# Patient Record
Sex: Female | Born: 1935 | Race: White | Hispanic: No | Smoking: Never smoker
Health system: Southern US, Community
[De-identification: ages and names within clinical notes are randomized; demographics above are authoritative.]

## PROBLEM LIST (undated history)

## (undated) DIAGNOSIS — I739 Peripheral vascular disease, unspecified: Secondary | ICD-10-CM

## (undated) DIAGNOSIS — R42 Dizziness and giddiness: Secondary | ICD-10-CM

## (undated) DIAGNOSIS — K219 Gastro-esophageal reflux disease without esophagitis: Secondary | ICD-10-CM

## (undated) DIAGNOSIS — I8390 Asymptomatic varicose veins of unspecified lower extremity: Secondary | ICD-10-CM

## (undated) DIAGNOSIS — I639 Cerebral infarction, unspecified: Secondary | ICD-10-CM

## (undated) DIAGNOSIS — H9190 Unspecified hearing loss, unspecified ear: Secondary | ICD-10-CM

## (undated) DIAGNOSIS — J189 Pneumonia, unspecified organism: Secondary | ICD-10-CM

## (undated) DIAGNOSIS — I499 Cardiac arrhythmia, unspecified: Secondary | ICD-10-CM

## (undated) DIAGNOSIS — D649 Anemia, unspecified: Secondary | ICD-10-CM

## (undated) DIAGNOSIS — Z950 Presence of cardiac pacemaker: Secondary | ICD-10-CM

## (undated) DIAGNOSIS — G629 Polyneuropathy, unspecified: Secondary | ICD-10-CM

## (undated) DIAGNOSIS — I4891 Unspecified atrial fibrillation: Secondary | ICD-10-CM

## (undated) DIAGNOSIS — C4491 Basal cell carcinoma of skin, unspecified: Secondary | ICD-10-CM

## (undated) DIAGNOSIS — E039 Hypothyroidism, unspecified: Secondary | ICD-10-CM

## (undated) HISTORY — PX: CHOLECYSTECTOMY: SHX55

## (undated) HISTORY — PX: PACEMAKER PLACEMENT: SHX43

## (undated) HISTORY — PX: APPENDECTOMY: SHX54

## (undated) HISTORY — PX: EYE SURGERY: SHX253

## (undated) HISTORY — PX: BREAST CYST EXCISION: SHX579

## (undated) HISTORY — PX: BASAL CELL CARCINOMA EXCISION: SHX1214

## (undated) HISTORY — DX: Peripheral vascular disease, unspecified: I73.9

## (undated) HISTORY — DX: Asymptomatic varicose veins of unspecified lower extremity: I83.90

## (undated) HISTORY — PX: CATARACT EXTRACTION: SUR2

## (undated) HISTORY — PX: TONSILLECTOMY: SUR1361

---

## 1998-12-04 DIAGNOSIS — I639 Cerebral infarction, unspecified: Secondary | ICD-10-CM

## 1998-12-04 HISTORY — DX: Cerebral infarction, unspecified: I63.9

## 2004-12-22 ENCOUNTER — Ambulatory Visit: Payer: Self-pay | Admitting: Otolaryngology

## 2005-03-13 ENCOUNTER — Ambulatory Visit: Payer: Self-pay | Admitting: Neurology

## 2005-05-12 ENCOUNTER — Ambulatory Visit: Payer: Self-pay | Admitting: Internal Medicine

## 2005-05-19 ENCOUNTER — Ambulatory Visit: Payer: Self-pay | Admitting: Internal Medicine

## 2005-05-30 ENCOUNTER — Ambulatory Visit: Payer: Self-pay | Admitting: Internal Medicine

## 2005-05-31 ENCOUNTER — Inpatient Hospital Stay: Payer: Self-pay | Admitting: Internal Medicine

## 2006-05-22 ENCOUNTER — Ambulatory Visit: Payer: Self-pay | Admitting: Internal Medicine

## 2007-01-18 ENCOUNTER — Ambulatory Visit: Payer: Self-pay | Admitting: Neurology

## 2007-02-04 ENCOUNTER — Ambulatory Visit: Payer: Self-pay | Admitting: Otolaryngology

## 2007-02-04 ENCOUNTER — Other Ambulatory Visit: Payer: Self-pay

## 2007-02-07 ENCOUNTER — Ambulatory Visit: Payer: Self-pay | Admitting: Otolaryngology

## 2007-05-28 ENCOUNTER — Ambulatory Visit: Payer: Self-pay | Admitting: Internal Medicine

## 2008-05-28 ENCOUNTER — Ambulatory Visit: Payer: Self-pay | Admitting: Internal Medicine

## 2008-06-15 ENCOUNTER — Ambulatory Visit: Payer: Self-pay | Admitting: Internal Medicine

## 2008-08-20 ENCOUNTER — Observation Stay: Payer: Self-pay | Admitting: Internal Medicine

## 2008-08-20 ENCOUNTER — Other Ambulatory Visit: Payer: Self-pay

## 2008-08-26 ENCOUNTER — Emergency Department: Payer: Self-pay | Admitting: Emergency Medicine

## 2008-08-27 ENCOUNTER — Encounter: Admission: RE | Admit: 2008-08-27 | Discharge: 2008-08-27 | Payer: Self-pay | Admitting: Neurology

## 2008-12-04 DIAGNOSIS — J189 Pneumonia, unspecified organism: Secondary | ICD-10-CM

## 2008-12-04 DIAGNOSIS — I499 Cardiac arrhythmia, unspecified: Secondary | ICD-10-CM

## 2008-12-04 HISTORY — DX: Cardiac arrhythmia, unspecified: I49.9

## 2008-12-04 HISTORY — DX: Pneumonia, unspecified organism: J18.9

## 2009-01-19 ENCOUNTER — Encounter: Admission: RE | Admit: 2009-01-19 | Discharge: 2009-01-19 | Payer: Self-pay | Admitting: Neurology

## 2009-06-30 ENCOUNTER — Ambulatory Visit: Payer: Self-pay | Admitting: Internal Medicine

## 2009-07-02 ENCOUNTER — Ambulatory Visit: Payer: Self-pay | Admitting: Cardiology

## 2009-07-03 ENCOUNTER — Ambulatory Visit: Payer: Self-pay | Admitting: Neurology

## 2009-07-07 ENCOUNTER — Ambulatory Visit: Payer: Self-pay | Admitting: Cardiology

## 2009-07-14 ENCOUNTER — Inpatient Hospital Stay: Payer: Self-pay | Admitting: Cardiology

## 2009-08-04 ENCOUNTER — Encounter: Admission: RE | Admit: 2009-08-04 | Discharge: 2009-08-04 | Payer: Self-pay | Admitting: Neurology

## 2009-09-02 ENCOUNTER — Encounter: Admission: RE | Admit: 2009-09-02 | Discharge: 2009-09-02 | Payer: Self-pay | Admitting: Neurology

## 2009-09-20 ENCOUNTER — Encounter: Admission: RE | Admit: 2009-09-20 | Discharge: 2009-09-20 | Payer: Self-pay | Admitting: Neurology

## 2010-01-07 ENCOUNTER — Encounter: Admission: RE | Admit: 2010-01-07 | Discharge: 2010-01-07 | Payer: Self-pay | Admitting: Neurology

## 2010-03-16 ENCOUNTER — Encounter: Admission: RE | Admit: 2010-03-16 | Discharge: 2010-03-16 | Payer: Self-pay | Admitting: Neurology

## 2010-03-30 ENCOUNTER — Encounter: Admission: RE | Admit: 2010-03-30 | Discharge: 2010-03-30 | Payer: Self-pay | Admitting: Neurology

## 2010-07-05 ENCOUNTER — Ambulatory Visit: Payer: Self-pay | Admitting: Internal Medicine

## 2010-08-02 ENCOUNTER — Ambulatory Visit: Payer: Self-pay | Admitting: Ophthalmology

## 2010-08-17 ENCOUNTER — Ambulatory Visit: Payer: Self-pay | Admitting: Ophthalmology

## 2010-09-23 ENCOUNTER — Ambulatory Visit: Payer: Self-pay | Admitting: Neurology

## 2010-10-12 ENCOUNTER — Ambulatory Visit: Payer: Self-pay | Admitting: Internal Medicine

## 2010-11-16 ENCOUNTER — Ambulatory Visit: Payer: Self-pay | Admitting: Ophthalmology

## 2011-07-07 ENCOUNTER — Ambulatory Visit: Payer: Self-pay | Admitting: Internal Medicine

## 2011-10-04 ENCOUNTER — Other Ambulatory Visit: Payer: Self-pay | Admitting: Neurology

## 2011-10-04 DIAGNOSIS — D329 Benign neoplasm of meninges, unspecified: Secondary | ICD-10-CM

## 2011-10-06 ENCOUNTER — Ambulatory Visit
Admission: RE | Admit: 2011-10-06 | Discharge: 2011-10-06 | Disposition: A | Discharge status: Home / Self Care | Payer: Medicare Other | Source: Ambulatory Visit | Attending: Neurology | Admitting: Neurology

## 2011-10-06 DIAGNOSIS — D329 Benign neoplasm of meninges, unspecified: Secondary | ICD-10-CM

## 2011-10-06 MED ORDER — IOHEXOL 300 MG/ML  SOLN
75.0000 mL | Freq: Once | INTRAMUSCULAR | Status: AC | PRN
  Administered 2011-10-06: 75 mL via INTRAVENOUS

## 2012-02-01 ENCOUNTER — Other Ambulatory Visit: Payer: Self-pay | Admitting: Neurology

## 2012-02-01 DIAGNOSIS — G519 Disorder of facial nerve, unspecified: Secondary | ICD-10-CM

## 2012-02-09 ENCOUNTER — Ambulatory Visit
Admission: RE | Admit: 2012-02-09 | Discharge: 2012-02-09 | Disposition: A | Discharge status: Home / Self Care | Payer: Medicare Other | Source: Ambulatory Visit | Attending: Neurology | Admitting: Neurology

## 2012-02-09 DIAGNOSIS — G519 Disorder of facial nerve, unspecified: Secondary | ICD-10-CM

## 2012-03-22 ENCOUNTER — Emergency Department: Payer: Self-pay | Admitting: Internal Medicine

## 2012-03-22 LAB — COMPREHENSIVE METABOLIC PANEL
Albumin: 3.4 g/dL (ref 3.4–5.0)
Alkaline Phosphatase: 96 U/L (ref 50–136)
Anion Gap: 11 (ref 7–16)
BUN: 26 mg/dL — ABNORMAL HIGH (ref 7–18)
Bilirubin,Total: 0.4 mg/dL (ref 0.2–1.0)
Chloride: 101 mmol/L (ref 98–107)
Co2: 25 mmol/L (ref 21–32)
Creatinine: 1.23 mg/dL (ref 0.60–1.30)
EGFR (Non-African Amer.): 40 — ABNORMAL LOW
Glucose: 104 mg/dL — ABNORMAL HIGH (ref 65–99)
Potassium: 4.3 mmol/L (ref 3.5–5.1)
SGOT(AST): 35 U/L (ref 15–37)
Total Protein: 7.9 g/dL (ref 6.4–8.2)

## 2012-03-22 LAB — URINALYSIS, COMPLETE
Bilirubin,UR: NEGATIVE
Glucose,UR: NEGATIVE mg/dL (ref 0–75)
Nitrite: NEGATIVE
Protein: 30
Specific Gravity: 1.035 (ref 1.003–1.030)
Squamous Epithelial: 2
WBC UR: 3 /HPF (ref 0–5)

## 2012-03-22 LAB — CBC
HGB: 14.8 g/dL (ref 12.0–16.0)
MCH: 32 pg (ref 26.0–34.0)
MCHC: 32.8 g/dL (ref 32.0–36.0)
MCV: 98 fL (ref 80–100)
Platelet: 306 10*3/uL (ref 150–440)
RBC: 4.61 10*6/uL (ref 3.80–5.20)
WBC: 8.9 10*3/uL (ref 3.6–11.0)

## 2012-07-08 ENCOUNTER — Ambulatory Visit: Payer: Self-pay | Admitting: Internal Medicine

## 2012-08-12 ENCOUNTER — Observation Stay: Payer: Self-pay

## 2012-08-12 LAB — CBC
HCT: 41.5 % (ref 35.0–47.0)
MCHC: 34.4 g/dL (ref 32.0–36.0)
RDW: 13.6 % (ref 11.5–14.5)
WBC: 7 10*3/uL (ref 3.6–11.0)

## 2012-08-12 LAB — COMPREHENSIVE METABOLIC PANEL
Alkaline Phosphatase: 114 U/L (ref 50–136)
Anion Gap: 5 — ABNORMAL LOW (ref 7–16)
Calcium, Total: 10.1 mg/dL (ref 8.5–10.1)
Co2: 28 mmol/L (ref 21–32)
EGFR (Non-African Amer.): 58 — ABNORMAL LOW
Osmolality: 276 (ref 275–301)
Potassium: 4.3 mmol/L (ref 3.5–5.1)
SGPT (ALT): 24 U/L (ref 12–78)
Sodium: 137 mmol/L (ref 136–145)

## 2012-08-12 LAB — CK TOTAL AND CKMB (NOT AT ARMC)
CK, Total: 44 U/L (ref 21–215)
CK-MB: 0.8 ng/mL (ref 0.5–3.6)
CK-MB: 0.8 ng/mL (ref 0.5–3.6)

## 2012-08-12 LAB — URINALYSIS, COMPLETE
Bacteria: NONE SEEN
Bilirubin,UR: NEGATIVE
Blood: NEGATIVE
Glucose,UR: NEGATIVE mg/dL (ref 0–75)
Protein: NEGATIVE
Specific Gravity: 1.008 (ref 1.003–1.030)
Squamous Epithelial: <1
WBC UR: <1 /HPF (ref 0–5)

## 2012-08-12 LAB — PROTIME-INR
INR: 0.8
Prothrombin Time: 11.9 secs (ref 11.5–14.7)

## 2012-08-12 LAB — TROPONIN I: Troponin-I: <0.02 ng/mL

## 2012-08-13 LAB — BASIC METABOLIC PANEL
Anion Gap: 11 (ref 7–16)
BUN: 18 mg/dL (ref 7–18)
Co2: 24 mmol/L (ref 21–32)
Creatinine: 0.96 mg/dL (ref 0.60–1.30)
EGFR (African American): >60
EGFR (Non-African Amer.): 54 — ABNORMAL LOW
Sodium: 144 mmol/L (ref 136–145)

## 2012-08-13 LAB — LIPID PANEL
HDL Cholesterol: 36 mg/dL — ABNORMAL LOW (ref 40–60)
Ldl Cholesterol, Calc: 155 mg/dL — ABNORMAL HIGH (ref 0–100)
VLDL Cholesterol, Calc: 65 mg/dL — ABNORMAL HIGH (ref 5–40)

## 2012-08-13 LAB — CBC WITH DIFFERENTIAL/PLATELET
Basophil %: 0.7 %
Eosinophil #: 0.3 10*3/uL (ref 0.0–0.7)
HCT: 39.6 % (ref 35.0–47.0)
HGB: 13.6 g/dL (ref 12.0–16.0)
Lymphocyte #: 1.5 10*3/uL (ref 1.0–3.6)
Lymphocyte %: 30.3 %
MCH: 32.1 pg (ref 26.0–34.0)
MCHC: 34.4 g/dL (ref 32.0–36.0)
MCV: 93 fL (ref 80–100)
Monocyte #: 0.8 x10 3/mm (ref 0.2–0.9)
Monocyte %: 15.6 %
Neutrophil #: 2.5 10*3/uL (ref 1.4–6.5)
Neutrophil %: 48.4 %

## 2012-08-13 LAB — TROPONIN I: Troponin-I: <0.02 ng/mL

## 2012-08-13 LAB — CK TOTAL AND CKMB (NOT AT ARMC)
CK, Total: 36 U/L (ref 21–215)
CK-MB: <0.5 ng/mL — ABNORMAL LOW (ref 0.5–3.6)

## 2012-08-30 ENCOUNTER — Inpatient Hospital Stay: Payer: Self-pay

## 2012-08-30 LAB — CK TOTAL AND CKMB (NOT AT ARMC)
CK, Total: 466 U/L — ABNORMAL HIGH (ref 21–215)
CK, Total: 471 U/L — ABNORMAL HIGH (ref 21–215)
CK-MB: 6.4 ng/mL — ABNORMAL HIGH (ref 0.5–3.6)
CK-MB: 4.9 ng/mL — ABNORMAL HIGH (ref 0.5–3.6)

## 2012-08-30 LAB — CBC WITH DIFFERENTIAL/PLATELET
Basophil %: 0.3 %
Eosinophil %: 0 %
HGB: 12.8 g/dL (ref 12.0–16.0)
Lymphocyte #: 1.4 10*3/uL (ref 1.0–3.6)
Lymphocyte %: 8.5 %
MCH: 30.8 pg (ref 26.0–34.0)
Monocyte #: 2.2 x10 3/mm — ABNORMAL HIGH (ref 0.2–0.9)
Monocyte %: 12.8 %
Neutrophil %: 78.4 %
Platelet: 265 10*3/uL (ref 150–440)
RBC: 4.16 10*6/uL (ref 3.80–5.20)
WBC: 16.9 10*3/uL — ABNORMAL HIGH (ref 3.6–11.0)

## 2012-08-30 LAB — URINALYSIS, COMPLETE
Bacteria: NONE SEEN
Blood: NEGATIVE
Glucose,UR: NEGATIVE mg/dL (ref 0–75)
Ketone: NEGATIVE
Leukocyte Esterase: NEGATIVE
Nitrite: NEGATIVE
Protein: NEGATIVE
RBC,UR: 3 /HPF (ref 0–5)
Specific Gravity: 1.008 (ref 1.003–1.030)
Squamous Epithelial: <1
WBC UR: 1 /HPF (ref 0–5)

## 2012-08-30 LAB — COMPREHENSIVE METABOLIC PANEL
Anion Gap: 6 — ABNORMAL LOW (ref 7–16)
BUN: 21 mg/dL — ABNORMAL HIGH (ref 7–18)
Bilirubin,Total: 0.7 mg/dL (ref 0.2–1.0)
Chloride: 105 mmol/L (ref 98–107)
Creatinine: 1.18 mg/dL (ref 0.60–1.30)
EGFR (African American): 48 — ABNORMAL LOW
Osmolality: 284 (ref 275–301)
Potassium: 4.6 mmol/L (ref 3.5–5.1)
SGOT(AST): 31 U/L (ref 15–37)
Sodium: 140 mmol/L (ref 136–145)
Total Protein: 7.6 g/dL (ref 6.4–8.2)

## 2012-08-30 LAB — PROTIME-INR
INR: 1
Prothrombin Time: 13.6 secs (ref 11.5–14.7)

## 2012-08-30 LAB — TSH: Thyroid Stimulating Horm: 0.974 u[IU]/mL

## 2012-08-31 LAB — CBC WITH DIFFERENTIAL/PLATELET
Basophil #: 0.1 10*3/uL (ref 0.0–0.1)
Basophil %: 0.6 %
Eosinophil #: 0.1 10*3/uL (ref 0.0–0.7)
HGB: 11.1 g/dL — ABNORMAL LOW (ref 12.0–16.0)
Lymphocyte #: 1.7 10*3/uL (ref 1.0–3.6)
Lymphocyte %: 12.6 %
MCHC: 32.3 g/dL (ref 32.0–36.0)
Monocyte #: 1.5 x10 3/mm — ABNORMAL HIGH (ref 0.2–0.9)
Neutrophil %: 75 %

## 2012-08-31 LAB — BASIC METABOLIC PANEL
BUN: 19 mg/dL — ABNORMAL HIGH (ref 7–18)
Calcium, Total: 7.9 mg/dL — ABNORMAL LOW (ref 8.5–10.1)
Co2: 23 mmol/L (ref 21–32)
Osmolality: 285 (ref 275–301)
Potassium: 4.1 mmol/L (ref 3.5–5.1)
Sodium: 141 mmol/L (ref 136–145)

## 2012-08-31 LAB — APTT: Activated PTT: 96.6 secs — ABNORMAL HIGH (ref 23.6–35.9)

## 2012-08-31 LAB — CK TOTAL AND CKMB (NOT AT ARMC)
CK, Total: 318 U/L — ABNORMAL HIGH (ref 21–215)
CK-MB: 1.9 ng/mL (ref 0.5–3.6)

## 2012-09-01 LAB — BASIC METABOLIC PANEL
Chloride: 113 mmol/L — ABNORMAL HIGH (ref 98–107)
EGFR (Non-African Amer.): 60 — ABNORMAL LOW
Glucose: 111 mg/dL — ABNORMAL HIGH (ref 65–99)
Osmolality: 290 (ref 275–301)
Potassium: 4 mmol/L (ref 3.5–5.1)
Sodium: 146 mmol/L — ABNORMAL HIGH (ref 136–145)

## 2012-09-01 LAB — CBC WITH DIFFERENTIAL/PLATELET
Basophil #: 0.1 10*3/uL (ref 0.0–0.1)
Basophil %: 0.7 %
Eosinophil %: 1.6 %
HGB: 11.5 g/dL — ABNORMAL LOW (ref 12.0–16.0)
Lymphocyte #: 1.2 10*3/uL (ref 1.0–3.6)
MCH: 32.9 pg (ref 26.0–34.0)
MCV: 95 fL (ref 80–100)
Monocyte #: 1.3 x10 3/mm — ABNORMAL HIGH (ref 0.2–0.9)
Monocyte %: 13.2 %
Neutrophil %: 71.8 %
RBC: 3.48 10*6/uL — ABNORMAL LOW (ref 3.80–5.20)

## 2012-09-02 LAB — BASIC METABOLIC PANEL
Anion Gap: 10 (ref 7–16)
BUN: 10 mg/dL (ref 7–18)
Chloride: 110 mmol/L — ABNORMAL HIGH (ref 98–107)
Creatinine: 0.84 mg/dL (ref 0.60–1.30)
EGFR (African American): >60
EGFR (Non-African Amer.): >60
Glucose: 105 mg/dL — ABNORMAL HIGH (ref 65–99)
Osmolality: 286 (ref 275–301)

## 2012-09-02 LAB — CBC WITH DIFFERENTIAL/PLATELET
Basophil %: 0.6 %
Eosinophil %: 3 %
HCT: 35.3 % (ref 35.0–47.0)
Lymphocyte #: 1.1 10*3/uL (ref 1.0–3.6)
MCH: 32.2 pg (ref 26.0–34.0)
Monocyte #: 1.2 x10 3/mm — ABNORMAL HIGH (ref 0.2–0.9)
Monocyte %: 13 %
Platelet: 291 10*3/uL (ref 150–440)
WBC: 9.3 10*3/uL (ref 3.6–11.0)

## 2012-12-16 LAB — CBC
HGB: 13.5 g/dL (ref 12.0–16.0)
MCHC: 32.3 g/dL (ref 32.0–36.0)
MCV: 95 fL (ref 80–100)
Platelet: 329 10*3/uL (ref 150–440)
RBC: 4.43 10*6/uL (ref 3.80–5.20)
RDW: 14.1 % (ref 11.5–14.5)
WBC: 16.8 10*3/uL — ABNORMAL HIGH (ref 3.6–11.0)

## 2012-12-16 LAB — TROPONIN I: Troponin-I: <0.02 ng/mL

## 2012-12-16 LAB — COMPREHENSIVE METABOLIC PANEL
Alkaline Phosphatase: 127 U/L (ref 50–136)
Anion Gap: 9 (ref 7–16)
Calcium, Total: 9.4 mg/dL (ref 8.5–10.1)
Chloride: 106 mmol/L (ref 98–107)
Creatinine: 0.98 mg/dL (ref 0.60–1.30)
Glucose: 189 mg/dL — ABNORMAL HIGH (ref 65–99)
Potassium: 5.4 mmol/L — ABNORMAL HIGH (ref 3.5–5.1)
SGOT(AST): 54 U/L — ABNORMAL HIGH (ref 15–37)
SGPT (ALT): 27 U/L (ref 12–78)
Sodium: 138 mmol/L (ref 136–145)
Total Protein: 8.2 g/dL (ref 6.4–8.2)

## 2012-12-16 LAB — CK TOTAL AND CKMB (NOT AT ARMC)
CK, Total: 91 U/L (ref 21–215)
CK-MB: <0.5 ng/mL — ABNORMAL LOW (ref 0.5–3.6)

## 2012-12-17 ENCOUNTER — Inpatient Hospital Stay: Payer: Self-pay

## 2012-12-17 LAB — TROPONIN I
Troponin-I: <0.02 ng/mL
Troponin-I: <0.02 ng/mL

## 2012-12-17 LAB — PRO B NATRIURETIC PEPTIDE: B-Type Natriuretic Peptide: 5288 pg/mL — ABNORMAL HIGH (ref 0–450)

## 2012-12-17 LAB — POTASSIUM: Potassium: 5 mmol/L (ref 3.5–5.1)

## 2012-12-18 LAB — BASIC METABOLIC PANEL
BUN: 25 mg/dL — ABNORMAL HIGH (ref 7–18)
Calcium, Total: 8.4 mg/dL — ABNORMAL LOW (ref 8.5–10.1)
Chloride: 103 mmol/L (ref 98–107)
Co2: 26 mmol/L (ref 21–32)
Potassium: 4.3 mmol/L (ref 3.5–5.1)
Sodium: 136 mmol/L (ref 136–145)

## 2012-12-18 LAB — CBC WITH DIFFERENTIAL/PLATELET
Eosinophil %: 0.5 %
HGB: 11.9 g/dL — ABNORMAL LOW (ref 12.0–16.0)
Lymphocyte #: 1.5 10*3/uL (ref 1.0–3.6)
Lymphocyte %: 9.8 %
MCHC: 34.6 g/dL (ref 32.0–36.0)
MCV: 95 fL (ref 80–100)
Monocyte #: 1.8 x10 3/mm — ABNORMAL HIGH (ref 0.2–0.9)
Monocyte %: 11.9 %
Neutrophil #: 11.9 10*3/uL — ABNORMAL HIGH (ref 1.4–6.5)
Platelet: 258 10*3/uL (ref 150–440)
RBC: 3.61 10*6/uL — ABNORMAL LOW (ref 3.80–5.20)
RDW: 14 % (ref 11.5–14.5)
WBC: 15.4 10*3/uL — ABNORMAL HIGH (ref 3.6–11.0)

## 2012-12-20 LAB — BASIC METABOLIC PANEL
Anion Gap: 6 — ABNORMAL LOW (ref 7–16)
BUN: 22 mg/dL — ABNORMAL HIGH (ref 7–18)
Calcium, Total: 8.7 mg/dL (ref 8.5–10.1)
Co2: 26 mmol/L (ref 21–32)
EGFR (Non-African Amer.): 55 — ABNORMAL LOW

## 2012-12-20 LAB — CBC WITH DIFFERENTIAL/PLATELET
Eosinophil %: 3.8 %
HGB: 11.6 g/dL — ABNORMAL LOW (ref 12.0–16.0)
MCV: 95 fL (ref 80–100)
Monocyte %: 15 %
Neutrophil #: 5.8 10*3/uL (ref 1.4–6.5)
Neutrophil %: 66.8 %
Platelet: 280 10*3/uL (ref 150–440)
RDW: 13.8 % (ref 11.5–14.5)

## 2012-12-21 LAB — BASIC METABOLIC PANEL
Creatinine: 0.87 mg/dL (ref 0.60–1.30)
Glucose: 103 mg/dL — ABNORMAL HIGH (ref 65–99)
Osmolality: 281 (ref 275–301)
Sodium: 140 mmol/L (ref 136–145)

## 2012-12-21 LAB — CBC WITH DIFFERENTIAL/PLATELET
Basophil #: 0.1 10*3/uL (ref 0.0–0.1)
Basophil %: 1.4 %
Eosinophil #: 0.3 10*3/uL (ref 0.0–0.7)
Eosinophil %: 4.3 %
HGB: 10.9 g/dL — ABNORMAL LOW (ref 12.0–16.0)
Lymphocyte #: 1.4 10*3/uL (ref 1.0–3.6)
Lymphocyte %: 20.1 %
MCH: 29.8 pg (ref 26.0–34.0)
MCHC: 31.5 g/dL — ABNORMAL LOW (ref 32.0–36.0)
MCV: 95 fL (ref 80–100)
Monocyte %: 15.7 %
Neutrophil %: 58.5 %
Platelet: 303 10*3/uL (ref 150–440)
WBC: 7.1 10*3/uL (ref 3.6–11.0)

## 2012-12-22 LAB — CULTURE, BLOOD (SINGLE)

## 2012-12-23 LAB — BASIC METABOLIC PANEL
Anion Gap: 7 (ref 7–16)
Creatinine: 0.99 mg/dL (ref 0.60–1.30)
EGFR (African American): 59 — ABNORMAL LOW
Glucose: 128 mg/dL — ABNORMAL HIGH (ref 65–99)
Osmolality: 278 (ref 275–301)
Sodium: 137 mmol/L (ref 136–145)

## 2012-12-25 LAB — CBC WITH DIFFERENTIAL/PLATELET
Basophil #: 0.1 10*3/uL (ref 0.0–0.1)
Eosinophil %: 2.7 %
HCT: 34.3 % — ABNORMAL LOW (ref 35.0–47.0)
HGB: 11.4 g/dL — ABNORMAL LOW (ref 12.0–16.0)
Lymphocyte #: 1.3 10*3/uL (ref 1.0–3.6)
Lymphocyte %: 13.8 %
MCHC: 33.2 g/dL (ref 32.0–36.0)
MCV: 94 fL (ref 80–100)
Monocyte %: 11.1 %
Neutrophil #: 6.5 10*3/uL (ref 1.4–6.5)
Neutrophil %: 70.9 %
Platelet: 450 10*3/uL — ABNORMAL HIGH (ref 150–440)
RBC: 3.67 10*6/uL — ABNORMAL LOW (ref 3.80–5.20)
RDW: 14.1 % (ref 11.5–14.5)
WBC: 9.2 10*3/uL (ref 3.6–11.0)

## 2012-12-25 LAB — BASIC METABOLIC PANEL
Anion Gap: 10 (ref 7–16)
Co2: 23 mmol/L (ref 21–32)
Creatinine: 1.24 mg/dL (ref 0.60–1.30)
EGFR (African American): 45 — ABNORMAL LOW
EGFR (Non-African Amer.): 39 — ABNORMAL LOW
Osmolality: 283 (ref 275–301)
Potassium: 3.7 mmol/L (ref 3.5–5.1)
Sodium: 137 mmol/L (ref 136–145)

## 2012-12-30 ENCOUNTER — Other Ambulatory Visit: Payer: Self-pay

## 2012-12-30 LAB — PRO B NATRIURETIC PEPTIDE: B-Type Natriuretic Peptide: 7984 pg/mL — ABNORMAL HIGH (ref 0–450)

## 2013-06-20 ENCOUNTER — Ambulatory Visit: Payer: Self-pay

## 2013-07-09 ENCOUNTER — Ambulatory Visit: Payer: Self-pay

## 2013-12-04 HISTORY — PX: ANGIOPLASTY: SHX39

## 2014-01-07 ENCOUNTER — Ambulatory Visit: Payer: Self-pay

## 2014-04-28 ENCOUNTER — Ambulatory Visit (INDEPENDENT_AMBULATORY_CARE_PROVIDER_SITE_OTHER): Payer: 59 | Admitting: Podiatry

## 2014-04-28 ENCOUNTER — Ambulatory Visit (INDEPENDENT_AMBULATORY_CARE_PROVIDER_SITE_OTHER): Payer: 59

## 2014-04-28 ENCOUNTER — Encounter: Payer: Self-pay | Admitting: Podiatry

## 2014-04-28 VITALS — BP 125/77 | HR 87 | Resp 16 | Ht 64.0 in | Wt 154.0 lb

## 2014-04-28 DIAGNOSIS — G589 Mononeuropathy, unspecified: Secondary | ICD-10-CM

## 2014-04-28 DIAGNOSIS — G629 Polyneuropathy, unspecified: Secondary | ICD-10-CM

## 2014-04-28 DIAGNOSIS — L84 Corns and callosities: Secondary | ICD-10-CM

## 2014-04-28 DIAGNOSIS — M7750 Other enthesopathy of unspecified foot: Secondary | ICD-10-CM

## 2014-04-28 DIAGNOSIS — M898X9 Other specified disorders of bone, unspecified site: Secondary | ICD-10-CM

## 2014-04-28 NOTE — Progress Notes (Signed)
   Subjective:    Patient ID: [name], female    DOB: 01-02-1923, 80 y.o.   MRN: [id number]  HPI Comments: Both of my fee feel like rubber. It hurts to bend them. This has been going on for 2-3 yrs. It came up slowly and getting worse. It bothered me in the night time. i soak in epsom salt.   Foot Pain Associated symptoms include fatigue, nausea, numbness and a rash.      Review of Systems  Constitutional: Positive for fatigue.  HENT: Positive for hearing loss and trouble swallowing.   Eyes: Positive for redness.  Respiratory: Positive for shortness of breath.        Difficulty breathing  Cardiovascular: Positive for leg swelling.       Calf pain with walking  Gastrointestinal: Positive for nausea and constipation.  Endocrine:       Increase urination  Genitourinary: Positive for frequency.  Musculoskeletal:       Back pain Difficulty walking Muscle pain  Skin: Positive for rash.       Thick scars  Neurological: Positive for dizziness and numbness.  Hematological: Bruises/bleeds easily.  Psychiatric/Behavioral: Positive for confusion.  All other systems reviewed and are negative.      Objective:   Physical Exam        Assessment & Plan:

## 2014-04-28 NOTE — Progress Notes (Signed)
Subjective:     Patient ID: [name], female   DOB: [birth year], 80 y.o.   MRN: [id number]  Foot Pain   patient states I have a rubbery feeling in the bottom of both my feet and my legs to get tired and at nighttime they hurt me quite a bit if I'm on them too much. This is been getting gradually worse over the last couple years and I do see a vascular doctor for migraines   Review of Systems  All other systems reviewed and are negative.      Objective:   Physical Exam  Nursing note and vitals reviewed. Constitutional: She is oriented to person, place, and time.  Musculoskeletal: Normal range of motion.  Neurological: She is oriented to person, place, and time.  Skin: Skin is warm.   I noted diminishment of vascular status the DP and PT pulses of both feet and it neurological intact but diminished Sharp toe and vibratory. Muscle strength was found to be diminished and range of motion was adequate with depression of the arch of both feet and a keratotic lesion it's painful inside of the fifth toe left foot that she cannot take care of herself     Assessment:     Possibility for vascular disease versus low-grade neuropathic symptoms and exostosis with pain and callus formation fifth left    Plan:     H&P and x-rays reviewed. Debridement of lesion on left with padding and instructed on possibility for vascular evaluation if symptoms were to be persistent. She is to see the vascular doctor soon and promises she will have him check her circulation. I offered to make her an appointment and she states she wants to take care of it herself

## 2014-05-13 ENCOUNTER — Encounter: Payer: Self-pay | Admitting: *Deleted

## 2014-07-21 ENCOUNTER — Ambulatory Visit: Payer: Self-pay

## 2014-11-29 DIAGNOSIS — S6990XA Unspecified injury of unspecified wrist, hand and finger(s), initial encounter: Secondary | ICD-10-CM | POA: Insufficient documentation

## 2014-12-30 DIAGNOSIS — I480 Paroxysmal atrial fibrillation: Secondary | ICD-10-CM | POA: Insufficient documentation

## 2015-03-23 NOTE — Consult Note (Signed)
PATIENT NAME:  [name], [name] MR#:  [id number] DATE OF BIRTH:  27-May-1923  DATE OF CONSULTATION:  08/30/2012  REFERRING PHYSICIAN: Dr Ola Spurr.   CONSULTING PHYSICIAN: Corey Skains, MD PRIMARY CARE PHYSICIAN: Dr Ola Spurr  REASON FOR CONSULTATION: Atrial fibrillation with rapid ventricular rate, hypertension, hyperlipidemia, previous stroke, and heart block in the past, needing further medical management and treatment options.   CHIEF COMPLAINT: "I had a stroke."   HISTORY OF PRESENT ILLNESS: This is an 81 year old female with known complete heart block in the past, status post pacemaker placement who has had  showing no evidence of significant need in adjustments for which the patient has done fairly well. The patient has had new onset of pneumonia. She had shortness of breath, weakness, and loss of her voice. With that she has had atrial fibrillation with rapid ventricular rate and lower blood pressure. Now she is having some shortness of breath with this issue but no evidence of significant chest discomfort. The patient does have a stroke as of 2 weeks ago, possibly consistent with a thrombotic stroke on aspirin. The patient also has hyperlipidemia on appropriate medication management. She has had hypertension, on beta-blocker in the past.   REVIEW OF SYSTEMS: The remainder of her review of systems negative for vision change, ringing in the ears, hearing loss, cough, congestion, heartburn, nausea, vomiting, diarrhea, bloody stools, stomach pain, extremity pain, leg weakness, cramping in the buttocks, known blood clots, headaches, blackouts, dizzy spells, nosebleeds, congestion, trouble swallowing, frequent urination, urination at night, muscle weakness, numbness, anxiety, depression, skin lesions, or skin rashes.   PAST MEDICAL HISTORY:  1. Transient ischemic attack and stroke.  2. Hyperlipidemia.  3. Heart block.  4. Hypertension.  5. Atrial fibrillation.   FAMILY HISTORY: No  family members with early onset of cardiovascular disease or hypertension.   SOCIAL HISTORY: Currently denies alcohol or tobacco use.   ALLERGIES: She has no known drug allergies.   CURRENT MEDICATIONS: As listed.   PHYSICAL EXAMINATION:  VITAL SIGNS: Blood pressure is 132/68 bilaterally, heart rate 88 upright, reclining, and irregular.   GENERAL: She is a well-appearing female in no acute distress.   HEENT: No icterus, thyromegaly, ulcers, hemorrhage, or xanthelasma.   HEART: Irregularly irregular with normal S1 and S2 without murmur, gallop, or rub. PMI is diffuse. Carotid upstroke normal without bruit. Jugular venous pressure is normal.   LUNGS: Lungs have few basilar crackles with normal respirations.   ABDOMEN: Soft, nontender, without hepatosplenomegaly or masses. Abdominal aorta is normal size without bruit.   EXTREMITIES: Show 2+ radial, femoral, dorsal pedal pulses with no lower extremity edema, cyanosis, clubbing, or ulcers.   NEUROLOGIC: She is oriented to time, place, and person with normal mood and affect.   ASSESSMENT: An 81 year old female with hypertension, hyperlipidemia, heart block, previous transient ischemic attack/stroke with acute onset of atrial fibrillation with rapid ventricular rate needing further medical management and treatment options.   RECOMMENDATIONS:  1. Continue heart rate control with beta-blocker Cardizem combination for further risk reduction in tachycardia and congestive heart failure.  2. Anticoagulation with heparin and further anticoagulation with oral agent thereafter due to high CHADS score.  3. Hypertension, controlled with beta-blocker.  4. Echocardiogram for LV systolic dysfunction, valvular heart disease.  5. Hyperlipidemia with treatment options with a goal LDL below 70 mg/dL.  6. Further treatment options after above.     ____________________________ Corey Skains, MD bjk:vtd D: 08/30/2012 17:56:41 ET T: 08/31/2012  09:26:47 ET JOB#: [id number]  cc:  Corey Skains, MD, <Dictator> Corey Skains MD ELECTRONICALLY SIGNED 09/04/2012 8:46

## 2015-03-23 NOTE — Discharge Summary (Signed)
PATIENT NAME:  [name], [name] MR#:  [id number] DATE OF BIRTH:  1923-03-07  DATE OF ADMISSION:  08/30/2012 DATE OF DISCHARGE:  09/02/2012  PRIMARY CARE PHYSICIAN: Adrian Prows, MD  CARDIOLOGIST: Isaias Cowman, MD  DISCHARGE DIAGNOSES:  1. Right middle lobe pneumonia.  2. Atrial fibrillation with rapid ventricular response.  3. Hypertension.  4. Recent transient ischemic attack.  5. Sick sinus syndrome status post pacemaker placement in the past.  6. Thyroid nodule being worked up by Dr. Gabriel Carina.   HISTORY OF PRESENT ILLNESS: Please see full note from 08/30/2012.  Briefly, she is 65 and had a recent admission for transient ischemic attack, presenting with left-sided hand weakness. She had no evidence of atrial fibrillation during that admission. However, she was complaining of palpitations as an outpatient. She had her pacemaker adjusted several days prior to admission. On the day of admission, she had had one to two days of palpitations with her heart rate up to the 140s. She felt very lightheaded and had coolness and clamminess. She also had a cough and sore throat and hoarse voice for one to two days and fevers. She was found to be in atrial fibrillation with rapid ventricular response and to have a right middle lobe pneumonia on her chest x-ray. She was admitted for IV antibiotics and rate control.   HOSPITAL COURSE:  1. Right middle lobe pneumonia. She was treated with ceftriaxone and azithromycin. On admission she had a white count that is elevated, however, it has improved. She has been afebrile since admission. She will be transitioned to levofloxacin to complete a total of a 10 day course.  2. Atrial fibrillation with rapid ventricular response in a patient with underlying sick sinus syndrome and a pacemaker in place. She was continued on her oral metoprolol and added Cardizem. Dr. Nehemiah Massed saw her for cardiology. She had had a recent echocardiogram several weeks ago for  evaluation of her transient ischemic attack. She was started on heparin and then Pradaxa for stroke prevention as her CHADS score was 4. She was decreased from 325 mg of an aspirin to 81 mg a day. She will follow up with Dr. Saralyn Pilar in cardiology.  3. Thyroid nodule. This was to be biopsied this week by Dr. Gabriel Carina; however, given the acute illness we will defer this for several weeks. At the time of biopsy, she will need to have her Pradaxa held for three days.   DISCHARGE MEDICATIONS.  1. Levofloxacin 500 mg once a day for six days.  2. Metoprolol 25 mg twice a day.  3. Cardizem CD 240 once a day.  4. Pradaxa 150 mg twice a day.  5. Aspirin 81 mg once a day.  6. Tussionex 5 mL every 12 hours p.r.n. cough.  7. Tylenol p.r.n.  8. Fluticasone nasal spray once a day.  9. Synthroid 50 mcg once a day.  10. Calcium with vitamin D one tablet twice a day.  11. Multivitamin.  12. Glucosamine.  13. PreserVision oral tablets.  14. Fish oil.  15. Magnesium gluconate.  16. Tramadol 50 mg as needed.  17. Losartan 50 mg once a day.   DISCHARGE DIET: Cardiac diet, regular consistency.   DISCHARGE ACTIVITY: As tolerated.   DISCHARGE FOLLOWUP/INSTRUCTIONS: The patient is to followup within 7 to 10 days with Dr. Ola Spurr and later this week with Dr. Saralyn Pilar. She is to return if she develops any         new symptoms or especially recurrent palpitations or fevers. She  will need a follow-up chest x-ray in two to three weeks to ensure resolution of the abnormalities.  TIME SPENT: 38 minutes. ____________________________ Cheral Marker. Ola Spurr, MD dpf:slb D: 09/02/2012 15:24:22 ET T: 09/03/2012 11:03:50 ET JOB#: [id number]  cc: Cheral Marker. Ola Spurr, MD, <Dictator> Isaias Cowman, MD Davis MD ELECTRONICALLY SIGNED 09/10/2012 21:44

## 2015-03-23 NOTE — H&P (Signed)
PATIENT NAME:  [name], [name] MR#:  [id number] DATE OF BIRTH:  [birth year]  DATE OF ADMISSION:  08/12/2012  ADMITTING PHYSICIAN: Dr. Gladstone Lighter  PRIMARY CARE PHYSICIAN: Dr. Andrey Farmer and she will be seeing Dr. Ola Spurr as Dr. Mable Fill has retired.    CHIEF COMPLAINT: Left arm weakness.   HISTORY OF PRESENT ILLNESS: [name] is a very pleasant 81 year old Caucasian female with past medical history significant for hypertension, hyperlipidemia, sick sinus syndrome status post dual-chamber pacemaker placement and known history of meningioma presents to the hospital secondary to left arm weakness that started this morning. Patient was in her normal state of health this morning and was trying to sort out some letters and she couldn't use her left arm to hold onto the papers and was concerned so woke her husband up and they came to the ER. She took aspirin and brought to the hospital. Her symptoms have resolved and currently her strength is normal in all four extremities. According to family she also had mild left facial droop when the symptoms started this morning which now resolved too. Patient has not had prior episodes of transient ischemic attack or stroke other than she had right ear hearing loss which was secondary to arterial blood clot. She denies any chest pain, nausea, vomiting, slurred speech or any other symptoms associated with that.   PAST MEDICAL HISTORY:  1. Hypertension.  2. Vertigo syndrome.  3. Hyperlipidemia, not on medications. 4. 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  5. Osteoporosis.  6. Chronic allergies.  7. Spinal stenosis.  8. Sick sinus syndrome status post pacemaker.  9. Postherpetic neuralgia.   10. Meningioma.   PAST SURGICAL HISTORY:  1. Cataract surgery.  2. Appendectomy.  3. Cholecystectomy.  4. Tonsillectomy.  5. Biopsy of breast cyst.  6. Left upper and lower eyelid plastic surgery.  7. Skin cancer removed from nose.   ALLERGIES TO MEDICATIONS:  Sulfa. She is also intolerant to Flagyl, Monistat.   CURRENT MEDICATIONS AT HOME:  1. Aspirin 81 mg p.o. daily.  2. Calcium plus vitamin D 600 mg/200 international units 1 tablet p.o. b.i.d.  3. Fish oil 1000 mg p.o. t.i.d.  4. Flonase 50 mcg one spray daily.  5. Glucosamine 500 mg p.o. daily.  6. Losartan 50 mg p.o. daily.  7. Magnesium gluconate 250 mg p.o. b.i.d.  8. Metoprolol 25 mg p.o. b.i.d.  9. Multivitamin 1 tablet p.o. daily.  10. PreserVision 1 tablet p.o. b.i.d.  11. Imdur 50 mcg p.o. daily.  12. Tramadol 25 mg p.o. every six hours p.r.n.  13. Tylenol 500 mg q. 6-8 hours p.r.n. for pain.    SOCIAL HISTORY: Lives at home with husband. Used to do clerical work, currently retired. No smoking or alcohol use.   FAMILY HISTORY: Mom with atherosclerosis, died in her late 21s and father had lung cancer, died in late 13s.    REVIEW OF SYSTEMS: CONSTITUTIONAL: No fever, fatigue, or weakness. EYES: No blurred vision, double history. History of cataract status post surgery. No inflammation or glaucoma. ENT: No tinnitus, ear pain. Positive for right ear hearing loss after arterial clot. No epistaxis or discharge. RESPIRATORY: No cough, wheeze, hemoptysis, or chronic obstructive pulmonary disease. CARDIOVASCULAR: No chest pain, orthopnea, edema, arrhythmia, palpitations or syncope. GASTROINTESTINAL: No nausea, vomiting, diarrhea, abdominal pain, hematemesis or melena. GENITOURINARY: Increased frequency of urination. No dysuria, hematuria, or incontinence. ENDOCRINE: No polyuria, nocturia, thyroid problems, heat or cold intolerance. HEMATOLOGY: No anemia, easy bruising or bleeding. SKIN: No acne,  rash, or lesions. MUSCULOSKELETAL: No neck, back, shoulder pain, arthritis or gout. NEUROLOGIC: Positive for transient ischemic attack. No CVA, tremors. Also vascular history of vertigo. PSYCH: No anxiety, insomnia, or depression.  PHYSICAL EXAMINATION:  VITAL SIGNS: Temperature 96.7 degrees Fahrenheit,  pulse 86, respirations 18, blood pressure 212/102, pulse oximetry 95% on room air.   GENERAL: Well built, well nourished female lying in bed, not in any acute distress.   HEENT: Normocephalic, atraumatic. Pupils equal, round, reacting to light. There are post surgical pupils. Anicteric sclera. Extraocular movements intact. Oropharynx clear. No erythema, mass or exudates.   NECK: Supple. No thyromegaly, JVD, or carotid bruits. No lymphadenopathy.   LUNGS: Clear to auscultation bilaterally. No wheeze or crackles. No use of accessory muscles for breathing.   CARDIOVASCULAR: S1, S2 regular rate and rhythm. No murmurs, rubs, or gallops.   ABDOMEN: Soft, nontender, nondistended. No hepatosplenomegaly. Normal bowel sounds.   EXTREMITIES: No pedal edema. No clubbing or cyanosis. 2+ dorsalis pedis pulses palpable bilaterally.    SKIN: No acne, rash, or lesions.   LYMPHATICS: No cervical or inguinal lymphadenopathy.   NEUROLOGIC: Cranial nerves intact. No focal motor or sensory deficits. Strength is 5/5 in all four extremities. There is no facial droop and normal sensation in all four extremities, reflexes, knee jerk, biceps are 1+. Plantars are downgoing.   LABORATORY, DIAGNOSTIC AND RADIOLOGICAL DATA:  WBC 7.0, hemoglobin 14.3, hematocrit 41.5, platelet count 248.   Sodium 137, potassium 4.3, chloride 104, bicarbonate 28, BUN 20, creatinine 0.90, glucose 92, calcium 10.1, ALT 24, AST 35, alkaline phosphatase 114, total bilirubin 0.4, albumin 3.4, INR 0.8. Troponin less than 0.02. Urinalysis negative for any infection. CK, CK-MB within normal limits. CT of the head showing age-related diffuse cerebral and cerebellar atrophy, calcified nodule present along right correction, left frontal cerebral convexity most compatible with a meningioma which the patient has known history of.   EKG showing sinus rhythm with some PACs, heart rate of 69.    ASSESSMENT AND PLAN: 81 year old female with past medical  history of hypertension, hyperlipidemia, sick sinus syndrome status post pacemaker comes in with transient left arm weakness which resolved now. 1. Likely episode of transient ischemic attack. Will admit to telemetry under observation. Cannot get MRI due to her pacemaker. Will do neuro checks. Continue her aspirin. Check lipid profile and add statin. Also do carotid Doppler's and echocardiogram.  2. Hypertension, elevated, could be secondary to anxiety, stress or from her transient ischemic attack. Will do IV hydralazine p.r.n. to prevent rapid drop in blood pressure. Continue metoprolol and losartan.  3. Hyperlipidemia. Will check LDL level and start her on statin.  4. Sinus syndrome status post pacemaker. She follows up with Dr. Saralyn Pilar. Appears stable. Follow up echo. 5. DVT prophylaxis. Placed on Lovenox  6. CODE STATUS: FULL CODE.   TIME SPENT ON ADMISSION: 50 minutes.   ____________________________ Gladstone Lighter, MD rk:cms D: 08/12/2012 14:58:54 ET T: 08/12/2012 15:47:24 ET JOB#: [id number]  cc: Gladstone Lighter, MD, <Dictator> Cheral Marker. Ola Spurr, MD  Gladstone Lighter MD ELECTRONICALLY SIGNED 08/13/2012 13:28

## 2015-03-23 NOTE — H&P (Signed)
PATIENT NAME:  [name], STOERMER MR#:  [id number] DATE OF BIRTH:  1922-12-08  DATE OF ADMISSION:  08/30/2012  PRIMARY CARE PHYSICIAN:  Dr. Ola Spurr.   CARDIOLOGIST: Dr. Saralyn Pilar.   CHIEF COMPLAINT: Dizziness, palpitations and clamminess.   HISTORY OF PRESENT ILLNESS:  This is a pleasant 81 year old female who was in good health until she recently had an admission for a transient ischemic attack. She had workup at that time including echocardiogram, carotid Doppler's and telemetry which were unrevealing. She does have a history of sick sinus syndrome and has a pacemaker in place. She had her pacemaker recently adjusted just a few days ago given some ongoing issues of dizziness.   Today she presents with ongoing palpitations. Heart rate is up to the 140s, clamminess, lightheadedness and URI symptoms. She has had some fevers as well. She denies any chest pain or swelling in her legs. She has had no nausea, vomiting, diarrhea or dysuria. She does not drink alcohol or smoke.   PAST MEDICAL HISTORY:   1. Sick sinus syndrome status post pacemaker complicated by a pneumothorax.  2. Hyperlipidemia.  3. Hypertension. 4. History of meningioma.  5. Transient ischemic attack in September 2013 presenting with left arm weakness. 6. Hearing loss, right ear.  7. Osteoporosis.  8. Chronic allergies.  9. Post herpetic neuralgia.  10. Thyroid nodule diagnosed. Carotid Doppler's done last month. Being followed by Dr. Gabriel Carina as an outpatient and pending upcoming thyroid biopsy.   PAST SURGICAL HISTORY:    1. Cataract surgery. 2. Appendectomy. 3. Cholecystectomy. 4. Tonsillectomy. 5. Biopsy of breast.  6. Skin cancer removal.  7. Eyelid plastic surgery.  8. Postop pneumothorax treatment.   ALLERGIES: She is allergic to sulfa and intolerant to Flagyl and Monistat.   CURRENT MEDICATIONS: 1. Aspirin 325 once a day. This was recently increased.  2. Synthroid 50 micrograms once daily.  3. Fluticasone  nasal spray two sprays each nostril once daily.  4. Magnesium 250 milligrams two tablets once daily.  5. Red rice yeast.  6. Tramadol 50 milligrams as needed. 7. Fluconazole 200 milligrams once daily.  8. Detrol 1 milligram twice daily.  9. Losartan 50 milligrams once daily. 10. Metoprolol 25 milligrams once daily.   FAMILY HISTORY:  Her mother died in her 59s of arteriosclerosis and her father had lung cancer in his 1s.   SOCIAL HISTORY: She lives at home with her husband. 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 No smoking or drinking.   REVIEW OF SYSTEMS:  Eleven systems reviewed and negative except as per history of present illness.    PHYSICAL EXAMINATION:   VITAL SIGNS: Weight 165, temperature 99.2, pulse varied between 130 and 160. Blood pressure 100/60, sat 96% on room air.   GENERAL: She is an uncomfortable woman in some acute distress. Her skin is cool and clammy.   HEENT: Her pupils are equal, round and reactive to light and accommodation. Extraocular movements are intact. Sclera anicteric. Oropharynx clear.   NECK: Supple.   HEART: Tachy and irregular.   LUNGS: Clear.   ABDOMEN: Soft, nontender, nondistended. No hepatosplenomegaly.   EXTREMITIES: No clubbing, cyanosis or edema.   NEUROLOGIC: Nonfocal with normal strength 5/5 bilateral upper extremities and lower extremities. Cranial nerves 2-12 intact.   SKIN: She has bruising on her left shin.   LABORATORY, RADIOLOGICAL AND DIAGNOSTIC DATA:  Pending, however, she had an EKG done which showed atrial fibrillation with rapid ventricular response.   IMPRESSION: 81 year old with recent transient  ischemic attack and a history of sick sinus syndrome and ongoing workup for thyroid nodule. She now presents with URI symptoms and what appears to be atrial fibrillation with a rapid ventricular response. Her blood pressure is low and she is cool and clammy. She is very symptomatic from this. She did recently have her  pacemaker adjusted.   PLAN: 1. We will admit the patient to telemetry.  2. We will attempt rate control by giving 5 milligrams IV Lopressor now and increasing her oral Lopressor to 25 milligrams twice daily giving the first dose as soon as she arrives on the floor. 3. Given the recent transient ischemic attack and the fact that she may be having embolic events, I will place her on heparin pending cardiology evaluation.  4. Will consult cardiology for further management.  5. Will check cardiac panels.  6. Will check a D-Dimer although she is low risk for a PE as she has had no recent travel, no leg edema or no personal or family history of blood clots.   7. Will continue her workup for etiology of atrial fib with CBC, a TSH, urinalysis, comprehensive metabolic panel.  8. URI. Will check a chest x-ray. We will place the patient on Tussionex and Azithromycin.  9. The patient is FULL CODE.  10. Deep venous thrombosis will be with the heparin she is being treated for the atrial fib with.  11. We will also place her on gastrointestinal prophylaxis given the anticoagulation with Pantoprazole.   TIME SPENT: Over 40 minutes.   ____________________________ Cheral Marker. Ola Spurr, MD dpf:ap D: 08/30/2012 13:42:41 ET T: 08/30/2012 14:18:04 ET JOB#: [id number]  cc: Cheral Marker. Ola Spurr, MD, <Dictator> DAVID Ola Spurr MD ELECTRONICALLY SIGNED 09/10/2012 21:44

## 2015-03-26 NOTE — Consult Note (Signed)
Chief Complaint:   Subjective/Chief Complaint Modified barium swallow and speech evaluation noted. Will see her in my office next week (orders written) and perform an EGD as OP for further evaluation. No further IP recommendations. Will sign off. Please call me if needed. Thanks.   Electronic Signatures: Jill Side (MD)  (Signed 16-Jan-14 17:09)  Authored: Chief Complaint   Last Updated: 16-Jan-14 17:09 by Jill Side (MD)

## 2015-03-26 NOTE — H&P (Signed)
PATIENT NAME:  [name], WNEK MR#:  [id number] DATE OF BIRTH:  August 25, 1923  DATE OF ADMISSION:  12/17/2012  REFERRING PHYSICIAN: Conni Slipper, MD  PRIMARY CARE PHYSICIAN: Adrian Prows, MD  PRIMARY CARDIOLOGIST: Isaias Cowman, MD  CHIEF COMPLAINT: Chest pain.   HISTORY OF PRESENT ILLNESS: This is an 81 year old female with significant past medical history of arrhythmia status post dual chamber pacemaker in [id number] complicated by pneumothorax, history of hypertension, hyperlipidemia, atrial fibrillation on Pradaxa, anxiety and TIA who presents with complaints of chest pain. The patient reports she started to have chest pain in the last 24 hours, described it as right-sided and radiating to the shoulder and into the right arm. As well she was complaining of shortness of breath and cough. The patient denies any coffee-ground emesis, any hemoptysis, any recent travel or leg swelling. The patient is known to have history of atrial fibrillation, on Pradaxa. The patient denies any previous episodes of such chest pain. The patient reports her chest pain is currently improved. The patient had EKG done which did show her to be in a-flutter. The patient had troponin done with the first set being negative. As well the patient had d-dimer done which was within normal limits. The patient had chest x-ray done which did show right middle lung infiltrate. As well, she had significant leukocytosis of 16.8. Hospitalist service was requested to admit the patient to rule out cardiac origin of her chest pain, as well to treat her pneumonia. The patient was started on Rocephin and Zithromax after blood cultures were done.   PAST MEDICAL HISTORY: 1. Sick sinus syndrome status-post pacemaker complicated by pneumothorax.  2. Hyperlipidemia.  3. Hypertension.  4. History of meningioma.  5. Transient ischemic attack in 2013, presenting with left arm weakness.  6. Hearing loss in the right ear.  7. Osteoporosis.   8. Chronic allergies.  9. Post herpetic neuralgia. 10. Thyroid nodule.   PAST SURGICAL HISTORY: 1. Cataract surgery.  2. Appendectomy.  3. Cholecystectomy.  4. Tonsillectomy.  5. Biopsy of the breast.  6. Skin cancer removed.  7. Eyelid plastic surgery.  8. Postop pneumothorax treatment.   ALLERGIES: SULFA, FLAGYL AND MONISTAT.   HOME MEDICATIONS: 1. Aspirin 81 mg oral daily.  2. Fish oil. 3. Glucosamine.  4. Synthroid 50 mcg oral daily.  5. Vitamin 1 tablet daily.  6. Fluticasone spray 2 sprays to both nostrils daily.  7. Magnesium 250 mg 2 tablets daily.  8. PreserVision administered 2 drops into both eyes daily.  9. Tylenol as needed.  10. Tramadol 50 mg 1/2 tablet 4 times a day as needed.  11. Calcium 600 mg twice a day.  12. Detrol 1 mg 2 times a day.  13. Losartan 50 mg oral daily.  14. Pradaxa 150 mg twice a day.  15. Cardizem CD extended-release 24 daily.  16. Tussionex as needed.  17. Metoprolol 25 mg 2 times a day.  18. Multivitamin 1 tablet daily.   FAMILY HISTORY: Mother died of atherosclerosis, father of lung cancer.   SOCIAL HISTORY: Lives at home with her husband. 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 No history of smoking or drinking.   REVIEW OF SYSTEMS:  CONSTITUTIONAL: The patient denies any fever or chills. Complains of weakness and fatigue.   EYES: Denies blurry vision, double vision, pain or inflammation.   ENT: Denies tinnitus, ear pain or epistaxis. Has right ear hearing loss.   RESPIRATORY: Has complaints of cough and dyspnea. Denies any hemoptysis or any  wheezing.   CARDIOVASCULAR: Complains of chest pain. Denies any edema, arrhythmia, palpitations or syncope.   GASTROINTESTINAL: Denies nausea, vomiting, diarrhea, abdominal pain, hematemesis or melena.   GENITOURINARY: Denies dysuria, hematuria or renal colic.  ENDOCRINE: Denies polyuria, polydipsia, heat or cold intolerance. Has history of thyroid nodule.   HEMATOLOGY: Denies  anemia, easy bruising or bleeding diathesis.   INTEGUMENTARY: Denies acne, rash or skin lesions.   MUSCULOSKELETAL: Complains of back pain. Denies any gout, cramps or swelling.  NEUROLOGIC: Denies numbness, dysarthria, epilepsy, tremors, vertigo, ataxia, seizures or memory loss.   PSYCHIATRIC: Denies insomnia, bipolar disorder, depression, substance or alcohol abuse, or schizophrenia.   PHYSICAL EXAMINATION:  VITAL SIGNS: Temperature 98.1, pulse 78, respiratory rate 22, blood pressure 126/67 and saturating 98% on oxygen.   GENERAL: Elderly female who lies comfortable in bed, in no apparent distress.   HEENT: Head atraumatic, normocephalic. Pupils equal and reactive to light. Pink conjunctivae. Anicteric sclerae. Moist oral mucosa.   NECK: Supple. No thyromegaly. No JVD.   PULMONARY: Good air entry bilaterally with right lung rales. No wheezing or rhonchi. The patient has reproducible palpation on the chest, on the right and mid sternal area, that resembles her presenting chest pain upon presentation.   CARDIOVASCULAR: S1, S2 heard. No rubs, murmurs or gallops. Has irregular, irregular rhythm.   ABDOMEN: Soft, nontender and nondistended. Bowel sounds present.   EXTREMITIES: No edema, no clubbing and no cyanosis. Pulses felt bilaterally.   PSYCHIATRIC: Appropriate affect, awake and alert x 3. Intact judgment and insight.   NEUROLOGIC: Cranial nerves grossly intact. Motor 5 out of 5. No focal deficits.   SKIN: Warm and dry. Normal skin turgor.  PERTINENT LABS/RADIOLOGIC STUDIES: Glucose 189, BUN 19, creatinine 0.98, sodium 138, potassium 5.4, chloride 106 and CO2 23. Troponin less than 0.02. CO2 23. White blood cell 15.8, hemoglobin 13.5, hematocrit 41.9 and platelets 329. D-dimer 0.31.   EKG is showing a-flutter.  Chest x-ray, initial reading by ED, is showing right middle lobe infiltrate. Awaiting official reading.   ASSESSMENT AND PLAN:  1. Chest pain. Appears to be  musculoskeletal of quality, reproducible by palpation, unlikely cardiac. The patient had negative first set of cardiac enzymes. We will continue to cycle her troponins, already received aspirin. As well it is unlikely to be related to PE as she has no history of recent travel, she is already on full anticoagulation with Pradaxa and her d-dimer is within normal limits.  2. Pneumonia. The patient has right mid lung pneumonia. We will start her on Rocephin and Zithromax.  3. A-fibrillation/a-flutter. Appears to be well controlled at this point. We will continue the patient on metoprolol and Cardizem and we will continue her Pradaxa.  4. Hyperkalemia. We will hold the patient's losartan, we will give her one dose of Kayexalate and will recheck potassium in a.m.  5. Hypertension. Acceptable. Continue home medication. We will resume losartan when potassium level improves.  6. DVT prophylaxis. The patient is on full anticoagulation with Pradaxa for her a-fib.   7. GI prophylaxis. We will start her on Protonix.  CODE STATUS: FULL CODE.   TOTAL TIME SPENT ON ADMISSION AND PATIENT CARE: 60 minutes.  ____________________________ Albertine Patricia, MD dse:sb D: 12/17/2012 04:47:02 ET T: 12/17/2012 07:24:07 ET JOB#: [id number]  cc: Albertine Patricia, MD, <Dictator> Dickey Caamano Graciela Husbands MD ELECTRONICALLY SIGNED 12/20/2012 7:36

## 2015-03-26 NOTE — Consult Note (Signed)
DATE OF BIRTH:  Jul 01, 1923  DATE OF CONSULTATION:  12/23/2012  REFERRING PHYSICIAN:  Dr. Ola Spurr. CONSULTING PHYSICIAN:  Isaias Cowman, MD  CHIEF COMPLAINT:  Right arm pain.   REASON FOR CONSULTATION:  Consultation requested for evaluation of pericardial effusion.   HISTORY OF PRESENT ILLNESS:  The patient is an 81 year old female with history of dual-chamber pacemaker for sick sinus syndrome, atrial fibrillation, prior history of TIA. The patient presented to the [facility] Emergency Room on 12/17/2012 with a chief complaint of right arm/right chest pain. Upon admission, the patient was noted to have right middle lung infiltrate on chest x-ray with an elevated white count consistent with pneumonia. The patient has been treated for pneumonia during this hospitalization with intravenous antibiotics. CT scan was performed, which revealed mild bilateral basilar opacities and a small pericardial effusion. Echocardiogram performed 12/22/2012 revealed low normal left ventricular function with LV ejection fraction of 50% with moderate pericardial effusion.   PAST MEDICAL HISTORY: 1.  Sick sinus syndrome status post pacemaker.  2.  Hypertension.  3.  Hyperlipidemia.  4.  Atrial fibrillation.  5.  History of TIA, 08/12/2012.  6.  History of anxiety.  7.  History of meningioma.   MEDICATIONS ON ADMISSION:  Aspirin 81 mg daily, Pradaxa 150 mg b.i.d., losartan 50 mg daily, Cardizem CD 240 mg daily, metoprolol 25 mg b.i.d., fish oil one daily, glucosamine, Synthroid 50 mcg daily, multivitamin one daily, fluticasone spray 2 sprays both nostrils daily, magnesium 250 mg 2 tablets daily, PreserVision 2 drops both eyes daily, tramadol 50 mg 1/2 tablet q.i.d. p.r.n., calcium 600 mg b.i.d., Detrol 1 mg b.i.d., Tussionex p.r.n.   SOCIAL HISTORY:  The patient is married, lives with her husband. She denies tobacco abuse.   FAMILY HISTORY:  No immediate family history for coronary artery disease or myocardial  infarction.   REVIEW OF SYSTEMS:   CONSTITUTIONAL:  No fever or chills.  EYES:  No blurry vision.  EARS:  No hearing loss.  RESPIRATORY:  Shortness of breath with exertion, occasionally at rest.  CARDIOVASCULAR:  The patient denies chest pain, palpitations or heart racing.  GASTROINTESTINAL:  The patient does have reflux symptoms. Denies nausea, vomiting, constipation, diarrhea.  GENITOURINARY:  No dysuria or hematuria.  ENDOCRINE:  No polyuria or polydipsia.  MUSCULOSKELETAL:  The patient has some diffuse arthralgias and myalgias.  NEUROLOGICAL:  The patient denies focal muscle weakness or numbness. She has had a prior TIA.  PSYCHOLOGICAL:  The patient does have a history of anxiety. Denies depression.   PHYSICAL EXAMINATION:  VITAL SIGNS:  Blood pressure 107/62, pulse 87, respirations 19, temperature 98.1, pulse ox 93%.  HEENT:  Pupils equal, reactive to light and accommodation.  NECK:  Supple without thyromegaly.  LUNGS:  Decreased breath sounds in both bases.  HEART:  Normal JVP. Normal PMI. Irregularly irregular rhythm. Normal S1, S2. No appreciable gallop, murmur or rub.  ABDOMEN:  Soft, nontender. Pulses were intact bilaterally.  MUSCULOSKELETAL:  Normal muscle tone.  NEUROLOGICAL:  The patient is alert and oriented x 3. Motor and sensory are both grossly intact.   IMPRESSION:  An 81 year old female status post pacemaker for sick sinus syndrome with history of hypertension, hyperlipidemia and atrial fibrillation, currently on Pradaxa. The patient has had a history of a small pericardial effusion of uncertain clinical significance. CT scan showed small pericardial effusion. Echocardiogram was interpreted as showing moderate pericardial effusion without evidence for cardiac tamponade. There appears to be no clinical significance for pericardial effusion at this time.  The patient did present with pneumonia and symptoms of shortness of breath, likely secondary to pneumonia. The patient  appears to be hemodynamically stable.   RECOMMENDATIONS:  1.  Agree with current therapy.  2.  Will review echocardiogram.  3.  Would defer further evaluation of pericardial effusion. Pericardial effusion not large enough for thoracentesis, and I do not feel that pericardial window is clinically indicated. The patient has had a stable small pericardial effusion for several years of uncertain clinical significance.      ____________________________ Isaias Cowman, MD ap:ms D: 12/23/2012 16:42:37 ET T: 12/23/2012 21:19:49 ET JOB#: [id number]  cc: Isaias Cowman, MD, <Dictator> Isaias Cowman MD ELECTRONICALLY SIGNED 01/28/2013 14:56

## 2015-03-26 NOTE — Discharge Summary (Signed)
PATIENT NAME:  [name], [name] MR#:  [id number] DATE OF BIRTH:  07-27-1923  DATE OF ADMISSION:  12/17/2012 DATE OF DISCHARGE:  12/25/2012  DISCHARGE DIAGNOSES:  1. Aspiration pneumonia.  2. Dysphagia.  3. Atrial fibrillation with rapid ventricular response.  4. Leukocytosis.   HISTORY OF PRESENT ILLNESS: Please see admission history and physical from January 14th. Briefly, this is a pleasant 81 year old woman with a-fib, status post dual-chamber pacemaker as well as hypertension, hyperlipidemia, and a recent TIA, who was admitted with acute onset of right-sided chest pain after eating and coughing spells. She had also had a report of some ongoing dysphagia for several weeks. She was admitted to evaluate for coronary artery disease.   HOSPITAL COURSE BY ISSUE:  1. Pneumonia:  This is likely aspiration. She had an elevated white count on admission up to 16.0. D-dimer was negative. She was treated with azithromycin and ceftriaxone, which was then changed to Unasyn to cover for aspiration.  2. Dysphagia: We consulted Dr. Dionne Milo as well as [facility] and Sw[facility]. Dr. Dionne Milo recommended that she will likely need an EGD but hoped to do it as an outpatient. She had a modified barium swallow by Speech. They recommended dietary changes, and she had improvement in her dysphagia. She will need to follow up with Dr. Dionne Milo for her EGD as an outpatient.  3. Atrial fibrillation with rapid ventricular response: The patient required increasing her Cardizem from 240 to 300. She was seen by Dr. Saralyn Pilar because a repeat echocardiogram showed a pericardial effusion. This was felt to be chronic, and no other recommendations were made.   DISCHARGE MEDICATIONS: 1. Tylenol.  2. Fluticasone nasal spray.  3. Synthroid 50 mcg once a day.  4. Multivitamin.  5. PreserVision oral tablet.  6. Metoprolol 25 mg twice a day.  7. Tramadol 50 once a day.  8. Losartan 50 mg once a day.  9. Pradaxa 150 twice a day.   10. Aspirin 325 once a day.  11. Calcium with vitamin D.  12. Glucosamine 500 once a day.  13. Magnesium twice a day.  14. Biotin 5 mg once a day.  15. Red rice yeast 600, 2 capsules once a day.  16. Tolterodine 1 mg twice a day.  17. Fish oil once a day.  18. Diltiazem 300 mg extended-release 1 capsule once a day.  19. Omeprazole 40 mg 1 tablet twice a day.   DISCHARGE DIET: Mechanical soft with thin liquids as directed by [facility] Sw[facility]. Diet special instructions were given. See discharge form for details.    FOLLOWUP: With Dr. Ola Spurr in 1 to 2 weeks, as well as with Dr. Dionne Milo in 1 to 2 weeks.   TIME SPENT:   This discharge took 40 minutes.   ____________________________ Cheral Marker. Ola Spurr, MD dpf:cb D: 01/01/2013 13:31:52 ET T: 01/01/2013 13:56:55 ET JOB#: [id number]  cc: Cheral Marker. Ola Spurr, MD, <Dictator> DAVID Ola Spurr MD ELECTRONICALLY SIGNED 01/01/2013 15:40

## 2015-03-26 NOTE — Consult Note (Signed)
Brief Consult Note: Diagnosis: Dysphagia.   Patient was seen by consultant.   Consult note dictated.   Orders entered.   Comments: Dysphagia for few months. Probable pneumonia. Patient is on Pradaxa.  Recommendations: Barium swallow today. EGD was considered but would have to wait due to her pneumonia and being on Pradaxa. Further recommendations to follow after barium swallow.  Electronic Signatures: Jill Side (MD)  (Signed 15-Jan-14 12:00)  Authored: Brief Consult Note   Last Updated: 15-Jan-14 12:00 by Jill Side (MD)

## 2015-03-26 NOTE — Consult Note (Signed)
PATIENT NAME:  [name], [name] MR#:  [id number] DATE OF BIRTH:  [birth year]  DATE OF CONSULTATION:  12/18/2012  REFERRING PHYSICIAN:   CONSULTING PHYSICIAN:  Jill Side, MD  REASON FOR CONSULTATION: Dysphagia.   HISTORY OF PRESENT ILLNESS: This is an 81 year old female with multiple medical problems including history of arrhythmia status post pacemaker placement, hypertension, hyperlipidemia, atrial fibrillation, TIA and CVA, on Pradaxa. The patient was admitted with right-sided chest and right arm pain. It appears that she may have right middle lobe infiltrate. White cell count was elevated at 16.8. The patient is also giving history of difficulty swallowing and GI was consulted for possible EGD. According to the patient, for the last several months, she has been having some trouble swallowing, both solids and liquids. Food and liquids seem to stop in the mid retrosternal area with frequent regurgitation. She denies any cough or choking while eating. She has never had an upper GI endoscopy. Apparently a barium swallow or similar study was done, but according to her that was many, many years ago. No vomiting, no abdominal pain and no other significant GI symptoms.   PAST MEDICAL HISTORY:  1. History of sick sinus syndrome status post pacemaker placement.  2. Hyperlipidemia.  3. Hypertension.  4. History of transient ischemic attacks and cerebrovascular accident.  5. History of meningiomas. 6. Osteoporosis.   PAST SURGICAL HISTORY: 1. Cataract surgery.  2. Appendectomy.  3. Cholecystectomy.  4. Tonsillectomy.   ALLERGIES: SULFA, FLAGYL AND MONISTAT.   HOME MEDICATIONS: Aspirin, Synthroid, fluticasone, tramadol, Detrol, losartan, Pradaxa, Cardizem and metoprolol.  SOCIAL HISTORY: Does not smoke or drink.   REVIEW OF SYSTEMS: Negative except for some shortness of breath and cough along with right-sided chest pain, as described above.   PHYSICAL EXAMINATION:  GENERAL: Elderly  female, does not appear to be in any acute distress.  VITALS: She has been afebrile. Heart rate is anywhere between 85 to 104, respiration about 18 and blood pressure 96/67.   HEENT: Otherwise unremarkable.   NECK: Neck veins are flat.   PULMONARY: Lungs are grossly clear to auscultation with few scattered crackles bilaterally.   CARDIOVASCULAR: Irregular rate and rhythm. No gallops or murmur.   ABDOMEN: Examination is quite benign. Abdomen is slightly distended, but otherwise soft. Bowel sounds are positive. Nontender. No rebound or guarding was noted.   NEUROLOGIC: Examination appears to be grossly nonfocal.   LABS/STUDIES: White cell count which was elevated on admission to 16,000 is 15.4 today, hemoglobin 11.9, hematocrit 34.3 and platelet count 258. Electrolytes are fairly unremarkable. Creatinine is 1.31.   CT of chest without contrast showed mild bilateral basilar opacities, probably atelectasis, mild biapical opacities, probably scarring, small pericardial infusion, questionable small infiltrate in the right middle lobe of the lung and a thyroid nodule.   ASSESSMENT AND PLAN: A patient with dysphagia to both solids and liquids. She has never had an upper GI endoscopy. An upper GI endoscopy is an option, although it would have to be delayed because of her being on Pradaxa and due to recent pneumonia with some shortness of breath. I decided to perform a barium swallow for further evaluation of her esophagus. Apparently barium swallow was initiated, but was terminated due to what appears to be penetration of trachea from the barium. I would recommend speech evaluation. Most likely her dysphagia is related to prior stroke and is neuromuscular in origin. Further recommendations to follow after speech evaluation. If the patient requires an upper GI endoscopy, it will done a  few days down the road once her pneumonia is better and she can safely be taken off Pradaxa for a couple of days. We will  follow.  ____________________________ Jill Side, MD si:sb D: 12/18/2012 07:61:51 ET T: 12/18/2012 12:35:33 ET JOB#: [id number]  cc: Jill Side, MD, <Dictator> Jill Side MD ELECTRONICALLY SIGNED 12/19/2012 17:32

## 2015-04-06 ENCOUNTER — Other Ambulatory Visit: Payer: Self-pay | Admitting: Specialist

## 2015-04-06 DIAGNOSIS — R911 Solitary pulmonary nodule: Secondary | ICD-10-CM

## 2015-04-13 ENCOUNTER — Ambulatory Visit
Admission: RE | Admit: 2015-04-13 | Discharge: 2015-04-13 | Disposition: A | Discharge status: Home / Self Care | Payer: Medicare Other | Source: Ambulatory Visit | Attending: Specialist | Admitting: Specialist

## 2015-04-13 DIAGNOSIS — R911 Solitary pulmonary nodule: Secondary | ICD-10-CM | POA: Diagnosis present

## 2015-04-13 DIAGNOSIS — K753 Granulomatous hepatitis, not elsewhere classified: Secondary | ICD-10-CM | POA: Insufficient documentation

## 2015-04-13 DIAGNOSIS — K449 Diaphragmatic hernia without obstruction or gangrene: Secondary | ICD-10-CM | POA: Insufficient documentation

## 2015-04-13 DIAGNOSIS — I2510 Atherosclerotic heart disease of native coronary artery without angina pectoris: Secondary | ICD-10-CM | POA: Diagnosis not present

## 2015-05-04 DIAGNOSIS — E039 Hypothyroidism, unspecified: Secondary | ICD-10-CM | POA: Insufficient documentation

## 2015-05-04 DIAGNOSIS — Z8709 Personal history of other diseases of the respiratory system: Secondary | ICD-10-CM | POA: Insufficient documentation

## 2015-09-24 ENCOUNTER — Other Ambulatory Visit: Payer: Self-pay | Admitting: Sports Medicine

## 2015-09-24 ENCOUNTER — Ambulatory Visit (INDEPENDENT_AMBULATORY_CARE_PROVIDER_SITE_OTHER): Payer: Medicare Other | Admitting: Sports Medicine

## 2015-09-24 ENCOUNTER — Encounter: Payer: Self-pay | Admitting: Sports Medicine

## 2015-09-24 ENCOUNTER — Ambulatory Visit (INDEPENDENT_AMBULATORY_CARE_PROVIDER_SITE_OTHER): Payer: Medicare Other

## 2015-09-24 DIAGNOSIS — L98491 Non-pressure chronic ulcer of skin of other sites limited to breakdown of skin: Secondary | ICD-10-CM | POA: Diagnosis not present

## 2015-09-24 DIAGNOSIS — L02619 Cutaneous abscess of unspecified foot: Secondary | ICD-10-CM | POA: Diagnosis not present

## 2015-09-24 DIAGNOSIS — M79605 Pain in left leg: Secondary | ICD-10-CM | POA: Diagnosis not present

## 2015-09-24 DIAGNOSIS — L03119 Cellulitis of unspecified part of limb: Secondary | ICD-10-CM | POA: Diagnosis not present

## 2015-09-24 DIAGNOSIS — M79672 Pain in left foot: Secondary | ICD-10-CM

## 2015-09-24 DIAGNOSIS — G629 Polyneuropathy, unspecified: Secondary | ICD-10-CM

## 2015-09-24 MED ORDER — CEPHALEXIN 500 MG PO CAPS: 500.0000 mg | ORAL_CAPSULE | Freq: Two times a day (BID) | ORAL | Status: DC

## 2015-09-24 MED ORDER — MUPIROCIN CALCIUM 2 % EX CREA: 1.0000 "application " | TOPICAL_CREAM | Freq: Two times a day (BID) | CUTANEOUS | Status: DC

## 2015-09-24 NOTE — Progress Notes (Signed)
Patient ID: [name], female   DOB: Jan 09, 1923, 81 y.o.   MRN: [id number] Subjective: [name] is a 81 y.o. female patient seen in office for evaluation of callus/ulceration of the Left foot. Patient has a history of neuropathy.   Patient states that she thought it was just a callus and noticed last that her foot got more swollen and warm a few days ago. States that she had the callus for about 1 month and thought that it was getting better . Denies nausea/fever/vomiting/chills/night sweats/shortness of breath/pain. Patient has no other pedal complaints at this time.  There are no active problems to display for this patient.  Current Outpatient Prescriptions on File Prior to Visit  Medication Sig Dispense Refill  . Acetaminophen (TYLENOL 8 HOUR PO) Take by mouth daily.    Marland Kitchen aspirin 81 MG tablet Take 81 mg by mouth daily.    Marland Kitchen BIOTIN PO Take by mouth daily.    . Calcium Carbonate-Vitamin D (CALCIUM + D PO) Take by mouth daily.    . Cyanocobalamin (VITAMIN B 12 PO) Take by mouth daily.    . dabigatran (PRADAXA) 150 MG CAPS capsule Take 150 mg by mouth 2 (two) times daily.    Marland Kitchen diltiazem (DILACOR XR) 240 MG 24 hr capsule Take 240 mg by mouth daily.    . fluticasone (VERAMYST) 27.5 MCG/SPRAY nasal spray Place 2 sprays into the nose daily.    . furosemide (LASIX) 40 MG tablet Take 40 mg by mouth daily.    . Glucosamine HCl (GLUCOSAMINE PO) Take by mouth daily.    . Levothyroxine Sodium (SYNTHROID PO) Take by mouth daily.    Marland Kitchen losartan (COZAAR) 50 MG tablet Take 50 mg by mouth daily.    Marland Kitchen MAGNESIUM PO Take by mouth 2 (two) times daily.    . metoprolol succinate (TOPROL-XL) 25 MG 24 hr tablet Take 25 mg by mouth daily.    . Multiple Vitamins-Minerals (VISION-VITE PRESERVE PO) Take by mouth daily.    Marland Kitchen omeprazole (PRILOSEC) 40 MG capsule Take 40 mg by mouth daily.    . Potassium Chloride (KLOR-CON PO) Take by mouth daily.    . Red Yeast Rice Extract (RED YEAST RICE PO) Take by mouth  daily.    Marland Kitchen tolterodine (DETROL) 1 MG tablet Take 1 mg by mouth 2 (two) times daily.    . TRAMADOL HCL PO Take by mouth daily.    Marland Kitchen VITAMIN A PO Take by mouth daily.     No current facility-administered medications on file prior to visit.   Allergies  Allergen Reactions  . Metronidazole Nausea And Vomiting  . Monistat  [Tioconazole] Rash  . Sulfa Antibiotics Rash and Nausea And Vomiting    Objective: Vitals: Reviewed  General: Patient is awake, alert, oriented x 3 and in no acute distress.  Dermatology: Skin is warm and dry bilateral with a fluctuant blister noted sub 1 on left foot once debrided a partial  thickness ulceration was present. Ulceration measures 0.8 cm x 0.5 cm with keratotic border with a granular base. The ulceration does not probe to bone. There is no malodor.  There is active serosanginous drainage present with mild local erythema and edema. No other signs of infection.   Vascular: Dorsalis Pedis pulse = 1/4 Bilateral,  Posterior Tibial pulse = 0/4 Bilateral,  Capillary Fill Time < 5 seconds. + Varicosisties and pitting edema +1 to both legs.   Neurologic: Epicritic sensation absent to the level of ankle using  the 5.07/10g [employer].  Musculosketal: No Pain with palpation to ulcerated area. No pain with compression to calves bilateral. Asymptomatic hammertoes deformities noted bilateral.  Xrays, 3 views, Left: Osseous mineralization within normal limits, No fracture, no dislocation, no boney destruction, asymptomatic hammertoes, soft tissue swell and ulceration defect. No soft tissue gas. No foreign body.   Assessment and Plan:  Problem List Items Addressed This Visit    None    Visit Diagnoses    Cellulitis and abscess of foot, except toes    -  Primary    Left sub 1 at site of previous callus    Relevant Medications    cephALEXin (KEFLEX) 500 MG capsule    Other Relevant Orders    Wound culture    Neuropathic ulcer, limited to breakdown  of skin (HCC)        Neuropathy ([place])        Pain of left lower extremity        Left foot pain        Relevant Orders    DG Foot Complete Left      -Examined patient and discussed the progression of the wound and treatment alternatives - Excisionally dedbrided ulceration to healthy bleeding borders using a tissue nipper. Wound culture obtained. Will call patient if results are abnormal or if change in antibiotic therapy is warranted.  -Applied Iodoform packing and dry sterile dressing and instructed patient to pull packing tomorrow and continue with daily dressings at home consisting of bactroban and dry sterile dressing. -Rx Bactroban cream and Keflex 500mg  bid daily  -Recommend protection, elevation, limited activity to necessity. -Recommend use of post op shoe - Advised patient to go to the ER or return to office if the wound worsens or if constitutional symptoms are present. -Patient to return to office in 1 week for follow up care and evaluation or sooner if problems arise.   Landis Martins, DPM

## 2015-09-28 LAB — WOUND CULTURE

## 2015-10-01 ENCOUNTER — Ambulatory Visit (INDEPENDENT_AMBULATORY_CARE_PROVIDER_SITE_OTHER): Payer: Medicare Other | Admitting: Sports Medicine

## 2015-10-01 ENCOUNTER — Encounter: Payer: Self-pay | Admitting: Sports Medicine

## 2015-10-01 ENCOUNTER — Ambulatory Visit: Payer: Medicare Other | Admitting: Sports Medicine

## 2015-10-01 DIAGNOSIS — I739 Peripheral vascular disease, unspecified: Secondary | ICD-10-CM

## 2015-10-01 DIAGNOSIS — L98491 Non-pressure chronic ulcer of skin of other sites limited to breakdown of skin: Secondary | ICD-10-CM

## 2015-10-01 DIAGNOSIS — L02619 Cutaneous abscess of unspecified foot: Secondary | ICD-10-CM

## 2015-10-01 DIAGNOSIS — L89891 Pressure ulcer of other site, stage 1: Secondary | ICD-10-CM

## 2015-10-01 DIAGNOSIS — L03119 Cellulitis of unspecified part of limb: Secondary | ICD-10-CM

## 2015-10-01 DIAGNOSIS — M79672 Pain in left foot: Secondary | ICD-10-CM

## 2015-10-01 NOTE — Progress Notes (Signed)
Patient ID: [name], female   DOB: 1923/07/06, 81 y.o.   MRN: [id number] Subjective: [name] is a 81 y.o. female patient seen in office for follow up evaluation of left foot ulcer; states that her husband has been helping to change the dressing and has been taking oral Keflex with no adverse reaction. Denies nausea/fever/vomiting/chills/night sweats/shortness of breath/calf pain. Patient has no other pedal complaints at this time.  There are no active problems to display for this patient.  Current Outpatient Prescriptions on File Prior to Visit  Medication Sig Dispense Refill  . Acetaminophen (TYLENOL 8 HOUR PO) Take by mouth daily.    Marland Kitchen aspirin 81 MG tablet Take 81 mg by mouth daily.    Marland Kitchen BIOTIN PO Take by mouth daily.    . Calcium Carbonate-Vitamin D (CALCIUM + D PO) Take by mouth daily.    . cephALEXin (KEFLEX) 500 MG capsule Take 1 capsule (500 mg total) by mouth 2 (two) times daily. 28 capsule 0  . Cyanocobalamin (VITAMIN B 12 PO) Take by mouth daily.    . dabigatran (PRADAXA) 150 MG CAPS capsule Take 150 mg by mouth 2 (two) times daily.    Marland Kitchen diltiazem (DILACOR XR) 240 MG 24 hr capsule Take 240 mg by mouth daily.    . fluticasone (VERAMYST) 27.5 MCG/SPRAY nasal spray Place 2 sprays into the nose daily.    . furosemide (LASIX) 40 MG tablet Take 40 mg by mouth daily.    . Glucosamine HCl (GLUCOSAMINE PO) Take by mouth daily.    . Levothyroxine Sodium (SYNTHROID PO) Take by mouth daily.    Marland Kitchen losartan (COZAAR) 50 MG tablet Take 50 mg by mouth daily.    Marland Kitchen MAGNESIUM PO Take by mouth 2 (two) times daily.    . metoprolol succinate (TOPROL-XL) 25 MG 24 hr tablet Take 25 mg by mouth daily.    . Multiple Vitamins-Minerals (VISION-VITE PRESERVE PO) Take by mouth daily.    . mupirocin cream (BACTROBAN) 2 % Apply 1 application topically 2 (two) times daily. 15 g 0  . omeprazole (PRILOSEC) 40 MG capsule Take 40 mg by mouth daily.    . Potassium Chloride (KLOR-CON PO) Take by mouth  daily.    . Red Yeast Rice Extract (RED YEAST RICE PO) Take by mouth daily.    Marland Kitchen tolterodine (DETROL) 1 MG tablet Take 1 mg by mouth 2 (two) times daily.    . TRAMADOL HCL PO Take by mouth daily.    Marland Kitchen VITAMIN A PO Take by mouth daily.     No current facility-administered medications on file prior to visit.   Allergies  Allergen Reactions  . Metronidazole Nausea And Vomiting  . Monistat  [Tioconazole] Rash  . Sulfa Antibiotics Rash and Nausea And Vomiting    Objective: Vitals: Reviewed  General: Patient is awake, alert, oriented x 3 and in no acute distress.  Dermatology: Skin is warm and dry bilateral with a dry blister noted sub 1 on left foot with partial  thickness ulceration that measures 0.8 cm x 0.5 cm unchanged from prior with keratotic border with a granular base. The ulceration does not probe to bone. There is no malodor.  There is no drainage present. Mild local erythema and edema improved. No other signs of infection.   Vascular: Dorsalis Pedis pulse = 1/4 Bilateral,  Posterior Tibial pulse = 0/4 Bilateral,  Capillary Fill Time < 5 seconds. + Varicosisties and pitting edema +1 to both legs improving in nature.  Neurologic: Epicritic sensation absent to the level of ankle using the 5.07/10g [employer].  Musculosketal: No Pain with palpation to ulcerated area. No pain with compression to calves bilateral. Asymptomatic hammertoes deformities noted bilateral.  Assessment and Plan:  Problem List Items Addressed This Visit    None    Visit Diagnoses    Cellulitis and abscess of foot, except toes    -  Primary    Improved    Neuropathic ulcer, limited to breakdown of skin ([place])        left sub met 1     Left foot pain        PVD (peripheral vascular disease) ([place])           -Examined patient and discussed the progression of the wound and treatment alternatives - Excisionally dedbrided ulceration to healthy bleeding borders using a tissue nipper.   -Applied bactroban and dry sterile dressing and instructed patient to continue with daily dressings at home consisting of bactroban and dry sterile dressing. -Cont Keflex 553m bid daily  -Recommend protection, elevation, limited activity to necessity. -Cont use of post op shoe - Advised patient to go to the ER or return to office if the wound worsens or if constitutional symptoms are present. -Patient to return to office in 2 weeks for follow up care and evaluation or sooner if problems arise.   TLandis Martins DPM

## 2015-10-13 ENCOUNTER — Telehealth: Payer: Self-pay | Admitting: *Deleted

## 2015-10-13 NOTE — Telephone Encounter (Signed)
Pt states she usually has appt with Dr. Cannon Kettle on Friday, but will have to wait until next week and her foot looks healed, can she come out of the boot.  I told pt to decrease the chance of skin breakdown due to rubbing or the foot bending in a way that would cause injury to the skin keep the boot on until she sees Dr. Cannon Kettle.  Pt states she has to drive her husband to the doctor.  I told her to walk in the boot but to drive in the regular shoe.  Pt states understanding.

## 2015-10-19 ENCOUNTER — Encounter: Payer: Self-pay | Admitting: Sports Medicine

## 2015-10-19 ENCOUNTER — Ambulatory Visit: Payer: Medicare Other | Admitting: Sports Medicine

## 2015-10-19 ENCOUNTER — Ambulatory Visit (INDEPENDENT_AMBULATORY_CARE_PROVIDER_SITE_OTHER): Payer: Medicare Other | Admitting: Sports Medicine

## 2015-10-19 DIAGNOSIS — I739 Peripheral vascular disease, unspecified: Secondary | ICD-10-CM

## 2015-10-19 DIAGNOSIS — L02619 Cutaneous abscess of unspecified foot: Secondary | ICD-10-CM

## 2015-10-19 DIAGNOSIS — L98491 Non-pressure chronic ulcer of skin of other sites limited to breakdown of skin: Secondary | ICD-10-CM

## 2015-10-19 DIAGNOSIS — M79672 Pain in left foot: Secondary | ICD-10-CM

## 2015-10-19 DIAGNOSIS — M2040 Other hammer toe(s) (acquired), unspecified foot: Secondary | ICD-10-CM

## 2015-10-19 DIAGNOSIS — L89891 Pressure ulcer of other site, stage 1: Secondary | ICD-10-CM | POA: Diagnosis not present

## 2015-10-19 DIAGNOSIS — L03119 Cellulitis of unspecified part of limb: Secondary | ICD-10-CM

## 2015-10-19 NOTE — Progress Notes (Signed)
Patient ID: [name], female   DOB: 04-18-1923, 81 y.o.   MRN: [id number]  Subjective: [name] is a 81 y.o. female patient seen in office for follow up evaluation of left foot ulcer; states that her husband has been helping to change the dressing and has completed oral Keflex with no adverse reaction. Admits to a new spot in between the 4-5 toes on the left foot that started last week; reports that the dressing may have been too tight and caused rubbing. Denies nausea/fever/vomiting/chills/night sweats/shortness of breath/calf pain. Patient has no other pedal complaints at this time.  There are no active problems to display for this patient.  Current Outpatient Prescriptions on File Prior to Visit  Medication Sig Dispense Refill  . Acetaminophen (TYLENOL 8 HOUR PO) Take by mouth daily.    Marland Kitchen aspirin 81 MG tablet Take 81 mg by mouth daily.    Marland Kitchen BIOTIN PO Take by mouth daily.    . Calcium Carbonate-Vitamin D (CALCIUM + D PO) Take by mouth daily.    . cephALEXin (KEFLEX) 500 MG capsule Take 1 capsule (500 mg total) by mouth 2 (two) times daily. 28 capsule 0  . Cyanocobalamin (VITAMIN B 12 PO) Take by mouth daily.    . dabigatran (PRADAXA) 150 MG CAPS capsule Take 150 mg by mouth 2 (two) times daily.    Marland Kitchen diltiazem (DILACOR XR) 240 MG 24 hr capsule Take 240 mg by mouth daily.    . fluticasone (VERAMYST) 27.5 MCG/SPRAY nasal spray Place 2 sprays into the nose daily.    . furosemide (LASIX) 40 MG tablet Take 40 mg by mouth daily.    . Glucosamine HCl (GLUCOSAMINE PO) Take by mouth daily.    . Levothyroxine Sodium (SYNTHROID PO) Take by mouth daily.    Marland Kitchen losartan (COZAAR) 50 MG tablet Take 50 mg by mouth daily.    Marland Kitchen MAGNESIUM PO Take by mouth 2 (two) times daily.    . metoprolol succinate (TOPROL-XL) 25 MG 24 hr tablet Take 25 mg by mouth daily.    . Multiple Vitamins-Minerals (VISION-VITE PRESERVE PO) Take by mouth daily.    . mupirocin cream (BACTROBAN) 2 % Apply 1 application  topically 2 (two) times daily. 15 g 0  . omeprazole (PRILOSEC) 40 MG capsule Take 40 mg by mouth daily.    . Potassium Chloride (KLOR-CON PO) Take by mouth daily.    . Red Yeast Rice Extract (RED YEAST RICE PO) Take by mouth daily.    Marland Kitchen tolterodine (DETROL) 1 MG tablet Take 1 mg by mouth 2 (two) times daily.    . TRAMADOL HCL PO Take by mouth daily.    Marland Kitchen VITAMIN A PO Take by mouth daily.     No current facility-administered medications on file prior to visit.   Allergies  Allergen Reactions  . Metronidazole Nausea And Vomiting  . Monistat  [Tioconazole] Rash  . Sulfa Antibiotics Rash and Nausea And Vomiting    Objective:  General: Patient is awake, alert, oriented x 3 and in no acute distress.  Dermatology: Skin is warm and dry bilateral with a dry blister noted sub 1 on left foot with partial  thickness ulceration that measures 0.4x0.3cm (lass measurement 0.8 cm x 0.5 cm) with keratotic border with a granular base. The ulceration does not probe to bone. There is no malodor.  There is no drainage or acute signs of infection present. There is a new ulceration in the 4th webspace that is likely secondary  to the toes rubbing and hammertoe deformities that measures 0.2x0.2cm with mild keratotic rim and granular base. Ulceration does not probe to bone. There is no malodor, no drainage, no acute signs of infection. Mild local erythema and edema to left foot improved. No other signs of infection.   Vascular: Dorsalis Pedis pulse = 1/4 Bilateral,  Posterior Tibial pulse = 0/4 Bilateral,  Capillary Fill Time < 5 seconds. + Varicosisties and trace edema to both legs improving in nature.   Neurologic: Epicritic sensation absent to the level of ankle using the 5.07/10g [employer].  Musculosketal: No Pain with palpation to ulcerated area. No pain with compression to calves bilateral. Asymptomatic hammertoes deformities noted bilateral.  Assessment and Plan:  Problem List Items  Addressed This Visit    None    Visit Diagnoses    Left foot pain    -  Primary    Improving    Neuropathic ulcer, limited to breakdown of skin ([place])        Sub met 1 left, improving 4th webspace, new secondary to hammertoes that are rubbing together    PVD (peripheral vascular disease) ([place])        Hammertoe, unspecified laterality        Cellulitis and abscess of foot, except toes        Resolved       -Examined patient and discussed the progression of the wound and treatment alternatives - Excisionally dedbrided ulcerations to healthy bleeding borders using a chisel blade -Applied silvadene and dry sterile dressing and instructed patient to continue with daily dressings at home consisting of bactroban and dry sterile dressing to both ulceration sites. -Recommend protection, elevation, limited activity to necessity. Cont use of post op shoe - Advised patient to go to the ER or return to office if the wound worsens or if constitutional symptoms are present. -Patient to return to office in 2 weeks for follow up care and evaluation or sooner if problems arise.   Landis Martins, DPM

## 2015-11-05 ENCOUNTER — Encounter: Payer: Self-pay | Admitting: Sports Medicine

## 2015-11-05 ENCOUNTER — Ambulatory Visit (INDEPENDENT_AMBULATORY_CARE_PROVIDER_SITE_OTHER): Payer: Medicare Other | Admitting: Sports Medicine

## 2015-11-05 DIAGNOSIS — L98491 Non-pressure chronic ulcer of skin of other sites limited to breakdown of skin: Secondary | ICD-10-CM

## 2015-11-05 DIAGNOSIS — M79672 Pain in left foot: Secondary | ICD-10-CM

## 2015-11-05 DIAGNOSIS — I739 Peripheral vascular disease, unspecified: Secondary | ICD-10-CM

## 2015-11-05 DIAGNOSIS — M2040 Other hammer toe(s) (acquired), unspecified foot: Secondary | ICD-10-CM

## 2015-11-06 NOTE — Progress Notes (Signed)
Patient ID: [name], female   DOB: 02/22/1923, 81 y.o.   MRN: [id number]  Subjective: [name] is a 81 y.o. female patient seen in office for follow up evaluation of left foot ulcer; states that she has been covering sites with bandaids and guaze and applying topical antibiotic cream. Denies nausea/fever/vomiting/chills/night sweats/shortness of breath/calf pain. Patient has no other pedal complaints at this time.  There are no active problems to display for this patient.  Current Outpatient Prescriptions on File Prior to Visit  Medication Sig Dispense Refill  . Acetaminophen (TYLENOL 8 HOUR PO) Take by mouth daily.    Marland Kitchen aspirin 81 MG tablet Take 81 mg by mouth daily.    Marland Kitchen BIOTIN PO Take by mouth daily.    . Calcium Carbonate-Vitamin D (CALCIUM + D PO) Take by mouth daily.    . cephALEXin (KEFLEX) 500 MG capsule Take 1 capsule (500 mg total) by mouth 2 (two) times daily. 28 capsule 0  . Cyanocobalamin (VITAMIN B 12 PO) Take by mouth daily.    . dabigatran (PRADAXA) 150 MG CAPS capsule Take 150 mg by mouth 2 (two) times daily.    Marland Kitchen diltiazem (DILACOR XR) 240 MG 24 hr capsule Take 240 mg by mouth daily.    . fluticasone (VERAMYST) 27.5 MCG/SPRAY nasal spray Place 2 sprays into the nose daily.    . furosemide (LASIX) 40 MG tablet Take 40 mg by mouth daily.    . Glucosamine HCl (GLUCOSAMINE PO) Take by mouth daily.    . Levothyroxine Sodium (SYNTHROID PO) Take by mouth daily.    Marland Kitchen losartan (COZAAR) 50 MG tablet Take 50 mg by mouth daily.    Marland Kitchen MAGNESIUM PO Take by mouth 2 (two) times daily.    . metoprolol succinate (TOPROL-XL) 25 MG 24 hr tablet Take 25 mg by mouth daily.    . Multiple Vitamins-Minerals (VISION-VITE PRESERVE PO) Take by mouth daily.    . mupirocin cream (BACTROBAN) 2 % Apply 1 application topically 2 (two) times daily. 15 g 0  . omeprazole (PRILOSEC) 40 MG capsule Take 40 mg by mouth daily.    . Potassium Chloride (KLOR-CON PO) Take by mouth daily.    . Red  Yeast Rice Extract (RED YEAST RICE PO) Take by mouth daily.    Marland Kitchen tolterodine (DETROL) 1 MG tablet Take 1 mg by mouth 2 (two) times daily.    . TRAMADOL HCL PO Take by mouth daily.    Marland Kitchen VITAMIN A PO Take by mouth daily.     No current facility-administered medications on file prior to visit.   Allergies  Allergen Reactions  . Metronidazole Nausea And Vomiting  . Monistat  [Tioconazole] Rash  . Sulfa Antibiotics Rash and Nausea And Vomiting    Objective:  General: Patient is awake, alert, oriented x 3 and in no acute distress.  Dermatology: Skin is warm and dry bilateral, sub 1 on left foot ulceration healed with no acute signs of infection. There is a ulceration in the 4th webspace that is likely secondary to the toes rubbing and hammertoe deformities that measures 0.1x0.1cm (last measurement 0.2x0.2cm) with mild keratotic rim and granular base. Ulceration does not probe to bone. There is no malodor, no drainage, no acute signs of infection. No local erythema. . No other signs of infection.   Vascular: Dorsalis Pedis pulse = 1/4 Bilateral,  Posterior Tibial pulse = 0/4 Bilateral,  Capillary Fill Time < 5 seconds. + Varicosisties and +1 pitting edema to  left>right  foot and lower leg.   Neurologic: Epicritic sensation absent to the level of ankle using the 5.07/10g [employer].  Musculosketal: No Pain with palpation to ulcerated area. No pain with compression to calves bilateral. Asymptomatic hammertoes deformities noted bilateral.  Assessment and Plan:  Problem List Items Addressed This Visit    None    Visit Diagnoses    Left foot pain    -  Primary    Neuropathic ulcer, limited to breakdown of skin (HCC)        PVD (peripheral vascular disease) ([place])        Hammertoe, unspecified laterality           -Examined patient and discussed the progression of the wound and treatment alternatives -Sub met 1 ulcer is healed; dressing/wound care no longer warranted -4th  toe ulcer still present; applied topical antibiotic patient to continue with this daily until resolves -Dispensed compression sock for left to assist with edema control -Advised patient to slowly transition over the next few weeks back to normal shoes - Advised patient to still limit activity to necessity until complete healing is achieved. -Advised patient to go to the ER or return to office if the wound worsens or if constitutional symptoms are present. -Patient to return to office in 2-3 weeks for follow up care and evaluation or sooner if problems arise.   Landis Martins, DPM

## 2015-11-08 ENCOUNTER — Other Ambulatory Visit: Payer: Self-pay | Admitting: Specialist

## 2015-11-08 DIAGNOSIS — R600 Localized edema: Secondary | ICD-10-CM

## 2015-11-10 ENCOUNTER — Ambulatory Visit
Admission: RE | Admit: 2015-11-10 | Discharge: 2015-11-10 | Disposition: A | Discharge status: Home / Self Care | Payer: Medicare Other | Source: Ambulatory Visit | Attending: Specialist | Admitting: Specialist

## 2015-11-10 ENCOUNTER — Telehealth: Payer: Self-pay | Admitting: *Deleted

## 2015-11-10 DIAGNOSIS — R600 Localized edema: Secondary | ICD-10-CM | POA: Insufficient documentation

## 2015-11-10 NOTE — Telephone Encounter (Addendum)
Pt left name and home phone.  Left message on both home and mobile phone to call again with a brief message and I would call with an answer.  Pt states she wanted Dr. Cannon Kettle to know Dr. Raul Del had ordered ultrasound, no results back and Dr. Ola Spurr ordered an antibiotic.  I told pt I would inform Dr.Stover, and to please have the ultrasound results sent to our office as well.

## 2015-11-19 ENCOUNTER — Encounter: Payer: Self-pay | Admitting: Sports Medicine

## 2015-11-19 ENCOUNTER — Ambulatory Visit: Payer: Self-pay

## 2015-11-19 ENCOUNTER — Ambulatory Visit (INDEPENDENT_AMBULATORY_CARE_PROVIDER_SITE_OTHER): Payer: Medicare Other | Admitting: Sports Medicine

## 2015-11-19 DIAGNOSIS — G619 Inflammatory polyneuropathy, unspecified: Secondary | ICD-10-CM | POA: Insufficient documentation

## 2015-11-19 DIAGNOSIS — L98491 Non-pressure chronic ulcer of skin of other sites limited to breakdown of skin: Secondary | ICD-10-CM

## 2015-11-19 DIAGNOSIS — L02619 Cutaneous abscess of unspecified foot: Secondary | ICD-10-CM

## 2015-11-19 DIAGNOSIS — I739 Peripheral vascular disease, unspecified: Secondary | ICD-10-CM

## 2015-11-19 DIAGNOSIS — L89891 Pressure ulcer of other site, stage 1: Secondary | ICD-10-CM

## 2015-11-19 DIAGNOSIS — L03119 Cellulitis of unspecified part of limb: Secondary | ICD-10-CM

## 2015-11-19 MED ORDER — MUPIROCIN CALCIUM 2 % EX CREA: 1.0000 "application " | TOPICAL_CREAM | Freq: Two times a day (BID) | CUTANEOUS | Status: DC

## 2015-11-19 MED ORDER — CEPHALEXIN 500 MG PO CAPS: 500.0000 mg | ORAL_CAPSULE | Freq: Two times a day (BID) | ORAL | Status: DC

## 2015-11-19 NOTE — Progress Notes (Signed)
Patient ID: [name], female   DOB: 1923/03/27, 81 y.o.   MRN: [id number]   Subjective: NAJAH LIVERMAN is a 81 y.o. female patient seen in office for follow up evaluation of left foot ulcers; states that she saw some skin on the bottom of her left foot and pulled it and re-opened the ulcer; patient went to Dr. Ola Spurr who cultured and re-started the antibiotics. Patient also had a duplex done on 11-10-15 which was negative for DVT. Patient states that she has been keeping antibiotic cream on the ulcer site. Patient denies nausea/fever/vomiting/chills/night sweats/shortness of breath/calf pain. Patient has no other pedal complaints at this time.  There are no active problems to display for this patient.  Current Outpatient Prescriptions on File Prior to Visit  Medication Sig Dispense Refill  . Acetaminophen (TYLENOL 8 HOUR PO) Take by mouth daily.    Marland Kitchen aspirin 81 MG tablet Take 81 mg by mouth daily.    Marland Kitchen BIOTIN PO Take by mouth daily.    . Calcium Carbonate-Vitamin D (CALCIUM + D PO) Take by mouth daily.    . Cyanocobalamin (VITAMIN B 12 PO) Take by mouth daily.    . dabigatran (PRADAXA) 150 MG CAPS capsule Take 150 mg by mouth 2 (two) times daily.    Marland Kitchen diltiazem (DILACOR XR) 240 MG 24 hr capsule Take 240 mg by mouth daily.    . fluticasone (VERAMYST) 27.5 MCG/SPRAY nasal spray Place 2 sprays into the nose daily.    . furosemide (LASIX) 40 MG tablet Take 40 mg by mouth daily.    . Glucosamine HCl (GLUCOSAMINE PO) Take by mouth daily.    . Levothyroxine Sodium (SYNTHROID PO) Take by mouth daily.    Marland Kitchen losartan (COZAAR) 50 MG tablet Take 50 mg by mouth daily.    Marland Kitchen MAGNESIUM PO Take by mouth 2 (two) times daily.    . metoprolol succinate (TOPROL-XL) 25 MG 24 hr tablet Take 25 mg by mouth daily.    . Multiple Vitamins-Minerals (VISION-VITE PRESERVE PO) Take by mouth daily.    Marland Kitchen omeprazole (PRILOSEC) 40 MG capsule Take 40 mg by mouth daily.    . Potassium Chloride (KLOR-CON PO) Take by  mouth daily.    . Red Yeast Rice Extract (RED YEAST RICE PO) Take by mouth daily.    Marland Kitchen tolterodine (DETROL) 1 MG tablet Take 1 mg by mouth 2 (two) times daily.    . TRAMADOL HCL PO Take by mouth daily.    Marland Kitchen VITAMIN A PO Take by mouth daily.     No current facility-administered medications on file prior to visit.   Allergies  Allergen Reactions  . Metronidazole Nausea And Vomiting and Other (See Comments)  . Monistat  [Tioconazole] Rash and Other (See Comments)  . Sulfa Antibiotics Rash and Nausea And Vomiting    Objective:  General: Patient is awake, alert, oriented x 3 and in no acute distress.  Dermatology: Skin is warm and dry bilateral, sub 1 on left foot ulceration now reopened was previously healed measuring 1x2cm does not probe to bone, partial thickness in nature with granular base, no active drainage, no malodor, no warmth, no acute signs of infection. There is a ulceration in the 4th webspace that is likely secondary to the toes rubbing and hammertoe deformities that measures 0.1x0.1cm (last measurement 0.2x0.2cm) unchanged from prior with mild keratotic rim and granular base. Ulceration does not probe to bone. There is no malodor, no drainage, no acute signs of infection. No  local erythema. No other signs of infection.   Vascular: Dorsalis Pedis pulse = 1/4 Bilateral,  Posterior Tibial pulse = 0/4 Bilateral,  Capillary Fill Time < 5 seconds. + Varicosisties and decreased edema to legs now only small amount of focal edema to the foot.   Neurologic: Epicritic sensation absent to the level of ankle using the 5.07/10g [employer].  Musculosketal: No Pain with palpation to ulcerated areas. No pain with compression to calves bilateral. Asymptomatic hammertoes deformities noted bilateral.  Left foot xrays: At areas of concern/ulceration no boney destruction, fracture, or dislocation. No evidence of osteomyelitis. No soft tissue gas. Mild soft tissue swelling  consistent with edema.  No other areas of acute concern at this time.   Assessment and Plan:  Problem List Items Addressed This Visit    None    Visit Diagnoses    Neuropathic ulcer, limited to breakdown of skin ([place])    -  Primary    Relevant Medications    mupirocin cream (BACTROBAN) 2 %    Other Relevant Orders    DG Foot 2 Views Left    PVD (peripheral vascular disease) ([place])        Relevant Orders    DG Foot 2 Views Left    Cellulitis and abscess of foot, except toes        Left sub 1 at site of previous callus    Relevant Medications    cephALEXin (KEFLEX) 500 MG capsule       -Examined patient and discussed the progression of the wound and treatment alternatives -Xrays reviewed -Strongly advised patient to refrain from peeling skin off the wounds  -Sub met 1 ulcer on left debrided with a sterile chisel blade and then dressed with silvadene and coban dressing; Patient to change dressings daily at home using bactroban which was reordered for the patient -4th toe ulcer on left still present; applied silvadene cream and offloading toe space; patient to continue with this daily until resolves -Dispensed surgigrip sock for left to assist with edema control in the foot -Advised patient to return to using post op shoe of which she has at home - Advised patient to still limit activity to necessity until complete healing is achieved. -Continue with keflex; refill sent to pharmacy  -Advised patient to go to the ER or return to office if the wound worsens or if constitutional symptoms are present. -Patient to return to office in 1-2 weeks for follow up care and evaluation or sooner if problems arise. If fails to progress will consider other treatment options.   Landis Martins, DPM

## 2015-12-03 ENCOUNTER — Encounter: Payer: Self-pay | Admitting: Sports Medicine

## 2015-12-03 ENCOUNTER — Ambulatory Visit (INDEPENDENT_AMBULATORY_CARE_PROVIDER_SITE_OTHER): Payer: Medicare Other | Admitting: Sports Medicine

## 2015-12-03 DIAGNOSIS — L02619 Cutaneous abscess of unspecified foot: Secondary | ICD-10-CM

## 2015-12-03 DIAGNOSIS — M79671 Pain in right foot: Secondary | ICD-10-CM | POA: Diagnosis not present

## 2015-12-03 DIAGNOSIS — I739 Peripheral vascular disease, unspecified: Secondary | ICD-10-CM

## 2015-12-03 DIAGNOSIS — L98491 Non-pressure chronic ulcer of skin of other sites limited to breakdown of skin: Secondary | ICD-10-CM

## 2015-12-03 DIAGNOSIS — M79672 Pain in left foot: Secondary | ICD-10-CM | POA: Diagnosis not present

## 2015-12-03 DIAGNOSIS — B351 Tinea unguium: Secondary | ICD-10-CM

## 2015-12-03 DIAGNOSIS — L03119 Cellulitis of unspecified part of limb: Secondary | ICD-10-CM | POA: Diagnosis not present

## 2015-12-03 NOTE — Progress Notes (Signed)
Patient ID: [name], female   DOB: 10/01/1923, 81 y.o.   MRN: [id number]   Subjective: [name] is a 81 y.o. female patient seen in office for follow up evaluation of left foot ulcers; states that she has been keeping antibiotic cream on the ulcer site however is still concerned with the amount of swelling that is still present in the left foot and leg. Reports that she has 2 more days of antibiotics left. Patient denies nausea/fever/vomiting/chills/night sweats/shortness of breath/calf pain. Patient has no other pedal complaints at this time.  There are no active problems to display for this patient.  Current Outpatient Prescriptions on File Prior to Visit  Medication Sig Dispense Refill  . Acetaminophen (TYLENOL 8 HOUR PO) Take by mouth daily.    Marland Kitchen aspirin 81 MG tablet Take 81 mg by mouth daily.    Marland Kitchen BIOTIN PO Take by mouth daily.    . Calcium Carbonate-Vitamin D (CALCIUM + D PO) Take by mouth daily.    . cephALEXin (KEFLEX) 500 MG capsule Take 1 capsule (500 mg total) by mouth 2 (two) times daily. 28 capsule 0  . Cyanocobalamin (VITAMIN B 12 PO) Take by mouth daily.    . dabigatran (PRADAXA) 150 MG CAPS capsule Take 150 mg by mouth 2 (two) times daily.    Marland Kitchen diltiazem (DILACOR XR) 240 MG 24 hr capsule Take 240 mg by mouth daily.    . fluticasone (VERAMYST) 27.5 MCG/SPRAY nasal spray Place 2 sprays into the nose daily.    . furosemide (LASIX) 40 MG tablet Take 40 mg by mouth daily.    . Glucosamine HCl (GLUCOSAMINE PO) Take by mouth daily.    . Levothyroxine Sodium (SYNTHROID PO) Take by mouth daily.    Marland Kitchen losartan (COZAAR) 50 MG tablet Take 50 mg by mouth daily.    Marland Kitchen MAGNESIUM PO Take by mouth 2 (two) times daily.    . metoprolol succinate (TOPROL-XL) 25 MG 24 hr tablet Take 25 mg by mouth daily.    . Multiple Vitamins-Minerals (VISION-VITE PRESERVE PO) Take by mouth daily.    . mupirocin cream (BACTROBAN) 2 % Apply 1 application topically 2 (two) times daily. 15 g 0  .  omeprazole (PRILOSEC) 40 MG capsule Take 40 mg by mouth daily.    . Potassium Chloride (KLOR-CON PO) Take by mouth daily.    . Red Yeast Rice Extract (RED YEAST RICE PO) Take by mouth daily.    Marland Kitchen tolterodine (DETROL) 1 MG tablet Take 1 mg by mouth 2 (two) times daily.    . TRAMADOL HCL PO Take by mouth daily.    Marland Kitchen VITAMIN A PO Take by mouth daily.     No current facility-administered medications on file prior to visit.   Allergies  Allergen Reactions  . Metronidazole Nausea And Vomiting and Other (See Comments)  . Monistat  [Tioconazole] Rash and Other (See Comments)  . Sulfa Antibiotics Rash and Nausea And Vomiting    Objective:  General: Patient is awake, alert, oriented x 3 and in no acute distress.  Dermatology: Skin is warm and dry bilateral, sub 1 on left foot ulceration is now prematurely closed (last measurement 1x2cm), no acute signs of infection. There is a ulceration at lateral aspect of 4th toe in the 4th webspace that is likely secondary to the toes rubbing and hammertoe deformities that measures 0.1x0.1cm (last measurement 0.2x0.2cm) unchanged from prior with mild keratotic rim and granular base. Ulceration does not probe to bone. There  is no malodor, no drainage, no acute signs of infection. No local erythema. No other signs of infection. Nails x 10 are thickened and dystrophic with subungal debris and mild distal lifting at bilateral hallux nails with no signs of infection.    Vascular: Dorsalis Pedis pulse = 1/4 Bilateral,  Posterior Tibial pulse = 0/4 Bilateral,  Capillary Fill Time < 5 seconds. + Varicosisties and 1+ pitting edema L>R leg and foot.   Neurologic: Epicritic sensation absent to the level of ankle using the 5.07/10g [employer].  Musculosketal: No Pain with palpation to ulcerated areas. No pain with compression to calves bilateral. Asymptomatic hammertoes deformities noted bilateral.  Assessment and Plan:  Problem List Items Addressed  This Visit    None    Visit Diagnoses    Neuropathic ulcer, limited to breakdown of skin ([place])    -  Primary    PVD (peripheral vascular disease) ([place])        Cellulitis and abscess of foot, except toes        Dermatophytosis of nail        Foot pain, bilateral           -Examined patient and discussed the progression of the wound and treatment alternatives -Left Sub met 1 ulcer is prematurely closed and 4th toe ulcer remains open; applied gentian violet and compressive unna boot to left lower extremity  -Advised patient to keep dressing clean, dry, and inact -Cont with post op shoe - Advised patient to still limit activity to necessity until complete healing is achieved. -Continue with keflex until complete -Advised patient to go to the ER or return to office if the wound worsens or if constitutional symptoms are present. -Patient to return to office in 1 weeks for follow up care and evaluation or sooner if problems arise. If fails to progress will consider other treatment options/vascular consultation.   Landis Martins, DPM

## 2015-12-10 ENCOUNTER — Encounter: Payer: Self-pay | Admitting: Sports Medicine

## 2015-12-10 ENCOUNTER — Ambulatory Visit: Payer: Medicare Other | Admitting: Sports Medicine

## 2015-12-10 ENCOUNTER — Ambulatory Visit (INDEPENDENT_AMBULATORY_CARE_PROVIDER_SITE_OTHER): Payer: Medicare Other | Admitting: Sports Medicine

## 2015-12-10 DIAGNOSIS — L03119 Cellulitis of unspecified part of limb: Secondary | ICD-10-CM | POA: Diagnosis not present

## 2015-12-10 DIAGNOSIS — I739 Peripheral vascular disease, unspecified: Secondary | ICD-10-CM

## 2015-12-10 DIAGNOSIS — M79673 Pain in unspecified foot: Secondary | ICD-10-CM | POA: Diagnosis not present

## 2015-12-10 DIAGNOSIS — L02619 Cutaneous abscess of unspecified foot: Secondary | ICD-10-CM

## 2015-12-10 DIAGNOSIS — T148 Other injury of unspecified body region: Secondary | ICD-10-CM

## 2015-12-10 DIAGNOSIS — L98491 Non-pressure chronic ulcer of skin of other sites limited to breakdown of skin: Secondary | ICD-10-CM | POA: Diagnosis not present

## 2015-12-10 DIAGNOSIS — T148XXA Other injury of unspecified body region, initial encounter: Secondary | ICD-10-CM

## 2015-12-10 NOTE — Progress Notes (Signed)
Patient ID: [name], female   DOB: 20-Jul-1923, 82 y.o.   MRN: [id number]  Subjective: [name] is a 82 y.o. female patient seen in office for follow up evaluation of left foot ulcers; states that she has picked at the lose skin near the big toe and caused some bleeding. Keep Unna boot on, clean, and dry with no problems. Finished antibiotic with no issues. Patient denies nausea/fever/vomiting/chills/night sweats/shortness of breath/calf pain. Patient has no other pedal complaints at this time.  There are no active problems to display for this patient.  Current Outpatient Prescriptions on File Prior to Visit  Medication Sig Dispense Refill  . Acetaminophen (TYLENOL 8 HOUR PO) Take by mouth daily.    Marland Kitchen aspirin 81 MG tablet Take 81 mg by mouth daily.    Marland Kitchen BIOTIN PO Take by mouth daily.    . Calcium Carbonate-Vitamin D (CALCIUM + D PO) Take by mouth daily.    . cephALEXin (KEFLEX) 500 MG capsule Take 1 capsule (500 mg total) by mouth 2 (two) times daily. 28 capsule 0  . Cyanocobalamin (VITAMIN B 12 PO) Take by mouth daily.    . dabigatran (PRADAXA) 150 MG CAPS capsule Take 150 mg by mouth 2 (two) times daily.    Marland Kitchen diltiazem (DILACOR XR) 240 MG 24 hr capsule Take 240 mg by mouth daily.    . fluticasone (VERAMYST) 27.5 MCG/SPRAY nasal spray Place 2 sprays into the nose daily.    . furosemide (LASIX) 40 MG tablet Take 40 mg by mouth daily.    . Glucosamine HCl (GLUCOSAMINE PO) Take by mouth daily.    . Levothyroxine Sodium (SYNTHROID PO) Take by mouth daily.    Marland Kitchen losartan (COZAAR) 50 MG tablet Take 50 mg by mouth daily.    Marland Kitchen MAGNESIUM PO Take by mouth 2 (two) times daily.    . metoprolol succinate (TOPROL-XL) 25 MG 24 hr tablet Take 25 mg by mouth daily.    . Multiple Vitamins-Minerals (VISION-VITE PRESERVE PO) Take by mouth daily.    . mupirocin cream (BACTROBAN) 2 % Apply 1 application topically 2 (two) times daily. 15 g 0  . omeprazole (PRILOSEC) 40 MG capsule Take 40 mg by  mouth daily.    . Potassium Chloride (KLOR-CON PO) Take by mouth daily.    . Red Yeast Rice Extract (RED YEAST RICE PO) Take by mouth daily.    Marland Kitchen tolterodine (DETROL) 1 MG tablet Take 1 mg by mouth 2 (two) times daily.    . TRAMADOL HCL PO Take by mouth daily.    Marland Kitchen VITAMIN A PO Take by mouth daily.     No current facility-administered medications on file prior to visit.   Allergies  Allergen Reactions  . Metronidazole Nausea And Vomiting and Other (See Comments)  . Monistat  [Tioconazole] Rash and Other (See Comments)  . Sulfa Antibiotics Rash and Nausea And Vomiting    Objective:  General: Patient is awake, alert, oriented x 3 and in no acute distress.  Dermatology: Skin is warm and dry bilateral, sub 1 on left foot ulceration is continues to be prematurely closed with friable skin from patient pick adjacent to previous ulcer site with no acute signs of infection. There is a ulceration at lateral aspect of 4th toe in the 4th webspace that is likely secondary to the toes rubbing and hammertoe deformities that measures 0.1x0.1cm (last measurement 0.1x0.1cm) unchanged from prior with mild keratotic rim and granular base. Ulceration does not probe to bone.  There is no malodor, no drainage, no acute signs of infection. No local erythema. No other signs of infection. During removal of unna boot, medical assistant created abrasions from use of scissor with bleeding at 2 areas on calf on left.    Vascular: Dorsalis Pedis pulse = 1/4 Bilateral,  Posterior Tibial pulse = 0/4 Bilateral,  Capillary Fill Time < 5 seconds. + Varicosisties and decreased edema L>R leg and foot.   Neurologic: Epicritic sensation absent to the level of ankle using the 5.07/10g [employer].  Musculosketal: No Pain with palpation to ulcerated areas however there is mild pain to abrasion sites on left. No pain with compression to calves bilateral. Asymptomatic hammertoes deformities noted  bilateral.  Assessment and Plan:  Problem List Items Addressed This Visit    None    Visit Diagnoses    Neuropathic ulcer, limited to breakdown of skin ([place])    -  Primary    Sub met 1 on left- prematurely closed however adjacent to site friable skin  Left lateral 4th toe- remains open    PVD (peripheral vascular disease) ([place])        Cellulitis and abscess of foot, except toes        Resolved    Foot pain, unspecified laterality        Abrasion        Created by medical assistant with removal of unna boot       -Examined patient and discussed the progression of the wound and treatment alternatives -Left Sub met 1 ulcer is prematurely closed and 4th toe ulcer remains open; applied silver nitrate to friable skin and to abrasion sites on calf covered with antibiotic cream. Applied to left 4th toe gentian violet and compressive unna boot to left lower extremity  -Advised patient to keep dressing clean, dry, and intact. Advised patient to refrain from picking at her foot.  -Cont with post op shoe - Advised patient to still limit activity to necessity until complete healing is achieved. -Advised patient to go to the ER or return to office if the wound worsens or if constitutional symptoms are present. -Patient to return to office in 1 week for follow up care and evaluation or sooner if problems arise. If fails to progress will consider other treatment options/vascular consultation.   Landis Martins, DPM

## 2015-12-13 ENCOUNTER — Telehealth: Payer: Self-pay | Admitting: *Deleted

## 2015-12-13 NOTE — Telephone Encounter (Addendum)
Pt states her leg hurts where it was injured when the last wrap was removed.  Dr. Cannon Kettle states get pt in the [facility] office 12/14/2015, transferred pt to schedulers.  Pt scheduled to see Dr. Cannon Kettle in [place] center 12/14/2015 at 1000am.  12/14/2015 - pt called to speak with Dr. Cannon Kettle.  I spoke with pt she states she is unable to get out of her driveway due to the snow, so she cancelled her appt for today.  Pt states she is having swelling in the top of the foot with the wrappings, and a lot of pain in the top of the foot and the back of the leg where she was injured at last visit.  Pt states she can't see the back of her leg or heel to see if any drainage, but would like to know if she can take the wrapping off.  Pt states she was instructed to keep the wrapping on until seen 12/24/2015, but wondered if she could take it off since it felt tight and apply the antibiotic ointment given by Dr. Cannon Kettle.  I reviewed 12/10/2015 clinicals and pt was put in a unna boot.  I told pt I would contact Dr.Stover and advise her of pt's current status and call with instruction.  Dr. Cannon Kettle orders pt to elevate foot and ice the areas, and take pain medication, if the pain is not relieved in 1 hour, pt is to remove the unna boot and apply Neosporin to the abrasions and make an appt to get to the office as soon as she can.  I informed pt of the instructions and pt states she has been icing and elevating all day, and only has Tylenol.  I told pt to take the Tylenol and go ahead and take the wrap off and apply Neosporin to any of the abrasions or sore spots.

## 2015-12-14 ENCOUNTER — Ambulatory Visit: Payer: Medicare Other | Admitting: Sports Medicine

## 2015-12-21 ENCOUNTER — Ambulatory Visit (INDEPENDENT_AMBULATORY_CARE_PROVIDER_SITE_OTHER): Payer: Medicare Other | Admitting: Sports Medicine

## 2015-12-21 ENCOUNTER — Encounter: Payer: Self-pay | Admitting: Sports Medicine

## 2015-12-21 DIAGNOSIS — L98491 Non-pressure chronic ulcer of skin of other sites limited to breakdown of skin: Secondary | ICD-10-CM

## 2015-12-21 DIAGNOSIS — M79673 Pain in unspecified foot: Secondary | ICD-10-CM | POA: Diagnosis not present

## 2015-12-21 DIAGNOSIS — T148 Other injury of unspecified body region: Secondary | ICD-10-CM

## 2015-12-21 DIAGNOSIS — I739 Peripheral vascular disease, unspecified: Secondary | ICD-10-CM

## 2015-12-21 DIAGNOSIS — T148XXA Other injury of unspecified body region, initial encounter: Secondary | ICD-10-CM

## 2015-12-21 NOTE — Progress Notes (Signed)
Patient ID: [name], female   DOB: 1923/03/09, 82 y.o.   MRN: [id number] Subjective: RICA HEATHER is a 82 y.o. female patient seen in office for follow up evaluation of left foot ulcers and abrasions to calf; patient reports that pain stopped to calf after icing; states that she was able to keep Unna boot on, clean, and dry with no problems. Patient denies nausea/fever/vomiting/chills/night sweats/shortness of breath/calf pain. Patient has no other pedal complaints at this time.  There are no active problems to display for this patient.  Current Outpatient Prescriptions on File Prior to Visit  Medication Sig Dispense Refill  . Acetaminophen (TYLENOL 8 HOUR PO) Take by mouth daily.    Marland Kitchen aspirin 81 MG tablet Take 81 mg by mouth daily.    Marland Kitchen BIOTIN PO Take by mouth daily.    . Calcium Carbonate-Vitamin D (CALCIUM + D PO) Take by mouth daily.    . cephALEXin (KEFLEX) 500 MG capsule Take 1 capsule (500 mg total) by mouth 2 (two) times daily. 28 capsule 0  . Cyanocobalamin (VITAMIN B 12 PO) Take by mouth daily.    . dabigatran (PRADAXA) 150 MG CAPS capsule Take 150 mg by mouth 2 (two) times daily.    Marland Kitchen diltiazem (DILACOR XR) 240 MG 24 hr capsule Take 240 mg by mouth daily.    . fluticasone (VERAMYST) 27.5 MCG/SPRAY nasal spray Place 2 sprays into the nose daily.    . furosemide (LASIX) 40 MG tablet Take 40 mg by mouth daily.    . Glucosamine HCl (GLUCOSAMINE PO) Take by mouth daily.    . Levothyroxine Sodium (SYNTHROID PO) Take by mouth daily.    Marland Kitchen losartan (COZAAR) 50 MG tablet Take 50 mg by mouth daily.    Marland Kitchen MAGNESIUM PO Take by mouth 2 (two) times daily.    . metoprolol succinate (TOPROL-XL) 25 MG 24 hr tablet Take 25 mg by mouth daily.    . Multiple Vitamins-Minerals (VISION-VITE PRESERVE PO) Take by mouth daily.    . mupirocin cream (BACTROBAN) 2 % Apply 1 application topically 2 (two) times daily. 15 g 0  . omeprazole (PRILOSEC) 40 MG capsule Take 40 mg by mouth daily.    .  Potassium Chloride (KLOR-CON PO) Take by mouth daily.    . Red Yeast Rice Extract (RED YEAST RICE PO) Take by mouth daily.    Marland Kitchen tolterodine (DETROL) 1 MG tablet Take 1 mg by mouth 2 (two) times daily.    . TRAMADOL HCL PO Take by mouth daily.    Marland Kitchen VITAMIN A PO Take by mouth daily.     No current facility-administered medications on file prior to visit.   Allergies  Allergen Reactions  . Metronidazole Nausea And Vomiting and Other (See Comments)  . Monistat  [Tioconazole] Rash and Other (See Comments)  . Sulfa Antibiotics Rash and Nausea And Vomiting    Objective:  General: Patient is awake, alert, oriented x 3 and in no acute distress.  Dermatology: Skin is warm and dry bilateral, sub met 1 ulceration on left foot continues to be prematurely closed with friable skin from patient picking at skin adjacent to previous ulcer site with no acute signs of infection.   There is a ulceration at lateral aspect of 4th toe in the 4th webspace that is likely secondary to the toes rubbing and hammertoe deformities that measures 0.1x0.1cm (last measurement 0.1x0.1cm) unchanged from prior with mild keratotic rim and granular base. Ulceration does not probe to bone.  There is no malodor, no drainage, no acute signs of infection. No local erythema. No other signs of infection.   Abrasions to back of calf are superficial with granular tissue present and no signs of infection at calf on left.   To right hallux nail there is dry blood that appears stable with no signs of infection.    Vascular: Dorsalis Pedis pulse = 1/4 Bilateral,  Posterior Tibial pulse = 0/4 Bilateral,  Capillary Fill Time < 5 seconds. + Varicosisties and decreased edema L>R leg and foot.   Neurologic: Epicritic sensation absent to the level of ankle using the 5.07/10g [employer].  Musculosketal: No Pain with palpation to ulcerated areas however there is mild pain to abrasion sites on left calf. No pain with  compression to calves bilateral. Asymptomatic hammertoes deformities noted bilateral.  Assessment and Plan:  Problem List Items Addressed This Visit    None    Visit Diagnoses    Neuropathic ulcer, limited to breakdown of skin ([place])    -  Primary    Resolved    Abrasion        Posterior calf    PVD (peripheral vascular disease) ([place])        Foot pain, unspecified laterality           -Examined patient and discussed the progression of the wound and treatment alternatives -Left Sub met 1 ulcer is prematurely closed and 4th toe ulcer remains open;  To 4th toe and calf abrasion sites applied antibiotic cream secured with ace wrap -Advised patient to change dressings daily applying antibiotic cream to sites. Advised patient to refrain from picking at her foot.  -Since plantar foot ulcer remains healed patient to transition from post op shoe to normal shoe on left -Advised patient to still limit activity to necessity -Advised patient to go to the ER or return to office if the wound worsens or if constitutional symptoms are present. -Advised patient to closely monitor dry blood at right hallux; no acute symptomatology or treatment needed at this time.  -Patient to return to office in 3 weeks for follow up care and evaluation or sooner if problems arise. If fails to progress will consider other treatment options/vascular consultation.   Landis Martins, DPM

## 2015-12-24 ENCOUNTER — Ambulatory Visit: Payer: Medicare Other | Admitting: Sports Medicine

## 2016-01-11 ENCOUNTER — Ambulatory Visit (INDEPENDENT_AMBULATORY_CARE_PROVIDER_SITE_OTHER): Payer: Medicare Other | Admitting: Sports Medicine

## 2016-01-11 ENCOUNTER — Encounter: Payer: Self-pay | Admitting: Sports Medicine

## 2016-01-11 DIAGNOSIS — L98491 Non-pressure chronic ulcer of skin of other sites limited to breakdown of skin: Secondary | ICD-10-CM

## 2016-01-11 DIAGNOSIS — T148 Other injury of unspecified body region: Secondary | ICD-10-CM | POA: Diagnosis not present

## 2016-01-11 DIAGNOSIS — I739 Peripheral vascular disease, unspecified: Secondary | ICD-10-CM | POA: Diagnosis not present

## 2016-01-11 DIAGNOSIS — M79673 Pain in unspecified foot: Secondary | ICD-10-CM

## 2016-01-11 DIAGNOSIS — B351 Tinea unguium: Secondary | ICD-10-CM

## 2016-01-11 DIAGNOSIS — T148XXA Other injury of unspecified body region, initial encounter: Secondary | ICD-10-CM

## 2016-01-11 NOTE — Progress Notes (Addendum)
Patient ID: [name], female   DOB: October 12, 1923, 82 y.o.   MRN: [id number] Subjective: [name] is a 82 y.o. female patient seen in office for follow up evaluation of left foot ulcers and abrasions to calf and new abrasion to tip of left toe that happened after patient tried to cut nail. Patient denies nausea/fever/vomiting/chills/night sweats/shortness of breath/calf pain. Patient has no other pedal complaints at this time.  There are no active problems to display for this patient.  Current Outpatient Prescriptions on File Prior to Visit  Medication Sig Dispense Refill  . Acetaminophen (TYLENOL 8 HOUR PO) Take by mouth daily.    Marland Kitchen aspirin 81 MG tablet Take 81 mg by mouth daily.    Marland Kitchen BIOTIN PO Take by mouth daily.    . Calcium Carbonate-Vitamin D (CALCIUM + D PO) Take by mouth daily.    . cephALEXin (KEFLEX) 500 MG capsule Take 1 capsule (500 mg total) by mouth 2 (two) times daily. 28 capsule 0  . Cyanocobalamin (VITAMIN B 12 PO) Take by mouth daily.    . dabigatran (PRADAXA) 150 MG CAPS capsule Take 150 mg by mouth 2 (two) times daily.    Marland Kitchen diltiazem (DILACOR XR) 240 MG 24 hr capsule Take 240 mg by mouth daily.    . fluticasone (VERAMYST) 27.5 MCG/SPRAY nasal spray Place 2 sprays into the nose daily.    . furosemide (LASIX) 40 MG tablet Take 40 mg by mouth daily.    . Glucosamine HCl (GLUCOSAMINE PO) Take by mouth daily.    . Levothyroxine Sodium (SYNTHROID PO) Take by mouth daily.    Marland Kitchen losartan (COZAAR) 50 MG tablet Take 50 mg by mouth daily.    Marland Kitchen MAGNESIUM PO Take by mouth 2 (two) times daily.    . metoprolol succinate (TOPROL-XL) 25 MG 24 hr tablet Take 25 mg by mouth daily.    . Multiple Vitamins-Minerals (VISION-VITE PRESERVE PO) Take by mouth daily.    . mupirocin cream (BACTROBAN) 2 % Apply 1 application topically 2 (two) times daily. 15 g 0  . omeprazole (PRILOSEC) 40 MG capsule Take 40 mg by mouth daily.    . Potassium Chloride (KLOR-CON PO) Take by mouth daily.     . Red Yeast Rice Extract (RED YEAST RICE PO) Take by mouth daily.    Marland Kitchen tolterodine (DETROL) 1 MG tablet Take 1 mg by mouth 2 (two) times daily.    . TRAMADOL HCL PO Take by mouth daily.    Marland Kitchen VITAMIN A PO Take by mouth daily.     No current facility-administered medications on file prior to visit.   Allergies  Allergen Reactions  . Metronidazole Nausea And Vomiting and Other (See Comments)  . Monistat  [Tioconazole] Rash and Other (See Comments)  . Sulfa Antibiotics Rash and Nausea And Vomiting    Objective:  General: Patient is awake, alert, oriented x 3 and in no acute distress.  Dermatology: Skin is warm and dry bilateral, sub met 1 ulceration is healed with no acute signs of infection.   There is a ulceration at lateral aspect of 4th toe in the 4th webspace that is likely secondary to the toes rubbing and hammertoe deformities that measures 0.1x0.1cm (last measurement 0.1x0.1cm) unchanged from prior with mild keratotic rim and granular base. Ulceration does not probe to bone. There is no malodor, no drainage, no acute signs of infection. No local erythema. No other signs of infection.   Abrasions to back of calf are prematurely  healed with no signs of infection at calf on left.   Abrasion to left 2nd toe with no surrounding signs of infection, superficial in nature.   To right hallux nail there is dry blood that appears stable with no signs of infection. All remaining nails are mildly elongated, discolored, and thickened.   Vascular: Dorsalis Pedis pulse = 1/4 Bilateral,  Posterior Tibial pulse = 0/4 Bilateral,  Capillary Fill Time < 5 seconds. + Varicosisties and decreased edema both legs and feet.   Neurologic: Epicritic sensation absent to the level of ankle using the 5.07/10g [employer].  Musculosketal: No Pain with palpation to ulcerated area or abrasion sites on left. No pain with compression to calves bilateral. Rigid  hammertoes deformities noted  bilateral.  Assessment and Plan:  Problem List Items Addressed This Visit    None    Visit Diagnoses    Neuropathic ulcer, limited to breakdown of skin ([place])    -  Primary    Abrasion        PVD (peripheral vascular disease) (HCC)        Dermatophytosis of nail        Foot pain, unspecified laterality          -Examined patient and discussed the progression of the wound and treatment alternatives -Advised patient to refrain from attempting to pick skin and trim her own nails; She is neuropathic and this is a concerning risk factor for worsening of ulceration and infection in the leg,  If she continues to self trim and medicate. -Mechanically debrided nails 10 using sterile nail without incident -Left Sub met 1 ulcer is healed and 4th toe ulcer remains open but stable;  To 4th toe gave and recommend silicone spacer and antibiotic cream as needed -To left abrasion sites. Recommend close monitoring and applying antibiotic cream as needed. -Continue with good supportive shoes would highly recommend patient to be considered for extra-depth shoes with inserts due to patient's significant history of ulceration cellulitis and neuropathy with foot deformity, consistent of hammertoes, which predisposes her to developing ulceration. Will proceed with submitting medical necessity and request for shoes. -Advised patient cont with activity to necessity -Advised patient to go to the ER or return to office if the wound/abrasions worsens or if constitutional symptoms are present. -Advised patient to closely monitor dry blood at right hallux; no acute symptomatology or treatment needed at this time.  -Patient to return to office in 6 weeks for follow up care and evaluation or sooner if problems arise. If fails to progress will consider other treatment options/vascular consultation.   Landis Martins, DPM

## 2016-02-25 ENCOUNTER — Ambulatory Visit: Payer: Medicare Other | Admitting: Sports Medicine

## 2016-02-25 ENCOUNTER — Ambulatory Visit (INDEPENDENT_AMBULATORY_CARE_PROVIDER_SITE_OTHER): Payer: Medicare Other | Admitting: Sports Medicine

## 2016-02-25 ENCOUNTER — Encounter: Payer: Self-pay | Admitting: Sports Medicine

## 2016-02-25 DIAGNOSIS — G629 Polyneuropathy, unspecified: Secondary | ICD-10-CM

## 2016-02-25 DIAGNOSIS — M79672 Pain in left foot: Secondary | ICD-10-CM

## 2016-02-25 DIAGNOSIS — G8929 Other chronic pain: Secondary | ICD-10-CM | POA: Insufficient documentation

## 2016-02-25 DIAGNOSIS — I495 Sick sinus syndrome: Secondary | ICD-10-CM | POA: Insufficient documentation

## 2016-02-25 DIAGNOSIS — I639 Cerebral infarction, unspecified: Secondary | ICD-10-CM | POA: Insufficient documentation

## 2016-02-25 DIAGNOSIS — L84 Corns and callosities: Secondary | ICD-10-CM | POA: Diagnosis not present

## 2016-02-25 DIAGNOSIS — I739 Peripheral vascular disease, unspecified: Secondary | ICD-10-CM | POA: Diagnosis not present

## 2016-02-25 DIAGNOSIS — M79671 Pain in right foot: Secondary | ICD-10-CM

## 2016-02-25 DIAGNOSIS — M549 Dorsalgia, unspecified: Secondary | ICD-10-CM

## 2016-02-25 DIAGNOSIS — B351 Tinea unguium: Secondary | ICD-10-CM | POA: Diagnosis not present

## 2016-02-25 DIAGNOSIS — M810 Age-related osteoporosis without current pathological fracture: Secondary | ICD-10-CM | POA: Insufficient documentation

## 2016-02-25 DIAGNOSIS — T7840XA Allergy, unspecified, initial encounter: Secondary | ICD-10-CM | POA: Insufficient documentation

## 2016-02-25 DIAGNOSIS — E785 Hyperlipidemia, unspecified: Secondary | ICD-10-CM | POA: Insufficient documentation

## 2016-02-25 NOTE — Progress Notes (Signed)
Patient ID: [name], female   DOB: September 23, 1923, 82 y.o.   MRN: [id number]   Subjective: [name] is a 82 y.o. female patient seen in office for follow up evaluation of left foot ulcers and abrasions. Patient states that she is doing well. None of the ulcerations have opened up or came back. Patient denies nausea/fever/vomiting/chills/night sweats/shortness of breath/calf pain. Patient desires to have nails trimmed. Patient has no other pedal complaints at this time.  Patient Active Problem List   Diagnosis Date Noted  . Allergic state 02/25/2016  . Back pain, chronic 02/25/2016  . HLD (hyperlipidemia) 02/25/2016  . 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 Sick sinus syndrome ([place]) 02/25/2016  . Cerebrovascular accident (CVA) ([place]) 02/25/2016  . 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 Acquired hypothyroidism 05/04/2015  . H/O respiratory system disease 05/04/2015  . Paroxysmal atrial fibrillation ([place]) 12/30/2014  . Finger injury 11/29/2014   Current Outpatient Prescriptions on File Prior to Visit  Medication Sig Dispense Refill  . Acetaminophen (TYLENOL 8 HOUR PO) Take by mouth daily.    Marland Kitchen aspirin 81 MG tablet Take 81 mg by mouth daily.    Marland Kitchen BIOTIN PO Take by mouth daily.    . Calcium Carbonate-Vitamin D (CALCIUM + D PO) Take by mouth daily.    . cephALEXin (KEFLEX) 500 MG capsule Take 1 capsule (500 mg total) by mouth 2 (two) times daily. 28 capsule 0  . Cyanocobalamin (VITAMIN B 12 PO) Take by mouth daily.    . dabigatran (PRADAXA) 150 MG CAPS capsule Take 150 mg by mouth 2 (two) times daily.    Marland Kitchen diltiazem (DILACOR XR) 240 MG 24 hr capsule Take 240 mg by mouth daily.    . fluticasone (VERAMYST) 27.5 MCG/SPRAY nasal spray Place 2 sprays into the nose daily.    . furosemide (LASIX) 40 MG tablet Take 40 mg by mouth daily.    . Glucosamine HCl (GLUCOSAMINE PO) Take by mouth daily.    . Levothyroxine Sodium (SYNTHROID PO) Take by mouth daily.    Marland Kitchen losartan  (COZAAR) 50 MG tablet Take 50 mg by mouth daily.    Marland Kitchen MAGNESIUM PO Take by mouth 2 (two) times daily.    . metoprolol succinate (TOPROL-XL) 25 MG 24 hr tablet Take 25 mg by mouth daily.    . Multiple Vitamins-Minerals (VISION-VITE PRESERVE PO) Take by mouth daily.    . mupirocin cream (BACTROBAN) 2 % Apply 1 application topically 2 (two) times daily. 15 g 0  . omeprazole (PRILOSEC) 40 MG capsule Take 40 mg by mouth daily.    . Potassium Chloride (KLOR-CON PO) Take by mouth daily.    . Red Yeast Rice Extract (RED YEAST RICE PO) Take by mouth daily.    Marland Kitchen tolterodine (DETROL) 1 MG tablet Take 1 mg by mouth 2 (two) times daily.    . TRAMADOL HCL PO Take by mouth daily.    Marland Kitchen VITAMIN A PO Take by mouth daily.     No current facility-administered medications on file prior to visit.   Allergies  Allergen Reactions  . Metronidazole Nausea And Vomiting and Other (See Comments)  . Monistat  [Tioconazole] Rash and Other (See Comments)  . Sulfa Antibiotics Rash and Nausea And Vomiting    Objective:  General: Patient is awake, alert, oriented x 3 and in no acute distress.  Dermatology: Skin is warm and dry bilateral, sub met 1 ulceration is healed now with minimal callus to site with no acute signs of  infection.   There is a stable pinpoint ulceration at lateral aspect of 4th toe in the 4th webspace that is likely secondary to the toes rubbing and hammertoe deformities that measures 0.1x0.1cm (last measurement 0.1x0.1cm) unchanged from prior with mild keratotic rim and granular base. Ulceration does not probe to bone. There is no malodor, no drainage, no acute signs of infection. No local erythema. No other signs of infection.   Abrasions bilateral are resolved  Nails x 10 are mildly elongated, discolored, and thickened consistent with onychomycosis.   Vascular: Dorsalis Pedis pulse = 1/4 Bilateral,  Posterior Tibial pulse = 0/4 Bilateral,  Capillary Fill Time < 5 seconds. + Varicosisties and  decreased edema both legs and feet.   Neurologic: Epicritic sensation absent to the level of ankle using the 5.07/10g [employer].  Musculosketal: No Pain with palpation bilateral. No pain with compression to calves bilateral. Rigid  hammertoes deformities noted bilateral.  Assessment and Plan:  Problem List Items Addressed This Visit    None    Visit Diagnoses    Pre-ulcerative corn or callous    -  Primary    Dermatophytosis of nail        PVD (peripheral vascular disease) (HCC)        Foot pain, bilateral        Neuropathy (HCC)          -Examined patient and discussed plan of care -Mechanically debrided nails 10 using sterile nail without incident -Left Sub met 1 ulcer is healed with minimal callus thus pared using a sterile blade without incident. The 4th toe ulcer remains with a pinpoint opening but stable; Cont with toe spacer and close monitoring -Continue with good supportive shoes; patient awaiting extra depth shoes -Advised patient to go to the ER or return to office if her feet worsen or if constitutional symptoms are present.  -Patient to return to office in 12 weeks for follow up care and evaluation or sooner if problems arise.   Landis Martins, DPM

## 2016-04-07 ENCOUNTER — Other Ambulatory Visit: Payer: Self-pay | Admitting: Infectious Diseases

## 2016-04-07 DIAGNOSIS — R9389 Abnormal findings on diagnostic imaging of other specified body structures: Secondary | ICD-10-CM | POA: Insufficient documentation

## 2016-04-07 DIAGNOSIS — R0602 Shortness of breath: Secondary | ICD-10-CM

## 2016-04-11 ENCOUNTER — Ambulatory Visit
Admission: RE | Admit: 2016-04-11 | Discharge: 2016-04-11 | Disposition: A | Discharge status: Home / Self Care | Payer: Medicare Other | Source: Ambulatory Visit | Attending: Infectious Diseases | Admitting: Infectious Diseases

## 2016-04-11 DIAGNOSIS — I700 Atherosclerosis of aorta: Secondary | ICD-10-CM | POA: Insufficient documentation

## 2016-04-11 DIAGNOSIS — J984 Other disorders of lung: Secondary | ICD-10-CM | POA: Insufficient documentation

## 2016-04-11 DIAGNOSIS — R918 Other nonspecific abnormal finding of lung field: Secondary | ICD-10-CM | POA: Diagnosis not present

## 2016-04-11 DIAGNOSIS — I2510 Atherosclerotic heart disease of native coronary artery without angina pectoris: Secondary | ICD-10-CM | POA: Insufficient documentation

## 2016-04-11 DIAGNOSIS — R0602 Shortness of breath: Secondary | ICD-10-CM

## 2016-04-11 DIAGNOSIS — E041 Nontoxic single thyroid nodule: Secondary | ICD-10-CM | POA: Diagnosis not present

## 2016-04-11 DIAGNOSIS — R9389 Abnormal findings on diagnostic imaging of other specified body structures: Secondary | ICD-10-CM

## 2016-04-13 ENCOUNTER — Other Ambulatory Visit: Payer: Self-pay | Admitting: Specialist

## 2016-04-13 DIAGNOSIS — R918 Other nonspecific abnormal finding of lung field: Secondary | ICD-10-CM

## 2016-05-10 DIAGNOSIS — R300 Dysuria: Secondary | ICD-10-CM | POA: Insufficient documentation

## 2016-05-26 ENCOUNTER — Ambulatory Visit: Payer: Medicare Other | Admitting: Sports Medicine

## 2016-06-09 ENCOUNTER — Encounter: Payer: Self-pay | Admitting: Sports Medicine

## 2016-06-09 ENCOUNTER — Ambulatory Visit (INDEPENDENT_AMBULATORY_CARE_PROVIDER_SITE_OTHER): Payer: Medicare Other | Admitting: Sports Medicine

## 2016-06-09 DIAGNOSIS — G629 Polyneuropathy, unspecified: Secondary | ICD-10-CM

## 2016-06-09 DIAGNOSIS — B351 Tinea unguium: Secondary | ICD-10-CM

## 2016-06-09 DIAGNOSIS — L84 Corns and callosities: Secondary | ICD-10-CM

## 2016-06-09 DIAGNOSIS — M79671 Pain in right foot: Secondary | ICD-10-CM | POA: Diagnosis not present

## 2016-06-09 DIAGNOSIS — M79672 Pain in left foot: Secondary | ICD-10-CM

## 2016-06-09 DIAGNOSIS — I739 Peripheral vascular disease, unspecified: Secondary | ICD-10-CM

## 2016-06-09 NOTE — Progress Notes (Signed)
Patient ID: [name], female   DOB: March 14, 1923, 82 y.o.   MRN: [id number]  Subjective: [name] is a 82 y.o. female patient seen in office for follow up evaluation of pre-ulcerative area(s) and for routine foot care. Patient admits that she has had a few areas of dry skin that she has picked at but no recurrence of ulceration. Patient has no other pedal complaints at this time.  Patient Active Problem List   Diagnosis Date Noted  . Difficult or painful urination 05/10/2016  . Abnormal chest x-ray 04/07/2016  . Breath shortness 04/07/2016  . Allergic state 02/25/2016  . Back pain, chronic 02/25/2016  . HLD (hyperlipidemia) 02/25/2016  . 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 Sick sinus syndrome ([place]) 02/25/2016  . Cerebrovascular accident (CVA) ([place]) 02/25/2016  . 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 Acquired hypothyroidism 05/04/2015  . H/O respiratory system disease 05/04/2015  . Paroxysmal atrial fibrillation ([place]) 12/30/2014  . Finger injury 11/29/2014   Current Outpatient Prescriptions on File Prior to Visit  Medication Sig Dispense Refill  . Acetaminophen (TYLENOL 8 HOUR PO) Take by mouth daily.    Marland Kitchen aspirin 81 MG tablet Take 81 mg by mouth daily.    Marland Kitchen BIOTIN PO Take by mouth daily.    . Calcium Carbonate-Vitamin D (CALCIUM + D PO) Take by mouth daily.    . cephALEXin (KEFLEX) 500 MG capsule Take 1 capsule (500 mg total) by mouth 2 (two) times daily. 28 capsule 0  . Cyanocobalamin (VITAMIN B 12 PO) Take by mouth daily.    . dabigatran (PRADAXA) 150 MG CAPS capsule Take 150 mg by mouth 2 (two) times daily.    Marland Kitchen diltiazem (DILACOR XR) 240 MG 24 hr capsule Take 240 mg by mouth daily.    . fluticasone (VERAMYST) 27.5 MCG/SPRAY nasal spray Place 2 sprays into the nose daily.    . furosemide (LASIX) 40 MG tablet Take 40 mg by mouth daily.    . Glucosamine HCl (GLUCOSAMINE PO) Take by mouth daily.    . Levothyroxine Sodium (SYNTHROID PO) Take by mouth  daily.    Marland Kitchen losartan (COZAAR) 50 MG tablet Take 50 mg by mouth daily.    Marland Kitchen MAGNESIUM PO Take by mouth 2 (two) times daily.    . metoprolol succinate (TOPROL-XL) 25 MG 24 hr tablet Take 25 mg by mouth daily.    . Multiple Vitamins-Minerals (VISION-VITE PRESERVE PO) Take by mouth daily.    . mupirocin cream (BACTROBAN) 2 % Apply 1 application topically 2 (two) times daily. 15 g 0  . omeprazole (PRILOSEC) 40 MG capsule Take 40 mg by mouth daily.    . Potassium Chloride (KLOR-CON PO) Take by mouth daily.    . Red Yeast Rice Extract (RED YEAST RICE PO) Take by mouth daily.    Marland Kitchen tolterodine (DETROL) 1 MG tablet Take 1 mg by mouth 2 (two) times daily.    . TRAMADOL HCL PO Take by mouth daily.    Marland Kitchen VITAMIN A PO Take by mouth daily.     No current facility-administered medications on file prior to visit.   Allergies  Allergen Reactions  . Metronidazole Nausea And Vomiting, Other (See Comments) and Rash  . Monistat  [Tioconazole] Rash and Other (See Comments)  . Sulfa Antibiotics Rash and Nausea And Vomiting    Objective:  General: Patient is awake, alert, oriented x 3 and in no acute distress.  Dermatology: Skin is warm and dry bilateral, sub met 1 ulceration remains  healed minimal hemorraghic callus to site that is pre-ulcerative in nature with no acute signs of infection.   Left lateral 4th toe ulceration healed.   Abrasions bilateral are resolved  Nails x 10 are mildly elongated, discolored, and thickened consistent with onychomycosis.   Vascular: Dorsalis Pedis pulse = 1/4 Bilateral,  Posterior Tibial pulse = 0/4 Bilateral,  Capillary Fill Time < 5 seconds. + Varicosisties and decreased edema both legs and feet.   Neurologic: Epicritic sensation absent to the level of ankle using the 5.07/10g [employer].  Musculosketal: No Pain with palpation bilateral. No pain with compression to calves bilateral. Rigid  hammertoes deformities noted bilateral.  Assessment and  Plan:  Problem List Items Addressed This Visit    None    Visit Diagnoses    Pre-ulcerative corn or callous    -  Primary    Dermatophytosis of nail        PVD (peripheral vascular disease) (HCC)        Neuropathy (HCC)        Foot pain, bilateral          -Examined patient and discussed plan of care -Mechanically debrided nails 10 using sterile nail without incident -Left Sub met 1 ulcer remains healed with minimal callus thus pared using a sterile blade without incident. All other ulceration and abrasions are resolved.  -Continue with good supportive extra depth shoes -Advised patient to go to the ER or return to office if her feet worsen or if constitutional symptoms are present.  -Patient to return to office in 12 weeks for follow up care and evaluation or sooner if problems arise.   [name], DPM

## 2016-07-13 ENCOUNTER — Ambulatory Visit
Admission: RE | Admit: 2016-07-13 | Discharge: 2016-07-13 | Disposition: A | Discharge status: Home / Self Care | Payer: Medicare Other | Source: Ambulatory Visit | Attending: Specialist | Admitting: Specialist

## 2016-07-13 DIAGNOSIS — I2510 Atherosclerotic heart disease of native coronary artery without angina pectoris: Secondary | ICD-10-CM | POA: Diagnosis not present

## 2016-07-13 DIAGNOSIS — I700 Atherosclerosis of aorta: Secondary | ICD-10-CM | POA: Diagnosis not present

## 2016-07-13 DIAGNOSIS — R918 Other nonspecific abnormal finding of lung field: Secondary | ICD-10-CM | POA: Diagnosis not present

## 2016-07-24 ENCOUNTER — Ambulatory Visit (INDEPENDENT_AMBULATORY_CARE_PROVIDER_SITE_OTHER): Payer: Medicare Other | Admitting: Podiatry

## 2016-07-24 ENCOUNTER — Encounter: Payer: Self-pay | Admitting: Podiatry

## 2016-07-24 ENCOUNTER — Ambulatory Visit: Payer: Medicare Other | Admitting: Podiatry

## 2016-07-24 DIAGNOSIS — L89891 Pressure ulcer of other site, stage 1: Secondary | ICD-10-CM

## 2016-07-24 DIAGNOSIS — L84 Corns and callosities: Secondary | ICD-10-CM

## 2016-07-24 DIAGNOSIS — I739 Peripheral vascular disease, unspecified: Secondary | ICD-10-CM

## 2016-07-24 MED ORDER — MUPIROCIN 2 % EX OINT: TOPICAL_OINTMENT | CUTANEOUS | 2 refills | Status: DC

## 2016-07-24 NOTE — Progress Notes (Signed)
She presents today for chief complaint of painful elongated toenails and a painful area to the plantar aspect of the first metatarsophalangeal joint of the left foot which seems to be draining. She denies fever chills nausea vomiting muscle aches and pains.  Objective: Vital signs are stable alert and oriented 3. Pulses are strongly palpable. Neurologic sensorium is intact. Reactive hyperkeratosis plantar aspect of the first metatarsophalangeal joint demonstrates a reactive hyperkeratotic lesion was debrided demonstrates a superficial ulceration measuring proximally 5 mm in diameter and 2 mm in depth. There is no sign appear insert drainage. She also has a painful superficial lesion that has been present for the past 2 years to her lateral ankle right and has not made a follow-up appointment with Dr. dew for vascular evaluation. She is to follow up with him in the near future at my instruction. Her toenails are thick yellow dystrophic Lee mycotic and painful palpation.  Assessment: I debrided nails 1 through 5 bilaterally covered service secondary to onychomycosis. I debrided reactive hyperkeratoses to bleeding today. Encouraged her to start addressing this on a daily basis and stay off the foot as much as possible. She will follow up with me in a few weeks at which time we will reassess. I also prescribed Bactroban ointment to be applied daily. She will call with symptoms of infection or worsening conditions.

## 2016-08-16 ENCOUNTER — Encounter (INDEPENDENT_AMBULATORY_CARE_PROVIDER_SITE_OTHER): Payer: Self-pay

## 2016-09-06 ENCOUNTER — Encounter: Payer: Self-pay | Admitting: Podiatry

## 2016-09-06 ENCOUNTER — Ambulatory Visit (INDEPENDENT_AMBULATORY_CARE_PROVIDER_SITE_OTHER): Payer: Medicare Other | Admitting: Podiatry

## 2016-09-06 ENCOUNTER — Ambulatory Visit: Payer: Medicare Other | Admitting: Podiatry

## 2016-09-06 DIAGNOSIS — L84 Corns and callosities: Secondary | ICD-10-CM

## 2016-09-06 DIAGNOSIS — L97501 Non-pressure chronic ulcer of other part of unspecified foot limited to breakdown of skin: Secondary | ICD-10-CM

## 2016-09-06 DIAGNOSIS — I739 Peripheral vascular disease, unspecified: Secondary | ICD-10-CM

## 2016-09-06 NOTE — Progress Notes (Signed)
She presents today for follow-up of ulceration sub-first metatarsophalangeal joint of the left foot. She denies fever chills nausea vomiting states this seems to be doing quite well. She states that she goes to see Dr. Lucky Cowboy this month for her right foot.  Objective: Vital signs are stable she's alert and oriented 3. Pulses are palpable. Left sub-first metatarsophalangeal joint demonstrates reactive hyperkeratosis was debrided does not demonstrate any type of open wound. It appears to have resolved 100%. Right ankle does demonstrate an arterial ulceration being taken care of by vascular department.  Assessment: Arterial ulcer fibular right. Well-healing ulceration plantar aspect left foot.  Plan: Debridement of the wound today left. I encouraged to apply a small amount of Bactroban ointment which she has at home to the ulcer on the right ankle and continue to follow with vascular.

## 2016-09-12 ENCOUNTER — Encounter (INDEPENDENT_AMBULATORY_CARE_PROVIDER_SITE_OTHER): Payer: Self-pay | Admitting: Vascular Surgery

## 2016-09-12 ENCOUNTER — Ambulatory Visit (INDEPENDENT_AMBULATORY_CARE_PROVIDER_SITE_OTHER): Payer: Medicare Other

## 2016-09-12 ENCOUNTER — Other Ambulatory Visit (INDEPENDENT_AMBULATORY_CARE_PROVIDER_SITE_OTHER): Payer: Self-pay | Admitting: Vascular Surgery

## 2016-09-12 ENCOUNTER — Ambulatory Visit (INDEPENDENT_AMBULATORY_CARE_PROVIDER_SITE_OTHER): Payer: Medicare Other | Admitting: Vascular Surgery

## 2016-09-12 VITALS — BP 134/78 | HR 97 | Resp 17 | Ht 64.5 in | Wt 164.0 lb

## 2016-09-12 DIAGNOSIS — M7989 Other specified soft tissue disorders: Secondary | ICD-10-CM

## 2016-09-12 DIAGNOSIS — L97311 Non-pressure chronic ulcer of right ankle limited to breakdown of skin: Secondary | ICD-10-CM | POA: Diagnosis not present

## 2016-09-12 DIAGNOSIS — I480 Paroxysmal atrial fibrillation: Secondary | ICD-10-CM

## 2016-09-12 DIAGNOSIS — L97919 Non-pressure chronic ulcer of unspecified part of right lower leg with unspecified severity: Secondary | ICD-10-CM

## 2016-09-12 DIAGNOSIS — I639 Cerebral infarction, unspecified: Secondary | ICD-10-CM

## 2016-09-12 DIAGNOSIS — I739 Peripheral vascular disease, unspecified: Secondary | ICD-10-CM

## 2016-09-12 DIAGNOSIS — L97309 Non-pressure chronic ulcer of unspecified ankle with unspecified severity: Secondary | ICD-10-CM | POA: Diagnosis not present

## 2016-09-12 DIAGNOSIS — I83009 Varicose veins of unspecified lower extremity with ulcer of unspecified site: Secondary | ICD-10-CM | POA: Insufficient documentation

## 2016-09-12 DIAGNOSIS — E785 Hyperlipidemia, unspecified: Secondary | ICD-10-CM

## 2016-09-12 DIAGNOSIS — L97909 Non-pressure chronic ulcer of unspecified part of unspecified lower leg with unspecified severity: Secondary | ICD-10-CM

## 2016-09-12 DIAGNOSIS — L97319 Non-pressure chronic ulcer of right ankle with unspecified severity: Secondary | ICD-10-CM

## 2016-09-12 DIAGNOSIS — L97519 Non-pressure chronic ulcer of other part of right foot with unspecified severity: Secondary | ICD-10-CM

## 2016-09-12 DIAGNOSIS — L97421 Non-pressure chronic ulcer of left heel and midfoot limited to breakdown of skin: Secondary | ICD-10-CM

## 2016-09-12 DIAGNOSIS — I83019 Varicose veins of right lower extremity with ulcer of unspecified site: Secondary | ICD-10-CM

## 2016-09-12 NOTE — Progress Notes (Signed)
Patient ID: [name], female   DOB: 18-Dec-1922, 82 y.o.   MRN: [id number]  Chief Complaint  Patient presents with  . Follow-up    ultrasound    HPI [name] is a 82 y.o. female.  Patient returns in follow up of their venous disease. She has a persistent ulceration of the right lateral ankle area. This is improving his gotten slightly smaller and is superficial without signs of infection, but does persist. They have done their best to comply with the prescribed conservative therapies of compression stockings, leg elevation, exercise, and still requires anti-inflammatories for discomfort and has symptoms that are persistent and bothersome on a daily basis, affecting their activities of daily living and normal activities.  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 No great saphenous vein or deep venous reflux was identified.   HPI  Past Medical History:  Diagnosis Date  . Peripheral vascular disease ([place])   . Varicose veins     Past Surgical History:  Procedure Laterality Date  . APPENDECTOMY    . BASAL CELL CARCINOMA EXCISION Left   . BREAST CYST EXCISION Left   . CATARACT EXTRACTION Left   . CHOLECYSTECTOMY    . EYE SURGERY    . PACEMAKER PLACEMENT    . TONSILLECTOMY      Family History  Problem Relation Age of Onset  . Stroke Mother   . Cancer Father    No bleeding or clotting disorders  Social History Social History  Substance Use Topics  . Smoking status: Never Smoker  . Smokeless tobacco: Not on file  . Alcohol use No   Married, lives with husband  Allergies  Allergen Reactions  . Metronidazole Nausea And Vomiting, Other (See Comments) and Rash  . Monistat  [Tioconazole] Rash and Other (See Comments)  . Sulfa Antibiotics Rash and Nausea And Vomiting    Current Outpatient Prescriptions  Medication Sig Dispense  Refill  . Acetaminophen (TYLENOL 8 HOUR PO) Take by mouth daily.    Marland Kitchen aspirin 81 MG tablet Take 81 mg by mouth daily.    Marland Kitchen BIOTIN PO Take by mouth daily.    . Calcium Carbonate-Vitamin D (CALCIUM + D PO) Take by mouth daily.    . Cyanocobalamin (VITAMIN B 12 PO) Take by mouth daily.    . dabigatran (PRADAXA) 150 MG CAPS capsule Take 150 mg by mouth 2 (two) times daily.    Marland Kitchen diltiazem (DILACOR XR) 240 MG 24 hr capsule Take 240 mg by mouth daily.    . fluticasone (VERAMYST) 27.5 MCG/SPRAY nasal spray Place 2 sprays into the nose daily.    . furosemide (LASIX) 40 MG tablet Take 40 mg by mouth daily.    . Glucosamine HCl (GLUCOSAMINE PO) Take by mouth daily.    Marland Kitchen losartan (COZAAR) 50 MG tablet Take 50 mg by mouth daily.    Marland Kitchen MAGNESIUM PO Take by mouth 2 (two) times daily.    . metoprolol succinate (TOPROL-XL) 25 MG 24 hr tablet Take 25 mg by mouth daily.    . Multiple Vitamins-Minerals (VISION-VITE PRESERVE PO) Take by mouth daily.    . mupirocin ointment (BACTROBAN) 2 % Apply to wound twice a day. 30 g 2  . Potassium Chloride (KLOR-CON PO) Take by mouth daily.    . Red Yeast Rice Extract (RED YEAST RICE PO) Take by mouth daily.    Marland Kitchen  tolterodine (DETROL) 1 MG tablet Take 1 mg by mouth 2 (two) times daily.    . TRAMADOL HCL PO Take by mouth daily.    Marland Kitchen VITAMIN A PO Take by mouth daily.    Marland Kitchen CARTIA XT 300 MG 24 hr capsule     . levothyroxine (SYNTHROID, LEVOTHROID) 50 MCG tablet     . omeprazole (PRILOSEC) 20 MG capsule     . omeprazole (PRILOSEC) 40 MG capsule Take 40 mg by mouth daily.     No current facility-administered medications for this visit.       REVIEW OF SYSTEMS (Negative unless checked)  Constitutional: [] Weight loss  [] Fever  [] Chills Cardiac: [] Chest pain   [] Chest pressure   [x] Palpitations   [] Shortness of breath when laying flat   [] Shortness of breath at rest   [] Shortness of breath with exertion. Vascular:  [] Pain in legs with walking   [] Pain in legs at rest    [] Pain in legs when laying flat   [] Claudication   [] Pain in feet when walking  [] Pain in feet at rest  [] Pain in feet when laying flat   [] History of DVT   [] Phlebitis   [x] Swelling in legs   [x] Varicose veins   [x] Non-healing ulcers Pulmonary:   [] Uses home oxygen   [] Productive cough   [] Hemoptysis   [] Wheeze  [] COPD   [] Asthma Neurologic:  [] Dizziness  [] Blackouts   [] Seizures   [] History of stroke   [] History of TIA  [] Aphasia   [] Temporary blindness   [] Dysphagia   [] Weakness or numbness in arms   [] Weakness or numbness in legs Musculoskeletal:  [] Arthritis   [] Joint swelling   [] Joint pain   [] Low back pain Hematologic:  [] Easy bruising  [] Easy bleeding   [] Hypercoagulable state   [] Anemic  [] Hepatitis Gastrointestinal:  [] Blood in stool   [] Vomiting blood  [] Gastroesophageal reflux/heartburn   [] Abdominal pain Genitourinary:  [] Chronic kidney disease   [] Difficult urination  [] Frequent urination  [] Burning with urination   [] Hematuria Skin:  [] Rashes   [x] Ulcers   [x] Wounds Psychological:  [] History of anxiety   []  History of major depression.    Physical Exam BP 134/78   Pulse 97   Resp 17   Ht 5' 4.5" (1.638 m)   Wt 164 lb (74.4 kg)   BMI 27.72 kg/m  Gen:  WD/WN, NAD. Appears younger than stated age Head: [place]/AT, No temporalis wasting.  Ear/Nose/Throat: Hearing grossly intact, trachea midline Eyes: Sclera non-icteric. Conjunctiva clear Neck: Supple, no nuchal rigidity. Trachea midline Pulmonary:  Good air movement, no use of accessory muscles, respirations not labored.  Cardiac: Irregularly irregular Vascular: Varicosities diffuse and measuring up to 4-5 mm in the right lower extremity        Varicosities scattered and measuring up to 2-3 mm in the left lower extremity Vessel Right Left  Radial Palpable Palpable  Ulnar Palpable Palpable  Brachial Palpable Palpable  Carotid Palpable, without bruit Palpable, without bruit  Aorta Not palpable N/A  Femoral Palpable Palpable    Popliteal Palpable Palpable  PT 1+ Palpable Trace Palpable  DP Palpable Palpable   Gastrointestinal: soft, non-tender/non-distended. No guarding/reflex. No masses, surgical incisions, or scars. Musculoskeletal: M/S 5/5 throughout.   2 + RLE edema.  1 + LLE edema Neurologic: CN 2-12 intact. Pain and light touch intact in extremities.  Symmetrical.  Speech is fluent.  Psychiatric: Judgment intact, Mood & affect appropriate for pt's clinical situation. Dermatologic: Right lateral ankle ulceration clean without signs of erythema or purulent  drainage Lymph : No Cervical, Axillary, or Inguinal lymphadenopathy.  Data Reviewed  Radiology No results found.  Labs No results found for this or any previous visit (from the past 2160 hour(s)).  Assessment/Plan:  HLD (hyperlipidemia) lipid control important in reducing the progression of atherosclerotic disease.    Paroxysmal atrial fibrillation (HCC) On anticoagulation  PVD (peripheral vascular disease) ([place]) Perfusion checked earlier this year and intact.  Pulses present.  Venous disease likely creating the ulcer not arterial disease.  Lower limb ulcer, ankle, right, limited to breakdown of skin ([place]) Continue local wound care. Venous intervention as described below.   The patient has done their best to comply with conservative therapy of 20-30 mm Hg compression stockings, leg elevation, exercise, and anti-inflammatories as needed for discomfort.  Despite this, they continue to have daily and persistent symptoms from their venous disease. 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 No great saphenous vein or deep venous reflux was identified.  As such, the patient is likely to benefit from endovenous laser ablation of the right small saphenous vein.  Risks and  benefits of the procedure including bleeding, infection, recanalization, DVT, and need for further therapy for residual varicosities were discussed.  The patient voices their understanding and is agreeable to proceed with endovenous laser ablation of the right small saphenous vein.     Leotis Pain 09/12/2016, 3:17 PM

## 2016-09-12 NOTE — Assessment & Plan Note (Signed)
Continue local wound care. Venous intervention as described below.

## 2016-09-12 NOTE — Assessment & Plan Note (Signed)
On anticoagulation 

## 2016-09-12 NOTE — Assessment & Plan Note (Signed)
See plan below.  Needs EVLT of right SSV

## 2016-09-12 NOTE — Assessment & Plan Note (Signed)
Perfusion checked earlier this year and intact.  Pulses present.  Venous disease likely creating the ulcer not arterial disease.

## 2016-09-12 NOTE — Assessment & Plan Note (Addendum)
lipid control important in reducing the progression of atherosclerotic disease.   

## 2016-09-29 ENCOUNTER — Ambulatory Visit (INDEPENDENT_AMBULATORY_CARE_PROVIDER_SITE_OTHER): Payer: Medicare Other | Admitting: Vascular Surgery

## 2016-09-29 ENCOUNTER — Other Ambulatory Visit (INDEPENDENT_AMBULATORY_CARE_PROVIDER_SITE_OTHER): Payer: Self-pay | Admitting: Vascular Surgery

## 2016-09-29 ENCOUNTER — Ambulatory Visit (INDEPENDENT_AMBULATORY_CARE_PROVIDER_SITE_OTHER): Payer: Medicare Other

## 2016-09-29 ENCOUNTER — Encounter (INDEPENDENT_AMBULATORY_CARE_PROVIDER_SITE_OTHER): Payer: Medicare Other

## 2016-09-29 ENCOUNTER — Encounter (INDEPENDENT_AMBULATORY_CARE_PROVIDER_SITE_OTHER): Payer: Self-pay | Admitting: Vascular Surgery

## 2016-09-29 VITALS — BP 152/85 | HR 93 | Resp 16 | Ht 65.5 in | Wt 162.8 lb

## 2016-09-29 DIAGNOSIS — L97421 Non-pressure chronic ulcer of left heel and midfoot limited to breakdown of skin: Secondary | ICD-10-CM | POA: Diagnosis not present

## 2016-09-29 DIAGNOSIS — E785 Hyperlipidemia, unspecified: Secondary | ICD-10-CM | POA: Diagnosis not present

## 2016-09-29 DIAGNOSIS — L97309 Non-pressure chronic ulcer of unspecified ankle with unspecified severity: Secondary | ICD-10-CM

## 2016-09-29 DIAGNOSIS — M7989 Other specified soft tissue disorders: Secondary | ICD-10-CM

## 2016-09-29 DIAGNOSIS — I739 Peripheral vascular disease, unspecified: Secondary | ICD-10-CM | POA: Diagnosis not present

## 2016-09-29 DIAGNOSIS — L97311 Non-pressure chronic ulcer of right ankle limited to breakdown of skin: Secondary | ICD-10-CM | POA: Diagnosis not present

## 2016-09-29 NOTE — Progress Notes (Signed)
Subjective:    Patient ID: [name], female    DOB: [birth year], 82 y.o.   MRN: [id number] Chief Complaint  Patient presents with  . Follow-up    Patient presents for a six month lower extremity atherosclerotic disease follow up. The patient underwent an ABI which showed Right ABI: 0.81 and Left 1.12 (on 04/11/16, Right ABI: >1.3 and Left >1.3). A bilateral lower extremity arterial duplex is notable for RIGHT: Triphasic blood flow transitioning to monophasic just proximal to the knee at an area of stenosis / LEFT: Triphasic blood flow transitioning to biphasic just distally to the knee with a biphasic in the ATA and monophasic in the PTA. The patient states she continues to have the ulceration present at her lateral ankle for the past year. States minimal improvement. Patient is scheduled for a right lower extremity ablation of her SSV 10/20/16. She reports claudication when walking long distances in her RLE.    Review of Systems  Constitutional: Negative.   HENT: Negative.   Eyes: Negative.   Respiratory: Negative.   Cardiovascular:       Right Lower Extremity Claudication  Gastrointestinal: Negative.   Endocrine: Negative.   Genitourinary: Negative.   Musculoskeletal: Negative.   Skin: Positive for wound (Right Ankle).  Allergic/Immunologic: Negative.   Neurological: Negative.   Hematological: Negative.   Psychiatric/Behavioral: Negative.       Objective:   Physical Exam  Constitutional: She is oriented to person, place, and time. She appears well-developed and well-nourished.  HENT:  Head: Normocephalic and atraumatic.  Eyes: Conjunctivae and EOM are normal. Pupils are equal, round, and reactive to light.  Neck: Normal range of motion.  Cardiovascular: Normal rate, regular rhythm and normal heart sounds.   Pulses:      Radial pulses are 2+ on the right side, and 2+ on the left side.       Dorsalis pedis pulses are 0 on the right side, and 1+ on the left side.   Posterior tibial pulses are 0 on the right side, and 0 on the left side.  Pulmonary/Chest: Effort normal and breath sounds normal.  Abdominal: Soft. Bowel sounds are normal.  Musculoskeletal: Normal range of motion. She exhibits edema (Mild Right Lower Extremity).  Neurological: She is alert and oriented to person, place, and time.  Skin:  Right Lateral Ankle: Ulcer limited to breakdown of skin noted. No infection noted. No cellulitis noted.   Psychiatric: She has a normal mood and affect. Her behavior is normal. Judgment and thought content normal.   BP (!) 152/85   Pulse 93   Resp 16   Ht 5' 5.5" (1.664 m)   Wt 162 lb 12.8 oz (73.8 kg)   BMI 26.68 kg/m   Past Medical History:  Diagnosis Date  . Peripheral vascular disease ([place])   . Varicose veins    Social History   Social History  . Marital status: Married    Spouse name: N/A  . Number of children: N/A  . Years of education: N/A   Occupational History  . Not on file.   Social History Main Topics  . Smoking status: Never Smoker  . Smokeless tobacco: Not on file  . Alcohol use No  . Drug use: Unknown  . Sexual activity: Not on file   Other Topics Concern  . Not on file   Social History Narrative  . No narrative on file   Past Surgical History:  Procedure Laterality Date  . APPENDECTOMY    .  BASAL CELL CARCINOMA EXCISION Left   . BREAST CYST EXCISION Left   . CATARACT EXTRACTION Left   . CHOLECYSTECTOMY    . EYE SURGERY    . PACEMAKER PLACEMENT    . TONSILLECTOMY     Family History  Problem Relation Age of Onset  . Stroke Mother   . Cancer Father    Allergies  Allergen Reactions  . Metronidazole Nausea And Vomiting, Other (See Comments) and Rash  . Monistat  [Tioconazole] Rash and Other (See Comments)  . Sulfa Antibiotics Rash and Nausea And Vomiting      Assessment & Plan:  Patient presents for a six month lower extremity atherosclerotic disease follow up. The patient underwent an ABI which  showed Right ABI: 0.81 and Left 1.12 (on 04/11/16, Right ABI: >1.3 and Left >1.3). A bilateral lower extremity arterial duplex is notable for RIGHT: Triphasic blood flow transitioning to monophasic just proximal to the knee at an area of stenosis / LEFT: Triphasic blood flow transitioning to biphasic just distally to the knee with a biphasic in the ATA and monophasic in the PTA. The patient states she continues to have the ulceration present at her lateral ankle for the past year. States minimal improvement. Patient is scheduled for a right lower extremity ablation of her SSV 10/20/16. She reports claudication when walking long distances in her RLE.   1. PVD (peripheral vascular disease) ([place]) - Worsening RIght Lower Extremity with significant stenosis with monophasic blood flow to foot with non-healing lateral ankle ulceration with claudication symptoms - recommend right lower extremity angiogram in an effort to revascularize the extremity. Procedure, risks and benefits explained to patient. All questions answered. Patient wishes to proceed.   2. Hyperlipidemia, unspecified hyperlipidemia type - Stable On ASA for medical optimization. Encouraged good control as it slows the progression of atherosclerotic disease.   3. Lower limb ulcer, ankle, right, limited to breakdown of skin (HCC) - Stable Non-healing ulceration. Recommend RLE angiogram to revascularize the leg in an effort to assist in wound healing.   Current Outpatient Prescriptions on File Prior to Visit  Medication Sig Dispense Refill  . Acetaminophen (TYLENOL 8 HOUR PO) Take by mouth daily.    Marland Kitchen aspirin 81 MG tablet Take 81 mg by mouth daily.    Marland Kitchen BIOTIN PO Take by mouth daily.    . Calcium Carbonate-Vitamin D (CALCIUM + D PO) Take by mouth daily.    Marland Kitchen CARTIA XT 300 MG 24 hr capsule     . Cyanocobalamin (VITAMIN B 12 PO) Take by mouth daily.    . dabigatran (PRADAXA) 150 MG CAPS capsule Take 150 mg by mouth 2 (two) times daily.    Marland Kitchen  diltiazem (DILACOR XR) 240 MG 24 hr capsule Take 240 mg by mouth daily.    . fluticasone (VERAMYST) 27.5 MCG/SPRAY nasal spray Place 2 sprays into the nose daily.    . furosemide (LASIX) 40 MG tablet Take 40 mg by mouth daily.    . Glucosamine HCl (GLUCOSAMINE PO) Take by mouth daily.    Marland Kitchen levothyroxine (SYNTHROID, LEVOTHROID) 50 MCG tablet     . losartan (COZAAR) 50 MG tablet Take 50 mg by mouth daily.    Marland Kitchen MAGNESIUM PO Take by mouth 2 (two) times daily.    . metoprolol succinate (TOPROL-XL) 25 MG 24 hr tablet Take 25 mg by mouth daily.    . Multiple Vitamins-Minerals (VISION-VITE PRESERVE PO) Take by mouth daily.    . mupirocin ointment (BACTROBAN) 2 %  Apply to wound twice a day. 30 g 2  . omeprazole (PRILOSEC) 20 MG capsule     . omeprazole (PRILOSEC) 40 MG capsule Take 40 mg by mouth daily.    . Potassium Chloride (KLOR-CON PO) Take by mouth daily.    . Red Yeast Rice Extract (RED YEAST RICE PO) Take by mouth daily.    Marland Kitchen tolterodine (DETROL) 1 MG tablet Take 1 mg by mouth 2 (two) times daily.    . TRAMADOL HCL PO Take by mouth daily.    Marland Kitchen VITAMIN A PO Take by mouth daily.     No current facility-administered medications on file prior to visit.     There are no Patient Instructions on file for this visit. No Follow-up on file.   Keirra Zeimet A Audryanna Zurita, PA-C

## 2016-10-02 ENCOUNTER — Ambulatory Visit (INDEPENDENT_AMBULATORY_CARE_PROVIDER_SITE_OTHER): Payer: Medicare Other | Admitting: Podiatry

## 2016-10-02 ENCOUNTER — Telehealth (INDEPENDENT_AMBULATORY_CARE_PROVIDER_SITE_OTHER): Payer: Self-pay

## 2016-10-02 DIAGNOSIS — Q828 Other specified congenital malformations of skin: Secondary | ICD-10-CM | POA: Diagnosis not present

## 2016-10-02 DIAGNOSIS — B351 Tinea unguium: Secondary | ICD-10-CM | POA: Diagnosis not present

## 2016-10-02 DIAGNOSIS — M79676 Pain in unspecified toe(s): Secondary | ICD-10-CM

## 2016-10-02 NOTE — Progress Notes (Signed)
She presents today to complaint of painful elongated toenails and calluses bilateral.  Objective: Vital signs are stable she is alert and oriented 3 pulses remain nonpalpable she will be visiting vascular next week for surgery. Chronic wound lateral ankle right. No signs of cellulitis. Toenails are thick yellow dystrophic onychomycotic. Multiple porokeratosis plantar aspect bilateral foot.  Assessment: Pain in limb secondary to onychomycosis and hyperkeratosis and peripheral last her disease.  Plan Debridement all reactive hyperkeratosis debrided toenails 1 through 5 bilateral no iatrogenic lesions.

## 2016-10-02 NOTE — Telephone Encounter (Signed)
Contacted the patient regarding getting her scheduled for a RLE angio, per the patient she wants to do her laser then talk with Dr. Lucky Cowboy regarding having an angio because she is hoping that the laser will take care of any issues.

## 2016-10-03 ENCOUNTER — Other Ambulatory Visit (INDEPENDENT_AMBULATORY_CARE_PROVIDER_SITE_OTHER): Payer: Self-pay | Admitting: Vascular Surgery

## 2016-10-06 ENCOUNTER — Other Ambulatory Visit
Admission: RE | Admit: 2016-10-06 | Discharge: 2016-10-06 | Disposition: A | Discharge status: Home / Self Care | Payer: Medicare Other | Source: Ambulatory Visit | Attending: Vascular Surgery | Admitting: Vascular Surgery

## 2016-10-06 ENCOUNTER — Telehealth (INDEPENDENT_AMBULATORY_CARE_PROVIDER_SITE_OTHER): Payer: Self-pay

## 2016-10-06 DIAGNOSIS — I739 Peripheral vascular disease, unspecified: Secondary | ICD-10-CM | POA: Insufficient documentation

## 2016-10-06 LAB — CREATININE, SERUM
Creatinine, Ser: 1.14 mg/dL — ABNORMAL HIGH (ref 0.44–1.00)
GFR calc Af Amer: 47 mL/min — ABNORMAL LOW (ref >60)
GFR, EST NON AFRICAN AMERICAN: 41 mL/min — AB (ref >60)

## 2016-10-06 LAB — BUN: BUN: 21 mg/dL — AB (ref 6–20)

## 2016-10-06 NOTE — Telephone Encounter (Signed)
Patient called stating that she went to have her labs drawn at the hospital and they gave her some paperwork and let her know she was registered, but she was wondering if she needed to have labs drawn. I let her know she did.

## 2016-10-08 MED ORDER — DEXTROSE 5 % IV SOLN: 1.5000 g | INTRAVENOUS | Status: DC

## 2016-10-09 ENCOUNTER — Ambulatory Visit
Admission: RE | Admit: 2016-10-09 | Discharge: 2016-10-09 | Disposition: A | Discharge status: Home / Self Care | Payer: Medicare Other | Source: Ambulatory Visit | Attending: Vascular Surgery | Admitting: Vascular Surgery

## 2016-10-09 ENCOUNTER — Encounter: Payer: Self-pay | Admitting: *Deleted

## 2016-10-09 ENCOUNTER — Encounter
Admission: RE | Disposition: A | Discharge status: Home / Self Care | Payer: Self-pay | Source: Ambulatory Visit | Attending: Vascular Surgery

## 2016-10-09 DIAGNOSIS — Z9049 Acquired absence of other specified parts of digestive tract: Secondary | ICD-10-CM | POA: Insufficient documentation

## 2016-10-09 DIAGNOSIS — Z888 Allergy status to other drugs, medicaments and biological substances status: Secondary | ICD-10-CM | POA: Insufficient documentation

## 2016-10-09 DIAGNOSIS — Z8249 Family history of ischemic heart disease and other diseases of the circulatory system: Secondary | ICD-10-CM | POA: Diagnosis not present

## 2016-10-09 DIAGNOSIS — I8390 Asymptomatic varicose veins of unspecified lower extremity: Secondary | ICD-10-CM | POA: Insufficient documentation

## 2016-10-09 DIAGNOSIS — Z9842 Cataract extraction status, left eye: Secondary | ICD-10-CM | POA: Insufficient documentation

## 2016-10-09 DIAGNOSIS — Z7982 Long term (current) use of aspirin: Secondary | ICD-10-CM | POA: Diagnosis not present

## 2016-10-09 DIAGNOSIS — L97311 Non-pressure chronic ulcer of right ankle limited to breakdown of skin: Secondary | ICD-10-CM | POA: Diagnosis not present

## 2016-10-09 DIAGNOSIS — E785 Hyperlipidemia, unspecified: Secondary | ICD-10-CM | POA: Insufficient documentation

## 2016-10-09 DIAGNOSIS — I70238 Atherosclerosis of native arteries of right leg with ulceration of other part of lower right leg: Secondary | ICD-10-CM | POA: Diagnosis not present

## 2016-10-09 DIAGNOSIS — Z882 Allergy status to sulfonamides status: Secondary | ICD-10-CM | POA: Diagnosis not present

## 2016-10-09 DIAGNOSIS — Z950 Presence of cardiac pacemaker: Secondary | ICD-10-CM | POA: Diagnosis not present

## 2016-10-09 DIAGNOSIS — I7025 Atherosclerosis of native arteries of other extremities with ulceration: Secondary | ICD-10-CM | POA: Diagnosis not present

## 2016-10-09 HISTORY — PX: PERIPHERAL VASCULAR CATHETERIZATION: SHX172C

## 2016-10-09 SURGERY — LOWER EXTREMITY ANGIOGRAPHY
Anesthesia: Moderate Sedation | Site: Leg Lower | Laterality: Right

## 2016-10-09 MED ORDER — MIDAZOLAM HCL 5 MG/5ML IJ SOLN
INTRAMUSCULAR | Status: AC
  Filled 2016-10-09: qty 5

## 2016-10-09 MED ORDER — PHENOL 1.4 % MT LIQD
1.0000 | OROMUCOSAL | Status: DC | PRN
  Filled 2016-10-09: qty 177

## 2016-10-09 MED ORDER — ONDANSETRON HCL 4 MG/2ML IJ SOLN: 4.0000 mg | Freq: Four times a day (QID) | INTRAMUSCULAR | Status: DC | PRN

## 2016-10-09 MED ORDER — SODIUM CHLORIDE 0.9 % IV SOLN: INTRAVENOUS | Status: DC

## 2016-10-09 MED ORDER — OXYCODONE-ACETAMINOPHEN 5-325 MG PO TABS: 1.0000 | ORAL_TABLET | ORAL | Status: DC | PRN

## 2016-10-09 MED ORDER — HEPARIN SODIUM (PORCINE) 1000 UNIT/ML IJ SOLN
INTRAMUSCULAR | Status: DC | PRN
  Administered 2016-10-09: 4000 [IU] via INTRAVENOUS

## 2016-10-09 MED ORDER — FENTANYL CITRATE (PF) 100 MCG/2ML IJ SOLN
INTRAMUSCULAR | Status: AC
  Filled 2016-10-09: qty 2

## 2016-10-09 MED ORDER — IOPAMIDOL (ISOVUE-300) INJECTION 61%
INTRAVENOUS | Status: DC | PRN
  Administered 2016-10-09: 65 mL via INTRAVENOUS

## 2016-10-09 MED ORDER — METOPROLOL TARTRATE 5 MG/5ML IV SOLN
2.0000 mg | INTRAVENOUS | Status: DC | PRN
Start: 2016-10-09 — End: 2016-10-09

## 2016-10-09 MED ORDER — HEPARIN SODIUM (PORCINE) 1000 UNIT/ML IJ SOLN
INTRAMUSCULAR | Status: AC
  Filled 2016-10-09: qty 1

## 2016-10-09 MED ORDER — MIDAZOLAM HCL 2 MG/2ML IJ SOLN
INTRAMUSCULAR | Status: DC | PRN
  Administered 2016-10-09: 1 mg via INTRAVENOUS

## 2016-10-09 MED ORDER — FAMOTIDINE 20 MG PO TABS: 40.0000 mg | ORAL_TABLET | ORAL | Status: DC | PRN

## 2016-10-09 MED ORDER — DIPHENHYDRAMINE HCL 50 MG/ML IJ SOLN
INTRAMUSCULAR | Status: AC
  Filled 2016-10-09: qty 1

## 2016-10-09 MED ORDER — ACETAMINOPHEN 325 MG PO TABS: 325.0000 mg | ORAL_TABLET | ORAL | Status: DC | PRN

## 2016-10-09 MED ORDER — SODIUM CHLORIDE 0.9 % IV SOLN: 500.0000 mL | Freq: Once | INTRAVENOUS | Status: DC | PRN

## 2016-10-09 MED ORDER — FENTANYL CITRATE (PF) 100 MCG/2ML IJ SOLN
INTRAMUSCULAR | Status: DC | PRN
  Administered 2016-10-09: 25 ug via INTRAVENOUS

## 2016-10-09 MED ORDER — LIDOCAINE-EPINEPHRINE (PF) 1 %-1:200000 IJ SOLN
INTRAMUSCULAR | Status: AC
  Filled 2016-10-09: qty 30

## 2016-10-09 MED ORDER — ACETAMINOPHEN 325 MG RE SUPP
325.0000 mg | RECTAL | Status: DC | PRN
  Filled 2016-10-09: qty 2

## 2016-10-09 MED ORDER — LABETALOL HCL 5 MG/ML IV SOLN: 10.0000 mg | INTRAVENOUS | Status: DC | PRN

## 2016-10-09 MED ORDER — METHYLPREDNISOLONE SODIUM SUCC 125 MG IJ SOLR
125.0000 mg | INTRAMUSCULAR | Status: DC | PRN
Start: 2016-10-09 — End: 2016-10-09

## 2016-10-09 MED ORDER — HYDRALAZINE HCL 20 MG/ML IJ SOLN: 5.0000 mg | INTRAMUSCULAR | Status: DC | PRN

## 2016-10-09 MED ORDER — HEPARIN (PORCINE) IN NACL 2-0.9 UNIT/ML-% IJ SOLN
INTRAMUSCULAR | Status: AC
  Filled 2016-10-09: qty 1000

## 2016-10-09 MED ORDER — DIPHENHYDRAMINE HCL 50 MG/ML IJ SOLN
INTRAMUSCULAR | Status: DC | PRN
  Administered 2016-10-09: 25 mg via INTRAVENOUS

## 2016-10-09 MED ORDER — HYDROMORPHONE HCL 1 MG/ML IJ SOLN: 1.0000 mg | Freq: Once | INTRAMUSCULAR | Status: DC

## 2016-10-09 MED ORDER — GUAIFENESIN-DM 100-10 MG/5ML PO SYRP: 15.0000 mL | ORAL_SOLUTION | ORAL | Status: DC | PRN

## 2016-10-09 MED ORDER — HYDROMORPHONE HCL 1 MG/ML IJ SOLN: 0.5000 mg | INTRAMUSCULAR | Status: DC | PRN

## 2016-10-09 SURGICAL SUPPLY — 19 items
BALLN LUTONIX 5X150X130 (BALLOONS) ×4
BALLN ULTRVRSE 3X100X150 (BALLOONS) ×4
BALLOON LUTONIX 5X150X130 (BALLOONS) ×2 IMPLANT
BALLOON ULTRVRSE 3X100X150 (BALLOONS) ×2 IMPLANT
CATH PIG 70CM (CATHETERS) ×4 IMPLANT
CATH RIM 65CM (CATHETERS) ×4 IMPLANT
CATH VERT 100CM (CATHETERS) ×4 IMPLANT
DEVICE PRESTO INFLATION (MISCELLANEOUS) ×4 IMPLANT
DEVICE STARCLOSE SE CLOSURE (Vascular Products) ×4 IMPLANT
DEVICE TORQUE (MISCELLANEOUS) ×4 IMPLANT
GLIDEWIRE ADV .035X260CM (WIRE) ×4 IMPLANT
LIFESTENT 6X150X130 (Permanent Stent) ×4 IMPLANT
PACK ANGIOGRAPHY (CUSTOM PROCEDURE TRAY) ×4 IMPLANT
SHEATH BRITE TIP 5FRX11 (SHEATH) ×4 IMPLANT
SHEATH HIGHFLEX ANSEL 6FRX55 (SHEATH) ×4 IMPLANT
SYR MEDRAD MARK V 150ML (SYRINGE) ×4 IMPLANT
TUBING CONTRAST HIGH PRESS 72 (TUBING) ×4 IMPLANT
WIRE G V18X300CM (WIRE) ×4 IMPLANT
WIRE J 3MM .035X145CM (WIRE) ×4 IMPLANT

## 2016-10-09 NOTE — Op Note (Signed)
[facility] Percutaneous Study/Intervention Procedural Note   Date of Surgery: 10/09/2016  Surgeon(s):Joshia Kitchings   Assistants:none  Pre-operative Diagnosis: PAD with ulceration right lower extremity  Post-operative diagnosis: Same  Procedure(s) Performed: 1. Ultrasound guidance for vascular access left femoral artery 2. Catheter placement into right posterior tibial artery from left femoral approach 3. Aortogram and selective right lower extremity angiogram 4. Percutaneous transluminal angioplasty of right distal SFA and above-knee popliteal artery with 5 mm diameter x 15 cm length Lutonix drug-coated balloon 5. Percutaneous transluminal angioplasty of the right posterior tibial artery with 3 mm diameter by 10 cm length angioplasty balloon  6.  Self-retaining stent placement to the distal SFA and above-knee popliteal artery for greater than 50% stenosis after angioplasty using a 6 mm diameter by 15 cm length life stent 7. StarClose closure device left femoral artery  EBL: 10  Contrast: 65 cc  Fluoro Time: 5.9 minutes  Moderate Conscious Sedation Time: approximately 40 minutes using 1 mg of Versed and 25 mcg of Fentanyl  Indications: Patient is a 82 y.o.female with a nonhealing ulcer on the right foot. The patient has noninvasive study showing monophasic flow below the SFA on the right leg poor digital pressures. The patient is brought in for angiography for further evaluation and potential treatment. Risks and benefits are discussed and informed consent is obtained  Procedure: The patient was identified and appropriate procedural time out was performed. The patient was then placed supine on the table and prepped and draped in the usual sterile fashion.Moderate conscious sedation was administered during a face to face encounter with the patient throughout the  procedure with my supervision of the RN administering medicines and monitoring the patient's vital signs, pulse oximetry, telemetry and mental status throughout from the start of the procedure until the patient was taken to the recovery room. Ultrasound was used to evaluate the left common femoral artery. It was patent . A digital ultrasound image was acquired. A Seldinger needle was used to access the left common femoral artery under direct ultrasound guidance and a permanent image was performed. A 0.035 J wire was advanced without resistance and a 5Fr sheath was placed. Pigtail catheter was placed into the aorta and an AP aortogram was performed. This demonstrated normal left renal artery and at least moderate (>60%) right renal artery stenosis and normal aorta and iliac segments without significant stenosis. I then crossed the aortic bifurcation and advanced to the right femoral head. Selective right lower extremity angiogram was then performed. This demonstrated Normal common femoral artery and proximal superficial femoral artery. The distal SFA had a calcific lesion with a short segment occlusion with reconstitution of the above-knee popliteal artery. The patient then had a normal tibioperoneal trunk with a peroneal artery that had its normal course to the ankle and a posterior tibial artery with a near occlusive stenosis in the mid to distal segment but otherwise continuous to the foot. The patient was systemically heparinized and a 6 [place] [employer] sheath was then placed over the [employer] wire. I then used a Kumpe catheter and the advantage wire to navigate through the SFA and popliteal occlusion and confirm intraluminal flow in the below-knee popliteal artery. I then advanced down into the posterior tibial artery and exchanged for a 0.018 wire and crossed the stenosis without difficulty. I then proceeded with treatment. The posterior tibial artery was treated with a 3 mm diameter by 10 cm length  angioplasty balloon. The inflation was from 6-8 atm for  1 minute. I then turned my attention to the above-knee popliteal artery and distal SFA. A 5 mm diameter by 15 cm length Lutonix drug-coated angioplasty balloon was then inflated to 12 atm for 1 minute. Completion angiogram showed about a 70-80% residual stenosis in the popliteal and distal SFA and so I elected to cover this with a stent. A 6 mm diameter by 15 cm length life stent was deployed in the distal SFA and above-knee popliteal artery and postdilated with a 5 mm balloon with excellent angiographic completion result and less than 10% residual stenosis. The posterior tibial artery had about a 20% residual stenosis but was now continuous to the foot with brisk flow. At this point, her perfusion was markedly improved and she had two-vessel runoff distally. I elected to terminate the procedure. The sheath was removed and StarClose closure device was deployed in the left femoral artery with excellent hemostatic result. The patient was taken to the recovery room in stable condition having tolerated the procedure well.  Findings:  Aortogram: This demonstrated normal left renal artery and at least moderate (>60%) right renal artery stenosis and normal aorta and iliac segments without significant stenosis. Right Lower Extremity: Normal common femoral artery and proximal superficial femoral artery. The distal SFA had a calcific lesion with a short segment occlusion with reconstitution of the above-knee popliteal artery. The patient then had a normal tibioperoneal trunk with a peroneal artery that had its normal course to the ankle and a posterior tibial artery with a near occlusive stenosis in the mid to distal segment but otherwise continuous to the foot.    Disposition: Patient was taken to the recovery room in stable condition having tolerated the procedure well.  Complications: None  Leotis Pain 10/09/2016 10:23 AM   This  note was created with Dragon Medical transcription system. Any errors in dictation are purely unintentional.

## 2016-10-09 NOTE — Discharge Instructions (Signed)
Angiogram, Care After °Refer to this sheet in the next few weeks. These instructions provide you with information about caring for yourself after your procedure. Your health care provider may also give you more specific instructions. Your treatment has been planned according to current medical practices, but problems sometimes occur. Call your health care provider if you have any problems or questions after your procedure. °WHAT TO EXPECT AFTER THE PROCEDURE °After your procedure, it is typical to have the following: °· Bruising at the catheter insertion site that usually fades within 1-2 weeks. °· Blood collecting in the tissue (hematoma) that may be painful to the touch. It should usually decrease in size and tenderness within 1-2 weeks. °HOME CARE INSTRUCTIONS °· Take medicines only as directed by your health care provider. °· You may shower 24-48 hours after the procedure or as directed by your health care provider. Remove the bandage (dressing) and gently wash the site with plain soap and water. Pat the area dry with a clean towel. Do not rub the site, because this may cause bleeding. °· Do not take baths, swim, or use a hot tub until your health care provider approves. °· Check your insertion site every day for redness, swelling, or drainage. °· Do not apply powder or lotion to the site. °· Do not lift over 10 lb (4.5 kg) for 5 days after your procedure or as directed by your health care provider. °· Ask your health care provider when it is okay to: °¨ Return to work or school. °¨ Resume usual physical activities or sports. °¨ Resume sexual activity. °· Do not drive home if you are discharged the same day as the procedure. Have someone else drive you. °· You may drive 24 hours after the procedure unless otherwise instructed by your health care provider. °· Do not operate machinery or power tools for 24 hours after the procedure or as directed by your health care provider. °· If your procedure was done as an  outpatient procedure, which means that you went home the same day as your procedure, a responsible adult should be with you for the first 24 hours after you arrive home. °· Keep all follow-up visits as directed by your health care provider. This is important. °SEEK MEDICAL CARE IF: °· You have a fever. °· You have chills. °· You have increased bleeding from the catheter insertion site. Hold pressure on the site. °SEEK IMMEDIATE MEDICAL CARE IF: °· You have unusual pain at the catheter insertion site. °· You have redness, warmth, or swelling at the catheter insertion site. °· You have drainage (other than a small amount of blood on the dressing) from the catheter insertion site. °· The catheter insertion site is bleeding, and the bleeding does not stop after 30 minutes of holding steady pressure on the site. °· The area near or just beyond the catheter insertion site becomes pale, cool, tingly, or numb. °  °This information is not intended to replace advice given to you by your health care provider. Make sure you discuss any questions you have with your health care provider. °  °Document Released: 06/08/2005 Document Revised: 12/11/2014 Document Reviewed: 04/23/2013 °Elsevier Interactive Patient Education ©2016 [employer] ° °

## 2016-10-09 NOTE — Progress Notes (Signed)
Report given to Isa Rankin, RN.

## 2016-10-09 NOTE — H&P (Signed)
[facility] History & Physical Update  The patient was interviewed and re-examined.  The patient's previous History and Physical has been reviewed and is unchanged.  There is no change in the plan of care. We plan to proceed with the scheduled procedure.  Leotis Pain, MD  10/09/2016, 9:09 AM

## 2016-10-10 ENCOUNTER — Encounter: Payer: Self-pay | Admitting: Vascular Surgery

## 2016-10-12 ENCOUNTER — Telehealth (INDEPENDENT_AMBULATORY_CARE_PROVIDER_SITE_OTHER): Payer: Self-pay

## 2016-10-12 NOTE — Telephone Encounter (Signed)
Patient called stating she is having some swelling from mid-calf to ankle since a day after her angio procedure on Monday and wants to know is this normal and is there something else she could do.

## 2016-10-12 NOTE — Telephone Encounter (Signed)
Patient has chronic right lower extremity PAD with monophasic blood flow to feet with angioplasty and stent placement. I expect her to have reprofusion syndrome which can last 4-6 weeks. She should wear her compression stockings and elevate her legs as much as possible.

## 2016-10-12 NOTE — Telephone Encounter (Signed)
I called the patient and explained what [name] the P.A.  Said regarding her angio and what to do about it. See previous note.

## 2016-10-20 ENCOUNTER — Ambulatory Visit (INDEPENDENT_AMBULATORY_CARE_PROVIDER_SITE_OTHER): Payer: Medicare Other | Admitting: Vascular Surgery

## 2016-10-20 ENCOUNTER — Encounter (INDEPENDENT_AMBULATORY_CARE_PROVIDER_SITE_OTHER): Payer: Self-pay | Admitting: Vascular Surgery

## 2016-10-20 ENCOUNTER — Other Ambulatory Visit (INDEPENDENT_AMBULATORY_CARE_PROVIDER_SITE_OTHER): Payer: Self-pay | Admitting: Vascular Surgery

## 2016-10-20 VITALS — BP 120/79 | HR 87 | Resp 16

## 2016-10-20 DIAGNOSIS — I83019 Varicose veins of right lower extremity with ulcer of unspecified site: Secondary | ICD-10-CM

## 2016-10-20 DIAGNOSIS — I8310 Varicose veins of unspecified lower extremity with inflammation: Secondary | ICD-10-CM

## 2016-10-20 DIAGNOSIS — I83013 Varicose veins of right lower extremity with ulcer of ankle: Secondary | ICD-10-CM | POA: Diagnosis not present

## 2016-10-20 DIAGNOSIS — L97311 Non-pressure chronic ulcer of right ankle limited to breakdown of skin: Secondary | ICD-10-CM

## 2016-10-20 DIAGNOSIS — L97919 Non-pressure chronic ulcer of unspecified part of right lower leg with unspecified severity: Principal | ICD-10-CM

## 2016-10-20 NOTE — Progress Notes (Signed)
The patient's right lower extremity was sterilely prepped and draped. The ultrasound machine was used to visualize the lesser saphenous vein throughout its course. A segment in the lower calf was selected for access. The lesser saphenous vein was accessed without difficulty using ultrasound guidance with a micro puncture needle. A 0.018 wire was placed beyond the small saphenous-popliteal junction. The 65cm sheath was placed over the wire and the wire and dilator were removed. The laser fiber was placed through the sheath and its tip was placed approximately 4 cm below the small saphenous-popliteal junction. Tumescent anesthesia was then created with a dilute lidocaine solution. Laser energy was then delivered with constant withdrawal of the sheath and laser fiber. Approximately 860 Joules of energy were delivered over a length of 21 cm using the 1470 Hz Venocare machine at [employer]. Sterile dressings were placed. The patient tolerated the procedure well without complications.

## 2016-10-20 NOTE — Assessment & Plan Note (Signed)
S/p arterial revascularization Laser procedure as below.

## 2016-10-20 NOTE — Assessment & Plan Note (Signed)
See laser procedure

## 2016-10-24 ENCOUNTER — Ambulatory Visit (INDEPENDENT_AMBULATORY_CARE_PROVIDER_SITE_OTHER): Payer: Medicare Other

## 2016-10-24 DIAGNOSIS — I8310 Varicose veins of unspecified lower extremity with inflammation: Secondary | ICD-10-CM

## 2016-11-01 ENCOUNTER — Other Ambulatory Visit (INDEPENDENT_AMBULATORY_CARE_PROVIDER_SITE_OTHER): Payer: Self-pay | Admitting: Vascular Surgery

## 2016-11-01 DIAGNOSIS — I739 Peripheral vascular disease, unspecified: Secondary | ICD-10-CM

## 2016-11-08 ENCOUNTER — Ambulatory Visit (INDEPENDENT_AMBULATORY_CARE_PROVIDER_SITE_OTHER): Payer: Medicare Other

## 2016-11-08 ENCOUNTER — Encounter (INDEPENDENT_AMBULATORY_CARE_PROVIDER_SITE_OTHER): Payer: Self-pay | Admitting: Vascular Surgery

## 2016-11-08 ENCOUNTER — Ambulatory Visit (INDEPENDENT_AMBULATORY_CARE_PROVIDER_SITE_OTHER): Payer: Medicare Other | Admitting: Vascular Surgery

## 2016-11-08 VITALS — BP 127/76 | HR 83 | Resp 16 | Ht 65.0 in | Wt 160.0 lb

## 2016-11-08 DIAGNOSIS — I739 Peripheral vascular disease, unspecified: Secondary | ICD-10-CM | POA: Diagnosis not present

## 2016-11-08 DIAGNOSIS — L97919 Non-pressure chronic ulcer of unspecified part of right lower leg with unspecified severity: Secondary | ICD-10-CM

## 2016-11-08 DIAGNOSIS — L97311 Non-pressure chronic ulcer of right ankle limited to breakdown of skin: Secondary | ICD-10-CM

## 2016-11-08 DIAGNOSIS — I83019 Varicose veins of right lower extremity with ulcer of unspecified site: Secondary | ICD-10-CM | POA: Diagnosis not present

## 2016-11-08 NOTE — Progress Notes (Signed)
Subjective:    Patient ID: [name], female    DOB: [birth year], 82 y.o.   MRN: [id number] Chief Complaint  Patient presents with  . Re-evaluation    Post Laser/ABI   Patient presents for her first post-procedure and laser ablation follow up. She is s/p RLE angiogram with angioplasty and stent placement on 10/09/16. She is without complaint. Her post-procedure course has been unremarkable. States her leg "feels" better and she sees improvement in her right ankle ulceration. The patient underwent an ABI which showed Right ABI: >1.3 and Left >1.3, waveform are decreased bilaterally (on 09/29/16, Right ABI: 0.81 and Left 1.12). On 10/24/16, the patient underwent a successful right SSV ablation. The patient has noticed a decrease in her RLE swelling. Overall, she is without complaint and states improvement in her RLE.    Review of Systems  Constitutional: Negative.   HENT: Negative.   Eyes: Negative.   Respiratory: Negative.   Cardiovascular: Negative.   Gastrointestinal: Negative.   Endocrine: Negative.   Genitourinary: Negative.   Musculoskeletal: Negative.   Skin: Positive for wound (Right Lateral Ankle).  Allergic/Immunologic: Negative.   Neurological: Negative.   Hematological: Negative.   Psychiatric/Behavioral: Negative.       Objective:   Physical Exam  Constitutional: She is oriented to person, place, and time. She appears well-developed and well-nourished.  HENT:  Head: Normocephalic and atraumatic.  Right Ear: External ear normal.  Left Ear: External ear normal.  Eyes: Conjunctivae and EOM are normal. Pupils are equal, round, and reactive to light.  Neck: Normal range of motion.  Cardiovascular: Normal rate, regular rhythm and normal heart sounds.   Pulses:      Radial pulses are 2+ on the right side, and 2+ on the left side.       Dorsalis pedis pulses are 2+ on the right side, and 2+ on the left side.       Posterior tibial pulses are 2+ on the right side,  and 2+ on the left side.  Pulmonary/Chest: Effort normal and breath sounds normal.  Abdominal: Soft. Bowel sounds are normal.  Musculoskeletal: Normal range of motion. She exhibits no edema.  Neurological: She is alert and oriented to person, place, and time.  Skin:  Small ulceration noted on lateral ankle. Scab covering. No infection or cellulitis.  Psychiatric: She has a normal mood and affect. Her behavior is normal. Judgment and thought content normal.   BP 127/76 (BP Location: Right Arm)   Pulse 83   Resp 16   Ht 5\' 5"  (1.651 m)   Wt 160 lb (72.6 kg)   BMI 26.63 kg/m   Past Medical History:  Diagnosis Date  . Peripheral vascular disease ([place])   . Varicose veins    Social History   Social History  . Marital status: Married    Spouse name: N/A  . Number of children: N/A  . Years of education: N/A   Occupational History  . Not on file.   Social History Main Topics  . Smoking status: Never Smoker  . Smokeless tobacco: Not on file  . Alcohol use No  . Drug use: No  . Sexual activity: Not on file   Other Topics Concern  . Not on file   Social History Narrative  . No narrative on file   Past Surgical History:  Procedure Laterality Date  . APPENDECTOMY    . BASAL CELL CARCINOMA EXCISION Left   . BREAST CYST EXCISION Left   .  CATARACT EXTRACTION Left   . CHOLECYSTECTOMY    . EYE SURGERY    . PACEMAKER PLACEMENT    . PERIPHERAL VASCULAR CATHETERIZATION Right 10/09/2016   Procedure: Lower Extremity Angiography;  Surgeon: Algernon Huxley, MD;  Location: [facility];  Service: Cardiovascular;  Laterality: Right;  . PERIPHERAL VASCULAR CATHETERIZATION  10/09/2016   Procedure: Lower Extremity Intervention;  Surgeon: Algernon Huxley, MD;  Location: [facility];  Service: Cardiovascular;;  . TONSILLECTOMY     Family History  Problem Relation Age of Onset  . Stroke Mother   . Cancer Father    Allergies  Allergen Reactions  . Metronidazole Nausea And  Vomiting, Other (See Comments) and Rash  . Monistat  [Tioconazole] Rash and Other (See Comments)  . Sulfa Antibiotics Rash and Nausea And Vomiting      Assessment & Plan:  Patient presents for her first post-procedure and laser ablation follow up. She is s/p RLE angiogram with angioplasty and stent placement on 10/09/16. She is without complaint. Her post-procedure course has been unremarkable. States her leg "feels" better and she sees improvement in her right ankle ulceration. The patient underwent an ABI which showed Right ABI: >1.3 and Left >1.3, waveform are decreased bilaterally (on 09/29/16, Right ABI: 0.81 and Left 1.12). On 10/24/16, the patient underwent a successful right SSV ablation. The patient has noticed a decrease in her RLE swelling. Overall, she is without complaint and states improvement in her RLE.   1. PVD (peripheral vascular disease) with claudication (HCC) - Improvement Improvement in symptoms and ABI s/p RLE angiogam.  Follow up in three month with ABI and RLE arterial duplex.  2. Venous Insufficiency - Improvement s/p SSV ablation on 10/24/16. Continue daily compression and elevation. No interest in sclerotherapy at this time.   3. Lower limb ulcer, ankle, right, limited to breakdown of skin ([place]) - Improvement Continue local wound care. Patient to call if wound worsens.   Current Outpatient Prescriptions on File Prior to Visit  Medication Sig Dispense Refill  . acetaminophen (TYLENOL) 650 MG CR tablet Take 650 mg by mouth every 8 (eight) hours as needed for pain.    Marland Kitchen aspirin EC 81 MG tablet Take 81 mg by mouth daily.    . Biotin 1000 MCG tablet Take 1,000 mcg by mouth daily.    . calcium carbonate (OSCAL) 1500 (600 Ca) MG TABS tablet Take 600 mg of elemental calcium by mouth daily.    Marland Kitchen CARTIA XT 300 MG 24 hr capsule Take 300 mg by mouth daily.     . Cholecalciferol (VITAMIN D) 2000 units CAPS Take 2,000 Units by mouth daily.    . dabigatran (PRADAXA) 150 MG  CAPS capsule Take 150 mg by mouth 2 (two) times daily.    . furosemide (LASIX) 40 MG tablet Take 40 mg by mouth daily.    . Glucosamine 500 MG CAPS Take 500 mg by mouth daily.    Marland Kitchen levothyroxine (SYNTHROID, LEVOTHROID) 50 MCG tablet Take 50 mcg by mouth daily before breakfast.     . losartan (COZAAR) 50 MG tablet Take 50 mg by mouth daily.    . magnesium oxide (MAG-OX) 400 MG tablet Take 400 mg by mouth daily.    . metoprolol (LOPRESSOR) 50 MG tablet Take 50 mg by mouth 2 (two) times daily.    . Multiple Vitamin (MULTIVITAMIN WITH MINERALS) TABS tablet Take 1 tablet by mouth daily.    . Multiple Vitamins-Minerals (PRESERVISION/LUTEIN PO) Take 1  capsule by mouth daily.    . mupirocin ointment (BACTROBAN) 2 % Apply to wound twice a day. (Patient taking differently: Apply 1 application topically 2 (two) times daily as needed (for wound therapy). ) 30 g 2  . omeprazole (PRILOSEC) 20 MG capsule Take by mouth daily.     . potassium chloride SA (K-DUR,KLOR-CON) 20 MEQ tablet Take 20 mEq by mouth daily.    . rosuvastatin (CRESTOR) 5 MG tablet Take 5 mg by mouth every Monday, Wednesday, and Friday.    . tolterodine (DETROL) 2 MG tablet Take 2 mg by mouth daily.    . traMADol (ULTRAM) 50 MG tablet Take 50 mg by mouth every 6 (six) hours as needed for pain.    . vitamin B-12 (CYANOCOBALAMIN) 1000 MCG tablet Take 1,000 mcg by mouth daily.     No current facility-administered medications on file prior to visit.     There are no Patient Instructions on file for this visit. No Follow-up on file.   Zavon Hyson A Merisa Julio, PA-C

## 2016-11-20 ENCOUNTER — Telehealth (INDEPENDENT_AMBULATORY_CARE_PROVIDER_SITE_OTHER): Payer: Self-pay

## 2017-01-03 ENCOUNTER — Encounter: Payer: Self-pay | Admitting: Podiatry

## 2017-01-03 ENCOUNTER — Ambulatory Visit (INDEPENDENT_AMBULATORY_CARE_PROVIDER_SITE_OTHER): Payer: Medicare Other | Admitting: Podiatry

## 2017-01-03 DIAGNOSIS — M79676 Pain in unspecified toe(s): Secondary | ICD-10-CM | POA: Diagnosis not present

## 2017-01-03 DIAGNOSIS — Q828 Other specified congenital malformations of skin: Secondary | ICD-10-CM | POA: Diagnosis not present

## 2017-01-03 DIAGNOSIS — B351 Tinea unguium: Secondary | ICD-10-CM

## 2017-01-03 NOTE — Progress Notes (Signed)
Presents today for chief complaint of painful elongated toenails with corns and calluses.  Objective: Vital signs are stable alert and oriented 3 nails are long thick yellow dystrophic mycotic painful palpation toenails are thick as well as porokeratosis to the plantar aspect bilateral foot and the distal aspect of the toes.  Assessment: Pain in limb secondary to Onychomycosis porokeratosis bilateral.  Plan: Debridement of toenails and corns and calluses.

## 2017-02-13 ENCOUNTER — Ambulatory Visit (INDEPENDENT_AMBULATORY_CARE_PROVIDER_SITE_OTHER): Payer: Medicare Other | Admitting: Vascular Surgery

## 2017-02-13 ENCOUNTER — Ambulatory Visit (INDEPENDENT_AMBULATORY_CARE_PROVIDER_SITE_OTHER): Payer: Medicare Other

## 2017-02-13 ENCOUNTER — Encounter (INDEPENDENT_AMBULATORY_CARE_PROVIDER_SITE_OTHER): Payer: Self-pay | Admitting: Vascular Surgery

## 2017-02-13 VITALS — BP 144/87 | HR 83 | Resp 16 | Ht 64.5 in | Wt 160.0 lb

## 2017-02-13 DIAGNOSIS — I739 Peripheral vascular disease, unspecified: Secondary | ICD-10-CM | POA: Diagnosis not present

## 2017-02-13 DIAGNOSIS — L97919 Non-pressure chronic ulcer of unspecified part of right lower leg with unspecified severity: Secondary | ICD-10-CM

## 2017-02-13 DIAGNOSIS — I890 Lymphedema, not elsewhere classified: Secondary | ICD-10-CM | POA: Insufficient documentation

## 2017-02-13 DIAGNOSIS — I83019 Varicose veins of right lower extremity with ulcer of unspecified site: Secondary | ICD-10-CM

## 2017-02-13 DIAGNOSIS — E785 Hyperlipidemia, unspecified: Secondary | ICD-10-CM | POA: Diagnosis not present

## 2017-02-13 NOTE — Assessment & Plan Note (Signed)
Ulcer is slowly healing. Continue compression stockings and elevation. At the lymphedema pump.

## 2017-02-13 NOTE — Assessment & Plan Note (Signed)
lipid control important in reducing the progression of atherosclerotic disease. Continue statin therapy  

## 2017-02-13 NOTE — Progress Notes (Signed)
MRN : [id number]  [name] is a 83 y.o. (1923-11-09) female who presents with chief complaint of  Chief Complaint  Patient presents with  . Re-evaluation    ABI ultrasound follow up  .  History of Present Illness: Patient returns today in follow up of Right lateral ankle ulceration and vascular disease. She has undergone laser ablation of the right small saphenous vein as well as right lower extremity arterial revascularization several months ago. Her ABIs today are normal and she has normal digital pressure and waveform on the right leg. Her left digital pressure and waveform arm moderately reduced but she currently has no left leg symptoms. Her wound is getting better but not entirely healed. She is still having some mild to moderate swelling on the right leg.   Review of Systems  Constitutional: Negative.   HENT: Negative.   Eyes: Negative.   Respiratory: Negative.   Cardiovascular: Negative.   Gastrointestinal: Negative.   Endocrine: Negative.   Genitourinary: Negative.   Musculoskeletal: Negative.   Skin: Positive for wound (Right Lateral Ankle).  Allergic/Immunologic: Negative.   Neurological: Negative.   Hematological: Negative.   Psychiatric/Behavioral: Negative.       Objective:   Physical Exam  Constitutional: She is oriented to person, place, and time. She appears well-developed and well-nourished.  HENT:  Head: Normocephalic and atraumatic.  Right Ear: External ear normal.  Left Ear: External ear normal.  Eyes: Conjunctivae and EOM are normal. Pupils are equal, round, and reactive to light.  Neck: Normal range of motion.  Cardiovascular: Normal rate, regular rhythm and normal heart sounds.   Pulses:      Radial pulses are 2+ on the right side, and 2+ on the left side.       Dorsalis pedis pulses are 2+ on the right side, and 2+ on the left side.       Posterior tibial pulses are 2+ on the right side, and 2+ on the left side.  Pulmonary/Chest:  Effort normal and breath sounds normal.  Abdominal: Soft. Bowel sounds are normal.  Musculoskeletal: Normal range of motion. She exhibits no edema of the left leg but does have 1+ edema on the right leg.  Neurological: She is alert and oriented to person, place, and time.  Skin:  Small ulceration noted on lateral ankle. Scab covering. No infection or cellulitis.  Psychiatric: She has a normal mood and affect. Her behavior is normal. Judgment and thought content normal.   BP 127/76 (BP Location: Right Arm)   Pulse 83   Resp 16   Ht 5\' 5"  (1.651 m)   Wt 160 lb (72.6 kg)   BMI 26.63 kg/m       Past Medical History:  Diagnosis Date  . Peripheral vascular disease ([place])   . Varicose veins    Social History        Social History  . Marital status: Married    Spouse name: N/A  . Number of children: N/A  . Years of education: N/A      Occupational History  . Not on file.       Social History Main Topics  . Smoking status: Never Smoker  . Smokeless tobacco: Not on file  . Alcohol use No  . Drug use: No  . Sexual activity: Not on file       Other Topics Concern  . Not on file      Social History Narrative  . No narrative on file  Past Surgical History:  Procedure Laterality Date  . APPENDECTOMY    . BASAL CELL CARCINOMA EXCISION Left   . BREAST CYST EXCISION Left   . CATARACT EXTRACTION Left   . CHOLECYSTECTOMY    . EYE SURGERY    . PACEMAKER PLACEMENT    . PERIPHERAL VASCULAR CATHETERIZATION Right 10/09/2016   Procedure: Lower Extremity Angiography;  Surgeon: Algernon Huxley, MD;  Location: [facility];  Service: Cardiovascular;  Laterality: Right;  . PERIPHERAL VASCULAR CATHETERIZATION  10/09/2016   Procedure: Lower Extremity Intervention;  Surgeon: Algernon Huxley, MD;  Location: [facility];  Service: Cardiovascular;;  . TONSILLECTOMY          Family History  Problem Relation Age of Onset  . Stroke Mother   .  Cancer Father        Allergies  Allergen Reactions  . Metronidazole Nausea And Vomiting, Other (See Comments) and Rash  . Monistat  [Tioconazole] Rash and Other (See Comments)  . Sulfa Antibiotics Rash and Nausea And Vomiting     Labs No results found for this or any previous visit (from the past 2160 hour(s)).  Radiology No results found.    Assessment/Plan  HLD (hyperlipidemia) lipid control important in reducing the progression of atherosclerotic disease. Continue statin therapy   PVD (peripheral vascular disease) (HCC) ABIs noncompressible, but digit pressure and waveforms are normal on the right and moderately reduced on the left. Her stent is widely patent on duplex. Plan to recheck in 6 months.  Varicose veins of lower extremities with ulcer ([place]) Ulcer is slowly healing. Continue compression stockings and elevation. At the lymphedema pump.  Lymphedema I believe the patient has stage II acquired lymphedema due to chronic scarring lymphatic channels from chronic venous insufficiency and ulceration. I believe she would benefit from a lymphedema pump. We will try to obtain this in the near future at her convenience. She will use this twice daily. I will see her back in 6 months.    Leotis Pain, MD  02/13/2017 12:19 PM    This note was created with Dragon medical transcription system.  Any errors from dictation are purely unintentional

## 2017-02-13 NOTE — Assessment & Plan Note (Signed)
ABIs noncompressible, but digit pressure and waveforms are normal on the right and moderately reduced on the left. Her stent is widely patent on duplex. Plan to recheck in 6 months.

## 2017-02-13 NOTE — Assessment & Plan Note (Signed)
I believe the patient has stage II acquired lymphedema due to chronic scarring lymphatic channels from chronic venous insufficiency and ulceration. I believe she would benefit from a lymphedema pump. We will try to obtain this in the near future at her convenience. She will use this twice daily. I will see her back in 6 months.

## 2017-03-28 ENCOUNTER — Ambulatory Visit (INDEPENDENT_AMBULATORY_CARE_PROVIDER_SITE_OTHER): Payer: Medicare Other | Admitting: Podiatry

## 2017-03-28 DIAGNOSIS — B351 Tinea unguium: Secondary | ICD-10-CM | POA: Diagnosis not present

## 2017-03-28 DIAGNOSIS — Q828 Other specified congenital malformations of skin: Secondary | ICD-10-CM | POA: Diagnosis not present

## 2017-03-28 DIAGNOSIS — L97311 Non-pressure chronic ulcer of right ankle limited to breakdown of skin: Secondary | ICD-10-CM

## 2017-03-28 DIAGNOSIS — M79676 Pain in unspecified toe(s): Secondary | ICD-10-CM | POA: Diagnosis not present

## 2017-03-28 NOTE — Progress Notes (Signed)
She presents today for chief complaint of painful elongated toenails follow-up of her vascular ulcer to the fibular malleolus. She states that she has been sleeping in her compression hose for the past several nights because of her husband who is in health care.  Objective: Vital signs are stable alert and oriented 3. Pulses are barely palpable. Ulceration lateral aspect of the right ankle appears to be healing but is slow to do so does not appear to be clinically infected there is no surrounding erythema. No malodor. She has long thick yellow dystrophic mycotic nails with distal clavi and porokeratosis plantar aspect of the bilateral foot.  Assessment: Pain limp secondary to vascular ulcer right. Pain limbs secondary to onychomycosis and porokeratosis.  Plan: Debridement of toenails 1 through 5 bilateral. Redressed the ulcerative lesion today with Silvadene cream and offload padding. I also debrided porokeratotic lesions. Follow up with her in 2 months for ulcer recheck.

## 2017-04-17 ENCOUNTER — Encounter (INDEPENDENT_AMBULATORY_CARE_PROVIDER_SITE_OTHER): Payer: Medicare Other

## 2017-04-17 ENCOUNTER — Ambulatory Visit (INDEPENDENT_AMBULATORY_CARE_PROVIDER_SITE_OTHER): Payer: Medicare Other | Admitting: Vascular Surgery

## 2017-04-17 ENCOUNTER — Encounter (INDEPENDENT_AMBULATORY_CARE_PROVIDER_SITE_OTHER): Payer: Self-pay

## 2017-05-23 ENCOUNTER — Ambulatory Visit (INDEPENDENT_AMBULATORY_CARE_PROVIDER_SITE_OTHER): Payer: Medicare Other | Admitting: Podiatry

## 2017-05-23 ENCOUNTER — Encounter: Payer: Self-pay | Admitting: Podiatry

## 2017-05-23 DIAGNOSIS — L97311 Non-pressure chronic ulcer of right ankle limited to breakdown of skin: Secondary | ICD-10-CM

## 2017-05-23 DIAGNOSIS — M79676 Pain in unspecified toe(s): Secondary | ICD-10-CM | POA: Diagnosis not present

## 2017-05-23 DIAGNOSIS — Q828 Other specified congenital malformations of skin: Secondary | ICD-10-CM

## 2017-05-23 DIAGNOSIS — B351 Tinea unguium: Secondary | ICD-10-CM

## 2017-05-23 MED ORDER — MUPIROCIN 2 % EX OINT: TOPICAL_OINTMENT | CUTANEOUS | 1 refills | Status: DC

## 2017-05-23 MED ORDER — NONFORMULARY OR COMPOUNDED ITEM: 2 refills | Status: DC

## 2017-05-23 NOTE — Addendum Note (Signed)
Addended by: Graceann Congress D on: 05/23/2017 05:05 PM   Modules accepted: Orders

## 2017-05-23 NOTE — Progress Notes (Signed)
She presents today for follow-up routine nails and calluses. She is also complaining of a painful ulcerative lesion to the lateral aspect of her right ankle. She states this seems to be getting bigger. She has noticed no drainage or odor and has had no change in her health as of late. Pain in her feet bilaterally  Objective: Vital signs are stable pulses are barely palpable right capillary fill time is delayed ulceration measuring less than a centimeter in diameter less than 5 mm in depth with fibrin deposition to the right ankle. Toenails are long thick yellow dystrophic onychomycotic multiple calluses plantar aspect of the foot.  Assessment: Ischemic ulceration right. Pain in limb cemented onychomycosis porokeratosis. Diabetic neuropathy  Plan: She needs to follow-up with vascular clinic for reevaluation of her right lower extremity after stenting over 1 year ago. I'm prescribing a compounded neuropathy cream. I'm also prescribing Bactroban ointment to be applied daily to the right ankle she is to keep this clean and dry and cover with pain. She will follow up with Dr. Amalia Hailey for possible grafts.

## 2017-05-24 ENCOUNTER — Telehealth: Payer: Self-pay | Admitting: Podiatry

## 2017-05-24 NOTE — Telephone Encounter (Signed)
Patient was seen by Dr. Milinda Pointer yesterday and was prescribed two different creams to use. One says to use twice daily after soaking the wound. Patient wants to know what to soak the wound in.

## 2017-05-25 NOTE — Telephone Encounter (Signed)
I spoke with patient yesterday and gave her the soaking instructions along with directions on using her prescribed medication.  Patient verbally understood.

## 2017-06-15 ENCOUNTER — Ambulatory Visit (INDEPENDENT_AMBULATORY_CARE_PROVIDER_SITE_OTHER): Payer: Medicare Other | Admitting: Vascular Surgery

## 2017-06-15 ENCOUNTER — Encounter (INDEPENDENT_AMBULATORY_CARE_PROVIDER_SITE_OTHER): Payer: Self-pay | Admitting: Vascular Surgery

## 2017-06-15 VITALS — BP 128/71 | HR 87 | Resp 14 | Ht 65.5 in | Wt 155.0 lb

## 2017-06-15 DIAGNOSIS — I83019 Varicose veins of right lower extremity with ulcer of unspecified site: Secondary | ICD-10-CM | POA: Diagnosis not present

## 2017-06-15 DIAGNOSIS — I739 Peripheral vascular disease, unspecified: Secondary | ICD-10-CM

## 2017-06-15 DIAGNOSIS — L97919 Non-pressure chronic ulcer of unspecified part of right lower leg with unspecified severity: Secondary | ICD-10-CM | POA: Diagnosis not present

## 2017-06-15 DIAGNOSIS — E785 Hyperlipidemia, unspecified: Secondary | ICD-10-CM

## 2017-06-15 NOTE — Assessment & Plan Note (Signed)
Swelling better and wound seems to be improving. Continue compression stockings and elevation

## 2017-06-15 NOTE — Patient Instructions (Signed)

## 2017-06-15 NOTE — Assessment & Plan Note (Signed)
lipid control important in reducing the progression of atherosclerotic disease. Continue statin therapy  

## 2017-06-15 NOTE — Assessment & Plan Note (Signed)
Her perfusion was reasonably normal the spring. She is scheduled to come back in the next month or 2 for follow-up noninvasive studies and I think that will be reasonable unless her wound significantly deteriorates in the interim.

## 2017-06-15 NOTE — Progress Notes (Signed)
MRN : [id number]  [name] is a 83 y.o. (17-Jun-1923) female who presents with chief complaint of  Chief Complaint  Patient presents with  . Re-evaluation    Leg swelling  .  History of Present Illness: Patient returns today in follow up of Right lateral ankle ulceration in association with PAD and venous disease. Several weeks ago, her leg was quite swollen and her wound was not improving. Her leg swelling has now essentially resolved bilaterally and she says her wound is getting a lot better. She had her arterial perfusion checked the spring and it was normal. She has no fevers or chills. She has no other complaints today  Current Outpatient Prescriptions  Medication Sig Dispense Refill  . acetaminophen (TYLENOL) 650 MG CR tablet Take 650 mg by mouth every 8 (eight) hours as needed for pain.    Marland Kitchen aspirin EC 81 MG tablet Take 81 mg by mouth daily.    . Biotin 1000 MCG tablet Take 1,000 mcg by mouth daily.    . calcium carbonate (OSCAL) 1500 (600 Ca) MG TABS tablet Take 600 mg of elemental calcium by mouth daily.    Marland Kitchen CARTIA XT 300 MG 24 hr capsule Take 300 mg by mouth daily.     . Cholecalciferol (VITAMIN D) 2000 units CAPS Take 2,000 Units by mouth daily.    . dabigatran (PRADAXA) 150 MG CAPS capsule Take 150 mg by mouth 2 (two) times daily.    . fluticasone (FLONASE) 50 MCG/ACT nasal spray Place into the nose.    . furosemide (LASIX) 40 MG tablet Take 40 mg by mouth daily.    . Glucosamine 500 MG CAPS Take 500 mg by mouth daily.    Marland Kitchen levothyroxine (SYNTHROID, LEVOTHROID) 50 MCG tablet Take 50 mcg by mouth daily before breakfast.     . losartan (COZAAR) 50 MG tablet Take 50 mg by mouth daily.    . magnesium oxide (MAG-OX) 400 MG tablet Take 400 mg by mouth daily.    . metoprolol (LOPRESSOR) 50 MG tablet Take 50 mg by mouth 2 (two) times daily.    . Multiple Vitamin (MULTIVITAMIN WITH MINERALS) TABS tablet Take 1 tablet by mouth daily.    . Multiple Vitamins-Minerals  (PRESERVISION/LUTEIN PO) Take 1 capsule by mouth daily.    . mupirocin ointment (BACTROBAN) 2 % Apply to wound after soaking BID 30 g 1  . NONFORMULARY OR COMPOUNDED ITEM See pharmacy note 120 each 2  . omeprazole (PRILOSEC) 20 MG capsule Take by mouth daily.     Marland Kitchen oseltamivir (TAMIFLU) 75 MG capsule     . potassium chloride SA (K-DUR,KLOR-CON) 20 MEQ tablet Take 20 mEq by mouth daily.    . rosuvastatin (CRESTOR) 5 MG tablet Take 5 mg by mouth every Monday, Wednesday, and Friday.    . tolterodine (DETROL) 2 MG tablet Take 2 mg by mouth daily.    . traMADol (ULTRAM) 50 MG tablet Take 50 mg by mouth every 6 (six) hours as needed for pain.    . vitamin B-12 (CYANOCOBALAMIN) 1000 MCG tablet Take 1,000 mcg by mouth daily.     No current facility-administered medications for this visit.     Past Medical History:  Diagnosis Date  . Peripheral vascular disease ([name])   . Varicose veins     Past Surgical History:  Procedure Laterality Date  . APPENDECTOMY    . BASAL CELL CARCINOMA EXCISION Left   . BREAST CYST EXCISION Left   .  CATARACT EXTRACTION Left   . CHOLECYSTECTOMY    . EYE SURGERY    . PACEMAKER PLACEMENT    . PERIPHERAL VASCULAR CATHETERIZATION Right 10/09/2016   Procedure: Lower Extremity Angiography;  Surgeon: Algernon Huxley, MD;  Location: [facility];  Service: Cardiovascular;  Laterality: Right;  . PERIPHERAL VASCULAR CATHETERIZATION  10/09/2016   Procedure: Lower Extremity Intervention;  Surgeon: Algernon Huxley, MD;  Location: [facility];  Service: Cardiovascular;;  . TONSILLECTOMY      Family History  Problem Relation Age of Onset  . Stroke Mother   . Cancer Father    No bleeding or clotting disorders  Social History     Social History  Substance Use Topics  . Smoking status: Never Smoker  . Smokeless tobacco: Not on file  . Alcohol use No   Married, lives with husband      Allergies  Allergen Reactions  . Metronidazole Nausea And  Vomiting, Other (See Comments) and Rash  . Monistat  [Tioconazole] Rash and Other (See Comments)  . Sulfa Antibiotics Rash and Nausea And Vomiting             REVIEW OF SYSTEMS (Negative unless checked)  Constitutional: [] Weight loss  [] Fever  [] Chills Cardiac: [] Chest pain   [] Chest pressure   [x] Palpitations   [] Shortness of breath when laying flat   [] Shortness of breath at rest   [] Shortness of breath with exertion. Vascular:  [] Pain in legs with walking   [] Pain in legs at rest   [] Pain in legs when laying flat   [] Claudication   [] Pain in feet when walking  [] Pain in feet at rest  [] Pain in feet when laying flat   [] History of DVT   [] Phlebitis   [x] Swelling in legs   [x] Varicose veins   [x] Non-healing ulcers Pulmonary:   [] Uses home oxygen   [] Productive cough   [] Hemoptysis   [] Wheeze  [] COPD   [] Asthma Neurologic:  [] Dizziness  [] Blackouts   [] Seizures   [] History of stroke   [] History of TIA  [] Aphasia   [] Temporary blindness   [] Dysphagia   [] Weakness or numbness in arms   [] Weakness or numbness in legs Musculoskeletal:  [] Arthritis   [] Joint swelling   [] Joint pain   [] Low back pain Hematologic:  [] Easy bruising  [] Easy bleeding   [] Hypercoagulable state   [] Anemic  [] Hepatitis Gastrointestinal:  [] Blood in stool   [] Vomiting blood  [] Gastroesophageal reflux/heartburn   [] Abdominal pain Genitourinary:  [] Chronic kidney disease   [] Difficult urination  [] Frequent urination  [] Burning with urination   [] Hematuria Skin:  [] Rashes   [x] Ulcers   [x] Wounds Psychological:  [] History of anxiety   []  History of major depression.     Physical Examination  BP 128/71 (BP Location: Left Arm)   Pulse 87   Resp 14   Ht 5' 5.5" (1.664 m)   Wt 155 lb (70.3 kg)   BMI 25.40 kg/m  Gen:  WD/WN, NAD. Appears much younger than stated age Head: [place]/AT, No temporalis wasting. Ear/Nose/Throat: Hearing grossly intact, nares w/o erythema or drainage, trachea midline Eyes: Conjunctiva  clear. Sclera non-icteric Neck: Supple.  No JVD.  Pulmonary:  Good air movement, no use of accessory muscles.  Cardiac: Irregular Vascular:  Vessel Right Left  Radial Palpable Palpable  Ulnar Palpable Palpable  Brachial Palpable Palpable  Carotid Palpable, without bruit Palpable, without bruit  Aorta Not palpable N/A  Femoral Palpable Palpable  Popliteal 1+ Palpable 1+ Palpable  PT 1+ Palpable  1+ Palpable  DP 1+ Palpable Palpable    Musculoskeletal: M/S 5/5 throughout.  No deformity or atrophy. Trace bilateral lower extremity edema. Neurologic: Sensation grossly intact in extremities.  Symmetrical.  Speech is fluent.  Psychiatric: Judgment intact, Mood & affect appropriate for pt's clinical situation. Dermatologic: Small less than 1 cm clean ulceration on the right lateral ankle      Labs No results found for this or any previous visit (from the past 2160 hour(s)).  Radiology No results found.    Assessment/Plan   Paroxysmal atrial fibrillation (HCC) On anticoagulation  Varicose veins of lower extremities with ulcer (HCC) Swelling better and wound seems to be improving. Continue compression stockings and elevation  HLD (hyperlipidemia) lipid control important in reducing the progression of atherosclerotic disease. Continue statin therapy   PVD (peripheral vascular disease) (HCC) Her perfusion was reasonably normal the spring. She is scheduled to come back in the next month or 2 for follow-up noninvasive studies and I think that will be reasonable unless her wound significantly deteriorates in the interim.    Leotis Pain, MD  06/15/2017 2:00 PM    This note was created with Dragon medical transcription system.  Any errors from dictation are purely unintentional

## 2017-06-27 ENCOUNTER — Ambulatory Visit (INDEPENDENT_AMBULATORY_CARE_PROVIDER_SITE_OTHER): Payer: Medicare Other | Admitting: Podiatry

## 2017-06-27 DIAGNOSIS — L97311 Non-pressure chronic ulcer of right ankle limited to breakdown of skin: Secondary | ICD-10-CM

## 2017-06-27 NOTE — Progress Notes (Signed)
She presents today for follow-up of her ulceration to the lateral aspect of her right ankle. She states that I don't think it's really that much better but he doesn't look nearly as red. She states that she will be going for her vascular study in the next month or so.  Objective: Vital signs are stable alert and oriented 3 pulses are palpable. Neurologic sensorium is intact. She has what appears to be an arterial ulcer overlying the fibula. It does appear to be slightly smaller with granulation tissue this week as a postal last. I debrided the area today carefully with a cotton swab and hydrogen peroxide. She tolerated this well without a lot of discomfort. I placed a aperture pad over the wound small amount of Silvadene in the wound with a piece of gauze and paper tape. She states that she been soaking this I encouraged her to not soak this.  Assessment: Arterial ulcer right ankle.  Plan: I'll follow up with Dr. Amalia Hailey for him to evaluate this ulceration. He may know of other options to help heal this wound.

## 2017-07-20 ENCOUNTER — Ambulatory Visit (INDEPENDENT_AMBULATORY_CARE_PROVIDER_SITE_OTHER): Payer: Medicare Other | Admitting: Podiatry

## 2017-07-20 DIAGNOSIS — L97312 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d: Secondary | ICD-10-CM | POA: Diagnosis not present

## 2017-07-20 DIAGNOSIS — L97519 Non-pressure chronic ulcer of other part of right foot with unspecified severity: Secondary | ICD-10-CM

## 2017-07-20 DIAGNOSIS — I83015 Varicose veins of right lower extremity with ulcer other part of foot: Secondary | ICD-10-CM

## 2017-07-21 NOTE — Progress Notes (Signed)
   Subjective:  83 year old female presents to the office today for evaluation of an ulcer to the lateral aspect of the right ankle. Patient states that she has had the ulcer for approximately 2 years now. The wound is been the same there's been no progression. Patient presents as a referral from Dr. Milinda Pointer for further treatment and evaluation.   Objective/Physical Exam General: The patient is alert and oriented x3 in no acute distress.  Dermatology:  Wound #1 noted to the lateral aspect of the right ankle measuring approximately [phone] cm (LxWxD).   To the noted ulceration(s), there is no eschar. There is a moderate amount of slough, fibrin, and necrotic tissue noted. Granulation tissue and wound base is red. There is a minimal amount of serosanguineous drainage noted. There is no exposed bone muscle-tendon ligament or joint. There is no malodor. Periwound integrity is intact. Skin is warm, dry and supple bilateral lower extremities.  Vascular: Palpable pedal pulses bilaterally. Mild edema noted. Capillary refill within normal limits. Varicosities noted bilateral lower extremities.   Neurological: Epicritic and protective threshold absent bilaterally.   Musculoskeletal Exam: Range of motion within normal limits to all pedal and ankle joints bilateral. Muscle strength 5/5 in all groups bilateral.   Assessment: #1 ulcer right lateral ankle secondary to venous insufficiency #2 varicosities bilateral lower extremities  Plan of Care:  #1 Patient was evaluated. #2 medically necessary excisional debridement including subcutaneous tissue was performed using a tissue nipper and a chisel blade. Excisional debridement of all the necrotic nonviable tissue down to healthy bleeding viable tissue was performed with post-debridement measurements same as pre-. Dry sterile dressing applied #3 the wound was cleansed with normal saline. #4 today and refer the patient to the wound care center, Dr. Con Memos  or Dellia Nims, for possible consideration of hyperbaric oxygen treatment   #5 patient has had this wound for approximately 2 years without any alleviation of symptoms or improvement. I feel that wound care center will be the best option for the patient for new modalities to better heal this ulceration than can be provided here in the office  #6 patient is also being managed by vascular already. She has had stents placed in the past she has a follow-up appointment next month.  #7 return to clinic when necessary   Edrick Kins, DPM [facility]  Dr. Edrick Kins, [address]                                        [place], [place] [address]                Office [phone]  Fax [phone]

## 2017-07-26 NOTE — Addendum Note (Signed)
Addended by: Graceann Congress D on: 07/26/2017 04:42 PM   Modules accepted: Orders

## 2017-07-31 ENCOUNTER — Telehealth: Payer: Self-pay | Admitting: Podiatry

## 2017-07-31 NOTE — Telephone Encounter (Signed)
Patient has been notified that of her appt with Dr. Con Memos on 08/10/17 @10 :104

## 2017-07-31 NOTE — Telephone Encounter (Signed)
Yes I was there and saw Dr. Amalia Hailey on the 17 th. He told me he was going to get me referred to the wound center but I have not heard anything from anyone. I want to get my foot taken care of.

## 2017-08-10 ENCOUNTER — Encounter: Payer: Medicare Other | Attending: Surgery | Admitting: Surgery

## 2017-08-10 DIAGNOSIS — L97312 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d: Secondary | ICD-10-CM | POA: Diagnosis not present

## 2017-08-10 DIAGNOSIS — M1990 Unspecified osteoarthritis, unspecified site: Secondary | ICD-10-CM | POA: Insufficient documentation

## 2017-08-10 DIAGNOSIS — Z85828 Personal history of other malignant neoplasm of skin: Secondary | ICD-10-CM | POA: Insufficient documentation

## 2017-08-10 DIAGNOSIS — I509 Heart failure, unspecified: Secondary | ICD-10-CM | POA: Diagnosis not present

## 2017-08-10 DIAGNOSIS — Z950 Presence of cardiac pacemaker: Secondary | ICD-10-CM | POA: Diagnosis not present

## 2017-08-10 DIAGNOSIS — Z7982 Long term (current) use of aspirin: Secondary | ICD-10-CM | POA: Diagnosis not present

## 2017-08-10 DIAGNOSIS — Z79899 Other long term (current) drug therapy: Secondary | ICD-10-CM | POA: Insufficient documentation

## 2017-08-10 DIAGNOSIS — I890 Lymphedema, not elsewhere classified: Secondary | ICD-10-CM | POA: Insufficient documentation

## 2017-08-10 DIAGNOSIS — I110 Hypertensive heart disease with heart failure: Secondary | ICD-10-CM | POA: Diagnosis not present

## 2017-08-10 DIAGNOSIS — I70233 Atherosclerosis of native arteries of right leg with ulceration of ankle: Secondary | ICD-10-CM | POA: Insufficient documentation

## 2017-08-10 DIAGNOSIS — G629 Polyneuropathy, unspecified: Secondary | ICD-10-CM | POA: Diagnosis not present

## 2017-08-12 NOTE — Progress Notes (Signed)
[name], [name] ([id number]) Visit Report for 08/10/2017 Abuse/Suicide Risk Screen Details Patient Name: [name], [name]. Date of Service: 08/10/2017 10:30 AM Medical Record Number: [id number] Patient Account Number: [id number] Date of Birth/Sex: 06/05/1923 (83 y.o. Female) Treating RN: Ahmed Prima Primary Care Tameaka Eichhorn: FITZGERALD, DAVID Other Clinician: Referring Kamaron Deskins: Daylene Katayama Treating Rosetta Rupnow/Extender: Frann Rider in Treatment: 0 Abuse/Suicide Risk Screen Items Answer ABUSE/SUICIDE RISK SCREEN: Has anyone close to you tried to hurt or harm you recentlyo No Do you feel uncomfortable with anyone in your familyo No Has anyone forced you do things that you didnot want to doo No Do you have any thoughts of harming yourselfo No Patient displays signs or symptoms of abuse and/or neglect. No Electronic Signature(s) Signed: 08/10/2017 4:50:20 PM By: Alric Quan Entered By: Alric Quan on 08/10/2017 10:37:35 Ploeger, [name] ([id number]) -------------------------------------------------------------------------------- Activities of Daily Living Details Patient Name: [name], [name] Date of Service: 08/10/2017 10:30 AM Medical Record Number: [id number] Patient Account Number: [id number] Date of Birth/Sex: Jan 09, 1923 (83 y.o. Female) Treating RN: Ahmed Prima Primary Care Giann Obara: Ola Spurr, DAVID Other Clinician: Referring Chyna Kneece: Daylene Katayama Treating Fumiye Lubben/Extender: Frann Rider in Treatment: 0 Activities of Daily Living Items Answer Activities of Daily Living (Please select one for each item) Drive Automobile Completely Able Take Medications Completely Able Use Telephone Completely Able Care for Appearance Completely Able Use Toilet Completely Able Bath / Shower Completely Able Dress Self Completely Able Feed Self Completely Able Walk Completely Able Get In / Out Bed Completely Able Housework Completely Able Prepare Meals  Completely Able Handle Money Completely Able Shop for Self Completely Able Electronic Signature(s) Signed: 08/10/2017 4:50:20 PM By: Alric Quan Entered By: Alric Quan on 08/10/2017 10:37:55 Pillsbury, [name] ([id number]) -------------------------------------------------------------------------------- Education Assessment Details Patient Name: [name] Date of Service: 08/10/2017 10:30 AM Medical Record Number: [id number] Patient Account Number: [id number] Date of Birth/Sex: March 24, 1923 (83 y.o. Female) Treating RN: Ahmed Prima Primary Care Alisea Matte: [facility], DAVID Other Clinician: Referring Annora Guderian: Daylene Katayama Treating Quandarius Nill/Extender: Frann Rider in Treatment: 0 Primary Learner Assessed: Patient Learning Preferences/Education Level/Primary Language Learning Preference: Explanation, Printed Material Highest Education Level: College or Above Preferred Language: English Cognitive Barrier Assessment/Beliefs Language Barrier: No Translator Needed: No Memory Deficit: No Emotional Barrier: No Cultural/Religious Beliefs Affecting Medical No Care: Physical Barrier Assessment Impaired Vision: No Impaired Hearing: No Decreased Hand dexterity: No Knowledge/Comprehension Assessment Knowledge Level: Medium Comprehension Level: Medium Ability to understand written Medium instructions: Ability to understand verbal Medium instructions: Motivation Assessment Anxiety Level: Calm Cooperation: Cooperative Education Importance: Acknowledges Need Interest in Health Problems: Asks Questions Perception: Coherent Willingness to Engage in Self- Medium Management Activities: Readiness to Engage in Self- Medium Management Activities: Electronic Signature(s) RILEA, ARUTYUNYAN ([id number]) Signed: 08/10/2017 4:50:20 PM By: Alric Quan Entered By: Alric Quan on 08/10/2017 10:38:32 [name]  ([id number]) -------------------------------------------------------------------------------- Fall Risk Assessment Details Patient Name: [name] Date of Service: 08/10/2017 10:30 AM Medical Record Number: [id number] Patient Account Number: [id number] Date of Birth/Sex: 1923/05/10 (83 y.o. Female) Treating RN: Ahmed Prima Primary Care Baruch Lewers: FITZGERALD, DAVID Other Clinician: Referring Neysa Arts: Daylene Katayama Treating Joshwa Hemric/Extender: Frann Rider in Treatment: 0 Fall Risk Assessment Items Have you had 2 or more falls in the last 12 monthso 0 Yes Have you had any fall that resulted in injury in the last 12 monthso 0 No FALL RISK ASSESSMENT: History of falling - immediate or within 3 months 0 No Secondary diagnosis 0 No Ambulatory aid None/bed rest/wheelchair/nurse 0 No Crutches/cane/walker 0 No  Furniture 0 No IV Access/Saline Lock 0 No Gait/Training Normal/bed rest/immobile 0 No Weak 0 No Impaired 0 No Mental Status Oriented to own ability 0 Yes Electronic Signature(s) Signed: 08/10/2017 4:50:20 PM By: Alric Quan Entered By: Alric Quan on 08/10/2017 10:38:55 Taher, [name] ([id number]) -------------------------------------------------------------------------------- Foot Assessment Details Patient Name: [name] Date of Service: 08/10/2017 10:30 AM Medical Record Number: [id number] Patient Account Number: [id number] Date of Birth/Sex: Jul 06, 1923 (83 y.o. Female) Treating RN: Ahmed Prima Primary Care Braison Snoke: FITZGERALD, DAVID Other Clinician: Referring Klaudia Beirne: Daylene Katayama Treating Zakaree Mcclenahan/Extender: Frann Rider in Treatment: 0 Foot Assessment Items Site Locations + = Sensation present, - = Sensation absent, C = Callus, U = Ulcer R = Redness, W = Warmth, M = Maceration, PU = Pre-ulcerative lesion F = Fissure, S = Swelling, D = Dryness Assessment Right: Left: Other Deformity: No No Prior Foot Ulcer: No  No Prior Amputation: No No Charcot Joint: No No Ambulatory Status: Ambulatory Without Help Gait: Steady Notes pt states the places that she can feel it ...she can barely feel it Electronic Signature(s) Signed: 08/10/2017 4:50:20 PM By: Alric Quan Entered By: Alric Quan on 08/10/2017 10:45:21 Fix, [name] ([id number]) Joni Fears, [name] ([id number]) -------------------------------------------------------------------------------- Nutrition Risk Assessment Details Patient Name: [name], [name] Date of Service: 08/10/2017 10:30 AM Medical Record Number: [id number] Patient Account Number: [id number] Date of Birth/Sex: Feb 21, 1923 (83 y.o. Female) Treating RN: Ahmed Prima Primary Care Kalyssa Anker: FITZGERALD, DAVID Other Clinician: Referring Luwana Butrick: Daylene Katayama Treating Linsey Arteaga/Extender: Frann Rider in Treatment: 0 Height (in): 65 Weight (lbs): 154.3 Body Mass Index (BMI): 25.7 Nutrition Risk Assessment Items NUTRITION RISK SCREEN: I have an illness or condition that made me change the kind and/or 2 Yes amount of food I eat I eat fewer than two meals per day 3 Yes I eat few fruits and vegetables, or milk products 0 No I have three or more drinks of beer, liquor or wine almost every day 0 No I have tooth or mouth problems that make it hard for me to eat 0 No I don't always have enough money to buy the food I need 0 No I eat alone most of the time 0 No I take three or more different prescribed or over-the-counter drugs a 1 Yes day Without wanting to, I have lost or gained 10 pounds in the last six 0 No months I am not always physically able to shop, cook and/or feed myself 0 No Nutrition Protocols Good Risk Protocol Moderate Risk Protocol Electronic Signature(s) Signed: 08/10/2017 4:50:20 PM By: Alric Quan Entered By: Alric Quan on 08/10/2017 10:39:41

## 2017-08-12 NOTE — Progress Notes (Signed)
JOELEEN, WORTLEY ([id number]) Visit Report for 08/10/2017 Chief Complaint Document Details Patient Name: [name], [name]. Date of Service: 08/10/2017 10:30 AM Medical Record Number: [id number] Patient Account Number: [id number] Date of Birth/Sex: 1923/11/05 (83 y.o. Female) Treating RN: Ahmed Prima Primary Care Provider: Ola Spurr, DAVID Other Clinician: Referring Provider: Daylene Katayama Treating Provider/Extender: Frann Rider in Treatment: 0 Information Obtained from: Patient Chief Complaint Patient presents to the wound care center for a consult due non healing wound to the right lateral malleolus which she's had for 2 years Electronic Signature(s) Signed: 08/10/2017 3:48:14 PM By: Christin Fudge MD, FACS Entered By: Christin Fudge on 08/10/2017 11:58:40 Larimer, [name] ([id number]) -------------------------------------------------------------------------------- Debridement Details Patient Name: [name] Date of Service: 08/10/2017 10:30 AM Medical Record Number: [id number] Patient Account Number: [id number] Date of Birth/Sex: 11/17/1923 (83 y.o. Female) Treating RN: Ahmed Prima Primary Care Provider: FITZGERALD, DAVID Other Clinician: Referring Provider: Daylene Katayama Treating Provider/Extender: Frann Rider in Treatment: 0 Debridement Performed for Wound #1 Right,Lateral Malleolus Assessment: Performed By: Physician Christin Fudge, MD Debridement: Debridement Pre-procedure Verification/Time Out Yes - 11:13 Taken: Start Time: 11:14 Pain Control: Lidocaine 4% Topical Solution Level: Skin/Subcutaneous Tissue Total Area Debrided (L x 1.5 (cm) x 1.3 (cm) = 1.95 (cm) W): Tissue and other Viable, Non-Viable, Exudate, Fibrin/Slough, Subcutaneous material debrided: Instrument: Curette Bleeding: Minimum Hemostasis Achieved: Pressure End Time: 11:16 Procedural Pain: 0 Post Procedural Pain: 0 Response to Treatment: Procedure was tolerated  well Post Debridement Measurements of Total Wound Length: (cm) 1.5 Width: (cm) 1.3 Depth: (cm) 0.4 Volume: (cm) 0.613 Character of Wound/Ulcer Post Requires Further Debridement Debridement: Post Procedure Diagnosis Same as Pre-procedure Electronic Signature(s) Signed: 08/10/2017 3:48:14 PM By: Christin Fudge MD, FACS Signed: 08/10/2017 4:50:20 PM By: Alric Quan Entered By: Christin Fudge on 08/10/2017 11:58:21 Salak, [name] ([id number]) -------------------------------------------------------------------------------- HPI Details Patient Name: [name] Date of Service: 08/10/2017 10:30 AM Medical Record Number: [id number] Patient Account Number: [id number] Date of Birth/Sex: 06/04/1923 (83 y.o. Female) Treating RN: Ahmed Prima Primary Care Provider: FITZGERALD, DAVID Other Clinician: Referring Provider: Daylene Katayama Treating Provider/Extender: Frann Rider in Treatment: 0 History of Present Illness Location: Patient presents with an ulcer on the right lateral malleolus Quality: Patient reports experiencing a dull pain to affected area(s). Severity: Patient states wound are getting worse. Duration: Patient has had the wound for >2 years prior to seeking treatment at the wound center Timing: Pain in wound is constant (hurts all the time) Context: The wound would happen gradually Modifying Factors: Other treatment(s) tried include:seen a podiatrist on a vascular surgeons and under treatment with them Associated Signs and Symptoms: Patient reports having increase swelling. 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 Dr. Amalia Hailey the podiatrist saw her on 07/20/2017 and sent her to the wound center for possible hyperbaric oxygen therapy. past medical history of peripheral vascular disease,  varicose veins, status post appendectomy, basal cell carcinoma excision from the left leg, cholecystectomy, pacemaker placement, right lower extremity angiography done by Dr. dew in March 2017 with placement of a stent. there is also note of a successful ablation of the right small saphenous vein done which was reviewed by ultrasound on 10/24/2016. the patient had a right small saphenous vein ablation done on 10/20/2016. The patient has never been a smoker. She has been seen by Dr. Corene Cornea dew the vascular surgeon who most recently  saw her on 06/15/2017 for evaluation of ongoing problems with right leg swelling. She had a lower extremity arterial duplex examination done(02/13/17) which showed patent distal right superficial femoral artery stent and above-the- knee popliteal stent without evidence of restenosis. The ABI was more than 1.3 on the right and more than 1.3 on the left. This was consistent with noncompressible arteries due to medial calcification. The right great toe pressure and PPG waveforms are within normal limits and the left great toe pressure and PPG waveforms are decreased. he recommended she continue to wear her compression stockings and continue with elevation. She is scheduled to have a noninvasive arterial study in the near future Electronic Signature(s) Signed: 08/10/2017 3:48:14 PM By: Christin Fudge MD, FACS Entered By: Christin Fudge on 08/10/2017 12:08:44 [name] ([id number]) -------------------------------------------------------------------------------- Physical Exam Details Patient Name: [name] Date of Service: 08/10/2017 10:30 AM Medical Record Number: [id number] Patient Account Number: [id number] Date of Birth/Sex: 06/12/1923 (83 y.o. Female) Treating RN: Ahmed Prima Primary Care Provider: FITZGERALD, DAVID Other Clinician: Referring Provider: Daylene Katayama Treating Provider/Extender: Frann Rider in Treatment: 0 Constitutional .  Pulse regular. Respirations normal and unlabored. Afebrile. . Eyes Nonicteric. Reactive to light. Ears, Nose, Mouth, and Throat Lips, teeth, and gums WNL.Marland Kitchen Moist mucosa without lesions. Neck supple and nontender. No palpable supraclavicular or cervical adenopathy. Normal sized without goiter. Respiratory WNL. No retractions.. Cardiovascular Pedal Pulses WNL. March 2018 ABIs noted and she has another upcoming ABI to be done soon. she has stage to lymphedema of the right lower extremity which is more than the left lower extremity. Chest Breasts symmetical and no nipple discharge.. Breast tissue WNL, no masses, lumps, or tenderness.. Gastrointestinal (GI) Abdomen without masses or tenderness.. No liver or spleen enlargement or tenderness.. Lymphatic No adneopathy. No adenopathy. No adenopathy. Musculoskeletal Adexa without tenderness or enlargement.. Digits and nails w/o clubbing, cyanosis, infection, petechiae, ischemia, or inflammatory conditions.. Integumentary (Hair, Skin) No suspicious lesions. No crepitus or fluctuance. No peri-wound warmth or erythema. No masses.Marland Kitchen Psychiatric Judgement and insight Intact.. No evidence of depression, anxiety, or agitation.. Notes the patient has a open ulcer on the right lateral ankle which is covered with a lot of slough and after sharply debriding this with a #3 curet this is fairly clean and does not probe down to bone Electronic Signature(s) Signed: 08/10/2017 3:48:14 PM By: Christin Fudge MD, FACS Entered By: Christin Fudge on 08/10/2017 12:09:39 [name] ([id number]) [name] ([id number]) -------------------------------------------------------------------------------- Physician Orders Details Patient Name: [name] Date of Service: 08/10/2017 10:30 AM Medical Record Number: [id number] Patient Account Number: [id number] Date of Birth/Sex: 04-Feb-1923 (83 y.o. Female) Treating RN: Ahmed Prima Primary Care  Provider: FITZGERALD, DAVID Other Clinician: Referring Provider: Daylene Katayama Treating Provider/Extender: Frann Rider in Treatment: 0 Verbal / Phone Orders: No Diagnosis Coding Wound Cleansing Wound #1 Right,Lateral Malleolus o Clean wound with Normal Saline. o Cleanse wound with mild soap and water o May Shower, gently pat wound dry prior to applying new dressing. Anesthetic Wound #1 Right,Lateral Malleolus o Topical Lidocaine 4% cream applied to wound bed prior to debridement - for clinic use Skin Barriers/Peri-Wound Care Wound #1 Right,Lateral Malleolus o Skin Prep Primary Wound Dressing Wound #1 Right,Lateral Malleolus o Santyl Ointment Secondary Dressing Wound #1 Right,Lateral Malleolus o Dry Gauze o Boardered Foam Dressing Dressing Change Frequency Wound #1 Right,Lateral Malleolus o Change dressing every day. Follow-up Appointments Wound #1 Right,Lateral Malleolus o Return Appointment in 1 week. Edema Control Wound #1 Right,Lateral Malleolus   o Elevate legs to the level of the heart and pump ankles as often as possible o Other: - wear your compression stockings Dosch, [name] ([id number]) Off-Loading Wound #1 Right,Lateral Malleolus o Turn and reposition every 2 hours o Other: - do not put pressure on your right ankle Additional Orders / Instructions Wound #1 Right,Lateral Malleolus o Increase protein intake. o Other: - Add Vitamin C, Zinc, Vitamin A to your diet Patient Medications Allergies: sulfa, metronidazole, monistat Notifications Medication Indication Start End Santyl 08/10/2017 DOSE topical 250 unit/gram ointment - ointment topical as directed Electronic Signature(s) Signed: 08/10/2017 3:48:14 PM By: Christin Fudge MD, FACS Previous Signature: 08/10/2017 12:10:36 PM Version By: Christin Fudge MD, FACS Entered By: Christin Fudge on 08/10/2017 12:10:52 Snuffer, [name]  ([id number]) -------------------------------------------------------------------------------- Problem List Details Patient Name: [name] Date of Service: 08/10/2017 10:30 AM Medical Record Number: [id number] Patient Account Number: [id number] Date of Birth/Sex: Jun 04, 1923 (83 y.o. Female) Treating RN: Ahmed Prima Primary Care Provider: Ola Spurr, DAVID Other Clinician: Referring Provider: Daylene Katayama Treating Provider/Extender: Frann Rider in Treatment: 0 Active Problems ICD-10 Encounter Code Description Active Date Diagnosis L97.312 Non-pressure chronic ulcer of right ankle with fat layer 08/10/2017 Yes exposed I70.233 Atherosclerosis of native arteries of right leg with 08/10/2017 Yes ulceration of ankle I89.0 Lymphedema, not elsewhere classified 08/10/2017 Yes Inactive Problems Resolved Problems Electronic Signature(s) Signed: 08/10/2017 3:48:14 PM By: Christin Fudge MD, FACS Entered By: Christin Fudge on 08/10/2017 11:58:08 [name] ([id number]) -------------------------------------------------------------------------------- Progress Note Details Patient Name: [name] Date of Service: 08/10/2017 10:30 AM Medical Record Number: [id number] Patient Account Number: [id number] Date of Birth/Sex: December 15, 1922 (83 y.o. Female) Treating RN: Ahmed Prima Primary Care Provider: Ola Spurr, DAVID Other Clinician: Referring Provider: Daylene Katayama Treating Provider/Extender: Frann Rider in Treatment: 0 Subjective Chief Complaint Information obtained from Patient Patient presents to the wound care center for a consult due non healing wound to the right lateral malleolus which she's had for 2 years History of Present Illness (HPI) The following HPI elements were documented for the patient's wound: Location: Patient presents with an ulcer on the right lateral malleolus Quality: Patient reports experiencing a dull pain to affected  area(s). Severity: Patient states wound are getting worse. Duration: Patient has had the wound for >2 years prior to seeking treatment at the wound center Timing: Pain in wound is constant (hurts all the time) Context: The wound would happen gradually Modifying Factors: Other treatment(s) tried include:seen a podiatrist on a vascular surgeons and under treatment with them Associated Signs and Symptoms: Patient reports having increase swelling. 83 year old patient who most recently has been seeing both podiatry and vascular surgery for a long- standing ulcer of her right lateral malleolus which has been treated with various methodologies. Dr. Amalia Hailey the podiatrist saw her on 07/20/2017 and sent her to the wound center for possible hyperbaric oxygen therapy. past medical history of peripheral vascular disease, varicose veins, status post appendectomy, basal cell carcinoma excision from the left leg, cholecystectomy, pacemaker placement, right lower extremity angiography done by Dr. dew in March 2017 with placement of a stent. there is also note of a successful ablation of the right small saphenous vein done which was reviewed by ultrasound on 10/24/2016. the patient had a right small saphenous vein ablation done on 10/20/2016. The patient has never been a smoker. She has been seen by Dr. Corene Cornea dew the vascular surgeon who most recently saw her on 06/15/2017 for evaluation of ongoing problems with right leg swelling. She had  a lower extremity arterial duplex examination done(02/13/17) which showed patent distal right superficial femoral artery stent and above-the- knee popliteal stent without evidence of restenosis. The ABI was more than 1.3 on the right and more than 1.3 on the left. This was consistent with noncompressible arteries due to medial calcification. The right great toe pressure and PPG waveforms are within normal limits and the left great toe pressure and PPG waveforms are  decreased. he recommended she continue to wear her compression stockings and continue with elevation. She is scheduled to have a noninvasive arterial study in the near future FRANCA, STAKES ([id number]) Wound History Patient presents with 1 open wound that has been present for approximately 2 yrs. Patient has been treating wound in the following manner: mupirocin, soaking in epsom salt. Laboratory tests have not been performed in the last month. Patient reportedly has not tested positive for an antibiotic resistant organism. Patient reportedly has not tested positive for osteomyelitis. Patient reportedly has had testing performed to evaluate circulation in the legs. Patient History Information obtained from Patient. Allergies sulfa, metronidazole, monistat Family History Cancer - Father, Siblings, Heart Disease - Siblings, No family history of Diabetes, Hereditary Spherocytosis, Hypertension, Kidney Disease, Lung Disease, Seizures, Stroke, Thyroid Problems, Tuberculosis. Social History Never smoker, Marital Status - Married, Alcohol Use - Never, Drug Use - No History, Caffeine Use - Rarely. Medical History Eyes Patient has history of Cataracts - surgery Cardiovascular Patient has history of Congestive Heart Failure, Hypertension Musculoskeletal Patient has history of Osteoarthritis Neurologic Patient has history of Neuropathy Oncologic Denies history of Received Chemotherapy, Received Radiation Review of Systems (ROS) Constitutional Symptoms (General Health) The patient has no complaints or symptoms. Ear/Nose/Mouth/Throat The patient has no complaints or symptoms. Hematologic/Lymphatic The patient has no complaints or symptoms. Respiratory collapsed lung Gastrointestinal The patient has no complaints or symptoms. Endocrine Complains or has symptoms of Thyroid disease. Genitourinary The patient has no complaints or symptoms. Immunological The patient has no  complaints or symptoms. RENESHIA, ZUCCARO ([id number]) Integumentary (Skin) The patient has no complaints or symptoms. Oncologic skin Psychiatric The patient has no complaints or symptoms. Medications acetaminophen ER 650 mg tablet,extended release oral tablet extended release oral Ultram 50 mg tablet oral tablet oral Crestor 5 mg tablet oral tablet oral losartan 50 mg tablet oral tablet oral Glucosamine 500 mg tablet oral tablet oral PreserVision Lutein 226 mg-200 unit-5 mg-0.8 mg capsule oral capsule oral Tamiflu 75 mg capsule oral capsule oral Lopressor 50 mg tablet oral tablet oral Cartia XT 300 mg capsule,extended release oral capsule,extended release 24hr oral calcium carbonate-vitamin D3 600 mg (1,500 mg)-500 unit capsule oral capsule oral furosemide 40 mg tablet oral tablet oral magnesium oxide 400 mg tablet oral tablet oral Multiple Vitamin-Minerals tablet oral tablet oral Flonase Allergy Relief 50 mcg/actuation nasal spray,suspension nasal spray,suspension nasal aspirin 81 mg tablet,delayed release oral tablet,delayed release (DR/EC) oral potassium chloride ER 20 mEq tablet,extended release oral tablet extended release oral omeprazole 20 mg capsule,delayed release oral capsule,delayed release(DR/EC) oral Pradaxa 150 mg capsule oral capsule oral levothyroxine 50 mcg tablet oral tablet oral mupirocin 2 % topical ointment topical ointment topical Detrol 2 mg tablet oral tablet oral biotin 1,000 mcg chewable tablet oral tablet,chewable oral cholecalciferol (vitamin D3) 2,000 unit capsule oral capsule oral Santyl 250 unit/gram topical ointment topical ointment topical as directed Objective Constitutional Pulse regular. Respirations normal and unlabored. Afebrile. Vitals Time Taken: 10:27 AM, Height: 65 in, Source: Stated, Weight: 154.3 lbs, Source: Measured, BMI: 25.7, Temperature: 98.2 F, Pulse: 65  bpm, Respiratory Rate: 18 breaths/min, Blood Pressure: 125/61 Guyett,  Philana G. ([id number]) mmHg. Eyes Nonicteric. Reactive to light. Ears, Nose, Mouth, and Throat Lips, teeth, and gums WNL.Marland Kitchen Moist mucosa without lesions. Neck supple and nontender. No palpable supraclavicular or cervical adenopathy. Normal sized without goiter. Respiratory WNL. No retractions.. Cardiovascular Pedal Pulses WNL. March 2018 ABIs noted and she has another upcoming ABI to be done soon. she has stage to lymphedema of the right lower extremity which is more than the left lower extremity. Chest Breasts symmetical and no nipple discharge.. Breast tissue WNL, no masses, lumps, or tenderness.. Gastrointestinal (GI) Abdomen without masses or tenderness.. No liver or spleen enlargement or tenderness.. Lymphatic No adneopathy. No adenopathy. No adenopathy. Musculoskeletal Adexa without tenderness or enlargement.. Digits and nails w/o clubbing, cyanosis, infection, petechiae, ischemia, or inflammatory conditions.Marland Kitchen Psychiatric Judgement and insight Intact.. No evidence of depression, anxiety, or agitation.. General Notes: the patient has a open ulcer on the right lateral ankle which is covered with a lot of slough and after sharply debriding this with a #3 curet this is fairly clean and does not probe down to bone Integumentary (Hair, Skin) No suspicious lesions. No crepitus or fluctuance. No peri-wound warmth or erythema. No masses.. Wound #1 status is Open. Original cause of wound was Gradually Appeared. The wound is located on the Right,Lateral Malleolus. The wound measures 1.5cm length x 1.3cm width x 0.3cm depth; 1.532cm^2 area and 0.459cm^3 volume. There is no tunneling or undermining noted. There is a large amount of serous drainage noted. The wound margin is distinct with the outline attached to the wound base. There is no granulation within the wound bed. There is a large (67-100%) amount of necrotic tissue within the wound bed including Eschar and Adherent Slough. The  periwound skin appearance exhibited: Maceration, Erythema. The surrounding wound skin color is noted with erythema which is circumferential. Periwound temperature was noted as No Abnormality. The periwound has tenderness on palpation. CHINARA, HERTZBERG ([id number]) Assessment Active Problems ICD-10 [phone] - 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d I70.233 - Atherosclerosis of native arteries of right leg with ulceration of ankle I89.0 - Lymphedema, not elsewhere classified 83 year old patient who has a long-standing ulceration of the right lateral ankle, has mixed arterial and venous disease which has been treated appropriately by her vascular surgeon Dr. Corene Cornea dew. Podiatry has seen her for a long while and she is now here for an opinion regarding long-standing ulceration. After review and thorough study of her workup and investigations I have recommended: 1. Santyl ointment locally to be applied with a bordered foam after washing with soap and water 2. Continue to wear her compression stockings as before 3. elevation and exercise discussed with her in great detail 4. X-ray of the right ankle 5. Arterial duplex studies pending as per Dr. dew 6. Regular visits to the wound center She and her daughter who is at the bedside have added QUESTIONS answered to their satisfaction Procedures Wound #1 Pre-procedure diagnosis of Wound #1 is a To be determined located on the Right,Lateral Malleolus . There was a Skin/Subcutaneous Tissue Debridement ([id number]) debridement with total area of 1.95 sq cm performed by Christin Fudge, MD. with the following instrument(s): Curette to remove Viable and Non-Viable tissue/material including Exudate, Fibrin/Slough, and Subcutaneous after achieving pain control using Lidocaine 4% Topical Solution. A time out was conducted at 11:13, prior to the start of the procedure. A Minimum amount of bleeding was controlled with Pressure. The  procedure  was tolerated well with a pain level of 0 throughout and a pain level of 0 following the procedure. Post Debridement Measurements: 1.5cm length x 1.3cm width x 0.4cm depth; 0.613cm^3 volume. Character of Wound/Ulcer Post Debridement requires further debridement. Post procedure Diagnosis Wound #1: Same as Pre-Procedure Jelinek, [name] ([id number]) Plan Wound Cleansing: Wound #1 Right,Lateral Malleolus: Clean wound with Normal Saline. Cleanse wound with mild soap and water May Shower, gently pat wound dry prior to applying new dressing. Anesthetic: Wound #1 Right,Lateral Malleolus: Topical Lidocaine 4% cream applied to wound bed prior to debridement - for clinic use Skin Barriers/Peri-Wound Care: Wound #1 Right,Lateral Malleolus: Skin Prep Primary Wound Dressing: Wound #1 Right,Lateral Malleolus: Santyl Ointment Secondary Dressing: Wound #1 Right,Lateral Malleolus: Dry Gauze Boardered Foam Dressing Dressing Change Frequency: Wound #1 Right,Lateral Malleolus: Change dressing every day. Follow-up Appointments: Wound #1 Right,Lateral Malleolus: Return Appointment in 1 week. Edema Control: Wound #1 Right,Lateral Malleolus: Elevate legs to the level of the heart and pump ankles as often as possible Other: - wear your compression stockings Off-Loading: Wound #1 Right,Lateral Malleolus: Turn and reposition every 2 hours Other: - do not put pressure on your right ankle Additional Orders / Instructions: Wound #1 Right,Lateral Malleolus: Increase protein intake. Other: - Add Vitamin C, Zinc, Vitamin A to your diet The following medication(s) was prescribed: Santyl topical 250 unit/gram ointment ointment topical as directed starting 08/10/2017 Tiamarie, Furnari [name] ([id number]) 83 year old patient who has a long-standing ulceration of the right lateral ankle, has mixed arterial and venous disease which has been treated appropriately by her vascular surgeon Dr. Corene Cornea dew.  Podiatry has seen her for a long while and she is now here for an opinion regarding long-standing ulceration. After review and thorough study of her workup and investigations I have recommended: 1. Santyl ointment locally to be applied with a bordered foam after washing with soap and water 2. Continue to wear her compression stockings as before 3. elevation and exercise discussed with her in great detail 4. X-ray of the right ankle 5. Arterial duplex studies pending as per Dr. dew 6. Regular visits to the wound center She and her daughter who is at the bedside have added QUESTIONS answered to their satisfaction Electronic Signature(s) Signed: 08/10/2017 3:48:14 PM By: Christin Fudge MD, FACS Entered By: Christin Fudge on 08/10/2017 12:12:59 [name] ([id number]) -------------------------------------------------------------------------------- ROS/PFSH Details Patient Name: [name] Date of Service: 08/10/2017 10:30 AM Medical Record Number: [id number] Patient Account Number: [id number] Date of Birth/Sex: Jul 09, 1923 (83 y.o. Female) Treating RN: Ahmed Prima Primary Care Provider: FITZGERALD, DAVID Other Clinician: Referring Provider: Daylene Katayama Treating Provider/Extender: Frann Rider in Treatment: 0 Information Obtained From Patient Wound History Do you currently have one or more open woundso Yes How many open wounds do you currently haveo 1 Approximately how long have you had your woundso 2 yrs How have you been treating your wound(s) until nowo mupirocin, soaking in epsom salt Has your wound(s) ever healed and then re-openedo No Have you had any lab work done in the past montho No Have you tested positive for an antibiotic resistant organism (MRSA, No VRE)o Have you tested positive for osteomyelitis (bone infection)o No Have you had any tests for circulation on your legso Yes Who ordered the testo Dr. Lucky Cowboy Where was the test doneo  [facility] Endocrine Complaints and Symptoms: Positive for: Thyroid disease Constitutional Symptoms (General Health) Complaints and Symptoms: No Complaints or Symptoms Eyes Medical History: Positive for: Cataracts - surgery Ear/Nose/Mouth/Throat Complaints and Symptoms:  No Complaints or Symptoms Hematologic/Lymphatic Complaints and Symptoms: No Complaints or Symptoms Vinciguerra, [name] ([id number]) Respiratory Complaints and Symptoms: Review of System Notes: collapsed lung Cardiovascular Medical History: Positive for: Congestive Heart Failure; Hypertension Gastrointestinal Complaints and Symptoms: No Complaints or Symptoms Genitourinary Complaints and Symptoms: No Complaints or Symptoms Immunological Complaints and Symptoms: No Complaints or Symptoms Integumentary (Skin) Complaints and Symptoms: No Complaints or Symptoms Musculoskeletal Medical History: Positive for: Osteoarthritis Neurologic Medical History: Positive for: Neuropathy Oncologic Complaints and Symptoms: Review of System Notes: skin Medical History: Negative for: Received Chemotherapy; Received Radiation Psychiatric SHUREE, BROSSART ([id number]) Complaints and Symptoms: No Complaints or Symptoms HBO Extended History Items Eyes: Cataracts Immunizations Pneumococcal Vaccine: Received Pneumococcal Vaccination: Yes Family and Social History Cancer: Yes - Father, Siblings; Diabetes: No; Heart Disease: Yes - Siblings; Hereditary Spherocytosis: No; Hypertension: No; Kidney Disease: No; Lung Disease: No; Seizures: No; Stroke: No; Thyroid Problems: No; Tuberculosis: No; Never smoker; Marital Status - Married; Alcohol Use: Never; Drug Use: No History; Caffeine Use: Rarely; Financial Concerns: No; Food, Clothing or Shelter Needs: No; Support System Lacking: No; Transportation Concerns: No; Advanced Directives: No; Patient does not want information on Advanced Directives; Do not resuscitate: No; Living  Will: Yes (Not Provided); Medical Power of Attorney: No Physician Affirmation I have reviewed and agree with the above information. Electronic Signature(s) Signed: 08/10/2017 3:48:14 PM By: Christin Fudge MD, FACS Signed: 08/10/2017 4:50:20 PM By: Alric Quan Entered By: Christin Fudge on 08/10/2017 11:56:33 Nickelson, [name] ([id number]) -------------------------------------------------------------------------------- SuperBill Details Patient Name: [name] Date of Service: 08/10/2017 Medical Record Number: [id number] Patient Account Number: [id number] Date of Birth/Sex: October 06, 1923 (83 y.o. 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 Atherosclerosis of native arteries of right leg with ulceration of ankle I89.0 Lymphedema, not elsewhere classified Facility Procedures CPT4 Code Description: 56979480 99213 - WOUND CARE VISIT-LEV 3 EST PT Modifier: Quantity: 1 CPT4 Code Description: 16553748 11042 - DEB SUBQ TISSUE 20 SQ CM/< ICD-10 Description Diagnosis L97.312 Non-pressure chronic ulcer of right ankle with fat lay I70.233 Atherosclerosis of native arteries of right leg with u I89.0 Lymphedema, not elsewhere  classified Modifier: er exposed lceration of Quantity: 1 ankle Physician Procedures CPT4 Code Description: 2707867 54492 - WC PHYS LEVEL 4 - NEW PT ICD-10 Description Diagnosis L97.312 Non-pressure chronic ulcer of right ankle with fat la I70.233 Atherosclerosis of native arteries of right leg with I89.0 Lymphedema, not elsewhere  classified Modifier: yer exposed ulceration of a Quantity: 1 nkle CPT4 Code Description: 0100712 11042 - WC PHYS SUBQ TISS 20 SQ CM ICD-10 Description Diagnosis  L97.312 Non-pressure chronic ulcer of right ankle with fat la I70.233 Atherosclerosis of native arteries of right leg with I89.0 Lymphedema, not elsewhere  classified Sheriann, Newmann [name] ([id number]) Modifier: yer exposed ulceration of a Quantity: 1 nkle Electronic Signature(s) Signed: 08/10/2017 3:48:14 PM By: Christin Fudge MD, FACS Signed: 08/10/2017 4:50:20 PM By: Alric Quan Entered By: Alric Quan on 08/10/2017 13:32:50

## 2017-08-13 NOTE — Progress Notes (Signed)
[name], [name] ([id number]) Visit Report for 08/10/2017 Allergy List Details Patient Name: [name], [name]. Date of Service: 08/10/2017 10:30 AM Medical Record Number: [id number] Patient Account Number: [id number] Date of Birth/Sex: 11-09-1923 (83 y.o. Female) Treating RN: Ahmed Prima Primary Care Azrael Maddix: FITZGERALD, DAVID Other Clinician: Referring Yardley Beltran: Daylene Katayama Treating Anyla Israelson/Extender: Frann Rider in Treatment: 0 Allergies Active Allergies sulfa metronidazole monistat Allergy Notes Electronic Signature(s) Signed: 08/10/2017 4:50:20 PM By: Alric Quan Entered By: Alric Quan on 08/10/2017 10:29:16 Hayward, [name] ([id number]) -------------------------------------------------------------------------------- Arrival Information Details Patient Name: [name] Date of Service: 08/10/2017 10:30 AM Medical Record Number: [id number] Patient Account Number: [id number] Date of Birth/Sex: 03/20/1923 (83 y.o. Female) Treating RN: Ahmed Prima Primary Care Gordon Vandunk: FITZGERALD, DAVID Other Clinician: Referring Allyssia Skluzacek: Daylene Katayama Treating Chaselynn Kepple/Extender: Frann Rider in Treatment: 0 Visit Information Patient Arrived: Ambulatory Arrival Time: 10:24 Accompanied By: daughter Transfer Assistance: None Patient Identification Verified: Yes Secondary Verification Process Yes Completed: Patient Requires Transmission-Based No Precautions: Patient Has Alerts: No Electronic Signature(s) Signed: 08/10/2017 4:50:20 PM By: Alric Quan Entered By: Alric Quan on 08/10/2017 10:27:11 Cassin, [name] ([id number]) -------------------------------------------------------------------------------- Clinic Level of Care Assessment Details Patient Name: [name] Date of Service: 08/10/2017 10:30 AM Medical Record Number: [id number] Patient Account Number: [id number] Date of Birth/Sex: July 07, 1923 (83 y.o. Female) Treating  RN: Ahmed Prima Primary Care Deundre Thong: FITZGERALD, DAVID Other Clinician: Referring Idaly Verret: Daylene Katayama Treating Peyson Delao/Extender: Frann Rider in Treatment: 0 Clinic Level of Care Assessment Items TOOL 1 Quantity Score X - Use when EandM and Procedure is performed on INITIAL visit 1 0 ASSESSMENTS - Nursing Assessment / Reassessment X - General Physical Exam (combine w/ comprehensive assessment (listed just 1 20 below) when performed on new pt. evals) X - Comprehensive Assessment (HX, ROS, Risk Assessments, Wounds Hx, etc.) 1 25 ASSESSMENTS - Wound and Skin Assessment / Reassessment []  - Dermatologic / Skin Assessment (not related to wound area) 0 ASSESSMENTS - Ostomy and/or Continence Assessment and Care []  - Incontinence Assessment and Management 0 []  - Ostomy Care Assessment and Management (repouching, etc.) 0 PROCESS - Coordination of Care X - Simple Patient / Family Education for ongoing care 1 15 []  - Complex (extensive) Patient / Family Education for ongoing care 0 []  - Staff obtains Programmer, systems, Records, Test Results / Process Orders 0 []  - Staff telephones HHA, Nursing Homes / Clarify orders / etc 0 []  - Routine Transfer to another Facility (non-emergent condition) 0 []  - Routine Hospital Admission (non-emergent condition) 0 X - New Admissions / Biomedical engineer / Ordering NPWT, Apligraf, etc. 1 15 []  - Emergency Hospital Admission (emergent condition) 0 PROCESS - Special Needs []  - Pediatric / Minor Patient Management 0 []  - Isolation Patient Management 0 Jeffcoat, [name] ([id number]) []  - Hearing / Language / Visual special needs 0 []  - Assessment of Community assistance (transportation, D/C planning, etc.) 0 []  - Additional assistance / Altered mentation 0 []  - Support Surface(s) Assessment (bed, cushion, seat, etc.) 0 INTERVENTIONS - Miscellaneous []  - External ear exam 0 []  - Patient Transfer (multiple staff / Civil Service fast streamer / Similar devices)  0 []  - Simple Staple / Suture removal (25 or less) 0 []  - Complex Staple / Suture removal (26 or more) 0 []  - Hypo/Hyperglycemic Management (do not check if billed separately) 0 X - Ankle / Brachial Index (ABI) - do not check if billed separately 1 15 Has the patient been seen at the hospital within the last three years: Yes Total  Score: 90 Level Of Care: New/Established - Level 3 Electronic Signature(s) Signed: 08/10/2017 4:50:20 PM By: Alric Quan Entered By: Alric Quan on 08/10/2017 13:32:44 Kong, [name] ([id number]) -------------------------------------------------------------------------------- Encounter Discharge Information Details Patient Name: [name] Date of Service: 08/10/2017 10:30 AM Medical Record Number: [id number] Patient Account Number: [id number] Date of Birth/Sex: [birth year] (83 y.o. Female) Treating RN: Ahmed Prima Primary Care Rahmah Mccamy: FITZGERALD, DAVID Other Clinician: Referring Tonio Seider: Daylene Katayama Treating Nadalie Laughner/Extender: Frann Rider in Treatment: 0 Encounter Discharge Information Items Discharge Pain Level: 0 Discharge Condition: Stable Ambulatory Status: Ambulatory Discharge Destination: Home Transportation: Private Auto Accompanied By: daughter Schedule Follow-up Appointment: Yes Medication Reconciliation completed and provided to Patient/Care No Razan Siler: Provided on Clinical Summary of Care: 08/10/2017 Form Type Recipient Paper Patient WS Electronic Signature(s) Signed: 08/13/2017 10:12:15 AM By: Ruthine Dose Entered By: Ruthine Dose on 08/10/2017 11:36:01 Sider, [name] ([id number]) -------------------------------------------------------------------------------- Lower Extremity Assessment Details Patient Name: [name] Date of Service: 08/10/2017 10:30 AM Medical Record Number: [id number] Patient Account Number: [id number] Date of Birth/Sex: Sep 24, 1923 (83 y.o. Female) Treating RN:  Ahmed Prima Primary Care Laurence Crofford: FITZGERALD, DAVID Other Clinician: Referring Buck Mcaffee: Daylene Katayama Treating Beyla Loney/Extender: Frann Rider in Treatment: 0 Edema Assessment Assessed: [Left: No] [Right: No] Edema: [Left: No] [Right: Yes] Calf Left: Right: Point of Measurement: 36 cm From Medial Instep 30 cm 32 cm Ankle Left: Right: Point of Measurement: 11 cm From Medial Instep 17.6 cm 22.5 cm Vascular Assessment Pulses: Dorsalis Pedis Palpable: [Left:No] [Right:No] Doppler Audible: [Left:Yes] [Right:Yes] Posterior Tibial Extremity colors, hair growth, and conditions: Extremity Color: [Left:Hyperpigmented] [Right:Hyperpigmented] Hair Growth on Extremity: [Right:Yes] Temperature of Extremity: [Left:Warm] [Right:Warm] Capillary Refill: [Left:< 3 seconds] [Right:< 3 seconds] Toe Nail Assessment Left: Right: Thick: Yes Yes Discolored: Yes Yes Deformed: No Yes Improper Length and Hygiene: No Yes Electronic Signature(s) Signed: 08/10/2017 4:50:20 PM By: Alric Quan Entered By: Alric Quan on 08/10/2017 10:52:16 Hagins, [name] ([id number]) Joni Fears, [name] ([id number]) -------------------------------------------------------------------------------- Multi Wound Chart Details Patient Name: [name] Date of Service: 08/10/2017 10:30 AM Medical Record Number: [id number] Patient Account Number: [id number] Date of Birth/Sex: August 27, 1923 (83 y.o. Female) Treating RN: Ahmed Prima Primary Care Piedad Standiford: FITZGERALD, DAVID Other Clinician: Referring Anjolina Byrer: Daylene Katayama Treating Guthrie Lemme/Extender: Frann Rider in Treatment: 0 Vital Signs Height(in): 65 Pulse(bpm): 65 Weight(lbs): 154.3 Blood Pressure 125/61 (mmHg): Body Mass Index(BMI): 26 Temperature(F): 98.2 Respiratory Rate 18 (breaths/min): Photos: [1:No Photos] [N/A:N/A] Wound Location: [1:Right Malleolus - Lateral N/A] Wounding Event: [1:Gradually Appeared]  [N/A:N/A] Primary Etiology: [1:To be determined] [N/A:N/A] Comorbid History: [1:Cataracts, Congestive Heart Failure, Hypertension, Osteoarthritis, Neuropathy] [N/A:N/A] Date Acquired: [1:08/11/2015] [N/A:N/A] Weeks of Treatment: [1:0] [N/A:N/A] Wound Status: [1:Open] [N/A:N/A] Measurements L x W x D 1.5x1.3x0.3 [N/A:N/A] (cm) Area (cm) : [1:1.532] [N/A:N/A] Volume (cm) : [1:0.459] [N/A:N/A] Classification: [1:Partial Thickness] [N/A:N/A] Exudate Amount: [1:Large] [N/A:N/A] Exudate Type: [1:Serous] [N/A:N/A] Exudate Color: [1:amber] [N/A:N/A] Wound Margin: [1:Distinct, outline attached N/A] Granulation Amount: [1:None Present (0%)] [N/A:N/A] Necrotic Amount: [1:Large (67-100%)] [N/A:N/A] Necrotic Tissue: [1:Eschar, Adherent Slough N/A] Epithelialization: [1:None] [N/A:N/A] Debridement: [1:Debridement ([id number])] [N/A:N/A] Pre-procedure [1:11:13] [N/A:N/A] Verification/Time Out Taken: Pain Control: [N/A:N/A] Lidocaine 4% Topical Solution Tissue Debrided: Fibrin/Slough, Exudates, N/A N/A Subcutaneous Level: Skin/Subcutaneous N/A N/A Tissue Debridement Area (sq 1.95 N/A N/A cm): Instrument: Curette N/A N/A Bleeding: Minimum N/A N/A Hemostasis Achieved: Pressure N/A N/A Procedural Pain: 0 N/A N/A Post Procedural Pain: 0 N/A N/A Debridement Treatment Procedure was tolerated N/A N/A Response: well Post Debridement 1.5x1.3x0.4 N/A N/A Measurements L x W x D (cm)  Post Debridement 0.613 N/A N/A Volume: (cm) Periwound Skin Texture: No Abnormalities Noted N/A N/A Periwound Skin Maceration: Yes N/A N/A Moisture: Periwound Skin Color: Erythema: Yes N/A N/A Erythema Location: Circumferential N/A N/A Temperature: No Abnormality N/A N/A Tenderness on Yes N/A N/A Palpation: Wound Preparation: Ulcer Cleansing: N/A N/A Rinsed/Irrigated with Saline Topical Anesthetic Applied: Other: lidocaine 4% Procedures Performed: Debridement N/A N/A Treatment Notes Wound #1 (Right,  Lateral Malleolus) 1. Cleansed with: Clean wound with Normal Saline 2. Anesthetic Topical Lidocaine 4% cream to wound bed prior to debridement 3. Peri-wound Care: Skin Prep 4. Dressing Applied: Santyl Ointment 5. Secondary Dressing Applied [name], [name] ([id number]) Bordered Foam Dressing Dry Gauze Electronic Signature(s) Signed: 08/10/2017 3:48:14 PM By: Christin Fudge MD, FACS Entered By: Christin Fudge on 08/10/2017 11:58:13 Weinfeld, [name] ([id number]) -------------------------------------------------------------------------------- [place] Details Patient Name: [name] Date of Service: 08/10/2017 10:30 AM Medical Record Number: [id number] Patient Account Number: [id number] Date of Birth/Sex: [birth year] (83 y.o. Female) Treating RN: Ahmed Prima Primary Care Odessie Polzin: [facility], DAVID Other Clinician: Referring Draya Felker: Daylene Katayama Treating Charda Janis/Extender: Frann Rider in Treatment: 0 Active Inactive ` Abuse / Safety / Falls / Self Care Management Nursing Diagnoses: Potential for falls Goals: Patient will not experience any injury related to falls Date Initiated: 08/10/2017 Target Resolution Date: 11/10/2017 Goal Status: Active Interventions: Assess Activities of Daily Living upon admission and as needed Assess fall risk on admission and as needed Assess: immobility, friction, shearing, incontinence upon admission and as needed Notes: ` Nutrition Nursing Diagnoses: Imbalanced nutrition Potential for alteratiion in Nutrition/Potential for imbalanced nutrition Goals: Patient/caregiver agrees to and verbalizes understanding of need to use nutritional supplements and/or vitamins as prescribed Date Initiated: 08/10/2017 Target Resolution Date: 12/08/2017 Goal Status: Active Interventions: Assess patient nutrition upon admission and as needed per policy Notes: ` Orientation to the Wound Care Program [name], [name]  ([id number]) Nursing Diagnoses: Knowledge deficit related to the wound healing center program Goals: Patient/caregiver will verbalize understanding of the [facility] Date Initiated: 08/10/2017 Target Resolution Date: 09/08/2017 Goal Status: Active Interventions: Provide education on orientation to the wound center Notes: ` Pain, Acute or Chronic Nursing Diagnoses: Pain, acute or chronic: actual or potential Potential alteration in comfort, pain Goals: Patient/caregiver will verbalize adequate pain control between visits Date Initiated: 08/10/2017 Target Resolution Date: 12/08/2017 Goal Status: Active Interventions: Complete pain assessment as per visit requirements Notes: ` Wound/Skin Impairment Nursing Diagnoses: Impaired tissue integrity Knowledge deficit related to ulceration/compromised skin integrity Goals: Ulcer/skin breakdown will have a volume reduction of 80% by week 12 Date Initiated: 08/10/2017 Target Resolution Date: 12/01/2017 Goal Status: Active Interventions: Assess patient/caregiver ability to perform ulcer/skin care regimen upon admission and as needed Notes: [name], [name] ([id number]) Electronic Signature(s) Signed: 08/10/2017 4:50:20 PM By: Alric Quan Entered By: Alric Quan on 08/10/2017 11:05:57 Robey, [name] ([id number]) -------------------------------------------------------------------------------- Pain Assessment Details Patient Name: [name] Date of Service: 08/10/2017 10:30 AM Medical Record Number: [id number] Patient Account Number: [id number] Date of Birth/Sex: [birth year] (83 y.o. Female) Treating RN: Ahmed Prima Primary Care Shatana Saxton: Ola Spurr, DAVID Other Clinician: Referring Sherilee Smotherman: Daylene Katayama Treating Allysia Ingles/Extender: Frann Rider in Treatment: 0 Active Problems Location of Pain Severity and Description of Pain Patient Has Paino No Site Locations Pain Management and  Medication Current Pain Management: Notes only pain at night Electronic Signature(s) Signed: 08/10/2017 4:50:20 PM By: Alric Quan Entered By: Alric Quan on 08/10/2017 10:27:41 Schunk, [name] ([id number]) -------------------------------------------------------------------------------- Patient/Caregiver Education Details Patient Name: Marlou Starks  G. Date of Service: 08/10/2017 10:30 AM Medical Record Number: [id number] Patient Account Number: [id number] Date of Birth/Gender: [birth year] (83 y.o. Female) Treating RN: Ahmed Prima Primary Care Physician: Ola Spurr, DAVID Other Clinician: Referring Physician: Daylene Katayama Treating Physician/Extender: Frann Rider in Treatment: 0 Education Assessment Education Provided To: Patient Education Topics Provided Welcome To [facility]: Handouts: Welcome [facility] Methods: Explain/Verbal Responses: State content correctly Wound/Skin Impairment: Handouts: Other: change dressing as ordered Methods: Demonstration, Explain/Verbal Responses: State content correctly Electronic Signature(s) Signed: 08/10/2017 4:50:20 PM By: Alric Quan Entered By: Alric Quan on 08/10/2017 11:06:52 Pickney, [name] ([id number]) -------------------------------------------------------------------------------- Wound Assessment Details Patient Name: [name] Date of Service: 08/10/2017 10:30 AM Medical Record Number: [id number] Patient Account Number: [id number] Date of Birth/Sex: 1923/01/18 (83 y.o. Female) Treating RN: Ahmed Prima Primary Care Melanie Pellot: FITZGERALD, DAVID Other Clinician: Referring Rudolpho Claxton: Daylene Katayama Treating Braidyn Scorsone/Extender: Frann Rider in Treatment: 0 Wound Status Wound Number: 1 Primary To be determined Etiology: Wound Location: Right Malleolus - Lateral Wound Open Wounding Event: Gradually Appeared Status: Date Acquired: 08/11/2015 Comorbid  Cataracts, Congestive Heart Failure, Weeks Of Treatment: 0 History: Hypertension, Osteoarthritis, Clustered Wound: No Neuropathy Photos Photo Uploaded By: Alric Quan on 08/10/2017 15:25:48 Wound Measurements Length: (cm) 1.5 Width: (cm) 1.3 Depth: (cm) 0.3 Area: (cm) 1.532 Volume: (cm) 0.459 % Reduction in Area: % Reduction in Volume: Epithelialization: None Tunneling: No Undermining: No Wound Description Classification: Partial Thickness Wound Margin: Distinct, outline attached Exudate Amount: Large Exudate Type: Serous Exudate Color: amber Foul Odor After Cleansing: No Slough/Fibrino Yes Wound Bed Granulation Amount: None Present (0%) Necrotic Amount: Large (67-100%) Necrotic Quality: Eschar, Adherent Slough Billet, [name] ([id number]) Periwound Skin Texture Texture Color No Abnormalities Noted: No No Abnormalities Noted: No Erythema: Yes Moisture Erythema Location: Circumferential No Abnormalities Noted: No Maceration: Yes Temperature / Pain Temperature: No Abnormality Tenderness on Palpation: Yes Wound Preparation Ulcer Cleansing: Rinsed/Irrigated with Saline Topical Anesthetic Applied: Other: lidocaine 4%, Treatment Notes Wound #1 (Right, Lateral Malleolus) 1. Cleansed with: Clean wound with Normal Saline 2. Anesthetic Topical Lidocaine 4% cream to wound bed prior to debridement 3. Peri-wound Care: Skin Prep 4. Dressing Applied: Santyl Ointment 5. Secondary Dressing Applied Bordered Foam Dressing Dry Gauze Electronic Signature(s) Signed: 08/10/2017 4:50:20 PM By: Alric Quan Entered By: Alric Quan on 08/10/2017 10:56:57 Manera, [name] ([id number]) -------------------------------------------------------------------------------- [place] Details Patient Name: [name] Date of Service: 08/10/2017 10:30 AM Medical Record Number: [id number] Patient Account Number: [id number] Date of Birth/Sex: December 21, 1922 (83 y.o.  Female) Treating RN: Ahmed Prima Primary Care Isaah Furry: FITZGERALD, DAVID Other Clinician: Referring Yarlin Breisch: Daylene Katayama Treating Reesa Gotschall/Extender: Frann Rider in Treatment: 0 Vital Signs Time Taken: 10:27 Temperature (F): 98.2 Height (in): 65 Pulse (bpm): 65 Source: Stated Respiratory Rate (breaths/min): 18 Weight (lbs): 154.3 Blood Pressure (mmHg): 125/61 Source: Measured Reference Range: 80 - 120 mg / dl Body Mass Index (BMI): 25.7 Electronic Signature(s) Signed: 08/10/2017 4:50:20 PM By: Alric Quan Entered By: Alric Quan on 08/10/2017 10:41:12

## 2017-08-16 ENCOUNTER — Ambulatory Visit
Admission: RE | Admit: 2017-08-16 | Discharge: 2017-08-16 | Disposition: A | Discharge status: Home / Self Care | Payer: Medicare Other | Source: Ambulatory Visit | Attending: Surgery | Admitting: Surgery

## 2017-08-16 ENCOUNTER — Other Ambulatory Visit: Payer: Self-pay | Admitting: Surgery

## 2017-08-16 ENCOUNTER — Encounter: Payer: Medicare Other | Admitting: Surgery

## 2017-08-16 DIAGNOSIS — S81801A Unspecified open wound, right lower leg, initial encounter: Secondary | ICD-10-CM

## 2017-08-16 DIAGNOSIS — L97319 Non-pressure chronic ulcer of right ankle with unspecified severity: Secondary | ICD-10-CM | POA: Diagnosis present

## 2017-08-16 DIAGNOSIS — M7989 Other specified soft tissue disorders: Secondary | ICD-10-CM | POA: Insufficient documentation

## 2017-08-16 DIAGNOSIS — I70233 Atherosclerosis of native arteries of right leg with ulceration of ankle: Secondary | ICD-10-CM | POA: Diagnosis not present

## 2017-08-17 ENCOUNTER — Ambulatory Visit: Payer: Medicare Other | Admitting: Surgery

## 2017-08-17 ENCOUNTER — Encounter (INDEPENDENT_AMBULATORY_CARE_PROVIDER_SITE_OTHER): Payer: Medicare Other

## 2017-08-17 ENCOUNTER — Ambulatory Visit (INDEPENDENT_AMBULATORY_CARE_PROVIDER_SITE_OTHER): Payer: Medicare Other | Admitting: Vascular Surgery

## 2017-08-18 NOTE — Progress Notes (Signed)
[name], [name] ([id number]) Visit Report for 08/16/2017 Chief Complaint Document Details Patient Name: [name]. Date of Service: 08/16/2017 4:00 PM Medical Record Number: [id number] Patient Account Number: [id number] Date of Birth/Sex: 10-22-1923 (83 y.o. Female) Treating RN: Ahmed Prima Primary Care Provider: FITZGERALD, DAVID Other Clinician: Referring Provider: FITZGERALD, DAVID Treating Provider/Extender: Frann Rider in Treatment: 0 Information Obtained from: Patient Chief Complaint Patient presents to the wound care center for a consult due non healing wound to the right lateral malleolus which she's had for 2 years Electronic Signature(s) Signed: 08/16/2017 5:02:50 PM By: Christin Fudge MD, FACS Entered By: Christin Fudge on 08/16/2017 17:00:05 [name] ([id number]) -------------------------------------------------------------------------------- Debridement Details Patient Name: [name] Date of Service: 08/16/2017 4:00 PM Medical Record Number: [id number] Patient Account Number: [id number] Date of Birth/Sex: 04-09-1923 (83 y.o. Female) Treating RN: Ahmed Prima Primary Care Provider: FITZGERALD, DAVID Other Clinician: Referring Provider: FITZGERALD, DAVID Treating Provider/Extender: Frann Rider in Treatment: 0 Debridement Performed for Wound #1 Right,Lateral Malleolus Assessment: Performed By: Physician Christin Fudge, MD Debridement: Debridement Pre-procedure Verification/Time Out Yes - 16:18 Taken: Start Time: 16:19 Pain Control: Lidocaine 4% Topical Solution Level: Skin/Subcutaneous Tissue Total Area Debrided (L x 1.2 (cm) x 1.2 (cm) = 1.44 (cm) W): Tissue and other Viable, Non-Viable, Exudate, Fibrin/Slough, Subcutaneous material debrided: Instrument: Curette Bleeding: Minimum Hemostasis Achieved: Pressure End Time: 16:21 Procedural Pain: 0 Post Procedural Pain: 0 Response to Treatment: Procedure was  tolerated well Post Debridement Measurements of Total Wound Length: (cm) 1.2 Width: (cm) 1.2 Depth: (cm) 0.3 Volume: (cm) 0.339 Character of Wound/Ulcer Post Requires Further Debridement Debridement: Post Procedure Diagnosis Same as Pre-procedure Electronic Signature(s) Signed: 08/16/2017 5:02:50 PM By: Christin Fudge MD, FACS Signed: 08/16/2017 5:37:07 PM By: Alric Quan Entered By: Christin Fudge on 08/16/2017 17:00:00 Well, [name] ([id number]) -------------------------------------------------------------------------------- HPI Details Patient Name: [name] Date of Service: 08/16/2017 4:00 PM Medical Record Number: [id number] Patient Account Number: [id number] Date of Birth/Sex: June 02, 1923 (83 y.o. Female) Treating RN: Ahmed Prima Primary Care Provider: FITZGERALD, DAVID Other Clinician: Referring Provider: FITZGERALD, DAVID Treating Provider/Extender: Frann Rider in Treatment: 0 History of Present Illness Location: Patient presents with an ulcer on the right lateral malleolus Quality: Patient reports experiencing a dull pain to affected area(s). Severity: Patient states wound are getting worse. Duration: Patient has had the wound for >2 years prior to seeking treatment at the wound center Timing: Pain in wound is constant (hurts all the time) Context: The wound would happen gradually Modifying Factors: Other treatment(s) tried include:seen a podiatrist on a vascular surgeons and under treatment with them Associated Signs and Symptoms: Patient reports having increase swelling. 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 Dr. Amalia Hailey the podiatrist saw her on 07/20/2017 and sent her to the wound center for possible hyperbaric oxygen therapy. past medical history of peripheral  vascular disease, varicose veins, status post appendectomy, basal cell carcinoma excision from the left leg, cholecystectomy, pacemaker placement, right lower extremity angiography done by Dr. dew in March 2017 with placement of a stent. there is also note of a successful ablation of the right small saphenous vein done which was reviewed by ultrasound on 10/24/2016. the patient had a right small saphenous vein ablation done on 10/20/2016. The patient has never been a smoker. She has been seen by Dr. Corene Cornea dew the vascular surgeon who most recently  saw her on 06/15/2017 for evaluation of ongoing problems with right leg swelling. She had a lower extremity arterial duplex examination done(02/13/17) which showed patent distal right superficial femoral artery stent and above-the- knee popliteal stent without evidence of restenosis. The ABI was more than 1.3 on the right and more than 1.3 on the left. This was consistent with noncompressible arteries due to medial calcification. The right great toe pressure and PPG waveforms are within normal limits and the left great toe pressure and PPG waveforms are decreased. he recommended she continue to wear her compression stockings and continue with elevation. She is scheduled to have a noninvasive arterial study in the near future 08/16/2017 -- had a lower extremity arterial duplex examination done which showed patent distal right superficial femoral artery stent and above-the-knee popliteal stent without evidence of restenosis. The ABI was more than 1.3 on the right and more than 1.3 on the left. This was consistent with noncompressible arteries due to medial calcification. The right great toe pressure and PPG waveforms are within normal limits and the left great toe pressure and PPG waveforms are decreased. the x-ray of the right ankle has not yet been done Electronic Signature(s) [facility], [facility] ([id number]) Signed: 08/16/2017 5:02:50 PM By: Christin Fudge MD, FACS Entered By: Christin Fudge on 08/16/2017 17:00:17 Joni Fears [name] ([id number]) -------------------------------------------------------------------------------- Physical Exam Details Patient Name: [name] Date of Service: 08/16/2017 4:00 PM Medical Record Number: [id number] Patient Account Number: [id number] Date of Birth/Sex: 1923-08-13 (83 y.o. Female) Treating RN: Ahmed Prima Primary Care Provider: FITZGERALD, DAVID Other Clinician: Referring Provider: FITZGERALD, DAVID Treating Provider/Extender: Christin Fudge Weeks in Treatment: 0 Constitutional . Pulse regular. Respirations normal and unlabored. Afebrile. . Eyes Nonicteric. Reactive to light. Ears, Nose, Mouth, and Throat Lips, teeth, and gums WNL.Marland Kitchen Moist mucosa without lesions. Neck supple and nontender. No palpable supraclavicular or cervical adenopathy. Normal sized without goiter. Respiratory WNL. No retractions.. Cardiovascular Pedal Pulses WNL. No clubbing, cyanosis or edema. Lymphatic No adneopathy. No adenopathy. No adenopathy. Musculoskeletal Adexa without tenderness or enlargement.. Digits and nails w/o clubbing, cyanosis, infection, petechiae, ischemia, or inflammatory conditions.. Integumentary (Hair, Skin) No suspicious lesions. No crepitus or fluctuance. No peri-wound warmth or erythema. No masses.Marland Kitchen Psychiatric Judgement and insight Intact.. No evidence of depression, anxiety, or agitation.. Notes the wound on the right lateral ankle over the malleolus has a lot of slough and I sharply removed it with a #3 curet and minimal bleeding controlled with pressure does not probe down to bone. Electronic Signature(s) Signed: 08/16/2017 5:02:50 PM By: Christin Fudge MD, FACS Entered By: Christin Fudge on 08/16/2017 17:00:44 Clopper, [name] ([id number]) -------------------------------------------------------------------------------- Physician Orders Details Patient Name: [name] Date of Service: 08/16/2017 4:00 PM Medical Record Number: [id number] Patient Account Number: [id number] Date of Birth/Sex: Apr 15, 1923 (83 y.o. Female) Treating RN: Ahmed Prima Primary Care Provider: FITZGERALD, DAVID Other Clinician: Referring Provider: FITZGERALD, DAVID Treating Provider/Extender: Frann Rider in Treatment: 0 Verbal / Phone Orders: No Diagnosis Coding Wound Cleansing Wound #1 Right,Lateral Malleolus o Clean wound with Normal Saline. o Cleanse wound with mild soap and water o May Shower, gently pat wound dry prior to applying new dressing. Anesthetic Wound #1 Right,Lateral Malleolus o Topical Lidocaine 4% cream applied to wound bed prior to debridement - for clinic use Skin Barriers/Peri-Wound Care Wound #1 Right,Lateral Malleolus o Skin Prep Primary Wound Dressing Wound #1 Right,Lateral Malleolus o Santyl Ointment Secondary Dressing Wound #1 Right,Lateral Malleolus o Dry Gauze o Boardered Foam Dressing Dressing Change  Frequency Wound #1 Right,Lateral Malleolus o Change dressing every day. Follow-up Appointments Wound #1 Right,Lateral Malleolus o Return Appointment in 1 week. Edema Control Wound #1 Right,Lateral Malleolus o Elevate legs to the level of the heart and pump ankles as often as possible o Other: - wear your compression stockings Fennelly, [name] ([id number]) Off-Loading Wound #1 Right,Lateral Malleolus o Turn and reposition every 2 hours o Other: - do not put pressure on your right ankle Additional Orders / Instructions Wound #1 Right,Lateral Malleolus o Increase protein intake. o Other: - Add Vitamin C, Zinc, Vitamin A to your diet Electronic Signature(s) Signed: 08/16/2017 5:02:50 PM By: Christin Fudge MD, FACS Signed: 08/16/2017 5:37:07 PM By: Alric Quan Entered By: Alric Quan on 08/16/2017 16:20:48 Strauss, [name]  ([id number]) -------------------------------------------------------------------------------- Problem List Details Patient Name: [name] Date of Service: 08/16/2017 4:00 PM Medical Record Number: [id number] Patient Account Number: [id number] Date of Birth/Sex: 1923-10-16 (83 y.o. 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/10/2017 Yes exposed I70.233 Atherosclerosis of native arteries of right leg with 08/10/2017 Yes ulceration of ankle I89.0 Lymphedema, not elsewhere classified 08/10/2017 Yes Inactive Problems Resolved Problems Electronic Signature(s) Signed: 08/16/2017 5:02:50 PM By: Christin Fudge MD, FACS Entered By: Christin Fudge on 08/16/2017 16:59:49 Gallicchio, [name] ([id number]) -------------------------------------------------------------------------------- Progress Note Details Patient Name: [name] Date of Service: 08/16/2017 4:00 PM Medical Record Number: [id number] Patient Account Number: [id number] Date of Birth/Sex: [birth year] (83 y.o. Female) Treating RN: Ahmed Prima Primary Care Provider: FITZGERALD, DAVID Other Clinician: Referring Provider: FITZGERALD, DAVID Treating Provider/Extender: Frann Rider in Treatment: 0 Subjective Chief Complaint Information obtained from Patient Patient presents to the wound care center for a consult due non healing wound to the right lateral malleolus which she's had for 2 years History of Present Illness (HPI) The following HPI elements were documented for the patient's wound: Location: Patient presents with an ulcer on the right lateral malleolus Quality: Patient reports experiencing a dull pain to affected  area(s). Severity: Patient states wound are getting worse. Duration: Patient has had the wound for >2 years prior to seeking treatment at the wound center Timing: Pain in wound is constant (hurts all the time) Context: The wound would happen gradually Modifying Factors: Other treatment(s) tried include:seen a podiatrist on a vascular surgeons and under treatment with them Associated Signs and Symptoms: Patient reports having increase swelling. 83 year old patient who most recently has been seeing both podiatry and vascular surgery for a long- standing ulcer of her right lateral malleolus which has been treated with various methodologies. Dr. Amalia Hailey the podiatrist saw her on 07/20/2017 and sent her to the wound center for possible hyperbaric oxygen therapy. past medical history of peripheral vascular disease, varicose veins, status post appendectomy, basal cell carcinoma excision from the left leg, cholecystectomy, pacemaker placement, right lower extremity angiography done by Dr. dew in March 2017 with placement of a stent. there is also note of a successful ablation of the right small saphenous vein done which was reviewed by ultrasound on 10/24/2016. the patient had a right small saphenous vein ablation done on 10/20/2016. The patient has never been a smoker. She has been seen by Dr. Corene Cornea dew the vascular surgeon who most recently saw her on 06/15/2017 for evaluation of ongoing problems with right leg swelling. She  had a lower extremity arterial duplex examination done(02/13/17) which showed patent distal right superficial femoral artery stent and above-the- knee popliteal stent without evidence of restenosis. The ABI was more than 1.3 on the right and more than 1.3 on the left. This was consistent with noncompressible arteries due to medial calcification. The right great toe pressure and PPG waveforms are within normal limits and the left great toe pressure and PPG waveforms are  decreased. he recommended she continue to wear her compression stockings and continue with elevation. She is scheduled to have a noninvasive arterial study in the near future 08/16/2017 -- had a lower extremity arterial duplex examination done which showed patent distal right NYRAH, DEMOS. ([id number]) superficial femoral artery stent and above-the-knee popliteal stent without evidence of restenosis. The ABI was more than 1.3 on the right and more than 1.3 on the left. This was consistent with noncompressible arteries due to medial calcification. The right great toe pressure and PPG waveforms are within normal limits and the left great toe pressure and PPG waveforms are decreased. the x-ray of the right ankle has not yet been done Objective Constitutional Pulse regular. Respirations normal and unlabored. Afebrile. Vitals Time Taken: 4:06 PM, Height: 65 in, Weight: 154.3 lbs, BMI: 25.7, Temperature: 98.0 F, Pulse: 89 bpm, Respiratory Rate: 18 breaths/min, Blood Pressure: 140/75 mmHg. Eyes Nonicteric. Reactive to light. Ears, Nose, Mouth, and Throat Lips, teeth, and gums WNL.Marland Kitchen Moist mucosa without lesions. Neck supple and nontender. No palpable supraclavicular or cervical adenopathy. Normal sized without goiter. Respiratory WNL. No retractions.. Cardiovascular Pedal Pulses WNL. No clubbing, cyanosis or edema. Lymphatic No adneopathy. No adenopathy. No adenopathy. Musculoskeletal Adexa without tenderness or enlargement.. Digits and nails w/o clubbing, cyanosis, infection, petechiae, ischemia, or inflammatory conditions.Marland Kitchen Psychiatric Judgement and insight Intact.. No evidence of depression, anxiety, or agitation.[name] ([id number]) General Notes: the wound on the right lateral ankle over the malleolus has a lot of slough and I sharply removed it with a #3 curet and minimal bleeding controlled with pressure does not probe down to bone. Integumentary (Hair, Skin) No  suspicious lesions. No crepitus or fluctuance. No peri-wound warmth or erythema. No masses.. Wound #1 status is Open. Original cause of wound was Gradually Appeared. The wound is located on the Right,Lateral Malleolus. The wound measures 1.2cm length x 1.2cm width x 0.2cm depth; 1.131cm^2 area and 0.226cm^3 volume. There is no tunneling or undermining noted. There is a large amount of serous drainage noted. The wound margin is distinct with the outline attached to the wound base. There is no granulation within the wound bed. There is a large (67-100%) amount of necrotic tissue within the wound bed including Adherent Slough. The periwound skin appearance exhibited: Maceration, Erythema. The surrounding wound skin color is noted with erythema which is circumferential. Periwound temperature was noted as No Abnormality. The periwound has tenderness on palpation. Assessment Active Problems ICD-10 L97.312 - 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d I70.233 - Atherosclerosis of native arteries of right leg with ulceration of ankle I89.0 - Lymphedema, not elsewhere classified Procedures Wound #1 Pre-procedure diagnosis of Wound #1 is a To be determined located on the Right,Lateral Malleolus . There was a Skin/Subcutaneous Tissue Debridement ([id number]) debridement with total area of 1.44 sq cm performed by Christin Fudge, MD. with the following instrument(s): Curette to remove Viable and Non-Viable tissue/material including Exudate, Fibrin/Slough, and Subcutaneous after achieving pain control using Lidocaine 4% Topical Solution. A time out was conducted at 16:18, prior  to the start of the procedure. A Minimum amount of bleeding was controlled with Pressure. The procedure was tolerated well with a pain level of 0 throughout and a pain level of 0 following the procedure. Post Debridement Measurements: 1.2cm length x 1.2cm width x 0.3cm depth; 0.339cm^3 volume. Character of  Wound/Ulcer Post Debridement requires further debridement. Post procedure Diagnosis Wound #1: Same as Pre-Procedure Agramonte, [name] ([id number]) Plan Wound Cleansing: Wound #1 Right,Lateral Malleolus: Clean wound with Normal Saline. Cleanse wound with mild soap and water May Shower, gently pat wound dry prior to applying new dressing. Anesthetic: Wound #1 Right,Lateral Malleolus: Topical Lidocaine 4% cream applied to wound bed prior to debridement - for clinic use Skin Barriers/Peri-Wound Care: Wound #1 Right,Lateral Malleolus: Skin Prep Primary Wound Dressing: Wound #1 Right,Lateral Malleolus: Santyl Ointment Secondary Dressing: Wound #1 Right,Lateral Malleolus: Dry Gauze Boardered Foam Dressing Dressing Change Frequency: Wound #1 Right,Lateral Malleolus: Change dressing every day. Follow-up Appointments: Wound #1 Right,Lateral Malleolus: Return Appointment in 1 week. Edema Control: Wound #1 Right,Lateral Malleolus: Elevate legs to the level of the heart and pump ankles as often as possible Other: - wear your compression stockings Off-Loading: Wound #1 Right,Lateral Malleolus: Turn and reposition every 2 hours Other: - do not put pressure on your right ankle Additional Orders / Instructions: Wound #1 Right,Lateral Malleolus: Increase protein intake. Other: - Add Vitamin C, Zinc, Vitamin A to your diet After review and thorough study of her workup and investigations I have recommended: 1. Santyl ointment locally to be applied with a bordered foam after washing with soap and water 2. Continue to wear her compression stockings as before LANELLE, LINDO. ([id number]) 3. elevation and exercise discussed with her in great detail 4. X-ray of the right ankle -still not done an appointment to be taken soon 5. Arterial duplex studies -- reports reviewed and office visit pending -- to see Dr. Lucky Cowboy 6. Regular visits to the wound center She and her daughter who is at the  bedside have added QUESTIONS answered to their satisfaction Electronic Signature(s) Signed: 08/16/2017 5:02:50 PM By: Christin Fudge MD, FACS Entered By: Christin Fudge on 08/16/2017 17:02:06 Alexa, [name] ([id number]) -------------------------------------------------------------------------------- SuperBill Details Patient Name: [name] Date of Service: 08/16/2017 Medical Record Number: [id number] Patient Account Number: [id number] Date of Birth/Sex: 07/19/1923 (83 y.o. Female) Treating RN: Ahmed Prima Primary Care Provider: FITZGERALD, DAVID Other Clinician: Referring Provider: FITZGERALD, DAVID Treating Provider/Extender: Frann Rider in Treatment: 0 Diagnosis Coding ICD-10 Codes Code Description G29.528 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d I70.233 Atherosclerosis of native arteries of right leg with ulceration of ankle I89.0 Lymphedema, not elsewhere classified Facility Procedures CPT4 Code Description: [id number] 11042 - DEB SUBQ TISSUE 20 SQ CM/< ICD-10 Description Diagnosis L97.312 Non-pressure chronic ulcer of right ankle with fat la I70.233 Atherosclerosis of native arteries of right leg with I89.0 Lymphedema, not elsewhere  classified Modifier: yer exposed ulceration of Quantity: 1 ankle Physician Procedures CPT4 Code Description: [id number] 99213 - WC PHYS LEVEL 3 - EST PT ICD-10 Description Diagnosis L97.312 Non-pressure chronic ulcer of right ankle with fat la I70.233 Atherosclerosis of native arteries of right leg with I89.0 Lymphedema, not elsewhere  classified Modifier: 45 yer exposed ulceration of a Quantity: 1 nkle CPT4 Code Description: [id number] 11042 - WC PHYS SUBQ TISS 20 SQ CM ICD-10 Description Diagnosis L97.312 Non-pressure chronic ulcer of right ankle with fat la I70.233 Atherosclerosis of native arteries of right leg with I89.0 Lymphedema, not elsewhere  classified Modifier:  yer exposed ulceration of a Quantity:  1 nkle Electronic Signature(s) Signed: 08/16/2017 5:02:50 PM By: Christin Fudge MD, FACS Harlea, Goetzinger [name] ([id number]) Entered By: Christin Fudge on 08/16/2017 17:02:22

## 2017-08-20 NOTE — Progress Notes (Signed)
[name] ([id number]) Visit Report for 08/16/2017 Arrival Information Details Patient Name: [name], [name] Date of Service: 08/16/2017 4:00 PM Medical Record Number: [id number] Patient Account Number: [id number] Date of Birth/Sex: 04-Sep-1923 (83 y.o. Female) Treating RN: Ahmed Prima Primary Care Adal Sereno: FITZGERALD, DAVID Other Clinician: Referring German Manke: FITZGERALD, DAVID Treating Shontay Wallner/Extender: Frann Rider in Treatment: 0 Visit Information History Since Last Visit All ordered tests and consults were completed: No Patient Arrived: Ambulatory Added or deleted any medications: No Arrival Time: 16:03 Any new allergies or adverse reactions: No Accompanied By: daughter Had a fall or experienced change in No Transfer Assistance: None activities of daily living that may affect Patient Identification Verified: Yes risk of falls: Secondary Verification Process Yes Signs or symptoms of abuse/neglect since last No Completed: visito Patient Requires Transmission-Based No Hospitalized since last visit: No Precautions: Has Dressing in Place as Prescribed: Yes Patient Has Alerts: No Pain Present Now: No Electronic Signature(s) Signed: 08/16/2017 5:37:07 PM By: Alric Quan Entered By: Alric Quan on 08/16/2017 16:05:57 Oborn, [name] ([id number]) -------------------------------------------------------------------------------- Encounter Discharge Information Details Patient Name: [name] Date of Service: 08/16/2017 4:00 PM Medical Record Number: [id number] Patient Account Number: [id number] Date of Birth/Sex: 28-Nov-1923 (83 y.o. Female) Treating RN: Ahmed Prima Primary Care Riannah Stagner: FITZGERALD, DAVID Other Clinician: Referring Pankaj Haack: FITZGERALD, DAVID Treating Keaira Whitehurst/Extender: Frann Rider in Treatment: 0 Encounter Discharge Information Items Discharge Pain Level: 0 Discharge Condition: Stable Ambulatory  Status: Ambulatory Discharge Destination: Home Transportation: Private Auto Accompanied By: daughter Schedule Follow-up Appointment: Yes Medication Reconciliation completed and provided to Patient/Care No Harry Bark: Provided on Clinical Summary of Care: 08/16/2017 Form Type Recipient Paper Patient WS Electronic Signature(s) Signed: 08/20/2017 10:13:53 AM By: Ruthine Dose Entered By: Ruthine Dose on 08/16/2017 16:29:03 Knauff, [name] ([id number]) -------------------------------------------------------------------------------- Lower Extremity Assessment Details Patient Name: [name] Date of Service: 08/16/2017 4:00 PM Medical Record Number: [id number] Patient Account Number: [id number] Date of Birth/Sex: June 27, 1923 (83 y.o. Female) Treating RN: Ahmed Prima Primary Care Carylon Tamburro: FITZGERALD, DAVID Other Clinician: Referring Arien Benincasa: FITZGERALD, DAVID Treating Anvay Tennis/Extender: Frann Rider in Treatment: 0 Edema Assessment Assessed: [Left: No] [Right: No] Edema: [Left: Ye] [Right: s] Calf Left: Right: Point of Measurement: 32 cm From Medial Instep cm 30.5 cm Ankle Left: Right: Point of Measurement: 11 cm From Medial Instep cm 21.2 cm Vascular Assessment Pulses: Dorsalis Pedis Palpable: [Right:No] Doppler Audible: [Right:Yes] Posterior Tibial Extremity colors, hair growth, and conditions: Extremity Color: [Right:Hyperpigmented] Temperature of Extremity: [Right:Warm] Capillary Refill: [Right:< 3 seconds] Toe Nail Assessment Left: Right: Thick: Yes Discolored: Yes Deformed: Yes Improper Length and Hygiene: Yes Electronic Signature(s) Signed: 08/16/2017 5:37:07 PM By: Alric Quan Entered By: Alric Quan on 08/16/2017 16:14:24 Stoermer, [name] ([id number]) -------------------------------------------------------------------------------- Multi Wound Chart Details Patient Name: [name] Date of Service: 08/16/2017 4:00  PM Medical Record Number: [id number] Patient Account Number: [id number] Date of Birth/Sex: 01-Nov-1923 (83 y.o. Female) Treating RN: Ahmed Prima Primary Care Dezmin Kittelson: FITZGERALD, DAVID Other Clinician: Referring Gedalia Mcmillon: FITZGERALD, DAVID Treating Mirca Yale/Extender: Frann Rider in Treatment: 0 Vital Signs Height(in): 65 Pulse(bpm): 89 Weight(lbs): 154.3 Blood Pressure 140/75 (mmHg): Body Mass Index(BMI): 26 Temperature(F): 98.0 Respiratory Rate 18 (breaths/min): Photos: [1:No Photos] [N/A:N/A] Wound Location: [1:Right Malleolus - Lateral N/A] Wounding Event: [1:Gradually Appeared] [N/A:N/A] Primary Etiology: [1:To be determined] [N/A:N/A] Comorbid History: [1:Cataracts, Congestive Heart Failure, Hypertension, Osteoarthritis, Neuropathy] [N/A:N/A] Date Acquired: [1:08/11/2015] [N/A:N/A] Weeks of Treatment: [1:0] [N/A:N/A] Wound Status: [1:Open] [N/A:N/A] Measurements L x W x D 1.2x1.2x0.2 [N/A:N/A] (cm) Area (cm) : [1:1.131] [N/A:N/A]  Volume (cm) : [1:0.226] [N/A:N/A] % Reduction in Area: [1:26.20%] [N/A:N/A] % Reduction in Volume: 50.80% [N/A:N/A] Classification: [1:Partial Thickness] [N/A:N/A] Exudate Amount: [1:Large] [N/A:N/A] Exudate Type: [1:Serous] [N/A:N/A] Exudate Color: [1:amber] [N/A:N/A] Wound Margin: [1:Distinct, outline attached N/A] Granulation Amount: [1:None Present (0%)] [N/A:N/A] Necrotic Amount: [1:Large (67-100%)] [N/A:N/A] Epithelialization: [1:None] [N/A:N/A] Debridement: [1:Debridement ([id number])] [N/A:N/A] Pre-procedure [1:16:18] [N/A:N/A] Verification/Time Out Taken: [name] ([id number]) Pain Control: Lidocaine 4% Topical N/A N/A Solution Tissue Debrided: Fibrin/Slough, Exudates, N/A N/A Subcutaneous Level: Skin/Subcutaneous N/A N/A Tissue Debridement Area (sq 1.44 N/A N/A cm): Instrument: Curette N/A N/A Bleeding: Minimum N/A N/A Hemostasis Achieved: Pressure N/A N/A Procedural Pain: 0 N/A N/A Post  Procedural Pain: 0 N/A N/A Debridement Treatment Procedure was tolerated N/A N/A Response: well Post Debridement 1.2x1.2x0.3 N/A N/A Measurements L x W x D (cm) Post Debridement 0.339 N/A N/A Volume: (cm) Periwound Skin Texture: No Abnormalities Noted N/A N/A Periwound Skin Maceration: Yes N/A N/A Moisture: Periwound Skin Color: Erythema: Yes N/A N/A Erythema Location: Circumferential N/A N/A Temperature: No Abnormality N/A N/A Tenderness on Yes N/A N/A Palpation: Wound Preparation: Ulcer Cleansing: N/A N/A Rinsed/Irrigated with Saline Topical Anesthetic Applied: Other: lidocaine 4% Procedures Performed: Debridement N/A N/A Treatment Notes Wound #1 (Right, Lateral Malleolus) 1. Cleansed with: Clean wound with Normal Saline 2. Anesthetic Topical Lidocaine 4% cream to wound bed prior to debridement 4. Dressing Applied: Santyl Ointment 5. Secondary Dressing Applied Bordered Foam Dressing Dry Gauze DYASIA, FIRESTINE ([id number]) Electronic Signature(s) Signed: 08/16/2017 5:02:50 PM By: Christin Fudge MD, FACS Entered By: Christin Fudge on 08/16/2017 16:59:53 Dom, [name] ([id number]) -------------------------------------------------------------------------------- [place] Details Patient Name: [name] Date of Service: 08/16/2017 4:00 PM Medical Record Number: [id number] Patient Account Number: [id number] Date of Birth/Sex: 05-Aug-1923 (83 y.o. Female) Treating RN: Ahmed Prima Primary Care Lindamarie Maclachlan: FITZGERALD, DAVID Other Clinician: Referring Cherith Tewell: FITZGERALD, DAVID Treating Ezra Denne/Extender: Frann Rider in Treatment: 0 Active Inactive ` Abuse / Safety / Falls / Self Care Management Nursing Diagnoses: Potential for falls Goals: Patient will not experience any injury related to falls Date Initiated: 08/10/2017 Target Resolution Date: 11/10/2017 Goal Status: Active Interventions: Assess Activities of Daily Living  upon admission and as needed Assess fall risk on admission and as needed Assess: immobility, friction, shearing, incontinence upon admission and as needed Notes: ` Nutrition Nursing Diagnoses: Imbalanced nutrition Potential for alteratiion in Nutrition/Potential for imbalanced nutrition Goals: Patient/caregiver agrees to and verbalizes understanding of need to use nutritional supplements and/or vitamins as prescribed Date Initiated: 08/10/2017 Target Resolution Date: 12/08/2017 Goal Status: Active Interventions: Assess patient nutrition upon admission and as needed per policy Notes: ` Orientation to the Wound Care Program CHARISE, LEINBACH ([id number]) Nursing Diagnoses: Knowledge deficit related to the wound healing center program Goals: Patient/caregiver will verbalize understanding of the [place] Program Date Initiated: 08/10/2017 Target Resolution Date: 09/08/2017 Goal Status: Active Interventions: Provide education on orientation to the wound center Notes: ` Pain, Acute or Chronic Nursing Diagnoses: Pain, acute or chronic: actual or potential Potential alteration in comfort, pain Goals: Patient/caregiver will verbalize adequate pain control between visits Date Initiated: 08/10/2017 Target Resolution Date: 12/08/2017 Goal Status: Active Interventions: Complete pain assessment as per visit requirements Notes: ` Wound/Skin Impairment Nursing Diagnoses: Impaired tissue integrity Knowledge deficit related to ulceration/compromised skin integrity Goals: Ulcer/skin breakdown will have a volume reduction of 80% by week 12 Date Initiated: 08/10/2017 Target Resolution Date: 12/01/2017 Goal Status: Active Interventions: Assess patient/caregiver ability to perform ulcer/skin care regimen upon admission and as needed  NotesAVALIE, OCONNOR ([id number]) Electronic Signature(s) Signed: 08/16/2017 5:37:07 PM By: Alric Quan Entered By: Alric Quan on  08/16/2017 16:14:28 Mancia, [name] ([id number]) -------------------------------------------------------------------------------- Pain Assessment Details Patient Name: [name] Date of Service: 08/16/2017 4:00 PM Medical Record Number: [id number] Patient Account Number: [id number] Date of Birth/Sex: Nov 02, 1923 (83 y.o. Female) Treating RN: Ahmed Prima Primary Care Anntoinette Haefele: FITZGERALD, DAVID Other Clinician: Referring Yasenia Reedy: FITZGERALD, DAVID Treating Chesky Heyer/Extender: Frann Rider in Treatment: 0 Active Problems Location of Pain Severity and Description of Pain Patient Has Paino No Site Locations Pain Management and Medication Current Pain Management: Electronic Signature(s) Signed: 08/16/2017 5:37:07 PM By: Alric Quan Entered By: Alric Quan on 08/16/2017 16:06:03 Melroy, [name] ([id number]) -------------------------------------------------------------------------------- Patient/Caregiver Education Details Patient Name: [name] Date of Service: 08/16/2017 4:00 PM Medical Record Number: [id number] Patient Account Number: [id number] Date of Birth/Gender: 12-27-1922 (83 y.o. Female) Treating RN: Ahmed Prima Primary Care Physician: FITZGERALD, DAVID Other Clinician: Referring Physician: FITZGERALD, DAVID Treating Physician/Extender: Frann Rider in Treatment: 0 Education Assessment Education Provided To: Patient Education Topics Provided Wound/Skin Impairment: Handouts: Other: change dressing as ordered Methods: Demonstration, Explain/Verbal Responses: State content correctly Electronic Signature(s) Signed: 08/16/2017 5:37:07 PM By: Alric Quan Entered By: Alric Quan on 08/16/2017 16:18:35 Mandich, [name] ([id number]) -------------------------------------------------------------------------------- Wound Assessment Details Patient Name: [name] Date of Service: 08/16/2017 4:00  PM Medical Record Number: [id number] Patient Account Number: [id number] Date of Birth/Sex: January 15, 1923 (83 y.o. Female) Treating RN: Ahmed Prima Primary Care Javonda Suh: FITZGERALD, DAVID Other Clinician: Referring Jerene Yeager: FITZGERALD, DAVID Treating Mehdi Gironda/Extender: Frann Rider in Treatment: 0 Wound Status Wound Number: 1 Primary To be determined Etiology: Wound Location: Right Malleolus - Lateral Wound Open Wounding Event: Gradually Appeared Status: Date Acquired: 08/11/2015 Comorbid Cataracts, Congestive Heart Failure, Weeks Of Treatment: 0 History: Hypertension, Osteoarthritis, Clustered Wound: No Neuropathy Photos Photo Uploaded By: Alric Quan on 08/16/2017 17:28:18 Wound Measurements Length: (cm) 1.2 Width: (cm) 1.2 Depth: (cm) 0.2 Area: (cm) 1.131 Volume: (cm) 0.226 % Reduction in Area: 26.2% % Reduction in Volume: 50.8% Epithelialization: None Tunneling: No Undermining: No Wound Description Classification: Partial Thickness Wound Margin: Distinct, outline attached Exudate Amount: Large Exudate Type: Serous Exudate Color: amber Foul Odor After Cleansing: No Slough/Fibrino Yes Wound Bed Granulation Amount: None Present (0%) Necrotic Amount: Large (67-100%) Necrotic Quality: Adherent Slough Thelander, [name] ([id number]) Periwound Skin Texture Texture Color No Abnormalities Noted: No No Abnormalities Noted: No Erythema: Yes Moisture Erythema Location: Circumferential No Abnormalities Noted: No Maceration: Yes Temperature / Pain Temperature: No Abnormality Tenderness on Palpation: Yes Wound Preparation Ulcer Cleansing: Rinsed/Irrigated with Saline Topical Anesthetic Applied: Other: lidocaine 4%, Treatment Notes Wound #1 (Right, Lateral Malleolus) 1. Cleansed with: Clean wound with Normal Saline 2. Anesthetic Topical Lidocaine 4% cream to wound bed prior to debridement 4. Dressing Applied: Santyl Ointment 5. Secondary  Dressing Applied Bordered Foam Dressing Dry Gauze Electronic Signature(s) Signed: 08/16/2017 5:37:07 PM By: Alric Quan Entered By: Alric Quan on 08/16/2017 16:11:52 Graves, [name] ([id number]) -------------------------------------------------------------------------------- [name] Details Patient Name: [name] Date of Service: 08/16/2017 4:00 PM Medical Record Number: [id number] Patient Account Number: [id number] Date of Birth/Sex: March 28, 1923 (83 y.o. Female) Treating RN: Ahmed Prima Primary Care Jamin Panther: FITZGERALD, DAVID Other Clinician: Referring Thailan Sava: FITZGERALD, DAVID Treating Becca Bayne/Extender: Frann Rider in Treatment: 0 Vital Signs Time Taken: 16:06 Temperature (F): 98.0 Height (in): 65 Pulse (bpm): 89 Weight (lbs): 154.3 Respiratory Rate (breaths/min): 18 Body Mass Index (BMI): 25.7 Blood Pressure (mmHg): 140/75 Reference Range: 80 - 120 mg /  dl Electronic Signature(s) Signed: 08/16/2017 5:37:07 PM By: Alric Quan Entered By: Alric Quan on 08/16/2017 16:06:20

## 2017-08-24 ENCOUNTER — Encounter: Payer: Medicare Other | Admitting: Surgery

## 2017-08-24 DIAGNOSIS — I70233 Atherosclerosis of native arteries of right leg with ulceration of ankle: Secondary | ICD-10-CM | POA: Diagnosis not present

## 2017-08-26 NOTE — Progress Notes (Signed)
[name], [name] ([id number]) Visit Report for 08/24/2017 Chief Complaint Document Details Patient Name: [name], [name]. Date of Service: 08/24/2017 11:00 AM Medical Record Number: [id number] Patient Account Number: [id number] Date of Birth/Sex: Apr 22, 1923 (83 y.o. Female) Treating RN: Montey Hora Primary Care Provider: FITZGERALD, DAVID Other Clinician: Referring Provider: FITZGERALD, DAVID Treating Provider/Extender: Frann Rider in Treatment: 2 Information Obtained from: Patient Chief Complaint Patient presents to the wound care center for a consult due non healing wound to the right lateral malleolus which she's had for 2 years Electronic Signature(s) Signed: 08/24/2017 4:09:30 PM By: Christin Fudge MD, FACS Entered By: Christin Fudge on 08/24/2017 12:06:08 [name] ([id number]) -------------------------------------------------------------------------------- Debridement Details Patient Name: [name] Date of Service: 08/24/2017 11:00 AM Medical Record Number: [id number] Patient Account Number: [id number] Date of Birth/Sex: August 30, 1923 (83 y.o. Female) Treating RN: Montey Hora Primary Care Provider: FITZGERALD, DAVID Other Clinician: Referring Provider: FITZGERALD, DAVID Treating Provider/Extender: Frann Rider in Treatment: 2 Debridement Performed for Wound #1 Right,Lateral Malleolus Assessment: Performed By: Physician Christin Fudge, MD Debridement: Debridement Severity of Tissue Pre Fat layer exposed Debridement: Pre-procedure Verification/Time Out Yes - 11:48 Taken: Start Time: 11:49 Pain Control: Lidocaine 4% Topical Solution Level: Skin/Subcutaneous Tissue Total Area Debrided (L x 1.3 (cm) x 1.3 (cm) = 1.69 (cm) W): Tissue and other Viable, Non-Viable, Exudate, Fibrin/Slough, Subcutaneous material debrided: Instrument: Curette Bleeding: Minimum Hemostasis Achieved: Pressure End Time: 11:50 Procedural Pain: 0 Post  Procedural Pain: 0 Response to Treatment: Procedure was tolerated well Post Debridement Measurements of Total Wound Length: (cm) 1.3 Width: (cm) 1.3 Depth: (cm) 0.4 Volume: (cm) 0.531 Character of Wound/Ulcer Post Requires Further Debridement Debridement: Severity of Tissue Post Debridement: Fat layer exposed Post Procedure Diagnosis Same as Pre-procedure Electronic Signature(s) Signed: 08/24/2017 4:09:30 PM By: Christin Fudge MD, FACS Signed: 08/24/2017 5:20:28 PM By: Lacretia Nicks ([id number]) Entered By: Christin Fudge on 08/24/2017 12:06:03 [name] ([id number]) -------------------------------------------------------------------------------- HPI Details Patient Name: [name] Date of Service: 08/24/2017 11:00 AM Medical Record Number: [id number] Patient Account Number: [id number] Date of Birth/Sex: 1923-08-02 (83 y.o. Female) Treating RN: Montey Hora Primary Care Provider: FITZGERALD, DAVID Other Clinician: Referring Provider: FITZGERALD, DAVID Treating Provider/Extender: Frann Rider in Treatment: 2 History of Present Illness Location: Patient presents with an ulcer on the right lateral malleolus Quality: Patient reports experiencing a dull pain to affected area(s). Severity: Patient states wound are getting worse. Duration: Patient has had the wound for >2 years prior to seeking treatment at the wound center Timing: Pain in wound is constant (hurts all the time) Context: The wound would happen gradually Modifying Factors: Other treatment(s) tried include:seen a podiatrist on a vascular surgeons and under treatment with them Associated Signs and Symptoms: Patient reports having increase swelling. 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 Dr. Amalia Hailey the  podiatrist saw her on 07/20/2017 and sent her to the wound center for possible hyperbaric oxygen therapy. past medical history of peripheral vascular disease, varicose veins, status post appendectomy, basal cell carcinoma excision from the left leg, cholecystectomy, pacemaker placement, right lower extremity angiography done by Dr. dew in March 2017 with placement of a stent. there is also note of a successful ablation of the right small saphenous vein done which was reviewed by ultrasound on 10/24/2016. the patient had a right small saphenous vein ablation done on 10/20/2016. The  patient has never been a smoker. She has been seen by Dr. Corene Cornea dew the vascular surgeon who most recently saw her on 06/15/2017 for evaluation of ongoing problems with right leg swelling. She had a lower extremity arterial duplex examination done(02/13/17) which showed patent distal right superficial femoral artery stent and above-the- knee popliteal stent without evidence of restenosis. The ABI was more than 1.3 on the right and more than 1.3 on the left. This was consistent with noncompressible arteries due to medial calcification. The right great toe pressure and PPG waveforms are within normal limits and the left great toe pressure and PPG waveforms are decreased. he recommended she continue to wear her compression stockings and continue with elevation. She is scheduled to have a noninvasive arterial study in the near future 08/16/2017 -- had a lower extremity arterial duplex examination done which showed patent distal right superficial femoral artery stent and above-the-knee popliteal stent without evidence of restenosis. The ABI was more than 1.3 on the right and more than 1.3 on the left. This was consistent with noncompressible arteries due to medial calcification. The right great toe pressure and PPG waveforms are within normal limits and the left great toe pressure and PPG waveforms are decreased. the x-ray  of the right ankle has not yet been done 08/24/2017 -- had a right ankle x-ray -- IMPRESSION:1. No fracture, bone lesion or evidence of [name], [name] ([id number]) osteomyelitis. 2. Lateral soft tissue swelling with a soft tissue ulcer. she has not yet seen the vascular surgeon for review Electronic Signature(s) Signed: 08/24/2017 4:09:30 PM By: Christin Fudge MD, FACS Entered By: Christin Fudge on 08/24/2017 12:06:27 [name] ([id number]) -------------------------------------------------------------------------------- Physical Exam Details Patient Name: [name] Date of Service: 08/24/2017 11:00 AM Medical Record Number: [id number] Patient Account Number: [id number] Date of Birth/Sex: Feb 04, 1923 (83 y.o. Female) Treating RN: Montey Hora Primary Care Provider: FITZGERALD, DAVID Other Clinician: Referring Provider: FITZGERALD, DAVID Treating Provider/Extender: Christin Fudge Weeks in Treatment: 2 Constitutional . Pulse regular. Respirations normal and unlabored. Afebrile. . Eyes Nonicteric. Reactive to light. Ears, Nose, Mouth, and Throat Lips, teeth, and gums WNL.Marland Kitchen Moist mucosa without lesions. Neck supple and nontender. No palpable supraclavicular or cervical adenopathy. Normal sized without goiter. Respiratory WNL. No retractions.. Cardiovascular Pedal Pulses WNL. No clubbing, cyanosis or edema. Lymphatic No adneopathy. No adenopathy. No adenopathy. Musculoskeletal Adexa without tenderness or enlargement.. Digits and nails w/o clubbing, cyanosis, infection, petechiae, ischemia, or inflammatory conditions.. Integumentary (Hair, Skin) No suspicious lesions. No crepitus or fluctuance. No peri-wound warmth or erythema. No masses.Marland Kitchen Psychiatric Judgement and insight Intact.. No evidence of depression, anxiety, or agitation.. Notes the wound on the right lateral ankle continues to have slough which have sharply removed with a #3 curet and minimal bleeding  controlled with pressure. It does not probe down to bone. Electronic Signature(s) Signed: 08/24/2017 4:09:30 PM By: Christin Fudge MD, FACS Entered By: Christin Fudge on 08/24/2017 12:06:58 [name] ([id number]) -------------------------------------------------------------------------------- Physician Orders Details Patient Name: [name] Date of Service: 08/24/2017 11:00 AM Medical Record Number: [id number] Patient Account Number: [id number] Date of Birth/Sex: September 13, 1923 (83 y.o. Female) Treating RN: Montey Hora Primary Care Provider: FITZGERALD, DAVID Other Clinician: Referring Provider: FITZGERALD, DAVID Treating Provider/Extender: Frann Rider in Treatment: 2 Verbal / Phone Orders: Yes Clinician: Dorthy, [name] Read Back and Verified: Yes Diagnosis Coding Wound Cleansing Wound #1 Right,Lateral Malleolus o Clean wound with Normal Saline. o Cleanse wound with mild soap and water o May Shower, gently pat wound dry  prior to applying new dressing. Anesthetic Wound #1 Right,Lateral Malleolus o Topical Lidocaine 4% cream applied to wound bed prior to debridement - for clinic use Skin Barriers/Peri-Wound Care Wound #1 Right,Lateral Malleolus o Skin Prep Primary Wound Dressing Wound #1 Right,Lateral Malleolus o Santyl Ointment Secondary Dressing Wound #1 Right,Lateral Malleolus o Dry Gauze o Boardered Foam Dressing Dressing Change Frequency Wound #1 Right,Lateral Malleolus o Change dressing every day. Follow-up Appointments Wound #1 Right,Lateral Malleolus o Return Appointment in 1 week. Edema Control Wound #1 Right,Lateral Malleolus o Elevate legs to the level of the heart and pump ankles as often as possible o Other: - wear your compression stockings Jasmer, [name] ([id number]) Off-Loading Wound #1 Right,Lateral Malleolus o Turn and reposition every 2 hours o Other: - do not put pressure on your right  ankle Additional Orders / Instructions Wound #1 Right,Lateral Malleolus o Increase protein intake. o Other: - Add Vitamin C, Zinc, Vitamin A to your diet Electronic Signature(s) Signed: 08/24/2017 4:09:30 PM By: Christin Fudge MD, FACS Signed: 08/24/2017 5:20:28 PM By: Montey Hora Entered By: Montey Hora on 08/24/2017 11:50:54 Meidinger, [name] ([id number]) -------------------------------------------------------------------------------- Problem List Details Patient Name: MUSKAN, BOLLA Date of Service: 08/24/2017 11:00 AM Medical Record Number: [id number] Patient Account Number: [id number] Date of Birth/Sex: 1923/04/09 (83 y.o. Female) Treating RN: Montey Hora Primary Care Provider: FITZGERALD, DAVID Other Clinician: Referring Provider: FITZGERALD, DAVID Treating Provider/Extender: Frann Rider in Treatment: 2 Active Problems ICD-10 Encounter Code Description Active Date Diagnosis L97.312 Non-pressure chronic ulcer of right ankle with fat layer 08/10/2017 Yes exposed I70.233 Atherosclerosis of native arteries of right leg with 08/10/2017 Yes ulceration of ankle I89.0 Lymphedema, not elsewhere classified 08/10/2017 Yes Inactive Problems Resolved Problems Electronic Signature(s) Signed: 08/24/2017 4:09:30 PM By: Christin Fudge MD, FACS Entered By: Christin Fudge on 08/24/2017 12:05:52 Elmquist, [name] ([id number]) -------------------------------------------------------------------------------- Progress Note Details Patient Name: [name] Date of Service: 08/24/2017 11:00 AM Medical Record Number: [id number] Patient Account Number: [id number] Date of Birth/Sex: [birth year] (83 y.o. Female) Treating RN: Montey Hora Primary Care Provider: FITZGERALD, DAVID Other Clinician: Referring Provider: FITZGERALD, DAVID Treating Provider/Extender: Frann Rider in Treatment: 2 Subjective Chief Complaint Information obtained from Patient Patient  presents to the wound care center for a consult due non healing wound to the right lateral malleolus which she's had for 2 years History of Present Illness (HPI) The following HPI elements were documented for the patient's wound: Location: Patient presents with an ulcer on the right lateral malleolus Quality: Patient reports experiencing a dull pain to affected area(s). Severity: Patient states wound are getting worse. Duration: Patient has had the wound for >2 years prior to seeking treatment at the wound center Timing: Pain in wound is constant (hurts all the time) Context: The wound would happen gradually Modifying Factors: Other treatment(s) tried include:seen a podiatrist on a vascular surgeons and under treatment with them Associated Signs and Symptoms: Patient reports having increase swelling. 83 year old patient who most recently has been seeing both podiatry and vascular surgery for a long- standing ulcer of her right lateral malleolus which has been treated with various methodologies. Dr. Amalia Hailey the podiatrist saw her on 07/20/2017 and sent her to the wound center for possible hyperbaric oxygen therapy. past medical history of peripheral vascular disease, varicose veins, status post appendectomy, basal cell carcinoma excision from the left leg, cholecystectomy, pacemaker placement, right lower extremity angiography done by Dr. dew in March 2017 with placement of a stent. there is also note of a successful ablation  of the right small saphenous vein done which was reviewed by ultrasound on 10/24/2016. the patient had a right small saphenous vein ablation done on 10/20/2016. The patient has never been a smoker. She has been seen by Dr. Corene Cornea dew the vascular surgeon who most recently saw her on 06/15/2017 for evaluation of ongoing problems with right leg swelling. She had a lower extremity arterial duplex examination done(02/13/17) which showed patent distal right superficial femoral  artery stent and above-the- knee popliteal stent without evidence of restenosis. The ABI was more than 1.3 on the right and more than 1.3 on the left. This was consistent with noncompressible arteries due to medial calcification. The right great toe pressure and PPG waveforms are within normal limits and the left great toe pressure and PPG waveforms are decreased. he recommended she continue to wear her compression stockings and continue with elevation. She is scheduled to have a noninvasive arterial study in the near future 08/16/2017 -- had a lower extremity arterial duplex examination done which showed patent distal right [name], [name]. ([id number]) superficial femoral artery stent and above-the-knee popliteal stent without evidence of restenosis. The ABI was more than 1.3 on the right and more than 1.3 on the left. This was consistent with noncompressible arteries due to medial calcification. The right great toe pressure and PPG waveforms are within normal limits and the left great toe pressure and PPG waveforms are decreased. the x-ray of the right ankle has not yet been done 08/24/2017 -- had a right ankle x-ray -- IMPRESSION:1. No fracture, bone lesion or evidence of osteomyelitis. 2. Lateral soft tissue swelling with a soft tissue ulcer. she has not yet seen the vascular surgeon for review Objective Constitutional Pulse regular. Respirations normal and unlabored. Afebrile. Vitals Time Taken: 11:23 AM, Height: 65 in, Weight: 154.3 lbs, BMI: 25.7, Pulse: 80 bpm, Respiratory Rate: 18 breaths/min, Blood Pressure: 145/65 mmHg. Eyes Nonicteric. Reactive to light. Ears, Nose, Mouth, and Throat Lips, teeth, and gums WNL.Marland Kitchen Moist mucosa without lesions. Neck supple and nontender. No palpable supraclavicular or cervical adenopathy. Normal sized without goiter. Respiratory WNL. No retractions.. Cardiovascular Pedal Pulses WNL. No clubbing, cyanosis or edema. Lymphatic No adneopathy.  No adenopathy. No adenopathy. Musculoskeletal Adexa without tenderness or enlargement.. Digits and nails w/o clubbing, cyanosis, infection, petechiae, ischemia, or inflammatory conditions.[name] ([id number]) Psychiatric Judgement and insight Intact.. No evidence of depression, anxiety, or agitation.. General Notes: the wound on the right lateral ankle continues to have slough which have sharply removed with a #3 curet and minimal bleeding controlled with pressure. It does not probe down to bone. Integumentary (Hair, Skin) No suspicious lesions. No crepitus or fluctuance. No peri-wound warmth or erythema. No masses.. Wound #1 status is Open. Original cause of wound was Gradually Appeared. The wound is located on the Right,Lateral Malleolus. The wound measures 1.3cm length x 1.3cm width x 0.3cm depth; 1.327cm^2 area and 0.398cm^3 volume. There is no tunneling or undermining noted. There is a large amount of serous drainage noted. The wound margin is distinct with the outline attached to the wound base. There is no granulation within the wound bed. There is a large (67-100%) amount of necrotic tissue within the wound bed including Adherent Slough. The periwound skin appearance exhibited: Maceration, Erythema. The surrounding wound skin color is noted with erythema which is circumferential. Periwound temperature was noted as No Abnormality. The periwound has tenderness on palpation. 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 There was a Skin/Subcutaneous Tissue Debridement ([id number]) debridement with total area of  1.69 sq cm performed by Christin Fudge, MD. with the following instrument(s): Curette to remove Viable and Non-Viable tissue/material including Exudate, Fibrin/Slough, and Subcutaneous after achieving pain control using Lidocaine 4% Topical Solution. A time out was conducted at 11:48, prior to the start of the procedure. A Minimum amount of bleeding was controlled with Pressure. The procedure was tolerated well with a pain level of 0 throughout and a pain level of 0 following the procedure. Post Debridement Measurements: 1.3cm length x 1.3cm width x 0.4cm depth; 0.531cm^3 volume. Character of Wound/Ulcer Post Debridement requires further debridement. Severity of Tissue Post Debridement is: Fat layer exposed. [name], [name] ([id number]) Post procedure Diagnosis Wound #1: Same as Pre-Procedure Plan Wound Cleansing: Wound #1 Right,Lateral Malleolus: Clean wound with Normal Saline. Cleanse wound with mild soap and water May Shower, gently pat wound dry prior to applying new dressing. Anesthetic: Wound #1 Right,Lateral Malleolus: Topical Lidocaine 4% cream applied to wound bed prior to debridement - for clinic use Skin Barriers/Peri-Wound Care: Wound #1 Right,Lateral Malleolus: Skin Prep Primary Wound Dressing: Wound #1 Right,Lateral Malleolus: Santyl Ointment Secondary Dressing: Wound #1 Right,Lateral Malleolus: Dry Gauze Boardered Foam Dressing Dressing Change Frequency: Wound #1 Right,Lateral Malleolus: Change dressing every day. Follow-up Appointments: Wound #1 Right,Lateral Malleolus: Return Appointment in 1 week. Edema Control: Wound #1 Right,Lateral Malleolus: Elevate legs to the level of the heart and pump ankles as often as possible Other: - wear your compression stockings Off-Loading: Wound #1 Right,Lateral Malleolus: Turn and reposition every 2 hours Other: - do not put pressure on your right ankle Additional Orders / Instructions: Wound #1 Right,Lateral  Malleolus: Increase protein intake. Other: - Add Vitamin C, Zinc, Vitamin A to your diet [name], [name] ([id number]) After sharp debridement and a review, I have recommended: 1. Santyl ointment locally to be applied with a bordered foam after washing with soap and water 2. Continue to wear her compression stockings as before 3. elevation and exercise discussed with her in great detail 4. X-ray of the right ankle -results discussed with them 5. appointment to see the vascular surgeons pending 6. Regular visits to the wound center She and her daughter who is at the bedside have added QUESTIONS answered to their satisfaction Electronic Signature(s) Signed: 08/24/2017 4:09:30 PM By: Christin Fudge MD, FACS Entered By: Christin Fudge on 08/24/2017 12:08:21 [name] ([id number]) -------------------------------------------------------------------------------- SuperBill Details Patient Name: [name] Date of Service: 08/24/2017 Medical Record Number: [id number] Patient Account Number: [id number] Date of Birth/Sex: 1923/01/06 (83 y.o. Female) Treating RN: Montey Hora Primary Care Provider: FITZGERALD, DAVID Other Clinician: Referring Provider: FITZGERALD, DAVID Treating Provider/Extender: Frann Rider in Treatment: 2 Diagnosis Coding ICD-10 Codes Code Description B51.025 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d I70.233 Atherosclerosis of native arteries of right leg with ulceration of ankle I89.0 Lymphedema, not elsewhere classified Facility Procedures CPT4 Code Description: [id number] 11042 - DEB SUBQ TISSUE 20 SQ CM/< ICD-10 Description Diagnosis L97.312 Non-pressure chronic ulcer of right ankle with fat la I70.233 Atherosclerosis of native arteries of right leg with I89.0 Lymphedema, not elsewhere  classified Modifier: yer exposed ulceration of Quantity: 1 ankle Physician Procedures CPT4 Code Description: [id number]  99213 - WC PHYS  LEVEL 3 - EST PT ICD-10 Description Diagnosis L97.312 Non-pressure chronic ulcer of right ankle with fat la I70.233 Atherosclerosis of native arteries of right leg with I89.0 Lymphedema, not elsewhere  classified Modifier: 25 yer exposed ulceration of a Quantity: 1 nkle CPT4 Code Description: [id number] 11042 - WC PHYS SUBQ TISS 20 SQ CM ICD-10 Description Diagnosis L97.312 Non-pressure chronic ulcer of right ankle with fat la I70.233 Atherosclerosis of native arteries of right leg with I89.0 Lymphedema, not elsewhere  classified Modifier: yer exposed ulceration of a Quantity: 1 nkle Electronic Signature(s) Signed: 08/24/2017 4:09:30 PM By: Christin Fudge MD, FACS [name] ([id number]) Entered By: Christin Fudge on 08/24/2017 12:08:45

## 2017-08-27 NOTE — Progress Notes (Signed)
[name], [name] ([id number]) Visit Report for 08/24/2017 Arrival Information Details Patient Name: [name], [name] Date of Service: 08/24/2017 11:00 AM Medical Record Number: [id number] Patient Account Number: [id number] Date of Birth/Sex: 12-09-1922 (83 y.o. Female) Treating RN: Montey Hora Primary Care Margrit Minner: FITZGERALD, DAVID Other Clinician: Referring Imad Shostak: FITZGERALD, DAVID Treating Georgana Romain/Extender: Frann Rider in Treatment: 2 Visit Information History Since Last Visit Added or deleted any medications: No Patient Arrived: Ambulatory Any new allergies or adverse reactions: No Arrival Time: 11:21 Had a fall or experienced change in No Accompanied By: dtr activities of daily living that may affect Transfer Assistance: None risk of falls: Patient Identification Verified: Yes Signs or symptoms of abuse/neglect since last No Secondary Verification Process Yes visito Completed: Hospitalized since last visit: No Patient Requires Transmission-Based No Has Dressing in Place as Prescribed: Yes Precautions: Pain Present Now: No Patient Has Alerts: No Electronic Signature(s) Signed: 08/24/2017 5:20:28 PM By: Montey Hora Entered By: Montey Hora on 08/24/2017 11:22:19 [name], [name] ([id number]) -------------------------------------------------------------------------------- Encounter Discharge Information Details Patient Name: [name] Date of Service: 08/24/2017 11:00 AM Medical Record Number: [id number] Patient Account Number: [id number] Date of Birth/Sex: 01/03/1923 (83 y.o. Female) Treating RN: Montey Hora Primary Care Anniece Bleiler: FITZGERALD, DAVID Other Clinician: Referring Eldred Sooy: FITZGERALD, DAVID Treating Ladina Shutters/Extender: Frann Rider in Treatment: 2 Encounter Discharge Information Items Discharge Pain Level: 0 Discharge Condition: Stable Ambulatory Status: Ambulatory Discharge Destination: Home Transportation:  Private Auto Accompanied By: daughter Schedule Follow-up Appointment: Yes Medication Reconciliation completed and provided to Patient/Care No Delynda Sepulveda: Provided on Clinical Summary of Care: 08/24/2017 Form Type Recipient Paper Patient WS Electronic Signature(s) Signed: 08/27/2017 9:26:52 AM By: Ruthine Dose Entered By: Ruthine Dose on 08/24/2017 11:59:48 Rando, [name] ([id number]) -------------------------------------------------------------------------------- Lower Extremity Assessment Details Patient Name: [name] Date of Service: 08/24/2017 11:00 AM Medical Record Number: [id number] Patient Account Number: [id number] Date of Birth/Sex: 1923-03-08 (83 y.o. Female) Treating RN: Montey Hora Primary Care Kayline Sheer: FITZGERALD, DAVID Other Clinician: Referring Tyrah Broers: FITZGERALD, DAVID Treating Lynelle Weiler/Extender: Frann Rider in Treatment: 2 Edema Assessment Assessed: [Left: No] [Right: No] E[Left: dema] [Right: :] Calf Left: Right: Point of Measurement: 32 cm From Medial Instep cm cm Ankle Left: Right: Point of Measurement: 11 cm From Medial Instep cm cm Vascular Assessment Pulses: Dorsalis Pedis Palpable: [Right:Yes] Posterior Tibial Extremity colors, hair growth, and conditions: Extremity Color: [Right:Hyperpigmented] Hair Growth on Extremity: [Right:No] Temperature of Extremity: [Right:Warm] Capillary Refill: [Right:< 3 seconds] Electronic Signature(s) Signed: 08/24/2017 5:20:28 PM By: Montey Hora Entered By: Montey Hora on 08/24/2017 11:30:12 [name], [name] ([id number]) -------------------------------------------------------------------------------- Multi Wound Chart Details Patient Name: [name] Date of Service: 08/24/2017 11:00 AM Medical Record Number: [id number] Patient Account Number: [id number] Date of Birth/Sex: 16-Dec-1922 (83 y.o. Female) Treating RN: Montey Hora Primary Care Ellakate Gonsalves: FITZGERALD,  DAVID Other Clinician: Referring Danell Vazquez: FITZGERALD, DAVID Treating Argelio Granier/Extender: Frann Rider in Treatment: 2 Vital Signs Height(in): 65 Pulse(bpm): 80 Weight(lbs): 154.3 Blood Pressure 145/65 (mmHg): Body Mass Index(BMI): 26 Temperature(F): Respiratory Rate 18 (breaths/min): Photos: [1:No Photos] [N/A:N/A] Wound Location: [1:Right Malleolus - Lateral N/A] Wounding Event: [1:Gradually Appeared] [N/A:N/A] Primary Etiology: [1:Lymphedema] [N/A:N/A] Secondary Etiology: [1:Arterial Insufficiency Ulcer N/A] Comorbid History: [1:Cataracts, Congestive Heart Failure, Hypertension, Osteoarthritis, Neuropathy] [N/A:N/A] Date Acquired: [1:08/11/2015] [N/A:N/A] Weeks of Treatment: [1:2] [N/A:N/A] Wound Status: [1:Open] [N/A:N/A] Measurements L x W x D 1.3x1.3x0.3 [N/A:N/A] (cm) Area (cm) : [1:1.327] [N/A:N/A] Volume (cm) : [1:0.398] [N/A:N/A] % Reduction in Area: [1:13.40%] [N/A:N/A] % Reduction in Volume: 13.30% [N/A:N/A] Classification: [1:Partial Thickness] [N/A:N/A] Exudate  Amount: [1:Large] [N/A:N/A] Exudate Type: [1:Serous] [N/A:N/A] Exudate Color: [1:amber] [N/A:N/A] Wound Margin: [1:Distinct, outline attached N/A] Granulation Amount: [1:None Present (0%)] [N/A:N/A] Necrotic Amount: [1:Large (67-100%)] [N/A:N/A] Epithelialization: [1:None] [N/A:N/A] Debridement: [1:Debridement ([id number]) 11:48] [N/A:N/A N/A] Pre-procedure Verification/Time Out Taken: Pain Control: Lidocaine 4% Topical N/A N/A Solution Tissue Debrided: Fibrin/Slough, Exudates, N/A N/A Subcutaneous Level: Skin/Subcutaneous N/A N/A Tissue Debridement Area (sq 1.69 N/A N/A cm): Instrument: Curette N/A N/A Bleeding: Minimum N/A N/A Hemostasis Achieved: Pressure N/A N/A Procedural Pain: 0 N/A N/A Post Procedural Pain: 0 N/A N/A Debridement Treatment Procedure was tolerated N/A N/A Response: well Post Debridement 1.3x1.3x0.4 N/A N/A Measurements L x W x D (cm) Post Debridement  0.531 N/A N/A Volume: (cm) Periwound Skin Texture: No Abnormalities Noted N/A N/A Periwound Skin Maceration: Yes N/A N/A Moisture: Periwound Skin Color: Erythema: Yes N/A N/A Erythema Location: Circumferential N/A N/A Temperature: No Abnormality N/A N/A Tenderness on Yes N/A N/A Palpation: Wound Preparation: Ulcer Cleansing: N/A N/A Rinsed/Irrigated with Saline Topical Anesthetic Applied: Other: lidocaine 4% Procedures Performed: Debridement N/A N/A Treatment Notes Wound #1 (Right, Lateral Malleolus) 1. Cleansed with: Clean wound with Normal Saline 2. Anesthetic Topical Lidocaine 4% cream to wound bed prior to debridement 3. Peri-wound Care: Skin Prep 4. Dressing Applied: LEIGHANN, AMADON ([id number]) Santyl Ointment 5. Secondary Dressing Applied Bordered Foam Dressing Dry Gauze Electronic Signature(s) Signed: 08/24/2017 4:09:30 PM By: Christin Fudge MD, FACS Entered By: Christin Fudge on 08/24/2017 12:05:56 [name], [name] ([id number]) -------------------------------------------------------------------------------- [place] Details Patient Name: [name] Date of Service: 08/24/2017 11:00 AM Medical Record Number: [id number] Patient Account Number: [id number] Date of Birth/Sex: [birth year] (83 y.o. Female) Treating RN: Montey Hora Primary Care Tanisha Lutes: FITZGERALD, DAVID Other Clinician: Referring Casondra Gasca: FITZGERALD, DAVID Treating Travious Vanover/Extender: Frann Rider in Treatment: 2 Active Inactive ` Abuse / Safety / Falls / Self Care Management Nursing Diagnoses: Potential for falls Goals: Patient will not experience any injury related to falls Date Initiated: 08/10/2017 Target Resolution Date: 11/10/2017 Goal Status: Active Interventions: Assess Activities of Daily Living upon admission and as needed Assess fall risk on admission and as needed Assess: immobility, friction, shearing, incontinence upon admission and as  needed Notes: ` Nutrition Nursing Diagnoses: Imbalanced nutrition Potential for alteratiion in Nutrition/Potential for imbalanced nutrition Goals: Patient/caregiver agrees to and verbalizes understanding of need to use nutritional supplements and/or vitamins as prescribed Date Initiated: 08/10/2017 Target Resolution Date: 12/08/2017 Goal Status: Active Interventions: Assess patient nutrition upon admission and as needed per policy Notes: ` Orientation to the Wound Care Program [name], [name] ([id number]) Nursing Diagnoses: Knowledge deficit related to the wound healing center program Goals: Patient/caregiver will verbalize understanding of the [facility] Date Initiated: 08/10/2017 Target Resolution Date: 09/08/2017 Goal Status: Active Interventions: Provide education on orientation to the wound center Notes: ` Pain, Acute or Chronic Nursing Diagnoses: Pain, acute or chronic: actual or potential Potential alteration in comfort, pain Goals: Patient/caregiver will verbalize adequate pain control between visits Date Initiated: 08/10/2017 Target Resolution Date: 12/08/2017 Goal Status: Active Interventions: Complete pain assessment as per visit requirements Notes: ` Wound/Skin Impairment Nursing Diagnoses: Impaired tissue integrity Knowledge deficit related to ulceration/compromised skin integrity Goals: Ulcer/skin breakdown will have a volume reduction of 80% by week 12 Date Initiated: 08/10/2017 Target Resolution Date: 12/01/2017 Goal Status: Active Interventions: Assess patient/caregiver ability to perform ulcer/skin care regimen upon admission and as needed Notes: [name], [name] ([id number]) Electronic Signature(s) Signed: 08/24/2017 5:20:28 PM By: Montey Hora Entered By: Montey Hora on 08/24/2017 11:47:54  [name], [name] ([id number]) -------------------------------------------------------------------------------- Pain Assessment  Details Patient Name: [name], [name]. Date of Service: 08/24/2017 11:00 AM Medical Record Number: [id number] Patient Account Number: [id number] Date of Birth/Sex: 1923-01-17 (83 y.o. Female) Treating RN: Montey Hora Primary Care Liberty Seto: FITZGERALD, DAVID Other Clinician: Referring Toneka Fullen: FITZGERALD, DAVID Treating Magally Vahle/Extender: Frann Rider in Treatment: 2 Active Problems Location of Pain Severity and Description of Pain Patient Has Paino No Site Locations Pain Management and Medication Current Pain Management: Electronic Signature(s) Signed: 08/24/2017 5:20:28 PM By: Montey Hora Entered By: Montey Hora on 08/24/2017 11:22:29 [name] ([id number]) -------------------------------------------------------------------------------- Patient/Caregiver Education Details Patient Name: [name] Date of Service: 08/24/2017 11:00 AM Medical Record Number: [id number] Patient Account Number: [id number] Date of Birth/Gender: 31-May-1923 (83 y.o. Female) Treating RN: Montey Hora Primary Care Physician: FITZGERALD, DAVID Other Clinician: Referring Physician: FITZGERALD, DAVID Treating Physician/Extender: Frann Rider in Treatment: 2 Education Assessment Education Provided To: Patient Education Topics Provided Wound/Skin Impairment: Handouts: Other: change dressing as ordered Methods: Demonstration, Explain/Verbal Responses: State content correctly Electronic Signature(s) Signed: 08/24/2017 5:20:28 PM By: Montey Hora Entered By: Montey Hora on 08/24/2017 11:48:37 [name], [name] ([id number]) -------------------------------------------------------------------------------- Wound Assessment Details Patient Name: [name] Date of Service: 08/24/2017 11:00 AM Medical Record Number: [id number] Patient Account Number: [id number] Date of Birth/Sex: Apr 16, 1923 (83 y.o. Female) Treating RN: Montey Hora Primary Care  Jorel Gravlin: FITZGERALD, DAVID Other Clinician: Referring Lateia Fraser: FITZGERALD, DAVID Treating Rajah Tagliaferro/Extender: Frann Rider in Treatment: 2 Wound Status Wound Number: 1 Primary Lymphedema Etiology: Wound Location: Right Malleolus - Lateral Secondary Arterial Insufficiency Ulcer Wounding Event: Gradually Appeared Etiology: Date Acquired: 08/11/2015 Wound Open Weeks Of Treatment: 2 Status: Clustered Wound: No Comorbid Cataracts, Congestive Heart Failure, History: Hypertension, Osteoarthritis, Neuropathy Wound Measurements Length: (cm) 1.3 Width: (cm) 1.3 Depth: (cm) 0.3 Area: (cm) 1.327 Volume: (cm) 0.398 % Reduction in Area: 13.4% % Reduction in Volume: 13.3% Epithelialization: None Tunneling: No Undermining: No Wound Description Classification: Partial Thickness Wound Margin: Distinct, outline attached Exudate Amount: Large Exudate Type: Serous Exudate Color: amber Foul Odor After Cleansing: No Slough/Fibrino Yes Wound Bed Granulation Amount: None Present (0%) Necrotic Amount: Large (67-100%) Necrotic Quality: Adherent Slough Periwound Skin Texture Texture Color No Abnormalities Noted: No No Abnormalities Noted: No Erythema: Yes Moisture Erythema Location: Circumferential No Abnormalities Noted: No Maceration: Yes Temperature / Pain Temperature: No Abnormality Tenderness on Palpation: Yes Wound Preparation [name], [name] ([id number]) Ulcer Cleansing: Rinsed/Irrigated with Saline Topical Anesthetic Applied: Other: lidocaine 4%, Treatment Notes Wound #1 (Right, Lateral Malleolus) 1. Cleansed with: Clean wound with Normal Saline 2. Anesthetic Topical Lidocaine 4% cream to wound bed prior to debridement 3. Peri-wound Care: Skin Prep 4. Dressing Applied: Santyl Ointment 5. Secondary Dressing Applied Bordered Foam Dressing Dry Gauze Electronic Signature(s) Signed: 08/24/2017 5:20:28 PM By: Montey Hora Entered By: Montey Hora on  08/24/2017 11:29:53 [name], [name] ([id number]) -------------------------------------------------------------------------------- [place] Details Patient Name: [name] Date of Service: 08/24/2017 11:00 AM Medical Record Number: [id number] Patient Account Number: [id number] Date of Birth/Sex: 02-12-1923 (83 y.o. Female) Treating RN: Montey Hora Primary Care Maximilian Tallo: FITZGERALD, DAVID Other Clinician: Referring Hettie Roselli: FITZGERALD, DAVID Treating Brittanyann Wittner/Extender: Frann Rider in Treatment: 2 Vital Signs Time Taken: 11:23 Pulse (bpm): 80 Height (in): 65 Respiratory Rate (breaths/min): 18 Weight (lbs): 154.3 Blood Pressure (mmHg): 145/65 Body Mass Index (BMI): 25.7 Reference Range: 80 - 120 mg / dl Electronic Signature(s) Signed: 08/24/2017 5:20:28 PM By: Montey Hora Entered By: Montey Hora on 08/24/2017 11:24:59

## 2017-08-31 ENCOUNTER — Encounter: Payer: Medicare Other | Admitting: Physician Assistant

## 2017-08-31 DIAGNOSIS — I70233 Atherosclerosis of native arteries of right leg with ulceration of ankle: Secondary | ICD-10-CM | POA: Diagnosis not present

## 2017-09-03 NOTE — Progress Notes (Signed)
FADUMO, HENG ([id number]) Visit Report for 08/31/2017 Chief Complaint Document Details Patient Name: [name], [name]. Date of Service: 08/31/2017 2:30 PM Medical Record Number: [id number] Patient Account Number: [id number] Date of Birth/Sex: 22-Jan-1923 (83 y.o. Female) Treating RN: Ahmed Prima Primary Care Provider: FITZGERALD, DAVID Other Clinician: Referring Provider: FITZGERALD, DAVID Treating Provider/Extender: STONE III, HOYT Weeks in Treatment: 3 Information Obtained from: Patient Chief Complaint Patient presents to the wound care center for a consult due non healing wound to the right lateral malleolus Electronic Signature(s) Signed: 08/31/2017 5:13:37 PM By: Worthy Keeler PA-C Entered By: Worthy Keeler on 08/31/2017 14:45:04 Pelley, [name] ([id number]) -------------------------------------------------------------------------------- Debridement Details Patient Name: [name] Date of Service: 08/31/2017 2:30 PM Medical Record Number: [id number] Patient Account Number: [id number] Date of Birth/Sex: 01/04/1923 (83 y.o. Female) Treating RN: Ahmed Prima Primary Care Provider: FITZGERALD, DAVID Other Clinician: Referring Provider: FITZGERALD, DAVID Treating Provider/Extender: STONE III, HOYT Weeks in Treatment: 3 Debridement Performed for Wound #1 Right,Lateral Malleolus Assessment: Performed By: Physician STONE III, HOYT E., PA-C Debridement: Debridement Severity of Tissue Pre Fat layer exposed Debridement: Pre-procedure Verification/Time Out Yes - 14:49 Taken: Start Time: 14:50 Pain Control: Lidocaine 4% Topical Solution Level: Skin/Subcutaneous Tissue Total Area Debrided (L x 1.2 (cm) x 1.4 (cm) = 1.68 (cm) W): Tissue and other Viable, Non-Viable, Exudate, Fibrin/Slough, Subcutaneous material debrided: Instrument: Curette Bleeding: Minimum Hemostasis Achieved: Pressure End Time: 14:52 Procedural Pain: 0 Post Procedural Pain:  0 Response to Treatment: Procedure was tolerated well Post Debridement Measurements of Total Wound Length: (cm) 1.2 Width: (cm) 1.4 Depth: (cm) 0.3 Volume: (cm) 0.396 Character of Wound/Ulcer Post Requires Further Debridement Debridement: Severity of Tissue Post Debridement: Fat layer exposed Post Procedure Diagnosis Same as Pre-procedure Electronic Signature(s) Signed: 08/31/2017 5:13:37 PM By: Worthy Keeler PA-C Signed: 09/03/2017 9:42:14 AM By: Julious Payer ([id number]) Entered By: Alric Quan on 08/31/2017 14:52:41 Hedman, [name] ([id number]) -------------------------------------------------------------------------------- HPI Details Patient Name: [name] Date of Service: 08/31/2017 2:30 PM Medical Record Number: [id number] Patient Account Number: [id number] Date of Birth/Sex: December 12, 1922 (83 y.o. Female) Treating RN: Ahmed Prima Primary Care Provider: FITZGERALD, DAVID Other Clinician: Referring Provider: FITZGERALD, DAVID Treating Provider/Extender: STONE III, HOYT Weeks in Treatment: 3 History of Present Illness Location: Patient presents with an ulcer on the right lateral malleolus Quality: Patient reports experiencing a dull pain to affected area(s). Severity: Patient states wound are getting worse. Duration: Patient has had the wound for >2 years prior to seeking treatment at the wound center Timing: Pain in wound is constant (hurts all the time) Context: The wound would happen gradually Modifying Factors: Other treatment(s) tried include:seen a podiatrist on a vascular surgeons and under treatment with them Associated Signs and Symptoms: Patient reports having increase swelling. 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 Dr. Amalia Hailey the podiatrist saw  her on 07/20/2017 and sent her to the wound center for possible hyperbaric oxygen therapy. past medical history of peripheral vascular disease, varicose veins, status post appendectomy, basal cell carcinoma excision from the left leg, cholecystectomy, pacemaker placement, right lower extremity angiography done by Dr. dew in March 2017 with placement of a stent. there is also note of a successful ablation of the right small saphenous vein done which was reviewed by ultrasound on 10/24/2016. the patient had a right small saphenous vein ablation done on 10/20/2016. The  patient has never been a smoker. She has been seen by Dr. Corene Cornea dew the vascular surgeon who most recently saw her on 06/15/2017 for evaluation of ongoing problems with right leg swelling. She had a lower extremity arterial duplex examination done(02/13/17) which showed patent distal right superficial femoral artery stent and above-the- knee popliteal stent without evidence of restenosis. The ABI was more than 1.3 on the right and more than 1.3 on the left. This was consistent with noncompressible arteries due to medial calcification. The right great toe pressure and PPG waveforms are within normal limits and the left great toe pressure and PPG waveforms are decreased. he recommended she continue to wear her compression stockings and continue with elevation. She is scheduled to have a noninvasive arterial study in the near future 08/16/2017 -- had a lower extremity arterial duplex examination done which showed patent distal right superficial femoral artery stent and above-the-knee popliteal stent without evidence of restenosis. The ABI was more than 1.3 on the right and more than 1.3 on the left. This was consistent with noncompressible arteries due to medial calcification. The right great toe pressure and PPG waveforms are within normal limits and the left great toe pressure and PPG waveforms are decreased. the x-ray of the right  ankle has not yet been done 08/24/2017 -- had a right ankle x-ray -- IMPRESSION:1. No fracture, bone lesion or evidence of osteomyelitis. 2. Lateral soft tissue swelling with a soft tissue ulcer. RAPHAEL, ESPE ([id number]) she has not yet seen the vascular surgeon for review 08/31/17 on evaluation today patient's wound appears to be showing signs of improvement. She still with her appointment with vascular in order to review her results of her vascular study and then determine if any intervention would be recommended at that time. No fevers, chills, nausea, or vomiting noted at this time. She has been tolerating the dressing changes without complication. Electronic Signature(s) Signed: 08/31/2017 5:13:37 PM By: Worthy Keeler PA-C Entered By: Worthy Keeler on 08/31/2017 15:52:35 Tobey, [name] ([id number]) -------------------------------------------------------------------------------- Physical Exam Details Patient Name: [name] Date of Service: 08/31/2017 2:30 PM Medical Record Number: [id number] Patient Account Number: [id number] Date of Birth/Sex: July 29, 1923 (83 y.o. Female) Treating RN: Ahmed Prima Primary Care Provider: FITZGERALD, DAVID Other Clinician: Referring Provider: FITZGERALD, DAVID Treating Provider/Extender: STONE III, HOYT Weeks in Treatment: 3 Constitutional Well-nourished and well-hydrated in no acute distress. Respiratory normal breathing without difficulty. Cardiovascular trace pitting edema of the bilateral lower extremities. Psychiatric this patient is able to make decisions and demonstrates good insight into disease process. Alert and Oriented x 3. pleasant and cooperative. Notes Patient's wound did require some debridement this was performed today without complication. Patient had only minimal discomfort. There is no evidence of infection in the surrounding periwound. Electronic Signature(s) Signed: 08/31/2017 5:13:37 PM By: Worthy Keeler PA-C Entered By: Worthy Keeler on 08/31/2017 15:53:51 Soucek, [name] ([id number]) -------------------------------------------------------------------------------- Physician Orders Details Patient Name: [name] Date of Service: 08/31/2017 2:30 PM Medical Record Number: [id number] Patient Account Number: [id number] Date of Birth/Sex: 1923-09-17 (83 y.o. 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 o Cleanse wound with mild soap and water o May Shower, gently pat wound dry prior to applying new dressing. Anesthetic Wound #1 Right,Lateral Malleolus o Topical Lidocaine 4% cream applied to wound bed prior to debridement - for clinic use Skin Barriers/Peri-Wound Care Wound #1 Right,Lateral Malleolus o Skin Prep Primary Wound Dressing Wound #1 Right,Lateral Malleolus o Santyl Ointment Secondary Dressing Wound #1 Right,Lateral Malleolus o Dry Gauze o Non-adherent pad - telfa island Dressing Change Frequency Wound #1 Right,Lateral Malleolus o Change dressing every day. Follow-up Appointments ROSELA, SUPAK ([id number]) Wound #1 Right,Lateral Malleolus o Return Appointment in 1 week. Edema Control Wound #1 Right,Lateral Malleolus o Elevate legs to the level of the heart and pump ankles as often as possible o Other: - wear your compression stockings Off-Loading Wound #1  Right,Lateral Malleolus o Turn and reposition every 2 hours o Other: - do not put pressure on your right ankle Additional Orders / Instructions Wound #1 Right,Lateral Malleolus o Increase protein intake. o Other: - Add Vitamin C, Zinc, Vitamin A to your diet Electronic Signature(s) Signed: 08/31/2017 5:13:37 PM By: Worthy Keeler PA-C Signed: 09/03/2017 9:42:14 AM By: Alric Quan Entered By: Alric Quan on 08/31/2017 14:52:52 Diekmann, [name] ([id number]) -------------------------------------------------------------------------------- Problem List Details Patient Name: [name] Date of Service: 08/31/2017 2:30 PM Medical Record Number: [id number] Patient Account Number: [id number] Date of Birth/Sex: 1923/03/01 (83 y.o. Female) Treating RN: Ahmed Prima Primary Care Provider: FITZGERALD, DAVID Other Clinician: Referring Provider: FITZGERALD, DAVID Treating Provider/Extender: STONE III, HOYT Weeks in Treatment: 3 Active Problems ICD-10 Encounter Code Description Active Date Diagnosis L97.312 Non-pressure chronic ulcer of right ankle with fat layer 08/10/2017 Yes exposed I70.233 Atherosclerosis of native arteries of right leg with 08/10/2017 Yes ulceration of ankle I89.0 Lymphedema, not elsewhere classified 08/10/2017 Yes Inactive Problems Resolved Problems Electronic Signature(s) Signed: 08/31/2017 5:13:37 PM By: Worthy Keeler PA-C Entered By: Worthy Keeler on 08/31/2017 14:44:51 Schaper, [name] ([id number]) -------------------------------------------------------------------------------- Progress Note Details Patient Name: [name] Date of Service: 08/31/2017 2:30 PM Medical Record Number: [id number] Patient Account Number: [id number] Date of Birth/Sex: [birth year] (83 y.o. Female) Treating RN: Ahmed Prima Primary Care Provider: FITZGERALD, DAVID Other Clinician: Referring Provider: FITZGERALD, DAVID Treating Provider/Extender:  STONE III, HOYT Weeks in Treatment: 3 Subjective Chief Complaint Information obtained from Patient Patient presents to the wound care center for a consult due non healing wound to the right lateral malleolus History of Present Illness (HPI) The following HPI elements were documented for the patient's wound: Location: Patient presents with an ulcer on the right lateral malleolus Quality: Patient reports experiencing a dull pain to affected area(s). Severity: Patient states wound are getting worse. Duration: Patient has had the wound for >2 years prior to seeking treatment at the wound center Timing: Pain in wound is constant (hurts all the time) Context: The wound would happen gradually Modifying Factors: Other treatment(s) tried include:seen a podiatrist on a vascular surgeons and under treatment with them Associated Signs and Symptoms: Patient reports having increase swelling. 83 year old patient who most recently has been seeing both podiatry and vascular surgery for a long- standing ulcer of her right lateral malleolus which has been treated with various methodologies. Dr. Amalia Hailey the podiatrist saw her on 07/20/2017 and sent her to the wound center for possible hyperbaric oxygen therapy. past medical history of peripheral vascular disease, varicose veins, status post appendectomy, basal cell carcinoma excision from the left leg, cholecystectomy,  pacemaker placement, right lower extremity angiography done by Dr. dew in March 2017 with placement of a stent. there is also note of a successful ablation of the right small saphenous vein done which was reviewed by ultrasound on 10/24/2016. the patient had a right small saphenous vein ablation done on 10/20/2016. The patient has never been a smoker. She has been seen by Dr. Corene Cornea dew the vascular surgeon who most recently saw her on 06/15/2017 for evaluation of ongoing problems with right leg swelling. She had a lower extremity arterial  duplex examination done(02/13/17) which showed patent distal right superficial femoral artery stent and above-the- knee popliteal stent without evidence of restenosis. The ABI was more than 1.3 on the right and more than 1.3 on the left. This was consistent with noncompressible arteries due to medial calcification. The right great toe pressure and PPG waveforms are within normal limits and the left great toe pressure and PPG waveforms are decreased. he recommended she continue to wear her compression stockings and continue with elevation. She is scheduled to have a noninvasive arterial study in the near future 08/16/2017 -- had a lower extremity arterial duplex examination done which showed patent distal right superficial femoral artery stent and above-the-knee popliteal stent without evidence of restenosis. The ABI Walters, RAMIE PALLADINO ([id number]) was more than 1.3 on the right and more than 1.3 on the left. This was consistent with noncompressible arteries due to medial calcification. The right great toe pressure and PPG waveforms are within normal limits and the left great toe pressure and PPG waveforms are decreased. the x-ray of the right ankle has not yet been done 08/24/2017 -- had a right ankle x-ray -- IMPRESSION:1. No fracture, bone lesion or evidence of osteomyelitis. 2. Lateral soft tissue swelling with a soft tissue ulcer. she has not yet seen the vascular surgeon for review 08/31/17 on evaluation today patient's wound appears to be showing signs of improvement. She still with her appointment with vascular in order to review her results of her vascular study and then determine if any intervention would be recommended at that time. No fevers, chills, nausea, or vomiting noted at this time. She has been tolerating the dressing changes without complication. Objective Constitutional Well-nourished and well-hydrated in no acute distress. Vitals Time Taken: 2:36 PM, Height: 65 in,  Weight: 154.3 lbs, BMI: 25.7, Temperature: 97.9 F, Pulse: 91 bpm, Respiratory Rate: 18 breaths/min, Blood Pressure: 138/71 mmHg. Respiratory normal breathing without difficulty. Cardiovascular trace pitting edema of the bilateral lower extremities. Psychiatric this patient is able to make decisions and demonstrates good insight into disease process. Alert and Oriented x 3. pleasant and cooperative. General Notes: Patient's wound did require some debridement this was performed today without complication. Patient had only minimal discomfort. There is no evidence of infection in the surrounding periwound. Integumentary (Hair, Skin) Wound #1 status is Open. Original cause of wound was Gradually Appeared. The wound is located on the Right,Lateral Malleolus. The wound measures 1.2cm length x 1.4cm width x 0.2cm depth; 1.319cm^2 area and 0.264cm^3 volume. There is no tunneling or undermining noted. There is a large amount of serous Funderburk, STACI DACK. ([id number]) drainage noted. The wound margin is distinct with the outline attached to the wound base. There is medium (34-66%) red granulation within the wound bed. There is a medium (34-66%) amount of necrotic tissue within the wound bed including Adherent Slough. The periwound skin appearance exhibited: Maceration, Erythema. The surrounding wound skin color is noted with erythema which is circumferential. Periwound temperature  was noted as No Abnormality. The periwound has tenderness on palpation. Assessment Active Problems ICD-10 L97.312 - 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d I70.233 - Atherosclerosis of native arteries of right leg with ulceration of ankle I89.0 - Lymphedema, not elsewhere classified Procedures Wound #1 Pre-procedure diagnosis of Wound #1 is a Lymphedema located on the Right,Lateral Malleolus .Severity of Tissue Pre Debridement is: Fat layer exposed. There was a Skin/Subcutaneous Tissue  Debridement ([id number]) debridement with total area of 1.68 sq cm performed by STONE III, HOYT E., PA-C. with the following instrument(s): Curette to remove Viable and Non-Viable tissue/material including Exudate, Fibrin/Slough, and Subcutaneous after achieving pain control using Lidocaine 4% Topical Solution. A time out was conducted at 14:49, prior to the start of the procedure. A Minimum amount of bleeding was controlled with Pressure. The procedure was tolerated well with a pain level of 0 throughout and a pain level of 0 following the procedure. Post Debridement Measurements: 1.2cm length x 1.4cm width x 0.3cm depth; 0.396cm^3 volume. Character of Wound/Ulcer Post Debridement requires further debridement. Severity of Tissue Post Debridement is: Fat layer exposed. Post procedure Diagnosis Wound #1: Same as Pre-Procedure Plan Wound Cleansing: Wound #1 Right,Lateral Malleolus: Clean wound with Normal Saline. JIMI, GIZA ([id number]) Cleanse wound with mild soap and water May Shower, gently pat wound dry prior to applying new dressing. Anesthetic: Wound #1 Right,Lateral Malleolus: Topical Lidocaine 4% cream applied to wound bed prior to debridement - for clinic use Skin Barriers/Peri-Wound Care: Wound #1 Right,Lateral Malleolus: Skin Prep Primary Wound Dressing: Wound #1 Right,Lateral Malleolus: Santyl Ointment Secondary Dressing: Wound #1 Right,Lateral Malleolus: Dry Gauze Non-adherent pad - telfa island Dressing Change Frequency: Wound #1 Right,Lateral Malleolus: Change dressing every day. Follow-up Appointments: Wound #1 Right,Lateral Malleolus: Return Appointment in 1 week. Edema Control: Wound #1 Right,Lateral Malleolus: Elevate legs to the level of the heart and pump ankles as often as possible Other: - wear your compression stockings Off-Loading: Wound #1 Right,Lateral Malleolus: Turn and reposition every 2 hours Other: - do not put pressure on your right  ankle Additional Orders / Instructions: Wound #1 Right,Lateral Malleolus: Increase protein intake. Other: - Add Vitamin C, Zinc, Vitamin A to your diet Please see above for specific wound care orders. I'm going to recommend that we continue with the Santyl which I think is helping significantly with patient's wounds in general. We will see were things stand following. She will be seen for reevaluation in two weeks time. Electronic Signature(s) Signed: 08/31/2017 5:13:37 PM By: Worthy Keeler PA-C Entered By: Worthy Keeler on 08/31/2017 15:54:49 Sigmon, [name] ([id number]) -------------------------------------------------------------------------------- SuperBill Details Patient Name: [name] Date of Service: 08/31/2017 Medical Record Number: [id number] Patient Account Number: [id number] Date of Birth/Sex: 09/22/1923 (83 y.o. Female) Treating RN: Ahmed Prima Primary Care Provider: FITZGERALD, DAVID Other Clinician: Referring Provider: FITZGERALD, DAVID Treating Provider/Extender: STONE III, HOYT Weeks in Treatment: 3 Diagnosis Coding ICD-10 Codes Code Description H82.993 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d I70.233 Atherosclerosis of native arteries of right leg with ulceration of ankle I89.0 Lymphedema, not elsewhere classified Facility Procedures CPT4 Code Description: [id number] 11042 - DEB SUBQ TISSUE 20 SQ CM/< ICD-10 Description Diagnosis L97.312 Non-pressure chronic ulcer of right ankle with fat l Modifier: ayer expose Quantity: 1 d Physician Procedures CPT4 Code Description: [id number] 11042 - WC PHYS SUBQ TISS 20 SQ CM ICD-10 Description Diagnosis L97.312 Non-pressure chronic ulcer of right ankle with fat l Modifier: ayer expose Quantity: 1 d  Electronic Signature(s) Signed: 08/31/2017 5:13:37 PM By: Worthy Keeler PA-C Entered By: Worthy Keeler on 08/31/2017 15:55:04

## 2017-09-03 NOTE — Progress Notes (Signed)
[name], [name] ([id number]) Visit Report for 08/31/2017 Arrival Information Details Patient Name: [name], [name] Date of Service: 08/31/2017 2:30 PM Medical Record Number: [id number] Patient Account Number: [id number] Date of Birth/Sex: Aug 08, 1923 (83 y.o. Female) Treating RN: Ahmed Prima Primary Care Luster Hechler: FITZGERALD, DAVID Other Clinician: Referring Tryphena Perkovich: FITZGERALD, DAVID Treating Tagen Milby/Extender: STONE III, HOYT Weeks in Treatment: 3 Visit Information History Since Last Visit All ordered tests and consults were completed: No Patient Arrived: Ambulatory Added or deleted any medications: No Arrival Time: 14:36 Any new allergies or adverse reactions: No Accompanied By: daughter Had a fall or experienced change in No Transfer Assistance: None activities of daily living that may affect Patient Identification Verified: Yes risk of falls: Secondary Verification Process Yes Signs or symptoms of abuse/neglect since last No Completed: visito Patient Requires Transmission-Based No Hospitalized since last visit: No Precautions: Has Dressing in Place as Prescribed: Yes Patient Has Alerts: No Pain Present Now: No Electronic Signature(s) Signed: 09/03/2017 9:42:14 AM By: Alric Quan Entered By: Alric Quan on 08/31/2017 14:36:41 [name], [name] ([id number]) -------------------------------------------------------------------------------- Encounter Discharge Information Details Patient Name: [name] Date of Service: 08/31/2017 2:30 PM Medical Record Number: [id number] Patient Account Number: [id number] Date of Birth/Sex: 1923/11/19 (83 y.o. Female) Treating RN: Ahmed Prima Primary Care Gibson Lad: FITZGERALD, DAVID Other Clinician: Referring Ilias Stcharles: FITZGERALD, DAVID Treating Makel Mcmann/Extender: STONE III, HOYT Weeks in Treatment: 3 Encounter Discharge Information Items Discharge Pain Level: 0 Discharge Condition: Stable Ambulatory  Status: Ambulatory Discharge Destination: Home Transportation: Private Auto Accompanied By: daughter Schedule Follow-up Appointment: Yes Medication Reconciliation completed and provided to Patient/Care No [place]: Provided on Clinical Summary of Care: 08/31/2017 Form Type Recipient Paper Patient WS Electronic Signature(s) Signed: 09/03/2017 9:00:05 AM By: Ruthine Dose Entered By: Ruthine Dose on 08/31/2017 15:00:55 [name], [name] ([id number]) -------------------------------------------------------------------------------- Lower Extremity Assessment Details Patient Name: [name] Date of Service: 08/31/2017 2:30 PM Medical Record Number: [id number] Patient Account Number: [id number] Date of Birth/Sex: 01/20/1923 (83 y.o. Female) Treating RN: Ahmed Prima Primary Care Jayliani Wanner: FITZGERALD, DAVID Other Clinician: Referring Barba Solt: FITZGERALD, DAVID Treating Neeka Urista/Extender: STONE III, HOYT Weeks in Treatment: 3 Edema Assessment Assessed: [Left: No] [Right: No] E[Left: dema] [Right: :] Calf Left: Right: Point of Measurement: 32 cm From Medial Instep cm cm Ankle Left: Right: Point of Measurement: 11 cm From Medial Instep cm cm Vascular Assessment Pulses: Dorsalis Pedis Palpable: [Right:Yes] Posterior Tibial Extremity colors, hair growth, and conditions: Extremity Color: [Right:Hyperpigmented] Temperature of Extremity: [Right:Warm] Capillary Refill: [Right:< 3 seconds] Toe Nail Assessment Left: Right: Thick: Yes Discolored: Yes Deformed: No Improper Length and Hygiene: No Electronic Signature(s) Signed: 09/03/2017 9:42:14 AM By: Alric Quan Entered By: Alric Quan on 08/31/2017 14:43:00 [name], [name] ([id number]) -------------------------------------------------------------------------------- Multi Wound Chart Details Patient Name: [name] Date of Service: 08/31/2017 2:30 PM Medical Record Number: [id number] Patient  Account Number: [id number] Date of Birth/Sex: 06/20/1923 (83 y.o. Female) Treating RN: Ahmed Prima Primary Care Lokelani Lutes: FITZGERALD, DAVID Other Clinician: Referring Deaundre Allston: FITZGERALD, DAVID Treating Assata Juncaj/Extender: STONE III, HOYT Weeks in Treatment: 3 Vital Signs Height(in): 65 Pulse(bpm): 91 Weight(lbs): 154.3 Blood Pressure 138/71 (mmHg): Body Mass Index(BMI): 26 Temperature(F): 97.9 Respiratory Rate 18 (breaths/min): Photos: [1:No Photos] [N/A:N/A] Wound Location: [1:Right Malleolus - Lateral N/A] Wounding Event: [1:Gradually Appeared] [N/A:N/A] Primary Etiology: [1:Lymphedema] [N/A:N/A] Secondary Etiology: [1:Arterial Insufficiency Ulcer N/A] Comorbid History: [1:Cataracts, Congestive Heart Failure, Hypertension, Osteoarthritis, Neuropathy] [N/A:N/A] Date Acquired: [1:08/11/2015] [N/A:N/A] Weeks of Treatment: [1:3] [N/A:N/A] Wound Status: [1:Open] [N/A:N/A] Measurements L x W x D 1.2x1.4x0.2 [N/A:N/A] (cm) Area (cm) : [  1:1.319] [N/A:N/A] Volume (cm) : [1:0.264] [N/A:N/A] % Reduction in Area: [1:13.90%] [N/A:N/A] % Reduction in Volume: 42.50% [N/A:N/A] Classification: [1:Partial Thickness] [N/A:N/A] Exudate Amount: [1:Large] [N/A:N/A] Exudate Type: [1:Serous] [N/A:N/A] Exudate Color: [1:amber] [N/A:N/A] Wound Margin: [1:Distinct, outline attached N/A] Granulation Amount: [1:Medium (34-66%)] [N/A:N/A] Granulation Quality: [1:Red] [N/A:N/A] Necrotic Amount: [1:Medium (34-66%)] [N/A:N/A] Epithelialization: [1:None] [N/A:N/A] Periwound Skin Texture: No Abnormalities Noted N/A [1:Maceration: Yes] [N/A:N/A] Periwound Skin Moisture: Periwound Skin Color: Erythema: Yes N/A N/A Erythema Location: Circumferential N/A N/A Temperature: No Abnormality N/A N/A Tenderness on Yes N/A N/A Palpation: Wound Preparation: Ulcer Cleansing: N/A N/A Rinsed/Irrigated with Saline Topical Anesthetic Applied: Other: lidocaine 4% Treatment Notes Electronic  Signature(s) Signed: 09/03/2017 9:42:14 AM By: Alric Quan Entered By: Alric Quan on 08/31/2017 14:43:58 [name], [name] ([id number]) -------------------------------------------------------------------------------- [place] Details Patient Name: [name] Date of Service: 08/31/2017 2:30 PM Medical Record Number: [id number] Patient Account Number: [id number] Date of Birth/Sex: Nov 27, 1923 (83 y.o. Female) Treating RN: Ahmed Prima Primary Care Tatym Schermer: FITZGERALD, DAVID Other Clinician: Referring Teryl Mcconaghy: FITZGERALD, DAVID Treating Mackinzie Vuncannon/Extender: STONE III, HOYT Weeks in Treatment: 3 Active Inactive ` Abuse / Safety / Falls / Self Care Management Nursing Diagnoses: Potential for falls Goals: Patient will not experience any injury related to falls Date Initiated: 08/10/2017 Target Resolution Date: 11/10/2017 Goal Status: Active Interventions: Assess Activities of Daily Living upon admission and as needed Assess fall risk on admission and as needed Assess: immobility, friction, shearing, incontinence upon admission and as needed Notes: ` Nutrition Nursing Diagnoses: Imbalanced nutrition Potential for alteratiion in Nutrition/Potential for imbalanced nutrition Goals: Patient/caregiver agrees to and verbalizes understanding of need to use nutritional supplements and/or vitamins as prescribed Date Initiated: 08/10/2017 Target Resolution Date: 12/08/2017 Goal Status: Active Interventions: Assess patient nutrition upon admission and as needed per policy Notes: ` Orientation to the Wound Care Program [name], [name] ([id number]) Nursing Diagnoses: Knowledge deficit related to the wound healing center program Goals: Patient/caregiver will verbalize understanding of the [place] Date Initiated: 08/10/2017 Target Resolution Date: 09/08/2017 Goal Status: Active Interventions: Provide education on orientation to  the wound center Notes: ` Pain, Acute or Chronic Nursing Diagnoses: Pain, acute or chronic: actual or potential Potential alteration in comfort, pain Goals: Patient/caregiver will verbalize adequate pain control between visits Date Initiated: 08/10/2017 Target Resolution Date: 12/08/2017 Goal Status: Active Interventions: Complete pain assessment as per visit requirements Notes: ` Wound/Skin Impairment Nursing Diagnoses: Impaired tissue integrity Knowledge deficit related to ulceration/compromised skin integrity Goals: Ulcer/skin breakdown will have a volume reduction of 80% by week 12 Date Initiated: 08/10/2017 Target Resolution Date: 12/01/2017 Goal Status: Active Interventions: Assess patient/caregiver ability to perform ulcer/skin care regimen upon admission and as needed Notes: [name], [name] ([id number]) Electronic Signature(s) Signed: 09/03/2017 9:42:14 AM By: Alric Quan Entered By: Alric Quan on 08/31/2017 14:43:51 Maron, [name] ([id number]) -------------------------------------------------------------------------------- Pain Assessment Details Patient Name: [name] Date of Service: 08/31/2017 2:30 PM Medical Record Number: [id number] Patient Account Number: [id number] Date of Birth/Sex: 22-Jul-1923 (83 y.o. Female) Treating RN: Ahmed Prima Primary Care Anmol Paschen: FITZGERALD, DAVID Other Clinician: Referring Makyla Bye: FITZGERALD, DAVID Treating Ronni Osterberg/Extender: STONE III, HOYT Weeks in Treatment: 3 Active Problems Location of Pain Severity and Description of Pain Patient Has Paino No Site Locations Pain Management and Medication Current Pain Management: Electronic Signature(s) Signed: 09/03/2017 9:42:14 AM By: Alric Quan Entered By: Alric Quan on 08/31/2017 14:36:46 Warrior, [name] ([id number]) -------------------------------------------------------------------------------- Patient/Caregiver Education  Details Patient Name: [name] Date of Service: 08/31/2017 2:30 PM Medical Record  Number: [id number] Patient Account Number: [id number] Date of Birth/Gender: [birth year] (83 y.o. Female) Treating RN: Ahmed Prima Primary Care Physician: FITZGERALD, DAVID Other Clinician: Referring Physician: FITZGERALD, DAVID Treating Physician/Extender: Melburn Hake, HOYT Weeks in Treatment: 3 Education Assessment Education Provided To: Patient Education Topics Provided Wound/Skin Impairment: Handouts: Other: change dressing as ordered Methods: Demonstration, Explain/Verbal Responses: State content correctly Electronic Signature(s) Signed: 09/03/2017 9:42:14 AM By: Alric Quan Entered By: Alric Quan on 08/31/2017 14:47:04 Trebilcock, [name] ([id number]) -------------------------------------------------------------------------------- Wound Assessment Details Patient Name: [name] Date of Service: 08/31/2017 2:30 PM Medical Record Number: [id number] Patient Account Number: [id number] Date of Birth/Sex: 06-May-1923 (83 y.o. Female) Treating RN: Ahmed Prima Primary Care Velena Keegan: FITZGERALD, DAVID Other Clinician: Referring Aarion Kittrell: FITZGERALD, DAVID Treating Lanaiya Lantry/Extender: STONE III, HOYT Weeks in Treatment: 3 Wound Status Wound Number: 1 Primary Lymphedema Etiology: Wound Location: Right Malleolus - Lateral Secondary Arterial Insufficiency Ulcer Wounding Event: Gradually Appeared Etiology: Date Acquired: 08/11/2015 Wound Open Weeks Of Treatment: 3 Status: Clustered Wound: No Comorbid Cataracts, Congestive Heart Failure, History: Hypertension, Osteoarthritis, Neuropathy Photos Photo Uploaded By: Alric Quan on 08/31/2017 15:26:26 Wound Measurements Length: (cm) 1.2 % Reduction i Width: (cm) 1.4 % Reduction i Depth: (cm) 0.2 Epithelializa Area: (cm) 1.319 Tunneling: Volume: (cm) 0.264 Undermining: n Area: 13.9% n Volume: 42.5% tion:  None No No Wound Description Classification: Partial Thickness Foul Odor Aft Wound Margin: Distinct, outline attached Slough/Fibrin Exudate Amount: Large Exudate Type: Serous Exudate Color: amber er Cleansing: No o Yes Wound Bed Granulation Amount: Medium (34-66%) Granulation Quality: Red Hansman, [name] ([id number]) Necrotic Amount: Medium (34-66%) Necrotic Quality: Adherent Slough Periwound Skin Texture Texture Color No Abnormalities Noted: No No Abnormalities Noted: No Erythema: Yes Moisture Erythema Location: Circumferential No Abnormalities Noted: No Maceration: Yes Temperature / Pain Temperature: No Abnormality Tenderness on Palpation: Yes Wound Preparation Ulcer Cleansing: Rinsed/Irrigated with Saline Topical Anesthetic Applied: Other: lidocaine 4%, Treatment Notes Wound #1 (Right, Lateral Malleolus) 1. Cleansed with: Clean wound with Normal Saline 2. Anesthetic Topical Lidocaine 4% cream to wound bed prior to debridement 4. Dressing Applied: Santyl Ointment 5. Secondary Dressing Applied [place] Signature(s) Signed: 09/03/2017 9:42:14 AM By: Alric Quan Entered By: Alric Quan on 08/31/2017 14:41:43 [place], [name] ([id number]) -------------------------------------------------------------------------------- [place] Details Patient Name: [name] Date of Service: 08/31/2017 2:30 PM Medical Record Number: [id number] Patient Account Number: [id number] Date of Birth/Sex: 12-Aug-1923 (83 y.o. Female) Treating RN: Ahmed Prima Primary Care Aggie Douse: FITZGERALD, DAVID Other Clinician: Referring Thresa Dozier: FITZGERALD, DAVID Treating Heinrich Fertig/Extender: STONE III, HOYT Weeks in Treatment: 3 Vital Signs Time Taken: 14:36 Temperature (F): 97.9 Height (in): 65 Pulse (bpm): 91 Weight (lbs): 154.3 Respiratory Rate (breaths/min): 18 Body Mass Index (BMI): 25.7 Blood Pressure (mmHg): 138/71 Reference Range:  80 - 120 mg / dl Electronic Signature(s) Signed: 09/03/2017 9:42:14 AM By: Alric Quan Entered By: Alric Quan on 08/31/2017 14:38:09

## 2017-09-14 ENCOUNTER — Encounter: Payer: Medicare Other | Attending: Surgery | Admitting: Surgery

## 2017-09-14 DIAGNOSIS — Z950 Presence of cardiac pacemaker: Secondary | ICD-10-CM | POA: Insufficient documentation

## 2017-09-14 DIAGNOSIS — I509 Heart failure, unspecified: Secondary | ICD-10-CM | POA: Insufficient documentation

## 2017-09-14 DIAGNOSIS — M1990 Unspecified osteoarthritis, unspecified site: Secondary | ICD-10-CM | POA: Insufficient documentation

## 2017-09-14 DIAGNOSIS — I70233 Atherosclerosis of native arteries of right leg with ulceration of ankle: Secondary | ICD-10-CM | POA: Diagnosis present

## 2017-09-14 DIAGNOSIS — Z85828 Personal history of other malignant neoplasm of skin: Secondary | ICD-10-CM | POA: Diagnosis not present

## 2017-09-14 DIAGNOSIS — I110 Hypertensive heart disease with heart failure: Secondary | ICD-10-CM | POA: Insufficient documentation

## 2017-09-14 DIAGNOSIS — Z79899 Other long term (current) drug therapy: Secondary | ICD-10-CM | POA: Diagnosis not present

## 2017-09-14 DIAGNOSIS — G629 Polyneuropathy, unspecified: Secondary | ICD-10-CM | POA: Diagnosis not present

## 2017-09-14 DIAGNOSIS — Z7982 Long term (current) use of aspirin: Secondary | ICD-10-CM | POA: Diagnosis not present

## 2017-09-14 DIAGNOSIS — L97312 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d: Secondary | ICD-10-CM | POA: Diagnosis not present

## 2017-09-14 DIAGNOSIS — I890 Lymphedema, not elsewhere classified: Secondary | ICD-10-CM | POA: Diagnosis not present

## 2017-09-16 NOTE — Progress Notes (Signed)
[name], [name] ([id number]) Visit Report for 09/14/2017 Chief Complaint Document Details Patient Name: [name], [name] 09/14/2017 12:30 Date of Service: PM Medical Record [id number] Number: Patient Account Number: [id number] 22-Dec-1922 (83 y.o. Treating RN: Ahmed Prima Date of Birth/Sex: Female) Other Clinician: Primary Care Provider: FITZGERALD, DAVID Treating Mako Pelfrey Referring Provider: FITZGERALD, DAVID Provider/Extender: [name] in Treatment: 5 Information Obtained from: Patient Chief Complaint Patient presents to the wound care center for a consult due non healing wound to the right lateral malleolus Electronic Signature(s) Signed: 09/14/2017 1:06:44 PM By: Christin Fudge MD, FACS Entered By: Christin Fudge on 09/14/2017 13:06:43 [name], [name] ([id number]) -------------------------------------------------------------------------------- Debridement Details Patient Name: [name] 09/14/2017 12:30 Date of Service: PM Medical Record [id number] Number: Patient Account Number: [id number] 1923/03/14 (83 y.o. Treating RN: Ahmed Prima Date of Birth/Sex: Female) Other Clinician: Primary Care Provider: FITZGERALD, DAVID Treating Mikyah Alamo Referring Provider: FITZGERALD, DAVID Provider/Extender: Weeks in Treatment: 5 Debridement Performed for Wound #1 Right,Lateral Malleolus Assessment: Performed By: Physician Christin Fudge, MD Debridement: Debridement Severity of Tissue Pre Fat layer exposed Debridement: Pre-procedure Verification/Time Out Yes - 12:49 Taken: Start Time: 12:50 Pain Control: Lidocaine 4% Topical Solution Level: Skin/Subcutaneous Tissue Total Area Debrided (L x 1.7 (cm) x 1.1 (cm) = 1.87 (cm) W): Tissue and other Viable, Non-Viable, Exudate, Fibrin/Slough, Subcutaneous material debrided: Instrument: Curette Bleeding: Minimum Hemostasis Achieved: Pressure End Time: 12:53 Procedural Pain: 0 Post Procedural  Pain: 0 Response to Treatment: Procedure was tolerated well Post Debridement Measurements of Total Wound Length: (cm) 1.7 Width: (cm) 1.1 Depth: (cm) 0.2 Volume: (cm) 0.294 Character of Wound/Ulcer Post Requires Further Debridement Debridement: Severity of Tissue Post Debridement: Fat layer exposed Post Procedure Diagnosis Same as Pre-procedure Electronic Signature(s) Wonder, Donaway [name] ([id number]) Signed: 09/14/2017 1:06:38 PM By: Christin Fudge MD, FACS Signed: 09/14/2017 4:03:52 PM By: Alric Quan Entered By: Christin Fudge on 09/14/2017 13:06:38 [name], [name] ([id number]) -------------------------------------------------------------------------------- HPI Details Patient Name: [name] 09/14/2017 12:30 Date of Service: PM Medical Record [id number] Number: Patient Account Number: [id number] [birth year] (83 y.o. Treating RN: Ahmed Prima Date of Birth/Sex: Female) Other Clinician: Primary Care Provider: FITZGERALD, DAVID Treating Lalisa Kiehn Referring Provider: FITZGERALD, DAVID Provider/Extender: Weeks in Treatment: 5 History of Present Illness Location: Patient presents with an ulcer on the right lateral malleolus Quality: Patient reports experiencing a dull pain to affected area(s). Severity: Patient states wound are getting worse. Duration: Patient has had the wound for >2 years prior to seeking treatment at the wound center Timing: Pain in wound is constant (hurts all the time) Context: The wound would happen gradually Modifying Factors: Other treatment(s) tried include:seen a podiatrist on a vascular surgeons and under treatment with them Associated Signs and Symptoms: Patient reports having increase swelling. 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 Dr. Amalia Hailey the  podiatrist saw her on 07/20/2017 and sent her to the wound center for possible hyperbaric oxygen therapy. past medical history of peripheral vascular disease, varicose veins, status post appendectomy, basal cell carcinoma excision from the left leg, cholecystectomy, pacemaker placement, right lower extremity angiography done by Dr. dew in March 2017 with placement of a stent. there is also note of a successful ablation of the right small saphenous vein done which was reviewed by ultrasound on 10/24/2016. the patient had a right small saphenous vein ablation done on 10/20/2016. The patient has never been a smoker.  She has been seen by Dr. Corene Cornea dew the vascular surgeon who most recently saw her on 06/15/2017 for evaluation of ongoing problems with right leg swelling. She had a lower extremity arterial duplex examination done(02/13/17) which showed patent distal right superficial femoral artery stent and above-the- knee popliteal stent without evidence of restenosis. The ABI was more than 1.3 on the right and more than 1.3 on the left. This was consistent with noncompressible arteries due to medial calcification. The right great toe pressure and PPG waveforms are within normal limits and the left great toe pressure and PPG waveforms are decreased. he recommended she continue to wear her compression stockings and continue with elevation. She is scheduled to have a noninvasive arterial study in the near future 08/16/2017 -- had a lower extremity arterial duplex examination done which showed patent distal right superficial femoral artery stent and above-the-knee popliteal stent without evidence of restenosis. The ABI was more than 1.3 on the right and more than 1.3 on the left. This was consistent with noncompressible arteries due to medial calcification. The right great toe pressure and PPG waveforms are within normal limits and the left great toe pressure and PPG waveforms are decreased. the x-ray  of the right ankle has not yet been done [name], [name] ([id number]) 08/24/2017 -- had a right ankle x-ray -- IMPRESSION:1. No fracture, bone lesion or evidence of osteomyelitis. 2. Lateral soft tissue swelling with a soft tissue ulcer. she has not yet seen the vascular surgeon for review 08/31/17 on evaluation today patient's wound appears to be showing signs of improvement. She still with her appointment with vascular in order to review her results of her vascular study and then determine if any intervention would be recommended at that time. No fevers, chills, nausea, or vomiting noted at this time. She has been tolerating the dressing changes without complication. Electronic Signature(s) Signed: 09/14/2017 1:07:20 PM By: Christin Fudge MD, FACS Entered By: Christin Fudge on 09/14/2017 13:07:20 [name] ([id number]) -------------------------------------------------------------------------------- Physical Exam Details Patient Name: [name], [name] 09/14/2017 12:30 Date of Service: PM Medical Record [id number] Number: Patient Account Number: [id number] 02-18-1923 (83 y.o. Treating RN: Ahmed Prima Date of Birth/Sex: Female) Other Clinician: Primary Care Provider: FITZGERALD, DAVID Treating Gurnie Duris Referring Provider: FITZGERALD, DAVID Provider/Extender: Weeks in Treatment: 5 Constitutional . Pulse regular. Respirations normal and unlabored. Afebrile. . Eyes Nonicteric. Reactive to light. Ears, Nose, Mouth, and Throat Lips, teeth, and gums WNL.Marland Kitchen Moist mucosa without lesions. Neck supple and nontender. No palpable supraclavicular or cervical adenopathy. Normal sized without goiter. Respiratory WNL. No retractions.. Cardiovascular Pedal Pulses WNL. No clubbing, cyanosis or edema. Lymphatic No adneopathy. No adenopathy. No adenopathy. Musculoskeletal Adexa without tenderness or enlargement.. Digits and nails w/o clubbing, cyanosis, infection,  petechiae, ischemia, or inflammatory conditions.. Integumentary (Hair, Skin) No suspicious lesions. No crepitus or fluctuance. No peri-wound warmth or erythema. No masses.Marland Kitchen Psychiatric Judgement and insight Intact.. No evidence of depression, anxiety, or agitation.. Notes sharp debridement was done with a #3 curet and minimal bleeding controlled with pressure Electronic Signature(s) Signed: 09/14/2017 1:07:40 PM By: Christin Fudge MD, FACS Entered By: Christin Fudge on 09/14/2017 13:07:39 [name], [name] ([id number]) -------------------------------------------------------------------------------- Physician Orders Details Patient Name: [name] 09/14/2017 12:30 Date of Service: PM Medical Record [id number] Number: Patient Account Number: [id number] August 27, 1923 (83 y.o. Treating RN: Ahmed Prima Date of Birth/Sex: Female) Other Clinician: Primary Care Provider: FITZGERALD, DAVID Treating Garland Smouse Referring Provider: FITZGERALD, DAVID Provider/Extender: [name] in Treatment: 5 Verbal / Phone Orders: Yes  Clinician: Carolyne Fiscal, Debi Read Back and Verified: Yes Diagnosis Coding Wound Cleansing Wound #1 Right,Lateral Malleolus o Clean wound with Normal Saline. o Cleanse wound with mild soap and water o May Shower, gently pat wound dry prior to applying new dressing. Anesthetic Wound #1 Right,Lateral Malleolus o Topical Lidocaine 4% cream applied to wound bed prior to debridement - for clinic use Skin Barriers/Peri-Wound Care Wound #1 Right,Lateral Malleolus o Skin Prep Primary Wound Dressing Wound #1 Right,Lateral Malleolus o Santyl Ointment Secondary Dressing Wound #1 Right,Lateral Malleolus o Dry Gauze o Non-adherent pad - telfa island Dressing Change Frequency Wound #1 Right,Lateral Malleolus o Change dressing every day. Follow-up Appointments Wound #1 Right,Lateral Malleolus o Return Appointment in 1 week. Edema Control Wound #1  Right,Lateral Malleolus [name], [name] ([id number]) o Elevate legs to the level of the heart and pump ankles as often as possible o Other: - wear your compression stockings Off-Loading Wound #1 Right,Lateral Malleolus o Turn and reposition every 2 hours o Other: - do not put pressure on your right ankle Additional Orders / Instructions Wound #1 Right,Lateral Malleolus o Increase protein intake. o Other: - Add Vitamin C, Zinc, Vitamin A to your diet Electronic Signature(s) Signed: 09/14/2017 4:03:52 PM By: Alric Quan Signed: 09/14/2017 4:18:43 PM By: Christin Fudge MD, FACS Entered By: Alric Quan on 09/14/2017 12:53:40 [name], [name] ([id number]) -------------------------------------------------------------------------------- Problem List Details Patient Name: [name], [name] 09/14/2017 12:30 Date of Service: PM Medical Record [id number] Number: Patient Account Number: [id number] 03-21-1923 (83 y.o. 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/10/2017 Yes exposed I70.233 Atherosclerosis of native arteries of right leg with 08/10/2017 Yes ulceration of ankle I89.0 Lymphedema, not elsewhere classified 08/10/2017 Yes Inactive Problems Resolved Problems Electronic Signature(s) Signed: 09/14/2017 1:06:26 PM By: Christin Fudge MD, FACS Entered By: Christin Fudge on 09/14/2017 13:06:25 [name] ([id number]) -------------------------------------------------------------------------------- Progress Note Details Patient Name: [name] 09/14/2017 12:30 Date of Service: PM Medical Record [id number] Number: Patient Account  Number: [id number] Oct 18, 1923 (83 y.o. Treating RN: Ahmed Prima Date of Birth/Sex: Female) Other Clinician: Primary Care Provider: FITZGERALD, DAVID Treating Josejuan Hoaglin Referring Provider: FITZGERALD, DAVID Provider/Extender: Weeks in Treatment: 5 Subjective Chief Complaint Information obtained from Patient Patient presents to the wound care center for a consult due non healing wound to the right lateral malleolus History of Present Illness (HPI) The following HPI elements were documented for the patient's wound: Location: Patient presents with an ulcer on the right lateral malleolus Quality: Patient reports experiencing a dull pain to affected area(s). Severity: Patient states wound are getting worse. Duration: Patient has had the wound for >2 years prior to seeking treatment at the wound center Timing: Pain in wound is constant (hurts all the time) Context: The wound would happen gradually Modifying Factors: Other treatment(s) tried include:seen a podiatrist on a vascular surgeons and under treatment with them Associated Signs and Symptoms: Patient reports having increase swelling. 83 year old patient who most recently has been seeing both podiatry and vascular surgery for a long- standing ulcer of her right lateral malleolus which has been treated with various methodologies. Dr. Amalia Hailey the podiatrist saw her on 07/20/2017 and sent her to the wound center for possible hyperbaric oxygen therapy. past medical history of peripheral vascular disease, varicose veins, status post appendectomy, basal cell  carcinoma excision from the left leg, cholecystectomy, pacemaker placement, right lower extremity angiography done by Dr. dew in March 2017 with placement of a stent. there is also note of a successful ablation of the right small saphenous vein done which was reviewed by ultrasound on 10/24/2016. the patient had a right small saphenous vein ablation done on 10/20/2016. The patient has  never been a smoker. She has been seen by Dr. Corene Cornea dew the vascular surgeon who most recently saw her on 06/15/2017 for evaluation of ongoing problems with right leg swelling. She had a lower extremity arterial duplex examination done(02/13/17) which showed patent distal right superficial femoral artery stent and above-the- knee popliteal stent without evidence of restenosis. The ABI was more than 1.3 on the right and more than 1.3 on the left. This was consistent with noncompressible arteries due to medial calcification. The right great toe pressure and PPG waveforms are within normal limits and the left great toe pressure and PPG waveforms are decreased. he recommended she continue to wear her compression stockings and continue with elevation. She is scheduled to have a noninvasive arterial study in the near future [name], [name] ([id number]) 08/16/2017 -- had a lower extremity arterial duplex examination done which showed patent distal right superficial femoral artery stent and above-the-knee popliteal stent without evidence of restenosis. The ABI was more than 1.3 on the right and more than 1.3 on the left. This was consistent with noncompressible arteries due to medial calcification. The right great toe pressure and PPG waveforms are within normal limits and the left great toe pressure and PPG waveforms are decreased. the x-ray of the right ankle has not yet been done 08/24/2017 -- had a right ankle x-ray -- IMPRESSION:1. No fracture, bone lesion or evidence of osteomyelitis. 2. Lateral soft tissue swelling with a soft tissue ulcer. she has not yet seen the vascular surgeon for review 08/31/17 on evaluation today patient's wound appears to be showing signs of improvement. She still with her appointment with vascular in order to review her results of her vascular study and then determine if any intervention would be recommended at that time. No fevers, chills, nausea, or vomiting noted  at this time. She has been tolerating the dressing changes without complication. Objective Constitutional Pulse regular. Respirations normal and unlabored. Afebrile. Vitals Time Taken: 12:40 PM, Height: 65 in, Weight: 154.3 lbs, BMI: 25.7, Temperature: 97.9 F, Pulse: 87 bpm, Respiratory Rate: 18 breaths/min, Blood Pressure: 141/76 mmHg. Eyes Nonicteric. Reactive to light. Ears, Nose, Mouth, and Throat Lips, teeth, and gums WNL.Marland Kitchen Moist mucosa without lesions. Neck supple and nontender. No palpable supraclavicular or cervical adenopathy. Normal sized without goiter. Respiratory WNL. No retractions.. Cardiovascular Pedal Pulses WNL. No clubbing, cyanosis or edema. Lymphatic Moore, [name] ([id number]) No adneopathy. No adenopathy. No adenopathy. Musculoskeletal Adexa without tenderness or enlargement.. Digits and nails w/o clubbing, cyanosis, infection, petechiae, ischemia, or inflammatory conditions.Marland Kitchen Psychiatric Judgement and insight Intact.. No evidence of depression, anxiety, or agitation.. General Notes: sharp debridement was done with a #3 curet and minimal bleeding controlled with pressure Integumentary (Hair, Skin) No suspicious lesions. No crepitus or fluctuance. No peri-wound warmth or erythema. No masses.. Wound #1 status is Open. Original cause of wound was Gradually Appeared. The wound is located on the Right,Lateral Malleolus. The wound measures 1.7cm length x 1.1cm width x 0.2cm depth; 1.469cm^2 area and 0.294cm^3 volume. There is no tunneling or undermining noted. There is a large amount of serous drainage noted. The wound margin is distinct with  the outline attached to the wound base. There is large (67-100%) red granulation within the wound bed. There is a small (1-33%) amount of necrotic tissue within the wound bed including Adherent Slough. The periwound skin appearance exhibited: Maceration, Erythema. The surrounding wound skin color is noted with erythema  which is circumferential. Periwound temperature was noted as No Abnormality. The periwound has tenderness on palpation. Assessment Active Problems ICD-10 L97.312 - 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d I70.233 - Atherosclerosis of native arteries of right leg with ulceration of ankle I89.0 - Lymphedema, not elsewhere classified Procedures Wound #1 Pre-procedure diagnosis of Wound #1 is a Lymphedema located on the Right,Lateral Malleolus .Severity of Tissue Pre Debridement is: Fat layer exposed. There was a Skin/Subcutaneous Tissue Debridement ([id number]) debridement with total area of 1.87 sq cm performed by Christin Fudge, MD. with the following instrument(s): Curette to remove Viable and Non-Viable tissue/material including Exudate, Fibrin/Slough, and Subcutaneous after achieving pain control using Lidocaine 4% Topical Solution. A time out was conducted at 12:49, prior to the start of the procedure. A Minimum amount of bleeding was controlled with [name], [name]. ([id number]) Pressure. The procedure was tolerated well with a pain level of 0 throughout and a pain level of 0 following the procedure. Post Debridement Measurements: 1.7cm length x 1.1cm width x 0.2cm depth; 0.294cm^3 volume. Character of Wound/Ulcer Post Debridement requires further debridement. Severity of Tissue Post Debridement is: Fat layer exposed. Post procedure Diagnosis Wound #1: Same as Pre-Procedure Plan Wound Cleansing: Wound #1 Right,Lateral Malleolus: Clean wound with Normal Saline. Cleanse wound with mild soap and water May Shower, gently pat wound dry prior to applying new dressing. Anesthetic: Wound #1 Right,Lateral Malleolus: Topical Lidocaine 4% cream applied to wound bed prior to debridement - for clinic use Skin Barriers/Peri-Wound Care: Wound #1 Right,Lateral Malleolus: Skin Prep Primary Wound Dressing: Wound #1 Right,Lateral Malleolus: Santyl Ointment Secondary  Dressing: Wound #1 Right,Lateral Malleolus: Dry Gauze Non-adherent pad - telfa island Dressing Change Frequency: Wound #1 Right,Lateral Malleolus: Change dressing every day. Follow-up Appointments: Wound #1 Right,Lateral Malleolus: Return Appointment in 1 week. Edema Control: Wound #1 Right,Lateral Malleolus: Elevate legs to the level of the heart and pump ankles as often as possible Other: - wear your compression stockings Off-Loading: Wound #1 Right,Lateral Malleolus: Turn and reposition every 2 hours Other: - do not put pressure on your right ankle Additional Orders / Instructions: Wound #1 Right,Lateral Malleolus: Increase protein intake. Other: - Add Vitamin C, Zinc, Vitamin A to your diet LEXANDRA, RETTKE ([id number]) After sharp debridement and a review, I have recommended: 1. Santyl ointment locally to be applied with a bordered foam after washing with soap and water 2. Continue to wear her compression stockings as before 3. elevation and exercise discussed with her in great detail 4. appointment to see the vascular surgeons pending -- end of this month 5. Regular visits to the wound center Electronic Signature(s) Signed: 09/14/2017 1:08:31 PM By: Christin Fudge MD, FACS Entered By: Christin Fudge on 09/14/2017 13:08:31 Ramus, [name] ([id number]) -------------------------------------------------------------------------------- Niland Details Patient Name: [name] Date of Service: 09/14/2017 Medical Record Number: [id number] Patient Account Number: [id number] Date of Birth/Sex: 01-26-1923 (83 y.o. Female) Treating RN: Ahmed Prima Primary Care Provider: FITZGERALD, DAVID Other Clinician: Referring Provider: FITZGERALD, DAVID Treating Provider/Extender: Frann Rider in Treatment: 5 Diagnosis Coding ICD-10 Codes Code Description H82.993 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d I70.233 Atherosclerosis of native  arteries of right leg with ulceration  of ankle I89.0 Lymphedema, not elsewhere classified Facility Procedures CPT4 Code Description: [id number] 11042 - DEB SUBQ TISSUE 20 SQ CM/< ICD-10 Description Diagnosis L97.312 Non-pressure chronic ulcer of right ankle with fat la I70.233 Atherosclerosis of native arteries of right leg with I89.0 Lymphedema, not elsewhere  classified Modifier: yer exposed ulceration of Quantity: 1 ankle Physician Procedures CPT4 Code Description: [id number] 11042 - WC PHYS SUBQ TISS 20 SQ CM ICD-10 Description Diagnosis L97.312 Non-pressure chronic ulcer of right ankle with fat la I70.233 Atherosclerosis of native arteries of right leg with I89.0 Lymphedema, not elsewhere  classified Modifier: yer exposed ulceration of a Quantity: 1 nkle Electronic Signature(s) Signed: 09/14/2017 1:08:40 PM By: Christin Fudge MD, FACS Entered By: Christin Fudge on 09/14/2017 13:08:40

## 2017-09-16 NOTE — Progress Notes (Signed)
[name], [name] ([id number]) Visit Report for 09/14/2017 Arrival Information Details Patient Name: [name], [name] Date of Service: 09/14/2017 12:30 PM Medical Record Number: [id number] Patient Account Number: [id number] Date of Birth/Sex: 1923-10-31 (83 y.o. Female) Treating RN: Ahmed Prima Primary Care Heron Pitcock: FITZGERALD, DAVID Other Clinician: Referring Vilas Edgerly: FITZGERALD, DAVID Treating [name]: Frann Rider in Treatment: 5 Visit Information History Since Last Visit All ordered tests and consults were completed: No Patient Arrived: Ambulatory Added or deleted any medications: No Arrival Time: 12:36 Any new allergies or adverse reactions: No Accompanied By: self Had a fall or experienced change in No Transfer Assistance: None activities of daily living that may affect Patient Identification Verified: Yes risk of falls: Secondary Verification Process Yes Signs or symptoms of abuse/neglect since last No Completed: visito Patient Requires Transmission-Based No Hospitalized since last visit: No Precautions: Has Dressing in Place as Prescribed: Yes Patient Has Alerts: No Pain Present Now: No Electronic Signature(s) Signed: 09/14/2017 4:03:52 PM By: Alric Quan Entered By: Alric Quan on 09/14/2017 12:40:06 Beharry, [name] ([id number]) -------------------------------------------------------------------------------- Encounter Discharge Information Details Patient Name: [name] Date of Service: 09/14/2017 12:30 PM Medical Record Number: [id number] Patient Account Number: [id number] Date of Birth/Sex: 1923-03-27 (83 y.o. Female) Treating RN: Ahmed Prima Primary Care Lori Liew: FITZGERALD, DAVID Other Clinician: Referring Ronny Korff: FITZGERALD, DAVID Treating Letti Towell/Extender: Frann Rider in Treatment: 5 Encounter Discharge Information Items Discharge Pain Level: 0 Discharge Condition: Stable Ambulatory  Status: Ambulatory Discharge Destination: Home Transportation: Private Auto Accompanied By: self Schedule Follow-up Appointment: Yes Medication Reconciliation completed and provided to Patient/Care No Ellisa Devivo: Provided on Clinical Summary of Care: 09/14/2017 Form Type Recipient Paper Patient WS Electronic Signature(s) Signed: 09/14/2017 4:03:52 PM By: Alric Quan Entered By: Alric Quan on 09/14/2017 12:59:51 Mckissic, [name] ([id number]) -------------------------------------------------------------------------------- Lower Extremity Assessment Details Patient Name: [name] Date of Service: 09/14/2017 12:30 PM Medical Record Number: [id number] Patient Account Number: [id number] Date of Birth/Sex: March 07, 1923 (83 y.o. Female) Treating RN: Ahmed Prima Primary Care Eliel Dudding: FITZGERALD, DAVID Other Clinician: Referring Evaluna Utke: FITZGERALD, DAVID Treating Romana Deaton/Extender: Frann Rider in Treatment: 5 Vascular Assessment Pulses: Dorsalis Pedis Palpable: [Right:Yes] Posterior Tibial Extremity colors, hair growth, and conditions: Extremity Color: [Right:Hyperpigmented] Temperature of Extremity: [Right:Warm] Capillary Refill: [Right:< 3 seconds] Toe Nail Assessment Left: Right: Thick: Yes Discolored: Yes Deformed: No Improper Length and Hygiene: No Electronic Signature(s) Signed: 09/14/2017 4:03:52 PM By: Alric Quan Entered By: Alric Quan on 09/14/2017 12:46:46 [name], [name] ([id number]) -------------------------------------------------------------------------------- Multi Wound Chart Details Patient Name: [name] Date of Service: 09/14/2017 12:30 PM Medical Record Number: [id number] Patient Account Number: [id number] Date of Birth/Sex: 03-28-1923 (83 y.o. Female) Treating RN: Ahmed Prima Primary Care Lillyonna Armstead: FITZGERALD, DAVID Other Clinician: Referring Niles Ess: FITZGERALD, DAVID Treating  Elsye Mccollister/Extender: Frann Rider in Treatment: 5 Vital Signs Height(in): 65 Pulse(bpm): 87 Weight(lbs): 154.3 Blood Pressure 141/76 (mmHg): Body Mass Index(BMI): 26 Temperature(F): 97.9 Respiratory Rate 18 (breaths/min): Photos: [1:No Photos] [N/A:N/A] Wound Location: [1:Right Malleolus - Lateral N/A] Wounding Event: [1:Gradually Appeared] [N/A:N/A] Primary Etiology: [1:Lymphedema] [N/A:N/A] Secondary Etiology: [1:Arterial Insufficiency Ulcer N/A] Comorbid History: [1:Cataracts, Congestive Heart Failure, Hypertension, Osteoarthritis, Neuropathy] [N/A:N/A] Date Acquired: [1:08/11/2015] [N/A:N/A] Weeks of Treatment: [1:5] [N/A:N/A] Wound Status: [1:Open] [N/A:N/A] Measurements L x W x D 1.7x1.1x0.2 [N/A:N/A] (cm) Area (cm) : [1:1.469] [N/A:N/A] Volume (cm) : [1:0.294] [N/A:N/A] % Reduction in Area: [1:4.10%] [N/A:N/A] % Reduction in Volume: 35.90% [N/A:N/A] Classification: [1:Partial Thickness] [N/A:N/A] Exudate Amount: [1:Large] [N/A:N/A] Exudate Type: [1:Serous] [N/A:N/A] Exudate Color: [1:amber] [N/A:N/A] Wound Margin: [1:Distinct, outline attached N/A]  Granulation Amount: [1:Large (67-100%)] [N/A:N/A] Granulation Quality: [1:Red] [N/A:N/A] Necrotic Amount: [1:Small (1-33%)] [N/A:N/A] Epithelialization: [1:None] [N/A:N/A] Debridement: [1:Debridement ([id number]- [id number])] [N/A:N/A] Pre-procedure 12:49 N/A N/A Verification/Time Out Taken: Pain Control: Lidocaine 4% Topical N/A N/A Solution Tissue Debrided: Fibrin/Slough, Exudates, N/A N/A Subcutaneous Level: Skin/Subcutaneous N/A N/A Tissue Debridement Area (sq 1.87 N/A N/A cm): Instrument: Curette N/A N/A Bleeding: Minimum N/A N/A Hemostasis Achieved: Pressure N/A N/A Procedural Pain: 0 N/A N/A Post Procedural Pain: 0 N/A N/A Debridement Treatment Procedure was tolerated N/A N/A Response: well Post Debridement 1.7x1.1x0.2 N/A N/A Measurements L x W x D (cm) Post Debridement 0.294 N/A N/A Volume:  (cm) Periwound Skin Texture: No Abnormalities Noted N/A N/A Periwound Skin Maceration: Yes N/A N/A Moisture: Periwound Skin Color: Erythema: Yes N/A N/A Erythema Location: Circumferential N/A N/A Temperature: No Abnormality N/A N/A Tenderness on Yes N/A N/A Palpation: Wound Preparation: Ulcer Cleansing: N/A N/A Rinsed/Irrigated with Saline Topical Anesthetic Applied: Other: lidocaine 4% Procedures Performed: Debridement N/A N/A Treatment Notes Wound #1 (Right, Lateral Malleolus) 1. Cleansed with: Clean wound with Normal Saline 2. Anesthetic Topical Lidocaine 4% cream to wound bed prior to debridement 4. Dressing Applied: Santyl Ointment 5. Secondary Dressing Applied [name], [name] ([id number]) [place] Signature(s) Signed: 09/14/2017 1:06:31 PM By: Christin Fudge MD, FACS Entered By: Christin Fudge on 09/14/2017 13:06:31 [name] ([id number]) -------------------------------------------------------------------------------- [place] Details Patient Name: [name], [name] Date of Service: 09/14/2017 12:30 PM Medical Record Number: [id number] Patient Account Number: [id number] Date of Birth/Sex: 01-23-1923 (83 y.o. Female) Treating RN: Ahmed Prima Primary Care Klaire Court: FITZGERALD, DAVID Other Clinician: Referring Sabrine Patchen: FITZGERALD, DAVID Treating Zakya Halabi/Extender: Frann Rider in Treatment: 5 Active Inactive ` Abuse / Safety / Falls / Self Care Management Nursing Diagnoses: Potential for falls Goals: Patient will not experience any injury related to falls Date Initiated: 08/10/2017 Target Resolution Date: 11/10/2017 Goal Status: Active Interventions: Assess Activities of Daily Living upon admission and as needed Assess fall risk on admission and as needed Assess: immobility, friction, shearing, incontinence upon admission and as needed Notes: ` Nutrition Nursing Diagnoses: Imbalanced  nutrition Potential for alteratiion in Nutrition/Potential for imbalanced nutrition Goals: Patient/caregiver agrees to and verbalizes understanding of need to use nutritional supplements and/or vitamins as prescribed Date Initiated: 08/10/2017 Target Resolution Date: 12/08/2017 Goal Status: Active Interventions: Assess patient nutrition upon admission and as needed per policy Notes: ` Orientation to the Wound Care Program [name], [name] ([id number]) Nursing Diagnoses: Knowledge deficit related to the wound healing center program Goals: Patient/caregiver will verbalize understanding of the [facility] Date Initiated: 08/10/2017 Target Resolution Date: 09/08/2017 Goal Status: Active Interventions: Provide education on orientation to the wound center Notes: ` Pain, Acute or Chronic Nursing Diagnoses: Pain, acute or chronic: actual or potential Potential alteration in comfort, pain Goals: Patient/caregiver will verbalize adequate pain control between visits Date Initiated: 08/10/2017 Target Resolution Date: 12/08/2017 Goal Status: Active Interventions: Complete pain assessment as per visit requirements Notes: ` Wound/Skin Impairment Nursing Diagnoses: Impaired tissue integrity Knowledge deficit related to ulceration/compromised skin integrity Goals: Ulcer/skin breakdown will have a volume reduction of 80% by week 12 Date Initiated: 08/10/2017 Target Resolution Date: 12/01/2017 Goal Status: Active Interventions: Assess patient/caregiver ability to perform ulcer/skin care regimen upon admission and as needed Notes: [name], [name] ([id number]) Electronic Signature(s) Signed: 09/14/2017 4:03:52 PM By: Alric Quan Entered By: Alric Quan on 09/14/2017 12:49:28 [name], [name] ([id number]) -------------------------------------------------------------------------------- Pain Assessment Details Patient Name: [name] Date of Service:  09/14/2017 12:30 PM  Medical Record Number: [id number] Patient Account Number: [id number] Date of Birth/Sex: 12/17/1922 (83 y.o. Female) Treating RN: Ahmed Prima Primary Care Kordae Buonocore: FITZGERALD, DAVID Other Clinician: Referring Kathrin Folden: FITZGERALD, DAVID Treating Keita Demarco/Extender: Frann Rider in Treatment: 5 Active Problems Location of Pain Severity and Description of Pain Patient Has Paino No Site Locations Pain Management and Medication Current Pain Management: Electronic Signature(s) Signed: 09/14/2017 4:03:52 PM By: Alric Quan Entered By: Alric Quan on 09/14/2017 12:40:11 [name], [name] ([id number]) -------------------------------------------------------------------------------- Patient/Caregiver Education Details Patient Name: [name] Date of Service: 09/14/2017 12:30 PM Medical Record Number: [id number] Patient Account Number: [id number] Date of Birth/Gender: 1923-01-09 (83 y.o. Female) Treating RN: Ahmed Prima Primary Care Physician: FITZGERALD, DAVID Other Clinician: Referring Physician: FITZGERALD, DAVID Treating Physician/Extender: Frann Rider in Treatment: 5 Education Assessment Education Provided To: Patient Education Topics Provided Wound/Skin Impairment: Handouts: Other: change dressing as ordered Methods: Demonstration, Explain/Verbal Responses: State content correctly Electronic Signature(s) Signed: 09/14/2017 4:03:52 PM By: Alric Quan Entered By: Alric Quan on 09/14/2017 13:00:40 [name], [name] ([id number]) -------------------------------------------------------------------------------- Wound Assessment Details Patient Name: [name] Date of Service: 09/14/2017 12:30 PM Medical Record Number: [id number] Patient Account Number: [id number] Date of Birth/Sex: 27-Dec-1922 (83 y.o. Female) Treating RN: Ahmed Prima Primary Care Jex Strausbaugh: FITZGERALD, DAVID Other  Clinician: Referring Deem Marmol: FITZGERALD, DAVID Treating Bright Spielmann/Extender: Frann Rider in Treatment: 5 Wound Status Wound Number: 1 Primary Lymphedema Etiology: Wound Location: Right Malleolus - Lateral Secondary Arterial Insufficiency Ulcer Wounding Event: Gradually Appeared Etiology: Date Acquired: 08/11/2015 Wound Open Weeks Of Treatment: 5 Status: Clustered Wound: No Comorbid Cataracts, Congestive Heart Failure, History: Hypertension, Osteoarthritis, Neuropathy Photos Photo Uploaded By: Alric Quan on 09/14/2017 15:51:27 Wound Measurements Length: (cm) 1.7 % Reduction i Width: (cm) 1.1 % Reduction i Depth: (cm) 0.2 Epithelializa Area: (cm) 1.469 Tunneling: Volume: (cm) 0.294 Undermining: n Area: 4.1% n Volume: 35.9% tion: None No No Wound Description Classification: Partial Thickness Foul Odor Aft Wound Margin: Distinct, outline attached Slough/Fibrin Exudate Amount: Large Exudate Type: Serous Exudate Color: amber er Cleansing: No o Yes Wound Bed Granulation Amount: Large (67-100%) Granulation Quality: Red Niehoff, [name] ([id number]) Necrotic Amount: Small (1-33%) Necrotic Quality: Adherent Slough Periwound Skin Texture Texture Color No Abnormalities Noted: No No Abnormalities Noted: No Erythema: Yes Moisture Erythema Location: Circumferential No Abnormalities Noted: No Maceration: Yes Temperature / Pain Temperature: No Abnormality Tenderness on Palpation: Yes Wound Preparation Ulcer Cleansing: Rinsed/Irrigated with Saline Topical Anesthetic Applied: Other: lidocaine 4%, Treatment Notes Wound #1 (Right, Lateral Malleolus) 1. Cleansed with: Clean wound with Normal Saline 2. Anesthetic Topical Lidocaine 4% cream to wound bed prior to debridement 4. Dressing Applied: Santyl Ointment 5. Secondary [place] Signature(s) Signed: 09/14/2017 4:03:52 PM By: Alric Quan Entered By:  Alric Quan on 09/14/2017 12:46:27 Siedlecki, [name] ([id number]) -------------------------------------------------------------------------------- [place] Details Patient Name: [name] Date of Service: 09/14/2017 12:30 PM Medical Record Number: [id number] Patient Account Number: [id number] Date of Birth/Sex: 1922-12-26 (83 y.o. Female) Treating RN: Ahmed Prima Primary Care Cordae Mccarey: FITZGERALD, DAVID Other Clinician: Referring Launa Goedken: FITZGERALD, DAVID Treating Daritza Brees/Extender: Frann Rider in Treatment: 5 Vital Signs Time Taken: 12:40 Temperature (F): 97.9 Height (in): 65 Pulse (bpm): 87 Weight (lbs): 154.3 Respiratory Rate (breaths/min): 18 Body Mass Index (BMI): 25.7 Blood Pressure (mmHg): 141/76 Reference Range: 80 - 120 mg / dl Electronic Signature(s) Signed: 09/14/2017 4:03:52 PM By: Alric Quan Entered By: Alric Quan on 09/14/2017 12:40:29

## 2017-09-28 ENCOUNTER — Ambulatory Visit (INDEPENDENT_AMBULATORY_CARE_PROVIDER_SITE_OTHER): Payer: Medicare Other

## 2017-09-28 ENCOUNTER — Encounter (INDEPENDENT_AMBULATORY_CARE_PROVIDER_SITE_OTHER): Payer: Self-pay | Admitting: Vascular Surgery

## 2017-09-28 ENCOUNTER — Ambulatory Visit (INDEPENDENT_AMBULATORY_CARE_PROVIDER_SITE_OTHER): Payer: Medicare Other | Admitting: Vascular Surgery

## 2017-09-28 ENCOUNTER — Encounter: Payer: Medicare Other | Admitting: Physician Assistant

## 2017-09-28 VITALS — BP 127/76 | HR 92 | Resp 16 | Ht 66.0 in | Wt 156.0 lb

## 2017-09-28 DIAGNOSIS — I890 Lymphedema, not elsewhere classified: Secondary | ICD-10-CM

## 2017-09-28 DIAGNOSIS — I739 Peripheral vascular disease, unspecified: Secondary | ICD-10-CM

## 2017-09-28 DIAGNOSIS — I83013 Varicose veins of right lower extremity with ulcer of ankle: Secondary | ICD-10-CM

## 2017-09-28 DIAGNOSIS — I70233 Atherosclerosis of native arteries of right leg with ulceration of ankle: Secondary | ICD-10-CM | POA: Diagnosis not present

## 2017-09-28 DIAGNOSIS — L97311 Non-pressure chronic ulcer of right ankle limited to breakdown of skin: Secondary | ICD-10-CM

## 2017-09-28 DIAGNOSIS — E785 Hyperlipidemia, unspecified: Secondary | ICD-10-CM | POA: Diagnosis not present

## 2017-09-28 DIAGNOSIS — L97319 Non-pressure chronic ulcer of right ankle with unspecified severity: Secondary | ICD-10-CM | POA: Diagnosis not present

## 2017-09-28 LAB — VAS US LOWER EXTREMITY ARTERIAL DUPLEX
RIGHT ANT DIST TIBAL SYS PSV: 72 cm/s
RIGHT POST TIB DIST SYS: 101 cm/s
RPOPDPSV: -88 cm/s
RSFMPSV: 177 cm/s
RSFPPSV: -134 cm/s
Right peroneal sys PSV: 70 cm/s
Right popliteal prox sys PSV: 123 cm/s
Right super femoral dist sys PSV: -105 cm/s

## 2017-09-28 NOTE — Progress Notes (Signed)
MRN : [id number]  [name] is a 83 y.o. (14-Aug-1923) female who presents with chief complaint of  Chief Complaint  Patient presents with  . Follow-up    48mth abi,rle art  .  History of Present Illness: Patient returns today in follow up of PAD.  She has a persistent wound on the right ankle area.  This has decreased in size and has been getting excellent local wound care by the wound care center.  She has undergone both arterial and venous intervention to improve wound healing.  Noninvasive studies today demonstrate a normal right ABI 2 with brisk triphasic waveforms consistent with no arterial insufficiency including normal digital pressures.  Duplex shows a patent right distal SFA stent with minimally elevated velocities in the proximal SFA which are not hemodynamically significant.  This is not changed or worsened since her study 6 months ago.  Current Outpatient Prescriptions  Medication Sig Dispense Refill  . acetaminophen (TYLENOL) 650 MG CR tablet Take 650 mg by mouth every 8 (eight) hours as needed for pain.    Marland Kitchen aspirin EC 81 MG tablet Take 81 mg by mouth daily.    . Biotin 1000 MCG tablet Take 1,000 mcg by mouth daily.    . calcium carbonate (OSCAL) 1500 (600 Ca) MG TABS tablet Take 600 mg of elemental calcium by mouth daily.    Marland Kitchen CARTIA XT 300 MG 24 hr capsule Take 300 mg by mouth daily.     . Cholecalciferol (VITAMIN D) 2000 units CAPS Take 2,000 Units by mouth daily.    . dabigatran (PRADAXA) 150 MG CAPS capsule Take 150 mg by mouth 2 (two) times daily.    . fluticasone (FLONASE) 50 MCG/ACT nasal spray Place into the nose.    . furosemide (LASIX) 40 MG tablet Take 40 mg by mouth daily.    . Glucosamine 500 MG CAPS Take 500 mg by mouth daily.    Marland Kitchen levothyroxine (SYNTHROID, LEVOTHROID) 50 MCG tablet Take 50 mcg by mouth daily before breakfast.     . losartan (COZAAR) 50 MG tablet Take 50 mg by mouth daily.    . metoprolol (LOPRESSOR) 50 MG tablet Take 50 mg by mouth 2  (two) times daily.    . Multiple Vitamin (MULTIVITAMIN WITH MINERALS) TABS tablet Take 1 tablet by mouth daily.    . Multiple Vitamins-Minerals (PRESERVISION/LUTEIN PO) Take 1 capsule by mouth daily.    . mupirocin ointment (BACTROBAN) 2 % Apply to wound after soaking BID 30 g 1  . NONFORMULARY OR COMPOUNDED ITEM See pharmacy note 120 each 2  . omeprazole (PRILOSEC) 20 MG capsule Take by mouth daily.     . potassium chloride SA (K-DUR,KLOR-CON) 20 MEQ tablet Take 20 mEq by mouth daily.    Marland Kitchen tolterodine (DETROL) 2 MG tablet Take 2 mg by mouth daily.    . traMADol (ULTRAM) 50 MG tablet Take 50 mg by mouth every 6 (six) hours as needed for pain.    . vitamin B-12 (CYANOCOBALAMIN) 1000 MCG tablet Take 1,000 mcg by mouth daily.    . magnesium oxide (MAG-OX) 400 MG tablet Take 400 mg by mouth daily.    Marland Kitchen oseltamivir (TAMIFLU) 75 MG capsule     . rosuvastatin (CRESTOR) 5 MG tablet Take 5 mg by mouth every Monday, Wednesday, and Friday.     No current facility-administered medications for this visit.     Past Medical History:  Diagnosis Date  . Peripheral vascular disease ([place])   .  Varicose veins     Past Surgical History:  Procedure Laterality Date  . APPENDECTOMY    . BASAL CELL CARCINOMA EXCISION Left   . BREAST CYST EXCISION Left   . CATARACT EXTRACTION Left   . CHOLECYSTECTOMY    . EYE SURGERY    . PACEMAKER PLACEMENT    . PERIPHERAL VASCULAR CATHETERIZATION Right 10/09/2016   Procedure: Lower Extremity Angiography;  Surgeon: Algernon Huxley, MD;  Location: [facility];  Service: Cardiovascular;  Laterality: Right;  . PERIPHERAL VASCULAR CATHETERIZATION  10/09/2016   Procedure: Lower Extremity Intervention;  Surgeon: Algernon Huxley, MD;  Location: [facility];  Service: Cardiovascular;;  . TONSILLECTOMY            Family History  Problem Relation Age of Onset  . Stroke Mother   . Cancer Father    No bleeding or clotting disorders  Social History       Social History  Substance Use Topics  . Smoking status: Never Smoker  . Smokeless tobacco: Not on file  . Alcohol use No   Married, lives with husband      Allergies  Allergen Reactions  . Metronidazole Nausea And Vomiting, Other (See Comments) and Rash  . Monistat [Tioconazole] Rash and Other (See Comments)  . Sulfa Antibiotics Rash and Nausea And Vomiting             REVIEW OF SYSTEMS(Negative unless checked)  Constitutional: [] Weight loss[] Fever[] Chills Cardiac:[] Chest pain[] Chest pressure[x] Palpitations [] Shortness of breath when laying flat [] Shortness of breath at rest [] Shortness of breath with exertion. Vascular: [] Pain in legs with walking[] Pain in legsat rest[] Pain in legs when laying flat [] Claudication [] Pain in feet when walking [] Pain in feet at rest [] Pain in feet when laying flat [] History of DVT [] Phlebitis [x] Swelling in legs [x] Varicose veins [x] Non-healing ulcers Pulmonary: [] Uses home oxygen [] Productive cough[] Hemoptysis [] Wheeze [] COPD [] Asthma Neurologic: [] Dizziness [] Blackouts [] Seizures [] History of stroke [] History of TIA[] Aphasia [] Temporary blindness[] Dysphagia [] Weaknessor numbness in arms [] Weakness or numbnessin legs Musculoskeletal: [] Arthritis [] Joint swelling [] Joint pain [] Low back pain Hematologic:[] Easy bruising[] Easy bleeding [] Hypercoagulable state [] Anemic [] Hepatitis Gastrointestinal:[] Blood in stool[] Vomiting blood[] Gastroesophageal reflux/heartburn[] Abdominal pain Genitourinary: [] Chronic kidney disease [] Difficulturination [] Frequenturination [] Burning with urination[] Hematuria Skin: [] Rashes [x] Ulcers [x] Wounds Psychological: [] History of anxiety[] History of major depression.    Physical Examination  BP 127/76 (BP Location: Right Arm)   Pulse 92   Resp 16   Ht 5\' 6"  (1.676 m)   Wt 70.8 kg  (156 lb)   BMI 25.18 kg/m  Gen:  WD/WN, NAD.  Appears younger than stated age Head: [place]/AT, No temporalis wasting. Ear/Nose/Throat: Hearing grossly intact, nares w/o erythema or drainage, trachea midline Eyes: Conjunctiva clear. Sclera non-icteric Neck: Supple.  No JVD.  Pulmonary:  Good air movement, no use of accessory muscles.  Cardiac: RRR Vascular:  Vessel Right Left  Radial Palpable Palpable                                    Musculoskeletal: M/S 5/5 throughout.  No deformity or atrophy.  Mild lower extremity edema. Neurologic: Sensation grossly intact in extremities.  Symmetrical.  Speech is fluent.  Psychiatric: Judgment intact, Mood & affect appropriate for pt's clinical situation. Dermatologic: Right ankle wound dressed today    Labs Recent Results (from the past 2160 hour(s))  VAS [place] LOWER EXTREMITY ARTERIAL DUPLEX     Status: None (In process)   Collection Time: 09/28/17  3:10 PM  Result Value  Ref Range   Right super femoral prox sys PSV -134 cm/s   Right super femoral mid sys PSV 177 cm/s   Right super femoral dist sys PSV -105 cm/s   Right popliteal prox sys PSV 123 cm/s   Right popliteal dist sys PSV -88 cm/s   Right peroneal sys PSV 70 cm/s   RIGHT ANT DIST TIBAL SYS PSV 72 cm/s   RIGHT POST TIB DIST SYS 101 cm/s    Radiology No results found.   Assessment/Plan Paroxysmal atrial fibrillation (HCC) On anticoagulation  Varicose veins of lower extremities with ulcer (HCC) Swelling better and wound seems to be improving. Continue compression stockings and elevation  HLD (hyperlipidemia) lipid control important in reducing the progression of atherosclerotic disease. Continue statin therapy  PVD (peripheral vascular disease) ([place]) Noninvasive studies today demonstrate a normal right ABI 2 with brisk triphasic waveforms consistent with no arterial insufficiency including normal digital pressures.  Duplex shows a patent right distal SFA stent with  minimally elevated velocities in the proximal SFA which are not hemodynamically significant.  This is not changed or worsened since her study 6 months ago. Her perfusion is normal and adequate for wound healing.  Plan to recheck this in 6 months.  Continue current medical regimen.    Leotis Pain, MD  09/28/2017 4:03 PM    This note was created with Dragon medical transcription system.  Any errors from dictation are purely unintentional

## 2017-09-28 NOTE — Patient Instructions (Signed)

## 2017-09-28 NOTE — Assessment & Plan Note (Signed)
Noninvasive studies today demonstrate a normal right ABI 2 with brisk triphasic waveforms consistent with no arterial insufficiency including normal digital pressures.  Duplex shows a patent right distal SFA stent with minimally elevated velocities in the proximal SFA which are not hemodynamically significant.  This is not changed or worsened since her study 6 months ago. Her perfusion is normal and adequate for wound healing.  Plan to recheck this in 6 months.  Continue current medical regimen.

## 2017-10-01 NOTE — Progress Notes (Signed)
RIDA, LOUDIN ([id number]) Visit Report for 09/28/2017 Chief Complaint Document Details Patient Name: [name], [name]. Date of Service: 09/28/2017 9:45 AM Medical Record Number: [id number] Patient Account Number: [id number] Date of Birth/Sex: 09/03/1923 (83 y.o. Female) Treating RN: Ahmed Prima Primary Care Provider: FITZGERALD, DAVID Other Clinician: Referring Provider: FITZGERALD, DAVID Treating Provider/Extender: STONE III, Jearline Hirschhorn Weeks in Treatment: 7 Information Obtained from: Patient Chief Complaint Patient presents to the wound care center for a consult due non healing wound to the right lateral malleolus Electronic Signature(s) Signed: 09/28/2017 5:05:22 PM By: Worthy Keeler PA-C Entered By: Worthy Keeler on 09/28/2017 10:04:18 [place], [name] ([id number]) -------------------------------------------------------------------------------- Debridement Details Patient Name: [name] Date of Service: 09/28/2017 9:45 AM Medical Record Number: [id number] Patient Account Number: [id number] Date of Birth/Sex: 1923-04-15 (83 y.o. Female) Treating RN: Ahmed Prima Primary Care Provider: FITZGERALD, DAVID Other Clinician: Referring Provider: FITZGERALD, DAVID Treating Provider/Extender: STONE III, Geoffrey Hynes Weeks in Treatment: 7 Debridement Performed for Wound #1 Right,Lateral Malleolus Assessment: Performed By: Physician STONE III, Leida Luton [name]., PA-C Debridement: Debridement Severity of Tissue Pre Fat layer exposed Debridement: Pre-procedure Verification/Time Yes - 10:09 Out Taken: Start Time: 10:10 Pain Control: Lidocaine 4% Topical Solution Level: Skin/Subcutaneous Tissue Total Area Debrided (L x W): 1.8 (cm) x 1.3 (cm) = 2.34 (cm) Tissue and other material Viable, Exudate, Fibrin/Slough, Subcutaneous debrided: Instrument: Curette Bleeding: Minimum Hemostasis Achieved: Pressure End Time: 10:13 Procedural Pain: 0 Post Procedural Pain: 0 Response  to Treatment: Procedure was tolerated well Post Debridement Measurements of Total Wound Length: (cm) 1.8 Width: (cm) 1.4 Depth: (cm) 0.3 Volume: (cm) 0.594 Character of Wound/Ulcer Post Debridement: Requires Further Debridement Severity of Tissue Post Debridement: Fat layer exposed Post Procedure Diagnosis Same as Pre-procedure Electronic Signature(s) Signed: 09/28/2017 3:57:11 PM By: Alric Quan Signed: 09/28/2017 5:05:22 PM By: Worthy Keeler PA-C Entered By: Alric Quan on 09/28/2017 10:14:53 [place]opyan, [name] ([id number]) -------------------------------------------------------------------------------- HPI Details Patient Name: [name] Date of Service: 09/28/2017 9:45 AM Medical Record Number: [id number] Patient Account Number: [id number] Date of Birth/Sex: 06/23/1923 (83 y.o. Female) Treating RN: Ahmed Prima Primary Care Provider: FITZGERALD, DAVID Other Clinician: Referring Provider: FITZGERALD, DAVID Treating Provider/Extender: STONE III, Shamaine Mulkern Weeks in Treatment: 7 History of Present Illness Location: Patient presents with an ulcer on the right lateral malleolus Quality: Patient reports experiencing a dull pain to affected area(s). Severity: Patient states wound are getting worse. Duration: Patient has had the wound for >2 years prior to seeking treatment at the wound center Timing: Pain in wound is constant (hurts all the time) Context: The wound would happen gradually Modifying Factors: Other treatment(s) tried include:seen a podiatrist on a vascular surgeons and under treatment with them Associated Signs and Symptoms: Patient reports having increase swelling. 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 Dr. Amalia Hailey the podiatrist saw her on 07/20/2017 and sent her to the wound  center for possible hyperbaric oxygen therapy. past medical history of peripheral vascular disease, varicose veins, status post appendectomy, basal cell carcinoma excision from the left leg, cholecystectomy, pacemaker placement, right lower extremity angiography done by Dr. dew in March 2017 with placement of a stent. there is also note of a successful ablation of the right small saphenous vein done which was reviewed by ultrasound on 10/24/2016. the patient had a right small saphenous vein ablation done on 10/20/2016. The patient has never been  a smoker. She has been seen by Dr. Corene Cornea dew the vascular surgeon who most recently saw her on 06/15/2017 for evaluation of ongoing problems with right leg swelling. She had a lower extremity arterial duplex examination done(02/13/17) which showed patent distal right superficial femoral artery stent and above-the-knee popliteal stent without evidence of restenosis. The ABI was more than 1.3 on the right and more than 1.3 on the left. This was consistent with noncompressible arteries due to medial calcification. The right great toe pressure and PPG waveforms are within normal limits and the left great toe pressure and PPG waveforms are decreased. he recommended she continue to wear her compression stockings and continue with elevation. She is scheduled to have a noninvasive arterial study in the near future 08/16/2017 -- had a lower extremity arterial duplex examination done which showed patent distal right superficial femoral artery stent and above-the-knee popliteal stent without evidence of restenosis. The ABI was more than 1.3 on the right and more than 1.3 on the left. This was consistent with noncompressible arteries due to medial calcification. The right great toe pressure and PPG waveforms are within normal limits and the left great toe pressure and PPG waveforms are decreased. the x-ray of the right ankle has not yet been done 08/24/2017 -- had a  right ankle x-ray -- IMPRESSION:1. No fracture, bone lesion or evidence of osteomyelitis. 2. Lateral soft tissue swelling with a soft tissue ulcer. she has not yet seen the vascular surgeon for review 08/31/17 on evaluation today patient's wound appears to be showing signs of improvement. She still with her appointment with vascular in order to review her results of her vascular study and then determine if any intervention would be recommended at that time. No fevers, chills, nausea, or vomiting noted at this time. She has been tolerating the dressing changes without complication. 09/28/17 on evaluation today patient's wound appears to show signs of good improvement in regard to the granulation tissue which is surfacing. There is still a layer of slough covering the wound and the posterior portion is still significantly deeper than the anterior nonetheless there has been some good sign of things moving towards the better. She is going to go back to Dr. dew for reevaluation to ensure her blood flow is still appropriate. That will be before her next evaluation with [place] next week. No fevers, chills, nausea, or vomiting noted at this time. Patient does have some discomfort rated to be a 3-4/10 depending on activity specifically cleansing the wound makes it worse. [name], [name] ([id number]) Electronic Signature(s) Signed: 09/28/2017 5:05:22 PM By: Worthy Keeler PA-C Entered By: Worthy Keeler on 09/28/2017 10:51:29 [name], [name] ([id number]) -------------------------------------------------------------------------------- Physical Exam Details Patient Name: [name] Date of Service: 09/28/2017 9:45 AM Medical Record Number: [id number] Patient Account Number: [id number] Date of Birth/Sex: 03-May-1923 (83 y.o. Female) Treating RN: Ahmed Prima Primary Care Provider: FITZGERALD, DAVID Other Clinician: Referring Provider: FITZGERALD, DAVID Treating Provider/Extender: STONE III,  Teneshia Hedeen Weeks in Treatment: 7 Constitutional Well-nourished and well-hydrated in no acute distress. Respiratory normal breathing without difficulty. Cardiovascular regular rate and rhythm with normal S1, S2. no bruits with no significant JVD. Psychiatric this patient is able to make decisions and demonstrates good insight into disease process. Alert and Oriented x 3. pleasant and cooperative. Notes I did carefully sharply debride the wound today due to the discomfort she was experiencing in order to not cause her any undue pain. She tolerated this well without complication. I was able to  remove a significant amount of the slough covering the wound exposing good granular tissue in the anterior surface of the wound which I'm hoping will allow it to spread and improve. The patient has what appears to be a fungal rash surrounding the right lateral ankle wound. Electronic Signature(s) Signed: 09/28/2017 5:05:22 PM By: Worthy Keeler PA-C Entered By: Worthy Keeler on 09/28/2017 10:55:43 [name], [name] ([id number]) -------------------------------------------------------------------------------- Physician Orders Details Patient Name: [name] Date of Service: 09/28/2017 9:45 AM Medical Record Number: [id number] Patient Account Number: [id number] Date of Birth/Sex: Mar 03, 1923 (83 y.o. 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 o Cleanse wound with mild soap and water o May Shower, gently pat wound dry prior to applying new dressing. Anesthetic Wound #1 Right,Lateral Malleolus o Topical Lidocaine 4% cream applied to wound bed prior to debridement - for clinic use Skin Barriers/Peri-Wound Care Wound #1 Right,Lateral Malleolus o Skin Prep o Antifungal cream Primary Wound Dressing Wound #1 Right,Lateral Malleolus o Santyl Ointment - around wound Secondary Dressing Wound #1 Right,Lateral Malleolus o Dry Gauze o Non-adherent pad - telfa island Dressing Change Frequency Wound #1 Right,Lateral Malleolus o Change dressing every day. Follow-up Appointments Wound #1 Right,Lateral Malleolus o Return Appointment in 1 week. Edema Control Wound #1 Right,Lateral Malleolus o Elevate legs to the level of the heart and pump ankles as often as possible [name], [name] ([id number]) o Other: - wear your compression stockings Off-Loading Wound #1 Right,Lateral Malleolus o Turn and reposition every 2 hours o Other: - do not put pressure on your right ankle Additional Orders / Instructions Wound #1 Right,Lateral Malleolus o Increase protein intake. o Other: - Add Vitamin C, Zinc, Vitamin A to your diet Patient Medications Allergies: sulfa, metronidazole, monistat Notifications Medication Indication Start End nystatin 09/28/2017 DOSE topical 100,000 unit/gram cream - cream topical applied to the skin surrounding the wound of the right ankle applied with dressing changes Electronic Signature(s) Signed: 09/28/2017 3:57:11 PM By: Alric Quan Signed: 09/28/2017 5:05:22 PM By: Worthy Keeler PA-C Previous Signature: 09/28/2017 10:18:43 AM Version By: Worthy Keeler PA-C Entered By: Alric Quan on 09/28/2017 10:23:51 [name], [name] ([id number]) -------------------------------------------------------------------------------- Problem List Details Patient  Name: [name] Date of Service: 09/28/2017 9:45 AM Medical Record Number: [id number] Patient Account Number: [id number] Date of Birth/Sex: 1923/02/06 (83 y.o. Female) Treating RN: Ahmed Prima Primary Care Provider: FITZGERALD, DAVID Other Clinician: Referring Provider: FITZGERALD, DAVID Treating Provider/Extender: STONE III, Tyleah Loh Weeks in Treatment: 7 Active Problems ICD-10 Encounter Code Description Active Date Diagnosis L97.312 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d 08/10/2017 Yes I70.233 Atherosclerosis of native arteries of right leg with ulceration of ankle 08/10/2017 Yes I89.0 Lymphedema, not elsewhere classified 08/10/2017 Yes B35.4 Tinea corporis 09/28/2017 Yes Inactive Problems Resolved Problems Electronic Signature(s) Signed: 09/28/2017 5:05:22 PM By: Worthy Keeler PA-C Entered By: Worthy Keeler on 09/28/2017 10:56:29 [name], [name] ([id number]) -------------------------------------------------------------------------------- Progress Note Details Patient Name: [name] Date of Service: 09/28/2017 9:45 AM Medical Record Number: [id number] Patient Account Number: [id number] Date  of Birth/Sex: 1923/07/27 (83 y.o. Female) Treating RN: Ahmed Prima Primary Care Provider: FITZGERALD, DAVID Other Clinician: Referring Provider: FITZGERALD, DAVID Treating Provider/Extender: STONE III, Dell Briner Weeks in Treatment: 7 Subjective Chief Complaint Information obtained from Patient Patient presents to the wound care center for a consult due non healing wound to the right lateral malleolus History of Present Illness (HPI) The following HPI elements were documented for the patient's wound: Location: Patient presents with an ulcer on the right lateral malleolus Quality: Patient reports experiencing a dull pain to affected area(s). Severity: Patient states wound are getting worse. Duration: Patient has had the wound for >2 years prior  to seeking treatment at the wound center Timing: Pain in wound is constant (hurts all the time) Context: The wound would happen gradually Modifying Factors: Other treatment(s) tried include:seen a podiatrist on a vascular surgeons and under treatment with them Associated Signs and Symptoms: Patient reports having increase swelling. 83 year old patient who most recently has been seeing both podiatry and vascular surgery for a long-standing ulcer of her right lateral malleolus which has been treated with various methodologies. Dr. Amalia Hailey the podiatrist saw her on 07/20/2017 and sent her to the wound center for possible hyperbaric oxygen therapy. past medical history of peripheral vascular disease, varicose veins, status post appendectomy, basal cell carcinoma excision from the left leg, cholecystectomy, pacemaker placement, right lower extremity angiography done by Dr. dew in March 2017 with placement of a stent. there is also note of a successful ablation of the right small saphenous vein done which was reviewed by ultrasound on 10/24/2016. the patient had a right small saphenous vein ablation done on 10/20/2016. The patient has never been a smoker. She has been seen by Dr. Corene Cornea dew the vascular surgeon who most recently saw her on 06/15/2017 for evaluation of ongoing problems with right leg swelling. She had a lower extremity arterial duplex examination done(02/13/17) which showed patent distal right superficial femoral artery stent and above-the-knee popliteal stent without evidence of restenosis. The ABI was more than 1.3 on the right and more than 1.3 on the left. This was consistent with noncompressible arteries due to medial calcification. The right great toe pressure and PPG waveforms are within normal limits and the left great toe pressure and PPG waveforms are decreased. he recommended she continue to wear her compression stockings and continue with elevation. She is scheduled to have  a noninvasive arterial study in the near future 08/16/2017 -- had a lower extremity arterial duplex examination done which showed patent distal right superficial femoral artery stent and above-the-knee popliteal stent without evidence of restenosis. The ABI was more than 1.3 on the right and more than 1.3 on the left. This was consistent with noncompressible arteries due to medial calcification. The right great toe pressure and PPG waveforms are within normal limits and the left great toe pressure and PPG waveforms are decreased. the x-ray of the right ankle has not yet been done 08/24/2017 -- had a right ankle x-ray -- IMPRESSION:1. No fracture, bone lesion or evidence of osteomyelitis. 2. Lateral soft tissue swelling with a soft tissue ulcer. she has not yet seen the vascular surgeon for review 08/31/17 on evaluation today patient's wound appears to be showing signs of improvement. She still with her appointment with vascular in order to review her results of her vascular study and then determine if any intervention would be recommended at that time. No fevers, chills, nausea, or vomiting noted at this time. She has been tolerating the dressing changes  without [name], [name] ([id number]) complication. 09/28/17 on evaluation today patient's wound appears to show signs of good improvement in regard to the granulation tissue which is surfacing. There is still a layer of slough covering the wound and the posterior portion is still significantly deeper than the anterior nonetheless there has been some good sign of things moving towards the better. She is going to go back to Dr. dew for reevaluation to ensure her blood flow is still appropriate. That will be before her next evaluation with [place] next week. No fevers, chills, nausea, or vomiting noted at this time. Patient does have some discomfort rated to be a 3-4/10 depending on activity specifically cleansing the wound makes it worse. Patient  History Information obtained from Patient. Family History Cancer - Father,Siblings, Heart Disease - Siblings, No family history of Diabetes, Hereditary Spherocytosis, Hypertension, Kidney Disease, Lung Disease, Seizures, Stroke, Thyroid Problems, Tuberculosis. Social History Never smoker, Marital Status - Married, Alcohol Use - Never, Drug Use - No History, Caffeine Use - Rarely. Review of Systems (ROS) Constitutional Symptoms (General Health) Denies complaints or symptoms of Fever, Chills. Respiratory The patient has no complaints or symptoms. Cardiovascular The patient has no complaints or symptoms. Psychiatric Denies complaints or symptoms of Anxiety. Objective Constitutional Well-nourished and well-hydrated in no acute distress. Vitals Time Taken: 9:53 AM, Height: 65 in, Weight: 154.3 lbs, BMI: 25.7, Temperature: 97.9 F, Pulse: 82 bpm, Respiratory Rate: 18 breaths/min, Blood Pressure: 125/65 mmHg. Respiratory normal breathing without difficulty. Cardiovascular regular rate and rhythm with normal S1, S2. no bruits with no significant JVD. Psychiatric this patient is able to make decisions and demonstrates good insight into disease process. Alert and Oriented x 3. pleasant and cooperative. General Notes: I did carefully sharply debride the wound today due to the discomfort she was experiencing in order to not [name], [name] ([id number]) cause her any undue pain. She tolerated this well without complication. I was able to remove a significant amount of the slough covering the wound exposing good granular tissue in the anterior surface of the wound which I'm hoping will allow it to spread and improve. The patient has what appears to be a fungal rash surrounding the right lateral ankle wound. Integumentary (Hair, Skin) Wound #1 status is Open. Original cause of wound was Gradually Appeared. The wound is located on the Right,Lateral Malleolus. The wound measures 1.8cm length x  1.3cm width x 0.3cm depth; 1.838cm^2 area and 0.551cm^3 volume. There is no tunneling or undermining noted. There is a large amount of serous drainage noted. The wound margin is distinct with the outline attached to the wound base. There is medium (34-66%) red granulation within the wound bed. There is a medium (34- 66%) amount of necrotic tissue within the wound bed including Adherent Slough. The periwound skin appearance exhibited: Maceration, Erythema. The surrounding wound skin color is noted with erythema which is circumferential. Periwound temperature was noted as No Abnormality. The periwound has tenderness on palpation. Assessment Active Problems ICD-10 L97.312 - 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d I70.233 - Atherosclerosis of native arteries of right leg with ulceration of ankle I89.0 - Lymphedema, not elsewhere classified B35.4 - Tinea corporis Procedures Wound #1 Pre-procedure diagnosis of Wound #1 is a Lymphedema located on the Right,Lateral Malleolus .Severity of Tissue Pre Debridement is: Fat layer exposed. There was a Skin/Subcutaneous Tissue Debridement ([id number]) debridement with total area of 2.34 sq cm performed by STONE III, Yomaira Solar [name]., PA-C. with the following instrument(s): Curette to  remove Viable tissue/material including Exudate, Fibrin/Slough, and Subcutaneous after achieving pain control using Lidocaine 4% Topical Solution. A time out was conducted at 10:09, prior to the start of the procedure. A Minimum amount of bleeding was controlled with Pressure. The procedure was tolerated well with a pain level of 0 throughout and a pain level of 0 following the procedure. Post Debridement Measurements: 1.8cm length x 1.4cm width x 0.3cm depth; 0.594cm^3 volume. Character of Wound/Ulcer Post Debridement requires further debridement. Severity of Tissue Post Debridement is: Fat layer exposed. Post procedure Diagnosis Wound #1: Same as  Pre-Procedure Plan Wound Cleansing: Wound #1 Right,Lateral Malleolus: Clean wound with Normal Saline. Cleanse wound with mild soap and water May Shower, gently pat wound dry prior to applying new dressing. Anesthetic: [name], [name] ([id number]) Wound #1 Right,Lateral Malleolus: Topical Lidocaine 4% cream applied to wound bed prior to debridement - for clinic use Skin Barriers/Peri-Wound Care: Wound #1 Right,Lateral Malleolus: Skin Prep Antifungal cream Primary Wound Dressing: Wound #1 Right,Lateral Malleolus: Santyl Ointment - around wound Secondary Dressing: Wound #1 Right,Lateral Malleolus: Dry Gauze Non-adherent pad - telfa island Dressing Change Frequency: Wound #1 Right,Lateral Malleolus: Change dressing every day. Follow-up Appointments: Wound #1 Right,Lateral Malleolus: Return Appointment in 1 week. Edema Control: Wound #1 Right,Lateral Malleolus: Elevate legs to the level of the heart and pump ankles as often as possible Other: - wear your compression stockings Off-Loading: Wound #1 Right,Lateral Malleolus: Turn and reposition every 2 hours Other: - do not put pressure on your right ankle Additional Orders / Instructions: Wound #1 Right,Lateral Malleolus: Increase protein intake. Other: - Add Vitamin C, Zinc, Vitamin A to your diet The following medication(s) was prescribed: nystatin topical 100,000 unit/gram cream cream topical applied to the skin surrounding the wound of the right ankle applied with dressing changes starting 09/28/2017 I'm going to recommend that we continue with the Santyl per above. We will see patient for reevaluation in one week to see were things stand. If anything worsened significantly in the interim she will contact our office for additional recommendations. I am going to prescribe as above the nystatin cream topically to be applied in the periwound region the Santyl will still be used in the wound bed. Hopefully this will help some  of the redness surrounding which I feel to likely be fungal in nature. Electronic Signature(s) Signed: 09/28/2017 5:05:22 PM By: Worthy Keeler PA-C Entered By: Worthy Keeler on 09/28/2017 10:57:07 [name], [name] ([id number]) -------------------------------------------------------------------------------- ROS/PFSH Details Patient Name: [name] Date of Service: 09/28/2017 9:45 AM Medical Record Number: [id number] Patient Account Number: [id number] Date of Birth/Sex: 1923-10-05 (83 y.o. Female) Treating RN: Ahmed Prima Primary Care Provider: FITZGERALD, DAVID Other Clinician: Referring Provider: FITZGERALD, DAVID Treating Provider/Extender: STONE III, Markeita Alicia Weeks in Treatment: 7 Information Obtained From Patient Wound History Do you currently have one or more open woundso Yes How many open wounds do you currently haveo 1 Approximately how long have you had your woundso 2 yrs How have you been treating your wound(s) until nowo mupirocin, soaking in epsom salt Has your wound(s) ever healed and then re-openedo No Have you had any lab work done in the past montho No Have you tested positive for an antibiotic resistant organism (MRSA, VRE)o No Have you tested positive for osteomyelitis (bone infection)o No Have you had any tests for circulation on your legso Yes Who ordered the testo Dr. Lucky Cowboy [facility] Constitutional Symptoms (General Health) Complaints and Symptoms: Negative for: Fever; Chills  Psychiatric Complaints and Symptoms: Negative for: Anxiety Eyes Medical History: Positive for: Cataracts - surgery Respiratory Complaints and Symptoms: No Complaints or Symptoms Cardiovascular Complaints and Symptoms: No Complaints or Symptoms Medical History: Positive for: Congestive Heart Failure; Hypertension Musculoskeletal Medical History: Positive for: Osteoarthritis Neurologic FLORAINE, BUECHLER ([id number]) Medical History: Positive  for: Neuropathy Oncologic Medical History: Negative for: Received Chemotherapy; Received Radiation HBO Extended History Items Eyes: Cataracts Immunizations Pneumococcal Vaccine: Received Pneumococcal Vaccination: Yes Implantable Devices Family and Social History Cancer: Yes - Father,Siblings; Diabetes: No; Heart Disease: Yes - Siblings; Hereditary Spherocytosis: No; Hypertension: No; Kidney Disease: No; Lung Disease: No; Seizures: No; Stroke: No; Thyroid Problems: No; Tuberculosis: No; Never smoker; Marital Status - Married; Alcohol Use: Never; Drug Use: No History; Caffeine Use: Rarely; Financial Concerns: No; Food, Clothing or Shelter Needs: No; Support System Lacking: No; Transportation Concerns: No; Advanced Directives: No; Patient does not want information on Advanced Directives; Do not resuscitate: No; Living Will: Yes (Not Provided); Medical Power of Attorney: No Physician Affirmation I have reviewed and agree with the above information. Electronic Signature(s) Signed: 09/28/2017 3:57:11 PM By: Alric Quan Signed: 09/28/2017 5:05:22 PM By: Worthy Keeler PA-C Entered By: Worthy Keeler on 09/28/2017 10:51:57 [name], [name] ([id number]) -------------------------------------------------------------------------------- SuperBill Details Patient Name: [name] Date of Service: 09/28/2017 Medical Record Number: [id number] Patient Account Number: [id number] Date of Birth/Sex: [birth year] (83 y.o. 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 Modifier: d Quantity: 1 Physician Procedures CPT4 Code: 7591638 Description: 46659 - WC PHYS LEVEL 3 - EST PT ICD-10 Diagnosis Description D35.701 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

## 2017-10-01 NOTE — Progress Notes (Signed)
SONDA, COPPENS ([id number]) Visit Report for 09/28/2017 Arrival Information Details Patient Name: [name], [name] Date of Service: 09/28/2017 9:45 AM Medical Record Number: [id number] Patient Account Number: [id number] Date of Birth/Sex: [birth year] (83 y.o. Female) Treating RN: Ahmed Prima Primary Care Saide Lanuza: FITZGERALD, DAVID Other Clinician: Referring Welda Azzarello: FITZGERALD, DAVID Treating Analuisa Tudor/Extender: STONE III, HOYT Weeks in Treatment: 7 Visit Information History Since Last Visit All ordered tests and consults were completed: No Patient Arrived: Ambulatory Added or deleted any medications: No Arrival Time: 09:52 Any new allergies or adverse reactions: No Accompanied By: daughter Had a fall or experienced change in No Transfer Assistance: None activities of daily living that may affect Patient Identification Verified: Yes risk of falls: Secondary Verification Process Completed: Yes Signs or symptoms of abuse/neglect since last visito No Patient Requires Transmission-Based No Hospitalized since last visit: No Precautions: Has Dressing in Place as Prescribed: Yes Patient Has Alerts: No Pain Present Now: No Electronic Signature(s) Signed: 09/28/2017 3:57:11 PM By: Alric Quan Entered By: Alric Quan on 09/28/2017 09:53:09 Garrabrant, [name] ([id number]) -------------------------------------------------------------------------------- Encounter Discharge Information Details Patient Name: [name] Date of Service: 09/28/2017 9:45 AM Medical Record Number: [id number] Patient Account Number: [id number] Date of Birth/Sex: Dec 14, 1922 (83 y.o. Female) Treating RN: Ahmed Prima Primary Care Sherrye Puga: FITZGERALD, DAVID Other Clinician: Referring Marshun Duva: FITZGERALD, DAVID Treating Nadirah Socorro/Extender: STONE III, HOYT Weeks in Treatment: 7 Encounter Discharge Information Items Discharge Pain Level: 0 Discharge Condition: Stable Ambulatory  Status: Ambulatory Discharge Destination: Home Transportation: Private Auto Accompanied By: daughter Schedule Follow-up Appointment: Yes Medication Reconciliation completed and No provided to Patient/Care Avalon Coppinger: Provided on Clinical Summary of Care: 09/28/2017 Form Type Recipient Paper Patient WS Electronic Signature(s) Signed: 10/01/2017 11:56:58 AM By: Ruthine Dose Entered By: Ruthine Dose on 09/28/2017 10:19:48 Covell, [name] ([id number]) -------------------------------------------------------------------------------- Lower Extremity Assessment Details Patient Name: [name] Date of Service: 09/28/2017 9:45 AM Medical Record Number: [id number] Patient Account Number: [id number] Date of Birth/Sex: 1923/08/16 (83 y.o. Female) Treating RN: Ahmed Prima Primary Care Brittanee Ghazarian: FITZGERALD, DAVID Other Clinician: Referring Isom Kochan: FITZGERALD, DAVID Treating Belladonna Lubinski/Extender: STONE III, HOYT Weeks in Treatment: 7 Vascular Assessment Pulses: Dorsalis Pedis Palpable: [Right:Yes] Posterior Tibial Extremity colors, hair growth, and conditions: Extremity Color: [Right:Hyperpigmented] Temperature of Extremity: [Right:Warm] Capillary Refill: [Right:< 3 seconds] Toe Nail Assessment Left: Right: Thick: Yes Discolored: Yes Deformed: No Improper Length and Hygiene: No Electronic Signature(s) Signed: 09/28/2017 3:57:11 PM By: Alric Quan Entered By: Alric Quan on 09/28/2017 10:01:04 Sprinkle, [name] ([id number]) -------------------------------------------------------------------------------- Multi Wound Chart Details Patient Name: [name] Date of Service: 09/28/2017 9:45 AM Medical Record Number: [id number] Patient Account Number: [id number] Date of Birth/Sex: 28-Jan-1923 (83 y.o. Female) Treating RN: Ahmed Prima Primary Care Ekam Bonebrake: FITZGERALD, DAVID Other Clinician: Referring Leasia Swann: FITZGERALD, DAVID Treating  Bennetta Rudden/Extender: STONE III, HOYT Weeks in Treatment: 7 Vital Signs Height(in): 65 Pulse(bpm): 82 Weight(lbs): 154.3 Blood Pressure(mmHg): 125/65 Body Mass Index(BMI): 26 Temperature(F): 97.9 Respiratory Rate 18 (breaths/min): Photos: [1:No Photos] [N/A:N/A] Wound Location: [1:Right Malleolus - Lateral] [N/A:N/A] Wounding Event: [1:Gradually Appeared] [N/A:N/A] Primary Etiology: [1:Lymphedema] [N/A:N/A] Secondary Etiology: [1:Arterial Insufficiency Ulcer] [N/A:N/A] Comorbid History: [1:Cataracts, Congestive Heart Failure, Hypertension, Osteoarthritis, Neuropathy] [N/A:N/A] Date Acquired: [1:08/11/2015] [N/A:N/A] Weeks of Treatment: [1:7] [N/A:N/A] Wound Status: [1:Open] [N/A:N/A] Measurements L x W x D [1:1.8x1.3x0.3] [N/A:N/A] (cm) Area (cm) : [1:1.838] [N/A:N/A] Volume (cm) : [1:0.551] [N/A:N/A] % Reduction in Area: [1:-20.00%] [N/A:N/A] % Reduction in Volume: [1:-20.00%] [N/A:N/A] Classification: [1:Partial Thickness] [N/A:N/A] Exudate Amount: [1:Large] [N/A:N/A] Exudate Type: [1:Serous] [N/A:N/A] Exudate Color: [1:amber] [N/A:N/A] Wound Margin: [1:Distinct,  outline attached] [N/A:N/A] Granulation Amount: [1:Medium (34-66%)] [N/A:N/A] Granulation Quality: [1:Red] [N/A:N/A] Necrotic Amount: [1:Medium (34-66%)] [N/A:N/A] Epithelialization: [1:None] [N/A:N/A] Periwound Skin Texture: [1:No Abnormalities Noted] [N/A:N/A] Periwound Skin Moisture: [1:Maceration: Yes] [N/A:N/A] Periwound Skin Color: [1:Erythema: Yes] [N/A:N/A] Erythema Location: [1:Circumferential] [N/A:N/A] Temperature: [1:No Abnormality] [N/A:N/A] Tenderness on Palpation: [1:Yes] [N/A:N/A] Wound Preparation: [1:Ulcer Cleansing: Rinsed/Irrigated with Saline] [N/A:N/A] Topical Anesthetic Applied: Other: lidocaine 4% ANTHEA, UDOVICH ([id number]) Treatment Notes Electronic Signature(s) Signed: 09/28/2017 3:57:11 PM By: Alric Quan Entered By: Alric Quan on 09/28/2017 10:01:38 [name] ([id number]) -------------------------------------------------------------------------------- [place] Details Patient Name: [name] Date of Service: 09/28/2017 9:45 AM Medical Record Number: [id number] Patient Account Number: [id number] Date of Birth/Sex: 10-21-1923 (83 y.o. Female) Treating RN: Ahmed Prima Primary Care Aoi Kouns: FITZGERALD, DAVID Other Clinician: Referring Shannia Jacuinde: FITZGERALD, DAVID Treating Srinika Delone/Extender: STONE III, HOYT Weeks in Treatment: 7 Active Inactive ` Abuse / Safety / Falls / Self Care Management Nursing Diagnoses: Potential for falls Goals: Patient will not experience any injury related to falls Date Initiated: 08/10/2017 Target Resolution Date: 11/10/2017 Goal Status: Active Interventions: Assess Activities of Daily Living upon admission and as needed Assess fall risk on admission and as needed Assess: immobility, friction, shearing, incontinence upon admission and as needed Notes: ` Nutrition Nursing Diagnoses: Imbalanced nutrition Potential for alteratiion in Nutrition/Potential for imbalanced nutrition Goals: Patient/caregiver agrees to and verbalizes understanding of need to use nutritional supplements and/or vitamins as prescribed Date Initiated: 08/10/2017 Target Resolution Date: 12/08/2017 Goal Status: Active Interventions: Assess patient nutrition upon admission and as needed per policy Notes: ` Orientation to the Wound Care Program Nursing Diagnoses: Knowledge deficit related to the wound healing center program Goals: Patient/caregiver will verbalize understanding of the [facility] Date Initiated: 08/10/2017 Target Resolution Date: 09/08/2017 DARCEL, ZICK ([id number]) Goal Status: Active Interventions: Provide education on orientation to the wound center Notes: ` Pain, Acute or Chronic Nursing Diagnoses: Pain, acute or chronic: actual or potential Potential  alteration in comfort, pain Goals: Patient/caregiver will verbalize adequate pain control between visits Date Initiated: 08/10/2017 Target Resolution Date: 12/08/2017 Goal Status: Active Interventions: Complete pain assessment as per visit requirements Notes: ` Wound/Skin Impairment Nursing Diagnoses: Impaired tissue integrity Knowledge deficit related to ulceration/compromised skin integrity Goals: Ulcer/skin breakdown will have a volume reduction of 80% by week 12 Date Initiated: 08/10/2017 Target Resolution Date: 12/01/2017 Goal Status: Active Interventions: Assess patient/caregiver ability to perform ulcer/skin care regimen upon admission and as needed Notes: Electronic Signature(s) Signed: 09/28/2017 3:57:11 PM By: Alric Quan Entered By: Alric Quan on 09/28/2017 10:01:24 [name] ([id number]) -------------------------------------------------------------------------------- Pain Assessment Details Patient Name: [name] Date of Service: 09/28/2017 9:45 AM Medical Record Number: [id number] Patient Account Number: [id number] Date of Birth/Sex: 10-Mar-1923 (83 y.o. Female) Treating RN: Ahmed Prima Primary Care Kadijah Shamoon: FITZGERALD, DAVID Other Clinician: Referring Jasier Calabretta: FITZGERALD, DAVID Treating [name] Muralles/Extender: STONE III, HOYT Weeks in Treatment: 7 Active Problems Location of Pain Severity and Description of Pain Patient Has Paino No Site Locations Pain Management and Medication Current Pain Management: Electronic Signature(s) Signed: 09/28/2017 3:57:11 PM By: Alric Quan Entered By: Alric Quan on 09/28/2017 09:53:18 Devin, [name] ([id number]) -------------------------------------------------------------------------------- Patient/Caregiver Education Details Patient Name: [name] Date of Service: 09/28/2017 9:45 AM Medical Record Number: [id number] Patient Account Number: [id number] Date of  Birth/Gender: 1923-05-21 (83 y.o. Female) Treating RN: Ahmed Prima Primary Care Physician: FITZGERALD, DAVID Other Clinician: Referring Physician: FITZGERALD, DAVID Treating Physician/Extender: Melburn Hake, HOYT Weeks in Treatment: 7 Education Assessment Education Provided To: Patient  Education Topics Provided Wound/Skin Impairment: Handouts: Other: change dressing as ordered Methods: Demonstration, Explain/Verbal Responses: State content correctly Electronic Signature(s) Signed: 09/28/2017 3:57:11 PM By: Alric Quan Entered By: Alric Quan on 09/28/2017 10:07:50 Manternach, [name] ([id number]) -------------------------------------------------------------------------------- Wound Assessment Details Patient Name: [name] Date of Service: 09/28/2017 9:45 AM Medical Record Number: [id number] Patient Account Number: [id number] Date of Birth/Sex: [birth year] (83 y.o. Female) Treating RN: Ahmed Prima Primary Care Valda Christenson: FITZGERALD, DAVID Other Clinician: Referring Clotilde Loth: FITZGERALD, DAVID Treating Adriahna Shearman/Extender: STONE III, HOYT Weeks in Treatment: 7 Wound Status Wound Number: 1 Primary Lymphedema Etiology: Wound Location: Right Malleolus - Lateral Secondary Arterial Insufficiency Ulcer Wounding Event: Gradually Appeared Etiology: Date Acquired: 08/11/2015 Wound Status: Open Weeks Of Treatment: 7 Comorbid Cataracts, Congestive Heart Failure, Clustered Wound: No History: Hypertension, Osteoarthritis, Neuropathy Photos Photo Uploaded By: Alric Quan on 09/28/2017 16:09:03 Wound Measurements Length: (cm) 1.8 Width: (cm) 1.3 Depth: (cm) 0.3 Area: (cm) 1.838 Volume: (cm) 0.551 % Reduction in Area: -20% % Reduction in Volume: -20% Epithelialization: None Tunneling: No Undermining: No Wound Description Classification: Partial Thickness Wound Margin: Distinct, outline attached Exudate Amount: Large Exudate Type: Serous Exudate  Color: amber Foul Odor After Cleansing: No Slough/Fibrino Yes Wound Bed Granulation Amount: Medium (34-66%) Granulation Quality: Red Necrotic Amount: Medium (34-66%) Necrotic Quality: Adherent Slough Periwound Skin Texture Texture Color No Abnormalities Noted: No No Abnormalities Noted: No Erythema: Yes Moisture Cooner, [name] ([id number]) No Abnormalities Noted: No Erythema Location: Circumferential Maceration: Yes Temperature / Pain Temperature: No Abnormality Tenderness on Palpation: Yes Wound Preparation Ulcer Cleansing: Rinsed/Irrigated with Saline Topical Anesthetic Applied: Other: lidocaine 4%, Treatment Notes Wound #1 (Right, Lateral Malleolus) 1. Cleansed with: Clean wound with Normal Saline 2. Anesthetic Topical Lidocaine 4% cream to wound bed prior to debridement 4. Dressing Applied: Santyl Ointment 5. Secondary Dressing Applied Dry [place] Signature(s) Signed: 09/28/2017 3:57:11 PM By: Alric Quan Entered By: Alric Quan on 09/28/2017 10:00:06 [name] ([id number]) -------------------------------------------------------------------------------- [facility] Details Patient Name: [name] Date of Service: 09/28/2017 9:45 AM Medical Record Number: [id number] Patient Account Number: [id number] Date of Birth/Sex: 1923/01/06 (83 y.o. Female) Treating RN: Ahmed Prima Primary Care Jarrick Fjeld: FITZGERALD, DAVID Other Clinician: Referring Genene Kilman: FITZGERALD, DAVID Treating Darlyne Schmiesing/Extender: STONE III, HOYT Weeks in Treatment: 7 Vital Signs Time Taken: 09:53 Temperature (F): 97.9 Height (in): 65 Pulse (bpm): 82 Weight (lbs): 154.3 Respiratory Rate (breaths/min): 18 Body Mass Index (BMI): 25.7 Blood Pressure (mmHg): 125/65 Reference Range: 80 - 120 mg / dl Electronic Signature(s) Signed: 09/28/2017 3:57:11 PM By: Alric Quan Entered By: Alric Quan on 09/28/2017 09:56:31

## 2017-10-05 ENCOUNTER — Encounter: Payer: Medicare Other | Attending: Surgery | Admitting: Surgery

## 2017-10-05 DIAGNOSIS — M1990 Unspecified osteoarthritis, unspecified site: Secondary | ICD-10-CM | POA: Diagnosis not present

## 2017-10-05 DIAGNOSIS — Z85828 Personal history of other malignant neoplasm of skin: Secondary | ICD-10-CM | POA: Diagnosis not present

## 2017-10-05 DIAGNOSIS — I890 Lymphedema, not elsewhere classified: Secondary | ICD-10-CM | POA: Diagnosis not present

## 2017-10-05 DIAGNOSIS — I110 Hypertensive heart disease with heart failure: Secondary | ICD-10-CM | POA: Diagnosis not present

## 2017-10-05 DIAGNOSIS — I509 Heart failure, unspecified: Secondary | ICD-10-CM | POA: Diagnosis not present

## 2017-10-05 DIAGNOSIS — L97312 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d: Secondary | ICD-10-CM | POA: Diagnosis not present

## 2017-10-05 DIAGNOSIS — B354 Tinea corporis: Secondary | ICD-10-CM | POA: Insufficient documentation

## 2017-10-05 DIAGNOSIS — Z950 Presence of cardiac pacemaker: Secondary | ICD-10-CM | POA: Insufficient documentation

## 2017-10-05 DIAGNOSIS — Z7982 Long term (current) use of aspirin: Secondary | ICD-10-CM | POA: Diagnosis not present

## 2017-10-05 DIAGNOSIS — Z79899 Other long term (current) drug therapy: Secondary | ICD-10-CM | POA: Insufficient documentation

## 2017-10-05 DIAGNOSIS — I70233 Atherosclerosis of native arteries of right leg with ulceration of ankle: Secondary | ICD-10-CM | POA: Insufficient documentation

## 2017-10-05 DIAGNOSIS — G629 Polyneuropathy, unspecified: Secondary | ICD-10-CM | POA: Diagnosis not present

## 2017-10-07 NOTE — Progress Notes (Signed)
[name], [name] ([id number]) Visit Report for 10/05/2017 Chief Complaint Document Details Patient Name: [name], [name]. Date of Service: 10/05/2017 9:45 AM Medical Record Number: [id number] Patient Account Number: [id number] Date of Birth/Sex: 1923/04/09 (83 y.o. Female) Treating RN: Ahmed Prima Primary Care Provider: FITZGERALD, DAVID Other Clinician: Referring Provider: FITZGERALD, DAVID Treating Provider/Extender: Frann Rider in Treatment: 8 Information Obtained from: Patient Chief Complaint Patient presents to the wound care center for a consult due non healing wound to the right lateral malleolus Electronic Signature(s) Signed: 10/05/2017 10:00:51 AM By: Christin Fudge MD, FACS Entered By: Christin Fudge on 10/05/2017 10:00:50 [name] ([id number]) -------------------------------------------------------------------------------- Debridement Details Patient Name: [name] Date of Service: 10/05/2017 9:45 AM Medical Record Number: [id number] Patient Account Number: [id number] Date of Birth/Sex: June 04, 1923 (83 y.o. Female) Treating RN: Ahmed Prima Primary Care Provider: FITZGERALD, DAVID Other Clinician: Referring Provider: FITZGERALD, DAVID Treating Provider/Extender: Frann Rider in Treatment: 8 Debridement Performed for Wound #1 Right,Lateral Malleolus Assessment: Performed By: Physician Christin Fudge, MD Debridement: Debridement Severity of Tissue Pre Fat layer exposed Debridement: Pre-procedure Verification/Time Yes - 09:54 Out Taken: Start Time: 09:55 Pain Control: Lidocaine 4% Topical Solution Level: Skin/Subcutaneous Tissue Total Area Debrided (L x W): 1.5 (cm) x 1.5 (cm) = 2.25 (cm) Tissue and other material Viable, Non-Viable, Exudate, Fibrin/Slough, Subcutaneous debrided: Instrument: Curette Bleeding: Minimum Hemostasis Achieved: Pressure End Time: 09:57 Procedural Pain: 0 Post Procedural Pain: 0 Response to  Treatment: Procedure was tolerated well Post Debridement Measurements of Total Wound Length: (cm) 1.5 Width: (cm) 1.5 Depth: (cm) 0.4 Volume: (cm) 0.707 Character of Wound/Ulcer Post Debridement: Requires Further Debridement Severity of Tissue Post Debridement: Fat layer exposed Post Procedure Diagnosis Same as Pre-procedure Electronic Signature(s) Signed: 10/05/2017 10:00:42 AM By: Christin Fudge MD, FACS Signed: 10/05/2017 4:07:41 PM By: Alric Quan Previous Signature: 10/05/2017 10:00:29 AM Version By: Christin Fudge MD, FACS Entered By: Christin Fudge on 10/05/2017 10:00:42 [name] ([id number]) -------------------------------------------------------------------------------- HPI Details Patient Name: [name] Date of Service: 10/05/2017 9:45 AM Medical Record Number: [id number] Patient Account Number: [id number] Date of Birth/Sex: 08/20/1923 (83 y.o. Female) Treating RN: Ahmed Prima Primary Care Provider: FITZGERALD, DAVID Other Clinician: Referring Provider: FITZGERALD, DAVID Treating Provider/Extender: Frann Rider in Treatment: 8 History of Present Illness Location: Patient presents with an ulcer on the right lateral malleolus Quality: Patient reports experiencing a dull pain to affected area(s). Severity: Patient states wound are getting worse. Duration: Patient has had the wound for >2 years prior to seeking treatment at the wound center Timing: Pain in wound is constant (hurts all the time) Context: The wound would happen gradually Modifying Factors: Other treatment(s) tried include:seen a podiatrist on a vascular surgeons and under treatment with them Associated Signs and Symptoms: Patient reports having increase swelling. 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 Dr.  Amalia Hailey the podiatrist saw her on 07/20/2017 and sent her to the wound center for possible hyperbaric oxygen therapy. past medical history of peripheral vascular disease, varicose veins, status post appendectomy, basal cell carcinoma excision from the left leg, cholecystectomy, pacemaker placement, right lower extremity angiography done by Dr. dew in March 2017 with placement of a stent. there is also note of a successful ablation of the right small saphenous vein done which was reviewed by ultrasound on 10/24/2016. the patient had a right small saphenous vein ablation done on  10/20/2016. The patient has never been a smoker. She has been seen by Dr. Corene Cornea dew the vascular surgeon who most recently saw her on 06/15/2017 for evaluation of ongoing problems with right leg swelling. She had a lower extremity arterial duplex examination done(02/13/17) which showed patent distal right superficial femoral artery stent and above-the-knee popliteal stent without evidence of restenosis. The ABI was more than 1.3 on the right and more than 1.3 on the left. This was consistent with noncompressible arteries due to medial calcification. The right great toe pressure and PPG waveforms are within normal limits and the left great toe pressure and PPG waveforms are decreased. he recommended she continue to wear her compression stockings and continue with elevation. She is scheduled to have a noninvasive arterial study in the near future 08/16/2017 -- had a lower extremity arterial duplex examination done which showed patent distal right superficial femoral artery stent and above-the-knee popliteal stent without evidence of restenosis. The ABI was more than 1.3 on the right and more than 1.3 on the left. This was consistent with noncompressible arteries due to medial calcification. The right great toe pressure and PPG waveforms are within normal limits and the left great toe pressure and PPG waveforms are decreased. the  x-ray of the right ankle has not yet been done 08/24/2017 -- had a right ankle x-ray -- IMPRESSION:1. No fracture, bone lesion or evidence of osteomyelitis. 2. Lateral soft tissue swelling with a soft tissue ulcer. she has not yet seen the vascular surgeon for review 08/31/17 on evaluation today patient's wound appears to be showing signs of improvement. She still with her appointment with vascular in order to review her results of her vascular study and then determine if any intervention would be recommended at that time. No fevers, chills, nausea, or vomiting noted at this time. She has been tolerating the dressing changes without complication. 09/28/17 on evaluation today patient's wound appears to show signs of good improvement in regard to the granulation tissue which is surfacing. There is still a layer of slough covering the wound and the posterior portion is still significantly deeper than the anterior nonetheless there has been some good sign of things moving towards the better. She is going to go back to Dr. dew for reevaluation to ensure her blood flow is still appropriate. That will be before her next evaluation with [place] next week. No fevers, chills, nausea, or vomiting noted at this time. Patient does have some discomfort rated to be a 3-4/10 depending on activity specifically cleansing the wound makes it worse. [name], [name] ([id number]) 10/05/2017 -- the patient was seen by Dr. dew last week and noninvasive studies showed a normal right ABI with brisk triphasic waveforms consistent with no arterial insufficiency including normal digital pressures. The duplex showed a patent distal right SFA stent and the proximal SFA was also normal. He was pleased with her test and thought she should have enough of perfusion for normal wound healing. He would see her back in 6 months time. Electronic Signature(s) Signed: 10/05/2017 10:00:56 AM By: Christin Fudge MD, FACS Previous Signature:  10/05/2017 10:00:03 AM Version By: Christin Fudge MD, FACS Previous Signature: 10/05/2017 9:59:45 AM Version By: Christin Fudge MD, FACS Entered By: Christin Fudge on 10/05/2017 10:00:56 [name] ([id number]) -------------------------------------------------------------------------------- Physical Exam Details Patient Name: [name] Date of Service: 10/05/2017 9:45 AM Medical Record Number: [id number] Patient Account Number: [id number] Date of Birth/Sex: [birth year] (83 y.o. Female) Treating RN: Ahmed Prima Primary Care Provider: FITZGERALD,  DAVID Other Clinician: Referring Provider: FITZGERALD, DAVID Treating Provider/Extender: Christin Fudge Weeks in Treatment: 8 Constitutional . Pulse regular. Respirations normal and unlabored. Afebrile. . Eyes Nonicteric. Reactive to light. Ears, Nose, Mouth, and Throat Lips, teeth, and gums WNL.Marland Kitchen Moist mucosa without lesions. Neck supple and nontender. No palpable supraclavicular or cervical adenopathy. Normal sized without goiter. Respiratory WNL. No retractions.. Cardiovascular Pedal Pulses WNL. No clubbing, cyanosis or edema. Lymphatic No adneopathy. No adenopathy. No adenopathy. Musculoskeletal Adexa without tenderness or enlargement.. Digits and nails w/o clubbing, cyanosis, infection, petechiae, ischemia, or inflammatory conditions.. Integumentary (Hair, Skin) No suspicious lesions. No crepitus or fluctuance. No peri-wound warmth or erythema. No masses.Marland Kitchen Psychiatric Judgement and insight Intact.. No evidence of depression, anxiety, or agitation.. Notes the wound was debrided off all the subcutaneous debris and there is some healthy granulation tissue. I believe she will benefit from using Santyl for another week. Surrounding the wound there is some redness which may be just the Santyl causing a reaction to the normal skin or a fungal infection and hence it is prudent to continue using the antifungal on the  skin surrounding Electronic Signature(s) Signed: 10/05/2017 10:02:11 AM By: Christin Fudge MD, FACS Entered By: Christin Fudge on 10/05/2017 10:02:10 [name] ([id number]) -------------------------------------------------------------------------------- Physician Orders Details Patient Name: [name] Date of Service: 10/05/2017 9:45 AM Medical Record Number: [id number] Patient Account Number: [id number] Date of Birth/Sex: Apr 25, 1923 (83 y.o. Female) Treating RN: Ahmed Prima Primary Care Provider: FITZGERALD, DAVID Other Clinician: Referring Provider: FITZGERALD, DAVID Treating Provider/Extender: Frann Rider in Treatment: 8 Verbal / Phone Orders: Yes Clinician: Pinkerton, Debi Read Back and Verified: Yes Diagnosis Coding Wound Cleansing Wound #1 Right,Lateral Malleolus o Clean wound with Normal Saline. o Cleanse wound with mild soap and water o May Shower, gently pat wound dry prior to applying new dressing. Anesthetic Wound #1 Right,Lateral Malleolus o Topical Lidocaine 4% cream applied to wound bed prior to debridement - for clinic use Skin Barriers/Peri-Wound Care Wound #1 Right,Lateral Malleolus o Skin Prep o Antifungal cream Primary Wound Dressing Wound #1 Right,Lateral Malleolus o Santyl Ointment - around wound Secondary Dressing Wound #1 Right,Lateral Malleolus o Dry Gauze o Non-adherent pad - telfa island Dressing Change Frequency Wound #1 Right,Lateral Malleolus o Change dressing every day. Follow-up Appointments Wound #1 Right,Lateral Malleolus o Return Appointment in 1 week. Edema Control Wound #1 Right,Lateral Malleolus o Elevate legs to the level of the heart and pump ankles as often as possible o Other: - wear your compression stockings Off-Loading Wound #1 Right,Lateral Malleolus o Turn and reposition every 2 hours o Other: - do not put pressure on your right ankle Pines, [name]  ([id number]) Additional Orders / Instructions Wound #1 Right,Lateral Malleolus o Increase protein intake. o Other: - Add Vitamin C, Zinc, Vitamin A to your diet Electronic Signature(s) Signed: 10/05/2017 4:07:41 PM By: Alric Quan Signed: 10/05/2017 4:21:18 PM By: Christin Fudge MD, FACS Entered By: Alric Quan on 10/05/2017 09:57:41 Wyer, [name] ([id number]) -------------------------------------------------------------------------------- Problem List Details Patient Name: [name] Date of Service: 10/05/2017 9:45 AM Medical Record Number: [id number] Patient Account Number: [id number] Date of Birth/Sex: [birth year] (83 y.o. Female) Treating RN: Ahmed Prima Primary Care Provider: FITZGERALD, DAVID Other Clinician: Referring Provider: FITZGERALD, DAVID Treating Provider/Extender: Frann Rider in Treatment: 8 Active Problems ICD-10 Encounter Code Description Active Date Diagnosis L97.312 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d 08/10/2017 Yes I70.233 Atherosclerosis of native arteries of right leg with ulceration of ankle 08/10/2017 Yes I89.0  Lymphedema, not elsewhere classified 08/10/2017 Yes B35.4 Tinea corporis 09/28/2017 Yes Inactive Problems Resolved Problems Electronic Signature(s) Signed: 10/05/2017 10:00:14 AM By: Christin Fudge MD, FACS Entered By: Christin Fudge on 10/05/2017 10:00:14 [name] ([id number]) -------------------------------------------------------------------------------- Progress Note Details Patient Name: [name] Date of Service: 10/05/2017 9:45 AM Medical Record Number: [id number] Patient Account Number: [id number] Date of Birth/Sex: March 25, 1923 (83 y.o. Female) Treating RN: Ahmed Prima Primary Care Provider: FITZGERALD, DAVID Other Clinician: Referring Provider: FITZGERALD, DAVID Treating Provider/Extender: Frann Rider in Treatment: 8 Subjective Chief  Complaint Information obtained from Patient Patient presents to the wound care center for a consult due non healing wound to the right lateral malleolus History of Present Illness (HPI) The following HPI elements were documented for the patient's wound: Location: Patient presents with an ulcer on the right lateral malleolus Quality: Patient reports experiencing a dull pain to affected area(s). Severity: Patient states wound are getting worse. Duration: Patient has had the wound for >2 years prior to seeking treatment at the wound center Timing: Pain in wound is constant (hurts all the time) Context: The wound would happen gradually Modifying Factors: Other treatment(s) tried include:seen a podiatrist on a vascular surgeons and under treatment with them Associated Signs and Symptoms: Patient reports having increase swelling. 83 year old patient who most recently has been seeing both podiatry and vascular surgery for a long-standing ulcer of her right lateral malleolus which has been treated with various methodologies. Dr. Amalia Hailey the podiatrist saw her on 07/20/2017 and sent her to the wound center for possible hyperbaric oxygen therapy. past medical history of peripheral vascular disease, varicose veins, status post appendectomy, basal cell carcinoma excision from the left leg, cholecystectomy, pacemaker placement, right lower extremity angiography done by Dr. dew in March 2017 with placement of a stent. there is also note of a successful ablation of the right small saphenous vein done which was reviewed by ultrasound on 10/24/2016. the patient had a right small saphenous vein ablation done on 10/20/2016. The patient has never been a smoker. She has been seen by Dr. Corene Cornea dew the vascular surgeon who most recently saw her on 06/15/2017 for evaluation of ongoing problems with right leg swelling. She had a lower extremity arterial duplex examination done(02/13/17) which showed patent distal right  superficial femoral artery stent and above-the-knee popliteal stent without evidence of restenosis. The ABI was more than 1.3 on the right and more than 1.3 on the left. This was consistent with noncompressible arteries due to medial calcification. The right great toe pressure and PPG waveforms are within normal limits and the left great toe pressure and PPG waveforms are decreased. he recommended she continue to wear her compression stockings and continue with elevation. She is scheduled to have a noninvasive arterial study in the near future 08/16/2017 -- had a lower extremity arterial duplex examination done which showed patent distal right superficial femoral artery stent and above-the-knee popliteal stent without evidence of restenosis. The ABI was more than 1.3 on the right and more than 1.3 on the left. This was consistent with noncompressible arteries due to medial calcification. The right great toe pressure and PPG waveforms are within normal limits and the left great toe pressure and PPG waveforms are decreased. the x-ray of the right ankle has not yet been done 08/24/2017 -- had a right ankle x-ray -- IMPRESSION:1. No fracture, bone lesion or evidence of osteomyelitis. 2. Lateral soft tissue swelling with a soft tissue ulcer. she has not yet seen the vascular surgeon for  review 08/31/17 on evaluation today patient's wound appears to be showing signs of improvement. She still with her appointment with vascular in order to review her results of her vascular study and then determine if any intervention would be recommended at that time. No fevers, chills, nausea, or vomiting noted at this time. She has been tolerating the dressing changes without [name], [name] ([id number]) complication. 09/28/17 on evaluation today patient's wound appears to show signs of good improvement in regard to the granulation tissue which is surfacing. There is still a layer of slough covering the wound and  the posterior portion is still significantly deeper than the anterior nonetheless there has been some good sign of things moving towards the better. She is going to go back to Dr. dew for reevaluation to ensure her blood flow is still appropriate. That will be before her next evaluation with [place] next week. No fevers, chills, nausea, or vomiting noted at this time. Patient does have some discomfort rated to be a 3-4/10 depending on activity specifically cleansing the wound makes it worse. 10/05/2017 -- the patient was seen by Dr. dew last week and noninvasive studies showed a normal right ABI with brisk triphasic waveforms consistent with no arterial insufficiency including normal digital pressures. The duplex showed a patent distal right SFA stent and the proximal SFA was also normal. He was pleased with her test and thought she should have enough of perfusion for normal wound healing. He would see her back in 6 months time. Patient History Information obtained from Patient. Family History Cancer - Father,Siblings, Heart Disease - Siblings, No family history of Diabetes, Hereditary Spherocytosis, Hypertension, Kidney Disease, Lung Disease, Seizures, Stroke, Thyroid Problems, Tuberculosis. Social History Never smoker, Marital Status - Married, Alcohol Use - Never, Drug Use - No History, Caffeine Use - Rarely. Objective Constitutional Pulse regular. Respirations normal and unlabored. Afebrile. Vitals Time Taken: 9:39 AM, Height: 65 in, Weight: 154.3 lbs, BMI: 25.7, Temperature: 98.0 F, Pulse: 89 bpm, Respiratory Rate: 18 breaths/min, Blood Pressure: 128/59 mmHg. Eyes Nonicteric. Reactive to light. Ears, Nose, Mouth, and Throat Lips, teeth, and gums WNL.Marland Kitchen Moist mucosa without lesions. Neck supple and nontender. No palpable supraclavicular or cervical adenopathy. Normal sized without goiter. Respiratory WNL. No retractions.. Cardiovascular Pedal Pulses WNL. No clubbing, cyanosis or  edema. [name], [name] ([id number]) Lymphatic No adneopathy. No adenopathy. No adenopathy. Musculoskeletal Adexa without tenderness or enlargement.. Digits and nails w/o clubbing, cyanosis, infection, petechiae, ischemia, or inflammatory conditions.Marland Kitchen Psychiatric Judgement and insight Intact.. No evidence of depression, anxiety, or agitation.. General Notes: the wound was debrided off all the subcutaneous debris and there is some healthy granulation tissue. I believe she will benefit from using Santyl for another week. Surrounding the wound there is some redness which may be just the Santyl causing a reaction to the normal skin or a fungal infection and hence it is prudent to continue using the antifungal on the skin surrounding Integumentary (Hair, Skin) No suspicious lesions. No crepitus or fluctuance. No peri-wound warmth or erythema. No masses.. Wound #1 status is Open. Original cause of wound was Gradually Appeared. The wound is located on the Right,Lateral Malleolus. The wound measures 1.5cm length x 1.5cm width x 0.3cm depth; 1.767cm^2 area and 0.53cm^3 volume. There is no tunneling noted. There is a large amount of serous drainage noted. The wound margin is distinct with the outline attached to the wound base. There is medium (34-66%) red granulation within the wound bed. There is a medium (34-66%) amount of necrotic  tissue within the wound bed including Adherent Slough. The periwound skin appearance exhibited: Maceration, Erythema. The surrounding wound skin color is noted with erythema which is circumferential. Periwound temperature was noted as No Abnormality. The periwound has tenderness on palpation. Assessment Active Problems ICD-10 L97.312 - 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d I70.233 - Atherosclerosis of native arteries of right leg with ulceration of ankle I89.0 - Lymphedema, not elsewhere classified B35.4 - Tinea corporis Procedures Wound  #1 Pre-procedure diagnosis of Wound #1 is a Lymphedema located on the Right,Lateral Malleolus .Severity of Tissue Pre Debridement is: Fat layer exposed. There was a Skin/Subcutaneous Tissue Debridement ([id number]) debridement with total area of 2.25 sq cm performed by Christin Fudge, MD. with the following instrument(s): Curette to remove Viable and Non- Viable tissue/material including Exudate, Fibrin/Slough, and Subcutaneous after achieving pain control using Lidocaine 4% Topical Solution. A time out was conducted at 09:54, prior to the start of the procedure. A Minimum amount of bleeding was controlled with Pressure. The procedure was tolerated well with a pain level of 0 throughout and a pain level of 0 following the procedure. Post Debridement Measurements: 1.5cm length x 1.5cm width x 0.4cm depth; 0.707cm^3 volume. Character of Wound/Ulcer Post Debridement requires further debridement. Severity of Tissue Post Debridement is: Fat layer exposed. [name], [name] ([id number]) Post procedure Diagnosis Wound #1: Same as Pre-Procedure Plan Wound Cleansing: Wound #1 Right,Lateral Malleolus: Clean wound with Normal Saline. Cleanse wound with mild soap and water May Shower, gently pat wound dry prior to applying new dressing. Anesthetic: Wound #1 Right,Lateral Malleolus: Topical Lidocaine 4% cream applied to wound bed prior to debridement - for clinic use Skin Barriers/Peri-Wound Care: Wound #1 Right,Lateral Malleolus: Skin Prep Antifungal cream Primary Wound Dressing: Wound #1 Right,Lateral Malleolus: Santyl Ointment - around wound Secondary Dressing: Wound #1 Right,Lateral Malleolus: Dry Gauze Non-adherent pad - telfa island Dressing Change Frequency: Wound #1 Right,Lateral Malleolus: Change dressing every day. Follow-up Appointments: Wound #1 Right,Lateral Malleolus: Return Appointment in 1 week. Edema Control: Wound #1 Right,Lateral Malleolus: Elevate legs to the level of  the heart and pump ankles as often as possible Other: - wear your compression stockings Off-Loading: Wound #1 Right,Lateral Malleolus: Turn and reposition every 2 hours Other: - do not put pressure on your right ankle Additional Orders / Instructions: Wound #1 Right,Lateral Malleolus: Increase protein intake. Other: - Add Vitamin C, Zinc, Vitamin A to your diet After sharp debridement and a review, I have recommended: 1. Santyl ointment locally to be applied with a bordered foam after washing with soap and water 2. liberal use of the antifungal to the surrounding skin 3. Continue to wear her compression stockings as before 4. elevation and exercise discussed with her in great detail [name], [name] ([id number]) 5. appointment to see the vascular surgeons note reviewed 6. Regular visits to the wound center Electronic Signature(s) Signed: 10/05/2017 10:03:00 AM By: Christin Fudge MD, FACS Entered By: Christin Fudge on 10/05/2017 10:02:59 [name] ([id number]) -------------------------------------------------------------------------------- ROS/PFSH Details Patient Name: [name] Date of Service: 10/05/2017 9:45 AM Medical Record Number: [id number] Patient Account Number: [id number] Date of Birth/Sex: 1923/04/10 (83 y.o. Female) Treating RN: Ahmed Prima Primary Care Provider: FITZGERALD, DAVID Other Clinician: Referring Provider: FITZGERALD, DAVID Treating Provider/Extender: Frann Rider in Treatment: 8 Information Obtained From Patient Wound History Do you currently have one or more open woundso Yes How many open wounds do you currently haveo 1 Approximately how long have you had your woundso 2 yrs  How have you been treating your wound(s) until nowo mupirocin, soaking in epsom salt Has your wound(s) ever healed and then re-openedo No Have you had any lab work done in the past montho No Have you tested positive for an antibiotic resistant organism  (MRSA, VRE)o No Have you tested positive for osteomyelitis (bone infection)o No Have you had any tests for circulation on your legso Yes Who ordered the testo Dr. Lucky Cowboy [facility] test [facility] Eyes Medical History: Positive for: Cataracts - surgery Cardiovascular Medical History: Positive for: Congestive Heart Failure; Hypertension Musculoskeletal Medical History: Positive for: Osteoarthritis Neurologic Medical History: Positive for: Neuropathy Oncologic Medical History: Negative for: Received Chemotherapy; Received Radiation HBO Extended History Items Eyes: Cataracts Immunizations Pneumococcal Vaccine: Received Pneumococcal Vaccination: Yes [name], [name] ([id number]) Implantable Devices Family and Social History Cancer: Yes - Father,Siblings; Diabetes: No; Heart Disease: Yes - Siblings; Hereditary Spherocytosis: No; Hypertension: No; Kidney Disease: No; Lung Disease: No; Seizures: No; Stroke: No; Thyroid Problems: No; Tuberculosis: No; Never smoker; Marital Status - Married; Alcohol Use: Never; Drug Use: No History; Caffeine Use: Rarely; Financial Concerns: No; Food, Clothing or Shelter Needs: No; Support System Lacking: No; Transportation Concerns: No; Advanced Directives: No; Patient does not want information on Advanced Directives; Do not resuscitate: No; Living Will: Yes (Not Provided); Medical Power of Attorney: No Physician Affirmation I have reviewed and agree with the above information. Electronic Signature(s) Signed: 10/05/2017 4:07:41 PM By: Alric Quan Signed: 10/05/2017 4:21:18 PM By: Christin Fudge MD, FACS Entered By: Christin Fudge on 10/05/2017 10:01:03 [name] ([id number]) -------------------------------------------------------------------------------- SuperBill Details Patient Name: [name] Date of Service: 10/05/2017 Medical Record Number: [id number] Patient Account Number: [id number] Date of Birth/Sex: 01/18/1923 (83  y.o. 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 I70.233 Atherosclerosis of native arteries of right leg with ulceration I89.0 Lymphedema, not elsewhere  classified B35.4 Tinea corporis Modifier: d of ankle Quantity: 1 Physician Procedures CPT4 Code: 3716967 Description: 89381 - WC PHYS LEVEL 3 - EST PT ICD-10 Diagnosis Description O17.510 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 I70.233 Atherosclerosis of native arteries of right leg with ulceration I89.0 Lymphedema, not elsewhere  classified B35.4 Tinea corporis Modifier: 25 d of ankle Quantity: 1 CPT4 Code: 2585277 Description: 11042 - WC PHYS SUBQ TISS 20 SQ CM ICD-10 Diagnosis Description O24.235 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

## 2017-10-08 NOTE — Progress Notes (Signed)
[name], [name] ([id number]) Visit Report for 10/05/2017 Arrival Information Details Patient Name: [name], [name] Date of Service: 10/05/2017 9:45 AM Medical Record Number: [id number] Patient Account Number: [id number] Date of Birth/Sex: 27-May-1923 (83 y.o. Female) Treating RN: Ahmed Prima Primary Care [name]: FITZGERALD, DAVID Other Clinician: Referring [name]: FITZGERALD, DAVID Treating [name]/Extender: Frann Rider in Treatment: 8 Visit Information History Since Last Visit All ordered tests and consults were completed: No Patient Arrived: Ambulatory Added or deleted any medications: No Arrival Time: 09:38 Any new allergies or adverse reactions: No Accompanied By: daughter Had a fall or experienced change in No Transfer Assistance: None activities of daily living that may affect Patient Identification Verified: Yes risk of falls: Secondary Verification Process Completed: Yes Signs or symptoms of abuse/neglect since last visito No Patient Requires Transmission-Based No Hospitalized since last visit: No Precautions: Has Dressing in Place as Prescribed: Yes Patient Has Alerts: No Pain Present Now: No Electronic Signature(s) Signed: 10/05/2017 4:07:41 PM By: Alric Quan Entered By: Alric Quan on 10/05/2017 09:39:16 [name], [name] ([id number]) -------------------------------------------------------------------------------- Encounter Discharge Information Details Patient Name: [name] Date of Service: 10/05/2017 9:45 AM Medical Record Number: [id number] Patient Account Number: [id number] Date of Birth/Sex: 12/19/1922 (83 y.o. Female) Treating RN: Ahmed Prima Primary Care [name]: FITZGERALD, DAVID Other Clinician: Referring [name]: FITZGERALD, DAVID Treating Nikie Cid/Extender: Frann Rider in Treatment: 8 Encounter Discharge Information Items Discharge Pain Level: 0 Discharge Condition: Stable Ambulatory Status:  Ambulatory Discharge Destination: Home Transportation: Private Auto Accompanied By: daughter Schedule Follow-up Appointment: Yes Medication Reconciliation completed and No provided to Patient/Care Burdell Peed: Provided on Clinical Summary of Care: 10/05/2017 Form Type Recipient Paper Patient [name] Electronic Signature(s) Signed: 10/08/2017 10:06:49 AM By: Ruthine Dose Previous Signature: 10/05/2017 9:48:56 AM Version By: Alric Quan Entered By: Ruthine Dose on 10/05/2017 10:06:57 [name], [name] ([id number]) -------------------------------------------------------------------------------- Lower Extremity Assessment Details Patient Name: [name] Date of Service: 10/05/2017 9:45 AM Medical Record Number: [id number] Patient Account Number: [id number] Date of Birth/Sex: 20-Jan-1923 (83 y.o. Female) Treating RN: Ahmed Prima Primary Care Rayane Gallardo: FITZGERALD, DAVID Other Clinician: Referring Lacrisha Bielicki: FITZGERALD, DAVID Treating Alper Guilmette/Extender: Frann Rider in Treatment: 8 Vascular Assessment Pulses: Dorsalis Pedis Palpable: [Right:Yes] Posterior Tibial Extremity colors, hair growth, and conditions: Extremity Color: [Right:Hyperpigmented] Temperature of Extremity: [Right:Warm] Capillary Refill: [Right:< 3 seconds] Toe Nail Assessment Left: Right: Thick: Yes Discolored: Yes Deformed: No Improper Length and Hygiene: No Electronic Signature(s) Signed: 10/05/2017 4:07:41 PM By: Alric Quan Entered By: Alric Quan on 10/05/2017 09:46:50 [name], [name] ([id number]) -------------------------------------------------------------------------------- Multi Wound Chart Details Patient Name: [name] Date of Service: 10/05/2017 9:45 AM Medical Record Number: [id number] Patient Account Number: [id number] Date of Birth/Sex: September 30, 1923 (83 y.o. Female) Treating RN: Ahmed Prima Primary Care Beatriz Settles: FITZGERALD, DAVID Other  Clinician: Referring Kelaiah Escalona: FITZGERALD, DAVID Treating Mariluz Crespo/Extender: Frann Rider in Treatment: 8 Vital Signs Height(in): 65 Pulse(bpm): 89 Weight(lbs): 154.3 Blood Pressure(mmHg): 128/59 Body Mass Index(BMI): 26 Temperature(F): 98.0 Respiratory Rate 18 (breaths/min): Photos: [1:No Photos] [N/A:N/A] Wound Location: [1:Right Malleolus - Lateral] [N/A:N/A] Wounding Event: [1:Gradually Appeared] [N/A:N/A] Primary Etiology: [1:Lymphedema] [N/A:N/A] Secondary Etiology: [1:Arterial Insufficiency Ulcer] [N/A:N/A] Comorbid History: [1:Cataracts, Congestive Heart Failure, Hypertension, Osteoarthritis, Neuropathy] [N/A:N/A] Date Acquired: [1:08/11/2015] [N/A:N/A] Weeks of Treatment: [1:8] [N/A:N/A] Wound Status: [1:Open] [N/A:N/A] Measurements L x W x D [1:1.5x1.5x0.3] [N/A:N/A] (cm) Area (cm) : [1:1.767] [N/A:N/A] Volume (cm) : [1:0.53] [N/A:N/A] % Reduction in Area: [1:-15.30%] [N/A:N/A] % Reduction in Volume: [1:-15.50%] [N/A:N/A] Classification: [1:Partial Thickness] [N/A:N/A] Exudate Amount: [1:Large] [N/A:N/A] Exudate Type: [1:Serous] [N/A:N/A] Exudate Color: [  1:amber] [N/A:N/A] Wound Margin: [1:Distinct, outline attached] [N/A:N/A] Granulation Amount: [1:Medium (34-66%)] [N/A:N/A] Granulation Quality: [1:Red] [N/A:N/A] Necrotic Amount: [1:Medium (34-66%)] [N/A:N/A] Epithelialization: [1:Small (1-33%)] [N/A:N/A] Debridement: [1:Debridement ([id number])] [N/A:N/A] Pre-procedure [1:09:54] [N/A:N/A] Verification/Time Out Taken: Pain Control: [1:Lidocaine 4% Topical Solution] [N/A:N/A] Tissue Debrided: [1:Fibrin/Slough, Exudates, Subcutaneous] [N/A:N/A] Level: [1:Skin/Subcutaneous Tissue] [N/A:N/A] Debridement Area (sq cm): [1:2.25] [N/A:N/A] Instrument: [1:Curette] [N/A:N/A] Bleeding: [1:Minimum] [N/A:N/A] Hemostasis Achieved: [1:Pressure] [N/A:N/A] Procedural Pain: 0 N/A N/A Post Procedural Pain: 0 N/A N/A Debridement Treatment Procedure was tolerated  well N/A N/A Response: Post Debridement 1.5x1.5x0.4 N/A N/A Measurements L x W x D (cm) Post Debridement Volume: 0.707 N/A N/A (cm) Periwound Skin Texture: No Abnormalities Noted N/A N/A Periwound Skin Moisture: Maceration: Yes N/A N/A Periwound Skin Color: Erythema: Yes N/A N/A Erythema Location: Circumferential N/A N/A Temperature: No Abnormality N/A N/A Tenderness on Palpation: Yes N/A N/A Wound Preparation: Ulcer Cleansing: N/A N/A Rinsed/Irrigated with Saline Topical Anesthetic Applied: Other: lidocaine 4% Procedures Performed: Debridement N/A N/A Treatment Notes Electronic Signature(s) Signed: 10/05/2017 10:00:20 AM By: Christin Fudge MD, FACS Entered By: Christin Fudge on 10/05/2017 10:00:19 [name] ([id number]) -------------------------------------------------------------------------------- Multi-Disciplinary Care Plan Details Patient Name: [name] Date of Service: 10/05/2017 9:45 AM Medical Record Number: [id number] Patient Account Number: [id number] Date of Birth/Sex: 11-May-1923 (83 y.o. Female) Treating RN: Ahmed Prima Primary Care Fleet Higham: FITZGERALD, DAVID Other Clinician: Referring Kimi Bordeau: FITZGERALD, DAVID Treating Yocheved Depner/Extender: Frann Rider in Treatment: 8 Active Inactive ` Abuse / Safety / Falls / Self Care Management Nursing Diagnoses: Potential for falls Goals: Patient will not experience any injury related to falls Date Initiated: 08/10/2017 Target Resolution Date: 11/10/2017 Goal Status: Active Interventions: Assess Activities of Daily Living upon admission and as needed Assess fall risk on admission and as needed Assess: immobility, friction, shearing, incontinence upon admission and as needed Notes: ` Nutrition Nursing Diagnoses: Imbalanced nutrition Potential for alteratiion in Nutrition/Potential for imbalanced nutrition Goals: Patient/caregiver agrees to and verbalizes understanding of need to use  nutritional supplements and/or vitamins as prescribed Date Initiated: 08/10/2017 Target Resolution Date: 12/08/2017 Goal Status: Active Interventions: Assess patient nutrition upon admission and as needed per policy Notes: ` Orientation to the Wound Care Program Nursing Diagnoses: Knowledge deficit related to the wound healing center program Goals: Patient/caregiver will verbalize understanding of the [facility] Program Date Initiated: 08/10/2017 Target Resolution Date: 09/08/2017 SCHAE, CANDO ([id number]) Goal Status: Active Interventions: Provide education on orientation to the wound center Notes: ` Pain, Acute or Chronic Nursing Diagnoses: Pain, acute or chronic: actual or potential Potential alteration in comfort, pain Goals: Patient/caregiver will verbalize adequate pain control between visits Date Initiated: 08/10/2017 Target Resolution Date: 12/08/2017 Goal Status: Active Interventions: Complete pain assessment as per visit requirements Notes: ` Wound/Skin Impairment Nursing Diagnoses: Impaired tissue integrity Knowledge deficit related to ulceration/compromised skin integrity Goals: Ulcer/skin breakdown will have a volume reduction of 80% by week 12 Date Initiated: 08/10/2017 Target Resolution Date: 12/01/2017 Goal Status: Active Interventions: Assess patient/caregiver ability to perform ulcer/skin care regimen upon admission and as needed Notes: Electronic Signature(s) Signed: 10/05/2017 4:07:41 PM By: Alric Quan Entered By: Alric Quan on 10/05/2017 09:46:54 Rayo, [name] ([id number]) -------------------------------------------------------------------------------- Pain Assessment Details Patient Name: [name] Date of Service: 10/05/2017 9:45 AM Medical Record Number: [id number] Patient Account Number: [id number] Date of Birth/Sex: July 25, 1923 (83 y.o. Female) Treating RN: Ahmed Prima Primary Care Landyn Buckalew: FITZGERALD,  DAVID Other Clinician: Referring Khamari Yousuf: FITZGERALD, DAVID Treating Jovanna Hodges/Extender: Frann Rider in Treatment: 8 Active Problems Location of Pain Severity and  Description of Pain Patient Has Paino No Site Locations Pain Management and Medication Current Pain Management: Electronic Signature(s) Signed: 10/05/2017 4:07:41 PM By: Alric Quan Entered By: Alric Quan on 10/05/2017 09:39:52 Matthies, [name] ([id number]) -------------------------------------------------------------------------------- Patient/Caregiver Education Details Patient Name: [name] Date of Service: 10/05/2017 9:45 AM Medical Record Number: [id number] Patient Account Number: [id number] Date of Birth/Gender: 16-Aug-1923 (83 y.o. Female) Treating RN: Ahmed Prima Primary Care Physician: FITZGERALD, DAVID Other Clinician: Referring Physician: FITZGERALD, DAVID Treating Physician/Extender: Frann Rider in Treatment: 8 Education Assessment Education Provided To: Patient Education Topics Provided Wound/Skin Impairment: Handouts: Other: change dressing as ordered Methods: Demonstration, Explain/Verbal Responses: State content correctly Electronic Signature(s) Signed: 10/05/2017 4:07:41 PM By: Alric Quan Entered By: Alric Quan on 10/05/2017 09:49:11 Hew, [name] ([id number]) -------------------------------------------------------------------------------- Wound Assessment Details Patient Name: [name] Date of Service: 10/05/2017 9:45 AM Medical Record Number: [id number] Patient Account Number: [id number] Date of Birth/Sex: 07-10-1923 (83 y.o. Female) Treating RN: Ahmed Prima Primary Care Verneal Wiers: FITZGERALD, DAVID Other Clinician: Referring Arlayne Liggins: FITZGERALD, DAVID Treating Ahkeem Goede/Extender: Frann Rider in Treatment: 8 Wound Status Wound Number: 1 Primary Lymphedema Etiology: Wound Location: Right Malleolus -  Lateral Secondary Arterial Insufficiency Ulcer Wounding Event: Gradually Appeared Etiology: Date Acquired: 08/11/2015 Wound Status: Open Weeks Of Treatment: 8 Comorbid Cataracts, Congestive Heart Failure, Clustered Wound: No History: Hypertension, Osteoarthritis, Neuropathy Photos Photo Uploaded By: Alric Quan on 10/05/2017 13:37:20 Wound Measurements Length: (cm) 1.5 Width: (cm) 1.5 Depth: (cm) 0.3 Area: (cm) 1.767 Volume: (cm) 0.53 % Reduction in Area: -15.3% % Reduction in Volume: -15.5% Epithelialization: Small (1-33%) Tunneling: No Wound Description Classification: Partial Thickness Foul Wound Margin: Distinct, outline attached Slou Exudate Amount: Large Exudate Type: Serous Exudate Color: amber Odor After Cleansing: No gh/Fibrino Yes Wound Bed Granulation Amount: Medium (34-66%) Granulation Quality: Red Necrotic Amount: Medium (34-66%) Necrotic Quality: Adherent Slough Periwound Skin Texture Texture Color No Abnormalities Noted: No No Abnormalities Noted: No Erythema: Yes Moisture Brase, [name] ([id number]) No Abnormalities Noted: No Erythema Location: Circumferential Maceration: Yes Temperature / Pain Temperature: No Abnormality Tenderness on Palpation: Yes Wound Preparation Ulcer Cleansing: Rinsed/Irrigated with Saline Topical Anesthetic Applied: Other: lidocaine 4%, Treatment Notes Wound #1 (Right, Lateral Malleolus) 1. Cleansed with: Clean wound with Normal Saline 2. Anesthetic Topical Lidocaine 4% cream to wound bed prior to debridement 3. Peri-wound Care: Antifungal cream 4. Dressing Applied: Santyl Ointment 5. Secondary Dressing Applied Dry [place] Signature(s) Signed: 10/05/2017 4:07:41 PM By: Alric Quan Entered By: Alric Quan on 10/05/2017 09:45:06 Schopf, [name] ([id number]) -------------------------------------------------------------------------------- [place] Details Patient Name:  [name] Date of Service: 10/05/2017 9:45 AM Medical Record Number: [id number] Patient Account Number: [id number] Date of Birth/Sex: 1923-09-29 (83 y.o. Female) Treating RN: Ahmed Prima Primary Care Leverett Camplin: FITZGERALD, DAVID Other Clinician: Referring Traxton Kolenda: FITZGERALD, DAVID Treating Matasha Smigelski/Extender: Frann Rider in Treatment: 8 Vital Signs Time Taken: 09:39 Temperature (F): 98.0 Height (in): 65 Pulse (bpm): 89 Weight (lbs): 154.3 Respiratory Rate (breaths/min): 18 Body Mass Index (BMI): 25.7 Blood Pressure (mmHg): 128/59 Reference Range: 80 - 120 mg / dl Electronic Signature(s) Signed: 10/05/2017 4:07:41 PM By: Alric Quan Entered By: Alric Quan on 10/05/2017 09:41:32

## 2017-10-12 ENCOUNTER — Encounter: Payer: Medicare Other | Admitting: Surgery

## 2017-10-12 DIAGNOSIS — I70233 Atherosclerosis of native arteries of right leg with ulceration of ankle: Secondary | ICD-10-CM | POA: Diagnosis not present

## 2017-10-14 NOTE — Progress Notes (Signed)
MIRIA, CAPPELLI ([id number]) Visit Report for 10/12/2017 Chief Complaint Document Details Patient Name: [name], [name]. Date of Service: 10/12/2017 3:15 PM Medical Record Number: [id number] Patient Account Number: [id number] Date of Birth/Sex: [birth year] (83 y.o. Female) Treating RN: Ahmed Prima Primary Care Provider: FITZGERALD, DAVID Other Clinician: Referring Provider: FITZGERALD, DAVID Treating Provider/Extender: Frann Rider in Treatment: 9 Information Obtained from: Patient Chief Complaint Patient presents to the wound care center for a consult due non healing wound to the right lateral malleolus Electronic Signature(s) Signed: 10/12/2017 3:33:51 PM By: Christin Fudge MD, FACS Entered By: Christin Fudge on 10/12/2017 15:33:51 Freid, [name] ([id number]) -------------------------------------------------------------------------------- HPI Details Patient Name: [name] Date of Service: 10/12/2017 3:15 PM Medical Record Number: [id number] Patient Account Number: [id number] Date of Birth/Sex: 09-08-1923 (83 y.o. Female) Treating RN: Ahmed Prima Primary Care Provider: FITZGERALD, DAVID Other Clinician: Referring Provider: FITZGERALD, DAVID Treating Provider/Extender: Frann Rider in Treatment: 9 History of Present Illness Location: Patient presents with an ulcer on the right lateral malleolus Quality: Patient reports experiencing a dull pain to affected area(s). Severity: Patient states wound are getting worse. Duration: Patient has had the wound for >2 years prior to seeking treatment at the wound center Timing: Pain in wound is constant (hurts all the time) Context: The wound would happen gradually Modifying Factors: Other treatment(s) tried include:seen a podiatrist on a vascular surgeons and under treatment with them Associated Signs and Symptoms: Patient reports having increase swelling. HPI Description: 83 year old patient who most  recently has been seeing both podiatry and vascular surgery for a long- standing ulcer of her right lateral malleolus which has been treated with various methodologies. Dr. Amalia Hailey the podiatrist saw her on 07/20/2017 and sent her to the wound center for possible hyperbaric oxygen therapy. past medical history of peripheral vascular disease, varicose veins, status post appendectomy, basal cell carcinoma excision from the left leg, cholecystectomy, pacemaker placement, right lower extremity angiography done by Dr. dew in March 2017 with placement of a stent. there is also note of a successful ablation of the right small saphenous vein done which was reviewed by ultrasound on 10/24/2016. the patient had a right small saphenous vein ablation done on 10/20/2016. The patient has never been a smoker. She has been seen by Dr. Corene Cornea dew the vascular surgeon who most recently saw her on 06/15/2017 for evaluation of ongoing problems with right leg swelling. She had a lower extremity arterial duplex examination done(02/13/17) which showed patent distal right superficial femoral artery stent and above-the-knee popliteal stent without evidence of restenosis. The ABI was more than 1.3 on the right and more than 1.3 on the left. This was consistent with noncompressible arteries due to medial calcification. The right great toe pressure and PPG waveforms are within normal limits and the left great toe pressure and PPG waveforms are decreased. he recommended she continue to wear her compression stockings and continue with elevation. She is scheduled to have a noninvasive arterial study in the near future 08/16/2017 -- had a lower extremity arterial duplex examination done which showed patent distal right superficial femoral artery stent and above-the-knee popliteal stent without evidence of restenosis. The ABI was more than 1.3 on the right and more than 1.3 on the left. This was consistent with noncompressible  arteries due to medial calcification. The right great toe pressure and PPG waveforms are within normal limits and the left great toe pressure and PPG waveforms are decreased. the x-ray of the right ankle has not yet been done  08/24/2017 -- had a right ankle x-ray -- IMPRESSION:1. No fracture, bone lesion or evidence of osteomyelitis. 2. Lateral soft tissue swelling with a soft tissue ulcer. she has not yet seen the vascular surgeon for review 08/31/17 on evaluation today patient's wound appears to be showing signs of improvement. She still with her appointment with vascular in order to review her results of her vascular study and then determine if any intervention would be recommended at that time. No fevers, chills, nausea, or vomiting noted at this time. She has been tolerating the dressing changes without complication. 09/28/17 on evaluation today patient's wound appears to show signs of good improvement in regard to the granulation tissue which is surfacing. There is still a layer of slough covering the wound and the posterior portion is still significantly deeper than the anterior nonetheless there has been some good sign of things moving towards the better. She is going to go back to Dr. dew for reevaluation to ensure her blood flow is still appropriate. That will be before her next evaluation with [place] next week. No fevers, chills, nausea, or vomiting noted at this time. Patient does have some discomfort rated to be a 3-4/10 depending on activity specifically cleansing the wound makes it worse. [name], [name] ([id number]) 10/05/2017 -- the patient was seen by Dr. dew last week and noninvasive studies showed a normal right ABI with brisk triphasic waveforms consistent with no arterial insufficiency including normal digital pressures. The duplex showed a patent distal right SFA stent and the proximal SFA was also normal. He was pleased with her test and thought she should have enough of  perfusion for normal wound healing. He would see her back in 6 months time. Electronic Signature(s) Signed: 10/12/2017 3:33:57 PM By: Christin Fudge MD, FACS Entered By: Christin Fudge on 10/12/2017 15:33:57 Coletta, [name] ([id number]) -------------------------------------------------------------------------------- Physical Exam Details Patient Name: [name] Date of Service: 10/12/2017 3:15 PM Medical Record Number: [id number] Patient Account Number: [id number] Date of Birth/Sex: 10-24-1923 (83 y.o. Female) Treating RN: Ahmed Prima Primary Care Provider: FITZGERALD, DAVID Other Clinician: Referring Provider: FITZGERALD, DAVID Treating Provider/Extender: Frann Rider in Treatment: 9 Constitutional . Pulse regular. Respirations normal and unlabored. Afebrile. . Eyes Nonicteric. Reactive to light. Ears, Nose, Mouth, and Throat Lips, teeth, and gums WNL.Marland Kitchen Moist mucosa without lesions. Neck supple and nontender. No palpable supraclavicular or cervical adenopathy. Normal sized without goiter. Respiratory WNL. No retractions.. Cardiovascular Pedal Pulses WNL. No clubbing, cyanosis or edema. Lymphatic No adneopathy. No adenopathy. No adenopathy. Musculoskeletal Adexa without tenderness or enlargement.. Digits and nails w/o clubbing, cyanosis, infection, petechiae, ischemia, or inflammatory conditions.. Integumentary (Hair, Skin) No suspicious lesions. No crepitus or fluctuance. No peri-wound warmth or erythema. No masses.Marland Kitchen Psychiatric Judgement and insight Intact.. No evidence of depression, anxiety, or agitation.. Notes using a gauze and saline I was able to remove a lot of the debris at the depths of the wound and the surrounding fungal infection is still persistent. No sharp debridement was required today. Electronic Signature(s) Signed: 10/12/2017 3:34:42 PM By: Christin Fudge MD, FACS Entered By: Christin Fudge on 10/12/2017 15:34:42 Mcneese, [name]  ([id number]) -------------------------------------------------------------------------------- Physician Orders Details Patient Name: [name] Date of Service: 10/12/2017 3:15 PM Medical Record Number: [id number] Patient Account Number: [id number] Date of Birth/Sex: 06-10-1923 (83 y.o. Female) Treating RN: Ahmed Prima Primary Care Provider: FITZGERALD, DAVID Other Clinician: Referring Provider: FITZGERALD, DAVID Treating Provider/Extender: Frann Rider in Treatment: 9 Verbal / Phone Orders: Yes Clinician: Carolyne Fiscal, Debi Read  Back and Verified: Yes Diagnosis Coding Wound Cleansing Wound #1 Right,Lateral Malleolus o Clean wound with Normal Saline. o Cleanse wound with mild soap and water o May Shower, gently pat wound dry prior to applying new dressing. Anesthetic Wound #1 Right,Lateral Malleolus o Topical Lidocaine 4% cream applied to wound bed prior to debridement - for clinic use Skin Barriers/Peri-Wound Care Wound #1 Right,Lateral Malleolus o Skin Prep o Antifungal cream Primary Wound Dressing Wound #1 Right,Lateral Malleolus o Silvercel Non-Adherent Secondary Dressing Wound #1 Right,Lateral Malleolus o Dry Gauze o Non-adherent pad - telfa island Dressing Change Frequency Wound #1 Right,Lateral Malleolus o Change dressing every day. Follow-up Appointments Wound #1 Right,Lateral Malleolus o Return Appointment in 1 week. Edema Control Wound #1 Right,Lateral Malleolus o Elevate legs to the level of the heart and pump ankles as often as possible o Other: - wear your compression stockings Off-Loading Wound #1 Right,Lateral Malleolus o Turn and reposition every 2 hours o Other: - do not put pressure on your right ankle Garland, [name] ([id number]) Additional Orders / Instructions Wound #1 Right,Lateral Malleolus o Increase protein intake. o Other: - Add Vitamin C, Zinc, Vitamin A to your diet Electronic  Signature(s) Signed: 10/12/2017 3:39:10 PM By: Christin Fudge MD, FACS Signed: 10/12/2017 4:36:47 PM By: Alric Quan Entered By: Alric Quan on 10/12/2017 15:30:46 Mcclaren, [name] ([id number]) -------------------------------------------------------------------------------- Problem List Details Patient Name: [name] Date of Service: 10/12/2017 3:15 PM Medical Record Number: [id number] Patient Account Number: [id number] Date of Birth/Sex: March 19, 1923 (83 y.o. Female) Treating RN: Ahmed Prima Primary Care Provider: FITZGERALD, DAVID Other Clinician: Referring Provider: FITZGERALD, DAVID Treating Provider/Extender: Frann Rider in Treatment: 9 Active Problems ICD-10 Encounter Code Description Active Date Diagnosis L97.312 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d 08/10/2017 Yes I70.233 Atherosclerosis of native arteries of right leg with ulceration of ankle 08/10/2017 Yes I89.0 Lymphedema, not elsewhere classified 08/10/2017 Yes B35.4 Tinea corporis 09/28/2017 Yes Inactive Problems Resolved Problems Electronic Signature(s) Signed: 10/12/2017 3:32:53 PM By: Christin Fudge MD, FACS Entered By: Christin Fudge on 10/12/2017 15:32:53 Shackett, [name] ([id number]) -------------------------------------------------------------------------------- Progress Note Details Patient Name: [name] Date of Service: 10/12/2017 3:15 PM Medical Record Number: [id number] Patient Account Number: [id number] Date of Birth/Sex: 1923-11-17 (83 y.o. Female) Treating RN: Ahmed Prima Primary Care Provider: FITZGERALD, DAVID Other Clinician: Referring Provider: FITZGERALD, DAVID Treating Provider/Extender: Frann Rider in Treatment: 9 Subjective Chief Complaint Information obtained from Patient Patient presents to the wound care center for a consult due non healing wound to the right lateral malleolus History of Present Illness (HPI) The  following HPI elements were documented for the patient's wound: Location: Patient presents with an ulcer on the right lateral malleolus Quality: Patient reports experiencing a dull pain to affected area(s). Severity: Patient states wound are getting worse. Duration: Patient has had the wound for >2 years prior to seeking treatment at the wound center Timing: Pain in wound is constant (hurts all the time) Context: The wound would happen gradually Modifying Factors: Other treatment(s) tried include:seen a podiatrist on a vascular surgeons and under treatment with them Associated Signs and Symptoms: Patient reports having increase swelling. 83 year old patient who most recently has been seeing both podiatry and vascular surgery for a long-standing ulcer of her right lateral malleolus which has been treated with various methodologies. Dr. Amalia Hailey the podiatrist saw her on 07/20/2017 and sent her to the wound center for possible hyperbaric oxygen therapy. past medical history of peripheral vascular disease, varicose veins, status post  appendectomy, basal cell carcinoma excision from the left leg, cholecystectomy, pacemaker placement, right lower extremity angiography done by Dr. dew in March 2017 with placement of a stent. there is also note of a successful ablation of the right small saphenous vein done which was reviewed by ultrasound on 10/24/2016. the patient had a right small saphenous vein ablation done on 10/20/2016. The patient has never been a smoker. She has been seen by Dr. Corene Cornea dew the vascular surgeon who most recently saw her on 06/15/2017 for evaluation of ongoing problems with right leg swelling. She had a lower extremity arterial duplex examination done(02/13/17) which showed patent distal right superficial femoral artery stent and above-the-knee popliteal stent without evidence of restenosis. The ABI was more than 1.3 on the right and more than 1.3 on the left. This was consistent with  noncompressible arteries due to medial calcification. The right great toe pressure and PPG waveforms are within normal limits and the left great toe pressure and PPG waveforms are decreased. he recommended she continue to wear her compression stockings and continue with elevation. She is scheduled to have a noninvasive arterial study in the near future 08/16/2017 -- had a lower extremity arterial duplex examination done which showed patent distal right superficial femoral artery stent and above-the-knee popliteal stent without evidence of restenosis. The ABI was more than 1.3 on the right and more than 1.3 on the left. This was consistent with noncompressible arteries due to medial calcification. The right great toe pressure and PPG waveforms are within normal limits and the left great toe pressure and PPG waveforms are decreased. the x-ray of the right ankle has not yet been done 08/24/2017 -- had a right ankle x-ray -- IMPRESSION:1. No fracture, bone lesion or evidence of osteomyelitis. 2. Lateral soft tissue swelling with a soft tissue ulcer. she has not yet seen the vascular surgeon for review 08/31/17 on evaluation today patient's wound appears to be showing signs of improvement. She still with her appointment with vascular in order to review her results of her vascular study and then determine if any intervention would be recommended at that time. No fevers, chills, nausea, or vomiting noted at this time. She has been tolerating the dressing changes without [name], [name] ([id number]) complication. 09/28/17 on evaluation today patient's wound appears to show signs of good improvement in regard to the granulation tissue which is surfacing. There is still a layer of slough covering the wound and the posterior portion is still significantly deeper than the anterior nonetheless there has been some good sign of things moving towards the better. She is going to go back to Dr. dew for  reevaluation to ensure her blood flow is still appropriate. That will be before her next evaluation with [place] next week. No fevers, chills, nausea, or vomiting noted at this time. Patient does have some discomfort rated to be a 3-4/10 depending on activity specifically cleansing the wound makes it worse. 10/05/2017 -- the patient was seen by Dr. dew last week and noninvasive studies showed a normal right ABI with brisk triphasic waveforms consistent with no arterial insufficiency including normal digital pressures. The duplex showed a patent distal right SFA stent and the proximal SFA was also normal. He was pleased with her test and thought she should have enough of perfusion for normal wound healing. He would see her back in 6 months time. Patient History Information obtained from Patient. Family History Cancer - Father,Siblings, Heart Disease - Siblings, No family history of Diabetes, Hereditary  Spherocytosis, Hypertension, Kidney Disease, Lung Disease, Seizures, Stroke, Thyroid Problems, Tuberculosis. Social History Never smoker, Marital Status - Married, Alcohol Use - Never, Drug Use - No History, Caffeine Use - Rarely. Objective Constitutional Pulse regular. Respirations normal and unlabored. Afebrile. Vitals Time Taken: 3:15 PM, Height: 65 in, Weight: 154.3 lbs, BMI: 25.7, Temperature: 98.0 F, Pulse: 80 bpm, Respiratory Rate: 18 breaths/min, Blood Pressure: 144/72 mmHg. Eyes Nonicteric. Reactive to light. Ears, Nose, Mouth, and Throat Lips, teeth, and gums WNL.Marland Kitchen Moist mucosa without lesions. Neck supple and nontender. No palpable supraclavicular or cervical adenopathy. Normal sized without goiter. Respiratory WNL. No retractions.. Cardiovascular Pedal Pulses WNL. No clubbing, cyanosis or edema. [name], [name] ([id number]) Lymphatic No adneopathy. No adenopathy. No adenopathy. Musculoskeletal Adexa without tenderness or enlargement.. Digits and nails w/o clubbing,  cyanosis, infection, petechiae, ischemia, or inflammatory conditions.Marland Kitchen Psychiatric Judgement and insight Intact.. No evidence of depression, anxiety, or agitation.. General Notes: using a gauze and saline I was able to remove a lot of the debris at the depths of the wound and the surrounding fungal infection is still persistent. No sharp debridement was required today. Integumentary (Hair, Skin) No suspicious lesions. No crepitus or fluctuance. No peri-wound warmth or erythema. No masses.. Wound #1 status is Open. Original cause of wound was Gradually Appeared. The wound is located on the Right,Lateral Malleolus. The wound measures 1.5cm length x 1.1cm width x 0.2cm depth; 1.296cm^2 area and 0.259cm^3 volume. There is no tunneling or undermining noted. There is a large amount of serous drainage noted. The wound margin is distinct with the outline attached to the wound base. There is large (67-100%) red granulation within the wound bed. There is a small (1-33%) amount of necrotic tissue within the wound bed including Adherent Slough. The periwound skin appearance exhibited: Maceration, Erythema. The surrounding wound skin color is noted with erythema which is circumferential. Periwound temperature was noted as No Abnormality. The periwound has tenderness on palpation. Assessment Active Problems ICD-10 L97.312 - 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d I70.233 - Atherosclerosis of native arteries of right leg with ulceration of ankle I89.0 - Lymphedema, not elsewhere classified B35.4 - Tinea corporis Plan Wound Cleansing: Wound #1 Right,Lateral Malleolus: Clean wound with Normal Saline. Cleanse wound with mild soap and water May Shower, gently pat wound dry prior to applying new dressing. Anesthetic: Wound #1 Right,Lateral Malleolus: Topical Lidocaine 4% cream applied to wound bed prior to debridement - for clinic use Skin Barriers/Peri-Wound Care: Wound #1  Right,Lateral Malleolus: Skin Prep Antifungal cream Solivan, [name] ([id number]) Primary Wound Dressing: Wound #1 Right,Lateral Malleolus: Silvercel Non-Adherent Secondary Dressing: Wound #1 Right,Lateral Malleolus: Dry Gauze Non-adherent pad - telfa island Dressing Change Frequency: Wound #1 Right,Lateral Malleolus: Change dressing every day. Follow-up Appointments: Wound #1 Right,Lateral Malleolus: Return Appointment in 1 week. Edema Control: Wound #1 Right,Lateral Malleolus: Elevate legs to the level of the heart and pump ankles as often as possible Other: - wear your compression stockings Off-Loading: Wound #1 Right,Lateral Malleolus: Turn and reposition every 2 hours Other: - do not put pressure on your right ankle Additional Orders / Instructions: Wound #1 Right,Lateral Malleolus: Increase protein intake. Other: - Add Vitamin C, Zinc, Vitamin A to your diet After abrasive debridement and a review, I have recommended: 1. Silver alginate locally to be applied with a bordered foam after washing with soap and water 2. liberal use of the antifungal to the surrounding skin 3. Continue to wear her compression stockings as before 4. elevation and  exercise discussed with her in great detail 5. Regular visits to the wound center Electronic Signature(s) Signed: 10/12/2017 3:36:33 PM By: Christin Fudge MD, FACS Entered By: Christin Fudge on 10/12/2017 15:36:33 Mandarino, [name] ([id number]) -------------------------------------------------------------------------------- ROS/PFSH Details Patient Name: [name] Date of Service: 10/12/2017 3:15 PM Medical Record Number: [id number] Patient Account Number: [id number] Date of Birth/Sex: November 18, 1923 (83 y.o. Female) Treating RN: Ahmed Prima Primary Care Provider: FITZGERALD, DAVID Other Clinician: Referring Provider: FITZGERALD, DAVID Treating Provider/Extender: Frann Rider in Treatment: 9 Information Obtained  From Patient Wound History Do you currently have one or more open woundso Yes How many open wounds do you currently haveo 1 Approximately how long have you had your woundso 2 yrs How have you been treating your wound(s) until nowo mupirocin, soaking in epsom salt Has your wound(s) ever healed and then re-openedo No Have you had any lab work done in the past montho No Have you tested positive for an antibiotic resistant organism (MRSA, VRE)o No Have you tested positive for osteomyelitis (bone infection)o No Have you had any tests for circulation on your legso Yes Who ordered the testo Dr. Lucky Cowboy Where [facility] the test doneo [facility] Medical History: Positive for: Cataracts - surgery Cardiovascular Medical History: Positive for: Congestive Heart Failure; Hypertension Musculoskeletal Medical History: Positive for: Osteoarthritis Neurologic Medical History: Positive for: Neuropathy Oncologic Medical History: Negative for: Received Chemotherapy; Received Radiation HBO Extended History Items Eyes: Cataracts Immunizations Pneumococcal Vaccine: Received Pneumococcal Vaccination: Yes [name], [name] ([id number]) Implantable Devices Family and Social History Cancer: Yes - Father,Siblings; Diabetes: No; Heart Disease: Yes - Siblings; Hereditary Spherocytosis: No; Hypertension: No; Kidney Disease: No; Lung Disease: No; Seizures: No; Stroke: No; Thyroid Problems: No; Tuberculosis: No; Never smoker; Marital Status - Married; Alcohol Use: Never; Drug Use: No History; Caffeine Use: Rarely; Financial Concerns: No; Food, Clothing or Shelter Needs: No; Support System Lacking: No; Transportation Concerns: No; Advanced Directives: No; Patient does not want information on Advanced Directives; Do not resuscitate: No; Living Will: Yes (Not Provided); Medical Power of Attorney: No Physician Affirmation I have reviewed and agree with the above information. Electronic Signature(s) Signed: 10/12/2017  3:39:10 PM By: Christin Fudge MD, FACS Signed: 10/12/2017 4:36:47 PM By: Alric Quan Entered By: Christin Fudge on 10/12/2017 15:34:04 Baylock, [name] ([id number]) -------------------------------------------------------------------------------- SuperBill Details Patient Name: [name] Date of Service: 10/12/2017 Medical Record Number: [id number] Patient Account Number: [id number] Date of Birth/Sex: 1923/06/30 (83 y.o. Female) Treating RN: Ahmed Prima Primary Care Provider: FITZGERALD, DAVID Other Clinician: Referring Provider: FITZGERALD, DAVID Treating Provider/Extender: Frann Rider in Treatment: 9 Diagnosis Coding ICD-10 Codes Code Description K44.010 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d I70.233 Atherosclerosis of native arteries of right leg with ulceration of ankle I89.0 Lymphedema, not elsewhere classified B35.4 Tinea corporis Facility Procedures CPT4 Code: [id number] Description: [address]13 - WOUND CARE VISIT-LEV 3 EST PT Modifier: Quantity: 1 Physician Procedures CPT4 Code: 4034742 Description: 59563 - WC PHYS LEVEL 3 - EST PT ICD-10 Diagnosis Description 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

## 2017-10-14 NOTE — Progress Notes (Signed)
[name] ([id number]) Visit Report for 10/12/2017 Arrival Information Details Patient Name: [name], [name] Date of Service: 10/12/2017 3:15 PM Medical Record Number: [id number] Patient Account Number: [id number] Date of Birth/Sex: 01-21-1923 (83 y.o. Female) Treating RN: Ahmed Prima Primary Care Julieana Eshleman: FITZGERALD, DAVID Other Clinician: Referring Virginia Curl: FITZGERALD, DAVID Treating Zachary Nole/Extender: Frann Rider in Treatment: 9 Visit Information History Since Last Visit All ordered tests and consults were completed: No Patient Arrived: Ambulatory Added or deleted any medications: No Arrival Time: 15:11 Any new allergies or adverse reactions: No Accompanied By: daughter Had a fall or experienced change in No Transfer Assistance: None activities of daily living that may affect Patient Identification Verified: Yes risk of falls: Secondary Verification Process Completed: Yes Signs or symptoms of abuse/neglect since last visito No Patient Requires Transmission-Based No Hospitalized since last visit: No Precautions: Has Dressing in Place as Prescribed: Yes Patient Has Alerts: No Pain Present Now: No Electronic Signature(s) Signed: 10/12/2017 4:36:47 PM By: Alric Quan Entered By: Alric Quan on 10/12/2017 15:15:29 Decicco, [name] ([id number]) -------------------------------------------------------------------------------- Clinic Level of Care Assessment Details Patient Name: [name] Date of Service: 10/12/2017 3:15 PM Medical Record Number: [id number] Patient Account Number: [id number] Date of Birth/Sex: [birth year] (83 y.o. Female) Treating RN: Ahmed Prima Primary Care Brittny Spangle: FITZGERALD, DAVID Other Clinician: Referring Ariadna Setter: FITZGERALD, DAVID Treating Torrion Witter/Extender: Frann Rider in Treatment: 9 Clinic Level of Care Assessment Items TOOL 4 Quantity Score X - Use when only an EandM is performed on  FOLLOW-UP visit 1 0 ASSESSMENTS - Nursing Assessment / Reassessment X - Reassessment of Co-morbidities (includes updates in patient status) 1 10 X- 1 5 Reassessment of Adherence to Treatment Plan ASSESSMENTS - Wound and Skin Assessment / Reassessment X - Simple Wound Assessment / Reassessment - one wound 1 5 []  - 0 Complex Wound Assessment / Reassessment - multiple wounds []  - 0 Dermatologic / Skin Assessment (not related to wound area) ASSESSMENTS - Focused Assessment []  - Circumferential Edema Measurements - multi extremities 0 []  - 0 Nutritional Assessment / Counseling / Intervention []  - 0 Lower Extremity Assessment (monofilament, tuning fork, pulses) []  - 0 Peripheral Arterial Disease Assessment (using hand held doppler) ASSESSMENTS - Ostomy and/or Continence Assessment and Care []  - Incontinence Assessment and Management 0 []  - 0 Ostomy Care Assessment and Management (repouching, etc.) PROCESS - Coordination of Care X - Simple Patient / Family Education for ongoing care 1 15 []  - 0 Complex (extensive) Patient / Family Education for ongoing care []  - 0 Staff obtains Programmer, systems, Records, Test Results / Process Orders []  - 0 Staff telephones HHA, Nursing Homes / Clarify orders / etc []  - 0 Routine Transfer to another Facility (non-emergent condition) []  - 0 Routine Hospital Admission (non-emergent condition) []  - 0 New Admissions / Biomedical engineer / Ordering NPWT, Apligraf, etc. []  - 0 Emergency Hospital Admission (emergent condition) X- 1 10 Simple Discharge Coordination AMAZING, COWMAN ([id number]) []  - 0 Complex (extensive) Discharge Coordination PROCESS - Special Needs []  - Pediatric / Minor Patient Management 0 []  - 0 Isolation Patient Management []  - 0 Hearing / Language / Visual special needs []  - 0 Assessment of Community assistance (transportation, D/C planning, etc.) []  - 0 Additional assistance / Altered mentation []  - 0 Support  Surface(s) Assessment (bed, cushion, seat, etc.) INTERVENTIONS - Wound Cleansing / Measurement X - Simple Wound Cleansing - one wound 1 5 []  - 0 Complex Wound Cleansing - multiple wounds X- 1 5 Wound Imaging (photographs - any  number of wounds) []  - 0 Wound Tracing (instead of photographs) X- 1 5 Simple Wound Measurement - one wound []  - 0 Complex Wound Measurement - multiple wounds INTERVENTIONS - Wound Dressings X - Small Wound Dressing one or multiple wounds 1 10 []  - 0 Medium Wound Dressing one or multiple wounds []  - 0 Large Wound Dressing one or multiple wounds X- 1 5 Application of Medications - topical []  - 0 Application of Medications - injection INTERVENTIONS - Miscellaneous []  - External ear exam 0 []  - 0 Specimen Collection (cultures, biopsies, blood, body fluids, etc.) []  - 0 Specimen(s) / Culture(s) sent or taken to Lab for analysis []  - 0 Patient Transfer (multiple staff / Civil Service fast streamer / Similar devices) []  - 0 Simple Staple / Suture removal (25 or less) []  - 0 Complex Staple / Suture removal (26 or more) []  - 0 Hypo / Hyperglycemic Management (close monitor of Blood Glucose) []  - 0 Ankle / Brachial Index (ABI) - do not check if billed separately X- 1 5 Vital Signs Shealy, [name] ([id number]) Has the patient been seen at the hospital within the last three years: Yes Total Score: 80 Level Of Care: New/Established - Level 3 Electronic Signature(s) Signed: 10/12/2017 4:36:47 PM By: Alric Quan Entered By: Alric Quan on 10/12/2017 16:25:50 Weil, [name] ([id number]) -------------------------------------------------------------------------------- Encounter Discharge Information Details Patient Name: [name] Date of Service: 10/12/2017 3:15 PM Medical Record Number: [id number] Patient Account Number: [id number] Date of Birth/Sex: 30-Oct-1923 (83 y.o. Female) Treating RN: Ahmed Prima Primary Care Daouda Lonzo: FITZGERALD, DAVID  Other Clinician: Referring Willard Farquharson: FITZGERALD, DAVID Treating Fern Canova/Extender: Frann Rider in Treatment: 9 Encounter Discharge Information Items Discharge Pain Level: 0 Discharge Condition: Stable Ambulatory Status: Ambulatory Discharge Destination: Home Transportation: Private Auto Accompanied By: daughter Schedule Follow-up Appointment: Yes Medication Reconciliation completed and No provided to Patient/Care Samari Bittinger: Provided on Clinical Summary of Care: 10/12/2017 Form Type Recipient Paper Patient WS Electronic Signature(s) Signed: 10/12/2017 3:46:51 PM By: Alric Quan Entered By: Alric Quan on 10/12/2017 15:46:50 Cea, [name] ([id number]) -------------------------------------------------------------------------------- Lower Extremity Assessment Details Patient Name: [name] Date of Service: 10/12/2017 3:15 PM Medical Record Number: [id number] Patient Account Number: [id number] Date of Birth/Sex: July 04, 1923 (83 y.o. Female) Treating RN: Ahmed Prima Primary Care Leslie Langille: FITZGERALD, DAVID Other Clinician: Referring Constantin Hillery: FITZGERALD, DAVID Treating Razi Hickle/Extender: Frann Rider in Treatment: 9 Vascular Assessment Pulses: Dorsalis Pedis Palpable: [Right:Yes] Posterior Tibial Extremity colors, hair growth, and conditions: Extremity Color: [Right:Hyperpigmented] Temperature of Extremity: [Right:Warm] Capillary Refill: [Right:< 3 seconds] Electronic Signature(s) Signed: 10/12/2017 4:36:47 PM By: Alric Quan Entered By: Alric Quan on 10/12/2017 15:26:25 Lichtenberger, [name] ([id number]) -------------------------------------------------------------------------------- Multi Wound Chart Details Patient Name: [name] Date of Service: 10/12/2017 3:15 PM Medical Record Number: [id number] Patient Account Number: [id number] Date of Birth/Sex: 05-20-1923 (83 y.o. Female) Treating RN: Ahmed Prima Primary Care Tara Rud: FITZGERALD, DAVID Other Clinician: Referring Deliana Avalos: FITZGERALD, DAVID Treating Karem Farha/Extender: Frann Rider in Treatment: 9 Vital Signs Height(in): 65 Pulse(bpm): 80 Weight(lbs): 154.3 Blood Pressure(mmHg): 144/72 Body Mass Index(BMI): 26 Temperature(F): 98.0 Respiratory Rate 18 (breaths/min): Photos: [1:No Photos] [N/A:N/A] Wound Location: [1:Right Malleolus - Lateral] [N/A:N/A] Wounding Event: [1:Gradually Appeared] [N/A:N/A] Primary Etiology: [1:Lymphedema] [N/A:N/A] Secondary Etiology: [1:Arterial Insufficiency Ulcer] [N/A:N/A] Comorbid History: [1:Cataracts, Congestive Heart Failure, Hypertension, Osteoarthritis, Neuropathy] [N/A:N/A] Date Acquired: [1:08/11/2015] [N/A:N/A] Weeks of Treatment: [1:9] [N/A:N/A] Wound Status: [1:Open] [N/A:N/A] Measurements L x W x D [1:1.5x1.1x0.2] [N/A:N/A] (cm) Area (cm) : [1:1.296] [N/A:N/A] Volume (cm) : [1:0.259] [N/A:N/A] % Reduction in  Area: [1:15.40%] [N/A:N/A] % Reduction in Volume: [1:43.60%] [N/A:N/A] Classification: [1:Partial Thickness] [N/A:N/A] Exudate Amount: [1:Large] [N/A:N/A] Exudate Type: [1:Serous] [N/A:N/A] Exudate Color: [1:amber] [N/A:N/A] Wound Margin: [1:Distinct, outline attached] [N/A:N/A] Granulation Amount: [1:Large (67-100%)] [N/A:N/A] Granulation Quality: [1:Red] [N/A:N/A] Necrotic Amount: [1:Small (1-33%)] [N/A:N/A] Epithelialization: [1:Small (1-33%)] [N/A:N/A] Periwound Skin Texture: [1:No Abnormalities Noted] [N/A:N/A] Periwound Skin Moisture: [1:Maceration: Yes] [N/A:N/A] Periwound Skin Color: [1:Erythema: Yes] [N/A:N/A] Erythema Location: [1:Circumferential] [N/A:N/A] Temperature: [1:No Abnormality] [N/A:N/A] Tenderness on Palpation: [1:Yes] [N/A:N/A] Wound Preparation: [1:Ulcer Cleansing: Rinsed/Irrigated with Saline] [N/A:N/A] Topical Anesthetic Applied: Other: lidocaine 4% CODIE, HAINER ([id number]) Treatment Notes Electronic  Signature(s) Signed: 10/12/2017 3:33:34 PM By: Christin Fudge MD, FACS Entered By: Christin Fudge on 10/12/2017 15:33:34 Lizardi, [name] ([id number]) -------------------------------------------------------------------------------- [place] Details Patient Name: [name] Date of Service: 10/12/2017 3:15 PM Medical Record Number: [id number] Patient Account Number: [id number] Date of Birth/Sex: January 15, 1923 (83 y.o. Female) Treating RN: Ahmed Prima Primary Care Samrat Hayward: FITZGERALD, DAVID Other Clinician: Referring Debroh Sieloff: FITZGERALD, DAVID Treating Deloss Amico/Extender: Frann Rider in Treatment: 9 Active Inactive ` Abuse / Safety / Falls / Self Care Management Nursing Diagnoses: Potential for falls Goals: Patient will not experience any injury related to falls Date Initiated: 08/10/2017 Target Resolution Date: 11/10/2017 Goal Status: Active Interventions: Assess Activities of Daily Living upon admission and as needed Assess fall risk on admission and as needed Assess: immobility, friction, shearing, incontinence upon admission and as needed Notes: ` Nutrition Nursing Diagnoses: Imbalanced nutrition Potential for alteratiion in Nutrition/Potential for imbalanced nutrition Goals: Patient/caregiver agrees to and verbalizes understanding of need to use nutritional supplements and/or vitamins as prescribed Date Initiated: 08/10/2017 Target Resolution Date: 12/08/2017 Goal Status: Active Interventions: Assess patient nutrition upon admission and as needed per policy Notes: ` Orientation to the Wound Care Program Nursing Diagnoses: Knowledge deficit related to the wound healing center program Goals: Patient/caregiver will verbalize understanding of the [facility] [facility] Date Initiated: 08/10/2017 Target Resolution Date: 09/08/2017 KRIPA, FOSKEY ([id number]) Goal Status: Active Interventions: Provide education on orientation to  the wound center Notes: ` Pain, Acute or Chronic Nursing Diagnoses: Pain, acute or chronic: actual or potential Potential alteration in comfort, pain Goals: Patient/caregiver will verbalize adequate pain control between visits Date Initiated: 08/10/2017 Target Resolution Date: 12/08/2017 Goal Status: Active Interventions: Complete pain assessment as per visit requirements Notes: ` Wound/Skin Impairment Nursing Diagnoses: Impaired tissue integrity Knowledge deficit related to ulceration/compromised skin integrity Goals: Ulcer/skin breakdown will have a volume reduction of 80% by week 12 Date Initiated: 08/10/2017 Target Resolution Date: 12/01/2017 Goal Status: Active Interventions: Assess patient/caregiver ability to perform ulcer/skin care regimen upon admission and as needed Notes: Electronic Signature(s) Signed: 10/12/2017 4:36:47 PM By: Alric Quan Entered By: Alric Quan on 10/12/2017 15:26:30 Zettler, [name] ([id number]) -------------------------------------------------------------------------------- Pain Assessment Details Patient Name: [name] Date of Service: 10/12/2017 3:15 PM Medical Record Number: [id number] Patient Account Number: [id number] Date of Birth/Sex: [birth year] (83 y.o. Female) Treating RN: Ahmed Prima Primary Care Masahiro Iglesia: FITZGERALD, DAVID Other Clinician: Referring Purva Vessell: FITZGERALD, DAVID Treating Saharra Santo/Extender: Frann Rider in Treatment: 9 Active Problems Location of Pain Severity and Description of Pain Patient Has Paino No Site Locations Pain Management and Medication Current Pain Management: Electronic Signature(s) Signed: 10/12/2017 4:36:47 PM By: Alric Quan Entered By: Alric Quan on 10/12/2017 15:15:49 Erichsen, [name] ([id number]) -------------------------------------------------------------------------------- Patient/Caregiver Education Details Patient Name: [name] Date of Service: 10/12/2017 3:15 PM Medical Record Number: [id number] Patient Account Number: [id number] Date of Birth/Gender: 1923-03-21 (83 y.o. Female) Treating  RN: Ahmed Prima Primary Care Physician: FITZGERALD, DAVID Other Clinician: Referring Physician: FITZGERALD, DAVID Treating Physician/Extender: Frann Rider in Treatment: 9 Education Assessment Education Provided To: Patient Education Topics Provided Wound/Skin Impairment: Handouts: Other: change dressing as ordered Methods: Demonstration, Explain/Verbal Responses: State content correctly Electronic Signature(s) Signed: 10/12/2017 4:36:47 PM By: Alric Quan Entered By: Alric Quan on 10/12/2017 15:27:24 Minder, [name] ([id number]) -------------------------------------------------------------------------------- Wound Assessment Details Patient Name: [name] Date of Service: 10/12/2017 3:15 PM Medical Record Number: [id number] Patient Account Number: [id number] Date of Birth/Sex: [birth year] (83 y.o. Female) Treating RN: Ahmed Prima Primary Care Jamoni Hewes: FITZGERALD, DAVID Other Clinician: Referring Shakura Cowing: FITZGERALD, DAVID Treating Kiesha Ensey/Extender: Frann Rider in Treatment: 9 Wound Status Wound Number: 1 Primary Lymphedema Etiology: Wound Location: Right Malleolus - Lateral Secondary Arterial Insufficiency Ulcer Wounding Event: Gradually Appeared Etiology: Date Acquired: 08/11/2015 Wound Status: Open Weeks Of Treatment: 9 Comorbid Cataracts, Congestive Heart Failure, Clustered Wound: No History: Hypertension, Osteoarthritis, Neuropathy Photos Photo Uploaded By: Alric Quan on 10/12/2017 16:04:30 Wound Measurements Length: (cm) 1.5 Width: (cm) 1.1 Depth: (cm) 0.2 Area: (cm) 1.296 Volume: (cm) 0.259 % Reduction in Area: 15.4% % Reduction in Volume: 43.6% Epithelialization: Small (1-33%) Tunneling: No Undermining: No Wound  Description Classification: Partial Thickness Foul Wound Margin: Distinct, outline attached Slou Exudate Amount: Large Exudate Type: Serous Exudate Color: amber Odor After Cleansing: No gh/Fibrino Yes Wound Bed Granulation Amount: Large (67-100%) Granulation Quality: Red Necrotic Amount: Small (1-33%) Necrotic Quality: Adherent Slough Periwound Skin Texture Texture Color No Abnormalities Noted: No No Abnormalities Noted: No Erythema: Yes Moisture Soliman, [name] ([id number]) No Abnormalities Noted: No Erythema Location: Circumferential Maceration: Yes Temperature / Pain Temperature: No Abnormality Tenderness on Palpation: Yes Wound Preparation Ulcer Cleansing: Rinsed/Irrigated with Saline Topical Anesthetic Applied: Other: lidocaine 4%, Treatment Notes Wound #1 (Right, Lateral Malleolus) 1. Cleansed with: Clean wound with Normal Saline 2. Anesthetic Topical Lidocaine 4% cream to wound bed prior to debridement 3. Peri-wound Care: Antifungal cream Skin Prep 4. Dressing Applied: Other dressing (specify in notes) 5. Secondary Dressing Applied [place] Notes [employer] Electronic Signature(s) Signed: 10/12/2017 4:36:47 PM By: Alric Quan Entered By: Alric Quan on 10/12/2017 15:23:16 Nichols, [name] ([id number]) -------------------------------------------------------------------------------- [place] Details Patient Name: [name] Date of Service: 10/12/2017 3:15 PM Medical Record Number: [id number] Patient Account Number: [id number] Date of Birth/Sex: March 23, 1923 (83 y.o. Female) Treating RN: Ahmed Prima Primary Care Recie Cirrincione: FITZGERALD, DAVID Other Clinician: Referring Anjuli Gemmill: FITZGERALD, DAVID Treating Raynesha Tiedt/Extender: Frann Rider in Treatment: 9 Vital Signs Time Taken: 15:15 Temperature (F): 98.0 Height (in): 65 Pulse (bpm): 80 Weight (lbs): 154.3 Respiratory Rate (breaths/min): 18 Body Mass Index  (BMI): 25.7 Blood Pressure (mmHg): 144/72 Reference Range: 80 - 120 mg / dl Electronic Signature(s) Signed: 10/12/2017 4:36:47 PM By: Alric Quan Entered By: Alric Quan on 10/12/2017 15:18:40

## 2017-10-19 ENCOUNTER — Encounter: Payer: Medicare Other | Admitting: Surgery

## 2017-10-19 DIAGNOSIS — I70233 Atherosclerosis of native arteries of right leg with ulceration of ankle: Secondary | ICD-10-CM | POA: Diagnosis not present

## 2017-10-21 NOTE — Progress Notes (Signed)
VIETTA, BONIFIELD ([id number]) Visit Report for 10/19/2017 Chief Complaint Document Details Patient Name: [name], [name]. Date of Service: 10/19/2017 12:30 PM Medical Record Number: [id number] Patient Account Number: [id number] Date of Birth/Sex: [birth year] (83 y.o. Female) Treating RN: Ahmed Prima Primary Care Provider: FITZGERALD, DAVID Other Clinician: Referring Provider: FITZGERALD, DAVID Treating Provider/Extender: Frann Rider in Treatment: 10 Information Obtained from: Patient Chief Complaint Patient presents to the wound care center for a consult due non healing wound to the right lateral malleolus Electronic Signature(s) Signed: 10/19/2017 1:09:09 PM By: Christin Fudge MD, FACS Entered By: Christin Fudge on 10/19/2017 13:09:08 [name] ([id number]) -------------------------------------------------------------------------------- Debridement Details Patient Name: [name] Date of Service: 10/19/2017 12:30 PM Medical Record Number: [id number] Patient Account Number: [id number] Date of Birth/Sex: [birth year] (83 y.o. Female) Treating RN: Ahmed Prima Primary Care Provider: FITZGERALD, DAVID Other Clinician: Referring Provider: FITZGERALD, DAVID Treating Provider/Extender: Frann Rider in Treatment: 10 Debridement Performed for Wound #1 Right,Lateral Malleolus Assessment: Performed By: Physician Christin Fudge, MD Debridement: Debridement Severity of Tissue Pre Fat layer exposed Debridement: Pre-procedure Verification/Time Yes - 12:51 Out Taken: Start Time: 12:52 Pain Control: Lidocaine 4% Topical Solution Level: Skin/Subcutaneous Tissue Total Area Debrided (L x W): 1.4 (cm) x 1.1 (cm) = 1.54 (cm) Tissue and other material Viable, Non-Viable, Exudate, Fibrin/Slough, Subcutaneous debrided: Instrument: Curette Bleeding: Minimum Hemostasis Achieved: Pressure End Time: 12:54 Procedural Pain: 0 Post Procedural Pain:  0 Response to Treatment: Procedure was tolerated well Post Debridement Measurements of Total Wound Length: (cm) 1.4 Width: (cm) 1.1 Depth: (cm) 0.3 Volume: (cm) 0.363 Character of Wound/Ulcer Post Debridement: Requires Further Debridement Severity of Tissue Post Debridement: Fat layer exposed Post Procedure Diagnosis Same as Pre-procedure Electronic Signature(s) Signed: 10/19/2017 1:09:00 PM By: Christin Fudge MD, FACS Signed: 10/19/2017 4:11:32 PM By: Alric Quan Entered By: Christin Fudge on 10/19/2017 13:09:00 Saldivar, [name] ([id number]) -------------------------------------------------------------------------------- HPI Details Patient Name: [name] Date of Service: 10/19/2017 12:30 PM Medical Record Number: [id number] Patient Account Number: [id number] Date of Birth/Sex: 05-10-1923 (83 y.o. Female) Treating RN: Ahmed Prima Primary Care Provider: FITZGERALD, DAVID Other Clinician: Referring Provider: FITZGERALD, DAVID Treating Provider/Extender: Frann Rider in Treatment: 10 History of Present Illness Location: Patient presents with an ulcer on the right lateral malleolus Quality: Patient reports experiencing a dull pain to affected area(s). Severity: Patient states wound are getting worse. Duration: Patient has had the wound for >2 years prior to seeking treatment at the wound center Timing: Pain in wound is constant (hurts all the time) Context: The wound would happen gradually Modifying Factors: Other treatment(s) tried include:seen a podiatrist on a vascular surgeons and under treatment with them Associated Signs and Symptoms: Patient reports having increase swelling. 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 Dr. Amalia Hailey the podiatrist saw her on 07/20/2017 and sent her to  the wound center for possible hyperbaric oxygen therapy. past medical history of peripheral vascular disease, varicose veins, status post appendectomy, basal cell carcinoma excision from the left leg, cholecystectomy, pacemaker placement, right lower extremity angiography done by Dr. dew in March 2017 with placement of a stent. there is also note of a successful ablation of the right small saphenous vein done which was reviewed by ultrasound on 10/24/2016. the patient had a right small saphenous vein ablation done on 10/20/2016. The patient has never been a smoker. She has been  seen by Dr. Corene Cornea dew the vascular surgeon who most recently saw her on 06/15/2017 for evaluation of ongoing problems with right leg swelling. She had a lower extremity arterial duplex examination done(02/13/17) which showed patent distal right superficial femoral artery stent and above-the-knee popliteal stent without evidence of restenosis. The ABI was more than 1.3 on the right and more than 1.3 on the left. This was consistent with noncompressible arteries due to medial calcification. The right great toe pressure and PPG waveforms are within normal limits and the left great toe pressure and PPG waveforms are decreased. he recommended she continue to wear her compression stockings and continue with elevation. She is scheduled to have a noninvasive arterial study in the near future 08/16/2017 -- had a lower extremity arterial duplex examination done which showed patent distal right superficial femoral artery stent and above-the-knee popliteal stent without evidence of restenosis. The ABI was more than 1.3 on the right and more than 1.3 on the left. This was consistent with noncompressible arteries due to medial calcification. The right great toe pressure and PPG waveforms are within normal limits and the left great toe pressure and PPG waveforms are decreased. the x-ray of the right ankle has not yet been done 08/24/2017  -- had a right ankle x-ray -- IMPRESSION:1. No fracture, bone lesion or evidence of osteomyelitis. 2. Lateral soft tissue swelling with a soft tissue ulcer. she has not yet seen the vascular surgeon for review 08/31/17 on evaluation today patient's wound appears to be showing signs of improvement. She still with her appointment with vascular in order to review her results of her vascular study and then determine if any intervention would be recommended at that time. No fevers, chills, nausea, or vomiting noted at this time. She has been tolerating the dressing changes without complication. 09/28/17 on evaluation today patient's wound appears to show signs of good improvement in regard to the granulation tissue which is surfacing. There is still a layer of slough covering the wound and the posterior portion is still significantly deeper than the anterior nonetheless there has been some good sign of things moving towards the better. She is going to go back to Dr. dew for reevaluation to ensure her blood flow is still appropriate. That will be before her next evaluation with [place] next week. No fevers, chills, nausea, or vomiting noted at this time. Patient does have some discomfort rated to be a 3-4/10 depending on activity specifically cleansing the wound makes it worse. KEYARAH, MCROY ([id number]) 10/05/2017 -- the patient was seen by Dr. Lucky Cowboy last week and noninvasive studies showed a normal right ABI with brisk triphasic waveforms consistent with no arterial insufficiency including normal digital pressures. The duplex showed a patent distal right SFA stent and the proximal SFA was also normal. He was pleased with her test and thought she should have enough of perfusion for normal wound healing. He would see her back in 6 months time. Electronic Signature(s) Signed: 10/19/2017 1:09:19 PM By: Christin Fudge MD, FACS Entered By: Christin Fudge on 10/19/2017 13:09:19 [name]  ([id number]) -------------------------------------------------------------------------------- Physical Exam Details Patient Name: [name] Date of Service: 10/19/2017 12:30 PM Medical Record Number: [id number] Patient Account Number: [id number] Date of Birth/Sex: October 22, 1923 (83 y.o. Female) Treating RN: Ahmed Prima Primary Care Provider: FITZGERALD, DAVID Other Clinician: Referring Provider: FITZGERALD, DAVID Treating Provider/Extender: Christin Fudge Weeks in Treatment: 10 Constitutional . Pulse regular. Respirations normal and unlabored. Afebrile. . Eyes Nonicteric. Reactive to light. Ears, Nose, Mouth,  and Throat Lips, teeth, and gums WNL.Marland Kitchen Moist mucosa without lesions. Neck supple and nontender. No palpable supraclavicular or cervical adenopathy. Normal sized without goiter. Respiratory WNL. No retractions.. Breath sounds WNL, No rubs, rales, rhonchi, or wheeze.. Cardiovascular Heart rhythm and rate regular, no murmur or gallop.. Pedal Pulses WNL. No clubbing, cyanosis or edema. Lymphatic No adneopathy. No adenopathy. No adenopathy. Musculoskeletal Adexa without tenderness or enlargement.. Digits and nails w/o clubbing, cyanosis, infection, petechiae, ischemia, or inflammatory conditions.. Integumentary (Hair, Skin) No suspicious lesions. No crepitus or fluctuance. No peri-wound warmth or erythema. No masses.Marland Kitchen Psychiatric Judgement and insight Intact.. No evidence of depression, anxiety, or agitation.. Notes sharp debridement was done with a #3 curet and all the subcutaneous debris was removed and also able to note that it does not probe down to bone Electronic Signature(s) Signed: 10/19/2017 1:09:58 PM By: Christin Fudge MD, FACS Entered By: Christin Fudge on 10/19/2017 13:09:58 [name] ([id number]) -------------------------------------------------------------------------------- Physician Orders Details Patient Name: [name] Date of  Service: 10/19/2017 12:30 PM Medical Record Number: [id number] Patient Account Number: [id number] Date of Birth/Sex: January 08, 1923 (83 y.o. Female) Treating RN: Ahmed Prima Primary Care Provider: FITZGERALD, DAVID Other Clinician: Referring Provider: FITZGERALD, DAVID Treating Provider/Extender: Frann Rider in Treatment: 10 Verbal / Phone Orders: Yes Clinician: Pinkerton, Debi Read Back and Verified: Yes Diagnosis Coding Wound Cleansing Wound #1 Right,Lateral Malleolus o Clean wound with Normal Saline. o Cleanse wound with mild soap and water o May Shower, gently pat wound dry prior to applying new dressing. Anesthetic Wound #1 Right,Lateral Malleolus o Topical Lidocaine 4% cream applied to wound bed prior to debridement - for clinic use Skin Barriers/Peri-Wound Care Wound #1 Right,Lateral Malleolus o Skin Prep o Antifungal cream Primary Wound Dressing Wound #1 Right,Lateral Malleolus o Silvercel Non-Adherent Secondary Dressing Wound #1 Right,Lateral Malleolus o Dry Gauze o Non-adherent pad - telfa island Dressing Change Frequency Wound #1 Right,Lateral Malleolus o Change dressing every day. Follow-up Appointments Wound #1 Right,Lateral Malleolus o Return Appointment in 1 week. Edema Control Wound #1 Right,Lateral Malleolus o Elevate legs to the level of the heart and pump ankles as often as possible o Other: - wear your compression stockings Off-Loading Wound #1 Right,Lateral Malleolus o Turn and reposition every 2 hours o Other: - do not put pressure on your right ankle Wold, [name] ([id number]) Additional Orders / Instructions Wound #1 Right,Lateral Malleolus o Increase protein intake. o Other: - Add Vitamin C, Zinc, Vitamin A to your diet Electronic Signature(s) Signed: 10/19/2017 4:11:32 PM By: Alric Quan Signed: 10/19/2017 4:27:13 PM By: Christin Fudge MD, FACS Entered By: Alric Quan on 10/19/2017  12:54:15 Stjulien, [name] ([id number]) -------------------------------------------------------------------------------- Problem List Details Patient Name: [name] Date of Service: 10/19/2017 12:30 PM Medical Record Number: [id number] Patient Account Number: [id number] Date of Birth/Sex: 08/24/1923 (83 y.o. Female) Treating RN: Ahmed Prima Primary Care Provider: FITZGERALD, DAVID Other Clinician: Referring Provider: FITZGERALD, DAVID Treating Provider/Extender: Frann Rider in Treatment: 10 Active Problems ICD-10 Encounter Code Description Active Date Diagnosis L97.312 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d 08/10/2017 Yes I70.233 Atherosclerosis of native arteries of right leg with ulceration of ankle 08/10/2017 Yes I89.0 Lymphedema, not elsewhere classified 08/10/2017 Yes B35.4 Tinea corporis 09/28/2017 Yes Inactive Problems Resolved Problems Electronic Signature(s) Signed: 10/19/2017 1:08:37 PM By: Christin Fudge MD, FACS Entered By: Christin Fudge on 10/19/2017 13:08:37 Kuc, [name] ([id number]) -------------------------------------------------------------------------------- Progress Note Details Patient Name: [name] Date of Service: 10/19/2017 12:30 PM Medical Record Number:  [id number] Patient Account Number: [id number] Date of Birth/Sex: [birth year] (83 y.o. Female) Treating RN: Ahmed Prima Primary Care Provider: FITZGERALD, DAVID Other Clinician: Referring Provider: FITZGERALD, DAVID Treating Provider/Extender: Frann Rider in Treatment: 10 Subjective Chief Complaint Information obtained from Patient Patient presents to the wound care center for a consult due non healing wound to the right lateral malleolus History of Present Illness (HPI) The following HPI elements were documented for the patient's wound: Location: Patient presents with an ulcer on the right lateral malleolus Quality: Patient  reports experiencing a dull pain to affected area(s). Severity: Patient states wound are getting worse. Duration: Patient has had the wound for >2 years prior to seeking treatment at the wound center Timing: Pain in wound is constant (hurts all the time) Context: The wound would happen gradually Modifying Factors: Other treatment(s) tried include:seen a podiatrist on a vascular surgeons and under treatment with them Associated Signs and Symptoms: Patient reports having increase swelling. 83 year old patient who most recently has been seeing both podiatry and vascular surgery for a long-standing ulcer of her right lateral malleolus which has been treated with various methodologies. Dr. Amalia Hailey the podiatrist saw her on 07/20/2017 and sent her to the wound center for possible hyperbaric oxygen therapy. past medical history of peripheral vascular disease, varicose veins, status post appendectomy, basal cell carcinoma excision from the left leg, cholecystectomy, pacemaker placement, right lower extremity angiography done by Dr. dew in March 2017 with placement of a stent. there is also note of a successful ablation of the right small saphenous vein done which was reviewed by ultrasound on 10/24/2016. the patient had a right small saphenous vein ablation done on 10/20/2016. The patient has never been a smoker. She has been seen by Dr. Corene Cornea dew the vascular surgeon who most recently saw her on 06/15/2017 for evaluation of ongoing problems with right leg swelling. She had a lower extremity arterial duplex examination done(02/13/17) which showed patent distal right superficial femoral artery stent and above-the-knee popliteal stent without evidence of restenosis. The ABI was more than 1.3 on the right and more than 1.3 on the left. This was consistent with noncompressible arteries due to medial calcification. The right great toe pressure and PPG waveforms are within normal limits and the left great toe  pressure and PPG waveforms are decreased. he recommended she continue to wear her compression stockings and continue with elevation. She is scheduled to have a noninvasive arterial study in the near future 08/16/2017 -- had a lower extremity arterial duplex examination done which showed patent distal right superficial femoral artery stent and above-the-knee popliteal stent without evidence of restenosis. The ABI was more than 1.3 on the right and more than 1.3 on the left. This was consistent with noncompressible arteries due to medial calcification. The right great toe pressure and PPG waveforms are within normal limits and the left great toe pressure and PPG waveforms are decreased. the x-ray of the right ankle has not yet been done 08/24/2017 -- had a right ankle x-ray -- IMPRESSION:1. No fracture, bone lesion or evidence of osteomyelitis. 2. Lateral soft tissue swelling with a soft tissue ulcer. she has not yet seen the vascular surgeon for review 08/31/17 on evaluation today patient's wound appears to be showing signs of improvement. She still with her appointment with vascular in order to review her results of her vascular study and then determine if any intervention would be recommended at that time. No fevers, chills, nausea, or vomiting noted at this time. She has  been tolerating the dressing changes without KIMIKO, COMMON ([id number]) complication. 09/28/17 on evaluation today patient's wound appears to show signs of good improvement in regard to the granulation tissue which is surfacing. There is still a layer of slough covering the wound and the posterior portion is still significantly deeper than the anterior nonetheless there has been some good sign of things moving towards the better. She is going to go back to Dr. dew for reevaluation to ensure her blood flow is still appropriate. That will be before her next evaluation with [place] next week. No fevers, chills, nausea, or vomiting  noted at this time. Patient does have some discomfort rated to be a 3-4/10 depending on activity specifically cleansing the wound makes it worse. 10/05/2017 -- the patient was seen by Dr. Lucky Cowboy last week and noninvasive studies showed a normal right ABI with brisk triphasic waveforms consistent with no arterial insufficiency including normal digital pressures. The duplex showed a patent distal right SFA stent and the proximal SFA was also normal. He was pleased with her test and thought she should have enough of perfusion for normal wound healing. He would see her back in 6 months time. Patient History Information obtained from Patient. Family History Cancer - Father,Siblings, Heart Disease - Siblings, No family history of Diabetes, Hereditary Spherocytosis, Hypertension, Kidney Disease, Lung Disease, Seizures, Stroke, Thyroid Problems, Tuberculosis. Social History Never smoker, Marital Status - Married, Alcohol Use - Never, Drug Use - No History, Caffeine Use - Rarely. Objective Constitutional Pulse regular. Respirations normal and unlabored. Afebrile. Vitals Time Taken: 12:41 PM, Height: 65 in, Weight: 154.3 lbs, BMI: 25.7, Temperature: 98.0 F, Pulse: 89 bpm, Respiratory Rate: 18 breaths/min, Blood Pressure: 133/69 mmHg. Eyes Nonicteric. Reactive to light. Ears, Nose, Mouth, and Throat Lips, teeth, and gums WNL.Marland Kitchen Moist mucosa without lesions. Neck supple and nontender. No palpable supraclavicular or cervical adenopathy. Normal sized without goiter. Respiratory WNL. No retractions.. Breath sounds WNL, No rubs, rales, rhonchi, or wheeze.. Cardiovascular Heart rhythm and rate regular, no murmur or gallop.. Pedal Pulses WNL. No clubbing, cyanosis or edema. JOAQUINA, NISSEN ([id number]) Lymphatic No adneopathy. No adenopathy. No adenopathy. Musculoskeletal Adexa without tenderness or enlargement.. Digits and nails w/o clubbing, cyanosis, infection, petechiae, ischemia,  or inflammatory conditions.Marland Kitchen Psychiatric Judgement and insight Intact.. No evidence of depression, anxiety, or agitation.. General Notes: sharp debridement was done with a #3 curet and all the subcutaneous debris was removed and also able to note that it does not probe down to bone Integumentary (Hair, Skin) No suspicious lesions. No crepitus or fluctuance. No peri-wound warmth or erythema. No masses.. Wound #1 status is Open. Original cause of wound was Gradually Appeared. The wound is located on the Right,Lateral Malleolus. The wound measures 1.4cm length x 1.1cm width x 0.2cm depth; 1.21cm^2 area and 0.242cm^3 volume. There is no tunneling or undermining noted. There is a large amount of serous drainage noted. The wound margin is distinct with the outline attached to the wound base. There is medium (34-66%) red granulation within the wound bed. There is a medium (34- 66%) amount of necrotic tissue within the wound bed including Adherent Slough. The periwound skin appearance exhibited: Maceration, Erythema. The surrounding wound skin color is noted with erythema which is circumferential. Periwound temperature was noted as No Abnormality. The periwound has tenderness on palpation. Assessment Active Problems ICD-10 L97.312 - 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d I70.233 - Atherosclerosis of native arteries of right leg with ulceration of ankle I89.0 -  Lymphedema, not elsewhere classified B35.4 - Tinea corporis Procedures Wound #1 Pre-procedure diagnosis of Wound #1 is a Lymphedema located on the Right,Lateral Malleolus .Severity of Tissue Pre Debridement is: Fat layer exposed. There was a Skin/Subcutaneous Tissue Debridement ([id number]) debridement with total area of 1.54 sq cm performed by Christin Fudge, MD. with the following instrument(s): Curette to remove Viable and Non- Viable tissue/material including Exudate, Fibrin/Slough, and Subcutaneous after  achieving pain control using Lidocaine 4% Topical Solution. A time out was conducted at 12:51, prior to the start of the procedure. A Minimum amount of bleeding was controlled with Pressure. The procedure was tolerated well with a pain level of 0 throughout and a pain level of 0 following the procedure. Post Debridement Measurements: 1.4cm length x 1.1cm width x 0.3cm depth; 0.363cm^3 volume. Character of Wound/Ulcer Post Debridement requires further debridement. Severity of Tissue Post Debridement is: Fat layer exposed. Post procedure Diagnosis Wound #1: Same as Pre-Procedure Hittle, [name] ([id number]) Plan Wound Cleansing: Wound #1 Right,Lateral Malleolus: Clean wound with Normal Saline. Cleanse wound with mild soap and water May Shower, gently pat wound dry prior to applying new dressing. Anesthetic: Wound #1 Right,Lateral Malleolus: Topical Lidocaine 4% cream applied to wound bed prior to debridement - for clinic use Skin Barriers/Peri-Wound Care: Wound #1 Right,Lateral Malleolus: Skin Prep Antifungal cream Primary Wound Dressing: Wound #1 Right,Lateral Malleolus: Silvercel Non-Adherent Secondary Dressing: Wound #1 Right,Lateral Malleolus: Dry Gauze Non-adherent pad - telfa island Dressing Change Frequency: Wound #1 Right,Lateral Malleolus: Change dressing every day. Follow-up Appointments: Wound #1 Right,Lateral Malleolus: Return Appointment in 1 week. Edema Control: Wound #1 Right,Lateral Malleolus: Elevate legs to the level of the heart and pump ankles as often as possible Other: - wear your compression stockings Off-Loading: Wound #1 Right,Lateral Malleolus: Turn and reposition every 2 hours Other: - do not put pressure on your right ankle Additional Orders / Instructions: Wound #1 Right,Lateral Malleolus: Increase protein intake. Other: - Add Vitamin C, Zinc, Vitamin A to your diet After sharp debridement and a review, I have recommended: 1. Silver  alginate locally to be applied with a bordered foam after washing with soap and water 2. liberal use of the antifungal to the surrounding skin 3. Continue to wear her compression stockings as before 4. elevation and exercise discussed with her in great detail 5. Regular visits to the wound center [name], [name] ([id number]) Electronic Signature(s) Signed: 10/19/2017 1:10:40 PM By: Christin Fudge MD, FACS Entered By: Christin Fudge on 10/19/2017 13:10:40 Banka, [name] ([id number]) -------------------------------------------------------------------------------- ROS/PFSH Details Patient Name: [name] Date of Service: 10/19/2017 12:30 PM Medical Record Number: [id number] Patient Account Number: [id number] Date of Birth/Sex: 10/16/1923 (83 y.o. Female) Treating RN: Ahmed Prima Primary Care Provider: FITZGERALD, DAVID Other Clinician: Referring Provider: FITZGERALD, DAVID Treating Provider/Extender: Frann Rider in Treatment: 10 Information Obtained From Patient Wound History Do you currently have one or more open woundso Yes How many open wounds do you currently haveo 1 Approximately how long have you had your woundso 2 yrs How have you been treating your wound(s) until nowo mupirocin, soaking in epsom salt Has your wound(s) ever healed and then re-openedo No Have you had any lab work done in the past montho No Have you tested positive for an antibiotic resistant organism (MRSA, VRE)o No Have you tested positive for osteomyelitis (bone infection)o No Have you had any tests for circulation on your legso Yes Who ordered the testo Dr. Lucky Cowboy [facility] the test doneo [facility] Medical History: Positive  for: Cataracts - surgery Cardiovascular Medical History: Positive for: Congestive Heart Failure; Hypertension Musculoskeletal Medical History: Positive for: Osteoarthritis Neurologic Medical History: Positive for: Neuropathy Oncologic Medical  History: Negative for: Received Chemotherapy; Received Radiation HBO Extended History Items Eyes: Cataracts Immunizations Pneumococcal Vaccine: Received Pneumococcal Vaccination: Yes TRINITY, HAUN ([id number]) Implantable Devices Family and Social History Cancer: Yes - Father,Siblings; Diabetes: No; Heart Disease: Yes - Siblings; Hereditary Spherocytosis: No; Hypertension: No; Kidney Disease: No; Lung Disease: No; Seizures: No; Stroke: No; Thyroid Problems: No; Tuberculosis: No; Never smoker; Marital Status - Married; Alcohol Use: Never; Drug Use: No History; Caffeine Use: Rarely; Financial Concerns: No; Food, Clothing or Shelter Needs: No; Support System Lacking: No; Transportation Concerns: No; Advanced Directives: No; Patient does not want information on Advanced Directives; Do not resuscitate: No; Living Will: Yes (Not Provided); Medical Power of Attorney: No Physician Affirmation I have reviewed and agree with the above information. Electronic Signature(s) Signed: 10/19/2017 4:11:32 PM By: Alric Quan Signed: 10/19/2017 4:27:13 PM By: Christin Fudge MD, FACS Entered By: Christin Fudge on 10/19/2017 13:09:27 [name] ([id number]) -------------------------------------------------------------------------------- SuperBill Details Patient Name: [name] Date of Service: 10/19/2017 Medical Record Number: [id number] Patient Account Number: [id number] Date of Birth/Sex: 1923/04/02 (83 y.o. 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 I70.233 Atherosclerosis of native arteries of right leg with ulceration I89.0 Lymphedema, not elsewhere  classified B35.4 Tinea corporis Modifier: d of ankle Quantity: 1 Physician Procedures CPT4 Code: 2585277 Description: 82423 - WC PHYS SUBQ TISS 20 SQ CM ICD-10 Diagnosis Description N36.144 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

## 2017-10-23 NOTE — Progress Notes (Signed)
ATOYA, ANDREW ([id number]) Visit Report for 10/19/2017 Arrival Information Details Patient Name: [name], [name] Date of Service: 10/19/2017 12:30 PM Medical Record Number: [id number] Patient Account Number: [id number] Date of Birth/Sex: 12-16-1922 (83 y.o. Female) Treating RN: Ahmed Prima Primary Care Britzy Graul: FITZGERALD, DAVID Other Clinician: Referring Kalai Baca: FITZGERALD, DAVID Treating Kanon Novosel/Extender: Frann Rider in Treatment: 10 Visit Information History Since Last Visit All ordered tests and consults were completed: No Patient Arrived: Ambulatory Added or deleted any medications: No Arrival Time: 12:38 Any new allergies or adverse reactions: No Accompanied By: self Had a fall or experienced change in No Transfer Assistance: None activities of daily living that may affect Patient Identification Verified: Yes risk of falls: Secondary Verification Process Completed: Yes Signs or symptoms of abuse/neglect since last visito No Patient Requires Transmission-Based No Hospitalized since last visit: No Precautions: Has Dressing in Place as Prescribed: Yes Patient Has Alerts: No Pain Present Now: No Electronic Signature(s) Signed: 10/19/2017 4:11:32 PM By: Alric Quan Entered By: Alric Quan on 10/19/2017 12:38:59 Fitzsimons, [name] ([id number]) -------------------------------------------------------------------------------- Encounter Discharge Information Details Patient Name: [name] Date of Service: 10/19/2017 12:30 PM Medical Record Number: [id number] Patient Account Number: [id number] Date of Birth/Sex: [birth year] (83 y.o. Female) Treating RN: Ahmed Prima Primary Care Masae Lukacs: FITZGERALD, DAVID Other Clinician: Referring Kayle Correa: FITZGERALD, DAVID Treating Yolanda Dockendorf/Extender: Frann Rider in Treatment: 10 Encounter Discharge Information Items Discharge Pain Level: 0 Discharge Condition: Stable Ambulatory  Status: Ambulatory Discharge Destination: Home Transportation: Private Auto Accompanied By: daughter Schedule Follow-up Appointment: Yes Medication Reconciliation completed and No provided to Patient/Care Aithana Kushner: Provided on Clinical Summary of Care: 10/19/2017 Form Type Recipient Paper Patient [place] Electronic Signature(s) Signed: 10/23/2017 8:35:27 AM By: Ruthine Dose Previous Signature: 10/19/2017 12:48:11 PM Version By: Alric Quan Entered By: Ruthine Dose on 10/19/2017 13:01:56 Hancock, [name] ([id number]) -------------------------------------------------------------------------------- Lower Extremity Assessment Details Patient Name: [name] Date of Service: 10/19/2017 12:30 PM Medical Record Number: [id number] Patient Account Number: [id number] Date of Birth/Sex: 12-Sep-1923 (83 y.o. Female) Treating RN: Ahmed Prima Primary Care Timiyah Romito: FITZGERALD, DAVID Other Clinician: Referring Abbas Beyene: FITZGERALD, DAVID Treating Tiya Schrupp/Extender: Frann Rider in Treatment: 10 Vascular Assessment Pulses: Dorsalis Pedis Palpable: [Right:Yes] Posterior Tibial Extremity colors, hair growth, and conditions: Extremity Color: [Right:Hyperpigmented] Temperature of Extremity: [Right:Warm] Capillary Refill: [Right:< 3 seconds] Toe Nail Assessment Left: Right: Thick: Yes Discolored: Yes Deformed: Yes Improper Length and Hygiene: No Electronic Signature(s) Signed: 10/19/2017 4:11:32 PM By: Alric Quan Entered By: Alric Quan on 10/19/2017 12:46:13 Miner, [name] ([id number]) -------------------------------------------------------------------------------- Multi Wound Chart Details Patient Name: [name] Date of Service: 10/19/2017 12:30 PM Medical Record Number: [id number] Patient Account Number: [id number] Date of Birth/Sex: 04/15/1923 (83 y.o. Female) Treating RN: Ahmed Prima Primary Care Zabian Swayne: FITZGERALD, DAVID  Other Clinician: Referring Coila Wardell: FITZGERALD, DAVID Treating Ashari Llewellyn/Extender: Frann Rider in Treatment: 10 Vital Signs Height(in): 65 Pulse(bpm): 89 Weight(lbs): 154.3 Blood Pressure(mmHg): 133/69 Body Mass Index(BMI): 26 Temperature(F): 98.0 Respiratory Rate 18 (breaths/min): Photos: [1:No Photos] [N/A:N/A] Wound Location: [1:Right Malleolus - Lateral] [N/A:N/A] Wounding Event: [1:Gradually Appeared] [N/A:N/A] Primary Etiology: [1:Lymphedema] [N/A:N/A] Secondary Etiology: [1:Arterial Insufficiency Ulcer] [N/A:N/A] Comorbid History: [1:Cataracts, Congestive Heart Failure, Hypertension, Osteoarthritis, Neuropathy] [N/A:N/A] Date Acquired: [1:08/11/2015] [N/A:N/A] Weeks of Treatment: [1:10] [N/A:N/A] Wound Status: [1:Open] [N/A:N/A] Measurements L x W x D [1:1.4x1.1x0.2] [N/A:N/A] (cm) Area (cm) : [1:1.21] [N/A:N/A] Volume (cm) : [1:0.242] [N/A:N/A] % Reduction in Area: [1:21.00%] [N/A:N/A] % Reduction in Volume: [1:47.30%] [N/A:N/A] Classification: [1:Partial Thickness] [N/A:N/A] Exudate Amount: [1:Large] [N/A:N/A] Exudate Type: [1:Serous] [N/A:N/A] Exudate Color: [  1:amber] [N/A:N/A] Wound Margin: [1:Distinct, outline attached] [N/A:N/A] Granulation Amount: [1:Medium (34-66%)] [N/A:N/A] Granulation Quality: [1:Red] [N/A:N/A] Necrotic Amount: [1:Medium (34-66%)] [N/A:N/A] Epithelialization: [1:Small (1-33%)] [N/A:N/A] Debridement: [1:Debridement ([id number])] [N/A:N/A] Pre-procedure [1:12:51] [N/A:N/A] Verification/Time Out Taken: Pain Control: [1:Lidocaine 4% Topical Solution] [N/A:N/A] Tissue Debrided: [1:Fibrin/Slough, Exudates, Subcutaneous] [N/A:N/A] Level: [1:Skin/Subcutaneous Tissue] [N/A:N/A] Debridement Area (sq cm): [1:1.54] [N/A:N/A] Instrument: [1:Curette] [N/A:N/A] Bleeding: [1:Minimum] [N/A:N/A] Hemostasis Achieved: [1:Pressure] [N/A:N/A] Procedural Pain: 0 N/A N/A Post Procedural Pain: 0 N/A N/A Debridement Treatment Procedure was  tolerated well N/A N/A Response: Post Debridement 1.4x1.1x0.3 N/A N/A Measurements L x W x D (cm) Post Debridement Volume: 0.363 N/A N/A (cm) Periwound Skin Texture: No Abnormalities Noted N/A N/A Periwound Skin Moisture: Maceration: Yes N/A N/A Periwound Skin Color: Erythema: Yes N/A N/A Erythema Location: Circumferential N/A N/A Temperature: No Abnormality N/A N/A Tenderness on Palpation: Yes N/A N/A Wound Preparation: Ulcer Cleansing: N/A N/A Rinsed/Irrigated with Saline Topical Anesthetic Applied: Other: lidocaine 4% Procedures Performed: Debridement N/A N/A Treatment Notes Wound #1 (Right, Lateral Malleolus) 1. Cleansed with: Clean wound with Normal Saline 2. Anesthetic Topical Lidocaine 4% cream to wound bed prior to debridement 3. Peri-wound Care: Antifungal cream Skin Prep 4. Dressing Applied: Other dressing (specify in notes) 5. Secondary [place] Notes [facility] Electronic Signature(s) Signed: 10/19/2017 1:08:50 PM By: Christin Fudge MD, FACS Previous Signature: 10/19/2017 12:47:54 PM Version By: Alric Quan Entered By: Christin Fudge on 10/19/2017 13:08:50 [name], [name] ([id number]) -------------------------------------------------------------------------------- [place] Details Patient Name: [name] Date of Service: 10/19/2017 12:30 PM Medical Record Number: [id number] Patient Account Number: [id number] Date of Birth/Sex: 08-Nov-1923 (83 y.o. Female) Treating RN: Ahmed Prima Primary Care Sahalie Beth: FITZGERALD, DAVID Other Clinician: Referring Sailor Hevia: FITZGERALD, DAVID Treating Jovian Lembcke/Extender: Frann Rider in Treatment: 10 Active Inactive ` Abuse / Safety / Falls / Self Care Management Nursing Diagnoses: Potential for falls Goals: Patient will not experience any injury related to falls Date Initiated: 08/10/2017 Target Resolution Date: 11/10/2017 Goal Status:  Active Interventions: Assess Activities of Daily Living upon admission and as needed Assess fall risk on admission and as needed Assess: immobility, friction, shearing, incontinence upon admission and as needed Notes: ` Nutrition Nursing Diagnoses: Imbalanced nutrition Potential for alteratiion in Nutrition/Potential for imbalanced nutrition Goals: Patient/caregiver agrees to and verbalizes understanding of need to use nutritional supplements and/or vitamins as prescribed Date Initiated: 08/10/2017 Target Resolution Date: 12/08/2017 Goal Status: Active Interventions: Assess patient nutrition upon admission and as needed per policy Notes: ` Orientation to the Wound Care Program Nursing Diagnoses: Knowledge deficit related to the wound healing center program Goals: Patient/caregiver will verbalize understanding of the [facility] Date Initiated: 08/10/2017 Target Resolution Date: 09/08/2017 NAIAH, DONAHOE ([id number]) Goal Status: Active Interventions: Provide education on orientation to the wound center Notes: ` Pain, Acute or Chronic Nursing Diagnoses: Pain, acute or chronic: actual or potential Potential alteration in comfort, pain Goals: Patient/caregiver will verbalize adequate pain control between visits Date Initiated: 08/10/2017 Target Resolution Date: 12/08/2017 Goal Status: Active Interventions: Complete pain assessment as per visit requirements Notes: ` Wound/Skin Impairment Nursing Diagnoses: Impaired tissue integrity Knowledge deficit related to ulceration/compromised skin integrity Goals: Ulcer/skin breakdown will have a volume reduction of 80% by week 12 Date Initiated: 08/10/2017 Target Resolution Date: 12/01/2017 Goal Status: Active Interventions: Assess patient/caregiver ability to perform ulcer/skin care regimen upon admission and as needed Notes: Electronic Signature(s) Signed: 10/19/2017 12:47:49 PM By: Alric Quan Entered  By: Alric Quan on 10/19/2017 12:47:48 [name], [name] ([id number]) -------------------------------------------------------------------------------- Pain Assessment  Details Patient Name: [name], [name]. Date of Service: 10/19/2017 12:30 PM Medical Record Number: [id number] Patient Account Number: [id number] Date of Birth/Sex: 1923/02/02 (83 y.o. Female) Treating RN: Ahmed Prima Primary Care Gelila Well: FITZGERALD, DAVID Other Clinician: Referring Jordyan Hardiman: FITZGERALD, DAVID Treating Zacheriah Stumpe/Extender: Frann Rider in Treatment: 10 Active Problems Location of Pain Severity and Description of Pain Patient Has Paino No Site Locations Pain Management and Medication Current Pain Management: Electronic Signature(s) Signed: 10/19/2017 4:11:32 PM By: Alric Quan Entered By: Alric Quan on 10/19/2017 12:39:04 [name], [name] ([id number]) -------------------------------------------------------------------------------- Patient/Caregiver Education Details Patient Name: [name] Date of Service: 10/19/2017 12:30 PM Medical Record Number: [id number] Patient Account Number: [id number] Date of Birth/Gender: 07/16/1923 (83 y.o. Female) Treating RN: Ahmed Prima Primary Care Physician: FITZGERALD, DAVID Other Clinician: Referring Physician: FITZGERALD, DAVID Treating Physician/Extender: Frann Rider in Treatment: 10 Education Assessment Education Provided To: Patient Education Topics Provided Wound/Skin Impairment: Handouts: Other: change dressing as ordered Methods: Demonstration, Explain/Verbal Responses: State content correctly Electronic Signature(s) Signed: 10/19/2017 4:11:32 PM By: Alric Quan Entered By: Alric Quan on 10/19/2017 12:48:26 [name], [name] ([id number]) -------------------------------------------------------------------------------- Wound Assessment Details Patient Name: [name] Date of  Service: 10/19/2017 12:30 PM Medical Record Number: [id number] Patient Account Number: [id number] Date of Birth/Sex: 23-Jan-1923 (83 y.o. Female) Treating RN: Ahmed Prima Primary Care Irelynn Schermerhorn: FITZGERALD, DAVID Other Clinician: Referring Molley Houser: FITZGERALD, DAVID Treating Jendaya Gossett/Extender: Frann Rider in Treatment: 10 Wound Status Wound Number: 1 Primary Lymphedema Etiology: Wound Location: Right Malleolus - Lateral Secondary Arterial Insufficiency Ulcer Wounding Event: Gradually Appeared Etiology: Date Acquired: 08/11/2015 Wound Status: Open Weeks Of Treatment: 10 Comorbid Cataracts, Congestive Heart Failure, Clustered Wound: No History: Hypertension, Osteoarthritis, Neuropathy Photos Photo Uploaded By: Alric Quan on 10/19/2017 16:07:19 Wound Measurements Length: (cm) 1.4 Width: (cm) 1.1 Depth: (cm) 0.2 Area: (cm) 1.21 Volume: (cm) 0.242 % Reduction in Area: 21% % Reduction in Volume: 47.3% Epithelialization: Small (1-33%) Tunneling: No Undermining: No Wound Description Classification: Partial Thickness Foul Wound Margin: Distinct, outline attached Slou Exudate Amount: Large Exudate Type: Serous Exudate Color: amber Odor After Cleansing: No gh/Fibrino Yes Wound Bed Granulation Amount: Medium (34-66%) Granulation Quality: Red Necrotic Amount: Medium (34-66%) Necrotic Quality: Adherent Slough Periwound Skin Texture Texture Color No Abnormalities Noted: No No Abnormalities Noted: No Erythema: Yes Moisture [name], [name] ([id number]) No Abnormalities Noted: No Erythema Location: Circumferential Maceration: Yes Temperature / Pain Temperature: No Abnormality Tenderness on Palpation: Yes Wound Preparation Ulcer Cleansing: Rinsed/Irrigated with Saline Topical Anesthetic Applied: Other: lidocaine 4%, Treatment Notes Wound #1 (Right, Lateral Malleolus) 1. Cleansed with: Clean wound with Normal Saline 2. Anesthetic Topical Lidocaine  4% cream to wound bed prior to debridement 3. Peri-wound Care: Antifungal cream Skin Prep 4. Dressing Applied: Other dressing (specify in notes) 5. Secondary [place] Notes [facility] Electronic Signature(s) Signed: 10/19/2017 4:11:32 PM By: Alric Quan Entered By: Alric Quan on 10/19/2017 12:44:56 [name], [name] ([id number]) -------------------------------------------------------------------------------- [place] Details Patient Name: [name] Date of Service: 10/19/2017 12:30 PM Medical Record Number: [id number] Patient Account Number: [id number] Date of Birth/Sex: 18-Feb-1923 (83 y.o. Female) Treating RN: Ahmed Prima Primary Care Corley Maffeo: FITZGERALD, DAVID Other Clinician: Referring Krystina Strieter: FITZGERALD, DAVID Treating Jendayi Berling/Extender: Frann Rider in Treatment: 10 Vital Signs Time Taken: 12:41 Temperature (F): 98.0 Height (in): 65 Pulse (bpm): 89 Weight (lbs): 154.3 Respiratory Rate (breaths/min): 18 Body Mass Index (BMI): 25.7 Blood Pressure (mmHg): 133/69 Reference Range: 80 - 120 mg / dl Electronic Signature(s) Signed: 10/19/2017 4:11:32 PM By: Alric Quan Entered By:  Alric Quan on 10/19/2017 12:42:18

## 2017-10-26 ENCOUNTER — Encounter: Payer: Medicare Other | Admitting: Surgery

## 2017-11-02 ENCOUNTER — Encounter: Payer: Medicare Other | Admitting: Surgery

## 2017-11-02 DIAGNOSIS — I70233 Atherosclerosis of native arteries of right leg with ulceration of ankle: Secondary | ICD-10-CM | POA: Diagnosis not present

## 2017-11-04 NOTE — Progress Notes (Signed)
ERNESTO, LASHWAY ([id number]) Visit Report for 11/02/2017 Arrival Information Details Patient Name: [name], [name] Date of Service: 11/02/2017 12:30 PM Medical Record Number: [id number] Patient Account Number: [id number] Date of Birth/Sex: 1923/07/31 (83 y.o. Female) Treating RN: Ahmed Prima Primary Care Steffon Gladu: FITZGERALD, DAVID Other Clinician: Referring Shaneese Tait: FITZGERALD, DAVID Treating Alene Bergerson/Extender: Frann Rider in Treatment: 12 Visit Information History Since Last Visit All ordered tests and consults were completed: No Patient Arrived: Ambulatory Added or deleted any medications: No Arrival Time: 12:40 Any new allergies or adverse reactions: No Accompanied By: daughter Had a fall or experienced change in No Transfer Assistance: None activities of daily living that may affect Patient Identification Verified: Yes risk of falls: Secondary Verification Process Completed: Yes Signs or symptoms of abuse/neglect since last visito No Patient Requires Transmission-Based No Hospitalized since last visit: No Precautions: Has Dressing in Place as Prescribed: Yes Patient Has Alerts: No Pain Present Now: No Electronic Signature(s) Signed: 11/02/2017 4:39:39 PM By: Alric Quan Entered By: Alric Quan on 11/02/2017 12:40:59 Venti, [name] ([id number]) -------------------------------------------------------------------------------- Encounter Discharge Information Details Patient Name: [name] Date of Service: 11/02/2017 12:30 PM Medical Record Number: [id number] Patient Account Number: [id number] Date of Birth/Sex: 19-Feb-1923 (83 y.o. Female) Treating RN: Ahmed Prima Primary Care Makaelyn [name]: FITZGERALD, DAVID Other Clinician: Referring Rhythm Wigfall: FITZGERALD, DAVID Treating Dorin Stooksbury/Extender: Frann Rider in Treatment: 12 Encounter Discharge Information Items Discharge Pain Level: 0 Discharge Condition: Stable Ambulatory  Status: Ambulatory Discharge Destination: Home Private Transportation: Auto Accompanied By: daughter Schedule Follow-up Appointment: Yes Medication Reconciliation completed and provided No to Patient/Care Adriyana Greenbaum: Clinical Summary of Care: Electronic Signature(s) Signed: 11/02/2017 4:39:39 PM By: Alric Quan Entered By: Alric Quan on 11/02/2017 12:51:25 Fadden, [name] ([id number]) -------------------------------------------------------------------------------- Lower Extremity Assessment Details Patient Name: [name] Date of Service: 11/02/2017 12:30 PM Medical Record Number: [id number] Patient Account Number: [id number] Date of Birth/Sex: [birth year] (83 y.o. Female) Treating RN: Ahmed Prima Primary Care Elianah Karis: FITZGERALD, DAVID Other Clinician: Referring Armetta Henri: FITZGERALD, DAVID Treating Johny Pitstick/Extender: Frann Rider in Treatment: 12 Vascular Assessment Pulses: Dorsalis Pedis Palpable: [Right:Yes] Posterior Tibial Extremity colors, hair growth, and conditions: Extremity Color: [Right:Hyperpigmented] Temperature of Extremity: [Right:Warm] Capillary Refill: [Right:< 3 seconds] Toe Nail Assessment Left: Right: Thick: Yes Discolored: Yes Deformed: No Improper Length and Hygiene: No Electronic Signature(s) Signed: 11/02/2017 4:39:39 PM By: Alric Quan Entered By: Alric Quan on 11/02/2017 12:48:53 Saling, [name] ([id number]) -------------------------------------------------------------------------------- Multi Wound Chart Details Patient Name: [name] Date of Service: 11/02/2017 12:30 PM Medical Record Number: [id number] Patient Account Number: [id number] Date of Birth/Sex: 1923-07-31 (83 y.o. Female) Treating RN: Ahmed Prima Primary Care Ahri Olson: FITZGERALD, DAVID Other Clinician: Referring Claris Guymon: FITZGERALD, DAVID Treating Ipek Westra/Extender: Frann Rider in Treatment: 12 Vital  Signs Height(in): 65 Pulse(bpm): 88 Weight(lbs): 154.3 Blood Pressure(mmHg): 135/69 Body Mass Index(BMI): 26 Temperature(F): 98.1 Respiratory Rate 18 (breaths/min): Photos: [1:No Photos] [N/A:N/A] Wound Location: [1:Right Malleolus - Lateral] [N/A:N/A] Wounding Event: [1:Gradually Appeared] [N/A:N/A] Primary Etiology: [1:Lymphedema] [N/A:N/A] Secondary Etiology: [1:Arterial Insufficiency Ulcer] [N/A:N/A] Comorbid History: [1:Cataracts, Congestive Heart Failure, Hypertension, Osteoarthritis, Neuropathy] [N/A:N/A] Date Acquired: [1:08/11/2015] [N/A:N/A] Weeks of Treatment: [1:12] [N/A:N/A] Wound Status: [1:Open] [N/A:N/A] Measurements L x W x D [1:1.3x1.3x0.1] [N/A:N/A] (cm) Area (cm) : [1:1.327] [N/A:N/A] Volume (cm) : [1:0.133] [N/A:N/A] % Reduction in Area: [1:13.40%] [N/A:N/A] % Reduction in Volume: [1:71.00%] [N/A:N/A] Classification: [1:Partial Thickness] [N/A:N/A] Exudate Amount: [1:Large] [N/A:N/A] Exudate Type: [1:Serous] [N/A:N/A] Exudate Color: [1:amber] [N/A:N/A] Wound Margin: [1:Distinct, outline attached] [N/A:N/A] Granulation Amount: [1:Small (1-33%)] [N/A:N/A] Granulation Quality: [1:Red] [N/A:N/A] Necrotic  Amount: [1:Large (67-100%)] [N/A:N/A] Epithelialization: [1:Small (1-33%)] [N/A:N/A] Debridement: [1:Debridement (11042-11047)] [N/A:N/A] Pre-procedure [1:12:50] [N/A:N/A] Verification/Time Out Taken: Pain Control: [1:Lidocaine 4% Topical Solution] [N/A:N/A] Tissue Debrided: [1:Fibrin/Slough, Exudates, Subcutaneous] [N/A:N/A] Level: [1:Skin/Subcutaneous Tissue] [N/A:N/A] Debridement Area (sq cm): [1:1.69] [N/A:N/A] Instrument: [1:Curette] [N/A:N/A] Bleeding: [1:Minimum] [N/A:N/A] Hemostasis Achieved: [1:Pressure] [N/A:N/A] Procedural Pain: 0 N/A N/A Post Procedural Pain: 0 N/A N/A Debridement Treatment Procedure was tolerated well N/A N/A Response: Post Debridement 1.3x1.3x0.2 N/A N/A Measurements L x W x D (cm) Post Debridement Volume: 0.265  N/A N/A (cm) Periwound Skin Texture: No Abnormalities Noted N/A N/A Periwound Skin Moisture: Maceration: Yes N/A N/A Periwound Skin Color: Erythema: Yes N/A N/A Erythema Location: Circumferential N/A N/A Temperature: No Abnormality N/A N/A Tenderness on Palpation: Yes N/A N/A Wound Preparation: Ulcer Cleansing: N/A N/A Rinsed/Irrigated with Saline Topical Anesthetic Applied: Other: lidocaine 4% Procedures Performed: Debridement N/A N/A Treatment Notes Wound #1 (Right, Lateral Malleolus) 1. Cleansed with: Clean wound with Normal Saline 2. Anesthetic Topical Lidocaine 4% cream to wound bed prior to debridement 3. Peri-wound Care: Antifungal cream Other peri-wound care (specify in notes) 4. Dressing Applied: Other dressing (specify in notes) 5. Secondary Dressing Applied Dry [place] Notes silvercel, TCA, drawtex Electronic Signature(s) Signed: 11/02/2017 12:57:32 PM By: Christin Fudge MD, FACS Entered By: Christin Fudge on 11/02/2017 12:57:31 Eveland, [name] ([id number]) -------------------------------------------------------------------------------- [place] Details Patient Name: [name] Date of Service: 11/02/2017 12:30 PM Medical Record Number: [id number] Patient Account Number: [id number] Date of Birth/Sex: 1923/10/22 (83 y.o. Female) Treating RN: Ahmed Prima Primary Care Shareef Eddinger: FITZGERALD, DAVID Other Clinician: Referring Orvill Coulthard: FITZGERALD, DAVID Treating Rosena Bartle/Extender: Frann Rider in Treatment: 12 Active Inactive ` Abuse / Safety / Falls / Self Care Management Nursing Diagnoses: Potential for falls Goals: Patient will not experience any injury related to falls Date Initiated: 08/10/2017 Target Resolution Date: 11/10/2017 Goal Status: Active Interventions: Assess Activities of Daily Living upon admission and as needed Assess fall risk on admission and as needed Assess: immobility, friction,  shearing, incontinence upon admission and as needed Notes: ` Nutrition Nursing Diagnoses: Imbalanced nutrition Potential for alteratiion in Nutrition/Potential for imbalanced nutrition Goals: Patient/caregiver agrees to and verbalizes understanding of need to use nutritional supplements and/or vitamins as prescribed Date Initiated: 08/10/2017 Target Resolution Date: 12/08/2017 Goal Status: Active Interventions: Assess patient nutrition upon admission and as needed per policy Notes: ` Orientation to the Wound Care Program Nursing Diagnoses: Knowledge deficit related to the wound healing center program Goals: Patient/caregiver will verbalize understanding of the [facility] Date Initiated: 08/10/2017 Target Resolution Date: 09/08/2017 [name], [name] ([id number]) Goal Status: Active Interventions: Provide education on orientation to the wound center Notes: ` Pain, Acute or Chronic Nursing Diagnoses: Pain, acute or chronic: actual or potential Potential alteration in comfort, pain Goals: Patient/caregiver will verbalize adequate pain control between visits Date Initiated: 08/10/2017 Target Resolution Date: 12/08/2017 Goal Status: Active Interventions: Complete pain assessment as per visit requirements Notes: ` Wound/Skin Impairment Nursing Diagnoses: Impaired tissue integrity Knowledge deficit related to ulceration/compromised skin integrity Goals: Ulcer/skin breakdown will have a volume reduction of 80% by week 12 Date Initiated: 08/10/2017 Target Resolution Date: 12/01/2017 Goal Status: Active Interventions: Assess patient/caregiver ability to perform ulcer/skin care regimen upon admission and as needed Notes: Electronic Signature(s) Signed: 11/02/2017 4:39:39 PM By: Alric Quan Entered By: Alric Quan on 11/02/2017 12:50:22 Ausley, [name] ([id number]) -------------------------------------------------------------------------------- Pain  Assessment Details Patient Name: [name] Date of Service: 11/02/2017 12:30 PM Medical Record Number: [id number] Patient Account Number:  [id number] Date of Birth/Sex: 19-Jul-1923 (83 y.o. Female) Treating RN: Ahmed Prima Primary Care Shianna Bally: FITZGERALD, DAVID Other Clinician: Referring Leylanie Woodmansee: FITZGERALD, DAVID Treating Kaliann Coryell/Extender: Frann Rider in Treatment: 12 Active Problems Location of Pain Severity and Description of Pain Patient Has Paino No Site Locations Pain Management and Medication Current Pain Management: Electronic Signature(s) Signed: 11/02/2017 4:39:39 PM By: Alric Quan Entered By: Alric Quan on 11/02/2017 12:41:05 Cohea, [name] ([id number]) -------------------------------------------------------------------------------- Patient/Caregiver Education Details Patient Name: [name] Date of Service: 11/02/2017 12:30 PM Medical Record Number: [id number] Patient Account Number: [id number] Date of Birth/Gender: 1923-01-02 (83 y.o. Female) Treating RN: Ahmed Prima Primary Care Physician: FITZGERALD, DAVID Other Clinician: Referring Physician: FITZGERALD, DAVID Treating Physician/Extender: Frann Rider in Treatment: 12 Education Assessment Education Provided To: Patient Education Topics Provided Wound/Skin Impairment: Handouts: Caring for Your Ulcer, Other: change dressing as ordered Methods: Demonstration, Explain/Verbal Responses: State content correctly Electronic Signature(s) Signed: 11/02/2017 4:39:39 PM By: Alric Quan Entered By: Alric Quan on 11/02/2017 12:51:42 Hodder, [name] ([id number]) -------------------------------------------------------------------------------- Wound Assessment Details Patient Name: [name] Date of Service: 11/02/2017 12:30 PM Medical Record Number: [id number] Patient Account Number: [id number] Date of Birth/Sex: [birth year] (83 y.o.  Female) Treating RN: Ahmed Prima Primary Care Arshia Rondon: FITZGERALD, DAVID Other Clinician: Referring Haruko Mersch: FITZGERALD, DAVID Treating Yordin Rhoda/Extender: Frann Rider in Treatment: 12 Wound Status Wound Number: 1 Primary Lymphedema Etiology: Wound Location: Right Malleolus - Lateral Secondary Arterial Insufficiency Ulcer Wounding Event: Gradually Appeared Etiology: Date Acquired: 08/11/2015 Wound Status: Open Weeks Of Treatment: 12 Comorbid Cataracts, Congestive Heart Failure, Clustered Wound: No History: Hypertension, Osteoarthritis, Neuropathy Photos Photo Uploaded By: Alric Quan on 11/02/2017 16:39:02 Wound Measurements Length: (cm) 1.3 Width: (cm) 1.3 Depth: (cm) 0.1 Area: (cm) 1.327 Volume: (cm) 0.133 % Reduction in Area: 13.4% % Reduction in Volume: 71% Epithelialization: Small (1-33%) Tunneling: No Undermining: No Wound Description Classification: Partial Thickness Foul Wound Margin: Distinct, outline attached Slou Exudate Amount: Large Exudate Type: Serous Exudate Color: amber Odor After Cleansing: No gh/Fibrino Yes Wound Bed Granulation Amount: Small (1-33%) Granulation Quality: Red Necrotic Amount: Large (67-100%) Necrotic Quality: Adherent Slough Periwound Skin Texture Texture Color No Abnormalities Noted: No No Abnormalities Noted: No Erythema: Yes Moisture Carlberg, [name] ([id number]) No Abnormalities Noted: No Erythema Location: Circumferential Maceration: Yes Temperature / Pain Temperature: No Abnormality Tenderness on Palpation: Yes Wound Preparation Ulcer Cleansing: Rinsed/Irrigated with Saline Topical Anesthetic Applied: Other: lidocaine 4%, Treatment Notes Wound #1 (Right, Lateral Malleolus) 1. Cleansed with: Clean wound with Normal Saline 2. Anesthetic Topical Lidocaine 4% cream to wound bed prior to debridement 3. Peri-wound Care: Antifungal cream Other peri-wound care (specify in notes) 4. Dressing  Applied: Other dressing (specify in notes) 5. Secondary Dressing Applied Dry [place] Notes silvercel, TCA, drawtex Electronic Signature(s) Signed: 11/02/2017 4:39:39 PM By: Alric Quan Entered By: Alric Quan on 11/02/2017 12:47:25 [place], [name] ([id number]) -------------------------------------------------------------------------------- [place] Details Patient Name: [name] Date of Service: 11/02/2017 12:30 PM Medical Record Number: [id number] Patient Account Number: [id number] Date of Birth/Sex: 1922/12/15 (83 y.o. Female) Treating RN: Ahmed Prima Primary Care Clell Trahan: FITZGERALD, DAVID Other Clinician: Referring Florabelle Cardin: FITZGERALD, DAVID Treating Haylo [name]/Extender: Frann Rider in Treatment: 12 Vital Signs Time Taken: 12:41 Temperature (F): 98.1 Height (in): 65 Pulse (bpm): 88 Weight (lbs): 154.3 Respiratory Rate (breaths/min): 18 Body Mass Index (BMI): 25.7 Blood Pressure (mmHg): 135/69 Reference Range: 80 - 120 mg / dl Electronic Signature(s) Signed: 11/02/2017 4:39:39 PM By: Alric Quan Entered By: Alric Quan on 11/02/2017 12:42:55

## 2017-11-04 NOTE — Progress Notes (Signed)
[name], [name] ([id number]) Visit Report for 11/02/2017 Chief Complaint Document Details Patient Name: [name], [name]. Date of Service: 11/02/2017 12:30 PM Medical Record Number: [id number] Patient Account Number: [id number] Date of Birth/Sex: 1923-05-14 (83 y.o. Female) Treating RN: Ahmed Prima Primary Care Provider: FITZGERALD, DAVID Other Clinician: Referring Provider: FITZGERALD, DAVID Treating Provider/Extender: Frann Rider in Treatment: 12 Information Obtained from: Patient Chief Complaint Patient presents to the wound care center for a consult due non healing wound to the right lateral malleolus Electronic Signature(s) Signed: 11/02/2017 12:57:44 PM By: Christin Fudge MD, FACS Entered By: Christin Fudge on 11/02/2017 12:57:44 [name], [name] ([id number]) -------------------------------------------------------------------------------- Debridement Details Patient Name: [name] Date of Service: 11/02/2017 12:30 PM Medical Record Number: [id number] Patient Account Number: [id number] Date of Birth/Sex: 05-Apr-1923 (83 y.o. Female) Treating RN: Ahmed Prima Primary Care Provider: FITZGERALD, DAVID Other Clinician: Referring Provider: FITZGERALD, DAVID Treating Provider/Extender: Frann Rider in Treatment: 12 Debridement Performed for Wound #1 Right,Lateral Malleolus Assessment: Performed By: Physician Christin Fudge, MD Debridement: Debridement Severity of Tissue Pre Fat layer exposed Debridement: Pre-procedure Verification/Time Yes - 12:50 Out Taken: Start Time: 12:51 Pain Control: Lidocaine 4% Topical Solution Level: Skin/Subcutaneous Tissue Total Area Debrided (L x W): 1.3 (cm) x 1.3 (cm) = 1.69 (cm) Tissue and other material Viable, Non-Viable, Exudate, Fibrin/Slough, Subcutaneous debrided: Instrument: Curette Bleeding: Minimum Hemostasis Achieved: Pressure End Time: 12:53 Procedural Pain: 0 Post Procedural Pain:  0 Response to Treatment: Procedure was tolerated well Post Debridement Measurements of Total Wound Length: (cm) 1.3 Width: (cm) 1.3 Depth: (cm) 0.2 Volume: (cm) 0.265 Character of Wound/Ulcer Post Debridement: Requires Further Debridement Severity of Tissue Post Debridement: Fat layer exposed Post Procedure Diagnosis Same as Pre-procedure Electronic Signature(s) Signed: 11/02/2017 12:57:39 PM By: Christin Fudge MD, FACS Signed: 11/02/2017 4:39:39 PM By: Alric Quan Entered By: Christin Fudge on 11/02/2017 12:57:38 [name], [name] ([id number]) -------------------------------------------------------------------------------- HPI Details Patient Name: [name] Date of Service: 11/02/2017 12:30 PM Medical Record Number: [id number] Patient Account Number: [id number] Date of Birth/Sex: 1923/08/05 (83 y.o. Female) Treating RN: Ahmed Prima Primary Care Provider: FITZGERALD, DAVID Other Clinician: Referring Provider: FITZGERALD, DAVID Treating Provider/Extender: Frann Rider in Treatment: 12 History of Present Illness Location: Patient presents with an ulcer on the right lateral malleolus Quality: Patient reports experiencing a dull pain to affected area(s). Severity: Patient states wound are getting worse. Duration: Patient has had the wound for >2 years prior to seeking treatment at the wound center Timing: Pain in wound is constant (hurts all the time) Context: The wound would happen gradually Modifying Factors: Other treatment(s) tried include:seen a podiatrist on a vascular surgeons and under treatment with them Associated Signs and Symptoms: Patient reports having increase swelling. 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 Dr. Amalia Hailey the podiatrist saw her on 07/20/2017 and sent her to  the wound center for possible hyperbaric oxygen therapy. past medical history of peripheral vascular disease, varicose veins, status post appendectomy, basal cell carcinoma excision from the left leg, cholecystectomy, pacemaker placement, right lower extremity angiography done by Dr. dew in March 2017 with placement of a stent. there is also note of a successful ablation of the right small saphenous vein done which was reviewed by ultrasound on 10/24/2016. the patient had a right small saphenous vein ablation done on 10/20/2016. The patient has never been a smoker. She has been  seen by Dr. Corene Cornea dew the vascular surgeon who most recently saw her on 06/15/2017 for evaluation of ongoing problems with right leg swelling. She had a lower extremity arterial duplex examination done(02/13/17) which showed patent distal right superficial femoral artery stent and above-the-knee popliteal stent without evidence of restenosis. The ABI was more than 1.3 on the right and more than 1.3 on the left. This was consistent with noncompressible arteries due to medial calcification. The right great toe pressure and PPG waveforms are within normal limits and the left great toe pressure and PPG waveforms are decreased. he recommended she continue to wear her compression stockings and continue with elevation. She is scheduled to have a noninvasive arterial study in the near future 08/16/2017 -- had a lower extremity arterial duplex examination done which showed patent distal right superficial femoral artery stent and above-the-knee popliteal stent without evidence of restenosis. The ABI was more than 1.3 on the right and more than 1.3 on the left. This was consistent with noncompressible arteries due to medial calcification. The right great toe pressure and PPG waveforms are within normal limits and the left great toe pressure and PPG waveforms are decreased. the x-ray of the right ankle has not yet been done 08/24/2017  -- had a right ankle x-ray -- IMPRESSION:1. No fracture, bone lesion or evidence of osteomyelitis. 2. Lateral soft tissue swelling with a soft tissue ulcer. she has not yet seen the vascular surgeon for review 08/31/17 on evaluation today patient's wound appears to be showing signs of improvement. She still with her appointment with vascular in order to review her results of her vascular study and then determine if any intervention would be recommended at that time. No fevers, chills, nausea, or vomiting noted at this time. She has been tolerating the dressing changes without complication. 09/28/17 on evaluation today patient's wound appears to show signs of good improvement in regard to the granulation tissue which is surfacing. There is still a layer of slough covering the wound and the posterior portion is still significantly deeper than the anterior nonetheless there has been some good sign of things moving towards the better. She is going to go back to Dr. dew for reevaluation to ensure her blood flow is still appropriate. That will be before her next evaluation with [place] next week. No fevers, chills, nausea, or vomiting noted at this time. Patient does have some discomfort rated to be a 3-4/10 depending on activity specifically cleansing the wound makes it worse. JANIT, CUTTER ([id number]) 10/05/2017 -- the patient was seen by Dr. Lucky Cowboy last week and noninvasive studies showed a normal right ABI with brisk triphasic waveforms consistent with no arterial insufficiency including normal digital pressures. The duplex showed a patent distal right SFA stent and the proximal SFA was also normal. He was pleased with her test and thought she should have enough of perfusion for normal wound healing. He would see her back in 6 months time. Electronic Signature(s) Signed: 11/02/2017 12:57:51 PM By: Christin Fudge MD, FACS Entered By: Christin Fudge on 11/02/2017 12:57:50 [name], [name]  ([id number]) -------------------------------------------------------------------------------- Physical Exam Details Patient Name: [name] Date of Service: 11/02/2017 12:30 PM Medical Record Number: [id number] Patient Account Number: [id number] Date of Birth/Sex: 1923/02/28 (83 y.o. Female) Treating RN: Ahmed Prima Primary Care Provider: FITZGERALD, DAVID Other Clinician: Referring Provider: FITZGERALD, DAVID Treating Provider/Extender: Frann Rider in Treatment: 12 Constitutional . Pulse regular. Respirations normal and unlabored. Afebrile. . Eyes Nonicteric. Reactive to light. Ears, Nose, Mouth,  and Throat Lips, teeth, and gums WNL.Marland Kitchen Moist mucosa without lesions. Neck supple and nontender. No palpable supraclavicular or cervical adenopathy. Normal sized without goiter. Respiratory WNL. No retractions.. Cardiovascular Pedal Pulses WNL. No clubbing, cyanosis or edema. Lymphatic No adneopathy. No adenopathy. No adenopathy. Musculoskeletal Adexa without tenderness or enlargement.. Digits and nails w/o clubbing, cyanosis, infection, petechiae, ischemia, or inflammatory conditions.. Integumentary (Hair, Skin) No suspicious lesions. No crepitus or fluctuance. No peri-wound warmth or erythema. No masses.Marland Kitchen Psychiatric Judgement and insight Intact.. No evidence of depression, anxiety, or agitation.. Notes there is florid fungal infection around the main wound but other than that the wound itself. Sharp debridement and subcutis debris was removed and the base looks clean. It does not probe down to bone. Electronic Signature(s) Signed: 11/02/2017 12:58:30 PM By: Christin Fudge MD, FACS Entered By: Christin Fudge on 11/02/2017 12:58:29 [name] ([id number]) -------------------------------------------------------------------------------- Physician Orders Details Patient Name: [name] Date of Service: 11/02/2017 12:30 PM Medical Record Number:  [id number] Patient Account Number: [id number] Date of Birth/Sex: 02-08-1923 (83 y.o. Female) Treating RN: Ahmed Prima Primary Care Provider: FITZGERALD, DAVID Other Clinician: Referring Provider: FITZGERALD, DAVID Treating Provider/Extender: Frann Rider in Treatment: 12 Verbal / Phone Orders: Yes Clinician: Pinkerton, Debi Read Back and Verified: Yes Diagnosis Coding Wound Cleansing Wound #1 Right,Lateral Malleolus o Clean wound with Normal Saline. o Cleanse wound with mild soap and water o May Shower, gently pat wound dry prior to applying new dressing. Anesthetic Wound #1 Right,Lateral Malleolus o Topical Lidocaine 4% cream applied to wound bed prior to debridement - for clinic use Skin Barriers/Peri-Wound Care Wound #1 Right,Lateral Malleolus o Skin Prep o Antifungal cream o Triamcinolone Acetonide Ointment Primary Wound Dressing Wound #1 Right,Lateral Malleolus o Silvercel Non-Adherent Secondary Dressing Wound #1 Right,Lateral Malleolus o Dry Gauze o Non-adherent pad - telfa island o Drawtex Dressing Change Frequency Wound #1 Right,Lateral Malleolus o Change dressing every day. Follow-up Appointments Wound #1 Right,Lateral Malleolus o Return Appointment in 1 week. Edema Control Wound #1 Right,Lateral Malleolus o Elevate legs to the level of the heart and pump ankles as often as possible o Other: - wear your compression stockings Off-Loading Wound #1 Right,Lateral Malleolus [name], [name] ([id number]) o Turn and reposition every 2 hours o Other: - do not put pressure on your right ankle Additional Orders / Instructions Wound #1 Right,Lateral Malleolus o Increase protein intake. o Other: - Add Vitamin C, Zinc, Vitamin A to your diet Patient Medications Allergies: sulfa, metronidazole, monistat Notifications Medication Indication Start End econazole 11/02/2017 DOSE topical 1 % cream - cream topical as  directed Electronic Signature(s) Signed: 11/02/2017 12:57:16 PM By: Christin Fudge MD, FACS Entered By: Christin Fudge on 11/02/2017 12:57:16 [name], [name] ([id number]) -------------------------------------------------------------------------------- Problem List Details Patient Name: [name] Date of Service: 11/02/2017 12:30 PM Medical Record Number: [id number] Patient Account Number: [id number] Date of Birth/Sex: 19-Oct-1923 (83 y.o. Female) Treating RN: Ahmed Prima Primary Care Provider: FITZGERALD, DAVID Other Clinician: Referring Provider: FITZGERALD, DAVID Treating Provider/Extender: Frann Rider in Treatment: 12 Active Problems ICD-10 Encounter Code Description Active Date Diagnosis L97.312 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d 08/10/2017 Yes I70.233 Atherosclerosis of native arteries of right leg with ulceration of ankle 08/10/2017 Yes I89.0 Lymphedema, not elsewhere classified 08/10/2017 Yes B35.4 Tinea corporis 09/28/2017 Yes Inactive Problems Resolved Problems Electronic Signature(s) Signed: 11/02/2017 12:57:26 PM By: Christin Fudge MD, FACS Entered By: Christin Fudge on 11/02/2017 12:57:26 [name], [name] ([id number]) -------------------------------------------------------------------------------- Progress Note Details Patient Name: [name],  [name]. Date of Service: 11/02/2017 12:30 PM Medical Record Number: [id number] Patient Account Number: [id number] Date of Birth/Sex: 03/17/1923 (83 y.o. Female) Treating RN: Ahmed Prima Primary Care Provider: FITZGERALD, DAVID Other Clinician: Referring Provider: FITZGERALD, DAVID Treating Provider/Extender: Frann Rider in Treatment: 12 Subjective Chief Complaint Information obtained from Patient Patient presents to the wound care center for a consult due non healing wound to the right lateral malleolus History of Present Illness (HPI) The following HPI elements  were documented for the patient's wound: Location: Patient presents with an ulcer on the right lateral malleolus Quality: Patient reports experiencing a dull pain to affected area(s). Severity: Patient states wound are getting worse. Duration: Patient has had the wound for >2 years prior to seeking treatment at the wound center Timing: Pain in wound is constant (hurts all the time) Context: The wound would happen gradually Modifying Factors: Other treatment(s) tried include:seen a podiatrist on a vascular surgeons and under treatment with them Associated Signs and Symptoms: Patient reports having increase swelling. 83 year old patient who most recently has been seeing both podiatry and vascular surgery for a long-standing ulcer of her right lateral malleolus which has been treated with various methodologies. Dr. Amalia Hailey the podiatrist saw her on 07/20/2017 and sent her to the wound center for possible hyperbaric oxygen therapy. past medical history of peripheral vascular disease, varicose veins, status post appendectomy, basal cell carcinoma excision from the left leg, cholecystectomy, pacemaker placement, right lower extremity angiography done by Dr. dew in March 2017 with placement of a stent. there is also note of a successful ablation of the right small saphenous vein done which was reviewed by ultrasound on 10/24/2016. the patient had a right small saphenous vein ablation done on 10/20/2016. The patient has never been a smoker. She has been seen by Dr. Corene Cornea dew the vascular surgeon who most recently saw her on 06/15/2017 for evaluation of ongoing problems with right leg swelling. She had a lower extremity arterial duplex examination done(02/13/17) which showed patent distal right superficial femoral artery stent and above-the-knee popliteal stent without evidence of restenosis. The ABI was more than 1.3 on the right and more than 1.3 on the left. This was consistent with noncompressible  arteries due to medial calcification. The right great toe pressure and PPG waveforms are within normal limits and the left great toe pressure and PPG waveforms are decreased. he recommended she continue to wear her compression stockings and continue with elevation. She is scheduled to have a noninvasive arterial study in the near future 08/16/2017 -- had a lower extremity arterial duplex examination done which showed patent distal right superficial femoral artery stent and above-the-knee popliteal stent without evidence of restenosis. The ABI was more than 1.3 on the right and more than 1.3 on the left. This was consistent with noncompressible arteries due to medial calcification. The right great toe pressure and PPG waveforms are within normal limits and the left great toe pressure and PPG waveforms are decreased. the x-ray of the right ankle has not yet been done 08/24/2017 -- had a right ankle x-ray -- IMPRESSION:1. No fracture, bone lesion or evidence of osteomyelitis. 2. Lateral soft tissue swelling with a soft tissue ulcer. she has not yet seen the vascular surgeon for review 08/31/17 on evaluation today patient's wound appears to be showing signs of improvement. She still with her appointment with vascular in order to review her results of her vascular study and then determine if any intervention would be recommended at that time. No  fevers, chills, nausea, or vomiting noted at this time. She has been tolerating the dressing changes without [name], [name] ([id number]) complication. 09/28/17 on evaluation today patient's wound appears to show signs of good improvement in regard to the granulation tissue which is surfacing. There is still a layer of slough covering the wound and the posterior portion is still significantly deeper than the anterior nonetheless there has been some good sign of things moving towards the better. She is going to go back to Dr. dew for reevaluation to ensure  her blood flow is still appropriate. That will be before her next evaluation with [place] next week. No fevers, chills, nausea, or vomiting noted at this time. Patient does have some discomfort rated to be a 3-4/10 depending on activity specifically cleansing the wound makes it worse. 10/05/2017 -- the patient was seen by Dr. Lucky Cowboy last week and noninvasive studies showed a normal right ABI with brisk triphasic waveforms consistent with no arterial insufficiency including normal digital pressures. The duplex showed a patent distal right SFA stent and the proximal SFA was also normal. He was pleased with her test and thought she should have enough of perfusion for normal wound healing. He would see her back in 6 months time. Patient History Information obtained from Patient. Family History Cancer - Father,Siblings, Heart Disease - Siblings, No family history of Diabetes, Hereditary Spherocytosis, Hypertension, Kidney Disease, Lung Disease, Seizures, Stroke, Thyroid Problems, Tuberculosis. Social History Never smoker, Marital Status - Married, Alcohol Use - Never, Drug Use - No History, Caffeine Use - Rarely. Objective Constitutional Pulse regular. Respirations normal and unlabored. Afebrile. Vitals Time Taken: 12:41 PM, Height: 65 in, Weight: 154.3 lbs, BMI: 25.7, Temperature: 98.1 F, Pulse: 88 bpm, Respiratory Rate: 18 breaths/min, Blood Pressure: 135/69 mmHg. Eyes Nonicteric. Reactive to light. Ears, Nose, Mouth, and Throat Lips, teeth, and gums WNL.Marland Kitchen Moist mucosa without lesions. Neck supple and nontender. No palpable supraclavicular or cervical adenopathy. Normal sized without goiter. Respiratory WNL. No retractions.. Cardiovascular Pedal Pulses WNL. No clubbing, cyanosis or edema. [name], [name] ([id number]) Lymphatic No adneopathy. No adenopathy. No adenopathy. Musculoskeletal Adexa without tenderness or enlargement.. Digits and nails w/o clubbing, cyanosis, infection,  petechiae, ischemia, or inflammatory conditions.Marland Kitchen Psychiatric Judgement and insight Intact.. No evidence of depression, anxiety, or agitation.. General Notes: there is florid fungal infection around the main wound but other than that the wound itself. Sharp debridement and subcutis debris was removed and the base looks clean. It does not probe down to bone. Integumentary (Hair, Skin) No suspicious lesions. No crepitus or fluctuance. No peri-wound warmth or erythema. No masses.. Wound #1 status is Open. Original cause of wound was Gradually Appeared. The wound is located on the Right,Lateral Malleolus. The wound measures 1.3cm length x 1.3cm width x 0.1cm depth; 1.327cm^2 area and 0.133cm^3 volume. There is no tunneling or undermining noted. There is a large amount of serous drainage noted. The wound margin is distinct with the outline attached to the wound base. There is small (1-33%) red granulation within the wound bed. There is a large (67-100%) amount of necrotic tissue within the wound bed including Adherent Slough. The periwound skin appearance exhibited: Maceration, Erythema. The surrounding wound skin color is noted with erythema which is circumferential. Periwound temperature was noted as No Abnormality. The periwound has tenderness on palpation. Assessment Active Problems ICD-10 L97.312 - 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d I70.233 - Atherosclerosis of native arteries of right leg with ulceration of ankle I89.0 - Lymphedema,  not elsewhere classified B35.4 - Tinea corporis Procedures Wound #1 Pre-procedure diagnosis of Wound #1 is a Lymphedema located on the Right,Lateral Malleolus .Severity of Tissue Pre Debridement is: Fat layer exposed. There was a Skin/Subcutaneous Tissue Debridement ([id number]) debridement with total area of 1.69 sq cm performed by Christin Fudge, MD. with the following instrument(s): Curette to remove Viable and Non- Viable  tissue/material including Exudate, Fibrin/Slough, and Subcutaneous after achieving pain control using Lidocaine 4% Topical Solution. A time out was conducted at 12:50, prior to the start of the procedure. A Minimum amount of bleeding was controlled with Pressure. The procedure was tolerated well with a pain level of 0 throughout and a pain level of 0 following the procedure. Post Debridement Measurements: 1.3cm length x 1.3cm width x 0.2cm depth; 0.265cm^3 volume. Character of Wound/Ulcer Post Debridement requires further debridement. Severity of Tissue Post Debridement is: Fat layer exposed. Post procedure Diagnosis Wound #1: Same as Pre-Procedure Dishner, [name] ([id number]) Plan Wound Cleansing: Wound #1 Right,Lateral Malleolus: Clean wound with Normal Saline. Cleanse wound with mild soap and water May Shower, gently pat wound dry prior to applying new dressing. Anesthetic: Wound #1 Right,Lateral Malleolus: Topical Lidocaine 4% cream applied to wound bed prior to debridement - for clinic use Skin Barriers/Peri-Wound Care: Wound #1 Right,Lateral Malleolus: Skin Prep Antifungal cream Triamcinolone Acetonide Ointment Primary Wound Dressing: Wound #1 Right,Lateral Malleolus: Silvercel Non-Adherent Secondary Dressing: Wound #1 Right,Lateral Malleolus: Dry Gauze Non-adherent pad - telfa island Drawtex Dressing Change Frequency: Wound #1 Right,Lateral Malleolus: Change dressing every day. Follow-up Appointments: Wound #1 Right,Lateral Malleolus: Return Appointment in 1 week. Edema Control: Wound #1 Right,Lateral Malleolus: Elevate legs to the level of the heart and pump ankles as often as possible Other: - wear your compression stockings Off-Loading: Wound #1 Right,Lateral Malleolus: Turn and reposition every 2 hours Other: - do not put pressure on your right ankle Additional Orders / Instructions: Wound #1 Right,Lateral Malleolus: Increase protein intake. Other: - Add  Vitamin C, Zinc, Vitamin A to your diet The following medication(s) was prescribed: econazole topical 1 % cream cream topical as directed starting 11/02/2017 After sharp debridement and a review, I have recommended: 1. Silver alginate with drawtex locally to be applied with a bordered foam after washing with soap and water Blaisdell, [name] ([id number]) 2. liberal use of the antifungal to the surrounding skin -- econazole prescribed today 3. Continue to wear her compression stockings as before 4. elevation and exercise discussed with her in great detail 5. Regular visits to the wound center Electronic Signature(s) Signed: 11/02/2017 12:59:24 PM By: Christin Fudge MD, FACS Entered By: Christin Fudge on 11/02/2017 12:59:24 [name] ([id number]) -------------------------------------------------------------------------------- ROS/PFSH Details Patient Name: [name] Date of Service: 11/02/2017 12:30 PM Medical Record Number: [id number] Patient Account Number: [id number] Date of Birth/Sex: 1923-01-30 (83 y.o. Female) Treating RN: Ahmed Prima Primary Care Provider: FITZGERALD, DAVID Other Clinician: Referring Provider: FITZGERALD, DAVID Treating Provider/Extender: Frann Rider in Treatment: 12 Information Obtained From Patient Wound History Do you currently have one or more open woundso Yes How many open wounds do you currently haveo 1 Approximately how long have you had your woundso 2 yrs How have you been treating your wound(s) until nowo mupirocin, soaking in epsom salt Has your wound(s) ever healed and then re-openedo No Have you had any lab work done in the past montho No Have you tested positive for an antibiotic resistant organism (MRSA, VRE)o No Have you tested positive for osteomyelitis (bone infection)o No Have you  had any tests for circulation on your legso Yes Who ordered the testo Dr. Lucky Cowboy [facility]  History: Positive for: Cataracts - surgery Cardiovascular Medical History: Positive for: Congestive Heart Failure; Hypertension Musculoskeletal Medical History: Positive for: Osteoarthritis Neurologic Medical History: Positive for: Neuropathy Oncologic Medical History: Negative for: Received Chemotherapy; Received Radiation HBO Extended History Items Eyes: Cataracts Immunizations Pneumococcal Vaccine: Received Pneumococcal Vaccination: Yes CRISTIANA, YOCHIM ([id number]) Implantable Devices Family and Social History Cancer: Yes - Father,Siblings; Diabetes: No; Heart Disease: Yes - Siblings; Hereditary Spherocytosis: No; Hypertension: No; Kidney Disease: No; Lung Disease: No; Seizures: No; Stroke: No; Thyroid Problems: No; Tuberculosis: No; Never smoker; Marital Status - Married; Alcohol Use: Never; Drug Use: No History; Caffeine Use: Rarely; Financial Concerns: No; Food, Clothing or Shelter Needs: No; Support System Lacking: No; Transportation Concerns: No; Advanced Directives: No; Patient does not want information on Advanced Directives; Do not resuscitate: No; Living Will: Yes (Not Provided); Medical Power of Attorney: No Physician Affirmation I have reviewed and agree with the above information. Electronic Signature(s) Signed: 11/02/2017 4:39:39 PM By: Alric Quan Signed: 11/02/2017 4:42:19 PM By: Christin Fudge MD, FACS Entered By: Christin Fudge on 11/02/2017 12:58:00 Mincey, [name] ([id number]) -------------------------------------------------------------------------------- SuperBill Details Patient Name: [name] Date of Service: 11/02/2017 Medical Record Number: [id number] Patient Account Number: [id number] Date of Birth/Sex: 1923/10/19 (83 y.o. 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 I70.233 Atherosclerosis of native arteries of right leg with ulceration of ankle I89.0 Lymphedema, not elsewhere classified B35.4 Tinea corporis Facility Procedures CPT4 Code: [id number] Description: 34196 - DEB SUBQ TISSUE 20 SQ CM/< ICD-10 Diagnosis Description L97.312 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 I70.233 Atherosclerosis of native arteries of right leg with ulceration I89.0 Lymphedema, not elsewhere  classified B35.4 Tinea corporis Modifier: d of ankle Quantity: 1 Physician Procedures CPT4 Code: 2229798 Description: 92119 - WC PHYS SUBQ TISS 20 SQ CM ICD-10 Diagnosis Description E17.408 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

## 2017-11-09 ENCOUNTER — Encounter: Payer: Medicare Other | Attending: Surgery | Admitting: Surgery

## 2017-11-09 DIAGNOSIS — I70233 Atherosclerosis of native arteries of right leg with ulceration of ankle: Secondary | ICD-10-CM | POA: Diagnosis not present

## 2017-11-09 DIAGNOSIS — Z950 Presence of cardiac pacemaker: Secondary | ICD-10-CM | POA: Diagnosis not present

## 2017-11-09 DIAGNOSIS — B354 Tinea corporis: Secondary | ICD-10-CM | POA: Insufficient documentation

## 2017-11-09 DIAGNOSIS — I110 Hypertensive heart disease with heart failure: Secondary | ICD-10-CM | POA: Diagnosis not present

## 2017-11-09 DIAGNOSIS — I890 Lymphedema, not elsewhere classified: Secondary | ICD-10-CM | POA: Insufficient documentation

## 2017-11-09 DIAGNOSIS — Z85828 Personal history of other malignant neoplasm of skin: Secondary | ICD-10-CM | POA: Insufficient documentation

## 2017-11-09 DIAGNOSIS — L97312 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d: Secondary | ICD-10-CM | POA: Insufficient documentation

## 2017-11-09 DIAGNOSIS — Z79899 Other long term (current) drug therapy: Secondary | ICD-10-CM | POA: Diagnosis not present

## 2017-11-09 DIAGNOSIS — G629 Polyneuropathy, unspecified: Secondary | ICD-10-CM | POA: Insufficient documentation

## 2017-11-09 DIAGNOSIS — M1990 Unspecified osteoarthritis, unspecified site: Secondary | ICD-10-CM | POA: Insufficient documentation

## 2017-11-09 DIAGNOSIS — N3946 Mixed incontinence: Secondary | ICD-10-CM | POA: Insufficient documentation

## 2017-11-09 DIAGNOSIS — I509 Heart failure, unspecified: Secondary | ICD-10-CM | POA: Diagnosis not present

## 2017-11-09 DIAGNOSIS — Z7982 Long term (current) use of aspirin: Secondary | ICD-10-CM | POA: Insufficient documentation

## 2017-11-14 NOTE — Progress Notes (Signed)
DIMOND, CROTTY ([id number]) Visit Report for 11/09/2017 Chief Complaint Document Details Patient Name: [name], [name]. Date of Service: 11/09/2017 12:30 PM Medical Record Number: [id number] Patient Account Number: [id number] Date of Birth/Sex: September 29, 1923 (83 y.o. Female) Treating RN: Cornell Barman Primary Care Provider: FITZGERALD, DAVID Other Clinician: Referring Provider: FITZGERALD, DAVID Treating Provider/Extender: Frann Rider in Treatment: 13 Information Obtained from: Patient Chief Complaint Patient presents to the wound care center for a consult due non healing wound to the right lateral malleolus Electronic Signature(s) Signed: 11/09/2017 1:01:57 PM By: Christin Fudge MD, FACS Entered By: Christin Fudge on 11/09/2017 13:01:57 Schrom, [name] ([id number]) -------------------------------------------------------------------------------- Debridement Details Patient Name: [name] Date of Service: 11/09/2017 12:30 PM Medical Record Number: [id number] Patient Account Number: [id number] Date of Birth/Sex: 1923-08-20 (83 y.o. Female) Treating RN: Cornell Barman Primary Care Provider: FITZGERALD, DAVID Other Clinician: Referring Provider: FITZGERALD, DAVID Treating Provider/Extender: Frann Rider in Treatment: 13 Debridement Performed for Wound #1 Right,Lateral Malleolus Assessment: Performed By: Physician Christin Fudge, MD Debridement: Debridement Severity of Tissue Pre Fat layer exposed Debridement: Pre-procedure Verification/Time Yes - 12:50 Out Taken: Start Time: 12:50 Pain Control: Other : lidocaine 4% Level: Skin/Subcutaneous Tissue Total Area Debrided (L x W): 1 (cm) x 1.2 (cm) = 1.2 (cm) Tissue and other material Viable, Non-Viable, Subcutaneous debrided: Instrument: Curette Bleeding: Minimum Hemostasis Achieved: Pressure End Time: 12:55 Procedural Pain: 0 Post Procedural Pain: 0 Response to Treatment: Procedure was tolerated  well Post Debridement Measurements of Total Wound Length: (cm) 1 Width: (cm) 1.2 Depth: (cm) 0.1 Volume: (cm) 0.094 Character of Wound/Ulcer Post Debridement: Requires Further Debridement Severity of Tissue Post Debridement: Fat layer exposed Post Procedure Diagnosis Same as Pre-procedure Notes the fungal infection around the main wound is looking much better Electronic Signature(s) Signed: 11/09/2017 1:01:51 PM By: Christin Fudge MD, FACS Signed: 11/09/2017 5:11:28 PM By: Gretta Cool, BSN, RN, CWS, Kim RN, BSN Entered By: Christin Fudge on 11/09/2017 13:01:50 Foody, [name] ([id number]) -------------------------------------------------------------------------------- HPI Details Patient Name: [name] Date of Service: 11/09/2017 12:30 PM Medical Record Number: [id number] Patient Account Number: [id number] Date of Birth/Sex: 1923-09-03 (83 y.o. Female) Treating RN: Cornell Barman Primary Care Provider: FITZGERALD, DAVID Other Clinician: Referring Provider: FITZGERALD, DAVID Treating Provider/Extender: Frann Rider in Treatment: 13 History of Present Illness Location: Patient presents with an ulcer on the right lateral malleolus Quality: Patient reports experiencing a dull pain to affected area(s). Severity: Patient states wound are getting worse. Duration: Patient has had the wound for >2 years prior to seeking treatment at the wound center Timing: Pain in wound is constant (hurts all the time) Context: The wound would happen gradually Modifying Factors: Other treatment(s) tried include:seen a podiatrist on a vascular surgeons and under treatment with them Associated Signs and Symptoms: Patient reports having increase swelling. 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 Dr. Amalia Hailey the podiatrist saw her  on 07/20/2017 and sent her to the wound center for possible hyperbaric oxygen therapy. past medical history of peripheral vascular disease, varicose veins, status post appendectomy, basal cell carcinoma excision from the left leg, cholecystectomy, pacemaker placement, right lower extremity angiography done by Dr. dew in March 2017 with placement of a stent. there is also note of a successful ablation of the right small saphenous vein done which was reviewed by ultrasound on 10/24/2016. the patient had a right small saphenous  vein ablation done on 10/20/2016. The patient has never been a smoker. She has been seen by Dr. Corene Cornea dew the vascular surgeon who most recently saw her on 06/15/2017 for evaluation of ongoing problems with right leg swelling. She had a lower extremity arterial duplex examination done(02/13/17) which showed patent distal right superficial femoral artery stent and above-the-knee popliteal stent without evidence of restenosis. The ABI was more than 1.3 on the right and more than 1.3 on the left. This was consistent with noncompressible arteries due to medial calcification. The right great toe pressure and PPG waveforms are within normal limits and the left great toe pressure and PPG waveforms are decreased. he recommended she continue to wear her compression stockings and continue with elevation. She is scheduled to have a noninvasive arterial study in the near future 08/16/2017 -- had a lower extremity arterial duplex examination done which showed patent distal right superficial femoral artery stent and above-the-knee popliteal stent without evidence of restenosis. The ABI was more than 1.3 on the right and more than 1.3 on the left. This was consistent with noncompressible arteries due to medial calcification. The right great toe pressure and PPG waveforms are within normal limits and the left great toe pressure and PPG waveforms are decreased. the x-ray of the right ankle has  not yet been done 08/24/2017 -- had a right ankle x-ray -- IMPRESSION:1. No fracture, bone lesion or evidence of osteomyelitis. 2. Lateral soft tissue swelling with a soft tissue ulcer. she has not yet seen the vascular surgeon for review 08/31/17 on evaluation today patient's wound appears to be showing signs of improvement. She still with her appointment with vascular in order to review her results of her vascular study and then determine if any intervention would be recommended at that time. No fevers, chills, nausea, or vomiting noted at this time. She has been tolerating the dressing changes without complication. 09/28/17 on evaluation today patient's wound appears to show signs of good improvement in regard to the granulation tissue which is surfacing. There is still a layer of slough covering the wound and the posterior portion is still significantly deeper than the anterior nonetheless there has been some good sign of things moving towards the better. She is going to go back to Dr. dew for reevaluation to ensure her blood flow is still appropriate. That will be before her next evaluation with [place] next week. No fevers, chills, nausea, or vomiting noted at this time. Patient does have some discomfort rated to be a 3-4/10 depending on activity specifically cleansing the wound makes it worse. KORTNEY, POTVIN ([id number]) 10/05/2017 -- the patient was seen by Dr. Lucky Cowboy last week and noninvasive studies showed a normal right ABI with brisk triphasic waveforms consistent with no arterial insufficiency including normal digital pressures. The duplex showed a patent distal right SFA stent and the proximal SFA was also normal. He was pleased with her test and thought she should have enough of perfusion for normal wound healing. He would see her back in 6 months time. Electronic Signature(s) Signed: 11/09/2017 1:02:03 PM By: Christin Fudge MD, FACS Entered By: Christin Fudge on 11/09/2017  13:02:03 [name] ([id number]) -------------------------------------------------------------------------------- Physical Exam Details Patient Name: [name] Date of Service: 11/09/2017 12:30 PM Medical Record Number: [id number] Patient Account Number: [id number] Date of Birth/Sex: 31-Mar-1923 (83 y.o. Female) Treating RN: Cornell Barman Primary Care Provider: FITZGERALD, DAVID Other Clinician: Referring Provider: FITZGERALD, DAVID Treating Provider/Extender: Frann Rider in Treatment: 13 Constitutional . Pulse  regular. Respirations normal and unlabored. Afebrile. . Eyes Nonicteric. Reactive to light. Ears, Nose, Mouth, and Throat Lips, teeth, and gums WNL.Marland Kitchen Moist mucosa without lesions. Neck supple and nontender. No palpable supraclavicular or cervical adenopathy. Normal sized without goiter. Respiratory WNL. No retractions.. Cardiovascular Pedal Pulses WNL. No clubbing, cyanosis or edema. Lymphatic No adneopathy. No adenopathy. No adenopathy. Musculoskeletal Adexa without tenderness or enlargement.. Digits and nails w/o clubbing, cyanosis, infection, petechiae, ischemia, or inflammatory conditions.. Integumentary (Hair, Skin) No suspicious lesions. No crepitus or fluctuance. No peri-wound warmth or erythema. No masses.Marland Kitchen Psychiatric Judgement and insight Intact.. No evidence of depression, anxiety, or agitation.. Notes the wound is looking excellent today with good resolution of the fungal infection surrounding the wound. Sharp debridement of the subcutaneous debris was done and minimal bleeding controlled with pressure Electronic Signature(s) Signed: 11/09/2017 1:02:49 PM By: Christin Fudge MD, FACS Entered By: Christin Fudge on 11/09/2017 13:02:48 [name] ([id number]) -------------------------------------------------------------------------------- Physician Orders Details Patient Name: [name] Date of Service: 11/09/2017 12:30  PM Medical Record Number: [id number] Patient Account Number: [id number] Date of Birth/Sex: 06/17/1923 (83 y.o. Female) Treating RN: Cornell Barman Primary Care Provider: FITZGERALD, DAVID Other Clinician: Referring Provider: FITZGERALD, DAVID Treating Provider/Extender: Frann Rider in Treatment: 35 Verbal / Phone Orders: No Diagnosis Coding Wound Cleansing Wound #1 Right,Lateral Malleolus o Clean wound with Normal Saline. o Cleanse wound with mild soap and water o May Shower, gently pat wound dry prior to applying new dressing. Anesthetic Wound #1 Right,Lateral Malleolus o Topical Lidocaine 4% cream applied to wound bed prior to debridement - for clinic use Skin Barriers/Peri-Wound Care Wound #1 Right,Lateral Malleolus o Skin Prep o Antifungal cream o Triamcinolone Acetonide Ointment Primary Wound Dressing Wound #1 Right,Lateral Malleolus o Silvercel Non-Adherent Secondary Dressing Wound #1 Right,Lateral Malleolus o Dry Gauze o Non-adherent pad - telfa island o Drawtex Dressing Change Frequency Wound #1 Right,Lateral Malleolus o Change dressing every day. Follow-up Appointments Wound #1 Right,Lateral Malleolus o Return Appointment in 1 week. Edema Control Wound #1 Right,Lateral Malleolus o Elevate legs to the level of the heart and pump ankles as often as possible o Other: - wear your compression stockings Off-Loading Wound #1 Right,Lateral Malleolus Kohen, [name] ([id number]) o Turn and reposition every 2 hours o Other: - do not put pressure on your right ankle Additional Orders / Instructions Wound #1 Right,Lateral Malleolus o Increase protein intake. o Other: - Add Vitamin C, Zinc, Vitamin A to your diet Electronic Signature(s) Signed: 11/09/2017 4:35:05 PM By: Christin Fudge MD, FACS Signed: 11/09/2017 5:11:28 PM By: Gretta Cool, BSN, RN, CWS, Kim RN, BSN Entered By: Gretta Cool, BSN, RN, CWS, Kim on 11/09/2017 12:54:47 Rinella,  [name] ([id number]) -------------------------------------------------------------------------------- Problem List Details Patient Name: AMEN, DARGIS Date of Service: 11/09/2017 12:30 PM Medical Record Number: [id number] Patient Account Number: [id number] Date of Birth/Sex: [birth year] (83 y.o. 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 Female) Treating RN: Cornell Barman Primary Care Provider: FITZGERALD, DAVID Other Clinician: Referring Provider: FITZGERALD, DAVID Treating Provider/Extender: Frann Rider in Treatment: 13 Subjective Chief Complaint Information obtained from Patient Patient presents to the wound care center for a consult due non healing wound to the right lateral malleolus History of Present Illness (HPI) The following HPI elements were documented for the patient's wound: Location: Patient presents with an ulcer on the right lateral malleolus Quality: Patient reports experiencing a dull pain to  affected area(s). Severity: Patient states wound are getting worse. Duration: Patient has had the wound for >2 years prior to seeking treatment at the wound center Timing: Pain in wound is constant (hurts all the time) Context: The wound would happen gradually Modifying Factors: Other treatment(s) tried include:seen a podiatrist on a vascular surgeons and under treatment with them Associated Signs and Symptoms: Patient reports having increase swelling. 83 year old patient who most recently has been seeing both podiatry and vascular surgery for a long-standing ulcer of her right lateral malleolus which has been treated with various methodologies. Dr. Amalia Hailey the podiatrist saw her on 07/20/2017 and sent her to the wound center for possible hyperbaric oxygen therapy. past medical history of peripheral vascular disease, varicose veins, status post appendectomy, basal cell carcinoma excision from the left leg, cholecystectomy, pacemaker placement, right lower extremity angiography done by Dr. dew in March 2017 with placement of a stent. there is also note of a successful ablation of the right small saphenous vein done which was reviewed by ultrasound on 10/24/2016. the patient had a right small saphenous vein ablation done on 10/20/2016. The patient has never been a smoker. She has been seen by Dr. Corene Cornea dew the vascular surgeon who most recently saw her on 06/15/2017 for evaluation of ongoing problems with right leg swelling. She had a lower extremity arterial duplex examination done(02/13/17) which showed patent distal right superficial femoral artery stent and above-the-knee popliteal stent without evidence of restenosis. The ABI was more than 1.3 on the right and more than 1.3 on the left. This was consistent with noncompressible arteries due to medial calcification. The right great toe pressure and PPG waveforms are within normal limits and the left great toe pressure and PPG waveforms are  decreased. he recommended she continue to wear her compression stockings and continue with elevation. She is scheduled to have a noninvasive arterial study in the near future 08/16/2017 -- had a lower extremity arterial duplex examination done which showed patent distal right superficial femoral artery stent and above-the-knee popliteal stent without evidence of restenosis. The ABI was more than 1.3 on the right and more than 1.3 on the left. This was consistent with noncompressible arteries due to medial calcification. The right great toe pressure and PPG waveforms are within normal limits and the left great toe pressure and PPG waveforms are decreased. the x-ray of the right ankle has not yet been done 08/24/2017 -- had a right ankle x-ray -- IMPRESSION:1. No fracture, bone lesion or evidence of osteomyelitis. 2. Lateral soft tissue swelling with a soft tissue ulcer. she has not yet seen the vascular surgeon for review 08/31/17 on evaluation today patient's wound appears to be showing signs of improvement. She still with her appointment with vascular in order to review her results of her vascular study and then determine if any intervention would be recommended at that time. No fevers, chills, nausea, or vomiting noted  at this time. She has been tolerating the dressing changes without ITHZEL, FEDORCHAK ([id number]) complication. 09/28/17 on evaluation today patient's wound appears to show signs of good improvement in regard to the granulation tissue which is surfacing. There is still a layer of slough covering the wound and the posterior portion is still significantly deeper than the anterior nonetheless there has been some good sign of things moving towards the better. She is going to go back to Dr. dew for reevaluation to ensure her blood flow is still appropriate. That will be before her next evaluation with [place] next week. No fevers, chills, nausea, or vomiting noted at this time. Patient  does have some discomfort rated to be a 3-4/10 depending on activity specifically cleansing the wound makes it worse. 10/05/2017 -- the patient was seen by Dr. Lucky Cowboy last week and noninvasive studies showed a normal right ABI with brisk triphasic waveforms consistent with no arterial insufficiency including normal digital pressures. The duplex showed a patent distal right SFA stent and the proximal SFA was also normal. He was pleased with her test and thought she should have enough of perfusion for normal wound healing. He would see her back in 6 months time. Patient History Information obtained from Patient. Family History Cancer - Father,Siblings, Heart Disease - Siblings, No family history of Diabetes, Hereditary Spherocytosis, Hypertension, Kidney Disease, Lung Disease, Seizures, Stroke, Thyroid Problems, Tuberculosis. Social History Never smoker, Marital Status - Married, Alcohol Use - Never, Drug Use - No History, Caffeine Use - Rarely. Objective Constitutional Pulse regular. Respirations normal and unlabored. Afebrile. Vitals Time Taken: 12:37 PM, Height: 65 in, Weight: 154.3 lbs, BMI: 25.7, Temperature: 97.6 F, Pulse: 86 bpm, Respiratory Rate: 16 breaths/min, Blood Pressure: 140/73 mmHg. Eyes Nonicteric. Reactive to light. Ears, Nose, Mouth, and Throat Lips, teeth, and gums WNL.Marland Kitchen Moist mucosa without lesions. Neck supple and nontender. No palpable supraclavicular or cervical adenopathy. Normal sized without goiter. Respiratory WNL. No retractions.. Cardiovascular Pedal Pulses WNL. No clubbing, cyanosis or edema. SHANNYN, JANKOWIAK ([id number]) Lymphatic No adneopathy. No adenopathy. No adenopathy. Musculoskeletal Adexa without tenderness or enlargement.. Digits and nails w/o clubbing, cyanosis, infection, petechiae, ischemia, or inflammatory conditions.Marland Kitchen Psychiatric Judgement and insight Intact.. No evidence of depression, anxiety, or agitation.. General Notes: the wound  is looking excellent today with good resolution of the fungal infection surrounding the wound. Sharp debridement of the subcutaneous debris was done and minimal bleeding controlled with pressure Integumentary (Hair, Skin) No suspicious lesions. No crepitus or fluctuance. No peri-wound warmth or erythema. No masses.. Wound #1 status is Open. Original cause of wound was Gradually Appeared. The wound is located on the Right,Lateral Malleolus. The wound measures 1cm length x 1.2cm width x 0.1cm depth; 0.942cm^2 area and 0.094cm^3 volume. There is Fat Layer (Subcutaneous Tissue) Exposed exposed. There is no tunneling or undermining noted. There is a large amount of serous drainage noted. The wound margin is distinct with the outline attached to the wound base. There is medium (34-66%) red granulation within the wound bed. There is a medium (34-66%) amount of necrotic tissue within the wound bed including Adherent Slough. The periwound skin appearance exhibited: Maceration, Erythema. The surrounding wound skin color is noted with erythema which is circumferential. Periwound temperature was noted as No Abnormality. The periwound has tenderness on palpation. Assessment Active Problems ICD-10 L97.312 - 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d I70.233 - Atherosclerosis of native arteries of right leg with ulceration of ankle I89.0 - Lymphedema, not elsewhere classified  B35.4 - Tinea corporis Procedures Wound #1 Pre-procedure diagnosis of Wound #1 is a Lymphedema located on the Right,Lateral Malleolus .Severity of Tissue Pre Debridement is: Fat layer exposed. There was a Skin/Subcutaneous Tissue Debridement ([id number]) debridement with total area of 1.2 sq cm performed by Christin Fudge, MD. with the following instrument(s): Curette to remove Viable and Non- Viable tissue/material including Subcutaneous after achieving pain control using Other (lidocaine 4%). A time out  was conducted at 12:50, prior to the start of the procedure. A Minimum amount of bleeding was controlled with Pressure. The procedure was tolerated well with a pain level of 0 throughout and a pain level of 0 following the procedure. Post Debridement Measurements: 1cm length x 1.2cm width x 0.1cm depth; 0.094cm^3 volume. Character of Wound/Ulcer Post Debridement requires further debridement. Severity of Tissue Post Debridement is: Fat layer exposed. Post procedure Diagnosis Wound #1: Same as Pre-Procedure Zaccaro, [name] ([id number]) General Notes: the fungal infection around the main wound is looking much better. Plan Wound Cleansing: Wound #1 Right,Lateral Malleolus: Clean wound with Normal Saline. Cleanse wound with mild soap and water May Shower, gently pat wound dry prior to applying new dressing. Anesthetic: Wound #1 Right,Lateral Malleolus: Topical Lidocaine 4% cream applied to wound bed prior to debridement - for clinic use Skin Barriers/Peri-Wound Care: Wound #1 Right,Lateral Malleolus: Skin Prep Antifungal cream Triamcinolone Acetonide Ointment Primary Wound Dressing: Wound #1 Right,Lateral Malleolus: Silvercel Non-Adherent Secondary Dressing: Wound #1 Right,Lateral Malleolus: Dry Gauze Non-adherent pad - telfa island Drawtex Dressing Change Frequency: Wound #1 Right,Lateral Malleolus: Change dressing every day. Follow-up Appointments: Wound #1 Right,Lateral Malleolus: Return Appointment in 1 week. Edema Control: Wound #1 Right,Lateral Malleolus: Elevate legs to the level of the heart and pump ankles as often as possible Other: - wear your compression stockings Off-Loading: Wound #1 Right,Lateral Malleolus: Turn and reposition every 2 hours Other: - do not put pressure on your right ankle Additional Orders / Instructions: Wound #1 Right,Lateral Malleolus: Increase protein intake. Other: - Add Vitamin C, Zinc, Vitamin A to your diet After sharp debridement  and a review, I have recommended: 1. Silver alginate with drawtex locally to be applied with a bordered foam after washing with soap and water 2. liberal use of the antifungal to the surrounding skin -- econazole prescribed last week 3. Continue to wear her compression stockings as before SHELISHA, GAUTIER. ([id number]) 4. elevation and exercise discussed with her in great detail 5. Regular visits to the wound center Electronic Signature(s) Signed: 11/09/2017 1:06:34 PM By: Christin Fudge MD, FACS Entered By: Christin Fudge on 11/09/2017 13:06:34 [name] ([id number]) -------------------------------------------------------------------------------- ROS/PFSH Details Patient Name: [name] Date of Service: 11/09/2017 12:30 PM Medical Record Number: [id number] Patient Account Number: [id number] Date of Birth/Sex: 1923/03/17 (83 y.o. Female) Treating RN: Cornell Barman Primary Care Provider: FITZGERALD, DAVID Other Clinician: Referring Provider: FITZGERALD, DAVID Treating Provider/Extender: Frann Rider in Treatment: 13 Information Obtained From Patient Wound History Do you currently have one or more open woundso Yes How many open wounds do you currently haveo 1 Approximately how long have you had your woundso 2 yrs How have you been treating your wound(s) until nowo mupirocin, soaking in epsom salt Has your wound(s) ever healed and then re-openedo No Have you had any lab work done in the past montho No Have you tested positive for an antibiotic resistant organism (MRSA, VRE)o No Have you tested positive for osteomyelitis (bone infection)o No Have you had any tests for circulation on your legso Yes  Who ordered the testo Dr. Lucky Cowboy [facility] the test [facility]o [facility] Medical History: Positive for: Cataracts - surgery Cardiovascular Medical History: Positive for: Congestive Heart Failure; Hypertension Musculoskeletal Medical History: Positive for:  Osteoarthritis Neurologic Medical History: Positive for: Neuropathy Oncologic Medical History: Negative for: Received Chemotherapy; Received Radiation HBO Extended History Items Eyes: Cataracts Immunizations Pneumococcal Vaccine: Received Pneumococcal Vaccination: Yes FATIMATA, TALSMA ([id number]) Implantable Devices Family and Social History Cancer: Yes - Father,Siblings; Diabetes: No; Heart Disease: Yes - Siblings; Hereditary Spherocytosis: No; Hypertension: No; Kidney Disease: No; Lung Disease: No; Seizures: No; Stroke: No; Thyroid Problems: No; Tuberculosis: No; Never smoker; Marital Status - Married; Alcohol Use: Never; Drug Use: No History; Caffeine Use: Rarely; Financial Concerns: No; Food, Clothing or Shelter Needs: No; Support System Lacking: No; Transportation Concerns: No; Advanced Directives: No; Patient does not want information on Advanced Directives; Do not resuscitate: No; Living Will: Yes (Not Provided); Medical Power of Attorney: No Physician Affirmation I have reviewed and agree with the above information. Electronic Signature(s) Signed: 11/09/2017 4:35:05 PM By: Christin Fudge MD, FACS Signed: 11/09/2017 5:11:28 PM By: Gretta Cool BSN, RN, CWS, Kim RN, BSN Entered By: Christin Fudge on 11/09/2017 13:02:11 Landree, [place] [name] ([id number]) -------------------------------------------------------------------------------- SuperBill Details Patient Name: TEAL, RABEN Date of Service: 11/09/2017 Medical Record Number: [id number] Patient Account Number: [id number] Date of Birth/Sex: 1923-07-31 (83 y.o. 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 I70.233 Atherosclerosis of native arteries of right leg with ulceration B35.4 Tinea corporis I89.0  Lymphedema, not elsewhere classified Modifier: d of ankle Quantity: 1 Physician Procedures CPT4 Code: 3354562 Description: 56389 - WC PHYS SUBQ TISS 20 SQ CM ICD-10 Diagnosis Description H73.428 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

## 2017-11-16 ENCOUNTER — Encounter: Payer: Medicare Other | Admitting: Surgery

## 2017-11-16 DIAGNOSIS — I70233 Atherosclerosis of native arteries of right leg with ulceration of ankle: Secondary | ICD-10-CM | POA: Diagnosis not present

## 2017-11-16 NOTE — Progress Notes (Signed)
[name] ([id number]) Visit Report for 11/09/2017 Arrival Information Details Patient Name: [name], [name] Date of Service: 11/09/2017 12:30 PM Medical Record Number: [id number] Patient Account Number: [id number] Date of Birth/Sex: 12-20-1922 (83 y.o. Female) Treating RN: Cornell Barman Primary Care Kamillah Didonato: FITZGERALD, DAVID Other Clinician: Referring Lennox Dolberry: FITZGERALD, DAVID Treating Thi Sisemore/Extender: Frann Rider in Treatment: 13 Visit Information History Since Last Visit Added or deleted any medications: No Patient Arrived: Ambulatory Any new allergies or adverse reactions: No Arrival Time: 12:34 Had a fall or experienced change in No Accompanied By: daughter activities of daily living that may affect Transfer Assistance: None risk of falls: Patient Identification Verified: Yes Signs or symptoms of abuse/neglect since last visito No Secondary Verification Process Completed: Yes Hospitalized since last visit: No Patient Requires Transmission-Based No Has Dressing in Place as Prescribed: Yes Precautions: Pain Present Now: No Patient Has Alerts: No Electronic Signature(s) Signed: 11/09/2017 5:11:28 PM By: Gretta Cool, BSN, RN, CWS, Kim RN, BSN Entered By: Gretta Cool, BSN, RN, CWS, Kim on 11/09/2017 12:37:02 Golladay, [name] ([id number]) -------------------------------------------------------------------------------- Encounter Discharge Information Details Patient Name: [name] Date of Service: 11/09/2017 12:30 PM Medical Record Number: [id number] Patient Account Number: [id number] Date of Birth/Sex: [birth year] (83 y.o. Female) Treating RN: Cornell Barman Primary Care Shaney Deckman: FITZGERALD, DAVID Other Clinician: Referring Shadrick Senne: FITZGERALD, DAVID Treating Zahriyah Joo/Extender: Frann Rider in Treatment: 13 Encounter Discharge Information Items Discharge Pain Level: 0 Discharge Condition: Stable Ambulatory Status: Ambulatory Discharge Destination:  Home Transportation: Ambulance Accompanied By: daughter Schedule Follow-up Appointment: Yes Medication Reconciliation completed and No provided to Patient/Care Chasady Longwell: Provided on Clinical Summary of Care: 11/09/2017 Form Type Recipient Paper Patient WS Electronic Signature(s) Signed: 11/14/2017 9:14:59 AM By: Ruthine Dose Entered By: Ruthine Dose on 11/09/2017 13:09:56 Schomer, [name] ([id number]) -------------------------------------------------------------------------------- Lower Extremity Assessment Details Patient Name: [name] Date of Service: 11/09/2017 12:30 PM Medical Record Number: [id number] Patient Account Number: [id number] Date of Birth/Sex: March 22, 1923 (83 y.o. Female) Treating RN: Cornell Barman Primary Care Donae Kueker: FITZGERALD, DAVID Other Clinician: Referring Sabrina Keough: FITZGERALD, DAVID Treating Janiah Devinney/Extender: Frann Rider in Treatment: 13 Vascular Assessment Pulses: Dorsalis Pedis Palpable: [Right:Yes] Posterior Tibial Extremity colors, hair growth, and conditions: Extremity Color: [Right:Normal] Hair Growth on Extremity: [Right:Yes] Temperature of Extremity: [Right:Warm] Capillary Refill: [Right:< 3 seconds] Toe Nail Assessment Left: Right: Thick: Yes Discolored: Yes Deformed: Yes Improper Length and Hygiene: Yes Electronic Signature(s) Signed: 11/09/2017 5:11:28 PM By: Gretta Cool, BSN, RN, CWS, Kim RN, BSN Entered By: Gretta Cool, BSN, RN, CWS, Kim on 11/09/2017 12:42:42 Outman, [name] ([id number]) -------------------------------------------------------------------------------- Multi Wound Chart Details Patient Name: [name] Date of Service: 11/09/2017 12:30 PM Medical Record Number: [id number] Patient Account Number: [id number] Date of Birth/Sex: 09/21/1923 (83 y.o. Female) Treating RN: Cornell Barman Primary Care Nazarene Bunning: FITZGERALD, DAVID Other Clinician: Referring Davena Julian: FITZGERALD, DAVID Treating  Hyun Marsalis/Extender: Frann Rider in Treatment: 13 Vital Signs Height(in): 65 Pulse(bpm): 86 Weight(lbs): 154.3 Blood Pressure(mmHg): 140/73 Body Mass Index(BMI): 26 Temperature(F): 97.6 Respiratory Rate 16 (breaths/min): Photos: [N/A:N/A] Wound Location: Right Malleolus - Lateral N/A N/A Wounding Event: Gradually Appeared N/A N/A Primary Etiology: Lymphedema N/A N/A Secondary Etiology: Arterial Insufficiency Ulcer N/A N/A Comorbid History: Cataracts, Congestive Heart N/A N/A Failure, Hypertension, Osteoarthritis, Neuropathy Date Acquired: 08/11/2015 N/A N/A Weeks of Treatment: 13 N/A N/A Wound Status: Open N/A N/A Measurements L x W x D 1x1.2x0.1 N/A N/A (cm) Area (cm) : 0.942 N/A N/A Volume (cm) : 0.094 N/A N/A % Reduction in Area: 38.50% N/A N/A % Reduction in Volume: 79.50% N/A N/A  Classification: Full Thickness Without N/A N/A Exposed Support Structures Exudate Amount: Large N/A N/A Exudate Type: Serous N/A N/A Exudate Color: amber N/A N/A Wound Margin: Distinct, outline attached N/A N/A Granulation Amount: Medium (34-66%) N/A N/A Granulation Quality: Red N/A N/A Necrotic Amount: Medium (34-66%) N/A N/A Exposed Structures: Fat Layer (Subcutaneous N/A N/A Tissue) Exposed: Yes Epithelialization: Small (1-33%) N/A N/A Debridement: Debridement ([id number]) N/A N/A Pre-procedure 12:50 N/A N/A Verification/Time Out Taken: [name] ([id number]) Pain Control: Other N/A N/A Tissue Debrided: Subcutaneous N/A N/A Level: Skin/Subcutaneous Tissue N/A N/A Debridement Area (sq cm): 1.2 N/A N/A Instrument: Curette N/A N/A Bleeding: Minimum N/A N/A Hemostasis Achieved: Pressure N/A N/A Procedural Pain: 0 N/A N/A Post Procedural Pain: 0 N/A N/A Debridement Treatment Procedure was tolerated well N/A N/A Response: Post Debridement 1x1.2x0.1 N/A N/A Measurements L x W x D (cm) Post Debridement Volume: 0.094 N/A N/A (cm) Periwound Skin Texture: No  Abnormalities Noted N/A N/A Periwound Skin Moisture: Maceration: Yes N/A N/A Periwound Skin Color: Erythema: Yes N/A N/A Erythema Location: Circumferential N/A N/A Temperature: No Abnormality N/A N/A Tenderness on Palpation: Yes N/A N/A Wound Preparation: Ulcer Cleansing: N/A N/A Rinsed/Irrigated with Saline Topical Anesthetic Applied: Other: lidocaine 4% Procedures Performed: Debridement N/A N/A Treatment Notes Wound #1 (Right, Lateral Malleolus) 1. Cleansed with: Clean wound with Normal Saline 2. Anesthetic Topical Lidocaine 4% cream to wound bed prior to debridement 4. Dressing Applied: Other dressing (specify in notes) 5. Secondary [place] Notes silvercel, TCA, drawtex Electronic Signature(s) Signed: 11/09/2017 1:01:36 PM By: Christin Fudge MD, FACS Entered By: Christin Fudge on 11/09/2017 13:01:35 Rhein, [name] ([id number]) -------------------------------------------------------------------------------- [place] Details Patient Name: [name] Date of Service: 11/09/2017 12:30 PM Medical Record Number: [id number] Patient Account Number: [id number] Date of Birth/Sex: [birth year] (83 y.o. Female) Treating RN: Cornell Barman Primary Care Nephtali Docken: FITZGERALD, DAVID Other Clinician: Referring Ramzy Cappelletti: FITZGERALD, DAVID Treating Shelie Lansing/Extender: Frann Rider in Treatment: 13 Active Inactive ` Abuse / Safety / Falls / Self Care Management Nursing Diagnoses: Potential for falls Goals: Patient will not experience any injury related to falls Date Initiated: 08/10/2017 Target Resolution Date: 11/10/2017 Goal Status: Active Interventions: Assess Activities of Daily Living upon admission and as needed Assess fall risk on admission and as needed Assess: immobility, friction, shearing, incontinence upon admission and as needed Notes: ` Nutrition Nursing Diagnoses: Imbalanced nutrition Potential for alteratiion in  Nutrition/Potential for imbalanced nutrition Goals: Patient/caregiver agrees to and verbalizes understanding of need to use nutritional supplements and/or vitamins as prescribed Date Initiated: 08/10/2017 Target Resolution Date: 12/08/2017 Goal Status: Active Interventions: Assess patient nutrition upon admission and as needed per policy Notes: ` Orientation to the Wound Care Program Nursing Diagnoses: Knowledge deficit related to the wound healing center program Goals: Patient/caregiver will verbalize understanding of the [facility] Date Initiated: 08/10/2017 Target Resolution Date: 09/08/2017 CASSIA, FEIN ([id number]) Goal Status: Active Interventions: Provide education on orientation to the wound center Notes: ` Pain, Acute or Chronic Nursing Diagnoses: Pain, acute or chronic: actual or potential Potential alteration in comfort, pain Goals: Patient/caregiver will verbalize adequate pain control between visits Date Initiated: 08/10/2017 Target Resolution Date: 12/08/2017 Goal Status: Active Interventions: Complete pain assessment as per visit requirements Notes: ` Wound/Skin Impairment Nursing Diagnoses: Impaired tissue integrity Knowledge deficit related to ulceration/compromised skin integrity Goals: Ulcer/skin breakdown will have a volume reduction of 80% by week 12 Date Initiated: 08/10/2017 Target Resolution Date: 12/01/2017 Goal Status: Active Interventions: Assess patient/caregiver ability to perform ulcer/skin care  regimen upon admission and as needed Notes: Electronic Signature(s) Signed: 11/09/2017 5:11:28 PM By: Gretta Cool, BSN, RN, CWS, Kim RN, BSN Entered By: Gretta Cool, BSN, RN, CWS, Kim on 11/09/2017 12:43:41 [place]bon, [name] ([id number]) -------------------------------------------------------------------------------- Pain Assessment Details Patient Name: [name] Date of Service: 11/09/2017 12:30 PM Medical Record Number:  [id number] Patient Account Number: [id number] Date of Birth/Sex: 03/15/1923 (83 y.o. Female) Treating RN: Cornell Barman Primary Care Josuha Fontanez: FITZGERALD, DAVID Other Clinician: Referring Nara Paternoster: FITZGERALD, DAVID Treating Rory Montel/Extender: Frann Rider in Treatment: 13 Active Problems Location of Pain Severity and Description of Pain Patient Has Paino No Site Locations With Dressing Change: No Pain Management and Medication Current Pain Management: Goals for Pain Management Topical or injectable lidocaine is offered to patient for acute pain when surgical debridement is performed. If needed, Patient is instructed to use over the counter pain medication for the following 24-48 hours after debridement. Wound care MDs do not prescribed pain medications. Patient has chronic pain or uncontrolled pain. Patient has been instructed to make an appointment with their Primary Care Physician for pain management. Electronic Signature(s) Signed: 11/09/2017 5:11:28 PM By: Gretta Cool, BSN, RN, CWS, Kim RN, BSN Entered By: Gretta Cool, BSN, RN, CWS, Kim on 11/09/2017 12:37:29 Joni Fears [name] ([id number]) -------------------------------------------------------------------------------- Patient/Caregiver Education Details Patient Name: [name], [name] Date of Service: 11/09/2017 12:30 PM Medical Record Number: [id number] Patient Account Number: [id number] Date of Birth/Gender: [birth year] (83 y.o. Female) Treating RN: Cornell Barman Primary Care Physician: FITZGERALD, DAVID Other Clinician: Referring Physician: FITZGERALD, DAVID Treating Physician/Extender: Frann Rider in Treatment: 13 Education Assessment Education Provided To: Patient Education Topics Provided Wound/Skin Impairment: Handouts: Caring for Your Ulcer, Other: continue woudn care as prescribed Methods: Demonstration, Explain/Verbal Responses: State content correctly Electronic Signature(s) Signed: 11/09/2017 5:11:28 PM By:  Gretta Cool, BSN, RN, CWS, Kim RN, BSN Entered By: Gretta Cool, BSN, RN, CWS, Kim on 11/09/2017 12:57:59 [name], [name] ([id number]) -------------------------------------------------------------------------------- Wound Assessment Details Patient Name: [name] Date of Service: 11/09/2017 12:30 PM Medical Record Number: [id number] Patient Account Number: [id number] Date of Birth/Sex: 1923/01/29 (83 y.o. Female) Treating RN: Cornell Barman Primary Care Shaylie Eklund: FITZGERALD, DAVID Other Clinician: Referring Daci Stubbe: FITZGERALD, DAVID Treating Harleen Fineberg/Extender: Frann Rider in Treatment: 13 Wound Status Wound Number: 1 Primary Lymphedema Etiology: Wound Location: Right Malleolus - Lateral Secondary Arterial Insufficiency Ulcer Wounding Event: Gradually Appeared Etiology: Date Acquired: 08/11/2015 Wound Status: Open Weeks Of Treatment: 13 Comorbid Cataracts, Congestive Heart Failure, Clustered Wound: No History: Hypertension, Osteoarthritis, Neuropathy Photos Wound Measurements Length: (cm) 1 Width: (cm) 1.2 Depth: (cm) 0.1 Area: (cm) 0.942 Volume: (cm) 0.094 % Reduction in Area: 38.5% % Reduction in Volume: 79.5% Epithelialization: Small (1-33%) Tunneling: No Undermining: No Wound Description Full Thickness Without Exposed Support Foul Od Classification: Structures Slough/ Wound Margin: Distinct, outline attached Exudate Large Amount: Exudate Type: Serous Exudate Color: amber or After Cleansing: No Fibrino Yes Wound Bed Granulation Amount: Medium (34-66%) Exposed Structure Granulation Quality: Red Fat Layer (Subcutaneous Tissue) Exposed: Yes Necrotic Amount: Medium (34-66%) Necrotic Quality: Adherent Slough Periwound Skin Texture Texture Color No Abnormalities Noted: No No Abnormalities Noted: No Erythema: Yes Moisture Erythema Location: Circumferential No Abnormalities Noted: No Beery, [name] ([id number]) Maceration: Yes Temperature /  Pain Temperature: No Abnormality Tenderness on Palpation: Yes Wound Preparation Ulcer Cleansing: Rinsed/Irrigated with Saline Topical Anesthetic Applied: Other: lidocaine 4%, Treatment Notes Wound #1 (Right, Lateral Malleolus) 1. Cleansed with: Clean wound with Normal Saline 2. Anesthetic Topical Lidocaine 4% cream to wound bed prior to debridement 4. Dressing Applied: Other dressing (  specify in notes) 5. Secondary [place] Notes silvercel, TCA, drawtex Electronic Signature(s) Signed: 11/09/2017 5:11:28 PM By: Gretta Cool, BSN, RN, CWS, Kim RN, BSN Entered By: Gretta Cool, BSN, RN, CWS, Kim on 11/09/2017 12:41:14 Joni Fears, [name] ([id number]) -------------------------------------------------------------------------------- [place] Details Patient Name: [name] Date of Service: 11/09/2017 12:30 PM Medical Record Number: [id number] Patient Account Number: [id number] Date of Birth/Sex: [birth year] (83 y.o. Female) Treating RN: Cornell Barman Primary Care Eiley Mcginnity: FITZGERALD, DAVID Other Clinician: Referring Raha Tennison: FITZGERALD, DAVID Treating Dezaria Methot/Extender: Frann Rider in Treatment: 13 Vital Signs Time Taken: 12:37 Temperature (F): 97.6 Height (in): 65 Pulse (bpm): 86 Weight (lbs): 154.3 Respiratory Rate (breaths/min): 16 Body Mass Index (BMI): 25.7 Blood Pressure (mmHg): 140/73 Reference Range: 80 - 120 mg / dl Electronic Signature(s) Signed: 11/09/2017 5:11:28 PM By: Gretta Cool, BSN, RN, CWS, Kim RN, BSN Entered By: Gretta Cool, BSN, RN, CWS, Kim on 11/09/2017 12:38:08

## 2017-11-18 NOTE — Progress Notes (Signed)
[name], [name] ([id number]) Visit Report for 11/16/2017 Chief Complaint Document Details Patient Name: [name], [name]. Date of Service: 11/16/2017 12:30 PM Medical Record Number: [id number] Patient Account Number: [id number] Date of Birth/Sex: [birth year] (83 y.o. Female) Treating RN: Ahmed Prima Primary Care Provider: FITZGERALD, DAVID Other Clinician: Referring Provider: FITZGERALD, DAVID Treating Provider/Extender: Frann Rider in Treatment: 14 Information Obtained from: Patient Chief Complaint Patient presents to the wound care center for a consult due non healing wound to the right lateral malleolus Electronic Signature(s) Signed: 11/16/2017 1:07:28 PM By: Christin Fudge MD, FACS Entered By: Christin Fudge on 11/16/2017 13:07:28 [name] ([id number]) -------------------------------------------------------------------------------- HPI Details Patient Name: [name] Date of Service: 11/16/2017 12:30 PM Medical Record Number: [id number] Patient Account Number: [id number] Date of Birth/Sex: December 03, 1923 (83 y.o. Female) Treating RN: Ahmed Prima Primary Care Provider: FITZGERALD, DAVID Other Clinician: Referring Provider: FITZGERALD, DAVID Treating Provider/Extender: Frann Rider in Treatment: 14 History of Present Illness Location: Patient presents with an ulcer on the right lateral malleolus Quality: Patient reports experiencing a dull pain to affected area(s). Severity: Patient states wound are getting worse. Duration: Patient has had the wound for >2 years prior to seeking treatment at the wound center Timing: Pain in wound is constant (hurts all the time) Context: The wound would happen gradually Modifying Factors: Other treatment(s) tried include:seen a podiatrist on a vascular surgeons and under treatment with them Associated Signs and Symptoms: Patient reports having increase swelling. 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 Dr. Amalia Hailey the podiatrist saw her on 07/20/2017 and sent her to the wound center for possible hyperbaric oxygen therapy. past medical history of peripheral vascular disease, varicose veins, status post appendectomy, basal cell carcinoma excision from the left leg, cholecystectomy, pacemaker placement, right lower extremity angiography done by Dr. dew in March 2017 with placement of a stent. there is also note of a successful ablation of the right small saphenous vein done which was reviewed by ultrasound on 10/24/2016. the patient had a right small saphenous vein ablation done on 10/20/2016. The patient has never been a smoker. She has been seen by Dr. Corene Cornea dew the vascular surgeon who most recently saw her on 06/15/2017 for evaluation of ongoing problems with right leg swelling. She had a lower extremity arterial duplex examination done(02/13/17) which showed patent distal right superficial femoral artery stent and above-the-knee popliteal stent without evidence of restenosis. The ABI was more than 1.3 on the right and more than 1.3 on the left. This was consistent with noncompressible arteries due to medial calcification. The right great toe pressure and PPG waveforms are within normal limits and the left great toe pressure and PPG waveforms are decreased. he recommended she continue to wear her compression stockings and continue with elevation. She is scheduled to have a noninvasive arterial study in the near future 08/16/2017 -- had a lower extremity arterial duplex examination done which showed patent distal right superficial femoral artery stent and above-the-knee popliteal stent without evidence of restenosis. The ABI was more than 1.3 on the right and more than 1.3 on the left. This was consistent with noncompressible  arteries due to medial calcification. The right great toe pressure and PPG waveforms are within normal limits and the left great toe pressure and PPG waveforms are decreased. the x-ray of the right ankle has not yet been done  08/24/2017 -- had a right ankle x-ray -- IMPRESSION:1. No fracture, bone lesion or evidence of osteomyelitis. 2. Lateral soft tissue swelling with a soft tissue ulcer. she has not yet seen the vascular surgeon for review 08/31/17 on evaluation today patient's wound appears to be showing signs of improvement. She still with her appointment with vascular in order to review her results of her vascular study and then determine if any intervention would be recommended at that time. No fevers, chills, nausea, or vomiting noted at this time. She has been tolerating the dressing changes without complication. 09/28/17 on evaluation today patient's wound appears to show signs of good improvement in regard to the granulation tissue which is surfacing. There is still a layer of slough covering the wound and the posterior portion is still significantly deeper than the anterior nonetheless there has been some good sign of things moving towards the better. She is going to go back to Dr. dew for reevaluation to ensure her blood flow is still appropriate. That will be before her next evaluation with [place] next week. No fevers, chills, nausea, or vomiting noted at this time. Patient does have some discomfort rated to be a 3-4/10 depending on activity specifically cleansing the wound makes it worse. [name], [name] ([id number]) 10/05/2017 -- the patient was seen by Dr. Lucky Cowboy last week and noninvasive studies showed a normal right ABI with brisk triphasic waveforms consistent with no arterial insufficiency including normal digital pressures. The duplex showed a patent distal right SFA stent and the proximal SFA was also normal. He was pleased with her test and thought she should have enough of  perfusion for normal wound healing. He would see her back in 6 months time. Electronic Signature(s) Signed: 11/16/2017 1:07:35 PM By: Christin Fudge MD, FACS Entered By: Christin Fudge on 11/16/2017 13:07:35 [name], [name] ([id number]) -------------------------------------------------------------------------------- Physical Exam Details Patient Name: [name] Date of Service: 11/16/2017 12:30 PM Medical Record Number: [id number] Patient Account Number: [id number] Date of Birth/Sex: 1923/11/22 (83 y.o. Female) Treating RN: Ahmed Prima Primary Care Provider: FITZGERALD, DAVID Other Clinician: Referring Provider: FITZGERALD, DAVID Treating Provider/Extender: Frann Rider in Treatment: 14 Constitutional . Pulse regular. Respirations normal and unlabored. Afebrile. . Eyes Nonicteric. Reactive to light. Ears, Nose, Mouth, and Throat Lips, teeth, and gums WNL.Marland Kitchen Moist mucosa without lesions. Neck supple and nontender. No palpable supraclavicular or cervical adenopathy. Normal sized without goiter. Respiratory WNL. No retractions.. Cardiovascular Pedal Pulses WNL. No clubbing, cyanosis or edema. Lymphatic No adneopathy. No adenopathy. No adenopathy. Musculoskeletal Adexa without tenderness or enlargement.. Digits and nails w/o clubbing, cyanosis, infection, petechiae, ischemia, or inflammatory conditions.. Integumentary (Hair, Skin) No suspicious lesions. No crepitus or fluctuance. No peri-wound warmth or erythema. No masses.Marland Kitchen Psychiatric Judgement and insight Intact.. No evidence of depression, anxiety, or agitation.. Notes the main wound is looking good and the surrounding area has less fungal infection but I believe she has a reaction to the bordered foam and this is causing a bit of redness over the area where there is tape. There is no evidence of cellulitis Electronic Signature(s) Signed: 11/16/2017 1:09:00 PM By: Christin Fudge MD, FACS Entered By: Christin Fudge on 11/16/2017 13:08:59 [name] ([id number]) -------------------------------------------------------------------------------- Physician Orders Details Patient Name: [name] Date of Service: 11/16/2017 12:30 PM Medical Record Number: [id number] Patient Account Number: [id number] Date of Birth/Sex: 1923-11-06 (83 y.o. Female) Treating RN: Ahmed Prima Primary Care Provider: FITZGERALD, DAVID Other Clinician: Referring Provider: FITZGERALD, DAVID Treating Provider/Extender: Frann Rider in  Treatment: 14 Verbal / Phone Orders: Yes Clinician: Pinkerton, Debi Read Back and Verified: Yes Diagnosis Coding Wound Cleansing Wound #1 Right,Lateral Malleolus o Clean wound with Normal Saline. o Cleanse wound with mild soap and water o May Shower, gently pat wound dry prior to applying new dressing. Skin Barriers/Peri-Wound Care Wound #1 Right,Lateral Malleolus o Skin Prep o Antifungal cream o Triamcinolone Acetonide Ointment Primary Wound Dressing Wound #1 Right,Lateral Malleolus o Silvercel Non-Adherent Secondary Dressing Wound #1 Right,Lateral Malleolus o Dry Gauze o Conform/Kerlix Dressing Change Frequency Wound #1 Right,Lateral Malleolus o Change dressing every other day. Follow-up Appointments o Return Appointment in 1 week. Edema Control Wound #1 Right,Lateral Malleolus o Elevate legs to the level of the heart and pump ankles as often as possible o Other: - wear your compression stockings order juxtalite Off-Loading Wound #1 Right,Lateral Malleolus o Turn and reposition every 2 hours o Other: - do not put pressure on your right ankle Additional Orders / Instructions Wound #1 Right,Lateral Malleolus o Increase protein intake. [name], [name] ([id number]) o Other: - Add Vitamin C, Zinc, Vitamin A to your diet Patient Medications Allergies: sulfa, metronidazole, monistat Notifications Medication  Indication Start End lidocaine DOSE 1 - topical 4 % cream - 1 cream topical Electronic Signature(s) Signed: 11/16/2017 4:49:12 PM By: Christin Fudge MD, FACS Signed: 11/16/2017 4:53:17 PM By: Alric Quan Entered By: Alric Quan on 11/16/2017 13:02:19 [name], [name] ([id number]) -------------------------------------------------------------------------------- Prescription 11/16/2017 Patient Name: [name]. Provider: Christin Fudge MD Date of Birth: 1923-05-28 NPI#: [id number] Sex: F DEA#: [id number] Phone #: [phone] License #: Patient Address: [place], [place] [address] [address], [place], [place] [address] [phone] Allergies sulfa metronidazole monistat Medication Medication: Route: Strength: Form: lidocaine 4 % topical cream topical 4% cream Class: TOPICAL LOCAL ANESTHETICS Dose: Frequency / Time: Indication: 1 1 cream topical Number of Refills: Number of Units: 0 Generic Substitution: Start Date: End Date: One Time Use: Substitution Permitted No Note to Pharmacy: Signature(s): Date(s): Electronic Signature(s) Signed: 11/16/2017 4:49:12 PM By: Christin Fudge MD, FACS Signed: 11/16/2017 4:53:17 PM By: Julious Payer ([id number]) Entered By: Alric Quan on 11/16/2017 13:02:20 [name], [name] ([id number]) --------------------------------------------------------------------------------  Problem List Details Patient Name: [name] Date of Service: 11/16/2017 12:30 PM Medical Record Number: [id number] Patient Account Number: [id number] Date of Birth/Sex: 1923/01/12 (83 y.o. Female) Treating RN: Ahmed Prima Primary Care Provider: FITZGERALD, DAVID Other Clinician: Referring Provider: FITZGERALD, DAVID Treating Provider/Extender: Frann Rider in Treatment: 14 Active  Problems ICD-10 Encounter Code Description Active Date Diagnosis L97.312 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d 08/10/2017 Yes I70.233 Atherosclerosis of native arteries of right leg with ulceration of ankle 08/10/2017 Yes I89.0 Lymphedema, not elsewhere classified 08/10/2017 Yes B35.4 Tinea corporis 09/28/2017 Yes Inactive Problems Resolved Problems Electronic Signature(s) Signed: 11/16/2017 1:07:15 PM By: Christin Fudge MD, FACS Entered By: Christin Fudge on 11/16/2017 13:07:15 [name], [name] ([id number]) -------------------------------------------------------------------------------- Progress Note Details Patient Name: [name] Date of Service: 11/16/2017 12:30 PM Medical Record Number: [id number] Patient Account Number: [id number] Date of Birth/Sex: 03/13/1923 (83 y.o. Female) Treating RN: Ahmed Prima Primary Care Provider: FITZGERALD, DAVID Other Clinician: Referring Provider: FITZGERALD, DAVID Treating Provider/Extender: Frann Rider in Treatment: 14 Subjective Chief Complaint Information obtained from Patient Patient presents to the wound care center for a consult due non healing wound to the right lateral malleolus History of Present Illness (HPI) The following  HPI elements were documented for the patient's wound: Location: Patient presents with an ulcer on the right lateral malleolus Quality: Patient reports experiencing a dull pain to affected area(s). Severity: Patient states wound are getting worse. Duration: Patient has had the wound for >2 years prior to seeking treatment at the wound center Timing: Pain in wound is constant (hurts all the time) Context: The wound would happen gradually Modifying Factors: Other treatment(s) tried include:seen a podiatrist on a vascular surgeons and under treatment with them Associated Signs and Symptoms: Patient reports having increase swelling. 83 year old patient who most recently has  been seeing both podiatry and vascular surgery for a long-standing ulcer of her right lateral malleolus which has been treated with various methodologies. Dr. Amalia Hailey the podiatrist saw her on 07/20/2017 and sent her to the wound center for possible hyperbaric oxygen therapy. past medical history of peripheral vascular disease, varicose veins, status post appendectomy, basal cell carcinoma excision from the left leg, cholecystectomy, pacemaker placement, right lower extremity angiography done by Dr. dew in March 2017 with placement of a stent. there is also note of a successful ablation of the right small saphenous vein done which was reviewed by ultrasound on 10/24/2016. the patient had a right small saphenous vein ablation done on 10/20/2016. The patient has never been a smoker. She has been seen by Dr. Corene Cornea dew the vascular surgeon who most recently saw her on 06/15/2017 for evaluation of ongoing problems with right leg swelling. She had a lower extremity arterial duplex examination done(02/13/17) which showed patent distal right superficial femoral artery stent and above-the-knee popliteal stent without evidence of restenosis. The ABI was more than 1.3 on the right and more than 1.3 on the left. This was consistent with noncompressible arteries due to medial calcification. The right great toe pressure and PPG waveforms are within normal limits and the left great toe pressure and PPG waveforms are decreased. he recommended she continue to wear her compression stockings and continue with elevation. She is scheduled to have a noninvasive arterial study in the near future 08/16/2017 -- had a lower extremity arterial duplex examination done which showed patent distal right superficial femoral artery stent and above-the-knee popliteal stent without evidence of restenosis. The ABI was more than 1.3 on the right and more than 1.3 on the left. This was consistent with noncompressible arteries due to  medial calcification. The right great toe pressure and PPG waveforms are within normal limits and the left great toe pressure and PPG waveforms are decreased. the x-ray of the right ankle has not yet been done 08/24/2017 -- had a right ankle x-ray -- IMPRESSION:1. No fracture, bone lesion or evidence of osteomyelitis. 2. Lateral soft tissue swelling with a soft tissue ulcer. she has not yet seen the vascular surgeon for review 08/31/17 on evaluation today patient's wound appears to be showing signs of improvement. She still with her appointment with vascular in order to review her results of her vascular study and then determine if any intervention would be recommended at that time. No fevers, chills, nausea, or vomiting noted at this time. She has been tolerating the dressing changes without [name], [name] ([id number]) complication. 09/28/17 on evaluation today patient's wound appears to show signs of good improvement in regard to the granulation tissue which is surfacing. There is still a layer of slough covering the wound and the posterior portion is still significantly deeper than the anterior nonetheless there has been some good sign of things moving towards the better. She  is going to go back to Dr. dew for reevaluation to ensure her blood flow is still appropriate. That will be before her next evaluation with [place] next week. No fevers, chills, nausea, or vomiting noted at this time. Patient does have some discomfort rated to be a 3-4/10 depending on activity specifically cleansing the wound makes it worse. 10/05/2017 -- the patient was seen by Dr. Lucky Cowboy last week and noninvasive studies showed a normal right ABI with brisk triphasic waveforms consistent with no arterial insufficiency including normal digital pressures. The duplex showed a patent distal right SFA stent and the proximal SFA was also normal. He was pleased with her test and thought she should have enough of perfusion for  normal wound healing. He would see her back in 6 months time. Patient History Information obtained from Patient. Family History Cancer - Father,Siblings, Heart Disease - Siblings, No family history of Diabetes, Hereditary Spherocytosis, Hypertension, Kidney Disease, Lung Disease, Seizures, Stroke, Thyroid Problems, Tuberculosis. Social History Never smoker, Marital Status - Married, Alcohol Use - Never, Drug Use - No History, Caffeine Use - Rarely. Objective Constitutional Pulse regular. Respirations normal and unlabored. Afebrile. Vitals Time Taken: 12:47 PM, Height: 65 in, Weight: 154.3 lbs, BMI: 25.7, Temperature: 97.9 F, Pulse: 88 bpm, Respiratory Rate: 16 breaths/min, Blood Pressure: 135/75 mmHg. Eyes Nonicteric. Reactive to light. Ears, Nose, Mouth, and Throat Lips, teeth, and gums WNL.Marland Kitchen Moist mucosa without lesions. Neck supple and nontender. No palpable supraclavicular or cervical adenopathy. Normal sized without goiter. Respiratory WNL. No retractions.. Cardiovascular Pedal Pulses WNL. No clubbing, cyanosis or edema. [name], [name] ([id number]) Lymphatic No adneopathy. No adenopathy. No adenopathy. Musculoskeletal Adexa without tenderness or enlargement.. Digits and nails w/o clubbing, cyanosis, infection, petechiae, ischemia, or inflammatory conditions.Marland Kitchen Psychiatric Judgement and insight Intact.. No evidence of depression, anxiety, or agitation.. General Notes: the main wound is looking good and the surrounding area has less fungal infection but I believe she has a reaction to the bordered foam and this is causing a bit of redness over the area where there is tape. There is no evidence of cellulitis Integumentary (Hair, Skin) No suspicious lesions. No crepitus or fluctuance. No peri-wound warmth or erythema. No masses.. Wound #1 status is Open. Original cause of wound was Gradually Appeared. The wound is located on the Right,Lateral Malleolus. The wound measures  1.3cm length x 1.3cm width x 0.2cm depth; 1.327cm^2 area and 0.265cm^3 volume. There is Fat Layer (Subcutaneous Tissue) Exposed exposed. There is no tunneling or undermining noted. There is a large amount of serous drainage noted. The wound margin is distinct with the outline attached to the wound base. There is medium (34- 66%) red granulation within the wound bed. There is a medium (34-66%) amount of necrotic tissue within the wound bed including Adherent Slough. The periwound skin appearance exhibited: Maceration, Erythema. The surrounding wound skin color is noted with erythema which is circumferential. Periwound temperature was noted as No Abnormality. The periwound has tenderness on palpation. Assessment Active Problems ICD-10 L97.312 - 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d I70.233 - Atherosclerosis of native arteries of right leg with ulceration of ankle I89.0 - Lymphedema, not elsewhere classified B35.4 - Tinea corporis Plan Wound Cleansing: Wound #1 Right,Lateral Malleolus: Clean wound with Normal Saline. Cleanse wound with mild soap and water May Shower, gently pat wound dry prior to applying new dressing. Skin Barriers/Peri-Wound Care: Wound #1 Right,Lateral Malleolus: Skin Prep Antifungal cream Triamcinolone Acetonide Ointment [place], Baird [name] ([id number]) Primary Wound Dressing: Wound #  1 Right,Lateral Malleolus: Silvercel Non-Adherent Secondary Dressing: Wound #1 Right,Lateral Malleolus: Dry Gauze Conform/Kerlix Dressing Change Frequency: Wound #1 Right,Lateral Malleolus: Change dressing every other day. Follow-up Appointments: Return Appointment in 1 week. Edema Control: Wound #1 Right,Lateral Malleolus: Elevate legs to the level of the heart and pump ankles as often as possible Other: - wear your compression stockings order juxtalite Off-Loading: Wound #1 Right,Lateral Malleolus: Turn and reposition every 2 hours Other: - do not  put pressure on your right ankle Additional Orders / Instructions: Wound #1 Right,Lateral Malleolus: Increase protein intake. Other: - Add Vitamin C, Zinc, Vitamin A to your diet The following medication(s) was prescribed: lidocaine topical 4 % cream 1 1 cream topical or lymphedema has increased significantly and I do not believe she is elevating and exercising and wearing her compression stockings. I have recommended juxta light stockings to be bought if her insurance company does not cover it. After a review, I have recommended: 1. Silver alginate with drawtex locally to be applied with a Gauze and Kerlix dressing after washing with soap and water. she may be developing an allergy to the bordered foam 2. liberal use of the antifungal to the surrounding skin -- econazole prescribed last week 3. Continue to wear her compression stockings as before -- juxta lites have also been prescribed 4. elevation and exercise discussed with her in great detail 5. Regular visits to the wound center Electronic Signature(s) Signed: 11/16/2017 1:10:27 PM By: Christin Fudge MD, FACS Entered By: Christin Fudge on 11/16/2017 13:10:27 [name], [name] ([id number]) -------------------------------------------------------------------------------- ROS/PFSH Details Patient Name: [name] Date of Service: 11/16/2017 12:30 PM Medical Record Number: [id number] Patient Account Number: [id number] Date of Birth/Sex: [birth year] (83 y.o. Female) Treating RN: Ahmed Prima Primary Care Provider: FITZGERALD, DAVID Other Clinician: Referring Provider: FITZGERALD, DAVID Treating Provider/Extender: Frann Rider in Treatment: 14 Information Obtained From Patient Wound History Do you currently have one or more open woundso Yes How many open wounds do you currently haveo 1 Approximately how long have you had your woundso 2 yrs How have you been treating your wound(s) until nowo mupirocin, soaking in  epsom salt Has your wound(s) ever healed and then re-openedo No Have you had any lab work done in the past montho No Have you tested positive for an antibiotic resistant organism (MRSA, VRE)o No Have you tested positive for osteomyelitis (bone infection)o No Have you had any tests for circulation on your legso Yes Who ordered the testo Dr. Lucky Cowboy Where [facility] the test doneo [facility] Medical History: Positive for: Cataracts - surgery Cardiovascular Medical History: Positive for: Congestive Heart Failure; Hypertension Musculoskeletal Medical History: Positive for: Osteoarthritis Neurologic Medical History: Positive for: Neuropathy Oncologic Medical History: Negative for: Received Chemotherapy; Received Radiation HBO Extended History Items Eyes: Cataracts Immunizations Pneumococcal Vaccine: Received Pneumococcal Vaccination: Yes [name], LEVEN[employer]OD ([id number]) Implantable Devices Family and Social History Cancer: Yes - Father,Siblings; Diabetes: No; Heart Disease: Yes - Siblings; Hereditary Spherocytosis: No; Hypertension: No; Kidney Disease: No; Lung Disease: No; Seizures: No; Stroke: No; Thyroid Problems: No; Tuberculosis: No; Never smoker; Marital Status - Married; Alcohol Use: Never; Drug Use: No History; Caffeine Use: Rarely; Financial Concerns: No; Food, Clothing or Shelter Needs: No; Support System Lacking: No; Transportation Concerns: No; Advanced Directives: No; Patient does not want information on Advanced Directives; Do not resuscitate: No; Living Will: Yes (Not Provided); Medical Power of Attorney: No Physician Affirmation I have reviewed and agree with the above information. Electronic Signature(s) Signed: 11/16/2017 4:49:12 PM By: Christin Fudge  MD, FACS Signed: 11/16/2017 4:53:17 PM By: Alric Quan Entered By: Christin Fudge on 11/16/2017 13:07:42 [name], [name]  ([id number]) -------------------------------------------------------------------------------- [place] Details Patient Name: [name] Date of Service: 11/16/2017 Medical Record Number: [id number] Patient Account Number: [id number] Date of Birth/Sex: 1923-01-15 (83 y.o. 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 I70.233 Atherosclerosis of native arteries of right leg with ulceration of ankle I89.0 Lymphedema, not elsewhere classified B35.4 Tinea corporis Facility Procedures CPT4 Code: 25852778 Description: 99213 - WOUND CARE VISIT-[name] 3 EST PT Modifier: Quantity: 1 Physician Procedures CPT4 Code: 2423536 Description: 14431 - WC PHYS LEVEL 3 - EST PT ICD-10 Diagnosis Description V40.086 Non-pressure chronic ulcer of right ankle with fat layer expos I70.233 Atherosclerosis of native arteries of right leg with ulceratio I89.0 Lymphedema, not elsewhere  classified B35.4 Tinea corporis Modifier: ed n of ankle Quantity: 1 Electronic Signature(s) Signed: 11/16/2017 4:42:48 PM By: Alric Quan Signed: 11/16/2017 4:49:12 PM By: Christin Fudge MD, FACS Previous Signature: 11/16/2017 1:10:39 PM Version By: Christin Fudge MD, FACS Entered By: Alric Quan on 11/16/2017 16:42:48

## 2017-11-20 NOTE — Progress Notes (Signed)
[name], [name] ([id number]) Visit Report for 11/16/2017 Arrival Information Details Patient Name: [name], [name] Date of Service: 11/16/2017 12:30 PM Medical Record Number: [id number] Patient Account Number: [id number] Date of Birth/Sex: Apr 15, 1923 (83 y.o. Female) Treating RN: Ahmed Prima Primary Care Shanicka Oldenkamp: FITZGERALD, DAVID Other Clinician: Referring Kambra Beachem: FITZGERALD, DAVID Treating Nakhia Levitan/Extender: Frann Rider in Treatment: 14 Visit Information History Since Last Visit All ordered tests and consults were completed: No Patient Arrived: Ambulatory Added or deleted any medications: No Arrival Time: 12:45 Any new allergies or adverse reactions: No Accompanied By: daughter Had a fall or experienced change in No Transfer Assistance: None activities of daily living that may affect Patient Identification Verified: Yes risk of falls: Secondary Verification Process Completed: Yes Signs or symptoms of abuse/neglect since last visito No Patient Requires Transmission-Based No Hospitalized since last visit: No Precautions: Has Dressing in Place as Prescribed: Yes Patient Has Alerts: No Pain Present Now: No Electronic Signature(s) Signed: 11/16/2017 4:53:17 PM By: Alric Quan Entered By: Alric Quan on 11/16/2017 12:47:40 [name], [name] ([id number]) -------------------------------------------------------------------------------- Clinic Level of Care Assessment Details Patient Name: [name] Date of Service: 11/16/2017 12:30 PM Medical Record Number: [id number] Patient Account Number: [id number] Date of Birth/Sex: Apr 25, 1923 (83 y.o. Female) Treating RN: Ahmed Prima Primary Care Orella Cushman: FITZGERALD, DAVID Other Clinician: Referring Giang Hemme: FITZGERALD, DAVID Treating Annalina Needles/Extender: Frann Rider in Treatment: 14 Clinic Level of Care Assessment Items TOOL 4 Quantity Score X - Use when only an EandM is performed on  FOLLOW-UP visit 1 0 ASSESSMENTS - Nursing Assessment / Reassessment X - Reassessment of Co-morbidities (includes updates in patient status) 1 10 X- 1 5 Reassessment of Adherence to Treatment Plan ASSESSMENTS - Wound and Skin Assessment / Reassessment X - Simple Wound Assessment / Reassessment - one wound 1 5 []  - 0 Complex Wound Assessment / Reassessment - multiple wounds []  - 0 Dermatologic / Skin Assessment (not related to wound area) ASSESSMENTS - Focused Assessment []  - Circumferential Edema Measurements - multi extremities 0 []  - 0 Nutritional Assessment / Counseling / Intervention []  - 0 Lower Extremity Assessment (monofilament, tuning fork, pulses) []  - 0 Peripheral Arterial Disease Assessment (using hand held doppler) ASSESSMENTS - Ostomy and/or Continence Assessment and Care []  - Incontinence Assessment and Management 0 []  - 0 Ostomy Care Assessment and Management (repouching, etc.) PROCESS - Coordination of Care X - Simple Patient / Family Education for ongoing care 1 15 []  - 0 Complex (extensive) Patient / Family Education for ongoing care []  - 0 Staff obtains Programmer, systems, Records, Test Results / Process Orders []  - 0 Staff telephones HHA, Nursing Homes / Clarify orders / etc []  - 0 Routine Transfer to another Facility (non-emergent condition) []  - 0 Routine Hospital Admission (non-emergent condition) []  - 0 New Admissions / Biomedical engineer / Ordering NPWT, Apligraf, etc. []  - 0 Emergency Hospital Admission (emergent condition) X- 1 10 Simple Discharge Coordination [name], [name] ([id number]) []  - 0 Complex (extensive) Discharge Coordination PROCESS - Special Needs []  - Pediatric / Minor Patient Management 0 []  - 0 Isolation Patient Management []  - 0 Hearing / Language / Visual special needs []  - 0 Assessment of Community assistance (transportation, D/C planning, etc.) []  - 0 Additional assistance / Altered mentation []  - 0 Support  Surface(s) Assessment (bed, cushion, seat, etc.) INTERVENTIONS - Wound Cleansing / Measurement X - Simple Wound Cleansing - one wound 1 5 []  - 0 Complex Wound Cleansing - multiple wounds X- 1 5 Wound Imaging (photographs - any  number of wounds) []  - 0 Wound Tracing (instead of photographs) X- 1 5 Simple Wound Measurement - one wound []  - 0 Complex Wound Measurement - multiple wounds INTERVENTIONS - Wound Dressings X - Small Wound Dressing one or multiple wounds 1 10 []  - 0 Medium Wound Dressing one or multiple wounds []  - 0 Large Wound Dressing one or multiple wounds X- 1 5 Application of Medications - topical []  - 0 Application of Medications - injection INTERVENTIONS - Miscellaneous []  - External ear exam 0 []  - 0 Specimen Collection (cultures, biopsies, blood, body fluids, etc.) []  - 0 Specimen(s) / Culture(s) sent or taken to Lab for analysis []  - 0 Patient Transfer (multiple staff / Civil Service fast streamer / Similar devices) []  - 0 Simple Staple / Suture removal (25 or less) []  - 0 Complex Staple / Suture removal (26 or more) []  - 0 Hypo / Hyperglycemic Management (close monitor of Blood Glucose) []  - 0 Ankle / Brachial Index (ABI) - do not check if billed separately X- 1 5 Vital Signs [name], [name] ([id number]) Has the patient been seen at the hospital within the last three years: Yes Total Score: 80 Level Of Care: New/Established - Level 3 Electronic Signature(s) Signed: 11/16/2017 4:53:17 PM By: Alric Quan Entered By: Alric Quan on 11/16/2017 16:42:40 [name], [name] ([id number]) -------------------------------------------------------------------------------- Encounter Discharge Information Details Patient Name: [name] Date of Service: 11/16/2017 12:30 PM Medical Record Number: [id number] Patient Account Number: [id number] Date of Birth/Sex: [birth year] (83 y.o. Female) Treating RN: Ahmed Prima Primary Care Aniyiah Zell: FITZGERALD,  DAVID Other Clinician: Referring Shimon Trowbridge: FITZGERALD, DAVID Treating Fabrice Dyal/Extender: Frann Rider in Treatment: 14 Encounter Discharge Information Items Discharge Pain Level: 0 Discharge Condition: Stable Ambulatory Status: Ambulatory Discharge Destination: Home Transportation: Private Auto Accompanied By: daughter Schedule Follow-up Appointment: Yes Medication Reconciliation completed and No provided to Patient/Care Deardra Hinkley: Provided on Clinical Summary of Care: 11/16/2017 Form Type Recipient Paper Patient WS Electronic Signature(s) Signed: 11/20/2017 11:15:46 AM By: Ruthine Dose Entered By: Ruthine Dose on 11/16/2017 13:19:32 [name], [name] ([id number]) -------------------------------------------------------------------------------- Lower Extremity Assessment Details Patient Name: [name] Date of Service: 11/16/2017 12:30 PM Medical Record Number: [id number] Patient Account Number: [id number] Date of Birth/Sex: 27-Apr-1923 (83 y.o. Female) Treating RN: Ahmed Prima Primary Care Dashawna Delbridge: FITZGERALD, DAVID Other Clinician: Referring Marlena Barbato: FITZGERALD, DAVID Treating Elease Swarm/Extender: Frann Rider in Treatment: 14 Edema Assessment Assessed: [Left: No] [Right: No] [Left: Edema] [Right: :] Calf Left: Right: Point of Measurement: 35 cm From Medial Instep cm 30.5 cm Ankle Left: Right: Point of Measurement: 12 cm From Medial Instep cm 22.5 cm Vascular Assessment Pulses: Dorsalis Pedis Palpable: [Right:Yes] Posterior Tibial Extremity colors, hair growth, and conditions: Extremity Color: [Right:Hyperpigmented] Capillary Refill: [Right:< 3 seconds] Toe Nail Assessment Left: Right: Thick: Yes Discolored: Yes Deformed: No Improper Length and Hygiene: No Notes hhel to knee 44cm Electronic Signature(s) Signed: 11/16/2017 4:53:17 PM By: Alric Quan Entered By: Alric Quan on 11/16/2017 13:06:20 [name], [name]  ([id number]) -------------------------------------------------------------------------------- Multi Wound Chart Details Patient Name: [name] Date of Service: 11/16/2017 12:30 PM Medical Record Number: [id number] Patient Account Number: [id number] Date of Birth/Sex: [birth year] (83 y.o. Female) Treating RN: Ahmed Prima Primary Care Daryl Beehler: FITZGERALD, DAVID Other Clinician: Referring Beverlyn Mcginness: FITZGERALD, DAVID Treating Deddrick Saindon/Extender: Frann Rider in Treatment: 14 Vital Signs Height(in): 65 Pulse(bpm): 88 Weight(lbs): 154.3 Blood Pressure(mmHg): 135/75 Body Mass Index(BMI): 26 Temperature(F): 97.9 Respiratory Rate 16 (breaths/min): Photos: [1:No Photos] [N/A:N/A] Wound Location: [1:Right Malleolus - Lateral] [N/A:N/A] Wounding Event: [1:Gradually Appeared] [  N/A:N/A] Primary Etiology: [1:Lymphedema] [N/A:N/A] Secondary Etiology: [1:Arterial Insufficiency Ulcer] [N/A:N/A] Comorbid History: [1:Cataracts, Congestive Heart Failure, Hypertension, Osteoarthritis, Neuropathy] [N/A:N/A] Date Acquired: [1:08/11/2015] [N/A:N/A] Weeks of Treatment: [1:14] [N/A:N/A] Wound Status: [1:Open] [N/A:N/A] Measurements L x W x D [1:1.3x1.3x0.2] [N/A:N/A] (cm) Area (cm) : [1:1.327] [N/A:N/A] Volume (cm) : [1:0.265] [N/A:N/A] % Reduction in Area: [1:13.40%] [N/A:N/A] % Reduction in Volume: [1:42.30%] [N/A:N/A] Classification: [1:Full Thickness Without Exposed Support Structures] [N/A:N/A] Exudate Amount: [1:Large] [N/A:N/A] Exudate Type: [1:Serous] [N/A:N/A] Exudate Color: [1:amber] [N/A:N/A] Wound Margin: [1:Distinct, outline attached] [N/A:N/A] Granulation Amount: [1:Medium (34-66%)] [N/A:N/A] Granulation Quality: [1:Red] [N/A:N/A] Necrotic Amount: [1:Medium (34-66%)] [N/A:N/A] Exposed Structures: [1:Fat Layer (Subcutaneous Tissue) Exposed: Yes] [N/A:N/A] Epithelialization: [1:Small (1-33%)] [N/A:N/A] Periwound Skin Texture: [1:No Abnormalities Noted]  [N/A:N/A] Periwound Skin Moisture: [1:Maceration: Yes] [N/A:N/A] Periwound Skin Color: [1:Erythema: Yes] [N/A:N/A] Erythema Location: [1:Circumferential] [N/A:N/A] Temperature: [1:No Abnormality] [N/A:N/A] Tenderness on Palpation: [1:Yes] [N/A:N/A] Wound Preparation: [1:Ulcer Cleansing: Rinsed/Irrigated with Saline] [N/A:N/A] Topical Anesthetic Applied: Other: lidocaine 4% Treatment Notes Wound #1 (Right, Lateral Malleolus) 1. Cleansed with: Clean wound with Normal Saline 2. Anesthetic Topical Lidocaine 4% cream to wound bed prior to debridement 3. Peri-wound Care: Antifungal cream 4. Dressing Applied: Other dressing (specify in notes) 5. Secondary Dressing Applied Dry Gauze Kerlix/Conform 7. Secured with Tape Notes silvercel, TCA Electronic Signature(s) Signed: 11/16/2017 1:07:20 PM By: Christin Fudge MD, FACS Entered By: Christin Fudge on 11/16/2017 13:07:19 [name], [name] ([id number]) -------------------------------------------------------------------------------- [place] Details Patient Name: [name] Date of Service: 11/16/2017 12:30 PM Medical Record Number: [id number] Patient Account Number: [id number] Date of Birth/Sex: 03-01-1923 (83 y.o. Female) Treating RN: Ahmed Prima Primary Care Ronnell Makarewicz: FITZGERALD, DAVID Other Clinician: Referring [place]on Yearby: FITZGERALD, DAVID Treating Gelisa Tieken/Extender: Frann Rider in Treatment: 14 Active Inactive ` Abuse / Safety / Falls / Self Care Management Nursing Diagnoses: Potential for falls Goals: Patient will not experience any injury related to falls Date Initiated: 08/10/2017 Target Resolution Date: 11/10/2017 Goal Status: Active Interventions: Assess Activities of Daily Living upon admission and as needed Assess fall risk on admission and as needed Assess: immobility, friction, shearing, incontinence upon admission and as needed Notes: ` Nutrition Nursing  Diagnoses: Imbalanced nutrition Potential for alteratiion in Nutrition/Potential for imbalanced nutrition Goals: Patient/caregiver agrees to and verbalizes understanding of need to use nutritional supplements and/or vitamins as prescribed Date Initiated: 08/10/2017 Target Resolution Date: 12/08/2017 Goal Status: Active Interventions: Assess patient nutrition upon admission and as needed per policy Notes: ` Orientation to the Wound Care Program Nursing Diagnoses: Knowledge deficit related to the wound healing center program Goals: Patient/caregiver will verbalize understanding of the [facility] Date Initiated: 08/10/2017 Target Resolution Date: 09/08/2017 BONNIEJEAN, PIANO ([id number]) Goal Status: Active Interventions: Provide education on orientation to the wound center Notes: ` Pain, Acute or Chronic Nursing Diagnoses: Pain, acute or chronic: actual or potential Potential alteration in comfort, pain Goals: Patient/caregiver will verbalize adequate pain control between visits Date Initiated: 08/10/2017 Target Resolution Date: 12/08/2017 Goal Status: Active Interventions: Complete pain assessment as per visit requirements Notes: ` Wound/Skin Impairment Nursing Diagnoses: Impaired tissue integrity Knowledge deficit related to ulceration/compromised skin integrity Goals: Ulcer/skin breakdown will have a volume reduction of 80% by week 12 Date Initiated: 08/10/2017 Target Resolution Date: 12/01/2017 Goal Status: Active Interventions: Assess patient/caregiver ability to perform ulcer/skin care regimen upon admission and as needed Notes: Electronic Signature(s) Signed: 11/16/2017 4:53:17 PM By: Alric Quan Entered By: Alric Quan on 11/16/2017 12:54:17 [name], [name] ([id number]) -------------------------------------------------------------------------------- Pain Assessment Details Patient Name: [name] Date of  Service: 11/16/2017  12:30 PM Medical Record Number: [id number] Patient Account Number: [id number] Date of Birth/Sex: 1923/10/02 (83 y.o. Female) Treating RN: Ahmed Prima Primary Care Jivan Symanski: FITZGERALD, DAVID Other Clinician: Referring Jaymarion Trombly: FITZGERALD, DAVID Treating Damarea Merkel/Extender: Frann Rider in Treatment: 14 Active Problems Location of Pain Severity and Description of Pain Patient Has Paino No Site Locations Pain Management and Medication Current Pain Management: Electronic Signature(s) Signed: 11/16/2017 4:53:17 PM By: Alric Quan Entered By: Alric Quan on 11/16/2017 12:47:45 [name], [name] ([id number]) -------------------------------------------------------------------------------- Patient/Caregiver Education Details Patient Name: [name] Date of Service: 11/16/2017 12:30 PM Medical Record Number: [id number] Patient Account Number: [id number] Date of Birth/Gender: [birth year] (83 y.o. Female) Treating RN: Ahmed Prima Primary Care Physician: FITZGERALD, DAVID Other Clinician: Referring Physician: FITZGERALD, DAVID Treating Physician/Extender: Frann Rider in Treatment: 14 Education Assessment Education Provided To: Patient Education Topics Provided Wound/Skin Impairment: Handouts: Other: change dressing as ordered Methods: Demonstration, Explain/Verbal Responses: State content correctly Electronic Signature(s) Signed: 11/16/2017 4:53:17 PM By: Alric Quan Entered By: Alric Quan on 11/16/2017 12:59:12 [name], [name] ([id number]) -------------------------------------------------------------------------------- Wound Assessment Details Patient Name: [name] Date of Service: 11/16/2017 12:30 PM Medical Record Number: [id number] Patient Account Number: [id number] Date of Birth/Sex: 1923-01-13 (83 y.o. Female) Treating RN: Ahmed Prima Primary Care Gae Bihl: FITZGERALD, DAVID Other Clinician: Referring  Chanae Gemma: FITZGERALD, DAVID Treating Aletha Allebach/Extender: Frann Rider in Treatment: 14 Wound Status Wound Number: 1 Primary Lymphedema Etiology: Wound Location: Right Malleolus - Lateral Secondary Arterial Insufficiency Ulcer Wounding Event: Gradually Appeared Etiology: Date Acquired: 08/11/2015 Wound Status: Open Weeks Of Treatment: 14 Comorbid Cataracts, Congestive Heart Failure, Clustered Wound: No History: Hypertension, Osteoarthritis, Neuropathy Photos Photo Uploaded By: Alric Quan on 11/19/2017 16:00:14 Wound Measurements Length: (cm) 1.3 Width: (cm) 1.3 Depth: (cm) 0.2 Area: (cm) 1.327 Volume: (cm) 0.265 % Reduction in Area: 13.4% % Reduction in Volume: 42.3% Epithelialization: Small (1-33%) Tunneling: No Undermining: No Wound Description Full Thickness Without Exposed Support Foul Classification: Structures Slou Wound Margin: Distinct, outline attached Exudate Large Amount: Exudate Type: Serous Exudate Color: amber Odor After Cleansing: No gh/Fibrino Yes Wound Bed Granulation Amount: Medium (34-66%) Exposed Structure Granulation Quality: Red Fat Layer (Subcutaneous Tissue) Exposed: Yes Necrotic Amount: Medium (34-66%) Necrotic Quality: Adherent Slough Periwound Skin Texture Texture Color [place], [name] ([id number]) No Abnormalities Noted: No No Abnormalities Noted: No Erythema: Yes Moisture Erythema Location: Circumferential No Abnormalities Noted: No Maceration: Yes Temperature / Pain Temperature: No Abnormality Tenderness on Palpation: Yes Wound Preparation Ulcer Cleansing: Rinsed/Irrigated with Saline Topical Anesthetic Applied: Other: lidocaine 4%, Treatment Notes Wound #1 (Right, Lateral Malleolus) 1. Cleansed with: Clean wound with Normal Saline 2. Anesthetic Topical Lidocaine 4% cream to wound bed prior to debridement 3. Peri-wound Care: Antifungal cream 4. Dressing Applied: Other dressing (specify in notes) 5.  Secondary Dressing Applied Dry Gauze Kerlix/Conform 7. Secured with Tape Notes silvercel, TCA Electronic Signature(s) Signed: 11/16/2017 4:53:17 PM By: Alric Quan Entered By: Alric Quan on 11/16/2017 12:53:46 Bahner, [name] ([id number]) -------------------------------------------------------------------------------- [place] Details Patient Name: [name] Date of Service: 11/16/2017 12:30 PM Medical Record Number: [id number] Patient Account Number: [id number] Date of Birth/Sex: [birth year] (83 y.o. Female) Treating RN: Ahmed Prima Primary Care Jamaia Brum: FITZGERALD, DAVID Other Clinician: Referring Vang Kraeger: FITZGERALD, DAVID Treating Zackery Brine/Extender: Frann Rider in Treatment: 14 Vital Signs Time Taken: 12:47 Temperature (F): 97.9 Height (in): 65 Pulse (bpm): 88 Weight (lbs): 154.3 Respiratory Rate (breaths/min): 16 Body Mass Index (BMI): 25.7 Blood Pressure (mmHg): 135/75 Reference Range: 80 - 120 mg / dl Electronic Signature(s)  Signed: 11/16/2017 4:53:17 PM By: Alric Quan Entered By: Alric Quan on 11/16/2017 12:50:42

## 2017-11-23 ENCOUNTER — Encounter: Payer: Medicare Other | Admitting: Physician Assistant

## 2017-11-23 DIAGNOSIS — I70233 Atherosclerosis of native arteries of right leg with ulceration of ankle: Secondary | ICD-10-CM | POA: Diagnosis not present

## 2017-11-28 NOTE — Progress Notes (Addendum)
TANIQUA, ISSA ([id number]) Visit Report for 11/23/2017 Arrival Information Details Patient Name: [name], [name] Date of Service: 11/23/2017 12:30 PM Medical Record Number: [id number] Patient Account Number: [id number] Date of Birth/Sex: 05-29-1923 (83 y.o. Female) Treating RN: Montey Hora Primary Care Barbarajean Kinzler: FITZGERALD, DAVID Other Clinician: Referring Alvester Eads: FITZGERALD, DAVID Treating Devora Tortorella/Extender: STONE III, HOYT Weeks in Treatment: 15 Visit Information History Since Last Visit Added or deleted any medications: No Patient Arrived: Ambulatory Any new allergies or adverse reactions: No Arrival Time: 12:48 Had a fall or experienced change in No Accompanied By: dtr activities of daily living that may affect Transfer Assistance: None risk of falls: Patient Identification Verified: Yes Signs or symptoms of abuse/neglect since last visito No Secondary Verification Process Completed: Yes Hospitalized since last visit: No Patient Requires Transmission-Based No Has Dressing in Place as Prescribed: Yes Precautions: Has Compression in Place as Prescribed: Yes Patient Has Alerts: No Pain Present Now: No Electronic Signature(s) Signed: 11/28/2017 11:54:08 AM By: Montey Hora Previous Signature: 11/23/2017 4:38:31 PM Version By: Montey Hora Entered By: Montey Hora on 11/28/2017 11:54:08 Jarvie, [name] ([id number]) -------------------------------------------------------------------------------- Encounter Discharge Information Details Patient Name: [name] Date of Service: 11/23/2017 12:30 PM Medical Record Number: [id number] Patient Account Number: [id number] Date of Birth/Sex: 1923/10/09 (83 y.o. Female) Treating RN: Ahmed Prima Primary Care Lafreda Casebeer: FITZGERALD, DAVID Other Clinician: Referring Atonya Templer: FITZGERALD, DAVID Treating Sadler Teschner/Extender: STONE III, HOYT Weeks in Treatment: 15 Encounter Discharge Information  Items Discharge Pain Level: 0 Discharge Condition: Stable Ambulatory Status: Ambulatory Discharge Destination: Home Transportation: Private Auto Accompanied By: dtr Schedule Follow-up Appointment: Yes Medication Reconciliation completed and No provided to Patient/Care Rubyann Lingle: Provided on Clinical Summary of Care: 11/23/2017 Form Type Recipient Paper Patient WS Electronic Signature(s) Signed: 11/28/2017 11:59:35 AM By: Montey Hora Previous Signature: 11/28/2017 10:27:05 AM Version By: Ruthine Dose Entered By: Montey Hora on 11/28/2017 11:59:35 Switala, [name] ([id number]) -------------------------------------------------------------------------------- Lower Extremity Assessment Details Patient Name: [name] Date of Service: 11/23/2017 12:30 PM Medical Record Number: [id number] Patient Account Number: [id number] Date of Birth/Sex: 01-Mar-1923 (83 y.o. Female) Treating RN: Montey Hora Primary Care Analee Montee: FITZGERALD, DAVID Other Clinician: Referring Tagen Milby: FITZGERALD, DAVID Treating Dayquan Buys/Extender: STONE III, HOYT Weeks in Treatment: 15 Edema Assessment Assessed: [Left: No] [Right: No] [Left: Edema] [Right: :] Calf Left: Right: Point of Measurement: 35 cm From Medial Instep cm 32 cm Ankle Left: Right: Point of Measurement: 12 cm From Medial Instep cm 21 cm Vascular Assessment Pulses: Dorsalis Pedis Palpable: [Right:Yes] Posterior Tibial Extremity colors, hair growth, and conditions: Extremity Color: [Right:Hyperpigmented] Hair Growth on Extremity: [Right:No] Temperature of Extremity: [Right:Warm] Capillary Refill: [Right:< 3 seconds] Toe Nail Assessment Left: Right: Thick: Yes Discolored: Yes Deformed: No Improper Length and Hygiene: No Notes 2nd toenail on right foot is black Electronic Signature(s) Signed: 11/28/2017 11:54:47 AM By: Montey Hora Previous Signature: 11/23/2017 4:38:31 PM Version By: Montey Hora Entered  By: Montey Hora on 11/28/2017 11:54:47 Cumby, [name] ([id number]) -------------------------------------------------------------------------------- Multi Wound Chart Details Patient Name: [name] Date of Service: 11/23/2017 12:30 PM Medical Record Number: [id number] Patient Account Number: [id number] Date of Birth/Sex: 1923-04-20 (83 y.o. Female) Treating RN: Montey Hora Primary Care Berea Majkowski: FITZGERALD, DAVID Other Clinician: Referring Sanel Stemmer: FITZGERALD, DAVID Treating Zelene Barga/Extender: STONE III, HOYT Weeks in Treatment: 15 Vital Signs Height(in): 65 Pulse(bpm): 85 Weight(lbs): 154.3 Blood Pressure(mmHg): 127/70 Body Mass Index(BMI): 26 Temperature(F): 98.0 Respiratory Rate 16 (breaths/min): Photos: [N/A:N/A] Wound Location: Right Malleolus - Lateral N/A N/A Wounding Event: Gradually Appeared N/A N/A Primary Etiology: Lymphedema N/A N/A  Secondary Etiology: Arterial Insufficiency Ulcer N/A N/A Comorbid History: Cataracts, Congestive Heart N/A N/A Failure, Hypertension, Osteoarthritis, Neuropathy Date Acquired: 08/11/2015 N/A N/A Weeks of Treatment: 15 N/A N/A Wound Status: Open N/A N/A Measurements L x W x D 0.9x1.2x0.2 N/A N/A (cm) Area (cm) : 0.848 N/A N/A Volume (cm) : 0.17 N/A N/A % Reduction in Area: 44.60% N/A N/A % Reduction in Volume: 63.00% N/A N/A Classification: Full Thickness Without N/A N/A Exposed Support Structures Exudate Amount: Large N/A N/A Exudate Type: Serous N/A N/A Exudate Color: amber N/A N/A Wound Margin: Distinct, outline attached N/A N/A Granulation Amount: Medium (34-66%) N/A N/A Granulation Quality: Red N/A N/A Necrotic Amount: Medium (34-66%) N/A N/A Exposed Structures: Fat Layer (Subcutaneous N/A N/A Tissue) Exposed: Yes Epithelialization: Small (1-33%) N/A N/A Debridement: Debridement ([id number]) N/A N/A Labat, [name] ([id number]) Pre-procedure 13:03 N/A N/A Verification/Time Out Taken: Pain  Control: Lidocaine 4% Topical Solution N/A N/A Tissue Debrided: Fibrin/Slough, Subcutaneous N/A N/A Level: Skin/Subcutaneous Tissue N/A N/A Debridement Area (sq cm): 1.08 N/A N/A Instrument: Curette N/A N/A Bleeding: Minimum N/A N/A Hemostasis Achieved: Pressure N/A N/A Procedural Pain: 0 N/A N/A Post Procedural Pain: 0 N/A N/A Debridement Treatment Procedure was tolerated well N/A N/A Response: Post Debridement 0.9x1.2x0.3 N/A N/A Measurements L x W x D (cm) Post Debridement Volume: 0.254 N/A N/A (cm) Periwound Skin Texture: No Abnormalities Noted N/A N/A Periwound Skin Moisture: Maceration: Yes N/A N/A Periwound Skin Color: Erythema: Yes N/A N/A Erythema Location: Circumferential N/A N/A Temperature: No Abnormality N/A N/A Tenderness on Palpation: Yes N/A N/A Wound Preparation: Ulcer Cleansing: N/A N/A Rinsed/Irrigated with Saline Topical Anesthetic Applied: Other: lidocaine 4% Procedures Performed: Debridement N/A N/A Treatment Notes Electronic Signature(s) Signed: 11/28/2017 11:55:31 AM By: Montey Hora Previous Signature: 11/23/2017 4:38:31 PM Version By: Montey Hora Entered By: Montey Hora on 11/28/2017 11:55:31 Biever, [name] ([id number]) -------------------------------------------------------------------------------- [place] Details Patient Name: [name] Date of Service: 11/23/2017 12:30 PM Medical Record Number: [id number] Patient Account Number: [id number] Date of Birth/Sex: Dec 01, 1923 (83 y.o. Female) Treating RN: Montey Hora Primary Care Caz Weaver: FITZGERALD, DAVID Other Clinician: Referring Garnett Rekowski: FITZGERALD, DAVID Treating Bettylee Feig/Extender: STONE III, HOYT Weeks in Treatment: 15 Active Inactive ` Abuse / Safety / Falls / Self Care Management Nursing Diagnoses: Potential for falls Goals: Patient will not experience any injury related to falls Date Initiated: 08/10/2017 Target Resolution Date:  11/10/2017 Goal Status: Active Interventions: Assess Activities of Daily Living upon admission and as needed Assess fall risk on admission and as needed Assess: immobility, friction, shearing, incontinence upon admission and as needed Notes: ` Nutrition Nursing Diagnoses: Imbalanced nutrition Potential for alteratiion in Nutrition/Potential for imbalanced nutrition Goals: Patient/caregiver agrees to and verbalizes understanding of need to use nutritional supplements and/or vitamins as prescribed Date Initiated: 08/10/2017 Target Resolution Date: 12/08/2017 Goal Status: Active Interventions: Assess patient nutrition upon admission and as needed per policy Notes: ` Orientation to the Wound Care Program Nursing Diagnoses: Knowledge deficit related to the wound healing center program Goals: Patient/caregiver will verbalize understanding of the [facility] Date Initiated: 08/10/2017 Target Resolution Date: 09/08/2017 [name], [name] ([id number]) Goal Status: Active Interventions: Provide education on orientation to the wound center Notes: ` Pain, Acute or Chronic Nursing Diagnoses: Pain, acute or chronic: actual or potential Potential alteration in comfort, pain Goals: Patient/caregiver will verbalize adequate pain control between visits Date Initiated: 08/10/2017 Target Resolution Date: 12/08/2017 Goal Status: Active Interventions: Complete pain assessment as per visit requirements Notes: ` Wound/Skin Impairment Nursing Diagnoses: Impaired tissue  integrity Knowledge deficit related to ulceration/compromised skin integrity Goals: Ulcer/skin breakdown will have a volume reduction of 80% by week 12 Date Initiated: 08/10/2017 Target Resolution Date: 12/01/2017 Goal Status: Active Interventions: Assess patient/caregiver ability to perform ulcer/skin care regimen upon admission and as needed Notes: Electronic Signature(s) Signed: 11/28/2017 11:55:08 AM By:  Montey Hora Previous Signature: 11/23/2017 4:38:31 PM Version By: Montey Hora Entered By: Montey Hora on 11/28/2017 11:55:08 Wiesen, [name] ([id number]) -------------------------------------------------------------------------------- Pain Assessment Details Patient Name: [name] Date of Service: 11/23/2017 12:30 PM Medical Record Number: [id number] Patient Account Number: [id number] Date of Birth/Sex: Feb 25, 1923 (83 y.o. Female) Treating RN: Montey Hora Primary Care Bev Drennen: FITZGERALD, DAVID Other Clinician: Referring Taimane Stimmel: FITZGERALD, DAVID Treating Kylee Nardozzi/Extender: STONE III, HOYT Weeks in Treatment: 15 Active Problems Location of Pain Severity and Description of Pain Patient Has Paino No Site Locations Pain Management and Medication Current Pain Management: Electronic Signature(s) Signed: 11/28/2017 11:54:24 AM By: Montey Hora Previous Signature: 11/23/2017 4:38:31 PM Version By: Montey Hora Entered By: Montey Hora on 11/28/2017 11:54:24 Cubero, [name] ([id number]) -------------------------------------------------------------------------------- Patient/Caregiver Education Details Patient Name: [name] Date of Service: 11/23/2017 12:30 PM Medical Record Number: [id number] Patient Account Number: [id number] Date of Birth/Gender: March 21, 1923 (83 y.o. Female) Treating RN: Montey Hora Primary Care Physician: FITZGERALD, DAVID Other Clinician: Referring Physician: FITZGERALD, DAVID Treating Physician/Extender: Melburn Hake, HOYT Weeks in Treatment: 15 Education Assessment Education Provided To: Patient and Caregiver Education Topics Provided Wound/Skin Impairment: Handouts: Other: wound care as ordered Methods: Demonstration, Explain/Verbal Responses: State content correctly Electronic Signature(s) Signed: 11/28/2017 4:52:08 PM By: Montey Hora Entered By: Montey Hora on 11/28/2017 11:59:51 Starnes, [name]  ([id number]) -------------------------------------------------------------------------------- Wound Assessment Details Patient Name: [name] Date of Service: 11/23/2017 12:30 PM Medical Record Number: [id number] Patient Account Number: [id number] Date of Birth/Sex: 21-Jul-1923 (83 y.o. Female) Treating RN: Montey Hora Primary Care Tikisha Molinaro: FITZGERALD, DAVID Other Clinician: Referring Reginae Wolfrey: FITZGERALD, DAVID Treating Jarreau Callanan/Extender: Frann Rider in Treatment: 15 Wound Status Wound Number: 1 Primary Lymphedema Etiology: Wound Location: Right Malleolus - Lateral Secondary Arterial Insufficiency Ulcer Wounding Event: Gradually Appeared Etiology: Date Acquired: 08/11/2015 Wound Status: Open Weeks Of Treatment: 15 Comorbid Cataracts, Congestive Heart Failure, Clustered Wound: No History: Hypertension, Osteoarthritis, Neuropathy Photos Photo Uploaded By: Montey Hora on 11/23/2017 14:31:18 Wound Measurements Length: (cm) 0.9 Width: (cm) 1.2 Depth: (cm) 0.2 Area: (cm) 0.848 Volume: (cm) 0.17 % Reduction in Area: 44.6% % Reduction in Volume: 63% Epithelialization: Small (1-33%) Tunneling: No Undermining: No Wound Description Full Thickness Without Exposed Support Foul O Classification: Structures Slough Wound Margin: Distinct, outline attached Exudate Large Amount: Exudate Type: Serous Exudate Color: amber dor After Cleansing: No /Fibrino Yes Wound Bed Granulation Amount: Medium (34-66%) Exposed Structure Granulation Quality: Red Fat Layer (Subcutaneous Tissue) Exposed: Yes Necrotic Amount: Medium (34-66%) Necrotic Quality: Adherent Slough Periwound Skin Texture Texture Color Web, [name] ([id number]) No Abnormalities Noted: No No Abnormalities Noted: No Erythema: Yes Moisture Erythema Location: Circumferential No Abnormalities Noted: No Maceration: Yes Temperature / Pain Temperature: No Abnormality Tenderness on Palpation:  Yes Wound Preparation Ulcer Cleansing: Rinsed/Irrigated with Saline Topical Anesthetic Applied: Other: lidocaine 4%, Electronic Signature(s) Signed: 11/23/2017 4:38:31 PM By: Montey Hora Entered By: Montey Hora on 11/23/2017 12:55:41 Tinkham, [name] ([id number]) -------------------------------------------------------------------------------- Vitals Details Patient Name: [name] Date of Service: 11/23/2017 12:30 PM Medical Record Number: [id number] Patient Account Number: [id number] Date of Birth/Sex: Oct 19, 1923 (83 y.o. Female) Treating RN: Montey Hora Primary Care Raad Clayson: FITZGERALD, DAVID Other Clinician: Referring Naasir Carreira: FITZGERALD, DAVID Treating Arick Mareno/Extender:  STONE III, HOYT Weeks in Treatment: 15 Vital Signs Time Taken: 12:50 Temperature (F): 98.0 Height (in): 65 Pulse (bpm): 85 Weight (lbs): 154.3 Respiratory Rate (breaths/min): 16 Body Mass Index (BMI): 25.7 Blood Pressure (mmHg): 127/70 Reference Range: 80 - 120 mg / dl Electronic Signature(s) Signed: 11/28/2017 11:54:33 AM By: Montey Hora Previous Signature: 11/23/2017 4:38:31 PM Version By: Montey Hora Entered By: Montey Hora on 11/28/2017 11:54:33

## 2017-11-28 NOTE — Progress Notes (Addendum)
DARLEN, GLEDHILL ([id number]) Visit Report for 11/23/2017 Chief Complaint Document Details Patient Name: [name], [name]. Date of Service: 11/23/2017 12:30 PM Medical Record Number: [id number] Patient Account Number: [id number] Date of Birth/Sex: 09-12-1923 (83 y.o. Female) Treating RN: Ahmed Prima Primary Care Provider: FITZGERALD, DAVID Other Clinician: Referring Provider: FITZGERALD, DAVID Treating Provider/Extender: STONE III, HOYT Weeks in Treatment: 15 Information Obtained from: Patient Chief Complaint Patient presents to the wound care center for a consult due non healing wound to the right lateral malleolus Electronic Signature(s) Signed: 11/28/2017 11:57:12 AM By: Montey Hora Signed: 12/01/2017 6:32:25 PM By: Worthy Keeler PA-C Previous Signature: 11/25/2017 1:59:17 AM Version By: Worthy Keeler PA-C Previous Signature: 11/28/2017 8:01:40 AM Version By: Christin Fudge MD, FACS Entered By: Montey Hora on 11/28/2017 11:57:12 [place], [name] ([id number]) -------------------------------------------------------------------------------- Debridement Details Patient Name: Frazier [name] Date of Service: 11/23/2017 12:30 PM Medical Record Number: [id number] Patient Account Number: [id number] Date of Birth/Sex: 1923-04-03 (83 y.o. Female) Treating RN: Montey Hora Primary Care Provider: FITZGERALD, DAVID Other Clinician: Referring Provider: FITZGERALD, DAVID Treating Provider/Extender: STONE III, HOYT Weeks in Treatment: 15 Debridement Performed for Wound #1 Right,Lateral Malleolus Assessment: Performed By: Physician STONE III, HOYT E., PA-C Debridement: Debridement Severity of Tissue Pre Fat layer exposed Debridement: Pre-procedure Verification/Time Yes - 13:03 Out Taken: Start Time: 13:03 Pain Control: Lidocaine 4% Topical Solution Level: Skin/Subcutaneous Tissue Total Area Debrided (L x W): 0.9 (cm) x 1.2 (cm) = 1.08 (cm) Tissue and other  material Viable, Non-Viable, Fibrin/Slough, Subcutaneous debrided: Instrument: Curette Bleeding: Minimum Hemostasis Achieved: Pressure End Time: 13:05 Procedural Pain: 0 Post Procedural Pain: 0 Response to Treatment: Procedure was tolerated well Post Debridement Measurements of Total Wound Length: (cm) 0.9 Width: (cm) 1.2 Depth: (cm) 0.3 Volume: (cm) 0.254 Character of Wound/Ulcer Post Debridement: Improved Severity of Tissue Post Debridement: Fat layer exposed Post Procedure Diagnosis Same as Pre-procedure Electronic Signature(s) Signed: 11/28/2017 11:55:44 AM By: Montey Hora Signed: 12/01/2017 6:32:25 PM By: Worthy Keeler PA-C Previous Signature: 11/23/2017 4:38:31 PM Version By: Montey Hora Previous Signature: 11/28/2017 8:01:40 AM Version By: Christin Fudge MD, FACS Entered By: Montey Hora on 11/28/2017 11:55:44 [place], [name] ([id number]) -------------------------------------------------------------------------------- HPI Details Patient Name: Frazier [name] Date of Service: 11/23/2017 12:30 PM Medical Record Number: [id number] Patient Account Number: [id number] Date of Birth/Sex: 22-Jun-1923 (83 y.o. Female) Treating RN: Ahmed Prima Primary Care Provider: FITZGERALD, DAVID Other Clinician: Referring Provider: FITZGERALD, DAVID Treating Provider/Extender: STONE III, HOYT Weeks in Treatment: 15 History of Present Illness Location: Patient presents with an ulcer on the right lateral malleolus Quality: Patient reports experiencing a dull pain to affected area(s). Severity: Patient states wound are getting worse. Duration: Patient has had the wound for >2 years prior to seeking treatment at the wound center Timing: Pain in wound is constant (hurts all the time) Context: The wound would happen gradually Modifying Factors: Other treatment(s) tried include:seen a podiatrist on a vascular surgeons and under treatment with them Associated Signs and  Symptoms: Patient reports having increase swelling. 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 Dr. Amalia Hailey the podiatrist saw her on 07/20/2017 and sent her to the wound center for possible hyperbaric oxygen therapy. past medical history of peripheral vascular disease, varicose veins, status post appendectomy, basal cell carcinoma excision from the left leg, cholecystectomy, pacemaker placement, right lower extremity angiography done by Dr. dew in  March 2017 with placement of a stent. there is also note of a successful ablation of the right small saphenous vein done which was reviewed by ultrasound on 10/24/2016. the patient had a right small saphenous vein ablation done on 10/20/2016. The patient has never been a smoker. She has been seen by Dr. Corene Cornea dew the vascular surgeon who most recently saw her on 06/15/2017 for evaluation of ongoing problems with right leg swelling. She had a lower extremity arterial duplex examination done(02/13/17) which showed patent distal right superficial femoral artery stent and above-the-knee popliteal stent without evidence of restenosis. The ABI was more than 1.3 on the right and more than 1.3 on the left. This was consistent with noncompressible arteries due to medial calcification. The right great toe pressure and PPG waveforms are within normal limits and the left great toe pressure and PPG waveforms are decreased. he recommended she continue to wear her compression stockings and continue with elevation. She is scheduled to have a noninvasive arterial study in the near future 08/16/2017 -- had a lower extremity arterial duplex examination done which showed patent distal right superficial femoral artery stent and above-the-knee popliteal stent without evidence of restenosis. The ABI was more  than 1.3 on the right and more than 1.3 on the left. This was consistent with noncompressible arteries due to medial calcification. The right great toe pressure and PPG waveforms are within normal limits and the left great toe pressure and PPG waveforms are decreased. the x-ray of the right ankle has not yet been done 08/24/2017 -- had a right ankle x-ray -- IMPRESSION:1. No fracture, bone lesion or evidence of osteomyelitis. 2. Lateral soft tissue swelling with a soft tissue ulcer. she has not yet seen the vascular surgeon for review 08/31/17 on evaluation today patient's wound appears to be showing signs of improvement. She still with her appointment with vascular in order to review her results of her vascular study and then determine if any intervention would be recommended at that time. No fevers, chills, nausea, or vomiting noted at this time. She has been tolerating the dressing changes without complication. 09/28/17 on evaluation today patient's wound appears to show signs of good improvement in regard to the granulation tissue which is surfacing. There is still a layer of slough covering the wound and the posterior portion is still significantly deeper than the anterior nonetheless there has been some good sign of things moving towards the better. She is going to go back to Dr. dew for reevaluation to ensure her blood flow is still appropriate. That will be before her next evaluation with [place] next week. No fevers, chills, nausea, or vomiting noted at this time. Patient does have some discomfort rated to be a 3-4/10 depending on activity specifically cleansing the wound makes it worse. JNYA, [name] ([id number]) 10/05/2017 -- the patient was seen by Dr. Lucky Cowboy last week and noninvasive studies showed a normal right ABI with brisk triphasic waveforms consistent with no arterial insufficiency including normal digital pressures. The duplex showed a patent distal right SFA stent and the  proximal SFA was also normal. He was pleased with her test and thought she should have enough of perfusion for normal wound healing. He would see her back in 6 months time. 11/23/17 patient's wound appears to be doing excellent today on evaluation. It is filling in nicely with granulation tissue and there is only a small amount of slough over the surface of the wound. Overall she and I both are extremely pleased  with the progress that she has made. No fevers, chills, nausea, or vomiting noted at this time. Electronic Signature(s) Signed: 11/28/2017 11:57:25 AM By: Montey Hora Signed: 12/01/2017 6:32:25 PM By: Worthy Keeler PA-C Previous Signature: 11/25/2017 1:59:17 AM Version By: Worthy Keeler PA-C Previous Signature: 11/28/2017 8:01:40 AM Version By: Christin Fudge MD, FACS Entered By: Montey Hora on 11/28/2017 11:57:24 Mehl, [name] ([id number]) -------------------------------------------------------------------------------- Physical Exam Details Patient Name: Frazier [name] Date of Service: 11/23/2017 12:30 PM Medical Record Number: [id number] Patient Account Number: [id number] Date of Birth/Sex: 19-Dec-1922 (83 y.o. Female) Treating RN: Ahmed Prima Primary Care Provider: FITZGERALD, DAVID Other Clinician: Referring Provider: FITZGERALD, DAVID Treating Provider/Extender: STONE III, HOYT Weeks in Treatment: 68 Constitutional Well-nourished and well-hydrated in no acute distress. Respiratory normal breathing without difficulty. Psychiatric this patient is able to make decisions and demonstrates good insight into disease process. Alert and Oriented x 3. pleasant and cooperative. Notes There does not appear to be any evidence of infection noted on examination today. The slough that was covering the wound was sharply debride it today using a curette and patient tolerated this well without complication. Electronic Signature(s) Signed: 11/28/2017 11:57:46 AM By:  Montey Hora Signed: 12/01/2017 6:32:25 PM By: Worthy Keeler PA-C Previous Signature: 11/25/2017 1:59:17 AM Version By: Worthy Keeler PA-C Previous Signature: 11/28/2017 8:01:40 AM Version By: Christin Fudge MD, FACS Entered By: Montey Hora on 11/28/2017 11:57:46 Stat[place], [name] ([id number]) -------------------------------------------------------------------------------- Physician Orders Details Patient Name: Frazier [name] Date of Service: 11/23/2017 12:30 PM Medical Record Number: [id number] Patient Account Number: [id number] Date of Birth/Sex: 1923/10/30 (83 y.o. 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 o Cleanse wound with mild soap and water o May Shower, gently pat wound dry prior to applying new dressing. Anesthetic (add to Medication List) Wound #1 Right,Lateral Malleolus o Topical Lidocaine 4% cream applied to wound bed prior to debridement (In Clinic Only). Skin Barriers/Peri-Wound Care Wound #1 Right,Lateral Malleolus o Skin Prep o Antifungal cream o Triamcinolone Acetonide Ointment Primary Wound Dressing Wound #1 Right,Lateral Malleolus o Silvercel Non-Adherent Secondary Dressing Wound #1 Right,Lateral Malleolus o Dry Gauze o Conform/Kerlix Dressing Change Frequency Wound #1 Right,Lateral Malleolus o Change dressing every other day. Follow-up Appointments o Return Appointment in 1 week. Edema  Control Wound #1 Right,Lateral Malleolus Mifsud, [name] ([id number]) o Patient to wear own compression stockings o Patient to wear own Juxtalite/Juzo compression garment. o Elevate legs to the level of the heart and pump ankles as often as possible Off-Loading Wound #1 Right,Lateral Malleolus o Turn and reposition every 2 hours o Other: - do not put pressure on your right ankle Additional Orders / Instructions Wound #1 Right,Lateral Malleolus o Increase protein intake. o Other: - Add Vitamin C, Zinc, Vitamin A to your diet Electronic Signature(s) Signed: 11/28/2017 4:52:08 PM By: Montey Hora Signed: 12/01/2017 6:32:25 PM By: Worthy Keeler PA-C Previous Signature: 11/23/2017 4:38:31 PM Version By: Montey Hora Previous Signature: 11/28/2017 8:01:40 AM Version By: Christin Fudge MD, FACS Entered By: Montey Hora on 11/28/2017 11:56:09 Jaw[place], [name] ([id number]) -------------------------------------------------------------------------------- Problem List Details  Patient Name: [name], [name]. Date of Service: 11/23/2017 12:30 PM Medical Record Number: [id number] Patient Account Number: [id number] Date of Birth/Sex: October 07, 1923 (83 y.o. Female) Treating RN: Ahmed Prima Primary Care Provider: FITZGERALD, DAVID Other Clinician: Referring Provider: FITZGERALD, DAVID Treating Provider/Extender: STONE III, HOYT Weeks in Treatment: 15 Active Problems ICD-10 Encounter Code Description Active Date Diagnosis L97.312 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d 08/10/2017 Yes I70.233 Atherosclerosis of native arteries of right leg with ulceration of ankle 08/10/2017 Yes I89.0 Lymphedema, not elsewhere classified 08/10/2017 Yes B35.4 Tinea corporis 09/28/2017 Yes Inactive Problems Resolved Problems Electronic Signature(s) Signed: 11/28/2017 11:56:38 AM By: Montey Hora Signed: 12/01/2017 6:32:25 PM By: Worthy Keeler PA-C Previous  Signature: 11/25/2017 1:59:17 AM Version By: Worthy Keeler PA-C Previous Signature: 11/28/2017 8:01:40 AM Version By: Christin Fudge MD, FACS Entered By: Montey Hora on 11/28/2017 11:56:37 Heidrich, [name] ([id number]) -------------------------------------------------------------------------------- Progress Note Details Patient Name: Frazier [name] Date of Service: 11/23/2017 12:30 PM Medical Record Number: [id number] Patient Account Number: [id number] Date of Birth/Sex: 1923/10/26 (83 y.o. Female) Treating RN: Ahmed Prima Primary Care Provider: FITZGERALD, DAVID Other Clinician: Referring Provider: FITZGERALD, DAVID Treating Provider/Extender: STONE III, HOYT Weeks in Treatment: 15 Subjective Chief Complaint Information obtained from Patient Patient presents to the wound care center for a consult due non healing wound to the right lateral malleolus History of Present Illness (HPI) The following HPI elements were documented for the patient's wound: Location: Patient presents with an ulcer on the right lateral malleolus Quality: Patient reports experiencing a dull pain to affected area(s). Severity: Patient states wound are getting worse. Duration: Patient has had the wound for >2 years prior to seeking treatment at the wound center Timing: Pain in wound is constant (hurts all the time) Context: The wound would happen gradually Modifying Factors: Other treatment(s) tried include:seen a podiatrist on a vascular surgeons and under treatment with them Associated Signs and Symptoms: Patient reports having increase swelling. 83 year old patient who most recently has been seeing both podiatry and vascular surgery for a long-standing ulcer of her right lateral malleolus which has been treated with various methodologies. Dr. Amalia Hailey the podiatrist saw her on 07/20/2017 and sent her to the wound center for possible hyperbaric oxygen therapy. past medical history of peripheral  vascular disease, varicose veins, status post appendectomy, basal cell carcinoma excision from the left leg, cholecystectomy, pacemaker placement, right lower extremity angiography done by Dr. dew in March 2017 with placement of a stent. there is also note of a successful ablation of the right small saphenous vein done which was reviewed by ultrasound on 10/24/2016. the patient had a right small saphenous vein ablation done on 10/20/2016. The patient has never been a smoker. She has been seen by Dr. Corene Cornea dew the vascular surgeon who most recently saw her on 06/15/2017 for evaluation of ongoing problems with right leg swelling. She had a lower extremity arterial duplex examination done(02/13/17) which showed patent distal right superficial femoral artery stent and above-the-knee popliteal stent without evidence of restenosis. The ABI was more than 1.3 on the right and more than 1.3 on the left. This was consistent with noncompressible arteries due to medial calcification. The right great toe pressure and PPG waveforms are within normal limits and the left great toe pressure and PPG waveforms are decreased. he recommended she continue to wear her compression stockings and continue with elevation. She is scheduled to have a noninvasive arterial study in the near future 08/16/2017 -- had a lower extremity  arterial duplex examination done which showed patent distal right superficial femoral artery stent and above-the-knee popliteal stent without evidence of restenosis. The ABI was more than 1.3 on the right and more than 1.3 on the left. This was consistent with noncompressible arteries due to medial calcification. The right great toe pressure and PPG waveforms are within normal limits and the left great toe pressure and PPG waveforms are decreased. the x-ray of the right ankle has not yet been done 08/24/2017 -- had a right ankle x-ray -- IMPRESSION:1. No fracture, bone lesion or evidence of  osteomyelitis. 2. Lateral soft tissue swelling with a soft tissue ulcer. she has not yet seen the vascular surgeon for review 08/31/17 on evaluation today patient's wound appears to be showing signs of improvement. She still with her appointment with vascular in order to review her results of her vascular study and then determine if any intervention would be recommended at that time. No fevers, chills, nausea, or vomiting noted at this time. She has been tolerating the dressing changes without REAH, [name] ([id number]) complication. 09/28/17 on evaluation today patient's wound appears to show signs of good improvement in regard to the granulation tissue which is surfacing. There is still a layer of slough covering the wound and the posterior portion is still significantly deeper than the anterior nonetheless there has been some good sign of things moving towards the better. She is going to go back to Dr. dew for reevaluation to ensure her blood flow is still appropriate. That will be before her next evaluation with [place] next week. No fevers, chills, nausea, or vomiting noted at this time. Patient does have some discomfort rated to be a 3-4/10 depending on activity specifically cleansing the wound makes it worse. 10/05/2017 -- the patient was seen by Dr. Lucky Cowboy last week and noninvasive studies showed a normal right ABI with brisk triphasic waveforms consistent with no arterial insufficiency including normal digital pressures. The duplex showed a patent distal right SFA stent and the proximal SFA was also normal. He was pleased with her test and thought she should have enough of perfusion for normal wound healing. He would see her back in 6 months time. 11/23/17 patient's wound appears to be doing excellent today on evaluation. It is filling in nicely with granulation tissue and there is only a small amount of slough over the surface of the wound. Overall she and I both are extremely pleased with  the progress that she has made. No fevers, chills, nausea, or vomiting noted at this time. Patient History Information obtained from Patient. Family History Cancer - Father,Siblings, Heart Disease - Siblings, No family history of Diabetes, Hereditary Spherocytosis, Hypertension, Kidney Disease, Lung Disease, Seizures, Stroke, Thyroid Problems, Tuberculosis. Social History Never smoker, Marital Status - Married, Alcohol Use - Never, Drug Use - No History, Caffeine Use - Rarely. Review of Systems (ROS) Constitutional Symptoms (General Health) Denies complaints or symptoms of Fever, Chills. Respiratory The patient has no complaints or symptoms. Cardiovascular Complains or has symptoms of LE edema. Psychiatric The patient has no complaints or symptoms. Objective Constitutional Well-nourished and well-hydrated in no acute distress. Vitals Time Taken: 12:50 PM, Height: 65 in, Weight: 154.3 lbs, BMI: 25.7, Temperature: 98.0 F, Pulse: 85 bpm, Respiratory Rate: 16 breaths/min, Blood Pressure: 127/70 mmHg. Respiratory normal breathing without difficulty. ARNESIA, VINCELETTE ([id number]) Psychiatric this patient is able to make decisions and demonstrates good insight into disease process. Alert and Oriented x 3. pleasant and cooperative. General Notes: There does not  appear to be any evidence of infection noted on examination today. The slough that was covering the wound was sharply debride it today using a curette and patient tolerated this well without complication. Integumentary (Hair, Skin) Wound #1 status is Open. Original cause of wound was Gradually Appeared. The wound is located on the Right,Lateral Malleolus. The wound measures 0.9cm length x 1.2cm width x 0.2cm depth; 0.848cm^2 area and 0.17cm^3 volume. There is Fat Layer (Subcutaneous Tissue) Exposed exposed. There is no tunneling or undermining noted. There is a large amount of serous drainage noted. The wound margin is distinct  with the outline attached to the wound base. There is medium (34-66%) red granulation within the wound bed. There is a medium (34-66%) amount of necrotic tissue within the wound bed including Adherent Slough. The periwound skin appearance exhibited: Maceration, Erythema. The surrounding wound skin color is noted with erythema which is circumferential. Periwound temperature was noted as No Abnormality. The periwound has tenderness on palpation. Assessment Active Problems ICD-10 L97.312 - 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d I70.233 - Atherosclerosis of native arteries of right leg with ulceration of ankle I89.0 - Lymphedema, not elsewhere classified B35.4 - Tinea corporis Procedures Wound #1 Pre-procedure diagnosis of Wound #1 is a Lymphedema located on the Right,Lateral Malleolus .Severity of Tissue Pre Debridement is: Fat layer exposed. There was a Skin/Subcutaneous Tissue Debridement ([id number]) debridement with total area of 1.08 sq cm performed by STONE III, HOYT E., PA-C. with the following instrument(s): Curette to remove Viable and Non-Viable tissue/material including Fibrin/Slough and Subcutaneous after achieving pain control using Lidocaine 4% Topical Solution. A time out was conducted at 13:03, prior to the start of the procedure. A Minimum amount of bleeding was controlled with Pressure. The procedure was tolerated well with a pain level of 0 throughout and a pain level of 0 following the procedure. Post Debridement Measurements: 0.9cm length x 1.2cm width x 0.3cm depth; 0.254cm^3 volume. Character of Wound/Ulcer Post Debridement is improved. Severity of Tissue Post Debridement is: Fat layer exposed. Post procedure Diagnosis Wound #1: Same as Pre-Procedure Plan Wound Cleansing: Angelisse, Riso [name] ([id number]) Wound #1 Right,Lateral Malleolus: Clean wound with Normal Saline. Cleanse wound with mild soap and water May Shower, gently pat wound dry  prior to applying new dressing. Anesthetic (add to Medication List): Wound #1 Right,Lateral Malleolus: Topical Lidocaine 4% cream applied to wound bed prior to debridement (In Clinic Only). Skin Barriers/Peri-Wound Care: Wound #1 Right,Lateral Malleolus: Skin Prep Antifungal cream Triamcinolone Acetonide Ointment Primary Wound Dressing: Wound #1 Right,Lateral Malleolus: Silvercel Non-Adherent Secondary Dressing: Wound #1 Right,Lateral Malleolus: Dry Gauze Conform/Kerlix Dressing Change Frequency: Wound #1 Right,Lateral Malleolus: Change dressing every other day. Follow-up Appointments: Return Appointment in 1 week. Edema Control: Wound #1 Right,Lateral Malleolus: Patient to wear own compression stockings Patient to wear own Juxtalite/Juzo compression garment. Elevate legs to the level of the heart and pump ankles as often as possible Off-Loading: Wound #1 Right,Lateral Malleolus: Turn and reposition every 2 hours Other: - do not put pressure on your right ankle Additional Orders / Instructions: Wound #1 Right,Lateral Malleolus: Increase protein intake. Other: - Add Vitamin C, Zinc, Vitamin A to your diet At this point I'm gonna recommend that we continue with the Current wound care measures for the next week. Hopefully patient will continue to show signs of good improvement week by week. Please see above for specific wound care orders. We will see patient for re-evaluation in 1 week(s) here in the clinic. If anything  worsens or changes patient will contact our office for additional recommendations. Electronic Signature(s) Signed: 11/28/2017 11:58:08 AM By: Montey Hora Signed: 12/01/2017 6:32:25 PM By: Worthy Keeler PA-C Previous Signature: 11/25/2017 1:59:17 AM Version By: Worthy Keeler PA-C Previous Signature: 11/28/2017 8:01:40 AM Version By: Christin Fudge MD, FACS Entered By: Montey Hora on 11/28/2017 11:58:08 [name], [name]  ([id number]) -------------------------------------------------------------------------------- ROS/PFSH Details Patient Name: Frazier [name] Date of Service: 11/23/2017 12:30 PM Medical Record Number: [id number] Patient Account Number: [id number] Date of Birth/Sex: 04-11-1923 (83 y.o. Female) Treating RN: Ahmed Prima Primary Care Provider: FITZGERALD, DAVID Other Clinician: Referring Provider: FITZGERALD, DAVID Treating Provider/Extender: STONE III, HOYT Weeks in Treatment: 15 Information Obtained From Patient Wound History Do you currently have one or more open woundso Yes How many open wounds do you currently haveo 1 Approximately how long have you had your woundso 2 yrs How have you been treating your wound(s) until nowo mupirocin, soaking in epsom salt Has your wound(s) ever healed and then re-openedo No Have you had any lab work done in the past montho No Have you tested positive for an antibiotic resistant organism (MRSA, VRE)o No Have you tested positive for osteomyelitis (bone infection)o No Have you had any tests for circulation on your legso Yes Who ordered the testo Dr. Lucky Cowboy [facility] Constitutional Symptoms (General Health) Complaints and Symptoms: Negative for: Fever; Chills Cardiovascular Complaints and Symptoms: Positive for: LE edema Medical History: Positive for: Congestive Heart Failure; Hypertension Eyes Medical History: Positive for: Cataracts - surgery Respiratory Complaints and Symptoms: No Complaints or Symptoms Musculoskeletal Medical History: Positive for: Osteoarthritis Neurologic Medical History: Positive for: Neuropathy Oncologic BRYLIE, SNEATH ([id number]) Medical History: Negative for: Received Chemotherapy; Received Radiation Psychiatric Complaints and Symptoms: No Complaints or Symptoms HBO Extended History Items Eyes: Cataracts Immunizations Pneumococcal Vaccine: Received Pneumococcal Vaccination:  Yes Implantable Devices Family and Social History Cancer: Yes - Father,Siblings; Diabetes: No; Heart Disease: Yes - Siblings; Hereditary Spherocytosis: No; Hypertension: No; Kidney Disease: No; Lung Disease: No; Seizures: No; Stroke: No; Thyroid Problems: No; Tuberculosis: No; Never smoker; Marital Status - Married; Alcohol Use: Never; Drug Use: No History; Caffeine Use: Rarely; Financial Concerns: No; Food, Clothing or Shelter Needs: No; Support System Lacking: No; Transportation Concerns: No; Advanced Directives: No; Patient does not want information on Advanced Directives; Do not resuscitate: No; Living Will: Yes (Not Provided); Medical Power of Attorney: No Physician Affirmation I have reviewed and agree with the above information. Electronic Signature(s) Signed: 11/28/2017 4:38:32 PM By: Alric Quan Signed: 11/28/2017 4:52:08 PM By: Montey Hora Signed: 12/01/2017 6:32:25 PM By: Worthy Keeler PA-C Previous Signature: 11/23/2017 1:37:28 PM Version By: Alric Quan Previous Signature: 11/25/2017 1:59:17 AM Version By: Worthy Keeler PA-C Previous Signature: 11/28/2017 8:01:40 AM Version By: Christin Fudge MD, FACS Entered By: Montey Hora on 11/28/2017 11:57:35 Hinsch, [name] ([id number]) -------------------------------------------------------------------------------- SuperBill Details Patient Name: DONNAMAE, MUILENBURG Date of Service: 11/23/2017 Medical Record Number: [id number] Patient Account Number: [id number] Date of Birth/Sex: May 15, 1923 (83 y.o. 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 I89.0  Lymphedema, not elsewhere classified B35.4 Tinea corporis Facility Procedures CPT4 Code: 33295188 Description: 41660 - DEB SUBQ TISSUE 20 SQ CM/< ICD-10 Diagnosis Description L97.312 Non-pressure  chronic ulcer of right ankle with fat layer ex Modifier: posed Quantity: 1 Physician Procedures CPT4 Code: 0413643 Description: 83779 - WC PHYS SUBQ TISS 20 SQ CM ICD-10 Diagnosis Description Z96.886 Non-pressure chronic ulcer of right ankle with fat layer ex Modifier: posed Quantity: 1 Electronic Signature(s) Signed: 11/28/2017 11:56:24 AM By: Montey Hora Signed: 12/01/2017 6:32:25 PM By: Worthy Keeler PA-C Previous Signature: 11/25/2017 1:59:17 AM Version By: Worthy Keeler PA-C Previous Signature: 11/28/2017 8:01:40 AM Version By: Christin Fudge MD, FACS Entered By: Montey Hora on 11/28/2017 11:56:23

## 2017-11-30 ENCOUNTER — Encounter: Payer: Medicare Other | Admitting: Surgery

## 2017-11-30 DIAGNOSIS — I70233 Atherosclerosis of native arteries of right leg with ulceration of ankle: Secondary | ICD-10-CM | POA: Diagnosis not present

## 2017-12-01 NOTE — Progress Notes (Signed)
[name], BROADEN ([id number]) Visit Report for 11/30/2017 Arrival Information Details Patient Name: [name], WAIGHT Date of Service: 11/30/2017 12:30 PM Medical Record Number: [id number] Patient Account Number: [id number] Date of Birth/Sex: [birth year] (83 y.o. Female) Treating RN: Cornell Barman Primary Care Aren Cherne: FITZGERALD, DAVID Other Clinician: Referring Neev Mcmains: FITZGERALD, DAVID Treating Meridith Romick/Extender: Frann Rider in Treatment: 16 Visit Information History Since Last Visit Added or deleted any medications: No Patient Arrived: Ambulatory Any new allergies or adverse reactions: No Arrival Time: 12:45 Had a fall or experienced change in No Accompanied By: daughter activities of daily living that may affect Transfer Assistance: None risk of falls: Patient Identification Verified: Yes Signs or symptoms of abuse/neglect since last visito No Secondary Verification Process Completed: Yes Hospitalized since last visit: No Patient Requires Transmission-Based No Has Dressing in Place as Prescribed: Yes Precautions: Pain Present Now: No Patient Has Alerts: No Electronic Signature(s) Signed: 11/30/2017 3:11:53 PM By: Lorine Bears RCP, RRT, CHT Entered By: Lorine Bears on 11/30/2017 12:46:22 Heinbaugh, [name] ([id number]) -------------------------------------------------------------------------------- Clinic Level of Care Assessment Details Patient Name: [name] Date of Service: 11/30/2017 12:30 PM Medical Record Number: [id number] Patient Account Number: [id number] Date of Birth/Sex: Jun 15, 1923 (83 y.o. Female) Treating RN: Cornell Barman Primary Care Jori Frerichs: FITZGERALD, DAVID Other Clinician: Referring Billyjoe Go: FITZGERALD, DAVID Treating Jade Burkard/Extender: Frann Rider in Treatment: 16 Clinic Level of Care Assessment Items TOOL 4 Quantity Score []  - Use when only an EandM is performed on FOLLOW-UP visit  0 ASSESSMENTS - Nursing Assessment / Reassessment []  - Reassessment of Co-morbidities (includes updates in patient status) 0 X- 1 5 Reassessment of Adherence to Treatment Plan ASSESSMENTS - Wound and Skin Assessment / Reassessment X - Simple Wound Assessment / Reassessment - one wound 1 5 []  - 0 Complex Wound Assessment / Reassessment - multiple wounds []  - 0 Dermatologic / Skin Assessment (not related to wound area) ASSESSMENTS - Focused Assessment []  - Circumferential Edema Measurements - multi extremities 0 []  - 0 Nutritional Assessment / Counseling / Intervention []  - 0 Lower Extremity Assessment (monofilament, tuning fork, pulses) []  - 0 Peripheral Arterial Disease Assessment (using hand held doppler) ASSESSMENTS - Ostomy and/or Continence Assessment and Care []  - Incontinence Assessment and Management 0 []  - 0 Ostomy Care Assessment and Management (repouching, etc.) PROCESS - Coordination of Care X - Simple Patient / Family Education for ongoing care 1 15 []  - 0 Complex (extensive) Patient / Family Education for ongoing care []  - 0 Staff obtains Programmer, systems, Records, Test Results / Process Orders []  - 0 Staff telephones HHA, Nursing Homes / Clarify orders / etc []  - 0 Routine Transfer to another Facility (non-emergent condition) []  - 0 Routine Hospital Admission (non-emergent condition) X- 1 15 New Admissions / Biomedical engineer / Ordering NPWT, Apligraf, etc. []  - 0 Emergency Hospital Admission (emergent condition) X- 1 10 Simple Discharge Coordination HARLEA, GOETZINGER ([id number]) []  - 0 Complex (extensive) Discharge Coordination PROCESS - Special Needs []  - Pediatric / Minor Patient Management 0 []  - 0 Isolation Patient Management []  - 0 Hearing / Language / Visual special needs []  - 0 Assessment of Community assistance (transportation, D/C planning, etc.) []  - 0 Additional assistance / Altered mentation []  - 0 Support Surface(s) Assessment (bed,  cushion, seat, etc.) INTERVENTIONS - Wound Cleansing / Measurement X - Simple Wound Cleansing - one wound 1 5 []  - 0 Complex Wound Cleansing - multiple wounds X- 1 5 Wound Imaging (photographs - any number of wounds) []  -  0 Wound Tracing (instead of photographs) X- 1 5 Simple Wound Measurement - one wound []  - 0 Complex Wound Measurement - multiple wounds INTERVENTIONS - Wound Dressings []  - Small Wound Dressing one or multiple wounds 0 X- 1 15 Medium Wound Dressing one or multiple wounds []  - 0 Large Wound Dressing one or multiple wounds []  - 0 Application of Medications - topical []  - 0 Application of Medications - injection INTERVENTIONS - Miscellaneous []  - External ear exam 0 []  - 0 Specimen Collection (cultures, biopsies, blood, body fluids, etc.) []  - 0 Specimen(s) / Culture(s) sent or taken to Lab for analysis []  - 0 Patient Transfer (multiple staff / Civil Service fast streamer / Similar devices) []  - 0 Simple Staple / Suture removal (25 or less) []  - 0 Complex Staple / Suture removal (26 or more) []  - 0 Hypo / Hyperglycemic Management (close monitor of Blood Glucose) []  - 0 Ankle / Brachial Index (ABI) - do not check if billed separately X- 1 5 Vital Signs Makar, [name] ([id number]) Has the patient been seen at the hospital within the last three years: Yes Total Score: 85 Level Of Care: New/Established - Level 3 Electronic Signature(s) Signed: 11/30/2017 5:14:22 PM By: Gretta Cool, BSN, RN, CWS, Kim RN, BSN Entered By: Gretta Cool, BSN, RN, CWS, Kim on 11/30/2017 13:13:17 Jeffords, [name] ([id number]) -------------------------------------------------------------------------------- Encounter Discharge Information Details Patient Name: [name] Date of Service: 11/30/2017 12:30 PM Medical Record Number: [id number] Patient Account Number: [id number] Date of Birth/Sex: 07/09/1923 (83 y.o. Female) Treating RN: Cornell Barman Primary Care Robet Crutchfield: FITZGERALD, DAVID Other  Clinician: Referring Davieon Stockham: FITZGERALD, DAVID Treating Inez Stantz/Extender: Frann Rider in Treatment: 19 Encounter Discharge Information Items Discharge Pain Level: 0 Discharge Condition: Stable Ambulatory Status: Ambulatory Discharge Destination: Home Transportation: Private Auto Accompanied By: daughter Schedule Follow-up Appointment: Yes Medication Reconciliation completed and Yes provided to Patient/Care Lossie Kalp: Provided on Clinical Summary of Care: 11/30/2017 Form Type Recipient Paper Patient WS Electronic Signature(s) Signed: 11/30/2017 5:14:22 PM By: Gretta Cool, BSN, RN, CWS, Kim RN, BSN Entered By: Gretta Cool, BSN, RN, CWS, Kim on 11/30/2017 13:17:39 Gedney, [name] ([id number]) -------------------------------------------------------------------------------- Lower Extremity Assessment Details Patient Name: [name] Date of Service: 11/30/2017 12:30 PM Medical Record Number: [id number] Patient Account Number: [id number] Date of Birth/Sex: [birth year] (83 y.o. Female) Treating RN: Cornell Barman Primary Care Kery Haltiwanger: FITZGERALD, DAVID Other Clinician: Referring Shirlene Andaya: FITZGERALD, DAVID Treating Minna Dumire/Extender: Frann Rider in Treatment: 16 Edema Assessment Assessed: [Left: No] [Right: No] [Left: Edema] [Right: :] Calf Left: Right: Point of Measurement: 35 cm From Medial Instep cm 32.5 cm Ankle Left: Right: Point of Measurement: 12 cm From Medial Instep cm 21.5 cm Vascular Assessment Pulses: Dorsalis Pedis Palpable: [Right:Yes] Posterior Tibial Extremity colors, hair growth, and conditions: Extremity Color: [Right:Red] Hair Growth on Extremity: [Right:No] Temperature of Extremity: [Right:Warm] Capillary Refill: [Right:< 3 seconds] Toe Nail Assessment Left: Right: Thick: Yes Discolored: Yes Deformed: Yes Improper Length and Hygiene: No Electronic Signature(s) Signed: 11/30/2017 5:14:22 PM By: Gretta Cool, BSN, RN, CWS, Kim RN,  BSN Entered By: Gretta Cool, BSN, RN, CWS, Kim on 11/30/2017 13:01:29 Audino, [name] ([id number]) -------------------------------------------------------------------------------- Multi Wound Chart Details Patient Name: [name] Date of Service: 11/30/2017 12:30 PM Medical Record Number: [id number] Patient Account Number: [id number] Date of Birth/Sex: Feb 10, 1923 (83 y.o. Female) Treating RN: Cornell Barman Primary Care Nahia Nissan: FITZGERALD, DAVID Other Clinician: Referring Ronnel Zuercher: FITZGERALD, DAVID Treating Jalyne Brodzinski/Extender: Frann Rider in Treatment: 16 Vital Signs Height(in): 65 Pulse(bpm): 87 Weight(lbs): 154.3 Blood Pressure(mmHg): 138/70 Body Mass Index(BMI):  26 Temperature(F): 98.1 Respiratory Rate 16 (breaths/min): Photos: [1:No Photos] [N/A:N/A] Wound Location: [1:Right Malleolus - Lateral] [N/A:N/A] Wounding Event: [1:Gradually Appeared] [N/A:N/A] Primary Etiology: [1:Lymphedema] [N/A:N/A] Secondary Etiology: [1:Arterial Insufficiency Ulcer] [N/A:N/A] Comorbid History: [1:Cataracts, Congestive Heart Failure, Hypertension, Osteoarthritis, Neuropathy] [N/A:N/A] Date Acquired: [1:08/11/2015] [N/A:N/A] Weeks of Treatment: [1:16] [N/A:N/A] Wound Status: [1:Open] [N/A:N/A] Measurements L x W x D [1:1.1x1.2x0.1] [N/A:N/A] (cm) Area (cm) : [1:1.037] [N/A:N/A] Volume (cm) : [1:0.104] [N/A:N/A] % Reduction in Area: [1:32.30%] [N/A:N/A] % Reduction in Volume: [1:77.30%] [N/A:N/A] Classification: [1:Full Thickness Without Exposed Support Structures] [N/A:N/A] Exudate Amount: [1:Large] [N/A:N/A] Exudate Type: [1:Serous] [N/A:N/A] Exudate Color: [1:amber] [N/A:N/A] Wound Margin: [1:Distinct, outline attached] [N/A:N/A] Granulation Amount: [1:Small (1-33%)] [N/A:N/A] Granulation Quality: [1:Red] [N/A:N/A] Necrotic Amount: [1:Large (67-100%)] [N/A:N/A] Exposed Structures: [1:Fat Layer (Subcutaneous Tissue) Exposed: Yes] [N/A:N/A] Epithelialization: [1:None]  [N/A:N/A] Periwound Skin Texture: [1:Excoriation: No Induration: No Callus: No Crepitus: No Rash: No Scarring: No] [N/A:N/A] Periwound Skin Moisture: [1:Dry/Scaly: Yes Maceration: No] [N/A:N/A] Periwound Skin Color: Atrophie Blanche: No N/A N/A Cyanosis: No Ecchymosis: No Erythema: No Hemosiderin Staining: No Mottled: No Pallor: No Rubor: No Temperature: No Abnormality N/A N/A Tenderness on Palpation: Yes N/A N/A Wound Preparation: Ulcer Cleansing: N/A N/A Rinsed/Irrigated with Saline Topical Anesthetic Applied: Other: lidocaine 4% Treatment Notes Wound #1 (Right, Lateral Malleolus) 1. Cleansed with: Clean wound with Normal Saline 2. Anesthetic Topical Lidocaine 4% cream to wound bed prior to debridement 4. Dressing Applied: Other dressing (specify in notes) 5. Secondary Dressing Applied Gauze and Kerlix/Conform 7. Secured with Tape Notes silvercel, Nystatin, Juxtalite Electronic Signature(s) Signed: 11/30/2017 1:21:34 PM By: Christin Fudge MD, FACS Entered By: Christin Fudge on 11/30/2017 13:21:34 Whack, [name] ([id number]) -------------------------------------------------------------------------------- [place] Details Patient Name: [name] Date of Service: 11/30/2017 12:30 PM Medical Record Number: [id number] Patient Account Number: [id number] Date of Birth/Sex: 03/18/1923 (83 y.o. Female) Treating RN: Cornell Barman Primary Care Chastin Riesgo: FITZGERALD, DAVID Other Clinician: Referring Kamarii Carton: FITZGERALD, DAVID Treating Daysie Helf/Extender: Frann Rider in Treatment: 16 Active Inactive ` Abuse / Safety / Falls / Self Care Management Nursing Diagnoses: Potential for falls Goals: Patient will not experience any injury related to falls Date Initiated: 08/10/2017 Target Resolution Date: 11/10/2017 Goal Status: Active Interventions: Assess Activities of Daily Living upon admission and as needed Assess fall risk on admission  and as needed Assess: immobility, friction, shearing, incontinence upon admission and as needed Notes: ` Nutrition Nursing Diagnoses: Imbalanced nutrition Potential for alteratiion in Nutrition/Potential for imbalanced nutrition Goals: Patient/caregiver agrees to and verbalizes understanding of need to use nutritional supplements and/or vitamins as prescribed Date Initiated: 08/10/2017 Target Resolution Date: 12/08/2017 Goal Status: Active Interventions: Assess patient nutrition upon admission and as needed per policy Notes: ` Orientation to the Wound Care Program Nursing Diagnoses: Knowledge deficit related to the wound healing center program Goals: Patient/caregiver will verbalize understanding of the [facility] Date Initiated: 08/10/2017 Target Resolution Date: 09/08/2017 KAIRIE, VANGIESON ([id number]) Goal Status: Active Interventions: Provide education on orientation to the wound center Notes: ` Pain, Acute or Chronic Nursing Diagnoses: Pain, acute or chronic: actual or potential Potential alteration in comfort, pain Goals: Patient/caregiver will verbalize adequate pain control between visits Date Initiated: 08/10/2017 Target Resolution Date: 12/08/2017 Goal Status: Active Interventions: Complete pain assessment as per visit requirements Notes: ` Wound/Skin Impairment Nursing Diagnoses: Impaired tissue integrity Knowledge deficit related to ulceration/compromised skin integrity Goals: Ulcer/skin breakdown will have a volume reduction of 80% by week 12 Date Initiated: 08/10/2017 Target Resolution Date: 12/01/2017 Goal Status: Active Interventions: Assess  patient/caregiver ability to perform ulcer/skin care regimen upon admission and as needed Notes: Electronic Signature(s) Signed: 11/30/2017 5:14:22 PM By: Gretta Cool, BSN, RN, CWS, Kim RN, BSN Entered By: Gretta Cool, BSN, RN, CWS, Kim on 11/30/2017 13:01:51 Altier, [name]  ([id number]) -------------------------------------------------------------------------------- Pain Assessment Details Patient Name: [name] Date of Service: 11/30/2017 12:30 PM Medical Record Number: [id number] Patient Account Number: [id number] Date of Birth/Sex: 02-24-1923 (83 y.o. Female) Treating RN: Cornell Barman Primary Care Chaney Ingram: FITZGERALD, DAVID Other Clinician: Referring Christinia Lambeth: FITZGERALD, DAVID Treating Liel Rudden/Extender: Frann Rider in Treatment: 16 Active Problems Location of Pain Severity and Description of Pain Patient Has Paino No Site Locations Pain Management and Medication Current Pain Management: Electronic Signature(s) Signed: 11/30/2017 3:11:53 PM By: Lorine Bears RCP, RRT, CHT Signed: 11/30/2017 5:14:22 PM By: Gretta Cool, BSN, RN, CWS, Kim RN, BSN Entered By: Lorine Bears on 11/30/2017 12:46:30 Nicotra, [name] ([id number]) -------------------------------------------------------------------------------- Patient/Caregiver Education Details Patient Name: [name] Date of Service: 11/30/2017 12:30 PM Medical Record Number: [id number] Patient Account Number: [id number] Date of Birth/Gender: 1923-04-09 (83 y.o. Female) Treating RN: Cornell Barman Primary Care Physician: FITZGERALD, DAVID Other Clinician: Referring Physician: FITZGERALD, DAVID Treating Physician/Extender: Frann Rider in Treatment: 16 Education Assessment Education Provided To: Patient Education Topics Provided Wound/Skin Impairment: Handouts: Caring for Your Ulcer Methods: Demonstration, Explain/Verbal Responses: State content correctly Electronic Signature(s) Signed: 11/30/2017 5:14:22 PM By: Gretta Cool, BSN, RN, CWS, Kim RN, BSN Entered By: Gretta Cool, BSN, RN, CWS, Kim on 11/30/2017 13:17:50 Mischke, [name] ([id number]) -------------------------------------------------------------------------------- Wound Assessment  Details Patient Name: [name] Date of Service: 11/30/2017 12:30 PM Medical Record Number: [id number] Patient Account Number: [id number] Date of Birth/Sex: [birth year] (83 y.o. Female) Treating RN: Cornell Barman Primary Care Kashmere Daywalt: FITZGERALD, DAVID Other Clinician: Referring Dreamer Carillo: FITZGERALD, DAVID Treating Amaziah Raisanen/Extender: Frann Rider in Treatment: 16 Wound Status Wound Number: 1 Primary Lymphedema Etiology: Wound Location: Right Malleolus - Lateral Secondary Arterial Insufficiency Ulcer Wounding Event: Gradually Appeared Etiology: Date Acquired: 08/11/2015 Wound Status: Open Weeks Of Treatment: 16 Comorbid Cataracts, Congestive Heart Failure, Clustered Wound: No History: Hypertension, Osteoarthritis, Neuropathy Wound Measurements Length: (cm) 1.1 Width: (cm) 1.2 Depth: (cm) 0.1 Area: (cm) 1.037 Volume: (cm) 0.104 % Reduction in Area: 32.3% % Reduction in Volume: 77.3% Epithelialization: None Tunneling: No Undermining: No Wound Description Full Thickness Without Exposed Support Foul Od Classification: Structures Slough/ Wound Margin: Distinct, outline attached Exudate Large Amount: Exudate Type: Serous Exudate Color: amber or After Cleansing: No Fibrino Yes Wound Bed Granulation Amount: Small (1-33%) Exposed Structure Granulation Quality: Red Fat Layer (Subcutaneous Tissue) Exposed: Yes Necrotic Amount: Large (67-100%) Necrotic Quality: Adherent Slough Periwound Skin Texture Texture Color No Abnormalities Noted: No No Abnormalities Noted: No Callus: No Atrophie Blanche: No Crepitus: No Cyanosis: No Excoriation: No Ecchymosis: No Induration: No Erythema: No Rash: No Hemosiderin Staining: No Scarring: No Mottled: No Pallor: No Moisture Rubor: No No Abnormalities Noted: No Dry / Scaly: Yes Temperature / Pain Maceration: No Temperature: No Abnormality Tenderness on Palpation: Yes Golonka, [name] ([id number]) Wound  Preparation Ulcer Cleansing: Rinsed/Irrigated with Saline Topical Anesthetic Applied: Other: lidocaine 4%, Treatment Notes Wound #1 (Right, Lateral Malleolus) 1. Cleansed with: Clean wound with Normal Saline 2. Anesthetic Topical Lidocaine 4% cream to wound bed prior to debridement 4. Dressing Applied: Other dressing (specify in notes) 5. Secondary Dressing Applied Gauze and Kerlix/Conform 7. Secured with Tape Notes silvercel, Nystatin, Juxtalite Electronic Signature(s) Signed: 11/30/2017 5:14:22 PM By: Gretta Cool, BSN, RN, CWS, Kim RN, BSN Entered By: Gretta Cool, BSN, RN, CWS,  Kim on 11/30/2017 12:57:03 Soltero, [name] ([id number]) -------------------------------------------------------------------------------- [place] Details Patient Name: ALIRA, FRETWELL Date of Service: 11/30/2017 12:30 PM Medical Record Number: [id number] Patient Account Number: [id number] Date of Birth/Sex: 10/22/1923 (83 y.o. Female) Treating RN: Cornell Barman Primary Care Aquila Delaughter: FITZGERALD, DAVID Other Clinician: Referring Rayford Williamsen: FITZGERALD, DAVID Treating Audi Wettstein/Extender: Frann Rider in Treatment: 16 Vital Signs Time Taken: 12:45 Temperature (F): 98.1 Height (in): 65 Pulse (bpm): 87 Weight (lbs): 154.3 Respiratory Rate (breaths/min): 16 Body Mass Index (BMI): 25.7 Blood Pressure (mmHg): 138/70 Reference Range: 80 - 120 mg / dl Electronic Signature(s) Signed: 11/30/2017 3:11:53 PM By: Lorine Bears RCP, RRT, CHT Entered By: Lorine Bears on 11/30/2017 12:48:25

## 2017-12-01 NOTE — Progress Notes (Signed)
[name], [name] ([id number]) Visit Report for 11/30/2017 Chief Complaint Document Details Patient Name: [name], [name]. Date of Service: 11/30/2017 12:30 PM Medical Record Number: [id number] Patient Account Number: [id number] Date of Birth/Sex: 1923/03/01 (83 y.o. Female) Treating RN: Cornell Barman Primary Care Provider: FITZGERALD, DAVID Other Clinician: Referring Provider: FITZGERALD, DAVID Treating Provider/Extender: Frann Rider in Treatment: 16 Information Obtained from: Patient Chief Complaint Patient presents to the wound care center for a consult due non healing wound to the right lateral malleolus Electronic Signature(s) Signed: 11/30/2017 1:21:39 PM By: Christin Fudge MD, FACS Entered By: Christin Fudge on 11/30/2017 13:21:39 [name], [name] ([id number]) -------------------------------------------------------------------------------- HPI Details Patient Name: [name] Date of Service: 11/30/2017 12:30 PM Medical Record Number: [id number] Patient Account Number: [id number] Date of Birth/Sex: [birth year] (83 y.o. Female) Treating RN: Cornell Barman Primary Care Provider: FITZGERALD, DAVID Other Clinician: Referring Provider: FITZGERALD, DAVID Treating Provider/Extender: Frann Rider in Treatment: 16 History of Present Illness Location: Patient presents with an ulcer on the right lateral malleolus Quality: Patient reports experiencing a dull pain to affected area(s). Severity: Patient states wound are getting worse. Duration: Patient has had the wound for >2 years prior to seeking treatment at the wound center Timing: Pain in wound is constant (hurts all the time) Context: The wound would happen gradually Modifying Factors: Other treatment(s) tried include:seen a podiatrist on a vascular surgeons and under treatment with them Associated Signs and Symptoms: Patient reports having increase swelling. HPI Description: 83 year old patient who most  recently has been seeing both podiatry and vascular surgery for a long- standing ulcer of her right lateral malleolus which has been treated with various methodologies. Dr. Amalia Hailey the podiatrist saw her on 07/20/2017 and sent her to the wound center for possible hyperbaric oxygen therapy. past medical history of peripheral vascular disease, varicose veins, status post appendectomy, basal cell carcinoma excision from the left leg, cholecystectomy, pacemaker placement, right lower extremity angiography done by Dr. dew in March 2017 with placement of a stent. there is also note of a successful ablation of the right small saphenous vein done which was reviewed by ultrasound on 10/24/2016. the patient had a right small saphenous vein ablation done on 10/20/2016. The patient has never been a smoker. She has been seen by Dr. Corene Cornea dew the vascular surgeon who most recently saw her on 06/15/2017 for evaluation of ongoing problems with right leg swelling. She had a lower extremity arterial duplex examination done(02/13/17) which showed patent distal right superficial femoral artery stent and above-the-knee popliteal stent without evidence of restenosis. The ABI was more than 1.3 on the right and more than 1.3 on the left. This was consistent with noncompressible arteries due to medial calcification. The right great toe pressure and PPG waveforms are within normal limits and the left great toe pressure and PPG waveforms are decreased. he recommended she continue to wear her compression stockings and continue with elevation. She is scheduled to have a noninvasive arterial study in the near future 08/16/2017 -- had a lower extremity arterial duplex examination done which showed patent distal right superficial femoral artery stent and above-the-knee popliteal stent without evidence of restenosis. The ABI was more than 1.3 on the right and more than 1.3 on the left. This was consistent with noncompressible  arteries due to medial calcification. The right great toe pressure and PPG waveforms are within normal limits and the left great toe pressure and PPG waveforms are decreased. the x-ray of the right ankle has not yet been done  08/24/2017 -- had a right ankle x-ray -- IMPRESSION:1. No fracture, bone lesion or evidence of osteomyelitis. 2. Lateral soft tissue swelling with a soft tissue ulcer. she has not yet seen the vascular surgeon for review 08/31/17 on evaluation today patient's wound appears to be showing signs of improvement. She still with her appointment with vascular in order to review her results of her vascular study and then determine if any intervention would be recommended at that time. No fevers, chills, nausea, or vomiting noted at this time. She has been tolerating the dressing changes without complication. 09/28/17 on evaluation today patient's wound appears to show signs of good improvement in regard to the granulation tissue which is surfacing. There is still a layer of slough covering the wound and the posterior portion is still significantly deeper than the anterior nonetheless there has been some good sign of things moving towards the better. She is going to go back to Dr. dew for reevaluation to ensure her blood flow is still appropriate. That will be before her next evaluation with [place] next week. No fevers, chills, nausea, or vomiting noted at this time. Patient does have some discomfort rated to be a 3-4/10 depending on activity specifically cleansing the wound makes it worse. [name], [name] ([id number]) 10/05/2017 -- the patient was seen by Dr. Lucky Cowboy last week and noninvasive studies showed a normal right ABI with brisk triphasic waveforms consistent with no arterial insufficiency including normal digital pressures. The duplex showed a patent distal right SFA stent and the proximal SFA was also normal. He was pleased with her test and thought she should have enough of  perfusion for normal wound healing. He would see her back in 6 months time. 11/23/17 patient's wound appears to be doing excellent today on evaluation. It is filling in nicely with granulation tissue and there is only a small amount of slough over the surface of the wound. Overall she and I both are extremely pleased with the progress that she has made. No fevers, chills, nausea, or vomiting noted at this time. 11/30/2017 - the patient is being very compliant with her compression stockings and has been doing the dressings appropriately. Electronic Signature(s) Signed: 11/30/2017 1:22:04 PM By: Christin Fudge MD, FACS Entered By: Christin Fudge on 11/30/2017 13:22:04 [name] ([id number]) -------------------------------------------------------------------------------- Physical Exam Details Patient Name: [name] Date of Service: 11/30/2017 12:30 PM Medical Record Number: [id number] Patient Account Number: [id number] Date of Birth/Sex: 20-May-1923 (83 y.o. Female) Treating RN: Cornell Barman Primary Care Provider: FITZGERALD, DAVID Other Clinician: Referring Provider: FITZGERALD, DAVID Treating Provider/Extender: Frann Rider in Treatment: 16 Constitutional . Pulse regular. Respirations normal and unlabored. Afebrile. . Eyes Nonicteric. Reactive to light. Ears, Nose, Mouth, and Throat Lips, teeth, and gums WNL.Marland Kitchen Moist mucosa without lesions. Neck supple and nontender. No palpable supraclavicular or cervical adenopathy. Normal sized without goiter. Respiratory WNL. No retractions.. Cardiovascular Pedal Pulses WNL. No clubbing, cyanosis or edema. Lymphatic No adneopathy. No adenopathy. No adenopathy. Musculoskeletal Adexa without tenderness or enlargement.. Digits and nails w/o clubbing, cyanosis, infection, petechiae, ischemia, or inflammatory conditions.. Integumentary (Hair, Skin) No suspicious lesions. No crepitus or fluctuance. No peri-wound warmth or  erythema. No masses.Marland Kitchen Psychiatric Judgement and insight Intact.. No evidence of depression, anxiety, or agitation.. Notes the wound on the right lateral malleolus looks very good and it is nicely freed up with healthy granulation tissue and there is no surrounding fungal infection. No sharp debridement was required today. Electronic Signature(s) Signed: 11/30/2017 1:22:55 PM By: Con Memos,  Roderick Pee MD, FACS Entered By: Christin Fudge on 11/30/2017 13:22:55 Gallego, [name] ([id number]) -------------------------------------------------------------------------------- Physician Orders Details Patient Name: [name] Date of Service: 11/30/2017 12:30 PM Medical Record Number: [id number] Patient Account Number: [id number] Date of Birth/Sex: [birth year] (83 y.o. Female) Treating RN: Cornell Barman Primary Care Provider: FITZGERALD, DAVID Other Clinician: Referring Provider: FITZGERALD, DAVID Treating Provider/Extender: Frann Rider in Treatment: 75 Verbal / Phone Orders: No Diagnosis Coding Wound Cleansing Wound #1 Right,Lateral Malleolus o Clean wound with Normal Saline. o Cleanse wound with mild soap and water o May Shower, gently pat wound dry prior to applying new dressing. Anesthetic (add to Medication List) Wound #1 Right,Lateral Malleolus o Topical Lidocaine 4% cream applied to wound bed prior to debridement (In Clinic Only). Skin Barriers/Peri-Wound Care Wound #1 Right,Lateral Malleolus o Antifungal cream Primary Wound Dressing Wound #1 Right,Lateral Malleolus o Silvercel Non-Adherent Secondary Dressing Wound #1 Right,Lateral Malleolus o Conform/Kerlix Dressing Change Frequency Wound #1 Right,Lateral Malleolus o Change dressing every other day. Follow-up Appointments o Return Appointment in 1 week. Edema Control Wound #1 Right,Lateral Malleolus o Patient to wear own compression stockings o Patient to wear own Juxtalite/Juzo compression  garment. o Elevate legs to the level of the heart and pump ankles as often as possible Off-Loading Wound #1 Right,Lateral Malleolus o Turn and reposition every 2 hours o Other: - do not put pressure on your right ankle Additional Orders / Instructions Anastassia, Noack [name] ([id number]) Wound #1 Right,Lateral Malleolus o Increase protein intake. o Other: - Add Vitamin C, Zinc, Vitamin A to your diet Electronic Signature(s) Signed: 11/30/2017 1:42:42 PM By: Christin Fudge MD, FACS Signed: 11/30/2017 5:14:22 PM By: Gretta Cool, BSN, RN, CWS, Kim RN, BSN Entered By: Gretta Cool, BSN, RN, CWS, Kim on 11/30/2017 13:05:41 [name], [name] ([id number]) -------------------------------------------------------------------------------- Problem List Details Patient Name: RILYA, LONGO Date of Service: 11/30/2017 12:30 PM Medical Record Number: [id number] Patient Account Number: [id number] Date of Birth/Sex: 1923-03-25 (83 y.o. Female) Treating RN: Cornell Barman Primary Care Provider: FITZGERALD, DAVID Other Clinician: Referring Provider: FITZGERALD, DAVID Treating Provider/Extender: Frann Rider in Treatment: 16 Active Problems ICD-10 Encounter Code Description Active Date Diagnosis L97.312 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d 08/10/2017 Yes I70.233 Atherosclerosis of native arteries of right leg with ulceration of ankle 08/10/2017 Yes I89.0 Lymphedema, not elsewhere classified 08/10/2017 Yes B35.4 Tinea corporis 09/28/2017 Yes Inactive Problems Resolved Problems Electronic Signature(s) Signed: 11/30/2017 1:21:30 PM By: Christin Fudge MD, FACS Entered By: Christin Fudge on 11/30/2017 13:21:30 [name], [name] ([id number]) -------------------------------------------------------------------------------- Progress Note Details Patient Name: [name] Date of Service: 11/30/2017 12:30 PM Medical Record Number: [id number] Patient Account Number:  [id number] Date of Birth/Sex: Jun 10, 1923 (83 y.o. Female) Treating RN: Cornell Barman Primary Care Provider: FITZGERALD, DAVID Other Clinician: Referring Provider: FITZGERALD, DAVID Treating Provider/Extender: Frann Rider in Treatment: 16 Subjective Chief Complaint Information obtained from Patient Patient presents to the wound care center for a consult due non healing wound to the right lateral malleolus History of Present Illness (HPI) The following HPI elements were documented for the patient's wound: Location: Patient presents with an ulcer on the right lateral malleolus Quality: Patient reports experiencing a dull pain to affected area(s). Severity: Patient states wound are getting worse. Duration: Patient has had the wound for >2 years prior to seeking treatment at the wound center Timing: Pain in wound is constant (hurts all the time) Context: The wound would happen gradually Modifying Factors: Other treatment(s) tried include:seen a podiatrist on a vascular surgeons  and under treatment with them Associated Signs and Symptoms: Patient reports having increase swelling. 83 year old patient who most recently has been seeing both podiatry and vascular surgery for a long-standing ulcer of her right lateral malleolus which has been treated with various methodologies. Dr. Amalia Hailey the podiatrist saw her on 07/20/2017 and sent her to the wound center for possible hyperbaric oxygen therapy. past medical history of peripheral vascular disease, varicose veins, status post appendectomy, basal cell carcinoma excision from the left leg, cholecystectomy, pacemaker placement, right lower extremity angiography done by Dr. dew in March 2017 with placement of a stent. there is also note of a successful ablation of the right small saphenous vein done which was reviewed by ultrasound on 10/24/2016. the patient had a right small saphenous vein ablation done on 10/20/2016. The patient has never been a  smoker. She has been seen by Dr. Corene Cornea dew the vascular surgeon who most recently saw her on 06/15/2017 for evaluation of ongoing problems with right leg swelling. She had a lower extremity arterial duplex examination done(02/13/17) which showed patent distal right superficial femoral artery stent and above-the-knee popliteal stent without evidence of restenosis. The ABI was more than 1.3 on the right and more than 1.3 on the left. This was consistent with noncompressible arteries due to medial calcification. The right great toe pressure and PPG waveforms are within normal limits and the left great toe pressure and PPG waveforms are decreased. he recommended she continue to wear her compression stockings and continue with elevation. She is scheduled to have a noninvasive arterial study in the near future 08/16/2017 -- had a lower extremity arterial duplex examination done which showed patent distal right superficial femoral artery stent and above-the-knee popliteal stent without evidence of restenosis. The ABI was more than 1.3 on the right and more than 1.3 on the left. This was consistent with noncompressible arteries due to medial calcification. The right great toe pressure and PPG waveforms are within normal limits and the left great toe pressure and PPG waveforms are decreased. the x-ray of the right ankle has not yet been done 08/24/2017 -- had a right ankle x-ray -- IMPRESSION:1. No fracture, bone lesion or evidence of osteomyelitis. 2. Lateral soft tissue swelling with a soft tissue ulcer. she has not yet seen the vascular surgeon for review 08/31/17 on evaluation today patient's wound appears to be showing signs of improvement. She still with her appointment with vascular in order to review her results of her vascular study and then determine if any intervention would be recommended at that time. No fevers, chills, nausea, or vomiting noted at this time. She has been tolerating the dressing  changes without [name], [name] ([id number]) complication. 09/28/17 on evaluation today patient's wound appears to show signs of good improvement in regard to the granulation tissue which is surfacing. There is still a layer of slough covering the wound and the posterior portion is still significantly deeper than the anterior nonetheless there has been some good sign of things moving towards the better. She is going to go back to Dr. dew for reevaluation to ensure her blood flow is still appropriate. That will be before her next evaluation with [place] next week. No fevers, chills, nausea, or vomiting noted at this time. Patient does have some discomfort rated to be a 3-4/10 depending on activity specifically cleansing the wound makes it worse. 10/05/2017 -- the patient was seen by Dr. Lucky Cowboy last week and noninvasive studies showed a normal right ABI with brisk triphasic  waveforms consistent with no arterial insufficiency including normal digital pressures. The duplex showed a patent distal right SFA stent and the proximal SFA was also normal. He was pleased with her test and thought she should have enough of perfusion for normal wound healing. He would see her back in 6 months time. 11/23/17 patient's wound appears to be doing excellent today on evaluation. It is filling in nicely with granulation tissue and there is only a small amount of slough over the surface of the wound. Overall she and I both are extremely pleased with the progress that she has made. No fevers, chills, nausea, or vomiting noted at this time. 11/30/2017 - the patient is being very compliant with her compression stockings and has been doing the dressings appropriately. Patient History Information obtained from Patient. Family History Cancer - Father,Siblings, Heart Disease - Siblings, No family history of Diabetes, Hereditary Spherocytosis, Hypertension, Kidney Disease, Lung Disease, Seizures, Stroke, Thyroid Problems,  Tuberculosis. Social History Never smoker, Marital Status - Married, Alcohol Use - Never, Drug Use - No History, Caffeine Use - Rarely. Objective Constitutional Pulse regular. Respirations normal and unlabored. Afebrile. Vitals Time Taken: 12:45 PM, Height: 65 in, Weight: 154.3 lbs, BMI: 25.7, Temperature: 98.1 F, Pulse: 87 bpm, Respiratory Rate: 16 breaths/min, Blood Pressure: 138/70 mmHg. Eyes Nonicteric. Reactive to light. Ears, Nose, Mouth, and Throat Lips, teeth, and gums WNL.Marland Kitchen Moist mucosa without lesions. Neck [name], [name] ([id number]) supple and nontender. No palpable supraclavicular or cervical adenopathy. Normal sized without goiter. Respiratory WNL. No retractions.. Cardiovascular Pedal Pulses WNL. No clubbing, cyanosis or edema. Lymphatic No adneopathy. No adenopathy. No adenopathy. Musculoskeletal Adexa without tenderness or enlargement.. Digits and nails w/o clubbing, cyanosis, infection, petechiae, ischemia, or inflammatory conditions.Marland Kitchen Psychiatric Judgement and insight Intact.. No evidence of depression, anxiety, or agitation.. General Notes: the wound on the right lateral malleolus looks very good and it is nicely freed up with healthy granulation tissue and there is no surrounding fungal infection. No sharp debridement was required today. Integumentary (Hair, Skin) No suspicious lesions. No crepitus or fluctuance. No peri-wound warmth or erythema. No masses.. Wound #1 status is Open. Original cause of wound was Gradually Appeared. The wound is located on the Right,Lateral Malleolus. The wound measures 1.1cm length x 1.2cm width x 0.1cm depth; 1.037cm^2 area and 0.104cm^3 volume. There is Fat Layer (Subcutaneous Tissue) Exposed exposed. There is no tunneling or undermining noted. There is a large amount of serous drainage noted. The wound margin is distinct with the outline attached to the wound base. There is small (1-33%) red granulation within the wound  bed. There is a large (67-100%) amount of necrotic tissue within the wound bed including Adherent Slough. The periwound skin appearance exhibited: Dry/Scaly. The periwound skin appearance did not exhibit: Callus, Crepitus, Excoriation, Induration, Rash, Scarring, Maceration, Atrophie Blanche, Cyanosis, Ecchymosis, Hemosiderin Staining, Mottled, Pallor, Rubor, Erythema. Periwound temperature was noted as No Abnormality. The periwound has tenderness on palpation. Assessment Active Problems ICD-10 L97.312 - 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d I70.233 - Atherosclerosis of native arteries of right leg with ulceration of ankle I89.0 - Lymphedema, not elsewhere classified B35.4 - Tinea corporis Plan Wound Cleansing: Wound #1 Right,Lateral Malleolus: Clean wound with Normal Saline. [name], [name] ([id number]) Cleanse wound with mild soap and water May Shower, gently pat wound dry prior to applying new dressing. Anesthetic (add to Medication List): Wound #1 Right,Lateral Malleolus: Topical Lidocaine 4% cream applied to wound bed prior to debridement (In Clinic Only). Skin  Barriers/Peri-Wound Care: Wound #1 Right,Lateral Malleolus: Antifungal cream Primary Wound Dressing: Wound #1 Right,Lateral Malleolus: Silvercel Non-Adherent Secondary Dressing: Wound #1 Right,Lateral Malleolus: Conform/Kerlix Dressing Change Frequency: Wound #1 Right,Lateral Malleolus: Change dressing every other day. Follow-up Appointments: Return Appointment in 1 week. Edema Control: Wound #1 Right,Lateral Malleolus: Patient to wear own compression stockings Patient to wear own Juxtalite/Juzo compression garment. Elevate legs to the level of the heart and pump ankles as often as possible Off-Loading: Wound #1 Right,Lateral Malleolus: Turn and reposition every 2 hours Other: - do not put pressure on your right ankle Additional Orders / Instructions: Wound #1 Right,Lateral  Malleolus: Increase protein intake. Other: - Add Vitamin C, Zinc, Vitamin A to your diet I believe this week she has been very compliant with the dressing changes and her juxta light compression stockings and has made a good improvement overall. After a review, I have recommended: 1. Silver alginate with drawtex locally to be applied with a Gauze and Kerlix dressing after washing with soap and water. she may be developing an allergy to the bordered foam 2. liberal use of the antifungal to the surrounding skin -- econazole prescribed last week 3. Continue to wear her compression stockings as before -- juxta lites have also been prescribed 4. elevation and exercise discussed with her in great detail 5. Regular visits to the wound center Electronic Signature(s) Signed: 11/30/2017 1:23:43 PM By: Christin Fudge MD, FACS Entered By: Christin Fudge on 11/30/2017 13:23:42 [name], [name] ([id number]) -------------------------------------------------------------------------------- ROS/PFSH Details Patient Name: [name] Date of Service: 11/30/2017 12:30 PM Medical Record Number: [id number] Patient Account Number: [id number] Date of Birth/Sex: [birth year] (83 y.o. Female) Treating RN: Cornell Barman Primary Care Provider: FITZGERALD, DAVID Other Clinician: Referring Provider: FITZGERALD, DAVID Treating Provider/Extender: Frann Rider in Treatment: 16 Information Obtained From Patient Wound History Do you currently have one or more open woundso Yes How many open wounds do you currently haveo 1 Approximately how long have you had your woundso 2 yrs How have you been treating your wound(s) until nowo mupirocin, soaking in epsom salt Has your wound(s) ever healed and then re-openedo No Have you had any lab work done in the past montho No Have you tested positive for an antibiotic resistant organism (MRSA, VRE)o No Have you tested positive for osteomyelitis (bone infection)o  No Have you had any tests for circulation on your legso Yes Who ordered the testo Dr. Lucky Cowboy Where [facility] the test doneo [facility] Medical History: Positive for: Cataracts - surgery Cardiovascular Medical History: Positive for: Congestive Heart Failure; Hypertension Musculoskeletal Medical History: Positive for: Osteoarthritis Neurologic Medical History: Positive for: Neuropathy Oncologic Medical History: Negative for: Received Chemotherapy; Received Radiation HBO Extended History Items Eyes: Cataracts Immunizations Pneumococcal Vaccine: Received Pneumococcal Vaccination: Yes [name], [name] ([id number]) Implantable Devices Family and Social History Cancer: Yes - Father,Siblings; Diabetes: No; Heart Disease: Yes - Siblings; Hereditary Spherocytosis: No; Hypertension: No; Kidney Disease: No; Lung Disease: No; Seizures: No; Stroke: No; Thyroid Problems: No; Tuberculosis: No; Never smoker; Marital Status - Married; Alcohol Use: Never; Drug Use: No History; Caffeine Use: Rarely; Financial Concerns: No; Food, Clothing or Shelter Needs: No; Support System Lacking: No; Transportation Concerns: No; Advanced Directives: No; Patient does not want information on Advanced Directives; Do not resuscitate: No; Living Will: Yes (Not Provided); Medical Power of Attorney: No Physician Affirmation I have reviewed and agree with the above information. Electronic Signature(s) Signed: 11/30/2017 1:42:42 PM By: Christin Fudge MD, FACS Signed: 11/30/2017 5:14:22 PM By: Gretta Cool, BSN, RN,  CWS, Kim RN, BSN Entered By: Christin Fudge on 11/30/2017 13:22:12 [name] ([id number]) -------------------------------------------------------------------------------- SuperBill Details Patient Name: [name] Date of Service: 11/30/2017 Medical Record Number: [id number] Patient Account Number: [id number] Date of Birth/Sex: March 02, 1923 (83 y.o. 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 of native arteries of right leg with ulceration of ankle I89.0 Lymphedema, not elsewhere classified B35.4 Tinea corporis Facility Procedures CPT4 Code: 58527782 Description: 99213 - WOUND CARE VISIT-LEV 3 EST PT Modifier: Quantity: 1 Physician Procedures CPT4 Code: 4235361 Description: 44315 - WC PHYS LEVEL 3 - EST PT ICD-10 Diagnosis Description Q00.867 Non-pressure chronic ulcer of right ankle with fat layer expos I70.233 Atherosclerosis of native arteries of right leg with ulceratio I89.0 Lymphedema, not elsewhere  classified B35.4 Tinea corporis Modifier: ed n of ankle Quantity: 1 Electronic Signature(s) Signed: 11/30/2017 1:23:57 PM By: Christin Fudge MD, FACS Entered By: Christin Fudge on 11/30/2017 13:23:57

## 2017-12-07 ENCOUNTER — Encounter: Payer: Medicare Other | Attending: Physician Assistant | Admitting: Physician Assistant

## 2017-12-07 DIAGNOSIS — I890 Lymphedema, not elsewhere classified: Secondary | ICD-10-CM | POA: Insufficient documentation

## 2017-12-07 DIAGNOSIS — I70233 Atherosclerosis of native arteries of right leg with ulceration of ankle: Secondary | ICD-10-CM | POA: Insufficient documentation

## 2017-12-07 DIAGNOSIS — G629 Polyneuropathy, unspecified: Secondary | ICD-10-CM | POA: Diagnosis not present

## 2017-12-07 DIAGNOSIS — L97312 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d: Secondary | ICD-10-CM | POA: Insufficient documentation

## 2017-12-07 DIAGNOSIS — I110 Hypertensive heart disease with heart failure: Secondary | ICD-10-CM | POA: Insufficient documentation

## 2017-12-07 DIAGNOSIS — M1990 Unspecified osteoarthritis, unspecified site: Secondary | ICD-10-CM | POA: Diagnosis not present

## 2017-12-07 DIAGNOSIS — B354 Tinea corporis: Secondary | ICD-10-CM | POA: Diagnosis not present

## 2017-12-07 DIAGNOSIS — I509 Heart failure, unspecified: Secondary | ICD-10-CM | POA: Diagnosis not present

## 2017-12-08 NOTE — Progress Notes (Signed)
[name], [name] ([id number]) Visit Report for 12/07/2017 Arrival Information Details Patient Name: [name], [name] Date of Service: 12/07/2017 9:45 AM Medical Record Number: [id number] Patient Account Number: [id number] Date of Birth/Sex: 17-Mar-1923 (84 y.o. Female) Treating RN: Cornell Barman Primary Care Kimmi Acocella: FITZGERALD, DAVID Other Clinician: Referring Zylie Mumaw: FITZGERALD, DAVID Treating Ahsley Attwood/Extender: STONE III, HOYT Weeks in Treatment: 38 Visit Information History Since Last Visit Added or deleted any medications: No Patient Arrived: Ambulatory Any new allergies or adverse reactions: No Arrival Time: 09:35 Had a fall or experienced change in No Accompanied By: daughter activities of daily living that may affect Transfer Assistance: None risk of falls: Patient Identification Verified: Yes Signs or symptoms of abuse/neglect since last visito No Secondary Verification Process Completed: Yes Hospitalized since last visit: No Patient Requires Transmission-Based No Has Dressing in Place as Prescribed: Yes Precautions: Has Compression in Place as Prescribed: Yes Patient Has Alerts: No Pain Present Now: No Electronic Signature(s) Signed: 12/07/2017 5:00:55 PM By: Gretta Cool, BSN, RN, CWS, Kim RN, BSN Entered By: Gretta Cool, BSN, RN, CWS, Kim on 12/07/2017 09:38:07 Crowell, [name] ([id number]) -------------------------------------------------------------------------------- Encounter Discharge Information Details Patient Name: [name] Date of Service: 12/07/2017 9:45 AM Medical Record Number: [id number] Patient Account Number: [id number] Date of Birth/Sex: Nov 28, 1923 (84 y.o. Female) Treating RN: Cornell Barman Primary Care Keyvin Rison: FITZGERALD, DAVID Other Clinician: Referring Evangelyne Loja: FITZGERALD, DAVID Treating Henok Heacock/Extender: STONE III, HOYT Weeks in Treatment: 17 Encounter Discharge Information Items Discharge Pain Level: 0 Discharge Condition: Stable Ambulatory  Status: Ambulatory Discharge Destination: Home Transportation: Private Auto Accompanied By: daughter Schedule Follow-up Appointment: Yes Medication Reconciliation completed and Yes provided to Patient/Care Stehanie Ekstrom: Provided on Clinical Summary of Care: 12/07/2017 Form Type Recipient Paper Patient WS Electronic Signature(s) Signed: 12/07/2017 5:00:55 PM By: Gretta Cool, BSN, RN, CWS, Kim RN, BSN Entered By: Gretta Cool, BSN, RN, CWS, Kim on 12/07/2017 10:08:51 [name] ([id number]) -------------------------------------------------------------------------------- Lower Extremity Assessment Details Patient Name: [name] Date of Service: 12/07/2017 9:45 AM Medical Record Number: [id number] Patient Account Number: [id number] Date of Birth/Sex: Oct 21, 1923 (84 y.o. Female) Treating RN: Cornell Barman Primary Care Candi Profit: FITZGERALD, DAVID Other Clinician: Referring Macey Wurtz: FITZGERALD, DAVID Treating Axavier Pressley/Extender: STONE III, HOYT Weeks in Treatment: 17 Edema Assessment Assessed: [Left: No] [Right: No] [Left: Edema] [Right: :] Calf Left: Right: Point of Measurement: 35 cm From Medial Instep cm 30.5 cm Ankle Left: Right: Point of Measurement: 12 cm From Medial Instep cm 21.4 cm Vascular Assessment Claudication: Claudication Assessment [Right:None] Pulses: Dorsalis Pedis Palpable: [Right:Yes] Posterior Tibial Extremity colors, hair growth, and conditions: Extremity Color: [Right:Normal] Hair Growth on Extremity: [Right:No] Temperature of Extremity: [Right:Warm] Capillary Refill: [Right:< 3 seconds] Toe Nail Assessment Left: Right: Thick: Yes Discolored: Yes Deformed: Yes Improper Length and Hygiene: No Electronic Signature(s) Signed: 12/07/2017 5:00:55 PM By: Gretta Cool, BSN, RN, CWS, Kim RN, BSN Entered By: Gretta Cool, BSN, RN, CWS, Kim on 12/07/2017 09:46:38 Kloth, [name]  ([id number]) -------------------------------------------------------------------------------- Multi Wound Chart Details Patient Name: [name] Date of Service: 12/07/2017 9:45 AM Medical Record Number: [id number] Patient Account Number: [id number] Date of Birth/Sex: 05-Jun-1923 (84 y.o. Female) Treating RN: Cornell Barman Primary Care Rosaisela Jamroz: FITZGERALD, DAVID Other Clinician: Referring Taesha Goodell: FITZGERALD, DAVID Treating Harvy Riera/Extender: STONE III, HOYT Weeks in Treatment: 17 Vital Signs Height(in): 65 Pulse(bpm): 85 Weight(lbs): 154.3 Blood Pressure(mmHg): 140/61 Body Mass Index(BMI): 26 Temperature(F): 98.0 Respiratory Rate 16 (breaths/min): Photos: [N/A:N/A] Wound Location: Right Malleolus - Lateral N/A N/A Wounding Event: Gradually Appeared N/A N/A Primary Etiology: Lymphedema N/A N/A Secondary Etiology: Arterial Insufficiency Ulcer N/A N/A  Comorbid History: Cataracts, Congestive Heart N/A N/A Failure, Hypertension, Osteoarthritis, Neuropathy Date Acquired: 08/11/2015 N/A N/A Weeks of Treatment: 17 N/A N/A Wound Status: Open N/A N/A Measurements L x W x D 1x1.5x0.1 N/A N/A (cm) Area (cm) : 1.178 N/A N/A Volume (cm) : 0.118 N/A N/A % Reduction in Area: 23.10% N/A N/A % Reduction in Volume: 74.30% N/A N/A Classification: Full Thickness Without N/A N/A Exposed Support Structures Exudate Amount: Large N/A N/A Exudate Type: Serous N/A N/A Exudate Color: amber N/A N/A Wound Margin: Distinct, outline attached N/A N/A Granulation Amount: Small (1-33%) N/A N/A Granulation Quality: Red N/A N/A Necrotic Amount: Large (67-100%) N/A N/A Exposed Structures: Fat Layer (Subcutaneous N/A N/A Tissue) Exposed: Yes Epithelialization: None N/A N/A Periwound Skin Texture: Excoriation: No N/A N/A Induration: No Callus: No Penkala, [name] ([id number]) Crepitus: No Rash: No Scarring: No Periwound Skin Moisture: Maceration: No N/A N/A Dry/Scaly: No Periwound Skin  Color: Rubor: Yes N/A N/A Atrophie Blanche: No Cyanosis: No Ecchymosis: No Erythema: No Hemosiderin Staining: No Mottled: No Pallor: No Temperature: No Abnormality N/A N/A Tenderness on Palpation: Yes N/A N/A Wound Preparation: Ulcer Cleansing: N/A N/A Rinsed/Irrigated with Saline Topical Anesthetic Applied: Other: lidocaine 4% Treatment Notes Electronic Signature(s) Signed: 12/07/2017 5:00:55 PM By: Gretta Cool, BSN, RN, CWS, Kim RN, BSN Entered By: Gretta Cool, BSN, RN, CWS, Kim on 12/07/2017 09:46:51 Overholser, [name] ([id number]) -------------------------------------------------------------------------------- Marshall Details Patient Name: CHIYO, FAY Date of Service: 12/07/2017 9:45 AM Medical Record Number: [id number] Patient Account Number: [id number] Date of Birth/Sex: 1923-03-14 (84 y.o. Female) Treating RN: Cornell Barman Primary Care Leeloo Silverthorne: FITZGERALD, DAVID Other Clinician: Referring Tadd Holtmeyer: FITZGERALD, DAVID Treating Refugio Vandevoorde/Extender: STONE III, HOYT Weeks in Treatment: 17 Active Inactive ` Abuse / Safety / Falls / Self Care Management Nursing Diagnoses: Potential for falls Goals: Patient will not experience any injury related to falls Date Initiated: 08/10/2017 Target Resolution Date: 11/10/2017 Goal Status: Active Interventions: Assess Activities of Daily Living upon admission and as needed Assess fall risk on admission and as needed Assess: immobility, friction, shearing, incontinence upon admission and as needed Notes: ` Nutrition Nursing Diagnoses: Imbalanced nutrition Potential for alteratiion in Nutrition/Potential for imbalanced nutrition Goals: Patient/caregiver agrees to and verbalizes understanding of need to use nutritional supplements and/or vitamins as prescribed Date Initiated: 08/10/2017 Target Resolution Date: 12/08/2017 Goal Status: Active Interventions: Assess patient nutrition upon admission and as needed per  policy Notes: ` Orientation to the Wound Care Program Nursing Diagnoses: Knowledge deficit related to the wound healing center program Goals: Patient/caregiver will verbalize understanding of the [facility] Date Initiated: 08/10/2017 Target Resolution Date: 09/08/2017 NALEAH, KOFOED ([id number]) Goal Status: Active Interventions: Provide education on orientation to the wound center Notes: ` Pain, Acute or Chronic Nursing Diagnoses: Pain, acute or chronic: actual or potential Potential alteration in comfort, pain Goals: Patient/caregiver will verbalize adequate pain control between visits Date Initiated: 08/10/2017 Target Resolution Date: 12/08/2017 Goal Status: Active Interventions: Complete pain assessment as per visit requirements Notes: ` Wound/Skin Impairment Nursing Diagnoses: Impaired tissue integrity Knowledge deficit related to ulceration/compromised skin integrity Goals: Ulcer/skin breakdown will have a volume reduction of 80% by week 12 Date Initiated: 08/10/2017 Target Resolution Date: 12/01/2017 Goal Status: Active Interventions: Assess patient/caregiver ability to perform ulcer/skin care regimen upon admission and as needed Notes: Electronic Signature(s) Signed: 12/07/2017 5:00:55 PM By: Gretta Cool, BSN, RN, CWS, Kim RN, BSN Entered By: Gretta Cool, BSN, RN, CWS, Kim on 12/07/2017 09:46:44 Bewley, [name] ([id number]) -------------------------------------------------------------------------------- Pain Assessment Details Patient Name: Marlou Starks  G. Date of Service: 12/07/2017 9:45 AM Medical Record Number: [id number] Patient Account Number: [id number] Date of Birth/Sex: 03/24/1923 (84 y.o. Female) Treating RN: Cornell Barman Primary Care Claryssa Sandner: FITZGERALD, DAVID Other Clinician: Referring Amarria Andreasen: FITZGERALD, DAVID Treating Frans Valente/Extender: STONE III, HOYT Weeks in Treatment: 17 Active Problems Location of Pain Severity and Description of  Pain Patient Has Paino No Site Locations With Dressing Change: No Pain Management and Medication Current Pain Management: Goals for Pain Management Topical or injectable lidocaine is offered to patient for acute pain when surgical debridement is performed. If needed, Patient is instructed to use over the counter pain medication for the following 24-48 hours after debridement. Wound care MDs do not prescribed pain medications. Patient has chronic pain or uncontrolled pain. Patient has been instructed to make an appointment with their Primary Care Physician for pain management. Electronic Signature(s) Signed: 12/07/2017 5:00:55 PM By: Gretta Cool, BSN, RN, CWS, Kim RN, BSN Entered By: Gretta Cool, BSN, RN, CWS, Kim on 12/07/2017 09:38:16 Vessell, [name] ([id number]) -------------------------------------------------------------------------------- Patient/Caregiver Education Details Patient Name: [name] Date of Service: 12/07/2017 9:45 AM Medical Record Number: [id number] Patient Account Number: [id number] Date of Birth/Gender: 12-28-1922 (84 y.o. Female) Treating RN: Cornell Barman Primary Care Physician: FITZGERALD, DAVID Other Clinician: Referring Physician: FITZGERALD, DAVID Treating Physician/Extender: Melburn Hake, HOYT Weeks in Treatment: 17 Education Assessment Education Provided To: Patient Education Topics Provided Wound/Skin Impairment: Handouts: Caring for Your Ulcer, Other: dressing changes as prescribed Methods: Demonstration Responses: State content correctly Electronic Signature(s) Signed: 12/07/2017 5:00:55 PM By: Gretta Cool, BSN, RN, CWS, Kim RN, BSN Entered By: Gretta Cool, BSN, RN, CWS, Kim on 12/07/2017 10:09:16 Schloss, [name] ([id number]) -------------------------------------------------------------------------------- Wound Assessment Details Patient Name: [name] Date of Service: 12/07/2017 9:45 AM Medical Record Number: [id number] Patient Account Number:  [id number] Date of Birth/Sex: October 07, 1923 (84 y.o. Female) Treating RN: Cornell Barman Primary Care Anecia Nusbaum: FITZGERALD, DAVID Other Clinician: Referring Nonnie Pickney: FITZGERALD, DAVID Treating Nabria Nevin/Extender: STONE III, HOYT Weeks in Treatment: 17 Wound Status Wound Number: 1 Primary Lymphedema Etiology: Wound Location: Right Malleolus - Lateral Secondary Arterial Insufficiency Ulcer Wounding Event: Gradually Appeared Etiology: Date Acquired: 08/11/2015 Wound Status: Open Weeks Of Treatment: 17 Comorbid Cataracts, Congestive Heart Failure, Clustered Wound: No History: Hypertension, Osteoarthritis, Neuropathy Photos Wound Measurements Length: (cm) 1 Width: (cm) 1.5 Depth: (cm) 0.1 Area: (cm) 1.178 Volume: (cm) 0.118 % Reduction in Area: 23.1% % Reduction in Volume: 74.3% Epithelialization: None Tunneling: No Undermining: No Wound Description Full Thickness Without Exposed Support Foul Od Classification: Structures Slough/ Wound Margin: Distinct, outline attached Exudate Large Amount: Exudate Type: Serous Exudate Color: amber or After Cleansing: No Fibrino Yes Wound Bed Granulation Amount: Small (1-33%) Exposed Structure Granulation Quality: Red Fat Layer (Subcutaneous Tissue) Exposed: Yes Necrotic Amount: Large (67-100%) Necrotic Quality: Adherent Slough Periwound Skin Texture Texture Color No Abnormalities Noted: No No Abnormalities Noted: No Callus: No Atrophie Blanche: No Crepitus: No Cyanosis: No Haddix, [name] ([id number]) Excoriation: No Ecchymosis: No Induration: No Erythema: No Rash: No Hemosiderin Staining: No Scarring: No Mottled: No Pallor: No Moisture Rubor: Yes No Abnormalities Noted: No Dry / Scaly: No Temperature / Pain Maceration: No Temperature: No Abnormality Tenderness on Palpation: Yes Wound Preparation Ulcer Cleansing: Rinsed/Irrigated with Saline Topical Anesthetic Applied: Other: lidocaine 4%, Treatment Notes Wound  #1 (Right, Lateral Malleolus) 1. Cleansed with: Clean wound with Normal Saline 2. Anesthetic Topical Lidocaine 4% cream to wound bed prior to debridement 4. Dressing Applied: Hydrafera Blue 5. Secondary Dressing Applied Dry Gauze Notes stretchnet, Juxtalite Electronic Signature(s) Signed: 12/07/2017 5:00:55  PM By: Gretta Cool, BSN, RN, CWS, Kim RN, BSN Entered By: Gretta Cool, BSN, RN, CWS, Kim on 12/07/2017 09:44:20 Mehlhoff, [name] ([id number]) -------------------------------------------------------------------------------- Duncan Details Patient Name: [name] Date of Service: 12/07/2017 9:45 AM Medical Record Number: [id number] Patient Account Number: [id number] Date of Birth/Sex: February 25, 1923 (84 y.o. Female) Treating RN: Cornell Barman Primary Care Jaren Vanetten: FITZGERALD, DAVID Other Clinician: Referring Moshe Wenger: FITZGERALD, DAVID Treating Madelene Kaatz/Extender: STONE III, HOYT Weeks in Treatment: 17 Vital Signs Time Taken: 09:38 Temperature (F): 98.0 Height (in): 65 Pulse (bpm): 85 Weight (lbs): 154.3 Respiratory Rate (breaths/min): 16 Body Mass Index (BMI): 25.7 Blood Pressure (mmHg): 140/61 Reference Range: 80 - 120 mg / dl Electronic Signature(s) Signed: 12/07/2017 5:00:55 PM By: Gretta Cool, BSN, RN, CWS, Kim RN, BSN Entered By: Gretta Cool, BSN, RN, CWS, Kim on 12/07/2017 09:38:47

## 2017-12-08 NOTE — Progress Notes (Signed)
EMBRY, MANRIQUE ([id number]) Visit Report for 12/07/2017 Chief Complaint Document Details Patient Name: [name], [name]. Date of Service: 12/07/2017 9:45 AM Medical Record Number: [id number] Patient Account Number: [id number] Date of Birth/Sex: 1923/01/15 (84 y.o. Female) Treating RN: Cornell Barman Primary Care Provider: FITZGERALD, DAVID Other Clinician: Referring Provider: FITZGERALD, DAVID Treating Provider/Extender: STONE III, Laylana Gerwig Weeks in Treatment: 17 Information Obtained from: Patient Chief Complaint Patient presents to the wound care center for a consult due non healing wound to the right lateral malleolus Electronic Signature(s) Signed: 12/07/2017 7:56:36 PM By: Worthy Keeler PA-C Entered By: Worthy Keeler on 12/07/2017 09:53:07 [name], [name] ([id number]) -------------------------------------------------------------------------------- Debridement Details Patient Name: [name] Date of Service: 12/07/2017 9:45 AM Medical Record Number: [id number] Patient Account Number: [id number] Date of Birth/Sex: Nov 28, 1923 (84 y.o. Female) Treating RN: Cornell Barman Primary Care Provider: FITZGERALD, DAVID Other Clinician: Referring Provider: FITZGERALD, DAVID Treating Provider/Extender: STONE III, Partick Musselman Weeks in Treatment: 17 Debridement Performed for Wound #1 Right,Lateral Malleolus Assessment: Performed By: Physician STONE III, Tieisha Darden E., PA-C Debridement: Debridement Severity of Tissue Pre Fat layer exposed Debridement: Pre-procedure Verification/Time Yes - 09:59 Out Taken: Start Time: 09:59 Pain Control: Lidocaine 4% Topical Solution Level: Skin/Subcutaneous Tissue Total Area Debrided (L x W): 1 (cm) x 1.5 (cm) = 1.5 (cm) Tissue and other material Viable, Non-Viable, Fibrin/Slough, Subcutaneous debrided: Instrument: Curette Bleeding: Moderate Hemostasis Achieved: Pressure End Time: 10:01 Procedural Pain: 0 Response to Treatment: Procedure was tolerated  well Post Debridement Measurements of Total Wound Length: (cm) 1 Width: (cm) 1.5 Depth: (cm) 0.2 Volume: (cm) 0.236 Character of Wound/Ulcer Post Debridement: Requires Further Debridement Severity of Tissue Post Debridement: Fat layer exposed Post Procedure Diagnosis Same as Pre-procedure Electronic Signature(s) Signed: 12/07/2017 5:00:55 PM By: Gretta Cool, BSN, RN, CWS, Kim RN, BSN Signed: 12/07/2017 7:56:36 PM By: Worthy Keeler PA-C Entered By: Gretta Cool, BSN, RN, CWS, Kim on 12/07/2017 10:00:11 [name], [name] ([id number]) -------------------------------------------------------------------------------- HPI Details Patient Name: [name] Date of Service: 12/07/2017 9:45 AM Medical Record Number: [id number] Patient Account Number: [id number] Date of Birth/Sex: 10-Jun-1923 (84 y.o. Female) Treating RN: Cornell Barman Primary Care Provider: FITZGERALD, DAVID Other Clinician: Referring Provider: FITZGERALD, DAVID Treating Provider/Extender: STONE III, Isla Sabree Weeks in Treatment: 17 History of Present Illness Location: Patient presents with an ulcer on the right lateral malleolus Quality: Patient reports experiencing a dull pain to affected area(s). Severity: Patient states wound are getting worse. Duration: Patient has had the wound for >2 years prior to seeking treatment at the wound center Timing: Pain in wound is constant (hurts all the time) Context: The wound would happen gradually Modifying Factors: Other treatment(s) tried include:seen a podiatrist on a vascular surgeons and under treatment with them Associated Signs and Symptoms: Patient reports having increase swelling. 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 Dr. Amalia Hailey the podiatrist saw her on 07/20/2017 and sent her to the wound center for possible  hyperbaric oxygen therapy. past medical history of peripheral vascular disease, varicose veins, status post appendectomy, basal cell carcinoma excision from the left leg, cholecystectomy, pacemaker placement, right lower extremity angiography done by Dr. dew in March 2017 with placement of a stent. there is also note of a successful ablation of the right small saphenous vein done which was reviewed by ultrasound on 10/24/2016. the patient had a right small saphenous vein ablation done on 10/20/2016. The  patient has never been a smoker. She has been seen by Dr. Corene Cornea dew the vascular surgeon who most recently saw her on 06/15/2017 for evaluation of ongoing problems with right leg swelling. She had a lower extremity arterial duplex examination done(02/13/17) which showed patent distal right superficial femoral artery stent and above-the-knee popliteal stent without evidence of restenosis. The ABI was more than 1.3 on the right and more than 1.3 on the left. This was consistent with noncompressible arteries due to medial calcification. The right great toe pressure and PPG waveforms are within normal limits and the left great toe pressure and PPG waveforms are decreased. he recommended she continue to wear her compression stockings and continue with elevation. She is scheduled to have a noninvasive arterial study in the near future 08/16/2017 -- had a lower extremity arterial duplex examination done which showed patent distal right superficial femoral artery stent and above-the-knee popliteal stent without evidence of restenosis. The ABI was more than 1.3 on the right and more than 1.3 on the left. This was consistent with noncompressible arteries due to medial calcification. The right great toe pressure and PPG waveforms are within normal limits and the left great toe pressure and PPG waveforms are decreased. the x-ray of the right ankle has not yet been done 08/24/2017 -- had a right ankle x-ray --  IMPRESSION:1. No fracture, bone lesion or evidence of osteomyelitis. 2. Lateral soft tissue swelling with a soft tissue ulcer. she has not yet seen the vascular surgeon for review 08/31/17 on evaluation today patient's wound appears to be showing signs of improvement. She still with her appointment with vascular in order to review her results of her vascular study and then determine if any intervention would be recommended at that time. No fevers, chills, nausea, or vomiting noted at this time. She has been tolerating the dressing changes without complication. 09/28/17 on evaluation today patient's wound appears to show signs of good improvement in regard to the granulation tissue which is surfacing. There is still a layer of slough covering the wound and the posterior portion is still significantly deeper than the anterior nonetheless there has been some good sign of things moving towards the better. She is going to go back to Dr. dew for reevaluation to ensure her blood flow is still appropriate. That will be before her next evaluation with [place] next week. No fevers, chills, nausea, or vomiting noted at this time. Patient does have some discomfort rated to be a 3-4/10 depending on activity specifically cleansing the wound makes it worse. LAVIDA, PATCH ([id number]) 10/05/2017 -- the patient was seen by Dr. Lucky Cowboy last week and noninvasive studies showed a normal right ABI with brisk triphasic waveforms consistent with no arterial insufficiency including normal digital pressures. The duplex showed a patent distal right SFA stent and the proximal SFA was also normal. He was pleased with her test and thought she should have enough of perfusion for normal wound healing. He would see her back in 6 months time. 12/07/16 on evaluation today patient appears to be doing well in regard to her right lateral malleolus wound. She continues to have granulation centrally there is some Slough especially surrounding  this central location but overall she is making good progress though the measurements were a little bit larger today than previous. She has no pain at this point. Electronic Signature(s) Signed: 12/07/2017 7:56:36 PM By: Worthy Keeler PA-C Entered By: Worthy Keeler on 12/07/2017 10:29:09 [name] ([id number]) -------------------------------------------------------------------------------- Physical Exam  Details Patient Name: [name], [name]. Date of Service: 12/07/2017 9:45 AM Medical Record Number: [id number] Patient Account Number: [id number] Date of Birth/Sex: May 22, 1923 (84 y.o. Female) Treating RN: Cornell Barman Primary Care Provider: FITZGERALD, DAVID Other Clinician: Referring Provider: FITZGERALD, DAVID Treating Provider/Extender: STONE III, Brunella Wileman Weeks in Treatment: 53 Constitutional Well-nourished and well-hydrated in no acute distress. Respiratory normal breathing without difficulty. Psychiatric this patient is able to make decisions and demonstrates good insight into disease process. Alert and Oriented x 3. pleasant and cooperative. Notes Currently patient's wound is somewhat hyper granular right in the center but due to some of the rolled edges she is having a little bit more of a difficult time filling in on the sides. There is almost like a moat surrounding the island of hyper granular tissue centrally. With that being said there is no evidence of infection and the slough in this surrounding moat like region was debride it today with good relief and the wound bed did appear to be better following. Electronic Signature(s) Signed: 12/07/2017 7:56:36 PM By: Worthy Keeler PA-C Entered By: Worthy Keeler on 12/07/2017 10:32:24 [name] ([id number]) -------------------------------------------------------------------------------- Physician Orders Details Patient Name: [name] Date of Service: 12/07/2017 9:45 AM Medical Record Number:  [id number] Patient Account Number: [id number] Date of Birth/Sex: 02-11-1923 (84 y.o. 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 o Cleanse wound with mild soap and water o May Shower, gently pat wound dry prior to applying new dressing. Anesthetic (add to Medication List) Wound #1 Right,Lateral Malleolus o Topical Lidocaine 4% cream applied to wound bed prior to debridement (In Clinic Only). Primary Wound Dressing Wound #1 Right,Lateral Malleolus o Hydrafera Blue Secondary Dressing Wound #1 Right,Lateral Malleolus o Conform/Kerlix Dressing Change Frequency Wound #1 Right,Lateral Malleolus o Change dressing every other day. Follow-up Appointments o Return Appointment in 1 week. Edema Control Wound #1 Right,Lateral Malleolus o Patient to wear own Juxtalite/Juzo compression garment. o Elevate legs to the level of the heart and pump ankles as often as possible Off-Loading Wound #1 Right,Lateral Malleolus o Turn and reposition every 2 hours o Other: - do not put pressure on your right ankle Ladley, [name] ([id number]) Additional Orders / Instructions Wound #1 Right,Lateral Malleolus o Increase protein intake. o Other: - Add Vitamin C, Zinc, Vitamin A to your diet Electronic Signature(s) Signed: 12/07/2017 5:00:55 PM By: Gretta Cool, BSN, RN, CWS, Kim RN, BSN Signed: 12/07/2017 7:56:36 PM By: Worthy Keeler PA-C Entered By: Gretta Cool, BSN,  RN, CWS, Kim on 12/07/2017 10:07:41 Mcever, [name] ([id number]) -------------------------------------------------------------------------------- Problem List Details Patient Name: [name], [name] Date of Service: 12/07/2017 9:45 AM Medical Record Number: [id number] Patient Account Number: [id number] Date of Birth/Sex: 03/25/1923 (84 y.o. Female) Treating RN: Cornell Barman Primary Care Provider: FITZGERALD, DAVID Other Clinician: Referring Provider: FITZGERALD, DAVID Treating Provider/Extender: STONE III, Madisun Hargrove Weeks in Treatment: 17 Active Problems ICD-10 Encounter Code Description Active Date Diagnosis L97.312 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d 08/10/2017 Yes I70.233 Atherosclerosis of native arteries of right leg with ulceration of ankle 08/10/2017 Yes I89.0 Lymphedema, not  elsewhere classified 08/10/2017 Yes B35.4 Tinea corporis 09/28/2017 Yes Inactive Problems Resolved Problems Electronic Signature(s) Signed: 12/07/2017 7:56:36 PM By: Worthy Keeler PA-C Entered By: Worthy Keeler on 12/07/2017 09:53:01 Betke, [name] ([id number]) -------------------------------------------------------------------------------- Progress Note Details Patient Name: [name] Date of Service: 12/07/2017 9:45 AM Medical Record Number: [id number] Patient Account Number: [id number] Date of Birth/Sex: [birth year] (84 y.o. Female) Treating RN: Cornell Barman Primary Care Provider: FITZGERALD, DAVID Other Clinician: Referring Provider: FITZGERALD, DAVID Treating Provider/Extender: STONE III, Yolandra Habig Weeks in Treatment: 17 Subjective Chief Complaint Information obtained from Patient Patient presents to the wound care center for a consult due non healing wound to the right lateral malleolus History of Present Illness (HPI) The following HPI elements were documented for the patient's wound: Location: Patient presents with an ulcer on the right lateral malleolus Quality:  Patient reports experiencing a dull pain to affected area(s). Severity: Patient states wound are getting worse. Duration: Patient has had the wound for >2 years prior to seeking treatment at the wound center Timing: Pain in wound is constant (hurts all the time) Context: The wound would happen gradually Modifying Factors: Other treatment(s) tried include:seen a podiatrist on a vascular surgeons and under treatment with them Associated Signs and Symptoms: Patient reports having increase swelling. 84 year old patient who most recently has been seeing both podiatry and vascular surgery for a long-standing ulcer of her right lateral malleolus which has been treated with various methodologies. Dr. Amalia Hailey the podiatrist saw her on 07/20/2017 and sent her to the wound center for possible hyperbaric oxygen therapy. past medical history of peripheral vascular disease, varicose veins, status post appendectomy, basal cell carcinoma excision from the left leg, cholecystectomy, pacemaker placement, right lower extremity angiography done by Dr. dew in March 2017 with placement of a stent. there is also note of a successful ablation of the right small saphenous vein done which was reviewed by ultrasound on 10/24/2016. the patient had a right small saphenous vein ablation done on 10/20/2016. The patient has never been a smoker. She has been seen by Dr. Corene Cornea dew the vascular surgeon who most recently saw her on 06/15/2017 for evaluation of ongoing problems with right leg swelling. She had a lower extremity arterial duplex examination done(02/13/17) which showed patent distal right superficial femoral artery stent and above-the-knee popliteal stent without evidence of restenosis. The ABI was more than 1.3 on the right and more than 1.3 on the left. This was consistent with noncompressible arteries due to medial calcification. The right great toe pressure and PPG waveforms are within normal limits and the left great  toe pressure and PPG waveforms are decreased. he recommended she continue to wear her compression stockings and continue with elevation. She is scheduled to have a noninvasive arterial study in the near future 08/16/2017 -- had a lower extremity arterial duplex examination done which showed patent distal right superficial femoral artery stent and above-the-knee popliteal stent without evidence of restenosis. The ABI was more than 1.3 on the right and more than 1.3 on the left. This was consistent with noncompressible arteries due to medial calcification. The right great toe pressure and PPG waveforms are within normal limits and the left great toe pressure and PPG waveforms are decreased. the x-ray of the right ankle has not yet been done 08/24/2017 -- had a right ankle x-ray -- IMPRESSION:1. No fracture, bone lesion or evidence of osteomyelitis. 2. Lateral soft tissue swelling with a soft tissue ulcer. she has not yet seen the vascular surgeon for  review 08/31/17 on evaluation today patient's wound appears to be showing signs of improvement. She still with her appointment with vascular in order to review her results of her vascular study and then determine if any intervention would be recommended at that time. No fevers, chills, nausea, or vomiting noted at this time. She has been tolerating the dressing changes without [name], [name] ([id number]) complication. 09/28/17 on evaluation today patient's wound appears to show signs of good improvement in regard to the granulation tissue which is surfacing. There is still a layer of slough covering the wound and the posterior portion is still significantly deeper than the anterior nonetheless there has been some good sign of things moving towards the better. She is going to go back to Dr. dew for reevaluation to ensure her blood flow is still appropriate. That will be before her next evaluation with [place] next week. No fevers, chills, nausea, or  vomiting noted at this time. Patient does have some discomfort rated to be a 3-4/10 depending on activity specifically cleansing the wound makes it worse. 10/05/2017 -- the patient was seen by Dr. Lucky Cowboy last week and noninvasive studies showed a normal right ABI with brisk triphasic waveforms consistent with no arterial insufficiency including normal digital pressures. The duplex showed a patent distal right SFA stent and the proximal SFA was also normal. He was pleased with her test and thought she should have enough of perfusion for normal wound healing. He would see her back in 6 months time. 12/07/16 on evaluation today patient appears to be doing well in regard to her right lateral malleolus wound. She continues to have granulation centrally there is some Slough especially surrounding this central location but overall she is making good progress though the measurements were a little bit larger today than previous. She has no pain at this point. Patient History Information obtained from Patient. Family History Cancer - Father,Siblings, Heart Disease - Siblings, No family history of Diabetes, Hereditary Spherocytosis, Hypertension, Kidney Disease, Lung Disease, Seizures, Stroke, Thyroid Problems, Tuberculosis. Social History Never smoker, Marital Status - Married, Alcohol Use - Never, Drug Use - No History, Caffeine Use - Rarely. Review of Systems (ROS) Constitutional Symptoms (General Health) Denies complaints or symptoms of Fever, Chills. Respiratory The patient has no complaints or symptoms. Cardiovascular The patient has no complaints or symptoms. Psychiatric The patient has no complaints or symptoms. Objective Constitutional Well-nourished and well-hydrated in no acute distress. Vitals Time Taken: 9:38 AM, Height: 65 in, Weight: 154.3 lbs, BMI: 25.7, Temperature: 98.0 F, Pulse: 85 bpm, Respiratory Rate: 16 breaths/min, Blood Pressure: 140/61 mmHg. Respiratory normal breathing  without difficulty. [name], [name] ([id number]) Psychiatric this patient is able to make decisions and demonstrates good insight into disease process. Alert and Oriented x 3. pleasant and cooperative. General Notes: Currently patient's wound is somewhat hyper granular right in the center but due to some of the rolled edges she is having a little bit more of a difficult time filling in on the sides. There is almost like a moat surrounding the island of hyper granular tissue centrally. With that being said there is no evidence of infection and the slough in this surrounding moat like region was debride it today with good relief and the wound bed did appear to be better following. Integumentary (Hair, Skin) Wound #1 status is Open. Original cause of wound was Gradually Appeared. The wound is located on the Right,Lateral Malleolus. The wound measures 1cm length x 1.5cm width x 0.1cm depth; 1.178cm^2 area  and 0.118cm^3 volume. There is Fat Layer (Subcutaneous Tissue) Exposed exposed. There is no tunneling or undermining noted. There is a large amount of serous drainage noted. The wound margin is distinct with the outline attached to the wound base. There is small (1-33%) red granulation within the wound bed. There is a large (67-100%) amount of necrotic tissue within the wound bed including Adherent Slough. The periwound skin appearance exhibited: Rubor. The periwound skin appearance did not exhibit: Callus, Crepitus, Excoriation, Induration, Rash, Scarring, Dry/Scaly, Maceration, Atrophie Blanche, Cyanosis, Ecchymosis, Hemosiderin Staining, Mottled, Pallor, Erythema. Periwound temperature was noted as No Abnormality. The periwound has tenderness on palpation. Assessment Active Problems ICD-10 L97.312 - 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d I70.233 - Atherosclerosis of native arteries of right leg with ulceration of ankle I89.0 - Lymphedema, not elsewhere  classified B35.4 - Tinea corporis Procedures Wound #1 Pre-procedure diagnosis of Wound #1 is a Lymphedema located on the Right,Lateral Malleolus .Severity of Tissue Pre Debridement is: Fat layer exposed. There was a Skin/Subcutaneous Tissue Debridement ([id number]) debridement with total area of 1.5 sq cm performed by STONE III, Akira Perusse E., PA-C. with the following instrument(s): Curette to remove Viable and Non-Viable tissue/material including Fibrin/Slough and Subcutaneous after achieving pain control using Lidocaine 4% Topical Solution. A time out was conducted at 09:59, prior to the start of the procedure. A Moderate amount of bleeding was controlled with Pressure. The procedure was tolerated well with a pain level of 0 throughout. Post Debridement Measurements: 1cm length x 1.5cm width x 0.2cm depth; 0.236cm^3 volume. Character of Wound/Ulcer Post Debridement requires further debridement. Severity of Tissue Post Debridement is: Fat layer exposed. Post procedure Diagnosis Wound #1: Same as Pre-Procedure Loy, [name] ([id number]) Plan Wound Cleansing: Wound #1 Right,Lateral Malleolus: Clean wound with Normal Saline. Cleanse wound with mild soap and water May Shower, gently pat wound dry prior to applying new dressing. Anesthetic (add to Medication List): Wound #1 Right,Lateral Malleolus: Topical Lidocaine 4% cream applied to wound bed prior to debridement (In Clinic Only). Primary Wound Dressing: Wound #1 Right,Lateral Malleolus: Hydrafera Blue Secondary Dressing: Wound #1 Right,Lateral Malleolus: Conform/Kerlix Dressing Change Frequency: Wound #1 Right,Lateral Malleolus: Change dressing every other day. Follow-up Appointments: Return Appointment in 1 week. Edema Control: Wound #1 Right,Lateral Malleolus: Patient to wear own Juxtalite/Juzo compression garment. Elevate legs to the level of the heart and pump ankles as often as possible Off-Loading: Wound #1 Right,Lateral  Malleolus: Turn and reposition every 2 hours Other: - do not put pressure on your right ankle Additional Orders / Instructions: Wound #1 Right,Lateral Malleolus: Increase protein intake. Other: - Add Vitamin C, Zinc, Vitamin A to your diet I'm going to recommend that we try the [facility] Dressing for the next week will see how things do. Hopefully she will continue to show good signs of improvement. Please see above for specific wound care orders. We will see patient for re- evaluation in 1 week(s) here in the clinic. If anything worsens or changes patient will contact our office for additional recommendations. Electronic Signature(s) Signed: 12/07/2017 7:56:36 PM By: Worthy Keeler PA-C Entered By: Worthy Keeler on 12/07/2017 10:33:14 Kregel, [name] ([id number]) -------------------------------------------------------------------------------- ROS/PFSH Details Patient Name: [name] Date of Service: 12/07/2017 9:45 AM Medical Record Number: [id number] Patient Account Number: [id number] Date of Birth/Sex: December 08, 1922 (84 y.o. Female) Treating RN: Cornell Barman Primary Care Provider: FITZGERALD, DAVID Other Clinician: Referring Provider: FITZGERALD, DAVID Treating Provider/Extender: STONE III, Dewarren Ledbetter Weeks in Treatment: 17 Information Obtained  From Patient Wound History Do you currently have one or more open woundso Yes How many open wounds do you currently haveo 1 Approximately how long have you had your woundso 2 yrs How have you been treating your wound(s) until nowo mupirocin, soaking in epsom salt Has your wound(s) ever healed and then re-openedo No Have you had any lab work done in the past montho No Have you tested positive for an antibiotic resistant organism (MRSA, VRE)o No Have you tested positive for osteomyelitis (bone infection)o No Have you had any tests for circulation on your legso Yes Who ordered the testo Dr. Lucky Cowboy Where was the test doneo  [facility] Constitutional Symptoms (General Health) Complaints and Symptoms: Negative for: Fever; Chills Eyes Medical History: Positive for: Cataracts - surgery Respiratory Complaints and Symptoms: No Complaints or Symptoms Cardiovascular Complaints and Symptoms: No Complaints or Symptoms Medical History: Positive for: Congestive Heart Failure; Hypertension Musculoskeletal Medical History: Positive for: Osteoarthritis Neurologic Medical History: Positive for: Neuropathy Oncologic [name], [name] ([id number]) Medical History: Negative for: Received Chemotherapy; Received Radiation Psychiatric Complaints and Symptoms: No Complaints or Symptoms HBO Extended History Items Eyes: Cataracts Immunizations Pneumococcal Vaccine: Received Pneumococcal Vaccination: Yes Implantable Devices Family and Social History Cancer: Yes - Father,Siblings; Diabetes: No; Heart Disease: Yes - Siblings; Hereditary Spherocytosis: No; Hypertension: No; Kidney Disease: No; Lung Disease: No; Seizures: No; Stroke: No; Thyroid Problems: No; Tuberculosis: No; Never smoker; Marital Status - Married; Alcohol Use: Never; Drug Use: No History; Caffeine Use: Rarely; Financial Concerns: No; Food, Clothing or Shelter Needs: No; Support System Lacking: No; Transportation Concerns: No; Advanced Directives: No; Patient does not want information on Advanced Directives; Do not resuscitate: No; Living Will: Yes (Not Provided); Medical Power of Attorney: No Physician Affirmation I have reviewed and agree with the above information. Electronic Signature(s) Signed: 12/07/2017 5:00:55 PM By: Gretta Cool, BSN, RN, CWS, Kim RN, BSN Signed: 12/07/2017 7:56:36 PM By: Worthy Keeler PA-C Entered By: Worthy Keeler on 12/07/2017 10:29:37 [name], [name] ([id number]) -------------------------------------------------------------------------------- SuperBill Details Patient Name: [name] Date of Service:  12/07/2017 Medical Record Number: [id number] Patient Account Number: [id number] Date of Birth/Sex: 08-10-1923 (84 y.o. 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 I89.0 Lymphedema, not elsewhere classified B35.4 Tinea corporis Facility Procedures CPT4 Code: 78938101 Description: 75102 - DEB SUBQ TISSUE 20 SQ CM/< ICD-10 Diagnosis Description H85.277 Non-pressure chronic ulcer of right ankle with fat layer ex Modifier: posed Quantity: 1 Physician Procedures CPT4 Code: 8242353 Description: 61443 - WC PHYS SUBQ TISS 20 SQ CM ICD-10 Diagnosis Description X54.008 Non-pressure chronic ulcer of right ankle with fat layer ex Modifier: posed Quantity: 1 Electronic Signature(s) Signed: 12/07/2017 7:56:36 PM By: Worthy Keeler PA-C Entered By: Worthy Keeler on 12/07/2017 10:33:42

## 2017-12-13 ENCOUNTER — Encounter: Payer: Medicare Other | Admitting: Physician Assistant

## 2017-12-13 DIAGNOSIS — I70233 Atherosclerosis of native arteries of right leg with ulceration of ankle: Secondary | ICD-10-CM | POA: Diagnosis not present

## 2017-12-14 ENCOUNTER — Encounter: Payer: Medicare Other | Admitting: Physician Assistant

## 2017-12-15 NOTE — Progress Notes (Signed)
[name], [name] ([id number]) Visit Report for 12/13/2017 Arrival Information Details Patient Name: [name], [name] Date of Service: 12/13/2017 10:00 AM Medical Record Number: [id number] Patient Account Number: [id number] Date of Birth/Sex: 1923-04-16 (84 y.o. Female) Treating RN: Ahmed Prima Primary Care Curran Lenderman: FITZGERALD, DAVID Other Clinician: Referring Willean Schurman: FITZGERALD, DAVID Treating Shaelynn Dragos/Extender: STONE III, HOYT Weeks in Treatment: 59 Visit Information History Since Last Visit All ordered tests and consults were completed: No Patient Arrived: Ambulatory Added or deleted any medications: No Arrival Time: 10:14 Any new allergies or adverse reactions: No Accompanied By: daughter Had a fall or experienced change in No Transfer Assistance: None activities of daily living that may affect Patient Identification Verified: Yes risk of falls: Secondary Verification Process Completed: Yes Signs or symptoms of abuse/neglect since last visito No Patient Requires Transmission-Based No Hospitalized since last visit: No Precautions: Has Dressing in Place as Prescribed: Yes Patient Has Alerts: No Pain Present Now: No Electronic Signature(s) Signed: 12/13/2017 4:57:34 PM By: Alric Quan Entered By: Alric Quan on 12/13/2017 10:14:40 Vaux, [name] ([id number]) -------------------------------------------------------------------------------- Clinic Level of Care Assessment Details Patient Name: [name] Date of Service: 12/13/2017 10:00 AM Medical Record Number: [id number] Patient Account Number: [id number] Date of Birth/Sex: 10-26-1923 (84 y.o. Female) Treating RN: Cornell Barman Primary Care Shadee Rathod: FITZGERALD, DAVID Other Clinician: Referring Mathew Postiglione: FITZGERALD, DAVID Treating Cicely Ortner/Extender: STONE III, HOYT Weeks in Treatment: 17 Clinic Level of Care Assessment Items TOOL 1 Quantity Score []  - Use when EandM and Procedure is performed  on INITIAL visit 0 ASSESSMENTS - Nursing Assessment / Reassessment []  - General Physical Exam (combine w/ comprehensive assessment (listed just below) when 0 performed on new pt. evals) []  - 0 Comprehensive Assessment (HX, ROS, Risk Assessments, Wounds Hx, etc.) ASSESSMENTS - Wound and Skin Assessment / Reassessment []  - Dermatologic / Skin Assessment (not related to wound area) 0 ASSESSMENTS - Ostomy and/or Continence Assessment and Care []  - Incontinence Assessment and Management 0 []  - 0 Ostomy Care Assessment and Management (repouching, etc.) PROCESS - Coordination of Care []  - Simple Patient / Family Education for ongoing care 0 []  - 0 Complex (extensive) Patient / Family Education for ongoing care []  - 0 Staff obtains Programmer, systems, Records, Test Results / Process Orders []  - 0 Staff telephones HHA, Nursing Homes / Clarify orders / etc []  - 0 Routine Transfer to another Facility (non-emergent condition) []  - 0 Routine Hospital Admission (non-emergent condition) []  - 0 New Admissions / Biomedical engineer / Ordering NPWT, Apligraf, etc. []  - 0 Emergency Hospital Admission (emergent condition) PROCESS - Special Needs []  - Pediatric / Minor Patient Management 0 []  - 0 Isolation Patient Management []  - 0 Hearing / Language / Visual special needs []  - 0 Assessment of Community assistance (transportation, D/C planning, etc.) []  - 0 Additional assistance / Altered mentation []  - 0 Support Surface(s) Assessment (bed, cushion, seat, etc.) Shearon, [name] ([id number]) INTERVENTIONS - Miscellaneous []  - External ear exam 0 []  - 0 Patient Transfer (multiple staff / Civil Service fast streamer / Similar devices) []  - 0 Simple Staple / Suture removal (25 or less) []  - 0 Complex Staple / Suture removal (26 or more) []  - 0 Hypo/Hyperglycemic Management (do not check if billed separately) []  - 0 Ankle / Brachial Index (ABI) - do not check if billed separately Has the patient been seen at  the hospital within the last three years: Yes Total Score: 0 Level Of Care: ____ Electronic Signature(s) Signed: 12/13/2017 5:27:42 PM By: Gretta Cool, BSN, RN, CWS,  Maudie Mercury RN, BSN Entered By: Gretta Cool, BSN, RN, CWS, Kim on 12/13/2017 10:33:00 Joni Fears, [name] ([id number]) -------------------------------------------------------------------------------- Encounter Discharge Information Details Patient Name: [name], [name] Date of Service: 12/13/2017 10:00 AM Medical Record Number: [id number] Patient Account Number: [id number] Date of Birth/Sex: 1923/01/30 (84 y.o. Female) Treating RN: Cornell Barman Primary Care Yancey Pedley: FITZGERALD, DAVID Other Clinician: Referring Nafisa Olds: FITZGERALD, DAVID Treating Mal Asher/Extender: STONE III, HOYT Weeks in Treatment: 17 Encounter Discharge Information Items Discharge Pain Level: 0 Discharge Condition: Stable Ambulatory Status: Ambulatory Discharge Destination: Home Transportation: Private Auto Accompanied By: daughgter Schedule Follow-up Appointment: Yes Medication Reconciliation completed and Yes provided to Patient/Care Nai Dasch: Provided on Clinical Summary of Care: 12/13/2017 Form Type Recipient Paper Patient WS Electronic Signature(s) Signed: 12/14/2017 10:03:49 AM By: Ruthine Dose Entered By: Ruthine Dose on 12/13/2017 10:40:01 Blethen, [name] ([id number]) -------------------------------------------------------------------------------- Lower Extremity Assessment Details Patient Name: [name] Date of Service: 12/13/2017 10:00 AM Medical Record Number: [id number] Patient Account Number: [id number] Date of Birth/Sex: 10-03-1923 (84 y.o. Female) Treating RN: Ahmed Prima Primary Care Cori Henningsen: FITZGERALD, DAVID Other Clinician: Referring Tionne Carelli: FITZGERALD, DAVID Treating Niajah Sipos/Extender: STONE III, HOYT Weeks in Treatment: 17 Edema Assessment Assessed: [Left: No] [Right: No] [Left: Edema] [Right: :] Calf Left:  Right: Point of Measurement: 35 cm From Medial Instep cm 29.9 cm Ankle Left: Right: Point of Measurement: 12 cm From Medial Instep cm 20.7 cm Vascular Assessment Pulses: Dorsalis Pedis Palpable: [Right:Yes] Posterior Tibial Extremity colors, hair growth, and conditions: Extremity Color: [Right:Red] Temperature of Extremity: [Right:Warm] Capillary Refill: [Right:< 3 seconds] Toe Nail Assessment Left: Right: Thick: Yes Discolored: Yes Deformed: Yes Improper Length and Hygiene: Yes Electronic Signature(s) Signed: 12/13/2017 4:57:34 PM By: Alric Quan Entered By: Alric Quan on 12/13/2017 10:21:36 Croswell, [name] ([id number]) -------------------------------------------------------------------------------- Multi Wound Chart Details Patient Name: [name] Date of Service: 12/13/2017 10:00 AM Medical Record Number: [id number] Patient Account Number: [id number] Date of Birth/Sex: [birth year] (84 y.o. Female) Treating RN: Ahmed Prima Primary Care Rita Prom: FITZGERALD, DAVID Other Clinician: Referring Hiroyuki Ozanich: FITZGERALD, DAVID Treating Jaimey Franchini/Extender: STONE III, HOYT Weeks in Treatment: 17 Vital Signs Height(in): 65 Pulse(bpm): 82 Weight(lbs): 154.3 Blood Pressure(mmHg): 135/63 Body Mass Index(BMI): 26 Temperature(F): 97.6 Respiratory Rate 16 (breaths/min): Photos: [1:No Photos] [N/A:N/A] Wound Location: [1:Right Malleolus - Lateral] [N/A:N/A] Wounding Event: [1:Gradually Appeared] [N/A:N/A] Primary Etiology: [1:Lymphedema] [N/A:N/A] Secondary Etiology: [1:Arterial Insufficiency Ulcer] [N/A:N/A] Comorbid History: [1:Cataracts, Congestive Heart Failure, Hypertension, Osteoarthritis, Neuropathy] [N/A:N/A] Date Acquired: [1:08/11/2015] [N/A:N/A] Weeks of Treatment: [1:17] [N/A:N/A] Wound Status: [1:Open] [N/A:N/A] Measurements L x W x D [1:1x1x0.1] [N/A:N/A] (cm) Area (cm) : [1:0.785] [N/A:N/A] Volume (cm) : [1:0.079] [N/A:N/A] % Reduction  in Area: [1:48.80%] [N/A:N/A] % Reduction in Volume: [1:82.80%] [N/A:N/A] Classification: [1:Full Thickness Without Exposed Support Structures] [N/A:N/A] Exudate Amount: [1:Large] [N/A:N/A] Exudate Type: [1:Serous] [N/A:N/A] Exudate Color: [1:amber] [N/A:N/A] Wound Margin: [1:Distinct, outline attached] [N/A:N/A] Granulation Amount: [1:Medium (34-66%)] [N/A:N/A] Granulation Quality: [1:Red] [N/A:N/A] Necrotic Amount: [1:Medium (34-66%)] [N/A:N/A] Exposed Structures: [1:Fat Layer (Subcutaneous Tissue) Exposed: Yes] [N/A:N/A] Epithelialization: [1:None] [N/A:N/A] Periwound Skin Texture: [1:Excoriation: No Induration: No Callus: No Crepitus: No Rash: No Scarring: No] [N/A:N/A] Periwound Skin Moisture: [1:Maceration: No Dry/Scaly: No] [N/A:N/A] Periwound Skin Color: Erythema: Yes N/A N/A Rubor: Yes Atrophie Blanche: No Cyanosis: No Ecchymosis: No Hemosiderin Staining: No Mottled: No Pallor: No Erythema Location: Circumferential N/A N/A Temperature: No Abnormality N/A N/A Tenderness on Palpation: Yes N/A N/A Wound Preparation: Ulcer Cleansing: N/A N/A Rinsed/Irrigated with Saline Topical Anesthetic Applied: Other: lidocaine 4% Treatment Notes Electronic Signature(s) Signed: 12/13/2017 4:57:34 PM By: Alric Quan Entered By: Carolyne Fiscal,  Debra on 12/13/2017 10:21:53 Jonessa, Triplett [name] ([id number]) -------------------------------------------------------------------------------- [place] Details Patient Name: XIARA, KNISLEY. Date of Service: 12/13/2017 10:00 AM Medical Record Number: [id number] Patient Account Number: [id number] Date of Birth/Sex: November 28, 1923 (84 y.o. Female) Treating RN: Ahmed Prima Primary Care Roman Sandall: FITZGERALD, DAVID Other Clinician: Referring Emaley Applin: FITZGERALD, DAVID Treating Sherif Millspaugh/Extender: STONE III, HOYT Weeks in Treatment: 17 Active Inactive ` Abuse / Safety / Falls / Self Care Management Nursing  Diagnoses: Potential for falls Goals: Patient will not experience any injury related to falls Date Initiated: 08/10/2017 Target Resolution Date: 11/10/2017 Goal Status: Active Interventions: Assess Activities of Daily Living upon admission and as needed Assess fall risk on admission and as needed Assess: immobility, friction, shearing, incontinence upon admission and as needed Notes: ` Nutrition Nursing Diagnoses: Imbalanced nutrition Potential for alteratiion in Nutrition/Potential for imbalanced nutrition Goals: Patient/caregiver agrees to and verbalizes understanding of need to use nutritional supplements and/or vitamins as prescribed Date Initiated: 08/10/2017 Target Resolution Date: 12/08/2017 Goal Status: Active Interventions: Assess patient nutrition upon admission and as needed per policy Notes: ` Orientation to the Wound Care Program Nursing Diagnoses: Knowledge deficit related to the wound healing center program Goals: Patient/caregiver will verbalize understanding of the [place] [facility] Date Initiated: 08/10/2017 Target Resolution Date: 09/08/2017 KATHRENE, SINOPOLI ([id number]) Goal Status: Active Interventions: Provide education on orientation to the wound center Notes: ` Pain, Acute or Chronic Nursing Diagnoses: Pain, acute or chronic: actual or potential Potential alteration in comfort, pain Goals: Patient/caregiver will verbalize adequate pain control between visits Date Initiated: 08/10/2017 Target Resolution Date: 12/08/2017 Goal Status: Active Interventions: Complete pain assessment as per visit requirements Notes: ` Wound/Skin Impairment Nursing Diagnoses: Impaired tissue integrity Knowledge deficit related to ulceration/compromised skin integrity Goals: Ulcer/skin breakdown will have a volume reduction of 80% by week 12 Date Initiated: 08/10/2017 Target Resolution Date: 12/01/2017 Goal Status: Active Interventions: Assess  patient/caregiver ability to perform ulcer/skin care regimen upon admission and as needed Notes: Electronic Signature(s) Signed: 12/13/2017 4:57:34 PM By: Alric Quan Entered By: Alric Quan on 12/13/2017 10:21:46 Fulwider, [name] ([id number]) -------------------------------------------------------------------------------- Pain Assessment Details Patient Name: [name] Date of Service: 12/13/2017 10:00 AM Medical Record Number: [id number] Patient Account Number: [id number] Date of Birth/Sex: 1923-05-02 (84 y.o. Female) Treating RN: Ahmed Prima Primary Care Hannah Crill: FITZGERALD, DAVID Other Clinician: Referring Chyrel Taha: FITZGERALD, DAVID Treating Roderick Sweezy/Extender: STONE III, HOYT Weeks in Treatment: 17 Active Problems Location of Pain Severity and Description of Pain Patient Has Paino No Site Locations Pain Management and Medication Current Pain Management: Electronic Signature(s) Signed: 12/13/2017 4:57:34 PM By: Alric Quan Entered By: Alric Quan on 12/13/2017 10:14:46 Hulbert, [name] ([id number]) -------------------------------------------------------------------------------- Patient/Caregiver Education Details Patient Name: [name] Date of Service: 12/13/2017 10:00 AM Medical Record Number: [id number] Patient Account Number: [id number] Date of Birth/Gender: [birth year] (84 y.o. Female) Treating RN: Cornell Barman Primary Care Physician: FITZGERALD, DAVID Other Clinician: Referring Physician: FITZGERALD, DAVID Treating Physician/Extender: Melburn Hake, HOYT Weeks in Treatment: 17 Education Assessment Education Provided To: Caregiver Education Topics Provided Wound/Skin Impairment: Handouts: Caring for Your Ulcer, Other: continue wound care as prescribed Methods: Demonstration, Explain/Verbal Responses: State content correctly Electronic Signature(s) Signed: 12/13/2017 5:27:42 PM By: Gretta Cool, BSN, RN, CWS, Kim RN, BSN Entered By:  Gretta Cool, BSN, RN, CWS, Kim on 12/13/2017 10:34:32 Mayorquin, [name] ([id number]) -------------------------------------------------------------------------------- Wound Assessment Details Patient Name: [name] Date of Service: 12/13/2017 10:00 AM Medical Record Number: [id number] Patient Account Number: [id number] Date of Birth/Sex: 11/01/1923 (84 y.o. Female) Treating RN: Carolyne Fiscal,  Debi Primary Care Kasyn Stouffer: FITZGERALD, DAVID Other Clinician: Referring Mihailo Sage: FITZGERALD, DAVID Treating Hema Lanza/Extender: STONE III, HOYT Weeks in Treatment: 17 Wound Status Wound Number: 1 Primary Lymphedema Etiology: Wound Location: Right Malleolus - Lateral Secondary Arterial Insufficiency Ulcer Wounding Event: Gradually Appeared Etiology: Date Acquired: 08/11/2015 Wound Status: Open Weeks Of Treatment: 17 Comorbid Cataracts, Congestive Heart Failure, Clustered Wound: No History: Hypertension, Osteoarthritis, Neuropathy Photos Photo Uploaded By: Alric Quan on 12/13/2017 16:55:33 Wound Measurements Length: (cm) 1 Width: (cm) 1 Depth: (cm) 0.1 Area: (cm) 0.785 Volume: (cm) 0.079 % Reduction in Area: 48.8% % Reduction in Volume: 82.8% Epithelialization: None Tunneling: No Undermining: No Wound Description Full Thickness Without Exposed Support Classification: Structures Wound Margin: Distinct, outline attached Exudate Large Amount: Exudate Type: Serous Exudate Color: amber Foul Odor After Cleansing: No Slough/Fibrino Yes Wound Bed Granulation Amount: Medium (34-66%) Exposed Structure Granulation Quality: Red Fat Layer (Subcutaneous Tissue) Exposed: Yes Necrotic Amount: Medium (34-66%) Necrotic Quality: Adherent Slough Periwound Skin Texture Texture Color Soliz, [name] ([id number]) No Abnormalities Noted: No No Abnormalities Noted: No Callus: No Atrophie Blanche: No Crepitus: No Cyanosis: No Excoriation: No Ecchymosis: No Induration:  No Erythema: Yes Rash: No Erythema Location: Circumferential Scarring: No Hemosiderin Staining: No Mottled: No Moisture Pallor: No No Abnormalities Noted: No Rubor: Yes Dry / Scaly: No Maceration: No Temperature / Pain Temperature: No Abnormality Tenderness on Palpation: Yes Wound Preparation Ulcer Cleansing: Rinsed/Irrigated with Saline Topical Anesthetic Applied: Other: lidocaine 4%, Treatment Notes Wound #1 (Right, Lateral Malleolus) 1. Cleansed with: Clean wound with Normal Saline 2. Anesthetic Topical Lidocaine 4% cream to wound bed prior to debridement 4. Dressing Applied: Hydrafera Blue 7. Secured with Patient to wear own compression stockings Notes stretchnet, Juxtalite Electronic Signature(s) Signed: 12/13/2017 4:57:34 PM By: Alric Quan Entered By: Alric Quan on 12/13/2017 10:19:53 Ignatowski, [name] ([id number]) -------------------------------------------------------------------------------- Vitals Details Patient Name: [name] Date of Service: 12/13/2017 10:00 AM Medical Record Number: [id number] Patient Account Number: [id number] Date of Birth/Sex: 1923-06-07 (84 y.o. Female) Treating RN: Ahmed Prima Primary Care Maddeline Roorda: FITZGERALD, DAVID Other Clinician: Referring Jaydalee Bardwell: FITZGERALD, DAVID Treating Othal Kubitz/Extender: STONE III, HOYT Weeks in Treatment: 17 Vital Signs Time Taken: 10:14 Temperature (F): 97.6 Height (in): 65 Pulse (bpm): 82 Weight (lbs): 154.3 Respiratory Rate (breaths/min): 16 Body Mass Index (BMI): 25.7 Blood Pressure (mmHg): 135/63 Reference Range: 80 - 120 mg / dl Electronic Signature(s) Signed: 12/13/2017 4:57:34 PM By: Alric Quan Entered By: Alric Quan on 12/13/2017 10:15:54

## 2017-12-16 NOTE — Progress Notes (Signed)
[name], [name] ([id number]) Visit Report for 12/13/2017 Chief Complaint Document Details Patient Name: [name], [name]. Date of Service: 12/13/2017 10:00 AM Medical Record Number: [id number] Patient Account Number: [id number] Date of Birth/Sex: 1923/03/09 (84 y.o. Female) Treating RN: Cornell Barman Primary Care Provider: FITZGERALD, DAVID Other Clinician: Referring Provider: FITZGERALD, DAVID Treating Provider/Extender: STONE III, Devonne Lalani Weeks in Treatment: 17 Information Obtained from: Patient Chief Complaint Patient presents to the wound care center for a consult due non healing wound to the right lateral malleolus Electronic Signature(s) Signed: 12/14/2017 8:05:24 AM By: Worthy Keeler PA-C Entered By: Worthy Keeler on 12/13/2017 09:45:01 [name], [name] ([id number]) -------------------------------------------------------------------------------- Debridement Details Patient Name: [name] Date of Service: 12/13/2017 10:00 AM Medical Record Number: [id number] Patient Account Number: [id number] Date of Birth/Sex: 1923-03-31 (84 y.o. Female) Treating RN: Cornell Barman Primary Care Provider: FITZGERALD, DAVID Other Clinician: Referring Provider: FITZGERALD, DAVID Treating Provider/Extender: STONE III, Dajanay Northrup Weeks in Treatment: 17 Debridement Performed for Wound #1 Right,Lateral Malleolus Assessment: Performed By: Physician STONE III, Yarelis Ambrosino E., PA-C Debridement: Debridement Severity of Tissue Pre Fat layer exposed Debridement: Pre-procedure Verification/Time Yes - 10:29 Out Taken: Start Time: 10:29 Pain Control: Other : lidocaine 4% Level: Skin/Subcutaneous Tissue Total Area Debrided (L x W): 1 (cm) x 1 (cm) = 1 (cm) Tissue and other material Viable, Non-Viable, Fibrin/Slough, Subcutaneous debrided: Instrument: Curette Bleeding: Moderate Hemostasis Achieved: Pressure End Time: 10:31 Procedural Pain: 0 Post Procedural Pain: 0 Response to Treatment:  Procedure was tolerated well Post Debridement Measurements of Total Wound Length: (cm) 1 Width: (cm) 1 Depth: (cm) 0.1 Volume: (cm) 0.079 Character of Wound/Ulcer Post Debridement: Stable Severity of Tissue Post Debridement: Fat layer exposed Post Procedure Diagnosis Same as Pre-procedure Electronic Signature(s) Signed: 12/13/2017 5:27:42 PM By: Gretta Cool, BSN, RN, CWS, Kim RN, BSN Signed: 12/14/2017 8:05:24 AM By: Worthy Keeler PA-C Entered By: Gretta Cool, BSN, RN, CWS, Kim on 12/13/2017 10:31:00 [name], [name] ([id number]) -------------------------------------------------------------------------------- HPI Details Patient Name: [name] Date of Service: 12/13/2017 10:00 AM Medical Record Number: [id number] Patient Account Number: [id number] Date of Birth/Sex: January 09, 1923 (84 y.o. Female) Treating RN: Ahmed Prima Primary Care Provider: FITZGERALD, DAVID Other Clinician: Referring Provider: FITZGERALD, DAVID Treating Provider/Extender: STONE III, Arelis Neumeier Weeks in Treatment: 17 History of Present Illness Location: Patient presents with an ulcer on the right lateral malleolus Quality: Patient reports experiencing a dull pain to affected area(s). Severity: Patient states wound are getting worse. Duration: Patient has had the wound for >2 years prior to seeking treatment at the wound center Timing: Pain in wound is constant (hurts all the time) Context: The wound would happen gradually Modifying Factors: Other treatment(s) tried include:seen a podiatrist on a vascular surgeons and under treatment with them Associated Signs and Symptoms: Patient reports having increase swelling. 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 Dr. Amalia Hailey the podiatrist saw her on 07/20/2017 and sent her to the wound center for  possible hyperbaric oxygen therapy. past medical history of peripheral vascular disease, varicose veins, status post appendectomy, basal cell carcinoma excision from the left leg, cholecystectomy, pacemaker placement, right lower extremity angiography done by Dr. dew in March 2017 with placement of a stent. there is also note of a successful ablation of the right small saphenous vein done which was reviewed by ultrasound on 10/24/2016. the patient had a right small saphenous vein ablation done on  10/20/2016. The patient has never been a smoker. She has been seen by Dr. Corene Cornea dew the vascular surgeon who most recently saw her on 06/15/2017 for evaluation of ongoing problems with right leg swelling. She had a lower extremity arterial duplex examination done(02/13/17) which showed patent distal right superficial femoral artery stent and above-the-knee popliteal stent without evidence of restenosis. The ABI was more than 1.3 on the right and more than 1.3 on the left. This was consistent with noncompressible arteries due to medial calcification. The right great toe pressure and PPG waveforms are within normal limits and the left great toe pressure and PPG waveforms are decreased. he recommended she continue to wear her compression stockings and continue with elevation. She is scheduled to have a noninvasive arterial study in the near future 08/16/2017 -- had a lower extremity arterial duplex examination done which showed patent distal right superficial femoral artery stent and above-the-knee popliteal stent without evidence of restenosis. The ABI was more than 1.3 on the right and more than 1.3 on the left. This was consistent with noncompressible arteries due to medial calcification. The right great toe pressure and PPG waveforms are within normal limits and the left great toe pressure and PPG waveforms are decreased. the x-ray of the right ankle has not yet been done 08/24/2017 -- had a right ankle  x-ray -- IMPRESSION:1. No fracture, bone lesion or evidence of osteomyelitis. 2. Lateral soft tissue swelling with a soft tissue ulcer. she has not yet seen the vascular surgeon for review 08/31/17 on evaluation today patient's wound appears to be showing signs of improvement. She still with her appointment with vascular in order to review her results of her vascular study and then determine if any intervention would be recommended at that time. No fevers, chills, nausea, or vomiting noted at this time. She has been tolerating the dressing changes without complication. 09/28/17 on evaluation today patient's wound appears to show signs of good improvement in regard to the granulation tissue which is surfacing. There is still a layer of slough covering the wound and the posterior portion is still significantly deeper than the anterior nonetheless there has been some good sign of things moving towards the better. She is going to go back to Dr. dew for reevaluation to ensure her blood flow is still appropriate. That will be before her next evaluation with [place] next week. No fevers, chills, nausea, or vomiting noted at this time. Patient does have some discomfort rated to be a 3-4/10 depending on activity specifically cleansing the wound makes it worse. [name], [name] ([id number]) 10/05/2017 -- the patient was seen by Dr. Lucky Cowboy last week and noninvasive studies showed a normal right ABI with brisk triphasic waveforms consistent with no arterial insufficiency including normal digital pressures. The duplex showed a patent distal right SFA stent and the proximal SFA was also normal. He was pleased with her test and thought she should have enough of perfusion for normal wound healing. He would see her back in 6 months time. 12/13/17 on evaluation today patient's wound continues to show signs of good granulation and improvement overall. She does have some Slough surrounding the area of central granulation  although this is becoming less and less week by week I feel. Overall she has been tolerating the dressing changes without complication she really has no significant pain at this point. This is excellent news and she seems to be making excellent progress. Electronic Signature(s) Signed: 12/14/2017 8:05:24 AM By: Worthy Keeler PA-C Entered  By: Worthy Keeler on 12/13/2017 18:02:18 Battie, [name] ([id number]) -------------------------------------------------------------------------------- Physical Exam Details Patient Name: [name], [name] Date of Service: 12/13/2017 10:00 AM Medical Record Number: [id number] Patient Account Number: [id number] Date of Birth/Sex: 1922/12/15 (84 y.o. Female) Treating RN: Ahmed Prima Primary Care Provider: FITZGERALD, DAVID Other Clinician: Referring Provider: FITZGERALD, DAVID Treating Provider/Extender: STONE III, Makarios Madlock Weeks in Treatment: 72 Constitutional Well-nourished and well-hydrated in no acute distress. Respiratory normal breathing without difficulty. clear to auscultation bilaterally. Psychiatric this patient is able to make decisions and demonstrates good insight into disease process. Alert and Oriented x 3. pleasant and cooperative. Notes Patient's wound bed shows granulation centrally with some surrounding slough noted at this point. There's no evidence of infection she does have some lower extremity swelling secondary to lymphedema but otherwise no infection. This was sharply debride it today without complication and post debridement the wound appear to be doing even better. Electronic Signature(s) Signed: 12/14/2017 8:05:24 AM By: Worthy Keeler PA-C Entered By: Worthy Keeler on 12/13/2017 18:03:07 [name], [name] ([id number]) -------------------------------------------------------------------------------- Physician Orders Details Patient Name: [name] Date of Service: 12/13/2017 10:00 AM Medical Record Number:  [id number] Patient Account Number: [id number] Date of Birth/Sex: December 21, 1922 (84 y.o. 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 o Cleanse wound with mild soap and water o May Shower, gently pat wound dry prior to applying new dressing. Anesthetic (add to Medication List) Wound #1 Right,Lateral Malleolus o Topical Lidocaine 4% cream applied to wound bed prior to debridement (In Clinic Only). Primary Wound Dressing Wound #1 Right,Lateral Malleolus o Hydrafera Blue Secondary Dressing Wound #1 Right,Lateral Malleolus o Dry Gauze o Other - stretch net Dressing Change Frequency Wound #1 Right,Lateral Malleolus o Change dressing every other day. Follow-up Appointments Wound #1 Right,Lateral Malleolus o Return Appointment in 1 week. Edema Control Wound #1 Right,Lateral Malleolus o Patient to wear own Juxtalite/Juzo compression garment. o Elevate legs to the level of the heart and pump ankles as often as possible Off-Loading Shrider, [name] ([id number]) Wound #1 Right,Lateral Malleolus o Turn and reposition every 2 hours o Other: - do not put pressure on your right ankle Additional Orders / Instructions Wound #1 Right,Lateral Malleolus o Increase protein intake. o Other: - Add Vitamin C, Zinc, Vitamin A to your diet Patient Medications Allergies: sulfa, metronidazole, monistat Notifications Medication Indication Start  End lidocaine DOSE topical 4 % cream - cream topical Electronic Signature(s) Signed: 12/13/2017 5:27:42 PM By: Gretta Cool, BSN, RN, CWS, Kim RN, BSN Signed: 12/14/2017 8:05:24 AM By: Worthy Keeler PA-C Entered By: Gretta Cool, BSN, RN, CWS, Kim on 12/13/2017 10:32:18 [name], [name] [name] ([id number]) -------------------------------------------------------------------------------- Prescription 12/13/2017 Patient Name: [name]. Provider: Worthy Keeler PA-C Date of Birth: 30-Apr-1923 NPI#: [id number] Sex: F DEA#: [id number] Phone #: [phone] License #: Patient Address: [place], [place] [address] [address] [place], [place] [address] [phone] Allergies sulfa metronidazole monistat Medication Medication: Route: Strength: Form: lidocaine topical 4% cream Class: TOPICAL LOCAL ANESTHETICS Dose: Frequency / Time: Indication:  cream topical Number of Refills: Number of Units: 0 Generic Substitution: Start Date: End Date: Administered at [facility]: Yes Time Administered: Time Discontinued: Note to Pharmacy: Signature(s): Date(s): Electronic Signature(s) [name], [name] ([id number]) Signed: 12/13/2017 5:27:42 PM By: Gretta Cool, BSN, RN, CWS, Kim RN, BSN Signed: 12/14/2017 8:05:24 AM By: Worthy Keeler PA-C Entered By: Gretta Cool, BSN, RN, CWS, Kim on 12/13/2017 10:32:19 Layson, [name] ([id number]) --------------------------------------------------------------------------------  Problem List Details Patient Name: [name], [name] Date of Service: 12/13/2017 10:00 AM Medical Record Number: [id number] Patient Account Number: [id number] Date of Birth/Sex: [birth year] (84 y.o. Female) Treating RN: Cornell Barman Primary Care Provider: FITZGERALD, DAVID Other Clinician: Referring Provider: FITZGERALD, DAVID Treating Provider/Extender: STONE III,  Amelia Burgard Weeks in Treatment: 17 Active Problems ICD-10 Encounter Code Description Active Date Diagnosis L97.312 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d 08/10/2017 Yes I70.233 Atherosclerosis of native arteries of right leg with ulceration of ankle 08/10/2017 Yes I89.0 Lymphedema, not elsewhere classified 08/10/2017 Yes B35.4 Tinea corporis 09/28/2017 Yes Inactive Problems Resolved Problems Electronic Signature(s) Signed: 12/14/2017 8:05:24 AM By: Worthy Keeler PA-C Entered By: Worthy Keeler on 12/13/2017 18:01:19 [name], [name] ([id number]) -------------------------------------------------------------------------------- Progress Note Details Patient Name: [name] Date of Service: 12/13/2017 10:00 AM Medical Record Number: [id number] Patient Account Number: [id number] Date of Birth/Sex: [birth year] (84 y.o. Female) Treating RN: Ahmed Prima Primary Care Provider: FITZGERALD, DAVID Other Clinician: Referring Provider: FITZGERALD, DAVID Treating Provider/Extender: STONE III, Jhair Witherington Weeks in Treatment: 17 Subjective Chief Complaint Information obtained from Patient Patient presents to the wound care center for a consult due non healing wound to the right lateral malleolus History of Present Illness (HPI) The following HPI elements were documented for the patient's wound: Location: Patient presents with an ulcer on the right lateral malleolus Quality: Patient reports experiencing a dull pain to affected area(s). Severity: Patient states wound are getting worse. Duration: Patient has had the wound for >2 years prior to seeking treatment at the wound center Timing: Pain in wound is constant (hurts all the time) Context: The wound would happen gradually Modifying Factors: Other treatment(s) tried include:seen a podiatrist on a vascular surgeons and under treatment with them Associated Signs and Symptoms: Patient reports having increase  swelling. 84 year old patient who most recently has been seeing both podiatry and vascular surgery for a long-standing ulcer of her right lateral malleolus which has been treated with various methodologies. Dr. Amalia Hailey the podiatrist saw her on 07/20/2017 and sent her to the wound center for possible hyperbaric oxygen therapy. past medical history of peripheral vascular disease, varicose veins, status post appendectomy, basal cell carcinoma excision from the left leg, cholecystectomy, pacemaker placement, right lower extremity angiography done by Dr. dew in March 2017 with placement of a stent. there is also note of a successful ablation of the right small saphenous vein done which was reviewed by ultrasound on 10/24/2016. the patient had a right small saphenous vein ablation done on 10/20/2016. The patient has never been a smoker. She has been seen by Dr. Corene Cornea dew the vascular surgeon who most recently saw her on 06/15/2017 for evaluation of ongoing problems with right leg swelling. She had a lower extremity arterial duplex examination done(02/13/17) which showed patent distal right superficial femoral artery stent and above-the-knee popliteal stent without evidence of restenosis. The ABI was more than 1.3 on the right and more than 1.3 on the left. This was consistent with noncompressible arteries due to medial calcification. The right great toe pressure and PPG  waveforms are within normal limits and the left great toe pressure and PPG waveforms are decreased. he recommended she continue to wear her compression stockings and continue with elevation. She is scheduled to have a noninvasive arterial study in the near future 08/16/2017 -- had a lower extremity arterial duplex examination done which showed patent distal right superficial femoral artery stent and above-the-knee popliteal stent without evidence of restenosis. The ABI was more than 1.3 on the right and more than 1.3 on the left. This was  consistent with noncompressible arteries due to medial calcification. The right great toe pressure and PPG waveforms are within normal limits and the left great toe pressure and PPG waveforms are decreased. the x-ray of the right ankle has not yet been done 08/24/2017 -- had a right ankle x-ray -- IMPRESSION:1. No fracture, bone lesion or evidence of osteomyelitis. 2. Lateral soft tissue swelling with a soft tissue ulcer. she has not yet seen the vascular surgeon for review 08/31/17 on evaluation today patient's wound appears to be showing signs of improvement. She still with her appointment with vascular in order to review her results of her vascular study and then determine if any intervention would be recommended at that time. No fevers, chills, nausea, or vomiting noted at this time. She has been tolerating the dressing changes without [name], [name] ([id number]) complication. 09/28/17 on evaluation today patient's wound appears to show signs of good improvement in regard to the granulation tissue which is surfacing. There is still a layer of slough covering the wound and the posterior portion is still significantly deeper than the anterior nonetheless there has been some good sign of things moving towards the better. She is going to go back to Dr. dew for reevaluation to ensure her blood flow is still appropriate. That will be before her next evaluation with [place] next week. No fevers, chills, nausea, or vomiting noted at this time. Patient does have some discomfort rated to be a 3-4/10 depending on activity specifically cleansing the wound makes it worse. 10/05/2017 -- the patient was seen by Dr. Lucky Cowboy last week and noninvasive studies showed a normal right ABI with brisk triphasic waveforms consistent with no arterial insufficiency including normal digital pressures. The duplex showed a patent distal right SFA stent and the proximal SFA was also normal. He was pleased with her test and  thought she should have enough of perfusion for normal wound healing. He would see her back in 6 months time. 12/13/17 on evaluation today patient's wound continues to show signs of good granulation and improvement overall. She does have some Slough surrounding the area of central granulation although this is becoming less and less week by week I feel. Overall she has been tolerating the dressing changes without complication she really has no significant pain at this point. This is excellent news and she seems to be making excellent progress. Patient History Information obtained from Patient. Family History Cancer - Father,Siblings, Heart Disease - Siblings, No family history of Diabetes, Hereditary Spherocytosis, Hypertension, Kidney Disease, Lung Disease, Seizures, Stroke, Thyroid Problems, Tuberculosis. Social History Never smoker, Marital Status - Married, Alcohol Use - Never, Drug Use - No History, Caffeine Use - Rarely. Review of Systems (ROS) Constitutional Symptoms (General Health) Denies complaints or symptoms of Fever, Chills. Respiratory The patient has no complaints or symptoms. Cardiovascular Complains or has symptoms of LE edema. Psychiatric The patient has no complaints or symptoms. Objective Constitutional Well-nourished and well-hydrated in no acute distress. Vitals Time Taken: 10:14 AM,  Height: 65 in, Weight: 154.3 lbs, BMI: 25.7, Temperature: 97.6 F, Pulse: 82 bpm, Respiratory Rate: 16 breaths/min, Blood Pressure: 135/63 mmHg. Respiratory [name], [name] ([id number]) normal breathing without difficulty. clear to auscultation bilaterally. Psychiatric this patient is able to make decisions and demonstrates good insight into disease process. Alert and Oriented x 3. pleasant and cooperative. General Notes: Patient's wound bed shows granulation centrally with some surrounding slough noted at this point. There's no evidence of infection she does have some lower  extremity swelling secondary to lymphedema but otherwise no infection. This was sharply debride it today without complication and post debridement the wound appear to be doing even better. Integumentary (Hair, Skin) Wound #1 status is Open. Original cause of wound was Gradually Appeared. The wound is located on the Right,Lateral Malleolus. The wound measures 1cm length x 1cm width x 0.1cm depth; 0.785cm^2 area and 0.079cm^3 volume. There is Fat Layer (Subcutaneous Tissue) Exposed exposed. There is no tunneling or undermining noted. There is a large amount of serous drainage noted. The wound margin is distinct with the outline attached to the wound base. There is medium (34-66%) red granulation within the wound bed. There is a medium (34-66%) amount of necrotic tissue within the wound bed including Adherent Slough. The periwound skin appearance exhibited: Rubor, Erythema. The periwound skin appearance did not exhibit: Callus, Crepitus, Excoriation, Induration, Rash, Scarring, Dry/Scaly, Maceration, Atrophie Blanche, Cyanosis, Ecchymosis, Hemosiderin Staining, Mottled, Pallor. The surrounding wound skin color is noted with erythema which is circumferential. Periwound temperature was noted as No Abnormality. The periwound has tenderness on palpation. Assessment Active Problems ICD-10 L97.312 - 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d I70.233 - Atherosclerosis of native arteries of right leg with ulceration of ankle I89.0 - Lymphedema, not elsewhere classified B35.4 - Tinea corporis Procedures Wound #1 Pre-procedure diagnosis of Wound #1 is a Lymphedema located on the Right,Lateral Malleolus .Severity of Tissue Pre Debridement is: Fat layer exposed. There was a Skin/Subcutaneous Tissue Debridement ([id number]) debridement with total area of 1 sq cm performed by STONE III, Denney Shein E., PA-C. with the following instrument(s): Curette to remove Viable and Non-Viable  tissue/material including Fibrin/Slough and Subcutaneous after achieving pain control using Other (lidocaine 4%). A time out was conducted at 10:29, prior to the start of the procedure. A Moderate amount of bleeding was controlled with Pressure. The procedure was tolerated well with a pain level of 0 throughout and a pain level of 0 following the procedure. Post Debridement Measurements: 1cm length x 1cm width x 0.1cm depth; 0.079cm^3 volume. Character of Wound/Ulcer Post Debridement is stable. Severity of Tissue Post Debridement is: Fat layer exposed. Post procedure Diagnosis Wound #1: Same as Pre-Procedure Neth, [name] ([id number]) Plan Wound Cleansing: Wound #1 Right,Lateral Malleolus: Clean wound with Normal Saline. Cleanse wound with mild soap and water May Shower, gently pat wound dry prior to applying new dressing. Anesthetic (add to Medication List): Wound #1 Right,Lateral Malleolus: Topical Lidocaine 4% cream applied to wound bed prior to debridement (In Clinic Only). Primary Wound Dressing: Wound #1 Right,Lateral Malleolus: Hydrafera Blue Secondary Dressing: Wound #1 Right,Lateral Malleolus: Dry Gauze Other - stretch net Dressing Change Frequency: Wound #1 Right,Lateral Malleolus: Change dressing every other day. Follow-up Appointments: Wound #1 Right,Lateral Malleolus: Return Appointment in 1 week. Edema Control: Wound #1 Right,Lateral Malleolus: Patient to wear own Juxtalite/Juzo compression garment. Elevate legs to the level of the heart and pump ankles as often as possible Off-Loading: Wound #1 Right,Lateral Malleolus: Turn and reposition every 2  hours Other: - do not put pressure on your right ankle Additional Orders / Instructions: Wound #1 Right,Lateral Malleolus: Increase protein intake. Other: - Add Vitamin C, Zinc, Vitamin A to your diet The following medication(s) was prescribed: lidocaine topical 4 % cream cream topical was prescribed at  facility We will continue with the Current wound care measures as patient seems to be doing very well. If anything worsens in the interim she will contact our office for additional recommendations. Please see above for specific wound care orders. We will see patient for re-evaluation in 1 week(s) here in the clinic. If anything worsens or changes patient will contact our office for additional recommendations. Electronic Signature(s) Signed: 12/14/2017 8:05:24 AM By: Worthy Keeler PA-C Entered By: Worthy Keeler on 12/13/2017 18:03:37 [name], [name] ([id number]) -------------------------------------------------------------------------------- ROS/PFSH Details Patient Name: [name] Date of Service: 12/13/2017 10:00 AM Medical Record Number: [id number] Patient Account Number: [id number] Date of Birth/Sex: [birth year] (84 y.o. Female) Treating RN: Ahmed Prima Primary Care Provider: FITZGERALD, DAVID Other Clinician: Referring Provider: FITZGERALD, DAVID Treating Provider/Extender: STONE III, Randal Goens Weeks in Treatment: 17 Information Obtained From Patient Wound History Do you currently have one or more open woundso Yes How many open wounds do you currently haveo 1 Approximately how long have you had your woundso 2 yrs How have you been treating your wound(s) until nowo mupirocin, soaking in epsom salt Has your wound(s) ever healed and then re-openedo No Have you had any lab work done in the past montho No Have you tested positive for an antibiotic resistant organism (MRSA, VRE)o No Have you tested positive for osteomyelitis (bone infection)o No Have you had any tests for circulation on your legso Yes Who ordered the testo Dr. Lucky Cowboy [facility] Constitutional Symptoms (General Health) Complaints and Symptoms: Negative for: Fever; Chills Cardiovascular Complaints and Symptoms: Positive for: LE edema Medical History: Positive for: Congestive Heart  Failure; Hypertension Eyes Medical History: Positive for: Cataracts - surgery Respiratory Complaints and Symptoms: No Complaints or Symptoms Musculoskeletal Medical History: Positive for: Osteoarthritis Neurologic Medical History: Positive for: Neuropathy Oncologic KIMMBERLY, WISSER ([id number]) Medical History: Negative for: Received Chemotherapy; Received Radiation Psychiatric Complaints and Symptoms: No Complaints or Symptoms HBO Extended History Items Eyes: Cataracts Immunizations Pneumococcal Vaccine: Received Pneumococcal Vaccination: Yes Implantable Devices Family and Social History Cancer: Yes - Father,Siblings; Diabetes: No; Heart Disease: Yes - Siblings; Hereditary Spherocytosis: No; Hypertension: No; Kidney Disease: No; Lung Disease: No; Seizures: No; Stroke: No; Thyroid Problems: No; Tuberculosis: No; Never smoker; Marital Status - Married; Alcohol Use: Never; Drug Use: No History; Caffeine Use: Rarely; Financial Concerns: No; Food, Clothing or Shelter Needs: No; Support System Lacking: No; Transportation Concerns: No; Advanced Directives: No; Patient does not want information on Advanced Directives; Do not resuscitate: No; Living Will: Yes (Not Provided); Medical Power of Attorney: No Physician Affirmation I have reviewed and agree with the above information. Electronic Signature(s) Signed: 12/14/2017 8:05:24 AM By: Worthy Keeler PA-C Signed: 12/14/2017 4:08:09 PM By: Alric Quan Entered By: Worthy Keeler on 12/13/2017 18:02:50 Buboltz, [name] ([id number]) -------------------------------------------------------------------------------- SuperBill Details Patient Name: [name] Date of Service: 12/13/2017 Medical Record Number: [id number] Patient Account Number: [id number] Date of Birth/Sex: October 09, 1923 (84 y.o. 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 I89.0 Lymphedema, not elsewhere classified B35.4 Tinea corporis Facility Procedures CPT4 Code: 41364383 Description: 77939 - DEB SUBQ TISSUE 20 SQ CM/< ICD-10 Diagnosis Description S88.648 Non-pressure chronic ulcer of right ankle with fat layer ex Modifier: posed Quantity: 1 Physician Procedures CPT4 Code: 4720721 Description: 82883 - WC PHYS SUBQ TISS 20 SQ CM ICD-10 Diagnosis Description D74.451 Non-pressure chronic ulcer of right ankle with fat layer ex Modifier: posed Quantity: 1 Electronic Signature(s) Signed: 12/14/2017 8:05:24 AM By: Worthy Keeler PA-C Entered By: Worthy Keeler on 12/13/2017 18:03:46

## 2017-12-21 ENCOUNTER — Encounter: Payer: Medicare Other | Admitting: Physician Assistant

## 2017-12-21 DIAGNOSIS — I70233 Atherosclerosis of native arteries of right leg with ulceration of ankle: Secondary | ICD-10-CM | POA: Diagnosis not present

## 2017-12-24 NOTE — Progress Notes (Signed)
IVER, FEHRENBACH ([id number]) Visit Report for 12/21/2017 Arrival Information Details Patient Name: [name], [name] Date of Service: 12/21/2017 12:30 PM Medical Record Number: [id number] Patient Account Number: [id number] Date of Birth/Sex: 1923/05/19 (84 y.o. Female) Treating RN: Ahmed Prima Primary Care Sloan Takagi: FITZGERALD, DAVID Other Clinician: Referring Aikam Vinje: FITZGERALD, DAVID Treating Nosson Wender/Extender: STONE III, HOYT Weeks in Treatment: 73 Visit Information History Since Last Visit All ordered tests and consults were completed: No Patient Arrived: Ambulatory Added or deleted any medications: No Arrival Time: 12:45 Any new allergies or adverse reactions: No Accompanied By: daughter Had a fall or experienced change in No Transfer Assistance: None activities of daily living that may affect Patient Identification Verified: Yes risk of falls: Secondary Verification Process Completed: Yes Signs or symptoms of abuse/neglect since last visito No Patient Requires Transmission-Based No Hospitalized since last visit: No Precautions: Has Dressing in Place as Prescribed: Yes Patient Has Alerts: No Pain Present Now: No Electronic Signature(s) Signed: 12/21/2017 5:30:48 PM By: Alric Quan Entered By: Alric Quan on 12/21/2017 12:45:51 [place], [name] ([id number]) -------------------------------------------------------------------------------- Encounter Discharge Information Details Patient Name: [name] Date of Service: 12/21/2017 12:30 PM Medical Record Number: [id number] Patient Account Number: [id number] Date of Birth/Sex: Apr 30, 1923 (84 y.o. Female) Treating RN: Ahmed Prima Primary Care Adrina Armijo: FITZGERALD, DAVID Other Clinician: Referring Muhanad Torosyan: FITZGERALD, DAVID Treating Alano Blasco/Extender: STONE III, HOYT Weeks in Treatment: 89 Encounter Discharge Information Items Discharge Pain Level: 0 Discharge Condition: Stable Ambulatory  Status: Ambulatory Discharge Destination: Home Private Transportation: Auto Accompanied By: daughter Schedule Follow-up Appointment: Yes Medication Reconciliation completed and provided No to Patient/Care Zemira Zehring: Clinical Summary of Care: Electronic Signature(s) Signed: 12/21/2017 5:30:48 PM By: Alric Quan Entered By: Alric Quan on 12/21/2017 13:04:45 [place], [name] ([id number]) -------------------------------------------------------------------------------- Lower Extremity Assessment Details Patient Name: [name] Date of Service: 12/21/2017 12:30 PM Medical Record Number: [id number] Patient Account Number: [id number] Date of Birth/Sex: 12/12/1922 (84 y.o. Female) Treating RN: Ahmed Prima Primary Care Adetokunbo Mccadden: FITZGERALD, DAVID Other Clinician: Referring Daegon Deiss: FITZGERALD, DAVID Treating Lovada Barwick/Extender: STONE III, HOYT Weeks in Treatment: 19 Vascular Assessment Pulses: Dorsalis Pedis Palpable: [Right:Yes] Posterior Tibial Extremity colors, hair growth, and conditions: Extremity Color: [Right:Red] Temperature of Extremity: [Right:Warm] Capillary Refill: [Right:< 3 seconds] Toe Nail Assessment Left: Right: Thick: Yes Discolored: Yes Deformed: Yes Improper Length and Hygiene: Yes Electronic Signature(s) Signed: 12/21/2017 12:56:04 PM By: Alric Quan Entered By: Alric Quan on 12/21/2017 12:56:04 [place], [name] ([id number]) -------------------------------------------------------------------------------- Multi Wound Chart Details Patient Name: [name] Date of Service: 12/21/2017 12:30 PM Medical Record Number: [id number] Patient Account Number: [id number] Date of Birth/Sex: February 11, 1923 (84 y.o. Female) Treating RN: Ahmed Prima Primary Care Parth Mccormac: FITZGERALD, DAVID Other Clinician: Referring Ilai Hiller: FITZGERALD, DAVID Treating Alok Minshall/Extender: STONE III, HOYT Weeks in Treatment: 19 Vital  Signs Height(in): 65 Pulse(bpm): 86 Weight(lbs): 154.3 Blood Pressure(mmHg): 125/68 Body Mass Index(BMI): 26 Temperature(F): 98.0 Respiratory Rate 16 (breaths/min): Photos: [1:No Photos] [N/A:N/A] Wound Location: [1:Right Malleolus - Lateral] [N/A:N/A] Wounding Event: [1:Gradually Appeared] [N/A:N/A] Primary Etiology: [1:Lymphedema] [N/A:N/A] Secondary Etiology: [1:Arterial Insufficiency Ulcer] [N/A:N/A] Comorbid History: [1:Cataracts, Congestive Heart Failure, Hypertension, Osteoarthritis, Neuropathy] [N/A:N/A] Date Acquired: [1:08/11/2015] [N/A:N/A] Weeks of Treatment: [1:19] [N/A:N/A] Wound Status: [1:Open] [N/A:N/A] Measurements L x W x D [1:1x0.8x0.1] [N/A:N/A] (cm) Area (cm) : [1:0.628] [N/A:N/A] Volume (cm) : [1:0.063] [N/A:N/A] % Reduction in Area: [1:59.00%] [N/A:N/A] % Reduction in Volume: [1:86.30%] [N/A:N/A] Classification: [1:Full Thickness Without Exposed Support Structures] [N/A:N/A] Exudate Amount: [1:Large] [N/A:N/A] Exudate Type: [1:Serous] [N/A:N/A] Exudate Color: [1:amber] [N/A:N/A] Wound Margin: [1:Distinct, outline attached] [N/A:N/A] Granulation Amount: [  1:Medium (34-66%)] [N/A:N/A] Granulation Quality: [1:Red] [N/A:N/A] Necrotic Amount: [1:Medium (34-66%)] [N/A:N/A] Exposed Structures: [1:Fat Layer (Subcutaneous Tissue) Exposed: Yes] [N/A:N/A] Epithelialization: [1:None] [N/A:N/A] Periwound Skin Texture: [1:Excoriation: No Induration: No Callus: No Crepitus: No Rash: No Scarring: No] [N/A:N/A] Periwound Skin Moisture: [1:Maceration: No Dry/Scaly: No] [N/A:N/A] Periwound Skin Color: Erythema: Yes N/A N/A Rubor: Yes Atrophie Blanche: No Cyanosis: No Ecchymosis: No Hemosiderin Staining: No Mottled: No Pallor: No Erythema Location: Circumferential N/A N/A Temperature: No Abnormality N/A N/A Tenderness on Palpation: Yes N/A N/A Wound Preparation: Ulcer Cleansing: N/A N/A Rinsed/Irrigated with Saline Topical Anesthetic Applied: Other:  lidocaine 4% Treatment Notes Electronic Signature(s) Signed: 12/21/2017 12:56:19 PM By: Alric Quan Entered By: Alric Quan on 12/21/2017 12:56:18 [place], [name] ([id number]) -------------------------------------------------------------------------------- [place] Details Patient Name: [name] Date of Service: 12/21/2017 12:30 PM Medical Record Number: [id number] Patient Account Number: [id number] Date of Birth/Sex: 11-21-1923 (84 y.o. Female) Treating RN: Ahmed Prima Primary Care Takako Minckler: FITZGERALD, DAVID Other Clinician: Referring Jenah Vanasten: FITZGERALD, DAVID Treating Tracye Szuch/Extender: STONE III, HOYT Weeks in Treatment: 19 Active Inactive ` Abuse / Safety / Falls / Self Care Management Nursing Diagnoses: Potential for falls Goals: Patient will not experience any injury related to falls Date Initiated: 08/10/2017 Target Resolution Date: 11/10/2017 Goal Status: Active Interventions: Assess Activities of Daily Living upon admission and as needed Assess fall risk on admission and as needed Assess: immobility, friction, shearing, incontinence upon admission and as needed Notes: ` Nutrition Nursing Diagnoses: Imbalanced nutrition Potential for alteratiion in Nutrition/Potential for imbalanced nutrition Goals: Patient/caregiver agrees to and verbalizes understanding of need to use nutritional supplements and/or vitamins as prescribed Date Initiated: 08/10/2017 Target Resolution Date: 12/08/2017 Goal Status: Active Interventions: Assess patient nutrition upon admission and as needed per policy Notes: ` Orientation to the Wound Care Program Nursing Diagnoses: Knowledge deficit related to the wound healing center program Goals: Patient/caregiver will verbalize understanding of the [place] Program Date Initiated: 08/10/2017 Target Resolution Date: 09/08/2017 SARAHELIZABETH, CONWAY ([id number]) Goal Status:  Active Interventions: Provide education on orientation to the wound center Notes: ` Pain, Acute or Chronic Nursing Diagnoses: Pain, acute or chronic: actual or potential Potential alteration in comfort, pain Goals: Patient/caregiver will verbalize adequate pain control between visits Date Initiated: 08/10/2017 Target Resolution Date: 12/08/2017 Goal Status: Active Interventions: Complete pain assessment as per visit requirements Notes: ` Wound/Skin Impairment Nursing Diagnoses: Impaired tissue integrity Knowledge deficit related to ulceration/compromised skin integrity Goals: Ulcer/skin breakdown will have a volume reduction of 80% by week 12 Date Initiated: 08/10/2017 Target Resolution Date: 12/01/2017 Goal Status: Active Interventions: Assess patient/caregiver ability to perform ulcer/skin care regimen upon admission and as needed Notes: Electronic Signature(s) Signed: 12/21/2017 12:56:10 PM By: Alric Quan Entered By: Alric Quan on 12/21/2017 12:56:10 [place], [name] ([id number]) -------------------------------------------------------------------------------- Pain Assessment Details Patient Name: [name] Date of Service: 12/21/2017 12:30 PM Medical Record Number: [id number] Patient Account Number: [id number] Date of Birth/Sex: November 18, 1923 (84 y.o. Female) Treating RN: Ahmed Prima Primary Care Akyra Bouchie: FITZGERALD, DAVID Other Clinician: Referring Kelty Szafran: FITZGERALD, DAVID Treating Dicy Smigel/Extender: STONE III, HOYT Weeks in Treatment: 19 Active Problems Location of Pain Severity and Description of Pain Patient Has Paino No Site Locations Pain Management and Medication Current Pain Management: Electronic Signature(s) Signed: 12/21/2017 5:30:48 PM By: Alric Quan Entered By: Alric Quan on 12/21/2017 12:45:58 [place], [name]  ([id number]) -------------------------------------------------------------------------------- Patient/Caregiver Education Details Patient Name: [name] Date of Service: 12/21/2017 12:30 PM Medical Record Number: [id number] Patient Account Number: [id number] Date of Birth/Gender: 1923/10/27 (84  y.o. Female) Treating RN: Ahmed Prima Primary Care Physician: FITZGERALD, DAVID Other Clinician: Referring Physician: FITZGERALD, DAVID Treating Physician/Extender: Melburn Hake, HOYT Weeks in Treatment: 62 Education Assessment Education Provided To: Patient Education Topics Provided Wound/Skin Impairment: Handouts: Caring for Your Ulcer, Other: change dressing as ordered Methods: Demonstration, Explain/Verbal Responses: State content correctly Electronic Signature(s) Signed: 12/21/2017 5:30:48 PM By: Alric Quan Entered By: Alric Quan on 12/21/2017 13:05:14 [place], [name] ([id number]) -------------------------------------------------------------------------------- Wound Assessment Details Patient Name: [name] Date of Service: 12/21/2017 12:30 PM Medical Record Number: [id number] Patient Account Number: [id number] Date of Birth/Sex: 1923/07/13 (84 y.o. Female) Treating RN: Ahmed Prima Primary Care Syble Picco: FITZGERALD, DAVID Other Clinician: Referring Ardelle Haliburton: FITZGERALD, DAVID Treating Kyzer Blowe/Extender: STONE III, HOYT Weeks in Treatment: 19 Wound Status Wound Number: 1 Primary Lymphedema Etiology: Wound Location: Right Malleolus - Lateral Secondary Arterial Insufficiency Ulcer Wounding Event: Gradually Appeared Etiology: Date Acquired: 08/11/2015 Wound Status: Open Weeks Of Treatment: 19 Comorbid Cataracts, Congestive Heart Failure, Clustered Wound: No History: Hypertension, Osteoarthritis, Neuropathy Photos Photo Uploaded By: Alric Quan on 12/21/2017 17:44:17 Wound Measurements Length: (cm) 1 Width: (cm) 0.8 Depth: (cm)  0.1 Area: (cm) 0.628 Volume: (cm) 0.063 % Reduction in Area: 59% % Reduction in Volume: 86.3% Epithelialization: None Tunneling: No Undermining: No Wound Description Full Thickness Without Exposed Support Classification: Structures Wound Margin: Distinct, outline attached Exudate Large Amount: Exudate Type: Serous Exudate Color: amber Foul Odor After Cleansing: No Slough/Fibrino Yes Wound Bed Granulation Amount: Medium (34-66%) Exposed Structure Granulation Quality: Red Fat Layer (Subcutaneous Tissue) Exposed: Yes Necrotic Amount: Medium (34-66%) Necrotic Quality: Adherent Slough Periwound Skin Texture Texture Color Nichol, [name] ([id number]) No Abnormalities Noted: No No Abnormalities Noted: No Callus: No Atrophie Blanche: No Crepitus: No Cyanosis: No Excoriation: No Ecchymosis: No Induration: No Erythema: Yes Rash: No Erythema Location: Circumferential Scarring: No Hemosiderin Staining: No Mottled: No Moisture Pallor: No No Abnormalities Noted: No Rubor: Yes Dry / Scaly: No Maceration: No Temperature / Pain Temperature: No Abnormality Tenderness on Palpation: Yes Wound Preparation Ulcer Cleansing: Rinsed/Irrigated with Saline Topical Anesthetic Applied: Other: lidocaine 4%, Treatment Notes Wound #1 (Right, Lateral Malleolus) 1. Cleansed with: Clean wound with Normal Saline 2. Anesthetic Topical Lidocaine 4% cream to wound bed prior to debridement 4. Dressing Applied: Hydrafera Blue 5. Secondary Dressing Applied Dry Gauze Notes stretchnet, Juxtalite Electronic Signature(s) Signed: 12/21/2017 5:30:48 PM By: Alric Quan Entered By: Alric Quan on 12/21/2017 12:54:04 [place], [name] ([id number]) -------------------------------------------------------------------------------- [place] Details Patient Name: [name] Date of Service: 12/21/2017 12:30 PM Medical Record Number: [id number] Patient Account Number:  [id number] Date of Birth/Sex: [birth year] (84 y.o. Female) Treating RN: Ahmed Prima Primary Care Soraida Vickers: FITZGERALD, DAVID Other Clinician: Referring Cosette Prindle: FITZGERALD, DAVID Treating Dominque Marlin/Extender: STONE III, HOYT Weeks in Treatment: 19 Vital Signs Time Taken: 12:46 Temperature (F): 98.0 Height (in): 65 Pulse (bpm): 86 Weight (lbs): 154.3 Respiratory Rate (breaths/min): 16 Body Mass Index (BMI): 25.7 Blood Pressure (mmHg): 125/68 Reference Range: 80 - 120 mg / dl Electronic Signature(s) Signed: 12/21/2017 5:30:48 PM By: Alric Quan Entered By: Alric Quan on 12/21/2017 12:49:07

## 2017-12-24 NOTE — Progress Notes (Signed)
[name], [name] ([id number]) Visit Report for 12/21/2017 Chief Complaint Document Details Patient Name: [name], [name]. Date of Service: 12/21/2017 12:30 PM Medical Record Number: [id number] Patient Account Number: [id number] Date of Birth/Sex: 05-27-1923 (84 y.o. Female) Treating RN: Ahmed Prima Primary Care Provider: FITZGERALD, DAVID Other Clinician: Referring Provider: FITZGERALD, DAVID Treating Provider/Extender: STONE III, HOYT Weeks in Treatment: 19 Information Obtained from: Patient Chief Complaint Patient presents to the wound care center for a consult due non healing wound to the right lateral malleolus Electronic Signature(s) Signed: 12/24/2017 11:19:49 AM By: Worthy Keeler PA-C Entered By: Worthy Keeler on 12/21/2017 12:55:07 Tijerina, [name] ([id number]) -------------------------------------------------------------------------------- Debridement Details Patient Name: [name] Date of Service: 12/21/2017 12:30 PM Medical Record Number: [id number] Patient Account Number: [id number] Date of Birth/Sex: Jul 14, 1923 (84 y.o. Female) Treating RN: Ahmed Prima Primary Care Provider: FITZGERALD, DAVID Other Clinician: Referring Provider: FITZGERALD, DAVID Treating Provider/Extender: STONE III, HOYT Weeks in Treatment: 19 Debridement Performed for Wound #1 Right,Lateral Malleolus Assessment: Performed By: Physician STONE III, HOYT E., PA-C Debridement: Debridement Severity of Tissue Pre Fat layer exposed Debridement: Pre-procedure Verification/Time Yes - 13:08 Out Taken: Start Time: 13:09 Pain Control: Lidocaine 4% Topical Solution Level: Skin/Subcutaneous Tissue Total Area Debrided (L x W): 1 (cm) x 0.8 (cm) = 0.8 (cm) Tissue and other material Viable, Non-Viable, Exudate, Fibrin/Slough, Subcutaneous debrided: Instrument: Curette Bleeding: Minimum Hemostasis Achieved: Pressure End Time: 13:12 Procedural Pain: 0 Post Procedural Pain:  0 Response to Treatment: Procedure was tolerated well Post Debridement Measurements of Total Wound Length: (cm) 1 Width: (cm) 0.8 Depth: (cm) 0.2 Volume: (cm) 0.126 Character of Wound/Ulcer Post Debridement: Requires Further Debridement Severity of Tissue Post Debridement: Fat layer exposed Post Procedure Diagnosis Same as Pre-procedure Electronic Signature(s) Signed: 12/21/2017 5:30:48 PM By: Alric Quan Signed: 12/24/2017 11:19:49 AM By: Worthy Keeler PA-C Entered By: Alric Quan on 12/21/2017 13:12:06 Gougeon, [name] ([id number]) -------------------------------------------------------------------------------- HPI Details Patient Name: [name] Date of Service: 12/21/2017 12:30 PM Medical Record Number: [id number] Patient Account Number: [id number] Date of Birth/Sex: 10-23-1923 (84 y.o. 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 Dr. Amalia Hailey the podiatrist saw her on 07/20/2017 and sent her to the wound center for possible hyperbaric oxygen therapy. past medical history of peripheral vascular disease, varicose veins, status post appendectomy, basal cell carcinoma excision from the left leg, cholecystectomy, pacemaker placement, right lower extremity angiography done by Dr. dew in March 2017 with placement of a stent. there is also note of a successful ablation of the right small saphenous vein done which was reviewed by ultrasound on 10/24/2016. the patient had a right small saphenous vein ablation done on 10/20/2016. The patient has never been a smoker. She has been  seen by Dr. Corene Cornea dew the vascular surgeon who most recently saw her on 06/15/2017 for evaluation of ongoing problems with right leg swelling. She had a lower extremity arterial duplex examination done(02/13/17) which showed patent distal right superficial femoral artery stent and above-the-knee popliteal stent without evidence of restenosis. The ABI was more than 1.3 on the right and more than 1.3 on the left. This was consistent with noncompressible arteries due to medial calcification. The right great toe pressure  and PPG waveforms are within normal limits and the left great toe pressure and PPG waveforms are decreased. he recommended she continue to wear her compression stockings and continue with elevation. She is scheduled to have a noninvasive arterial study in the near future 08/16/2017 -- had a lower extremity arterial duplex examination done which showed patent distal right superficial femoral artery stent and above-the-knee popliteal stent without evidence of restenosis. The ABI was more than 1.3 on the right and more than 1.3 on the left. This was consistent with noncompressible arteries due to medial calcification. The right great toe pressure and PPG waveforms are within normal limits and the left great toe pressure and PPG waveforms are decreased. the x-ray of the right ankle has not yet been done 08/24/2017 -- had a right ankle x-ray -- IMPRESSION:1. No fracture, bone lesion or evidence of osteomyelitis. 2. Lateral soft tissue swelling with a soft tissue ulcer. she has not yet seen the vascular surgeon for review 08/31/17 on evaluation today patient's wound appears to be showing signs of improvement. She still with her appointment with vascular in order to review her results of her vascular study and then determine if any intervention would be recommended at that time. No fevers, chills, nausea, or vomiting noted at this time. She has been tolerating the dressing changes  without complication. 09/28/17 on evaluation today patient's wound appears to show signs of good improvement in regard to the granulation tissue which is surfacing. There is still a layer of slough covering the wound and the posterior portion is still significantly deeper than the anterior nonetheless there has been some good sign of things moving towards the better. She is going to go back to Dr. dew for reevaluation to ensure her blood flow is still appropriate. That will be before her next evaluation with [place] next week. No fevers, chills, nausea, or vomiting noted at this time. Patient does have some discomfort rated to be a 3-4/10 depending on activity specifically cleansing the wound makes it worse. 10/05/2017 -- the patient was seen by Dr. Lucky Cowboy last week and noninvasive studies showed a normal right ABI with brisk triphasic waveforms consistent with no arterial insufficiency including normal digital pressures. The duplex showed a patent distal right SFA stent and the proximal SFA was also normal. He was pleased with her test and thought she should have enough of perfusion for normal wound healing. He would see her back in 6 months time. 12/21/17 on evaluation today patient appears to be doing fairly well in regard to her right lateral ankle wound. Unfortunately the main issue that she is expansion at this point is that she is having some issues with what appears to be some cellulitis in the ZANDRA, LAJEUNESSE. ([id number]) right anterior shin. She has also been noting a little bit of uncomfortable feeling especially last night and her ankle area. I'm afraid that she made the developing a little bit of an infection. With that being said I think it is in the early stages. Electronic Signature(s) Signed: 12/24/2017 11:19:49 AM By: Worthy Keeler PA-C Entered By: Worthy Keeler on 12/21/2017 13:17:19 Mcmurtry, [name]  ([id number]) -------------------------------------------------------------------------------- Physical Exam Details Patient Name: [name] Date of Service: 12/21/2017 12:30 PM Medical Record Number: [id number] Patient Account Number: [id number] Date of Birth/Sex: 10/09/1923 (84 y.o. Female) Treating RN: Ahmed Prima Primary Care Provider: FITZGERALD, DAVID Other Clinician: Referring Provider: FITZGERALD, DAVID Treating Provider/Extender: STONE III, HOYT Weeks in Treatment: 53 Constitutional Well-nourished and well-hydrated in  no acute distress. Respiratory normal breathing without difficulty. Psychiatric this patient is able to make decisions and demonstrates good insight into disease process. Alert and Oriented x 3. pleasant and cooperative. Notes Patient's wound bed shows some Slough covering especially in the surrounding peripheral edge of the wound although there did not appear to be any severe evidence of infection note purulent discharge and overall patient did not have any undue pain. She tolerated debridement very well and post debridement the wound appear to be very good and has fielding quite significantly in regard to the edge of the wound. Electronic Signature(s) Signed: 12/24/2017 11:19:49 AM By: Worthy Keeler PA-C Entered By: Worthy Keeler on 12/21/2017 13:18:13 Forster, [name] ([id number]) -------------------------------------------------------------------------------- Physician Orders Details Patient Name: [name] Date of Service: 12/21/2017 12:30 PM Medical Record Number: [id number] Patient Account Number: [id number] Date of Birth/Sex: 1923/02/26 (84 y.o. 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 o Cleanse wound with mild soap and water o May Shower, gently pat wound dry prior to applying new dressing. Anesthetic (add to Medication List) Wound #1 Right,Lateral Malleolus o Topical Lidocaine 4% cream applied to wound bed prior to debridement (In Clinic Only). Primary Wound Dressing Wound #1 Right,Lateral Malleolus o Hydrafera Blue Secondary Dressing Wound #1 Right,Lateral Malleolus o Dry Gauze o Other - stretch net Dressing Change Frequency Wound #1 Right,Lateral Malleolus o Change dressing every other day. Follow-up Appointments Wound #1 Right,Lateral Malleolus o Return Appointment in 1 week. Edema Control Wound #1 Right,Lateral Malleolus o Patient to wear own Juxtalite/Juzo compression garment. o Elevate legs to the level of the heart and pump ankles as often as possible Off-Loading Mcclusky, [name] ([id number]) Wound #1 Right,Lateral Malleolus o Turn and reposition every 2 hours o Other: - do not put pressure on your right ankle Additional Orders / Instructions Wound #1 Right,Lateral Malleolus o Increase protein intake. o Other: - Add Vitamin C, Zinc, Vitamin A to your diet Patient Medications Allergies: sulfa, metronidazole, monistat Notifications Medication Indication Start End lidocaine DOSE 1 - topical 4 % cream - 1 cream topical doxycycline hyclate 12/21/2017 DOSE 1 - oral 100 mg capsule - 1 capsule oral taken 2 times a day for 14 days. Take 1 hour before eating or 2 hours after eating Electronic Signature(s) Signed: 12/21/2017 1:20:09 PM By: Worthy Keeler PA-C Entered By:  Worthy Keeler on 12/21/2017 13:20:08 Buena, Boehm [name] ([id number]) -------------------------------------------------------------------------------- Prescription 12/21/2017 Patient Name: [name] Provider: Worthy Keeler PA-C Date of Birth: 1922-12-11 NPI#: [id number] Sex: F DEA#: [id number] Phone #: [phone] License #: Patient Address: [facility] [place], [place] [address] [address] [place], [place] [address] [phone] Allergies sulfa metronidazole monistat Medication Medication: Route: Strength: Form: lidocaine 4 % topical cream topical 4% cream Class: TOPICAL LOCAL ANESTHETICS Dose: Frequency / Time: Indication: 1 1 cream topical Number of Refills: Number of Units: 0 Generic Substitution: Start Date: End Date: One Time Use: Substitution Permitted No Note to  Pharmacy: Signature(s): Date(s): Electronic Signature(s) Signed: 12/24/2017 11:19:49 AM By: Sherene Sires, [name] ([id number]) Entered By: Worthy Keeler on 12/21/2017 13:20:10 Olds, [name] ([id number]) --------------------------------------------------------------------------------  Problem List Details Patient Name: KENNEISHA, COCHRANE Date of Service: 12/21/2017 12:30 PM Medical Record Number: [id number] Patient Account Number: [id number] Date of Birth/Sex: 08-Aug-1923 (84 y.o. Female) Treating RN: Ahmed Prima Primary Care Provider: FITZGERALD, DAVID Other Clinician: Referring Provider: FITZGERALD, DAVID Treating Provider/Extender: STONE III, HOYT Weeks in Treatment: 19 Active Problems ICD-10 Encounter Code Description Active Date Diagnosis L97.312 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d 08/10/2017 Yes I70.233 Atherosclerosis of native arteries of right leg with ulceration of ankle 08/10/2017 Yes I89.0 Lymphedema, not elsewhere  classified 08/10/2017 Yes B35.4 Tinea corporis 09/28/2017 Yes Inactive Problems Resolved Problems Electronic Signature(s) Signed: 12/24/2017 11:19:49 AM By: Worthy Keeler PA-C Entered By: Worthy Keeler on 12/21/2017 12:54:58 Dehaven, [name] ([id number]) -------------------------------------------------------------------------------- Progress Note Details Patient Name: [name] Date of Service: 12/21/2017 12:30 PM Medical Record Number: [id number] Patient Account Number: [id number] Date of Birth/Sex: 1922-12-15 (84 y.o. Female) Treating RN: Ahmed Prima Primary Care Provider: FITZGERALD, DAVID Other Clinician: Referring Provider: FITZGERALD, DAVID Treating Provider/Extender: STONE III, HOYT Weeks in Treatment: 19 Subjective Chief Complaint Information obtained from Patient Patient presents to the wound care center for a consult due non healing wound to the right lateral malleolus History of Present Illness (HPI) 84 year old patient who most recently has been seeing both podiatry and vascular surgery for a long-standing ulcer of her right lateral malleolus which has been treated with various methodologies. Dr. Amalia Hailey the podiatrist saw her on 07/20/2017 and sent her to the wound center for possible hyperbaric oxygen therapy. past medical history of peripheral vascular disease, varicose veins, status post appendectomy, basal cell carcinoma excision from the left leg, cholecystectomy, pacemaker placement, right lower extremity angiography done by Dr. dew in March 2017 with placement of a stent. there is also note of a successful ablation of the right small saphenous vein done which was reviewed by ultrasound on 10/24/2016. the patient had a right small saphenous vein ablation done on 10/20/2016. The patient has never been a smoker. She has been seen by Dr. Corene Cornea dew the vascular surgeon who most recently saw her on 06/15/2017 for evaluation of ongoing problems with right  leg swelling. She had a lower extremity arterial duplex examination done(02/13/17) which showed patent distal right superficial femoral artery stent and above-the-knee popliteal stent without evidence of restenosis. The ABI was more than 1.3 on the right and more than 1.3 on the left. This was consistent with noncompressible arteries due to medial calcification. The right great toe pressure and PPG waveforms are within normal limits and the left great toe pressure and PPG waveforms are decreased. he recommended she continue to wear her compression stockings and continue with elevation. She is scheduled to have a noninvasive arterial study in the near future 08/16/2017 -- had a lower extremity arterial duplex examination done which showed patent distal right superficial femoral artery stent and above-the-knee popliteal stent without evidence of restenosis. The ABI was more than 1.3 on the right and more than 1.3 on the left. This was consistent with noncompressible arteries due to medial calcification. The right great toe pressure and PPG waveforms are within normal limits and the left great toe pressure and PPG waveforms are decreased. the x-ray of the right ankle has not yet been done 08/24/2017 -- had a right ankle x-ray -- IMPRESSION:1.  No fracture, bone lesion or evidence of osteomyelitis. 2. Lateral soft tissue swelling with a soft tissue ulcer. she has not yet seen the vascular surgeon for review 08/31/17 on evaluation today patient's wound appears to be showing signs of improvement. She still with her appointment with vascular in order to review her results of her vascular study and then determine if any intervention would be recommended at that time. No fevers, chills, nausea, or vomiting noted at this time. She has been tolerating the dressing changes without complication. 09/28/17 on evaluation today patient's wound appears to show signs of good improvement in regard to the granulation  tissue which is surfacing. There is still a layer of slough covering the wound and the posterior portion is still significantly deeper than the anterior nonetheless there has been some good sign of things moving towards the better. She is going to go back to Dr. dew for reevaluation to ensure her blood flow is still appropriate. That will be before her next evaluation with [place] next week. No fevers, chills, nausea, or vomiting noted at this time. Patient does have some discomfort rated to be a 3-4/10 depending on activity specifically cleansing the wound makes it worse. 10/05/2017 -- the patient was seen by Dr. Lucky Cowboy last week and noninvasive studies showed a normal right ABI with brisk KAELYNN, IGO. ([id number]) triphasic waveforms consistent with no arterial insufficiency including normal digital pressures. The duplex showed a patent distal right SFA stent and the proximal SFA was also normal. He was pleased with her test and thought she should have enough of perfusion for normal wound healing. He would see her back in 6 months time. 12/21/17 on evaluation today patient appears to be doing fairly well in regard to her right lateral ankle wound. Unfortunately the main issue that she is expansion at this point is that she is having some issues with what appears to be some cellulitis in the right anterior shin. She has also been noting a little bit of uncomfortable feeling especially last night and her ankle area. I'm afraid that she made the developing a little bit of an infection. With that being said I think it is in the early stages. Patient History Information obtained from Patient. Family History Cancer - Father,Siblings, Heart Disease - Siblings, No family history of Diabetes, Hereditary Spherocytosis, Hypertension, Kidney Disease, Lung Disease, Seizures, Stroke, Thyroid Problems, Tuberculosis. Social History Never smoker, Marital Status - Married, Alcohol Use - Never, Drug Use - No  History, Caffeine Use - Rarely. Review of Systems (ROS) Constitutional Symptoms (General Health) Denies complaints or symptoms of Fever, Chills. Respiratory The patient has no complaints or symptoms. Cardiovascular Complains or has symptoms of LE edema. Psychiatric The patient has no complaints or symptoms. Objective Constitutional Well-nourished and well-hydrated in no acute distress. Vitals Time Taken: 12:46 PM, Height: 65 in, Weight: 154.3 lbs, BMI: 25.7, Temperature: 98.0 F, Pulse: 86 bpm, Respiratory Rate: 16 breaths/min, Blood Pressure: 125/68 mmHg. Respiratory normal breathing without difficulty. Psychiatric this patient is able to make decisions and demonstrates good insight into disease process. Alert and Oriented x 3. pleasant and cooperative. General Notes: Patient's wound bed shows some Slough covering especially in the surrounding peripheral edge of the wound although there did not appear to be any severe evidence of infection note purulent discharge and overall patient did not have any undue pain. She tolerated debridement very well and post debridement the wound appear to be very good and has Vincelette, [name] ([id number]) fielding quite significantly  in regard to the edge of the wound. Integumentary (Hair, Skin) Wound #1 status is Open. Original cause of wound was Gradually Appeared. The wound is located on the Right,Lateral Malleolus. The wound measures 1cm length x 0.8cm width x 0.1cm depth; 0.628cm^2 area and 0.063cm^3 volume. There is Fat Layer (Subcutaneous Tissue) Exposed exposed. There is no tunneling or undermining noted. There is a large amount of serous drainage noted. The wound margin is distinct with the outline attached to the wound base. There is medium (34-66%) red granulation within the wound bed. There is a medium (34-66%) amount of necrotic tissue within the wound bed including Adherent Slough. The periwound skin appearance exhibited: Rubor,  Erythema. The periwound skin appearance did not exhibit: Callus, Crepitus, Excoriation, Induration, Rash, Scarring, Dry/Scaly, Maceration, Atrophie Blanche, Cyanosis, Ecchymosis, Hemosiderin Staining, Mottled, Pallor. The surrounding wound skin color is noted with erythema which is circumferential. Periwound temperature was noted as No Abnormality. The periwound has tenderness on palpation. Assessment Active Problems ICD-10 L97.312 - 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d I70.233 - Atherosclerosis of native arteries of right leg with ulceration of ankle I89.0 - Lymphedema, not elsewhere classified B35.4 - Tinea corporis Procedures Wound #1 Pre-procedure diagnosis of Wound #1 is a Lymphedema located on the Right,Lateral Malleolus .Severity of Tissue Pre Debridement is: Fat layer exposed. There was a Skin/Subcutaneous Tissue Debridement ([id number]) debridement with total area of 0.8 sq cm performed by STONE III, HOYT E., PA-C. with the following instrument(s): Curette to remove Viable and Non-Viable tissue/material including Exudate, Fibrin/Slough, and Subcutaneous after achieving pain control using Lidocaine 4% Topical Solution. A time out was conducted at 13:08, prior to the start of the procedure. A Minimum amount of bleeding was controlled with Pressure. The procedure was tolerated well with a pain level of 0 throughout and a pain level of 0 following the procedure. Post Debridement Measurements: 1cm length x 0.8cm width x 0.2cm depth; 0.126cm^3 volume. Character of Wound/Ulcer Post Debridement requires further debridement. Severity of Tissue Post Debridement is: Fat layer exposed. Post procedure Diagnosis Wound #1: Same as Pre-Procedure Plan Wound Cleansing: Wound #1 Right,Lateral Malleolus: Clean wound with Normal Saline. Cleanse wound with mild soap and water May Shower, gently pat wound dry prior to applying new dressing. Anesthetic (add to Medication  List): KELCY, LAIBLE ([id number]) Wound #1 Right,Lateral Malleolus: Topical Lidocaine 4% cream applied to wound bed prior to debridement (In Clinic Only). Primary Wound Dressing: Wound #1 Right,Lateral Malleolus: Hydrafera Blue Secondary Dressing: Wound #1 Right,Lateral Malleolus: Dry Gauze Other - stretch net Dressing Change Frequency: Wound #1 Right,Lateral Malleolus: Change dressing every other day. Follow-up Appointments: Wound #1 Right,Lateral Malleolus: Return Appointment in 1 week. Edema Control: Wound #1 Right,Lateral Malleolus: Patient to wear own Juxtalite/Juzo compression garment. Elevate legs to the level of the heart and pump ankles as often as possible Off-Loading: Wound #1 Right,Lateral Malleolus: Turn and reposition every 2 hours Other: - do not put pressure on your right ankle Additional Orders / Instructions: Wound #1 Right,Lateral Malleolus: Increase protein intake. Other: - Add Vitamin C, Zinc, Vitamin A to your diet The following medication(s) was prescribed: lidocaine topical 4 % cream 1 1 cream topical was prescribed at facility doxycycline hyclate oral 100 mg capsule 1 1 capsule oral taken 2 times a day for 14 days. Take 1 hour before eating or 2 hours after eating starting 12/21/2017 I am going to recommend that we continue with the Current wound care measures as she seems to be doing  very well. I am going to suggest that we go ahead and place her on an antibiotic and since she does not appear to be allergic to it I'm going to suggest doxycycline is a good option for her at this time. Patient is in agreement with this plan. We will see were things stand in one weeks time. Please see above for specific wound care orders. We will see patient for re-evaluation in 1 week(s) here in the clinic. If anything worsens or changes patient will contact our office for additional recommendations. Electronic Signature(s) Signed: 12/24/2017 11:19:49 AM By: Worthy Keeler PA-C Entered By: Worthy Keeler on 12/21/2017 13:20:28 [name] ([id number]) -------------------------------------------------------------------------------- ROS/PFSH Details Patient Name: [name] Date of Service: 12/21/2017 12:30 PM Medical Record Number: [id number] Patient Account Number: [id number] Date of Birth/Sex: [birth year] (84 y.o. Female) Treating RN: Ahmed Prima Primary Care Provider: FITZGERALD, DAVID Other Clinician: Referring Provider: FITZGERALD, DAVID Treating Provider/Extender: STONE III, HOYT Weeks in Treatment: 19 Information Obtained From Patient Wound History Do you currently have one or more open woundso Yes How many open wounds do you currently haveo 1 Approximately how long have you had your woundso 2 yrs How have you been treating your wound(s) until nowo mupirocin, soaking in epsom salt Has your wound(s) ever healed and then re-openedo No Have you had any lab work done in the past montho No Have you tested positive for an antibiotic resistant organism (MRSA, VRE)o No Have you tested positive for osteomyelitis (bone infection)o No Have you had any tests for circulation on your legso Yes Who ordered the testo Dr. Lucky Cowboy [facility] Constitutional Symptoms (General Health) Complaints and Symptoms: Negative for: Fever; Chills Cardiovascular Complaints and Symptoms: Positive for: LE edema Medical History: Positive for: Congestive Heart Failure; Hypertension Eyes Medical History: Positive for: Cataracts - surgery Respiratory Complaints and Symptoms: No Complaints or Symptoms Musculoskeletal Medical History: Positive for: Osteoarthritis Neurologic Medical History: Positive for: Neuropathy Oncologic ALLENE, FURUYA ([id number]) Medical History: Negative for: Received Chemotherapy; Received Radiation Psychiatric Complaints and Symptoms: No Complaints or Symptoms HBO Extended History  Items Eyes: Cataracts Immunizations Pneumococcal Vaccine: Received Pneumococcal Vaccination: Yes Implantable Devices Family and Social History Cancer: Yes - Father,Siblings; Diabetes: No; Heart Disease: Yes - Siblings; Hereditary Spherocytosis: No; Hypertension: No; Kidney Disease: No; Lung Disease: No; Seizures: No; Stroke: No; Thyroid Problems: No; Tuberculosis: No; Never smoker; Marital Status - Married; Alcohol Use: Never; Drug Use: No History; Caffeine Use: Rarely; Financial Concerns: No; Food, Clothing or Shelter Needs: No; Support System Lacking: No; Transportation Concerns: No; Advanced Directives: No; Patient does not want information on Advanced Directives; Do not resuscitate: No; Living Will: Yes (Not Provided); Medical Power of Attorney: No Physician Affirmation I have reviewed and agree with the above information. Electronic Signature(s) Signed: 12/21/2017 5:30:48 PM By: Alric Quan Signed: 12/24/2017 11:19:49 AM By: Worthy Keeler PA-C Entered By: Worthy Keeler on 12/21/2017 13:17:56 Reever, [name] ([id number]) -------------------------------------------------------------------------------- SuperBill Details Patient Name: [name] Date of Service: 12/21/2017 Medical Record Number: [id number] Patient Account Number: [id number] Date of Birth/Sex: [birth year] (84 y.o. 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 I89.0 Lymphedema, not elsewhere classified B35.4 Tinea corporis Facility Procedures CPT4  Code: 04799872 Description: 15872 - DEB SUBQ TISSUE 20 SQ CM/< ICD-10 Diagnosis Description B61.848  Non-pressure chronic ulcer of right ankle with fat layer ex Modifier: posed Quantity: 1 Physician Procedures CPT4 Code: [id number] Description: 43200 - WC PHYS SUBQ TISS 20 SQ CM ICD-10 Diagnosis Description V79.444 Non-pressure chronic ulcer of right ankle with fat layer ex Modifier: posed Quantity: 1 Electronic Signature(s) Signed: 12/24/2017 11:19:49 AM By: Worthy Keeler PA-C Entered By: Worthy Keeler on 12/21/2017 13:20:41

## 2017-12-28 ENCOUNTER — Encounter: Payer: Medicare Other | Admitting: Physician Assistant

## 2017-12-28 DIAGNOSIS — I70233 Atherosclerosis of native arteries of right leg with ulceration of ankle: Secondary | ICD-10-CM | POA: Diagnosis not present

## 2018-01-01 NOTE — Progress Notes (Signed)
DEMECIA, NORTHWAY ([id number]) Visit Report for 12/28/2017 Chief Complaint Document Details Patient Name: [name], [name]. Date of Service: 12/28/2017 12:30 PM Medical Record Number: [id number] Patient Account Number: [id number] Date of Birth/Sex: [birth year] (84 y.o. Female) Treating RN: Ahmed Prima Primary Care Provider: FITZGERALD, DAVID Other Clinician: Referring Provider: FITZGERALD, DAVID Treating Provider/Extender: STONE III, HOYT Weeks in Treatment: 20 Information Obtained from: Patient Chief Complaint Patient presents to the wound care center for a consult due non healing wound to the right lateral malleolus Electronic Signature(s) Signed: 12/29/2017 12:23:43 AM By: Worthy Keeler PA-C Entered By: Worthy Keeler on 12/28/2017 12:58:27 Krantz, [name] ([id number]) -------------------------------------------------------------------------------- Debridement Details Patient Name: [name] Date of Service: 12/28/2017 12:30 PM Medical Record Number: [id number] Patient Account Number: [id number] Date of Birth/Sex: 1923/08/21 (84 y.o. Female) Treating RN: Ahmed Prima Primary Care Provider: FITZGERALD, DAVID Other Clinician: Referring Provider: FITZGERALD, DAVID Treating Provider/Extender: STONE III, HOYT Weeks in Treatment: 20 Debridement Performed for Wound #1 Right,Lateral Malleolus Assessment: Performed By: Physician STONE III, HOYT E., PA-C Debridement: Debridement Severity of Tissue Pre Fat layer exposed Debridement: Pre-procedure Verification/Time Yes - 12:59 Out Taken: Start Time: 13:00 Pain Control: Lidocaine 4% Topical Solution Level: Skin/Subcutaneous Tissue Total Area Debrided (L x W): 0.8 (cm) x 0.8 (cm) = 0.64 (cm) Tissue and other material Viable, Non-Viable, Exudate, Fibrin/Slough, Subcutaneous debrided: Instrument: Curette Bleeding: Minimum Hemostasis Achieved: Pressure End Time: 13:02 Procedural Pain: 0 Post Procedural Pain:  0 Response to Treatment: Procedure was tolerated well Post Debridement Measurements of Total Wound Length: (cm) 0.8 Width: (cm) 0.8 Depth: (cm) 0.2 Volume: (cm) 0.101 Character of Wound/Ulcer Post Debridement: Requires Further Debridement Severity of Tissue Post Debridement: Fat layer exposed Post Procedure Diagnosis Same as Pre-procedure Electronic Signature(s) Signed: 12/28/2017 5:25:50 PM By: Alric Quan Signed: 12/29/2017 12:23:43 AM By: Worthy Keeler PA-C Entered By: Alric Quan on 12/28/2017 13:02:51 Munroe, [name] ([id number]) -------------------------------------------------------------------------------- HPI Details Patient Name: [name] Date of Service: 12/28/2017 12:30 PM Medical Record Number: [id number] Patient Account Number: [id number] Date of Birth/Sex: Nov 09, 1923 (84 y.o. 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 Dr. Amalia Hailey the podiatrist saw her on 07/20/2017 and sent her to the wound center for possible hyperbaric oxygen therapy. past medical history of peripheral vascular disease, varicose veins, status post appendectomy, basal cell carcinoma excision from the left leg, cholecystectomy, pacemaker placement, right lower extremity angiography done by Dr. dew in March 2017 with placement of a stent. there is also note of a successful ablation of the right small saphenous vein done which was reviewed by ultrasound on 10/24/2016. the patient had a right small saphenous vein ablation done on 10/20/2016. The patient has never been a smoker. She has  been seen by Dr. Corene Cornea dew the vascular surgeon who most recently saw her on 06/15/2017 for evaluation of ongoing problems with right leg swelling. She had a lower extremity arterial duplex examination done(02/13/17) which showed patent distal right superficial femoral artery stent and above-the-knee popliteal stent without evidence of restenosis. The ABI was more than 1.3 on the right and more than 1.3 on the left. This was consistent with noncompressible arteries due to medial calcification. The right great toe pressure  and PPG waveforms are within normal limits and the left great toe pressure and PPG waveforms are decreased. he recommended she continue to wear her compression stockings and continue with elevation. She is scheduled to have a noninvasive arterial study in the near future 08/16/2017 -- had a lower extremity arterial duplex examination done which showed patent distal right superficial femoral artery stent and above-the-knee popliteal stent without evidence of restenosis. The ABI was more than 1.3 on the right and more than 1.3 on the left. This was consistent with noncompressible arteries due to medial calcification. The right great toe pressure and PPG waveforms are within normal limits and the left great toe pressure and PPG waveforms are decreased. the x-ray of the right ankle has not yet been done 08/24/2017 -- had a right ankle x-ray -- IMPRESSION:1. No fracture, bone lesion or evidence of osteomyelitis. 2. Lateral soft tissue swelling with a soft tissue ulcer. she has not yet seen the vascular surgeon for review 08/31/17 on evaluation today patient's wound appears to be showing signs of improvement. She still with her appointment with vascular in order to review her results of her vascular study and then determine if any intervention would be recommended at that time. No fevers, chills, nausea, or vomiting noted at this time. She has been tolerating the dressing changes  without complication. 09/28/17 on evaluation today patient's wound appears to show signs of good improvement in regard to the granulation tissue which is surfacing. There is still a layer of slough covering the wound and the posterior portion is still significantly deeper than the anterior nonetheless there has been some good sign of things moving towards the better. She is going to go back to Dr. dew for reevaluation to ensure her blood flow is still appropriate. That will be before her next evaluation with [place] next week. No fevers, chills, nausea, or vomiting noted at this time. Patient does have some discomfort rated to be a 3-4/10 depending on activity specifically cleansing the wound makes it worse. 10/05/2017 -- the patient was seen by Dr. Lucky Cowboy last week and noninvasive studies showed a normal right ABI with brisk triphasic waveforms consistent with no arterial insufficiency including normal digital pressures. The duplex showed a patent distal right SFA stent and the proximal SFA was also normal. He was pleased with her test and thought she should have enough of perfusion for normal wound healing. He would see her back in 6 months time. 12/21/17 on evaluation today patient appears to be doing fairly well in regard to her right lateral ankle wound. Unfortunately the main issue that she is expansion at this point is that she is having some issues with what appears to be some cellulitis in the SAKARI, RAISANEN. ([id number]) right anterior shin. She has also been noting a little bit of uncomfortable feeling especially last night and her ankle area. I'm afraid that she made the developing a little bit of an infection. With that being said I think it is in the early stages. 12/28/17 on evaluation today patient's ankle appears to be doing excellent. She's making good progress at this point the cellulitis seems to have improved after last week's evaluation. Overall she is having no significant  discomfort which is excellent news. She does have an appointment with Dr. dew on March 29, 2018 for reevaluation in regard to the stent he placed. She seems to have excellent blood flow in the right lower extremity. Electronic Signature(s) Signed: 12/29/2017 12:23:43 AM By: Worthy Keeler  PA-C Entered By: Worthy Keeler on 12/28/2017 13:08:09 Buescher, [name] ([id number]) -------------------------------------------------------------------------------- Physical Exam Details Patient Name: CHAYLEE, EHRSAM Date of Service: 12/28/2017 12:30 PM Medical Record Number: [id number] Patient Account Number: [id number] Date of Birth/Sex: Aug 04, 1923 (84 y.o. Female) Treating RN: Ahmed Prima Primary Care Provider: FITZGERALD, DAVID Other Clinician: Referring Provider: FITZGERALD, DAVID Treating Provider/Extender: STONE III, HOYT Weeks in Treatment: 57 Constitutional Well-nourished and well-hydrated in no acute distress. Respiratory normal breathing without difficulty. Psychiatric this patient is able to make decisions and demonstrates good insight into disease process. Alert and Oriented x 3. pleasant and cooperative. Notes Patient's wound bed shows some Slough covering especially in the perimeter as well is over the central part of the wound there is some hyper granulation still centrally. Nonetheless this appears to be filling in nicely. The slough was sharply debride it from the edges of the wound as well is cleaned off from the surface obviously this was much thicker on the edges then centrally. Nonetheless the wound appeared to be excellent in regard to the granulation quality post debridement. Electronic Signature(s) Signed: 12/29/2017 12:23:43 AM By: Worthy Keeler PA-C Entered By: Worthy Keeler on 12/28/2017 17:31:31 Baune, [name] ([id number]) -------------------------------------------------------------------------------- Physician Orders Details Patient Name: [name] Date of Service: 12/28/2017 12:30 PM Medical Record Number: [id number] Patient Account Number: [id number] Date of Birth/Sex: May 13, 1923 (84 y.o. 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 o Cleanse wound with mild soap and water o May Shower, gently pat wound dry prior to applying new dressing. Anesthetic (add to Medication List) Wound #1 Right,Lateral Malleolus o Topical Lidocaine 4% cream applied to wound bed prior to debridement (In Clinic Only). Primary Wound Dressing Wound #1 Right,Lateral Malleolus o Hydrafera Blue Secondary Dressing Wound #1 Right,Lateral Malleolus o Dry Gauze o Other - stretch net Dressing Change Frequency Wound #1 Right,Lateral Malleolus o Change dressing every other day. Follow-up Appointments Wound #1 Right,Lateral Malleolus o Return Appointment in 1 week. Edema Control Wound #1 Right,Lateral Malleolus o Patient to wear own Juxtalite/Juzo compression garment. o Elevate legs to the level of the heart and pump ankles as often as possible Off-Loading Freese, [name] ([id number]) Wound #1 Right,Lateral Malleolus o Turn and reposition every 2 hours o Other: - do not put pressure on your right ankle Additional Orders / Instructions Wound #1 Right,Lateral Malleolus o Increase protein intake. o Other: - Add Vitamin C, Zinc,  Vitamin A to your diet Patient Medications Allergies: sulfa, metronidazole, monistat Notifications Medication Indication Start End lidocaine DOSE 1 - topical 4 % cream - 1 cream topical Electronic Signature(s) Signed: 12/28/2017 5:25:50 PM By: Alric Quan Signed: 12/29/2017 12:23:43 AM By: Worthy Keeler PA-C Entered By: Alric Quan on 12/28/2017 13:03:27 Staton, [name] ([id number]) -------------------------------------------------------------------------------- Prescription 12/28/2017 Patient Name: [name] Provider: Worthy Keeler PA-C Date of Birth: 1923-02-12 NPI#: [id number] Sex: F DEA#: [id number] Phone #: [phone] License #: Patient Address: [address] [facility] [place], [place] [address] [place]  [address] [place], [place] [address] [phone] Allergies sulfa metronidazole monistat Medication Medication: Route: Strength: Form: lidocaine topical 4% cream Class: TOPICAL LOCAL ANESTHETICS Dose: Frequency / Time: Indication: 1 1 cream topical Number of Refills: Number of Units: 0 Generic Substitution: Start Date: End Date: Administered at [name]: Yes Time Administered: Time Discontinued: Note to Pharmacy: [facility]): Date(s): Electronic Signature(s) KYLIYAH, STIRN ([id number]) Signed: 12/28/2017 5:25:50 PM By: Alric Quan Signed: 12/29/2017 12:23:43 AM By: Worthy Keeler PA-C Entered By: Alric Quan on 12/28/2017 13:03:28 Quiros, [name] ([id number]) --------------------------------------------------------------------------------  Problem List Details Patient Name: [name] Date of Service: 12/28/2017 12:30 PM Medical Record Number: [id number] Patient Account Number: [id number] Date of Birth/Sex: 08/04/1923 (84 y.o. Female) Treating RN: Ahmed Prima Primary Care Provider: FITZGERALD,  DAVID Other Clinician: Referring Provider: FITZGERALD, DAVID Treating Provider/Extender: STONE III, HOYT Weeks in Treatment: 20 Active Problems ICD-10 Encounter Code Description Active Date Diagnosis L97.312 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d 08/10/2017 Yes I70.233 Atherosclerosis of native arteries of right leg with ulceration of ankle 08/10/2017 Yes I89.0 Lymphedema, not elsewhere classified 08/10/2017 Yes B35.4 Tinea corporis 09/28/2017 Yes Inactive Problems Resolved Problems Electronic Signature(s) Signed: 12/29/2017 12:23:43 AM By: Worthy Keeler PA-C Entered By: Worthy Keeler on 12/28/2017 12:58:20 Mcconaughey, [name] ([id number]) -------------------------------------------------------------------------------- Progress Note Details Patient Name: [name] Date of Service: 12/28/2017 12:30 PM Medical Record Number: [id number] Patient Account Number: [id number] Date of Birth/Sex: 10-19-1923 (84 y.o. Female) Treating RN: Ahmed Prima Primary Care Provider: FITZGERALD, DAVID Other Clinician: Referring Provider: FITZGERALD, DAVID Treating Provider/Extender: STONE III, HOYT Weeks in Treatment: 20 Subjective Chief Complaint Information obtained from Patient Patient presents to the wound care center for a consult due non healing wound to the right lateral malleolus History of Present Illness (HPI) 84 year old patient who most recently has been seeing both podiatry and vascular surgery for a long-standing ulcer of her right lateral malleolus which has been treated with various methodologies. Dr. Amalia Hailey the podiatrist saw her on 07/20/2017 and sent her to the wound center for possible hyperbaric oxygen therapy. past medical history of peripheral vascular disease, varicose veins, status post appendectomy, basal cell carcinoma excision from the left leg, cholecystectomy, pacemaker placement, right lower extremity angiography done by Dr. dew in March  2017 with placement of a stent. there is also note of a successful ablation of the right small saphenous vein done which was reviewed by ultrasound on 10/24/2016. the patient had a right small saphenous vein ablation done on 10/20/2016. The patient has never been a smoker. She has been seen by Dr. Corene Cornea dew the vascular surgeon who most recently saw her on 06/15/2017 for evaluation of ongoing problems with right leg swelling. She had a lower extremity arterial duplex examination done(02/13/17) which showed patent distal right superficial femoral artery stent and above-the-knee popliteal stent without evidence of restenosis. The ABI was more than 1.3 on the right and more than 1.3 on the left. This was consistent with noncompressible arteries due to medial calcification. The right great toe pressure and PPG waveforms are within normal limits and the left great toe pressure and PPG waveforms are decreased. he recommended she continue to wear her compression stockings and continue with elevation. She is scheduled to have a noninvasive arterial study in the near future 08/16/2017 -- had a lower extremity arterial duplex examination done which showed patent distal right superficial femoral artery stent and above-the-knee popliteal stent without evidence of restenosis. The ABI was more  than 1.3 on the right and more than 1.3 on the left. This was consistent with noncompressible arteries due to medial calcification. The right great toe pressure and PPG waveforms are within normal limits and the left great toe pressure and PPG waveforms are decreased. the x-ray of the right ankle has not yet been done 08/24/2017 -- had a right ankle x-ray -- IMPRESSION:1. No fracture, bone lesion or evidence of osteomyelitis. 2. Lateral soft tissue swelling with a soft tissue ulcer. she has not yet seen the vascular surgeon for review 08/31/17 on evaluation today patient's wound appears to be showing signs of improvement.  She still with her appointment with vascular in order to review her results of her vascular study and then determine if any intervention would be recommended at that time. No fevers, chills, nausea, or vomiting noted at this time. She has been tolerating the dressing changes without complication. 09/28/17 on evaluation today patient's wound appears to show signs of good improvement in regard to the granulation tissue which is surfacing. There is still a layer of slough covering the wound and the posterior portion is still significantly deeper than the anterior nonetheless there has been some good sign of things moving towards the better. She is going to go back to Dr. dew for reevaluation to ensure her blood flow is still appropriate. That will be before her next evaluation with [place] next week. No fevers, chills, nausea, or vomiting noted at this time. Patient does have some discomfort rated to be a 3-4/10 depending on activity specifically cleansing the wound makes it worse. 10/05/2017 -- the patient was seen by Dr. Lucky Cowboy last week and noninvasive studies showed a normal right ABI with brisk GLADA, WICKSTROM. ([id number]) triphasic waveforms consistent with no arterial insufficiency including normal digital pressures. The duplex showed a patent distal right SFA stent and the proximal SFA was also normal. He was pleased with her test and thought she should have enough of perfusion for normal wound healing. He would see her back in 6 months time. 12/21/17 on evaluation today patient appears to be doing fairly well in regard to her right lateral ankle wound. Unfortunately the main issue that she is expansion at this point is that she is having some issues with what appears to be some cellulitis in the right anterior shin. She has also been noting a little bit of uncomfortable feeling especially last night and her ankle area. I'm afraid that she made the developing a little bit of an infection. With that  being said I think it is in the early stages. 12/28/17 on evaluation today patient's ankle appears to be doing excellent. She's making good progress at this point the cellulitis seems to have improved after last week's evaluation. Overall she is having no significant discomfort which is excellent news. She does have an appointment with Dr. dew on March 29, 2018 for reevaluation in regard to the stent he placed. She seems to have excellent blood flow in the right lower extremity. Patient History Information obtained from Patient. Family History Cancer - Father,Siblings, Heart Disease - Siblings, No family history of Diabetes, Hereditary Spherocytosis, Hypertension, Kidney Disease, Lung Disease, Seizures, Stroke, Thyroid Problems, Tuberculosis. Social History Never smoker, Marital Status - Married, Alcohol Use - Never, Drug Use - No History, Caffeine Use - Rarely. Review of Systems (ROS) Constitutional Symptoms (General Health) Denies complaints or symptoms of Fever, Chills. Respiratory The patient has no complaints or symptoms. Cardiovascular The patient has no complaints or symptoms. Psychiatric  The patient has no complaints or symptoms. Objective Constitutional Well-nourished and well-hydrated in no acute distress. Vitals Time Taken: 12:59 PM, Height: 65 in, Weight: 154.3 lbs, BMI: 25.7, Temperature: 98.1 F, Pulse: 68 bpm, Respiratory Rate: 16 breaths/min, Blood Pressure: 117/56 mmHg. Respiratory normal breathing without difficulty. Psychiatric this patient is able to make decisions and demonstrates good insight into disease process. Alert and Oriented x 3. pleasant and cooperative. SHANORA, CHRISTENSEN ([id number]) General Notes: Patient's wound bed shows some Slough covering especially in the perimeter as well is over the central part of the wound there is some hyper granulation still centrally. Nonetheless this appears to be filling in nicely. The slough was sharply debride it  from the edges of the wound as well is cleaned off from the surface obviously this was much thicker on the edges then centrally. Nonetheless the wound appeared to be excellent in regard to the granulation quality post debridement. Integumentary (Hair, Skin) Wound #1 status is Open. Original cause of wound was Gradually Appeared. The wound is located on the Right,Lateral Malleolus. The wound measures 0.8cm length x 0.8cm width x 0.1cm depth; 0.503cm^2 area and 0.05cm^3 volume. There is Fat Layer (Subcutaneous Tissue) Exposed exposed. There is no tunneling or undermining noted. There is a large amount of serous drainage noted. The wound margin is distinct with the outline attached to the wound base. There is large (67-100%) red granulation within the wound bed. There is a small (1-33%) amount of necrotic tissue within the wound bed including Adherent Slough. The periwound skin appearance exhibited: Rubor, Erythema. The periwound skin appearance did not exhibit: Callus, Crepitus, Excoriation, Induration, Rash, Scarring, Dry/Scaly, Maceration, Atrophie Blanche, Cyanosis, Ecchymosis, Hemosiderin Staining, Mottled, Pallor. The surrounding wound skin color is noted with erythema which is circumferential. Periwound temperature was noted as No Abnormality. The periwound has tenderness on palpation. Assessment Active Problems ICD-10 L97.312 - 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d I70.233 - Atherosclerosis of native arteries of right leg with ulceration of ankle I89.0 - Lymphedema, not elsewhere classified B35.4 - Tinea corporis Procedures Wound #1 Pre-procedure diagnosis of Wound #1 is a Lymphedema located on the Right,Lateral Malleolus .Severity of Tissue Pre Debridement is: Fat layer exposed. There was a Skin/Subcutaneous Tissue Debridement ([id number]) debridement with total area of 0.64 sq cm performed by STONE III, HOYT E., PA-C. with the following instrument(s): Curette  to remove Viable and Non-Viable tissue/material including Exudate, Fibrin/Slough, and Subcutaneous after achieving pain control using Lidocaine 4% Topical Solution. A time out was conducted at 12:59, prior to the start of the procedure. A Minimum amount of bleeding was controlled with Pressure. The procedure was tolerated well with a pain level of 0 throughout and a pain level of 0 following the procedure. Post Debridement Measurements: 0.8cm length x 0.8cm width x 0.2cm depth; 0.101cm^3 volume. Character of Wound/Ulcer Post Debridement requires further debridement. Severity of Tissue Post Debridement is: Fat layer exposed. Post procedure Diagnosis Wound #1: Same as Pre-Procedure Plan Wound Cleansing: Bellarae, Lizer [name] ([id number]) Wound #1 Right,Lateral Malleolus: Clean wound with Normal Saline. Cleanse wound with mild soap and water May Shower, gently pat wound dry prior to applying new dressing. Anesthetic (add to Medication List): Wound #1 Right,Lateral Malleolus: Topical Lidocaine 4% cream applied to wound bed prior to debridement (In Clinic Only). Primary Wound Dressing: Wound #1 Right,Lateral Malleolus: Hydrafera Blue Secondary Dressing: Wound #1 Right,Lateral Malleolus: Dry Gauze Other - stretch net Dressing Change Frequency: Wound #1 Right,Lateral Malleolus: Change dressing every other day.  Follow-up Appointments: Wound #1 Right,Lateral Malleolus: Return Appointment in 1 week. Edema Control: Wound #1 Right,Lateral Malleolus: Patient to wear own Juxtalite/Juzo compression garment. Elevate legs to the level of the heart and pump ankles as often as possible Off-Loading: Wound #1 Right,Lateral Malleolus: Turn and reposition every 2 hours Other: - do not put pressure on your right ankle Additional Orders / Instructions: Wound #1 Right,Lateral Malleolus: Increase protein intake. Other: - Add Vitamin C, Zinc, Vitamin A to your diet The following medication(s) was  prescribed: lidocaine topical 4 % cream 1 1 cream topical was prescribed at facility I'm gonna recommend that we at this point continue with the [facility] Dressing as she seems to be making good progress. She is in agreement with this plan. She does have a fault appointment with Dr. dew on April 26 to reevaluate her blood flow although it appears that she is doing very well based on examination today. Please see above for specific wound care orders. We will see patient for re-evaluation in 1 week(s) here in the clinic. If anything worsens or changes patient will contact our office for additional recommendations. Electronic Signature(s) Signed: 12/29/2017 12:23:43 AM By: Worthy Keeler PA-C Entered By: Worthy Keeler on 12/28/2017 17:32:18 Lentsch, [name] ([id number]) -------------------------------------------------------------------------------- ROS/PFSH Details Patient Name: [name] Date of Service: 12/28/2017 12:30 PM Medical Record Number: [id number] Patient Account Number: [id number] Date of Birth/Sex: [birth year] (84 y.o. Female) Treating RN: Ahmed Prima Primary Care Provider: FITZGERALD, DAVID Other Clinician: Referring Provider: FITZGERALD, DAVID Treating Provider/Extender: STONE III, HOYT Weeks in Treatment: 20 Information Obtained From Patient Wound History Do you currently have one or more open woundso Yes How many open wounds do you currently haveo 1 Approximately how long have you had your woundso 2 yrs How have you been treating your wound(s) until nowo mupirocin, soaking in epsom salt Has your wound(s) ever healed and then re-openedo No Have you had any lab work done in the past montho No Have you tested positive for an antibiotic resistant organism (MRSA, VRE)o No Have you tested positive for osteomyelitis (bone infection)o No Have you had any tests for circulation on your legso Yes Who ordered the testo Dr. Lucky Cowboy Where was the test doneo  avvs Constitutional Symptoms (General Health) Complaints and Symptoms: Negative for: Fever; Chills Eyes Medical History: Positive for: Cataracts - surgery Respiratory Complaints and Symptoms: No Complaints or Symptoms Cardiovascular Complaints and Symptoms: No Complaints or Symptoms Medical History: Positive for: Congestive Heart Failure; Hypertension Musculoskeletal Medical History: Positive for: Osteoarthritis Neurologic Medical History: Positive for: Neuropathy Oncologic CLAUDETT, BAYLY ([id number]) Medical History: Negative for: Received Chemotherapy; Received Radiation Psychiatric Complaints and Symptoms: No Complaints or Symptoms HBO Extended History Items Eyes: Cataracts Immunizations Pneumococcal Vaccine: Received Pneumococcal Vaccination: Yes Implantable Devices Family and Social History Cancer: Yes - Father,Siblings; Diabetes: No; Heart Disease: Yes - Siblings; Hereditary Spherocytosis: No; Hypertension: No; Kidney Disease: No; Lung Disease: No; Seizures: No; Stroke: No; Thyroid Problems: No; Tuberculosis: No; Never smoker; Marital Status - Married; Alcohol Use: Never; Drug Use: No History; Caffeine Use: Rarely; Financial Concerns: No; Food, Clothing or Shelter Needs: No; Support System Lacking: No; Transportation Concerns: No; Advanced Directives: No; Patient does not want information on Advanced Directives; Do not resuscitate: No; Living Will: Yes (Not Provided); Medical Power of Attorney: No Physician Affirmation I have reviewed and agree with the above information. Electronic Signature(s) Signed: 12/29/2017 12:23:43 AM By: Worthy Keeler PA-C Signed: 12/31/2017 5:10:21 PM By: Alric Quan Entered By: Joaquim Lai  III, Margarita Grizzle on 12/28/2017 17:31:03 Lachapelle, [name] ([id number]) -------------------------------------------------------------------------------- SuperBill Details Patient Name: VALOREE, AGENT Date of Service: 12/28/2017 Medical Record  Number: [id number] Patient Account Number: [id number] Date of Birth/Sex: [birth year] (84 y.o. 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 I89.0 Lymphedema, not elsewhere classified B35.4 Tinea corporis Facility Procedures CPT4 Code: 62229798 Description: 92119 - DEB SUBQ TISSUE 20 SQ CM/< ICD-10 Diagnosis Description E17.408 Non-pressure chronic ulcer of right ankle with fat layer ex Modifier: posed Quantity: 1 Physician Procedures CPT4 Code: 1448185 Description: 63149 - WC PHYS SUBQ TISS 20 SQ CM ICD-10 Diagnosis Description F02.637 Non-pressure chronic ulcer of right ankle with fat layer ex Modifier: posed Quantity: 1 Electronic Signature(s) Signed: 12/29/2017 12:23:43 AM By: Worthy Keeler PA-C Entered By: Worthy Keeler on 12/28/2017 17:32:44

## 2018-01-01 NOTE — Progress Notes (Signed)
[name], [name] ([id number]) Visit Report for 12/28/2017 Arrival Information Details Patient Name: [name], [name] Date of Service: 12/28/2017 12:30 PM Medical Record Number: [id number] Patient Account Number: [id number] Date of Birth/Sex: 1923-10-10 (84 y.o. Female) Treating RN: Ahmed Prima Primary Care Juandaniel Manfredo: FITZGERALD, DAVID Other Clinician: Referring Zyire Eidson: FITZGERALD, DAVID Treating Cyntha Brickman/Extender: STONE III, HOYT Weeks in Treatment: 20 Visit Information History Since Last Visit All ordered tests and consults were completed: No Patient Arrived: Ambulatory Added or deleted any medications: No Arrival Time: 12:46 Any new allergies or adverse reactions: No Accompanied By: self Had a fall or experienced change in No Transfer Assistance: None activities of daily living that may affect Patient Identification Verified: Yes risk of falls: Secondary Verification Process Completed: Yes Signs or symptoms of abuse/neglect since last visito No Patient Requires Transmission-Based No Hospitalized since last visit: No Precautions: Has Dressing in Place as Prescribed: Yes Patient Has Alerts: No Pain Present Now: No Electronic Signature(s) Signed: 12/28/2017 5:25:50 PM By: Alric Quan Entered By: Alric Quan on 12/28/2017 12:47:14 Capriotti, [name] ([id number]) -------------------------------------------------------------------------------- Encounter Discharge Information Details Patient Name: [name] Date of Service: 12/28/2017 12:30 PM Medical Record Number: [id number] Patient Account Number: [id number] Date of Birth/Sex: March 01, 1923 (84 y.o. Female) Treating RN: Ahmed Prima Primary Care Tahjae Clausing: FITZGERALD, DAVID Other Clinician: Referring Natalija Mavis: FITZGERALD, DAVID Treating Kollen Armenti/Extender: STONE III, HOYT Weeks in Treatment: 20 Encounter Discharge Information Items Discharge Pain Level: 0 Discharge Condition: Stable Ambulatory  Status: Ambulatory Discharge Destination: Home Transportation: Private Auto Accompanied By: daughter Schedule Follow-up Appointment: Yes Medication Reconciliation completed and No provided to Patient/Care Maxen Rowland: Provided on Clinical Summary of Care: 12/28/2017 Form Type Recipient Paper Patient WS Electronic Signature(s) Signed: 01/01/2018 10:47:22 AM By: Ruthine Dose Entered By: Ruthine Dose on 12/28/2017 13:13:49 Siller, [name] ([id number]) -------------------------------------------------------------------------------- Lower Extremity Assessment Details Patient Name: [name] Date of Service: 12/28/2017 12:30 PM Medical Record Number: [id number] Patient Account Number: [id number] Date of Birth/Sex: [birth year] (84 y.o. Female) Treating RN: Ahmed Prima Primary Care Andreia Gandolfi: FITZGERALD, DAVID Other Clinician: Referring Theordore Cisnero: FITZGERALD, DAVID Treating Deissy Guilbert/Extender: STONE III, HOYT Weeks in Treatment: 20 Vascular Assessment Pulses: Dorsalis Pedis Palpable: [Right:Yes] Posterior Tibial Extremity colors, hair growth, and conditions: Extremity Color: [Right:Hyperpigmented] Temperature of Extremity: [Right:Warm] Capillary Refill: [Right:< 3 seconds] Toe Nail Assessment Left: Right: Thick: Yes Discolored: Yes Deformed: Yes Improper Length and Hygiene: Yes Electronic Signature(s) Signed: 12/28/2017 5:25:50 PM By: Alric Quan Entered By: Alric Quan on 12/28/2017 12:54:32 Pete, [name] ([id number]) -------------------------------------------------------------------------------- Multi Wound Chart Details Patient Name: [name] Date of Service: 12/28/2017 12:30 PM Medical Record Number: [id number] Patient Account Number: [id number] Date of Birth/Sex: [birth year] (84 y.o. Female) Treating RN: Ahmed Prima Primary Care Wang Granada: FITZGERALD, DAVID Other Clinician: Referring Didi Ganaway: FITZGERALD, DAVID Treating  Emiliano Welshans/Extender: STONE III, HOYT Weeks in Treatment: 54 Photos: [1:No Photos] [N/A:N/A] Wound Location: [1:Right Malleolus - Lateral] [N/A:N/A] Wounding Event: [1:Gradually Appeared] [N/A:N/A] Primary Etiology: [1:Lymphedema] [N/A:N/A] Secondary Etiology: [1:Arterial Insufficiency Ulcer] [N/A:N/A] Comorbid History: [1:Cataracts, Congestive Heart Failure, Hypertension, Osteoarthritis, Neuropathy] [N/A:N/A] Date Acquired: [1:08/11/2015] [N/A:N/A] Weeks of Treatment: [1:20] [N/A:N/A] Wound Status: [1:Open] [N/A:N/A] Measurements L x W x D [1:0.8x0.8x0.1] [N/A:N/A] (cm) Area (cm) : [1:0.503] [N/A:N/A] Volume (cm) : [1:0.05] [N/A:N/A] % Reduction in Area: [1:67.20%] [N/A:N/A] % Reduction in Volume: [1:89.10%] [N/A:N/A] Classification: [1:Full Thickness Without Exposed Support Structures] [N/A:N/A] Exudate Amount: [1:Large] [N/A:N/A] Exudate Type: [1:Serous] [N/A:N/A] Exudate Color: [1:amber] [N/A:N/A] Wound Margin: [1:Distinct, outline attached] [N/A:N/A] Granulation Amount: [1:Large (67-100%)] [N/A:N/A] Granulation Quality: [1:Red] [N/A:N/A] Necrotic Amount: [1:Small (1-33%)] [N/A:N/A]  Exposed Structures: [1:Fat Layer (Subcutaneous Tissue) Exposed: Yes] [N/A:N/A] Epithelialization: [1:None] [N/A:N/A] Periwound Skin Texture: [1:Excoriation: No Induration: No Callus: No Crepitus: No Rash: No Scarring: No] [N/A:N/A] Periwound Skin Moisture: [1:Maceration: No Dry/Scaly: No] [N/A:N/A] Periwound Skin Color: [1:Erythema: Yes Rubor: Yes Atrophie Blanche: No Cyanosis: No Ecchymosis: No Hemosiderin Staining: No Mottled: No Pallor: No] [N/A:N/A] Erythema Location: [1:Circumferential] [N/A:N/A] Temperature: No Abnormality N/A N/A Tenderness on Palpation: Yes N/A N/A Wound Preparation: Ulcer Cleansing: N/A N/A Rinsed/Irrigated with Saline Topical Anesthetic Applied: Other: lidocaine 4% Treatment Notes Electronic Signature(s) Signed: 12/28/2017 5:25:50 PM By: Alric Quan Entered  By: Alric Quan on 12/28/2017 12:54:56 Norling, [name] ([id number]) -------------------------------------------------------------------------------- [place] Details Patient Name: [name] Date of Service: 12/28/2017 12:30 PM Medical Record Number: [id number] Patient Account Number: [id number] Date of Birth/Sex: 07/14/1923 (84 y.o. Female) Treating RN: Ahmed Prima Primary Care Li Bobo: FITZGERALD, DAVID Other Clinician: Referring Ori Trejos: FITZGERALD, DAVID Treating Hermina Barnard/Extender: STONE III, HOYT Weeks in Treatment: 20 Active Inactive ` Abuse / Safety / Falls / Self Care Management Nursing Diagnoses: Potential for falls Goals: Patient will not experience any injury related to falls Date Initiated: 08/10/2017 Target Resolution Date: 11/10/2017 Goal Status: Active Interventions: Assess Activities of Daily Living upon admission and as needed Assess fall risk on admission and as needed Assess: immobility, friction, shearing, incontinence upon admission and as needed Notes: ` Nutrition Nursing Diagnoses: Imbalanced nutrition Potential for alteratiion in Nutrition/Potential for imbalanced nutrition Goals: Patient/caregiver agrees to and verbalizes understanding of need to use nutritional supplements and/or vitamins as prescribed Date Initiated: 08/10/2017 Target Resolution Date: 12/08/2017 Goal Status: Active Interventions: Assess patient nutrition upon admission and as needed per policy Notes: ` Orientation to the Wound Care Program Nursing Diagnoses: Knowledge deficit related to the wound healing center program Goals: Patient/caregiver will verbalize understanding of the [place] Program Date Initiated: 08/10/2017 Target Resolution Date: 09/08/2017 RILEIGH, KAWASHIMA ([id number]) Goal Status: Active Interventions: Provide education on orientation to the wound center Notes: ` Pain, Acute or Chronic Nursing  Diagnoses: Pain, acute or chronic: actual or potential Potential alteration in comfort, pain Goals: Patient/caregiver will verbalize adequate pain control between visits Date Initiated: 08/10/2017 Target Resolution Date: 12/08/2017 Goal Status: Active Interventions: Complete pain assessment as per visit requirements Notes: ` Wound/Skin Impairment Nursing Diagnoses: Impaired tissue integrity Knowledge deficit related to ulceration/compromised skin integrity Goals: Ulcer/skin breakdown will have a volume reduction of 80% by week 12 Date Initiated: 08/10/2017 Target Resolution Date: 12/01/2017 Goal Status: Active Interventions: Assess patient/caregiver ability to perform ulcer/skin care regimen upon admission and as needed Notes: Electronic Signature(s) Signed: 12/28/2017 5:25:50 PM By: Alric Quan Entered By: Alric Quan on 12/28/2017 12:54:47 Andreason, [name] ([id number]) -------------------------------------------------------------------------------- Pain Assessment Details Patient Name: [name] Date of Service: 12/28/2017 12:30 PM Medical Record Number: [id number] Patient Account Number: [id number] Date of Birth/Sex: November 17, 1923 (84 y.o. Female) Treating RN: Ahmed Prima Primary Care Agamjot Kilgallon: FITZGERALD, DAVID Other Clinician: Referring Hellen Shanley: FITZGERALD, DAVID Treating Vanessa Kampf/Extender: STONE III, HOYT Weeks in Treatment: 20 Active Problems Location of Pain Severity and Description of Pain Patient Has Paino No Site Locations Pain Management and Medication Current Pain Management: Electronic Signature(s) Signed: 12/28/2017 5:25:50 PM By: Alric Quan Entered By: Alric Quan on 12/28/2017 12:47:20 Demauro, [name] ([id number]) -------------------------------------------------------------------------------- Patient/Caregiver Education Details Patient Name: [name] Date of Service: 12/28/2017 12:30 PM Medical Record Number:  [id number] Patient Account Number: [id number] Date of Birth/Gender: 1922-12-12 (84 y.o. Female) Treating RN: Ahmed Prima Primary Care Physician: FITZGERALD, DAVID Other Clinician: Referring  Physician: FITZGERALD, DAVID Treating Physician/Extender: Melburn Hake, HOYT Weeks in Treatment: 20 Education Assessment Education Provided To: Patient Education Topics Provided Wound/Skin Impairment: Handouts: Caring for Your Ulcer, Other: change dressing as ordered Methods: Demonstration, Explain/Verbal Responses: State content correctly Electronic Signature(s) Signed: 12/28/2017 5:25:50 PM By: Alric Quan Entered By: Alric Quan on 12/28/2017 13:04:28 Gerdts, [name] ([id number]) -------------------------------------------------------------------------------- Wound Assessment Details Patient Name: [name] Date of Service: 12/28/2017 12:30 PM Medical Record Number: [id number] Patient Account Number: [id number] Date of Birth/Sex: [birth year] (84 y.o. Female) Treating RN: Ahmed Prima Primary Care Saajan Willmon: FITZGERALD, DAVID Other Clinician: Referring Makhi Muzquiz: FITZGERALD, DAVID Treating Deone Leifheit/Extender: STONE III, HOYT Weeks in Treatment: 20 Wound Status Wound Number: 1 Primary Lymphedema Etiology: Wound Location: Right Malleolus - Lateral Secondary Arterial Insufficiency Ulcer Wounding Event: Gradually Appeared Etiology: Date Acquired: 08/11/2015 Wound Status: Open Weeks Of Treatment: 20 Comorbid Cataracts, Congestive Heart Failure, Clustered Wound: No History: Hypertension, Osteoarthritis, Neuropathy Photos Photo Uploaded By: Alric Quan on 12/28/2017 16:59:24 Wound Measurements Length: (cm) 0.8 Width: (cm) 0.8 Depth: (cm) 0.1 Area: (cm) 0.503 Volume: (cm) 0.05 % Reduction in Area: 67.2% % Reduction in Volume: 89.1% Epithelialization: None Tunneling: No Undermining: No Wound Description Full Thickness Without Exposed  Support Classification: Structures Wound Margin: Distinct, outline attached Exudate Large Amount: Exudate Type: Serous Exudate Color: amber Foul Odor After Cleansing: No Slough/Fibrino Yes Wound Bed Granulation Amount: Large (67-100%) Exposed Structure Granulation Quality: Red Fat Layer (Subcutaneous Tissue) Exposed: Yes Necrotic Amount: Small (1-33%) Necrotic Quality: Adherent Slough Periwound Skin Texture Texture Color Hoagland, [name] ([id number]) No Abnormalities Noted: No No Abnormalities Noted: No Callus: No Atrophie Blanche: No Crepitus: No Cyanosis: No Excoriation: No Ecchymosis: No Induration: No Erythema: Yes Rash: No Erythema Location: Circumferential Scarring: No Hemosiderin Staining: No Mottled: No Moisture Pallor: No No Abnormalities Noted: No Rubor: Yes Dry / Scaly: No Maceration: No Temperature / Pain Temperature: No Abnormality Tenderness on Palpation: Yes Wound Preparation Ulcer Cleansing: Rinsed/Irrigated with Saline Topical Anesthetic Applied: Other: lidocaine 4%, Treatment Notes Wound #1 (Right, Lateral Malleolus) 1. Cleansed with: Clean wound with Normal Saline 2. Anesthetic Topical Lidocaine 4% cream to wound bed prior to debridement 4. Dressing Applied: Hydrafera Blue 5. Secondary Dressing Applied Dry Gauze 7. Secured with Tape Notes stretchnet, Juxtalite Electronic Signature(s) Signed: 12/28/2017 5:25:50 PM By: Alric Quan Entered By: Alric Quan on 12/28/2017 12:52:17 [name], [name] ([id number]) -------------------------------------------------------------------------------- [place] Details Patient Name: [name] Date of Service: 12/28/2017 12:30 PM Medical Record Number: [id number] Patient Account Number: [id number] Date of Birth/Sex: 06-Oct-1923 (84 y.o. Female) Treating RN: Ahmed Prima Primary Care Ahlana Slaydon: FITZGERALD, DAVID Other Clinician: Referring Arlean Thies: FITZGERALD, DAVID Treating  Azalyn Sliwa/Extender: STONE III, HOYT Weeks in Treatment: 20 Vital Signs Time Taken: 12:59 Temperature (F): 98.1 Height (in): 65 Pulse (bpm): 68 Weight (lbs): 154.3 Respiratory Rate (breaths/min): 16 Body Mass Index (BMI): 25.7 Blood Pressure (mmHg): 117/56 Reference Range: 80 - 120 mg / dl Electronic Signature(s) Signed: 12/28/2017 5:25:50 PM By: Alric Quan Entered By: Alric Quan on 12/28/2017 13:00:39

## 2018-01-04 ENCOUNTER — Encounter: Payer: Medicare Other | Admitting: Physician Assistant

## 2018-01-11 ENCOUNTER — Encounter: Payer: Medicare Other | Attending: Surgery | Admitting: Physician Assistant

## 2018-01-11 DIAGNOSIS — I110 Hypertensive heart disease with heart failure: Secondary | ICD-10-CM | POA: Insufficient documentation

## 2018-01-11 DIAGNOSIS — L97312 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d: Secondary | ICD-10-CM | POA: Insufficient documentation

## 2018-01-11 DIAGNOSIS — I509 Heart failure, unspecified: Secondary | ICD-10-CM | POA: Insufficient documentation

## 2018-01-11 DIAGNOSIS — I70233 Atherosclerosis of native arteries of right leg with ulceration of ankle: Secondary | ICD-10-CM | POA: Insufficient documentation

## 2018-01-11 DIAGNOSIS — M1990 Unspecified osteoarthritis, unspecified site: Secondary | ICD-10-CM | POA: Insufficient documentation

## 2018-01-11 DIAGNOSIS — Z85828 Personal history of other malignant neoplasm of skin: Secondary | ICD-10-CM | POA: Diagnosis not present

## 2018-01-11 DIAGNOSIS — B354 Tinea corporis: Secondary | ICD-10-CM | POA: Diagnosis not present

## 2018-01-11 DIAGNOSIS — Z79899 Other long term (current) drug therapy: Secondary | ICD-10-CM | POA: Insufficient documentation

## 2018-01-11 DIAGNOSIS — Z7982 Long term (current) use of aspirin: Secondary | ICD-10-CM | POA: Insufficient documentation

## 2018-01-11 DIAGNOSIS — Z950 Presence of cardiac pacemaker: Secondary | ICD-10-CM | POA: Insufficient documentation

## 2018-01-11 DIAGNOSIS — I890 Lymphedema, not elsewhere classified: Secondary | ICD-10-CM | POA: Diagnosis not present

## 2018-01-11 DIAGNOSIS — G629 Polyneuropathy, unspecified: Secondary | ICD-10-CM | POA: Insufficient documentation

## 2018-01-14 NOTE — Progress Notes (Signed)
[name], [name] ([id number]) Visit Report for 01/11/2018 Chief Complaint Document Details Patient Name: [name], [name]. Date of Service: 01/11/2018 12:30 PM Medical Record Number: [id number] Patient Account Number: [id number] Date of Birth/Sex: [birth year] (84 y.o. Female) Treating RN: Ahmed Prima Primary Care Provider: FITZGERALD, DAVID Other Clinician: Referring Provider: FITZGERALD, DAVID Treating Provider/Extender: STONE III, HOYT Weeks in Treatment: 22 Information Obtained from: Patient Chief Complaint Patient presents to the wound care center for a consult due non healing wound to the right lateral malleolus Electronic Signature(s) Signed: 01/14/2018 9:06:28 AM By: Worthy Keeler PA-C Entered By: Worthy Keeler on 01/11/2018 12:52:44 [name], [name] ([id number]) -------------------------------------------------------------------------------- HPI Details Patient Name: [name] Date of Service: 01/11/2018 12:30 PM Medical Record Number: [id number] Patient Account Number: [id number] Date of Birth/Sex: [birth year] (84 y.o. 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 Dr. Amalia Hailey the podiatrist saw her on 07/20/2017 and sent her to the wound center for possible hyperbaric oxygen therapy. past medical history of peripheral vascular disease, varicose veins, status post appendectomy, basal cell carcinoma excision from the left leg, cholecystectomy, pacemaker placement, right lower extremity angiography done by Dr. dew in March 2017 with placement of a stent. there is also note of a  successful ablation of the right small saphenous vein done which was reviewed by ultrasound on 10/24/2016. the patient had a right small saphenous vein ablation done on 10/20/2016. The patient has never been a smoker. She has been seen by Dr. Corene Cornea dew the vascular surgeon who most recently saw her on 06/15/2017 for evaluation of ongoing problems with right leg swelling. She had a lower extremity arterial duplex examination done(02/13/17) which showed patent distal right superficial femoral artery stent and above-the-knee popliteal stent without evidence of restenosis. The ABI was more than 1.3 on the right and more than 1.3 on the left. This was consistent with noncompressible arteries due to medial calcification. The right great toe pressure and PPG waveforms are within normal limits and the left great toe pressure and PPG waveforms are decreased. he recommended she continue to wear her compression stockings and continue with elevation. She is scheduled to have a noninvasive arterial study in the near future 08/16/2017 -- had a lower extremity arterial duplex examination done which showed patent distal right superficial femoral artery stent and above-the-knee popliteal stent without evidence of restenosis. The ABI was more than 1.3 on the right and more than 1.3 on the left. This was consistent with noncompressible arteries due to medial calcification. The right great toe pressure and PPG waveforms are within normal limits and the left great toe pressure and PPG waveforms are decreased. the x-ray of the right ankle has not yet been done 08/24/2017 -- had a right ankle x-ray -- IMPRESSION:1. No fracture, bone lesion or evidence of osteomyelitis. 2. Lateral soft tissue swelling with a soft tissue ulcer. she has not yet seen the vascular surgeon for review 08/31/17 on evaluation today patient's wound appears to be showing signs of improvement. She still with her appointment with vascular in order to  review her results of her vascular study and then determine if any intervention would be recommended at that time. No fevers, chills, nausea, or  vomiting noted at this time. She has been tolerating the dressing changes without complication. 09/28/17 on evaluation today patient's wound appears to show signs of good improvement in regard to the granulation tissue which is surfacing. There is still a layer of slough covering the wound and the posterior portion is still significantly deeper than the anterior nonetheless there has been some good sign of things moving towards the better. She is going to go back to Dr. dew for reevaluation to ensure her blood flow is still appropriate. That will be before her next evaluation with [place] next week. No fevers, chills, nausea, or vomiting noted at this time. Patient does have some discomfort rated to be a 3-4/10 depending on activity specifically cleansing the wound makes it worse. 10/05/2017 -- the patient was seen by Dr. Lucky Cowboy last week and noninvasive studies showed a normal right ABI with brisk triphasic waveforms consistent with no arterial insufficiency including normal digital pressures. The duplex showed a patent distal right SFA stent and the proximal SFA was also normal. He was pleased with her test and thought she should have enough of perfusion for normal wound healing. He would see her back in 6 months time. 12/21/17 on evaluation today patient appears to be doing fairly well in regard to her right lateral ankle wound. Unfortunately the main issue that she is expansion at this point is that she is having some issues with what appears to be some cellulitis in the [name], [name]. ([id number]) right anterior shin. She has also been noting a little bit of uncomfortable feeling especially last night and her ankle area. I'm afraid that she made the developing a little bit of an infection. With that being said I think it is in the early stages. 12/28/17 on  evaluation today patient's ankle appears to be doing excellent. She's making good progress at this point the cellulitis seems to have improved after last week's evaluation. Overall she is having no significant discomfort which is excellent news. She does have an appointment with Dr. dew on March 29, 2018 for reevaluation in regard to the stent he placed. She seems to have excellent blood flow in the right lower extremity. 01/11/18 on evaluation today patient appears to be doing excellent in regard to her right lateral ankle wound. She has been tolerating the dressing changes without complication. Fortunately this has filled in and an excellent fashion at this point. This does not appear to be any evidence of infection. She has good granulation and no debridement is required at this point. Electronic Signature(s) Signed: 01/14/2018 9:06:28 AM By: Worthy Keeler PA-C Entered By: Worthy Keeler on 01/11/2018 13:50:29 Moffitt, [name] ([id number]) -------------------------------------------------------------------------------- Physical Exam Details Patient Name: [name] Date of Service: 01/11/2018 12:30 PM Medical Record Number: [id number] Patient Account Number: [id number] Date of Birth/Sex: 09-Jan-1923 (84 y.o. Female) Treating RN: Ahmed Prima Primary Care Provider: FITZGERALD, DAVID Other Clinician: Referring Provider: FITZGERALD, DAVID Treating Provider/Extender: STONE III, HOYT Weeks in Treatment: 54 Constitutional Well-nourished and well-hydrated in no acute distress. Respiratory normal breathing without difficulty. clear to auscultation bilaterally. Cardiovascular regular rate and rhythm with normal S1, S2. Psychiatric this patient is able to make decisions and demonstrates good insight into disease process. Alert and Oriented x 3. pleasant and cooperative. Notes Patient's wound bed shows granulation coverage to route and this appears to be extremely healthy overall the  wound bed looks excellent. Electronic Signature(s) Signed: 01/14/2018 9:06:28 AM By: Worthy Keeler PA-C Entered By: Worthy Keeler  on 01/11/2018 13:51:16 Zachar, [name] ([id number]) -------------------------------------------------------------------------------- Physician Orders Details Patient Name: MYLEAH, CAVENDISH. Date of Service: 01/11/2018 12:30 PM Medical Record Number: [id number] Patient Account Number: [id number] Date of Birth/Sex: 1923/07/09 (84 y.o. 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 o Cleanse wound with mild soap and water o May Shower, gently pat wound dry prior to applying new dressing. Anesthetic (add to Medication List) Wound #1 Right,Lateral Malleolus o Topical Lidocaine 4% cream applied to wound bed prior to debridement (In Clinic Only). Primary Wound Dressing Wound #1 Right,Lateral Malleolus o Hydrafera Blue Secondary Dressing Wound #1 Right,Lateral Malleolus o Dry Gauze o Conform/Kerlix o Other - stretch net Dressing Change Frequency Wound #1 Right,Lateral Malleolus o Change dressing every other day. Follow-up Appointments Wound #1 Right,Lateral Malleolus o Return Appointment in 1 week. Edema Control Wound #1 Right,Lateral Malleolus o Patient to wear own Juxtalite/Juzo compression garment. o Elevate legs to the level of the heart and  pump ankles as often as possible Bajaj, [name] ([id number]) Off-Loading Wound #1 Right,Lateral Malleolus o Turn and reposition every 2 hours o Other: - do not put pressure on your right ankle Additional Orders / Instructions Wound #1 Right,Lateral Malleolus o Increase protein intake. o Other: - Add Vitamin C, Zinc, Vitamin A to your diet Patient Medications Allergies: sulfa, metronidazole, monistat Notifications Medication Indication Start End lidocaine DOSE 1 - topical 4 % cream - 1 cream topical Electronic Signature(s) Signed: 01/11/2018 4:52:03 PM By: Alric Quan Signed: 01/14/2018 9:06:28 AM By: Worthy Keeler PA-C Entered By: Alric Quan on 01/11/2018 13:01:11 Depriest, [name] ([id number]) -------------------------------------------------------------------------------- Prescription 01/11/2018 Patient Name: [name] Provider: Worthy Keeler PA-C Date of Birth: 05-27-1923 NPI#: [id number] Sex: F DEA#: [id number] Phone #: [phone] License #: Patient Address: [place], [place] [address] [address], [place], [place] [address] [phone] Allergies sulfa metronidazole monistat Medication Medication: Route: Strength: Form: lidocaine topical 4% cream Class: TOPICAL LOCAL ANESTHETICS Dose: Frequency / Time: Indication: 1 1 cream topical Number of Refills: Number of Units: 0 Generic Substitution: Start Date: End Date: Administered at [place]: Yes Time Administered: Time Discontinued: Note to Pharmacy: [facility]): Date(s): Electronic Signature(s) [name], [name] ([id number]) Signed: 01/11/2018 4:52:03 PM By: Alric Quan Signed: 01/14/2018 9:06:28 AM By: Worthy Keeler PA-C Entered By: Alric Quan on 01/11/2018 13:01:12 Erhardt, [name]  ([id number]) --------------------------------------------------------------------------------  Problem List Details Patient Name: [name] Date of Service: 01/11/2018 12:30 PM Medical Record Number: [id number] Patient Account Number: [id number] Date of Birth/Sex: 03/22/1923 (84 y.o. Female) Treating RN: Ahmed Prima Primary Care Provider: FITZGERALD, DAVID Other Clinician: Referring Provider: FITZGERALD, DAVID Treating Provider/Extender: STONE III, HOYT Weeks in Treatment: 22 Active Problems ICD-10 Encounter Code Description Active Date Diagnosis L97.312 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d 08/10/2017 Yes I70.233 Atherosclerosis of native arteries of right leg with ulceration of ankle 08/10/2017 Yes I89.0 Lymphedema, not elsewhere classified 08/10/2017 Yes B35.4 Tinea corporis 09/28/2017 Yes Inactive  Problems Resolved Problems Electronic Signature(s) Signed: 01/14/2018 9:06:28 AM By: Worthy Keeler PA-C Entered By: Worthy Keeler on 01/11/2018 12:52:36 Seaborn, [name] ([id number]) -------------------------------------------------------------------------------- Progress Note Details Patient Name: [name] Date of Service: 01/11/2018 12:30 PM Medical Record Number: [id number] Patient Account Number: [id number] Date of Birth/Sex: 10-Feb-1923 (84 y.o. Female) Treating RN: Ahmed Prima Primary Care Provider: FITZGERALD, DAVID Other Clinician: Referring Provider: FITZGERALD, DAVID Treating Provider/Extender: STONE III, HOYT Weeks in Treatment: 22 Subjective Chief Complaint Information obtained from Patient Patient presents to the wound care center for a consult due non healing wound to the right lateral malleolus History of Present Illness (HPI) 85 year old patient who most recently has been seeing both podiatry and vascular surgery for a long-standing ulcer of her right lateral malleolus which has been treated with various  methodologies. Dr. Amalia Hailey the podiatrist saw her on 07/20/2017 and sent her to the wound center for possible hyperbaric oxygen therapy. past medical history of peripheral vascular disease, varicose veins, status post appendectomy, basal cell carcinoma excision from the left leg, cholecystectomy, pacemaker placement, right lower extremity angiography done by Dr. dew in March 2017 with placement of a stent. there is also note of a successful ablation of the right small saphenous vein done which was reviewed by ultrasound on 10/24/2016. the patient had a right small saphenous vein ablation done on 10/20/2016. The patient has never been a smoker. She has been seen by Dr. Corene Cornea dew the vascular surgeon who most recently saw her on 06/15/2017 for evaluation of ongoing problems with right leg swelling. She had a lower extremity arterial duplex examination done(02/13/17) which showed patent distal right superficial femoral artery stent and above-the-knee popliteal stent without evidence of restenosis. The ABI was more than 1.3 on the right and more than 1.3 on the left. This was consistent with noncompressible arteries due to medial calcification. The right great toe pressure and PPG waveforms are within normal limits and the left great toe pressure and PPG waveforms are decreased. he recommended she continue to wear her compression stockings and continue with elevation. She is scheduled to have a noninvasive arterial study in the near future 08/16/2017 -- had a lower extremity arterial duplex examination done which showed patent distal right superficial femoral artery stent and above-the-knee popliteal stent without evidence of restenosis. The ABI was more than 1.3 on the right and more than 1.3 on the left. This was consistent with noncompressible arteries due to medial calcification. The right great toe pressure and PPG waveforms are within normal limits and the left great toe pressure and PPG waveforms  are decreased. the x-ray of the right ankle has not yet been done 08/24/2017 -- had a right ankle x-ray -- IMPRESSION:1. No fracture, bone lesion or evidence of osteomyelitis. 2. Lateral soft tissue swelling with a soft tissue ulcer. she has not yet seen the vascular surgeon for review 08/31/17 on evaluation today patient's wound appears to be showing signs of improvement. She still with her appointment with vascular in order to review her results of her vascular study and then determine if any intervention would be recommended at that time. No fevers, chills, nausea, or vomiting noted at this time. She has been tolerating the dressing changes without complication. 09/28/17 on evaluation today patient's wound appears to show signs of good improvement in regard to the granulation tissue which is surfacing. There is still a layer of slough covering the wound and the posterior portion is still significantly deeper than the anterior nonetheless there  has been some good sign of things moving towards the better. She is going to go back to Dr. dew for reevaluation to ensure her blood flow is still appropriate. That will be before her next evaluation with [place] next week. No fevers, chills, nausea, or vomiting noted at this time. Patient does have some discomfort rated to be a 3-4/10 depending on activity specifically cleansing the wound makes it worse. 10/05/2017 -- the patient was seen by Dr. Lucky Cowboy last week and noninvasive studies showed a normal right ABI with brisk [name], [name]. ([id number]) triphasic waveforms consistent with no arterial insufficiency including normal digital pressures. The duplex showed a patent distal right SFA stent and the proximal SFA was also normal. He was pleased with her test and thought she should have enough of perfusion for normal wound healing. He would see her back in 6 months time. 12/21/17 on evaluation today patient appears to be doing fairly well in regard to her  right lateral ankle wound. Unfortunately the main issue that she is expansion at this point is that she is having some issues with what appears to be some cellulitis in the right anterior shin. She has also been noting a little bit of uncomfortable feeling especially last night and her ankle area. I'm afraid that she made the developing a little bit of an infection. With that being said I think it is in the early stages. 12/28/17 on evaluation today patient's ankle appears to be doing excellent. She's making good progress at this point the cellulitis seems to have improved after last week's evaluation. Overall she is having no significant discomfort which is excellent news. She does have an appointment with Dr. dew on March 29, 2018 for reevaluation in regard to the stent he placed. She seems to have excellent blood flow in the right lower extremity. 01/11/18 on evaluation today patient appears to be doing excellent in regard to her right lateral ankle wound. She has been tolerating the dressing changes without complication. Fortunately this has filled in and an excellent fashion at this point. This does not appear to be any evidence of infection. She has good granulation and no debridement is required at this point. Patient History Information obtained from Patient. Family History Cancer - Father,Siblings, Heart Disease - Siblings, No family history of Diabetes, Hereditary Spherocytosis, Hypertension, Kidney Disease, Lung Disease, Seizures, Stroke, Thyroid Problems, Tuberculosis. Social History Never smoker, Marital Status - Married, Alcohol Use - Never, Drug Use - No History, Caffeine Use - Rarely. Review of Systems (ROS) Constitutional Symptoms (General Health) Denies complaints or symptoms of Fever, Chills. Respiratory The patient has no complaints or symptoms. Cardiovascular The patient has no complaints or symptoms. Psychiatric The patient has no complaints or  symptoms. Objective Constitutional Well-nourished and well-hydrated in no acute distress. Vitals Time Taken: 12:42 PM, Height: 65 in, Weight: 154.3 lbs, BMI: 25.7, Temperature: 98.2 F, Pulse: 88 bpm, Respiratory Rate: 16 breaths/min, Blood Pressure: 129/69 mmHg. Respiratory normal breathing without difficulty. clear to auscultation bilaterally. [name], [name] ([id number]) Cardiovascular regular rate and rhythm with normal S1, S2. Psychiatric this patient is able to make decisions and demonstrates good insight into disease process. Alert and Oriented x 3. pleasant and cooperative. General Notes: Patient's wound bed shows granulation coverage to route and this appears to be extremely healthy overall the wound bed looks excellent. Integumentary (Hair, Skin) Wound #1 status is Open. Original cause of wound was Gradually Appeared. The wound is located on the Right,Lateral Malleolus. The wound measures 0.8cm  length x 0.7cm width x 0.1cm depth; 0.44cm^2 area and 0.044cm^3 volume. There is Fat Layer (Subcutaneous Tissue) Exposed exposed. There is no tunneling or undermining noted. There is a large amount of serous drainage noted. The wound margin is distinct with the outline attached to the wound base. There is large (67-100%) red granulation within the wound bed. There is a small (1-33%) amount of necrotic tissue within the wound bed including Adherent Slough. The periwound skin appearance exhibited: Excoriation, Rubor, Erythema. The periwound skin appearance did not exhibit: Callus, Crepitus, Induration, Rash, Scarring, Dry/Scaly, Maceration, Atrophie Blanche, Cyanosis, Ecchymosis, Hemosiderin Staining, Mottled, Pallor. The surrounding wound skin color is noted with erythema which is circumferential. Periwound temperature was noted as No Abnormality. The periwound has tenderness on palpation. Assessment Active Problems ICD-10 L97.312 - Non-pressure chronic ulcer of right ankle with fat  layer exposed I70.233 - Atherosclerosis of native arteries of right leg with ulceration of ankle I89.0 - Lymphedema, not elsewhere classified B35.4 - Tinea corporis Plan Wound Cleansing: Wound #1 Right,Lateral Malleolus: Clean wound with Normal Saline. Cleanse wound with mild soap and water May Shower, gently pat wound dry prior to applying new dressing. Anesthetic (add to Medication List): Wound #1 Right,Lateral Malleolus: Topical Lidocaine 4% cream applied to wound bed prior to debridement (In Clinic Only). Primary Wound Dressing: Wound #1 Right,Lateral Malleolus: Hydrafera Blue Secondary Dressing: Wound #1 Right,Lateral Malleolus: Dry Gauze Conform/Kerlix Balow, [name] ([id number]) Other - stretch net Dressing Change Frequency: Wound #1 Right,Lateral Malleolus: Change dressing every other day. Follow-up Appointments: Wound #1 Right,Lateral Malleolus: Return Appointment in 1 week. Edema Control: Wound #1 Right,Lateral Malleolus: Patient to wear own Juxtalite/Juzo compression garment. Elevate legs to the level of the heart and pump ankles as often as possible Off-Loading: Wound #1 Right,Lateral Malleolus: Turn and reposition every 2 hours Other: - do not put pressure on your right ankle Additional Orders / Instructions: Wound #1 Right,Lateral Malleolus: Increase protein intake. Other: - Add Vitamin C, Zinc, Vitamin A to your diet The following medication(s) was prescribed: lidocaine topical 4 % cream 1 1 cream topical was prescribed at facility I am going to recommend that we continue with the Current wound care measures and she seems to be doing so well with this. Patient is in agreement with the plan. We will see were things stand in one weeks time. Please see above for specific wound care orders. We will see patient for re-evaluation in 1 week(s) here in the clinic. If anything worsens or changes patient will contact our office for additional  recommendations. Electronic Signature(s) Signed: 01/14/2018 9:06:28 AM By: Worthy Keeler PA-C Entered By: Worthy Keeler on 01/11/2018 13:51:36 Etheridge, [name] ([id number]) -------------------------------------------------------------------------------- ROS/PFSH Details Patient Name: [name] Date of Service: 01/11/2018 12:30 PM Medical Record Number: [id number] Patient Account Number: [id number] Date of Birth/Sex: [birth year] (84 y.o. Female) Treating RN: Ahmed Prima Primary Care Provider: FITZGERALD, DAVID Other Clinician: Referring Provider: FITZGERALD, DAVID Treating Provider/Extender: STONE III, HOYT Weeks in Treatment: 22 Information Obtained From Patient Wound History Do you currently have one or more open woundso Yes How many open wounds do you currently haveo 1 Approximately how long have you had your woundso 2 yrs How have you been treating your wound(s) until nowo mupirocin, soaking in epsom salt Has your wound(s) ever healed and then re-openedo No Have you had any lab work done in the past montho No Have you tested positive for an antibiotic resistant organism (MRSA, VRE)o No Have you tested positive for  osteomyelitis (bone infection)o No Have you had any tests for circulation on your legso Yes Who ordered the testo Dr. Lucky Cowboy [facility] test [facility] Constitutional Symptoms (General Health) Complaints and Symptoms: Negative for: Fever; Chills Eyes Medical History: Positive for: Cataracts - surgery Respiratory Complaints and Symptoms: No Complaints or Symptoms Cardiovascular Complaints and Symptoms: No Complaints or Symptoms Medical History: Positive for: Congestive Heart Failure; Hypertension Musculoskeletal Medical History: Positive for: Osteoarthritis Neurologic Medical History: Positive for: Neuropathy Oncologic [name], [name] ([id number]) Medical History: Negative for: Received Chemotherapy; Received  Radiation Psychiatric Complaints and Symptoms: No Complaints or Symptoms HBO Extended History Items Eyes: Cataracts Immunizations Pneumococcal Vaccine: Received Pneumococcal Vaccination: Yes Implantable Devices Family and Social History Cancer: Yes - Father,Siblings; Diabetes: No; Heart Disease: Yes - Siblings; Hereditary Spherocytosis: No; Hypertension: No; Kidney Disease: No; Lung Disease: No; Seizures: No; Stroke: No; Thyroid Problems: No; Tuberculosis: No; Never smoker; Marital Status - Married; Alcohol Use: Never; Drug Use: No History; Caffeine Use: Rarely; Financial Concerns: No; Food, Clothing or Shelter Needs: No; Support System Lacking: No; Transportation Concerns: No; Advanced Directives: No; Patient does not want information on Advanced Directives; Do not resuscitate: No; Living Will: Yes (Not Provided); Medical Power of Attorney: No Physician Affirmation I have reviewed and agree with the above information. Electronic Signature(s) Signed: 01/11/2018 4:52:03 PM By: Alric Quan Signed: 01/14/2018 9:06:28 AM By: Worthy Keeler PA-C Entered By: Worthy Keeler on 01/11/2018 13:50:58 [place], [name] ([id number]) -------------------------------------------------------------------------------- SuperBill Details Patient Name: [name] Date of Service: 01/11/2018 Medical Record Number: [id number] Patient Account Number: [id number] Date of Birth/Sex: 1923-10-16 (84 y.o. 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 I70.233 Atherosclerosis of native arteries of right leg with ulceration of ankle I89.0 Lymphedema, not elsewhere classified B35.4 Tinea corporis Facility Procedures CPT4 Code:  03704888 Description: 91694 - WOUND CARE VISIT-LEV 3 EST PT Modifier: Quantity: 1 Physician Procedures CPT4 Code: 5038882 Description: 80034 - WC PHYS LEVEL 3 - EST PT ICD-10 Diagnosis Description J17.915 Non-pressure chronic ulcer of right ankle with fat layer expos I70.233 Atherosclerosis of native arteries of right leg with ulceratio I89.0 Lymphedema, not elsewhere  classified B35.4 Tinea corporis Modifier: ed n of ankle Quantity: 1 Electronic Signature(s) Signed: 01/11/2018 4:39:36 PM By: Alric Quan Signed: 01/14/2018 9:06:28 AM By: Worthy Keeler PA-C Entered By: Alric Quan on 01/11/2018 16:39:36

## 2018-01-14 NOTE — Progress Notes (Signed)
SHANNEL, ZAHM ([id number]) Visit Report for 01/11/2018 Arrival Information Details Patient Name: [name], [name] Date of Service: 01/11/2018 12:30 PM Medical Record Number: [id number] Patient Account Number: [id number] Date of Birth/Sex: 1923-11-14 (84 y.o. Female) Treating RN: Ahmed Prima Primary Care Dwyne Hasegawa: FITZGERALD, DAVID Other Clinician: Referring Dewain Platz: FITZGERALD, DAVID Treating Donyel Nester/Extender: STONE III, HOYT Weeks in Treatment: 67 Visit Information History Since Last Visit All ordered tests and consults were completed: No Patient Arrived: Ambulatory Added or deleted any medications: No Arrival Time: 12:42 Any new allergies or adverse reactions: No Accompanied By: self Had a fall or experienced change in No Transfer Assistance: None activities of daily living that may affect Patient Identification Verified: Yes risk of falls: Secondary Verification Process Completed: Yes Signs or symptoms of abuse/neglect since last visito No Patient Requires Transmission-Based No Hospitalized since last visit: No Precautions: Has Dressing in Place as Prescribed: Yes Patient Has Alerts: No Pain Present Now: No Electronic Signature(s) Signed: 01/11/2018 4:52:03 PM By: Alric Quan Entered By: Alric Quan on 01/11/2018 12:42:26 Junkins, [name] ([id number]) -------------------------------------------------------------------------------- Clinic Level of Care Assessment Details Patient Name: [name] Date of Service: 01/11/2018 12:30 PM Medical Record Number: [id number] Patient Account Number: [id number] Date of Birth/Sex: Sep 10, 1923 (84 y.o. Female) Treating RN: Ahmed Prima Primary Care Nakira Litzau: FITZGERALD, DAVID Other Clinician: Referring Elgar Scoggins: FITZGERALD, DAVID Treating Mathew Storck/Extender: STONE III, HOYT Weeks in Treatment: 22 Clinic Level of Care Assessment Items TOOL 4 Quantity Score X - Use when only an EandM is performed on  FOLLOW-UP visit 1 0 ASSESSMENTS - Nursing Assessment / Reassessment X - Reassessment of Co-morbidities (includes updates in patient status) 1 10 X- 1 5 Reassessment of Adherence to Treatment Plan ASSESSMENTS - Wound and Skin Assessment / Reassessment X - Simple Wound Assessment / Reassessment - one wound 1 5 []  - 0 Complex Wound Assessment / Reassessment - multiple wounds []  - 0 Dermatologic / Skin Assessment (not related to wound area) ASSESSMENTS - Focused Assessment []  - Circumferential Edema Measurements - multi extremities 0 []  - 0 Nutritional Assessment / Counseling / Intervention []  - 0 Lower Extremity Assessment (monofilament, tuning fork, pulses) []  - 0 Peripheral Arterial Disease Assessment (using hand held doppler) ASSESSMENTS - Ostomy and/or Continence Assessment and Care []  - Incontinence Assessment and Management 0 []  - 0 Ostomy Care Assessment and Management (repouching, etc.) PROCESS - Coordination of Care X - Simple Patient / Family Education for ongoing care 1 15 []  - 0 Complex (extensive) Patient / Family Education for ongoing care []  - 0 Staff obtains Programmer, systems, Records, Test Results / Process Orders []  - 0 Staff telephones HHA, Nursing Homes / Clarify orders / etc []  - 0 Routine Transfer to another Facility (non-emergent condition) []  - 0 Routine Hospital Admission (non-emergent condition) []  - 0 New Admissions / Biomedical engineer / Ordering NPWT, Apligraf, etc. []  - 0 Emergency Hospital Admission (emergent condition) X- 1 10 Simple Discharge Coordination LORALYN, RACHEL ([id number]) []  - 0 Complex (extensive) Discharge Coordination PROCESS - Special Needs []  - Pediatric / Minor Patient Management 0 []  - 0 Isolation Patient Management []  - 0 Hearing / Language / Visual special needs []  - 0 Assessment of Community assistance (transportation, D/C planning, etc.) []  - 0 Additional assistance / Altered mentation []  - 0 Support  Surface(s) Assessment (bed, cushion, seat, etc.) INTERVENTIONS - Wound Cleansing / Measurement X - Simple Wound Cleansing - one wound 1 5 []  - 0 Complex Wound Cleansing - multiple wounds X- 1 5 Wound Imaging (photographs -  any number of wounds) []  - 0 Wound Tracing (instead of photographs) X- 1 5 Simple Wound Measurement - one wound []  - 0 Complex Wound Measurement - multiple wounds INTERVENTIONS - Wound Dressings X - Small Wound Dressing one or multiple wounds 1 10 []  - 0 Medium Wound Dressing one or multiple wounds []  - 0 Large Wound Dressing one or multiple wounds X- 1 5 Application of Medications - topical []  - 0 Application of Medications - injection INTERVENTIONS - Miscellaneous []  - External ear exam 0 []  - 0 Specimen Collection (cultures, biopsies, blood, body fluids, etc.) []  - 0 Specimen(s) / Culture(s) sent or taken to Lab for analysis []  - 0 Patient Transfer (multiple staff / Civil Service fast streamer / Similar devices) []  - 0 Simple Staple / Suture removal (25 or less) []  - 0 Complex Staple / Suture removal (26 or more) []  - 0 Hypo / Hyperglycemic Management (close monitor of Blood Glucose) []  - 0 Ankle / Brachial Index (ABI) - do not check if billed separately X- 1 5 Vital Signs [name], [name] ([id number]) Has the patient been seen at the hospital within the last three years: Yes Total Score: 80 Level Of Care: New/Established - Level 3 Electronic Signature(s) Signed: 01/11/2018 4:52:03 PM By: Alric Quan Entered By: Alric Quan on 01/11/2018 16:39:19 [name], [name] ([id number]) -------------------------------------------------------------------------------- Encounter Discharge Information Details Patient Name: [name] Date of Service: 01/11/2018 12:30 PM Medical Record Number: [id number] Patient Account Number: [id number] Date of Birth/Sex: 1923-05-24 (84 y.o. Female) Treating RN: Ahmed Prima Primary Care Admir Candelas: FITZGERALD, DAVID  Other Clinician: Referring Allysen Lazo: FITZGERALD, DAVID Treating Jaciel Diem/Extender: STONE III, HOYT Weeks in Treatment: 22 Encounter Discharge Information Items Discharge Pain Level: 0 Discharge Condition: Stable Ambulatory Status: Ambulatory Discharge Destination: Home Private Transportation: Auto Accompanied By: self Schedule Follow-up Appointment: Yes Medication Reconciliation completed and provided No to Patient/Care Tramell Piechota: Clinical Summary of Care: Provided Form Type Recipient Paper Patient ws Electronic Signature(s) Signed: 01/11/2018 1:10:51 PM By: Lorine Bears RCP, RRT, CHT Entered By: Lorine Bears on 01/11/2018 13:10:51 [place], [name] ([id number]) -------------------------------------------------------------------------------- Lower Extremity Assessment Details Patient Name: [name] Date of Service: 01/11/2018 12:30 PM Medical Record Number: [id number] Patient Account Number: [id number] Date of Birth/Sex: [birth year] (84 y.o. Female) Treating RN: Ahmed Prima Primary Care Lizania Bouchard: FITZGERALD, DAVID Other Clinician: Referring Raschelle Wisenbaker: FITZGERALD, DAVID Treating Wyvonne Carda/Extender: STONE III, HOYT Weeks in Treatment: 22 Vascular Assessment Pulses: Dorsalis Pedis Palpable: [Right:Yes] Posterior Tibial Extremity colors, hair growth, and conditions: Extremity Color: [Right:Hyperpigmented] Temperature of Extremity: [Right:Warm] Capillary Refill: [Right:< 3 seconds] Toe Nail Assessment Left: Right: Thick: Yes Discolored: Yes Deformed: Yes Improper Length and Hygiene: No Electronic Signature(s) Signed: 01/11/2018 4:52:03 PM By: Alric Quan Entered By: Alric Quan on 01/11/2018 12:51:22 [name], [name] ([id number]) -------------------------------------------------------------------------------- Multi Wound Chart Details Patient Name: [name] Date of Service: 01/11/2018 12:30 PM Medical Record  Number: [id number] Patient Account Number: [id number] Date of Birth/Sex: 14-Oct-1923 (84 y.o. Female) Treating RN: Ahmed Prima Primary Care Montae Stager: FITZGERALD, DAVID Other Clinician: Referring Kera Deacon: FITZGERALD, DAVID Treating Jezelle Gullick/Extender: STONE III, HOYT Weeks in Treatment: 22 Vital Signs Height(in): 65 Pulse(bpm): 88 Weight(lbs): 154.3 Blood Pressure(mmHg): 129/69 Body Mass Index(BMI): 26 Temperature(F): 98.2 Respiratory Rate 16 (breaths/min): Photos: [1:No Photos] [N/A:N/A] Wound Location: [1:Right Malleolus - Lateral] [N/A:N/A] Wounding Event: [1:Gradually Appeared] [N/A:N/A] Primary Etiology: [1:Lymphedema] [N/A:N/A] Secondary Etiology: [1:Arterial Insufficiency Ulcer] [N/A:N/A] Comorbid History: [1:Cataracts, Congestive Heart Failure, Hypertension, Osteoarthritis, Neuropathy] [N/A:N/A] Date Acquired: [1:08/11/2015] [N/A:N/A] Weeks of Treatment: [1:22] [N/A:N/A] Wound Status: [1:Open] [  N/A:N/A] Measurements L x W x D [1:0.8x0.7x0.1] [N/A:N/A] (cm) Area (cm) : [1:0.44] [N/A:N/A] Volume (cm) : [1:0.044] [N/A:N/A] % Reduction in Area: [1:71.30%] [N/A:N/A] % Reduction in Volume: [1:90.40%] [N/A:N/A] Classification: [1:Full Thickness Without Exposed Support Structures] [N/A:N/A] Exudate Amount: [1:Large] [N/A:N/A] Exudate Type: [1:Serous] [N/A:N/A] Exudate Color: [1:amber] [N/A:N/A] Wound Margin: [1:Distinct, outline attached] [N/A:N/A] Granulation Amount: [1:Large (67-100%)] [N/A:N/A] Granulation Quality: [1:Red] [N/A:N/A] Necrotic Amount: [1:Small (1-33%)] [N/A:N/A] Exposed Structures: [1:Fat Layer (Subcutaneous Tissue) Exposed: Yes] [N/A:N/A] Epithelialization: [1:None] [N/A:N/A] Periwound Skin Texture: [1:Excoriation: Yes Induration: No Callus: No Crepitus: No Rash: No Scarring: No] [N/A:N/A] Periwound Skin Moisture: [1:Maceration: No Dry/Scaly: No] [N/A:N/A] Periwound Skin Color: Erythema: Yes N/A N/A Rubor: Yes Atrophie Blanche: No Cyanosis:  No Ecchymosis: No Hemosiderin Staining: No Mottled: No Pallor: No Erythema Location: Circumferential N/A N/A Temperature: No Abnormality N/A N/A Tenderness on Palpation: Yes N/A N/A Wound Preparation: Ulcer Cleansing: N/A N/A Rinsed/Irrigated with Saline Topical Anesthetic Applied: Other: lidocaine 4% Treatment Notes Electronic Signature(s) Signed: 01/11/2018 4:52:03 PM By: Alric Quan Entered By: Alric Quan on 01/11/2018 12:57:54 [name], [name] ([id number]) -------------------------------------------------------------------------------- [place] Details Patient Name: [name] Date of Service: 01/11/2018 12:30 PM Medical Record Number: [id number] Patient Account Number: [id number] Date of Birth/Sex: 02/07/1923 (84 y.o. Female) Treating RN: Ahmed Prima Primary Care Suleyma Wafer: FITZGERALD, DAVID Other Clinician: Referring Brentley Landfair: FITZGERALD, DAVID Treating Jolyne Laye/Extender: STONE III, HOYT Weeks in Treatment: 22 Active Inactive ` Abuse / Safety / Falls / Self Care Management Nursing Diagnoses: Potential for falls Goals: Patient will not experience any injury related to falls Date Initiated: 08/10/2017 Target Resolution Date: 11/10/2017 Goal Status: Active Interventions: Assess Activities of Daily Living upon admission and as needed Assess fall risk on admission and as needed Assess: immobility, friction, shearing, incontinence upon admission and as needed Notes: ` Nutrition Nursing Diagnoses: Imbalanced nutrition Potential for alteratiion in Nutrition/Potential for imbalanced nutrition Goals: Patient/caregiver agrees to and verbalizes understanding of need to use nutritional supplements and/or vitamins as prescribed Date Initiated: 08/10/2017 Target Resolution Date: 12/08/2017 Goal Status: Active Interventions: Assess patient nutrition upon admission and as needed per policy Notes: ` Orientation to the Wound Care  Program Nursing Diagnoses: Knowledge deficit related to the wound healing center program Goals: Patient/caregiver will verbalize understanding of the [place] Program Date Initiated: 08/10/2017 Target Resolution Date: 09/08/2017 CHAITRA, MAST ([id number]) Goal Status: Active Interventions: Provide education on orientation to the wound center Notes: ` Pain, Acute or Chronic Nursing Diagnoses: Pain, acute or chronic: actual or potential Potential alteration in comfort, pain Goals: Patient/caregiver will verbalize adequate pain control between visits Date Initiated: 08/10/2017 Target Resolution Date: 12/08/2017 Goal Status: Active Interventions: Complete pain assessment as per visit requirements Notes: ` Wound/Skin Impairment Nursing Diagnoses: Impaired tissue integrity Knowledge deficit related to ulceration/compromised skin integrity Goals: Ulcer/skin breakdown will have a volume reduction of 80% by week 12 Date Initiated: 08/10/2017 Target Resolution Date: 12/01/2017 Goal Status: Active Interventions: Assess patient/caregiver ability to perform ulcer/skin care regimen upon admission and as needed Notes: Electronic Signature(s) Signed: 01/11/2018 4:52:03 PM By: Alric Quan Entered By: Alric Quan on 01/11/2018 12:51:57 [name], [name] ([id number]) -------------------------------------------------------------------------------- Pain Assessment Details Patient Name: [name] Date of Service: 01/11/2018 12:30 PM Medical Record Number: [id number] Patient Account Number: [id number] Date of Birth/Sex: 10/28/1923 (84 y.o. Female) Treating RN: Ahmed Prima Primary Care Mariaelena Cade: FITZGERALD, DAVID Other Clinician: Referring Idolina Mantell: FITZGERALD, DAVID Treating Saamir Armstrong/Extender: STONE III, HOYT Weeks in Treatment: 22 Active Problems Location of Pain Severity and Description of Pain Patient Has  Paino No Site Locations Pain Management and  Medication Current Pain Management: Electronic Signature(s) Signed: 01/11/2018 4:52:03 PM By: Alric Quan Entered By: Alric Quan on 01/11/2018 12:42:33 [name], [name] ([id number]) -------------------------------------------------------------------------------- Patient/Caregiver Education Details Patient Name: [name] Date of Service: 01/11/2018 12:30 PM Medical Record Number: [id number] Patient Account Number: [id number] Date of Birth/Gender: 18-Mar-1923 (84 y.o. Female) Treating RN: Ahmed Prima Primary Care Physician: FITZGERALD, DAVID Other Clinician: Referring Physician: FITZGERALD, DAVID Treating Physician/Extender: Melburn Hake, HOYT Weeks in Treatment: 22 Education Assessment Education Provided To: Patient Education Topics Provided Wound/Skin Impairment: Handouts: Caring for Your Ulcer, Other: change dressing as dressing as ordered Methods: Demonstration, Explain/Verbal Responses: State content correctly Electronic Signature(s) Signed: 01/11/2018 4:52:03 PM By: Alric Quan Entered By: Alric Quan on 01/11/2018 12:58:43 [name], [name] ([id number]) -------------------------------------------------------------------------------- Wound Assessment Details Patient Name: [name] Date of Service: 01/11/2018 12:30 PM Medical Record Number: [id number] Patient Account Number: [id number] Date of Birth/Sex: 10/10/1923 (84 y.o. Female) Treating RN: Ahmed Prima Primary Care Adela Esteban: FITZGERALD, DAVID Other Clinician: Referring Jahaziel Francois: FITZGERALD, DAVID Treating Tivon Lemoine/Extender: STONE III, HOYT Weeks in Treatment: 22 Wound Status Wound Number: 1 Primary Lymphedema Etiology: Wound Location: Right Malleolus - Lateral Secondary Arterial Insufficiency Ulcer Wounding Event: Gradually Appeared Etiology: Date Acquired: 08/11/2015 Wound Status: Open Weeks Of Treatment: 22 Comorbid Cataracts, Congestive Heart Failure, Clustered Wound:  No History: Hypertension, Osteoarthritis, Neuropathy Photos Photo Uploaded By: Alric Quan on 01/11/2018 16:51:35 Wound Measurements Length: (cm) 0.8 Width: (cm) 0.7 Depth: (cm) 0.1 Area: (cm) 0.44 Volume: (cm) 0.044 % Reduction in Area: 71.3% % Reduction in Volume: 90.4% Epithelialization: None Tunneling: No Undermining: No Wound Description Full Thickness Without Exposed Support Classification: Structures Wound Margin: Distinct, outline attached Exudate Large Amount: Exudate Type: Serous Exudate Color: amber Foul Odor After Cleansing: No Slough/Fibrino Yes Wound Bed Granulation Amount: Large (67-100%) Exposed Structure Granulation Quality: Red Fat Layer (Subcutaneous Tissue) Exposed: Yes Necrotic Amount: Small (1-33%) Necrotic Quality: Adherent Slough Periwound Skin Texture Texture Color [name], [name] ([id number]) No Abnormalities Noted: No No Abnormalities Noted: No Callus: No Atrophie Blanche: No Crepitus: No Cyanosis: No Excoriation: Yes Ecchymosis: No Induration: No Erythema: Yes Rash: No Erythema Location: Circumferential Scarring: No Hemosiderin Staining: No Mottled: No Moisture Pallor: No No Abnormalities Noted: No Rubor: Yes Dry / Scaly: No Maceration: No Temperature / Pain Temperature: No Abnormality Tenderness on Palpation: Yes Wound Preparation Ulcer Cleansing: Rinsed/Irrigated with Saline Topical Anesthetic Applied: Other: lidocaine 4%, Treatment Notes Wound #1 (Right, Lateral Malleolus) 1. Cleansed with: Clean wound with Normal Saline 2. Anesthetic Topical Lidocaine 4% cream to wound bed prior to debridement 4. Dressing Applied: Hydrafera Blue 5. Secondary Dressing Applied Dry Gauze Kerlix/Conform 7. Secured with Tape Notes stretchnet, Juxtalite Electronic Signature(s) Signed: 01/11/2018 4:52:03 PM By: Alric Quan Entered By: Alric Quan on 01/11/2018 12:50:39 [name], [name]  ([id number]) -------------------------------------------------------------------------------- [place] Details Patient Name: [name] Date of Service: 01/11/2018 12:30 PM Medical Record Number: [id number] Patient Account Number: [id number] Date of Birth/Sex: 1923/06/14 (84 y.o. Female) Treating RN: Ahmed Prima Primary Care Marcee Jacobs: FITZGERALD, DAVID Other Clinician: Referring Jonnathan Birman: FITZGERALD, DAVID Treating Jakhia Buxton/Extender: STONE III, HOYT Weeks in Treatment: 22 Vital Signs Time Taken: 12:42 Temperature (F): 98.2 Height (in): 65 Pulse (bpm): 88 Weight (lbs): 154.3 Respiratory Rate (breaths/min): 16 Body Mass Index (BMI): 25.7 Blood Pressure (mmHg): 129/69 Reference Range: 80 - 120 mg / dl Electronic Signature(s) Signed: 01/11/2018 4:52:03 PM By: Alric Quan Entered By: Alric Quan on 01/11/2018 12:44:35

## 2018-01-18 ENCOUNTER — Encounter: Payer: Medicare Other | Attending: Physician Assistant | Admitting: Physician Assistant

## 2018-01-18 DIAGNOSIS — I890 Lymphedema, not elsewhere classified: Secondary | ICD-10-CM | POA: Insufficient documentation

## 2018-01-18 DIAGNOSIS — B354 Tinea corporis: Secondary | ICD-10-CM | POA: Insufficient documentation

## 2018-01-18 DIAGNOSIS — Z85828 Personal history of other malignant neoplasm of skin: Secondary | ICD-10-CM | POA: Diagnosis not present

## 2018-01-18 DIAGNOSIS — I110 Hypertensive heart disease with heart failure: Secondary | ICD-10-CM | POA: Insufficient documentation

## 2018-01-18 DIAGNOSIS — I509 Heart failure, unspecified: Secondary | ICD-10-CM | POA: Diagnosis not present

## 2018-01-18 DIAGNOSIS — I70233 Atherosclerosis of native arteries of right leg with ulceration of ankle: Secondary | ICD-10-CM | POA: Diagnosis present

## 2018-01-18 DIAGNOSIS — L97312 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d: Secondary | ICD-10-CM | POA: Diagnosis not present

## 2018-01-21 NOTE — Progress Notes (Signed)
[name], [name] ([id number]) Visit Report for 01/18/2018 Chief Complaint Document Details Patient Name: [name], [name]. Date of Service: 01/18/2018 12:30 PM Medical Record Number: [id number] Patient Account Number: [id number] Date of Birth/Sex: [birth year] (84 y.o. Female) Treating RN: Ahmed Prima Primary Care Provider: FITZGERALD, DAVID Other Clinician: Referring Provider: FITZGERALD, DAVID Treating Provider/Extender: STONE III, Elizandro Laura Weeks in Treatment: 23 Information Obtained from: Patient Chief Complaint Patient presents to the wound care center for a consult due non healing wound to the right lateral malleolus Electronic Signature(s) Signed: 01/21/2018 8:04:39 AM By: Worthy Keeler PA-C Entered By: Worthy Keeler on 01/18/2018 12:52:49 [place], [name] ([id number]) -------------------------------------------------------------------------------- HPI Details Patient Name: [name] Date of Service: 01/18/2018 12:30 PM Medical Record Number: [id number] Patient Account Number: [id number] Date of Birth/Sex: 13-Nov-1923 (84 y.o. 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 Dr. Amalia Hailey the podiatrist saw her on 07/20/2017 and sent her to the wound center for possible hyperbaric oxygen therapy. past medical history of peripheral vascular disease, varicose veins, status post appendectomy, basal cell carcinoma excision from the left leg, cholecystectomy, pacemaker placement, right lower extremity angiography done by Dr. dew in March 2017 with placement of a stent. there is also note  of a successful ablation of the right small saphenous vein done which was reviewed by ultrasound on 10/24/2016. the patient had a right small saphenous vein ablation done on 10/20/2016. The patient has never been a smoker. She has been seen by Dr. Corene Cornea dew the vascular surgeon who most recently saw her on 06/15/2017 for evaluation of ongoing problems with right leg swelling. She had a lower extremity arterial duplex examination done(02/13/17) which showed patent distal right superficial femoral artery stent and above-the-knee popliteal stent without evidence of restenosis. The ABI was more than 1.3 on the right and more than 1.3 on the left. This was consistent with noncompressible arteries due to medial calcification. The right great toe pressure and PPG waveforms are within normal limits and the left great toe pressure and PPG waveforms are decreased. he recommended she continue to wear her compression stockings and continue with elevation. She is scheduled to have a noninvasive arterial study in the near future 08/16/2017 -- had a lower extremity arterial duplex examination done which showed patent distal right superficial femoral artery stent and above-the-knee popliteal stent without evidence of restenosis. The ABI was more than 1.3 on the right and more than 1.3 on the left. This was consistent with noncompressible arteries due to medial calcification. The right great toe pressure and PPG waveforms are within normal limits and the left great toe pressure and PPG waveforms are decreased. the x-ray of the right ankle has not yet been done 08/24/2017 -- had a right ankle x-ray -- IMPRESSION:1. No fracture, bone lesion or evidence of osteomyelitis. 2. Lateral soft tissue swelling with a soft tissue ulcer. she has not yet seen the vascular surgeon for review 08/31/17 on evaluation today patient's wound appears to be showing signs of improvement. She still with her appointment with vascular in  order to review her results of her vascular study and then determine if any intervention would be recommended at that time. No fevers, chills, nausea, or  vomiting noted at this time. She has been tolerating the dressing changes without complication. 09/28/17 on evaluation today patient's wound appears to show signs of good improvement in regard to the granulation tissue which is surfacing. There is still a layer of slough covering the wound and the posterior portion is still significantly deeper than the anterior nonetheless there has been some good sign of things moving towards the better. She is going to go back to Dr. dew for reevaluation to ensure her blood flow is still appropriate. That will be before her next evaluation with [place] next week. No fevers, chills, nausea, or vomiting noted at this time. Patient does have some discomfort rated to be a 3-4/10 depending on activity specifically cleansing the wound makes it worse. 10/05/2017 -- the patient was seen by Dr. Lucky Cowboy last week and noninvasive studies showed a normal right ABI with brisk triphasic waveforms consistent with no arterial insufficiency including normal digital pressures. The duplex showed a patent distal right SFA stent and the proximal SFA was also normal. He was pleased with her test and thought she should have enough of perfusion for normal wound healing. He would see her back in 6 months time. 12/21/17 on evaluation today patient appears to be doing fairly well in regard to her right lateral ankle wound. Unfortunately the main issue that she is expansion at this point is that she is having some issues with what appears to be some cellulitis in the CHRISTY, EHRSAM. ([id number]) right anterior shin. She has also been noting a little bit of uncomfortable feeling especially last night and her ankle area. I'm afraid that she made the developing a little bit of an infection. With that being said I think it is in the early  stages. 12/28/17 on evaluation today patient's ankle appears to be doing excellent. She's making good progress at this point the cellulitis seems to have improved after last week's evaluation. Overall she is having no significant discomfort which is excellent news. She does have an appointment with Dr. dew on March 29, 2018 for reevaluation in regard to the stent he placed. She seems to have excellent blood flow in the right lower extremity. 01/19/12 on evaluation today patient's wound appears to be doing very well. In fact she does not appear to require debridement at this point, there's no evidence of infection, and overall from the standpoint of the wound she seems to be doing very well. With that being said I believe that it may be time to switch to different dressing away from the [facility] Dressing she tells me she does have a lot going on her friend actually passed away yesterday and she's also having a lot of issues with her husband this obviously is weighing heavy on her as far as your thoughts and concerns today. Electronic Signature(s) Signed: 01/21/2018 8:04:39 AM By: Worthy Keeler PA-C Entered By: Worthy Keeler on 01/18/2018 13:32:19 [place], [name] ([id number]) -------------------------------------------------------------------------------- Physical Exam Details Patient Name: [name] Date of Service: 01/18/2018 12:30 PM Medical Record Number: [id number] Patient Account Number: [id number] Date of Birth/Sex: January 21, 1923 (84 y.o. Female) Treating RN: Ahmed Prima Primary Care Provider: FITZGERALD, DAVID Other Clinician: Referring Provider: FITZGERALD, DAVID Treating Provider/Extender: STONE III, Jasn Xia Weeks in Treatment: 87 Constitutional Well-nourished and well-hydrated in no acute distress. Respiratory normal breathing without difficulty. clear to auscultation bilaterally. Cardiovascular regular rate and rhythm with normal S1, S2. Psychiatric this  patient is able to make decisions and demonstrates good insight into disease  process. Alert and Oriented x 3. pleasant and cooperative. Notes Patientos wound bed appears to show evidence of good granulation at this point there does not appear to be any evidence of significant hyper granulation which is good. I'm gonna recommend switching away from the [facility] Dressing in favor of Prisma at this point. Electronic Signature(s) Signed: 01/21/2018 8:04:39 AM By: Worthy Keeler PA-C Entered By: Worthy Keeler on 01/18/2018 13:33:10 [place], [name] ([id number]) -------------------------------------------------------------------------------- Physician Orders Details Patient Name: [name] Date of Service: 01/18/2018 12:30 PM Medical Record Number: [id number] Patient Account Number: [id number] Date of Birth/Sex: July 27, 1923 (84 y.o. 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 o Cleanse wound with mild soap and water o May Shower, gently pat wound dry prior to applying new dressing. Anesthetic (add to Medication List) Wound #1 Right,Lateral Malleolus o Topical Lidocaine 4% cream applied to wound bed prior to debridement (In Clinic Only). Primary Wound Dressing Wound #1 Right,Lateral Malleolus o Prisma Ag Secondary Dressing Wound #1 Right,Lateral Malleolus o Dry Gauze o Conform/Kerlix o Other - stretch net Dressing Change  Frequency Wound #1 Right,Lateral Malleolus o Change dressing every other day. Follow-up Appointments Wound #1 Right,Lateral Malleolus o Return Appointment in 1 week. Edema Control Wound #1 Right,Lateral Malleolus o Patient to wear own Juxtalite/Juzo compression garment. o Elevate legs to the level of the heart and pump ankles as often as possible [place], [name] ([id number]) Off-Loading Wound #1 Right,Lateral Malleolus o Turn and reposition every 2 hours o Other: - do not put pressure on your right ankle Additional Orders / Instructions Wound #1 Right,Lateral Malleolus o Increase protein intake. o Other: - Add Vitamin C, Zinc, Vitamin A to your diet Patient Medications Allergies: sulfa, metronidazole, monistat Notifications Medication Indication Start End lidocaine DOSE 1 - topical 4 % cream - 1 cream topical Electronic Signature(s) Signed: 01/18/2018 4:55:35 PM By: Alric Quan Signed: 01/21/2018 8:04:39 AM By: Worthy Keeler PA-C Entered By: Alric Quan on 01/18/2018 12:56:53 [place], [name] ([id number]) -------------------------------------------------------------------------------- Prescription 01/18/2018 Patient Name: [name]. Provider: Worthy Keeler PA-C Date of Birth: 1923-03-19 NPI#: [id number] Sex: F DEA#: [id number] Phone #: [phone] License #: Patient Address: [place], [place] [address] [address], [place], [place] [address] [phone] Allergies sulfa metronidazole monistat Medication Medication: Route: Strength: Form: lidocaine topical 4% cream Class: TOPICAL LOCAL ANESTHETICS Dose: Frequency / Time: Indication: 1 1 cream topical Number of Refills: Number of Units: 0 Generic Substitution: Start Date: End Date: Administered at [place]: Yes Time Administered: Time  Discontinued: Note to Pharmacy: [facility]): Date(s): Electronic Signature(s) ALEXINA, NICCOLI ([id number]) Signed: 01/18/2018 4:55:35 PM By: Alric Quan Signed: 01/21/2018 8:04:39 AM By: Worthy Keeler PA-C Entered By: Alric Quan on 01/18/2018 12:56:54 [place], [name] ([id number]) --------------------------------------------------------------------------------  Problem List Details Patient Name: [name] Date of Service: 01/18/2018 12:30 PM Medical Record Number: [id number] Patient Account Number: [id number] Date of Birth/Sex: 1923/11/13 (84 y.o. Female) Treating RN: Ahmed Prima Primary Care Provider: FITZGERALD, DAVID Other  Clinician: Referring Provider: FITZGERALD, DAVID Treating Provider/Extender: STONE III, Shauntee Karp Weeks in Treatment: 23 Active Problems ICD-10 Encounter Code Description Active Date Diagnosis L97.312 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d 08/10/2017 Yes I70.233 Atherosclerosis of native arteries of right leg with ulceration of ankle 08/10/2017 Yes I89.0 Lymphedema, not elsewhere classified 08/10/2017 Yes B35.4 Tinea corporis 09/28/2017 Yes Inactive Problems Resolved Problems Electronic Signature(s) Signed: 01/21/2018 8:04:39 AM By: Worthy Keeler PA-C Entered By: Worthy Keeler on 01/18/2018 13:31:47 [place], [name] ([id number]) -------------------------------------------------------------------------------- Progress Note Details Patient Name: [name] Date of Service: 01/18/2018 12:30 PM Medical Record Number: [id number] Patient Account Number: [id number] Date of Birth/Sex: 03-21-1923 (84 y.o. Female) Treating RN: Ahmed Prima Primary Care Provider: FITZGERALD, DAVID Other Clinician: Referring Provider: FITZGERALD, DAVID Treating Provider/Extender: STONE III, Kensleigh Gates Weeks in Treatment: 23 Subjective Chief Complaint Information obtained from Patient Patient presents to the wound care  center for a consult due non healing wound to the right lateral malleolus History of Present Illness (HPI) 84 year old patient who most recently has been seeing both podiatry and vascular surgery for a long-standing ulcer of her right lateral malleolus which has been treated with various methodologies. Dr. Amalia Hailey the podiatrist saw her on 07/20/2017 and sent her to the wound center for possible hyperbaric oxygen therapy. past medical history of peripheral vascular disease, varicose veins, status post appendectomy, basal cell carcinoma excision from the left leg, cholecystectomy, pacemaker placement, right lower extremity angiography done by Dr. dew in March 2017 with placement of a stent. there is also note of a successful ablation of the right small saphenous vein done which was reviewed by ultrasound on 10/24/2016. the patient had a right small saphenous vein ablation done on 10/20/2016. The patient has never been a smoker. She has been seen by Dr. Corene Cornea dew the vascular surgeon who most recently saw her on 06/15/2017 for evaluation of ongoing problems with right leg swelling. She had a lower extremity arterial duplex examination done(02/13/17) which showed patent distal right superficial femoral artery stent and above-the-knee popliteal stent without evidence of restenosis. The ABI was more than 1.3 on the right and more than 1.3 on the left. This was consistent with noncompressible arteries due to medial calcification. The right great toe pressure and PPG waveforms are within normal limits and the left great toe pressure and PPG waveforms are decreased. he recommended she continue to wear her compression stockings and continue with elevation. She is scheduled to have a noninvasive arterial study in the near future 08/16/2017 -- had a lower extremity arterial duplex examination done which showed patent distal right superficial femoral artery stent and above-the-knee popliteal stent without  evidence of restenosis. The ABI was more than 1.3 on the right and more than 1.3 on the left. This was consistent with noncompressible arteries due to medial calcification. The right great toe pressure and PPG waveforms are within normal limits and the left great toe pressure and PPG waveforms are decreased. the x-ray of the right ankle has not yet been done 08/24/2017 -- had a right ankle x-ray -- IMPRESSION:1. No fracture, bone lesion or evidence of osteomyelitis. 2. Lateral soft tissue swelling with a soft tissue ulcer. she has not yet seen the vascular surgeon for review 08/31/17 on evaluation today patient's wound appears to be showing signs of improvement. She still with her appointment with vascular in order to review her results of her vascular study and then determine if any intervention would be recommended at that time. No  fevers, chills, nausea, or vomiting noted at this time. She has been tolerating the dressing changes without complication. 09/28/17 on evaluation today patient's wound appears to show signs of good improvement in regard to the granulation tissue which is surfacing. There is still a layer of slough covering the wound and the posterior portion is still significantly deeper than the anterior nonetheless there has been some good sign of things moving towards the better. She is going to go back to Dr. dew for reevaluation to ensure her blood flow is still appropriate. That will be before her next evaluation with [place] next week. No fevers, chills, nausea, or vomiting noted at this time. Patient does have some discomfort rated to be a 3-4/10 depending on activity specifically cleansing the wound makes it worse. 10/05/2017 -- the patient was seen by Dr. Lucky Cowboy last week and noninvasive studies showed a normal right ABI with brisk KALEIA, LONGHI. ([id number]) triphasic waveforms consistent with no arterial insufficiency including normal digital pressures. The duplex showed a  patent distal right SFA stent and the proximal SFA was also normal. He was pleased with her test and thought she should have enough of perfusion for normal wound healing. He would see her back in 6 months time. 12/21/17 on evaluation today patient appears to be doing fairly well in regard to her right lateral ankle wound. Unfortunately the main issue that she is expansion at this point is that she is having some issues with what appears to be some cellulitis in the right anterior shin. She has also been noting a little bit of uncomfortable feeling especially last night and her ankle area. I'm afraid that she made the developing a little bit of an infection. With that being said I think it is in the early stages. 12/28/17 on evaluation today patient's ankle appears to be doing excellent. She's making good progress at this point the cellulitis seems to have improved after last week's evaluation. Overall she is having no significant discomfort which is excellent news. She does have an appointment with Dr. dew on March 29, 2018 for reevaluation in regard to the stent he placed. She seems to have excellent blood flow in the right lower extremity. 01/19/12 on evaluation today patient's wound appears to be doing very well. In fact she does not appear to require debridement at this point, there's no evidence of infection, and overall from the standpoint of the wound she seems to be doing very well. With that being said I believe that it may be time to switch to different dressing away from the [facility] Dressing she tells me she does have a lot going on her friend actually passed away yesterday and she's also having a lot of issues with her husband this obviously is weighing heavy on her as far as your thoughts and concerns today. Patient History Information obtained from Patient. Family History Cancer - Father,Siblings, Heart Disease - Siblings, No family history of Diabetes, Hereditary Spherocytosis,  Hypertension, Kidney Disease, Lung Disease, Seizures, Stroke, Thyroid Problems, Tuberculosis. Social History Never smoker, Marital Status - Married, Alcohol Use - Never, Drug Use - No History, Caffeine Use - Rarely. Review of Systems (ROS) Constitutional Symptoms (General Health) Denies complaints or symptoms of Fever, Chills. Respiratory The patient has no complaints or symptoms. Cardiovascular The patient has no complaints or symptoms. Psychiatric The patient has no complaints or symptoms. Objective Constitutional Well-nourished and well-hydrated in no acute distress. Vitals Time Taken: 12:42 PM, Height: 65 in, Weight: 154.3 lbs, BMI:  25.7, Temperature: 98.1 F, Pulse: 88 bpm, Respiratory Rate: 16 breaths/min, Blood Pressure: 135/73 mmHg. BERTA, DENSON ([id number]) Respiratory normal breathing without difficulty. clear to auscultation bilaterally. Cardiovascular regular rate and rhythm with normal S1, S2. Psychiatric this patient is able to make decisions and demonstrates good insight into disease process. Alert and Oriented x 3. pleasant and cooperative. General Notes: Patient s wound bed appears to show evidence of good granulation at this point there does not appear to be any evidence of significant hyper granulation which is good. I'm gonna recommend switching away from the [facility] Dressing in favor of Prisma at this point. Integumentary (Hair, Skin) Wound #1 status is Open. Original cause of wound was Gradually Appeared. The wound is located on the Right,Lateral Malleolus. The wound measures 0.7cm length x 0.7cm width x 0.1cm depth; 0.385cm^2 area and 0.038cm^3 volume. There is Fat Layer (Subcutaneous Tissue) Exposed exposed. There is no tunneling or undermining noted. There is a large amount of serous drainage noted. The wound margin is distinct with the outline attached to the wound base. There is large (67-100%) red granulation within the wound bed. There is a  small (1-33%) amount of necrotic tissue within the wound bed including Adherent Slough. The periwound skin appearance exhibited: Excoriation, Rubor, Erythema. The periwound skin appearance did not exhibit: Callus, Crepitus, Induration, Rash, Scarring, Dry/Scaly, Maceration, Atrophie Blanche, Cyanosis, Ecchymosis, Hemosiderin Staining, Mottled, Pallor. The surrounding wound skin color is noted with erythema which is circumferential. Periwound temperature was noted as No Abnormality. The periwound has tenderness on palpation. Assessment Active Problems ICD-10 L97.312 - 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d I70.233 - Atherosclerosis of native arteries of right leg with ulceration of ankle I89.0 - Lymphedema, not elsewhere classified B35.4 - Tinea corporis Plan Wound Cleansing: Wound #1 Right,Lateral Malleolus: Clean wound with Normal Saline. Cleanse wound with mild soap and water May Shower, gently pat wound dry prior to applying new dressing. Anesthetic (add to Medication List): Wound #1 Right,Lateral Malleolus: Topical Lidocaine 4% cream applied to wound bed prior to debridement (In Clinic Only). Primary Wound Dressing: Wound #1 Right,Lateral Malleolus: Prisma Ag Secondary Dressing: [name] ([id number]) Wound #1 Right,Lateral Malleolus: Dry Gauze Conform/Kerlix Other - stretch net Dressing Change Frequency: Wound #1 Right,Lateral Malleolus: Change dressing every other day. Follow-up Appointments: Wound #1 Right,Lateral Malleolus: Return Appointment in 1 week. Edema Control: Wound #1 Right,Lateral Malleolus: Patient to wear own Juxtalite/Juzo compression garment. Elevate legs to the level of the heart and pump ankles as often as possible Off-Loading: Wound #1 Right,Lateral Malleolus: Turn and reposition every 2 hours Other: - do not put pressure on your right ankle Additional Orders / Instructions: Wound #1 Right,Lateral  Malleolus: Increase protein intake. Other: - Add Vitamin C, Zinc, Vitamin A to your diet The following medication(s) was prescribed: lidocaine topical 4 % cream 1 1 cream topical was prescribed at facility I am going to recommend that we continue with the Current wound care measures for the next week patient is in agreement with plan. We will see were things stand with the new dressing at that point hopefully she will note significant improvement. I think the biggest issue at this point is ensuring that the skin around the wound did not become callous and start to grow over the wound surface. We will keep an eye on this although today everything I looked to be doing well. Please see above for specific wound care orders. We will see patient for re-evaluation in 1  week(s) here in the clinic. If anything worsens or changes patient will contact our office for additional recommendations. Electronic Signature(s) Signed: 01/21/2018 8:04:39 AM By: Worthy Keeler PA-C Entered By: Worthy Keeler on 01/18/2018 13:33:24 Idrovo, [name] ([id number]) -------------------------------------------------------------------------------- ROS/PFSH Details Patient Name: [name] Date of Service: 01/18/2018 12:30 PM Medical Record Number: [id number] Patient Account Number: [id number] Date of Birth/Sex: Jul 15, 1923 (84 y.o. Female) Treating RN: Ahmed Prima Primary Care Provider: FITZGERALD, DAVID Other Clinician: Referring Provider: FITZGERALD, DAVID Treating Provider/Extender: STONE III, Timouthy Gilardi Weeks in Treatment: 23 Information Obtained From Patient Wound History Do you currently have one or more open woundso Yes How many open wounds do you currently haveo 1 Approximately how long have you had your woundso 2 yrs How have you been treating your wound(s) until nowo mupirocin, soaking in epsom salt Has your wound(s) ever healed and then re-openedo No Have you had any lab work done in the past  montho No Have you tested positive for an antibiotic resistant organism (MRSA, VRE)o No Have you tested positive for osteomyelitis (bone infection)o No Have you had any tests for circulation on your legso Yes Who ordered the testo Dr. Lucky Cowboy [facility] Constitutional Symptoms (General Health) Complaints and Symptoms: Negative for: Fever; Chills Eyes Medical History: Positive for: Cataracts - surgery Respiratory Complaints and Symptoms: No Complaints or Symptoms Cardiovascular Complaints and Symptoms: No Complaints or Symptoms Medical History: Positive for: Congestive Heart Failure; Hypertension Musculoskeletal Medical History: Positive for: Osteoarthritis Neurologic Medical History: Positive for: Neuropathy Oncologic RAEYA, MERRITTS ([id number]) Medical History: Negative for: Received Chemotherapy; Received Radiation Psychiatric Complaints and Symptoms: No Complaints or Symptoms HBO Extended History Items Eyes: Cataracts Immunizations Pneumococcal Vaccine: Received Pneumococcal Vaccination: Yes Implantable Devices Family and Social History Cancer: Yes - Father,Siblings; Diabetes: No; Heart Disease: Yes - Siblings; Hereditary Spherocytosis: No; Hypertension: No; Kidney Disease: No; Lung Disease: No; Seizures: No; Stroke: No; Thyroid Problems: No; Tuberculosis: No; Never smoker; Marital Status - Married; Alcohol Use: Never; Drug Use: No History; Caffeine Use: Rarely; Financial Concerns: No; Food, Clothing or Shelter Needs: No; Support System Lacking: No; Transportation Concerns: No; Advanced Directives: No; Patient does not want information on Advanced Directives; Do not resuscitate: No; Living Will: Yes (Not Provided); Medical Power of Attorney: No Physician Affirmation I have reviewed and agree with the above information. Electronic Signature(s) Signed: 01/18/2018 4:55:35 PM By: Alric Quan Signed: 01/21/2018 8:04:39 AM By: Worthy Keeler  PA-C Entered By: Worthy Keeler on 01/18/2018 13:32:46 Detienne, [name] ([id number]) -------------------------------------------------------------------------------- SuperBill Details Patient Name: [name] Date of Service: 01/18/2018 Medical Record Number: [id number] Patient Account Number: [id number] Date of Birth/Sex: [birth year] (84 y.o. 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 I70.233 Atherosclerosis of native arteries of right leg with ulceration of ankle I89.0 Lymphedema, not elsewhere classified B35.4 Tinea corporis Facility Procedures CPT4 Code: 63149702 Description: 99213 - WOUND CARE VISIT-LEV 3 EST PT Modifier: Quantity: 1 Physician Procedures CPT4 Code: 6378588 Description: 50277 - WC PHYS LEVEL 3 - EST PT ICD-10 Diagnosis Description A12.878 Non-pressure chronic ulcer of right ankle with fat layer expos I70.233 Atherosclerosis of native arteries of right leg with ulceratio I89.0 Lymphedema, not elsewhere  classified B35.4 Tinea corporis Modifier: ed n of ankle Quantity: 1 Electronic Signature(s) Signed: 01/18/2018 2:09:47 PM  By: Alric Quan Signed: 01/21/2018 8:04:39 AM By: Worthy Keeler PA-C Entered By: Alric Quan on 01/18/2018 14:09:47

## 2018-01-22 NOTE — Progress Notes (Signed)
[name], [name] ([id number]) Visit Report for 01/18/2018 Arrival Information Details Patient Name: [name], [name] Date of Service: 01/18/2018 12:30 PM Medical Record Number: [id number] Patient Account Number: [id number] Date of Birth/Sex: [birth year] (84 y.o. Female) Treating RN: Ahmed Prima Primary Care Pryor Guettler: FITZGERALD, DAVID Other Clinician: Referring Cayleb Jarnigan: FITZGERALD, DAVID Treating Jolanta Cabeza/Extender: STONE III, HOYT Weeks in Treatment: 23 Visit Information History Since Last Visit All ordered tests and consults were completed: No Patient Arrived: Ambulatory Added or deleted any medications: No Arrival Time: 12:41 Any new allergies or adverse reactions: No Accompanied By: self Had a fall or experienced change in No Transfer Assistance: None activities of daily living that may affect Patient Identification Verified: Yes risk of falls: Secondary Verification Process Completed: Yes Signs or symptoms of abuse/neglect since last visito No Patient Requires Transmission-Based No Hospitalized since last visit: No Precautions: Has Dressing in Place as Prescribed: Yes Patient Has Alerts: No Pain Present Now: No Electronic Signature(s) Signed: 01/18/2018 4:55:35 PM By: Alric Quan Entered By: Alric Quan on 01/18/2018 12:41:30 [name] ([id number]) -------------------------------------------------------------------------------- Clinic Level of Care Assessment Details Patient Name: [name] Date of Service: 01/18/2018 12:30 PM Medical Record Number: [id number] Patient Account Number: [id number] Date of Birth/Sex: Jun 15, 1923 (84 y.o. Female) Treating RN: Ahmed Prima Primary Care Monasia Lair: FITZGERALD, DAVID Other Clinician: Referring Matisse Roskelley: FITZGERALD, DAVID Treating Yutaka Holberg/Extender: STONE III, HOYT Weeks in Treatment: 23 Clinic Level of Care Assessment Items TOOL 4 Quantity Score X - Use when only an EandM is performed on  FOLLOW-UP visit 1 0 ASSESSMENTS - Nursing Assessment / Reassessment X - Reassessment of Co-morbidities (includes updates in patient status) 1 10 X- 1 5 Reassessment of Adherence to Treatment Plan ASSESSMENTS - Wound and Skin Assessment / Reassessment X - Simple Wound Assessment / Reassessment - one wound 1 5 []  - 0 Complex Wound Assessment / Reassessment - multiple wounds []  - 0 Dermatologic / Skin Assessment (not related to wound area) ASSESSMENTS - Focused Assessment []  - Circumferential Edema Measurements - multi extremities 0 []  - 0 Nutritional Assessment / Counseling / Intervention []  - 0 Lower Extremity Assessment (monofilament, tuning fork, pulses) []  - 0 Peripheral Arterial Disease Assessment (using hand held doppler) ASSESSMENTS - Ostomy and/or Continence Assessment and Care []  - Incontinence Assessment and Management 0 []  - 0 Ostomy Care Assessment and Management (repouching, etc.) PROCESS - Coordination of Care X - Simple Patient / Family Education for ongoing care 1 15 []  - 0 Complex (extensive) Patient / Family Education for ongoing care []  - 0 Staff obtains Programmer, systems, Records, Test Results / Process Orders []  - 0 Staff telephones HHA, Nursing Homes / Clarify orders / etc []  - 0 Routine Transfer to another Facility (non-emergent condition) []  - 0 Routine Hospital Admission (non-emergent condition) []  - 0 New Admissions / Biomedical engineer / Ordering NPWT, Apligraf, etc. []  - 0 Emergency Hospital Admission (emergent condition) X- 1 10 Simple Discharge Coordination [name] ([id number]) []  - 0 Complex (extensive) Discharge Coordination PROCESS - Special Needs []  - Pediatric / Minor Patient Management 0 []  - 0 Isolation Patient Management []  - 0 Hearing / Language / Visual special needs []  - 0 Assessment of Community assistance (transportation, D/C planning, etc.) []  - 0 Additional assistance / Altered mentation []  - 0 Support  Surface(s) Assessment (bed, cushion, seat, etc.) INTERVENTIONS - Wound Cleansing / Measurement X - Simple Wound Cleansing - one wound 1 5 []  - 0 Complex Wound Cleansing - multiple wounds X- 1 5 Wound Imaging (photographs -  any number of wounds) []  - 0 Wound Tracing (instead of photographs) X- 1 5 Simple Wound Measurement - one wound []  - 0 Complex Wound Measurement - multiple wounds INTERVENTIONS - Wound Dressings X - Small Wound Dressing one or multiple wounds 1 10 []  - 0 Medium Wound Dressing one or multiple wounds []  - 0 Large Wound Dressing one or multiple wounds X- 1 5 Application of Medications - topical []  - 0 Application of Medications - injection INTERVENTIONS - Miscellaneous []  - External ear exam 0 []  - 0 Specimen Collection (cultures, biopsies, blood, body fluids, etc.) []  - 0 Specimen(s) / Culture(s) sent or taken to Lab for analysis []  - 0 Patient Transfer (multiple staff / Civil Service fast streamer / Similar devices) []  - 0 Simple Staple / Suture removal (25 or less) []  - 0 Complex Staple / Suture removal (26 or more) []  - 0 Hypo / Hyperglycemic Management (close monitor of Blood Glucose) []  - 0 Ankle / Brachial Index (ABI) - do not check if billed separately X- 1 5 Vital Signs [name] ([id number]) Has the patient been seen at the hospital within the last three years: Yes Total Score: 80 Level Of Care: New/Established - Level 3 Electronic Signature(s) Signed: 01/18/2018 4:55:35 PM By: Alric Quan Entered By: Alric Quan on 01/18/2018 14:09:39 [name] ([id number]) -------------------------------------------------------------------------------- Encounter Discharge Information Details Patient Name: [name] Date of Service: 01/18/2018 12:30 PM Medical Record Number: [id number] Patient Account Number: [id number] Date of Birth/Sex: [birth year] (84 y.o. Female) Treating RN: Ahmed Prima Primary Care Nyah Shepherd: FITZGERALD, DAVID  Other Clinician: Referring Evoleht Hovatter: FITZGERALD, DAVID Treating Loron Weimer/Extender: STONE III, HOYT Weeks in Treatment: 23 Encounter Discharge Information Items Discharge Pain Level: 0 Discharge Condition: Stable Ambulatory Status: Ambulatory Discharge Destination: Home Transportation: Private Auto Accompanied By: self Schedule Follow-up Appointment: Yes Medication Reconciliation completed and No provided to Patient/Care Ashanty Coltrane: Provided on Clinical Summary of Care: 01/18/2018 Form Type Recipient Paper Patient WS Electronic Signature(s) Signed: 01/21/2018 4:58:43 PM By: Ruthine Dose Entered By: Ruthine Dose on 01/18/2018 13:08:26 Stordahl, [name] ([id number]) -------------------------------------------------------------------------------- Lower Extremity Assessment Details Patient Name: [name] Date of Service: 01/18/2018 12:30 PM Medical Record Number: [id number] Patient Account Number: [id number] Date of Birth/Sex: 1923-07-21 (84 y.o. Female) Treating RN: Ahmed Prima Primary Care Jaylynn Siefert: FITZGERALD, DAVID Other Clinician: Referring Marleny Faller: FITZGERALD, DAVID Treating Caroll Weinheimer/Extender: STONE III, HOYT Weeks in Treatment: 23 Vascular Assessment Pulses: Dorsalis Pedis Palpable: [Right:Yes] Posterior Tibial Extremity colors, hair growth, and conditions: Extremity Color: [Right:Hyperpigmented] Temperature of Extremity: [Right:Warm] Capillary Refill: [Right:< 3 seconds] Toe Nail Assessment Left: Right: Thick: Yes Discolored: Yes Deformed: Yes Improper Length and Hygiene: No Electronic Signature(s) Signed: 01/18/2018 4:55:35 PM By: Alric Quan Entered By: Alric Quan on 01/18/2018 12:46:57 Rainford, [name] ([id number]) -------------------------------------------------------------------------------- Multi Wound Chart Details Patient Name: [name] Date of Service: 01/18/2018 12:30 PM Medical Record Number: [id number] Patient  Account Number: [id number] Date of Birth/Sex: 1923-06-28 (84 y.o. Female) Treating RN: Ahmed Prima Primary Care Mirjana Tarleton: FITZGERALD, DAVID Other Clinician: Referring Davian Wollenberg: FITZGERALD, DAVID Treating Kerrington Greenhalgh/Extender: STONE III, HOYT Weeks in Treatment: 23 Vital Signs Height(in): 65 Pulse(bpm): 88 Weight(lbs): 154.3 Blood Pressure(mmHg): 135/73 Body Mass Index(BMI): 26 Temperature(F): 98.1 Respiratory Rate 16 (breaths/min): Photos: [1:No Photos] [N/A:N/A] Wound Location: [1:Right Malleolus - Lateral] [N/A:N/A] Wounding Event: [1:Gradually Appeared] [N/A:N/A] Primary Etiology: [1:Lymphedema] [N/A:N/A] Secondary Etiology: [1:Arterial Insufficiency Ulcer] [N/A:N/A] Comorbid History: [1:Cataracts, Congestive Heart Failure, Hypertension, Osteoarthritis, Neuropathy] [N/A:N/A] Date Acquired: [1:08/11/2015] [N/A:N/A] Weeks of Treatment: [1:23] [N/A:N/A] Wound Status: [1:Open] [N/A:N/A] Measurements L  x W x D [1:0.7x0.7x0.1] [N/A:N/A] (cm) Area (cm) : [1:0.385] [N/A:N/A] Volume (cm) : [1:0.038] [N/A:N/A] % Reduction in Area: [1:74.90%] [N/A:N/A] % Reduction in Volume: [1:91.70%] [N/A:N/A] Classification: [1:Full Thickness Without Exposed Support Structures] [N/A:N/A] Exudate Amount: [1:Large] [N/A:N/A] Exudate Type: [1:Serous] [N/A:N/A] Exudate Color: [1:amber] [N/A:N/A] Wound Margin: [1:Distinct, outline attached] [N/A:N/A] Granulation Amount: [1:Large (67-100%)] [N/A:N/A] Granulation Quality: [1:Red] [N/A:N/A] Necrotic Amount: [1:Small (1-33%)] [N/A:N/A] Exposed Structures: [1:Fat Layer (Subcutaneous Tissue) Exposed: Yes] [N/A:N/A] Epithelialization: [1:None] [N/A:N/A] Periwound Skin Texture: [1:Excoriation: Yes Induration: No Callus: No Crepitus: No Rash: No Scarring: No] [N/A:N/A] Periwound Skin Moisture: [1:Maceration: No Dry/Scaly: No] [N/A:N/A] Periwound Skin Color: Erythema: Yes N/A N/A Rubor: Yes Atrophie Blanche: No Cyanosis: No Ecchymosis:  No Hemosiderin Staining: No Mottled: No Pallor: No Erythema Location: Circumferential N/A N/A Temperature: No Abnormality N/A N/A Tenderness on Palpation: Yes N/A N/A Wound Preparation: Ulcer Cleansing: N/A N/A Rinsed/Irrigated with Saline Topical Anesthetic Applied: Other: lidocaine 4% Treatment Notes Electronic Signature(s) Signed: 01/18/2018 4:55:35 PM By: Alric Quan Entered By: Alric Quan on 01/18/2018 12:54:06 Jenny, [name] ([id number]) -------------------------------------------------------------------------------- [place] Details Patient Name: [name] Date of Service: 01/18/2018 12:30 PM Medical Record Number: [id number] Patient Account Number: [id number] Date of Birth/Sex: 29-Apr-1923 (84 y.o. Female) Treating RN: Ahmed Prima Primary Care Tameron Lama: FITZGERALD, DAVID Other Clinician: Referring Flower Franko: FITZGERALD, DAVID Treating Sherina Stammer/Extender: STONE III, HOYT Weeks in Treatment: 23 Active Inactive ` Abuse / Safety / Falls / Self Care Management Nursing Diagnoses: Potential for falls Goals: Patient will not experience any injury related to falls Date Initiated: 08/10/2017 Target Resolution Date: 11/10/2017 Goal Status: Active Interventions: Assess Activities of Daily Living upon admission and as needed Assess fall risk on admission and as needed Assess: immobility, friction, shearing, incontinence upon admission and as needed Notes: ` Nutrition Nursing Diagnoses: Imbalanced nutrition Potential for alteratiion in Nutrition/Potential for imbalanced nutrition Goals: Patient/caregiver agrees to and verbalizes understanding of need to use nutritional supplements and/or vitamins as prescribed Date Initiated: 08/10/2017 Target Resolution Date: 12/08/2017 Goal Status: Active Interventions: Assess patient nutrition upon admission and as needed per policy Notes: ` Orientation to the Wound Care Program Nursing  Diagnoses: Knowledge deficit related to the wound healing center program Goals: Patient/caregiver will verbalize understanding of the [facility] Date Initiated: 08/10/2017 Target Resolution Date: 09/08/2017 RAILEIGH, SABATER ([id number]) Goal Status: Active Interventions: Provide education on orientation to the wound center Notes: ` Pain, Acute or Chronic Nursing Diagnoses: Pain, acute or chronic: actual or potential Potential alteration in comfort, pain Goals: Patient/caregiver will verbalize adequate pain control between visits Date Initiated: 08/10/2017 Target Resolution Date: 12/08/2017 Goal Status: Active Interventions: Complete pain assessment as per visit requirements Notes: ` Wound/Skin Impairment Nursing Diagnoses: Impaired tissue integrity Knowledge deficit related to ulceration/compromised skin integrity Goals: Ulcer/skin breakdown will have a volume reduction of 80% by week 12 Date Initiated: 08/10/2017 Target Resolution Date: 12/01/2017 Goal Status: Active Interventions: Assess patient/caregiver ability to perform ulcer/skin care regimen upon admission and as needed Notes: Electronic Signature(s) Signed: 01/18/2018 4:55:35 PM By: Alric Quan Entered By: Alric Quan on 01/18/2018 12:51:25 [place]loski, [name] ([id number]) -------------------------------------------------------------------------------- Pain Assessment Details Patient Name: [name] Date of Service: 01/18/2018 12:30 PM Medical Record Number: [id number] Patient Account Number: [id number] Date of Birth/Sex: Feb 04, 1923 (84 y.o. Female) Treating RN: Ahmed Prima Primary Care Quamir Willemsen: FITZGERALD, DAVID Other Clinician: Referring Kennedy Bohanon: FITZGERALD, DAVID Treating Mariacristina Aday/Extender: STONE III, HOYT Weeks in Treatment: 23 Active Problems Location of Pain Severity and Description of Pain Patient Has Paino No Site  Locations Pain Management and Medication Current  Pain Management: Electronic Signature(s) Signed: 01/18/2018 4:55:35 PM By: Alric Quan Entered By: Alric Quan on 01/18/2018 12:42:34 Ellender, [name] ([id number]) -------------------------------------------------------------------------------- Patient/Caregiver Education Details Patient Name: [name] Date of Service: 01/18/2018 12:30 PM Medical Record Number: [id number] Patient Account Number: [id number] Date of Birth/Gender: August 27, 1923 (84 y.o. Female) Treating RN: Ahmed Prima Primary Care Physician: FITZGERALD, DAVID Other Clinician: Referring Physician: FITZGERALD, DAVID Treating Physician/Extender: Melburn Hake, HOYT Weeks in Treatment: 30 Education Assessment Education Provided To: Patient Education Topics Provided Wound/Skin Impairment: Handouts: Caring for Your Ulcer, Other: change dressing as ordered Methods: Demonstration, Explain/Verbal Responses: State content correctly Electronic Signature(s) Signed: 01/18/2018 4:55:35 PM By: Alric Quan Entered By: Alric Quan on 01/18/2018 12:54:46 [place], [name] ([id number]) -------------------------------------------------------------------------------- Wound Assessment Details Patient Name: [name] Date of Service: 01/18/2018 12:30 PM Medical Record Number: [id number] Patient Account Number: [id number] Date of Birth/Sex: 05/03/1923 (84 y.o. Female) Treating RN: Ahmed Prima Primary Care Masey Scheiber: FITZGERALD, DAVID Other Clinician: Referring Havannah Streat: FITZGERALD, DAVID Treating Beau Ramsburg/Extender: STONE III, HOYT Weeks in Treatment: 23 Wound Status Wound Number: 1 Primary Lymphedema Etiology: Wound Location: Right Malleolus - Lateral Secondary Arterial Insufficiency Ulcer Wounding Event: Gradually Appeared Etiology: Date Acquired: 08/11/2015 Wound Status: Open Weeks Of Treatment: 23 Comorbid Cataracts, Congestive Heart Failure, Clustered Wound: No History: Hypertension,  Osteoarthritis, Neuropathy Photos Photo Uploaded By: Alric Quan on 01/18/2018 16:21:52 Wound Measurements Length: (cm) 0.7 Width: (cm) 0.7 Depth: (cm) 0.1 Area: (cm) 0.385 Volume: (cm) 0.038 % Reduction in Area: 74.9% % Reduction in Volume: 91.7% Epithelialization: None Tunneling: No Undermining: No Wound Description Full Thickness Without Exposed Support Classification: Structures Wound Margin: Distinct, outline attached Exudate Large Amount: Exudate Type: Serous Exudate Color: amber Foul Odor After Cleansing: No Slough/Fibrino Yes Wound Bed Granulation Amount: Large (67-100%) Exposed Structure Granulation Quality: Red Fat Layer (Subcutaneous Tissue) Exposed: Yes Necrotic Amount: Small (1-33%) Necrotic Quality: Adherent Slough Periwound Skin Texture Texture Color Ching, [name] ([id number]) No Abnormalities Noted: No No Abnormalities Noted: No Callus: No Atrophie Blanche: No Crepitus: No Cyanosis: No Excoriation: Yes Ecchymosis: No Induration: No Erythema: Yes Rash: No Erythema Location: Circumferential Scarring: No Hemosiderin Staining: No Mottled: No Moisture Pallor: No No Abnormalities Noted: No Rubor: Yes Dry / Scaly: No Maceration: No Temperature / Pain Temperature: No Abnormality Tenderness on Palpation: Yes Wound Preparation Ulcer Cleansing: Rinsed/Irrigated with Saline Topical Anesthetic Applied: Other: lidocaine 4%, Treatment Notes Wound #1 (Right, Lateral Malleolus) 1. Cleansed with: Clean wound with Normal Saline 2. Anesthetic Topical Lidocaine 4% cream to wound bed prior to debridement 4. Dressing Applied: Prisma Ag 5. Secondary Dressing Applied Dry Gauze Kerlix/Conform 7. Secured with Tape Notes stretchnet, Juxtalite Electronic Signature(s) Signed: 01/18/2018 4:55:35 PM By: Alric Quan Entered By: Alric Quan on 01/18/2018 12:50:52 Vavra, [name]  ([id number]) -------------------------------------------------------------------------------- [place] Details Patient Name: [name] Date of Service: 01/18/2018 12:30 PM Medical Record Number: [id number] Patient Account Number: [id number] Date of Birth/Sex: 07-20-1923 (84 y.o. Female) Treating RN: Ahmed Prima Primary Care Mattelyn Imhoff: FITZGERALD, DAVID Other Clinician: Referring Gumaro Brightbill: FITZGERALD, DAVID Treating Zavia Pullen/Extender: STONE III, HOYT Weeks in Treatment: 23 Vital Signs Time Taken: 12:42 Temperature (F): 98.1 Height (in): 65 Pulse (bpm): 88 Weight (lbs): 154.3 Respiratory Rate (breaths/min): 16 Body Mass Index (BMI): 25.7 Blood Pressure (mmHg): 135/73 Reference Range: 80 - 120 mg / dl Electronic Signature(s) Signed: 01/18/2018 4:55:35 PM By: Alric Quan Entered By: Alric Quan on 01/18/2018 12:42:54

## 2018-01-25 ENCOUNTER — Encounter: Payer: Medicare Other | Admitting: Physician Assistant

## 2018-01-25 DIAGNOSIS — I70233 Atherosclerosis of native arteries of right leg with ulceration of ankle: Secondary | ICD-10-CM | POA: Diagnosis not present

## 2018-01-27 NOTE — Progress Notes (Signed)
[name], [name] ([id number]) Visit Report for 01/25/2018 Chief Complaint Document Details Patient Name: [name], [name]. Date of Service: 01/25/2018 12:30 PM Medical Record Number: [id number] Patient Account Number: [id number] Date of Birth/Sex: [birth year] (84 y.o. Female) Treating RN: Montey Hora Primary Care Provider: FITZGERALD, DAVID Other Clinician: Referring Provider: FITZGERALD, DAVID Treating Provider/Extender: STONE III, Albert Devaul Weeks in Treatment: 24 Information Obtained from: Patient Chief Complaint Patient presents to the wound care center for a consult due non healing wound to the right lateral malleolus Electronic Signature(s) Signed: 01/26/2018 12:37:14 PM By: Worthy Keeler PA-C Entered By: Worthy Keeler on 01/25/2018 12:54:05 Belgarde, [name] ([id number]) -------------------------------------------------------------------------------- Debridement Details Patient Name: [name] Date of Service: 01/25/2018 12:30 PM Medical Record Number: [id number] Patient Account Number: [id number] Date of Birth/Sex: 27-Jan-1923 (84 y.o. Female) Treating RN: Montey Hora Primary Care Provider: FITZGERALD, DAVID Other Clinician: Referring Provider: FITZGERALD, DAVID Treating Provider/Extender: STONE III, Victoriana Aziz Weeks in Treatment: 24 Debridement Performed for Wound #1 Right,Lateral Malleolus Assessment: Performed By: Physician STONE III, Lana Flaim E., PA-C Debridement: Debridement Severity of Tissue Pre Fat layer exposed Debridement: Pre-procedure Verification/Time Yes - 13:00 Out Taken: Start Time: 13:00 Pain Control: Lidocaine 4% Topical Solution Level: Skin/Subcutaneous Tissue Total Area Debrided (L x W): 0.6 (cm) x 0.7 (cm) = 0.42 (cm) Tissue and other material Viable, Non-Viable, Callus, Fibrin/Slough, Subcutaneous debrided: Instrument: Curette Bleeding: Minimum Hemostasis Achieved: Pressure End Time: 13:04 Procedural Pain: 0 Post Procedural Pain:  0 Response to Treatment: Procedure was tolerated well Post Debridement Measurements of Total Wound Length: (cm) 0.6 Width: (cm) 0.7 Depth: (cm) 0.2 Volume: (cm) 0.066 Character of Wound/Ulcer Post Debridement: Improved Severity of Tissue Post Debridement: Fat layer exposed Post Procedure Diagnosis Same as Pre-procedure Electronic Signature(s) Signed: 01/25/2018 5:21:02 PM By: Montey Hora Signed: 01/26/2018 12:37:14 PM By: Worthy Keeler PA-C Entered By: Montey Hora on 01/25/2018 13:02:59 Avans, [name] ([id number]) -------------------------------------------------------------------------------- HPI Details Patient Name: [name] Date of Service: 01/25/2018 12:30 PM Medical Record Number: [id number] Patient Account Number: [id number] Date of Birth/Sex: 05-01-1923 (84 y.o. 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 Dr. Amalia Hailey the podiatrist saw her on 07/20/2017 and sent her to the wound center for possible hyperbaric oxygen therapy. past medical history of peripheral vascular disease, varicose veins, status post appendectomy, basal cell carcinoma excision from the left leg, cholecystectomy, pacemaker placement, right lower extremity angiography done by Dr. dew in March 2017 with placement of a stent. there is also note of a successful ablation of the right small saphenous vein done which was reviewed by ultrasound on 10/24/2016. the patient had a right small saphenous vein ablation done on 10/20/2016. The patient has never been a smoker. She has been seen by Dr. Corene Cornea dew  the vascular surgeon who most recently saw her on 06/15/2017 for evaluation of ongoing problems with right leg swelling. She had a lower extremity arterial duplex examination done(02/13/17) which showed patent distal right superficial femoral artery stent and above-the-knee popliteal stent without evidence of restenosis. The ABI was more than 1.3 on the right and more than 1.3 on the left. This was consistent with noncompressible arteries due to medial calcification. The right great toe pressure and PPG  waveforms are within normal limits and the left great toe pressure and PPG waveforms are decreased. he recommended she continue to wear her compression stockings and continue with elevation. She is scheduled to have a noninvasive arterial study in the near future 08/16/2017 -- had a lower extremity arterial duplex examination done which showed patent distal right superficial femoral artery stent and above-the-knee popliteal stent without evidence of restenosis. The ABI was more than 1.3 on the right and more than 1.3 on the left. This was consistent with noncompressible arteries due to medial calcification. The right great toe pressure and PPG waveforms are within normal limits and the left great toe pressure and PPG waveforms are decreased. the x-ray of the right ankle has not yet been done 08/24/2017 -- had a right ankle x-ray -- IMPRESSION:1. No fracture, bone lesion or evidence of osteomyelitis. 2. Lateral soft tissue swelling with a soft tissue ulcer. she has not yet seen the vascular surgeon for review 08/31/17 on evaluation today patient's wound appears to be showing signs of improvement. She still with her appointment with vascular in order to review her results of her vascular study and then determine if any intervention would be recommended at that time. No fevers, chills, nausea, or vomiting noted at this time. She has been tolerating the dressing changes without complication. 09/28/17 on  evaluation today patient's wound appears to show signs of good improvement in regard to the granulation tissue which is surfacing. There is still a layer of slough covering the wound and the posterior portion is still significantly deeper than the anterior nonetheless there has been some good sign of things moving towards the better. She is going to go back to Dr. dew for reevaluation to ensure her blood flow is still appropriate. That will be before her next evaluation with [place] next week. No fevers, chills, nausea, or vomiting noted at this time. Patient does have some discomfort rated to be a 3-4/10 depending on activity specifically cleansing the wound makes it worse. 10/05/2017 -- the patient was seen by Dr. Lucky Cowboy last week and noninvasive studies showed a normal right ABI with brisk triphasic waveforms consistent with no arterial insufficiency including normal digital pressures. The duplex showed a patent distal right SFA stent and the proximal SFA was also normal. He was pleased with her test and thought she should have enough of perfusion for normal wound healing. He would see her back in 6 months time. 12/21/17 on evaluation today patient appears to be doing fairly well in regard to her right lateral ankle wound. Unfortunately the main issue that she is expansion at this point is that she is having some issues with what appears to be some cellulitis in the KAMEKO, HUKILL. ([id number]) right anterior shin. She has also been noting a little bit of uncomfortable feeling especially last night and her ankle area. I'm afraid that she made the developing a little bit of an infection. With that being said I think it is in the early stages. 12/28/17 on evaluation today patient's ankle appears to be doing excellent. She's making good progress at this point the cellulitis seems to have improved after last week's evaluation. Overall she is having no significant discomfort which is excellent news. She does  have an appointment with Dr. dew on March 29, 2018 for reevaluation in regard to the stent he placed. She seems to have excellent blood flow in the right lower extremity. 01/19/12 on evaluation today patient's wound appears to be doing very well.  In fact she does not appear to require debridement at this point, there's no evidence of infection, and overall from the standpoint of the wound she seems to be doing very well. With that being said I believe that it may be time to switch to different dressing away from the [facility] Dressing she tells me she does have a lot going on her friend actually passed away yesterday and she's also having a lot of issues with her husband this obviously is weighing heavy on her as far as your thoughts and concerns today. 01/25/18 on evaluation today patient appears to be doing fairly well in regard to her right lateral malleolus. She has been tolerating the dressing changes without complication. Overall I feel like this is definitely showing signs of improvement as far as how the overall appearance of the wound is there's also evidence of epithelium start to migrate over the granulation tissue. In general I think that she is progressing nicely as far as the wound is concerned. The only concern she really has is whether or not we can switch to every other week visits in order to avoid having as many appointments as her daughters have a difficult time getting her to her appointments as well as the patient's husband to his he is not doing very well at this point. Electronic Signature(s) Signed: 01/26/2018 12:37:14 PM By: Worthy Keeler PA-C Entered By: Worthy Keeler on 01/25/2018 13:09:04 Lust, [name] ([id number]) -------------------------------------------------------------------------------- Physical Exam Details Patient Name: [name] Date of Service: 01/25/2018 12:30 PM Medical Record Number: [id number] Patient Account Number:  [id number] Date of Birth/Sex: 1923/01/04 (84 y.o. Female) Treating RN: Montey Hora Primary Care Provider: FITZGERALD, DAVID Other Clinician: Referring Provider: FITZGERALD, DAVID Treating Provider/Extender: STONE III, Delpha Perko Weeks in Treatment: 85 Constitutional Well-nourished and well-hydrated in no acute distress. Respiratory normal breathing without difficulty. Psychiatric this patient is able to make decisions and demonstrates good insight into disease process. Alert and Oriented x 3. pleasant and cooperative. Notes At this point I'm going to go ahead and see about initiating treatment with every other week visit due to the fact that the wound does appear to be doing well and only appears that I'm having to debride about every other week anyway. Obviously this is good news and I think does show signs of improvement in general as far as patient's wound is concerned. There's no evidence of infection. Electronic Signature(s) Signed: 01/26/2018 12:37:14 PM By: Worthy Keeler PA-C Entered By: Worthy Keeler on 01/25/2018 13:10:03 Rainwater, [name] ([id number]) -------------------------------------------------------------------------------- Physician Orders Details Patient Name: [name] Date of Service: 01/25/2018 12:30 PM Medical Record Number: [id number] Patient Account Number: [id number] Date of Birth/Sex: Mar 18, 1923 (84 y.o. 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 o Cleanse wound with mild soap and water o May Shower, gently pat wound dry prior to applying new dressing. Anesthetic (add to Medication List) Wound #1 Right,Lateral Malleolus o Topical Lidocaine 4% cream applied to wound bed prior to debridement (In Clinic Only). Primary Wound Dressing Wound #1 Right,Lateral Malleolus o Prisma Ag  Secondary Dressing Wound #1 Right,Lateral Malleolus o Dry Gauze o Conform/Kerlix o Other - stretch net Dressing Change Frequency Wound #1 Right,Lateral Malleolus o Change dressing every other day. Follow-up Appointments Wound #1 Right,Lateral Malleolus o Return Appointment in 2 weeks. Edema Control Wound #1 Right,Lateral Malleolus o Patient to wear own Juxtalite/Juzo compression garment. o Elevate legs to the level of the heart and pump ankles as often as possible Jonsson, [name] ([id number]) Off-Loading Wound #1 Right,Lateral Malleolus o Turn and reposition every 2 hours o Other: - do not put pressure on your right ankle Additional Orders / Instructions Wound #1 Right,Lateral Malleolus o Increase protein intake. o Other: - Add Vitamin C, Zinc, Vitamin A to your diet Electronic Signature(s) Signed: 01/25/2018 5:21:02 PM By: Montey Hora Signed: 01/26/2018 12:37:14 PM By: Worthy Keeler PA-C Entered By: Montey Hora on 01/25/2018 13:05:20 Harrel, [name] ([id number]) -------------------------------------------------------------------------------- Problem List Details Patient Name: [name] Date of Service: 01/25/2018 12:30 PM Medical Record Number: [id number] Patient Account Number: [id number] Date of Birth/Sex: 29-Nov-1923 (84 y.o. Female) Treating RN: Montey Hora Primary Care Provider: FITZGERALD, DAVID Other Clinician: Referring Provider: FITZGERALD, DAVID Treating Provider/Extender: STONE III, Wong Steadham Weeks in Treatment: 24 Active  Problems ICD-10 Encounter Code Description Active Date Diagnosis L97.312 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d 08/10/2017 Yes I70.233 Atherosclerosis of native arteries of right leg with ulceration of ankle 08/10/2017 Yes I89.0 Lymphedema, not elsewhere classified 08/10/2017 Yes B35.4 Tinea corporis 09/28/2017 Yes Inactive Problems Resolved Problems Electronic Signature(s) Signed: 01/26/2018 12:37:14 PM By: Worthy Keeler PA-C Entered By: Worthy Keeler on 01/25/2018 12:53:39 Minner, [name] ([id number]) -------------------------------------------------------------------------------- Progress Note Details Patient Name: [name] Date of Service: 01/25/2018 12:30 PM Medical Record Number: [id number] Patient Account Number: [id number] Date of Birth/Sex: [birth year] (84 y.o. Female) Treating RN: Montey Hora Primary Care Provider: FITZGERALD, DAVID Other Clinician: Referring Provider: FITZGERALD, DAVID Treating Provider/Extender: STONE III, Louna Rothgeb Weeks in Treatment: 24 Subjective Chief Complaint Information obtained from Patient Patient presents to the wound care center for a consult due non healing wound to the right lateral malleolus History of Present Illness (HPI) 84 year old patient who most recently has been seeing both podiatry and vascular surgery for a long-standing ulcer of her right lateral malleolus which has been treated with various methodologies. Dr. Amalia Hailey the podiatrist saw her on 07/20/2017 and sent her to the wound center for possible hyperbaric oxygen therapy. past medical history of peripheral vascular disease, varicose veins, status post appendectomy, basal cell carcinoma excision from the left leg, cholecystectomy, pacemaker placement, right lower extremity angiography done by Dr. dew in March 2017 with placement of a stent. there is also note of a successful ablation of the right small saphenous vein done which was reviewed by  ultrasound on 10/24/2016. the patient had a right small saphenous vein ablation done on 10/20/2016. The patient has never been a smoker. She has been seen by Dr. Corene Cornea dew the vascular surgeon who most recently saw her on 06/15/2017 for evaluation of ongoing problems with right leg swelling. She had a lower extremity arterial duplex examination done(02/13/17) which showed patent distal right superficial femoral artery stent and above-the-knee popliteal stent without evidence of restenosis. The ABI was more than 1.3 on the right and more than 1.3 on the left. This was consistent with noncompressible arteries due to medial calcification. The right great toe pressure and PPG waveforms are within normal limits and the left great toe pressure and PPG waveforms are decreased. he recommended  she continue to wear her compression stockings and continue with elevation. She is scheduled to have a noninvasive arterial study in the near future 08/16/2017 -- had a lower extremity arterial duplex examination done which showed patent distal right superficial femoral artery stent and above-the-knee popliteal stent without evidence of restenosis. The ABI was more than 1.3 on the right and more than 1.3 on the left. This was consistent with noncompressible arteries due to medial calcification. The right great toe pressure and PPG waveforms are within normal limits and the left great toe pressure and PPG waveforms are decreased. the x-ray of the right ankle has not yet been done 08/24/2017 -- had a right ankle x-ray -- IMPRESSION:1. No fracture, bone lesion or evidence of osteomyelitis. 2. Lateral soft tissue swelling with a soft tissue ulcer. she has not yet seen the vascular surgeon for review 08/31/17 on evaluation today patient's wound appears to be showing signs of improvement. She still with her appointment with vascular in order to review her results of her vascular study and then determine if any intervention  would be recommended at that time. No fevers, chills, nausea, or vomiting noted at this time. She has been tolerating the dressing changes without complication. 09/28/17 on evaluation today patient's wound appears to show signs of good improvement in regard to the granulation tissue which is surfacing. There is still a layer of slough covering the wound and the posterior portion is still significantly deeper than the anterior nonetheless there has been some good sign of things moving towards the better. She is going to go back to Dr. dew for reevaluation to ensure her blood flow is still appropriate. That will be before her next evaluation with [place] next week. No fevers, chills, nausea, or vomiting noted at this time. Patient does have some discomfort rated to be a 3-4/10 depending on activity specifically cleansing the wound makes it worse. 10/05/2017 -- the patient was seen by Dr. Lucky Cowboy last week and noninvasive studies showed a normal right ABI with brisk CEDRICKA, SACKRIDER. ([id number]) triphasic waveforms consistent with no arterial insufficiency including normal digital pressures. The duplex showed a patent distal right SFA stent and the proximal SFA was also normal. He was pleased with her test and thought she should have enough of perfusion for normal wound healing. He would see her back in 6 months time. 12/21/17 on evaluation today patient appears to be doing fairly well in regard to her right lateral ankle wound. Unfortunately the main issue that she is expansion at this point is that she is having some issues with what appears to be some cellulitis in the right anterior shin. She has also been noting a little bit of uncomfortable feeling especially last night and her ankle area. I'm afraid that she made the developing a little bit of an infection. With that being said I think it is in the early stages. 12/28/17 on evaluation today patient's ankle appears to be doing excellent. She's making  good progress at this point the cellulitis seems to have improved after last week's evaluation. Overall she is having no significant discomfort which is excellent news. She does have an appointment with Dr. dew on March 29, 2018 for reevaluation in regard to the stent he placed. She seems to have excellent blood flow in the right lower extremity. 01/19/12 on evaluation today patient's wound appears to be doing very well. In fact she does not appear to require debridement at this point, there's no evidence of infection, and  overall from the standpoint of the wound she seems to be doing very well. With that being said I believe that it may be time to switch to different dressing away from the [facility] Dressing she tells me she does have a lot going on her friend actually passed away yesterday and she's also having a lot of issues with her husband this obviously is weighing heavy on her as far as your thoughts and concerns today. 01/25/18 on evaluation today patient appears to be doing fairly well in regard to her right lateral malleolus. She has been tolerating the dressing changes without complication. Overall I feel like this is definitely showing signs of improvement as far as how the overall appearance of the wound is there's also evidence of epithelium start to migrate over the granulation tissue. In general I think that she is progressing nicely as far as the wound is concerned. The only concern she really has is whether or not we can switch to every other week visits in order to avoid having as many appointments as her daughters have a difficult time getting her to her appointments as well as the patient's husband to his he is not doing very well at this point. Patient History Information obtained from Patient. Family History Cancer - Father,Siblings, Heart Disease - Siblings, No family history of Diabetes, Hereditary Spherocytosis, Hypertension, Kidney Disease, Lung Disease, Seizures,  Stroke, Thyroid Problems, Tuberculosis. Social History Never smoker, Marital Status - Married, Alcohol Use - Never, Drug Use - No History, Caffeine Use - Rarely. Review of Systems (ROS) Constitutional Symptoms (General Health) Denies complaints or symptoms of Fever, Chills. Respiratory The patient has no complaints or symptoms. Cardiovascular Complains or has symptoms of LE edema. Psychiatric The patient has no complaints or symptoms. Objective Briyanna, [place] [name] ([id number]) Constitutional Well-nourished and well-hydrated in no acute distress. Vitals Time Taken: 12:39 PM, Height: 65 in, Weight: 154.3 lbs, BMI: 25.7, Temperature: 98.1 F, Pulse: 92 bpm, Respiratory Rate: 16 breaths/min, Blood Pressure: 131/69 mmHg. Respiratory normal breathing without difficulty. Psychiatric this patient is able to make decisions and demonstrates good insight into disease process. Alert and Oriented x 3. pleasant and cooperative. General Notes: At this point I'm going to go ahead and see about initiating treatment with every other week visit due to the fact that the wound does appear to be doing well and only appears that I'm having to debride about every other week anyway. Obviously this is good news and I think does show signs of improvement in general as far as patient's wound is concerned. There's no evidence of infection. Integumentary (Hair, Skin) Wound #1 status is Open. Original cause of wound was Gradually Appeared. The wound is located on the Right,Lateral Malleolus. The wound measures 0.6cm length x 0.7cm width x 0.1cm depth; 0.33cm^2 area and 0.033cm^3 volume. There is Fat Layer (Subcutaneous Tissue) Exposed exposed. There is a large amount of serous drainage noted. The wound margin is distinct with the outline attached to the wound base. There is large (67-100%) red granulation within the wound bed. There is a small (1-33%) amount of necrotic tissue within the wound bed including  Adherent Slough. The periwound skin appearance exhibited: Excoriation, Rubor, Erythema. The periwound skin appearance did not exhibit: Callus, Crepitus, Induration, Rash, Scarring, Dry/Scaly, Maceration, Atrophie Blanche, Cyanosis, Ecchymosis, Hemosiderin Staining, Mottled, Pallor. The surrounding wound skin color is noted with erythema which is circumferential. Periwound temperature was noted as No Abnormality. The periwound has tenderness on palpation. Assessment Active Problems ICD-10 L97.312 -  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d I70.233 - Atherosclerosis of native arteries of right leg with ulceration of ankle I89.0 - Lymphedema, not elsewhere classified B35.4 - Tinea corporis Procedures Wound #1 Pre-procedure diagnosis of Wound #1 is a Lymphedema located on the Right,Lateral Malleolus .Severity of Tissue Pre Debridement is: Fat layer exposed. There was a Skin/Subcutaneous Tissue Debridement ([id number]) debridement with total area of 0.42 sq cm performed by STONE III, Barney Gertsch E., PA-C. with the following instrument(s): Curette to remove Viable and Non-Viable tissue/material including Fibrin/Slough, Callus, and Subcutaneous after achieving pain control using Lidocaine 4% Topical Solution. A time out was conducted at 13:00, prior to the start of the procedure. A Minimum amount of DAHLILA, PFAHLER ([id number]) bleeding was controlled with Pressure. The procedure was tolerated well with a pain level of 0 throughout and a pain level of 0 following the procedure. Post Debridement Measurements: 0.6cm length x 0.7cm width x 0.2cm depth; 0.066cm^3 volume. Character of Wound/Ulcer Post Debridement is improved. Severity of Tissue Post Debridement is: Fat layer exposed. Post procedure Diagnosis Wound #1: Same as Pre-Procedure Plan Wound Cleansing: Wound #1 Right,Lateral Malleolus: Clean wound with Normal Saline. Cleanse wound with mild soap and water May Shower,  gently pat wound dry prior to applying new dressing. Anesthetic (add to Medication List): Wound #1 Right,Lateral Malleolus: Topical Lidocaine 4% cream applied to wound bed prior to debridement (In Clinic Only). Primary Wound Dressing: Wound #1 Right,Lateral Malleolus: Prisma Ag Secondary Dressing: Wound #1 Right,Lateral Malleolus: Dry Gauze Conform/Kerlix Other - stretch net Dressing Change Frequency: Wound #1 Right,Lateral Malleolus: Change dressing every other day. Follow-up Appointments: Wound #1 Right,Lateral Malleolus: Return Appointment in 2 weeks. Edema Control: Wound #1 Right,Lateral Malleolus: Patient to wear own Juxtalite/Juzo compression garment. Elevate legs to the level of the heart and pump ankles as often as possible Off-Loading: Wound #1 Right,Lateral Malleolus: Turn and reposition every 2 hours Other: - do not put pressure on your right ankle Additional Orders / Instructions: Wound #1 Right,Lateral Malleolus: Increase protein intake. Other: - Add Vitamin C, Zinc, Vitamin A to your diet We will continue with the Prisma currently since patient is doing very well on with this dressing and we will see were things stand in two weeks time. Please see above for specific wound care orders. We will see patient for re-evaluation in 1 week(s) here in the clinic. If anything worsens or changes patient will contact our office for additional recommendations. LANEKA, MCGRORY ([id number]) Electronic Signature(s) Signed: 01/26/2018 12:37:14 PM By: Worthy Keeler PA-C Entered By: Worthy Keeler on 01/25/2018 13:10:22 Bibian, [name] ([id number]) -------------------------------------------------------------------------------- ROS/PFSH Details Patient Name: [name] Date of Service: 01/25/2018 12:30 PM Medical Record Number: [id number] Patient Account Number: [id number] Date of Birth/Sex: [birth year] (84 y.o. Female) Treating RN: Montey Hora Primary Care  Provider: FITZGERALD, DAVID Other Clinician: Referring Provider: FITZGERALD, DAVID Treating Provider/Extender: STONE III, Heike Pounds Weeks in Treatment: 24 Information Obtained From Patient Wound History Do you currently have one or more open woundso Yes How many open wounds do you currently haveo 1 Approximately how long have you had your woundso 2 yrs How have you been treating your wound(s) until nowo mupirocin, soaking in epsom salt Has your wound(s) ever healed and then re-openedo No Have you had any lab work done in the past montho No Have you tested positive for an antibiotic resistant organism (MRSA, VRE)o No Have you tested positive for osteomyelitis (bone infection)o No Have you had any tests for  circulation on your legso Yes Who ordered the testo Dr. Lucky Cowboy Where was the test doneo [facility] Constitutional Symptoms (General Health) Complaints and Symptoms: Negative for: Fever; Chills Cardiovascular Complaints and Symptoms: Positive for: LE edema Medical History: Positive for: Congestive Heart Failure; Hypertension Eyes Medical History: Positive for: Cataracts - surgery Respiratory Complaints and Symptoms: No Complaints or Symptoms Musculoskeletal Medical History: Positive for: Osteoarthritis Neurologic Medical History: Positive for: Neuropathy Oncologic TERRYL, NIZIOLEK ([id number]) Medical History: Negative for: Received Chemotherapy; Received Radiation Psychiatric Complaints and Symptoms: No Complaints or Symptoms HBO Extended History Items Eyes: Cataracts Immunizations Pneumococcal Vaccine: Received Pneumococcal Vaccination: Yes Implantable Devices Family and Social History Cancer: Yes - Father,Siblings; Diabetes: No; Heart Disease: Yes - Siblings; Hereditary Spherocytosis: No; Hypertension: No; Kidney Disease: No; Lung Disease: No; Seizures: No; Stroke: No; Thyroid Problems: No; Tuberculosis: No; Never smoker; Marital Status - Married; Alcohol Use: Never;  Drug Use: No History; Caffeine Use: Rarely; Financial Concerns: No; Food, Clothing or Shelter Needs: No; Support System Lacking: No; Transportation Concerns: No; Advanced Directives: No; Patient does not want information on Advanced Directives; Do not resuscitate: No; Living Will: Yes (Not Provided); Medical Power of Attorney: No Physician Affirmation I have reviewed and agree with the above information. Electronic Signature(s) Signed: 01/25/2018 5:21:02 PM By: Montey Hora Signed: 01/26/2018 12:37:14 PM By: Worthy Keeler PA-C Entered By: Worthy Keeler on 01/25/2018 13:09:44 Schedler, [name] ([id number]) -------------------------------------------------------------------------------- SuperBill Details Patient Name: [name] Date of Service: 01/25/2018 Medical Record Number: [id number] Patient Account Number: [id number] Date of Birth/Sex: 05/10/1923 (84 y.o. 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 I89.0 Lymphedema, not elsewhere classified B35.4 Tinea corporis Facility Procedures CPT4 Code: 22025427 Description: 06237 - DEB SUBQ TISSUE 20 SQ CM/< ICD-10 Diagnosis Description S28.315 Non-pressure chronic ulcer of right ankle with fat layer ex Modifier: posed Quantity: 1 Physician Procedures CPT4 Code: 1761607 Description: 37106 - WC PHYS SUBQ TISS 20 SQ CM ICD-10 Diagnosis Description Y69.485 Non-pressure chronic ulcer of right ankle with fat layer ex Modifier: posed Quantity: 1 Electronic Signature(s) Signed: 01/26/2018 12:37:14 PM By: Worthy Keeler PA-C Entered By: Worthy Keeler on 01/25/2018 13:10:43

## 2018-01-29 NOTE — Progress Notes (Signed)
[name], [name] ([id number]) Visit Report for 01/25/2018 Arrival Information Details Patient Name: [name], [name]. Date of Service: 01/25/2018 12:30 PM Medical Record Number: [id number] Patient Account Number: [id number] Date of Birth/Sex: [birth year] (84 y.o. Female) Treating RN: Roger Shelter Primary Care Emine Lopata: FITZGERALD, DAVID Other Clinician: Referring Malini Flemings: FITZGERALD, DAVID Treating Kimberlyn Quiocho/Extender: STONE III, HOYT Weeks in Treatment: 24 Visit Information History Since Last Visit All ordered tests and consults were completed: No Patient Arrived: Ambulatory Added or deleted any medications: No Arrival Time: 12:39 Any new allergies or adverse reactions: No Accompanied By: self Had a fall or experienced change in No Transfer Assistance: None activities of daily living that may affect Patient Requires Transmission-Based No risk of falls: Precautions: Signs or symptoms of abuse/neglect since last visito No Patient Has Alerts: No Hospitalized since last visit: No Pain Present Now: No Electronic Signature(s) Signed: 01/25/2018 4:58:29 PM By: Roger Shelter Entered By: Roger Shelter on 01/25/2018 12:39:42 Sarti, [name] ([id number]) -------------------------------------------------------------------------------- Encounter Discharge Information Details Patient Name: [name] Date of Service: 01/25/2018 12:30 PM Medical Record Number: [id number] Patient Account Number: [id number] Date of Birth/Sex: 03-14-1923 (84 y.o. Female) Treating RN: Montey Hora Primary Care Imraan Wendell: FITZGERALD, DAVID Other Clinician: Referring Kirston Luty: FITZGERALD, DAVID Treating Clariece Roesler/Extender: STONE III, HOYT Weeks in Treatment: 24 Encounter Discharge Information Items Discharge Pain Level: 0 Discharge Condition: Stable Ambulatory Status: Ambulatory Discharge Destination: Home Transportation: Private Auto Accompanied By: dtr Schedule Follow-up Appointment:  Yes Medication Reconciliation completed and No provided to Patient/Care Dyllin Gulley: Provided on Clinical Summary of Care: 01/25/2018 Form Type Recipient Paper Patient WS Electronic Signature(s) Signed: 01/29/2018 8:07:32 AM By: Ruthine Dose Entered By: Ruthine Dose on 01/25/2018 13:13:33 Ouderkirk, [name] ([id number]) -------------------------------------------------------------------------------- Lower Extremity Assessment Details Patient Name: [name] Date of Service: 01/25/2018 12:30 PM Medical Record Number: [id number] Patient Account Number: [id number] Date of Birth/Sex: 1923-02-07 (84 y.o. Female) Treating RN: Roger Shelter Primary Care Mase Dhondt: FITZGERALD, DAVID Other Clinician: Referring Valeta Paz: FITZGERALD, DAVID Treating Darald Uzzle/Extender: STONE III, HOYT Weeks in Treatment: 24 Vascular Assessment Pulses: Dorsalis Pedis Palpable: [Right:Yes] Posterior Tibial Extremity colors, hair growth, and conditions: Extremity Color: [Right:Hyperpigmented] Temperature of Extremity: [Right:Warm] Capillary Refill: [Right:< 3 seconds] Toe Nail Assessment Left: Right: Thick: Yes Discolored: Yes Deformed: Yes Improper Length and Hygiene: Yes Electronic Signature(s) Signed: 01/25/2018 4:58:29 PM By: Roger Shelter Entered By: Roger Shelter on 01/25/2018 12:45:24 Salyers, [name] ([id number]) -------------------------------------------------------------------------------- Multi Wound Chart Details Patient Name: [name] Date of Service: 01/25/2018 12:30 PM Medical Record Number: [id number] Patient Account Number: [id number] Date of Birth/Sex: 05-09-1923 (84 y.o. Female) Treating RN: Montey Hora Primary Care Lofton Leon: FITZGERALD, DAVID Other Clinician: Referring Harmony Sandell: FITZGERALD, DAVID Treating Everardo Voris/Extender: STONE III, HOYT Weeks in Treatment: 24 Vital Signs Height(in): 65 Pulse(bpm): 92 Weight(lbs): 154.3 Blood Pressure(mmHg):  131/69 Body Mass Index(BMI): 26 Temperature(F): 98.1 Respiratory Rate 16 (breaths/min): Photos: [1:No Photos] [N/A:N/A] Wound Location: [1:Right, Lateral Malleolus] [N/A:N/A] Wounding Event: [1:Gradually Appeared] [N/A:N/A] Primary Etiology: [1:Lymphedema] [N/A:N/A] Secondary Etiology: [1:Arterial Insufficiency Ulcer] [N/A:N/A] Comorbid History: [1:Cataracts, Congestive Heart Failure, Hypertension, Osteoarthritis, Neuropathy] [N/A:N/A] Date Acquired: [1:08/11/2015] [N/A:N/A] Weeks of Treatment: [1:24] [N/A:N/A] Wound Status: [1:Open] [N/A:N/A] Measurements L x W x D [1:0.6x0.7x0.1] [N/A:N/A] (cm) Area (cm) : [1:0.33] [N/A:N/A] Volume (cm) : [1:0.033] [N/A:N/A] % Reduction in Area: [1:78.50%] [N/A:N/A] % Reduction in Volume: [1:92.80%] [N/A:N/A] Classification: [1:Full Thickness Without Exposed Support Structures] [N/A:N/A] Exudate Amount: [1:Large] [N/A:N/A] Exudate Type: [1:Serous] [N/A:N/A] Exudate Color: [1:amber] [N/A:N/A] Wound Margin: [1:Distinct, outline attached] [N/A:N/A] Granulation Amount: [1:Large (67-100%)] [N/A:N/A] Granulation Quality: [1:Red] [N/A:N/A] Necrotic  Amount: [1:Small (1-33%)] [N/A:N/A] Exposed Structures: [1:Fat Layer (Subcutaneous Tissue) Exposed: Yes] [N/A:N/A] Epithelialization: [1:None] [N/A:N/A] Periwound Skin Texture: [1:Excoriation: Yes Induration: No Callus: No Crepitus: No Rash: No Scarring: No] [N/A:N/A] Periwound Skin Moisture: [1:Maceration: No Dry/Scaly: No] [N/A:N/A] Periwound Skin Color: Erythema: Yes N/A N/A Rubor: Yes Atrophie Blanche: No Cyanosis: No Ecchymosis: No Hemosiderin Staining: No Mottled: No Pallor: No Erythema Location: Circumferential N/A N/A Temperature: No Abnormality N/A N/A Tenderness on Palpation: Yes N/A N/A Wound Preparation: Ulcer Cleansing: N/A N/A Rinsed/Irrigated with Saline Topical Anesthetic Applied: Other: lidocaine 4% Treatment Notes Electronic Signature(s) Signed: 01/25/2018 5:21:02 PM  By: Montey Hora Entered By: Montey Hora on 01/25/2018 12:58:27 Greth, [name] ([id number]) -------------------------------------------------------------------------------- [place] Details Patient Name: [name] Date of Service: 01/25/2018 12:30 PM Medical Record Number: [id number] Patient Account Number: [id number] Date of Birth/Sex: August 17, 1923 (84 y.o. Female) Treating RN: Montey Hora Primary Care Carine Nordgren: FITZGERALD, DAVID Other Clinician: Referring Annaleese Guier: FITZGERALD, DAVID Treating Adarsh Mundorf/Extender: STONE III, HOYT Weeks in Treatment: 24 Active Inactive ` Abuse / Safety / Falls / Self Care Management Nursing Diagnoses: Potential for falls Goals: Patient will not experience any injury related to falls Date Initiated: 08/10/2017 Target Resolution Date: 11/10/2017 Goal Status: Active Interventions: Assess Activities of Daily Living upon admission and as needed Assess fall risk on admission and as needed Assess: immobility, friction, shearing, incontinence upon admission and as needed Notes: ` Nutrition Nursing Diagnoses: Imbalanced nutrition Potential for alteratiion in Nutrition/Potential for imbalanced nutrition Goals: Patient/caregiver agrees to and verbalizes understanding of need to use nutritional supplements and/or vitamins as prescribed Date Initiated: 08/10/2017 Target Resolution Date: 12/08/2017 Goal Status: Active Interventions: Assess patient nutrition upon admission and as needed per policy Notes: ` Orientation to the Wound Care Program Nursing Diagnoses: Knowledge deficit related to the wound healing center program Goals: Patient/caregiver will verbalize understanding of the [facility] Program Date Initiated: 08/10/2017 Target Resolution Date: 09/08/2017 [name], [name] ([id number]) Goal Status: Active Interventions: Provide education on orientation to the wound center Notes: ` Pain, Acute or  Chronic Nursing Diagnoses: Pain, acute or chronic: actual or potential Potential alteration in comfort, pain Goals: Patient/caregiver will verbalize adequate pain control between visits Date Initiated: 08/10/2017 Target Resolution Date: 12/08/2017 Goal Status: Active Interventions: Complete pain assessment as per visit requirements Notes: ` Wound/Skin Impairment Nursing Diagnoses: Impaired tissue integrity Knowledge deficit related to ulceration/compromised skin integrity Goals: Ulcer/skin breakdown will have a volume reduction of 80% by week 12 Date Initiated: 08/10/2017 Target Resolution Date: 12/01/2017 Goal Status: Active Interventions: Assess patient/caregiver ability to perform ulcer/skin care regimen upon admission and as needed Notes: Electronic Signature(s) Signed: 01/25/2018 5:21:02 PM By: Montey Hora Entered By: Montey Hora on 01/25/2018 12:58:15 Underberg, [name] ([id number]) -------------------------------------------------------------------------------- Pain Assessment Details Patient Name: [name] Date of Service: 01/25/2018 12:30 PM Medical Record Number: [id number] Patient Account Number: [id number] Date of Birth/Sex: 1923/06/24 (84 y.o. Female) Treating RN: Roger Shelter Primary Care Aleayah Chico: FITZGERALD, DAVID Other Clinician: Referring Noralee Dutko: FITZGERALD, DAVID Treating Nakea Gouger/Extender: STONE III, HOYT Weeks in Treatment: 24 Active Problems Location of Pain Severity and Description of Pain Patient Has Paino No Site Locations Pain Management and Medication Current Pain Management: Electronic Signature(s) Signed: 01/25/2018 4:58:29 PM By: Roger Shelter Entered By: Roger Shelter on 01/25/2018 12:39:49 Stevick, [name] ([id number]) -------------------------------------------------------------------------------- Patient/Caregiver Education Details Patient Name: [name] Date of Service: 01/25/2018 12:30 PM Medical  Record Number: [id number] Patient Account Number: [id number] Date of Birth/Gender: 09/10/1923 (84 y.o. Female) Treating RN: Montey Hora Primary Care  Physician: FITZGERALD, DAVID Other Clinician: Referring Physician: FITZGERALD, DAVID Treating Physician/Extender: Melburn Hake, HOYT Weeks in Treatment: 24 Education Assessment Education Provided To: Patient and Caregiver Education Topics Provided Wound/Skin Impairment: Handouts: Other: wound care as ordered Methods: Demonstration, Explain/Verbal Responses: State content correctly Electronic Signature(s) Signed: 01/25/2018 5:21:02 PM By: Montey Hora Entered By: Montey Hora on 01/25/2018 13:12:41 Dubow, [name] ([id number]) -------------------------------------------------------------------------------- Wound Assessment Details Patient Name: [name] Date of Service: 01/25/2018 12:30 PM Medical Record Number: [id number] Patient Account Number: [id number] Date of Birth/Sex: 06/13/1923 (84 y.o. Female) Treating RN: Montey Hora Primary Care Laurinda Carreno: FITZGERALD, DAVID Other Clinician: Referring Gregary Blackard: FITZGERALD, DAVID Treating Shelden Raborn/Extender: STONE III, HOYT Weeks in Treatment: 24 Wound Status Wound Number: 1 Primary Lymphedema Etiology: Wound Location: Right, Lateral Malleolus Secondary Arterial Insufficiency Ulcer Wounding Event: Gradually Appeared Etiology: Date Acquired: 08/11/2015 Wound Status: Open Weeks Of Treatment: 24 Comorbid Cataracts, Congestive Heart Failure, Clustered Wound: No History: Hypertension, Osteoarthritis, Neuropathy Photos Photo Uploaded By: Alric Quan on 01/25/2018 17:02:07 Wound Measurements Length: (cm) 0.6 Width: (cm) 0.7 Depth: (cm) 0.1 Area: (cm) 0.33 Volume: (cm) 0.033 % Reduction in Area: 78.5% % Reduction in Volume: 92.8% Epithelialization: None Wound Description Full Thickness Without Exposed Support Classification: Structures Wound Margin:  Distinct, outline attached Exudate Large Amount: Exudate Type: Serous Exudate Color: amber Foul Odor After Cleansing: No Slough/Fibrino Yes Wound Bed Granulation Amount: Large (67-100%) Exposed Structure Granulation Quality: Red Fat Layer (Subcutaneous Tissue) Exposed: Yes Necrotic Amount: Small (1-33%) Necrotic Quality: Adherent Slough Periwound Skin Texture Texture Color Stowers, [name] ([id number]) No Abnormalities Noted: No No Abnormalities Noted: No Callus: No Atrophie Blanche: No Crepitus: No Cyanosis: No Excoriation: Yes Ecchymosis: No Induration: No Erythema: Yes Rash: No Erythema Location: Circumferential Scarring: No Hemosiderin Staining: No Mottled: No Moisture Pallor: No No Abnormalities Noted: No Rubor: Yes Dry / Scaly: No Maceration: No Temperature / Pain Temperature: No Abnormality Tenderness on Palpation: Yes Wound Preparation Ulcer Cleansing: Rinsed/Irrigated with Saline Topical Anesthetic Applied: Other: lidocaine 4%, Treatment Notes Wound #1 (Right, Lateral Malleolus) 1. Cleansed with: Clean wound with Normal Saline 2. Anesthetic Topical Lidocaine 4% cream to wound bed prior to debridement 4. Dressing Applied: Prisma Ag 5. Secondary Dressing Applied Dry Gauze Kerlix/Conform 7. Secured with Tape Notes stretchnet, Juxtalite Electronic Signature(s) Signed: 01/25/2018 5:21:02 PM By: Montey Hora Entered By: Montey Hora on 01/25/2018 12:58:04 [name] ([id number]) -------------------------------------------------------------------------------- Vitals Details Patient Name: [name] Date of Service: 01/25/2018 12:30 PM Medical Record Number: [id number] Patient Account Number: [id number] Date of Birth/Sex: [birth year] (84 y.o. Female) Treating RN: Roger Shelter Primary Care Tessy Pawelski: FITZGERALD, DAVID Other Clinician: Referring Deborah Dondero: FITZGERALD, DAVID Treating Jerime Arif/Extender: STONE III, HOYT Weeks in  Treatment: 24 Vital Signs Time Taken: 12:39 Temperature (F): 98.1 Height (in): 65 Pulse (bpm): 92 Weight (lbs): 154.3 Respiratory Rate (breaths/min): 16 Body Mass Index (BMI): 25.7 Blood Pressure (mmHg): 131/69 Reference Range: 80 - 120 mg / dl Electronic Signature(s) Signed: 01/25/2018 4:58:29 PM By: Roger Shelter Entered By: Roger Shelter on 01/25/2018 12:40:07

## 2018-02-01 ENCOUNTER — Encounter: Payer: Medicare Other | Admitting: Physician Assistant

## 2018-02-08 ENCOUNTER — Encounter: Payer: Medicare Other | Attending: Physician Assistant | Admitting: Physician Assistant

## 2018-02-08 DIAGNOSIS — I509 Heart failure, unspecified: Secondary | ICD-10-CM | POA: Diagnosis not present

## 2018-02-08 DIAGNOSIS — Z85828 Personal history of other malignant neoplasm of skin: Secondary | ICD-10-CM | POA: Diagnosis not present

## 2018-02-08 DIAGNOSIS — Z809 Family history of malignant neoplasm, unspecified: Secondary | ICD-10-CM | POA: Diagnosis not present

## 2018-02-08 DIAGNOSIS — I890 Lymphedema, not elsewhere classified: Secondary | ICD-10-CM | POA: Insufficient documentation

## 2018-02-08 DIAGNOSIS — L97312 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d: Secondary | ICD-10-CM | POA: Diagnosis not present

## 2018-02-08 DIAGNOSIS — B354 Tinea corporis: Secondary | ICD-10-CM | POA: Insufficient documentation

## 2018-02-08 DIAGNOSIS — I70233 Atherosclerosis of native arteries of right leg with ulceration of ankle: Secondary | ICD-10-CM | POA: Insufficient documentation

## 2018-02-08 DIAGNOSIS — I110 Hypertensive heart disease with heart failure: Secondary | ICD-10-CM | POA: Diagnosis not present

## 2018-02-08 DIAGNOSIS — M1990 Unspecified osteoarthritis, unspecified site: Secondary | ICD-10-CM | POA: Insufficient documentation

## 2018-02-08 DIAGNOSIS — G629 Polyneuropathy, unspecified: Secondary | ICD-10-CM | POA: Insufficient documentation

## 2018-02-08 DIAGNOSIS — Z950 Presence of cardiac pacemaker: Secondary | ICD-10-CM | POA: Insufficient documentation

## 2018-02-08 DIAGNOSIS — Z9049 Acquired absence of other specified parts of digestive tract: Secondary | ICD-10-CM | POA: Insufficient documentation

## 2018-02-08 DIAGNOSIS — Z8249 Family history of ischemic heart disease and other diseases of the circulatory system: Secondary | ICD-10-CM | POA: Insufficient documentation

## 2018-02-12 NOTE — Progress Notes (Signed)
BREKLYN, FABRIZIO ([id number]) Visit Report for 02/08/2018 Chief Complaint Document Details Patient Name: [name], [name]. Date of Service: 02/08/2018 12:30 PM Medical Record Number: [id number] Patient Account Number: [id number] Date of Birth/Sex: 16-Apr-1923 (84 y.o. Female) Treating RN: Montey Hora Primary Care Provider: FITZGERALD, DAVID Other Clinician: Referring Provider: FITZGERALD, DAVID Treating Provider/Extender: STONE III, Corey Caulfield Weeks in Treatment: 26 Information Obtained from: Patient Chief Complaint Patient presents to the wound care center for a consult due non healing wound to the right lateral malleolus Electronic Signature(s) Signed: 02/08/2018 6:07:32 PM By: Worthy Keeler PA-C Entered By: Worthy Keeler on 02/08/2018 12:50:41 Caudillo, [name] ([id number]) -------------------------------------------------------------------------------- Debridement Details Patient Name: [name] Date of Service: 02/08/2018 12:30 PM Medical Record Number: [id number] Patient Account Number: [id number] Date of Birth/Sex: Sep 25, 1923 (84 y.o. Female) Treating RN: Montey Hora Primary Care Provider: FITZGERALD, DAVID Other Clinician: Referring Provider: FITZGERALD, DAVID Treating Provider/Extender: STONE III, Jozlyn Schatz Weeks in Treatment: 26 Debridement Performed for Wound #1 Right,Lateral Malleolus Assessment: Performed By: Physician STONE III, Tommaso Cavitt E., PA-C Debridement: Debridement Severity of Tissue Pre Fat layer exposed Debridement: Pre-procedure Verification/Time Yes - 12:54 Out Taken: Start Time: 12:54 Pain Control: Lidocaine 4% Topical Solution Level: Skin/Subcutaneous Tissue Total Area Debrided (L x W): 0.6 (cm) x 0.7 (cm) = 0.42 (cm) Tissue and other material Viable, Non-Viable, Fibrin/Slough, Other, Subcutaneous debrided: Instrument: Curette Bleeding: Minimum Hemostasis Achieved: Pressure End Time: 12:57 Procedural Pain: 0 Post Procedural Pain:  0 Response to Treatment: Procedure was tolerated well Post Debridement Measurements of Total Wound Length: (cm) 0.6 Width: (cm) 0.7 Depth: (cm) 0.2 Volume: (cm) 0.066 Character of Wound/Ulcer Post Debridement: Improved Severity of Tissue Post Debridement: Fat layer exposed Post Procedure Diagnosis Same as Pre-procedure Electronic Signature(s) Signed: 02/08/2018 4:45:57 PM By: Montey Hora Signed: 02/08/2018 6:07:32 PM By: Worthy Keeler PA-C Entered By: Montey Hora on 02/08/2018 12:59:44 Trice, [name] ([id number]) -------------------------------------------------------------------------------- HPI Details Patient Name: [name] Date of Service: 02/08/2018 12:30 PM Medical Record Number: [id number] Patient Account Number: [id number] Date of Birth/Sex: August 17, 1923 (84 y.o. 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 Dr. Amalia Hailey the podiatrist saw her on 07/20/2017 and sent her to the wound center for possible hyperbaric oxygen therapy. past medical history of peripheral vascular disease, varicose veins, status post appendectomy, basal cell carcinoma excision from the left leg, cholecystectomy, pacemaker placement, right lower extremity angiography done by Dr. dew in March 2017 with placement of a stent. there is also note of a successful ablation of the right small saphenous vein done which was reviewed by ultrasound on 10/24/2016. the patient had a right small saphenous vein ablation done on 10/20/2016. The patient has never been a smoker. She has been seen by Dr. Corene Cornea dew the  vascular surgeon who most recently saw her on 06/15/2017 for evaluation of ongoing problems with right leg swelling. She had a lower extremity arterial duplex examination done(02/13/17) which showed patent distal right superficial femoral artery stent and above-the-knee popliteal stent without evidence of restenosis. The ABI was more than 1.3 on the right and more than 1.3 on the left. This was consistent with noncompressible arteries due to medial calcification. The right great toe pressure and PPG  waveforms are within normal limits and the left great toe pressure and PPG waveforms are decreased. he recommended she continue to wear her compression stockings and continue with elevation. She is scheduled to have a noninvasive arterial study in the near future 08/16/2017 -- had a lower extremity arterial duplex examination done which showed patent distal right superficial femoral artery stent and above-the-knee popliteal stent without evidence of restenosis. The ABI was more than 1.3 on the right and more than 1.3 on the left. This was consistent with noncompressible arteries due to medial calcification. The right great toe pressure and PPG waveforms are within normal limits and the left great toe pressure and PPG waveforms are decreased. the x-ray of the right ankle has not yet been done 08/24/2017 -- had a right ankle x-ray -- IMPRESSION:1. No fracture, bone lesion or evidence of osteomyelitis. 2. Lateral soft tissue swelling with a soft tissue ulcer. she has not yet seen the vascular surgeon for review 08/31/17 on evaluation today patient's wound appears to be showing signs of improvement. She still with her appointment with vascular in order to review her results of her vascular study and then determine if any intervention would be recommended at that time. No fevers, chills, nausea, or vomiting noted at this time. She has been tolerating the dressing changes without complication. 09/28/17 on  evaluation today patient's wound appears to show signs of good improvement in regard to the granulation tissue which is surfacing. There is still a layer of slough covering the wound and the posterior portion is still significantly deeper than the anterior nonetheless there has been some good sign of things moving towards the better. She is going to go back to Dr. dew for reevaluation to ensure her blood flow is still appropriate. That will be before her next evaluation with [place] next week. No fevers, chills, nausea, or vomiting noted at this time. Patient does have some discomfort rated to be a 3-4/10 depending on activity specifically cleansing the wound makes it worse. 10/05/2017 -- the patient was seen by Dr. Lucky Cowboy last week and noninvasive studies showed a normal right ABI with brisk triphasic waveforms consistent with no arterial insufficiency including normal digital pressures. The duplex showed a patent distal right SFA stent and the proximal SFA was also normal. He was pleased with her test and thought she should have enough of perfusion for normal wound healing. He would see her back in 6 months time. 12/21/17 on evaluation today patient appears to be doing fairly well in regard to her right lateral ankle wound. Unfortunately the main issue that she is expansion at this point is that she is having some issues with what appears to be some cellulitis in the ANNAMARIE, YAMAGUCHI. ([id number]) right anterior shin. She has also been noting a little bit of uncomfortable feeling especially last night and her ankle area. I'm afraid that she made the developing a little bit of an infection. With that being said I think it is in the early stages. 12/28/17 on evaluation today patient's ankle appears to be doing excellent. She's making good progress at this point the cellulitis seems to have improved after last week's evaluation. Overall she is having no significant discomfort which is excellent news. She does  have an appointment with Dr. dew on March 29, 2018 for reevaluation in regard to the stent he placed. She seems to have excellent blood flow in the right lower extremity. 01/19/12 on evaluation today patient's wound appears to be doing very well.  In fact she does not appear to require debridement at this point, there's no evidence of infection, and overall from the standpoint of the wound she seems to be doing very well. With that being said I believe that it may be time to switch to different dressing away from the [facility] Dressing she tells me she does have a lot going on her friend actually passed away yesterday and she's also having a lot of issues with her husband this obviously is weighing heavy on her as far as your thoughts and concerns today. 01/25/18 on evaluation today patient appears to be doing fairly well in regard to her right lateral malleolus. She has been tolerating the dressing changes without complication. Overall I feel like this is definitely showing signs of improvement as far as how the overall appearance of the wound is there's also evidence of epithelium start to migrate over the granulation tissue. In general I think that she is progressing nicely as far as the wound is concerned. The only concern she really has is whether or not we can switch to every other week visits in order to avoid having as many appointments as her daughters have a difficult time getting her to her appointments as well as the patient's husband to his he is not doing very well at this point. 02/08/18 on evaluation today patient presents for follow-up concerning her ongoing right lateral malleolus ulcer. She has been tolerating the dressing changes without complication. The only main issue that she had was that of last week shortly after I saw her in fact that was two weeks ago she had an issue with some bleeding when removing the dressing. This sounds as if the dressing was getting stuck and in  fact she's had a lot of issues with the dressing getting stuck in the interim since that point. Fortunately there does not appear to be any evidence of infection at this time. The wound is also filled in nicely to where it's at surface level as far as the granulation bed is concerned. Electronic Signature(s) Signed: 02/08/2018 6:07:32 PM By: Worthy Keeler PA-C Entered By: Worthy Keeler on 02/08/2018 13:52:15 Cooperman, [name] ([id number]) -------------------------------------------------------------------------------- Physical Exam Details Patient Name: [name] Date of Service: 02/08/2018 12:30 PM Medical Record Number: [id number] Patient Account Number: [id number] Date of Birth/Sex: 07-Oct-1923 (84 y.o. Female) Treating RN: Montey Hora Primary Care Provider: FITZGERALD, DAVID Other Clinician: Referring Provider: FITZGERALD, DAVID Treating Provider/Extender: STONE III, Jatavion Peaster Weeks in Treatment: 7 Constitutional Well-nourished and well-hydrated in no acute distress. Respiratory normal breathing without difficulty. Psychiatric this patient is able to make decisions and demonstrates good insight into disease process. Alert and Oriented x 3. pleasant and cooperative. Notes Patient's wound bed at this point shows an excellent granulation base she does have some Slough/biofilm noted on the surface of the wound which did require sharp debridement was also some dry skin and dressing stuck to the periwound at this point. This was all debrided away and cleaned up nicely during the office visit today and post debridement the wound bed appear to be doing excellent. Electronic Signature(s) Signed: 02/08/2018 6:07:32 PM By: Worthy Keeler PA-C Entered By: Worthy Keeler on 02/08/2018 13:53:11 Ensey, [name] ([id number]) -------------------------------------------------------------------------------- Physician Orders Details Patient Name: [name] Date of Service:  02/08/2018 12:30 PM Medical Record Number: [id number] Patient Account Number: [id number] Date of Birth/Sex: 28-Aug-1923 (84 y.o. 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 o Cleanse wound with mild soap and water o May Shower, gently pat wound dry prior to applying new dressing. Anesthetic (add to Medication List) Wound #1 Right,Lateral Malleolus o Topical Lidocaine 4% cream applied to wound bed prior to debridement (In Clinic Only). Primary Wound Dressing Wound #1 Right,Lateral Malleolus o Hydrafera Blue Secondary Dressing Wound #1 Right,Lateral Malleolus o Dry Gauze o Conform/Kerlix o Other - stretch net Dressing Change Frequency Wound #1 Right,Lateral Malleolus o Change dressing every other day. Follow-up Appointments Wound #1 Right,Lateral Malleolus o Return Appointment in 2 weeks. Edema Control Wound #1 Right,Lateral Malleolus o Patient to wear own Juxtalite/Juzo compression garment. o Elevate legs to the level of the heart and pump ankles as often as possible Jarchow, [name] ([id number]) Off-Loading Wound #1 Right,Lateral Malleolus o Turn and reposition every 2 hours o Other: - do not put pressure on your right ankle Additional Orders / Instructions Wound #1 Right,Lateral Malleolus o Increase protein intake. o Other: - Add Vitamin C, Zinc, Vitamin A to your diet Electronic Signature(s) Signed: 02/08/2018  4:45:57 PM By: Montey Hora Signed: 02/08/2018 6:07:32 PM By: Worthy Keeler PA-C Entered By: Montey Hora on 02/08/2018 12:57:13 Ono, [name] ([id number]) -------------------------------------------------------------------------------- Problem List Details Patient Name: [name] Date of Service: 02/08/2018 12:30 PM Medical Record Number: [id number] Patient Account Number: [id number] Date of Birth/Sex: Jan 05, 1923 (84 y.o. Female) Treating RN: Montey Hora Primary Care Provider: FITZGERALD, DAVID Other Clinician: Referring Provider: FITZGERALD, DAVID Treating Provider/Extender: STONE III, Lynx Goodrich Weeks in Treatment: 26 Active Problems ICD-10 Encounter Code Description Active Date Diagnosis L97.312 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d 08/10/2017 Yes I70.233 Atherosclerosis of native arteries of right leg with ulceration of ankle 08/10/2017 Yes I89.0 Lymphedema, not elsewhere classified 08/10/2017 Yes B35.4 Tinea corporis 09/28/2017 Yes Inactive Problems Resolved Problems Electronic Signature(s) Signed: 02/08/2018 6:07:32 PM By: Worthy Keeler PA-C Entered By: Worthy Keeler on 02/08/2018 12:50:36 Wing, [name] ([id number]) -------------------------------------------------------------------------------- Progress Note Details Patient Name: [name] Date of Service: 02/08/2018 12:30 PM Medical Record Number: [id number] Patient Account Number: [id number] Date of Birth/Sex: 12-26-1922 (84 y.o. Female) Treating RN: Montey Hora Primary Care Provider: FITZGERALD, DAVID Other Clinician: Referring Provider: FITZGERALD, DAVID Treating Provider/Extender: STONE III, Acacia Latorre Weeks in Treatment: 26 Subjective Chief Complaint Information obtained from Patient Patient presents to the wound care center for a consult due non healing wound to the right lateral malleolus History of Present Illness (HPI) 84 year old patient who most recently has  been seeing both podiatry and vascular surgery for a long-standing ulcer of her right lateral malleolus which has been treated with various methodologies. Dr. Amalia Hailey the podiatrist saw her on 07/20/2017 and sent her to the wound center for possible hyperbaric oxygen therapy. past medical history of peripheral vascular disease, varicose veins, status post appendectomy, basal cell carcinoma excision from the left leg, cholecystectomy, pacemaker placement, right lower extremity angiography done by Dr. dew in March 2017 with placement of a stent. there is also note of a successful ablation of the right small saphenous vein done which was reviewed by ultrasound on 10/24/2016. the patient had a  right small saphenous vein ablation done on 10/20/2016. The patient has never been a smoker. She has been seen by Dr. Corene Cornea dew the vascular surgeon who most recently saw her on 06/15/2017 for evaluation of ongoing problems with right leg swelling. She had a lower extremity arterial duplex examination done(02/13/17) which showed patent distal right superficial femoral artery stent and above-the-knee popliteal stent without evidence of restenosis. The ABI was more than 1.3 on the right and more than 1.3 on the left. This was consistent with noncompressible arteries due to medial calcification. The right great toe pressure and PPG waveforms are within normal limits and the left great toe pressure and PPG waveforms are decreased. he recommended she continue to wear her compression stockings and continue with elevation. She is scheduled to have a noninvasive arterial study in the near future 08/16/2017 -- had a lower extremity arterial duplex examination done which showed patent distal right superficial femoral artery stent and above-the-knee popliteal stent without evidence of restenosis. The ABI was more than 1.3 on the right and more than 1.3 on the left. This was consistent with noncompressible arteries due to  medial calcification. The right great toe pressure and PPG waveforms are within normal limits and the left great toe pressure and PPG waveforms are decreased. the x-ray of the right ankle has not yet been done 08/24/2017 -- had a right ankle x-ray -- IMPRESSION:1. No fracture, bone lesion or evidence of osteomyelitis. 2. Lateral soft tissue swelling with a soft tissue ulcer. she has not yet seen the vascular surgeon for review 08/31/17 on evaluation today patient's wound appears to be showing signs of improvement. She still with her appointment with vascular in order to review her results of her vascular study and then determine if any intervention would be recommended at that time. No fevers, chills, nausea, or vomiting noted at this time. She has been tolerating the dressing changes without complication. 09/28/17 on evaluation today patient's wound appears to show signs of good improvement in regard to the granulation tissue which is surfacing. There is still a layer of slough covering the wound and the posterior portion is still significantly deeper than the anterior nonetheless there has been some good sign of things moving towards the better. She is going to go back to Dr. dew for reevaluation to ensure her blood flow is still appropriate. That will be before her next evaluation with [place] next week. No fevers, chills, nausea, or vomiting noted at this time. Patient does have some discomfort rated to be a 3-4/10 depending on activity specifically cleansing the wound makes it worse. 10/05/2017 -- the patient was seen by Dr. Lucky Cowboy last week and noninvasive studies showed a normal right ABI with brisk TEARAH, SAULSBURY. ([id number]) triphasic waveforms consistent with no arterial insufficiency including normal digital pressures. The duplex showed a patent distal right SFA stent and the proximal SFA was also normal. He was pleased with her test and thought she should have enough of perfusion for  normal wound healing. He would see her back in 6 months time. 12/21/17 on evaluation today patient appears to be doing fairly well in regard to her right lateral ankle wound. Unfortunately the main issue that she is expansion at this point is that she is having some issues with what appears to be some cellulitis in the right anterior shin. She has also been noting a little bit of uncomfortable feeling especially last night and her ankle area. I'm afraid that she made the developing a  little bit of an infection. With that being said I think it is in the early stages. 12/28/17 on evaluation today patient's ankle appears to be doing excellent. She's making good progress at this point the cellulitis seems to have improved after last week's evaluation. Overall she is having no significant discomfort which is excellent news. She does have an appointment with Dr. dew on March 29, 2018 for reevaluation in regard to the stent he placed. She seems to have excellent blood flow in the right lower extremity. 01/19/12 on evaluation today patient's wound appears to be doing very well. In fact she does not appear to require debridement at this point, there's no evidence of infection, and overall from the standpoint of the wound she seems to be doing very well. With that being said I believe that it may be time to switch to different dressing away from the [facility] Dressing she tells me she does have a lot going on her friend actually passed away yesterday and she's also having a lot of issues with her husband this obviously is weighing heavy on her as far as your thoughts and concerns today. 01/25/18 on evaluation today patient appears to be doing fairly well in regard to her right lateral malleolus. She has been tolerating the dressing changes without complication. Overall I feel like this is definitely showing signs of improvement as far as how the overall appearance of the wound is there's also evidence of  epithelium start to migrate over the granulation tissue. In general I think that she is progressing nicely as far as the wound is concerned. The only concern she really has is whether or not we can switch to every other week visits in order to avoid having as many appointments as her daughters have a difficult time getting her to her appointments as well as the patient's husband to his he is not doing very well at this point. 02/08/18 on evaluation today patient presents for follow-up concerning her ongoing right lateral malleolus ulcer. She has been tolerating the dressing changes without complication. The only main issue that she had was that of last week shortly after I saw her in fact that was two weeks ago she had an issue with some bleeding when removing the dressing. This sounds as if the dressing was getting stuck and in fact she's had a lot of issues with the dressing getting stuck in the interim since that point. Fortunately there does not appear to be any evidence of infection at this time. The wound is also filled in nicely to where it's at surface level as far as the granulation bed is concerned. Patient History Information obtained from Patient. Family History Cancer - Father,Siblings, Heart Disease - Siblings, No family history of Diabetes, Hereditary Spherocytosis, Hypertension, Kidney Disease, Lung Disease, Seizures, Stroke, Thyroid Problems, Tuberculosis. Social History Never smoker, Marital Status - Married, Alcohol Use - Never, Drug Use - No History, Caffeine Use - Rarely. Review of Systems (ROS) Constitutional Symptoms (General Health) Denies complaints or symptoms of Fever, Chills. Respiratory The patient has no complaints or symptoms. Cardiovascular The patient has no complaints or symptoms. Psychiatric The patient has no complaints or symptoms. AARVI, STOTTS ([id number]) Objective Constitutional Well-nourished and well-hydrated in no acute distress. Vitals  Time Taken: 12:41 PM, Height: 65 in, Weight: 154.3 lbs, BMI: 25.7, Temperature: 98.4 F, Pulse: 91 bpm, Respiratory Rate: 16 breaths/min, Blood Pressure: 129/64 mmHg. Respiratory normal breathing without difficulty. Psychiatric this patient is able to make decisions and  demonstrates good insight into disease process. Alert and Oriented x 3. pleasant and cooperative. General Notes: Patient's wound bed at this point shows an excellent granulation base she does have some Slough/biofilm noted on the surface of the wound which did require sharp debridement was also some dry skin and dressing stuck to the periwound at this point. This was all debrided away and cleaned up nicely during the office visit today and post debridement the wound bed appear to be doing excellent. Integumentary (Hair, Skin) Wound #1 status is Open. Original cause of wound was Gradually Appeared. The wound is located on the Right,Lateral Malleolus. The wound measures 0.6cm length x 0.7cm width x 0.1cm depth; 0.33cm^2 area and 0.033cm^3 volume. There is Fat Layer (Subcutaneous Tissue) Exposed exposed. There is no tunneling or undermining noted. There is a large amount of serous drainage noted. The wound margin is distinct with the outline attached to the wound base. There is large (67-100%) red granulation within the wound bed. There is a small (1-33%) amount of necrotic tissue within the wound bed including Adherent Slough. The periwound skin appearance exhibited: Excoriation, Rubor, Erythema. The periwound skin appearance did not exhibit: Callus, Crepitus, Induration, Rash, Scarring, Dry/Scaly, Maceration, Atrophie Blanche, Cyanosis, Ecchymosis, Hemosiderin Staining, Mottled, Pallor. The surrounding wound skin color is noted with erythema which is circumferential. Periwound temperature was noted as No Abnormality. The periwound has tenderness on palpation. 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 There was a Skin/Subcutaneous Tissue Debridement ([id number]) debridement with total area of 0.42 sq cm performed by STONE III, Ekam Besson E., PA-C. with the following instrument(s): Curette to remove Viable and Non-Viable tissue/material including Fibrin/Slough, Other, and Subcutaneous after achieving pain control using Lidocaine 4% Topical Solution. A time out was conducted at 12:54, prior to the start of the procedure. A Minimum amount of bleeding was controlled with Pressure. The procedure was tolerated well with a pain level of 0 throughout and a pain level of 0 following the procedure. Post Debridement Measurements: 0.6cm length x 0.7cm width x 0.2cm depth; 0.066cm^3 volume. Character of Wound/Ulcer Post Debridement is improved. Severity of Tissue Post Debridement is: Fat layer exposed. Post procedure Diagnosis Wound #1: Same as Pre-Procedure Plan Wound Cleansing: Wound #1 Right,Lateral Malleolus: Clean wound with Normal Saline. Cleanse wound with mild soap and water May Shower, gently pat wound dry prior to applying new dressing. Anesthetic (add to Medication List): Wound #1 Right,Lateral Malleolus: Topical Lidocaine 4% cream applied to wound bed prior to debridement (In Clinic Only). Primary Wound Dressing: Wound #1 Right,Lateral Malleolus: Hydrafera Blue Secondary Dressing: Wound #1 Right,Lateral Malleolus: Dry Gauze Conform/Kerlix Other - stretch net Dressing Change Frequency: Wound #1 Right,Lateral Malleolus: Change dressing every other day. Follow-up Appointments: Wound #1  Right,Lateral Malleolus: Return Appointment in 2 weeks. Edema Control: Wound #1 Right,Lateral Malleolus: Patient to wear own Juxtalite/Juzo compression garment. Elevate legs to the level of the heart and pump ankles as often as possible Off-Loading: Wound #1 Right,Lateral Malleolus: Turn and reposition every 2 hours Other: - do not put pressure on your right ankle Additional Orders / Instructions: Wound #1 Right,Lateral Malleolus: Increase protein intake. Other: - Add Vitamin  C, Zinc, Vitamin A to your diet TENIYA, FILTER ([id number]) I'm gonna recommend currently that we switch back to the [facility] Dressing she has done well with this in the past and since she did do so well I'm hopeful that will continue to show signs of improvement as far as this wound is concerned. We will discontinue the Prisma. Please see above for specific wound care orders. We will see patient for re-evaluation in 1 week(s) here in the clinic. If anything worsens or changes patient will contact our office for additional recommendations. Electronic Signature(s) Signed: 02/08/2018 6:07:32 PM By: Worthy Keeler PA-C Entered By: Worthy Keeler on 02/08/2018 13:53:41 Cadmus, [name] ([id number]) -------------------------------------------------------------------------------- ROS/PFSH Details Patient Name: [name] Date of Service: 02/08/2018 12:30 PM Medical Record Number: [id number] Patient Account Number: [id number] Date of Birth/Sex: October 21, 1923 (84 y.o. Female) Treating RN: Montey Hora Primary Care Provider: FITZGERALD, DAVID Other Clinician: Referring Provider: FITZGERALD, DAVID Treating Provider/Extender: STONE III, Ordean Fouts Weeks in Treatment: 26 Information Obtained From Patient Wound History Do you currently have one or more open woundso Yes How many open wounds do you currently haveo 1 Approximately how long have you had your woundso 2 yrs How have you been treating your  wound(s) until nowo mupirocin, soaking in epsom salt Has your wound(s) ever healed and then re-openedo No Have you had any lab work done in the past montho No Have you tested positive for an antibiotic resistant organism (MRSA, VRE)o No Have you tested positive for osteomyelitis (bone infection)o No Have you had any tests for circulation on your legso Yes Who ordered the testo Dr. Lucky Cowboy [facility] the test [facility] Constitutional Symptoms (General Health) Complaints and Symptoms: Negative for: Fever; Chills Eyes Medical History: Positive for: Cataracts - surgery Respiratory Complaints and Symptoms: No Complaints or Symptoms Cardiovascular Complaints and Symptoms: No Complaints or Symptoms Medical History: Positive for: Congestive Heart Failure; Hypertension Musculoskeletal Medical History: Positive for: Osteoarthritis Neurologic Medical History: Positive for: Neuropathy Oncologic MRYTLE, BENTO ([id number]) Medical History: Negative for: Received Chemotherapy; Received Radiation Psychiatric Complaints and Symptoms: No Complaints or Symptoms HBO Extended History Items Eyes: Cataracts Immunizations Pneumococcal Vaccine: Received Pneumococcal Vaccination: Yes Implantable Devices Family and Social History Cancer: Yes - Father,Siblings; Diabetes: No; Heart Disease: Yes - Siblings; Hereditary Spherocytosis: No; Hypertension: No; Kidney Disease: No; Lung Disease: No; Seizures: No; Stroke: No; Thyroid Problems: No; Tuberculosis: No; Never smoker; Marital Status - Married; Alcohol Use: Never; Drug Use: No History; Caffeine Use: Rarely; Financial Concerns: No; Food, Clothing or Shelter Needs: No; Support System Lacking: No; Transportation Concerns: No; Advanced Directives: No; Patient does not want information on Advanced Directives; Do not resuscitate: No; Living Will: Yes (Not Provided); Medical Power of Attorney: No Electronic Signature(s) Signed: 02/08/2018 4:45:57 PM By:  Montey Hora Signed: 02/08/2018 6:07:32 PM By: Worthy Keeler PA-C Entered By: Worthy Keeler on 02/08/2018 13:52:35 Kruschke, [name] ([id number]) -------------------------------------------------------------------------------- SuperBill Details Patient Name: [name] Date of Service: 02/08/2018 Medical Record Number: [id number] Patient Account Number: [id number] Date of Birth/Sex: Apr 10, 1923 (84 y.o. 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 I89.0 Lymphedema, not elsewhere classified B35.4 Tinea corporis Facility Procedures CPT4 Code: [id number] Description: 29518 - DEB SUBQ TISSUE  20 SQ CM/< ICD-10 Diagnosis Description K87.681 Non-pressure chronic ulcer of right ankle with fat layer ex Modifier: posed Quantity: 1 Physician Procedures CPT4 Code: 1572620 Description: 35597 - WC PHYS SUBQ TISS 20 SQ CM ICD-10 Diagnosis Description C16.384 Non-pressure chronic ulcer of right ankle with fat layer ex Modifier: posed Quantity: 1 Electronic Signature(s) Signed: 02/08/2018 6:07:32 PM By: Worthy Keeler PA-C Entered By: Worthy Keeler on 02/08/2018 13:53:58

## 2018-02-14 NOTE — Progress Notes (Signed)
[name], [name] ([id number]) Visit Report for 02/08/2018 Arrival Information Details Patient Name: [name], [name] Date of Service: 02/08/2018 12:30 PM Medical Record Number: [id number] Patient Account Number: [id number] Date of Birth/Sex: 1923/09/17 (84 y.o. Female) Treating RN: Roger Shelter Primary Care Denym Christenberry: FITZGERALD, DAVID Other Clinician: Referring [name]: FITZGERALD, DAVID Treating Shareen Capwell/Extender: STONE III, HOYT Weeks in Treatment: 26 Visit Information History Since Last Visit All ordered tests and consults were completed: No Patient Arrived: Ambulatory Added or deleted any medications: No Arrival Time: 12:40 Any new allergies or adverse reactions: No Accompanied By: self Had a fall or experienced change in No Transfer Assistance: None activities of daily living that may affect Patient Identification Verified: Yes risk of falls: Secondary Verification Process Completed: Yes Signs or symptoms of abuse/neglect since last visito No Patient Requires Transmission-Based No Hospitalized since last visit: No Precautions: Pain Present Now: No Patient Has Alerts: No Electronic Signature(s) Signed: 02/13/2018 8:28:46 AM By: Roger Shelter Entered By: Roger Shelter on 02/08/2018 12:41:17 [name], [name] ([id number]) -------------------------------------------------------------------------------- Encounter Discharge Information Details Patient Name: [name] Date of Service: 02/08/2018 12:30 PM Medical Record Number: [id number] Patient Account Number: [id number] Date of Birth/Sex: 10/06/1923 (84 y.o. Female) Treating RN: Ahmed Prima Primary Care [name]: FITZGERALD, DAVID Other Clinician: Referring [name]: FITZGERALD, DAVID Treating Desiree Fleming/Extender: STONE III, HOYT Weeks in Treatment: 35 Encounter Discharge Information Items Discharge Pain Level: 0 Discharge Condition: Stable Ambulatory Status: Ambulatory Discharge Destination:  Home Transportation: Private Auto Accompanied By: daughter Schedule Follow-up Appointment: Yes Medication Reconciliation completed and No provided to Patient/Care [name]: Provided on Clinical Summary of Care: 02/08/2018 Form Type Recipient Paper Patient WS Electronic Signature(s) Signed: 02/11/2018 8:29:01 AM By: Ruthine Dose Entered By: Ruthine Dose on 02/08/2018 13:05:06 [name], [name] ([id number]) -------------------------------------------------------------------------------- Lower Extremity Assessment Details Patient Name: [name] Date of Service: 02/08/2018 12:30 PM Medical Record Number: [id number] Patient Account Number: [id number] Date of Birth/Sex: 02-Aug-1923 (84 y.o. Female) Treating RN: Roger Shelter Primary Care Kehinde Totzke: FITZGERALD, DAVID Other Clinician: Referring [name]: FITZGERALD, DAVID Treating Joelle Roswell/Extender: STONE III, HOYT Weeks in Treatment: 26 Vascular Assessment Pulses: Dorsalis Pedis Palpable: [Right:Yes] Posterior Tibial Extremity colors, hair growth, and conditions: Extremity Color: [Right:Hyperpigmented] Hair Growth on Extremity: [Right:No] Temperature of Extremity: [Right:Warm] Capillary Refill: [Right:< 3 seconds] Toe Nail Assessment Left: Right: Thick: Yes Discolored: Yes Deformed: No Improper Length and Hygiene: No Electronic Signature(s) Signed: 02/13/2018 8:28:46 AM By: Roger Shelter Entered By: Roger Shelter on 02/08/2018 12:43:58 [name], [name] ([id number]) -------------------------------------------------------------------------------- Multi Wound Chart Details Patient Name: [name] Date of Service: 02/08/2018 12:30 PM Medical Record Number: [id number] Patient Account Number: [id number] Date of Birth/Sex: 1923/10/08 (84 y.o. Female) Treating RN: Montey Hora Primary Care Raine Blodgett: FITZGERALD, DAVID Other Clinician: Referring [name]: FITZGERALD, DAVID Treating Nyja Westbrook/Extender:  STONE III, HOYT Weeks in Treatment: 26 Vital Signs Height(in): 65 Pulse(bpm): 91 Weight(lbs): 154.3 Blood Pressure(mmHg): 129/64 Body Mass Index(BMI): 26 Temperature(F): 98.4 Respiratory Rate 16 (breaths/min): Photos: [1:No Photos] [N/A:N/A] Wound Location: [1:Right Malleolus - Lateral] [N/A:N/A] Wounding Event: [1:Gradually Appeared] [N/A:N/A] Primary Etiology: [1:Lymphedema] [N/A:N/A] Secondary Etiology: [1:Arterial Insufficiency Ulcer] [N/A:N/A] Comorbid History: [1:Cataracts, Congestive Heart Failure, Hypertension, Osteoarthritis, Neuropathy] [N/A:N/A] Date Acquired: [1:08/11/2015] [N/A:N/A] Weeks of Treatment: [1:26] [N/A:N/A] Wound Status: [1:Open] [N/A:N/A] Measurements L x W x D [1:0.6x0.7x0.1] [N/A:N/A] (cm) Area (cm) : [1:0.33] [N/A:N/A] Volume (cm) : [1:0.033] [N/A:N/A] % Reduction in Area: [1:78.50%] [N/A:N/A] % Reduction in Volume: [1:92.80%] [N/A:N/A] Classification: [1:Full Thickness Without Exposed Support Structures] [N/A:N/A] Exudate Amount: [1:Large] [N/A:N/A] Exudate Type: [1:Serous] [N/A:N/A] Exudate Color: [1:amber] [N/A:N/A] Wound  Margin: [1:Distinct, outline attached] [N/A:N/A] Granulation Amount: [1:Large (67-100%)] [N/A:N/A] Granulation Quality: [1:Red] [N/A:N/A] Necrotic Amount: [1:Small (1-33%)] [N/A:N/A] Exposed Structures: [1:Fat Layer (Subcutaneous Tissue) Exposed: Yes] [N/A:N/A] Epithelialization: [1:None] [N/A:N/A] Periwound Skin Texture: [1:Excoriation: Yes Induration: No Callus: No Crepitus: No Rash: No Scarring: No] [N/A:N/A] Periwound Skin Moisture: [1:Maceration: No Dry/Scaly: No] [N/A:N/A] Periwound Skin Color: Erythema: Yes N/A N/A Rubor: Yes Atrophie Blanche: No Cyanosis: No Ecchymosis: No Hemosiderin Staining: No Mottled: No Pallor: No Erythema Location: Circumferential N/A N/A Temperature: No Abnormality N/A N/A Tenderness on Palpation: Yes N/A N/A Wound Preparation: Ulcer Cleansing: N/A N/A Rinsed/Irrigated with  Saline Topical Anesthetic Applied: Other: lidocaine 4% Treatment Notes Electronic Signature(s) Signed: 02/08/2018 4:45:57 PM By: Montey Hora Entered By: Montey Hora on 02/08/2018 12:53:27 [name], [name] ([id number]) -------------------------------------------------------------------------------- [place] Details Patient Name: [name] Date of Service: 02/08/2018 12:30 PM Medical Record Number: [id number] Patient Account Number: [id number] Date of Birth/Sex: October 03, 1923 (84 y.o. Female) Treating RN: Montey Hora Primary Care Halei Hanover: FITZGERALD, DAVID Other Clinician: Referring Fran Neiswonger: FITZGERALD, DAVID Treating Fergie Sherbert/Extender: STONE III, HOYT Weeks in Treatment: 26 Active Inactive ` Abuse / Safety / Falls / Self Care Management Nursing Diagnoses: Potential for falls Goals: Patient will not experience any injury related to falls Date Initiated: 08/10/2017 Target Resolution Date: 11/10/2017 Goal Status: Active Interventions: Assess Activities of Daily Living upon admission and as needed Assess fall risk on admission and as needed Assess: immobility, friction, shearing, incontinence upon admission and as needed Notes: ` Nutrition Nursing Diagnoses: Imbalanced nutrition Potential for alteratiion in Nutrition/Potential for imbalanced nutrition Goals: Patient/caregiver agrees to and verbalizes understanding of need to use nutritional supplements and/or vitamins as prescribed Date Initiated: 08/10/2017 Target Resolution Date: 12/08/2017 Goal Status: Active Interventions: Assess patient nutrition upon admission and as needed per policy Notes: ` Orientation to the Wound Care Program Nursing Diagnoses: Knowledge deficit related to the wound healing center program Goals: Patient/caregiver will verbalize understanding of the [place] Program Date Initiated: 08/10/2017 Target Resolution Date: 09/08/2017 ARIYANNAH, PAULING  ([id number]) Goal Status: Active Interventions: Provide education on orientation to the wound center Notes: ` Pain, Acute or Chronic Nursing Diagnoses: Pain, acute or chronic: actual or potential Potential alteration in comfort, pain Goals: Patient/caregiver will verbalize adequate pain control between visits Date Initiated: 08/10/2017 Target Resolution Date: 12/08/2017 Goal Status: Active Interventions: Complete pain assessment as per visit requirements Notes: ` Wound/Skin Impairment Nursing Diagnoses: Impaired tissue integrity Knowledge deficit related to ulceration/compromised skin integrity Goals: Ulcer/skin breakdown will have a volume reduction of 80% by week 12 Date Initiated: 08/10/2017 Target Resolution Date: 12/01/2017 Goal Status: Active Interventions: Assess patient/caregiver ability to perform ulcer/skin care regimen upon admission and as needed Notes: Electronic Signature(s) Signed: 02/08/2018 4:45:57 PM By: Montey Hora Entered By: Montey Hora on 02/08/2018 12:53:18 Coste, [name] ([id number]) -------------------------------------------------------------------------------- Pain Assessment Details Patient Name: [name] Date of Service: 02/08/2018 12:30 PM Medical Record Number: [id number] Patient Account Number: [id number] Date of Birth/Sex: 05-06-1923 (84 y.o. Female) Treating RN: Roger Shelter Primary Care Marcia Hartwell: FITZGERALD, DAVID Other Clinician: Referring Jezreel Justiniano: FITZGERALD, DAVID Treating Diamond Jentz/Extender: STONE III, HOYT Weeks in Treatment: 26 Active Problems Location of Pain Severity and Description of Pain Patient Has Paino No Site Locations Pain Management and Medication Current Pain Management: Notes has occasional shooting pain at night Electronic Signature(s) Signed: 02/13/2018 8:28:46 AM By: Roger Shelter Entered By: Roger Shelter on 02/08/2018 12:41:38 Hopwood, [name]  ([id number]) -------------------------------------------------------------------------------- Patient/Caregiver Education Details Patient Name: [name] Date of Service: 02/08/2018 12:30  PM Medical Record Number: [id number] Patient Account Number: [id number] Date of Birth/Gender: 1923/06/18 (84 y.o. Female) Treating RN: Ahmed Prima Primary Care Physician: FITZGERALD, DAVID Other Clinician: Referring Physician: FITZGERALD, DAVID Treating Physician/Extender: Melburn Hake, HOYT Weeks in Treatment: 26 Education Assessment Education Provided To: Patient Education Topics Provided Wound/Skin Impairment: Handouts: Caring for Your Ulcer, Other: change dressing as ordered Methods: Demonstration, Explain/Verbal Responses: State content correctly Electronic Signature(s) Signed: 02/08/2018 4:41:33 PM By: Alric Quan Entered By: Alric Quan on 02/08/2018 13:04:25 [employer], [name] ([id number]) -------------------------------------------------------------------------------- Wound Assessment Details Patient Name: [name] Date of Service: 02/08/2018 12:30 PM Medical Record Number: [id number] Patient Account Number: [id number] Date of Birth/Sex: 1923/11/16 (84 y.o. Female) Treating RN: Roger Shelter Primary Care Josede Cicero: FITZGERALD, DAVID Other Clinician: Referring Jillyn Stacey: FITZGERALD, DAVID Treating Julliana Whitmyer/Extender: STONE III, HOYT Weeks in Treatment: 26 Wound Status Wound Number: 1 Primary Lymphedema Etiology: Wound Location: Right Malleolus - Lateral Secondary Arterial Insufficiency Ulcer Wounding Event: Gradually Appeared Etiology: Date Acquired: 08/11/2015 Wound Status: Open Weeks Of Treatment: 26 Comorbid Cataracts, Congestive Heart Failure, Clustered Wound: No History: Hypertension, Osteoarthritis, Neuropathy Photos Photo Uploaded By: Roger Shelter on 02/08/2018 16:21:51 Wound Measurements Length: (cm) 0.6 Width: (cm) 0.7 Depth: (cm)  0.1 Area: (cm) 0.33 Volume: (cm) 0.033 % Reduction in Area: 78.5% % Reduction in Volume: 92.8% Epithelialization: None Tunneling: No Undermining: No Wound Description Full Thickness Without Exposed Support Classification: Structures Wound Margin: Distinct, outline attached Exudate Large Amount: Exudate Type: Serous Exudate Color: amber Foul Odor After Cleansing: No Slough/Fibrino Yes Wound Bed Granulation Amount: Large (67-100%) Exposed Structure Granulation Quality: Red Fat Layer (Subcutaneous Tissue) Exposed: Yes Necrotic Amount: Small (1-33%) Necrotic Quality: Adherent Slough Periwound Skin Texture Texture Color Flenniken, [name] ([id number]) No Abnormalities Noted: No No Abnormalities Noted: No Callus: No Atrophie Blanche: No Crepitus: No Cyanosis: No Excoriation: Yes Ecchymosis: No Induration: No Erythema: Yes Rash: No Erythema Location: Circumferential Scarring: No Hemosiderin Staining: No Mottled: No Moisture Pallor: No No Abnormalities Noted: No Rubor: Yes Dry / Scaly: No Maceration: No Temperature / Pain Temperature: No Abnormality Tenderness on Palpation: Yes Wound Preparation Ulcer Cleansing: Rinsed/Irrigated with Saline Topical Anesthetic Applied: Other: lidocaine 4%, Treatment Notes Wound #1 (Right, Lateral Malleolus) 1. Cleansed with: Clean wound with Normal Saline 2. Anesthetic Topical Lidocaine 4% cream to wound bed prior to debridement 4. Dressing Applied: Hydrafera Blue 5. Secondary Dressing Applied Dry Gauze Kerlix/Conform 7. Secured with Tape Notes stretchnet, Juxtalite Electronic Signature(s) Signed: 02/13/2018 8:28:46 AM By: Roger Shelter Entered By: Roger Shelter on 02/08/2018 12:47:38 Vieyra, [name] ([id number]) -------------------------------------------------------------------------------- Vitals Details Patient Name: [name] Date of Service: 02/08/2018 12:30 PM Medical Record Number:  [id number] Patient Account Number: [id number] Date of Birth/Sex: Apr 07, 1923 (84 y.o. Female) Treating RN: Roger Shelter Primary Care Farheen Pfahler: FITZGERALD, DAVID Other Clinician: Referring Jullianna Gabor: FITZGERALD, DAVID Treating Tyree Vandruff/Extender: STONE III, HOYT Weeks in Treatment: 26 Vital Signs Time Taken: 12:41 Temperature (F): 98.4 Height (in): 65 Pulse (bpm): 91 Weight (lbs): 154.3 Respiratory Rate (breaths/min): 16 Body Mass Index (BMI): 25.7 Blood Pressure (mmHg): 129/64 Reference Range: 80 - 120 mg / dl Electronic Signature(s) Signed: 02/13/2018 8:28:46 AM By: Roger Shelter Entered By: Roger Shelter on 02/08/2018 12:42:33

## 2018-02-22 ENCOUNTER — Encounter: Payer: Medicare Other | Admitting: Physician Assistant

## 2018-02-22 DIAGNOSIS — L97312 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d: Secondary | ICD-10-CM | POA: Diagnosis not present

## 2018-02-24 NOTE — Progress Notes (Signed)
JERICCA, RUSSETT ([id number]) Visit Report for 02/22/2018 Chief Complaint Document Details Patient Name: [name], [name]. Date of Service: 02/22/2018 12:30 PM Medical Record Number: [id number] Patient Account Number: [id number] Date of Birth/Sex: [birth year] (84 y.o. F) Treating RN: Montey Hora Primary Care Provider: FITZGERALD, DAVID Other Clinician: Referring Provider: FITZGERALD, DAVID Treating Provider/Extender: STONE III, HOYT Weeks in Treatment: 28 Information Obtained from: Patient Chief Complaint Patient presents to the wound care center for a consult due non healing wound to the right lateral malleolus Electronic Signature(s) Signed: 02/24/2018 11:36:05 PM By: Worthy Keeler PA-C Entered By: Worthy Keeler on 02/22/2018 12:53:21 Daisey, [name] ([id number]) -------------------------------------------------------------------------------- HPI Details Patient Name: [name] Date of Service: 02/22/2018 12:30 PM Medical Record Number: [id number] Patient Account Number: [id number] Date of Birth/Sex: June 02, 1923 (84 y.o. 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 Dr. Amalia Hailey the podiatrist saw her on 07/20/2017 and sent her to the wound center for possible hyperbaric oxygen therapy. past medical history of peripheral vascular disease, varicose veins, status post appendectomy, basal cell carcinoma excision from the left leg, cholecystectomy, pacemaker placement, right lower extremity angiography done by Dr. dew in March 2017 with placement of a stent. there is also note of a  successful ablation of the right small saphenous vein done which was reviewed by ultrasound on 10/24/2016. the patient had a right small saphenous vein ablation done on 10/20/2016. The patient has never been a smoker. She has been seen by Dr. Corene Cornea dew the vascular surgeon who most recently saw her on 06/15/2017 for evaluation of ongoing problems with right leg swelling. She had a lower extremity arterial duplex examination done(02/13/17) which showed patent distal right superficial femoral artery stent and above-the-knee popliteal stent without evidence of restenosis. The ABI was more than 1.3 on the right and more than 1.3 on the left. This was consistent with noncompressible arteries due to medial calcification. The right great toe pressure and PPG waveforms are within normal limits and the left great toe pressure and PPG waveforms are decreased. he recommended she continue to wear her compression stockings and continue with elevation. She is scheduled to have a noninvasive arterial study in the near future 08/16/2017 -- had a lower extremity arterial duplex examination done which showed patent distal right superficial femoral artery stent and above-the-knee popliteal stent without evidence of restenosis. The ABI was more than 1.3 on the right and more than 1.3 on the left. This was consistent with noncompressible arteries due to medial calcification. The right great toe pressure and PPG waveforms are within normal limits and the left great toe pressure and PPG waveforms are decreased. the x-ray of the right ankle has not yet been done 08/24/2017 -- had a right ankle x-ray -- IMPRESSION:1. No fracture, bone lesion or evidence of osteomyelitis. 2. Lateral soft tissue swelling with a soft tissue ulcer. she has not yet seen the vascular surgeon for review 08/31/17 on evaluation today patient's wound appears to be showing signs of improvement. She still with her appointment with vascular in order to  review her results of her vascular study and then determine if any intervention would be recommended at that time. No fevers, chills, nausea, or  vomiting noted at this time. She has been tolerating the dressing changes without complication. 09/28/17 on evaluation today patient's wound appears to show signs of good improvement in regard to the granulation tissue which is surfacing. There is still a layer of slough covering the wound and the posterior portion is still significantly deeper than the anterior nonetheless there has been some good sign of things moving towards the better. She is going to go back to Dr. dew for reevaluation to ensure her blood flow is still appropriate. That will be before her next evaluation with [place] next week. No fevers, chills, nausea, or vomiting noted at this time. Patient does have some discomfort rated to be a 3-4/10 depending on activity specifically cleansing the wound makes it worse. 10/05/2017 -- the patient was seen by Dr. Lucky Cowboy last week and noninvasive studies showed a normal right ABI with brisk triphasic waveforms consistent with no arterial insufficiency including normal digital pressures. The duplex showed a patent distal right SFA stent and the proximal SFA was also normal. He was pleased with her test and thought she should have enough of perfusion for normal wound healing. He would see her back in 6 months time. 12/21/17 on evaluation today patient appears to be doing fairly well in regard to her right lateral ankle wound. Unfortunately the main issue that she is expansion at this point is that she is having some issues with what appears to be some cellulitis in the [name], [name]. ([id number]) right anterior shin. She has also been noting a little bit of uncomfortable feeling especially last night and her ankle area. I'm afraid that she made the developing a little bit of an infection. With that being said I think it is in the early stages. 12/28/17 on  evaluation today patient's ankle appears to be doing excellent. She's making good progress at this point the cellulitis seems to have improved after last week's evaluation. Overall she is having no significant discomfort which is excellent news. She does have an appointment with Dr. dew on March 29, 2018 for reevaluation in regard to the stent he placed. She seems to have excellent blood flow in the right lower extremity. 01/19/12 on evaluation today patient's wound appears to be doing very well. In fact she does not appear to require debridement at this point, there's no evidence of infection, and overall from the standpoint of the wound she seems to be doing very well. With that being said I believe that it may be time to switch to different dressing away from the [name] Dressing she tells me she does have a lot going on her friend actually passed away yesterday and she's also having a lot of issues with her husband this obviously is weighing heavy on her as far as your thoughts and concerns today. 01/25/18 on evaluation today patient appears to be doing fairly well in regard to her right lateral malleolus. She has been tolerating the dressing changes without complication. Overall I feel like this is definitely showing signs of improvement as far as how the overall appearance of the wound is there's also evidence of epithelium start to migrate over the granulation tissue. In general I think that she is progressing nicely as far as the wound is concerned. The only concern she really has is whether or not we can switch to every other week visits in order to avoid having as many appointments as her daughters have a difficult time getting her to her appointments as well as  the patient's husband to his he is not doing very well at this point. 02/22/18 on evaluation today patient's right lateral malleolus ulcer appears to be doing great. She has been tolerating the dressing changes without  complication. Overall you making excellent progress at this time. Patient is having no significant discomfort. Electronic Signature(s) Signed: 02/24/2018 11:36:05 PM By: Worthy Keeler PA-C Entered By: Worthy Keeler on 02/22/2018 13:08:41 Brien, [name] ([id number]) -------------------------------------------------------------------------------- Physical Exam Details Patient Name: [name] Date of Service: 02/22/2018 12:30 PM Medical Record Number: [id number] Patient Account Number: [id number] Date of Birth/Sex: September 16, 1923 (84 y.o. F) Treating RN: Montey Hora Primary Care Provider: FITZGERALD, DAVID Other Clinician: Referring Provider: FITZGERALD, DAVID Treating Provider/Extender: STONE III, HOYT Weeks in Treatment: 14 Constitutional Well-nourished and well-hydrated in no acute distress. Respiratory normal breathing without difficulty. clear to auscultation bilaterally. Cardiovascular regular rate and rhythm with normal S1, S2. Psychiatric this patient is able to make decisions and demonstrates good insight into disease process. Alert and Oriented x 3. pleasant and cooperative. Notes Currently after cleansing with saline and gauze mechanically to breeding the wound the area appears to be excellent but no sharp debridement was needed. Overall I'm very pleased with how things seem to have progressed at this point. He continues to take care of the wound very well. Electronic Signature(s) Signed: 02/24/2018 11:36:05 PM By: Worthy Keeler PA-C Entered By: Worthy Keeler on 02/22/2018 13:09:26 [name], [name] ([id number]) -------------------------------------------------------------------------------- Physician Orders Details Patient Name: [name] Date of Service: 02/22/2018 12:30 PM Medical Record Number: [id number] Patient Account Number: [id number] Date of Birth/Sex: [birth year] (84 y.o. 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 o Cleanse wound with mild soap and water o May Shower, gently pat wound dry prior to applying new dressing. Anesthetic (add to Medication List) Wound #1 Right,Lateral Malleolus o Topical Lidocaine 4% cream applied to wound bed prior to debridement (In Clinic Only). Primary Wound Dressing Wound #1 Right,Lateral Malleolus o Hydrafera Blue Ready Transfer Secondary Dressing Wound #1 Right,Lateral Malleolus o Dry Gauze o Conform/Kerlix o Other - stretch net Dressing Change Frequency Wound #1 Right,Lateral Malleolus o Change dressing every other day. Follow-up Appointments Wound #1 Right,Lateral Malleolus o Return Appointment in 3 weeks. Edema Control Wound #1 Right,Lateral Malleolus o Elevate legs to the level of the heart and pump ankles as often as possible Off-Loading [place], [name] ([id number]) Wound #1 Right,Lateral Malleolus o Turn and reposition every 2 hours o Other: - do not put pressure on your right ankle Additional Orders / Instructions Wound #1 Right,Lateral Malleolus o Increase protein intake. o Other: - Add Vitamin C, Zinc, Vitamin A to your diet Electronic Signature(s) Signed: 02/22/2018 4:19:12 PM By: Montey Hora Signed: 02/24/2018 11:36:05 PM By: Worthy Keeler PA-C Entered By: Montey Hora on 02/22/2018 13:00:29 Wimberly, [name]  ([id number]) -------------------------------------------------------------------------------- Problem List Details Patient Name: [name] Date of Service: 02/22/2018 12:30 PM Medical Record Number: [id number] Patient Account Number: [id number] Date of Birth/Sex: [birth year] (84 y.o. F) Treating RN: Montey Hora Primary Care Provider: FITZGERALD, DAVID Other Clinician:  Referring Provider: FITZGERALD, DAVID Treating Provider/Extender: STONE III, HOYT Weeks in Treatment: 28 Active Problems ICD-10 Impacting Encounter Code Description Active Date Wound Healing Diagnosis L97.312 Non-pressure chronic ulcer of right ankle with fat layer 08/10/2017 Yes exposed I70.233 Atherosclerosis of native arteries of right leg with ulceration of 08/10/2017 Yes ankle I89.0 Lymphedema, not elsewhere classified 08/10/2017 Yes B35.4 Tinea corporis 09/28/2017 Yes Inactive Problems Resolved Problems Electronic Signature(s) Signed: 02/24/2018 11:36:05 PM By: Worthy Keeler PA-C Entered By: Worthy Keeler on 02/22/2018 12:53:11 [name], [name] ([id number]) -------------------------------------------------------------------------------- Progress Note Details Patient Name: [name] Date of Service: 02/22/2018 12:30 PM Medical Record Number: [id number] Patient Account Number: [id number] Date of Birth/Sex: [birth year] (84 y.o. F) Treating RN: Montey Hora Primary Care Provider: FITZGERALD, DAVID Other Clinician: Referring Provider: FITZGERALD, DAVID Treating Provider/Extender: STONE III, HOYT Weeks in Treatment: 28 Subjective Chief Complaint Information obtained from Patient Patient presents to the wound care center for a consult due non healing wound to the right lateral malleolus History of Present Illness (HPI) 84 year old patient who most recently has been seeing both podiatry and vascular surgery for a long-standing ulcer of her right lateral malleolus which has been treated with  various methodologies. Dr. Amalia Hailey the podiatrist saw her on 07/20/2017 and sent her to the wound center for possible hyperbaric oxygen therapy. past medical history of peripheral vascular disease, varicose veins, status post appendectomy, basal cell carcinoma excision from the left leg, cholecystectomy, pacemaker placement, right lower extremity angiography done by Dr. dew in March 2017 with placement of a stent. there is also note of a successful ablation of the right small saphenous vein done which was reviewed by ultrasound on 10/24/2016. the patient had a right small saphenous vein ablation done on 10/20/2016. The patient has never been a smoker. She has been seen by Dr. Corene Cornea dew the vascular surgeon who most recently saw her on 06/15/2017 for evaluation of ongoing problems with right leg swelling. She had a lower extremity arterial duplex examination done(02/13/17) which showed patent distal right superficial femoral artery stent and above-the-knee popliteal stent without evidence of restenosis. The ABI was more than 1.3 on the right and more than 1.3 on the left. This was consistent with noncompressible arteries due to medial calcification. The right great toe pressure and PPG waveforms are within normal limits and the left great toe pressure and PPG waveforms are decreased. he recommended she continue to wear her compression stockings and continue with elevation. She is scheduled to have a noninvasive arterial study in the near future 08/16/2017 -- had a lower extremity arterial duplex examination done which showed patent distal right superficial femoral artery stent and above-the-knee popliteal stent without evidence of restenosis. The ABI was more than 1.3 on the right and more than 1.3 on the left. This was consistent with noncompressible arteries due to medial calcification. The right great toe pressure and PPG waveforms are within normal limits and the left great toe pressure and PPG  waveforms are decreased. the x-ray of the right ankle has not yet been done 08/24/2017 -- had a right ankle x-ray -- IMPRESSION:1. No fracture, bone lesion or evidence of osteomyelitis. 2. Lateral soft tissue swelling with a soft tissue ulcer. she has not yet seen the vascular surgeon for review 08/31/17 on evaluation today patient's wound appears to be showing signs of improvement. She still with her appointment with vascular in order to review her results of her vascular study and then determine if any intervention would be recommended at that  time. No fevers, chills, nausea, or vomiting noted at this time. She has been tolerating the dressing changes without complication. 09/28/17 on evaluation today patient's wound appears to show signs of good improvement in regard to the granulation tissue which is surfacing. There is still a layer of slough covering the wound and the posterior portion is still significantly deeper than the anterior nonetheless there has been some good sign of things moving towards the better. She is going to go back to Dr. dew for reevaluation to ensure her blood flow is still appropriate. That will be before her next evaluation with [place] next week. No fevers, chills, nausea, or vomiting noted at this time. Patient does have some discomfort rated to be a 3-4/10 depending on activity specifically cleansing the wound makes it worse. 10/05/2017 -- the patient was seen by Dr. Lucky Cowboy last week and noninvasive studies showed a normal right ABI with brisk [name], [name]. ([id number]) triphasic waveforms consistent with no arterial insufficiency including normal digital pressures. The duplex showed a patent distal right SFA stent and the proximal SFA was also normal. He was pleased with her test and thought she should have enough of perfusion for normal wound healing. He would see her back in 6 months time. 12/21/17 on evaluation today patient appears to be doing fairly well in regard  to her right lateral ankle wound. Unfortunately the main issue that she is expansion at this point is that she is having some issues with what appears to be some cellulitis in the right anterior shin. She has also been noting a little bit of uncomfortable feeling especially last night and her ankle area. I'm afraid that she made the developing a little bit of an infection. With that being said I think it is in the early stages. 12/28/17 on evaluation today patient's ankle appears to be doing excellent. She's making good progress at this point the cellulitis seems to have improved after last week's evaluation. Overall she is having no significant discomfort which is excellent news. She does have an appointment with Dr. dew on March 29, 2018 for reevaluation in regard to the stent he placed. She seems to have excellent blood flow in the right lower extremity. 01/19/12 on evaluation today patient's wound appears to be doing very well. In fact she does not appear to require debridement at this point, there's no evidence of infection, and overall from the standpoint of the wound she seems to be doing very well. With that being said I believe that it may be time to switch to different dressing away from the [facility] Dressing she tells me she does have a lot going on her friend actually passed away yesterday and she's also having a lot of issues with her husband this obviously is weighing heavy on her as far as your thoughts and concerns today. 01/25/18 on evaluation today patient appears to be doing fairly well in regard to her right lateral malleolus. She has been tolerating the dressing changes without complication. Overall I feel like this is definitely showing signs of improvement as far as how the overall appearance of the wound is there's also evidence of epithelium start to migrate over the granulation tissue. In general I think that she is progressing nicely as far as the wound is concerned. The  only concern she really has is whether or not we can switch to every other week visits in order to avoid having as many appointments as her daughters have a difficult time getting  her to her appointments as well as the patient's husband to his he is not doing very well at this point. 02/22/18 on evaluation today patient's right lateral malleolus ulcer appears to be doing great. She has been tolerating the dressing changes without complication. Overall you making excellent progress at this time. Patient is having no significant discomfort. Patient History Information obtained from Patient. Family History Cancer - Father,Siblings, Heart Disease - Siblings, No family history of Diabetes, Hereditary Spherocytosis, Hypertension, Kidney Disease, Lung Disease, Seizures, Stroke, Thyroid Problems, Tuberculosis. Social History Never smoker, Marital Status - Married, Alcohol Use - Never, Drug Use - No History, Caffeine Use - Rarely. Review of Systems (ROS) Constitutional Symptoms (General Health) Denies complaints or symptoms of Fever, Chills. Respiratory The patient has no complaints or symptoms. Cardiovascular The patient has no complaints or symptoms. Psychiatric The patient has no complaints or symptoms. [name], [name] ([id number]) Objective Constitutional Well-nourished and well-hydrated in no acute distress. Vitals Time Taken: 12:39 PM, Height: 65 in, Weight: 154.3 lbs, BMI: 25.7, Temperature: 97.6 F, Pulse: 93 bpm, Respiratory Rate: 16 breaths/min, Blood Pressure: 135/70 mmHg. Respiratory normal breathing without difficulty. clear to auscultation bilaterally. Cardiovascular regular rate and rhythm with normal S1, S2. Psychiatric this patient is able to make decisions and demonstrates good insight into disease process. Alert and Oriented x 3. pleasant and cooperative. General Notes: Currently after cleansing with saline and gauze mechanically to breeding the wound the area appears  to be excellent but no sharp debridement was needed. Overall I'm very pleased with how things seem to have progressed at this point. He continues to take care of the wound very well. Integumentary (Hair, Skin) Wound #1 status is Open. Original cause of wound was Gradually Appeared. The wound is located on the Right,Lateral Malleolus. The wound measures 0.7cm length x 0.8cm width x 0.1cm depth; 0.44cm^2 area and 0.044cm^3 volume. There is Fat Layer (Subcutaneous Tissue) Exposed exposed. There is no tunneling or undermining noted. There is a large amount of serous drainage noted. The wound margin is distinct with the outline attached to the wound base. There is small (1-33%) red granulation within the wound bed. There is a large (67-100%) amount of necrotic tissue within the wound bed including Eschar and Adherent Slough. The periwound skin appearance exhibited: Excoriation, Rubor. The periwound skin appearance did not exhibit: Callus, Crepitus, Induration, Rash, Scarring, Dry/Scaly, Maceration, Atrophie Blanche, Cyanosis, Ecchymosis, Hemosiderin Staining, Mottled, Pallor, Erythema. Periwound temperature was noted as No Abnormality. The periwound has tenderness on palpation. Assessment Active Problems ICD-10 L97.312 - 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d I70.233 - Atherosclerosis of native arteries of right leg with ulceration of ankle I89.0 - Lymphedema, not elsewhere classified B35.4 - Tinea corporis Plan Wound Cleansing: Wound #1 Right,Lateral Malleolus: [name], [name] ([id number]) Clean wound with Normal Saline. Cleanse wound with mild soap and water May Shower, gently pat wound dry prior to applying new dressing. Anesthetic (add to Medication List): Wound #1 Right,Lateral Malleolus: Topical Lidocaine 4% cream applied to wound bed prior to debridement (In Clinic Only). Primary Wound Dressing: Wound #1 Right,Lateral Malleolus: Hydrafera Blue Ready  Transfer Secondary Dressing: Wound #1 Right,Lateral Malleolus: Dry Gauze Conform/Kerlix Other - stretch net Dressing Change Frequency: Wound #1 Right,Lateral Malleolus: Change dressing every other day. Follow-up Appointments: Wound #1 Right,Lateral Malleolus: Return Appointment in 3 weeks. Edema Control: Wound #1 Right,Lateral Malleolus: Elevate legs to the level of the heart and pump ankles as often as possible Off-Loading: Wound #1 Right,Lateral Malleolus: Turn  and reposition every 2 hours Other: - do not put pressure on your right ankle Additional Orders / Instructions: Wound #1 Right,Lateral Malleolus: Increase protein intake. Other: - Add Vitamin C, Zinc, Vitamin A to your diet I am going to recommend at this time that we continue with the Current wound care measures for the next week. She is in agreement with the plan. We will in fact see her for reevaluation in three weeks time since she will not be able to be here in two weeks due to her daughter who is her transportation not being able to drive her secondary to having a small surgery of her own. I think it will be fine based on the appearance of this wound to just see her for three week follow-up however. They are in agreement with this plan. Please see above for specific wound care orders for the interim. Electronic Signature(s) Signed: 02/24/2018 11:36:05 PM By: Worthy Keeler PA-C Entered By: Worthy Keeler on 02/22/2018 13:10:22 [name], [name] ([id number]) -------------------------------------------------------------------------------- ROS/PFSH Details Patient Name: [name] Date of Service: 02/22/2018 12:30 PM Medical Record Number: [id number] Patient Account Number: [id number] Date of Birth/Sex: 1923/09/15 (84 y.o. F) Treating RN: Montey Hora Primary Care Provider: FITZGERALD, DAVID Other Clinician: Referring Provider: FITZGERALD, DAVID Treating Provider/Extender: STONE III, HOYT Weeks in  Treatment: 28 Information Obtained From Patient Wound History Do you currently have one or more open woundso Yes How many open wounds do you currently haveo 1 Approximately how long have you had your woundso 2 yrs How have you been treating your wound(s) until nowo mupirocin, soaking in epsom salt Has your wound(s) ever healed and then re-openedo No Have you had any lab work done in the past montho No Have you tested positive for an antibiotic resistant organism (MRSA, VRE)o No Have you tested positive for osteomyelitis (bone infection)o No Have you had any tests for circulation on your legso Yes Who ordered the testo Dr. Lucky Cowboy [facility] test [facility] Constitutional Symptoms (General Health) Complaints and Symptoms: Negative for: Fever; Chills Eyes Medical History: Positive for: Cataracts - surgery Respiratory Complaints and Symptoms: No Complaints or Symptoms Cardiovascular Complaints and Symptoms: No Complaints or Symptoms Medical History: Positive for: Congestive Heart Failure; Hypertension Musculoskeletal Medical History: Positive for: Osteoarthritis Neurologic Medical History: Positive for: Neuropathy Oncologic [name], [name] ([id number]) Medical History: Negative for: Received Chemotherapy; Received Radiation Psychiatric Complaints and Symptoms: No Complaints or Symptoms HBO Extended History Items Eyes: Cataracts Immunizations Pneumococcal Vaccine: Received Pneumococcal Vaccination: Yes Implantable Devices Family and Social History Cancer: Yes - Father,Siblings; Diabetes: No; Heart Disease: Yes - Siblings; Hereditary Spherocytosis: No; Hypertension: No; Kidney Disease: No; Lung Disease: No; Seizures: No; Stroke: No; Thyroid Problems: No; Tuberculosis: No; Never smoker; Marital Status - Married; Alcohol Use: Never; Drug Use: No History; Caffeine Use: Rarely; Financial Concerns: No; Food, Clothing or Shelter Needs: No; Support System Lacking: No;  Transportation Concerns: No; Advanced Directives: No; Patient does not want information on Advanced Directives; Do not resuscitate: No; Living Will: Yes (Not Provided); Medical Power of Attorney: No Physician Affirmation I have reviewed and agree with the above information. Electronic Signature(s) Signed: 02/22/2018 4:19:12 PM By: Montey Hora Signed: 02/24/2018 11:36:05 PM By: Worthy Keeler PA-C Entered By: Worthy Keeler on 02/22/2018 13:09:07 [name], [name] ([id number]) -------------------------------------------------------------------------------- SuperBill Details Patient Name: [name] Date of Service: 02/22/2018 Medical Record Number: [id number] Patient Account Number: [id number] Date of Birth/Sex: 1923-08-05 (84 y.o. 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 I70.233 Atherosclerosis of native arteries of right leg with ulceration of ankle I89.0 Lymphedema, not elsewhere classified B35.4 Tinea corporis Facility Procedures CPT4 Code: 42103128 Description: 11886 - WOUND CARE VISIT-LEV 3 EST PT Modifier: Quantity: 1 Physician Procedures CPT4 Code: 7737366 Description: 81594 - WC PHYS LEVEL 3 - EST PT ICD-10 Diagnosis Description L07.615 Non-pressure chronic ulcer of right ankle with fat layer expos I70.233 Atherosclerosis of native arteries of right leg with ulceratio I89.0 Lymphedema, not elsewhere  classified B35.4 Tinea corporis Modifier: ed n of ankle Quantity: 1 Electronic Signature(s) Signed: 02/24/2018 11:36:05 PM By: Worthy Keeler PA-C Entered By: Worthy Keeler on 02/22/2018 13:10:38

## 2018-03-15 ENCOUNTER — Encounter: Payer: Medicare Other | Attending: Physician Assistant | Admitting: Physician Assistant

## 2018-03-15 DIAGNOSIS — I739 Peripheral vascular disease, unspecified: Secondary | ICD-10-CM | POA: Diagnosis not present

## 2018-03-15 DIAGNOSIS — I890 Lymphedema, not elsewhere classified: Secondary | ICD-10-CM | POA: Insufficient documentation

## 2018-03-15 DIAGNOSIS — Z85828 Personal history of other malignant neoplasm of skin: Secondary | ICD-10-CM | POA: Insufficient documentation

## 2018-03-15 DIAGNOSIS — B354 Tinea corporis: Secondary | ICD-10-CM | POA: Insufficient documentation

## 2018-03-15 DIAGNOSIS — I110 Hypertensive heart disease with heart failure: Secondary | ICD-10-CM | POA: Diagnosis not present

## 2018-03-15 DIAGNOSIS — I509 Heart failure, unspecified: Secondary | ICD-10-CM | POA: Diagnosis not present

## 2018-03-15 DIAGNOSIS — G629 Polyneuropathy, unspecified: Secondary | ICD-10-CM | POA: Insufficient documentation

## 2018-03-15 DIAGNOSIS — Z9049 Acquired absence of other specified parts of digestive tract: Secondary | ICD-10-CM | POA: Diagnosis not present

## 2018-03-15 DIAGNOSIS — Z8249 Family history of ischemic heart disease and other diseases of the circulatory system: Secondary | ICD-10-CM | POA: Insufficient documentation

## 2018-03-15 DIAGNOSIS — Z950 Presence of cardiac pacemaker: Secondary | ICD-10-CM | POA: Insufficient documentation

## 2018-03-15 DIAGNOSIS — L97312 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d: Secondary | ICD-10-CM | POA: Insufficient documentation

## 2018-03-15 DIAGNOSIS — M1990 Unspecified osteoarthritis, unspecified site: Secondary | ICD-10-CM | POA: Insufficient documentation

## 2018-03-19 NOTE — Progress Notes (Signed)
[name] ([id number]) Visit Report for 03/15/2018 Arrival Information Details Patient Name: [name], [name] Date of Service: 03/15/2018 12:30 PM Medical Record Number: [id number] Patient Account Number: [id number] Date of Birth/Sex: [birth year] (84 y.o. F) Treating RN: Roger Shelter Primary Care Keerthi Hazell: FITZGERALD, DAVID Other Clinician: Referring Earlisha Sharples: FITZGERALD, DAVID Treating Eswin Worrell/Extender: STONE III, HOYT Weeks in Treatment: 40 Visit Information History Since Last Visit All ordered tests and consults were completed: No Patient Arrived: Ambulatory Added or deleted any medications: No Arrival Time: 12:38 Any new allergies or adverse reactions: No Accompanied By: daughter Had a fall or experienced change in No Transfer Assistance: None activities of daily living that may affect Patient Identification Verified: Yes risk of falls: Secondary Verification Process Completed: Yes Signs or symptoms of abuse/neglect since last visito No Patient Requires Transmission-Based No Hospitalized since last visit: No Precautions: Implantable device outside of the clinic excluding No Patient Has Alerts: No cellular tissue based products placed in the center since last visit: Pain Present Now: No Electronic Signature(s) Signed: 03/15/2018 4:04:12 PM By: Roger Shelter Entered By: Roger Shelter on 03/15/2018 12:40:38 Maxson, [name] ([id number]) -------------------------------------------------------------------------------- Encounter Discharge Information Details Patient Name: [name] Date of Service: 03/15/2018 12:30 PM Medical Record Number: [id number] Patient Account Number: [id number] Date of Birth/Sex: [birth year] (84 y.o. F) Treating RN: Montey Hora Primary Care Norrin Shreffler: FITZGERALD, DAVID Other Clinician: Referring Keean Wilmeth: FITZGERALD, DAVID Treating Patrycja Mumpower/Extender: STONE III, HOYT Weeks in Treatment: 31 Encounter Discharge  Information Items Schedule Follow-up Appointment: No Medication Reconciliation completed and No provided to Patient/Care Timothy Trudell: Provided on Clinical Summary of Care: 03/15/2018 Form Type Recipient Paper Patient [place] Electronic Signature(s) Signed: 03/15/2018 4:32:37 PM By: Ruthine Dose Entered By: Ruthine Dose on 03/15/2018 13:15:37 Croy, [name] ([id number]) -------------------------------------------------------------------------------- Lower Extremity Assessment Details Patient Name: [name] Date of Service: 03/15/2018 12:30 PM Medical Record Number: [id number] Patient Account Number: [id number] Date of Birth/Sex: 1923-03-22 (84 y.o. F) Treating RN: Roger Shelter Primary Care Ciarra Braddy: FITZGERALD, DAVID Other Clinician: Referring Zayveon Raschke: FITZGERALD, DAVID Treating Siri Buege/Extender: STONE III, HOYT Weeks in Treatment: 31 Edema Assessment Assessed: [Left: No] [Right: No] Edema: [Left: Ye] [Right: s] Vascular Assessment Claudication: Claudication Assessment [Right:None] Pulses: Dorsalis Pedis Palpable: [Right:Yes] Posterior Tibial Extremity colors, hair growth, and conditions: Extremity Color: [Right:Normal] Hair Growth on Extremity: [Right:No] Temperature of Extremity: [Right:Warm] Capillary Refill: [Right:< 3 seconds] Toe Nail Assessment Left: Right: Thick: No Discolored: No Deformed: No Improper Length and Hygiene: No Electronic Signature(s) Signed: 03/15/2018 4:04:12 PM By: Roger Shelter Entered By: Roger Shelter on 03/15/2018 12:49:43 Vonruden, [name] ([id number]) -------------------------------------------------------------------------------- Multi Wound Chart Details Patient Name: [name] Date of Service: 03/15/2018 12:30 PM Medical Record Number: [id number] Patient Account Number: [id number] Date of Birth/Sex: [birth year] (84 y.o. F) Treating RN: Montey Hora Primary Care Cailyn Houdek: FITZGERALD, DAVID Other  Clinician: Referring William Schake: FITZGERALD, DAVID Treating Calen Posch/Extender: STONE III, HOYT Weeks in Treatment: 31 Vital Signs Height(in): 65 Pulse(bpm): 87 Weight(lbs): 154.3 Blood Pressure(mmHg): 129/60 Body Mass Index(BMI): 26 Temperature(F): 97.8 Respiratory Rate 16 (breaths/min): Photos: [1:No Photos] [N/A:N/A] Wound Location: [1:Right Malleolus - Lateral] [N/A:N/A] Wounding Event: [1:Gradually Appeared] [N/A:N/A] Primary Etiology: [1:Lymphedema] [N/A:N/A] Secondary Etiology: [1:Arterial Insufficiency Ulcer] [N/A:N/A] Comorbid History: [1:Cataracts, Congestive Heart Failure, Hypertension, Osteoarthritis, Neuropathy] [N/A:N/A] Date Acquired: [1:08/11/2015] [N/A:N/A] Weeks of Treatment: [1:31] [N/A:N/A] Wound Status: [1:Open] [N/A:N/A] Measurements L x W x D [1:1x1.2x0.1] [N/A:N/A] (cm) Area (cm) : [1:0.942] [N/A:N/A] Volume (cm) : [1:0.094] [N/A:N/A] % Reduction in Area: [1:38.50%] [N/A:N/A] % Reduction in Volume: [1:79.50%] [N/A:N/A] Classification: [1:Full Thickness Without Exposed  Support Structures] [N/A:N/A] Exudate Amount: [1:Large] [N/A:N/A] Exudate Type: [1:Serous] [N/A:N/A] Exudate Color: [1:amber] [N/A:N/A] Wound Margin: [1:Distinct, outline attached] [N/A:N/A] Granulation Amount: [1:Small (1-33%)] [N/A:N/A] Granulation Quality: [1:Red] [N/A:N/A] Necrotic Amount: [1:Large (67-100%)] [N/A:N/A] Necrotic Tissue: [1:Eschar, Adherent Slough] [N/A:N/A] Exposed Structures: [1:Fat Layer (Subcutaneous Tissue) Exposed: Yes] [N/A:N/A] Epithelialization: [1:Small (1-33%)] [N/A:N/A] Periwound Skin Texture: [1:Excoriation: Yes Induration: No Callus: No Crepitus: No Rash: No Scarring: No] [N/A:N/A] Periwound Skin Moisture: [N/A:N/A] Maceration: No Dry/Scaly: No Periwound Skin Color: Rubor: Yes N/A N/A Atrophie Blanche: No Cyanosis: No Ecchymosis: No Erythema: No Hemosiderin Staining: No Mottled: No Pallor: No Temperature: No Abnormality N/A N/A Tenderness  on Palpation: Yes N/A N/A Wound Preparation: Ulcer Cleansing: N/A N/A Rinsed/Irrigated with Saline Topical Anesthetic Applied: Other: lidocaine 4% Treatment Notes Electronic Signature(s) Signed: 03/15/2018 4:45:51 PM By: Montey Hora Entered By: Montey Hora on 03/15/2018 12:59:55 Griffin, [name] ([id number]) -------------------------------------------------------------------------------- [place] Details Patient Name: [name] Date of Service: 03/15/2018 12:30 PM Medical Record Number: [id number] Patient Account Number: [id number] Date of Birth/Sex: Jul 31, 1923 (84 y.o. F) Treating RN: Montey Hora Primary Care Azuree Minish: FITZGERALD, DAVID Other Clinician: Referring Alvaretta Eisenberger: FITZGERALD, DAVID Treating Sharlynn Seckinger/Extender: STONE III, HOYT Weeks in Treatment: 31 Active Inactive ` Abuse / Safety / Falls / Self Care Management Nursing Diagnoses: Potential for falls Goals: Patient will not experience any injury related to falls Date Initiated: 08/10/2017 Target Resolution Date: 11/10/2017 Goal Status: Active Interventions: Assess Activities of Daily Living upon admission and as needed Assess fall risk on admission and as needed Assess: immobility, friction, shearing, incontinence upon admission and as needed Notes: ` Nutrition Nursing Diagnoses: Imbalanced nutrition Potential for alteratiion in Nutrition/Potential for imbalanced nutrition Goals: Patient/caregiver agrees to and verbalizes understanding of need to use nutritional supplements and/or vitamins as prescribed Date Initiated: 08/10/2017 Target Resolution Date: 12/08/2017 Goal Status: Active Interventions: Assess patient nutrition upon admission and as needed per policy Notes: ` Orientation to the Wound Care Program Nursing Diagnoses: Knowledge deficit related to the wound healing center program Goals: Patient/caregiver will verbalize understanding of the [place]  Program Date Initiated: 08/10/2017 Target Resolution Date: 09/08/2017 [name], [name] ([id number]) Goal Status: Active Interventions: Provide education on orientation to the wound center Notes: ` Pain, Acute or Chronic Nursing Diagnoses: Pain, acute or chronic: actual or potential Potential alteration in comfort, pain Goals: Patient/caregiver will verbalize adequate pain control between visits Date Initiated: 08/10/2017 Target Resolution Date: 12/08/2017 Goal Status: Active Interventions: Complete pain assessment as per visit requirements Notes: ` Wound/Skin Impairment Nursing Diagnoses: Impaired tissue integrity Knowledge deficit related to ulceration/compromised skin integrity Goals: Ulcer/skin breakdown will have a volume reduction of 80% by week 12 Date Initiated: 08/10/2017 Target Resolution Date: 12/01/2017 Goal Status: Active Interventions: Assess patient/caregiver ability to perform ulcer/skin care regimen upon admission and as needed Notes: Electronic Signature(s) Signed: 03/15/2018 4:45:51 PM By: Montey Hora Entered By: Montey Hora on 03/15/2018 12:58:17 Quiles, [name] ([id number]) -------------------------------------------------------------------------------- Pain Assessment Details Patient Name: [name] Date of Service: 03/15/2018 12:30 PM Medical Record Number: [id number] Patient Account Number: [id number] Date of Birth/Sex: 1923-03-18 (84 y.o. F) Treating RN: Roger Shelter Primary Care Carver Murakami: FITZGERALD, DAVID Other Clinician: Referring Kevonte Vanecek: FITZGERALD, DAVID Treating Rashed Edler/Extender: STONE III, HOYT Weeks in Treatment: 31 Active Problems Location of Pain Severity and Description of Pain Patient Has Paino No Site Locations Pain Management and Medication Current Pain Management: Electronic Signature(s) Signed: 03/15/2018 4:04:12 PM By: Roger Shelter Entered By: Roger Shelter on 03/15/2018 12:41:19 Sheils, [name] ([id number]) -------------------------------------------------------------------------------- Wound Assessment  Details Patient Name: CARA, THAXTON. Date of Service: 03/15/2018 12:30 PM Medical Record Number: [id number] Patient Account Number: [id number] Date of Birth/Sex: 14-Jun-1923 (84 y.o. F) Treating RN: Roger Shelter Primary Care Alaira Level: FITZGERALD, DAVID Other Clinician: Referring Parminder Cupples: FITZGERALD, DAVID Treating Eagan Shifflett/Extender: STONE III, HOYT Weeks in Treatment: 31 Wound Status Wound Number: 1 Primary Lymphedema Etiology: Wound Location: Right Malleolus - Lateral Secondary Arterial Insufficiency Ulcer Wounding Event: Gradually Appeared Etiology: Date Acquired: 08/11/2015 Wound Status: Open Weeks Of Treatment: 31 Comorbid Cataracts, Congestive Heart Failure, Clustered Wound: No History: Hypertension, Osteoarthritis, Neuropathy Photos Photo Uploaded By: Roger Shelter on 03/15/2018 16:22:45 Wound Measurements Length: (cm) 1 Width: (cm) 1.2 Depth: (cm) 0.1 Area: (cm) 0.942 Volume: (cm) 0.094 % Reduction in Area: 38.5% % Reduction in Volume: 79.5% Epithelialization: Small (1-33%) Tunneling: No Undermining: No Wound Description Full Thickness Without Exposed Support Foul Classification: Structures Slou Wound Margin: Distinct, outline attached Exudate Large Amount: Exudate Type: Serous Exudate Color: amber Odor After Cleansing: No gh/Fibrino Yes Wound Bed Granulation Amount: Small (1-33%) Exposed Structure Granulation Quality: Red Fat Layer (Subcutaneous Tissue) Exposed: Yes Necrotic Amount: Large (67-100%) Necrotic Quality: Eschar, Adherent Slough Periwound Skin Texture Texture Color Muro, [name] ([id number]) No Abnormalities Noted: No No Abnormalities Noted: No Callus: No Atrophie Blanche: No Crepitus: No Cyanosis: No Excoriation: Yes Ecchymosis: No Induration: No Erythema: No Rash: No Hemosiderin Staining:  No Scarring: No Mottled: No Pallor: No Moisture Rubor: Yes No Abnormalities Noted: No Dry / Scaly: No Temperature / Pain Maceration: No Temperature: No Abnormality Tenderness on Palpation: Yes Wound Preparation Ulcer Cleansing: Rinsed/Irrigated with Saline Topical Anesthetic Applied: Other: lidocaine 4%, Electronic Signature(s) Signed: 03/15/2018 4:04:12 PM By: Roger Shelter Entered By: Roger Shelter on 03/15/2018 12:48:38 Sinning, [name] ([id number]) -------------------------------------------------------------------------------- Vitals Details Patient Name: [name] Date of Service: 03/15/2018 12:30 PM Medical Record Number: [id number] Patient Account Number: [id number] Date of Birth/Sex: [birth year] (84 y.o. F) Treating RN: Roger Shelter Primary Care Jaquavious Mercer: FITZGERALD, DAVID Other Clinician: Referring Greysyn Vanderberg: FITZGERALD, DAVID Treating Pat Elicker/Extender: STONE III, HOYT Weeks in Treatment: 31 Vital Signs Time Taken: 12:41 Temperature (F): 97.8 Height (in): 65 Pulse (bpm): 87 Weight (lbs): 154.3 Respiratory Rate (breaths/min): 16 Body Mass Index (BMI): 25.7 Blood Pressure (mmHg): 129/60 Reference Range: 80 - 120 mg / dl Electronic Signature(s) Signed: 03/15/2018 4:04:12 PM By: Roger Shelter Entered By: Roger Shelter on 03/15/2018 12:41:38

## 2018-03-22 ENCOUNTER — Encounter: Payer: Medicare Other | Admitting: Physician Assistant

## 2018-03-22 DIAGNOSIS — L97312 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d: Secondary | ICD-10-CM | POA: Diagnosis not present

## 2018-03-22 NOTE — Progress Notes (Signed)
ANEL, CREIGHTON ([id number]) Visit Report for 03/15/2018 Chief Complaint Document Details Patient Name: [name], [name]. Date of Service: 03/15/2018 12:30 PM Medical Record Number: [id number] Patient Account Number: [id number] Date of Birth/Sex: 1923/04/24 (84 y.o. F) Treating RN: Montey Hora Primary Care Provider: FITZGERALD, DAVID Other Clinician: Referring Provider: FITZGERALD, DAVID Treating Provider/Extender: STONE III, Anam Bobby Weeks in Treatment: 31 Information Obtained from: Patient Chief Complaint Patient presents to the wound care center for a consult due non healing wound to the right lateral malleolus Electronic Signature(s) Signed: 03/17/2018 12:59:06 AM By: Worthy Keeler PA-C Entered By: Worthy Keeler on 03/15/2018 12:55:40 [name], [name] ([id number]) -------------------------------------------------------------------------------- Debridement Details Patient Name: [name] Date of Service: 03/15/2018 12:30 PM Medical Record Number: [id number] Patient Account Number: [id number] Date of Birth/Sex: May 25, 1923 (84 y.o. F) Treating RN: Montey Hora Primary Care Provider: FITZGERALD, DAVID Other Clinician: Referring Provider: FITZGERALD, DAVID Treating Provider/Extender: STONE III, Smaran Gaus Weeks in Treatment: 31 Debridement Performed for Wound #1 Right,Lateral Malleolus Assessment: Performed By: Physician STONE III, Alicia Seib E., PA-C Debridement Type: Debridement Severity of Tissue Pre Fat layer exposed Debridement: Pre-procedure Verification/Time Yes - 13:00 Out Taken: Start Time: 13:00 Pain Control: Lidocaine 4% Topical Solution Total Area Debrided (L x W): 1 (cm) x 1.2 (cm) = 1.2 (cm) Tissue and other material Viable, Non-Viable, Slough, Subcutaneous, Skin: Dermis , Skin: Epidermis, Slough debrided: Level: Skin/Subcutaneous Tissue Debridement Description: Excisional Instrument: Curette Bleeding: Minimum Hemostasis Achieved: Pressure End  Time: 13:06 Procedural Pain: 0 Post Procedural Pain: 0 Response to Treatment: Procedure was tolerated well Post Debridement Measurements of Total Wound Length: (cm) 2.2 Width: (cm) 1.9 Depth: (cm) 0.1 Volume: (cm) 0.328 Character of Wound/Ulcer Post Debridement: Improved Severity of Tissue Post Debridement: Fat layer exposed Post Procedure Diagnosis Same as Pre-procedure Electronic Signature(s) Signed: 03/15/2018 4:45:51 PM By: Montey Hora Signed: 03/17/2018 12:59:06 AM By: Worthy Keeler PA-C Entered By: Montey Hora on 03/15/2018 13:06:55 [name], [name] ([id number]) -------------------------------------------------------------------------------- HPI Details Patient Name: [name] Date of Service: 03/15/2018 12:30 PM Medical Record Number: [id number] Patient Account Number: [id number] Date of Birth/Sex: [birth year] (84 y.o. 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 Dr. Amalia Hailey the podiatrist saw her on 07/20/2017 and sent her to the wound center for possible hyperbaric oxygen therapy. past medical history of peripheral vascular disease, varicose veins, status post appendectomy, basal cell carcinoma excision from the left leg, cholecystectomy, pacemaker placement, right lower extremity angiography done by Dr. dew in March 2017 with placement of a stent. there is also note of a successful ablation of the right small saphenous vein done which was reviewed by ultrasound on 10/24/2016. the patient had a right small saphenous vein ablation done on 10/20/2016. The patient has never  been a smoker. She has been seen by Dr. Corene Cornea dew the vascular surgeon who most recently saw her on 06/15/2017 for evaluation of ongoing problems with right leg swelling. She had a lower extremity arterial duplex examination done(02/13/17) which showed patent distal right superficial femoral artery stent and above-the-knee popliteal stent without evidence of restenosis. The ABI was more than 1.3 on the right and more than 1.3 on the left. This was consistent with noncompressible arteries due to  medial calcification. The right great toe pressure and PPG waveforms are within normal limits and the left great toe pressure and PPG waveforms are decreased. he recommended she continue to wear her compression stockings and continue with elevation. She is scheduled to have a noninvasive arterial study in the near future 08/16/2017 -- had a lower extremity arterial duplex examination done which showed patent distal right superficial femoral artery stent and above-the-knee popliteal stent without evidence of restenosis. The ABI was more than 1.3 on the right and more than 1.3 on the left. This was consistent with noncompressible arteries due to medial calcification. The right great toe pressure and PPG waveforms are within normal limits and the left great toe pressure and PPG waveforms are decreased. the x-ray of the right ankle has not yet been done 08/24/2017 -- had a right ankle x-ray -- IMPRESSION:1. No fracture, bone lesion or evidence of osteomyelitis. 2. Lateral soft tissue swelling with a soft tissue ulcer. she has not yet seen the vascular surgeon for review 08/31/17 on evaluation today patient's wound appears to be showing signs of improvement. She still with her appointment with vascular in order to review her results of her vascular study and then determine if any intervention would be recommended at that time. No fevers, chills, nausea, or vomiting noted at this time. She has been tolerating the  dressing changes without complication. 09/28/17 on evaluation today patient's wound appears to show signs of good improvement in regard to the granulation tissue which is surfacing. There is still a layer of slough covering the wound and the posterior portion is still significantly deeper than the anterior nonetheless there has been some good sign of things moving towards the better. She is going to go back to Dr. dew for reevaluation to ensure her blood flow is still appropriate. That will be before her next evaluation with [place] next week. No fevers, chills, nausea, or vomiting noted at this time. Patient does have some discomfort rated to be a 3-4/10 depending on activity specifically cleansing the wound makes it worse. 10/05/2017 -- the patient was seen by Dr. Lucky Cowboy last week and noninvasive studies showed a normal right ABI with brisk triphasic waveforms consistent with no arterial insufficiency including normal digital pressures. The duplex showed a patent distal right SFA stent and the proximal SFA was also normal. He was pleased with her test and thought she should have enough of perfusion for normal wound healing. He would see her back in 6 months time. 12/21/17 on evaluation today patient appears to be doing fairly well in regard to her right lateral ankle wound. Unfortunately the main issue that she is expansion at this point is that she is having some issues with what appears to be some cellulitis in the INEZE, SERRAO. ([id number]) right anterior shin. She has also been noting a little bit of uncomfortable feeling especially last night and her ankle area. I'm afraid that she made the developing a little bit of an infection. With that being said I think it is in the early stages. 12/28/17 on evaluation today patient's ankle appears to be doing excellent. She's making good progress at this point the cellulitis seems to have improved after last week's evaluation. Overall she is having no  significant discomfort which is excellent news. She does have an appointment with Dr. dew on March 29, 2018 for reevaluation in regard to the stent he placed. She seems to have excellent blood flow in the right lower extremity. 01/19/12 on evaluation  today patient's wound appears to be doing very well. In fact she does not appear to require debridement at this point, there's no evidence of infection, and overall from the standpoint of the wound she seems to be doing very well. With that being said I believe that it may be time to switch to different dressing away from the [name] Dressing she tells me she does have a lot going on her friend actually passed away yesterday and she's also having a lot of issues with her husband this obviously is weighing heavy on her as far as your thoughts and concerns today. 01/25/18 on evaluation today patient appears to be doing fairly well in regard to her right lateral malleolus. She has been tolerating the dressing changes without complication. Overall I feel like this is definitely showing signs of improvement as far as how the overall appearance of the wound is there's also evidence of epithelium start to migrate over the granulation tissue. In general I think that she is progressing nicely as far as the wound is concerned. The only concern she really has is whether or not we can switch to every other week visits in order to avoid having as many appointments as her daughters have a difficult time getting her to her appointments as well as the patient's husband to his he is not doing very well at this point. 02/22/18 on evaluation today patient's right lateral malleolus ulcer appears to be doing great. She has been tolerating the dressing changes without complication. Overall you making excellent progress at this time. Patient is having no significant discomfort. 03/15/18 on evaluation today patient appears to be doing much more poorly in regard to her right  lateral ankle ulcer at this point. Unfortunately since have last seen her her husband has passed just a few days ago is obviously weighed heavily on her her daughter also had surgery well she is with her today as usual. There does not appear to be any evidence of infection she does seem to have significant contusion/deep tissue injury to the right lateral malleolus which was not noted previous when I saw her last. It's hard to tell of exactly when this injury occurred although during the time she was spending the night in the hospital this may have been most likely. Electronic Signature(s) Signed: 03/17/2018 12:59:06 AM By: Worthy Keeler PA-C Entered By: Worthy Keeler on 03/17/2018 00:06:44 [name] ([id number]) -------------------------------------------------------------------------------- Physical Exam Details Patient Name: ALMEE, PELPHREY Date of Service: 03/15/2018 12:30 PM Medical Record Number: [id number] Patient Account Number: [id number] Date of Birth/Sex: May 24, 1923 (84 y.o. F) Treating RN: Montey Hora Primary Care Provider: FITZGERALD, DAVID Other Clinician: Referring Provider: FITZGERALD, DAVID Treating Provider/Extender: STONE III, Badr Piedra Weeks in Treatment: 33 Constitutional Well-nourished and well-hydrated in no acute distress. Respiratory normal breathing without difficulty. Psychiatric this patient is able to make decisions and demonstrates good insight into disease process. Alert and Oriented x 3. pleasant and cooperative. Notes Patient's wound did require sharp debridement today he tolerated without complication. Post debridement the wound bed did appear to be better she still has necrotic tissue which was quite dark noted in the central portion of the wound. With that being said post debridement the wound was much larger than pretty debridement though it does appear to be much better. I did explain to the patient that she needs to be very aware of  what may be causing issues with pressure to the area does this with the patient's daughter as  well. Obviously she's had a lot going on. Electronic Signature(s) Signed: 03/17/2018 12:59:06 AM By: Worthy Keeler PA-C Entered By: Worthy Keeler on 03/17/2018 00:09:52 [name] ([id number]) -------------------------------------------------------------------------------- Physician Orders Details Patient Name: [name] Date of Service: 03/15/2018 12:30 PM Medical Record Number: [id number] Patient Account Number: [id number] Date of Birth/Sex: 26-Jul-1923 (84 y.o. 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 o Cleanse wound with mild soap and water o May Shower, gently pat wound dry prior to applying new dressing. Anesthetic (add to Medication List) Wound #1 Right,Lateral Malleolus o Topical Lidocaine 4% cream applied to wound bed prior to debridement (In Clinic Only). Primary Wound Dressing Wound #1 Right,Lateral Malleolus o Santyl Ointment Secondary Dressing Wound #1 Right,Lateral Malleolus o Saline moistened gauze o Boardered Foam Dressing Dressing Change Frequency Wound #1 Right,Lateral Malleolus o Change dressing every day. Follow-up Appointments Wound #1 Right,Lateral Malleolus o Return Appointment in 1 week. Edema Control Wound #1 Right,Lateral Malleolus o Elevate legs to the level of the heart and pump ankles as often  as possible Off-Loading Wound #1 Right,Lateral Malleolus Menken, [name] ([id number]) o Turn and reposition every 2 hours o Other: - do not put pressure on your right ankle Additional Orders / Instructions Wound #1 Right,Lateral Malleolus o Increase protein intake. o Other: - Add Vitamin C, Zinc, Vitamin A to your diet Electronic Signature(s) Signed: 03/15/2018 4:45:51 PM By: Montey Hora Signed: 03/17/2018 12:59:06 AM By: Worthy Keeler PA-C Entered By: Montey Hora on 03/15/2018 13:15:00 [name], [name] ([id number]) -------------------------------------------------------------------------------- Problem List Details Patient Name: [name] Date of Service: 03/15/2018 12:30 PM Medical Record Number: [id number] Patient Account Number: [id number] Date of Birth/Sex: 27-Feb-1923 (84 y.o. F) Treating RN: Montey Hora Primary Care Provider: FITZGERALD, DAVID Other Clinician: Referring Provider: FITZGERALD, DAVID Treating Provider/Extender: STONE III, Damonta Cossey Weeks in Treatment: 31 Active Problems ICD-10 Impacting Encounter Code Description Active Date Wound Healing Diagnosis L97.312 Non-pressure chronic ulcer of right ankle with fat layer 08/10/2017 Yes exposed I70.233 Atherosclerosis of native arteries of right leg with ulceration of 08/10/2017 Yes ankle I89.0 Lymphedema, not elsewhere classified 08/10/2017 Yes B35.4 Tinea corporis 09/28/2017 Yes Inactive Problems Resolved Problems Electronic Signature(s) Signed: 03/17/2018 12:59:06 AM By: Worthy Keeler PA-C Entered By: Worthy Keeler on 03/15/2018 12:55:21 [name], [name] ([id number]) -------------------------------------------------------------------------------- Progress Note Details Patient Name: [name] Date of Service: 03/15/2018 12:30 PM Medical Record Number: [id number] Patient Account Number: [id number] Date of Birth/Sex: [birth year] (84 y.o. F) Treating RN: Montey Hora Primary  Care Provider: FITZGERALD, DAVID Other Clinician: Referring Provider: FITZGERALD, DAVID Treating Provider/Extender: STONE III, Audrea Bolte Weeks in Treatment: 31 Subjective Chief Complaint Information obtained from Patient Patient presents to the wound care center for a consult due non healing wound to the right lateral malleolus History of Present Illness (HPI) 84 year old patient who most recently has been seeing both podiatry and vascular surgery for a long-standing ulcer of her right lateral malleolus which has been treated with various methodologies. Dr. Amalia Hailey the podiatrist saw her on 07/20/2017 and sent her to the wound center for possible hyperbaric oxygen  therapy. past medical history of peripheral vascular disease, varicose veins, status post appendectomy, basal cell carcinoma excision from the left leg, cholecystectomy, pacemaker placement, right lower extremity angiography done by Dr. dew in March 2017 with placement of a stent. there is also note of a successful ablation of the right small saphenous vein done which was reviewed by ultrasound on 10/24/2016. the patient had a right small saphenous vein ablation done on 10/20/2016. The patient has never been a smoker. She has been seen by Dr. Corene Cornea dew the vascular surgeon who most recently saw her on 06/15/2017 for evaluation of ongoing problems with right leg swelling. She had a lower extremity arterial duplex examination done(02/13/17) which showed patent distal right superficial femoral artery stent and above-the-knee popliteal stent without evidence of restenosis. The ABI was more than 1.3 on the right and more than 1.3 on the left. This was consistent with noncompressible arteries due to medial calcification. The right great toe pressure and PPG waveforms are within normal limits and the left great toe pressure and PPG waveforms are decreased. he recommended she continue to wear her compression stockings and continue with elevation.  She is scheduled to have a noninvasive arterial study in the near future 08/16/2017 -- had a lower extremity arterial duplex examination done which showed patent distal right superficial femoral artery stent and above-the-knee popliteal stent without evidence of restenosis. The ABI was more than 1.3 on the right and more than 1.3 on the left. This was consistent with noncompressible arteries due to medial calcification. The right great toe pressure and PPG waveforms are within normal limits and the left great toe pressure and PPG waveforms are decreased. the x-ray of the right ankle has not yet been done 08/24/2017 -- had a right ankle x-ray -- IMPRESSION:1. No fracture, bone lesion or evidence of osteomyelitis. 2. Lateral soft tissue swelling with a soft tissue ulcer. she has not yet seen the vascular surgeon for review 08/31/17 on evaluation today patient's wound appears to be showing signs of improvement. She still with her appointment with vascular in order to review her results of her vascular study and then determine if any intervention would be recommended at that time. No fevers, chills, nausea, or vomiting noted at this time. She has been tolerating the dressing changes without complication. 09/28/17 on evaluation today patient's wound appears to show signs of good improvement in regard to the granulation tissue which is surfacing. There is still a layer of slough covering the wound and the posterior portion is still significantly deeper than the anterior nonetheless there has been some good sign of things moving towards the better. She is going to go back to Dr. dew for reevaluation to ensure her blood flow is still appropriate. That will be before her next evaluation with [place] next week. No fevers, chills, nausea, or vomiting noted at this time. Patient does have some discomfort rated to be a 3-4/10 depending on activity specifically cleansing the wound makes it worse. 10/05/2017 -- the  patient was seen by Dr. Lucky Cowboy last week and noninvasive studies showed a normal right ABI with brisk COSIMA, PRENTISS. ([id number]) triphasic waveforms consistent with no arterial insufficiency including normal digital pressures. The duplex showed a patent distal right SFA stent and the proximal SFA was also normal. He was pleased with her test and thought she should have enough of perfusion for normal wound healing. He would see her back in 6 months time. 12/21/17 on evaluation today patient appears to be doing  fairly well in regard to her right lateral ankle wound. Unfortunately the main issue that she is expansion at this point is that she is having some issues with what appears to be some cellulitis in the right anterior shin. She has also been noting a little bit of uncomfortable feeling especially last night and her ankle area. I'm afraid that she made the developing a little bit of an infection. With that being said I think it is in the early stages. 12/28/17 on evaluation today patient's ankle appears to be doing excellent. She's making good progress at this point the cellulitis seems to have improved after last week's evaluation. Overall she is having no significant discomfort which is excellent news. She does have an appointment with Dr. dew on March 29, 2018 for reevaluation in regard to the stent he placed. She seems to have excellent blood flow in the right lower extremity. 01/19/12 on evaluation today patient's wound appears to be doing very well. In fact she does not appear to require debridement at this point, there's no evidence of infection, and overall from the standpoint of the wound she seems to be doing very well. With that being said I believe that it may be time to switch to different dressing away from the [facility] Dressing she tells me she does have a lot going on her friend actually passed away yesterday and she's also having a lot of issues with her husband this  obviously is weighing heavy on her as far as your thoughts and concerns today. 01/25/18 on evaluation today patient appears to be doing fairly well in regard to her right lateral malleolus. She has been tolerating the dressing changes without complication. Overall I feel like this is definitely showing signs of improvement as far as how the overall appearance of the wound is there's also evidence of epithelium start to migrate over the granulation tissue. In general I think that she is progressing nicely as far as the wound is concerned. The only concern she really has is whether or not we can switch to every other week visits in order to avoid having as many appointments as her daughters have a difficult time getting her to her appointments as well as the patient's husband to his he is not doing very well at this point. 02/22/18 on evaluation today patient's right lateral malleolus ulcer appears to be doing great. She has been tolerating the dressing changes without complication. Overall you making excellent progress at this time. Patient is having no significant discomfort. 03/15/18 on evaluation today patient appears to be doing much more poorly in regard to her right lateral ankle ulcer at this point. Unfortunately since have last seen her her husband has passed just a few days ago is obviously weighed heavily on her her daughter also had surgery well she is with her today as usual. There does not appear to be any evidence of infection she does seem to have significant contusion/deep tissue injury to the right lateral malleolus which was not noted previous when I saw her last. It's hard to tell of exactly when this injury occurred although during the time she was spending the night in the hospital this may have been most likely. Patient History Information obtained from Patient. Family History Cancer - Father,Siblings, Heart Disease - Siblings, No family history of Diabetes, Hereditary  Spherocytosis, Hypertension, Kidney Disease, Lung Disease, Seizures, Stroke, Thyroid Problems, Tuberculosis. Social History Never smoker, Marital Status - Married, Alcohol Use - Never, Drug Use -  No History, Caffeine Use - Rarely. Review of Systems (ROS) Constitutional Symptoms (General Health) Denies complaints or symptoms of Fever, Chills. Respiratory The patient has no complaints or symptoms. Cardiovascular The patient has no complaints or symptoms. Psychiatric The patient has no complaints or symptoms. [name], [name] ([id number]) Objective Constitutional Well-nourished and well-hydrated in no acute distress. Vitals Time Taken: 12:41 PM, Height: 65 in, Weight: 154.3 lbs, BMI: 25.7, Temperature: 97.8 F, Pulse: 87 bpm, Respiratory Rate: 16 breaths/min, Blood Pressure: 129/60 mmHg. Respiratory normal breathing without difficulty. Psychiatric this patient is able to make decisions and demonstrates good insight into disease process. Alert and Oriented x 3. pleasant and cooperative. General Notes: Patient's wound did require sharp debridement today he tolerated without complication. Post debridement the wound bed did appear to be better she still has necrotic tissue which was quite dark noted in the central portion of the wound. With that being said post debridement the wound was much larger than pretty debridement though it does appear to be much better. I did explain to the patient that she needs to be very aware of what may be causing issues with pressure to the area does this with the patient's daughter as well. Obviously she's had a lot going on. Integumentary (Hair, Skin) Wound #1 status is Open. Original cause of wound was Gradually Appeared. The wound is located on the Right,Lateral Malleolus. The wound measures 1cm length x 1.2cm width x 0.1cm depth; 0.942cm^2 area and 0.094cm^3 volume. There is Fat Layer (Subcutaneous Tissue) Exposed exposed. There is no tunneling or  undermining noted. There is a large amount of serous drainage noted. The wound margin is distinct with the outline attached to the wound base. There is small (1-33%) red granulation within the wound bed. There is a large (67-100%) amount of necrotic tissue within the wound bed including Eschar and Adherent Slough. The periwound skin appearance exhibited: Excoriation, Rubor. The periwound skin appearance did not exhibit: Callus, Crepitus, Induration, Rash, Scarring, Dry/Scaly, Maceration, Atrophie Blanche, Cyanosis, Ecchymosis, Hemosiderin Staining, Mottled, Pallor, Erythema. Periwound temperature was noted as No Abnormality. The periwound has tenderness on palpation. 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 There was a Excisional Skin/Subcutaneous Tissue Debridement with a total area of 1.2 sq cm performed by STONE III, Mahmood Boehringer E., PA-C. With the following instrument(s): Curette. to remove Viable and Non-Viable tissue/material Material removed includes Subcutaneous Tissue, and Slough, Skin: Dermis, Skin: Epidermis, and Slough after achieving pain control using Lidocaine 4% Topical Solution. No specimens were taken. A time out was conducted at 13:00, prior to the start of the procedure. A Minimum amount of bleeding was controlled with Pressure. The procedure was tolerated well with a pain level of 0 throughout and a pain level of 0 following the procedure. Post Debridement Measurements: 2.2cm length x 1.9cm width x 0.1cm depth; 0.328cm^3 volume. Character of Wound/Ulcer Post  Debridement is improved. Severity of Tissue Post Debridement is: Fat layer exposed. Post procedure Diagnosis Wound #1: Same as Pre-Procedure Plan Wound Cleansing: Wound #1 Right,Lateral Malleolus: Clean wound with Normal Saline. Cleanse wound with mild soap and water May Shower, gently pat wound dry prior to applying new  dressing. Anesthetic (add to Medication List): Wound #1 Right,Lateral Malleolus: Topical Lidocaine 4% cream applied to wound bed prior to debridement (In Clinic Only). Primary Wound Dressing: Wound #1 Right,Lateral Malleolus: Santyl Ointment Secondary Dressing: Wound #1 Right,Lateral Malleolus: Saline moistened gauze Boardered Foam Dressing Dressing Change Frequency: Wound #1 Right,Lateral Malleolus: Change dressing every day. Follow-up Appointments: Wound #1 Right,Lateral Malleolus: Return Appointment in 1 week. Edema Control: Wound #1 Right,Lateral Malleolus: Elevate legs to the level of the heart and pump ankles as often as possible Off-Loading: Wound #1 Right,Lateral Malleolus: Turn and reposition every 2 hours Other: - do not put pressure on your right ankle Additional Orders / Instructions: Wound #1 Right,Lateral Malleolus: Increase protein intake. Other: - Add Vitamin C, Zinc, Vitamin A to your diet NYKIAH, [name] ([id number]) At this point I'm gonna suggest that we go ahead forward with the [facility] which the patient actually has at home to try and help clean up the wound for the next week. Hopefully she will not need it much longer than that. Patient is in agreement with this plan. Please see above for specific wound care orders. We will see patient for re-evaluation in 1 week(s) here in the clinic. If anything worsens or changes patient will contact our office for additional recommendations. Electronic Signature(s) Signed: 03/17/2018 12:59:06 AM By: Worthy Keeler PA-C Entered By: Worthy Keeler on 03/17/2018 00:11:01 [name]  ([id number]) -------------------------------------------------------------------------------- ROS/PFSH Details Patient Name: [name] Date of Service: 03/15/2018 12:30 PM Medical Record Number: [id number] Patient Account Number: [id number] Date of Birth/Sex: 10/19/1923 (84 y.o. F) Treating RN: Montey Hora Primary Care Provider: FITZGERALD, DAVID Other Clinician: Referring Provider: FITZGERALD, DAVID Treating Provider/Extender: STONE III, Kevin Mario Weeks in Treatment: 31 Information Obtained From Patient Wound History Do you currently have one or more open woundso Yes How many open wounds do you currently haveo 1 Approximately how long have you had your woundso 2 yrs How have you been treating your wound(s) until nowo mupirocin, soaking in epsom salt Has your wound(s) ever healed and then re-openedo No Have you had any lab work done in the past montho No Have you tested positive for an antibiotic resistant organism (MRSA, VRE)o No Have you tested positive for osteomyelitis (bone infection)o No Have you had any tests for circulation on your legso Yes Who ordered the testo Dr. Lucky Cowboy [facility] test [facility] Constitutional Symptoms (General Health) Complaints and Symptoms: Negative for: Fever; Chills Eyes Medical History: Positive for: Cataracts - surgery Respiratory Complaints and Symptoms: No Complaints or Symptoms Cardiovascular Complaints and Symptoms: No Complaints or Symptoms Medical History: Positive for: Congestive Heart Failure; Hypertension Musculoskeletal Medical History: Positive for: Osteoarthritis Neurologic Medical History: Positive for: Neuropathy Oncologic [name], [name] ([id number]) Medical History: Negative for: Received Chemotherapy; Received Radiation Psychiatric Complaints and Symptoms: No Complaints or Symptoms HBO Extended History Items Eyes: Cataracts Immunizations Pneumococcal Vaccine: Received Pneumococcal Vaccination:  Yes Implantable Devices Family and Social History Cancer: Yes - Father,Siblings; Diabetes: No; Heart Disease: Yes - Siblings; Hereditary Spherocytosis: No; Hypertension: No; Kidney Disease: No; Lung Disease: No; Seizures: No; Stroke: No; Thyroid Problems: No; Tuberculosis: No; Never smoker; Marital Status - Married; Alcohol Use: Never; Drug Use: No History; Caffeine Use: Rarely; Financial Concerns: No; Food, Clothing or Shelter Needs: No; Support System Lacking: No; Transportation Concerns: No; Advanced Directives: No; Patient does not want information on Advanced Directives; Do not resuscitate: No; Living Will: Yes (Not Provided); Medical Power of Attorney: No Physician Affirmation I have reviewed and  agree with the above information. Electronic Signature(s) Signed: 03/17/2018 12:59:06 AM By: Worthy Keeler PA-C Signed: 03/18/2018 4:05:33 PM By: Montey Hora Entered By: Worthy Keeler on 03/17/2018 00:07:13 [name], [name] ([id number]) -------------------------------------------------------------------------------- [place] Details Patient Name: [name] Date of Service: 03/15/2018 Medical Record Number: [id number] Patient Account Number: [id number] Date of Birth/Sex: 1923/09/25 (84 y.o. 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 I89.0 Lymphedema, not elsewhere classified B35.4 Tinea corporis Facility Procedures CPT4 Code: 17711657 Description: 90383 - DEB SUBQ TISSUE 20 SQ CM/< ICD-10 Diagnosis Description F38.329 Non-pressure chronic ulcer of right ankle with fat layer ex Modifier: posed Quantity: 1 Physician  Procedures CPT4 Code: 1916606 Description: 00459 - WC PHYS SUBQ TISS 20 SQ CM ICD-10 Diagnosis Description X77.414 Non-pressure chronic ulcer of right ankle with fat layer ex Modifier: posed Quantity: 1 Electronic Signature(s) Signed: 03/17/2018 12:59:06 AM By: Worthy Keeler PA-C Entered By: Worthy Keeler on 03/17/2018 00:11:10

## 2018-03-28 NOTE — Progress Notes (Signed)
[name], [name] ([id number]) Visit Report for 03/22/2018 Arrival Information Details Patient Name: [name], [name] Date of Service: 03/22/2018 1:45 PM Medical Record Number: [id number] Patient Account Number: [id number] Date of Birth/Sex: 1923/11/11 (84 y.o. F) Treating RN: Roger Shelter Primary Care Jadelyn Elks: FITZGERALD, DAVID Other Clinician: Referring Woodrow Dulski: FITZGERALD, DAVID Treating Kresha Abelson/Extender: STONE III, HOYT Weeks in Treatment: 17 Visit Information History Since Last Visit All ordered tests and consults were completed: No Patient Arrived: Ambulatory Added or deleted any medications: No Arrival Time: 13:44 Any new allergies or adverse reactions: No Accompanied By: daughter Had a fall or experienced change in No Transfer Assistance: None activities of daily living that may affect Patient Identification Verified: Yes risk of falls: Secondary Verification Process Completed: Yes Signs or symptoms of abuse/neglect since last visito No Patient Requires Transmission-Based No Hospitalized since last visit: No Precautions: Implantable device outside of the clinic excluding No Patient Has Alerts: No cellular tissue based products placed in the center since last visit: Pain Present Now: No Electronic Signature(s) Signed: 03/25/2018 4:40:07 PM By: Roger Shelter Entered By: Roger Shelter on 03/22/2018 13:44:41 Peer, [name] ([id number]) -------------------------------------------------------------------------------- Encounter Discharge Information Details Patient Name: [name] Date of Service: 03/22/2018 1:45 PM Medical Record Number: [id number] Patient Account Number: [id number] Date of Birth/Sex: [birth year] (84 y.o. F) Treating RN: Montey Hora Primary Care Damira Kem: FITZGERALD, DAVID Other Clinician: Referring Marice Guidone: FITZGERALD, DAVID Treating Christabell Loseke/Extender: STONE III, HOYT Weeks in Treatment: 59 Encounter Discharge Information  Items Discharge Pain Level: 0 Discharge Condition: Stable Ambulatory Status: Ambulatory Discharge Destination: Home Transportation: Private Auto Accompanied By: daughter Schedule Follow-up Appointment: Yes Medication Reconciliation completed and No provided to Patient/Care Job [place]: Provided on Clinical Summary of Care: 03/22/2018 Form Type Recipient Paper Patient WS Electronic Signature(s) Signed: 03/22/2018 2:26:54 PM By: Alric Quan Entered By: Alric Quan on 03/22/2018 14:26:54 Jeancharles, [name] ([id number]) -------------------------------------------------------------------------------- Lower Extremity Assessment Details Patient Name: [name] Date of Service: 03/22/2018 1:45 PM Medical Record Number: [id number] Patient Account Number: [id number] Date of Birth/Sex: 19-Sep-1923 (84 y.o. F) Treating RN: Roger Shelter Primary Care Casey Maxfield: FITZGERALD, DAVID Other Clinician: Referring Braxston Quinter: FITZGERALD, DAVID Treating Eriyanna Kofoed/Extender: STONE III, HOYT Weeks in Treatment: 32 Edema Assessment Assessed: [Left: No] [Right: No] Edema: [Left: Ye] [Right: s] Vascular Assessment Claudication: Claudication Assessment [Right:None] Pulses: Dorsalis Pedis Palpable: [Right:Yes] Posterior Tibial Extremity colors, hair growth, and conditions: Extremity Color: [Right:Normal] Hair Growth on Extremity: [Right:Yes] Temperature of Extremity: [Right:Warm] Capillary Refill: [Right:< 3 seconds] Toe Nail Assessment Left: Right: Thick: No Discolored: No Deformed: No Improper Length and Hygiene: No Electronic Signature(s) Signed: 03/25/2018 4:40:07 PM By: Roger Shelter Entered By: Roger Shelter on 03/22/2018 13:49:58 Runion, [name] ([id number]) -------------------------------------------------------------------------------- Multi Wound Chart Details Patient Name: [name] Date of Service: 03/22/2018 1:45 PM Medical Record Number:  [id number] Patient Account Number: [id number] Date of Birth/Sex: 12/22/1922 (84 y.o. F) Treating RN: Montey Hora Primary Care Alycea Segoviano: FITZGERALD, DAVID Other Clinician: Referring Marieelena Bartko: FITZGERALD, DAVID Treating Elexius Minar/Extender: STONE III, HOYT Weeks in Treatment: 32 Vital Signs Height(in): 65 Pulse(bpm): 91 Weight(lbs): 154.3 Blood Pressure(mmHg): 123/67 Body Mass Index(BMI): 26 Temperature(F): 98.5 Respiratory Rate 16 (breaths/min): Photos: [1:No Photos] [N/A:N/A] Wound Location: [1:Right Malleolus - Lateral] [N/A:N/A] Wounding Event: [1:Gradually Appeared] [N/A:N/A] Primary Etiology: [1:Lymphedema] [N/A:N/A] Secondary Etiology: [1:Arterial Insufficiency Ulcer] [N/A:N/A] Comorbid History: [1:Cataracts, Congestive Heart Failure, Hypertension, Osteoarthritis, Neuropathy] [N/A:N/A] Date Acquired: [1:08/11/2015] [N/A:N/A] Weeks of Treatment: [1:32] [N/A:N/A] Wound Status: [1:Open] [N/A:N/A] Measurements L x W x D [1:2x2x0.2] [N/A:N/A] (cm) Area (cm) : [1:3.142] [N/A:N/A] Volume (cm) : [  1:0.628] [N/A:N/A] % Reduction in Area: [1:-105.10%] [N/A:N/A] % Reduction in Volume: [1:-36.80%] [N/A:N/A] Classification: [1:Full Thickness Without Exposed Support Structures] [N/A:N/A] Exudate Amount: [1:Large] [N/A:N/A] Exudate Type: [1:Serous] [N/A:N/A] Exudate Color: [1:amber] [N/A:N/A] Wound Margin: [1:Distinct, outline attached] [N/A:N/A] Granulation Amount: [1:Small (1-33%)] [N/A:N/A] Granulation Quality: [1:Red] [N/A:N/A] Necrotic Amount: [1:Large (67-100%)] [N/A:N/A] Necrotic Tissue: [1:Eschar, Adherent Slough] [N/A:N/A] Exposed Structures: [1:Fat Layer (Subcutaneous Tissue) Exposed: Yes] [N/A:N/A] Epithelialization: [1:Small (1-33%)] [N/A:N/A] Periwound Skin Texture: [1:Excoriation: Yes Induration: No Callus: No Crepitus: No Rash: No Scarring: No] [N/A:N/A] Periwound Skin Moisture: [N/A:N/A] Maceration: No Dry/Scaly: No Periwound Skin Color: Rubor: Yes N/A  N/A Atrophie Blanche: No Cyanosis: No Ecchymosis: No Erythema: No Hemosiderin Staining: No Mottled: No Pallor: No Temperature: No Abnormality N/A N/A Tenderness on Palpation: Yes N/A N/A Wound Preparation: Ulcer Cleansing: N/A N/A Rinsed/Irrigated with Saline Topical Anesthetic Applied: Other: lidocaine 4% Treatment Notes Electronic Signature(s) Signed: 03/22/2018 3:07:03 PM By: Montey Hora Entered By: Montey Hora on 03/22/2018 14:10:00 Reaney, [name] ([id number]) -------------------------------------------------------------------------------- [place] Details Patient Name: [name] Date of Service: 03/22/2018 1:45 PM Medical Record Number: [id number] Patient Account Number: [id number] Date of Birth/Sex: 10-08-1923 (84 y.o. F) Treating RN: Montey Hora Primary Care Christopher Hink: FITZGERALD, DAVID Other Clinician: Referring Melbert Botelho: FITZGERALD, DAVID Treating Jessy Cybulski/Extender: STONE III, HOYT Weeks in Treatment: 32 Active Inactive ` Abuse / Safety / Falls / Self Care Management Nursing Diagnoses: Potential for falls Goals: Patient will not experience any injury related to falls Date Initiated: 08/10/2017 Target Resolution Date: 11/10/2017 Goal Status: Active Interventions: Assess Activities of Daily Living upon admission and as needed Assess fall risk on admission and as needed Assess: immobility, friction, shearing, incontinence upon admission and as needed Notes: ` Nutrition Nursing Diagnoses: Imbalanced nutrition Potential for alteratiion in Nutrition/Potential for imbalanced nutrition Goals: Patient/caregiver agrees to and verbalizes understanding of need to use nutritional supplements and/or vitamins as prescribed Date Initiated: 08/10/2017 Target Resolution Date: 12/08/2017 Goal Status: Active Interventions: Assess patient nutrition upon admission and as needed per policy Notes: ` Orientation to the Wound Care  Program Nursing Diagnoses: Knowledge deficit related to the wound healing center program Goals: Patient/caregiver will verbalize understanding of the [facility] Program Date Initiated: 08/10/2017 Target Resolution Date: 09/08/2017 TANGELIA, SANSON ([id number]) Goal Status: Active Interventions: Provide education on orientation to the wound center Notes: ` Pain, Acute or Chronic Nursing Diagnoses: Pain, acute or chronic: actual or potential Potential alteration in comfort, pain Goals: Patient/caregiver will verbalize adequate pain control between visits Date Initiated: 08/10/2017 Target Resolution Date: 12/08/2017 Goal Status: Active Interventions: Complete pain assessment as per visit requirements Notes: ` Wound/Skin Impairment Nursing Diagnoses: Impaired tissue integrity Knowledge deficit related to ulceration/compromised skin integrity Goals: Ulcer/skin breakdown will have a volume reduction of 80% by week 12 Date Initiated: 08/10/2017 Target Resolution Date: 12/01/2017 Goal Status: Active Interventions: Assess patient/caregiver ability to perform ulcer/skin care regimen upon admission and as needed Notes: Electronic Signature(s) Signed: 03/22/2018 3:07:03 PM By: Montey Hora Entered By: Montey Hora on 03/22/2018 14:09:54 Koop, [name] ([id number]) -------------------------------------------------------------------------------- Pain Assessment Details Patient Name: [name] Date of Service: 03/22/2018 1:45 PM Medical Record Number: [id number] Patient Account Number: [id number] Date of Birth/Sex: 14-Jan-1923 (84 y.o. F) Treating RN: Roger Shelter Primary Care Jase Reep: FITZGERALD, DAVID Other Clinician: Referring Rhylen Shaheen: FITZGERALD, DAVID Treating Braydee Shimkus/Extender: STONE III, HOYT Weeks in Treatment: 32 Active Problems Location of Pain Severity and Description of Pain Patient Has Paino No Site Locations Pain Management and  Medication Current Pain Management: Electronic Signature(s) Signed: 03/25/2018  4:40:07 PM By: Roger Shelter Entered By: Roger Shelter on 03/22/2018 13:44:47 Clift, [name] ([id number]) -------------------------------------------------------------------------------- Patient/Caregiver Education Details Patient Name: [name] Date of Service: 03/22/2018 1:45 PM Medical Record Number: [id number] Patient Account Number: [id number] Date of Birth/Gender: 14-Apr-1923 (84 y.o. F) Treating RN: Ahmed Prima Primary Care Physician: FITZGERALD, DAVID Other Clinician: Referring Physician: FITZGERALD, DAVID Treating Physician/Extender: Melburn Hake, HOYT Weeks in Treatment: 72 Education Assessment Education Provided To: Patient Education Topics Provided Wound/Skin Impairment: Handouts: Caring for Your Ulcer, Other: change dressing as ordered Methods: Demonstration, Explain/Verbal Responses: State content correctly Electronic Signature(s) Signed: 03/22/2018 4:41:07 PM By: Alric Quan Entered By: Alric Quan on 03/22/2018 14:27:11 [place], [name] ([id number]) -------------------------------------------------------------------------------- Wound Assessment Details Patient Name: [name] Date of Service: 03/22/2018 1:45 PM Medical Record Number: [id number] Patient Account Number: [id number] Date of Birth/Sex: [birth year] (84 y.o. F) Treating RN: Roger Shelter Primary Care Haylen Shelnutt: FITZGERALD, DAVID Other Clinician: Referring Kasper Mudrick: FITZGERALD, DAVID Treating Joud Ingwersen/Extender: STONE III, HOYT Weeks in Treatment: 32 Wound Status Wound Number: 1 Primary Lymphedema Etiology: Wound Location: Right Malleolus - Lateral Secondary Arterial Insufficiency Ulcer Wounding Event: Gradually Appeared Etiology: Date Acquired: 08/11/2015 Wound Status: Open Weeks Of Treatment: 32 Comorbid Cataracts, Congestive Heart Failure, Clustered Wound: No History:  Hypertension, Osteoarthritis, Neuropathy Photos Photo Uploaded By: Roger Shelter on 03/22/2018 16:09:33 Wound Measurements Length: (cm) 2 Width: (cm) 2 Depth: (cm) 0.2 Area: (cm) 3.142 Volume: (cm) 0.628 % Reduction in Area: -105.1% % Reduction in Volume: -36.8% Epithelialization: Small (1-33%) Tunneling: No Undermining: No Wound Description Full Thickness Without Exposed Support Foul Classification: Structures Slou Wound Margin: Distinct, outline attached Exudate Large Amount: Exudate Type: Serous Exudate Color: amber Odor After Cleansing: No gh/Fibrino Yes Wound Bed Granulation Amount: Small (1-33%) Exposed Structure Granulation Quality: Red Fat Layer (Subcutaneous Tissue) Exposed: Yes Necrotic Amount: Large (67-100%) Necrotic Quality: Eschar, Adherent Slough Periwound Skin Texture Texture Color Gianino, [name] ([id number]) No Abnormalities Noted: No No Abnormalities Noted: No Callus: No Atrophie Blanche: No Crepitus: No Cyanosis: No Excoriation: Yes Ecchymosis: No Induration: No Erythema: No Rash: No Hemosiderin Staining: No Scarring: No Mottled: No Pallor: No Moisture Rubor: Yes No Abnormalities Noted: No Dry / Scaly: No Temperature / Pain Maceration: No Temperature: No Abnormality Tenderness on Palpation: Yes Wound Preparation Ulcer Cleansing: Rinsed/Irrigated with Saline Topical Anesthetic Applied: Other: lidocaine 4%, Treatment Notes Wound #1 (Right, Lateral Malleolus) 1. Cleansed with: Clean wound with Normal Saline 2. Anesthetic Topical Lidocaine 4% cream to wound bed prior to debridement 3. Peri-wound Care: Skin Prep 4. Dressing Applied: Prisma Ag 5. Secondary Dressing Applied Bordered Foam Dressing Notes [place] Electronic Signature(s) Signed: 03/25/2018 4:40:07 PM By: Roger Shelter Entered By: Roger Shelter on 03/22/2018 13:48:24 Swickard, [name]  ([id number]) -------------------------------------------------------------------------------- [place] Details Patient Name: [name] Date of Service: 03/22/2018 1:45 PM Medical Record Number: [id number] Patient Account Number: [id number] Date of Birth/Sex: 03-27-1923 (84 y.o. F) Treating RN: Roger Shelter Primary Care Boris Engelmann: FITZGERALD, DAVID Other Clinician: Referring Saben Donigan: FITZGERALD, DAVID Treating Jisel Fleet/Extender: STONE III, HOYT Weeks in Treatment: 32 Vital Signs Time Taken: 13:44 Temperature (F): 98.5 Height (in): 65 Pulse (bpm): 91 Weight (lbs): 154.3 Respiratory Rate (breaths/min): 16 Body Mass Index (BMI): 25.7 Blood Pressure (mmHg): 123/67 Reference Range: 80 - 120 mg / dl Electronic Signature(s) Signed: 03/25/2018 4:40:07 PM By: Roger Shelter Entered By: Roger Shelter on 03/22/2018 13:45:07

## 2018-03-29 ENCOUNTER — Ambulatory Visit (INDEPENDENT_AMBULATORY_CARE_PROVIDER_SITE_OTHER): Payer: Medicare Other

## 2018-03-29 ENCOUNTER — Ambulatory Visit (INDEPENDENT_AMBULATORY_CARE_PROVIDER_SITE_OTHER): Payer: Medicare Other | Admitting: Vascular Surgery

## 2018-03-29 ENCOUNTER — Encounter (INDEPENDENT_AMBULATORY_CARE_PROVIDER_SITE_OTHER): Payer: Self-pay | Admitting: Vascular Surgery

## 2018-03-29 ENCOUNTER — Encounter: Payer: Medicare Other | Admitting: Physician Assistant

## 2018-03-29 VITALS — BP 150/91 | HR 93 | Resp 15 | Ht 64.0 in | Wt 145.0 lb

## 2018-03-29 DIAGNOSIS — I739 Peripheral vascular disease, unspecified: Secondary | ICD-10-CM

## 2018-03-29 DIAGNOSIS — L97312 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d: Secondary | ICD-10-CM | POA: Diagnosis not present

## 2018-03-29 DIAGNOSIS — L97311 Non-pressure chronic ulcer of right ankle limited to breakdown of skin: Secondary | ICD-10-CM | POA: Diagnosis not present

## 2018-03-29 DIAGNOSIS — I480 Paroxysmal atrial fibrillation: Secondary | ICD-10-CM | POA: Diagnosis not present

## 2018-03-29 DIAGNOSIS — E785 Hyperlipidemia, unspecified: Secondary | ICD-10-CM | POA: Diagnosis not present

## 2018-03-29 NOTE — Progress Notes (Signed)
OMESHA, [name] ([id number]) Visit Report for 03/22/2018 Chief Complaint Document Details Patient Name: [name], [name]. Date of Service: 03/22/2018 1:45 PM Medical Record Number: [id number] Patient Account Number: [id number] Date of Birth/Sex: 1923/11/24 (84 y.o. F) Treating RN: Montey Hora Primary Care Provider: FITZGERALD, DAVID Other Clinician: Referring Provider: FITZGERALD, DAVID Treating Provider/Extender: STONE III, Delberta Folts Weeks in Treatment: 32 Information Obtained from: Patient Chief Complaint Patient presents to the wound care center for a consult due non healing wound to the right lateral malleolus Electronic Signature(s) Signed: 03/22/2018 5:53:05 PM By: Worthy Keeler PA-C Entered By: Worthy Keeler on 03/22/2018 14:00:31 Kilgore, [name] ([id number]) -------------------------------------------------------------------------------- Debridement Details Patient Name: [name] Date of Service: 03/22/2018 1:45 PM Medical Record Number: [id number] Patient Account Number: [id number] Date of Birth/Sex: 1923-01-23 (84 y.o. F) Treating RN: Montey Hora Primary Care Provider: FITZGERALD, DAVID Other Clinician: Referring Provider: FITZGERALD, DAVID Treating Provider/Extender: STONE III, Blayn Whetsell Weeks in Treatment: 32 Debridement Performed for Wound #1 Right,Lateral Malleolus Assessment: Performed By: Physician STONE III, Jacia Sickman E., PA-C Debridement Type: Debridement Severity of Tissue Pre Fat layer exposed Debridement: Pre-procedure Verification/Time Yes - 14:10 Out Taken: Start Time: 14:10 Pain Control: Lidocaine 4% Topical Solution Total Area Debrided (L x W): 1 (cm) x 1 (cm) = 1 (cm) Tissue and other material Viable, Non-Viable, Slough, Subcutaneous, Slough debrided: Level: Skin/Subcutaneous Tissue Debridement Description: Excisional Instrument: Curette Bleeding: Minimum Hemostasis Achieved: Pressure End Time: 14:15 Procedural Pain: 0 Post  Procedural Pain: 0 Response to Treatment: Procedure was tolerated well Post Debridement Measurements of Total Wound Length: (cm) 2 Width: (cm) 2 Depth: (cm) 0.3 Volume: (cm) 0.942 Character of Wound/Ulcer Post Debridement: Improved Severity of Tissue Post Debridement: Fat layer exposed Post Procedure Diagnosis Same as Pre-procedure Electronic Signature(s) Signed: 03/22/2018 3:07:03 PM By: Montey Hora Signed: 03/22/2018 5:53:05 PM By: Worthy Keeler PA-C Entered By: Montey Hora on 03/22/2018 14:15:58 [name], [name] ([id number]) -------------------------------------------------------------------------------- HPI Details Patient Name: [name] Date of Service: 03/22/2018 1:45 PM Medical Record Number: [id number] Patient Account Number: [id number] Date of Birth/Sex: 1923/02/08 (84 y.o. 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 Dr. Amalia Hailey the podiatrist saw her on 07/20/2017 and sent her to the wound center for possible hyperbaric oxygen therapy. past medical history of peripheral vascular disease, varicose veins, status post appendectomy, basal cell carcinoma excision from the left leg, cholecystectomy, pacemaker placement, right lower extremity angiography done by Dr. dew in March 2017 with placement of a stent. there is also note of a successful ablation of the right small saphenous vein done which was reviewed by ultrasound on 10/24/2016. the patient had a right small saphenous vein ablation done on 10/20/2016. The patient has never been a smoker. She has been seen by Dr. Corene Cornea  dew the vascular surgeon who most recently saw her on 06/15/2017 for evaluation of ongoing problems with right leg swelling. She had a lower extremity arterial duplex examination done(02/13/17) which showed patent distal right superficial femoral artery stent and above-the-knee popliteal stent without evidence of restenosis. The ABI was more than 1.3 on the right and more than 1.3 on the left. This was consistent with noncompressible arteries due to medial calcification. The right great  toe pressure and PPG waveforms are within normal limits and the left great toe pressure and PPG waveforms are decreased. he recommended she continue to wear her compression stockings and continue with elevation. She is scheduled to have a noninvasive arterial study in the near future 08/16/2017 -- had a lower extremity arterial duplex examination done which showed patent distal right superficial femoral artery stent and above-the-knee popliteal stent without evidence of restenosis. The ABI was more than 1.3 on the right and more than 1.3 on the left. This was consistent with noncompressible arteries due to medial calcification. The right great toe pressure and PPG waveforms are within normal limits and the left great toe pressure and PPG waveforms are decreased. the x-ray of the right ankle has not yet been done 08/24/2017 -- had a right ankle x-ray -- IMPRESSION:1. No fracture, bone lesion or evidence of osteomyelitis. 2. Lateral soft tissue swelling with a soft tissue ulcer. she has not yet seen the vascular surgeon for review 08/31/17 on evaluation today patient's wound appears to be showing signs of improvement. She still with her appointment with vascular in order to review her results of her vascular study and then determine if any intervention would be recommended at that time. No fevers, chills, nausea, or vomiting noted at this time. She has been tolerating the dressing changes  without complication. 09/28/17 on evaluation today patient's wound appears to show signs of good improvement in regard to the granulation tissue which is surfacing. There is still a layer of slough covering the wound and the posterior portion is still significantly deeper than the anterior nonetheless there has been some good sign of things moving towards the better. She is going to go back to Dr. dew for reevaluation to ensure her blood flow is still appropriate. That will be before her next evaluation with [place] next week. No fevers, chills, nausea, or vomiting noted at this time. Patient does have some discomfort rated to be a 3-4/10 depending on activity specifically cleansing the wound makes it worse. 10/05/2017 -- the patient was seen by Dr. Lucky Cowboy last week and noninvasive studies showed a normal right ABI with brisk triphasic waveforms consistent with no arterial insufficiency including normal digital pressures. The duplex showed a patent distal right SFA stent and the proximal SFA was also normal. He was pleased with her test and thought she should have enough of perfusion for normal wound healing. He would see her back in 6 months time. 12/21/17 on evaluation today patient appears to be doing fairly well in regard to her right lateral ankle wound. Unfortunately the main issue that she is expansion at this point is that she is having some issues with what appears to be some cellulitis in the [name], [name]. ([id number]) right anterior shin. She has also been noting a little bit of uncomfortable feeling especially last night and her ankle area. I'm afraid that she made the developing a little bit of an infection. With that being said I think it is in the early stages. 12/28/17 on evaluation today patient's ankle appears to be doing excellent. She's making good progress at this point the cellulitis seems to have improved after last week's evaluation. Overall she is having no significant  discomfort which is excellent news. She does have an appointment with Dr. dew on March 29, 2018 for reevaluation in regard to the stent he placed. She seems to have excellent blood flow in the right lower extremity. 01/19/12 on evaluation today patient's wound appears to  be doing very well. In fact she does not appear to require debridement at this point, there's no evidence of infection, and overall from the standpoint of the wound she seems to be doing very well. With that being said I believe that it may be time to switch to different dressing away from the [facility] she tells me she does have a lot going on her friend actually passed away yesterday and she's also having a lot of issues with her husband this obviously is weighing heavy on her as far as your thoughts and concerns today. 01/25/18 on evaluation today patient appears to be doing fairly well in regard to her right lateral malleolus. She has been tolerating the dressing changes without complication. Overall I feel like this is definitely showing signs of improvement as far as how the overall appearance of the wound is there's also evidence of epithelium start to migrate over the granulation tissue. In general I think that she is progressing nicely as far as the wound is concerned. The only concern she really has is whether or not we can switch to every other week visits in order to avoid having as many appointments as her daughters have a difficult time getting her to her appointments as well as the patient's husband to his he is not doing very well at this point. 02/22/18 on evaluation today patient's right lateral malleolus ulcer appears to be doing great. She has been tolerating the dressing changes without complication. Overall you making excellent progress at this time. Patient is having no significant discomfort. 03/15/18 on evaluation today patient appears to be doing much more poorly in regard to her right lateral  ankle ulcer at this point. Unfortunately since have last seen her her husband has passed just a few days ago is obviously weighed heavily on her her daughter also had surgery well she is with her today as usual. There does not appear to be any evidence of infection she does seem to have significant contusion/deep tissue injury to the right lateral malleolus which was not noted previous when I saw her last. It's hard to tell of exactly when this injury occurred although during the time she was spending the night in the hospital this may have been most likely. 03/22/18 on evaluation today patient appears to actually be doing very well in regard to her ulcer. She did unfortunately have a setback which was noted last week however the good news is we seem to be getting back on track and in fact the wound in the core did still have some necrotic tissue which will be addressed at this point today but in general I'm seeing signs that things are on the up and up. She is glad to hear this obviously she's been somewhat concerned that due to the how her wound digressed more recently. Electronic Signature(s) Signed: 03/22/2018 5:53:05 PM By: Worthy Keeler PA-C Entered By: Worthy Keeler on 03/22/2018 17:22:53 [name], [name] ([id number]) -------------------------------------------------------------------------------- Physical Exam Details Patient Name: [name] Date of Service: 03/22/2018 1:45 PM Medical Record Number: [id number] Patient Account Number: [id number] Date of Birth/Sex: [birth year] (84 y.o. F) Treating RN: Montey Hora Primary Care Provider: FITZGERALD, DAVID Other Clinician: Referring Provider: FITZGERALD, DAVID Treating Provider/Extender: STONE III, Keshan Reha Weeks in Treatment: 68 Constitutional Well-nourished and well-hydrated in no acute distress. Respiratory normal breathing without difficulty. Psychiatric this patient is able to make decisions and demonstrates good  insight into disease process. Alert and Oriented x 3. pleasant and  cooperative. Notes Patient's wound did require sharp debridement in the central portion of the wound today and she tolerated this without complication. Post debridement the wound appear to be doing much better. Overall I do feel like we finally got back down to good tissue so this can hopefully start filling in I'm gonna recommend a change in her dressings. Electronic Signature(s) Signed: 03/22/2018 5:53:05 PM By: Worthy Keeler PA-C Entered By: Worthy Keeler on 03/22/2018 17:23:31 [name], [name] ([id number]) -------------------------------------------------------------------------------- Physician Orders Details Patient Name: [name] Date of Service: 03/22/2018 1:45 PM Medical Record Number: [id number] Patient Account Number: [id number] Date of Birth/Sex: 05-Sep-1923 (84 y.o. 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 o Cleanse wound with mild soap and water o May Shower, gently pat wound dry prior to applying new dressing. Anesthetic (add to Medication List) Wound #1 Right,Lateral Malleolus o Topical Lidocaine 4% cream applied to wound bed prior to debridement (In Clinic Only). Primary Wound Dressing Wound #1 Right,Lateral Malleolus o Silver Collagen Secondary Dressing Wound #1 Right,Lateral Malleolus o Boardered Foam Dressing Dressing Change Frequency Wound #1  Right,Lateral Malleolus o Change dressing every day. Follow-up Appointments Wound #1 Right,Lateral Malleolus o Return Appointment in 1 week. Edema Control Wound #1 Right,Lateral Malleolus o Elevate legs to the level of the heart and pump ankles as often as possible Off-Loading Wound #1 Right,Lateral Malleolus o Turn and reposition every 2 hours [place], [name] ([id number]) o Other: - do not put pressure on your right ankle Additional Orders / Instructions Wound #1 Right,Lateral Malleolus o Increase protein intake. o Other: - Add Vitamin C, Zinc, Vitamin A to your diet Electronic Signature(s) Signed: 03/22/2018 3:07:03 PM By: Montey Hora Signed: 03/22/2018 5:53:05 PM By: Worthy Keeler PA-C Entered By: Montey Hora on 03/22/2018 14:18:07 [name], [name] ([id number]) -------------------------------------------------------------------------------- Problem List Details Patient Name: [name] Date of Service: 03/22/2018 1:45 PM Medical Record Number: [id number] Patient Account Number: [id number] Date of Birth/Sex: June 09, 1923 (84 y.o. F) Treating RN: Montey Hora Primary Care Provider: FITZGERALD, DAVID Other Clinician: Referring Provider: FITZGERALD, DAVID Treating Provider/Extender: STONE III, Cong Hightower Weeks in Treatment: 32 Active Problems ICD-10 Impacting Encounter Code Description Active Date Wound Healing Diagnosis L97.312 Non-pressure chronic ulcer of right ankle with fat layer 08/10/2017 Yes exposed I70.233 Atherosclerosis of native arteries of right leg with ulceration of 08/10/2017 Yes ankle I89.0 Lymphedema, not elsewhere classified 08/10/2017 Yes B35.4 Tinea corporis 09/28/2017 Yes Inactive Problems Resolved Problems Electronic Signature(s) Signed: 03/22/2018 5:53:05 PM By: Worthy Keeler PA-C Entered By: Worthy Keeler on 03/22/2018 14:00:23 [name], [name]  ([id number]) -------------------------------------------------------------------------------- Progress Note Details Patient Name: [name] Date of Service: 03/22/2018 1:45 PM Medical Record Number: [id number] Patient Account Number: [id number] Date of Birth/Sex: 12-04-1923 (84 y.o. 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 Dr. Amalia Hailey the podiatrist saw her on 07/20/2017 and sent her to the wound center for possible hyperbaric oxygen therapy. past medical history of peripheral vascular disease, varicose veins, status post appendectomy, basal cell carcinoma excision from the left leg, cholecystectomy, pacemaker placement, right lower extremity angiography done by Dr. dew in March 2017 with placement of a stent. there is also note of a successful ablation of the right small saphenous vein done which was reviewed by ultrasound on 10/24/2016. the patient had a right small saphenous vein ablation done on 10/20/2016. The patient has never been a smoker. She has been seen by Dr. Corene Cornea dew the vascular surgeon who most recently saw her on 06/15/2017 for evaluation of ongoing problems with right leg swelling. She had a lower extremity arterial duplex examination done(02/13/17) which showed patent distal right superficial femoral artery stent and above-the-knee popliteal stent without evidence of restenosis. The ABI was more than 1.3 on the right and  more than 1.3 on the left. This was consistent with noncompressible arteries due to medial calcification. The right great toe pressure and PPG waveforms are within normal limits and the left great toe pressure and PPG waveforms are decreased. he recommended she continue to wear her compression stockings and continue with elevation. She is scheduled to have a noninvasive arterial study in the near future 08/16/2017 -- had a lower extremity arterial duplex examination done which showed patent distal right superficial femoral artery stent and above-the-knee popliteal stent without evidence of restenosis. The ABI was more than 1.3 on the right and more than 1.3 on the left. This was consistent with noncompressible arteries due to medial calcification. The right great toe pressure and PPG waveforms are within normal limits and the left great toe pressure and PPG waveforms are decreased. the x-ray of the right ankle has not yet been done 08/24/2017 -- had a right ankle x-ray -- IMPRESSION:1. No fracture, bone lesion or evidence of osteomyelitis. 2. Lateral soft tissue swelling with a soft tissue ulcer. she has not yet seen the vascular surgeon for review 08/31/17 on evaluation today patient's wound appears to be showing signs of improvement. She still with her appointment with vascular in order to review her results of her vascular study and then determine if any intervention would be recommended at that time. No fevers, chills, nausea, or vomiting noted at this time. She has been tolerating the dressing changes without complication. 09/28/17 on evaluation today patient's wound appears to show signs of good improvement in regard to the granulation tissue which is surfacing. There is still a layer of slough covering the wound and the posterior portion is still significantly deeper than the anterior nonetheless there has been some good sign of things moving towards the better. She is going to go back to  Dr. dew for reevaluation to ensure her blood flow is still appropriate. That will be before her next evaluation with [place] next week. No fevers, chills, nausea, or vomiting noted at this time. Patient does have some discomfort rated to be a 3-4/10 depending on activity specifically cleansing the wound makes it worse. 10/05/2017 -- the patient was seen by Dr. Lucky Cowboy last week and noninvasive studies showed a normal right ABI with brisk SHALONDA, SACHSE. ([id number]) triphasic waveforms consistent with no arterial insufficiency including normal digital pressures. The duplex showed a  patent distal right SFA stent and the proximal SFA was also normal. He was pleased with her test and thought she should have enough of perfusion for normal wound healing. He would see her back in 6 months time. 12/21/17 on evaluation today patient appears to be doing fairly well in regard to her right lateral ankle wound. Unfortunately the main issue that she is expansion at this point is that she is having some issues with what appears to be some cellulitis in the right anterior shin. She has also been noting a little bit of uncomfortable feeling especially last night and her ankle area. I'm afraid that she made the developing a little bit of an infection. With that being said I think it is in the early stages. 12/28/17 on evaluation today patient's ankle appears to be doing excellent. She's making good progress at this point the cellulitis seems to have improved after last week's evaluation. Overall she is having no significant discomfort which is excellent news. She does have an appointment with Dr. dew on March 29, 2018 for reevaluation in regard to the stent he placed. She seems to have excellent blood flow in the right lower extremity. 01/19/12 on evaluation today patient's wound appears to be doing very well. In fact she does not appear to require debridement at this point, there's no evidence of infection, and overall  from the standpoint of the wound she seems to be doing very well. With that being said I believe that it may be time to switch to different dressing away from the [facility] she tells me she does have a lot going on her friend actually passed away yesterday and she's also having a lot of issues with her husband this obviously is weighing heavy on her as far as your thoughts and concerns today. 01/25/18 on evaluation today patient appears to be doing fairly well in regard to her right lateral malleolus. She has been tolerating the dressing changes without complication. Overall I feel like this is definitely showing signs of improvement as far as how the overall appearance of the wound is there's also evidence of epithelium start to migrate over the granulation tissue. In general I think that she is progressing nicely as far as the wound is concerned. The only concern she really has is whether or not we can switch to every other week visits in order to avoid having as many appointments as her daughters have a difficult time getting her to her appointments as well as the patient's husband to his he is not doing very well at this point. 02/22/18 on evaluation today patient's right lateral malleolus ulcer appears to be doing great. She has been tolerating the dressing changes without complication. Overall you making excellent progress at this time. Patient is having no significant discomfort. 03/15/18 on evaluation today patient appears to be doing much more poorly in regard to her right lateral ankle ulcer at this point. Unfortunately since have last seen her her husband has passed just a few days ago is obviously weighed heavily on her her daughter also had surgery well she is with her today as usual. There does not appear to be any evidence of infection she does seem to have significant contusion/deep tissue injury to the right lateral malleolus which was not noted previous when I saw her  last. It's hard to tell of exactly when this injury occurred although during the time she was spending the night in the hospital this may have been most  likely. 03/22/18 on evaluation today patient appears to actually be doing very well in regard to her ulcer. She did unfortunately have a setback which was noted last week however the good news is we seem to be getting back on track and in fact the wound in the core did still have some necrotic tissue which will be addressed at this point today but in general I'm seeing signs that things are on the up and up. She is glad to hear this obviously she's been somewhat concerned that due to the how her wound digressed more recently. Patient History Information obtained from Patient. Family History Cancer - Father,Siblings, Heart Disease - Siblings, No family history of Diabetes, Hereditary Spherocytosis, Hypertension, Kidney Disease, Lung Disease, Seizures, Stroke, Thyroid Problems, Tuberculosis. Social History Never smoker, Marital Status - Married, Alcohol Use - Never, Drug Use - No History, Caffeine Use - Rarely. Review of Systems (ROS) Constitutional Symptoms (General Health) Denies complaints or symptoms of Fever, Chills. Respiratory [name], [name] ([id number]) The patient has no complaints or symptoms. Cardiovascular The patient has no complaints or symptoms. Psychiatric The patient has no complaints or symptoms. Objective Constitutional Well-nourished and well-hydrated in no acute distress. Vitals Time Taken: 1:44 PM, Height: 65 in, Weight: 154.3 lbs, BMI: 25.7, Temperature: 98.5 F, Pulse: 91 bpm, Respiratory Rate: 16 breaths/min, Blood Pressure: 123/67 mmHg. Respiratory normal breathing without difficulty. Psychiatric this patient is able to make decisions and demonstrates good insight into disease process. Alert and Oriented x 3. pleasant and cooperative. General Notes: Patient's wound did require sharp debridement in the  central portion of the wound today and she tolerated this without complication. Post debridement the wound appear to be doing much better. Overall I do feel like we finally got back down to good tissue so this can hopefully start filling in I'm gonna recommend a change in her dressings. Integumentary (Hair, Skin) Wound #1 status is Open. Original cause of wound was Gradually Appeared. The wound is located on the Right,Lateral Malleolus. The wound measures 2cm length x 2cm width x 0.2cm depth; 3.142cm^2 area and 0.628cm^3 volume. There is Fat Layer (Subcutaneous Tissue) Exposed exposed. There is no tunneling or undermining noted. There is a large amount of serous drainage noted. The wound margin is distinct with the outline attached to the wound base. There is small (1-33%) red granulation within the wound bed. There is a large (67-100%) amount of necrotic tissue within the wound bed including Eschar and Adherent Slough. The periwound skin appearance exhibited: Excoriation, Rubor. The periwound skin appearance did not exhibit: Callus, Crepitus, Induration, Rash, Scarring, Dry/Scaly, Maceration, Atrophie Blanche, Cyanosis, Ecchymosis, Hemosiderin Staining, Mottled, Pallor, Erythema. Periwound temperature was noted as No Abnormality. The periwound has tenderness on palpation. Assessment Active Problems ICD-10 L97.312 - 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d I70.233 - Atherosclerosis of native arteries of right leg with ulceration of ankle I89.0 - Lymphedema, not elsewhere classified B35.4 - Tinea corporis [name], [name] ([id number]) Procedures Wound #1 Pre-procedure diagnosis of Wound #1 is a Lymphedema located on the Right,Lateral Malleolus .Severity of Tissue Pre Debridement is: Fat layer exposed. There was a Excisional Skin/Subcutaneous Tissue Debridement with a total area of 1 sq cm performed by STONE III, Nilo Fallin E., PA-C. With the following instrument(s):  Curette. to remove Viable and Non-Viable tissue/material Material removed includes Subcutaneous Tissue, and [place] after achieving pain control using Lidocaine 4% Topical Solution. No specimens were taken. A time out was conducted at 14:10, prior to  the start of the procedure. A Minimum amount of bleeding was controlled with Pressure. The procedure was tolerated well with a pain level of 0 throughout and a pain level of 0 following the procedure. Post Debridement Measurements: 2cm length x 2cm width x 0.3cm depth; 0.942cm^3 volume. Character of Wound/Ulcer Post Debridement is improved. Severity of Tissue Post Debridement is: Fat layer exposed. Post procedure Diagnosis Wound #1: Same as Pre-Procedure Plan Wound Cleansing: Wound #1 Right,Lateral Malleolus: Clean wound with Normal Saline. Cleanse wound with mild soap and water May Shower, gently pat wound dry prior to applying new dressing. Anesthetic (add to Medication List): Wound #1 Right,Lateral Malleolus: Topical Lidocaine 4% cream applied to wound bed prior to debridement (In Clinic Only). Primary Wound Dressing: Wound #1 Right,Lateral Malleolus: Silver Collagen Secondary Dressing: Wound #1 Right,Lateral Malleolus: Boardered Foam Dressing Dressing Change Frequency: Wound #1 Right,Lateral Malleolus: Change dressing every day. Follow-up Appointments: Wound #1 Right,Lateral Malleolus: Return Appointment in 1 week. Edema Control: Wound #1 Right,Lateral Malleolus: Elevate legs to the level of the heart and pump ankles as often as possible Off-Loading: Wound #1 Right,Lateral Malleolus: Turn and reposition every 2 hours Other: - do not put pressure on your right ankle Additional Orders / Instructions: Wound #1 Right,Lateral Malleolus: Increase protein intake. Other: - Add Vitamin C, Zinc, Vitamin A to your diet [name], [name] ([id number]) We will switch to Prisma at this point to help with filling in in regard to  this wound and she is in agreement with this plan. Will subsequently see were things stand in one week when we see her for reevaluation. Please see above for specific wound care orders. We will see patient for re-evaluation in 1 week(s) here in the clinic. If anything worsens or changes patient will contact our office for additional recommendations. Electronic Signature(s) Signed: 03/22/2018 5:53:05 PM By: Worthy Keeler PA-C Entered By: Worthy Keeler on 03/22/2018 17:23:54 [name], [name] ([id number]) -------------------------------------------------------------------------------- ROS/PFSH Details Patient Name: [name] Date of Service: 03/22/2018 1:45 PM Medical Record Number: [id number] Patient Account Number: [id number] Date of Birth/Sex: 02/15/1923 (84 y.o. F) Treating RN: Montey Hora Primary Care Provider: FITZGERALD, DAVID Other Clinician: Referring Provider: FITZGERALD, DAVID Treating Provider/Extender: STONE III, Gailen Venne Weeks in Treatment: 32 Information Obtained From Patient Wound History Do you currently have one or more open woundso Yes How many open wounds do you currently haveo 1 Approximately how long have you had your woundso 2 yrs How have you been treating your wound(s) until nowo mupirocin, soaking in epsom salt Has your wound(s) ever healed and then re-openedo No Have you had any lab work done in the past montho No Have you tested positive for an antibiotic resistant organism (MRSA, VRE)o No Have you tested positive for osteomyelitis (bone infection)o No Have you had any tests for circulation on your legso Yes Who ordered the testo Dr. Lucky Cowboy [facility] Constitutional Symptoms (General Health) Complaints and Symptoms: Negative for: Fever; Chills Eyes Medical History: Positive for: Cataracts - surgery Respiratory Complaints and Symptoms: No Complaints or Symptoms Cardiovascular Complaints and Symptoms: No Complaints or  Symptoms Medical History: Positive for: Congestive Heart Failure; Hypertension Musculoskeletal Medical History: Positive for: Osteoarthritis Neurologic Medical History: Positive for: Neuropathy Oncologic [name], TOMASINI ([id number]) Medical History: Negative for: Received Chemotherapy; Received Radiation Psychiatric Complaints and Symptoms: No Complaints or Symptoms HBO Extended History Items Eyes: Cataracts Immunizations Pneumococcal Vaccine: Received Pneumococcal Vaccination: Yes Implantable Devices Family and Social History Cancer: Yes - Father,Siblings; Diabetes:  No; Heart Disease: Yes - Siblings; Hereditary Spherocytosis: No; Hypertension: No; Kidney Disease: No; Lung Disease: No; Seizures: No; Stroke: No; Thyroid Problems: No; Tuberculosis: No; Never smoker; Marital Status - Married; Alcohol Use: Never; Drug Use: No History; Caffeine Use: Rarely; Financial Concerns: No; Food, Clothing or Shelter Needs: No; Support System Lacking: No; Transportation Concerns: No; Advanced Directives: No; Patient does not want information on Advanced Directives; Do not resuscitate: No; Living Will: Yes (Not Provided); Medical Power of Attorney: No Physician Affirmation I have reviewed and agree with the above information. Electronic Signature(s) Signed: 03/22/2018 5:53:05 PM By: Worthy Keeler PA-C Signed: 03/26/2018 4:18:46 PM By: Montey Hora Entered By: Worthy Keeler on 03/22/2018 17:23:13 [place], [name] ([id number]) -------------------------------------------------------------------------------- SuperBill Details Patient Name: [name] Date of Service: 03/22/2018 Medical Record Number: [id number] Patient Account Number: [id number] Date of Birth/Sex: 06-Aug-1923 (84 y.o. F) Treating RN: Montey Hora Primary Care Provider: FITZGERALD, DAVID Other Clinician: Referring Provider: FITZGERALD, DAVID Treating Provider/Extender: STONE III, Ardyth Kelso Weeks in Treatment:  32 Diagnosis Coding ICD-10 Codes Code Description B34.193 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d I70.233 Atherosclerosis of native arteries of right leg with ulceration of ankle I89.0 Lymphedema, not elsewhere classified B35.4 Tinea corporis Facility Procedures CPT4 Code: [id number] Description: 35329 - DEB SUBQ TISSUE 20 SQ CM/< ICD-10 Diagnosis Description J24.268 Non-pressure chronic ulcer of right ankle with fat layer ex Modifier: posed Quantity: 1 Physician Procedures CPT4 Code: [id number] Description: 29798 - WC PHYS SUBQ TISS 20 SQ CM ICD-10 Diagnosis Description X21.194 Non-pressure chronic ulcer of right ankle with fat layer ex Modifier: posed Quantity: 1 Electronic Signature(s) Signed: 03/22/2018 5:53:05 PM By: Worthy Keeler PA-C Entered By: Worthy Keeler on 03/22/2018 17:28:00

## 2018-03-29 NOTE — Assessment & Plan Note (Signed)
Her noninvasive studies today showed normal ABIs of 1.05 on the right and 1.03 on the left.  Her right great toe pressure was normal at 100.  Her left great toe pressure was 65.  Duplex shows a 50 to 74% stenosis in the right external iliac artery and a 30 to 49% stenosis in the right common femoral artery with no other hemodynamically significant stenosis identified. At current, her perfusion is adequate for wound healing.  No intervention or invasive evaluation would be planned.  Recheck in 6 months with ABIs.

## 2018-03-29 NOTE — Progress Notes (Signed)
MRN : [id number]  [name] is a 84 y.o. (11-Mar-1923) female who presents with chief complaint of  Chief Complaint  Patient presents with  . Follow-up    6 month ABI and Arterial f/u  .  History of Present Illness: Patient returns today in follow up of PAD.  She has a recurrent wound on her right lateral ankle.  This nearly healed, but then recurred.  Her husband recently passed away and she has been up on her feet more lately in both of her legs are swelling.  Not very painful.  No fevers or chills.  Her noninvasive studies today showed normal ABIs of 1.05 on the right and 1.03 on the left.  Her right great toe pressure was normal at 100.  Her left great toe pressure was 65.  Duplex shows a 50 to 74% stenosis in the right external iliac artery and a 30 to 49% stenosis in the right common femoral artery with no other hemodynamically significant stenosis identified.  Current Outpatient Medications  Medication Sig Dispense Refill  . acetaminophen (TYLENOL) 650 MG CR tablet Take 650 mg by mouth every 8 (eight) hours as needed for pain.    Marland Kitchen aspirin EC 81 MG tablet Take 81 mg by mouth daily.    . benzonatate (TESSALON) 100 MG capsule Take by mouth.    . Biotin 1000 MCG tablet Take 1,000 mcg by mouth daily.    . calcium carbonate (OSCAL) 1500 (600 Ca) MG TABS tablet Take 600 mg of elemental calcium by mouth daily.    Marland Kitchen CARTIA XT 300 MG 24 hr capsule Take 300 mg by mouth daily.     . Cholecalciferol (VITAMIN D) 2000 units CAPS Take 2,000 Units by mouth daily.    . dabigatran (PRADAXA) 150 MG CAPS capsule Take 150 mg by mouth 2 (two) times daily.    . fluticasone (FLONASE) 50 MCG/ACT nasal spray Place into the nose.    . furosemide (LASIX) 40 MG tablet Take 40 mg by mouth daily.    . Glucosamine 500 MG CAPS Take 500 mg by mouth daily.    Marland Kitchen levothyroxine (SYNTHROID, LEVOTHROID) 50 MCG tablet Take 50 mcg by mouth daily before breakfast.     . losartan (COZAAR) 50 MG tablet Take 50 mg by  mouth daily.    . magnesium oxide (MAG-OX) 400 MG tablet Take 400 mg by mouth daily.    . metoprolol (LOPRESSOR) 50 MG tablet Take 50 mg by mouth 2 (two) times daily.    . Multiple Vitamin (MULTIVITAMIN WITH MINERALS) TABS tablet Take 1 tablet by mouth daily.    . Multiple Vitamins-Minerals (PRESERVISION/LUTEIN PO) Take 1 capsule by mouth daily.    . mupirocin ointment (BACTROBAN) 2 % Apply to wound after soaking BID 30 g 1  . NONFORMULARY OR COMPOUNDED ITEM See pharmacy note 120 each 2  . omeprazole (PRILOSEC) 20 MG capsule Take by mouth daily.     Marland Kitchen oseltamivir (TAMIFLU) 75 MG capsule     . potassium chloride SA (K-DUR,KLOR-CON) 20 MEQ tablet Take 20 mEq by mouth daily.    . rosuvastatin (CRESTOR) 5 MG tablet Take 5 mg by mouth every Monday, Wednesday, and Friday.    . tolterodine (DETROL) 2 MG tablet Take 2 mg by mouth daily.    . traMADol (ULTRAM) 50 MG tablet Take 50 mg by mouth every 6 (six) hours as needed for pain.    . vitamin B-12 (CYANOCOBALAMIN) 1000 MCG tablet Take 1,000  mcg by mouth daily.    Marland Kitchen TAZTIA XT 300 MG 24 hr capsule      No current facility-administered medications for this visit.     Past Medical History:  Diagnosis Date  . Peripheral vascular disease ([place])   . Varicose veins     Past Surgical History:  Procedure Laterality Date  . APPENDECTOMY    . BASAL CELL CARCINOMA EXCISION Left   . BREAST CYST EXCISION Left   . CATARACT EXTRACTION Left   . CHOLECYSTECTOMY    . EYE SURGERY    . PACEMAKER PLACEMENT    . PERIPHERAL VASCULAR CATHETERIZATION Right 10/09/2016   Procedure: Lower Extremity Angiography;  Surgeon: Algernon Huxley, MD;  Location: [facility];  Service: Cardiovascular;  Laterality: Right;  . PERIPHERAL VASCULAR CATHETERIZATION  10/09/2016   Procedure: Lower Extremity Intervention;  Surgeon: Algernon Huxley, MD;  Location: [facility];  Service: Cardiovascular;;  . TONSILLECTOMY           Family History  Problem Relation Age of  Onset  . Stroke Mother   . Cancer Father    No bleeding or clotting disorders  Social History     Social History  Substance Use Topics  . Smoking status: Never Smoker  . Smokeless tobacco: Not on file  . Alcohol use No  Recently widowed      Allergies  Allergen Reactions  . Metronidazole Nausea And Vomiting, Other (See Comments) and Rash  . Monistat [Tioconazole] Rash and Other (See Comments)  . Sulfa Antibiotics Rash and Nausea And Vomiting             REVIEW OF SYSTEMS(Negative unless checked)  Constitutional: [] Weight loss[] Fever[] Chills Cardiac:[] Chest pain[] Chest pressure[x] Palpitations [] Shortness of breath when laying flat [] Shortness of breath at rest [] Shortness of breath with exertion. Vascular: [] Pain in legs with walking[] Pain in legsat rest[] Pain in legs when laying flat [] Claudication [] Pain in feet when walking [] Pain in feet at rest [] Pain in feet when laying flat [] History of DVT [] Phlebitis [x] Swelling in legs [x] Varicose veins [x] Non-healing ulcers Pulmonary: [] Uses home oxygen [] Productive cough[] Hemoptysis [] Wheeze [] COPD [] Asthma Neurologic: [] Dizziness [] Blackouts [] Seizures [] History of stroke [] History of TIA[] Aphasia [] Temporary blindness[] Dysphagia [] Weaknessor numbness in arms [] Weakness or numbnessin legs Musculoskeletal: [] Arthritis [] Joint swelling [] Joint pain [] Low back pain Hematologic:[] Easy bruising[] Easy bleeding [] Hypercoagulable state [] Anemic [] Hepatitis Gastrointestinal:[] Blood in stool[] Vomiting blood[] Gastroesophageal reflux/heartburn[] Abdominal pain Genitourinary: [] Chronic kidney disease [] Difficulturination [] Frequenturination [] Burning with urination[] Hematuria Skin: [] Rashes [x] Ulcers [x] Wounds Psychological: [] History of anxiety[] History of major depression.    Physical  Examination  BP (!) 150/91 (BP Location: Right Arm, Patient Position: Sitting)   Pulse 93   Resp 15   Ht 5\' 4"  (1.626 m)   Wt 65.8 kg (145 lb)   BMI 24.89 kg/m  Gen:  WD/WN, NAD.  Appears younger than stated age Head: [place]/AT, No temporalis wasting. Ear/Nose/Throat: Hearing grossly intact, nares w/o erythema or drainage Eyes: Conjunctiva clear. Sclera non-icteric Neck: Supple.  Trachea midline Pulmonary:  Good air movement, no use of accessory muscles.  Cardiac: Irregularly irregular Vascular:  Vessel Right Left  Radial Palpable Palpable                          PT  trace palpable  1+ palpable  DP  1+ palpable  1+ palpable    Musculoskeletal: M/S 5/5 throughout.  No deformity or atrophy.  Moderate stasis dermatitis changes bilaterally.  1+ bilateral lower extremity edema. Neurologic: Sensation grossly intact in extremities.  Symmetrical.  Speech is fluent.  Psychiatric: Judgment intact, Mood & affect appropriate for pt's clinical situation. Dermatologic: Right lateral ankle ulceration currently dressed.       Labs No results found for this or any previous visit (from the past 2160 hour(s)).  Radiology No results found.  Assessment/Plan Paroxysmal atrial fibrillation (HCC) On anticoagulation  Varicose veins of lower extremities with ulcer (HCC) Swelling better and wound seems to be improving. Continue compression stockings and elevation  HLD (hyperlipidemia) lipid control important in reducing the progression of atherosclerotic disease. Continue statin therapy   Lower limb ulcer, ankle, right, limited to breakdown of skin (HCC) Recurrent.  Sees the wound care center.  Should have adequate perfusion for wound healing.  PVD (peripheral vascular disease) ([place]) Her noninvasive studies today showed normal ABIs of 1.05 on the right and 1.03 on the left.  Her right great toe pressure was normal at 100.  Her left great toe pressure was 65.  Duplex shows a 50 to 74%  stenosis in the right external iliac artery and a 30 to 49% stenosis in the right common femoral artery with no other hemodynamically significant stenosis identified. At current, her perfusion is adequate for wound healing.  No intervention or invasive evaluation would be planned.  Recheck in 6 months with ABIs.    Leotis Pain, MD  03/29/2018 12:35 PM    This note was created with Dragon medical transcription system.  Any errors from dictation are purely unintentional

## 2018-03-29 NOTE — Patient Instructions (Signed)
Peripheral Vascular Disease Peripheral vascular disease (PVD) is a disease of the blood vessels that are not part of your heart and brain. A simple term for PVD is poor circulation. In most cases, PVD narrows the blood vessels that carry blood from your heart to the rest of your body. This can result in a decreased supply of blood to your arms, legs, and internal organs, like your stomach or kidneys. However, it most often affects a person's lower legs and feet. There are two types of PVD.  Organic PVD. This is the more common type. It is caused by damage to the structure of blood vessels.  Functional PVD. This is caused by conditions that make blood vessels contract and tighten (spasm).  Without treatment, PVD tends to get worse over time. PVD can also lead to acute ischemic limb. This is when an arm or limb suddenly has trouble getting enough blood. This is a medical emergency. What are the causes? Each type of PVD has many different causes. The most common cause of PVD is buildup of a fatty material (plaque) inside of your arteries (atherosclerosis). Small amounts of plaque can break off from the walls of the blood vessels and become lodged in a smaller artery. This blocks blood flow and can cause acute ischemic limb. Other common causes of PVD include:  Blood clots that form inside of blood vessels.  Injuries to blood vessels.  Diseases that cause inflammation of blood vessels or cause blood vessel spasms.  Health behaviors and health history that increase your risk of developing PVD.  What increases the risk? You may have a greater risk of PVD if you:  Have a family history of PVD.  Have certain medical conditions, including: ? High cholesterol. ? Diabetes. ? High blood pressure (hypertension). ? Coronary heart disease. ? Past problems with blood clots. ? Past injury, such as burns or a broken bone. These may have damaged blood vessels in your limbs. ? Buerger disease. This is  caused by inflamed blood vessels in your hands and feet. ? Some forms of arthritis. ? Rare birth defects that affect the arteries in your legs.  Use tobacco.  Do not get enough exercise.  Are obese.  Are age 50 or older.  What are the signs or symptoms? PVD may cause many different symptoms. Your symptoms depend on what part of your body is not getting enough blood. Some common signs and symptoms include:  Cramps in your lower legs. This may be a symptom of poor leg circulation (claudication).  Pain and weakness in your legs while you are physically active that goes away when you rest (intermittent claudication).  Leg pain when at rest.  Leg numbness, tingling, or weakness.  Coldness in a leg or foot, especially when compared with the other leg.  Skin or hair changes. These can include: ? Hair loss. ? Shiny skin. ? Pale or bluish skin. ? Thick toenails.  Inability to get or maintain an erection (erectile dysfunction).  People with PVD are more prone to developing ulcers and sores on their toes, feet, or legs. These may take longer than normal to heal. How is this diagnosed? Your health care provider may diagnose PVD from your signs and symptoms. The health care provider will also do a physical exam. You may have tests to find out what is causing your PVD and determine its severity. Tests may include:  Blood pressure recordings from your arms and legs and measurements of the strength of your pulses (  pulse volume recordings).  Imaging studies using sound waves to take pictures of the blood flow through your blood vessels (Doppler ultrasound).  Injecting a dye into your blood vessels before having imaging studies using: ? X-rays (angiogram or arteriogram). ? Computer-generated X-rays (CT angiogram). ? A powerful electromagnetic field and a computer (magnetic resonance angiogram or MRA).  How is this treated? Treatment for PVD depends on the cause of your condition and the  severity of your symptoms. It also depends on your age. Underlying causes need to be treated and controlled. These include long-lasting (chronic) conditions, such as diabetes, high cholesterol, and high blood pressure. You may need to first try making lifestyle changes and taking medicines. Surgery may be needed if these do not work. Lifestyle changes may include:  Quitting smoking.  Exercising regularly.  Following a low-fat, low-cholesterol diet.  Medicines may include:  Blood thinners to prevent blood clots.  Medicines to improve blood flow.  Medicines to improve your blood cholesterol levels.  Surgical procedures may include:  A procedure that uses an inflated balloon to open a blocked artery and improve blood flow (angioplasty).  A procedure to put in a tube (stent) to keep a blocked artery open (stent implant).  Surgery to reroute blood flow around a blocked artery (peripheral bypass surgery).  Surgery to remove dead tissue from an infected wound on the affected limb.  Amputation. This is surgical removal of the affected limb. This may be necessary in cases of acute ischemic limb that are not improved through medical or surgical treatments.  Follow these instructions at home:  Take medicines only as directed by your health care provider.  Do not use any tobacco products, including cigarettes, chewing tobacco, or electronic cigarettes. If you need help quitting, ask your health care provider.  Lose weight if you are overweight, and maintain a healthy weight as directed by your health care provider.  Eat a diet that is low in fat and cholesterol. If you need help, ask your health care provider.  Exercise regularly. Ask your health care provider to suggest some good activities for you.  Use compression stockings or other mechanical devices as directed by your health care provider.  Take good care of your feet. ? Wear comfortable shoes that fit well. ? Check your feet  often for any cuts or sores. Contact a health care provider if:  You have cramps in your legs while walking.  You have leg pain when you are at rest.  You have coldness in a leg or foot.  Your skin changes.  You have erectile dysfunction.  You have cuts or sores on your feet that are not healing. Get help right away if:  Your arm or leg turns cold and blue.  Your arms or legs become red, warm, swollen, painful, or numb.  You have chest pain or trouble breathing.  You suddenly have weakness in your face, arm, or leg.  You become very confused or lose the ability to speak.  You suddenly have a very bad headache or lose your vision. This information is not intended to replace advice given to you by your health care provider. Make sure you discuss any questions you have with your health care provider. Document Released: 12/28/2004 Document Revised: 04/27/2016 Document Reviewed: 04/30/2014 Elsevier Interactive Patient Education  2017 [employer]  

## 2018-03-29 NOTE — Assessment & Plan Note (Signed)
Recurrent.  Sees the wound care center.  Should have adequate perfusion for wound healing.

## 2018-04-02 NOTE — Progress Notes (Signed)
[name], [name] ([id number]) Visit Report for 03/29/2018 Arrival Information Details Patient Name: [name], [name] Date of Service: 03/29/2018 12:30 PM Medical Record Number: [id number] Patient Account Number: [id number] Date of Birth/Sex: Dec 08, 1922 (84 y.o. F) Treating RN: Ahmed Prima Primary Care Sueko Dimichele: FITZGERALD, DAVID Other Clinician: Referring Tavyn Kurka: FITZGERALD, DAVID Treating Hebe Merriwether/Extender: STONE III, HOYT Weeks in Treatment: 39 Visit Information History Since Last Visit All ordered tests and consults were completed: No Patient Arrived: Ambulatory Added or deleted any medications: No Arrival Time: 12:46 Any new allergies or adverse reactions: No Accompanied By: self Had a fall or experienced change in No Transfer Assistance: None activities of daily living that may affect Patient Identification Verified: Yes risk of falls: Secondary Verification Process Completed: Yes Signs or symptoms of abuse/neglect since last visito No Patient Requires Transmission-Based No Hospitalized since last visit: No Precautions: Implantable device outside of the clinic excluding No Patient Has Alerts: No cellular tissue based products placed in the center since last visit: Has Dressing in Place as Prescribed: Yes Has Compression in Place as Prescribed: Yes Pain Present Now: No Electronic Signature(s) Signed: 03/29/2018 4:29:16 PM By: Alric Quan Entered By: Alric Quan on 03/29/2018 12:47:15 [name], [name] ([id number]) -------------------------------------------------------------------------------- Encounter Discharge Information Details Patient Name: [name] Date of Service: 03/29/2018 12:30 PM Medical Record Number: [id number] Patient Account Number: [id number] Date of Birth/Sex: [birth year] (84 y.o. F) Treating RN: Ahmed Prima Primary Care Jabe Jeanbaptiste: FITZGERALD, DAVID Other Clinician: Referring Brode Sculley: FITZGERALD, DAVID Treating  Sonora Catlin/Extender: STONE III, HOYT Weeks in Treatment: 59 Encounter Discharge Information Items Discharge Pain Level: 0 Discharge Condition: Stable Ambulatory Status: Ambulatory Discharge Destination: Home Private Transportation: Auto Accompanied By: daughter Schedule Follow-up Appointment: Yes Medication Reconciliation completed and provided No to Patient/Care Talullah Abate: Clinical Summary of Care: Electronic Signature(s) Signed: 03/29/2018 1:20:14 PM By: Alric Quan Entered By: Alric Quan on 03/29/2018 13:20:14 [name], [name] ([id number]) -------------------------------------------------------------------------------- Lower Extremity Assessment Details Patient Name: [name] Date of Service: 03/29/2018 12:30 PM Medical Record Number: [id number] Patient Account Number: [id number] Date of Birth/Sex: 05-Apr-1923 (84 y.o. F) Treating RN: Ahmed Prima Primary Care Tippi Mccrae: FITZGERALD, DAVID Other Clinician: Referring July Linam: FITZGERALD, DAVID Treating Christiane Sistare/Extender: STONE III, HOYT Weeks in Treatment: 33 Vascular Assessment Pulses: Dorsalis Pedis Palpable: [Right:Yes] Posterior Tibial Extremity colors, hair growth, and conditions: Extremity Color: [Right:Normal] Temperature of Extremity: [Right:Warm] Capillary Refill: [Right:< 3 seconds] Toe Nail Assessment Left: Right: Thick: Yes Discolored: Yes Deformed: Yes Improper Length and Hygiene: Yes Electronic Signature(s) Signed: 03/29/2018 4:29:16 PM By: Alric Quan Entered By: Alric Quan on 03/29/2018 12:54:16 [name], [name] ([id number]) -------------------------------------------------------------------------------- Multi Wound Chart Details Patient Name: [name] Date of Service: 03/29/2018 12:30 PM Medical Record Number: [id number] Patient Account Number: [id number] Date of Birth/Sex: [birth year] (84 y.o. F) Treating RN: Montey Hora Primary Care Harvey Matlack:  FITZGERALD, DAVID Other Clinician: Referring Shamecca Whitebread: FITZGERALD, DAVID Treating Kindal Ponti/Extender: STONE III, HOYT Weeks in Treatment: 33 Vital Signs Height(in): 65 Pulse(bpm): 84 Weight(lbs): 154.3 Blood Pressure(mmHg): 132/69 Body Mass Index(BMI): 26 Temperature(F): 97.7 Respiratory Rate 16 (breaths/min): Photos: [1:No Photos] [N/A:N/A] Wound Location: [1:Right Malleolus - Lateral] [N/A:N/A] Wounding Event: [1:Gradually Appeared] [N/A:N/A] Primary Etiology: [1:Lymphedema] [N/A:N/A] Secondary Etiology: [1:Arterial Insufficiency Ulcer] [N/A:N/A] Comorbid History: [1:Cataracts, Congestive Heart Failure, Hypertension, Osteoarthritis, Neuropathy] [N/A:N/A] Date Acquired: [1:08/11/2015] [N/A:N/A] Weeks of Treatment: [1:33] [N/A:N/A] Wound Status: [1:Open] [N/A:N/A] Measurements L x W x D [1:2.2x2.5x0.2] [N/A:N/A] (cm) Area (cm) : [1:4.32] [N/A:N/A] Volume (cm) : [1:0.864] [N/A:N/A] % Reduction in Area: [1:-182.00%] [N/A:N/A] % Reduction in Volume: [1:-88.20%] [N/A:N/A] Classification: [1:Full  Thickness Without Exposed Support Structures] [N/A:N/A] Exudate Amount: [1:Large] [N/A:N/A] Exudate Type: [1:Serosanguineous] [N/A:N/A] Exudate Color: [1:red, brown] [N/A:N/A] Wound Margin: [1:Distinct, outline attached] [N/A:N/A] Granulation Amount: [1:None Present (0%)] [N/A:N/A] Necrotic Amount: [1:Large (67-100%)] [N/A:N/A] Necrotic Tissue: [1:Eschar, Adherent Slough] [N/A:N/A] Exposed Structures: [1:Fat Layer (Subcutaneous Tissue) Exposed: Yes] [N/A:N/A] Epithelialization: [1:Small (1-33%)] [N/A:N/A] Periwound Skin Texture: [1:Excoriation: Yes Induration: No Callus: No Crepitus: No Rash: No Scarring: No] [N/A:N/A] Periwound Skin Moisture: [1:Maceration: Yes Dry/Scaly: No] [N/A:N/A] Periwound Skin Color: Rubor: Yes N/A N/A Atrophie Blanche: No Cyanosis: No Ecchymosis: No Erythema: No Hemosiderin Staining: No Mottled: No Pallor: No Temperature: No Abnormality N/A  N/A Tenderness on Palpation: Yes N/A N/A Wound Preparation: Ulcer Cleansing: N/A N/A Rinsed/Irrigated with Saline Topical Anesthetic Applied: Other: lidocaine 4% Treatment Notes Electronic Signature(s) Signed: 03/29/2018 4:37:07 PM By: Montey Hora Entered By: Montey Hora on 03/29/2018 13:04:18 [name], [name] ([id number]) -------------------------------------------------------------------------------- [place] Details Patient Name: [name] Date of Service: 03/29/2018 12:30 PM Medical Record Number: [id number] Patient Account Number: [id number] Date of Birth/Sex: [birth year] (84 y.o. F) Treating RN: Montey Hora Primary Care [place] Every: FITZGERALD, DAVID Other Clinician: Referring Zeppelin Beckstrand: FITZGERALD, DAVID Treating Waylon Koffler/Extender: STONE III, HOYT Weeks in Treatment: 90 Active Inactive ` Abuse / Safety / Falls / Self Care Management Nursing Diagnoses: Potential for falls Goals: Patient will not experience any injury related to falls Date Initiated: 08/10/2017 Target Resolution Date: 11/10/2017 Goal Status: Active Interventions: Assess Activities of Daily Living upon admission and as needed Assess fall risk on admission and as needed Assess: immobility, friction, shearing, incontinence upon admission and as needed Notes: ` Nutrition Nursing Diagnoses: Imbalanced nutrition Potential for alteratiion in Nutrition/Potential for imbalanced nutrition Goals: Patient/caregiver agrees to and verbalizes understanding of need to use nutritional supplements and/or vitamins as prescribed Date Initiated: 08/10/2017 Target Resolution Date: 12/08/2017 Goal Status: Active Interventions: Assess patient nutrition upon admission and as needed per policy Notes: ` Orientation to the Wound Care Program Nursing Diagnoses: Knowledge deficit related to the wound healing center program Goals: Patient/caregiver will verbalize understanding of the [place] Program Date Initiated: 08/10/2017 Target Resolution Date: 09/08/2017 [name], [name] ([id number]) Goal Status: Active Interventions: Provide education on orientation to the wound center Notes: ` Pain, Acute or Chronic Nursing Diagnoses: Pain, acute or chronic: actual or potential Potential alteration in comfort, pain Goals: Patient/caregiver will verbalize adequate pain control between visits Date Initiated: 08/10/2017 Target Resolution Date: 12/08/2017 Goal Status: Active Interventions: Complete pain assessment as per visit requirements Notes: ` Wound/Skin Impairment Nursing Diagnoses: Impaired tissue integrity Knowledge deficit related to ulceration/compromised skin integrity Goals: Ulcer/skin breakdown will have a volume reduction of 80% by week 12 Date Initiated: 08/10/2017 Target Resolution Date: 12/01/2017 Goal Status: Active Interventions: Assess patient/caregiver ability to perform ulcer/skin care regimen upon admission and as needed Notes: Electronic Signature(s) Signed: 03/29/2018 4:37:07 PM By: Montey Hora Entered By: Montey Hora on 03/29/2018 13:01:45 [name], [name] ([id number]) -------------------------------------------------------------------------------- Pain Assessment Details Patient Name: [name] Date of Service: 03/29/2018 12:30 PM Medical Record Number: [id number] Patient Account Number: [id number] Date of Birth/Sex: 1923-03-13 (84 y.o. F) Treating RN: Ahmed Prima Primary Care Glorianne Proctor: FITZGERALD, DAVID Other Clinician: Referring Muhammad Vacca: FITZGERALD, DAVID Treating Mashanda Ishibashi/Extender: STONE III, HOYT Weeks in Treatment: 33 Active Problems Location of Pain Severity and Description of Pain Patient Has Paino No Site Locations Pain Management and Medication Current Pain Management: Electronic Signature(s) Signed: 03/29/2018 4:29:16 PM By: Alric Quan Entered By: Alric Quan on 03/29/2018  12:47:21 [name], [name] ([id number]) -------------------------------------------------------------------------------- Patient/Caregiver  Education Details Patient Name: [name], [name]. Date of Service: 03/29/2018 12:30 PM Medical Record Number: [id number] Patient Account Number: [id number] Date of Birth/Gender: 01-Sep-1923 (84 y.o. F) Treating RN: Ahmed Prima Primary Care Physician: FITZGERALD, DAVID Other Clinician: Referring Physician: FITZGERALD, DAVID Treating Physician/Extender: Melburn Hake, HOYT Weeks in Treatment: 14 Education Assessment Education Provided To: Patient Education Topics Provided Wound/Skin Impairment: Handouts: Caring for Your Ulcer, Other: change dressing as ordered Methods: Demonstration, Explain/Verbal Responses: State content correctly Electronic Signature(s) Signed: 03/29/2018 4:29:16 PM By: Alric Quan Entered By: Alric Quan on 03/29/2018 13:20:32 [name], [name] ([id number]) -------------------------------------------------------------------------------- Wound Assessment Details Patient Name: [name] Date of Service: 03/29/2018 12:30 PM Medical Record Number: [id number] Patient Account Number: [id number] Date of Birth/Sex: 1923/04/11 (84 y.o. F) Treating RN: Ahmed Prima Primary Care Marvin Grabill: FITZGERALD, DAVID Other Clinician: Referring Pricella Gaugh: FITZGERALD, DAVID Treating Theone Bowell/Extender: STONE III, HOYT Weeks in Treatment: 33 Wound Status Wound Number: 1 Primary Lymphedema Etiology: Wound Location: Right Malleolus - Lateral Secondary Arterial Insufficiency Ulcer Wounding Event: Gradually Appeared Etiology: Date Acquired: 08/11/2015 Wound Status: Open Weeks Of Treatment: 33 Comorbid Cataracts, Congestive Heart Failure, Clustered Wound: No History: Hypertension, Osteoarthritis, Neuropathy Photos Photo Uploaded By: Alric Quan on 03/29/2018 16:12:10 Wound Measurements Length: (cm) 2.2 Width: (cm)  2.5 Depth: (cm) 0.2 Area: (cm) 4.32 Volume: (cm) 0.864 % Reduction in Area: -182% % Reduction in Volume: -88.2% Epithelialization: Small (1-33%) Tunneling: No Undermining: No Wound Description Full Thickness Without Exposed Support Foul Classification: Structures Slou Wound Margin: Distinct, outline attached Exudate Large Amount: Exudate Type: Serosanguineous Exudate Color: red, brown Odor After Cleansing: No gh/Fibrino Yes Wound Bed Granulation Amount: None Present (0%) Exposed Structure Necrotic Amount: Large (67-100%) Fat Layer (Subcutaneous Tissue) Exposed: Yes Necrotic Quality: Eschar, Adherent Slough Periwound Skin Texture Texture Color No Abnormalities Noted: No No Abnormalities Noted: No Callus: No Atrophie Blanche: No Crepitus: No Cyanosis: No [place], [name] ([id number]) Excoriation: Yes Ecchymosis: No Induration: No Erythema: No Rash: No Hemosiderin Staining: No Scarring: No Mottled: No Pallor: No Moisture Rubor: Yes No Abnormalities Noted: No Dry / Scaly: No Temperature / Pain Maceration: Yes Temperature: No Abnormality Tenderness on Palpation: Yes Wound Preparation Ulcer Cleansing: Rinsed/Irrigated with Saline Topical Anesthetic Applied: Other: lidocaine 4%, Treatment Notes Wound #1 (Right, Lateral Malleolus) 1. Cleansed with: Clean wound with Normal Saline 2. Anesthetic Topical Lidocaine 4% cream to wound bed prior to debridement 3. Peri-wound Care: Skin Prep 4. Dressing Applied: Prisma Ag 5. Secondary Dressing Applied Bordered Foam Dressing Notes drawtex Electronic Signature(s) Signed: 03/29/2018 4:29:16 PM By: Alric Quan Entered By: Alric Quan on 03/29/2018 12:53:28 [name], [name] ([id number]) -------------------------------------------------------------------------------- [place] Details Patient Name: [name] Date of Service: 03/29/2018 12:30 PM Medical Record Number: [id number] Patient  Account Number: [id number] Date of Birth/Sex: 01/07/1923 (84 y.o. F) Treating RN: Ahmed Prima Primary Care Dorothye Berni: FITZGERALD, DAVID Other Clinician: Referring Sheala Dosh: FITZGERALD, DAVID Treating [place]hl/Extender: STONE III, HOYT Weeks in Treatment: 33 Vital Signs Time Taken: 12:47 Temperature (F): 97.7 Height (in): 65 Pulse (bpm): 84 Weight (lbs): 154.3 Respiratory Rate (breaths/min): 16 Body Mass Index (BMI): 25.7 Blood Pressure (mmHg): 132/69 Reference Range: 80 - 120 mg / dl Electronic Signature(s) Signed: 03/29/2018 4:29:16 PM By: Alric Quan Entered By: Alric Quan on 03/29/2018 12:48:59

## 2018-04-02 NOTE — Progress Notes (Signed)
[name], [name] ([id number]) Visit Report for 03/29/2018 Chief Complaint Document Details Patient Name: [name], [name]. Date of Service: 03/29/2018 12:30 PM Medical Record Number: [id number] Patient Account Number: [id number] Date of Birth/Sex: [birth year] (84 y.o. F) Treating RN: Montey Hora Primary Care Provider: FITZGERALD, DAVID Other Clinician: Referring Provider: FITZGERALD, DAVID Treating Provider/Extender: STONE III, Massa Pe Weeks in Treatment: 1 Information Obtained from: Patient Chief Complaint Patient presents to the wound care center for a consult due non healing wound to the right lateral malleolus Electronic Signature(s) Signed: 03/29/2018 5:37:52 PM By: Worthy Keeler PA-C Entered By: Worthy Keeler on 03/29/2018 12:43:06 Falero, [name] ([id number]) -------------------------------------------------------------------------------- Debridement Details Patient Name: [name] Date of Service: 03/29/2018 12:30 PM Medical Record Number: [id number] Patient Account Number: [id number] Date of Birth/Sex: Feb 25, 1923 (84 y.o. F) Treating RN: Montey Hora Primary Care Provider: FITZGERALD, DAVID Other Clinician: Referring Provider: FITZGERALD, DAVID Treating Provider/Extender: STONE III, Chelcey Caputo Weeks in Treatment: 33 Debridement Performed for Wound #1 Right,Lateral Malleolus Assessment: Performed By: Physician STONE III, Breeze Angell E., PA-C Debridement Type: Debridement Severity of Tissue Pre Fat layer exposed Debridement: Pre-procedure Verification/Time Yes - 13:07 Out Taken: Start Time: 13:07 Pain Control: Lidocaine 4% Topical Solution Total Area Debrided (L x W): 2.2 (cm) x 2.5 (cm) = 5.5 (cm) Tissue and other material Viable, Non-Viable, Slough, Subcutaneous, Biofilm, Fibrin/Exudate, Slough debrided: Level: Skin/Subcutaneous Tissue Debridement Description: Excisional Instrument: Curette Bleeding: Minimum Hemostasis Achieved: Pressure End Time:  13:10 Procedural Pain: 0 Post Procedural Pain: 0 Response to Treatment: Procedure was tolerated well Post Debridement Measurements of Total Wound Length: (cm) 2.2 Width: (cm) 2.5 Depth: (cm) 0.3 Volume: (cm) 1.296 Character of Wound/Ulcer Post Debridement: Improved Severity of Tissue Post Debridement: Fat layer exposed Post Procedure Diagnosis Same as Pre-procedure Electronic Signature(s) Signed: 03/29/2018 4:37:07 PM By: Montey Hora Signed: 03/29/2018 5:37:52 PM By: Worthy Keeler PA-C Entered By: Montey Hora on 03/29/2018 13:10:12 Hirt, [name] ([id number]) -------------------------------------------------------------------------------- HPI Details Patient Name: [name] Date of Service: 03/29/2018 12:30 PM Medical Record Number: [id number] Patient Account Number: [id number] Date of Birth/Sex: [birth year] (84 y.o. 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 Dr. Amalia Hailey the podiatrist saw her on 07/20/2017 and sent her to the wound center for possible hyperbaric oxygen therapy. past medical history of peripheral vascular disease, varicose veins, status post appendectomy, basal cell carcinoma excision from the left leg, cholecystectomy, pacemaker placement, right lower extremity angiography done by Dr. dew in March 2017 with placement of a stent. there is also note of a successful ablation of the right small saphenous vein done which was reviewed by ultrasound on 10/24/2016. the patient had a right small saphenous vein ablation done on 10/20/2016. The patient has never been a  smoker. She has been seen by Dr. Corene Cornea dew the vascular surgeon who most recently saw her on 06/15/2017 for evaluation of ongoing problems with right leg swelling. She had a lower extremity arterial duplex examination done(02/13/17) which showed patent distal right superficial femoral artery stent and above-the-knee popliteal stent without evidence of restenosis. The ABI was more than 1.3 on the right and more than 1.3 on the left. This was consistent with noncompressible arteries due to medial calcification. The  right great toe pressure and PPG waveforms are within normal limits and the left great toe pressure and PPG waveforms are decreased. he recommended she continue to wear her compression stockings and continue with elevation. She is scheduled to have a noninvasive arterial study in the near future 08/16/2017 -- had a lower extremity arterial duplex examination done which showed patent distal right superficial femoral artery stent and above-the-knee popliteal stent without evidence of restenosis. The ABI was more than 1.3 on the right and more than 1.3 on the left. This was consistent with noncompressible arteries due to medial calcification. The right great toe pressure and PPG waveforms are within normal limits and the left great toe pressure and PPG waveforms are decreased. the x-ray of the right ankle has not yet been done 08/24/2017 -- had a right ankle x-ray -- IMPRESSION:1. No fracture, bone lesion or evidence of osteomyelitis. 2. Lateral soft tissue swelling with a soft tissue ulcer. she has not yet seen the vascular surgeon for review 08/31/17 on evaluation today patient's wound appears to be showing signs of improvement. She still with her appointment with vascular in order to review her results of her vascular study and then determine if any intervention would be recommended at that time. No fevers, chills, nausea, or vomiting noted at this time. She has been tolerating the dressing  changes without complication. 09/28/17 on evaluation today patient's wound appears to show signs of good improvement in regard to the granulation tissue which is surfacing. There is still a layer of slough covering the wound and the posterior portion is still significantly deeper than the anterior nonetheless there has been some good sign of things moving towards the better. She is going to go back to Dr. dew for reevaluation to ensure her blood flow is still appropriate. That will be before her next evaluation with [place] next week. No fevers, chills, nausea, or vomiting noted at this time. Patient does have some discomfort rated to be a 3-4/10 depending on activity specifically cleansing the wound makes it worse. 10/05/2017 -- the patient was seen by Dr. Lucky Cowboy last week and noninvasive studies showed a normal right ABI with brisk triphasic waveforms consistent with no arterial insufficiency including normal digital pressures. The duplex showed a patent distal right SFA stent and the proximal SFA was also normal. He was pleased with her test and thought she should have enough of perfusion for normal wound healing. He would see her back in 6 months time. 12/21/17 on evaluation today patient appears to be doing fairly well in regard to her right lateral ankle wound. Unfortunately the main issue that she is expansion at this point is that she is having some issues with what appears to be some cellulitis in the [name], [name]. ([id number]) right anterior shin. She has also been noting a little bit of uncomfortable feeling especially last night and her ankle area. I'm afraid that she made the developing a little bit of an infection. With that being said I think it is in the early stages. 12/28/17 on evaluation today patient's ankle appears to be doing excellent. She's making good progress at this point the cellulitis seems to have improved after last week's evaluation. Overall she is having no significant  discomfort which is excellent news. She does have an appointment with Dr. dew on March 29, 2018 for reevaluation in regard to the stent he placed. She seems to have excellent blood flow in the right lower extremity. 01/19/12 on evaluation today patient's wound  appears to be doing very well. In fact she does not appear to require debridement at this point, there's no evidence of infection, and overall from the standpoint of the wound she seems to be doing very well. With that being said I believe that it may be time to switch to different dressing away from the [facility] Dressing she tells me she does have a lot going on her friend actually passed away yesterday and she's also having a lot of issues with her husband this obviously is weighing heavy on her as far as your thoughts and concerns today. 01/25/18 on evaluation today patient appears to be doing fairly well in regard to her right lateral malleolus. She has been tolerating the dressing changes without complication. Overall I feel like this is definitely showing signs of improvement as far as how the overall appearance of the wound is there's also evidence of epithelium start to migrate over the granulation tissue. In general I think that she is progressing nicely as far as the wound is concerned. The only concern she really has is whether or not we can switch to every other week visits in order to avoid having as many appointments as her daughters have a difficult time getting her to her appointments as well as the patient's husband to his he is not doing very well at this point. 02/22/18 on evaluation today patient's right lateral malleolus ulcer appears to be doing great. She has been tolerating the dressing changes without complication. Overall you making excellent progress at this time. Patient is having no significant discomfort. 03/15/18 on evaluation today patient appears to be doing much more poorly in regard to her right lateral  ankle ulcer at this point. Unfortunately since have last seen her her husband has passed just a few days ago is obviously weighed heavily on her her daughter also had surgery well she is with her today as usual. There does not appear to be any evidence of infection she does seem to have significant contusion/deep tissue injury to the right lateral malleolus which was not noted previous when I saw her last. It's hard to tell of exactly when this injury occurred although during the time she was spending the night in the hospital this may have been most likely. 03/22/18 on evaluation today patient appears to actually be doing very well in regard to her ulcer. She did unfortunately have a setback which was noted last week however the good news is we seem to be getting back on track and in fact the wound in the core did still have some necrotic tissue which will be addressed at this point today but in general I'm seeing signs that things are on the up and up. She is glad to hear this obviously she's been somewhat concerned that due to the how her wound digressed more recently. 03/29/18 on evaluation today patient appears to be doing fairly well in regard to her right lower extremity lateral malleolus ulcer. She unfortunately does have a new area of pressure injury over the inferior portion where the wound has opened up a little bit larger secondary to the pressure she seems to be getting. She does tell me sometimes when she sleeps at night that it actually hurts and does seem to be pushing on the area little bit more unfortunately. There does not appear to be any evidence of infection which is good news. She has been tolerating the dressing changes without complication. She also did have some bruising in  the left second and third toes due to the fact that she may have bump this or injured it although she has neuropathy so she does not feel she did move recently that may have been where this came from.  Nonetheless there does not appear to be any evidence of infection at this time. Electronic Signature(s) Signed: 03/29/2018 5:37:52 PM By: Worthy Keeler PA-C Entered By: Worthy Keeler on 03/29/2018 13:17:30 Collyer, [name] ([id number]) -------------------------------------------------------------------------------- Physical Exam Details Patient Name: [name] Date of Service: 03/29/2018 12:30 PM Medical Record Number: [id number] Patient Account Number: [id number] Date of Birth/Sex: 1923/07/12 (84 y.o. F) Treating RN: Montey Hora Primary Care Provider: FITZGERALD, DAVID Other Clinician: Referring Provider: FITZGERALD, DAVID Treating Provider/Extender: STONE III, Taiki Buckwalter Weeks in Treatment: 40 Constitutional Well-nourished and well-hydrated in no acute distress. Respiratory normal breathing without difficulty. Psychiatric this patient is able to make decisions and demonstrates good insight into disease process. Alert and Oriented x 3. pleasant and cooperative. Notes Patient's wound on evaluation today does show some Slough noted in the core of the wound and there is some additional pressure injury noted over the inferior portion of the lateral malleolus wound. She's been tolerating the dressing changes without complication. Patient's wound did require sharp debridement today which was performed without complication she had no discomfort and post debridement the wound bed did appear to be doing better this is still slow progressing compared to what it was previous. Electronic Signature(s) Signed: 03/29/2018 5:37:52 PM By: Worthy Keeler PA-C Entered By: Worthy Keeler on 03/29/2018 13:18:24 Amborn, [name] ([id number]) -------------------------------------------------------------------------------- Physician Orders Details Patient Name: [name] Date of Service: 03/29/2018 12:30 PM Medical Record Number: [id number] Patient Account Number: [id number] Date  of Birth/Sex: [birth year] (84 y.o. 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 o Cleanse wound with mild soap and water o May Shower, gently pat wound dry prior to applying new dressing. Anesthetic (add to Medication List) Wound #1 Right,Lateral Malleolus o Topical Lidocaine 4% cream applied to wound bed prior to debridement (In Clinic Only). Primary Wound Dressing Wound #1 Right,Lateral Malleolus o Silver Collagen Secondary Dressing Wound #1 Right,Lateral Malleolus o Boardered Foam Dressing o Drawtex Dressing Change Frequency Wound #1 Right,Lateral Malleolus o Change dressing every day. Follow-up Appointments Wound #1 Right,Lateral Malleolus o Return Appointment in 1 week. Edema Control Wound #1 Right,Lateral Malleolus o Elevate legs to the level of the heart and pump ankles as often as possible Off-Loading Wound #1 Right,Lateral Malleolus Spearing, [name] ([id number]) o Turn and reposition every 2 hours o Other: - do not put pressure on your right ankle Additional Orders / Instructions Wound #1 Right,Lateral Malleolus o Increase protein intake. o Other: - Add Vitamin C, Zinc, Vitamin A to your diet Electronic Signature(s) Signed: 03/29/2018 4:37:07 PM By: Montey Hora Signed: 03/29/2018 5:37:52 PM By: Worthy Keeler PA-C Entered By: Montey Hora on 03/29/2018 13:12:57 Chuck, [name]  ([id number]) -------------------------------------------------------------------------------- Problem List Details Patient Name: [name] Date of Service: 03/29/2018 12:30 PM Medical Record Number: [id number] Patient Account Number: [id number] Date of Birth/Sex:  10/17/1923 (84 y.o. F) Treating RN: Montey Hora Primary Care Provider: FITZGERALD, DAVID Other Clinician: Referring Provider: FITZGERALD, DAVID Treating Provider/Extender: STONE III, Inocente Krach Weeks in Treatment: 29 Active Problems ICD-10 Impacting Encounter Code Description Active Date Wound Healing Diagnosis L97.312 Non-pressure chronic ulcer of right ankle with fat layer 08/10/2017 Yes exposed I70.233 Atherosclerosis of native arteries of right leg with ulceration of 08/10/2017 Yes ankle I89.0 Lymphedema, not elsewhere classified 08/10/2017 Yes B35.4 Tinea corporis 09/28/2017 Yes Inactive Problems Resolved Problems Electronic Signature(s) Signed: 03/29/2018 5:37:52 PM By: Worthy Keeler PA-C Entered By: Worthy Keeler on 03/29/2018 12:42:58 Deese, [name] ([id number]) -------------------------------------------------------------------------------- Progress Note Details Patient Name: [name] Date of Service: 03/29/2018 12:30 PM Medical Record Number: [id number] Patient Account Number: [id number] Date of Birth/Sex: 1923-10-04 (84 y.o. 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 Dr. Amalia Hailey the podiatrist saw her on 07/20/2017 and sent her to the wound center for possible hyperbaric oxygen therapy. past medical history of peripheral vascular disease, varicose veins, status post appendectomy, basal cell carcinoma excision from the left leg, cholecystectomy, pacemaker placement, right lower extremity angiography done by Dr. dew in March 2017 with placement of a stent. there is also note of a successful ablation of the right small saphenous vein done which was reviewed by ultrasound on 10/24/2016. the patient had a right small saphenous vein ablation done on 10/20/2016. The patient has never been a smoker. She has been seen by Dr. Corene Cornea dew the vascular surgeon who most recently saw her on 06/15/2017 for evaluation of ongoing problems with right leg swelling. She had a lower extremity arterial duplex examination done(02/13/17) which showed patent distal right superficial femoral artery stent and above-the-knee popliteal stent without evidence of restenosis. The ABI was more than 1.3 on the right and more than 1.3 on the left. This was consistent with noncompressible arteries due to medial calcification. The right great toe pressure and PPG waveforms are within normal limits and the left great toe pressure and PPG waveforms are decreased. he recommended she continue to wear her compression stockings and continue with elevation. She is scheduled to have a noninvasive arterial study in the near future 08/16/2017 -- had a lower extremity arterial duplex examination done which showed patent distal right superficial femoral artery stent and above-the-knee popliteal stent without evidence of restenosis. The ABI was more than 1.3 on the right and more than 1.3 on the left. This was consistent with noncompressible arteries due to medial calcification. The right great toe pressure and PPG waveforms are within normal limits and the left great toe pressure and PPG  waveforms are decreased. the x-ray of the right ankle has not yet been done 08/24/2017 -- had a right ankle x-ray -- IMPRESSION:1. No fracture, bone lesion or evidence of osteomyelitis. 2. Lateral soft tissue swelling with a soft tissue ulcer. she has not yet seen the vascular surgeon for review 08/31/17 on evaluation today patient's wound appears to be showing signs of improvement. She still with her appointment with vascular in order to review her results  of her vascular study and then determine if any intervention would be recommended at that time. No fevers, chills, nausea, or vomiting noted at this time. She has been tolerating the dressing changes without complication. 09/28/17 on evaluation today patient's wound appears to show signs of good improvement in regard to the granulation tissue which is surfacing. There is still a layer of slough covering the wound and the posterior portion is still significantly deeper than the anterior nonetheless there has been some good sign of things moving towards the better. She is going to go back to Dr. dew for reevaluation to ensure her blood flow is still appropriate. That will be before her next evaluation with [place] next week. No fevers, chills, nausea, or vomiting noted at this time. Patient does have some discomfort rated to be a 3-4/10 depending on activity specifically cleansing the wound makes it worse. 10/05/2017 -- the patient was seen by Dr. Lucky Cowboy last week and noninvasive studies showed a normal right ABI with brisk [name], [name]. ([id number]) triphasic waveforms consistent with no arterial insufficiency including normal digital pressures. The duplex showed a patent distal right SFA stent and the proximal SFA was also normal. He was pleased with her test and thought she should have enough of perfusion for normal wound healing. He would see her back in 6 months time. 12/21/17 on evaluation today patient appears to be doing fairly well in regard  to her right lateral ankle wound. Unfortunately the main issue that she is expansion at this point is that she is having some issues with what appears to be some cellulitis in the right anterior shin. She has also been noting a little bit of uncomfortable feeling especially last night and her ankle area. I'm afraid that she made the developing a little bit of an infection. With that being said I think it is in the early stages. 12/28/17 on evaluation today patient's ankle appears to be doing excellent. She's making good progress at this point the cellulitis seems to have improved after last week's evaluation. Overall she is having no significant discomfort which is excellent news. She does have an appointment with Dr. dew on March 29, 2018 for reevaluation in regard to the stent he placed. She seems to have excellent blood flow in the right lower extremity. 01/19/12 on evaluation today patient's wound appears to be doing very well. In fact she does not appear to require debridement at this point, there's no evidence of infection, and overall from the standpoint of the wound she seems to be doing very well. With that being said I believe that it may be time to switch to different dressing away from the [facility] Dressing she tells me she does have a lot going on her friend actually passed away yesterday and she's also having a lot of issues with her husband this obviously is weighing heavy on her as far as your thoughts and concerns today. 01/25/18 on evaluation today patient appears to be doing fairly well in regard to her right lateral malleolus. She has been tolerating the dressing changes without complication. Overall I feel like this is definitely showing signs of improvement as far as how the overall appearance of the wound is there's also evidence of epithelium start to migrate over the granulation tissue. In general I think that she is progressing nicely as far as the wound is concerned. The  only concern she really has is whether or not we can switch to every other week visits in  order to avoid having as many appointments as her daughters have a difficult time getting her to her appointments as well as the patient's husband to his he is not doing very well at this point. 02/22/18 on evaluation today patient's right lateral malleolus ulcer appears to be doing great. She has been tolerating the dressing changes without complication. Overall you making excellent progress at this time. Patient is having no significant discomfort. 03/15/18 on evaluation today patient appears to be doing much more poorly in regard to her right lateral ankle ulcer at this point. Unfortunately since have last seen her her husband has passed just a few days ago is obviously weighed heavily on her her daughter also had surgery well she is with her today as usual. There does not appear to be any evidence of infection she does seem to have significant contusion/deep tissue injury to the right lateral malleolus which was not noted previous when I saw her last. It's hard to tell of exactly when this injury occurred although during the time she was spending the night in the hospital this may have been most likely. 03/22/18 on evaluation today patient appears to actually be doing very well in regard to her ulcer. She did unfortunately have a setback which was noted last week however the good news is we seem to be getting back on track and in fact the wound in the core did still have some necrotic tissue which will be addressed at this point today but in general I'm seeing signs that things are on the up and up. She is glad to hear this obviously she's been somewhat concerned that due to the how her wound digressed more recently. 03/29/18 on evaluation today patient appears to be doing fairly well in regard to her right lower extremity lateral malleolus ulcer. She unfortunately does have a new area of pressure injury over  the inferior portion where the wound has opened up a little bit larger secondary to the pressure she seems to be getting. She does tell me sometimes when she sleeps at night that it actually hurts and does seem to be pushing on the area little bit more unfortunately. There does not appear to be any evidence of infection which is good news. She has been tolerating the dressing changes without complication. She also did have some bruising in the left second and third toes due to the fact that she may have bump this or injured it although she has neuropathy so she does not feel she did move recently that may have been where this came from. Nonetheless there does not appear to be any evidence of infection at this time. Patient History Information obtained from Patient. Family History Cancer - Father,Siblings, Heart Disease - Siblings, No family history of Diabetes, Hereditary Spherocytosis, Hypertension, Kidney Disease, Lung Disease, Seizures, Stroke, Radebaugh, [name] ([id number]) Thyroid Problems, Tuberculosis. Social History Never smoker, Marital Status - Married, Alcohol Use - Never, Drug Use - No History, Caffeine Use - Rarely. Review of Systems (ROS) Constitutional Symptoms (General Health) Denies complaints or symptoms of Fever, Chills. Respiratory The patient has no complaints or symptoms. Cardiovascular The patient has no complaints or symptoms. Psychiatric The patient has no complaints or symptoms. Objective Constitutional Well-nourished and well-hydrated in no acute distress. Vitals Time Taken: 12:47 PM, Height: 65 in, Weight: 154.3 lbs, BMI: 25.7, Temperature: 97.7 F, Pulse: 84 bpm, Respiratory Rate: 16 breaths/min, Blood Pressure: 132/69 mmHg. Respiratory normal breathing without difficulty. Psychiatric this patient is able to make  decisions and demonstrates good insight into disease process. Alert and Oriented x 3. pleasant and cooperative. General Notes: Patient's  wound on evaluation today does show some Slough noted in the core of the wound and there is some additional pressure injury noted over the inferior portion of the lateral malleolus wound. She's been tolerating the dressing changes without complication. Patient's wound did require sharp debridement today which was performed without complication she had no discomfort and post debridement the wound bed did appear to be doing better this is still slow progressing compared to what it was previous. Integumentary (Hair, Skin) Wound #1 status is Open. Original cause of wound was Gradually Appeared. The wound is located on the Right,Lateral Malleolus. The wound measures 2.2cm length x 2.5cm width x 0.2cm depth; 4.32cm^2 area and 0.864cm^3 volume. There is Fat Layer (Subcutaneous Tissue) Exposed exposed. There is no tunneling or undermining noted. There is a large amount of serosanguineous drainage noted. The wound margin is distinct with the outline attached to the wound base. There is no granulation within the wound bed. There is a large (67-100%) amount of necrotic tissue within the wound bed including Eschar and Adherent Slough. The periwound skin appearance exhibited: Excoriation, Maceration, Rubor. The periwound skin appearance did not exhibit: Callus, Crepitus, Induration, Rash, Scarring, Dry/Scaly, Atrophie Blanche, Cyanosis, Ecchymosis, Hemosiderin Staining, Mottled, Pallor, Erythema. Periwound temperature was noted as No Abnormality. The periwound has tenderness on palpation. 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 There was a Excisional Skin/Subcutaneous Tissue Debridement with a total area of 5.5 sq cm performed by STONE III, Kyndall Amero E., PA-C. With the following instrument(s): Curette. to remove Viable and Non-Viable tissue/material Material removed includes Subcutaneous Tissue, and Slough, Biofilm, Fibrin/Exudate, and Slough after achieving pain control using Lidocaine 4% Topical Solution. No specimens were taken. A time out was conducted at 13:07, prior to the start of the procedure. A Minimum amount of bleeding was controlled with Pressure. The procedure was tolerated well with a pain level of 0 throughout and a pain level of 0 following the procedure. Post Debridement Measurements: 2.2cm length x 2.5cm width x 0.3cm depth; 1.296cm^3 volume. Character of Wound/Ulcer Post Debridement is improved. Severity of Tissue Post Debridement is: Fat layer exposed. Post procedure Diagnosis Wound #1: Same as Pre-Procedure Plan Wound Cleansing: Wound #1 Right,Lateral Malleolus: Clean wound with Normal Saline. Cleanse wound with mild soap and water May Shower, gently pat wound dry prior to applying new dressing. Anesthetic (add to Medication List): Wound #1 Right,Lateral Malleolus: Topical Lidocaine 4% cream applied to wound bed prior to debridement (In Clinic Only). Primary Wound Dressing: Wound #1 Right,Lateral Malleolus: Silver Collagen Secondary Dressing: Wound #1 Right,Lateral Malleolus: Boardered Foam Dressing Drawtex Dressing Change Frequency: Wound #1 Right,Lateral Malleolus: Change dressing every day. Follow-up Appointments: Wound #1 Right,Lateral Malleolus: [name], [name] ([id number]) Return Appointment in 1 week. Edema Control: Wound #1 Right,Lateral Malleolus: Elevate legs to the level of the heart and pump ankles as often as possible Off-Loading: Wound #1 Right,Lateral Malleolus: Turn and reposition every 2 hours Other: - do not put pressure on  your right ankle Additional Orders / Instructions: Wound #1 Right,Lateral Malleolus:  Increase protein intake. Other: - Add Vitamin C, Zinc, Vitamin A to your diet I think a big reason that the wound is going so slowly is that she continues get pressure to the site and this is causing issues in that regard. We will see were things stand in two weeks time when we see her for reevaluation. Next week her daughter is going to be on vacation and therefore will not be here to transport her. We will see were things stand at that point. If anything worsens significantly we will need to see her back sooner. Please see above for specific wound care orders. We will see patient for re-evaluation in 1 week(s) here in the clinic. If anything worsens or changes patient will contact our office for additional recommendations. Electronic Signature(s) Signed: 03/29/2018 5:37:52 PM By: Worthy Keeler PA-C Entered By: Worthy Keeler on 03/29/2018 13:19:53 Gullickson, [name] ([id number]) -------------------------------------------------------------------------------- ROS/PFSH Details Patient Name: [name] Date of Service: 03/29/2018 12:30 PM Medical Record Number: [id number] Patient Account Number: [id number] Date of Birth/Sex: 07/11/1923 (84 y.o. F) Treating RN: Montey Hora Primary Care Provider: FITZGERALD, DAVID Other Clinician: Referring Provider: FITZGERALD, DAVID Treating Provider/Extender: STONE III, Jax Kentner Weeks in Treatment: 44 Information Obtained From Patient Wound History Do you currently have one or more open woundso Yes How many open wounds do you currently haveo 1 Approximately how long have you had your woundso 2 yrs How have you been treating your wound(s) until nowo mupirocin, soaking in epsom salt Has your wound(s) ever healed and then re-openedo No Have you had any lab work done in the past montho No Have you tested positive for an antibiotic resistant organism (MRSA, VRE)o  No Have you tested positive for osteomyelitis (bone infection)o No Have you had any tests for circulation on your legso Yes Who ordered the testo Dr. Lucky Cowboy [facility] test [facility] Constitutional Symptoms (General Health) Complaints and Symptoms: Negative for: Fever; Chills Eyes Medical History: Positive for: Cataracts - surgery Respiratory Complaints and Symptoms: No Complaints or Symptoms Cardiovascular Complaints and Symptoms: No Complaints or Symptoms Medical History: Positive for: Congestive Heart Failure; Hypertension Musculoskeletal Medical History: Positive for: Osteoarthritis Neurologic Medical History: Positive for: Neuropathy Oncologic [name], [name] ([id number]) Medical History: Negative for: Received Chemotherapy; Received Radiation Psychiatric Complaints and Symptoms: No Complaints or Symptoms HBO Extended History Items Eyes: Cataracts Immunizations Pneumococcal Vaccine: Received Pneumococcal Vaccination: Yes Implantable Devices Family and Social History Cancer: Yes - Father,Siblings; Diabetes: No; Heart Disease: Yes - Siblings; Hereditary Spherocytosis: No; Hypertension: No; Kidney Disease: No; Lung Disease: No; Seizures: No; Stroke: No; Thyroid Problems: No; Tuberculosis: No; Never smoker; Marital Status - Married; Alcohol Use: Never; Drug Use: No History; Caffeine Use: Rarely; Financial Concerns: No; Food, Clothing or Shelter Needs: No; Support System Lacking: No; Transportation Concerns: No; Advanced Directives: No; Patient does not want information on Advanced Directives; Do not resuscitate: No; Living Will: Yes (Not Provided); Medical Power of Attorney: No Physician Affirmation I have reviewed and agree with the above information. Electronic Signature(s) Signed: 03/29/2018 4:37:07 PM By: Montey Hora Signed: 03/29/2018 5:37:52 PM By: Worthy Keeler PA-C Entered By: Worthy Keeler on 03/29/2018 13:17:53 Kaneshiro, [name]  ([id number]) -------------------------------------------------------------------------------- SuperBill Details Patient Name: [name] Date of Service: 03/29/2018 Medical Record Number: [id number] Patient Account Number: [id number] Date of Birth/Sex: 25-Apr-1923 (84 y.o. 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 I89.0 Lymphedema, not elsewhere classified B35.4 Tinea corporis Facility Procedures CPT4 Code: [id number] Description: 54656 - DEB SUBQ TISSUE 20 SQ CM/< ICD-10 Diagnosis Description C12.751 Non-pressure chronic ulcer of right ankle with fat layer ex Modifier: posed Quantity: 1 Physician Procedures CPT4 Code: [id number] Description: 44967 - WC PHYS SUBQ TISS 20 SQ CM ICD-10 Diagnosis Description R91.638 Non-pressure chronic ulcer of right ankle with fat layer ex Modifier: posed Quantity: 1 Electronic Signature(s) Signed: 03/29/2018 5:37:52 PM By: Worthy Keeler PA-C Entered By: Worthy Keeler on 03/29/2018 13:20:08

## 2018-04-12 ENCOUNTER — Encounter: Payer: Medicare Other | Attending: Physician Assistant | Admitting: Physician Assistant

## 2018-04-12 ENCOUNTER — Other Ambulatory Visit
Admission: RE | Admit: 2018-04-12 | Discharge: 2018-04-12 | Disposition: A | Discharge status: Home / Self Care | Payer: Medicare Other | Source: Ambulatory Visit | Attending: Infectious Diseases | Admitting: Infectious Diseases

## 2018-04-12 DIAGNOSIS — L89520 Pressure ulcer of left ankle, unstageable: Secondary | ICD-10-CM | POA: Diagnosis not present

## 2018-04-12 DIAGNOSIS — I890 Lymphedema, not elsewhere classified: Secondary | ICD-10-CM | POA: Insufficient documentation

## 2018-04-12 DIAGNOSIS — L97312 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d: Secondary | ICD-10-CM | POA: Insufficient documentation

## 2018-04-12 DIAGNOSIS — I70233 Atherosclerosis of native arteries of right leg with ulceration of ankle: Secondary | ICD-10-CM | POA: Diagnosis present

## 2018-04-12 DIAGNOSIS — B999 Unspecified infectious disease: Secondary | ICD-10-CM | POA: Insufficient documentation

## 2018-04-12 DIAGNOSIS — B354 Tinea corporis: Secondary | ICD-10-CM | POA: Diagnosis not present

## 2018-04-15 LAB — AEROBIC CULTURE W GRAM STAIN (SUPERFICIAL SPECIMEN)

## 2018-04-15 LAB — AEROBIC CULTURE  (SUPERFICIAL SPECIMEN)

## 2018-04-15 NOTE — Progress Notes (Signed)
[name] ([id number]) Visit Report for 04/12/2018 Chief Complaint Document Details Patient Name: [name], [name]. Date of Service: 04/12/2018 11:15 AM Medical Record Number: [id number] Patient Account Number: [id number] Date of Birth/Sex: 07-May-1923 (84 y.o. Female) Treating RN: Primary Care Provider: FITZGERALD, DAVID Other Clinician: Referring Provider: FITZGERALD, DAVID Treating Provider/Extender: STONE III, HOYT Weeks in Treatment: 35 Information Obtained from: Patient Chief Complaint Patient presents to the wound care center for a consult due non healing wound to the right lateral malleolus Electronic Signature(s) Signed: 04/13/2018 10:27:37 AM By: Worthy Keeler PA-C Entered By: Worthy Keeler on 04/12/2018 11:27:43 [name], [name] ([id number]) -------------------------------------------------------------------------------- Debridement Details Patient Name: [name] Date of Service: 04/12/2018 11:15 AM Medical Record Number: [id number] Patient Account Number: [id number] Date of Birth/Sex: 06-Aug-1923 (84 y.o. Female) Treating RN: Montey Hora Primary Care Provider: FITZGERALD, DAVID Other Clinician: Referring Provider: FITZGERALD, DAVID Treating Provider/Extender: STONE III, HOYT Weeks in Treatment: 35 Debridement Performed for Wound #1 Right,Lateral Malleolus Assessment: Performed By: Physician STONE III, HOYT E., PA-C Debridement Type: Debridement Severity of Tissue Pre Fat layer exposed Debridement: Pre-procedure Verification/Time Yes - 11:48 Out Taken: Start Time: 11:48 Pain Control: Lidocaine 4% Topical Solution Total Area Debrided (L x W): 2 (cm) x 2 (cm) = 4 (cm) Tissue and other material Viable, Non-Viable, Slough, Subcutaneous, Fibrin/Exudate, Slough debrided: Level: Skin/Subcutaneous Tissue Debridement Description: Excisional Instrument: Curette Specimen: Swab, Number of Specimens Taken: 1 Bleeding: Minimum Hemostasis  Achieved: Pressure End Time: 11:51 Procedural Pain: 0 Post Procedural Pain: 0 Response to Treatment: Procedure was tolerated well Level of Consciousness: Awake and Alert Post Procedure Vitals: Temperature: 98.1 Pulse: 91 Respiratory Rate: 18 Blood Pressure: Systolic Blood Pressure: 034 Diastolic Blood Pressure: 70 Post Debridement Measurements of Total Wound Length: (cm) 2 Width: (cm) 2 Depth: (cm) 0.6 Volume: (cm) 1.885 Character of Wound/Ulcer Post Debridement: Improved Severity of Tissue Post Debridement: Fat layer exposed Post Procedure Diagnosis Same as Pre-procedure Electronic Signature(s) [name], [name] ([id number]) Signed: 04/12/2018 4:42:41 PM By: Montey Hora Signed: 04/13/2018 10:27:37 AM By: Worthy Keeler PA-C Entered By: Montey Hora on 04/12/2018 11:51:19 [name], [name] ([id number]) -------------------------------------------------------------------------------- HPI Details Patient Name: [name] Date of Service: 04/12/2018 11:15 AM Medical Record Number: [id number] Patient Account Number: [id number] Date of Birth/Sex: Dec 30, 1922 (84 y.o. 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 Dr. Amalia Hailey the podiatrist saw her on 07/20/2017 and sent her to the wound center for possible hyperbaric oxygen therapy. past medical history of peripheral vascular disease, varicose veins, status post appendectomy, basal cell carcinoma excision from the left leg, cholecystectomy, pacemaker placement, right lower extremity angiography done by Dr. dew in March 2017 with placement of a  stent. there is also note of a successful ablation of the right small saphenous vein done which was reviewed by ultrasound on 10/24/2016. the patient had a right small saphenous vein ablation done on 10/20/2016. The patient has never been a smoker. She has been seen by Dr. Corene Cornea dew the vascular surgeon who most recently saw her on 06/15/2017 for evaluation of ongoing problems with right leg swelling. She had a lower extremity arterial duplex examination done(02/13/17) which showed patent distal right superficial femoral artery stent and above-the-knee popliteal  stent without evidence of restenosis. The ABI was more than 1.3 on the right and more than 1.3 on the left. This was consistent with noncompressible arteries due to medial calcification. The right great toe pressure and PPG waveforms are within normal limits and the left great toe pressure and PPG waveforms are decreased. he recommended she continue to wear her compression stockings and continue with elevation. She is scheduled to have a noninvasive arterial study in the near future 08/16/2017 -- had a lower extremity arterial duplex examination done which showed patent distal right superficial femoral artery stent and above-the-knee popliteal stent without evidence of restenosis. The ABI was more than 1.3 on the right and more than 1.3 on the left. This was consistent with noncompressible arteries due to medial calcification. The right great toe pressure and PPG waveforms are within normal limits and the left great toe pressure and PPG waveforms are decreased. the x-ray of the right ankle has not yet been done 08/24/2017 -- had a right ankle x-ray -- IMPRESSION:1. No fracture, bone lesion or evidence of osteomyelitis. 2. Lateral soft tissue swelling with a soft tissue ulcer. she has not yet seen the vascular surgeon for review 08/31/17 on evaluation today patient's wound appears to be showing signs of improvement. She still with her  appointment with vascular in order to review her results of her vascular study and then determine if any intervention would be recommended at that time. No fevers, chills, nausea, or vomiting noted at this time. She has been tolerating the dressing changes without complication. 09/28/17 on evaluation today patient's wound appears to show signs of good improvement in regard to the granulation tissue which is surfacing. There is still a layer of slough covering the wound and the posterior portion is still significantly deeper than the anterior nonetheless there has been some good sign of things moving towards the better. She is going to go back to Dr. dew for reevaluation to ensure her blood flow is still appropriate. That will be before her next evaluation with [place] next week. No fevers, chills, nausea, or vomiting noted at this time. Patient does have some discomfort rated to be a 3-4/10 depending on activity specifically cleansing the wound makes it worse. 10/05/2017 -- the patient was seen by Dr. Lucky Cowboy last week and noninvasive studies showed a normal right ABI with brisk triphasic waveforms consistent with no arterial insufficiency including normal digital pressures. The duplex showed a patent distal right SFA stent and the proximal SFA was also normal. He was pleased with her test and thought she should have enough of perfusion for normal wound healing. He would see her back in 6 months time. 12/21/17 on evaluation today patient appears to be doing fairly well in regard to her right lateral ankle wound. Unfortunately the main issue that she is expansion at this point is that she is having some issues with what appears to be some cellulitis in the CHARLYN, VIALPANDO. ([id number]) right anterior shin. She has also been noting a little bit of uncomfortable feeling especially last night and her ankle area. I'm afraid that she made the developing a little bit of an infection. With that being said I think  it is in the early stages. 12/28/17 on evaluation today patient's ankle appears to be doing excellent. She's making good progress at this point the cellulitis seems to have improved after last week's evaluation. Overall she is having no significant discomfort which is excellent news. She does have an appointment with Dr.  dew on March 29, 2018 for reevaluation in regard to the stent he placed. She seems to have excellent blood flow in the right lower extremity. 01/19/12 on evaluation today patient's wound appears to be doing very well. In fact she does not appear to require debridement at this point, there's no evidence of infection, and overall from the standpoint of the wound she seems to be doing very well. With that being said I believe that it may be time to switch to different dressing away from the [facility] Dressing she tells me she does have a lot going on her friend actually passed away yesterday and she's also having a lot of issues with her husband this obviously is weighing heavy on her as far as your thoughts and concerns today. 01/25/18 on evaluation today patient appears to be doing fairly well in regard to her right lateral malleolus. She has been tolerating the dressing changes without complication. Overall I feel like this is definitely showing signs of improvement as far as how the overall appearance of the wound is there's also evidence of epithelium start to migrate over the granulation tissue. In general I think that she is progressing nicely as far as the wound is concerned. The only concern she really has is whether or not we can switch to every other week visits in order to avoid having as many appointments as her daughters have a difficult time getting her to her appointments as well as the patient's husband to his he is not doing very well at this point. 02/22/18 on evaluation today patient's right lateral malleolus ulcer appears to be doing great. She has been tolerating  the dressing changes without complication. Overall you making excellent progress at this time. Patient is having no significant discomfort. 03/15/18 on evaluation today patient appears to be doing much more poorly in regard to her right lateral ankle ulcer at this point. Unfortunately since have last seen her her husband has passed just a few days ago is obviously weighed heavily on her her daughter also had surgery well she is with her today as usual. There does not appear to be any evidence of infection she does seem to have significant contusion/deep tissue injury to the right lateral malleolus which was not noted previous when I saw her last. It's hard to tell of exactly when this injury occurred although during the time she was spending the night in the hospital this may have been most likely. 03/22/18 on evaluation today patient appears to actually be doing very well in regard to her ulcer. She did unfortunately have a setback which was noted last week however the good news is we seem to be getting back on track and in fact the wound in the core did still have some necrotic tissue which will be addressed at this point today but in general I'm seeing signs that things are on the up and up. She is glad to hear this obviously she's been somewhat concerned that due to the how her wound digressed more recently. 03/29/18 on evaluation today patient appears to be doing fairly well in regard to her right lower extremity lateral malleolus ulcer. She unfortunately does have a new area of pressure injury over the inferior portion where the wound has opened up a little bit larger secondary to the pressure she seems to be getting. She does tell me sometimes when she sleeps at night that it actually hurts and does seem to be pushing on the area little bit  more unfortunately. There does not appear to be any evidence of infection which is good news. She has been tolerating the dressing changes without  complication. She also did have some bruising in the left second and third toes due to the fact that she may have bump this or injured it although she has neuropathy so she does not feel she did move recently that may have been where this came from. Nonetheless there does not appear to be any evidence of infection at this time. 04/12/18 on evaluation today patient's wound on the right lateral ankle actually appears to be doing a little bit better with a lot of necrotic docking tissue centrally loosening up in clearing away. However she does have the beginnings of a deep tissue injury on the left lateral malleolus likely due to the fact we've been trying offload the right as much as we have. I think she may benefit from an assistive soft device to help with offloading and it looks like they're looking at one of the doughnut conditions that wraps around the lower leg to offload which I think will definitely do a good job. With that being said I think we definitely need to address this issue on the left before it becomes a wound. Patient is not having significant pain. Electronic Signature(s) Signed: 04/13/2018 10:27:37 AM By: Worthy Keeler PA-C Entered By: Worthy Keeler on 04/13/2018 09:36:11 [name], [name] ([id number]) [name], [name] ([id number]) -------------------------------------------------------------------------------- Physical Exam Details Patient Name: TYFFANI, FOGLESONG Date of Service: 04/12/2018 11:15 AM Medical Record Number: [id number] Patient Account Number: [id number] Date of Birth/Sex: [birth year] (84 y.o. Female) Treating RN: Primary Care Provider: FITZGERALD, DAVID Other Clinician: Referring Provider: FITZGERALD, DAVID Treating Provider/Extender: STONE III, HOYT Weeks in Treatment: 50 Constitutional Well-nourished and well-hydrated in no acute distress. Respiratory normal breathing without difficulty. Psychiatric this patient is able to make decisions and  demonstrates good insight into disease process. Alert and Oriented x 3. pleasant and cooperative. Notes Currently patient's wound on the right did require sharp debridement today and I was able to remove a lot of necrotic slough although not all during evaluation today. She tolerated this without significant pain which is good news. Electronic Signature(s) Signed: 04/13/2018 10:27:37 AM By: Worthy Keeler PA-C Entered By: Worthy Keeler on 04/13/2018 09:36:46 Monday, [name] ([id number]) -------------------------------------------------------------------------------- Physician Orders Details Patient Name: [name] Date of Service: 04/12/2018 11:15 AM Medical Record Number: [id number] Patient Account Number: [id number] Date of Birth/Sex: Jun 25, 1923 (84 y.o. 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 o Cleanse wound with mild soap and water o May Shower, gently pat wound dry prior to applying new dressing. Anesthetic (add to Medication List) Wound #1 Right,Lateral Malleolus o Topical Lidocaine 4% cream applied to wound bed prior to debridement (In Clinic Only). Primary Wound Dressing Wound #1 Right,Lateral Malleolus o Iodoflex Secondary Dressing Wound #1 Right,Lateral Malleolus o Boardered Foam Dressing o Drawtex Dressing Change Frequency Wound #1 Right,Lateral Malleolus o Change dressing every day. Follow-up  Appointments Wound #1 Right,Lateral Malleolus o Return Appointment in 1 week. Edema Control  Wound #1 Right,Lateral Malleolus o Elevate legs to the level of the heart and pump ankles as often as possible Off-Loading Wound #1 Right,Lateral Malleolus [name], [name] ([id number]) o Turn and reposition every 2 hours o Other: - do not put pressure on your right ankle Additional Orders / Instructions Wound #1 Right,Lateral Malleolus o Increase protein intake. o Other: - Add Vitamin C, Zinc, Vitamin A to your diet Laboratory o Bacteria identified in Wound by Culture (MICRO) oooo LOINC Code: 1448-1 EHUD Convenience Name: Wound culture routine Patient Medications Allergies: sulfa, metronidazole, monistat Notifications Medication Indication Start End doxycycline hyclate 04/12/2018 DOSE 1 - oral 100 mg capsule - 1 capsule oral taken 2 times a day for 10 days Notes Please protect both ankles from pressure Electronic Signature(s) Signed: 04/12/2018 5:02:08 PM By: Worthy Keeler PA-C Previous Signature: 04/12/2018 4:42:41 PM Version By: Montey Hora Entered By: Worthy Keeler on 04/12/2018 17:02:08 [name], [name] ([id number]) -------------------------------------------------------------------------------- Problem List Details Patient Name: [name] Date of Service: 04/12/2018 11:15 AM Medical Record Number: [id number] Patient Account Number: [id number] Date of Birth/Sex: [birth year] (84 y.o. Female) Treating RN: Primary Care Provider: FITZGERALD, DAVID Other Clinician: Referring Provider: FITZGERALD, DAVID Treating Provider/Extender: STONE III, HOYT Weeks in Treatment: 35 Active Problems ICD-10 Impacting Encounter Code Description Active Date Wound Healing Diagnosis L97.312 Non-pressure chronic ulcer of right ankle with fat layer 08/10/2017 Yes exposed I70.233 Atherosclerosis of native arteries of right leg with ulceration of 08/10/2017 Yes ankle I89.0  Lymphedema, not elsewhere classified 08/10/2017 Yes B35.4 Tinea corporis 09/28/2017 Yes Inactive Problems Resolved Problems Electronic Signature(s) Signed: 04/13/2018 10:27:37 AM By: Worthy Keeler PA-C Entered By: Worthy Keeler on 04/12/2018 11:27:36 [name], [name] ([id number]) -------------------------------------------------------------------------------- Progress Note Details Patient Name: [name] Date of Service: 04/12/2018 11:15 AM Medical Record Number: [id number] Patient Account Number: [id number] Date of Birth/Sex: Apr 18, 1923 (84 y.o. 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 Dr. Amalia Hailey the podiatrist saw her on 07/20/2017 and sent her to the wound center for possible hyperbaric oxygen therapy. past medical history of peripheral vascular disease, varicose veins, status post appendectomy, basal cell carcinoma excision from the left leg, cholecystectomy, pacemaker placement, right lower extremity angiography done by Dr. dew in March 2017 with placement of a stent. there is also note of a successful ablation of the right small saphenous vein done which was reviewed by ultrasound on 10/24/2016. the patient had a right small saphenous vein ablation done on 10/20/2016. The patient has never been a smoker. She has been seen by Dr. Corene Cornea dew the vascular surgeon who most recently saw her on 06/15/2017 for evaluation of ongoing problems with  right leg swelling. She had a lower extremity arterial duplex examination done(02/13/17) which showed patent distal right superficial femoral artery stent and above-the-knee popliteal stent without evidence of restenosis. The ABI was more than 1.3 on the right and more than 1.3 on the left. This was consistent with noncompressible arteries due to medial calcification. The right great toe pressure and PPG waveforms are within normal limits and the left  great toe pressure and PPG waveforms are decreased. he recommended she continue to wear her compression stockings and continue with elevation. She is scheduled to have a noninvasive arterial study in the near future 08/16/2017 -- had a lower extremity arterial duplex examination done which showed patent distal right superficial femoral artery stent and above-the-knee popliteal stent without evidence of restenosis. The ABI was more than 1.3 on the right and more than 1.3 on the left. This was consistent with noncompressible arteries due to medial calcification. The right great toe pressure and PPG waveforms are within normal limits and the left great toe pressure and PPG waveforms are decreased. the x-ray of the right ankle has not yet been done 08/24/2017 -- had a right ankle x-ray -- IMPRESSION:1. No fracture, bone lesion or evidence of osteomyelitis. 2. Lateral soft tissue swelling with a soft tissue ulcer. she has not yet seen the vascular surgeon for review 08/31/17 on evaluation today patient's wound appears to be showing signs of improvement. She still with her appointment with vascular in order to review her results of her vascular study and then determine if any intervention would be recommended at that time. No fevers, chills, nausea, or vomiting noted at this time. She has been tolerating the dressing changes without complication. 09/28/17 on evaluation today patient's wound appears to show signs of good improvement in regard to the  granulation tissue which is surfacing. There is still a layer of slough covering the wound and the posterior portion is still significantly deeper than the anterior nonetheless there has been some good sign of things moving towards the better. She is going to go back to Dr. dew for reevaluation to ensure her blood flow is still appropriate. That will be before her next evaluation with [place] next week. No fevers, chills, nausea, or vomiting noted at this time. Patient does have some discomfort rated to be a 3-4/10 depending on activity specifically cleansing the wound makes it worse. 10/05/2017 -- the patient was seen by Dr. Lucky Cowboy last week and noninvasive studies showed a normal right ABI with brisk SHATOYA, ROETS. ([id number]) triphasic waveforms consistent with no arterial insufficiency including normal digital pressures. The duplex showed a patent distal right SFA stent and the proximal SFA was also normal. He was pleased with her test and thought she should have enough of perfusion for normal wound healing. He would see her back in 6 months time. 12/21/17 on evaluation today patient appears to be doing fairly well in regard to her right lateral ankle wound. Unfortunately the main issue that she is expansion at this point is that she is having some issues with what appears to be some cellulitis in the right anterior shin. She has also been noting a little bit of uncomfortable feeling especially last night and her ankle area. I'm afraid that she made the developing a little bit of an infection. With that being said I think it is in the early stages. 12/28/17 on evaluation today patient's ankle appears to be doing excellent. She's making good progress at this point the cellulitis seems to have improved after last week's evaluation. Overall she is having no significant discomfort which is excellent news. She does have an appointment with Dr. dew on March 29, 2018 for reevaluation in regard to the stent  he placed. She seems to have excellent blood flow in the right lower extremity. 01/19/12 on evaluation today patient's wound appears to be doing very well. In fact she does not appear to require  debridement at this point, there's no evidence of infection, and overall from the standpoint of the wound she seems to be doing very well. With that being said I believe that it may be time to switch to different dressing away from the [facility] Dressing she tells me she does have a lot going on her friend actually passed away yesterday and she's also having a lot of issues with her husband this obviously is weighing heavy on her as far as your thoughts and concerns today. 01/25/18 on evaluation today patient appears to be doing fairly well in regard to her right lateral malleolus. She has been tolerating the dressing changes without complication. Overall I feel like this is definitely showing signs of improvement as far as how the overall appearance of the wound is there's also evidence of epithelium start to migrate over the granulation tissue. In general I think that she is progressing nicely as far as the wound is concerned. The only concern she really has is whether or not we can switch to every other week visits in order to avoid having as many appointments as her daughters have a difficult time getting her to her appointments as well as the patient's husband to his he is not doing very well at this point. 02/22/18 on evaluation today patient's right lateral malleolus ulcer appears to be doing great. She has been tolerating the dressing changes without complication. Overall you making excellent progress at this time. Patient is having no significant discomfort. 03/15/18 on evaluation today patient appears to be doing much more poorly in regard to her right lateral ankle ulcer at this point. Unfortunately since have last seen her her husband has passed just a few days ago is obviously weighed heavily  on her her daughter also had surgery well she is with her today as usual. There does not appear to be any evidence of infection she does seem to have significant contusion/deep tissue injury to the right lateral malleolus which was not noted previous when I saw her last. It's hard to tell of exactly when this injury occurred although during the time she was spending the night in the hospital this may have been most likely. 03/22/18 on evaluation today patient appears to actually be doing very well in regard to her ulcer. She did unfortunately have a setback which was noted last week however the good news is we seem to be getting back on track and in fact the wound in the core did still have some necrotic tissue which will be addressed at this point today but in general I'm seeing signs that things are on the up and up. She is glad to hear this obviously she's been somewhat concerned that due to the how her wound digressed more recently. 03/29/18 on evaluation today patient appears to be doing fairly well in regard to her right lower extremity lateral malleolus ulcer. She unfortunately does have a new area of pressure injury over the inferior portion where the wound has opened up a little bit larger secondary to the pressure she seems to be getting. She does tell me sometimes when she sleeps at night that it actually hurts and does seem to be pushing on the area little bit more unfortunately. There does not appear to be any evidence of infection which is good news. She has been tolerating the dressing changes without complication. She also did have some bruising in the left second and third toes due to the fact that she may have  bump this or injured it although she has neuropathy so she does not feel she did move recently that may have been where this came from. Nonetheless there does not appear to be any evidence of infection at this time. 04/12/18 on evaluation today patient's wound on the right  lateral ankle actually appears to be doing a little bit better with a lot of necrotic docking tissue centrally loosening up in clearing away. However she does have the beginnings of a deep tissue injury on the left lateral malleolus likely due to the fact we've been trying offload the right as much as we have. I think she may benefit from an assistive soft device to help with offloading and it looks like they're looking at one of the doughnut conditions that wraps around the lower leg to offload which I think will definitely do a good job. With that being said I think we definitely need to address this issue on the left before it becomes a wound. Patient is not having significant pain. 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 Family History Cancer - Father,Siblings, Heart Disease - Siblings, No family history of Diabetes, Hereditary Spherocytosis, Hypertension, Kidney Disease, Lung Disease, Seizures, Stroke, Thyroid Problems, Tuberculosis. Social History Never smoker, Marital Status - Married, Alcohol Use - Never, Drug Use - No History, Caffeine Use - Rarely. Review of Systems (ROS) Constitutional Symptoms (General Health) Denies complaints or symptoms of Fever, Chills. Respiratory The patient has no complaints or symptoms. Cardiovascular The patient has no complaints or symptoms. Psychiatric The patient has no complaints or symptoms. Objective Constitutional Well-nourished and well-hydrated in no acute distress. Vitals Time Taken: 11:19 AM, Height: 65 in, Weight: 154.3 lbs, BMI: 25.7, Temperature: 98.1 F, Pulse: 91 bpm, Respiratory Rate: 16 breaths/min, Blood Pressure: 120/70 mmHg. Respiratory normal breathing without difficulty. Psychiatric this patient is able to make decisions and demonstrates good insight into disease process. Alert and Oriented x 3. pleasant and cooperative. General Notes: Currently patient's wound on the right did  require sharp debridement today and I was able to remove a lot of necrotic slough although not all during evaluation today. She tolerated this without significant pain which is good news. Integumentary (Hair, Skin) Wound #1 status is Open. Original cause of wound was Gradually Appeared. The wound is located on the Right,Lateral Malleolus. The wound measures 2cm length x 2cm width x 0.5cm depth; 3.142cm^2 area and 1.571cm^3 volume. There is Fat Layer (Subcutaneous Tissue) Exposed exposed. There is no tunneling or undermining noted. There is a large amount of serosanguineous drainage noted. The wound margin is distinct with the outline attached to the wound base. There is no granulation within the wound bed. There is a large (67-100%) amount of necrotic tissue within the wound bed including Eschar and Adherent Slough. The periwound skin appearance exhibited: Excoriation, Maceration, Rubor, Erythema. The periwound skin appearance did not exhibit: Callus, Crepitus, Induration, Rash, Scarring, Dry/Scaly, Atrophie Blanche, Cyanosis, Ecchymosis, Hemosiderin Staining, Mottled, Pallor. The surrounding wound skin color is noted with erythema [name], [name] ([id number]) which is circumferential. Periwound temperature was noted as No Abnormality. The periwound has tenderness on palpation. 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 There was a Excisional Skin/Subcutaneous Tissue Debridement with a total area of 4 sq cm performed by STONE III, HOYT E., PA-C. With the following instrument(s): Curette to remove Viable and  Non-Viable tissue/material. Material removed includes Subcutaneous Tissue, Slough, and Fibrin/Exudate after achieving pain control using Lidocaine 4% Topical Solution. 1 specimen was taken by a Swab and sent to the lab per facility protocol. A time out was conducted at 11:48, prior to the start of the procedure. A Minimum amount of bleeding was controlled with Pressure. The procedure was tolerated well with a pain level of 0 throughout and a pain level of 0 following the procedure. Patient s Level of Consciousness post procedure was recorded as Awake and Alert and post-procedure vitals were taken including Temperature: 98.1 F, Pulse: 91 bpm, Respiratory Rate: 18 breaths/min, Blood Pressure: (120)/(70) mmHg. Post Debridement Measurements: 2cm length x 2cm width x 0.6cm depth; 1.885cm^3 volume. Character of Wound/Ulcer Post Debridement is improved. Severity of Tissue Post Debridement is: Fat layer exposed. Post procedure Diagnosis Wound #1: Same as Pre-Procedure Plan Wound Cleansing: Wound #1 Right,Lateral Malleolus: Clean wound with Normal Saline. Cleanse wound with mild soap and water May Shower, gently pat wound dry prior to applying new dressing. Anesthetic (add to Medication List): Wound #1 Right,Lateral Malleolus: Topical Lidocaine 4% cream applied to wound bed prior to debridement (In Clinic Only). Primary Wound Dressing: Wound #1 Right,Lateral Malleolus: Iodoflex Secondary Dressing: Wound #1 Right,Lateral Malleolus: Boardered Foam Dressing Drawtex [name], [name] ([id number]) Dressing Change Frequency: Wound #1 Right,Lateral Malleolus: Change dressing every day. Follow-up Appointments: Wound #1 Right,Lateral Malleolus: Return Appointment in 1 week. Edema Control: Wound #1 Right,Lateral Malleolus: Elevate legs to the level of the heart and pump ankles as often as possible Off-Loading: Wound #1 Right,Lateral Malleolus: Turn and reposition every 2 hours Other: - do not  put pressure on your right ankle Additional Orders / Instructions: Wound #1 Right,Lateral Malleolus: Increase protein intake. Other: - Add Vitamin C, Zinc, Vitamin A to your diet Laboratory ordered were: Wound culture routine The following medication(s) was prescribed: doxycycline hyclate oral 100 mg capsule 1 1 capsule oral taken 2 times a day for 10 days starting 04/12/2018 General Notes: Please protect both ankles from pressure Currently I am going to suggest that we go ahead and switch the dressing to Iodoflex which I think will be of help as far as cleaning out the wound. She's in agreement with plan. We will see her for reevaluation in one weeks time. Please see above for specific wound care orders. We will see patient for re-evaluation in 1 week(s) here in the clinic. If anything worsens or changes patient will contact our office for additional recommendations. Electronic Signature(s) Signed: 04/13/2018 10:27:37 AM By: Worthy Keeler PA-C Entered By: Worthy Keeler on 04/13/2018 09:37:24 [name], [name] ([id number]) -------------------------------------------------------------------------------- ROS/PFSH Details Patient Name: [name] Date of Service: 04/12/2018 11:15 AM Medical Record Number: [id number] Patient Account Number: [id number] Date of Birth/Sex: 05-22-1923 (84 y.o. Female) Treating RN: Primary Care Provider: FITZGERALD, DAVID Other Clinician: Referring Provider: FITZGERALD, DAVID Treating Provider/Extender: STONE III, HOYT Weeks in Treatment: 35 Information Obtained From Patient Wound History Do you currently have one or more open woundso Yes How many open wounds do you currently haveo 1 Approximately how long have you had your woundso 2 yrs How have you been treating your wound(s) until nowo mupirocin, soaking in epsom salt Has your wound(s) ever healed and then re-openedo No Have you had any lab work  done in the past montho No Have you tested  positive for an antibiotic resistant organism (MRSA, VRE)o No Have you tested positive for osteomyelitis (bone infection)o No Have you had any tests for circulation on your legso Yes Who ordered the testo Dr. Lucky Cowboy [facility] test [facility] Constitutional Symptoms (General Health) Complaints and Symptoms: Negative for: Fever; Chills Eyes Medical History: Positive for: Cataracts - surgery Respiratory Complaints and Symptoms: No Complaints or Symptoms Cardiovascular Complaints and Symptoms: No Complaints or Symptoms Medical History: Positive for: Congestive Heart Failure; Hypertension Musculoskeletal Medical History: Positive for: Osteoarthritis Neurologic Medical History: Positive for: Neuropathy Oncologic NOVALIE, LEAMY ([id number]) Medical History: Negative for: Received Chemotherapy; Received Radiation Psychiatric Complaints and Symptoms: No Complaints or Symptoms HBO Extended History Items Eyes: Cataracts Immunizations Pneumococcal Vaccine: Received Pneumococcal Vaccination: Yes Implantable Devices Family and Social History Cancer: Yes - Father,Siblings; Diabetes: No; Heart Disease: Yes - Siblings; Hereditary Spherocytosis: No; Hypertension: No; Kidney Disease: No; Lung Disease: No; Seizures: No; Stroke: No; Thyroid Problems: No; Tuberculosis: No; Never smoker; Marital Status - Married; Alcohol Use: Never; Drug Use: No History; Caffeine Use: Rarely; Financial Concerns: No; Food, Clothing or Shelter Needs: No; Support System Lacking: No; Transportation Concerns: No; Advanced Directives: No; Patient does not want information on Advanced Directives; Do not resuscitate: No; Living Will: Yes (Not Provided); Medical Power of Attorney: No Physician Affirmation I have reviewed and agree with the above information. Electronic Signature(s) Signed: 04/13/2018 10:27:37 AM By: Worthy Keeler PA-C Entered By: Worthy Keeler on 04/13/2018 09:36:33 [name], [name]  ([id number]) -------------------------------------------------------------------------------- SuperBill Details Patient Name: [name] Date of Service: 04/12/2018 Medical Record Number: [id number] Patient Account Number: [id number] Date of Birth/Sex: Oct 28, 1923 (84 y.o. 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 I89.0 Lymphedema, not elsewhere classified B35.4 Tinea corporis Facility Procedures CPT4 Code: [id number] Description: 94174 - DEB SUBQ TISSUE 20 SQ CM/< ICD-10 Diagnosis Description Y81.448 Non-pressure chronic ulcer of right ankle with fat layer ex Modifier: posed Quantity: 1 Physician Procedures CPT4 Code: [id number] Description: 97026 - WC PHYS SUBQ TISS 20 SQ CM ICD-10 Diagnosis Description V78.588 Non-pressure chronic ulcer of right ankle with fat layer ex Modifier: posed Quantity: 1 Electronic Signature(s) Signed: 04/13/2018 10:27:37 AM By: Worthy Keeler PA-C Entered By: Worthy Keeler on 04/13/2018 09:37:32

## 2018-04-19 ENCOUNTER — Encounter: Payer: Medicare Other | Admitting: Physician Assistant

## 2018-04-19 DIAGNOSIS — I70233 Atherosclerosis of native arteries of right leg with ulceration of ankle: Secondary | ICD-10-CM | POA: Diagnosis not present

## 2018-04-21 NOTE — Progress Notes (Signed)
MARIETTE, COWLEY ([id number]) Visit Report for 04/19/2018 Chief Complaint Document Details Patient Name: [name], [name]. Date of Service: 04/19/2018 11:00 AM Medical Record Number: [id number] Patient Account Number: [id number] Date of Birth/Sex: [birth year] (84 y.o. F) Treating RN: Montey Hora Primary Care Provider: FITZGERALD, DAVID Other Clinician: Referring Provider: FITZGERALD, DAVID Treating Provider/Extender: STONE III, HOYT Weeks in Treatment: 41 Information Obtained from: Patient Chief Complaint Patient presents to the wound care center for a consult due non healing wound to the right lateral malleolus Electronic Signature(s) Signed: 04/20/2018 1:18:07 PM By: Worthy Keeler PA-C Entered By: Worthy Keeler on 04/19/2018 11:33:52 [name], [name] ([id number]) -------------------------------------------------------------------------------- Debridement Details Patient Name: [name] Date of Service: 04/19/2018 11:00 AM Medical Record Number: [id number] Patient Account Number: [id number] Date of Birth/Sex: September 17, 1923 (84 y.o. F) Treating RN: Montey Hora Primary Care Provider: FITZGERALD, DAVID Other Clinician: Referring Provider: FITZGERALD, DAVID Treating Provider/Extender: STONE III, HOYT Weeks in Treatment: 36 Debridement Performed for Wound #1 Right,Lateral Malleolus Assessment: Performed By: Physician STONE III, HOYT E., PA-C Debridement Type: Debridement Severity of Tissue Pre Fat layer exposed Debridement: Pre-procedure Verification/Time Yes - 11:39 Out Taken: Start Time: 11:39 Pain Control: Lidocaine 4% Topical Solution Total Area Debrided (L x W): 2 (cm) x 2 (cm) = 4 (cm) Tissue and other material Viable, Non-Viable, Slough, Subcutaneous, Fibrin/Exudate, Slough debrided: Level: Skin/Subcutaneous Tissue Debridement Description: Excisional Instrument: Curette Bleeding: Minimum Hemostasis Achieved: Pressure End Time: 11:43 Procedural  Pain: 0 Post Procedural Pain: 0 Response to Treatment: Procedure was tolerated well Level of Consciousness: Awake and Alert Post Procedure Vitals: Temperature: 98.0 Pulse: 86 Respiratory Rate: 16 Blood Pressure: Systolic Blood Pressure: 998 Diastolic Blood Pressure: 72 Post Debridement Measurements of Total Wound Length: (cm) 2 Width: (cm) 2 Depth: (cm) 0.6 Volume: (cm) 1.885 Character of Wound/Ulcer Post Debridement: Improved Severity of Tissue Post Debridement: Fat layer exposed Post Procedure Diagnosis Same as Pre-procedure Electronic Signature(s) Signed: 04/19/2018 4:56:04 PM By: Montey Hora Signed: 04/20/2018 1:18:07 PM By: Sherene Sires, [name] ([id number]) Entered By: Montey Hora on 04/19/2018 11:42:52 [name], [name] ([id number]) -------------------------------------------------------------------------------- HPI Details Patient Name: [name] Date of Service: 04/19/2018 11:00 AM Medical Record Number: [id number] Patient Account Number: [id number] Date of Birth/Sex: Oct 06, 1923 (84 y.o. 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 Dr. Amalia Hailey the podiatrist saw her on 07/20/2017 and sent her to the wound center for possible hyperbaric oxygen therapy. past medical history of peripheral vascular disease, varicose veins, status post appendectomy, basal cell carcinoma excision from the left leg, cholecystectomy, pacemaker placement, right lower extremity angiography done by Dr. dew in March 2017 with placement of a stent. there is also note of a  successful ablation of the right small saphenous vein done which was reviewed by ultrasound on 10/24/2016. the patient had a right small saphenous vein ablation done on 10/20/2016. The patient has never been a smoker. She has been seen by Dr. Corene Cornea dew the vascular surgeon who most recently saw her on 06/15/2017 for evaluation of ongoing problems with right leg swelling. She had a lower extremity arterial duplex examination done(02/13/17) which showed patent distal right superficial femoral artery stent and above-the-knee popliteal stent without evidence  of restenosis. The ABI was more than 1.3 on the right and more than 1.3 on the left. This was consistent with noncompressible arteries due to medial calcification. The right great toe pressure and PPG waveforms are within normal limits and the left great toe pressure and PPG waveforms are decreased. he recommended she continue to wear her compression stockings and continue with elevation. She is scheduled to have a noninvasive arterial study in the near future 08/16/2017 -- had a lower extremity arterial duplex examination done which showed patent distal right superficial femoral artery stent and above-the-knee popliteal stent without evidence of restenosis. The ABI was more than 1.3 on the right and more than 1.3 on the left. This was consistent with noncompressible arteries due to medial calcification. The right great toe pressure and PPG waveforms are within normal limits and the left great toe pressure and PPG waveforms are decreased. the x-ray of the right ankle has not yet been done 08/24/2017 -- had a right ankle x-ray -- IMPRESSION:1. No fracture, bone lesion or evidence of osteomyelitis. 2. Lateral soft tissue swelling with a soft tissue ulcer. she has not yet seen the vascular surgeon for review 08/31/17 on evaluation today patient's wound appears to be showing signs of improvement. She still with her appointment with vascular in order to  review her results of her vascular study and then determine if any intervention would be recommended at that time. No fevers, chills, nausea, or vomiting noted at this time. She has been tolerating the dressing changes without complication. 09/28/17 on evaluation today patient's wound appears to show signs of good improvement in regard to the granulation tissue which is surfacing. There is still a layer of slough covering the wound and the posterior portion is still significantly deeper than the anterior nonetheless there has been some good sign of things moving towards the better. She is going to go back to Dr. dew for reevaluation to ensure her blood flow is still appropriate. That will be before her next evaluation with [place] next week. No fevers, chills, nausea, or vomiting noted at this time. Patient does have some discomfort rated to be a 3-4/10 depending on activity specifically cleansing the wound makes it worse. 10/05/2017 -- the patient was seen by Dr. Lucky Cowboy last week and noninvasive studies showed a normal right ABI with brisk triphasic waveforms consistent with no arterial insufficiency including normal digital pressures. The duplex showed a patent distal right SFA stent and the proximal SFA was also normal. He was pleased with her test and thought she should have enough of perfusion for normal wound healing. He would see her back in 6 months time. 12/21/17 on evaluation today patient appears to be doing fairly well in regard to her right lateral ankle wound. Unfortunately the main issue that she is expansion at this point is that she is having some issues with what appears to be some cellulitis in the LEIAH, GIANNOTTI. ([id number]) right anterior shin. She has also been noting a little bit of uncomfortable feeling especially last night and her ankle area. I'm afraid that she made the developing a little bit of an infection. With that being said I think it is in the early stages. 12/28/17 on  evaluation today patient's ankle appears to be doing excellent. She's making good progress at this point the cellulitis seems to have improved after last week's evaluation. Overall she is having no significant discomfort which is excellent news. She does have an appointment with Dr. dew on March 29, 2018 for reevaluation in regard to the stent he placed. She seems to have excellent blood flow in the right lower extremity. 01/19/12 on evaluation today patient's wound appears to be doing very well. In fact she does not appear to require debridement at this point, there's no evidence of infection, and overall from the standpoint of the wound she seems to be doing very well. With that being said I believe that it may be time to switch to different dressing away from the [facility] Dressing she tells me she does have a lot going on her friend actually passed away yesterday and she's also having a lot of issues with her husband this obviously is weighing heavy on her as far as your thoughts and concerns today. 01/25/18 on evaluation today patient appears to be doing fairly well in regard to her right lateral malleolus. She has been tolerating the dressing changes without complication. Overall I feel like this is definitely showing signs of improvement as far as how the overall appearance of the wound is there's also evidence of epithelium start to migrate over the granulation tissue. In general I think that she is progressing nicely as far as the wound is concerned. The only concern she really has is whether or not we can switch to every other week visits in order to avoid having as many appointments as her daughters have a difficult time getting her to her appointments as well as the patient's husband to his he is not doing very well at this point. 02/22/18 on evaluation today patient's right lateral malleolus ulcer appears to be doing great. She has been tolerating the dressing changes without  complication. Overall you making excellent progress at this time. Patient is having no significant discomfort. 03/15/18 on evaluation today patient appears to be doing much more poorly in regard to her right lateral ankle ulcer at this point. Unfortunately since have last seen her her husband has passed just a few days ago is obviously weighed heavily on her her daughter also had surgery well she is with her today as usual. There does not appear to be any evidence of infection she does seem to have significant contusion/deep tissue injury to the right lateral malleolus which was not noted previous when I saw her last. It's hard to tell of exactly when this injury occurred although during the time she was spending the night in the hospital this may have been most likely. 03/22/18 on evaluation today patient appears to actually be doing very well in regard to her ulcer. She did unfortunately have a setback which was noted last week however the good news is we seem to be getting back on track and in fact the wound in the core did still have some necrotic tissue which will be addressed at this point today but in general I'm seeing signs that things are on the up and up. She is glad to hear this obviously she's been somewhat concerned that due to the how her wound digressed more recently. 03/29/18 on evaluation today patient appears to be doing fairly well in regard to her right lower extremity lateral malleolus ulcer. She unfortunately does have a new area of pressure injury over the inferior portion where the wound has opened up a little bit larger secondary to the pressure she seems to be getting. She does tell me sometimes when she sleeps at night that it actually hurts and does seem to be pushing on the area little bit more unfortunately. There  does not appear to be any evidence of infection which is good news. She has been tolerating the dressing changes without complication. She also did have some  bruising in the left second and third toes due to the fact that she may have bump this or injured it although she has neuropathy so she does not feel she did move recently that may have been where this came from. Nonetheless there does not appear to be any evidence of infection at this time. 04/12/18 on evaluation today patient's wound on the right lateral ankle actually appears to be doing a little bit better with a lot of necrotic docking tissue centrally loosening up in clearing away. However she does have the beginnings of a deep tissue injury on the left lateral malleolus likely due to the fact we've been trying offload the right as much as we have. I think she may benefit from an assistive soft device to help with offloading and it looks like they're looking at one of the doughnut conditions that wraps around the lower leg to offload which I think will definitely do a good job. With that being said I think we definitely need to address this issue on the left before it becomes a wound. Patient is not having significant pain. 04/19/18 on evaluation today patient appears to be doing excellent in regard to the progress she's made with her right lateral ankle ulcer. The left ankle region which did show evidence of a deep tissue injury seems to be resolving there's little fluid noted underneath and a blister there's nothing open at this point in time overall I feel like this is progressing nicely which is good news. She does not seem to be having significant discomfort at this point which is also good news. BIJOU, EASLER ([id number]) Electronic Signature(s) Signed: 04/20/2018 1:18:07 PM By: Worthy Keeler PA-C Entered By: Worthy Keeler on 04/19/2018 13:00:20 [name], [name] ([id number]) -------------------------------------------------------------------------------- Physical Exam Details Patient Name: [name] Date of Service: 04/19/2018 11:00 AM Medical Record Number:  [id number] Patient Account Number: [id number] Date of Birth/Sex: 05/08/1923 (84 y.o. F) Treating RN: Montey Hora Primary Care Provider: FITZGERALD, DAVID Other Clinician: Referring Provider: FITZGERALD, DAVID Treating Provider/Extender: STONE III, HOYT Weeks in Treatment: 48 Constitutional Well-nourished and well-hydrated in no acute distress. Respiratory normal breathing without difficulty. Psychiatric this patient is able to make decisions and demonstrates good insight into disease process. Alert and Oriented x 3. pleasant and cooperative. Notes Currently she does have slough noted over the surface of the right lateral ankle wound I was able to debride away the majority of the slough during evaluation today. She tolerated this with no significant discomfort she said if anything there was just minimal pain noted. Fortunately there's a lot of good granulation although there still some adherent slough that will require such point treatment and likely serial debridement. Electronic Signature(s) Signed: 04/20/2018 1:18:07 PM By: Worthy Keeler PA-C Entered By: Worthy Keeler on 04/19/2018 13:00:58 [name], [name] ([id number]) -------------------------------------------------------------------------------- Physician Orders Details Patient Name: [name] Date of Service: 04/19/2018 11:00 AM Medical Record Number: [id number] Patient Account Number: [id number] Date of Birth/Sex: 04/30/1923 (84 y.o. 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 o Cleanse wound with mild soap and water o May Shower, gently pat wound dry prior to applying new dressing. Anesthetic (add to Medication List) Wound #1 Right,Lateral Malleolus o Topical Lidocaine 4% cream applied to wound bed prior to debridement (In Clinic Only). Primary Wound Dressing Wound #1 Right,Lateral Malleolus o Iodoflex Wound #2 Left,Lateral Malleolus o Other: - paint with betadine Secondary Dressing Wound #1 Right,Lateral Malleolus o Boardered Foam Dressing o Drawtex Wound #2 Left,Lateral Malleolus o Boardered Foam Dressing o Drawtex Dressing Change Frequency Wound #1 Right,Lateral Malleolus o Change dressing every day. Wound #2 Left,Lateral Malleolus o Change dressing every day. Follow-up Appointments ELDA, DUNKERSON ([id number]) Wound #1 Right,Lateral Malleolus o Return Appointment in 1 week. Wound #2 Left,Lateral Malleolus o Return Appointment in 1 week. Edema Control Wound #1 Right,Lateral Malleolus o Elevate legs to the level of the heart and pump ankles as often as possible Wound #2 Left,Lateral Malleolus o Elevate legs to the level of the heart and pump ankles as often as possible Off-Loading Wound #1 Right,Lateral Malleolus o Turn and reposition every 2 hours o Other: - do not put pressure on your right ankle Wound #2 Left,Lateral Malleolus o Turn and reposition every 2 hours o Other: - do not put pressure on your right ankle Additional Orders / Instructions Wound #1 Right,Lateral Malleolus o Increase protein intake. o Other: - Add Vitamin C, Zinc, Vitamin A to your diet Wound #2 Left,Lateral Malleolus o Increase protein intake. o Other: - Add Vitamin C, Zinc, Vitamin A to your diet Electronic Signature(s) Signed: 04/19/2018 4:56:04 PM By: Montey Hora Signed: 04/20/2018 1:18:07 PM By: Worthy Keeler  PA-C Entered By: Montey Hora on 04/19/2018 11:46:26 [name], [name] ([id number]) -------------------------------------------------------------------------------- Problem List Details Patient Name: [name] Date of Service: 04/19/2018 11:00 AM Medical Record Number: [id number] Patient Account Number: [id number] Date of Birth/Sex: [birth year] (84 y.o. F) Treating RN: Montey Hora Primary Care Provider: FITZGERALD, DAVID Other Clinician: Referring Provider: FITZGERALD, DAVID Treating Provider/Extender: STONE III, HOYT Weeks in Treatment: 74 Active Problems ICD-10 Impacting Encounter Code Description Active Date Wound Healing Diagnosis L97.312 Non-pressure chronic ulcer of right ankle with fat layer 08/10/2017 Yes exposed I70.233 Atherosclerosis of native arteries of right leg with ulceration of 08/10/2017 Yes ankle I89.0 Lymphedema, not elsewhere classified 08/10/2017 Yes B35.4 Tinea corporis 09/28/2017 Yes Inactive Problems Resolved Problems Electronic Signature(s) Signed: 04/20/2018 1:18:07 PM By: Worthy Keeler PA-C Entered By: Worthy Keeler on 04/19/2018 11:33:41 [name], [name] ([id number]) -------------------------------------------------------------------------------- Progress Note Details Patient Name: [name] Date of Service: 04/19/2018 11:00 AM Medical Record Number: [id number] Patient Account Number: [id number] Date of Birth/Sex: 1922/12/14 (84 y.o. 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 Dr. Amalia Hailey the podiatrist saw her on 07/20/2017 and sent her to the wound center for possible hyperbaric oxygen therapy. past medical history of peripheral vascular disease, varicose veins, status post appendectomy, basal cell carcinoma excision  from the left leg, cholecystectomy, pacemaker placement, right lower extremity angiography done by Dr. dew in March 2017 with placement of a stent. there is also note of a successful ablation of the right small saphenous vein done which was reviewed by ultrasound on 10/24/2016. the patient had a right small saphenous vein ablation done on 10/20/2016. The patient has never been a smoker. She has been seen by Dr. Corene Cornea dew the vascular surgeon who most recently saw her on 06/15/2017 for evaluation of ongoing problems with right leg swelling. She had a lower extremity arterial duplex examination done(02/13/17) which showed patent distal right superficial femoral artery stent and above-the-knee popliteal stent without evidence of restenosis. The ABI was more than 1.3 on the right and more than 1.3 on the left. This was consistent with noncompressible arteries due to medial calcification. The right great toe pressure and PPG waveforms are within normal limits and the left great toe pressure and PPG waveforms are decreased. he recommended she continue to wear her compression stockings and continue with elevation. She is scheduled to have a noninvasive arterial study in the near future 08/16/2017 -- had a lower extremity arterial duplex examination done which showed patent distal right superficial femoral artery stent and above-the-knee popliteal stent without evidence of restenosis. The ABI was more than 1.3 on the right and more than 1.3 on the left. This was consistent with noncompressible arteries due to medial calcification. The right great toe pressure and  PPG waveforms are within normal limits and the left great toe pressure and PPG waveforms are decreased. the x-ray of the right ankle has not yet been done 08/24/2017 -- had a right ankle x-ray -- IMPRESSION:1. No fracture, bone lesion or evidence of osteomyelitis. 2. Lateral soft tissue swelling with a soft tissue ulcer. she has not yet seen the vascular surgeon for review 08/31/17 on evaluation today patient's wound appears to be showing signs of improvement. She still with her appointment with vascular in order to review her results of her vascular study and then determine if any intervention would be recommended at that time. No fevers, chills, nausea, or vomiting noted at this time. She has been tolerating the dressing changes without complication. 09/28/17 on evaluation today patient's wound appears to show signs of good improvement in regard to the granulation tissue which is surfacing. There is still a layer of slough covering the wound and the posterior portion is still significantly deeper than the anterior nonetheless there has been some good sign of things moving towards the better. She is going to go back to Dr. dew for reevaluation to ensure her blood flow is still appropriate. That will be before her next evaluation with [place] next week. No fevers, chills, nausea, or vomiting noted at this time. Patient does have some discomfort rated to be a 3-4/10 depending on activity specifically cleansing the wound makes it worse. 10/05/2017 -- the patient was seen by Dr. Lucky Cowboy last week and noninvasive studies showed a normal right ABI with brisk CLAUDINE, STALLINGS. ([id number]) triphasic waveforms consistent with no arterial insufficiency including normal digital pressures. The duplex showed a patent distal right SFA stent and the proximal SFA was also normal. He was pleased with her test and thought she should have enough of perfusion for normal wound healing. He would see her back in 6 months  time. 12/21/17 on evaluation today patient appears to be doing fairly well in regard to her right lateral ankle wound. Unfortunately the main issue that she is  expansion at this point is that she is having some issues with what appears to be some cellulitis in the right anterior shin. She has also been noting a little bit of uncomfortable feeling especially last night and her ankle area. I'm afraid that she made the developing a little bit of an infection. With that being said I think it is in the early stages. 12/28/17 on evaluation today patient's ankle appears to be doing excellent. She's making good progress at this point the cellulitis seems to have improved after last week's evaluation. Overall she is having no significant discomfort which is excellent news. She does have an appointment with Dr. dew on March 29, 2018 for reevaluation in regard to the stent he placed. She seems to have excellent blood flow in the right lower extremity. 01/19/12 on evaluation today patient's wound appears to be doing very well. In fact she does not appear to require debridement at this point, there's no evidence of infection, and overall from the standpoint of the wound she seems to be doing very well. With that being said I believe that it may be time to switch to different dressing away from the [facility] Dressing she tells me she does have a lot going on her friend actually passed away yesterday and she's also having a lot of issues with her husband this obviously is weighing heavy on her as far as your thoughts and concerns today. 01/25/18 on evaluation today patient appears to be doing fairly well in regard to her right lateral malleolus. She has been tolerating the dressing changes without complication. Overall I feel like this is definitely showing signs of improvement as far as how the overall appearance of the wound is there's also evidence of epithelium start to migrate over the granulation tissue. In  general I think that she is progressing nicely as far as the wound is concerned. The only concern she really has is whether or not we can switch to every other week visits in order to avoid having as many appointments as her daughters have a difficult time getting her to her appointments as well as the patient's husband to his he is not doing very well at this point. 02/22/18 on evaluation today patient's right lateral malleolus ulcer appears to be doing great. She has been tolerating the dressing changes without complication. Overall you making excellent progress at this time. Patient is having no significant discomfort. 03/15/18 on evaluation today patient appears to be doing much more poorly in regard to her right lateral ankle ulcer at this point. Unfortunately since have last seen her her husband has passed just a few days ago is obviously weighed heavily on her her daughter also had surgery well she is with her today as usual. There does not appear to be any evidence of infection she does seem to have significant contusion/deep tissue injury to the right lateral malleolus which was not noted previous when I saw her last. It's hard to tell of exactly when this injury occurred although during the time she was spending the night in the hospital this may have been most likely. 03/22/18 on evaluation today patient appears to actually be doing very well in regard to her ulcer. She did unfortunately have a setback which was noted last week however the good news is we seem to be getting back on track and in fact the wound in the core did still have some necrotic tissue which will be addressed at this point today but in  general I'm seeing signs that things are on the up and up. She is glad to hear this obviously she's been somewhat concerned that due to the how her wound digressed more recently. 03/29/18 on evaluation today patient appears to be doing fairly well in regard to her right lower extremity  lateral malleolus ulcer. She unfortunately does have a new area of pressure injury over the inferior portion where the wound has opened up a little bit larger secondary to the pressure she seems to be getting. She does tell me sometimes when she sleeps at night that it actually hurts and does seem to be pushing on the area little bit more unfortunately. There does not appear to be any evidence of infection which is good news. She has been tolerating the dressing changes without complication. She also did have some bruising in the left second and third toes due to the fact that she may have bump this or injured it although she has neuropathy so she does not feel she did move recently that may have been where this came from. Nonetheless there does not appear to be any evidence of infection at this time. 04/12/18 on evaluation today patient's wound on the right lateral ankle actually appears to be doing a little bit better with a lot of necrotic docking tissue centrally loosening up in clearing away. However she does have the beginnings of a deep tissue injury on the left lateral malleolus likely due to the fact we've been trying offload the right as much as we have. I think she may benefit from an assistive soft device to help with offloading and it looks like they're looking at one of the doughnut conditions that wraps around the lower leg to offload which I think will definitely do a good job. With that being said I think we definitely need to address this issue on the left before it becomes a wound. Patient is not having significant pain. [name], [name] ([id number]) 04/19/18 on evaluation today patient appears to be doing excellent in regard to the progress she's made with her right lateral ankle ulcer. The left ankle region which did show evidence of a deep tissue injury seems to be resolving there's little fluid noted underneath and a blister there's nothing open at this point in time overall I  feel like this is progressing nicely which is good news. She does not seem to be having significant discomfort at this point which is also good news. Patient History Information obtained from Patient. Family History Cancer - Father,Siblings, Heart Disease - Siblings, No family history of Diabetes, Hereditary Spherocytosis, Hypertension, Kidney Disease, Lung Disease, Seizures, Stroke, Thyroid Problems, Tuberculosis. Social History Never smoker, Marital Status - Married, Alcohol Use - Never, Drug Use - No History, Caffeine Use - Rarely. Review of Systems (ROS) Constitutional Symptoms (General Health) Denies complaints or symptoms of Fever, Chills. Respiratory The patient has no complaints or symptoms. Cardiovascular The patient has no complaints or symptoms. Psychiatric The patient has no complaints or symptoms. Objective Constitutional Well-nourished and well-hydrated in no acute distress. Vitals Time Taken: 11:10 AM, Height: 65 in, Weight: 154.3 lbs, BMI: 25.7, Temperature: 98.0 F, Pulse: 86 bpm, Respiratory Rate: 16 breaths/min, Blood Pressure: 144/72 mmHg. Respiratory normal breathing without difficulty. Psychiatric this patient is able to make decisions and demonstrates good insight into disease process. Alert and Oriented x 3. pleasant and cooperative. General Notes: Currently she does have slough noted over the surface of the right lateral ankle wound I was able  to debride away the majority of the slough during evaluation today. She tolerated this with no significant discomfort she said if anything there was just minimal pain noted. Fortunately there's a lot of good granulation although there still some adherent slough that will require such point treatment and likely serial debridement. Integumentary (Hair, Skin) Wound #1 status is Open. Original cause of wound was Gradually Appeared. The wound is located on the Right,Lateral [name], [name]. ([id number]) Malleolus. The  wound measures 2cm length x 2cm width x 0.5cm depth; 3.142cm^2 area and 1.571cm^3 volume. There is Fat Layer (Subcutaneous Tissue) Exposed exposed. There is no tunneling or undermining noted. There is a medium amount of serosanguineous drainage noted. The wound margin is distinct with the outline attached to the wound base. There is no granulation within the wound bed. There is a large (67-100%) amount of necrotic tissue within the wound bed including Adherent Slough. The periwound skin appearance exhibited: Rubor, Erythema. The periwound skin appearance did not exhibit: Callus, Crepitus, Excoriation, Induration, Rash, Scarring, Dry/Scaly, Maceration, Atrophie Blanche, Cyanosis, Ecchymosis, Hemosiderin Staining, Mottled, Pallor. The surrounding wound skin color is noted with erythema which is circumferential. Periwound temperature was noted as No Abnormality. The periwound has tenderness on palpation. Wound #2 status is Open. Original cause of wound was Gradually Appeared. The wound is located on the Left,Lateral Malleolus. The wound measures 1cm length x 1cm width x 0.1cm depth; 0.785cm^2 area and 0.079cm^3 volume. The wound is limited to skin breakdown. There is no tunneling or undermining noted. There is a medium amount of serous drainage noted. The wound margin is flat and intact. There is no granulation within the wound bed. There is a large (67-100%) amount of necrotic tissue within the wound bed including Eschar. Assessment Active Problems ICD-10 L97.312 - 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d I70.233 - Atherosclerosis of native arteries of right leg with ulceration of ankle I89.0 - Lymphedema, not elsewhere classified B35.4 - Tinea corporis Procedures Wound #1 Pre-procedure diagnosis of Wound #1 is a Lymphedema located on the Right,Lateral Malleolus .Severity of Tissue Pre Debridement is: Fat layer exposed. There was a Excisional Skin/Subcutaneous Tissue  Debridement with a total area of 4 sq cm performed by STONE III, HOYT E., PA-C. With the following instrument(s): Curette to remove Viable and Non-Viable tissue/material. Material removed includes Subcutaneous Tissue, Slough, and Fibrin/Exudate after achieving pain control using Lidocaine 4% Topical Solution. No specimens were taken. A time out was conducted at 11:39, prior to the start of the procedure. A Minimum amount of bleeding was controlled with Pressure. The procedure was tolerated well with a pain level of 0 throughout and a pain level of 0 following the procedure. Patient s Level of Consciousness post procedure was recorded as Awake and Alert and post-procedure vitals were taken including Temperature: 98.0 F, Pulse: 86 bpm, Respiratory Rate: 16 breaths/min, Blood Pressure: (144)/(72) mmHg. Post Debridement Measurements: 2cm length x 2cm width x 0.6cm depth; 1.885cm^3 volume. Character of Wound/Ulcer Post Debridement is improved. Severity of Tissue Post Debridement is: Fat layer exposed. Post procedure Diagnosis Wound #1: Same as Pre-Procedure Plan Wound Cleansing: Wound #1 Right,Lateral Malleolus: Pina, [name] ([id number]) Clean wound with Normal Saline. Cleanse wound with mild soap and water May Shower, gently pat wound dry prior to applying new dressing. Anesthetic (add to Medication List): Wound #1 Right,Lateral Malleolus: Topical Lidocaine 4% cream applied to wound bed prior to debridement (In Clinic Only). Primary Wound Dressing: Wound #1 Right,Lateral Malleolus: Iodoflex Wound #2  Left,Lateral Malleolus: Other: - paint with betadine Secondary Dressing: Wound #1 Right,Lateral Malleolus: Boardered Foam Dressing Drawtex Wound #2 Left,Lateral Malleolus: Boardered Foam Dressing Drawtex Dressing Change Frequency: Wound #1 Right,Lateral Malleolus: Change dressing every day. Wound #2 Left,Lateral Malleolus: Change dressing every day. Follow-up Appointments: Wound  #1 Right,Lateral Malleolus: Return Appointment in 1 week. Wound #2 Left,Lateral Malleolus: Return Appointment in 1 week. Edema Control: Wound #1 Right,Lateral Malleolus: Elevate legs to the level of the heart and pump ankles as often as possible Wound #2 Left,Lateral Malleolus: Elevate legs to the level of the heart and pump ankles as often as possible Off-Loading: Wound #1 Right,Lateral Malleolus: Turn and reposition every 2 hours Other: - do not put pressure on your right ankle Wound #2 Left,Lateral Malleolus: Turn and reposition every 2 hours Other: - do not put pressure on your right ankle Additional Orders / Instructions: Wound #1 Right,Lateral Malleolus: Increase protein intake. Other: - Add Vitamin C, Zinc, Vitamin A to your diet Wound #2 Left,Lateral Malleolus: Increase protein intake. Other: - Add Vitamin C, Zinc, Vitamin A to your diet Currently she does have slough noted over the surface of the right lateral ankle wound I was able to debride away the majority of the slough during evaluation today. She tolerated this with no significant discomfort she said if anything there was just minimal pain noted. Fortunately there's a lot of good granulation although there still some adherent slough that will require such point treatment and likely serial debridement. Electronic Signature(s) Signed: 04/20/2018 1:18:07 PM By: Sherene Sires, [name] ([id number]) Entered By: Worthy Keeler on 04/19/2018 13:01:27 [name] ([id number]) -------------------------------------------------------------------------------- ROS/PFSH Details Patient Name: [name] Date of Service: 04/19/2018 11:00 AM Medical Record Number: [id number] Patient Account Number: [id number] Date of Birth/Sex: 10-26-1923 (84 y.o. F) Treating RN: Montey Hora Primary Care Provider: FITZGERALD, DAVID Other Clinician: Referring Provider: FITZGERALD, DAVID Treating  Provider/Extender: STONE III, HOYT Weeks in Treatment: 62 Information Obtained From Patient Wound History Do you currently have one or more open woundso Yes How many open wounds do you currently haveo 1 Approximately how long have you had your woundso 2 yrs How have you been treating your wound(s) until nowo mupirocin, soaking in epsom salt Has your wound(s) ever healed and then re-openedo No Have you had any lab work done in the past montho No Have you tested positive for an antibiotic resistant organism (MRSA, VRE)o No Have you tested positive for osteomyelitis (bone infection)o No Have you had any tests for circulation on your legso Yes Who ordered the testo Dr. Lucky Cowboy [facility] test [facility] Constitutional Symptoms (General Health) Complaints and Symptoms: Negative for: Fever; Chills Eyes Medical History: Positive for: Cataracts - surgery Respiratory Complaints and Symptoms: No Complaints or Symptoms Cardiovascular Complaints and Symptoms: No Complaints or Symptoms Medical History: Positive for: Congestive Heart Failure; Hypertension Musculoskeletal Medical History: Positive for: Osteoarthritis Neurologic Medical History: Positive for: Neuropathy Oncologic SOLEI, WUBBEN ([id number]) Medical History: Negative for: Received Chemotherapy; Received Radiation Psychiatric Complaints and Symptoms: No Complaints or Symptoms HBO Extended History Items Eyes: Cataracts Immunizations Pneumococcal Vaccine: Received Pneumococcal Vaccination: Yes Implantable Devices Family and Social History Cancer: Yes - Father,Siblings; Diabetes: No; Heart Disease: Yes - Siblings; Hereditary Spherocytosis: No; Hypertension: No; Kidney Disease: No; Lung Disease: No; Seizures: No; Stroke: No; Thyroid Problems: No; Tuberculosis: No; Never smoker; Marital Status - Married; Alcohol Use: Never; Drug Use: No History; Caffeine Use: Rarely; Financial Concerns: No; Food, Games developer or Shelter  Needs: No; Support System Lacking: No; Transportation Concerns: No; Advanced Directives: No; Patient does not want information on Advanced Directives; Do not resuscitate: No; Living Will: Yes (Not Provided); Medical Power of Attorney: No Physician Affirmation I have reviewed and agree with the above information. Electronic Signature(s) Signed: 04/19/2018 4:56:04 PM By: Montey Hora Signed: 04/20/2018 1:18:07 PM By: Worthy Keeler PA-C Entered By: Worthy Keeler on 04/19/2018 13:00:41 [name], [name] ([id number]) -------------------------------------------------------------------------------- SuperBill Details Patient Name: [name] Date of Service: 04/19/2018 Medical Record Number: [id number] Patient Account Number: [id number] Date of Birth/Sex: 05-Apr-1923 (84 y.o. 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 I89.0 Lymphedema, not elsewhere classified B35.4 Tinea corporis Facility Procedures CPT4 Code: [id number] Description: 38329 - DEB SUBQ TISSUE 20 SQ CM/< ICD-10 Diagnosis Description V91.660 Non-pressure chronic ulcer of right ankle with fat layer ex Modifier: posed Quantity: 1 Physician Procedures CPT4 Code: [id number] Description: 77414 - WC PHYS SUBQ TISS 20 SQ CM ICD-10 Diagnosis Description E39.532 Non-pressure chronic ulcer of right ankle with fat layer ex Modifier: posed Quantity: 1 Electronic Signature(s) Signed: 04/20/2018 1:18:07 PM By: Worthy Keeler PA-C Entered By: Worthy Keeler on 04/19/2018 13:01:37

## 2018-04-21 NOTE — Progress Notes (Signed)
[name], [name] ([id number]) Visit Report for 04/19/2018 Arrival Information Details Patient Name: [name], [name] Date of Service: 04/19/2018 11:00 AM Medical Record Number: [id number] Patient Account Number: [id number] Date of Birth/Sex: 05/20/1923 (84 y.o. F) Treating RN: Cornell Barman Primary Care Jamel Holzmann: FITZGERALD, DAVID Other Clinician: Referring Taeja Debellis: FITZGERALD, DAVID Treating Bettyjo Lundblad/Extender: STONE III, HOYT Weeks in Treatment: 67 Visit Information History Since Last Visit Added or deleted any medications: No Patient Arrived: Ambulatory Any new allergies or adverse reactions: No Arrival Time: 11:10 Had a fall or experienced change in No Accompanied By: daughter activities of daily living that may affect Transfer Assistance: None risk of falls: Patient Identification Verified: Yes Signs or symptoms of abuse/neglect since last visito No Secondary Verification Process Completed: Yes Hospitalized since last visit: No Patient Requires Transmission-Based No Implantable device outside of the clinic excluding No Precautions: cellular tissue based products placed in the center Patient Has Alerts: No since last visit: Has Dressing in Place as Prescribed: Yes Pain Present Now: No Electronic Signature(s) Signed: 04/19/2018 5:09:47 PM By: Gretta Cool, BSN, RN, CWS, Kim RN, BSN Entered By: Gretta Cool, BSN, RN, CWS, Kim on 04/19/2018 11:10:34 Purifoy, [name] ([id number]) -------------------------------------------------------------------------------- Encounter Discharge Information Details Patient Name: [name] Date of Service: 04/19/2018 11:00 AM Medical Record Number: [id number] Patient Account Number: [id number] Date of Birth/Sex: [birth year] (84 y.o. F) Treating RN: Ahmed Prima Primary Care Fay Swider: FITZGERALD, DAVID Other Clinician: Referring Eria Lozoya: FITZGERALD, DAVID Treating Dynisha Due/Extender: STONE III, HOYT Weeks in Treatment: 23 Encounter Discharge  Information Items Discharge Condition: Stable Ambulatory Status: Ambulatory Discharge Destination: Home Transportation: Private Auto Accompanied By: daughter Schedule Follow-up Appointment: Yes Clinical Summary of Care: Electronic Signature(s) Signed: 04/19/2018 1:46:14 PM By: Alric Quan Entered By: Alric Quan on 04/19/2018 13:46:14 Vandehei, [name] ([id number]) -------------------------------------------------------------------------------- Lower Extremity Assessment Details Patient Name: [name] Date of Service: 04/19/2018 11:00 AM Medical Record Number: [id number] Patient Account Number: [id number] Date of Birth/Sex: 02-01-1923 (84 y.o. F) Treating RN: Cornell Barman Primary Care Eliott Amparan: FITZGERALD, DAVID Other Clinician: Referring Harlym Gehling: FITZGERALD, DAVID Treating Ikea Demicco/Extender: STONE III, HOYT Weeks in Treatment: 36 Edema Assessment Assessed: [Left: No] [Right: No] Edema: [Left: Ye] [Right: s] Calf Left: Right: Point of Measurement: 32 cm From Medial Instep 32.5 cm 29.5 cm Ankle Left: Right: Point of Measurement: 10 cm From Medial Instep 20.5 cm 22.5 cm Vascular Assessment Pulses: Dorsalis Pedis Palpable: [Left:Yes] [Right:No] Doppler Audible: [Left:Yes] [Right:Yes] Posterior Tibial Palpable: [Left:Yes] [Right:No] Doppler Audible: [Left:Yes] [Right:Yes] Extremity colors, hair growth, and conditions: Extremity Color: [Left:Hyperpigmented] [Right:Red] Hair Growth on Extremity: [Left:No] [Right:No] Temperature of Extremity: [Left:Warm] [Right:Warm] Capillary Refill: [Left:< 3 seconds] [Right:< 3 seconds] Blood Pressure: Brachial: [Left:120] Dorsalis Pedis: 160 [Left:Dorsalis Pedis:] Ankle: Posterior Tibial: 180 [Left:Posterior Tibial: 1.50] Toe Nail Assessment Left: Right: Thick: Yes Yes Discolored: Yes Yes Deformed: Yes Yes Improper Length and Hygiene: No No Electronic Signature(s) Signed: 04/19/2018 5:09:47 PM By: Gretta Cool, BSN, RN,  CWS, Kim RN, BSN Entered By: Gretta Cool, BSN, RN, CWS, Kim on 04/19/2018 11:32:25 Horvath, [name] ([id number]) Joni Fears, [name] ([id number]) -------------------------------------------------------------------------------- Multi Wound Chart Details Patient Name: [name] Date of Service: 04/19/2018 11:00 AM Medical Record Number: [id number] Patient Account Number: [id number] Date of Birth/Sex: Aug 14, 1923 (84 y.o. F) Treating RN: Montey Hora Primary Care Natalye Kott: FITZGERALD, DAVID Other Clinician: Referring Corina Stacy: FITZGERALD, DAVID Treating Verlene Glantz/Extender: STONE III, HOYT Weeks in Treatment: 36 Vital Signs Height(in): 65 Pulse(bpm): 86 Weight(lbs): 154.3 Blood Pressure(mmHg): 144/72 Body Mass Index(BMI): 26 Temperature(F): 98.0 Respiratory Rate 16 (breaths/min): Photos: [1:No Photos] [2:No Photos] [  N/A:N/A] Wound Location: [1:Right Malleolus - Lateral] [2:Left Malleolus - Lateral] [N/A:N/A] Wounding Event: [1:Gradually Appeared] [2:Gradually Appeared] [N/A:N/A] Primary Etiology: [1:Lymphedema] [2:Pressure Ulcer] [N/A:N/A] Secondary Etiology: [1:Arterial Insufficiency Ulcer] [2:N/A] [N/A:N/A] Comorbid History: [1:Cataracts, Congestive Heart Failure, Hypertension, Osteoarthritis, Neuropathy] [2:Cataracts, Congestive Heart Failure, Hypertension, Osteoarthritis, Neuropathy] [N/A:N/A] Date Acquired: [1:08/11/2015] [2:04/12/2018] [N/A:N/A] Weeks of Treatment: [1:36] [2:0] [N/A:N/A] Wound Status: [1:Open] [2:Open] [N/A:N/A] Measurements L x W x D [1:2x2x0.5] [2:1x1x0.1] [N/A:N/A] (cm) Area (cm) : [1:3.142] [2:0.785] [N/A:N/A] Volume (cm) : [1:1.571] [2:0.079] [N/A:N/A] % Reduction in Area: [1:-105.10%] [2:0.00%] [N/A:N/A] % Reduction in Volume: [1:-242.30%] [2:0.00%] [N/A:N/A] Classification: [1:Full Thickness Without Exposed Support Structures] [2:Category/Stage II] [N/A:N/A] Exudate Amount: [1:Large] [2:None Present] [N/A:N/A] Exudate Type: [1:Serosanguineous]  [2:N/A] [N/A:N/A] Exudate Color: [1:red, brown] [2:N/A] [N/A:N/A] Wound Margin: [1:Distinct, outline attached] [2:Flat and Intact] [N/A:N/A] Granulation Amount: [1:None Present (0%)] [2:None Present (0%)] [N/A:N/A] Necrotic Amount: [1:Large (67-100%)] [2:Large (67-100%)] [N/A:N/A] Necrotic Tissue: [1:Adherent Slough] [2:Eschar] [N/A:N/A] Exposed Structures: [1:Fat Layer (Subcutaneous Tissue) Exposed: Yes] [2:Fascia: No Fat Layer (Subcutaneous Tissue) Exposed: No Tendon: No Muscle: No Joint: No Bone: No Limited to Skin Breakdown] [N/A:N/A] Epithelialization: [1:None] [2:None] [N/A:N/A] Periwound Skin Texture: [1:Excoriation: No Induration: No] [2:No Abnormalities Noted] [N/A:N/A] Callus: No Crepitus: No Rash: No Scarring: No Periwound Skin Moisture: Maceration: No No Abnormalities Noted N/A Dry/Scaly: No Periwound Skin Color: Erythema: Yes No Abnormalities Noted N/A Rubor: Yes Atrophie Blanche: No Cyanosis: No Ecchymosis: No Hemosiderin Staining: No Mottled: No Pallor: No Erythema Location: Circumferential N/A N/A Temperature: No Abnormality N/A N/A Tenderness on Palpation: Yes No N/A Wound Preparation: Ulcer Cleansing: Ulcer Cleansing: N/A Rinsed/Irrigated with Saline Rinsed/Irrigated with Saline Topical Anesthetic Applied: Topical Anesthetic Applied: Other: lidocaine 4% None Treatment Notes Electronic Signature(s) Signed: 04/19/2018 4:56:04 PM By: Montey Hora Entered By: Montey Hora on 04/19/2018 11:40:28 Fahl, [name] ([id number]) -------------------------------------------------------------------------------- [place] Details Patient Name: [name] Date of Service: 04/19/2018 11:00 AM Medical Record Number: [id number] Patient Account Number: [id number] Date of Birth/Sex: 1923/10/25 (84 y.o. F) Treating RN: Montey Hora Primary Care Flavio Lindroth: FITZGERALD, DAVID Other Clinician: Referring Deanthony Maull: FITZGERALD, DAVID Treating  Ziasia Lenoir/Extender: STONE III, HOYT Weeks in Treatment: 35 Active Inactive ` Abuse / Safety / Falls / Self Care Management Nursing Diagnoses: Potential for falls Goals: Patient will not experience any injury related to falls Date Initiated: 08/10/2017 Target Resolution Date: 11/10/2017 Goal Status: Active Interventions: Assess Activities of Daily Living upon admission and as needed Assess fall risk on admission and as needed Assess: immobility, friction, shearing, incontinence upon admission and as needed Notes: ` Nutrition Nursing Diagnoses: Imbalanced nutrition Potential for alteratiion in Nutrition/Potential for imbalanced nutrition Goals: Patient/caregiver agrees to and verbalizes understanding of need to use nutritional supplements and/or vitamins as prescribed Date Initiated: 08/10/2017 Target Resolution Date: 12/08/2017 Goal Status: Active Interventions: Assess patient nutrition upon admission and as needed per policy Notes: ` Orientation to the Wound Care Program Nursing Diagnoses: Knowledge deficit related to the wound healing center program Goals: Patient/caregiver will verbalize understanding of the [place] Program Date Initiated: 08/10/2017 Target Resolution Date: 09/08/2017 BUENA, BOEHM ([id number]) Goal Status: Active Interventions: Provide education on orientation to the wound center Notes: ` Pain, Acute or Chronic Nursing Diagnoses: Pain, acute or chronic: actual or potential Potential alteration in comfort, pain Goals: Patient/caregiver will verbalize adequate pain control between visits Date Initiated: 08/10/2017 Target Resolution Date: 12/08/2017 Goal Status: Active Interventions: Complete pain assessment as per visit requirements Notes: ` Wound/Skin Impairment Nursing Diagnoses: Impaired tissue integrity Knowledge deficit related to ulceration/compromised skin integrity  Goals: Ulcer/skin breakdown will have a volume reduction of  80% by week 12 Date Initiated: 08/10/2017 Target Resolution Date: 12/01/2017 Goal Status: Active Interventions: Assess patient/caregiver ability to perform ulcer/skin care regimen upon admission and as needed Notes: Electronic Signature(s) Signed: 04/19/2018 4:56:04 PM By: Montey Hora Entered By: Montey Hora on 04/19/2018 11:40:00 Henney, [name] ([id number]) -------------------------------------------------------------------------------- Pain Assessment Details Patient Name: [name] Date of Service: 04/19/2018 11:00 AM Medical Record Number: [id number] Patient Account Number: [id number] Date of Birth/Sex: 06/19/1923 (84 y.o. F) Treating RN: Cornell Barman Primary Care Amparo Donalson: FITZGERALD, DAVID Other Clinician: Referring Keyanah Kozicki: FITZGERALD, DAVID Treating Miyani Cronic/Extender: STONE III, HOYT Weeks in Treatment: 36 Active Problems Location of Pain Severity and Description of Pain Patient Has Paino No Site Locations With Dressing Change: No Pain Management and Medication Current Pain Management: Electronic Signature(s) Signed: 04/19/2018 5:09:47 PM By: Gretta Cool, BSN, RN, CWS, Kim RN, BSN Entered By: Gretta Cool, BSN, RN, CWS, Kim on 04/19/2018 11:10:45 Hockley, [name] ([id number]) -------------------------------------------------------------------------------- Patient/Caregiver Education Details Patient Name: [name] Date of Service: 04/19/2018 11:00 AM Medical Record Number: [id number] Patient Account Number: [id number] Date of Birth/Gender: 12-Feb-1923 (84 y.o. F) Treating RN: Ahmed Prima Primary Care Physician: FITZGERALD, DAVID Other Clinician: Referring Physician: FITZGERALD, DAVID Treating Physician/Extender: Melburn Hake, HOYT Weeks in Treatment: 64 Education Assessment Education Provided To: Patient Education Topics Provided Wound/Skin Impairment: Handouts: Caring for Your Ulcer, Other: change dressing as ordered Methods: Demonstration,  Explain/Verbal Responses: State content correctly Electronic Signature(s) Signed: 04/19/2018 4:38:26 PM By: Alric Quan Entered By: Alric Quan on 04/19/2018 13:46:30 Craney, [name] ([id number]) -------------------------------------------------------------------------------- Wound Assessment Details Patient Name: [name] Date of Service: 04/19/2018 11:00 AM Medical Record Number: [id number] Patient Account Number: [id number] Date of Birth/Sex: 1923-09-19 (84 y.o. F) Treating RN: Cornell Barman Primary Care Vincenzina Jagoda: FITZGERALD, DAVID Other Clinician: Referring Larraine Argo: FITZGERALD, DAVID Treating Damain Broadus/Extender: STONE III, HOYT Weeks in Treatment: 36 Wound Status Wound Number: 1 Primary Lymphedema Etiology: Wound Location: Right Malleolus - Lateral Secondary Arterial Insufficiency Ulcer Wounding Event: Gradually Appeared Etiology: Date Acquired: 08/11/2015 Wound Status: Open Weeks Of Treatment: 36 Comorbid Cataracts, Congestive Heart Failure, Clustered Wound: No History: Hypertension, Osteoarthritis, Neuropathy Wound Measurements Length: (cm) 2 Width: (cm) 2 Depth: (cm) 0.5 Area: (cm) 3.142 Volume: (cm) 1.571 % Reduction in Area: -105.1% % Reduction in Volume: -242.3% Epithelialization: None Tunneling: No Undermining: No Wound Description Full Thickness Without Exposed Support Foul Odo Classification: Structures Slough/F Wound Margin: Distinct, outline attached Exudate Medium Amount: Exudate Type: Serosanguineous Exudate Color: red, brown r After Cleansing: No ibrino Yes Wound Bed Granulation Amount: None Present (0%) Exposed Structure Necrotic Amount: Large (67-100%) Fat Layer (Subcutaneous Tissue) Exposed: Yes Necrotic Quality: Adherent Slough Periwound Skin Texture Texture Color No Abnormalities Noted: No No Abnormalities Noted: No Callus: No Atrophie Blanche: No Crepitus: No Cyanosis: No Excoriation: No Ecchymosis:  No Induration: No Erythema: Yes Rash: No Erythema Location: Circumferential Scarring: No Hemosiderin Staining: No Mottled: No Moisture Pallor: No No Abnormalities Noted: No Rubor: Yes Dry / Scaly: No Maceration: No Temperature / Pain Temperature: No Abnormality Tenderness on Palpation: Yes Gentle, [name] ([id number]) Wound Preparation Ulcer Cleansing: Rinsed/Irrigated with Saline Topical Anesthetic Applied: Other: lidocaine 4%, Treatment Notes Wound #1 (Right, Lateral Malleolus) 1. Cleansed with: Clean wound with Normal Saline 2. Anesthetic Topical Lidocaine 4% cream to wound bed prior to debridement 3. Peri-wound Care: Skin Prep 4. Dressing Applied: Iodoflex 5. Secondary Dressing Applied Bordered Foam Dressing Dry Gauze Electronic Signature(s) Signed: 04/19/2018 4:56:04 PM By: Montey Hora Signed: 04/19/2018  5:09:47 PM By: Gretta Cool, BSN, RN, CWS, Kim RN, BSN Entered By: Montey Hora on 04/19/2018 11:43:30 Sinkler, [name] ([id number]) -------------------------------------------------------------------------------- Wound Assessment Details Patient Name: [name] Date of Service: 04/19/2018 11:00 AM Medical Record Number: [id number] Patient Account Number: [id number] Date of Birth/Sex: 1923/10/26 (84 y.o. F) Treating RN: Cornell Barman Primary Care Tannah Dreyfuss: FITZGERALD, DAVID Other Clinician: Referring Jimena Wieczorek: FITZGERALD, DAVID Treating Rhia Blatchford/Extender: STONE III, HOYT Weeks in Treatment: 36 Wound Status Wound Number: 2 Primary Pressure Ulcer Etiology: Wound Location: Left Malleolus - Lateral Wound Open Wounding Event: Gradually Appeared Status: Date Acquired: 04/12/2018 Comorbid Cataracts, Congestive Heart Failure, Weeks Of Treatment: 0 History: Hypertension, Osteoarthritis, Neuropathy Clustered Wound: No Wound Measurements Length: (cm) 1 Width: (cm) 1 Depth: (cm) 0.1 Area: (cm) 0.785 Volume: (cm) 0.079 % Reduction in Area: 0% %  Reduction in Volume: 0% Epithelialization: None Tunneling: No Undermining: No Wound Description Classification: Category/Stage II Foul Wound Margin: Flat and Intact Sloug Exudate Amount: Medium Exudate Type: Serous Exudate Color: amber Odor After Cleansing: No h/Fibrino No Wound Bed Granulation Amount: None Present (0%) Exposed Structure Necrotic Amount: Large (67-100%) Fascia Exposed: No Necrotic Quality: Eschar Fat Layer (Subcutaneous Tissue) Exposed: No Tendon Exposed: No Muscle Exposed: No Joint Exposed: No Bone Exposed: No Limited to Skin Breakdown Periwound Skin Texture Texture Color No Abnormalities Noted: No No Abnormalities Noted: No Moisture No Abnormalities Noted: No Wound Preparation Ulcer Cleansing: Rinsed/Irrigated with Saline Topical Anesthetic Applied: None Treatment Notes Wound #2 (Left, Lateral Malleolus) Berkemeier, [name] ([id number]) 1. Cleansed with: Clean wound with Normal Saline 2. Anesthetic Topical Lidocaine 4% cream to wound bed prior to debridement 3. Peri-wound Care: Skin Prep 4. Dressing Applied: Other dressing (specify in notes) 5. Secondary Dressing Applied Bordered Foam Dressing Dry Gauze Notes betadine Electronic Signature(s) Signed: 04/19/2018 4:56:04 PM By: Montey Hora Signed: 04/19/2018 5:09:47 PM By: Gretta Cool, BSN, RN, CWS, Kim RN, BSN Entered By: Montey Hora on 04/19/2018 11:43:45 Khanna, [name] ([id number]) -------------------------------------------------------------------------------- [place] Details Patient Name: [name] Date of Service: 04/19/2018 11:00 AM Medical Record Number: [id number] Patient Account Number: [id number] Date of Birth/Sex: 22-Nov-1923 (84 y.o. F) Treating RN: Cornell Barman Primary Care Rubena Roseman: FITZGERALD, DAVID Other Clinician: Referring Ruthell Feigenbaum: FITZGERALD, DAVID Treating Blaike Newburn/Extender: STONE III, HOYT Weeks in Treatment: 36 Vital Signs Time Taken: 11:10 Temperature  (F): 98.0 Height (in): 65 Pulse (bpm): 86 Weight (lbs): 154.3 Respiratory Rate (breaths/min): 16 Body Mass Index (BMI): 25.7 Blood Pressure (mmHg): 144/72 Reference Range: 80 - 120 mg / dl Electronic Signature(s) Signed: 04/19/2018 5:09:47 PM By: Gretta Cool, BSN, RN, CWS, Kim RN, BSN Entered By: Gretta Cool, BSN, RN, CWS, Kim on 04/19/2018 11:12:54

## 2018-04-22 ENCOUNTER — Ambulatory Visit: Payer: Medicare Other | Admitting: Physician Assistant

## 2018-04-22 ENCOUNTER — Ambulatory Visit (INDEPENDENT_AMBULATORY_CARE_PROVIDER_SITE_OTHER): Payer: Medicare Other | Admitting: Podiatry

## 2018-04-22 ENCOUNTER — Encounter: Payer: Self-pay | Admitting: Podiatry

## 2018-04-22 DIAGNOSIS — B351 Tinea unguium: Secondary | ICD-10-CM

## 2018-04-22 DIAGNOSIS — M79676 Pain in unspecified toe(s): Secondary | ICD-10-CM | POA: Diagnosis not present

## 2018-04-22 DIAGNOSIS — I739 Peripheral vascular disease, unspecified: Secondary | ICD-10-CM | POA: Diagnosis not present

## 2018-04-22 DIAGNOSIS — G619 Inflammatory polyneuropathy, unspecified: Secondary | ICD-10-CM

## 2018-04-22 DIAGNOSIS — G629 Polyneuropathy, unspecified: Secondary | ICD-10-CM

## 2018-04-22 NOTE — Progress Notes (Addendum)
Complaint:  Visit Type: Patient returns to my office for continued preventative foot care services. Complaint: Patient states" my nails have grown long and thick and become painful to walk and wear shoes" Patient has been diagnosed with DM with no foot complications. The patient presents for preventative foot care services. No changes to ROS  Podiatric Exam: Vascular: dorsalis pedis  are palpable bilateral.  Weak posterior tibial pulses  B/L.Capillary return is immediate. Temperature gradient is WNL. Skin turgor WNL  Sensorium: Diminished  Semmes Weinstein monofilament test. Normal tactile sensation bilaterally. Nail Exam: Pt has thick disfigured discolored nails with subungual debris noted bilateral entire nail hallux through fifth toenails Ulcer Exam: There is no evidence of ulcer or pre-ulcerative changes or infection. Orthopedic Exam: Muscle tone and strength are WNL. No limitations in general ROM. No crepitus or effusions noted. Foot type and digits show no abnormalities. Bony prominences are unremarkable. Skin: No Porokeratosis. No infection .  Bandaged ulcers lateral malleolus  B/L.  Diagnosis:  Onychomycosis, , Pain in right toe, pain in left toes  Treatment & Plan Procedures and Treatment: Consent by patient was obtained for treatment procedures.   Debridement of mycotic and hypertrophic toenails, 1 through 5 bilateral and clearing of subungual debris. No ulceration, no infection noted.  Return Visit-Office Procedure: Patient instructed to return to the office for a follow up visit 3 months for continued evaluation and treatment.    Gardiner Barefoot DPM

## 2018-04-25 ENCOUNTER — Encounter: Payer: Medicare Other | Admitting: Nurse Practitioner

## 2018-04-25 DIAGNOSIS — I70233 Atherosclerosis of native arteries of right leg with ulceration of ankle: Secondary | ICD-10-CM | POA: Diagnosis not present

## 2018-04-27 NOTE — Progress Notes (Signed)
HAEVYN, URY ([id number]) Visit Report for 04/25/2018 Arrival Information Details Patient Name: [name], [name] Date of Service: 04/25/2018 1:45 PM Medical Record Number: [id number] Patient Account Number: [id number] Date of Birth/Sex: 10-Oct-1923 (84 y.o. F) Treating RN: Montey Hora Primary Care Siana Panameno: FITZGERALD, DAVID Other Clinician: Referring Rynlee Lisbon: FITZGERALD, DAVID Treating Tarez Bowns/Extender: Cathie Olden in Treatment: 45 Visit Information History Since Last Visit Added or deleted any medications: No Patient Arrived: Cane Any new allergies or adverse reactions: No Arrival Time: 13:54 Had a fall or experienced change in No Accompanied By: self activities of daily living that may affect Transfer Assistance: None risk of falls: Patient Identification Verified: Yes Signs or symptoms of abuse/neglect since last visito No Secondary Verification Process Completed: Yes Hospitalized since last visit: No Patient Requires Transmission-Based Precautions: No Implantable device outside of the clinic excluding No Patient Has Alerts: No cellular tissue based products placed in the center since last visit: Has Dressing in Place as Prescribed: Yes Has Compression in Place as Prescribed: Yes Pain Present Now: No Electronic Signature(s) Signed: 04/25/2018 5:31:29 PM By: Montey Hora Entered By: Montey Hora on 04/25/2018 13:55:07 Scholer, [name] ([id number]) -------------------------------------------------------------------------------- Encounter Discharge Information Details Patient Name: [name] Date of Service: 04/25/2018 1:45 PM Medical Record Number: [id number] Patient Account Number: [id number] Date of Birth/Sex: [birth year] (84 y.o. F) Treating RN: Cornell Barman Primary Care Dyshaun Bonzo: FITZGERALD, DAVID Other Clinician: Referring Ariella Voit: FITZGERALD, DAVID Treating Colby Reels/Extender: Cathie Olden in Treatment: 34 Encounter Discharge  Information Items Discharge Condition: Stable Ambulatory Status: Ambulatory Discharge Destination: Home Transportation: Private Auto Accompanied By: daughter Schedule Follow-up Appointment: Yes Clinical Summary of Care: Electronic Signature(s) Signed: 04/25/2018 5:02:07 PM By: Gretta Cool, BSN, RN, CWS, Kim RN, BSN Entered By: Gretta Cool, BSN, RN, CWS, Kim on 04/25/2018 14:30:00 Mofield, [name] ([id number]) -------------------------------------------------------------------------------- Lower Extremity Assessment Details Patient Name: [name] Date of Service: 04/25/2018 1:45 PM Medical Record Number: [id number] Patient Account Number: [id number] Date of Birth/Sex: 09-Feb-1923 (84 y.o. F) Treating RN: Montey Hora Primary Care Linsi Humann: FITZGERALD, DAVID Other Clinician: Referring Terril Amaro: FITZGERALD, DAVID Treating Rustin Erhart/Extender: Cathie Olden in Treatment: 36 Vascular Assessment Pulses: Dorsalis Pedis Palpable: [Left:Yes] [Right:Yes] Posterior Tibial Extremity colors, hair growth, and conditions: Extremity Color: [Left:Normal] [Right:Normal] Hair Growth on Extremity: [Left:No] [Right:No] Temperature of Extremity: [Left:Warm] [Right:Warm] Capillary Refill: [Left:< 3 seconds] [Right:< 3 seconds] Blood Pressure: Brachial: [Right:105] Dorsalis Pedis: [Left:Dorsalis Pedis: 110] Ankle: Posterior Tibial: [Left:Posterior Tibial: 110] [Right:1.05] Toe Nail Assessment Left: Right: Thick: Yes Yes Discolored: Yes Yes Deformed: Yes Yes Improper Length and Hygiene: No No Electronic Signature(s) Signed: 04/25/2018 5:02:07 PM By: Gretta Cool, BSN, RN, CWS, Kim RN, BSN Signed: 04/25/2018 5:31:29 PM By: Montey Hora Entered By: Gretta Cool, BSN, RN, CWS, Kim on 04/25/2018 14:20:15 Schwarzkopf, [name] ([id number]) -------------------------------------------------------------------------------- Multi Wound Chart Details Patient Name: [name] Date of Service: 04/25/2018  1:45 PM Medical Record Number: [id number] Patient Account Number: [id number] Date of Birth/Sex: 1923/07/01 (84 y.o. F) Treating RN: Roger Shelter Primary Care Geoff Dacanay: FITZGERALD, DAVID Other Clinician: Referring Errol Ala: FITZGERALD, DAVID Treating Tekla Malachowski/Extender: Lawanda Cousins Weeks in Treatment: 36 Vital Signs Height(in): 65 Pulse(bpm): 92 Weight(lbs): 154.3 Blood Pressure(mmHg): 122/66 Body Mass Index(BMI): 26 Temperature(F): 98.0 Respiratory Rate 16 (breaths/min): Photos: [1:No Photos] [2:No Photos] [N/A:N/A] Wound Location: [1:Right Malleolus - Lateral] [2:Left Malleolus - Lateral] [N/A:N/A] Wounding Event: [1:Gradually Appeared] [2:Gradually Appeared] [N/A:N/A] Primary Etiology: [1:Lymphedema] [2:Pressure Ulcer] [N/A:N/A] Secondary Etiology: [1:Arterial Insufficiency Ulcer] [2:N/A] [N/A:N/A] Comorbid History: [1:Cataracts, Congestive Heart Failure, Hypertension, Osteoarthritis, Neuropathy] [2:Cataracts, Congestive Heart Failure, Hypertension, Osteoarthritis,  Neuropathy] [N/A:N/A] Date Acquired: [1:08/11/2015] [2:04/12/2018] [N/A:N/A] Weeks of Treatment: [1:36] [2:0] [N/A:N/A] Wound Status: [1:Open] [2:Open] [N/A:N/A] Measurements L x W x D [1:2x1.7x0.6] [2:0.7x0.7x0.1] [N/A:N/A] (cm) Area (cm) : [1:2.67] [2:0.385] [N/A:N/A] Volume (cm) : [1:1.602] [2:0.038] [N/A:N/A] % Reduction in Area: [1:-74.30%] [2:51.00%] [N/A:N/A] % Reduction in Volume: [1:-249.00%] [2:51.90%] [N/A:N/A] Classification: [1:Full Thickness Without Exposed Support Structures] [2:Category/Stage II] [N/A:N/A] Exudate Amount: [1:Medium] [2:Medium] [N/A:N/A] Exudate Type: [1:Serosanguineous] [2:Serous] [N/A:N/A] Exudate Color: [1:red, brown] [2:amber] [N/A:N/A] Wound Margin: [1:Distinct, outline attached] [2:Flat and Intact] [N/A:N/A] Granulation Amount: [1:None Present (0%)] [2:None Present (0%)] [N/A:N/A] Necrotic Amount: [1:Large (67-100%)] [2:Large (67-100%)] [N/A:N/A] Necrotic Tissue:  [1:Adherent Slough] [2:Eschar] [N/A:N/A] Exposed Structures: [1:Fat Layer (Subcutaneous Tissue) Exposed: Yes] [2:Fascia: No Fat Layer (Subcutaneous Tissue) Exposed: No Tendon: No Muscle: No Joint: No Bone: No Limited to Skin Breakdown] [N/A:N/A] Epithelialization: [1:None] [2:None] [N/A:N/A] Debridement: [1:Debridement - Excisional 14:10] [2:N/A N/A] [N/A:N/A N/A] Pre-procedure Verification/Time Out Taken: Pain Control: Other N/A N/A Tissue Debrided: Subcutaneous, Slough N/A N/A Level: Skin/Subcutaneous Tissue N/A N/A Debridement Area (sq cm): 3.4 N/A N/A Instrument: Curette N/A N/A Bleeding: Minimum N/A N/A Hemostasis Achieved: Pressure N/A N/A Procedural Pain: 0 N/A N/A Post Procedural Pain: 0 N/A N/A Debridement Treatment Procedure was tolerated well N/A N/A Response: Post Debridement 2x1.7x0.6 N/A N/A Measurements L x W x D (cm) Post Debridement Volume: 1.602 N/A N/A (cm) Periwound Skin Texture: Excoriation: No No Abnormalities Noted N/A Induration: No Callus: No Crepitus: No Rash: No Scarring: No Periwound Skin Moisture: Maceration: No No Abnormalities Noted N/A Dry/Scaly: No Periwound Skin Color: Erythema: Yes No Abnormalities Noted N/A Rubor: Yes Atrophie Blanche: No Cyanosis: No Ecchymosis: No Hemosiderin Staining: No Mottled: No Pallor: No Erythema Location: Circumferential N/A N/A Temperature: No Abnormality N/A N/A Tenderness on Palpation: Yes No N/A Wound Preparation: Ulcer Cleansing: Ulcer Cleansing: N/A Rinsed/Irrigated with Saline Rinsed/Irrigated with Saline Topical Anesthetic Applied: Topical Anesthetic Applied: Other: lidocaine 4% Other: lidocaine 4% Procedures Performed: Debridement N/A N/A Treatment Notes Electronic Signature(s) Signed: 04/25/2018 2:16:52 PM By: Lawanda Cousins Entered By: Lawanda Cousins on 04/25/2018 14:16:51 Rajagopalan, [name]  ([id number]) -------------------------------------------------------------------------------- [place] Details Patient Name: [name] Date of Service: 04/25/2018 1:45 PM Medical Record Number: [id number] Patient Account Number: [id number] Date of Birth/Sex: January 29, 1923 (84 y.o. F) Treating RN: Roger Shelter Primary Care Yasuko Lapage: FITZGERALD, DAVID Other Clinician: Referring Caramia Boutin: FITZGERALD, DAVID Treating Farin Buhman/Extender: Cathie Olden in Treatment: 23 Active Inactive ` Abuse / Safety / Falls / Self Care Management Nursing Diagnoses: Potential for falls Goals: Patient will not experience any injury related to falls Date Initiated: 08/10/2017 Target Resolution Date: 11/10/2017 Goal Status: Active Interventions: Assess Activities of Daily Living upon admission and as needed Assess fall risk on admission and as needed Assess: immobility, friction, shearing, incontinence upon admission and as needed Notes: ` Nutrition Nursing Diagnoses: Imbalanced nutrition Potential for alteratiion in Nutrition/Potential for imbalanced nutrition Goals: Patient/caregiver agrees to and verbalizes understanding of need to use nutritional supplements and/or vitamins as prescribed Date Initiated: 08/10/2017 Target Resolution Date: 12/08/2017 Goal Status: Active Interventions: Assess patient nutrition upon admission and as needed per policy Notes: ` Orientation to the Wound Care Program Nursing Diagnoses: Knowledge deficit related to the wound healing center program Goals: Patient/caregiver will verbalize understanding of the [place] Program Date Initiated: 08/10/2017 Target Resolution Date: 09/08/2017 TY, OSHIMA ([id number]) Goal Status: Active Interventions: Provide education on orientation to the wound center Notes: ` Pain, Acute or Chronic Nursing Diagnoses: Pain, acute or chronic: actual or potential Potential alteration  in  comfort, pain Goals: Patient/caregiver will verbalize adequate pain control between visits Date Initiated: 08/10/2017 Target Resolution Date: 12/08/2017 Goal Status: Active Interventions: Complete pain assessment as per visit requirements Notes: ` Wound/Skin Impairment Nursing Diagnoses: Impaired tissue integrity Knowledge deficit related to ulceration/compromised skin integrity Goals: Ulcer/skin breakdown will have a volume reduction of 80% by week 12 Date Initiated: 08/10/2017 Target Resolution Date: 12/01/2017 Goal Status: Active Interventions: Assess patient/caregiver ability to perform ulcer/skin care regimen upon admission and as needed Notes: Electronic Signature(s) Signed: 04/25/2018 4:33:19 PM By: Roger Shelter Entered By: Roger Shelter on 04/25/2018 14:09:02 Butrum, [name] ([id number]) -------------------------------------------------------------------------------- Pain Assessment Details Patient Name: [name] Date of Service: 04/25/2018 1:45 PM Medical Record Number: [id number] Patient Account Number: [id number] Date of Birth/Sex: [birth year] (84 y.o. F) Treating RN: Montey Hora Primary Care Taber Sweetser: FITZGERALD, DAVID Other Clinician: Referring Daine Croker: FITZGERALD, DAVID Treating Maijor Hornig/Extender: Cathie Olden in Treatment: 36 Active Problems Location of Pain Severity and Description of Pain Patient Has Paino Yes Site Locations Pain Location: Pain in Ulcers With Dressing Change: Yes Duration of the Pain. Constant / Intermittento Intermittent Pain Management and Medication Current Pain Management: Electronic Signature(s) Signed: 04/25/2018 5:31:29 PM By: Montey Hora Entered By: Montey Hora on 04/25/2018 13:55:55 Steuart, [name] ([id number]) -------------------------------------------------------------------------------- Patient/Caregiver Education Details Patient Name: [name] Date of Service: 04/25/2018 1:45  PM Medical Record Number: [id number] Patient Account Number: [id number] Date of Birth/Gender: 06/05/1923 (84 y.o. F) Treating RN: Cornell Barman Primary Care Physician: FITZGERALD, DAVID Other Clinician: Referring Physician: FITZGERALD, DAVID Treating Physician/Extender: Cathie Olden in Treatment: 67 Education Assessment Education Provided To: Patient Education Topics Provided Wound/Skin Impairment: Handouts: Caring for Your Ulcer, Other: continuee wound care as prescribed Methods: Demonstration, Explain/Verbal Responses: State content correctly Electronic Signature(s) Signed: 04/25/2018 5:02:07 PM By: Gretta Cool, BSN, RN, CWS, Kim RN, BSN Entered By: Gretta Cool, BSN, RN, CWS, Kim on 04/25/2018 14:30:20 Buscemi, [name] ([id number]) -------------------------------------------------------------------------------- Wound Assessment Details Patient Name: [name] Date of Service: 04/25/2018 1:45 PM Medical Record Number: [id number] Patient Account Number: [id number] Date of Birth/Sex: 20-Sep-1923 (84 y.o. F) Treating RN: Montey Hora Primary Care Tammatha Cobb: FITZGERALD, DAVID Other Clinician: Referring Kenshin Splawn: FITZGERALD, DAVID Treating Reagann Dolce/Extender: Lawanda Cousins Weeks in Treatment: 36 Wound Status Wound Number: 1 Primary Lymphedema Etiology: Wound Location: Right Malleolus - Lateral Secondary Arterial Insufficiency Ulcer Wounding Event: Gradually Appeared Etiology: Date Acquired: 08/11/2015 Wound Status: Open Weeks Of Treatment: 36 Comorbid Cataracts, Congestive Heart Failure, Clustered Wound: No History: Hypertension, Osteoarthritis, Neuropathy Photos Photo Uploaded By: Montey Hora on 04/25/2018 16:47:46 Wound Measurements Length: (cm) 2 Width: (cm) 1.7 Depth: (cm) 0.6 Area: (cm) 2.67 Volume: (cm) 1.602 % Reduction in Area: -74.3% % Reduction in Volume: -249% Epithelialization: None Tunneling: No Undermining: No Wound Description Full Thickness  Without Exposed Support Classification: Structures Wound Margin: Distinct, outline attached Exudate Medium Amount: Exudate Type: Serosanguineous Exudate Color: red, brown Foul Odor After Cleansing: No Slough/Fibrino Yes Wound Bed Granulation Amount: None Present (0%) Exposed Structure Necrotic Amount: Large (67-100%) Fat Layer (Subcutaneous Tissue) Exposed: Yes Necrotic Quality: Adherent Slough Periwound Skin Texture Texture Color No Abnormalities Noted: No No Abnormalities Noted: No Babinski, [name] ([id number]) Callus: No Atrophie Blanche: No Crepitus: No Cyanosis: No Excoriation: No Ecchymosis: No Induration: No Erythema: Yes Rash: No Erythema Location: Circumferential Scarring: No Hemosiderin Staining: No Mottled: No Moisture Pallor: No No Abnormalities Noted: No Rubor: Yes Dry / Scaly: No Maceration: No Temperature / Pain Temperature: No Abnormality Tenderness on Palpation: Yes Wound Preparation Ulcer Cleansing: Rinsed/Irrigated  with Saline Topical Anesthetic Applied: Other: lidocaine 4%, Treatment Notes Wound #1 (Right, Lateral Malleolus) 1. Cleansed with: Clean wound with Normal Saline 2. Anesthetic Topical Lidocaine 4% cream to wound bed prior to debridement 3. Peri-wound Care: Skin Prep 4. Dressing Applied: Iodoflex 5. Secondary Dressing Applied Bordered Foam Dressing Dry Gauze Electronic Signature(s) Signed: 04/25/2018 5:31:29 PM By: Montey Hora Entered By: Montey Hora on 04/25/2018 14:03:15 Lofaso, [name] ([id number]) -------------------------------------------------------------------------------- Wound Assessment Details Patient Name: [name] Date of Service: 04/25/2018 1:45 PM Medical Record Number: [id number] Patient Account Number: [id number] Date of Birth/Sex: December 09, 1922 (84 y.o. F) Treating RN: Montey Hora Primary Care Eh Sesay: FITZGERALD, DAVID Other Clinician: Referring Derrica Sieg: FITZGERALD,  DAVID Treating Alvis Pulcini/Extender: Lawanda Cousins Weeks in Treatment: 36 Wound Status Wound Number: 2 Primary Pressure Ulcer Etiology: Wound Location: Left Malleolus - Lateral Wound Open Wounding Event: Gradually Appeared Status: Date Acquired: 04/12/2018 Comorbid Cataracts, Congestive Heart Failure, Weeks Of Treatment: 0 History: Hypertension, Osteoarthritis, Neuropathy Clustered Wound: No Photos Photo Uploaded By: Montey Hora on 04/25/2018 16:43:28 Wound Measurements Length: (cm) 0.7 Width: (cm) 0.7 Depth: (cm) 0.1 Area: (cm) 0.385 Volume: (cm) 0.038 % Reduction in Area: 51% % Reduction in Volume: 51.9% Epithelialization: None Tunneling: No Undermining: No Wound Description Classification: Category/Stage II Foul O Wound Margin: Flat and Intact Slough Exudate Amount: Medium Exudate Type: Serous Exudate Color: amber dor After Cleansing: No /Fibrino No Wound Bed Granulation Amount: None Present (0%) Exposed Structure Necrotic Amount: Large (67-100%) Fascia Exposed: No Necrotic Quality: Eschar Fat Layer (Subcutaneous Tissue) Exposed: No Tendon Exposed: No Muscle Exposed: No Joint Exposed: No Bone Exposed: No Limited to Skin Breakdown Periwound Skin Texture Oliveto, [name] ([id number]) Texture Color No Abnormalities Noted: No No Abnormalities Noted: No Moisture No Abnormalities Noted: No Wound Preparation Ulcer Cleansing: Rinsed/Irrigated with Saline Topical Anesthetic Applied: Other: lidocaine 4%, Treatment Notes Wound #2 (Left, Lateral Malleolus) 1. Cleansed with: Clean wound with Normal Saline 2. Anesthetic Topical Lidocaine 4% cream to wound bed prior to debridement 3. Peri-wound Care: Skin Prep 4. Dressing Applied: Iodoflex 5. Secondary Dressing Applied Bordered Foam Dressing Dry Gauze Electronic Signature(s) Signed: 04/25/2018 5:31:29 PM By: Montey Hora Entered By: Montey Hora on 04/25/2018 14:03:49 Burandt, [name]  ([id number]) -------------------------------------------------------------------------------- [place] Details Patient Name: [name] Date of Service: 04/25/2018 1:45 PM Medical Record Number: [id number] Patient Account Number: [id number] Date of Birth/Sex: August 18, 1923 (84 y.o. F) Treating RN: Montey Hora Primary Care Shed Nixon: FITZGERALD, DAVID Other Clinician: Referring Abdallah Hern: FITZGERALD, DAVID Treating Jaana Brodt/Extender: Lawanda Cousins Weeks in Treatment: 36 Vital Signs Time Taken: 13:56 Temperature (F): 98.0 Height (in): 65 Pulse (bpm): 92 Weight (lbs): 154.3 Respiratory Rate (breaths/min): 16 Body Mass Index (BMI): 25.7 Blood Pressure (mmHg): 122/66 Reference Range: 80 - 120 mg / dl Electronic Signature(s) Signed: 04/25/2018 5:31:29 PM By: Montey Hora Entered By: Montey Hora on 04/25/2018 13:57:13

## 2018-04-27 NOTE — Progress Notes (Signed)
[name], [name] ([id number]) Visit Report for 04/25/2018 Chief Complaint Document Details Patient Name: [name], [name]. Date of Service: 04/25/2018 1:45 PM Medical Record Number: [id number] Patient Account Number: [id number] Date of Birth/Sex: 1923/11/07 (84 y.o. F) Treating RN: Roger Shelter Primary Care Provider: FITZGERALD, DAVID Other Clinician: Referring Provider: FITZGERALD, DAVID Treating Provider/Extender: Cathie Olden in Treatment: 36 Information Obtained from: Patient Chief Complaint Patient is here for right lateral malleolus and left lateral malleolus ulcer Electronic Signature(s) Signed: 04/25/2018 2:18:16 PM By: Lawanda Cousins Entered By: Lawanda Cousins on 04/25/2018 14:18:16 [name], [name] ([id number]) -------------------------------------------------------------------------------- Debridement Details Patient Name: [name] Date of Service: 04/25/2018 1:45 PM Medical Record Number: [id number] Patient Account Number: [id number] Date of Birth/Sex: [birth year] (84 y.o. F) Treating RN: Roger Shelter Primary Care Provider: FITZGERALD, DAVID Other Clinician: Referring Provider: FITZGERALD, DAVID Treating Provider/Extender: Cathie Olden in Treatment: 36 Debridement Performed for Wound #1 Right,Lateral Malleolus Assessment: Performed By: Physician Lawanda Cousins, NP Debridement Type: Debridement Severity of Tissue Pre Fat layer exposed Debridement: Pre-procedure Verification/Time Yes - 14:10 Out Taken: Start Time: 14:10 Pain Control: Other : lidocaine 4% Total Area Debrided (L x W): 2 (cm) x 1.7 (cm) = 3.4 (cm) Tissue and other material Non-Viable, Slough, Subcutaneous, Biofilm, Slough debrided: Level: Skin/Subcutaneous Tissue Debridement Description: Excisional Instrument: Curette Bleeding: Minimum Hemostasis Achieved: Pressure End Time: 14:12 Procedural Pain: 0 Post Procedural Pain: 0 Response to Treatment: Procedure was  tolerated well Level of Consciousness: Awake and Alert Post Procedure Vitals: Temperature: 98.0 Pulse: 92 Respiratory Rate: 16 Blood Pressure: Systolic Blood Pressure: 263 Diastolic Blood Pressure: 66 Post Debridement Measurements of Total Wound Length: (cm) 2 Width: (cm) 1.7 Depth: (cm) 0.6 Volume: (cm) 1.602 Character of Wound/Ulcer Post Debridement: Stable Severity of Tissue Post Debridement: Fat layer exposed Post Procedure Diagnosis Same as Pre-procedure Electronic Signature(s) Signed: 04/25/2018 2:17:40 PM By: Lawanda Cousins Signed: 04/25/2018 4:33:19 PM By: Mignon Pine ([id number]) Entered By: Lawanda Cousins on 04/25/2018 14:17:39 [name], [name] ([id number]) -------------------------------------------------------------------------------- HPI Details Patient Name: [name] Date of Service: 04/25/2018 1:45 PM Medical Record Number: [id number] Patient Account Number: [id number] Date of Birth/Sex: [birth year] (84 y.o. 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 Dr. Amalia Hailey the podiatrist saw her on 07/20/2017 and sent her to the wound center for possible hyperbaric oxygen therapy. past medical history of peripheral vascular disease, varicose veins, status post appendectomy, basal cell carcinoma excision from the left leg, cholecystectomy, pacemaker placement, right lower extremity angiography done by Dr. dew in March 2017 with placement of a stent. there is also note of a successful ablation of the right small saphenous vein done which was reviewed by  ultrasound on 10/24/2016. the patient had a right small saphenous vein ablation done on 10/20/2016. The patient has never been a smoker. She has been seen by Dr. Corene Cornea dew the vascular surgeon who most recently saw her on 06/15/2017 for evaluation of ongoing problems with right leg swelling. She had a lower extremity arterial duplex examination done(02/13/17) which showed patent distal right superficial femoral artery stent and above-the-knee popliteal stent without evidence of restenosis. The ABI was more than 1.3 on the right and more than 1.3 on the left.  This was consistent with noncompressible arteries due to medial calcification. The right great toe pressure and PPG waveforms are within normal limits and the left great toe pressure and PPG waveforms are decreased. he recommended she continue to wear her compression stockings and continue with elevation. She is scheduled to have a noninvasive arterial study in the near future 08/16/2017 -- had a lower extremity arterial duplex examination done which showed patent distal right superficial femoral artery stent and above-the-knee popliteal stent without evidence of restenosis. The ABI was more than 1.3 on the right and more than 1.3 on the left. This was consistent with noncompressible arteries due to medial calcification. The right great toe pressure and PPG waveforms are within normal limits and the left great toe pressure and PPG waveforms are decreased. the x-ray of the right ankle has not yet been done 08/24/2017 -- had a right ankle x-ray -- IMPRESSION:1. No fracture, bone lesion or evidence of osteomyelitis. 2. Lateral soft tissue swelling with a soft tissue ulcer. she has not yet seen the vascular surgeon for review 08/31/17 on evaluation today patient's wound appears to be showing signs of improvement. She still with her appointment with vascular in order to review her results of her vascular study and then determine if any intervention  would be recommended at that time. No fevers, chills, nausea, or vomiting noted at this time. She has been tolerating the dressing changes without complication. 09/28/17 on evaluation today patient's wound appears to show signs of good improvement in regard to the granulation tissue which is surfacing. There is still a layer of slough covering the wound and the posterior portion is still significantly deeper than the anterior nonetheless there has been some good sign of things moving towards the better. She is going to go back to Dr. dew for reevaluation to ensure her blood flow is still appropriate. That will be before her next evaluation with [place] next week. No fevers, chills, nausea, or vomiting noted at this time. Patient does have some discomfort rated to be a 3-4/10 depending on activity specifically cleansing the wound makes it worse. 10/05/2017 -- the patient was seen by Dr. Lucky Cowboy last week and noninvasive studies showed a normal right ABI with brisk triphasic waveforms consistent with no arterial insufficiency including normal digital pressures. The duplex showed a patent distal right SFA stent and the proximal SFA was also normal. He was pleased with her test and thought she should have enough of perfusion for normal wound healing. He would see her back in 6 months time. 12/21/17 on evaluation today patient appears to be doing fairly well in regard to her right lateral ankle wound. Unfortunately the main issue that she is expansion at this point is that she is having some issues with what appears to be some cellulitis in the [name], [name]. ([id number]) right anterior shin. She has also been noting a little bit of uncomfortable feeling especially last night and her ankle area. I'm afraid that she made the developing a little bit of an infection. With that being said I think it is in the early stages. 12/28/17 on evaluation today patient's ankle appears to be doing excellent. She's making  good progress at this point the cellulitis seems to have improved after last week's evaluation. Overall she is having no significant discomfort which is excellent news. She does have an appointment with Dr. dew on March 29, 2018 for reevaluation in regard to the stent he placed. She seems to have excellent blood flow  in the right lower extremity. 01/19/12 on evaluation today patient's wound appears to be doing very well. In fact she does not appear to require debridement at this point, there's no evidence of infection, and overall from the standpoint of the wound she seems to be doing very well. With that being said I believe that it may be time to switch to different dressing away from the [facility] Dressing she tells me she does have a lot going on her friend actually passed away yesterday and she's also having a lot of issues with her husband this obviously is weighing heavy on her as far as your thoughts and concerns today. 01/25/18 on evaluation today patient appears to be doing fairly well in regard to her right lateral malleolus. She has been tolerating the dressing changes without complication. Overall I feel like this is definitely showing signs of improvement as far as how the overall appearance of the wound is there's also evidence of epithelium start to migrate over the granulation tissue. In general I think that she is progressing nicely as far as the wound is concerned. The only concern she really has is whether or not we can switch to every other week visits in order to avoid having as many appointments as her daughters have a difficult time getting her to her appointments as well as the patient's husband to his he is not doing very well at this point. 02/22/18 on evaluation today patient's right lateral malleolus ulcer appears to be doing great. She has been tolerating the dressing changes without complication. Overall you making excellent progress at this time. Patient is having no  significant discomfort. 03/15/18 on evaluation today patient appears to be doing much more poorly in regard to her right lateral ankle ulcer at this point. Unfortunately since have last seen her her husband has passed just a few days ago is obviously weighed heavily on her her daughter also had surgery well she is with her today as usual. There does not appear to be any evidence of infection she does seem to have significant contusion/deep tissue injury to the right lateral malleolus which was not noted previous when I saw her last. It's hard to tell of exactly when this injury occurred although during the time she was spending the night in the hospital this may have been most likely. 03/22/18 on evaluation today patient appears to actually be doing very well in regard to her ulcer. She did unfortunately have a setback which was noted last week however the good news is we seem to be getting back on track and in fact the wound in the core did still have some necrotic tissue which will be addressed at this point today but in general I'm seeing signs that things are on the up and up. She is glad to hear this obviously she's been somewhat concerned that due to the how her wound digressed more recently. 03/29/18 on evaluation today patient appears to be doing fairly well in regard to her right lower extremity lateral malleolus ulcer. She unfortunately does have a new area of pressure injury over the inferior portion where the wound has opened up a little bit larger secondary to the pressure she seems to be getting. She does tell me sometimes when she sleeps at night that it actually hurts and does seem to be pushing on the area little bit more unfortunately. There does not appear to be any evidence of infection which is good news. She has been tolerating the  dressing changes without complication. She also did have some bruising in the left second and third toes due to the fact that she may have bump this  or injured it although she has neuropathy so she does not feel she did move recently that may have been where this came from. Nonetheless there does not appear to be any evidence of infection at this time. 04/12/18 on evaluation today patient's wound on the right lateral ankle actually appears to be doing a little bit better with a lot of necrotic docking tissue centrally loosening up in clearing away. However she does have the beginnings of a deep tissue injury on the left lateral malleolus likely due to the fact we've been trying offload the right as much as we have. I think she may benefit from an assistive soft device to help with offloading and it looks like they're looking at one of the doughnut conditions that wraps around the lower leg to offload which I think will definitely do a good job. With that being said I think we definitely need to address this issue on the left before it becomes a wound. Patient is not having significant pain. 04/19/18 on evaluation today patient appears to be doing excellent in regard to the progress she's made with her right lateral ankle ulcer. The left ankle region which did show evidence of a deep tissue injury seems to be resolving there's little fluid noted underneath and a blister there's nothing open at this point in time overall I feel like this is progressing nicely which is good news. She does not seem to be having significant discomfort at this point which is also good news. 04/25/18-She is here in follow up evaluation for bilateral lateral malleolar ulcers. The right lateral malleolus ulcer with pale subcutaneous tissue exposure, central area of ulcer with tendon/periosteum exposed. The left lateral malleolus ulcer now with Coll, [name] ([id number]) central area of nonviable tissue, otherwise deep tissue injury. She is wearing compression wraps to the left lower extremity, she will place the right lower extremity compression wraps on when she gets  home. She will be out of town over the weekend and return next week and follow-up appointment. She completed her doxycycline this morning Electronic Signature(s) Signed: 04/25/2018 2:26:45 PM By: Lawanda Cousins Previous Signature: 04/25/2018 2:20:18 PM Version By: Lawanda Cousins Entered By: Lawanda Cousins on 04/25/2018 14:26:45 [name], [name] ([id number]) -------------------------------------------------------------------------------- Physician Orders Details Patient Name: [name] Date of Service: 04/25/2018 1:45 PM Medical Record Number: [id number] Patient Account Number: [id number] Date of Birth/Sex: Apr 21, 1923 (84 y.o. F) Treating RN: Roger Shelter Primary Care Provider: FITZGERALD, DAVID Other Clinician: Referring Provider: FITZGERALD, DAVID Treating Provider/Extender: Cathie Olden in Treatment: 62 Verbal / Phone Orders: No Diagnosis Coding Wound Cleansing Wound #1 Right,Lateral Malleolus o Clean wound with Normal Saline. o Cleanse wound with mild soap and water o May Shower, gently pat wound dry prior to applying new dressing. Anesthetic (add to Medication List) Wound #1 Right,Lateral Malleolus o Topical Lidocaine 4% cream applied to wound bed prior to debridement (In Clinic Only). Primary Wound Dressing Wound #1 Right,Lateral Malleolus o Iodoflex Wound #2 Left,Lateral Malleolus o Iodoflex Secondary Dressing Wound #1 Right,Lateral Malleolus o Boardered Foam Dressing Wound #2 Left,Lateral Malleolus o Boardered Foam Dressing Dressing Change Frequency Wound #1 Right,Lateral Malleolus o Change dressing every day. Wound #2 Left,Lateral Malleolus o Change dressing every day. Follow-up Appointments Wound #1 Right,Lateral Malleolus o Return Appointment in 1 week. Wound #2 Left,Lateral Malleolus o Return Appointment  in 1 week. Edema Control Wound #1 Right,Lateral Malleolus o Elevate legs to the level of the heart and pump  ankles as often as possible [place], [name] ([id number]) Wound #2 Left,Lateral Malleolus o Elevate legs to the level of the heart and pump ankles as often as possible Off-Loading Wound #1 Right,Lateral Malleolus o Turn and reposition every 2 hours o Other: - do not put pressure on your right ankle Wound #2 Left,Lateral Malleolus o Turn and reposition every 2 hours o Other: - do not put pressure on your right ankle Additional Orders / Instructions Wound #1 Right,Lateral Malleolus o Increase protein intake. o Other: - Add Vitamin C, Zinc, Vitamin A to your diet Wound #2 Left,Lateral Malleolus o Increase protein intake. o Other: - Add Vitamin C, Zinc, Vitamin A to your diet Electronic Signature(s) Signed: 04/25/2018 4:33:19 PM By: Roger Shelter Signed: 04/26/2018 8:17:02 AM By: Lawanda Cousins Entered By: Roger Shelter on 04/25/2018 14:15:28 [name], [name] ([id number]) -------------------------------------------------------------------------------- Problem List Details Patient Name: [name] Date of Service: 04/25/2018 1:45 PM Medical Record Number: [id number] Patient Account Number: [id number] Date of Birth/Sex: 06-18-1923 (84 y.o. 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 unspecified 08/10/2017 Yes severity I70.233 Atherosclerosis of native arteries of right leg with ulceration of 08/10/2017 Yes ankle I89.0 Lymphedema, not elsewhere classified 08/10/2017 Yes B35.4 Tinea corporis 09/28/2017 Yes L89.520 Pressure ulcer of left ankle, unstageable 04/25/2018 Yes Inactive Problems Resolved Problems Electronic Signature(s) Signed: 04/25/2018 2:16:45 PM  By: Lawanda Cousins Entered By: Lawanda Cousins on 04/25/2018 14:16:44 [name], [name] ([id number]) -------------------------------------------------------------------------------- Progress Note Details Patient Name: [name] Date of Service: 04/25/2018 1:45 PM Medical Record Number: [id number] Patient Account Number: [id number] Date of Birth/Sex: 03-Oct-1923 (84 y.o. 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 Dr. Amalia Hailey the podiatrist saw her on 07/20/2017 and sent her to the wound center for possible hyperbaric oxygen therapy. past medical history of peripheral vascular disease, varicose veins, status post appendectomy, basal cell carcinoma excision from the left leg, cholecystectomy, pacemaker placement, right lower extremity angiography done by Dr. dew in March 2017 with placement of a stent. there is also note of a successful ablation of the right small saphenous vein done which was reviewed by ultrasound on 10/24/2016. the patient had a right small saphenous vein ablation done on 10/20/2016. The patient has never been a smoker. She has been seen by Dr. Corene Cornea dew the vascular surgeon who most recently saw her on 06/15/2017 for evaluation of ongoing problems with right leg swelling. She had a lower extremity arterial duplex examination done(02/13/17) which showed patent distal right superficial femoral artery stent and above-the-knee popliteal stent without evidence of  restenosis. The ABI was more than 1.3 on the right and more than 1.3 on the left. This was consistent with noncompressible arteries due to medial calcification. The right great toe pressure and PPG waveforms are within normal  limits and the left great toe pressure and PPG waveforms are decreased. he recommended she continue to wear her compression stockings and continue with elevation. She is scheduled to have a noninvasive arterial study in the near future 08/16/2017 -- had a lower extremity arterial duplex examination done which showed patent distal right superficial femoral artery stent and above-the-knee popliteal stent without evidence of restenosis. The ABI was more than 1.3 on the right and more than 1.3 on the left. This was consistent with noncompressible arteries due to medial calcification. The right great toe pressure and PPG waveforms are within normal limits and the left great toe pressure and PPG waveforms are decreased. the x-ray of the right ankle has not yet been done 08/24/2017 -- had a right ankle x-ray -- IMPRESSION:1. No fracture, bone lesion or evidence of osteomyelitis. 2. Lateral soft tissue swelling with a soft tissue ulcer. she has not yet seen the vascular surgeon for review 08/31/17 on evaluation today patient's wound appears to be showing signs of improvement. She still with her appointment with vascular in order to review her results of her vascular study and then determine if any intervention would be recommended at that time. No fevers, chills, nausea, or vomiting noted at this time. She has been tolerating the dressing changes without complication. 09/28/17 on evaluation today patient's wound appears to show signs of good improvement in regard to the granulation tissue which is surfacing. There is still a layer of slough covering the wound and the posterior portion is still significantly deeper than the anterior nonetheless there has been some good sign of things  moving towards the better. She is going to go back to Dr. dew for reevaluation to ensure her blood flow is still appropriate. That will be before her next evaluation with [place] next week. No fevers, chills, nausea, or vomiting noted at this time. Patient does have some discomfort rated to be a 3-4/10 depending on activity specifically cleansing the wound makes it worse. 10/05/2017 -- the patient was seen by Dr. Lucky Cowboy last week and noninvasive studies showed a normal right ABI with brisk CATIA, TODOROV. ([id number]) triphasic waveforms consistent with no arterial insufficiency including normal digital pressures. The duplex showed a patent distal right SFA stent and the proximal SFA was also normal. He was pleased with her test and thought she should have enough of perfusion for normal wound healing. He would see her back in 6 months time. 12/21/17 on evaluation today patient appears to be doing fairly well in regard to her right lateral ankle wound. Unfortunately the main issue that she is expansion at this point is that she is having some issues with what appears to be some cellulitis in the right anterior shin. She has also been noting a little bit of uncomfortable feeling especially last night and her ankle area. I'm afraid that she made the developing a little bit of an infection. With that being said I think it is in the early stages. 12/28/17 on evaluation today patient's ankle appears to be doing excellent. She's making good progress at this point the cellulitis seems to have improved after last week's evaluation. Overall she is having no significant discomfort which is excellent news. She does have an appointment with Dr. dew on March 29, 2018 for reevaluation in regard to the stent he placed. She seems to have excellent blood flow in the right lower extremity. 01/19/12 on evaluation today patient's wound appears to be doing very well. In fact she does not  appear to require debridement at this  point, there's no evidence of infection, and overall from the standpoint of the wound she seems to be doing very well. With that being said I believe that it may be time to switch to different dressing away from the [facility] Dressing she tells me she does have a lot going on her friend actually passed away yesterday and she's also having a lot of issues with her husband this obviously is weighing heavy on her as far as your thoughts and concerns today. 01/25/18 on evaluation today patient appears to be doing fairly well in regard to her right lateral malleolus. She has been tolerating the dressing changes without complication. Overall I feel like this is definitely showing signs of improvement as far as how the overall appearance of the wound is there's also evidence of epithelium start to migrate over the granulation tissue. In general I think that she is progressing nicely as far as the wound is concerned. The only concern she really has is whether or not we can switch to every other week visits in order to avoid having as many appointments as her daughters have a difficult time getting her to her appointments as well as the patient's husband to his he is not doing very well at this point. 02/22/18 on evaluation today patient's right lateral malleolus ulcer appears to be doing great. She has been tolerating the dressing changes without complication. Overall you making excellent progress at this time. Patient is having no significant discomfort. 03/15/18 on evaluation today patient appears to be doing much more poorly in regard to her right lateral ankle ulcer at this point. Unfortunately since have last seen her her husband has passed just a few days ago is obviously weighed heavily on her her daughter also had surgery well she is with her today as usual. There does not appear to be any evidence of infection she does seem to have significant contusion/deep tissue injury to the right lateral  malleolus which was not noted previous when I saw her last. It's hard to tell of exactly when this injury occurred although during the time she was spending the night in the hospital this may have been most likely. 03/22/18 on evaluation today patient appears to actually be doing very well in regard to her ulcer. She did unfortunately have a setback which was noted last week however the good news is we seem to be getting back on track and in fact the wound in the core did still have some necrotic tissue which will be addressed at this point today but in general I'm seeing signs that things are on the up and up. She is glad to hear this obviously she's been somewhat concerned that due to the how her wound digressed more recently. 03/29/18 on evaluation today patient appears to be doing fairly well in regard to her right lower extremity lateral malleolus ulcer. She unfortunately does have a new area of pressure injury over the inferior portion where the wound has opened up a little bit larger secondary to the pressure she seems to be getting. She does tell me sometimes when she sleeps at night that it actually hurts and does seem to be pushing on the area little bit more unfortunately. There does not appear to be any evidence of infection which is good news. She has been tolerating the dressing changes without complication. She also did have some bruising in the left second and third toes due to the fact  that she may have bump this or injured it although she has neuropathy so she does not feel she did move recently that may have been where this came from. Nonetheless there does not appear to be any evidence of infection at this time. 04/12/18 on evaluation today patient's wound on the right lateral ankle actually appears to be doing a little bit better with a lot of necrotic docking tissue centrally loosening up in clearing away. However she does have the beginnings of a deep tissue injury on the left  lateral malleolus likely due to the fact we've been trying offload the right as much as we have. I think she may benefit from an assistive soft device to help with offloading and it looks like they're looking at one of the doughnut conditions that wraps around the lower leg to offload which I think will definitely do a good job. With that being said I think we definitely need to address this issue on the left before it becomes a wound. Patient is not having significant pain. [name], [name] ([id number]) 04/19/18 on evaluation today patient appears to be doing excellent in regard to the progress she's made with her right lateral ankle ulcer. The left ankle region which did show evidence of a deep tissue injury seems to be resolving there's little fluid noted underneath and a blister there's nothing open at this point in time overall I feel like this is progressing nicely which is good news. She does not seem to be having significant discomfort at this point which is also good news. 04/25/18-She is here in follow up evaluation for bilateral lateral malleolar ulcers. The right lateral malleolus ulcer with pale subcutaneous tissue exposure, central area of ulcer with tendon/periosteum exposed. The left lateral malleolus ulcer now with central area of nonviable tissue, otherwise deep tissue injury. She is wearing compression wraps to the left lower extremity, she will place the right lower extremity compression wraps on when she gets home. She will be out of town over the weekend and return next week and follow-up appointment. She completed her doxycycline this morning Patient History Information obtained from Patient. Family History Cancer - Father,Siblings, Heart Disease - Siblings, No family history of Diabetes, Hereditary Spherocytosis, Hypertension, Kidney Disease, Lung Disease, Seizures, Stroke, Thyroid Problems, Tuberculosis. Social History Never smoker, Marital Status - Married, Alcohol Use  - Never, Drug Use - No History, Caffeine Use - Rarely. Objective Constitutional Vitals Time Taken: 1:56 PM, Height: 65 in, Weight: 154.3 lbs, BMI: 25.7, Temperature: 98.0 F, Pulse: 92 bpm, Respiratory Rate: 16 breaths/min, Blood Pressure: 122/66 mmHg. Integumentary (Hair, Skin) Wound #1 status is Open. Original cause of wound was Gradually Appeared. The wound is located on the Right,Lateral Malleolus. The wound measures 2cm length x 1.7cm width x 0.6cm depth; 2.67cm^2 area and 1.602cm^3 volume. There is Fat Layer (Subcutaneous Tissue) Exposed exposed. There is no tunneling or undermining noted. There is a medium amount of serosanguineous drainage noted. The wound margin is distinct with the outline attached to the wound base. There is no granulation within the wound bed. There is a large (67-100%) amount of necrotic tissue within the wound bed including Adherent Slough. The periwound skin appearance exhibited: Rubor, Erythema. The periwound skin appearance did not exhibit: Callus, Crepitus, Excoriation, Induration, Rash, Scarring, Dry/Scaly, Maceration, Atrophie Blanche, Cyanosis, Ecchymosis, Hemosiderin Staining, Mottled, Pallor. The surrounding wound skin color is noted with erythema which is circumferential. Periwound temperature was noted as No Abnormality. The periwound has tenderness on palpation. Wound #2  status is Open. Original cause of wound was Gradually Appeared. The wound is located on the Left,Lateral Malleolus. The wound measures 0.7cm length x 0.7cm width x 0.1cm depth; 0.385cm^2 area and 0.038cm^3 volume. The wound is limited to skin breakdown. There is no tunneling or undermining noted. There is a medium amount of serous drainage noted. The wound margin is flat and intact. There is no granulation within the wound bed. There is a large (67-100%) amount of necrotic tissue within the wound bed including Eschar. 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 There was a Excisional Skin/Subcutaneous Tissue Debridement with a total area of 3.4 sq cm performed by Lawanda Cousins, NP. With the following instrument(s): Curette to remove Non-Viable tissue/material. Material removed includes Subcutaneous Tissue, Slough, and Biofilm after achieving pain control using Other (lidocaine 4%). No specimens were taken. A time out was conducted at 14:10, prior to the start of the procedure. A Minimum amount of bleeding was controlled with Pressure. The procedure was tolerated well with a pain level of 0 throughout and a pain level of 0 following the procedure. Patient s Level of Consciousness post procedure was recorded as Awake and Alert and post- procedure vitals were taken including Temperature: 98.0 F, Pulse: 92 bpm, Respiratory Rate: 16 breaths/min, Blood Pressure: (122)/(66) mmHg. Post Debridement Measurements: 2cm length x 1.7cm width x 0.6cm depth; 1.602cm^3 volume. Character of Wound/Ulcer Post Debridement is stable. Severity of Tissue Post Debridement is: Fat layer exposed. Post procedure Diagnosis Wound #1: Same as Pre-Procedure Plan Wound Cleansing: Wound #1 Right,Lateral Malleolus: Clean wound with Normal Saline. Cleanse wound with mild soap and water May Shower, gently pat wound dry prior to applying new dressing. Anesthetic (add to Medication List): Wound #1 Right,Lateral Malleolus: Topical Lidocaine 4% cream applied to wound bed prior to debridement (In Clinic Only). Primary Wound  Dressing: Wound #1 Right,Lateral Malleolus: Iodoflex Wound #2 Left,Lateral Malleolus: Iodoflex Secondary Dressing: Wound #1 Right,Lateral Malleolus: Boardered Foam Dressing Wound #2 Left,Lateral Malleolus: Boardered Foam Dressing Dressing Change Frequency: Wound #1 Right,Lateral Malleolus: Crite, [name] ([id number]) Change dressing every day. Wound #2 Left,Lateral Malleolus: Change dressing every day. Follow-up Appointments: Wound #1 Right,Lateral Malleolus: Return Appointment in 1 week. Wound #2 Left,Lateral Malleolus: Return Appointment in 1 week. Edema Control: Wound #1 Right,Lateral Malleolus: Elevate legs to the level of the heart and pump ankles as often as possible Wound #2 Left,Lateral Malleolus: Elevate legs to the level of the heart and pump ankles as often as possible Off-Loading: Wound #1 Right,Lateral Malleolus: Turn and reposition every 2 hours Other: - do not put pressure on your right ankle Wound #2 Left,Lateral Malleolus: Turn and reposition every 2 hours Other: - do not put pressure on your right ankle Additional Orders / Instructions: Wound #1 Right,Lateral Malleolus: Increase protein intake. Other: - Add Vitamin C, Zinc, Vitamin A to your diet Wound #2 Left,Lateral Malleolus: Increase protein intake. Other: - Add Vitamin C, Zinc, Vitamin A to your diet Electronic Signature(s) Signed: 04/25/2018 2:27:01 PM By: Lawanda Cousins Previous Signature: 04/25/2018 2:24:15 PM Version By: Lawanda Cousins Entered By: Lawanda Cousins on 04/25/2018 14:27:01 [name],  [name] ([id number]) -------------------------------------------------------------------------------- ROS/PFSH Details Patient Name: [name], [name] Date of Service: 04/25/2018 1:45 PM Medical Record Number: [id number] Patient Account Number: [id number] Date of Birth/Sex: [birth year] (84 y.o. F) Treating RN: Roger Shelter Primary Care Provider: FITZGERALD, DAVID Other Clinician: Referring  Provider: FITZGERALD, DAVID Treating Provider/Extender: Cathie Olden in Treatment: 46 Information Obtained From Patient Wound History Do you currently have one or more open woundso Yes How many open wounds do you currently haveo 1 Approximately how long have you had your woundso 2 yrs How have you been treating your wound(s) until nowo mupirocin, soaking in epsom salt Has your wound(s) ever healed and then re-openedo No Have you had any lab work done in the past montho No Have you tested positive for an antibiotic resistant organism (MRSA, VRE)o No Have you tested positive for osteomyelitis (bone infection)o No Have you had any tests for circulation on your legso Yes Who ordered the testo Dr. Lucky Cowboy [facility] the test [facility] Eyes Medical History: Positive for: Cataracts - surgery Cardiovascular Medical History: Positive for: Congestive Heart Failure; Hypertension Musculoskeletal Medical History: Positive for: Osteoarthritis Neurologic Medical History: Positive for: Neuropathy Oncologic Medical History: Negative for: Received Chemotherapy; Received Radiation HBO Extended History Items Eyes: Cataracts Immunizations Pneumococcal Vaccine: Received Pneumococcal Vaccination: Yes LUKE, RIG[place]EE ([id number]) Implantable Devices Family and Social History Cancer: Yes - Father,Siblings; Diabetes: No; Heart Disease: Yes - Siblings; Hereditary Spherocytosis: No; Hypertension: No; Kidney Disease: No; Lung Disease: No; Seizures: No; Stroke: No; Thyroid Problems: No; Tuberculosis: No; Never smoker; Marital Status - Married; Alcohol Use: Never; Drug Use: No History; Caffeine Use: Rarely; Financial Concerns: No; Food, Clothing or Shelter Needs: No; Support System Lacking: No; Transportation Concerns: No; Advanced Directives: No; Patient does not want information on Advanced Directives; Do not resuscitate: No; Living Will: Yes (Not Provided); Medical Power of Attorney:  No Physician Affirmation I have reviewed and agree with the above information. Electronic Signature(s) Signed: 04/25/2018 4:33:19 PM By: Roger Shelter Signed: 04/26/2018 8:17:02 AM By: Lawanda Cousins Entered By: Lawanda Cousins on 04/25/2018 14:20:34 [name], [name] ([id number]) -------------------------------------------------------------------------------- SuperBill Details Patient Name: [name] Date of Service: 04/25/2018 Medical Record Number: [id number] Patient Account Number: [id number] Date of Birth/Sex: [birth year] (84 y.o. 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 I89.0 Lymphedema, not elsewhere classified B35.4 Tinea corporis L89.520 Pressure ulcer of left ankle, unstageable Facility Procedures CPT4 Code: [id number] Description: 17616 - DEB SUBQ TISSUE 20 SQ CM/< ICD-10 Diagnosis Description W73.710 Non-pressure chronic ulcer of right ankle with unspecified L89.520 Pressure ulcer of left ankle, unstageable Modifier: severity Quantity: 1 Physician Procedures CPT4 Code: [id number] Description: 46270 - WC PHYS SUBQ TISS 20 SQ CM ICD-10 Diagnosis Description J50.093 Non-pressure chronic ulcer of right ankle with unspecified s L89.520 Pressure ulcer of left ankle, unstageable Modifier: everity Quantity: 1 Electronic Signature(s) Signed: 04/25/2018 2:24:59 PM By: Lawanda Cousins Entered By: Lawanda Cousins on 04/25/2018 14:24:59

## 2018-05-01 NOTE — Progress Notes (Signed)
TAILER, VOLKERT ([id number]) Visit Report for 04/12/2018 Arrival Information Details Patient Name: [name], [name] Date of Service: 04/12/2018 11:15 AM Medical Record Number: [id number] Patient Account Number: [id number] Date of Birth/Sex: 01-12-1923 (84 y.o. F) Treating RN: Roger Shelter Primary Care Ayanni Tun: FITZGERALD, DAVID Other Clinician: Referring Raigan Baria: FITZGERALD, DAVID Treating Cornellius Kropp/Extender: STONE III, HOYT Weeks in Treatment: 73 Visit Information History Since Last Visit All ordered tests and consults were completed: No Patient Arrived: Ambulatory Added or deleted any medications: No Arrival Time: 11:18 Any new allergies or adverse reactions: No Accompanied By: daughter Had a fall or experienced change in No Transfer Assistance: None activities of daily living that may affect Patient Identification Verified: Yes risk of falls: Secondary Verification Process Completed: Yes Signs or symptoms of abuse/neglect since last visito No Patient Requires Transmission-Based No Hospitalized since last visit: No Precautions: Implantable device outside of the clinic excluding No Patient Has Alerts: No cellular tissue based products placed in the center since last visit: Pain Present Now: No Electronic Signature(s) Signed: 04/12/2018 3:34:23 PM By: Roger Shelter Entered By: Roger Shelter on 04/12/2018 11:19:05 Jeudy, [name] ([id number]) -------------------------------------------------------------------------------- Encounter Discharge Information Details Patient Name: [name] Date of Service: 04/12/2018 11:15 AM Medical Record Number: [id number] Patient Account Number: [id number] Date of Birth/Sex: [birth year] (84 y.o. F) Treating RN: Primary Care Javonne Louissaint: FITZGERALD, DAVID Other Clinician: Referring Jaysean Manville: FITZGERALD, DAVID Treating Cesia Orf/Extender: STONE III, HOYT Weeks in Treatment: 35 Encounter Discharge Information  Items Discharge Condition: Stable Ambulatory Status: Ambulatory Discharge Destination: Home Transportation: Private Auto Accompanied By: daughter Schedule Follow-up Appointment: Yes Clinical Summary of Care: Electronic Signature(s) Signed: 05/01/2018 7:45:54 AM By: Harold Barban Entered By: Harold Barban on 04/12/2018 12:03:43 Skaggs, [name] ([id number]) -------------------------------------------------------------------------------- Lower Extremity Assessment Details Patient Name: [name] Date of Service: 04/12/2018 11:15 AM Medical Record Number: [id number] Patient Account Number: [id number] Date of Birth/Sex: [birth year] (84 y.o. F) Treating RN: Roger Shelter Primary Care Tatjana Turcott: FITZGERALD, DAVID Other Clinician: Referring Sheryl Saintil: FITZGERALD, DAVID Treating Fredna Stricker/Extender: STONE III, HOYT Weeks in Treatment: 35 Edema Assessment Assessed: [Left: No] [Right: No] Edema: [Left: Ye] [Right: s] Vascular Assessment Claudication: Claudication Assessment [Right:None] Pulses: Dorsalis Pedis Palpable: [Right:Yes] Posterior Tibial Extremity colors, hair growth, and conditions: Extremity Color: [Right:Normal] Hair Growth on Extremity: [Right:No] Temperature of Extremity: [Right:Warm] Capillary Refill: [Right:< 3 seconds] Toe Nail Assessment Left: Right: Thick: Yes Discolored: Yes Deformed: No Improper Length and Hygiene: Yes Electronic Signature(s) Signed: 04/12/2018 3:34:23 PM By: Roger Shelter Entered By: Roger Shelter on 04/12/2018 11:24:37 Fare, [name] ([id number]) -------------------------------------------------------------------------------- Multi Wound Chart Details Patient Name: [name] Date of Service: 04/12/2018 11:15 AM Medical Record Number: [id number] Patient Account Number: [id number] Date of Birth/Sex: May 14, 1923 (84 y.o. F) Treating RN: Montey Hora Primary Care Akyia Borelli: FITZGERALD, DAVID Other  Clinician: Referring Whittley Carandang: FITZGERALD, DAVID Treating [name] [name]/Extender: STONE III, HOYT Weeks in Treatment: 35 Vital Signs Height(in): 65 Pulse(bpm): 91 Weight(lbs): 154.3 Blood Pressure(mmHg): 120/70 Body Mass Index(BMI): 26 Temperature(F): 98.1 Respiratory Rate 16 (breaths/min): Photos: [1:No Photos] [N/A:N/A] Wound Location: [1:Right Malleolus - Lateral] [N/A:N/A] Wounding Event: [1:Gradually Appeared] [N/A:N/A] Primary Etiology: [1:Lymphedema] [N/A:N/A] Secondary Etiology: [1:Arterial Insufficiency Ulcer] [N/A:N/A] Comorbid History: [1:Cataracts, Congestive Heart Failure, Hypertension, Osteoarthritis, Neuropathy] [N/A:N/A] Date Acquired: [1:08/11/2015] [N/A:N/A] Weeks of Treatment: [1:35] [N/A:N/A] Wound Status: [1:Open] [N/A:N/A] Measurements L x W x D [1:2x2x0.5] [N/A:N/A] (cm) Area (cm) : [1:3.142] [N/A:N/A] Volume (cm) : [1:1.571] [N/A:N/A] % Reduction in Area: [1:-105.10%] [N/A:N/A] % Reduction in Volume: [1:-242.30%] [N/A:N/A] Classification: [1:Full Thickness Without Exposed Support Structures] [N/A:N/A] Exudate Amount: [  1:Large] [N/A:N/A] Exudate Type: [1:Serosanguineous] [N/A:N/A] Exudate Color: [1:red, brown] [N/A:N/A] Wound Margin: [1:Distinct, outline attached] [N/A:N/A] Granulation Amount: [1:None Present (0%)] [N/A:N/A] Necrotic Amount: [1:Large (67-100%)] [N/A:N/A] Necrotic Tissue: [1:Eschar, Adherent Slough] [N/A:N/A] Exposed Structures: [1:Fat Layer (Subcutaneous Tissue) Exposed: Yes] [N/A:N/A] Epithelialization: [1:None] [N/A:N/A] Periwound Skin Texture: [1:Excoriation: Yes Induration: No Callus: No Crepitus: No Rash: No Scarring: No] [N/A:N/A] Periwound Skin Moisture: [1:Maceration: Yes Dry/Scaly: No] [N/A:N/A] Periwound Skin Color: Erythema: Yes N/A N/A Rubor: Yes Atrophie Blanche: No Cyanosis: No Ecchymosis: No Hemosiderin Staining: No Mottled: No Pallor: No Erythema Location: Circumferential N/A N/A Temperature: No Abnormality  N/A N/A Tenderness on Palpation: Yes N/A N/A Wound Preparation: Ulcer Cleansing: N/A N/A Rinsed/Irrigated with Saline Topical Anesthetic Applied: Other: lidocaine 4% Treatment Notes Electronic Signature(s) Signed: 04/12/2018 4:42:41 PM By: Montey Hora Entered By: Montey Hora on 04/12/2018 11:42:21 Rondinelli, [name] ([id number]) -------------------------------------------------------------------------------- [place] Details Patient Name: [name] Date of Service: 04/12/2018 11:15 AM Medical Record Number: [id number] Patient Account Number: [id number] Date of Birth/Sex: 1923-03-25 (84 y.o. F) Treating RN: Montey Hora Primary Care Anayla Giannetti: FITZGERALD, DAVID Other Clinician: Referring Nataliee Shurtz: FITZGERALD, DAVID Treating Antonius Hartlage/Extender: STONE III, HOYT Weeks in Treatment: 35 Active Inactive ` Abuse / Safety / Falls / Self Care Management Nursing Diagnoses: Potential for falls Goals: Patient will not experience any injury related to falls Date Initiated: 08/10/2017 Target Resolution Date: 11/10/2017 Goal Status: Active Interventions: Assess Activities of Daily Living upon admission and as needed Assess fall risk on admission and as needed Assess: immobility, friction, shearing, incontinence upon admission and as needed Notes: ` Nutrition Nursing Diagnoses: Imbalanced nutrition Potential for alteratiion in Nutrition/Potential for imbalanced nutrition Goals: Patient/caregiver agrees to and verbalizes understanding of need to use nutritional supplements and/or vitamins as prescribed Date Initiated: 08/10/2017 Target Resolution Date: 12/08/2017 Goal Status: Active Interventions: Assess patient nutrition upon admission and as needed per policy Notes: ` Orientation to the Wound Care Program Nursing Diagnoses: Knowledge deficit related to the wound healing center program Goals: Patient/caregiver will verbalize understanding of the [place] Program Date Initiated: 08/10/2017 Target Resolution Date: 09/08/2017 JAVONDA, SUH ([id number]) Goal Status: Active Interventions: Provide education on orientation to the wound center Notes: ` Pain, Acute or Chronic Nursing Diagnoses: Pain, acute or chronic: actual or potential Potential alteration in comfort, pain Goals: Patient/caregiver will verbalize adequate pain control between visits Date Initiated: 08/10/2017 Target Resolution Date: 12/08/2017 Goal Status: Active Interventions: Complete pain assessment as per visit requirements Notes: ` Wound/Skin Impairment Nursing Diagnoses: Impaired tissue integrity Knowledge deficit related to ulceration/compromised skin integrity Goals: Ulcer/skin breakdown will have a volume reduction of 80% by week 12 Date Initiated: 08/10/2017 Target Resolution Date: 12/01/2017 Goal Status: Active Interventions: Assess patient/caregiver ability to perform ulcer/skin care regimen upon admission and as needed Notes: Electronic Signature(s) Signed: 04/12/2018 4:42:41 PM By: Montey Hora Entered By: Montey Hora on 04/12/2018 11:42:03 Wendt, [name] ([id number]) -------------------------------------------------------------------------------- Pain Assessment Details Patient Name: [name] Date of Service: 04/12/2018 11:15 AM Medical Record Number: [id number] Patient Account Number: [id number] Date of Birth/Sex: 12-22-1922 (84 y.o. F) Treating RN: Roger Shelter Primary Care Willean Schurman: FITZGERALD, DAVID Other Clinician: Referring Yocelyn Brocious: FITZGERALD, DAVID Treating Ludean Duhart/Extender: STONE III, HOYT Weeks in Treatment: 35 Active Problems Location of Pain Severity and Description of Pain Patient Has Paino No Site Locations Pain Management and Medication Current Pain Management: Electronic Signature(s) Signed: 04/12/2018 3:34:23 PM By: Roger Shelter Entered By: Roger Shelter on 04/12/2018  11:19:11 Rosendahl, [name] ([id number]) -------------------------------------------------------------------------------- Patient/Caregiver Education Details Patient Name:  Rupnow, [name] Date of Service: 04/12/2018 11:15 AM Medical Record Number: [id number] Patient Account Number: [id number] Date of Birth/Gender: [birth year] (84 y.o. F) Treating RN: Primary Care Physician: FITZGERALD, DAVID Other Clinician: Referring Physician: FITZGERALD, DAVID Treating Physician/Extender: Melburn Hake, HOYT Weeks in Treatment: 35 Education Assessment Education Provided To: Patient Education Topics Provided Wound/Skin Impairment: Handouts: Caring for Your Ulcer Methods: Explain/Verbal Responses: State content correctly Electronic Signature(s) Signed: 05/01/2018 7:45:54 AM By: Harold Barban Entered By: Harold Barban on 04/12/2018 12:04:01 [name] ([id number]) -------------------------------------------------------------------------------- Wound Assessment Details Patient Name: [name] Date of Service: 04/12/2018 11:15 AM Medical Record Number: [id number] Patient Account Number: [id number] Date of Birth/Sex: 12-22-1922 (84 y.o. F) Treating RN: Roger Shelter Primary Care Ruston Fedora: FITZGERALD, DAVID Other Clinician: Referring Garey Alleva: FITZGERALD, DAVID Treating Arnesha Schiraldi/Extender: STONE III, HOYT Weeks in Treatment: 35 Wound Status Wound Number: 1 Primary Lymphedema Etiology: Wound Location: Right Malleolus - Lateral Secondary Arterial Insufficiency Ulcer Wounding Event: Gradually Appeared Etiology: Date Acquired: 08/11/2015 Wound Status: Open Weeks Of Treatment: 35 Comorbid Cataracts, Congestive Heart Failure, Clustered Wound: No History: Hypertension, Osteoarthritis, Neuropathy Photos Photo Uploaded By: Roger Shelter on 04/12/2018 15:30:24 Wound Measurements Length: (cm) 2 Width: (cm) 2 Depth: (cm) 0.5 Area: (cm) 3.142 Volume: (cm) 1.571 % Reduction  in Area: -105.1% % Reduction in Volume: -242.3% Epithelialization: None Tunneling: No Undermining: No Wound Description Full Thickness Without Exposed Support Classification: Structures Wound Margin: Distinct, outline attached Exudate Large Amount: Exudate Type: Serosanguineous Exudate Color: red, brown Foul Odor After Cleansing: No Slough/Fibrino Yes Wound Bed Granulation Amount: None Present (0%) Exposed Structure Necrotic Amount: Large (67-100%) Fat Layer (Subcutaneous Tissue) Exposed: Yes Necrotic Quality: Eschar, Adherent Slough Periwound Skin Texture Texture Color No Abnormalities Noted: No No Abnormalities Noted: No Passey, [name] ([id number]) Callus: No Atrophie Blanche: No Crepitus: No Cyanosis: No Excoriation: Yes Ecchymosis: No Induration: No Erythema: Yes Rash: No Erythema Location: Circumferential Scarring: No Hemosiderin Staining: No Mottled: No Moisture Pallor: No No Abnormalities Noted: No Rubor: Yes Dry / Scaly: No Maceration: Yes Temperature / Pain Temperature: No Abnormality Tenderness on Palpation: Yes Wound Preparation Ulcer Cleansing: Rinsed/Irrigated with Saline Topical Anesthetic Applied: Other: lidocaine 4%, Electronic Signature(s) Signed: 04/12/2018 3:34:23 PM By: Roger Shelter Entered By: Roger Shelter on 04/12/2018 11:25:19 Lanphear, [name] ([id number]) -------------------------------------------------------------------------------- Vitals Details Patient Name: [name] Date of Service: 04/12/2018 11:15 AM Medical Record Number: [id number] Patient Account Number: [id number] Date of Birth/Sex: 12-Mar-1923 (84 y.o. F) Treating RN: Roger Shelter Primary Care Nevaya Nagele: FITZGERALD, DAVID Other Clinician: Referring Wayland Baik: FITZGERALD, DAVID Treating Dionisios Ricci/Extender: STONE III, HOYT Weeks in Treatment: 35 Vital Signs Time Taken: 11:19 Temperature (F): 98.1 Height (in): 65 Pulse (bpm): 91 Weight  (lbs): 154.3 Respiratory Rate (breaths/min): 16 Body Mass Index (BMI): 25.7 Blood Pressure (mmHg): 120/70 Reference Range: 80 - 120 mg / dl Electronic Signature(s) Signed: 04/12/2018 3:34:23 PM By: Roger Shelter Entered By: Roger Shelter on 04/12/2018 11:19:33

## 2018-05-03 ENCOUNTER — Encounter: Payer: Medicare Other | Admitting: Physician Assistant

## 2018-05-03 DIAGNOSIS — I70233 Atherosclerosis of native arteries of right leg with ulceration of ankle: Secondary | ICD-10-CM | POA: Diagnosis not present

## 2018-05-05 NOTE — Progress Notes (Signed)
[name], [name] ([id number]) Visit Report for 05/03/2018 Arrival Information Details Patient Name: [name], [name] Date of Service: 05/03/2018 11:00 AM Medical Record Number: [id number] Patient Account Number: [id number] Date of Birth/Sex: Aug 15, 1923 (84 y.o. F) Treating RN: Cornell Barman Primary Care Jozie Wulf: FITZGERALD, DAVID Other Clinician: Referring Lissett Favorite: FITZGERALD, DAVID Treating Merryl Buckels/Extender: STONE III, HOYT Weeks in Treatment: 11 Visit Information History Since Last Visit Added or deleted any medications: No Patient Arrived: Ambulatory Any new allergies or adverse reactions: No Arrival Time: 11:06 Had a fall or experienced change in No Accompanied By: daughter in activities of daily living that may affect lobby risk of falls: Transfer Assistance: None Signs or symptoms of abuse/neglect since last visito No Patient Identification Verified: Yes Hospitalized since last visit: No Secondary Verification Process Completed: Yes Implantable device outside of the clinic excluding No Patient Requires Transmission-Based No cellular tissue based products placed in the center Precautions: since last visit: Patient Has Alerts: No Has Dressing in Place as Prescribed: Yes Has Footwear/Offloading in Place as Prescribed: Yes Left: Regular Shoe Right: Regular Shoe Pain Present Now: No Electronic Signature(s) Signed: 05/03/2018 5:30:19 PM By: Gretta Cool, BSN, RN, CWS, Kim RN, BSN Entered By: Gretta Cool, BSN, RN, CWS, Kim on 05/03/2018 11:07:23 Axel, [name] ([id number]) -------------------------------------------------------------------------------- Encounter Discharge Information Details Patient Name: [name] Date of Service: 05/03/2018 11:00 AM Medical Record Number: [id number] Patient Account Number: [id number] Date of Birth/Sex: 06-Mar-1923 (84 y.o. F) Treating RN: Ahmed Prima Primary Care Alizae Bechtel: FITZGERALD, DAVID Other Clinician: Referring Mozella Rexrode:  FITZGERALD, DAVID Treating Yoandri Congrove/Extender: STONE III, HOYT Weeks in Treatment: 53 Encounter Discharge Information Items Discharge Condition: Stable Ambulatory Status: Ambulatory Discharge Destination: Home Transportation: Private Auto Accompanied By: daughter Schedule Follow-up Appointment: Yes Clinical Summary of Care: Electronic Signature(s) Signed: 05/03/2018 12:04:06 PM By: Alric Quan Entered By: Alric Quan on 05/03/2018 12:04:06 Rosasco, [name] ([id number]) -------------------------------------------------------------------------------- Lower Extremity Assessment Details Patient Name: [name] Date of Service: 05/03/2018 11:00 AM Medical Record Number: [id number] Patient Account Number: [id number] Date of Birth/Sex: [birth year] (84 y.o. F) Treating RN: Cornell Barman Primary Care Eleaner Dibartolo: FITZGERALD, DAVID Other Clinician: Referring Kinsie Belford: FITZGERALD, DAVID Treating Sanuel Ladnier/Extender: STONE III, HOYT Weeks in Treatment: 38 Vascular Assessment Pulses: Dorsalis Pedis Palpable: [Left:Yes] [Right:Yes] Posterior Tibial Extremity colors, hair growth, and conditions: Hair Growth on Extremity: [Left:No] [Right:No] Temperature of Extremity: [Left:Warm] [Right:Warm] Capillary Refill: [Left:< 3 seconds] [Right:< 3 seconds] Toe Nail Assessment Left: Right: Thick: Yes Yes Discolored: Yes Yes Deformed: Yes Yes Improper Length and Hygiene: No No Electronic Signature(s) Signed: 05/03/2018 5:30:19 PM By: Gretta Cool, BSN, RN, CWS, Kim RN, BSN Entered By: Gretta Cool, BSN, RN, CWS, Kim on 05/03/2018 11:20:38 Melander, [name] ([id number]) -------------------------------------------------------------------------------- Multi Wound Chart Details Patient Name: [name] Date of Service: 05/03/2018 11:00 AM Medical Record Number: [id number] Patient Account Number: [id number] Date of Birth/Sex: [birth year] (84 y.o. F) Treating RN: Montey Hora Primary Care  Seleen Walter: FITZGERALD, DAVID Other Clinician: Referring Dajsha Massaro: FITZGERALD, DAVID Treating Lottie Sigman/Extender: STONE III, HOYT Weeks in Treatment: 38 Vital Signs Height(in): 65 Pulse(bpm): 83 Weight(lbs): 154.3 Blood Pressure(mmHg): 120/67 Body Mass Index(BMI): 26 Temperature(F): 98.1 Respiratory Rate 16 (breaths/min): Photos: [1:No Photos] [2:No Photos] [N/A:N/A] Wound Location: [1:Right Malleolus - Lateral] [2:Left Malleolus - Lateral] [N/A:N/A] Wounding Event: [1:Gradually Appeared] [2:Gradually Appeared] [N/A:N/A] Primary Etiology: [1:Lymphedema] [2:Pressure Ulcer] [N/A:N/A] Secondary Etiology: [1:Arterial Insufficiency Ulcer] [2:N/A] [N/A:N/A] Comorbid History: [1:Cataracts, Congestive Heart Failure, Hypertension, Osteoarthritis, Neuropathy] [2:Cataracts, Congestive Heart Failure, Hypertension, Osteoarthritis, Neuropathy] [N/A:N/A] Date Acquired: [1:08/11/2015] [2:04/12/2018] [N/A:N/A] Weeks of Treatment: [1:38] [2:2] [N/A:N/A] Wound Status: [  1:Open] [2:Open] [N/A:N/A] Measurements L x W x D [1:2x1.8x0.5] [2:1.2x0.9x0.1] [N/A:N/A] (cm) Area (cm) : [1:2.827] [2:0.848] [N/A:N/A] Volume (cm) : [1:1.414] [2:0.085] [N/A:N/A] % Reduction in Area: [1:-84.50%] [2:-8.00%] [N/A:N/A] % Reduction in Volume: [1:-208.10%] [2:-7.60%] [N/A:N/A] Classification: [1:Full Thickness Without Exposed Support Structures] [2:Category/Stage II] [N/A:N/A] Exudate Amount: [1:Medium] [2:Medium] [N/A:N/A] Exudate Type: [1:Serosanguineous] [2:Serous] [N/A:N/A] Exudate Color: [1:red, brown] [2:amber] [N/A:N/A] Wound Margin: [1:Distinct, outline attached] [2:Flat and Intact] [N/A:N/A] Granulation Amount: [1:None Present (0%)] [2:None Present (0%)] [N/A:N/A] Necrotic Amount: [1:Large (67-100%)] [2:Large (67-100%)] [N/A:N/A] Necrotic Tissue: [1:Adherent Slough] [2:Eschar] [N/A:N/A] Exposed Structures: [1:Fat Layer (Subcutaneous Tissue) Exposed: Yes] [2:Fascia: No Fat Layer (Subcutaneous Tissue) Exposed:  No Tendon: No Muscle: No Joint: No Bone: No Limited to Skin Breakdown] [N/A:N/A] Epithelialization: [1:None] [2:None] [N/A:N/A] Periwound Skin Texture: [1:Excoriation: No Induration: No] [2:Excoriation: No Induration: No] [N/A:N/A] Callus: No Callus: No Crepitus: No Crepitus: No Rash: No Rash: No Scarring: No Scarring: No Periwound Skin Moisture: Maceration: No Maceration: No N/A Dry/Scaly: No Dry/Scaly: No Periwound Skin Color: Erythema: Yes Atrophie Blanche: No N/A Rubor: Yes Cyanosis: No Atrophie Blanche: No Ecchymosis: No Cyanosis: No Erythema: No Ecchymosis: No Hemosiderin Staining: No Hemosiderin Staining: No Mottled: No Mottled: No Pallor: No Pallor: No Rubor: No Erythema Location: Circumferential N/A N/A Temperature: No Abnormality No Abnormality N/A Tenderness on Palpation: Yes No N/A Wound Preparation: Ulcer Cleansing: Ulcer Cleansing: N/A Rinsed/Irrigated with Saline Rinsed/Irrigated with Saline Topical Anesthetic Applied: Topical Anesthetic Applied: Other: lidocaine 4% Other: lidocaine 4% Treatment Notes Electronic Signature(s) Signed: 05/03/2018 5:45:37 PM By: Montey Hora Entered By: Montey Hora on 05/03/2018 11:43:31 Boulos, [name] ([id number]) -------------------------------------------------------------------------------- [place] Details Patient Name: [name] Date of Service: 05/03/2018 11:00 AM Medical Record Number: [id number] Patient Account Number: [id number] Date of Birth/Sex: 1923/05/23 (84 y.o. F) Treating RN: Montey Hora Primary Care Nakeem Murnane: FITZGERALD, DAVID Other Clinician: Referring Trayonna Bachmeier: FITZGERALD, DAVID Treating Blaike Newburn/Extender: STONE III, HOYT Weeks in Treatment: 27 Active Inactive ` Abuse / Safety / Falls / Self Care Management Nursing Diagnoses: Potential for falls Goals: Patient will not experience any injury related to falls Date Initiated: 08/10/2017 Target  Resolution Date: 11/10/2017 Goal Status: Active Interventions: Assess Activities of Daily Living upon admission and as needed Assess fall risk on admission and as needed Assess: immobility, friction, shearing, incontinence upon admission and as needed Notes: ` Nutrition Nursing Diagnoses: Imbalanced nutrition Potential for alteratiion in Nutrition/Potential for imbalanced nutrition Goals: Patient/caregiver agrees to and verbalizes understanding of need to use nutritional supplements and/or vitamins as prescribed Date Initiated: 08/10/2017 Target Resolution Date: 12/08/2017 Goal Status: Active Interventions: Assess patient nutrition upon admission and as needed per policy Notes: ` Orientation to the Wound Care Program Nursing Diagnoses: Knowledge deficit related to the wound healing center program Goals: Patient/caregiver will verbalize understanding of the [facility] Date Initiated: 08/10/2017 Target Resolution Date: 09/08/2017 TAMIKI, KUBA ([id number]) Goal Status: Active Interventions: Provide education on orientation to the wound center Notes: ` Pain, Acute or Chronic Nursing Diagnoses: Pain, acute or chronic: actual or potential Potential alteration in comfort, pain Goals: Patient/caregiver will verbalize adequate pain control between visits Date Initiated: 08/10/2017 Target Resolution Date: 12/08/2017 Goal Status: Active Interventions: Complete pain assessment as per visit requirements Notes: ` Wound/Skin Impairment Nursing Diagnoses: Impaired tissue integrity Knowledge deficit related to ulceration/compromised skin integrity Goals: Ulcer/skin breakdown will have a volume reduction of 80% by week 12 Date Initiated: 08/10/2017 Target Resolution Date: 12/01/2017 Goal Status: Active Interventions: Assess patient/caregiver ability to perform ulcer/skin care regimen upon admission and  as needed Notes: Electronic Signature(s) Signed: 05/03/2018  5:45:37 PM By: Montey Hora Entered By: Montey Hora on 05/03/2018 11:41:34 Wall, [name] ([id number]) -------------------------------------------------------------------------------- Pain Assessment Details Patient Name: [name] Date of Service: 05/03/2018 11:00 AM Medical Record Number: [id number] Patient Account Number: [id number] Date of Birth/Sex: [birth year] (84 y.o. F) Treating RN: Cornell Barman Primary Care Calyx Hawker: FITZGERALD, DAVID Other Clinician: Referring Letizia Hook: FITZGERALD, DAVID Treating Thayne Cindric/Extender: STONE III, HOYT Weeks in Treatment: 38 Active Problems Location of Pain Severity and Description of Pain Patient Has Paino No Site Locations With Dressing Change: No Pain Management and Medication Current Pain Management: Electronic Signature(s) Signed: 05/03/2018 5:30:19 PM By: Gretta Cool, BSN, RN, CWS, Kim RN, BSN Entered By: Gretta Cool, BSN, RN, CWS, Kim on 05/03/2018 11:07:31 Zuelke, [name] ([id number]) -------------------------------------------------------------------------------- Patient/Caregiver Education Details Patient Name: [name] Date of Service: 05/03/2018 11:00 AM Medical Record Number: [id number] Patient Account Number: [id number] Date of Birth/Gender: 08-20-1923 (84 y.o. F) Treating RN: Ahmed Prima Primary Care Physician: FITZGERALD, DAVID Other Clinician: Referring Physician: FITZGERALD, DAVID Treating Physician/Extender: Melburn Hake, HOYT Weeks in Treatment: 93 Education Assessment Education Provided To: Patient Education Topics Provided Wound/Skin Impairment: Handouts: Caring for Your Ulcer, Skin Care Do's and Dont's, Other: change dressing as ordered Methods: Demonstration, Explain/Verbal Responses: State content correctly Electronic Signature(s) Signed: 05/03/2018 5:38:20 PM By: Alric Quan Entered By: Alric Quan on 05/03/2018 12:04:27 Delucchi, [name]  ([id number]) -------------------------------------------------------------------------------- Wound Assessment Details Patient Name: [name] Date of Service: 05/03/2018 11:00 AM Medical Record Number: [id number] Patient Account Number: [id number] Date of Birth/Sex: [birth year] (84 y.o. F) Treating RN: Cornell Barman Primary Care Aric Jost: FITZGERALD, DAVID Other Clinician: Referring Audryana Hockenberry: FITZGERALD, DAVID Treating Levora Werden/Extender: STONE III, HOYT Weeks in Treatment: 38 Wound Status Wound Number: 1 Primary Lymphedema Etiology: Wound Location: Right Malleolus - Lateral Secondary Arterial Insufficiency Ulcer Wounding Event: Gradually Appeared Etiology: Date Acquired: 08/11/2015 Wound Status: Open Weeks Of Treatment: 38 Comorbid Cataracts, Congestive Heart Failure, Clustered Wound: No History: Hypertension, Osteoarthritis, Neuropathy Photos Photo Uploaded By: Gretta Cool, BSN, RN, CWS, Kim on 05/03/2018 16:37:01 Wound Measurements Length: (cm) 2 Width: (cm) 1.8 Depth: (cm) 0.5 Area: (cm) 2.827 Volume: (cm) 1.414 % Reduction in Area: -84.5% % Reduction in Volume: -208.1% Epithelialization: None Tunneling: No Undermining: No Wound Description Full Thickness With Exposed Support Classification: Structures Wound Margin: Distinct, outline attached Exudate Medium Amount: Exudate Type: Serosanguineous Exudate Color: red, brown Foul Odor After Cleansing: No Slough/Fibrino Yes Wound Bed Granulation Amount: None Present (0%) Exposed Structure Necrotic Amount: Large (67-100%) Fascia Exposed: Yes Necrotic Quality: Adherent Slough Fat Layer (Subcutaneous Tissue) Exposed: Yes Periwound Skin Texture Texture Color No Abnormalities Noted: No No Abnormalities Noted: No Harr, [name] ([id number]) Callus: No Atrophie Blanche: No Crepitus: No Cyanosis: No Excoriation: No Ecchymosis: No Induration: No Erythema: Yes Rash: No Erythema Location:  Circumferential Scarring: No Hemosiderin Staining: No Mottled: No Moisture Pallor: No No Abnormalities Noted: No Rubor: Yes Dry / Scaly: No Maceration: No Temperature / Pain Temperature: No Abnormality Tenderness on Palpation: Yes Wound Preparation Ulcer Cleansing: Rinsed/Irrigated with Saline Topical Anesthetic Applied: Other: lidocaine 4%, Treatment Notes Wound #1 (Right, Lateral Malleolus) 1. Cleansed with: Clean wound with Normal Saline 2. Anesthetic Topical Lidocaine 4% cream to wound bed prior to debridement 3. Peri-wound Care: Skin Prep 4. Dressing Applied: Iodoflex 5. Secondary Dressing Applied Bordered Foam Dressing Electronic Signature(s) Signed: 05/03/2018 5:30:19 PM By: Gretta Cool, BSN, RN, CWS, Kim RN, BSN Signed: 05/03/2018 5:45:37 PM By: Montey Hora Entered By: Montey Hora on 05/03/2018 11:51:28 Kagel, Hulan Amato  Darnell Level ([id number]) -------------------------------------------------------------------------------- Wound Assessment Details Patient Name: JAMILETT, FERRANTE. Date of Service: 05/03/2018 11:00 AM Medical Record Number: [id number] Patient Account Number: [id number] Date of Birth/Sex: November 15, 1923 (84 y.o. F) Treating RN: Cornell Barman Primary Care Jalien Weakland: FITZGERALD, DAVID Other Clinician: Referring Rosalin Buster: FITZGERALD, DAVID Treating Aleksandr Pellow/Extender: STONE III, HOYT Weeks in Treatment: 38 Wound Status Wound Number: 2 Primary Pressure Ulcer Etiology: Wound Location: Left Malleolus - Lateral Wound Open Wounding Event: Gradually Appeared Status: Date Acquired: 04/12/2018 Comorbid Cataracts, Congestive Heart Failure, Weeks Of Treatment: 2 History: Hypertension, Osteoarthritis, Neuropathy Clustered Wound: No Photos Photo Uploaded By: Gretta Cool, BSN, RN, CWS, Kim on 05/03/2018 16:37:02 Wound Measurements Length: (cm) 1.2 Width: (cm) 0.9 Depth: (cm) 0.1 Area: (cm) 0.848 Volume: (cm) 0.085 % Reduction in Area: -8% % Reduction in Volume:  -7.6% Epithelialization: None Tunneling: No Undermining: No Wound Description Classification: Category/Stage II Foul Odor Wound Margin: Flat and Intact Slough/Fi Exudate Amount: Medium Exudate Type: Serous Exudate Color: amber After Cleansing: No brino No Wound Bed Granulation Amount: None Present (0%) Exposed Structure Necrotic Amount: Large (67-100%) Fascia Exposed: No Necrotic Quality: Eschar Fat Layer (Subcutaneous Tissue) Exposed: No Tendon Exposed: No Muscle Exposed: No Joint Exposed: No Bone Exposed: No Limited to Skin Breakdown Periwound Skin Texture [place]anga, [name] ([id number]) Texture Color No Abnormalities Noted: No No Abnormalities Noted: No Callus: No Atrophie Blanche: No Crepitus: No Cyanosis: No Excoriation: No Ecchymosis: No Induration: No Erythema: No Rash: No Hemosiderin Staining: No Scarring: No Mottled: No Pallor: No Moisture Rubor: No No Abnormalities Noted: No Dry / Scaly: No Temperature / Pain Maceration: No Temperature: No Abnormality Wound Preparation Ulcer Cleansing: Rinsed/Irrigated with Saline Topical Anesthetic Applied: Other: lidocaine 4%, Treatment Notes Wound #2 (Left, Lateral Malleolus) 1. Cleansed with: Clean wound with Normal Saline 2. Anesthetic Topical Lidocaine 4% cream to wound bed prior to debridement 3. Peri-wound Care: Skin Prep 4. Dressing Applied: Prisma Ag 5. Secondary Dressing Applied Bordered Foam Dressing Electronic Signature(s) Signed: 05/03/2018 5:30:19 PM By: Gretta Cool, BSN, RN, CWS, Kim RN, BSN Signed: 05/03/2018 5:45:37 PM By: Montey Hora Entered By: Montey Hora on 05/03/2018 11:42:49 Licklider, [name] ([id number]) -------------------------------------------------------------------------------- [place] Details Patient Name: [name] Date of Service: 05/03/2018 11:00 AM Medical Record Number: [id number] Patient Account Number: [id number] Date of Birth/Sex: 1923-07-14 (84 y.o.  F) Treating RN: Cornell Barman Primary Care Nachum Derossett: FITZGERALD, DAVID Other Clinician: Referring Jalina Blowers: FITZGERALD, DAVID Treating Quynn Vilchis/Extender: STONE III, HOYT Weeks in Treatment: 38 Vital Signs Time Taken: 11:07 Temperature (F): 98.1 Height (in): 65 Pulse (bpm): 83 Weight (lbs): 154.3 Respiratory Rate (breaths/min): 16 Body Mass Index (BMI): 25.7 Blood Pressure (mmHg): 120/67 Reference Range: 80 - 120 mg / dl Pulse Oximetry (%): 97 Electronic Signature(s) Signed: 05/03/2018 5:30:19 PM By: Gretta Cool, BSN, RN, CWS, Kim RN, BSN Entered By: Gretta Cool, BSN, RN, CWS, Kim on 05/03/2018 11:07:51

## 2018-05-06 NOTE — Progress Notes (Signed)
[name], [name] ([id number]) Visit Report for 05/03/2018 Chief Complaint Document Details Patient Name: [name], [name]. Date of Service: 05/03/2018 11:00 AM Medical Record Number: [id number] Patient Account Number: [id number] Date of Birth/Sex: 1923/10/25 (84 y.o. F) Treating RN: Montey Hora Primary Care Provider: FITZGERALD, DAVID Other Clinician: Referring Provider: FITZGERALD, DAVID Treating Provider/Extender: STONE III,  Weeks in Treatment: 70 Information Obtained from: Patient Chief Complaint Patient is here for right lateral malleolus and left lateral malleolus ulcer Electronic Signature(s) Signed: 05/03/2018 11:45:50 PM By: Worthy Keeler PA-C Entered By: Worthy Keeler on 05/03/2018 11:27:25 [name], [name] ([id number]) -------------------------------------------------------------------------------- Debridement Details Patient Name: [name] Date of Service: 05/03/2018 11:00 AM Medical Record Number: [id number] Patient Account Number: [id number] Date of Birth/Sex: 03-Jul-1923 (84 y.o. F) Treating RN: Montey Hora Primary Care Provider: FITZGERALD, DAVID Other Clinician: Referring Provider: FITZGERALD, DAVID Treating Provider/Extender: STONE III,  Weeks in Treatment: 38 Debridement Performed for Wound #1 Right,Lateral Malleolus Assessment: Performed By: Physician STONE III,  E., PA-C Debridement Type: Debridement Severity of Tissue Pre Other severity specified Debridement: Pre-procedure Verification/Time Yes - 11:47 Out Taken: Start Time: 11:47 Pain Control: Lidocaine 4% Topical Solution Total Area Debrided (L x W): 2 (cm) x 1.8 (cm) = 3.6 (cm) Tissue and other material Viable, Non-Viable, Slough, Subcutaneous, Skin: Dermis , Skin: Epidermis, Slough debrided: Level: Skin/Subcutaneous Tissue Debridement Description: Excisional Instrument: Curette Bleeding: Minimum Hemostasis Achieved: Pressure End Time: 11:50 Procedural Pain:  0 Post Procedural Pain: 0 Response to Treatment: Procedure was tolerated well Level of Consciousness: Awake and Alert Post Procedure Vitals: Temperature: 98.1 Pulse: 83 Respiratory Rate: 16 Blood Pressure: Systolic Blood Pressure: 563 Diastolic Blood Pressure: 67 Post Debridement Measurements of Total Wound Length: (cm) 2 Width: (cm) 1.8 Depth: (cm) 0.6 Volume: (cm) 1.696 Character of Wound/Ulcer Post Debridement: Improved Severity of Tissue Post Debridement: Other severity specified Post Procedure Diagnosis Same as Pre-procedure Notes fascia exposed Electronic Signature(s) [name], [name] ([id number]) Signed: 05/03/2018 5:45:37 PM By: Montey Hora Signed: 05/03/2018 11:45:50 PM By: Worthy Keeler PA-C Entered By: Montey Hora on 05/03/2018 11:51:00 [name], [name] ([id number]) -------------------------------------------------------------------------------- Debridement Details Patient Name: [name] Date of Service: 05/03/2018 11:00 AM Medical Record Number: [id number] Patient Account Number: [id number] Date of Birth/Sex: 17-May-1923 (84 y.o. F) Treating RN: Montey Hora Primary Care Provider: FITZGERALD, DAVID Other Clinician: Referring Provider: FITZGERALD, DAVID Treating Provider/Extender: STONE III,  Weeks in Treatment: 38 Debridement Performed for Wound #2 Left,Lateral Malleolus Assessment: Performed By: Physician STONE III,  E., PA-C Debridement Type: Debridement Pre-procedure Verification/Time Yes - 11:50 Out Taken: Start Time: 11:50 Pain Control: Lidocaine 4% Topical Solution Total Area Debrided (L x W): 1.2 (cm) x 0.9 (cm) = 1.08 (cm) Tissue and other material Viable, Non-Viable, Slough, Subcutaneous, Slough debrided: Level: Skin/Subcutaneous Tissue Debridement Description: Excisional Instrument: Curette Bleeding: Minimum Hemostasis Achieved: Pressure End Time: 11:53 Procedural Pain: 0 Post Procedural Pain: 0 Response  to Treatment: Procedure was tolerated well Level of Consciousness: Awake and Alert Post Procedure Vitals: Temperature: 98.1 Pulse: 83 Respiratory Rate: 16 Blood Pressure: Systolic Blood Pressure: 323 Diastolic Blood Pressure: 67 Post Debridement Measurements of Total Wound Length: (cm) 1.2 Stage: Category/Stage II Width: (cm) 0.9 Depth: (cm) 0.2 Volume: (cm) 0.17 Character of Wound/Ulcer Post Improved Debridement: Post Procedure Diagnosis Same as Pre-procedure Electronic Signature(s) Signed: 05/03/2018 5:45:37 PM By: Montey Hora Signed: 05/03/2018 11:45:50 PM By: Worthy Keeler PA-C Entered By: Montey Hora on 05/03/2018 11:54:01 [name], [name] ([id number]) Greif, [name] ([id number]) -------------------------------------------------------------------------------- HPI Details Patient Name: EGBTDVVO,  [name]. Date of Service: 05/03/2018 11:00 AM Medical Record Number: [id number] Patient Account Number: [id number] Date of Birth/Sex: 13-Nov-1923 (84 y.o. 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 Dr. Amalia Hailey the podiatrist saw her on 07/20/2017 and sent her to the wound center for possible hyperbaric oxygen therapy. past medical history of peripheral vascular disease, varicose veins, status post appendectomy, basal cell carcinoma excision from the left leg, cholecystectomy, pacemaker placement, right lower extremity angiography done by Dr. dew in March 2017 with placement of a stent. there is also note of a successful ablation of the right small saphenous vein done which  was reviewed by ultrasound on 10/24/2016. the patient had a right small saphenous vein ablation done on 10/20/2016. The patient has never been a smoker. She has been seen by Dr. Corene Cornea dew the vascular surgeon who most recently saw her on 06/15/2017 for evaluation of ongoing problems with right leg swelling. She had a lower extremity arterial duplex examination done(02/13/17) which showed patent distal right superficial femoral artery stent and above-the-knee popliteal stent without evidence of restenosis. The ABI was more than 1.3 on the right and more than 1.3 on the left. This was consistent with noncompressible arteries due to medial calcification. The right great toe pressure and PPG waveforms are within normal limits and the left great toe pressure and PPG waveforms are decreased. he recommended she continue to wear her compression stockings and continue with elevation. She is scheduled to have a noninvasive arterial study in the near future 08/16/2017 -- had a lower extremity arterial duplex examination done which showed patent distal right superficial femoral artery stent and above-the-knee popliteal stent without evidence of restenosis. The ABI was more than 1.3 on the right and more than 1.3 on the left. This was consistent with noncompressible arteries due to medial calcification. The right great toe pressure and PPG waveforms are within normal limits and the left great toe pressure and PPG waveforms are decreased. the x-ray of the right ankle has not yet been done 08/24/2017 -- had a right ankle x-ray -- IMPRESSION:1. No fracture, bone lesion or evidence of osteomyelitis. 2. Lateral soft tissue swelling with a soft tissue ulcer. she has not yet seen the vascular surgeon for review 08/31/17 on evaluation today patient's wound appears to be showing signs of improvement. She still with her appointment with vascular in order to review her results of her vascular study and then determine if  any intervention would be recommended at that time. No fevers, chills, nausea, or vomiting noted at this time. She has been tolerating the dressing changes without complication. 09/28/17 on evaluation today patient's wound appears to show signs of good improvement in regard to the granulation tissue which is surfacing. There is still a layer of slough covering the wound and the posterior portion is still significantly deeper than the anterior nonetheless there has been some good sign of things moving towards the better. She is going to go back to Dr. dew for reevaluation to ensure her blood flow is still appropriate. That will be before her next evaluation with [place] next week. No fevers, chills, nausea, or vomiting noted at this time. Patient does have some discomfort rated  to be a 3-4/10 depending on activity specifically cleansing the wound makes it worse. 10/05/2017 -- the patient was seen by Dr. Lucky Cowboy last week and noninvasive studies showed a normal right ABI with brisk triphasic waveforms consistent with no arterial insufficiency including normal digital pressures. The duplex showed a patent distal right SFA stent and the proximal SFA was also normal. He was pleased with her test and thought she should have enough of perfusion for normal wound healing. He would see her back in 6 months time. 12/21/17 on evaluation today patient appears to be doing fairly well in regard to her right lateral ankle wound. Unfortunately the main issue that she is expansion at this point is that she is having some issues with what appears to be some cellulitis in the LA[place]NN, [place]. ([id number]) right anterior shin. She has also been noting a little bit of uncomfortable feeling especially last night and her ankle area. I'm afraid that she made the developing a little bit of an infection. With that being said I think it is in the early stages. 12/28/17 on evaluation today patient's ankle appears to be doing excellent.  She's making good progress at this point the cellulitis seems to have improved after last week's evaluation. Overall she is having no significant discomfort which is excellent news. She does have an appointment with Dr. dew on March 29, 2018 for reevaluation in regard to the stent he placed. She seems to have excellent blood flow in the right lower extremity. 01/19/12 on evaluation today patient's wound appears to be doing very well. In fact she does not appear to require debridement at this point, there's no evidence of infection, and overall from the standpoint of the wound she seems to be doing very well. With that being said I believe that it may be time to switch to different dressing away from the [facility] Dressing she tells me she does have a lot going on her friend actually passed away yesterday and she's also having a lot of issues with her husband this obviously is weighing heavy on her as far as your thoughts and concerns today. 01/25/18 on evaluation today patient appears to be doing fairly well in regard to her right lateral malleolus. She has been tolerating the dressing changes without complication. Overall I feel like this is definitely showing signs of improvement as far as how the overall appearance of the wound is there's also evidence of epithelium start to migrate over the granulation tissue. In general I think that she is progressing nicely as far as the wound is concerned. The only concern she really has is whether or not we can switch to every other week visits in order to avoid having as many appointments as her daughters have a difficult time getting her to her appointments as well as the patient's husband to his he is not doing very well at this point. 02/22/18 on evaluation today patient's right lateral malleolus ulcer appears to be doing great. She has been tolerating the dressing changes without complication. Overall you making excellent progress at this time. Patient  is having no significant discomfort. 03/15/18 on evaluation today patient appears to be doing much more poorly in regard to her right lateral ankle ulcer at this point. Unfortunately since have last seen her her husband has passed just a few days ago is obviously weighed heavily on her her daughter also had surgery well she is with her today as usual. There does not appear to be any  evidence of infection she does seem to have significant contusion/deep tissue injury to the right lateral malleolus which was not noted previous when I saw her last. It's hard to tell of exactly when this injury occurred although during the time she was spending the night in the hospital this may have been most likely. 03/22/18 on evaluation today patient appears to actually be doing very well in regard to her ulcer. She did unfortunately have a setback which was noted last week however the good news is we seem to be getting back on track and in fact the wound in the core did still have some necrotic tissue which will be addressed at this point today but in general I'm seeing signs that things are on the up and up. She is glad to hear this obviously she's been somewhat concerned that due to the how her wound digressed more recently. 03/29/18 on evaluation today patient appears to be doing fairly well in regard to her right lower extremity lateral malleolus ulcer. She unfortunately does have a new area of pressure injury over the inferior portion where the wound has opened up a little bit larger secondary to the pressure she seems to be getting. She does tell me sometimes when she sleeps at night that it actually hurts and does seem to be pushing on the area little bit more unfortunately. There does not appear to be any evidence of infection which is good news. She has been tolerating the dressing changes without complication. She also did have some bruising in the left second and third toes due to the fact that she may  have bump this or injured it although she has neuropathy so she does not feel she did move recently that may have been where this came from. Nonetheless there does not appear to be any evidence of infection at this time. 04/12/18 on evaluation today patient's wound on the right lateral ankle actually appears to be doing a little bit better with a lot of necrotic docking tissue centrally loosening up in clearing away. However she does have the beginnings of a deep tissue injury on the left lateral malleolus likely due to the fact we've been trying offload the right as much as we have. I think she may benefit from an assistive soft device to help with offloading and it looks like they're looking at one of the doughnut conditions that wraps around the lower leg to offload which I think will definitely do a good job. With that being said I think we definitely need to address this issue on the left before it becomes a wound. Patient is not having significant pain. 04/19/18 on evaluation today patient appears to be doing excellent in regard to the progress she's made with her right lateral ankle ulcer. The left ankle region which did show evidence of a deep tissue injury seems to be resolving there's little fluid noted underneath and a blister there's nothing open at this point in time overall I feel like this is progressing nicely which is good news. She does not seem to be having significant discomfort at this point which is also good news. 04/25/18-She is here in follow up evaluation for bilateral lateral malleolar ulcers. The right lateral malleolus ulcer with pale subcutaneous tissue exposure, central area of ulcer with tendon/periosteum exposed. The left lateral malleolus ulcer now with Driscoll, [name] ([id number]) central area of nonviable tissue, otherwise deep tissue injury. She is wearing compression wraps to the left lower extremity, she will  place the right lower extremity compression wraps on  when she gets home. She will be out of town over the weekend and return next week and follow-up appointment. She completed her doxycycline this morning 05/03/18 on evaluation today patient appears to be doing very well in regard to her right lateral ankle ulcer in general. At least she's showing some signs of improvement in this regard. Unfortunately she has some additional injury to the left lateral malleolus region which appears to be new likely even over the past several days. Again this determination is based on the overall appearance. With that being said the patient is obviously frustrated about this currently. Electronic Signature(s) Signed: 05/03/2018 11:45:50 PM By: Worthy Keeler PA-C Entered By: Worthy Keeler on 05/03/2018 23:30:54 [name], [name] ([id number]) -------------------------------------------------------------------------------- Physical Exam Details Patient Name: [name] Date of Service: 05/03/2018 11:00 AM Medical Record Number: [id number] Patient Account Number: [id number] Date of Birth/Sex: Oct 28, 1923 (84 y.o. F) Treating RN: Montey Hora Primary Care Provider: FITZGERALD, DAVID Other Clinician: Referring Provider: FITZGERALD, DAVID Treating Provider/Extender: STONE III,  Weeks in Treatment: 21 Constitutional Well-nourished and well-hydrated in no acute distress. Respiratory normal breathing without difficulty. clear to auscultation bilaterally. Psychiatric this patient is able to make decisions and demonstrates good insight into disease process. Alert and Oriented x 3. pleasant and cooperative. Notes Currently though she is seeming to make some progress at this time I move while still concerned about the fact that she continues to get pressure to the areas of her malleolus region bilaterally intermittently. I'm not sure exactly what's happening other than the fact that she is sleeping on her side which is causing pressure to the sides. We  discussed the type of mattress she is on currently she just on a regular mattress she may benefit from any hospital bed air mattress. Nonetheless this is something that really would not be covered by insurance unfortunately. With that being said I further discussed that there may be a possibility of considering getting a memory foam mattress topper for her bed which could be of benefit for offloading. Electronic Signature(s) Signed: 05/03/2018 11:45:50 PM By: Worthy Keeler PA-C Entered By: Worthy Keeler on 05/03/2018 23:31:59 [name], [name] ([id number]) -------------------------------------------------------------------------------- Physician Orders Details Patient Name: [name] Date of Service: 05/03/2018 11:00 AM Medical Record Number: [id number] Patient Account Number: [id number] Date of Birth/Sex: [birth year] (84 y.o. 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 o Cleanse wound with mild soap and water o May Shower, gently pat wound dry prior to applying new dressing. Anesthetic (add to Medication List) Wound #1 Right,Lateral Malleolus o Topical Lidocaine 4% cream applied to wound bed prior to debridement (In Clinic Only). Primary Wound Dressing Wound #1 Right,Lateral Malleolus o Iodoflex Wound #2 Left,Lateral Malleolus o Silver Collagen Secondary Dressing Wound #1 Right,Lateral  Malleolus o Boardered Foam Dressing Wound #2 Left,Lateral Malleolus o Boardered Foam Dressing Dressing Change Frequency Wound #1 Right,Lateral Malleolus o Change dressing every other day. Wound #2 Left,Lateral Malleolus o Change dressing every other day. Follow-up Appointments  Wound #1 Right,Lateral Malleolus Walen, [name] ([id number]) o Return Appointment in 1 week. Wound #2 Left,Lateral Malleolus o Return Appointment in 1 week. Edema Control Wound #1 Right,Lateral Malleolus o Elevate legs to the level of the heart and pump ankles as often as possible Wound #2 Left,Lateral Malleolus o Elevate legs to the level of the heart and pump ankles as often as possible Off-Loading Wound #1 Right,Lateral Malleolus o Turn and reposition every 2 hours o Other: - do not put pressure on your right ankle Wound #2 Left,Lateral Malleolus o Turn and reposition every 2 hours o Other: - do not put pressure on your right ankle Additional Orders / Instructions Wound #1 Right,Lateral Malleolus o Increase protein intake. o Other: - Add Vitamin C, Zinc, Vitamin A to your diet Wound #2 Left,Lateral Malleolus o Increase protein intake. o Other: - Add Vitamin C, Zinc, Vitamin A to your diet Electronic Signature(s) Signed: 05/03/2018 5:45:37 PM By: Montey Hora Signed: 05/03/2018 11:45:50 PM By: Worthy Keeler PA-C Entered By: Montey Hora on 05/03/2018 11:54:50 [name], [name] ([id number]) -------------------------------------------------------------------------------- Problem List Details Patient Name: [name] Date of Service: 05/03/2018 11:00 AM Medical Record Number: [id number] Patient Account Number: [id number] Date of Birth/Sex: 1923-08-12 (84 y.o. F) Treating RN: Montey Hora Primary Care Provider: FITZGERALD, DAVID Other Clinician: Referring Provider: FITZGERALD, DAVID Treating Provider/Extender: STONE III,  Weeks in Treatment:  34 Active Problems ICD-10 Impacting Encounter Code Description Active Date Wound Healing Diagnosis Y85.027 Non-pressure chronic ulcer of right ankle with unspecified 08/10/2017 No Yes severity I70.233 Atherosclerosis of native arteries of right leg with ulceration of 08/10/2017 No Yes ankle I89.0 Lymphedema, not elsewhere classified 08/10/2017 No Yes B35.4 Tinea corporis 09/28/2017 No Yes L89.520 Pressure ulcer of left ankle, unstageable 04/25/2018 No Yes Inactive Problems Resolved Problems Electronic Signature(s) Signed: 05/03/2018 11:45:50 PM By: Worthy Keeler PA-C Entered By: Worthy Keeler on 05/03/2018 11:27:18 [name], [name] ([id number]) -------------------------------------------------------------------------------- Progress Note Details Patient Name: [name] Date of Service: 05/03/2018 11:00 AM Medical Record Number: [id number] Patient Account Number: [id number] Date of Birth/Sex: 27-Jan-1923 (84 y.o. 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 Dr. Amalia Hailey the podiatrist saw her on 07/20/2017 and sent her to the wound center for possible hyperbaric oxygen therapy. past medical history of peripheral vascular disease, varicose veins, status post appendectomy, basal cell carcinoma excision from the left leg, cholecystectomy, pacemaker placement, right lower extremity angiography done by Dr. dew in March 2017 with placement of a stent. there is also note of a  successful ablation of the right small saphenous vein done which was reviewed by ultrasound on 10/24/2016. the patient had a right small saphenous vein ablation done on 10/20/2016. The patient has never been a smoker. She has been seen by Dr. Corene Cornea dew the vascular surgeon who most recently saw her on 06/15/2017 for evaluation of ongoing problems with right leg swelling. She had a lower extremity arterial duplex examination done(02/13/17) which showed patent distal right superficial femoral artery stent and above-the-knee popliteal stent without evidence of restenosis. The ABI was more than 1.3 on the right  and more than 1.3 on the left. This was consistent with noncompressible arteries due to medial calcification. The right great toe pressure and PPG waveforms are within normal limits and the left great toe pressure and PPG waveforms are decreased. he recommended she continue to wear her compression stockings and continue with elevation. She is scheduled to have a noninvasive arterial study in the near future 08/16/2017 -- had a lower extremity arterial duplex examination done which showed patent distal right superficial femoral artery stent and above-the-knee popliteal stent without evidence of restenosis. The ABI was more than 1.3 on the right and more than 1.3 on the left. This was consistent with noncompressible arteries due to medial calcification. The right great toe pressure and PPG waveforms are within normal limits and the left great toe pressure and PPG waveforms are decreased. the x-ray of the right ankle has not yet been done 08/24/2017 -- had a right ankle x-ray -- IMPRESSION:1. No fracture, bone lesion or evidence of osteomyelitis. 2. Lateral soft tissue swelling with a soft tissue ulcer. she has not yet seen the vascular surgeon for review 08/31/17 on evaluation today patient's wound appears to be showing signs of improvement. She still with her appointment with vascular in order to  review her results of her vascular study and then determine if any intervention would be recommended at that time. No fevers, chills, nausea, or vomiting noted at this time. She has been tolerating the dressing changes without complication. 09/28/17 on evaluation today patient's wound appears to show signs of good improvement in regard to the granulation tissue which is surfacing. There is still a layer of slough covering the wound and the posterior portion is still significantly deeper than the anterior nonetheless there has been some good sign of things moving towards the better. She is going to go back to Dr. dew for reevaluation to ensure her blood flow is still appropriate. That will be before her next evaluation with [place] next week. No fevers, chills, nausea, or vomiting noted at this time. Patient does have some discomfort rated to be a 3-4/10 depending on activity specifically cleansing the wound makes it worse. 10/05/2017 -- the patient was seen by Dr. Lucky Cowboy last week and noninvasive studies showed a normal right ABI with brisk [name], [name]. ([id number]) triphasic waveforms consistent with no arterial insufficiency including normal digital pressures. The duplex showed a patent distal right SFA stent and the proximal SFA was also normal. He was pleased with her test and thought she should have enough of perfusion for normal wound healing. He would see her back in 6 months time. 12/21/17 on evaluation today patient appears to be doing fairly well in regard to her right lateral ankle wound. Unfortunately the main issue that she is expansion at this point is that she is having some issues with what appears to be some cellulitis in the right anterior shin. She has also been noting a little bit of uncomfortable feeling especially last night and her ankle area. I'm afraid that she made the developing a little bit of an infection. With that being said I think it is in the early stages. 12/28/17 on  evaluation today patient's ankle appears to be doing excellent. She's making good progress at this point the cellulitis seems to have improved after last week's evaluation. Overall she is having no significant discomfort which is excellent news. She does have an appointment with Dr. dew on March 29, 2018 for reevaluation in regard to the stent he placed.  She seems to have excellent blood flow in the right lower extremity. 01/19/12 on evaluation today patient's wound appears to be doing very well. In fact she does not appear to require debridement at this point, there's no evidence of infection, and overall from the standpoint of the wound she seems to be doing very well. With that being said I believe that it may be time to switch to different dressing away from the [facility] she tells me she does have a lot going on her friend actually passed away yesterday and she's also having a lot of issues with her husband this obviously is weighing heavy on her as far as your thoughts and concerns today. 01/25/18 on evaluation today patient appears to be doing fairly well in regard to her right lateral malleolus. She has been tolerating the dressing changes without complication. Overall I feel like this is definitely showing signs of improvement as far as how the overall appearance of the wound is there's also evidence of epithelium start to migrate over the granulation tissue. In general I think that she is progressing nicely as far as the wound is concerned. The only concern she really has is whether or not we can switch to every other week visits in order to avoid having as many appointments as her daughters have a difficult time getting her to her appointments as well as the patient's husband to his he is not doing very well at this point. 02/22/18 on evaluation today patient's right lateral malleolus ulcer appears to be doing great. She has been tolerating the dressing changes without  complication. Overall you making excellent progress at this time. Patient is having no significant discomfort. 03/15/18 on evaluation today patient appears to be doing much more poorly in regard to her right lateral ankle ulcer at this point. Unfortunately since have last seen her her husband has passed just a few days ago is obviously weighed heavily on her her daughter also had surgery well she is with her today as usual. There does not appear to be any evidence of infection she does seem to have significant contusion/deep tissue injury to the right lateral malleolus which was not noted previous when I saw her last. It's hard to tell of exactly when this injury occurred although during the time she was spending the night in the hospital this may have been most likely. 03/22/18 on evaluation today patient appears to actually be doing very well in regard to her ulcer. She did unfortunately have a setback which was noted last week however the good news is we seem to be getting back on track and in fact the wound in the core did still have some necrotic tissue which will be addressed at this point today but in general I'm seeing signs that things are on the up and up. She is glad to hear this obviously she's been somewhat concerned that due to the how her wound digressed more recently. 03/29/18 on evaluation today patient appears to be doing fairly well in regard to her right lower extremity lateral malleolus ulcer. She unfortunately does have a new area of pressure injury over the inferior portion where the wound has opened up a little bit larger secondary to the pressure she seems to be getting. She does tell me sometimes when she sleeps at night that it actually hurts and does seem to be pushing on the area little bit more unfortunately. There does not appear to be any evidence of infection which is  good news. She has been tolerating the dressing changes without complication. She also did have some  bruising in the left second and third toes due to the fact that she may have bump this or injured it although she has neuropathy so she does not feel she did move recently that may have been where this came from. Nonetheless there does not appear to be any evidence of infection at this time. 04/12/18 on evaluation today patient's wound on the right lateral ankle actually appears to be doing a little bit better with a lot of necrotic docking tissue centrally loosening up in clearing away. However she does have the beginnings of a deep tissue injury on the left lateral malleolus likely due to the fact we've been trying offload the right as much as we have. I think she may benefit from an assistive soft device to help with offloading and it looks like they're looking at one of the doughnut conditions that wraps around the lower leg to offload which I think will definitely do a good job. With that being said I think we definitely need to address this issue on the left before it becomes a wound. Patient is not having significant pain. [name], [name] ([id number]) 04/19/18 on evaluation today patient appears to be doing excellent in regard to the progress she's made with her right lateral ankle ulcer. The left ankle region which did show evidence of a deep tissue injury seems to be resolving there's little fluid noted underneath and a blister there's nothing open at this point in time overall I feel like this is progressing nicely which is good news. She does not seem to be having significant discomfort at this point which is also good news. 04/25/18-She is here in follow up evaluation for bilateral lateral malleolar ulcers. The right lateral malleolus ulcer with pale subcutaneous tissue exposure, central area of ulcer with tendon/periosteum exposed. The left lateral malleolus ulcer now with central area of nonviable tissue, otherwise deep tissue injury. She is wearing compression wraps to the left lower  extremity, she will place the right lower extremity compression wraps on when she gets home. She will be out of town over the weekend and return next week and follow-up appointment. She completed her doxycycline this morning 05/03/18 on evaluation today patient appears to be doing very well in regard to her right lateral ankle ulcer in general. At least she's showing some signs of improvement in this regard. Unfortunately she has some additional injury to the left lateral malleolus region which appears to be new likely even over the past several days. Again this determination is based on the overall appearance. With that being said the patient is obviously frustrated about this currently. Patient History Information obtained from Patient. Family History Cancer - Father,Siblings, Heart Disease - Siblings, No family history of Diabetes, Hereditary Spherocytosis, Hypertension, Kidney Disease, Lung Disease, Seizures, Stroke, Thyroid Problems, Tuberculosis. Social History Never smoker, Marital Status - Married, Alcohol Use - Never, Drug Use - No History, Caffeine Use - Rarely. Review of Systems (ROS) Constitutional Symptoms (General Health) Denies complaints or symptoms of Fever, Chills. Respiratory The patient has no complaints or symptoms. Cardiovascular The patient has no complaints or symptoms. Psychiatric The patient has no complaints or symptoms. Objective Constitutional Well-nourished and well-hydrated in no acute distress. Vitals Time Taken: 11:07 AM, Height: 65 in, Weight: 154.3 lbs, BMI: 25.7, Temperature: 98.1 F, Pulse: 83 bpm, Respiratory Rate: 16 breaths/min, Blood Pressure: 120/67 mmHg, Pulse Oximetry: 97 %. Respiratory normal  breathing without difficulty. clear to auscultation bilaterally. Psychiatric this patient is able to make decisions and demonstrates good insight into disease process. Alert and Oriented x 3. pleasant Dreisbach, [name] ([id number]) and  cooperative. General Notes: Currently though she is seeming to make some progress at this time I move while still concerned about the fact that she continues to get pressure to the areas of her malleolus region bilaterally intermittently. I'm not sure exactly what's happening other than the fact that she is sleeping on her side which is causing pressure to the sides. We discussed the type of mattress she is on currently she just on a regular mattress she may benefit from any hospital bed air mattress. Nonetheless this is something that really would not be covered by insurance unfortunately. With that being said I further discussed that there may be a possibility of considering getting a memory foam mattress topper for her bed which could be of benefit for offloading. Integumentary (Hair, Skin) Wound #1 status is Open. Original cause of wound was Gradually Appeared. The wound is located on the Right,Lateral Malleolus. The wound measures 2cm length x 1.8cm width x 0.5cm depth; 2.827cm^2 area and 1.414cm^3 volume. There is Fat Layer (Subcutaneous Tissue) Exposed and fascia exposed. There is no tunneling or undermining noted. There is a medium amount of serosanguineous drainage noted. The wound margin is distinct with the outline attached to the wound base. There is no granulation within the wound bed. There is a large (67-100%) amount of necrotic tissue within the wound bed including Adherent Slough. The periwound skin appearance exhibited: Rubor, Erythema. The periwound skin appearance did not exhibit: Callus, Crepitus, Excoriation, Induration, Rash, Scarring, Dry/Scaly, Maceration, Atrophie Blanche, Cyanosis, Ecchymosis, Hemosiderin Staining, Mottled, Pallor. The surrounding wound skin color is noted with erythema which is circumferential. Periwound temperature was noted as No Abnormality. The periwound has tenderness on palpation. Wound #2 status is Open. Original cause of wound was Gradually  Appeared. The wound is located on the Left,Lateral Malleolus. The wound measures 1.2cm length x 0.9cm width x 0.1cm depth; 0.848cm^2 area and 0.085cm^3 volume. The wound is limited to skin breakdown. There is no tunneling or undermining noted. There is a medium amount of serous drainage noted. The wound margin is flat and intact. There is no granulation within the wound bed. There is a large (67-100%) amount of necrotic tissue within the wound bed including Eschar. The periwound skin appearance did not exhibit: Callus, Crepitus, Excoriation, Induration, Rash, Scarring, Dry/Scaly, Maceration, Atrophie Blanche, Cyanosis, Ecchymosis, Hemosiderin Staining, Mottled, Pallor, Rubor, Erythema. Periwound temperature was noted as No Abnormality. 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 There was a Excisional Skin/Subcutaneous Tissue Debridement with a total area of 3.6 sq cm performed by STONE III,  E., PA-C. With the following instrument(s): Curette to remove Viable and Non- Viable tissue/material. Material removed includes Subcutaneous Tissue, Slough, Skin: Dermis, and Skin: Epidermis after achieving pain control using Lidocaine 4% Topical Solution. No specimens were taken. A time out was conducted at 11:47, prior to the start of the procedure. A Minimum amount of bleeding was controlled with Pressure. The procedure was tolerated  [name], [name] ([id number]) well with a pain level of 0 throughout and a pain level of 0 following the procedure. Patient  s Level of Consciousness post procedure was recorded as Awake and Alert. Post Debridement Measurements: 2cm length x 1.8cm width x 0.6cm depth; 1.696cm^3 volume. Character of Wound/Ulcer Post Debridement is improved. Severity of Tissue Post Debridement is: Other severity specified. Post procedure Diagnosis Wound #1: Same as Pre-Procedure General Notes: fascia exposed. Wound #2 Pre-procedure diagnosis of Wound #2 is a Pressure Ulcer located on the Left,Lateral Malleolus . There was a Excisional Skin/Subcutaneous Tissue Debridement with a total area of 1.08 sq cm performed by STONE III,  E., PA-C. With the following instrument(s): Curette to remove Viable and Non-Viable tissue/material. Material removed includes Subcutaneous Tissue and Slough and after achieving pain control using Lidocaine 4% Topical Solution. No specimens were taken. A time out was conducted at 11:50, prior to the start of the procedure. A Minimum amount of bleeding was controlled with Pressure. The procedure was tolerated well with a pain level of 0 throughout and a pain level of 0 following the procedure. Patient s Level of Consciousness post procedure was recorded as Awake and Alert. Post Debridement Measurements: 1.2cm length x 0.9cm width x 0.2cm depth; 0.17cm^3 volume. Post debridement Stage noted as Category/Stage II. Character of Wound/Ulcer Post Debridement is improved. Post procedure Diagnosis Wound #2: Same as Pre-Procedure Plan Wound Cleansing: Wound #1 Right,Lateral Malleolus: Clean wound with Normal Saline. Cleanse wound with mild soap and water May Shower, gently pat wound dry prior to applying new dressing. Anesthetic (add to Medication List): Wound #1 Right,Lateral Malleolus: Topical Lidocaine 4% cream applied to wound bed prior to debridement (In Clinic Only). Primary Wound Dressing: Wound #1 Right,Lateral Malleolus: Iodoflex Wound #2 Left,Lateral Malleolus: Silver Collagen Secondary  Dressing: Wound #1 Right,Lateral Malleolus: Boardered Foam Dressing Wound #2 Left,Lateral Malleolus: Boardered Foam Dressing Dressing Change Frequency: Wound #1 Right,Lateral Malleolus: Change dressing every other day. Wound #2 Left,Lateral Malleolus: Change dressing every other day. Follow-up Appointments: Wound #1 Right,Lateral Malleolus: Return Appointment in 1 week. Wound #2 Left,Lateral Malleolus: Return Appointment in 1 week. Edema Control: Wound #1 Right,Lateral Malleolus: Elevate legs to the level of the heart and pump ankles as often as possible Wound #2 Left,Lateral Malleolus: Elevate legs to the level of the heart and pump ankles as often as possible Peraza, [name] ([id number]) Off-Loading: Wound #1 Right,Lateral Malleolus: Turn and reposition every 2 hours Other: - do not put pressure on your right ankle Wound #2 Left,Lateral Malleolus: Turn and reposition every 2 hours Other: - do not put pressure on your right ankle Additional Orders / Instructions: Wound #1 Right,Lateral Malleolus: Increase protein intake. Other: - Add Vitamin C, Zinc, Vitamin A to your diet Wound #2 Left,Lateral Malleolus: Increase protein intake. Other: - Add Vitamin C, Zinc, Vitamin A to your diet I'm gonna suggest currently that we continue with the Current wound care measures for the next week. I will initiate Prisma for the left lateral ankle ulcer. She's in agreement this plan. We will subsequently see were things stand in one weeks time. Please see above for specific wound care orders. We will see patient for re-evaluation in 1 week(s) here in the clinic. If anything worsens or changes patient will contact our office for additional recommendations. Electronic Signature(s) Signed: 05/03/2018 11:45:50 PM By: Worthy Keeler PA-C Entered By: Worthy Keeler on 05/03/2018 23:32:27 [name]  ([id number]) -------------------------------------------------------------------------------- ROS/PFSH Details Patient Name: [name] Date of  Service: 05/03/2018 11:00 AM Medical Record Number: [id number] Patient Account Number: [id number] Date of Birth/Sex: [birth year] (84 y.o. F) Treating RN: Montey Hora Primary Care Provider: FITZGERALD, DAVID Other Clinician: Referring Provider: FITZGERALD, DAVID Treating Provider/Extender: STONE III,  Weeks in Treatment: 55 Information Obtained From Patient Wound History Do you currently have one or more open woundso Yes How many open wounds do you currently haveo 1 Approximately how long have you had your woundso 2 yrs How have you been treating your wound(s) until nowo mupirocin, soaking in epsom salt Has your wound(s) ever healed and then re-openedo No Have you had any lab work done in the past montho No Have you tested positive for an antibiotic resistant organism (MRSA, VRE)o No Have you tested positive for osteomyelitis (bone infection)o No Have you had any tests for circulation on your legso Yes Who ordered the testo Dr. Lucky Cowboy [facility] Constitutional Symptoms (General Health) Complaints and Symptoms: Negative for: Fever; Chills Eyes Medical History: Positive for: Cataracts - surgery Respiratory Complaints and Symptoms: No Complaints or Symptoms Cardiovascular Complaints and Symptoms: No Complaints or Symptoms Medical History: Positive for: Congestive Heart Failure; Hypertension Musculoskeletal Medical History: Positive for: Osteoarthritis Neurologic Medical History: Positive for: Neuropathy Oncologic FREDDI, FORSTER ([id number]) Medical History: Negative for: Received Chemotherapy; Received Radiation Psychiatric Complaints and Symptoms: No Complaints or Symptoms HBO Extended History Items Eyes: Cataracts Immunizations Pneumococcal Vaccine: Received Pneumococcal Vaccination:  Yes Implantable Devices Family and Social History Cancer: Yes - Father,Siblings; Diabetes: No; Heart Disease: Yes - Siblings; Hereditary Spherocytosis: No; Hypertension: No; Kidney Disease: No; Lung Disease: No; Seizures: No; Stroke: No; Thyroid Problems: No; Tuberculosis: No; Never smoker; Marital Status - Married; Alcohol Use: Never; Drug Use: No History; Caffeine Use: Rarely; Financial Concerns: No; Food, Clothing or Shelter Needs: No; Support System Lacking: No; Transportation Concerns: No; Advanced Directives: No; Patient does not want information on Advanced Directives; Do not resuscitate: No; Living Will: Yes (Not Provided); Medical Power of Attorney: No Physician Affirmation I have reviewed and agree with the above information. Electronic Signature(s) Signed: 05/03/2018 11:45:50 PM By: Worthy Keeler PA-C Signed: 05/06/2018 3:38:14 PM By: Montey Hora Entered By: Worthy Keeler on 05/03/2018 23:31:23 Josten, [name] ([id number]) -------------------------------------------------------------------------------- SuperBill Details Patient Name: [name] Date of Service: 05/03/2018 Medical Record Number: [id number] Patient Account Number: [id number] Date of Birth/Sex: Apr 22, 1923 (84 y.o. 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 I89.0 Lymphedema, not elsewhere classified B35.4 Tinea corporis L89.520 Pressure ulcer of left ankle, unstageable Facility Procedures CPT4 Code: [id number] Description: 16967 - DEB SUBQ TISSUE 20 SQ CM/< ICD-10 Diagnosis Description E93.810 Non-pressure chronic ulcer of right ankle with  unspecified L89.520 Pressure ulcer of left ankle, unstageable Modifier: severity Quantity: 1 Physician Procedures CPT4 Code: [id number] Description: 85277 - WC PHYS SUBQ TISS 20 SQ CM ICD-10 Diagnosis Description O24.235 Non-pressure chronic ulcer of right ankle with unspecified s L89.520 Pressure ulcer of left ankle, unstageable Modifier: everity Quantity: 1 Electronic Signature(s) Signed: 05/03/2018 11:45:50 PM By: Worthy Keeler PA-C Entered By: Worthy Keeler on 05/03/2018 23:32:45

## 2018-05-10 ENCOUNTER — Encounter: Payer: Medicare Other | Attending: Nurse Practitioner | Admitting: Nurse Practitioner

## 2018-05-10 ENCOUNTER — Ambulatory Visit
Admission: RE | Admit: 2018-05-10 | Discharge: 2018-05-10 | Disposition: A | Discharge status: Home / Self Care | Payer: Medicare Other | Source: Ambulatory Visit | Attending: Nurse Practitioner | Admitting: Nurse Practitioner

## 2018-05-10 ENCOUNTER — Other Ambulatory Visit: Payer: Self-pay | Admitting: Nurse Practitioner

## 2018-05-10 DIAGNOSIS — T148XXA Other injury of unspecified body region, initial encounter: Secondary | ICD-10-CM

## 2018-05-10 DIAGNOSIS — I739 Peripheral vascular disease, unspecified: Secondary | ICD-10-CM | POA: Diagnosis not present

## 2018-05-10 DIAGNOSIS — L89523 Pressure ulcer of left ankle, stage 3: Secondary | ICD-10-CM | POA: Insufficient documentation

## 2018-05-10 DIAGNOSIS — L89514 Pressure ulcer of right ankle, stage 4: Secondary | ICD-10-CM | POA: Diagnosis not present

## 2018-05-10 DIAGNOSIS — Z85828 Personal history of other malignant neoplasm of skin: Secondary | ICD-10-CM | POA: Diagnosis not present

## 2018-05-10 DIAGNOSIS — I110 Hypertensive heart disease with heart failure: Secondary | ICD-10-CM | POA: Insufficient documentation

## 2018-05-10 DIAGNOSIS — I509 Heart failure, unspecified: Secondary | ICD-10-CM | POA: Insufficient documentation

## 2018-05-10 DIAGNOSIS — S91001A Unspecified open wound, right ankle, initial encounter: Secondary | ICD-10-CM | POA: Diagnosis not present

## 2018-05-10 DIAGNOSIS — M7989 Other specified soft tissue disorders: Secondary | ICD-10-CM | POA: Insufficient documentation

## 2018-05-10 DIAGNOSIS — I890 Lymphedema, not elsewhere classified: Secondary | ICD-10-CM | POA: Diagnosis not present

## 2018-05-10 DIAGNOSIS — L97319 Non-pressure chronic ulcer of right ankle with unspecified severity: Secondary | ICD-10-CM | POA: Insufficient documentation

## 2018-05-13 NOTE — Progress Notes (Signed)
[name], [name] ([id number]) Visit Report for 05/10/2018 Chief Complaint Document Details Patient Name: [name], [name]. Date of Service: 05/10/2018 12:30 PM Medical Record Number: [id number] Patient Account Number: [id number] Date of Birth/Sex: [birth year] (84 y.o. F) Treating RN: Montey Hora Primary Care Provider: FITZGERALD, DAVID Other Clinician: Referring Provider: FITZGERALD, DAVID Treating Provider/Extender: Cathie Olden in Treatment: 39 Information Obtained from: Patient Chief Complaint Patient is here for right lateral malleolus and left lateral malleolus ulcer Electronic Signature(s) Signed: 05/10/2018 4:14:55 PM By: Lawanda Cousins Entered By: Lawanda Cousins on 05/10/2018 16:14:55 Caissie, [name] ([id number]) -------------------------------------------------------------------------------- Debridement Details Patient Name: [name] Date of Service: 05/10/2018 12:30 PM Medical Record Number: [id number] Patient Account Number: [id number] Date of Birth/Sex: 07/03/1923 (84 y.o. F) Treating RN: Montey Hora Primary Care Provider: FITZGERALD, DAVID Other Clinician: Referring Provider: FITZGERALD, DAVID Treating Provider/Extender: Cathie Olden in Treatment: 39 Debridement Performed for Wound #1 Right,Lateral Malleolus Assessment: Performed By: Physician Lawanda Cousins, NP Debridement Type: Debridement Severity of Tissue Pre Fat layer exposed Debridement: Pre-procedure Verification/Time Yes - 12:54 Out Taken: Start Time: 12:54 Pain Control: Lidocaine 4% Topical Solution Total Area Debrided (L x W): 2.3 (cm) x 2 (cm) = 4.6 (cm) Tissue and other material Non-Viable, Slough, Skin: Dermis , Slough debrided: Level: Skin/Dermis Debridement Description: Selective/Open Wound Instrument: Curette Bleeding: Minimum Hemostasis Achieved: Pressure End Time: 12:56 Procedural Pain: 0 Post Procedural Pain: 0 Response to Treatment: Procedure was  tolerated well Level of Consciousness: Awake and Alert Post Procedure Vitals: Temperature: 98.4 Pulse: 88 Respiratory Rate: 16 Blood Pressure: Systolic Blood Pressure: 809 Diastolic Blood Pressure: 63 Post Debridement Measurements of Total Wound Length: (cm) 2.3 Width: (cm) 2 Depth: (cm) 0.6 Volume: (cm) 2.168 Character of Wound/Ulcer Post Debridement: Improved Severity of Tissue Post Debridement: Fat layer exposed Post Procedure Diagnosis Same as Pre-procedure Electronic Signature(s) Signed: 05/10/2018 4:14:28 PM By: Lawanda Cousins Signed: 05/10/2018 4:22:23 PM By: Lacretia Nicks ([id number]) Entered By: Lawanda Cousins on 05/10/2018 16:14:28 [name] ([id number]) -------------------------------------------------------------------------------- Debridement Details Patient Name: [name] Date of Service: 05/10/2018 12:30 PM Medical Record Number: [id number] Patient Account Number: [id number] Date of Birth/Sex: [birth year] (84 y.o. F) Treating RN: Montey Hora Primary Care Provider: FITZGERALD, DAVID Other Clinician: Referring Provider: FITZGERALD, DAVID Treating Provider/Extender: Cathie Olden in Treatment: 39 Debridement Performed for Wound #2 Left,Lateral Malleolus Assessment: Performed By: Physician Lawanda Cousins, NP Debridement Type: Debridement Pre-procedure Verification/Time Yes - 12:52 Out Taken: Start Time: 12:52 Pain Control: Lidocaine 4% Topical Solution Total Area Debrided (L x W): 1.7 (cm) x 1.7 (cm) = 2.89 (cm) Tissue and other material Viable, Non-Viable, Slough, Skin: Dermis , Slough debrided: Level: Skin/Dermis Debridement Description: Selective/Open Wound Instrument: Curette Bleeding: None End Time: 12:54 Procedural Pain: 0 Post Procedural Pain: 0 Response to Treatment: Procedure was tolerated well Level of Consciousness: Awake and Alert Post Procedure Vitals: Temperature: 98.4 Pulse: 88 Respiratory  Rate: 16 Blood Pressure: Systolic Blood Pressure: 353 Diastolic Blood Pressure: 63 Post Debridement Measurements of Total Wound Length: (cm) 1.7 Stage: Category/Stage II Width: (cm) 1.7 Depth: (cm) 0.3 Volume: (cm) 0.681 Character of Wound/Ulcer Post Improved Debridement: Post Procedure Diagnosis Same as Pre-procedure Electronic Signature(s) Signed: 05/10/2018 4:14:46 PM By: Lawanda Cousins Signed: 05/10/2018 4:22:23 PM By: Montey Hora Entered By: Lawanda Cousins on 05/10/2018 16:14:46 Jaycox, [name] ([id number]) -------------------------------------------------------------------------------- HPI Details Patient Name: [name] Date of Service: 05/10/2018 12:30 PM Medical Record Number: [id number] Patient Account Number: [id number] Date of Birth/Sex: Apr 14, 1923 (84 y.o. 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 Dr. Amalia Hailey the podiatrist saw her on 07/20/2017 and sent her to the wound center for possible hyperbaric oxygen therapy. past medical history of peripheral vascular disease, varicose veins, status post appendectomy, basal cell carcinoma excision from the left leg, cholecystectomy, pacemaker placement, right lower extremity angiography done by Dr. dew in March 2017 with placement of a stent. there is also note of a successful ablation of the right small saphenous vein done which was reviewed by ultrasound on 10/24/2016. the patient had a right small saphenous vein ablation done on 10/20/2016. The patient has never been a smoker. She has been seen by Dr. Corene Cornea dew the vascular  surgeon who most recently saw her on 06/15/2017 for evaluation of ongoing problems with right leg swelling. She had a lower extremity arterial duplex examination done(02/13/17) which showed patent distal right superficial femoral artery stent and above-the-knee popliteal stent without evidence of restenosis. The ABI was more than 1.3 on the right and more than 1.3 on the left. This was consistent with noncompressible arteries due to medial calcification. The right great toe pressure and PPG waveforms are within normal limits and the left great toe pressure and PPG waveforms are decreased. he recommended she continue to wear her compression stockings and continue with elevation. She is scheduled to have a noninvasive arterial study in the near future 08/16/2017 -- had a lower extremity arterial duplex examination done which showed patent distal right superficial femoral artery stent and above-the-knee popliteal stent without evidence of restenosis. The ABI was more than 1.3 on the right and more than 1.3 on the left. This was consistent with noncompressible arteries due to medial calcification. The right great toe pressure and PPG waveforms are within normal limits and the left great toe pressure and PPG waveforms are decreased. the x-ray of the right ankle has not yet been done 08/24/2017 -- had a right ankle x-ray -- IMPRESSION:1. No fracture, bone lesion or evidence of osteomyelitis. 2. Lateral soft tissue swelling with a soft tissue ulcer. she has not yet seen the vascular surgeon for review 08/31/17 on evaluation today patient's wound appears to be showing signs of improvement. She still with her appointment with vascular in order to review her results of her vascular study and then determine if any intervention would be recommended at that time. No fevers, chills, nausea, or vomiting noted at this time. She has been tolerating the dressing changes without complication. 09/28/17 on evaluation  today patient's wound appears to show signs of good improvement in regard to the granulation tissue which is surfacing. There is still a layer of slough covering the wound and the posterior portion is still significantly deeper than the anterior nonetheless there has been some good sign of things moving towards the better. She is going to go back to Dr. dew for reevaluation to ensure her blood flow is still appropriate. That will be before her next evaluation with [place] next week. No fevers, chills, nausea, or vomiting noted at this time. Patient does have some discomfort rated to be a 3-4/10 depending on activity specifically cleansing the wound makes it worse. 10/05/2017 -- the patient was seen by Dr. Lucky Cowboy last week and noninvasive  studies showed a normal right ABI with brisk triphasic waveforms consistent with no arterial insufficiency including normal digital pressures. The duplex showed a patent distal right SFA stent and the proximal SFA was also normal. He was pleased with her test and thought she should have enough of perfusion for normal wound healing. He would see her back in 6 months time. 12/21/17 on evaluation today patient appears to be doing fairly well in regard to her right lateral ankle wound. Unfortunately the main issue that she is expansion at this point is that she is having some issues with what appears to be some cellulitis in the CHIANTE, PEDEN. ([id number]) right anterior shin. She has also been noting a little bit of uncomfortable feeling especially last night and her ankle area. I'm afraid that she made the developing a little bit of an infection. With that being said I think it is in the early stages. 12/28/17 on evaluation today patient's ankle appears to be doing excellent. She's making good progress at this point the cellulitis seems to have improved after last week's evaluation. Overall she is having no significant discomfort which is excellent news. She does have an  appointment with Dr. dew on March 29, 2018 for reevaluation in regard to the stent he placed. She seems to have excellent blood flow in the right lower extremity. 01/19/12 on evaluation today patient's wound appears to be doing very well. In fact she does not appear to require debridement at this point, there's no evidence of infection, and overall from the standpoint of the wound she seems to be doing very well. With that being said I believe that it may be time to switch to different dressing away from the [facility] Dressing she tells me she does have a lot going on her friend actually passed away yesterday and she's also having a lot of issues with her husband this obviously is weighing heavy on her as far as your thoughts and concerns today. 01/25/18 on evaluation today patient appears to be doing fairly well in regard to her right lateral malleolus. She has been tolerating the dressing changes without complication. Overall I feel like this is definitely showing signs of improvement as far as how the overall appearance of the wound is there's also evidence of epithelium start to migrate over the granulation tissue. In general I think that she is progressing nicely as far as the wound is concerned. The only concern she really has is whether or not we can switch to every other week visits in order to avoid having as many appointments as her daughters have a difficult time getting her to her appointments as well as the patient's husband to his he is not doing very well at this point. 02/22/18 on evaluation today patient's right lateral malleolus ulcer appears to be doing great. She has been tolerating the dressing changes without complication. Overall you making excellent progress at this time. Patient is having no significant discomfort. 03/15/18 on evaluation today patient appears to be doing much more poorly in regard to her right lateral ankle ulcer at this point. Unfortunately since have last  seen her her husband has passed just a few days ago is obviously weighed heavily on her her daughter also had surgery well she is with her today as usual. There does not appear to be any evidence of infection she does seem to have significant contusion/deep tissue injury to the right lateral malleolus which was not noted previous when I saw her last.  It's hard to tell of exactly when this injury occurred although during the time she was spending the night in the hospital this may have been most likely. 03/22/18 on evaluation today patient appears to actually be doing very well in regard to her ulcer. She did unfortunately have a setback which was noted last week however the good news is we seem to be getting back on track and in fact the wound in the core did still have some necrotic tissue which will be addressed at this point today but in general I'm seeing signs that things are on the up and up. She is glad to hear this obviously she's been somewhat concerned that due to the how her wound digressed more recently. 03/29/18 on evaluation today patient appears to be doing fairly well in regard to her right lower extremity lateral malleolus ulcer. She unfortunately does have a new area of pressure injury over the inferior portion where the wound has opened up a little bit larger secondary to the pressure she seems to be getting. She does tell me sometimes when she sleeps at night that it actually hurts and does seem to be pushing on the area little bit more unfortunately. There does not appear to be any evidence of infection which is good news. She has been tolerating the dressing changes without complication. She also did have some bruising in the left second and third toes due to the fact that she may have bump this or injured it although she has neuropathy so she does not feel she did move recently that may have been where this came from. Nonetheless there does not appear to be any evidence of  infection at this time. 04/12/18 on evaluation today patient's wound on the right lateral ankle actually appears to be doing a little bit better with a lot of necrotic docking tissue centrally loosening up in clearing away. However she does have the beginnings of a deep tissue injury on the left lateral malleolus likely due to the fact we've been trying offload the right as much as we have. I think she may benefit from an assistive soft device to help with offloading and it looks like they're looking at one of the doughnut conditions that wraps around the lower leg to offload which I think will definitely do a good job. With that being said I think we definitely need to address this issue on the left before it becomes a wound. Patient is not having significant pain. 04/19/18 on evaluation today patient appears to be doing excellent in regard to the progress she's made with her right lateral ankle ulcer. The left ankle region which did show evidence of a deep tissue injury seems to be resolving there's little fluid noted underneath and a blister there's nothing open at this point in time overall I feel like this is progressing nicely which is good news. She does not seem to be having significant discomfort at this point which is also good news. 04/25/18-She is here in follow up evaluation for bilateral lateral malleolar ulcers. The right lateral malleolus ulcer with pale subcutaneous tissue exposure, central area of ulcer with tendon/periosteum exposed. The left lateral malleolus ulcer now with Wadleigh, [name] ([id number]) central area of nonviable tissue, otherwise deep tissue injury. She is wearing compression wraps to the left lower extremity, she will place the right lower extremity compression wraps on when she gets home. She will be out of town over the weekend and return next week and follow-up  appointment. She completed her doxycycline this morning 05/03/18 on evaluation today patient appears  to be doing very well in regard to her right lateral ankle ulcer in general. At least she's showing some signs of improvement in this regard. Unfortunately she has some additional injury to the left lateral malleolus region which appears to be new likely even over the past several days. Again this determination is based on the overall appearance. With that being said the patient is obviously frustrated about this currently. 05/10/18-She is here in follow-up evaluation for bilateral lateral malleolar ulcers. She states she has purchased offloading shoes/boots and they will arrive tomorrow. She was asked to bring them in the office at next week's appointment so her provider is aware of product being utilized. She continues to sleep on right or left side, she has been encouraged to sleep on her back. The right lateral malleolus ulcer is precariously close to peri-osteum; will order xray. The left lateral malleolus ulcer is improved. Will switch back to santyl; she will follow up next week. Electronic Signature(s) Signed: 05/10/2018 4:17:39 PM By: Lawanda Cousins Entered By: Lawanda Cousins on 05/10/2018 16:17:39 Lessley, [name] ([id number]) -------------------------------------------------------------------------------- Physician Orders Details Patient Name: [name] Date of Service: 05/10/2018 12:30 PM Medical Record Number: [id number] Patient Account Number: [id number] Date of Birth/Sex: December 29, 1922 (84 y.o. F) Treating RN: Montey Hora Primary Care Provider: FITZGERALD, DAVID Other Clinician: Referring Provider: FITZGERALD, DAVID Treating Provider/Extender: Cathie Olden in Treatment: 62 Verbal / Phone Orders: No Diagnosis Coding Wound Cleansing Wound #1 Right,Lateral Malleolus o Clean wound with Normal Saline. o Cleanse wound with mild soap and water o May Shower, gently pat wound dry prior to applying new dressing. Anesthetic (add to Medication List) Wound #1  Right,Lateral Malleolus o Topical Lidocaine 4% cream applied to wound bed prior to debridement (In Clinic Only). Primary Wound Dressing Wound #1 Right,Lateral Malleolus o Santyl Ointment Wound #2 Left,Lateral Malleolus o Santyl Ointment Secondary Dressing Wound #1 Right,Lateral Malleolus o Saline moistened gauze o Boardered Foam Dressing Wound #2 Left,Lateral Malleolus o Saline moistened gauze o Boardered Foam Dressing Dressing Change Frequency Wound #1 Right,Lateral Malleolus o Change dressing every day. Wound #2 Left,Lateral Malleolus o Change dressing every day. Follow-up Appointments Wound #1 Right,Lateral Malleolus o Return Appointment in 1 week. Wound #2 Left,Lateral Malleolus o Return Appointment in 1 week. Edema Control Wound #1 Right,Lateral Malleolus Tiller, [name] ([id number]) o Elevate legs to the level of the heart and pump ankles as often as possible Wound #2 Left,Lateral Malleolus o Elevate legs to the level of the heart and pump ankles as often as possible Off-Loading Wound #1 Right,Lateral Malleolus o Turn and reposition every 2 hours o Other: - do not put pressure on your right ankle Wound #2 Left,Lateral Malleolus o Turn and reposition every 2 hours o Other: - do not put pressure on your right ankle Additional Orders / Instructions Wound #1 Right,Lateral Malleolus o Increase protein intake. o Other: - Add Vitamin C, Zinc, Vitamin A to your diet Wound #2 Left,Lateral Malleolus o Increase protein intake. o Other: - Add Vitamin C, Zinc, Vitamin A to your diet Radiology o X-ray, ankle - right Patient Medications Allergies: sulfa, metronidazole, monistat Notifications Medication Indication Start End Santyl 05/11/2018 DOSE topical 250 unit/gram ointment - ointment topical; apply topically to ulcers daily Electronic Signature(s) Signed: 05/10/2018 4:19:36 PM By: Lawanda Cousins Entered By: Lawanda Cousins on  05/10/2018 16:19:36 Sesma, [name] ([id number]) -------------------------------------------------------------------------------- Problem List Details Patient Name: [name] Date  of Service: 05/10/2018 12:30 PM Medical Record Number: [id number] Patient Account Number: [id number] Date of Birth/Sex: 09-08-1923 (84 y.o. 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, stage 4 05/10/2018 No Yes I70.233 Atherosclerosis of native arteries of right leg with ulceration of 08/10/2017 No Yes ankle I89.0 Lymphedema, not elsewhere classified 08/10/2017 No Yes B35.4 Tinea corporis 09/28/2017 No Yes L89.523 Pressure ulcer of left ankle, stage 3 04/25/2018 No Yes Inactive Problems Resolved Problems Electronic Signature(s) Signed: 05/10/2018 4:21:03 PM By: Lawanda Cousins Previous Signature: 05/10/2018 4:13:02 PM Version By: Lawanda Cousins Entered By: Lawanda Cousins on 05/10/2018 16:21:03 [name] ([id number]) -------------------------------------------------------------------------------- Progress Note Details Patient Name: [name] Date of Service: 05/10/2018 12:30 PM Medical Record Number: [id number] Patient Account Number: [id number] Date of Birth/Sex: 1923/10/06 (84 y.o. 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 Dr. Amalia Hailey the podiatrist saw her on 07/20/2017 and sent her to the wound center for possible hyperbaric oxygen therapy. past medical history of peripheral vascular disease, varicose veins, status post appendectomy, basal cell carcinoma excision from the left leg, cholecystectomy, pacemaker placement, right lower extremity angiography done by Dr. dew in March 2017 with placement of a stent. there is also note of a successful ablation of the right small saphenous vein done which was reviewed by ultrasound on 10/24/2016. the patient had a right small saphenous vein ablation done on 10/20/2016. The patient has never been a smoker. She has been seen by Dr. Corene Cornea dew the vascular surgeon who most recently saw her on 06/15/2017 for evaluation of ongoing problems with right leg swelling. She had a lower extremity arterial duplex examination done(02/13/17) which showed patent distal right superficial femoral artery stent and above-the-knee popliteal stent without evidence of restenosis. The ABI was more than 1.3 on the right and more than 1.3 on the left. This was consistent with noncompressible arteries due to medial calcification. The right great toe pressure and PPG waveforms are within normal limits and the left great toe pressure and PPG waveforms are decreased. he recommended she continue to wear her compression stockings and continue with elevation. She is scheduled to have a noninvasive arterial study in the near future 08/16/2017 -- had a lower extremity arterial duplex examination done which showed patent distal right superficial femoral artery stent and above-the-knee popliteal stent without evidence of restenosis. The ABI was more than 1.3 on the right and more than 1.3 on the left. This was consistent with noncompressible arteries due to medial calcification. The right  great toe pressure and PPG waveforms are within normal limits and the left great toe pressure and PPG waveforms are decreased. the x-ray of the right ankle has not yet been done 08/24/2017 -- had a right ankle x-ray -- IMPRESSION:1. No fracture, bone lesion or evidence of osteomyelitis. 2. Lateral soft tissue swelling with a soft tissue ulcer. she has not yet seen the vascular surgeon for review 08/31/17  on evaluation today patient's wound appears to be showing signs of improvement. She still with her appointment with vascular in order to review her results of her vascular study and then determine if any intervention would be recommended at that time. No fevers, chills, nausea, or vomiting noted at this time. She has been tolerating the dressing changes without complication. 09/28/17 on evaluation today patient's wound appears to show signs of good improvement in regard to the granulation tissue which is surfacing. There is still a layer of slough covering the wound and the posterior portion is still significantly deeper than the anterior nonetheless there has been some good sign of things moving towards the better. She is going to go back to Dr. dew for reevaluation to ensure her blood flow is still appropriate. That will be before her next evaluation with [place] next week. No fevers, chills, nausea, or vomiting noted at this time. Patient does have some discomfort rated to be a 3-4/10 depending on activity specifically cleansing the wound makes it worse. 10/05/2017 -- the patient was seen by Dr. Lucky Cowboy last week and noninvasive studies showed a normal right ABI with brisk MAURY, BAMBA. ([id number]) triphasic waveforms consistent with no arterial insufficiency including normal digital pressures. The duplex showed a patent distal right SFA stent and the proximal SFA was also normal. He was pleased with her test and thought she should have enough of perfusion for normal wound healing. He would see  her back in 6 months time. 12/21/17 on evaluation today patient appears to be doing fairly well in regard to her right lateral ankle wound. Unfortunately the main issue that she is expansion at this point is that she is having some issues with what appears to be some cellulitis in the right anterior shin. She has also been noting a little bit of uncomfortable feeling especially last night and her ankle area. I'm afraid that she made the developing a little bit of an infection. With that being said I think it is in the early stages. 12/28/17 on evaluation today patient's ankle appears to be doing excellent. She's making good progress at this point the cellulitis seems to have improved after last week's evaluation. Overall she is having no significant discomfort which is excellent news. She does have an appointment with Dr. dew on March 29, 2018 for reevaluation in regard to the stent he placed. She seems to have excellent blood flow in the right lower extremity. 01/19/12 on evaluation today patient's wound appears to be doing very well. In fact she does not appear to require debridement at this point, there's no evidence of infection, and overall from the standpoint of the wound she seems to be doing very well. With that being said I believe that it may be time to switch to different dressing away from the [facility] Dressing she tells me she does have a lot going on her friend actually passed away yesterday and she's also having a lot of issues with her husband this obviously is weighing heavy on her as far as your thoughts and concerns today. 01/25/18 on evaluation today patient appears to be doing fairly well in regard to her right lateral malleolus. She has been tolerating the dressing changes without complication. Overall I feel like this is definitely showing signs of improvement as far as how the overall appearance of the wound is there's also evidence of epithelium start to migrate over the  granulation tissue. In general I think that she is progressing nicely as  far as the wound is concerned. The only concern she really has is whether or not we can switch to every other week visits in order to avoid having as many appointments as her daughters have a difficult time getting her to her appointments as well as the patient's husband to his he is not doing very well at this point. 02/22/18 on evaluation today patient's right lateral malleolus ulcer appears to be doing great. She has been tolerating the dressing changes without complication. Overall you making excellent progress at this time. Patient is having no significant discomfort. 03/15/18 on evaluation today patient appears to be doing much more poorly in regard to her right lateral ankle ulcer at this point. Unfortunately since have last seen her her husband has passed just a few days ago is obviously weighed heavily on her her daughter also had surgery well she is with her today as usual. There does not appear to be any evidence of infection she does seem to have significant contusion/deep tissue injury to the right lateral malleolus which was not noted previous when I saw her last. It's hard to tell of exactly when this injury occurred although during the time she was spending the night in the hospital this may have been most likely. 03/22/18 on evaluation today patient appears to actually be doing very well in regard to her ulcer. She did unfortunately have a setback which was noted last week however the good news is we seem to be getting back on track and in fact the wound in the core did still have some necrotic tissue which will be addressed at this point today but in general I'm seeing signs that things are on the up and up. She is glad to hear this obviously she's been somewhat concerned that due to the how her wound digressed more recently. 03/29/18 on evaluation today patient appears to be doing fairly well in regard to her  right lower extremity lateral malleolus ulcer. She unfortunately does have a new area of pressure injury over the inferior portion where the wound has opened up a little bit larger secondary to the pressure she seems to be getting. She does tell me sometimes when she sleeps at night that it actually hurts and does seem to be pushing on the area little bit more unfortunately. There does not appear to be any evidence of infection which is good news. She has been tolerating the dressing changes without complication. She also did have some bruising in the left second and third toes due to the fact that she may have bump this or injured it although she has neuropathy so she does not feel she did move recently that may have been where this came from. Nonetheless there does not appear to be any evidence of infection at this time. 04/12/18 on evaluation today patient's wound on the right lateral ankle actually appears to be doing a little bit better with a lot of necrotic docking tissue centrally loosening up in clearing away. However she does have the beginnings of a deep tissue injury on the left lateral malleolus likely due to the fact we've been trying offload the right as much as we have. I think she may benefit from an assistive soft device to help with offloading and it looks like they're looking at one of the doughnut conditions that wraps around the lower leg to offload which I think will definitely do a good job. With that being said I think we definitely need to address  this issue on the left before it becomes a wound. Patient is not having significant pain. ALANYA, VUKELICH ([id number]) 04/19/18 on evaluation today patient appears to be doing excellent in regard to the progress she's made with her right lateral ankle ulcer. The left ankle region which did show evidence of a deep tissue injury seems to be resolving there's little fluid noted underneath and a blister there's nothing open at this  point in time overall I feel like this is progressing nicely which is good news. She does not seem to be having significant discomfort at this point which is also good news. 04/25/18-She is here in follow up evaluation for bilateral lateral malleolar ulcers. The right lateral malleolus ulcer with pale subcutaneous tissue exposure, central area of ulcer with tendon/periosteum exposed. The left lateral malleolus ulcer now with central area of nonviable tissue, otherwise deep tissue injury. She is wearing compression wraps to the left lower extremity, she will place the right lower extremity compression wraps on when she gets home. She will be out of town over the weekend and return next week and follow-up appointment. She completed her doxycycline this morning 05/03/18 on evaluation today patient appears to be doing very well in regard to her right lateral ankle ulcer in general. At least she's showing some signs of improvement in this regard. Unfortunately she has some additional injury to the left lateral malleolus region which appears to be new likely even over the past several days. Again this determination is based on the overall appearance. With that being said the patient is obviously frustrated about this currently. 05/10/18-She is here in follow-up evaluation for bilateral lateral malleolar ulcers. She states she has purchased offloading shoes/boots and they will arrive tomorrow. She was asked to bring them in the office at next week's appointment so her provider is aware of product being utilized. She continues to sleep on right or left side, she has been encouraged to sleep on her back. The right lateral malleolus ulcer is precariously close to peri-osteum; will order xray. The left lateral malleolus ulcer is improved. Will switch back to santyl; she will follow up next week. Patient History Information obtained from Patient. Family History Cancer - Father,Siblings, Heart Disease -  Siblings, No family history of Diabetes, Hereditary Spherocytosis, Hypertension, Kidney Disease, Lung Disease, Seizures, Stroke, Thyroid Problems, Tuberculosis. Social History Never smoker, Marital Status - Married, Alcohol Use - Never, Drug Use - No History, Caffeine Use - Rarely. Objective Constitutional Vitals Time Taken: 12:35 PM, Height: 65 in, Weight: 154.3 lbs, BMI: 25.7, Temperature: 98.4 F, Pulse: 88 bpm, Respiratory Rate: 16 breaths/min, Blood Pressure: 117/63 mmHg. Integumentary (Hair, Skin) Wound #1 status is Open. Original cause of wound was Gradually Appeared. The wound is located on the Right,Lateral Malleolus. The wound measures 2.3cm length x 2cm width x 0.5cm depth; 3.613cm^2 area and 1.806cm^3 volume. There is Fat Layer (Subcutaneous Tissue) Exposed and fascia exposed. There is no tunneling or undermining noted. There is a large amount of serous drainage noted. The wound margin is distinct with the outline attached to the wound base. There is no granulation within the wound bed. There is a large (67-100%) amount of necrotic tissue within the wound bed including Eschar and Adherent Slough. The periwound skin appearance exhibited: Maceration, Rubor, Erythema. The periwound skin appearance did not exhibit: Callus, Crepitus, Excoriation, Induration, Rash, Scarring, Dry/Scaly, Atrophie Blanche, Cyanosis, Ecchymosis, Hemosiderin Staining, Mottled, Pallor. The surrounding wound skin color is noted with erythema which is circumferential. Periwound temperature was  noted as No Abnormality. The periwound has tenderness on palpation. VALERI, SULA ([id number]) Wound #2 status is Open. Original cause of wound was Gradually Appeared. The wound is located on the Left,Lateral Malleolus. The wound measures 1.7cm length x 1.7cm width x 0.2cm depth; 2.27cm^2 area and 0.454cm^3 volume. The wound is limited to skin breakdown. There is no tunneling or undermining noted. There is a large  amount of serous drainage noted. The wound margin is flat and intact. There is no granulation within the wound bed. There is a large (67-100%) amount of necrotic tissue within the wound bed including Eschar and Adherent Slough. The periwound skin appearance exhibited: Maceration. The periwound skin appearance did not exhibit: Callus, Crepitus, Excoriation, Induration, Rash, Scarring, Dry/Scaly, Atrophie Blanche, Cyanosis, Ecchymosis, Hemosiderin Staining, Mottled, Pallor, Rubor, Erythema. Periwound temperature was noted as No Abnormality. 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 There was a Selective/Open Wound Skin/Dermis Debridement with a total area of 4.6 sq cm performed by Lawanda Cousins, NP. With the following instrument(s): Curette to remove Non-Viable tissue/material. Material removed includes Slough and Skin: Dermis and after achieving pain control using Lidocaine 4% Topical Solution. No specimens were taken. A time out was conducted at 12:54, prior to the start of the procedure. A Minimum amount of bleeding was controlled with Pressure. The procedure was tolerated well with a pain level of 0 throughout and a pain level of 0 following the procedure. Patient s Level of Consciousness post procedure was recorded as Awake and Alert. Post Debridement Measurements: 2.3cm length x 2cm width x 0.6cm depth; 2.168cm^3 volume. Character of Wound/Ulcer Post Debridement is improved. Severity of Tissue Post Debridement is: Fat layer exposed. Post procedure Diagnosis Wound #1: Same as Pre-Procedure Wound #2 Pre-procedure diagnosis of  Wound #2 is a Pressure Ulcer located on the Left,Lateral Malleolus . There was a Selective/Open Wound Skin/Dermis Debridement with a total area of 2.89 sq cm performed by Lawanda Cousins, NP. With the following instrument(s): Curette to remove Viable and Non-Viable tissue/material. Material removed includes Slough and Skin: Dermis and after achieving pain control using Lidocaine 4% Topical Solution. No specimens were taken. A time out was conducted at 12:52, prior to the start of the procedure. There was no bleeding. The procedure was tolerated well with a pain level of 0 throughout and a pain level of 0 following the procedure. Patient s Level of Consciousness post procedure was recorded as Awake and Alert. Post Debridement Measurements: 1.7cm length x 1.7cm width x 0.3cm depth; 0.681cm^3 volume. Post debridement Stage noted as Category/Stage II. Character of Wound/Ulcer Post Debridement is improved. Post procedure Diagnosis Wound #2: Same as Pre-Procedure [place], [name] ([id number]) Plan Wound Cleansing: Wound #1 Right,Lateral Malleolus: Clean wound with Normal Saline. Cleanse wound with mild soap and water May Shower, gently pat wound dry prior to applying new dressing. Anesthetic (add to Medication List): Wound #1 Right,Lateral Malleolus: Topical Lidocaine 4% cream applied to wound bed prior to debridement (In Clinic Only). Primary Wound Dressing: Wound #1 Right,Lateral Malleolus: Santyl Ointment Wound #2 Left,Lateral Malleolus: Santyl Ointment Secondary Dressing: Wound #1 Right,Lateral Malleolus: Saline moistened gauze Boardered Foam Dressing Wound #2 Left,Lateral Malleolus: Saline moistened gauze Boardered Foam Dressing Dressing  Change Frequency: Wound #1 Right,Lateral Malleolus: Change dressing every day. Wound #2 Left,Lateral Malleolus: Change dressing every day. Follow-up Appointments: Wound #1 Right,Lateral Malleolus: Return Appointment in 1 week. Wound #2  Left,Lateral Malleolus: Return Appointment in 1 week. Edema Control: Wound #1 Right,Lateral Malleolus: Elevate legs to the level of the heart and pump ankles as often as possible Wound #2 Left,Lateral Malleolus: Elevate legs to the level of the heart and pump ankles as often as possible Off-Loading: Wound #1 Right,Lateral Malleolus: Turn and reposition every 2 hours Other: - do not put pressure on your right ankle Wound #2 Left,Lateral Malleolus: Turn and reposition every 2 hours Other: - do not put pressure on your right ankle Additional Orders / Instructions: Wound #1 Right,Lateral Malleolus: Increase protein intake. Other: - Add Vitamin C, Zinc, Vitamin A to your diet Wound #2 Left,Lateral Malleolus: Increase protein intake. Other: - Add Vitamin C, Zinc, Vitamin A to your diet Radiology ordered were: X-ray, ankle - right The following medication(s) was prescribed: Santyl topical 250 unit/gram ointment ointment topical; apply topically to ulcers daily starting 05/11/2018 Tasharra, Nodine [name] ([id number]) Electronic Signature(s) Signed: 05/10/2018 4:21:21 PM By: Lawanda Cousins Previous Signature: 05/10/2018 4:19:59 PM Version By: Lawanda Cousins Entered By: Lawanda Cousins on 05/10/2018 16:21:21 [name] ([id number]) -------------------------------------------------------------------------------- ROS/PFSH Details Patient Name: [name] Date of Service: 05/10/2018 12:30 PM Medical Record Number: [id number] Patient Account Number: [id number] Date of Birth/Sex: 1923/04/20 (84 y.o. F) Treating RN: Montey Hora Primary Care Provider: FITZGERALD, DAVID Other Clinician: Referring Provider: FITZGERALD, DAVID Treating Provider/Extender: Cathie Olden in Treatment: 4 Information Obtained From Patient Wound History Do you currently have one or more open woundso Yes How many open wounds do you currently haveo 1 Approximately how long have you had your woundso 2  yrs How have you been treating your wound(s) until nowo mupirocin, soaking in epsom salt Has your wound(s) ever healed and then re-openedo No Have you had any lab work done in the past montho No Have you tested positive for an antibiotic resistant organism (MRSA, VRE)o No Have you tested positive for osteomyelitis (bone infection)o No Have you had any tests for circulation on your legso Yes Who ordered the testo Dr. Lucky Cowboy Where [facility] the test doneo [facility] Eyes Medical History: Positive for: Cataracts - surgery Cardiovascular Medical History: Positive for: Congestive Heart Failure; Hypertension Musculoskeletal Medical History: Positive for: Osteoarthritis Neurologic Medical History: Positive for: Neuropathy Oncologic Medical History: Negative for: Received Chemotherapy; Received Radiation HBO Extended History Items Eyes: Cataracts Immunizations Pneumococcal Vaccine: Received Pneumococcal Vaccination: Yes KAYTIE, RATCLIFFE ([id number]) Implantable Devices Family and Social History Cancer: Yes - Father,Siblings; Diabetes: No; Heart Disease: Yes - Siblings; Hereditary Spherocytosis: No; Hypertension: No; Kidney Disease: No; Lung Disease: No; Seizures: No; Stroke: No; Thyroid Problems: No; Tuberculosis: No; Never smoker; Marital Status - Married; Alcohol Use: Never; Drug Use: No History; Caffeine Use: Rarely; Financial Concerns: No; Food, Clothing or Shelter Needs: No; Support System Lacking: No; Transportation Concerns: No; Advanced Directives: No; Patient does not want information on Advanced Directives; Do not resuscitate: No; Living Will: Yes (Not Provided); Medical Power of Attorney: No Physician Affirmation I have reviewed and agree with the above information. Electronic Signature(s) Signed: 05/10/2018 4:22:23 PM By: Montey Hora Signed: 05/10/2018 4:45:12 PM By: Lawanda Cousins Entered By: Lawanda Cousins on 05/10/2018 16:19:46 [place], [name]  ([id number]) -------------------------------------------------------------------------------- SuperBill Details Patient Name: [name] Date of Service: 05/10/2018 Medical Record Number: [id number] Patient Account Number: [id number] Date of Birth/Sex: 10-31-1923 (84 y.o.  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 I89.0 Lymphedema, not elsewhere classified B35.4 Tinea corporis L89.523 Pressure ulcer of left ankle, stage 3 Facility Procedures CPT4 Code: [id number] Description: 29924 - DEBRIDE WOUND 1ST 20 SQ CM OR < ICD-10 Diagnosis Description 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, stage 4 L89.523 Pressure ulcer of left ankle, stage 3 Modifier: Quantity: 1 Physician Procedures CPT4 Code: [id number] Description: 62229 - WC PHYS DEBR WO ANESTH 20 SQ CM ICD-10 Diagnosis Description 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, stage 4 L89.523 Pressure ulcer of left ankle, stage 3 Modifier: Quantity: 1 Electronic Signature(s) Signed: 05/10/2018 4:21:33 PM By: Lawanda Cousins Entered By: Lawanda Cousins on 05/10/2018 16:21:33

## 2018-05-13 NOTE — Progress Notes (Signed)
FREEDOM, LOPEZPEREZ ([id number]) Visit Report for 05/10/2018 Arrival Information Details Patient Name: [name], [name] Date of Service: 05/10/2018 12:30 PM Medical Record Number: [id number] Patient Account Number: [id number] Date of Birth/Sex: [birth year] (84 y.o. F) Treating RN: Ahmed Prima Primary Care Matthew Cina: FITZGERALD, DAVID Other Clinician: Referring Garrell Flagg: FITZGERALD, DAVID Treating Polina Burmaster/Extender: Cathie Olden in Treatment: 35 Visit Information History Since Last Visit All ordered tests and consults were completed: No Patient Arrived: Ambulatory Added or deleted any medications: No Arrival Time: 12:35 Any new allergies or adverse reactions: No Accompanied By: daughter Had a fall or experienced change in No Transfer Assistance: None activities of daily living that may affect Patient Identification Verified: Yes risk of falls: Secondary Verification Process Completed: Yes Signs or symptoms of abuse/neglect since last visito No Patient Requires Transmission-Based No Hospitalized since last visit: No Precautions: Implantable device outside of the clinic excluding No Patient Has Alerts: No cellular tissue based products placed in the center since last visit: Has Dressing in Place as Prescribed: Yes Has Compression in Place as Prescribed: Yes Pain Present Now: No Electronic Signature(s) Signed: 05/10/2018 4:48:34 PM By: Alric Quan Entered By: Alric Quan on 05/10/2018 12:35:31 Doughtie, [name] ([id number]) -------------------------------------------------------------------------------- Encounter Discharge Information Details Patient Name: [name] Date of Service: 05/10/2018 12:30 PM Medical Record Number: [id number] Patient Account Number: [id number] Date of Birth/Sex: [birth year] (84 y.o. F) Treating RN: Ahmed Prima Primary Care Jennice Renegar: FITZGERALD, DAVID Other Clinician: Referring Vincente Asbridge: FITZGERALD, DAVID Treating  Coco Sharpnack/Extender: Cathie Olden in Treatment: 56 Encounter Discharge Information Items Discharge Condition: Stable Ambulatory Status: Ambulatory Discharge Destination: Home Transportation: Private Auto Accompanied By: daughter Schedule Follow-up Appointment: Yes Clinical Summary of Care: Electronic Signature(s) Signed: 05/10/2018 4:48:34 PM By: Alric Quan Entered By: Alric Quan on 05/10/2018 13:09:20 Keltz, [name] ([id number]) -------------------------------------------------------------------------------- Lower Extremity Assessment Details Patient Name: [name] Date of Service: 05/10/2018 12:30 PM Medical Record Number: [id number] Patient Account Number: [id number] Date of Birth/Sex: [birth year] (84 y.o. F) Treating RN: Ahmed Prima Primary Care Molly Savarino: FITZGERALD, DAVID Other Clinician: Referring Ellory Khurana: [place], DAVID Treating Jaden Abreu/Extender: Cathie Olden in Treatment: 39 Vascular Assessment Pulses: Dorsalis Pedis Palpable: [Left:Yes] [Right:Yes] Posterior Tibial Extremity colors, hair growth, and conditions: Extremity Color: [Left:Normal] [Right:Normal] Temperature of Extremity: [Left:Warm] [Right:Warm] Capillary Refill: [Left:< 3 seconds] [Right:< 3 seconds] Toe Nail Assessment Left: Right: Thick: Yes Yes Discolored: Yes Yes Deformed: Yes Yes Improper Length and Hygiene: No No Electronic Signature(s) Signed: 05/10/2018 4:48:34 PM By: Alric Quan Entered By: Alric Quan on 05/10/2018 12:47:41 [place], [name] ([id number]) -------------------------------------------------------------------------------- Multi Wound Chart Details Patient Name: [name] Date of Service: 05/10/2018 12:30 PM Medical Record Number: [id number] Patient Account Number: [id number] Date of Birth/Sex: 1923-04-18 (84 y.o. F) Treating RN: Montey Hora Primary Care Duvall Comes: FITZGERALD, DAVID Other Clinician: Referring  Alston Berrie: FITZGERALD, DAVID Treating Morell Mears/Extender: Lawanda Cousins Weeks in Treatment: 39 Vital Signs Height(in): 65 Pulse(bpm): 88 Weight(lbs): 154.3 Blood Pressure(mmHg): 117/63 Body Mass Index(BMI): 26 Temperature(F): 98.4 Respiratory Rate 16 (breaths/min): Photos: [1:No Photos] [2:No Photos] [N/A:N/A] Wound Location: [1:Right Malleolus - Lateral] [2:Left Malleolus - Lateral] [N/A:N/A] Wounding Event: [1:Gradually Appeared] [2:Gradually Appeared] [N/A:N/A] Primary Etiology: [1:Lymphedema] [2:Pressure Ulcer] [N/A:N/A] Secondary Etiology: [1:Arterial Insufficiency Ulcer] [2:N/A] [N/A:N/A] Comorbid History: [1:Cataracts, Congestive Heart Failure, Hypertension, Osteoarthritis, Neuropathy] [2:Cataracts, Congestive Heart Failure, Hypertension, Osteoarthritis, Neuropathy] [N/A:N/A] Date Acquired: [1:08/11/2015] [2:04/12/2018] [N/A:N/A] Weeks of Treatment: [1:39] [2:3] [N/A:N/A] Wound Status: [1:Open] [2:Open] [N/A:N/A] Measurements L x W x D [1:2.3x2x0.5] [2:1.7x1.7x0.2] [N/A:N/A] (cm) Area (cm) : [1:3.613] [2:2.27] [N/A:N/A] Volume (cm) : [  1:1.806] [2:0.454] [N/A:N/A] % Reduction in Area: [1:-135.80%] [2:-189.20%] [N/A:N/A] % Reduction in Volume: [1:-293.50%] [2:-474.70%] [N/A:N/A] Classification: [1:Full Thickness With Exposed Support Structures] [2:Category/Stage II] [N/A:N/A] Exudate Amount: [1:Large] [2:Large] [N/A:N/A] Exudate Type: [1:Serous] [2:Serous] [N/A:N/A] Exudate Color: [1:amber] [2:amber] [N/A:N/A] Wound Margin: [1:Distinct, outline attached] [2:Flat and Intact] [N/A:N/A] Granulation Amount: [1:None Present (0%)] [2:None Present (0%)] [N/A:N/A] Necrotic Amount: [1:Large (67-100%)] [2:Large (67-100%)] [N/A:N/A] Necrotic Tissue: [1:Eschar, Adherent Slough] [2:Eschar, Adherent Slough] [N/A:N/A] Exposed Structures: [1:Fascia: Yes Fat Layer (Subcutaneous Tissue) Exposed: Yes] [2:Fascia: No Fat Layer (Subcutaneous Tissue) Exposed: No Tendon: No Muscle: No Joint: No  Bone: No Limited to Skin Breakdown] [N/A:N/A] Epithelialization: [1:None] [2:None] [N/A:N/A] Debridement: [1:Debridement - Excisional 12:54] [2:Debridement - Excisional 12:52] [N/A:N/A N/A] Pre-procedure Verification/Time Out Taken: Pain Control: Lidocaine 4% Topical Solution Lidocaine 4% Topical Solution N/A Tissue Debrided: Subcutaneous, Slough Subcutaneous, Slough N/A Level: Skin/Subcutaneous Tissue Skin/Subcutaneous Tissue N/A Debridement Area (sq cm): 4.6 2.89 N/A Instrument: Curette Curette N/A Bleeding: Minimum None N/A Hemostasis Achieved: Pressure N/A N/A Procedural Pain: 0 0 N/A Post Procedural Pain: 0 0 N/A Debridement Treatment Procedure was tolerated well Procedure was tolerated well N/A Response: Post Debridement 2.3x2x0.6 1.7x1.7x0.3 N/A Measurements L x W x D (cm) Post Debridement Volume: 2.168 0.681 N/A (cm) Post Debridement Stage: N/A Category/Stage II N/A Periwound Skin Texture: Excoriation: No Excoriation: No N/A Induration: No Induration: No Callus: No Callus: No Crepitus: No Crepitus: No Rash: No Rash: No Scarring: No Scarring: No Periwound Skin Moisture: Maceration: Yes Maceration: Yes N/A Dry/Scaly: No Dry/Scaly: No Periwound Skin Color: Erythema: Yes Atrophie Blanche: No N/A Rubor: Yes Cyanosis: No Atrophie Blanche: No Ecchymosis: No Cyanosis: No Erythema: No Ecchymosis: No Hemosiderin Staining: No Hemosiderin Staining: No Mottled: No Mottled: No Pallor: No Pallor: No Rubor: No Erythema Location: Circumferential N/A N/A Temperature: No Abnormality No Abnormality N/A Tenderness on Palpation: Yes No N/A Wound Preparation: Ulcer Cleansing: Ulcer Cleansing: N/A Rinsed/Irrigated with Saline Rinsed/Irrigated with Saline Topical Anesthetic Applied: Topical Anesthetic Applied: Other: lidocaine 4% Other: lidocaine 4% Procedures Performed: Debridement Debridement N/A Treatment Notes Wound #1 (Right, Lateral Malleolus) 1. Cleansed  with: Clean wound with Normal Saline 2. Anesthetic Topical Lidocaine 4% cream to wound bed prior to debridement 4. Dressing Applied: Santyl Ointment 5. Secondary Dressing Applied Bordered Foam Dressing Dry Gauze JETTIE, MANNOR ([id number]) Wound #2 (Left, Lateral Malleolus) 1. Cleansed with: Clean wound with Normal Saline 2. Anesthetic Topical Lidocaine 4% cream to wound bed prior to debridement 4. Dressing Applied: Santyl Ointment 5. Secondary Dressing Applied Bordered Foam Dressing Dry Gauze Electronic Signature(s) Signed: 05/10/2018 4:13:43 PM By: Lawanda Cousins Entered By: Lawanda Cousins on 05/10/2018 16:13:42 Hutzler, [name] ([id number]) -------------------------------------------------------------------------------- [place] Details Patient Name: [name] Date of Service: 05/10/2018 12:30 PM Medical Record Number: [id number] Patient Account Number: [id number] Date of Birth/Sex: [birth year] (84 y.o. F) Treating RN: Montey Hora Primary Care Mehtab Dolberry: FITZGERALD, DAVID Other Clinician: Referring Alton Bouknight: FITZGERALD, DAVID Treating Travelle Mcclimans/Extender: Cathie Olden in Treatment: 71 Active Inactive ` Abuse / Safety / Falls / Self Care Management Nursing Diagnoses: Potential for falls Goals: Patient will not experience any injury related to falls Date Initiated: 08/10/2017 Target Resolution Date: 11/10/2017 Goal Status: Active Interventions: Assess Activities of Daily Living upon admission and as needed Assess fall risk on admission and as needed Assess: immobility, friction, shearing, incontinence upon admission and as needed Notes: ` Nutrition Nursing Diagnoses: Imbalanced nutrition Potential for alteratiion in Nutrition/Potential for imbalanced nutrition Goals: Patient/caregiver agrees to and verbalizes understanding of need to use nutritional supplements  and/or vitamins as prescribed Date Initiated: 08/10/2017 Target  Resolution Date: 12/08/2017 Goal Status: Active Interventions: Assess patient nutrition upon admission and as needed per policy Notes: ` Orientation to the Wound Care Program Nursing Diagnoses: Knowledge deficit related to the wound healing center program Goals: Patient/caregiver will verbalize understanding of the [place] Date Initiated: 08/10/2017 Target Resolution Date: 09/08/2017 SAMINA, WEEKES ([id number]) Goal Status: Active Interventions: Provide education on orientation to the wound center Notes: ` Pain, Acute or Chronic Nursing Diagnoses: Pain, acute or chronic: actual or potential Potential alteration in comfort, pain Goals: Patient/caregiver will verbalize adequate pain control between visits Date Initiated: 08/10/2017 Target Resolution Date: 12/08/2017 Goal Status: Active Interventions: Complete pain assessment as per visit requirements Notes: ` Wound/Skin Impairment Nursing Diagnoses: Impaired tissue integrity Knowledge deficit related to ulceration/compromised skin integrity Goals: Ulcer/skin breakdown will have a volume reduction of 80% by week 12 Date Initiated: 08/10/2017 Target Resolution Date: 12/01/2017 Goal Status: Active Interventions: Assess patient/caregiver ability to perform ulcer/skin care regimen upon admission and as needed Notes: Electronic Signature(s) Signed: 05/10/2018 4:22:23 PM By: Montey Hora Entered By: Montey Hora on 05/10/2018 12:52:56 Bosler, [name] ([id number]) -------------------------------------------------------------------------------- Pain Assessment Details Patient Name: [name] Date of Service: 05/10/2018 12:30 PM Medical Record Number: [id number] Patient Account Number: [id number] Date of Birth/Sex: [birth year] (84 y.o. F) Treating RN: Ahmed Prima Primary Care Marton Malizia: FITZGERALD, DAVID Other Clinician: Referring Trayvond Viets: FITZGERALD, DAVID Treating Ulysses Alper/Extender: Cathie Olden in Treatment: 39 Active Problems Location of Pain Severity and Description of Pain Patient Has Paino No Site Locations Pain Management and Medication Current Pain Management: Electronic Signature(s) Signed: 05/10/2018 4:48:34 PM By: Alric Quan Entered By: Alric Quan on 05/10/2018 12:35:41 Hobin, [name] ([id number]) -------------------------------------------------------------------------------- Patient/Caregiver Education Details Patient Name: [name] Date of Service: 05/10/2018 12:30 PM Medical Record Number: [id number] Patient Account Number: [id number] Date of Birth/Gender: 09/27/1923 (84 y.o. F) Treating RN: Ahmed Prima Primary Care Physician: FITZGERALD, DAVID Other Clinician: Referring Physician: FITZGERALD, DAVID Treating Physician/Extender: Cathie Olden in Treatment: 40 Education Assessment Education Provided To: Patient Education Topics Provided Wound/Skin Impairment: Handouts: Caring for Your Ulcer, Skin Care Do's and Dont's, Other: change dressing as ordered Methods: Demonstration, Explain/Verbal Responses: State content correctly Electronic Signature(s) Signed: 05/10/2018 4:48:34 PM By: Alric Quan Entered By: Alric Quan on 05/10/2018 13:09:37 Whichard, [name] ([id number]) -------------------------------------------------------------------------------- Wound Assessment Details Patient Name: [name] Date of Service: 05/10/2018 12:30 PM Medical Record Number: [id number] Patient Account Number: [id number] Date of Birth/Sex: 05-Jan-1923 (84 y.o. F) Treating RN: Ahmed Prima Primary Care Symeon Puleo: FITZGERALD, DAVID Other Clinician: Referring Reid Nawrot: FITZGERALD, DAVID Treating Pinkey Mcjunkin/Extender: Lawanda Cousins Weeks in Treatment: 39 Wound Status Wound Number: 1 Primary Lymphedema Etiology: Wound Location: Right Malleolus - Lateral Secondary Arterial Insufficiency Ulcer Wounding Event:  Gradually Appeared Etiology: Date Acquired: 08/11/2015 Wound Status: Open Weeks Of Treatment: 39 Comorbid Cataracts, Congestive Heart Failure, Clustered Wound: No History: Hypertension, Osteoarthritis, Neuropathy Wound Measurements Length: (cm) 2.3 Width: (cm) 2 Depth: (cm) 0.5 Area: (cm) 3.613 Volume: (cm) 1.806 % Reduction in Area: -135.8% % Reduction in Volume: -293.5% Epithelialization: None Tunneling: No Undermining: No Wound Description Full Thickness With Exposed Support Foul O Classification: Structures Slough Wound Margin: Distinct, outline attached Exudate Large Amount: Exudate Type: Serous Exudate Color: amber dor After Cleansing: No /Fibrino Yes Wound Bed Granulation Amount: None Present (0%) Exposed Structure Necrotic Amount: Large (67-100%) Fascia Exposed: Yes Necrotic Quality: Eschar, Adherent Slough Fat Layer (Subcutaneous Tissue) Exposed: Yes Periwound Skin Texture Texture Color No  Abnormalities Noted: No No Abnormalities Noted: No Callus: No Atrophie Blanche: No Crepitus: No Cyanosis: No Excoriation: No Ecchymosis: No Induration: No Erythema: Yes Rash: No Erythema Location: Circumferential Scarring: No Hemosiderin Staining: No Mottled: No Moisture Pallor: No No Abnormalities Noted: No Rubor: Yes Dry / Scaly: No Maceration: Yes Temperature / Pain Temperature: No Abnormality Tenderness on Palpation: Yes [place], [name] ([id number]) Wound Preparation Ulcer Cleansing: Rinsed/Irrigated with Saline Topical Anesthetic Applied: Other: lidocaine 4%, Treatment Notes Wound #1 (Right, Lateral Malleolus) 1. Cleansed with: Clean wound with Normal Saline 2. Anesthetic Topical Lidocaine 4% cream to wound bed prior to debridement 4. Dressing Applied: Santyl Ointment 5. Secondary Dressing Applied Bordered Foam Dressing Dry Gauze Electronic Signature(s) Signed: 05/10/2018 4:48:34 PM By: Alric Quan Entered By: Alric Quan on  05/10/2018 12:43:16 Leicht, [name] ([id number]) -------------------------------------------------------------------------------- Wound Assessment Details Patient Name: [name] Date of Service: 05/10/2018 12:30 PM Medical Record Number: [id number] Patient Account Number: [id number] Date of Birth/Sex: Oct 18, 1923 (84 y.o. F) Treating RN: Ahmed Prima Primary Care Lilliahna Schubring: FITZGERALD, DAVID Other Clinician: Referring Loralye Loberg: FITZGERALD, DAVID Treating Daneen Volcy/Extender: Lawanda Cousins Weeks in Treatment: 39 Wound Status Wound Number: 2 Primary Pressure Ulcer Etiology: Wound Location: Left Malleolus - Lateral Wound Open Wounding Event: Gradually Appeared Status: Date Acquired: 04/12/2018 Comorbid Cataracts, Congestive Heart Failure, Weeks Of Treatment: 3 History: Hypertension, Osteoarthritis, Neuropathy Clustered Wound: No Photos Photo Uploaded By: Alric Quan on 05/10/2018 16:46:56 Wound Measurements Length: (cm) 1.7 Width: (cm) 1.7 Depth: (cm) 0.2 Area: (cm) 2.27 Volume: (cm) 0.454 % Reduction in Area: -189.2% % Reduction in Volume: -474.7% Epithelialization: None Tunneling: No Undermining: No Wound Description Classification: Category/Stage II Foul Wound Margin: Flat and Intact Slou Exudate Amount: Large Exudate Type: Serous Exudate Color: amber Odor After Cleansing: No gh/Fibrino No Wound Bed Granulation Amount: None Present (0%) Exposed Structure Necrotic Amount: Large (67-100%) Fascia Exposed: No Necrotic Quality: Eschar, Adherent Slough Fat Layer (Subcutaneous Tissue) Exposed: No Tendon Exposed: No Muscle Exposed: No Joint Exposed: No Bone Exposed: No Limited to Skin Breakdown Periwound Skin Texture Texture Color No Abnormalities Noted: No No Abnormalities Noted: No [place], [name] ([id number]) Callus: No Atrophie Blanche: No Crepitus: No Cyanosis: No Excoriation: No Ecchymosis: No Induration: No Erythema: No Rash:  No Hemosiderin Staining: No Scarring: No Mottled: No Pallor: No Moisture Rubor: No No Abnormalities Noted: No Dry / Scaly: No Temperature / Pain Maceration: Yes Temperature: No Abnormality Wound Preparation Ulcer Cleansing: Rinsed/Irrigated with Saline Topical Anesthetic Applied: Other: lidocaine 4%, Treatment Notes Wound #2 (Left, Lateral Malleolus) 1. Cleansed with: Clean wound with Normal Saline 2. Anesthetic Topical Lidocaine 4% cream to wound bed prior to debridement 4. Dressing Applied: Santyl Ointment 5. Secondary Dressing Applied Bordered Foam Dressing Dry Gauze Electronic Signature(s) Signed: 05/10/2018 4:48:34 PM By: Alric Quan Entered By: Alric Quan on 05/10/2018 12:45:21 Velardi, [name] ([id number]) -------------------------------------------------------------------------------- [place] Details Patient Name: [name] Date of Service: 05/10/2018 12:30 PM Medical Record Number: [id number] Patient Account Number: [id number] Date of Birth/Sex: 1923/06/22 (84 y.o. F) Treating RN: Ahmed Prima Primary Care Blimi Godby: FITZGERALD, DAVID Other Clinician: Referring Rigo Letts: FITZGERALD, DAVID Treating Yaziel Brandon/Extender: Lawanda Cousins Weeks in Treatment: 39 Vital Signs Time Taken: 12:35 Temperature (F): 98.4 Height (in): 65 Pulse (bpm): 88 Weight (lbs): 154.3 Respiratory Rate (breaths/min): 16 Body Mass Index (BMI): 25.7 Blood Pressure (mmHg): 117/63 Reference Range: 80 - 120 mg / dl Electronic Signature(s) Signed: 05/10/2018 4:48:34 PM By: Alric Quan Entered By: Alric Quan on 05/10/2018 12:37:51

## 2018-05-17 ENCOUNTER — Encounter: Payer: Medicare Other | Admitting: Physician Assistant

## 2018-05-17 DIAGNOSIS — L89523 Pressure ulcer of left ankle, stage 3: Secondary | ICD-10-CM | POA: Diagnosis not present

## 2018-05-22 NOTE — Progress Notes (Addendum)
ESSYNCE, MUNSCH ([id number]) Visit Report for 05/17/2018 Arrival Information Details Patient Name: [name], [name] Date of Service: 05/17/2018 12:30 PM Medical Record Number: [id number] Patient Account Number: [id number] Date of Birth/Sex: Mar 02, 1923 (84 y.o. F) Treating RN: Secundino Ginger Primary Care Lindberg Zenon: FITZGERALD, DAVID Other Clinician: Referring Maribeth Jiles: FITZGERALD, DAVID Treating Lovena Kluck/Extender: STONE III, HOYT Weeks in Treatment: 29 Visit Information History Since Last Visit Added or deleted any medications: No Patient Arrived: Ambulatory Any new allergies or adverse reactions: No Arrival Time: 12:33 Had a fall or experienced change in No Accompanied By: self activities of daily living that may affect Transfer Assistance: None risk of falls: Patient Identification Verified: Yes Signs or symptoms of abuse/neglect since last visito No Secondary Verification Process Yes Hospitalized since last visit: No Completed: Has Dressing in Place as Prescribed: Yes Patient Requires Transmission-Based No Has Compression in Place as Prescribed: Yes Precautions: Pain Present Now: No Patient Has Alerts: Yes Patient Alerts: ABI AVVS 03/29/18 L 1.05 R 1.03, TBI L .56, R .85 Electronic Signature(s) Signed: 05/21/2018 3:42:29 PM By: Montey Hora Previous Signature: 05/17/2018 3:41:29 PM Version By: Secundino Ginger Entered By: Montey Hora on 05/21/2018 15:42:29 [place]ingsworth, [name] ([id number]) -------------------------------------------------------------------------------- Encounter Discharge Information Details Patient Name: [name] Date of Service: 05/17/2018 12:30 PM Medical Record Number: [id number] Patient Account Number: [id number] Date of Birth/Sex: Sep 08, 1923 (84 y.o. F) Treating RN: Ahmed Prima Primary Care Marlene Pfluger: FITZGERALD, DAVID Other Clinician: Referring Elnathan Fulford: FITZGERALD, DAVID Treating Shannan Slinker/Extender: STONE III, HOYT Weeks in Treatment:  40 Encounter Discharge Information Items Discharge Condition: Stable Ambulatory Status: Ambulatory Discharge Destination: Home Transportation: Private Auto Accompanied By: daughter Schedule Follow-up Appointment: Yes Clinical Summary of Care: Electronic Signature(s) Signed: 05/17/2018 1:29:49 PM By: Alric Quan Entered By: Alric Quan on 05/17/2018 13:29:48 Paver, [name] ([id number]) -------------------------------------------------------------------------------- Lower Extremity Assessment Details Patient Name: [name] Date of Service: 05/17/2018 12:30 PM Medical Record Number: [id number] Patient Account Number: [id number] Date of Birth/Sex: [birth year] (84 y.o. F) Treating RN: Secundino Ginger Primary Care Keshanna Riso: FITZGERALD, DAVID Other Clinician: Referring Sandip Power: FITZGERALD, DAVID Treating Bowie Delia/Extender: STONE III, HOYT Weeks in Treatment: 40 Edema Assessment Assessed: [Left: No] [Right: No] [Left: Edema] [Right: :] Calf Left: Right: Point of Measurement: 29 cm From Medial Instep 29 cm 28 cm Ankle Left: Right: Point of Measurement: 12 cm From Medial Instep 19 cm 22 cm Vascular Assessment Claudication: Claudication Assessment [Left:None] [Right:None] Pulses: Dorsalis Pedis Palpable: [Left:Yes] [Right:Yes] Posterior Tibial Extremity colors, hair growth, and conditions: Extremity Color: [Left:Hyperpigmented] [Right:Hyperpigmented] Hair Growth on Extremity: [Left:No] [Right:No] Temperature of Extremity: [Left:Warm] [Right:Warm] Capillary Refill: [Left:< 3 seconds] [Right:< 3 seconds] Toe Nail Assessment Left: Right: Thick: Yes Yes Discolored: Yes Yes Deformed: Yes Yes Improper Length and Hygiene: No No Electronic Signature(s) Signed: 05/17/2018 3:41:29 PM By: Secundino Ginger Entered By: Secundino Ginger on 05/17/2018 12:51:37 Hosein, [name] ([id number]) -------------------------------------------------------------------------------- Multi Wound  Chart Details Patient Name: [name] Date of Service: 05/17/2018 12:30 PM Medical Record Number: [id number] Patient Account Number: [id number] Date of Birth/Sex: 07/13/1923 (84 y.o. F) Treating RN: Montey Hora Primary Care Breylen Agyeman: FITZGERALD, DAVID Other Clinician: Referring Delton Stelle: FITZGERALD, DAVID Treating Kaleel Schmieder/Extender: STONE III, HOYT Weeks in Treatment: 40 Vital Signs Height(in): 65 Pulse(bpm): 91 Weight(lbs): 154.3 Blood Pressure(mmHg): 126/65 Body Mass Index(BMI): 26 Temperature(F): 98.4 Respiratory Rate 16 (breaths/min): Photos: [N/A:N/A] Wound Location: Right Malleolus - Lateral Left Malleolus - Lateral N/A Wounding Event: Gradually Appeared Gradually Appeared N/A Primary Etiology: Lymphedema Pressure Ulcer N/A Secondary Etiology: Arterial Insufficiency Ulcer N/A N/A Comorbid History: Cataracts, Congestive  Heart Cataracts, Congestive Heart N/A Failure, Hypertension, Failure, Hypertension, Osteoarthritis, Neuropathy Osteoarthritis, Neuropathy Date Acquired: 08/11/2015 04/12/2018 N/A Weeks of Treatment: 40 4 N/A Wound Status: Open Open N/A Measurements L x W x D 1.9x2.5x0.4 1.5x1x0.2 N/A (cm) Area (cm) : 3.731 1.178 N/A Volume (cm) : 1.492 0.236 N/A % Reduction in Area: -143.50% -50.10% N/A % Reduction in Volume: -225.10% -198.70% N/A Classification: Full Thickness With Exposed Category/Stage II N/A Support Structures Exudate Amount: Large Small N/A Exudate Type: Serous Serous N/A Exudate Color: amber amber N/A Wound Margin: Distinct, outline attached Flat and Intact N/A Granulation Amount: None Present (0%) None Present (0%) N/A Necrotic Amount: Large (67-100%) Large (67-100%) N/A Necrotic Tissue: Eschar, Adherent Slough Eschar, Adherent Slough N/A Exposed Structures: Fascia: Yes Fascia: No N/A Fat Layer (Subcutaneous Fat Layer (Subcutaneous Tissue) Exposed: Yes Tissue) Exposed: No Tendon: No Heap, [name] ([id number]) Muscle:  No Joint: No Bone: No Limited to Skin Breakdown Epithelialization: None None N/A Periwound Skin Texture: Excoriation: No Excoriation: No N/A Induration: No Induration: No Callus: No Callus: No Crepitus: No Crepitus: No Rash: No Rash: No Scarring: No Scarring: No Periwound Skin Moisture: Maceration: Yes Maceration: Yes N/A Dry/Scaly: Yes Dry/Scaly: Yes Periwound Skin Color: Erythema: Yes Atrophie Blanche: No N/A Rubor: Yes Cyanosis: No Atrophie Blanche: No Ecchymosis: No Cyanosis: No Erythema: No Ecchymosis: No Hemosiderin Staining: No Hemosiderin Staining: No Mottled: No Mottled: No Pallor: No Pallor: No Rubor: No Erythema Location: Circumferential N/A N/A Temperature: No Abnormality No Abnormality N/A Tenderness on Palpation: Yes No N/A Wound Preparation: Ulcer Cleansing: Ulcer Cleansing: N/A Rinsed/Irrigated with Saline Rinsed/Irrigated with Saline Topical Anesthetic Applied: Topical Anesthetic Applied: Other: lidocaine 4% Other: lidocaine 4% Treatment Notes Electronic Signature(s) Signed: 05/17/2018 4:47:18 PM By: Montey Hora Entered By: Montey Hora on 05/17/2018 13:07:21 Mcdougall, [name] ([id number]) -------------------------------------------------------------------------------- [place] Details Patient Name: [name] Date of Service: 05/17/2018 12:30 PM Medical Record Number: [id number] Patient Account Number: [id number] Date of Birth/Sex: 1923/02/08 (84 y.o. F) Treating RN: Montey Hora Primary Care Bettie Capistran: FITZGERALD, DAVID Other Clinician: Referring Ariabella Brien: FITZGERALD, DAVID Treating Jonan Seufert/Extender: STONE III, HOYT Weeks in Treatment: 40 Active Inactive ` Abuse / Safety / Falls / Self Care Management Nursing Diagnoses: Potential for falls Goals: Patient will not experience any injury related to falls Date Initiated: 08/10/2017 Target Resolution Date: 11/10/2017 Goal Status:  Active Interventions: Assess Activities of Daily Living upon admission and as needed Assess fall risk on admission and as needed Assess: immobility, friction, shearing, incontinence upon admission and as needed Notes: ` Nutrition Nursing Diagnoses: Imbalanced nutrition Potential for alteratiion in Nutrition/Potential for imbalanced nutrition Goals: Patient/caregiver agrees to and verbalizes understanding of need to use nutritional supplements and/or vitamins as prescribed Date Initiated: 08/10/2017 Target Resolution Date: 12/08/2017 Goal Status: Active Interventions: Assess patient nutrition upon admission and as needed per policy Notes: ` Orientation to the Wound Care Program Nursing Diagnoses: Knowledge deficit related to the wound healing center program Goals: Patient/caregiver will verbalize understanding of the [place] Program Date Initiated: 08/10/2017 Target Resolution Date: 09/08/2017 DAYSI, BOGGAN ([id number]) Goal Status: Active Interventions: Provide education on orientation to the wound center Notes: ` Pain, Acute or Chronic Nursing Diagnoses: Pain, acute or chronic: actual or potential Potential alteration in comfort, pain Goals: Patient/caregiver will verbalize adequate pain control between visits Date Initiated: 08/10/2017 Target Resolution Date: 12/08/2017 Goal Status: Active Interventions: Complete pain assessment as per visit requirements Notes: ` Wound/Skin Impairment Nursing Diagnoses: Impaired tissue integrity Knowledge deficit related to ulceration/compromised skin integrity Goals:  Ulcer/skin breakdown will have a volume reduction of 80% by week 12 Date Initiated: 08/10/2017 Target Resolution Date: 12/01/2017 Goal Status: Active Interventions: Assess patient/caregiver ability to perform ulcer/skin care regimen upon admission and as needed Notes: Electronic Signature(s) Signed: 05/17/2018 4:47:18 PM By: Montey Hora Entered By:  Montey Hora on 05/17/2018 13:07:13 Carter, [name] ([id number]) -------------------------------------------------------------------------------- Pain Assessment Details Patient Name: [name] Date of Service: 05/17/2018 12:30 PM Medical Record Number: [id number] Patient Account Number: [id number] Date of Birth/Sex: [birth year] (84 y.o. F) Treating RN: Secundino Ginger Primary Care Magalie Almon: FITZGERALD, DAVID Other Clinician: Referring Emilynn Srinivasan: FITZGERALD, DAVID Treating Ahmod Gillespie/Extender: STONE III, HOYT Weeks in Treatment: 40 Active Problems Location of Pain Severity and Description of Pain Patient Has Paino No Site Locations Pain Management and Medication Current Pain Management: Goals for Pain Management Topical or injectable lidocaine is offered to patient for acute pain when surgical debridement is performed. If needed, Patient is instructed to use over the counter pain medication for the following 24-48 hours after debridement. Wound care MDs do not prescribed pain medications. Patient has chronic pain or uncontrolled pain. Patient has been instructed to make an appointment with their Primary Care Physician for pain management. Electronic Signature(s) Signed: 05/17/2018 3:41:29 PM By: Secundino Ginger Entered By: Secundino Ginger on 05/17/2018 12:37:26 Cardamone, [name] ([id number]) -------------------------------------------------------------------------------- Patient/Caregiver Education Details Patient Name: [name] Date of Service: 05/17/2018 12:30 PM Medical Record Number: [id number] Patient Account Number: [id number] Date of Birth/Gender: May 15, 1923 (84 y.o. F) Treating RN: Ahmed Prima Primary Care Physician: FITZGERALD, DAVID Other Clinician: Referring Physician: FITZGERALD, DAVID Treating Physician/Extender: Melburn Hake, HOYT Weeks in Treatment: 40 Education Assessment Education Provided To: Patient Education Topics Provided Wound/Skin  Impairment: Handouts: Caring for Your Ulcer, Skin Care Do's and Dont's, Other: change dressing as ordered Methods: Demonstration, Explain/Verbal Responses: State content correctly Electronic Signature(s) Signed: 05/20/2018 5:21:58 PM By: Alric Quan Entered By: Alric Quan on 05/17/2018 13:30:04 Shugars, [name] ([id number]) -------------------------------------------------------------------------------- Wound Assessment Details Patient Name: [name] Date of Service: 05/17/2018 12:30 PM Medical Record Number: [id number] Patient Account Number: [id number] Date of Birth/Sex: [birth year] (84 y.o. F) Treating RN: Secundino Ginger Primary Care Sherrise Liberto: FITZGERALD, DAVID Other Clinician: Referring Madeeha Costantino: FITZGERALD, DAVID Treating Miyani Cronic/Extender: STONE III, HOYT Weeks in Treatment: 40 Wound Status Wound Number: 1 Primary Lymphedema Etiology: Wound Location: Right Malleolus - Lateral Secondary Arterial Insufficiency Ulcer Wounding Event: Gradually Appeared Etiology: Date Acquired: 08/11/2015 Wound Status: Open Weeks Of Treatment: 40 Comorbid Cataracts, Congestive Heart Failure, Clustered Wound: No History: Hypertension, Osteoarthritis, Neuropathy Photos Photo Uploaded By: Secundino Ginger on 05/17/2018 12:57:14 Wound Measurements Length: (cm) 1.9 Width: (cm) 2.5 Depth: (cm) 0.4 Area: (cm) 3.731 Volume: (cm) 1.492 % Reduction in Area: -143.5% % Reduction in Volume: -225.1% Epithelialization: None Tunneling: No Undermining: No Wound Description Full Thickness With Exposed Support Classification: Structures Wound Margin: Distinct, outline attached Exudate Large Amount: Exudate Type: Serous Exudate Color: amber Foul Odor After Cleansing: No Slough/Fibrino Yes Wound Bed Granulation Amount: None Present (0%) Exposed Structure Necrotic Amount: Large (67-100%) Fascia Exposed: Yes Necrotic Quality: Eschar, Adherent Slough Fat Layer (Subcutaneous Tissue)  Exposed: Yes Periwound Skin Texture Texture Color No Abnormalities Noted: No No Abnormalities Noted: No Mandigo, [name] ([id number]) Callus: No Atrophie Blanche: No Crepitus: No Cyanosis: No Excoriation: No Ecchymosis: No Induration: No Erythema: Yes Rash: No Erythema Location: Circumferential Scarring: No Hemosiderin Staining: No Mottled: No Moisture Pallor: No No Abnormalities Noted: No Rubor: Yes Dry / Scaly: Yes Maceration: Yes Temperature / Pain Temperature: No Abnormality Tenderness  on Palpation: Yes Wound Preparation Ulcer Cleansing: Rinsed/Irrigated with Saline Topical Anesthetic Applied: Other: lidocaine 4%, Treatment Notes Wound #1 (Right, Lateral Malleolus) 1. Cleansed with: Clean wound with Normal Saline 2. Anesthetic Topical Lidocaine 4% cream to wound bed prior to debridement 4. Dressing Applied: Santyl Ointment 5. Secondary Dressing Applied Bordered Foam Dressing Notes drawtex Electronic Signature(s) Signed: 05/17/2018 3:41:29 PM By: Secundino Ginger Entered By: Secundino Ginger on 05/17/2018 12:46:32 Kosh, [name] ([id number]) -------------------------------------------------------------------------------- Wound Assessment Details Patient Name: [name] Date of Service: 05/17/2018 12:30 PM Medical Record Number: [id number] Patient Account Number: [id number] Date of Birth/Sex: Mar 04, 1923 (84 y.o. F) Treating RN: Secundino Ginger Primary Care Tiffny Gemmer: FITZGERALD, DAVID Other Clinician: Referring Geary Rufo: FITZGERALD, DAVID Treating Ziara Thelander/Extender: STONE III, HOYT Weeks in Treatment: 40 Wound Status Wound Number: 2 Primary Pressure Ulcer Etiology: Wound Location: Left Malleolus - Lateral Wound Open Wounding Event: Gradually Appeared Status: Date Acquired: 04/12/2018 Comorbid Cataracts, Congestive Heart Failure, Weeks Of Treatment: 4 History: Hypertension, Osteoarthritis, Neuropathy Clustered Wound: No Photos Photo Uploaded By: Secundino Ginger  on 05/17/2018 12:57:15 Wound Measurements Length: (cm) 1.5 Width: (cm) 1 Depth: (cm) 0.2 Area: (cm) 1.178 Volume: (cm) 0.236 % Reduction in Area: -50.1% % Reduction in Volume: -198.7% Epithelialization: None Tunneling: No Undermining: No Wound Description Classification: Category/Stage II Foul Odor A Wound Margin: Flat and Intact Slough/Fibr Exudate Amount: Small Exudate Type: Serous Exudate Color: amber fter Cleansing: No ino Yes Wound Bed Granulation Amount: None Present (0%) Exposed Structure Necrotic Amount: Large (67-100%) Fascia Exposed: No Necrotic Quality: Eschar, Adherent Slough Fat Layer (Subcutaneous Tissue) Exposed: No Tendon Exposed: No Muscle Exposed: No Joint Exposed: No Bone Exposed: No Limited to Skin Breakdown Periwound Skin Texture Clagg, [name] ([id number]) Texture Color No Abnormalities Noted: No No Abnormalities Noted: No Callus: No Atrophie Blanche: No Crepitus: No Cyanosis: No Excoriation: No Ecchymosis: No Induration: No Erythema: No Rash: No Hemosiderin Staining: No Scarring: No Mottled: No Pallor: No Moisture Rubor: No No Abnormalities Noted: No Dry / Scaly: Yes Temperature / Pain Maceration: Yes Temperature: No Abnormality Wound Preparation Ulcer Cleansing: Rinsed/Irrigated with Saline Topical Anesthetic Applied: Other: lidocaine 4%, Treatment Notes Wound #2 (Left, Lateral Malleolus) 1. Cleansed with: Clean wound with Normal Saline 2. Anesthetic Topical Lidocaine 4% cream to wound bed prior to debridement 4. Dressing Applied: Santyl Ointment 5. Secondary Dressing Applied Bordered Foam Dressing Notes drawtex Electronic Signature(s) Signed: 05/17/2018 3:41:29 PM By: Secundino Ginger Entered By: Secundino Ginger on 05/17/2018 12:48:01 Cabreja, [name] ([id number]) -------------------------------------------------------------------------------- [place] Details Patient Name: [name] Date of Service: 05/17/2018  12:30 PM Medical Record Number: [id number] Patient Account Number: [id number] Date of Birth/Sex: [birth year] (84 y.o. F) Treating RN: Secundino Ginger Primary Care Aubreanna Percle: FITZGERALD, DAVID Other Clinician: Referring Joshwa Hemric: FITZGERALD, DAVID Treating Dontrail Blackwell/Extender: STONE III, HOYT Weeks in Treatment: 40 Vital Signs Time Taken: 12:37 Temperature (F): 98.4 Height (in): 65 Pulse (bpm): 91 Weight (lbs): 154.3 Respiratory Rate (breaths/min): 16 Body Mass Index (BMI): 25.7 Blood Pressure (mmHg): 126/65 Reference Range: 80 - 120 mg / dl Electronic Signature(s) Signed: 05/17/2018 3:41:29 PM By: Secundino Ginger Entered By: Secundino Ginger on 05/17/2018 12:37:59

## 2018-05-22 NOTE — Progress Notes (Signed)
SHIZA, THELEN ([id number]) Visit Report for 05/17/2018 Chief Complaint Document Details Patient Name: [name], [name]. Date of Service: 05/17/2018 12:30 PM Medical Record Number: [id number] Patient Account Number: [id number] Date of Birth/Sex: [birth year] (84 y.o. F) Treating RN: Montey Hora Primary Care Provider: FITZGERALD, DAVID Other Clinician: Referring Provider: FITZGERALD, DAVID Treating Provider/Extender: STONE III, Johnnae Impastato Weeks in Treatment: 40 Information Obtained from: Patient Chief Complaint Patient is here for right lateral malleolus and left lateral malleolus ulcer Electronic Signature(s) Signed: 05/19/2018 11:18:14 PM By: Worthy Keeler PA-C Entered By: Worthy Keeler on 05/17/2018 13:04:44 Peifer, [name] ([id number]) -------------------------------------------------------------------------------- Debridement Details Patient Name: [name] Date of Service: 05/17/2018 12:30 PM Medical Record Number: [id number] Patient Account Number: [id number] Date of Birth/Sex: 1923-01-18 (84 y.o. F) Treating RN: Montey Hora Primary Care Provider: FITZGERALD, DAVID Other Clinician: Referring Provider: FITZGERALD, DAVID Treating Provider/Extender: STONE III, Bari Handshoe Weeks in Treatment: 40 Debridement Performed for Wound #1 Right,Lateral Malleolus Assessment: Performed By: Physician STONE III, Vanassa Penniman E., PA-C Debridement Type: Debridement Severity of Tissue Pre Other severity specified Debridement: Pre-procedure Verification/Time Yes - 13:09 Out Taken: Start Time: 13:09 Pain Control: Lidocaine 4% Topical Solution Total Area Debrided (L x W): 1.9 (cm) x 2.5 (cm) = 4.75 (cm) Tissue and other material Viable, Non-Viable, Slough, Subcutaneous, Skin: Dermis , Skin: Epidermis, Fibrin/Exudate, debrided: Slough Level: Skin/Subcutaneous Tissue Debridement Description: Excisional Instrument: Curette Bleeding: Minimum Hemostasis Achieved: Pressure End Time:  13:11 Procedural Pain: 0 Post Procedural Pain: 0 Response to Treatment: Procedure was tolerated well Level of Consciousness: Awake and Alert Post Procedure Vitals: Temperature: 98.4 Pulse: 91 Respiratory Rate: 16 Blood Pressure: Systolic Blood Pressure: 673 Diastolic Blood Pressure: 63 Post Debridement Measurements of Total Wound Length: (cm) 1.9 Width: (cm) 2.5 Depth: (cm) 0.5 Volume: (cm) 1.865 Character of Wound/Ulcer Post Debridement: Improved Severity of Tissue Post Debridement: Other severity specified Post Procedure Diagnosis Same as Pre-procedure Notes fascia exposed Electronic Signature(s) Bryar, Rennie [name] ([id number]) Signed: 05/17/2018 4:47:18 PM By: Montey Hora Signed: 05/19/2018 11:18:14 PM By: Worthy Keeler PA-C Entered By: Montey Hora on 05/17/2018 13:11:10 Gondek, [name] ([id number]) -------------------------------------------------------------------------------- Debridement Details Patient Name: [name] Date of Service: 05/17/2018 12:30 PM Medical Record Number: [id number] Patient Account Number: [id number] Date of Birth/Sex: [birth year] (84 y.o. F) Treating RN: Montey Hora Primary Care Provider: FITZGERALD, DAVID Other Clinician: Referring Provider: FITZGERALD, DAVID Treating Provider/Extender: STONE III, Jerrian Mells Weeks in Treatment: 40 Debridement Performed for Wound #2 Left,Lateral Malleolus Assessment: Performed By: Physician STONE III, Davontae Prusinski E., PA-C Debridement Type: Debridement Pre-procedure Verification/Time Yes - 13:11 Out Taken: Start Time: 13:11 Pain Control: Lidocaine 4% Topical Solution Total Area Debrided (L x W): 1.5 (cm) x 1 (cm) = 1.5 (cm) Tissue and other material Viable, Non-Viable, Slough, Subcutaneous, Fibrin/Exudate, Slough debrided: Level: Skin/Subcutaneous Tissue Debridement Description: Excisional Instrument: Curette Bleeding: Minimum Hemostasis Achieved: Pressure End Time: 13:13 Procedural  Pain: 0 Post Procedural Pain: 0 Response to Treatment: Procedure was tolerated well Level of Consciousness: Awake and Alert Post Procedure Vitals: Temperature: 98.4 Pulse: 91 Respiratory Rate: 16 Blood Pressure: Systolic Blood Pressure: 622 Diastolic Blood Pressure: 63 Post Debridement Measurements of Total Wound Length: (cm) 1.5 Stage: Category/Stage II Width: (cm) 1 Depth: (cm) 0.3 Volume: (cm) 0.353 Character of Wound/Ulcer Post Improved Debridement: Post Procedure Diagnosis Same as Pre-procedure Electronic Signature(s) Signed: 05/17/2018 4:47:18 PM By: Montey Hora Signed: 05/19/2018 11:18:14 PM By: Worthy Keeler PA-C Entered By: Montey Hora on 05/17/2018 13:12:20 Bednarz, [name] ([id number]) Gunnels, [name] ([id number]) -------------------------------------------------------------------------------- HPI Details Patient  Name: [name]. Date of Service: 05/17/2018 12:30 PM Medical Record Number: [id number] Patient Account Number: [id number] Date of Birth/Sex: March 19, 1923 (84 y.o. 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 Dr. Amalia Hailey the podiatrist saw her on 07/20/2017 and sent her to the wound center for possible hyperbaric oxygen therapy. past medical history of peripheral vascular disease, varicose veins, status post appendectomy, basal cell carcinoma excision from the left leg, cholecystectomy, pacemaker placement, right lower extremity angiography done by Dr. dew in March 2017 with placement of a stent. there is also note of a successful ablation of the right  small saphenous vein done which was reviewed by ultrasound on 10/24/2016. the patient had a right small saphenous vein ablation done on 10/20/2016. The patient has never been a smoker. She has been seen by Dr. Corene Cornea dew the vascular surgeon who most recently saw her on 06/15/2017 for evaluation of ongoing problems with right leg swelling. She had a lower extremity arterial duplex examination done(02/13/17) which showed patent distal right superficial femoral artery stent and above-the-knee popliteal stent without evidence of restenosis. The ABI was more than 1.3 on the right and more than 1.3 on the left. This was consistent with noncompressible arteries due to medial calcification. The right great toe pressure and PPG waveforms are within normal limits and the left great toe pressure and PPG waveforms are decreased. he recommended she continue to wear her compression stockings and continue with elevation. She is scheduled to have a noninvasive arterial study in the near future 08/16/2017 -- had a lower extremity arterial duplex examination done which showed patent distal right superficial femoral artery stent and above-the-knee popliteal stent without evidence of restenosis. The ABI was more than 1.3 on the right and more than 1.3 on the left. This was consistent with noncompressible arteries due to medial calcification. The right great toe pressure and PPG waveforms are within normal limits and the left great toe pressure and PPG waveforms are decreased. the x-ray of the right ankle has not yet been done 08/24/2017 -- had a right ankle x-ray -- IMPRESSION:1. No fracture, bone lesion or evidence of osteomyelitis. 2. Lateral soft tissue swelling with a soft tissue ulcer. she has not yet seen the vascular surgeon for review 08/31/17 on evaluation today patient's wound appears to be showing signs of improvement. She still with her appointment with vascular in order to review her results of her  vascular study and then determine if any intervention would be recommended at that time. No fevers, chills, nausea, or vomiting noted at this time. She has been tolerating the dressing changes without complication. 09/28/17 on evaluation today patient's wound appears to show signs of good improvement in regard to the granulation tissue which is surfacing. There is still a layer of slough covering the wound and the posterior portion is still significantly deeper than the anterior nonetheless there has been some good sign of things moving towards the better. She is going to go back to Dr. dew for reevaluation to ensure her blood flow is still appropriate. That will be before her next evaluation with [place] next week. No fevers, chills, nausea, or vomiting noted at this time. Patient does have some  discomfort rated to be a 3-4/10 depending on activity specifically cleansing the wound makes it worse. 10/05/2017 -- the patient was seen by Dr. Lucky Cowboy last week and noninvasive studies showed a normal right ABI with brisk triphasic waveforms consistent with no arterial insufficiency including normal digital pressures. The duplex showed a patent distal right SFA stent and the proximal SFA was also normal. He was pleased with her test and thought she should have enough of perfusion for normal wound healing. He would see her back in 6 months time. 12/21/17 on evaluation today patient appears to be doing fairly well in regard to her right lateral ankle wound. Unfortunately the main issue that she is expansion at this point is that she is having some issues with what appears to be some cellulitis in the TOMEIKA, WEINMANN. ([id number]) right anterior shin. She has also been noting a little bit of uncomfortable feeling especially last night and her ankle area. I'm afraid that she made the developing a little bit of an infection. With that being said I think it is in the early stages. 12/28/17 on evaluation today patient's  ankle appears to be doing excellent. She's making good progress at this point the cellulitis seems to have improved after last week's evaluation. Overall she is having no significant discomfort which is excellent news. She does have an appointment with Dr. dew on March 29, 2018 for reevaluation in regard to the stent he placed. She seems to have excellent blood flow in the right lower extremity. 01/19/12 on evaluation today patient's wound appears to be doing very well. In fact she does not appear to require debridement at this point, there's no evidence of infection, and overall from the standpoint of the wound she seems to be doing very well. With that being said I believe that it may be time to switch to different dressing away from the [facility] Dressing she tells me she does have a lot going on her friend actually passed away yesterday and she's also having a lot of issues with her husband this obviously is weighing heavy on her as far as your thoughts and concerns today. 01/25/18 on evaluation today patient appears to be doing fairly well in regard to her right lateral malleolus. She has been tolerating the dressing changes without complication. Overall I feel like this is definitely showing signs of improvement as far as how the overall appearance of the wound is there's also evidence of epithelium start to migrate over the granulation tissue. In general I think that she is progressing nicely as far as the wound is concerned. The only concern she really has is whether or not we can switch to every other week visits in order to avoid having as many appointments as her daughters have a difficult time getting her to her appointments as well as the patient's husband to his he is not doing very well at this point. 02/22/18 on evaluation today patient's right lateral malleolus ulcer appears to be doing great. She has been tolerating the dressing changes without complication. Overall you making  excellent progress at this time. Patient is having no significant discomfort. 03/15/18 on evaluation today patient appears to be doing much more poorly in regard to her right lateral ankle ulcer at this point. Unfortunately since have last seen her her husband has passed just a few days ago is obviously weighed heavily on her her daughter also had surgery well she is with her today as usual. There does not appear to  be any evidence of infection she does seem to have significant contusion/deep tissue injury to the right lateral malleolus which was not noted previous when I saw her last. It's hard to tell of exactly when this injury occurred although during the time she was spending the night in the hospital this may have been most likely. 03/22/18 on evaluation today patient appears to actually be doing very well in regard to her ulcer. She did unfortunately have a setback which was noted last week however the good news is we seem to be getting back on track and in fact the wound in the core did still have some necrotic tissue which will be addressed at this point today but in general I'm seeing signs that things are on the up and up. She is glad to hear this obviously she's been somewhat concerned that due to the how her wound digressed more recently. 03/29/18 on evaluation today patient appears to be doing fairly well in regard to her right lower extremity lateral malleolus ulcer. She unfortunately does have a new area of pressure injury over the inferior portion where the wound has opened up a little bit larger secondary to the pressure she seems to be getting. She does tell me sometimes when she sleeps at night that it actually hurts and does seem to be pushing on the area little bit more unfortunately. There does not appear to be any evidence of infection which is good news. She has been tolerating the dressing changes without complication. She also did have some bruising in the left second and  third toes due to the fact that she may have bump this or injured it although she has neuropathy so she does not feel she did move recently that may have been where this came from. Nonetheless there does not appear to be any evidence of infection at this time. 04/12/18 on evaluation today patient's wound on the right lateral ankle actually appears to be doing a little bit better with a lot of necrotic docking tissue centrally loosening up in clearing away. However she does have the beginnings of a deep tissue injury on the left lateral malleolus likely due to the fact we've been trying offload the right as much as we have. I think she may benefit from an assistive soft device to help with offloading and it looks like they're looking at one of the doughnut conditions that wraps around the lower leg to offload which I think will definitely do a good job. With that being said I think we definitely need to address this issue on the left before it becomes a wound. Patient is not having significant pain. 04/19/18 on evaluation today patient appears to be doing excellent in regard to the progress she's made with her right lateral ankle ulcer. The left ankle region which did show evidence of a deep tissue injury seems to be resolving there's little fluid noted underneath and a blister there's nothing open at this point in time overall I feel like this is progressing nicely which is good news. She does not seem to be having significant discomfort at this point which is also good news. 04/25/18-She is here in follow up evaluation for bilateral lateral malleolar ulcers. The right lateral malleolus ulcer with pale subcutaneous tissue exposure, central area of ulcer with tendon/periosteum exposed. The left lateral malleolus ulcer now with Robar, [name] ([id number]) central area of nonviable tissue, otherwise deep tissue injury. She is wearing compression wraps to the left lower extremity,  she will place the  right lower extremity compression wraps on when she gets home. She will be out of town over the weekend and return next week and follow-up appointment. She completed her doxycycline this morning 05/03/18 on evaluation today patient appears to be doing very well in regard to her right lateral ankle ulcer in general. At least she's showing some signs of improvement in this regard. Unfortunately she has some additional injury to the left lateral malleolus region which appears to be new likely even over the past several days. Again this determination is based on the overall appearance. With that being said the patient is obviously frustrated about this currently. 05/10/18-She is here in follow-up evaluation for bilateral lateral malleolar ulcers. She states she has purchased offloading shoes/boots and they will arrive tomorrow. She was asked to bring them in the office at next week's appointment so her provider is aware of product being utilized. She continues to sleep on right or left side, she has been encouraged to sleep on her back. The right lateral malleolus ulcer is precariously close to peri-osteum; will order xray. The left lateral malleolus ulcer is improved. Will switch back to santyl; she will follow up next week. 05/17/18 on evaluation today patient actually appears to be doing very well in regard to her malleolus her ulcers compared to last time I saw them. She does not seem to have as much in the way of contusion at this point which is great news. With that being said she does continue to have discomfort and I do believe that she is still continuing to benefit from the offloading/pressure reducing boots that were recommended. I think this is the key to trying to get this to heal up completely. Electronic Signature(s) Signed: 05/19/2018 11:18:14 PM By: Worthy Keeler PA-C Entered By: Worthy Keeler on 05/19/2018 23:16:16 Bauernfeind, [name]  ([id number]) -------------------------------------------------------------------------------- Physical Exam Details Patient Name: [name] Date of Service: 05/17/2018 12:30 PM Medical Record Number: [id number] Patient Account Number: [id number] Date of Birth/Sex: Sep 09, 1923 (84 y.o. F) Treating RN: Montey Hora Primary Care Provider: FITZGERALD, DAVID Other Clinician: Referring Provider: FITZGERALD, DAVID Treating Provider/Extender: STONE III, Terence Bart Weeks in Treatment: 62 Constitutional Well-nourished and well-hydrated in no acute distress. Respiratory normal breathing without difficulty. Psychiatric this patient is able to make decisions and demonstrates good insight into disease process. Alert and Oriented x 3. pleasant and cooperative. Notes On inspection at this point patient's wounds did have some Slough noted on the surface of the wound bed which did require sharp debridement today. She tolerated this without complication and post debridement the wound bed did appear to be doing better although there still slough and still I think she is going to require some additional treatment to see this fully resolved. Electronic Signature(s) Signed: 05/19/2018 11:18:14 PM By: Worthy Keeler PA-C Entered By: Worthy Keeler on 05/19/2018 23:17:18 Doyel, [name] ([id number]) -------------------------------------------------------------------------------- Physician Orders Details Patient Name: [name] Date of Service: 05/17/2018 12:30 PM Medical Record Number: [id number] Patient Account Number: [id number] Date of Birth/Sex: [birth year] (84 y.o. 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 o Cleanse wound with mild soap and water o May Shower, gently pat wound dry prior to applying new dressing. Wound #2 Left,Lateral Malleolus o Clean wound with Normal Saline. o Cleanse wound with mild soap and water o May Shower, gently pat wound dry prior to applying new dressing. Anesthetic (add to Medication List) Wound #1 Right,Lateral Malleolus o Topical Lidocaine 4% cream applied to wound bed prior to debridement (In Clinic Only). Wound #2 Left,Lateral Malleolus o Topical Lidocaine 4% cream applied to wound bed prior to debridement (In Clinic Only). Primary Wound Dressing Wound #1 Right,Lateral Malleolus o Santyl Ointment Wound #2 Left,Lateral Malleolus o Santyl Ointment Secondary Dressing Wound #1 Right,Lateral Malleolus o Boardered Foam Dressing o Drawtex Wound #2 Left,Lateral Malleolus o Saline moistened gauze o Boardered Foam Dressing Ide, [name] ([id number]) Dressing Change Frequency Wound #1 Right,Lateral Malleolus o Change dressing every day. Wound #2 Left,Lateral Malleolus o Change dressing every day. Follow-up Appointments Wound #1 Right,Lateral Malleolus o Return Appointment in 1 week. Wound #2 Left,Lateral Malleolus o Return Appointment in 1 week. Edema Control Wound #1 Right,Lateral Malleolus o Elevate legs to the level of the heart and pump ankles as often as possible Wound #2 Left,Lateral Malleolus o Elevate legs to the level of the heart and pump ankles as often as possible Off-Loading Wound #1 Right,Lateral Malleolus o Turn and reposition every 2 hours o Other: - do not put pressure on your right ankle Wound #2 Left,Lateral  Malleolus o Turn and reposition every 2 hours o Other: - do not put pressure on your right ankle Additional Orders / Instructions Wound #1 Right,Lateral Malleolus o Increase protein intake. o Other: - Add Vitamin C, Zinc, Vitamin A to your diet Wound #2 Left,Lateral Malleolus o Increase protein intake. o Other: - Add Vitamin C, Zinc, Vitamin A to your diet Electronic Signature(s) Signed: 05/17/2018 4:47:18 PM By: Montey Hora Signed: 05/19/2018 11:18:14 PM By: Worthy Keeler PA-C Entered By: Montey Hora on 05/17/2018 13:13:41 Modesitt, [name] ([id number]) -------------------------------------------------------------------------------- Problem List Details Patient Name: [name] Date of Service: 05/17/2018 12:30 PM Medical Record Number: [id number] Patient Account Number: [id number] Date of Birth/Sex: 1923/05/18 (84 y.o. 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, stage 4 05/10/2018 No Yes I70.233 Atherosclerosis of native arteries of right leg with ulceration of 08/10/2017 No Yes ankle I89.0 Lymphedema, not elsewhere classified 08/10/2017 No Yes B35.4 Tinea corporis 09/28/2017 No Yes L89.523 Pressure ulcer of left ankle, stage 3 04/25/2018 No Yes Inactive Problems Resolved Problems Electronic Signature(s) Signed: 05/19/2018 11:18:14 PM By: Worthy Keeler PA-C Entered By: Worthy Keeler on 05/17/2018 13:04:37 Brockbank, [name] ([id number]) -------------------------------------------------------------------------------- Progress Note Details Patient Name: [name] Date of Service: 05/17/2018 12:30 PM Medical Record Number: [id number] Patient Account Number:  [id number] Date of Birth/Sex: Apr 13, 1923 (84 y.o. 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 Dr. Amalia Hailey the podiatrist saw her on 07/20/2017 and sent her to the wound center for possible hyperbaric oxygen therapy. past medical history of peripheral vascular disease, varicose veins, status post appendectomy, basal cell carcinoma excision from the left leg, cholecystectomy, pacemaker placement, right lower extremity angiography done by Dr. dew in March 2017 with placement of a stent. there is also note of a successful ablation of the right small saphenous vein done which was reviewed by ultrasound on 10/24/2016. the patient had a right small saphenous vein ablation done on 10/20/2016. The patient has never been a smoker. She has been seen by Dr. Corene Cornea dew the vascular surgeon who most recently saw her on 06/15/2017 for evaluation of ongoing problems with right leg swelling. She had a lower extremity arterial duplex examination done(02/13/17) which showed patent distal right superficial femoral artery stent and above-the-knee popliteal stent without evidence of restenosis. The ABI was more than 1.3 on the right and more than 1.3 on the left. This was consistent with noncompressible arteries due to medial calcification. The right great toe pressure and PPG waveforms are within normal limits and the left great toe pressure and PPG waveforms are decreased. he recommended she continue to wear  her compression stockings and continue with elevation. She is scheduled to have a noninvasive arterial study in the near future 08/16/2017 -- had a lower extremity arterial duplex examination done which showed patent distal right superficial femoral artery stent and above-the-knee popliteal stent without evidence of restenosis. The ABI was more than 1.3 on the right and more than 1.3 on the left. This was consistent with noncompressible arteries due to medial calcification. The right great toe pressure and PPG waveforms are within normal limits and the left great toe pressure and PPG waveforms are decreased. the x-ray of the right ankle has not yet been done 08/24/2017 -- had a right ankle x-ray -- IMPRESSION:1. No fracture, bone lesion or evidence of osteomyelitis. 2. Lateral soft tissue swelling with a soft tissue ulcer. she has not yet seen the vascular surgeon for review 08/31/17 on evaluation today patient's wound appears to be showing signs of improvement. She still with her appointment with vascular in order to review her results of her vascular study and then determine if any intervention would be recommended at that time. No fevers, chills, nausea, or vomiting noted at this time. She has been tolerating the dressing changes without complication. 09/28/17 on evaluation today patient's wound appears to show signs of good improvement in regard to the granulation tissue which is surfacing. There is still a layer of slough covering the wound and the posterior portion is still significantly deeper than the anterior nonetheless there has been some good sign of things moving towards the better. She is going to go back to Dr. dew for reevaluation to ensure her blood flow is still appropriate. That will be before her next evaluation with [place] next week. No fevers, chills, nausea, or vomiting noted at this time. Patient does have some discomfort rated to be a 3-4/10 depending on activity specifically  cleansing the wound makes it worse. 10/05/2017 -- the patient was seen by Dr. Lucky Cowboy last week and noninvasive studies showed a normal right ABI with brisk JONNAE, FONSECA. ([id number]) triphasic waveforms consistent with no arterial insufficiency including normal digital pressures. The duplex showed a patent distal right SFA stent and the proximal SFA was also normal. He was pleased with her test and thought she should  have enough of perfusion for normal wound healing. He would see her back in 6 months time. 12/21/17 on evaluation today patient appears to be doing fairly well in regard to her right lateral ankle wound. Unfortunately the main issue that she is expansion at this point is that she is having some issues with what appears to be some cellulitis in the right anterior shin. She has also been noting a little bit of uncomfortable feeling especially last night and her ankle area. I'm afraid that she made the developing a little bit of an infection. With that being said I think it is in the early stages. 12/28/17 on evaluation today patient's ankle appears to be doing excellent. She's making good progress at this point the cellulitis seems to have improved after last week's evaluation. Overall she is having no significant discomfort which is excellent news. She does have an appointment with Dr. dew on March 29, 2018 for reevaluation in regard to the stent he placed. She seems to have excellent blood flow in the right lower extremity. 01/19/12 on evaluation today patient's wound appears to be doing very well. In fact she does not appear to require debridement at this point, there's no evidence of infection, and overall from the standpoint of the wound she seems to be doing very well. With that being said I believe that it may be time to switch to different dressing away from the [facility] Dressing she tells me she does have a lot going on her friend actually passed away yesterday and she's  also having a lot of issues with her husband this obviously is weighing heavy on her as far as your thoughts and concerns today. 01/25/18 on evaluation today patient appears to be doing fairly well in regard to her right lateral malleolus. She has been tolerating the dressing changes without complication. Overall I feel like this is definitely showing signs of improvement as far as how the overall appearance of the wound is there's also evidence of epithelium start to migrate over the granulation tissue. In general I think that she is progressing nicely as far as the wound is concerned. The only concern she really has is whether or not we can switch to every other week visits in order to avoid having as many appointments as her daughters have a difficult time getting her to her appointments as well as the patient's husband to his he is not doing very well at this point. 02/22/18 on evaluation today patient's right lateral malleolus ulcer appears to be doing great. She has been tolerating the dressing changes without complication. Overall you making excellent progress at this time. Patient is having no significant discomfort. 03/15/18 on evaluation today patient appears to be doing much more poorly in regard to her right lateral ankle ulcer at this point. Unfortunately since have last seen her her husband has passed just a few days ago is obviously weighed heavily on her her daughter also had surgery well she is with her today as usual. There does not appear to be any evidence of infection she does seem to have significant contusion/deep tissue injury to the right lateral malleolus which was not noted previous when I saw her last. It's hard to tell of exactly when this injury occurred although during the time she was spending the night in the hospital this may have been most likely. 03/22/18 on evaluation today patient appears to actually be doing very well in regard to her ulcer. She did unfortunately  have  a setback which was noted last week however the good news is we seem to be getting back on track and in fact the wound in the core did still have some necrotic tissue which will be addressed at this point today but in general I'm seeing signs that things are on the up and up. She is glad to hear this obviously she's been somewhat concerned that due to the how her wound digressed more recently. 03/29/18 on evaluation today patient appears to be doing fairly well in regard to her right lower extremity lateral malleolus ulcer. She unfortunately does have a new area of pressure injury over the inferior portion where the wound has opened up a little bit larger secondary to the pressure she seems to be getting. She does tell me sometimes when she sleeps at night that it actually hurts and does seem to be pushing on the area little bit more unfortunately. There does not appear to be any evidence of infection which is good news. She has been tolerating the dressing changes without complication. She also did have some bruising in the left second and third toes due to the fact that she may have bump this or injured it although she has neuropathy so she does not feel she did move recently that may have been where this came from. Nonetheless there does not appear to be any evidence of infection at this time. 04/12/18 on evaluation today patient's wound on the right lateral ankle actually appears to be doing a little bit better with a lot of necrotic docking tissue centrally loosening up in clearing away. However she does have the beginnings of a deep tissue injury on the left lateral malleolus likely due to the fact we've been trying offload the right as much as we have. I think she may benefit from an assistive soft device to help with offloading and it looks like they're looking at one of the doughnut conditions that wraps around the lower leg to offload which I think will definitely do a good job. With  that being said I think we definitely need to address this issue on the left before it becomes a wound. Patient is not having significant pain. LAKIN, RHINE ([id number]) 04/19/18 on evaluation today patient appears to be doing excellent in regard to the progress she's made with her right lateral ankle ulcer. The left ankle region which did show evidence of a deep tissue injury seems to be resolving there's little fluid noted underneath and a blister there's nothing open at this point in time overall I feel like this is progressing nicely which is good news. She does not seem to be having significant discomfort at this point which is also good news. 04/25/18-She is here in follow up evaluation for bilateral lateral malleolar ulcers. The right lateral malleolus ulcer with pale subcutaneous tissue exposure, central area of ulcer with tendon/periosteum exposed. The left lateral malleolus ulcer now with central area of nonviable tissue, otherwise deep tissue injury. She is wearing compression wraps to the left lower extremity, she will place the right lower extremity compression wraps on when she gets home. She will be out of town over the weekend and return next week and follow-up appointment. She completed her doxycycline this morning 05/03/18 on evaluation today patient appears to be doing very well in regard to her right lateral ankle ulcer in general. At least she's showing some signs of improvement in this regard. Unfortunately she has some additional injury to the left lateral  malleolus region which appears to be new likely even over the past several days. Again this determination is based on the overall appearance. With that being said the patient is obviously frustrated about this currently. 05/10/18-She is here in follow-up evaluation for bilateral lateral malleolar ulcers. She states she has purchased offloading shoes/boots and they will arrive tomorrow. She was asked to bring them in the  office at next week's appointment so her provider is aware of product being utilized. She continues to sleep on right or left side, she has been encouraged to sleep on her back. The right lateral malleolus ulcer is precariously close to peri-osteum; will order xray. The left lateral malleolus ulcer is improved. Will switch back to santyl; she will follow up next week. 05/17/18 on evaluation today patient actually appears to be doing very well in regard to her malleolus her ulcers compared to last time I saw them. She does not seem to have as much in the way of contusion at this point which is great news. With that being said she does continue to have discomfort and I do believe that she is still continuing to benefit from the offloading/pressure reducing boots that were recommended. I think this is the key to trying to get this to heal up completely. Patient History Information obtained from Patient. Family History Cancer - Father,Siblings, Heart Disease - Siblings, No family history of Diabetes, Hereditary Spherocytosis, Hypertension, Kidney Disease, Lung Disease, Seizures, Stroke, Thyroid Problems, Tuberculosis. Social History Never smoker, Marital Status - Married, Alcohol Use - Never, Drug Use - No History, Caffeine Use - Rarely. Review of Systems (ROS) Constitutional Symptoms (General Health) Denies complaints or symptoms of Fever, Chills. Respiratory The patient has no complaints or symptoms. Cardiovascular The patient has no complaints or symptoms. Psychiatric The patient has no complaints or symptoms. Objective Constitutional Well-nourished and well-hydrated in no acute distress. SHYNIECE, SCRIPTER ([id number]) Vitals Time Taken: 12:37 PM, Height: 65 in, Weight: 154.3 lbs, BMI: 25.7, Temperature: 98.4 F, Pulse: 91 bpm, Respiratory Rate: 16 breaths/min, Blood Pressure: 126/65 mmHg. Respiratory normal breathing without difficulty. Psychiatric this patient is able to make  decisions and demonstrates good insight into disease process. Alert and Oriented x 3. pleasant and cooperative. General Notes: On inspection at this point patient's wounds did have some Slough noted on the surface of the wound bed which did require sharp debridement today. She tolerated this without complication and post debridement the wound bed did appear to be doing better although there still slough and still I think she is going to require some additional treatment to see this fully resolved. Integumentary (Hair, Skin) Wound #1 status is Open. Original cause of wound was Gradually Appeared. The wound is located on the Right,Lateral Malleolus. The wound measures 1.9cm length x 2.5cm width x 0.4cm depth; 3.731cm^2 area and 1.492cm^3 volume. There is Fat Layer (Subcutaneous Tissue) Exposed and fascia exposed. There is no tunneling or undermining noted. There is a large amount of serous drainage noted. The wound margin is distinct with the outline attached to the wound base. There is no granulation within the wound bed. There is a large (67-100%) amount of necrotic tissue within the wound bed including Eschar and Adherent Slough. The periwound skin appearance exhibited: Dry/Scaly, Maceration, Rubor, Erythema. The periwound skin appearance did not exhibit: Callus, Crepitus, Excoriation, Induration, Rash, Scarring, Atrophie Blanche, Cyanosis, Ecchymosis, Hemosiderin Staining, Mottled, Pallor. The surrounding wound skin color is noted with erythema which is circumferential. Periwound temperature was noted as No Abnormality. The  periwound has tenderness on palpation. Wound #2 status is Open. Original cause of wound was Gradually Appeared. The wound is located on the Left,Lateral Malleolus. The wound measures 1.5cm length x 1cm width x 0.2cm depth; 1.178cm^2 area and 0.236cm^3 volume. The wound is limited to skin breakdown. There is no tunneling or undermining noted. There is a small amount of serous  drainage noted. The wound margin is flat and intact. There is no granulation within the wound bed. There is a large (67-100%) amount of necrotic tissue within the wound bed including Eschar and Adherent Slough. The periwound skin appearance exhibited: Dry/Scaly, Maceration. The periwound skin appearance did not exhibit: Callus, Crepitus, Excoriation, Induration, Rash, Scarring, Atrophie Blanche, Cyanosis, Ecchymosis, Hemosiderin Staining, Mottled, Pallor, Rubor, Erythema. Periwound temperature was noted as No Abnormality. 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 There was a Excisional Skin/Subcutaneous Tissue Debridement with a total area of 4.75 sq cm performed by STONE III, Sandar Krinke E., PA-C. With the following instrument(s): Curette to remove Viable and Non- Viable tissue/material. Material removed includes Subcutaneous Tissue, Slough, Skin: Dermis, Skin: Epidermis, and Fibrin/Exudate after achieving pain control using Lidocaine 4% Topical Solution. No specimens were taken. A time out was conducted at 13:09, prior to the start of the procedure. A Minimum amount of bleeding was controlled with Pressure. The procedure was tolerated well with a pain level of 0 throughout and a pain level of 0 following the procedure. Patient s Level of Consciousness post procedure was recorded as Awake and Alert. Post Debridement Measurements: 1.9cm length x 2.5cm width x 0.5cm depth; 1.865cm^3 volume. Character of Wound/Ulcer Post Debridement is improved. Severity of Tissue Post Debridement is: Other severity  specified. Post procedure Diagnosis Wound #1: Same as Pre-Procedure General Notes: fascia exposed. Wound #2 Pre-procedure diagnosis of Wound #2 is a Pressure Ulcer located on the Left,Lateral Malleolus . There was a Excisional Skin/Subcutaneous Tissue Debridement with a total area of 1.5 sq cm performed by STONE III, Carvin Almas E., PA-C. With the following instrument(s): Curette to remove Viable and Non-Viable tissue/material. Material removed includes Subcutaneous Tissue, Slough, and Fibrin/Exudate after achieving pain control using Lidocaine 4% Topical Solution. No specimens were taken. A time out was conducted at 13:11, prior to the start of the procedure. A Minimum amount of bleeding was controlled with Pressure. The procedure was tolerated well with a pain level of 0 throughout and a pain level of 0 following the procedure. Patient s Level of Consciousness post procedure was recorded as Awake and Alert. Post Debridement Measurements: 1.5cm length x 1cm width x 0.3cm depth; 0.353cm^3 volume. Post debridement Stage noted as Category/Stage II. Character of Wound/Ulcer Post Debridement is improved. Post procedure Diagnosis Wound #2: Same as Pre-Procedure Plan Wound Cleansing: Wound #1 Right,Lateral Malleolus: Clean wound with Normal Saline. Cleanse wound with mild soap and water May Shower, gently pat wound dry prior to applying new dressing. Wound #2 Left,Lateral Malleolus: Clean wound with Normal Saline. Cleanse wound with mild soap and water May Shower, gently pat wound dry prior to applying new dressing. Anesthetic (add to Medication List): Wound #1 Right,Lateral Malleolus: Topical Lidocaine 4% cream applied to wound bed prior to debridement (In Clinic Only).  Wound #2 Left,Lateral Malleolus: Topical Lidocaine 4% cream applied to wound bed prior to debridement (In Clinic Only). Primary Wound Dressing: Wound #1 Right,Lateral Malleolus: Santyl Ointment Wound #2 Left,Lateral  Malleolus: Santyl Ointment Secondary Dressing: Wound #1 Right,Lateral Malleolus: Boardered Foam Dressing Drawtex Wound #2 Left,Lateral Malleolus: Hovatter, [name] ([id number]) Saline moistened gauze Boardered Foam Dressing Dressing Change Frequency: Wound #1 Right,Lateral Malleolus: Change dressing every day. Wound #2 Left,Lateral Malleolus: Change dressing every day. Follow-up Appointments: Wound #1 Right,Lateral Malleolus: Return Appointment in 1 week. Wound #2 Left,Lateral Malleolus: Return Appointment in 1 week. Edema Control: Wound #1 Right,Lateral Malleolus: Elevate legs to the level of the heart and pump ankles as often as possible Wound #2 Left,Lateral Malleolus: Elevate legs to the level of the heart and pump ankles as often as possible Off-Loading: Wound #1 Right,Lateral Malleolus: Turn and reposition every 2 hours Other: - do not put pressure on your right ankle Wound #2 Left,Lateral Malleolus: Turn and reposition every 2 hours Other: - do not put pressure on your right ankle Additional Orders / Instructions: Wound #1 Right,Lateral Malleolus: Increase protein intake. Other: - Add Vitamin C, Zinc, Vitamin A to your diet Wound #2 Left,Lateral Malleolus: Increase protein intake. Other: - Add Vitamin C, Zinc, Vitamin A to your diet At this point I'm gonna recommend that we continue with the Current wound care measures for the next week. Patient is in agreement with plan. We will see were things stand at follow-up and I did recommend and encourage her to keep using the offloading boots as she has up to this point. Please see above for specific wound care orders. We will see patient for re-evaluation in 1 week(s) here in the clinic. If anything worsens or changes patient will contact our office for additional recommendations. Electronic Signature(s) Signed: 05/19/2018 11:18:14 PM By: Worthy Keeler PA-C Entered By: Worthy Keeler on 05/19/2018 23:17:32 Radde,  [name] ([id number]) -------------------------------------------------------------------------------- ROS/PFSH Details Patient Name: [name] Date of Service: 05/17/2018 12:30 PM Medical Record Number: [id number] Patient Account Number: [id number] Date of Birth/Sex: [birth year] (84 y.o. F) Treating RN: Montey Hora Primary Care Provider: FITZGERALD, DAVID Other Clinician: Referring Provider: FITZGERALD, DAVID Treating Provider/Extender: STONE III, Damaris Abeln Weeks in Treatment: 40 Information Obtained From Patient Wound History Do you currently have one or more open woundso Yes How many open wounds do you currently haveo 1 Approximately how long have you had your woundso 2 yrs How have you been treating your wound(s) until nowo mupirocin, soaking in epsom salt Has your wound(s) ever healed and then re-openedo No Have you had any lab work done in the past montho No Have you tested positive for an antibiotic resistant organism (MRSA, VRE)o No Have you tested positive for osteomyelitis (bone infection)o No Have you had any tests for circulation on your legso Yes Who ordered the testo Dr. Lucky Cowboy [facility] test [facility] Constitutional Symptoms (General Health) Complaints and Symptoms: Negative for: Fever; Chills Eyes Medical History: Positive for: Cataracts - surgery Respiratory Complaints and Symptoms: No Complaints or Symptoms Cardiovascular Complaints and Symptoms: No Complaints or Symptoms Medical History: Positive for: Congestive Heart Failure; Hypertension Musculoskeletal Medical History: Positive for: Osteoarthritis Neurologic Medical History: Positive for: Neuropathy Oncologic KERSTIE, AGENT ([id number]) Medical History: Negative for: Received Chemotherapy; Received Radiation Psychiatric Complaints and Symptoms: No Complaints or Symptoms HBO Extended History Items Eyes: Cataracts Immunizations Pneumococcal Vaccine: Received Pneumococcal  Vaccination: Yes Implantable Devices Family and Social History Cancer: Yes - Father,Siblings; Diabetes: No; Heart Disease:  Yes - Siblings; Hereditary Spherocytosis: No; Hypertension: No; Kidney Disease: No; Lung Disease: No; Seizures: No; Stroke: No; Thyroid Problems: No; Tuberculosis: No; Never smoker; Marital Status - Married; Alcohol Use: Never; Drug Use: No History; Caffeine Use: Rarely; Financial Concerns: No; Food, Clothing or Shelter Needs: No; Support System Lacking: No; Transportation Concerns: No; Advanced Directives: No; Patient does not want information on Advanced Directives; Do not resuscitate: No; Living Will: Yes (Not Provided); Medical Power of Attorney: No Physician Affirmation I have reviewed and agree with the above information. Electronic Signature(s) Signed: 05/19/2018 11:18:14 PM By: Worthy Keeler PA-C Signed: 05/20/2018 5:27:17 PM By: Montey Hora Entered By: Worthy Keeler on 05/19/2018 23:16:54 Dlouhy, [name] ([id number]) -------------------------------------------------------------------------------- SuperBill Details Patient Name: [name] Date of Service: 05/17/2018 Medical Record Number: [id number] Patient Account Number: [id number] Date of Birth/Sex: 10/17/1923 (84 y.o. 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 I89.0 Lymphedema, not elsewhere classified B35.4 Tinea corporis L89.523 Pressure ulcer of left ankle, stage 3 Facility Procedures CPT4 Code: 63016010 Description: 93235 - DEB SUBQ TISSUE 20 SQ CM/< ICD-10 Diagnosis Description 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

## 2018-05-24 ENCOUNTER — Other Ambulatory Visit
Admission: RE | Admit: 2018-05-24 | Discharge: 2018-05-24 | Disposition: A | Discharge status: Home / Self Care | Payer: Medicare Other | Source: Ambulatory Visit | Attending: Physician Assistant | Admitting: Physician Assistant

## 2018-05-24 ENCOUNTER — Encounter: Payer: Medicare Other | Admitting: Physician Assistant

## 2018-05-24 DIAGNOSIS — B999 Unspecified infectious disease: Secondary | ICD-10-CM | POA: Insufficient documentation

## 2018-05-24 DIAGNOSIS — L89523 Pressure ulcer of left ankle, stage 3: Secondary | ICD-10-CM | POA: Diagnosis not present

## 2018-05-27 LAB — AEROBIC CULTURE  (SUPERFICIAL SPECIMEN): GRAM STAIN: NONE SEEN

## 2018-05-27 LAB — AEROBIC CULTURE W GRAM STAIN (SUPERFICIAL SPECIMEN)

## 2018-05-27 NOTE — Progress Notes (Signed)
DARENE, NAPPI ([id number]) Visit Report for 05/24/2018 Chief Complaint Document Details Patient Name: [name], [name]. Date of Service: 05/24/2018 12:30 PM Medical Record Number: [id number] Patient Account Number: [id number] Date of Birth/Sex: 01-17-1923 (84 y.o. F) Treating RN: Montey Hora Primary Care Provider: FITZGERALD, DAVID Other Clinician: Referring Provider: FITZGERALD, DAVID Treating Provider/Extender: STONE III, HOYT Weeks in Treatment: 82 Information Obtained from: Patient Chief Complaint Patient is here for right lateral malleolus and left lateral malleolus ulcer Electronic Signature(s) Signed: 05/24/2018 1:23:24 PM By: Worthy Keeler PA-C Entered By: Worthy Keeler on 05/24/2018 12:45:20 [name], [name] ([id number]) -------------------------------------------------------------------------------- HPI Details Patient Name: [name] Date of Service: 05/24/2018 12:30 PM Medical Record Number: [id number] Patient Account Number: [id number] Date of Birth/Sex: [birth year] (84 y.o. 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 Dr. Amalia Hailey the podiatrist saw her on 07/20/2017 and sent her to the wound center for possible hyperbaric oxygen therapy. past medical history of peripheral vascular disease, varicose veins, status post appendectomy, basal cell carcinoma excision from the left leg, cholecystectomy, pacemaker placement, right lower extremity angiography done by Dr. dew in March 2017 with placement of a stent. there is also note of a successful ablation of the right small  saphenous vein done which was reviewed by ultrasound on 10/24/2016. the patient had a right small saphenous vein ablation done on 10/20/2016. The patient has never been a smoker. She has been seen by Dr. Corene Cornea dew the vascular surgeon who most recently saw her on 06/15/2017 for evaluation of ongoing problems with right leg swelling. She had a lower extremity arterial duplex examination done(02/13/17) which showed patent distal right superficial femoral artery stent and above-the-knee popliteal stent without evidence of restenosis. The ABI was more than 1.3 on the right and more than 1.3 on the left. This was consistent with noncompressible arteries due to medial calcification. The right great toe pressure and PPG waveforms are within normal limits and the left great toe pressure and PPG waveforms are decreased. he recommended she continue to wear her compression stockings and continue with elevation. She is scheduled to have a noninvasive arterial study in the near future 08/16/2017 -- had a lower extremity arterial duplex examination done which showed patent distal right superficial femoral artery stent and above-the-knee popliteal stent without evidence of restenosis. The ABI was more than 1.3 on the right and more than 1.3 on the left. This was consistent with noncompressible arteries due to medial calcification. The right great toe pressure and PPG waveforms are within normal limits and the left great toe pressure and PPG waveforms are decreased. the x-ray of the right ankle has not yet been done 08/24/2017 -- had a right ankle x-ray -- IMPRESSION:1. No fracture, bone lesion or evidence of osteomyelitis. 2. Lateral soft tissue swelling with a soft tissue ulcer. she has not yet seen the vascular surgeon for review 08/31/17 on evaluation today patient's wound appears to be showing signs of improvement. She still with her appointment with vascular in order to review her results of her vascular  study and then determine if any intervention would be recommended at that time. No fevers, chills, nausea, or vomiting noted at this time. She has  been tolerating the dressing changes without complication. 09/28/17 on evaluation today patient's wound appears to show signs of good improvement in regard to the granulation tissue which is surfacing. There is still a layer of slough covering the wound and the posterior portion is still significantly deeper than the anterior nonetheless there has been some good sign of things moving towards the better. She is going to go back to Dr. dew for reevaluation to ensure her blood flow is still appropriate. That will be before her next evaluation with [place] next week. No fevers, chills, nausea, or vomiting noted at this time. Patient does have some discomfort rated to be a 3-4/10 depending on activity specifically cleansing the wound makes it worse. 10/05/2017 -- the patient was seen by Dr. Lucky Cowboy last week and noninvasive studies showed a normal right ABI with brisk triphasic waveforms consistent with no arterial insufficiency including normal digital pressures. The duplex showed a patent distal right SFA stent and the proximal SFA was also normal. He was pleased with her test and thought she should have enough of perfusion for normal wound healing. He would see her back in 6 months time. 12/21/17 on evaluation today patient appears to be doing fairly well in regard to her right lateral ankle wound. Unfortunately the main issue that she is expansion at this point is that she is having some issues with what appears to be some cellulitis in the [name], [name]. ([id number]) right anterior shin. She has also been noting a little bit of uncomfortable feeling especially last night and her ankle area. I'm afraid that she made the developing a little bit of an infection. With that being said I think it is in the early stages. 12/28/17 on evaluation today patient's ankle  appears to be doing excellent. She's making good progress at this point the cellulitis seems to have improved after last week's evaluation. Overall she is having no significant discomfort which is excellent news. She does have an appointment with Dr. dew on March 29, 2018 for reevaluation in regard to the stent he placed. She seems to have excellent blood flow in the right lower extremity. 01/19/12 on evaluation today patient's wound appears to be doing very well. In fact she does not appear to require debridement at this point, there's no evidence of infection, and overall from the standpoint of the wound she seems to be doing very well. With that being said I believe that it may be time to switch to different dressing away from the [facility] Dressing she tells me she does have a lot going on her friend actually passed away yesterday and she's also having a lot of issues with her husband this obviously is weighing heavy on her as far as your thoughts and concerns today. 01/25/18 on evaluation today patient appears to be doing fairly well in regard to her right lateral malleolus. She has been tolerating the dressing changes without complication. Overall I feel like this is definitely showing signs of improvement as far as how the overall appearance of the wound is there's also evidence of epithelium start to migrate over the granulation tissue. In general I think that she is progressing nicely as far as the wound is concerned. The only concern she really has is whether or not we can switch to every other week visits in order to avoid having as many appointments as her daughters have a difficult time getting her to her appointments as well as the patient's husband to his he is  not doing very well at this point. 02/22/18 on evaluation today patient's right lateral malleolus ulcer appears to be doing great. She has been tolerating the dressing changes without complication. Overall you making excellent  progress at this time. Patient is having no significant discomfort. 03/15/18 on evaluation today patient appears to be doing much more poorly in regard to her right lateral ankle ulcer at this point. Unfortunately since have last seen her her husband has passed just a few days ago is obviously weighed heavily on her her daughter also had surgery well she is with her today as usual. There does not appear to be any evidence of infection she does seem to have significant contusion/deep tissue injury to the right lateral malleolus which was not noted previous when I saw her last. It's hard to tell of exactly when this injury occurred although during the time she was spending the night in the hospital this may have been most likely. 03/22/18 on evaluation today patient appears to actually be doing very well in regard to her ulcer. She did unfortunately have a setback which was noted last week however the good news is we seem to be getting back on track and in fact the wound in the core did still have some necrotic tissue which will be addressed at this point today but in general I'm seeing signs that things are on the up and up. She is glad to hear this obviously she's been somewhat concerned that due to the how her wound digressed more recently. 03/29/18 on evaluation today patient appears to be doing fairly well in regard to her right lower extremity lateral malleolus ulcer. She unfortunately does have a new area of pressure injury over the inferior portion where the wound has opened up a little bit larger secondary to the pressure she seems to be getting. She does tell me sometimes when she sleeps at night that it actually hurts and does seem to be pushing on the area little bit more unfortunately. There does not appear to be any evidence of infection which is good news. She has been tolerating the dressing changes without complication. She also did have some bruising in the left second and third toes  due to the fact that she may have bump this or injured it although she has neuropathy so she does not feel she did move recently that may have been where this came from. Nonetheless there does not appear to be any evidence of infection at this time. 04/12/18 on evaluation today patient's wound on the right lateral ankle actually appears to be doing a little bit better with a lot of necrotic docking tissue centrally loosening up in clearing away. However she does have the beginnings of a deep tissue injury on the left lateral malleolus likely due to the fact we've been trying offload the right as much as we have. I think she may benefit from an assistive soft device to help with offloading and it looks like they're looking at one of the doughnut conditions that wraps around the lower leg to offload which I think will definitely do a good job. With that being said I think we definitely need to address this issue on the left before it becomes a wound. Patient is not having significant pain. 04/19/18 on evaluation today patient appears to be doing excellent in regard to the progress she's made with her right lateral ankle ulcer. The left ankle region which did show evidence of a deep tissue injury seems  to be resolving there's little fluid noted underneath and a blister there's nothing open at this point in time overall I feel like this is progressing nicely which is good news. She does not seem to be having significant discomfort at this point which is also good news. 04/25/18-She is here in follow up evaluation for bilateral lateral malleolar ulcers. The right lateral malleolus ulcer with pale subcutaneous tissue exposure, central area of ulcer with tendon/periosteum exposed. The left lateral malleolus ulcer now with Mclees, [name] ([id number]) central area of nonviable tissue, otherwise deep tissue injury. She is wearing compression wraps to the left lower extremity, she will place the right lower  extremity compression wraps on when she gets home. She will be out of town over the weekend and return next week and follow-up appointment. She completed her doxycycline this morning 05/03/18 on evaluation today patient appears to be doing very well in regard to her right lateral ankle ulcer in general. At least she's showing some signs of improvement in this regard. Unfortunately she has some additional injury to the left lateral malleolus region which appears to be new likely even over the past several days. Again this determination is based on the overall appearance. With that being said the patient is obviously frustrated about this currently. 05/10/18-She is here in follow-up evaluation for bilateral lateral malleolar ulcers. She states she has purchased offloading shoes/boots and they will arrive tomorrow. She was asked to bring them in the office at next week's appointment so her provider is aware of product being utilized. She continues to sleep on right or left side, she has been encouraged to sleep on her back. The right lateral malleolus ulcer is precariously close to peri-osteum; will order xray. The left lateral malleolus ulcer is improved. Will switch back to santyl; she will follow up next week. 05/17/18 on evaluation today patient actually appears to be doing very well in regard to her malleolus her ulcers compared to last time I saw them. She does not seem to have as much in the way of contusion at this point which is great news. With that being said she does continue to have discomfort and I do believe that she is still continuing to benefit from the offloading/pressure reducing boots that were recommended. I think this is the key to trying to get this to heal up completely. 05/24/18 on evaluation today patient actually appears to be doing worse at this point in time unfortunately compared to her last week's evaluation. She is having really no increased pain which is good news  unfortunately she does have more maceration in your theme and noted surrounding the right lateral ankle the left lateral ankle is not really is erythematous I do not see signs of the overt cellulitis on that side. Unfortunately the wounds do not seem to have shown any signs of improvement since the last evaluation. She also has significant swelling especially on the right compared to previous some of this may be due to infection however also think that she may be served better while she has these wounds by compression wrapping versus continuing to use the Juxta-Lite for the time being. Especially with the amount of drainage that she is experiencing at this point. No fevers, chills, nausea, or vomiting noted at this time. Electronic Signature(s) Signed: 05/24/2018 1:23:24 PM By: Worthy Keeler PA-C Entered By: Worthy Keeler on 05/24/2018 13:17:57 [name], [name] ([id number]) -------------------------------------------------------------------------------- Physical Exam Details Patient Name: [name] Date of Service: 05/24/2018  12:30 PM Medical Record Number: [id number] Patient Account Number: [id number] Date of Birth/Sex: 09/07/1923 (84 y.o. F) Treating RN: Montey Hora Primary Care Provider: FITZGERALD, DAVID Other Clinician: Referring Provider: FITZGERALD, DAVID Treating Provider/Extender: STONE III, HOYT Weeks in Treatment: 77 Constitutional Well-nourished and well-hydrated in no acute distress. Respiratory normal breathing without difficulty. clear to auscultation bilaterally. Cardiovascular regular rate and rhythm with normal S1, S2. 2+ pitting edema of the bilateral lower extremities. Psychiatric this patient is able to make decisions and demonstrates good insight into disease process. Alert and Oriented x 3. pleasant and cooperative. Notes On inspection today patient has significant maceration of the right lateral ankle there is Slough noted in the base of the  wound on both sides compared to previous but fortunately there does not appear to be any new pressure injury which is at least good news. She does have significant swelling on the right even when compared to the left. Patient is obscene very frustrated with her situation which I can completely understand at this time. Electronic Signature(s) Signed: 05/24/2018 1:23:24 PM By: Worthy Keeler PA-C Entered By: Worthy Keeler on 05/24/2018 13:18:48 [name], [name] ([id number]) -------------------------------------------------------------------------------- Physician Orders Details Patient Name: [name] Date of Service: 05/24/2018 12:30 PM Medical Record Number: [id number] Patient Account Number: [id number] Date of Birth/Sex: July 24, 1923 (84 y.o. F) Treating RN: Montey Hora Primary Care Provider: FITZGERALD, DAVID Other Clinician: Referring Provider: FITZGERALD, DAVID Treating Provider/Extender: STONE III, HOYT Weeks in Treatment: 24 Verbal / Phone Orders: No Diagnosis Coding ICD-10 Coding Code Description 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, stage 4 I70.233 Atherosclerosis of native arteries of right leg with ulceration of ankle I89.0 Lymphedema, not elsewhere classified B35.4 Tinea corporis L89.523 Pressure ulcer of left ankle, stage 3 Wound Cleansing Wound #1 Right,Lateral Malleolus o Clean wound with Normal Saline. o Cleanse wound with mild soap and water o May Shower, gently pat wound dry prior to applying new dressing. Wound #2 Left,Lateral Malleolus o Clean wound with Normal Saline. o Cleanse wound with mild soap and water o May Shower, gently pat wound dry prior to applying new dressing. Anesthetic (add to Medication List) Wound #1 Right,Lateral Malleolus o Topical Lidocaine 4% cream applied to wound bed prior to debridement (In Clinic Only). Wound #2 Left,Lateral Malleolus o Topical Lidocaine 4% cream applied to wound bed prior to  debridement (In Clinic Only). Primary Wound Dressing Wound #1 Right,Lateral Malleolus o Silver Alginate Wound #2 Left,Lateral Malleolus o Silver Alginate Secondary Dressing Wound #1 Right,Lateral Malleolus o ABD pad o XtraSorb Wound #2 Left,Lateral Malleolus o ABD pad o GRAE, LEATHERS ([id number]) Dressing Change Frequency Wound #1 Right,Lateral Malleolus o Change Dressing Monday, Wednesday, Friday Wound #2 Left,Lateral Malleolus o Change Dressing Monday, Wednesday, Friday Follow-up Appointments Wound #1 Right,Lateral Malleolus o Return Appointment in 1 week. Wound #2 Left,Lateral Malleolus o Return Appointment in 1 week. Edema Control Wound #1 Right,Lateral Malleolus o 3 Layer Compression System - Bilateral o Elevate legs to the level of the heart and pump ankles as often as possible Wound #2 Left,Lateral Malleolus o 3 Layer Compression System - Bilateral o Elevate legs to the level of the heart and pump ankles as often as possible Off-Loading Wound #1 Right,Lateral Malleolus o Turn and reposition every 2 hours o Other: - do not put pressure on your right ankle Wound #2 Left,Lateral Malleolus o Turn and reposition every 2 hours o Other: - do not put pressure on your right ankle Additional Orders / Instructions Wound #1 Right,Lateral  Malleolus o Increase protein intake. o Other: - Add Vitamin C, Zinc, Vitamin A to your diet Wound #2 Left,Lateral Malleolus o Increase protein intake. o Other: - Add Vitamin C, Zinc, Vitamin A to your diet Home Health Wound #1 [place] for [place] Nurse may visit PRN to address patientos wound care needs. o FACE TO FACE ENCOUNTER: MEDICARE and MEDICAID PATIENTS: I certify that this patient is under my care and that I had a face-to-face encounter that meets the physician face-to-face encounter requirements with  this patient on this date. The encounter with the patient was in whole or in part for the following MEDICAL CONDITION: (primary reason for [place]) MEDICAL NECESSITY: I certify, that based on my findings, NURSING services are a medically necessary home health service. HOME BOUND STATUS: I certify that my clinical findings support that this patient is homebound (i.e., Due to illness or injury, pt requires aid of supportive devices such as crutches, cane, wheelchairs, walkers, the use of special transportation or the assistance of another person to leave their place of residence. There is a normal inability to leave the home and doing so requires considerable and taxing effort. Other absences are for medical reasons / religious services and are infrequent or of short duration when for other reasons). [name], ALBIN ([id number]) o If current dressing causes regression in wound condition, may D/C ordered dressing product/s and apply Normal Saline Moist Dressing daily until next [place] / Other MD appointment. [place] of regression in wound condition at [phone]. o Please direct any NON-WOUND related issues/requests for orders to patient's Primary Care Physician Wound #2 [place] for [place] Nurse may visit PRN to address patientos wound care needs. o FACE TO FACE ENCOUNTER: MEDICARE and MEDICAID PATIENTS: I certify that this patient is under my care and that I had a face-to-face encounter that meets the physician face-to-face encounter requirements with this patient on this date. The encounter with the patient was in whole or in part for the following MEDICAL CONDITION: (primary reason for [place]) MEDICAL NECESSITY: I certify, that based on my findings, NURSING services are a medically necessary home health service. HOME BOUND STATUS: I certify that my clinical findings  support that this patient is homebound (i.e., Due to illness or injury, pt requires aid of supportive devices such as crutches, cane, wheelchairs, walkers, the use of special transportation or the assistance of another person to leave their place of residence. There is a normal inability to leave the home and doing so requires considerable and taxing effort. Other absences are for medical reasons / religious services and are infrequent or of short duration when for other reasons). o If current dressing causes regression in wound condition, may D/C ordered dressing product/s and apply Normal Saline Moist Dressing daily until next [place] / Other MD appointment. [place] of regression in wound condition at [phone]. o Please direct any NON-WOUND related issues/requests for orders to patient's Primary Care Physician Laboratory o Bacteria identified in Wound by Culture (MICRO) oooo LOINC Code: 9735-3 oooo Convenience Name: Wound culture routine Patient Medications Allergies: sulfa, metronidazole, monistat Notifications Medication Indication Start End Cipro 05/24/2018 DOSE 1 - oral 500 mg tablet - 1 tablet oral taken 2 times a day for 10 days Electronic Signature(s) Signed: 05/24/2018 4:49:47 PM By: Montey Hora Signed: 05/27/2018 12:55:36 AM By: Worthy Keeler PA-C  Previous Signature: 05/24/2018 1:21:59 PM Version By: Worthy Keeler PA-C Entered By: Montey Hora on 05/24/2018 13:26:36 [name], [name] ([id number]) -------------------------------------------------------------------------------- Problem List Details Patient Name: [name] Date of Service: 05/24/2018 12:30 PM Medical Record Number: [id number] Patient Account Number: [id number] Date of Birth/Sex: [birth year] (84 y.o. F) Treating RN: Montey Hora Primary Care Provider: FITZGERALD, DAVID Other Clinician: Referring Provider: FITZGERALD, DAVID Treating Provider/Extender:  STONE III, HOYT Weeks in Treatment: 30 Active Problems ICD-10 Evaluated Encounter Code Description Active Date Today Diagnosis 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, stage 4 05/10/2018 No Yes I70.233 Atherosclerosis of native arteries of right leg with ulceration of 08/10/2017 No Yes ankle I89.0 Lymphedema, not elsewhere classified 08/10/2017 No Yes B35.4 Tinea corporis 09/28/2017 No Yes L89.523 Pressure ulcer of left ankle, stage 3 04/25/2018 No Yes Inactive Problems Resolved Problems Electronic Signature(s) Signed: 05/24/2018 1:23:24 PM By: Worthy Keeler PA-C Entered By: Worthy Keeler on 05/24/2018 12:45:14 [name], [name] ([id number]) -------------------------------------------------------------------------------- Progress Note Details Patient Name: [name] Date of Service: 05/24/2018 12:30 PM Medical Record Number: [id number] Patient Account Number: [id number] Date of Birth/Sex: 1923/01/05 (84 y.o. F) Treating RN: Montey Hora Primary Care Provider: FITZGERALD, DAVID Other Clinician: Referring Provider: FITZGERALD, DAVID Treating Provider/Extender: STONE III, HOYT Weeks in Treatment: 41 Subjective Chief Complaint Information obtained from Patient Patient is here for right lateral malleolus and left lateral malleolus ulcer History of Present Illness (HPI) 84 year old patient who most recently has been seeing both podiatry and vascular surgery for a long-standing ulcer of her right lateral malleolus which has been treated with various methodologies. Dr. Amalia Hailey the podiatrist saw her on 07/20/2017 and sent her to the wound center for possible hyperbaric oxygen therapy. past medical history of peripheral vascular disease, varicose veins, status post appendectomy, basal cell carcinoma excision from the left leg, cholecystectomy, pacemaker placement, right lower extremity angiography done by Dr. dew in March 2017 with placement of a stent. there is also note of a  successful ablation of the right small saphenous vein done which was reviewed by ultrasound on 10/24/2016. the patient had a right small saphenous vein ablation done on 10/20/2016. The patient has never been a smoker. She has been seen by Dr. Corene Cornea dew the vascular surgeon who most recently saw her on 06/15/2017 for evaluation of ongoing problems with right leg swelling. She had a lower extremity arterial duplex examination done(02/13/17) which showed patent distal right superficial femoral artery stent and above-the-knee popliteal stent without evidence of restenosis. The ABI was more than 1.3 on the right and more than 1.3 on the left. This was consistent with noncompressible arteries due to medial calcification. The right great toe pressure and PPG waveforms are within normal limits and the left great toe pressure and PPG waveforms are decreased. he recommended she continue to wear her compression stockings and continue with elevation. She is scheduled to have a noninvasive arterial study in the near future 08/16/2017 -- had a lower extremity arterial duplex examination done which showed patent distal right superficial femoral artery stent and above-the-knee popliteal stent without evidence of restenosis. The ABI was more than 1.3 on the right and more than 1.3 on the left. This was consistent with noncompressible arteries due to medial calcification. The right great toe pressure and PPG waveforms are within normal limits and the left great toe pressure and PPG waveforms are decreased. the x-ray of the right ankle has not yet been done 08/24/2017 -- had a right ankle x-ray -- IMPRESSION:1. No fracture,  bone lesion or evidence of osteomyelitis. 2. Lateral soft tissue swelling with a soft tissue ulcer. she has not yet seen the vascular surgeon for review 08/31/17 on evaluation today patient's wound appears to be showing signs of improvement. She still with her appointment with vascular in order to  review her results of her vascular study and then determine if any intervention would be recommended at that time. No fevers, chills, nausea, or vomiting noted at this time. She has been tolerating the dressing changes without complication. 09/28/17 on evaluation today patient's wound appears to show signs of good improvement in regard to the granulation tissue which is surfacing. There is still a layer of slough covering the wound and the posterior portion is still significantly deeper than the anterior nonetheless there has been some good sign of things moving towards the better. She is going to go back to Dr. dew for reevaluation to ensure her blood flow is still appropriate. That will be before her next evaluation with [place] next week. No fevers, chills, nausea, or vomiting noted at this time. Patient does have some discomfort rated to be a 3-4/10 depending on activity specifically cleansing the wound makes it worse. 10/05/2017 -- the patient was seen by Dr. Lucky Cowboy last week and noninvasive studies showed a normal right ABI with brisk [name], [name]. ([id number]) triphasic waveforms consistent with no arterial insufficiency including normal digital pressures. The duplex showed a patent distal right SFA stent and the proximal SFA was also normal. He was pleased with her test and thought she should have enough of perfusion for normal wound healing. He would see her back in 6 months time. 12/21/17 on evaluation today patient appears to be doing fairly well in regard to her right lateral ankle wound. Unfortunately the main issue that she is expansion at this point is that she is having some issues with what appears to be some cellulitis in the right anterior shin. She has also been noting a little bit of uncomfortable feeling especially last night and her ankle area. I'm afraid that she made the developing a little bit of an infection. With that being said I think it is in the early stages. 12/28/17 on  evaluation today patient's ankle appears to be doing excellent. She's making good progress at this point the cellulitis seems to have improved after last week's evaluation. Overall she is having no significant discomfort which is excellent news. She does have an appointment with Dr. dew on March 29, 2018 for reevaluation in regard to the stent he placed. She seems to have excellent blood flow in the right lower extremity. 01/19/12 on evaluation today patient's wound appears to be doing very well. In fact she does not appear to require debridement at this point, there's no evidence of infection, and overall from the standpoint of the wound she seems to be doing very well. With that being said I believe that it may be time to switch to different dressing away from the [facility] Dressing she tells me she does have a lot going on her friend actually passed away yesterday and she's also having a lot of issues with her husband this obviously is weighing heavy on her as far as your thoughts and concerns today. 01/25/18 on evaluation today patient appears to be doing fairly well in regard to her right lateral malleolus. She has been tolerating the dressing changes without complication. Overall I feel like this is definitely showing signs of improvement as far as how the overall  appearance of the wound is there's also evidence of epithelium start to migrate over the granulation tissue. In general I think that she is progressing nicely as far as the wound is concerned. The only concern she really has is whether or not we can switch to every other week visits in order to avoid having as many appointments as her daughters have a difficult time getting her to her appointments as well as the patient's husband to his he is not doing very well at this point. 02/22/18 on evaluation today patient's right lateral malleolus ulcer appears to be doing great. She has been tolerating the dressing changes without  complication. Overall you making excellent progress at this time. Patient is having no significant discomfort. 03/15/18 on evaluation today patient appears to be doing much more poorly in regard to her right lateral ankle ulcer at this point. Unfortunately since have last seen her her husband has passed just a few days ago is obviously weighed heavily on her her daughter also had surgery well she is with her today as usual. There does not appear to be any evidence of infection she does seem to have significant contusion/deep tissue injury to the right lateral malleolus which was not noted previous when I saw her last. It's hard to tell of exactly when this injury occurred although during the time she was spending the night in the hospital this may have been most likely. 03/22/18 on evaluation today patient appears to actually be doing very well in regard to her ulcer. She did unfortunately have a setback which was noted last week however the good news is we seem to be getting back on track and in fact the wound in the core did still have some necrotic tissue which will be addressed at this point today but in general I'm seeing signs that things are on the up and up. She is glad to hear this obviously she's been somewhat concerned that due to the how her wound digressed more recently. 03/29/18 on evaluation today patient appears to be doing fairly well in regard to her right lower extremity lateral malleolus ulcer. She unfortunately does have a new area of pressure injury over the inferior portion where the wound has opened up a little bit larger secondary to the pressure she seems to be getting. She does tell me sometimes when she sleeps at night that it actually hurts and does seem to be pushing on the area little bit more unfortunately. There does not appear to be any evidence of infection which is good news. She has been tolerating the dressing changes without complication. She also did have some  bruising in the left second and third toes due to the fact that she may have bump this or injured it although she has neuropathy so she does not feel she did move recently that may have been where this came from. Nonetheless there does not appear to be any evidence of infection at this time. 04/12/18 on evaluation today patient's wound on the right lateral ankle actually appears to be doing a little bit better with a lot of necrotic docking tissue centrally loosening up in clearing away. However she does have the beginnings of a deep tissue injury on the left lateral malleolus likely due to the fact we've been trying offload the right as much as we have. I think she may benefit from an assistive soft device to help with offloading and it looks like they're looking at one of the doughnut conditions  that wraps around the lower leg to offload which I think will definitely do a good job. With that being said I think we definitely need to address this issue on the left before it becomes a wound. Patient is not having significant pain. [name], [name] ([id number]) 04/19/18 on evaluation today patient appears to be doing excellent in regard to the progress she's made with her right lateral ankle ulcer. The left ankle region which did show evidence of a deep tissue injury seems to be resolving there's little fluid noted underneath and a blister there's nothing open at this point in time overall I feel like this is progressing nicely which is good news. She does not seem to be having significant discomfort at this point which is also good news. 04/25/18-She is here in follow up evaluation for bilateral lateral malleolar ulcers. The right lateral malleolus ulcer with pale subcutaneous tissue exposure, central area of ulcer with tendon/periosteum exposed. The left lateral malleolus ulcer now with central area of nonviable tissue, otherwise deep tissue injury. She is wearing compression wraps to the left lower  extremity, she will place the right lower extremity compression wraps on when she gets home. She will be out of town over the weekend and return next week and follow-up appointment. She completed her doxycycline this morning 05/03/18 on evaluation today patient appears to be doing very well in regard to her right lateral ankle ulcer in general. At least she's showing some signs of improvement in this regard. Unfortunately she has some additional injury to the left lateral malleolus region which appears to be new likely even over the past several days. Again this determination is based on the overall appearance. With that being said the patient is obviously frustrated about this currently. 05/10/18-She is here in follow-up evaluation for bilateral lateral malleolar ulcers. She states she has purchased offloading shoes/boots and they will arrive tomorrow. She was asked to bring them in the office at next week's appointment so her provider is aware of product being utilized. She continues to sleep on right or left side, she has been encouraged to sleep on her back. The right lateral malleolus ulcer is precariously close to peri-osteum; will order xray. The left lateral malleolus ulcer is improved. Will switch back to santyl; she will follow up next week. 05/17/18 on evaluation today patient actually appears to be doing very well in regard to her malleolus her ulcers compared to last time I saw them. She does not seem to have as much in the way of contusion at this point which is great news. With that being said she does continue to have discomfort and I do believe that she is still continuing to benefit from the offloading/pressure reducing boots that were recommended. I think this is the key to trying to get this to heal up completely. 05/24/18 on evaluation today patient actually appears to be doing worse at this point in time unfortunately compared to her last week's evaluation. She is having really no  increased pain which is good news unfortunately she does have more maceration in your theme and noted surrounding the right lateral ankle the left lateral ankle is not really is erythematous I do not see signs of the overt cellulitis on that side. Unfortunately the wounds do not seem to have shown any signs of improvement since the last evaluation. She also has significant swelling especially on the right compared to previous some of this may be due to infection however also think that she  may be served better while she has these wounds by compression wrapping versus continuing to use the Juxta-Lite for the time being. Especially with the amount of drainage that she is experiencing at this point. No fevers, chills, nausea, or vomiting noted at this time. Patient History Information obtained from Patient. Family History Cancer - Father,Siblings, Heart Disease - Siblings, No family history of Diabetes, Hereditary Spherocytosis, Hypertension, Kidney Disease, Lung Disease, Seizures, Stroke, Thyroid Problems, Tuberculosis. Social History Never smoker, Marital Status - Married, Alcohol Use - Never, Drug Use - No History, Caffeine Use - Rarely. Review of Systems (ROS) Constitutional Symptoms (General Health) Denies complaints or symptoms of Fever, Chills. Respiratory The patient has no complaints or symptoms. Cardiovascular Complains or has symptoms of LE edema. Psychiatric The patient has no complaints or symptoms. [name], [name] ([id number]) Objective Constitutional Well-nourished and well-hydrated in no acute distress. Vitals Time Taken: 12:30 PM, Height: 65 in, Weight: 154.3 lbs, BMI: 25.7, Temperature: 98.3 F, Pulse: 90 bpm, Respiratory Rate: 16 breaths/min, Blood Pressure: 141/66 mmHg. Respiratory normal breathing without difficulty. clear to auscultation bilaterally. Cardiovascular regular rate and rhythm with normal S1, S2. 2+ pitting edema of the bilateral lower  extremities. Psychiatric this patient is able to make decisions and demonstrates good insight into disease process. Alert and Oriented x 3. pleasant and cooperative. General Notes: On inspection today patient has significant maceration of the right lateral ankle there is Slough noted in the base of the wound on both sides compared to previous but fortunately there does not appear to be any new pressure injury which is at least good news. She does have significant swelling on the right even when compared to the left. Patient is obscene very frustrated with her situation which I can completely understand at this time. Integumentary (Hair, Skin) Wound #1 status is Open. Original cause of wound was Gradually Appeared. The wound is located on the Right,Lateral Malleolus. The wound measures 2cm length x 1.8cm width x 0.4cm depth; 2.827cm^2 area and 1.131cm^3 volume. There is Fat Layer (Subcutaneous Tissue) Exposed and fascia exposed. There is no tunneling noted. There is a large amount of purulent drainage noted. Foul odor after cleansing was noted. The wound margin is distinct with the outline attached to the wound base. There is no granulation within the wound bed. There is a large (67-100%) amount of necrotic tissue within the wound bed including Eschar and Adherent Slough. The periwound skin appearance exhibited: Dry/Scaly, Maceration, Rubor, Erythema. The periwound skin appearance did not exhibit: Callus, Crepitus, Excoriation, Induration, Rash, Scarring, Atrophie Blanche, Cyanosis, Ecchymosis, Hemosiderin Staining, Mottled, Pallor. The surrounding wound skin color is noted with erythema which is circumferential. Periwound temperature was noted as No Abnormality. The periwound has tenderness on palpation. Wound #2 status is Open. Original cause of wound was Gradually Appeared. The wound is located on the Left,Lateral Malleolus. The wound measures 1.5cm length x 1.3cm width x 0.2cm depth; 1.532cm^2  area and 0.306cm^3 volume. The wound is limited to skin breakdown. There is no tunneling or undermining noted. There is a medium amount of purulent drainage noted. The wound margin is flat and intact. There is no granulation within the wound bed. There is a large (67-100%) amount of necrotic tissue within the wound bed including Adherent Slough. The periwound skin appearance exhibited: Dry/Scaly, Maceration. The periwound skin appearance did not exhibit: Callus, Crepitus, Excoriation, Induration, Rash, Scarring, Atrophie Blanche, Cyanosis, Ecchymosis, Hemosiderin Staining, Mottled, Pallor, Rubor, Erythema. Periwound temperature was noted as No Abnormality. 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 There was a Three Layer Compression Therapy Procedure with a pre-treatment ABI of 1 by Montey Hora, RN. Post procedure Diagnosis Wound #1: Same as Pre-Procedure Wound #2 Pre-procedure diagnosis of Wound #2 is a Pressure Ulcer located on the Left,Lateral Malleolus . There was a Three Layer Compression Therapy Procedure with a pre-treatment ABI of 1 by Montey Hora, RN. Post procedure Diagnosis Wound #2: Same as Pre-Procedure Plan Wound Cleansing: Wound #1 Right,Lateral Malleolus: Clean wound with Normal Saline. Cleanse wound with mild soap and water May Shower, gently pat wound dry prior to applying new dressing. Wound #2 Left,Lateral Malleolus: Clean wound with Normal Saline. Cleanse wound with mild soap and water May Shower, gently pat wound dry prior to applying new dressing. Anesthetic (add to Medication List): Wound #1 Right,Lateral  Malleolus: Topical Lidocaine 4% cream applied to wound bed prior to debridement (In Clinic Only). Wound #2 Left,Lateral Malleolus: Topical Lidocaine 4% cream applied to wound bed prior to debridement (In Clinic Only). Primary Wound Dressing: Wound #1 Right,Lateral Malleolus: Silver Alginate Wound #2 Left,Lateral Malleolus: Silver Alginate Secondary Dressing: Wound #1 Right,Lateral Malleolus: ABD pad XtraSorb Wound #2 Left,Lateral Malleolus: ABD pad XtraSorb Dressing Change Frequency: Wound #1 Right,Lateral Malleolus: [name], [name] ([id number]) Change Dressing Monday, Wednesday, Friday Wound #2 Left,Lateral Malleolus: Change Dressing Monday, Wednesday, Friday Follow-up Appointments: Wound #1 Right,Lateral Malleolus: Return Appointment in 1 week. Wound #2 Left,Lateral Malleolus: Return Appointment in 1 week. Edema Control: Wound #1 Right,Lateral Malleolus: 3 Layer Compression System - Bilateral Elevate legs to the level of the heart and pump ankles as often as possible Wound #2 Left,Lateral Malleolus: 3 Layer Compression System - Bilateral Elevate legs to the level of the heart and pump ankles as often as possible Off-Loading: Wound #1 Right,Lateral Malleolus: Turn and reposition every 2 hours Other: - do not put pressure on your right ankle Wound #2 Left,Lateral Malleolus: Turn and reposition every 2 hours Other: - do not put pressure on your right ankle Additional Orders / Instructions: Wound #1 Right,Lateral Malleolus: Increase protein intake. Other: - Add Vitamin C, Zinc, Vitamin A to your diet Wound #2 Left,Lateral Malleolus: Increase protein intake. Other: - Add Vitamin C, Zinc, Vitamin A to your diet Home Health: Wound #1 Right,Lateral Malleolus: [name] [facility] [place] Nurse may visit PRN to address patient s wound care needs. FACE TO FACE ENCOUNTER: MEDICARE and MEDICAID PATIENTS: I certify that this patient is under my care  and that I had a face-to-face encounter that meets the physician face-to-face encounter requirements with this patient on this date. The encounter with the patient was in whole or in part for the following MEDICAL CONDITION: (primary reason for [place]) MEDICAL NECESSITY: I certify, that based on my findings, NURSING services are a medically necessary home health service. HOME BOUND STATUS: I certify that my clinical findings support that this patient is homebound (i.e., Due to illness or injury, pt requires aid of supportive devices such as crutches, cane, wheelchairs, walkers, the use of special transportation or the assistance of another person to leave their place of residence. There is a normal inability to leave the home and doing so requires considerable and taxing effort. Other absences  are for medical reasons / religious services and are infrequent or of short duration when for other reasons). If current dressing causes regression in wound condition, may D/C ordered dressing product/s and apply Normal Saline Moist Dressing daily until next [place] / Other MD appointment. [place] of regression in wound condition at [phone]. Please direct any NON-WOUND related issues/requests for orders to patient's Primary Care Physician Wound #2 Left,Lateral Malleolus: [place] for [place] Nurse may visit PRN to address patient s wound care needs. FACE TO FACE ENCOUNTER: MEDICARE and MEDICAID PATIENTS: I certify that this patient is under my care and that I had a face-to-face encounter that meets the physician face-to-face encounter requirements with this patient on this date. The encounter with the patient was in whole or in part for the following MEDICAL CONDITION: (primary reason for [place]) MEDICAL NECESSITY: I certify, that based on my findings, NURSING services are a medically necessary home health service. HOME  BOUND STATUS: I certify that my clinical findings support that this patient is homebound (i.e., Due to illness or injury, pt requires aid of supportive devices such as crutches, cane, wheelchairs, walkers, the use of special transportation or the assistance of another person to leave their place of residence. There is a normal inability to leave the home and doing so requires considerable and taxing effort. Other absences are for medical reasons / religious services and are infrequent or of short duration when for other reasons). If current dressing causes regression in wound condition, may D/C ordered dressing product/s and apply Normal Saline Moist Dressing daily until next [place] / Other MD appointment. [facility]ify [facility] of regression in CLARESSA, HUGHLEY ([id number]) wound condition at [phone]. Please direct any NON-WOUND related issues/requests for orders to patient's Primary Care Physician The following medication(s) was prescribed: Cipro oral 500 mg tablet 1 1 tablet oral taken 2 times a day for 10 days starting 05/24/2018 I am going to suggest that we go ahead and proceed with a wound culture which was obtained today. I'm also gonna go ahead and send in a prescription for her to the drugstore for an antibiotic. I'm going to sin Cipro as she's not allergic to this and the doxycycline may be an in effective due to some of the other medication she is taking. We will see were things stand in one weeks time. We are gonna initiate home health for her as well. Please see above for specific wound care orders. We will see patient for re-evaluation in 1 week(s) here in the clinic. If anything worsens or changes patient will contact our office for additional recommendations. Electronic Signature(s) Signed: 05/24/2018 1:23:24 PM By: Worthy Keeler PA-C Entered By: Worthy Keeler on 05/24/2018 13:22:30 Sulewski, [name]  ([id number]) -------------------------------------------------------------------------------- ROS/PFSH Details Patient Name: [name] Date of Service: 05/24/2018 12:30 PM Medical Record Number: [id number] Patient Account Number: [id number] Date of Birth/Sex: 1923/01/26 (84 y.o. F) Treating RN: Montey Hora Primary Care Provider: FITZGERALD, DAVID Other Clinician: Referring Provider: FITZGERALD, DAVID Treating Provider/Extender: STONE III, HOYT Weeks in Treatment: 39 Information Obtained From Patient Wound History Do you currently have one or more open woundso Yes How many open wounds do you currently haveo 1 Approximately how long have you had your woundso 2 yrs How have you been treating your wound(s) until nowo mupirocin, soaking in epsom salt Has your wound(s) ever healed and then re-openedo No Have you had any lab work  done in the past montho No Have you tested positive for an antibiotic resistant organism (MRSA, VRE)o No Have you tested positive for osteomyelitis (bone infection)o No Have you had any tests for circulation on your legso Yes Who ordered the testo Dr. Lucky Cowboy [facility] the test doneo [facility] Constitutional Symptoms (General Health) Complaints and Symptoms: Negative for: Fever; Chills Cardiovascular Complaints and Symptoms: Positive for: LE edema Medical History: Positive for: Congestive Heart Failure; Hypertension Eyes Medical History: Positive for: Cataracts - surgery Respiratory Complaints and Symptoms: No Complaints or Symptoms Musculoskeletal Medical History: Positive for: Osteoarthritis Neurologic Medical History: Positive for: Neuropathy Oncologic MADORA, BARLETTA ([id number]) Medical History: Negative for: Received Chemotherapy; Received Radiation Psychiatric Complaints and Symptoms: No Complaints or Symptoms HBO Extended History Items Eyes: Cataracts Immunizations Pneumococcal Vaccine: Received Pneumococcal Vaccination:  Yes Implantable Devices Family and Social History Cancer: Yes - Father,Siblings; Diabetes: No; Heart Disease: Yes - Siblings; Hereditary Spherocytosis: No; Hypertension: No; Kidney Disease: No; Lung Disease: No; Seizures: No; Stroke: No; Thyroid Problems: No; Tuberculosis: No; Never smoker; Marital Status - Married; Alcohol Use: Never; Drug Use: No History; Caffeine Use: Rarely; Financial Concerns: No; Food, Clothing or Shelter Needs: No; Support System Lacking: No; Transportation Concerns: No; Advanced Directives: No; Patient does not want information on Advanced Directives; Do not resuscitate: No; Living Will: Yes (Not Provided); Medical Power of Attorney: No Physician Affirmation I have reviewed and agree with the above information. Electronic Signature(s) Signed: 05/24/2018 1:23:24 PM By: Worthy Keeler PA-C Signed: 05/24/2018 4:49:47 PM By: Montey Hora Entered By: Worthy Keeler on 05/24/2018 13:18:26 Mabile, [name] ([id number]) -------------------------------------------------------------------------------- SuperBill Details Patient Name: [name] Date of Service: 05/24/2018 Medical Record Number: [id number] Patient Account Number: [id number] Date of Birth/Sex: 1923-06-08 (84 y.o. 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

## 2018-05-30 NOTE — Progress Notes (Signed)
[name] ([id number]) Visit Report for 05/24/2018 Arrival Information Details Patient Name: [name] Date of Service: 05/24/2018 12:30 PM Medical Record Number: [id number] Patient Account Number: [id number] Date of Birth/Sex: [birth year] (84 y.o. F) Treating RN: Secundino Ginger Primary Care Love Chowning: FITZGERALD, DAVID Other Clinician: Referring Taos Tapp: FITZGERALD, DAVID Treating Karlene Southard/Extender: STONE III, HOYT Weeks in Treatment: 30 Visit Information History Since Last Visit Added or deleted any medications: No Patient Arrived: Ambulatory Any new allergies or adverse reactions: No Arrival Time: 12:43 Had a fall or experienced change in No Accompanied By: dtr activities of daily living that may affect Transfer Assistance: None risk of falls: Patient Identification Verified: Yes Signs or symptoms of abuse/neglect since last visito No Secondary Verification Process Yes Hospitalized since last visit: No Completed: Implantable device outside of the clinic excluding No Patient Requires Transmission-Based No cellular tissue based products placed in the center Precautions: since last visit: Patient Has Alerts: Yes Has Dressing in Place as Prescribed: Yes Patient Alerts: ABI [facility] 03/29/18 L Has Compression in Place as Prescribed: Yes 1.05 Pain Present Now: No R 1.03, TBI L .56, R .85 Electronic Signature(s) Signed: 05/28/2018 11:20:16 AM By: Secundino Ginger Entered By: Secundino Ginger on 05/24/2018 12:44:24 [name] ([id number]) -------------------------------------------------------------------------------- Compression Therapy Details Patient Name: [name] Date of Service: 05/24/2018 12:30 PM Medical Record Number: [id number] Patient Account Number: [id number] Date of Birth/Sex: 1923-07-17 (84 y.o. F) Treating RN: Montey Hora Primary Care Rozlynn Lippold: FITZGERALD, DAVID Other Clinician: Referring Aaryana Betke: FITZGERALD, DAVID Treating Rox Mcgriff/Extender:  STONE III, HOYT Weeks in Treatment: 41 Compression Therapy Performed for Wound Assessment: Wound #1 Right,Lateral Malleolus Performed By: Clinician Montey Hora, RN Compression Type: Three Layer Pre Treatment ABI: 1 Post Procedure Diagnosis Same as Pre-procedure Electronic Signature(s) Signed: 05/24/2018 4:49:47 PM By: Montey Hora Entered By: Montey Hora on 05/24/2018 13:16:03 [name] ([id number]) -------------------------------------------------------------------------------- Compression Therapy Details Patient Name: [name] Date of Service: 05/24/2018 12:30 PM Medical Record Number: [id number] Patient Account Number: [id number] Date of Birth/Sex: 10/27/1923 (84 y.o. F) Treating RN: Montey Hora Primary Care Jaquelin Meaney: FITZGERALD, DAVID Other Clinician: Referring Aithan Farrelly: FITZGERALD, DAVID Treating Lataja Newland/Extender: STONE III, HOYT Weeks in Treatment: 41 Compression Therapy Performed for Wound Assessment: Wound #2 Left,Lateral Malleolus Performed By: Clinician Montey Hora, RN Compression Type: Three Layer Pre Treatment ABI: 1 Post Procedure Diagnosis Same as Pre-procedure Electronic Signature(s) Signed: 05/24/2018 4:49:47 PM By: Montey Hora Entered By: Montey Hora on 05/24/2018 13:16:03 [name] ([id number]) -------------------------------------------------------------------------------- Encounter Discharge Information Details Patient Name: [name] Date of Service: 05/24/2018 12:30 PM Medical Record Number: [id number] Patient Account Number: [id number] Date of Birth/Sex: Apr 06, 1923 (84 y.o. F) Treating RN: Ahmed Prima Primary Care Chavon Lucarelli: FITZGERALD, DAVID Other Clinician: Referring Erilyn Pearman: FITZGERALD, DAVID Treating Terrence Wishon/Extender: STONE III, HOYT Weeks in Treatment: 36 Encounter Discharge Information Items Discharge Condition: Stable Ambulatory Status: Ambulatory Discharge Destination:  Home Transportation: Private Auto Accompanied By: daughter Schedule Follow-up Appointment: Yes Clinical Summary of Care: Electronic Signature(s) Signed: 05/24/2018 1:37:22 PM By: [name] Entered By: [name] on 05/24/2018 13:37:22 [name] ([id number]) -------------------------------------------------------------------------------- Lower Extremity Assessment Details Patient Name: [name] Date of Service: 05/24/2018 12:30 PM Medical Record Number: [id number] Patient Account Number: [id number] Date of Birth/Sex: June 10, 1923 (84 y.o. F) Treating RN: Secundino Ginger Primary Care Ronni Osterberg: FITZGERALD, DAVID Other Clinician: Referring Scottlyn Mchaney: FITZGERALD, DAVID Treating Alphonso Gregson/Extender: STONE III, HOYT Weeks in Treatment: 41 Edema Assessment Assessed: [Left: No] [Right: No] [Left: Edema] [Right: :] Calf Left: Right: Point of Measurement: 29 cm From  Medial Instep 30.5 cm 30.5 cm Ankle Left: Right: Point of Measurement: 12 cm From Medial Instep 20.2 cm 21.5 cm Vascular Assessment Pulses: Dorsalis Pedis Palpable: [Left:Yes] [Right:Yes] Posterior Tibial Extremity colors, hair growth, and conditions: Extremity Color: [Left:Hyperpigmented] [Right:Hyperpigmented] Temperature of Extremity: [Left:Warm] [Right:Warm] Capillary Refill: [Left:< 3 seconds] [Right:< 3 seconds] Toe Nail Assessment Left: Right: Thick: Yes Yes Discolored: Yes Yes Deformed: Yes Yes Improper Length and Hygiene: No No Electronic Signature(s) Signed: 05/28/2018 11:20:16 AM By: Secundino Ginger Entered By: Secundino Ginger on 05/24/2018 12:58:41 [name] ([id number]) -------------------------------------------------------------------------------- Multi Wound Chart Details Patient Name: [name] Date of Service: 05/24/2018 12:30 PM Medical Record Number: [id number] Patient Account Number: [id number] Date of Birth/Sex: December 26, 1922 (84 y.o. F) Treating RN: Montey Hora Primary Care Sidney Kann: FITZGERALD, DAVID Other Clinician: Referring Synethia Endicott: FITZGERALD, DAVID Treating Kaegan Stigler/Extender: STONE III, HOYT Weeks in Treatment: 41 Vital Signs Height(in): 65 Pulse(bpm): 90 Weight(lbs): 154.3 Blood Pressure(mmHg): 141/66 Body Mass Index(BMI): 26 Temperature(F): 98.3 Respiratory Rate 16 (breaths/min): Photos: [1:No Photos] [2:No Photos] [N/A:N/A] Wound Location: [1:Right, Lateral Malleolus] [2:Left Malleolus - Lateral] [N/A:N/A] Wounding Event: [1:Gradually Appeared] [2:Gradually Appeared] [N/A:N/A] Primary Etiology: [1:Lymphedema] [2:Pressure Ulcer] [N/A:N/A] Secondary Etiology: [1:Arterial Insufficiency Ulcer] [2:N/A] [N/A:N/A] Comorbid History: [1:Cataracts, Congestive Heart Failure, Hypertension, Osteoarthritis, Neuropathy] [2:Cataracts, Congestive Heart Failure, Hypertension, Osteoarthritis, Neuropathy] [N/A:N/A] Date Acquired: [1:08/11/2015] [2:04/12/2018] [N/A:N/A] Weeks of Treatment: [1:41] [2:5] [N/A:N/A] Wound Status: [1:Open] [2:Open] [N/A:N/A] Measurements L x W x D [1:2x1.8x0.4] [2:1.5x1.3x0.2] [N/A:N/A] (cm) Area (cm) : [1:2.827] [2:1.532] [N/A:N/A] Volume (cm) : [1:1.131] [2:0.306] [N/A:N/A] % Reduction in Area: [1:-84.50%] [2:-95.20%] [N/A:N/A] % Reduction in Volume: [1:-146.40%] [2:-287.30%] [N/A:N/A] Classification: [1:Full Thickness With Exposed Support Structures] [2:Category/Stage II] [N/A:N/A] Exudate Amount: [1:Large] [2:Medium] [N/A:N/A] Exudate Type: [1:Purulent] [2:Purulent] [N/A:N/A] Exudate Color: [1:yellow, brown, green] [2:yellow, brown, green] [N/A:N/A] Foul Odor After Cleansing: [1:Yes] [2:No] [N/A:N/A] Odor Anticipated Due to [1:No] [2:N/A] [N/A:N/A] Product Use: Wound Margin: [1:Distinct, outline attached] [2:Flat and Intact] [N/A:N/A] Granulation Amount: [1:None Present (0%)] [2:None Present (0%)] [N/A:N/A] Necrotic Amount: [1:Large (67-100%)] [2:Large (67-100%)] [N/A:N/A] Necrotic Tissue: [1:Eschar,  Adherent Slough] [2:Adherent Slough] [N/A:N/A] Exposed Structures: [1:Fascia: Yes Fat Layer (Subcutaneous Tissue) Exposed: Yes] [2:Fascia: No Fat Layer (Subcutaneous Tissue) Exposed: No Tendon: No Muscle: No Joint: No Bone: No Limited to Skin Breakdown] [N/A:N/A] Epithelialization: None None N/A Periwound Skin Texture: Excoriation: No Excoriation: No N/A Induration: No Induration: No Callus: No Callus: No Crepitus: No Crepitus: No Rash: No Rash: No Scarring: No Scarring: No Periwound Skin Moisture: Maceration: Yes Maceration: Yes N/A Dry/Scaly: Yes Dry/Scaly: Yes Periwound Skin Color: Erythema: Yes Atrophie Blanche: No N/A Rubor: Yes Cyanosis: No Atrophie Blanche: No Ecchymosis: No Cyanosis: No Erythema: No Ecchymosis: No Hemosiderin Staining: No Hemosiderin Staining: No Mottled: No Mottled: No Pallor: No Pallor: No Rubor: No Erythema Location: Circumferential N/A N/A Temperature: No Abnormality No Abnormality N/A Tenderness on Palpation: Yes No N/A Wound Preparation: Ulcer Cleansing: Ulcer Cleansing: N/A Rinsed/Irrigated with Saline Rinsed/Irrigated with Saline Topical Anesthetic Applied: Topical Anesthetic Applied: Other: lidocaine 4% Other: lidocaine 4% Treatment Notes Electronic Signature(s) Signed: 05/24/2018 4:49:47 PM By: Montey Hora Entered By: Montey Hora on 05/24/2018 13:05:58 Nitta, [name] ([id number]) -------------------------------------------------------------------------------- [place] Details Patient Name: [name] Date of Service: 05/24/2018 12:30 PM Medical Record Number: [id number] Patient Account Number: [id number] Date of Birth/Sex: [birth year] (84 y.o. F) Treating RN: Montey Hora Primary Care Tryton Bodi: FITZGERALD, DAVID Other Clinician: Referring Daveyon Kitchings: FITZGERALD, DAVID Treating Estefana Taylor/Extender: STONE III, HOYT Weeks in Treatment: 53 Active Inactive ` Abuse / Safety / Falls / Self  Care Management Nursing Diagnoses: Potential for falls Goals: Patient will not experience any injury related to falls Date Initiated: 08/10/2017 Target Resolution Date: 11/10/2017 Goal Status: Active Interventions: Assess Activities of Daily Living upon admission and as needed Assess fall risk on admission and as needed Assess: immobility, friction, shearing, incontinence upon admission and as needed Notes: ` Nutrition Nursing Diagnoses: Imbalanced nutrition Potential for alteratiion in Nutrition/Potential for imbalanced nutrition Goals: Patient/caregiver agrees to and verbalizes understanding of need to use nutritional supplements and/or vitamins as prescribed Date Initiated: 08/10/2017 Target Resolution Date: 12/08/2017 Goal Status: Active Interventions: Assess patient nutrition upon admission and as needed per policy Notes: ` Orientation to the Wound Care Program Nursing Diagnoses: Knowledge deficit related to the wound healing center program Goals: Patient/caregiver will verbalize understanding of the [facility] Date Initiated: 08/10/2017 Target Resolution Date: 09/08/2017 JONNIE, KUBLY ([id number]) Goal Status: Active Interventions: Provide education on orientation to the wound center Notes: ` Pain, Acute or Chronic Nursing Diagnoses: Pain, acute or chronic: actual or potential Potential alteration in comfort, pain Goals: Patient/caregiver will verbalize adequate pain control between visits Date Initiated: 08/10/2017 Target Resolution Date: 12/08/2017 Goal Status: Active Interventions: Complete pain assessment as per visit requirements Notes: ` Wound/Skin Impairment Nursing Diagnoses: Impaired tissue integrity Knowledge deficit related to ulceration/compromised skin integrity Goals: Ulcer/skin breakdown will have a volume reduction of 80% by week 12 Date Initiated: 08/10/2017 Target Resolution Date: 12/01/2017 Goal Status:  Active Interventions: Assess patient/caregiver ability to perform ulcer/skin care regimen upon admission and as needed Notes: Electronic Signature(s) Signed: 05/24/2018 4:49:47 PM By: Montey Hora Entered By: Montey Hora on 05/24/2018 13:05:24 [name] ([id number]) -------------------------------------------------------------------------------- Pain Assessment Details Patient Name: [name] Date of Service: 05/24/2018 12:30 PM Medical Record Number: [id number] Patient Account Number: [id number] Date of Birth/Sex: Apr 23, 1923 (84 y.o. F) Treating RN: Secundino Ginger Primary Care [place]ale Marney: FITZGERALD, DAVID Other Clinician: Referring Laren Orama: FITZGERALD, DAVID Treating Kalayna Noy/Extender: STONE III, HOYT Weeks in Treatment: 54 Active Problems Location of Pain Severity and Description of Pain Patient Has Paino No Site Locations Pain Management and Medication Current Pain Management: Electronic Signature(s) Signed: 05/28/2018 11:20:16 AM By: Secundino Ginger Entered By: Secundino Ginger on 05/24/2018 12:44:42 Mcmenamy, [name] ([id number]) -------------------------------------------------------------------------------- Patient/Caregiver Education Details Patient Name: [name] Date of Service: 05/24/2018 12:30 PM Medical Record Number: [id number] Patient Account Number: [id number] Date of Birth/Gender: Jun 18, 1923 (84 y.o. F) Treating RN: Ahmed Prima Primary Care Physician: FITZGERALD, DAVID Other Clinician: Referring Physician: FITZGERALD, DAVID Treating Physician/Extender: Melburn Hake, HOYT Weeks in Treatment: 18 Education Assessment Education Provided To: Patient Education Topics Provided Wound/Skin Impairment: Handouts: Caring for Your Ulcer, Skin Care Do's and Dont's, Other: change dressing as ordered Methods: Demonstration, Explain/Verbal Responses: State content correctly Electronic Signature(s) Signed: 05/29/2018 4:02:05 PM By: [name] Entered  By: [name] on 05/24/2018 13:37:40 Ramcharan, [name] ([id number]) -------------------------------------------------------------------------------- Wound Assessment Details Patient Name: [name] Date of Service: 05/24/2018 12:30 PM Medical Record Number: [id number] Patient Account Number: [id number] Date of Birth/Sex: [birth year] (84 y.o. F) Treating RN: Secundino Ginger Primary Care Mattisen Pohlmann: FITZGERALD, DAVID Other Clinician: Referring Shantay Sonn: FITZGERALD, DAVID Treating Miller Edgington/Extender: STONE III, HOYT Weeks in Treatment: 41 Wound Status Wound Number: 1 Primary Lymphedema Etiology: Wound Location: Right, Lateral Malleolus Secondary Arterial Insufficiency Ulcer Wounding Event: Gradually Appeared Etiology: Date Acquired: 08/11/2015 Wound Status: Open Weeks Of Treatment: 41 Comorbid Cataracts, Congestive Heart Failure, Clustered Wound: No History: Hypertension, Osteoarthritis, Neuropathy Photos Photo Uploaded By: Secundino Ginger on 05/24/2018 13:25:42 Wound Measurements Length: (cm)  2 Width: (cm) 1.8 Depth: (cm) 0.4 Area: (cm) 2.827 Volume: (cm) 1.131 % Reduction in Area: -84.5% % Reduction in Volume: -146.4% Epithelialization: None Tunneling: No Wound Description Full Thickness With Exposed Support Classification: Structures Wound Margin: Distinct, outline attached Exudate Large Amount: Exudate Type: Purulent Exudate Color: yellow, brown, green Foul Odor After Cleansing: Yes Due to Product Use: No Slough/Fibrino Yes Wound Bed Granulation Amount: None Present (0%) Exposed Structure Necrotic Amount: Large (67-100%) Fascia Exposed: Yes Necrotic Quality: Eschar, Adherent Slough Fat Layer (Subcutaneous Tissue) Exposed: Yes Periwound Skin Texture Texture Color No Abnormalities Noted: No No Abnormalities Noted: No Briseno, [name] ([id number]) Callus: No Atrophie Blanche: No Crepitus: No Cyanosis: No Excoriation: No Ecchymosis: No Induration:  No Erythema: Yes Rash: No Erythema Location: Circumferential Scarring: No Hemosiderin Staining: No Mottled: No Moisture Pallor: No No Abnormalities Noted: No Rubor: Yes Dry / Scaly: Yes Maceration: Yes Temperature / Pain Temperature: No Abnormality Tenderness on Palpation: Yes Wound Preparation Ulcer Cleansing: Rinsed/Irrigated with Saline Topical Anesthetic Applied: Other: lidocaine 4%, Treatment Notes Wound #1 (Right, Lateral Malleolus) 1. Cleansed with: Clean wound with Normal Saline 2. Anesthetic Topical Lidocaine 4% cream to wound bed prior to debridement 3. Peri-wound Care: Barrier cream Moisturizing lotion 4. Dressing Applied: Other dressing (specify in notes) 5. Secondary Dressing Applied ABD Pad 7. Secured with Tape 3 Layer Compression System - Bilateral Notes silvercel, xtrasorb, unna to Engineer, production) Signed: 05/28/2018 11:20:16 AM By: Secundino Ginger Entered By: Secundino Ginger on 05/24/2018 12:59:28 [place], [name] ([id number]) -------------------------------------------------------------------------------- Wound Assessment Details Patient Name: [name] Date of Service: 05/24/2018 12:30 PM Medical Record Number: [id number] Patient Account Number: [id number] Date of Birth/Sex: 05-Feb-1923 (84 y.o. F) Treating RN: Secundino Ginger Primary Care Orion Vandervort: FITZGERALD, DAVID Other Clinician: Referring Arav Bannister: FITZGERALD, DAVID Treating Lilyauna Miedema/Extender: STONE III, HOYT Weeks in Treatment: 41 Wound Status Wound Number: 2 Primary Pressure Ulcer Etiology: Wound Location: Left Malleolus - Lateral Wound Open Wounding Event: Gradually Appeared Status: Date Acquired: 04/12/2018 Comorbid Cataracts, Congestive Heart Failure, Weeks Of Treatment: 5 History: Hypertension, Osteoarthritis, Neuropathy Clustered Wound: No Photos Photo Uploaded By: Secundino Ginger on 05/24/2018 13:25:42 Wound Measurements Length: (cm) 1.5 Width: (cm) 1.3 Depth: (cm)  0.2 Area: (cm) 1.532 Volume: (cm) 0.306 % Reduction in Area: -95.2% % Reduction in Volume: -287.3% Epithelialization: None Tunneling: No Undermining: No Wound Description Classification: Category/Stage II Foul Odor A Wound Margin: Flat and Intact Slough/Fibr Exudate Amount: Medium Exudate Type: Purulent Exudate Color: yellow, brown, green fter Cleansing: No ino Yes Wound Bed Granulation Amount: None Present (0%) Exposed Structure Necrotic Amount: Large (67-100%) Fascia Exposed: No Necrotic Quality: Adherent Slough Fat Layer (Subcutaneous Tissue) Exposed: No Tendon Exposed: No Muscle Exposed: No Joint Exposed: No Bone Exposed: No Limited to Skin Breakdown Periwound Skin Texture Potts, [name] ([id number]) Texture Color No Abnormalities Noted: No No Abnormalities Noted: No Callus: No Atrophie Blanche: No Crepitus: No Cyanosis: No Excoriation: No Ecchymosis: No Induration: No Erythema: No Rash: No Hemosiderin Staining: No Scarring: No Mottled: No Pallor: No Moisture Rubor: No No Abnormalities Noted: No Dry / Scaly: Yes Temperature / Pain Maceration: Yes Temperature: No Abnormality Wound Preparation Ulcer Cleansing: Rinsed/Irrigated with Saline Topical Anesthetic Applied: Other: lidocaine 4%, Treatment Notes Wound #2 (Left, Lateral Malleolus) 1. Cleansed with: Clean wound with Normal Saline 2. Anesthetic Topical Lidocaine 4% cream to wound bed prior to debridement 3. Peri-wound Care: Barrier cream Moisturizing lotion 4. Dressing Applied: Other dressing (specify in notes) 5. Secondary Dressing Applied ABD Pad 7. Secured with Tape 3  Layer Compression System - Bilateral Notes silvercel, xtrasorb, unna to Engineer, production) Signed: 05/28/2018 11:20:16 AM By: Secundino Ginger Entered By: Secundino Ginger on 05/24/2018 12:55:28 [place], [name]  ([id number]) -------------------------------------------------------------------------------- Vitals Details Patient Name: [name] Date of Service: 05/24/2018 12:30 PM Medical Record Number: [id number] Patient Account Number: [id number] Date of Birth/Sex: 07-16-1923 (84 y.o. F) Treating RN: Secundino Ginger Primary Care Miamor Ayler: FITZGERALD, DAVID Other Clinician: Referring Sahej Hauswirth: FITZGERALD, DAVID Treating Johnni Wunschel/Extender: STONE III, HOYT Weeks in Treatment: 41 Vital Signs Time Taken: 12:30 Temperature (F): 98.3 Height (in): 65 Pulse (bpm): 90 Weight (lbs): 154.3 Respiratory Rate (breaths/min): 16 Body Mass Index (BMI): 25.7 Blood Pressure (mmHg): 141/66 Reference Range: 80 - 120 mg / dl Electronic Signature(s) Signed: 05/28/2018 11:20:16 AM By: Secundino Ginger Entered By: Secundino Ginger on 05/24/2018 12:45:07

## 2018-05-31 ENCOUNTER — Encounter: Payer: Medicare Other | Admitting: Physician Assistant

## 2018-05-31 DIAGNOSIS — L89523 Pressure ulcer of left ankle, stage 3: Secondary | ICD-10-CM | POA: Diagnosis not present

## 2018-06-02 ENCOUNTER — Emergency Department: Payer: Medicare Other

## 2018-06-02 ENCOUNTER — Emergency Department
Admission: EM | Admit: 2018-06-02 | Discharge: 2018-06-02 | Disposition: A | Discharge status: Home / Self Care | Payer: Medicare Other | Attending: Emergency Medicine | Admitting: Emergency Medicine

## 2018-06-02 DIAGNOSIS — S51011A Laceration without foreign body of right elbow, initial encounter: Secondary | ICD-10-CM | POA: Insufficient documentation

## 2018-06-02 DIAGNOSIS — Z79899 Other long term (current) drug therapy: Secondary | ICD-10-CM | POA: Insufficient documentation

## 2018-06-02 DIAGNOSIS — Y9301 Activity, walking, marching and hiking: Secondary | ICD-10-CM | POA: Diagnosis not present

## 2018-06-02 DIAGNOSIS — E039 Hypothyroidism, unspecified: Secondary | ICD-10-CM | POA: Diagnosis not present

## 2018-06-02 DIAGNOSIS — Z950 Presence of cardiac pacemaker: Secondary | ICD-10-CM | POA: Diagnosis not present

## 2018-06-02 DIAGNOSIS — W010XXA Fall on same level from slipping, tripping and stumbling without subsequent striking against object, initial encounter: Secondary | ICD-10-CM | POA: Diagnosis not present

## 2018-06-02 DIAGNOSIS — Z7982 Long term (current) use of aspirin: Secondary | ICD-10-CM | POA: Insufficient documentation

## 2018-06-02 DIAGNOSIS — Z23 Encounter for immunization: Secondary | ICD-10-CM | POA: Insufficient documentation

## 2018-06-02 DIAGNOSIS — Y999 Unspecified external cause status: Secondary | ICD-10-CM | POA: Insufficient documentation

## 2018-06-02 DIAGNOSIS — Y929 Unspecified place or not applicable: Secondary | ICD-10-CM | POA: Insufficient documentation

## 2018-06-02 MED ORDER — LIDOCAINE-EPINEPHRINE 1 %-1:100000 IJ SOLN
INTRAMUSCULAR | Status: AC
  Filled 2018-06-02: qty 1

## 2018-06-02 MED ORDER — TETANUS-DIPHTH-ACELL PERTUSSIS 5-2.5-18.5 LF-MCG/0.5 IM SUSP
0.5000 mL | Freq: Once | INTRAMUSCULAR | Status: AC
  Administered 2018-06-02: 0.5 mL via INTRAMUSCULAR
  Filled 2018-06-02: qty 0.5

## 2018-06-02 NOTE — Progress Notes (Signed)
[name], [name] ([id number]) Visit Report for 05/31/2018 Arrival Information Details Patient Name: [name], [name] Date of Service: 05/31/2018 12:30 PM Medical Record Number: [id number] Patient Account Number: [id number] Date of Birth/Sex: 1923-04-13 (84 y.o. F) Treating RN: Secundino Ginger Primary Care Milind Raether: FITZGERALD, DAVID Other Clinician: Referring Virgilia Quigg: FITZGERALD, DAVID Treating Robel Wuertz/Extender: STONE III, HOYT Weeks in Treatment: 43 Visit Information History Since Last Visit Added or deleted any medications: No Patient Arrived: Cane Any new allergies or adverse reactions: No Arrival Time: 12:29 Had a fall or experienced change in No Accompanied By: dtr activities of daily living that may affect Transfer Assistance: None risk of falls: Patient Identification Verified: Yes Signs or symptoms of abuse/neglect since last visito No Secondary Verification Process Yes Hospitalized since last visit: No Completed: Implantable device outside of the clinic excluding No Patient Requires Transmission-Based No cellular tissue based products placed in the center Precautions: since last visit: Patient Has Alerts: Yes Has Dressing in Place as Prescribed: Yes Patient Alerts: ABI AVVS 03/29/18 Pain Present Now: No L 1.05 R 1.03, TBI L .56, R .85 Electronic Signature(s) Signed: 05/31/2018 1:01:46 PM By: Secundino Ginger Entered By: Secundino Ginger on 05/31/2018 12:30:25 Chumney, [name] ([id number]) -------------------------------------------------------------------------------- Lower Extremity Assessment Details Patient Name: [name] Date of Service: 05/31/2018 12:30 PM Medical Record Number: [id number] Patient Account Number: [id number] Date of Birth/Sex: Oct 21, 1923 (84 y.o. F) Treating RN: Secundino Ginger Primary Care Laurali Goddard: FITZGERALD, DAVID Other Clinician: Referring Arlyn Bumpus: FITZGERALD, DAVID Treating Ulis Kaps/Extender: STONE III, HOYT Weeks in Treatment: 42 Edema  Assessment Assessed: [Left: No] [Right: No] [Left: Edema] [Right: :] Calf Left: Right: Point of Measurement: 29 cm From Medial Instep 28 cm 29 cm Ankle Left: Right: Point of Measurement: 12 cm From Medial Instep 18 cm 18 cm Vascular Assessment Claudication: Claudication Assessment [Left:None] [Right:None] Pulses: Dorsalis Pedis Palpable: [Left:Yes] [Right:Yes] Posterior Tibial Extremity colors, hair growth, and conditions: Extremity Color: [Left:Hyperpigmented] [Right:Hyperpigmented] Hair Growth on Extremity: [Left:No] [Right:No] Temperature of Extremity: [Left:Warm] [Right:Warm] Capillary Refill: [Left:< 3 seconds] [Right:< 3 seconds] Toe Nail Assessment Left: Right: Thick: Yes Yes Discolored: Yes Yes Deformed: No No Improper Length and Hygiene: No No Electronic Signature(s) Signed: 05/31/2018 1:01:46 PM By: Secundino Ginger Entered By: Secundino Ginger on 05/31/2018 12:51:13 Endres, [name] ([id number]) -------------------------------------------------------------------------------- Multi Wound Chart Details Patient Name: [name] Date of Service: 05/31/2018 12:30 PM Medical Record Number: [id number] Patient Account Number: [id number] Date of Birth/Sex: [birth year] (84 y.o. F) Treating RN: Montey Hora Primary Care Saragrace Selke: FITZGERALD, DAVID Other Clinician: Referring Kateryn Marasigan: FITZGERALD, DAVID Treating Makynna Manocchio/Extender: STONE III, HOYT Weeks in Treatment: 42 Vital Signs Height(in): 65 Pulse(bpm): 90 Weight(lbs): 154.3 Blood Pressure(mmHg): 110/70 Body Mass Index(BMI): 26 Temperature(F): 98.3 Respiratory Rate 16 (breaths/min): Photos: [N/A:N/A] Wound Location: Right Malleolus - Lateral Left Malleolus - Lateral N/A Wounding Event: Gradually Appeared Gradually Appeared N/A Primary Etiology: Lymphedema Pressure Ulcer N/A Secondary Etiology: Arterial Insufficiency Ulcer N/A N/A Comorbid History: Cataracts, Congestive Heart Cataracts, Congestive Heart  N/A Failure, Hypertension, Failure, Hypertension, Osteoarthritis, Neuropathy Osteoarthritis, Neuropathy Date Acquired: 08/11/2015 04/12/2018 N/A Weeks of Treatment: 42 6 N/A Wound Status: Open Open N/A Measurements L x W x D 2x2x0.4 1.6x1.3x0.2 N/A (cm) Area (cm) : 3.142 1.634 N/A Volume (cm) : 1.257 0.327 N/A % Reduction in Area: -105.10% -108.20% N/A % Reduction in Volume: -173.90% -313.90% N/A Classification: Full Thickness With Exposed Category/Stage II N/A Support Structures Exudate Amount: Large Medium N/A Exudate Type: Purulent Purulent N/A Exudate Color: yellow, brown, green yellow, brown, green N/A Foul Odor After Cleansing: Yes  No N/A Odor Anticipated Due to No N/A N/A Product Use: Wound Margin: Distinct, outline attached Flat and Intact N/A Granulation Amount: None Present (0%) None Present (0%) N/A Necrotic Amount: Large (67-100%) Large (67-100%) N/A Necrotic Tissue: Eschar, Adherent [place] N/A Exposed Structures: Fat Layer (Subcutaneous Fat Layer (Subcutaneous N/A Tissue) Exposed: Yes Tissue) Exposed: Yes Fascia: No Fascia: No Orris, [name] ([id number]) Tendon: No Tendon: No Muscle: No Muscle: No Joint: No Joint: No Bone: No Bone: No Epithelialization: None None N/A Periwound Skin Texture: Excoriation: No Excoriation: No N/A Induration: No Induration: No Callus: No Callus: No Crepitus: No Crepitus: No Rash: No Rash: No Scarring: No Scarring: No Periwound Skin Moisture: Maceration: Yes Maceration: Yes N/A Dry/Scaly: Yes Dry/Scaly: Yes Periwound Skin Color: Erythema: Yes Erythema: Yes N/A Rubor: Yes Atrophie Blanche: No Atrophie Blanche: No Cyanosis: No Cyanosis: No Ecchymosis: No Ecchymosis: No Hemosiderin Staining: No Hemosiderin Staining: No Mottled: No Mottled: No Pallor: No Pallor: No Rubor: No Erythema Location: Circumferential Circumferential N/A Temperature: No Abnormality No Abnormality  N/A Tenderness on Palpation: Yes No N/A Wound Preparation: Ulcer Cleansing: Ulcer Cleansing: N/A Rinsed/Irrigated with Saline Rinsed/Irrigated with Saline Topical Anesthetic Applied: Topical Anesthetic Applied: Other: lidocaine 4% Other: lidocaine 4% Treatment Notes Electronic Signature(s) Signed: 05/31/2018 4:59:39 PM By: Montey Hora Entered By: Montey Hora on 05/31/2018 13:11:36 Asato, [name] ([id number]) -------------------------------------------------------------------------------- [place] Details Patient Name: [name] Date of Service: 05/31/2018 12:30 PM Medical Record Number: [id number] Patient Account Number: [id number] Date of Birth/Sex: 1923-06-23 (84 y.o. F) Treating RN: Montey Hora Primary Care Henrine Hayter: FITZGERALD, DAVID Other Clinician: Referring Nobel Brar: FITZGERALD, DAVID Treating Hilarie Sinha/Extender: STONE III, HOYT Weeks in Treatment: 2 Active Inactive ` Abuse / Safety / Falls / Self Care Management Nursing Diagnoses: Potential for falls Goals: Patient will not experience any injury related to falls Date Initiated: 08/10/2017 Target Resolution Date: 11/10/2017 Goal Status: Active Interventions: Assess Activities of Daily Living upon admission and as needed Assess fall risk on admission and as needed Assess: immobility, friction, shearing, incontinence upon admission and as needed Notes: ` Nutrition Nursing Diagnoses: Imbalanced nutrition Potential for alteratiion in Nutrition/Potential for imbalanced nutrition Goals: Patient/caregiver agrees to and verbalizes understanding of need to use nutritional supplements and/or vitamins as prescribed Date Initiated: 08/10/2017 Target Resolution Date: 12/08/2017 Goal Status: Active Interventions: Assess patient nutrition upon admission and as needed per policy Notes: ` Orientation to the Wound Care Program Nursing Diagnoses: Knowledge deficit related to the wound  healing center program Goals: Patient/caregiver will verbalize understanding of the [place] Program Date Initiated: 08/10/2017 Target Resolution Date: 09/08/2017 JACHELLE, FLUTY ([id number]) Goal Status: Active Interventions: Provide education on orientation to the wound center Notes: ` Pain, Acute or Chronic Nursing Diagnoses: Pain, acute or chronic: actual or potential Potential alteration in comfort, pain Goals: Patient/caregiver will verbalize adequate pain control between visits Date Initiated: 08/10/2017 Target Resolution Date: 12/08/2017 Goal Status: Active Interventions: Complete pain assessment as per visit requirements Notes: ` Wound/Skin Impairment Nursing Diagnoses: Impaired tissue integrity Knowledge deficit related to ulceration/compromised skin integrity Goals: Ulcer/skin breakdown will have a volume reduction of 80% by week 12 Date Initiated: 08/10/2017 Target Resolution Date: 12/01/2017 Goal Status: Active Interventions: Assess patient/caregiver ability to perform ulcer/skin care regimen upon admission and as needed Notes: Electronic Signature(s) Signed: 05/31/2018 4:59:39 PM By: Montey Hora Entered By: Montey Hora on 05/31/2018 13:11:13 Seal, [name] ([id number]) -------------------------------------------------------------------------------- Pain Assessment Details Patient Name: [name] Date of Service: 05/31/2018 12:30 PM Medical Record Number: [id number]  Patient Account Number: [id number] Date of Birth/Sex: [birth year] (84 y.o. F) Treating RN: Secundino Ginger Primary Care Yaresly Menzel: FITZGERALD, DAVID Other Clinician: Referring Damonie Furney: FITZGERALD, DAVID Treating Lamont Tant/Extender: STONE III, HOYT Weeks in Treatment: 73 Active Problems Location of Pain Severity and Description of Pain Patient Has Paino No Site Locations Pain Management and Medication Current Pain Management: Goals for Pain Management Topical or  injectable lidocaine is offered to patient for acute pain when surgical debridement is performed. If needed, Patient is instructed to use over the counter pain medication for the following 24-48 hours after debridement. Wound care MDs do not prescribed pain medications. Patient has chronic pain or uncontrolled pain. Patient has been instructed to make an appointment with their Primary Care Physician for pain management. Electronic Signature(s) Signed: 05/31/2018 1:01:05 PM By: Secundino Ginger Previous Signature: 05/31/2018 1:00:46 PM Version By: Secundino Ginger Entered By: Secundino Ginger on 05/31/2018 13:01:05 [place], [name] ([id number]) -------------------------------------------------------------------------------- Wound Assessment Details Patient Name: [name] Date of Service: 05/31/2018 12:30 PM Medical Record Number: [id number] Patient Account Number: [id number] Date of Birth/Sex: [birth year] (84 y.o. F) Treating RN: Secundino Ginger Primary Care Dariela Stoker: FITZGERALD, DAVID Other Clinician: Referring Oriel Rumbold: FITZGERALD, DAVID Treating Kalei Mckillop/Extender: STONE III, HOYT Weeks in Treatment: 42 Wound Status Wound Number: 1 Primary Lymphedema Etiology: Wound Location: Right Malleolus - Lateral Secondary Arterial Insufficiency Ulcer Wounding Event: Gradually Appeared Etiology: Date Acquired: 08/11/2015 Wound Status: Open Weeks Of Treatment: 42 Comorbid Cataracts, Congestive Heart Failure, Clustered Wound: No History: Hypertension, Osteoarthritis, Neuropathy Photos Photo Uploaded By: Secundino Ginger on 05/31/2018 13:00:01 Wound Measurements Length: (cm) 2 % Reduction Width: (cm) 2 % Reduction Depth: (cm) 0.4 Epitheliali Area: (cm) 3.142 Tunneling: Volume: (cm) 1.257 Underminin in Area: -105.1% in Volume: -173.9% zation: None No g: No Wound Description Full Thickness With Exposed Support Foul Odor A Classification: Structures Due to Prod Wound Margin: Distinct, outline attached  Slough/Fibr Exudate Large Amount: Exudate Type: Purulent Exudate Color: yellow, brown, green fter Cleansing: Yes uct Use: No ino Yes Wound Bed Granulation Amount: None Present (0%) Exposed Structure Necrotic Amount: Large (67-100%) Fascia Exposed: No Necrotic Quality: Eschar, Adherent Slough Fat Layer (Subcutaneous Tissue) Exposed: Yes Tendon Exposed: No Muscle Exposed: No Joint Exposed: No Bone Exposed: No Periwound Skin Texture Hinners, [name] ([id number]) Texture Color No Abnormalities Noted: No No Abnormalities Noted: No Callus: No Atrophie Blanche: No Crepitus: No Cyanosis: No Excoriation: No Ecchymosis: No Induration: No Erythema: Yes Rash: No Erythema Location: Circumferential Scarring: No Hemosiderin Staining: No Mottled: No Moisture Pallor: No No Abnormalities Noted: No Rubor: Yes Dry / Scaly: Yes Maceration: Yes Temperature / Pain Temperature: No Abnormality Tenderness on Palpation: Yes Wound Preparation Ulcer Cleansing: Rinsed/Irrigated with Saline Topical Anesthetic Applied: Other: lidocaine 4%, Electronic Signature(s) Signed: 05/31/2018 1:01:46 PM By: Secundino Ginger Entered By: Secundino Ginger on 05/31/2018 12:47:29 Snowden, [name] ([id number]) -------------------------------------------------------------------------------- Wound Assessment Details Patient Name: [name] Date of Service: 05/31/2018 12:30 PM Medical Record Number: [id number] Patient Account Number: [id number] Date of Birth/Sex: [birth year] (84 y.o. F) Treating RN: Secundino Ginger Primary Care Pama Roskos: FITZGERALD, DAVID Other Clinician: Referring Madina Galati: FITZGERALD, DAVID Treating Jahaad Penado/Extender: STONE III, HOYT Weeks in Treatment: 42 Wound Status Wound Number: 2 Primary Pressure Ulcer Etiology: Wound Location: Left Malleolus - Lateral Wound Open Wounding Event: Gradually Appeared Status: Date Acquired: 04/12/2018 Comorbid Cataracts, Congestive Heart Failure, Weeks  Of Treatment: 6 History: Hypertension, Osteoarthritis, Neuropathy Clustered Wound: No Photos Photo Uploaded By: Secundino Ginger on 05/31/2018 13:00:01 Wound Measurements Length: (cm) 1.6 Width: (cm) 1.3 Depth: (cm)  0.2 Area: (cm) 1.634 Volume: (cm) 0.327 % Reduction in Area: -108.2% % Reduction in Volume: -313.9% Epithelialization: None Tunneling: No Undermining: No Wound Description Classification: Category/Stage II Foul Odo Wound Margin: Flat and Intact Slough/F Exudate Amount: Medium Exudate Type: Purulent Exudate Color: yellow, brown, green r After Cleansing: No ibrino Yes Wound Bed Granulation Amount: None Present (0%) Exposed Structure Necrotic Amount: Large (67-100%) Fascia Exposed: No Necrotic Quality: Adherent Slough Fat Layer (Subcutaneous Tissue) Exposed: Yes Tendon Exposed: No Muscle Exposed: No Joint Exposed: No Bone Exposed: No Periwound Skin Texture Texture Color No Abnormalities Noted: No No Abnormalities Noted: No Callus: No Atrophie Blanche: No Floor, [name] ([id number]) Crepitus: No Cyanosis: No Excoriation: No Ecchymosis: No Induration: No Erythema: Yes Rash: No Erythema Location: Circumferential Scarring: No Hemosiderin Staining: No Mottled: No Moisture Pallor: No No Abnormalities Noted: No Rubor: No Dry / Scaly: Yes Maceration: Yes Temperature / Pain Temperature: No Abnormality Wound Preparation Ulcer Cleansing: Rinsed/Irrigated with Saline Topical Anesthetic Applied: Other: lidocaine 4%, Electronic Signature(s) Signed: 05/31/2018 1:01:46 PM By: Secundino Ginger Entered By: Secundino Ginger on 05/31/2018 12:48:11 Karn, [name] ([id number]) -------------------------------------------------------------------------------- [place] Details Patient Name: [name] Date of Service: 05/31/2018 12:30 PM Medical Record Number: [id number] Patient Account Number: [id number] Date of Birth/Sex: 1923-07-16 (84 y.o. F) Treating RN: Secundino Ginger Primary Care Feliz Herard: FITZGERALD, DAVID Other Clinician: Referring Keishon Chavarin: FITZGERALD, DAVID Treating Tavita Eastham/Extender: STONE III, HOYT Weeks in Treatment: 42 Vital Signs Time Taken: 12:30 Temperature (F): 98.3 Height (in): 65 Pulse (bpm): 90 Weight (lbs): 154.3 Respiratory Rate (breaths/min): 16 Body Mass Index (BMI): 25.7 Blood Pressure (mmHg): 110/70 Reference Range: 80 - 120 mg / dl Electronic Signature(s) Signed: 05/31/2018 1:01:46 PM By: Secundino Ginger Entered BySecundino Ginger on 05/31/2018 12:37:26

## 2018-06-02 NOTE — ED Notes (Signed)
Reviewed discharge instructions, follow-up care, and wound/suture care with patient. Patient verbalized understanding of all information reviewed. Patient stable, with no distress noted at this time.

## 2018-06-02 NOTE — ED Notes (Signed)
Patient assisted to toilet 

## 2018-06-02 NOTE — ED Provider Notes (Addendum)
[facility] Emergency Department Provider Note ____________________________________________   First MD Initiated Contact with Patient 06/02/18 1756     (approximate)  I have reviewed the triage vital signs and the nursing notes.   HISTORY  Chief Complaint Fall  HPI [name] is a 84 y.o. female with a history of peripheral vascular disease on aspirin who is presenting after a fall.  Patient reports that she tripped over the toe of her foot and fell onto the carpet onto her right elbow.  She sustained a large skin tear.  EMS was called and brought the patient in with a bandage overlying the wound.  They said they were able to see bone and that bleeding was difficult to control.  Patient denies hitting her head or neck and losing any consciousness.  Past Medical History:  Diagnosis Date  . Peripheral vascular disease ([place])   . Varicose veins     Patient Active Problem List   Diagnosis Date Noted  . Lymphedema 02/13/2017  . PVD (peripheral vascular disease) ([place]) 09/12/2016  . Lower limb ulcer, ankle, right, limited to breakdown of skin ([place]) 09/12/2016  . Varicose veins of lower extremities with ulcer ([place]) 09/12/2016  . Difficult or painful urination 05/10/2016  . Abnormal chest x-ray 04/07/2016  . Breath shortness 04/07/2016  . Allergic state 02/25/2016  . Back pain, chronic 02/25/2016  . HLD (hyperlipidemia) 02/25/2016  . 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 Sick sinus syndrome ([place]) 02/25/2016  . Cerebrovascular accident (CVA) ([place]) 02/25/2016  . 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 Acquired hypothyroidism 05/04/2015  . H/O respiratory system disease 05/04/2015  . Paroxysmal atrial fibrillation ([place]) 12/30/2014  . Finger injury 11/29/2014    Past Surgical History:  Procedure Laterality Date  . APPENDECTOMY    . BASAL CELL CARCINOMA EXCISION Left   . BREAST CYST EXCISION Left   . CATARACT EXTRACTION Left   .  CHOLECYSTECTOMY    . EYE SURGERY    . PACEMAKER PLACEMENT    . PERIPHERAL VASCULAR CATHETERIZATION Right 10/09/2016   Procedure: Lower Extremity Angiography;  Surgeon: Algernon Huxley, MD;  Location: [facility];  Service: Cardiovascular;  Laterality: Right;  . PERIPHERAL VASCULAR CATHETERIZATION  10/09/2016   Procedure: Lower Extremity Intervention;  Surgeon: Algernon Huxley, MD;  Location: [facility];  Service: Cardiovascular;;  . TONSILLECTOMY      Prior to Admission medications   Medication Sig Start Date End Date Taking? Authorizing Provider  acetaminophen (TYLENOL) 650 MG CR tablet Take 650 mg by mouth every 8 (eight) hours as needed for pain.    [provider]  aspirin EC 81 MG tablet Take 81 mg by mouth daily.    [provider]  Biotin 1000 MCG tablet Take 1,000 mcg by mouth daily.    [provider]  calcium carbonate (OSCAL) 1500 (600 Ca) MG TABS tablet Take 600 mg of elemental calcium by mouth daily.    [provider]  CARTIA XT 300 MG 24 hr capsule Take 300 mg by mouth daily.  09/09/16   [provider]  Cholecalciferol (VITAMIN D) 2000 units CAPS Take 2,000 Units by mouth daily.    [provider]  dabigatran (PRADAXA) 150 MG CAPS capsule Take 150 mg by mouth 2 (two) times daily.    [provider]  fluticasone (FLONASE) 50 MCG/ACT nasal spray Place into the nose. 07/24/16   [provider]  furosemide (LASIX) 40 MG tablet Take 40  mg by mouth daily.    [provider]  Glucosamine 500 MG CAPS Take 500 mg by mouth daily.    [provider]  levothyroxine (SYNTHROID, LEVOTHROID) 50 MCG tablet Take 50 mcg by mouth daily before breakfast.  09/11/16   [provider]  losartan (COZAAR) 50 MG tablet Take 50 mg by mouth daily.    [provider]  magnesium oxide (MAG-OX) 400 MG tablet Take 400 mg by mouth daily.    [provider]  metoprolol (LOPRESSOR) 50 MG  tablet Take 50 mg by mouth 2 (two) times daily. 09/22/16   [provider]  Multiple Vitamin (MULTIVITAMIN WITH MINERALS) TABS tablet Take 1 tablet by mouth daily.    [provider]  Multiple Vitamins-Minerals (PRESERVISION/LUTEIN PO) Take 1 capsule by mouth daily.    [provider]  mupirocin ointment (BACTROBAN) 2 % Apply to wound after soaking BID 05/23/17   Hyatt, Max T, DPM  NONFORMULARY OR COMPOUNDED ITEM See pharmacy note 05/23/17   Hyatt, Max T, DPM  omeprazole (PRILOSEC) 20 MG capsule Take by mouth daily.  09/09/16   [provider]  oseltamivir (TAMIFLU) 75 MG capsule  01/22/17   [provider]  potassium chloride SA (K-DUR,KLOR-CON) 20 MEQ tablet Take 20 mEq by mouth daily. 08/18/16   [provider]  rosuvastatin (CRESTOR) 5 MG tablet Take 5 mg by mouth every Monday, Wednesday, and Friday. 08/02/16   [provider]  TAZTIA XT 300 MG 24 hr capsule  03/15/18   [provider]  tolterodine (DETROL) 2 MG tablet Take 2 mg by mouth daily. 07/28/16   [provider]  traMADol (ULTRAM) 50 MG tablet Take 50 mg by mouth every 6 (six) hours as needed for pain. 09/16/16   [provider]  vitamin B-12 (CYANOCOBALAMIN) 1000 MCG tablet Take 1,000 mcg by mouth daily.    [provider]    Allergies Chlorpheniramine; Fluconazole; Metronidazole; Monistat  [tioconazole]; and Sulfa antibiotics  Family History  Problem Relation Age of Onset  . Stroke Mother   . Cancer Father     Social History Social History   Tobacco Use  . Smoking status: Never Smoker  . Smokeless tobacco: Never Used  Substance Use Topics  . Alcohol use: No  . Drug use: No    Review of Systems  Constitutional: No fever/chills Eyes: No visual changes. ENT: No sore throat. Cardiovascular: Denies chest pain. Respiratory: Denies shortness of breath. Gastrointestinal: No abdominal pain.  No nausea, no vomiting.  No diarrhea.   No constipation. Genitourinary: Negative for dysuria. Musculoskeletal: Negative for back pain. Skin: Negative for rash. Neurological: Negative for headaches, focal weakness or numbness.  ____________________________________________   PHYSICAL EXAM:  VITAL SIGNS: ED Triage Vitals  Enc Vitals Group     BP 06/02/18 1749 (!) 142/73     Pulse Rate 06/02/18 1749 84     Resp --      Temp 06/02/18 1749 98.8 F (37.1 C)     Temp Source 06/02/18 1749 Oral     SpO2 06/02/18 1749 95 %     Weight 06/02/18 1752 150 lb (68 kg)     Height 06/02/18 1752 5\' 6"  (1.676 m)     Head Circumference --      Peak Flow --      Pain Score 06/02/18 1752 3     Pain Loc --      Pain Edu? --      Excl. in  GC? --     Constitutional: Alert and oriented. Well appearing and in no acute distress. Eyes: Conjunctivae are normal.  Head: Atraumatic. Nose: No congestion/rhinnorhea. Mouth/Throat: Mucous membranes are moist.  Neck: No stridor.   Cardiovascular: Normal rate, regular rhythm. Grossly normal heart sounds.  Respiratory: Normal respiratory effort.  No retractions. Lungs CTAB. Gastrointestinal: Soft and nontender. No distention. No CVA tenderness. Musculoskeletal:   Right elbow with dorsal skin tear/laceration that is approximately 13 cm long and horizontal orientation.  I am able to see the olecranon but it is covered with fascia.  The joint does not appear to be violated.  The patient is able to range the right upper extremity the elbow without issue.  There is no tenderness to palpation.  No active bleeding at this time.  5 out of 5 strength of the bilateral lower extremities.  Pelvis is stable without tenderness.  Bilateral calves and feet with wound dressings which are CDI.  Mild edema to the bilateral lower extremity's.  Neurologic:  Normal speech and language. No gross focal neurologic deficits are appreciated. Skin:  Skin is warm, dry and intact. No rash noted. Psychiatric: Mood and affect  are normal. Speech and behavior are normal.  ____________________________________________   LABS (all labs ordered are listed, but only abnormal results are displayed)  Labs Reviewed - No data to display ____________________________________________  EKG   ____________________________________________  RADIOLOGY  Large soft tissue defect over the olecranon.  No acute osseous abnormality. ____________________________________________   PROCEDURES  Procedure(s) performed:    Marland KitchenMarland KitchenLaceration Repair Date/Time: 06/02/2018 9:02 PM Performed by: Orbie Pyo, MD Authorized by: Orbie Pyo, MD   Consent:    Consent obtained:  Verbal   Consent given by:  Patient   Risks discussed:  Infection, pain, retained foreign body, poor cosmetic result and poor wound healing Universal protocol:    Patient identity confirmed:  Verbally with patient Anesthesia (see MAR for exact dosages):    Anesthesia method:  Local infiltration   Local anesthetic:  Lidocaine 1% WITH epi Laceration details:    Location:  Shoulder/arm   Shoulder/arm location:  R elbow   Length (cm):  13   Depth (mm):  5 Repair type:    Repair type:  Complex Pre-procedure details:    Preparation:  Patient was prepped and draped in usual sterile fashion and imaging obtained to evaluate for foreign bodies Exploration:    Limited defect created (wound extended): no     Hemostasis achieved with:  Direct pressure   Wound exploration: entire depth of wound probed and visualized     Contaminated: no   Treatment:    Area cleansed with:  Saline   Amount of cleaning:  Extensive   Irrigation solution:  Sterile saline   Visualized foreign bodies/material removed: no     Debridement:  None Skin repair:    Repair method:  Sutures   Suture size:  4-0   Suture material:  Nylon (vicryl)   Suture technique:  Simple interrupted   Number of sutures: 12 4-0 nylon, 1x 4-0 subdermal vicryl. Approximation:     Approximation:  Close Post-procedure details:    Dressing:  Sterile dressing   Patient tolerance of procedure:  Tolerated well, no immediate complications    Critical Care performed:   ____________________________________________   INITIAL IMPRESSION / ASSESSMENT AND PLAN / ED COURSE  Pertinent labs & imaging results that were available during my care of the patient were reviewed by me and considered in my  medical decision making (see chart for details).  DDX: Laceration, elbow fracture, contusion, mechanical fall As part of my medical decision making, I reviewed the following data within the electronic MEDICAL RECORD NUMBER Notes from prior outpatient visits.  ----------------------------------------- 6:45 PM on 06/02/2018 -----------------------------------------  I discussed case Dr. Mack Guise of orthopedics.  We decided that the patient's laceration to be closed at the bedside.  The olecranon is by anatomic location very close to the skin and will be somewhat exposed.  However, the joint does not appear to be violated.  ----------------------------------------- 9:07 PM on 06/02/2018 -----------------------------------------  Patient with good cosmetic closure of her right elbow laceration.  The wound will be dressed and the patient will be placed in a right arm sling.  She is already taking antibiotics for coverage of foot ulcers.  She is currently wearing bilateral lower extremity dressings.  She will be following up with the wound care center for suture removal in 12 to 14 days.  She is also currently on antibiotics, question Ceftin, for wound care.  This should cover any potential skin flora as prophylaxis for the laceration as well.  She as well as family understanding of the treatment plan willing to comply.  Furthermore, she is been able to ambulate to the toilet and back without issue. ____________________________________________   FINAL CLINICAL IMPRESSION(S) / ED  DIAGNOSES  Right elbow laceration.    NEW MEDICATIONS STARTED DURING THIS VISIT:  New Prescriptions   No medications on file     Note:  This document was prepared using Dragon voice recognition software and may include unintentional dictation errors.     Orbie Pyo, MD 06/02/18 2108    Orbie Pyo, MD 06/02/18 2110    Orbie Pyo, MD 06/02/18 2110

## 2018-06-02 NOTE — ED Notes (Signed)
Clean, sterile dressing and vaseline gauze applied to suture per MD order

## 2018-06-02 NOTE — ED Notes (Signed)
MD at bedside placing sutures

## 2018-06-02 NOTE — ED Triage Notes (Addendum)
Pt presents today post mechanical fall. Pt presents with skin tear on the left arm. Pt states she hit it on the carpet.  Pt denies LOC or hitting head. Pt has family at bedside.

## 2018-06-04 NOTE — Progress Notes (Signed)
[name], [name] ([id number]) Visit Report for 05/31/2018 Chief Complaint Document Details Patient Name: [name], [name]. Date of Service: 05/31/2018 12:30 PM Medical Record Number: [id number] Patient Account Number: [id number] Date of Birth/Sex: [birth year] (84 y.o. F) Treating RN: Montey Hora Primary Care Provider: FITZGERALD, DAVID Other Clinician: Referring Provider: FITZGERALD, DAVID Treating Provider/Extender: STONE III, HOYT Weeks in Treatment: 51 Information Obtained from: Patient Chief Complaint Patient is here for right lateral malleolus and left lateral malleolus ulcer Electronic Signature(s) Signed: 06/01/2018 12:34:34 PM By: Worthy Keeler PA-C Entered By: Worthy Keeler on 05/31/2018 13:04:09 [name], [name] -------------------------------------------------------------------------------- HPI Details Patient Name: [name] Date of Service: 05/31/2018 12:30 PM Medical Record Number: [id number] Patient Account Number: [id number] Date of Birth/Sex: 1923-02-14 (84 y.o. 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 Dr. Amalia Hailey the podiatrist saw her on 07/20/2017 and sent her to the wound center for possible hyperbaric oxygen therapy. past medical history of peripheral vascular disease, varicose veins, status post appendectomy, basal cell carcinoma excision from the left leg, cholecystectomy, pacemaker placement, right lower extremity angiography done by Dr. dew in March 2017 with placement of a stent. there is also note of a successful ablation of the right small  saphenous vein done which was reviewed by ultrasound on 10/24/2016. the patient had a right small saphenous vein ablation done on 10/20/2016. The patient has never been a smoker. She has been seen by Dr. Corene Cornea dew the vascular surgeon who most recently saw her on 06/15/2017 for evaluation of ongoing problems with right leg swelling. She had a lower extremity arterial duplex examination done(02/13/17) which showed patent distal right superficial femoral artery stent and above-the-knee popliteal stent without evidence of restenosis. The ABI was more than 1.3 on the right and more than 1.3 on the left. This was consistent with noncompressible arteries due to medial calcification. The right great toe pressure and PPG waveforms are within normal limits and the left great toe pressure and PPG waveforms are decreased. he recommended she continue to wear her compression stockings and continue with elevation. She is scheduled to have a noninvasive arterial study in the near future 08/16/2017 -- had a lower extremity arterial duplex examination done which showed patent distal right superficial femoral artery stent and above-the-knee popliteal stent without evidence of restenosis. The ABI was more than 1.3 on the right and more than 1.3 on the left. This was consistent with noncompressible arteries due to medial calcification. The right great toe pressure and PPG waveforms are within normal limits and the left great toe pressure and PPG waveforms are decreased. the x-ray of the right ankle has not yet been done 08/24/2017 -- had a right ankle x-ray -- IMPRESSION:1. No fracture, bone lesion or evidence of osteomyelitis. 2. Lateral soft tissue swelling with a soft tissue ulcer. she has not yet seen the vascular surgeon for review 08/31/17 on evaluation today patient's wound appears to be showing signs of improvement. She still with her appointment with vascular in order to review her results of her vascular  study and then determine if any intervention would be recommended at that time. No fevers, chills, nausea, or vomiting noted at this time. She has  been tolerating the dressing changes without complication. 09/28/17 on evaluation today patient's wound appears to show signs of good improvement in regard to the granulation tissue which is surfacing. There is still a layer of slough covering the wound and the posterior portion is still significantly deeper than the anterior nonetheless there has been some good sign of things moving towards the better. She is going to go back to Dr. dew for reevaluation to ensure her blood flow is still appropriate. That will be before her next evaluation with [place] next week. No fevers, chills, nausea, or vomiting noted at this time. Patient does have some discomfort rated to be a 3-4/10 depending on activity specifically cleansing the wound makes it worse. 10/05/2017 -- the patient was seen by Dr. Lucky Cowboy last week and noninvasive studies showed a normal right ABI with brisk triphasic waveforms consistent with no arterial insufficiency including normal digital pressures. The duplex showed a patent distal right SFA stent and the proximal SFA was also normal. He was pleased with her test and thought she should have enough of perfusion for normal wound healing. He would see her back in 6 months time. 12/21/17 on evaluation today patient appears to be doing fairly well in regard to her right lateral ankle wound. Unfortunately the main issue that she is expansion at this point is that she is having some issues with what appears to be some cellulitis in the [name], [name]. ([id number]) right anterior shin. She has also been noting a little bit of uncomfortable feeling especially last night and her ankle area. I'm afraid that she made the developing a little bit of an infection. With that being said I think it is in the early stages. 12/28/17 on evaluation today patient's ankle  appears to be doing excellent. She's making good progress at this point the cellulitis seems to have improved after last week's evaluation. Overall she is having no significant discomfort which is excellent news. She does have an appointment with Dr. dew on March 29, 2018 for reevaluation in regard to the stent he placed. She seems to have excellent blood flow in the right lower extremity. 01/19/12 on evaluation today patient's wound appears to be doing very well. In fact she does not appear to require debridement at this point, there's no evidence of infection, and overall from the standpoint of the wound she seems to be doing very well. With that being said I believe that it may be time to switch to different dressing away from the [facility] Dressing she tells me she does have a lot going on her friend actually passed away yesterday and she's also having a lot of issues with her husband this obviously is weighing heavy on her as far as your thoughts and concerns today. 01/25/18 on evaluation today patient appears to be doing fairly well in regard to her right lateral malleolus. She has been tolerating the dressing changes without complication. Overall I feel like this is definitely showing signs of improvement as far as how the overall appearance of the wound is there's also evidence of epithelium start to migrate over the granulation tissue. In general I think that she is progressing nicely as far as the wound is concerned. The only concern she really has is whether or not we can switch to every other week visits in order to avoid having as many appointments as her daughters have a difficult time getting her to her appointments as well as the patient's husband to his he is  not doing very well at this point. 02/22/18 on evaluation today patient's right lateral malleolus ulcer appears to be doing great. She has been tolerating the dressing changes without complication. Overall you making excellent  progress at this time. Patient is having no significant discomfort. 03/15/18 on evaluation today patient appears to be doing much more poorly in regard to her right lateral ankle ulcer at this point. Unfortunately since have last seen her her husband has passed just a few days ago is obviously weighed heavily on her her daughter also had surgery well she is with her today as usual. There does not appear to be any evidence of infection she does seem to have significant contusion/deep tissue injury to the right lateral malleolus which was not noted previous when I saw her last. It's hard to tell of exactly when this injury occurred although during the time she was spending the night in the hospital this may have been most likely. 03/22/18 on evaluation today patient appears to actually be doing very well in regard to her ulcer. She did unfortunately have a setback which was noted last week however the good news is we seem to be getting back on track and in fact the wound in the core did still have some necrotic tissue which will be addressed at this point today but in general I'm seeing signs that things are on the up and up. She is glad to hear this obviously she's been somewhat concerned that due to the how her wound digressed more recently. 03/29/18 on evaluation today patient appears to be doing fairly well in regard to her right lower extremity lateral malleolus ulcer. She unfortunately does have a new area of pressure injury over the inferior portion where the wound has opened up a little bit larger secondary to the pressure she seems to be getting. She does tell me sometimes when she sleeps at night that it actually hurts and does seem to be pushing on the area little bit more unfortunately. There does not appear to be any evidence of infection which is good news. She has been tolerating the dressing changes without complication. She also did have some bruising in the left second and third toes  due to the fact that she may have bump this or injured it although she has neuropathy so she does not feel she did move recently that may have been where this came from. Nonetheless there does not appear to be any evidence of infection at this time. 04/12/18 on evaluation today patient's wound on the right lateral ankle actually appears to be doing a little bit better with a lot of necrotic docking tissue centrally loosening up in clearing away. However she does have the beginnings of a deep tissue injury on the left lateral malleolus likely due to the fact we've been trying offload the right as much as we have. I think she may benefit from an assistive soft device to help with offloading and it looks like they're looking at one of the doughnut conditions that wraps around the lower leg to offload which I think will definitely do a good job. With that being said I think we definitely need to address this issue on the left before it becomes a wound. Patient is not having significant pain. 04/19/18 on evaluation today patient appears to be doing excellent in regard to the progress she's made with her right lateral ankle ulcer. The left ankle region which did show evidence of a deep tissue injury seems  to be resolving there's little fluid noted underneath and a blister there's nothing open at this point in time overall I feel like this is progressing nicely which is good news. She does not seem to be having significant discomfort at this point which is also good news. 04/25/18-She is here in follow up evaluation for bilateral lateral malleolar ulcers. The right lateral malleolus ulcer with pale subcutaneous tissue exposure, central area of ulcer with tendon/periosteum exposed. The left lateral malleolus ulcer now with [name], [name] ([id number]) central area of nonviable tissue, otherwise deep tissue injury. She is wearing compression wraps to the left lower extremity, she will place the right lower  extremity compression wraps on when she gets home. She will be out of town over the weekend and return next week and follow-up appointment. She completed her doxycycline this morning 05/03/18 on evaluation today patient appears to be doing very well in regard to her right lateral ankle ulcer in general. At least she's showing some signs of improvement in this regard. Unfortunately she has some additional injury to the left lateral malleolus region which appears to be new likely even over the past several days. Again this determination is based on the overall appearance. With that being said the patient is obviously frustrated about this currently. 05/10/18-She is here in follow-up evaluation for bilateral lateral malleolar ulcers. She states she has purchased offloading shoes/boots and they will arrive tomorrow. She was asked to bring them in the office at next week's appointment so her provider is aware of product being utilized. She continues to sleep on right or left side, she has been encouraged to sleep on her back. The right lateral malleolus ulcer is precariously close to peri-osteum; will order xray. The left lateral malleolus ulcer is improved. Will switch back to santyl; she will follow up next week. 05/17/18 on evaluation today patient actually appears to be doing very well in regard to her malleolus her ulcers compared to last time I saw them. She does not seem to have as much in the way of contusion at this point which is great news. With that being said she does continue to have discomfort and I do believe that she is still continuing to benefit from the offloading/pressure reducing boots that were recommended. I think this is the key to trying to get this to heal up completely. 05/24/18 on evaluation today patient actually appears to be doing worse at this point in time unfortunately compared to her last week's evaluation. She is having really no increased pain which is good news  unfortunately she does have more maceration in your theme and noted surrounding the right lateral ankle the left lateral ankle is not really is erythematous I do not see signs of the overt cellulitis on that side. Unfortunately the wounds do not seem to have shown any signs of improvement since the last evaluation. She also has significant swelling especially on the right compared to previous some of this may be due to infection however also think that she may be served better while she has these wounds by compression wrapping versus continuing to use the Juxta-Lite for the time being. Especially with the amount of drainage that she is experiencing at this point. No fevers, chills, nausea, or vomiting noted at this time. 05/31/18 on evaluation today patient appears to actually be doing better in regard to her right lateral lower extremity ulcer specifically on the malleolus region. She has been tolerating the antibiotic without complication. With that being  said she still continues to have issues but a little bit of redness although nothing like she what she was experiencing previous. She still continues to pressure to her ankle area she did get the problem on offloading boots unfortunately she will not wear them she states there too uncomfortable and she can't get in and out of the bed. Nonetheless at this point her wounds seem to be continually getting worse which is not what we want I'm getting somewhat concerned about her progress and how things are going to proceed if we do not intervene in some way shape or form. I therefore had a very lengthy conversation today about offloading yet again and even made a specific suggestion for switching her to a memory foam mattress and even gave the information for a specific one that they could look at getting if it was something that they were interested in considering. She does not want to be considered for a hospital bed air mattress although honestly  insurance would not cover it that she does not have any wounds on her trunk. Electronic Signature(s) Signed: 06/01/2018 12:34:34 PM By: Worthy Keeler PA-C Entered By: Worthy Keeler on 06/01/2018 11:56:56 [name], [name] ([id number]) -------------------------------------------------------------------------------- Physical Exam Details Patient Name: [name] Date of Service: 05/31/2018 12:30 PM Medical Record Number: [id number] Patient Account Number: [id number] Date of Birth/Sex: 1923/10/09 (84 y.o. F) Treating RN: Montey Hora Primary Care Provider: FITZGERALD, DAVID Other Clinician: Referring Provider: FITZGERALD, DAVID Treating Provider/Extender: STONE III, HOYT Weeks in Treatment: 54 Constitutional Well-nourished and well-hydrated in no acute distress. Respiratory normal breathing without difficulty. clear to auscultation bilaterally. Cardiovascular regular rate and rhythm with normal S1, S2. trace pitting edema of the bilateral lower extremities. Psychiatric this patient is able to make decisions and demonstrates good insight into disease process. Alert and Oriented x 3. pleasant and cooperative. Notes Patient's wounds today actually show signs of improvement as far as the infection is concerned unfortunately she still continues to be getting pressure to the lateral malleolus region which is causing the wounds cannot be that heal and in the case of the left ulcer it's actually getting worse. There does not appear to be any evidence of active this can infection this is definitely resolving. Electronic Signature(s) Signed: 06/01/2018 12:34:34 PM By: Worthy Keeler PA-C Entered By: Worthy Keeler on 06/01/2018 11:57:45 [name], [name] ([id number]) -------------------------------------------------------------------------------- Physician Orders Details Patient Name: [name] Date of Service: 05/31/2018 12:30 PM Medical Record Number:  [id number] Patient Account Number: [id number] Date of Birth/Sex: 08-Feb-1923 (84 y.o. F) Treating RN: Montey Hora Primary Care Provider: FITZGERALD, DAVID Other Clinician: Referring Provider: FITZGERALD, DAVID Treating Provider/Extender: STONE III, HOYT Weeks in Treatment: 47 Verbal / Phone Orders: No Diagnosis Coding ICD-10 Coding Code Description 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, stage 4 I70.233 Atherosclerosis of native arteries of right leg with ulceration of ankle I89.0 Lymphedema, not elsewhere classified B35.4 Tinea corporis L89.523 Pressure ulcer of left ankle, stage 3 Wound Cleansing Wound #1 Right,Lateral Malleolus o Clean wound with Normal Saline. o Cleanse wound with mild soap and water o May Shower, gently pat wound dry prior to applying new dressing. Wound #2 Left,Lateral Malleolus o Clean wound with Normal Saline. o Cleanse wound with mild soap and water o May Shower, gently pat wound dry prior to applying new dressing. Anesthetic (add to Medication List) Wound #1 Right,Lateral Malleolus o Topical Lidocaine 4% cream applied to wound bed prior to debridement (In Clinic Only). Wound #2 Left,Lateral  Malleolus o Topical Lidocaine 4% cream applied to wound bed prior to debridement (In Clinic Only). Primary Wound Dressing Wound #1 Right,Lateral Malleolus o Silver Alginate Wound #2 Left,Lateral Malleolus o Silver Alginate Secondary Dressing Wound #1 Right,Lateral Malleolus o ABD pad o XtraSorb Wound #2 Left,Lateral Malleolus o ABD pad o LAELYNN, BLIZZARD ([id number]) Dressing Change Frequency Wound #1 Right,Lateral Malleolus o Change Dressing Monday, Wednesday, Friday Wound #2 Left,Lateral Malleolus o Change Dressing Monday, Wednesday, Friday Follow-up Appointments Wound #1 Right,Lateral Malleolus o Return Appointment in 1 week. Wound #2 Left,Lateral Malleolus o Return Appointment in 1 week. Edema  Control Wound #1 Right,Lateral Malleolus o 3 Layer Compression System - Bilateral o Elevate legs to the level of the heart and pump ankles as often as possible Wound #2 Left,Lateral Malleolus o 3 Layer Compression System - Bilateral o Elevate legs to the level of the heart and pump ankles as often as possible Off-Loading Wound #1 Right,Lateral Malleolus o Turn and reposition every 2 hours o Other: - do not put pressure on your right ankle Wound #2 Left,Lateral Malleolus o Turn and reposition every 2 hours o Other: - do not put pressure on your right ankle Additional Orders / Instructions Wound #1 Right,Lateral Malleolus o Increase protein intake. o Other: - Add Vitamin C, Zinc, Vitamin A to your diet Wound #2 Left,Lateral Malleolus o Increase protein intake. o Other: - Add Vitamin C, Zinc, Vitamin A to your diet Home Health Wound #1 [place] Nurse may visit PRN to address patientos wound care needs. o FACE TO FACE ENCOUNTER: MEDICARE and MEDICAID PATIENTS: I certify that this patient is under my care and that I had a face-to-face encounter that meets the physician face-to-face encounter requirements with this patient on this date. The encounter with the patient was in whole or in part for the following MEDICAL CONDITION: (primary reason for [place]) MEDICAL NECESSITY: I certify, that based on my findings, NURSING services are a medically necessary home health service. HOME BOUND STATUS: I certify that my clinical findings support that this patient is homebound (i.e., Due to illness or injury, pt requires aid of supportive devices such as crutches, cane, wheelchairs, walkers, the use of special transportation or the assistance of another person to leave their place of residence. There is a normal inability to leave the home and doing so requires considerable and taxing effort. Other  absences are for medical reasons / religious services and are infrequent or of short duration when for other reasons). [name], [name] ([id number]) o If current dressing causes regression in wound condition, may D/C ordered dressing product/s and apply Normal Saline Moist Dressing daily until next [place] / Other MD appointment. [place] of regression in wound condition at [phone]. o Please direct any NON-WOUND related issues/requests for orders to patient's Primary Care Physician Wound #2 [place] Nurse may visit PRN to address patientos wound care needs. o FACE TO FACE ENCOUNTER: MEDICARE and MEDICAID PATIENTS: I certify that this patient is under my care and that I had a face-to-face encounter that meets the physician face-to-face encounter requirements with this patient on this date. The encounter with the patient was in whole or in part for the following MEDICAL CONDITION: (primary reason for [place]) MEDICAL NECESSITY: I certify, that based on my findings, NURSING services are a medically necessary home health service. HOME BOUND STATUS: I certify  that my clinical findings support that this patient is homebound (i.e., Due to illness or injury, pt requires aid of supportive devices such as crutches, cane, wheelchairs, walkers, the use of special transportation or the assistance of another person to leave their place of residence. There is a normal inability to leave the home and doing so requires considerable and taxing effort. Other absences are for medical reasons / religious services and are infrequent or of short duration when for other reasons). o If current dressing causes regression in wound condition, may D/C ordered dressing product/s and apply Normal Saline Moist Dressing daily until next [place] / Other MD appointment. [place] of  regression in wound condition at [phone]. o Please direct any NON-WOUND related issues/requests for orders to patient's Primary Care Physician Patient Medications Allergies: sulfa, metronidazole, monistat Notifications Medication Indication Start End Cipro 05/31/2018 DOSE 1 - oral 500 mg tablet - 1 tablet oral taken 2 times a day for 10 days to extend the current regimen Electronic Signature(s) Signed: 05/31/2018 2:48:06 PM By: Worthy Keeler PA-C Entered By: Worthy Keeler on 05/31/2018 14:48:05 [place]ank[place], [name] ([id number]) -------------------------------------------------------------------------------- Problem List Details Patient Name: [name] Date of Service: 05/31/2018 12:30 PM Medical Record Number: [id number] Patient Account Number: [id number] Date of Birth/Sex: [birth year] (84 y.o. F) Treating RN: Montey Hora Primary Care Provider: FITZGERALD, DAVID Other Clinician: Referring Provider: FITZGERALD, DAVID Treating Provider/Extender: STONE III, HOYT Weeks in Treatment: 19 Active Problems ICD-10 Evaluated Encounter Code Description Active Date Today Diagnosis 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, stage 4 05/10/2018 No Yes I70.233 Atherosclerosis of native arteries of right leg with ulceration of 08/10/2017 No Yes ankle I89.0 Lymphedema, not elsewhere classified 08/10/2017 No Yes B35.4 Tinea corporis 09/28/2017 No Yes L89.523 Pressure ulcer of left ankle, stage 3 04/25/2018 No Yes Inactive Problems Resolved Problems Electronic Signature(s) Signed: 06/01/2018 12:34:34 PM By: Worthy Keeler PA-C Entered By: Worthy Keeler on 05/31/2018 13:04:03 Mcgeehan, [name] ([id number]) -------------------------------------------------------------------------------- Progress Note Details Patient Name: [name] Date of Service: 05/31/2018 12:30 PM Medical Record Number: [id number] Patient Account Number: [id number] Date of Birth/Sex: Mar 25, 1923 (84 y.o.  F) Treating RN: Montey Hora Primary Care Provider: FITZGERALD, DAVID Other Clinician: Referring Provider: FITZGERALD, DAVID Treating Provider/Extender: STONE III, HOYT Weeks in Treatment: 42 Subjective Chief Complaint Information obtained from Patient Patient is here for right lateral malleolus and left lateral malleolus ulcer History of Present Illness (HPI) 84 year old patient who most recently has been seeing both podiatry and vascular surgery for a long-standing ulcer of her right lateral malleolus which has been treated with various methodologies. Dr. Amalia Hailey the podiatrist saw her on 07/20/2017 and sent her to the wound center for possible hyperbaric oxygen therapy. past medical history of peripheral vascular disease, varicose veins, status post appendectomy, basal cell carcinoma excision from the left leg, cholecystectomy, pacemaker placement, right lower extremity angiography done by Dr. dew in March 2017 with placement of a stent. there is also note of a successful ablation of the right small saphenous vein done which was reviewed by ultrasound on 10/24/2016. the patient had a right small saphenous vein ablation done on 10/20/2016. The patient has never been a smoker. She has been seen by Dr. Corene Cornea dew the vascular surgeon who most recently saw her on 06/15/2017 for evaluation of ongoing problems with right leg swelling. She had a lower extremity arterial duplex examination done(02/13/17) which showed patent distal right superficial femoral artery stent and above-the-knee popliteal stent without evidence  of restenosis. The ABI was more than 1.3 on the right and more than 1.3 on the left. This was consistent with noncompressible arteries due to medial calcification. The right great toe pressure and PPG waveforms are within normal limits and the left great toe pressure and PPG waveforms are decreased. he recommended she continue to wear her compression stockings and continue with  elevation. She is scheduled to have a noninvasive arterial study in the near future 08/16/2017 -- had a lower extremity arterial duplex examination done which showed patent distal right superficial femoral artery stent and above-the-knee popliteal stent without evidence of restenosis. The ABI was more than 1.3 on the right and more than 1.3 on the left. This was consistent with noncompressible arteries due to medial calcification. The right great toe pressure and PPG waveforms are within normal limits and the left great toe pressure and PPG waveforms are decreased. the x-ray of the right ankle has not yet been done 08/24/2017 -- had a right ankle x-ray -- IMPRESSION:1. No fracture, bone lesion or evidence of osteomyelitis. 2. Lateral soft tissue swelling with a soft tissue ulcer. she has not yet seen the vascular surgeon for review 08/31/17 on evaluation today patient's wound appears to be showing signs of improvement. She still with her appointment with vascular in order to review her results of her vascular study and then determine if any intervention would be recommended at that time. No fevers, chills, nausea, or vomiting noted at this time. She has been tolerating the dressing changes without complication. 09/28/17 on evaluation today patient's wound appears to show signs of good improvement in regard to the granulation tissue which is surfacing. There is still a layer of slough covering the wound and the posterior portion is still significantly deeper than the anterior nonetheless there has been some good sign of things moving towards the better. She is going to go back to Dr. dew for reevaluation to ensure her blood flow is still appropriate. That will be before her next evaluation with [place] next week. No fevers, chills, nausea, or vomiting noted at this time. Patient does have some discomfort rated to be a 3-4/10 depending on activity specifically cleansing the wound makes it  worse. 10/05/2017 -- the patient was seen by Dr. Lucky Cowboy last week and noninvasive studies showed a normal right ABI with brisk SOLYANA, NONAKA. ([id number]) triphasic waveforms consistent with no arterial insufficiency including normal digital pressures. The duplex showed a patent distal right SFA stent and the proximal SFA was also normal. He was pleased with her test and thought she should have enough of perfusion for normal wound healing. He would see her back in 6 months time. 12/21/17 on evaluation today patient appears to be doing fairly well in regard to her right lateral ankle wound. Unfortunately the main issue that she is expansion at this point is that she is having some issues with what appears to be some cellulitis in the right anterior shin. She has also been noting a little bit of uncomfortable feeling especially last night and her ankle area. I'm afraid that she made the developing a little bit of an infection. With that being said I think it is in the early stages. 12/28/17 on evaluation today patient's ankle appears to be doing excellent. She's making good progress at this point the cellulitis seems to have improved after last week's evaluation. Overall she is having no significant discomfort which is excellent news. She does have an appointment with Dr. dew on March 29, 2018 for reevaluation in regard to the stent he placed. She seems to have excellent blood flow in the right lower extremity. 01/19/12 on evaluation today patient's wound appears to be doing very well. In fact she does not appear to require debridement at this point, there's no evidence of infection, and overall from the standpoint of the wound she seems to be doing very well. With that being said I believe that it may be time to switch to different dressing away from the [facility] Dressing she tells me she does have a lot going on her friend actually passed away yesterday and she's also having a lot of issues  with her husband this obviously is weighing heavy on her as far as your thoughts and concerns today. 01/25/18 on evaluation today patient appears to be doing fairly well in regard to her right lateral malleolus. She has been tolerating the dressing changes without complication. Overall I feel like this is definitely showing signs of improvement as far as how the overall appearance of the wound is there's also evidence of epithelium start to migrate over the granulation tissue. In general I think that she is progressing nicely as far as the wound is concerned. The only concern she really has is whether or not we can switch to every other week visits in order to avoid having as many appointments as her daughters have a difficult time getting her to her appointments as well as the patient's husband to his he is not doing very well at this point. 02/22/18 on evaluation today patient's right lateral malleolus ulcer appears to be doing great. She has been tolerating the dressing changes without complication. Overall you making excellent progress at this time. Patient is having no significant discomfort. 03/15/18 on evaluation today patient appears to be doing much more poorly in regard to her right lateral ankle ulcer at this point. Unfortunately since have last seen her her husband has passed just a few days ago is obviously weighed heavily on her her daughter also had surgery well she is with her today as usual. There does not appear to be any evidence of infection she does seem to have significant contusion/deep tissue injury to the right lateral malleolus which was not noted previous when I saw her last. It's hard to tell of exactly when this injury occurred although during the time she was spending the night in the hospital this may have been most likely. 03/22/18 on evaluation today patient appears to actually be doing very well in regard to her ulcer. She did unfortunately have a setback which was  noted last week however the good news is we seem to be getting back on track and in fact the wound in the core did still have some necrotic tissue which will be addressed at this point today but in general I'm seeing signs that things are on the up and up. She is glad to hear this obviously she's been somewhat concerned that due to the how her wound digressed more recently. 03/29/18 on evaluation today patient appears to be doing fairly well in regard to her right lower extremity lateral malleolus ulcer. She unfortunately does have a new area of pressure injury over the inferior portion where the wound has opened up a little bit larger secondary to the pressure she seems to be getting. She does tell me sometimes when she sleeps at night that it actually hurts and does seem to be pushing on the area little bit more unfortunately.  There does not appear to be any evidence of infection which is good news. She has been tolerating the dressing changes without complication. She also did have some bruising in the left second and third toes due to the fact that she may have bump this or injured it although she has neuropathy so she does not feel she did move recently that may have been where this came from. Nonetheless there does not appear to be any evidence of infection at this time. 04/12/18 on evaluation today patient's wound on the right lateral ankle actually appears to be doing a little bit better with a lot of necrotic docking tissue centrally loosening up in clearing away. However she does have the beginnings of a deep tissue injury on the left lateral malleolus likely due to the fact we've been trying offload the right as much as we have. I think she may benefit from an assistive soft device to help with offloading and it looks like they're looking at one of the doughnut conditions that wraps around the lower leg to offload which I think will definitely do a good job. With that being said I think we  definitely need to address this issue on the left before it becomes a wound. Patient is not having significant pain. [name], [name] ([id number]) 04/19/18 on evaluation today patient appears to be doing excellent in regard to the progress she's made with her right lateral ankle ulcer. The left ankle region which did show evidence of a deep tissue injury seems to be resolving there's little fluid noted underneath and a blister there's nothing open at this point in time overall I feel like this is progressing nicely which is good news. She does not seem to be having significant discomfort at this point which is also good news. 04/25/18-She is here in follow up evaluation for bilateral lateral malleolar ulcers. The right lateral malleolus ulcer with pale subcutaneous tissue exposure, central area of ulcer with tendon/periosteum exposed. The left lateral malleolus ulcer now with central area of nonviable tissue, otherwise deep tissue injury. She is wearing compression wraps to the left lower extremity, she will place the right lower extremity compression wraps on when she gets home. She will be out of town over the weekend and return next week and follow-up appointment. She completed her doxycycline this morning 05/03/18 on evaluation today patient appears to be doing very well in regard to her right lateral ankle ulcer in general. At least she's showing some signs of improvement in this regard. Unfortunately she has some additional injury to the left lateral malleolus region which appears to be new likely even over the past several days. Again this determination is based on the overall appearance. With that being said the patient is obviously frustrated about this currently. 05/10/18-She is here in follow-up evaluation for bilateral lateral malleolar ulcers. She states she has purchased offloading shoes/boots and they will arrive tomorrow. She was asked to bring them in the office at next week's  appointment so her provider is aware of product being utilized. She continues to sleep on right or left side, she has been encouraged to sleep on her back. The right lateral malleolus ulcer is precariously close to peri-osteum; will order xray. The left lateral malleolus ulcer is improved. Will switch back to santyl; she will follow up next week. 05/17/18 on evaluation today patient actually appears to be doing very well in regard to her malleolus her ulcers compared to last time I saw them. She does  not seem to have as much in the way of contusion at this point which is great news. With that being said she does continue to have discomfort and I do believe that she is still continuing to benefit from the offloading/pressure reducing boots that were recommended. I think this is the key to trying to get this to heal up completely. 05/24/18 on evaluation today patient actually appears to be doing worse at this point in time unfortunately compared to her last week's evaluation. She is having really no increased pain which is good news unfortunately she does have more maceration in your theme and noted surrounding the right lateral ankle the left lateral ankle is not really is erythematous I do not see signs of the overt cellulitis on that side. Unfortunately the wounds do not seem to have shown any signs of improvement since the last evaluation. She also has significant swelling especially on the right compared to previous some of this may be due to infection however also think that she may be served better while she has these wounds by compression wrapping versus continuing to use the Juxta-Lite for the time being. Especially with the amount of drainage that she is experiencing at this point. No fevers, chills, nausea, or vomiting noted at this time. 05/31/18 on evaluation today patient appears to actually be doing better in regard to her right lateral lower extremity ulcer specifically on the malleolus  region. She has been tolerating the antibiotic without complication. With that being said she still continues to have issues but a little bit of redness although nothing like she what she was experiencing previous. She still continues to pressure to her ankle area she did get the problem on offloading boots unfortunately she will not wear them she states there too uncomfortable and she can't get in and out of the bed. Nonetheless at this point her wounds seem to be continually getting worse which is not what we want I'm getting somewhat concerned about her progress and how things are going to proceed if we do not intervene in some way shape or form. I therefore had a very lengthy conversation today about offloading yet again and even made a specific suggestion for switching her to a memory foam mattress and even gave the information for a specific one that they could look at getting if it was something that they were interested in considering. She does not want to be considered for a hospital bed air mattress although honestly insurance would not cover it that she does not have any wounds on her trunk. Patient History Information obtained from Patient. Family History Cancer - Father,Siblings, Heart Disease - Siblings, No family history of Diabetes, Hereditary Spherocytosis, Hypertension, Kidney Disease, Lung Disease, Seizures, Stroke, Thyroid Problems, Tuberculosis. Social History Never smoker, Marital Status - Married, Alcohol Use - Never, Drug Use - No History, Caffeine Use - Rarely. Review of Systems (ROS) Constitutional Symptoms (General Health) Keryl, [place] [name] ([id number]) Denies complaints or symptoms of Fever, Chills. Respiratory The patient has no complaints or symptoms. Cardiovascular Complains or has symptoms of LE edema. Psychiatric The patient has no complaints or symptoms. Objective Constitutional Well-nourished and well-hydrated in no acute distress. Vitals Time Taken:  12:30 PM, Height: 65 in, Weight: 154.3 lbs, BMI: 25.7, Temperature: 98.3 F, Pulse: 90 bpm, Respiratory Rate: 16 breaths/min, Blood Pressure: 110/70 mmHg. Respiratory normal breathing without difficulty. clear to auscultation bilaterally. Cardiovascular regular rate and rhythm with normal S1, S2. trace pitting edema of the bilateral lower extremities.  Psychiatric this patient is able to make decisions and demonstrates good insight into disease process. Alert and Oriented x 3. pleasant and cooperative. General Notes: Patient's wounds today actually show signs of improvement as far as the infection is concerned unfortunately she still continues to be getting pressure to the lateral malleolus region which is causing the wounds cannot be that heal and in the case of the left ulcer it's actually getting worse. There does not appear to be any evidence of active this can infection this is definitely resolving. Integumentary (Hair, Skin) Wound #1 status is Open. Original cause of wound was Gradually Appeared. The wound is located on the Right,Lateral Malleolus. The wound measures 2cm length x 2cm width x 0.4cm depth; 3.142cm^2 area and 1.257cm^3 volume. There is Fat Layer (Subcutaneous Tissue) Exposed exposed. There is no tunneling or undermining noted. There is a large amount of purulent drainage noted. Foul odor after cleansing was noted. The wound margin is distinct with the outline attached to the wound base. There is no granulation within the wound bed. There is a large (67-100%) amount of necrotic tissue within the wound bed including Eschar and Adherent Slough. The periwound skin appearance exhibited: Dry/Scaly, Maceration, Rubor, Erythema. The periwound skin appearance did not exhibit: Callus, Crepitus, Excoriation, Induration, Rash, Scarring, Atrophie Blanche, Cyanosis, Ecchymosis, Hemosiderin Staining, Mottled, Pallor. The surrounding wound skin color is noted with erythema which is  circumferential. Periwound temperature was noted as No Abnormality. The periwound has tenderness on palpation. Wound #2 status is Open. Original cause of wound was Gradually Appeared. The wound is located on the Left,Lateral Malleolus. The wound measures 1.6cm length x 1.3cm width x 0.2cm depth; 1.634cm^2 area and 0.327cm^3 volume. There is Fat Layer (Subcutaneous Tissue) Exposed exposed. There is no tunneling or undermining noted. There is a medium amount of purulent drainage noted. The wound margin is flat and intact. There is no granulation within the wound bed. There is a large (67-100%) amount of necrotic tissue within the wound bed including Adherent Slough. The periwound skin appearance exhibited: Dry/Scaly, Maceration, Erythema. The periwound skin appearance did not exhibit: Callus, Crepitus, [place], [name] ([id number]) Excoriation, Induration, Rash, Scarring, Atrophie Blanche, Cyanosis, Ecchymosis, Hemosiderin Staining, Mottled, Pallor, Rubor. The surrounding wound skin color is noted with erythema which is circumferential. Periwound temperature was noted as No Abnormality. 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 Cleanse wound with mild soap and water May Shower, gently pat wound dry prior to applying new dressing. Wound #2 Left,Lateral Malleolus: Clean wound with Normal Saline. Cleanse wound with mild soap and water May Shower, gently pat wound dry prior to applying new dressing. Anesthetic (add to Medication List): Wound #1 Right,Lateral Malleolus: Topical Lidocaine 4% cream applied to wound bed prior to debridement (In Clinic Only). Wound #2 Left,Lateral Malleolus: Topical Lidocaine 4%  cream applied to wound bed prior to debridement (In Clinic Only). Primary Wound Dressing: Wound #1 Right,Lateral Malleolus: Silver Alginate Wound #2 Left,Lateral Malleolus: Silver Alginate Secondary Dressing: Wound #1 Right,Lateral Malleolus: ABD pad XtraSorb Wound #2 Left,Lateral Malleolus: ABD pad XtraSorb Dressing Change Frequency: Wound #1 Right,Lateral Malleolus: Change Dressing Monday, Wednesday, Friday Wound #2 Left,Lateral Malleolus: Change Dressing Monday, Wednesday, Friday Follow-up Appointments: Wound #1 Right,Lateral Malleolus: [name], [name] ([id number]) Return Appointment in 1 week.  Wound #2 Left,Lateral Malleolus: Return Appointment in 1 week. Edema Control: Wound #1 Right,Lateral Malleolus: 3 Layer Compression System - Bilateral Elevate legs to the level of the heart and pump ankles as often as possible Wound #2 Left,Lateral Malleolus: 3 Layer Compression System - Bilateral Elevate legs to the level of the heart and pump ankles as often as possible Off-Loading: Wound #1 Right,Lateral Malleolus: Turn and reposition every 2 hours Other: - do not put pressure on your right ankle Wound #2 Left,Lateral Malleolus: Turn and reposition every 2 hours Other: - do not put pressure on your right ankle Additional Orders / Instructions: Wound #1 Right,Lateral Malleolus: Increase protein intake. Other: - Add Vitamin C, Zinc, Vitamin A to your diet Wound #2 Left,Lateral Malleolus: Increase protein intake. Other: - Add Vitamin C, Zinc, Vitamin A to your diet Home Health: Wound #1 Right,Lateral Malleolus: [place] Nurse may visit PRN to address patient s wound care needs. FACE TO FACE ENCOUNTER: MEDICARE and MEDICAID PATIENTS: I certify that this patient is under my care and that I had a face-to-face encounter that meets the physician face-to-face encounter requirements with this patient on this date. The encounter with the patient  was in whole or in part for the following MEDICAL CONDITION: (primary reason for [place]) MEDICAL NECESSITY: I certify, that based on my findings, NURSING services are a medically necessary home health service. HOME BOUND STATUS: I certify that my clinical findings support that this patient is homebound (i.e., Due to illness or injury, pt requires aid of supportive devices such as crutches, cane, wheelchairs, walkers, the use of special transportation or the assistance of another person to leave their place of residence. There is a normal inability to leave the home and doing so requires considerable and taxing effort. Other absences are for medical reasons / religious services and are infrequent or of short duration when for other reasons). If current dressing causes regression in wound condition, may D/C ordered dressing product/s and apply Normal Saline Moist Dressing daily until next [place] / Other MD appointment. [place] of regression in wound condition at [phone]. Please direct any NON-WOUND related issues/requests for orders to patient's Primary Care Physician Wound #2 Left,Lateral Malleolus: [place] Nurse may visit PRN to address patient s wound care needs. FACE TO FACE ENCOUNTER: MEDICARE and MEDICAID PATIENTS: I certify that this patient is under my care and that I had a face-to-face encounter that meets the physician face-to-face encounter requirements with this patient on this date. The encounter with the patient was in whole or in part for the following MEDICAL CONDITION: (primary reason for [place]) MEDICAL NECESSITY: I certify, that based on my findings, NURSING services are a medically necessary home health service. HOME BOUND STATUS: I certify that my clinical findings support that this patient is homebound (i.e., Due to illness or injury, pt requires aid of supportive devices such as crutches,  cane, wheelchairs, walkers, the use of special transportation or the assistance of another person to leave their place of residence. There is a normal inability to leave the home and doing so requires considerable and taxing effort. Other absences are for medical reasons / religious services and are infrequent or of short duration when for other reasons). If current dressing causes regression in wound condition, may D/C ordered dressing product/s and apply Normal Saline Moist Dressing daily until next [name] / Other MD appointment. [place]  of regression in wound condition at [phone]. Please direct any NON-WOUND related issues/requests for orders to patient's Primary Care Physician The following medication(s) was prescribed: Cipro oral 500 mg tablet 1 1 tablet oral taken 2 times a day for 10 days to extend the current regimen starting 05/31/2018 [name], [name] ([id number]) I am going to suggest that we actually continue with the above wound care orders for the next week and I did actually sit in another prescription for the antibiotic to be extended for another 10 days for the patient. Patient and her daughter were in agreement with plan. There also appears to be very supportive switch of her time in refill mattress which I think will also be appropriate. We will see were things stand at follow-up. Please see above for specific wound care orders. We will see patient for re-evaluation in 1 week(s) here in the clinic. If anything worsens or changes patient will contact our office for additional recommendations. Electronic Signature(s) Signed: 06/01/2018 12:34:34 PM By: Worthy Keeler PA-C Entered By: Worthy Keeler on 06/01/2018 11:58:24 [name], [name] ([id number]) -------------------------------------------------------------------------------- ROS/PFSH Details Patient Name: [name] Date of Service: 05/31/2018 12:30 PM Medical Record  Number: [id number] Patient Account Number: [id number] Date of Birth/Sex: [birth year] (84 y.o. F) Treating RN: Montey Hora Primary Care Provider: FITZGERALD, DAVID Other Clinician: Referring Provider: FITZGERALD, DAVID Treating Provider/Extender: STONE III, HOYT Weeks in Treatment: 76 Information Obtained From Patient Wound History Do you currently have one or more open woundso Yes How many open wounds do you currently haveo 1 Approximately how long have you had your woundso 2 yrs How have you been treating your wound(s) until nowo mupirocin, soaking in epsom salt Has your wound(s) ever healed and then re-openedo No Have you had any lab work done in the past montho No Have you tested positive for an antibiotic resistant organism (MRSA, VRE)o No Have you tested positive for osteomyelitis (bone infection)o No Have you had any tests for circulation on your legso Yes Who ordered the testo Dr. Lucky Cowboy [facility] test [facility] Constitutional Symptoms (General Health) Complaints and Symptoms: Negative for: Fever; Chills Cardiovascular Complaints and Symptoms: Positive for: LE edema Medical History: Positive for: Congestive Heart Failure; Hypertension Eyes Medical History: Positive for: Cataracts - surgery Respiratory Complaints and Symptoms: No Complaints or Symptoms Musculoskeletal Medical History: Positive for: Osteoarthritis Neurologic Medical History: Positive for: Neuropathy Oncologic AMIYA, ESCAMILLA ([id number]) Medical History: Negative for: Received Chemotherapy; Received Radiation Psychiatric Complaints and Symptoms: No Complaints or Symptoms HBO Extended History Items Eyes: Cataracts Immunizations Pneumococcal Vaccine: Received Pneumococcal Vaccination: Yes Implantable Devices Family and Social History Cancer: Yes - Father,Siblings; Diabetes: No; Heart Disease: Yes - Siblings; Hereditary Spherocytosis: No; Hypertension: No; Kidney Disease: No; Lung  Disease: No; Seizures: No; Stroke: No; Thyroid Problems: No; Tuberculosis: No; Never smoker; Marital Status - Married; Alcohol Use: Never; Drug Use: No History; Caffeine Use: Rarely; Financial Concerns: No; Food, Clothing or Shelter Needs: No; Support System Lacking: No; Transportation Concerns: No; Advanced Directives: No; Patient does not want information on Advanced Directives; Do not resuscitate: No; Living Will: Yes (Not Provided); Medical Power of Attorney: No Physician Affirmation I have reviewed and agree with the above information. Electronic Signature(s) Signed: 06/01/2018 12:34:34 PM By: Worthy Keeler PA-C Signed: 06/03/2018 5:27:38 PM By: Montey Hora Entered By: Worthy Keeler on 06/01/2018 11:57:22 [name], [name] ([id number]) -------------------------------------------------------------------------------- SuperBill Details Patient Name: [name] Date of Service: 05/31/2018 Medical Record Number: [id number] Patient Account Number:  [id number] Date of Birth/Sex: 23-Feb-1923 (84 y.o. 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

## 2018-06-14 ENCOUNTER — Encounter: Payer: Medicare Other | Attending: Physician Assistant | Admitting: Physician Assistant

## 2018-06-14 DIAGNOSIS — Z85828 Personal history of other malignant neoplasm of skin: Secondary | ICD-10-CM | POA: Insufficient documentation

## 2018-06-14 DIAGNOSIS — L89523 Pressure ulcer of left ankle, stage 3: Secondary | ICD-10-CM | POA: Insufficient documentation

## 2018-06-14 DIAGNOSIS — Z950 Presence of cardiac pacemaker: Secondary | ICD-10-CM | POA: Insufficient documentation

## 2018-06-14 DIAGNOSIS — L89514 Pressure ulcer of right ankle, stage 4: Secondary | ICD-10-CM | POA: Diagnosis present

## 2018-06-14 DIAGNOSIS — I890 Lymphedema, not elsewhere classified: Secondary | ICD-10-CM | POA: Diagnosis not present

## 2018-06-14 DIAGNOSIS — I70233 Atherosclerosis of native arteries of right leg with ulceration of ankle: Secondary | ICD-10-CM | POA: Insufficient documentation

## 2018-06-14 DIAGNOSIS — B354 Tinea corporis: Secondary | ICD-10-CM | POA: Insufficient documentation

## 2018-06-18 NOTE — Progress Notes (Signed)
[name], [name] ([id number]) Visit Report for 06/14/2018 Chief Complaint Document Details Patient Name: [name], [name]. Date of Service: 06/14/2018 9:30 AM Medical Record Number: [id number] Patient Account Number: [id number] Date of Birth/Sex: 1923/09/01 (84 y.o. F) Treating RN: Montey Hora Primary Care Provider: FITZGERALD, DAVID Other Clinician: Referring Provider: FITZGERALD, DAVID Treating Provider/Extender: STONE III, HOYT Weeks in Treatment: 29 Information Obtained from: Patient Chief Complaint Patient is here for right lateral malleolus and left lateral malleolus ulcer Electronic Signature(s) Signed: 06/16/2018 11:23:32 PM By: Worthy Keeler PA-C Entered By: Worthy Keeler on 06/14/2018 09:49:58 [name], [name] ([id number]) -------------------------------------------------------------------------------- Debridement Details Patient Name: [name] Date of Service: 06/14/2018 9:30 AM Medical Record Number: [id number] Patient Account Number: [id number] Date of Birth/Sex: May 21, 1923 (84 y.o. F) Treating RN: Montey Hora Primary Care Provider: FITZGERALD, DAVID Other Clinician: Referring Provider: FITZGERALD, DAVID Treating Provider/Extender: STONE III, HOYT Weeks in Treatment: 44 Debridement Performed for Wound #2 Left,Lateral Malleolus Assessment: Performed By: Physician STONE III, HOYT E., PA-C Debridement Type: Debridement Pre-procedure Verification/Time Yes - 09:56 Out Taken: Start Time: 09:56 Pain Control: Lidocaine 4% Topical Solution Total Area Debrided (L x W): 1.6 (cm) x 1.1 (cm) = 1.76 (cm) Tissue and other material Viable, Non-Viable, Slough, Subcutaneous, Fibrin/Exudate, Slough debrided: Level: Skin/Subcutaneous Tissue Debridement Description: Excisional Instrument: Curette Bleeding: Minimum Hemostasis Achieved: Pressure End Time: 09:59 Procedural Pain: 0 Post Procedural Pain: 0 Response to Treatment: Procedure was tolerated  well Level of Consciousness: Awake and Alert Post Debridement Measurements of Total Wound Length: (cm) 1.6 Stage: Category/Stage II Width: (cm) 1.1 Depth: (cm) 0.4 Volume: (cm) 0.553 Character of Wound/Ulcer Post Improved Debridement: Post Procedure Diagnosis Same as Pre-procedure Electronic Signature(s) Signed: 06/16/2018 11:23:32 PM By: Worthy Keeler PA-C Signed: 06/17/2018 5:53:18 PM By: Montey Hora Entered By: Montey Hora on 06/14/2018 09:59:36 [name], [name] ([id number]) -------------------------------------------------------------------------------- Debridement Details Patient Name: [name] Date of Service: 06/14/2018 9:30 AM Medical Record Number: [id number] Patient Account Number: [id number] Date of Birth/Sex: [birth year] (84 y.o. F) Treating RN: Montey Hora Primary Care Provider: FITZGERALD, DAVID Other Clinician: Referring Provider: FITZGERALD, DAVID Treating Provider/Extender: STONE III, HOYT Weeks in Treatment: 44 Debridement Performed for Wound #1 Right,Lateral Malleolus Assessment: Performed By: Physician STONE III, HOYT E., PA-C Debridement Type: Debridement Severity of Tissue Pre Fat layer exposed Debridement: Pre-procedure Verification/Time Yes - 09:59 Out Taken: Start Time: 09:59 Pain Control: Lidocaine 4% Topical Solution Total Area Debrided (L x W): 1.5 (cm) x 1.5 (cm) = 2.25 (cm) Tissue and other material Viable, Non-Viable, Slough, Subcutaneous, Fibrin/Exudate, Slough debrided: Level: Skin/Subcutaneous Tissue Debridement Description: Excisional Instrument: Curette Bleeding: Minimum Hemostasis Achieved: Pressure End Time: 10:02 Procedural Pain: 0 Post Procedural Pain: 0 Response to Treatment: Procedure was tolerated well Level of Consciousness: Awake and Alert Post Debridement Measurements of Total Wound Length: (cm) 1.5 Width: (cm) 1.5 Depth: (cm) 0.5 Volume: (cm) 0.884 Character of Wound/Ulcer Post  Debridement: Improved Severity of Tissue Post Debridement: Fat layer exposed Post Procedure Diagnosis Same as Pre-procedure Electronic Signature(s) Signed: 06/16/2018 11:23:32 PM By: Worthy Keeler PA-C Signed: 06/17/2018 5:53:18 PM By: Montey Hora Entered By: Montey Hora on 06/14/2018 10:00:26 [name], [name] ([id number]) -------------------------------------------------------------------------------- HPI Details Patient Name: [name] Date of Service: 06/14/2018 9:30 AM Medical Record Number: [id number] Patient Account Number: [id number] Date of Birth/Sex: 01/10/1923 (84 y.o. 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 Dr. Amalia Hailey the podiatrist saw her on 07/20/2017 and sent her to the wound center for possible hyperbaric oxygen therapy. past medical history of peripheral vascular disease, varicose veins, status post appendectomy, basal cell carcinoma excision from the left leg, cholecystectomy, pacemaker placement, right lower extremity angiography done by Dr. dew in March 2017 with placement of a stent. there is also note of a successful ablation of the right small saphenous vein done which was reviewed by ultrasound on 10/24/2016. the patient had a right small saphenous vein ablation done on 10/20/2016. The patient has never been a smoker. She has been seen by Dr. Corene Cornea dew the vascular surgeon who most recently saw her on 06/15/2017 for evaluation of ongoing problems with right leg swelling. She had a lower extremity arterial duplex examination done(02/13/17) which showed patent  distal right superficial femoral artery stent and above-the-knee popliteal stent without evidence of restenosis. The ABI was more than 1.3 on the right and more than 1.3 on the left. This was consistent with noncompressible arteries due to medial calcification. The right great toe pressure and PPG waveforms are within normal limits and the left great toe pressure and PPG waveforms are decreased. he recommended she continue to wear her compression stockings and continue with elevation. She is scheduled to have a noninvasive arterial study in the near future 08/16/2017 -- had a lower extremity arterial duplex examination done which showed patent distal right superficial femoral artery stent and above-the-knee popliteal stent without evidence of restenosis. The ABI was more than 1.3 on the right and more than 1.3 on the left. This was consistent with noncompressible arteries due to medial calcification. The right great toe pressure and PPG waveforms are within normal limits and the left great toe pressure and PPG waveforms are decreased. the x-ray of the right ankle has not yet been done 08/24/2017 -- had a right ankle x-ray -- IMPRESSION:1. No fracture, bone lesion or evidence of osteomyelitis. 2. Lateral soft tissue swelling with a soft tissue ulcer. she has not yet seen the vascular surgeon for review 08/31/17 on evaluation today patient's wound appears to be showing signs of improvement. She still with her appointment with vascular in order to review her results of her vascular study and then determine if any intervention would be recommended at that time. No fevers, chills, nausea, or vomiting noted at this time. She has been tolerating the dressing changes without complication. 09/28/17 on evaluation today patient's wound appears to show signs of good improvement in regard to the granulation tissue which is surfacing. There is still a layer of slough covering the wound and the posterior portion  is still significantly deeper than the anterior nonetheless there has been some good sign of things moving towards the better. She is going to go back to Dr. dew for reevaluation to ensure her blood flow is still appropriate. That will be before her next evaluation with [place] next week. No fevers, chills, nausea, or vomiting noted at this time. Patient does have some discomfort rated to be a 3-4/10 depending on activity specifically cleansing the wound makes it worse. 10/05/2017 -- the patient was seen by Dr. Lucky Cowboy last week and noninvasive studies showed a normal right ABI with brisk triphasic waveforms consistent with no arterial insufficiency including normal digital pressures. The duplex showed a patent distal right SFA  stent and the proximal SFA was also normal. He was pleased with her test and thought she should have enough of perfusion for normal wound healing. He would see her back in 6 months time. 12/21/17 on evaluation today patient appears to be doing fairly well in regard to her right lateral ankle wound. Unfortunately the main issue that she is expansion at this point is that she is having some issues with what appears to be some cellulitis in the ANNE, BOLTZ. ([id number]) right anterior shin. She has also been noting a little bit of uncomfortable feeling especially last night and her ankle area. I'm afraid that she made the developing a little bit of an infection. With that being said I think it is in the early stages. 12/28/17 on evaluation today patient's ankle appears to be doing excellent. She's making good progress at this point the cellulitis seems to have improved after last week's evaluation. Overall she is having no significant discomfort which is excellent news. She does have an appointment with Dr. dew on March 29, 2018 for reevaluation in regard to the stent he placed. She seems to have excellent blood flow in the right lower extremity. 01/19/12 on evaluation today  patient's wound appears to be doing very well. In fact she does not appear to require debridement at this point, there's no evidence of infection, and overall from the standpoint of the wound she seems to be doing very well. With that being said I believe that it may be time to switch to different dressing away from the [facility] Dressing she tells me she does have a lot going on her friend actually passed away yesterday and she's also having a lot of issues with her husband this obviously is weighing heavy on her as far as your thoughts and concerns today. 01/25/18 on evaluation today patient appears to be doing fairly well in regard to her right lateral malleolus. She has been tolerating the dressing changes without complication. Overall I feel like this is definitely showing signs of improvement as far as how the overall appearance of the wound is there's also evidence of epithelium start to migrate over the granulation tissue. In general I think that she is progressing nicely as far as the wound is concerned. The only concern she really has is whether or not we can switch to every other week visits in order to avoid having as many appointments as her daughters have a difficult time getting her to her appointments as well as the patient's husband to his he is not doing very well at this point. 02/22/18 on evaluation today patient's right lateral malleolus ulcer appears to be doing great. She has been tolerating the dressing changes without complication. Overall you making excellent progress at this time. Patient is having no significant discomfort. 03/15/18 on evaluation today patient appears to be doing much more poorly in regard to her right lateral ankle ulcer at this point. Unfortunately since have last seen her her husband has passed just a few days ago is obviously weighed heavily on her her daughter also had surgery well she is with her today as usual. There does not appear to be any  evidence of infection she does seem to have significant contusion/deep tissue injury to the right lateral malleolus which was not noted previous when I saw her last. It's hard to tell of exactly when this injury occurred although during the time she was spending the night in the hospital this may have been most  likely. 03/22/18 on evaluation today patient appears to actually be doing very well in regard to her ulcer. She did unfortunately have a setback which was noted last week however the good news is we seem to be getting back on track and in fact the wound in the core did still have some necrotic tissue which will be addressed at this point today but in general I'm seeing signs that things are on the up and up. She is glad to hear this obviously she's been somewhat concerned that due to the how her wound digressed more recently. 03/29/18 on evaluation today patient appears to be doing fairly well in regard to her right lower extremity lateral malleolus ulcer. She unfortunately does have a new area of pressure injury over the inferior portion where the wound has opened up a little bit larger secondary to the pressure she seems to be getting. She does tell me sometimes when she sleeps at night that it actually hurts and does seem to be pushing on the area little bit more unfortunately. There does not appear to be any evidence of infection which is good news. She has been tolerating the dressing changes without complication. She also did have some bruising in the left second and third toes due to the fact that she may have bump this or injured it although she has neuropathy so she does not feel she did move recently that may have been where this came from. Nonetheless there does not appear to be any evidence of infection at this time. 04/12/18 on evaluation today patient's wound on the right lateral ankle actually appears to be doing a little bit better with a lot of necrotic docking tissue centrally  loosening up in clearing away. However she does have the beginnings of a deep tissue injury on the left lateral malleolus likely due to the fact we've been trying offload the right as much as we have. I think she may benefit from an assistive soft device to help with offloading and it looks like they're looking at one of the doughnut conditions that wraps around the lower leg to offload which I think will definitely do a good job. With that being said I think we definitely need to address this issue on the left before it becomes a wound. Patient is not having significant pain. 04/19/18 on evaluation today patient appears to be doing excellent in regard to the progress she's made with her right lateral ankle ulcer. The left ankle region which did show evidence of a deep tissue injury seems to be resolving there's little fluid noted underneath and a blister there's nothing open at this point in time overall I feel like this is progressing nicely which is good news. She does not seem to be having significant discomfort at this point which is also good news. 04/25/18-She is here in follow up evaluation for bilateral lateral malleolar ulcers. The right lateral malleolus ulcer with pale subcutaneous tissue exposure, central area of ulcer with tendon/periosteum exposed. The left lateral malleolus ulcer now with Garno, [name] ([id number]) central area of nonviable tissue, otherwise deep tissue injury. She is wearing compression wraps to the left lower extremity, she will place the right lower extremity compression wraps on when she gets home. She will be out of town over the weekend and return next week and follow-up appointment. She completed her doxycycline this morning 05/03/18 on evaluation today patient appears to be doing very well in regard to her right lateral ankle ulcer in  general. At least she's showing some signs of improvement in this regard. Unfortunately she has some additional injury to the  left lateral malleolus region which appears to be new likely even over the past several days. Again this determination is based on the overall appearance. With that being said the patient is obviously frustrated about this currently. 05/10/18-She is here in follow-up evaluation for bilateral lateral malleolar ulcers. She states she has purchased offloading shoes/boots and they will arrive tomorrow. She was asked to bring them in the office at next week's appointment so her provider is aware of product being utilized. She continues to sleep on right or left side, she has been encouraged to sleep on her back. The right lateral malleolus ulcer is precariously close to peri-osteum; will order xray. The left lateral malleolus ulcer is improved. Will switch back to santyl; she will follow up next week. 05/17/18 on evaluation today patient actually appears to be doing very well in regard to her malleolus her ulcers compared to last time I saw them. She does not seem to have as much in the way of contusion at this point which is great news. With that being said she does continue to have discomfort and I do believe that she is still continuing to benefit from the offloading/pressure reducing boots that were recommended. I think this is the key to trying to get this to heal up completely. 05/24/18 on evaluation today patient actually appears to be doing worse at this point in time unfortunately compared to her last week's evaluation. She is having really no increased pain which is good news unfortunately she does have more maceration in your theme and noted surrounding the right lateral ankle the left lateral ankle is not really is erythematous I do not see signs of the overt cellulitis on that side. Unfortunately the wounds do not seem to have shown any signs of improvement since the last evaluation. She also has significant swelling especially on the right compared to previous some of this may be due  to infection however also think that she may be served better while she has these wounds by compression wrapping versus continuing to use the Juxta-Lite for the time being. Especially with the amount of drainage that she is experiencing at this point. No fevers, chills, nausea, or vomiting noted at this time. 05/31/18 on evaluation today patient appears to actually be doing better in regard to her right lateral lower extremity ulcer specifically on the malleolus region. She has been tolerating the antibiotic without complication. With that being said she still continues to have issues but a little bit of redness although nothing like she what she was experiencing previous. She still continues to pressure to her ankle area she did get the problem on offloading boots unfortunately she will not wear them she states there too uncomfortable and she can't get in and out of the bed. Nonetheless at this point her wounds seem to be continually getting worse which is not what we want I'm getting somewhat concerned about her progress and how things are going to proceed if we do not intervene in some way shape or form. I therefore had a very lengthy conversation today about offloading yet again and even made a specific suggestion for switching her to a memory foam mattress and even gave the information for a specific one that they could look at getting if it was something that they were interested in considering. She does not want to be considered for a  hospital bed air mattress although honestly insurance would not cover it that she does not have any wounds on her trunk. 06/14/18 on evaluation today both wounds over the bilateral lateral malleolus her ulcers appear to be doing better there's no evidence of pressure injury at this point. She did get the foam mattress for her bed and this does seem to have been extremely beneficial for her in my pinion. Her daughter states that she is having difficulty getting out  of bed because of how soft it is. The patient also relates this to be. Nonetheless I do feel like she's actually doing better. Unfortunately right after and around the time she was getting the mattress she also sustained a fall when she got up to go pick up the phone and ended up injuring her right elbow she has 18 sutures in place. We are not caring for this currently although home health is going to be taking the sutures out shortly. Nonetheless this may be something that we need to evaluate going forward. It depends on how well it has or has not healed in the end. She also recently saw an orthopedic specialist for an injection in the right shoulder just before her fall unfortunately the fall seems to have worsened her pain. Electronic Signature(s) Signed: 06/16/2018 11:23:32 PM By: Worthy Keeler PA-C Entered By: Worthy Keeler on 06/16/2018 23:04:02 [name] ([id number]) -------------------------------------------------------------------------------- Physical Exam Details Patient Name: [name] Date of Service: 06/14/2018 9:30 AM Medical Record Number: [id number] Patient Account Number: [id number] Date of Birth/Sex: January 22, 1923 (84 y.o. F) Treating RN: Montey Hora Primary Care Provider: FITZGERALD, DAVID Other Clinician: Referring Provider: FITZGERALD, DAVID Treating Provider/Extender: STONE III, HOYT Weeks in Treatment: 5 Constitutional Well-nourished and well-hydrated in no acute distress. Respiratory normal breathing without difficulty. clear to auscultation bilaterally. Cardiovascular regular rate and rhythm with normal S1, S2. trace pitting edema of the bilateral lower extremities. Psychiatric this patient is able to make decisions and demonstrates good insight into disease process. Alert and Oriented x 3. pleasant and cooperative. Notes In general patient's wounds both appear to be a little bit smaller there's no erythema surrounding as evidence of  pressure injury which is great news and in general I feel like she is doing much better. There still slough on the surface of the wound which I did work on sharply debrided today she tolerated this without complication. Electronic Signature(s) Signed: 06/16/2018 11:23:32 PM By: Worthy Keeler PA-C Entered By: Worthy Keeler on 06/16/2018 23:04:42 [name] ([id number]) -------------------------------------------------------------------------------- Physician Orders Details Patient Name: [name] Date of Service: 06/14/2018 9:30 AM Medical Record Number: [id number] Patient Account Number: [id number] Date of Birth/Sex: Aug 16, 1923 (84 y.o. F) Treating RN: Montey Hora Primary Care Provider: FITZGERALD, DAVID Other Clinician: Referring Provider: FITZGERALD, DAVID Treating Provider/Extender: STONE III, HOYT Weeks in Treatment: 48 Verbal / Phone Orders: No Diagnosis Coding ICD-10 Coding Code Description 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, stage 4 I70.233 Atherosclerosis of native arteries of right leg with ulceration of ankle I89.0 Lymphedema, not elsewhere classified B35.4 Tinea corporis L89.523 Pressure ulcer of left ankle, stage 3 Wound Cleansing Wound #1 Right,Lateral Malleolus o Clean wound with Normal Saline. o Cleanse wound with mild soap and water o May Shower, gently pat wound dry prior to applying new dressing. Wound #2 Left,Lateral Malleolus o Clean wound with Normal Saline. o Cleanse wound with mild soap and water o May Shower, gently pat wound dry prior to applying new dressing. Anesthetic (add  to Medication List) Wound #1 Right,Lateral Malleolus o Topical Lidocaine 4% cream applied to wound bed prior to debridement (In Clinic Only). Wound #2 Left,Lateral Malleolus o Topical Lidocaine 4% cream applied to wound bed prior to debridement (In Clinic Only). Primary Wound Dressing Wound #1 Right,Lateral Malleolus o Silver  Alginate Wound #2 Left,Lateral Malleolus o Silver Alginate Secondary Dressing Wound #1 Right,Lateral Malleolus o ABD pad o XtraSorb Wound #2 Left,Lateral Malleolus o ABD pad o RIOT, BARRICK ([id number]) Dressing Change Frequency Wound #1 Right,Lateral Malleolus o Change Dressing Monday, Wednesday, Friday Wound #2 Left,Lateral Malleolus o Change Dressing Monday, Wednesday, Friday Follow-up Appointments Wound #1 Right,Lateral Malleolus o Return Appointment in 1 week. Wound #2 Left,Lateral Malleolus o Return Appointment in 1 week. Edema Control Wound #1 Right,Lateral Malleolus o 3 Layer Compression System - Bilateral o Elevate legs to the level of the heart and pump ankles as often as possible Wound #2 Left,Lateral Malleolus o 3 Layer Compression System - Bilateral o Elevate legs to the level of the heart and pump ankles as often as possible Off-Loading Wound #1 Right,Lateral Malleolus o Turn and reposition every 2 hours o Other: - do not put pressure on your right ankle Wound #2 Left,Lateral Malleolus o Turn and reposition every 2 hours o Other: - do not put pressure on your right ankle Additional Orders / Instructions Wound #1 Right,Lateral Malleolus o Increase protein intake. o Other: - Add Vitamin C, Zinc, Vitamin A to your diet Wound #2 Left,Lateral Malleolus o Increase protein intake. o Other: - Add Vitamin C, Zinc, Vitamin A to your diet Home Health Wound #1 [place] Nurse may visit PRN to address patientos wound care needs. o FACE TO FACE ENCOUNTER: MEDICARE and MEDICAID PATIENTS: I certify that this patient is under my care and that I had a face-to-face encounter that meets the physician face-to-face encounter requirements with this patient on this date. The encounter with the patient was in whole or in part for the following  MEDICAL CONDITION: (primary reason for [place]) MEDICAL NECESSITY: I certify, that based on my findings, NURSING services are a medically necessary home health service. HOME BOUND STATUS: I certify that my clinical findings support that this patient is homebound (i.e., Due to illness or injury, pt requires aid of supportive devices such as crutches, cane, wheelchairs, walkers, the use of special transportation or the assistance of another person to leave their place of residence. There is a normal inability to leave the home and doing so requires considerable and taxing effort. Other absences are for medical reasons / religious services and are infrequent or of short duration when for other reasons). [name], [name] ([id number]) o If current dressing causes regression in wound condition, may D/C ordered dressing product/s and apply Normal Saline Moist Dressing daily until next [place] / Other MD appointment. [place] of regression in wound condition at [phone]. o Please direct any NON-WOUND related issues/requests for orders to patient's Primary Care Physician Wound #2 [place] Nurse may visit PRN to address patientos wound care needs. o FACE TO FACE ENCOUNTER: MEDICARE and MEDICAID PATIENTS: I certify that this patient is under my care and that I had a face-to-face encounter that meets the physician face-to-face encounter requirements with this patient on this date. The encounter with the patient was in whole or in part for the following MEDICAL CONDITION: (primary reason for  Home Healthcare) MEDICAL NECESSITY: I certify, that based on my findings, NURSING services are a medically necessary home health service. HOME BOUND STATUS: I certify that my clinical findings support that this patient is homebound (i.e., Due to illness or injury, pt requires aid of supportive devices such  as crutches, cane, wheelchairs, walkers, the use of special transportation or the assistance of another person to leave their place of residence. There is a normal inability to leave the home and doing so requires considerable and taxing effort. Other absences are for medical reasons / religious services and are infrequent or of short duration when for other reasons). o If current dressing causes regression in wound condition, may D/C ordered dressing product/s and apply Normal Saline Moist Dressing daily until next [place] / Other MD appointment. [place] of regression in wound condition at [phone]. o Please direct any NON-WOUND related issues/requests for orders to patient's Primary Care Physician Electronic Signature(s) Signed: 06/16/2018 11:23:32 PM By: Worthy Keeler PA-C Signed: 06/17/2018 5:53:18 PM By: Montey Hora Entered By: Montey Hora on 06/14/2018 10:05:37 Dumas, [name] ([id number]) -------------------------------------------------------------------------------- Problem List Details Patient Name: [name] Date of Service: 06/14/2018 9:30 AM Medical Record Number: [id number] Patient Account Number: [id number] Date of Birth/Sex: 11-23-1923 (84 y.o. 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, stage 4 05/10/2018 No Yes I70.233 Atherosclerosis of native arteries of right leg with ulceration of 08/10/2017 No Yes ankle I89.0 Lymphedema, not elsewhere classified 08/10/2017 No Yes B35.4 Tinea corporis 09/28/2017 No Yes L89.523 Pressure ulcer of left ankle, stage 3 04/25/2018 No Yes Inactive Problems Resolved Problems Electronic Signature(s) Signed:  06/16/2018 11:23:32 PM By: Worthy Keeler PA-C Entered By: Worthy Keeler on 06/14/2018 09:49:52 [name], [name] ([id number]) -------------------------------------------------------------------------------- Progress Note Details Patient Name: [name] Date of Service: 06/14/2018 9:30 AM Medical Record Number: [id number] Patient Account Number: [id number] Date of Birth/Sex: Oct 15, 1923 (84 y.o. 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 Dr. Amalia Hailey the podiatrist saw her on 07/20/2017 and sent her to the wound center for possible hyperbaric oxygen therapy. past medical history of peripheral vascular disease, varicose veins, status post appendectomy, basal cell carcinoma excision from the left leg, cholecystectomy, pacemaker placement, right lower extremity angiography done by Dr. dew in March 2017 with placement of a stent. there is also note of a successful ablation of the right small saphenous vein done which was reviewed by ultrasound on 10/24/2016. the patient had a right small saphenous vein ablation done on 10/20/2016. The patient has never been a smoker. She has been seen by Dr. Corene Cornea dew the vascular surgeon who most recently saw her on 06/15/2017 for evaluation of ongoing problems with right leg swelling. She had a lower extremity arterial duplex examination done(02/13/17) which showed patent distal right superficial femoral artery stent and above-the-knee  popliteal stent without evidence of restenosis. The ABI was  more than 1.3 on the right and more than 1.3 on the left. This was consistent with noncompressible arteries due to medial calcification. The right great toe pressure and PPG waveforms are within normal limits and the left great toe pressure and PPG waveforms are decreased. he recommended she continue to wear her compression stockings and continue with elevation. She is scheduled to have a noninvasive arterial study in the near future 08/16/2017 -- had a lower extremity arterial duplex examination done which showed patent distal right superficial femoral artery stent and above-the-knee popliteal stent without evidence of restenosis. The ABI was more than 1.3 on the right and more than 1.3 on the left. This was consistent with noncompressible arteries due to medial calcification. The right great toe pressure and PPG waveforms are within normal limits and the left great toe pressure and PPG waveforms are decreased. the x-ray of the right ankle has not yet been done 08/24/2017 -- had a right ankle x-ray -- IMPRESSION:1. No fracture, bone lesion or evidence of osteomyelitis. 2. Lateral soft tissue swelling with a soft tissue ulcer. she has not yet seen the vascular surgeon for review 08/31/17 on evaluation today patient's wound appears to be showing signs of improvement. She still with her appointment with vascular in order to review her results of her vascular study and then determine if any intervention would be recommended at that time. No fevers, chills, nausea, or vomiting noted at this time. She has been tolerating the dressing changes without complication. 09/28/17 on evaluation today patient's wound appears to show signs of good improvement in regard to the granulation tissue which is surfacing. There is still a layer of slough covering the wound and the posterior portion is still significantly deeper than the anterior nonetheless  there has been some good sign of things moving towards the better. She is going to go back to Dr. dew for reevaluation to ensure her blood flow is still appropriate. That will be before her next evaluation with [place] next week. No fevers, chills, nausea, or vomiting noted at this time. Patient does have some discomfort rated to be a 3-4/10 depending on activity specifically cleansing the wound makes it worse. 10/05/2017 -- the patient was seen by Dr. Lucky Cowboy last week and noninvasive studies showed a normal right ABI with brisk MALISSIA, RABBANI. ([id number]) triphasic waveforms consistent with no arterial insufficiency including normal digital pressures. The duplex showed a patent distal right SFA stent and the proximal SFA was also normal. He was pleased with her test and thought she should have enough of perfusion for normal wound healing. He would see her back in 6 months time. 12/21/17 on evaluation today patient appears to be doing fairly well in regard to her right lateral ankle wound. Unfortunately the main issue that she is expansion at this point is that she is having some issues with what appears to be some cellulitis in the right anterior shin. She has also been noting a little bit of uncomfortable feeling especially last night and her ankle area. I'm afraid that she made the developing a little bit of an infection. With that being said I think it is in the early stages. 12/28/17 on evaluation today patient's ankle appears to be doing excellent. She's making good progress at this point the cellulitis seems to have improved after last week's evaluation. Overall she is having no significant discomfort which is excellent news. She does have an appointment with Dr. dew on March 29, 2018 for reevaluation in regard  to the stent he placed. She seems to have excellent blood flow in the right lower extremity. 01/19/12 on evaluation today patient's wound appears to be doing very well. In fact she does not  appear to require debridement at this point, there's no evidence of infection, and overall from the standpoint of the wound she seems to be doing very well. With that being said I believe that it may be time to switch to different dressing away from the [facility] Dressing she tells me she does have a lot going on her friend actually passed away yesterday and she's also having a lot of issues with her husband this obviously is weighing heavy on her as far as your thoughts and concerns today. 01/25/18 on evaluation today patient appears to be doing fairly well in regard to her right lateral malleolus. She has been tolerating the dressing changes without complication. Overall I feel like this is definitely showing signs of improvement as far as how the overall appearance of the wound is there's also evidence of epithelium start to migrate over the granulation tissue. In general I think that she is progressing nicely as far as the wound is concerned. The only concern she really has is whether or not we can switch to every other week visits in order to avoid having as many appointments as her daughters have a difficult time getting her to her appointments as well as the patient's husband to his he is not doing very well at this point. 02/22/18 on evaluation today patient's right lateral malleolus ulcer appears to be doing great. She has been tolerating the dressing changes without complication. Overall you making excellent progress at this time. Patient is having no significant discomfort. 03/15/18 on evaluation today patient appears to be doing much more poorly in regard to her right lateral ankle ulcer at this point. Unfortunately since have last seen her her husband has passed just a few days ago is obviously weighed heavily on her her daughter also had surgery well she is with her today as usual. There does not appear to be any evidence of infection she does seem to have significant contusion/deep  tissue injury to the right lateral malleolus which was not noted previous when I saw her last. It's hard to tell of exactly when this injury occurred although during the time she was spending the night in the hospital this may have been most likely. 03/22/18 on evaluation today patient appears to actually be doing very well in regard to her ulcer. She did unfortunately have a setback which was noted last week however the good news is we seem to be getting back on track and in fact the wound in the core did still have some necrotic tissue which will be addressed at this point today but in general I'm seeing signs that things are on the up and up. She is glad to hear this obviously she's been somewhat concerned that due to the how her wound digressed more recently. 03/29/18 on evaluation today patient appears to be doing fairly well in regard to her right lower extremity lateral malleolus ulcer. She unfortunately does have a new area of pressure injury over the inferior portion where the wound has opened up a little bit larger secondary to the pressure she seems to be getting. She does tell me sometimes when she sleeps at night that it actually hurts and does seem to be pushing on the area little bit more unfortunately. There does not appear to be  any evidence of infection which is good news. She has been tolerating the dressing changes without complication. She also did have some bruising in the left second and third toes due to the fact that she may have bump this or injured it although she has neuropathy so she does not feel she did move recently that may have been where this came from. Nonetheless there does not appear to be any evidence of infection at this time. 04/12/18 on evaluation today patient's wound on the right lateral ankle actually appears to be doing a little bit better with a lot of necrotic docking tissue centrally loosening up in clearing away. However she does have the beginnings of  a deep tissue injury on the left lateral malleolus likely due to the fact we've been trying offload the right as much as we have. I think she may benefit from an assistive soft device to help with offloading and it looks like they're looking at one of the doughnut conditions that wraps around the lower leg to offload which I think will definitely do a good job. With that being said I think we definitely need to address this issue on the left before it becomes a wound. Patient is not having significant pain. [name], [name] ([id number]) 04/19/18 on evaluation today patient appears to be doing excellent in regard to the progress she's made with her right lateral ankle ulcer. The left ankle region which did show evidence of a deep tissue injury seems to be resolving there's little fluid noted underneath and a blister there's nothing open at this point in time overall I feel like this is progressing nicely which is good news. She does not seem to be having significant discomfort at this point which is also good news. 04/25/18-She is here in follow up evaluation for bilateral lateral malleolar ulcers. The right lateral malleolus ulcer with pale subcutaneous tissue exposure, central area of ulcer with tendon/periosteum exposed. The left lateral malleolus ulcer now with central area of nonviable tissue, otherwise deep tissue injury. She is wearing compression wraps to the left lower extremity, she will place the right lower extremity compression wraps on when she gets home. She will be out of town over the weekend and return next week and follow-up appointment. She completed her doxycycline this morning 05/03/18 on evaluation today patient appears to be doing very well in regard to her right lateral ankle ulcer in general. At least she's showing some signs of improvement in this regard. Unfortunately she has some additional injury to the left lateral malleolus region which appears to be new likely even  over the past several days. Again this determination is based on the overall appearance. With that being said the patient is obviously frustrated about this currently. 05/10/18-She is here in follow-up evaluation for bilateral lateral malleolar ulcers. She states she has purchased offloading shoes/boots and they will arrive tomorrow. She was asked to bring them in the office at next week's appointment so her provider is aware of product being utilized. She continues to sleep on right or left side, she has been encouraged to sleep on her back. The right lateral malleolus ulcer is precariously close to peri-osteum; will order xray. The left lateral malleolus ulcer is improved. Will switch back to santyl; she will follow up next week. 05/17/18 on evaluation today patient actually appears to be doing very well in regard to her malleolus her ulcers compared to last time I saw them. She does not seem to have as much  in the way of contusion at this point which is great news. With that being said she does continue to have discomfort and I do believe that she is still continuing to benefit from the offloading/pressure reducing boots that were recommended. I think this is the key to trying to get this to heal up completely. 05/24/18 on evaluation today patient actually appears to be doing worse at this point in time unfortunately compared to her last week's evaluation. She is having really no increased pain which is good news unfortunately she does have more maceration in your theme and noted surrounding the right lateral ankle the left lateral ankle is not really is erythematous I do not see signs of the overt cellulitis on that side. Unfortunately the wounds do not seem to have shown any signs of improvement since the last evaluation. She also has significant swelling especially on the right compared to previous some of this may be due to infection however also think that she may be served better while she has  these wounds by compression wrapping versus continuing to use the Juxta-Lite for the time being. Especially with the amount of drainage that she is experiencing at this point. No fevers, chills, nausea, or vomiting noted at this time. 05/31/18 on evaluation today patient appears to actually be doing better in regard to her right lateral lower extremity ulcer specifically on the malleolus region. She has been tolerating the antibiotic without complication. With that being said she still continues to have issues but a little bit of redness although nothing like she what she was experiencing previous. She still continues to pressure to her ankle area she did get the problem on offloading boots unfortunately she will not wear them she states there too uncomfortable and she can't get in and out of the bed. Nonetheless at this point her wounds seem to be continually getting worse which is not what we want I'm getting somewhat concerned about her progress and how things are going to proceed if we do not intervene in some way shape or form. I therefore had a very lengthy conversation today about offloading yet again and even made a specific suggestion for switching her to a memory foam mattress and even gave the information for a specific one that they could look at getting if it was something that they were interested in considering. She does not want to be considered for a hospital bed air mattress although honestly insurance would not cover it that she does not have any wounds on her trunk. 06/14/18 on evaluation today both wounds over the bilateral lateral malleolus her ulcers appear to be doing better there's no evidence of pressure injury at this point. She did get the foam mattress for her bed and this does seem to have been extremely beneficial for her in my pinion. Her daughter states that she is having difficulty getting out of bed because of how soft it is. The patient also relates this to be.  Nonetheless I do feel like she's actually doing better. Unfortunately right after and around the time she was getting the mattress she also sustained a fall when she got up to go pick up the phone and ended up injuring her right elbow she has 18 sutures in place. We are not caring for this currently although home health is going to be taking the sutures out shortly. Nonetheless this may be something that we need to evaluate going forward. It depends on how well it has or has  not healed in the end. She also recently saw an orthopedic specialist for an injection in the right shoulder just before her fall unfortunately the fall seems to have worsened her pain. Patient History Information obtained from Patient. [name], [name] ([id number]) Family History Cancer - Father,Siblings, Heart Disease - Siblings, No family history of Diabetes, Hereditary Spherocytosis, Hypertension, Kidney Disease, Lung Disease, Seizures, Stroke, Thyroid Problems, Tuberculosis. Social History Never smoker, Marital Status - Married, Alcohol Use - Never, Drug Use - No History, Caffeine Use - Rarely. Review of Systems (ROS) Constitutional Symptoms (General Health) Denies complaints or symptoms of Fever, Chills. Respiratory The patient has no complaints or symptoms. Cardiovascular The patient has no complaints or symptoms. Psychiatric The patient has no complaints or symptoms. Objective Constitutional Well-nourished and well-hydrated in no acute distress. Vitals Time Taken: 9:20 AM, Height: 65 in, Weight: 154.3 lbs, BMI: 25.7, Temperature: 98.5 F, Pulse: 87 bpm, Respiratory Rate: 18 breaths/min, Blood Pressure: 135/62 mmHg. Respiratory normal breathing without difficulty. clear to auscultation bilaterally. Cardiovascular regular rate and rhythm with normal S1, S2. trace pitting edema of the bilateral lower extremities. Psychiatric this patient is able to make decisions and demonstrates good insight into  disease process. Alert and Oriented x 3. pleasant and cooperative. General Notes: In general patient's wounds both appear to be a little bit smaller there's no erythema surrounding as evidence of pressure injury which is great news and in general I feel like she is doing much better. There still slough on the surface of the wound which I did work on sharply debrided today she tolerated this without complication. Integumentary (Hair, Skin) Wound #1 status is Open. Original cause of wound was Gradually Appeared. The wound is located on the Right,Lateral Malleolus. The wound measures 1.5cm length x 1.5cm width x 0.4cm depth; 1.767cm^2 area and 0.707cm^3 volume. There is Fat Layer (Subcutaneous Tissue) Exposed exposed. There is no tunneling or undermining noted. There is a large amount of purulent drainage noted. The wound margin is distinct with the outline attached to the wound base. There is no granulation within the wound bed. There is a large (67-100%) amount of necrotic tissue within the wound bed including Eschar and Adherent Slough. The periwound skin appearance exhibited: Dry/Scaly, Maceration, Rubor, Erythema. The periwound skin appearance did not exhibit: Callus, Crepitus, Excoriation, Induration, Rash, Scarring, Atrophie Blanche, Cyanosis, Ecchymosis, Hemosiderin Staining, Mottled, Pallor. The surrounding wound skin color is noted with erythema which is [place], [name] ([id number]) circumferential. Periwound temperature was noted as No Abnormality. The periwound has tenderness on palpation. Wound #2 status is Open. Original cause of wound was Gradually Appeared. The wound is located on the Left,Lateral Malleolus. The wound measures 1.6cm length x 1.1cm width x 0.3cm depth; 1.382cm^2 area and 0.415cm^3 volume. There is Fat Layer (Subcutaneous Tissue) Exposed exposed. There is no tunneling or undermining noted. There is a medium amount of purulent drainage noted. The wound margin is flat  and intact. There is no granulation within the wound bed. There is a large (67-100%) amount of necrotic tissue within the wound bed including Adherent Slough. The periwound skin appearance exhibited: Dry/Scaly, Maceration, Erythema. The periwound skin appearance did not exhibit: Callus, Crepitus, Excoriation, Induration, Rash, Scarring, Atrophie Blanche, Cyanosis, Ecchymosis, Hemosiderin Staining, Mottled, Pallor, Rubor. The surrounding wound skin color is noted with erythema which is circumferential. Periwound temperature was noted as No Abnormality. 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 There was a Excisional Skin/Subcutaneous Tissue Debridement with a total area of 2.25 sq cm performed by STONE III, HOYT E., PA-C. With the following instrument(s): Curette to remove Viable and Non-Viable tissue/material. Material removed includes Subcutaneous Tissue, Slough, and Fibrin/Exudate after achieving pain control using Lidocaine 4% Topical Solution. No specimens were taken. A time out was conducted at 09:59, prior to the start of the procedure. A Minimum amount of bleeding was controlled with Pressure. The procedure was tolerated well with a pain level of 0 throughout and a pain level of 0 following the procedure. Patient s Level of Consciousness post procedure was recorded as Awake and Alert. Post Debridement Measurements: 1.5cm length x 1.5cm width x 0.5cm depth; 0.884cm^3 volume. Character of Wound/Ulcer Post Debridement is improved. Severity of Tissue Post Debridement is: Fat layer exposed. Post procedure Diagnosis Wound #1: Same as  Pre-Procedure Wound #2 Pre-procedure diagnosis of Wound #2 is a Pressure Ulcer located on the Left,Lateral Malleolus . There was a Excisional Skin/Subcutaneous Tissue Debridement with a total area of 1.76 sq cm performed by STONE III, HOYT E., PA-C. With the following instrument(s): Curette to remove Viable and Non-Viable tissue/material. Material removed includes Subcutaneous Tissue, Slough, and Fibrin/Exudate after achieving pain control using Lidocaine 4% Topical Solution. No specimens were taken. A time out was conducted at 09:56, prior to the start of the procedure. A Minimum amount of bleeding was controlled with Pressure. The procedure was tolerated well with a pain level of 0 throughout and a pain level of 0 following the procedure. Patient s Level of Consciousness post procedure was recorded as Awake and Alert. Post Debridement Measurements: 1.6cm length x 1.1cm width x 0.4cm depth; 0.553cm^3 volume. Post debridement Stage noted as Category/Stage II. Character of Wound/Ulcer Post Debridement is improved. Post procedure Diagnosis Wound #2: Same as Pre-Procedure [name], [name] ([id number]) Plan Wound Cleansing: Wound #1 Right,Lateral Malleolus: Clean wound with Normal Saline. Cleanse wound with mild soap and water May Shower, gently pat wound dry prior to applying new dressing. Wound #2 Left,Lateral Malleolus: Clean wound with Normal Saline. Cleanse wound with mild soap and water May Shower, gently pat wound dry prior to applying new dressing. Anesthetic (add to Medication List): Wound #1 Right,Lateral Malleolus: Topical Lidocaine 4% cream applied to wound bed prior to debridement (In Clinic Only). Wound #2 Left,Lateral Malleolus: Topical Lidocaine 4% cream applied to wound bed prior to debridement (In Clinic Only). Primary Wound Dressing: Wound #1 Right,Lateral Malleolus: Silver Alginate Wound #2 Left,Lateral Malleolus: Silver Alginate Secondary Dressing: Wound #1  Right,Lateral Malleolus: ABD pad XtraSorb Wound #2 Left,Lateral Malleolus: ABD pad XtraSorb Dressing Change Frequency: Wound #1 Right,Lateral Malleolus: Change Dressing Monday, Wednesday, Friday Wound #2 Left,Lateral Malleolus: Change Dressing Monday, Wednesday, Friday Follow-up Appointments: Wound #1 Right,Lateral Malleolus: Return Appointment in 1 week. Wound #2 Left,Lateral Malleolus: Return Appointment in 1 week. Edema Control: Wound #1 Right,Lateral Malleolus: 3 Layer Compression System - Bilateral Elevate legs to the level of the heart and pump ankles as often as possible Wound #2 Left,Lateral Malleolus: 3 Layer Compression System - Bilateral Elevate legs to the level of the heart and pump ankles as often as possible Off-Loading: Wound #1 Right,Lateral Malleolus: Turn and reposition every 2 hours Other: - do not put pressure on your right ankle Wound #2 Left,Lateral Malleolus:  Turn and reposition every 2 hours Other: - do not put pressure on your right ankle Additional Orders / Instructions: [name], [name] ([id number]) Wound #1 Right,Lateral Malleolus: Increase protein intake. Other: - Add Vitamin C, Zinc, Vitamin A to your diet Wound #2 Left,Lateral Malleolus: Increase protein intake. Other: - Add Vitamin C, Zinc, Vitamin A to your diet Home Health: Wound #1 Right,Lateral Malleolus: [place] Nurse may visit PRN to address patient s wound care needs. FACE TO FACE ENCOUNTER: MEDICARE and MEDICAID PATIENTS: I certify that this patient is under my care and that I had a face-to-face encounter that meets the physician face-to-face encounter requirements with this patient on this date. The encounter with the patient was in whole or in part for the following MEDICAL CONDITION: (primary reason for [place]) MEDICAL NECESSITY: I certify, that based on my findings, NURSING services are a medically necessary home health service. HOME  BOUND STATUS: I certify that my clinical findings support that this patient is homebound (i.e., Due to illness or injury, pt requires aid of supportive devices such as crutches, cane, wheelchairs, walkers, the use of special transportation or the assistance of another person to leave their place of residence. There is a normal inability to leave the home and doing so requires considerable and taxing effort. Other absences are for medical reasons / religious services and are infrequent or of short duration when for other reasons). If current dressing causes regression in wound condition, may D/C ordered dressing product/s and apply Normal Saline Moist Dressing daily until next [place] / Other MD appointment. [place] of regression in wound condition at [phone]. Please direct any NON-WOUND related issues/requests for orders to patient's Primary Care Physician Wound #2 Left,Lateral Malleolus: [place] Nurse may visit PRN to address patient s wound care needs. FACE TO FACE ENCOUNTER: MEDICARE and MEDICAID PATIENTS: I certify that this patient is under my care and that I had a face-to-face encounter that meets the physician face-to-face encounter requirements with this patient on this date. The encounter with the patient was in whole or in part for the following MEDICAL CONDITION: (primary reason for [place]) MEDICAL NECESSITY: I certify, that based on my findings, NURSING services are a medically necessary home health service. HOME BOUND STATUS: I certify that my clinical findings support that this patient is homebound (i.e., Due to illness or injury, pt requires aid of supportive devices such as crutches, cane, wheelchairs, walkers, the use of special transportation or the assistance of another person to leave their place of residence. There is a normal inability to leave the home and doing so requires considerable and taxing  effort. Other absences are for medical reasons / religious services and are infrequent or of short duration when for other reasons). If current dressing causes regression in wound condition, may D/C ordered dressing product/s and apply Normal Saline Moist Dressing daily until next [place] / Other MD appointment. [place] of regression in wound condition at [phone]. Please direct any NON-WOUND related issues/requests for orders to patient's Primary Care Physician I'm in a recommend that we continue with the Current wound care measures for the next week. She's in agreement with plan. Subsequently we will see were things stand at follow-up. Please see above for specific wound care orders. We will see patient for re-evaluation in 1 week(s) here in the clinic. If anything worsens or changes patient will contact our office  for additional recommendations. Electronic Signature(s) Signed: 06/16/2018 11:23:32 PM By: Worthy Keeler PA-C Entered By: Worthy Keeler on 06/16/2018 23:05:06 [place], [name] ([id number]) -------------------------------------------------------------------------------- ROS/PFSH Details Patient Name: [name] Date of Service: 06/14/2018 9:30 AM Medical Record Number: [id number] Patient Account Number: [id number] Date of Birth/Sex: 06-25-1923 (84 y.o. F) Treating RN: Montey Hora Primary Care Provider: FITZGERALD, DAVID Other Clinician: Referring Provider: FITZGERALD, DAVID Treating Provider/Extender: STONE III, HOYT Weeks in Treatment: 71 Information Obtained From Patient Wound History Do you currently have one or more open woundso Yes How many open wounds do you currently haveo 1 Approximately how long have you had your woundso 2 yrs How have you been treating your wound(s) until nowo mupirocin, soaking in epsom salt Has your wound(s) ever healed and then re-openedo No Have you had any lab work done in the past montho  No Have you tested positive for an antibiotic resistant organism (MRSA, VRE)o No Have you tested positive for osteomyelitis (bone infection)o No Have you had any tests for circulation on your legso Yes Who ordered the testo Dr. Lucky Cowboy [facility] test [facility] Constitutional Symptoms (General Health) Complaints and Symptoms: Negative for: Fever; Chills Eyes Medical History: Positive for: Cataracts - surgery Respiratory Complaints and Symptoms: No Complaints or Symptoms Cardiovascular Complaints and Symptoms: No Complaints or Symptoms Medical History: Positive for: Congestive Heart Failure; Hypertension Musculoskeletal Medical History: Positive for: Osteoarthritis Neurologic Medical History: Positive for: Neuropathy Oncologic TATTIANA, FAKHOURI ([id number]) Medical History: Negative for: Received Chemotherapy; Received Radiation Psychiatric Complaints and Symptoms: No Complaints or Symptoms HBO Extended History Items Eyes: Cataracts Immunizations Pneumococcal Vaccine: Received Pneumococcal Vaccination: Yes Implantable Devices Family and Social History Cancer: Yes - Father,Siblings; Diabetes: No; Heart Disease: Yes - Siblings; Hereditary Spherocytosis: No; Hypertension: No; Kidney Disease: No; Lung Disease: No; Seizures: No; Stroke: No; Thyroid Problems: No; Tuberculosis: No; Never smoker; Marital Status - Married; Alcohol Use: Never; Drug Use: No History; Caffeine Use: Rarely; Financial Concerns: No; Food, Clothing or Shelter Needs: No; Support System Lacking: No; Transportation Concerns: No; Advanced Directives: No; Patient does not want information on Advanced Directives; Do not resuscitate: No; Living Will: Yes (Not Provided); Medical Power of Attorney: No Physician Affirmation I have reviewed and agree with the above information. Electronic Signature(s) Signed: 06/16/2018 11:23:32 PM By: Worthy Keeler PA-C Signed: 06/17/2018 5:53:18 PM By: Montey Hora Entered By: Worthy Keeler on 06/16/2018 23:04:20 [place], [name] ([id number]) -------------------------------------------------------------------------------- SuperBill Details Patient Name: [name] Date of Service: 06/14/2018 Medical Record Number: [id number] Patient Account Number: [id number] Date of Birth/Sex: 1923/05/15 (84 y.o. 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 I89.0 Lymphedema, not elsewhere classified B35.4 Tinea corporis L89.523 Pressure ulcer of left ankle, stage 3 Facility Procedures CPT4 Code: [id number] Description: 49201 - DEB SUBQ TISSUE 20 SQ CM/< ICD-10 Diagnosis Description 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, stage 4 L89.523 Pressure ulcer of left ankle, stage 3 Modifier: Quantity: 1 Physician Procedures CPT4 Code: [id number] Description: 11042 - WC PHYS SUBQ TISS 20 SQ CM ICD-10 Diagnosis Description 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, stage 4 L89.523 Pressure ulcer of left ankle, stage 3 Modifier: Quantity: 1 Electronic Signature(s) Signed: 06/16/2018 11:23:32 PM By: Worthy Keeler PA-C Entered By: Melburn Hake,  [place] on 06/16/2018 23:05:24

## 2018-06-18 NOTE — Progress Notes (Signed)
WALDINE, ZENZ ([id number]) Visit Report for 06/14/2018 Arrival Information Details Patient Name: [name], [name] Date of Service: 06/14/2018 9:30 AM Medical Record Number: [id number] Patient Account Number: [id number] Date of Birth/Sex: 1923/01/30 (84 y.o. F) Treating RN: Secundino Ginger Primary Care Derhonda Eastlick: FITZGERALD, DAVID Other Clinician: Referring Cordelia Bessinger: FITZGERALD, DAVID Treating Chesney Klimaszewski/Extender: STONE III, HOYT Weeks in Treatment: 57 Visit Information History Since Last Visit Added or deleted any medications: No Patient Arrived: Walker Any new allergies or adverse reactions: No Arrival Time: 09:29 Had a fall or experienced change in No Accompanied By: daughter activities of daily living that may affect Transfer Assistance: None risk of falls: Patient Identification Verified: Yes Signs or symptoms of abuse/neglect since last visito No Secondary Verification Process Yes Hospitalized since last visit: No Completed: Implantable device outside of the clinic excluding No Patient Requires Transmission-Based No cellular tissue based products placed in the center Precautions: since last visit: Patient Has Alerts: Yes Has Dressing in Place as Prescribed: Yes Patient Alerts: ABI AVVS 03/29/18 L Pain Present Now: No 1.05 R 1.03, TBI L .56, R .85 Electronic Signature(s) Signed: 06/14/2018 9:54:41 AM By: Secundino Ginger Entered By: Secundino Ginger on 06/14/2018 09:33:54 [place], [name] ([id number]) -------------------------------------------------------------------------------- Encounter Discharge Information Details Patient Name: [name] Date of Service: 06/14/2018 9:30 AM Medical Record Number: [id number] Patient Account Number: [id number] Date of Birth/Sex: 24-Apr-1923 (84 y.o. F) Treating RN: Roger Shelter Primary Care Vaidehi Braddy: FITZGERALD, DAVID Other Clinician: Referring Vianka Ertel: FITZGERALD, DAVID Treating Jefferie Holston/Extender: STONE III, HOYT Weeks in  Treatment: 76 Encounter Discharge Information Items Discharge Condition: Stable Ambulatory Status: Walker Discharge Destination: Home Transportation: Private Auto Accompanied By: daughter Schedule Follow-up Appointment: Yes Clinical Summary of Care: Electronic Signature(s) Signed: 06/14/2018 10:58:55 AM By: Roger Shelter Entered By: Roger Shelter on 06/14/2018 10:23:37 [place]haw, [name] ([id number]) -------------------------------------------------------------------------------- Lower Extremity Assessment Details Patient Name: [name] Date of Service: 06/14/2018 9:30 AM Medical Record Number: [id number] Patient Account Number: [id number] Date of Birth/Sex: 06-27-1923 (84 y.o. F) Treating RN: Secundino Ginger Primary Care Vincy Feliz: FITZGERALD, DAVID Other Clinician: Referring Karla Vines: FITZGERALD, DAVID Treating Torian Thoennes/Extender: STONE III, HOYT Weeks in Treatment: 44 Edema Assessment Assessed: [Left: No] [Right: No] [Left: Edema] [Right: :] Calf Left: Right: Point of Measurement: 29 cm From Medial Instep 31 cm 30 cm Ankle Left: Right: Point of Measurement: 12 cm From Medial Instep 19.5 cm 20.5 cm Vascular Assessment Claudication: Claudication Assessment [Left:None] [Right:None] Pulses: Dorsalis Pedis Palpable: [Left:Yes] [Right:Yes] Posterior Tibial Extremity colors, hair growth, and conditions: Extremity Color: [Left:Hyperpigmented] [Right:Hyperpigmented] Temperature of Extremity: [Left:Warm] [Right:Warm] Capillary Refill: [Left:< 3 seconds] [Right:< 3 seconds] Toe Nail Assessment Left: Right: Thick: Yes Yes Discolored: Yes Yes Deformed: Yes Yes Improper Length and Hygiene: No No Electronic Signature(s) Signed: 06/14/2018 9:54:41 AM By: Secundino Ginger Entered By: Secundino Ginger on 06/14/2018 09:41:27 Neuberger, [name] ([id number]) -------------------------------------------------------------------------------- Multi Wound Chart Details Patient Name: [name] Date of Service: 06/14/2018 9:30 AM Medical Record Number: [id number] Patient Account Number: [id number] Date of Birth/Sex: February 17, 1923 (84 y.o. F) Treating RN: Montey Hora Primary Care Travis Purk: FITZGERALD, DAVID Other Clinician: Referring Kathleen Likins: FITZGERALD, DAVID Treating Ellory Khurana/Extender: STONE III, HOYT Weeks in Treatment: 44 Vital Signs Height(in): 65 Pulse(bpm): 87 Weight(lbs): 154.3 Blood Pressure(mmHg): 135/62 Body Mass Index(BMI): 26 Temperature(F): 98.5 Respiratory Rate 18 (breaths/min): Photos: [N/A:N/A] Wound Location: Right Malleolus - Lateral Left Malleolus - Lateral N/A Wounding Event: Gradually Appeared Gradually Appeared N/A Primary Etiology: Lymphedema Pressure Ulcer N/A Secondary Etiology: Arterial Insufficiency Ulcer N/A N/A Comorbid History: Cataracts, Congestive Heart Cataracts, Congestive  Heart N/A Failure, Hypertension, Failure, Hypertension, Osteoarthritis, Neuropathy Osteoarthritis, Neuropathy Date Acquired: 08/11/2015 04/12/2018 N/A Weeks of Treatment: 44 8 N/A Wound Status: Open Open N/A Measurements L x W x D 1.5x1.5x0.4 1.6x1.1x0.3 N/A (cm) Area (cm) : 1.767 1.382 N/A Volume (cm) : 0.707 0.415 N/A % Reduction in Area: -15.30% -76.10% N/A % Reduction in Volume: -54.00% -425.30% N/A Classification: Full Thickness With Exposed Category/Stage II N/A Support Structures Exudate Amount: Large Medium N/A Exudate Type: Purulent Purulent N/A Exudate Color: yellow, brown, green yellow, brown, green N/A Wound Margin: Distinct, outline attached Flat and Intact N/A Granulation Amount: None Present (0%) None Present (0%) N/A Necrotic Amount: Large (67-100%) Large (67-100%) N/A Necrotic Tissue: Eschar, Adherent [place] N/A Exposed Structures: Fat Layer (Subcutaneous Fat Layer (Subcutaneous N/A Tissue) Exposed: Yes Tissue) Exposed: Yes Fascia: No Fascia: No Tendon: No Tendon: No Thresher, [name] ([id number]) Muscle:  No Muscle: No Joint: No Joint: No Bone: No Bone: No Epithelialization: None None N/A Periwound Skin Texture: Excoriation: No Excoriation: No N/A Induration: No Induration: No Callus: No Callus: No Crepitus: No Crepitus: No Rash: No Rash: No Scarring: No Scarring: No Periwound Skin Moisture: Maceration: Yes Maceration: Yes N/A Dry/Scaly: Yes Dry/Scaly: Yes Periwound Skin Color: Erythema: Yes Erythema: Yes N/A Rubor: Yes Atrophie Blanche: No Atrophie Blanche: No Cyanosis: No Cyanosis: No Ecchymosis: No Ecchymosis: No Hemosiderin Staining: No Hemosiderin Staining: No Mottled: No Mottled: No Pallor: No Pallor: No Rubor: No Erythema Location: Circumferential Circumferential N/A Temperature: No Abnormality No Abnormality N/A Tenderness on Palpation: Yes No N/A Wound Preparation: Ulcer Cleansing: Ulcer Cleansing: N/A Rinsed/Irrigated with Saline Rinsed/Irrigated with Saline Topical Anesthetic Applied: Topical Anesthetic Applied: Other: lidocaine 4% Other: lidocaine 4% Treatment Notes Electronic Signature(s) Signed: 06/17/2018 5:53:18 PM By: Montey Hora Entered By: Montey Hora on 06/14/2018 09:55:49 [place]co, [name] ([id number]) -------------------------------------------------------------------------------- [place] Details Patient Name: [name] Date of Service: 06/14/2018 9:30 AM Medical Record Number: [id number] Patient Account Number: [id number] Date of Birth/Sex: December 17, 1922 (84 y.o. F) Treating RN: Montey Hora Primary Care Maizy Davanzo: FITZGERALD, DAVID Other Clinician: Referring Tarika Mckethan: FITZGERALD, DAVID Treating Gradyn Shein/Extender: STONE III, HOYT Weeks in Treatment: 30 Active Inactive ` Abuse / Safety / Falls / Self Care Management Nursing Diagnoses: Potential for falls Goals: Patient will not experience any injury related to falls Date Initiated: 08/10/2017 Target Resolution Date: 11/10/2017 Goal Status:  Active Interventions: Assess Activities of Daily Living upon admission and as needed Assess fall risk on admission and as needed Assess: immobility, friction, shearing, incontinence upon admission and as needed Notes: ` Nutrition Nursing Diagnoses: Imbalanced nutrition Potential for alteratiion in Nutrition/Potential for imbalanced nutrition Goals: Patient/caregiver agrees to and verbalizes understanding of need to use nutritional supplements and/or vitamins as prescribed Date Initiated: 08/10/2017 Target Resolution Date: 12/08/2017 Goal Status: Active Interventions: Assess patient nutrition upon admission and as needed per policy Notes: ` Orientation to the Wound Care Program Nursing Diagnoses: Knowledge deficit related to the wound healing center program Goals: Patient/caregiver will verbalize understanding of the [facility] Date Initiated: 08/10/2017 Target Resolution Date: 09/08/2017 [name], [name] ([id number]) Goal Status: Active Interventions: Provide education on orientation to the wound center Notes: ` Pain, Acute or Chronic Nursing Diagnoses: Pain, acute or chronic: actual or potential Potential alteration in comfort, pain Goals: Patient/caregiver will verbalize adequate pain control between visits Date Initiated: 08/10/2017 Target Resolution Date: 12/08/2017 Goal Status: Active Interventions: Complete pain assessment as per visit requirements Notes: ` Wound/Skin Impairment Nursing Diagnoses: Impaired tissue integrity Knowledge deficit related to  ulceration/compromised skin integrity Goals: Ulcer/skin breakdown will have a volume reduction of 80% by week 12 Date Initiated: 08/10/2017 Target Resolution Date: 12/01/2017 Goal Status: Active Interventions: Assess patient/caregiver ability to perform ulcer/skin care regimen upon admission and as needed Notes: Electronic Signature(s) Signed: 06/17/2018 5:53:18 PM By: Montey Hora Entered By:  Montey Hora on 06/14/2018 09:54:41 Warrior, [name] ([id number]) -------------------------------------------------------------------------------- Pain Assessment Details Patient Name: [name] Date of Service: 06/14/2018 9:30 AM Medical Record Number: [id number] Patient Account Number: [id number] Date of Birth/Sex: 1923-06-14 (84 y.o. F) Treating RN: Secundino Ginger Primary Care Lazarius Rivkin: FITZGERALD, DAVID Other Clinician: Referring Raynette Arras: FITZGERALD, DAVID Treating Arisbel Maione/Extender: STONE III, HOYT Weeks in Treatment: 39 Active Problems Location of Pain Severity and Description of Pain Patient Has Paino No Site Locations Pain Management and Medication Current Pain Management: Goals for Pain Management Topical or injectable lidocaine is offered to patient for acute pain when surgical debridement is performed. If needed, Patient is instructed to use over the counter pain medication for the following 24-48 hours after debridement. Wound care MDs do not prescribed pain medications. Patient has chronic pain or uncontrolled pain. Patient has been instructed to make an appointment with their Primary Care Physician for pain management. Electronic Signature(s) Signed: 06/14/2018 9:54:41 AM By: Secundino Ginger Entered By: Secundino Ginger on 06/14/2018 09:34:30 Streight, [name] ([id number]) -------------------------------------------------------------------------------- Patient/Caregiver Education Details Patient Name: [name] Date of Service: 06/14/2018 9:30 AM Medical Record Number: [id number] Patient Account Number: [id number] Date of Birth/Gender: 1923/08/16 (84 y.o. F) Treating RN: Roger Shelter Primary Care Physician: FITZGERALD, DAVID Other Clinician: Referring Physician: FITZGERALD, DAVID Treating Physician/Extender: Melburn Hake, HOYT Weeks in Treatment: 39 Education Assessment Education Provided To: Patient Education Topics Provided Wound Debridement: Handouts:  Wound Debridement Methods: Explain/Verbal Responses: State content correctly Wound/Skin Impairment: Handouts: Caring for Your Ulcer Methods: Explain/Verbal Responses: State content correctly Electronic Signature(s) Signed: 06/14/2018 10:58:55 AM By: Roger Shelter Entered By: Roger Shelter on 06/14/2018 10:23:55 [place]elhinney, [name] ([id number]) -------------------------------------------------------------------------------- Wound Assessment Details Patient Name: [name] Date of Service: 06/14/2018 9:30 AM Medical Record Number: [id number] Patient Account Number: [id number] Date of Birth/Sex: October 31, 1923 (84 y.o. F) Treating RN: Secundino Ginger Primary Care Corayma Cashatt: FITZGERALD, DAVID Other Clinician: Referring Tamiyah Moulin: FITZGERALD, DAVID Treating Mikinzie Maciejewski/Extender: STONE III, HOYT Weeks in Treatment: 44 Wound Status Wound Number: 1 Primary Lymphedema Etiology: Wound Location: Right Malleolus - Lateral Secondary Arterial Insufficiency Ulcer Wounding Event: Gradually Appeared Etiology: Date Acquired: 08/11/2015 Wound Status: Open Weeks Of Treatment: 44 Comorbid Cataracts, Congestive Heart Failure, Clustered Wound: No History: Hypertension, Osteoarthritis, Neuropathy Photos Photo Uploaded By: Secundino Ginger on 06/14/2018 09:51:29 Wound Measurements Length: (cm) 1.5 % Reduction Width: (cm) 1.5 % Reduction Depth: (cm) 0.4 Epitheliali Area: (cm) 1.767 Tunneling: Volume: (cm) 0.707 Underminin in Area: -15.3% in Volume: -54% zation: None No g: No Wound Description Full Thickness With Exposed Support Foul Odor A Classification: Structures Slough/Fibr Wound Margin: Distinct, outline attached Exudate Large Amount: Exudate Type: Purulent Exudate Color: yellow, brown, green fter Cleansing: No ino Yes Wound Bed Granulation Amount: None Present (0%) Exposed Structure Necrotic Amount: Large (67-100%) Fascia Exposed: No Necrotic Quality: Eschar, Adherent Slough Fat  Layer (Subcutaneous Tissue) Exposed: Yes Tendon Exposed: No Muscle Exposed: No Joint Exposed: No Bone Exposed: No Herrman, [name] ([id number]) Periwound Skin Texture Texture Color No Abnormalities Noted: No No Abnormalities Noted: No Callus: No Atrophie Blanche: No Crepitus: No Cyanosis: No Excoriation: No Ecchymosis: No Induration: No Erythema: Yes Rash: No Erythema Location: Circumferential Scarring: No Hemosiderin Staining: No Mottled: No  Moisture Pallor: No No Abnormalities Noted: No Rubor: Yes Dry / Scaly: Yes Maceration: Yes Temperature / Pain Temperature: No Abnormality Tenderness on Palpation: Yes Wound Preparation Ulcer Cleansing: Rinsed/Irrigated with Saline Topical Anesthetic Applied: Other: lidocaine 4%, Treatment Notes Wound #1 (Right, Lateral Malleolus) 1. Cleansed with: Clean wound with Normal Saline 2. Anesthetic Topical Lidocaine 4% cream to wound bed prior to debridement 3. Peri-wound Care: Moisturizing lotion 4. Dressing Applied: Other dressing (specify in notes) 5. Secondary Dressing Applied ABD Pad 7. Secured with 3 Layer Compression System - Bilateral Notes silvercell Electronic Signature(s) Signed: 06/14/2018 9:54:41 AM By: Secundino Ginger Entered By: Secundino Ginger on 06/14/2018 09:37:11 Felan, [name] ([id number]) -------------------------------------------------------------------------------- Wound Assessment Details Patient Name: [name] Date of Service: 06/14/2018 9:30 AM Medical Record Number: [id number] Patient Account Number: [id number] Date of Birth/Sex: 02-28-1923 (84 y.o. F) Treating RN: Secundino Ginger Primary Care Krissa Utke: FITZGERALD, DAVID Other Clinician: Referring Areana Kosanke: FITZGERALD, DAVID Treating Amandalynn Pitz/Extender: STONE III, HOYT Weeks in Treatment: 44 Wound Status Wound Number: 2 Primary Pressure Ulcer Etiology: Wound Location: Left Malleolus - Lateral Wound Open Wounding Event: Gradually  Appeared Status: Date Acquired: 04/12/2018 Comorbid Cataracts, Congestive Heart Failure, Weeks Of Treatment: 8 History: Hypertension, Osteoarthritis, Neuropathy Clustered Wound: No Photos Photo Uploaded By: Secundino Ginger on 06/14/2018 09:51:30 Wound Measurements Length: (cm) 1.6 Width: (cm) 1.1 Depth: (cm) 0.3 Area: (cm) 1.382 Volume: (cm) 0.415 % Reduction in Area: -76.1% % Reduction in Volume: -425.3% Epithelialization: None Tunneling: No Undermining: No Wound Description Classification: Category/Stage II Foul Odor A Wound Margin: Flat and Intact Slough/Fibr Exudate Amount: Medium Exudate Type: Purulent Exudate Color: yellow, brown, green fter Cleansing: No ino Yes Wound Bed Granulation Amount: None Present (0%) Exposed Structure Necrotic Amount: Large (67-100%) Fascia Exposed: No Necrotic Quality: Adherent Slough Fat Layer (Subcutaneous Tissue) Exposed: Yes Tendon Exposed: No Muscle Exposed: No Joint Exposed: No Bone Exposed: No Periwound Skin Texture Szumski, [name] ([id number]) Texture Color No Abnormalities Noted: No No Abnormalities Noted: No Callus: No Atrophie Blanche: No Crepitus: No Cyanosis: No Excoriation: No Ecchymosis: No Induration: No Erythema: Yes Rash: No Erythema Location: Circumferential Scarring: No Hemosiderin Staining: No Mottled: No Moisture Pallor: No No Abnormalities Noted: No Rubor: No Dry / Scaly: Yes Maceration: Yes Temperature / Pain Temperature: No Abnormality Wound Preparation Ulcer Cleansing: Rinsed/Irrigated with Saline Topical Anesthetic Applied: Other: lidocaine 4%, Treatment Notes Wound #2 (Left, Lateral Malleolus) 1. Cleansed with: Clean wound with Normal Saline 2. Anesthetic Topical Lidocaine 4% cream to wound bed prior to debridement 3. Peri-wound Care: Moisturizing lotion 4. Dressing Applied: Other dressing (specify in notes) 5. Secondary Dressing Applied ABD Pad 7. Secured with 3 Layer  Compression System - Bilateral Notes silvercell Electronic Signature(s) Signed: 06/14/2018 9:54:41 AM By: Secundino Ginger Entered By: Secundino Ginger on 06/14/2018 09:38:23 Gucciardo, [name] ([id number]) -------------------------------------------------------------------------------- [place] Details Patient Name: [name] Date of Service: 06/14/2018 9:30 AM Medical Record Number: [id number] Patient Account Number: [id number] Date of Birth/Sex: Oct 18, 1923 (84 y.o. F) Treating RN: Secundino Ginger Primary Care Bowyn Mercier: FITZGERALD, DAVID Other Clinician: Referring Demetric Dunnaway: FITZGERALD, DAVID Treating Ellouise Mcwhirter/Extender: STONE III, HOYT Weeks in Treatment: 44 Vital Signs Time Taken: 09:20 Temperature (F): 98.5 Height (in): 65 Pulse (bpm): 87 Weight (lbs): 154.3 Respiratory Rate (breaths/min): 18 Body Mass Index (BMI): 25.7 Blood Pressure (mmHg): 135/62 Reference Range: 80 - 120 mg / dl Electronic Signature(s) Signed: 06/14/2018 9:54:41 AM By: Secundino Ginger Entered By: Secundino Ginger on 06/14/2018 09:35:00

## 2018-06-21 ENCOUNTER — Encounter: Payer: Medicare Other | Admitting: Physician Assistant

## 2018-06-21 DIAGNOSIS — L89514 Pressure ulcer of right ankle, stage 4: Secondary | ICD-10-CM | POA: Diagnosis not present

## 2018-06-24 NOTE — Progress Notes (Signed)
NEHEMIAH, MONTEE ([id number]) Visit Report for 06/21/2018 Chief Complaint Document Details Patient Name: SANYIA, DINI. Date of Service: 06/21/2018 10:30 AM Medical Record Number: [id number] Patient Account Number: [id number] Date of Birth/Sex: 09-Mar-1923 (84 y.o. F) Treating RN: Montey Hora Primary Care Provider: FITZGERALD, DAVID Other Clinician: Referring Provider: FITZGERALD, DAVID Treating Provider/Extender: STONE III, Fread Kottke Weeks in Treatment: 53 Information Obtained from: Patient Chief Complaint Patient is here for right lateral malleolus and left lateral malleolus ulcer Electronic Signature(s) Signed: 06/22/2018 1:32:06 AM By: Worthy Keeler PA-C Entered By: Worthy Keeler on 06/21/2018 10:38:53 Apo, [name] ([id number]) -------------------------------------------------------------------------------- Debridement Details Patient Name: [name] Date of Service: 06/21/2018 10:30 AM Medical Record Number: [id number] Patient Account Number: [id number] Date of Birth/Sex: 1923-04-07 (84 y.o. F) Treating RN: Montey Hora Primary Care Provider: FITZGERALD, DAVID Other Clinician: Referring Provider: FITZGERALD, DAVID Treating Provider/Extender: STONE III, Orpheus [place] Weeks in Treatment: 45 Debridement Performed for Wound #2 Left,Lateral Malleolus Assessment: Performed By: Physician STONE III, Trude Cansler E., PA-C Debridement Type: Debridement Pre-procedure Verification/Time Yes - 11:40 Out Taken: Start Time: 11:40 Pain Control: Lidocaine 4% Topical Solution Total Area Debrided (L x W): 1.6 (cm) x 1.5 (cm) = 2.4 (cm) Tissue and other material Viable, Non-Viable, Slough, Subcutaneous, Slough debrided: Level: Skin/Subcutaneous Tissue Debridement Description: Excisional Instrument: Curette Bleeding: Minimum Hemostasis Achieved: Pressure End Time: 11:42 Procedural Pain: 0 Post Procedural Pain: 0 Response to Treatment: Procedure was tolerated well Level of  Consciousness: Awake and Alert Post Debridement Measurements of Total Wound Length: (cm) 1.6 Stage: Category/Stage II Width: (cm) 1.5 Depth: (cm) 0.5 Volume: (cm) 0.942 Character of Wound/Ulcer Post Improved Debridement: Post Procedure Diagnosis Same as Pre-procedure Electronic Signature(s) Signed: 06/22/2018 1:32:06 AM By: Worthy Keeler PA-C Signed: 06/24/2018 2:22:32 PM By: Montey Hora Entered By: Montey Hora on 06/21/2018 11:43:04 Si, [name] ([id number]) -------------------------------------------------------------------------------- Debridement Details Patient Name: [name] Date of Service: 06/21/2018 10:30 AM Medical Record Number: [id number] Patient Account Number: [id number] Date of Birth/Sex: 09-Jun-1923 (84 y.o. F) Treating RN: Montey Hora Primary Care Provider: FITZGERALD, DAVID Other Clinician: Referring Provider: FITZGERALD, DAVID Treating Provider/Extender: STONE III, Jacobo Moncrief Weeks in Treatment: 45 Debridement Performed for Wound #1 Right,Lateral Malleolus Assessment: Performed By: Physician STONE III, Bellanie Matthew E., PA-C Debridement Type: Debridement Severity of Tissue Pre Fat layer exposed Debridement: Pre-procedure Verification/Time Yes - 11:42 Out Taken: Start Time: 11:42 Pain Control: Lidocaine 4% Topical Solution Total Area Debrided (L x W): 1.6 (cm) x 1.6 (cm) = 2.56 (cm) Tissue and other material Viable, Non-Viable, Slough, Subcutaneous, Slough debrided: Level: Skin/Subcutaneous Tissue Debridement Description: Excisional Instrument: Curette Bleeding: Minimum Hemostasis Achieved: Pressure End Time: 11:44 Procedural Pain: 0 Post Procedural Pain: 0 Response to Treatment: Procedure was tolerated well Level of Consciousness: Awake and Alert Post Debridement Measurements of Total Wound Length: (cm) 1.6 Width: (cm) 1.6 Depth: (cm) 0.6 Volume: (cm) 1.206 Character of Wound/Ulcer Post Debridement: Improved Severity of  Tissue Post Debridement: Fat layer exposed Post Procedure Diagnosis Same as Pre-procedure Electronic Signature(s) Signed: 06/22/2018 1:32:06 AM By: Worthy Keeler PA-C Signed: 06/24/2018 2:22:32 PM By: Montey Hora Entered By: Montey Hora on 06/21/2018 11:44:39 Coyt, [name] ([id number]) -------------------------------------------------------------------------------- HPI Details Patient Name: [name] Date of Service: 06/21/2018 10:30 AM Medical Record Number: [id number] Patient Account Number: [id number] Date of Birth/Sex: 1923/04/08 (84 y.o. 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 Dr. Amalia Hailey the podiatrist saw her on 07/20/2017 and sent her to the wound center for possible hyperbaric oxygen therapy. past medical history of peripheral vascular disease, varicose veins, status post appendectomy, basal cell carcinoma excision from the left leg, cholecystectomy, pacemaker placement, right lower extremity angiography done by Dr. dew in March 2017 with placement of a stent. there is also note of a successful ablation of the right small saphenous vein done which was reviewed by ultrasound on 10/24/2016. the patient had a right small saphenous vein ablation done on 10/20/2016. The patient has never been a smoker. She has been seen by Dr. Corene Cornea dew the vascular surgeon who most recently saw her on 06/15/2017 for evaluation of ongoing problems with right leg swelling. She had a lower extremity arterial duplex examination done(02/13/17) which showed patent distal right superficial femoral  artery stent and above-the-knee popliteal stent without evidence of restenosis. The ABI was more than 1.3 on the right and more than 1.3 on the left. This was consistent with noncompressible arteries due to medial calcification. The right great toe pressure and PPG waveforms are within normal limits and the left great toe pressure and PPG waveforms are decreased. he recommended she continue to wear her compression stockings and continue with elevation. She is scheduled to have a noninvasive arterial study in the near future 08/16/2017 -- had a lower extremity arterial duplex examination done which showed patent distal right superficial femoral artery stent and above-the-knee popliteal stent without evidence of restenosis. The ABI was more than 1.3 on the right and more than 1.3 on the left. This was consistent with noncompressible arteries due to medial calcification. The right great toe pressure and PPG waveforms are within normal limits and the left great toe pressure and PPG waveforms are decreased. the x-ray of the right ankle has not yet been done 08/24/2017 -- had a right ankle x-ray -- IMPRESSION:1. No fracture, bone lesion or evidence of osteomyelitis. 2. Lateral soft tissue swelling with a soft tissue ulcer. she has not yet seen the vascular surgeon for review 08/31/17 on evaluation today patient's wound appears to be showing signs of improvement. She still with her appointment with vascular in order to review her results of her vascular study and then determine if any intervention would be recommended at that time. No fevers, chills, nausea, or vomiting noted at this time. She has been tolerating the dressing changes without complication. 09/28/17 on evaluation today patient's wound appears to show signs of good improvement in regard to the granulation tissue which is surfacing. There is still a layer of slough covering the wound and the posterior portion is still significantly deeper  than the anterior nonetheless there has been some good sign of things moving towards the better. She is going to go back to Dr. dew for reevaluation to ensure her blood flow is still appropriate. That will be before her next evaluation with [place] next week. No fevers, chills, nausea, or vomiting noted at this time. Patient does have some discomfort rated to be a 3-4/10 depending on activity specifically cleansing the wound makes it worse. 10/05/2017 -- the patient was seen by Dr. Lucky Cowboy last week and noninvasive studies showed a normal right ABI with brisk triphasic waveforms consistent with no arterial insufficiency including normal digital pressures. The duplex showed a patent distal right SFA stent and  the proximal SFA was also normal. He was pleased with her test and thought she should have enough of perfusion for normal wound healing. He would see her back in 6 months time. 12/21/17 on evaluation today patient appears to be doing fairly well in regard to her right lateral ankle wound. Unfortunately the main issue that she is expansion at this point is that she is having some issues with what appears to be some cellulitis in the CHIDERA, DEARCOS. ([id number]) right anterior shin. She has also been noting a little bit of uncomfortable feeling especially last night and her ankle area. I'm afraid that she made the developing a little bit of an infection. With that being said I think it is in the early stages. 12/28/17 on evaluation today patient's ankle appears to be doing excellent. She's making good progress at this point the cellulitis seems to have improved after last week's evaluation. Overall she is having no significant discomfort which is excellent news. She does have an appointment with Dr. dew on March 29, 2018 for reevaluation in regard to the stent he placed. She seems to have excellent blood flow in the right lower extremity. 01/19/12 on evaluation today patient's wound appears to be doing  very well. In fact she does not appear to require debridement at this point, there's no evidence of infection, and overall from the standpoint of the wound she seems to be doing very well. With that being said I believe that it may be time to switch to different dressing away from the [name] Dressing she tells me she does have a lot going on her friend actually passed away yesterday and she's also having a lot of issues with her husband this obviously is weighing heavy on her as far as your thoughts and concerns today. 01/25/18 on evaluation today patient appears to be doing fairly well in regard to her right lateral malleolus. She has been tolerating the dressing changes without complication. Overall I feel like this is definitely showing signs of improvement as far as how the overall appearance of the wound is there's also evidence of epithelium start to migrate over the granulation tissue. In general I think that she is progressing nicely as far as the wound is concerned. The only concern she really has is whether or not we can switch to every other week visits in order to avoid having as many appointments as her daughters have a difficult time getting her to her appointments as well as the patient's husband to his he is not doing very well at this point. 02/22/18 on evaluation today patient's right lateral malleolus ulcer appears to be doing great. She has been tolerating the dressing changes without complication. Overall you making excellent progress at this time. Patient is having no significant discomfort. 03/15/18 on evaluation today patient appears to be doing much more poorly in regard to her right lateral ankle ulcer at this point. Unfortunately since have last seen her her husband has passed just a few days ago is obviously weighed heavily on her her daughter also had surgery well she is with her today as usual. There does not appear to be any evidence of infection she does seem to  have significant contusion/deep tissue injury to the right lateral malleolus which was not noted previous when I saw her last. It's hard to tell of exactly when this injury occurred although during the time she was spending the night in the hospital this may have been most likely. 03/22/18  on evaluation today patient appears to actually be doing very well in regard to her ulcer. She did unfortunately have a setback which was noted last week however the good news is we seem to be getting back on track and in fact the wound in the core did still have some necrotic tissue which will be addressed at this point today but in general I'm seeing signs that things are on the up and up. She is glad to hear this obviously she's been somewhat concerned that due to the how her wound digressed more recently. 03/29/18 on evaluation today patient appears to be doing fairly well in regard to her right lower extremity lateral malleolus ulcer. She unfortunately does have a new area of pressure injury over the inferior portion where the wound has opened up a little bit larger secondary to the pressure she seems to be getting. She does tell me sometimes when she sleeps at night that it actually hurts and does seem to be pushing on the area little bit more unfortunately. There does not appear to be any evidence of infection which is good news. She has been tolerating the dressing changes without complication. She also did have some bruising in the left second and third toes due to the fact that she may have bump this or injured it although she has neuropathy so she does not feel she did move recently that may have been where this came from. Nonetheless there does not appear to be any evidence of infection at this time. 04/12/18 on evaluation today patient's wound on the right lateral ankle actually appears to be doing a little bit better with a lot of necrotic docking tissue centrally loosening up in clearing away. However  she does have the beginnings of a deep tissue injury on the left lateral malleolus likely due to the fact we've been trying offload the right as much as we have. I think she may benefit from an assistive soft device to help with offloading and it looks like they're looking at one of the doughnut conditions that wraps around the lower leg to offload which I think will definitely do a good job. With that being said I think we definitely need to address this issue on the left before it becomes a wound. Patient is not having significant pain. 04/19/18 on evaluation today patient appears to be doing excellent in regard to the progress she's made with her right lateral ankle ulcer. The left ankle region which did show evidence of a deep tissue injury seems to be resolving there's little fluid noted underneath and a blister there's nothing open at this point in time overall I feel like this is progressing nicely which is good news. She does not seem to be having significant discomfort at this point which is also good news. 04/25/18-She is here in follow up evaluation for bilateral lateral malleolar ulcers. The right lateral malleolus ulcer with pale subcutaneous tissue exposure, central area of ulcer with tendon/periosteum exposed. The left lateral malleolus ulcer now with Lanzer, [name] ([id number]) central area of nonviable tissue, otherwise deep tissue injury. She is wearing compression wraps to the left lower extremity, she will place the right lower extremity compression wraps on when she gets home. She will be out of town over the weekend and return next week and follow-up appointment. She completed her doxycycline this morning 05/03/18 on evaluation today patient appears to be doing very well in regard to her right lateral ankle ulcer in general. At  least she's showing some signs of improvement in this regard. Unfortunately she has some additional injury to the left lateral malleolus region which  appears to be new likely even over the past several days. Again this determination is based on the overall appearance. With that being said the patient is obviously frustrated about this currently. 05/10/18-She is here in follow-up evaluation for bilateral lateral malleolar ulcers. She states she has purchased offloading shoes/boots and they will arrive tomorrow. She was asked to bring them in the office at next week's appointment so her provider is aware of product being utilized. She continues to sleep on right or left side, she has been encouraged to sleep on her back. The right lateral malleolus ulcer is precariously close to peri-osteum; will order xray. The left lateral malleolus ulcer is improved. Will switch back to santyl; she will follow up next week. 05/17/18 on evaluation today patient actually appears to be doing very well in regard to her malleolus her ulcers compared to last time I saw them. She does not seem to have as much in the way of contusion at this point which is great news. With that being said she does continue to have discomfort and I do believe that she is still continuing to benefit from the offloading/pressure reducing boots that were recommended. I think this is the key to trying to get this to heal up completely. 05/24/18 on evaluation today patient actually appears to be doing worse at this point in time unfortunately compared to her last week's evaluation. She is having really no increased pain which is good news unfortunately she does have more maceration in your theme and noted surrounding the right lateral ankle the left lateral ankle is not really is erythematous I do not see signs of the overt cellulitis on that side. Unfortunately the wounds do not seem to have shown any signs of improvement since the last evaluation. She also has significant swelling especially on the right compared to previous some of this may be due to infection however also think that she may be  served better while she has these wounds by compression wrapping versus continuing to use the Juxta-Lite for the time being. Especially with the amount of drainage that she is experiencing at this point. No fevers, chills, nausea, or vomiting noted at this time. 05/31/18 on evaluation today patient appears to actually be doing better in regard to her right lateral lower extremity ulcer specifically on the malleolus region. She has been tolerating the antibiotic without complication. With that being said she still continues to have issues but a little bit of redness although nothing like she what she was experiencing previous. She still continues to pressure to her ankle area she did get the problem on offloading boots unfortunately she will not wear them she states there too uncomfortable and she can't get in and out of the bed. Nonetheless at this point her wounds seem to be continually getting worse which is not what we want I'm getting somewhat concerned about her progress and how things are going to proceed if we do not intervene in some way shape or form. I therefore had a very lengthy conversation today about offloading yet again and even made a specific suggestion for switching her to a memory foam mattress and even gave the information for a specific one that they could look at getting if it was something that they were interested in considering. She does not want to be considered for a hospital bed  air mattress although honestly insurance would not cover it that she does not have any wounds on her trunk. 06/14/18 on evaluation today both wounds over the bilateral lateral malleolus her ulcers appear to be doing better there's no evidence of pressure injury at this point. She did get the foam mattress for her bed and this does seem to have been extremely beneficial for her in my pinion. Her daughter states that she is having difficulty getting out of bed because of how soft it is. The patient  also relates this to be. Nonetheless I do feel like she's actually doing better. Unfortunately right after and around the time she was getting the mattress she also sustained a fall when she got up to go pick up the phone and ended up injuring her right elbow she has 18 sutures in place. We are not caring for this currently although home health is going to be taking the sutures out shortly. Nonetheless this may be something that we need to evaluate going forward. It depends on how well it has or has not healed in the end. She also recently saw an orthopedic specialist for an injection in the right shoulder just before her fall unfortunately the fall seems to have worsened her pain. 06/21/18 on evaluation today patient appears to be doing about the same in regard to her lateral malleolus ulcers. Both appear to be just a little bit deeper but again we are clinging away the necrotic and dead tissue which I think is why this is progressing towards a deeper realm as opposed to improving from my measurement standpoint in that regard. Nonetheless she has been tolerating the dressing changes she absolutely hates the memory foam mattress topper that was obtained for her nonetheless I do believe this is still doing excellent as far as taking care of excess pressure in regard to the lateral malleolus regions. She in fact has no pressure injury that I see whereas in weeks past it was week by week I was constantly seeing new pressure injuries. Overall I think it has been very beneficial for her. Electronic Signature(s) KAYELEE, HERBIG ([id number]) Signed: 06/22/2018 1:32:06 AM By: Worthy Keeler PA-C Entered By: Worthy Keeler on 06/21/2018 23:52:09 Jullisa, Grigoryan [name] ([id number]) -------------------------------------------------------------------------------- Physical Exam Details Patient Name: HEELA, HEISHMAN Date of Service: 06/21/2018 10:30 AM Medical Record Number: [id number] Patient Account  Number: [id number] Date of Birth/Sex: [birth year] (84 y.o. F) Treating RN: Montey Hora Primary Care Provider: FITZGERALD, DAVID Other Clinician: Referring Provider: FITZGERALD, DAVID Treating Provider/Extender: STONE III, Shahzain Kiester Weeks in Treatment: 26 Constitutional Well-nourished and well-hydrated in no acute distress. Respiratory normal breathing without difficulty. clear to auscultation bilaterally. Cardiovascular regular rate and rhythm with normal S1, S2. Psychiatric this patient is able to make decisions and demonstrates good insight into disease process. Alert and Oriented x 3. pleasant and cooperative. Notes At this point the patient seems to be doing fairly well I did require sharp debridement today in order to clean away the necrotic tissue in the base of the wound. After discussion it was determined that the patient really would like to try utilizing her Juxta- Lite compression wraps which I think we can definitely give this a try in that regard. Electronic Signature(s) Signed: 06/22/2018 1:32:06 AM By: Worthy Keeler PA-C Entered By: Worthy Keeler on 06/21/2018 23:53:01 Gasner, [name] ([id number]) -------------------------------------------------------------------------------- Physician Orders Details Patient Name: [name] Date of Service: 06/21/2018 10:30 AM Medical Record Number: [id number] Patient Account Number: [id number]  Date of Birth/Sex: October 22, 1923 (84 y.o. F) Treating RN: Montey Hora Primary Care Provider: FITZGERALD, DAVID Other Clinician: Referring Provider: FITZGERALD, DAVID Treating Provider/Extender: STONE III, Micheal Sheen Weeks in Treatment: 27 Verbal / Phone Orders: No Diagnosis Coding ICD-10 Coding Code Description 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, stage 4 I70.233 Atherosclerosis of native arteries of right leg with ulceration of ankle I89.0 Lymphedema, not elsewhere classified B35.4 Tinea corporis L89.523 Pressure ulcer of left  ankle, stage 3 Wound Cleansing Wound #1 Right,Lateral Malleolus o Clean wound with Normal Saline. o Cleanse wound with mild soap and water o May Shower, gently pat wound dry prior to applying new dressing. Wound #2 Left,Lateral Malleolus o Clean wound with Normal Saline. o Cleanse wound with mild soap and water o May Shower, gently pat wound dry prior to applying new dressing. Anesthetic (add to Medication List) Wound #1 Right,Lateral Malleolus o Topical Lidocaine 4% cream applied to wound bed prior to debridement (In Clinic Only). Wound #2 Left,Lateral Malleolus o Topical Lidocaine 4% cream applied to wound bed prior to debridement (In Clinic Only). Primary Wound Dressing Wound #1 Right,Lateral Malleolus o Silver Alginate Wound #2 Left,Lateral Malleolus o Silver [place] Secondary Dressing Wound #1 Right,Lateral Malleolus o Boardered Foam Dressing Wound #2 Left,Lateral Malleolus o Boardered Foam Dressing Dressing Change Frequency Wound #1 Right,Lateral Malleolus CLARINE, ELROD ([id number]) o Change Dressing Monday, Wednesday, Friday Wound #2 Left,Lateral Malleolus o Change Dressing Monday, Wednesday, Friday Follow-up Appointments Wound #1 Right,Lateral Malleolus o Return Appointment in 2 weeks. Wound #2 Left,Lateral Malleolus o Return Appointment in 2 weeks. Edema Control Wound #1 Right,Lateral Malleolus o Patient to wear own Velcro compression garment. o Elevate legs to the level of the heart and pump ankles as often as possible Wound #2 Left,Lateral Malleolus o Patient to wear own Velcro compression garment. o Elevate legs to the level of the heart and pump ankles as often as possible Off-Loading Wound #1 Right,Lateral Malleolus o Turn and reposition every 2 hours o Other: - do not put pressure on your right ankle Wound #2 Left,Lateral Malleolus o Turn and reposition every 2 hours o Other: - do not put pressure on  your right ankle Additional Orders / Instructions Wound #1 Right,Lateral Malleolus o Increase protein intake. o Other: - Add Vitamin C, Zinc, Vitamin A to your diet Wound #2 Left,Lateral Malleolus o Increase protein intake. o Other: - Add Vitamin C, Zinc, Vitamin A to your diet Home Health Wound #1 [place] Visits - [place] Nurse may visit PRN to address patientos wound care needs. o FACE TO FACE ENCOUNTER: MEDICARE and MEDICAID PATIENTS: I certify that this patient is under my care and that I had a face-to-face encounter that meets the physician face-to-face encounter requirements with this patient on this date. The encounter with the patient was in whole or in part for the following MEDICAL CONDITION: (primary reason for [place]) MEDICAL NECESSITY: I certify, that based on my findings, NURSING services are a medically necessary home health service. HOME BOUND STATUS: I certify that my clinical findings support that this patient is homebound (i.e., Due to illness or injury, pt requires aid of supportive devices such as crutches, cane, wheelchairs, walkers, the use of special transportation or the assistance of another person to leave their place of residence. There is a normal inability to leave the home and doing so requires considerable and taxing effort. Other absences are for medical reasons / religious services and are infrequent or of  short duration when for other reasons). MICHAELYN, WALL ([id number]) o If current dressing causes regression in wound condition, may D/C ordered dressing product/s and apply Normal Saline Moist Dressing daily until next [place] / Other MD appointment. [place] of regression in wound condition at [phone]. o Please direct any NON-WOUND related issues/requests for orders to patient's Primary Care Physician Wound #2 [place] Visits - [place] Nurse may visit PRN to address patientos wound care needs. o FACE TO FACE ENCOUNTER: MEDICARE and MEDICAID PATIENTS: I certify that this patient is under my care and that I had a face-to-face encounter that meets the physician face-to-face encounter requirements with this patient on this date. The encounter with the patient was in whole or in part for the following MEDICAL CONDITION: (primary reason for [place]) MEDICAL NECESSITY: I certify, that based on my findings, NURSING services are a medically necessary home health service. HOME BOUND STATUS: I certify that my clinical findings support that this patient is homebound (i.e., Due to illness or injury, pt requires aid of supportive devices such as crutches, cane, wheelchairs, walkers, the use of special transportation or the assistance of another person to leave their place of residence. There is a normal inability to leave the home and doing so requires considerable and taxing effort. Other absences are for medical reasons / religious services and are infrequent or of short duration when for other reasons). o If current dressing causes regression in wound condition, may D/C ordered dressing product/s and apply Normal Saline Moist Dressing daily until next [place] / Other MD appointment. [place] of regression in wound condition at [phone]. o Please direct any NON-WOUND related issues/requests for orders to patient's Primary Care Physician Electronic Signature(s) Signed: 06/22/2018 1:32:06 AM By: Worthy Keeler PA-C Signed: 06/24/2018 2:22:32 PM By: Montey Hora Entered By: Montey Hora on 06/21/2018 11:46:07 Wolfinger, [name] ([id number]) -------------------------------------------------------------------------------- Problem List Details Patient Name: [name] Date of Service: 06/21/2018 10:30 AM Medical  Record Number: [id number] Patient Account Number: [id number] Date of Birth/Sex: 11-02-1923 (84 y.o. 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, stage 4 05/10/2018 No Yes I70.233 Atherosclerosis of native arteries of right leg with ulceration of 08/10/2017 No Yes ankle I89.0 Lymphedema, not elsewhere classified 08/10/2017 No Yes B35.4 Tinea corporis 09/28/2017 No Yes L89.523 Pressure ulcer of left ankle, stage 3 04/25/2018 No Yes Inactive Problems Resolved Problems Electronic Signature(s) Signed: 06/22/2018 1:32:06 AM By: Worthy Keeler PA-C Entered By: Worthy Keeler on 06/21/2018 10:38:46 Boze, [name] ([id number]) -------------------------------------------------------------------------------- Progress Note Details Patient Name: [name] Date of Service: 06/21/2018 10:30 AM Medical Record Number: [id number] Patient Account Number: [id number] Date of Birth/Sex: 1922/12/28 (84 y.o. 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 Dr. Amalia Hailey the podiatrist saw her on 07/20/2017 and sent her  to the wound center for possible hyperbaric oxygen therapy. past medical history of peripheral vascular  disease, varicose veins, status post appendectomy, basal cell carcinoma excision from the left leg, cholecystectomy, pacemaker placement, right lower extremity angiography done by Dr. dew in March 2017 with placement of a stent. there is also note of a successful ablation of the right small saphenous vein done which was reviewed by ultrasound on 10/24/2016. the patient had a right small saphenous vein ablation done on 10/20/2016. The patient has never been a smoker. She has been seen by Dr. Corene Cornea dew the vascular surgeon who most recently saw her on 06/15/2017 for evaluation of ongoing problems with right leg swelling. She had a lower extremity arterial duplex examination done(02/13/17) which showed patent distal right superficial femoral artery stent and above-the-knee popliteal stent without evidence of restenosis. The ABI was more than 1.3 on the right and more than 1.3 on the left. This was consistent with noncompressible arteries due to medial calcification. The right great toe pressure and PPG waveforms are within normal limits and the left great toe pressure and PPG waveforms are decreased. he recommended she continue to wear her compression stockings and continue with elevation. She is scheduled to have a noninvasive arterial study in the near future 08/16/2017 -- had a lower extremity arterial duplex examination done which showed patent distal right superficial femoral artery stent and above-the-knee popliteal stent without evidence of restenosis. The ABI was more than 1.3 on the right and more than 1.3 on the left. This was consistent with noncompressible arteries due to medial calcification. The right great toe pressure and PPG waveforms are within normal limits and the left great toe pressure and PPG waveforms are decreased. the x-ray of the right ankle has not yet been done 08/24/2017 -- had a right ankle x-ray -- IMPRESSION:1. No fracture, bone lesion or evidence of osteomyelitis. 2.  Lateral soft tissue swelling with a soft tissue ulcer. she has not yet seen the vascular surgeon for review 08/31/17 on evaluation today patient's wound appears to be showing signs of improvement. She still with her appointment with vascular in order to review her results of her vascular study and then determine if any intervention would be recommended at that time. No fevers, chills, nausea, or vomiting noted at this time. She has been tolerating the dressing changes without complication. 09/28/17 on evaluation today patient's wound appears to show signs of good improvement in regard to the granulation tissue which is surfacing. There is still a layer of slough covering the wound and the posterior portion is still significantly deeper than the anterior nonetheless there has been some good sign of things moving towards the better. She is going to go back to Dr. dew for reevaluation to ensure her blood flow is still appropriate. That will be before her next evaluation with [place] next week. No fevers, chills, nausea, or vomiting noted at this time. Patient does have some discomfort rated to be a 3-4/10 depending on activity specifically cleansing the wound makes it worse. 10/05/2017 -- the patient was seen by Dr. Lucky Cowboy last week and noninvasive studies showed a normal right ABI with brisk BRYNN, MULGREW. ([id number]) triphasic waveforms consistent with no arterial insufficiency including normal digital pressures. The duplex showed a patent distal right SFA stent and the proximal SFA was also normal. He was pleased with her test and thought she should have enough of perfusion for normal wound healing. He would see her back in 6 months time. 12/21/17  on evaluation today patient appears to be doing fairly well in regard to her right lateral ankle wound. Unfortunately the main issue that she is expansion at this point is that she is having some issues with what appears to be some cellulitis in the right  anterior shin. She has also been noting a little bit of uncomfortable feeling especially last night and her ankle area. I'm afraid that she made the developing a little bit of an infection. With that being said I think it is in the early stages. 12/28/17 on evaluation today patient's ankle appears to be doing excellent. She's making good progress at this point the cellulitis seems to have improved after last week's evaluation. Overall she is having no significant discomfort which is excellent news. She does have an appointment with Dr. dew on March 29, 2018 for reevaluation in regard to the stent he placed. She seems to have excellent blood flow in the right lower extremity. 01/19/12 on evaluation today patient's wound appears to be doing very well. In fact she does not appear to require debridement at this point, there's no evidence of infection, and overall from the standpoint of the wound she seems to be doing very well. With that being said I believe that it may be time to switch to different dressing away from the [facility] Dressing she tells me she does have a lot going on her friend actually passed away yesterday and she's also having a lot of issues with her husband this obviously is weighing heavy on her as far as your thoughts and concerns today. 01/25/18 on evaluation today patient appears to be doing fairly well in regard to her right lateral malleolus. She has been tolerating the dressing changes without complication. Overall I feel like this is definitely showing signs of improvement as far as how the overall appearance of the wound is there's also evidence of epithelium start to migrate over the granulation tissue. In general I think that she is progressing nicely as far as the wound is concerned. The only concern she really has is whether or not we can switch to every other week visits in order to avoid having as many appointments as her daughters have a difficult time getting her  to her appointments as well as the patient's husband to his he is not doing very well at this point. 02/22/18 on evaluation today patient's right lateral malleolus ulcer appears to be doing great. She has been tolerating the dressing changes without complication. Overall you making excellent progress at this time. Patient is having no significant discomfort. 03/15/18 on evaluation today patient appears to be doing much more poorly in regard to her right lateral ankle ulcer at this point. Unfortunately since have last seen her her husband has passed just a few days ago is obviously weighed heavily on her her daughter also had surgery well she is with her today as usual. There does not appear to be any evidence of infection she does seem to have significant contusion/deep tissue injury to the right lateral malleolus which was not noted previous when I saw her last. It's hard to tell of exactly when this injury occurred although during the time she was spending the night in the hospital this may have been most likely. 03/22/18 on evaluation today patient appears to actually be doing very well in regard to her ulcer. She did unfortunately have a setback which was noted last week however the good news is we seem to be getting back  on track and in fact the wound in the core did still have some necrotic tissue which will be addressed at this point today but in general I'm seeing signs that things are on the up and up. She is glad to hear this obviously she's been somewhat concerned that due to the how her wound digressed more recently. 03/29/18 on evaluation today patient appears to be doing fairly well in regard to her right lower extremity lateral malleolus ulcer. She unfortunately does have a new area of pressure injury over the inferior portion where the wound has opened up a little bit larger secondary to the pressure she seems to be getting. She does tell me sometimes when she sleeps at night that  it actually hurts and does seem to be pushing on the area little bit more unfortunately. There does not appear to be any evidence of infection which is good news. She has been tolerating the dressing changes without complication. She also did have some bruising in the left second and third toes due to the fact that she may have bump this or injured it although she has neuropathy so she does not feel she did move recently that may have been where this came from. Nonetheless there does not appear to be any evidence of infection at this time. 04/12/18 on evaluation today patient's wound on the right lateral ankle actually appears to be doing a little bit better with a lot of necrotic docking tissue centrally loosening up in clearing away. However she does have the beginnings of a deep tissue injury on the left lateral malleolus likely due to the fact we've been trying offload the right as much as we have. I think she may benefit from an assistive soft device to help with offloading and it looks like they're looking at one of the doughnut conditions that wraps around the lower leg to offload which I think will definitely do a good job. With that being said I think we definitely need to address this issue on the left before it becomes a wound. Patient is not having significant pain. HALLI, EQUIHUA ([id number]) 04/19/18 on evaluation today patient appears to be doing excellent in regard to the progress she's made with her right lateral ankle ulcer. The left ankle region which did show evidence of a deep tissue injury seems to be resolving there's little fluid noted underneath and a blister there's nothing open at this point in time overall I feel like this is progressing nicely which is good news. She does not seem to be having significant discomfort at this point which is also good news. 04/25/18-She is here in follow up evaluation for bilateral lateral malleolar ulcers. The right lateral malleolus  ulcer with pale subcutaneous tissue exposure, central area of ulcer with tendon/periosteum exposed. The left lateral malleolus ulcer now with central area of nonviable tissue, otherwise deep tissue injury. She is wearing compression wraps to the left lower extremity, she will place the right lower extremity compression wraps on when she gets home. She will be out of town over the weekend and return next week and follow-up appointment. She completed her doxycycline this morning 05/03/18 on evaluation today patient appears to be doing very well in regard to her right lateral ankle ulcer in general. At least she's showing some signs of improvement in this regard. Unfortunately she has some additional injury to the left lateral malleolus region which appears to be new likely even over the past several days. Again this determination  is based on the overall appearance. With that being said the patient is obviously frustrated about this currently. 05/10/18-She is here in follow-up evaluation for bilateral lateral malleolar ulcers. She states she has purchased offloading shoes/boots and they will arrive tomorrow. She was asked to bring them in the office at next week's appointment so her provider is aware of product being utilized. She continues to sleep on right or left side, she has been encouraged to sleep on her back. The right lateral malleolus ulcer is precariously close to peri-osteum; will order xray. The left lateral malleolus ulcer is improved. Will switch back to santyl; she will follow up next week. 05/17/18 on evaluation today patient actually appears to be doing very well in regard to her malleolus her ulcers compared to last time I saw them. She does not seem to have as much in the way of contusion at this point which is great news. With that being said she does continue to have discomfort and I do believe that she is still continuing to benefit from the offloading/pressure reducing boots that  were recommended. I think this is the key to trying to get this to heal up completely. 05/24/18 on evaluation today patient actually appears to be doing worse at this point in time unfortunately compared to her last week's evaluation. She is having really no increased pain which is good news unfortunately she does have more maceration in your theme and noted surrounding the right lateral ankle the left lateral ankle is not really is erythematous I do not see signs of the overt cellulitis on that side. Unfortunately the wounds do not seem to have shown any signs of improvement since the last evaluation. She also has significant swelling especially on the right compared to previous some of this may be due to infection however also think that she may be served better while she has these wounds by compression wrapping versus continuing to use the Juxta-Lite for the time being. Especially with the amount of drainage that she is experiencing at this point. No fevers, chills, nausea, or vomiting noted at this time. 05/31/18 on evaluation today patient appears to actually be doing better in regard to her right lateral lower extremity ulcer specifically on the malleolus region. She has been tolerating the antibiotic without complication. With that being said she still continues to have issues but a little bit of redness although nothing like she what she was experiencing previous. She still continues to pressure to her ankle area she did get the problem on offloading boots unfortunately she will not wear them she states there too uncomfortable and she can't get in and out of the bed. Nonetheless at this point her wounds seem to be continually getting worse which is not what we want I'm getting somewhat concerned about her progress and how things are going to proceed if we do not intervene in some way shape or form. I therefore had a very lengthy conversation today about offloading yet again and even made a  specific suggestion for switching her to a memory foam mattress and even gave the information for a specific one that they could look at getting if it was something that they were interested in considering. She does not want to be considered for a hospital bed air mattress although honestly insurance would not cover it that she does not have any wounds on her trunk. 06/14/18 on evaluation today both wounds over the bilateral lateral malleolus her ulcers appear to be doing better  there's no evidence of pressure injury at this point. She did get the foam mattress for her bed and this does seem to have been extremely beneficial for her in my pinion. Her daughter states that she is having difficulty getting out of bed because of how soft it is. The patient also relates this to be. Nonetheless I do feel like she's actually doing better. Unfortunately right after and around the time she was getting the mattress she also sustained a fall when she got up to go pick up the phone and ended up injuring her right elbow she has 18 sutures in place. We are not caring for this currently although home health is going to be taking the sutures out shortly. Nonetheless this may be something that we need to evaluate going forward. It depends on how well it has or has not healed in the end. She also recently saw an orthopedic specialist for an injection in the right shoulder just before her fall unfortunately the fall seems to have worsened her pain. 06/21/18 on evaluation today patient appears to be doing about the same in regard to her lateral malleolus ulcers. Both appear to be just a little bit deeper but again we are clinging away the necrotic and dead tissue which I think is why this is progressing towards a deeper realm as opposed to improving from my measurement standpoint in that regard. Nonetheless she has been tolerating the dressing changes she absolutely hates the memory foam mattress topper that was obtained  for her nonetheless Madelena, Maturin [name] ([id number]) I do believe this is still doing excellent as far as taking care of excess pressure in regard to the lateral malleolus regions. She in fact has no pressure injury that I see whereas in weeks past it was week by week I was constantly seeing new pressure injuries. Overall I think it has been very beneficial for her. Patient History Information obtained from Patient. Family History Cancer - Father,Siblings, Heart Disease - Siblings, No family history of Diabetes, Hereditary Spherocytosis, Hypertension, Kidney Disease, Lung Disease, Seizures, Stroke, Thyroid Problems, Tuberculosis. Social History Never smoker, Marital Status - Married, Alcohol Use - Never, Drug Use - No History, Caffeine Use - Rarely. Review of Systems (ROS) Constitutional Symptoms (General Health) Denies complaints or symptoms of Fever, Chills. Respiratory The patient has no complaints or symptoms. Cardiovascular The patient has no complaints or symptoms. Psychiatric The patient has no complaints or symptoms. Objective Constitutional Well-nourished and well-hydrated in no acute distress. Vitals Time Taken: 10:40 AM, Height: 65 in, Weight: 154.3 lbs, BMI: 25.7, Temperature: 98.1 F, Pulse: 89 bpm, Respiratory Rate: 16 breaths/min, Blood Pressure: 138/64 mmHg. Respiratory normal breathing without difficulty. clear to auscultation bilaterally. Cardiovascular regular rate and rhythm with normal S1, S2. Psychiatric this patient is able to make decisions and demonstrates good insight into disease process. Alert and Oriented x 3. pleasant and cooperative. General Notes: At this point the patient seems to be doing fairly well I did require sharp debridement today in order to clean away the necrotic tissue in the base of the wound. After discussion it was determined that the patient really would like to try utilizing her Juxta-Lite compression wraps which I think we can  definitely give this a try in that regard. Integumentary (Hair, Skin) Schepp, [name] ([id number]) Wound #1 status is Open. Original cause of wound was Gradually Appeared. The wound is located on the Right,Lateral Malleolus. The wound measures 1.6cm length x 1.6cm width x 0.5cm depth; 2.011cm^2 area  and 1.005cm^3 volume. There is Fat Layer (Subcutaneous Tissue) Exposed exposed. There is no tunneling or undermining noted. There is a large amount of purulent drainage noted. The wound margin is distinct with the outline attached to the wound base. There is no granulation within the wound bed. There is a large (67-100%) amount of necrotic tissue within the wound bed including Eschar and Adherent Slough. The periwound skin appearance exhibited: Dry/Scaly, Maceration, Rubor, Erythema. The periwound skin appearance did not exhibit: Callus, Crepitus, Excoriation, Induration, Rash, Scarring, Atrophie Blanche, Cyanosis, Ecchymosis, Hemosiderin Staining, Mottled, Pallor. The surrounding wound skin color is noted with erythema which is circumferential. Periwound temperature was noted as No Abnormality. The periwound has tenderness on palpation. Wound #2 status is Open. Original cause of wound was Gradually Appeared. The wound is located on the Left,Lateral Malleolus. The wound measures 1.6cm length x 1.5cm width x 0.4cm depth; 1.885cm^2 area and 0.754cm^3 volume. There is Fat Layer (Subcutaneous Tissue) Exposed exposed. There is no tunneling or undermining noted. There is a medium amount of purulent drainage noted. The wound margin is flat and intact. There is no granulation within the wound bed. There is a large (67-100%) amount of necrotic tissue within the wound bed including Adherent Slough. The periwound skin appearance exhibited: Dry/Scaly, Maceration, Erythema. The periwound skin appearance did not exhibit: Callus, Crepitus, Excoriation, Induration, Rash, Scarring, Atrophie Blanche, Cyanosis,  Ecchymosis, Hemosiderin Staining, Mottled, Pallor, Rubor. The surrounding wound skin color is noted with erythema which is circumferential. Periwound temperature was noted as No Abnormality. 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 There was a Excisional Skin/Subcutaneous Tissue Debridement with a total area of 2.56 sq cm performed by STONE III, Justise Ehmann E., PA-C. With the following instrument(s): Curette to remove Viable and Non-Viable tissue/material. Material removed includes Subcutaneous Tissue and Slough and after achieving pain control using Lidocaine 4% Topical Solution. No specimens were taken. A time out was conducted at 11:42, prior to the start of the procedure. A Minimum amount of bleeding was controlled with Pressure. The procedure was tolerated well with a pain level of 0 throughout and a pain level of 0 following the procedure. Patient s Level of Consciousness post procedure was recorded as Awake and Alert. Post Debridement Measurements: 1.6cm length x 1.6cm width x 0.6cm depth; 1.206cm^3 volume. Character of Wound/Ulcer Post Debridement is improved. Severity of Tissue Post Debridement is: Fat layer exposed. Post procedure Diagnosis Wound #1: Same as Pre-Procedure Wound #2 Pre-procedure diagnosis of Wound #2 is a Pressure Ulcer located on the Left,Lateral Malleolus . There was a Excisional Skin/Subcutaneous Tissue Debridement with a total area of 2.4 sq cm performed by STONE III, Janeen Watson E., PA-C. With the following instrument(s): Curette to remove Viable and Non-Viable tissue/material. Material removed includes  Subcutaneous [place], [name] ([id number]) Tissue and Slough and after achieving pain control using Lidocaine 4% Topical Solution. No specimens were taken. A time out was conducted at 11:40, prior to the start of the procedure. A Minimum amount of bleeding was controlled with Pressure. The procedure was tolerated well with a pain level of 0 throughout and a pain level of 0 following the procedure. Patient s Level of Consciousness post procedure was recorded as  Awake and Alert. Post Debridement Measurements: 1.6cm length x 1.5cm width x 0.5cm depth; 0.942cm^3 volume. Post debridement Stage noted as Category/Stage II. Character of Wound/Ulcer Post Debridement is improved. Post procedure Diagnosis Wound #2: Same as Pre-Procedure Plan Wound Cleansing: Wound #1 Right,Lateral Malleolus: Clean wound with Normal Saline. Cleanse wound with mild soap and water May Shower, gently pat wound dry prior to applying new dressing. Wound #2 Left,Lateral Malleolus: Clean wound with Normal Saline. Cleanse wound with mild soap and water May Shower, gently pat wound dry prior to applying new dressing. Anesthetic (add to Medication List): Wound #1 Right,Lateral Malleolus: Topical Lidocaine 4% cream applied to wound bed prior to debridement (In Clinic Only). Wound #2 Left,Lateral Malleolus: Topical Lidocaine 4% cream applied to wound bed prior to debridement (In Clinic Only). Primary Wound Dressing: Wound #1 Right,Lateral Malleolus: Silver Alginate Wound #2 Left,Lateral Malleolus: Silver Alginate Secondary Dressing: Wound #1 Right,Lateral Malleolus: Boardered Foam Dressing Wound #2 Left,Lateral Malleolus: Boardered Foam Dressing Dressing Change Frequency: Wound #1 Right,Lateral Malleolus: Change Dressing Monday, Wednesday, Friday Wound #2 Left,Lateral Malleolus: Change Dressing Monday, Wednesday, Friday Follow-up Appointments: Wound #1 Right,Lateral Malleolus: Return Appointment in 2 weeks. Wound  #2 Left,Lateral Malleolus: Return Appointment in 2 weeks. Edema Control: Wound #1 Right,Lateral Malleolus: Patient to wear own Velcro compression garment. Elevate legs to the level of the heart and pump ankles as often as possible Wound #2 Left,Lateral Malleolus: Patient to wear own Velcro compression garment. Elevate legs to the level of the heart and pump ankles as often as possible Off-Loading: Wound #1 Right,Lateral Malleolus: Turn and reposition every 2 hours [place], [name] ([id number]) Other: - do not put pressure on your right ankle Wound #2 Left,Lateral Malleolus: Turn and reposition every 2 hours Other: - do not put pressure on your right ankle Additional Orders / Instructions: Wound #1 Right,Lateral Malleolus: Increase protein intake. Other: - Add Vitamin C, Zinc, Vitamin A to your diet Wound #2 Left,Lateral Malleolus: Increase protein intake. Other: - Add Vitamin C, Zinc, Vitamin A to your diet Home Health: Wound #1 Right,Lateral Malleolus: [place] Visits - [place] Nurse may visit PRN to address patient s wound care needs. FACE TO FACE ENCOUNTER: MEDICARE and MEDICAID PATIENTS: I certify that this patient is under my care and that I had a face-to-face encounter that meets the physician face-to-face encounter requirements with this patient on this date. The encounter with the patient was in whole or in part for the following MEDICAL CONDITION: (primary reason for [place]) MEDICAL NECESSITY: I certify, that based on my findings, NURSING services are a medically necessary home health service. HOME BOUND STATUS: I certify that my clinical findings support that this patient is homebound (i.e., Due to illness or injury, pt requires aid of supportive devices such as crutches, cane, wheelchairs, walkers, the use of special transportation or the assistance of another person to leave their place of residence. There is a normal inability to  leave the home and doing so requires considerable and taxing effort. Other absences are for medical reasons / religious services and are infrequent or of short duration when for other reasons). If current dressing causes regression in wound condition, may D/C ordered dressing product/s and apply Normal Saline Moist Dressing daily until next [place] / Other MD appointment. [place] of regression in wound condition at [phone]. Please direct any NON-WOUND related issues/requests for orders to patient's Primary Care Physician Wound #2 Left,Lateral Malleolus: [place] Visits - [facility]  Home Health Nurse may visit PRN to address patient s wound care needs. FACE TO FACE ENCOUNTER: MEDICARE and MEDICAID PATIENTS: I certify that this patient is under my care and that I had a face-to-face encounter that meets the physician face-to-face encounter requirements with this patient on this date. The encounter with the patient was in whole or in part for the following MEDICAL CONDITION: (primary reason for [place]) MEDICAL NECESSITY: I certify, that based on my findings, NURSING services are a medically necessary home health service. HOME BOUND STATUS: I certify that my clinical findings support that this patient is homebound (i.e., Due to illness or injury, pt requires aid of supportive devices such as crutches, cane, wheelchairs, walkers, the use of special transportation or the assistance of another person to leave their place of residence. There is a normal inability to leave the home and doing so requires considerable and taxing effort. Other absences are for medical reasons / religious services and are infrequent or of short duration when for other reasons). If current dressing causes regression in wound condition, may D/C ordered dressing product/s and apply Normal Saline Moist Dressing daily until next [place] / Other MD appointment.  [place] of regression in wound condition at [phone]. Please direct any NON-WOUND related issues/requests for orders to patient's Primary Care Physician We will initiate the above wound care orders for the next week. In fact I will actually be seeing the patient back for a two week follow-up visit unless she has any concerns and needs to come back sooner. She's in agreement with this plan. We will plan to reevaluate and see were things stand at that point. Please see above for specific wound care orders. We will see patient for re-evaluation in 2 week(s) here in the clinic. If anything worsens or changes patient will contact our office for additional recommendations. Electronic Signature(s) Signed: 06/22/2018 1:32:06 AM By: Sherene Sires, [name] ([id number]) Entered By: Worthy Keeler on 06/21/2018 23:53:53 Joni Fears [name] ([id number]) -------------------------------------------------------------------------------- ROS/PFSH Details Patient Name: [name] Date of Service: 06/21/2018 10:30 AM Medical Record Number: [id number] Patient Account Number: [id number] Date of Birth/Sex: 1923-04-09 (84 y.o. F) Treating RN: Montey Hora Primary Care Provider: FITZGERALD, DAVID Other Clinician: Referring Provider: FITZGERALD, DAVID Treating Provider/Extender: STONE III, Sharesa Kemp Weeks in Treatment: 99 Information Obtained From Patient Wound History Do you currently have one or more open woundso Yes How many open wounds do you currently haveo 1 Approximately how long have you had your woundso 2 yrs How have you been treating your wound(s) until nowo mupirocin, soaking in epsom salt Has your wound(s) ever healed and then re-openedo No Have you had any lab work done in the past montho No Have you tested positive for an antibiotic resistant organism (MRSA, VRE)o No Have you tested positive for osteomyelitis (bone infection)o No Have you had any  tests for circulation on your legso Yes Who ordered the testo Dr. Lucky Cowboy [facility] test [facility] Constitutional Symptoms (General Health) Complaints and Symptoms: Negative for: Fever; Chills Eyes Medical History: Positive for: Cataracts - surgery Respiratory Complaints and Symptoms: No Complaints or Symptoms Cardiovascular Complaints and Symptoms: No Complaints or Symptoms Medical History: Positive for: Congestive Heart Failure; Hypertension Musculoskeletal Medical History: Positive for: Osteoarthritis Neurologic Medical History: Positive for: Neuropathy Oncologic MARKIE, HEFFERNAN ([id number]) Medical History: Negative for: Received Chemotherapy; Received Radiation Psychiatric Complaints and Symptoms: No Complaints or Symptoms HBO Extended History Items Eyes: Cataracts Immunizations  Pneumococcal Vaccine: Received Pneumococcal Vaccination: Yes Implantable Devices Family and Social History Cancer: Yes - Father,Siblings; Diabetes: No; Heart Disease: Yes - Siblings; Hereditary Spherocytosis: No; Hypertension: No; Kidney Disease: No; Lung Disease: No; Seizures: No; Stroke: No; Thyroid Problems: No; Tuberculosis: No; Never smoker; Marital Status - Married; Alcohol Use: Never; Drug Use: No History; Caffeine Use: Rarely; Financial Concerns: No; Food, Clothing or Shelter Needs: No; Support System Lacking: No; Transportation Concerns: No; Advanced Directives: No; Patient does not want information on Advanced Directives; Do not resuscitate: No; Living Will: Yes (Not Provided); Medical Power of Attorney: No Physician Affirmation I have reviewed and agree with the above information. Electronic Signature(s) Signed: 06/22/2018 1:32:06 AM By: Worthy Keeler PA-C Signed: 06/24/2018 2:22:32 PM By: Montey Hora Entered By: Worthy Keeler on 06/21/2018 23:52:31 [place], [name]  ([id number]) -------------------------------------------------------------------------------- SuperBill Details Patient Name: [name] Date of Service: 06/21/2018 Medical Record Number: [id number] Patient Account Number: [id number] Date of Birth/Sex: 1923-10-05 (84 y.o. 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 I89.0 Lymphedema, not elsewhere classified B35.4 Tinea corporis L89.523 Pressure ulcer of left ankle, stage 3 Facility Procedures CPT4 Code: 11572620 Description: 35597 - DEB SUBQ TISSUE 20 SQ CM/< ICD-10 Diagnosis Description 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, stage 4 L89.523 Pressure ulcer of left ankle, stage 3 Modifier: Quantity: 1 Physician Procedures CPT4 Code: 4163845 Description: 36468 - WC PHYS SUBQ TISS 20 SQ CM ICD-10 Diagnosis Description 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, stage 4 L89.523 Pressure ulcer of left ankle, stage 3 Modifier: Quantity: 1 Electronic Signature(s) Signed: 06/22/2018 1:32:06 AM By: Worthy Keeler PA-C Entered By: Worthy Keeler on 06/21/2018 23:54:08

## 2018-06-25 NOTE — Progress Notes (Signed)
SHAYLEA, [facility] ([id number]) Visit Report for 06/21/2018 Arrival Information Details Patient Name: [name], [name] Date of Service: 06/21/2018 10:30 AM Medical Record Number: [id number] Patient Account Number: [id number] Date of Birth/Sex: 07/05/1923 (84 y.o. F) Treating RN: Secundino Ginger Primary Care Marshelle Bilger: FITZGERALD, DAVID Other Clinician: Referring Huyen Perazzo: FITZGERALD, DAVID Treating Bergen Magner/Extender: STONE III, HOYT Weeks in Treatment: 67 Visit Information History Since Last Visit Added or deleted any medications: No Patient Arrived: Ambulatory Any new allergies or adverse reactions: No Arrival Time: 10:38 Had a fall or experienced change in No Accompanied By: dtr activities of daily living that may affect Transfer Assistance: None risk of falls: Patient Identification Verified: Yes Signs or symptoms of abuse/neglect since last visito No Secondary Verification Process Yes Hospitalized since last visit: No Completed: Implantable device outside of the clinic excluding No Patient Requires Transmission-Based No cellular tissue based products placed in the center Precautions: since last visit: Patient Has Alerts: Yes Has Dressing in Place as Prescribed: Yes Patient Alerts: ABI AVVS 03/29/18 L Pain Present Now: No 1.05 R 1.03, TBI L .56, R .85 Electronic Signature(s) Signed: 06/21/2018 3:03:32 PM By: Secundino Ginger Entered By: Secundino Ginger on 06/21/2018 10:40:54 Cecena, [name] ([id number]) -------------------------------------------------------------------------------- Encounter Discharge Information Details Patient Name: [name] Date of Service: 06/21/2018 10:30 AM Medical Record Number: [id number] Patient Account Number: [id number] Date of Birth/Sex: 01/16/1923 (84 y.o. F) Treating RN: Ahmed Prima Primary Care Kaileia Flow: FITZGERALD, DAVID Other Clinician: Referring Shandy Vi: FITZGERALD, DAVID Treating Priscila Bean/Extender: STONE III, HOYT Weeks in  Treatment: 65 Encounter Discharge Information Items Discharge Condition: Stable Ambulatory Status: Ambulatory Discharge Destination: Home Transportation: Private Auto Accompanied By: daughter Schedule Follow-up Appointment: Yes Clinical Summary of Care: Electronic Signature(s) Signed: 06/21/2018 12:06:00 PM By: Alric Quan Entered By: Alric Quan on 06/21/2018 12:05:59 Troiani, [name] ([id number]) -------------------------------------------------------------------------------- Lower Extremity Assessment Details Patient Name: [name] Date of Service: 06/21/2018 10:30 AM Medical Record Number: [id number] Patient Account Number: [id number] Date of Birth/Sex: 11-11-1923 (84 y.o. F) Treating RN: Secundino Ginger Primary Care Jonny Longino: FITZGERALD, DAVID Other Clinician: Referring Bassel Gaskill: FITZGERALD, DAVID Treating Malonie Tatum/Extender: STONE III, HOYT Weeks in Treatment: 45 Edema Assessment Assessed: [Left: No] [Right: No] [Left: Edema] [Right: :] Calf Left: Right: Point of Measurement: 29 cm From Medial Instep 30 cm 32.5 cm Ankle Left: Right: Point of Measurement: 12 cm From Medial Instep 19.5 cm 20 cm Vascular Assessment Claudication: Claudication Assessment [Left:None] [Right:None] Pulses: Dorsalis Pedis Palpable: [Left:Yes] [Right:Yes] Posterior Tibial Extremity colors, hair growth, and conditions: Extremity Color: [Left:Hyperpigmented] [Right:Hyperpigmented] Hair Growth on Extremity: [Left:No] [Right:No] Temperature of Extremity: [Left:Warm] [Right:Warm] Capillary Refill: [Left:< 3 seconds] [Right:< 3 seconds] Toe Nail Assessment Left: Right: Thick: Yes Yes Discolored: Yes Yes Deformed: Yes Yes Improper Length and Hygiene: No No Electronic Signature(s) Signed: 06/21/2018 3:03:32 PM By: Secundino Ginger Entered By: Secundino Ginger on 06/21/2018 10:58:22 Humann, [name]  ([id number]) -------------------------------------------------------------------------------- Multi Wound Chart Details Patient Name: [name] Date of Service: 06/21/2018 10:30 AM Medical Record Number: [id number] Patient Account Number: [id number] Date of Birth/Sex: September 23, 1923 (84 y.o. F) Treating RN: Montey Hora Primary Care Niya Behler: FITZGERALD, DAVID Other Clinician: Referring Akela Pocius: FITZGERALD, DAVID Treating Richmond Coldren/Extender: STONE III, HOYT Weeks in Treatment: 45 Vital Signs Height(in): 65 Pulse(bpm): 89 Weight(lbs): 154.3 Blood Pressure(mmHg): 138/64 Body Mass Index(BMI): 26 Temperature(F): 98.1 Respiratory Rate 16 (breaths/min): Photos: [N/A:N/A] Wound Location: Right Malleolus - Lateral Left Malleolus - Lateral N/A Wounding Event: Gradually Appeared Gradually Appeared N/A Primary Etiology: Lymphedema Pressure Ulcer N/A Secondary Etiology: Arterial Insufficiency Ulcer N/A N/A Comorbid  History: Cataracts, Congestive Heart Cataracts, Congestive Heart N/A Failure, Hypertension, Failure, Hypertension, Osteoarthritis, Neuropathy Osteoarthritis, Neuropathy Date Acquired: 08/11/2015 04/12/2018 N/A Weeks of Treatment: 45 9 N/A Wound Status: Open Open N/A Measurements L x W x D 1.6x1.6x0.5 1.6x1.5x0.4 N/A (cm) Area (cm) : 2.011 1.885 N/A Volume (cm) : 1.005 0.754 N/A % Reduction in Area: -31.30% -140.10% N/A % Reduction in Volume: -119.00% -854.40% N/A Classification: Full Thickness With Exposed Category/Stage II N/A Support Structures Exudate Amount: Large Medium N/A Exudate Type: Purulent Purulent N/A Exudate Color: yellow, brown, green yellow, brown, green N/A Wound Margin: Distinct, outline attached Flat and Intact N/A Granulation Amount: None Present (0%) None Present (0%) N/A Necrotic Amount: Large (67-100%) Large (67-100%) N/A Necrotic Tissue: Eschar, Adherent [place] N/A Exposed Structures: Fat Layer (Subcutaneous Fat Layer  (Subcutaneous N/A Tissue) Exposed: Yes Tissue) Exposed: Yes Fascia: No Fascia: No Tendon: No Tendon: No Bucknam, [name] ([id number]) Muscle: No Muscle: No Joint: No Joint: No Bone: No Bone: No Epithelialization: None None N/A Periwound Skin Texture: Excoriation: No Excoriation: No N/A Induration: No Induration: No Callus: No Callus: No Crepitus: No Crepitus: No Rash: No Rash: No Scarring: No Scarring: No Periwound Skin Moisture: Maceration: Yes Maceration: Yes N/A Dry/Scaly: Yes Dry/Scaly: Yes Periwound Skin Color: Erythema: Yes Erythema: Yes N/A Rubor: Yes Atrophie Blanche: No Atrophie Blanche: No Cyanosis: No Cyanosis: No Ecchymosis: No Ecchymosis: No Hemosiderin Staining: No Hemosiderin Staining: No Mottled: No Mottled: No Pallor: No Pallor: No Rubor: No Erythema Location: Circumferential Circumferential N/A Temperature: No Abnormality No Abnormality N/A Tenderness on Palpation: Yes No N/A Wound Preparation: Ulcer Cleansing: Ulcer Cleansing: N/A Rinsed/Irrigated with Saline Rinsed/Irrigated with Saline Topical Anesthetic Applied: Topical Anesthetic Applied: Other: lidocaine 4% Other: lidocaine 4% Treatment Notes Electronic Signature(s) Signed: 06/24/2018 2:22:32 PM By: Montey Hora Entered By: Montey Hora on 06/21/2018 11:38:29 Condron, [name] ([id number]) -------------------------------------------------------------------------------- [place] Details Patient Name: [name] Date of Service: 06/21/2018 10:30 AM Medical Record Number: [id number] Patient Account Number: [id number] Date of Birth/Sex: 10-Apr-1923 (84 y.o. F) Treating RN: Montey Hora Primary Care Cowen Pesqueira: FITZGERALD, DAVID Other Clinician: Referring Winton Offord: FITZGERALD, DAVID Treating Alphonza Tramell/Extender: STONE III, HOYT Weeks in Treatment: 45 Active Inactive ` Abuse / Safety / Falls / Self Care Management Nursing  Diagnoses: Potential for falls Goals: Patient will not experience any injury related to falls Date Initiated: 08/10/2017 Target Resolution Date: 11/10/2017 Goal Status: Active Interventions: Assess Activities of Daily Living upon admission and as needed Assess fall risk on admission and as needed Assess: immobility, friction, shearing, incontinence upon admission and as needed Notes: ` Nutrition Nursing Diagnoses: Imbalanced nutrition Potential for alteratiion in Nutrition/Potential for imbalanced nutrition Goals: Patient/caregiver agrees to and verbalizes understanding of need to use nutritional supplements and/or vitamins as prescribed Date Initiated: 08/10/2017 Target Resolution Date: 12/08/2017 Goal Status: Active Interventions: Assess patient nutrition upon admission and as needed per policy Notes: ` Orientation to the Wound Care Program Nursing Diagnoses: Knowledge deficit related to the wound healing center program Goals: Patient/caregiver will verbalize understanding of the [facility] Date Initiated: 08/10/2017 Target Resolution Date: 09/08/2017 CHARLITA, BRIAN ([id number]) Goal Status: Active Interventions: Provide education on orientation to the wound center Notes: ` Pain, Acute or Chronic Nursing Diagnoses: Pain, acute or chronic: actual or potential Potential alteration in comfort, pain Goals: Patient/caregiver will verbalize adequate pain control between visits Date Initiated: 08/10/2017 Target Resolution Date: 12/08/2017 Goal Status: Active Interventions: Complete pain assessment as per visit requirements Notes: ` Wound/Skin Impairment Nursing Diagnoses: Impaired  tissue integrity Knowledge deficit related to ulceration/compromised skin integrity Goals: Ulcer/skin breakdown will have a volume reduction of 80% by week 12 Date Initiated: 08/10/2017 Target Resolution Date: 12/01/2017 Goal Status: Active Interventions: Assess  patient/caregiver ability to perform ulcer/skin care regimen upon admission and as needed Notes: Electronic Signature(s) Signed: 06/24/2018 2:22:32 PM By: Montey Hora Entered By: Montey Hora on 06/21/2018 11:38:09 Vivian, [name] ([id number]) -------------------------------------------------------------------------------- Pain Assessment Details Patient Name: [name] Date of Service: 06/21/2018 10:30 AM Medical Record Number: [id number] Patient Account Number: [id number] Date of Birth/Sex: [birth year] (84 y.o. F) Treating RN: Secundino Ginger Primary Care [place] Hair: FITZGERALD, DAVID Other Clinician: Referring Lucerito Rosinski: FITZGERALD, DAVID Treating Steffie Waggoner/Extender: STONE III, HOYT Weeks in Treatment: 45 Active Problems Location of Pain Severity and Description of Pain Patient Has Paino No Site Locations Pain Management and Medication Current Pain Management: Goals for Pain Management Topical or injectable lidocaine is offered to patient for acute pain when surgical debridement is performed. If needed, Patient is instructed to use over the counter pain medication for the following 24-48 hours after debridement. Wound care MDs do not prescribed pain medications. Patient has chronic pain or uncontrolled pain. Patient has been instructed to make an appointment with their Primary Care Physician for pain management. Electronic Signature(s) Signed: 06/21/2018 3:03:32 PM By: Secundino Ginger Entered By: Secundino Ginger on 06/21/2018 10:41:51 Pultz, [name] ([id number]) -------------------------------------------------------------------------------- Patient/Caregiver Education Details Patient Name: [name] Date of Service: 06/21/2018 10:30 AM Medical Record Number: [id number] Patient Account Number: [id number] Date of Birth/Gender: 11/19/1923 (84 y.o. F) Treating RN: Ahmed Prima Primary Care Physician: FITZGERALD, DAVID Other Clinician: Referring Physician: FITZGERALD,  DAVID Treating Physician/Extender: Melburn Hake, HOYT Weeks in Treatment: 60 Education Assessment Education Provided To: Patient Education Topics Provided Wound/Skin Impairment: Handouts: Caring for Your Ulcer, Skin Care Do's and Dont's, Other: change dressing as ordered Methods: Demonstration, Explain/Verbal Responses: State content correctly Electronic Signature(s) Signed: 06/24/2018 5:20:31 PM By: Alric Quan Entered By: Alric Quan on 06/21/2018 12:06:15 Frees, [name] ([id number]) -------------------------------------------------------------------------------- Wound Assessment Details Patient Name: [name] Date of Service: 06/21/2018 10:30 AM Medical Record Number: [id number] Patient Account Number: [id number] Date of Birth/Sex: Nov 26, 1923 (84 y.o. F) Treating RN: Secundino Ginger Primary Care Kyrene Longan: FITZGERALD, DAVID Other Clinician: Referring Miriya Cloer: FITZGERALD, DAVID Treating Sharri Loya/Extender: STONE III, HOYT Weeks in Treatment: 45 Wound Status Wound Number: 1 Primary Lymphedema Etiology: Wound Location: Right Malleolus - Lateral Secondary Arterial Insufficiency Ulcer Wounding Event: Gradually Appeared Etiology: Date Acquired: 08/11/2015 Wound Status: Open Weeks Of Treatment: 45 Comorbid Cataracts, Congestive Heart Failure, Clustered Wound: No History: Hypertension, Osteoarthritis, Neuropathy Photos Photo Uploaded By: Secundino Ginger on 06/21/2018 11:03:38 Wound Measurements Length: (cm) 1.6 % Reduction Width: (cm) 1.6 % Reduction Depth: (cm) 0.5 Epitheliali Area: (cm) 2.011 Tunneling: Volume: (cm) 1.005 Underminin in Area: -31.3% in Volume: -119% zation: None No g: No Wound Description Full Thickness With Exposed Support Foul Odor A Classification: Structures Slough/Fibr Wound Margin: Distinct, outline attached Exudate Large Amount: Exudate Type: Purulent Exudate Color: yellow, brown, green fter Cleansing: No ino Yes Wound  Bed Granulation Amount: None Present (0%) Exposed Structure Necrotic Amount: Large (67-100%) Fascia Exposed: No Necrotic Quality: Eschar, Adherent Slough Fat Layer (Subcutaneous Tissue) Exposed: Yes Tendon Exposed: No Muscle Exposed: No Joint Exposed: No Bone Exposed: No Dimmitt, [name] ([id number]) Periwound Skin Texture Texture Color No Abnormalities Noted: No No Abnormalities Noted: No Callus: No Atrophie Blanche: No Crepitus: No Cyanosis: No Excoriation: No Ecchymosis: No Induration: No Erythema: Yes Rash: No Erythema Location: Circumferential Scarring:  No Hemosiderin Staining: No Mottled: No Moisture Pallor: No No Abnormalities Noted: No Rubor: Yes Dry / Scaly: Yes Maceration: Yes Temperature / Pain Temperature: No Abnormality Tenderness on Palpation: Yes Wound Preparation Ulcer Cleansing: Rinsed/Irrigated with Saline Topical Anesthetic Applied: Other: lidocaine 4%, Treatment Notes Wound #1 (Right, Lateral Malleolus) 1. Cleansed with: Clean wound with Normal Saline Cleanse wound with antibacterial soap and water 2. Anesthetic Topical Lidocaine 4% cream to wound bed prior to debridement 3. Peri-wound Care: Skin Prep 4. Dressing Applied: Other dressing (specify in notes) 5. Secondary Dressing Applied Bordered Foam Dressing 7. Secured with Tubigrip Notes silvercel Electronic Signature(s) Signed: 06/21/2018 3:03:32 PM By: Secundino Ginger Entered By: Secundino Ginger on 06/21/2018 10:53:41 Caloca, [name] ([id number]) -------------------------------------------------------------------------------- Wound Assessment Details Patient Name: [name] Date of Service: 06/21/2018 10:30 AM Medical Record Number: [id number] Patient Account Number: [id number] Date of Birth/Sex: 1923/04/29 (84 y.o. F) Treating RN: Secundino Ginger Primary Care Rogena Deupree: FITZGERALD, DAVID Other Clinician: Referring Kmari Brian: FITZGERALD, DAVID Treating Chantea Surace/Extender: STONE III,  HOYT Weeks in Treatment: 45 Wound Status Wound Number: 2 Primary Pressure Ulcer Etiology: Wound Location: Left Malleolus - Lateral Wound Open Wounding Event: Gradually Appeared Status: Date Acquired: 04/12/2018 Comorbid Cataracts, Congestive Heart Failure, Weeks Of Treatment: 9 History: Hypertension, Osteoarthritis, Neuropathy Clustered Wound: No Photos Photo Uploaded By: Secundino Ginger on 06/21/2018 11:03:39 Wound Measurements Length: (cm) 1.6 Width: (cm) 1.5 Depth: (cm) 0.4 Area: (cm) 1.885 Volume: (cm) 0.754 % Reduction in Area: -140.1% % Reduction in Volume: -854.4% Epithelialization: None Tunneling: No Undermining: No Wound Description Classification: Category/Stage II Foul Odor A Wound Margin: Flat and Intact Slough/Fibr Exudate Amount: Medium Exudate Type: Purulent Exudate Color: yellow, brown, green fter Cleansing: No ino Yes Wound Bed Granulation Amount: None Present (0%) Exposed Structure Necrotic Amount: Large (67-100%) Fascia Exposed: No Necrotic Quality: Adherent Slough Fat Layer (Subcutaneous Tissue) Exposed: Yes Tendon Exposed: No Muscle Exposed: No Joint Exposed: No Bone Exposed: No Periwound Skin Texture Maffia, [name] ([id number]) Texture Color No Abnormalities Noted: No No Abnormalities Noted: No Callus: No Atrophie Blanche: No Crepitus: No Cyanosis: No Excoriation: No Ecchymosis: No Induration: No Erythema: Yes Rash: No Erythema Location: Circumferential Scarring: No Hemosiderin Staining: No Mottled: No Moisture Pallor: No No Abnormalities Noted: No Rubor: No Dry / Scaly: Yes Maceration: Yes Temperature / Pain Temperature: No Abnormality Wound Preparation Ulcer Cleansing: Rinsed/Irrigated with Saline Topical Anesthetic Applied: Other: lidocaine 4%, Treatment Notes Wound #2 (Left, Lateral Malleolus) 1. Cleansed with: Clean wound with Normal Saline Cleanse wound with antibacterial soap and water 2. Anesthetic Topical  Lidocaine 4% cream to wound bed prior to debridement 3. Peri-wound Care: Skin Prep 4. Dressing Applied: Other dressing (specify in notes) 5. Secondary Dressing Applied Bordered Foam Dressing 7. Secured with Tubigrip Notes silvercel Electronic Signature(s) Signed: 06/21/2018 3:03:32 PM By: Secundino Ginger Entered By: Secundino Ginger on 06/21/2018 10:55:18 Couillard, [name] ([id number]) -------------------------------------------------------------------------------- [place] Details Patient Name: [name] Date of Service: 06/21/2018 10:30 AM Medical Record Number: [id number] Patient Account Number: [id number] Date of Birth/Sex: May 03, 1923 (84 y.o. F) Treating RN: Secundino Ginger Primary Care Tiny Chaudhary: FITZGERALD, DAVID Other Clinician: Referring Kobee Medlen: FITZGERALD, DAVID Treating Rayanne Padmanabhan/Extender: STONE III, HOYT Weeks in Treatment: 45 Vital Signs Time Taken: 10:40 Temperature (F): 98.1 Height (in): 65 Pulse (bpm): 89 Weight (lbs): 154.3 Respiratory Rate (breaths/min): 16 Body Mass Index (BMI): 25.7 Blood Pressure (mmHg): 138/64 Reference Range: 80 - 120 mg / dl Electronic Signature(s) Signed: 06/21/2018 3:03:32 PM By: Secundino Ginger Entered By: Secundino Ginger on 06/21/2018 10:42:19

## 2018-07-03 ENCOUNTER — Encounter: Payer: Medicare Other | Admitting: Internal Medicine

## 2018-07-03 DIAGNOSIS — L89514 Pressure ulcer of right ankle, stage 4: Secondary | ICD-10-CM | POA: Diagnosis not present

## 2018-07-10 ENCOUNTER — Telehealth (INDEPENDENT_AMBULATORY_CARE_PROVIDER_SITE_OTHER): Payer: Self-pay | Admitting: Vascular Surgery

## 2018-07-10 ENCOUNTER — Other Ambulatory Visit: Payer: Self-pay | Admitting: Nurse Practitioner

## 2018-07-10 ENCOUNTER — Encounter: Payer: Medicare Other | Attending: Nurse Practitioner | Admitting: Nurse Practitioner

## 2018-07-10 DIAGNOSIS — I509 Heart failure, unspecified: Secondary | ICD-10-CM | POA: Insufficient documentation

## 2018-07-10 DIAGNOSIS — M1990 Unspecified osteoarthritis, unspecified site: Secondary | ICD-10-CM | POA: Insufficient documentation

## 2018-07-10 DIAGNOSIS — L089 Local infection of the skin and subcutaneous tissue, unspecified: Secondary | ICD-10-CM

## 2018-07-10 DIAGNOSIS — I890 Lymphedema, not elsewhere classified: Secondary | ICD-10-CM | POA: Insufficient documentation

## 2018-07-10 DIAGNOSIS — T148XXA Other injury of unspecified body region, initial encounter: Principal | ICD-10-CM

## 2018-07-10 DIAGNOSIS — I771 Stricture of artery: Secondary | ICD-10-CM | POA: Insufficient documentation

## 2018-07-10 DIAGNOSIS — G629 Polyneuropathy, unspecified: Secondary | ICD-10-CM | POA: Insufficient documentation

## 2018-07-10 DIAGNOSIS — I110 Hypertensive heart disease with heart failure: Secondary | ICD-10-CM | POA: Diagnosis not present

## 2018-07-10 DIAGNOSIS — L89524 Pressure ulcer of left ankle, stage 4: Secondary | ICD-10-CM | POA: Insufficient documentation

## 2018-07-12 ENCOUNTER — Ambulatory Visit
Admission: RE | Admit: 2018-07-12 | Discharge: 2018-07-12 | Disposition: A | Discharge status: Home / Self Care | Payer: Medicare Other | Source: Ambulatory Visit | Attending: Nurse Practitioner | Admitting: Nurse Practitioner

## 2018-07-12 ENCOUNTER — Ambulatory Visit
Admission: RE | Admit: 2018-07-12 | Discharge: 2018-07-12 | Disposition: A | Discharge status: Home / Self Care | Payer: Medicare Other | Source: Ambulatory Visit | Attending: Podiatry | Admitting: Podiatry

## 2018-07-12 DIAGNOSIS — L089 Local infection of the skin and subcutaneous tissue, unspecified: Secondary | ICD-10-CM

## 2018-07-12 DIAGNOSIS — L97312 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d: Secondary | ICD-10-CM

## 2018-07-12 DIAGNOSIS — M7989 Other specified soft tissue disorders: Secondary | ICD-10-CM | POA: Diagnosis not present

## 2018-07-12 DIAGNOSIS — T148XXA Other injury of unspecified body region, initial encounter: Principal | ICD-10-CM

## 2018-07-12 DIAGNOSIS — L97329 Non-pressure chronic ulcer of left ankle with unspecified severity: Secondary | ICD-10-CM | POA: Diagnosis present

## 2018-07-12 NOTE — Telephone Encounter (Signed)
Patient has a upcoming appointment in the office on 07/23/18

## 2018-07-14 NOTE — Progress Notes (Signed)
[name] ([id number]) Visit Report for 07/03/2018 HPI Details Patient Name: [name], [name]. Date of Service: 07/03/2018 11:15 AM Medical Record Number: [id number] Patient Account Number: [id number] Date of Birth/Sex: 1923-04-13 (84 y.o. 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 Dr. Amalia Hailey the podiatrist saw her on 07/20/2017 and sent her to the wound center for possible hyperbaric oxygen therapy. past medical history of peripheral vascular disease, varicose veins, status post appendectomy, basal cell carcinoma excision from the left leg, cholecystectomy, pacemaker placement, right lower extremity angiography done by Dr. dew in March 2017 with placement of a stent. there is also note of a successful ablation of the right small saphenous vein done which was reviewed by ultrasound on 10/24/2016. the patient had a right small saphenous vein ablation done on 10/20/2016. The patient has never been a smoker. She has been seen by Dr. Corene Cornea dew the vascular surgeon who most recently saw her on 06/15/2017 for evaluation of ongoing problems with right leg swelling. She had a lower extremity arterial duplex examination done(02/13/17) which showed patent distal right superficial femoral artery stent and above-the-knee popliteal stent without evidence of restenosis. The ABI was more than 1.3 on the right and more than 1.3 on the left. This was consistent with noncompressible arteries due to medial calcification. The right great toe pressure and PPG waveforms are within normal limits and the left great toe  pressure and PPG waveforms are decreased. he recommended she continue to wear her compression stockings and continue with elevation. She is scheduled to have a noninvasive arterial study in the near future 08/16/2017 -- had a lower extremity arterial duplex examination done which showed patent distal right superficial femoral artery stent and above-the-knee popliteal stent without evidence of restenosis. The ABI was more than 1.3 on the right and more than 1.3 on the left. This was consistent with noncompressible arteries due to medial calcification. The right great toe pressure and PPG waveforms are within normal limits and the left great toe pressure and PPG waveforms are decreased. the x-ray of the right ankle has not yet been done 08/24/2017 -- had a right ankle x-ray -- IMPRESSION:1. No fracture, bone lesion or evidence of osteomyelitis. 2. Lateral soft tissue swelling with a soft tissue ulcer. she has not yet seen the vascular surgeon for review 08/31/17 on evaluation today patient's wound appears to be showing signs of improvement. She still with her appointment with vascular in order to review her results of her vascular study and then determine if any intervention would be recommended at that time. No fevers, chills, nausea, or vomiting noted at this time. She has been tolerating the dressing changes without complication. 09/28/17 on evaluation today patient's wound appears to show signs of good improvement in regard to the granulation tissue which is surfacing. There is still a layer of slough covering the wound and the posterior portion is still significantly deeper than the anterior nonetheless there has been some good sign of things moving towards the better. She is going to go back to Dr. dew for reevaluation to ensure her blood flow is still appropriate. That will be before her next evaluation with [place] next week. No fevers, chills, nausea,  or vomiting noted at this time. Patient does  have some discomfort rated to be a 3-4/10 depending on activity specifically cleansing the wound makes it worse. 10/05/2017 -- the patient was seen by Dr. Lucky Cowboy last week and noninvasive studies showed a normal right ABI with brisk triphasic waveforms consistent with no arterial insufficiency including normal digital pressures. The duplex showed a patent distal right SFA stent and the proximal SFA was also normal. He was pleased with her test and thought she should have enough of perfusion for normal wound healing. He would see her back in 6 months time. [name], [name] ([id number]) 12/21/17 on evaluation today patient appears to be doing fairly well in regard to her right lateral ankle wound. Unfortunately the main issue that she is expansion at this point is that she is having some issues with what appears to be some cellulitis in the right anterior shin. She has also been noting a little bit of uncomfortable feeling especially last night and her ankle area. I'm afraid that she made the developing a little bit of an infection. With that being said I think it is in the early stages. 12/28/17 on evaluation today patient's ankle appears to be doing excellent. She's making good progress at this point the cellulitis seems to have improved after last week's evaluation. Overall she is having no significant discomfort which is excellent news. She does have an appointment with Dr. dew on March 29, 2018 for reevaluation in regard to the stent he placed. She seems to have excellent blood flow in the right lower extremity. 01/19/12 on evaluation today patient's wound appears to be doing very well. In fact she does not appear to require debridement at this point, there's no evidence of infection, and overall from the standpoint of the wound she seems to be doing very well. With that being said I believe that it may be time to switch to different dressing away from the [facility] Dressing she tells me she  does have a lot going on her friend actually passed away yesterday and she's also having a lot of issues with her husband this obviously is weighing heavy on her as far as your thoughts and concerns today. 01/25/18 on evaluation today patient appears to be doing fairly well in regard to her right lateral malleolus. She has been tolerating the dressing changes without complication. Overall I feel like this is definitely showing signs of improvement as far as how the overall appearance of the wound is there's also evidence of epithelium start to migrate over the granulation tissue. In general I think that she is progressing nicely as far as the wound is concerned. The only concern she really has is whether or not we can switch to every other week visits in order to avoid having as many appointments as her daughters have a difficult time getting her to her appointments as well as the patient's husband to his he is not doing very well at this point. 02/22/18 on evaluation today patient's right lateral malleolus ulcer appears to be doing great. She has been tolerating the dressing changes without complication. Overall you making excellent progress at this time. Patient is having no significant discomfort. 03/15/18 on evaluation today patient appears to be doing much more poorly in regard to her right lateral ankle ulcer at this point. Unfortunately since have last seen her her husband has passed just a few days ago is obviously weighed heavily on her her daughter also had surgery well she is  with her today as usual. There does not appear to be any evidence of infection she does seem to have significant contusion/deep tissue injury to the right lateral malleolus which was not noted previous when I saw her last. It's hard to tell of exactly when this injury occurred although during the time she was spending the night in the hospital this may have been most likely. 03/22/18 on evaluation today patient appears  to actually be doing very well in regard to her ulcer. She did unfortunately have a setback which was noted last week however the good news is we seem to be getting back on track and in fact the wound in the core did still have some necrotic tissue which will be addressed at this point today but in general I'm seeing signs that things are on the up and up. She is glad to hear this obviously she's been somewhat concerned that due to the how her wound digressed more recently. 03/29/18 on evaluation today patient appears to be doing fairly well in regard to her right lower extremity lateral malleolus ulcer. She unfortunately does have a new area of pressure injury over the inferior portion where the wound has opened up a little bit larger secondary to the pressure she seems to be getting. She does tell me sometimes when she sleeps at night that it actually hurts and does seem to be pushing on the area little bit more unfortunately. There does not appear to be any evidence of infection which is good news. She has been tolerating the dressing changes without complication. She also did have some bruising in the left second and third toes due to the fact that she may have bump this or injured it although she has neuropathy so she does not feel she did move recently that may have been where this came from. Nonetheless there does not appear to be any evidence of infection at this time. 04/12/18 on evaluation today patient's wound on the right lateral ankle actually appears to be doing a little bit better with a lot of necrotic docking tissue centrally loosening up in clearing away. However she does have the beginnings of a deep tissue injury on the left lateral malleolus likely due to the fact we've been trying offload the right as much as we have. I think she may benefit from an assistive soft device to help with offloading and it looks like they're looking at one of the doughnut conditions that wraps around  the lower leg to offload which I think will definitely do a good job. With that being said I think we definitely need to address this issue on the left before it becomes a wound. Patient is not having significant pain. 04/19/18 on evaluation today patient appears to be doing excellent in regard to the progress she's made with her right lateral ankle ulcer. The left ankle region which did show evidence of a deep tissue injury seems to be resolving there's little fluid noted underneath and a blister there's nothing open at this point in time overall I feel like this is progressing nicely which is [name], [name]. ([id number]) good news. She does not seem to be having significant discomfort at this point which is also good news. 04/25/18-She is here in follow up evaluation for bilateral lateral malleolar ulcers. The right lateral malleolus ulcer with pale subcutaneous tissue exposure, central area of ulcer with tendon/periosteum exposed. The left lateral malleolus ulcer now with central area of nonviable tissue, otherwise deep tissue  injury. She is wearing compression wraps to the left lower extremity, she will place the right lower extremity compression wraps on when she gets home. She will be out of town over the weekend and return next week and follow-up appointment. She completed her doxycycline this morning 05/03/18 on evaluation today patient appears to be doing very well in regard to her right lateral ankle ulcer in general. At least she's showing some signs of improvement in this regard. Unfortunately she has some additional injury to the left lateral malleolus region which appears to be new likely even over the past several days. Again this determination is based on the overall appearance. With that being said the patient is obviously frustrated about this currently. 05/10/18-She is here in follow-up evaluation for bilateral lateral malleolar ulcers. She states she has purchased  offloading shoes/boots and they will arrive tomorrow. She was asked to bring them in the office at next week's appointment so her provider is aware of product being utilized. She continues to sleep on right or left side, she has been encouraged to sleep on her back. The right lateral malleolus ulcer is precariously close to peri-osteum; will order xray. The left lateral malleolus ulcer is improved. Will switch back to santyl; she will follow up next week. 05/17/18 on evaluation today patient actually appears to be doing very well in regard to her malleolus her ulcers compared to last time I saw them. She does not seem to have as much in the way of contusion at this point which is great news. With that being said she does continue to have discomfort and I do believe that she is still continuing to benefit from the offloading/pressure reducing boots that were recommended. I think this is the key to trying to get this to heal up completely. 05/24/18 on evaluation today patient actually appears to be doing worse at this point in time unfortunately compared to her last week's evaluation. She is having really no increased pain which is good news unfortunately she does have more maceration in your theme and noted surrounding the right lateral ankle the left lateral ankle is not really is erythematous I do not see signs of the overt cellulitis on that side. Unfortunately the wounds do not seem to have shown any signs of improvement since the last evaluation. She also has significant swelling especially on the right compared to previous some of this may be due to infection however also think that she may be served better while she has these wounds by compression wrapping versus continuing to use the Juxta-Lite for the time being. Especially with the amount of drainage that she is experiencing at this point. No fevers, chills, nausea, or vomiting noted at this time. 05/31/18 on evaluation today patient appears  to actually be doing better in regard to her right lateral lower extremity ulcer specifically on the malleolus region. She has been tolerating the antibiotic without complication. With that being said she still continues to have issues but a little bit of redness although nothing like she what she was experiencing previous. She still continues to pressure to her ankle area she did get the problem on offloading boots unfortunately she will not wear them she states there too uncomfortable and she can't get in and out of the bed. Nonetheless at this point her wounds seem to be continually getting worse which is not what we want I'm getting somewhat concerned about her progress and how things are going to proceed if we do not  intervene in some way shape or form. I therefore had a very lengthy conversation today about offloading yet again and even made a specific suggestion for switching her to a memory foam mattress and even gave the information for a specific one that they could look at getting if it was something that they were interested in considering. She does not want to be considered for a hospital bed air mattress although honestly insurance would not cover it that she does not have any wounds on her trunk. 06/14/18 on evaluation today both wounds over the bilateral lateral malleolus her ulcers appear to be doing better there's no evidence of pressure injury at this point. She did get the foam mattress for her bed and this does seem to have been extremely beneficial for her in my pinion. Her daughter states that she is having difficulty getting out of bed because of how soft it is. The patient also relates this to be. Nonetheless I do feel like she's actually doing better. Unfortunately right after and around the time she was getting the mattress she also sustained a fall when she got up to go pick up the phone and ended up injuring her right elbow she has 18 sutures in place. We are not caring  for this currently although home health is going to be taking the sutures out shortly. Nonetheless this may be something that we need to evaluate going forward. It depends on how well it has or has not healed in the end. She also recently saw an orthopedic specialist for an injection in the right shoulder just before her fall unfortunately the fall seems to have worsened her pain. 06/21/18 on evaluation today patient appears to be doing about the same in regard to her lateral malleolus ulcers. Both appear to be just a little bit deeper but again we are clinging away the necrotic and dead tissue which I think is why this is progressing towards a deeper realm as opposed to improving from my measurement standpoint in that regard. Nonetheless she has been tolerating the dressing changes she absolutely hates the memory foam mattress topper that was obtained for her nonetheless I do believe this is still doing excellent as far as taking care of excess pressure in regard to the lateral malleolus regions. She in fact has no pressure injury that I see whereas in weeks past it was week by week I was constantly seeing new pressure injuries. Overall I think it has been very beneficial for her. [name], [name] ([id number]) 07/03/18; patient arrives in my clinic today. She has deep punched out areas over her bilateral lateral malleoli. The area on the right has some more depth. We spent a lot of time today talking about pressure relief for these areas. This started when her daughter asked for a prescription for a memory foam mattress. I have never written a prescription for a mattress and I don't think insurances would pay for that on an ordinary bed. In any case he came up that she has foam boots that she refuses to wear. I would suggest going to these before any other offloading issues when she is in bed. They say she is meticulous about offloading this the rest of the day Electronic Signature(s) Signed:  07/03/2018 6:17:43 PM By: Linton Ham MD Entered By: Linton Ham on 07/03/2018 17:14:33 Matsuura, [name] ([id number]) -------------------------------------------------------------------------------- Physical Exam Details Patient Name: [name] Date of Service: 07/03/2018 11:15 AM Medical Record Number: [id number] Patient Account Number: [id number] Date  of Birth/Sex: 1923/10/15 (84 y.o. F) Treating RN: Cornell Barman Primary Care Provider: FITZGERALD, DAVID Other Clinician: Referring Provider: FITZGERALD, DAVID Treating Provider/Extender: Ricard Dillon Weeks in Treatment: 46 Constitutional Sitting or standing Blood Pressure is within target range for patient.. Pulse regular and within target range for patient.Marland Kitchen Respirations regular, non-labored and within target range.. Temperature is normal and within the target range for the patient.Marland Kitchen appears in no distress. Respiratory Respiratory effort is easy and symmetric bilaterally. Rate is normal at rest and on room air.. Cardiovascular Pedal pulses palpable and strong bilaterally.. chronic venous insufficiency but not much in the way of edema.Marland Kitchen Lymphatic none palpable in the popliteal area bilaterally. Integumentary (Hair, Skin) very fragile skin in the lower extremities. Notes wound exam; no debridement was necessary. There is no surrounding infection. She has chronic venous insufficiency but not much in the way of edema. She has been using her own juxta lite stockings. Electronic Signature(s) Signed: 07/03/2018 6:17:43 PM By: Linton Ham MD Entered By: Linton Ham on 07/03/2018 17:16:36 [name], [name] -------------------------------------------------------------------------------- Physician Orders Details Patient Name: [name] Date of Service: 07/03/2018 11:15 AM Medical Record Number: [id number] Patient Account Number: [id number] Date of Birth/Sex: [birth year] (84 y.o. F) Treating  RN: Cornell Barman Primary Care Provider: FITZGERALD, DAVID Other Clinician: Referring Provider: FITZGERALD, DAVID Treating Provider/Extender: Tito Dine in Treatment: 19 Verbal / Phone Orders: Yes Clinician: Cornell Barman Read Back and Verified: No Diagnosis Coding Wound Cleansing Wound #1 Right,Lateral Malleolus o Clean wound with Normal Saline. o Cleanse wound with mild soap and water o May Shower, gently pat wound dry prior to applying new dressing. Wound #2 Left,Lateral Malleolus o Clean wound with Normal Saline. o Cleanse wound with mild soap and water o May Shower, gently pat wound dry prior to applying new dressing. Anesthetic (add to Medication List) Wound #1 Right,Lateral Malleolus o Topical Lidocaine 4% cream applied to wound bed prior to debridement (In Clinic Only). Wound #2 Left,Lateral Malleolus o Topical Lidocaine 4% cream applied to wound bed prior to debridement (In Clinic Only). o Topical Lidocaine 4% cream applied to wound bed prior to debridement (In Clinic Only). Primary Wound Dressing Wound #1 Right,Lateral Malleolus o Silver Collagen Wound #2 Left,Lateral Malleolus o Silver Collagen Secondary Dressing Wound #1 Right,Lateral Malleolus o Boardered Foam Dressing Wound #2 Left,Lateral Malleolus o Boardered Foam Dressing Dressing Change Frequency Wound #1 Right,Lateral Malleolus o Change Dressing Monday, Wednesday, Friday Wound #2 Left,Lateral Malleolus o Change Dressing Monday, Wednesday, Friday Follow-up Appointments Wound #1 Right,Lateral Malleolus o Return Appointment in 2 weeks. [name], [name] ([id number]) Wound #2 Left,Lateral Malleolus o Return Appointment in 2 weeks. Edema Control Wound #1 Right,Lateral Malleolus o Patient to wear own Velcro compression garment. o Elevate legs to the level of the heart and pump ankles as often as possible Wound #2 Left,Lateral Malleolus o Patient to wear own  Velcro compression garment. o Elevate legs to the level of the heart and pump ankles as often as possible Off-Loading Wound #1 Right,Lateral Malleolus o Mattress o Turn and reposition every 2 hours o Other: - do not put pressure on your right ankle Wound #2 Left,Lateral Malleolus o Mattress o Turn and reposition every 2 hours o Other: - do not put pressure on your ankles Additional Orders / Instructions Wound #1 Right,Lateral Malleolus o Increase protein intake. o Other: - Add Vitamin C, Zinc, Vitamin A to your diet Wound #2 Left,Lateral Malleolus o Increase protein intake. o Other: - Add Vitamin C, Zinc,  Vitamin A to your diet Home Health Wound #1 [place] Visits - [facility]Evaluate for [place] Nurse may visit PRN to address patientos wound care needs. o FACE TO FACE ENCOUNTER: MEDICARE and MEDICAID PATIENTS: I certify that this patient is under my care and that I had a face-to-face encounter that meets the physician face-to-face encounter requirements with this patient on this date. The encounter with the patient was in whole or in part for the following MEDICAL CONDITION: (primary reason for [name]) MEDICAL NECESSITY: I certify, that based on my findings, NURSING services are a medically necessary home health service. HOME BOUND STATUS: I certify that my clinical findings support that this patient is homebound (i.e., Due to illness or injury, pt requires aid of supportive devices such as crutches, cane, wheelchairs, walkers, the use of special transportation or the assistance of another person to leave their place of residence. There is a normal inability to leave the home and doing so requires considerable and taxing effort. Other absences are for medical reasons / religious services and are infrequent or of short duration when for other reasons). o If current dressing causes regression in  wound condition, may D/C ordered dressing product/s and apply Normal Saline Moist Dressing daily until next [place] / Other MD appointment. [place] of regression in wound condition at [phone]. o Please direct any NON-WOUND related issues/requests for orders to patient's Primary Care Physician Wound #2 [place] Visits - [place] Nurse may visit PRN to address patientos wound care needs. KARLISSA, ARON ([id number]) o FACE TO FACE ENCOUNTER: MEDICARE and MEDICAID PATIENTS: I certify that this patient is under my care and that I had a face-to-face encounter that meets the physician face-to-face encounter requirements with this patient on this date. The encounter with the patient was in whole or in part for the following MEDICAL CONDITION: (primary reason for [place]) MEDICAL NECESSITY: I certify, that based on my findings, NURSING services are a medically necessary home health service. HOME BOUND STATUS: I certify that my clinical findings support that this patient is homebound (i.e., Due to illness or injury, pt requires aid of supportive devices such as crutches, cane, wheelchairs, walkers, the use of special transportation or the assistance of another person to leave their place of residence. There is a normal inability to leave the home and doing so requires considerable and taxing effort. Other absences are for medical reasons / religious services and are infrequent or of short duration when for other reasons). o If current dressing causes regression in wound condition, may D/C ordered dressing product/s and apply Normal Saline Moist Dressing daily until next [place] / Other MD appointment. [place] of regression in wound condition at [phone]. o Please direct any NON-WOUND related issues/requests for orders to patient's Primary Care  Physician Electronic Signature(s) Signed: 07/03/2018 6:17:43 PM By: Linton Ham MD Signed: 07/05/2018 6:17:48 PM By: Gretta Cool, BSN, RN, CWS, Kim RN, BSN Entered By: Gretta Cool, BSN, RN, CWS, Kim on 07/03/2018 12:19:21 [name] ([id number]) -------------------------------------------------------------------------------- Problem List Details Patient Name: [name], [name] Date of Service: 07/03/2018 11:15 AM Medical Record Number: [id number] Patient Account Number: [id number] Date of Birth/Sex: 14-Sep-1923 (84 y.o. F) Treating RN: Cornell Barman Primary Care Provider: FITZGERALD, DAVID Other Clinician: Referring Provider: FITZGERALD, DAVID Treating Provider/Extender: Tito Dine in Treatment: 38 Active Problems ICD-10 Evaluated Encounter Code Description Active Date  Today Diagnosis 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, stage 4 05/10/2018 No Yes I70.233 Atherosclerosis of native arteries of right leg with ulceration of 08/10/2017 No Yes ankle I89.0 Lymphedema, not elsewhere classified 08/10/2017 No Yes B35.4 Tinea corporis 09/28/2017 No Yes L89.523 Pressure ulcer of left ankle, stage 3 04/25/2018 No Yes Inactive Problems Resolved Problems Electronic Signature(s) Signed: 07/03/2018 6:17:43 PM By: Linton Ham MD Entered By: Linton Ham on 07/03/2018 17:11:50 [name], [name] ([id number]) -------------------------------------------------------------------------------- Progress Note Details Patient Name: [name] Date of Service: 07/03/2018 11:15 AM Medical Record Number: [id number] Patient Account Number: [id number] Date of Birth/Sex: 18-Jan-1923 (84 y.o. F) Treating RN: Cornell Barman Primary Care Provider: FITZGERALD, DAVID Other Clinician: Referring Provider: FITZGERALD, DAVID Treating Provider/Extender: Ricard Dillon Weeks in Treatment: 46 Subjective History of Present Illness (HPI) 84 year old patient who most recently has been seeing both podiatry  and vascular surgery for a long-standing ulcer of her right lateral malleolus which has been treated with various methodologies. Dr. Amalia Hailey the podiatrist saw her on 07/20/2017 and sent her to the wound center for possible hyperbaric oxygen therapy. past medical history of peripheral vascular disease, varicose veins, status post appendectomy, basal cell carcinoma excision from the left leg, cholecystectomy, pacemaker placement, right lower extremity angiography done by Dr. dew in March 2017 with placement of a stent. there is also note of a successful ablation of the right small saphenous vein done which was reviewed by ultrasound on 10/24/2016. the patient had a right small saphenous vein ablation done on 10/20/2016. The patient has never been a smoker. She has been seen by Dr. Corene Cornea dew the vascular surgeon who most recently saw her on 06/15/2017 for evaluation of ongoing problems with right leg swelling. She had a lower extremity arterial duplex examination done(02/13/17) which showed patent distal right superficial femoral artery stent and above-the-knee popliteal stent without evidence of restenosis. The ABI was more than 1.3 on the right and more than 1.3 on the left. This was consistent with noncompressible arteries due to medial calcification. The right great toe pressure and PPG waveforms are within normal limits and the left great toe pressure and PPG waveforms are decreased. he recommended she continue to wear her compression stockings and continue with elevation. She is scheduled to have a noninvasive arterial study in the near future 08/16/2017 -- had a lower extremity arterial duplex examination done which showed patent distal right superficial femoral artery stent and above-the-knee popliteal stent without evidence of restenosis. The ABI was more than 1.3 on the right and more than 1.3 on the left. This was consistent with noncompressible arteries due to medial calcification. The right  great toe pressure and PPG waveforms are within normal limits and the left great toe pressure and PPG waveforms are decreased. the x-ray of the right ankle has not yet been done 08/24/2017 -- had a right ankle x-ray -- IMPRESSION:1. No fracture, bone lesion or evidence of osteomyelitis. 2. Lateral soft tissue swelling with a soft tissue ulcer. she has not yet seen the vascular surgeon for review 08/31/17 on evaluation today patient's wound appears to be showing signs of improvement. She still with her appointment with vascular in order to review her results of her vascular study and then determine if any intervention would be recommended at that time. No fevers, chills, nausea, or vomiting noted at this time. She has been tolerating the dressing changes without complication. 09/28/17 on evaluation today patient's wound appears to show signs of good improvement in regard to the granulation  tissue which is surfacing. There is still a layer of slough covering the wound and the posterior portion is still significantly deeper than the anterior nonetheless there has been some good sign of things moving towards the better. She is going to go back to Dr. dew for reevaluation to ensure her blood flow is still appropriate. That will be before her next evaluation with [place] next week. No fevers, chills, nausea, or vomiting noted at this time. Patient does have some discomfort rated to be a 3-4/10 depending on activity specifically cleansing the wound makes it worse. 10/05/2017 -- the patient was seen by Dr. Lucky Cowboy last week and noninvasive studies showed a normal right ABI with brisk triphasic waveforms consistent with no arterial insufficiency including normal digital pressures. The duplex showed a patent distal right SFA stent and the proximal SFA was also normal. He was pleased with her test and thought she should have enough of perfusion for normal wound healing. He would see her back in 6 months time. 12/21/17  on evaluation today patient appears to be doing fairly well in regard to her right lateral ankle wound. Unfortunately the [name], [name] ([id number]) main issue that she is expansion at this point is that she is having some issues with what appears to be some cellulitis in the right anterior shin. She has also been noting a little bit of uncomfortable feeling especially last night and her ankle area. I'm afraid that she made the developing a little bit of an infection. With that being said I think it is in the early stages. 12/28/17 on evaluation today patient's ankle appears to be doing excellent. She's making good progress at this point the cellulitis seems to have improved after last week's evaluation. Overall she is having no significant discomfort which is excellent news. She does have an appointment with Dr. dew on March 29, 2018 for reevaluation in regard to the stent he placed. She seems to have excellent blood flow in the right lower extremity. 01/19/12 on evaluation today patient's wound appears to be doing very well. In fact she does not appear to require debridement at this point, there's no evidence of infection, and overall from the standpoint of the wound she seems to be doing very well. With that being said I believe that it may be time to switch to different dressing away from the [facility] Dressing she tells me she does have a lot going on her friend actually passed away yesterday and she's also having a lot of issues with her husband this obviously is weighing heavy on her as far as your thoughts and concerns today. 01/25/18 on evaluation today patient appears to be doing fairly well in regard to her right lateral malleolus. She has been tolerating the dressing changes without complication. Overall I feel like this is definitely showing signs of improvement as far as how the overall appearance of the wound is there's also evidence of epithelium start to migrate over the  granulation tissue. In general I think that she is progressing nicely as far as the wound is concerned. The only concern she really has is whether or not we can switch to every other week visits in order to avoid having as many appointments as her daughters have a difficult time getting her to her appointments as well as the patient's husband to his he is not doing very well at this point. 02/22/18 on evaluation today patient's right lateral malleolus ulcer appears to be doing great. She has been  tolerating the dressing changes without complication. Overall you making excellent progress at this time. Patient is having no significant discomfort. 03/15/18 on evaluation today patient appears to be doing much more poorly in regard to her right lateral ankle ulcer at this point. Unfortunately since have last seen her her husband has passed just a few days ago is obviously weighed heavily on her her daughter also had surgery well she is with her today as usual. There does not appear to be any evidence of infection she does seem to have significant contusion/deep tissue injury to the right lateral malleolus which was not noted previous when I saw her last. It's hard to tell of exactly when this injury occurred although during the time she was spending the night in the hospital this may have been most likely. 03/22/18 on evaluation today patient appears to actually be doing very well in regard to her ulcer. She did unfortunately have a setback which was noted last week however the good news is we seem to be getting back on track and in fact the wound in the core did still have some necrotic tissue which will be addressed at this point today but in general I'm seeing signs that things are on the up and up. She is glad to hear this obviously she's been somewhat concerned that due to the how her wound digressed more recently. 03/29/18 on evaluation today patient appears to be doing fairly well in regard to her  right lower extremity lateral malleolus ulcer. She unfortunately does have a new area of pressure injury over the inferior portion where the wound has opened up a little bit larger secondary to the pressure she seems to be getting. She does tell me sometimes when she sleeps at night that it actually hurts and does seem to be pushing on the area little bit more unfortunately. There does not appear to be any evidence of infection which is good news. She has been tolerating the dressing changes without complication. She also did have some bruising in the left second and third toes due to the fact that she may have bump this or injured it although she has neuropathy so she does not feel she did move recently that may have been where this came from. Nonetheless there does not appear to be any evidence of infection at this time. 04/12/18 on evaluation today patient's wound on the right lateral ankle actually appears to be doing a little bit better with a lot of necrotic docking tissue centrally loosening up in clearing away. However she does have the beginnings of a deep tissue injury on the left lateral malleolus likely due to the fact we've been trying offload the right as much as we have. I think she may benefit from an assistive soft device to help with offloading and it looks like they're looking at one of the doughnut conditions that wraps around the lower leg to offload which I think will definitely do a good job. With that being said I think we definitely need to address this issue on the left before it becomes a wound. Patient is not having significant pain. 04/19/18 on evaluation today patient appears to be doing excellent in regard to the progress she's made with her right lateral ankle ulcer. The left ankle region which did show evidence of a deep tissue injury seems to be resolving there's little fluid noted underneath and a blister there's nothing open at this point in time overall I feel like  this  is progressing nicely which is good news. She does not seem to be having significant discomfort at this point which is also good news. 04/25/18-She is here in follow up evaluation for bilateral lateral malleolar ulcers. The right lateral malleolus ulcer with pale Gerdeman, [name] ([id number]) subcutaneous tissue exposure, central area of ulcer with tendon/periosteum exposed. The left lateral malleolus ulcer now with central area of nonviable tissue, otherwise deep tissue injury. She is wearing compression wraps to the left lower extremity, she will place the right lower extremity compression wraps on when she gets home. She will be out of town over the weekend and return next week and follow-up appointment. She completed her doxycycline this morning 05/03/18 on evaluation today patient appears to be doing very well in regard to her right lateral ankle ulcer in general. At least she's showing some signs of improvement in this regard. Unfortunately she has some additional injury to the left lateral malleolus region which appears to be new likely even over the past several days. Again this determination is based on the overall appearance. With that being said the patient is obviously frustrated about this currently. 05/10/18-She is here in follow-up evaluation for bilateral lateral malleolar ulcers. She states she has purchased offloading shoes/boots and they will arrive tomorrow. She was asked to bring them in the office at next week's appointment so her provider is aware of product being utilized. She continues to sleep on right or left side, she has been encouraged to sleep on her back. The right lateral malleolus ulcer is precariously close to peri-osteum; will order xray. The left lateral malleolus ulcer is improved. Will switch back to santyl; she will follow up next week. 05/17/18 on evaluation today patient actually appears to be doing very well in regard to her malleolus her ulcers compared  to last time I saw them. She does not seem to have as much in the way of contusion at this point which is great news. With that being said she does continue to have discomfort and I do believe that she is still continuing to benefit from the offloading/pressure reducing boots that were recommended. I think this is the key to trying to get this to heal up completely. 05/24/18 on evaluation today patient actually appears to be doing worse at this point in time unfortunately compared to her last week's evaluation. She is having really no increased pain which is good news unfortunately she does have more maceration in your theme and noted surrounding the right lateral ankle the left lateral ankle is not really is erythematous I do not see signs of the overt cellulitis on that side. Unfortunately the wounds do not seem to have shown any signs of improvement since the last evaluation. She also has significant swelling especially on the right compared to previous some of this may be due to infection however also think that she may be served better while she has these wounds by compression wrapping versus continuing to use the Juxta-Lite for the time being. Especially with the amount of drainage that she is experiencing at this point. No fevers, chills, nausea, or vomiting noted at this time. 05/31/18 on evaluation today patient appears to actually be doing better in regard to her right lateral lower extremity ulcer specifically on the malleolus region. She has been tolerating the antibiotic without complication. With that being said she still continues to have issues but a little bit of redness although nothing like she what she was experiencing previous. She still continues  to pressure to her ankle area she did get the problem on offloading boots unfortunately she will not wear them she states there too uncomfortable and she can't get in and out of the bed. Nonetheless at this point her wounds seem to  be continually getting worse which is not what we want I'm getting somewhat concerned about her progress and how things are going to proceed if we do not intervene in some way shape or form. I therefore had a very lengthy conversation today about offloading yet again and even made a specific suggestion for switching her to a memory foam mattress and even gave the information for a specific one that they could look at getting if it was something that they were interested in considering. She does not want to be considered for a hospital bed air mattress although honestly insurance would not cover it that she does not have any wounds on her trunk. 06/14/18 on evaluation today both wounds over the bilateral lateral malleolus her ulcers appear to be doing better there's no evidence of pressure injury at this point. She did get the foam mattress for her bed and this does seem to have been extremely beneficial for her in my pinion. Her daughter states that she is having difficulty getting out of bed because of how soft it is. The patient also relates this to be. Nonetheless I do feel like she's actually doing better. Unfortunately right after and around the time she was getting the mattress she also sustained a fall when she got up to go pick up the phone and ended up injuring her right elbow she has 18 sutures in place. We are not caring for this currently although home health is going to be taking the sutures out shortly. Nonetheless this may be something that we need to evaluate going forward. It depends on how well it has or has not healed in the end. She also recently saw an orthopedic specialist for an injection in the right shoulder just before her fall unfortunately the fall seems to have worsened her pain. 06/21/18 on evaluation today patient appears to be doing about the same in regard to her lateral malleolus ulcers. Both appear to be just a little bit deeper but again we are clinging away the  necrotic and dead tissue which I think is why this is progressing towards a deeper realm as opposed to improving from my measurement standpoint in that regard. Nonetheless she has been tolerating the dressing changes she absolutely hates the memory foam mattress topper that was obtained for her nonetheless I do believe this is still doing excellent as far as taking care of excess pressure in regard to the lateral malleolus regions. She in fact has no pressure injury that I see whereas in weeks past it was week by week I was constantly seeing new pressure injuries. Overall I think it has been very beneficial for her. 07/03/18; patient arrives in my clinic today. She has deep punched out areas over her bilateral lateral malleoli. The area on the right has some more depth. [name], [name] ([id number]) We spent a lot of time today talking about pressure relief for these areas. This started when her daughter asked for a prescription for a memory foam mattress. I have never written a prescription for a mattress and I don't think insurances would pay for that on an ordinary bed. In any case he came up that she has foam boots that she refuses to wear. I would suggest  going to these before any other offloading issues when she is in bed. They say she is meticulous about offloading this the rest of the day Objective Constitutional Sitting or standing Blood Pressure is within target range for patient.. Pulse regular and within target range for patient.Marland Kitchen Respirations regular, non-labored and within target range.. Temperature is normal and within the target range for the patient.Marland Kitchen appears in no distress. Vitals Time Taken: 11:43 AM, Height: 65 in, Weight: 154.3 lbs, BMI: 25.7, Temperature: 98.2 F, Pulse: 89 bpm, Respiratory Rate: 18 breaths/min, Blood Pressure: 138/67 mmHg. Respiratory Respiratory effort is easy and symmetric bilaterally. Rate is normal at rest and on room air.. Cardiovascular Pedal  pulses palpable and strong bilaterally.. chronic venous insufficiency but not much in the way of edema.Marland Kitchen Lymphatic none palpable in the popliteal area bilaterally. General Notes: wound exam; no debridement was necessary. There is no surrounding infection. She has chronic venous insufficiency but not much in the way of edema. She has been using her own juxta lite stockings. Integumentary (Hair, Skin) very fragile skin in the lower extremities. Wound #1 status is Open. Original cause of wound was Gradually Appeared. The wound is located on the Right,Lateral Malleolus. The wound measures 1.4cm length x 1.2cm width x 0.4cm depth; 1.319cm^2 area and 0.528cm^3 volume. There is Fat Layer (Subcutaneous Tissue) Exposed exposed. There is no tunneling or undermining noted. There is a large amount of serosanguineous drainage noted. The wound margin is distinct with the outline attached to the wound base. There is no granulation within the wound bed. There is a large (67-100%) amount of necrotic tissue within the wound bed including Eschar and Adherent Slough. The periwound skin appearance exhibited: Maceration, Rubor, Erythema. The periwound skin appearance did not exhibit: Callus, Crepitus, Excoriation, Induration, Rash, Scarring, Dry/Scaly, Atrophie Blanche, Cyanosis, Ecchymosis, Hemosiderin Staining, Mottled, Pallor. The surrounding wound skin color is noted with erythema which is circumferential. Periwound temperature was noted as No Abnormality. The periwound has tenderness on palpation. Wound #2 status is Open. Original cause of wound was Gradually Appeared. The wound is located on the Left,Lateral Malleolus. The wound measures 1.7cm length x 1.7cm width x 0.6cm depth; 2.27cm^2 area and 1.362cm^3 volume. There is Fat Layer (Subcutaneous Tissue) Exposed exposed. There is no tunneling or undermining noted. There is a large amount of purulent drainage noted. The wound margin is flat and intact. There is no  granulation within the wound bed. There is a large (67-100%) amount of necrotic tissue within the wound bed including Eschar and Adherent Slough. The periwound skin appearance exhibited: Maceration, Erythema. The periwound skin appearance did not exhibit: Callus, Crepitus, Excoriation, Induration, Rash, Scarring, Dry/Scaly, Atrophie Blanche, Cyanosis, Ecchymosis, Hemosiderin Staining, Mottled, Pallor, Matar, [name] ([id number]) Rubor. The surrounding wound skin color is noted with erythema which is circumferential. Periwound temperature was noted as No Abnormality. 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 Cleanse wound with mild soap and water May Shower, gently pat wound dry prior to applying new dressing. Wound #2 Left,Lateral Malleolus: Clean wound with Normal Saline. Cleanse wound with mild soap and water May Shower, gently pat wound dry prior to applying new dressing. Anesthetic (add to Medication List): Wound #1 Right,Lateral Malleolus: Topical Lidocaine 4% cream applied to wound bed prior to debridement (In Clinic  Only). Wound #2 Left,Lateral Malleolus: Topical Lidocaine 4% cream applied to wound bed prior to debridement (In Clinic Only). Topical Lidocaine 4% cream applied to wound bed prior to debridement (In Clinic Only). Primary Wound Dressing: Wound #1 Right,Lateral Malleolus: Silver Collagen Wound #2 Left,Lateral Malleolus: Silver Collagen Secondary Dressing: Wound #1 Right,Lateral Malleolus: Boardered Foam Dressing Wound #2 Left,Lateral Malleolus: Boardered Foam Dressing Dressing Change Frequency: Wound #1 Right,Lateral Malleolus: Change Dressing Monday, Wednesday, Friday Wound  #2 Left,Lateral Malleolus: Change Dressing Monday, Wednesday, Friday Follow-up Appointments: Wound #1 Right,Lateral Malleolus: Return Appointment in 2 weeks. Wound #2 Left,Lateral Malleolus: [name], [name] ([id number]) Return Appointment in 2 weeks. Edema Control: Wound #1 Right,Lateral Malleolus: Patient to wear own Velcro compression garment. Elevate legs to the level of the heart and pump ankles as often as possible Wound #2 Left,Lateral Malleolus: Patient to wear own Velcro compression garment. Elevate legs to the level of the heart and pump ankles as often as possible Off-Loading: Wound #1 Right,Lateral Malleolus: Mattress Turn and reposition every 2 hours Other: - do not put pressure on your right ankle Wound #2 Left,Lateral Malleolus: Mattress Turn and reposition every 2 hours Other: - do not put pressure on your ankles Additional Orders / Instructions: Wound #1 Right,Lateral Malleolus: Increase protein intake. Other: - Add Vitamin C, Zinc, Vitamin A to your diet Wound #2 Left,Lateral Malleolus: Increase protein intake. Other: - Add Vitamin C, Zinc, Vitamin A to your diet Home Health: Wound #1 Right,Lateral Malleolus: [place] Visits - [facility] for [place] Nurse may visit PRN to address patient s wound care needs. FACE TO FACE ENCOUNTER: MEDICARE and MEDICAID PATIENTS: I certify that this patient is under my care and that I had a face-to-face encounter that meets the physician face-to-face encounter requirements with this patient on this date. The encounter with the patient was in whole or in part for the following MEDICAL CONDITION: (primary reason for [place]) MEDICAL NECESSITY: I certify, that based on my findings, NURSING services are a medically necessary home health service. HOME BOUND STATUS: I certify that my clinical findings support that this patient is homebound (i.e., Due to illness or injury, pt requires aid of  supportive devices such as crutches, cane, wheelchairs, walkers, the use of special transportation or the assistance of another person to leave their place of residence. There is a normal inability to leave the home and doing so requires considerable and taxing effort. Other absences are for medical reasons / religious services and are infrequent or of short duration when for other reasons). If current dressing causes regression in wound condition, may D/C ordered dressing product/s and apply Normal Saline Moist Dressing daily until next [place] / Other MD appointment. [place] of regression in wound condition at [phone]. Please direct any NON-WOUND related issues/requests for orders to patient's Primary Care Physician Wound #2 Left,Lateral Malleolus: [place] Visits - [place] Nurse may visit PRN to address patient s wound care needs. FACE TO FACE ENCOUNTER: MEDICARE and MEDICAID PATIENTS: I certify that this patient is under my care and that I had a face-to-face encounter that meets the physician face-to-face encounter requirements with this patient on this date. The encounter with the patient was in whole or in part for the following MEDICAL CONDITION: (primary reason for [place]) MEDICAL NECESSITY: I certify, that based on my findings, NURSING services are a medically necessary home health service. HOME BOUND STATUS: I certify that my clinical findings support that  this patient is homebound (i.e., Due to illness or injury, pt requires aid of supportive devices such as crutches, cane, wheelchairs, walkers, the use of special transportation or the assistance of another person to leave their place of residence. There is a normal inability to leave the home and doing so requires considerable and taxing effort. Other absences are for medical reasons / religious services and are infrequent or of short duration when for other  reasons). If current dressing causes regression in wound condition, may D/C ordered dressing product/s and apply Normal Saline Moist Dressing daily until next [place] / Other MD appointment. [place] of regression in wound condition at [phone]. Please direct any NON-WOUND related issues/requests for orders to patient's Primary Care Physician Marialuisa, Basara [name] ([id number]) #1 continue silver collagen is the main dressing with border foam and the patient's own stockings #2 the patient is very resistant to a lot of forms of offloading especially at night. We discussed this I would like her to use the foam boots that she has already. #3 consider skin substitute. Both wound surfaces look reasonable. #4 by consider putting her legs in compression with a primary dressing and padding over the both surfaces of these difficult wounds. #5 I don't think there is an arterial issue Electronic Signature(s) Signed: 07/03/2018 6:17:43 PM By: Linton Ham MD Entered By: Linton Ham on 07/03/2018 17:19:18 [place], [name] ([id number]) -------------------------------------------------------------------------------- SuperBill Details Patient Name: [name] Date of Service: 07/03/2018 Medical Record Number: [id number] Patient Account Number: [id number] Date of Birth/Sex: 23-Jan-1923 (84 y.o. 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 I89.0 Lymphedema, not elsewhere classified B35.4 Tinea corporis L89.523 Pressure ulcer of left ankle, stage 3 Facility Procedures CPT4 Code: 86381771 Description: [phone] - WOUND CARE VISIT-LEV 2 EST  PT Modifier: Quantity: 1 Physician Procedures CPT4 Code: [id number] Description: 32919 - WC PHYS LEVEL 3 - EST PT ICD-10 Diagnosis Description 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, stage 4 L89.523 Pressure ulcer of left ankle, stage 3 Modifier: Quantity: 1 Electronic Signature(s) Signed: 07/08/2018 10:48:32 AM By: Gretta Cool, BSN, RN, CWS, Kim RN, BSN Previous Signature: 07/03/2018 6:17:43 PM Version By: Linton Ham MD Entered By: Gretta Cool, BSN, RN, CWS, Kim on 07/08/2018 10:48:31

## 2018-07-17 ENCOUNTER — Emergency Department
Admission: EM | Admit: 2018-07-17 | Discharge: 2018-07-17 | Disposition: A | Discharge status: Home / Self Care | Payer: Medicare Other | Attending: Student in an Organized Health Care Education/Training Program | Admitting: Student in an Organized Health Care Education/Training Program

## 2018-07-17 ENCOUNTER — Encounter: Payer: Self-pay | Admitting: Emergency Medicine

## 2018-07-17 ENCOUNTER — Other Ambulatory Visit: Payer: Self-pay

## 2018-07-17 ENCOUNTER — Emergency Department: Payer: Medicare Other

## 2018-07-17 DIAGNOSIS — Z950 Presence of cardiac pacemaker: Secondary | ICD-10-CM | POA: Diagnosis not present

## 2018-07-17 DIAGNOSIS — Z79899 Other long term (current) drug therapy: Secondary | ICD-10-CM | POA: Diagnosis not present

## 2018-07-17 DIAGNOSIS — M79672 Pain in left foot: Secondary | ICD-10-CM | POA: Diagnosis present

## 2018-07-17 DIAGNOSIS — Z7982 Long term (current) use of aspirin: Secondary | ICD-10-CM | POA: Insufficient documentation

## 2018-07-17 DIAGNOSIS — M722 Plantar fascial fibromatosis: Secondary | ICD-10-CM | POA: Diagnosis not present

## 2018-07-17 DIAGNOSIS — Z8673 Personal history of transient ischemic attack (TIA), and cerebral infarction without residual deficits: Secondary | ICD-10-CM | POA: Insufficient documentation

## 2018-07-17 MED ORDER — PREDNISONE 50 MG PO TABS: 50.0000 mg | ORAL_TABLET | Freq: Every day | ORAL | 0 refills | Status: DC

## 2018-07-17 MED ORDER — PREDNISONE 20 MG PO TABS
60.0000 mg | ORAL_TABLET | Freq: Once | ORAL | Status: AC
  Administered 2018-07-17: 60 mg via ORAL
  Filled 2018-07-17: qty 3

## 2018-07-17 MED ORDER — WHEELCHAIR MISC: 1.0000 [IU] | Freq: Once | 0 refills | Status: DC

## 2018-07-17 MED ORDER — WHEELCHAIR MISC: 1.0000 [IU] | Freq: Once | 0 refills | Status: AC

## 2018-07-17 NOTE — ED Provider Notes (Signed)
-----------------------------------------   9:11 PM on 07/17/2018 -----------------------------------------   Blood pressure 128/60, pulse 77, temperature 97.9 F (36.6 C), temperature source Oral, resp. rate 18, height 5\' 6"  (1.676 m), weight 68 kg, SpO2 95 %.  Assuming care from [facility], [place].  In short, [name] is a 84 y.o. female with a chief complaint of Foot Pain .  Refer to the original H&P for additional details.  The current plan of care is to await the results of x-ray.  Patient presented with left foot pain.  Patient originally stated that she had struck her foot/jammed her great toe.  After discussion with her family, it appears that patient has also been performing exercises assigned to her by physical therapy to do multiple exercises with her foot. The patient is to push up to the balls of her foot while in a standing position.  Given patient's symptoms, reassuring x-ray, reassuring exam, symptoms and physical exam are consistent with plantar fasciitis.  Patient will be placed on short course of prednisone for same.  Patient has an appointment with podiatry for an unrelated issue in a week.  She is to follow-up with podiatry at that time.  Diagnosis: Plantar fasciitis       Darletta Moll, PA-C 07/18/18 0012    Merlyn Lot, MD 07/18/18 [phone]

## 2018-07-17 NOTE — ED Triage Notes (Signed)
Pt presents to ED via EMS from [facility] facility with c/o left foot pain. Pt states she has a ulcer to her left ankle that she has been treated by the wound center and home health for the past year for the same. Pt states she has worsening pain to her foot earlier this afternoon. Pt states she is unable to ambulate due to her pain.

## 2018-07-17 NOTE — ED Notes (Signed)
Pt states that she is unable to bear weight on left foot due to pain

## 2018-07-17 NOTE — ED Provider Notes (Signed)
[name] Emergency Department Provider Note  ____________________________________________  Time seen: Approximately 8:25 PM  I have reviewed the triage vital signs and the nursing notes.   HISTORY  Chief Complaint Foot Pain    HPI [name] is a 84 y.o. female presents to the emergency department with 8 out of 10 left foot pain that only occurs with ambulation.  Patient has a history of diabetic foot ulcers along lateral aspect of left ankle that has been managed by wound care.  Patient is not currently taking any form of antibiotics.  Patient reports that she stubbed her left great toe several days ago and today when she awoke, she had difficulty bearing weight.  Patient describes her pain as occurring along the dorsal aspect of the left foot in the distribution of the first through fifth metatarsals.  No weakness or radiculopathy.  Patient's children are concerned as patient's mobility has been affected.   Past Medical History:  Diagnosis Date  . Peripheral vascular disease ([place])   . Varicose veins     Patient Active Problem List   Diagnosis Date Noted  . Lymphedema 02/13/2017  . PVD (peripheral vascular disease) ([place]) 09/12/2016  . Lower limb ulcer, ankle, right, limited to breakdown of skin ([place]) 09/12/2016  . Varicose veins of lower extremities with ulcer ([place]) 09/12/2016  . Difficult or painful urination 05/10/2016  . Abnormal chest x-ray 04/07/2016  . Breath shortness 04/07/2016  . Allergic state 02/25/2016  . Back pain, chronic 02/25/2016  . HLD (hyperlipidemia) 02/25/2016  . 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 Sick sinus syndrome ([place]) 02/25/2016  . Cerebrovascular accident (CVA) ([place]) 02/25/2016  . 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 Acquired hypothyroidism 05/04/2015  . H/O respiratory system disease 05/04/2015  . Paroxysmal atrial fibrillation ([place]) 12/30/2014  . Finger injury 11/29/2014    Past Surgical  History:  Procedure Laterality Date  . APPENDECTOMY    . BASAL CELL CARCINOMA EXCISION Left   . BREAST CYST EXCISION Left   . CATARACT EXTRACTION Left   . CHOLECYSTECTOMY    . EYE SURGERY    . PACEMAKER PLACEMENT    . PERIPHERAL VASCULAR CATHETERIZATION Right 10/09/2016   Procedure: Lower Extremity Angiography;  Surgeon: Algernon Huxley, MD;  Location: [place] [facility];  Service: Cardiovascular;  Laterality: Right;  . PERIPHERAL VASCULAR CATHETERIZATION  10/09/2016   Procedure: Lower Extremity Intervention;  Surgeon: Algernon Huxley, MD;  Location: [place] [facility];  Service: Cardiovascular;;  . TONSILLECTOMY      Prior to Admission medications   Medication Sig Start Date End Date Taking? Authorizing Provider  acetaminophen (TYLENOL) 650 MG CR tablet Take 650 mg by mouth every 8 (eight) hours as needed for pain.    [provider]  aspirin EC 81 MG tablet Take 81 mg by mouth daily.    [provider]  Biotin 1000 MCG tablet Take 1,000 mcg by mouth daily.    [provider]  calcium carbonate (OSCAL) 1500 (600 Ca) MG TABS tablet Take 600 mg of elemental calcium by mouth daily.    [provider]  CARTIA XT 300 MG 24 hr capsule Take 300 mg by mouth daily.  09/09/16   [provider]  Cholecalciferol (VITAMIN D) 2000 units CAPS Take 2,000 Units by mouth daily.    [provider]  dabigatran (PRADAXA) 150 MG CAPS capsule Take 150 mg by mouth 2 (two) times daily.    [provider]  fluticasone Asencion Islam)  50 MCG/ACT nasal spray Place into the nose. 07/24/16   [provider]  furosemide (LASIX) 40 MG tablet Take 40 mg by mouth daily.    [provider]  Glucosamine 500 MG CAPS Take 500 mg by mouth daily.    [provider]  levothyroxine (SYNTHROID, LEVOTHROID) 50 MCG tablet Take 50 mcg by mouth daily before breakfast.  09/11/16   [provider]  losartan (COZAAR) 50 MG tablet Take 50 mg by mouth  daily.    [provider]  magnesium oxide (MAG-OX) 400 MG tablet Take 400 mg by mouth daily.    [provider]  metoprolol (LOPRESSOR) 50 MG tablet Take 50 mg by mouth 2 (two) times daily. 09/22/16   [provider]  Multiple Vitamin (MULTIVITAMIN WITH MINERALS) TABS tablet Take 1 tablet by mouth daily.    [provider]  Multiple Vitamins-Minerals (PRESERVISION/LUTEIN PO) Take 1 capsule by mouth daily.    [provider]  mupirocin ointment (BACTROBAN) 2 % Apply to wound after soaking BID 05/23/17   Hyatt, Max T, DPM  NONFORMULARY OR COMPOUNDED ITEM See pharmacy note 05/23/17   Hyatt, Max T, DPM  omeprazole (PRILOSEC) 20 MG capsule Take by mouth daily.  09/09/16   [provider]  oseltamivir (TAMIFLU) 75 MG capsule  01/22/17   [provider]  potassium chloride SA (K-DUR,KLOR-CON) 20 MEQ tablet Take 20 mEq by mouth daily. 08/18/16   [provider]  rosuvastatin (CRESTOR) 5 MG tablet Take 5 mg by mouth every Monday, Wednesday, and Friday. 08/02/16   [provider]  TAZTIA XT 300 MG 24 hr capsule  03/15/18   [provider]  tolterodine (DETROL) 2 MG tablet Take 2 mg by mouth daily. 07/28/16   [provider]  traMADol (ULTRAM) 50 MG tablet Take 50 mg by mouth every 6 (six) hours as needed for pain. 09/16/16   [provider]  vitamin B-12 (CYANOCOBALAMIN) 1000 MCG tablet Take 1,000 mcg by mouth daily.    [provider]    Allergies Chlorpheniramine; Fluconazole; Metronidazole; Monistat  [tioconazole]; and Sulfa antibiotics  Family History  Problem Relation Age of Onset  . Stroke Mother   . Cancer Father     Social History Social History   Tobacco Use  . Smoking status: Never Smoker  . Smokeless tobacco: Never Used  Substance Use Topics  . Alcohol use: No  . Drug use: No     Review of Systems  Constitutional: No fever/chills Eyes: No visual changes. No  discharge ENT: No upper respiratory complaints. Cardiovascular: no chest pain. Respiratory: no cough. No SOB. Gastrointestinal: No abdominal pain.  No nausea, no vomiting.  No diarrhea.  No constipation. Musculoskeletal: Patient has left foot pain.  Skin: Negative for rash, abrasions, lacerations, ecchymosis. Neurological: Negative for headaches, focal weakness or numbness.   ____________________________________________   PHYSICAL EXAM:  VITAL SIGNS: ED Triage Vitals  Enc Vitals Group     BP 07/17/18 1921 128/60     Pulse Rate 07/17/18 1921 77     Resp 07/17/18 1921 18     Temp 07/17/18 1921 97.9 F (36.6 C)     Temp Source 07/17/18 1921 Oral     SpO2 07/17/18 1921 95 %     Weight 07/17/18 1923 150 lb (68 kg)     Height 07/17/18 1923 5\' 6"  (1.676 m)     Head Circumference --      Peak Flow --  Pain Score 07/17/18 1922 8     Pain Loc --      Pain Edu? --      Excl. in [place]? --      Constitutional: Alert and oriented. Well appearing and in no acute distress. Eyes: Conjunctivae are normal. PERRL. EOMI. Head: Atraumatic. Cardiovascular: Normal rate, regular rhythm. Normal S1 and S2.  Good peripheral circulation. Respiratory: Normal respiratory effort without tachypnea or retractions. Lungs CTAB. Good air entry to the bases with no decreased or absent breath sounds. Musculoskeletal: Patient is able to move all 5 left toes. She performs limited active range of motion at the left ankle , likely secondary to pain.  No pain with palpation over the first through fifth metatarsals.  No pain with insertion over the Achilles tendon.  Patient has reproducible pain with dorsiflexion of the left foot.  Palpable dorsalis pedis pulse, left. Neurologic:  Normal speech and language. No gross focal neurologic deficits are appreciated.  Skin:  Skin is warm, dry and intact. No rash noted. Psychiatric: Mood and affect are normal. Speech and behavior are normal. Patient exhibits appropriate  insight and judgement.   ____________________________________________   LABS (all labs ordered are listed, but only abnormal results are displayed)  Labs Reviewed - No data to display ____________________________________________  EKG   ____________________________________________  RADIOLOGY    No results found.  ____________________________________________    PROCEDURES  Procedure(s) performed:    Procedures    Medications - No data to display   ____________________________________________   INITIAL IMPRESSION / ASSESSMENT AND PLAN / ED COURSE  Pertinent labs & imaging results that were available during my care of the patient were reviewed by me and considered in my medical decision making (see chart for details).  Review of the [place] CSRS was performed in accordance of the [place] prior to dispensing any controlled drugs.      Assessment and plan Left foot pain Patient presents to the emergency department with left foot pain after patient reports that she stubbed her left great toe several days ago.  Patient has reproducible pain with dorsiflexion of the left foot.  An x-ray was ordered to rule out fracture.  Plan of care at this time is to apply ace wrap and to make sure patient has a walker. She will be discharged to home with tramadol.  Patient has an appointment with podiatry on 07/24/2018.  My coworker, Betha Loa PA-C assumed patient care.    ____________________________________________  FINAL CLINICAL IMPRESSION(S) / ED DIAGNOSES  Final diagnoses:  Left foot pain      NEW MEDICATIONS STARTED DURING THIS VISIT:  ED Discharge Orders    None          This chart was dictated using voice recognition software/Dragon. Despite best efforts to proofread, errors can occur which can change the meaning. Any change was purely unintentional.    Karren Cobble 07/17/18 2034    Merlyn Lot, MD 07/17/18 [phone]

## 2018-07-17 NOTE — Progress Notes (Signed)
[name], [name] ([id number]) Visit Report for 07/03/2018 Arrival Information Details Patient Name: [name], [name] Date of Service: 07/03/2018 11:15 AM Medical Record Number: [id number] Patient Account Number: [id number] Date of Birth/Sex: 1922/12/10 (84 y.o. Female) Treating RN: Ahmed Prima Primary Care Chriselda Leppert: FITZGERALD, DAVID Other Clinician: Referring Natalie Mceuen: FITZGERALD, DAVID Treating Wiktoria Hemrick/Extender: Tito Dine in Treatment: 48 Visit Information History Since Last Visit All ordered tests and consults were completed: No Patient Arrived: Kasandra Knudsen Added or deleted any medications: No Arrival Time: 11:42 Any new allergies or adverse reactions: No Accompanied By: daughter Had a fall or experienced change in No Transfer Assistance: EasyPivot Patient activities of daily living that may affect Lift risk of falls: Patient Identification Verified: Yes Signs or symptoms of abuse/neglect since last visito No Secondary Verification Process Yes Hospitalized since last visit: No Completed: Implantable device outside of the clinic excluding No Patient Requires Transmission-Based No cellular tissue based products placed in the center Precautions: since last visit: Patient Has Alerts: Yes Has Dressing in Place as Prescribed: Yes Patient Alerts: ABI AVVS 03/29/18 L Pain Present Now: No 1.05 R 1.03, TBI L .56, R .85 Electronic Signature(s) Signed: 07/08/2018 4:59:33 PM By: Alric Quan Entered By: Alric Quan on 07/03/2018 11:43:20 Deringer, [name] ([id number]) -------------------------------------------------------------------------------- Clinic Level of Care Assessment Details Patient Name: [name] Date of Service: 07/03/2018 11:15 AM Medical Record Number: [id number] Patient Account Number: [id number] Date of Birth/Sex: 1923/10/23 (84 y.o. Female) Treating RN: Cornell Barman Primary Care Malakye Nolden: FITZGERALD, DAVID Other  Clinician: Referring Jager Koska: FITZGERALD, DAVID Treating Shakyra Mattera/Extender: Tito Dine in Treatment: 65 Clinic Level of Care Assessment Items TOOL 4 Quantity Score []  - Use when only an EandM is performed on FOLLOW-UP visit 0 ASSESSMENTS - Nursing Assessment / Reassessment []  - Reassessment of Co-morbidities (includes updates in patient status) 0 X- 1 5 Reassessment of Adherence to Treatment Plan ASSESSMENTS - Wound and Skin Assessment / Reassessment X - Simple Wound Assessment / Reassessment - one wound 1 5 []  - 0 Complex Wound Assessment / Reassessment - multiple wounds []  - 0 Dermatologic / Skin Assessment (not related to wound area) ASSESSMENTS - Focused Assessment []  - Circumferential Edema Measurements - multi extremities 0 []  - 0 Nutritional Assessment / Counseling / Intervention []  - 0 Lower Extremity Assessment (monofilament, tuning fork, pulses) []  - 0 Peripheral Arterial Disease Assessment (using hand held doppler) ASSESSMENTS - Ostomy and/or Continence Assessment and Care []  - Incontinence Assessment and Management 0 []  - 0 Ostomy Care Assessment and Management (repouching, etc.) PROCESS - Coordination of Care X - Simple Patient / Family Education for ongoing care 1 15 []  - 0 Complex (extensive) Patient / Family Education for ongoing care []  - 0 Staff obtains Programmer, systems, Records, Test Results / Process Orders []  - 0 Staff telephones HHA, Nursing Homes / Clarify orders / etc []  - 0 Routine Transfer to another Facility (non-emergent condition) []  - 0 Routine Hospital Admission (non-emergent condition) []  - 0 New Admissions / Biomedical engineer / Ordering NPWT, Apligraf, etc. []  - 0 Emergency Hospital Admission (emergent condition) X- 1 10 Simple Discharge Coordination [name], [name] ([id number]) []  - 0 Complex (extensive) Discharge Coordination PROCESS - Special Needs []  - Pediatric / Minor Patient Management 0 []  - 0 Isolation  Patient Management []  - 0 Hearing / Language / Visual special needs []  - 0 Assessment of Community assistance (transportation, D/C planning, etc.) []  - 0 Additional assistance / Altered mentation []  - 0 Support Surface(s) Assessment (bed, cushion,  seat, etc.) INTERVENTIONS - Wound Cleansing / Measurement X - Simple Wound Cleansing - one wound 1 5 []  - 0 Complex Wound Cleansing - multiple wounds X- 1 5 Wound Imaging (photographs - any number of wounds) []  - 0 Wound Tracing (instead of photographs) []  - 0 Simple Wound Measurement - one wound []  - 0 Complex Wound Measurement - multiple wounds INTERVENTIONS - Wound Dressings []  - Small Wound Dressing one or multiple wounds 0 X- 2 15 Medium Wound Dressing one or multiple wounds []  - 0 Large Wound Dressing one or multiple wounds []  - 0 Application of Medications - topical []  - 0 Application of Medications - injection INTERVENTIONS - Miscellaneous []  - External ear exam 0 []  - 0 Specimen Collection (cultures, biopsies, blood, body fluids, etc.) []  - 0 Specimen(s) / Culture(s) sent or taken to Lab for analysis []  - 0 Patient Transfer (multiple staff / Civil Service fast streamer / Similar devices) []  - 0 Simple Staple / Suture removal (25 or less) []  - 0 Complex Staple / Suture removal (26 or more) []  - 0 Hypo / Hyperglycemic Management (close monitor of Blood Glucose) []  - 0 Ankle / Brachial Index (ABI) - do not check if billed separately X- 1 5 Vital Signs [name], [name] ([id number]) Has the patient been seen at the hospital within the last three years: Yes Total Score: 80 Level Of Care: New/Established - Level 3 Electronic Signature(s) Signed: 07/08/2018 10:49:10 AM By: Gretta Cool, BSN, RN, CWS, Kim RN, BSN Entered By: Gretta Cool, BSN, RN, CWS, Kim on 07/08/2018 10:48:15 Hogsett, [name] ([id number]) -------------------------------------------------------------------------------- Encounter Discharge Information Details Patient Name:  [name] Date of Service: 07/03/2018 11:15 AM Medical Record Number: [id number] Patient Account Number: [id number] Date of Birth/Sex: 1923-11-23 (84 y.o. Female) Treating RN: Montey Hora Primary Care Kaneesha Constantino: FITZGERALD, DAVID Other Clinician: Referring Zidane Renner: FITZGERALD, DAVID Treating Delorus Langwell/Extender: Tito Dine in Treatment: 58 Encounter Discharge Information Items Discharge Condition: Stable Ambulatory Status: Cane Discharge Destination: Home Transportation: Private Auto Accompanied By: dtr Schedule Follow-up Appointment: Yes Clinical Summary of Care: Electronic Signature(s) Signed: 07/03/2018 12:48:35 PM By: Montey Hora Entered By: Montey Hora on 07/03/2018 12:48:35 [name], [name] ([id number]) -------------------------------------------------------------------------------- Lower Extremity Assessment Details Patient Name: [name] Date of Service: 07/03/2018 11:15 AM Medical Record Number: [id number] Patient Account Number: [id number] Date of Birth/Sex: May 06, 1923 (84 y.o. Female) Treating RN: Ahmed Prima Primary Care Sylva Overley: FITZGERALD, DAVID Other Clinician: Referring Gennie Dib: FITZGERALD, DAVID Treating Ricci Paff/Extender: Ricard Dillon Weeks in Treatment: 46 Edema Assessment Assessed: [Left: No] [Right: No] [Left: Edema] [Right: :] Calf Left: Right: Point of Measurement: 29 cm From Medial Instep 30 cm 29.3 cm Ankle Left: Right: Point of Measurement: 12 cm From Medial Instep 20.8 cm 21.9 cm Vascular Assessment Pulses: Dorsalis Pedis Palpable: [Left:Yes] [Right:Yes] Posterior Tibial Extremity colors, hair growth, and conditions: Extremity Color: [Left:Hyperpigmented] [Right:Hyperpigmented] Temperature of Extremity: [Left:Warm] [Right:Warm] Capillary Refill: [Left:< 3 seconds] [Right:< 3 seconds] Toe Nail Assessment Left: Right: Thick: Yes Yes Discolored: Yes Yes Deformed: Yes Yes Improper Length and  Hygiene: No No Electronic Signature(s) Signed: 07/08/2018 4:59:33 PM By: Alric Quan Entered By: Alric Quan on 07/03/2018 11:53:10 [name], [name] ([id number]) -------------------------------------------------------------------------------- Multi Wound Chart Details Patient Name: [name] Date of Service: 07/03/2018 11:15 AM Medical Record Number: [id number] Patient Account Number: [id number] Date of Birth/Sex: 1923-07-31 (84 y.o. Female) Treating RN: Cornell Barman Primary Care Aalia Greulich: FITZGERALD, DAVID Other Clinician: Referring Dainelle Hun: FITZGERALD, DAVID Treating Matisyn Cabeza/Extender: Ricard Dillon Weeks in Treatment: 46 Vital Signs  Height(in): 65 Pulse(bpm): 89 Weight(lbs): 154.3 Blood Pressure(mmHg): 138/67 Body Mass Index(BMI): 26 Temperature(F): 98.2 Respiratory Rate 18 (breaths/min): Photos: [1:No Photos] [2:No Photos] [N/A:N/A] Wound Location: [1:Right Malleolus - Lateral] [2:Left Malleolus - Lateral] [N/A:N/A] Wounding Event: [1:Gradually Appeared] [2:Gradually Appeared] [N/A:N/A] Primary Etiology: [1:Lymphedema] [2:Pressure Ulcer] [N/A:N/A] Secondary Etiology: [1:Arterial Insufficiency Ulcer] [2:N/A] [N/A:N/A] Comorbid History: [1:Cataracts, Congestive Heart Failure, Hypertension, Osteoarthritis, Neuropathy] [2:Cataracts, Congestive Heart Failure, Hypertension, Osteoarthritis, Neuropathy] [N/A:N/A] Date Acquired: [1:08/11/2015] [2:04/12/2018] [N/A:N/A] Weeks of Treatment: [1:46] [2:10] [N/A:N/A] Wound Status: [1:Open] [2:Open] [N/A:N/A] Measurements L x W x D [1:1.4x1.2x0.4] [2:1.7x1.7x0.6] [N/A:N/A] (cm) Area (cm) : [1:1.319] [2:2.27] [N/A:N/A] Volume (cm) : [1:0.528] [2:1.362] [N/A:N/A] % Reduction in Area: [1:13.90%] [2:-189.20%] [N/A:N/A] % Reduction in Volume: [1:-15.00%] [2:-1624.10%] [N/A:N/A] Classification: [1:Full Thickness With Exposed Support Structures] [2:Category/Stage II] [N/A:N/A] Exudate Amount: [1:Large] [2:Large]  [N/A:N/A] Exudate Type: [1:Serosanguineous] [2:Purulent] [N/A:N/A] Exudate Color: [1:red, brown] [2:yellow, brown, green] [N/A:N/A] Wound Margin: [1:Distinct, outline attached] [2:Flat and Intact] [N/A:N/A] Granulation Amount: [1:None Present (0%)] [2:None Present (0%)] [N/A:N/A] Necrotic Amount: [1:Large (67-100%)] [2:Large (67-100%)] [N/A:N/A] Necrotic Tissue: [1:Eschar, Adherent Slough] [2:Eschar, Adherent Slough] [N/A:N/A] Exposed Structures: [1:Fat Layer (Subcutaneous Tissue) Exposed: Yes Fascia: No Tendon: No Muscle: No Joint: No Bone: No] [2:Fat Layer (Subcutaneous Tissue) Exposed: Yes Fascia: No Tendon: No Muscle: No Joint: No Bone: No] [N/A:N/A] Epithelialization: [1:None] [2:None] [N/A:N/A] Periwound Skin Texture: [1:Excoriation: No Induration: No Callus: No] [2:Excoriation: No Induration: No Callus: No] [N/A:N/A] Crepitus: No Crepitus: No Rash: No Rash: No Scarring: No Scarring: No Periwound Skin Moisture: Maceration: Yes Maceration: Yes N/A Dry/Scaly: No Dry/Scaly: No Periwound Skin Color: Erythema: Yes Erythema: Yes N/A Rubor: Yes Atrophie Blanche: No Atrophie Blanche: No Cyanosis: No Cyanosis: No Ecchymosis: No Ecchymosis: No Hemosiderin Staining: No Hemosiderin Staining: No Mottled: No Mottled: No Pallor: No Pallor: No Rubor: No Erythema Location: Circumferential Circumferential N/A Temperature: No Abnormality No Abnormality N/A Tenderness on Palpation: Yes No N/A Wound Preparation: Ulcer Cleansing: Ulcer Cleansing: N/A Rinsed/Irrigated with Saline Rinsed/Irrigated with Saline Topical Anesthetic Applied: Topical Anesthetic Applied: Other: lidocaine 4% Other: lidocaine 4% Treatment Notes Wound #1 (Right, Lateral Malleolus) 1. Cleansed with: Clean wound with Normal Saline 2. Anesthetic Topical Lidocaine 4% cream to wound bed prior to debridement 3. Peri-wound Care: Skin Prep 4. Dressing Applied: Prisma Ag 5. Secondary Dressing Applied Bordered  Foam Dressing 7. Secured with Patient to wear own compression stockings Notes juxtalites Wound #2 (Left, Lateral Malleolus) 1. Cleansed with: Clean wound with Normal Saline 2. Anesthetic Topical Lidocaine 4% cream to wound bed prior to debridement 3. Peri-wound Care: Skin Prep 4. Dressing Applied: Prisma Ag 5. Secondary Dressing Applied Bordered Foam Dressing 7. Secured with Patient to wear own compression stockings Notes [name], [name] ([id number]) juxtalites Electronic Signature(s) Signed: 07/03/2018 6:17:43 PM By: Linton Ham MD Entered By: Linton Ham on 07/03/2018 17:12:03 [name], [name] ([id number]) -------------------------------------------------------------------------------- [place] Details Patient Name: [name], [name] Date of Service: 07/03/2018 11:15 AM Medical Record Number: [id number] Patient Account Number: [id number] Date of Birth/Sex: [birth year] (84 y.o. Female) Treating RN: Cornell Barman Primary Care Chloe Flis: FITZGERALD, DAVID Other Clinician: Referring Joannie Medine: FITZGERALD, DAVID Treating Briarrose Shor/Extender: Tito Dine in Treatment: 59 Active Inactive ` Abuse / Safety / Falls / Self Care Management Nursing Diagnoses: Potential for falls Goals: Patient will not experience any injury related to falls Date Initiated: 08/10/2017 Target Resolution Date: 11/10/2017 Goal Status: Active Interventions: Assess Activities of Daily Living upon admission and as needed Assess fall risk on admission and as needed Assess: immobility, friction, shearing, incontinence upon  admission and as needed Notes: ` Nutrition Nursing Diagnoses: Imbalanced nutrition Potential for alteratiion in Nutrition/Potential for imbalanced nutrition Goals: Patient/caregiver agrees to and verbalizes understanding of need to use nutritional supplements and/or vitamins as prescribed Date Initiated: 08/10/2017 Target Resolution Date:  12/08/2017 Goal Status: Active Interventions: Assess patient nutrition upon admission and as needed per policy Notes: ` Orientation to the Wound Care Program Nursing Diagnoses: Knowledge deficit related to the wound healing center program Goals: Patient/caregiver will verbalize understanding of the [facility] Date Initiated: 08/10/2017 Target Resolution Date: 09/08/2017 KARIANA, [name] ([id number]) Goal Status: Active Interventions: Provide education on orientation to the wound center Notes: ` Pain, Acute or Chronic Nursing Diagnoses: Pain, acute or chronic: actual or potential Potential alteration in comfort, pain Goals: Patient/caregiver will verbalize adequate pain control between visits Date Initiated: 08/10/2017 Target Resolution Date: 12/08/2017 Goal Status: Active Interventions: Complete pain assessment as per visit requirements Notes: ` Wound/Skin Impairment Nursing Diagnoses: Impaired tissue integrity Knowledge deficit related to ulceration/compromised skin integrity Goals: Ulcer/skin breakdown will have a volume reduction of 80% by week 12 Date Initiated: 08/10/2017 Target Resolution Date: 12/01/2017 Goal Status: Active Interventions: Assess patient/caregiver ability to perform ulcer/skin care regimen upon admission and as needed Notes: Electronic Signature(s) Signed: 07/05/2018 6:17:48 PM By: Gretta Cool, BSN, RN, CWS, Kim RN, BSN Entered By: Gretta Cool, BSN, RN, CWS, Kim on 07/03/2018 12:06:03 [name], [name] ([id number]) -------------------------------------------------------------------------------- Pain Assessment Details Patient Name: [name] Date of Service: 07/03/2018 11:15 AM Medical Record Number: [id number] Patient Account Number: [id number] Date of Birth/Sex: 27-May-1923 (84 y.o. Female) Treating RN: Ahmed Prima Primary Care Nahun Kronberg: FITZGERALD, DAVID Other Clinician: Referring Brady Schiller: FITZGERALD, DAVID Treating  Jsaon Yoo/Extender: Tito Dine in Treatment: 76 Active Problems Location of Pain Severity and Description of Pain Patient Has Paino No Site Locations Pain Management and Medication Current Pain Management: Electronic Signature(s) Signed: 07/08/2018 4:59:33 PM By: Alric Quan Entered By: Alric Quan on 07/03/2018 11:43:34 [name], [name] ([id number]) -------------------------------------------------------------------------------- Patient/Caregiver Education Details Patient Name: [name] Date of Service: 07/03/2018 11:15 AM Medical Record Number: [id number] Patient Account Number: [id number] Date of Birth/Gender: [birth year] (84 y.o. Female) Treating RN: Montey Hora Primary Care Physician: FITZGERALD, DAVID Other Clinician: Referring Physician: FITZGERALD, DAVID Treating Physician/Extender: Tito Dine in Treatment: 57 Education Assessment Education Provided To: Patient and Caregiver Education Topics Provided Wound/Skin Impairment: Handouts: Other: wound care as ordered Methods: Demonstration, Explain/Verbal Responses: State content correctly Electronic Signature(s) Signed: 07/03/2018 5:29:24 PM By: Montey Hora Entered By: Montey Hora on 07/03/2018 12:49:08 [name], [name] ([id number]) -------------------------------------------------------------------------------- Wound Assessment Details Patient Name: [name] Date of Service: 07/03/2018 11:15 AM Medical Record Number: [id number] Patient Account Number: [id number] Date of Birth/Sex: 1923-04-25 (84 y.o. Female) Treating RN: Ahmed Prima Primary Care Tymeshia Awan: FITZGERALD, DAVID Other Clinician: Referring Beverlyn Mcginness: FITZGERALD, DAVID Treating Tawonda Legaspi/Extender: Ricard Dillon Weeks in Treatment: 46 Wound Status Wound Number: 1 Primary Lymphedema Etiology: Wound Location: Right Malleolus - Lateral Secondary Arterial Insufficiency Ulcer Wounding Event:  Gradually Appeared Etiology: Date Acquired: 08/11/2015 Wound Status: Open Weeks Of Treatment: 46 Comorbid Cataracts, Congestive Heart Failure, Clustered Wound: No History: Hypertension, Osteoarthritis, Neuropathy Photos Photo Uploaded By: Alric Quan on 07/04/2018 08:10:08 Wound Measurements Length: (cm) 1.4 Width: (cm) 1.2 Depth: (cm) 0.4 Area: (cm) 1.319 Volume: (cm) 0.528 % Reduction in Area: 13.9% % Reduction in Volume: -15% Epithelialization: None Tunneling: No Undermining: No Wound Description Full Thickness With Exposed Support Classification: Structures Wound Margin: Distinct, outline attached Exudate Large Amount: Exudate Type: Serosanguineous  Exudate Color: red, brown Foul Odor After Cleansing: No Slough/Fibrino Yes Wound Bed Granulation Amount: None Present (0%) Exposed Structure Necrotic Amount: Large (67-100%) Fascia Exposed: No Necrotic Quality: Eschar, Adherent Slough Fat Layer (Subcutaneous Tissue) Exposed: Yes Tendon Exposed: No Muscle Exposed: No Joint Exposed: No Bone Exposed: No S[place], [name] ([id number]) Periwound Skin Texture Texture Color No Abnormalities Noted: No No Abnormalities Noted: No Callus: No Atrophie Blanche: No Crepitus: No Cyanosis: No Excoriation: No Ecchymosis: No Induration: No Erythema: Yes Rash: No Erythema Location: Circumferential Scarring: No Hemosiderin Staining: No Mottled: No Moisture Pallor: No No Abnormalities Noted: No Rubor: Yes Dry / Scaly: No Maceration: Yes Temperature / Pain Temperature: No Abnormality Tenderness on Palpation: Yes Wound Preparation Ulcer Cleansing: Rinsed/Irrigated with Saline Topical Anesthetic Applied: Other: lidocaine 4%, Electronic Signature(s) Signed: 07/08/2018 4:59:33 PM By: Alric Quan Entered By: Alric Quan on 07/03/2018 11:49:36 Kulzer, [name]  ([id number]) -------------------------------------------------------------------------------- Wound Assessment Details Patient Name: [name] Date of Service: 07/03/2018 11:15 AM Medical Record Number: [id number] Patient Account Number: [id number] Date of Birth/Sex: 1923-10-02 (84 y.o. Female) Treating RN: Ahmed Prima Primary Care Gabriele Loveland: FITZGERALD, DAVID Other Clinician: Referring Joanann Mies: FITZGERALD, DAVID Treating Quintin Hjort/Extender: Ricard Dillon Weeks in Treatment: 46 Wound Status Wound Number: 2 Primary Pressure Ulcer Etiology: Wound Location: Left Malleolus - Lateral Wound Open Wounding Event: Gradually Appeared Status: Date Acquired: 04/12/2018 Comorbid Cataracts, Congestive Heart Failure, Weeks Of Treatment: 10 History: Hypertension, Osteoarthritis, Neuropathy Clustered Wound: No Photos Photo Uploaded By: Alric Quan on 07/04/2018 08:10:09 Wound Measurements Length: (cm) 1.7 Width: (cm) 1.7 Depth: (cm) 0.6 Area: (cm) 2.27 Volume: (cm) 1.362 % Reduction in Area: -189.2% % Reduction in Volume: -1624.1% Epithelialization: None Tunneling: No Undermining: No Wound Description Classification: Category/Stage II Wound Margin: Flat and Intact Exudate Amount: Large Exudate Type: Purulent Exudate Color: yellow, brown, green Foul Odor After Cleansing: No Slough/Fibrino Yes Wound Bed Granulation Amount: None Present (0%) Exposed Structure Necrotic Amount: Large (67-100%) Fascia Exposed: No Necrotic Quality: Eschar, Adherent Slough Fat Layer (Subcutaneous Tissue) Exposed: Yes Tendon Exposed: No Muscle Exposed: No Joint Exposed: No Bone Exposed: No Periwound Skin Texture [place], [name] ([id number]) Texture Color No Abnormalities Noted: No No Abnormalities Noted: No Callus: No Atrophie Blanche: No Crepitus: No Cyanosis: No Excoriation: No Ecchymosis: No Induration: No Erythema: Yes Rash: No Erythema Location:  Circumferential Scarring: No Hemosiderin Staining: No Mottled: No Moisture Pallor: No No Abnormalities Noted: No Rubor: No Dry / Scaly: No Maceration: Yes Temperature / Pain Temperature: No Abnormality Wound Preparation Ulcer Cleansing: Rinsed/Irrigated with Saline Topical Anesthetic Applied: Other: lidocaine 4%, Electronic Signature(s) Signed: 07/08/2018 4:59:33 PM By: Alric Quan Entered By: Alric Quan on 07/03/2018 11:50:47 [place], [name] ([id number]) -------------------------------------------------------------------------------- [place] Details Patient Name: [name] Date of Service: 07/03/2018 11:15 AM Medical Record Number: [id number] Patient Account Number: [id number] Date of Birth/Sex: 04-May-1923 (84 y.o. Female) Treating RN: Ahmed Prima Primary Care Nikky Duba: FITZGERALD, DAVID Other Clinician: Referring Khamille Beynon: FITZGERALD, DAVID Treating Areya Lemmerman/Extender: Ricard Dillon Weeks in Treatment: 46 Vital Signs Time Taken: 11:43 Temperature (F): 98.2 Height (in): 65 Pulse (bpm): 89 Weight (lbs): 154.3 Respiratory Rate (breaths/min): 18 Body Mass Index (BMI): 25.7 Blood Pressure (mmHg): 138/67 Reference Range: 80 - 120 mg / dl Electronic Signature(s) Signed: 07/08/2018 4:59:33 PM By: Alric Quan Entered By: Alric Quan on 07/03/2018 11:44:01

## 2018-07-19 ENCOUNTER — Other Ambulatory Visit: Payer: Self-pay | Admitting: Physician Assistant

## 2018-07-19 ENCOUNTER — Encounter: Payer: Medicare Other | Admitting: Physician Assistant

## 2018-07-19 DIAGNOSIS — L89524 Pressure ulcer of left ankle, stage 4: Secondary | ICD-10-CM

## 2018-07-22 NOTE — Progress Notes (Signed)
[name], [name] ([id number]) Visit Report for 07/10/2018 Arrival Information Details Patient Name: [name], [name] Date of Service: 07/10/2018 10:30 AM Medical Record Number: [id number] Patient Account Number: [id number] Date of Birth/Sex: Sep 10, 1923 (84 y.o. Female) Treating RN: Montey Hora Primary Care Allan Bacigalupi: FITZGERALD, DAVID Other Clinician: Referring Gurshaan Matsuoka: FITZGERALD, DAVID Treating Olanda Downie/Extender: Cathie Olden in Treatment: 6 Visit Information History Since Last Visit Added or deleted any medications: No Patient Arrived: Ambulatory Any new allergies or adverse reactions: No Arrival Time: 10:42 Had a fall or experienced change in No Accompanied By: self activities of daily living that may affect Transfer Assistance: None risk of falls: Patient Identification Verified: Yes Signs or symptoms of abuse/neglect since last visito No Secondary Verification Process Yes Hospitalized since last visit: No Completed: Implantable device outside of the clinic excluding No Patient Requires Transmission-Based No cellular tissue based products placed in the center Precautions: since last visit: Patient Has Alerts: Yes Has Dressing in Place as Prescribed: Yes Patient Alerts: ABI AVVS 03/29/18 L Has Compression in Place as Prescribed: Yes 1.05 Pain Present Now: No R 1.03, TBI L .56, R .85 Electronic Signature(s) Signed: 07/10/2018 3:43:39 PM By: Montey Hora Entered By: Montey Hora on 07/10/2018 10:44:13 Schaffert, [name] ([id number]) -------------------------------------------------------------------------------- Encounter Discharge Information Details Patient Name: [name] Date of Service: 07/10/2018 10:30 AM Medical Record Number: [id number] Patient Account Number: [id number] Date of Birth/Sex: July 07, 1923 (84 y.o. Female) Treating RN: Ahmed Prima Primary Care Keola Heninger: FITZGERALD, DAVID Other Clinician: Referring Talmage Teaster: FITZGERALD,  DAVID Treating Daenerys Buttram/Extender: Cathie Olden in Treatment: 37 Encounter Discharge Information Items Discharge Condition: Stable Ambulatory Status: Cane Discharge Destination: Home Transportation: Private Auto Accompanied By: daughter Schedule Follow-up Appointment: Yes Clinical Summary of Care: Electronic Signature(s) Signed: 07/11/2018 4:46:33 PM By: Alric Quan Entered By: Alric Quan on 07/10/2018 11:24:31 Hackbart, [name] ([id number]) -------------------------------------------------------------------------------- Lower Extremity Assessment Details Patient Name: [name] Date of Service: 07/10/2018 10:30 AM Medical Record Number: [id number] Patient Account Number: [id number] Date of Birth/Sex: [birth year] (84 y.o. Female) Treating RN: Montey Hora Primary Care Griselda Tosh: FITZGERALD, DAVID Other Clinician: Referring Yaiza Palazzola: FITZGERALD, DAVID Treating Jamirra Curnow/Extender: Cathie Olden in Treatment: 47 Edema Assessment Assessed: [Left: No] [Right: No] [Left: Edema] [Right: :] Calf Left: Right: Point of Measurement: 29 cm From Medial Instep 29.9 cm 29.6 cm Ankle Left: Right: Point of Measurement: 12 cm From Medial Instep 21.1 cm 22.2 cm Vascular Assessment Pulses: Dorsalis Pedis Palpable: [Left:Yes] [Right:Yes] Posterior Tibial Extremity colors, hair growth, and conditions: Extremity Color: [Left:Hyperpigmented] [Right:Hyperpigmented] Hair Growth on Extremity: [Left:No] [Right:No] Temperature of Extremity: [Left:Warm] [Right:Warm] Capillary Refill: [Left:< 3 seconds] [Right:< 3 seconds] Toe Nail Assessment Left: Right: Thick: Yes Yes Discolored: Yes Yes Deformed: Yes Yes Improper Length and Hygiene: No No Electronic Signature(s) Signed: 07/10/2018 3:43:39 PM By: Montey Hora Entered By: Montey Hora on 07/10/2018 10:51:49 Belote, [name]  ([id number]) -------------------------------------------------------------------------------- Multi Wound Chart Details Patient Name: [name] Date of Service: 07/10/2018 10:30 AM Medical Record Number: [id number] Patient Account Number: [id number] Date of Birth/Sex: 10-20-1923 (84 y.o. Female) Treating RN: Cornell Barman Primary Care Lyris Hitchman: FITZGERALD, DAVID Other Clinician: Referring Encarnacion Bole: FITZGERALD, DAVID Treating Gill Delrossi/Extender: Lawanda Cousins Weeks in Treatment: 23 Vital Signs Height(in): 65 Pulse(bpm): 85 Weight(lbs): 154.3 Blood Pressure(mmHg): 133/56 Body Mass Index(BMI): 26 Temperature(F): 98.3 Respiratory Rate 16 (breaths/min): Photos: [1:No Photos] [2:No Photos] [N/A:N/A] Wound Location: [1:Right Malleolus - Lateral] [2:Left Malleolus - Lateral] [N/A:N/A] Wounding Event: [1:Gradually Appeared] [2:Gradually Appeared] [N/A:N/A] Primary Etiology: [1:Lymphedema] [2:Pressure Ulcer] [N/A:N/A] Secondary Etiology: [1:Arterial Insufficiency Ulcer] [2:N/A] [  N/A:N/A] Comorbid History: [1:Cataracts, Congestive Heart Failure, Hypertension, Osteoarthritis, Neuropathy] [2:Cataracts, Congestive Heart Failure, Hypertension, Osteoarthritis, Neuropathy] [N/A:N/A] Date Acquired: [1:08/11/2015] [2:04/12/2018] [N/A:N/A] Weeks of Treatment: [1:47] [2:11] [N/A:N/A] Wound Status: [1:Open] [2:Open] [N/A:N/A] Measurements L x W x D [1:1.6x1.6x0.8] [2:2.3x1.7x0.5] [N/A:N/A] (cm) Area (cm) : [1:2.011] [2:3.071] [N/A:N/A] Volume (cm) : [7:9.024] [2:1.535] [N/A:N/A] % Reduction in Area: [1:-31.30%] [2:-291.20%] [N/A:N/A] % Reduction in Volume: [1:-250.30%] [2:-1843.00%] [N/A:N/A] Classification: [1:Full Thickness With Exposed Support Structures] [2:Category/Stage II] [N/A:N/A] Exudate Amount: [1:Large] [2:Large] [N/A:N/A] Exudate Type: [1:Serosanguineous] [2:Purulent] [N/A:N/A] Exudate Color: [1:red, brown] [2:yellow, brown, green] [N/A:N/A] Wound Margin: [1:Distinct, outline  attached] [2:Flat and Intact] [N/A:N/A] Granulation Amount: [1:Small (1-33%)] [2:None Present (0%)] [N/A:N/A] Granulation Quality: [1:Pink] [2:N/A] [N/A:N/A] Necrotic Amount: [1:Large (67-100%)] [2:Large (67-100%)] [N/A:N/A] Necrotic Tissue: [1:Eschar, Adherent Slough] [2:Eschar, Adherent Slough] [N/A:N/A] Exposed Structures: [1:Fat Layer (Subcutaneous Tissue) Exposed: Yes Fascia: No Tendon: No Muscle: No Joint: No Bone: No] [2:Fat Layer (Subcutaneous Tissue) Exposed: Yes Fascia: No Tendon: No Muscle: No Joint: No Bone: No] [N/A:N/A] Epithelialization: [1:None] [2:None] [N/A:N/A] Debridement: [1:Debridement - Excisional 11:04] [2:Debridement - Excisional 11:04] [N/A:N/A N/A] Pre-procedure Verification/Time Out Taken: Pain Control: Other Other N/A Tissue Debrided: Subcutaneous Subcutaneous N/A Level: Skin/Subcutaneous Tissue Skin/Subcutaneous Tissue N/A Debridement Area (sq cm): 2.56 3.91 N/A Instrument: Blade, Forceps Blade, Forceps N/A Bleeding: None None N/A Debridement Treatment Procedure was tolerated well Procedure was tolerated well N/A Response: Post Debridement 1.6x1.6x0.8 2.3x1.7x0.5 N/A Measurements L x W x D (cm) Post Debridement Volume: 1.608 1.535 N/A (cm) Post Debridement Stage: N/A Category/Stage II N/A Periwound Skin Texture: Excoriation: No Excoriation: No N/A Induration: No Induration: No Callus: No Callus: No Crepitus: No Crepitus: No Rash: No Rash: No Scarring: No Scarring: No Periwound Skin Moisture: Maceration: Yes Maceration: Yes N/A Dry/Scaly: No Dry/Scaly: No Periwound Skin Color: Erythema: Yes Erythema: Yes N/A Rubor: Yes Atrophie Blanche: No Atrophie Blanche: No Cyanosis: No Cyanosis: No Ecchymosis: No Ecchymosis: No Hemosiderin Staining: No Hemosiderin Staining: No Mottled: No Mottled: No Pallor: No Pallor: No Rubor: No Erythema Location: Circumferential Circumferential N/A Temperature: No Abnormality No Abnormality  N/A Tenderness on Palpation: Yes No N/A Wound Preparation: Ulcer Cleansing: Ulcer Cleansing: N/A Rinsed/Irrigated with Saline Rinsed/Irrigated with Saline Topical Anesthetic Applied: Topical Anesthetic Applied: Other: lidocaine 4% Other: lidocaine 4% Procedures Performed: Debridement Debridement N/A Treatment Notes Electronic Signature(s) Signed: 07/10/2018 11:20:07 AM By: Lawanda Cousins Entered By: Lawanda Cousins on 07/10/2018 11:20:06 Wynes, [name] ([id number]) -------------------------------------------------------------------------------- [place] Details Patient Name: [name] Date of Service: 07/10/2018 10:30 AM Medical Record Number: [id number] Patient Account Number: [id number] Date of Birth/Sex: 20-May-1923 (84 y.o. Female) Treating RN: Cornell Barman Primary Care Ansel Ferrall: FITZGERALD, DAVID Other Clinician: Referring Abhiram Criado: FITZGERALD, DAVID Treating Haniel Fix/Extender: Cathie Olden in Treatment: 60 Active Inactive ` Abuse / Safety / Falls / Self Care Management Nursing Diagnoses: Potential for falls Goals: Patient will not experience any injury related to falls Date Initiated: 08/10/2017 Target Resolution Date: 11/10/2017 Goal Status: Active Interventions: Assess Activities of Daily Living upon admission and as needed Assess fall risk on admission and as needed Assess: immobility, friction, shearing, incontinence upon admission and as needed Notes: ` Nutrition Nursing Diagnoses: Imbalanced nutrition Potential for alteratiion in Nutrition/Potential for imbalanced nutrition Goals: Patient/caregiver agrees to and verbalizes understanding of need to use nutritional supplements and/or vitamins as prescribed Date Initiated: 08/10/2017 Target Resolution Date: 12/08/2017 Goal Status: Active Interventions: Assess patient nutrition upon admission and as needed per policy Notes: ` Orientation to the Wound Care Program Nursing  Diagnoses: Knowledge deficit related to  the wound healing center program Goals: Patient/caregiver will verbalize understanding of the [place] Date Initiated: 08/10/2017 Target Resolution Date: 09/08/2017 EDITA, WEYENBERG ([id number]) Goal Status: Active Interventions: Provide education on orientation to the wound center Notes: ` Pain, Acute or Chronic Nursing Diagnoses: Pain, acute or chronic: actual or potential Potential alteration in comfort, pain Goals: Patient/caregiver will verbalize adequate pain control between visits Date Initiated: 08/10/2017 Target Resolution Date: 12/08/2017 Goal Status: Active Interventions: Complete pain assessment as per visit requirements Notes: ` Wound/Skin Impairment Nursing Diagnoses: Impaired tissue integrity Knowledge deficit related to ulceration/compromised skin integrity Goals: Ulcer/skin breakdown will have a volume reduction of 80% by week 12 Date Initiated: 08/10/2017 Target Resolution Date: 12/01/2017 Goal Status: Active Interventions: Assess patient/caregiver ability to perform ulcer/skin care regimen upon admission and as needed Notes: Electronic Signature(s) Signed: 07/10/2018 5:11:54 PM By: Gretta Cool, BSN, RN, CWS, Kim RN, BSN Entered By: Gretta Cool, BSN, RN, CWS, Kim on 07/10/2018 11:04:00 Marion, [name] ([id number]) -------------------------------------------------------------------------------- Pain Assessment Details Patient Name: [name] Date of Service: 07/10/2018 10:30 AM Medical Record Number: [id number] Patient Account Number: [id number] Date of Birth/Sex: 09-Jan-1923 (84 y.o. Female) Treating RN: Montey Hora Primary Care Tiffanni Scarfo: FITZGERALD, DAVID Other Clinician: Referring Chanika Byland: FITZGERALD, DAVID Treating Jamelle Noy/Extender: Cathie Olden in Treatment: 68 Active Problems Location of Pain Severity and Description of Pain Patient Has Paino No Site Locations Pain Management and  Medication Current Pain Management: Electronic Signature(s) Signed: 07/10/2018 3:43:39 PM By: Montey Hora Entered By: Montey Hora on 07/10/2018 10:44:20 Huckeby, [name] ([id number]) -------------------------------------------------------------------------------- Patient/Caregiver Education Details Patient Name: [name] Date of Service: 07/10/2018 10:30 AM Medical Record Number: [id number] Patient Account Number: [id number] Date of Birth/Gender: Aug 17, 1923 (84 y.o. Female) Treating RN: Ahmed Prima Primary Care Physician: FITZGERALD, DAVID Other Clinician: Referring Physician: FITZGERALD, DAVID Treating Physician/Extender: Cathie Olden in Treatment: 85 Education Assessment Education Provided To: Patient Education Topics Provided Wound/Skin Impairment: Handouts: Caring for Your Ulcer, Skin Care Do's and Dont's, Other: change dressing as ordered Methods: Demonstration, Explain/Verbal Responses: State content correctly Electronic Signature(s) Signed: 07/11/2018 4:46:33 PM By: Alric Quan Entered By: Alric Quan on 07/10/2018 11:24:46 Yanke, [name] ([id number]) -------------------------------------------------------------------------------- Wound Assessment Details Patient Name: [name] Date of Service: 07/10/2018 10:30 AM Medical Record Number: [id number] Patient Account Number: [id number] Date of Birth/Sex: 1922/12/26 (84 y.o. Female) Treating RN: Montey Hora Primary Care Chabely Norby: FITZGERALD, DAVID Other Clinician: Referring Jahziel Sinn: FITZGERALD, DAVID Treating Navea Woodrow/Extender: Lawanda Cousins Weeks in Treatment: 47 Wound Status Wound Number: 1 Primary Pressure Ulcer Etiology: Wound Location: Right Malleolus - Lateral Secondary Arterial Insufficiency Ulcer Wounding Event: Gradually Appeared Etiology: Date Acquired: 08/11/2015 Wound Status: Open Weeks Of Treatment: 47 Comorbid Cataracts, Congestive Heart  Failure, Clustered Wound: No History: Hypertension, Osteoarthritis, Neuropathy Photos Wound Measurements Length: (cm) 1.6 % Reductio Width: (cm) 1.6 % Reductio Depth: (cm) 0.8 Epithelial Area: (cm) 2.011 Tunneling Volume: (cm) 1.608 Undermini n in Area: -31.3% n in Volume: -250.3% ization: None : No ng: No Wound Description Classification: Category/Stage IV Foul Odor Wound Margin: Distinct, outline attached Slough/Fib Exudate Amount: Large Exudate Type: Serosanguineous Exudate Color: red, brown After Cleansing: No rino Yes Wound Bed Granulation Amount: Small (1-33%) Exposed Structure Granulation Quality: Pink Fascia Exposed: No Necrotic Amount: Large (67-100%) Fat Layer (Subcutaneous Tissue) Exposed: Yes Necrotic Quality: Eschar, Adherent Slough Tendon Exposed: No Muscle Exposed: No Joint Exposed: No Bone Exposed: No Periwound Skin Texture Texture Color Hopke, [name] ([id number]) No Abnormalities Noted: No No Abnormalities Noted: No Callus: No Atrophie Blanche:  No Crepitus: No Cyanosis: No Excoriation: No Ecchymosis: No Induration: No Erythema: Yes Rash: No Erythema Location: Circumferential Scarring: No Hemosiderin Staining: No Mottled: No Moisture Pallor: No No Abnormalities Noted: No Rubor: Yes Dry / Scaly: No Maceration: Yes Temperature / Pain Temperature: No Abnormality Tenderness on Palpation: Yes Wound Preparation Ulcer Cleansing: Rinsed/Irrigated with Saline Topical Anesthetic Applied: Other: lidocaine 4%, Treatment Notes Wound #1 (Right, Lateral Malleolus) 1. Cleansed with: Clean wound with Normal Saline 2. Anesthetic Topical Lidocaine 4% cream to wound bed prior to debridement 3. Peri-wound Care: Skin Prep 4. Dressing Applied: Iodoflex 5. Secondary Dressing Applied Bordered Foam Dressing Electronic Signature(s) Signed: 07/10/2018 12:56:58 PM By: Gretta Cool, BSN, RN, CWS, Kim RN, BSN Signed: 07/10/2018 3:43:39 PM By: Montey Hora Entered By: Gretta Cool BSN, RN, CWS, Kim on 07/10/2018 12:56:57 Anger, [name] ([id number]) -------------------------------------------------------------------------------- Wound Assessment Details Patient Name: [name] Date of Service: 07/10/2018 10:30 AM Medical Record Number: [id number] Patient Account Number: [id number] Date of Birth/Sex: 06-07-1923 (84 y.o. Female) Treating RN: Montey Hora Primary Care Amelda Hapke: FITZGERALD, DAVID Other Clinician: Referring Linea Calles: FITZGERALD, DAVID Treating Dareen Gutzwiller/Extender: Lawanda Cousins Weeks in Treatment: 47 Wound Status Wound Number: 2 Primary Pressure Ulcer Etiology: Wound Location: Left Malleolus - Lateral Wound Open Wounding Event: Gradually Appeared Status: Date Acquired: 04/12/2018 Comorbid Cataracts, Congestive Heart Failure, Weeks Of Treatment: 11 History: Hypertension, Osteoarthritis, Neuropathy Clustered Wound: No Photos Wound Measurements Length: (cm) 2.3 Width: (cm) 1.7 Depth: (cm) 0.5 Area: (cm) 3.071 Volume: (cm) 1.535 % Reduction in Area: -291.2% % Reduction in Volume: -1843% Epithelialization: None Tunneling: No Undermining: No Wound Description Classification: Category/Stage IV Foul Odor A Wound Margin: Flat and Intact Slough/Fibr Exudate Amount: Large Exudate Type: Purulent Exudate Color: yellow, brown, green fter Cleansing: No ino Yes Wound Bed Granulation Amount: None Present (0%) Exposed Structure Necrotic Amount: Large (67-100%) Fascia Exposed: No Necrotic Quality: Eschar, Adherent Slough Fat Layer (Subcutaneous Tissue) Exposed: Yes Tendon Exposed: No Muscle Exposed: No Joint Exposed: No Bone Exposed: No Periwound Skin Texture Texture Color Lusher, [name] ([id number]) No Abnormalities Noted: No No Abnormalities Noted: No Callus: No Atrophie Blanche: No Crepitus: No Cyanosis: No Excoriation: No Ecchymosis: No Induration: No Erythema: Yes Rash: No Erythema  Location: Circumferential Scarring: No Hemosiderin Staining: No Mottled: No Moisture Pallor: No No Abnormalities Noted: No Rubor: No Dry / Scaly: No Maceration: Yes Temperature / Pain Temperature: No Abnormality Wound Preparation Ulcer Cleansing: Rinsed/Irrigated with Saline Topical Anesthetic Applied: Other: lidocaine 4%, Electronic Signature(s) Signed: 07/10/2018 12:54:49 PM By: Gretta Cool, BSN, RN, CWS, Kim RN, BSN Signed: 07/10/2018 3:43:39 PM By: Montey Hora Entered By: Gretta Cool BSN, RN, CWS, Kim on 07/10/2018 12:54:49 Neubert, [name] ([id number]) -------------------------------------------------------------------------------- Vitals Details Patient Name: [name] Date of Service: 07/10/2018 10:30 AM Medical Record Number: [id number] Patient Account Number: [id number] Date of Birth/Sex: March 02, 1923 (84 y.o. Female) Treating RN: Montey Hora Primary Care Antero Derosia: FITZGERALD, DAVID Other Clinician: Referring Shadai Mcclane: FITZGERALD, DAVID Treating Zayne Marovich/Extender: Cathie Olden in Treatment: 29 Vital Signs Time Taken: 10:47 Temperature (F): 98.3 Height (in): 65 Pulse (bpm): 85 Weight (lbs): 154.3 Respiratory Rate (breaths/min): 16 Body Mass Index (BMI): 25.7 Blood Pressure (mmHg): 133/56 Reference Range: 80 - 120 mg / dl Electronic Signature(s) Signed: 07/10/2018 3:43:39 PM By: Montey Hora Entered By: Montey Hora on 07/10/2018 10:47:37

## 2018-07-23 ENCOUNTER — Ambulatory Visit (INDEPENDENT_AMBULATORY_CARE_PROVIDER_SITE_OTHER): Payer: Medicare Other

## 2018-07-23 ENCOUNTER — Ambulatory Visit (INDEPENDENT_AMBULATORY_CARE_PROVIDER_SITE_OTHER): Payer: Medicare Other | Admitting: Vascular Surgery

## 2018-07-23 ENCOUNTER — Encounter (INDEPENDENT_AMBULATORY_CARE_PROVIDER_SITE_OTHER): Payer: Self-pay | Admitting: Vascular Surgery

## 2018-07-23 VITALS — BP 134/78 | HR 85 | Resp 16 | Ht 66.0 in | Wt 144.0 lb

## 2018-07-23 DIAGNOSIS — L97319 Non-pressure chronic ulcer of right ankle with unspecified severity: Secondary | ICD-10-CM

## 2018-07-23 DIAGNOSIS — I83013 Varicose veins of right lower extremity with ulcer of ankle: Secondary | ICD-10-CM

## 2018-07-23 DIAGNOSIS — E785 Hyperlipidemia, unspecified: Secondary | ICD-10-CM

## 2018-07-23 DIAGNOSIS — I480 Paroxysmal atrial fibrillation: Secondary | ICD-10-CM | POA: Diagnosis not present

## 2018-07-23 DIAGNOSIS — I739 Peripheral vascular disease, unspecified: Secondary | ICD-10-CM

## 2018-07-23 DIAGNOSIS — I7025 Atherosclerosis of native arteries of other extremities with ulceration: Secondary | ICD-10-CM | POA: Diagnosis not present

## 2018-07-23 NOTE — Assessment & Plan Note (Signed)
Her ABIs today are 1.26 on the right and 0.95 on the left with fairly good waveforms.  Her perfusion is stable and not reduced from previous studies and her digital waveforms are pretty good particularly on the right leg. I really do not think there is much I can do to improve her perfusion and her perfusion should be adequate for wound healing.  She is seeing the wound care center who does an excellent job.  There may be some benefit to doing a synthetic skin graft if there is no underlying infection.  She reports they have ordered an MRI to evaluate for that which sounds very reasonable to me.  I will recheck her perfusion in 6 months.

## 2018-07-23 NOTE — Progress Notes (Signed)
MRN : [id number]  [name] is a 84 y.o. ([birth year]) female who presents with chief complaint of  Chief Complaint  Patient presents with  . Follow-up    increase ulcers  .  History of Present Illness: Patient returns today in follow up of her extremity ulcerations.  One on the right leg is really been present for about 3 years despite revascularization a year and a half ago.  Her left leg ulceration is also long-standing.  These are on the lateral ankle bilaterally.  Her ABIs today are 1.26 on the right and 0.95 on the left with fairly good waveforms.  Her perfusion is stable and not reduced from previous studies and her digital waveforms are pretty good particularly on the right leg.  Current Outpatient Medications  Medication Sig Dispense Refill  . acetaminophen (TYLENOL) 650 MG CR tablet Take 650 mg by mouth every 8 (eight) hours as needed for pain.    Marland Kitchen aspirin EC 81 MG tablet Take 81 mg by mouth daily.    . Biotin 1000 MCG tablet Take 1,000 mcg by mouth daily.    . calcium carbonate (OSCAL) 1500 (600 Ca) MG TABS tablet Take 600 mg of elemental calcium by mouth daily.    Marland Kitchen CARTIA XT 300 MG 24 hr capsule Take 300 mg by mouth daily.     . Cholecalciferol (VITAMIN D) 2000 units CAPS Take 2,000 Units by mouth daily.    . dabigatran (PRADAXA) 150 MG CAPS capsule Take 150 mg by mouth 2 (two) times daily.    . fluticasone (FLONASE) 50 MCG/ACT nasal spray Place into the nose.    . furosemide (LASIX) 40 MG tablet Take 40 mg by mouth daily.    . Glucosamine 500 MG CAPS Take 500 mg by mouth daily.    Marland Kitchen levothyroxine (SYNTHROID, LEVOTHROID) 50 MCG tablet Take 50 mcg by mouth daily before breakfast.     . losartan (COZAAR) 50 MG tablet Take 50 mg by mouth daily.    . magnesium oxide (MAG-OX) 400 MG tablet Take 400 mg by mouth daily.    . metoprolol (LOPRESSOR) 50 MG tablet Take 50 mg by mouth 2 (two) times daily.    . Multiple Vitamin (MULTIVITAMIN WITH MINERALS) TABS tablet Take 1  tablet by mouth daily.    . Multiple Vitamins-Minerals (PRESERVISION/LUTEIN PO) Take 1 capsule by mouth daily.    . mupirocin ointment (BACTROBAN) 2 % Apply to wound after soaking BID 30 g 1  . NONFORMULARY OR COMPOUNDED ITEM See pharmacy note 120 each 2  . omeprazole (PRILOSEC) 20 MG capsule Take by mouth daily.     Marland Kitchen oseltamivir (TAMIFLU) 75 MG capsule     . potassium chloride SA (K-DUR,KLOR-CON) 20 MEQ tablet Take 20 mEq by mouth daily.    . predniSONE (DELTASONE) 50 MG tablet Take 1 tablet (50 mg total) by mouth daily with breakfast. 5 tablet 0  . rosuvastatin (CRESTOR) 5 MG tablet Take 5 mg by mouth every Monday, Wednesday, and Friday.    Marland Kitchen TAZTIA XT 300 MG 24 hr capsule     . tolterodine (DETROL) 2 MG tablet Take 2 mg by mouth daily.    . traMADol (ULTRAM) 50 MG tablet Take 50 mg by mouth every 6 (six) hours as needed for pain.    . vitamin B-12 (CYANOCOBALAMIN) 1000 MCG tablet Take 1,000 mcg by mouth daily.     No current facility-administered medications for this visit.     Past Medical  History:  Diagnosis Date  . Peripheral vascular disease ([place])   . Varicose veins     Past Surgical History:  Procedure Laterality Date  . APPENDECTOMY    . BASAL CELL CARCINOMA EXCISION Left   . BREAST CYST EXCISION Left   . CATARACT EXTRACTION Left   . CHOLECYSTECTOMY    . EYE SURGERY    . PACEMAKER PLACEMENT    . PERIPHERAL VASCULAR CATHETERIZATION Right 10/09/2016   Procedure: Lower Extremity Angiography;  Surgeon: Algernon Huxley, MD;  Location: [facility];  Service: Cardiovascular;  Laterality: Right;  . PERIPHERAL VASCULAR CATHETERIZATION  10/09/2016   Procedure: Lower Extremity Intervention;  Surgeon: Algernon Huxley, MD;  Location: [facility];  Service: Cardiovascular;;  . TONSILLECTOMY     Family History  Problem Relation Age of Onset  . Stroke Mother   . Cancer Father    No bleeding or clotting disorders  Social History     Social History  Substance  Use Topics  . Smoking status: Never Smoker  . Smokeless tobacco: Not on file  . Alcohol use No  Recently widowed      Allergies  Allergen Reactions  . Metronidazole Nausea And Vomiting, Other (See Comments) and Rash  . Monistat [Tioconazole] Rash and Other (See Comments)  . Sulfa Antibiotics Rash and Nausea And Vomiting             REVIEW OF SYSTEMS(Negative unless checked)  Constitutional: [] Weight loss[] Fever[] Chills Cardiac:[] Chest pain[] Chest pressure[x] Palpitations [] Shortness of breath when laying flat [] Shortness of breath at rest [] Shortness of breath with exertion. Vascular: [] Pain in legs with walking[] Pain in legsat rest[] Pain in legs when laying flat [] Claudication [] Pain in feet when walking [] Pain in feet at rest [] Pain in feet when laying flat [] History of DVT [] Phlebitis [x] Swelling in legs [x] Varicose veins [x] Non-healing ulcers Pulmonary: [] Uses home oxygen [] Productive cough[] Hemoptysis [] Wheeze [] COPD [] Asthma Neurologic: [] Dizziness [] Blackouts [] Seizures [] History of stroke [] History of TIA[] Aphasia [] Temporary blindness[] Dysphagia [] Weaknessor numbness in arms [] Weakness or numbnessin legs Musculoskeletal: [] Arthritis [] Joint swelling [] Joint pain [] Low back pain Hematologic:[] Easy bruising[] Easy bleeding [] Hypercoagulable state [] Anemic [] Hepatitis Gastrointestinal:[] Blood in stool[] Vomiting blood[] Gastroesophageal reflux/heartburn[] Abdominal pain Genitourinary: [] Chronic kidney disease [] Difficulturination [] Frequenturination [] Burning with urination[] Hematuria Skin: [] Rashes [x] Ulcers [x] Wounds Psychological: [] History of anxiety[] History of major depression.    Physical Examination  BP 134/78 (BP Location: Right Arm)   Pulse 85   Resp 16   Ht 5\' 6"  (1.676 m)   Wt 144 lb (65.3 kg)   BMI 23.24 kg/m  Gen:  WD/WN,  NAD.  Appears younger than stated age Head: [place]/AT, No temporalis wasting. Ear/Nose/Throat: Hearing grossly intact, nares w/o erythema or drainage Eyes: Conjunctiva clear. Sclera non-icteric Neck: Supple.  Trachea midline Pulmonary:  Good air movement, no use of accessory muscles.  Cardiac: Irregular Vascular:  Vessel Right Left  Radial Palpable Palpable                          PT  1+ palpable  1+ palpable  DP  2+ palpable  1+ palpable    Musculoskeletal: M/S 5/5 throughout.  No deformity or atrophy.  Trace bilateral lower extremity edema. Neurologic: Sensation grossly intact in extremities.  Symmetrical.  Speech is fluent.  Psychiatric: Judgment intact, Mood & affect appropriate for pt's clinical situation. Dermatologic: Nickel sized areas on both lateral ankles with chronic ulceration.  No drainage or surrounding erythema       Labs Recent Results (from the past 2160 hour(s))  Aerobic Culture (superficial  specimen)     Status: None   Collection Time: 05/24/18  1:12 PM  Result Value Ref Range   Specimen Description      ANKLE Performed at [facility], [address] [place], [place] [address]    Special Requests      NONE Performed at [facility], [place], [place] [address]    Gram Stain      NO WBC SEEN RARE GRAM NEGATIVE RODS RARE GRAM POSITIVE COCCI    Culture MODERATE PSEUDOMONAS AERUGINOSA    Report Status 05/27/2018 FINAL    Organism ID, Bacteria PSEUDOMONAS AERUGINOSA       Susceptibility   Pseudomonas aeruginosa - MIC*    CEFTAZIDIME 4 SENSITIVE Sensitive     CIPROFLOXACIN <=0.25 SENSITIVE Sensitive     GENTAMICIN 2 SENSITIVE Sensitive     IMIPENEM 1 SENSITIVE Sensitive     PIP/TAZO 8 SENSITIVE Sensitive     CEFEPIME 2 SENSITIVE Sensitive     * MODERATE PSEUDOMONAS AERUGINOSA    Radiology Dg Ankle Complete Left  Result Date: 07/12/2018 CLINICAL DATA:  Left ankle wound. EXAM: LEFT ANKLE COMPLETE - 3+ VIEW  COMPARISON:  Radiographs of November 19, 2015. FINDINGS: Large amount of soft tissue swelling is seen overlying lateral malleolus. This is consistent with history of open wound in this area. Slight lucency is seen involving the lateral portion of the distal left fibula which may represent artifact, but lytic destruction cannot be excluded. Joint spaces intact. No radiopaque foreign body is noted. No definite fracture or dislocation is noted. IMPRESSION: Large amount of soft tissue swelling is seen overlying lateral malleolus consistent with history of wound in this area. Slight lucency is seen involving lateral portion of distal left fibula which may represent artifact, but underlying lytic destruction or osteomyelitis cannot be excluded. Further evaluation with MRI is recommended. Electronically Signed   By: Marijo Conception, M.D.   On: 07/12/2018 16:07   Dg Foot Complete Left  Result Date: 07/17/2018 CLINICAL DATA:  Initial evaluation for acute left foot pain, soft tissue ulcer at the left ankle. EXAM: LEFT FOOT - COMPLETE 3+ VIEW COMPARISON:  Prior radiograph from 07/12/2018 FINDINGS: Soft tissue swelling with bandaging material overlies the lateral malleolus, compatible with history of ulceration within this region. No appreciable osteolysis on this examination. No acute fracture dislocation. Diffuse osteopenia. Scattered vascular calcifications within the lower leg and foot. IMPRESSION: 1. No acute osseous abnormality about the foot. 2. Soft tissue swelling with bandaging material overlying the lateral malleolus, compatible with soft tissue ulcer within this region. 3. Osteopenia. Electronically Signed   By: Jeannine Boga M.D.   On: 07/17/2018 21:05    Assessment/Plan Paroxysmal atrial fibrillation (HCC) On anticoagulation  Varicose veins of lower extremities with ulcer (HCC) Swelling better. Continue compression stockings and elevation  HLD (hyperlipidemia) lipid control important in  reducing the progression of atherosclerotic disease. Continue statin therapy  Atherosclerosis of native arteries of the extremities with ulceration (HCC) Her ABIs today are 1.26 on the right and 0.95 on the left with fairly good waveforms.  Her perfusion is stable and not reduced from previous studies and her digital waveforms are pretty good particularly on the right leg. I really do not think there is much I can do to improve her perfusion and her perfusion should be adequate for wound healing.  She is seeing the wound care center who does an excellent job.  There may be some benefit to doing a  synthetic skin graft if there is no underlying infection.  She reports they have ordered an MRI to evaluate for that which sounds very reasonable to me.  I will recheck her perfusion in 6 months.    Leotis Pain, MD  07/23/2018 5:36 PM    This note was created with Dragon medical transcription system.  Any errors from dictation are purely unintentional

## 2018-07-24 ENCOUNTER — Ambulatory Visit (INDEPENDENT_AMBULATORY_CARE_PROVIDER_SITE_OTHER): Payer: Medicare Other | Admitting: Podiatry

## 2018-07-24 ENCOUNTER — Encounter: Payer: Self-pay | Admitting: Podiatry

## 2018-07-24 DIAGNOSIS — B351 Tinea unguium: Secondary | ICD-10-CM

## 2018-07-24 DIAGNOSIS — M79676 Pain in unspecified toe(s): Secondary | ICD-10-CM | POA: Diagnosis not present

## 2018-07-24 NOTE — Progress Notes (Signed)
She presents today chief complaint of painful elongated toenails 1 through 5 bilateral.  Objective: Toenails are long thick yellow dystrophic-like mycotic painful palpation.  Neurologic sensorium is intact degenerative flexors are intact  Assessment: Pain in limb secondary onychomycosis.  Plan: Debridement of toenails 1 through 5 bilateral.

## 2018-07-26 ENCOUNTER — Other Ambulatory Visit
Admission: RE | Admit: 2018-07-26 | Discharge: 2018-07-26 | Disposition: A | Discharge status: Home / Self Care | Payer: Medicare Other | Source: Ambulatory Visit | Attending: Physician Assistant | Admitting: Physician Assistant

## 2018-07-26 ENCOUNTER — Encounter: Payer: Medicare Other | Admitting: Physician Assistant

## 2018-07-26 DIAGNOSIS — M009 Pyogenic arthritis, unspecified: Secondary | ICD-10-CM | POA: Diagnosis present

## 2018-07-26 DIAGNOSIS — L89524 Pressure ulcer of left ankle, stage 4: Secondary | ICD-10-CM | POA: Diagnosis not present

## 2018-07-29 LAB — AEROBIC CULTURE  (SUPERFICIAL SPECIMEN)

## 2018-07-29 LAB — AEROBIC CULTURE W GRAM STAIN (SUPERFICIAL SPECIMEN)

## 2018-08-01 ENCOUNTER — Encounter: Payer: Medicare Other | Admitting: Physician Assistant

## 2018-08-01 ENCOUNTER — Ambulatory Visit: Admission: RE | Admit: 2018-08-01 | Payer: Medicare Other | Source: Ambulatory Visit

## 2018-08-01 DIAGNOSIS — L89524 Pressure ulcer of left ankle, stage 4: Secondary | ICD-10-CM | POA: Diagnosis not present

## 2018-08-01 NOTE — Progress Notes (Signed)
ARY, RUDNICK ([id number]) Visit Report for 07/19/2018 Arrival Information Details Patient Name: [name], [name] Date of Service: 07/19/2018 11:00 AM Medical Record Number: [id number] Patient Account Number: [id number] Date of Birth/Sex: Dec 03, 1923 (84 y.o. F) Treating RN: Secundino Ginger Primary Care Zephyr Sausedo: Grayland Ormond Other Clinician: Referring Duvall Comes: FITZGERALD, DAVID Treating Sofiya Ezelle/Extender: STONE III, HOYT Weeks in Treatment: 54 Visit Information History Since Last Visit Added or deleted any medications: No Patient Arrived: Cane Any new allergies or adverse reactions: No Arrival Time: 11:07 Had a fall or experienced change in No Accompanied By: self activities of daily living that may affect Transfer Assistance: None risk of falls: Patient Identification Verified: Yes Signs or symptoms of abuse/neglect since last visito No Secondary Verification Process Yes Hospitalized since last visit: No Completed: Implantable device outside of the clinic excluding No Patient Requires Transmission-Based No cellular tissue based products placed in the center Precautions: since last visit: Patient Has Alerts: Yes Has Dressing in Place as Prescribed: Yes Patient Alerts: ABI AVVS 03/29/18 Pain Present Now: No L 1.05 R 1.03, TBI L .56, R .85 Electronic Signature(s) Signed: 07/19/2018 11:41:35 AM By: Secundino Ginger Entered By: Secundino Ginger on 07/19/2018 11:15:38 [place], [name] ([id number]) -------------------------------------------------------------------------------- Lower Extremity Assessment Details Patient Name: [name] Date of Service: 07/19/2018 11:00 AM Medical Record Number: [id number] Patient Account Number: [id number] Date of Birth/Sex: 1923/04/21 (84 y.o. F) Treating RN: Secundino Ginger Primary Care Tyjuan Demetro: Grayland Ormond Other Clinician: Referring Garielle Mroz: FITZGERALD, DAVID Treating Racquelle Hyser/Extender: STONE III, HOYT Weeks in Treatment: 49 Edema  Assessment Assessed: [Left: No] [Right: No] Edema: [Left: No] [Right: No] Calf Left: Right: Point of Measurement: 29 cm From Medial Instep 28 cm 28 cm Ankle Left: Right: Point of Measurement: 12 cm From Medial Instep 19 cm 19.5 cm Vascular Assessment Claudication: Claudication Assessment [Left:None] [Right:None] Pulses: Dorsalis Pedis Palpable: [Left:Yes] [Right:Yes] Posterior Tibial Extremity colors, hair growth, and conditions: Extremity Color: [Left:Hyperpigmented] [Right:Hyperpigmented] Hair Growth on Extremity: [Left:No] [Right:No] Temperature of Extremity: [Left:Cool] [Right:Cool] Capillary Refill: [Left:< 3 seconds] [Right:< 3 seconds] Toe Nail Assessment Left: Right: Thick: Yes Yes Discolored: Yes Yes Deformed: Yes Yes Improper Length and Hygiene: No No Electronic Signature(s) Signed: 07/19/2018 11:41:35 AM By: Secundino Ginger Entered By: Secundino Ginger on 07/19/2018 11:30:21 [name], [name] ([id number]) -------------------------------------------------------------------------------- Multi Wound Chart Details Patient Name: [name] Date of Service: 07/19/2018 11:00 AM Medical Record Number: [id number] Patient Account Number: [id number] Date of Birth/Sex: 05/21/1923 (84 y.o. 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 N/A Comorbid History: Cataracts, Congestive Heart  Cataracts, Congestive Heart N/A Failure, Hypertension, Failure, Hypertension, Osteoarthritis, Neuropathy Osteoarthritis, Neuropathy Date Acquired: 08/11/2015 04/12/2018 N/A Weeks of Treatment: 49 13 N/A Wound Status: Open Open N/A Measurements L x W x D 1.9x2.3x0.8 2.4x1.9x0.6 N/A (cm) Area (cm) : 3.432 3.581 N/A Volume (cm) : 2.746 2.149 N/A % Reduction in Area: -124.00% -356.20% N/A % Reduction in Volume: -498.30% -2620.30% N/A Classification: Category/Stage IV Category/Stage IV N/A Exudate Amount: Medium Large N/A Exudate Type: Purulent Purulent N/A Exudate Color: yellow, brown, green yellow, brown, green N/A Foul Odor After Cleansing: Yes  Yes N/A Odor Anticipated Due to No No N/A Product Use: Wound Margin: Distinct, outline attached Flat and Intact N/A Granulation Amount: Small (1-33%) Small (1-33%) N/A Granulation Quality: Pink Pink N/A Necrotic Amount: Large (67-100%) Large (67-100%) N/A Necrotic Tissue: Eschar, Adherent Slough Eschar, Adherent Slough N/A Exposed Structures: N/A [name] ([id number]) Fat Layer (Subcutaneous Fat Layer (Subcutaneous Tissue) Exposed: Yes Tissue) Exposed: Yes Fascia: No Fascia: No Tendon: No Tendon: No Muscle: No Muscle: No Joint: No Joint: No Bone: No Bone: No Epithelialization: None None N/A Periwound Skin Texture: Excoriation: No Excoriation: No N/A Induration: No Induration: No Callus: No Callus: No Crepitus: No Crepitus: No Rash: No Rash: No Scarring: No Scarring: No Periwound Skin Moisture: Maceration: Yes Maceration: Yes N/A Dry/Scaly: No Dry/Scaly: No Periwound Skin Color: Erythema: Yes Erythema: Yes N/A Rubor: Yes Atrophie Blanche: No Atrophie Blanche: No Cyanosis: No Cyanosis: No Ecchymosis: No Ecchymosis: No Hemosiderin Staining: No Hemosiderin Staining: No Mottled: No Mottled: No Pallor: No Pallor: No Rubor: No Erythema Location: Circumferential Circumferential N/A Temperature: No  Abnormality No Abnormality N/A Tenderness on Palpation: Yes No N/A Wound Preparation: Ulcer Cleansing: Ulcer Cleansing: N/A Rinsed/Irrigated with Saline Rinsed/Irrigated with Saline Topical Anesthetic Applied: Topical Anesthetic Applied: Other: lidocaine 4% Other: lidocaine 4% Treatment Notes Electronic Signature(s) Signed: 07/19/2018 5:05:24 PM By: Montey Hora Entered By: Montey Hora on 07/19/2018 11:56:11 [name], [name] ([id number]) -------------------------------------------------------------------------------- [place] Details Patient Name: [name] Date of Service: 07/19/2018 11:00 AM Medical Record Number: [id number] Patient Account Number: [id number] Date of Birth/Sex: 10/18/1923 (84 y.o. F) Treating RN: Montey Hora Primary Care Cabot Cromartie: Grayland Ormond Other Clinician: Referring Bambi Fehnel: FITZGERALD, DAVID Treating Careena Degraffenreid/Extender: STONE III, HOYT Weeks in Treatment: 15 Active Inactive ` Abuse / Safety / Falls / Self Care Management Nursing Diagnoses: Potential for falls Goals: Patient will not experience any injury related to falls Date Initiated: 08/10/2017 Target Resolution Date: 11/10/2017 Goal Status: Active Interventions: Assess Activities of Daily Living upon admission and as needed Assess fall risk on admission and as needed Assess: immobility, friction, shearing, incontinence upon admission and as needed Notes: ` Nutrition Nursing Diagnoses: Imbalanced nutrition Potential for alteratiion in Nutrition/Potential for imbalanced nutrition Goals: Patient/caregiver agrees to and verbalizes understanding of need to use nutritional supplements and/or vitamins as prescribed Date Initiated: 08/10/2017 Target Resolution Date: 12/08/2017 Goal Status: Active Interventions: Assess patient nutrition upon admission and as needed per policy Notes: ` Orientation to the Wound Care Program Nursing Diagnoses: Knowledge deficit  related to the wound healing center program Goals: Patient/caregiver will verbalize understanding of the [place] Program Date Initiated: 08/10/2017 Target Resolution Date: 09/08/2017 [name], [name] ([id number]) Goal Status: Active Interventions: Provide education on orientation to the wound center Notes: ` Pain, Acute or Chronic Nursing Diagnoses: Pain, acute or chronic: actual or potential Potential alteration in comfort, pain Goals: Patient/caregiver will verbalize adequate pain control between visits Date Initiated: 08/10/2017 Target Resolution Date: 12/08/2017 Goal Status: Active Interventions: Complete pain assessment as per visit requirements Notes: ` Wound/Skin Impairment Nursing Diagnoses: Impaired tissue integrity Knowledge deficit related to ulceration/compromised skin integrity Goals: Ulcer/skin breakdown will have a volume reduction of 80% by week 12 Date Initiated: 08/10/2017 Target Resolution Date: 12/01/2017 Goal Status: Active Interventions: Assess patient/caregiver ability to perform ulcer/skin care regimen upon admission and as needed Notes: Electronic Signature(s) Signed: 07/19/2018 5:05:24 PM By: Montey Hora Entered By: Montey Hora on 07/19/2018 11:56:00 [name], [name] ([id number]) -------------------------------------------------------------------------------- Pain Assessment Details Patient Name: [name] Date of Service: 07/19/2018 11:00 AM  Medical Record Number: [id number] Patient Account Number: [id number] Date of Birth/Sex: 1923/06/27 (84 y.o. F) Treating RN: Secundino Ginger Primary Care Bijon Mineer: Grayland Ormond Other Clinician: Referring Emelio Schneller: FITZGERALD, DAVID Treating Solyana Nonaka/Extender: STONE III, HOYT Weeks in Treatment: 28 Active Problems Location of Pain Severity and Description of Pain Patient Has Paino No Site Locations Pain Management and Medication Current Pain Management: Goals for Pain  Management pt C/O intermittent pain to ulcer site. Enc to see primary PRN. Electronic Signature(s) Signed: 07/19/2018 11:41:35 AM By: Secundino Ginger Entered By: Secundino Ginger on 07/19/2018 11:16:52 [name], [name] ([id number]) -------------------------------------------------------------------------------- Wound Assessment Details Patient Name: [name] Date of Service: 07/19/2018 11:00 AM Medical Record Number: [id number] Patient Account Number: [id number] Date of Birth/Sex: 1923-04-17 (84 y.o. F) Treating RN: Secundino Ginger Primary Care Jaceon Heiberger: Grayland Ormond Other Clinician: Referring Jiali Linney: FITZGERALD, DAVID Treating Kenadee Gates/Extender: STONE III, HOYT Weeks in Treatment: 49 Wound Status Wound Number: 1 Primary Pressure Ulcer Etiology: Wound Location: Right Malleolus - Lateral Secondary Arterial Insufficiency Ulcer Wounding Event: Gradually Appeared Etiology: Date Acquired: 08/11/2015 Wound Status: Open Weeks Of Treatment: 49 Comorbid Cataracts, Congestive Heart Failure, Clustered Wound: No History: Hypertension, Osteoarthritis, Neuropathy Photos Photo Uploaded By: Secundino Ginger on 07/19/2018 11:36:30 Wound Measurements Length: (cm) 1.9 % Reductio Width: (cm) 2.3 % Reductio Depth: (cm) 0.8 Epithelial Area: (cm) 3.432 Volume: (cm) 2.746 n in Area: -124% n in Volume: -498.3% ization: None Wound Description Classification: Category/Stage IV Foul Odor Wound Margin: Distinct, outline attached Due to Pro Exudate Amount: Medium Slough/Fib Exudate Type: Purulent Exudate Color: yellow, brown, green After Cleansing: Yes duct Use: No rino Yes Wound Bed Granulation Amount: Small (1-33%) Exposed Structure Granulation Quality: Pink Fascia Exposed: No Necrotic Amount: Large (67-100%) Fat Layer (Subcutaneous Tissue) Exposed: Yes Necrotic Quality: Eschar, Adherent Slough Tendon Exposed: No Muscle Exposed: No Joint Exposed: No Bone Exposed: No Periwound Skin  Texture [name], [name] ([id number]) Texture Color No Abnormalities Noted: No No Abnormalities Noted: No Callus: No Atrophie Blanche: No Crepitus: No Cyanosis: No Excoriation: No Ecchymosis: No Induration: No Erythema: Yes Rash: No Erythema Location: Circumferential Scarring: No Hemosiderin Staining: No Mottled: No Moisture Pallor: No No Abnormalities Noted: No Rubor: Yes Dry / Scaly: No Maceration: Yes Temperature / Pain Temperature: No Abnormality Tenderness on Palpation: Yes Wound Preparation Ulcer Cleansing: Rinsed/Irrigated with Saline Topical Anesthetic Applied: Other: lidocaine 4%, Electronic Signature(s) Signed: 07/19/2018 11:41:35 AM By: Secundino Ginger Entered By: Secundino Ginger on 07/19/2018 11:25:39 [name], [name] ([id number]) -------------------------------------------------------------------------------- Wound Assessment Details Patient Name: [name] Date of Service: 07/19/2018 11:00 AM Medical Record Number: [id number] Patient Account Number: [id number] Date of Birth/Sex: 1923-11-22 (84 y.o. F) Treating RN: Secundino Ginger Primary Care Aeralyn Barna: Grayland Ormond Other Clinician: Referring Dalyn Kjos: FITZGERALD, DAVID Treating Azriel Jakob/Extender: STONE III, HOYT Weeks in Treatment: 49 Wound Status Wound Number: 2 Primary Pressure Ulcer Etiology: Wound Location: Left Malleolus - Lateral Secondary Arterial Insufficiency Ulcer Wounding Event: Gradually Appeared Etiology: Date Acquired: 04/12/2018 Wound Status: Open Weeks Of Treatment: 13 Comorbid Cataracts, Congestive Heart Failure, Clustered Wound: No History: Hypertension, Osteoarthritis, Neuropathy Photos Photo Uploaded By: Secundino Ginger on 07/19/2018 11:36:31 Wound Measurements Length: (cm) 2.4 % Reduction Width: (cm) 1.9 % Reduction Depth: (cm) 0.6 Epitheliali Area: (cm) 3.581 Tunneling: Volume: (cm) 2.149 Underminin in Area: -356.2% in Volume: -2620.3% zation: None No g: No Wound  Description Classification: Category/Stage IV Foul Odor A Wound Margin: Flat and Intact Due to Prod Exudate Amount: Large Slough/Fibr Exudate Type: Purulent Exudate Color: yellow, brown, green fter Cleansing: Yes uct Use: No  ino Yes Wound Bed Granulation Amount: Small (1-33%) Exposed Structure Granulation Quality: Pink Fascia Exposed: No Necrotic Amount: Large (67-100%) Fat Layer (Subcutaneous Tissue) Exposed: Yes Necrotic Quality: Eschar, Adherent Slough Tendon Exposed: No Muscle Exposed: No Joint Exposed: No Bone Exposed: No Periwound Skin Texture [name], [name] ([id number]) Texture Color No Abnormalities Noted: No No Abnormalities Noted: No Callus: No Atrophie Blanche: No Crepitus: No Cyanosis: No Excoriation: No Ecchymosis: No Induration: No Erythema: Yes Rash: No Erythema Location: Circumferential Scarring: No Hemosiderin Staining: No Mottled: No Moisture Pallor: No No Abnormalities Noted: No Rubor: No Dry / Scaly: No Maceration: Yes Temperature / Pain Temperature: No Abnormality Wound Preparation Ulcer Cleansing: Rinsed/Irrigated with Saline Topical Anesthetic Applied: Other: lidocaine 4%, Electronic Signature(s) Signed: 07/19/2018 11:41:35 AM By: Secundino Ginger Entered By: Secundino Ginger on 07/19/2018 11:27:56 [name], [name] ([id number]) -------------------------------------------------------------------------------- Vitals Details Patient Name: [name] Date of Service: 07/19/2018 11:00 AM Medical Record Number: [id number] Patient Account Number: [id number] Date of Birth/Sex: 08/10/1923 (84 y.o. F) Treating RN: Secundino Ginger Primary Care Lovis More: Grayland Ormond Other Clinician: Referring Amyra Vantuyl: FITZGERALD, DAVID Treating Raoul Ciano/Extender: STONE III, HOYT Weeks in Treatment: 49 Vital Signs Time Taken: 11:16 Temperature (F): 98.1 Height (in): 65 Pulse (bpm): 83 Weight (lbs): 154.3 Respiratory Rate (breaths/min): 18 Body Mass Index  (BMI): 25.7 Blood Pressure (mmHg): 119/55 Reference Range: 80 - 120 mg / dl Electronic Signature(s) Signed: 07/19/2018 11:41:35 AM By: Secundino Ginger Entered By: Secundino Ginger on 07/19/2018 11:17:17

## 2018-08-01 NOTE — Progress Notes (Signed)
[name], [name] ([id number]) Visit Report for 07/19/2018 Chief Complaint Document Details Patient Name: [name], [name]. Date of Service: 07/19/2018 11:00 AM Medical Record Number: [id number] Patient Account Number: [id number] Date of Birth/Sex: November 11, 1923 (84 y.o. F) Treating RN: Montey Hora Primary Care Provider: Grayland Ormond Other Clinician: Referring Provider: FITZGERALD, DAVID Treating Provider/Extender: STONE III, Kelon Easom Weeks in Treatment: 32 Information Obtained from: Patient Chief Complaint Patient is here for right lateral malleolus and left lateral malleolus ulcer Electronic Signature(s) Signed: 07/22/2018 8:05:21 AM By: Worthy Keeler PA-C Entered By: Worthy Keeler on 07/19/2018 11:16:45 [name], [name] ([id number]) -------------------------------------------------------------------------------- Debridement Details Patient Name: [name] Date of Service: 07/19/2018 11:00 AM Medical Record Number: [id number] Patient Account Number: [id number] Date of Birth/Sex: 10/24/1923 (84 y.o. F) Treating RN: Montey Hora Primary Care Provider: Grayland Ormond Other Clinician: Referring Provider: FITZGERALD, DAVID Treating Provider/Extender: STONE III, Maurice Ramseur Weeks in Treatment: 49 Debridement Performed for Wound #2 Left,Lateral Malleolus Assessment: Performed By: Physician STONE III, Aleja Yearwood E., PA-C Debridement Type: Debridement Severity of Tissue Pre Fat layer exposed Debridement: Pre-procedure Verification/Time Yes - 11:57 Out Taken: Start Time: 11:57 Pain Control: Lidocaine 4% Topical Solution Total Area Debrided (L x W): 2.4 (cm) x 1.9 (cm) = 4.56 (cm) Tissue and other material Viable, Non-Viable, Slough, Subcutaneous, Slough debrided: Level: Skin/Subcutaneous Tissue Debridement Description: Excisional Instrument: Curette Bleeding: Minimum Hemostasis Achieved: Pressure End Time: 11:59 Procedural Pain: 0 Post Procedural Pain: 0 Response to  Treatment: Procedure was tolerated well Level of Consciousness: Awake and Alert Post Debridement Measurements of Total Wound Length: (cm) 2.4 Stage: Category/Stage IV Width: (cm) 1.9 Depth: (cm) 0.6 Volume: (cm) 2.149 Character of Wound/Ulcer Post Improved Debridement: Severity of Tissue Post Debridement: Fat layer exposed Post Procedure Diagnosis Same as Pre-procedure Electronic Signature(s) Signed: 07/19/2018 5:05:24 PM By: Montey Hora Signed: 07/22/2018 8:05:21 AM By: Worthy Keeler PA-C Entered By: Montey Hora on 07/19/2018 11:59:40 [name], [name] ([id number]) -------------------------------------------------------------------------------- Debridement Details Patient Name: [name] Date of Service: 07/19/2018 11:00 AM Medical Record Number: [id number] Patient Account Number: [id number] Date of Birth/Sex: 01-01-1923 (84 y.o. F) Treating RN: Montey Hora Primary Care Provider: Grayland Ormond Other Clinician: Referring Provider: FITZGERALD, DAVID Treating Provider/Extender: STONE III, Toryn Mcclinton Weeks in Treatment: 49 Debridement Performed for Wound #1 Right,Lateral Malleolus Assessment: Performed By: Physician STONE III, Chrystina Naff E., PA-C Debridement Type: Debridement Severity of Tissue Pre Fat layer exposed Debridement: Pre-procedure Verification/Time Yes - 11:59 Out Taken: Start Time: 11:59 Pain Control: Lidocaine 4% Topical Solution Total Area Debrided (L x W): 1.9 (cm) x 2.3 (cm) = 4.37 (cm) Tissue and other material Viable, Non-Viable, Slough, Subcutaneous, Slough debrided: Level: Skin/Subcutaneous Tissue Debridement Description: Excisional Instrument: Curette Bleeding: Minimum Hemostasis Achieved: Pressure End Time: 12:01 Procedural Pain: 0 Post Procedural Pain: 0 Response to Treatment: Procedure was tolerated well Level of Consciousness: Awake and Alert Post Debridement Measurements of Total Wound Length: (cm) 1.9 Stage: Category/Stage  IV Width: (cm) 2.3 Depth: (cm) 0.9 Volume: (cm) 3.089 Character of Wound/Ulcer Post Improved Debridement: Severity of Tissue Post Debridement: Fat layer exposed Post Procedure Diagnosis Same as Pre-procedure Electronic Signature(s) Signed: 07/19/2018 5:05:24 PM By: Montey Hora Signed: 07/22/2018 8:05:21 AM By: Worthy Keeler PA-C Entered By: Montey Hora on 07/19/2018 12:02:02 [name] ([id number]) -------------------------------------------------------------------------------- HPI Details Patient Name: [name] Date of Service: 07/19/2018 11:00 AM Medical Record Number: [id number] Patient Account Number: [id number] Date of Birth/Sex: [birth year] (84 y.o. 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 Dr. Amalia Hailey the podiatrist saw her on 07/20/2017 and sent her to the wound center for possible hyperbaric oxygen therapy. past medical history of peripheral vascular disease, varicose veins, status post appendectomy, basal cell carcinoma excision from the left leg, cholecystectomy, pacemaker placement, right lower extremity angiography done by Dr. dew in March 2017 with placement of a stent. there is also note of a successful ablation of the right small saphenous vein done which was reviewed by ultrasound on 10/24/2016. the patient had a right small saphenous vein ablation done on 10/20/2016. The patient has never been a smoker. She has been seen by Dr. Corene Cornea dew the vascular surgeon who most recently saw her on 06/15/2017 for evaluation of ongoing problems with right leg swelling.  She had a lower extremity arterial duplex examination done(02/13/17) which showed patent distal right superficial femoral artery stent and above-the-knee popliteal stent without evidence of restenosis. The ABI was more than 1.3 on the right and more than 1.3 on the left. This was consistent with noncompressible arteries due to medial calcification. The right great toe pressure and PPG waveforms are within normal limits and the left great toe pressure and PPG waveforms are decreased. he recommended she continue to wear her compression stockings and continue with elevation. She is scheduled to have a noninvasive arterial study in the near future 08/16/2017 -- had a lower extremity arterial duplex examination done which showed patent distal right superficial femoral artery stent and above-the-knee popliteal stent without evidence of restenosis. The ABI was more than 1.3 on the right and more than 1.3 on the left. This was consistent with noncompressible arteries due to medial calcification. The right great toe pressure and PPG waveforms are within normal limits and the left great toe pressure and PPG waveforms are decreased. the x-ray of the right ankle has not yet been done 08/24/2017 -- had a right ankle x-ray -- IMPRESSION:1. No fracture, bone lesion or evidence of osteomyelitis. 2. Lateral soft tissue swelling with a soft tissue ulcer. she has not yet seen the vascular surgeon for review 08/31/17 on evaluation today patient's wound appears to be showing signs of improvement. She still with her appointment with vascular in order to review her results of her vascular study and then determine if any intervention would be recommended at that time. No fevers, chills, nausea, or vomiting noted at this time. She has been tolerating the dressing changes without complication. 09/28/17 on evaluation today patient's wound appears to show signs of good improvement in regard to the granulation tissue which is  surfacing. There is still a layer of slough covering the wound and the posterior portion is still significantly deeper than the anterior nonetheless there has been some good sign of things moving towards the better. She is going to go back to Dr. dew for reevaluation to ensure her blood flow is still appropriate. That will be before her next evaluation with [place] next week. No fevers, chills, nausea, or vomiting noted at this time. Patient does have some discomfort rated to be a 3-4/10 depending on activity specifically cleansing the wound makes it worse. 10/05/2017 -- the patient was seen by Dr. Lucky Cowboy last week and noninvasive studies showed a normal right ABI with brisk triphasic waveforms  consistent with no arterial insufficiency including normal digital pressures. The duplex showed a patent distal right SFA stent and the proximal SFA was also normal. He was pleased with her test and thought she should have enough of perfusion for normal wound healing. He would see her back in 6 months time. 12/21/17 on evaluation today patient appears to be doing fairly well in regard to her right lateral ankle wound. Unfortunately the main issue that she is expansion at this point is that she is having some issues with what appears to be some cellulitis in the [name], [name]. ([id number]) right anterior shin. She has also been noting a little bit of uncomfortable feeling especially last night and her ankle area. I'm afraid that she made the developing a little bit of an infection. With that being said I think it is in the early stages. 12/28/17 on evaluation today patient's ankle appears to be doing excellent. She's making good progress at this point the cellulitis seems to have improved after last week's evaluation. Overall she is having no significant discomfort which is excellent news. She does have an appointment with Dr. dew on March 29, 2018 for reevaluation in regard to the stent he placed. She seems to have  excellent blood flow in the right lower extremity. 01/19/12 on evaluation today patient's wound appears to be doing very well. In fact she does not appear to require debridement at this point, there's no evidence of infection, and overall from the standpoint of the wound she seems to be doing very well. With that being said I believe that it may be time to switch to different dressing away from the [facility] Dressing she tells me she does have a lot going on her friend actually passed away yesterday and she's also having a lot of issues with her husband this obviously is weighing heavy on her as far as your thoughts and concerns today. 01/25/18 on evaluation today patient appears to be doing fairly well in regard to her right lateral malleolus. She has been tolerating the dressing changes without complication. Overall I feel like this is definitely showing signs of improvement as far as how the overall appearance of the wound is there's also evidence of epithelium start to migrate over the granulation tissue. In general I think that she is progressing nicely as far as the wound is concerned. The only concern she really has is whether or not we can switch to every other week visits in order to avoid having as many appointments as her daughters have a difficult time getting her to her appointments as well as the patient's husband to his he is not doing very well at this point. 02/22/18 on evaluation today patient's right lateral malleolus ulcer appears to be doing great. She has been tolerating the dressing changes without complication. Overall you making excellent progress at this time. Patient is having no significant discomfort. 03/15/18 on evaluation today patient appears to be doing much more poorly in regard to her right lateral ankle ulcer at this point. Unfortunately since have last seen her her husband has passed just a few days ago is obviously weighed heavily on her her daughter also had  surgery well she is with her today as usual. There does not appear to be any evidence of infection she does seem to have significant contusion/deep tissue injury to the right lateral malleolus which was not noted previous when I saw her last. It's hard to tell of exactly when this injury occurred  although during the time she was spending the night in the hospital this may have been most likely. 03/22/18 on evaluation today patient appears to actually be doing very well in regard to her ulcer. She did unfortunately have a setback which was noted last week however the good news is we seem to be getting back on track and in fact the wound in the core did still have some necrotic tissue which will be addressed at this point today but in general I'm seeing signs that things are on the up and up. She is glad to hear this obviously she's been somewhat concerned that due to the how her wound digressed more recently. 03/29/18 on evaluation today patient appears to be doing fairly well in regard to her right lower extremity lateral malleolus ulcer. She unfortunately does have a new area of pressure injury over the inferior portion where the wound has opened up a little bit larger secondary to the pressure she seems to be getting. She does tell me sometimes when she sleeps at night that it actually hurts and does seem to be pushing on the area little bit more unfortunately. There does not appear to be any evidence of infection which is good news. She has been tolerating the dressing changes without complication. She also did have some bruising in the left second and third toes due to the fact that she may have bump this or injured it although she has neuropathy so she does not feel she did move recently that may have been where this came from. Nonetheless there does not appear to be any evidence of infection at this time. 04/12/18 on evaluation today patient's wound on the right lateral ankle actually appears to  be doing a little bit better with a lot of necrotic docking tissue centrally loosening up in clearing away. However she does have the beginnings of a deep tissue injury on the left lateral malleolus likely due to the fact we've been trying offload the right as much as we have. I think she may benefit from an assistive soft device to help with offloading and it looks like they're looking at one of the doughnut conditions that wraps around the lower leg to offload which I think will definitely do a good job. With that being said I think we definitely need to address this issue on the left before it becomes a wound. Patient is not having significant pain. 04/19/18 on evaluation today patient appears to be doing excellent in regard to the progress she's made with her right lateral ankle ulcer. The left ankle region which did show evidence of a deep tissue injury seems to be resolving there's little fluid noted underneath and a blister there's nothing open at this point in time overall I feel like this is progressing nicely which is good news. She does not seem to be having significant discomfort at this point which is also good news. 04/25/18-She is here in follow up evaluation for bilateral lateral malleolar ulcers. The right lateral malleolus ulcer with pale subcutaneous tissue exposure, central area of ulcer with tendon/periosteum exposed. The left lateral malleolus ulcer now with Remmel, [name] ([id number]) central area of nonviable tissue, otherwise deep tissue injury. She is wearing compression wraps to the left lower extremity, she will place the right lower extremity compression wraps on when she gets home. She will be out of town over the weekend and return next week and follow-up appointment. She completed her doxycycline this morning 05/03/18 on evaluation  today patient appears to be doing very well in regard to her right lateral ankle ulcer in general. At least she's showing some signs of  improvement in this regard. Unfortunately she has some additional injury to the left lateral malleolus region which appears to be new likely even over the past several days. Again this determination is based on the overall appearance. With that being said the patient is obviously frustrated about this currently. 05/10/18-She is here in follow-up evaluation for bilateral lateral malleolar ulcers. She states she has purchased offloading shoes/boots and they will arrive tomorrow. She was asked to bring them in the office at next week's appointment so her provider is aware of product being utilized. She continues to sleep on right or left side, she has been encouraged to sleep on her back. The right lateral malleolus ulcer is precariously close to peri-osteum; will order xray. The left lateral malleolus ulcer is improved. Will switch back to santyl; she will follow up next week. 05/17/18 on evaluation today patient actually appears to be doing very well in regard to her malleolus her ulcers compared to last time I saw them. She does not seem to have as much in the way of contusion at this point which is great news. With that being said she does continue to have discomfort and I do believe that she is still continuing to benefit from the offloading/pressure reducing boots that were recommended. I think this is the key to trying to get this to heal up completely. 05/24/18 on evaluation today patient actually appears to be doing worse at this point in time unfortunately compared to her last week's evaluation. She is having really no increased pain which is good news unfortunately she does have more maceration in your theme and noted surrounding the right lateral ankle the left lateral ankle is not really is erythematous I do not see signs of the overt cellulitis on that side. Unfortunately the wounds do not seem to have shown any signs of improvement since the last evaluation. She also has significant swelling  especially on the right compared to previous some of this may be due to infection however also think that she may be served better while she has these wounds by compression wrapping versus continuing to use the Juxta-Lite for the time being. Especially with the amount of drainage that she is experiencing at this point. No fevers, chills, nausea, or vomiting noted at this time. 05/31/18 on evaluation today patient appears to actually be doing better in regard to her right lateral lower extremity ulcer specifically on the malleolus region. She has been tolerating the antibiotic without complication. With that being said she still continues to have issues but a little bit of redness although nothing like she what she was experiencing previous. She still continues to pressure to her ankle area she did get the problem on offloading boots unfortunately she will not wear them she states there too uncomfortable and she can't get in and out of the bed. Nonetheless at this point her wounds seem to be continually getting worse which is not what we want I'm getting somewhat concerned about her progress and how things are going to proceed if we do not intervene in some way shape or form. I therefore had a very lengthy conversation today about offloading yet again and even made a specific suggestion for switching her to a memory foam mattress and even gave the information for a specific one that they could look at getting if it  was something that they were interested in considering. She does not want to be considered for a hospital bed air mattress although honestly insurance would not cover it that she does not have any wounds on her trunk. 06/14/18 on evaluation today both wounds over the bilateral lateral malleolus her ulcers appear to be doing better there's no evidence of pressure injury at this point. She did get the foam mattress for her bed and this does seem to have been extremely beneficial for her in my  pinion. Her daughter states that she is having difficulty getting out of bed because of how soft it is. The patient also relates this to be. Nonetheless I do feel like she's actually doing better. Unfortunately right after and around the time she was getting the mattress she also sustained a fall when she got up to go pick up the phone and ended up injuring her right elbow she has 18 sutures in place. We are not caring for this currently although home health is going to be taking the sutures out shortly. Nonetheless this may be something that we need to evaluate going forward. It depends on how well it has or has not healed in the end. She also recently saw an orthopedic specialist for an injection in the right shoulder just before her fall unfortunately the fall seems to have worsened her pain. 06/21/18 on evaluation today patient appears to be doing about the same in regard to her lateral malleolus ulcers. Both appear to be just a little bit deeper but again we are clinging away the necrotic and dead tissue which I think is why this is progressing towards a deeper realm as opposed to improving from my measurement standpoint in that regard. Nonetheless she has been tolerating the dressing changes she absolutely hates the memory foam mattress topper that was obtained for her nonetheless I do believe this is still doing excellent as far as taking care of excess pressure in regard to the lateral malleolus regions. She in fact has no pressure injury that I see whereas in weeks past it was week by week I was constantly seeing new pressure injuries. Overall I think it has been very beneficial for her. 07/03/18; patient arrives in my clinic today. She has deep punched out areas over her bilateral lateral malleoli. The area on the right has some more depth. [name], [name] ([id number]) We spent a lot of time today talking about pressure relief for these areas. This started when her daughter asked for  a prescription for a memory foam mattress. I have never written a prescription for a mattress and I don't think insurances would pay for that on an ordinary bed. In any case he came up that she has foam boots that she refuses to wear. I would suggest going to these before any other offloading issues when she is in bed. They say she is meticulous about offloading this the rest of the day 07/10/18- She is seen in follow-up evaluation for bilateral, lateral malleolus ulcers. There is no improvement in the ulcers. She has purchased and is sleeping on a memory foam mattress/overlay, she has been using the offloading boots nightly over the past week. She has a follow up appointment with vascular medicine at the end of October, in my opinion this follow up should be expedited given her deterioration and suboptimal TBI results. We will order plain film xray of the left ankle as deeper structures are palpable; would consider having MRI, regardless of xray report(s).  The ulcers will be treated with iodoflex/iodosorb, she is unable to safely change the dressings daily with santyl. 07/19/18 on evaluation today patient appears to be doing in general visually well in regard to her bilateral lateral malleolus ulcers. She has been tolerating the dressing changes without complication which is good news. With that being said we did have an x-ray performed on 07/12/18 which revealed a slight loosen see in the lateral portion of the distal left fibula which may represent artifact but underline lytic destruction or osteomyelitis could not be excluded. MRI was recommended. With that being said we can see about getting the patient scheduled for an MRI to further evaluate this area. In fact we have that scheduled currently for August 20 19,019. Electronic Signature(s) Signed: 07/22/2018 8:05:21 AM By: Worthy Keeler PA-C Entered By: Worthy Keeler on 07/19/2018 13:11:53 [name], [name]  ([id number]) -------------------------------------------------------------------------------- Physical Exam Details Patient Name: [name] Date of Service: 07/19/2018 11:00 AM Medical Record Number: [id number] Patient Account Number: [id number] Date of Birth/Sex: 03/23/1923 (84 y.o. F) Treating RN: Montey Hora Primary Care Provider: Grayland Ormond Other Clinician: Referring Provider: FITZGERALD, DAVID Treating Provider/Extender: STONE III, Harsimran Westman Weeks in Treatment: 58 Constitutional Well-nourished and well-hydrated in no acute distress. Respiratory normal breathing without difficulty. Psychiatric this patient is able to make decisions and demonstrates good insight into disease process. Alert and Oriented x 3. pleasant and cooperative. Notes At this point patient's wound bed shows evidence of good granulation especially compared to when I last evaluated her wound. With that being said the Juxta-Lite compression wrap seem to be doing very well for her as far as controlling the fluid and in general I think she is showing signs of excellent improvement. With that being said we do need to do them ride the left ankle just to ensure there's nothing going on at this point from the standpoint of osteomyelitis in my pinion. Sharp debridement was performed to both wounds to remove slough today she tolerated this without complication. Electronic Signature(s) Signed: 07/22/2018 8:05:21 AM By: Worthy Keeler PA-C Entered By: Worthy Keeler on 07/19/2018 13:12:44 [name], [name] ([id number]) -------------------------------------------------------------------------------- Physician Orders Details Patient Name: [name] Date of Service: 07/19/2018 11:00 AM Medical Record Number: [id number] Patient Account Number: [id number] Date of Birth/Sex: 03-30-1923 (84 y.o. F) Treating RN: Montey Hora Primary Care Provider: Grayland Ormond Other Clinician: Referring Provider:  FITZGERALD, DAVID Treating Provider/Extender: STONE III, Cayce Quezada Weeks in Treatment: 74 Verbal / Phone Orders: No Diagnosis Coding ICD-10 Coding Code Description 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, stage 4 L89.524 Pressure ulcer of left ankle, stage 4 I70.233 Atherosclerosis of native arteries of right leg with ulceration of ankle I70.243 Atherosclerosis of native arteries of left leg with ulceration of ankle I89.0 Lymphedema, not elsewhere classified B35.4 Tinea corporis Wound Cleansing Wound #1 Right,Lateral Malleolus o Clean wound with Normal Saline. o Cleanse wound with mild soap and water o May Shower, gently pat wound dry prior to applying new dressing. Wound #2 Left,Lateral Malleolus o Clean wound with Normal Saline. o Cleanse wound with mild soap and water o May Shower, gently pat wound dry prior to applying new dressing. Anesthetic (add to Medication List) Wound #1 Right,Lateral Malleolus o Topical Lidocaine 4% cream applied to wound bed prior to debridement (In Clinic Only). Wound #2 Left,Lateral Malleolus o Topical Lidocaine 4% cream applied to wound bed prior to debridement (In Clinic Only). Primary Wound Dressing Wound #1 Right,Lateral Malleolus o Iodoflex Wound #2 Left,Lateral Malleolus o  Iodoflex Secondary Dressing Wound #1 Right,Lateral Malleolus o Boardered Foam Dressing Wound #2 Left,Lateral Malleolus o Boardered Foam Dressing SKYLLAR, NOTARIANNI ([id number]) Dressing Change Frequency Wound #1 Right,Lateral Malleolus o Change Dressing Monday, Wednesday, Friday Wound #2 Left,Lateral Malleolus o Change Dressing Monday, Wednesday, Friday Follow-up Appointments Wound #1 Right,Lateral Malleolus o Return Appointment in 1 week. Wound #2 Left,Lateral Malleolus o Return Appointment in 1 week. Edema Control Wound #1 Right,Lateral Malleolus o Patient to wear own Velcro compression garment. o Elevate legs to the level of the  heart and pump ankles as often as possible Wound #2 Left,Lateral Malleolus o Patient to wear own Velcro compression garment. o Elevate legs to the level of the heart and pump ankles as often as possible Off-Loading Wound #1 Right,Lateral Malleolus o Mattress o Turn and reposition every 2 hours o Other: - do not put pressure on your right ankle Wound #2 Left,Lateral Malleolus o Mattress o Turn and reposition every 2 hours o Other: - do not put pressure on your right ankle Additional Orders / Instructions Wound #1 Right,Lateral Malleolus o Increase protein intake. Wound #2 Left,Lateral Malleolus o Increase protein intake. Home Health Wound #1 [place] Visits - [place] Nurse may visit PRN to address patientos wound care needs. o FACE TO FACE ENCOUNTER: MEDICARE and MEDICAID PATIENTS: I certify that this patient is under my care and that I had a face-to-face encounter that meets the physician face-to-face encounter requirements with this patient on this date. The encounter with the patient was in whole or in part for the following MEDICAL CONDITION: (primary reason for [place]) MEDICAL NECESSITY: I certify, that based on my findings, NURSING services are a medically necessary home health service. HOME BOUND STATUS: I certify that my clinical findings support that this patient is homebound (i.e., Due to illness or injury, pt requires aid of supportive devices such as crutches, cane, wheelchairs, walkers, the use of special transportation or the assistance of another person to leave their place of residence. There is a normal inability to leave the home and doing so requires considerable and taxing effort. Other absences are for medical reasons / religious services and are infrequent or of short duration when for other reasons). PERL, FOLMAR ([id number]) o If current dressing causes regression in  wound condition, may D/C ordered dressing product/s and apply Normal Saline Moist Dressing daily until next [place] / Other MD appointment. [place] of regression in wound condition at [phone]. o Please direct any NON-WOUND related issues/requests for orders to patient's Primary Care Physician Wound #2 [place] Visits - [place] Nurse may visit PRN to address patientos wound care needs. o FACE TO FACE ENCOUNTER: MEDICARE and MEDICAID PATIENTS: I certify that this patient is under my care and that I had a face-to-face encounter that meets the physician face-to-face encounter requirements with this patient on this date. The encounter with the patient was in whole or in part for the following MEDICAL CONDITION: (primary reason for [place]) MEDICAL NECESSITY: I certify, that based on my findings, NURSING services are a medically necessary home health service. HOME BOUND STATUS: I certify that my clinical findings support that this patient is homebound (i.e., Due to illness or injury, pt requires aid of supportive devices such as crutches, cane, wheelchairs, walkers, the use of special transportation or the assistance of another person to leave their place of residence. There is a  normal inability to leave the home and doing so requires considerable and taxing effort. Other absences are for medical reasons / religious services and are infrequent or of short duration when for other reasons). o If current dressing causes regression in wound condition, may D/C ordered dressing product/s and apply Normal Saline Moist Dressing daily until next [place] / Other MD appointment. [place] of regression in wound condition at [phone]. o Please direct any NON-WOUND related issues/requests for orders to patient's Primary Care Physician Radiology o MRI, lower extremity  without contrast - left ankle Electronic Signature(s) Signed: 07/19/2018 5:05:24 PM By: Montey Hora Signed: 07/22/2018 8:05:21 AM By: Worthy Keeler PA-C Entered By: Montey Hora on 07/19/2018 12:03:00 [place], [name] ([id number]) -------------------------------------------------------------------------------- Problem List Details Patient Name: [name] Date of Service: 07/19/2018 11:00 AM Medical Record Number: [id number] Patient Account Number: [id number] Date of Birth/Sex: 1923/09/01 (84 y.o. F) Treating RN: Montey Hora Primary Care Provider: Grayland Ormond Other Clinician: Referring Provider: FITZGERALD, DAVID Treating Provider/Extender: STONE III, [place] Persons Weeks in Treatment: 17 Active Problems ICD-10 Evaluated Encounter Code Description Active Date Today Diagnosis 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, stage 4 05/10/2018 No Yes L89.524 Pressure ulcer of left ankle, stage 4 04/25/2018 No Yes I70.233 Atherosclerosis of native arteries of right leg with ulceration of 08/10/2017 No Yes ankle I70.243 Atherosclerosis of native arteries of left leg with ulceration of 07/10/2018 No Yes ankle I89.0 Lymphedema, not elsewhere classified 08/10/2017 No Yes B35.4 Tinea corporis 09/28/2017 No Yes Inactive Problems Resolved Problems Electronic Signature(s) Signed: 07/22/2018 8:05:21 AM By: Worthy Keeler PA-C Entered By: Worthy Keeler on 07/19/2018 11:15:24 [place], [name] ([id number]) -------------------------------------------------------------------------------- Progress Note Details Patient Name: [name] Date of Service: 07/19/2018 11:00 AM Medical Record Number: [id number] Patient Account Number: [id number] Date of Birth/Sex: 1923-03-30 (84 y.o. 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 Dr. Amalia Hailey the podiatrist saw her on 07/20/2017 and sent her to the wound center for possible hyperbaric oxygen therapy. past medical history of peripheral vascular disease, varicose veins, status post appendectomy, basal cell carcinoma excision from the left leg, cholecystectomy, pacemaker placement, right lower extremity angiography done by Dr. dew in March 2017 with placement of a stent. there is also note of a successful ablation of the right small saphenous vein done which was reviewed by ultrasound on 10/24/2016. the patient had a right small saphenous vein ablation done on 10/20/2016. The patient has never been a smoker. She has been seen by Dr. Corene Cornea dew the vascular surgeon who most recently saw her on 06/15/2017 for evaluation of ongoing problems with right leg swelling. She had a lower extremity arterial duplex examination done(02/13/17) which showed patent distal right superficial femoral artery stent and above-the-knee popliteal stent without evidence of restenosis. The ABI was more than 1.3 on the right and more than 1.3 on the left. This was consistent with noncompressible arteries due to medial calcification. The right great toe pressure and PPG waveforms are within normal limits and the left great toe pressure and PPG waveforms are decreased. he recommended she  continue to wear her compression stockings and continue with elevation. She is scheduled to have a noninvasive arterial study in the near future 08/16/2017 -- had a lower extremity arterial duplex examination done which showed patent distal right superficial femoral artery stent  and above-the-knee popliteal stent without evidence of restenosis. The ABI was more than 1.3 on the right and more than 1.3 on the left. This was consistent with noncompressible arteries due to medial calcification. The right great toe pressure and PPG waveforms are within normal limits and the left great toe pressure and PPG waveforms are decreased. the x-ray of the right ankle has not yet been done 08/24/2017 -- had a right ankle x-ray -- IMPRESSION:1. No fracture, bone lesion or evidence of osteomyelitis. 2. Lateral soft tissue swelling with a soft tissue ulcer. she has not yet seen the vascular surgeon for review 08/31/17 on evaluation today patient's wound appears to be showing signs of improvement. She still with her appointment with vascular in order to review her results of her vascular study and then determine if any intervention would be recommended at that time. No fevers, chills, nausea, or vomiting noted at this time. She has been tolerating the dressing changes without complication. 09/28/17 on evaluation today patient's wound appears to show signs of good improvement in regard to the granulation tissue which is surfacing. There is still a layer of slough covering the wound and the posterior portion is still significantly deeper than the anterior nonetheless there has been some good sign of things moving towards the better. She is going to go back to Dr. dew for reevaluation to ensure her blood flow is still appropriate. That will be before her next evaluation with [place] next week. No fevers, chills, nausea, or vomiting noted at this time. Patient does have some discomfort rated to be a 3-4/10 depending on activity specifically cleansing the wound makes it worse. 10/05/2017 -- the patient was seen by Dr. Lucky Cowboy last week and noninvasive studies showed a normal right ABI with brisk [name], [name]. ([id number]) triphasic waveforms consistent with no arterial insufficiency including normal  digital pressures. The duplex showed a patent distal right SFA stent and the proximal SFA was also normal. He was pleased with her test and thought she should have enough of perfusion for normal wound healing. He would see her back in 6 months time. 12/21/17 on evaluation today patient appears to be doing fairly well in regard to her right lateral ankle wound. Unfortunately the main issue that she is expansion at this point is that she is having some issues with what appears to be some cellulitis in the right anterior shin. She has also been noting a little bit of uncomfortable feeling especially last night and her ankle area. I'm afraid that she made the developing a little bit of an infection. With that being said I think it is in the early stages. 12/28/17 on evaluation today patient's ankle appears to be doing excellent. She's making good progress at this point the cellulitis seems to have improved after last week's evaluation. Overall she is having no significant discomfort which is excellent news. She does have an appointment with Dr. dew on March 29, 2018 for reevaluation in regard to the stent he placed. She seems to have excellent blood flow in the right lower extremity. 01/19/12 on evaluation today patient's wound appears to be doing very well. In fact she does not appear to require debridement at this point, there's no evidence of infection, and overall  from the standpoint of the wound she seems to be doing very well. With that being said I believe that it may be time to switch to different dressing away from the [facility] Dressing she tells me she does have a lot going on her friend actually passed away yesterday and she's also having a lot of issues with her husband this obviously is weighing heavy on her as far as your thoughts and concerns today. 01/25/18 on evaluation today patient appears to be doing fairly well in regard to her right lateral malleolus. She has been tolerating the  dressing changes without complication. Overall I feel like this is definitely showing signs of improvement as far as how the overall appearance of the wound is there's also evidence of epithelium start to migrate over the granulation tissue. In general I think that she is progressing nicely as far as the wound is concerned. The only concern she really has is whether or not we can switch to every other week visits in order to avoid having as many appointments as her daughters have a difficult time getting her to her appointments as well as the patient's husband to his he is not doing very well at this point. 02/22/18 on evaluation today patient's right lateral malleolus ulcer appears to be doing great. She has been tolerating the dressing changes without complication. Overall you making excellent progress at this time. Patient is having no significant discomfort. 03/15/18 on evaluation today patient appears to be doing much more poorly in regard to her right lateral ankle ulcer at this point. Unfortunately since have last seen her her husband has passed just a few days ago is obviously weighed heavily on her her daughter also had surgery well she is with her today as usual. There does not appear to be any evidence of infection she does seem to have significant contusion/deep tissue injury to the right lateral malleolus which was not noted previous when I saw her last. It's hard to tell of exactly when this injury occurred although during the time she was spending the night in the hospital this may have been most likely. 03/22/18 on evaluation today patient appears to actually be doing very well in regard to her ulcer. She did unfortunately have a setback which was noted last week however the good news is we seem to be getting back on track and in fact the wound in the core did still have some necrotic tissue which will be addressed at this point today but in general I'm seeing signs that things are on  the up and up. She is glad to hear this obviously she's been somewhat concerned that due to the how her wound digressed more recently. 03/29/18 on evaluation today patient appears to be doing fairly well in regard to her right lower extremity lateral malleolus ulcer. She unfortunately does have a new area of pressure injury over the inferior portion where the wound has opened up a little bit larger secondary to the pressure she seems to be getting. She does tell me sometimes when she sleeps at night that it actually hurts and does seem to be pushing on the area little bit more unfortunately. There does not appear to be any evidence of infection which is good news. She has been tolerating the dressing changes without complication. She also did have some bruising in the left second and third toes due to the fact that she may have bump this or injured it although she has neuropathy so  she does not feel she did move recently that may have been where this came from. Nonetheless there does not appear to be any evidence of infection at this time. 04/12/18 on evaluation today patient's wound on the right lateral ankle actually appears to be doing a little bit better with a lot of necrotic docking tissue centrally loosening up in clearing away. However she does have the beginnings of a deep tissue injury on the left lateral malleolus likely due to the fact we've been trying offload the right as much as we have. I think she may benefit from an assistive soft device to help with offloading and it looks like they're looking at one of the doughnut conditions that wraps around the lower leg to offload which I think will definitely do a good job. With that being said I think we definitely need to address this issue on the left before it becomes a wound. Patient is not having significant pain. [name], [name] ([id number]) 04/19/18 on evaluation today patient appears to be doing excellent in regard to the progress  she's made with her right lateral ankle ulcer. The left ankle region which did show evidence of a deep tissue injury seems to be resolving there's little fluid noted underneath and a blister there's nothing open at this point in time overall I feel like this is progressing nicely which is good news. She does not seem to be having significant discomfort at this point which is also good news. 04/25/18-She is here in follow up evaluation for bilateral lateral malleolar ulcers. The right lateral malleolus ulcer with pale subcutaneous tissue exposure, central area of ulcer with tendon/periosteum exposed. The left lateral malleolus ulcer now with central area of nonviable tissue, otherwise deep tissue injury. She is wearing compression wraps to the left lower extremity, she will place the right lower extremity compression wraps on when she gets home. She will be out of town over the weekend and return next week and follow-up appointment. She completed her doxycycline this morning 05/03/18 on evaluation today patient appears to be doing very well in regard to her right lateral ankle ulcer in general. At least she's showing some signs of improvement in this regard. Unfortunately she has some additional injury to the left lateral malleolus region which appears to be new likely even over the past several days. Again this determination is based on the overall appearance. With that being said the patient is obviously frustrated about this currently. 05/10/18-She is here in follow-up evaluation for bilateral lateral malleolar ulcers. She states she has purchased offloading shoes/boots and they will arrive tomorrow. She was asked to bring them in the office at next week's appointment so her provider is aware of product being utilized. She continues to sleep on right or left side, she has been encouraged to sleep on her back. The right lateral malleolus ulcer is precariously close to peri-osteum; will order xray. The  left lateral malleolus ulcer is improved. Will switch back to santyl; she will follow up next week. 05/17/18 on evaluation today patient actually appears to be doing very well in regard to her malleolus her ulcers compared to last time I saw them. She does not seem to have as much in the way of contusion at this point which is great news. With that being said she does continue to have discomfort and I do believe that she is still continuing to benefit from the offloading/pressure reducing boots that were recommended. I think this is the key to  trying to get this to heal up completely. 05/24/18 on evaluation today patient actually appears to be doing worse at this point in time unfortunately compared to her last week's evaluation. She is having really no increased pain which is good news unfortunately she does have more maceration in your theme and noted surrounding the right lateral ankle the left lateral ankle is not really is erythematous I do not see signs of the overt cellulitis on that side. Unfortunately the wounds do not seem to have shown any signs of improvement since the last evaluation. She also has significant swelling especially on the right compared to previous some of this may be due to infection however also think that she may be served better while she has these wounds by compression wrapping versus continuing to use the Juxta-Lite for the time being. Especially with the amount of drainage that she is experiencing at this point. No fevers, chills, nausea, or vomiting noted at this time. 05/31/18 on evaluation today patient appears to actually be doing better in regard to her right lateral lower extremity ulcer specifically on the malleolus region. She has been tolerating the antibiotic without complication. With that being said she still continues to have issues but a little bit of redness although nothing like she what she was experiencing previous. She still continues to pressure to  her ankle area she did get the problem on offloading boots unfortunately she will not wear them she states there too uncomfortable and she can't get in and out of the bed. Nonetheless at this point her wounds seem to be continually getting worse which is not what we want I'm getting somewhat concerned about her progress and how things are going to proceed if we do not intervene in some way shape or form. I therefore had a very lengthy conversation today about offloading yet again and even made a specific suggestion for switching her to a memory foam mattress and even gave the information for a specific one that they could look at getting if it was something that they were interested in considering. She does not want to be considered for a hospital bed air mattress although honestly insurance would not cover it that she does not have any wounds on her trunk. 06/14/18 on evaluation today both wounds over the bilateral lateral malleolus her ulcers appear to be doing better there's no evidence of pressure injury at this point. She did get the foam mattress for her bed and this does seem to have been extremely beneficial for her in my pinion. Her daughter states that she is having difficulty getting out of bed because of how soft it is. The patient also relates this to be. Nonetheless I do feel like she's actually doing better. Unfortunately right after and around the time she was getting the mattress she also sustained a fall when she got up to go pick up the phone and ended up injuring her right elbow she has [address] sut[address]. We are not caring for this currently although home health is going to be taking the sutures out shortly. Nonetheless this may be something that we need to evaluate going forward. It depends on how well it has or has not healed in the end. She also recently saw an orthopedic specialist for an injection in the right shoulder just before her fall unfortunately the fall seems to  have worsened her pain. 06/21/18 on evaluation today patient appears to be doing about the same in regard to her  lateral malleolus ulcers. Both appear to be just a little bit deeper but again we are clinging away the necrotic and dead tissue which I think is why this is progressing towards a deeper realm as opposed to improving from my measurement standpoint in that regard. Nonetheless she has been tolerating the dressing changes she absolutely hates the memory foam mattress topper that was obtained for her nonetheless [name], [name] [name] ([id number]) I do believe this is still doing excellent as far as taking care of excess pressure in regard to the lateral malleolus regions. She in fact has no pressure injury that I see whereas in weeks past it was week by week I was constantly seeing new pressure injuries. Overall I think it has been very beneficial for her. 07/03/18; patient arrives in my clinic today. She has deep punched out areas over her bilateral lateral malleoli. The area on the right has some more depth. We spent a lot of time today talking about pressure relief for these areas. This started when her daughter asked for a prescription for a memory foam mattress. I have never written a prescription for a mattress and I don't think insurances would pay for that on an ordinary bed. In any case he came up that she has foam boots that she refuses to wear. I would suggest going to these before any other offloading issues when she is in bed. They say she is meticulous about offloading this the rest of the day 07/10/18- She is seen in follow-up evaluation for bilateral, lateral malleolus ulcers. There is no improvement in the ulcers. She has purchased and is sleeping on a memory foam mattress/overlay, she has been using the offloading boots nightly over the past week. She has a follow up appointment with vascular medicine at the end of October, in my opinion this follow up should be expedited given  her deterioration and suboptimal TBI results. We will order plain film xray of the left ankle as deeper structures are palpable; would consider having MRI, regardless of xray report(s). The ulcers will be treated with iodoflex/iodosorb, she is unable to safely change the dressings daily with santyl. 07/19/18 on evaluation today patient appears to be doing in general visually well in regard to her bilateral lateral malleolus ulcers. She has been tolerating the dressing changes without complication which is good news. With that being said we did have an x-ray performed on 07/12/18 which revealed a slight loosen see in the lateral portion of the distal left fibula which may represent artifact but underline lytic destruction or osteomyelitis could not be excluded. MRI was recommended. With that being said we can see about getting the patient scheduled for an MRI to further evaluate this area. In fact we have that scheduled currently for August 20 19,019. Patient History Information obtained from Patient. Family History Cancer - Father,Siblings, Heart Disease - Siblings, No family history of Diabetes, Hereditary Spherocytosis, Hypertension, Kidney Disease, Lung Disease, Seizures, Stroke, Thyroid Problems, Tuberculosis. Social History Never smoker, Marital Status - Married, Alcohol Use - Never, Drug Use - No History, Caffeine Use - Rarely. Review of Systems (ROS) Constitutional Symptoms (General Health) Denies complaints or symptoms of Fever, Chills. Respiratory The patient has no complaints or symptoms. Cardiovascular Complains or has symptoms of LE edema. Psychiatric The patient has no complaints or symptoms. Objective Constitutional Romelia, Bromell [name] ([id number]) Well-nourished and well-hydrated in no acute distress. Vitals Time Taken: 11:16 AM, Height: 65 in, Weight: 154.3 lbs, BMI: 25.7, Temperature: 98.1 F, Pulse: 83 bpm,  Respiratory Rate: 18 breaths/min, Blood Pressure: 119/55  mmHg. Respiratory normal breathing without difficulty. Psychiatric this patient is able to make decisions and demonstrates good insight into disease process. Alert and Oriented x 3. pleasant and cooperative. General Notes: At this point patient's wound bed shows evidence of good granulation especially compared to when I last evaluated her wound. With that being said the Juxta-Lite compression wrap seem to be doing very well for her as far as controlling the fluid and in general I think she is showing signs of excellent improvement. With that being said we do need to do them ride the left ankle just to ensure there's nothing going on at this point from the standpoint of osteomyelitis in my pinion. Sharp debridement was performed to both wounds to remove slough today she tolerated this without complication. Integumentary (Hair, Skin) Wound #1 status is Open. Original cause of wound was Gradually Appeared. The wound is located on the Right,Lateral Malleolus. The wound measures 1.9cm length x 2.3cm width x 0.8cm depth; 3.432cm^2 area and 2.746cm^3 volume. There is Fat Layer (Subcutaneous Tissue) Exposed exposed. There is a medium amount of purulent drainage noted. Foul odor after cleansing was noted. The wound margin is distinct with the outline attached to the wound base. There is small (1-33%) pink granulation within the wound bed. There is a large (67-100%) amount of necrotic tissue within the wound bed including Eschar and Adherent Slough. The periwound skin appearance exhibited: Maceration, Rubor, Erythema. The periwound skin appearance did not exhibit: Callus, Crepitus, Excoriation, Induration, Rash, Scarring, Dry/Scaly, Atrophie Blanche, Cyanosis, Ecchymosis, Hemosiderin Staining, Mottled, Pallor. The surrounding wound skin color is noted with erythema which is circumferential. Periwound temperature was noted as No Abnormality. The periwound has tenderness on palpation. Wound #2 status is  Open. Original cause of wound was Gradually Appeared. The wound is located on the Left,Lateral Malleolus. The wound measures 2.4cm length x 1.9cm width x 0.6cm depth; 3.581cm^2 area and 2.149cm^3 volume. There is Fat Layer (Subcutaneous Tissue) Exposed exposed. There is no tunneling or undermining noted. There is a large amount of purulent drainage noted. Foul odor after cleansing was noted. The wound margin is flat and intact. There is small (1-33%) pink granulation within the wound bed. There is a large (67-100%) amount of necrotic tissue within the wound bed including Eschar and Adherent Slough. The periwound skin appearance exhibited: Maceration, Erythema. The periwound skin appearance did not exhibit: Callus, Crepitus, Excoriation, Induration, Rash, Scarring, Dry/Scaly, Atrophie Blanche, Cyanosis, Ecchymosis, Hemosiderin Staining, Mottled, Pallor, Rubor. The surrounding wound skin color is noted with erythema which is circumferential. Periwound temperature was noted as No Abnormality. 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 There was a Excisional Skin/Subcutaneous Tissue Debridement with a total area of 4.37 sq cm performed by STONE III, Bruce Mayers E., PA-C. With the following instrument(s): Curette to remove Viable and Non-Viable tissue/material. Material removed includes Subcutaneous Tissue and Slough and after achieving pain control using Lidocaine 4% Topical Solution. No specimens were taken. A time out was conducted at  11:59, prior to the start of the procedure. A Minimum amount of bleeding  was controlled with Pressure. The procedure was tolerated well with a pain level of 0 throughout and a pain level of 0 following the procedure. Patient s Level of Consciousness post procedure was recorded as Awake and Alert. Post Debridement Measurements: 1.9cm length x 2.3cm width x 0.9cm depth; 3.089cm^3 volume. Post debridement Stage noted as Category/Stage IV. Character of Wound/Ulcer Post Debridement is improved. Severity of Tissue Post Debridement is: Fat layer exposed. Post procedure Diagnosis Wound #1: Same as Pre-Procedure Wound #2 Pre-procedure diagnosis of Wound #2 is a Pressure Ulcer located on the Left,Lateral Malleolus .Severity of Tissue Pre Debridement is: Fat layer exposed. There was a Excisional Skin/Subcutaneous Tissue Debridement with a total area of 4.56 sq cm performed by STONE III, Leza Apsey E., PA-C. With the following instrument(s): Curette to remove Viable and Non-Viable tissue/material. Material removed includes Subcutaneous Tissue and Slough and after achieving pain control using Lidocaine 4% Topical Solution. No specimens were taken. A time out was conducted at 11:57, prior to the start of the procedure. A Minimum amount of bleeding was controlled with Pressure. The procedure was tolerated well with a pain level of 0 throughout and a pain level of 0 following the procedure. Patient s Level of Consciousness post procedure was recorded as Awake and Alert. Post Debridement Measurements: 2.4cm length x 1.9cm width x 0.6cm depth; 2.149cm^3 volume. Post debridement Stage noted as Category/Stage IV. Character of Wound/Ulcer Post Debridement is improved. Severity of Tissue Post Debridement is: Fat layer exposed. Post procedure Diagnosis Wound #2: Same as Pre-Procedure Plan Wound Cleansing: Wound #1 Right,Lateral Malleolus: Clean wound with Normal Saline. Cleanse wound with mild soap and water May Shower,  gently pat wound dry prior to applying new dressing. Wound #2 Left,Lateral Malleolus: Clean wound with Normal Saline. Cleanse wound with mild soap and water May Shower, gently pat wound dry prior to applying new dressing. Anesthetic (add to Medication List): Wound #1 Right,Lateral Malleolus: Topical Lidocaine 4% cream applied to wound bed prior to debridement (In Clinic Only). Wound #2 Left,Lateral Malleolus: Topical Lidocaine 4% cream applied to wound bed prior to debridement (In Clinic Only). Primary Wound Dressing: Wound #1 Right,Lateral Malleolus: Iodoflex Wound #2 Left,Lateral Malleolus: CAPRICIA, SERDA ([id number]) Secondary Dressing: Wound #1 Right,Lateral Malleolus: Boardered Foam Dressing Wound #2 Left,Lateral Malleolus: Boardered Foam Dressing Dressing Change Frequency: Wound #1 Right,Lateral Malleolus: Change Dressing Monday, Wednesday, Friday Wound #2 Left,Lateral Malleolus: Change Dressing Monday, Wednesday, Friday Follow-up Appointments: Wound #1 Right,Lateral Malleolus: Return Appointment in 1 week. Wound #2 Left,Lateral Malleolus: Return Appointment in 1 week. Edema Control: Wound #1 Right,Lateral Malleolus: Patient to wear own Velcro compression garment. Elevate legs to the level of the heart and pump ankles as often as possible Wound #2 Left,Lateral Malleolus: Patient to wear own Velcro compression garment. Elevate legs to the level of the heart and pump ankles as often as possible Off-Loading: Wound #1 Right,Lateral Malleolus: Mattress Turn and reposition every 2 hours Other: - do not put pressure on your right ankle Wound #2 Left,Lateral Malleolus: Mattress Turn and reposition every 2 hours Other: - do not put pressure on your right ankle Additional Orders / Instructions: Wound #1 Right,Lateral Malleolus: Increase protein intake. Wound #2 Left,Lateral Malleolus: Increase protein intake. Home Health: Wound #1 Right,Lateral  Malleolus: [place] Visits - [place] Nurse may visit PRN to address patient s wound care needs. FACE TO FACE ENCOUNTER: MEDICARE and MEDICAID PATIENTS: I certify that this patient is under my care and that I had a face-to-face encounter  that meets the physician face-to-face encounter requirements with this patient on this date. The encounter with the patient was in whole or in part for the following MEDICAL CONDITION: (primary reason for [place]) MEDICAL NECESSITY: I certify, that based on my findings, NURSING services are a medically necessary home health service. HOME BOUND STATUS: I certify that my clinical findings support that this patient is homebound (i.e., Due to illness or injury, pt requires aid of supportive devices such as crutches, cane, wheelchairs, walkers, the use of special transportation or the assistance of another person to leave their place of residence. There is a normal inability to leave the home and doing so requires considerable and taxing effort. Other absences are for medical reasons / religious services and are infrequent or of short duration when for other reasons). If current dressing causes regression in wound condition, may D/C ordered dressing product/s and apply Normal Saline Moist Dressing daily until next [place] / Other MD appointment. [place] of regression in wound condition at [phone]. Please direct any NON-WOUND related issues/requests for orders to patient's Primary Care Physician Wound #2 Left,Lateral Malleolus: [place] Visits - [place] Nurse may visit PRN to address patient s wound care needs. FACE TO FACE ENCOUNTER: MEDICARE and MEDICAID PATIENTS: I certify that this patient is under my care and that I had a face-to-face encounter that meets the physician face-to-face encounter requirements with this patient on this date. The encounter with the patient was  in whole or in part for the following MEDICAL CONDITION: (primary reason for [place]) MEDICAL NECESSITY: I certify, that based on my findings, NURSING services are a medically necessary home LEEA, RAMBEAU ([id number]) health service. HOME BOUND STATUS: I certify that my clinical findings support that this patient is homebound (i.e., Due to illness or injury, pt requires aid of supportive devices such as crutches, cane, wheelchairs, walkers, the use of special transportation or the assistance of another person to leave their place of residence. There is a normal inability to leave the home and doing so requires considerable and taxing effort. Other absences are for medical reasons / religious services and are infrequent or of short duration when for other reasons). If current dressing causes regression in wound condition, may D/C ordered dressing product/s and apply Normal Saline Moist Dressing daily until next [place] / Other MD appointment. [place] of regression in wound condition at [phone]. Please direct any NON-WOUND related issues/requests for orders to patient's Primary Care Physician Radiology ordered were: MRI, lower extremity without contrast - left ankle Currently we do have the MRI set up again for August 01, 2018. We conceptually see her back for reevaluation to see were things stand. Patient is in agreement with plan. If anything changes or worsens she will let us know. Please see above for specific wound care orders. We will see patient for re-evaluation in 1 week(s) here in the clinic. If anything worsens or changes patient will contact our office for additional recommendations. Electronic Signature(s) Signed: 07/22/2018 8:05:21 AM By: Worthy Keeler PA-C Entered By: Worthy Keeler on 07/19/2018 13:13:22 Stotts, [name] ([id number]) -------------------------------------------------------------------------------- ROS/PFSH  Details Patient Name: [name] Date of Service: 07/19/2018 11:00 AM Medical Record Number: [id number] Patient Account Number: [id number] Date of Birth/Sex: 1923/01/30 (84 y.o. F) Treating RN: Montey Hora Primary Care Provider: Grayland Ormond Other Clinician: Referring Provider: FITZGERALD, DAVID Treating Provider/Extender: STONE III, Natayla Cadenhead Weeks in Treatment: 108 Information  Obtained From Patient Wound History Do you currently have one or more open woundso Yes How many open wounds do you currently haveo 1 Approximately how long have you had your woundso 2 yrs How have you been treating your wound(s) until nowo mupirocin, soaking in epsom salt Has your wound(s) ever healed and then re-openedo No Have you had any lab work done in the past montho No Have you tested positive for an antibiotic resistant organism (MRSA, VRE)o No Have you tested positive for osteomyelitis (bone infection)o No Have you had any tests for circulation on your legso Yes Who ordered the testo Dr. Lucky Cowboy [facility] the test [facility] Constitutional Symptoms (General Health) Complaints and Symptoms: Negative for: Fever; Chills Cardiovascular Complaints and Symptoms: Positive for: LE edema Medical History: Positive for: Congestive Heart Failure; Hypertension Eyes Medical History: Positive for: Cataracts - surgery Respiratory Complaints and Symptoms: No Complaints or Symptoms Musculoskeletal Medical History: Positive for: Osteoarthritis Neurologic Medical History: Positive for: Neuropathy Oncologic FRONIA, DEPASS ([id number]) Medical History: Negative for: Received Chemotherapy; Received Radiation Psychiatric Complaints and Symptoms: No Complaints or Symptoms HBO Extended History Items Eyes: Cataracts Immunizations Pneumococcal Vaccine: Received Pneumococcal Vaccination: Yes Implantable Devices Family and Social History Cancer: Yes - Father,Siblings; Diabetes: No; Heart Disease:  Yes - Siblings; Hereditary Spherocytosis: No; Hypertension: No; Kidney Disease: No; Lung Disease: No; Seizures: No; Stroke: No; Thyroid Problems: No; Tuberculosis: No; Never smoker; Marital Status - Married; Alcohol Use: Never; Drug Use: No History; Caffeine Use: Rarely; Financial Concerns: No; Food, Clothing or Shelter Needs: No; Support System Lacking: No; Transportation Concerns: No; Advanced Directives: No; Patient does not want information on Advanced Directives; Do not resuscitate: No; Living Will: Yes (Not Provided); Medical Power of Attorney: No Physician Affirmation I have reviewed and agree with the above information. Electronic Signature(s) Signed: 07/19/2018 5:05:24 PM By: Montey Hora Signed: 07/22/2018 8:05:21 AM By: Worthy Keeler PA-C Entered By: Worthy Keeler on 07/19/2018 13:12:19 Kamp, [name] ([id number]) -------------------------------------------------------------------------------- SuperBill Details Patient Name: [name] Date of Service: 07/19/2018 Medical Record Number: [id number] Patient Account Number: [id number] Date of Birth/Sex: 1923/09/24 (84 y.o. 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 I70.243 Atherosclerosis of native arteries of left leg with ulceration of ankle I89.0 Lymphedema, not elsewhere classified B35.4 Tinea corporis Facility Procedures CPT4 Code: 32202542 Description: 70623 - DEB SUBQ TISSUE 20 SQ CM/< ICD-10 Diagnosis Description 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, stage 4 L89.524 Pressure ulcer of left ankle, stage 4 Modifier: Quantity: 1 Physician  Procedures CPT4 Code: 7628315 Description: 17616 - WC PHYS LEVEL 4 - EST PT ICD-10 Diagnosis Description 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, stage 4 L89.524 Pressure ulcer of left ankle, stage 4 I70.233 Atherosclerosis of native arteries of right leg with ulceration I70.243  Atherosclerosis of native arteries of left leg with ulceration Modifier: 25 of ankle of ankle Quantity: 1 CPT4 Code: 0737106 Description: 26948 - WC PHYS SUBQ TISS 20 SQ CM ICD-10 Diagnosis Description 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, stage 4 L89.524 Pressure ulcer of left ankle, stage 4 Modifier: Quantity: 1 Electronic Signature(s) Signed: 07/22/2018 8:05:21 AM By: Worthy Keeler PA-C Entered By: Worthy Keeler on 07/19/2018 13:13:51

## 2018-08-03 NOTE — Progress Notes (Signed)
RINNAH, PEPPEL ([id number]) Visit Report for 07/26/2018 Arrival Information Details Patient Name: [name], [name] Date of Service: 07/26/2018 11:00 AM Medical Record Number: [id number] Patient Account Number: [id number] Date of Birth/Sex: Oct 23, 1923 (84 y.o. F) Treating RN: Ahmed Prima Primary Care Helaina Stefano: Grayland Ormond Other Clinician: Referring Lloyd [name]: FITZGERALD, DAVID Treating Currie Dennin/Extender: STONE III, HOYT Weeks in Treatment: 81 Visit Information History Since Last Visit All ordered tests and consults were completed: No Patient Arrived: Kasandra Knudsen Added or deleted any medications: No Arrival Time: 11:05 Any new allergies or adverse reactions: No Accompanied By: self Had a fall or experienced change in No Transfer Assistance: EasyPivot Patient activities of daily living that may affect Lift risk of falls: Patient Identification Verified: Yes Signs or symptoms of abuse/neglect since last visito No Secondary Verification Process Yes Hospitalized since last visit: No Completed: Implantable device outside of the clinic excluding No Patient Requires Transmission-Based No cellular tissue based products placed in the center Precautions: since last visit: Patient Has Alerts: Yes Has Dressing in Place as Prescribed: Yes Patient Alerts: ABI AVVS 03/29/18 L Pain Present Now: Yes 1.05 R 1.03, TBI L .56, R .85 Electronic Signature(s) Signed: 07/26/2018 3:54:45 PM By: Alric Quan Entered By: Alric Quan on 07/26/2018 11:05:50 Bisceglia, [name] ([id number]) -------------------------------------------------------------------------------- Lower Extremity Assessment Details Patient Name: [name] Date of Service: 07/26/2018 11:00 AM Medical Record Number: [id number] Patient Account Number: [id number] Date of Birth/Sex: [birth year] (84 y.o. F) Treating RN: Ahmed Prima Primary Care Dafna Romo: Grayland Ormond Other Clinician: Referring Stephanieann Popescu:  FITZGERALD, DAVID Treating Adelisa Satterwhite/Extender: STONE III, HOYT Weeks in Treatment: 50 Edema Assessment Assessed: [Left: No] [Right: No] [Left: Edema] [Right: :] Calf Left: Right: Point of Measurement: 29 cm From Medial Instep 28.4 cm 26.7 cm Ankle Left: Right: Point of Measurement: 12 cm From Medial Instep 19.1 cm 18 cm Vascular Assessment Pulses: Dorsalis Pedis Palpable: [Left:Yes] [Right:Yes] Posterior Tibial Extremity colors, hair growth, and conditions: Extremity Color: [Left:Hyperpigmented] [Right:Hyperpigmented] Temperature of Extremity: [Left:Warm] [Right:Warm] Capillary Refill: [Left:< 3 seconds] [Right:< 3 seconds] Toe Nail Assessment Left: Right: Thick: Yes Yes Discolored: Yes Yes Deformed: Yes Yes Improper Length and Hygiene: No No Electronic Signature(s) Signed: 07/26/2018 3:54:45 PM By: Alric Quan Entered By: Alric Quan on 07/26/2018 11:21:31 Klier, [name] ([id number]) -------------------------------------------------------------------------------- Multi Wound Chart Details Patient Name: [name] Date of Service: 07/26/2018 11:00 AM Medical Record Number: [id number] Patient Account Number: [id number] Date of Birth/Sex: 04-21-1923 (84 y.o. F) Treating RN: Montey Hora Primary Care Ramses Klecka: Grayland Ormond Other Clinician: Referring Iran Kievit: FITZGERALD, DAVID Treating Saif Peter/Extender: STONE III, HOYT Weeks in Treatment: 50 Vital Signs Height(in): 65 Pulse(bpm): 91 Weight(lbs): 154.3 Blood Pressure(mmHg): 112/62 Body Mass Index(BMI): 26 Temperature(F): 98.2 Respiratory Rate 18 (breaths/min): Photos: [1:No Photos] [2:No Photos] [N/A:N/A] Wound Location: [1:Right Malleolus - Lateral] [2:Left Malleolus - Lateral] [N/A:N/A] Wounding Event: [1:Gradually Appeared] [2:Gradually Appeared] [N/A:N/A] Primary Etiology: [1:Pressure Ulcer] [2:Pressure Ulcer] [N/A:N/A] Secondary Etiology: [1:Arterial Insufficiency Ulcer] [2:Arterial  Insufficiency Ulcer] [N/A:N/A] Comorbid History: [1:Cataracts, Congestive Heart Failure, Hypertension, Osteoarthritis, Neuropathy] [2:Cataracts, Congestive Heart Failure, Hypertension, Osteoarthritis, Neuropathy] [N/A:N/A] Date Acquired: [1:08/11/2015] [2:04/12/2018] [N/A:N/A] Weeks of Treatment: [1:50] [2:14] [N/A:N/A] Wound Status: [1:Open] [2:Open] [N/A:N/A] Measurements L x W x D [1:1.6x1.3x0.6] [2:2x1.5x0.7] [N/A:N/A] (cm) Area (cm) : [1:1.634] [2:2.356] [N/A:N/A] Volume (cm) : [1:0.98] [2:1.649] [N/A:N/A] % Reduction in Area: [1:-6.70%] [2:-200.10%] [N/A:N/A] % Reduction in Volume: [1:-113.50%] [2:-1987.30%] [N/A:N/A] Classification: [1:Category/Stage IV] [2:Category/Stage IV] [N/A:N/A] Exudate Amount: [1:Medium] [2:Large] [N/A:N/A] Exudate Type: [1:Purulent] [2:Purulent] [N/A:N/A] Exudate Color: [1:yellow, brown, green] [2:yellow, brown, green] [N/A:N/A] Foul Odor After  Cleansing: [1:Yes] [2:Yes] [N/A:N/A] Odor Anticipated Due to [1:No] [2:No] [N/A:N/A] Product Use: Wound Margin: [1:Distinct, outline attached] [2:Flat and Intact] [N/A:N/A] Granulation Amount: [1:Small (1-33%)] [2:Small (1-33%)] [N/A:N/A] Granulation Quality: [1:Pink] [2:Pink] [N/A:N/A] Necrotic Amount: [1:Large (67-100%)] [2:Large (67-100%)] [N/A:N/A] Necrotic Tissue: [1:Eschar, Adherent Slough] [2:Eschar, Adherent Slough] [N/A:N/A] Exposed Structures: [1:Fat Layer (Subcutaneous Tissue) Exposed: Yes Fascia: No Tendon: No Muscle: No Joint: No Bone: No] [2:Fat Layer (Subcutaneous Tissue) Exposed: Yes Fascia: No Tendon: No Muscle: No Joint: No Bone: No] [N/A:N/A] Epithelialization: [1:None] [2:None] [N/A:N/A] Periwound Skin Texture: Excoriation: No Excoriation: No N/A Induration: No Induration: No Callus: No Callus: No Crepitus: No Crepitus: No Rash: No Rash: No Scarring: No Scarring: No Periwound Skin Moisture: Maceration: Yes Maceration: Yes N/A Dry/Scaly: No Dry/Scaly: No Periwound Skin  Color: Erythema: Yes Erythema: Yes N/A Rubor: Yes Atrophie Blanche: No Atrophie Blanche: No Cyanosis: No Cyanosis: No Ecchymosis: No Ecchymosis: No Hemosiderin Staining: No Hemosiderin Staining: No Mottled: No Mottled: No Pallor: No Pallor: No Rubor: No Erythema Location: Circumferential Circumferential N/A Temperature: No Abnormality No Abnormality N/A Tenderness on Palpation: Yes Yes N/A Wound Preparation: Ulcer Cleansing: Ulcer Cleansing: N/A Rinsed/Irrigated with Saline Rinsed/Irrigated with Saline Topical Anesthetic Applied: Topical Anesthetic Applied: Other: lidocaine 4% Other: lidocaine 4% Treatment Notes Electronic Signature(s) Signed: 07/26/2018 4:18:40 PM By: Montey Hora Entered By: Montey Hora on 07/26/2018 11:38:02 Shieh, [name] ([id number]) -------------------------------------------------------------------------------- [place] Details Patient Name: [name] Date of Service: 07/26/2018 11:00 AM Medical Record Number: [id number] Patient Account Number: [id number] Date of Birth/Sex: [birth year] (84 y.o. F) Treating RN: Montey Hora Primary Care Isis Costanza: Grayland Ormond Other Clinician: Referring Urbano Milhouse: FITZGERALD, DAVID Treating Liddie Chichester/Extender: STONE III, HOYT Weeks in Treatment: 3 Active Inactive ` Abuse / Safety / Falls / Self Care Management Nursing Diagnoses: Potential for falls Goals: Patient will not experience any injury related to falls Date Initiated: 08/10/2017 Target Resolution Date: 11/10/2017 Goal Status: Active Interventions: Assess Activities of Daily Living upon admission and as needed Assess fall risk on admission and as needed Assess: immobility, friction, shearing, incontinence upon admission and as needed Notes: ` Nutrition Nursing Diagnoses: Imbalanced nutrition Potential for alteratiion in Nutrition/Potential for imbalanced nutrition Goals: Patient/caregiver agrees to and  verbalizes understanding of need to use nutritional supplements and/or vitamins as prescribed Date Initiated: 08/10/2017 Target Resolution Date: 12/08/2017 Goal Status: Active Interventions: Assess patient nutrition upon admission and as needed per policy Notes: ` Orientation to the Wound Care Program Nursing Diagnoses: Knowledge deficit related to the wound healing center program Goals: Patient/caregiver will verbalize understanding of the [place] Program Date Initiated: 08/10/2017 Target Resolution Date: 09/08/2017 [name], [name] ([id number]) Goal Status: Active Interventions: Provide education on orientation to the wound center Notes: ` Pain, Acute or Chronic Nursing Diagnoses: Pain, acute or chronic: actual or potential Potential alteration in comfort, pain Goals: Patient/caregiver will verbalize adequate pain control between visits Date Initiated: 08/10/2017 Target Resolution Date: 12/08/2017 Goal Status: Active Interventions: Complete pain assessment as per visit requirements Notes: ` Wound/Skin Impairment Nursing Diagnoses: Impaired tissue integrity Knowledge deficit related to ulceration/compromised skin integrity Goals: Ulcer/skin breakdown will have a volume reduction of 80% by week 12 Date Initiated: 08/10/2017 Target Resolution Date: 12/01/2017 Goal Status: Active Interventions: Assess patient/caregiver ability to perform ulcer/skin care regimen upon admission and as needed Notes: Electronic Signature(s) Signed: 07/26/2018 4:18:40 PM By: Montey Hora Entered By: Montey Hora on 07/26/2018 11:37:46 [name], [name] ([id number]) -------------------------------------------------------------------------------- Pain Assessment Details Patient Name: [name] Date of Service: 07/26/2018 11:00 AM Medical Record  Number: [id number] Patient Account Number: [id number] Date of Birth/Sex: [birth year] (84 y.o. F) Treating RN: Ahmed Prima Primary Care Axle Parfait: Grayland Ormond Other Clinician: Referring Danyel Tobey: FITZGERALD, DAVID Treating Geraldine Tesar/Extender: STONE III, HOYT Weeks in Treatment: 50 Active Problems Location of Pain Severity and Description of Pain Patient Has Paino Yes Site Locations Rate the pain. Current Pain Level: 5 Character of Pain Describe the Pain: Aching Pain Management and Medication Current Pain Management: Electronic Signature(s) Signed: 07/26/2018 3:54:45 PM By: Alric Quan Entered By: Alric Quan on 07/26/2018 11:06:00 Yodice, [name] ([id number]) -------------------------------------------------------------------------------- Wound Assessment Details Patient Name: [name] Date of Service: 07/26/2018 11:00 AM Medical Record Number: [id number] Patient Account Number: [id number] Date of Birth/Sex: 02/11/1923 (84 y.o. F) Treating RN: Ahmed Prima Primary Care Jamorris Ndiaye: Grayland Ormond Other Clinician: Referring Forrester Blando: FITZGERALD, DAVID Treating Noretta Frier/Extender: STONE III, HOYT Weeks in Treatment: 50 Wound Status Wound Number: 1 Primary Pressure Ulcer Etiology: Wound Location: Right Malleolus - Lateral Secondary Arterial Insufficiency Ulcer Wounding Event: Gradually Appeared Etiology: Date Acquired: 08/11/2015 Wound Status: Open Weeks Of Treatment: 50 Comorbid Cataracts, Congestive Heart Failure, Clustered Wound: No History: Hypertension, Osteoarthritis, Neuropathy Photos Photo Uploaded By: Alric Quan on 07/26/2018 11:46:47 Wound Measurements Length: (cm) 1.6 Width: (cm) 1.3 Depth: (cm) 0.6 Area: (cm) 1.634 Volume: (cm) 0.98 % Reduction in Area: -6.7% % Reduction in Volume: -113.5% Epithelialization: None Tunneling: No Undermining: No Wound Description Classification: Category/Stage IV Wound Margin: Distinct, outline attached Exudate Amount: Medium Exudate Type: Purulent Exudate Color: yellow, brown, green Foul Odor After  Cleansing: Yes Due to Product Use: No Slough/Fibrino Yes Wound Bed Granulation Amount: Small (1-33%) Exposed Structure Granulation Quality: Pink Fascia Exposed: No Necrotic Amount: Large (67-100%) Fat Layer (Subcutaneous Tissue) Exposed: Yes Necrotic Quality: Eschar, Adherent Slough Tendon Exposed: No Muscle Exposed: No Joint Exposed: No Bone Exposed: No Periwound Skin Texture Gemmill, [name] ([id number]) Texture Color No Abnormalities Noted: No No Abnormalities Noted: No Callus: No Atrophie Blanche: No Crepitus: No Cyanosis: No Excoriation: No Ecchymosis: No Induration: No Erythema: Yes Rash: No Erythema Location: Circumferential Scarring: No Hemosiderin Staining: No Mottled: No Moisture Pallor: No No Abnormalities Noted: No Rubor: Yes Dry / Scaly: No Maceration: Yes Temperature / Pain Temperature: No Abnormality Tenderness on Palpation: Yes Wound Preparation Ulcer Cleansing: Rinsed/Irrigated with Saline Topical Anesthetic Applied: Other: lidocaine 4%, Electronic Signature(s) Signed: 07/26/2018 3:54:45 PM By: Alric Quan Entered By: Alric Quan on 07/26/2018 11:17:58 Topham, [name] ([id number]) -------------------------------------------------------------------------------- Wound Assessment Details Patient Name: [name] Date of Service: 07/26/2018 11:00 AM Medical Record Number: [id number] Patient Account Number: [id number] Date of Birth/Sex: June 03, 1923 (84 y.o. F) Treating RN: Ahmed Prima Primary Care Randye Treichler: Grayland Ormond Other Clinician: Referring Clements Toro: FITZGERALD, DAVID Treating Demosthenes Virnig/Extender: STONE III, HOYT Weeks in Treatment: 50 Wound Status Wound Number: 2 Primary Pressure Ulcer Etiology: Wound Location: Left Malleolus - Lateral Secondary Arterial Insufficiency Ulcer Wounding Event: Gradually Appeared Etiology: Date Acquired: 04/12/2018 Wound Status: Open Weeks Of Treatment: 14 Comorbid Cataracts,  Congestive Heart Failure, Clustered Wound: No History: Hypertension, Osteoarthritis, Neuropathy Photos Photo Uploaded By: Alric Quan on 07/26/2018 11:46:48 Wound Measurements Length: (cm) 2 Width: (cm) 1.5 Depth: (cm) 0.7 Area: (cm) 2.356 Volume: (cm) 1.649 % Reduction in Area: -200.1% % Reduction in Volume: -1987.3% Epithelialization: None Tunneling: No Undermining: No Wound Description Classification: Category/Stage IV Foul Wound Margin: Flat and Intact Due Exudate Amount: Large Slou Exudate Type: Purulent Exudate Color: yellow, brown, green Odor After Cleansing: Yes to Product Use: No gh/Fibrino Yes Wound Bed Granulation Amount: Small (1-33%) Exposed  Structure Granulation Quality: Pink Fascia Exposed: No Necrotic Amount: Large (67-100%) Fat Layer (Subcutaneous Tissue) Exposed: Yes Necrotic Quality: Eschar, Adherent Slough Tendon Exposed: No Muscle Exposed: No Joint Exposed: No Bone Exposed: No Periwound Skin Texture Jost, [name] ([id number]) Texture Color No Abnormalities Noted: No No Abnormalities Noted: No Callus: No Atrophie Blanche: No Crepitus: No Cyanosis: No Excoriation: No Ecchymosis: No Induration: No Erythema: Yes Rash: No Erythema Location: Circumferential Scarring: No Hemosiderin Staining: No Mottled: No Moisture Pallor: No No Abnormalities Noted: No Rubor: No Dry / Scaly: No Maceration: Yes Temperature / Pain Temperature: No Abnormality Tenderness on Palpation: Yes Wound Preparation Ulcer Cleansing: Rinsed/Irrigated with Saline Topical Anesthetic Applied: Other: lidocaine 4%, Electronic Signature(s) Signed: 07/26/2018 3:54:45 PM By: Alric Quan Entered By: Alric Quan on 07/26/2018 11:15:38 Mcmurtrey, [name] ([id number]) -------------------------------------------------------------------------------- [place] Details Patient Name: [name] Date of Service: 07/26/2018 11:00 AM Medical Record  Number: [id number] Patient Account Number: [id number] Date of Birth/Sex: 12-26-1922 (84 y.o. F) Treating RN: Ahmed Prima Primary Care Jazmyn Offner: Grayland Ormond Other Clinician: Referring Scheryl Sanborn: FITZGERALD, DAVID Treating Jarren Para/Extender: STONE III, HOYT Weeks in Treatment: 50 Vital Signs Time Taken: 11:06 Temperature (F): 98.2 Height (in): 65 Pulse (bpm): 91 Weight (lbs): 154.3 Respiratory Rate (breaths/min): 18 Body Mass Index (BMI): 25.7 Blood Pressure (mmHg): 112/62 Reference Range: 80 - 120 mg / dl Electronic Signature(s) Signed: 07/26/2018 3:54:45 PM By: Alric Quan Entered By: Alric Quan on 07/26/2018 11:07:19

## 2018-08-04 ENCOUNTER — Other Ambulatory Visit: Payer: Self-pay

## 2018-08-04 ENCOUNTER — Observation Stay (HOSPITAL_COMMUNITY)
Admission: EM | Admit: 2018-08-04 | Discharge: 2018-08-05 | Disposition: A | Discharge status: Home / Self Care | Payer: Medicare Other | Attending: Internal Medicine | Admitting: Internal Medicine

## 2018-08-04 ENCOUNTER — Emergency Department (HOSPITAL_COMMUNITY): Payer: Medicare Other

## 2018-08-04 ENCOUNTER — Encounter (HOSPITAL_COMMUNITY): Payer: Self-pay | Admitting: Emergency Medicine

## 2018-08-04 DIAGNOSIS — Z7982 Long term (current) use of aspirin: Secondary | ICD-10-CM | POA: Insufficient documentation

## 2018-08-04 DIAGNOSIS — Z8673 Personal history of transient ischemic attack (TIA), and cerebral infarction without residual deficits: Secondary | ICD-10-CM | POA: Diagnosis not present

## 2018-08-04 DIAGNOSIS — I70202 Unspecified atherosclerosis of native arteries of extremities, left leg: Secondary | ICD-10-CM | POA: Diagnosis not present

## 2018-08-04 DIAGNOSIS — L97909 Non-pressure chronic ulcer of unspecified part of unspecified lower leg with unspecified severity: Secondary | ICD-10-CM

## 2018-08-04 DIAGNOSIS — L97319 Non-pressure chronic ulcer of right ankle with unspecified severity: Secondary | ICD-10-CM

## 2018-08-04 DIAGNOSIS — I878 Other specified disorders of veins: Secondary | ICD-10-CM | POA: Insufficient documentation

## 2018-08-04 DIAGNOSIS — Z883 Allergy status to other anti-infective agents status: Secondary | ICD-10-CM | POA: Diagnosis not present

## 2018-08-04 DIAGNOSIS — Z8679 Personal history of other diseases of the circulatory system: Secondary | ICD-10-CM | POA: Diagnosis not present

## 2018-08-04 DIAGNOSIS — I480 Paroxysmal atrial fibrillation: Secondary | ICD-10-CM | POA: Diagnosis not present

## 2018-08-04 DIAGNOSIS — I83009 Varicose veins of unspecified lower extremity with ulcer of unspecified site: Secondary | ICD-10-CM | POA: Diagnosis not present

## 2018-08-04 DIAGNOSIS — Z882 Allergy status to sulfonamides status: Secondary | ICD-10-CM

## 2018-08-04 DIAGNOSIS — Z95810 Presence of automatic (implantable) cardiac defibrillator: Secondary | ICD-10-CM

## 2018-08-04 DIAGNOSIS — I129 Hypertensive chronic kidney disease with stage 1 through stage 4 chronic kidney disease, or unspecified chronic kidney disease: Secondary | ICD-10-CM | POA: Insufficient documentation

## 2018-08-04 DIAGNOSIS — E785 Hyperlipidemia, unspecified: Secondary | ICD-10-CM | POA: Insufficient documentation

## 2018-08-04 DIAGNOSIS — D631 Anemia in chronic kidney disease: Secondary | ICD-10-CM | POA: Diagnosis not present

## 2018-08-04 DIAGNOSIS — G619 Inflammatory polyneuropathy, unspecified: Secondary | ICD-10-CM | POA: Insufficient documentation

## 2018-08-04 DIAGNOSIS — I83023 Varicose veins of left lower extremity with ulcer of ankle: Secondary | ICD-10-CM

## 2018-08-04 DIAGNOSIS — N183 Chronic kidney disease, stage 3 (moderate): Secondary | ICD-10-CM | POA: Insufficient documentation

## 2018-08-04 DIAGNOSIS — I7025 Atherosclerosis of native arteries of other extremities with ulceration: Secondary | ICD-10-CM | POA: Diagnosis present

## 2018-08-04 DIAGNOSIS — Z85828 Personal history of other malignant neoplasm of skin: Secondary | ICD-10-CM | POA: Diagnosis not present

## 2018-08-04 DIAGNOSIS — Z881 Allergy status to other antibiotic agents status: Secondary | ICD-10-CM

## 2018-08-04 DIAGNOSIS — I83013 Varicose veins of right lower extremity with ulcer of ankle: Secondary | ICD-10-CM | POA: Diagnosis not present

## 2018-08-04 DIAGNOSIS — L0390 Cellulitis, unspecified: Secondary | ICD-10-CM | POA: Diagnosis present

## 2018-08-04 DIAGNOSIS — R262 Difficulty in walking, not elsewhere classified: Secondary | ICD-10-CM | POA: Diagnosis not present

## 2018-08-04 DIAGNOSIS — L97329 Non-pressure chronic ulcer of left ankle with unspecified severity: Secondary | ICD-10-CM | POA: Diagnosis not present

## 2018-08-04 DIAGNOSIS — Z79899 Other long term (current) drug therapy: Secondary | ICD-10-CM | POA: Diagnosis not present

## 2018-08-04 DIAGNOSIS — Z888 Allergy status to other drugs, medicaments and biological substances status: Secondary | ICD-10-CM | POA: Diagnosis not present

## 2018-08-04 DIAGNOSIS — Z7901 Long term (current) use of anticoagulants: Secondary | ICD-10-CM | POA: Diagnosis not present

## 2018-08-04 DIAGNOSIS — L03116 Cellulitis of left lower limb: Secondary | ICD-10-CM | POA: Diagnosis not present

## 2018-08-04 DIAGNOSIS — I739 Peripheral vascular disease, unspecified: Secondary | ICD-10-CM | POA: Diagnosis present

## 2018-08-04 DIAGNOSIS — E039 Hypothyroidism, unspecified: Secondary | ICD-10-CM | POA: Diagnosis not present

## 2018-08-04 HISTORY — DX: Cerebral infarction, unspecified: I63.9

## 2018-08-04 HISTORY — DX: Hypothyroidism, unspecified: E03.9

## 2018-08-04 HISTORY — DX: Pneumonia, unspecified organism: J18.9

## 2018-08-04 HISTORY — DX: Presence of cardiac pacemaker: Z95.0

## 2018-08-04 LAB — CBC WITH DIFFERENTIAL/PLATELET
Abs Immature Granulocytes: 0.1 10*3/uL (ref 0.0–0.1)
BASOS PCT: 0 %
Basophils Absolute: 0 10*3/uL (ref 0.0–0.1)
EOS ABS: 0.4 10*3/uL (ref 0.0–0.7)
Eosinophils Relative: 4 %
HEMATOCRIT: 40.5 % (ref 36.0–46.0)
Hemoglobin: 12.2 g/dL (ref 12.0–15.0)
Immature Granulocytes: 1 %
Lymphocytes Relative: 10 %
Lymphs Abs: 1.1 10*3/uL (ref 0.7–4.0)
MCH: 28.1 pg (ref 26.0–34.0)
MCHC: 30.1 g/dL (ref 30.0–36.0)
MCV: 93.3 fL (ref 78.0–100.0)
MONO ABS: 1.1 10*3/uL — AB (ref 0.1–1.0)
MONOS PCT: 10 %
Neutro Abs: 8.5 10*3/uL — ABNORMAL HIGH (ref 1.7–7.7)
Neutrophils Relative %: 75 %
PLATELETS: 310 10*3/uL (ref 150–400)
RBC: 4.34 MIL/uL (ref 3.87–5.11)
RDW: 14.6 % (ref 11.5–15.5)
WBC: 11.2 10*3/uL — ABNORMAL HIGH (ref 4.0–10.5)

## 2018-08-04 LAB — BASIC METABOLIC PANEL
Anion gap: 8 (ref 5–15)
BUN: 24 mg/dL — AB (ref 8–23)
CALCIUM: 9.1 mg/dL (ref 8.9–10.3)
CO2: 25 mmol/L (ref 22–32)
CREATININE: 1.31 mg/dL — AB (ref 0.44–1.00)
Chloride: 106 mmol/L (ref 98–111)
GFR calc Af Amer: 40 mL/min — ABNORMAL LOW (ref >60)
GFR calc non Af Amer: 34 mL/min — ABNORMAL LOW (ref >60)
GLUCOSE: 133 mg/dL — AB (ref 70–99)
Potassium: 4.6 mmol/L (ref 3.5–5.1)
Sodium: 139 mmol/L (ref 135–145)

## 2018-08-04 LAB — SEDIMENTATION RATE: Sed Rate: 80 mm/hr — ABNORMAL HIGH (ref 0–22)

## 2018-08-04 LAB — C-REACTIVE PROTEIN: CRP: 8.1 mg/dL — AB (ref <1.0)

## 2018-08-04 MED ORDER — OCUVITE-LUTEIN PO CAPS
ORAL_CAPSULE | Freq: Every day | ORAL | Status: DC
  Filled 2018-08-04: qty 1

## 2018-08-04 MED ORDER — PANTOPRAZOLE SODIUM 40 MG PO TBEC
40.0000 mg | DELAYED_RELEASE_TABLET | Freq: Every day | ORAL | Status: DC
  Administered 2018-08-05: 40 mg via ORAL
  Filled 2018-08-04: qty 1

## 2018-08-04 MED ORDER — METOPROLOL TARTRATE 25 MG PO TABS
50.0000 mg | ORAL_TABLET | Freq: Two times a day (BID) | ORAL | Status: DC
  Administered 2018-08-04 – 2018-08-05 (×2): 50 mg via ORAL
  Filled 2018-08-04 (×2): qty 2

## 2018-08-04 MED ORDER — BIOTIN 1000 MCG PO TABS: 1000.0000 ug | ORAL_TABLET | Freq: Every day | ORAL | Status: DC

## 2018-08-04 MED ORDER — CEFAZOLIN SODIUM-DEXTROSE 1-4 GM/50ML-% IV SOLN
1.0000 g | Freq: Two times a day (BID) | INTRAVENOUS | Status: DC
  Administered 2018-08-04 – 2018-08-05 (×2): 1 g via INTRAVENOUS
  Filled 2018-08-04 (×3): qty 50

## 2018-08-04 MED ORDER — OXYBUTYNIN CHLORIDE ER 5 MG PO TB24
5.0000 mg | ORAL_TABLET | Freq: Every day | ORAL | Status: DC
  Administered 2018-08-04: 5 mg via ORAL
  Filled 2018-08-04 (×2): qty 1

## 2018-08-04 MED ORDER — CALCIUM CARBONATE 1250 (500 CA) MG PO TABS
500.0000 mg | ORAL_TABLET | Freq: Every day | ORAL | Status: DC
  Administered 2018-08-05: 500 mg via ORAL
  Filled 2018-08-04: qty 1

## 2018-08-04 MED ORDER — LEVOTHYROXINE SODIUM 50 MCG PO TABS
50.0000 ug | ORAL_TABLET | Freq: Every day | ORAL | Status: DC
  Administered 2018-08-05: 50 ug via ORAL
  Filled 2018-08-04: qty 1

## 2018-08-04 MED ORDER — VITAMIN D3 25 MCG (1000 UNIT) PO TABS
2000.0000 [IU] | ORAL_TABLET | Freq: Every day | ORAL | Status: DC
  Administered 2018-08-05: 2000 [IU] via ORAL
  Filled 2018-08-04 (×3): qty 2

## 2018-08-04 MED ORDER — ASPIRIN EC 81 MG PO TBEC
81.0000 mg | DELAYED_RELEASE_TABLET | Freq: Every day | ORAL | Status: DC
  Administered 2018-08-05: 81 mg via ORAL
  Filled 2018-08-04: qty 1

## 2018-08-04 MED ORDER — CEFAZOLIN SODIUM-DEXTROSE 1-4 GM/50ML-% IV SOLN
1.0000 g | Freq: Once | INTRAVENOUS | Status: AC
  Administered 2018-08-04: 1 g via INTRAVENOUS
  Filled 2018-08-04: qty 50

## 2018-08-04 MED ORDER — FUROSEMIDE 20 MG PO TABS
20.0000 mg | ORAL_TABLET | Freq: Every day | ORAL | Status: DC
  Administered 2018-08-05: 20 mg via ORAL
  Filled 2018-08-04: qty 1

## 2018-08-04 MED ORDER — HYDRALAZINE HCL 20 MG/ML IJ SOLN: 10.0000 mg | Freq: Four times a day (QID) | INTRAMUSCULAR | Status: DC | PRN

## 2018-08-04 MED ORDER — DABIGATRAN ETEXILATE MESYLATE 150 MG PO CAPS
150.0000 mg | ORAL_CAPSULE | Freq: Two times a day (BID) | ORAL | Status: DC
  Administered 2018-08-04 – 2018-08-05 (×2): 150 mg via ORAL
  Filled 2018-08-04 (×2): qty 1

## 2018-08-04 MED ORDER — DILTIAZEM HCL ER COATED BEADS 300 MG PO CP24
300.0000 mg | ORAL_CAPSULE | Freq: Every day | ORAL | Status: DC
  Administered 2018-08-04: 300 mg via ORAL
  Filled 2018-08-04: qty 1

## 2018-08-04 NOTE — Consult Note (Signed)
[place] Nurse wound consult note Reason for Consult:Bilateral lateral malleolar full thickness wounds, right present for >4 years, left for 4 months and with recent cellulitis. Seen by outpatient [facility] at [facility]. Next appointment is for Tuesday, 08/06/18 with Dr. Joaquim Lai. Patient wears Prevalon pressure redistribution heel boots for foot alignment at hs and for first half of night: she removed them to walk to the bathroom during the night and cannot reapply. Wound type: venous insufficiency  Pressure Injury POA: NA Measurement: Right 1.6cm x 1.5cm x 0.2cm. Red, dry wound bed. Left 2.0cm x 1.6cm x 0.4cm with pale pink wound bed, dry Wound bed:As described above. Wound bed staining is secondary to topical product being used by [facility], I.e. Cadexomer iodine (iodosorb) Drainage (amount, consistency, odor) None Periwound:intact with minor erythema in the immediate periwound of the LLE. Dry and intact on the right. Dressing procedure/placement/frequency: I will implement a conservative POC using silver hydrofiber (moistened with NS since wounds are dry) to the wound bed, and using mild compression as patient is in bed with LEs elevated.Heels to be floated while in bed.Orders provided for nursing.  Patient to follow up with the outpatient [facility] upon discharge.   [place] nursing team will not follow, but will remain available to this patient, the nursing and medical teams.  Please re-consult if needed.  Thanks, Maudie Flakes, MSN, RN, [place], Arther Abbott  Pager# [phone]

## 2018-08-04 NOTE — ED Notes (Signed)
Patient transported to X-ray 

## 2018-08-04 NOTE — ED Notes (Signed)
Pt ambulated with steady gait to bathroom.

## 2018-08-04 NOTE — H&P (Signed)
History and Physical  [name] HMC:[id number] DOB: 18-Jan-1923 DOA: 08/04/2018  Referring physician: Dr Caryl Asp  PCP: [name], PA-C  Outpatient Specialists: Wound Care, ID Patient coming from: Home  Chief Complaint: Worsening left ankle cellulitis  HPI: [name] is a 84 y.o. female with medical history significant for paroxysmal A. fib on Pradaxa, sick sinus syndrome status post AICD, hypertension, hyperlipidemia, hypothyroidism, peripheral neuropathy, who presented to ED [facility] with complaints of worsening left ankle cellulitis of 2 weeks duration.  Patient had been following up with wound care clinic and has been on p.o. antibiotics amoxicillin for the past 4 days with no improvement of her left ankle cellulitis.  She had a follow-up appointment with wound care clinic 2 days ago who recommended patient being seen in the ED if no improvement.  Patient also has a right ankle wound present for more than 4 years taken care of by the wound care clinic.  Denies any fevers or chills or nausea but admits to loose stools after starting amoxicillin.  Is scheduled to follow-up with ID clinic on 08/12/2018 with [name].  Ambulates with the assistance of a walker.  ED Course: Stable vital signs, unable to obtain MRI due to AICD.  Left ankle x-ray revealed difficult to exclude underlying early osteomyelitis in the setting of infection, lateral malleoli small ossified fragments may represent small avulsion fractures.  Started on IV antibiotics and admitted as observation status.  Will consult wound care specialist and ID.  Review of Systems: Review of systems as noted in the HPI. All other systems reviewed and are negative.   Past Medical History:  Diagnosis Date  . Peripheral vascular disease ([place])   . Varicose veins    Past Surgical History:  Procedure Laterality Date  . APPENDECTOMY    . BASAL CELL CARCINOMA EXCISION Left   . BREAST CYST EXCISION Left   . CATARACT EXTRACTION  Left   . CHOLECYSTECTOMY    . EYE SURGERY    . PACEMAKER PLACEMENT    . PERIPHERAL VASCULAR CATHETERIZATION Right 10/09/2016   Procedure: Lower Extremity Angiography;  Surgeon: Algernon Huxley, MD;  Location: [facility];  Service: Cardiovascular;  Laterality: Right;  . PERIPHERAL VASCULAR CATHETERIZATION  10/09/2016   Procedure: Lower Extremity Intervention;  Surgeon: Algernon Huxley, MD;  Location: [facility];  Service: Cardiovascular;;  . TONSILLECTOMY      Social History:  reports that she has never smoked. She has never used smokeless tobacco. She reports that she does not drink alcohol or use drugs.   Allergies  Allergen Reactions  . Chlorpheniramine     Other reaction(s): Unknown  . Fluconazole Rash  . Metronidazole Nausea And Vomiting, Other (See Comments) and Rash  . Monistat  [Tioconazole] Rash and Other (See Comments)  . Sulfa Antibiotics Rash and Nausea And Vomiting    Family History  Problem Relation Age of Onset  . Stroke Mother   . Cancer Father      Prior to Admission medications   Medication Sig Start Date End Date Taking? Authorizing Provider  acetaminophen (TYLENOL) 650 MG CR tablet Take 650 mg by mouth every 8 (eight) hours as needed for pain.    [provider]  aspirin EC 81 MG tablet Take 81 mg by mouth daily.    [provider]  Biotin 1000 MCG tablet Take 1,000 mcg by mouth daily.    [provider]  calcium carbonate (OSCAL) 1500 (600 Ca)  MG TABS tablet Take 600 mg of elemental calcium by mouth daily.    [provider]  CARTIA XT 300 MG 24 hr capsule Take 300 mg by mouth daily.  09/09/16   [provider]  Cholecalciferol (VITAMIN D) 2000 units CAPS Take 2,000 Units by mouth daily.    [provider]  dabigatran (PRADAXA) 150 MG CAPS capsule Take 150 mg by mouth 2 (two) times daily.    [provider]  fluticasone (FLONASE) 50 MCG/ACT nasal spray Place into the nose. 07/24/16    [provider]  furosemide (LASIX) 40 MG tablet Take 40 mg by mouth daily.    [provider]  Glucosamine 500 MG CAPS Take 500 mg by mouth daily.    [provider]  levothyroxine (SYNTHROID, LEVOTHROID) 50 MCG tablet Take 50 mcg by mouth daily before breakfast.  09/11/16   [provider]  losartan (COZAAR) 50 MG tablet Take 50 mg by mouth daily.    [provider]  magnesium oxide (MAG-OX) 400 MG tablet Take 400 mg by mouth daily.    [provider]  metoprolol (LOPRESSOR) 50 MG tablet Take 50 mg by mouth 2 (two) times daily. 09/22/16   [provider]  Multiple Vitamin (MULTIVITAMIN WITH MINERALS) TABS tablet Take 1 tablet by mouth daily.    [provider]  Multiple Vitamins-Minerals (PRESERVISION/LUTEIN PO) Take 1 capsule by mouth daily.    [provider]  mupirocin ointment (BACTROBAN) 2 % Apply to wound after soaking BID 05/23/17   Hyatt, Max T, DPM  NONFORMULARY OR COMPOUNDED ITEM See pharmacy note 05/23/17   Hyatt, Max T, DPM  omeprazole (PRILOSEC) 20 MG capsule Take by mouth daily.  09/09/16   [provider]  oseltamivir (TAMIFLU) 75 MG capsule  01/22/17   [provider]  potassium chloride SA (K-DUR,KLOR-CON) 20 MEQ tablet Take 20 mEq by mouth daily. 08/18/16   [provider]  predniSONE (DELTASONE) 50 MG tablet Take 1 tablet (50 mg total) by mouth daily with breakfast. 07/17/18   Cuthriell, Charline Bills, PA-C  rosuvastatin (CRESTOR) 5 MG tablet Take 5 mg by mouth every Monday, Wednesday, and Friday. 08/02/16   [provider]  TAZTIA XT 300 MG 24 hr capsule  03/15/18   [provider]  tolterodine (DETROL) 2 MG tablet Take 2 mg by mouth daily. 07/28/16   [provider]  traMADol (ULTRAM) 50 MG tablet Take 50 mg by mouth every 6 (six) hours as needed for pain. 09/16/16   [provider]  vitamin B-12 (CYANOCOBALAMIN) 1000 MCG tablet Take 1,000 mcg by  mouth daily.    [provider]    Physical Exam: BP 122/63 (BP Location: Left Arm)   Pulse 62   Temp 98.8 F (37.1 C) (Oral)   Resp 16   SpO2 97%   . General: 84 y.o. year-old female well developed well nourished in no acute distress.  Alert and oriented x3. . Cardiovascular: Regular rate and rhythm with no rubs or gallops.  No thyromegaly or JVD noted.  No lower extremity edema. 2/4 pulses in all 4 extremities. Marland Kitchen Respiratory: Clear to auscultation with no wheezes or rales. Good inspiratory effort. . Abdomen: Soft nontender nondistended with normal bowel sounds x4 quadrants. . Muskuloskeletal: No cyanosis, clubbing or edema noted bilaterally . Neuro: CN II-XII intact, strength, sensation, reflexes . Skin: Left ankle lateral side cellulitis/wound with erythema and warmth; also noted right ankle lateral side wound. Marland Kitchen Psychiatry: Judgement  and insight appear normal. Mood is appropriate for condition and setting          Labs on Admission:  Basic Metabolic Panel: Recent Labs  Lab 08/04/18 1045  NA 139  K 4.6  CL 106  CO2 25  GLUCOSE 133*  BUN 24*  CREATININE 1.31*  CALCIUM 9.1   Liver Function Tests: No results for input(s): AST, ALT, ALKPHOS, BILITOT, PROT, ALBUMIN in the last 168 hours. No results for input(s): LIPASE, AMYLASE in the last 168 hours. No results for input(s): AMMONIA in the last 168 hours. CBC: Recent Labs  Lab 08/04/18 1045  WBC 11.2*  NEUTROABS 8.5*  HGB 12.2  HCT 40.5  MCV 93.3  PLT 310   Cardiac Enzymes: No results for input(s): CKTOTAL, CKMB, CKMBINDEX, TROPONINI in the last 168 hours.  BNP (last 3 results) No results for input(s): BNP in the last 8760 hours.  ProBNP (last 3 results) No results for input(s): PROBNP in the last 8760 hours.  CBG: No results for input(s): GLUCAP in the last 168 hours.  Radiological Exams on Admission: Dg Ankle Complete Left  Result Date: 08/04/2018 CLINICAL DATA:  Lateral ankle wound, soft  tissue infection EXAM: LEFT ANKLE COMPLETE - 3+ VIEW COMPARISON:  07/12/2018 FINDINGS: Interval improvement in the lateral ankle soft tissue swelling. Small ossified fragments along the lateral malleolus may represent small avulsion type fractures. Difficult to exclude underlying early osteomyelitis in the setting of infection. Left distal tibia, talus and calcaneus appear intact. Peripheral atherosclerosis noted. IMPRESSION: Lateral malleolar small ossified fragments may represent small avulsion fractures. Improvement in the soft tissue swelling. See above comment. Peripheral atherosclerosis Electronically Signed   By: Jerilynn Mages.  Shick M.D.   On: 08/04/2018 11:01    EKG: I independently viewed the EKG done and my findings are as followed: None available at the time of this dictation.  Assessment/Plan Present on Admission: . Cellulitis  Active Problems:   Cellulitis  Left ankle lateral cellulitis with suspicion for osteomyelitis Failed outpatient treatment with p.o. amoxicillin taken for 4 days Start IV Ancef Obtain blood cultures peripherally x2 Obtain sed rate and CRP Monitor fever curve Monitor WBC Obtain CBC in the morning Consult wound care specialist Consult ID Has an initial appointment with ID on 08/12/2018 with [name]  Peripheral neuropathy Continue home medications Fall precautions  Paroxysmal A. fib Rate controlled Continue Pradaxa for CVA prophylaxis Continue Lopressor twice daily for rate control  History of sick sinus syndrome post AICD Appears stable  Hyperlipidemia Continue Crestor  Hypothyroidism Continue levothyroxine  Ambulatory dysfunction Fall precautions PT to assess tomorrow 08/05/2018  Hypertension Blood pressure stable Continue home antihypertensive medications IV hydralazine 10 mg q 6 H as needed blood pressure greater than 660 or diastolic blood pressure: 630 Monitor vital signs  CKD 3 Baseline creatinine 1.1 with GFR of 41 Creatinine on  presentation 1.31 Avoid nephrotoxic agent/dehydration/hypotension Obtain BMP in the morning    DVT prophylaxis: Pradaxa  Code Status: DO NOT INTUBATE  Family Communication: Daughter at bedside  Disposition Plan: MedSurg  Consults called: Wound care specialist, ID  Admission status: Observation status    Kayleen Memos MD [facility] Pager [phone]  If 7PM-7AM, please contact night-coverage [email or web address] Password [facility]  08/04/2018, 12:44 PM

## 2018-08-04 NOTE — Consult Note (Signed)
[facility] for Infectious Disease    Date of Admission:  08/04/2018    Total days of antibiotics 5        Day 1 cefazolin              Reason for Consult: Acute infection of chronic leg ulcer    Referring Provider: Dr. Francia Greaves  Assessment: She has chronic venous stasis ulcers and has some periwound cellulitis on her left ankle.  I do not see convincing evidence of osteomyelitis.  She could not undergo an MRI because of her AICD.  I discussed treatment options with them and it does not appear that IV antibiotic therapy would be very easy for them.  Her daughter would have to visit her assisted living facility for each dose or hire a nurse to administer her antibiotics.  I do not feel her current situation warrants IV antibiotics anyway.  I will meet with them again tomorrow and recommend discharge on oral amoxicillin clavulanate with follow-up in my clinic.  Plan: 1. Continue cefazolin for now but consider discharge tomorrow on oral amoxicillin clavulanate  Active Problems:   Varicose veins of lower extremities with ulcer ([place])   Atherosclerosis of native arteries of the extremities with ulceration ([facility])   Cellulitis   Acquired hypothyroidism   HLD (hyperlipidemia)   Inflammatory neuropathy ([facility])   AF (paroxysmal atrial fibrillation) ([facility])   PVD (peripheral vascular disease) ([place])   Scheduled Meds: . [START ON 08/05/2018] aspirin EC  81 mg Oral Daily  . [START ON 08/05/2018] calcium carbonate  500 mg of elemental calcium Oral Q breakfast  . cholecalciferol  2,000 Units Oral Daily  . dabigatran  150 mg Oral BID  . diltiazem  300 mg Oral QHS  . furosemide  20 mg Oral Daily  . [START ON 08/05/2018] levothyroxine  50 mcg Oral QAC breakfast  . metoprolol tartrate  50 mg Oral BID  . [START ON 08/05/2018] multivitamin-lutein   Oral Daily  . oxybutynin  5 mg Oral QHS  . [START ON 08/05/2018] pantoprazole  40 mg Oral Daily   Continuous Infusions: .  ceFAZolin (ANCEF) IV      PRN Meds:.hydrALAZINE  HPI: [name] is a 84 y.o. female with chronic ulcers on the lateral aspects of both ankles.  She has been followed at the wound center in [place].  It appears that these are probably venous stasis ulcers.  She underwent recent arterial studies and blood flow was felt to be adequate.  She was seen at the wound center on 07/19/2018 and again on 07/26/2018.  She and her daughter tell me that when her left ankle wound was debrided small pieces of bone were removed although I cannot find that documented in the notes.  Specimens were submitted for culture on 07/26/2018 and have grown MSSA.  She was started on amoxicillin 4 days ago.  Yesterday, when her nurse changed the dressing she commented that the area appeared more swollen and red.  They have been scheduled to see my partner, Dr. Talbot Grumbling, later this month but "panicked" and decided to come here to see if they can be evaluated sooner.  She has not had any fever, chills or sweats.  She has been tolerating her amoxicillin well.   Review of Systems: Review of Systems  Constitutional: Negative for chills, diaphoresis and fever.  Gastrointestinal: Negative for abdominal pain, diarrhea, nausea and vomiting.  Musculoskeletal: Positive for joint pain.  Past Medical History:  Diagnosis Date  . Cancer ([place])    skin cancer   . Hypothyroidism   . Peripheral vascular disease ([place])   . Pneumonia   . Presence of permanent cardiac pacemaker    2010  . Stroke ([place])   . Varicose veins     Social History   Tobacco Use  . Smoking status: Never Smoker  . Smokeless tobacco: Never Used  Substance Use Topics  . Alcohol use: No  . Drug use: No    Family History  Problem Relation Age of Onset  . Stroke Mother   . Cancer Father    Allergies  Allergen Reactions  . Chlorpheniramine     Other reaction(s): Unknown  . Fluconazole Rash  . Metronidazole Nausea And Vomiting, Other (See Comments) and Rash  . Monistat   [Tioconazole] Rash and Other (See Comments)  . Sulfa Antibiotics Rash and Nausea And Vomiting    OBJECTIVE: Blood pressure 116/62, pulse 64, temperature 98 F (36.7 C), temperature source Oral, resp. rate 16, height 5\' 6"  (1.676 m), weight 65.3 kg, SpO2 96 %.  Physical Exam  Constitutional:  She is resting comfortably in bed.  Her daughter is visiting.  She often defers to her daughter to answer or her daughter has to correct her answers.  Neurological: She is alert.  Psychiatric: She has a normal mood and affect.       Lab Results Lab Results  Component Value Date   WBC 11.2 (H) 08/04/2018   HGB 12.2 08/04/2018   HCT 40.5 08/04/2018   MCV 93.3 08/04/2018   PLT 310 08/04/2018    Lab Results  Component Value Date   CREATININE 1.31 (H) 08/04/2018   BUN 24 (H) 08/04/2018   NA 139 08/04/2018   K 4.6 08/04/2018   CL 106 08/04/2018   CO2 25 08/04/2018    Lab Results  Component Value Date   ALT 27 12/16/2012   AST 54 (H) 12/16/2012   ALKPHOS 127 12/16/2012   BILITOT 0.5 12/16/2012    Sed Rate (mm/hr)  Date Value  08/04/2018 80 (H)   CRP (mg/dL)  Date Value  08/04/2018 8.1 (H)   Microbiology: Recent Results (from the past 240 hour(s))  Aerobic Culture (superficial specimen)     Status: None   Collection Time: 07/26/18  3:15 PM  Result Value Ref Range Status   Specimen Description   Final    ANKLE Performed at [facility], [address] [place], [place] [address]    Special Requests   Final    NONE Performed at [facility], [address] [place], [place] [address]    Gram Stain   Final    RARE WBC PRESENT,BOTH PMN AND MONONUCLEAR NO ORGANISMS SEEN Performed at [facility], [place] [address] [place], [place] [address]    Culture FEW STAPHYLOCOCCUS AUREUS  Final   Report Status 07/29/2018 FINAL  Final   Organism ID, Bacteria STAPHYLOCOCCUS AUREUS  Final      Susceptibility   Staphylococcus aureus - MIC*    CIPROFLOXACIN  <=0.5 SENSITIVE Sensitive     ERYTHROMYCIN <=0.25 SENSITIVE Sensitive     GENTAMICIN <=0.5 SENSITIVE Sensitive     OXACILLIN 0.5 SENSITIVE Sensitive     TETRACYCLINE <=1 SENSITIVE Sensitive     VANCOMYCIN 1 SENSITIVE Sensitive     TRIMETH/SULFA <=10 SENSITIVE Sensitive     CLINDAMYCIN <=0.25 SENSITIVE Sensitive     RIFAMPIN <=0.5 SENSITIVE Sensitive  Inducible Clindamycin NEGATIVE Sensitive     * FEW STAPHYLOCOCCUS AUREUS   Left ankle x-ray 08/04/2018  FINDINGS: Interval improvement in the lateral ankle soft tissue swelling. Small ossified fragments along the lateral malleolus may represent small avulsion type fractures. Difficult to exclude underlying early osteomyelitis in the setting of infection. Left distal tibia, talus and calcaneus appear intact. Peripheral atherosclerosis noted.  IMPRESSION: Lateral malleolar small ossified fragments may represent small avulsion fractures. Improvement in the soft tissue swelling. See above comment.  Peripheral atherosclerosis   Electronically Signed   By: Jerilynn Mages.  Shick M.D.   On: 08/04/2018 11:01    Michel Bickers, [facility] [phone] pager   [phone] cell 08/04/2018, 4:12 PM

## 2018-08-04 NOTE — ED Provider Notes (Signed)
[facility] EMERGENCY DEPARTMENT Provider Note   CSN: [id number] Arrival date & time: 08/04/18  2202     History   Chief Complaint Chief Complaint  Patient presents with  . Wound Check    HPI [name] is a 84 y.o. female.  HPI   [name] is a 84 y.o. female, with a history of PVD, chronic wounds, A. fib, and CVA, presenting to the ED with a wound to the left lateral ankle.  She has had the wound for several months, however, about 3 weeks ago, patient noted worsening pain as well as redness spreading from the wound.  Patient had an x-ray of the left ankle on August 9 that showed an area questionable for osteomyelitis. August 16 she was seen by her wound management specialist, [name] Cos, [name]-C, with [name].  An MRI was ordered, but then canceled due to the patient's pacemaker.  She was placed on amoxicillin, which she started taking August 29. She has been referred to infectious disease and has an appointment with Dr. Linus [name] on September 9. Denies fever/chills, worsening pain, changes in sensation, weakness, N/V, or any other complaints.      Past Medical History:  Diagnosis Date  . Peripheral vascular disease ([place])   . Varicose veins     Patient Active Problem List   Diagnosis Date Noted  . Cellulitis 08/04/2018  . History of sick sinus syndrome   . Ambulatory dysfunction   . Atherosclerosis of native arteries of the extremities with ulceration ([place]) 07/23/2018  . Lymphedema 02/13/2017  . PVD (peripheral vascular disease) ([place]) 09/12/2016  . Lower limb ulcer, ankle, right, limited to breakdown of skin ([place]) 09/12/2016  . Varicose veins of lower extremities with ulcer ([place]) 09/12/2016  . Difficult or painful urination 05/10/2016  . Abnormal chest x-ray 04/07/2016  . Breath shortness 04/07/2016  . Allergic state 02/25/2016  . Back pain, chronic 02/25/2016  . HLD (hyperlipidemia) 02/25/2016  . Osteoporosis,  post-menopausal 02/25/2016  . Sick sinus syndrome ([place]) 02/25/2016  . Cerebrovascular accident (CVA) ([place]) 02/25/2016  . Inflammatory neuropathy ([place]) 11/19/2015  . Acquired hypothyroidism 05/04/2015  . H/O respiratory system disease 05/04/2015  . AF (paroxysmal atrial fibrillation) ([place]) 12/30/2014  . Finger injury 11/29/2014    Past Surgical History:  Procedure Laterality Date  . APPENDECTOMY    . BASAL CELL CARCINOMA EXCISION Left   . BREAST CYST EXCISION Left   . CATARACT EXTRACTION Left   . CHOLECYSTECTOMY    . EYE SURGERY    . PACEMAKER PLACEMENT    . PERIPHERAL VASCULAR CATHETERIZATION Right 10/09/2016   Procedure: Lower Extremity Angiography;  Surgeon: Algernon Huxley, MD;  Location: [facility];  Service: Cardiovascular;  Laterality: Right;  . PERIPHERAL VASCULAR CATHETERIZATION  10/09/2016   Procedure: Lower Extremity Intervention;  Surgeon: Algernon Huxley, MD;  Location: [facility];  Service: Cardiovascular;;  . TONSILLECTOMY       OB History   None      Home Medications    Prior to Admission medications   Medication Sig Start Date End Date Taking? Authorizing Provider  acetaminophen (TYLENOL) 650 MG CR tablet Take 650 mg by mouth every 8 (eight) hours as needed for pain.   Yes [provider]  aspirin EC 81 MG tablet Take 81 mg by mouth daily.   Yes [provider]  Biotin 1000 MCG tablet Take 1,000 mcg by mouth daily.   Yes [provider]  calcium carbonate (OSCAL) 1500 (600 Ca) MG TABS tablet Take 600 mg of elemental calcium by mouth daily.   Yes [provider]  CARTIA XT 300 MG 24 hr capsule Take 300 mg by mouth at bedtime.  09/09/16  Yes [provider]  Cholecalciferol (VITAMIN D) 2000 units CAPS Take 2,000 Units by mouth daily.   Yes [provider]  dabigatran (PRADAXA) 150 MG CAPS capsule Take 150 mg by mouth 2 (two) times daily.   Yes [provider]  furosemide (LASIX) 20 MG  tablet Take 20 mg by mouth daily.    Yes [provider]  levothyroxine (SYNTHROID, LEVOTHROID) 50 MCG tablet Take 50 mcg by mouth daily before breakfast.  09/11/16  Yes [provider]  losartan (COZAAR) 50 MG tablet Take 50 mg by mouth daily.   Yes [provider]  metoprolol (LOPRESSOR) 50 MG tablet Take 50 mg by mouth 2 (two) times daily. 09/22/16  Yes [provider]  Multiple Vitamins-Minerals (PRESERVISION/LUTEIN PO) Take 1 capsule by mouth daily.   Yes [provider]  omeprazole (PRILOSEC) 40 MG capsule Take 40 mg by mouth daily.  09/09/16  Yes [provider]  potassium chloride SA (K-DUR,KLOR-CON) 20 MEQ tablet Take 20 mEq by mouth daily. 08/18/16  Yes [provider]  tolterodine (DETROL) 2 MG tablet Take 2 mg by mouth daily as needed (bladder control).  07/28/16  Yes [provider]  traMADol (ULTRAM) 50 MG tablet Take 50 mg by mouth 2 (two) times daily.  09/16/16  Yes [provider]  NONFORMULARY OR COMPOUNDED ITEM See pharmacy note 05/23/17   [name], DPM    Family History Family History  Problem Relation Age of Onset  . Stroke Mother   . Cancer Father     Social History Social History   Tobacco Use  . Smoking status: Never Smoker  . Smokeless tobacco: Never Used  Substance Use Topics  . Alcohol use: No  . Drug use: No     Allergies   Chlorpheniramine; Fluconazole; Metronidazole; Monistat  [tioconazole]; and Sulfa antibiotics   Review of Systems Review of Systems  Constitutional: Negative for chills and fever.  Musculoskeletal: Negative for arthralgias and joint swelling.  Skin: Positive for wound.  Neurological: Negative for weakness and numbness.  All other systems reviewed and are negative.      Physical Exam Updated Vital Signs BP 114/60 (BP Location: Right Arm)   Pulse 63   Temp 98.8 F (37.1 C) (Oral)   Resp 20   SpO2 98%   Physical Exam  Constitutional: She  appears well-developed and well-nourished. No distress.  HENT:  Head: Normocephalic and atraumatic.  Eyes: Conjunctivae are normal.  Neck: Neck supple.  Cardiovascular: Normal rate, regular rhythm and intact distal pulses.  Pulses:      Dorsalis pedis pulses are 2+ on the right side, and 2+ on the left side.       Posterior tibial pulses are 2+ on the right side, and 2+ on the left side.  Pulmonary/Chest: Effort normal. No respiratory distress.  Abdominal: Soft. There is no tenderness. There is no guarding.  Musculoskeletal: She exhibits edema and tenderness.  Full range of motion in the left ankle.  No tenderness into the left calf.  Lymphadenopathy:    She has no cervical adenopathy.  Neurological: She is alert.  Motor function intact in the bilateral lower extremities.  Sensation to light touch grossly intact.  Skin: Skin  is warm and dry. Capillary refill takes less than 2 seconds. She is not diaphoretic. There is erythema.  Ulcerating wound to the left lateral malleolus with surrounding erythema and edema.  Somewhat tender to the touch.  Psychiatric: She has a normal mood and affect. Her behavior is normal.  Nursing note and vitals reviewed.        ED Treatments / Results  Labs (all labs ordered are listed, but only abnormal results are displayed) Labs Reviewed  BASIC METABOLIC PANEL - Abnormal; Notable for the following components:      Result Value   Glucose, Bld 133 (*)    BUN 24 (*)    Creatinine, Ser 1.31 (*)    GFR calc non Af Amer 34 (*)    GFR calc Af Amer 40 (*)    All other components within normal limits  CBC WITH DIFFERENTIAL/PLATELET - Abnormal; Notable for the following components:   WBC 11.2 (*)    Neutro Abs 8.5 (*)    Monocytes Absolute 1.1 (*)    All other components within normal limits  CULTURE, BLOOD (ROUTINE X 2)  CULTURE, BLOOD (ROUTINE X 2)  HIV ANTIBODY (ROUTINE TESTING)  SEDIMENTATION RATE  C-REACTIVE PROTEIN     EKG None  Radiology Dg Ankle Complete Left  Result Date: 08/04/2018 CLINICAL DATA:  Lateral ankle wound, soft tissue infection EXAM: LEFT ANKLE COMPLETE - 3+ VIEW COMPARISON:  07/12/2018 FINDINGS: Interval improvement in the lateral ankle soft tissue swelling. Small ossified fragments along the lateral malleolus may represent small avulsion type fractures. Difficult to exclude underlying early osteomyelitis in the setting of infection. Left distal tibia, talus and calcaneus appear intact. Peripheral atherosclerosis noted. IMPRESSION: Lateral malleolar small ossified fragments may represent small avulsion fractures. Improvement in the soft tissue swelling. See above comment. Peripheral atherosclerosis Electronically Signed   By: Jerilynn Mages.  Shick M.D.   On: 08/04/2018 11:01    Procedures Procedures (including critical care time)  Medications Ordered in ED Medications  ceFAZolin (ANCEF) IVPB 1 g/50 mL premix (1 g Intravenous New Bag/Given 08/04/18 1241)     Initial Impression / Assessment and Plan / ED Course  I have reviewed the triage vital signs and the nursing notes.  Pertinent labs & imaging results that were available during my care of the patient were reviewed by me and considered in my medical decision making (see chart for details).  Clinical Course as of Aug 04 1316  Sun Aug 04, 2018  1213 Spoke with hospitalist.   [SJ]    Clinical Course User Index [SJ] Lorayne Bender, [place]    Patient presents with worsening redness and edema surrounding a left ankle wound despite oral antibiotic therapy. Patient is nontoxic appearing, afebrile, not tachycardic, not tachypneic, not hypotensive, and is in no apparent distress.  Low suspicion for sepsis. Patient will need to be admitted for IV antibiotics due to failure of outpatient therapy.   Findings and plan of care discussed with Fredia Sorrow, MD.     Final Clinical Impressions(s) / ED Diagnoses   Final diagnoses:  Cellulitis of  left lower extremity    ED Discharge Orders    None       Layla Maw 08/04/18 1317    Fredia Sorrow, MD 08/04/18 [phone]

## 2018-08-04 NOTE — ED Notes (Signed)
Pt returned from xray

## 2018-08-04 NOTE — Progress Notes (Signed)
Paged [facility] RN in regards to new dressing and silver hydrfrofiber. Will follow up after communication with [facility] RN, area outlined with skin marker per order.

## 2018-08-04 NOTE — ED Notes (Signed)
Pt ambulated to bathroom with 1x assist, states that as home she is independent with walker.

## 2018-08-04 NOTE — ED Triage Notes (Signed)
Pt arrives with daughter for evaluation of wounds to the right and left ankle. The right one she has had for 3 years, no redness noted. The wound to the left ankle has serosanguinous drainage and has surrounding redness up into the ankle for about 3 months. She has been on amoxicillin and has a follow up appointment scheduled.

## 2018-08-05 DIAGNOSIS — E039 Hypothyroidism, unspecified: Secondary | ICD-10-CM | POA: Diagnosis not present

## 2018-08-05 DIAGNOSIS — I739 Peripheral vascular disease, unspecified: Secondary | ICD-10-CM

## 2018-08-05 DIAGNOSIS — L03116 Cellulitis of left lower limb: Secondary | ICD-10-CM | POA: Diagnosis not present

## 2018-08-05 DIAGNOSIS — L97329 Non-pressure chronic ulcer of left ankle with unspecified severity: Secondary | ICD-10-CM | POA: Diagnosis not present

## 2018-08-05 DIAGNOSIS — Z881 Allergy status to other antibiotic agents status: Secondary | ICD-10-CM | POA: Diagnosis not present

## 2018-08-05 DIAGNOSIS — I83023 Varicose veins of left lower extremity with ulcer of ankle: Secondary | ICD-10-CM | POA: Diagnosis not present

## 2018-08-05 LAB — COMPREHENSIVE METABOLIC PANEL
ALT: 12 U/L (ref 0–44)
AST: 16 U/L (ref 15–41)
Albumin: 2.3 g/dL — ABNORMAL LOW (ref 3.5–5.0)
Alkaline Phosphatase: 86 U/L (ref 38–126)
Anion gap: 7 (ref 5–15)
BILIRUBIN TOTAL: 0.5 mg/dL (ref 0.3–1.2)
BUN: 17 mg/dL (ref 8–23)
CO2: 25 mmol/L (ref 22–32)
Calcium: 8.5 mg/dL — ABNORMAL LOW (ref 8.9–10.3)
Chloride: 107 mmol/L (ref 98–111)
Creatinine, Ser: 0.89 mg/dL (ref 0.44–1.00)
GFR calc Af Amer: >60 mL/min (ref >60)
GFR, EST NON AFRICAN AMERICAN: 55 mL/min — AB (ref >60)
Glucose, Bld: 89 mg/dL (ref 70–99)
POTASSIUM: 4.1 mmol/L (ref 3.5–5.1)
Sodium: 139 mmol/L (ref 135–145)
TOTAL PROTEIN: 6.5 g/dL (ref 6.5–8.1)

## 2018-08-05 LAB — CBC
HCT: 35.6 % — ABNORMAL LOW (ref 36.0–46.0)
Hemoglobin: 10.7 g/dL — ABNORMAL LOW (ref 12.0–15.0)
MCH: 27.6 pg (ref 26.0–34.0)
MCHC: 30.1 g/dL (ref 30.0–36.0)
MCV: 92 fL (ref 78.0–100.0)
Platelets: 306 10*3/uL (ref 150–400)
RBC: 3.87 MIL/uL (ref 3.87–5.11)
RDW: 14.6 % (ref 11.5–15.5)
WBC: 7.9 10*3/uL (ref 4.0–10.5)

## 2018-08-05 LAB — HEMOGLOBIN AND HEMATOCRIT, BLOOD
HCT: 35.4 % — ABNORMAL LOW (ref 36.0–46.0)
Hemoglobin: 10.9 g/dL — ABNORMAL LOW (ref 12.0–15.0)

## 2018-08-05 LAB — IRON AND TIBC
Iron: 50 ug/dL (ref 28–170)
Saturation Ratios: 16 % (ref 10.4–31.8)
TIBC: 309 ug/dL (ref 250–450)
UIBC: 259 ug/dL

## 2018-08-05 LAB — FERRITIN: Ferritin: 39 ng/mL (ref 11–307)

## 2018-08-05 LAB — MAGNESIUM: MAGNESIUM: 2.1 mg/dL (ref 1.7–2.4)

## 2018-08-05 MED ORDER — CEPHALEXIN 500 MG PO CAPS: 500.0000 mg | ORAL_CAPSULE | Freq: Three times a day (TID) | ORAL | 0 refills | Status: DC

## 2018-08-05 MED ORDER — PROSIGHT PO TABS
1.0000 | ORAL_TABLET | Freq: Every day | ORAL | Status: DC
  Administered 2018-08-05: 1 via ORAL
  Filled 2018-08-05: qty 1

## 2018-08-05 MED ORDER — CEPHALEXIN 500 MG PO CAPS: 500.0000 mg | ORAL_CAPSULE | Freq: Three times a day (TID) | ORAL | Status: DC

## 2018-08-05 NOTE — Progress Notes (Signed)
Physical Therapy Evaluation Patient Details Name: [name] MRN: [id number] DOB: 07-21-1923 Today's Date: 08/05/2018   History of Present Illness  Pt is a 84 y.o. female presenting with cellulitis. PMH is significant for paroxysmal A. fib, sick sinus syndrome, HTN, hyperlipidemia, hypothyroidism, and peripheral neuropathy.   Clinical Impression  Pt admitted with above diagnosis. Pt currently with deficits listed below (see PT Problem List). At time of evaluation pt ambulated with gross min guard, with a RW for safety. Pt reports mild SOB at the end of the session and mild weakness confirmed by MMT, however daughter confirms generalized weakness at baseline. BP was 129/66mmHg at the end of session. Pt educated on generalized LE exercise program, positioning, and overall safety with mobility. Recommending HHPT at d/c to improve generalized weakness and increase aerobic endurance. Pt will benefit from skilled PT to increase their independence and safety with mobility to allow discharge to the venue listed below.       Follow Up Recommendations Home health PT;Supervision for mobility/OOB    Equipment Recommendations  None recommended by PT    Recommendations for Other Services       Precautions / Restrictions Precautions Precautions: Fall Restrictions Weight Bearing Restrictions: No      Mobility  Bed Mobility Overal bed mobility: Needs Assistance Bed Mobility: Supine to Sit     Supine to sit: Min guard     General bed mobility comments: HOB elevated to simulate 2-3 pillows that pt reports she usually sleeps with; VCs required for safety and technique; pt insists on using RW to pull up from supine>sitting, as she does at home. Educated pt on safety with use of such technique due to unsteadiness with RW.  Transfers Overall transfer level: Needs assistance Equipment used: Rolling walker (2 wheeled) Transfers: Sit to/from Stand Sit to Stand: Min guard         General  transfer comment: Pt requires increased time and effort for sit<>stand; min cues for hand placement; pt min g for safety; Bed height elevated to simulate home environment. At the end of session pt's BP was taken seated in chair and was 129/61 mmHg.   Ambulation/Gait Ambulation/Gait assistance: Min guard Gait Distance (Feet): 300 Feet Assistive device: Rolling walker (2 wheeled) Gait Pattern/deviations: Step-through pattern;Decreased stance time - left;Trunk flexed Gait velocity: decreased Gait velocity interpretation: <1.31 ft/sec, indicative of household ambulator General Gait Details: Pt initially slow and guarded; Min cues for pt to remain within RW, upright posture, and eyes forward; min g for safety   Stairs            Wheelchair Mobility    Modified Rankin (Stroke Patients Only)       Balance Overall balance assessment: Needs assistance Sitting-balance support: Feet supported;No upper extremity supported Sitting balance-Leahy Scale: Good     Standing balance support: Bilateral upper extremity supported;During functional activity Standing balance-Leahy Scale: Poor Standing balance comment: Bil UE support required for static and dynamic standing activities                             Pertinent Vitals/Pain Pain Assessment: Faces Faces Pain Scale: Hurts a little bit Pain Location: L foot  Pain Descriptors / Indicators: Burning;Tingling Pain Intervention(s): Limited activity within patient's tolerance;Monitored during session;Repositioned    Home Living Family/patient expects to be discharged to:: Other (Comment)(Independent living facility )  Additional Comments: Has some nursing assistance available if pt/family willing to pay for it. Otherwise has a life alert.    Prior Function Level of Independence: Independent with assistive device(s)         Comments: Using RW all the time. 200+ feet from room to the dining hall      Hand Dominance   Dominant Hand: Right    Extremity/Trunk Assessment   Upper Extremity Assessment Upper Extremity Assessment: Generalized weakness    Lower Extremity Assessment Lower Extremity Assessment: RLE deficits/detail;LLE deficits/detail RLE Deficits / Details: Chronic ulcer on R foot. MMT demonstrates gross 4/5 on RLE.  RLE Sensation: decreased light touch;history of peripheral neuropathy LLE Deficits / Details: Cellulitis on L ankle. MMT demonstrates gross 4-/5 on LLE.  LLE Sensation: decreased light touch;history of peripheral neuropathy    Cervical / Trunk Assessment Cervical / Trunk Assessment: Kyphotic  Communication   Communication: No difficulties  Cognition Arousal/Alertness: Awake/alert Behavior During Therapy: WFL for tasks assessed/performed Overall Cognitive Status: Within Functional Limits for tasks assessed                                 General Comments: Pt's daughter answered any questions that the pt could not       General Comments General comments (skin integrity, edema, etc.): Pt's daughter present and engaged throughout session    Exercises General Exercises - Lower Extremity Long Arc Quad: AROM;Strengthening;Both;10 reps;Seated Hip Flexion/Marching: AROM;Strengthening;Both;10 reps;Seated   Assessment/Plan    PT Assessment Patient needs continued PT services  PT Problem List Decreased strength;Decreased range of motion;Decreased activity tolerance;Decreased balance;Decreased mobility;Decreased coordination;Decreased knowledge of use of DME;Decreased safety awareness;Pain       PT Treatment Interventions DME instruction;Gait training;Therapeutic activities;Functional mobility training;Therapeutic exercise;Balance training;Patient/family education;Modalities    PT Goals (Current goals can be found in the Care Plan section)  Acute Rehab PT Goals Patient Stated Goal: to go back to independent living PT Goal Formulation:  With patient Time For Goal Achievement: 08/12/18 Potential to Achieve Goals: Good    Frequency Min 3X/week   Barriers to discharge        Co-evaluation               AM-PAC PT "6 Clicks" Daily Activity  Outcome Measure Difficulty turning over in bed (including adjusting bedclothes, sheets and blankets)?: Unable Difficulty moving from lying on back to sitting on the side of the bed? : Unable Difficulty sitting down on and standing up from a chair with arms (e.g., wheelchair, bedside commode, etc,.)?: Unable Help needed moving to and from a bed to chair (including a wheelchair)?: A Little Help needed walking in hospital room?: A Little Help needed climbing 3-5 steps with a railing? : A Lot 6 Click Score: 11    End of Session Equipment Utilized During Treatment: Gait belt Activity Tolerance: Patient tolerated treatment well Patient left: in chair;with call bell/phone within reach;with family/visitor present Nurse Communication: Mobility status PT Visit Diagnosis: Other abnormalities of gait and mobility (R26.89);Muscle weakness (generalized) (M62.81);Pain Pain - Right/Left: Left Pain - part of body: Ankle and joints of foot    Time: 4270-6237 PT Time Calculation (min) (ACUTE ONLY): 34 min   Charges:   PT Evaluation $PT Eval Moderate Complexity: 1 Mod PT Treatments $Gait Training: 8-22 mins        Einar Crow, [place]  Student Physical Therapist Acute Rehab [phone]   Einar Crow 08/05/2018, 2:33 PM

## 2018-08-05 NOTE — Discharge Summary (Addendum)
Physician Discharge Summary  [name] ASN:[id number] DOB: August 05, 1923 DOA: 08/04/2018  PCP: [name] Rossetti, PA-C  Admit date: 08/04/2018 Discharge date: 08/05/2018  Admitted From: Independent living facility Discharge disposition: medically stable for d/c back to independent living facility   Recommendations for Outpatient Follow-Up:   1. CBC later this week per PCP to f/u hgb- suspect dropped from dilutional component 2. ID, [name] 08/12/18 3.  Home health 4.  Close monitoring of BP via [place] RN   Discharge Diagnosis:   Principal Problem:   Cellulitis Active Problems:   Acquired hypothyroidism   HLD (hyperlipidemia)   Inflammatory neuropathy (HCC)   AF (paroxysmal atrial fibrillation) (HCC)   PVD (peripheral vascular disease) (HCC)   Varicose veins of lower extremities with ulcer (HCC)   Atherosclerosis of native arteries of the extremities with ulceration ([place])    Discharge Condition: Improved.  Diet recommendation: Low sodium, heart healthy.  Carbohydrate-modified.  Regular.  Wound care: None.  Code status: Full.   History of Present Illness:   84yo with pmh pafib on Pradaxa, SSS s/p AICD, hypertension, hyperlipidemia, hypothyroidism, peripheral neuropathy presented to ED with worsening left ankle cellulitis. Pt is long-time pt of wound care clinic in [place], where she was put on amoxicillin 4 days ago for left ankle cellulitis. Daughter was concerned for worsening erythema prompting ED visit. Pt denied any fever or chills. She has had some loose still since starting amoxicillin, but not significant amount at this time. Started on Cefazolin in ED. Seen in consultation by ID.   Hospital Course by Problem:   1. Chronic venous stasis ulcers with periwound cellulitis on left ankle -no evidence of osteomyelitis on xray. Unable to obtain MRI with AICD -Pt afebrile, WBC 11.2>>7.9, blood cx x 2 NGTD, afebrile. Wound cx 8/23 +staph -Appreciate ID  assistance. Medically stable for d/c home on Keflex 500 TID per ID recs -f/u Dr [name] as previous arranged.   -Continue home health PT  2. Anemia -Hgb 12.2>>10.7>>10.9. Suspect dilutional component a/w chronic dz. No evidence of ABL on exam -f/u PCP this week for recheck  3. CKD, stage III -stable Cr 1.31>>0.89  4. Hypertension -good control while inpt -continue BB, ARB  5. Hx afib  -rate controlled -continue diltiazem, pradaxa   6. Hyperlipidemia -continue statin  7. Hypothyroidism -continue levothyroxine    Medical Consultants:   Infectious disease   Discharge Exam:   Vitals:   08/05/18 0900 08/05/18 1331  BP: (!) 119/58 139/90  Pulse: 71 80  Resp: 16   Temp: 98.6 F (37 C) 98 F (36.7 C)  SpO2: 95% 96%   Vitals:   08/05/18 0006 08/05/18 0435 08/05/18 0900 08/05/18 1331  BP: 122/64 120/71 (!) 119/58 139/90  Pulse: 70 60 71 80  Resp: 16 18 16    Temp: 98.5 F (36.9 C) 97.6 F (36.4 C) 98.6 F (37 C) 98 F (36.7 C)  TempSrc: Oral Oral Oral Oral  SpO2: 95% 95% 95% 96%  Weight:  68.7 kg    Height:        General exam: Appears calm and comfortable. Sitting upright in bed eating Respiratory system: Clear to auscultation. Respiratory effort normal. Cardiovascular system: S1 & S2 heard, RRR. No JVD,  rubs, gallops or clicks. No murmurs. Gastrointestinal system: Abdomen is nondistended, soft and nontender. No organomegaly or masses felt. Normal bowel sounds heard. Central nervous system: Alert and oriented. No focal neurological deficits. Skin: bilateral heel boot. Chronic ulcer left lateral  malleolus with mild surrounding erythema. No purulence.  Psychiatry: Judgement and insight appear normal. Mood & affect appropriate.    The results of significant diagnostics from this hospitalization (including imaging, microbiology, ancillary and laboratory) are listed below for reference.     Procedures and Diagnostic Studies:   Dg Ankle Complete Left  Result  Date: 08/04/2018 CLINICAL DATA:  Lateral ankle wound, soft tissue infection EXAM: LEFT ANKLE COMPLETE - 3+ VIEW COMPARISON:  07/12/2018 FINDINGS: Interval improvement in the lateral ankle soft tissue swelling. Small ossified fragments along the lateral malleolus may represent small avulsion type fractures. Difficult to exclude underlying early osteomyelitis in the setting of infection. Left distal tibia, talus and calcaneus appear intact. Peripheral atherosclerosis noted. IMPRESSION: Lateral malleolar small ossified fragments may represent small avulsion fractures. Improvement in the soft tissue swelling. See above comment. Peripheral atherosclerosis Electronically Signed   By: Jerilynn Mages.  Shick M.D.   On: 08/04/2018 11:01     Labs:   Basic Metabolic Panel: Recent Labs  Lab 08/04/18 1045 08/05/18 0520  NA 139 139  K 4.6 4.1  CL 106 107  CO2 25 25  GLUCOSE 133* 89  BUN 24* 17  CREATININE 1.31* 0.89  CALCIUM 9.1 8.5*  MG  --  2.1   GFR Estimated Creatinine Clearance: 37.8 mL/min (by C-G formula based on SCr of 0.89 mg/dL). Liver Function Tests: Recent Labs  Lab 08/05/18 0520  AST 16  ALT 12  ALKPHOS 86  BILITOT 0.5  PROT 6.5  ALBUMIN 2.3*   No results for input(s): LIPASE, AMYLASE in the last 168 hours. No results for input(s): AMMONIA in the last 168 hours. Coagulation profile No results for input(s): INR, PROTIME in the last 168 hours.  CBC: Recent Labs  Lab 08/04/18 1045 08/05/18 0520 08/05/18 1156  WBC 11.2* 7.9  --   NEUTROABS 8.5*  --   --   HGB 12.2 10.7* 10.9*  HCT 40.5 35.6* 35.4*  MCV 93.3 92.0  --   PLT 310 306  --    Cardiac Enzymes: No results for input(s): CKTOTAL, CKMB, CKMBINDEX, TROPONINI in the last 168 hours. BNP: Invalid input(s): POCBNP CBG: No results for input(s): GLUCAP in the last 168 hours. D-Dimer No results for input(s): DDIMER in the last 72 hours. Hgb A1c No results for input(s): HGBA1C in the last 72 hours. Lipid Profile No results  for input(s): CHOL, HDL, LDLCALC, TRIG, CHOLHDL, LDLDIRECT in the last 72 hours. Thyroid function studies No results for input(s): TSH, T4TOTAL, T3FREE, THYROIDAB in the last 72 hours.  Invalid input(s): FREET3 Anemia work up Recent Labs    08/05/18 1156  FERRITIN 39  TIBC 309  IRON 50   Microbiology Recent Results (from the past 240 hour(s))  Aerobic Culture (superficial specimen)     Status: None   Collection Time: 07/26/18  3:15 PM  Result Value Ref Range Status   Specimen Description   Final    ANKLE Performed at [facility], [address] [place], [place] [address]    Special Requests   Final    NONE Performed at [facility], [address] [place], [place] [address]    Gram Stain   Final    RARE WBC PRESENT,BOTH PMN AND MONONUCLEAR NO ORGANISMS SEEN Performed at [facility], [place] [address] [place], [place] [address]    Culture FEW STAPHYLOCOCCUS AUREUS  Final   Report Status 07/29/2018 FINAL  Final   Organism ID, Bacteria STAPHYLOCOCCUS AUREUS  Final  Susceptibility   Staphylococcus aureus - MIC*    CIPROFLOXACIN <=0.5 SENSITIVE Sensitive     ERYTHROMYCIN <=0.25 SENSITIVE Sensitive     GENTAMICIN <=0.5 SENSITIVE Sensitive     OXACILLIN 0.5 SENSITIVE Sensitive     TETRACYCLINE <=1 SENSITIVE Sensitive     VANCOMYCIN 1 SENSITIVE Sensitive     TRIMETH/SULFA <=10 SENSITIVE Sensitive     CLINDAMYCIN <=0.25 SENSITIVE Sensitive     RIFAMPIN <=0.5 SENSITIVE Sensitive     Inducible Clindamycin NEGATIVE Sensitive     * FEW STAPHYLOCOCCUS AUREUS  Culture, blood (routine x 2)     Status: None (Preliminary result)   Collection Time: 08/04/18  1:47 PM  Result Value Ref Range Status   Specimen Description BLOOD LEFT ANTECUBITAL  Final   Special Requests   Final    BOTTLES DRAWN AEROBIC AND ANAEROBIC Blood Culture adequate volume   Culture   Final    NO GROWTH < 24 HOURS Performed at [facility], [address]  [place], [place] [address]    Report Status PENDING  Incomplete  Culture, blood (routine x 2)     Status: None (Preliminary result)   Collection Time: 08/04/18  1:57 PM  Result Value Ref Range Status   Specimen Description BLOOD BLOOD LEFT HAND  Final   Special Requests   Final    BOTTLES DRAWN AEROBIC AND ANAEROBIC Blood Culture adequate volume   Culture   Final    NO GROWTH < 24 HOURS Performed at [facility], [place] [address], [place], [place] [address]    Report Status PENDING  Incomplete     Discharge Instructions:   Discharge Instructions    Diet - low sodium heart healthy   Complete by:  As directed    Increase activity slowly   Complete by:  As directed      Allergies as of 08/05/2018      Reactions   Chlorpheniramine    Other reaction(s): Unknown   Fluconazole Rash   Metronidazole Nausea And Vomiting, Other (See Comments), Rash   Monistat  [tioconazole] Rash, Other (See Comments)   Sulfa Antibiotics Rash, Nausea And Vomiting      Medication List    STOP taking these medications   losartan 50 MG tablet Commonly known as:  COZAAR     TAKE these medications   acetaminophen 650 MG CR tablet Commonly known as:  TYLENOL Take 650 mg by mouth every 8 (eight) hours as needed for pain.   aspirin EC 81 MG tablet Take 81 mg by mouth daily.   Biotin 1000 MCG tablet Take 1,000 mcg by mouth daily.   calcium carbonate 1500 (600 Ca) MG Tabs tablet Commonly known as:  OSCAL Take 600 mg of elemental calcium by mouth daily.   CARTIA XT 300 MG 24 hr capsule Generic drug:  diltiazem Take 300 mg by mouth at bedtime.   cephALEXin 500 MG capsule Commonly known as:  KEFLEX Take 1 capsule (500 mg total) by mouth every 8 (eight) hours.   dabigatran 150 MG Caps capsule Commonly known as:  PRADAXA Take 150 mg by mouth 2 (two) times daily.   furosemide 20 MG tablet Commonly known as:  LASIX Take 20 mg by mouth daily.   levothyroxine 50 MCG tablet Commonly known as:   SYNTHROID, LEVOTHROID Take 50 mcg by mouth daily before breakfast.   metoprolol tartrate 50 MG tablet Commonly known as:  LOPRESSOR Take 50 mg by mouth 2 (two) times  daily.   NONFORMULARY OR COMPOUNDED ITEM See pharmacy note   omeprazole 40 MG capsule Commonly known as:  PRILOSEC Take 40 mg by mouth daily.   potassium chloride SA 20 MEQ tablet Commonly known as:  K-DUR,KLOR-CON Take 20 mEq by mouth daily.   PRESERVISION/LUTEIN PO Take 1 capsule by mouth daily.   tolterodine 2 MG tablet Commonly known as:  DETROL Take 2 mg by mouth daily as needed (bladder control).   traMADol 50 MG tablet Commonly known as:  ULTRAM Take 50 mg by mouth 2 (two) times daily.   Vitamin D 2000 units Caps Take 2,000 Units by mouth daily.      Follow-up Information    Grayland Ormond Hestle, PA-C Follow up in 1 week(s).   Specialty:  Family Medicine Why:  CBC later this week to follow Hgb return to ER with worsening SOB/fatigue Contact information: [place] [address] [phone]        Thayer Headings, MD Follow up.   Specialty:  Infectious Diseases Why:  keep prior scheduled appointment Contact information: [address] [place] [address] [phone]            Time coordinating discharge: 35 min  [place]

## 2018-08-05 NOTE — Progress Notes (Signed)
Patient ID: [name], female   DOB: 10-29-1923, 84 y.o.   MRN: [id number]         [facility]  Date of Admission:  08/04/2018    Total days of antibiotics 5        Day 2 cefazolin         ASSESSMENT: She has cellulitis around her ulcer but I do not believe that she has established osteomyelitis.  PLAN: 1. Okay for discharge on cephalexin 500 mg 3 times daily 2. She already has a visit scheduled with my partner, Dr. Talbot Grumbling, 08/12/2018  Principal Problem:   Cellulitis Active Problems:   Varicose veins of lower extremities with ulcer ([place])   Atherosclerosis of native arteries of the extremities with ulceration (HCC)   Acquired hypothyroidism   HLD (hyperlipidemia)   Inflammatory neuropathy (HCC)   AF (paroxysmal atrial fibrillation) (HCC)   PVD (peripheral vascular disease) ([place])   Scheduled Meds: . aspirin EC  81 mg Oral Daily  . calcium carbonate  500 mg of elemental calcium Oral Q breakfast  . cholecalciferol  2,000 Units Oral Daily  . dabigatran  150 mg Oral BID  . diltiazem  300 mg Oral QHS  . furosemide  20 mg Oral Daily  . levothyroxine  50 mcg Oral QAC breakfast  . metoprolol tartrate  50 mg Oral BID  . multivitamin  1 tablet Oral Daily  . oxybutynin  5 mg Oral QHS  . pantoprazole  40 mg Oral Daily   Continuous Infusions: .  ceFAZolin (ANCEF) IV Stopped (08/05/18 0958)   PRN Meds:.hydrALAZINE   SUBJECTIVE: She is feeling about the same.  Review of Systems: Review of Systems  Constitutional: Negative for chills, diaphoresis and fever.  Gastrointestinal: Negative for abdominal pain, diarrhea, nausea and vomiting.  Musculoskeletal: Positive for joint pain.    Allergies  Allergen Reactions  . Chlorpheniramine     Other reaction(s): Unknown  . Fluconazole Rash  . Metronidazole Nausea And Vomiting, Other (See Comments) and Rash  . Monistat  [Tioconazole] Rash and Other (See Comments)  . Sulfa Antibiotics Rash and  Nausea And Vomiting    OBJECTIVE: Vitals:   08/04/18 1448 08/05/18 0006 08/05/18 0435 08/05/18 0900  BP:  122/64 120/71 (!) 119/58  Pulse:  70 60 71  Resp:  16 18 16   Temp:  98.5 F (36.9 C) 97.6 F (36.4 C) 98.6 F (37 C)  TempSrc:  Oral Oral Oral  SpO2:  95% 95% 95%  Weight: 65.3 kg  68.7 kg   Height: 5\' 6"  (1.676 m)      Body mass index is 24.45 kg/m.  Physical Exam  Constitutional: She is oriented to person, place, and time.  She is quite sleepy this morning.  Her daughter is at the bedside  Neurological: She is alert and oriented to person, place, and time.  Skin:  Decreased redness around her chronic ulcer over her left lateral malleolus.    Lab Results Lab Results  Component Value Date   WBC 7.9 08/05/2018   HGB 10.7 (L) 08/05/2018   HCT 35.6 (L) 08/05/2018   MCV 92.0 08/05/2018   PLT 306 08/05/2018    Lab Results  Component Value Date   CREATININE 0.89 08/05/2018   BUN 17 08/05/2018   NA 139 08/05/2018   K 4.1 08/05/2018   CL 107 08/05/2018   CO2 25 08/05/2018    Lab Results  Component Value Date   ALT 12 08/05/2018  AST 16 08/05/2018   ALKPHOS 86 08/05/2018   BILITOT 0.5 08/05/2018     Microbiology: Recent Results (from the past 240 hour(s))  Aerobic Culture (superficial specimen)     Status: None   Collection Time: 07/26/18  3:15 PM  Result Value Ref Range Status   Specimen Description   Final    ANKLE Performed at [facility] [address] [place], [place] [address]    Special Requests   Final    NONE Performed at [facility], [address] [place], [place] [address]    Gram Stain   Final    RARE WBC PRESENT,BOTH PMN AND MONONUCLEAR NO ORGANISMS SEEN Performed at [facility], [place] [address], [place], [place] [address]    Culture FEW STAPHYLOCOCCUS AUREUS  Final   Report Status 07/29/2018 FINAL  Final   Organism ID, Bacteria STAPHYLOCOCCUS AUREUS  Final      Susceptibility   Staphylococcus  aureus - MIC*    CIPROFLOXACIN <=0.5 SENSITIVE Sensitive     ERYTHROMYCIN <=0.25 SENSITIVE Sensitive     GENTAMICIN <=0.5 SENSITIVE Sensitive     OXACILLIN 0.5 SENSITIVE Sensitive     TETRACYCLINE <=1 SENSITIVE Sensitive     VANCOMYCIN 1 SENSITIVE Sensitive     TRIMETH/SULFA <=10 SENSITIVE Sensitive     CLINDAMYCIN <=0.25 SENSITIVE Sensitive     RIFAMPIN <=0.5 SENSITIVE Sensitive     Inducible Clindamycin NEGATIVE Sensitive     * FEW STAPHYLOCOCCUS AUREUS  Culture, blood (routine x 2)     Status: None (Preliminary result)   Collection Time: 08/04/18  1:47 PM  Result Value Ref Range Status   Specimen Description BLOOD LEFT ANTECUBITAL  Final   Special Requests   Final    BOTTLES DRAWN AEROBIC AND ANAEROBIC Blood Culture adequate volume   Culture   Final    NO GROWTH < 24 HOURS Performed at [facility], [place] [address] [place], [place] [address]    Report Status PENDING  Incomplete  Culture, blood (routine x 2)     Status: None (Preliminary result)   Collection Time: 08/04/18  1:57 PM  Result Value Ref Range Status   Specimen Description BLOOD BLOOD LEFT HAND  Final   Special Requests   Final    BOTTLES DRAWN AEROBIC AND ANAEROBIC Blood Culture adequate volume   Culture   Final    NO GROWTH < 24 HOURS Performed at [facility], [place] [address], [place], [place] [address]    Report Status PENDING  Incomplete    Michel Bickers, MD [facility] [phone] pager   [phone] cell 08/05/2018, 10:55 AM

## 2018-08-05 NOTE — Progress Notes (Signed)
Patient ready for discharge. Stool sample no longer needed at this time and ok to discharge patient. Patient reevaluated by MD prior to discharge.

## 2018-08-06 ENCOUNTER — Other Ambulatory Visit: Payer: Self-pay | Admitting: Physician Assistant

## 2018-08-06 ENCOUNTER — Telehealth: Payer: Self-pay | Admitting: *Deleted

## 2018-08-06 ENCOUNTER — Encounter: Payer: Medicare Other | Attending: Physician Assistant | Admitting: Physician Assistant

## 2018-08-06 DIAGNOSIS — I70243 Atherosclerosis of native arteries of left leg with ulceration of ankle: Secondary | ICD-10-CM | POA: Insufficient documentation

## 2018-08-06 DIAGNOSIS — L89524 Pressure ulcer of left ankle, stage 4: Secondary | ICD-10-CM | POA: Insufficient documentation

## 2018-08-06 DIAGNOSIS — I70233 Atherosclerosis of native arteries of right leg with ulceration of ankle: Secondary | ICD-10-CM | POA: Diagnosis not present

## 2018-08-06 DIAGNOSIS — L89514 Pressure ulcer of right ankle, stage 4: Secondary | ICD-10-CM | POA: Insufficient documentation

## 2018-08-06 DIAGNOSIS — Z950 Presence of cardiac pacemaker: Secondary | ICD-10-CM | POA: Diagnosis not present

## 2018-08-06 DIAGNOSIS — M86372 Chronic multifocal osteomyelitis, left ankle and foot: Secondary | ICD-10-CM | POA: Diagnosis not present

## 2018-08-06 LAB — HIV ANTIBODY (ROUTINE TESTING W REFLEX): HIV SCREEN 4TH GENERATION: NONREACTIVE

## 2018-08-06 NOTE — Telephone Encounter (Signed)
Patient's daughter [name] would like to know if [name]'s appointment should be moved until after the imaging is complete.  She is scheduled to see Dr [name] 9/9.  She was in the hospital over the weekend, is scheduled to have imaging done 9/17 (although is is currently a MRI ordered for 9/17, it should be a CT per daughter due to presence of pacemaker. Daughter [name] will call Dr [name] office to clarify). Please advise if you'd like to see patient 9/9 or after her imaging. [name], RN

## 2018-08-07 NOTE — Telephone Encounter (Signed)
CT scheduled for 9/10 2pm, she will come 9/11 10:00.

## 2018-08-07 NOTE — Telephone Encounter (Signed)
Yes, it would make sense to see her after the CT instead

## 2018-08-08 NOTE — Progress Notes (Signed)
MAEVA, DANT ([id number]) Visit Report for 08/06/2018 Arrival Information Details Patient Name: JAQUELINE, UBER Date of Service: 08/06/2018 9:30 AM Medical Record Number: [id number] Patient Account Number: [id number] Date of Birth/Sex: 1923-01-11 (84 y.o. F) Treating RN: Secundino Ginger Primary Care Eri Platten: Grayland Ormond Other Clinician: Referring Kimmerly Lora: Grayland Ormond Treating Aarron Wierzbicki/Extender: Melburn Hake, HOYT Weeks in Treatment: 40 Visit Information History Since Last Visit Added or deleted any medications: No Patient Arrived: Walker Any new allergies or adverse reactions: No Arrival Time: 09:46 Had a fall or experienced change in No Accompanied By: daughter activities of daily living that may affect Transfer Assistance: None risk of falls: Patient Identification Verified: Yes Signs or symptoms of abuse/neglect since last visito No Secondary Verification Process Yes Hospitalized since last visit: No Completed: Implantable device outside of the clinic excluding No Patient Requires Transmission-Based No cellular tissue based products placed in the center Precautions: since last visit: Patient Has Alerts: Yes Has Dressing in Place as Prescribed: Yes Patient Alerts: ABI AVVS 03/29/18 L Pain Present Now: No 1.05 R 1.03, TBI L .56, R .85 Electronic Signature(s) Signed: 08/07/2018 9:53:02 AM By: Secundino Ginger Entered By: Secundino Ginger on 08/06/2018 09:47:24 Chittum, [name] ([id number]) -------------------------------------------------------------------------------- Encounter Discharge Information Details Patient Name: [name] Date of Service: 08/06/2018 9:30 AM Medical Record Number: [id number] Patient Account Number: [id number] Date of Birth/Sex: December 05, 1922 (84 y.o. F) Treating RN: Cornell Barman Primary Care Merion Grimaldo: Grayland Ormond Other Clinician: Referring Kennis Wissmann: Grayland Ormond Treating Massie Cogliano/Extender: Melburn Hake, HOYT Weeks in Treatment: 66 Encounter  Discharge Information Items Discharge Condition: Stable Ambulatory Status: Wheelchair Discharge Destination: Home Health Transportation: Private Auto Accompanied By: daughter Schedule Follow-up Appointment: Yes Clinical Summary of Care: Electronic Signature(s) Signed: 08/06/2018 6:56:50 PM By: Gretta Cool, BSN, RN, CWS, Kim RN, BSN Entered By: Gretta Cool, BSN, RN, CWS, Kim on 08/06/2018 10:42:17 Joni Fears, [name] ([id number]) -------------------------------------------------------------------------------- Lower Extremity Assessment Details Patient Name: [name] Date of Service: 08/06/2018 9:30 AM Medical Record Number: [id number] Patient Account Number: [id number] Date of Birth/Sex: Feb 23, 1923 (84 y.o. F) Treating RN: Secundino Ginger Primary Care Marcial Pless: Grayland Ormond Other Clinician: Referring Loray Akard: Grayland Ormond Treating Delonda Coley/Extender: Melburn Hake, HOYT Weeks in Treatment: 51 Edema Assessment Assessed: [Left: No] [Right: No] Edema: [Left: No] [Right: No] Calf Left: Right: Point of Measurement: 29 cm From Medial Instep 29 cm 28 cm Ankle Left: Right: Point of Measurement: 12 cm From Medial Instep 18.5 cm 17 cm Vascular Assessment Claudication: Claudication Assessment [Left:None] [Right:None] Pulses: Dorsalis Pedis Palpable: [Left:Yes] [Right:Yes] Posterior Tibial Extremity colors, hair growth, and conditions: Extremity Color: [Left:Hyperpigmented] [Right:Normal] Hair Growth on Extremity: [Left:No] [Right:No] Temperature of Extremity: [Left:Warm] [Right:Warm] Capillary Refill: [Left:< 3 seconds] [Right:< 3 seconds] Toe Nail Assessment Left: Right: Thick: Yes Yes Discolored: Yes Yes Deformed: Yes Yes Improper Length and Hygiene: No No Electronic Signature(s) Signed: 08/07/2018 9:53:02 AM By: Secundino Ginger Entered By: Secundino Ginger on 08/06/2018 10:07:15 Angelino, [name] ([id number]) -------------------------------------------------------------------------------- Multi  Wound Chart Details Patient Name: [name] Date of Service: 08/06/2018 9:30 AM Medical Record Number: [id number] Patient Account Number: [id number] Date of Birth/Sex: May 03, 1923 (84 y.o. F) Treating RN: Montey Hora Primary Care Marcella Dunnaway: Grayland Ormond Other Clinician: Referring Tanveer Dobberstein: Grayland Ormond Treating Ottis Sarnowski/Extender: STONE III, HOYT Weeks in Treatment: 51 Vital Signs Height(in): 65 Pulse(bpm): 78 Weight(lbs): 154.3 Blood Pressure(mmHg): 122/56 Body Mass Index(BMI): 26 Temperature(F): 98.1 Respiratory Rate 18 (breaths/min): Photos: [1:No Photos] [2:No Photos] [N/A:N/A] Wound Location: [1:Right Malleolus - Lateral] [2:Left Malleolus - Lateral] [N/A:N/A] Wounding Event: [1:Gradually Appeared] [2:Gradually Appeared] [N/A:N/A]  Primary Etiology: [1:Pressure Ulcer] [2:Pressure Ulcer] [N/A:N/A] Secondary Etiology: [1:Arterial Insufficiency Ulcer] [2:Arterial Insufficiency Ulcer] [N/A:N/A] Comorbid History: [1:Cataracts, Congestive Heart Failure, Hypertension, Osteoarthritis, Neuropathy] [2:Cataracts, Congestive Heart Failure, Hypertension, Osteoarthritis, Neuropathy] [N/A:N/A] Date Acquired: [1:08/11/2015] [2:04/12/2018] [N/A:N/A] Weeks of Treatment: [1:51] [2:15] [N/A:N/A] Wound Status: [1:Open] [2:Open] [N/A:N/A] Measurements L x W x D [1:1.7x1.6x0.4] [2:2.4x2x1] [N/A:N/A] (cm) Area (cm) : [1:2.136] [2:3.77] [N/A:N/A] Volume (cm) : [1:0.855] [2:3.77] [N/A:N/A] % Reduction in Area: [1:-39.40%] [2:-380.30%] [N/A:N/A] % Reduction in Volume: [1:-86.30%] [2:-4672.20%] [N/A:N/A] Classification: [1:Category/Stage IV] [2:Category/Stage IV] [N/A:N/A] Exudate Amount: [1:Medium] [2:Large] [N/A:N/A] Exudate Type: [1:Purulent] [2:Purulent] [N/A:N/A] Exudate Color: [1:yellow, brown, green] [2:yellow, brown, green] [N/A:N/A] Foul Odor After Cleansing: [1:Yes] [2:Yes] [N/A:N/A] Odor Anticipated Due to [1:No] [2:No] [N/A:N/A] Product Use: Wound Margin: [1:Distinct,  outline attached] [2:Flat and Intact] [N/A:N/A] Granulation Amount: [1:Small (1-33%)] [2:Small (1-33%)] [N/A:N/A] Granulation Quality: [1:Pink] [2:Pink] [N/A:N/A] Necrotic Amount: [1:Large (67-100%)] [2:Large (67-100%)] [N/A:N/A] Necrotic Tissue: [1:Eschar, Adherent Slough] [2:Eschar, Adherent Slough] [N/A:N/A] Exposed Structures: [1:Fat Layer (Subcutaneous Tissue) Exposed: Yes Fascia: No Tendon: No Muscle: No Joint: No Bone: No] [2:Fat Layer (Subcutaneous Tissue) Exposed: Yes Fascia: No Tendon: No Muscle: No Joint: No Bone: No] [N/A:N/A] Epithelialization: [1:None] [2:None] [N/A:N/A] Periwound Skin Texture: Excoriation: No Excoriation: No N/A Induration: No Induration: No Callus: No Callus: No Crepitus: No Crepitus: No Rash: No Rash: No Scarring: No Scarring: No Periwound Skin Moisture: Maceration: Yes Maceration: Yes N/A Dry/Scaly: No Dry/Scaly: No Periwound Skin Color: Erythema: Yes Erythema: Yes N/A Rubor: Yes Atrophie Blanche: No Atrophie Blanche: No Cyanosis: No Cyanosis: No Ecchymosis: No Ecchymosis: No Hemosiderin Staining: No Hemosiderin Staining: No Mottled: No Mottled: No Pallor: No Pallor: No Rubor: No Erythema Location: Circumferential Circumferential N/A Temperature: No Abnormality No Abnormality N/A Tenderness on Palpation: Yes Yes N/A Wound Preparation: Ulcer Cleansing: Ulcer Cleansing: N/A Rinsed/Irrigated with Saline Rinsed/Irrigated with Saline Topical Anesthetic Applied: Topical Anesthetic Applied: Other: lidocaine 4% Other: lidocaine 4% Treatment Notes Electronic Signature(s) Signed: 08/06/2018 5:51:08 PM By: Montey Hora Entered By: Montey Hora on 08/06/2018 10:16:46 Ernsberger, [name] ([id number]) -------------------------------------------------------------------------------- [place] Details Patient Name: [name] Date of Service: 08/06/2018 9:30 AM Medical Record Number: [id number] Patient Account  Number: [id number] Date of Birth/Sex: 1923-11-25 (84 y.o. F) Treating RN: Montey Hora Primary Care Eagle Pitta: Grayland Ormond Other Clinician: Referring Cleburn Maiolo: Grayland Ormond Treating Chang Tiggs/Extender: Melburn Hake, HOYT Weeks in Treatment: 74 Active Inactive ` Abuse / Safety / Falls / Self Care Management Nursing Diagnoses: Potential for falls Goals: Patient will not experience any injury related to falls Date Initiated: 08/10/2017 Target Resolution Date: 11/10/2017 Goal Status: Active Interventions: Assess Activities of Daily Living upon admission and as needed Assess fall risk on admission and as needed Assess: immobility, friction, shearing, incontinence upon admission and as needed Notes: ` Nutrition Nursing Diagnoses: Imbalanced nutrition Potential for alteratiion in Nutrition/Potential for imbalanced nutrition Goals: Patient/caregiver agrees to and verbalizes understanding of need to use nutritional supplements and/or vitamins as prescribed Date Initiated: 08/10/2017 Target Resolution Date: 12/08/2017 Goal Status: Active Interventions: Assess patient nutrition upon admission and as needed per policy Notes: ` Orientation to the Wound Care Program Nursing Diagnoses: Knowledge deficit related to the wound healing center program Goals: Patient/caregiver will verbalize understanding of the [facility] Date Initiated: 08/10/2017 Target Resolution Date: 09/08/2017 TARIANA, MOLDOVAN ([id number]) Goal Status: Active Interventions: Provide education on orientation to the wound center Notes: ` Pain, Acute or Chronic Nursing Diagnoses: Pain, acute or chronic: actual or potential Potential alteration in comfort, pain Goals: Patient/caregiver will verbalize adequate  pain control between visits Date Initiated: 08/10/2017 Target Resolution Date: 12/08/2017 Goal Status: Active Interventions: Complete pain assessment as per visit  requirements Notes: ` Wound/Skin Impairment Nursing Diagnoses: Impaired tissue integrity Knowledge deficit related to ulceration/compromised skin integrity Goals: Ulcer/skin breakdown will have a volume reduction of 80% by week 12 Date Initiated: 08/10/2017 Target Resolution Date: 12/01/2017 Goal Status: Active Interventions: Assess patient/caregiver ability to perform ulcer/skin care regimen upon admission and as needed Notes: Electronic Signature(s) Signed: 08/06/2018 5:51:08 PM By: Montey Hora Entered By: Montey Hora on 08/06/2018 10:16:35 Fisher, [name] ([id number]) -------------------------------------------------------------------------------- Pain Assessment Details Patient Name: [name] Date of Service: 08/06/2018 9:30 AM Medical Record Number: [id number] Patient Account Number: [id number] Date of Birth/Sex: Aug 27, 1923 (84 y.o. F) Treating RN: Secundino Ginger Primary Care Camdin Hegner: Grayland Ormond Other Clinician: Referring Cheney Gosch: Grayland Ormond Treating Nygel Prokop/Extender: Melburn Hake, HOYT Weeks in Treatment: 70 Active Problems Location of Pain Severity and Description of Pain Patient Has Paino No Site Locations Pain Management and Medication Current Pain Management: Goals for Pain Management pt denies any pain at this time. Electronic Signature(s) Signed: 08/07/2018 9:53:02 AM By: Secundino Ginger Entered By: Secundino Ginger on 08/06/2018 09:49:00 Wimbush, [name] ([id number]) -------------------------------------------------------------------------------- Patient/Caregiver Education Details Patient Name: [name] Date of Service: 08/06/2018 9:30 AM Medical Record Number: [id number] Patient Account Number: [id number] Date of Birth/Gender: [birth year] (84 y.o. F) Treating RN: Cornell Barman Primary Care Physician: Grayland Ormond Other Clinician: Referring Physician: Grayland Ormond Treating Physician/Extender: Sharalyn Ink in Treatment:  30 Education Assessment Education Provided To: Patient and Caregiver Education Topics Provided Wound/Skin Impairment: Handouts: Caring for Your Ulcer, Other: HHRN to change dressing as needed Methods: Demonstration Responses: State content correctly Electronic Signature(s) Signed: 08/06/2018 6:56:50 PM By: Gretta Cool, BSN, RN, CWS, Kim RN, BSN Entered By: Gretta Cool, BSN, RN, CWS, Kim on 08/06/2018 10:43:13 Robleto, [name] ([id number]) -------------------------------------------------------------------------------- Wound Assessment Details Patient Name: [name] Date of Service: 08/06/2018 9:30 AM Medical Record Number: [id number] Patient Account Number: [id number] Date of Birth/Sex: [birth year] (84 y.o. 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 Etiology: Date Acquired: 08/11/2015 Wound Status: Open Weeks Of Treatment: 51 Comorbid Cataracts, Congestive Heart Failure, Clustered Wound: No History: Hypertension, Osteoarthritis, Neuropathy Photos Photo Uploaded By: Secundino Ginger on 08/06/2018 10:42:00 Wound Measurements Length: (cm) 1.7 % Reductio Width: (cm) 1.6 % Reductio Depth: (cm) 0.4 Epithelial Area: (cm) 2.136 Tunneling Volume: (cm) 0.855 Undermini n in Area: -39.4% n in Volume: -86.3% ization: None : No ng: No Wound Description Classification: Category/Stage IV Foul Odor Wound Margin: Distinct, outline attached Due to Pro Exudate Amount: Medium Slough/Fib Exudate Type: Purulent Exudate Color: yellow, brown, green After Cleansing: Yes duct Use: No rino Yes Wound Bed Granulation Amount: Small (1-33%) Exposed Structure Granulation Quality:  Pink Fascia Exposed: No Necrotic Amount: Large (67-100%) Fat Layer (Subcutaneous Tissue) Exposed: Yes Necrotic Quality: Eschar, Adherent Slough Tendon Exposed: No Muscle Exposed: No Joint Exposed: No Bone Exposed: No Periwound Skin Texture Macneal, [name] ([id number]) Texture Color No Abnormalities Noted: No No Abnormalities Noted: No Callus: No Atrophie Blanche: No Crepitus: No Cyanosis: No Excoriation: No Ecchymosis: No Induration: No Erythema: Yes Rash: No Erythema Location: Circumferential Scarring: No Hemosiderin Staining: No Mottled: No Moisture Pallor: No No Abnormalities Noted: No Rubor: Yes Dry /  Scaly: No Maceration: Yes Temperature / Pain Temperature: No Abnormality Tenderness on Palpation: Yes Wound Preparation Ulcer Cleansing: Rinsed/Irrigated with Saline Topical Anesthetic Applied: Other: lidocaine 4%, Treatment Notes Wound #1 (Right, Lateral Malleolus) 8. Negative Pressure Wound Therapy Wound Vac to wound continuously at 157mm/hg pressure Number of foam/gauze pieces used in the dressing = Change canister as needed. Notes Snap Vac one piece of foam Electronic Signature(s) Signed: 08/07/2018 9:53:02 AM By: Secundino Ginger Entered By: Secundino Ginger on 08/06/2018 10:02:23 [name] ([id number]) -------------------------------------------------------------------------------- Wound Assessment Details Patient Name: [name] Date of Service: 08/06/2018 9:30 AM Medical Record Number: [id number] Patient Account Number: [id number] Date of Birth/Sex: 1923/11/09 (84 y.o. F) Treating RN: Secundino Ginger Primary Care Ronika Kelson: Grayland Ormond Other Clinician: Referring Nichoals Heyde: Grayland Ormond Treating Gabrelle Roca/Extender: STONE III, HOYT Weeks in Treatment: 51 Wound Status Wound Number: 2 Primary Pressure Ulcer Etiology: Wound Location: Left Malleolus - Lateral Secondary Arterial Insufficiency Ulcer Wounding Event: Gradually  Appeared Etiology: Date Acquired: 04/12/2018 Wound Status: Open Weeks Of Treatment: 15 Comorbid Cataracts, Congestive Heart Failure, Clustered Wound: No History: Hypertension, Osteoarthritis, Neuropathy Photos Photo Uploaded By: Secundino Ginger on 08/06/2018 10:42:01 Wound Measurements Length: (cm) 2.4 % Reductio Width: (cm) 2 % Reductio Depth: (cm) 1 Epithelial Area: (cm) 3.77 Tunneling Volume: (cm) 3.77 Undermini n in Area: -380.3% n in Volume: -4672.2% ization: None : No ng: No Wound Description Classification: Category/Stage IV Foul Odor Wound Margin: Flat and Intact Due to Pro Exudate Amount: Large Slough/Fib Exudate Type: Purulent Exudate Color: yellow, brown, green After Cleansing: Yes duct Use: No rino Yes Wound Bed Granulation Amount: Small (1-33%) Exposed Structure Granulation Quality: Pink Fascia Exposed: No Necrotic Amount: Large (67-100%) Fat Layer (Subcutaneous Tissue) Exposed: Yes Necrotic Quality: Eschar, Adherent Slough Tendon Exposed: No Muscle Exposed: No Joint Exposed: No Bone Exposed: No Periwound Skin Texture Tesoro, [name] ([id number]) Texture Color No Abnormalities Noted: No No Abnormalities Noted: No Callus: No Atrophie Blanche: No Crepitus: No Cyanosis: No Excoriation: No Ecchymosis: No Induration: No Erythema: Yes Rash: No Erythema Location: Circumferential Scarring: No Hemosiderin Staining: No Mottled: No Moisture Pallor: No No Abnormalities Noted: No Rubor: No Dry / Scaly: No Maceration: Yes Temperature / Pain Temperature: No Abnormality Tenderness on Palpation: Yes Wound Preparation Ulcer Cleansing: Rinsed/Irrigated with Saline Topical Anesthetic Applied: Other: lidocaine 4%, Treatment Notes Wound #2 (Left, Lateral Malleolus) 3. Peri-wound Care: Skin Prep 4. Dressing Applied: Iodoflex 5. Secondary Dressing Applied Bordered Foam Dressing Electronic Signature(s) Signed: 08/07/2018 9:53:02 AM By: Secundino Ginger Entered By: Secundino Ginger on 08/06/2018 10:04:56 Piontek, [name] ([id number]) -------------------------------------------------------------------------------- [place] Details Patient Name: [name] Date of Service: 08/06/2018 9:30 AM Medical Record Number: [id number] Patient Account Number: [id number] Date of Birth/Sex: 06/08/1923 (84 y.o. F) Treating RN: Secundino Ginger Primary Care Linzie Criss: Grayland Ormond Other Clinician: Referring Kenise Barraco: Grayland Ormond Treating Oma Marzan/Extender: Melburn Hake, HOYT Weeks in Treatment: 51 Vital Signs Time Taken: 09:40 Temperature (F): 98.1 Height (in): 65 Pulse (bpm): 78 Weight (lbs): 154.3 Respiratory Rate (breaths/min): 18 Body Mass Index (BMI): 25.7 Blood Pressure (mmHg): 122/56 Reference Range: 80 - 120 mg / dl Electronic Signature(s) Signed: 08/07/2018 9:53:02 AM By: Secundino Ginger Entered BySecundino Ginger on 08/06/2018 09:49:32

## 2018-08-08 NOTE — Progress Notes (Signed)
[name], [name] ([id number]) Visit Report for 08/06/2018 Chief Complaint Document Details Patient Name: [name], [name]. Date of Service: 08/06/2018 9:30 AM Medical Record Number: [id number] Patient Account Number: [id number] Date of Birth/Sex: 1923-06-02 (84 y.o. F) Treating RN: Montey Hora Primary Care Provider: Grayland Ormond Other Clinician: Referring Provider: Grayland Ormond Treating Provider/Extender: Melburn Hake, HOYT Weeks in Treatment: 26 Information Obtained from: Patient Chief Complaint Patient is here for right lateral malleolus and left lateral malleolus ulcer Electronic Signature(s) Signed: 08/07/2018 11:11:19 AM By: Worthy Keeler PA-C Entered By: Worthy Keeler on 08/06/2018 10:05:24 [name] ([id number]) -------------------------------------------------------------------------------- Debridement Details Patient Name: [name] Date of Service: 08/06/2018 9:30 AM Medical Record Number: [id number] Patient Account Number: [id number] Date of Birth/Sex: Jul 21, 1923 (84 y.o. F) Treating RN: Montey Hora Primary Care Provider: Grayland Ormond Other Clinician: Referring Provider: Grayland Ormond Treating Provider/Extender: Melburn Hake, HOYT Weeks in Treatment: 51 Debridement Performed for Wound #1 Right,Lateral Malleolus Assessment: Performed By: Physician STONE III, HOYT E., PA-C Debridement Type: Debridement Severity of Tissue Pre Fat layer exposed Debridement: Pre-procedure Verification/Time Yes - 10:19 Out Taken: Start Time: 10:19 Pain Control: Lidocaine 4% Topical Solution Total Area Debrided (L x W): 1.7 (cm) x 1.6 (cm) = 2.72 (cm) Tissue and other material Viable, Non-Viable, Slough, Subcutaneous, Slough debrided: Level: Skin/Subcutaneous Tissue Debridement Description: Excisional Instrument: Curette Bleeding: Minimum Hemostasis Achieved: Pressure End Time: 10:21 Procedural Pain: 0 Post Procedural Pain: 0 Response to  Treatment: Procedure was tolerated well Level of Consciousness: Awake and Alert Post Debridement Measurements of Total Wound Length: (cm) 1.7 Stage: Category/Stage IV Width: (cm) 1.6 Depth: (cm) 0.5 Volume: (cm) 1.068 Character of Wound/Ulcer Post Improved Debridement: Severity of Tissue Post Debridement: Fat layer exposed Post Procedure Diagnosis Same as Pre-procedure Electronic Signature(s) Signed: 08/06/2018 5:51:08 PM By: Montey Hora Signed: 08/07/2018 11:11:19 AM By: Worthy Keeler PA-C Entered By: Montey Hora on 08/06/2018 10:21:28 [name] ([id number]) -------------------------------------------------------------------------------- HPI Details Patient Name: [name] Date of Service: 08/06/2018 9:30 AM Medical Record Number: [id number] Patient Account Number: [id number] Date of Birth/Sex: [birth year] (84 y.o. 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 Dr. Amalia Hailey the podiatrist saw her on 07/20/2017 and sent her to the wound center for possible hyperbaric oxygen therapy. past medical history of peripheral vascular disease, varicose veins, status post appendectomy, basal cell carcinoma excision from the left leg, cholecystectomy, pacemaker placement, right lower extremity angiography done by Dr. dew in March 2017 with placement of a stent. there is also note of a successful ablation of the right small saphenous vein done which was reviewed by ultrasound on 10/24/2016. the patient had a right small saphenous vein ablation done on 10/20/2016. The patient has never been a  smoker. She has been seen by Dr. Corene Cornea dew the vascular surgeon who most recently saw her on 06/15/2017 for evaluation of ongoing problems with right leg swelling. She had a lower extremity arterial duplex examination done(02/13/17) which showed patent distal right superficial femoral artery stent and above-the-knee popliteal stent without evidence of restenosis. The ABI was more than 1.3 on the right and more than 1.3 on the left. This was consistent with noncompressible arteries due to medial calcification. The  right great toe pressure and PPG waveforms are within normal limits and the left great toe pressure and PPG waveforms are decreased. he recommended she continue to wear her compression stockings and continue with elevation. She is scheduled to have a noninvasive arterial study in the near future 08/16/2017 -- had a lower extremity arterial duplex examination done which showed patent distal right superficial femoral artery stent and above-the-knee popliteal stent without evidence of restenosis. The ABI was more than 1.3 on the right and more than 1.3 on the left. This was consistent with noncompressible arteries due to medial calcification. The right great toe pressure and PPG waveforms are within normal limits and the left great toe pressure and PPG waveforms are decreased. the x-ray of the right ankle has not yet been done 08/24/2017 -- had a right ankle x-ray -- IMPRESSION:1. No fracture, bone lesion or evidence of osteomyelitis. 2. Lateral soft tissue swelling with a soft tissue ulcer. she has not yet seen the vascular surgeon for review 08/31/17 on evaluation today patient's wound appears to be showing signs of improvement. She still with her appointment with vascular in order to review her results of her vascular study and then determine if any intervention would be recommended at that time. No fevers, chills, nausea, or vomiting noted at this time. She has been tolerating the dressing  changes without complication. 09/28/17 on evaluation today patient's wound appears to show signs of good improvement in regard to the granulation tissue which is surfacing. There is still a layer of slough covering the wound and the posterior portion is still significantly deeper than the anterior nonetheless there has been some good sign of things moving towards the better. She is going to go back to Dr. dew for reevaluation to ensure her blood flow is still appropriate. That will be before her next evaluation with [place] next week. No fevers, chills, nausea, or vomiting noted at this time. Patient does have some discomfort rated to be a 3-4/10 depending on activity specifically cleansing the wound makes it worse. 10/05/2017 -- the patient was seen by Dr. Lucky Cowboy last week and noninvasive studies showed a normal right ABI with brisk triphasic waveforms consistent with no arterial insufficiency including normal digital pressures. The duplex showed a patent distal right SFA stent and the proximal SFA was also normal. He was pleased with her test and thought she should have enough of perfusion for normal wound healing. He would see her back in 6 months time. 12/21/17 on evaluation today patient appears to be doing fairly well in regard to her right lateral ankle wound. Unfortunately the main issue that she is expansion at this point is that she is having some issues with what appears to be some cellulitis in the AYSIAH, JURADO. ([id number]) right anterior shin. She has also been noting a little bit of uncomfortable feeling especially last night and her ankle area. I'm afraid that she made the developing a little bit of an infection. With that being said I think it is in the early stages. 12/28/17 on evaluation today patient's ankle appears to be doing excellent. She's making good progress at this point the cellulitis seems to have improved after last week's evaluation. Overall she is having no significant  discomfort which is excellent news. She does have an appointment with Dr. dew on March 29, 2018 for reevaluation in regard to the stent he placed. She seems to have excellent blood flow in the right lower extremity. 01/19/12 on evaluation today patient's wound  appears to be doing very well. In fact she does not appear to require debridement at this point, there's no evidence of infection, and overall from the standpoint of the wound she seems to be doing very well. With that being said I believe that it may be time to switch to different dressing away from the [facility] Dressing she tells me she does have a lot going on her friend actually passed away yesterday and she's also having a lot of issues with her husband this obviously is weighing heavy on her as far as your thoughts and concerns today. 01/25/18 on evaluation today patient appears to be doing fairly well in regard to her right lateral malleolus. She has been tolerating the dressing changes without complication. Overall I feel like this is definitely showing signs of improvement as far as how the overall appearance of the wound is there's also evidence of epithelium start to migrate over the granulation tissue. In general I think that she is progressing nicely as far as the wound is concerned. The only concern she really has is whether or not we can switch to every other week visits in order to avoid having as many appointments as her daughters have a difficult time getting her to her appointments as well as the patient's husband to his he is not doing very well at this point. 02/22/18 on evaluation today patient's right lateral malleolus ulcer appears to be doing great. She has been tolerating the dressing changes without complication. Overall you making excellent progress at this time. Patient is having no significant discomfort. 03/15/18 on evaluation today patient appears to be doing much more poorly in regard to her right lateral  ankle ulcer at this point. Unfortunately since have last seen her her husband has passed just a few days ago is obviously weighed heavily on her her daughter also had surgery well she is with her today as usual. There does not appear to be any evidence of infection she does seem to have significant contusion/deep tissue injury to the right lateral malleolus which was not noted previous when I saw her last. It's hard to tell of exactly when this injury occurred although during the time she was spending the night in the hospital this may have been most likely. 03/22/18 on evaluation today patient appears to actually be doing very well in regard to her ulcer. She did unfortunately have a setback which was noted last week however the good news is we seem to be getting back on track and in fact the wound in the core did still have some necrotic tissue which will be addressed at this point today but in general I'm seeing signs that things are on the up and up. She is glad to hear this obviously she's been somewhat concerned that due to the how her wound digressed more recently. 03/29/18 on evaluation today patient appears to be doing fairly well in regard to her right lower extremity lateral malleolus ulcer. She unfortunately does have a new area of pressure injury over the inferior portion where the wound has opened up a little bit larger secondary to the pressure she seems to be getting. She does tell me sometimes when she sleeps at night that it actually hurts and does seem to be pushing on the area little bit more unfortunately. There does not appear to be any evidence of infection which is good news. She has been tolerating the dressing changes without complication. She also did have some bruising in  the left second and third toes due to the fact that she may have bump this or injured it although she has neuropathy so she does not feel she did move recently that may have been where this came from.  Nonetheless there does not appear to be any evidence of infection at this time. 04/12/18 on evaluation today patient's wound on the right lateral ankle actually appears to be doing a little bit better with a lot of necrotic docking tissue centrally loosening up in clearing away. However she does have the beginnings of a deep tissue injury on the left lateral malleolus likely due to the fact we've been trying offload the right as much as we have. I think she may benefit from an assistive soft device to help with offloading and it looks like they're looking at one of the doughnut conditions that wraps around the lower leg to offload which I think will definitely do a good job. With that being said I think we definitely need to address this issue on the left before it becomes a wound. Patient is not having significant pain. 04/19/18 on evaluation today patient appears to be doing excellent in regard to the progress she's made with her right lateral ankle ulcer. The left ankle region which did show evidence of a deep tissue injury seems to be resolving there's little fluid noted underneath and a blister there's nothing open at this point in time overall I feel like this is progressing nicely which is good news. She does not seem to be having significant discomfort at this point which is also good news. 04/25/18-She is here in follow up evaluation for bilateral lateral malleolar ulcers. The right lateral malleolus ulcer with pale subcutaneous tissue exposure, central area of ulcer with tendon/periosteum exposed. The left lateral malleolus ulcer now with Martine, [name] ([id number]) central area of nonviable tissue, otherwise deep tissue injury. She is wearing compression wraps to the left lower extremity, she will place the right lower extremity compression wraps on when she gets home. She will be out of town over the weekend and return next week and follow-up appointment. She completed her doxycycline  this morning 05/03/18 on evaluation today patient appears to be doing very well in regard to her right lateral ankle ulcer in general. At least she's showing some signs of improvement in this regard. Unfortunately she has some additional injury to the left lateral malleolus region which appears to be new likely even over the past several days. Again this determination is based on the overall appearance. With that being said the patient is obviously frustrated about this currently. 05/10/18-She is here in follow-up evaluation for bilateral lateral malleolar ulcers. She states she has purchased offloading shoes/boots and they will arrive tomorrow. She was asked to bring them in the office at next week's appointment so her provider is aware of product being utilized. She continues to sleep on right or left side, she has been encouraged to sleep on her back. The right lateral malleolus ulcer is precariously close to peri-osteum; will order xray. The left lateral malleolus ulcer is improved. Will switch back to santyl; she will follow up next week. 05/17/18 on evaluation today patient actually appears to be doing very well in regard to her malleolus her ulcers compared to last time I saw them. She does not seem to have as much in the way of contusion at this point which is great news. With that being said she does continue to have discomfort and I  do believe that she is still continuing to benefit from the offloading/pressure reducing boots that were recommended. I think this is the key to trying to get this to heal up completely. 05/24/18 on evaluation today patient actually appears to be doing worse at this point in time unfortunately compared to her last week's evaluation. She is having really no increased pain which is good news unfortunately she does have more maceration in your theme and noted surrounding the right lateral ankle the left lateral ankle is not really is erythematous I do not see signs of  the overt cellulitis on that side. Unfortunately the wounds do not seem to have shown any signs of improvement since the last evaluation. She also has significant swelling especially on the right compared to previous some of this may be due to infection however also think that she may be served better while she has these wounds by compression wrapping versus continuing to use the Juxta-Lite for the time being. Especially with the amount of drainage that she is experiencing at this point. No fevers, chills, nausea, or vomiting noted at this time. 05/31/18 on evaluation today patient appears to actually be doing better in regard to her right lateral lower extremity ulcer specifically on the malleolus region. She has been tolerating the antibiotic without complication. With that being said she still continues to have issues but a little bit of redness although nothing like she what she was experiencing previous. She still continues to pressure to her ankle area she did get the problem on offloading boots unfortunately she will not wear them she states there too uncomfortable and she can't get in and out of the bed. Nonetheless at this point her wounds seem to be continually getting worse which is not what we want I'm getting somewhat concerned about her progress and how things are going to proceed if we do not intervene in some way shape or form. I therefore had a very lengthy conversation today about offloading yet again and even made a specific suggestion for switching her to a memory foam mattress and even gave the information for a specific one that they could look at getting if it was something that they were interested in considering. She does not want to be considered for a hospital bed air mattress although honestly insurance would not cover it that she does not have any wounds on her trunk. 06/14/18 on evaluation today both wounds over the bilateral lateral malleolus her ulcers appear to be doing  better there's no evidence of pressure injury at this point. She did get the foam mattress for her bed and this does seem to have been extremely beneficial for her in my pinion. Her daughter states that she is having difficulty getting out of bed because of how soft it is. The patient also relates this to be. Nonetheless I do feel like she's actually doing better. Unfortunately right after and around the time she was getting the mattress she also sustained a fall when she got up to go pick up the phone and ended up injuring her right elbow she has 18 sutures in place. We are not caring for this currently although home health is going to be taking the sutures out shortly. Nonetheless this may be something that we need to evaluate going forward. It depends on how well it has or has not healed in the end. She also recently saw an orthopedic specialist for an injection in the right shoulder just before her fall unfortunately  the fall seems to have worsened her pain. 06/21/18 on evaluation today patient appears to be doing about the same in regard to her lateral malleolus ulcers. Both appear to be just a little bit deeper but again we are clinging away the necrotic and dead tissue which I think is why this is progressing towards a deeper realm as opposed to improving from my measurement standpoint in that regard. Nonetheless she has been tolerating the dressing changes she absolutely hates the memory foam mattress topper that was obtained for her nonetheless I do believe this is still doing excellent as far as taking care of excess pressure in regard to the lateral malleolus regions. She in fact has no pressure injury that I see whereas in weeks past it was week by week I was constantly seeing new pressure injuries. Overall I think it has been very beneficial for her. 07/03/18; patient arrives in my clinic today. She has deep punched out areas over her bilateral lateral malleoli. The area on the right has  some more depth. KATELY, GRAFFAM ([id number]) We spent a lot of time today talking about pressure relief for these areas. This started when her daughter asked for a prescription for a memory foam mattress. I have never written a prescription for a mattress and I don't think insurances would pay for that on an ordinary bed. In any case he came up that she has foam boots that she refuses to wear. I would suggest going to these before any other offloading issues when she is in bed. They say she is meticulous about offloading this the rest of the day 07/10/18- She is seen in follow-up evaluation for bilateral, lateral malleolus ulcers. There is no improvement in the ulcers. She has purchased and is sleeping on a memory foam mattress/overlay, she has been using the offloading boots nightly over the past week. She has a follow up appointment with vascular medicine at the end of October, in my opinion this follow up should be expedited given her deterioration and suboptimal TBI results. We will order plain film xray of the left ankle as deeper structures are palpable; would consider having MRI, regardless of xray report(s). The ulcers will be treated with iodoflex/iodosorb, she is unable to safely change the dressings daily with santyl. 07/19/18 on evaluation today patient appears to be doing in general visually well in regard to her bilateral lateral malleolus ulcers. She has been tolerating the dressing changes without complication which is good news. With that being said we did have an x-ray performed on 07/12/18 which revealed a slight loosen see in the lateral portion of the distal left fibula which may represent artifact but underline lytic destruction or osteomyelitis could not be excluded. MRI was recommended. With that being said we can see about getting the patient scheduled for an MRI to further evaluate this area. In fact we have that scheduled currently for August 20 19,019. 07/26/18 on  evaluation today patient's wound on the right lateral ankle actually appears to be doing fairly well at this point in my pinion. She has made some good progress currently. With that being said unfortunately in regard to the left lateral ankle ulcer this seems to be a little bit more problematic at this time. In fact as I further evaluated the situation she actually had bone exposed which is the first time that's been the case in the bone appear to be necrotic. Currently I did review patient's note from Dr. Bunnie Domino office with [facility]. He stated that ABI was 1.26 on the right and 0.95 on the left with good waveforms. Her perfusion is stable not reduced from previous studies and her digital waveforms were pretty good particularly on the right. His conclusion upon review of the note was that there was not much she could do to improve her perfusion and he felt she was adequate for wound healing. His suggestion was that she continued to see [place] and consider a synthetic skin graft if there was no underlying infection. He plans to see her back in six months or as needed. 08/01/18 on evaluation today patient appears to be doing better in regard to her right lateral ankle ulcer. Her left lateral ankle ulcer is about the same she still has bone involvement in evidence of necrosis. There does not appear to be evidence of infection at this time On the right lateral lower extremity. I have started her on the Augmentin she picked this up and started this yesterday. This is to get her through until she sees infectious disease which is scheduled for 08/12/18. 08/06/18 on evaluation today patient appears to be doing rather well considering my discussion with patient's daughter at the end of last week. The area which was marked where she had erythema seems to be improved and this is good news. With that being said overall the patient seems to be making good improvement when it comes to the overall  appearance of the right lateral ankle ulcer although this has been slow she at least is coming around in this regard. Unfortunately in regard to the left lateral ankle ulcer this is osteomyelitis based on the pathology report as well is bone culture. Nonetheless we are still waiting CT scan. Unfortunately the MRI we originally ordered cannot be performed as the patient is a pacemaker which I had overlooked. Nonetheless we are working on the CT scan approval and scheduling as of now. She did go to the hospital over the weekend and was placed on IV Cefzo for a couple of days. Fortunately this seems to have improved the erythema quite significantly which is good news. There does not appear to be any evidence of worsening infection at this time. She did have some bleeding after the last debridement therefore I did not perform any sharp debridement in regard to left lateral ankle at this point. Patient has been approved for a snap vac for the right lateral ankle. Electronic Signature(s) Signed: 08/07/2018 11:11:19 AM By: Worthy Keeler PA-C Entered By: Worthy Keeler on 08/06/2018 10:41:46 Darcey, [name] ([id number]) -------------------------------------------------------------------------------- Physical Exam Details Patient Name: [name] Date of Service: 08/06/2018 9:30 AM Medical Record Number: [id number] Patient Account Number: [id number] Date of Birth/Sex: February 23, 1923 (84 y.o. F) Treating RN: Montey Hora Primary Care Provider: Grayland Ormond Other Clinician: Referring Provider: Grayland Ormond Treating Provider/Extender: STONE III, HOYT Weeks in Treatment: 58 Constitutional Well-nourished and well-hydrated in no acute distress. Respiratory normal breathing without difficulty. Cardiovascular trace pitting edema of the bilateral lower extremities. Psychiatric this patient is able to make decisions and demonstrates good insight into disease process. Alert and Oriented x 3.  pleasant and cooperative. Notes Patient's wound bed at this time on the right lateral ankle did require sharp debridement to remove slough and necrotic tissue from the surface of the wound of preparation for the Wound VAC application. Again we are using a snap vac. She tolerated the application and debridement without complication. In regard to the left lateral ankle no sharp debridement  was performed she had discomfort even just with light mechanical debridement although she did not have any significant bleeding which was good news. Again when I debrided her wound last week she actually had some bleeding unfortunately for the next 24 hours. Electronic Signature(s) Signed: 08/07/2018 11:11:19 AM By: Worthy Keeler PA-C Entered By: Worthy Keeler on 08/06/2018 10:42:46 Headlee, [name] ([id number]) -------------------------------------------------------------------------------- Physician Orders Details Patient Name: [name] Date of Service: 08/06/2018 9:30 AM Medical Record Number: [id number] Patient Account Number: [id number] Date of Birth/Sex: 1923-07-21 (84 y.o. 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 o Cleanse wound with mild soap and water o May Shower, gently pat wound dry prior to applying new dressing. Wound #2 Left,Lateral Malleolus o  Clean wound with Normal Saline. o Cleanse wound with mild soap and water o May Shower, gently pat wound dry prior to applying new dressing. Anesthetic (add to Medication List) Wound #1 Right,Lateral Malleolus o Topical Lidocaine 4% cream applied to wound bed prior to debridement (In Clinic Only). Wound #2 Left,Lateral Malleolus o Topical Lidocaine 4% cream applied to wound bed prior to debridement (In Clinic Only). Primary Wound Dressing Wound #2 Left,Lateral Malleolus o Iodoflex Secondary Dressing Wound #2 Left,Lateral Malleolus o Boardered Foam Dressing Dressing Change Frequency Wound #1 Right,Lateral Malleolus o Change dressing every week - HHRN may remove SNAP vac and use iodoflex and bordered foam dressing if SNAP vac fills up or becomes soiled Wound #2 Left,Lateral Malleolus Alycia, [place] [name] ([id number]) o Change Dressing Monday, Wednesday, Friday Follow-up Appointments Wound #1 Right,Lateral Malleolus o Return Appointment in 1 week. Wound #2 Left,Lateral Malleolus o Return Appointment in 1 week. Edema Control Wound #1 Right,Lateral Malleolus o Patient to wear own Velcro compression garment. o Elevate legs to the level of the heart and pump ankles as often as possible Wound #2 Left,Lateral Malleolus o Patient to wear own Velcro compression garment. o Elevate legs to the level of the heart and pump ankles as often as possible Off-Loading Wound #1 Right,Lateral Malleolus o Mattress o Turn and reposition every 2 hours o Other: - do not put pressure on your right ankle Wound #2 Left,Lateral Malleolus o Mattress o Turn and reposition every 2 hours o Other: - do not put pressure on your right ankle Additional Orders / Instructions Wound #1 Right,Lateral Malleolus o Increase protein intake. Wound #2 Left,Lateral Malleolus o Increase protein intake. Home Health Wound #1 [place] Visits  - [place] Nurse may visit PRN to address patientos wound care needs. o FACE TO FACE ENCOUNTER: MEDICARE and MEDICAID PATIENTS: I certify that this patient is under my care and that I had a face-to-face encounter that meets the physician face-to-face encounter requirements with this patient on this date. The encounter with the patient was in whole or in part for the following MEDICAL CONDITION: (primary reason for [place]) MEDICAL NECESSITY: I certify, that based on my findings, NURSING services are a medically necessary  home health service. HOME BOUND STATUS: I certify that my clinical findings support that this patient is homebound (i.e., Due to illness or injury, pt requires aid of supportive devices such as crutches, cane, wheelchairs, walkers, the use of special transportation or the assistance of another person to leave their place of residence. There is a normal inability to leave the home and doing so requires considerable and taxing effort. Other absences are for medical reasons / religious services and are infrequent or of short duration when for other reasons). o If current dressing causes regression in wound condition, may D/C ordered dressing product/s and apply Normal Saline Moist Dressing daily until next [place] / Other MD appointment. [place] of regression in wound condition at [phone]. o Please direct any NON-WOUND related issues/requests for orders to patient's Primary Care Physician CAPTOLA, TESCHNER ([id number]) Wound #2 [place] Visits - [place] Nurse may visit PRN to address patientos wound care needs. o FACE TO FACE ENCOUNTER: MEDICARE and MEDICAID PATIENTS: I certify that this patient is under my care and that I had a face-to-face encounter that meets the physician face-to-face encounter requirements with this patient on this date. The encounter  with the patient was in whole or in part for the following MEDICAL CONDITION: (primary reason for [place]) MEDICAL NECESSITY: I certify, that based on my findings, NURSING services are a medically necessary home health service. HOME BOUND STATUS: I certify that my clinical findings support that this patient is homebound (i.e., Due to illness or injury, pt requires aid of supportive devices such as crutches, cane, wheelchairs, walkers, the use of special transportation or the assistance of another person to leave their place of residence. There is a normal inability to leave the home and doing so requires considerable and taxing effort. Other absences are for medical reasons / religious services and are infrequent or of short duration when for other reasons). o If current dressing causes regression in wound condition, may D/C ordered dressing product/s and apply Normal Saline Moist Dressing daily until next [place] / Other MD appointment. [place] of regression in wound condition at [phone]. o Please direct any NON-WOUND related issues/requests for orders to patient's Primary Care Physician Electronic Signature(s) Signed: 08/06/2018 5:51:08 PM By: Montey Hora Signed: 08/07/2018 11:11:19 AM By: Worthy Keeler PA-C Entered By: Montey Hora on 08/06/2018 10:23:53 Mckeever, [name] ([id number]) -------------------------------------------------------------------------------- Problem List Details Patient Name: [name] Date of Service: 08/06/2018 9:30 AM Medical Record Number: [id number] Patient Account Number: [id number] Date of Birth/Sex: [birth year] (84 y.o. 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, stage 4 05/10/2018 No Yes L89.524 Pressure ulcer of left ankle, stage 4 04/25/2018 No Yes I70.233 Atherosclerosis of native arteries of right leg with ulceration of 08/10/2017 No Yes ankle I70.243 Atherosclerosis of native arteries of left leg with ulceration of 07/10/2018 No Yes ankle I89.0 Lymphedema, not elsewhere classified 08/10/2017 No Yes B35.4 Tinea corporis 09/28/2017 No Yes Inactive Problems Resolved Problems Electronic Signature(s) Signed: 08/07/2018 11:11:19 AM By: Worthy Keeler PA-C Entered By: Worthy Keeler on 08/06/2018 10:05:13 Causer, [name] ([id number]) -------------------------------------------------------------------------------- Progress Note Details Patient Name: [name] Date of Service: 08/06/2018 9:30 AM Medical Record Number: [id number]  Patient Account Number: [id number] Date of Birth/Sex: 11/05/1923 (84 y.o. 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 Dr. Amalia Hailey the podiatrist saw her on 07/20/2017 and sent her to the wound center for possible hyperbaric oxygen therapy. past medical history of peripheral vascular disease, varicose veins, status post appendectomy, basal cell carcinoma excision from the left leg, cholecystectomy, pacemaker placement, right lower extremity angiography done by Dr. dew in March 2017 with placement of a stent. there is also note of a successful ablation of the right  small saphenous vein done which was reviewed by ultrasound on 10/24/2016. the patient had a right small saphenous vein ablation done on 10/20/2016. The patient has never been a smoker. She has been seen by Dr. Corene Cornea dew the vascular surgeon who most recently saw her on 06/15/2017 for evaluation of ongoing problems with right leg swelling. She had a lower extremity arterial duplex examination done(02/13/17) which showed patent distal right superficial femoral artery stent and above-the-knee popliteal stent without evidence of restenosis. The ABI was more than 1.3 on the right and more than 1.3 on the left. This was consistent with noncompressible arteries due to medial calcification. The right great toe pressure and PPG waveforms are within normal limits and the left great toe pressure and PPG waveforms are decreased. he recommended she continue to wear her compression stockings and continue with elevation. She is scheduled to have a noninvasive arterial study in the near future 08/16/2017 -- had a lower extremity arterial duplex examination done which showed patent distal right superficial femoral artery stent and above-the-knee popliteal stent without evidence of restenosis. The ABI was more than 1.3 on the right and more than 1.3 on the left. This was consistent with noncompressible arteries due to medial calcification. The right great toe pressure and PPG waveforms are within normal limits and the left great toe pressure and PPG waveforms are decreased. the x-ray of the right ankle has not yet been done 08/24/2017 -- had a right ankle x-ray -- IMPRESSION:1. No fracture, bone lesion or evidence of osteomyelitis. 2. Lateral soft tissue swelling with a soft tissue ulcer. she has not yet seen the vascular surgeon for review 08/31/17 on evaluation today patient's wound appears to be showing signs of improvement. She still with her appointment with vascular in order to review her results of her  vascular study and then determine if any intervention would be recommended at that time. No fevers, chills, nausea, or vomiting noted at this time. She has been tolerating the dressing changes without complication. 09/28/17 on evaluation today patient's wound appears to show signs of good improvement in regard to the granulation tissue which is surfacing. There is still a layer of slough covering the wound and the posterior portion is still significantly deeper than the anterior nonetheless there has been some good sign of things moving towards the better. She is going to go back to Dr. dew for reevaluation to ensure her blood flow is still appropriate. That will be before her next evaluation with [place] next week. No fevers, chills, nausea, or vomiting noted at this time. Patient  does have some discomfort rated to be a 3-4/10 depending on activity specifically cleansing the wound makes it worse. 10/05/2017 -- the patient was seen by Dr. Lucky Cowboy last week and noninvasive studies showed a normal right ABI with brisk CHRISSIE, DACQUISTO. ([id number]) triphasic waveforms consistent with no arterial insufficiency including normal digital pressures. The duplex showed a patent distal right SFA stent and the proximal SFA was also normal. He was pleased with her test and thought she should have enough of perfusion for normal wound healing. He would see her back in 6 months time. 12/21/17 on evaluation today patient appears to be doing fairly well in regard to her right lateral ankle wound. Unfortunately the main issue that she is expansion at this point is that she is having some issues with what appears to be some cellulitis in the right anterior shin. She has also been noting a little bit of uncomfortable feeling especially last night and her ankle area. I'm afraid that she made the developing a little bit of an infection. With that being said I think it is in the early stages. 12/28/17 on evaluation today patient's  ankle appears to be doing excellent. She's making good progress at this point the cellulitis seems to have improved after last week's evaluation. Overall she is having no significant discomfort which is excellent news. She does have an appointment with Dr. dew on March 29, 2018 for reevaluation in regard to the stent he placed. She seems to have excellent blood flow in the right lower extremity. 01/19/12 on evaluation today patient's wound appears to be doing very well. In fact she does not appear to require debridement at this point, there's no evidence of infection, and overall from the standpoint of the wound she seems to be doing very well. With that being said I believe that it may be time to switch to different dressing away from the [facility] Dressing she tells me she does have a lot going on her friend actually passed away yesterday and she's also having a lot of issues with her husband this obviously is weighing heavy on her as far as your thoughts and concerns today. 01/25/18 on evaluation today patient appears to be doing fairly well in regard to her right lateral malleolus. She has been tolerating the dressing changes without complication. Overall I feel like this is definitely showing signs of improvement as far as how the overall appearance of the wound is there's also evidence of epithelium start to migrate over the granulation tissue. In general I think that she is progressing nicely as far as the wound is concerned. The only concern she really has is whether or not we can switch to every other week visits in order to avoid having as many appointments as her daughters have a difficult time getting her to her appointments as well as the patient's husband to his he is not doing very well at this point. 02/22/18 on evaluation today patient's right lateral malleolus ulcer appears to be doing great. She has been tolerating the dressing changes without complication. Overall you making  excellent progress at this time. Patient is having no significant discomfort. 03/15/18 on evaluation today patient appears to be doing much more poorly in regard to her right lateral ankle ulcer at this point. Unfortunately since have last seen her her husband has passed just a few days ago is obviously weighed heavily on her her daughter also had surgery well she is with her today as usual. There does  not appear to be any evidence of infection she does seem to have significant contusion/deep tissue injury to the right lateral malleolus which was not noted previous when I saw her last. It's hard to tell of exactly when this injury occurred although during the time she was spending the night in the hospital this may have been most likely. 03/22/18 on evaluation today patient appears to actually be doing very well in regard to her ulcer. She did unfortunately have a setback which was noted last week however the good news is we seem to be getting back on track and in fact the wound in the core did still have some necrotic tissue which will be addressed at this point today but in general I'm seeing signs that things are on the up and up. She is glad to hear this obviously she's been somewhat concerned that due to the how her wound digressed more recently. 03/29/18 on evaluation today patient appears to be doing fairly well in regard to her right lower extremity lateral malleolus ulcer. She unfortunately does have a new area of pressure injury over the inferior portion where the wound has opened up a little bit larger secondary to the pressure she seems to be getting. She does tell me sometimes when she sleeps at night that it actually hurts and does seem to be pushing on the area little bit more unfortunately. There does not appear to be any evidence of infection which is good news. She has been tolerating the dressing changes without complication. She also did have some bruising in the left second and  third toes due to the fact that she may have bump this or injured it although she has neuropathy so she does not feel she did move recently that may have been where this came from. Nonetheless there does not appear to be any evidence of infection at this time. 04/12/18 on evaluation today patient's wound on the right lateral ankle actually appears to be doing a little bit better with a lot of necrotic docking tissue centrally loosening up in clearing away. However she does have the beginnings of a deep tissue injury on the left lateral malleolus likely due to the fact we've been trying offload the right as much as we have. I think she may benefit from an assistive soft device to help with offloading and it looks like they're looking at one of the doughnut conditions that wraps around the lower leg to offload which I think will definitely do a good job. With that being said I think we definitely need to address this issue on the left before it becomes a wound. Patient is not having significant pain. NICA, FRISKE ([id number]) 04/19/18 on evaluation today patient appears to be doing excellent in regard to the progress she's made with her right lateral ankle ulcer. The left ankle region which did show evidence of a deep tissue injury seems to be resolving there's little fluid noted underneath and a blister there's nothing open at this point in time overall I feel like this is progressing nicely which is good news. She does not seem to be having significant discomfort at this point which is also good news. 04/25/18-She is here in follow up evaluation for bilateral lateral malleolar ulcers. The right lateral malleolus ulcer with pale subcutaneous tissue exposure, central area of ulcer with tendon/periosteum exposed. The left lateral malleolus ulcer now with central area of nonviable tissue, otherwise deep tissue injury. She is wearing compression wraps to the  left lower extremity, she will place the  right lower extremity compression wraps on when she gets home. She will be out of town over the weekend and return next week and follow-up appointment. She completed her doxycycline this morning 05/03/18 on evaluation today patient appears to be doing very well in regard to her right lateral ankle ulcer in general. At least she's showing some signs of improvement in this regard. Unfortunately she has some additional injury to the left lateral malleolus region which appears to be new likely even over the past several days. Again this determination is based on the overall appearance. With that being said the patient is obviously frustrated about this currently. 05/10/18-She is here in follow-up evaluation for bilateral lateral malleolar ulcers. She states she has purchased offloading shoes/boots and they will arrive tomorrow. She was asked to bring them in the office at next week's appointment so her provider is aware of product being utilized. She continues to sleep on right or left side, she has been encouraged to sleep on her back. The right lateral malleolus ulcer is precariously close to peri-osteum; will order xray. The left lateral malleolus ulcer is improved. Will switch back to santyl; she will follow up next week. 05/17/18 on evaluation today patient actually appears to be doing very well in regard to her malleolus her ulcers compared to last time I saw them. She does not seem to have as much in the way of contusion at this point which is great news. With that being said she does continue to have discomfort and I do believe that she is still continuing to benefit from the offloading/pressure reducing boots that were recommended. I think this is the key to trying to get this to heal up completely. 05/24/18 on evaluation today patient actually appears to be doing worse at this point in time unfortunately compared to her last week's evaluation. She is having really no increased pain which is good  news unfortunately she does have more maceration in your theme and noted surrounding the right lateral ankle the left lateral ankle is not really is erythematous I do not see signs of the overt cellulitis on that side. Unfortunately the wounds do not seem to have shown any signs of improvement since the last evaluation. She also has significant swelling especially on the right compared to previous some of this may be due to infection however also think that she may be served better while she has these wounds by compression wrapping versus continuing to use the Juxta-Lite for the time being. Especially with the amount of drainage that she is experiencing at this point. No fevers, chills, nausea, or vomiting noted at this time. 05/31/18 on evaluation today patient appears to actually be doing better in regard to her right lateral lower extremity ulcer specifically on the malleolus region. She has been tolerating the antibiotic without complication. With that being said she still continues to have issues but a little bit of redness although nothing like she what she was experiencing previous. She still continues to pressure to her ankle area she did get the problem on offloading boots unfortunately she will not wear them she states there too uncomfortable and she can't get in and out of the bed. Nonetheless at this point her wounds seem to be continually getting worse which is not what we want I'm getting somewhat concerned about her progress and how things are going to proceed if we do not intervene in some way shape or form. I  therefore had a very lengthy conversation today about offloading yet again and even made a specific suggestion for switching her to a memory foam mattress and even gave the information for a specific one that they could look at getting if it was something that they were interested in considering. She does not want to be considered for a hospital bed air mattress although honestly  insurance would not cover it that she does not have any wounds on her trunk. 06/14/18 on evaluation today both wounds over the bilateral lateral malleolus her ulcers appear to be doing better there's no evidence of pressure injury at this point. She did get the foam mattress for her bed and this does seem to have been extremely beneficial for her in my pinion. Her daughter states that she is having difficulty getting out of bed because of how soft it is. The patient also relates this to be. Nonetheless I do feel like she's actually doing better. Unfortunately right after and around the time she was getting the mattress she also sustained a fall when she got up to go pick up the phone and ended up injuring her right elbow she has 18 sutures in place. We are not caring for this currently although home health is going to be taking the sutures out shortly. Nonetheless this may be something that we need to evaluate going forward. It depends on how well it has or has not healed in the end. She also recently saw an orthopedic specialist for an injection in the right shoulder just before her fall unfortunately the fall seems to have worsened her pain. 06/21/18 on evaluation today patient appears to be doing about the same in regard to her lateral malleolus ulcers. Both appear to be just a little bit deeper but again we are clinging away the necrotic and dead tissue which I think is why this is progressing towards a deeper realm as opposed to improving from my measurement standpoint in that regard. Nonetheless she has been tolerating the dressing changes she absolutely hates the memory foam mattress topper that was obtained for her nonetheless Aubree, Doody [name] ([id number]) I do believe this is still doing excellent as far as taking care of excess pressure in regard to the lateral malleolus regions. She in fact has no pressure injury that I see whereas in weeks past it was week by week I was constantly seeing  new pressure injuries. Overall I think it has been very beneficial for her. 07/03/18; patient arrives in my clinic today. She has deep punched out areas over her bilateral lateral malleoli. The area on the right has some more depth. We spent a lot of time today talking about pressure relief for these areas. This started when her daughter asked for a prescription for a memory foam mattress. I have never written a prescription for a mattress and I don't think insurances would pay for that on an ordinary bed. In any case he came up that she has foam boots that she refuses to wear. I would suggest going to these before any other offloading issues when she is in bed. They say she is meticulous about offloading this the rest of the day 07/10/18- She is seen in follow-up evaluation for bilateral, lateral malleolus ulcers. There is no improvement in the ulcers. She has purchased and is sleeping on a memory foam mattress/overlay, she has been using the offloading boots nightly over the past week. She has a follow up appointment with vascular medicine at the  end of October, in my opinion this follow up should be expedited given her deterioration and suboptimal TBI results. We will order plain film xray of the left ankle as deeper structures are palpable; would consider having MRI, regardless of xray report(s). The ulcers will be treated with iodoflex/iodosorb, she is unable to safely change the dressings daily with santyl. 07/19/18 on evaluation today patient appears to be doing in general visually well in regard to her bilateral lateral malleolus ulcers. She has been tolerating the dressing changes without complication which is good news. With that being said we did have an x-ray performed on 07/12/18 which revealed a slight loosen see in the lateral portion of the distal left fibula which may represent artifact but underline lytic destruction or osteomyelitis could not be excluded. MRI was recommended. With  that being said we can see about getting the patient scheduled for an MRI to further evaluate this area. In fact we have that scheduled currently for August 20 19,019. 07/26/18 on evaluation today patient's wound on the right lateral ankle actually appears to be doing fairly well at this point in my pinion. She has made some good progress currently. With that being said unfortunately in regard to the left lateral ankle ulcer this seems to be a little bit more problematic at this time. In fact as I further evaluated the situation she actually had bone exposed which is the first time that's been the case in the bone appear to be necrotic. Currently I did review patient's note from Dr. Bunnie Domino office with [facility]. He stated that ABI was 1.26 on the right and 0.95 on the left with good waveforms. Her perfusion is stable not reduced from previous studies and her digital waveforms were pretty good particularly on the right. His conclusion upon review of the note was that there was not much she could do to improve her perfusion and he felt she was adequate for wound healing. His suggestion was that she continued to see [place] and consider a synthetic skin graft if there was no underlying infection. He plans to see her back in six months or as needed. 08/01/18 on evaluation today patient appears to be doing better in regard to her right lateral ankle ulcer. Her left lateral ankle ulcer is about the same she still has bone involvement in evidence of necrosis. There does not appear to be evidence of infection at this time On the right lateral lower extremity. I have started her on the Augmentin she picked this up and started this yesterday. This is to get her through until she sees infectious disease which is scheduled for 08/12/18. 08/06/18 on evaluation today patient appears to be doing rather well considering my discussion with patient's daughter at the end of last week. The area which was  marked where she had erythema seems to be improved and this is good news. With that being said overall the patient seems to be making good improvement when it comes to the overall appearance of the right lateral ankle ulcer although this has been slow she at least is coming around in this regard. Unfortunately in regard to the left lateral ankle ulcer this is osteomyelitis based on the pathology report as well is bone culture. Nonetheless we are still waiting CT scan. Unfortunately the MRI we originally ordered cannot be performed as the patient is a pacemaker which I had overlooked. Nonetheless we are working on the CT scan approval and scheduling as of now. She  did go to the hospital over the weekend and was placed on IV Cefzo for a couple of days. Fortunately this seems to have improved the erythema quite significantly which is good news. There does not appear to be any evidence of worsening infection at this time. She did have some bleeding after the last debridement therefore I did not perform any sharp debridement in regard to left lateral ankle at this point. Patient has been approved for a snap vac for the right lateral ankle. Patient History Information obtained from Patient. Family History RICKITA, FORSTNER ([id number]) Cancer - Father,Siblings, Heart Disease - Siblings, No family history of Diabetes, Hereditary Spherocytosis, Hypertension, Kidney Disease, Lung Disease, Seizures, Stroke, Thyroid Problems, Tuberculosis. Social History Never smoker, Marital Status - Married, Alcohol Use - Never, Drug Use - No History, Caffeine Use - Rarely. Review of Systems (ROS) Constitutional Symptoms (General Health) Denies complaints or symptoms of Fever, Chills. Respiratory The patient has no complaints or symptoms. Cardiovascular Complains or has symptoms of LE edema. Psychiatric The patient has no complaints or symptoms. Objective Constitutional Well-nourished and well-hydrated in no  acute distress. Vitals Time Taken: 9:40 AM, Height: 65 in, Weight: 154.3 lbs, BMI: 25.7, Temperature: 98.1 F, Pulse: 78 bpm, Respiratory Rate: 18 breaths/min, Blood Pressure: 122/56 mmHg. Respiratory normal breathing without difficulty. Cardiovascular trace pitting edema of the bilateral lower extremities. Psychiatric this patient is able to make decisions and demonstrates good insight into disease process. Alert and Oriented x 3. pleasant and cooperative. General Notes: Patient's wound bed at this time on the right lateral ankle did require sharp debridement to remove slough and necrotic tissue from the surface of the wound of preparation for the Wound VAC application. Again we are using a snap vac. She tolerated the application and debridement without complication. In regard to the left lateral ankle no sharp debridement was performed she had discomfort even just with light mechanical debridement although she did not have any significant bleeding which was good news. Again when I debrided her wound last week she actually had some bleeding unfortunately for the next 24 hours. Integumentary (Hair, Skin) Wound #1 status is Open. Original cause of wound was Gradually Appeared. The wound is located on the Right,Lateral Malleolus. The wound measures 1.7cm length x 1.6cm width x 0.4cm depth; 2.136cm^2 area and 0.855cm^3 volume. There is Fat Layer (Subcutaneous Tissue) Exposed exposed. There is no tunneling or undermining noted. There is a medium amount of purulent drainage noted. Foul odor after cleansing was noted. The wound margin is distinct with the outline attached to the wound base. There is small (1-33%) pink granulation within the wound bed. There is a large (67-100%) amount of necrotic tissue within the wound bed including Eschar and Adherent Slough. The periwound skin appearance BRYTTANY, TORTORELLI ([id number]) exhibited: Maceration, Rubor, Erythema. The periwound skin appearance did  not exhibit: Callus, Crepitus, Excoriation, Induration, Rash, Scarring, Dry/Scaly, Atrophie Blanche, Cyanosis, Ecchymosis, Hemosiderin Staining, Mottled, Pallor. The surrounding wound skin color is noted with erythema which is circumferential. Periwound temperature was noted as No Abnormality. The periwound has tenderness on palpation. Wound #2 status is Open. Original cause of wound was Gradually Appeared. The wound is located on the Left,Lateral Malleolus. The wound measures 2.4cm length x 2cm width x 1cm depth; 3.77cm^2 area and 3.77cm^3 volume. There is Fat Layer (Subcutaneous Tissue) Exposed exposed. There is no tunneling or undermining noted. There is a large amount of purulent drainage noted. Foul odor after cleansing was noted. The wound margin is flat  and intact. There is small (1-33%) pink granulation within the wound bed. There is a large (67-100%) amount of necrotic tissue within the wound bed including Eschar and Adherent Slough. The periwound skin appearance exhibited: Maceration, Erythema. The periwound skin appearance did not exhibit: Callus, Crepitus, Excoriation, Induration, Rash, Scarring, Dry/Scaly, Atrophie Blanche, Cyanosis, Ecchymosis, Hemosiderin Staining, Mottled, Pallor, Rubor. The surrounding wound skin color is noted with erythema which is circumferential. Periwound temperature was noted as No Abnormality. The periwound has tenderness on palpation. 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 There was a Excisional Skin/Subcutaneous  Tissue Debridement with a total area of 2.72 sq cm performed by STONE III, HOYT E., PA-C. With the following instrument(s): Curette to remove Viable and Non-Viable tissue/material. Material removed includes Subcutaneous Tissue and Slough and after achieving pain control using Lidocaine 4% Topical Solution. No specimens were taken. A time out was conducted at 10:19, prior to the start of the procedure. A Minimum amount of bleeding was controlled with Pressure. The procedure was tolerated well with a pain level of 0 throughout and a pain level of 0 following the procedure. Patient s Level of Consciousness post procedure was recorded as Awake and Alert. Post Debridement Measurements: 1.7cm length x 1.6cm width x 0.5cm depth; 1.068cm^3 volume. Post debridement Stage noted as Category/Stage IV. Character of Wound/Ulcer Post Debridement is improved. Severity of Tissue Post Debridement is: Fat layer exposed. Post procedure Diagnosis Wound #1: Same as Pre-Procedure Plan Wound Cleansing: Dianely, Krehbiel [name] ([id number]) Wound #1 Right,Lateral Malleolus: Clean wound with Normal Saline. Cleanse wound with mild soap and water May Shower, gently pat wound dry prior to applying new dressing. Wound #2 Left,Lateral Malleolus: Clean wound with Normal Saline. Cleanse wound with mild soap and water May Shower, gently pat wound dry prior to applying new dressing. Anesthetic (add to Medication List): Wound #1 Right,Lateral Malleolus: Topical Lidocaine 4% cream applied to wound bed prior to debridement (In Clinic Only). Wound #2 Left,Lateral Malleolus: Topical Lidocaine 4% cream applied to wound bed prior to debridement (In Clinic Only). Primary Wound Dressing: Wound #2 Left,Lateral Malleolus: Iodoflex Secondary Dressing: Wound #2 Left,Lateral Malleolus: Boardered Foam Dressing Dressing Change Frequency: Wound #1 Right,Lateral Malleolus: Change dressing every week - HHRN may remove SNAP vac and use  iodoflex and bordered foam dressing if SNAP vac fills up or becomes soiled Wound #2 Left,Lateral Malleolus: Change Dressing Monday, Wednesday, Friday Follow-up Appointments: Wound #1 Right,Lateral Malleolus: Return Appointment in 1 week. Wound #2 Left,Lateral Malleolus: Return Appointment in 1 week. Edema Control: Wound #1 Right,Lateral Malleolus: Patient to wear own Velcro compression garment. Elevate legs to the level of the heart and pump ankles as often as possible Wound #2 Left,Lateral Malleolus: Patient to wear own Velcro compression garment. Elevate legs to the level of the heart and pump ankles as often as possible Off-Loading: Wound #1 Right,Lateral Malleolus: Mattress Turn and reposition every 2 hours Other: - do not put pressure on your right ankle Wound #2 Left,Lateral Malleolus: Mattress Turn and reposition every 2 hours Other: - do not put pressure on your right ankle Additional Orders / Instructions: Wound #1 Right,Lateral Malleolus: Increase  protein intake. Wound #2 Left,Lateral Malleolus: Increase protein intake. Home Health: Wound #1 Right,Lateral Malleolus: [place] Visits - [place] Nurse may visit PRN to address patient s wound care needs. FACE TO FACE ENCOUNTER: MEDICARE and MEDICAID PATIENTS: I certify that this patient is under my care and that I had a face-to-face encounter that meets the physician face-to-face encounter requirements with this patient on this date. The encounter with the patient was in whole or in part for the following MEDICAL CONDITION: (primary reason for [place]) MEDICAL NECESSITY: I certify, that based on my findings, NURSING services are a medically necessary home health service. HOME BOUND STATUS: I certify that my clinical findings support that this patient is homebound (i.e., Due to ALIVIAH, SPAIN ([id number]) illness or injury, pt requires aid of supportive devices such as crutches, cane,  wheelchairs, walkers, the use of special transportation or the assistance of another person to leave their place of residence. There is a normal inability to leave the home and doing so requires considerable and taxing effort. Other absences are for medical reasons / religious services and are infrequent or of short duration when for other reasons). If current dressing causes regression in wound condition, may D/C ordered dressing product/s and apply Normal Saline Moist Dressing daily until next [place] / Other MD appointment. [place] of regression in wound condition at [phone]. Please direct any NON-WOUND related issues/requests for orders to patient's Primary Care Physician Wound #2 Left,Lateral Malleolus: [place] Visits - [place] Nurse may visit PRN to address patient s wound care needs. FACE TO FACE ENCOUNTER: MEDICARE and MEDICAID PATIENTS: I certify that this patient is under my care and that I had a face-to-face encounter that meets the physician face-to-face encounter requirements with this patient on this date. The encounter with the patient was in whole or in part for the following MEDICAL CONDITION: (primary reason for [place]) MEDICAL NECESSITY: I certify, that based on my findings, NURSING services are a medically necessary home health service. HOME BOUND STATUS: I certify that my clinical findings support that this patient is homebound (i.e., Due to illness or injury, pt requires aid of supportive devices such as crutches, cane, wheelchairs, walkers, the use of special transportation or the assistance of another person to leave their place of residence. There is a normal inability to leave the home and doing so requires considerable and taxing effort. Other absences are for medical reasons / religious services and are infrequent or of short duration when for other reasons). If current dressing causes regression  in wound condition, may D/C ordered dressing product/s and apply Normal Saline Moist Dressing daily until next [place] / Other MD appointment. [place] of regression in wound condition at [phone]. Please direct any NON-WOUND related issues/requests for orders to patient's Primary Care Physician Patient's wound bad at this point shows evidence of good improvement in regard to the right lateral ankle I'm gonna recommend that we continue with managing this through traditional wound care measures and we did initiate the snap vac today. With that being said we're going to see her back in one week for reevaluation to see were things stand. In regard to left lateral ankle we will continue with the Iodoflex currently. Will see were things stand at follow-up. We're gonna contact infectious disease as well to ensure they have the updated pathology report showing osteomyelitis as well as the bone culture as well.  We're also still working on the approval for the CT scan. Patient and her daughter in agreement with plan. Please see above for specific wound care orders. We will see patient for re-evaluation in 1 week(s) here in the clinic. If anything worsens or changes patient will contact our office for additional recommendations. Electronic Signature(s) Signed: 08/07/2018 11:11:19 AM By: Worthy Keeler PA-C Entered By: Worthy Keeler on 08/06/2018 10:44:10 Muldrew, [name] ([id number]) -------------------------------------------------------------------------------- ROS/PFSH Details Patient Name: [name] Date of Service: 08/06/2018 9:30 AM Medical Record Number: [id number] Patient Account Number: [id number] Date of Birth/Sex: 06-16-1923 (84 y.o. F) Treating RN: Montey Hora Primary Care Provider: Grayland Ormond Other Clinician: Referring Provider: Grayland Ormond Treating Provider/Extender: Melburn Hake, HOYT Weeks in Treatment: 25 Information Obtained  From Patient Wound History Do you currently have one or more open woundso Yes How many open wounds do you currently haveo 1 Approximately how long have you had your woundso 2 yrs How have you been treating your wound(s) until nowo mupirocin, soaking in epsom salt Has your wound(s) ever healed and then re-openedo No Have you had any lab work done in the past montho No Have you tested positive for an antibiotic resistant organism (MRSA, VRE)o No Have you tested positive for osteomyelitis (bone infection)o No Have you had any tests for circulation on your legso Yes Who ordered the testo Dr. Lucky Cowboy [facility] the test [facility] Constitutional Symptoms (General Health) Complaints and Symptoms: Negative for: Fever; Chills Cardiovascular Complaints and Symptoms: Positive for: LE edema Medical History: Positive for: Congestive Heart Failure; Hypertension Eyes Medical History: Positive for: Cataracts - surgery Respiratory Complaints and Symptoms: No Complaints or Symptoms Musculoskeletal Medical History: Positive for: Osteoarthritis Neurologic Medical History: Positive for: Neuropathy Oncologic MERSADIE, KAVANAUGH ([id number]) Medical History: Negative for: Received Chemotherapy; Received Radiation Psychiatric Complaints and Symptoms: No Complaints or Symptoms HBO Extended History Items Eyes: Cataracts Immunizations Pneumococcal Vaccine: Received Pneumococcal Vaccination: Yes Implantable Devices Family and Social History Cancer: Yes - Father,Siblings; Diabetes: No; Heart Disease: Yes - Siblings; Hereditary Spherocytosis: No; Hypertension: No; Kidney Disease: No; Lung Disease: No; Seizures: No; Stroke: No; Thyroid Problems: No; Tuberculosis: No; Never smoker; Marital Status - Married; Alcohol Use: Never; Drug Use: No History; Caffeine Use: Rarely; Financial Concerns: No; Food, Clothing or Shelter Needs: No; Support System Lacking: No; Transportation Concerns: No; Advanced  Directives: No; Patient does not want information on Advanced Directives; Do not resuscitate: No; Living Will: Yes (Not Provided); Medical Power of Attorney: No Physician Affirmation I have reviewed and agree with the above information. Electronic Signature(s) Signed: 08/06/2018 5:51:08 PM By: Montey Hora Signed: 08/07/2018 11:11:19 AM By: Worthy Keeler PA-C Entered By: Worthy Keeler on 08/06/2018 10:42:09 Christoffersen, [name] ([id number]) -------------------------------------------------------------------------------- SuperBill Details Patient Name: [name] Date of Service: 08/06/2018 Medical Record Number: [id number] Patient Account Number: [id number] Date of Birth/Sex: 15-Nov-1923 (84 y.o. 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 I70.243 Atherosclerosis of native arteries of left leg with ulceration of ankle I89.0 Lymphedema, not elsewhere classified B35.4 Tinea corporis Facility Procedures CPT4 Code: 02725366 Description: 44034 - DEB SUBQ TISSUE 20 SQ CM/< ICD-10 Diagnosis Description 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, stage 4 Modifier:  Quantity: 1 Physician Procedures CPT4 Code: 2703500 Description: 93818 - WC PHYS LEVEL 4 - EST PT ICD-10 Diagnosis Description 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, stage 4 L89.524 Pressure ulcer of left ankle, stage 4 I70.233 Atherosclerosis of native arteries of right leg with ulceration I70.243  Atherosclerosis of native arteries of left leg with ulceration Modifier: 25 of ankle of ankle Quantity: 1 CPT4 Code: 2993716 Description: 96789 - WC PHYS SUBQ TISS 20  SQ CM ICD-10 Diagnosis Description 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, stage 4 Modifier: Quantity: 1 Electronic Signature(s) Signed: 08/07/2018 11:11:19 AM By: Worthy Keeler PA-C Entered By: Worthy Keeler on 08/06/2018 10:44:42

## 2018-08-08 NOTE — Progress Notes (Signed)
[name], [name] ([id number]) Visit Report for 08/01/2018 Chief Complaint Document Details Patient Name: [name], [name]. Date of Service: 08/01/2018 3:00 PM Medical Record Number: [id number] Patient Account Number: [id number] Date of Birth/Sex: 07-13-1923 (84 y.o. F) Treating RN: Ahmed Prima Primary Care Provider: Grayland Ormond Other Clinician: Referring Provider: Grayland Ormond Treating Provider/Extender: Melburn Hake, HOYT Weeks in Treatment: 62 Information Obtained from: Patient Chief Complaint Patient is here for right lateral malleolus and left lateral malleolus ulcer Electronic Signature(s) Signed: 08/01/2018 5:16:12 PM By: Worthy Keeler PA-C Entered By: Worthy Keeler on 08/01/2018 15:22:58 Grzelak, [name] ([id number]) -------------------------------------------------------------------------------- Debridement Details Patient Name: [name] Date of Service: 08/01/2018 3:00 PM Medical Record Number: [id number] Patient Account Number: [id number] Date of Birth/Sex: Jan 29, 1923 (84 y.o. F) Treating RN: Montey Hora Primary Care Provider: Grayland Ormond Other Clinician: Referring Provider: Grayland Ormond Treating Provider/Extender: Melburn Hake, HOYT Weeks in Treatment: 50 Debridement Performed for Wound #1 Right,Lateral Malleolus Assessment: Performed By: Physician STONE III, HOYT E., PA-C Debridement Type: Debridement Severity of Tissue Pre Fat layer exposed Debridement: Pre-procedure Verification/Time Yes - 16:17 Out Taken: Start Time: 16:17 Pain Control: Lidocaine 4% Topical Solution Total Area Debrided (L x W): 1.7 (cm) x 1.6 (cm) = 2.72 (cm) Tissue and other material Viable, Non-Viable, Slough, Subcutaneous, Slough debrided: Level: Skin/Subcutaneous Tissue Debridement Description: Excisional Instrument: Curette Bleeding: Minimum Hemostasis Achieved: Pressure End Time: 16:19 Procedural Pain: 0 Post Procedural Pain: 0 Response to  Treatment: Procedure was tolerated well Level of Consciousness: Awake and Alert Post Debridement Measurements of Total Wound Length: (cm) 1.7 Stage: Category/Stage IV Width: (cm) 1.6 Depth: (cm) 0.5 Volume: (cm) 1.068 Character of Wound/Ulcer Post Improved Debridement: Severity of Tissue Post Debridement: Fat layer exposed Post Procedure Diagnosis Same as Pre-procedure Electronic Signature(s) Signed: 08/01/2018 4:56:21 PM By: Montey Hora Signed: 08/01/2018 5:16:12 PM By: Worthy Keeler PA-C Entered By: Montey Hora on 08/01/2018 16:19:02 [place], [name] ([id number]) -------------------------------------------------------------------------------- Debridement Details Patient Name: [name] Date of Service: 08/01/2018 3:00 PM Medical Record Number: [id number] Patient Account Number: [id number] Date of Birth/Sex: 04-Mar-1923 (84 y.o. F) Treating RN: Montey Hora Primary Care Provider: Grayland Ormond Other Clinician: Referring Provider: Grayland Ormond Treating Provider/Extender: Melburn Hake, HOYT Weeks in Treatment: 50 Debridement Performed for Wound #2 Left,Lateral Malleolus Assessment: Performed By: Physician STONE III, HOYT E., PA-C Debridement Type: Debridement Severity of Tissue Pre Fat layer exposed Debridement: Pre-procedure Verification/Time Yes - 16:19 Out Taken: Start Time: 16:19 Pain Control: Lidocaine 4% Topical Solution Total Area Debrided (L x W): 1.9 (cm) x 1.6 (cm) = 3.04 (cm) Tissue and other material Viable, Non-Viable, Bone, Slough, Subcutaneous, Slough debrided: Level: Skin/Subcutaneous Tissue/Muscle/Bone Debridement Description: Excisional Instrument: Curette Bleeding: Minimum Hemostasis Achieved: Pressure End Time: 16:26 Procedural Pain: 0 Post Procedural Pain: 0 Response to Treatment: Procedure was tolerated well Level of Consciousness: Awake and Alert Post Debridement Measurements of Total Wound Length: (cm) 1.9 Stage:  Category/Stage IV Width: (cm) 1.6 Depth: (cm) 0.6 Volume: (cm) 1.433 Character of Wound/Ulcer Post Improved Debridement: Severity of Tissue Post Debridement: Fat layer exposed Post Procedure Diagnosis Same as Pre-procedure Electronic Signature(s) Signed: 08/01/2018 4:56:21 PM By: Montey Hora Signed: 08/01/2018 5:16:12 PM By: Worthy Keeler PA-C Entered By: Montey Hora on 08/01/2018 16:26:23 [place]ur[place], [name] ([id number]) -------------------------------------------------------------------------------- HPI Details Patient Name: [name] Date of Service: 08/01/2018 3:00 PM Medical Record Number: [id number] Patient Account Number: [id number] Date of Birth/Sex: 1923-11-23 (84 y.o. 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 Dr. Amalia Hailey the podiatrist saw her on 07/20/2017 and sent her to the wound center for possible hyperbaric oxygen therapy. past medical history of peripheral vascular disease, varicose veins, status post appendectomy, basal cell carcinoma excision from the left leg, cholecystectomy, pacemaker placement, right lower extremity angiography done by Dr. dew in March 2017 with placement of a stent. there is also note of a successful ablation of the right small saphenous vein done which was reviewed by ultrasound on 10/24/2016. the patient had a right small saphenous vein ablation done on 10/20/2016. The patient has never been a smoker. She has been seen by Dr. Corene Cornea dew the vascular surgeon who most recently saw her on 06/15/2017 for evaluation of ongoing problems with right  leg swelling. She had a lower extremity arterial duplex examination done(02/13/17) which showed patent distal right superficial femoral artery stent and above-the-knee popliteal stent without evidence of restenosis. The ABI was more than 1.3 on the right and more than 1.3 on the left. This was consistent with noncompressible arteries due to medial calcification. The right great toe pressure and PPG waveforms are within normal limits and the left great toe pressure and PPG waveforms are decreased. he recommended she continue to wear her compression stockings and continue with elevation. She is scheduled to have a noninvasive arterial study in the near future 08/16/2017 -- had a lower extremity arterial duplex examination done which showed patent distal right superficial femoral artery stent and above-the-knee popliteal stent without evidence of restenosis. The ABI was more than 1.3 on the right and more than 1.3 on the left. This was consistent with noncompressible arteries due to medial calcification. The right great toe pressure and PPG waveforms are within normal limits and the left great toe pressure and PPG waveforms are decreased. the x-ray of the right ankle has not yet been done 08/24/2017 -- had a right ankle x-ray -- IMPRESSION:1. No fracture, bone lesion or evidence of osteomyelitis. 2. Lateral soft tissue swelling with a soft tissue ulcer. she has not yet seen the vascular surgeon for review 08/31/17 on evaluation today patient's wound appears to be showing signs of improvement. She still with her appointment with vascular in order to review her results of her vascular study and then determine if any intervention would be recommended at that time. No fevers, chills, nausea, or vomiting noted at this time. She has been tolerating the dressing changes without complication. 09/28/17 on evaluation today patient's wound appears to show signs of good improvement in regard to the granulation  tissue which is surfacing. There is still a layer of slough covering the wound and the posterior portion is still significantly deeper than the anterior nonetheless there has been some good sign of things moving towards the better. She is going to go back to Dr. dew for reevaluation to ensure her blood flow is still appropriate. That will be before her next evaluation with [place] next week. No fevers, chills, nausea, or vomiting noted at this time. Patient does have some discomfort rated to be a 3-4/10 depending on activity specifically cleansing the wound makes it worse. 10/05/2017 -- the patient was seen by Dr. Lucky Cowboy last week and noninvasive studies showed a normal right ABI with brisk triphasic  waveforms consistent with no arterial insufficiency including normal digital pressures. The duplex showed a patent distal right SFA stent and the proximal SFA was also normal. He was pleased with her test and thought she should have enough of perfusion for normal wound healing. He would see her back in 6 months time. 12/21/17 on evaluation today patient appears to be doing fairly well in regard to her right lateral ankle wound. Unfortunately the main issue that she is expansion at this point is that she is having some issues with what appears to be some cellulitis in the SORA, VROOMAN. ([id number]) right anterior shin. She has also been noting a little bit of uncomfortable feeling especially last night and her ankle area. I'm afraid that she made the developing a little bit of an infection. With that being said I think it is in the early stages. 12/28/17 on evaluation today patient's ankle appears to be doing excellent. She's making good progress at this point the cellulitis seems to have improved after last week's evaluation. Overall she is having no significant discomfort which is excellent news. She does have an appointment with Dr. dew on March 29, 2018 for reevaluation in regard to the stent he placed.  She seems to have excellent blood flow in the right lower extremity. 01/19/12 on evaluation today patient's wound appears to be doing very well. In fact she does not appear to require debridement at this point, there's no evidence of infection, and overall from the standpoint of the wound she seems to be doing very well. With that being said I believe that it may be time to switch to different dressing away from the [facility] Dressing she tells me she does have a lot going on her friend actually passed away yesterday and she's also having a lot of issues with her husband this obviously is weighing heavy on her as far as your thoughts and concerns today. 01/25/18 on evaluation today patient appears to be doing fairly well in regard to her right lateral malleolus. She has been tolerating the dressing changes without complication. Overall I feel like this is definitely showing signs of improvement as far as how the overall appearance of the wound is there's also evidence of epithelium start to migrate over the granulation tissue. In general I think that she is progressing nicely as far as the wound is concerned. The only concern she really has is whether or not we can switch to every other week visits in order to avoid having as many appointments as her daughters have a difficult time getting her to her appointments as well as the patient's husband to his he is not doing very well at this point. 02/22/18 on evaluation today patient's right lateral malleolus ulcer appears to be doing great. She has been tolerating the dressing changes without complication. Overall you making excellent progress at this time. Patient is having no significant discomfort. 03/15/18 on evaluation today patient appears to be doing much more poorly in regard to her right lateral ankle ulcer at this point. Unfortunately since have last seen her her husband has passed just a few days ago is obviously weighed heavily on her her  daughter also had surgery well she is with her today as usual. There does not appear to be any evidence of infection she does seem to have significant contusion/deep tissue injury to the right lateral malleolus which was not noted previous when I saw her last. It's hard to tell of exactly when this injury  occurred although during the time she was spending the night in the hospital this may have been most likely. 03/22/18 on evaluation today patient appears to actually be doing very well in regard to her ulcer. She did unfortunately have a setback which was noted last week however the good news is we seem to be getting back on track and in fact the wound in the core did still have some necrotic tissue which will be addressed at this point today but in general I'm seeing signs that things are on the up and up. She is glad to hear this obviously she's been somewhat concerned that due to the how her wound digressed more recently. 03/29/18 on evaluation today patient appears to be doing fairly well in regard to her right lower extremity lateral malleolus ulcer. She unfortunately does have a new area of pressure injury over the inferior portion where the wound has opened up a little bit larger secondary to the pressure she seems to be getting. She does tell me sometimes when she sleeps at night that it actually hurts and does seem to be pushing on the area little bit more unfortunately. There does not appear to be any evidence of infection which is good news. She has been tolerating the dressing changes without complication. She also did have some bruising in the left second and third toes due to the fact that she may have bump this or injured it although she has neuropathy so she does not feel she did move recently that may have been where this came from. Nonetheless there does not appear to be any evidence of infection at this time. 04/12/18 on evaluation today patient's wound on the right lateral ankle  actually appears to be doing a little bit better with a lot of necrotic docking tissue centrally loosening up in clearing away. However she does have the beginnings of a deep tissue injury on the left lateral malleolus likely due to the fact we've been trying offload the right as much as we have. I think she may benefit from an assistive soft device to help with offloading and it looks like they're looking at one of the doughnut conditions that wraps around the lower leg to offload which I think will definitely do a good job. With that being said I think we definitely need to address this issue on the left before it becomes a wound. Patient is not having significant pain. 04/19/18 on evaluation today patient appears to be doing excellent in regard to the progress she's made with her right lateral ankle ulcer. The left ankle region which did show evidence of a deep tissue injury seems to be resolving there's little fluid noted underneath and a blister there's nothing open at this point in time overall I feel like this is progressing nicely which is good news. She does not seem to be having significant discomfort at this point which is also good news. 04/25/18-She is here in follow up evaluation for bilateral lateral malleolar ulcers. The right lateral malleolus ulcer with pale subcutaneous tissue exposure, central area of ulcer with tendon/periosteum exposed. The left lateral malleolus ulcer now with Kretchmer, [name] ([id number]) central area of nonviable tissue, otherwise deep tissue injury. She is wearing compression wraps to the left lower extremity, she will place the right lower extremity compression wraps on when she gets home. She will be out of town over the weekend and return next week and follow-up appointment. She completed her doxycycline this morning 05/03/18 on  evaluation today patient appears to be doing very well in regard to her right lateral ankle ulcer in general. At least she's  showing some signs of improvement in this regard. Unfortunately she has some additional injury to the left lateral malleolus region which appears to be new likely even over the past several days. Again this determination is based on the overall appearance. With that being said the patient is obviously frustrated about this currently. 05/10/18-She is here in follow-up evaluation for bilateral lateral malleolar ulcers. She states she has purchased offloading shoes/boots and they will arrive tomorrow. She was asked to bring them in the office at next week's appointment so her provider is aware of product being utilized. She continues to sleep on right or left side, she has been encouraged to sleep on her back. The right lateral malleolus ulcer is precariously close to peri-osteum; will order xray. The left lateral malleolus ulcer is improved. Will switch back to santyl; she will follow up next week. 05/17/18 on evaluation today patient actually appears to be doing very well in regard to her malleolus her ulcers compared to last time I saw them. She does not seem to have as much in the way of contusion at this point which is great news. With that being said she does continue to have discomfort and I do believe that she is still continuing to benefit from the offloading/pressure reducing boots that were recommended. I think this is the key to trying to get this to heal up completely. 05/24/18 on evaluation today patient actually appears to be doing worse at this point in time unfortunately compared to her last week's evaluation. She is having really no increased pain which is good news unfortunately she does have more maceration in your theme and noted surrounding the right lateral ankle the left lateral ankle is not really is erythematous I do not see signs of the overt cellulitis on that side. Unfortunately the wounds do not seem to have shown any signs of improvement since the last evaluation. She also has  significant swelling especially on the right compared to previous some of this may be due to infection however also think that she may be served better while she has these wounds by compression wrapping versus continuing to use the Juxta-Lite for the time being. Especially with the amount of drainage that she is experiencing at this point. No fevers, chills, nausea, or vomiting noted at this time. 05/31/18 on evaluation today patient appears to actually be doing better in regard to her right lateral lower extremity ulcer specifically on the malleolus region. She has been tolerating the antibiotic without complication. With that being said she still continues to have issues but a little bit of redness although nothing like she what she was experiencing previous. She still continues to pressure to her ankle area she did get the problem on offloading boots unfortunately she will not wear them she states there too uncomfortable and she can't get in and out of the bed. Nonetheless at this point her wounds seem to be continually getting worse which is not what we want I'm getting somewhat concerned about her progress and how things are going to proceed if we do not intervene in some way shape or form. I therefore had a very lengthy conversation today about offloading yet again and even made a specific suggestion for switching her to a memory foam mattress and even gave the information for a specific one that they could look at getting if  it was something that they were interested in considering. She does not want to be considered for a hospital bed air mattress although honestly insurance would not cover it that she does not have any wounds on her trunk. 06/14/18 on evaluation today both wounds over the bilateral lateral malleolus her ulcers appear to be doing better there's no evidence of pressure injury at this point. She did get the foam mattress for her bed and this does seem to have been extremely  beneficial for her in my pinion. Her daughter states that she is having difficulty getting out of bed because of how soft it is. The patient also relates this to be. Nonetheless I do feel like she's actually doing better. Unfortunately right after and around the time she was getting the mattress she also sustained a fall when she got up to go pick up the phone and ended up injuring her right elbow she has [address] sut[address] place. We are not caring for this currently although home health is going to be taking the sutures out shortly. Nonetheless this may be something that we need to evaluate going forward. It depends on how well it has or has not healed in the end. She also recently saw an orthopedic specialist for an injection in the right shoulder just before her fall unfortunately the fall seems to have worsened her pain. 06/21/18 on evaluation today patient appears to be doing about the same in regard to her lateral malleolus ulcers. Both appear to be just a little bit deeper but again we are clinging away the necrotic and dead tissue which I think is why this is progressing towards a deeper realm as opposed to improving from my measurement standpoint in that regard. Nonetheless she has been tolerating the dressing changes she absolutely hates the memory foam mattress topper that was obtained for her nonetheless I do believe this is still doing excellent as far as taking care of excess pressure in regard to the lateral malleolus regions. She in fact has no pressure injury that I see whereas in weeks past it was week by week I was constantly seeing new pressure injuries. Overall I think it has been very beneficial for her. 07/03/18; patient arrives in my clinic today. She has deep punched out areas over her bilateral lateral malleoli. The area on the right has some more depth. MERIAN, WROE ([id number]) We spent a lot of time today talking about pressure relief for these areas. This started when  her daughter asked for a prescription for a memory foam mattress. I have never written a prescription for a mattress and I don't think insurances would pay for that on an ordinary bed. In any case he came up that she has foam boots that she refuses to wear. I would suggest going to these before any other offloading issues when she is in bed. They say she is meticulous about offloading this the rest of the day 07/10/18- She is seen in follow-up evaluation for bilateral, lateral malleolus ulcers. There is no improvement in the ulcers. She has purchased and is sleeping on a memory foam mattress/overlay, she has been using the offloading boots nightly over the past week. She has a follow up appointment with vascular medicine at the end of October, in my opinion this follow up should be expedited given her deterioration and suboptimal TBI results. We will order plain film xray of the left ankle as deeper structures are palpable; would consider having MRI, regardless of xray  report(s). The ulcers will be treated with iodoflex/iodosorb, she is unable to safely change the dressings daily with santyl. 07/19/18 on evaluation today patient appears to be doing in general visually well in regard to her bilateral lateral malleolus ulcers. She has been tolerating the dressing changes without complication which is good news. With that being said we did have an x-ray performed on 07/12/18 which revealed a slight loosen see in the lateral portion of the distal left fibula which may represent artifact but underline lytic destruction or osteomyelitis could not be excluded. MRI was recommended. With that being said we can see about getting the patient scheduled for an MRI to further evaluate this area. In fact we have that scheduled currently for August 20 19,019. 07/26/18 on evaluation today patient's wound on the right lateral ankle actually appears to be doing fairly well at this point in my pinion. She has made some good  progress currently. With that being said unfortunately in regard to the left lateral ankle ulcer this seems to be a little bit more problematic at this time. In fact as I further evaluated the situation she actually had bone exposed which is the first time that's been the case in the bone appear to be necrotic. Currently I did review patient's note from Dr. Bunnie Domino office with [facility]. He stated that ABI was 1.26 on the right and 0.95 on the left with good waveforms. Her perfusion is stable not reduced from previous studies and her digital waveforms were pretty good particularly on the right. His conclusion upon review of the note was that there was not much she could do to improve her perfusion and he felt she was adequate for wound healing. His suggestion was that she continued to see [place] and consider a synthetic skin graft if there was no underlying infection. He plans to see her back in six months or as needed. 08/01/18 on evaluation today patient appears to be doing better in regard to her right lateral ankle ulcer. Her left lateral ankle ulcer is about the same she still has bone involvement in evidence of necrosis. There does not appear to be evidence of infection at this time On the right lateral lower extremity. I have started her on the Augmentin she picked this up and started this yesterday. This is to get her through until she sees infectious disease which is scheduled for 08/12/18. Electronic Signature(s) Signed: 08/01/2018 5:16:12 PM By: Worthy Keeler PA-C Entered By: Worthy Keeler on 08/01/2018 17:01:20 For[place], [name] ([id number]) -------------------------------------------------------------------------------- Physical Exam Details Patient Name: [name] Date of Service: 08/01/2018 3:00 PM Medical Record Number: [id number] Patient Account Number: [id number] Date of Birth/Sex: 1923/09/23 (84 y.o. F) Treating RN: Ahmed Prima Primary Care  Provider: Grayland Ormond Other Clinician: Referring Provider: Grayland Ormond Treating Provider/Extender: STONE III, HOYT Weeks in Treatment: 81 Constitutional Well-nourished and well-hydrated in no acute distress. Respiratory normal breathing without difficulty. clear to auscultation bilaterally. Cardiovascular regular rate and rhythm with normal S1, S2. Psychiatric this patient is able to make decisions and demonstrates good insight into disease process. Alert and Oriented x 3. pleasant and cooperative. Notes Patient's wound bed currently shows evidence of good granulation with some Slough noted on the right lateral ankle ulcer. The left lateral ankle ulcer actually did have still some necrotic bone noted which was sharply debrided today. Unfortunately post debridement the patient had a little bit more bleeding then what was noted last week. Nonetheless I do feel  like I got more the necrotic bone off that I was able to achieve during last week's office visit. Hemostasis was achieved with the use of silver nitrate today. Electronic Signature(s) Signed: 08/01/2018 5:16:12 PM By: Worthy Keeler PA-C Entered By: Worthy Keeler on 08/01/2018 17:02:02 [place], [name] ([id number]) -------------------------------------------------------------------------------- Physician Orders Details Patient Name: [name] Date of Service: 08/01/2018 3:00 PM Medical Record Number: [id number] Patient Account Number: [id number] Date of Birth/Sex: [birth year] (84 y.o. 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 o Cleanse wound with mild soap and water o May Shower, gently pat wound dry prior to applying new dressing. Wound #2 Left,Lateral Malleolus o Clean wound with Normal Saline. o Cleanse wound with mild soap and water o May Shower, gently pat wound dry prior to applying new dressing. Anesthetic (add to Medication List) Wound #1 Right,Lateral Malleolus o Topical Lidocaine 4% cream applied to wound bed prior to debridement (In Clinic Only). Wound #2 Left,Lateral Malleolus o Topical Lidocaine 4% cream applied to wound bed prior to debridement (In Clinic Only). Primary Wound Dressing Wound #1 Right,Lateral Malleolus o Iodoflex Wound #2 Left,Lateral Malleolus o Iodoflex Secondary Dressing Wound #1 Right,Lateral Malleolus o Boardered Foam Dressing Wound #2 Left,Lateral Malleolus o Boardered Foam Dressing SONAM, WANDEL ([id number]) Dressing Change Frequency Wound #1 Right,Lateral Malleolus o Change Dressing Monday, Wednesday, Friday Wound #2 Left,Lateral Malleolus o Change Dressing Monday, Wednesday, Friday Follow-up Appointments Wound #1 Right,Lateral Malleolus o Return Appointment in 1 week. Wound #2 Left,Lateral Malleolus o Return Appointment in 1 week. Edema Control Wound #1 Right,Lateral Malleolus o Patient to wear own Velcro compression garment. o Elevate legs to the level of the heart and pump ankles as often as possible Wound #2 Left,Lateral Malleolus o Patient to wear own Velcro compression garment. o Elevate legs to the level of the heart and pump ankles as often as possible Off-Loading Wound #1 Right,Lateral Malleolus o Mattress o Turn and reposition every 2 hours o Other: - do not  put pressure on your right ankle Wound #2 Left,Lateral Malleolus o Mattress o Turn and reposition every 2 hours o Other: - do not put pressure on your right ankle Additional Orders / Instructions Wound #1 Right,Lateral Malleolus o Increase protein intake. Wound #2 Left,Lateral Malleolus o Increase protein intake. Home Health Wound #1 [place] Visits - Rosa Sanchez Nurse may visit PRN to address patientos wound care needs. o FACE TO FACE ENCOUNTER: MEDICARE and MEDICAID PATIENTS: I certify that this patient is under my care and that I had a face-to-face encounter that meets the physician face-to-face encounter requirements with this patient on this date. The encounter with the patient was in whole or in part for the following MEDICAL CONDITION: (primary reason for [place]) MEDICAL NECESSITY: I certify, that based on my findings, NURSING services are a medically necessary home health service. HOME BOUND STATUS: I certify that my clinical findings support that this patient is homebound (i.e.,  Due to illness or injury, pt requires aid of supportive devices such as crutches, cane, wheelchairs, walkers, the use of special transportation or the assistance of another person to leave their place of residence. There is a normal inability to leave the home and doing so requires considerable and taxing effort. Other absences are for medical reasons / religious services and are infrequent or of short duration when for other reasons). AMANDA, STEUART ([id number]) o If current dressing causes regression in wound condition, may D/C ordered dressing product/s and apply Normal Saline Moist Dressing daily until next [place] / Other MD appointment. [place] of regression in wound condition at [phone]. o Please direct any NON-WOUND related issues/requests for orders to patient's Primary Care  Physician Wound #2 [place] Visits - [place] Nurse may visit PRN to address patientos wound care needs. o FACE TO FACE ENCOUNTER: MEDICARE and MEDICAID PATIENTS: I certify that this patient is under my care and that I had a face-to-face encounter that meets the physician face-to-face encounter requirements with this patient on this date. The encounter with the patient was in whole or in part for the following MEDICAL CONDITION: (primary reason for [place]) MEDICAL NECESSITY: I certify, that based on my findings, NURSING services are a medically necessary home health service. HOME BOUND STATUS: I certify that my clinical findings support that this patient is homebound (i.e., Due to illness or injury, pt requires aid of supportive devices such as crutches, cane, wheelchairs, walkers, the use of special transportation or the assistance of another person to leave their place of residence. There is a normal inability to leave the home and doing so requires considerable and taxing effort. Other absences are for medical reasons / religious services and are infrequent or of short duration when for other reasons). o If current dressing causes regression in wound condition, may D/C ordered dressing product/s and apply Normal Saline Moist Dressing daily until next [place] / Other MD appointment. [place] of regression in wound condition at [phone]. o Please direct any NON-WOUND related issues/requests for orders to patient's Primary Care Physician Radiology o Computed Tomography (CT) Scan Electronic Signature(s) Signed: 08/06/2018 5:51:08 PM By: Montey Hora Signed: 08/07/2018 11:11:19 AM By: Worthy Keeler PA-C Previous Signature: 08/01/2018 4:56:21 PM Version By: Montey Hora Previous Signature: 08/01/2018 5:16:12 PM Version By: Worthy Keeler PA-C Entered By: Montey Hora on 08/06/2018  10:15:34 [place], [name] ([id number]) -------------------------------------------------------------------------------- Problem List Details Patient Name: [name] Date of Service: 08/01/2018 3:00 PM Medical Record Number: [id number] Patient Account Number: [id number] Date of Birth/Sex: [birth year] (84 y.o. 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, stage 4 05/10/2018 No Yes L89.524 Pressure ulcer of left ankle, stage 4 04/25/2018 No Yes I70.233 Atherosclerosis of native arteries of right leg with ulceration of 08/10/2017 No Yes ankle I70.243 Atherosclerosis of native arteries of left leg with ulceration of 07/10/2018 No Yes ankle I89.0 Lymphedema, not elsewhere classified 08/10/2017 No Yes B35.4 Tinea corporis 09/28/2017 No Yes Inactive Problems Resolved Problems Electronic Signature(s) Signed: 08/01/2018 5:16:12 PM By: Worthy Keeler PA-C Entered By: Worthy Keeler on 08/01/2018 15:22:51 [place], [name] ([id number]) -------------------------------------------------------------------------------- Progress Note Details Patient Name: [name] Date of Service:  08/01/2018 3:00 PM Medical Record Number: [id number] Patient Account Number: [id number] Date of Birth/Sex: 1923/09/19 (84 y.o. 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 Dr. Amalia Hailey the podiatrist saw her on 07/20/2017 and sent her to the wound center for possible hyperbaric oxygen therapy. past medical history of peripheral vascular disease, varicose veins, status post appendectomy, basal cell carcinoma excision from the left leg, cholecystectomy, pacemaker placement, right lower extremity angiography done by Dr. dew in March 2017 with placement of a stent. there is also note of a successful ablation of the right small saphenous vein done which was reviewed by ultrasound on 10/24/2016. the patient had a right small saphenous vein ablation done on 10/20/2016. The patient has never been a smoker. She has been seen by Dr. Corene Cornea dew the vascular surgeon who most recently saw her on 06/15/2017 for evaluation of ongoing problems with right leg swelling. She had a lower extremity arterial duplex examination done(02/13/17) which showed patent distal right superficial femoral artery stent and above-the-knee popliteal stent without evidence of restenosis. The ABI was more than 1.3 on the right and more than 1.3 on the left. This was consistent with noncompressible arteries due to medial calcification. The right great toe pressure and PPG waveforms are within normal limits and the left great toe pressure and PPG waveforms are decreased. he recommended she continue to wear her compression stockings and continue with elevation. She is scheduled to have a noninvasive arterial study in the near future 08/16/2017 -- had a lower extremity arterial duplex examination done which showed patent distal right superficial femoral artery stent and above-the-knee popliteal stent without evidence of restenosis. The ABI was more than 1.3 on the right and more than 1.3 on the left. This was consistent with noncompressible arteries due to medial  calcification. The right great toe pressure and PPG waveforms are within normal limits and the left great toe pressure and PPG waveforms are decreased. the x-ray of the right ankle has not yet been done 08/24/2017 -- had a right ankle x-ray -- IMPRESSION:1. No fracture, bone lesion or evidence of osteomyelitis. 2. Lateral soft tissue swelling with a soft tissue ulcer. she has not yet seen the vascular surgeon for review 08/31/17 on evaluation today patient's wound appears to be showing signs of improvement. She still with her appointment with vascular in order to review her results of her vascular study and then determine if any intervention would be recommended at that time. No fevers, chills, nausea, or vomiting noted at this time. She has been tolerating the dressing changes without complication. 09/28/17 on evaluation today patient's wound appears to show signs of good improvement in regard to the granulation tissue which is surfacing. There is still a layer of slough covering the wound and the posterior portion is still significantly deeper than the anterior nonetheless there has been some good sign of things moving towards the better. She is going to go back to Dr. dew for reevaluation to ensure her blood flow is still appropriate. That will be before her next evaluation with [place] next week. No fevers, chills, nausea,  or vomiting noted at this time. Patient does have some discomfort rated to be a 3-4/10 depending on activity specifically cleansing the wound makes it worse. 10/05/2017 -- the patient was seen by Dr. Lucky Cowboy last week and noninvasive studies showed a normal right ABI with brisk EKAM, BONEBRAKE. ([id number]) triphasic waveforms consistent with no arterial insufficiency including normal digital pressures. The duplex showed a patent distal right SFA stent and the proximal SFA was also normal. He was pleased with her test and thought she should have enough of perfusion for normal wound  healing. He would see her back in 6 months time. 12/21/17 on evaluation today patient appears to be doing fairly well in regard to her right lateral ankle wound. Unfortunately the main issue that she is expansion at this point is that she is having some issues with what appears to be some cellulitis in the right anterior shin. She has also been noting a little bit of uncomfortable feeling especially last night and her ankle area. I'm afraid that she made the developing a little bit of an infection. With that being said I think it is in the early stages. 12/28/17 on evaluation today patient's ankle appears to be doing excellent. She's making good progress at this point the cellulitis seems to have improved after last week's evaluation. Overall she is having no significant discomfort which is excellent news. She does have an appointment with Dr. dew on March 29, 2018 for reevaluation in regard to the stent he placed. She seems to have excellent blood flow in the right lower extremity. 01/19/12 on evaluation today patient's wound appears to be doing very well. In fact she does not appear to require debridement at this point, there's no evidence of infection, and overall from the standpoint of the wound she seems to be doing very well. With that being said I believe that it may be time to switch to different dressing away from the [facility] Dressing she tells me she does have a lot going on her friend actually passed away yesterday and she's also having a lot of issues with her husband this obviously is weighing heavy on her as far as your thoughts and concerns today. 01/25/18 on evaluation today patient appears to be doing fairly well in regard to her right lateral malleolus. She has been tolerating the dressing changes without complication. Overall I feel like this is definitely showing signs of improvement as far as how the overall appearance of the wound is there's also evidence of epithelium start  to migrate over the granulation tissue. In general I think that she is progressing nicely as far as the wound is concerned. The only concern she really has is whether or not we can switch to every other week visits in order to avoid having as many appointments as her daughters have a difficult time getting her to her appointments as well as the patient's husband to his he is not doing very well at this point. 02/22/18 on evaluation today patient's right lateral malleolus ulcer appears to be doing great. She has been tolerating the dressing changes without complication. Overall you making excellent progress at this time. Patient is having no significant discomfort. 03/15/18 on evaluation today patient appears to be doing much more poorly in regard to her right lateral ankle ulcer at this point. Unfortunately since have last seen her her husband has passed just a few days ago is obviously weighed heavily on her her daughter also had surgery well she is  with her today as usual. There does not appear to be any evidence of infection she does seem to have significant contusion/deep tissue injury to the right lateral malleolus which was not noted previous when I saw her last. It's hard to tell of exactly when this injury occurred although during the time she was spending the night in the hospital this may have been most likely. 03/22/18 on evaluation today patient appears to actually be doing very well in regard to her ulcer. She did unfortunately have a setback which was noted last week however the good news is we seem to be getting back on track and in fact the wound in the core did still have some necrotic tissue which will be addressed at this point today but in general I'm seeing signs that things are on the up and up. She is glad to hear this obviously she's been somewhat concerned that due to the how her wound digressed more recently. 03/29/18 on evaluation today patient appears to be doing fairly well  in regard to her right lower extremity lateral malleolus ulcer. She unfortunately does have a new area of pressure injury over the inferior portion where the wound has opened up a little bit larger secondary to the pressure she seems to be getting. She does tell me sometimes when she sleeps at night that it actually hurts and does seem to be pushing on the area little bit more unfortunately. There does not appear to be any evidence of infection which is good news. She has been tolerating the dressing changes without complication. She also did have some bruising in the left second and third toes due to the fact that she may have bump this or injured it although she has neuropathy so she does not feel she did move recently that may have been where this came from. Nonetheless there does not appear to be any evidence of infection at this time. 04/12/18 on evaluation today patient's wound on the right lateral ankle actually appears to be doing a little bit better with a lot of necrotic docking tissue centrally loosening up in clearing away. However she does have the beginnings of a deep tissue injury on the left lateral malleolus likely due to the fact we've been trying offload the right as much as we have. I think she may benefit from an assistive soft device to help with offloading and it looks like they're looking at one of the doughnut conditions that wraps around the lower leg to offload which I think will definitely do a good job. With that being said I think we definitely need to address this issue on the left before it becomes a wound. Patient is not having significant pain. MARRIA, MATHISON ([id number]) 04/19/18 on evaluation today patient appears to be doing excellent in regard to the progress she's made with her right lateral ankle ulcer. The left ankle region which did show evidence of a deep tissue injury seems to be resolving there's little fluid noted underneath and a blister there's  nothing open at this point in time overall I feel like this is progressing nicely which is good news. She does not seem to be having significant discomfort at this point which is also good news. 04/25/18-She is here in follow up evaluation for bilateral lateral malleolar ulcers. The right lateral malleolus ulcer with pale subcutaneous tissue exposure, central area of ulcer with tendon/periosteum exposed. The left lateral malleolus ulcer now with central area of nonviable tissue, otherwise deep tissue  injury. She is wearing compression wraps to the left lower extremity, she will place the right lower extremity compression wraps on when she gets home. She will be out of town over the weekend and return next week and follow-up appointment. She completed her doxycycline this morning 05/03/18 on evaluation today patient appears to be doing very well in regard to her right lateral ankle ulcer in general. At least she's showing some signs of improvement in this regard. Unfortunately she has some additional injury to the left lateral malleolus region which appears to be new likely even over the past several days. Again this determination is based on the overall appearance. With that being said the patient is obviously frustrated about this currently. 05/10/18-She is here in follow-up evaluation for bilateral lateral malleolar ulcers. She states she has purchased offloading shoes/boots and they will arrive tomorrow. She was asked to bring them in the office at next week's appointment so her provider is aware of product being utilized. She continues to sleep on right or left side, she has been encouraged to sleep on her back. The right lateral malleolus ulcer is precariously close to peri-osteum; will order xray. The left lateral malleolus ulcer is improved. Will switch back to santyl; she will follow up next week. 05/17/18 on evaluation today patient actually appears to be doing very well in regard to her  malleolus her ulcers compared to last time I saw them. She does not seem to have as much in the way of contusion at this point which is great news. With that being said she does continue to have discomfort and I do believe that she is still continuing to benefit from the offloading/pressure reducing boots that were recommended. I think this is the key to trying to get this to heal up completely. 05/24/18 on evaluation today patient actually appears to be doing worse at this point in time unfortunately compared to her last week's evaluation. She is having really no increased pain which is good news unfortunately she does have more maceration in your theme and noted surrounding the right lateral ankle the left lateral ankle is not really is erythematous I do not see signs of the overt cellulitis on that side. Unfortunately the wounds do not seem to have shown any signs of improvement since the last evaluation. She also has significant swelling especially on the right compared to previous some of this may be due to infection however also think that she may be served better while she has these wounds by compression wrapping versus continuing to use the Juxta-Lite for the time being. Especially with the amount of drainage that she is experiencing at this point. No fevers, chills, nausea, or vomiting noted at this time. 05/31/18 on evaluation today patient appears to actually be doing better in regard to her right lateral lower extremity ulcer specifically on the malleolus region. She has been tolerating the antibiotic without complication. With that being said she still continues to have issues but a little bit of redness although nothing like she what she was experiencing previous. She still continues to pressure to her ankle area she did get the problem on offloading boots unfortunately she will not wear them she states there too uncomfortable and she can't get in and out of the bed. Nonetheless at this  point her wounds seem to be continually getting worse which is not what we want I'm getting somewhat concerned about her progress and how things are going to proceed if we do not  intervene in some way shape or form. I therefore had a very lengthy conversation today about offloading yet again and even made a specific suggestion for switching her to a memory foam mattress and even gave the information for a specific one that they could look at getting if it was something that they were interested in considering. She does not want to be considered for a hospital bed air mattress although honestly insurance would not cover it that she does not have any wounds on her trunk. 06/14/18 on evaluation today both wounds over the bilateral lateral malleolus her ulcers appear to be doing better there's no evidence of pressure injury at this point. She did get the foam mattress for her bed and this does seem to have been extremely beneficial for her in my pinion. Her daughter states that she is having difficulty getting out of bed because of how soft it is. The patient also relates this to be. Nonetheless I do feel like she's actually doing better. Unfortunately right after and around the time she was getting the mattress she also sustained a fall when she got up to go pick up the phone and ended up injuring her right elbow she has 18 sutures in place. We are not caring for this currently although home health is going to be taking the sutures out shortly. Nonetheless this may be something that we need to evaluate going forward. It depends on how well it has or has not healed in the end. She also recently saw an orthopedic specialist for an injection in the right shoulder just before her fall unfortunately the fall seems to have worsened her pain. 06/21/18 on evaluation today patient appears to be doing about the same in regard to her lateral malleolus ulcers. Both appear to be just a little bit deeper but again we  are clinging away the necrotic and dead tissue which I think is why this is progressing towards a deeper realm as opposed to improving from my measurement standpoint in that regard. Nonetheless she has been tolerating the dressing changes she absolutely hates the memory foam mattress topper that was obtained for her nonetheless Taler, Kushner [name] ([id number]) I do believe this is still doing excellent as far as taking care of excess pressure in regard to the lateral malleolus regions. She in fact has no pressure injury that I see whereas in weeks past it was week by week I was constantly seeing new pressure injuries. Overall I think it has been very beneficial for her. 07/03/18; patient arrives in my clinic today. She has deep punched out areas over her bilateral lateral malleoli. The area on the right has some more depth. We spent a lot of time today talking about pressure relief for these areas. This started when her daughter asked for a prescription for a memory foam mattress. I have never written a prescription for a mattress and I don't think insurances would pay for that on an ordinary bed. In any case he came up that she has foam boots that she refuses to wear. I would suggest going to these before any other offloading issues when she is in bed. They say she is meticulous about offloading this the rest of the day 07/10/18- She is seen in follow-up evaluation for bilateral, lateral malleolus ulcers. There is no improvement in the ulcers. She has purchased and is sleeping on a memory foam mattress/overlay, she has been using the offloading boots nightly over the past week. She has a follow  up appointment with vascular medicine at the end of October, in my opinion this follow up should be expedited given her deterioration and suboptimal TBI results. We will order plain film xray of the left ankle as deeper structures are palpable; would consider having MRI, regardless of xray report(s). The ulcers  will be treated with iodoflex/iodosorb, she is unable to safely change the dressings daily with santyl. 07/19/18 on evaluation today patient appears to be doing in general visually well in regard to her bilateral lateral malleolus ulcers. She has been tolerating the dressing changes without complication which is good news. With that being said we did have an x-ray performed on 07/12/18 which revealed a slight loosen see in the lateral portion of the distal left fibula which may represent artifact but underline lytic destruction or osteomyelitis could not be excluded. MRI was recommended. With that being said we can see about getting the patient scheduled for an MRI to further evaluate this area. In fact we have that scheduled currently for August 20 19,019. 07/26/18 on evaluation today patient's wound on the right lateral ankle actually appears to be doing fairly well at this point in my pinion. She has made some good progress currently. With that being said unfortunately in regard to the left lateral ankle ulcer this seems to be a little bit more problematic at this time. In fact as I further evaluated the situation she actually had bone exposed which is the first time that's been the case in the bone appear to be necrotic. Currently I did review patient's note from Dr. Bunnie Domino office with [facility]. He stated that ABI was 1.26 on the right and 0.95 on the left with good waveforms. Her perfusion is stable not reduced from previous studies and her digital waveforms were pretty good particularly on the right. His conclusion upon review of the note was that there was not much she could do to improve her perfusion and he felt she was adequate for wound healing. His suggestion was that she continued to see [place] and consider a synthetic skin graft if there was no underlying infection. He plans to see her back in six months or as needed. 08/01/18 on evaluation today patient appears to be  doing better in regard to her right lateral ankle ulcer. Her left lateral ankle ulcer is about the same she still has bone involvement in evidence of necrosis. There does not appear to be evidence of infection at this time On the right lateral lower extremity. I have started her on the Augmentin she picked this up and started this yesterday. This is to get her through until she sees infectious disease which is scheduled for 08/12/18. Patient History Information obtained from Patient. Family History Cancer - Father,Siblings, Heart Disease - Siblings, No family history of Diabetes, Hereditary Spherocytosis, Hypertension, Kidney Disease, Lung Disease, Seizures, Stroke, Thyroid Problems, Tuberculosis. Social History Never smoker, Marital Status - Married, Alcohol Use - Never, Drug Use - No History, Caffeine Use - Rarely. Review of Systems (ROS) Constitutional Symptoms (General Health) Denies complaints or symptoms of Fever, Chills. Respiratory The patient has no complaints or symptoms. DARIKA, ILDEFONSO ([id number]) Cardiovascular The patient has no complaints or symptoms. Psychiatric The patient has no complaints or symptoms. Objective Constitutional Well-nourished and well-hydrated in no acute distress. Vitals Time Taken: 3:08 PM, Height: 65 in, Weight: 154.3 lbs, BMI: 25.7, Temperature: 98.3 F, Pulse: 88 bpm, Respiratory Rate: 18 breaths/min, Blood Pressure: 119/68 mmHg. Respiratory normal breathing without difficulty.  clear to auscultation bilaterally. Cardiovascular regular rate and rhythm with normal S1, S2. Psychiatric this patient is able to make decisions and demonstrates good insight into disease process. Alert and Oriented x 3. pleasant and cooperative. General Notes: Patient's wound bed currently shows evidence of good granulation with some Slough noted on the right lateral ankle ulcer. The left lateral ankle ulcer actually did have still some necrotic bone noted which was  sharply debrided today. Unfortunately post debridement the patient had a little bit more bleeding then what was noted last week. Nonetheless I do feel like I got more the necrotic bone off that I was able to achieve during last week's office visit. Hemostasis was achieved with the use of silver nitrate today. Integumentary (Hair, Skin) Wound #1 status is Open. Original cause of wound was Gradually Appeared. The wound is located on the Right,Lateral Malleolus. The wound measures 1.7cm length x 1.6cm width x 0.4cm depth; 2.136cm^2 area and 0.855cm^3 volume. There is Fat Layer (Subcutaneous Tissue) Exposed exposed. There is no tunneling or undermining noted. There is a large amount of purulent drainage noted. Foul odor after cleansing was noted. The wound margin is distinct with the outline attached to the wound base. There is small (1-33%) pink granulation within the wound bed. There is a large (67-100%) amount of necrotic tissue within the wound bed including Eschar and Adherent Slough. The periwound skin appearance exhibited: Maceration, Rubor, Erythema. The periwound skin appearance did not exhibit: Callus, Crepitus, Excoriation, Induration, Rash, Scarring, Dry/Scaly, Atrophie Blanche, Cyanosis, Ecchymosis, Hemosiderin Staining, Mottled, Pallor. The surrounding wound skin color is noted with erythema which is circumferential. Periwound temperature was noted as No Abnormality. The periwound has tenderness on palpation. Wound #2 status is Open. Original cause of wound was Gradually Appeared. The wound is located on the Left,Lateral Malleolus. The wound measures 1.9cm length x 1.6cm width x 0.5cm depth; 2.388cm^2 area and 1.194cm^3 volume. There is Fat Layer (Subcutaneous Tissue) Exposed exposed. There is no tunneling or undermining noted. There is a large amount of purulent drainage noted. Foul odor after cleansing was noted. The wound margin is flat and intact. There is small (1-33%) pink  granulation within the wound bed. There is a large (67-100%) amount of necrotic tissue within the wound bed including Eschar and Adherent Slough. The periwound skin appearance exhibited: Maceration, Erythema. The periwound skin appearance did not exhibit: Callus, Crepitus, Excoriation, Induration, Rash, Scarring, Dry/Scaly, Atrophie Blanche, Cyanosis, Ecchymosis, Hemosiderin Staining, Mottled, Pallor, Rubor. The surrounding wound skin color is noted with erythema which is Chavous, [name] ([id number]) circumferential. Periwound temperature was noted as No Abnormality. The periwound has tenderness on palpation. 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 There was a Excisional Skin/Subcutaneous Tissue Debridement with a total area of 2.72 sq cm performed by STONE III, HOYT E., PA-C. With the following instrument(s): Curette to remove Viable and Non-Viable tissue/material. Material removed includes Subcutaneous Tissue and Slough and after achieving pain control using Lidocaine 4% Topical Solution. No specimens were taken. A time out was conducted at 16:17, prior to the start of the procedure. A Minimum amount of bleeding was controlled with  Pressure. The procedure was tolerated well with a pain level of 0 throughout and a pain level of 0 following the procedure. Patient s Level of Consciousness post procedure was recorded as Awake and Alert. Post Debridement Measurements: 1.7cm length x 1.6cm width x 0.5cm depth; 1.068cm^3 volume. Post debridement Stage noted as  Category/Stage IV. Character of Wound/Ulcer Post Debridement is improved. Severity of Tissue Post Debridement is: Fat layer exposed. Post procedure Diagnosis Wound #1: Same as Pre-Procedure Wound #2 Pre-procedure diagnosis of Wound #2 is a Pressure Ulcer located on the Left,Lateral Malleolus .Severity of Tissue Pre Debridement is: Fat layer exposed. There was a Excisional Skin/Subcutaneous Tissue/Muscle/Bone Debridement with a total area of 3.04 sq cm performed by STONE III, HOYT E., PA-C. With the following instrument(s): Curette to remove Viable and Non-Viable tissue/material. Material removed includes Bone,Subcutaneous Tissue, and Slough after achieving pain control using Lidocaine 4% Topical Solution. No specimens were taken. A time out was conducted at 16:19, prior to the start of the procedure. A Minimum amount of bleeding was controlled with Pressure. The procedure was tolerated well with a pain level of 0 throughout and a pain level of 0 following the procedure. Patient s Level of Consciousness post procedure was recorded as Awake and Alert. Post Debridement Measurements: 1.9cm length x 1.6cm width x 0.6cm depth; 1.433cm^3 volume. Post debridement Stage noted as Category/Stage IV. Character of Wound/Ulcer Post Debridement is improved. Severity of Tissue Post Debridement is: Fat layer exposed. Post procedure Diagnosis Wound #2: Same as Pre-Procedure Plan CHARISSA, KNOWLES ([id number]) Wound Cleansing: Wound #1 Right,Lateral Malleolus: Clean wound with Normal Saline. Cleanse wound with mild soap and water May Shower, gently pat wound dry prior to applying new dressing. Wound #2 Left,Lateral Malleolus: Clean wound with Normal Saline. Cleanse wound with mild soap and water May Shower, gently pat wound dry prior to applying new dressing. Anesthetic (add to Medication List): Wound #1 Right,Lateral Malleolus: Topical Lidocaine 4% cream applied to wound bed prior to debridement (In  Clinic Only). Wound #2 Left,Lateral Malleolus: Topical Lidocaine 4% cream applied to wound bed prior to debridement (In Clinic Only). Primary Wound Dressing: Wound #1 Right,Lateral Malleolus: Iodoflex Wound #2 Left,Lateral Malleolus: Iodoflex Secondary Dressing: Wound #1 Right,Lateral Malleolus: Boardered Foam Dressing Wound #2 Left,Lateral Malleolus: Boardered Foam Dressing Dressing Change Frequency: Wound #1 Right,Lateral Malleolus: Change Dressing Monday, Wednesday, Friday Wound #2 Left,Lateral Malleolus: Change Dressing Monday, Wednesday, Friday Follow-up Appointments: Wound #1 Right,Lateral Malleolus: Return Appointment in 1 week. Wound #2 Left,Lateral Malleolus: Return Appointment in 1 week. Edema Control: Wound #1 Right,Lateral Malleolus: Patient to wear own Velcro compression garment. Elevate legs to the level of the heart and pump ankles as often as possible Wound #2 Left,Lateral Malleolus: Patient to wear own Velcro compression garment. Elevate legs to the level of the heart and pump ankles as often as possible Off-Loading: Wound #1 Right,Lateral Malleolus: Mattress Turn and reposition every 2 hours Other: - do not put pressure on your right ankle Wound #2 Left,Lateral Malleolus: Mattress Turn and reposition every 2 hours Other: - do not put pressure on your right ankle Additional Orders / Instructions: Wound #1 Right,Lateral Malleolus: Increase protein intake. Wound #2 Left,Lateral Malleolus: Increase protein intake. Home Health: Wound #1 Right,Lateral Malleolus: [place] Visits - [place] Nurse may visit PRN to address patient s wound care needs. ALDEEN, RIGA ([id number]) FACE TO FACE ENCOUNTER: MEDICARE and MEDICAID PATIENTS: I certify that this patient is under my care and that I had a face-to-face encounter  that meets the physician face-to-face encounter requirements with this patient on this date. The encounter with the  patient was in whole or in part for the following MEDICAL CONDITION: (primary reason for [place]) MEDICAL NECESSITY: I certify, that based on my findings, NURSING services are a medically necessary home health service. HOME BOUND STATUS: I certify that my clinical findings support that this patient is homebound (i.e., Due to illness or injury, pt requires aid of supportive devices such as crutches, cane, wheelchairs, walkers, the use of special transportation or the assistance of another person to leave their place of residence. There is a normal inability to leave the home and doing so requires considerable and taxing effort. Other absences are for medical reasons / religious services and are infrequent or of short duration when for other reasons). If current dressing causes regression in wound condition, may D/C ordered dressing product/s and apply Normal Saline Moist Dressing daily until next [place] / Other MD appointment. [place] of regression in wound condition at [phone]. Please direct any NON-WOUND related issues/requests for orders to patient's Primary Care Physician Wound #2 Left,Lateral Malleolus: [place] Visits - [place] Nurse may visit PRN to address patient s wound care needs. FACE TO FACE ENCOUNTER: MEDICARE and MEDICAID PATIENTS: I certify that this patient is under my care and that I had a face-to-face encounter that meets the physician face-to-face encounter requirements with this patient on this date. The encounter with the patient was in whole or in part for the following MEDICAL CONDITION: (primary reason for [place]) MEDICAL NECESSITY: I certify, that based on my findings, NURSING services are a medically necessary home health service. HOME BOUND STATUS: I certify that my clinical findings support that this patient is homebound (i.e., Due to illness or injury, pt requires aid of supportive devices  such as crutches, cane, wheelchairs, walkers, the use of special transportation or the assistance of another person to leave their place of residence. There is a normal inability to leave the home and doing so requires considerable and taxing effort. Other absences are for medical reasons / religious services and are infrequent or of short duration when for other reasons). If current dressing causes regression in wound condition, may D/C ordered dressing product/s and apply Normal Saline Moist Dressing daily until next [place] / Other MD appointment. [place] of regression in wound condition at [phone]. Please direct any NON-WOUND related issues/requests for orders to patient's Primary Care Physician I'm gonna suggest currently that we continue with the above wound care orders for the next week. I do think that it still appropriate for her to see infectious disease we did have an extensive conversation as well about the possibility of hyperbaric oxygen therapy. We will subsequently see how things proceed over the next several weeks obviously I explained before we can get approval for this it has to develop into what would be termed more of a chronic refractory osteomyelitis which essentially requires 30 days of more conservative and appropriate wound care therapy including IV antibiotic therapy. Obviously this is in the works. Please see above for specific wound care orders. We will see patient for re-evaluation in 1 week(s) here in the clinic. If anything worsens or changes patient will contact our office for additional recommendations. Electronic Signature(s) Signed: 08/01/2018 5:16:12 PM By: Worthy Keeler PA-C Entered By: Worthy Keeler on 08/01/2018 17:02:55 Noon, [name] ([id number]) -------------------------------------------------------------------------------- ROS/PFSH Details Patient Name:  Hitt, [name] Date of Service: 08/01/2018 3:00  PM Medical Record Number: [id number] Patient Account Number: [id number] Date of Birth/Sex: November 03, 1923 (84 y.o. F) Treating RN: Ahmed Prima Primary Care Provider: Grayland Ormond Other Clinician: Referring Provider: Grayland Ormond Treating Provider/Extender: Melburn Hake, HOYT Weeks in Treatment: 50 Information Obtained From Patient Wound History Do you currently have one or more open woundso Yes How many open wounds do you currently haveo 1 Approximately how long have you had your woundso 2 yrs How have you been treating your wound(s) until nowo mupirocin, soaking in epsom salt Has your wound(s) ever healed and then re-openedo No Have you had any lab work done in the past montho No Have you tested positive for an antibiotic resistant organism (MRSA, VRE)o No Have you tested positive for osteomyelitis (bone infection)o No Have you had any tests for circulation on your legso Yes Who ordered the testo Dr. Lucky Cowboy Where was the test doneo [facility] Constitutional Symptoms (General Health) Complaints and Symptoms: Negative for: Fever; Chills Eyes Medical History: Positive for: Cataracts - surgery Respiratory Complaints and Symptoms: No Complaints or Symptoms Cardiovascular Complaints and Symptoms: No Complaints or Symptoms Medical History: Positive for: Congestive Heart Failure; Hypertension Musculoskeletal Medical History: Positive for: Osteoarthritis Neurologic Medical History: Positive for: Neuropathy Oncologic MICAH, GALENO ([id number]) Medical History: Negative for: Received Chemotherapy; Received Radiation Psychiatric Complaints and Symptoms: No Complaints or Symptoms HBO Extended History Items Eyes: Cataracts Immunizations Pneumococcal Vaccine: Received Pneumococcal Vaccination: Yes Implantable Devices Family and Social History Cancer: Yes - Father,Siblings; Diabetes: No; Heart Disease: Yes - Siblings; Hereditary Spherocytosis: No; Hypertension: No; Kidney  Disease: No; Lung Disease: No; Seizures: No; Stroke: No; Thyroid Problems: No; Tuberculosis: No; Never smoker; Marital Status - Married; Alcohol Use: Never; Drug Use: No History; Caffeine Use: Rarely; Financial Concerns: No; Food, Clothing or Shelter Needs: No; Support System Lacking: No; Transportation Concerns: No; Advanced Directives: No; Patient does not want information on Advanced Directives; Do not resuscitate: No; Living Will: Yes (Not Provided); Medical Power of Attorney: No Physician Affirmation I have reviewed and agree with the above information. Electronic Signature(s) Signed: 08/01/2018 5:16:12 PM By: Worthy Keeler PA-C Signed: 08/02/2018 4:06:12 PM By: Alric Quan Entered By: Worthy Keeler on 08/01/2018 17:01:41 Pollman, [name] ([id number]) -------------------------------------------------------------------------------- SuperBill Details Patient Name: [name] Date of Service: 08/01/2018 Medical Record Number: [id number] Patient Account Number: [id number] Date of Birth/Sex: [birth year] (84 y.o. 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 I70.243 Atherosclerosis of native arteries of left leg with ulceration of ankle I89.0 Lymphedema, not elsewhere classified B35.4 Tinea corporis Facility Procedures CPT4 Code: 93903009 Description: 23300 - DEB SUBQ TISSUE 20 SQ CM/< ICD-10 Diagnosis Description 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, stage 4 Modifier: Quantity: 1 CPT4 Code: 76226333 Description: 11044 - DEB BONE 20 SQ CM/< ICD-10 Diagnosis Description L89.524 Pressure ulcer of left ankle,  stage 4 Modifier: Quantity: 1 Physician Procedures CPT4: Description Modifier Quantity Code [id number] 11042 - WC PHYS SUBQ TISS 20 SQ CM 1 ICD-10 Diagnosis Description 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, stage 4 CPT4: 3893734 Debridement; bone (includes epidermis, dermis, subQ tissue, muscle and/or fascia, if 1 performed) 1st 20 sqcm or less ICD-10 Diagnosis Description L89.524  Pressure ulcer of left ankle, stage 4 Electronic Signature(s) Signed: 08/01/2018 5:16:12 PM By: Worthy Keeler PA-C Entered By: Worthy Keeler on 08/01/2018 17:03:12

## 2018-08-09 LAB — CULTURE, BLOOD (ROUTINE X 2)
CULTURE: NO GROWTH
Culture: NO GROWTH
SPECIAL REQUESTS: ADEQUATE
SPECIAL REQUESTS: ADEQUATE

## 2018-08-12 ENCOUNTER — Ambulatory Visit: Payer: Medicare Other | Admitting: Internal Medicine

## 2018-08-13 ENCOUNTER — Ambulatory Visit
Admission: RE | Admit: 2018-08-13 | Discharge: 2018-08-13 | Disposition: A | Discharge status: Home / Self Care | Payer: Medicare Other | Source: Ambulatory Visit | Attending: Physician Assistant | Admitting: Physician Assistant

## 2018-08-13 DIAGNOSIS — S91009A Unspecified open wound, unspecified ankle, initial encounter: Secondary | ICD-10-CM | POA: Diagnosis present

## 2018-08-13 DIAGNOSIS — X58XXXA Exposure to other specified factors, initial encounter: Secondary | ICD-10-CM | POA: Diagnosis not present

## 2018-08-13 DIAGNOSIS — R937 Abnormal findings on diagnostic imaging of other parts of musculoskeletal system: Secondary | ICD-10-CM | POA: Insufficient documentation

## 2018-08-13 DIAGNOSIS — L89524 Pressure ulcer of left ankle, stage 4: Secondary | ICD-10-CM

## 2018-08-13 DIAGNOSIS — L03116 Cellulitis of left lower limb: Secondary | ICD-10-CM | POA: Diagnosis not present

## 2018-08-13 HISTORY — DX: Basal cell carcinoma of skin, unspecified: C44.91

## 2018-08-13 MED ORDER — IOPAMIDOL (ISOVUE-300) INJECTION 61%
75.0000 mL | Freq: Once | INTRAVENOUS | Status: AC | PRN
  Administered 2018-08-13: 75 mL via INTRAVENOUS

## 2018-08-14 ENCOUNTER — Ambulatory Visit (INDEPENDENT_AMBULATORY_CARE_PROVIDER_SITE_OTHER): Payer: Medicare Other | Admitting: Internal Medicine

## 2018-08-14 ENCOUNTER — Telehealth: Payer: Self-pay | Admitting: Pharmacist

## 2018-08-14 ENCOUNTER — Encounter: Payer: Medicare Other | Admitting: Internal Medicine

## 2018-08-14 ENCOUNTER — Encounter: Payer: Self-pay | Admitting: Internal Medicine

## 2018-08-14 ENCOUNTER — Other Ambulatory Visit: Payer: Self-pay | Admitting: Pharmacist

## 2018-08-14 DIAGNOSIS — I70243 Atherosclerosis of native arteries of left leg with ulceration of ankle: Secondary | ICD-10-CM | POA: Diagnosis not present

## 2018-08-14 DIAGNOSIS — M86172 Other acute osteomyelitis, left ankle and foot: Secondary | ICD-10-CM | POA: Diagnosis not present

## 2018-08-14 DIAGNOSIS — I739 Peripheral vascular disease, unspecified: Secondary | ICD-10-CM

## 2018-08-14 DIAGNOSIS — Z23 Encounter for immunization: Secondary | ICD-10-CM | POA: Diagnosis not present

## 2018-08-14 DIAGNOSIS — L03116 Cellulitis of left lower limb: Secondary | ICD-10-CM | POA: Diagnosis not present

## 2018-08-14 DIAGNOSIS — M86672 Other chronic osteomyelitis, left ankle and foot: Secondary | ICD-10-CM | POA: Insufficient documentation

## 2018-08-14 MED ORDER — LINEZOLID 600 MG PO TABS: 600.0000 mg | ORAL_TABLET | Freq: Two times a day (BID) | ORAL | 0 refills | Status: DC

## 2018-08-14 NOTE — Progress Notes (Signed)
Patient ID: [name], female   DOB: December 02, 1923, 84 y.o.   MRN: [id number]

## 2018-08-14 NOTE — Assessment & Plan Note (Signed)
Resolved over the ulcer area.

## 2018-08-14 NOTE — Assessment & Plan Note (Signed)
Osteomyelitis by CT.  I think oral linezolid will be a good option with good bioavailability for Staph coverage.  I will do for two weeks and convert to oral keflex after that for 1-2 months if improving.  If not, may consider a trial of IV cefazolin.  If no improvement with either, likely non-healing and not treatable medically. Follow up in 2 weeks

## 2018-08-14 NOTE — Progress Notes (Signed)
   Subjective:    Patient ID: [name], female    DOB: October 08, 1923, 84 y.o.   MRN: [id number]  HPI Here for hsfu. She was intially referred due to concern for osteomyelitis and ended up in the hospital with non-healing left lateral malleolus ulcer.  A recent bone culture done at wound care by Dr. Grayling Congress with [facility].  In the hospital she was noted to have cellulitis and swelling and put on IV cefazolin and improved some and sent out on Keflex.  She though has continued the Augmentin since she still had it.  She underwent a CT evaluation of the left malleolus yesterday due to concern of ostoemyelitis and scan is concerning for osteomyleitis with lytic areas on the bone and exposure of the bone.  She is known to have poor vasculature overall.  She and her daughter with her are hopeful to avoid a picc line, though will consider if needed.  No associated fever or chills.     Review of Systems  Gastrointestinal: Negative for diarrhea and nausea.  Neurological: Positive for weakness. Negative for dizziness.       Objective:   Physical Exam  Constitutional: She appears well-developed and well-nourished. No distress.  HENT:  Mouth/Throat: No oropharyngeal exudate.  Cardiovascular: Normal rate, regular rhythm and normal heart sounds.  No murmur heard. Pulmonary/Chest: Effort normal and breath sounds normal. No respiratory distress.  Musculoskeletal:  Some edema  Skin: No rash noted.   SH: never smoker       Assessment & Plan:

## 2018-08-14 NOTE — Telephone Encounter (Signed)
[name] ran Linezolid through our system and the cost was $80.33. [name], patient's daughter, states that cost is doable and will pick it up at [facility] tomorrow.  I told her to call me if it was more or if she had any trouble.

## 2018-08-14 NOTE — Assessment & Plan Note (Signed)
Lower blood flow so more difficult to treat overall.

## 2018-08-16 NOTE — Progress Notes (Signed)
CARRA, BRINDLEY ([id number]) Visit Report for 08/14/2018 Arrival Information Details Patient Name: [name], [name] Date of Service: 08/14/2018 4:00 PM Medical Record Number: [id number] Patient Account Number: [id number] Date of Birth/Sex: August 04, 1923 (84 y.o. F) Treating RN: Montey Hora Primary Care Tinslee Klare: Grayland Ormond Other Clinician: Referring Clara Smolen: Grayland Ormond Treating Todd Jelinski/Extender: Tito Dine in Treatment: 34 Visit Information History Since Last Visit All ordered tests and consults were completed: Yes Patient Arrived: Ambulatory Added or deleted any medications: No Arrival Time: 16:04 Any new allergies or adverse reactions: No Accompanied By: self Had a fall or experienced change in No Transfer Assistance: None activities of daily living that may affect Patient Identification Verified: Yes risk of falls: Secondary Verification Process Yes Signs or symptoms of abuse/neglect since last visito No Completed: Hospitalized since last visit: No Patient Requires Transmission-Based No Implantable device outside of the clinic excluding No Precautions: cellular [name] based products placed in the center Patient Has Alerts: Yes since last visit: Patient Alerts: ABI AVVS 03/29/18 L Has Dressing in Place as Prescribed: Yes 1.05 Pain Present Now: No R 1.03, TBI L .56, R .85 ABI AVVS 07/23/18 L .95 R 1.26 Electronic Signature(s) Signed: 08/14/2018 5:20:22 PM By: Montey Hora Entered By: Montey Hora on 08/14/2018 16:51:38 Helmuth, [name] ([id number]) -------------------------------------------------------------------------------- Encounter Discharge Information Details Patient Name: [name] Date of Service: 08/14/2018 4:00 PM Medical Record Number: [id number] Patient Account Number: [id number] Date of Birth/Sex: 06/05/1923 (84 y.o. F) Treating RN: Montey Hora Primary Care Andrez Lieurance: Grayland Ormond Other  Clinician: Referring Owens Hara: Grayland Ormond Treating Ailen Strauch/Extender: Tito Dine in Treatment: 30 Encounter Discharge Information Items Discharge Condition: Stable Ambulatory Status: Walker Discharge Destination: Home Transportation: Private Auto Accompanied By: dtr Schedule Follow-up Appointment: Yes Clinical Summary of Care: Electronic Signature(s) Signed: 08/14/2018 5:08:41 PM By: Montey Hora Entered By: Montey Hora on 08/14/2018 17:08:41 Kinkead, [name] ([id number]) -------------------------------------------------------------------------------- Lower Extremity Assessment Details Patient Name: [name] Date of Service: 08/14/2018 4:00 PM Medical Record Number: [id number] Patient Account Number: [id number] Date of Birth/Sex: 15-Mar-1923 (84 y.o. F) Treating RN: Roger Shelter Primary Care Bannie Lobban: Grayland Ormond Other Clinician: Referring Lometa Riggin: Grayland Ormond Treating Deloras Reichard/Extender: Tito Dine in Treatment: 52 Edema Assessment Assessed: [Left: No] [Right: No] Edema: [Left: Yes] [Right: Yes] Calf Left: Right: Point of Measurement: 29 cm From Medial Instep 28.5 cm 32 cm Ankle Left: Right: Point of Measurement: 12 cm From Medial Instep 21.5 cm 22.5 cm Vascular Assessment Claudication: Claudication Assessment [Left:None] [Right:None] Pulses: Dorsalis Pedis Palpable: [Left:Yes] [Right:Yes] Posterior Tibial Extremity colors, hair growth, and conditions: Extremity Color: [Left:Hyperpigmented] [Right:Hyperpigmented] Hair Growth on Extremity: [Left:No] [Right:No] Temperature of Extremity: [Left:Warm] [Right:Warm] Capillary Refill: [Left:< 3 seconds] [Right:< 3 seconds] Toe Nail Assessment Left: Right: Thick: No No Discolored: No No Deformed: No No Improper Length and Hygiene: No No Electronic Signature(s) Signed: 08/14/2018 5:03:38 PM By: Roger Shelter Entered By: Roger Shelter on 08/14/2018  16:23:12 [name], [name] ([id number]) -------------------------------------------------------------------------------- Multi Wound Chart Details Patient Name: [name] Date of Service: 08/14/2018 4:00 PM Medical Record Number: [id number] Patient Account Number: [id number] Date of Birth/Sex: 10-30-1923 (84 y.o. 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 N/A Comorbid History: Cataracts, Congestive Heart Cataracts, Congestive Heart N/A Failure, Hypertension, Failure, Hypertension, Osteoarthritis, Neuropathy Osteoarthritis, Neuropathy Date Acquired: 08/11/2015 04/12/2018 N/A Weeks of Treatment: 52 16 N/A Wound Status: Open Open N/A Measurements L x W x D 1.4x1.2x0.4 2x1.7x1.5 N/A (cm) Area (cm) : 1.319 2.67 N/A Volume (cm) : 0.528 4.006 N/A % Reduction in Area: 13.90% -240.10% N/A % Reduction in Volume: -15.00% -4970.90% N/A Classification: Category/Stage IV Category/Stage IV N/A Exudate Amount: Large Large N/A Exudate Type: Serosanguineous Purulent N/A Exudate Color: red, brown yellow, brown, green N/A Foul Odor After Cleansing: Yes Yes N/A Odor Anticipated Due to No No N/A Product Use: Wound Margin: Distinct, outline attached Flat and Intact N/A Granulation Amount: Small (1-33%) Small (1-33%) N/A Granulation Quality: Pink Pink N/A Necrotic Amount: Large (67-100%) Large  (67-100%) N/A Necrotic [name]: Eschar, Adherent Slough Eschar, Adherent Slough N/A Exposed Structures: N/A [name], [name] ([id number]) Fat Layer (Subcutaneous Fat Layer (Subcutaneous [name]) Exposed: Yes [name]) Exposed: Yes Fascia: No Fascia: No Tendon: No Tendon: No Muscle: No Muscle: No Joint: No Joint: No Bone: No Bone: No Epithelialization: None None N/A Debridement: N/A Debridement - Excisional N/A Pre-procedure N/A 16:44 N/A Verification/Time Out Taken: Pain Control: N/A Lidocaine 4% Topical Solution N/A [name] Debrided: N/A Subcutaneous N/A Level: N/A Skin/Subcutaneous [name] N/A Debridement Area (sq cm): N/A 3.4 N/A Instrument: N/A Blade, Forceps N/A Bleeding: N/A Moderate N/A Hemostasis Achieved: N/A Pressure N/A Procedural Pain: N/A 0 N/A Post Procedural Pain: N/A 0 N/A Debridement Treatment N/A Procedure was tolerated well N/A Response: Post Debridement N/A 2x1.7x2 N/A Measurements L x W x D (cm) Post Debridement Volume: N/A 5.341 N/A (cm) Post Debridement Stage: N/A Category/Stage IV N/A Periwound Skin Texture: Excoriation: No Excoriation: No N/A Induration: No Induration: No Callus: No Callus: No Crepitus: No Crepitus: No Rash: No Rash: No Scarring: No Scarring: No Periwound Skin Moisture: Maceration: Yes Maceration: Yes N/A Dry/Scaly: No Dry/Scaly: No Periwound Skin Color: Erythema: Yes Erythema: Yes N/A Rubor: Yes Atrophie Blanche: No Atrophie Blanche: No Cyanosis: No Cyanosis: No Ecchymosis: No Ecchymosis: No Hemosiderin Staining: No Hemosiderin Staining: No Mottled: No Mottled: No Pallor: No Pallor: No Rubor: No Erythema Location: Circumferential Circumferential N/A Temperature: No Abnormality No Abnormality N/A Tenderness on Palpation: Yes Yes N/A Wound Preparation: Ulcer Cleansing: Ulcer Cleansing: N/A Rinsed/Irrigated with Saline Rinsed/Irrigated with Saline Topical Anesthetic Applied: Topical Anesthetic  Applied: Other: lidocaine 4% Other: lidocaine 4% Procedures Performed: N/A Debridement N/A Treatment Notes Wound #1 (Right, Lateral Malleolus) 1. Cleansed with: [name] ([id number]) Clean wound with Normal Saline 2. Anesthetic Topical Lidocaine 4% cream to wound bed prior to debridement 3. Peri-wound Care: Skin Prep Notes Snap Vac one piece of foam Wound #2 (Left, Lateral Malleolus) 1. Cleansed with: Clean wound with Normal Saline 2. Anesthetic Topical Lidocaine 4% cream to wound bed prior to debridement 3. Peri-wound Care: Skin Prep 4. Dressing Applied: Calcium Alginate with Silver 5. Secondary Dressing Applied Bordered Foam Dressing Electronic Signature(s) Signed: 08/14/2018 5:30:50 PM By: Linton Ham MD Previous Signature: 08/14/2018 5:20:22 PM Version By: Montey Hora Entered By: Linton Ham on 08/14/2018 17:23:16 [name], [name] ([id number]) -------------------------------------------------------------------------------- [place] Details Patient Name: [name] Date of Service: 08/14/2018 4:00 PM Medical Record Number: [id number] Patient Account Number: [id number] Date of Birth/Sex: November 03, 1923 (84 y.o. F) Treating RN: Montey Hora Primary Care Mescal Flinchbaugh: Grayland Ormond Other Clinician: Referring Nailyn Dearinger: Grayland Ormond Treating Oluwanifemi Petitti/Extender: Dellia Nims  MICHAEL G Weeks in Treatment: 52 Active Inactive ` Abuse / Safety / Falls / Self Care Management Nursing Diagnoses: Potential for falls Goals: Patient will not experience any injury related to falls Date Initiated: 08/10/2017 Target Resolution Date: 11/10/2017 Goal Status: Active Interventions: Assess Activities of Daily Living upon admission and as needed Assess fall risk on admission and as needed Assess: immobility, friction, shearing, incontinence upon admission and as needed Notes: ` Nutrition Nursing Diagnoses: Imbalanced nutrition Potential for  alteratiion in Nutrition/Potential for imbalanced nutrition Goals: Patient/caregiver agrees to and verbalizes understanding of need to use nutritional supplements and/or vitamins as prescribed Date Initiated: 08/10/2017 Target Resolution Date: 12/08/2017 Goal Status: Active Interventions: Assess patient nutrition upon admission and as needed per policy Notes: ` Orientation to the Wound Care Program Nursing Diagnoses: Knowledge deficit related to the wound healing center program Goals: Patient/caregiver will verbalize understanding of the [facility] Program Date Initiated: 08/10/2017 Target Resolution Date: 09/08/2017 [name], [name] ([id number]) Goal Status: Active Interventions: Provide education on orientation to the wound center Notes: ` Pain, Acute or Chronic Nursing Diagnoses: Pain, acute or chronic: actual or potential Potential alteration in comfort, pain Goals: Patient/caregiver will verbalize adequate pain control between visits Date Initiated: 08/10/2017 Target Resolution Date: 12/08/2017 Goal Status: Active Interventions: Complete pain assessment as per visit requirements Notes: ` Wound/Skin Impairment Nursing Diagnoses: Impaired [name] integrity Knowledge deficit related to ulceration/compromised skin integrity Goals: Ulcer/skin breakdown will have a volume reduction of 80% by week 12 Date Initiated: 08/10/2017 Target Resolution Date: 12/01/2017 Goal Status: Active Interventions: Assess patient/caregiver ability to perform ulcer/skin care regimen upon admission and as needed Notes: Electronic Signature(s) Signed: 08/14/2018 5:20:22 PM By: Montey Hora Entered By: Montey Hora on 08/14/2018 16:42:26 [name], [name] ([id number]) -------------------------------------------------------------------------------- Pain Assessment Details Patient Name: [name] Date of Service: 08/14/2018 4:00 PM Medical Record Number: [id number] Patient  Account Number: [id number] Date of Birth/Sex: 23-Sep-1923 (84 y.o. F) Treating RN: Montey Hora Primary Care Melo Stauber: Grayland Ormond Other Clinician: Referring Emmanuella Mirante: Grayland Ormond Treating Ingris Pasquarella/Extender: Tito Dine in Treatment: 57 Active Problems Location of Pain Severity and Description of Pain Patient Has Paino No Site Locations Pain Management and Medication Current Pain Management: Electronic Signature(s) Signed: 08/14/2018 4:54:10 PM By: Paulla Fore, RRT, CHT Signed: 08/14/2018 5:20:22 PM By: Montey Hora Entered By: Lorine Bears on 08/14/2018 16:09:11 [name], [name] ([id number]) -------------------------------------------------------------------------------- Patient/Caregiver Education Details Patient Name: [name] Date of Service: 08/14/2018 4:00 PM Medical Record Number: [id number] Patient Account Number: [id number] Date of Birth/Gender: [birth year] (84 y.o. F) Treating RN: Montey Hora Primary Care Physician: Grayland Ormond Other Clinician: Referring Physician: Grayland Ormond Treating Physician/Extender: Tito Dine in Treatment: 76 Education Assessment Education Provided To: Patient and Caregiver Education Topics Provided Wound/Skin Impairment: Handouts: Other: wound care as ordered Methods: Demonstration, Explain/Verbal Responses: State content correctly Electronic Signature(s) Signed: 08/14/2018 5:20:22 PM By: Montey Hora Entered By: Montey Hora on 08/14/2018 17:08:59 [name], [name] ([id number]) -------------------------------------------------------------------------------- Wound Assessment Details Patient Name: [name] Date of Service: 08/14/2018 4:00 PM Medical Record Number: [id number] Patient Account Number: [id number] Date of Birth/Sex: Jan 16, 1923 (84 y.o. F) Treating RN: Roger Shelter Primary Care Kareem Cathey: Grayland Ormond Other  Clinician: Referring Jammal Sarr: Grayland Ormond Treating Kaedence Connelly/Extender: Ricard Dillon Weeks in Treatment: 52 Wound Status Wound Number: 1 Primary Pressure Ulcer Etiology: Wound Location: Right Malleolus - Lateral Secondary Arterial Insufficiency Ulcer Wounding Event: Gradually Appeared Etiology: Date Acquired: 08/11/2015 Wound Status: Open Weeks Of Treatment: 52 Comorbid  Cataracts, Congestive Heart Failure, Clustered Wound: No History: Hypertension, Osteoarthritis, Neuropathy Photos Photo Uploaded By: Roger Shelter on 08/14/2018 17:05:59 Wound Measurements Length: (cm) 1.4 Width: (cm) 1.2 Depth: (cm) 0.4 Area: (cm) 1.319 Volume: (cm) 0.528 % Reduction in Area: 13.9% % Reduction in Volume: -15% Epithelialization: None Tunneling: No Undermining: No Wound Description Classification: Category/Stage IV Wound Margin: Distinct, outline attached Exudate Amount: Large Exudate Type: Serosanguineous Exudate Color: red, brown Foul Odor After Cleansing: Yes Due to Product Use: No Slough/Fibrino Yes Wound Bed Granulation Amount: Small (1-33%) Exposed Structure Granulation Quality: Pink Fascia Exposed: No Necrotic Amount: Large (67-100%) Fat Layer (Subcutaneous [name]) Exposed: Yes Necrotic Quality: Eschar, Adherent Slough Tendon Exposed: No Muscle Exposed: No Joint Exposed: No Bone Exposed: No Periwound Skin Texture Kranz, [name] ([id number]) Texture Color No Abnormalities Noted: No No Abnormalities Noted: No Callus: No Atrophie Blanche: No Crepitus: No Cyanosis: No Excoriation: No Ecchymosis: No Induration: No Erythema: Yes Rash: No Erythema Location: Circumferential Scarring: No Hemosiderin Staining: No Mottled: No Moisture Pallor: No No Abnormalities Noted: No Rubor: Yes Dry / Scaly: No Maceration: Yes Temperature / Pain Temperature: No Abnormality Tenderness on Palpation: Yes Wound Preparation Ulcer Cleansing: Rinsed/Irrigated with  Saline Topical Anesthetic Applied: Other: lidocaine 4%, Treatment Notes Wound #1 (Right, Lateral Malleolus) 1. Cleansed with: Clean wound with Normal Saline 2. Anesthetic Topical Lidocaine 4% cream to wound bed prior to debridement 3. Peri-wound Care: Skin Prep Notes Snap Vac one piece of foam Electronic Signature(s) Signed: 08/14/2018 5:03:38 PM By: Roger Shelter Entered By: Roger Shelter on 08/14/2018 16:19:44 [name], [name] ([id number]) -------------------------------------------------------------------------------- Wound Assessment Details Patient Name: [name] Date of Service: 08/14/2018 4:00 PM Medical Record Number: [id number] Patient Account Number: [id number] Date of Birth/Sex: [birth year] (84 y.o. F) Treating RN: Roger Shelter Primary Care Areen Trautner: Grayland Ormond Other Clinician: Referring Marigene Erler: Grayland Ormond Treating Shalayne Leach/Extender: Tito Dine in Treatment: 2 Wound Status Wound Number: 2 Primary Pressure Ulcer Etiology: Wound Location: Left Malleolus - Lateral Secondary Arterial Insufficiency Ulcer Wounding Event: Gradually Appeared Etiology: Date Acquired: 04/12/2018 Wound Status: Open Weeks Of Treatment: 16 Comorbid Cataracts, Congestive Heart Failure, Clustered Wound: No History: Hypertension, Osteoarthritis, Neuropathy Photos Photo Uploaded By: Roger Shelter on 08/14/2018 17:06:15 Wound Measurements Length: (cm) 2 Width: (cm) 1.7 Depth: (cm) 1.5 Area: (cm) 2.67 Volume: (cm) 4.006 % Reduction in Area: -240.1% % Reduction in Volume: -4970.9% Epithelialization: None Tunneling: No Undermining: No Wound Description Classification: Category/Stage IV Foul Wound Margin: Flat and Intact Due Exudate Amount: Large Slou Exudate Type: Purulent Exudate Color: yellow, brown, green Odor After Cleansing: Yes to Product Use: No gh/Fibrino Yes Wound Bed Granulation Amount: Small (1-33%) Exposed  Structure Granulation Quality: Pink Fascia Exposed: No Necrotic Amount: Large (67-100%) Fat Layer (Subcutaneous [name]) Exposed: Yes Necrotic Quality: Eschar, Adherent Slough Tendon Exposed: No Muscle Exposed: No Joint Exposed: No Bone Exposed: No Periwound Skin Texture [name], [name] ([id number]) Texture Color No Abnormalities Noted: No No Abnormalities Noted: No Callus: No Atrophie Blanche: No Crepitus: No Cyanosis: No Excoriation: No Ecchymosis: No Induration: No Erythema: Yes Rash: No Erythema Location: Circumferential Scarring: No Hemosiderin Staining: No Mottled: No Moisture Pallor: No No Abnormalities Noted: No Rubor: No Dry / Scaly: No Maceration: Yes Temperature / Pain Temperature: No Abnormality Tenderness on Palpation: Yes Wound Preparation Ulcer Cleansing: Rinsed/Irrigated with Saline Topical Anesthetic Applied: Other: lidocaine 4%, Treatment Notes Wound #2 (Left, Lateral Malleolus) 1. Cleansed with: Clean wound with Normal Saline 2. Anesthetic Topical Lidocaine 4% cream to wound bed prior to debridement 3.  Peri-wound Care: Skin Prep 4. Dressing Applied: Calcium Alginate with Silver 5. Secondary Dressing Applied Bordered Foam Dressing Electronic Signature(s) Signed: 08/14/2018 5:03:38 PM By: Roger Shelter Entered By: Roger Shelter on 08/14/2018 16:20:20 [name], [name] ([id number]) -------------------------------------------------------------------------------- [place] Details Patient Name: [name] Date of Service: 08/14/2018 4:00 PM Medical Record Number: [id number] Patient Account Number: [id number] Date of Birth/Sex: 06-01-1923 (84 y.o. F) Treating RN: Montey Hora Primary Care Varina Hulon: Grayland Ormond Other Clinician: Referring Page Lancon: Grayland Ormond Treating Caige Almeda/Extender: Tito Dine in Treatment: 52 Vital Signs Time Taken: 16:07 Temperature (F): 98.5 Height (in): 65 Pulse (bpm):  79 Weight (lbs): 154.3 Respiratory Rate (breaths/min): 18 Body Mass Index (BMI): 25.7 Blood Pressure (mmHg): 139/65 Reference Range: 80 - 120 mg / dl Electronic Signature(s) Signed: 08/14/2018 4:54:10 PM By: Lorine Bears RCP, RRT, CHT Entered By: Becky Sax, Amado Nash on 08/14/2018 16:11:23

## 2018-08-19 NOTE — Progress Notes (Signed)
RMONI, KEPLINGER ([id number]) Visit Report for 08/14/2018 Debridement Details Patient Name: [name]. Date of Service: 08/14/2018 4:00 PM Medical Record Number: [id number] Patient Account Number: [id number] Date of Birth/Sex: 05/12/1923 (84 y.o. F) Treating RN: Montey Hora Primary Care Provider: Grayland Ormond Other Clinician: Referring Provider: Grayland Ormond Treating Provider/Extender: Tito Dine in Treatment: 26 Debridement Performed for Wound #2 Left,Lateral Malleolus Assessment: Performed By: Physician Ricard Dillon, MD Debridement Type: Debridement Severity of Tissue Pre Bone involvement without necrosis Debridement: Pre-procedure Verification/Time Yes - 16:44 Out Taken: Start Time: 16:44 Pain Control: Lidocaine 4% Topical Solution Total Area Debrided (L x W): 2 (cm) x 1.7 (cm) = 3.4 (cm) Tissue and other material Viable, Non-Viable, Subcutaneous debrided: Level: Skin/Subcutaneous Tissue Debridement Description: Excisional Instrument: Blade, Forceps Bleeding: Moderate Hemostasis Achieved: Pressure End Time: 16:47 Procedural Pain: 0 Post Procedural Pain: 0 Response to Treatment: Procedure was tolerated well Level of Consciousness: Awake and Alert Post Debridement Measurements of Total Wound Length: (cm) 2 Stage: Category/Stage IV Width: (cm) 1.7 Depth: (cm) 2 Volume: (cm) 5.341 Character of Wound/Ulcer Post Improved Debridement: Bone involvement without Severity of Tissue Post Debridement: necrosis Post Procedure Diagnosis Same as Pre-procedure Electronic Signature(s) Signed: 08/14/2018 5:30:50 PM By: Linton Ham MD Signed: 08/15/2018 5:06:40 PM By: Montey Hora Previous Signature: 08/14/2018 5:20:22 PM Version By: Montey Hora Entered By: Linton Ham on 08/14/2018 17:23:37 Oros, [name] ([id number]) Joni Fears, [name]  ([id number]) -------------------------------------------------------------------------------- HPI Details Patient Name: [name] Date of Service: 08/14/2018 4:00 PM Medical Record Number: [id number] Patient Account Number: [id number] Date of Birth/Sex: Jan 03, 1923 (84 y.o. 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 Dr. Amalia Hailey the podiatrist saw her on 07/20/2017 and sent her to the wound center for possible hyperbaric oxygen therapy. past medical history of peripheral vascular disease, varicose veins, status post appendectomy, basal cell carcinoma excision from the left leg, cholecystectomy, pacemaker placement, right lower extremity angiography done by Dr. dew in March 2017 with placement of a stent. there is also note of a successful ablation of the right small saphenous vein done which was reviewed by ultrasound on 10/24/2016. the patient had a right small saphenous vein ablation done on 10/20/2016. The patient has never been a smoker. She has been seen by Dr. Corene Cornea dew the vascular surgeon who most recently saw her on 06/15/2017 for evaluation of ongoing problems with right leg swelling. She had a lower extremity arterial duplex examination done(02/13/17) which showed patent distal right superficial femoral artery stent and above-the-knee popliteal stent without evidence of restenosis. The ABI was more than 1.3 on the right and more than 1.3 on the left. This was consistent with noncompressible arteries due to medial calcification. The right great toe pressure and PPG waveforms are within normal  limits and the left great toe pressure and PPG waveforms are decreased. he recommended she continue to wear her compression stockings and continue with elevation. She is scheduled to have a noninvasive arterial study in the near future 08/16/2017 -- had a lower extremity arterial duplex examination done which showed patent distal right superficial femoral artery stent and above-the-knee popliteal stent without evidence of restenosis. The ABI was more than 1.3 on the right  and more than 1.3 on the left. This was consistent with noncompressible arteries due to medial calcification. The right great toe pressure and PPG waveforms are within normal limits and the left great toe pressure and PPG waveforms are decreased. the x-ray of the right ankle has not yet been done 08/24/2017 -- had a right ankle x-ray -- IMPRESSION:1. No fracture, bone lesion or evidence of osteomyelitis. 2. Lateral soft tissue swelling with a soft tissue ulcer. she has not yet seen the vascular surgeon for review 08/31/17 on evaluation today patient's wound appears to be showing signs of improvement. She still with her appointment with vascular in order to review her results of her vascular study and then determine if any intervention would be recommended at that time. No fevers, chills, nausea, or vomiting noted at this time. She has been tolerating the dressing changes without complication. 09/28/17 on evaluation today patient's wound appears to show signs of good improvement in regard to the granulation tissue which is surfacing. There is still a layer of slough covering the wound and the posterior portion is still significantly deeper than the anterior nonetheless there has been some good sign of things moving towards the better. She is going to go back to Dr. dew for reevaluation to ensure her blood flow is still appropriate. That will be before her next evaluation with [place] next week. No fevers, chills, nausea, or vomiting  noted at this time. Patient does have some discomfort rated to be a 3-4/10 depending on activity specifically cleansing the wound makes it worse. 10/05/2017 -- the patient was seen by Dr. Lucky Cowboy last week and noninvasive studies showed a normal right ABI with brisk triphasic waveforms consistent with no arterial insufficiency including normal digital pressures. The duplex showed a patent distal right SFA stent and the proximal SFA was also normal. He was pleased with her test and thought she should have enough of perfusion for normal wound healing. He would see her back in 6 months time. 12/21/17 on evaluation today patient appears to be doing fairly well in regard to her right lateral ankle wound. Unfortunately the main issue that she is expansion at this point is that she is having some issues with what appears to be some cellulitis in the ERLENE, DEVITA. ([id number]) right anterior shin. She has also been noting a little bit of uncomfortable feeling especially last night and her ankle area. I'm afraid that she made the developing a little bit of an infection. With that being said I think it is in the early stages. 12/28/17 on evaluation today patient's ankle appears to be doing excellent. She's making good progress at this point the cellulitis seems to have improved after last week's evaluation. Overall she is having no significant discomfort which is excellent news. She does have an appointment with Dr. dew on March 29, 2018 for reevaluation in regard to the stent he placed. She seems to have excellent blood flow in the right lower extremity. 01/19/12 on evaluation today patient's wound appears to be doing very well. In fact she does not appear to require debridement at this point, there's no evidence of infection, and overall from the standpoint of the wound she seems to be doing very well. With that being said I believe that it may be time to switch to different dressing away from the [employer] Dressing she tells me she does have a lot going on her friend actually passed away yesterday and she's also having a lot of issues  with her husband this obviously is weighing heavy on her as far as your thoughts and concerns today. 01/25/18 on evaluation today patient appears to be doing fairly well in regard to her right lateral malleolus. She has been tolerating the dressing changes without complication. Overall I feel like this is definitely showing signs of improvement as far as how the overall appearance of the wound is there's also evidence of epithelium start to migrate over the granulation tissue. In general I think that she is progressing nicely as far as the wound is concerned. The only concern she really has is whether or not we can switch to every other week visits in order to avoid having as many appointments as her daughters have a difficult time getting her to her appointments as well as the patient's husband to his he is not doing very well at this point. 02/22/18 on evaluation today patient's right lateral malleolus ulcer appears to be doing great. She has been tolerating the dressing changes without complication. Overall you making excellent progress at this time. Patient is having no significant discomfort. 03/15/18 on evaluation today patient appears to be doing much more poorly in regard to her right lateral ankle ulcer at this point. Unfortunately since have last seen her her husband has passed just a few days ago is obviously weighed heavily on her her daughter also had surgery well she is with her today as usual. There does not appear to be any evidence of infection she does seem to have significant contusion/deep tissue injury to the right lateral malleolus which was not noted previous when I saw her last. It's hard to tell of exactly when this injury occurred although during the time she was spending the night in the hospital this may have been most likely. 03/22/18 on  evaluation today patient appears to actually be doing very well in regard to her ulcer. She did unfortunately have a setback which was noted last week however the good news is we seem to be getting back on track and in fact the wound in the core did still have some necrotic tissue which will be addressed at this point today but in general I'm seeing signs that things are on the up and up. She is glad to hear this obviously she's been somewhat concerned that due to the how her wound digressed more recently. 03/29/18 on evaluation today patient appears to be doing fairly well in regard to her right lower extremity lateral malleolus ulcer. She unfortunately does have a new area of pressure injury over the inferior portion where the wound has opened up a little bit larger secondary to the pressure she seems to be getting. She does tell me sometimes when she sleeps at night that it actually hurts and does seem to be pushing on the area little bit more unfortunately. There does not appear to be any evidence of infection which is good news. She has been tolerating the dressing changes without complication. She also did have some bruising in the left second and third toes due to the fact that she may have bump this or injured it although she has neuropathy so she does not feel she did move recently that may have been where this came from. Nonetheless there does not appear to be any evidence of infection at this time. 04/12/18 on evaluation today patient's wound on the right lateral ankle actually appears to be doing a little bit better with a lot of necrotic docking tissue centrally loosening up  in clearing away. However she does have the beginnings of a deep tissue injury on the left lateral malleolus likely due to the fact we've been trying offload the right as much as we have. I think she may benefit from an assistive soft device to help with offloading and it looks like they're looking at one of the  doughnut conditions that wraps around the lower leg to offload which I think will definitely do a good job. With that being said I think we definitely need to address this issue on the left before it becomes a wound. Patient is not having significant pain. 04/19/18 on evaluation today patient appears to be doing excellent in regard to the progress she's made with her right lateral ankle ulcer. The left ankle region which did show evidence of a deep tissue injury seems to be resolving there's little fluid noted underneath and a blister there's nothing open at this point in time overall I feel like this is progressing nicely which is good news. She does not seem to be having significant discomfort at this point which is also good news. 04/25/18-She is here in follow up evaluation for bilateral lateral malleolar ulcers. The right lateral malleolus ulcer with pale subcutaneous tissue exposure, central area of ulcer with tendon/periosteum exposed. The left lateral malleolus ulcer now with Rossitto, [name] ([id number]) central area of nonviable tissue, otherwise deep tissue injury. She is wearing compression wraps to the left lower extremity, she will place the right lower extremity compression wraps on when she gets home. She will be out of town over the weekend and return next week and follow-up appointment. She completed her doxycycline this morning 05/03/18 on evaluation today patient appears to be doing very well in regard to her right lateral ankle ulcer in general. At least she's showing some signs of improvement in this regard. Unfortunately she has some additional injury to the left lateral malleolus region which appears to be new likely even over the past several days. Again this determination is based on the overall appearance. With that being said the patient is obviously frustrated about this currently. 05/10/18-She is here in follow-up evaluation for bilateral lateral malleolar ulcers. She  states she has purchased offloading shoes/boots and they will arrive tomorrow. She was asked to bring them in the office at next week's appointment so her provider is aware of product being utilized. She continues to sleep on right or left side, she has been encouraged to sleep on her back. The right lateral malleolus ulcer is precariously close to peri-osteum; will order xray. The left lateral malleolus ulcer is improved. Will switch back to santyl; she will follow up next week. 05/17/18 on evaluation today patient actually appears to be doing very well in regard to her malleolus her ulcers compared to last time I saw them. She does not seem to have as much in the way of contusion at this point which is great news. With that being said she does continue to have discomfort and I do believe that she is still continuing to benefit from the offloading/pressure reducing boots that were recommended. I think this is the key to trying to get this to heal up completely. 05/24/18 on evaluation today patient actually appears to be doing worse at this point in time unfortunately compared to her last week's evaluation. She is having really no increased pain which is good news unfortunately she does have more maceration in your theme and noted surrounding the right lateral ankle the left  lateral ankle is not really is erythematous I do not see signs of the overt cellulitis on that side. Unfortunately the wounds do not seem to have shown any signs of improvement since the last evaluation. She also has significant swelling especially on the right compared to previous some of this may be due to infection however also think that she may be served better while she has these wounds by compression wrapping versus continuing to use the Juxta-Lite for the time being. Especially with the amount of drainage that she is experiencing at this point. No fevers, chills, nausea, or vomiting noted at this time. 05/31/18 on  evaluation today patient appears to actually be doing better in regard to her right lateral lower extremity ulcer specifically on the malleolus region. She has been tolerating the antibiotic without complication. With that being said she still continues to have issues but a little bit of redness although nothing like she what she was experiencing previous. She still continues to pressure to her ankle area she did get the problem on offloading boots unfortunately she will not wear them she states there too uncomfortable and she can't get in and out of the bed. Nonetheless at this point her wounds seem to be continually getting worse which is not what we want I'm getting somewhat concerned about her progress and how things are going to proceed if we do not intervene in some way shape or form. I therefore had a very lengthy conversation today about offloading yet again and even made a specific suggestion for switching her to a memory foam mattress and even gave the information for a specific one that they could look at getting if it was something that they were interested in considering. She does not want to be considered for a hospital bed air mattress although honestly insurance would not cover it that she does not have any wounds on her trunk. 06/14/18 on evaluation today both wounds over the bilateral lateral malleolus her ulcers appear to be doing better there's no evidence of pressure injury at this point. She did get the foam mattress for her bed and this does seem to have been extremely beneficial for her in my pinion. Her daughter states that she is having difficulty getting out of bed because of how soft it is. The patient also relates this to be. Nonetheless I do feel like she's actually doing better. Unfortunately right after and around the time she was getting the mattress she also sustained a fall when she got up to go pick up the phone and ended up injuring her right elbow she has 18  sutures in place. We are not caring for this currently although home health is going to be taking the sutures out shortly. Nonetheless this may be something that we need to evaluate going forward. It depends on how well it has or has not healed in the end. She also recently saw an orthopedic specialist for an injection in the right shoulder just before her fall unfortunately the fall seems to have worsened her pain. 06/21/18 on evaluation today patient appears to be doing about the same in regard to her lateral malleolus ulcers. Both appear to be just a little bit deeper but again we are clinging away the necrotic and dead tissue which I think is why this is progressing towards a deeper realm as opposed to improving from my measurement standpoint in that regard. Nonetheless she has been tolerating the dressing changes she absolutely hates the memory foam  mattress topper that was obtained for her nonetheless I do believe this is still doing excellent as far as taking care of excess pressure in regard to the lateral malleolus regions. She in fact has no pressure injury that I see whereas in weeks past it was week by week I was constantly seeing new pressure injuries. Overall I think it has been very beneficial for her. 07/03/18; patient arrives in my clinic today. She has deep punched out areas over her bilateral lateral malleoli. The area on the right has some more depth. TAYDEN, DURAN ([id number]) We spent a lot of time today talking about pressure relief for these areas. This started when her daughter asked for a prescription for a memory foam mattress. I have never written a prescription for a mattress and I don't think insurances would pay for that on an ordinary bed. In any case he came up that she has foam boots that she refuses to wear. I would suggest going to these before any other offloading issues when she is in bed. They say she is meticulous about offloading this the rest of the  day 07/10/18- She is seen in follow-up evaluation for bilateral, lateral malleolus ulcers. There is no improvement in the ulcers. She has purchased and is sleeping on a memory foam mattress/overlay, she has been using the offloading boots nightly over the past week. She has a follow up appointment with vascular medicine at the end of October, in my opinion this follow up should be expedited given her deterioration and suboptimal TBI results. We will order plain film xray of the left ankle as deeper structures are palpable; would consider having MRI, regardless of xray report(s). The ulcers will be treated with iodoflex/iodosorb, she is unable to safely change the dressings daily with santyl. 07/19/18 on evaluation today patient appears to be doing in general visually well in regard to her bilateral lateral malleolus ulcers. She has been tolerating the dressing changes without complication which is good news. With that being said we did have an x-ray performed on 07/12/18 which revealed a slight loosen see in the lateral portion of the distal left fibula which may represent artifact but underline lytic destruction or osteomyelitis could not be excluded. MRI was recommended. With that being said we can see about getting the patient scheduled for an MRI to further evaluate this area. In fact we have that scheduled currently for August 20 19,019. 07/26/18 on evaluation today patient's wound on the right lateral ankle actually appears to be doing fairly well at this point in my pinion. She has made some good progress currently. With that being said unfortunately in regard to the left lateral ankle ulcer this seems to be a little bit more problematic at this time. In fact as I further evaluated the situation she actually had bone exposed which is the first time that's been the case in the bone appear to be necrotic. Currently I did review patient's note from Dr. Bunnie Domino office with [facility]. He stated that ABI was 1.26 on the right and 0.95 on the left with good waveforms. Her perfusion is stable not reduced from previous studies and her digital waveforms were pretty good particularly on the right. His conclusion upon review of the note was that there was not much she could do to improve her perfusion and he felt she was adequate for wound healing. His suggestion was that she continued to see [place] and consider a synthetic skin graft if  there was no underlying infection. He plans to see her back in six months or as needed. 08/01/18 on evaluation today patient appears to be doing better in regard to her right lateral ankle ulcer. Her left lateral ankle ulcer is about the same she still has bone involvement in evidence of necrosis. There does not appear to be evidence of infection at this time On the right lateral lower extremity. I have started her on the Augmentin she picked this up and started this yesterday. This is to get her through until she sees infectious disease which is scheduled for 08/12/18. 08/06/18 on evaluation today patient appears to be doing rather well considering my discussion with patient's daughter at the end of last week. The area which was marked where she had erythema seems to be improved and this is good news. With that being said overall the patient seems to be making good improvement when it comes to the overall appearance of the right lateral ankle ulcer although this has been slow she at least is coming around in this regard. Unfortunately in regard to the left lateral ankle ulcer this is osteomyelitis based on the pathology report as well is bone culture. Nonetheless we are still waiting CT scan. Unfortunately the MRI we originally ordered cannot be performed as the patient is a pacemaker which I had overlooked. Nonetheless we are working on the CT scan approval and scheduling as of now. She did go to the hospital over the weekend and was placed on IV  Cefzo for a couple of days. Fortunately this seems to have improved the erythema quite significantly which is good news. There does not appear to be any evidence of worsening infection at this time. She did have some bleeding after the last debridement therefore I did not perform any sharp debridement in regard to left lateral ankle at this point. Patient has been approved for a snap vac for the right lateral ankle. 08/14/18; the patient with wounds over her bilateral lateral malleoli. The area on the right actually looks quite good. Been using a snap back on this area. Healthy granulation and appears to be filling in. Unfortunately the area on the left is really problematic. 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 Also noted to have cellulitis. She saw Dr. Novella Olive of infectious disease today and was put on linezolid. We are able to verify this with her pharmacy. She is completed the Augmentin that she was already on. We've been using Iodoflex to this area Electronic Signature(s) Signed: 08/14/2018 5:30:50 PM By: Linton Ham MD Kesinger, [name] ([id number]) Entered By: Linton Ham on 08/14/2018 17:25:41 Pistole, [name] ([id number]) -------------------------------------------------------------------------------- Physical Exam Details Patient Name: [name] Date of Service: 08/14/2018 4:00 PM Medical Record Number: [id number] Patient Account Number: [id number] Date of Birth/Sex: 1923-10-20 (84 y.o. F) Treating RN: Montey Hora Primary Care Provider: Grayland Ormond Other Clinician: Referring Provider: Grayland Ormond Treating Provider/Extender: Tito Dine in Treatment: 55 Constitutional Sitting or standing Blood Pressure is within target range for patient.. Pulse regular and within target range for patient.Marland Kitchen Respirations regular, non-labored and within target range..  Temperature is normal and within the target range for the patient.Marland Kitchen appears in no distress. Respiratory Respiratory effort is easy and symmetric bilaterally. Rate is normal at rest and on room air.. Cardiovascular palpable dorsalis pedis pulses. Notes wound exam oThe area on the right lateral malleolus has a nice well  granulated wound bed minimal depth. Using a snap back to this area which should continue oUnfortunately the area over the left lateral malleolus is not nearly as good. She has some not very viable looking material over the bone itself. However inferiorly at roughly 4 to 8:00 there is a deep probing area that goes deep into the soft tissue over bone. Necrotic material from the surface of this was removed and a part of this was devitalized tendon. I saw no purulent drainage but this wound has considerable depth Electronic Signature(s) Signed: 08/14/2018 5:30:50 PM By: Linton Ham MD Entered By: Linton Ham on 08/14/2018 17:27:19 Mickiewicz, [name] ([id number]) -------------------------------------------------------------------------------- Physician Orders Details Patient Name: [name] Date of Service: 08/14/2018 4:00 PM Medical Record Number: [id number] Patient Account Number: [id number] Date of Birth/Sex: 1923/01/22 (84 y.o. F) Treating RN: Montey Hora Primary Care Provider: Grayland Ormond Other Clinician: Referring Provider: Grayland Ormond Treating Provider/Extender: Tito Dine in Treatment: 6 Verbal / Phone Orders: No Diagnosis Coding Wound Cleansing Wound #1 Right,Lateral Malleolus o Clean wound with Normal Saline. o Cleanse wound with mild soap and water o May Shower, gently pat wound dry prior to applying new dressing. Wound #2 Left,Lateral Malleolus o Clean wound with Normal Saline. o Cleanse wound with mild soap and water o May Shower, gently pat wound dry prior to applying new dressing. Anesthetic (add to  Medication List) Wound #1 Right,Lateral Malleolus o Topical Lidocaine 4% cream applied to wound bed prior to debridement (In Clinic Only). Wound #2 Left,Lateral Malleolus o Topical Lidocaine 4% cream applied to wound bed prior to debridement (In Clinic Only). Primary Wound Dressing Wound #2 Left,Lateral Malleolus o Silver Alginate - packed into tunnel Secondary Dressing Wound #2 Left,Lateral Malleolus o Boardered Foam Dressing Dressing Change Frequency Wound #1 Right,Lateral Malleolus o Change dressing every week - HHRN may remove SNAP vac and use iodoflex and bordered foam dressing if SNAP vac fills up or becomes soiled Wound #2 Left,Lateral Malleolus o Change Dressing Monday, Wednesday, Friday Follow-up Appointments Wound #1 Right,Lateral Malleolus o Return Appointment in 1 week. Wound #2 Left,Lateral Malleolus o Return Appointment in 1 week. Edema Control ELMYRA, [name] ([id number]) Wound #1 Right,Lateral Malleolus o Patient to wear own Velcro compression garment. o Elevate legs to the level of the heart and pump ankles as often as possible Wound #2 Left,Lateral Malleolus o Patient to wear own Velcro compression garment. o Elevate legs to the level of the heart and pump ankles as often as possible Off-Loading Wound #1 Right,Lateral Malleolus o Mattress o Turn and reposition every 2 hours o Other: - do not put pressure on your right ankle Wound #2 Left,Lateral Malleolus o Mattress o Turn and reposition every 2 hours o Other: - do not put pressure on your right ankle Additional Orders / Instructions Wound #1 Right,Lateral Malleolus o Increase protein intake. Wound #2 Left,Lateral Malleolus o Increase protein intake. Home Health Wound #1 [place] Visits - [place] Nurse may visit PRN to address patientos wound care needs. o FACE TO FACE ENCOUNTER: MEDICARE and MEDICAID  PATIENTS: I certify that this patient is under my care and that I had a face-to-face encounter that meets the physician face-to-face encounter requirements with this patient on this date. The encounter with the patient was in whole or in part for the following MEDICAL CONDITION: (primary reason for [place]) MEDICAL NECESSITY: I certify, that based on my findings, NURSING services are a medically necessary home  health service. HOME BOUND STATUS: I certify that my clinical findings support that this patient is homebound (i.e., Due to illness or injury, pt requires aid of supportive devices such as crutches, cane, wheelchairs, walkers, the use of special transportation or the assistance of another person to leave their place of residence. There is a normal inability to leave the home and doing so requires considerable and taxing effort. Other absences are for medical reasons / religious services and are infrequent or of short duration when for other reasons). o If current dressing causes regression in wound condition, may D/C ordered dressing product/s and apply Normal Saline Moist Dressing daily until next [place] / Other MD appointment. [place] of regression in wound condition at [phone]. o Please direct any NON-WOUND related issues/requests for orders to patient's Primary Care Physician Wound #2 [place] Visits - [place] Nurse may visit PRN to address patientos wound care needs. o FACE TO FACE ENCOUNTER: MEDICARE and MEDICAID PATIENTS: I certify that this patient is under my care and that I had a face-to-face encounter that meets the physician face-to-face encounter requirements with this patient on this date. The encounter with the patient was in whole or in part for the following MEDICAL CONDITION: (primary reason for [place]) MEDICAL NECESSITY: I certify, that based on my  findings, NURSING services are a medically necessary home health service. HOME BOUND STATUS: I certify that my clinical findings support that this patient is homebound (i.e., Due to illness or injury, pt requires aid of supportive devices such as crutches, cane, wheelchairs, walkers, the use of special transportation or the assistance of another person to leave their place of residence. There is a normal inability to leave the home SANVIKA, CUTTINO. ([id number]) and doing so requires considerable and taxing effort. Other absences are for medical reasons / religious services and are infrequent or of short duration when for other reasons). o If current dressing causes regression in wound condition, may D/C ordered dressing product/s and apply Normal Saline Moist Dressing daily until next [place] / Other MD appointment. [place] of regression in wound condition at [phone]. o Please direct any NON-WOUND related issues/requests for orders to patient's Primary Care Physician Electronic Signature(s) Signed: 08/14/2018 5:20:22 PM By: Montey Hora Signed: 08/14/2018 5:30:50 PM By: Linton Ham MD Entered By: Montey Hora on 08/14/2018 16:49:45 Levenhagen, [name] ([id number]) -------------------------------------------------------------------------------- Problem List Details Patient Name: [name] Date of Service: 08/14/2018 4:00 PM Medical Record Number: [id number] Patient Account Number: [id number] Date of Birth/Sex: 1923-06-11 (84 y.o. 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, stage 4 05/10/2018 No Yes L89.524 Pressure ulcer of left ankle, stage 4 04/25/2018 No  Yes I70.233 Atherosclerosis of native arteries of right leg with ulceration of 08/10/2017 No Yes ankle I70.243 Atherosclerosis of native arteries of left leg with ulceration of 07/10/2018 No Yes ankle I89.0 Lymphedema, not elsewhere classified 08/10/2017 No Yes B35.4 Tinea corporis 09/28/2017 No Yes 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 Dr. Amalia Hailey the podiatrist saw her on 07/20/2017 and sent her to the wound center for possible hyperbaric oxygen therapy. past medical history of peripheral vascular disease, varicose veins, status post appendectomy, basal cell carcinoma excision from the left leg, cholecystectomy, pacemaker placement, right lower extremity angiography done by Dr. dew in March 2017 with placement of a stent. there is also note of a successful ablation of the right small saphenous vein done which was reviewed by ultrasound on 10/24/2016. the patient had a right small saphenous vein ablation done on 10/20/2016. The patient has never been a  smoker. She has been seen by Dr. Corene Cornea dew the vascular surgeon who most recently saw her on 06/15/2017 for evaluation of ongoing problems with right leg swelling. She had a lower extremity arterial duplex examination done(02/13/17) which showed patent distal right superficial femoral artery stent and above-the-knee popliteal stent without evidence of restenosis. The ABI was more than 1.3 on the right and more than 1.3 on the left. This was consistent with noncompressible arteries due to medial calcification. The right great toe pressure and PPG waveforms are within normal limits and the left great toe pressure and PPG waveforms are decreased. he recommended she continue to wear her compression stockings and continue with elevation. She is scheduled to have a noninvasive arterial study in the near future 08/16/2017 -- had a lower extremity arterial duplex examination done which showed patent distal right superficial femoral artery stent and above-the-knee popliteal stent without evidence of restenosis. The ABI was more than 1.3 on the right and more than 1.3 on the left. This was consistent with noncompressible arteries due to medial calcification. The right great toe pressure and PPG waveforms are within normal limits and the left great toe pressure and PPG waveforms are decreased. the x-ray of the right ankle has not yet been done 08/24/2017 -- had a right ankle x-ray -- IMPRESSION:1. No fracture, bone lesion or evidence of osteomyelitis. 2. Lateral soft tissue swelling with a soft tissue ulcer. she has not yet seen the vascular surgeon for review 08/31/17 on evaluation today patient's wound appears to be showing signs of improvement. She still with her appointment with vascular in order to review her results of her vascular study and then determine if any intervention would be recommended at that time. No fevers, chills, nausea, or vomiting noted at this time. She has been tolerating the dressing  changes without complication. 09/28/17 on evaluation today patient's wound appears to show signs of good improvement in regard to the granulation tissue which is surfacing. There is still a layer of slough covering the wound and the posterior portion is still significantly deeper than the anterior nonetheless there has been some good sign of things moving towards the better. She is going to go back to Dr. dew for reevaluation to ensure her blood flow is still appropriate. That will be before her next evaluation with [place] next week. No fevers, chills, nausea, or vomiting noted at this time. Patient does have some discomfort rated to be a 3-4/10 depending on  activity specifically cleansing the wound makes it worse. 10/05/2017 -- the patient was seen by Dr. Lucky Cowboy last week and noninvasive studies showed a normal right ABI with brisk triphasic waveforms consistent with no arterial insufficiency including normal digital pressures. The duplex showed a patent distal right SFA stent and the proximal SFA was also normal. He was pleased with her test and thought she should have enough of perfusion for normal wound healing. He would see her back in 6 months time. 12/21/17 on evaluation today patient appears to be doing fairly well in regard to her right lateral ankle wound. Unfortunately the JADASIA, HAWS ([id number]) main issue that she is expansion at this point is that she is having some issues with what appears to be some cellulitis in the right anterior shin. She has also been noting a little bit of uncomfortable feeling especially last night and her ankle area. I'm afraid that she made the developing a little bit of an infection. With that being said I think it is in the early stages. 12/28/17 on evaluation today patient's ankle appears to be doing excellent. She's making good progress at this point the cellulitis seems to have improved after last week's evaluation. Overall she is having no significant  discomfort which is excellent news. She does have an appointment with Dr. dew on March 29, 2018 for reevaluation in regard to the stent he placed. She seems to have excellent blood flow in the right lower extremity. 01/19/12 on evaluation today patient's wound appears to be doing very well. In fact she does not appear to require debridement at this point, there's no evidence of infection, and overall from the standpoint of the wound she seems to be doing very well. With that being said I believe that it may be time to switch to different dressing away from the [facility] Dressing she tells me she does have a lot going on her friend actually passed away yesterday and she's also having a lot of issues with her husband this obviously is weighing heavy on her as far as your thoughts and concerns today. 01/25/18 on evaluation today patient appears to be doing fairly well in regard to her right lateral malleolus. She has been tolerating the dressing changes without complication. Overall I feel like this is definitely showing signs of improvement as far as how the overall appearance of the wound is there's also evidence of epithelium start to migrate over the granulation tissue. In general I think that she is progressing nicely as far as the wound is concerned. The only concern she really has is whether or not we can switch to every other week visits in order to avoid having as many appointments as her daughters have a difficult time getting her to her appointments as well as the patient's husband to his he is not doing very well at this point. 02/22/18 on evaluation today patient's right lateral malleolus ulcer appears to be doing great. She has been tolerating the dressing changes without complication. Overall you making excellent progress at this time. Patient is having no significant discomfort. 03/15/18 on evaluation today patient appears to be doing much more poorly in regard to her right lateral  ankle ulcer at this point. Unfortunately since have last seen her her husband has passed just a few days ago is obviously weighed heavily on her her daughter also had surgery well she is with her today as usual. There does not appear to be any evidence of infection she does seem  to have significant contusion/deep tissue injury to the right lateral malleolus which was not noted previous when I saw her last. It's hard to tell of exactly when this injury occurred although during the time she was spending the night in the hospital this may have been most likely. 03/22/18 on evaluation today patient appears to actually be doing very well in regard to her ulcer. She did unfortunately have a setback which was noted last week however the good news is we seem to be getting back on track and in fact the wound in the core did still have some necrotic tissue which will be addressed at this point today but in general I'm seeing signs that things are on the up and up. She is glad to hear this obviously she's been somewhat concerned that due to the how her wound digressed more recently. 03/29/18 on evaluation today patient appears to be doing fairly well in regard to her right lower extremity lateral malleolus ulcer. She unfortunately does have a new area of pressure injury over the inferior portion where the wound has opened up a little bit larger secondary to the pressure she seems to be getting. She does tell me sometimes when she sleeps at night that it actually hurts and does seem to be pushing on the area little bit more unfortunately. There does not appear to be any evidence of infection which is good news. She has been tolerating the dressing changes without complication. She also did have some bruising in the left second and third toes due to the fact that she may have bump this or injured it although she has neuropathy so she does not feel she did move recently that may have been where this came from.  Nonetheless there does not appear to be any evidence of infection at this time. 04/12/18 on evaluation today patient's wound on the right lateral ankle actually appears to be doing a little bit better with a lot of necrotic docking tissue centrally loosening up in clearing away. However she does have the beginnings of a deep tissue injury on the left lateral malleolus likely due to the fact we've been trying offload the right as much as we have. I think she may benefit from an assistive soft device to help with offloading and it looks like they're looking at one of the doughnut conditions that wraps around the lower leg to offload which I think will definitely do a good job. With that being said I think we definitely need to address this issue on the left before it becomes a wound. Patient is not having significant pain. 04/19/18 on evaluation today patient appears to be doing excellent in regard to the progress she's made with her right lateral ankle ulcer. The left ankle region which did show evidence of a deep tissue injury seems to be resolving there's little fluid noted underneath and a blister there's nothing open at this point in time overall I feel like this is progressing nicely which is good news. She does not seem to be having significant discomfort at this point which is also good news. 04/25/18-She is here in follow up evaluation for bilateral lateral malleolar ulcers. The right lateral malleolus ulcer with pale Kling, [name] ([id number]) subcutaneous tissue exposure, central area of ulcer with tendon/periosteum exposed. The left lateral malleolus ulcer now with central area of nonviable tissue, otherwise deep tissue injury. She is wearing compression wraps to the left lower extremity, she will place the right lower extremity compression  wraps on when she gets home. She will be out of town over the weekend and return next week and follow-up appointment. She completed her doxycycline  this morning 05/03/18 on evaluation today patient appears to be doing very well in regard to her right lateral ankle ulcer in general. At least she's showing some signs of improvement in this regard. Unfortunately she has some additional injury to the left lateral malleolus region which appears to be new likely even over the past several days. Again this determination is based on the overall appearance. With that being said the patient is obviously frustrated about this currently. 05/10/18-She is here in follow-up evaluation for bilateral lateral malleolar ulcers. She states she has purchased offloading shoes/boots and they will arrive tomorrow. She was asked to bring them in the office at next week's appointment so her provider is aware of product being utilized. She continues to sleep on right or left side, she has been encouraged to sleep on her back. The right lateral malleolus ulcer is precariously close to peri-osteum; will order xray. The left lateral malleolus ulcer is improved. Will switch back to santyl; she will follow up next week. 05/17/18 on evaluation today patient actually appears to be doing very well in regard to her malleolus her ulcers compared to last time I saw them. She does not seem to have as much in the way of contusion at this point which is great news. With that being said she does continue to have discomfort and I do believe that she is still continuing to benefit from the offloading/pressure reducing boots that were recommended. I think this is the key to trying to get this to heal up completely. 05/24/18 on evaluation today patient actually appears to be doing worse at this point in time unfortunately compared to her last week's evaluation. She is having really no increased pain which is good news unfortunately she does have more maceration in your theme and noted surrounding the right lateral ankle the left lateral ankle is not really is erythematous I do not see signs of  the overt cellulitis on that side. Unfortunately the wounds do not seem to have shown any signs of improvement since the last evaluation. She also has significant swelling especially on the right compared to previous some of this may be due to infection however also think that she may be served better while she has these wounds by compression wrapping versus continuing to use the Juxta-Lite for the time being. Especially with the amount of drainage that she is experiencing at this point. No fevers, chills, nausea, or vomiting noted at this time. 05/31/18 on evaluation today patient appears to actually be doing better in regard to her right lateral lower extremity ulcer specifically on the malleolus region. She has been tolerating the antibiotic without complication. With that being said she still continues to have issues but a little bit of redness although nothing like she what she was experiencing previous. She still continues to pressure to her ankle area she did get the problem on offloading boots unfortunately she will not wear them she states there too uncomfortable and she can't get in and out of the bed. Nonetheless at this point her wounds seem to be continually getting worse which is not what we want I'm getting somewhat concerned about her progress and how things are going to proceed if we do not intervene in some way shape or form. I therefore had a very lengthy conversation today about offloading yet again  and even made a specific suggestion for switching her to a memory foam mattress and even gave the information for a specific one that they could look at getting if it was something that they were interested in considering. She does not want to be considered for a hospital bed air mattress although honestly insurance would not cover it that she does not have any wounds on her trunk. 06/14/18 on evaluation today both wounds over the bilateral lateral malleolus her ulcers appear to be doing  better there's no evidence of pressure injury at this point. She did get the foam mattress for her bed and this does seem to have been extremely beneficial for her in my pinion. Her daughter states that she is having difficulty getting out of bed because of how soft it is. The patient also relates this to be. Nonetheless I do feel like she's actually doing better. Unfortunately right after and around the time she was getting the mattress she also sustained a fall when she got up to go pick up the phone and ended up injuring her right elbow she has [address] sut[address] place. We are not caring for this currently although home health is going to be taking the sutures out shortly. Nonetheless this may be something that we need to evaluate going forward. It depends on how well it has or has not healed in the end. She also recently saw an orthopedic specialist for an injection in the right shoulder just before her fall unfortunately the fall seems to have worsened her pain. 06/21/18 on evaluation today patient appears to be doing about the same in regard to her lateral malleolus ulcers. Both appear to be just a little bit deeper but again we are clinging away the necrotic and dead tissue which I think is why this is progressing towards a deeper realm as opposed to improving from my measurement standpoint in that regard. Nonetheless she has been tolerating the dressing changes she absolutely hates the memory foam mattress topper that was obtained for her nonetheless I do believe this is still doing excellent as far as taking care of excess pressure in regard to the lateral malleolus regions. She in fact has no pressure injury that I see whereas in weeks past it was week by week I was constantly seeing new pressure injuries. Overall I think it has been very beneficial for her. 07/03/18; patient arrives in my clinic today. She has deep punched out areas over her bilateral lateral malleoli. The area on the right has  some more depth. DREAMER, CARILLO ([id number]) We spent a lot of time today talking about pressure relief for these areas. This started when her daughter asked for a prescription for a memory foam mattress. I have never written a prescription for a mattress and I don't think insurances would pay for that on an ordinary bed. In any case he came up that she has foam boots that she refuses to wear. I would suggest going to these before any other offloading issues when she is in bed. They say she is meticulous about offloading this the rest of the day 07/10/18- She is seen in follow-up evaluation for bilateral, lateral malleolus ulcers. There is no improvement in the ulcers. She has purchased and is sleeping on a memory foam mattress/overlay, she has been using the offloading boots nightly over the past week. She has a follow up appointment with vascular medicine at the end of October, in my opinion this follow up should be  expedited given her deterioration and suboptimal TBI results. We will order plain film xray of the left ankle as deeper structures are palpable; would consider having MRI, regardless of xray report(s). The ulcers will be treated with iodoflex/iodosorb, she is unable to safely change the dressings daily with santyl. 07/19/18 on evaluation today patient appears to be doing in general visually well in regard to her bilateral lateral malleolus ulcers. She has been tolerating the dressing changes without complication which is good news. With that being said we did have an x-ray performed on 07/12/18 which revealed a slight loosen see in the lateral portion of the distal left fibula which may represent artifact but underline lytic destruction or osteomyelitis could not be excluded. MRI was recommended. With that being said we can see about getting the patient scheduled for an MRI to further evaluate this area. In fact we have that scheduled currently for August 20 19,019. 07/26/18 on  evaluation today patient's wound on the right lateral ankle actually appears to be doing fairly well at this point in my pinion. She has made some good progress currently. With that being said unfortunately in regard to the left lateral ankle ulcer this seems to be a little bit more problematic at this time. In fact as I further evaluated the situation she actually had bone exposed which is the first time that's been the case in the bone appear to be necrotic. Currently I did review patient's note from Dr. Bunnie Domino office with [facility]. He stated that ABI was 1.26 on the right and 0.95 on the left with good waveforms. Her perfusion is stable not reduced from previous studies and her digital waveforms were pretty good particularly on the right. His conclusion upon review of the note was that there was not much she could do to improve her perfusion and he felt she was adequate for wound healing. His suggestion was that she continued to see [place] and consider a synthetic skin graft if there was no underlying infection. He plans to see her back in six months or as needed. 08/01/18 on evaluation today patient appears to be doing better in regard to her right lateral ankle ulcer. Her left lateral ankle ulcer is about the same she still has bone involvement in evidence of necrosis. There does not appear to be evidence of infection at this time On the right lateral lower extremity. I have started her on the Augmentin she picked this up and started this yesterday. This is to get her through until she sees infectious disease which is scheduled for 08/12/18. 08/06/18 on evaluation today patient appears to be doing rather well considering my discussion with patient's daughter at the end of last week. The area which was marked where she had erythema seems to be improved and this is good news. With that being said overall the patient seems to be making good improvement when it comes to the overall  appearance of the right lateral ankle ulcer although this has been slow she at least is coming around in this regard. Unfortunately in regard to the left lateral ankle ulcer this is osteomyelitis based on the pathology report as well is bone culture. Nonetheless we are still waiting CT scan. Unfortunately the MRI we originally ordered cannot be performed as the patient is a pacemaker which I had overlooked. Nonetheless we are working on the CT scan approval and scheduling as of now. She did go to the hospital over the weekend and was placed  on IV Cefzo for a couple of days. Fortunately this seems to have improved the erythema quite significantly which is good news. There does not appear to be any evidence of worsening infection at this time. She did have some bleeding after the last debridement therefore I did not perform any sharp debridement in regard to left lateral ankle at this point. Patient has been approved for a snap vac for the right lateral ankle. 08/14/18; the patient with wounds over her bilateral lateral malleoli. The area on the right actually looks quite good. Been using a snap back on this area. Healthy granulation and appears to be filling in. Unfortunately the area on the left is really problematic. 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 Also noted to have cellulitis. She saw Dr. Novella Olive of infectious disease today and was put on linezolid. We are able to verify this with her pharmacy. She is completed the Augmentin that she was already on. We've been using Iodoflex to this area Netzer, [name] ([id number]) Objective Constitutional Sitting or standing Blood Pressure is within target range for patient.. Pulse regular and within target range for patient.Marland Kitchen Respirations regular, non-labored and within target range.. Temperature is normal and within the target range for the patient.Marland Kitchen appears in no  distress. Vitals Time Taken: 4:07 PM, Height: 65 in, Weight: 154.3 lbs, BMI: 25.7, Temperature: 98.5 F, Pulse: 79 bpm, Respiratory Rate: 18 breaths/min, Blood Pressure: 139/65 mmHg. Respiratory Respiratory effort is easy and symmetric bilaterally. Rate is normal at rest and on room air.. Cardiovascular palpable dorsalis pedis pulses. General Notes: wound exam The area on the right lateral malleolus has a nice well granulated wound bed minimal depth. Using a snap back to this area which should continue Unfortunately the area over the left lateral malleolus is not nearly as good. She has some not very viable looking material over the bone itself. However inferiorly at roughly 4 to 8:00 there is a deep probing area that goes deep into the soft tissue over bone. Necrotic material from the surface of this was removed and a part of this was devitalized tendon. I saw no purulent drainage but this wound has considerable depth Integumentary (Hair, Skin) Wound #1 status is Open. Original cause of wound was Gradually Appeared. The wound is located on the Right,Lateral Malleolus. The wound measures 1.4cm length x 1.2cm width x 0.4cm depth; 1.319cm^2 area and 0.528cm^3 volume. There is Fat Layer (Subcutaneous Tissue) Exposed exposed. There is no tunneling or undermining noted. There is a large amount of serosanguineous drainage noted. Foul odor after cleansing was noted. The wound margin is distinct with the outline attached to the wound base. There is small (1-33%) pink granulation within the wound bed. There is a large (67-100%) amount of necrotic tissue within the wound bed including Eschar and Adherent Slough. The periwound skin appearance exhibited: Maceration, Rubor, Erythema. The periwound skin appearance did not exhibit: Callus, Crepitus, Excoriation, Induration, Rash, Scarring, Dry/Scaly, Atrophie Blanche, Cyanosis, Ecchymosis, Hemosiderin Staining, Mottled, Pallor. The surrounding wound skin  color is noted with erythema which is circumferential. Periwound temperature was noted as No Abnormality. The periwound has tenderness on palpation. Wound #2 status is Open. Original cause of wound was Gradually Appeared. The wound is located on the Left,Lateral Malleolus. The wound measures 2cm length x 1.7cm width x 1.5cm depth; 2.67cm^2 area and 4.006cm^3 volume. There is Fat Layer (Subcutaneous Tissue) Exposed exposed. There is no tunneling  or undermining noted. There is a large amount of purulent drainage noted. Foul odor after cleansing was noted. The wound margin is flat and intact. There is small (1-33%) pink granulation within the wound bed. There is a large (67-100%) amount of necrotic tissue within the wound bed including Eschar and Adherent Slough. The periwound skin appearance exhibited: Maceration, Erythema. The periwound skin appearance did not exhibit: Callus, Crepitus, Excoriation, Induration, Rash, Scarring, Dry/Scaly, Atrophie Blanche, Cyanosis, Ecchymosis, Hemosiderin Staining, Mottled, Pallor, Rubor. The surrounding wound skin color is noted with erythema which is circumferential. Periwound temperature was noted as No Abnormality. The periwound has tenderness on palpation. 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 There was a Excisional Skin/Subcutaneous Tissue Debridement with a total area of 3.4 sq cm performed by Ricard Dillon, MD.  With the following instrument(s): Blade, and Forceps to remove Viable and Non-Viable tissue/material. Material removed includes Subcutaneous Tissue after achieving pain control using Lidocaine 4% Topical Solution. No specimens were taken. A time out was conducted at 16:44, prior to the start of the procedure. A Moderate amount of bleeding was controlled with Pressure. The procedure was tolerated well with a pain level of 0 throughout and a pain level of 0 following the procedure. Patient s Level of Consciousness post procedure was recorded as Awake and Alert. Post Debridement Measurements: 2cm length x 1.7cm width x 2cm depth; 5.341cm^3 volume. Post debridement Stage noted as Category/Stage IV. Character of Wound/Ulcer Post Debridement is improved. Severity of Tissue Post Debridement is: Bone involvement without necrosis. Post procedure Diagnosis Wound #2: Same as Pre-Procedure Plan Wound Cleansing: Wound #1 Right,Lateral Malleolus: Clean wound with Normal Saline. Cleanse wound with mild soap and water May Shower, gently pat wound dry prior to applying new dressing. Wound #2 Left,Lateral Malleolus: Clean wound with Normal Saline. Cleanse wound with mild soap and water May Shower, gently pat wound dry prior to applying new dressing. Anesthetic (add to Medication List): Wound #1 Right,Lateral Malleolus: Topical Lidocaine 4% cream applied to wound bed prior to debridement (In Clinic Only). Wound #2 Left,Lateral Malleolus: Topical Lidocaine 4% cream applied to wound bed prior to debridement (In Clinic Only). Primary Wound Dressing: Wound #2 Left,Lateral Malleolus: Silver Alginate - packed into tunnel Secondary Dressing: Wound #2 Left,Lateral Malleolus: Boardered Foam Dressing Dressing Change Frequency: Wound #1 Right,Lateral Malleolus: Change dressing every week - HHRN may remove SNAP vac and use iodoflex and bordered foam dressing if SNAP vac fills [place], [name] ([id number]) up  or becomes soiled Wound #2 Left,Lateral Malleolus: Change Dressing Monday, Wednesday, Friday Follow-up Appointments: Wound #1 Right,Lateral Malleolus: Return Appointment in 1 week. Wound #2 Left,Lateral Malleolus: Return Appointment in 1 week. Edema Control: Wound #1 Right,Lateral Malleolus: Patient to wear own Velcro compression garment. Elevate legs to the level of the heart and pump ankles as often as possible Wound #2 Left,Lateral Malleolus: Patient to wear own Velcro compression garment. Elevate legs to the level of the heart and pump ankles as often as possible Off-Loading: Wound #1 Right,Lateral Malleolus: Mattress Turn and reposition every 2 hours Other: -  do not put pressure on your right ankle Wound #2 Left,Lateral Malleolus: Mattress Turn and reposition every 2 hours Other: - do not put pressure on your right ankle Additional Orders / Instructions: Wound #1 Right,Lateral Malleolus: Increase protein intake. Wound #2 Left,Lateral Malleolus: Increase protein intake. Home Health: Wound #1 Right,Lateral Malleolus: [place] Visits - [place] Nurse may visit PRN to address patient s wound care needs. FACE TO FACE ENCOUNTER: MEDICARE and MEDICAID PATIENTS: I certify that this patient is under my care and that I had a face-to-face encounter that meets the physician face-to-face encounter requirements with this patient on this date. The encounter with the patient was in whole or in part for the following MEDICAL CONDITION: (primary reason for [place]) MEDICAL NECESSITY: I certify, that based on my findings, NURSING services are a medically necessary home health service. HOME BOUND STATUS: I certify that my clinical findings support that this patient is homebound (i.e., Due to illness or injury, pt requires aid of supportive devices such as crutches, cane, wheelchairs, walkers, the use of special transportation or the assistance of another person  to leave their place of residence. There is a normal inability to leave the home and doing so requires considerable and taxing effort. Other absences are for medical reasons / religious services and are infrequent or of short duration when for other reasons). If current dressing causes regression in wound condition, may D/C ordered dressing product/s and apply Normal Saline Moist Dressing daily until next [place] / Other MD appointment. [place] of regression in wound condition at [phone]. Please direct any NON-WOUND related issues/requests for orders to patient's Primary Care Physician Wound #2 Left,Lateral Malleolus: [place] Visits - [place] Nurse may visit PRN to address patient s wound care needs. FACE TO FACE ENCOUNTER: MEDICARE and MEDICAID PATIENTS: I certify that this patient is under my care and that I had a face-to-face encounter that meets the physician face-to-face encounter requirements with this patient on this date. The encounter with the patient was in whole or in part for the following MEDICAL CONDITION: (primary reason for [place]) MEDICAL NECESSITY: I certify, that based on my findings, NURSING services are a medically necessary home health service. HOME BOUND STATUS: I certify that my clinical findings support that this patient is homebound (i.e., Due to illness or injury, pt requires aid of supportive devices such as crutches, cane, wheelchairs, walkers, the use of special transportation or the assistance of another person to leave their place of residence. There is a normal inability to leave the home and doing so requires considerable and taxing effort. Other absences are for medical reasons / religious services and are infrequent or of short duration when for other reasons). If current dressing causes regression in wound condition, may D/C ordered dressing product/s and apply Normal Saline Moist  Dressing daily until next [place] / Other MD appointment. [facility] of regression in SHONI, QUIJAS ([id number]) wound condition at [phone]. Please direct any NON-WOUND related issues/requests for orders to patient's Primary Care Physician #1 continue to snap back to the right ankle wound #2 change the primary dressing on the left this over alginate the packed into the deep recess of this #3 she starts on linezolid tomorrow. As I understand things this as fairly good bone penetration. Dr. Arelia Longest is going to follow-up with her in 2 weeks. Would wonder about using a snap back on the left as  well if we can be certain that the infection part of this is controlled #4 she is seen vascular surgery she was felt to have adequate blood flow per my brief review of their consult #5 hopefully we can contain the infection in the left lateral malleolus. Otherwise this may become very difficult clinical perhaps even limb threatening problem Electronic Signature(s) Signed: 08/14/2018 5:30:50 PM By: Linton Ham MD Entered By: Linton Ham on 08/14/2018 17:29:29 [place], [name] ([id number]) -------------------------------------------------------------------------------- SuperBill Details Patient Name: [name] Date of Service: 08/14/2018 Medical Record Number: [id number] Patient Account Number: [id number] Date of Birth/Sex: Jan 22, 1923 (84 y.o. 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 I70.243 Atherosclerosis of native arteries of left leg with ulceration of ankle I89.0 Lymphedema,  not elsewhere classified B35.4 Tinea corporis 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, left ankle and foot Facility Procedures CPT4 Code: 33825053 Description: 97673 - DEB SUBQ TISSUE 20 SQ CM/< ICD-10 Diagnosis Description L89.524 Pressure ulcer of left ankle, stage 4 Modifier: Quantity: 1 Physician Procedures CPT4 Code: 4193790 Description: 24097 - WC PHYS SUBQ TISS 20 SQ CM ICD-10 Diagnosis Description L89.524 Pressure ulcer of left ankle, stage 4 Modifier: Quantity: 1 Electronic Signature(s) Signed: 08/14/2018 5:30:50 PM By: Linton Ham MD Entered By: Linton Ham on 08/14/2018 17:29:47

## 2018-08-20 ENCOUNTER — Ambulatory Visit: Payer: Medicare Other

## 2018-08-23 ENCOUNTER — Encounter: Payer: Medicare Other | Admitting: Physician Assistant

## 2018-08-23 DIAGNOSIS — I70243 Atherosclerosis of native arteries of left leg with ulceration of ankle: Secondary | ICD-10-CM | POA: Diagnosis not present

## 2018-08-26 NOTE — Progress Notes (Signed)
[name], [name] ([id number]) Visit Report for 08/23/2018 Chief Complaint Document Details Patient Name: [name], [name]. Date of Service: 08/23/2018 11:00 AM Medical Record Number: [id number] Patient Account Number: [id number] Date of Birth/Sex: 12-07-1922 (84 y.o. F) Treating RN: Montey Hora Primary Care Provider: Grayland Ormond Other Clinician: Referring Provider: Grayland Ormond Treating Provider/Extender: Melburn Hake, Ronnald Shedden Weeks in Treatment: 31 Information Obtained from: Patient Chief Complaint Patient is here for right lateral malleolus and left lateral malleolus ulcer Electronic Signature(s) Signed: 08/26/2018 8:08:51 AM By: Worthy Keeler PA-C Entered By: Worthy Keeler on 08/23/2018 11:36:24 Skillin, [name] ([id number]) -------------------------------------------------------------------------------- Debridement Details Patient Name: [name] Date of Service: 08/23/2018 11:00 AM Medical Record Number: [id number] Patient Account Number: [id number] Date of Birth/Sex: 1923/04/25 (84 y.o. F) Treating RN: Montey Hora Primary Care Provider: Grayland Ormond Other Clinician: Referring Provider: Grayland Ormond Treating Provider/Extender: Melburn Hake, Benn Tarver Weeks in Treatment: 72 Debridement Performed for Wound #1 Right,Lateral Malleolus Assessment: Performed By: Physician STONE III, Aixa Corsello E., PA-C Debridement Type: Debridement Severity of Tissue Pre Fat layer exposed Debridement: Level of Consciousness (Pre- Awake and Alert procedure): Pre-procedure Verification/Time Yes - 12:06 Out Taken: Start Time: 12:06 Pain Control: Lidocaine 4% Topical Solution Total Area Debrided (L x W): 1.2 (cm) x 1.4 (cm) = 1.68 (cm) Tissue and other material Viable, Non-Viable, Slough, Subcutaneous, Slough debrided: Level: Skin/Subcutaneous Tissue Debridement Description: Excisional Instrument: Curette Bleeding: Minimum Hemostasis Achieved: Pressure End Time:  12:08 Procedural Pain: 0 Post Procedural Pain: 0 Response to Treatment: Procedure was tolerated well Level of Consciousness Awake and Alert (Post-procedure): Post Debridement Measurements of Total Wound Length: (cm) 1.2 Stage: Category/Stage IV Width: (cm) 1.4 Depth: (cm) 0.4 Volume: (cm) 0.528 Character of Wound/Ulcer Post Improved Debridement: Severity of Tissue Post Debridement: Fat layer exposed Post Procedure Diagnosis Same as Pre-procedure Electronic Signature(s) Signed: 08/23/2018 5:18:43 PM By: Montey Hora Signed: 08/26/2018 8:08:51 AM By: Worthy Keeler PA-C Entered By: Montey Hora on 08/23/2018 12:08:23 [name] ([id number]) -------------------------------------------------------------------------------- HPI Details Patient Name: [name] Date of Service: 08/23/2018 11:00 AM Medical Record Number: [id number] Patient Account Number: [id number] Date of Birth/Sex: 1923-09-11 (84 y.o. 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 Dr. Amalia Hailey the podiatrist saw her on 07/20/2017 and sent her to the wound center for possible hyperbaric oxygen therapy. past medical history of peripheral vascular disease, varicose veins, status post appendectomy, basal cell carcinoma excision from the left leg, cholecystectomy, pacemaker placement, right lower extremity angiography done by Dr. dew in March 2017 with placement of a stent. there is also note of a successful ablation of the right small saphenous vein done which was reviewed by ultrasound on 10/24/2016. the patient had a  right small saphenous vein ablation done on 10/20/2016. The patient has never been a smoker. She has been seen by Dr. Corene Cornea dew the vascular surgeon who most recently saw her on 06/15/2017 for evaluation of ongoing problems with right leg swelling. She had a lower extremity arterial duplex examination done(02/13/17) which showed patent distal right superficial femoral artery stent and above-the-knee popliteal stent without evidence of restenosis. The ABI was more than 1.3 on the right and more than 1.3 on the left. This was  consistent with noncompressible arteries due to medial calcification. The right great toe pressure and PPG waveforms are within normal limits and the left great toe pressure and PPG waveforms are decreased. he recommended she continue to wear her compression stockings and continue with elevation. She is scheduled to have a noninvasive arterial study in the near future 08/16/2017 -- had a lower extremity arterial duplex examination done which showed patent distal right superficial femoral artery stent and above-the-knee popliteal stent without evidence of restenosis. The ABI was more than 1.3 on the right and more than 1.3 on the left. This was consistent with noncompressible arteries due to medial calcification. The right great toe pressure and PPG waveforms are within normal limits and the left great toe pressure and PPG waveforms are decreased. the x-ray of the right ankle has not yet been done 08/24/2017 -- had a right ankle x-ray -- IMPRESSION:1. No fracture, bone lesion or evidence of osteomyelitis. 2. Lateral soft tissue swelling with a soft tissue ulcer. she has not yet seen the vascular surgeon for review 08/31/17 on evaluation today patient's wound appears to be showing signs of improvement. She still with her appointment with vascular in order to review her results of her vascular study and then determine if any intervention would be recommended at that time. No  fevers, chills, nausea, or vomiting noted at this time. She has been tolerating the dressing changes without complication. 09/28/17 on evaluation today patient's wound appears to show signs of good improvement in regard to the granulation tissue which is surfacing. There is still a layer of slough covering the wound and the posterior portion is still significantly deeper than the anterior nonetheless there has been some good sign of things moving towards the better. She is going to go back to Dr. dew for reevaluation to ensure her blood flow is still appropriate. That will be before her next evaluation with [place] next week. No fevers, chills, nausea, or vomiting noted at this time. Patient does have some discomfort rated to be a 3-4/10 depending on activity specifically cleansing the wound makes it worse. 10/05/2017 -- the patient was seen by Dr. Lucky Cowboy last week and noninvasive studies showed a normal right ABI with brisk triphasic waveforms consistent with no arterial insufficiency including normal digital pressures. The duplex showed a patent distal right SFA stent and the proximal SFA was also normal. He was pleased with her test and thought she should have enough of perfusion for normal wound healing. He would see her back in 6 months time. 12/21/17 on evaluation today patient appears to be doing fairly well in regard to her right lateral ankle wound. Unfortunately the main issue that she is expansion at this point is that she is having some issues with what appears to be some cellulitis in the [name], [name]. ([id number]) right anterior shin. She has also been noting a little bit of uncomfortable feeling especially last night and her ankle area. I'm afraid that she made the developing a little bit of an infection. With that being said I think it is in the early stages. 12/28/17 on evaluation today patient's ankle appears to be doing excellent. She's making good progress at this point  the cellulitis seems to have improved after last week's evaluation. Overall she is having no significant discomfort which is excellent news. She does have an appointment with Dr. dew on March 29, 2018 for reevaluation in regard to the stent he placed. She seems to have excellent blood flow in the  right lower extremity. 01/19/12 on evaluation today patient's wound appears to be doing very well. In fact she does not appear to require debridement at this point, there's no evidence of infection, and overall from the standpoint of the wound she seems to be doing very well. With that being said I believe that it may be time to switch to different dressing away from the [facility] Dressing she tells me she does have a lot going on her friend actually passed away yesterday and she's also having a lot of issues with her husband this obviously is weighing heavy on her as far as your thoughts and concerns today. 01/25/18 on evaluation today patient appears to be doing fairly well in regard to her right lateral malleolus. She has been tolerating the dressing changes without complication. Overall I feel like this is definitely showing signs of improvement as far as how the overall appearance of the wound is there's also evidence of epithelium start to migrate over the granulation tissue. In general I think that she is progressing nicely as far as the wound is concerned. The only concern she really has is whether or not we can switch to every other week visits in order to avoid having as many appointments as her daughters have a difficult time getting her to her appointments as well as the patient's husband to his he is not doing very well at this point. 02/22/18 on evaluation today patient's right lateral malleolus ulcer appears to be doing great. She has been tolerating the dressing changes without complication. Overall you making excellent progress at this time. Patient is having no  significant discomfort. 03/15/18 on evaluation today patient appears to be doing much more poorly in regard to her right lateral ankle ulcer at this point. Unfortunately since have last seen her her husband has passed just a few days ago is obviously weighed heavily on her her daughter also had surgery well she is with her today as usual. There does not appear to be any evidence of infection she does seem to have significant contusion/deep tissue injury to the right lateral malleolus which was not noted previous when I saw her last. It's hard to tell of exactly when this injury occurred although during the time she was spending the night in the hospital this may have been most likely. 03/22/18 on evaluation today patient appears to actually be doing very well in regard to her ulcer. She did unfortunately have a setback which was noted last week however the good news is we seem to be getting back on track and in fact the wound in the core did still have some necrotic tissue which will be addressed at this point today but in general I'm seeing signs that things are on the up and up. She is glad to hear this obviously she's been somewhat concerned that due to the how her wound digressed more recently. 03/29/18 on evaluation today patient appears to be doing fairly well in regard to her right lower extremity lateral malleolus ulcer. She unfortunately does have a new area of pressure injury over the inferior portion where the wound has opened up a little bit larger secondary to the pressure she seems to be getting. She does tell me sometimes when she sleeps at night that it actually hurts and does seem to be pushing on the area little bit more unfortunately. There does not appear to be any evidence of infection which is good news. She has been tolerating the dressing changes  without complication. She also did have some bruising in the left second and third toes due to the fact that she may have bump this  or injured it although she has neuropathy so she does not feel she did move recently that may have been where this came from. Nonetheless there does not appear to be any evidence of infection at this time. 04/12/18 on evaluation today patient's wound on the right lateral ankle actually appears to be doing a little bit better with a lot of necrotic docking tissue centrally loosening up in clearing away. However she does have the beginnings of a deep tissue injury on the left lateral malleolus likely due to the fact we've been trying offload the right as much as we have. I think she may benefit from an assistive soft device to help with offloading and it looks like they're looking at one of the doughnut conditions that wraps around the lower leg to offload which I think will definitely do a good job. With that being said I think we definitely need to address this issue on the left before it becomes a wound. Patient is not having significant pain. 04/19/18 on evaluation today patient appears to be doing excellent in regard to the progress she's made with her right lateral ankle ulcer. The left ankle region which did show evidence of a deep tissue injury seems to be resolving there's little fluid noted underneath and a blister there's nothing open at this point in time overall I feel like this is progressing nicely which is good news. She does not seem to be having significant discomfort at this point which is also good news. 04/25/18-She is here in follow up evaluation for bilateral lateral malleolar ulcers. The right lateral malleolus ulcer with pale subcutaneous tissue exposure, central area of ulcer with tendon/periosteum exposed. The left lateral malleolus ulcer now with Whidden, [name] ([id number]) central area of nonviable tissue, otherwise deep tissue injury. She is wearing compression wraps to the left lower extremity, she will place the right lower extremity compression wraps on when she gets  home. She will be out of town over the weekend and return next week and follow-up appointment. She completed her doxycycline this morning 05/03/18 on evaluation today patient appears to be doing very well in regard to her right lateral ankle ulcer in general. At least she's showing some signs of improvement in this regard. Unfortunately she has some additional injury to the left lateral malleolus region which appears to be new likely even over the past several days. Again this determination is based on the overall appearance. With that being said the patient is obviously frustrated about this currently. 05/10/18-She is here in follow-up evaluation for bilateral lateral malleolar ulcers. She states she has purchased offloading shoes/boots and they will arrive tomorrow. She was asked to bring them in the office at next week's appointment so her provider is aware of product being utilized. She continues to sleep on right or left side, she has been encouraged to sleep on her back. The right lateral malleolus ulcer is precariously close to peri-osteum; will order xray. The left lateral malleolus ulcer is improved. Will switch back to santyl; she will follow up next week. 05/17/18 on evaluation today patient actually appears to be doing very well in regard to her malleolus her ulcers compared to last time I saw them. She does not seem to have as much in the way of contusion at this point which is great news. With that being  said she does continue to have discomfort and I do believe that she is still continuing to benefit from the offloading/pressure reducing boots that were recommended. I think this is the key to trying to get this to heal up completely. 05/24/18 on evaluation today patient actually appears to be doing worse at this point in time unfortunately compared to her last week's evaluation. She is having really no increased pain which is good news unfortunately she does have more maceration in your  theme and noted surrounding the right lateral ankle the left lateral ankle is not really is erythematous I do not see signs of the overt cellulitis on that side. Unfortunately the wounds do not seem to have shown any signs of improvement since the last evaluation. She also has significant swelling especially on the right compared to previous some of this may be due to infection however also think that she may be served better while she has these wounds by compression wrapping versus continuing to use the Juxta-Lite for the time being. Especially with the amount of drainage that she is experiencing at this point. No fevers, chills, nausea, or vomiting noted at this time. 05/31/18 on evaluation today patient appears to actually be doing better in regard to her right lateral lower extremity ulcer specifically on the malleolus region. She has been tolerating the antibiotic without complication. With that being said she still continues to have issues but a little bit of redness although nothing like she what she was experiencing previous. She still continues to pressure to her ankle area she did get the problem on offloading boots unfortunately she will not wear them she states there too uncomfortable and she can't get in and out of the bed. Nonetheless at this point her wounds seem to be continually getting worse which is not what we want I'm getting somewhat concerned about her progress and how things are going to proceed if we do not intervene in some way shape or form. I therefore had a very lengthy conversation today about offloading yet again and even made a specific suggestion for switching her to a memory foam mattress and even gave the information for a specific one that they could look at getting if it was something that they were interested in considering. She does not want to be considered for a hospital bed air mattress although honestly insurance would not cover it that she does not have any  wounds on her trunk. 06/14/18 on evaluation today both wounds over the bilateral lateral malleolus her ulcers appear to be doing better there's no evidence of pressure injury at this point. She did get the foam mattress for her bed and this does seem to have been extremely beneficial for her in my pinion. Her daughter states that she is having difficulty getting out of bed because of how soft it is. The patient also relates this to be. Nonetheless I do feel like she's actually doing better. Unfortunately right after and around the time she was getting the mattress she also sustained a fall when she got up to go pick up the phone and ended up injuring her right elbow she has 18 sutures in place. We are not caring for this currently although home health is going to be taking the sutures out shortly. Nonetheless this may be something that we need to evaluate going forward. It depends on how well it has or has not healed in the end. She also recently saw an orthopedic specialist for an injection  in the right shoulder just before her fall unfortunately the fall seems to have worsened her pain. 06/21/18 on evaluation today patient appears to be doing about the same in regard to her lateral malleolus ulcers. Both appear to be just a little bit deeper but again we are clinging away the necrotic and dead tissue which I think is why this is progressing towards a deeper realm as opposed to improving from my measurement standpoint in that regard. Nonetheless she has been tolerating the dressing changes she absolutely hates the memory foam mattress topper that was obtained for her nonetheless I do believe this is still doing excellent as far as taking care of excess pressure in regard to the lateral malleolus regions. She in fact has no pressure injury that I see whereas in weeks past it was week by week I was constantly seeing new pressure injuries. Overall I think it has been very beneficial for her. 07/03/18;  patient arrives in my clinic today. She has deep punched out areas over her bilateral lateral malleoli. The area on the right has some more depth. [name], [name] ([id number]) We spent a lot of time today talking about pressure relief for these areas. This started when her daughter asked for a prescription for a memory foam mattress. I have never written a prescription for a mattress and I don't think insurances would pay for that on an ordinary bed. In any case he came up that she has foam boots that she refuses to wear. I would suggest going to these before any other offloading issues when she is in bed. They say she is meticulous about offloading this the rest of the day 07/10/18- She is seen in follow-up evaluation for bilateral, lateral malleolus ulcers. There is no improvement in the ulcers. She has purchased and is sleeping on a memory foam mattress/overlay, she has been using the offloading boots nightly over the past week. She has a follow up appointment with vascular medicine at the end of October, in my opinion this follow up should be expedited given her deterioration and suboptimal TBI results. We will order plain film xray of the left ankle as deeper structures are palpable; would consider having MRI, regardless of xray report(s). The ulcers will be treated with iodoflex/iodosorb, she is unable to safely change the dressings daily with santyl. 07/19/18 on evaluation today patient appears to be doing in general visually well in regard to her bilateral lateral malleolus ulcers. She has been tolerating the dressing changes without complication which is good news. With that being said we did have an x-ray performed on 07/12/18 which revealed a slight loosen see in the lateral portion of the distal left fibula which may represent artifact but underline lytic destruction or osteomyelitis could not be excluded. MRI was recommended. With that being said we can see about getting the patient  scheduled for an MRI to further evaluate this area. In fact we have that scheduled currently for August 20 19,019. 07/26/18 on evaluation today patient's wound on the right lateral ankle actually appears to be doing fairly well at this point in my pinion. She has made some good progress currently. With that being said unfortunately in regard to the left lateral ankle ulcer this seems to be a little bit more problematic at this time. In fact as I further evaluated the situation she actually had bone exposed which is the first time that's been the case in the bone appear to be necrotic. Currently I did review patient's  note from Dr. Bunnie Domino office with [facility]. He stated that ABI was 1.26 on the right and 0.95 on the left with good waveforms. Her perfusion is stable not reduced from previous studies and her digital waveforms were pretty good particularly on the right. His conclusion upon review of the note was that there was not much she could do to improve her perfusion and he felt she was adequate for wound healing. His suggestion was that she continued to see [place] and consider a synthetic skin graft if there was no underlying infection. He plans to see her back in six months or as needed. 08/01/18 on evaluation today patient appears to be doing better in regard to her right lateral ankle ulcer. Her left lateral ankle ulcer is about the same she still has bone involvement in evidence of necrosis. There does not appear to be evidence of infection at this time On the right lateral lower extremity. I have started her on the Augmentin she picked this up and started this yesterday. This is to get her through until she sees infectious disease which is scheduled for 08/12/18. 08/06/18 on evaluation today patient appears to be doing rather well considering my discussion with patient's daughter at the end of last week. The area which was marked where she had erythema seems to be improved and  this is good news. With that being said overall the patient seems to be making good improvement when it comes to the overall appearance of the right lateral ankle ulcer although this has been slow she at least is coming around in this regard. Unfortunately in regard to the left lateral ankle ulcer this is osteomyelitis based on the pathology report as well is bone culture. Nonetheless we are still waiting CT scan. Unfortunately the MRI we originally ordered cannot be performed as the patient is a pacemaker which I had overlooked. Nonetheless we are working on the CT scan approval and scheduling as of now. She did go to the hospital over the weekend and was placed on IV Cefzo for a couple of days. Fortunately this seems to have improved the erythema quite significantly which is good news. There does not appear to be any evidence of worsening infection at this time. She did have some bleeding after the last debridement therefore I did not perform any sharp debridement in regard to left lateral ankle at this point. Patient has been approved for a snap vac for the right lateral ankle. 08/14/18; the patient with wounds over her bilateral lateral malleoli. The area on the right actually looks quite good. Been using a snap back on this area. Healthy granulation and appears to be filling in. Unfortunately the area on the left is really problematic. 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 Also noted to have cellulitis. She saw Dr. Novella Olive of infectious disease today and was put on linezolid. We are able to verify this with her pharmacy. She is completed the Augmentin that she was already on. We've been using Iodoflex to this area 08/23/18 on evaluation today patient's wounds both actually appear to be doing better compared to my prior evaluations. Fortunately she showing signs of good improvement in regard to the overall wound  status especially where were using the snap vac on the right. In regard to left lateral malleolus the wound bed actually appears to be much cleaner than previously noted. I do not feel  any phone directly probed during evaluation today and though there is tendon noted this does not appear Pasley, [name] ([id number]) to be necrotic it's actually fairly good as far as the overall appearance of the tendon is concerned. In general the wound bed actually appears to be doing significantly better than it was previous. Patient is currently in the care of [name] and I did review that note today. He actually has her on two weeks of linezolid and then following the patient will be on 1-2 months of Keflex. That is the plan currently. She has been on antibiotics therapy as prescribed by myself initially starting on July 30, 2018 and has been on that continuously up to this point. Electronic Signature(s) Signed: 08/26/2018 8:08:51 AM By: Worthy Keeler PA-C Entered By: Worthy Keeler on 08/23/2018 14:26:31 [name], [name] ([id number]) -------------------------------------------------------------------------------- Physical Exam Details Patient Name: [name] Date of Service: 08/23/2018 11:00 AM Medical Record Number: [id number] Patient Account Number: [id number] Date of Birth/Sex: May 14, 1923 (84 y.o. F) Treating RN: Montey Hora Primary Care Provider: Grayland Ormond Other Clinician: Referring Provider: Grayland Ormond Treating Provider/Extender: STONE III, [place] Kirchner Weeks in Treatment: 25 Constitutional Well-nourished and well-hydrated in no acute distress. Respiratory normal breathing without difficulty. clear to auscultation bilaterally. Cardiovascular regular rate and rhythm with normal S1, S2. Psychiatric this patient is able to make decisions and demonstrates good insight into disease process. Alert and Oriented x 3. pleasant and cooperative. Notes At this time patient's  wound bed actually again at both locations show signs of improvement. In fact I think we may be very close to being able to utilize a snap vac on the left lateral malleolus as well in fact I probably would've considered doing that today if it had not been for the fact that the patient has not yet started on the linezolid that her infectious disease doctor, [name], is recommending for her at this point. She was just able to get that field yesterday. Nonetheless when she's been on this for a significantly appropriate amount of time I think we can switch over to a snap vac in regard to left lateral ankle as well. Electronic Signature(s) Signed: 08/26/2018 8:08:51 AM By: Worthy Keeler PA-C Entered By: Worthy Keeler on 08/23/2018 14:25:16 [name], [name] ([id number]) -------------------------------------------------------------------------------- Physician Orders Details Patient Name: [name] Date of Service: 08/23/2018 11:00 AM Medical Record Number: [id number] Patient Account Number: [id number] Date of Birth/Sex: November 07, 1923 (84 y.o. 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 o Cleanse wound with mild soap and water o May Shower, gently pat wound dry prior to applying new dressing. Wound #2  Left,Lateral Malleolus o Clean wound with Normal Saline. o Cleanse wound with mild soap and water o May Shower, gently pat wound dry prior to applying new dressing. Anesthetic (add to Medication List) Wound #1 Right,Lateral Malleolus o Topical Lidocaine 4% cream applied to wound bed prior to debridement (In  Clinic Only). Wound #2 Left,Lateral Malleolus o Topical Lidocaine 4% cream applied to wound bed prior to debridement (In Clinic Only). Primary Wound Dressing Wound #2 Left,Lateral Malleolus o Silver Alginate - over silver collagen o Silver Collagen - packed into tunnel with silver alginate over Secondary Dressing Wound #2 Left,Lateral Malleolus o Boardered Foam Dressing Dressing Change Frequency Wound #1 Right,Lateral Malleolus o Change dressing every week - HHRN may remove SNAP vac and use iodoflex and bordered foam dressing if SNAP vac fills up or becomes soiled [name], [name] ([id number]) Wound #2 Left,Lateral Malleolus o Change Dressing Monday, Wednesday, Friday Follow-up Appointments Wound #1 Right,Lateral Malleolus o Return Appointment in 1 week. Wound #2 Left,Lateral Malleolus o Return Appointment in 1 week. Edema Control Wound #1 Right,Lateral Malleolus o Patient to wear own Velcro compression garment. o Elevate legs to the level of the heart and pump ankles as often as possible Wound #2 Left,Lateral Malleolus o Patient to wear own Velcro compression garment. o Elevate legs to the level of the heart and pump ankles as often as possible Off-Loading Wound #1 Right,Lateral Malleolus o Mattress o Turn and reposition every 2 hours o Other: - do not put pressure on your right ankle Wound #2 Left,Lateral Malleolus o Mattress o Turn and reposition every 2 hours o Other: - do not put pressure on your right ankle Additional Orders / Instructions Wound #1 Right,Lateral Malleolus o Increase protein intake. Wound #2  Left,Lateral Malleolus o Increase protein intake. Home Health Wound #1 [place] Visits - [place] Nurse may visit PRN to address patientos wound care needs. o FACE TO FACE ENCOUNTER: MEDICARE and MEDICAID PATIENTS: I certify that this patient is under my care and that I had a face-to-face encounter that meets the physician face-to-face encounter requirements with this patient on this date. The encounter with the patient was in whole or in part for the following MEDICAL CONDITION: (primary reason for [place]) MEDICAL NECESSITY: I certify, that based on my findings, NURSING services are a medically necessary home health service. HOME BOUND STATUS: I certify that my clinical findings support that this patient is homebound (i.e., Due to illness or injury, pt requires aid of supportive devices such as crutches, cane, wheelchairs, walkers, the use of special transportation or the assistance of another person to leave their place of residence. There is a normal inability to leave the home and doing so requires considerable and taxing effort. Other absences are for medical reasons / religious services and are infrequent or of short duration when for other reasons). o If current dressing causes regression in wound condition, may D/C ordered dressing product/s and apply Normal Saline Moist Dressing daily until next [place] / Other MD appointment. [place] of regression in wound condition at [phone]. AYANA, IM[facility] ([id number]) o Please direct any NON-WOUND related issues/requests for orders to patient's Primary Care Physician Wound #2 [place] Visits - [place] Nurse may visit PRN to address patientos wound care needs. o FACE TO FACE ENCOUNTER: MEDICARE and MEDICAID PATIENTS: I certify that this patient is under my care and that I had  a face-to-face encounter that meets the physician face-to-face encounter requirements with this patient on this date. The encounter with the patient was in whole or in part for the following MEDICAL CONDITION: (primary reason for [place]) MEDICAL NECESSITY: I certify, that based on my findings, NURSING services are a medically  necessary home health service. HOME BOUND STATUS: I certify that my clinical findings support that this patient is homebound (i.e., Due to illness or injury, pt requires aid of supportive devices such as crutches, cane, wheelchairs, walkers, the use of special transportation or the assistance of another person to leave their place of residence. There is a normal inability to leave the home and doing so requires considerable and taxing effort. Other absences are for medical reasons / religious services and are infrequent or of short duration when for other reasons). o If current dressing causes regression in wound condition, may D/C ordered dressing product/s and apply Normal Saline Moist Dressing daily until next [place] / Other MD appointment. [place] of regression in wound condition at [phone]. o Please direct any NON-WOUND related issues/requests for orders to patient's Primary Care Physician Electronic Signature(s) Signed: 08/23/2018 5:18:43 PM By: Montey Hora Signed: 08/26/2018 8:08:51 AM By: Worthy Keeler PA-C Entered By: Montey Hora on 08/23/2018 12:09:50 Nies, [name] ([id number]) -------------------------------------------------------------------------------- Problem List Details Patient Name: [name] Date of Service: 08/23/2018 11:00 AM Medical Record Number: [id number] Patient Account Number: [id number] Date of Birth/Sex: 1923/03/15 (84 y.o. F) Treating RN: Montey Hora Primary Care Provider: Grayland Ormond Other Clinician: Referring Provider: Grayland Ormond Treating  Provider/Extender: Melburn Hake, Evette Diclemente Weeks in Treatment: 26 Active Problems ICD-10 Evaluated Encounter Code Description Active Date Today Diagnosis 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, stage 4 05/10/2018 No Yes L89.524 Pressure ulcer of left ankle, stage 4 04/25/2018 No Yes I70.233 Atherosclerosis of native arteries of right leg with ulceration of 08/10/2017 No Yes ankle I70.243 Atherosclerosis of native arteries of left leg with ulceration of 07/10/2018 No Yes ankle I89.0 Lymphedema, not elsewhere classified 08/10/2017 No Yes B35.4 Tinea corporis 09/28/2017 No Yes 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 F) Treating RN: Montey Hora Primary Care Provider: Grayland Ormond Other Clinician: Referring Provider: Grayland Ormond Treating Provider/Extender: Melburn Hake, [place] Hudgins Weeks in Treatment: 49 Subjective Chief Complaint Information obtained from Patient Patient is here for right lateral malleolus and left lateral malleolus ulcer History of Present Illness (HPI) 84 year old patient who most recently has been seeing both podiatry and vascular surgery for a long-standing ulcer of her right lateral malleolus which has been treated with various methodologies. Dr. Amalia Hailey the podiatrist saw her on 07/20/2017 and sent her to the wound center for possible hyperbaric oxygen therapy. past medical history of peripheral vascular disease, varicose veins, status post appendectomy, basal cell  carcinoma excision from the left leg, cholecystectomy, pacemaker placement, right lower extremity angiography done by Dr. dew in March 2017 with placement of a stent. there is also note of a successful ablation of the right small saphenous vein done which was reviewed by ultrasound on 10/24/2016. the patient had a right small saphenous vein ablation done on 10/20/2016. The patient has never been a smoker. She has been seen by Dr. Corene Cornea dew the vascular surgeon who most recently saw her on 06/15/2017 for evaluation of ongoing problems with right leg swelling. She had a lower extremity arterial duplex examination done(02/13/17) which showed patent distal right superficial femoral artery stent and above-the-knee  popliteal stent without evidence of restenosis. The ABI was more than 1.3 on the right and more than 1.3 on the left. This was consistent with noncompressible arteries due to medial calcification. The right great toe pressure and PPG waveforms are within normal limits and the left great toe pressure and PPG waveforms are decreased. he recommended she continue to wear her compression stockings and continue with elevation. She is scheduled to have a noninvasive arterial study in the near future 08/16/2017 -- had a lower extremity arterial duplex examination done which showed patent distal right superficial femoral artery stent and above-the-knee popliteal stent without evidence of restenosis. The ABI was more than 1.3 on the right and more than 1.3 on the left. This was consistent with noncompressible arteries due to medial calcification. The right great toe pressure and PPG waveforms are within normal limits and the left great toe pressure and PPG waveforms are decreased. the x-ray of the right ankle has not yet been done 08/24/2017 -- had a right ankle x-ray -- IMPRESSION:1. No fracture, bone lesion or evidence of osteomyelitis. 2. Lateral soft tissue swelling with a soft tissue ulcer. she  has not yet seen the vascular surgeon for review 08/31/17 on evaluation today patient's wound appears to be showing signs of improvement. She still with her appointment with vascular in order to review her results of her vascular study and then determine if any intervention would be recommended at that time. No fevers, chills, nausea, or vomiting noted at this time. She has been tolerating the dressing changes without complication. 09/28/17 on evaluation today patient's wound appears to show signs of good improvement in regard to the granulation tissue which is surfacing. There is still a layer of slough covering the wound and the posterior portion is still significantly deeper than the anterior nonetheless there has been some good sign of things moving towards the better. She is going to go back to Dr. dew for reevaluation to ensure her blood flow is still appropriate. That will be before her next evaluation with [place] next week. No fevers, chills, nausea, or vomiting noted at this time. Patient does have some discomfort rated to be a 3-4/10 depending on activity specifically cleansing the wound makes it worse. 10/05/2017 -- the patient was seen by Dr. Lucky Cowboy last week and noninvasive studies showed a normal right ABI with brisk [name], [name]. ([id number]) triphasic waveforms consistent with no arterial insufficiency including normal digital pressures. The duplex showed a patent distal right SFA stent and the proximal SFA was also normal. He was pleased with her test and thought she should have enough of perfusion for normal wound healing. He would see her back in 6 months time. 12/21/17 on evaluation today patient appears to be doing fairly well in regard to her right lateral ankle wound. Unfortunately the main issue that she is expansion at this point is that she is having some issues with what appears to be some cellulitis in the right anterior shin. She has also been noting a little bit of  uncomfortable feeling especially last night and her ankle area. I'm afraid that she made the developing a little bit of an infection. With that being said I think it is in the early stages. 12/28/17 on evaluation today patient's ankle appears to be doing excellent. She's making good progress at this point the cellulitis seems to have improved after last week's evaluation. Overall she is having no significant discomfort which is excellent news. She does have an appointment with  Dr. dew on March 29, 2018 for reevaluation in regard to the stent he placed. She seems to have excellent blood flow in the right lower extremity. 01/19/12 on evaluation today patient's wound appears to be doing very well. In fact she does not appear to require debridement at this point, there's no evidence of infection, and overall from the standpoint of the wound she seems to be doing very well. With that being said I believe that it may be time to switch to different dressing away from the [facility] Dressing she tells me she does have a lot going on her friend actually passed away yesterday and she's also having a lot of issues with her husband this obviously is weighing heavy on her as far as your thoughts and concerns today. 01/25/18 on evaluation today patient appears to be doing fairly well in regard to her right lateral malleolus. She has been tolerating the dressing changes without complication. Overall I feel like this is definitely showing signs of improvement as far as how the overall appearance of the wound is there's also evidence of epithelium start to migrate over the granulation tissue. In general I think that she is progressing nicely as far as the wound is concerned. The only concern she really has is whether or not we can switch to every other week visits in order to avoid having as many appointments as her daughters have a difficult time getting her to her appointments as well as the patient's husband to  his he is not doing very well at this point. 02/22/18 on evaluation today patient's right lateral malleolus ulcer appears to be doing great. She has been tolerating the dressing changes without complication. Overall you making excellent progress at this time. Patient is having no significant discomfort. 03/15/18 on evaluation today patient appears to be doing much more poorly in regard to her right lateral ankle ulcer at this point. Unfortunately since have last seen her her husband has passed just a few days ago is obviously weighed heavily on her her daughter also had surgery well she is with her today as usual. There does not appear to be any evidence of infection she does seem to have significant contusion/deep tissue injury to the right lateral malleolus which was not noted previous when I saw her last. It's hard to tell of exactly when this injury occurred although during the time she was spending the night in the hospital this may have been most likely. 03/22/18 on evaluation today patient appears to actually be doing very well in regard to her ulcer. She did unfortunately have a setback which was noted last week however the good news is we seem to be getting back on track and in fact the wound in the core did still have some necrotic tissue which will be addressed at this point today but in general I'm seeing signs that things are on the up and up. She is glad to hear this obviously she's been somewhat concerned that due to the how her wound digressed more recently. 03/29/18 on evaluation today patient appears to be doing fairly well in regard to her right lower extremity lateral malleolus ulcer. She unfortunately does have a new area of pressure injury over the inferior portion where the wound has opened up a little bit larger secondary to the pressure she seems to be getting. She does tell me sometimes when she sleeps at night that it actually hurts and does seem to be pushing on the area  little bit more unfortunately. There does not appear to be any evidence of infection which is good news. She has been tolerating the dressing changes without complication. She also did have some bruising in the left second and third toes due to the fact that she may have bump this or injured it although she has neuropathy so she does not feel she did move recently that may have been where this came from. Nonetheless there does not appear to be any evidence of infection at this time. 04/12/18 on evaluation today patient's wound on the right lateral ankle actually appears to be doing a little bit better with a lot of necrotic docking tissue centrally loosening up in clearing away. However she does have the beginnings of a deep tissue injury on the left lateral malleolus likely due to the fact we've been trying offload the right as much as we have. I think she may benefit from an assistive soft device to help with offloading and it looks like they're looking at one of the doughnut conditions that wraps around the lower leg to offload which I think will definitely do a good job. With that being said I think we definitely need to address this issue on the left before it becomes a wound. Patient is not having significant pain. [name], [name] ([id number]) 04/19/18 on evaluation today patient appears to be doing excellent in regard to the progress she's made with her right lateral ankle ulcer. The left ankle region which did show evidence of a deep tissue injury seems to be resolving there's little fluid noted underneath and a blister there's nothing open at this point in time overall I feel like this is progressing nicely which is good news. She does not seem to be having significant discomfort at this point which is also good news. 04/25/18-She is here in follow up evaluation for bilateral lateral malleolar ulcers. The right lateral malleolus ulcer with pale subcutaneous tissue exposure, central area of  ulcer with tendon/periosteum exposed. The left lateral malleolus ulcer now with central area of nonviable tissue, otherwise deep tissue injury. She is wearing compression wraps to the left lower extremity, she will place the right lower extremity compression wraps on when she gets home. She will be out of town over the weekend and return next week and follow-up appointment. She completed her doxycycline this morning 05/03/18 on evaluation today patient appears to be doing very well in regard to her right lateral ankle ulcer in general. At least she's showing some signs of improvement in this regard. Unfortunately she has some additional injury to the left lateral malleolus region which appears to be new likely even over the past several days. Again this determination is based on the overall appearance. With that being said the patient is obviously frustrated about this currently. 05/10/18-She is here in follow-up evaluation for bilateral lateral malleolar ulcers. She states she has purchased offloading shoes/boots and they will arrive tomorrow. She was asked to bring them in the office at next week's appointment so her provider is aware of product being utilized. She continues to sleep on right or left side, she has been encouraged to sleep on her back. The right lateral malleolus ulcer is precariously close to peri-osteum; will order xray. The left lateral malleolus ulcer is improved. Will switch back to santyl; she will follow up next week. 05/17/18 on evaluation today patient actually appears to be doing very well in regard to her malleolus her ulcers compared to last time I saw  them. She does not seem to have as much in the way of contusion at this point which is great news. With that being said she does continue to have discomfort and I do believe that she is still continuing to benefit from the offloading/pressure reducing boots that were recommended. I think this is the key to trying to get this  to heal up completely. 05/24/18 on evaluation today patient actually appears to be doing worse at this point in time unfortunately compared to her last week's evaluation. She is having really no increased pain which is good news unfortunately she does have more maceration in your theme and noted surrounding the right lateral ankle the left lateral ankle is not really is erythematous I do not see signs of the overt cellulitis on that side. Unfortunately the wounds do not seem to have shown any signs of improvement since the last evaluation. She also has significant swelling especially on the right compared to previous some of this may be due to infection however also think that she may be served better while she has these wounds by compression wrapping versus continuing to use the Juxta-Lite for the time being. Especially with the amount of drainage that she is experiencing at this point. No fevers, chills, nausea, or vomiting noted at this time. 05/31/18 on evaluation today patient appears to actually be doing better in regard to her right lateral lower extremity ulcer specifically on the malleolus region. She has been tolerating the antibiotic without complication. With that being said she still continues to have issues but a little bit of redness although nothing like she what she was experiencing previous. She still continues to pressure to her ankle area she did get the problem on offloading boots unfortunately she will not wear them she states there too uncomfortable and she can't get in and out of the bed. Nonetheless at this point her wounds seem to be continually getting worse which is not what we want I'm getting somewhat concerned about her progress and how things are going to proceed if we do not intervene in some way shape or form. I therefore had a very lengthy conversation today about offloading yet again and even made a specific suggestion for switching her to a memory foam mattress and  even gave the information for a specific one that they could look at getting if it was something that they were interested in considering. She does not want to be considered for a hospital bed air mattress although honestly insurance would not cover it that she does not have any wounds on her trunk. 06/14/18 on evaluation today both wounds over the bilateral lateral malleolus her ulcers appear to be doing better there's no evidence of pressure injury at this point. She did get the foam mattress for her bed and this does seem to have been extremely beneficial for her in my pinion. Her daughter states that she is having difficulty getting out of bed because of how soft it is. The patient also relates this to be. Nonetheless I do feel like she's actually doing better. Unfortunately right after and around the time she was getting the mattress she also sustained a fall when she got up to go pick up the phone and ended up injuring her right elbow she has 18 sutures in place. We are not caring for this currently although home health is going to be taking the sutures out shortly. Nonetheless this may be something that we need to evaluate going forward.  It depends on how well it has or has not healed in the end. She also recently saw an orthopedic specialist for an injection in the right shoulder just before her fall unfortunately the fall seems to have worsened her pain. 06/21/18 on evaluation today patient appears to be doing about the same in regard to her lateral malleolus ulcers. Both appear to be just a little bit deeper but again we are clinging away the necrotic and dead tissue which I think is why this is progressing towards a deeper realm as opposed to improving from my measurement standpoint in that regard. Nonetheless she has been tolerating the dressing changes she absolutely hates the memory foam mattress topper that was obtained for her nonetheless [name], [name] ([id number]) I do believe  this is still doing excellent as far as taking care of excess pressure in regard to the lateral malleolus regions. She in fact has no pressure injury that I see whereas in weeks past it was week by week I was constantly seeing new pressure injuries. Overall I think it has been very beneficial for her. 07/03/18; patient arrives in my clinic today. She has deep punched out areas over her bilateral lateral malleoli. The area on the right has some more depth. We spent a lot of time today talking about pressure relief for these areas. This started when her daughter asked for a prescription for a memory foam mattress. I have never written a prescription for a mattress and I don't think insurances would pay for that on an ordinary bed. In any case he came up that she has foam boots that she refuses to wear. I would suggest going to these before any other offloading issues when she is in bed. They say she is meticulous about offloading this the rest of the day 07/10/18- She is seen in follow-up evaluation for bilateral, lateral malleolus ulcers. There is no improvement in the ulcers. She has purchased and is sleeping on a memory foam mattress/overlay, she has been using the offloading boots nightly over the past week. She has a follow up appointment with vascular medicine at the end of October, in my opinion this follow up should be expedited given her deterioration and suboptimal TBI results. We will order plain film xray of the left ankle as deeper structures are palpable; would consider having MRI, regardless of xray report(s). The ulcers will be treated with iodoflex/iodosorb, she is unable to safely change the dressings daily with santyl. 07/19/18 on evaluation today patient appears to be doing in general visually well in regard to her bilateral lateral malleolus ulcers. She has been tolerating the dressing changes without complication which is good news. With that being said we did have an x-ray  performed on 07/12/18 which revealed a slight loosen see in the lateral portion of the distal left fibula which may represent artifact but underline lytic destruction or osteomyelitis could not be excluded. MRI was recommended. With that being said we can see about getting the patient scheduled for an MRI to further evaluate this area. In fact we have that scheduled currently for August 20 19,019. 07/26/18 on evaluation today patient's wound on the right lateral ankle actually appears to be doing fairly well at this point in my pinion. She has made some good progress currently. With that being said unfortunately in regard to the left lateral ankle ulcer this seems to be a little bit more problematic at this time. In fact as I further evaluated the situation she actually  had bone exposed which is the first time that's been the case in the bone appear to be necrotic. Currently I did review patient's note from Dr. Bunnie Domino office with [facility]. He stated that ABI was 1.26 on the right and 0.95 on the left with good waveforms. Her perfusion is stable not reduced from previous studies and her digital waveforms were pretty good particularly on the right. His conclusion upon review of the note was that there was not much she could do to improve her perfusion and he felt she was adequate for wound healing. His suggestion was that she continued to see [place] and consider a synthetic skin graft if there was no underlying infection. He plans to see her back in six months or as needed. 08/01/18 on evaluation today patient appears to be doing better in regard to her right lateral ankle ulcer. Her left lateral ankle ulcer is about the same she still has bone involvement in evidence of necrosis. There does not appear to be evidence of infection at this time On the right lateral lower extremity. I have started her on the Augmentin she picked this up and started this yesterday. This is to get her  through until she sees infectious disease which is scheduled for 08/12/18. 08/06/18 on evaluation today patient appears to be doing rather well considering my discussion with patient's daughter at the end of last week. The area which was marked where she had erythema seems to be improved and this is good news. With that being said overall the patient seems to be making good improvement when it comes to the overall appearance of the right lateral ankle ulcer although this has been slow she at least is coming around in this regard. Unfortunately in regard to the left lateral ankle ulcer this is osteomyelitis based on the pathology report as well is bone culture. Nonetheless we are still waiting CT scan. Unfortunately the MRI we originally ordered cannot be performed as the patient is a pacemaker which I had overlooked. Nonetheless we are working on the CT scan approval and scheduling as of now. She did go to the hospital over the weekend and was placed on IV Cefzo for a couple of days. Fortunately this seems to have improved the erythema quite significantly which is good news. There does not appear to be any evidence of worsening infection at this time. She did have some bleeding after the last debridement therefore I did not perform any sharp debridement in regard to left lateral ankle at this point. Patient has been approved for a snap vac for the right lateral ankle. 08/14/18; the patient with wounds over her bilateral lateral malleoli. The area on the right actually looks quite good. Been using a snap back on this area. Healthy granulation and appears to be filling in. Unfortunately the area on the left is really problematic. 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 Also noted to have cellulitis. She saw Dr. Novella Olive of infectious disease today and was put on linezolid. We are able to verify this with her pharmacy. She is  completed the Augmentin that she was already on. We've been using Iodoflex to this [name], [name] ([id number]) area 08/23/18 on evaluation today patient's wounds both actually appear to be doing better compared to my prior evaluations. Fortunately she showing signs of good improvement in regard to the overall wound status especially where were using  the snap vac on the right. In regard to left lateral malleolus the wound bed actually appears to be much cleaner than previously noted. I do not feel any phone directly probed during evaluation today and though there is tendon noted this does not appear to be necrotic it's actually fairly good as far as the overall appearance of the tendon is concerned. In general the wound bed actually appears to be doing significantly better than it was previous. Patient is currently in the care of [name] and I did review that note today. He actually has her on two weeks of linezolid and then following the patient will be on 1-2 months of Keflex. That is the plan currently. She has been on antibiotics therapy as prescribed by myself initially starting on July 30, 2018 and has been on that continuously up to this point. Patient History Information obtained from Patient. Family History Cancer - Father,Siblings, Heart Disease - Siblings, No family history of Diabetes, Hereditary Spherocytosis, Hypertension, Kidney Disease, Lung Disease, Seizures, Stroke, Thyroid Problems, Tuberculosis. Social History Never smoker, Marital Status - Married, Alcohol Use - Never, Drug Use - No History, Caffeine Use - Rarely. Review of Systems (ROS) Constitutional Symptoms (General Health) Denies complaints or symptoms of Fever, Chills. Respiratory The patient has no complaints or symptoms. Cardiovascular The patient has no complaints or symptoms. Psychiatric The patient has no complaints or symptoms. Objective Constitutional Well-nourished and well-hydrated in no acute  distress. Vitals Time Taken: 11:00 AM, Height: 65 in, Weight: 154.3 lbs, BMI: 25.7, Temperature: 98.4 F, Pulse: 89 bpm, Respiratory Rate: 18 breaths/min, Blood Pressure: 131/63 mmHg. Respiratory normal breathing without difficulty. clear to auscultation bilaterally. Cardiovascular regular rate and rhythm with normal S1, S2. Psychiatric Mickelson, KENSLIE ABBRUZZESE ([id number]) this patient is able to make decisions and demonstrates good insight into disease process. Alert and Oriented x 3. pleasant and cooperative. General Notes: At this time patient's wound bed actually again at both locations show signs of improvement. In fact I think we may be very close to being able to utilize a snap vac on the left lateral malleolus as well in fact I probably would've considered doing that today if it had not been for the fact that the patient has not yet started on the linezolid that her infectious disease doctor, [name], is recommending for her at this point. She was just able to get that field yesterday. Nonetheless when she's been on this for a significantly appropriate amount of time I think we can switch over to a snap vac in regard to left lateral ankle as well. Integumentary (Hair, Skin) Wound #1 status is Open. Original cause of wound was Gradually Appeared. The wound is located on the Right,Lateral Malleolus. The wound measures 1.2cm length x 1.4cm width x 0.3cm depth; 1.319cm^2 area and 0.396cm^3 volume. There is Fat Layer (Subcutaneous Tissue) Exposed exposed. There is no tunneling or undermining noted. There is a large amount of sanguinous drainage noted. Foul odor after cleansing was noted. The wound margin is distinct with the outline attached to the wound base. There is large (67-100%) red granulation within the wound bed. There is no necrotic tissue within the wound bed. The periwound skin appearance exhibited: Maceration, Rubor. The periwound skin appearance did not exhibit:  Callus, Crepitus, Excoriation, Induration, Rash, Scarring, Dry/Scaly, Atrophie Blanche, Cyanosis, Ecchymosis, Hemosiderin Staining, Mottled, Pallor, Erythema. Periwound temperature was noted as No Abnormality. The periwound has tenderness on palpation. Wound #2 status is Open. Original cause of wound was Gradually Appeared.  The wound is located on the Left,Lateral Malleolus. The wound measures 1.8cm length x 1.5cm width x 1.1cm depth; 2.121cm^2 area and 2.333cm^3 volume. There is Fat Layer (Subcutaneous Tissue) Exposed exposed. There is no tunneling or undermining noted. There is a large amount of purulent drainage noted. Foul odor after cleansing was noted. The wound margin is flat and intact. There is small (1-33%) pink granulation within the wound bed. There is a large (67-100%) amount of necrotic tissue within the wound bed including Eschar and Adherent Slough. The periwound skin appearance exhibited: Maceration. The periwound skin appearance did not exhibit: Callus, Crepitus, Excoriation, Induration, Rash, Scarring, Dry/Scaly, Atrophie Blanche, Cyanosis, Ecchymosis, Hemosiderin Staining, Mottled, Pallor, Rubor, Erythema. Periwound temperature was noted as No Abnormality. The periwound has tenderness on palpation. 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 There was a Excisional Skin/Subcutaneous Tissue Debridement with a total area of 1.68 Mcmurtrey, [name]. ([id number]) sq cm performed by STONE III,  Dot Splinter E., PA-C. With the following instrument(s): Curette to remove Viable and Non-Viable tissue/material. Material removed includes Subcutaneous Tissue and Slough and after achieving pain control using Lidocaine 4% Topical Solution. No specimens were taken. A time out was conducted at 12:06, prior to the start of the procedure. A Minimum amount of bleeding was controlled with Pressure. The procedure was tolerated well with a pain level of 0 throughout and a pain level of 0 following the procedure. Post Debridement Measurements: 1.2cm length x 1.4cm width x 0.4cm depth; 0.528cm^3 volume. Post debridement Stage noted as Category/Stage IV. Character of Wound/Ulcer Post Debridement is improved. Severity of Tissue Post Debridement is: Fat layer exposed. Post procedure Diagnosis Wound #1: Same as Pre-Procedure Plan Wound Cleansing: Wound #1 Right,Lateral Malleolus: Clean wound with Normal Saline. Cleanse wound with mild soap and water May Shower, gently pat wound dry prior to applying new dressing. Wound #2 Left,Lateral Malleolus: Clean wound with Normal Saline. Cleanse wound with mild soap and water May Shower, gently pat wound dry prior to applying new dressing. Anesthetic (add to Medication List): Wound #1 Right,Lateral Malleolus: Topical Lidocaine 4% cream applied to wound bed prior to debridement (In Clinic Only). Wound #2 Left,Lateral Malleolus: Topical Lidocaine 4% cream applied to wound bed prior to debridement (In Clinic Only). Primary Wound Dressing: Wound #2 Left,Lateral Malleolus: Silver Alginate - over silver collagen Silver Collagen - packed into tunnel with silver alginate over Secondary Dressing: Wound #2 Left,Lateral Malleolus: Boardered Foam Dressing Dressing Change Frequency: Wound #1 Right,Lateral Malleolus: Change dressing every week - HHRN may remove SNAP vac and use iodoflex and bordered foam dressing if SNAP vac fills up or becomes soiled Wound #2 Left,Lateral  Malleolus: Change Dressing Monday, Wednesday, Friday Follow-up Appointments: Wound #1 Right,Lateral Malleolus: Return Appointment in 1 week. Wound #2 Left,Lateral Malleolus: Return Appointment in 1 week. Edema Control: Wound #1 Right,Lateral Malleolus: Patient to wear own Velcro compression garment. Elevate legs to the level of the heart and pump ankles as often as possible Wound #2 Left,Lateral Malleolus: Patient to wear own Velcro compression garment. Elevate legs to the level of the heart and pump ankles  as often as possible Off-Loading: Wound #1 Right,Lateral Malleolus: Mattress Turn and reposition every 2 hours [name], [name] ([id number]) Other: - do not put pressure on your right ankle Wound #2 Left,Lateral Malleolus: Mattress Turn and reposition every 2 hours Other: - do not put pressure on your right ankle Additional Orders / Instructions: Wound #1 Right,Lateral Malleolus: Increase protein intake. Wound #2 Left,Lateral Malleolus: Increase protein intake. Home Health: Wound #1 Right,Lateral Malleolus: [place] Visits - [place] Nurse may visit PRN to address patient s wound care needs. FACE TO FACE ENCOUNTER: MEDICARE and MEDICAID PATIENTS: I certify that this patient is under my care and that I had a face-to-face encounter that meets the physician face-to-face encounter requirements with this patient on this date. The encounter with the patient was in whole or in part for the following MEDICAL CONDITION: (primary reason for [place]) MEDICAL NECESSITY: I certify, that based on my findings, NURSING services are a medically necessary home health service. HOME BOUND STATUS: I certify that my clinical findings support that this patient is homebound (i.e., Due to illness or injury, pt requires aid of supportive devices such as crutches, cane, wheelchairs, walkers, the use of special transportation or the assistance of another person to  leave their place of residence. There is a normal inability to leave the home and doing so requires considerable and taxing effort. Other absences are for medical reasons / religious services and are infrequent or of short duration when for other reasons). If current dressing causes regression in wound condition, may D/C ordered dressing product/s and apply Normal Saline Moist Dressing daily until next [place] / Other MD appointment. [place] of regression in wound condition at [phone]. Please direct any NON-WOUND related issues/requests for orders to patient's Primary Care Physician Wound #2 Left,Lateral Malleolus: [place] Visits - [place] Nurse may visit PRN to address patient s wound care needs. FACE TO FACE ENCOUNTER: MEDICARE and MEDICAID PATIENTS: I certify that this patient is under my care and that I had a face-to-face encounter that meets the physician face-to-face encounter requirements with this patient on this date. The encounter with the patient was in whole or in part for the following MEDICAL CONDITION: (primary reason for [place]) MEDICAL NECESSITY: I certify, that based on my findings, NURSING services are a medically necessary home health service. HOME BOUND STATUS: I certify that my clinical findings support that this patient is homebound (i.e., Due to illness or injury, pt requires aid of supportive devices such as crutches, cane, wheelchairs, walkers, the use of special transportation or the assistance of another person to leave their place of residence. There is a normal inability to leave the home and doing so requires considerable and taxing effort. Other absences are for medical reasons / religious services and are infrequent or of short duration when for other reasons). If current dressing causes regression in wound condition, may D/C ordered dressing product/s and apply Normal Saline Moist Dressing  daily until next [place] / Other MD appointment. [place] of regression in wound condition at [phone]. Please direct any NON-WOUND related issues/requests for orders to patient's Primary Care Physician I'm gonna suggest currently that we continue with the above wound care measures for the next week. When she's on the linezolid for at least a week I think we can see about performing a snap vac for the left lateral ankle as well is see if we can  get this to improve. The patient and her daughter are in agreement with the plan. We will subtly see were things stand at follow-up. Please see above for specific wound care orders. We will see patient for re-evaluation in 1 week(s) here in the clinic. If anything worsens or changes patient will contact our office for additional recommendations. Electronic Signature(s) Signed: 08/26/2018 8:08:51 AM By: Sherene Sires, [name] ([id number]) Entered By: Worthy Keeler on 08/23/2018 14:27:22 [name] ([id number]) -------------------------------------------------------------------------------- ROS/PFSH Details Patient Name: [name] Date of Service: 08/23/2018 11:00 AM Medical Record Number: [id number] Patient Account Number: [id number] Date of Birth/Sex: Mar 24, 1923 (84 y.o. F) Treating RN: Montey Hora Primary Care Provider: Grayland Ormond Other Clinician: Referring Provider: Grayland Ormond Treating Provider/Extender: Melburn Hake, Emali Heyward Weeks in Treatment: 64 Information Obtained From Patient Wound History Do you currently have one or more open woundso Yes How many open wounds do you currently haveo 1 Approximately how long have you had your woundso 2 yrs How have you been treating your wound(s) until nowo mupirocin, soaking in epsom salt Has your wound(s) ever healed and then re-openedo No Have you had any lab work done in the past montho No Have you tested positive for an  antibiotic resistant organism (MRSA, VRE)o No Have you tested positive for osteomyelitis (bone infection)o No Have you had any tests for circulation on your legso Yes Who ordered the testo Dr. Lucky Cowboy Where was the test doneo [facility] Constitutional Symptoms (General Health) Complaints and Symptoms: Negative for: Fever; Chills Eyes Medical History: Positive for: Cataracts - surgery Respiratory Complaints and Symptoms: No Complaints or Symptoms Cardiovascular Complaints and Symptoms: No Complaints or Symptoms Medical History: Positive for: Congestive Heart Failure; Hypertension Musculoskeletal Medical History: Positive for: Osteoarthritis Neurologic Medical History: Positive for: Neuropathy Oncologic TAWNEE, CLEGG ([id number]) Medical History: Negative for: Received Chemotherapy; Received Radiation Psychiatric Complaints and Symptoms: No Complaints or Symptoms HBO Extended History Items Eyes: Cataracts Immunizations Pneumococcal Vaccine: Received Pneumococcal Vaccination: Yes Implantable Devices Family and Social History Cancer: Yes - Father,Siblings; Diabetes: No; Heart Disease: Yes - Siblings; Hereditary Spherocytosis: No; Hypertension: No; Kidney Disease: No; Lung Disease: No; Seizures: No; Stroke: No; Thyroid Problems: No; Tuberculosis: No; Never smoker; Marital Status - Married; Alcohol Use: Never; Drug Use: No History; Caffeine Use: Rarely; Financial Concerns: No; Food, Clothing or Shelter Needs: No; Support System Lacking: No; Transportation Concerns: No; Advanced Directives: No; Patient does not want information on Advanced Directives; Do not resuscitate: No; Living Will: Yes (Not Provided); Medical Power of Attorney: No Physician Affirmation I have reviewed and agree with the above information. Electronic Signature(s) Signed: 08/23/2018 5:18:43 PM By: Montey Hora Signed: 08/26/2018 8:08:51 AM By: Worthy Keeler PA-C Entered By: Worthy Keeler on 08/23/2018  14:24:57 Dede, [name] ([id number]) -------------------------------------------------------------------------------- SuperBill Details Patient Name: [name] Date of Service: 08/23/2018 Medical Record Number: [id number] Patient Account Number: [id number] Date of Birth/Sex: March 18, 1923 (84 y.o. 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, left ankle and foot Facility Procedures CPT4 Code: 69678938 Description: 10175 - DEB  SUBQ TISSUE 20 SQ CM/< ICD-10 Diagnosis Description 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, stage 4 Modifier: Quantity: 1 Physician Procedures CPT4 Code: 1505697 Description: 94801 - WC PHYS LEVEL 3 - EST PT ICD-10 Diagnosis Description 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, stage 4 L89.524 Pressure ulcer of left ankle, stage 4 I70.233 Atherosclerosis of native arteries of right leg with ulceration I70.243  Atherosclerosis of native arteries of left leg with ulceration Modifier: 25 of ankle of ankle Quantity: 1 CPT4 Code: 6553748 Description: 27078 - WC PHYS SUBQ TISS 20 SQ CM ICD-10 Diagnosis Description 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, stage 4 Modifier: Quantity: 1 Electronic Signature(s) Signed: 08/26/2018 8:08:51 AM By: Worthy Keeler PA-C Entered By: Worthy Keeler on 08/23/2018 14:27:55

## 2018-08-27 ENCOUNTER — Ambulatory Visit: Payer: Medicare Other | Admitting: Internal Medicine

## 2018-08-27 NOTE — Progress Notes (Signed)
DAMISHA, WOLFF ([id number]) Visit Report for 08/23/2018 Arrival Information Details Patient Name: [name], [name] Date of Service: 08/23/2018 11:00 AM Medical Record Number: [id number] Patient Account Number: [id number] Date of Birth/Sex: February 22, 1923 (84 y.o. F) Treating RN: Secundino Ginger Primary Care Kalib Bhagat: Grayland Ormond Other Clinician: Referring Keoni Risinger: Grayland Ormond Treating Luverta Korte/Extender: Melburn Hake, HOYT Weeks in Treatment: 72 Visit Information History Since Last Visit Added or deleted any medications: No Patient Arrived: Cane Any new allergies or adverse reactions: No Arrival Time: 11:05 Had a fall or experienced change in No Accompanied By: daughter activities of daily living that may affect Transfer Assistance: None risk of falls: Patient Requires Transmission-Based No Signs or symptoms of abuse/neglect since last visito No Precautions: Hospitalized since last visit: No Patient Has Alerts: Yes Implantable device outside of the clinic excluding No Patient Alerts: ABI AVVS 03/29/18 L cellular tissue based products placed in the center 1.05 since last visit: R 1.03, TBI L .56, Has Dressing in Place as Prescribed: Yes R .85 ABI AVVS 07/23/18 Pain Present Now: No L .95 R 1.26 Electronic Signature(s) Signed: 08/23/2018 4:01:10 PM By: Secundino Ginger Entered By: Secundino Ginger on 08/23/2018 11:06:23 [name] ([id number]) -------------------------------------------------------------------------------- Encounter Discharge Information Details Patient Name: [name] Date of Service: 08/23/2018 11:00 AM Medical Record Number: [id number] Patient Account Number: [id number] Date of Birth/Sex: May 25, 1923 (84 y.o. F) Treating RN: Cornell Barman Primary Care Maretta Overdorf: Grayland Ormond Other Clinician: Referring Carlynn Leduc: Grayland Ormond Treating Romina Divirgilio/Extender: Melburn Hake, HOYT Weeks in Treatment: 16 Encounter Discharge Information Items Discharge Condition:  Stable Ambulatory Status: Cane Discharge Destination: Home Transportation: Private Auto Accompanied By: daughter Schedule Follow-up Appointment: Yes Clinical Summary of Care: Post Procedure Vitals: Temperature (F): 98.4 Pulse (bpm): 89 Respiratory Rate (breaths/min): 16 Blood Pressure (mmHg): 131/63 Electronic Signature(s) Signed: 08/23/2018 5:43:30 PM By: Gretta Cool, BSN, RN, CWS, Kim RN, BSN Entered By: Gretta Cool, BSN, RN, CWS, Kim on 08/23/2018 12:24:30 Seaman, [name] ([id number]) -------------------------------------------------------------------------------- Lower Extremity Assessment Details Patient Name: [name] Date of Service: 08/23/2018 11:00 AM Medical Record Number: [id number] Patient Account Number: [id number] Date of Birth/Sex: 09/30/1923 (84 y.o. F) Treating RN: Secundino Ginger Primary Care Letty Salvi: Grayland Ormond Other Clinician: Referring Armistead Sult: Grayland Ormond Treating Alzina Golda/Extender: Melburn Hake, HOYT Weeks in Treatment: 30 Edema Assessment Assessed: [Left: No] [Right: No] [Left: Edema] [Right: :] Calf Left: Right: Point of Measurement: 29 cm From Medial Instep 33 cm 29 cm Ankle Left: Right: Point of Measurement: 12 cm From Medial Instep 19.5 cm 23.5 cm Vascular Assessment Claudication: Claudication Assessment [Left:None] [Right:None] Pulses: Dorsalis Pedis Palpable: [Left:Yes] [Right:Yes] Posterior Tibial Extremity colors, hair growth, and conditions: Extremity Color: [Left:Hyperpigmented] [Right:Hyperpigmented] Hair Growth on Extremity: [Left:No] [Right:No] Temperature of Extremity: [Left:Warm] [Right:Warm] Capillary Refill: [Left:< 3 seconds] [Right:< 3 seconds] Toe Nail Assessment Left: Right: Thick: Yes Yes Discolored: No No Deformed: No No Improper Length and Hygiene: No No Electronic Signature(s) Signed: 08/23/2018 4:01:10 PM By: Secundino Ginger Entered By: Secundino Ginger on 08/23/2018 11:18:14 Botero, [name]  ([id number]) -------------------------------------------------------------------------------- Multi Wound Chart Details Patient Name: [name] Date of Service: 08/23/2018 11:00 AM Medical Record Number: [id number] Patient Account Number: [id number] Date of Birth/Sex: [birth year] (84 y.o. 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 N/A Comorbid History: Cataracts, Congestive Heart Cataracts, Congestive Heart N/A Failure, Hypertension, Failure, Hypertension, Osteoarthritis, Neuropathy Osteoarthritis, Neuropathy Date Acquired: 08/11/2015 04/12/2018 N/A Weeks of Treatment: 45 18 N/A Wound Status: Open Open N/A Measurements L x W x D 1.2x1.4x0.3 1.8x1.5x1.1 N/A (cm) Area (cm) : 1.319 2.121 N/A Volume (cm) : 0.396 2.333 N/A % Reduction in Area: 13.90% -170.20% N/A % Reduction in Volume: 13.70% -2853.20% N/A Classification: Category/Stage IV Category/Stage IV N/A Exudate Amount: Large Large N/A Exudate Type: Sanguinous Purulent N/A Exudate Color: red yellow, brown, green N/A Foul Odor After Cleansing: Yes Yes N/A Odor Anticipated Due to No No N/A Product Use: Wound Margin: Distinct, outline attached Flat and Intact N/A Granulation Amount: Large (67-100%) Small (1-33%) N/A Granulation Quality: Red Pink N/A Necrotic Amount: None Present (0%) Large (67-100%) N/A Necrotic Tissue: N/A  Eschar, Adherent Slough N/A Exposed Structures: N/A [name] ([id number]) Fat Layer (Subcutaneous Fat Layer (Subcutaneous Tissue) Exposed: Yes Tissue) Exposed: Yes Fascia: No Fascia: No Tendon: No Tendon: No Muscle: No Muscle: No Joint: No Joint: No Bone: No Bone: No Epithelialization: None None N/A Periwound Skin Texture: Excoriation: No Excoriation: No N/A Induration: No Induration: No Callus: No Callus: No Crepitus: No Crepitus: No Rash: No Rash: No Scarring: No Scarring: No Periwound Skin Moisture: Maceration: Yes Maceration: Yes N/A Dry/Scaly: No Dry/Scaly: No Periwound Skin Color: Rubor: Yes Atrophie Blanche: No N/A Atrophie Blanche: No Cyanosis: No Cyanosis: No Ecchymosis: No Ecchymosis: No Erythema: No Erythema: No Hemosiderin Staining: No Hemosiderin Staining: No Mottled: No Mottled: No Pallor: No Pallor: No Rubor: No Temperature: No Abnormality No Abnormality N/A Tenderness on Palpation: Yes Yes N/A Wound Preparation: Ulcer Cleansing: Ulcer Cleansing: N/A Rinsed/Irrigated with Saline Rinsed/Irrigated with Saline Topical Anesthetic Applied: Topical Anesthetic Applied: Other: lidocaine 4% Other: lidocaine 4% Treatment Notes Electronic Signature(s) Signed: 08/23/2018 5:18:43 PM By: Montey Hora Entered By: Montey Hora on 08/23/2018 11:58:05 Straley, [name] ([id number]) -------------------------------------------------------------------------------- [facility] Details Patient Name: [name] Date of Service: 08/23/2018 11:00 AM Medical Record Number: [id number] Patient Account Number: [id number] Date of Birth/Sex: 31-Aug-1923 (84 y.o. F) Treating RN: Montey Hora Primary Care Gearld Kerstein: Grayland Ormond Other Clinician: Referring Adalin Vanderploeg: Grayland Ormond Treating Irvin Lizama/Extender: Melburn Hake, HOYT Weeks in Treatment: 33 Active Inactive ` Abuse / Safety / Falls / Self Care Management Nursing  Diagnoses: Potential for falls Goals: Patient will not experience any injury related to falls Date Initiated: 08/10/2017 Target Resolution Date: 11/10/2017 Goal Status: Active Interventions: Assess Activities of Daily Living upon admission and as needed Assess fall risk on admission and as needed Assess: immobility, friction, shearing, incontinence upon admission and as needed Notes: ` Nutrition Nursing Diagnoses: Imbalanced nutrition Potential for alteratiion in Nutrition/Potential for imbalanced nutrition Goals: Patient/caregiver agrees to and verbalizes understanding of need to use nutritional supplements and/or vitamins as prescribed Date Initiated: 08/10/2017 Target Resolution Date: 12/08/2017 Goal Status: Active Interventions: Assess patient nutrition upon admission and as needed per policy Notes: ` Orientation to the Wound Care Program Nursing Diagnoses: Knowledge deficit related to the wound healing center program Goals: Patient/caregiver will verbalize understanding of the [place] Program Date Initiated: 08/10/2017 Target Resolution Date: 09/08/2017 RAYETTA, VEITH ([id number]) Goal Status: Active Interventions: Provide education on orientation to the wound center Notes: ` Pain, Acute or Chronic Nursing Diagnoses: Pain, acute or chronic: actual or potential Potential alteration in comfort, pain  Goals: Patient/caregiver will verbalize adequate pain control between visits Date Initiated: 08/10/2017 Target Resolution Date: 12/08/2017 Goal Status: Active Interventions: Complete pain assessment as per visit requirements Notes: ` Wound/Skin Impairment Nursing Diagnoses: Impaired tissue integrity Knowledge deficit related to ulceration/compromised skin integrity Goals: Ulcer/skin breakdown will have a volume reduction of 80% by week 12 Date Initiated: 08/10/2017 Target Resolution Date: 12/01/2017 Goal Status: Active Interventions: Assess  patient/caregiver ability to perform ulcer/skin care regimen upon admission and as needed Notes: Electronic Signature(s) Signed: 08/23/2018 5:18:43 PM By: Montey Hora Entered By: Montey Hora on 08/23/2018 11:57:54 Starn, [name] ([id number]) -------------------------------------------------------------------------------- Pain Assessment Details Patient Name: [name] Date of Service: 08/23/2018 11:00 AM Medical Record Number: [id number] Patient Account Number: [id number] Date of Birth/Sex: 1923/11/15 (84 y.o. F) Treating RN: Secundino Ginger Primary Care Demaryius Imran: Grayland Ormond Other Clinician: Referring Zarai Orsborn: Grayland Ormond Treating Teofilo Lupinacci/Extender: Melburn Hake, HOYT Weeks in Treatment: 88 Active Problems Location of Pain Severity and Description of Pain Patient Has Paino No Site Locations Pain Management and Medication Current Pain Management: Electronic Signature(s) Signed: 08/23/2018 4:01:10 PM By: Secundino Ginger Entered By: Secundino Ginger on 08/23/2018 11:06:34 Fraleigh, [name] ([id number]) -------------------------------------------------------------------------------- Patient/Caregiver Education Details Patient Name: [name] Date of Service: 08/23/2018 11:00 AM Medical Record Number: [id number] Patient Account Number: [id number] Date of Birth/Gender: 10-16-1923 (84 y.o. F) Treating RN: Cornell Barman Primary Care Physician: Grayland Ormond Other Clinician: Referring Physician: Grayland Ormond Treating Physician/Extender: Sharalyn Ink in Treatment: 47 Education Assessment Education Provided To: Caregiver Education Topics Provided Wound/Skin Impairment: Handouts: Caring for Your Ulcer, Other: continue wound care as prescribed Methods: Demonstration Responses: State content correctly Electronic Signature(s) Signed: 08/23/2018 5:43:30 PM By: Gretta Cool, BSN, RN, CWS, Kim RN, BSN Entered By: Gretta Cool, BSN, RN, CWS, Kim on 08/23/2018 12:25:00 Flahive,  [name] ([id number]) -------------------------------------------------------------------------------- Wound Assessment Details Patient Name: [name] Date of Service: 08/23/2018 11:00 AM Medical Record Number: [id number] Patient Account Number: [id number] Date of Birth/Sex: Jul 13, 1923 (84 y.o. F) Treating RN: Secundino Ginger Primary Care Meridee Branum: Grayland Ormond Other Clinician: Referring Leira Regino: Grayland Ormond Treating Guliana Weyandt/Extender: STONE III, HOYT Weeks in Treatment: 44 Wound Status Wound Number: 1 Primary Pressure Ulcer Etiology: Wound Location: Right Malleolus - Lateral Secondary Arterial Insufficiency Ulcer Wounding Event: Gradually Appeared Etiology: Date Acquired: 08/11/2015 Wound Status: Open Weeks Of Treatment: 54 Comorbid Cataracts, Congestive Heart Failure, Clustered Wound: No History: Hypertension, Osteoarthritis, Neuropathy Photos Photo Uploaded By: Secundino Ginger on 08/23/2018 11:34:11 Wound Measurements Length: (cm) 1.2 % Reductio Width: (cm) 1.4 % Reductio Depth: (cm) 0.3 Epithelial Area: (cm) 1.319 Tunneling Volume: (cm) 0.396 Undermini n in Area: 13.9% n in Volume: 13.7% ization: None : No ng: No Wound Description Classification: Category/Stage IV Foul Odor Wound Margin: Distinct, outline attached Due to Pro Exudate Amount: Large Slough/Fib Exudate Type: Sanguinous Exudate Color: red After Cleansing: Yes duct Use: No rino Yes Wound Bed Granulation Amount: Large (67-100%) Exposed Structure Granulation Quality: Red Fascia Exposed: No Necrotic Amount: None Present (0%) Fat Layer (Subcutaneous Tissue) Exposed: Yes Tendon Exposed: No Muscle Exposed: No Joint Exposed: No Bone Exposed: No Periwound Skin Texture Hudler, [name] ([id number]) Texture Color No Abnormalities Noted: No No Abnormalities Noted: No Callus: No Atrophie Blanche: No Crepitus: No Cyanosis: No Excoriation: No Ecchymosis: No Induration: No Erythema:  No Rash: No Hemosiderin Staining: No Scarring: No Mottled: No Pallor: No Moisture Rubor: Yes No Abnormalities Noted: No Dry / Scaly: No Temperature / Pain Maceration: Yes Temperature: No Abnormality Tenderness on Palpation: Yes Wound Preparation Ulcer Cleansing:  Rinsed/Irrigated with Saline Topical Anesthetic Applied: Other: lidocaine 4%, Treatment Notes Wound #1 (Right, Lateral Malleolus) Notes Snap Vac one piece of foam Electronic Signature(s) Signed: 08/23/2018 4:01:10 PM By: Secundino Ginger Entered By: Secundino Ginger on 08/23/2018 11:25:50 Lysne, [name] ([id number]) -------------------------------------------------------------------------------- Wound Assessment Details Patient Name: [name] Date of Service: 08/23/2018 11:00 AM Medical Record Number: [id number] Patient Account Number: [id number] Date of Birth/Sex: May 15, 1923 (84 y.o. F) Treating RN: Secundino Ginger Primary Care Shelley Pooley: Grayland Ormond Other Clinician: Referring Meliah Appleman: Grayland Ormond Treating Jelisha Weed/Extender: STONE III, HOYT Weeks in Treatment: 24 Wound Status Wound Number: 2 Primary Pressure Ulcer Etiology: Wound Location: Left Malleolus - Lateral Secondary Arterial Insufficiency Ulcer Wounding Event: Gradually Appeared Etiology: Date Acquired: 04/12/2018 Wound Status: Open Weeks Of Treatment: 18 Comorbid Cataracts, Congestive Heart Failure, Clustered Wound: No History: Hypertension, Osteoarthritis, Neuropathy Photos Photo Uploaded By: Secundino Ginger on 08/23/2018 11:34:12 Wound Measurements Length: (cm) 1.8 % Reduction Width: (cm) 1.5 % Reduction Depth: (cm) 1.1 Epitheliali Area: (cm) 2.121 Tunneling: Volume: (cm) 2.333 Underminin in Area: -170.2% in Volume: -2853.2% zation: None No g: No Wound Description Classification: Category/Stage IV Foul Odor A Wound Margin: Flat and Intact Due to Prod Exudate Amount: Large Slough/Fibr Exudate Type: Purulent Exudate Color: yellow,  brown, green fter Cleansing: Yes uct Use: No ino Yes Wound Bed Granulation Amount: Small (1-33%) Exposed Structure Granulation Quality: Pink Fascia Exposed: No Necrotic Amount: Large (67-100%) Fat Layer (Subcutaneous Tissue) Exposed: Yes Necrotic Quality: Eschar, Adherent Slough Tendon Exposed: No Muscle Exposed: No Joint Exposed: No Bone Exposed: No Periwound Skin Texture Weller, [name] ([id number]) Texture Color No Abnormalities Noted: No No Abnormalities Noted: No Callus: No Atrophie Blanche: No Crepitus: No Cyanosis: No Excoriation: No Ecchymosis: No Induration: No Erythema: No Rash: No Hemosiderin Staining: No Scarring: No Mottled: No Pallor: No Moisture Rubor: No No Abnormalities Noted: No Dry / Scaly: No Temperature / Pain Maceration: Yes Temperature: No Abnormality Tenderness on Palpation: Yes Wound Preparation Ulcer Cleansing: Rinsed/Irrigated with Saline Topical Anesthetic Applied: Other: lidocaine 4%, Treatment Notes Wound #2 (Left, Lateral Malleolus) 1. Cleansed with: Clean wound with Normal Saline 2. Anesthetic Topical Lidocaine 4% cream to wound bed prior to debridement 4. Dressing Applied: Prisma Ag Other dressing (specify in notes) Notes prisma ag, silvercell, BFD Electronic Signature(s) Signed: 08/23/2018 4:01:10 PM By: Secundino Ginger Entered By: Secundino Ginger on 08/23/2018 11:14:43 Mathisen, [name] ([id number]) -------------------------------------------------------------------------------- [facility] Details Patient Name: [name] Date of Service: 08/23/2018 11:00 AM Medical Record Number: [id number] Patient Account Number: [id number] Date of Birth/Sex: [birth year] (84 y.o. F) Treating RN: Secundino Ginger Primary Care Raistlin Gum: Grayland Ormond Other Clinician: Referring Johnavon Mcclafferty: Grayland Ormond Treating Hayden Mabin/Extender: Melburn Hake, HOYT Weeks in Treatment: 54 Vital Signs Time Taken: 11:00 Temperature (F): 98.4 Height (in):  65 Pulse (bpm): 89 Weight (lbs): 154.3 Respiratory Rate (breaths/min): 18 Body Mass Index (BMI): 25.7 Blood Pressure (mmHg): 131/63 Reference Range: 80 - 120 mg / dl Electronic Signature(s) Signed: 08/23/2018 4:01:10 PM By: Secundino Ginger Entered BySecundino Ginger on 08/23/2018 11:07:09

## 2018-08-30 ENCOUNTER — Encounter: Payer: Medicare Other | Admitting: Physician Assistant

## 2018-08-30 DIAGNOSIS — I70243 Atherosclerosis of native arteries of left leg with ulceration of ankle: Secondary | ICD-10-CM | POA: Diagnosis not present

## 2018-09-01 NOTE — Progress Notes (Signed)
REEDA, SOOHOO ([id number]) Visit Report for 08/30/2018 Chief Complaint Document Details Patient Name: [name], [name]. Date of Service: 08/30/2018 11:00 AM Medical Record Number: [id number] Patient Account Number: [id number] Date of Birth/Sex: [birth year] (84 y.o. F) Treating RN: Montey Hora Primary Care Provider: Grayland Ormond Other Clinician: Referring Provider: Grayland Ormond Treating Provider/Extender: Melburn Hake, HOYT Weeks in Treatment: 74 Information Obtained from: Patient Chief Complaint Patient is here for right lateral malleolus and left lateral malleolus ulcer Electronic Signature(s) Signed: 08/30/2018 5:13:23 PM By: Worthy Keeler PA-C Entered By: Worthy Keeler on 08/30/2018 11:15:32 Eastep, [name] ([id number]) -------------------------------------------------------------------------------- HPI Details Patient Name: [name] Date of Service: 08/30/2018 11:00 AM Medical Record Number: [id number] Patient Account Number: [id number] Date of Birth/Sex: 03/27/1923 (84 y.o. 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 Dr. Amalia Hailey the podiatrist saw her on 07/20/2017 and sent her to the wound center for possible hyperbaric oxygen therapy. past medical history of peripheral vascular disease, varicose veins, status post appendectomy, basal cell carcinoma excision from the left leg, cholecystectomy, pacemaker placement, right lower extremity angiography done by Dr. dew in March 2017 with placement of a stent. there is also note of a successful ablation of the right small saphenous  vein done which was reviewed by ultrasound on 10/24/2016. the patient had a right small saphenous vein ablation done on 10/20/2016. The patient has never been a smoker. She has been seen by Dr. Corene Cornea dew the vascular surgeon who most recently saw her on 06/15/2017 for evaluation of ongoing problems with right leg swelling. She had a lower extremity arterial duplex examination done(02/13/17) which showed patent distal right superficial femoral artery stent and above-the-knee popliteal stent without evidence of restenosis. The ABI was more than 1.3 on the right and more than 1.3 on the left. This was consistent with noncompressible arteries due to medial calcification. The right great toe pressure and PPG waveforms are within normal limits and the left great toe pressure and PPG waveforms are decreased. he recommended she continue to wear her compression stockings and continue with elevation. She is scheduled to have a noninvasive arterial study in the near future 08/16/2017 -- had a lower extremity arterial duplex examination done which showed patent distal right superficial femoral artery stent and above-the-knee popliteal stent without evidence of restenosis. The ABI was more than 1.3 on the right and more than 1.3 on the left. This was consistent with noncompressible arteries due to medial calcification. The right great toe pressure and PPG waveforms are within normal limits and the left great toe pressure and PPG waveforms are decreased. the x-ray of the right ankle has not yet been done 08/24/2017 -- had a right ankle x-ray -- IMPRESSION:1. No fracture, bone lesion or evidence of osteomyelitis. 2. Lateral soft tissue swelling with a soft tissue ulcer. she has not yet seen the vascular surgeon for review 08/31/17 on evaluation today patient's wound appears to be showing signs of improvement. She still with her appointment with vascular in order to review her results of her vascular study and  then determine if any intervention would be recommended at that time. No fevers, chills, nausea, or vomiting noted at this time. She has  been tolerating the dressing changes without complication. 09/28/17 on evaluation today patient's wound appears to show signs of good improvement in regard to the granulation tissue which is surfacing. There is still a layer of slough covering the wound and the posterior portion is still significantly deeper than the anterior nonetheless there has been some good sign of things moving towards the better. She is going to go back to Dr. dew for reevaluation to ensure her blood flow is still appropriate. That will be before her next evaluation with [place] next week. No fevers, chills, nausea, or vomiting noted at this time. Patient does have some discomfort rated to be a 3-4/10 depending on activity specifically cleansing the wound makes it worse. 10/05/2017 -- the patient was seen by Dr. Lucky Cowboy last week and noninvasive studies showed a normal right ABI with brisk triphasic waveforms consistent with no arterial insufficiency including normal digital pressures. The duplex showed a patent distal right SFA stent and the proximal SFA was also normal. He was pleased with her test and thought she should have enough of perfusion for normal wound healing. He would see her back in 6 months time. 12/21/17 on evaluation today patient appears to be doing fairly well in regard to her right lateral ankle wound. Unfortunately the main issue that she is expansion at this point is that she is having some issues with what appears to be some cellulitis in the NALEIGHA, [name]. ([id number]) right anterior shin. She has also been noting a little bit of uncomfortable feeling especially last night and her ankle area. I'm afraid that she made the developing a little bit of an infection. With that being said I think it is in the early stages. 12/28/17 on evaluation today patient's ankle appears to  be doing excellent. She's making good progress at this point the cellulitis seems to have improved after last week's evaluation. Overall she is having no significant discomfort which is excellent news. She does have an appointment with Dr. dew on March 29, 2018 for reevaluation in regard to the stent he placed. She seems to have excellent blood flow in the right lower extremity. 01/19/12 on evaluation today patient's wound appears to be doing very well. In fact she does not appear to require debridement at this point, there's no evidence of infection, and overall from the standpoint of the wound she seems to be doing very well. With that being said I believe that it may be time to switch to different dressing away from the [facility] Dressing she tells me she does have a lot going on her friend actually passed away yesterday and she's also having a lot of issues with her husband this obviously is weighing heavy on her as far as your thoughts and concerns today. 01/25/18 on evaluation today patient appears to be doing fairly well in regard to her right lateral malleolus. She has been tolerating the dressing changes without complication. Overall I feel like this is definitely showing signs of improvement as far as how the overall appearance of the wound is there's also evidence of epithelium start to migrate over the granulation tissue. In general I think that she is progressing nicely as far as the wound is concerned. The only concern she really has is whether or not we can switch to every other week visits in order to avoid having as many appointments as her daughters have a difficult time getting her to her appointments as well as the patient's husband to his he is  not doing very well at this point. 02/22/18 on evaluation today patient's right lateral malleolus ulcer appears to be doing great. She has been tolerating the dressing changes without complication. Overall you making excellent progress  at this time. Patient is having no significant discomfort. 03/15/18 on evaluation today patient appears to be doing much more poorly in regard to her right lateral ankle ulcer at this point. Unfortunately since have last seen her her husband has passed just a few days ago is obviously weighed heavily on her her daughter also had surgery well she is with her today as usual. There does not appear to be any evidence of infection she does seem to have significant contusion/deep tissue injury to the right lateral malleolus which was not noted previous when I saw her last. It's hard to tell of exactly when this injury occurred although during the time she was spending the night in the hospital this may have been most likely. 03/22/18 on evaluation today patient appears to actually be doing very well in regard to her ulcer. She did unfortunately have a setback which was noted last week however the good news is we seem to be getting back on track and in fact the wound in the core did still have some necrotic tissue which will be addressed at this point today but in general I'm seeing signs that things are on the up and up. She is glad to hear this obviously she's been somewhat concerned that due to the how her wound digressed more recently. 03/29/18 on evaluation today patient appears to be doing fairly well in regard to her right lower extremity lateral malleolus ulcer. She unfortunately does have a new area of pressure injury over the inferior portion where the wound has opened up a little bit larger secondary to the pressure she seems to be getting. She does tell me sometimes when she sleeps at night that it actually hurts and does seem to be pushing on the area little bit more unfortunately. There does not appear to be any evidence of infection which is good news. She has been tolerating the dressing changes without complication. She also did have some bruising in the left second and third toes due to the  fact that she may have bump this or injured it although she has neuropathy so she does not feel she did move recently that may have been where this came from. Nonetheless there does not appear to be any evidence of infection at this time. 04/12/18 on evaluation today patient's wound on the right lateral ankle actually appears to be doing a little bit better with a lot of necrotic docking tissue centrally loosening up in clearing away. However she does have the beginnings of a deep tissue injury on the left lateral malleolus likely due to the fact we've been trying offload the right as much as we have. I think she may benefit from an assistive soft device to help with offloading and it looks like they're looking at one of the doughnut conditions that wraps around the lower leg to offload which I think will definitely do a good job. With that being said I think we definitely need to address this issue on the left before it becomes a wound. Patient is not having significant pain. 04/19/18 on evaluation today patient appears to be doing excellent in regard to the progress she's made with her right lateral ankle ulcer. The left ankle region which did show evidence of a deep tissue injury seems  to be resolving there's little fluid noted underneath and a blister there's nothing open at this point in time overall I feel like this is progressing nicely which is good news. She does not seem to be having significant discomfort at this point which is also good news. 04/25/18-She is here in follow up evaluation for bilateral lateral malleolar ulcers. The right lateral malleolus ulcer with pale subcutaneous tissue exposure, central area of ulcer with tendon/periosteum exposed. The left lateral malleolus ulcer now with Wisdom, [name] ([id number]) central area of nonviable tissue, otherwise deep tissue injury. She is wearing compression wraps to the left lower extremity, she will place the right lower extremity  compression wraps on when she gets home. She will be out of town over the weekend and return next week and follow-up appointment. She completed her doxycycline this morning 05/03/18 on evaluation today patient appears to be doing very well in regard to her right lateral ankle ulcer in general. At least she's showing some signs of improvement in this regard. Unfortunately she has some additional injury to the left lateral malleolus region which appears to be new likely even over the past several days. Again this determination is based on the overall appearance. With that being said the patient is obviously frustrated about this currently. 05/10/18-She is here in follow-up evaluation for bilateral lateral malleolar ulcers. She states she has purchased offloading shoes/boots and they will arrive tomorrow. She was asked to bring them in the office at next week's appointment so her provider is aware of product being utilized. She continues to sleep on right or left side, she has been encouraged to sleep on her back. The right lateral malleolus ulcer is precariously close to peri-osteum; will order xray. The left lateral malleolus ulcer is improved. Will switch back to santyl; she will follow up next week. 05/17/18 on evaluation today patient actually appears to be doing very well in regard to her malleolus her ulcers compared to last time I saw them. She does not seem to have as much in the way of contusion at this point which is great news. With that being said she does continue to have discomfort and I do believe that she is still continuing to benefit from the offloading/pressure reducing boots that were recommended. I think this is the key to trying to get this to heal up completely. 05/24/18 on evaluation today patient actually appears to be doing worse at this point in time unfortunately compared to her last week's evaluation. She is having really no increased pain which is good news unfortunately she  does have more maceration in your theme and noted surrounding the right lateral ankle the left lateral ankle is not really is erythematous I do not see signs of the overt cellulitis on that side. Unfortunately the wounds do not seem to have shown any signs of improvement since the last evaluation. She also has significant swelling especially on the right compared to previous some of this may be due to infection however also think that she may be served better while she has these wounds by compression wrapping versus continuing to use the Juxta-Lite for the time being. Especially with the amount of drainage that she is experiencing at this point. No fevers, chills, nausea, or vomiting noted at this time. 05/31/18 on evaluation today patient appears to actually be doing better in regard to her right lateral lower extremity ulcer specifically on the malleolus region. She has been tolerating the antibiotic without complication. With that being  said she still continues to have issues but a little bit of redness although nothing like she what she was experiencing previous. She still continues to pressure to her ankle area she did get the problem on offloading boots unfortunately she will not wear them she states there too uncomfortable and she can't get in and out of the bed. Nonetheless at this point her wounds seem to be continually getting worse which is not what we want I'm getting somewhat concerned about her progress and how things are going to proceed if we do not intervene in some way shape or form. I therefore had a very lengthy conversation today about offloading yet again and even made a specific suggestion for switching her to a memory foam mattress and even gave the information for a specific one that they could look at getting if it was something that they were interested in considering. She does not want to be considered for a hospital bed air mattress although honestly insurance would not  cover it that she does not have any wounds on her trunk. 06/14/18 on evaluation today both wounds over the bilateral lateral malleolus her ulcers appear to be doing better there's no evidence of pressure injury at this point. She did get the foam mattress for her bed and this does seem to have been extremely beneficial for her in my pinion. Her daughter states that she is having difficulty getting out of bed because of how soft it is. The patient also relates this to be. Nonetheless I do feel like she's actually doing better. Unfortunately right after and around the time she was getting the mattress she also sustained a fall when she got up to go pick up the phone and ended up injuring her right elbow she has 18 sutures in place. We are not caring for this currently although home health is going to be taking the sutures out shortly. Nonetheless this may be something that we need to evaluate going forward. It depends on how well it has or has not healed in the end. She also recently saw an orthopedic specialist for an injection in the right shoulder just before her fall unfortunately the fall seems to have worsened her pain. 06/21/18 on evaluation today patient appears to be doing about the same in regard to her lateral malleolus ulcers. Both appear to be just a little bit deeper but again we are clinging away the necrotic and dead tissue which I think is why this is progressing towards a deeper realm as opposed to improving from my measurement standpoint in that regard. Nonetheless she has been tolerating the dressing changes she absolutely hates the memory foam mattress topper that was obtained for her nonetheless I do believe this is still doing excellent as far as taking care of excess pressure in regard to the lateral malleolus regions. She in fact has no pressure injury that I see whereas in weeks past it was week by week I was constantly seeing new pressure injuries. Overall I think it has been  very beneficial for her. 07/03/18; patient arrives in my clinic today. She has deep punched out areas over her bilateral lateral malleoli. The area on the right has some more depth. ALEXANDRE, LIGHTSEY ([id number]) We spent a lot of time today talking about pressure relief for these areas. This started when her daughter asked for a prescription for a memory foam mattress. I have never written a prescription for a mattress and I don't think insurances would pay  for that on an ordinary bed. In any case he came up that she has foam boots that she refuses to wear. I would suggest going to these before any other offloading issues when she is in bed. They say she is meticulous about offloading this the rest of the day 07/10/18- She is seen in follow-up evaluation for bilateral, lateral malleolus ulcers. There is no improvement in the ulcers. She has purchased and is sleeping on a memory foam mattress/overlay, she has been using the offloading boots nightly over the past week. She has a follow up appointment with vascular medicine at the end of October, in my opinion this follow up should be expedited given her deterioration and suboptimal TBI results. We will order plain film xray of the left ankle as deeper structures are palpable; would consider having MRI, regardless of xray report(s). The ulcers will be treated with iodoflex/iodosorb, she is unable to safely change the dressings daily with santyl. 07/19/18 on evaluation today patient appears to be doing in general visually well in regard to her bilateral lateral malleolus ulcers. She has been tolerating the dressing changes without complication which is good news. With that being said we did have an x-ray performed on 07/12/18 which revealed a slight loosen see in the lateral portion of the distal left fibula which may represent artifact but underline lytic destruction or osteomyelitis could not be excluded. MRI was recommended. With that being said we can  see about getting the patient scheduled for an MRI to further evaluate this area. In fact we have that scheduled currently for August 20 19,019. 07/26/18 on evaluation today patient's wound on the right lateral ankle actually appears to be doing fairly well at this point in my pinion. She has made some good progress currently. With that being said unfortunately in regard to the left lateral ankle ulcer this seems to be a little bit more problematic at this time. In fact as I further evaluated the situation she actually had bone exposed which is the first time that's been the case in the bone appear to be necrotic. Currently I did review patient's note from Dr. Bunnie Domino office with [facility]. He stated that ABI was 1.26 on the right and 0.95 on the left with good waveforms. Her perfusion is stable not reduced from previous studies and her digital waveforms were pretty good particularly on the right. His conclusion upon review of the note was that there was not much she could do to improve her perfusion and he felt she was adequate for wound healing. His suggestion was that she continued to see [place] and consider a synthetic skin graft if there was no underlying infection. He plans to see her back in six months or as needed. 08/01/18 on evaluation today patient appears to be doing better in regard to her right lateral ankle ulcer. Her left lateral ankle ulcer is about the same she still has bone involvement in evidence of necrosis. There does not appear to be evidence of infection at this time On the right lateral lower extremity. I have started her on the Augmentin she picked this up and started this yesterday. This is to get her through until she sees infectious disease which is scheduled for 08/12/18. 08/06/18 on evaluation today patient appears to be doing rather well considering my discussion with patient's daughter at the end of last week. The area which was marked where she had  erythema seems to be improved and this is good  news. With that being said overall the patient seems to be making good improvement when it comes to the overall appearance of the right lateral ankle ulcer although this has been slow she at least is coming around in this regard. Unfortunately in regard to the left lateral ankle ulcer this is osteomyelitis based on the pathology report as well is bone culture. Nonetheless we are still waiting CT scan. Unfortunately the MRI we originally ordered cannot be performed as the patient is a pacemaker which I had overlooked. Nonetheless we are working on the CT scan approval and scheduling as of now. She did go to the hospital over the weekend and was placed on IV Cefzo for a couple of days. Fortunately this seems to have improved the erythema quite significantly which is good news. There does not appear to be any evidence of worsening infection at this time. She did have some bleeding after the last debridement therefore I did not perform any sharp debridement in regard to left lateral ankle at this point. Patient has been approved for a snap vac for the right lateral ankle. 08/14/18; the patient with wounds over her bilateral lateral malleoli. The area on the right actually looks quite good. Been using a snap back on this area. Healthy granulation and appears to be filling in. Unfortunately the area on the left is really problematic. 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 Also noted to have cellulitis. She saw Dr. Novella Olive of infectious disease today and was put on linezolid. We are able to verify this with her pharmacy. She is completed the Augmentin that she was already on. We've been using Iodoflex to this area 08/23/18 on evaluation today patient's wounds both actually appear to be doing better compared to my prior evaluations. Fortunately she showing signs of good  improvement in regard to the overall wound status especially where were using the snap vac on the right. In regard to left lateral malleolus the wound bed actually appears to be much cleaner than previously noted. I do not feel any phone directly probed during evaluation today and though there is tendon noted this does not appear Shrout, [name] ([id number]) to be necrotic it's actually fairly good as far as the overall appearance of the tendon is concerned. In general the wound bed actually appears to be doing significantly better than it was previous. Patient is currently in the care of [name] and I did review that note today. He actually has her on two weeks of linezolid and then following the patient will be on 1-2 months of Keflex. That is the plan currently. She has been on antibiotics therapy as prescribed by myself initially starting on July 30, 2018 and has been on that continuously up to this point. 08/30/18 on evaluation today patient actually appears to be doing much better in regard to her right lateral malleolus ulcer. She has been tolerating the dressing changes specifically the snap vac without complication although she did have some issues with the seal currently. Apparently there was some trouble with getting it to maintain over the past week past Sunday. Nonetheless overall the wound appears better in regard to the right lateral malleolus region. In regard to left lateral malleolus this actually show some signs of additional granulation although there still tendon noted in the base of the wound this appears to be healthy not necrotic in any way whatsoever. We are considering potentially  using a snap vac for the left lateral malleolus as well the product wrap from [employer], [place], was present in the clinic today we're going to see this patient I did have her come in with me after obtaining consent from the patient and her daughter in order to look at the wound and see if there's  any recommendation one way or another as to whether or not they felt the snapback could be beneficial for the left lateral malleolus region. But the conclusion was that it might be but that this is definitely a little bit deeper wound than what traditionally would be utilized for a snap vac. Electronic Signature(s) Signed: 08/30/2018 5:13:23 PM By: Worthy Keeler PA-C Entered By: Worthy Keeler on 08/30/2018 12:52:44 Oliff, [name] ([id number]) -------------------------------------------------------------------------------- Physical Exam Details Patient Name: [name] Date of Service: 08/30/2018 11:00 AM Medical Record Number: [id number] Patient Account Number: [id number] Date of Birth/Sex: 1923/08/06 (84 y.o. F) Treating RN: Montey Hora Primary Care Provider: Grayland Ormond Other Clinician: Referring Provider: Grayland Ormond Treating Provider/Extender: STONE III, HOYT Weeks in Treatment: 12 Constitutional Well-nourished and well-hydrated in no acute distress. Respiratory normal breathing without difficulty. clear to auscultation bilaterally. Cardiovascular regular rate and rhythm with normal S1, S2. Psychiatric this patient is able to make decisions and demonstrates good insight into disease process. Alert and Oriented x 3. pleasant and cooperative. Notes Neither patient's wounds actually required sharp debridement today as things appear to be doing fairly well. For sure there's no evidence of infection which is good news as well. In general I'm very pleased with the overall progress she seems to be making. Electronic Signature(s) Signed: 08/30/2018 5:13:23 PM By: Worthy Keeler PA-C Entered By: Worthy Keeler on 08/30/2018 12:53:21 Schnurr, [name] ([id number]) -------------------------------------------------------------------------------- Physician Orders Details Patient Name: [name] Date of Service: 08/30/2018 11:00 AM Medical Record  Number: [id number] Patient Account Number: [id number] Date of Birth/Sex: 1923/09/09 (84 y.o. 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 o Cleanse wound with mild soap and water o May Shower, gently pat wound dry prior to applying new dressing. Wound #2 Left,Lateral Malleolus o Clean wound with Normal Saline. o Cleanse wound with mild soap and water o May Shower, gently pat wound dry prior to applying new dressing. Anesthetic (add to Medication List) Wound #1 Right,Lateral Malleolus o Topical Lidocaine 4% cream applied to wound bed prior to debridement (In Clinic Only). Wound #2 Left,Lateral Malleolus o Topical Lidocaine 4% cream applied to wound bed prior to debridement (In Clinic Only). Primary Wound Dressing Wound #2 Left,Lateral Malleolus o Mepitel One Contact layer - over tendon o Silver Collagen - packed into tunnel Wound #1 Right,Lateral Malleolus o Silver Collagen Dressing Change Frequency Wound #1 Right,Lateral Malleolus o Change dressing every week - HHRN may remove SNAP VAC and use silver alginate and bordered foam dressing if SNAP VAC fills up or becomes soiled o Change dressing every week Hippler, Hulan Amato  G. ([id number]) Wound #2 Left,Lateral Malleolus o Change  dressing every week Follow-up Appointments Wound #1 Right,Lateral Malleolus o Return Appointment in 1 week. Wound #2 Left,Lateral Malleolus o Return Appointment in 1 week. Edema Control Wound #1 Right,Lateral Malleolus o Patient to wear own Velcro compression garment. o Elevate legs to the level of the heart and pump ankles as often as possible Wound #2 Left,Lateral Malleolus o Patient to wear own Velcro compression garment. o Elevate legs to the level of the heart and pump ankles as often as possible Off-Loading Wound #1 Right,Lateral Malleolus o Mattress o Turn and reposition every 2 hours o Other: - do not put pressure on your right ankle Wound #2 Left,Lateral Malleolus o Mattress o Turn and reposition every 2 hours o Other: - do not put pressure on your right ankle Additional Orders / Instructions Wound #1 Right,Lateral Malleolus o Increase protein intake. Wound #2 Left,Lateral Malleolus o Increase protein intake. Home Health Wound #1 [place] Visits - [place] Please visit patient once this week and check both SNAP VACs for integrity and notify [place] if either needs to be removed and another dressing placed o Home Health Nurse may visit PRN to address patientos wound care needs. o FACE TO FACE ENCOUNTER: MEDICARE and MEDICAID PATIENTS: I certify that this patient is under my care and that I had a face-to-face encounter that meets the physician face-to-face encounter requirements with this patient on this date. The encounter with the patient was in whole or in part for the following MEDICAL CONDITION: (primary reason for [place]) MEDICAL NECESSITY: I certify, that based on my findings, NURSING services are a medically necessary home health service. HOME BOUND STATUS: I certify that my clinical findings support that this patient is homebound (i.e., Due to illness or  injury, pt requires aid of supportive devices such as crutches, cane, wheelchairs, walkers, the use of special transportation or the assistance of another person to leave their place of residence. There is a normal inability to leave the home and doing so requires considerable and taxing effort. Other absences are for medical reasons / religious services and are infrequent or of short duration when for other reasons). BLENDA, WISECUP ([id number]) o If current dressing causes regression in wound condition, may D/C ordered dressing product/s and apply Normal Saline Moist Dressing daily until next [place] / Other MD appointment. [name] of regression in wound condition at [phone]. o Please direct any NON-WOUND related issues/requests for orders to patient's Primary Care Physician Wound #2 [place] Visits - [place] Please visit patient once this week and check both SNAP VACs for integrity and notify [place] if either needs to be removed and another dressing placed o Home Health Nurse may visit PRN to address patientos wound care needs. o FACE TO FACE ENCOUNTER: MEDICARE and MEDICAID PATIENTS: I certify that this patient is under my care and that I had a face-to-face encounter that meets the physician face-to-face encounter requirements with this patient on this date. The encounter with the patient was in whole or in part for the following MEDICAL CONDITION: (primary reason for [place]) MEDICAL NECESSITY: I certify, that based on my findings, NURSING services are a medically necessary home health service. HOME BOUND STATUS: I certify that my clinical findings support that this patient is homebound (i.e., Due to illness or injury, pt requires aid of supportive  devices such as crutches, cane, wheelchairs, walkers, the use of special transportation or the assistance of another person  to leave their place of residence. There is a normal inability to leave the home and doing so requires considerable and taxing effort. Other absences are for medical reasons / religious services and are infrequent or of short duration when for other reasons). o If current dressing causes regression in wound condition, may D/C ordered dressing product/s and apply Normal Saline Moist Dressing daily until next [place] / Other MD appointment. [place] of regression in wound condition at [phone]. o Please direct any NON-WOUND related issues/requests for orders to patient's Primary Care Physician Negative Pressure Wound Therapy Wound #1 Right,Lateral Malleolus o Other: - SNAP VAC Wound #2 Left,Lateral Malleolus o Other: - SNAP VAC Electronic Signature(s) Signed: 08/30/2018 5:07:50 PM By: Montey Hora Signed: 08/30/2018 5:13:23 PM By: Worthy Keeler PA-C Entered By: Montey Hora on 08/30/2018 12:01:12 [place], [name] ([id number]) -------------------------------------------------------------------------------- Problem List Details Patient Name: [name] Date of Service: 08/30/2018 11:00 AM Medical Record Number: [id number] Patient Account Number: [id number] Date of Birth/Sex: 1923-02-21 (84 y.o. 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, stage 4 05/10/2018 No Yes L89.524 Pressure ulcer of left ankle, stage 4 04/25/2018 No Yes I70.233 Atherosclerosis of native arteries of right leg with ulceration of 08/10/2017 No Yes ankle I70.243 Atherosclerosis of native arteries of left leg with ulceration of 07/10/2018 No Yes ankle I89.0 Lymphedema, not elsewhere classified  08/10/2017 No Yes B35.4 Tinea corporis 09/28/2017 No Yes 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 Dr. Amalia Hailey the podiatrist saw her on 07/20/2017 and sent her to the wound center for possible hyperbaric oxygen therapy. past medical history of peripheral vascular disease, varicose veins, status post appendectomy, basal cell carcinoma excision from the left leg, cholecystectomy, pacemaker placement, right lower extremity angiography done by Dr. dew in March 2017 with placement of a stent. there is also note of a successful ablation of the right small saphenous vein done which was reviewed by ultrasound on 10/24/2016. the patient had a right small saphenous vein ablation done on 10/20/2016. The patient has never been a smoker. She has been seen by Dr. Corene Cornea dew the vascular surgeon who most recently saw her on 06/15/2017 for evaluation  of ongoing problems with right leg swelling. She had a lower extremity arterial duplex examination done(02/13/17) which showed patent distal right superficial femoral artery stent and above-the-knee popliteal stent without evidence of restenosis. The  ABI was more than 1.3 on the right and more than 1.3 on the left. This was consistent with noncompressible arteries due to medial calcification. The right great toe pressure and PPG waveforms are within normal limits and the left great toe pressure and PPG waveforms are decreased. he recommended she continue to wear her compression stockings and continue with elevation. She is scheduled to have a noninvasive arterial study in the near future 08/16/2017 -- had a lower extremity arterial duplex examination done which showed patent distal right superficial femoral artery stent and above-the-knee popliteal stent without evidence of restenosis. The ABI was more than 1.3 on the right and more than 1.3 on the left. This was consistent with noncompressible arteries due to medial calcification. The right great toe pressure and PPG waveforms are within normal limits and the left great toe pressure and PPG waveforms are decreased. the x-ray of the right ankle has not yet been done 08/24/2017 -- had a right ankle x-ray -- IMPRESSION:1. No fracture, bone lesion or evidence of osteomyelitis. 2. Lateral soft tissue swelling with a soft tissue ulcer. she has not yet seen the vascular surgeon for review 08/31/17 on evaluation today patient's wound appears to be showing signs of improvement. She still with her appointment with vascular in order to review her results of her vascular study and then determine if any intervention would be recommended at that time. No fevers, chills, nausea, or vomiting noted at this time. She has been tolerating the dressing changes without complication. 09/28/17 on evaluation today patient's wound appears to show signs of good improvement  in regard to the granulation tissue which is surfacing. There is still a layer of slough covering the wound and the posterior portion is still significantly deeper than the anterior nonetheless there has been some good sign of things moving towards the better. She is going to go back to Dr. dew for reevaluation to ensure her blood flow is still appropriate. That will be before her next evaluation with [place] next week. No fevers, chills, nausea, or vomiting noted at this time. Patient does have some discomfort rated to be a 3-4/10 depending on activity specifically cleansing the wound makes it worse. 10/05/2017 -- the patient was seen by Dr. Lucky Cowboy last week and noninvasive studies showed a normal right ABI with brisk BAILLIE, MOHAMMAD. ([id number]) triphasic waveforms consistent with no arterial insufficiency including normal digital pressures. The duplex showed a patent distal right SFA stent and the proximal SFA was also normal. He was pleased with her test and thought she should have enough of perfusion for normal wound healing. He would see her back in 6 months time. 12/21/17 on evaluation today patient appears to be doing fairly well in regard to her right lateral ankle wound. Unfortunately the main issue that she is expansion at this point is that she is having some issues with what appears to be some cellulitis in the right anterior shin. She has also been noting a little bit of uncomfortable feeling especially last night and her ankle area. I'm afraid that she made the developing a little bit of an infection. With that being said I think it is in the early stages. 12/28/17 on evaluation today patient's ankle appears to be doing excellent. She's making good progress at this point the cellulitis seems to have improved after last week's evaluation. Overall she is having no significant discomfort which is excellent news. She does have an appointment with Dr. dew on March 29, 2018 for reevaluation  in  regard to the stent he placed. She seems to have excellent blood flow in the right lower extremity. 01/19/12 on evaluation today patient's wound appears to be doing very well. In fact she does not appear to require debridement at this point, there's no evidence of infection, and overall from the standpoint of the wound she seems to be doing very well. With that being said I believe that it may be time to switch to different dressing away from the [facility] Dressing she tells me she does have a lot going on her friend actually passed away yesterday and she's also having a lot of issues with her husband this obviously is weighing heavy on her as far as your thoughts and concerns today. 01/25/18 on evaluation today patient appears to be doing fairly well in regard to her right lateral malleolus. She has been tolerating the dressing changes without complication. Overall I feel like this is definitely showing signs of improvement as far as how the overall appearance of the wound is there's also evidence of epithelium start to migrate over the granulation tissue. In general I think that she is progressing nicely as far as the wound is concerned. The only concern she really has is whether or not we can switch to every other week visits in order to avoid having as many appointments as her daughters have a difficult time getting her to her appointments as well as the patient's husband to his he is not doing very well at this point. 02/22/18 on evaluation today patient's right lateral malleolus ulcer appears to be doing great. She has been tolerating the dressing changes without complication. Overall you making excellent progress at this time. Patient is having no significant discomfort. 03/15/18 on evaluation today patient appears to be doing much more poorly in regard to her right lateral ankle ulcer at this point. Unfortunately since have last seen her her husband has passed just a few days ago is  obviously weighed heavily on her her daughter also had surgery well she is with her today as usual. There does not appear to be any evidence of infection she does seem to have significant contusion/deep tissue injury to the right lateral malleolus which was not noted previous when I saw her last. It's hard to tell of exactly when this injury occurred although during the time she was spending the night in the hospital this may have been most likely. 03/22/18 on evaluation today patient appears to actually be doing very well in regard to her ulcer. She did unfortunately have a setback which was noted last week however the good news is we seem to be getting back on track and in fact the wound in the core did still have some necrotic tissue which will be addressed at this point today but in general I'm seeing signs that things are on the up and up. She is glad to hear this obviously she's been somewhat concerned that due to the how her wound digressed more recently. 03/29/18 on evaluation today patient appears to be doing fairly well in regard to her right lower extremity lateral malleolus ulcer. She unfortunately does have a new area of pressure injury over the inferior portion where the wound has opened up a little bit larger secondary to the pressure she seems to be getting. She does tell me sometimes when she sleeps at night that it actually hurts and does seem to be pushing on the area little bit more unfortunately. There does not appear  to be any evidence of infection which is good news. She has been tolerating the dressing changes without complication. She also did have some bruising in the left second and third toes due to the fact that she may have bump this or injured it although she has neuropathy so she does not feel she did move recently that may have been where this came from. Nonetheless there does not appear to be any evidence of infection at this time. 04/12/18 on evaluation today  patient's wound on the right lateral ankle actually appears to be doing a little bit better with a lot of necrotic docking tissue centrally loosening up in clearing away. However she does have the beginnings of a deep tissue injury on the left lateral malleolus likely due to the fact we've been trying offload the right as much as we have. I think she may benefit from an assistive soft device to help with offloading and it looks like they're looking at one of the doughnut conditions that wraps around the lower leg to offload which I think will definitely do a good job. With that being said I think we definitely need to address this issue on the left before it becomes a wound. Patient is not having significant pain. MARYNA, YEAGLE ([id number]) 04/19/18 on evaluation today patient appears to be doing excellent in regard to the progress she's made with her right lateral ankle ulcer. The left ankle region which did show evidence of a deep tissue injury seems to be resolving there's little fluid noted underneath and a blister there's nothing open at this point in time overall I feel like this is progressing nicely which is good news. She does not seem to be having significant discomfort at this point which is also good news. 04/25/18-She is here in follow up evaluation for bilateral lateral malleolar ulcers. The right lateral malleolus ulcer with pale subcutaneous tissue exposure, central area of ulcer with tendon/periosteum exposed. The left lateral malleolus ulcer now with central area of nonviable tissue, otherwise deep tissue injury. She is wearing compression wraps to the left lower extremity, she will place the right lower extremity compression wraps on when she gets home. She will be out of town over the weekend and return next week and follow-up appointment. She completed her doxycycline this morning 05/03/18 on evaluation today patient appears to be doing very well in regard to her right lateral  ankle ulcer in general. At least she's showing some signs of improvement in this regard. Unfortunately she has some additional injury to the left lateral malleolus region which appears to be new likely even over the past several days. Again this determination is based on the overall appearance. With that being said the patient is obviously frustrated about this currently. 05/10/18-She is here in follow-up evaluation for bilateral lateral malleolar ulcers. She states she has purchased offloading shoes/boots and they will arrive tomorrow. She was asked to bring them in the office at next week's appointment so her provider is aware of product being utilized. She continues to sleep on right or left side, she has been encouraged to sleep on her back. The right lateral malleolus ulcer is precariously close to peri-osteum; will order xray. The left lateral malleolus ulcer is improved. Will switch back to santyl; she will follow up next week. 05/17/18 on evaluation today patient actually appears to be doing very well in regard to her malleolus her ulcers compared to last time I saw them. She does not seem to have  as much in the way of contusion at this point which is great news. With that being said she does continue to have discomfort and I do believe that she is still continuing to benefit from the offloading/pressure reducing boots that were recommended. I think this is the key to trying to get this to heal up completely. 05/24/18 on evaluation today patient actually appears to be doing worse at this point in time unfortunately compared to her last week's evaluation. She is having really no increased pain which is good news unfortunately she does have more maceration in your theme and noted surrounding the right lateral ankle the left lateral ankle is not really is erythematous I do not see signs of the overt cellulitis on that side. Unfortunately the wounds do not seem to have shown any signs of improvement  since the last evaluation. She also has significant swelling especially on the right compared to previous some of this may be due to infection however also think that she may be served better while she has these wounds by compression wrapping versus continuing to use the Juxta-Lite for the time being. Especially with the amount of drainage that she is experiencing at this point. No fevers, chills, nausea, or vomiting noted at this time. 05/31/18 on evaluation today patient appears to actually be doing better in regard to her right lateral lower extremity ulcer specifically on the malleolus region. She has been tolerating the antibiotic without complication. With that being said she still continues to have issues but a little bit of redness although nothing like she what she was experiencing previous. She still continues to pressure to her ankle area she did get the problem on offloading boots unfortunately she will not wear them she states there too uncomfortable and she can't get in and out of the bed. Nonetheless at this point her wounds seem to be continually getting worse which is not what we want I'm getting somewhat concerned about her progress and how things are going to proceed if we do not intervene in some way shape or form. I therefore had a very lengthy conversation today about offloading yet again and even made a specific suggestion for switching her to a memory foam mattress and even gave the information for a specific one that they could look at getting if it was something that they were interested in considering. She does not want to be considered for a hospital bed air mattress although honestly insurance would not cover it that she does not have any wounds on her trunk. 06/14/18 on evaluation today both wounds over the bilateral lateral malleolus her ulcers appear to be doing better there's no evidence of pressure injury at this point. She did get the foam mattress for her bed and  this does seem to have been extremely beneficial for her in my pinion. Her daughter states that she is having difficulty getting out of bed because of how soft it is. The patient also relates this to be. Nonetheless I do feel like she's actually doing better. Unfortunately right after and around the time she was getting the mattress she also sustained a fall when she got up to go pick up the phone and ended up injuring her right elbow she has 18 sut[address] place. We are not caring for this currently although home health is going to be taking the sutures out shortly. Nonetheless this may be something that we need to evaluate going forward. It depends on how well it has  or has not healed in the end. She also recently saw an orthopedic specialist for an injection in the right shoulder just before her fall unfortunately the fall seems to have worsened her pain. 06/21/18 on evaluation today patient appears to be doing about the same in regard to her lateral malleolus ulcers. Both appear to be just a little bit deeper but again we are clinging away the necrotic and dead tissue which I think is why this is progressing towards a deeper realm as opposed to improving from my measurement standpoint in that regard. Nonetheless she has been tolerating the dressing changes she absolutely hates the memory foam mattress topper that was obtained for her nonetheless Jillaine, Waren [name] ([id number]) I do believe this is still doing excellent as far as taking care of excess pressure in regard to the lateral malleolus regions. She in fact has no pressure injury that I see whereas in weeks past it was week by week I was constantly seeing new pressure injuries. Overall I think it has been very beneficial for her. 07/03/18; patient arrives in my clinic today. She has deep punched out areas over her bilateral lateral malleoli. The area on the right has some more depth. We spent a lot of time today talking about pressure  relief for these areas. This started when her daughter asked for a prescription for a memory foam mattress. I have never written a prescription for a mattress and I don't think insurances would pay for that on an ordinary bed. In any case he came up that she has foam boots that she refuses to wear. I would suggest going to these before any other offloading issues when she is in bed. They say she is meticulous about offloading this the rest of the day 07/10/18- She is seen in follow-up evaluation for bilateral, lateral malleolus ulcers. There is no improvement in the ulcers. She has purchased and is sleeping on a memory foam mattress/overlay, she has been using the offloading boots nightly over the past week. She has a follow up appointment with vascular medicine at the end of October, in my opinion this follow up should be expedited given her deterioration and suboptimal TBI results. We will order plain film xray of the left ankle as deeper structures are palpable; would consider having MRI, regardless of xray report(s). The ulcers will be treated with iodoflex/iodosorb, she is unable to safely change the dressings daily with santyl. 07/19/18 on evaluation today patient appears to be doing in general visually well in regard to her bilateral lateral malleolus ulcers. She has been tolerating the dressing changes without complication which is good news. With that being said we did have an x-ray performed on 07/12/18 which revealed a slight loosen see in the lateral portion of the distal left fibula which may represent artifact but underline lytic destruction or osteomyelitis could not be excluded. MRI was recommended. With that being said we can see about getting the patient scheduled for an MRI to further evaluate this area. In fact we have that scheduled currently for August 20 19,019. 07/26/18 on evaluation today patient's wound on the right lateral ankle actually appears to be doing fairly well at this  point in my pinion. She has made some good progress currently. With that being said unfortunately in regard to the left lateral ankle ulcer this seems to be a little bit more problematic at this time. In fact as I further evaluated the situation she actually had bone exposed which is the first  time that's been the case in the bone appear to be necrotic. Currently I did review patient's note from Dr. Bunnie Domino office with [facility]. He stated that ABI was 1.26 on the right and 0.95 on the left with good waveforms. Her perfusion is stable not reduced from previous studies and her digital waveforms were pretty good particularly on the right. His conclusion upon review of the note was that there was not much she could do to improve her perfusion and he felt she was adequate for wound healing. His suggestion was that she continued to see [place] and consider a synthetic skin graft if there was no underlying infection. He plans to see her back in six months or as needed. 08/01/18 on evaluation today patient appears to be doing better in regard to her right lateral ankle ulcer. Her left lateral ankle ulcer is about the same she still has bone involvement in evidence of necrosis. There does not appear to be evidence of infection at this time On the right lateral lower extremity. I have started her on the Augmentin she picked this up and started this yesterday. This is to get her through until she sees infectious disease which is scheduled for 08/12/18. 08/06/18 on evaluation today patient appears to be doing rather well considering my discussion with patient's daughter at the end of last week. The area which was marked where she had erythema seems to be improved and this is good news. With that being said overall the patient seems to be making good improvement when it comes to the overall appearance of the right lateral ankle ulcer although this has been slow she at least is coming around in this  regard. Unfortunately in regard to the left lateral ankle ulcer this is osteomyelitis based on the pathology report as well is bone culture. Nonetheless we are still waiting CT scan. Unfortunately the MRI we originally ordered cannot be performed as the patient is a pacemaker which I had overlooked. Nonetheless we are working on the CT scan approval and scheduling as of now. She did go to the hospital over the weekend and was placed on IV Cefzo for a couple of days. Fortunately this seems to have improved the erythema quite significantly which is good news. There does not appear to be any evidence of worsening infection at this time. She did have some bleeding after the last debridement therefore I did not perform any sharp debridement in regard to left lateral ankle at this point. Patient has been approved for a snap vac for the right lateral ankle. 08/14/18; the patient with wounds over her bilateral lateral malleoli. The area on the right actually looks quite good. Been using a snap back on this area. Healthy granulation and appears to be filling in. Unfortunately the area on the left is really problematic. 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 Also noted to have cellulitis. She saw Dr. Novella Olive of infectious disease today and was put on linezolid. We are able to verify this with her pharmacy. She is completed the Augmentin that she was already on. We've been using Iodoflex to this PHYLLISS, STREGE ([id number]) area 08/23/18 on evaluation today patient's wounds both actually appear to be doing better compared to my prior evaluations. Fortunately she showing signs of good improvement in regard to the overall wound status especially where were using the snap vac on the right. In regard  to left lateral malleolus the wound bed actually appears to be much cleaner than previously noted. I do not feel any phone  directly probed during evaluation today and though there is tendon noted this does not appear to be necrotic it's actually fairly good as far as the overall appearance of the tendon is concerned. In general the wound bed actually appears to be doing significantly better than it was previous. Patient is currently in the care of [name] and I did review that note today. He actually has her on two weeks of linezolid and then following the patient will be on 1-2 months of Keflex. That is the plan currently. She has been on antibiotics therapy as prescribed by myself initially starting on July 30, 2018 and has been on that continuously up to this point. 08/30/18 on evaluation today patient actually appears to be doing much better in regard to her right lateral malleolus ulcer. She has been tolerating the dressing changes specifically the snap vac without complication although she did have some issues with the seal currently. Apparently there was some trouble with getting it to maintain over the past week past Sunday. Nonetheless overall the wound appears better in regard to the right lateral malleolus region. In regard to left lateral malleolus this actually show some signs of additional granulation although there still tendon noted in the base of the wound this appears to be healthy not necrotic in any way whatsoever. We are considering potentially using a snap vac for the left lateral malleolus as well the product wrap from [employer], [place], was present in the clinic today we're going to see this patient I did have her come in with me after obtaining consent from the patient and her daughter in order to look at the wound and see if there's any recommendation one way or another as to whether or not they felt the snapback could be beneficial for the left lateral malleolus region. But the conclusion was that it might be but that this is definitely a little bit deeper wound than what traditionally would be  utilized for a snap vac. Patient History Information obtained from Patient. Family History Cancer - Father,Siblings, Heart Disease - Siblings, No family history of Diabetes, Hereditary Spherocytosis, Hypertension, Kidney Disease, Lung Disease, Seizures, Stroke, Thyroid Problems, Tuberculosis. Social History Never smoker, Marital Status - Married, Alcohol Use - Never, Drug Use - No History, Caffeine Use - Rarely. Review of Systems (ROS) Constitutional Symptoms (General Health) Denies complaints or symptoms of Fever, Chills. Respiratory The patient has no complaints or symptoms. Cardiovascular The patient has no complaints or symptoms. Psychiatric The patient has no complaints or symptoms. Objective Constitutional Well-nourished and well-hydrated in no acute distress. ROZANN, HOLTS ([id number]) Vitals Time Taken: 11:05 AM, Height: 65 in, Weight: 154.3 lbs, BMI: 25.7, Temperature: 98.6 F, Pulse: 74 bpm, Respiratory Rate: 18 breaths/min, Blood Pressure: 117/56 mmHg. Respiratory normal breathing without difficulty. clear to auscultation bilaterally. Cardiovascular regular rate and rhythm with normal S1, S2. Psychiatric this patient is able to make decisions and demonstrates good insight into disease process. Alert and Oriented x 3. pleasant and cooperative. General Notes: Neither patient's wounds actually required sharp debridement today as things appear to be doing fairly well. For sure there's no evidence of infection which is good news as well. In general I'm very pleased with the overall progress she seems to be making. Integumentary (Hair, Skin) Wound #1 status is Open. Original cause of wound was Gradually Appeared. The wound is located  on the Right,Lateral Malleolus. The wound measures 1.5cm length x 1.5cm width x 0.3cm depth; 1.767cm^2 area and 0.53cm^3 volume. There is Fat Layer (Subcutaneous Tissue) Exposed exposed. There is no tunneling or undermining noted. There is  a large amount of sanguinous drainage noted. The wound margin is distinct with the outline attached to the wound base. There is large (67- 100%) red granulation within the wound bed. There is no necrotic tissue within the wound bed. The periwound skin appearance exhibited: Maceration, Rubor. The periwound skin appearance did not exhibit: Callus, Crepitus, Excoriation, Induration, Rash, Scarring, Dry/Scaly, Atrophie Blanche, Cyanosis, Ecchymosis, Hemosiderin Staining, Mottled, Pallor, Erythema. Periwound temperature was noted as No Abnormality. The periwound has tenderness on palpation. Wound #2 status is Open. Original cause of wound was Gradually Appeared. The wound is located on the Left,Lateral Malleolus. The wound measures 1.6cm length x 1.5cm width x 1.8cm depth; 1.885cm^2 area and 3.393cm^3 volume. There is Fat Layer (Subcutaneous Tissue) Exposed exposed. There is no undermining noted, however, there is tunneling at 5:00 with a maximum distance of 1.2cm. There is a large amount of purulent drainage noted. The wound margin is flat and intact. There is small (1-33%) pink granulation within the wound bed. There is a large (67-100%) amount of necrotic tissue within the wound bed including Eschar and Adherent Slough. The periwound skin appearance exhibited: Maceration. The periwound skin appearance did not exhibit: Callus, Crepitus, Excoriation, Induration, Rash, Scarring, Dry/Scaly, Atrophie Blanche, Cyanosis, Ecchymosis, Hemosiderin Staining, Mottled, Pallor, Rubor, Erythema. Periwound temperature was noted as No Abnormality. The periwound has tenderness on palpation. 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 Cleanse wound with mild soap and water May Shower, gently pat wound dry prior to applying new dressing. Wound #2 Left,Lateral Malleolus: Clean wound with Normal Saline. Cleanse wound with mild soap and water May Shower, gently pat wound dry prior to applying new dressing. Anesthetic (add to Medication List): Wound #1 Right,Lateral Malleolus: Topical Lidocaine 4% cream applied to wound bed prior to debridement (In Clinic Only). Wound #2 Left,Lateral Malleolus: Topical Lidocaine 4% cream applied to wound bed prior to debridement (In Clinic Only). Primary Wound Dressing: Wound #2 Left,Lateral Malleolus: Mepitel One Contact layer - over tendon Silver Collagen - packed into tunnel Wound #1 Right,Lateral Malleolus: Silver Collagen Dressing Change Frequency: Wound #1 Right,Lateral Malleolus: Change dressing every week - HHRN may remove SNAP VAC and use silver alginate and bordered foam dressing if SNAP VAC fills up or becomes soiled Change dressing every week Wound #2 Left,Lateral Malleolus: Change dressing every week Follow-up Appointments: Wound #1 Right,Lateral Malleolus: Return Appointment in 1 week. Wound #2 Left,Lateral Malleolus: Return Appointment in 1 week. Edema Control: Wound #1 Right,Lateral Malleolus: Patient to wear own Velcro compression garment. Elevate legs to the level of the heart and pump ankles as often as possible Wound #2 Left,Lateral Malleolus: Patient to wear own Velcro compression garment. Elevate legs to the level of the heart and pump ankles as often as possible Off-Loading: Wound #1 Right,Lateral Malleolus: Mattress Turn and reposition every 2 hours Other: - do not put pressure on your right ankle Wound #2 Left,Lateral Malleolus: Mattress Turn and reposition  every 2 hours Other: - do not put  pressure on your right ankle Additional Orders / Instructions: Wound #1 Right,Lateral Malleolus: Increase protein intake. Wound #2 Left,Lateral Malleolus: Increase protein intake. Home Health: CHERIL, SLATTERY ([id number]) Wound #1 Right,Lateral Malleolus: [name] Vis[facility] - [place] Please visit patient once this week and check both SNAP VACs for integrity and notify [place] if either needs to be removed and another dressing placed Home Health Nurse may visit PRN to address patient s wound care needs. FACE TO FACE ENCOUNTER: MEDICARE and MEDICAID PATIENTS: I certify that this patient is under my care and that I had a face-to-face encounter that meets the physician face-to-face encounter requirements with this patient on this date. The encounter with the patient was in whole or in part for the following MEDICAL CONDITION: (primary reason for [place]) MEDICAL NECESSITY: I certify, that based on my findings, NURSING services are a medically necessary home health service. HOME BOUND STATUS: I certify that my clinical findings support that this patient is homebound (i.e., Due to illness or injury, pt requires aid of supportive devices such as crutches, cane, wheelchairs, walkers, the use of special transportation or the assistance of another person to leave their place of residence. There is a normal inability to leave the home and doing so requires considerable and taxing effort. Other absences are for medical reasons / religious services and are infrequent or of short duration when for other reasons). If current dressing causes regression in wound condition, may D/C ordered dressing product/s and apply Normal Saline Moist Dressing daily until next [place] / Other MD appointment. [place] of regression in wound condition at [phone]. Please direct any NON-WOUND related issues/requests for orders to patient's  Primary Care Physician Wound #2 Left,Lateral Malleolus: [place] [facility] - [place] Please visit patient once this week and check both SNAP VACs for integrity and notify [place] if either needs to be removed and another dressing placed Home Health Nurse may visit PRN to address patient s wound care needs. FACE TO FACE ENCOUNTER: MEDICARE and MEDICAID PATIENTS: I certify that this patient is under my care and that I had a face-to-face encounter that meets the physician face-to-face encounter requirements with this patient on this date. The encounter with the patient was in whole or in part for the following MEDICAL CONDITION: (primary reason for [place]) MEDICAL NECESSITY: I certify, that based on my findings, NURSING services are a medically necessary home health service. HOME BOUND STATUS: I certify that my clinical findings support that this patient is homebound (i.e., Due to illness or injury, pt requires aid of supportive devices such as crutches, cane, wheelchairs, walkers, the use of special transportation or the assistance of another person to leave their place of residence. There is a normal inability to leave the home and doing so requires considerable and taxing effort. Other absences are for medical reasons / religious services and are infrequent or of short duration when for other reasons). If current dressing causes regression in wound condition, may D/C ordered dressing product/s and apply Normal Saline Moist Dressing daily until next [place] / Other MD appointment. [place] of regression in wound condition at [phone]. Please direct any NON-WOUND related issues/requests for orders to patient's Primary Care Physician Negative Pressure Wound Therapy: Wound #1 Right,Lateral Malleolus: Other: - SNAP VAC Wound #2 Left,Lateral Malleolus: Other: - SNAP VAC My  suggestion currently is gonna be that we go  ahead and initiate snap vac therapy for the left lateral malleolus as well is the right which is already occurring in place. We will subsequently see were things stand at follow-up. If things are not shown signs of improvement with the above measures over the next week and then we will make adjustments as we need to possibly attempting something different. We will go ahead and check into approval for Grafix PL to see if we can get approval since the [place] was denied. Please see above for specific wound care orders. We will see patient for re-evaluation in 1 week(s) here in the clinic. If anything worsens or changes patient will contact our office for additional recommendations. Electronic Signature(s) Signed: 08/30/2018 5:13:23 PM By: Worthy Keeler PA-C Entered By: Worthy Keeler on 08/30/2018 12:54:56 Wessling, [name] ([id number]) -------------------------------------------------------------------------------- ROS/PFSH Details Patient Name: [name] Date of Service: 08/30/2018 11:00 AM Medical Record Number: [id number] Patient Account Number: [id number] Date of Birth/Sex: February 15, 1923 (84 y.o. F) Treating RN: Montey Hora Primary Care Provider: Grayland Ormond Other Clinician: Referring Provider: Grayland Ormond Treating Provider/Extender: Melburn Hake, HOYT Weeks in Treatment: 44 Information Obtained From Patient Wound History Do you currently have one or more open woundso Yes How many open wounds do you currently haveo 1 Approximately how long have you had your woundso 2 yrs How have you been treating your wound(s) until nowo mupirocin, soaking in epsom salt Has your wound(s) ever healed and then re-openedo No Have you had any lab work done in the past montho No Have you tested positive for an antibiotic resistant organism (MRSA, VRE)o No Have you tested positive for osteomyelitis (bone infection)o No Have you had any tests for circulation on your legso Yes Who  ordered the testo Dr. Lucky Cowboy Where was the test doneo [facility] Constitutional Symptoms (General Health) Complaints and Symptoms: Negative for: Fever; Chills Eyes Medical History: Positive for: Cataracts - surgery Respiratory Complaints and Symptoms: No Complaints or Symptoms Cardiovascular Complaints and Symptoms: No Complaints or Symptoms Medical History: Positive for: Congestive Heart Failure; Hypertension Musculoskeletal Medical History: Positive for: Osteoarthritis Neurologic Medical History: Positive for: Neuropathy Oncologic CHRISHA, VOGEL ([id number]) Medical History: Negative for: Received Chemotherapy; Received Radiation Psychiatric Complaints and Symptoms: No Complaints or Symptoms HBO Extended History Items Eyes: Cataracts Immunizations Pneumococcal Vaccine: Received Pneumococcal Vaccination: Yes Implantable Devices Family and Social History Cancer: Yes - Father,Siblings; Diabetes: No; Heart Disease: Yes - Siblings; Hereditary Spherocytosis: No; Hypertension: No; Kidney Disease: No; Lung Disease: No; Seizures: No; Stroke: No; Thyroid Problems: No; Tuberculosis: No; Never smoker; Marital Status - Married; Alcohol Use: Never; Drug Use: No History; Caffeine Use: Rarely; Financial Concerns: No; Food, Clothing or Shelter Needs: No; Support System Lacking: No; Transportation Concerns: No; Advanced Directives: No; Patient does not want information on Advanced Directives; Do not resuscitate: No; Living Will: Yes (Not Provided); Medical Power of Attorney: No Physician Affirmation I have reviewed and agree with the above information. Electronic Signature(s) Signed: 08/30/2018 5:07:50 PM By: Montey Hora Signed: 08/30/2018 5:13:23 PM By: Worthy Keeler PA-C Entered By: Worthy Keeler on 08/30/2018 12:53:02 Erny, [name] ([id number]) -------------------------------------------------------------------------------- SuperBill Details Patient Name: [name] Date of Service: 08/30/2018 Medical Record Number: [id number] Patient Account Number: [id number] Date of Birth/Sex: March 29, 1923 (84 y.o. 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, left ankle and foot Facility Procedures CPT4 Code: 60600459 Description: 97741 NEG PRESS WND TX <=50 SQ CM Modifier: Quantity: 1 CPT4 Code: 42395320 Description: 23343 NEG PRESS WND TX <=50 SQ CM Modifier: Quantity: 1 Physician Procedures CPT4 Code: 5686168 Description: 37290 - WC PHYS LEVEL 3 - EST PT ICD-10 Diagnosis Description 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

## 2018-09-01 NOTE — Progress Notes (Signed)
KYLINA, VULTAGGIO ([id number]) Visit Report for 08/30/2018 Arrival Information Details Patient Name: SHIRA, BOBST Date of Service: 08/30/2018 11:00 AM Medical Record Number: [id number] Patient Account Number: [id number] Date of Birth/Sex: October 31, 1923 (84 y.o. F) Treating RN: Montey Hora Primary Care Lalla Laham: Grayland Ormond Other Clinician: Referring Brindley Madarang: Grayland Ormond Treating Radames Mejorado/Extender: Melburn Hake, HOYT Weeks in Treatment: 74 Visit Information History Since Last Visit Added or deleted any medications: No Patient Arrived: Cane Any new allergies or adverse reactions: No Arrival Time: 11:07 Had a fall or experienced change in No Accompanied By: daughter activities of daily living that may affect Transfer Assistance: None risk of falls: Patient Identification Verified: Yes Signs or symptoms of abuse/neglect since last visito No Secondary Verification Process Yes Hospitalized since last visit: No Completed: Implantable device outside of the clinic excluding No Patient Requires Transmission-Based No cellular tissue based products placed in the center Precautions: since last visit: Patient Has Alerts: Yes Has Dressing in Place as Prescribed: Yes Patient Alerts: ABI AVVS 03/29/18 L Pain Present Now: No 1.05 R 1.03, TBI L .56, R .85 ABI AVVS 07/23/18 L .95 R 1.26 Electronic Signature(s) Signed: 08/30/2018 11:52:00 AM By: Lorine Bears RCP, RRT, CHT Entered By: Lorine Bears on 08/30/2018 11:08:24 Zimmermann, [name] ([id number]) -------------------------------------------------------------------------------- Complex / Palliative Patient Assessment Details Patient Name: [name] Date of Service: 08/30/2018 11:00 AM Medical Record Number: [id number] Patient Account Number: [id number] Date of Birth/Sex: [birth year] (84 y.o. F) Treating RN: Montey Hora Primary Care Faydra Korman: Grayland Ormond Other Clinician: Referring  Neveyah Garzon: Grayland Ormond Treating Amantha Sklar/Extender: Melburn Hake, HOYT Weeks in Treatment: 69 Palliative Management Criteria Complex Wound Management Criteria Patient has remarkable or complex co-morbidities requiring medications or treatments that extend wound healing times. Examples: o Diabetes mellitus with chronic renal failure or end stage renal disease requiring dialysis o Advanced or poorly controlled rheumatoid arthritis o Diabetes mellitus and end stage chronic obstructive pulmonary disease o Active cancer with current chemo- or radiation therapy PAD, lymphedema, osteomyelitis Care Approach Wound Care Plan: Complex Wound Management Electronic Signature(s) Signed: 08/30/2018 12:56:46 PM By: Montey Hora Signed: 08/30/2018 5:13:23 PM By: Worthy Keeler PA-C Entered By: Montey Hora on 08/30/2018 12:56:46 Mckissack, [name] ([id number]) -------------------------------------------------------------------------------- Encounter Discharge Information Details Patient Name: [name] Date of Service: 08/30/2018 11:00 AM Medical Record Number: [id number] Patient Account Number: [id number] Date of Birth/Sex: 11-01-1923 (84 y.o. F) Treating RN: Montey Hora Primary Care Laroy Mustard: Grayland Ormond Other Clinician: Referring Gerldine Suleiman: Grayland Ormond Treating Boneta Standre/Extender: Melburn Hake, HOYT Weeks in Treatment: 34 Encounter Discharge Information Items Discharge Condition: Stable Ambulatory Status: Cane Discharge Destination: Home Transportation: Private Auto Accompanied By: daughter Schedule Follow-up Appointment: Yes Clinical Summary of Care: Electronic Signature(s) Signed: 08/30/2018 5:07:50 PM By: Montey Hora Entered By: Montey Hora on 08/30/2018 12:12:36 Maske, [name] ([id number]) -------------------------------------------------------------------------------- Lower Extremity Assessment Details Patient Name: [name] Date of  Service: 08/30/2018 11:00 AM Medical Record Number: [id number] Patient Account Number: [id number] Date of Birth/Sex: 01-30-1923 (84 y.o. F) Treating RN: Roger Shelter Primary Care Fatime Biswell: Grayland Ormond Other Clinician: Referring Vernessa Likes: Grayland Ormond Treating Delayna Sparlin/Extender: STONE III, HOYT Weeks in Treatment: 55 Edema Assessment Assessed: [Left: No] [Right: No] Edema: [Left: Yes] [Right: Yes] Calf Left: Right: Point of Measurement: 29 cm From Medial Instep 30.5 cm 29 cm Ankle Left: Right: Point of Measurement: 12 cm From Medial Instep 20.5 cm 22.5 cm Vascular Assessment Claudication: Claudication Assessment [Left:None] [Right:None] Pulses: Dorsalis Pedis Palpable: [Left:Yes] [Right:Yes] Posterior Tibial Extremity colors, hair growth, and conditions:  Extremity Color: [Left:Hyperpigmented] [Right:Hyperpigmented] Hair Growth on Extremity: [Left:No] [Right:No] Temperature of Extremity: [Left:Warm] [Right:Warm] Capillary Refill: [Left:< 3 seconds] [Right:< 3 seconds] Toe Nail Assessment Left: Right: Thick: Yes Yes Discolored: Yes Yes Deformed: No No Improper Length and Hygiene: No No Electronic Signature(s) Signed: 08/30/2018 4:22:48 PM By: Roger Shelter Entered By: Roger Shelter on 08/30/2018 11:30:28 Pitcock, [name] ([id number]) -------------------------------------------------------------------------------- Multi Wound Chart Details Patient Name: [name] Date of Service: 08/30/2018 11:00 AM Medical Record Number: [id number] Patient Account Number: [id number] Date of Birth/Sex: 1923/06/23 (84 y.o. F) Treating RN: Montey Hora Primary Care Jacqualine Weichel: Grayland Ormond Other Clinician: Referring Cainen Burnham: Grayland Ormond Treating Sarim Rothman/Extender: STONE III, HOYT Weeks in Treatment: 55 Vital Signs Height(in): 65 Pulse(bpm): 74 Weight(lbs): 154.3 Blood Pressure(mmHg): 117/56 Body Mass Index(BMI): 26 Temperature(F): 98.6 Respiratory  Rate 18 (breaths/min): Photos: [1:No Photos] [2:No Photos] [N/A:N/A] Wound Location: [1:Right Malleolus - Lateral] [2:Left Malleolus - Lateral] [N/A:N/A] Wounding Event: [1:Gradually Appeared] [2:Gradually Appeared] [N/A:N/A] Primary Etiology: [1:Pressure Ulcer] [2:Pressure Ulcer] [N/A:N/A] Secondary Etiology: [1:Arterial Insufficiency Ulcer] [2:Arterial Insufficiency Ulcer] [N/A:N/A] Comorbid History: [1:Cataracts, Congestive Heart Failure, Hypertension, Osteoarthritis, Neuropathy] [2:Cataracts, Congestive Heart Failure, Hypertension, Osteoarthritis, Neuropathy] [N/A:N/A] Date Acquired: [1:08/11/2015] [2:04/12/2018] [N/A:N/A] Weeks of Treatment: [1:55] [2:19] [N/A:N/A] Wound Status: [1:Open] [2:Open] [N/A:N/A] Measurements L x W x D [1:1.5x1.5x0.3] [2:1.6x1.5x1] [N/A:N/A] (cm) Area (cm) : [1:1.767] [2:1.885] [N/A:N/A] Volume (cm) : [1:0.53] [2:1.885] [N/A:N/A] % Reduction in Area: [1:-15.30%] [2:-140.10%] [N/A:N/A] % Reduction in Volume: [1:-15.50%] [2:-2286.10%] [N/A:N/A] Position 1 (o'clock): [2:5] Maximum Distance 1 (cm): [2:1.2] Tunneling: [1:No] [2:Yes] [N/A:N/A] Classification: [1:Category/Stage IV] [2:Category/Stage IV] [N/A:N/A] Exudate Amount: [1:Large] [2:Large] [N/A:N/A] Exudate Type: [1:Sanguinous] [2:Purulent] [N/A:N/A] Exudate Color: [1:red] [2:yellow, brown, green] [N/A:N/A] Wound Margin: [1:Distinct, outline attached] [2:Flat and Intact] [N/A:N/A] Granulation Amount: [1:Large (67-100%)] [2:Small (1-33%)] [N/A:N/A] Granulation Quality: [1:Red] [2:Pink] [N/A:N/A] Necrotic Amount: [1:None Present (0%)] [2:Large (67-100%)] [N/A:N/A] Necrotic Tissue: [1:N/A] [2:Eschar, Adherent Slough] [N/A:N/A] Exposed Structures: [1:Fat Layer (Subcutaneous Tissue) Exposed: Yes Fascia: No Tendon: No Muscle: No Joint: No Bone: No] [2:Fat Layer (Subcutaneous Tissue) Exposed: Yes Fascia: No Tendon: No Muscle: No Joint: No Bone: No] [N/A:N/A] Epithelialization: [1:None] [2:None]  [N/A:N/A] Periwound Skin Texture: Excoriation: No Excoriation: No N/A Induration: No Induration: No Callus: No Callus: No Crepitus: No Crepitus: No Rash: No Rash: No Scarring: No Scarring: No Periwound Skin Moisture: Maceration: Yes Maceration: Yes N/A Dry/Scaly: No Dry/Scaly: No Periwound Skin Color: Rubor: Yes Atrophie Blanche: No N/A Atrophie Blanche: No Cyanosis: No Cyanosis: No Ecchymosis: No Ecchymosis: No Erythema: No Erythema: No Hemosiderin Staining: No Hemosiderin Staining: No Mottled: No Mottled: No Pallor: No Pallor: No Rubor: No Temperature: No Abnormality No Abnormality N/A Tenderness on Palpation: Yes Yes N/A Wound Preparation: Ulcer Cleansing: Ulcer Cleansing: N/A Rinsed/Irrigated with Saline Rinsed/Irrigated with Saline Topical Anesthetic Applied: Topical Anesthetic Applied: Other: lidocaine 4% Other: lidocaine 4% Treatment Notes Electronic Signature(s) Signed: 08/30/2018 5:07:50 PM By: Montey Hora Entered By: Montey Hora on 08/30/2018 11:45:32 Ervine, [name] ([id number]) -------------------------------------------------------------------------------- Scissors Details Patient Name: [name] Date of Service: 08/30/2018 11:00 AM Medical Record Number: [id number] Patient Account Number: [id number] Date of Birth/Sex: 1923-03-30 (84 y.o. F) Treating RN: Montey Hora Primary Care Breauna Mazzeo: Grayland Ormond Other Clinician: Referring Kenley Rettinger: Grayland Ormond Treating Jep Dyas/Extender: Melburn Hake, HOYT Weeks in Treatment: 90 Active Inactive ` Abuse / Safety / Falls / Self Care Management Nursing Diagnoses: Potential for falls Goals: Patient will not experience any injury related to falls Date Initiated: 08/10/2017 Target Resolution Date: 11/10/2017 Goal Status: Active Interventions: Assess Activities of Daily Living upon admission  and as needed Assess fall risk on admission and as needed Assess:  immobility, friction, shearing, incontinence upon admission and as needed Notes: ` Nutrition Nursing Diagnoses: Imbalanced nutrition Potential for alteratiion in Nutrition/Potential for imbalanced nutrition Goals: Patient/caregiver agrees to and verbalizes understanding of need to use nutritional supplements and/or vitamins as prescribed Date Initiated: 08/10/2017 Target Resolution Date: 12/08/2017 Goal Status: Active Interventions: Assess patient nutrition upon admission and as needed per policy Notes: ` Orientation to the Wound Care Program Nursing Diagnoses: Knowledge deficit related to the wound healing center program Goals: Patient/caregiver will verbalize understanding of the [place] Date Initiated: 08/10/2017 Target Resolution Date: 09/08/2017 ANGELYS, YETMAN ([id number]) Goal Status: Active Interventions: Provide education on orientation to the wound center Notes: ` Pain, Acute or Chronic Nursing Diagnoses: Pain, acute or chronic: actual or potential Potential alteration in comfort, pain Goals: Patient/caregiver will verbalize adequate pain control between visits Date Initiated: 08/10/2017 Target Resolution Date: 12/08/2017 Goal Status: Active Interventions: Complete pain assessment as per visit requirements Notes: ` Wound/Skin Impairment Nursing Diagnoses: Impaired tissue integrity Knowledge deficit related to ulceration/compromised skin integrity Goals: Ulcer/skin breakdown will have a volume reduction of 80% by week 12 Date Initiated: 08/10/2017 Target Resolution Date: 12/01/2017 Goal Status: Active Interventions: Assess patient/caregiver ability to perform ulcer/skin care regimen upon admission and as needed Notes: Electronic Signature(s) Signed: 08/30/2018 5:07:50 PM By: Montey Hora Entered By: Montey Hora on 08/30/2018 11:45:23 Pellicano, [name]  ([id number]) -------------------------------------------------------------------------------- Negative Pressure Wound Therapy Application (NPWT) Details Patient Name: LETANYA, FROH. Date of Service: 08/30/2018 11:00 AM Medical Record Number: [id number] Patient Account Number: [id number] Date of Birth/Sex: [birth year] (84 y.o. F) Treating RN: Montey Hora Primary Care Jeidi Gilles: Grayland Ormond Other Clinician: Referring Zyair Russi: Grayland Ormond Treating Lyric Hoar/Extender: STONE III, HOYT Weeks in Treatment: 55 NPWT Application Performed for: Wound #1 Right,Lateral Malleolus Additional Injuries Covered: No Performed By: Montey Hora, RN Type: Other Coverage Size (sq cm): 2.25 Pressure Type: Constant Pressure Setting: 125 mmHG Drain Type: None Primary Contact: Other Other: prisma Quantity of Sponges/Gauze Inserted: 1 Sponge/Dressing Type: Combination Combination: blue Date Initiated: 08/30/2018 Response to Treatment: tolerated Post Procedure Diagnosis Same as Pre-procedure Electronic Signature(s) Signed: 08/30/2018 5:07:50 PM By: Montey Hora Entered By: Montey Hora on 08/30/2018 12:49:05 Klunk, [name] ([id number]) -------------------------------------------------------------------------------- Negative Pressure Wound Therapy Application (NPWT) Details Patient Name: LISABETH, MIAN. Date of Service: 08/30/2018 11:00 AM Medical Record Number: [id number] Patient Account Number: [id number] Date of Birth/Sex: 12-22-1922 (84 y.o. F) Treating RN: Montey Hora Primary Care Kisa Fujii: Grayland Ormond Other Clinician: Referring Elena Davia: Grayland Ormond Treating Alandria Butkiewicz/Extender: STONE III, HOYT Weeks in Treatment: 55 NPWT Application Performed for: Wound #2 Left,Lateral Malleolus Performed By: Montey Hora, RN Type: Other Coverage Size (sq cm): 2.4 Pressure Type: Constant Pressure Setting: 125 mmHG Drain Type: None Primary Contact: Other Other:  prisma Quantity of Sponges/Gauze Inserted: 1 Sponge/Dressing Type: Combination Combination: blue Date Initiated: 08/30/2018 Response to Treatment: tolerated Post Procedure Diagnosis Same as Pre-procedure Electronic Signature(s) Signed: 08/30/2018 5:07:50 PM By: Montey Hora Entered By: Montey Hora on 08/30/2018 12:49:41 Sypher, [name] ([id number]) -------------------------------------------------------------------------------- Pain Assessment Details Patient Name: [name] Date of Service: 08/30/2018 11:00 AM Medical Record Number: [id number] Patient Account Number: [id number] Date of Birth/Sex: 06-Nov-1923 (84 y.o. F) Treating RN: Montey Hora Primary Care Makell Cyr: Grayland Ormond Other Clinician: Referring Tyera Hansley: Grayland Ormond Treating Tamarah Bhullar/Extender: Melburn Hake, HOYT Weeks in Treatment: 66 Active Problems Location of Pain Severity and Description of Pain Patient Has Paino No Site Locations Pain Management and  Medication Current Pain Management: Electronic Signature(s) Signed: 08/30/2018 11:52:00 AM By: Lorine Bears RCP, RRT, CHT Signed: 08/30/2018 5:07:50 PM By: Montey Hora Entered By: Lorine Bears on 08/30/2018 11:08:31 Joni Fears [name] ([id number]) -------------------------------------------------------------------------------- Patient/Caregiver Education Details Patient Name: [name] Date of Service: 08/30/2018 11:00 AM Medical Record Number: [id number] Patient Account Number: [id number] Date of Birth/Gender: 12-27-1922 (84 y.o. F) Treating RN: Montey Hora Primary Care Physician: Grayland Ormond Other Clinician: Referring Physician: Grayland Ormond Treating Physician/Extender: Sharalyn Ink in Treatment: 83 Education Assessment Education Provided To: Patient and Caregiver Education Topics Provided Wound/Skin Impairment: Handouts: Other: wound care as ordered Methods: Demonstration,  Explain/Verbal Responses: State content correctly Electronic Signature(s) Signed: 08/30/2018 5:07:50 PM By: Montey Hora Entered By: Montey Hora on 08/30/2018 11:53:53 Hedgecock, [name] ([id number]) -------------------------------------------------------------------------------- Wound Assessment Details Patient Name: [name] Date of Service: 08/30/2018 11:00 AM Medical Record Number: [id number] Patient Account Number: [id number] Date of Birth/Sex: [birth year] (84 y.o. F) Treating RN: Roger Shelter Primary Care Evie Crumpler: Grayland Ormond Other Clinician: Referring Ricardo Schubach: Grayland Ormond Treating Seira Cody/Extender: STONE III, HOYT Weeks in Treatment: 55 Wound Status Wound Number: 1 Primary Pressure Ulcer Etiology: Wound Location: Right Malleolus - Lateral Secondary Arterial Insufficiency Ulcer Wounding Event: Gradually Appeared Etiology: Date Acquired: 08/11/2015 Wound Status: Open Weeks Of Treatment: 55 Comorbid Cataracts, Congestive Heart Failure, Clustered Wound: No History: Hypertension, Osteoarthritis, Neuropathy Photos Photo Uploaded By: Roger Shelter on 08/30/2018 16:11:48 Wound Measurements Length: (cm) 1.5 Width: (cm) 1.5 Depth: (cm) 0.3 Area: (cm) 1.767 Volume: (cm) 0.53 % Reduction in Area: -15.3% % Reduction in Volume: -15.5% Epithelialization: None Tunneling: No Undermining: No Wound Description Classification: Category/Stage IV Wound Margin: Distinct, outline attached Exudate Amount: Large Exudate Type: Sanguinous Exudate Color: red Foul Odor After Cleansing: No Slough/Fibrino Yes Wound Bed Granulation Amount: Large (67-100%) Exposed Structure Granulation Quality: Red Fascia Exposed: No Necrotic Amount: None Present (0%) Fat Layer (Subcutaneous Tissue) Exposed: Yes Tendon Exposed: No Muscle Exposed: No Joint Exposed: No Bone Exposed: No Periwound Skin Texture Crofford, [name] ([id number]) Texture Color No  Abnormalities Noted: No No Abnormalities Noted: No Callus: No Atrophie Blanche: No Crepitus: No Cyanosis: No Excoriation: No Ecchymosis: No Induration: No Erythema: No Rash: No Hemosiderin Staining: No Scarring: No Mottled: No Pallor: No Moisture Rubor: Yes No Abnormalities Noted: No Dry / Scaly: No Temperature / Pain Maceration: Yes Temperature: No Abnormality Tenderness on Palpation: Yes Wound Preparation Ulcer Cleansing: Rinsed/Irrigated with Saline Topical Anesthetic Applied: Other: lidocaine 4%, Treatment Notes Wound #1 (Right, Lateral Malleolus) 1. Cleansed with: Clean wound with Normal Saline 2. Anesthetic Topical Lidocaine 4% cream to wound bed prior to debridement 3. Peri-wound Care: Skin Prep 4. Dressing Applied: Prisma Ag 8. Negative Pressure Wound Therapy Other (specify in notes) Notes SNAP VAC with mepitel over tendon Electronic Signature(s) Signed: 08/30/2018 4:22:48 PM By: Roger Shelter Entered By: Roger Shelter on 08/30/2018 11:28:06 Sweigert, [name] ([id number]) -------------------------------------------------------------------------------- Wound Assessment Details Patient Name: [name] Date of Service: 08/30/2018 11:00 AM Medical Record Number: [id number] Patient Account Number: [id number] Date of Birth/Sex: Sep 21, 1923 (84 y.o. F) Treating RN: Montey Hora Primary Care Annalena Piatt: Grayland Ormond Other Clinician: Referring Mckenzie Toruno: Grayland Ormond Treating Cynda Soule/Extender: STONE III, HOYT Weeks in Treatment: 55 Wound Status Wound Number: 2 Primary Pressure Ulcer Etiology: Wound Location: Left, Lateral Malleolus Secondary Arterial Insufficiency Ulcer Wounding Event: Gradually Appeared Etiology: Date Acquired: 04/12/2018 Wound Status: Open Weeks Of Treatment: 19 Comorbid Cataracts, Congestive Heart Failure, Clustered Wound: No History: Hypertension, Osteoarthritis, Neuropathy Photos Photo Uploaded By:  Flinchum,  Malachy Mood on 08/30/2018 16:12:13 Wound Measurements Length: (cm) 1.6 Width: (cm) 1.5 Depth: (cm) 1.8 Area: (cm) 1.885 Volume: (cm) 3.393 % Reduction in Area: -140.1% % Reduction in Volume: -4194.9% Epithelialization: None Tunneling: Yes Position (o'clock): 5 Maximum Distance: (cm) 1.2 Undermining: No Wound Description Classification: Category/Stage IV Wound Margin: Flat and Intact Exudate Amount: Large Exudate Type: Purulent Exudate Color: yellow, brown, green Foul Odor After Cleansing: No Slough/Fibrino Yes Wound Bed Granulation Amount: Small (1-33%) Exposed Structure Granulation Quality: Pink Fascia Exposed: No Necrotic Amount: Large (67-100%) Fat Layer (Subcutaneous Tissue) Exposed: Yes Necrotic Quality: Eschar, Adherent Slough Tendon Exposed: No Muscle Exposed: No Joint Exposed: No Gilbo, [name] ([id number]) Bone Exposed: No Periwound Skin Texture Texture Color No Abnormalities Noted: No No Abnormalities Noted: No Callus: No Atrophie Blanche: No Crepitus: No Cyanosis: No Excoriation: No Ecchymosis: No Induration: No Erythema: No Rash: No Hemosiderin Staining: No Scarring: No Mottled: No Pallor: No Moisture Rubor: No No Abnormalities Noted: No Dry / Scaly: No Temperature / Pain Maceration: Yes Temperature: No Abnormality Tenderness on Palpation: Yes Wound Preparation Ulcer Cleansing: Rinsed/Irrigated with Saline Topical Anesthetic Applied: Other: lidocaine 4%, Treatment Notes Wound #2 (Left, Lateral Malleolus) 1. Cleansed with: Clean wound with Normal Saline 2. Anesthetic Topical Lidocaine 4% cream to wound bed prior to debridement 3. Peri-wound Care: Skin Prep 4. Dressing Applied: Prisma Ag 8. Negative Pressure Wound Therapy Other (specify in notes) Notes SNAP VAC with mepitel over tendon Electronic Signature(s) Signed: 08/30/2018 5:07:50 PM By: Montey Hora Entered By: Montey Hora on 08/30/2018 11:51:52 Aull, [name]  ([id number]) -------------------------------------------------------------------------------- [place] Details Patient Name: [name] Date of Service: 08/30/2018 11:00 AM Medical Record Number: [id number] Patient Account Number: [id number] Date of Birth/Sex: 11/30/1923 (84 y.o. F) Treating RN: Montey Hora Primary Care Dorance Spink: Grayland Ormond Other Clinician: Referring Margalit Leece: Grayland Ormond Treating Taimane Stimmel/Extender: Melburn Hake, HOYT Weeks in Treatment: 55 Vital Signs Time Taken: 11:05 Temperature (F): 98.6 Height (in): 65 Pulse (bpm): 74 Weight (lbs): 154.3 Respiratory Rate (breaths/min): 18 Body Mass Index (BMI): 25.7 Blood Pressure (mmHg): 117/56 Reference Range: 80 - 120 mg / dl Electronic Signature(s) Signed: 08/30/2018 11:52:00 AM By: Lorine Bears RCP, RRT, CHT Entered By: Lorine Bears on 08/30/2018 11:11:00

## 2018-09-02 ENCOUNTER — Encounter: Payer: Self-pay | Admitting: Internal Medicine

## 2018-09-02 ENCOUNTER — Ambulatory Visit (INDEPENDENT_AMBULATORY_CARE_PROVIDER_SITE_OTHER): Payer: Medicare Other | Admitting: Internal Medicine

## 2018-09-02 VITALS — BP 141/78 | HR 81 | Temp 97.6°F | Wt 143.4 lb

## 2018-09-02 DIAGNOSIS — M86172 Other acute osteomyelitis, left ankle and foot: Secondary | ICD-10-CM

## 2018-09-02 DIAGNOSIS — K521 Toxic gastroenteritis and colitis: Secondary | ICD-10-CM | POA: Insufficient documentation

## 2018-09-02 DIAGNOSIS — Z5181 Encounter for therapeutic drug level monitoring: Secondary | ICD-10-CM | POA: Insufficient documentation

## 2018-09-02 MED ORDER — CEPHALEXIN 500 MG PO CAPS: 500.0000 mg | ORAL_CAPSULE | Freq: Four times a day (QID) | ORAL | 1 refills | Status: DC

## 2018-09-02 NOTE — Assessment & Plan Note (Signed)
She has had some loose stools and I suspect is antibiotic associated diarrhea.  She will call if it becomes significant such as more than 10 times per day.  Switch the antibiotic also may help.

## 2018-09-02 NOTE — Assessment & Plan Note (Signed)
There has been some slow improvement though per the note from 9/27, some tendon is still showing.  She has completed 2 weeks of the nasal lid and therefore I think it is reasonable to switch back to Keflex and I will give her a one-month supply and see her in about 2 weeks.

## 2018-09-02 NOTE — Progress Notes (Signed)
   Subjective:    Patient ID: [name], female    DOB: Jan 05, 1923, 84 y.o.   MRN: [id number]  HPI Here for follow up of ostemyelitis.  She was intially referred due to concern for osteomyelitis and ended up in the hospital with non-healing left lateral malleolus ulcer.  A recent bone culture done at wound care by Dr. Grayling Congress with [facility].  In the hospital she was noted to have cellulitis and swelling and put on IV cefazolin and improved some and sent out on Keflex.  She though has continued the Augmentin since she still had it.  She underwent a CT evaluation of the left malleolus which was concerning for osteomyleitis with lytic areas on the bone and exposure of the bone.  She is known to have poor vasculature overall.   I started her on Zyvox which she has taken for 2 weeks.  She has noted some loose stools, less than 5/day, significant fatigue and malaise she feels is associated with her antibiotic.  She also recently had a yeast infection and was treated by her primary doctor.  In review of the note from wound care from September 27, the left malleolus does appear to be slowly improving.  She does have a VAC on at this site now as well as the right which is been present.  She is hopeful not to take the same antibiotic.   Review of Systems  Constitutional: Negative for fatigue.  Gastrointestinal: Negative for diarrhea and nausea.  Skin: Negative for rash.  Neurological: Negative for dizziness.       Objective:   Physical Exam  Constitutional: She appears well-developed and well-nourished. No distress.  Eyes: No scleral icterus.  Cardiovascular: Normal rate, regular rhythm and normal heart sounds.  No murmur heard. Pulmonary/Chest: Effort normal and breath sounds normal. No respiratory distress.  Skin: No rash noted.   Past Medical History:  Diagnosis Date  . Hypothyroidism   . Peripheral vascular disease ([place])   . Pneumonia   . Presence of permanent cardiac  pacemaker    2010  . Skin cancer, basal cell    skin cancer   . Stroke ([place])   . Varicose veins          Assessment & Plan:

## 2018-09-02 NOTE — Assessment & Plan Note (Signed)
I will check her CBC today on the low nasal make sure there is no thrombocytopenia or leukopenia.

## 2018-09-03 LAB — CBC WITH DIFFERENTIAL/PLATELET
Basophils Absolute: 50 cells/uL (ref 0–200)
Basophils Relative: 0.7 %
EOS PCT: 2.8 %
Eosinophils Absolute: 199 cells/uL (ref 15–500)
HCT: 35.2 % (ref 35.0–45.0)
Hemoglobin: 11.4 g/dL — ABNORMAL LOW (ref 11.7–15.5)
Lymphs Abs: 2073 cells/uL (ref 850–3900)
MCH: 27.3 pg (ref 27.0–33.0)
MCHC: 32.4 g/dL (ref 32.0–36.0)
MCV: 84.2 fL (ref 80.0–100.0)
MPV: 11.1 fL (ref 7.5–12.5)
Monocytes Relative: 7.2 %
NEUTROS PCT: 60.1 %
Neutro Abs: 4267 cells/uL (ref 1500–7800)
PLATELETS: 122 10*3/uL — AB (ref 140–400)
RBC: 4.18 10*6/uL (ref 3.80–5.10)
RDW: 13.6 % (ref 11.0–15.0)
Total Lymphocyte: 29.2 %
WBC mixed population: 511 cells/uL (ref 200–950)
WBC: 7.1 10*3/uL (ref 3.8–10.8)

## 2018-09-03 LAB — COMPLETE METABOLIC PANEL WITH GFR
AG Ratio: 1 (calc) (ref 1.0–2.5)
ALKALINE PHOSPHATASE (APISO): 89 U/L (ref 33–130)
ALT: 25 U/L (ref 6–29)
AST: 29 U/L (ref 10–35)
Albumin: 3.7 g/dL (ref 3.6–5.1)
BILIRUBIN TOTAL: 0.4 mg/dL (ref 0.2–1.2)
BUN/Creatinine Ratio: 25 (calc) — ABNORMAL HIGH (ref 6–22)
BUN: 34 mg/dL — ABNORMAL HIGH (ref 7–25)
CHLORIDE: 102 mmol/L (ref 98–110)
CO2: 22 mmol/L (ref 20–32)
Calcium: 9.4 mg/dL (ref 8.6–10.4)
Creat: 1.34 mg/dL — ABNORMAL HIGH (ref 0.60–0.88)
GFR, EST AFRICAN AMERICAN: 39 mL/min/{1.73_m2} — AB (ref >=60)
GFR, Est Non African American: 34 mL/min/{1.73_m2} — ABNORMAL LOW (ref >=60)
GLUCOSE: 135 mg/dL — AB (ref 65–99)
Globulin: 3.8 g/dL (calc) — ABNORMAL HIGH (ref 1.9–3.7)
POTASSIUM: 5.2 mmol/L (ref 3.5–5.3)
Sodium: 137 mmol/L (ref 135–146)
TOTAL PROTEIN: 7.5 g/dL (ref 6.1–8.1)

## 2018-09-03 LAB — SEDIMENTATION RATE: Sed Rate: 25 mm/h (ref 0–30)

## 2018-09-03 LAB — C-REACTIVE PROTEIN: CRP: 3.1 mg/L (ref <8.0)

## 2018-09-06 ENCOUNTER — Encounter: Payer: Medicare Other | Attending: Physician Assistant | Admitting: Physician Assistant

## 2018-09-06 DIAGNOSIS — L89514 Pressure ulcer of right ankle, stage 4: Secondary | ICD-10-CM | POA: Diagnosis present

## 2018-09-06 DIAGNOSIS — Z950 Presence of cardiac pacemaker: Secondary | ICD-10-CM | POA: Insufficient documentation

## 2018-09-06 DIAGNOSIS — I70243 Atherosclerosis of native arteries of left leg with ulceration of ankle: Secondary | ICD-10-CM | POA: Insufficient documentation

## 2018-09-06 DIAGNOSIS — L89524 Pressure ulcer of left ankle, stage 4: Secondary | ICD-10-CM | POA: Insufficient documentation

## 2018-09-06 DIAGNOSIS — M86372 Chronic multifocal osteomyelitis, left ankle and foot: Secondary | ICD-10-CM | POA: Diagnosis not present

## 2018-09-06 DIAGNOSIS — I70233 Atherosclerosis of native arteries of right leg with ulceration of ankle: Secondary | ICD-10-CM | POA: Insufficient documentation

## 2018-09-11 ENCOUNTER — Encounter: Payer: Medicare Other | Admitting: Internal Medicine

## 2018-09-11 DIAGNOSIS — I70243 Atherosclerosis of native arteries of left leg with ulceration of ankle: Secondary | ICD-10-CM | POA: Diagnosis not present

## 2018-09-11 NOTE — Progress Notes (Signed)
MEENA, BARRANTES ([id number]) Visit Report for 09/06/2018 Arrival Information Details Patient Name: [name], [name] Date of Service: 09/06/2018 11:00 AM Medical Record Number: [id number] Patient Account Number: [id number] Date of Birth/Sex: 1922-12-14 (84 y.o. F) Treating RN: Montey Hora Primary Care Christiann Hagerty: Grayland Ormond Other Clinician: Referring Analeia Ismael: Grayland Ormond Treating Javen Ridings/Extender: Melburn Hake, HOYT Weeks in Treatment: 5 Visit Information History Since Last Visit Added or deleted any medications: Yes Patient Arrived: Ambulatory Any new allergies or adverse reactions: No Arrival Time: 11:13 Had a fall or experienced change in No Accompanied By: daughter activities of daily living that may affect Transfer Assistance: None risk of falls: Patient Identification Verified: Yes Signs or symptoms of abuse/neglect since last visito No Secondary Verification Process Yes Hospitalized since last visit: No Completed: Implantable device outside of the clinic excluding No Patient Requires Transmission-Based No cellular tissue based products placed in the center Precautions: since last visit: Patient Has Alerts: Yes Has Dressing in Place as Prescribed: Yes Patient Alerts: ABI AVVS 03/29/18 L Pain Present Now: No 1.05 R 1.03, TBI L .56, R .85 ABI AVVS 07/23/18 L .95 R 1.26 Electronic Signature(s) Signed: 09/06/2018 4:03:51 PM By: Lorine Bears RCP, RRT, CHT Entered By: Lorine Bears on 09/06/2018 11:15:45 Bobo, [name] ([id number]) -------------------------------------------------------------------------------- Encounter Discharge Information Details Patient Name: [name] Date of Service: 09/06/2018 11:00 AM Medical Record Number: [id number] Patient Account Number: [id number] Date of Birth/Sex: 07-12-1923 (84 y.o. F) Treating RN: Cornell Barman Primary Care Sou Nohr: Grayland Ormond Other Clinician: Referring  Rachard Isidro: Grayland Ormond Treating Lazaro Isenhower/Extender: Melburn Hake, HOYT Weeks in Treatment: 56 Encounter Discharge Information Items Discharge Condition: Stable Ambulatory Status: Ambulatory Discharge Destination: Home Transportation: Private Auto Accompanied By: daughter Schedule Follow-up Appointment: Yes Clinical Summary of Care: Electronic Signature(s) Signed: 09/06/2018 5:11:12 PM By: Gretta Cool, BSN, RN, CWS, Kim RN, BSN Entered By: Gretta Cool, BSN, RN, CWS, Kim on 09/06/2018 12:26:54 [name] ([id number]) -------------------------------------------------------------------------------- Lower Extremity Assessment Details Patient Name: [name], [name] Date of Service: 09/06/2018 11:00 AM Medical Record Number: [id number] Patient Account Number: [id number] Date of Birth/Sex: Nov 02, 1923 (84 y.o. F) Treating RN: Secundino Ginger Primary Care Lorilyn Laitinen: Grayland Ormond Other Clinician: Referring Lorrin Bodner: Grayland Ormond Treating Aynsley Fleet/Extender: Melburn Hake, HOYT Weeks in Treatment: 56 Edema Assessment Assessed: [Left: No] [Right: No] Edema: [Left: No] [Right: No] Calf Left: Right: Point of Measurement: 29 cm From Medial Instep 29 cm 26 cm Ankle Left: Right: Point of Measurement: 12 cm From Medial Instep 20.5 cm 19 cm Vascular Assessment Claudication: Claudication Assessment [Left:None] [Right:None] Pulses: Dorsalis Pedis Palpable: [Left:Yes] [Right:Yes] Posterior Tibial Extremity colors, hair growth, and conditions: Extremity Color: [Left:Hyperpigmented] [Right:Hyperpigmented] Temperature of Extremity: [Left:Warm] [Right:Warm] Capillary Refill: [Left:< 3 seconds] [Right:< 3 seconds] Toe Nail Assessment Left: Right: Thick: Yes Yes Discolored: Yes Yes Deformed: No No Improper Length and Hygiene: No No Electronic Signature(s) Signed: 09/06/2018 4:32:55 PM By: Secundino Ginger Entered By: Secundino Ginger on 09/06/2018 11:38:18 Laster, [name]  ([id number]) -------------------------------------------------------------------------------- Multi Wound Chart Details Patient Name: [name] Date of Service: 09/06/2018 11:00 AM Medical Record Number: [id number] Patient Account Number: [id number] Date of Birth/Sex: 09-Mar-1923 (84 y.o. F) Treating RN: Montey Hora Primary Care Jachob Mcclean: Grayland Ormond Other Clinician: Referring Drusilla Wampole: Grayland Ormond Treating Richerd Grime/Extender: STONE III, HOYT Weeks in Treatment: 56 Vital Signs Height(in): 65 Pulse(bpm): 70 Weight(lbs): 154.3 Blood Pressure(mmHg): 108/50 Body Mass Index(BMI): 26 Temperature(F): 98.1 Respiratory Rate 18 (breaths/min): Photos: [1:No Photos] [2:No Photos] [N/A:N/A] Wound Location: [1:Right Malleolus - Lateral] [2:Left Malleolus - Lateral] [N/A:N/A] Wounding Event: [  1:Gradually Appeared] [2:Gradually Appeared] [N/A:N/A] Primary Etiology: [1:Pressure Ulcer] [2:Pressure Ulcer] [N/A:N/A] Secondary Etiology: [1:Arterial Insufficiency Ulcer] [2:Arterial Insufficiency Ulcer] [N/A:N/A] Comorbid History: [1:Cataracts, Congestive Heart Failure, Hypertension, Osteoarthritis, Neuropathy] [2:Cataracts, Congestive Heart Failure, Hypertension, Osteoarthritis, Neuropathy] [N/A:N/A] Date Acquired: [1:08/11/2015] [2:04/12/2018] [N/A:N/A] Weeks of Treatment: [1:56] [2:20] [N/A:N/A] Wound Status: [1:Open] [2:Open] [N/A:N/A] Measurements L x W x D [1:1.2x1.4x0.3] [2:1.6x1.4x1] [N/A:N/A] (cm) Area (cm) : [1:1.319] [2:1.759] [N/A:N/A] Volume (cm) : [1:0.396] [2:1.759] [N/A:N/A] % Reduction in Area: [1:13.90%] [2:-124.10%] [N/A:N/A] % Reduction in Volume: [1:13.70%] [2:-2126.60%] [N/A:N/A] Classification: [1:Category/Stage IV] [2:Category/Stage IV] [N/A:N/A] Exudate Amount: [1:Medium] [2:Large] [N/A:N/A] Exudate Type: [1:Serosanguineous] [2:Serosanguineous] [N/A:N/A] Exudate Color: [1:red, brown] [2:red, brown] [N/A:N/A] Wound Margin: [1:Distinct, outline attached]  [2:Flat and Intact] [N/A:N/A] Granulation Amount: [1:Small (1-33%)] [2:Small (1-33%)] [N/A:N/A] Granulation Quality: [1:Pink] [2:Pink] [N/A:N/A] Necrotic Amount: [1:Small (1-33%)] [2:Large (67-100%)] [N/A:N/A] Necrotic Tissue: [1:Adherent Slough] [2:Eschar, Adherent Slough] [N/A:N/A] Exposed Structures: [1:Fat Layer (Subcutaneous Tissue) Exposed: Yes Fascia: No Tendon: No Muscle: No Joint: No Bone: No] [2:Fat Layer (Subcutaneous Tissue) Exposed: Yes Fascia: No Tendon: No Muscle: No Joint: No Bone: No] [N/A:N/A] Epithelialization: [1:None] [2:None] [N/A:N/A] Periwound Skin Texture: [1:Excoriation: No Induration: No Callus: No] [2:Excoriation: No Induration: No Callus: No] [N/A:N/A] Crepitus: No Crepitus: No Rash: No Rash: No Scarring: No Scarring: No Periwound Skin Moisture: Maceration: Yes Maceration: Yes N/A Dry/Scaly: No Dry/Scaly: No Periwound Skin Color: Rubor: Yes Atrophie Blanche: No N/A Atrophie Blanche: No Cyanosis: No Cyanosis: No Ecchymosis: No Ecchymosis: No Erythema: No Erythema: No Hemosiderin Staining: No Hemosiderin Staining: No Mottled: No Mottled: No Pallor: No Pallor: No Rubor: No Temperature: No Abnormality No Abnormality N/A Tenderness on Palpation: Yes Yes N/A Wound Preparation: Ulcer Cleansing: Ulcer Cleansing: N/A Rinsed/Irrigated with Saline Rinsed/Irrigated with Saline Topical Anesthetic Applied: Topical Anesthetic Applied: Other: lidocaine 4% Other: lidocaine 4% Treatment Notes Electronic Signature(s) Signed: 09/06/2018 5:05:51 PM By: Montey Hora Entered By: Montey Hora on 09/06/2018 12:09:29 [name] ([id number]) -------------------------------------------------------------------------------- [place] Details Patient Name: [name] Date of Service: 09/06/2018 11:00 AM Medical Record Number: [id number] Patient Account Number: [id number] Date of Birth/Sex: 1923/01/28 (84 y.o. F) Treating RN:  Montey Hora Primary Care Gerilynn Mccullars: Grayland Ormond Other Clinician: Referring Terrelle Ruffolo: Grayland Ormond Treating Malikhi Ogan/Extender: Melburn Hake, HOYT Weeks in Treatment: 57 Active Inactive ` Abuse / Safety / Falls / Self Care Management Nursing Diagnoses: Potential for falls Goals: Patient will not experience any injury related to falls Date Initiated: 08/10/2017 Target Resolution Date: 11/10/2017 Goal Status: Active Interventions: Assess Activities of Daily Living upon admission and as needed Assess fall risk on admission and as needed Assess: immobility, friction, shearing, incontinence upon admission and as needed Notes: ` Nutrition Nursing Diagnoses: Imbalanced nutrition Potential for alteratiion in Nutrition/Potential for imbalanced nutrition Goals: Patient/caregiver agrees to and verbalizes understanding of need to use nutritional supplements and/or vitamins as prescribed Date Initiated: 08/10/2017 Target Resolution Date: 12/08/2017 Goal Status: Active Interventions: Assess patient nutrition upon admission and as needed per policy Notes: ` Orientation to the Wound Care Program Nursing Diagnoses: Knowledge deficit related to the wound healing center program Goals: Patient/caregiver will verbalize understanding of the [facility] Date Initiated: 08/10/2017 Target Resolution Date: 09/08/2017 [name], [name] ([id number]) Goal Status: Active Interventions: Provide education on orientation to the wound center Notes: ` Pain, Acute or Chronic Nursing Diagnoses: Pain, acute or chronic: actual or potential Potential alteration in comfort, pain Goals: Patient/caregiver will verbalize adequate pain control between visits Date Initiated: 08/10/2017 Target Resolution Date: 12/08/2017 Goal Status: Active Interventions: Complete pain assessment as  per visit requirements Notes: ` Wound/Skin Impairment Nursing Diagnoses: Impaired tissue integrity Knowledge  deficit related to ulceration/compromised skin integrity Goals: Ulcer/skin breakdown will have a volume reduction of 80% by week 12 Date Initiated: 08/10/2017 Target Resolution Date: 12/01/2017 Goal Status: Active Interventions: Assess patient/caregiver ability to perform ulcer/skin care regimen upon admission and as needed Notes: Electronic Signature(s) Signed: 09/06/2018 5:05:51 PM By: Montey Hora Entered By: Montey Hora on 09/06/2018 12:09:22 [name] ([id number]) -------------------------------------------------------------------------------- Pain Assessment Details Patient Name: [name] Date of Service: 09/06/2018 11:00 AM Medical Record Number: [id number] Patient Account Number: [id number] Date of Birth/Sex: 09-08-1923 (84 y.o. F) Treating RN: Montey Hora Primary Care Gillis Boardley: Grayland Ormond Other Clinician: Referring Lenon Kuennen: Grayland Ormond Treating Otha Rickles/Extender: Melburn Hake, HOYT Weeks in Treatment: 57 Active Problems Location of Pain Severity and Description of Pain Patient Has Paino No Site Locations Pain Management and Medication Current Pain Management: Electronic Signature(s) Signed: 09/06/2018 4:03:51 PM By: Lorine Bears RCP, RRT, CHT Signed: 09/06/2018 5:05:51 PM By: Montey Hora Entered By: Lorine Bears on 09/06/2018 11:15:52 Gail, [name] ([id number]) -------------------------------------------------------------------------------- Patient/Caregiver Education Details Patient Name: [name] Date of Service: 09/06/2018 11:00 AM Medical Record Number: [id number] Patient Account Number: [id number] Date of Birth/Gender: [birth year] (84 y.o. F) Treating RN: Cornell Barman Primary Care Physician: Grayland Ormond Other Clinician: Referring Physician: Grayland Ormond Treating Physician/Extender: Sharalyn Ink in Treatment: 49 Education Assessment Education Provided To: Patient and  Caregiver Education Topics Provided Wound/Skin Impairment: Handouts: Caring for Your Ulcer, Other: snap vac instructions reviewed with patient Methods: Demonstration, Explain/Verbal Responses: State content correctly Electronic Signature(s) Signed: 09/06/2018 5:11:12 PM By: Gretta Cool, BSN, RN, CWS, Kim RN, BSN Entered By: Gretta Cool, BSN, RN, CWS, Kim on 09/06/2018 12:27:36 Gerstner, [name] ([id number]) -------------------------------------------------------------------------------- Wound Assessment Details Patient Name: [name] Date of Service: 09/06/2018 11:00 AM Medical Record Number: [id number] Patient Account Number: [id number] Date of Birth/Sex: 29-Mar-1923 (84 y.o. F) Treating RN: Secundino Ginger Primary Care Gabrielly Mccrystal: Grayland Ormond Other Clinician: Referring Sakai Heinle: Grayland Ormond Treating Kady Toothaker/Extender: STONE III, HOYT Weeks in Treatment: 56 Wound Status Wound Number: 1 Primary Pressure Ulcer Etiology: Wound Location: Right Malleolus - Lateral Secondary Arterial Insufficiency Ulcer Wounding Event: Gradually Appeared Etiology: Date Acquired: 08/11/2015 Wound Status: Open Weeks Of Treatment: 56 Comorbid Cataracts, Congestive Heart Failure, Clustered Wound: No History: Hypertension, Osteoarthritis, Neuropathy Photos Photo Uploaded By: Secundino Ginger on 09/06/2018 16:02:09 Wound Measurements Length: (cm) 1.2 % Reductio Width: (cm) 1.4 % Reductio Depth: (cm) 0.3 Epithelial Area: (cm) 1.319 Tunneling Volume: (cm) 0.396 Undermini n in Area: 13.9% n in Volume: 13.7% ization: None : No ng: No Wound Description Classification: Category/Stage IV Foul Odor Wound Margin: Distinct, outline attached Slough/Fib Exudate Amount: Medium Exudate Type: Serosanguineous Exudate Color: red, brown After Cleansing: No rino Yes Wound Bed Granulation Amount: Small (1-33%) Exposed Structure Granulation Quality: Pink Fascia Exposed: No Necrotic Amount: Small (1-33%) Fat Layer  (Subcutaneous Tissue) Exposed: Yes Necrotic Quality: Adherent Slough Tendon Exposed: No Muscle Exposed: No Joint Exposed: No Bone Exposed: No Periwound Skin Texture [place], [name] ([id number]) Texture Color No Abnormalities Noted: No No Abnormalities Noted: No Callus: No Atrophie Blanche: No Crepitus: No Cyanosis: No Excoriation: No Ecchymosis: No Induration: No Erythema: No Rash: No Hemosiderin Staining: No Scarring: No Mottled: No Pallor: No Moisture Rubor: Yes No Abnormalities Noted: No Dry / Scaly: No Temperature / Pain Maceration: Yes Temperature: No Abnormality Tenderness on Palpation: Yes Wound Preparation Ulcer Cleansing: Rinsed/Irrigated with Saline Topical Anesthetic Applied: Other: lidocaine 4%, Treatment  Notes Wound #1 (Right, Lateral Malleolus) Notes prisma, drawtex on right; mepitel one over tendon, prisma ag, snap vac on left Electronic Signature(s) Signed: 09/06/2018 4:32:55 PM By: Secundino Ginger Entered By: Secundino Ginger on 09/06/2018 11:34:10 Opfer, [name] ([id number]) -------------------------------------------------------------------------------- Wound Assessment Details Patient Name: [name] Date of Service: 09/06/2018 11:00 AM Medical Record Number: [id number] Patient Account Number: [id number] Date of Birth/Sex: 07-04-1923 (84 y.o. F) Treating RN: Secundino Ginger Primary Care Patric Vanpelt: Grayland Ormond Other Clinician: Referring Rhyder Bratz: Grayland Ormond Treating Brenleigh Collet/Extender: STONE III, HOYT Weeks in Treatment: 56 Wound Status Wound Number: 2 Primary Pressure Ulcer Etiology: Wound Location: Left Malleolus - Lateral Secondary Arterial Insufficiency Ulcer Wounding Event: Gradually Appeared Etiology: Date Acquired: 04/12/2018 Wound Status: Open Weeks Of Treatment: 20 Comorbid Cataracts, Congestive Heart Failure, Clustered Wound: No History: Hypertension, Osteoarthritis, Neuropathy Photos Photo Uploaded By: Secundino Ginger on  09/06/2018 16:02:10 Wound Measurements Length: (cm) 1.6 % Reduction Width: (cm) 1.4 % Reduction Depth: (cm) 1 Epitheliali Area: (cm) 1.759 Tunneling: Volume: (cm) 1.759 Underminin in Area: -124.1% in Volume: -2126.6% zation: None No g: No Wound Description Classification: Category/Stage IV Foul Odor A Wound Margin: Flat and Intact Slough/Fibr Exudate Amount: Large Exudate Type: Serosanguineous Exudate Color: red, brown fter Cleansing: No ino Yes Wound Bed Granulation Amount: Small (1-33%) Exposed Structure Granulation Quality: Pink Fascia Exposed: No Necrotic Amount: Large (67-100%) Fat Layer (Subcutaneous Tissue) Exposed: Yes Necrotic Quality: Eschar, Adherent Slough Tendon Exposed: No Muscle Exposed: No Joint Exposed: No Bone Exposed: No Periwound Skin Texture [place], [name] ([id number]) Texture Color No Abnormalities Noted: No No Abnormalities Noted: No Callus: No Atrophie Blanche: No Crepitus: No Cyanosis: No Excoriation: No Ecchymosis: No Induration: No Erythema: No Rash: No Hemosiderin Staining: No Scarring: No Mottled: No Pallor: No Moisture Rubor: No No Abnormalities Noted: No Dry / Scaly: No Temperature / Pain Maceration: Yes Temperature: No Abnormality Tenderness on Palpation: Yes Wound Preparation Ulcer Cleansing: Rinsed/Irrigated with Saline Topical Anesthetic Applied: Other: lidocaine 4%, Treatment Notes Wound #2 (Left, Lateral Malleolus) Notes prisma, drawtex on right; mepite one over tendon, prisma, snap vac Electronic Signature(s) Signed: 09/06/2018 4:32:55 PM By: Secundino Ginger Entered By: Secundino Ginger on 09/06/2018 11:35:58 Mccutcheon, [name] ([id number]) -------------------------------------------------------------------------------- Vitals Details Patient Name: [name] Date of Service: 09/06/2018 11:00 AM Medical Record Number: [id number] Patient Account Number: [id number] Date of Birth/Sex: 01/07/1923 (84 y.o.  F) Treating RN: Montey Hora Primary Care Devondre Guzzetta: Grayland Ormond Other Clinician: Referring Gali Spinney: Grayland Ormond Treating Aditya Nastasi/Extender: Melburn Hake, HOYT Weeks in Treatment: 56 Vital Signs Time Taken: 11:15 Temperature (F): 98.1 Height (in): 65 Pulse (bpm): 70 Weight (lbs): 154.3 Respiratory Rate (breaths/min): 18 Body Mass Index (BMI): 25.7 Blood Pressure (mmHg): 108/50 Reference Range: 80 - 120 mg / dl Electronic Signature(s) Signed: 09/06/2018 4:03:51 PM By: Lorine Bears RCP, RRT, CHT Entered By: Lorine Bears on 09/06/2018 11:21:37

## 2018-09-12 NOTE — Progress Notes (Signed)
[name], [name] ([id number]) Visit Report for 09/06/2018 Chief Complaint Document Details Patient Name: [name], [name]. Date of Service: 09/06/2018 11:00 AM Medical Record Number: [id number] Patient Account Number: [id number] Date of Birth/Sex: 02-20-1923 (84 y.o. F) Treating RN: Montey Hora Primary Care Provider: Grayland Ormond Other Clinician: Referring Provider: Grayland Ormond Treating Provider/Extender: Melburn Hake, HOYT Weeks in Treatment: 52 Information Obtained from: Patient Chief Complaint Patient is here for right lateral malleolus and left lateral malleolus ulcer Electronic Signature(s) Signed: 09/09/2018 1:32:29 PM By: Worthy Keeler PA-C Entered By: Worthy Keeler on 09/06/2018 11:50:22 [name] ([id number]) -------------------------------------------------------------------------------- HPI Details Patient Name: [name] Date of Service: 09/06/2018 11:00 AM Medical Record Number: [id number] Patient Account Number: [id number] Date of Birth/Sex: May 23, 1923 (84 y.o. 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 Dr. Amalia Hailey the podiatrist saw her on 07/20/2017 and sent her to the wound center for possible hyperbaric oxygen therapy. past medical history of peripheral vascular disease, varicose veins, status post appendectomy, basal cell carcinoma excision from the left leg, cholecystectomy, pacemaker placement, right lower extremity angiography done by Dr. dew in March 2017 with placement of a stent. there is also note of a successful ablation of the right small saphenous  vein done which was reviewed by ultrasound on 10/24/2016. the patient had a right small saphenous vein ablation done on 10/20/2016. The patient has never been a smoker. She has been seen by Dr. Corene Cornea dew the vascular surgeon who most recently saw her on 06/15/2017 for evaluation of ongoing problems with right leg swelling. She had a lower extremity arterial duplex examination done(02/13/17) which showed patent distal right superficial femoral artery stent and above-the-knee popliteal stent without evidence of restenosis. The ABI was more than 1.3 on the right and more than 1.3 on the left. This was consistent with noncompressible arteries due to medial calcification. The right great toe pressure and PPG waveforms are within normal limits and the left great toe pressure and PPG waveforms are decreased. he recommended she continue to wear her compression stockings and continue with elevation. She is scheduled to have a noninvasive arterial study in the near future 08/16/2017 -- had a lower extremity arterial duplex examination done which showed patent distal right superficial femoral artery stent and above-the-knee popliteal stent without evidence of restenosis. The ABI was more than 1.3 on the right and more than 1.3 on the left. This was consistent with noncompressible arteries due to medial calcification. The right great toe pressure and PPG waveforms are within normal limits and the left great toe pressure and PPG waveforms are decreased. the x-ray of the right ankle has not yet been done 08/24/2017 -- had a right ankle x-ray -- IMPRESSION:1. No fracture, bone lesion or evidence of osteomyelitis. 2. Lateral soft tissue swelling with a soft tissue ulcer. she has not yet seen the vascular surgeon for review 08/31/17 on evaluation today patient's wound appears to be showing signs of improvement. She still with her appointment with vascular in order to review her results of her vascular study and  then determine if any intervention would be recommended at that time. No fevers, chills, nausea, or vomiting noted at this time. She has  been tolerating the dressing changes without complication. 09/28/17 on evaluation today patient's wound appears to show signs of good improvement in regard to the granulation tissue which is surfacing. There is still a layer of slough covering the wound and the posterior portion is still significantly deeper than the anterior nonetheless there has been some good sign of things moving towards the better. She is going to go back to Dr. dew for reevaluation to ensure her blood flow is still appropriate. That will be before her next evaluation with [place] next week. No fevers, chills, nausea, or vomiting noted at this time. Patient does have some discomfort rated to be a 3-4/10 depending on activity specifically cleansing the wound makes it worse. 10/05/2017 -- the patient was seen by Dr. Lucky Cowboy last week and noninvasive studies showed a normal right ABI with brisk triphasic waveforms consistent with no arterial insufficiency including normal digital pressures. The duplex showed a patent distal right SFA stent and the proximal SFA was also normal. He was pleased with her test and thought she should have enough of perfusion for normal wound healing. He would see her back in 6 months time. 12/21/17 on evaluation today patient appears to be doing fairly well in regard to her right lateral ankle wound. Unfortunately the main issue that she is expansion at this point is that she is having some issues with what appears to be some cellulitis in the [name], [name]. ([id number]) right anterior shin. She has also been noting a little bit of uncomfortable feeling especially last night and her ankle area. I'm afraid that she made the developing a little bit of an infection. With that being said I think it is in the early stages. 12/28/17 on evaluation today patient's ankle appears to  be doing excellent. She's making good progress at this point the cellulitis seems to have improved after last week's evaluation. Overall she is having no significant discomfort which is excellent news. She does have an appointment with Dr. dew on March 29, 2018 for reevaluation in regard to the stent he placed. She seems to have excellent blood flow in the right lower extremity. 01/19/12 on evaluation today patient's wound appears to be doing very well. In fact she does not appear to require debridement at this point, there's no evidence of infection, and overall from the standpoint of the wound she seems to be doing very well. With that being said I believe that it may be time to switch to different dressing away from the [facility] Dressing she tells me she does have a lot going on her friend actually passed away yesterday and she's also having a lot of issues with her husband this obviously is weighing heavy on her as far as your thoughts and concerns today. 01/25/18 on evaluation today patient appears to be doing fairly well in regard to her right lateral malleolus. She has been tolerating the dressing changes without complication. Overall I feel like this is definitely showing signs of improvement as far as how the overall appearance of the wound is there's also evidence of epithelium start to migrate over the granulation tissue. In general I think that she is progressing nicely as far as the wound is concerned. The only concern she really has is whether or not we can switch to every other week visits in order to avoid having as many appointments as her daughters have a difficult time getting her to her appointments as well as the patient's husband to his he is  not doing very well at this point. 02/22/18 on evaluation today patient's right lateral malleolus ulcer appears to be doing great. She has been tolerating the dressing changes without complication. Overall you making excellent progress  at this time. Patient is having no significant discomfort. 03/15/18 on evaluation today patient appears to be doing much more poorly in regard to her right lateral ankle ulcer at this point. Unfortunately since have last seen her her husband has passed just a few days ago is obviously weighed heavily on her her daughter also had surgery well she is with her today as usual. There does not appear to be any evidence of infection she does seem to have significant contusion/deep tissue injury to the right lateral malleolus which was not noted previous when I saw her last. It's hard to tell of exactly when this injury occurred although during the time she was spending the night in the hospital this may have been most likely. 03/22/18 on evaluation today patient appears to actually be doing very well in regard to her ulcer. She did unfortunately have a setback which was noted last week however the good news is we seem to be getting back on track and in fact the wound in the core did still have some necrotic tissue which will be addressed at this point today but in general I'm seeing signs that things are on the up and up. She is glad to hear this obviously she's been somewhat concerned that due to the how her wound digressed more recently. 03/29/18 on evaluation today patient appears to be doing fairly well in regard to her right lower extremity lateral malleolus ulcer. She unfortunately does have a new area of pressure injury over the inferior portion where the wound has opened up a little bit larger secondary to the pressure she seems to be getting. She does tell me sometimes when she sleeps at night that it actually hurts and does seem to be pushing on the area little bit more unfortunately. There does not appear to be any evidence of infection which is good news. She has been tolerating the dressing changes without complication. She also did have some bruising in the left second and third toes due to the  fact that she may have bump this or injured it although she has neuropathy so she does not feel she did move recently that may have been where this came from. Nonetheless there does not appear to be any evidence of infection at this time. 04/12/18 on evaluation today patient's wound on the right lateral ankle actually appears to be doing a little bit better with a lot of necrotic docking tissue centrally loosening up in clearing away. However she does have the beginnings of a deep tissue injury on the left lateral malleolus likely due to the fact we've been trying offload the right as much as we have. I think she may benefit from an assistive soft device to help with offloading and it looks like they're looking at one of the doughnut conditions that wraps around the lower leg to offload which I think will definitely do a good job. With that being said I think we definitely need to address this issue on the left before it becomes a wound. Patient is not having significant pain. 04/19/18 on evaluation today patient appears to be doing excellent in regard to the progress she's made with her right lateral ankle ulcer. The left ankle region which did show evidence of a deep tissue injury seems  to be resolving there's little fluid noted underneath and a blister there's nothing open at this point in time overall I feel like this is progressing nicely which is good news. She does not seem to be having significant discomfort at this point which is also good news. 04/25/18-She is here in follow up evaluation for bilateral lateral malleolar ulcers. The right lateral malleolus ulcer with pale subcutaneous tissue exposure, central area of ulcer with tendon/periosteum exposed. The left lateral malleolus ulcer now with [name], [name] ([id number]) central area of nonviable tissue, otherwise deep tissue injury. She is wearing compression wraps to the left lower extremity, she will place the right lower extremity  compression wraps on when she gets home. She will be out of town over the weekend and return next week and follow-up appointment. She completed her doxycycline this morning 05/03/18 on evaluation today patient appears to be doing very well in regard to her right lateral ankle ulcer in general. At least she's showing some signs of improvement in this regard. Unfortunately she has some additional injury to the left lateral malleolus region which appears to be new likely even over the past several days. Again this determination is based on the overall appearance. With that being said the patient is obviously frustrated about this currently. 05/10/18-She is here in follow-up evaluation for bilateral lateral malleolar ulcers. She states she has purchased offloading shoes/boots and they will arrive tomorrow. She was asked to bring them in the office at next week's appointment so her provider is aware of product being utilized. She continues to sleep on right or left side, she has been encouraged to sleep on her back. The right lateral malleolus ulcer is precariously close to peri-osteum; will order xray. The left lateral malleolus ulcer is improved. Will switch back to santyl; she will follow up next week. 05/17/18 on evaluation today patient actually appears to be doing very well in regard to her malleolus her ulcers compared to last time I saw them. She does not seem to have as much in the way of contusion at this point which is great news. With that being said she does continue to have discomfort and I do believe that she is still continuing to benefit from the offloading/pressure reducing boots that were recommended. I think this is the key to trying to get this to heal up completely. 05/24/18 on evaluation today patient actually appears to be doing worse at this point in time unfortunately compared to her last week's evaluation. She is having really no increased pain which is good news unfortunately she  does have more maceration in your theme and noted surrounding the right lateral ankle the left lateral ankle is not really is erythematous I do not see signs of the overt cellulitis on that side. Unfortunately the wounds do not seem to have shown any signs of improvement since the last evaluation. She also has significant swelling especially on the right compared to previous some of this may be due to infection however also think that she may be served better while she has these wounds by compression wrapping versus continuing to use the Juxta-Lite for the time being. Especially with the amount of drainage that she is experiencing at this point. No fevers, chills, nausea, or vomiting noted at this time. 05/31/18 on evaluation today patient appears to actually be doing better in regard to her right lateral lower extremity ulcer specifically on the malleolus region. She has been tolerating the antibiotic without complication. With that being  said she still continues to have issues but a little bit of redness although nothing like she what she was experiencing previous. She still continues to pressure to her ankle area she did get the problem on offloading boots unfortunately she will not wear them she states there too uncomfortable and she can't get in and out of the bed. Nonetheless at this point her wounds seem to be continually getting worse which is not what we want I'm getting somewhat concerned about her progress and how things are going to proceed if we do not intervene in some way shape or form. I therefore had a very lengthy conversation today about offloading yet again and even made a specific suggestion for switching her to a memory foam mattress and even gave the information for a specific one that they could look at getting if it was something that they were interested in considering. She does not want to be considered for a hospital bed air mattress although honestly insurance would not  cover it that she does not have any wounds on her trunk. 06/14/18 on evaluation today both wounds over the bilateral lateral malleolus her ulcers appear to be doing better there's no evidence of pressure injury at this point. She did get the foam mattress for her bed and this does seem to have been extremely beneficial for her in my pinion. Her daughter states that she is having difficulty getting out of bed because of how soft it is. The patient also relates this to be. Nonetheless I do feel like she's actually doing better. Unfortunately right after and around the time she was getting the mattress she also sustained a fall when she got up to go pick up the phone and ended up injuring her right elbow she has 18 sutures in place. We are not caring for this currently although home health is going to be taking the sutures out shortly. Nonetheless this may be something that we need to evaluate going forward. It depends on how well it has or has not healed in the end. She also recently saw an orthopedic specialist for an injection in the right shoulder just before her fall unfortunately the fall seems to have worsened her pain. 06/21/18 on evaluation today patient appears to be doing about the same in regard to her lateral malleolus ulcers. Both appear to be just a little bit deeper but again we are clinging away the necrotic and dead tissue which I think is why this is progressing towards a deeper realm as opposed to improving from my measurement standpoint in that regard. Nonetheless she has been tolerating the dressing changes she absolutely hates the memory foam mattress topper that was obtained for her nonetheless I do believe this is still doing excellent as far as taking care of excess pressure in regard to the lateral malleolus regions. She in fact has no pressure injury that I see whereas in weeks past it was week by week I was constantly seeing new pressure injuries. Overall I think it has been  very beneficial for her. 07/03/18; patient arrives in my clinic today. She has deep punched out areas over her bilateral lateral malleoli. The area on the right has some more depth. [name], [name] ([id number]) We spent a lot of time today talking about pressure relief for these areas. This started when her daughter asked for a prescription for a memory foam mattress. I have never written a prescription for a mattress and I don't think insurances would pay  for that on an ordinary bed. In any case he came up that she has foam boots that she refuses to wear. I would suggest going to these before any other offloading issues when she is in bed. They say she is meticulous about offloading this the rest of the day 07/10/18- She is seen in follow-up evaluation for bilateral, lateral malleolus ulcers. There is no improvement in the ulcers. She has purchased and is sleeping on a memory foam mattress/overlay, she has been using the offloading boots nightly over the past week. She has a follow up appointment with vascular medicine at the end of October, in my opinion this follow up should be expedited given her deterioration and suboptimal TBI results. We will order plain film xray of the left ankle as deeper structures are palpable; would consider having MRI, regardless of xray report(s). The ulcers will be treated with iodoflex/iodosorb, she is unable to safely change the dressings daily with santyl. 07/19/18 on evaluation today patient appears to be doing in general visually well in regard to her bilateral lateral malleolus ulcers. She has been tolerating the dressing changes without complication which is good news. With that being said we did have an x-ray performed on 07/12/18 which revealed a slight loosen see in the lateral portion of the distal left fibula which may represent artifact but underline lytic destruction or osteomyelitis could not be excluded. MRI was recommended. With that being said we can  see about getting the patient scheduled for an MRI to further evaluate this area. In fact we have that scheduled currently for August 20 19,019. 07/26/18 on evaluation today patient's wound on the right lateral ankle actually appears to be doing fairly well at this point in my pinion. She has made some good progress currently. With that being said unfortunately in regard to the left lateral ankle ulcer this seems to be a little bit more problematic at this time. In fact as I further evaluated the situation she actually had bone exposed which is the first time that's been the case in the bone appear to be necrotic. Currently I did review patient's note from Dr. Bunnie Domino office with [facility]. He stated that ABI was 1.26 on the right and 0.95 on the left with good waveforms. Her perfusion is stable not reduced from previous studies and her digital waveforms were pretty good particularly on the right. His conclusion upon review of the note was that there was not much she could do to improve her perfusion and he felt she was adequate for wound healing. His suggestion was that she continued to see [place] and consider a synthetic skin graft if there was no underlying infection. He plans to see her back in six months or as needed. 08/01/18 on evaluation today patient appears to be doing better in regard to her right lateral ankle ulcer. Her left lateral ankle ulcer is about the same she still has bone involvement in evidence of necrosis. There does not appear to be evidence of infection at this time On the right lateral lower extremity. I have started her on the Augmentin she picked this up and started this yesterday. This is to get her through until she sees infectious disease which is scheduled for 08/12/18. 08/06/18 on evaluation today patient appears to be doing rather well considering my discussion with patient's daughter at the end of last week. The area which was marked where she had  erythema seems to be improved and this is good  news. With that being said overall the patient seems to be making good improvement when it comes to the overall appearance of the right lateral ankle ulcer although this has been slow she at least is coming around in this regard. Unfortunately in regard to the left lateral ankle ulcer this is osteomyelitis based on the pathology report as well is bone culture. Nonetheless we are still waiting CT scan. Unfortunately the MRI we originally ordered cannot be performed as the patient is a pacemaker which I had overlooked. Nonetheless we are working on the CT scan approval and scheduling as of now. She did go to the hospital over the weekend and was placed on IV Cefzo for a couple of days. Fortunately this seems to have improved the erythema quite significantly which is good news. There does not appear to be any evidence of worsening infection at this time. She did have some bleeding after the last debridement therefore I did not perform any sharp debridement in regard to left lateral ankle at this point. Patient has been approved for a snap vac for the right lateral ankle. 08/14/18; the patient with wounds over her bilateral lateral malleoli. The area on the right actually looks quite good. Been using a snap back on this area. Healthy granulation and appears to be filling in. Unfortunately the area on the left is really problematic. 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 Also noted to have cellulitis. She saw Dr. Novella Olive of infectious disease today and was put on linezolid. We are able to verify this with her pharmacy. She is completed the Augmentin that she was already on. We've been using Iodoflex to this area 08/23/18 on evaluation today patient's wounds both actually appear to be doing better compared to my prior evaluations. Fortunately she showing signs of good  improvement in regard to the overall wound status especially where were using the snap vac on the right. In regard to left lateral malleolus the wound bed actually appears to be much cleaner than previously noted. I do not feel any phone directly probed during evaluation today and though there is tendon noted this does not appear Guterrez, [name] ([id number]) to be necrotic it's actually fairly good as far as the overall appearance of the tendon is concerned. In general the wound bed actually appears to be doing significantly better than it was previous. Patient is currently in the care of [name] and I did review that note today. He actually has her on two weeks of linezolid and then following the patient will be on 1-2 months of Keflex. That is the plan currently. She has been on antibiotics therapy as prescribed by myself initially starting on July 30, 2018 and has been on that continuously up to this point. 08/30/18 on evaluation today patient actually appears to be doing much better in regard to her right lateral malleolus ulcer. She has been tolerating the dressing changes specifically the snap vac without complication although she did have some issues with the seal currently. Apparently there was some trouble with getting it to maintain over the past week past Sunday. Nonetheless overall the wound appears better in regard to the right lateral malleolus region. In regard to left lateral malleolus this actually show some signs of additional granulation although there still tendon noted in the base of the wound this appears to be healthy not necrotic in any way whatsoever. We are considering potentially  using a snap vac for the left lateral malleolus as well the product wrap from [employer], [place], was present in the clinic today we're going to see this patient I did have her come in with me after obtaining consent from the patient and her daughter in order to look at the wound and see if there's  any recommendation one way or another as to whether or not they felt the snapback could be beneficial for the left lateral malleolus region. But the conclusion was that it might be but that this is definitely a little bit deeper wound than what traditionally would be utilized for a snap vac. 09/06/18 on evaluation today patient actually appears to be doing excellent in my pinion in regard to both ankle ulcers. She has been tolerating the dressing changes without complication which is great news. Specifically we have been using the snap vac. In regard to the right ankle I'm not even sure that this is going to be necessary for today and following as the wound has filled in quite nicely. In regard to the left ankle I do believe that we're seeing excellent epithelialization from the edge as well as granulation in the central portion the tendon is still exposed but there's no evidence of necrotic bone and in general I feel like the patient has made excellent progress even compared to last week with just one week of the snap vac. Electronic Signature(s) Signed: 09/09/2018 1:32:29 PM By: Worthy Keeler PA-C Entered By: Worthy Keeler on 09/09/2018 13:25:39 [name], [name] ([id number]) -------------------------------------------------------------------------------- Physical Exam Details Patient Name: [name] Date of Service: 09/06/2018 11:00 AM Medical Record Number: [id number] Patient Account Number: [id number] Date of Birth/Sex: 1923/11/21 (84 y.o. F) Treating RN: Montey Hora Primary Care Provider: Grayland Ormond Other Clinician: Referring Provider: Grayland Ormond Treating Provider/Extender: STONE III, HOYT Weeks in Treatment: 42 Constitutional Well-nourished and well-hydrated in no acute distress. Respiratory normal breathing without difficulty. clear to auscultation bilaterally. Cardiovascular regular rate and rhythm with normal S1, S2. Psychiatric this patient is able to  make decisions and demonstrates good insight into disease process. Alert and Oriented x 3. pleasant and cooperative. Notes Patient's wounds are both showing signs of improvement with good granulation noted. No sharp debridement was necessary at either location which was good news today. In general I'm very pleased with how things seem to be progressing. Electronic Signature(s) Signed: 09/09/2018 1:32:29 PM By: Worthy Keeler PA-C Entered By: Worthy Keeler on 09/09/2018 13:26:09 [name] ([id number]) -------------------------------------------------------------------------------- Physician Orders Details Patient Name: [name] Date of Service: 09/06/2018 11:00 AM Medical Record Number: [id number] Patient Account Number: [id number] Date of Birth/Sex: 12-28-1922 (84 y.o. 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 o Cleanse wound with mild soap and water o May Shower, gently pat wound dry prior to applying new dressing. Wound #2 Left,Lateral Malleolus o Clean wound with Normal Saline. o Cleanse wound with mild soap and  water o May Shower, gently pat wound dry prior to applying new dressing. Anesthetic (add to Medication List) Wound #1 Right,Lateral Malleolus o Topical  Lidocaine 4% cream applied to wound bed prior to debridement (In Clinic Only). Wound #2 Left,Lateral Malleolus o Topical Lidocaine 4% cream applied to wound bed prior to debridement (In Clinic Only). Primary Wound Dressing Wound #1 Right,Lateral Malleolus o Silver Collagen Wound #2 Left,Lateral Malleolus o Mepitel One Contact layer - over tendon o Silver Collagen - packed into tunnel Secondary Dressing Wound #1 Right,Lateral Malleolus o Boardered Foam Dressing o Drawtex JOJO, GEVING ([id number]) Dressing Change Frequency Wound #1 Right,Lateral Malleolus o Change Dressing Monday, Wednesday, Friday Wound #2 Left,Lateral Malleolus o Change dressing every week Follow-up Appointments Wound #1 Right,Lateral Malleolus o Return Appointment in 1 week. - Wednesday 09/11/18 Wound #2 Left,Lateral Malleolus o Return Appointment in 1 week. - Wednesday 09/11/18 Edema Control Wound #1 Right,Lateral Malleolus o Patient to wear own Velcro compression garment. o Elevate legs to the level of the heart and pump ankles as often as possible Wound #2 Left,Lateral Malleolus o Patient to wear own Velcro compression garment. o Elevate legs to the level of the heart and pump ankles as often as possible Off-Loading Wound #1 Right,Lateral Malleolus o Mattress o Turn and reposition every 2 hours o Other: - do not put pressure on your right ankle Wound #2 Left,Lateral Malleolus o Mattress o Turn and reposition every 2 hours o Other: - do not put pressure on your right ankle Additional Orders / Instructions Wound #1 Right,Lateral Malleolus o Increase protein intake. Wound #2 Left,Lateral Malleolus o Increase protein intake. Home Health Wound #1 [place] Visits - [place] Nurse may visit PRN to address patientos wound care needs. o FACE TO FACE ENCOUNTER: MEDICARE and MEDICAID PATIENTS: I certify  that this patient is under my care and that I had a face-to-face encounter that meets the physician face-to-face encounter requirements with this patient on this date. The encounter with the patient was in whole or in part for the following MEDICAL CONDITION: (primary reason for [place]) MEDICAL NECESSITY: I certify, that based on my findings, NURSING services are a medically necessary home health service. HOME BOUND STATUS: I certify that my clinical findings support that this patient is homebound (i.e., Due to illness or injury, pt requires aid of supportive devices such as crutches, cane, wheelchairs, walkers, the use of special transportation or the assistance of another person to leave their place of residence. There is a normal inability to leave the home and doing so requires considerable and taxing effort. Other absences are for medical reasons / religious services and are infrequent or of short duration when for other reasons). AAMINA, SKIFF ([id number]) o If current dressing causes regression in wound condition, may D/C ordered dressing product/s and apply Normal Saline Moist Dressing daily until next [place] / Other MD appointment. [place] of regression in wound condition at [phone]. o Please direct any NON-WOUND related issues/requests for orders to patient's Primary Care Physician Wound #2 [place] Visits - [place] Nurse may visit PRN to address patientos wound care needs. o FACE TO FACE ENCOUNTER: MEDICARE and MEDICAID PATIENTS: I certify that this patient is under my care and that I had a face-to-face encounter that meets the physician face-to-face encounter requirements with this patient on this date. The encounter with the patient was in whole or  in part for the following MEDICAL CONDITION: (primary reason for Home Healthcare) MEDICAL NECESSITY: I certify, that based  on my findings, NURSING services are a medically necessary home health service. HOME BOUND STATUS: I certify that my clinical findings support that this patient is homebound (i.e., Due to illness or injury, pt requires aid of supportive devices such as crutches, cane, wheelchairs, walkers, the use of special transportation or the assistance of another person to leave their place of residence. There is a normal inability to leave the home and doing so requires considerable and taxing effort. Other absences are for medical reasons / religious services and are infrequent or of short duration when for other reasons). o If current dressing causes regression in wound condition, may D/C ordered dressing product/s and apply Normal Saline Moist Dressing daily until next [place] / Other MD appointment. Portal of regression in wound condition at [phone]. o Please direct any NON-WOUND related issues/requests for orders to patient's Primary Care Physician Negative Pressure Wound Therapy Wound #2 Left,Lateral Malleolus o Other: - SNAP VAC Electronic Signature(s) Signed: 09/06/2018 5:05:51 PM By: Montey Hora Signed: 09/09/2018 1:32:29 PM By: Worthy Keeler PA-C Entered By: Montey Hora on 09/06/2018 12:14:15 [name], [name] ([id number]) -------------------------------------------------------------------------------- Problem List Details Patient Name: [name] Date of Service: 09/06/2018 11:00 AM Medical Record Number: [id number] Patient Account Number: [id number] Date of Birth/Sex: 07/06/1923 (84 y.o. F) Treating RN: Montey Hora Primary Care Provider: Grayland Ormond Other Clinician: Referring Provider: Grayland Ormond Treating Provider/Extender: Melburn Hake, HOYT Weeks in Treatment: 36 Active Problems ICD-10 Evaluated Encounter Code Description Active Date Today Diagnosis 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, stage 4 05/10/2018 No  Yes L89.524 Pressure ulcer of left ankle, stage 4 04/25/2018 No Yes I70.233 Atherosclerosis of native arteries of right leg with ulceration of 08/10/2017 No Yes ankle I70.243 Atherosclerosis of native arteries of left leg with ulceration of 07/10/2018 No Yes ankle I89.0 Lymphedema, not elsewhere classified 08/10/2017 No Yes B35.4 Tinea corporis 09/28/2017 No Yes 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 F) Treating RN: Montey Hora Primary Care Provider: Grayland Ormond Other Clinician: Referring Provider: Grayland Ormond Treating Provider/Extender: Melburn Hake, HOYT Weeks in Treatment: 74 Subjective Chief Complaint Information obtained from Patient Patient is here for right lateral malleolus and left lateral malleolus ulcer History of Present Illness (HPI) 84 year old patient who most recently has been seeing both podiatry and vascular surgery for a long-standing ulcer of her right lateral malleolus which has been treated with various methodologies. Dr. Amalia Hailey the podiatrist saw her on 07/20/2017 and sent her to the wound center for possible hyperbaric oxygen therapy. past medical history of peripheral vascular disease, varicose veins, status post appendectomy, basal cell carcinoma excision from the left leg, cholecystectomy, pacemaker placement, right lower extremity angiography done by Dr. dew in March 2017 with placement of a stent. there is also note of a successful ablation of  the right small saphenous vein done which was reviewed by ultrasound on 10/24/2016. the patient had a right small saphenous vein ablation done on 10/20/2016. The patient has never been a smoker. She has been seen by Dr. Corene Cornea  dew the vascular surgeon who most recently saw her on 06/15/2017 for evaluation of ongoing problems with right leg swelling. She had a lower extremity arterial duplex examination done(02/13/17) which showed patent distal right superficial femoral artery stent and above-the-knee popliteal stent without evidence of restenosis. The ABI was more than 1.3 on the right and more than 1.3 on the left. This was consistent with noncompressible arteries due to medial calcification. The right great toe pressure and PPG waveforms are within normal limits and the left great toe pressure and PPG waveforms are decreased. he recommended she continue to wear her compression stockings and continue with elevation. She is scheduled to have a noninvasive arterial study in the near future 08/16/2017 -- had a lower extremity arterial duplex examination done which showed patent distal right superficial femoral artery stent and above-the-knee popliteal stent without evidence of restenosis. The ABI was more than 1.3 on the right and more than 1.3 on the left. This was consistent with noncompressible arteries due to medial calcification. The right great toe pressure and PPG waveforms are within normal limits and the left great toe pressure and PPG waveforms are decreased. the x-ray of the right ankle has not yet been done 08/24/2017 -- had a right ankle x-ray -- IMPRESSION:1. No fracture, bone lesion or evidence of osteomyelitis. 2. Lateral soft tissue swelling with a soft tissue ulcer. she has not yet seen the vascular surgeon for review 08/31/17 on evaluation today patient's wound appears to be showing signs of improvement. She still with her appointment with vascular in order to review her results of  her vascular study and then determine if any intervention would be recommended at that time. No fevers, chills, nausea, or vomiting noted at this time. She has been tolerating the dressing changes without complication. 09/28/17 on evaluation today patient's wound appears to show signs of good improvement in regard to the granulation tissue which is surfacing. There is still a layer of slough covering the wound and the posterior portion is still significantly deeper than the anterior nonetheless there has been some good sign of things moving towards the better. She is going to go back to Dr. dew for reevaluation to ensure her blood flow is still appropriate. That will be before her next evaluation with [place] next week. No fevers, chills, nausea, or vomiting noted at this time. Patient does have some discomfort rated to be a 3-4/10 depending on activity specifically cleansing the wound makes it worse. 10/05/2017 -- the patient was seen by Dr. Lucky Cowboy last week and noninvasive studies showed a normal right ABI with brisk [name], [name]. ([id number]) triphasic waveforms consistent with no arterial insufficiency including normal digital pressures. The duplex showed a patent distal right SFA stent and the proximal SFA was also normal. He was pleased with her test and thought she should have enough of perfusion for normal wound healing. He would see her back in 6 months time. 12/21/17 on evaluation today patient appears to be doing fairly well in regard to her right lateral ankle wound. Unfortunately the main issue that she is expansion at this point is that she is having some issues with what appears to be some cellulitis in the right anterior shin. She has also been noting a little bit of uncomfortable feeling especially last night and her ankle area. I'm afraid that she made the developing a little bit of an infection. With that being said I think it is in the early stages. 12/28/17 on evaluation today  patient's  ankle appears to be doing excellent. She's making good progress at this point the cellulitis seems to have improved after last week's evaluation. Overall she is having no significant discomfort which is excellent news. She does have an appointment with Dr. dew on March 29, 2018 for reevaluation in regard to the stent he placed. She seems to have excellent blood flow in the right lower extremity. 01/19/12 on evaluation today patient's wound appears to be doing very well. In fact she does not appear to require debridement at this point, there's no evidence of infection, and overall from the standpoint of the wound she seems to be doing very well. With that being said I believe that it may be time to switch to different dressing away from the [facility] Dressing she tells me she does have a lot going on her friend actually passed away yesterday and she's also having a lot of issues with her husband this obviously is weighing heavy on her as far as your thoughts and concerns today. 01/25/18 on evaluation today patient appears to be doing fairly well in regard to her right lateral malleolus. She has been tolerating the dressing changes without complication. Overall I feel like this is definitely showing signs of improvement as far as how the overall appearance of the wound is there's also evidence of epithelium start to migrate over the granulation tissue. In general I think that she is progressing nicely as far as the wound is concerned. The only concern she really has is whether or not we can switch to every other week visits in order to avoid having as many appointments as her daughters have a difficult time getting her to her appointments as well as the patient's husband to his he is not doing very well at this point. 02/22/18 on evaluation today patient's right lateral malleolus ulcer appears to be doing great. She has been tolerating the dressing changes without complication. Overall you  making excellent progress at this time. Patient is having no significant discomfort. 03/15/18 on evaluation today patient appears to be doing much more poorly in regard to her right lateral ankle ulcer at this point. Unfortunately since have last seen her her husband has passed just a few days ago is obviously weighed heavily on her her daughter also had surgery well she is with her today as usual. There does not appear to be any evidence of infection she does seem to have significant contusion/deep tissue injury to the right lateral malleolus which was not noted previous when I saw her last. It's hard to tell of exactly when this injury occurred although during the time she was spending the night in the hospital this may have been most likely. 03/22/18 on evaluation today patient appears to actually be doing very well in regard to her ulcer. She did unfortunately have a setback which was noted last week however the good news is we seem to be getting back on track and in fact the wound in the core did still have some necrotic tissue which will be addressed at this point today but in general I'm seeing signs that things are on the up and up. She is glad to hear this obviously she's been somewhat concerned that due to the how her wound digressed more recently. 03/29/18 on evaluation today patient appears to be doing fairly well in regard to her right lower extremity lateral malleolus ulcer. She unfortunately does have a new area of pressure injury over the inferior portion where the wound  has opened up a little bit larger secondary to the pressure she seems to be getting. She does tell me sometimes when she sleeps at night that it actually hurts and does seem to be pushing on the area little bit more unfortunately. There does not appear to be any evidence of infection which is good news. She has been tolerating the dressing changes without complication. She also did have some bruising in the left second  and third toes due to the fact that she may have bump this or injured it although she has neuropathy so she does not feel she did move recently that may have been where this came from. Nonetheless there does not appear to be any evidence of infection at this time. 04/12/18 on evaluation today patient's wound on the right lateral ankle actually appears to be doing a little bit better with a lot of necrotic docking tissue centrally loosening up in clearing away. However she does have the beginnings of a deep tissue injury on the left lateral malleolus likely due to the fact we've been trying offload the right as much as we have. I think she may benefit from an assistive soft device to help with offloading and it looks like they're looking at one of the doughnut conditions that wraps around the lower leg to offload which I think will definitely do a good job. With that being said I think we definitely need to address this issue on the left before it becomes a wound. Patient is not having significant pain. [name], [name] ([id number]) 04/19/18 on evaluation today patient appears to be doing excellent in regard to the progress she's made with her right lateral ankle ulcer. The left ankle region which did show evidence of a deep tissue injury seems to be resolving there's little fluid noted underneath and a blister there's nothing open at this point in time overall I feel like this is progressing nicely which is good news. She does not seem to be having significant discomfort at this point which is also good news. 04/25/18-She is here in follow up evaluation for bilateral lateral malleolar ulcers. The right lateral malleolus ulcer with pale subcutaneous tissue exposure, central area of ulcer with tendon/periosteum exposed. The left lateral malleolus ulcer now with central area of nonviable tissue, otherwise deep tissue injury. She is wearing compression wraps to the left lower extremity, she will place  the right lower extremity compression wraps on when she gets home. She will be out of town over the weekend and return next week and follow-up appointment. She completed her doxycycline this morning 05/03/18 on evaluation today patient appears to be doing very well in regard to her right lateral ankle ulcer in general. At least she's showing some signs of improvement in this regard. Unfortunately she has some additional injury to the left lateral malleolus region which appears to be new likely even over the past several days. Again this determination is based on the overall appearance. With that being said the patient is obviously frustrated about this currently. 05/10/18-She is here in follow-up evaluation for bilateral lateral malleolar ulcers. She states she has purchased offloading shoes/boots and they will arrive tomorrow. She was asked to bring them in the office at next week's appointment so her provider is aware of product being utilized. She continues to sleep on right or left side, she has been encouraged to sleep on her back. The right lateral malleolus ulcer is precariously close to peri-osteum; will order xray. The left lateral  malleolus ulcer is improved. Will switch back to santyl; she will follow up next week. 05/17/18 on evaluation today patient actually appears to be doing very well in regard to her malleolus her ulcers compared to last time I saw them. She does not seem to have as much in the way of contusion at this point which is great news. With that being said she does continue to have discomfort and I do believe that she is still continuing to benefit from the offloading/pressure reducing boots that were recommended. I think this is the key to trying to get this to heal up completely. 05/24/18 on evaluation today patient actually appears to be doing worse at this point in time unfortunately compared to her last week's evaluation. She is having really no increased pain which is good  news unfortunately she does have more maceration in your theme and noted surrounding the right lateral ankle the left lateral ankle is not really is erythematous I do not see signs of the overt cellulitis on that side. Unfortunately the wounds do not seem to have shown any signs of improvement since the last evaluation. She also has significant swelling especially on the right compared to previous some of this may be due to infection however also think that she may be served better while she has these wounds by compression wrapping versus continuing to use the Juxta-Lite for the time being. Especially with the amount of drainage that she is experiencing at this point. No fevers, chills, nausea, or vomiting noted at this time. 05/31/18 on evaluation today patient appears to actually be doing better in regard to her right lateral lower extremity ulcer specifically on the malleolus region. She has been tolerating the antibiotic without complication. With that being said she still continues to have issues but a little bit of redness although nothing like she what she was experiencing previous. She still continues to pressure to her ankle area she did get the problem on offloading boots unfortunately she will not wear them she states there too uncomfortable and she can't get in and out of the bed. Nonetheless at this point her wounds seem to be continually getting worse which is not what we want I'm getting somewhat concerned about her progress and how things are going to proceed if we do not intervene in some way shape or form. I therefore had a very lengthy conversation today about offloading yet again and even made a specific suggestion for switching her to a memory foam mattress and even gave the information for a specific one that they could look at getting if it was something that they were interested in considering. She does not want to be considered for a hospital bed air mattress although honestly  insurance would not cover it that she does not have any wounds on her trunk. 06/14/18 on evaluation today both wounds over the bilateral lateral malleolus her ulcers appear to be doing better there's no evidence of pressure injury at this point. She did get the foam mattress for her bed and this does seem to have been extremely beneficial for her in my pinion. Her daughter states that she is having difficulty getting out of bed because of how soft it is. The patient also relates this to be. Nonetheless I do feel like she's actually doing better. Unfortunately right after and around the time she was getting the mattress she also sustained a fall when she got up to go pick up the phone and ended up injuring  her right elbow she has 18 sutures in place. We are not caring for this currently although home health is going to be taking the sutures out shortly. Nonetheless this may be something that we need to evaluate going forward. It depends on how well it has or has not healed in the end. She also recently saw an orthopedic specialist for an injection in the right shoulder just before her fall unfortunately the fall seems to have worsened her pain. 06/21/18 on evaluation today patient appears to be doing about the same in regard to her lateral malleolus ulcers. Both appear to be just a little bit deeper but again we are clinging away the necrotic and dead tissue which I think is why this is progressing towards a deeper realm as opposed to improving from my measurement standpoint in that regard. Nonetheless she has been tolerating the dressing changes she absolutely hates the memory foam mattress topper that was obtained for her nonetheless Brie, Eppard [name] ([id number]) I do believe this is still doing excellent as far as taking care of excess pressure in regard to the lateral malleolus regions. She in fact has no pressure injury that I see whereas in weeks past it was week by week I was constantly seeing  new pressure injuries. Overall I think it has been very beneficial for her. 07/03/18; patient arrives in my clinic today. She has deep punched out areas over her bilateral lateral malleoli. The area on the right has some more depth. We spent a lot of time today talking about pressure relief for these areas. This started when her daughter asked for a prescription for a memory foam mattress. I have never written a prescription for a mattress and I don't think insurances would pay for that on an ordinary bed. In any case he came up that she has foam boots that she refuses to wear. I would suggest going to these before any other offloading issues when she is in bed. They say she is meticulous about offloading this the rest of the day 07/10/18- She is seen in follow-up evaluation for bilateral, lateral malleolus ulcers. There is no improvement in the ulcers. She has purchased and is sleeping on a memory foam mattress/overlay, she has been using the offloading boots nightly over the past week. She has a follow up appointment with vascular medicine at the end of October, in my opinion this follow up should be expedited given her deterioration and suboptimal TBI results. We will order plain film xray of the left ankle as deeper structures are palpable; would consider having MRI, regardless of xray report(s). The ulcers will be treated with iodoflex/iodosorb, she is unable to safely change the dressings daily with santyl. 07/19/18 on evaluation today patient appears to be doing in general visually well in regard to her bilateral lateral malleolus ulcers. She has been tolerating the dressing changes without complication which is good news. With that being said we did have an x-ray performed on 07/12/18 which revealed a slight loosen see in the lateral portion of the distal left fibula which may represent artifact but underline lytic destruction or osteomyelitis could not be excluded. MRI was recommended. With  that being said we can see about getting the patient scheduled for an MRI to further evaluate this area. In fact we have that scheduled currently for August 20 19,019. 07/26/18 on evaluation today patient's wound on the right lateral ankle actually appears to be doing fairly well at this point in my pinion. She has  made some good progress currently. With that being said unfortunately in regard to the left lateral ankle ulcer this seems to be a little bit more problematic at this time. In fact as I further evaluated the situation she actually had bone exposed which is the first time that's been the case in the bone appear to be necrotic. Currently I did review patient's note from Dr. Bunnie Domino office with [facility]. He stated that ABI was 1.26 on the right and 0.95 on the left with good waveforms. Her perfusion is stable not reduced from previous studies and her digital waveforms were pretty good particularly on the right. His conclusion upon review of the note was that there was not much she could do to improve her perfusion and he felt she was adequate for wound healing. His suggestion was that she continued to see [place] and consider a synthetic skin graft if there was no underlying infection. He plans to see her back in six months or as needed. 08/01/18 on evaluation today patient appears to be doing better in regard to her right lateral ankle ulcer. Her left lateral ankle ulcer is about the same she still has bone involvement in evidence of necrosis. There does not appear to be evidence of infection at this time On the right lateral lower extremity. I have started her on the Augmentin she picked this up and started this yesterday. This is to get her through until she sees infectious disease which is scheduled for 08/12/18. 08/06/18 on evaluation today patient appears to be doing rather well considering my discussion with patient's daughter at the end of last week. The area which was  marked where she had erythema seems to be improved and this is good news. With that being said overall the patient seems to be making good improvement when it comes to the overall appearance of the right lateral ankle ulcer although this has been slow she at least is coming around in this regard. Unfortunately in regard to the left lateral ankle ulcer this is osteomyelitis based on the pathology report as well is bone culture. Nonetheless we are still waiting CT scan. Unfortunately the MRI we originally ordered cannot be performed as the patient is a pacemaker which I had overlooked. Nonetheless we are working on the CT scan approval and scheduling as of now. She did go to the hospital over the weekend and was placed on IV Cefzo for a couple of days. Fortunately this seems to have improved the erythema quite significantly which is good news. There does not appear to be any evidence of worsening infection at this time. She did have some bleeding after the last debridement therefore I did not perform any sharp debridement in regard to left lateral ankle at this point. Patient has been approved for a snap vac for the right lateral ankle. 08/14/18; the patient with wounds over her bilateral lateral malleoli. The area on the right actually looks quite good. Been using a snap back on this area. Healthy granulation and appears to be filling in. Unfortunately the area on the left is really problematic. 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 Also noted to have cellulitis. She saw Dr. Novella Olive of infectious disease today and was put on linezolid. We are able to verify this with her pharmacy. She is completed the Augmentin that she was already on. We've been using Iodoflex to this [name], [name] Amato  G. ([id number]) area 08/23/18 on evaluation today patient's wounds both actually appear to be doing better compared to my prior  evaluations. Fortunately she showing signs of good improvement in regard to the overall wound status especially where were using the snap vac on the right. In regard to left lateral malleolus the wound bed actually appears to be much cleaner than previously noted. I do not feel any phone directly probed during evaluation today and though there is tendon noted this does not appear to be necrotic it's actually fairly good as far as the overall appearance of the tendon is concerned. In general the wound bed actually appears to be doing significantly better than it was previous. Patient is currently in the care of [name] and I did review that note today. He actually has her on two weeks of linezolid and then following the patient will be on 1-2 months of Keflex. That is the plan currently. She has been on antibiotics therapy as prescribed by myself initially starting on July 30, 2018 and has been on that continuously up to this point. 08/30/18 on evaluation today patient actually appears to be doing much better in regard to her right lateral malleolus ulcer. She has been tolerating the dressing changes specifically the snap vac without complication although she did have some issues with the seal currently. Apparently there was some trouble with getting it to maintain over the past week past Sunday. Nonetheless overall the wound appears better in regard to the right lateral malleolus region. In regard to left lateral malleolus this actually show some signs of additional granulation although there still tendon noted in the base of the wound this appears to be healthy not necrotic in any way whatsoever. We are considering potentially using a snap vac for the left lateral malleolus as well the product wrap from [employer], Cassandra, was present in the clinic today we're going to see this patient I did have her come in with me after obtaining consent from the patient and her daughter in order to look at the wound  and see if there's any recommendation one way or another as to whether or not they felt the snapback could be beneficial for the left lateral malleolus region. But the conclusion was that it might be but that this is definitely a little bit deeper wound than what traditionally would be utilized for a snap vac. 09/06/18 on evaluation today patient actually appears to be doing excellent in my pinion in regard to both ankle ulcers. She has been tolerating the dressing changes without complication which is great news. Specifically we have been using the snap vac. In regard to the right ankle I'm not even sure that this is going to be necessary for today and following as the wound has filled in quite nicely. In regard to the left ankle I do believe that we're seeing excellent epithelialization from the edge as well as granulation in the central portion the tendon is still exposed but there's no evidence of necrotic bone and in general I feel like the patient has made excellent progress even compared to last week with just one week of the snap vac. Patient History Information obtained from Patient. Family History Cancer - Father,Siblings, Heart Disease - Siblings, No family history of Diabetes, Hereditary Spherocytosis, Hypertension, Kidney Disease, Lung Disease, Seizures, Stroke, Thyroid Problems, Tuberculosis. Social History Never smoker, Marital Status - Married, Alcohol Use - Never, Drug Use - No History, Caffeine Use - Rarely. Review of Systems (ROS) Constitutional  Symptoms (General Health) Denies complaints or symptoms of Fever, Chills. Respiratory The patient has no complaints or symptoms. Cardiovascular The patient has no complaints or symptoms. Psychiatric The patient has no complaints or symptoms. ROMONA, MURDY ([id number]) Objective Constitutional Well-nourished and well-hydrated in no acute distress. Vitals Time Taken: 11:15 AM, Height: 65 in, Weight: 154.3 lbs, BMI: 25.7,  Temperature: 98.1 F, Pulse: 70 bpm, Respiratory Rate: 18 breaths/min, Blood Pressure: 108/50 mmHg. Respiratory normal breathing without difficulty. clear to auscultation bilaterally. Cardiovascular regular rate and rhythm with normal S1, S2. Psychiatric this patient is able to make decisions and demonstrates good insight into disease process. Alert and Oriented x 3. pleasant and cooperative. General Notes: Patient's wounds are both showing signs of improvement with good granulation noted. No sharp debridement was necessary at either location which was good news today. In general I'm very pleased with how things seem to be progressing. Integumentary (Hair, Skin) Wound #1 status is Open. Original cause of wound was Gradually Appeared. The wound is located on the Right,Lateral Malleolus. The wound measures 1.2cm length x 1.4cm width x 0.3cm depth; 1.319cm^2 area and 0.396cm^3 volume. There is Fat Layer (Subcutaneous Tissue) Exposed exposed. There is no tunneling or undermining noted. There is a medium amount of serosanguineous drainage noted. The wound margin is distinct with the outline attached to the wound base. There is small (1-33%) pink granulation within the wound bed. There is a small (1-33%) amount of necrotic tissue within the wound bed including Adherent Slough. The periwound skin appearance exhibited: Maceration, Rubor. The periwound skin appearance did not exhibit: Callus, Crepitus, Excoriation, Induration, Rash, Scarring, Dry/Scaly, Atrophie Blanche, Cyanosis, Ecchymosis, Hemosiderin Staining, Mottled, Pallor, Erythema. Periwound temperature was noted as No Abnormality. The periwound has tenderness on palpation. Wound #2 status is Open. Original cause of wound was Gradually Appeared. The wound is located on the Left,Lateral Malleolus. The wound measures 1.6cm length x 1.4cm width x 1cm depth; 1.759cm^2 area and 1.759cm^3 volume. There is Fat Layer (Subcutaneous Tissue) Exposed  exposed. There is no tunneling or undermining noted. There is a large amount of serosanguineous drainage noted. The wound margin is flat and intact. There is small (1-33%) pink granulation within the wound bed. There is a large (67-100%) amount of necrotic tissue within the wound bed including Eschar and Adherent Slough. The periwound skin appearance exhibited: Maceration. The periwound skin appearance did not exhibit: Callus, Crepitus, Excoriation, Induration, Rash, Scarring, Dry/Scaly, Atrophie Blanche, Cyanosis, Ecchymosis, Hemosiderin Staining, Mottled, Pallor, Rubor, Erythema. Periwound temperature was noted as No Abnormality. The periwound has tenderness on palpation. 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 Cleanse wound with mild soap and water May Shower, gently pat wound dry prior to applying new dressing. Wound #2 Left,Lateral Malleolus: Clean wound with Normal Saline. Cleanse wound with mild soap and water May Shower, gently pat wound dry prior to applying new dressing. Anesthetic (add to Medication List): Wound #1 Right,Lateral Malleolus: Topical Lidocaine 4% cream applied to wound bed prior to debridement (In Clinic Only). Wound #2 Left,Lateral Malleolus: Topical Lidocaine 4% cream applied to wound bed prior to debridement (In Clinic Only). Primary Wound Dressing: Wound #1 Right,Lateral  Malleolus: Silver Collagen Wound #2 Left,Lateral Malleolus: Mepitel One Contact layer - over tendon Silver Collagen - packed into tunnel Secondary  Dressing: Wound #1 Right,Lateral Malleolus: Boardered Foam Dressing Drawtex Dressing Change Frequency: Wound #1 Right,Lateral Malleolus: Change Dressing Monday, Wednesday, Friday Wound #2 Left,Lateral Malleolus: Change dressing every week Follow-up Appointments: Wound #1 Right,Lateral Malleolus: Return Appointment in 1 week. - Wednesday 09/11/18 Wound #2 Left,Lateral Malleolus: Return Appointment in 1 week. - Wednesday 09/11/18 Edema Control: Wound #1 Right,Lateral Malleolus: Patient to wear own Velcro compression garment. Elevate legs to the level of the heart and pump ankles as often as possible Wound #2 Left,Lateral Malleolus: Patient to wear own Velcro compression garment. Elevate legs to the level of the heart and pump ankles as often as possible Off-Loading: Wound #1 Right,Lateral Malleolus: Mattress Turn and reposition every 2 hours Other: - do not put pressure on your right ankle Watson, [name] ([id number]) Wound #2 Left,Lateral Malleolus: Mattress Turn and reposition every 2 hours Other: - do not put pressure on your right ankle Additional Orders / Instructions: Wound #1 Right,Lateral Malleolus: Increase protein intake. Wound #2 Left,Lateral Malleolus: Increase protein intake. Home Health: Wound #1 Right,Lateral Malleolus: [place] Visits - [place] Nurse may visit PRN to address patient s wound care needs. FACE TO FACE ENCOUNTER: MEDICARE and MEDICAID PATIENTS: I certify that this patient is under my care and that I had a face-to-face encounter that meets the physician face-to-face encounter requirements with this patient on this date. The encounter with the patient was in whole or in part for the following MEDICAL CONDITION: (primary reason for [place]) MEDICAL NECESSITY: I certify, that based on my findings, NURSING services are a medically necessary home health service. HOME BOUND STATUS: I certify that my clinical findings  support that this patient is homebound (i.e., Due to illness or injury, pt requires aid of supportive devices such as crutches, cane, wheelchairs, walkers, the use of special transportation or the assistance of another person to leave their place of residence. There is a normal inability to leave the home and doing so requires considerable and taxing effort. Other absences are for medical reasons / religious services and are infrequent or of short duration when for other reasons). If current dressing causes regression in wound condition, may D/C ordered dressing product/s and apply Normal Saline Moist Dressing daily until next [place] / Other MD appointment. [place] of regression in wound condition at [phone]. Please direct any NON-WOUND related issues/requests for orders to patient's Primary Care Physician Wound #2 Left,Lateral Malleolus: [place] Visits - [place] Nurse may visit PRN to address patient s wound care needs. FACE TO FACE ENCOUNTER: MEDICARE and MEDICAID PATIENTS: I certify that this patient is under my care and that I had a face-to-face encounter that meets the physician face-to-face encounter requirements with this patient on this date. The encounter with the patient was in whole or in part for the following MEDICAL CONDITION: (primary reason for [employer]) MEDICAL NECESSITY: I certify, that based on my findings, NURSING services are a medically necessary home health service. HOME BOUND STATUS: I certify that my clinical findings support that this patient is homebound (i.e., Due to illness or injury, pt requires aid of supportive devices such as crutches, cane, wheelchairs, walkers, the use of special transportation or the assistance of another person to leave their place of residence. There is a normal inability to leave the home and doing so requires  considerable and taxing effort. Other absences are for medical  reasons / religious services and are infrequent or of short duration when for other reasons). If current dressing causes regression in wound condition, may D/C ordered dressing product/s and apply Normal Saline Moist Dressing daily until next [place] / Other MD appointment. [place] of regression in wound condition at [phone]. Please direct any NON-WOUND related issues/requests for orders to patient's Primary Care Physician Negative Pressure Wound Therapy: Wound #2 Left,Lateral Malleolus: Other: - SNAP VAC Currently patient has been experiencing excellent success with the snap vac. With that being said I do not believe that's gonna be necessary at this point in regard to the right lateral malleolus. We will switch to using the Prisma along with a Boarder Foam Dressing at this time. In regard to left lateral malleolus I'm gonna continue with the snap vac we just need to be sure to use the Prisma in the base of the wound and then subsequently cover the tendon with mepitel to prevent this from becoming necrotic. The patient is in agreement with the plan. We will see her back for reevaluation in one week she will see Dr. Dellia Nims since I'm not here at that time that I will see her back in two weeks time. Please see above for specific wound care orders. We will see patient for re-evaluation in 1 week(s) here in the clinic. If anything worsens or changes patient will contact our office for additional recommendations. ADRIENNE, TROMBETTA ([id number]) Electronic Signature(s) Signed: 09/09/2018 1:32:29 PM By: Worthy Keeler PA-C Entered By: Worthy Keeler on 09/09/2018 13:26:23 [name] ([id number]) -------------------------------------------------------------------------------- ROS/PFSH Details Patient Name: [name] Date of Service: 09/06/2018 11:00 AM Medical Record Number: [id number] Patient Account Number: [id number] Date of Birth/Sex:  July 02, 1923 (84 y.o. F) Treating RN: Montey Hora Primary Care Provider: Grayland Ormond Other Clinician: Referring Provider: Grayland Ormond Treating Provider/Extender: Melburn Hake, HOYT Weeks in Treatment: 33 Information Obtained From Patient Wound History Do you currently have one or more open woundso Yes How many open wounds do you currently haveo 1 Approximately how long have you had your woundso 2 yrs How have you been treating your wound(s) until nowo mupirocin, soaking in epsom salt Has your wound(s) ever healed and then re-openedo No Have you had any lab work done in the past montho No Have you tested positive for an antibiotic resistant organism (MRSA, VRE)o No Have you tested positive for osteomyelitis (bone infection)o No Have you had any tests for circulation on your legso Yes Who ordered the testo Dr. Lucky Cowboy [facility] Constitutional Symptoms (General Health) Complaints and Symptoms: Negative for: Fever; Chills Eyes Medical History: Positive for: Cataracts - surgery Respiratory Complaints and Symptoms: No Complaints or Symptoms Cardiovascular Complaints and Symptoms: No Complaints or Symptoms Medical History: Positive for: Congestive Heart Failure; Hypertension Musculoskeletal Medical History: Positive for: Osteoarthritis Neurologic Medical History: Positive for: Neuropathy Oncologic AMBERLYN, MARTINEZGARCIA ([id number]) Medical History: Negative for: Received Chemotherapy; Received Radiation Psychiatric Complaints and Symptoms: No Complaints or Symptoms HBO Extended History Items Eyes: Cataracts Immunizations Pneumococcal Vaccine: Received Pneumococcal Vaccination: Yes Implantable Devices Family and Social History Cancer: Yes - Father,Siblings; Diabetes: No; Heart Disease: Yes - Siblings; Hereditary Spherocytosis: No; Hypertension: No; Kidney Disease: No; Lung Disease: No; Seizures: No; Stroke: No; Thyroid Problems: No; Tuberculosis: No;  Never smoker; Marital Status - Married; Alcohol Use: Never; Drug Use: No History; Caffeine Use: Rarely; Financial Concerns: No; Food, Clothing or  Shelter Needs: No; Support System Lacking: No; Transportation Concerns: No; Advanced Directives: No; Patient does not want information on Advanced Directives; Do not resuscitate: No; Living Will: Yes (Not Provided); Medical Power of Attorney: No Physician Affirmation I have reviewed and agree with the above information. Electronic Signature(s) Signed: 09/09/2018 1:32:29 PM By: Worthy Keeler PA-C Signed: 09/09/2018 4:37:12 PM By: Montey Hora Entered By: Worthy Keeler on 09/09/2018 13:25:56 Durnin, [name] ([id number]) -------------------------------------------------------------------------------- SuperBill Details Patient Name: [name] Date of Service: 09/06/2018 Medical Record Number: [id number] Patient Account Number: [id number] Date of Birth/Sex: [birth year] (84 y.o. 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

## 2018-09-13 ENCOUNTER — Encounter: Payer: Medicare Other | Admitting: Physician Assistant

## 2018-09-14 NOTE — Progress Notes (Signed)
[name], [name] ([id number]) Visit Report for 09/11/2018 HPI Details Patient Name: [name], [name]. Date [name] Service: 09/11/2018 10:15 AM Medical Record Number: [id number] Patient Account Number: [id number] Date [name] Birth/Sex: May 21, 1923 (84 y.o. F) Treating RN: Cornell Barman Primary Care Provider: Grayland Ormond Other Clinician: Referring Provider: Grayland Ormond Treating Provider/Extender: Tito Dine in Treatment: 105 History [name] Present Illness HPI Description: 84 year old patient who most recently has been seeing both podiatry and vascular surgery for a long- standing ulcer [name] her right lateral malleolus which has been treated with various methodologies. Dr. Amalia Hailey the podiatrist saw her on 07/20/2017 and sent her to the wound center for possible hyperbaric oxygen therapy. past medical history [name] peripheral vascular disease, varicose veins, status post appendectomy, basal cell carcinoma excision from the left leg, cholecystectomy, pacemaker placement, right lower extremity angiography done by Dr. dew in March 2017 with placement [name] a stent. there is also note [name] a successful ablation [name] the right small saphenous vein done which was reviewed by ultrasound on 10/24/2016. the patient had a right small saphenous vein ablation done on 10/20/2016. The patient has never been a smoker. She has been seen by Dr. Corene Cornea dew the vascular surgeon who most recently saw her on 06/15/2017 for evaluation [name] ongoing problems with right leg swelling. She had a lower extremity arterial duplex examination done(02/13/17) which showed patent distal right superficial femoral artery stent and above-the-knee popliteal stent without evidence [name] restenosis. The ABI was more than 1.3 on the right and more than 1.3 on the left. This was consistent with noncompressible arteries due to medial calcification. The right great toe pressure and PPG waveforms are within normal limits and the left great toe pressure  and PPG waveforms are decreased. he recommended she continue to wear her compression stockings and continue with elevation. She is scheduled to have a noninvasive arterial study in the near future 08/16/2017 -- had a lower extremity arterial duplex examination done which showed patent distal right superficial femoral artery stent and above-the-knee popliteal stent without evidence [name] restenosis. The ABI was more than 1.3 on the right and more than 1.3 on the left. This was consistent with noncompressible arteries due to medial calcification. The right great toe pressure and PPG waveforms are within normal limits and the left great toe pressure and PPG waveforms are decreased. the x-ray [name] the right ankle has not yet been done 08/24/2017 -- had a right ankle x-ray -- IMPRESSION:1. No fracture, bone lesion or evidence [name] osteomyelitis. 2. Lateral soft tissue swelling with a soft tissue ulcer. she has not yet seen the vascular surgeon for review 08/31/17 on evaluation today patient's wound appears to be showing signs [name] improvement. She still with her appointment with vascular in order to review her results [name] her vascular study and then determine if any intervention would be recommended at that time. No fevers, chills, nausea, or vomiting noted at this time. She has been tolerating the dressing changes without complication. 09/28/17 on evaluation today patient's wound appears to show signs [name] good improvement in regard to the granulation tissue which is surfacing. There is still a layer [name] slough covering the wound and the posterior portion is still significantly deeper than the anterior nonetheless there has been some good sign [name] things moving towards the better. She is going to go back to Dr. dew for reevaluation to ensure her blood flow is still appropriate. That will be before her next evaluation with [place] next week. No fevers, chills, nausea,  or vomiting noted at this time. Patient does have  some discomfort rated to be a 3-4/10 depending on activity specifically cleansing the wound makes it worse. 10/05/2017 -- the patient was seen by Dr. Lucky Cowboy last week and noninvasive studies showed a normal right ABI with brisk triphasic waveforms consistent with no arterial insufficiency including normal digital pressures. The duplex showed a patent distal right SFA stent and the proximal SFA was also normal. He was pleased with her test and thought she should have enough [name] perfusion for normal wound healing. He would see her back in 6 months time. [name], [name] ([id number]) 12/21/17 on evaluation today patient appears to be doing fairly well in regard to her right lateral ankle wound. Unfortunately the main issue that she is expansion at this point is that she is having some issues with what appears to be some cellulitis in the right anterior shin. She has also been noting a little bit [name] uncomfortable feeling especially last night and her ankle area. I'm afraid that she made the developing a little bit [name] an infection. With that being said I think it is in the early stages. 12/28/17 on evaluation today patient's ankle appears to be doing excellent. She's making good progress at this point the cellulitis seems to have improved after last week's evaluation. Overall she is having no significant discomfort which is excellent news. She does have an appointment with Dr. dew on March 29, 2018 for reevaluation in regard to the stent he placed. She seems to have excellent blood flow in the right lower extremity. 01/19/12 on evaluation today patient's wound appears to be doing very well. In fact she does not appear to require debridement at this point, there's no evidence [name] infection, and overall from the standpoint [name] the wound she seems to be doing very well. With that being said I believe that it may be time to switch to different dressing away from the [facility] Dressing she tells me she does  have a lot going on her friend actually passed away yesterday and she's also having a lot [name] issues with her husband this obviously is weighing heavy on her as far as your thoughts and concerns today. 01/25/18 on evaluation today patient appears to be doing fairly well in regard to her right lateral malleolus. She has been tolerating the dressing changes without complication. Overall I feel like this is definitely showing signs [name] improvement as far as how the overall appearance [name] the wound is there's also evidence [name] epithelium start to migrate over the granulation tissue. In general I think that she is progressing nicely as far as the wound is concerned. The only concern she really has is whether or not we can switch to every other week visits in order to avoid having as many appointments as her daughters have a difficult time getting her to her appointments as well as the patient's husband to his he is not doing very well at this point. 02/22/18 on evaluation today patient's right lateral malleolus ulcer appears to be doing great. She has been tolerating the dressing changes without complication. Overall you making excellent progress at this time. Patient is having no significant discomfort. 03/15/18 on evaluation today patient appears to be doing much more poorly in regard to her right lateral ankle ulcer at this point. Unfortunately since have last seen her her husband has passed just a few days ago is obviously weighed heavily on her her daughter also had surgery well she is  with her today as usual. There does not appear to be any evidence [name] infection she does seem to have significant contusion/deep tissue injury to the right lateral malleolus which was not noted previous when I saw her last. It's hard to tell [name] exactly when this injury occurred although during the time she was spending the night in the hospital this may have been most likely. 03/22/18 on evaluation today patient appears to  actually be doing very well in regard to her ulcer. She did unfortunately have a setback which was noted last week however the good news is we seem to be getting back on track and in fact the wound in the core did still have some necrotic tissue which will be addressed at this point today but in general I'm seeing signs that things are on the up and up. She is glad to hear this obviously she's been somewhat concerned that due to the how her wound digressed more recently. 03/29/18 on evaluation today patient appears to be doing fairly well in regard to her right lower extremity lateral malleolus ulcer. She unfortunately does have a new area [name] pressure injury over the inferior portion where the wound has opened up a little bit larger secondary to the pressure she seems to be getting. She does tell me sometimes when she sleeps at night that it actually hurts and does seem to be pushing on the area little bit more unfortunately. There does not appear to be any evidence [name] infection which is good news. She has been tolerating the dressing changes without complication. She also did have some bruising in the left second and third toes due to the fact that she may have bump this or injured it although she has neuropathy so she does not feel she did move recently that may have been where this came from. Nonetheless there does not appear to be any evidence [name] infection at this time. 04/12/18 on evaluation today patient's wound on the right lateral ankle actually appears to be doing a little bit better with a lot [name] necrotic docking tissue centrally loosening up in clearing away. However she does have the beginnings [name] a deep tissue injury on the left lateral malleolus likely due to the fact we've been trying offload the right as much as we have. I think she may benefit from an assistive soft device to help with offloading and it looks like they're looking at one [name] the doughnut conditions that wraps around the  lower leg to offload which I think will definitely do a good job. With that being said I think we definitely need to address this issue on the left before it becomes a wound. Patient is not having significant pain. 04/19/18 on evaluation today patient appears to be doing excellent in regard to the progress she's made with her right lateral ankle ulcer. The left ankle region which did show evidence [name] a deep tissue injury seems to be resolving there's little fluid noted underneath and a blister there's nothing open at this point in time overall I feel like this is progressing nicely which is [name], [name]. ([id number]) good news. She does not seem to be having significant discomfort at this point which is also good news. 04/25/18-She is here in follow up evaluation for bilateral lateral malleolar ulcers. The right lateral malleolus ulcer with pale subcutaneous tissue exposure, central area [name] ulcer with tendon/periosteum exposed. The left lateral malleolus ulcer now with central area [name] nonviable tissue, otherwise deep tissue  injury. She is wearing compression wraps to the left lower extremity, she will place the right lower extremity compression wraps on when she gets home. She will be out [name] town over the weekend and return next week and follow-up appointment. She completed her doxycycline this morning 05/03/18 on evaluation today patient appears to be doing very well in regard to her right lateral ankle ulcer in general. At least she's showing some signs [name] improvement in this regard. Unfortunately she has some additional injury to the left lateral malleolus region which appears to be new likely even over the past several days. Again this determination is based on the overall appearance. With that being said the patient is obviously frustrated about this currently. 05/10/18-She is here in follow-up evaluation for bilateral lateral malleolar ulcers. She states she has purchased  offloading shoes/boots and they will arrive tomorrow. She was asked to bring them in the office at next week's appointment so her provider is aware [name] product being utilized. She continues to sleep on right or left side, she has been encouraged to sleep on her back. The right lateral malleolus ulcer is precariously close to peri-osteum; will order xray. The left lateral malleolus ulcer is improved. Will switch back to santyl; she will follow up next week. 05/17/18 on evaluation today patient actually appears to be doing very well in regard to her malleolus her ulcers compared to last time I saw them. She does not seem to have as much in the way [name] contusion at this point which is great news. With that being said she does continue to have discomfort and I do believe that she is still continuing to benefit from the offloading/pressure reducing boots that were recommended. I think this is the key to trying to get this to heal up completely. 05/24/18 on evaluation today patient actually appears to be doing worse at this point in time unfortunately compared to her last week's evaluation. She is having really no increased pain which is good news unfortunately she does have more maceration in your theme and noted surrounding the right lateral ankle the left lateral ankle is not really is erythematous I do not see signs [name] the overt cellulitis on that side. Unfortunately the wounds do not seem to have shown any signs [name] improvement since the last evaluation. She also has significant swelling especially on the right compared to previous some [name] this may be due to infection however also think that she may be served better while she has these wounds by compression wrapping versus continuing to use the Juxta-Lite for the time being. Especially with the amount [name] drainage that she is experiencing at this point. No fevers, chills, nausea, or vomiting noted at this time. 05/31/18 on evaluation today patient appears  to actually be doing better in regard to her right lateral lower extremity ulcer specifically on the malleolus region. She has been tolerating the antibiotic without complication. With that being said she still continues to have issues but a little bit [name] redness although nothing like she what she was experiencing previous. She still continues to pressure to her ankle area she did get the problem on offloading boots unfortunately she will not wear them she states there too uncomfortable and she can't get in and out [name] the bed. Nonetheless at this point her wounds seem to be continually getting worse which is not what we want I'm getting somewhat concerned about her progress and how things are going to proceed if we do not  intervene in some way shape or form. I therefore had a very lengthy conversation today about offloading yet again and even made a specific suggestion for switching her to a memory foam mattress and even gave the information for a specific one that they could look at getting if it was something that they were interested in considering. She does not want to be considered for a hospital bed air mattress although honestly insurance would not cover it that she does not have any wounds on her trunk. 06/14/18 on evaluation today both wounds over the bilateral lateral malleolus her ulcers appear to be doing better there's no evidence [name] pressure injury at this point. She did get the foam mattress for her bed and this does seem to have been extremely beneficial for her in my pinion. Her daughter states that she is having difficulty getting out [name] bed because [name] how soft it is. The patient also relates this to be. Nonetheless I do feel like she's actually doing better. Unfortunately right after and around the time she was getting the mattress she also sustained a fall when she got up to go pick up the phone and ended up injuring her right elbow she has 18 sutures in place. We are not caring  for this currently although home health is going to be taking the sutures out shortly. Nonetheless this may be something that we need to evaluate going forward. It depends on how well it has or has not healed in the end. She also recently saw an orthopedic specialist for an injection in the right shoulder just before her fall unfortunately the fall seems to have worsened her pain. 06/21/18 on evaluation today patient appears to be doing about the same in regard to her lateral malleolus ulcers. Both appear to be just a little bit deeper but again we are clinging away the necrotic and dead tissue which I think is why this is progressing towards a deeper realm as opposed to improving from my measurement standpoint in that regard. Nonetheless she has been tolerating the dressing changes she absolutely hates the memory foam mattress topper that was obtained for her nonetheless I do believe this is still doing excellent as far as taking care [name] excess pressure in regard to the lateral malleolus regions. She in fact has no pressure injury that I see whereas in weeks past it was week by week I was constantly seeing new pressure injuries. Overall I think it has been very beneficial for her. [name], [name] ([id number]) 07/03/18; patient arrives in my clinic today. She has deep punched out areas over her bilateral lateral malleoli. The area on the right has some more depth. We spent a lot [name] time today talking about pressure relief for these areas. This started when her daughter asked for a prescription for a memory foam mattress. I have never written a prescription for a mattress and I don't think insurances would pay for that on an ordinary bed. In any case he came up that she has foam boots that she refuses to wear. I would suggest going to these before any other offloading issues when she is in bed. They say she is meticulous about offloading this the rest [name] the day 07/10/18- She is seen in follow-up  evaluation for bilateral, lateral malleolus ulcers. There is no improvement in the ulcers. She has purchased and is sleeping on a memory foam mattress/overlay, she has been using the offloading boots nightly over the past week. She has a  follow up appointment with vascular medicine at the end [name] October, in my opinion this follow up should be expedited given her deterioration and suboptimal TBI results. We will order plain film xray [name] the left ankle as deeper structures are palpable; would consider having MRI, regardless [name] xray report(s). The ulcers will be treated with iodoflex/iodosorb, she is unable to safely change the dressings daily with santyl. 07/19/18 on evaluation today patient appears to be doing in general visually well in regard to her bilateral lateral malleolus ulcers. She has been tolerating the dressing changes without complication which is good news. With that being said we did have an x-ray performed on 07/12/18 which revealed a slight loosen see in the lateral portion [name] the distal left fibula which may represent artifact but underline lytic destruction or osteomyelitis could not be excluded. MRI was recommended. With that being said we can see about getting the patient scheduled for an MRI to further evaluate this area. In fact we have that scheduled currently for August 20 19,019. 07/26/18 on evaluation today patient's wound on the right lateral ankle actually appears to be doing fairly well at this point in my pinion. She has made some good progress currently. With that being said unfortunately in regard to the left lateral ankle ulcer this seems to be a little bit more problematic at this time. In fact as I further evaluated the situation she actually had bone exposed which is the first time that's been the case in the bone appear to be necrotic. Currently I did review patient's note from Dr. Bunnie Domino office with [facility]. He stated that ABI was 1.26 on  the right and 0.95 on the left with good waveforms. Her perfusion is stable not reduced from previous studies and her digital waveforms were pretty good particularly on the right. His conclusion upon review [name] the note was that there was not much she could do to improve her perfusion and he felt she was adequate for wound healing. His suggestion was that she continued to see [place] and consider a synthetic skin graft if there was no underlying infection. He plans to see her back in six months or as needed. 08/01/18 on evaluation today patient appears to be doing better in regard to her right lateral ankle ulcer. Her left lateral ankle ulcer is about the same she still has bone involvement in evidence [name] necrosis. There does not appear to be evidence [name] infection at this time On the right lateral lower extremity. I have started her on the Augmentin she picked this up and started this yesterday. This is to get her through until she sees infectious disease which is scheduled for 08/12/18. 08/06/18 on evaluation today patient appears to be doing rather well considering my discussion with patient's daughter at the end [name] last week. The area which was marked where she had erythema seems to be improved and this is good news. With that being said overall the patient seems to be making good improvement when it comes to the overall appearance [name] the right lateral ankle ulcer although this has been slow she at least is coming around in this regard. Unfortunately in regard to the left lateral ankle ulcer this is osteomyelitis based on the pathology report as well is bone culture. Nonetheless we are still waiting CT scan. Unfortunately the MRI we originally ordered cannot be performed as the patient is a pacemaker which I had overlooked. Nonetheless we are working on the CT scan  approval and scheduling as [name] now. She did go to the hospital over the weekend and was placed on IV Cefzo for a couple [name] days. Fortunately this  seems to have improved the erythema quite significantly which is good news. There does not appear to be any evidence [name] worsening infection at this time. She did have some bleeding after the last debridement therefore I did not perform any sharp debridement in regard to left lateral ankle at this point. Patient has been approved for a snap vac for the right lateral ankle. 08/14/18; the patient with wounds over her bilateral lateral malleoli. The area on the right actually looks quite good. Been using a snap back on this area. Healthy granulation and appears to be filling in. Unfortunately the area on the left is really problematic. She had a recent CT scan on 08/13/18 that showed findings consistent with osteomyelitis [name] the lateral malleolus on the left. Also noted to have cellulitis. She saw Dr. Novella Olive [name] infectious disease today and was put on linezolid. We are able to verify this with her pharmacy. She is completed the Augmentin that she was already on. We've been using Iodoflex to this area 08/23/18 on evaluation today patient's wounds both actually appear to be doing better compared to my prior evaluations. [name], [name] ([id number]) Fortunately she showing signs [name] good improvement in regard to the overall wound status especially where were using the snap vac on the right. In regard to left lateral malleolus the wound bed actually appears to be much cleaner than previously noted. I do not feel any phone directly probed during evaluation today and though there is tendon noted this does not appear to be necrotic it's actually fairly good as far as the overall appearance [name] the tendon is concerned. In general the wound bed actually appears to be doing significantly better than it was previous. Patient is currently in the care [name] [name] and I did review that note today. He actually has her on two weeks [name] linezolid and then following the patient will be on 1-2 months [name] Keflex. That is the  plan currently. She has been on antibiotics therapy as prescribed by myself initially starting on July 30, 2018 and has been on that continuously up to this point. 08/30/18 on evaluation today patient actually appears to be doing much better in regard to her right lateral malleolus ulcer. She has been tolerating the dressing changes specifically the snap vac without complication although she did have some issues with the seal currently. Apparently there was some trouble with getting it to maintain over the past week past Sunday. Nonetheless overall the wound appears better in regard to the right lateral malleolus region. In regard to left lateral malleolus this actually show some signs [name] additional granulation although there still tendon noted in the base [name] the wound this appears to be healthy not necrotic in any way whatsoever. We are considering potentially using a snap vac for the left lateral malleolus as well the product wrap from [employer], [place], was present in the clinic today we're going to see this patient I did have her come in with me after obtaining consent from the patient and her daughter in order to look at the wound and see if there's any recommendation one way or another as to whether or not they felt the snapback could be beneficial for the left lateral malleolus region. But the conclusion was that it might be but that this is definitely a little  bit deeper wound than what traditionally would be utilized for a snap vac. 09/06/18 on evaluation today patient actually appears to be doing excellent in my pinion in regard to both ankle ulcers. She has been tolerating the dressing changes without complication which is great news. Specifically we have been using the snap vac. In regard to the right ankle I'm not even sure that this is going to be necessary for today and following as the wound has filled in quite nicely. In regard to the left ankle I do believe that we're seeing excellent  epithelialization from the edge as well as granulation in the central portion the tendon is still exposed but there's no evidence [name] necrotic bone and in general I feel like the patient has made excellent progress even compared to last week with just one week [name] the snap vac. 09/11/18; this is a patient who has wounds on her bilateral lateral malleoli. Initially both [name] these were deep stage IV wounds in the setting [name] chronic arterial insufficiency. She has been revascularized. As I understand think she been using snap vacs to both [name] these wounds however the area on the right became more superficial and currently she is only using it on the left. Using silver collagen on the right and silver collagen under the back on the left I believe Electronic Signature(s) Signed: 09/11/2018 6:14:59 PM By: Linton Ham MD Entered By: Linton Ham on 09/11/2018 11:35:44 Pelster, [name] ([id number]) -------------------------------------------------------------------------------- Physical Exam Details Patient Name: [name] Date [name] Service: 09/11/2018 10:15 AM Medical Record Number: [id number] Patient Account Number: [id number] Date [name] Birth/Sex: 1923-06-17 (84 y.o. F) Treating RN: Cornell Barman Primary Care Provider: Grayland Ormond Other Clinician: Referring Provider: Grayland Ormond Treating Provider/Extender: Tito Dine in Treatment: 42 Constitutional Sitting or standing Blood Pressure is within target range for patient.. Pulse regular and within target range for patient.Marland Kitchen Respirations regular, non-labored and within target range.. Temperature is normal and within the target range for the patient.Marland Kitchen appears in no distress. Eyes Conjunctivae clear. No discharge. Respiratory Respiratory effort is easy and symmetric bilaterally. Rate is normal at rest and on room air.. Cardiovascular Pedal pulses absent bilaterally.. She has a fair amount [name] edema in the left leg greater  than the right however I think the band holding the back in place on the upper left calf was probably too tight. She was advised to loosen this somewhat. Lymphatic None palpable in the popliteal area bilaterally. Psychiatric No evidence [name] depression, anxiety, or agitation. Calm, cooperative, and communicative. Appropriate interactions and affect.. Notes Wound exam; both [name] these actually look excellent. The right is superficial with healthy granulation. On the left there is still some depth although the base [name] this looks very healthy. No debridement is required in either area. Electronic Signature(s) Signed: 09/11/2018 6:14:59 PM By: Linton Ham MD Entered By: Linton Ham on 09/11/2018 11:37:30 [name], [name] ([id number]) -------------------------------------------------------------------------------- Physician Orders Details Patient Name: [name] Date [name] Service: 09/11/2018 10:15 AM Medical Record Number: [id number] Patient Account Number: [id number] Date [name] Birth/Sex: 1923/02/02 (84 y.o. F) Treating RN: Cornell Barman Primary Care Provider: Grayland Ormond Other Clinician: Referring Provider: Grayland Ormond Treating Provider/Extender: Tito Dine in Treatment: 72 Verbal / Phone Orders: No Diagnosis Coding Wound Cleansing Wound #1 Right,Lateral Malleolus o Clean wound with Normal Saline. o Cleanse wound with mild soap and water o May Shower, gently pat wound dry prior to applying new dressing. Wound #2 Left,Lateral Malleolus o Clean wound with  Normal Saline. o Cleanse wound with mild soap and water o May Shower, gently pat wound dry prior to applying new dressing. Anesthetic (add to Medication List) Wound #1 Right,Lateral Malleolus o Topical Lidocaine 4% cream applied to wound bed prior to debridement (In Clinic Only). Wound #2 Left,Lateral Malleolus o Topical Lidocaine 4% cream applied to wound bed prior to debridement (In Clinic  Only). Primary Wound Dressing Wound #1 Right,Lateral Malleolus o Silver Collagen Wound #2 Left,Lateral Malleolus o Silver Collagen Secondary Dressing Wound #1 Right,Lateral Malleolus o Boardered Foam Dressing o Drawtex Dressing Change Frequency Wound #1 Right,Lateral Malleolus o Change Dressing Monday, Wednesday, Friday Wound #2 Left,Lateral Malleolus o Change Dressing Monday, Wednesday, Friday Follow-up Appointments Wound #1 Right,Lateral Malleolus o Return Appointment in 1 week. Wound #2 Left,Lateral Malleolus o Return Appointment in 1 week. [name], [name] ([id number]) Edema Control Wound #1 Right,Lateral Malleolus o Patient to wear own compression stockings o Patient to wear own Velcro compression garment. o Elevate legs to the level [name] the heart and pump ankles as often as possible Wound #2 Left,Lateral Malleolus o Patient to wear own compression stockings o Patient to wear own Velcro compression garment. o Elevate legs to the level [name] the heart and pump ankles as often as possible Off-Loading Wound #1 Right,Lateral Malleolus o Turn and reposition every 2 hours o Other: - do not put pressure on your ankles Wound #2 Left,Lateral Malleolus o Turn and reposition every 2 hours o Other: - do not put pressure on your ankles Additional Orders / Instructions Wound #1 Right,Lateral Malleolus o Increase protein intake. Wound #2 Left,Lateral Malleolus o Increase protein intake. Home Health Wound #1 [place] Visits - [place] Nurse may visit PRN to address patientos wound care needs. o FACE TO FACE ENCOUNTER: MEDICARE and MEDICAID PATIENTS: I certify that this patient is under my care and that I had a face-to-face encounter that meets the physician face-to-face encounter requirements with this patient on this date. The encounter with the patient was in whole or in part for the  following MEDICAL CONDITION: (primary reason for [employer] [name] [employer]) MEDICAL NECESSITY: I certify, that based on my findings, NURSING services are a medically necessary home health service. HOME BOUND STATUS: I certify that my clinical findings support that this patient is homebound (i.e., Due to illness or injury, pt requires aid [name] supportive devices such as crutches, cane, wheelchairs, walkers, the use [name] special transportation or the assistance [name] another person to leave their place [name] residence. There is a normal inability to leave the home and doing so requires considerable and taxing effort. Other absences are for medical reasons / religious services and are infrequent or [name] short duration when for other reasons). o If current dressing causes regression in wound condition, may D/C ordered dressing product/s and apply Normal Saline Moist Dressing daily until next [place] / Other MD appointment. [name] regression in wound condition at [phone]. o Please direct any NON-WOUND related issues/requests for orders to patient's Primary Care Physician Wound #2 [place] Visits - [place] Nurse may visit PRN to address patientos wound care needs. o FACE TO FACE ENCOUNTER: MEDICARE and MEDICAID PATIENTS: I certify that this patient is under my care and that I had a face-to-face encounter that meets the physician face-to-face encounter requirements with this patient on this date. The encounter with the patient was in whole or in part for the  following MEDICAL CONDITION: (primary reason for Home Healthcare) MEDICAL NECESSITY: I certify, that based on my findings, NURSING services are a medically necessary home health service. HOME BOUND STATUS: I certify that my clinical findings support that this patient is homebound (i.e., Due to illness or injury, pt requires aid [name] RANGANATHAN, LAIYAH EXLINE  ([id number]) supportive devices such as crutches, cane, wheelchairs, walkers, the use [name] special transportation or the assistance [name] another person to leave their place [name] residence. There is a normal inability to leave the home and doing so requires considerable and taxing effort. Other absences are for medical reasons / religious services and are infrequent or [name] short duration when for other reasons). o If current dressing causes regression in wound condition, may D/C ordered dressing product/s and apply Normal Saline Moist Dressing daily until next [place] / Other MD appointment. [place] [name] regression in wound condition at [phone]. o Please direct any NON-WOUND related issues/requests for orders to patient's Primary Care Physician Negative Pressure Wound Therapy Wound #2 Left,Lateral Malleolus o Other: - SNAP VAC Electronic Signature(s) Signed: 09/11/2018 5:34:51 PM By: Gretta Cool, BSN, RN, CWS, Kim RN, BSN Signed: 09/11/2018 6:14:59 PM By: Linton Ham MD Entered By: Gretta Cool, BSN, RN, CWS, Kim on 09/11/2018 10:55:54 [name], [name] ([id number]) -------------------------------------------------------------------------------- Problem List Details Patient Name: [name], [name] Date [name] Service: 09/11/2018 10:15 AM Medical Record Number: [id number] Patient Account Number: [id number] Date [name] Birth/Sex: 1923-06-09 (84 y.o. F) Treating RN: Cornell Barman Primary Care Provider: Grayland Ormond Other Clinician: Referring Provider: Grayland Ormond Treating Provider/Extender: Tito Dine in Treatment: 62 Active Problems ICD-10 Evaluated Encounter Code Description Active Date Today Diagnosis L89.514 Pressure ulcer [name] right ankle, stage 4 05/10/2018 No Yes L89.524 Pressure ulcer [name] left ankle, stage 4 04/25/2018 No Yes I70.233 Atherosclerosis [name] native arteries [name] right leg with ulceration [name] 08/10/2017 No Yes ankle I70.243 Atherosclerosis [name]  native arteries [name] left leg with ulceration [name] 07/10/2018 No Yes ankle I89.0 Lymphedema, not elsewhere classified 08/10/2017 No Yes B35.4 Tinea corporis 09/28/2017 No Yes 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 F) Treating RN: Cornell Barman Primary Care Provider: Grayland Ormond Other Clinician: Referring Provider: Grayland Ormond Treating Provider/Extender: Tito Dine in Treatment: 69 Subjective History [name] Present Illness (HPI) 84 year old patient who most recently has been seeing both podiatry and vascular surgery for a long-standing ulcer [name] her right lateral malleolus which has been treated with various methodologies. Dr. Amalia Hailey the podiatrist saw her on 07/20/2017 and sent her to the wound center for possible hyperbaric oxygen therapy. past medical history [name] peripheral vascular disease, varicose veins, status post appendectomy, basal cell carcinoma excision from the left leg, cholecystectomy, pacemaker placement, right lower extremity angiography done by Dr. dew in March 2017 with placement [name] a stent. there is also note [name] a successful ablation [name] the right small saphenous vein done which was reviewed by ultrasound on 10/24/2016. the patient had a right small saphenous vein ablation done on 10/20/2016. The patient has never been a smoker. She has been seen by Dr. Corene Cornea dew the vascular surgeon who most recently saw her on 06/15/2017 for evaluation [name]  ongoing problems  with right leg swelling. She had a lower extremity arterial duplex examination done(02/13/17) which showed patent distal right superficial femoral artery stent and above-the-knee popliteal stent without evidence [name] restenosis. The ABI was more than 1.3 on the right and more than 1.3 on the left. This was consistent with noncompressible arteries due to medial calcification. The right great toe pressure and PPG waveforms are within normal limits and the left great toe pressure and PPG waveforms are decreased. he recommended she continue to wear her compression stockings and continue with elevation. She is scheduled to have a noninvasive arterial study in the near future 08/16/2017 -- had a lower extremity arterial duplex examination done which showed patent distal right superficial femoral artery stent and above-the-knee popliteal stent without evidence [name] restenosis. The ABI was more than 1.3 on the right and more than 1.3 on the left. This was consistent with noncompressible arteries due to medial calcification. The right great toe pressure and PPG waveforms are within normal limits and the left great toe pressure and PPG waveforms are decreased. the x-ray [name] the right ankle has not yet been done 08/24/2017 -- had a right ankle x-ray -- IMPRESSION:1. No fracture, bone lesion or evidence [name] osteomyelitis. 2. Lateral soft tissue swelling with a soft tissue ulcer. she has not yet seen the vascular surgeon for review 08/31/17 on evaluation today patient's wound appears to be showing signs [name] improvement. She still with her appointment with vascular in order to review her results [name] her vascular study and then determine if any intervention would be recommended at that time. No fevers, chills, nausea, or vomiting noted at this time. She has been tolerating the dressing changes without complication. 09/28/17 on evaluation today patient's wound appears to show signs [name] good improvement in regard to the  granulation tissue which is surfacing. There is still a layer [name] slough covering the wound and the posterior portion is still significantly deeper than the anterior nonetheless there has been some good sign [name] things moving towards the better. She is going to go back to Dr. dew for reevaluation to ensure her blood flow is still appropriate. That will be before her next evaluation with [place] next week. No fevers, chills, nausea, or vomiting noted at this time. Patient does have some discomfort rated to be a 3-4/10 depending on activity specifically cleansing the wound makes it worse. 10/05/2017 -- the patient was seen by Dr. Lucky Cowboy last week and noninvasive studies showed a normal right ABI with brisk triphasic waveforms consistent with no arterial insufficiency including normal digital pressures. The duplex showed a patent distal right SFA stent and the proximal SFA was also normal. He was pleased with her test and thought she should have enough [name] perfusion for normal wound healing. He would see her back in 6 months time. 12/21/17 on evaluation today patient appears to be doing fairly well in regard to her right lateral ankle wound. Unfortunately the BERNIECE, ABID ([id number]) main issue that she is expansion at this point is that she is having some issues with what appears to be some cellulitis in the right anterior shin. She has also been noting a little bit [name] uncomfortable feeling especially last night and her ankle area. I'm afraid that she made the developing a little bit [name] an infection. With that being said I think it is in the early stages. 12/28/17 on evaluation today patient's ankle appears to be doing excellent. She's making good progress at this point the  cellulitis seems to have improved after last week's evaluation. Overall she is having no significant discomfort which is excellent news. She does have an appointment with Dr. dew on March 29, 2018 for reevaluation in regard to the stent  he placed. She seems to have excellent blood flow in the right lower extremity. 01/19/12 on evaluation today patient's wound appears to be doing very well. In fact she does not appear to require debridement at this point, there's no evidence [name] infection, and overall from the standpoint [name] the wound she seems to be doing very well. With that being said I believe that it may be time to switch to different dressing away from the [facility] Dressing she tells me she does have a lot going on her friend actually passed away yesterday and she's also having a lot [name] issues with her husband this obviously is weighing heavy on her as far as your thoughts and concerns today. 01/25/18 on evaluation today patient appears to be doing fairly well in regard to her right lateral malleolus. She has been tolerating the dressing changes without complication. Overall I feel like this is definitely showing signs [name] improvement as far as how the overall appearance [name] the wound is there's also evidence [name] epithelium start to migrate over the granulation tissue. In general I think that she is progressing nicely as far as the wound is concerned. The only concern she really has is whether or not we can switch to every other week visits in order to avoid having as many appointments as her daughters have a difficult time getting her to her appointments as well as the patient's husband to his he is not doing very well at this point. 02/22/18 on evaluation today patient's right lateral malleolus ulcer appears to be doing great. She has been tolerating the dressing changes without complication. Overall you making excellent progress at this time. Patient is having no significant discomfort. 03/15/18 on evaluation today patient appears to be doing much more poorly in regard to her right lateral ankle ulcer at this point. Unfortunately since have last seen her her husband has passed just a few days ago is obviously weighed heavily  on her her daughter also had surgery well she is with her today as usual. There does not appear to be any evidence [name] infection she does seem to have significant contusion/deep tissue injury to the right lateral malleolus which was not noted previous when I saw her last. It's hard to tell [name] exactly when this injury occurred although during the time she was spending the night in the hospital this may have been most likely. 03/22/18 on evaluation today patient appears to actually be doing very well in regard to her ulcer. She did unfortunately have a setback which was noted last week however the good news is we seem to be getting back on track and in fact the wound in the core did still have some necrotic tissue which will be addressed at this point today but in general I'm seeing signs that things are on the up and up. She is glad to hear this obviously she's been somewhat concerned that due to the how her wound digressed more recently. 03/29/18 on evaluation today patient appears to be doing fairly well in regard to her right lower extremity lateral malleolus ulcer. She unfortunately does have a new area [name] pressure injury over the inferior portion where the wound has opened up a little bit larger secondary to the pressure she seems  to be getting. She does tell me sometimes when she sleeps at night that it actually hurts and does seem to be pushing on the area little bit more unfortunately. There does not appear to be any evidence [name] infection which is good news. She has been tolerating the dressing changes without complication. She also did have some bruising in the left second and third toes due to the fact that she may have bump this or injured it although she has neuropathy so she does not feel she did move recently that may have been where this came from. Nonetheless there does not appear to be any evidence [name] infection at this time. 04/12/18 on evaluation today patient's wound on the right  lateral ankle actually appears to be doing a little bit better with a lot [name] necrotic docking tissue centrally loosening up in clearing away. However she does have the beginnings [name] a deep tissue injury on the left lateral malleolus likely due to the fact we've been trying offload the right as much as we have. I think she may benefit from an assistive soft device to help with offloading and it looks like they're looking at one [name] the doughnut conditions that wraps around the lower leg to offload which I think will definitely do a good job. With that being said I think we definitely need to address this issue on the left before it becomes a wound. Patient is not having significant pain. 04/19/18 on evaluation today patient appears to be doing excellent in regard to the progress she's made with her right lateral ankle ulcer. The left ankle region which did show evidence [name] a deep tissue injury seems to be resolving there's little fluid noted underneath and a blister there's nothing open at this point in time overall I feel like this is progressing nicely which is good news. She does not seem to be having significant discomfort at this point which is also good news. 04/25/18-She is here in follow up evaluation for bilateral lateral malleolar ulcers. The right lateral malleolus ulcer with pale Ghosh, [name] ([id number]) subcutaneous tissue exposure, central area [name] ulcer with tendon/periosteum exposed. The left lateral malleolus ulcer now with central area [name] nonviable tissue, otherwise deep tissue injury. She is wearing compression wraps to the left lower extremity, she will place the right lower extremity compression wraps on when she gets home. She will be out [name] town over the weekend and return next week and follow-up appointment. She completed her doxycycline this morning 05/03/18 on evaluation today patient appears to be doing very well in regard to her right lateral ankle ulcer in general. At  least she's showing some signs [name] improvement in this regard. Unfortunately she has some additional injury to the left lateral malleolus region which appears to be new likely even over the past several days. Again this determination is based on the overall appearance. With that being said the patient is obviously frustrated about this currently. 05/10/18-She is here in follow-up evaluation for bilateral lateral malleolar ulcers. She states she has purchased offloading shoes/boots and they will arrive tomorrow. She was asked to bring them in the office at next week's appointment so her provider is aware [name] product being utilized. She continues to sleep on right or left side, she has been encouraged to sleep on her back. The right lateral malleolus ulcer is precariously close to peri-osteum; will order xray. The left lateral malleolus ulcer is improved. Will switch back to santyl; she will follow up  next week. 05/17/18 on evaluation today patient actually appears to be doing very well in regard to her malleolus her ulcers compared to last time I saw them. She does not seem to have as much in the way [name] contusion at this point which is great news. With that being said she does continue to have discomfort and I do believe that she is still continuing to benefit from the offloading/pressure reducing boots that were recommended. I think this is the key to trying to get this to heal up completely. 05/24/18 on evaluation today patient actually appears to be doing worse at this point in time unfortunately compared to her last week's evaluation. She is having really no increased pain which is good news unfortunately she does have more maceration in your theme and noted surrounding the right lateral ankle the left lateral ankle is not really is erythematous I do not see signs [name] the overt cellulitis on that side. Unfortunately the wounds do not seem to have shown any signs [name] improvement since the last evaluation.  She also has significant swelling especially on the right compared to previous some [name] this may be due to infection however also think that she may be served better while she has these wounds by compression wrapping versus continuing to use the Juxta-Lite for the time being. Especially with the amount [name] drainage that she is experiencing at this point. No fevers, chills, nausea, or vomiting noted at this time. 05/31/18 on evaluation today patient appears to actually be doing better in regard to her right lateral lower extremity ulcer specifically on the malleolus region. She has been tolerating the antibiotic without complication. With that being said she still continues to have issues but a little bit [name] redness although nothing like she what she was experiencing previous. She still continues to pressure to her ankle area she did get the problem on offloading boots unfortunately she will not wear them she states there too uncomfortable and she can't get in and out [name] the bed. Nonetheless at this point her wounds seem to be continually getting worse which is not what we want I'm getting somewhat concerned about her progress and how things are going to proceed if we do not intervene in some way shape or form. I therefore had a very lengthy conversation today about offloading yet again and even made a specific suggestion for switching her to a memory foam mattress and even gave the information for a specific one that they could look at getting if it was something that they were interested in considering. She does not want to be considered for a hospital bed air mattress although honestly insurance would not cover it that she does not have any wounds on her trunk. 06/14/18 on evaluation today both wounds over the bilateral lateral malleolus her ulcers appear to be doing better there's no evidence [name] pressure injury at this point. She did get the foam mattress for her bed and this does seem to have  been extremely beneficial for her in my pinion. Her daughter states that she is having difficulty getting out [name] bed because [name] how soft it is. The patient also relates this to be. Nonetheless I do feel like she's actually doing better. Unfortunately right after and around the time she was getting the mattress she also sustained a fall when she got up to go pick up the phone and ended up injuring her right elbow she has 18 sutures in place. We are not caring  for this currently although home health is going to be taking the sutures out shortly. Nonetheless this may be something that we need to evaluate going forward. It depends on how well it has or has not healed in the end. She also recently saw an orthopedic specialist for an injection in the right shoulder just before her fall unfortunately the fall seems to have worsened her pain. 06/21/18 on evaluation today patient appears to be doing about the same in regard to her lateral malleolus ulcers. Both appear to be just a little bit deeper but again we are clinging away the necrotic and dead tissue which I think is why this is progressing towards a deeper realm as opposed to improving from my measurement standpoint in that regard. Nonetheless she has been tolerating the dressing changes she absolutely hates the memory foam mattress topper that was obtained for her nonetheless I do believe this is still doing excellent as far as taking care [name] excess pressure in regard to the lateral malleolus regions. She in fact has no pressure injury that I see whereas in weeks past it was week by week I was constantly seeing new pressure injuries. Overall I think it has been very beneficial for her. 07/03/18; patient arrives in my clinic today. She has deep punched out areas over her bilateral lateral malleoli. The area on the right has some more depth. [name], [name] ([id number]) We spent a lot [name] time today talking about pressure relief for these areas. This  started when her daughter asked for a prescription for a memory foam mattress. I have never written a prescription for a mattress and I don't think insurances would pay for that on an ordinary bed. In any case he came up that she has foam boots that she refuses to wear. I would suggest going to these before any other offloading issues when she is in bed. They say she is meticulous about offloading this the rest [name] the day 07/10/18- She is seen in follow-up evaluation for bilateral, lateral malleolus ulcers. There is no improvement in the ulcers. She has purchased and is sleeping on a memory foam mattress/overlay, she has been using the offloading boots nightly over the past week. She has a follow up appointment with vascular medicine at the end [name] October, in my opinion this follow up should be expedited given her deterioration and suboptimal TBI results. We will order plain film xray [name] the left ankle as deeper structures are palpable; would consider having MRI, regardless [name] xray report(s). The ulcers will be treated with iodoflex/iodosorb, she is unable to safely change the dressings daily with santyl. 07/19/18 on evaluation today patient appears to be doing in general visually well in regard to her bilateral lateral malleolus ulcers. She has been tolerating the dressing changes without complication which is good news. With that being said we did have an x-ray performed on 07/12/18 which revealed a slight loosen see in the lateral portion [name] the distal left fibula which may represent artifact but underline lytic destruction or osteomyelitis could not be excluded. MRI was recommended. With that being said we can see about getting the patient scheduled for an MRI to further evaluate this area. In fact we have that scheduled currently for August 20 19,019. 07/26/18 on evaluation today patient's wound on the right lateral ankle actually appears to be doing fairly well at this point in my pinion. She has  made some good progress currently. With that being said unfortunately in regard to  the left lateral ankle ulcer this seems to be a little bit more problematic at this time. In fact as I further evaluated the situation she actually had bone exposed which is the first time that's been the case in the bone appear to be necrotic. Currently I did review patient's note from Dr. Bunnie Domino office with [facility]. He stated that ABI was 1.26 on the right and 0.95 on the left with good waveforms. Her perfusion is stable not reduced from previous studies and her digital waveforms were pretty good particularly on the right. His conclusion upon review [name] the note was that there was not much she could do to improve her perfusion and he felt she was adequate for wound healing. His suggestion was that she continued to see [place] and consider a synthetic skin graft if there was no underlying infection. He plans to see her back in six months or as needed. 08/01/18 on evaluation today patient appears to be doing better in regard to her right lateral ankle ulcer. Her left lateral ankle ulcer is about the same she still has bone involvement in evidence [name] necrosis. There does not appear to be evidence [name] infection at this time On the right lateral lower extremity. I have started her on the Augmentin she picked this up and started this yesterday. This is to get her through until she sees infectious disease which is scheduled for 08/12/18. 08/06/18 on evaluation today patient appears to be doing rather well considering my discussion with patient's daughter at the end [name] last week. The area which was marked where she had erythema seems to be improved and this is good news. With that being said overall the patient seems to be making good improvement when it comes to the overall appearance [name] the right lateral ankle ulcer although this has been slow she at least is coming around in this regard. Unfortunately in  regard to the left lateral ankle ulcer this is osteomyelitis based on the pathology report as well is bone culture. Nonetheless we are still waiting CT scan. Unfortunately the MRI we originally ordered cannot be performed as the patient is a pacemaker which I had overlooked. Nonetheless we are working on the CT scan approval and scheduling as [name] now. She did go to the hospital over the weekend and was placed on IV Cefzo for a couple [name] days. Fortunately this seems to have improved the erythema quite significantly which is good news. There does not appear to be any evidence [name] worsening infection at this time. She did have some bleeding after the last debridement therefore I did not perform any sharp debridement in regard to left lateral ankle at this point. Patient has been approved for a snap vac for the right lateral ankle. 08/14/18; the patient with wounds over her bilateral lateral malleoli. The area on the right actually looks quite good. Been using a snap back on this area. Healthy granulation and appears to be filling in. Unfortunately the area on the left is really problematic. She had a recent CT scan on 08/13/18 that showed findings consistent with osteomyelitis [name] the lateral malleolus on the left. Also noted to have cellulitis. She saw Dr. Novella Olive [name] infectious disease today and was put on linezolid. We are able to verify this with her pharmacy. She is completed the Augmentin that she was already on. We've been using Iodoflex to this area 08/23/18 on evaluation today patient's wounds both actually appear to be doing better compared  to my prior evaluations. Fortunately she showing signs [name] good improvement in regard to the overall wound status especially where were using the snap vac on the right. In regard to left lateral malleolus the wound bed actually appears to be much cleaner than previously Carriero, [name] ([id number]) noted. I do not feel any phone directly probed during  evaluation today and though there is tendon noted this does not appear to be necrotic it's actually fairly good as far as the overall appearance [name] the tendon is concerned. In general the wound bed actually appears to be doing significantly better than it was previous. Patient is currently in the care [name] [name] and I did review that note today. He actually has her on two weeks [name] linezolid and then following the patient will be on 1-2 months [name] Keflex. That is the plan currently. She has been on antibiotics therapy as prescribed by myself initially starting on July 30, 2018 and has been on that continuously up to this point. 08/30/18 on evaluation today patient actually appears to be doing much better in regard to her right lateral malleolus ulcer. She has been tolerating the dressing changes specifically the snap vac without complication although she did have some issues with the seal currently. Apparently there was some trouble with getting it to maintain over the past week past Sunday. Nonetheless overall the wound appears better in regard to the right lateral malleolus region. In regard to left lateral malleolus this actually show some signs [name] additional granulation although there still tendon noted in the base [name] the wound this appears to be healthy not necrotic in any way whatsoever. We are considering potentially using a snap vac for the left lateral malleolus as well the product wrap from [employer], [place], was present in the clinic today we're going to see this patient I did have her come in with me after obtaining consent from the patient and her daughter in order to look at the wound and see if there's any recommendation one way or another as to whether or not they felt the snapback could be beneficial for the left lateral malleolus region. But the conclusion was that it might be but that this is definitely a little bit deeper wound than what traditionally would be utilized for a snap  vac. 09/06/18 on evaluation today patient actually appears to be doing excellent in my pinion in regard to both ankle ulcers. She has been tolerating the dressing changes without complication which is great news. Specifically we have been using the snap vac. In regard to the right ankle I'm not even sure that this is going to be necessary for today and following as the wound has filled in quite nicely. In regard to the left ankle I do believe that we're seeing excellent epithelialization from the edge as well as granulation in the central portion the tendon is still exposed but there's no evidence [name] necrotic bone and in general I feel like the patient has made excellent progress even compared to last week with just one week [name] the snap vac. 09/11/18; this is a patient who has wounds on her bilateral lateral malleoli. Initially both [name] these were deep stage IV wounds in the setting [name] chronic arterial insufficiency. She has been revascularized. As I understand think she been using snap vacs to both [name] these wounds however the area on the right became more superficial and currently she is only using it on the left. Using silver collagen on the  right and silver collagen under the back on the left I believe Objective Constitutional Sitting or standing Blood Pressure is within target range for patient.. Pulse regular and within target range for patient.Marland Kitchen Respirations regular, non-labored and within target range.. Temperature is normal and within the target range for the patient.Marland Kitchen appears in no distress. Vitals Time Taken: 10:21 AM, Height: 65 in, Weight: 154.3 lbs, BMI: 25.7, Temperature: 98.5 F, Pulse: 80 bpm, Respiratory Rate: 18 breaths/min, Blood Pressure: 118/42 mmHg. Eyes Conjunctivae clear. No discharge. Respiratory Respiratory effort is easy and symmetric bilaterally. Rate is normal at rest and on room air.. Cardiovascular Pedal pulses absent bilaterally.. She has a fair amount [name] edema in  the left leg greater than the right however I think the band [name], [name]. ([id number]) holding the back in place on the upper left calf was probably too tight. She was advised to loosen this somewhat. Lymphatic None palpable in the popliteal area bilaterally. Psychiatric No evidence [name] depression, anxiety, or agitation. Calm, cooperative, and communicative. Appropriate interactions and affect.. General Notes: Wound exam; both [name] these actually look excellent. The right is superficial with healthy granulation. On the left there is still some depth although the base [name] this looks very healthy. No debridement is required in either area. Integumentary (Hair, Skin) Wound #1 status is Open. Original cause [name] wound was Gradually Appeared. The wound is located on the Right,Lateral Malleolus. The wound measures 1.2cm length x 0.8cm width x 0.3cm depth; 0.754cm^2 area and 0.226cm^3 volume. There is Fat Layer (Subcutaneous Tissue) Exposed exposed. There is no tunneling or undermining noted. There is a medium amount [name] serosanguineous drainage noted. The wound margin is distinct with the outline attached to the wound base. There is small (1-33%) pink granulation within the wound bed. There is a small (1-33%) amount [name] necrotic tissue within the wound bed including Adherent Slough. The periwound skin appearance exhibited: Maceration, Rubor. The periwound skin appearance did not exhibit: Callus, Crepitus, Excoriation, Induration, Rash, Scarring, Dry/Scaly, Atrophie Blanche, Cyanosis, Ecchymosis, Hemosiderin Staining, Mottled, Pallor, Erythema. Periwound temperature was noted as No Abnormality. The periwound has tenderness on palpation. Wound #2 status is Open. Original cause [name] wound was Gradually Appeared. The wound is located on the Left,Lateral Malleolus. The wound measures 1.5cm length x 1.2cm width x 0.6cm depth; 1.414cm^2 area and 0.848cm^3 volume. There is Fat Layer (Subcutaneous Tissue) Exposed  exposed. There is no tunneling or undermining noted. There is a large amount [name] serous drainage noted. The wound margin is flat and intact. There is small (1-33%) pink granulation within the wound bed. There is a large (67-100%) amount [name] necrotic tissue within the wound bed including Eschar and Adherent Slough. The periwound skin appearance exhibited: Maceration. The periwound skin appearance did not exhibit: Callus, Crepitus, Excoriation, Induration, Rash, Scarring, Dry/Scaly, Atrophie Blanche, Cyanosis, Ecchymosis, Hemosiderin Staining, Mottled, Pallor, Rubor, Erythema. Periwound temperature was noted as No Abnormality. The periwound has tenderness on palpation. Assessment Active Problems ICD-10 Pressure ulcer [name] right ankle, stage 4 Pressure ulcer [name] left ankle, stage 4 Atherosclerosis [name] native arteries [name] right leg with ulceration [name] ankle Atherosclerosis [name] native arteries [name] left leg with ulceration [name] ankle Lymphedema, not elsewhere classified Tinea corporis Chronic multifocal osteomyelitis, left ankle and foot Plan Wound Cleansing: Wound #1 Right,Lateral Malleolus: Clean wound with Normal Saline. JOLISA, INTRIAGO ([id number]) Cleanse wound with mild soap and water May Shower, gently pat wound dry prior to applying new dressing. Wound #2 Left,Lateral Malleolus: Clean wound with Normal Saline. Cleanse  wound with mild soap and water May Shower, gently pat wound dry prior to applying new dressing. Anesthetic (add to Medication List): Wound #1 Right,Lateral Malleolus: Topical Lidocaine 4% cream applied to wound bed prior to debridement (In Clinic Only). Wound #2 Left,Lateral Malleolus: Topical Lidocaine 4% cream applied to wound bed prior to debridement (In Clinic Only). Primary Wound Dressing: Wound #1 Right,Lateral Malleolus: Silver Collagen Wound #2 Left,Lateral Malleolus: Silver Collagen Secondary Dressing: Wound #1 Right,Lateral Malleolus: Boardered Foam  Dressing Drawtex Dressing Change Frequency: Wound #1 Right,Lateral Malleolus: Change Dressing Monday, Wednesday, Friday Wound #2 Left,Lateral Malleolus: Change Dressing Monday, Wednesday, Friday Follow-up Appointments: Wound #1 Right,Lateral Malleolus: Return Appointment in 1 week. Wound #2 Left,Lateral Malleolus: Return Appointment in 1 week. Edema Control: Wound #1 Right,Lateral Malleolus: Patient to wear own compression stockings Patient to wear own Velcro compression garment. Elevate legs to the level [name] the heart and pump ankles as often as possible Wound #2 Left,Lateral Malleolus: Patient to wear own compression stockings Patient to wear own Velcro compression garment. Elevate legs to the level [name] the heart and pump ankles as often as possible Off-Loading: Wound #1 Right,Lateral Malleolus: Turn and reposition every 2 hours Other: - do not put pressure on your ankles Wound #2 Left,Lateral Malleolus: Turn and reposition every 2 hours Other: - do not put pressure on your ankles Additional Orders / Instructions: Wound #1 Right,Lateral Malleolus: Increase protein intake. Wound #2 Left,Lateral Malleolus: Increase protein intake. Home Health: Wound #1 Right,Lateral Malleolus: [place] Visits - [place] Nurse may visit PRN to address patient s wound care needs. FACE TO FACE ENCOUNTER: MEDICARE and MEDICAID PATIENTS: I certify that this patient is under my care and that I had a face-to-face encounter that meets the physician face-to-face encounter requirements with this patient on this date. The encounter with the patient was in whole or in part for the following MEDICAL CONDITION: (primary reason for [place]) MEDICAL NECESSITY: I certify, that based on my findings, NURSING services are a medically necessary home health service. HOME BOUND STATUS: I certify that my clinical findings support that this patient is homebound (i.e., Due to ELFREIDA, HEGGS ([id number]) illness or injury, pt requires aid [name] supportive devices such as crutches, cane, wheelchairs, walkers, the use [name] special transportation or the assistance [name] another person to leave their place [name] residence. There is a normal inability to leave the home and doing so requires considerable and taxing effort. Other absences are for medical reasons / religious services and are infrequent or [name] short duration when for other reasons). If current dressing causes regression in wound condition, may D/C ordered dressing product/s and apply Normal Saline Moist Dressing daily until next [place] / Other MD appointment. [place] [name] regression in wound condition at [phone]. Please direct any NON-WOUND related issues/requests for orders to patient's Primary Care Physician Wound #2 Left,Lateral Malleolus: [place] Visits - [place] Nurse may visit PRN to address patient s wound care needs. FACE TO FACE ENCOUNTER: MEDICARE and MEDICAID PATIENTS: I certify that this patient is under my care and that I had a face-to-face encounter that meets the physician face-to-face encounter requirements with this patient on this date. The encounter with the patient was in whole or in part for the following MEDICAL CONDITION: (primary reason for [place]) MEDICAL NECESSITY: I certify, that based on my findings, NURSING services are a medically necessary home health service. HOME BOUND STATUS: I certify that my  clinical findings support that this patient is homebound (i.e., Due to illness or injury, pt requires aid [name] supportive devices such as crutches, cane, wheelchairs, walkers, the use [name] special transportation or the assistance [name] another person to leave their place [name] residence. There is a normal inability to leave the home and doing so requires considerable and taxing effort. Other absences are for medical reasons / religious services  and are infrequent or [name] short duration when for other reasons). If current dressing causes regression in wound condition, may D/C ordered dressing product/s and apply Normal Saline Moist Dressing daily until next [place] / Other MD appointment. [place] [name] regression in wound condition at [phone]. Please direct any NON-WOUND related issues/requests for orders to patient's Primary Care Physician Negative Pressure Wound Therapy: Wound #2 Left,Lateral Malleolus: Other: - SNAP VAC #1 at this point I see no need to change the primary dressingsIE Silver collagen on the right and left under the wound VAC #2 continue the wound VAC on the left #3 advised to loosen the strap that is affixing her snap back on the left. She had too much edema in the left leg. Scant amount [name] edema on the right leg. #4 she has juxta leg stockings at home I don't think she is using these she certainly could use this on the right Electronic Signature(s) Signed: 09/11/2018 6:14:59 PM By: Linton Ham MD Entered By: Linton Ham on 09/11/2018 11:39:10 Haacke, [name] ([id number]) -------------------------------------------------------------------------------- SuperBill Details Patient Name: [name] Date [name] Service: 09/11/2018 Medical Record Number: [id number] Patient Account Number: [id number] Date [name] Birth/Sex: November 20, 1923 (84 y.o. F) Treating RN: Cornell Barman Primary Care Provider: Grayland Ormond Other Clinician: Referring Provider: Grayland Ormond Treating Provider/Extender: Tito Dine in Treatment: 56 Diagnosis Coding ICD-10 Codes Code Description L89.514 Pressure ulcer [name] right ankle, stage 4 L89.524 Pressure ulcer [name] left ankle, stage 4 I70.233 Atherosclerosis [name] native arteries [name] right leg with ulceration [name] ankle I70.243 Atherosclerosis [name] native arteries [name] left leg with ulceration [name] ankle I89.0 Lymphedema, not elsewhere classified B35.4  Tinea corporis 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, left ankle and foot Facility Procedures CPT4 Code: [id number] Description: 16553 NEG PRESS WND TX <=50 SQ CM ICD-10 Diagnosis Description L89.524 Pressure ulcer [name] left ankle, stage 4 Modifier: Quantity: 1 Physician Procedures CPT4 Code: 7482707 Description: 99213 - WC PHYS LEVEL 3 - EST PT ICD-10 Diagnosis Description 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

## 2018-09-14 NOTE — Progress Notes (Signed)
[name] ([id number]) Visit Report for 09/11/2018 Arrival Information Details Patient Name: [name] Date of Service: 09/11/2018 10:15 AM Medical Record Number: [id number] Patient Account Number: [id number] Date of Birth/Sex: [birth year] (84 y.o. F) Treating RN: Cornell Barman Primary Care Katie Faraone: Grayland Ormond Other Clinician: Referring Lucyann Romano: Grayland Ormond Treating Orlandis Sanden/Extender: Tito Dine in Treatment: 58 Visit Information History Since Last Visit Added or deleted any medications: No Patient Arrived: Ambulatory Any new allergies or adverse reactions: No Arrival Time: 10:20 Had a fall or experienced change in No Accompanied By: daughter activities of daily living that may affect Transfer Assistance: None risk of falls: Patient Identification Verified: Yes Signs or symptoms of abuse/neglect since last visito No Secondary Verification Process Yes Hospitalized since last visit: No Completed: Implantable device outside of the clinic excluding No Patient Requires Transmission-Based No cellular tissue based products placed in the center Precautions: since last visit: Patient Has Alerts: Yes Has Dressing in Place as Prescribed: Yes Patient Alerts: ABI AVVS 03/29/18 L Pain Present Now: Yes 1.05 R 1.03, TBI L .56, R .85 ABI AVVS 07/23/18 L .95 R 1.26 Electronic Signature(s) Signed: 09/11/2018 4:51:34 PM By: Lorine Bears RCP, RRT, CHT Entered By: Lorine Bears on 09/11/2018 10:21:29 [name] ([id number]) -------------------------------------------------------------------------------- Encounter Discharge Information Details Patient Name: [name] Date of Service: 09/11/2018 10:15 AM Medical Record Number: [id number] Patient Account Number: [id number] Date of Birth/Sex: [birth year] (84 y.o. F) Treating RN: Cornell Barman Primary Care Avianah Pellman: Grayland Ormond Other Clinician: Referring  Emalia Witkop: Grayland Ormond Treating Dajion Bickford/Extender: Tito Dine in Treatment: 65 Encounter Discharge Information Items Discharge Condition: Stable Ambulatory Status: Ambulatory Discharge Destination: Home Transportation: Private Auto Accompanied By: daughter Schedule Follow-up Appointment: Yes Clinical Summary of Care: Electronic Signature(s) Signed: 09/11/2018 5:34:51 PM By: Gretta Cool, BSN, RN, CWS, Kim RN, BSN Entered By: Gretta Cool, BSN, RN, CWS, Kim on 09/11/2018 11:10:32 [name] ([id number]) -------------------------------------------------------------------------------- Lower Extremity Assessment Details Patient Name: [name] Date of Service: 09/11/2018 10:15 AM Medical Record Number: [id number] Patient Account Number: [id number] Date of Birth/Sex: 09-30-1923 (84 y.o. F) Treating RN: Secundino Ginger Primary Care Nathalya Wolanski: Grayland Ormond Other Clinician: Referring Jariya Reichow: Grayland Ormond Treating Orena Cavazos/Extender: Tito Dine in Treatment: 47 Vascular Assessment Pulses: Dorsalis Pedis Palpable: [Left:Yes] [Right:Yes] Posterior Tibial Palpable: [Left:Yes] Extremity colors, hair growth, and conditions: Extremity Color: [Left:Hyperpigmented] [Right:Hyperpigmented] Hair Growth on Extremity: [Left:No] [Right:No] Temperature of Extremity: [Left:Warm] [Right:Warm] Capillary Refill: [Left:< 3 seconds] [Right:< 3 seconds] Toe Nail Assessment Left: Right: Thick: Yes Yes Discolored: Yes Yes Deformed: Yes Yes Improper Length and Hygiene: No Electronic Signature(s) Signed: 09/11/2018 11:44:54 AM By: Secundino Ginger Entered By: Secundino Ginger on 09/11/2018 10:37:04 [name] ([id number]) -------------------------------------------------------------------------------- Multi Wound Chart Details Patient Name: [name] Date of Service: 09/11/2018 10:15 AM Medical Record Number: [id number] Patient Account Number: [id number] Date of  Birth/Sex: 1923-06-09 (84 y.o. 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 N/A Comorbid History: Cataracts, Congestive Heart Cataracts, Congestive Heart N/A Failure, Hypertension, Failure, Hypertension, Osteoarthritis, Neuropathy Osteoarthritis, Neuropathy Date Acquired: 08/11/2015 04/12/2018 N/A [name]  of Treatment: 56 20 N/A Wound Status: Open Open N/A Measurements L x W x D 1.2x0.8x0.3 1.5x1.2x0.6 N/A (cm) Area (cm) : 0.754 1.414 N/A Volume (cm) : 0.226 0.848 N/A % Reduction in Area: 50.80% -80.10% N/A % Reduction in Volume: 50.80% -973.40% N/A Classification: Category/Stage IV Category/Stage IV N/A Exudate Amount: Medium Large N/A Exudate Type: Serosanguineous Serous N/A Exudate Color: red, brown amber N/A Wound Margin: Distinct, outline attached Flat and Intact N/A Granulation Amount: Small (1-33%) Small (1-33%) N/A Granulation Quality: Pink Pink N/A Necrotic Amount: Small (1-33%) Large (67-100%) N/A Necrotic Tissue: Adherent Slough Eschar, Adherent Slough N/A Exposed Structures: Fat Layer (Subcutaneous Fat Layer (Subcutaneous N/A Tissue) Exposed: Yes Tissue) Exposed: Yes Fascia: No Fascia: No Tendon: No Tendon: No [name] ([id number]) Muscle: No Muscle: No Joint: No Joint: No Bone: No Bone: No Epithelialization: None None N/A Periwound Skin  Texture: Excoriation: No Excoriation: No N/A Induration: No Induration: No Callus: No Callus: No Crepitus: No Crepitus: No Rash: No Rash: No Scarring: No Scarring: No Periwound Skin Moisture: Maceration: Yes Maceration: Yes N/A Dry/Scaly: No Dry/Scaly: No Periwound Skin Color: Rubor: Yes Atrophie Blanche: No N/A Atrophie Blanche: No Cyanosis: No Cyanosis: No Ecchymosis: No Ecchymosis: No Erythema: No Erythema: No Hemosiderin Staining: No Hemosiderin Staining: No Mottled: No Mottled: No Pallor: No Pallor: No Rubor: No Temperature: No Abnormality No Abnormality N/A Tenderness on Palpation: Yes Yes N/A Wound Preparation: Ulcer Cleansing: Ulcer Cleansing: N/A Rinsed/Irrigated with Saline Rinsed/Irrigated with Saline Topical Anesthetic Applied: Topical Anesthetic Applied: Other: lidocaine 4% Other: lidocaine 4% Treatment Notes Wound #1 (Right, Lateral Malleolus) 1. Cleansed with: Clean wound with Normal Saline 4. Dressing Applied: Prisma Ag Notes prisma, drawtex on right; mepitel one over tendon, prisma ag, snap vac on left Wound #2 (Left, Lateral Malleolus) 1. Cleansed with: Clean wound with Normal Saline 4. Dressing Applied: Prisma Ag Notes prisma, drawtex on right; mepite one over tendon, prisma, snap vac Electronic Signature(s) Signed: 09/11/2018 6:14:59 PM By: Linton Ham MD Entered By: Linton Ham on 09/11/2018 11:30:56 Bloxom, [name] ([id number]) -------------------------------------------------------------------------------- [place] Details Patient Name: [name] Date of Service: 09/11/2018 10:15 AM Medical Record Number: [id number] Patient Account Number: [id number] Date of Birth/Sex: 03-29-1923 (84 y.o. F) Treating RN: Cornell Barman Primary Care Prerna Harold: Grayland Ormond Other Clinician: Referring Cordel Drewes: Grayland Ormond Treating Kelcie Currie/Extender: Tito Dine in Treatment: 94 Active  Inactive ` Abuse / Safety / Falls / Self Care Management Nursing Diagnoses: Potential for falls Goals: Patient will not experience any injury related to falls Date Initiated: 08/10/2017 Target Resolution Date: 11/10/2017 Goal Status: Active Interventions: Assess Activities of Daily Living upon admission and as needed Assess fall risk on admission and as needed Assess: immobility, friction, shearing, incontinence upon admission and as needed Notes: ` Nutrition Nursing Diagnoses: Imbalanced nutrition Potential for alteratiion in Nutrition/Potential for imbalanced nutrition Goals: Patient/caregiver agrees to and verbalizes understanding of need to use nutritional supplements and/or vitamins as prescribed Date Initiated: 08/10/2017 Target Resolution Date: 12/08/2017 Goal Status: Active Interventions: Assess patient nutrition upon admission and as needed per policy Notes: ` Orientation to the Wound Care Program Nursing Diagnoses: Knowledge deficit related to the wound healing center program Goals: Patient/caregiver will verbalize understanding of the [place] Program Date Initiated: 08/10/2017 Target Resolution Date: 09/08/2017 JANUS, VLCEK ([id number]) Goal Status: Active Interventions: Provide education on orientation to the wound center Notes: ` Pain, Acute or Chronic Nursing Diagnoses: Pain, acute or chronic: actual or potential Potential alteration in comfort, pain Goals:  Patient/caregiver will verbalize adequate pain control between visits Date Initiated: 08/10/2017 Target Resolution Date: 12/08/2017 Goal Status: Active Interventions: Complete pain assessment as per visit requirements Notes: ` Wound/Skin Impairment Nursing Diagnoses: Impaired tissue integrity Knowledge deficit related to ulceration/compromised skin integrity Goals: Ulcer/skin breakdown will have a volume reduction of 80% by week 12 Date Initiated: 08/10/2017 Target Resolution Date:  12/01/2017 Goal Status: Active Interventions: Assess patient/caregiver ability to perform ulcer/skin care regimen upon admission and as needed Notes: Electronic Signature(s) Signed: 09/11/2018 5:34:51 PM By: Gretta Cool, BSN, RN, CWS, Kim RN, BSN Entered By: Gretta Cool, BSN, RN, CWS, Kim on 09/11/2018 10:49:14 Sanna, [name] ([id number]) -------------------------------------------------------------------------------- Pain Assessment Details Patient Name: [name] Date of Service: 09/11/2018 10:15 AM Medical Record Number: [id number] Patient Account Number: [id number] Date of Birth/Sex: 1923-09-06 (84 y.o. F) Treating RN: Cornell Barman Primary Care Jordy Verba: Grayland Ormond Other Clinician: Referring Breyanna Valera: Grayland Ormond Treating Taja Pentland/Extender: Tito Dine in Treatment: 38 Active Problems Location of Pain Severity and Description of Pain Patient Has Paino Yes Site Locations Rate the pain. Current Pain Level: 3 Pain Management and Medication Current Pain Management: Electronic Signature(s) Signed: 09/11/2018 4:51:34 PM By: Lorine Bears RCP, RRT, CHT Signed: 09/11/2018 5:34:51 PM By: Gretta Cool, BSN, RN, CWS, Kim RN, BSN Entered By: Lorine Bears on 09/11/2018 10:21:50 [name] ([id number]) -------------------------------------------------------------------------------- Patient/Caregiver Education Details Patient Name: [name] Date of Service: 09/11/2018 10:15 AM Medical Record Number: [id number] Patient Account Number: [id number] Date of Birth/Gender: 05/26/1923 (84 y.o. F) Treating RN: Cornell Barman Primary Care Physician: Grayland Ormond Other Clinician: Referring Physician: Grayland Ormond Treating Physician/Extender: Tito Dine in Treatment: 77 Education Assessment Education Provided To: Patient Education Topics Provided Offloading: Handouts: What is Offloadingo, Other: keep pressure off of  ankles Methods: Demonstration, Explain/Verbal Responses: State content correctly Wound/Skin Impairment: Handouts: Caring for Your Ulcer Methods: Demonstration, Explain/Verbal Responses: State content correctly Electronic Signature(s) Signed: 09/11/2018 5:34:51 PM By: Gretta Cool, BSN, RN, CWS, Kim RN, BSN Entered By: Gretta Cool, BSN, RN, CWS, Kim on 09/11/2018 11:10:55 Esquer, [name] ([id number]) -------------------------------------------------------------------------------- Wound Assessment Details Patient Name: [name] Date of Service: 09/11/2018 10:15 AM Medical Record Number: [id number] Patient Account Number: [id number] Date of Birth/Sex: August 16, 1923 (84 y.o. F) Treating RN: Secundino Ginger Primary Care Khyren Hing: Grayland Ormond Other Clinician: Referring Alys Dulak: Grayland Ormond Treating Marijane Trower/Extender: Tito Dine in Treatment: 51 Wound Status Wound Number: 1 Primary Pressure Ulcer Etiology: Wound Location: Right Malleolus - Lateral Secondary Arterial Insufficiency Ulcer Wounding Event: Gradually Appeared Etiology: Date Acquired: 08/11/2015 Wound Status: Open Weeks Of Treatment: 56 Comorbid Cataracts, Congestive Heart Failure, Clustered Wound: No History: Hypertension, Osteoarthritis, Neuropathy Photos Photo Uploaded By: Secundino Ginger on 09/11/2018 10:56:33 Wound Measurements Length: (cm) 1.2 Width: (cm) 0.8 Depth: (cm) 0.3 Area: (cm) 0.754 Volume: (cm) 0.226 % Reduction in Area: 50.8% % Reduction in Volume: 50.8% Epithelialization: None Tunneling: No Undermining: No Wound Description Classification: Category/Stage IV Foul Odor Wound Margin: Distinct, outline attached Slough/Fi Exudate Amount: Medium Exudate Type: Serosanguineous Exudate Color: red, brown After Cleansing: No brino Yes Wound Bed Granulation Amount: Small (1-33%) Exposed Structure Granulation Quality: Pink Fascia Exposed: No Necrotic Amount: Small (1-33%) Fat Layer  (Subcutaneous Tissue) Exposed: Yes Necrotic Quality: Adherent Slough Tendon Exposed: No Muscle Exposed: No Joint Exposed: No Bone Exposed: No Periwound Skin Texture Waldrop, [name] ([id number]) Texture Color No Abnormalities Noted: No No Abnormalities Noted: No Callus: No Atrophie Blanche: No Crepitus: No Cyanosis: No Excoriation: No Ecchymosis: No Induration: No Erythema: No Rash:  No Hemosiderin Staining: No Scarring: No Mottled: No Pallor: No Moisture Rubor: Yes No Abnormalities Noted: No Dry / Scaly: No Temperature / Pain Maceration: Yes Temperature: No Abnormality Tenderness on Palpation: Yes Wound Preparation Ulcer Cleansing: Rinsed/Irrigated with Saline Topical Anesthetic Applied: Other: lidocaine 4%, Treatment Notes Wound #1 (Right, Lateral Malleolus) 1. Cleansed with: Clean wound with Normal Saline 4. Dressing Applied: Prisma Ag Notes prisma, drawtex on right; mepitel one over tendon, prisma ag, snap vac on left Electronic Signature(s) Signed: 09/11/2018 11:44:54 AM By: Secundino Ginger Entered By: Secundino Ginger on 09/11/2018 10:34:27 Dennin, [name] ([id number]) -------------------------------------------------------------------------------- Wound Assessment Details Patient Name: [name] Date of Service: 09/11/2018 10:15 AM Medical Record Number: [id number] Patient Account Number: [id number] Date of Birth/Sex: 07/19/1923 (84 y.o. F) Treating RN: Secundino Ginger Primary Care Davarion Cuffee: Grayland Ormond Other Clinician: Referring Aino Heckert: Grayland Ormond Treating Adelaide Pfefferkorn/Extender: Tito Dine in Treatment: 3 Wound Status Wound Number: 2 Primary Pressure Ulcer Etiology: Wound Location: Left Malleolus - Lateral Secondary Arterial Insufficiency Ulcer Wounding Event: Gradually Appeared Etiology: Date Acquired: 04/12/2018 Wound Status: Open Weeks Of Treatment: 20 Comorbid Cataracts, Congestive Heart Failure, Clustered Wound: No History:  Hypertension, Osteoarthritis, Neuropathy Photos Photo Uploaded By: Secundino Ginger on 09/11/2018 10:56:34 Wound Measurements Length: (cm) 1.5 % Reductio Width: (cm) 1.2 % Reductio Depth: (cm) 0.6 Epithelial Area: (cm) 1.414 Tunneling Volume: (cm) 0.848 Undermini n in Area: -80.1% n in Volume: -973.4% ization: None : No ng: No Wound Description Classification: Category/Stage IV Foul Odor Wound Margin: Flat and Intact Slough/Fib Exudate Amount: Large Exudate Type: Serous Exudate Color: amber After Cleansing: No rino Yes Wound Bed Granulation Amount: Small (1-33%) Exposed Structure Granulation Quality: Pink Fascia Exposed: No Necrotic Amount: Large (67-100%) Fat Layer (Subcutaneous Tissue) Exposed: Yes Necrotic Quality: Eschar, Adherent Slough Tendon Exposed: No Muscle Exposed: No Joint Exposed: No Bone Exposed: No Periwound Skin Texture Bostelman, [name] ([id number]) Texture Color No Abnormalities Noted: No No Abnormalities Noted: No Callus: No Atrophie Blanche: No Crepitus: No Cyanosis: No Excoriation: No Ecchymosis: No Induration: No Erythema: No Rash: No Hemosiderin Staining: No Scarring: No Mottled: No Pallor: No Moisture Rubor: No No Abnormalities Noted: No Dry / Scaly: No Temperature / Pain Maceration: Yes Temperature: No Abnormality Tenderness on Palpation: Yes Wound Preparation Ulcer Cleansing: Rinsed/Irrigated with Saline Topical Anesthetic Applied: Other: lidocaine 4%, Treatment Notes Wound #2 (Left, Lateral Malleolus) 1. Cleansed with: Clean wound with Normal Saline 4. Dressing Applied: Prisma Ag Notes prisma, drawtex on right; mepite one over tendon, prisma, snap vac Electronic Signature(s) Signed: 09/11/2018 11:44:54 AM By: Secundino Ginger Entered By: Secundino Ginger on 09/11/2018 10:36:24 [name] ([id number]) -------------------------------------------------------------------------------- Vitals Details Patient Name: [name] Date of Service: 09/11/2018 10:15 AM Medical Record Number: [id number] Patient Account Number: [id number] Date of Birth/Sex: 10/04/1923 (84 y.o. F) Treating RN: Cornell Barman Primary Care Piper Albro: Grayland Ormond Other Clinician: Referring Johnnae Impastato: Grayland Ormond Treating Yanai Hobson/Extender: Tito Dine in Treatment: 56 Vital Signs Time Taken: 10:21 Temperature (F): 98.5 Height (in): 65 Pulse (bpm): 80 Weight (lbs): 154.3 Respiratory Rate (breaths/min): 18 Body Mass Index (BMI): 25.7 Blood Pressure (mmHg): 118/42 Reference Range: 80 - 120 mg / dl Electronic Signature(s) Signed: 09/11/2018 4:51:34 PM By: Lorine Bears RCP, RRT, CHT Entered By: Lorine Bears on 09/11/2018 10:26:36

## 2018-09-19 ENCOUNTER — Encounter: Payer: Medicare Other | Admitting: Physician Assistant

## 2018-09-19 DIAGNOSIS — I70243 Atherosclerosis of native arteries of left leg with ulceration of ankle: Secondary | ICD-10-CM | POA: Diagnosis not present

## 2018-09-21 NOTE — Progress Notes (Signed)
[name] ([id number]) Visit Report for 09/19/2018 Arrival Information Details Patient Name: [name], [name] Date of Service: 09/19/2018 12:45 PM Medical Record Number: [id number] Patient Account Number: [id number] Date of Birth/Sex: 1923/09/19 (84 y.o. F) Treating RN: Secundino Ginger Primary Care [name]: [name] Other Clinician: Referring [name]: [name] Treating Adeliz Tonkinson/Extender: Melburn Hake, HOYT Weeks in Treatment: 53 Visit Information History Since Last Visit Added or deleted any medications: No Patient Arrived: Ambulatory Any new allergies or adverse reactions: No Arrival Time: 12:38 Had a fall or experienced change in No Accompanied By: self activities of daily living that may affect Transfer Assistance: None risk of falls: Patient Identification Verified: Yes Signs or symptoms of abuse/neglect since last visito No Secondary Verification Process Yes Hospitalized since last visit: No Completed: Implantable device outside of the clinic excluding No Patient Requires Transmission-Based No cellular tissue based products placed in the center Precautions: since last visit: Patient Has Alerts: Yes Has Dressing in Place as Prescribed: Yes Patient Alerts: ABI AVVS 03/29/18 L Pain Present Now: No 1.05 R 1.03, TBI L .56, R .85 ABI AVVS 07/23/18 L .95 R 1.26 Electronic Signature(s) Signed: 09/19/2018 4:02:04 PM By: Secundino Ginger Entered By: Secundino Ginger on 09/19/2018 12:42:04 [name] ([id number]) -------------------------------------------------------------------------------- Encounter Discharge Information Details Patient Name: [name] Date of Service: 09/19/2018 12:45 PM Medical Record Number: [id number] Patient Account Number: [id number] Date of Birth/Sex: July 01, 1923 (84 y.o. F) Treating RN: Cornell Barman Primary Care [name]: [name] Other Clinician: Referring Jerilynn Feldmeier: [name] Treating Kourtlynn Trevor/Extender: Melburn Hake, HOYT Weeks in Treatment: 10 Encounter Discharge Information Items Discharge Condition: Stable Ambulatory Status: Ambulatory Discharge Destination: Home Transportation: Private Auto Accompanied By: self Schedule Follow-up Appointment: Yes Clinical Summary of Care: Post Procedure Vitals: Temperature (F): 97.9 Pulse (bpm): 86 Respiratory Rate (breaths/min): 16 Blood Pressure (mmHg): 150/74 Electronic Signature(s) Signed: 09/19/2018 5:03:58 PM By: Gretta Cool, BSN, RN, CWS, Kim RN, BSN Entered By: Gretta Cool, BSN, RN, CWS, Kim on 09/19/2018 13:22:44 [name] ([id number]) -------------------------------------------------------------------------------- Lower Extremity Assessment Details Patient Name: [name] Date of Service: 09/19/2018 12:45 PM Medical Record Number: [id number] Patient Account Number: [id number] Date of Birth/Sex: [birth year] (84 y.o. F) Treating RN: Secundino Ginger Primary Care Rolanda Campa: Grayland [name] Other Clinician: Referring Shy Guallpa: Grayland [name] Treating Rubyann Lingle/Extender: Melburn Hake, HOYT Weeks in Treatment: 74 Electronic Signature(s) Signed: 09/19/2018 4:02:04 PM By: Secundino Ginger Entered By: Secundino Ginger on 09/19/2018 12:43:22 [name] ([id number]) -------------------------------------------------------------------------------- Multi Wound Chart Details Patient Name: [name] Date of Service: 09/19/2018 12:45 PM Medical Record Number: [id number] Patient Account Number: [id number] Date of Birth/Sex: 08-Jun-1923 (84 y.o. 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 N/A Comorbid History: Cataracts, Congestive Heart Cataracts, Congestive Heart N/A Failure, Hypertension, Failure, Hypertension, Osteoarthritis, Neuropathy Osteoarthritis, Neuropathy Date Acquired: 08/11/2015 04/12/2018 N/A Weeks of Treatment: 57 21 N/A Wound Status: Open Open N/A Measurements L x W x D 0.8x0.8x0.3 1.5x1.2x0.4 N/A (cm) Area (cm) : 0.503 1.414 N/A Volume (cm) : 0.151 0.565 N/A % Reduction in Area: 67.20% -80.10% N/A % Reduction in Volume: 67.10% -615.20% N/A Classification: Category/Stage  IV Category/Stage IV N/A Exudate Amount: Small Medium N/A Exudate Type: Serous Serosanguineous N/A Exudate Color: amber red, brown N/A Wound Margin: Distinct, outline attached Flat and Intact N/A Granulation Amount: Small (1-33%) Medium (34-66%) N/A Granulation Quality: Pink Pink N/A Necrotic Amount: Small (1-33%) Medium (34-66%) N/A Necrotic Tissue: Adherent Slough Eschar, Adherent Slough N/A Exposed Structures: Fat Layer (Subcutaneous Fat Layer (Subcutaneous N/A Tissue) Exposed: Yes Tissue) Exposed: Yes Fascia: No Fascia: No Tendon: No Tendon: No [name] ([id number]) Muscle: No Muscle: No Joint: No Joint: No Bone: No Bone: No Epithelialization: None None N/A Periwound Skin Texture: Excoriation: No Excoriation: No N/A Induration: No Induration: No Callus: No Callus: No Crepitus: No Crepitus: No Rash: No Rash: No Scarring: No Scarring: No Periwound Skin Moisture: Maceration: Yes Maceration: Yes N/A Dry/Scaly: No Dry/Scaly: No Periwound Skin Color: Rubor: Yes Atrophie Blanche: No N/A Atrophie Blanche: No Cyanosis: No Cyanosis: No Ecchymosis: No Ecchymosis: No Erythema: No Erythema: No Hemosiderin Staining:  No Hemosiderin Staining: No Mottled: No Mottled: No Pallor: No Pallor: No Rubor: No Temperature: No Abnormality No Abnormality N/A Tenderness on Palpation: Yes Yes N/A Wound Preparation: Ulcer Cleansing: Ulcer Cleansing: N/A Rinsed/Irrigated with Saline Rinsed/Irrigated with Saline Topical Anesthetic Applied: Topical Anesthetic Applied: Other: lidocaine 4% Other: lidocaine 4% Treatment Notes Electronic Signature(s) Signed: 09/19/2018 5:03:58 PM By: Gretta Cool, BSN, RN, CWS, Kim RN, BSN Entered By: Gretta Cool, BSN, RN, CWS, Kim on 09/19/2018 12:59:22 Joni Fears [name] ([id number]) -------------------------------------------------------------------------------- [place] Details Patient Name: [name], [name] Date of Service: 09/19/2018 12:45 PM Medical Record Number: [id number] Patient Account Number: [id number] Date of Birth/Sex: 1923-11-21 (84 y.o. F) Treating RN: Cornell Barman Primary Care Tonica Brasington: [name] Other Clinician: Referring Amiyah Shryock: [name] Treating Vernice Mannina/Extender: Melburn Hake, HOYT Weeks in Treatment: 25 Active Inactive ` Abuse / Safety / Falls / Self Care Management Nursing Diagnoses: Potential for falls Goals: Patient will not experience any injury related to falls Date Initiated: 08/10/2017 Target Resolution Date: 11/10/2017 Goal Status: Active Interventions: Assess Activities of Daily Living upon admission and as needed Assess fall risk on admission and as needed Assess: immobility, friction, shearing, incontinence upon admission and as needed Notes: ` Nutrition Nursing Diagnoses: Imbalanced nutrition Potential for alteratiion in Nutrition/Potential for imbalanced nutrition Goals: Patient/caregiver agrees to and verbalizes understanding of need to use nutritional supplements and/or vitamins as prescribed Date Initiated: 08/10/2017 Target Resolution Date: 12/08/2017 Goal Status: Active Interventions: Assess patient nutrition  upon admission and as needed per policy Notes: ` Orientation to the Wound Care Program Nursing Diagnoses: Knowledge deficit related to the wound healing center program Goals: Patient/caregiver will verbalize understanding of the [facility] Date Initiated: 08/10/2017 Target Resolution Date: 09/08/2017 NATANE, HEWARD ([id number]) Goal Status: Active Interventions: Provide education on orientation to the wound center Notes: ` Pain, Acute or Chronic Nursing Diagnoses: Pain, acute or chronic: actual or potential Potential alteration in comfort, pain Goals: Patient/caregiver will verbalize adequate pain control between visits Date Initiated: 08/10/2017 Target Resolution Date: 12/08/2017 Goal Status: Active Interventions: Complete pain assessment as per visit requirements Notes: ` Wound/Skin Impairment Nursing Diagnoses: Impaired tissue integrity Knowledge deficit related to ulceration/compromised skin integrity Goals: Ulcer/skin breakdown will have a volume reduction of 80% by week 12 Date Initiated: 08/10/2017 Target Resolution Date: 12/01/2017 Goal Status: Active Interventions: Assess patient/caregiver ability to perform ulcer/skin care regimen upon admission and as needed Notes: Electronic Signature(s) Signed: 09/19/2018 5:03:58 PM By: Gretta Cool, BSN, RN, CWS, Kim RN, BSN Entered By: Gretta Cool, BSN, RN, CWS, Kim on 09/19/2018 12:59:12  TAKILA, KRONBERG ([id number]) -------------------------------------------------------------------------------- Pain Assessment Details Patient Name: [name], EMERT. Date of Service: 09/19/2018 12:45 PM Medical Record Number: [id number] Patient Account Number: [id number] Date of Birth/Sex: [birth year] (84 y.o. F) Treating RN: Secundino Ginger Primary Care Ethanael Veith: [name] Other Clinician: Referring Shequilla Goodgame: [name] Treating Terrilynn Postell/Extender: Melburn Hake, HOYT Weeks in Treatment: 82 Active Problems Location of Pain  Severity and Description of Pain Patient Has Paino No Site Locations Pain Management and Medication Current Pain Management: Goals for Pain Management pt denies any pain at this time. Electronic Signature(s) Signed: 09/19/2018 4:02:04 PM By: Secundino Ginger Entered By: Secundino Ginger on 09/19/2018 12:42:45 [name] ([id number]) -------------------------------------------------------------------------------- Patient/Caregiver Education Details Patient Name: [name] Date of Service: 09/19/2018 12:45 PM Medical Record Number: [id number] Patient Account Number: [id number] Date of Birth/Gender: 1923/01/03 (84 y.o. F) Treating RN: Cornell Barman Primary Care Physician: [name] Other Clinician: Referring Physician: Grayland [name] Treating Physician/Extender: Sharalyn Ink in Treatment: 60 Education Assessment Education Provided To: Patient Education Topics Provided Wound/Skin Impairment: Handouts: Caring for Your Ulcer Methods: Demonstration, Explain/Verbal Responses: State content correctly Electronic Signature(s) Signed: 09/19/2018 5:03:58 PM By: Gretta Cool, BSN, RN, CWS, Kim RN, BSN Entered By: Gretta Cool, BSN, RN, CWS, Kim on 09/19/2018 13:06:27 [name] ([id number]) -------------------------------------------------------------------------------- Wound Assessment Details Patient Name: [name] Date of Service: 09/19/2018 12:45 PM Medical Record Number: [id number] Patient Account Number: [id number] Date of Birth/Sex: [birth year] (84 y.o. F) Treating RN: Secundino Ginger Primary Care Shery Wauneka: [name] Other Clinician: Referring Xzaviar Maloof: [name] Treating Floretta Petro/Extender: Melburn Hake, HOYT Weeks in Treatment: 11 Wound Status Wound Number: 1 Primary Pressure Ulcer Etiology: Wound Location: Right Malleolus - Lateral Secondary Arterial Insufficiency Ulcer Wounding Event: Gradually Appeared Etiology: Date Acquired: 08/11/2015 Wound  Status: Open Weeks Of Treatment: 57 Comorbid Cataracts, Congestive Heart Failure, Clustered Wound: No History: Hypertension, Osteoarthritis, Neuropathy Photos Photo Uploaded By: Secundino Ginger on 09/19/2018 12:54:17 Wound Measurements Length: (cm) 0.8 % Reductio Width: (cm) 0.8 % Reductio Depth: (cm) 0.3 Epithelial Area: (cm) 0.503 Tunneling Volume: (cm) 0.151 Undermini n in Area: 67.2% n in Volume: 67.1% ization: None : No ng: No Wound Description Classification: Category/Stage IV Foul Odor Wound Margin: Distinct, outline attached Slough/Fib Exudate Amount: Small Exudate Type: Serous Exudate Color: amber After Cleansing: No rino Yes Wound Bed Granulation Amount: Small (1-33%) Exposed Structure Granulation Quality: Pink Fascia Exposed: No Necrotic Amount: Small (1-33%) Fat Layer (Subcutaneous Tissue) Exposed: Yes Necrotic Quality: Adherent Slough Tendon Exposed: No Muscle Exposed: No Joint Exposed: No Bone Exposed: No Periwound Skin Texture Lesnick, [name] ([id number]) Texture Color No Abnormalities Noted: No No Abnormalities Noted: No Callus: No Atrophie Blanche: No Crepitus: No Cyanosis: No Excoriation: No Ecchymosis: No Induration: No Erythema: No Rash: No Hemosiderin Staining: No Scarring: No Mottled: No Pallor: No Moisture Rubor: Yes No Abnormalities Noted: No Dry / Scaly: No Temperature / Pain Maceration: Yes Temperature: No Abnormality Tenderness on Palpation: Yes Wound Preparation Ulcer Cleansing: Rinsed/Irrigated with Saline Topical Anesthetic Applied: Other: lidocaine 4%, Treatment Notes Wound #1 (Right, Lateral Malleolus) 4. Dressing Applied: Prisma Ag Notes Bordered Foam on Right; Snap Vac on Left Electronic Signature(s) Signed: 09/19/2018 4:02:04 PM By: Secundino Ginger Entered By: Secundino Ginger on 09/19/2018 12:50:37 Kutzer, [name] ([id number]) -------------------------------------------------------------------------------- Wound  Assessment Details Patient Name: [name] Date of Service: 09/19/2018 12:45 PM Medical Record Number: [id number] Patient Account Number: [id number] Date of Birth/Sex: [birth year] (84 y.o. F) Treating RN: Secundino Ginger Primary Care Alleya Demeter: [name] Other Clinician: Referring Oron Westrup: Venetia Maxon,  JASON Treating Cove Haydon/Extender: STONE III, HOYT Weeks in Treatment: 57 Wound Status Wound Number: 2 Primary Pressure Ulcer Etiology: Wound Location: Left Malleolus - Lateral Secondary Arterial Insufficiency Ulcer Wounding Event: Gradually Appeared Etiology: Date Acquired: 04/12/2018 Wound Status: Open Weeks Of Treatment: 21 Comorbid Cataracts, Congestive Heart Failure, Clustered Wound: No History: Hypertension, Osteoarthritis, Neuropathy Photos Photo Uploaded By: Secundino Ginger on 09/19/2018 12:54:19 Wound Measurements Length: (cm) 1.5 % Reduction Width: (cm) 1.2 % Reduction Depth: (cm) 0.4 Epitheliali Area: (cm) 1.414 Tunneling: Volume: (cm) 0.565 Underminin in Area: -80.1% in Volume: -615.2% zation: None No g: No Wound Description Classification: Category/Stage IV Foul Odor A Wound Margin: Flat and Intact Slough/Fibr Exudate Amount: Medium Exudate Type: Serosanguineous Exudate Color: red, brown fter Cleansing: No ino Yes Wound Bed Granulation Amount: Medium (34-66%) Exposed Structure Granulation Quality: Pink Fascia Exposed: No Necrotic Amount: Medium (34-66%) Fat Layer (Subcutaneous Tissue) Exposed: Yes Necrotic Quality: Eschar, Adherent Slough Tendon Exposed: No Muscle Exposed: No Joint Exposed: No Bone Exposed: No Periwound Skin Texture Turvey, [name] ([id number]) Texture Color No Abnormalities Noted: No No Abnormalities Noted: No Callus: No Atrophie Blanche: No Crepitus: No Cyanosis: No Excoriation: No Ecchymosis: No Induration: No Erythema: No Rash: No Hemosiderin Staining: No Scarring: No Mottled: No Pallor: No Moisture Rubor:  No No Abnormalities Noted: No Dry / Scaly: No Temperature / Pain Maceration: Yes Temperature: No Abnormality Tenderness on Palpation: Yes Wound Preparation Ulcer Cleansing: Rinsed/Irrigated with Saline Topical Anesthetic Applied: Other: lidocaine 4%, Treatment Notes Wound #2 (Left, Lateral Malleolus) 4. Dressing Applied: Prisma Ag Notes Bordered Foam on Right; Snap Vac on Left Electronic Signature(s) Signed: 09/19/2018 4:02:04 PM By: Secundino Ginger Entered By: Secundino Ginger on 09/19/2018 12:50:23 Chiem, [name] ([id number]) -------------------------------------------------------------------------------- [place] Details Patient Name: [name] Date of Service: 09/19/2018 12:45 PM Medical Record Number: [id number] Patient Account Number: [id number] Date of Birth/Sex: [birth year] (84 y.o. F) Treating RN: Secundino Ginger Primary Care Grayden Burley: [name] Other Clinician: Referring Neddie Steedman: [name] Treating Ananya Mccleese/Extender: Melburn Hake, HOYT Weeks in Treatment: 57 Vital Signs Time Taken: 12:42 Temperature (F): 97.9 Height (in): 65 Pulse (bpm): 86 Weight (lbs): 154.3 Respiratory Rate (breaths/min): 16 Body Mass Index (BMI): 25.7 Blood Pressure (mmHg): 150/74 Reference Range: 80 - 120 mg / dl Electronic Signature(s) Signed: 09/19/2018 4:02:04 PM By: Secundino Ginger Entered By: Secundino Ginger on 09/19/2018 12:43:14

## 2018-09-21 NOTE — Progress Notes (Signed)
MAXIMA, SKELTON ([id number]) Visit Report for 09/19/2018 Chief Complaint Document Details Patient Name: [name], [name]. Date of Service: 09/19/2018 12:45 PM Medical Record Number: [id number] Patient Account Number: [id number] Date of Birth/Sex: 1922-12-15 (84 y.o. F) Treating RN: Cornell Barman Primary Care Provider: Grayland Ormond Other Clinician: Referring Provider: Grayland Ormond Treating Provider/Extender: Melburn Hake, HOYT Weeks in Treatment: 77 Information Obtained from: Patient Chief Complaint Patient is here for right lateral malleolus and left lateral malleolus ulcer Electronic Signature(s) Signed: 09/19/2018 5:08:41 PM By: Worthy Keeler PA-C Entered By: Worthy Keeler on 09/19/2018 14:02:00 Fagin, [name] ([id number]) -------------------------------------------------------------------------------- Debridement Details Patient Name: [name] Date of Service: 09/19/2018 12:45 PM Medical Record Number: [id number] Patient Account Number: [id number] Date of Birth/Sex: 01/02/1923 (84 y.o. F) Treating RN: Cornell Barman Primary Care Provider: Grayland Ormond Other Clinician: Referring Provider: Grayland Ormond Treating Provider/Extender: Melburn Hake, HOYT Weeks in Treatment: 69 Debridement Performed for Wound #1 Right,Lateral Malleolus Assessment: Performed By: Physician STONE III, HOYT E., PA-C Debridement Type: Debridement Severity of Tissue Pre Fat layer exposed Debridement: Level of Consciousness (Pre- Awake and Alert procedure): Pre-procedure Verification/Time Yes - 12:55 Out Taken: Start Time: 12:56 Pain Control: Other : lidocaine 4% Total Area Debrided (L x W): 0.8 (cm) x 0.8 (cm) = 0.64 (cm) Tissue and other material Viable, Skin: Epidermis debrided: Level: Skin/Epidermis Debridement Description: Selective/Open Wound Instrument: Curette Bleeding: Minimum Hemostasis Achieved: Pressure End Time: 13:00 Procedural Pain: 0 Post Procedural  Pain: 2 Response to Treatment: Procedure was tolerated well Level of Consciousness Awake and Alert (Post-procedure): Post Debridement Measurements of Total Wound Length: (cm) 0.8 Stage: Category/Stage IV Width: (cm) 0.8 Depth: (cm) 0.3 Volume: (cm) 0.151 Character of Wound/Ulcer Post Stable Debridement: Severity of Tissue Post Debridement: Limited to breakdown of skin Post Procedure Diagnosis Same as Pre-procedure Electronic Signature(s) Signed: 09/19/2018 5:03:58 PM By: Gretta Cool, BSN, RN, CWS, Kim RN, BSN Signed: 09/19/2018 5:08:41 PM By: Worthy Keeler PA-C Entered By: Gretta Cool, BSN, RN, CWS, Kim on 09/19/2018 13:01:16 Samek, [name] ([id number]) -------------------------------------------------------------------------------- Debridement Details Patient Name: [name] Date of Service: 09/19/2018 12:45 PM Medical Record Number: [id number] Patient Account Number: [id number] Date of Birth/Sex: [birth year] (84 y.o. F) Treating RN: Cornell Barman Primary Care Provider: Grayland Ormond Other Clinician: Referring Provider: Grayland Ormond Treating Provider/Extender: Melburn Hake, HOYT Weeks in Treatment: 68 Debridement Performed for Wound #2 Left,Lateral Malleolus Assessment: Performed By: Physician STONE III, HOYT E., PA-C Debridement Type: Debridement Severity of Tissue Pre Limited to breakdown of skin Debridement: Level of Consciousness (Pre- Awake and Alert procedure): Pre-procedure Verification/Time Yes - 12:55 Out Taken: Start Time: 12:56 Pain Control: Other : lidocaine 4% Total Area Debrided (L x W): 1.5 (cm) x 1.2 (cm) = 1.8 (cm) Tissue and other material Viable, Slough, Subcutaneous, Skin: Epidermis, Slough debrided: Level: Skin/Subcutaneous Tissue Debridement Description: Excisional Instrument: Curette Bleeding: Minimum Hemostasis Achieved: Pressure End Time: 13:00 Procedural Pain: 0 Post Procedural Pain: 2 Response to Treatment: Procedure was  tolerated well Level of Consciousness Awake and Alert (Post-procedure): Post Debridement Measurements of Total Wound Length: (cm) 1.2 Stage: Category/Stage IV Width: (cm) 1.5 Depth: (cm) 0.5 Volume: (cm) 0.707 Character of Wound/Ulcer Post Stable Debridement: Severity of Tissue Post Debridement: Fat layer exposed Post Procedure Diagnosis Same as Pre-procedure Electronic Signature(s) Signed: 09/19/2018 5:03:58 PM By: Gretta Cool, BSN, RN, CWS, Kim RN, BSN Signed: 09/19/2018 5:08:41 PM By: Worthy Keeler PA-C Entered By: Gretta Cool, BSN, RN, CWS, Kim on 09/19/2018 13:03:11 [place], [name] ([id number]) -------------------------------------------------------------------------------- HPI Details Patient Name: [name],  [name]. Date of Service: 09/19/2018 12:45 PM Medical Record Number: [id number] Patient Account Number: [id number] Date of Birth/Sex: [birth year] (84 y.o. 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 Dr. Amalia Hailey the podiatrist saw her on 07/20/2017 and sent her to the wound center for possible hyperbaric oxygen therapy. past medical history of peripheral vascular disease, varicose veins, status post appendectomy, basal cell carcinoma excision from the left leg, cholecystectomy, pacemaker placement, right lower extremity angiography done by Dr. dew in March 2017 with placement of a stent. there is also note of a successful ablation of the right small saphenous vein done which was reviewed by ultrasound on 10/24/2016. the patient had a right small saphenous vein ablation done on 10/20/2016. The  patient has never been a smoker. She has been seen by Dr. Corene Cornea dew the vascular surgeon who most recently saw her on 06/15/2017 for evaluation of ongoing problems with right leg swelling. She had a lower extremity arterial duplex examination done(02/13/17) which showed patent distal right superficial femoral artery stent and above-the-knee popliteal stent without evidence of restenosis. The ABI was more than 1.3 on the right and more than 1.3 on the left. This was consistent with noncompressible arteries due to medial calcification. The right great toe pressure and PPG waveforms are within normal limits and the left great toe pressure and PPG waveforms are decreased. he recommended she continue to wear her compression stockings and continue with elevation. She is scheduled to have a noninvasive arterial study in the near future 08/16/2017 -- had a lower extremity arterial duplex examination done which showed patent distal right superficial femoral artery stent and above-the-knee popliteal stent without evidence of restenosis. The ABI was more than 1.3 on the right and more than 1.3 on the left. This was consistent with noncompressible arteries due to medial calcification. The right great toe pressure and PPG waveforms are within normal limits and the left great toe pressure and PPG waveforms are decreased. the x-ray of the right ankle has not yet been done 08/24/2017 -- had a right ankle x-ray -- IMPRESSION:1. No fracture, bone lesion or evidence of osteomyelitis. 2. Lateral soft tissue swelling with a soft tissue ulcer. she has not yet seen the vascular surgeon for review 08/31/17 on evaluation today patient's wound appears to be showing signs of improvement. She still with her appointment with vascular in order to review her results of her vascular study and then determine if any intervention would be recommended at that time. No fevers, chills, nausea, or vomiting noted at this time. She has  been tolerating the dressing changes without complication. 09/28/17 on evaluation today patient's wound appears to show signs of good improvement in regard to the granulation tissue which is surfacing. There is still a layer of slough covering the wound and the posterior portion is still significantly deeper than the anterior nonetheless there has been some good sign of things moving towards the better. She is going to go back to Dr. dew for reevaluation to ensure her blood flow is still appropriate. That will be before her next evaluation with [place] next week. No fevers, chills, nausea, or vomiting noted at this time. Patient does have some discomfort rated  to be a 3-4/10 depending on activity specifically cleansing the wound makes it worse. 10/05/2017 -- the patient was seen by Dr. Lucky Cowboy last week and noninvasive studies showed a normal right ABI with brisk triphasic waveforms consistent with no arterial insufficiency including normal digital pressures. The duplex showed a patent distal right SFA stent and the proximal SFA was also normal. He was pleased with her test and thought she should have enough of perfusion for normal wound healing. He would see her back in 6 months time. 12/21/17 on evaluation today patient appears to be doing fairly well in regard to her right lateral ankle wound. Unfortunately the main issue that she is expansion at this point is that she is having some issues with what appears to be some cellulitis in the JUDI, JAFFE. ([id number]) right anterior shin. She has also been noting a little bit of uncomfortable feeling especially last night and her ankle area. I'm afraid that she made the developing a little bit of an infection. With that being said I think it is in the early stages. 12/28/17 on evaluation today patient's ankle appears to be doing excellent. She's making good progress at this point the cellulitis seems to have improved after last week's evaluation. Overall  she is having no significant discomfort which is excellent news. She does have an appointment with Dr. dew on March 29, 2018 for reevaluation in regard to the stent he placed. She seems to have excellent blood flow in the right lower extremity. 01/19/12 on evaluation today patient's wound appears to be doing very well. In fact she does not appear to require debridement at this point, there's no evidence of infection, and overall from the standpoint of the wound she seems to be doing very well. With that being said I believe that it may be time to switch to different dressing away from the [facility] Dressing she tells me she does have a lot going on her friend actually passed away yesterday and she's also having a lot of issues with her husband this obviously is weighing heavy on her as far as your thoughts and concerns today. 01/25/18 on evaluation today patient appears to be doing fairly well in regard to her right lateral malleolus. She has been tolerating the dressing changes without complication. Overall I feel like this is definitely showing signs of improvement as far as how the overall appearance of the wound is there's also evidence of epithelium start to migrate over the granulation tissue. In general I think that she is progressing nicely as far as the wound is concerned. The only concern she really has is whether or not we can switch to every other week visits in order to avoid having as many appointments as her daughters have a difficult time getting her to her appointments as well as the patient's husband to his he is not doing very well at this point. 02/22/18 on evaluation today patient's right lateral malleolus ulcer appears to be doing great. She has been tolerating the dressing changes without complication. Overall you making excellent progress at this time. Patient is having no significant discomfort. 03/15/18 on evaluation today patient appears to be doing much more poorly in  regard to her right lateral ankle ulcer at this point. Unfortunately since have last seen her her husband has passed just a few days ago is obviously weighed heavily on her her daughter also had surgery well she is with her today as usual. There does not appear to be any  evidence of infection she does seem to have significant contusion/deep tissue injury to the right lateral malleolus which was not noted previous when I saw her last. It's hard to tell of exactly when this injury occurred although during the time she was spending the night in the hospital this may have been most likely. 03/22/18 on evaluation today patient appears to actually be doing very well in regard to her ulcer. She did unfortunately have a setback which was noted last week however the good news is we seem to be getting back on track and in fact the wound in the core did still have some necrotic tissue which will be addressed at this point today but in general I'm seeing signs that things are on the up and up. She is glad to hear this obviously she's been somewhat concerned that due to the how her wound digressed more recently. 03/29/18 on evaluation today patient appears to be doing fairly well in regard to her right lower extremity lateral malleolus ulcer. She unfortunately does have a new area of pressure injury over the inferior portion where the wound has opened up a little bit larger secondary to the pressure she seems to be getting. She does tell me sometimes when she sleeps at night that it actually hurts and does seem to be pushing on the area little bit more unfortunately. There does not appear to be any evidence of infection which is good news. She has been tolerating the dressing changes without complication. She also did have some bruising in the left second and third toes due to the fact that she may have bump this or injured it although she has neuropathy so she does not feel she did move recently that may have  been where this came from. Nonetheless there does not appear to be any evidence of infection at this time. 04/12/18 on evaluation today patient's wound on the right lateral ankle actually appears to be doing a little bit better with a lot of necrotic docking tissue centrally loosening up in clearing away. However she does have the beginnings of a deep tissue injury on the left lateral malleolus likely due to the fact we've been trying offload the right as much as we have. I think she may benefit from an assistive soft device to help with offloading and it looks like they're looking at one of the doughnut conditions that wraps around the lower leg to offload which I think will definitely do a good job. With that being said I think we definitely need to address this issue on the left before it becomes a wound. Patient is not having significant pain. 04/19/18 on evaluation today patient appears to be doing excellent in regard to the progress she's made with her right lateral ankle ulcer. The left ankle region which did show evidence of a deep tissue injury seems to be resolving there's little fluid noted underneath and a blister there's nothing open at this point in time overall I feel like this is progressing nicely which is good news. She does not seem to be having significant discomfort at this point which is also good news. 04/25/18-She is here in follow up evaluation for bilateral lateral malleolar ulcers. The right lateral malleolus ulcer with pale subcutaneous tissue exposure, central area of ulcer with tendon/periosteum exposed. The left lateral malleolus ulcer now with Rosenzweig, [name] ([id number]) central area of nonviable tissue, otherwise deep tissue injury. She is wearing compression wraps to the left lower extremity, she will  place the right lower extremity compression wraps on when she gets home. She will be out of town over the weekend and return next week and follow-up appointment. She  completed her doxycycline this morning 05/03/18 on evaluation today patient appears to be doing very well in regard to her right lateral ankle ulcer in general. At least she's showing some signs of improvement in this regard. Unfortunately she has some additional injury to the left lateral malleolus region which appears to be new likely even over the past several days. Again this determination is based on the overall appearance. With that being said the patient is obviously frustrated about this currently. 05/10/18-She is here in follow-up evaluation for bilateral lateral malleolar ulcers. She states she has purchased offloading shoes/boots and they will arrive tomorrow. She was asked to bring them in the office at next week's appointment so her provider is aware of product being utilized. She continues to sleep on right or left side, she has been encouraged to sleep on her back. The right lateral malleolus ulcer is precariously close to peri-osteum; will order xray. The left lateral malleolus ulcer is improved. Will switch back to santyl; she will follow up next week. 05/17/18 on evaluation today patient actually appears to be doing very well in regard to her malleolus her ulcers compared to last time I saw them. She does not seem to have as much in the way of contusion at this point which is great news. With that being said she does continue to have discomfort and I do believe that she is still continuing to benefit from the offloading/pressure reducing boots that were recommended. I think this is the key to trying to get this to heal up completely. 05/24/18 on evaluation today patient actually appears to be doing worse at this point in time unfortunately compared to her last week's evaluation. She is having really no increased pain which is good news unfortunately she does have more maceration in your theme and noted surrounding the right lateral ankle the left lateral ankle is not really is  erythematous I do not see signs of the overt cellulitis on that side. Unfortunately the wounds do not seem to have shown any signs of improvement since the last evaluation. She also has significant swelling especially on the right compared to previous some of this may be due to infection however also think that she may be served better while she has these wounds by compression wrapping versus continuing to use the Juxta-Lite for the time being. Especially with the amount of drainage that she is experiencing at this point. No fevers, chills, nausea, or vomiting noted at this time. 05/31/18 on evaluation today patient appears to actually be doing better in regard to her right lateral lower extremity ulcer specifically on the malleolus region. She has been tolerating the antibiotic without complication. With that being said she still continues to have issues but a little bit of redness although nothing like she what she was experiencing previous. She still continues to pressure to her ankle area she did get the problem on offloading boots unfortunately she will not wear them she states there too uncomfortable and she can't get in and out of the bed. Nonetheless at this point her wounds seem to be continually getting worse which is not what we want I'm getting somewhat concerned about her progress and how things are going to proceed if we do not intervene in some way shape or form. I therefore had a very lengthy  conversation today about offloading yet again and even made a specific suggestion for switching her to a memory foam mattress and even gave the information for a specific one that they could look at getting if it was something that they were interested in considering. She does not want to be considered for a hospital bed air mattress although honestly insurance would not cover it that she does not have any wounds on her trunk. 06/14/18 on evaluation today both wounds over the bilateral lateral  malleolus her ulcers appear to be doing better there's no evidence of pressure injury at this point. She did get the foam mattress for her bed and this does seem to have been extremely beneficial for her in my pinion. Her daughter states that she is having difficulty getting out of bed because of how soft it is. The patient also relates this to be. Nonetheless I do feel like she's actually doing better. Unfortunately right after and around the time she was getting the mattress she also sustained a fall when she got up to go pick up the phone and ended up injuring her right elbow she has [address] sut[address] place. We are not caring for this currently although home health is going to be taking the sutures out shortly. Nonetheless this may be something that we need to evaluate going forward. It depends on how well it has or has not healed in the end. She also recently saw an orthopedic specialist for an injection in the right shoulder just before her fall unfortunately the fall seems to have worsened her pain. 06/21/18 on evaluation today patient appears to be doing about the same in regard to her lateral malleolus ulcers. Both appear to be just a little bit deeper but again we are clinging away the necrotic and dead tissue which I think is why this is progressing towards a deeper realm as opposed to improving from my measurement standpoint in that regard. Nonetheless she has been tolerating the dressing changes she absolutely hates the memory foam mattress topper that was obtained for her nonetheless I do believe this is still doing excellent as far as taking care of excess pressure in regard to the lateral malleolus regions. She in fact has no pressure injury that I see whereas in weeks past it was week by week I was constantly seeing new pressure injuries. Overall I think it has been very beneficial for her. 07/03/18; patient arrives in my clinic today. She has deep punched out areas over her bilateral  lateral malleoli. The area on the right has some more depth. JOAQUINA, NISSEN ([id number]) We spent a lot of time today talking about pressure relief for these areas. This started when her daughter asked for a prescription for a memory foam mattress. I have never written a prescription for a mattress and I don't think insurances would pay for that on an ordinary bed. In any case he came up that she has foam boots that she refuses to wear. I would suggest going to these before any other offloading issues when she is in bed. They say she is meticulous about offloading this the rest of the day 07/10/18- She is seen in follow-up evaluation for bilateral, lateral malleolus ulcers. There is no improvement in the ulcers. She has purchased and is sleeping on a memory foam mattress/overlay, she has been using the offloading boots nightly over the past week. She has a follow up appointment with vascular medicine at the end of October, in my  opinion this follow up should be expedited given her deterioration and suboptimal TBI results. We will order plain film xray of the left ankle as deeper structures are palpable; would consider having MRI, regardless of xray report(s). The ulcers will be treated with iodoflex/iodosorb, she is unable to safely change the dressings daily with santyl. 07/19/18 on evaluation today patient appears to be doing in general visually well in regard to her bilateral lateral malleolus ulcers. She has been tolerating the dressing changes without complication which is good news. With that being said we did have an x-ray performed on 07/12/18 which revealed a slight loosen see in the lateral portion of the distal left fibula which may represent artifact but underline lytic destruction or osteomyelitis could not be excluded. MRI was recommended. With that being said we can see about getting the patient scheduled for an MRI to further evaluate this area. In fact we have that scheduled  currently for August 20 19,019. 07/26/18 on evaluation today patient's wound on the right lateral ankle actually appears to be doing fairly well at this point in my pinion. She has made some good progress currently. With that being said unfortunately in regard to the left lateral ankle ulcer this seems to be a little bit more problematic at this time. In fact as I further evaluated the situation she actually had bone exposed which is the first time that's been the case in the bone appear to be necrotic. Currently I did review patient's note from Dr. Bunnie Domino office with [facility]. He stated that ABI was 1.26 on the right and 0.95 on the left with good waveforms. Her perfusion is stable not reduced from previous studies and her digital waveforms were pretty good particularly on the right. His conclusion upon review of the note was that there was not much she could do to improve her perfusion and he felt she was adequate for wound healing. His suggestion was that she continued to see [place] and consider a synthetic skin graft if there was no underlying infection. He plans to see her back in six months or as needed. 08/01/18 on evaluation today patient appears to be doing better in regard to her right lateral ankle ulcer. Her left lateral ankle ulcer is about the same she still has bone involvement in evidence of necrosis. There does not appear to be evidence of infection at this time On the right lateral lower extremity. I have started her on the Augmentin she picked this up and started this yesterday. This is to get her through until she sees infectious disease which is scheduled for 08/12/18. 08/06/18 on evaluation today patient appears to be doing rather well considering my discussion with patient's daughter at the end of last week. The area which was marked where she had erythema seems to be improved and this is good news. With that being said overall the patient seems to be making  good improvement when it comes to the overall appearance of the right lateral ankle ulcer although this has been slow she at least is coming around in this regard. Unfortunately in regard to the left lateral ankle ulcer this is osteomyelitis based on the pathology report as well is bone culture. Nonetheless we are still waiting CT scan. Unfortunately the MRI we originally ordered cannot be performed as the patient is a pacemaker which I had overlooked. Nonetheless we are working on the CT scan approval and scheduling as of now. She did go to the hospital  over the weekend and was placed on IV Cefzo for a couple of days. Fortunately this seems to have improved the erythema quite significantly which is good news. There does not appear to be any evidence of worsening infection at this time. She did have some bleeding after the last debridement therefore I did not perform any sharp debridement in regard to left lateral ankle at this point. Patient has been approved for a snap vac for the right lateral ankle. 08/14/18; the patient with wounds over her bilateral lateral malleoli. The area on the right actually looks quite good. Been using a snap back on this area. Healthy granulation and appears to be filling in. Unfortunately the area on the left is really problematic. 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 Also noted to have cellulitis. She saw Dr. Novella Olive of infectious disease today and was put on linezolid. We are able to verify this with her pharmacy. She is completed the Augmentin that she was already on. We've been using Iodoflex to this area 08/23/18 on evaluation today patient's wounds both actually appear to be doing better compared to my prior evaluations. Fortunately she showing signs of good improvement in regard to the overall wound status especially where were using the snap vac on the right. In regard to left  lateral malleolus the wound bed actually appears to be much cleaner than previously noted. I do not feel any phone directly probed during evaluation today and though there is tendon noted this does not appear [place], [name] ([id number]) to be necrotic it's actually fairly good as far as the overall appearance of the tendon is concerned. In general the wound bed actually appears to be doing significantly better than it was previous. Patient is currently in the care of [name] and I did review that note today. He actually has her on two weeks of linezolid and then following the patient will be on 1-2 months of Keflex. That is the plan currently. She has been on antibiotics therapy as prescribed by myself initially starting on July 30, 2018 and has been on that continuously up to this point. 08/30/18 on evaluation today patient actually appears to be doing much better in regard to her right lateral malleolus ulcer. She has been tolerating the dressing changes specifically the snap vac without complication although she did have some issues with the seal currently. Apparently there was some trouble with getting it to maintain over the past week past Sunday. Nonetheless overall the wound appears better in regard to the right lateral malleolus region. In regard to left lateral malleolus this actually show some signs of additional granulation although there still tendon noted in the base of the wound this appears to be healthy not necrotic in any way whatsoever. We are considering potentially using a snap vac for the left lateral malleolus as well the product wrap from [employer], [place], was present in the clinic today we're going to see this patient I did have her come in with me after obtaining consent from the patient and her daughter in order to look at the wound and see if there's any recommendation one way or another as to whether or not they felt the snapback could be beneficial for the left  lateral malleolus region. But the conclusion was that it might be but that this is definitely a little bit deeper wound than what traditionally would be utilized for a snap  vac. 09/06/18 on evaluation today patient actually appears to be doing excellent in my pinion in regard to both ankle ulcers. She has been tolerating the dressing changes without complication which is great news. Specifically we have been using the snap vac. In regard to the right ankle I'm not even sure that this is going to be necessary for today and following as the wound has filled in quite nicely. In regard to the left ankle I do believe that we're seeing excellent epithelialization from the edge as well as granulation in the central portion the tendon is still exposed but there's no evidence of necrotic bone and in general I feel like the patient has made excellent progress even compared to last week with just one week of the snap vac. 09/11/18; this is a patient who has wounds on her bilateral lateral malleoli. Initially both of these were deep stage IV wounds in the setting of chronic arterial insufficiency. She has been revascularized. As I understand think she been using snap vacs to both of these wounds however the area on the right became more superficial and currently she is only using it on the left. Using silver collagen on the right and silver collagen under the back on the left I believe 09/19/18 on evaluation today patient actually appears to be doing very well in regard to her lateral malleolus or ulcers bilaterally. She has been tolerating the dressing changes without complication. Fortunately there does not appear to be any evidence of infection at this time. Overall I feel like she is improving in an excellent manner and I'm very pleased with the fact that everything seems to be turning towards the better for her. This has obviously been a long road. Electronic Signature(s) Signed: 09/19/2018 5:08:41 PM By:  Worthy Keeler PA-C Entered By: Worthy Keeler on 09/19/2018 14:03:28 [name] ([id number]) -------------------------------------------------------------------------------- Physical Exam Details Patient Name: [name] Date of Service: 09/19/2018 12:45 PM Medical Record Number: [id number] Patient Account Number: [id number] Date of Birth/Sex: 01-01-1923 (84 y.o. F) Treating RN: Cornell Barman Primary Care Provider: Grayland Ormond Other Clinician: Referring Provider: Grayland Ormond Treating Provider/Extender: Melburn Hake, HOYT Weeks in Treatment: 69 Constitutional Well-nourished and well-hydrated in no acute distress. Respiratory normal breathing without difficulty. Psychiatric this patient is able to make decisions and demonstrates good insight into disease process. Alert and Oriented x 3. pleasant and cooperative. Notes Patient's wound beds currently show good granulation. The right lateral malleolus is doing very well with great epithelialization around the edge. In regard to the left lateral malleolus this is starting to fill in and does seem to be much better compared to previous. Overall I'm very happy with how things have gone over the past several weeks. Electronic Signature(s) Signed: 09/19/2018 5:08:41 PM By: Worthy Keeler PA-C Entered By: Worthy Keeler on 09/19/2018 14:04:23 Muckle, [name] ([id number]) -------------------------------------------------------------------------------- Physician Orders Details Patient Name: [name] Date of Service: 09/19/2018 12:45 PM Medical Record Number: [id number] Patient Account Number: [id number] Date of Birth/Sex: May 22, 1923 (84 y.o. F) Treating RN: Cornell Barman Primary Care Provider: Grayland Ormond Other Clinician: Referring Provider: Grayland Ormond Treating Provider/Extender: Melburn Hake, HOYT Weeks in Treatment: 81 Verbal / Phone Orders: No Diagnosis Coding Wound Cleansing Wound #1  Right,Lateral Malleolus o Clean wound with Normal Saline. o Cleanse wound with mild soap and water o May Shower, gently pat wound dry prior to applying new dressing. Wound #2 Left,Lateral Malleolus o Clean wound with Normal Saline. o Cleanse wound with  mild soap and water o May Shower, gently pat wound dry prior to applying new dressing. Anesthetic (add to Medication List) Wound #1 Right,Lateral Malleolus o Topical Lidocaine 4% cream applied to wound bed prior to debridement (In Clinic Only). Wound #2 Left,Lateral Malleolus o Topical Lidocaine 4% cream applied to wound bed prior to debridement (In Clinic Only). Primary Wound Dressing Wound #1 Right,Lateral Malleolus o Silver Collagen Wound #2 Left,Lateral Malleolus o Silver Collagen Secondary Dressing Wound #1 Right,Lateral Malleolus o Boardered Foam Dressing o Drawtex Dressing Change Frequency Wound #1 Right,Lateral Malleolus o Change Dressing Monday, Wednesday, Friday Follow-up Appointments Wound #1 Right,Lateral Malleolus o Return Appointment in 1 week. Wound #2 Left,Lateral Malleolus o Return Appointment in 1 week. Edema Control LIZABETH, FELLNER ([id number]) Wound #1 Right,Lateral Malleolus o Patient to wear own compression stockings o Patient to wear own Velcro compression garment. o Elevate legs to the level of the heart and pump ankles as often as possible Wound #2 Left,Lateral Malleolus o Patient to wear own compression stockings o Patient to wear own Velcro compression garment. o Elevate legs to the level of the heart and pump ankles as often as possible Off-Loading Wound #1 Right,Lateral Malleolus o Turn and reposition every 2 hours o Other: - do not put pressure on your ankles Wound #2 Left,Lateral Malleolus o Turn and reposition every 2 hours o Other: - do not put pressure on your ankles Additional Orders / Instructions Wound #1 Right,Lateral  Malleolus o Increase protein intake. Wound #2 Left,Lateral Malleolus o Increase protein intake. Home Health Wound #1 [place] Visits - [place] Nurse may visit PRN to address patientos wound care needs. o FACE TO FACE ENCOUNTER: MEDICARE and MEDICAID PATIENTS: I certify that this patient is under my care and that I had a face-to-face encounter that meets the physician face-to-face encounter requirements with this patient on this date. The encounter with the patient was in whole or in part for the following MEDICAL CONDITION: (primary reason for [place]) MEDICAL NECESSITY: I certify, that based on my findings, NURSING services are a medically necessary home health service. HOME BOUND STATUS: I certify that my clinical findings support that this patient is homebound (i.e., Due to illness or injury, pt requires aid of supportive devices such as crutches, cane, wheelchairs, walkers, the use of special transportation or the assistance of another person to leave their place of residence. There is a normal inability to leave the home and doing so requires considerable and taxing effort. Other absences are for medical reasons / religious services and are infrequent or of short duration when for other reasons). o If current dressing causes regression in wound condition, may D/C ordered dressing product/s and apply Normal Saline Moist Dressing daily until next [place] / Other MD appointment. [place] of regression in wound condition at [phone]. o Please direct any NON-WOUND related issues/requests for orders to patient's Primary Care Physician Wound #2 [place] Visits - [place] Nurse may visit PRN to address patientos wound care needs. o FACE TO FACE ENCOUNTER: MEDICARE and MEDICAID PATIENTS: I certify that this patient is under my  care and that I had a face-to-face encounter that meets the physician face-to-face encounter requirements with this patient on this date. The encounter with the patient was in whole or in part for the following MEDICAL CONDITION: (primary reason for [place]) MEDICAL NECESSITY: I certify, that based on my  findings, NURSING services are a medically necessary home health service. HOME BOUND STATUS: I certify that my clinical findings support that this patient is homebound (i.e., Due to illness or injury, pt requires aid of supportive devices such as crutches, cane, wheelchairs, walkers, the use of special transportation or the assistance of another person to leave their place of residence. There is a normal inability to leave the home RONIKA, KELSON. ([id number]) and doing so requires considerable and taxing effort. Other absences are for medical reasons / religious services and are infrequent or of short duration when for other reasons). o If current dressing causes regression in wound condition, may D/C ordered dressing product/s and apply Normal Saline Moist Dressing daily until next [place] / Other MD appointment. [place] of regression in wound condition at [phone]. o Please direct any NON-WOUND related issues/requests for orders to patient's Primary Care Physician Negative Pressure Wound Therapy Wound #2 Left,Lateral Malleolus o Other: - SNAP VAC Electronic Signature(s) Signed: 09/19/2018 5:03:58 PM By: Gretta Cool, BSN, RN, CWS, Kim RN, BSN Signed: 09/19/2018 5:08:41 PM By: Worthy Keeler PA-C Entered By: Gretta Cool, BSN, RN, CWS, Kim on 09/19/2018 13:04:51 [place], [name] ([id number]) -------------------------------------------------------------------------------- Problem List Details Patient Name: LYNEA, ROLLISON Date of Service: 09/19/2018 12:45 PM Medical Record Number: [id number] Patient Account Number: [id number] Date of  Birth/Sex: 1923-11-04 (84 y.o. F) Treating RN: Cornell Barman Primary Care Provider: Grayland Ormond Other Clinician: Referring Provider: Grayland Ormond Treating Provider/Extender: Melburn Hake, HOYT Weeks in Treatment: 64 Active Problems ICD-10 Evaluated Encounter Code Description Active Date Today Diagnosis 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, stage 4 05/10/2018 No Yes L89.524 Pressure ulcer of left ankle, stage 4 04/25/2018 No Yes I70.233 Atherosclerosis of native arteries of right leg with ulceration of 08/10/2017 No Yes ankle I70.243 Atherosclerosis of native arteries of left leg with ulceration of 07/10/2018 No Yes ankle I89.0 Lymphedema, not elsewhere classified 08/10/2017 No Yes B35.4 Tinea corporis 09/28/2017 No Yes 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 F) Treating RN: Cornell Barman Primary Care Provider: Grayland Ormond Other Clinician: Referring Provider: Grayland Ormond Treating Provider/Extender: Melburn Hake, HOYT Weeks in Treatment: 36 Subjective Chief Complaint Information obtained from Patient Patient is here for right lateral malleolus and left lateral malleolus ulcer History of Present Illness (HPI) 84 year old patient who most recently has been seeing both podiatry and vascular surgery for a long-standing ulcer of her right lateral malleolus which has been treated with various methodologies. Dr. Amalia Hailey the podiatrist saw her on 07/20/2017 and sent her to the wound  center for possible hyperbaric oxygen therapy. past medical history of peripheral vascular disease, varicose veins, status post appendectomy, basal cell carcinoma excision from the left leg, cholecystectomy, pacemaker placement, right lower extremity angiography done by Dr. dew in March 2017 with placement of a stent. there is also note of a successful ablation of the right small saphenous vein done which was reviewed by ultrasound on 10/24/2016. the patient had a right small saphenous vein ablation done on 10/20/2016. The patient has never been a smoker. She has been seen by Dr. Corene Cornea dew the vascular surgeon who most recently saw her  on 06/15/2017 for evaluation of ongoing problems with right leg swelling. She had a lower extremity arterial duplex examination done(02/13/17) which showed patent distal right superficial femoral artery stent and above-the-knee popliteal stent without evidence of restenosis. The ABI was more than 1.3 on the right and more than 1.3 on the left. This was consistent with noncompressible arteries due to medial calcification. The right great toe pressure and PPG waveforms are within normal limits and the left great toe pressure and PPG waveforms are decreased. he recommended she continue to wear her compression stockings and continue with elevation. She is scheduled to have a noninvasive arterial study in the near future 08/16/2017 -- had a lower extremity arterial duplex examination done which showed patent distal right superficial femoral artery stent and above-the-knee popliteal stent without evidence of restenosis. The ABI was more than 1.3 on the right and more than 1.3 on the left. This was consistent with noncompressible arteries due to medial calcification. The right great toe pressure and PPG waveforms are within normal limits and the left great toe pressure and PPG waveforms are decreased. the x-ray of the right ankle has not yet been done 08/24/2017 -- had a  right ankle x-ray -- IMPRESSION:1. No fracture, bone lesion or evidence of osteomyelitis. 2. Lateral soft tissue swelling with a soft tissue ulcer. she has not yet seen the vascular surgeon for review 08/31/17 on evaluation today patient's wound appears to be showing signs of improvement. She still with her appointment with vascular in order to review her results of her vascular study and then determine if any intervention would be recommended at that time. No fevers, chills, nausea, or vomiting noted at this time. She has been tolerating the dressing changes without complication. 09/28/17 on evaluation today patient's wound appears to show signs of good improvement in regard to the granulation tissue which is surfacing. There is still a layer of slough covering the wound and the posterior portion is still significantly deeper than the anterior nonetheless there has been some good sign of things moving towards the better. She is going to go back to Dr. dew for reevaluation to ensure her blood flow is still appropriate. That will be before her next evaluation with [place] next week. No fevers, chills, nausea, or vomiting noted at this time. Patient does have some discomfort rated to be a 3-4/10 depending on activity specifically cleansing the wound makes it worse. 10/05/2017 -- the patient was seen by Dr. Lucky Cowboy last week and noninvasive studies showed a normal right ABI with brisk CHRISSA, MEETZE. ([id number]) triphasic waveforms consistent with no arterial insufficiency including normal digital pressures. The duplex showed a patent distal right SFA stent and the proximal SFA was also normal. He was pleased with her test and thought she should have enough of perfusion for normal wound healing. He would see her back in 6 months time. 12/21/17 on evaluation today patient appears to be doing fairly well in regard to her right lateral ankle wound. Unfortunately the main issue that she is expansion at this  point is that she is having some issues with what appears to be some cellulitis in the right anterior shin. She has also been noting a little bit of uncomfortable feeling especially last night and her ankle area. I'm afraid that she made the developing a little bit of an infection. With that being said I think it is in the early stages. 12/28/17 on evaluation today patient's ankle appears to be doing excellent. She's making good  progress at this point the cellulitis seems to have improved after last week's evaluation. Overall she is having no significant discomfort which is excellent news. She does have an appointment with Dr. dew on March 29, 2018 for reevaluation in regard to the stent he placed. She seems to have excellent blood flow in the right lower extremity. 01/19/12 on evaluation today patient's wound appears to be doing very well. In fact she does not appear to require debridement at this point, there's no evidence of infection, and overall from the standpoint of the wound she seems to be doing very well. With that being said I believe that it may be time to switch to different dressing away from the [facility] Dressing she tells me she does have a lot going on her friend actually passed away yesterday and she's also having a lot of issues with her husband this obviously is weighing heavy on her as far as your thoughts and concerns today. 01/25/18 on evaluation today patient appears to be doing fairly well in regard to her right lateral malleolus. She has been tolerating the dressing changes without complication. Overall I feel like this is definitely showing signs of improvement as far as how the overall appearance of the wound is there's also evidence of epithelium start to migrate over the granulation tissue. In general I think that she is progressing nicely as far as the wound is concerned. The only concern she really has is whether or not we can switch to every other week visits in  order to avoid having as many appointments as her daughters have a difficult time getting her to her appointments as well as the patient's husband to his he is not doing very well at this point. 02/22/18 on evaluation today patient's right lateral malleolus ulcer appears to be doing great. She has been tolerating the dressing changes without complication. Overall you making excellent progress at this time. Patient is having no significant discomfort. 03/15/18 on evaluation today patient appears to be doing much more poorly in regard to her right lateral ankle ulcer at this point. Unfortunately since have last seen her her husband has passed just a few days ago is obviously weighed heavily on her her daughter also had surgery well she is with her today as usual. There does not appear to be any evidence of infection she does seem to have significant contusion/deep tissue injury to the right lateral malleolus which was not noted previous when I saw her last. It's hard to tell of exactly when this injury occurred although during the time she was spending the night in the hospital this may have been most likely. 03/22/18 on evaluation today patient appears to actually be doing very well in regard to her ulcer. She did unfortunately have a setback which was noted last week however the good news is we seem to be getting back on track and in fact the wound in the core did still have some necrotic tissue which will be addressed at this point today but in general I'm seeing signs that things are on the up and up. She is glad to hear this obviously she's been somewhat concerned that due to the how her wound digressed more recently. 03/29/18 on evaluation today patient appears to be doing fairly well in regard to her right lower extremity lateral malleolus ulcer. She unfortunately does have a new area of pressure injury over the inferior portion where the wound has opened up a little bit larger secondary to  the  pressure she seems to be getting. She does tell me sometimes when she sleeps at night that it actually hurts and does seem to be pushing on the area little bit more unfortunately. There does not appear to be any evidence of infection which is good news. She has been tolerating the dressing changes without complication. She also did have some bruising in the left second and third toes due to the fact that she may have bump this or injured it although she has neuropathy so she does not feel she did move recently that may have been where this came from. Nonetheless there does not appear to be any evidence of infection at this time. 04/12/18 on evaluation today patient's wound on the right lateral ankle actually appears to be doing a little bit better with a lot of necrotic docking tissue centrally loosening up in clearing away. However she does have the beginnings of a deep tissue injury on the left lateral malleolus likely due to the fact we've been trying offload the right as much as we have. I think she may benefit from an assistive soft device to help with offloading and it looks like they're looking at one of the doughnut conditions that wraps around the lower leg to offload which I think will definitely do a good job. With that being said I think we definitely need to address this issue on the left before it becomes a wound. Patient is not having significant pain. TANAISHA, PITTMAN ([id number]) 04/19/18 on evaluation today patient appears to be doing excellent in regard to the progress she's made with her right lateral ankle ulcer. The left ankle region which did show evidence of a deep tissue injury seems to be resolving there's little fluid noted underneath and a blister there's nothing open at this point in time overall I feel like this is progressing nicely which is good news. She does not seem to be having significant discomfort at this point which is also good news. 04/25/18-She is here in  follow up evaluation for bilateral lateral malleolar ulcers. The right lateral malleolus ulcer with pale subcutaneous tissue exposure, central area of ulcer with tendon/periosteum exposed. The left lateral malleolus ulcer now with central area of nonviable tissue, otherwise deep tissue injury. She is wearing compression wraps to the left lower extremity, she will place the right lower extremity compression wraps on when she gets home. She will be out of town over the weekend and return next week and follow-up appointment. She completed her doxycycline this morning 05/03/18 on evaluation today patient appears to be doing very well in regard to her right lateral ankle ulcer in general. At least she's showing some signs of improvement in this regard. Unfortunately she has some additional injury to the left lateral malleolus region which appears to be new likely even over the past several days. Again this determination is based on the overall appearance. With that being said the patient is obviously frustrated about this currently. 05/10/18-She is here in follow-up evaluation for bilateral lateral malleolar ulcers. She states she has purchased offloading shoes/boots and they will arrive tomorrow. She was asked to bring them in the office at next week's appointment so her provider is aware of product being utilized. She continues to sleep on right or left side, she has been encouraged to sleep on her back. The right lateral malleolus ulcer is precariously close to peri-osteum; will order xray. The left lateral malleolus ulcer is improved. Will switch back to santyl;  she will follow up next week. 05/17/18 on evaluation today patient actually appears to be doing very well in regard to her malleolus her ulcers compared to last time I saw them. She does not seem to have as much in the way of contusion at this point which is great news. With that being said she does continue to have discomfort and I do believe  that she is still continuing to benefit from the offloading/pressure reducing boots that were recommended. I think this is the key to trying to get this to heal up completely. 05/24/18 on evaluation today patient actually appears to be doing worse at this point in time unfortunately compared to her last week's evaluation. She is having really no increased pain which is good news unfortunately she does have more maceration in your theme and noted surrounding the right lateral ankle the left lateral ankle is not really is erythematous I do not see signs of the overt cellulitis on that side. Unfortunately the wounds do not seem to have shown any signs of improvement since the last evaluation. She also has significant swelling especially on the right compared to previous some of this may be due to infection however also think that she may be served better while she has these wounds by compression wrapping versus continuing to use the Juxta-Lite for the time being. Especially with the amount of drainage that she is experiencing at this point. No fevers, chills, nausea, or vomiting noted at this time. 05/31/18 on evaluation today patient appears to actually be doing better in regard to her right lateral lower extremity ulcer specifically on the malleolus region. She has been tolerating the antibiotic without complication. With that being said she still continues to have issues but a little bit of redness although nothing like she what she was experiencing previous. She still continues to pressure to her ankle area she did get the problem on offloading boots unfortunately she will not wear them she states there too uncomfortable and she can't get in and out of the bed. Nonetheless at this point her wounds seem to be continually getting worse which is not what we want I'm getting somewhat concerned about her progress and how things are going to proceed if we do not intervene in some way shape or form. I  therefore had a very lengthy conversation today about offloading yet again and even made a specific suggestion for switching her to a memory foam mattress and even gave the information for a specific one that they could look at getting if it was something that they were interested in considering. She does not want to be considered for a hospital bed air mattress although honestly insurance would not cover it that she does not have any wounds on her trunk. 06/14/18 on evaluation today both wounds over the bilateral lateral malleolus her ulcers appear to be doing better there's no evidence of pressure injury at this point. She did get the foam mattress for her bed and this does seem to have been extremely beneficial for her in my pinion. Her daughter states that she is having difficulty getting out of bed because of how soft it is. The patient also relates this to be. Nonetheless I do feel like she's actually doing better. Unfortunately right after and around the time she was getting the mattress she also sustained a fall when she got up to go pick up the phone and ended up injuring her right elbow she has 18 sutures in place.  We are not caring for this currently although home health is going to be taking the sutures out shortly. Nonetheless this may be something that we need to evaluate going forward. It depends on how well it has or has not healed in the end. She also recently saw an orthopedic specialist for an injection in the right shoulder just before her fall unfortunately the fall seems to have worsened her pain. 06/21/18 on evaluation today patient appears to be doing about the same in regard to her lateral malleolus ulcers. Both appear to be just a little bit deeper but again we are clinging away the necrotic and dead tissue which I think is why this is progressing towards a deeper realm as opposed to improving from my measurement standpoint in that regard. Nonetheless she has been tolerating  the dressing changes she absolutely hates the memory foam mattress topper that was obtained for her nonetheless Masako, Overall [name] ([id number]) I do believe this is still doing excellent as far as taking care of excess pressure in regard to the lateral malleolus regions. She in fact has no pressure injury that I see whereas in weeks past it was week by week I was constantly seeing new pressure injuries. Overall I think it has been very beneficial for her. 07/03/18; patient arrives in my clinic today. She has deep punched out areas over her bilateral lateral malleoli. The area on the right has some more depth. We spent a lot of time today talking about pressure relief for these areas. This started when her daughter asked for a prescription for a memory foam mattress. I have never written a prescription for a mattress and I don't think insurances would pay for that on an ordinary bed. In any case he came up that she has foam boots that she refuses to wear. I would suggest going to these before any other offloading issues when she is in bed. They say she is meticulous about offloading this the rest of the day 07/10/18- She is seen in follow-up evaluation for bilateral, lateral malleolus ulcers. There is no improvement in the ulcers. She has purchased and is sleeping on a memory foam mattress/overlay, she has been using the offloading boots nightly over the past week. She has a follow up appointment with vascular medicine at the end of October, in my opinion this follow up should be expedited given her deterioration and suboptimal TBI results. We will order plain film xray of the left ankle as deeper structures are palpable; would consider having MRI, regardless of xray report(s). The ulcers will be treated with iodoflex/iodosorb, she is unable to safely change the dressings daily with santyl. 07/19/18 on evaluation today patient appears to be doing in general visually well in regard to her bilateral  lateral malleolus ulcers. She has been tolerating the dressing changes without complication which is good news. With that being said we did have an x-ray performed on 07/12/18 which revealed a slight loosen see in the lateral portion of the distal left fibula which may represent artifact but underline lytic destruction or osteomyelitis could not be excluded. MRI was recommended. With that being said we can see about getting the patient scheduled for an MRI to further evaluate this area. In fact we have that scheduled currently for August 20 19,019. 07/26/18 on evaluation today patient's wound on the right lateral ankle actually appears to be doing fairly well at this point in my pinion. She has made some good progress currently. With that being said  unfortunately in regard to the left lateral ankle ulcer this seems to be a little bit more problematic at this time. In fact as I further evaluated the situation she actually had bone exposed which is the first time that's been the case in the bone appear to be necrotic. Currently I did review patient's note from Dr. Bunnie Domino office with [facility]. He stated that ABI was 1.26 on the right and 0.95 on the left with good waveforms. Her perfusion is stable not reduced from previous studies and her digital waveforms were pretty good particularly on the right. His conclusion upon review of the note was that there was not much she could do to improve her perfusion and he felt she was adequate for wound healing. His suggestion was that she continued to see [place] and consider a synthetic skin graft if there was no underlying infection. He plans to see her back in six months or as needed. 08/01/18 on evaluation today patient appears to be doing better in regard to her right lateral ankle ulcer. Her left lateral ankle ulcer is about the same she still has bone involvement in evidence of necrosis. There does not appear to be evidence of infection at  this time On the right lateral lower extremity. I have started her on the Augmentin she picked this up and started this yesterday. This is to get her through until she sees infectious disease which is scheduled for 08/12/18. 08/06/18 on evaluation today patient appears to be doing rather well considering my discussion with patient's daughter at the end of last week. The area which was marked where she had erythema seems to be improved and this is good news. With that being said overall the patient seems to be making good improvement when it comes to the overall appearance of the right lateral ankle ulcer although this has been slow she at least is coming around in this regard. Unfortunately in regard to the left lateral ankle ulcer this is osteomyelitis based on the pathology report as well is bone culture. Nonetheless we are still waiting CT scan. Unfortunately the MRI we originally ordered cannot be performed as the patient is a pacemaker which I had overlooked. Nonetheless we are working on the CT scan approval and scheduling as of now. She did go to the hospital over the weekend and was placed on IV Cefzo for a couple of days. Fortunately this seems to have improved the erythema quite significantly which is good news. There does not appear to be any evidence of worsening infection at this time. She did have some bleeding after the last debridement therefore I did not perform any sharp debridement in regard to left lateral ankle at this point. Patient has been approved for a snap vac for the right lateral ankle. 08/14/18; the patient with wounds over her bilateral lateral malleoli. The area on the right actually looks quite good. Been using a snap back on this area. Healthy granulation and appears to be filling in. Unfortunately the area on the left is really problematic. 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 Also  noted to have cellulitis. She saw Dr. Novella Olive of infectious disease today and was put on linezolid. We are able to verify this with her pharmacy. She is completed the Augmentin that she was already on. We've been using Iodoflex to this SHELI, DORIN ([id number]) area 08/23/18 on evaluation today patient's  wounds both actually appear to be doing better compared to my prior evaluations. Fortunately she showing signs of good improvement in regard to the overall wound status especially where were using the snap vac on the right. In regard to left lateral malleolus the wound bed actually appears to be much cleaner than previously noted. I do not feel any phone directly probed during evaluation today and though there is tendon noted this does not appear to be necrotic it's actually fairly good as far as the overall appearance of the tendon is concerned. In general the wound bed actually appears to be doing significantly better than it was previous. Patient is currently in the care of [name] and I did review that note today. He actually has her on two weeks of linezolid and then following the patient will be on 1-2 months of Keflex. That is the plan currently. She has been on antibiotics therapy as prescribed by myself initially starting on July 30, 2018 and has been on that continuously up to this point. 08/30/18 on evaluation today patient actually appears to be doing much better in regard to her right lateral malleolus ulcer. She has been tolerating the dressing changes specifically the snap vac without complication although she did have some issues with the seal currently. Apparently there was some trouble with getting it to maintain over the past week past Sunday. Nonetheless overall the wound appears better in regard to the right lateral malleolus region. In regard to left lateral malleolus this actually show some signs of additional granulation although there still tendon noted in the base of  the wound this appears to be healthy not necrotic in any way whatsoever. We are considering potentially using a snap vac for the left lateral malleolus as well the product wrap from [employer], [place], was present in the clinic today we're going to see this patient I did have her come in with me after obtaining consent from the patient and her daughter in order to look at the wound and see if there's any recommendation one way or another as to whether or not they felt the snapback could be beneficial for the left lateral malleolus region. But the conclusion was that it might be but that this is definitely a little bit deeper wound than what traditionally would be utilized for a snap vac. 09/06/18 on evaluation today patient actually appears to be doing excellent in my pinion in regard to both ankle ulcers. She has been tolerating the dressing changes without complication which is great news. Specifically we have been using the snap vac. In regard to the right ankle I'm not even sure that this is going to be necessary for today and following as the wound has filled in quite nicely. In regard to the left ankle I do believe that we're seeing excellent epithelialization from the edge as well as granulation in the central portion the tendon is still exposed but there's no evidence of necrotic bone and in general I feel like the patient has made excellent progress even compared to last week with just one week of the snap vac. 09/11/18; this is a patient who has wounds on her bilateral lateral malleoli. Initially both of these were deep stage IV wounds in the setting of chronic arterial insufficiency. She has been revascularized. As I understand think she been using snap vacs to both of these wounds however the area on the right became more superficial and currently she is only using it on the left. Using  silver collagen on the right and silver collagen under the back on the left I believe 09/19/18 on evaluation  today patient actually appears to be doing very well in regard to her lateral malleolus or ulcers bilaterally. She has been tolerating the dressing changes without complication. Fortunately there does not appear to be any evidence of infection at this time. Overall I feel like she is improving in an excellent manner and I'm very pleased with the fact that everything seems to be turning towards the better for her. This has obviously been a long road. Patient History Information obtained from Patient. Family History Cancer - Father,Siblings, Heart Disease - Siblings, No family history of Diabetes, Hereditary Spherocytosis, Hypertension, Kidney Disease, Lung Disease, Seizures, Stroke, Thyroid Problems, Tuberculosis. Social History Never smoker, Marital Status - Married, Alcohol Use - Never, Drug Use - No History, Caffeine Use - Rarely. Review of Systems (ROS) Constitutional Symptoms (General Health) Denies complaints or symptoms of Fever, Chills. Respiratory The patient has no complaints or symptoms. Cardiovascular CHIDINMA, CLITES ([id number]) The patient has no complaints or symptoms. Psychiatric The patient has no complaints or symptoms. Objective Constitutional Well-nourished and well-hydrated in no acute distress. Vitals Time Taken: 12:42 PM, Height: 65 in, Weight: 154.3 lbs, BMI: 25.7, Temperature: 97.9 F, Pulse: 86 bpm, Respiratory Rate: 16 breaths/min, Blood Pressure: 150/74 mmHg. Respiratory normal breathing without difficulty. Psychiatric this patient is able to make decisions and demonstrates good insight into disease process. Alert and Oriented x 3. pleasant and cooperative. General Notes: Patient's wound beds currently show good granulation. The right lateral malleolus is doing very well with great epithelialization around the edge. In regard to the left lateral malleolus this is starting to fill in and does seem to be much better compared to previous. Overall I'm very  happy with how things have gone over the past several weeks. Integumentary (Hair, Skin) Wound #1 status is Open. Original cause of wound was Gradually Appeared. The wound is located on the Right,Lateral Malleolus. The wound measures 0.8cm length x 0.8cm width x 0.3cm depth; 0.503cm^2 area and 0.151cm^3 volume. There is Fat Layer (Subcutaneous Tissue) Exposed exposed. There is no tunneling or undermining noted. There is a small amount of serous drainage noted. The wound margin is distinct with the outline attached to the wound base. There is small (1-33%) pink granulation within the wound bed. There is a small (1-33%) amount of necrotic tissue within the wound bed including Adherent Slough. The periwound skin appearance exhibited: Maceration, Rubor. The periwound skin appearance did not exhibit: Callus, Crepitus, Excoriation, Induration, Rash, Scarring, Dry/Scaly, Atrophie Blanche, Cyanosis, Ecchymosis, Hemosiderin Staining, Mottled, Pallor, Erythema. Periwound temperature was noted as No Abnormality. The periwound has tenderness on palpation. Wound #2 status is Open. Original cause of wound was Gradually Appeared. The wound is located on the Left,Lateral Malleolus. The wound measures 1.5cm length x 1.2cm width x 0.4cm depth; 1.414cm^2 area and 0.565cm^3 volume. There is Fat Layer (Subcutaneous Tissue) Exposed exposed. There is no tunneling or undermining noted. There is a medium amount of serosanguineous drainage noted. The wound margin is flat and intact. There is medium (34-66%) pink granulation within the wound bed. There is a medium (34-66%) amount of necrotic tissue within the wound bed including Eschar and Adherent Slough. The periwound skin appearance exhibited: Maceration. The periwound skin appearance did not exhibit: Callus, Crepitus, Excoriation, Induration, Rash, Scarring, Dry/Scaly, Atrophie Blanche, Cyanosis, Ecchymosis, Hemosiderin Staining, Mottled, Pallor, Rubor, Erythema.  Periwound temperature was noted as No Abnormality. The periwound has tenderness  on palpation. 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 There was a Selective/Open Wound Skin/Epidermis Debridement with a total area of 0.64 sq cm performed by STONE III, HOYT E., PA-C. With the following instrument(s): Curette to remove Viable tissue/material. Material removed includes Skin: Epidermis after achieving pain control using Other (lidocaine 4%). A time out was conducted at 12:55, prior to the start of the procedure. A Minimum amount of bleeding was controlled with Pressure. The procedure was tolerated well with a pain level of 0 throughout and a pain level of 2 following the procedure. Post Debridement Measurements: 0.8cm length x 0.8cm width x 0.3cm depth; 0.151cm^3 volume. Post debridement Stage noted as Category/Stage IV. Character of Wound/Ulcer Post Debridement is stable. Severity of Tissue Post Debridement is: Limited to breakdown of skin. Post procedure Diagnosis Wound #1: Same as Pre-Procedure Wound #2 Pre-procedure diagnosis of Wound #2 is a Pressure Ulcer located on the Left,Lateral Malleolus .Severity of Tissue Pre Debridement is: Limited to breakdown of skin. There was a Excisional Skin/Subcutaneous Tissue Debridement with a total area of 1.8 sq cm performed by STONE III, HOYT E., PA-C. With the following instrument(s): Curette to remove Viable tissue/material.  Material removed includes Subcutaneous Tissue, Slough, and Skin: Epidermis after achieving pain control using Other (lidocaine 4%). A time out was conducted at 12:55, prior to the start of the procedure. A Minimum amount of bleeding was controlled with Pressure. The procedure was tolerated well with a pain level of 0 throughout and a pain level of 2 following the procedure. Post Debridement Measurements: 1.2cm length x 1.5cm width x 0.5cm depth; 0.707cm^3 volume. Post debridement Stage noted as Category/Stage IV. Character of Wound/Ulcer Post Debridement is stable. Severity of Tissue Post Debridement is: Fat layer exposed. Post procedure Diagnosis Wound #2: Same as Pre-Procedure Plan Wound Cleansing: Wound #1 Right,Lateral Malleolus: Clean wound with Normal Saline. Cleanse wound with mild soap and water May Shower, gently pat wound dry prior to applying new dressing. Wound #2 Left,Lateral Malleolus: ALEYA, DURNELL ([id number]) Clean wound with Normal Saline. Cleanse wound with mild soap and water May Shower, gently pat wound dry prior to applying new dressing. Anesthetic (add to Medication List): Wound #1 Right,Lateral Malleolus: Topical Lidocaine 4% cream applied to wound bed prior to debridement (In Clinic Only). Wound #2 Left,Lateral Malleolus: Topical Lidocaine 4% cream applied to wound bed prior to debridement (In Clinic Only). Primary Wound Dressing: Wound #1 Right,Lateral Malleolus: Silver Collagen Wound #2 Left,Lateral Malleolus: Silver Collagen Secondary Dressing: Wound #1 Right,Lateral Malleolus: Boardered Foam Dressing Drawtex Dressing Change Frequency: Wound #1 Right,Lateral Malleolus: Change Dressing Monday, Wednesday, Friday Follow-up Appointments: Wound #1 Right,Lateral Malleolus: Return Appointment in 1 week. Wound #2 Left,Lateral Malleolus: Return Appointment in 1 week. Edema Control: Wound #1 Right,Lateral Malleolus: Patient to wear own compression  stockings Patient to wear own Velcro compression garment. Elevate legs to the level of the heart and pump ankles as often as possible Wound #2 Left,Lateral Malleolus: Patient to wear own compression stockings Patient to wear own Velcro compression garment. Elevate legs to the level of  the heart and pump ankles as often as possible Off-Loading: Wound #1 Right,Lateral Malleolus: Turn and reposition every 2 hours Other: - do not put pressure on your ankles Wound #2 Left,Lateral Malleolus: Turn and reposition every 2 hours Other: - do not put pressure on your ankles Additional Orders / Instructions: Wound #1 Right,Lateral Malleolus: Increase protein intake. Wound #2 Left,Lateral Malleolus: Increase protein intake. Home Health: Wound #1 Right,Lateral Malleolus: [place] Visits - [place] Nurse may visit PRN to address patient s wound care needs. FACE TO FACE ENCOUNTER: MEDICARE and MEDICAID PATIENTS: I certify that this patient is under my care and that I had a face-to-face encounter that meets the physician face-to-face encounter requirements with this patient on this date. The encounter with the patient was in whole or in part for the following MEDICAL CONDITION: (primary reason for [place]) MEDICAL NECESSITY: I certify, that based on my findings, NURSING services are a medically necessary home health service. HOME BOUND STATUS: I certify that my clinical findings support that this patient is homebound (i.e., Due to illness or injury, pt requires aid of supportive devices such as crutches, cane, wheelchairs, walkers, the use of special transportation or the assistance of another person to leave their place of residence. There is a normal inability to leave the home and doing so requires considerable and taxing effort. Other absences are for medical reasons / religious services and are infrequent or of short duration when for other reasons). If current  dressing causes regression in wound condition, may D/C ordered dressing product/s and apply Normal Saline [place], [name] ([id number]) Moist Dressing daily until next [place] / Other MD appointment. [place] of regression in wound condition at [phone]. Please direct any NON-WOUND related issues/requests for orders to patient's Primary Care Physician Wound #2 Left,Lateral Malleolus: [place] Visits - [place] Nurse may visit PRN to address patient s wound care needs. FACE TO FACE ENCOUNTER: MEDICARE and MEDICAID PATIENTS: I certify that this patient is under my care and that I had a face-to-face encounter that meets the physician face-to-face encounter requirements with this patient on this date. The encounter with the patient was in whole or in part for the following MEDICAL CONDITION: (primary reason for [place]) MEDICAL NECESSITY: I certify, that based on my findings, NURSING services are a medically necessary home health service. HOME BOUND STATUS: I certify that my clinical findings support that this patient is homebound (i.e., Due to illness or injury, pt requires aid of supportive devices such as crutches, cane, wheelchairs, walkers, the use of special transportation or the assistance of another person to leave their place of residence. There is a normal inability to leave the home and doing so requires considerable and taxing effort. Other absences are for medical reasons / religious services and are infrequent or of short duration when for other reasons). If current dressing causes regression in wound condition, may D/C ordered dressing product/s and apply Normal Saline Moist Dressing daily until next [place] / Other MD appointment. [place] of regression in wound condition at [phone]. Please direct any NON-WOUND related issues/requests for orders to patient's Primary Care  Physician Negative Pressure Wound Therapy: Wound #2 Left,Lateral Malleolus: Other: - SNAP VAC Currently patient tolerated debridement today without complication of both sites and post debridement things appear to be doing much better. I'm in a recommend that we continue with the above mentioned measures for the next week.  The patient is in agreement with plan. Please see above for specific wound care orders. We will see patient for re-evaluation in 1 week(s) here in the clinic. If anything worsens or changes patient will contact our office for additional recommendations. Electronic Signature(s) Signed: 09/19/2018 5:08:41 PM By: Worthy Keeler PA-C Entered By: Worthy Keeler on 09/19/2018 14:06:02 [place], [name] ([id number]) -------------------------------------------------------------------------------- ROS/PFSH Details Patient Name: [name] Date of Service: 09/19/2018 12:45 PM Medical Record Number: [id number] Patient Account Number: [id number] Date of Birth/Sex: 09-10-1923 (84 y.o. F) Treating RN: Cornell Barman Primary Care Provider: Grayland Ormond Other Clinician: Referring Provider: Grayland Ormond Treating Provider/Extender: Melburn Hake, HOYT Weeks in Treatment: 70 Information Obtained From Patient Wound History Do you currently have one or more open woundso Yes How many open wounds do you currently haveo 1 Approximately how long have you had your woundso 2 yrs How have you been treating your wound(s) until nowo mupirocin, soaking in epsom salt Has your wound(s) ever healed and then re-openedo No Have you had any lab work done in the past montho No Have you tested positive for an antibiotic resistant organism (MRSA, VRE)o No Have you tested positive for osteomyelitis (bone infection)o No Have you had any tests for circulation on your legso Yes Who ordered the testo Dr. Lucky Cowboy [facility] test [facility] Constitutional Symptoms (General Health) Complaints and  Symptoms: Negative for: Fever; Chills Eyes Medical History: Positive for: Cataracts - surgery Respiratory Complaints and Symptoms: No Complaints or Symptoms Cardiovascular Complaints and Symptoms: No Complaints or Symptoms Medical History: Positive for: Congestive Heart Failure; Hypertension Musculoskeletal Medical History: Positive for: Osteoarthritis Neurologic Medical History: Positive for: Neuropathy Oncologic AUSTIN, PONGRATZ ([id number]) Medical History: Negative for: Received Chemotherapy; Received Radiation Psychiatric Complaints and Symptoms: No Complaints or Symptoms HBO Extended History Items Eyes: Cataracts Immunizations Pneumococcal Vaccine: Received Pneumococcal Vaccination: Yes Implantable Devices Family and Social History Cancer: Yes - Father,Siblings; Diabetes: No; Heart Disease: Yes - Siblings; Hereditary Spherocytosis: No; Hypertension: No; Kidney Disease: No; Lung Disease: No; Seizures: No; Stroke: No; Thyroid Problems: No; Tuberculosis: No; Never smoker; Marital Status - Married; Alcohol Use: Never; Drug Use: No History; Caffeine Use: Rarely; Financial Concerns: No; Food, Clothing or Shelter Needs: No; Support System Lacking: No; Transportation Concerns: No; Advanced Directives: No; Patient does not want information on Advanced Directives; Do not resuscitate: No; Living Will: Yes (Not Provided); Medical Power of Attorney: No Physician Affirmation I have reviewed and agree with the above information. Electronic Signature(s) Signed: 09/19/2018 5:03:58 PM By: Gretta Cool, BSN, RN, CWS, Kim RN, BSN Signed: 09/19/2018 5:08:41 PM By: Worthy Keeler PA-C Entered By: Worthy Keeler on 09/19/2018 14:04:05 [name], [name] ([id number]) -------------------------------------------------------------------------------- SuperBill Details Patient Name: [name] Date of Service: 09/19/2018 Medical Record Number: [id number] Patient Account Number:  [id number] Date of Birth/Sex: 1923/11/09 (84 y.o. 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, left ankle and foot Facility Procedures CPT4 Code: 10258527 Description: 78242 - DEB SUBQ TISSUE 20 SQ CM/< ICD-10 Diagnosis Description L89.524 Pressure ulcer of left ankle, stage 4  Modifier: Quantity: 1 CPT4 Code: 82883374 Description: 45146 - DEBRIDE WOUND 1ST 20 SQ CM OR < ICD-10 Diagnosis Description 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, stage 4 Modifier: Quantity: 1 CPT4 Code: 04799872 Description: 15872 NEG PRESS WND TX <=50 SQ CM ICD-10 Diagnosis Description L89.524 Pressure ulcer of left ankle, stage 4 Modifier: Quantity: 1 Physician Procedures CPT4 Code: 7618485 Description: 92763 - WC PHYS SUBQ TISS 20 SQ CM ICD-10 Diagnosis Description L89.524 Pressure ulcer of left ankle, stage 4 Modifier: Quantity: 1 CPT4 Code: 9432003 Description: 79444 - WC PHYS DEBR WO ANESTH 20 SQ CM ICD-10 Diagnosis Description 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, stage 4 Modifier: Quantity: 1 Electronic Signature(s) Signed: 09/19/2018 5:08:41 PM By: Sherene Sires, [name] ([id number]) Entered By: Worthy Keeler on 09/19/2018 14:06:55

## 2018-09-25 ENCOUNTER — Ambulatory Visit (INDEPENDENT_AMBULATORY_CARE_PROVIDER_SITE_OTHER): Payer: Medicare Other | Admitting: Internal Medicine

## 2018-09-25 ENCOUNTER — Encounter: Payer: Self-pay | Admitting: Internal Medicine

## 2018-09-25 VITALS — BP 121/75 | HR 92 | Temp 98.4°F | Ht 65.0 in | Wt 150.0 lb

## 2018-09-25 DIAGNOSIS — Z5181 Encounter for therapeutic drug level monitoring: Secondary | ICD-10-CM

## 2018-09-25 DIAGNOSIS — K521 Toxic gastroenteritis and colitis: Secondary | ICD-10-CM

## 2018-09-25 DIAGNOSIS — M86172 Other acute osteomyelitis, left ankle and foot: Secondary | ICD-10-CM | POA: Diagnosis not present

## 2018-09-25 NOTE — Progress Notes (Signed)
   Subjective:    Patient ID: [name], female    DOB: December 23, 1922, 84 y.o.   MRN: [id number]  HPI Here for follow up of ostemyelitis.  She was intially referred due to concern for osteomyelitis and ended up in the hospital with non-healing left lateral malleolus ulcer.  A recent bone culture done at wound care by Dr. Grayling Congress with [facility].  In the hospital she was noted to have cellulitis and swelling and put on IV cefazolin and improved some and sent out on Keflex.  She though has continued the Augmentin since she still had it.  She underwent a CT evaluation of the left malleolus which was concerning for osteomyleitis with lytic areas on the bone and exposure of the bone.  She is known to have poor vasculature overall.   I started her on Zyvox which she took for 2 weeks.  She has significant side effects including some loose stools, less than 5/day, significant fatigue and malaise she feelt was associated with her antibiotic.  She also recently had a yeast infection and was treated by her primary doctor.  In review of the note from wound care from earlier this month, the left malleolus does appear to be slowly improving, VAC still in place.  I then started her on Keflex which she has been taking for about 3 weeks now.  I have plan to keep her on that for 4 weeks.  Her labs at her last visit were reassuring with a sed rate that his normal at 25 which is down from a high of 80 and his CRP also within normal limits at 3.1.  She feels well and does not have the significant side effects she did on the Zyvox.  The wound continues to heal as noted in the wound care note.  She was noted to have some mild thrombocytopenia, likely from the Zyvox.  Review of Systems  Constitutional: Negative for fatigue.  Gastrointestinal: Negative for diarrhea and nausea.  Skin: Negative for rash.  Neurological: Negative for dizziness.       Objective:   Physical Exam  Constitutional: She appears  well-developed and well-nourished. No distress.  Eyes: No scleral icterus.  Cardiovascular: Normal rate, regular rhythm and normal heart sounds.  No murmur heard. Pulmonary/Chest: Effort normal and breath sounds normal. No respiratory distress.  Skin: No rash noted.   Past Medical History:  Diagnosis Date  . Hypothyroidism   . Peripheral vascular disease ([place])   . Pneumonia   . Presence of permanent cardiac pacemaker    2010  . Skin cancer, basal cell    skin cancer   . Stroke ([place])   . Varicose veins          Assessment & Plan:

## 2018-09-25 NOTE — Assessment & Plan Note (Addendum)
This seems to really be improving and labs as well as the clinical history are very reassuring.  I am going to recheck her inflammatory markers today and have her finish the full month of the Keflex which is about 1 more week and then stop and she will be observed off of antibiotics.  Since she sees wound care every week I will defer to them if there is any concern of worsening wound or concerns for new infection with bone exposure and if that is the case we can consider long-term IV antibiotics.  I though do not expect that to be the case based on her current findings.  She can return here as needed or if there is new concerns.  ADDENDUM: inflammatory markers noted with CRP elevated at 22 and ESR a bit up at 46.  Not overly concerning so will continue with the plan of observing off of antibiotics and follow up as needed.  She will continue to see wound care and as long as it continues to heal, no changes.

## 2018-09-25 NOTE — Assessment & Plan Note (Signed)
Fortunately, this has resolved.

## 2018-09-25 NOTE — Assessment & Plan Note (Signed)
Her platelets were down and I suspect from the Zyvox and I will recheck this today.  Hopefully it has rebounded well.

## 2018-09-26 LAB — CBC WITH DIFFERENTIAL/PLATELET
BASOS ABS: 30 {cells}/uL (ref 0–200)
BASOS PCT: 0.3 %
EOS ABS: 424 {cells}/uL (ref 15–500)
Eosinophils Relative: 4.2 %
HCT: 33.6 % — ABNORMAL LOW (ref 35.0–45.0)
Hemoglobin: 10.9 g/dL — ABNORMAL LOW (ref 11.7–15.5)
Lymphs Abs: 1141 cells/uL (ref 850–3900)
MCH: 27.4 pg (ref 27.0–33.0)
MCHC: 32.4 g/dL (ref 32.0–36.0)
MCV: 84.4 fL (ref 80.0–100.0)
MONOS PCT: 13.5 %
MPV: 10.4 fL (ref 7.5–12.5)
NEUTROS PCT: 70.7 %
Neutro Abs: 7141 cells/uL (ref 1500–7800)
PLATELETS: 410 10*3/uL — AB (ref 140–400)
RBC: 3.98 10*6/uL (ref 3.80–5.10)
RDW: 15.1 % — AB (ref 11.0–15.0)
TOTAL LYMPHOCYTE: 11.3 %
WBC mixed population: 1364 cells/uL — ABNORMAL HIGH (ref 200–950)
WBC: 10.1 10*3/uL (ref 3.8–10.8)

## 2018-09-26 LAB — COMPREHENSIVE METABOLIC PANEL
AG RATIO: 1 (calc) (ref 1.0–2.5)
ALT: 13 U/L (ref 6–29)
AST: 24 U/L (ref 10–35)
Albumin: 3.6 g/dL (ref 3.6–5.1)
Alkaline phosphatase (APISO): 86 U/L (ref 33–130)
BILIRUBIN TOTAL: 0.4 mg/dL (ref 0.2–1.2)
BUN/Creatinine Ratio: 20 (calc) (ref 6–22)
BUN: 21 mg/dL (ref 7–25)
CALCIUM: 9.3 mg/dL (ref 8.6–10.4)
CHLORIDE: 105 mmol/L (ref 98–110)
CO2: 24 mmol/L (ref 20–32)
Creat: 1.03 mg/dL — ABNORMAL HIGH (ref 0.60–0.88)
GLOBULIN: 3.7 g/dL (ref 1.9–3.7)
Glucose, Bld: 149 mg/dL — ABNORMAL HIGH (ref 65–99)
POTASSIUM: 4.7 mmol/L (ref 3.5–5.3)
SODIUM: 139 mmol/L (ref 135–146)
TOTAL PROTEIN: 7.3 g/dL (ref 6.1–8.1)

## 2018-09-26 LAB — SEDIMENTATION RATE: Sed Rate: 46 mm/h — ABNORMAL HIGH (ref 0–30)

## 2018-09-26 LAB — C-REACTIVE PROTEIN: CRP: 22.1 mg/L — ABNORMAL HIGH (ref <8.0)

## 2018-09-27 ENCOUNTER — Encounter: Payer: Medicare Other | Admitting: Physician Assistant

## 2018-09-27 DIAGNOSIS — I70243 Atherosclerosis of native arteries of left leg with ulceration of ankle: Secondary | ICD-10-CM | POA: Diagnosis not present

## 2018-09-29 NOTE — Progress Notes (Signed)
[name], [name] ([id number]) Visit Report for 09/27/2018 Chief Complaint Document Details Patient Name: [name], [name]. Date of Service: 09/27/2018 11:00 AM Medical Record Number: [id number] Patient Account Number: [id number] Date of Birth/Sex: 31-Dec-1922 (84 y.o. F) Treating RN: Montey Hora Primary Care Provider: Grayland Ormond Other Clinician: Referring Provider: Grayland Ormond Treating Provider/Extender: Melburn Hake, HOYT Weeks in Treatment: 24 Information Obtained from: Patient Chief Complaint Patient is here for right lateral malleolus and left lateral malleolus ulcer Electronic Signature(s) Signed: 09/27/2018 6:09:48 PM By: Worthy Keeler PA-C Entered By: Worthy Keeler on 09/27/2018 11:22:16 Giel, [name] ([id number]) -------------------------------------------------------------------------------- Debridement Details Patient Name: [name] Date of Service: 09/27/2018 11:00 AM Medical Record Number: [id number] Patient Account Number: [id number] Date of Birth/Sex: 1923-05-21 (84 y.o. F) Treating RN: Montey Hora Primary Care Provider: Grayland Ormond Other Clinician: Referring Provider: Grayland Ormond Treating Provider/Extender: Melburn Hake, HOYT Weeks in Treatment: 79 Debridement Performed for Wound #2 Left,Lateral Malleolus Assessment: Performed By: Physician STONE III, HOYT E., PA-C Debridement Type: Debridement Severity of Tissue Pre Fat layer exposed Debridement: Level of Consciousness (Pre- Awake and Alert procedure): Pre-procedure Verification/Time Yes - 11:31 Out Taken: Start Time: 11:31 Pain Control: Lidocaine 4% Topical Solution Total Area Debrided (L x W): 1.3 (cm) x 1 (cm) = 1.3 (cm) Tissue and other material Viable, Non-Viable, Slough, Subcutaneous, Slough debrided: Level: Skin/Subcutaneous Tissue Debridement Description: Excisional Instrument: Curette Bleeding: Minimum Hemostasis Achieved: Pressure End Time:  11:33 Procedural Pain: 0 Post Procedural Pain: 0 Response to Treatment: Procedure was tolerated well Level of Consciousness Awake and Alert (Post-procedure): Post Debridement Measurements of Total Wound Length: (cm) 1.3 Stage: Category/Stage IV Width: (cm) 1 Depth: (cm) 0.6 Volume: (cm) 0.613 Character of Wound/Ulcer Post Improved Debridement: Severity of Tissue Post Debridement: Fat layer exposed Post Procedure Diagnosis Same as Pre-procedure Electronic Signature(s) Signed: 09/27/2018 5:35:44 PM By: Montey Hora Signed: 09/27/2018 6:09:48 PM By: Worthy Keeler PA-C Entered By: Montey Hora on 09/27/2018 11:34:11 Dwan, [name] ([id number]) -------------------------------------------------------------------------------- HPI Details Patient Name: [name] Date of Service: 09/27/2018 11:00 AM Medical Record Number: [id number] Patient Account Number: [id number] Date of Birth/Sex: Oct 02, 1923 (84 y.o. 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 Dr. Amalia Hailey the podiatrist saw her on 07/20/2017 and sent her to the wound center for possible hyperbaric oxygen therapy. past medical history of peripheral vascular disease, varicose veins, status post appendectomy, basal cell carcinoma excision from the left leg, cholecystectomy, pacemaker placement, right lower extremity angiography done by Dr. dew in March 2017 with placement of a stent. there is also note of a successful ablation of the right small saphenous vein done which was reviewed by ultrasound on 10/24/2016. the patient had a  right small saphenous vein ablation done on 10/20/2016. The patient has never been a smoker. She has been seen by Dr. Corene Cornea dew the vascular surgeon who most recently saw her on 06/15/2017 for evaluation of ongoing problems with right leg swelling. She had a lower extremity arterial duplex examination done(02/13/17) which showed patent distal right superficial femoral artery stent and above-the-knee popliteal stent without evidence of restenosis. The ABI was more than 1.3 on the right and more than 1.3 on the left. This was  consistent with noncompressible arteries due to medial calcification. The right great toe pressure and PPG waveforms are within normal limits and the left great toe pressure and PPG waveforms are decreased. he recommended she continue to wear her compression stockings and continue with elevation. She is scheduled to have a noninvasive arterial study in the near future 08/16/2017 -- had a lower extremity arterial duplex examination done which showed patent distal right superficial femoral artery stent and above-the-knee popliteal stent without evidence of restenosis. The ABI was more than 1.3 on the right and more than 1.3 on the left. This was consistent with noncompressible arteries due to medial calcification. The right great toe pressure and PPG waveforms are within normal limits and the left great toe pressure and PPG waveforms are decreased. the x-ray of the right ankle has not yet been done 08/24/2017 -- had a right ankle x-ray -- IMPRESSION:1. No fracture, bone lesion or evidence of osteomyelitis. 2. Lateral soft tissue swelling with a soft tissue ulcer. she has not yet seen the vascular surgeon for review 08/31/17 on evaluation today patient's wound appears to be showing signs of improvement. She still with her appointment with vascular in order to review her results of her vascular study and then determine if any intervention would be recommended at that time. No  fevers, chills, nausea, or vomiting noted at this time. She has been tolerating the dressing changes without complication. 09/28/17 on evaluation today patient's wound appears to show signs of good improvement in regard to the granulation tissue which is surfacing. There is still a layer of slough covering the wound and the posterior portion is still significantly deeper than the anterior nonetheless there has been some good sign of things moving towards the better. She is going to go back to Dr. dew for reevaluation to ensure her blood flow is still appropriate. That will be before her next evaluation with [place] next week. No fevers, chills, nausea, or vomiting noted at this time. Patient does have some discomfort rated to be a 3-4/10 depending on activity specifically cleansing the wound makes it worse. 10/05/2017 -- the patient was seen by Dr. Lucky Cowboy last week and noninvasive studies showed a normal right ABI with brisk triphasic waveforms consistent with no arterial insufficiency including normal digital pressures. The duplex showed a patent distal right SFA stent and the proximal SFA was also normal. He was pleased with her test and thought she should have enough of perfusion for normal wound healing. He would see her back in 6 months time. 12/21/17 on evaluation today patient appears to be doing fairly well in regard to her right lateral ankle wound. Unfortunately the main issue that she is expansion at this point is that she is having some issues with what appears to be some cellulitis in the YOCHEVED, DEPNER. ([id number]) right anterior shin. She has also been noting a little bit of uncomfortable feeling especially last night and her ankle area. I'm afraid that she made the developing a little bit of an infection. With that being said I think it is in the early stages. 12/28/17 on evaluation today patient's ankle appears to be doing excellent. She's making good progress at this point  the cellulitis seems to have improved after last week's evaluation. Overall she is having no significant discomfort which is excellent news. She does have an appointment with Dr. dew on March 29, 2018 for reevaluation in regard to the stent he placed. She seems to have excellent blood flow in the  right lower extremity. 01/19/12 on evaluation today patient's wound appears to be doing very well. In fact she does not appear to require debridement at this point, there's no evidence of infection, and overall from the standpoint of the wound she seems to be doing very well. With that being said I believe that it may be time to switch to different dressing away from the [facility] Dressing she tells me she does have a lot going on her friend actually passed away yesterday and she's also having a lot of issues with her husband this obviously is weighing heavy on her as far as your thoughts and concerns today. 01/25/18 on evaluation today patient appears to be doing fairly well in regard to her right lateral malleolus. She has been tolerating the dressing changes without complication. Overall I feel like this is definitely showing signs of improvement as far as how the overall appearance of the wound is there's also evidence of epithelium start to migrate over the granulation tissue. In general I think that she is progressing nicely as far as the wound is concerned. The only concern she really has is whether or not we can switch to every other week visits in order to avoid having as many appointments as her daughters have a difficult time getting her to her appointments as well as the patient's husband to his he is not doing very well at this point. 02/22/18 on evaluation today patient's right lateral malleolus ulcer appears to be doing great. She has been tolerating the dressing changes without complication. Overall you making excellent progress at this time. Patient is having no  significant discomfort. 03/15/18 on evaluation today patient appears to be doing much more poorly in regard to her right lateral ankle ulcer at this point. Unfortunately since have last seen her her husband has passed just a few days ago is obviously weighed heavily on her her daughter also had surgery well she is with her today as usual. There does not appear to be any evidence of infection she does seem to have significant contusion/deep tissue injury to the right lateral malleolus which was not noted previous when I saw her last. It's hard to tell of exactly when this injury occurred although during the time she was spending the night in the hospital this may have been most likely. 03/22/18 on evaluation today patient appears to actually be doing very well in regard to her ulcer. She did unfortunately have a setback which was noted last week however the good news is we seem to be getting back on track and in fact the wound in the core did still have some necrotic tissue which will be addressed at this point today but in general I'm seeing signs that things are on the up and up. She is glad to hear this obviously she's been somewhat concerned that due to the how her wound digressed more recently. 03/29/18 on evaluation today patient appears to be doing fairly well in regard to her right lower extremity lateral malleolus ulcer. She unfortunately does have a new area of pressure injury over the inferior portion where the wound has opened up a little bit larger secondary to the pressure she seems to be getting. She does tell me sometimes when she sleeps at night that it actually hurts and does seem to be pushing on the area little bit more unfortunately. There does not appear to be any evidence of infection which is good news. She has been tolerating the dressing changes  without complication. She also did have some bruising in the left second and third toes due to the fact that she may have bump this  or injured it although she has neuropathy so she does not feel she did move recently that may have been where this came from. Nonetheless there does not appear to be any evidence of infection at this time. 04/12/18 on evaluation today patient's wound on the right lateral ankle actually appears to be doing a little bit better with a lot of necrotic docking tissue centrally loosening up in clearing away. However she does have the beginnings of a deep tissue injury on the left lateral malleolus likely due to the fact we've been trying offload the right as much as we have. I think she may benefit from an assistive soft device to help with offloading and it looks like they're looking at one of the doughnut conditions that wraps around the lower leg to offload which I think will definitely do a good job. With that being said I think we definitely need to address this issue on the left before it becomes a wound. Patient is not having significant pain. 04/19/18 on evaluation today patient appears to be doing excellent in regard to the progress she's made with her right lateral ankle ulcer. The left ankle region which did show evidence of a deep tissue injury seems to be resolving there's little fluid noted underneath and a blister there's nothing open at this point in time overall I feel like this is progressing nicely which is good news. She does not seem to be having significant discomfort at this point which is also good news. 04/25/18-She is here in follow up evaluation for bilateral lateral malleolar ulcers. The right lateral malleolus ulcer with pale subcutaneous tissue exposure, central area of ulcer with tendon/periosteum exposed. The left lateral malleolus ulcer now with Godden, [name] ([id number]) central area of nonviable tissue, otherwise deep tissue injury. She is wearing compression wraps to the left lower extremity, she will place the right lower extremity compression wraps on when she gets  home. She will be out of town over the weekend and return next week and follow-up appointment. She completed her doxycycline this morning 05/03/18 on evaluation today patient appears to be doing very well in regard to her right lateral ankle ulcer in general. At least she's showing some signs of improvement in this regard. Unfortunately she has some additional injury to the left lateral malleolus region which appears to be new likely even over the past several days. Again this determination is based on the overall appearance. With that being said the patient is obviously frustrated about this currently. 05/10/18-She is here in follow-up evaluation for bilateral lateral malleolar ulcers. She states she has purchased offloading shoes/boots and they will arrive tomorrow. She was asked to bring them in the office at next week's appointment so her provider is aware of product being utilized. She continues to sleep on right or left side, she has been encouraged to sleep on her back. The right lateral malleolus ulcer is precariously close to peri-osteum; will order xray. The left lateral malleolus ulcer is improved. Will switch back to santyl; she will follow up next week. 05/17/18 on evaluation today patient actually appears to be doing very well in regard to her malleolus her ulcers compared to last time I saw them. She does not seem to have as much in the way of contusion at this point which is great news. With that being  said she does continue to have discomfort and I do believe that she is still continuing to benefit from the offloading/pressure reducing boots that were recommended. I think this is the key to trying to get this to heal up completely. 05/24/18 on evaluation today patient actually appears to be doing worse at this point in time unfortunately compared to her last week's evaluation. She is having really no increased pain which is good news unfortunately she does have more maceration in your  theme and noted surrounding the right lateral ankle the left lateral ankle is not really is erythematous I do not see signs of the overt cellulitis on that side. Unfortunately the wounds do not seem to have shown any signs of improvement since the last evaluation. She also has significant swelling especially on the right compared to previous some of this may be due to infection however also think that she may be served better while she has these wounds by compression wrapping versus continuing to use the Juxta-Lite for the time being. Especially with the amount of drainage that she is experiencing at this point. No fevers, chills, nausea, or vomiting noted at this time. 05/31/18 on evaluation today patient appears to actually be doing better in regard to her right lateral lower extremity ulcer specifically on the malleolus region. She has been tolerating the antibiotic without complication. With that being said she still continues to have issues but a little bit of redness although nothing like she what she was experiencing previous. She still continues to pressure to her ankle area she did get the problem on offloading boots unfortunately she will not wear them she states there too uncomfortable and she can't get in and out of the bed. Nonetheless at this point her wounds seem to be continually getting worse which is not what we want I'm getting somewhat concerned about her progress and how things are going to proceed if we do not intervene in some way shape or form. I therefore had a very lengthy conversation today about offloading yet again and even made a specific suggestion for switching her to a memory foam mattress and even gave the information for a specific one that they could look at getting if it was something that they were interested in considering. She does not want to be considered for a hospital bed air mattress although honestly insurance would not cover it that she does not have any  wounds on her trunk. 06/14/18 on evaluation today both wounds over the bilateral lateral malleolus her ulcers appear to be doing better there's no evidence of pressure injury at this point. She did get the foam mattress for her bed and this does seem to have been extremely beneficial for her in my pinion. Her daughter states that she is having difficulty getting out of bed because of how soft it is. The patient also relates this to be. Nonetheless I do feel like she's actually doing better. Unfortunately right after and around the time she was getting the mattress she also sustained a fall when she got up to go pick up the phone and ended up injuring her right elbow she has 18 sutures in place. We are not caring for this currently although home health is going to be taking the sutures out shortly. Nonetheless this may be something that we need to evaluate going forward. It depends on how well it has or has not healed in the end. She also recently saw an orthopedic specialist for an injection  in the right shoulder just before her fall unfortunately the fall seems to have worsened her pain. 06/21/18 on evaluation today patient appears to be doing about the same in regard to her lateral malleolus ulcers. Both appear to be just a little bit deeper but again we are clinging away the necrotic and dead tissue which I think is why this is progressing towards a deeper realm as opposed to improving from my measurement standpoint in that regard. Nonetheless she has been tolerating the dressing changes she absolutely hates the memory foam mattress topper that was obtained for her nonetheless I do believe this is still doing excellent as far as taking care of excess pressure in regard to the lateral malleolus regions. She in fact has no pressure injury that I see whereas in weeks past it was week by week I was constantly seeing new pressure injuries. Overall I think it has been very beneficial for her. 07/03/18;  patient arrives in my clinic today. She has deep punched out areas over her bilateral lateral malleoli. The area on the right has some more depth. NAKESHIA, WALDECK ([id number]) We spent a lot of time today talking about pressure relief for these areas. This started when her daughter asked for a prescription for a memory foam mattress. I have never written a prescription for a mattress and I don't think insurances would pay for that on an ordinary bed. In any case he came up that she has foam boots that she refuses to wear. I would suggest going to these before any other offloading issues when she is in bed. They say she is meticulous about offloading this the rest of the day 07/10/18- She is seen in follow-up evaluation for bilateral, lateral malleolus ulcers. There is no improvement in the ulcers. She has purchased and is sleeping on a memory foam mattress/overlay, she has been using the offloading boots nightly over the past week. She has a follow up appointment with vascular medicine at the end of October, in my opinion this follow up should be expedited given her deterioration and suboptimal TBI results. We will order plain film xray of the left ankle as deeper structures are palpable; would consider having MRI, regardless of xray report(s). The ulcers will be treated with iodoflex/iodosorb, she is unable to safely change the dressings daily with santyl. 07/19/18 on evaluation today patient appears to be doing in general visually well in regard to her bilateral lateral malleolus ulcers. She has been tolerating the dressing changes without complication which is good news. With that being said we did have an x-ray performed on 07/12/18 which revealed a slight loosen see in the lateral portion of the distal left fibula which may represent artifact but underline lytic destruction or osteomyelitis could not be excluded. MRI was recommended. With that being said we can see about getting the patient  scheduled for an MRI to further evaluate this area. In fact we have that scheduled currently for August 20 19,019. 07/26/18 on evaluation today patient's wound on the right lateral ankle actually appears to be doing fairly well at this point in my pinion. She has made some good progress currently. With that being said unfortunately in regard to the left lateral ankle ulcer this seems to be a little bit more problematic at this time. In fact as I further evaluated the situation she actually had bone exposed which is the first time that's been the case in the bone appear to be necrotic. Currently I did review patient's  note from Dr. Bunnie Domino office with [facility]. He stated that ABI was 1.26 on the right and 0.95 on the left with good waveforms. Her perfusion is stable not reduced from previous studies and her digital waveforms were pretty good particularly on the right. His conclusion upon review of the note was that there was not much she could do to improve her perfusion and he felt she was adequate for wound healing. His suggestion was that she continued to see [place] and consider a synthetic skin graft if there was no underlying infection. He plans to see her back in six months or as needed. 08/01/18 on evaluation today patient appears to be doing better in regard to her right lateral ankle ulcer. Her left lateral ankle ulcer is about the same she still has bone involvement in evidence of necrosis. There does not appear to be evidence of infection at this time On the right lateral lower extremity. I have started her on the Augmentin she picked this up and started this yesterday. This is to get her through until she sees infectious disease which is scheduled for 08/12/18. 08/06/18 on evaluation today patient appears to be doing rather well considering my discussion with patient's daughter at the end of last week. The area which was marked where she had erythema seems to be improved and  this is good news. With that being said overall the patient seems to be making good improvement when it comes to the overall appearance of the right lateral ankle ulcer although this has been slow she at least is coming around in this regard. Unfortunately in regard to the left lateral ankle ulcer this is osteomyelitis based on the pathology report as well is bone culture. Nonetheless we are still waiting CT scan. Unfortunately the MRI we originally ordered cannot be performed as the patient is a pacemaker which I had overlooked. Nonetheless we are working on the CT scan approval and scheduling as of now. She did go to the hospital over the weekend and was placed on IV Cefzo for a couple of days. Fortunately this seems to have improved the erythema quite significantly which is good news. There does not appear to be any evidence of worsening infection at this time. She did have some bleeding after the last debridement therefore I did not perform any sharp debridement in regard to left lateral ankle at this point. Patient has been approved for a snap vac for the right lateral ankle. 08/14/18; the patient with wounds over her bilateral lateral malleoli. The area on the right actually looks quite good. Been using a snap back on this area. Healthy granulation and appears to be filling in. Unfortunately the area on the left is really problematic. 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 Also noted to have cellulitis. She saw Dr. Novella Olive of infectious disease today and was put on linezolid. We are able to verify this with her pharmacy. She is completed the Augmentin that she was already on. We've been using Iodoflex to this area 08/23/18 on evaluation today patient's wounds both actually appear to be doing better compared to my prior evaluations. Fortunately she showing signs of good improvement in regard to the overall wound  status especially where were using the snap vac on the right. In regard to left lateral malleolus the wound bed actually appears to be much cleaner than previously noted. I do not feel  any phone directly probed during evaluation today and though there is tendon noted this does not appear Polito, [name] ([id number]) to be necrotic it's actually fairly good as far as the overall appearance of the tendon is concerned. In general the wound bed actually appears to be doing significantly better than it was previous. Patient is currently in the care of [name] and I did review that note today. He actually has her on two weeks of linezolid and then following the patient will be on 1-2 months of Keflex. That is the plan currently. She has been on antibiotics therapy as prescribed by myself initially starting on July 30, 2018 and has been on that continuously up to this point. 08/30/18 on evaluation today patient actually appears to be doing much better in regard to her right lateral malleolus ulcer. She has been tolerating the dressing changes specifically the snap vac without complication although she did have some issues with the seal currently. Apparently there was some trouble with getting it to maintain over the past week past Sunday. Nonetheless overall the wound appears better in regard to the right lateral malleolus region. In regard to left lateral malleolus this actually show some signs of additional granulation although there still tendon noted in the base of the wound this appears to be healthy not necrotic in any way whatsoever. We are considering potentially using a snap vac for the left lateral malleolus as well the product wrap from [employer], [place], was present in the clinic today we're going to see this patient I did have her come in with me after obtaining consent from the patient and her daughter in order to look at the wound and see if there's any recommendation one way or another as  to whether or not they felt the snapback could be beneficial for the left lateral malleolus region. But the conclusion was that it might be but that this is definitely a little bit deeper wound than what traditionally would be utilized for a snap vac. 09/06/18 on evaluation today patient actually appears to be doing excellent in my pinion in regard to both ankle ulcers. She has been tolerating the dressing changes without complication which is great news. Specifically we have been using the snap vac. In regard to the right ankle I'm not even sure that this is going to be necessary for today and following as the wound has filled in quite nicely. In regard to the left ankle I do believe that we're seeing excellent epithelialization from the edge as well as granulation in the central portion the tendon is still exposed but there's no evidence of necrotic bone and in general I feel like the patient has made excellent progress even compared to last week with just one week of the snap vac. 09/11/18; this is a patient who has wounds on her bilateral lateral malleoli. Initially both of these were deep stage IV wounds in the setting of chronic arterial insufficiency. She has been revascularized. As I understand think she been using snap vacs to both of these wounds however the area on the right became more superficial and currently she is only using it on the left. Using silver collagen on the right and silver collagen under the back on the left I believe 09/19/18 on evaluation today patient actually appears to be doing very well in regard to her lateral malleolus or ulcers bilaterally. She has been tolerating the dressing changes without complication. Fortunately there does not appear to be any evidence  of infection at this time. Overall I feel like she is improving in an excellent manner and I'm very pleased with the fact that everything seems to be turning towards the better for her. This has obviously been  a long road. 09/27/18 on evaluation today patient actually appears to be doing very well in regard to her bilateral lateral malleolus ulcers. She has been tolerating the dressing changes without complication. Fortunately there does not appear to be any evidence of infection at this time which is also great news. No fevers, chills, nausea, or vomiting noted at this time. Overall I feel like she is doing excellent with the snap vac on the left malleolus. She had 40 mL of fluid collection over the past week. Electronic Signature(s) Signed: 09/27/2018 6:09:48 PM By: Worthy Keeler PA-C Entered By: Worthy Keeler on 09/27/2018 13:30:11 Morro, [name] ([id number]) -------------------------------------------------------------------------------- Physical Exam Details Patient Name: [name] Date of Service: 09/27/2018 11:00 AM Medical Record Number: [id number] Patient Account Number: [id number] Date of Birth/Sex: 11-02-1923 (84 y.o. F) Treating RN: Montey Hora Primary Care Provider: Grayland Ormond Other Clinician: Referring Provider: Grayland Ormond Treating Provider/Extender: STONE III, HOYT Weeks in Treatment: 62 Constitutional Well-nourished and well-hydrated in no acute distress. Respiratory normal breathing without difficulty. clear to auscultation bilaterally. Cardiovascular regular rate and rhythm with normal S1, S2. Psychiatric this patient is able to make decisions and demonstrates good insight into disease process. Alert and Oriented x 3. pleasant and cooperative. Notes Patient's wound beds currently are doing very well. There was some [place] noted on the left lateral malleolus. And go to the right lateral malleolus this actually appear to be doing excellent. No sharp debridement was required on the right the left did require sharp debridement to clean away the necrotic material. Post debridement the wound bed appears to be doing significantly better. The tendon  is no longer directly visible at this point which is also excellent news. Electronic Signature(s) Signed: 09/27/2018 6:09:48 PM By: Worthy Keeler PA-C Entered By: Worthy Keeler on 09/27/2018 13:30:55 Salvi, [name] ([id number]) -------------------------------------------------------------------------------- Physician Orders Details Patient Name: [name] Date of Service: 09/27/2018 11:00 AM Medical Record Number: [id number] Patient Account Number: [id number] Date of Birth/Sex: July 21, 1923 (84 y.o. 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 o Cleanse wound with mild soap and water o May Shower, gently pat wound dry prior to applying new dressing. Wound #2 Left,Lateral Malleolus o Clean wound with Normal Saline. o Cleanse wound with mild soap and water o May Shower, gently pat wound dry prior to applying new dressing. Anesthetic (add to Medication List) Wound #1 Right,Lateral Malleolus o Topical Lidocaine 4% cream applied to wound bed prior to debridement (In Clinic Only). Wound #2 Left,Lateral Malleolus o Topical Lidocaine 4% cream applied to wound bed prior to debridement (In Clinic Only). Primary Wound Dressing Wound #1 Right,Lateral Malleolus o Silver Collagen Wound  #2 Left,Lateral Malleolus o Silver Collagen  Secondary Dressing Wound #1 Right,Lateral Malleolus o Boardered Foam Dressing o Drawtex Dressing Change Frequency RELDA, AGOSTO ([id number]) Wound #1 Right,Lateral Malleolus o Change Dressing Monday, Wednesday, Friday Follow-up Appointments Wound #1 Right,Lateral Malleolus o Return Appointment in 1 week. Wound #2 Left,Lateral Malleolus o Return Appointment in 1 week. Edema Control Wound #1 Right,Lateral Malleolus o Patient to wear own compression stockings o Patient to wear own Velcro compression garment. o Elevate legs to the level of the heart and pump ankles as often as possible Wound #2 Left,Lateral Malleolus o Patient to wear own compression stockings o Patient to wear own Velcro compression garment. o Elevate legs to the level of the heart and pump ankles as often as possible Off-Loading Wound #1 Right,Lateral Malleolus o Turn and reposition every 2 hours o Other: - do not put pressure on your ankles Wound #2 Left,Lateral Malleolus o Turn and reposition every 2 hours o Other: - do not put pressure on your ankles Additional Orders / Instructions Wound #1 Right,Lateral Malleolus o Increase protein intake. Wound #2 Left,Lateral Malleolus o Increase protein intake. Home Health Wound #1 [place] Visits - [place] Nurse may visit PRN to address patientos wound care needs. o FACE TO FACE ENCOUNTER: MEDICARE and MEDICAID PATIENTS: I certify that this patient is under my care and that I had a face-to-face encounter that meets the physician face-to-face encounter requirements with this patient on this date. The encounter with the patient was in whole or in part for the following MEDICAL CONDITION: (primary reason for [place]) MEDICAL NECESSITY: I certify, that based on my findings, NURSING services are a medically necessary home  health service. HOME BOUND STATUS: I certify that my clinical findings support that this patient is homebound (i.e., Due to illness or injury, pt requires aid of supportive devices such as crutches, cane, wheelchairs, walkers, the use of special transportation or the assistance of another person to leave their place of residence. There is a normal inability to leave the home and doing so requires considerable and taxing effort. Other absences are for medical reasons / religious services and are infrequent or of short duration when for other reasons). o If current dressing causes regression in wound condition, may D/C ordered dressing product/s and apply Normal Saline Moist Dressing daily until next [place] / Other MD appointment. [facility] of regression in wound condition at [phone]. o Please direct any NON-WOUND related issues/requests for orders to patient's Primary Care Physician DEMA, TIMMONS ([id number]) Wound #2 [place] Visits - [name] Nurse may visit PRN to address patientos wound care needs. o FACE TO FACE ENCOUNTER: MEDICARE and MEDICAID PATIENTS: I certify that this patient is under my care and that I had a face-to-face encounter that meets the physician face-to-face encounter requirements with this patient on this date. The encounter with the patient was in whole or in part for the following MEDICAL CONDITION: (primary reason for [place]) MEDICAL NECESSITY: I certify, that based on my findings, NURSING services are a medically necessary home health service. HOME BOUND STATUS: I certify that my clinical findings support that this patient is homebound (i.e., Due to illness or injury, pt requires aid of supportive devices such as crutches, cane, wheelchairs, walkers, the use of special transportation or the assistance of another person to leave their place of residence. There  is a normal inability to leave the home and doing so  requires considerable and taxing effort. Other absences are for medical reasons / religious services and are infrequent or of short duration when for other reasons). o If current dressing causes regression in wound condition, may D/C ordered dressing product/s and apply Normal Saline Moist Dressing daily until next [place] / Other MD appointment. [place] of regression in wound condition at [phone]. o Please direct any NON-WOUND related issues/requests for orders to patient's Primary Care Physician Negative Pressure Wound Therapy Wound #2 Left,Lateral Malleolus o Other: - SNAP VAC Electronic Signature(s) Signed: 09/27/2018 5:35:44 PM By: Montey Hora Signed: 09/27/2018 6:09:48 PM By: Worthy Keeler PA-C Entered By: Montey Hora on 09/27/2018 11:35:02 Yaffe, [name] ([id number]) -------------------------------------------------------------------------------- Problem List Details Patient Name: [name] Date of Service: 09/27/2018 11:00 AM Medical Record Number: [id number] Patient Account Number: [id number] Date of Birth/Sex: 06/29/1923 (84 y.o. F) Treating RN: Montey Hora Primary Care Provider: Grayland Ormond Other Clinician: Referring Provider: Grayland Ormond Treating Provider/Extender: Melburn Hake, HOYT Weeks in Treatment: 21 Active Problems ICD-10 Evaluated Encounter Code Description Active Date Today Diagnosis 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, stage 4 05/10/2018 No Yes L89.524 Pressure ulcer of left ankle, stage 4 04/25/2018 No Yes I70.233 Atherosclerosis of native arteries of right leg with ulceration of 08/10/2017 No Yes ankle I70.243 Atherosclerosis of native arteries of left leg with ulceration of 07/10/2018 No Yes ankle I89.0 Lymphedema, not elsewhere classified 08/10/2017 No Yes B35.4 Tinea corporis 09/28/2017 No Yes 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,  left ankle and foot 08/14/2018 No Yes Inactive Problems Resolved Problems Electronic Signature(s) Signed: 09/27/2018 6:09:48 PM By: Worthy Keeler PA-C Entered By: Worthy Keeler on 09/27/2018 11:22:11 Mar[place], [name] ([id number]) -------------------------------------------------------------------------------- Progress Note Details Patient Name: [name] Date of Service: 09/27/2018 11:00 AM Medical Record Number: [id number] Patient Account Number: [id number] Date of Birth/Sex: Feb 01, 1923 (84 y.o. F) Treating RN: Montey Hora Primary Care Provider: Grayland Ormond Other Clinician: Referring Provider: Grayland Ormond Treating Provider/Extender: Melburn Hake, HOYT Weeks in Treatment: 82 Subjective Chief Complaint Information obtained from Patient Patient is here for right lateral malleolus and left lateral malleolus ulcer History of Present Illness (HPI) 83 year old patient who most recently has been seeing both podiatry and vascular surgery for a long-standing ulcer of her right lateral malleolus which has been treated with various methodologies. Dr. Amalia Hailey the podiatrist saw her on 07/20/2017 and sent her to the wound center for possible hyperbaric oxygen therapy. past medical history of peripheral vascular disease, varicose veins, status post appendectomy, basal cell carcinoma excision from the left leg, cholecystectomy, pacemaker placement, right lower extremity angiography done by Dr. dew in March 2017 with placement of a stent. there is also note of a successful ablation of the right small saphenous vein done which was reviewed by ultrasound on 10/24/2016. the patient had a right small saphenous vein ablation done on 10/20/2016. The patient has never been a smoker. She has been seen by Dr. Corene Cornea dew the vascular surgeon who most recently saw her on 06/15/2017 for evaluation of ongoing problems with right leg swelling. She had a lower extremity arterial duplex examination  done(02/13/17) which showed patent distal right superficial femoral artery stent and above-the-knee popliteal stent without evidence of restenosis. The ABI was more than 1.3 on the right and more than 1.3 on the left. This was consistent with noncompressible arteries due to medial calcification. The right great toe pressure and PPG waveforms are within normal limits and the left great toe pressure and PPG waveforms  are decreased. he recommended she continue to wear her compression stockings and continue with elevation. She is scheduled to have a noninvasive arterial study in the near future 08/16/2017 -- had a lower extremity arterial duplex examination done which showed patent distal right superficial femoral artery stent and above-the-knee popliteal stent without evidence of restenosis. The ABI was more than 1.3 on the right and more than 1.3 on the left. This was consistent with noncompressible arteries due to medial calcification. The right great toe pressure and PPG waveforms are within normal limits and the left great toe pressure and PPG waveforms are decreased. the x-ray of the right ankle has not yet been done 08/24/2017 -- had a right ankle x-ray -- IMPRESSION:1. No fracture, bone lesion or evidence of osteomyelitis. 2. Lateral soft tissue swelling with a soft tissue ulcer. she has not yet seen the vascular surgeon for review 08/31/17 on evaluation today patient's wound appears to be showing signs of improvement. She still with her appointment with vascular in order to review her results of her vascular study and then determine if any intervention would be recommended at that time. No fevers, chills, nausea, or vomiting noted at this time. She has been tolerating the dressing changes without complication. 09/28/17 on evaluation today patient's wound appears to show signs of good improvement in regard to the granulation tissue which is surfacing. There is still a layer of slough covering  the wound and the posterior portion is still significantly deeper than the anterior nonetheless there has been some good sign of things moving towards the better. She is going to go back to Dr. dew for reevaluation to ensure her blood flow is still appropriate. That will be before her next evaluation with [place] next week. No fevers, chills, nausea, or vomiting noted at this time. Patient does have some discomfort rated to be a 3-4/10 depending on activity specifically cleansing the wound makes it worse. 10/05/2017 -- the patient was seen by Dr. Lucky Cowboy last week and noninvasive studies showed a normal right ABI with brisk RILLA, BUCKMAN. ([id number]) triphasic waveforms consistent with no arterial insufficiency including normal digital pressures. The duplex showed a patent distal right SFA stent and the proximal SFA was also normal. He was pleased with her test and thought she should have enough of perfusion for normal wound healing. He would see her back in 6 months time. 12/21/17 on evaluation today patient appears to be doing fairly well in regard to her right lateral ankle wound. Unfortunately the main issue that she is expansion at this point is that she is having some issues with what appears to be some cellulitis in the right anterior shin. She has also been noting a little bit of uncomfortable feeling especially last night and her ankle area. I'm afraid that she made the developing a little bit of an infection. With that being said I think it is in the early stages. 12/28/17 on evaluation today patient's ankle appears to be doing excellent. She's making good progress at this point the cellulitis seems to have improved after last week's evaluation. Overall she is having no significant discomfort which is excellent news. She does have an appointment with Dr. dew on March 29, 2018 for reevaluation in regard to the stent he placed. She seems to have excellent blood flow in the right lower  extremity. 01/19/12 on evaluation today patient's wound appears to be doing very well. In fact she does not appear to require debridement at this point, there's no  evidence of infection, and overall from the standpoint of the wound she seems to be doing very well. With that being said I believe that it may be time to switch to different dressing away from the [facility] Dressing she tells me she does have a lot going on her friend actually passed away yesterday and she's also having a lot of issues with her husband this obviously is weighing heavy on her as far as your thoughts and concerns today. 01/25/18 on evaluation today patient appears to be doing fairly well in regard to her right lateral malleolus. She has been tolerating the dressing changes without complication. Overall I feel like this is definitely showing signs of improvement as far as how the overall appearance of the wound is there's also evidence of epithelium start to migrate over the granulation tissue. In general I think that she is progressing nicely as far as the wound is concerned. The only concern she really has is whether or not we can switch to every other week visits in order to avoid having as many appointments as her daughters have a difficult time getting her to her appointments as well as the patient's husband to his he is not doing very well at this point. 02/22/18 on evaluation today patient's right lateral malleolus ulcer appears to be doing great. She has been tolerating the dressing changes without complication. Overall you making excellent progress at this time. Patient is having no significant discomfort. 03/15/18 on evaluation today patient appears to be doing much more poorly in regard to her right lateral ankle ulcer at this point. Unfortunately since have last seen her her husband has passed just a few days ago is obviously weighed heavily on her her daughter also had surgery well she is with her today as  usual. There does not appear to be any evidence of infection she does seem to have significant contusion/deep tissue injury to the right lateral malleolus which was not noted previous when I saw her last. It's hard to tell of exactly when this injury occurred although during the time she was spending the night in the hospital this may have been most likely. 03/22/18 on evaluation today patient appears to actually be doing very well in regard to her ulcer. She did unfortunately have a setback which was noted last week however the good news is we seem to be getting back on track and in fact the wound in the core did still have some necrotic tissue which will be addressed at this point today but in general I'm seeing signs that things are on the up and up. She is glad to hear this obviously she's been somewhat concerned that due to the how her wound digressed more recently. 03/29/18 on evaluation today patient appears to be doing fairly well in regard to her right lower extremity lateral malleolus ulcer. She unfortunately does have a new area of pressure injury over the inferior portion where the wound has opened up a little bit larger secondary to the pressure she seems to be getting. She does tell me sometimes when she sleeps at night that it actually hurts and does seem to be pushing on the area little bit more unfortunately. There does not appear to be any evidence of infection which is good news. She has been tolerating the dressing changes without complication. She also did have some bruising in the left second and third toes due to the fact that she may have bump this or injured it although  she has neuropathy so she does not feel she did move recently that may have been where this came from. Nonetheless there does not appear to be any evidence of infection at this time. 04/12/18 on evaluation today patient's wound on the right lateral ankle actually appears to be doing a little bit better with a  lot of necrotic docking tissue centrally loosening up in clearing away. However she does have the beginnings of a deep tissue injury on the left lateral malleolus likely due to the fact we've been trying offload the right as much as we have. I think she may benefit from an assistive soft device to help with offloading and it looks like they're looking at one of the doughnut conditions that wraps around the lower leg to offload which I think will definitely do a good job. With that being said I think we definitely need to address this issue on the left before it becomes a wound. Patient is not having significant pain. SHIRI, HODAPP ([id number]) 04/19/18 on evaluation today patient appears to be doing excellent in regard to the progress she's made with her right lateral ankle ulcer. The left ankle region which did show evidence of a deep tissue injury seems to be resolving there's little fluid noted underneath and a blister there's nothing open at this point in time overall I feel like this is progressing nicely which is good news. She does not seem to be having significant discomfort at this point which is also good news. 04/25/18-She is here in follow up evaluation for bilateral lateral malleolar ulcers. The right lateral malleolus ulcer with pale subcutaneous tissue exposure, central area of ulcer with tendon/periosteum exposed. The left lateral malleolus ulcer now with central area of nonviable tissue, otherwise deep tissue injury. She is wearing compression wraps to the left lower extremity, she will place the right lower extremity compression wraps on when she gets home. She will be out of town over the weekend and return next week and follow-up appointment. She completed her doxycycline this morning 05/03/18 on evaluation today patient appears to be doing very well in regard to her right lateral ankle ulcer in general. At least she's showing some signs of improvement in this regard.  Unfortunately she has some additional injury to the left lateral malleolus region which appears to be new likely even over the past several days. Again this determination is based on the overall appearance. With that being said the patient is obviously frustrated about this currently. 05/10/18-She is here in follow-up evaluation for bilateral lateral malleolar ulcers. She states she has purchased offloading shoes/boots and they will arrive tomorrow. She was asked to bring them in the office at next week's appointment so her provider is aware of product being utilized. She continues to sleep on right or left side, she has been encouraged to sleep on her back. The right lateral malleolus ulcer is precariously close to peri-osteum; will order xray. The left lateral malleolus ulcer is improved. Will switch back to santyl; she will follow up next week. 05/17/18 on evaluation today patient actually appears to be doing very well in regard to her malleolus her ulcers compared to last time I saw them. She does not seem to have as much in the way of contusion at this point which is great news. With that being said she does continue to have discomfort and I do believe that she is still continuing to benefit from the offloading/pressure reducing boots that were recommended. I think this  is the key to trying to get this to heal up completely. 05/24/18 on evaluation today patient actually appears to be doing worse at this point in time unfortunately compared to her last week's evaluation. She is having really no increased pain which is good news unfortunately she does have more maceration in your theme and noted surrounding the right lateral ankle the left lateral ankle is not really is erythematous I do not see signs of the overt cellulitis on that side. Unfortunately the wounds do not seem to have shown any signs of improvement since the last evaluation. She also has significant swelling especially on the right  compared to previous some of this may be due to infection however also think that she may be served better while she has these wounds by compression wrapping versus continuing to use the Juxta-Lite for the time being. Especially with the amount of drainage that she is experiencing at this point. No fevers, chills, nausea, or vomiting noted at this time. 05/31/18 on evaluation today patient appears to actually be doing better in regard to her right lateral lower extremity ulcer specifically on the malleolus region. She has been tolerating the antibiotic without complication. With that being said she still continues to have issues but a little bit of redness although nothing like she what she was experiencing previous. She still continues to pressure to her ankle area she did get the problem on offloading boots unfortunately she will not wear them she states there too uncomfortable and she can't get in and out of the bed. Nonetheless at this point her wounds seem to be continually getting worse which is not what we want I'm getting somewhat concerned about her progress and how things are going to proceed if we do not intervene in some way shape or form. I therefore had a very lengthy conversation today about offloading yet again and even made a specific suggestion for switching her to a memory foam mattress and even gave the information for a specific one that they could look at getting if it was something that they were interested in considering. She does not want to be considered for a hospital bed air mattress although honestly insurance would not cover it that she does not have any wounds on her trunk. 06/14/18 on evaluation today both wounds over the bilateral lateral malleolus her ulcers appear to be doing better there's no evidence of pressure injury at this point. She did get the foam mattress for her bed and this does seem to have been extremely beneficial for her in my pinion. Her daughter  states that she is having difficulty getting out of bed because of how soft it is. The patient also relates this to be. Nonetheless I do feel like she's actually doing better. Unfortunately right after and around the time she was getting the mattress she also sustained a fall when she got up to go pick up the phone and ended up injuring her right elbow she has 18 sutures in place. We are not caring for this currently although home health is going to be taking the sutures out shortly. Nonetheless this may be something that we need to evaluate going forward. It depends on how well it has or has not healed in the end. She also recently saw an orthopedic specialist for an injection in the right shoulder just before her fall unfortunately the fall seems to have worsened her pain. 06/21/18 on evaluation today patient appears to be doing about the same  in regard to her lateral malleolus ulcers. Both appear to be just a little bit deeper but again we are clinging away the necrotic and dead tissue which I think is why this is progressing towards a deeper realm as opposed to improving from my measurement standpoint in that regard. Nonetheless she has been tolerating the dressing changes she absolutely hates the memory foam mattress topper that was obtained for her nonetheless Jaia, Alonge [name] ([id number]) I do believe this is still doing excellent as far as taking care of excess pressure in regard to the lateral malleolus regions. She in fact has no pressure injury that I see whereas in weeks past it was week by week I was constantly seeing new pressure injuries. Overall I think it has been very beneficial for her. 07/03/18; patient arrives in my clinic today. She has deep punched out areas over her bilateral lateral malleoli. The area on the right has some more depth. We spent a lot of time today talking about pressure relief for these areas. This started when her daughter asked for a prescription for a  memory foam mattress. I have never written a prescription for a mattress and I don't think insurances would pay for that on an ordinary bed. In any case he came up that she has foam boots that she refuses to wear. I would suggest going to these before any other offloading issues when she is in bed. They say she is meticulous about offloading this the rest of the day 07/10/18- She is seen in follow-up evaluation for bilateral, lateral malleolus ulcers. There is no improvement in the ulcers. She has purchased and is sleeping on a memory foam mattress/overlay, she has been using the offloading boots nightly over the past week. She has a follow up appointment with vascular medicine at the end of October, in my opinion this follow up should be expedited given her deterioration and suboptimal TBI results. We will order plain film xray of the left ankle as deeper structures are palpable; would consider having MRI, regardless of xray report(s). The ulcers will be treated with iodoflex/iodosorb, she is unable to safely change the dressings daily with santyl. 07/19/18 on evaluation today patient appears to be doing in general visually well in regard to her bilateral lateral malleolus ulcers. She has been tolerating the dressing changes without complication which is good news. With that being said we did have an x-ray performed on 07/12/18 which revealed a slight loosen see in the lateral portion of the distal left fibula which may represent artifact but underline lytic destruction or osteomyelitis could not be excluded. MRI was recommended. With that being said we can see about getting the patient scheduled for an MRI to further evaluate this area. In fact we have that scheduled currently for August 20 19,019. 07/26/18 on evaluation today patient's wound on the right lateral ankle actually appears to be doing fairly well at this point in my pinion. She has made some good progress currently. With that being said  unfortunately in regard to the left lateral ankle ulcer this seems to be a little bit more problematic at this time. In fact as I further evaluated the situation she actually had bone exposed which is the first time that's been the case in the bone appear to be necrotic. Currently I did review patient's note from Dr. Bunnie Domino office with [facility]. He stated that ABI was 1.26 on the right and 0.95 on the left with good waveforms. Her  perfusion is stable not reduced from previous studies and her digital waveforms were pretty good particularly on the right. His conclusion upon review of the note was that there was not much she could do to improve her perfusion and he felt she was adequate for wound healing. His suggestion was that she continued to see [place] and consider a synthetic skin graft if there was no underlying infection. He plans to see her back in six months or as needed. 08/01/18 on evaluation today patient appears to be doing better in regard to her right lateral ankle ulcer. Her left lateral ankle ulcer is about the same she still has bone involvement in evidence of necrosis. There does not appear to be evidence of infection at this time On the right lateral lower extremity. I have started her on the Augmentin she picked this up and started this yesterday. This is to get her through until she sees infectious disease which is scheduled for 08/12/18. 08/06/18 on evaluation today patient appears to be doing rather well considering my discussion with patient's daughter at the end of last week. The area which was marked where she had erythema seems to be improved and this is good news. With that being said overall the patient seems to be making good improvement when it comes to the overall appearance of the right lateral ankle ulcer although this has been slow she at least is coming around in this regard. Unfortunately in regard to the left lateral ankle ulcer this is  osteomyelitis based on the pathology report as well is bone culture. Nonetheless we are still waiting CT scan. Unfortunately the MRI we originally ordered cannot be performed as the patient is a pacemaker which I had overlooked. Nonetheless we are working on the CT scan approval and scheduling as of now. She did go to the hospital over the weekend and was placed on IV Cefzo for a couple of days. Fortunately this seems to have improved the erythema quite significantly which is good news. There does not appear to be any evidence of worsening infection at this time. She did have some bleeding after the last debridement therefore I did not perform any sharp debridement in regard to left lateral ankle at this point. Patient has been approved for a snap vac for the right lateral ankle. 08/14/18; the patient with wounds over her bilateral lateral malleoli. The area on the right actually looks quite good. Been using a snap back on this area. Healthy granulation and appears to be filling in. Unfortunately the area on the left is really problematic. 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 Also noted to have cellulitis. She saw Dr. Novella Olive of infectious disease today and was put on linezolid. We are able to verify this with her pharmacy. She is completed the Augmentin that she was already on. We've been using Iodoflex to this MARGENE, CHERIAN ([id number]) area 08/23/18 on evaluation today patient's wounds both actually appear to be doing better compared to my prior evaluations. Fortunately she showing signs of good improvement in regard to the overall wound status especially where were using the snap vac on the right. In regard to left lateral malleolus the wound bed actually appears to be much cleaner than previously noted. I do not feel any phone directly probed during evaluation today and though there is tendon noted this  does not appear to be necrotic it's actually fairly good as  far as the overall appearance of the tendon is concerned. In general the wound bed actually appears to be doing significantly better than it was previous. Patient is currently in the care of [name] and I did review that note today. He actually has her on two weeks of linezolid and then following the patient will be on 1-2 months of Keflex. That is the plan currently. She has been on antibiotics therapy as prescribed by myself initially starting on July 30, 2018 and has been on that continuously up to this point. 08/30/18 on evaluation today patient actually appears to be doing much better in regard to her right lateral malleolus ulcer. She has been tolerating the dressing changes specifically the snap vac without complication although she did have some issues with the seal currently. Apparently there was some trouble with getting it to maintain over the past week past Sunday. Nonetheless overall the wound appears better in regard to the right lateral malleolus region. In regard to left lateral malleolus this actually show some signs of additional granulation although there still tendon noted in the base of the wound this appears to be healthy not necrotic in any way whatsoever. We are considering potentially using a snap vac for the left lateral malleolus as well the product wrap from [employer], [employer], was present in the clinic today we're going to see this patient I did have her come in with me after obtaining consent from the patient and her daughter in order to look at the wound and see if there's any recommendation one way or another as to whether or not they felt the snapback could be beneficial for the left lateral malleolus region. But the conclusion was that it might be but that this is definitely a little bit deeper wound than what traditionally would be utilized for a snap vac. 09/06/18 on evaluation today patient actually appears  to be doing excellent in my pinion in regard to both ankle ulcers. She has been tolerating the dressing changes without complication which is great news. Specifically we have been using the snap vac. In regard to the right ankle I'm not even sure that this is going to be necessary for today and following as the wound has filled in quite nicely. In regard to the left ankle I do believe that we're seeing excellent epithelialization from the edge as well as granulation in the central portion the tendon is still exposed but there's no evidence of necrotic bone and in general I feel like the patient has made excellent progress even compared to last week with just one week of the snap vac. 09/11/18; this is a patient who has wounds on her bilateral lateral malleoli. Initially both of these were deep stage IV wounds in the setting of chronic arterial insufficiency. She has been revascularized. As I understand think she been using snap vacs to both of these wounds however the area on the right became more superficial and currently she is only using it on the left. Using silver collagen on the right and silver collagen under the back on the left I believe 09/19/18 on evaluation today patient actually appears to be doing very well in regard to her lateral malleolus or ulcers bilaterally. She has been tolerating the dressing changes without complication. Fortunately there does not appear to be any evidence of infection at this time. Overall I feel like she is improving in an excellent manner and I'm very pleased with the fact that everything seems to be turning  towards the better for her. This has obviously been a long road. 09/27/18 on evaluation today patient actually appears to be doing very well in regard to her bilateral lateral malleolus ulcers. She has been tolerating the dressing changes without complication. Fortunately there does not appear to be any evidence of infection at this time which is also  great news. No fevers, chills, nausea, or vomiting noted at this time. Overall I feel like she is doing excellent with the snap vac on the left malleolus. She had 40 mL of fluid collection over the past week. Patient History Information obtained from Patient. Family History Cancer - Father,Siblings, Heart Disease - Siblings, No family history of Diabetes, Hereditary Spherocytosis, Hypertension, Kidney Disease, Lung Disease, Seizures, Stroke, Thyroid Problems, Tuberculosis. Social History Never smoker, Marital Status - Married, Alcohol Use - Never, Drug Use - No History, Caffeine Use - Rarely. Review of Systems (ROS) Neider, [name] ([id number]) Constitutional Symptoms (General Health) Denies complaints or symptoms of Fever, Chills. Respiratory The patient has no complaints or symptoms. Cardiovascular Complains or has symptoms of LE edema. Objective Constitutional Well-nourished and well-hydrated in no acute distress. Vitals Time Taken: 11:03 AM, Height: 65 in, Weight: 154.3 lbs, BMI: 25.7, Temperature: 98.1 F, Pulse: 78 bpm, Respiratory Rate: 16 breaths/min, Blood Pressure: 132/59 mmHg. Respiratory normal breathing without difficulty. clear to auscultation bilaterally. Cardiovascular regular rate and rhythm with normal S1, S2. Psychiatric this patient is able to make decisions and demonstrates good insight into disease process. Alert and Oriented x 3. pleasant and cooperative. General Notes: Patient's wound beds currently are doing very well. There was some Slough noted on the left lateral malleolus. And go to the right lateral malleolus this actually appear to be doing excellent. No sharp debridement was required on the right the left did require sharp debridement to clean away the necrotic material. Post debridement the wound bed appears to be doing significantly better. The tendon is no longer directly visible at this point which is also excellent news. Integumentary (Hair,  Skin) Wound #1 status is Open. Original cause of wound was Gradually Appeared. The wound is located on the Right,Lateral Malleolus. The wound measures 0.6cm length x 0.5cm width x 0.3cm depth; 0.236cm^2 area and 0.071cm^3 volume. There is Fat Layer (Subcutaneous Tissue) Exposed exposed. There is no tunneling or undermining noted. There is a small amount of serous drainage noted. The wound margin is distinct with the outline attached to the wound base. There is medium (34- 66%) pink granulation within the wound bed. There is a small (1-33%) amount of necrotic tissue within the wound bed including Adherent Slough. The periwound skin appearance exhibited: Rubor. The periwound skin appearance did not exhibit: Callus, Crepitus, Excoriation, Induration, Rash, Scarring, Dry/Scaly, Maceration, Atrophie Blanche, Cyanosis, Ecchymosis, Hemosiderin Staining, Mottled, Pallor, Erythema. Periwound temperature was noted as No Abnormality. The periwound has tenderness on palpation. Wound #2 status is Open. Original cause of wound was Gradually Appeared. The wound is located on the Left,Lateral Malleolus. The wound measures 1.3cm length x 1cm width x 0.4cm depth; 1.021cm^2 area and 0.408cm^3 volume. There is Fat Layer (Subcutaneous Tissue) Exposed exposed. There is no tunneling or undermining noted. There is a medium amount of serosanguineous drainage noted. The wound margin is epibole. There is medium (34-66%) pink, pale granulation within the wound bed. There is a medium (34-66%) amount of necrotic tissue within the wound bed including Adherent Slough. The periwound skin appearance exhibited: Maceration. The periwound skin appearance did not exhibit: Callus, Crepitus, Excoriation, Induration, Rash,  Scarring, Dry/Scaly, Atrophie Blanche, Cyanosis, Ecchymosis, Hemosiderin Staining, Mottled, Casagrande, [name] ([id number]) Pallor, Rubor, Erythema. Periwound temperature was noted as No Abnormality. The periwound has  tenderness on palpation. 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 There was a Excisional Skin/Subcutaneous Tissue Debridement with a total area of 1.3 sq cm performed by STONE III, HOYT E., PA-C. With the following instrument(s): Curette to remove Viable and Non-Viable tissue/material. Material removed includes Subcutaneous Tissue and Slough and after achieving pain control using Lidocaine 4% Topical Solution. No specimens were taken. A time out was conducted at 11:31, prior to the start of the procedure. A Minimum amount of bleeding was controlled with Pressure. The procedure was tolerated well with a pain level of 0 throughout and a pain level of 0 following the procedure. Post Debridement Measurements: 1.3cm length x 1cm width x 0.6cm depth; 0.613cm^3 volume. Post debridement Stage noted as Category/Stage IV. Character of Wound/Ulcer Post Debridement is improved. Severity of Tissue Post Debridement is: Fat layer exposed. Post procedure Diagnosis Wound #2: Same as Pre-Procedure Plan Wound Cleansing: Wound #1 Right,Lateral Malleolus: Clean wound with Normal Saline. Cleanse wound with mild soap and water May Shower, gently pat wound dry prior to applying new dressing. Wound #2 Left,Lateral Malleolus: Clean wound with Normal Saline. Cleanse wound with mild soap and water May Shower, gently pat wound dry prior to applying new dressing. Anesthetic (add to Medication List): Wound #1 Right,Lateral  Malleolus: Topical Lidocaine 4% cream applied to wound bed prior to debridement (In Clinic Only). Wound #2 Left,Lateral Malleolus: Topical Lidocaine 4% cream applied to wound bed prior to debridement (In Clinic Only). MOYA, DUAN ([id number]) Primary Wound Dressing: Wound #1 Right,Lateral Malleolus: Silver Collagen Wound #2 Left,Lateral Malleolus: Silver Collagen Secondary Dressing: Wound #1 Right,Lateral Malleolus: Boardered Foam Dressing Drawtex Dressing Change Frequency: Wound #1 Right,Lateral Malleolus: Change Dressing Monday, Wednesday, Friday Follow-up Appointments: Wound #1 Right,Lateral Malleolus: Return Appointment in 1 week. Wound #2 Left,Lateral Malleolus: Return Appointment in 1 week. Edema Control: Wound #1 Right,Lateral Malleolus: Patient to wear own compression stockings Patient to wear own Velcro compression garment. Elevate legs to the level of the heart and pump ankles as often as possible Wound #2 Left,Lateral Malleolus: Patient to wear own compression stockings Patient to wear own Velcro compression garment. Elevate legs to the level of the heart and pump ankles as often as possible Off-Loading: Wound #1 Right,Lateral Malleolus: Turn and reposition every 2 hours Other: - do not put pressure on your ankles Wound #2 Left,Lateral Malleolus: Turn and reposition every 2 hours Other: - do not put pressure on your ankles Additional Orders / Instructions: Wound #1 Right,Lateral Malleolus: Increase protein intake. Wound #2 Left,Lateral Malleolus: Increase protein intake. Home Health: Wound #1 Right,Lateral Malleolus: [place] Visits - [place] Nurse may visit PRN to address patient s wound care needs. FACE TO FACE ENCOUNTER: MEDICARE and MEDICAID PATIENTS: I certify that this patient is under my care and that I had a face-to-face encounter that meets the physician face-to-face encounter requirements with this patient on this  date. The encounter with  the patient was in whole or in part for the following MEDICAL CONDITION: (primary reason for [place]) MEDICAL NECESSITY: I certify, that based on my findings, NURSING services are a medically necessary home health service. HOME BOUND STATUS: I certify that my clinical findings support that this patient is homebound (i.e., Due to illness or injury, pt requires aid of supportive devices such as crutches, cane, wheelchairs, walkers, the use of special transportation or the assistance of another person to leave their place of residence. There is a normal inability to leave the home and doing so requires considerable and taxing effort. Other absences are for medical reasons / religious services and are infrequent or of short duration when for other reasons). If current dressing causes regression in wound condition, may D/C ordered dressing product/s and apply Normal Saline Moist Dressing daily until next [place] / Other MD appointment. [place] of regression in wound condition at [phone]. Please direct any NON-WOUND related issues/requests for orders to patient's Primary Care Physician Wound #2 Left,Lateral Malleolus: [place] Visits - [place] Nurse may visit PRN to address patient s wound care needs. FACE TO FACE ENCOUNTER: MEDICARE and MEDICAID PATIENTS: I certify that this patient is under my care and that I had a face-to-face encounter that meets the physician face-to-face encounter requirements with this patient on this date. The GILBERTO, STRECK ([id number]) encounter with the patient was in whole or in part for the following MEDICAL CONDITION: (primary reason for [place]) MEDICAL NECESSITY: I certify, that based on my findings, NURSING services are a medically necessary home health service. HOME BOUND STATUS: I certify that my clinical findings support that this patient is homebound (i.e.,  Due to illness or injury, pt requires aid of supportive devices such as crutches, cane, wheelchairs, walkers, the use of special transportation or the assistance of another person to leave their place of residence. There is a normal inability to leave the home and doing so requires considerable and taxing effort. Other absences are for medical reasons / religious services and are infrequent or of short duration when for other reasons). If current dressing causes regression in wound condition, may D/C ordered dressing product/s and apply Normal Saline Moist Dressing daily until next [place] / Other MD appointment. [place] of regression in wound condition at [phone]. Please direct any NON-WOUND related issues/requests for orders to patient's Primary Care Physician Negative Pressure Wound Therapy: Wound #2 Left,Lateral Malleolus: Other: - SNAP VAC I'm gonna suggest currently that we continue with the above wound care measures for the next week. The patient is in agreement with plan. If anything changes or worsens in the meantime she will let me know. Otherwise we will see were things stand at follow-up. Please see above for specific wound care orders. We will see patient for re-evaluation in 1 week(s) here in the clinic. If anything worsens or changes patient will contact our office for additional recommendations. Electronic Signature(s) Signed: 09/27/2018 6:09:48 PM By: Worthy Keeler PA-C Entered By: Worthy Keeler on 09/27/2018 13:31:21 Desmith, [name] ([id number]) -------------------------------------------------------------------------------- ROS/PFSH Details Patient Name: [name] Date of Service: 09/27/2018 11:00 AM Medical Record Number: [id number] Patient Account Number: [id number] Date of Birth/Sex: 07/31/1923 (84 y.o. F) Treating RN: Montey Hora Primary Care Provider: Grayland Ormond Other Clinician: Referring Provider:  Grayland Ormond Treating Provider/Extender: Melburn Hake, HOYT Weeks in Treatment: 34 Information Obtained From Patient Wound History Do you currently have one  or more open woundso Yes How many open wounds do you currently haveo 1 Approximately how long have you had your woundso 2 yrs How have you been treating your wound(s) until nowo mupirocin, soaking in epsom salt Has your wound(s) ever healed and then re-openedo No Have you had any lab work done in the past montho No Have you tested positive for an antibiotic resistant organism (MRSA, VRE)o No Have you tested positive for osteomyelitis (bone infection)o No Have you had any tests for circulation on your legso Yes Who ordered the testo Dr. Lucky Cowboy [facility] Constitutional Symptoms (General Health) Complaints and Symptoms: Negative for: Fever; Chills Cardiovascular Complaints and Symptoms: Positive for: LE edema Medical History: Positive for: Congestive Heart Failure; Hypertension Eyes Medical History: Positive for: Cataracts - surgery Respiratory Complaints and Symptoms: No Complaints or Symptoms Musculoskeletal Medical History: Positive for: Osteoarthritis Neurologic Medical History: Positive for: Neuropathy Oncologic NIANG, MITCHELTREE ([id number]) Medical History: Negative for: Received Chemotherapy; Received Radiation HBO Extended History Items Eyes: Cataracts Immunizations Pneumococcal Vaccine: Received Pneumococcal Vaccination: Yes Implantable Devices Family and Social History Cancer: Yes - Father,Siblings; Diabetes: No; Heart Disease: Yes - Siblings; Hereditary Spherocytosis: No; Hypertension: No; Kidney Disease: No; Lung Disease: No; Seizures: No; Stroke: No; Thyroid Problems: No; Tuberculosis: No; Never smoker; Marital Status - Married; Alcohol Use: Never; Drug Use: No History; Caffeine Use: Rarely; Financial Concerns: No; Food, Clothing or Shelter Needs: No; Support System Lacking: No;  Transportation Concerns: No; Advanced Directives: No; Patient does not want information on Advanced Directives; Do not resuscitate: No; Living Will: Yes (Not Provided); Medical Power of Attorney: No Physician Affirmation I have reviewed and agree with the above information. Electronic Signature(s) Signed: 09/27/2018 5:35:44 PM By: Montey Hora Signed: 09/27/2018 6:09:48 PM By: Worthy Keeler PA-C Entered By: Worthy Keeler on 09/27/2018 13:30:30 Hoh, [name] ([id number]) -------------------------------------------------------------------------------- SuperBill Details Patient Name: [name] Date of Service: 09/27/2018 Medical Record Number: [id number] Patient Account Number: [id number] Date of Birth/Sex: 05/13/1923 (84 y.o. 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, left ankle and foot Facility Procedures CPT4 Code: [id number] Description: 85885 - DEB SUBQ TISSUE 20 SQ CM/< ICD-10 Diagnosis Description L89.524 Pressure ulcer of left ankle, stage 4 Modifier: Quantity: 1 Physician Procedures CPT4 Code: [id number] Description: 87867 - WC PHYS LEVEL 4 - NEW PT ICD-10 Diagnosis Description 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

## 2018-10-01 ENCOUNTER — Encounter (INDEPENDENT_AMBULATORY_CARE_PROVIDER_SITE_OTHER): Payer: Medicare Other

## 2018-10-01 ENCOUNTER — Ambulatory Visit (INDEPENDENT_AMBULATORY_CARE_PROVIDER_SITE_OTHER): Payer: Medicare Other | Admitting: Vascular Surgery

## 2018-10-01 NOTE — Progress Notes (Signed)
[name], [name] ([id number]) Visit Report for 09/27/2018 Arrival Information Details Patient Name: [name], [name] Date of Service: 09/27/2018 11:00 AM Medical Record Number: [id number] Patient Account Number: [id number] Date of Birth/Sex: 1923/09/21 (84 y.o. F) Treating RN: Montey Hora Primary Care Tiea Manninen: Grayland Ormond Other Clinician: Referring Markeisha Mancias: Grayland Ormond Treating Anwen Cannedy/Extender: Melburn Hake, HOYT Weeks in Treatment: 65 Visit Information History Since Last Visit Added or deleted any medications: No Patient Arrived: Cane Any new allergies or adverse reactions: No Arrival Time: 11:04 Had a fall or experienced change in No Accompanied By: self activities of daily living that may affect Transfer Assistance: None risk of falls: Patient Identification Verified: Yes Signs or symptoms of abuse/neglect since last visito No Secondary Verification Process Yes Hospitalized since last visit: No Completed: Implantable device outside of the clinic excluding No Patient Requires Transmission-Based No cellular tissue based products placed in the center Precautions: since last visit: Patient Has Alerts: Yes Has Dressing in Place as Prescribed: Yes Patient Alerts: ABI AVVS 03/29/18 Pain Present Now: No L 1.05 R 1.03, TBI L .56, R .85 ABI AVVS 07/23/18 L .95 R 1.26 Electronic Signature(s) Signed: 09/27/2018 2:09:27 PM By: Lorine Bears RCP, RRT, CHT Entered By: Lorine Bears on 09/27/2018 11:04:58 Spillane, [name] ([id number]) -------------------------------------------------------------------------------- Lower Extremity Assessment Details Patient Name: [name] Date of Service: 09/27/2018 11:00 AM Medical Record Number: [id number] Patient Account Number: [id number] Date of Birth/Sex: Jul 13, 1923 (84 y.o. F) Treating RN: Cornell Barman Primary Care Hermina Barnard: Grayland Ormond Other Clinician: Referring Whitfield Dulay:  Grayland Ormond Treating Pluma Diniz/Extender: Melburn Hake, HOYT Weeks in Treatment: 59 Edema Assessment Assessed: [Left: No] [Right: No] [Left: Edema] [Right: :] Calf Left: Right: Point of Measurement: 29 cm From Medial Instep 32 cm 28.5 cm Ankle Left: Right: Point of Measurement: 10 cm From Medial Instep 22 cm 23 cm Vascular Assessment Pulses: Dorsalis Pedis Palpable: [Left:Yes] [Right:Yes] Posterior Tibial Extremity colors, hair growth, and conditions: Extremity Color: [Left:Hyperpigmented] [Right:Hyperpigmented] Hair Growth on Extremity: [Left:No] [Right:No] Temperature of Extremity: [Left:Warm] [Right:Warm] Capillary Refill: [Left:< 3 seconds] [Right:< 3 seconds] Toe Nail Assessment Left: Right: Thick: No No Discolored: No No Deformed: No No Improper Length and Hygiene: No No Electronic Signature(s) Signed: 09/30/2018 3:24:13 PM By: Gretta Cool, BSN, RN, CWS, Kim RN, BSN Entered By: Gretta Cool, BSN, RN, CWS, Kim on 09/27/2018 11:18:34 Curless, [name] ([id number]) -------------------------------------------------------------------------------- Multi Wound Chart Details Patient Name: [name] Date of Service: 09/27/2018 11:00 AM Medical Record Number: [id number] Patient Account Number: [id number] Date of Birth/Sex: 1923-06-02 (84 y.o. F) Treating RN: Montey Hora Primary Care Terena Bohan: Grayland Ormond Other Clinician: Referring Chalet Kerwin: Grayland Ormond Treating Sneha Willig/Extender: STONE III, HOYT Weeks in Treatment: 59 Vital Signs Height(in): 65 Pulse(bpm): 78 Weight(lbs): 154.3 Blood Pressure(mmHg): 132/59 Body Mass Index(BMI): 26 Temperature(F): 98.1 Respiratory Rate 16 (breaths/min): Photos: [1:No Photos] [2:No Photos] [N/A:N/A] Wound Location: [1:Right Malleolus - Lateral] [2:Left Malleolus - Lateral] [N/A:N/A] Wounding Event: [1:Gradually Appeared] [2:Gradually Appeared] [N/A:N/A] Primary Etiology: [1:Pressure Ulcer] [2:Pressure Ulcer]  [N/A:N/A] Secondary Etiology: [1:Arterial Insufficiency Ulcer] [2:Arterial Insufficiency Ulcer] [N/A:N/A] Comorbid History: [1:Cataracts, Congestive Heart Failure, Hypertension, Osteoarthritis, Neuropathy] [2:Cataracts, Congestive Heart Failure, Hypertension, Osteoarthritis, Neuropathy] [N/A:N/A] Date Acquired: [1:08/11/2015] [2:04/12/2018] [N/A:N/A] Weeks of Treatment: [1:59] [2:23] [N/A:N/A] Wound Status: [1:Open] [2:Open] [N/A:N/A] Measurements L x W x D [1:0.6x0.5x0.3] [2:1.3x1x0.4] [N/A:N/A] (cm) Area (cm) : [1:0.236] [2:1.021] [N/A:N/A] Volume (cm) : [1:0.071] [2:0.408] [N/A:N/A] % Reduction in Area: [1:84.60%] [2:-30.10%] [N/A:N/A] % Reduction in Volume: [1:84.50%] [2:-416.50%] [N/A:N/A] Classification: [1:Category/Stage IV] [2:Category/Stage IV] [N/A:N/A] Exudate Amount: [1:Small] [2:Medium] [N/A:N/A] Exudate  Type: [1:Serous] [2:Serosanguineous] [N/A:N/A] Exudate Color: [1:amber] [2:red, brown] [N/A:N/A] Wound Margin: [1:Distinct, outline attached] [2:Epibole] [N/A:N/A] Granulation Amount: [1:Medium (34-66%)] [2:Medium (34-66%)] [N/A:N/A] Granulation Quality: [1:Pink] [2:Pink, Pale] [N/A:N/A] Necrotic Amount: [1:Small (1-33%)] [2:Medium (34-66%)] [N/A:N/A] Exposed Structures: [1:Fat Layer (Subcutaneous Tissue) Exposed: Yes Fascia: No Tendon: No Muscle: No Joint: No Bone: No] [2:Fat Layer (Subcutaneous Tissue) Exposed: Yes Fascia: No Tendon: No Muscle: No Joint: No Bone: No] [N/A:N/A] Epithelialization: [1:Medium (34-66%)] [2:None] [N/A:N/A] Periwound Skin Texture: [1:Excoriation: No Induration: No Callus: No Crepitus: No] [2:Excoriation: No Induration: No Callus: No Crepitus: No] [N/A:N/A] Rash: No Rash: No Scarring: No Scarring: No Periwound Skin Moisture: Maceration: No Maceration: Yes N/A Dry/Scaly: No Dry/Scaly: No Periwound Skin Color: Rubor: Yes Atrophie Blanche: No N/A Atrophie Blanche: No Cyanosis: No Cyanosis: No Ecchymosis: No Ecchymosis: No Erythema:  No Erythema: No Hemosiderin Staining: No Hemosiderin Staining: No Mottled: No Mottled: No Pallor: No Pallor: No Rubor: No Temperature: No Abnormality No Abnormality N/A Tenderness on Palpation: Yes Yes N/A Wound Preparation: Ulcer Cleansing: Ulcer Cleansing: N/A Rinsed/Irrigated with Saline Rinsed/Irrigated with Saline Topical Anesthetic Applied: Topical Anesthetic Applied: Other: lidocaine 4% Other: lidocaine 4% Treatment Notes Electronic Signature(s) Signed: 09/27/2018 5:35:44 PM By: Montey Hora Entered By: Montey Hora on 09/27/2018 11:29:59 Scheier, [name] ([id number]) -------------------------------------------------------------------------------- [place] Details Patient Name: [name] Date of Service: 09/27/2018 11:00 AM Medical Record Number: [id number] Patient Account Number: [id number] Date of Birth/Sex: March 05, 1923 (84 y.o. F) Treating RN: Montey Hora Primary Care Hart Haas: Grayland Ormond Other Clinician: Referring Cesily Cuoco: Grayland Ormond Treating Tyshell Ramberg/Extender: Melburn Hake, HOYT Weeks in Treatment: 61 Active Inactive ` Abuse / Safety / Falls / Self Care Management Nursing Diagnoses: Potential for falls Goals: Patient will not experience any injury related to falls Date Initiated: 08/10/2017 Target Resolution Date: 11/10/2017 Goal Status: Active Interventions: Assess Activities of Daily Living upon admission and as needed Assess fall risk on admission and as needed Assess: immobility, friction, shearing, incontinence upon admission and as needed Notes: ` Nutrition Nursing Diagnoses: Imbalanced nutrition Potential for alteratiion in Nutrition/Potential for imbalanced nutrition Goals: Patient/caregiver agrees to and verbalizes understanding of need to use nutritional supplements and/or vitamins as prescribed Date Initiated: 08/10/2017 Target Resolution Date: 12/08/2017 Goal Status: Active Interventions: Assess  patient nutrition upon admission and as needed per policy Notes: ` Orientation to the Wound Care Program Nursing Diagnoses: Knowledge deficit related to the wound healing center program Goals: Patient/caregiver will verbalize understanding of the [facility] Date Initiated: 08/10/2017 Target Resolution Date: 09/08/2017 [name], [name] ([id number]) Goal Status: Active Interventions: Provide education on orientation to the wound center Notes: ` Pain, Acute or Chronic Nursing Diagnoses: Pain, acute or chronic: actual or potential Potential alteration in comfort, pain Goals: Patient/caregiver will verbalize adequate pain control between visits Date Initiated: 08/10/2017 Target Resolution Date: 12/08/2017 Goal Status: Active Interventions: Complete pain assessment as per visit requirements Notes: ` Wound/Skin Impairment Nursing Diagnoses: Impaired tissue integrity Knowledge deficit related to ulceration/compromised skin integrity Goals: Ulcer/skin breakdown will have a volume reduction of 80% by week 12 Date Initiated: 08/10/2017 Target Resolution Date: 12/01/2017 Goal Status: Active Interventions: Assess patient/caregiver ability to perform ulcer/skin care regimen upon admission and as needed Notes: Electronic Signature(s) Signed: 09/27/2018 5:35:44 PM By: Montey Hora Entered By: Montey Hora on 09/27/2018 11:29:51 Therrien, [name] ([id number]) -------------------------------------------------------------------------------- Pain Assessment Details Patient Name: [name] Date of Service: 09/27/2018 11:00 AM Medical Record Number: [id number] Patient Account Number: [id number] Date of Birth/Sex: May 15, 1923 (84 y.o. F) Treating RN: Montey Hora  Primary Care Lavere Shinsky: Grayland Ormond Other Clinician: Referring Arloa Prak: Grayland Ormond Treating Taler Kushner/Extender: Melburn Hake, HOYT Weeks in Treatment: 4 Active Problems Location of Pain  Severity and Description of Pain Patient Has Paino No Site Locations Pain Management and Medication Current Pain Management: Electronic Signature(s) Signed: 09/27/2018 2:09:27 PM By: Lorine Bears RCP, RRT, CHT Signed: 09/27/2018 5:35:44 PM By: Montey Hora Entered By: Lorine Bears on 09/27/2018 11:05:05 Mccready, [name] ([id number]) -------------------------------------------------------------------------------- Patient/Caregiver Education Details Patient Name: [name] Date of Service: 09/27/2018 11:00 AM Medical Record Number: [id number] Patient Account Number: [id number] Date of Birth/Gender: Nov 07, 1923 (84 y.o. F) Treating RN: Montey Hora Primary Care Physician: Grayland Ormond Other Clinician: Referring Physician: Grayland Ormond Treating Physician/Extender: Sharalyn Ink in Treatment: 50 Education Assessment Education Provided To: Patient and Caregiver Education Topics Provided Wound/Skin Impairment: Handouts: Other: SNAP and wound care as ordered Methods: Demonstration, Explain/Verbal Responses: State content correctly Electronic Signature(s) Signed: 09/27/2018 5:35:44 PM By: Montey Hora Entered By: Montey Hora on 09/27/2018 11:34:33 Benevides, [name] ([id number]) -------------------------------------------------------------------------------- Wound Assessment Details Patient Name: [name] Date of Service: 09/27/2018 11:00 AM Medical Record Number: [id number] Patient Account Number: [id number] Date of Birth/Sex: [birth year] (84 y.o. F) Treating RN: Cornell Barman Primary Care Jhovani Griswold: Grayland Ormond Other Clinician: Referring Tona Qualley: Grayland Ormond Treating Quentavious Rittenhouse/Extender: Melburn Hake, HOYT Weeks in Treatment: 59 Wound Status Wound Number: 1 Primary Pressure Ulcer Etiology: Wound Location: Right Malleolus - Lateral Secondary Arterial Insufficiency Ulcer Wounding Event: Gradually  Appeared Etiology: Date Acquired: 08/11/2015 Wound Status: Open Weeks Of Treatment: 59 Comorbid Cataracts, Congestive Heart Failure, Clustered Wound: No History: Hypertension, Osteoarthritis, Neuropathy Wound Measurements Length: (cm) 0.6 Width: (cm) 0.5 Depth: (cm) 0.3 Area: (cm) 0.236 Volume: (cm) 0.071 % Reduction in Area: 84.6% % Reduction in Volume: 84.5% Epithelialization: Medium (34-66%) Tunneling: No Undermining: No Wound Description Classification: Category/Stage IV Wound Margin: Distinct, outline attached Exudate Amount: Small Exudate Type: Serous Exudate Color: amber Foul Odor After Cleansing: No Slough/Fibrino Yes Wound Bed Granulation Amount: Medium (34-66%) Exposed Structure Granulation Quality: Pink Fascia Exposed: No Necrotic Amount: Small (1-33%) Fat Layer (Subcutaneous Tissue) Exposed: Yes Necrotic Quality: Adherent Slough Tendon Exposed: No Muscle Exposed: No Joint Exposed: No Bone Exposed: No Periwound Skin Texture Texture Color No Abnormalities Noted: No No Abnormalities Noted: No Callus: No Atrophie Blanche: No Crepitus: No Cyanosis: No Excoriation: No Ecchymosis: No Induration: No Erythema: No Rash: No Hemosiderin Staining: No Scarring: No Mottled: No Pallor: No Moisture Rubor: Yes No Abnormalities Noted: No Dry / Scaly: No Temperature / Pain Maceration: No Temperature: No Abnormality Markson, [name] ([id number]) Tenderness on Palpation: Yes Wound Preparation Ulcer Cleansing: Rinsed/Irrigated with Saline Topical Anesthetic Applied: Other: lidocaine 4%, Electronic Signature(s) Signed: 09/30/2018 3:24:13 PM By: Gretta Cool, BSN, RN, CWS, Kim RN, BSN Entered By: Gretta Cool, BSN, RN, CWS, Kim on 09/27/2018 11:15:09 Longbottom, [name] ([id number]) -------------------------------------------------------------------------------- Wound Assessment Details Patient Name: [name] Date of Service: 09/27/2018 11:00 AM Medical  Record Number: [id number] Patient Account Number: [id number] Date of Birth/Sex: 08-May-1923 (84 y.o. F) Treating RN: Cornell Barman Primary Care Ronrico Dupin: Grayland Ormond Other Clinician: Referring Makenzi Bannister: Grayland Ormond Treating Guadalupe Nickless/Extender: Melburn Hake, HOYT Weeks in Treatment: 59 Wound Status Wound Number: 2 Primary Pressure Ulcer Etiology: Wound Location: Left Malleolus - Lateral Secondary Arterial Insufficiency Ulcer Wounding Event: Gradually Appeared Etiology: Date Acquired: 04/12/2018 Wound Status: Open Weeks Of Treatment: 23 Comorbid Cataracts, Congestive Heart Failure, Clustered Wound: No History: Hypertension, Osteoarthritis, Neuropathy Wound Measurements Length: (cm) 1.3 Width: (cm) 1 Depth: (cm) 0.4  Area: (cm) 1.021 Volume: (cm) 0.408 % Reduction in Area: -30.1% % Reduction in Volume: -416.5% Epithelialization: None Tunneling: No Undermining: No Wound Description Classification: Category/Stage IV Foul Od Wound Margin: Epibole Slough/ Exudate Amount: Medium Exudate Type: Serosanguineous Exudate Color: red, brown or After Cleansing: No Fibrino Yes Wound Bed Granulation Amount: Medium (34-66%) Exposed Structure Granulation Quality: Pink, Pale Fascia Exposed: No Necrotic Amount: Medium (34-66%) Fat Layer (Subcutaneous Tissue) Exposed: Yes Necrotic Quality: Adherent Slough Tendon Exposed: No Muscle Exposed: No Joint Exposed: No Bone Exposed: No Periwound Skin Texture Texture Color No Abnormalities Noted: No No Abnormalities Noted: No Callus: No Atrophie Blanche: No Crepitus: No Cyanosis: No Excoriation: No Ecchymosis: No Induration: No Erythema: No Rash: No Hemosiderin Staining: No Scarring: No Mottled: No Pallor: No Moisture Rubor: No No Abnormalities Noted: No Dry / Scaly: No Temperature / Pain Maceration: Yes Temperature: No Abnormality Mesina, [name] ([id number]) Tenderness on Palpation: Yes Wound Preparation Ulcer  Cleansing: Rinsed/Irrigated with Saline Topical Anesthetic Applied: Other: lidocaine 4%, Electronic Signature(s) Signed: 09/30/2018 3:24:13 PM By: Gretta Cool, BSN, RN, CWS, Kim RN, BSN Entered By: Gretta Cool, BSN, RN, CWS, Kim on 09/27/2018 11:15:49 Joni Fears, [name] ([id number]) -------------------------------------------------------------------------------- [place] Details Patient Name: [name] Date of Service: 09/27/2018 11:00 AM Medical Record Number: [id number] Patient Account Number: [id number] Date of Birth/Sex: 1923-08-20 (84 y.o. F) Treating RN: Montey Hora Primary Care Jasai Sorg: Grayland Ormond Other Clinician: Referring Chiquetta Langner: Grayland Ormond Treating Ashani Pumphrey/Extender: Melburn Hake, HOYT Weeks in Treatment: 59 Vital Signs Time Taken: 11:03 Temperature (F): 98.1 Height (in): 65 Pulse (bpm): 78 Weight (lbs): 154.3 Respiratory Rate (breaths/min): 16 Body Mass Index (BMI): 25.7 Blood Pressure (mmHg): 132/59 Reference Range: 80 - 120 mg / dl Electronic Signature(s) Signed: 09/27/2018 2:09:27 PM By: Lorine Bears RCP, RRT, CHT Entered By: Lorine Bears on 09/27/2018 11:07:20

## 2018-10-04 ENCOUNTER — Encounter: Payer: Medicare Other | Attending: Physician Assistant | Admitting: Physician Assistant

## 2018-10-04 DIAGNOSIS — L89524 Pressure ulcer of left ankle, stage 4: Secondary | ICD-10-CM | POA: Diagnosis not present

## 2018-10-04 DIAGNOSIS — I70233 Atherosclerosis of native arteries of right leg with ulceration of ankle: Secondary | ICD-10-CM | POA: Diagnosis not present

## 2018-10-04 DIAGNOSIS — I70243 Atherosclerosis of native arteries of left leg with ulceration of ankle: Secondary | ICD-10-CM | POA: Diagnosis not present

## 2018-10-04 DIAGNOSIS — Z950 Presence of cardiac pacemaker: Secondary | ICD-10-CM | POA: Insufficient documentation

## 2018-10-04 DIAGNOSIS — L89514 Pressure ulcer of right ankle, stage 4: Secondary | ICD-10-CM | POA: Insufficient documentation

## 2018-10-04 DIAGNOSIS — M86372 Chronic multifocal osteomyelitis, left ankle and foot: Secondary | ICD-10-CM | POA: Insufficient documentation

## 2018-10-07 ENCOUNTER — Encounter: Payer: Self-pay | Admitting: Podiatry

## 2018-10-07 ENCOUNTER — Ambulatory Visit (INDEPENDENT_AMBULATORY_CARE_PROVIDER_SITE_OTHER): Payer: Medicare Other | Admitting: Podiatry

## 2018-10-07 DIAGNOSIS — M79676 Pain in unspecified toe(s): Secondary | ICD-10-CM

## 2018-10-07 DIAGNOSIS — I739 Peripheral vascular disease, unspecified: Secondary | ICD-10-CM

## 2018-10-07 DIAGNOSIS — B351 Tinea unguium: Secondary | ICD-10-CM | POA: Diagnosis not present

## 2018-10-07 DIAGNOSIS — L909 Atrophic disorder of skin, unspecified: Secondary | ICD-10-CM

## 2018-10-07 DIAGNOSIS — R238 Other skin changes: Secondary | ICD-10-CM

## 2018-10-07 DIAGNOSIS — G629 Polyneuropathy, unspecified: Secondary | ICD-10-CM | POA: Diagnosis not present

## 2018-10-07 NOTE — Progress Notes (Signed)
Complaint:  Visit Type: Patient returns to my office for continued preventative foot care services. Complaint: Patient states" my nails have grown long and thick and become painful to walk and wear shoes" Patient has been diagnosed with PVD and is being treated for legs ulcers  B/L. The patient presents for preventative foot care services. No changes to ROS  Podiatric Exam: Vascular: dorsalis pedis and posterior tibial pulses are weakly  palpable bilateral. Capillary return is immediate. Temperature gradient is WNL. Skin turgor WNL  Sensorium: Normal Semmes Weinstein monofilament test. Normal tactile sensation bilaterally. Nail Exam: Pt has thick disfigured discolored nails with subungual debris noted bilateral entire nail hallux through fifth toenails Ulcer Exam: Ulcers noted ankle/leg  B/L.Marland Kitchen Orthopedic Exam: Muscle tone and strength are WNL. No limitations in general ROM. No crepitus or effusions noted. Foot type and digits show no abnormalities. Bony prominences are unremarkable. Skin: No Porokeratosis. No infection or ulcers.  There is skin breakdown posterior aspect left heel.  No infection.  Diagnosis:  Onychomycosis, , Pain in right toe, pain in left toes  Treatment & Plan Procedures and Treatment: Consent by patient was obtained for treatment procedures.   Debridement of mycotic and hypertrophic toenails, 1 through 5 bilateral and clearing of subungual debris. No ulceration, no infection noted. She is under treatment for ulcers  Neosporin/DSD applied to left heel.  Told her to use pillows  B/L. Return Visit-Office Procedure: Patient instructed to return to the office for a follow up visit 3 months for continued evaluation and treatment.    Gardiner Barefoot DPM

## 2018-10-08 NOTE — Progress Notes (Signed)
[name], [name] Visit Report for 10/04/2018 Chief Complaint Document Details Patient Name: [name], [name]. Date of Service: 10/04/2018 11:00 AM Medical Record Number: [id number] Patient Account Number: [id number] Date of Birth/Sex: 10-13-1923 (84 y.o. F) Treating RN: Montey Hora Primary Care Provider: Grayland Ormond Other Clinician: Referring Provider: Grayland Ormond Treating Provider/Extender: Melburn Hake, Alivea Gladson Weeks in Treatment: 60 Information Obtained from: Patient Chief Complaint Patient is here for right lateral malleolus and left lateral malleolus ulcer Electronic Signature(s) Signed: 10/07/2018 9:27:58 AM By: Worthy Keeler PA-C Entered By: Worthy Keeler on 10/04/2018 12:52:15 Rittenberry, [name] ([id number]) -------------------------------------------------------------------------------- Debridement Details Patient Name: [name] Date of Service: 10/04/2018 11:00 AM Medical Record Number: [id number] Patient Account Number: [id number] Date of Birth/Sex: [birth year] (84 y.o. F) Treating RN: Montey Hora Primary Care Provider: Grayland Ormond Other Clinician: Referring Provider: Grayland Ormond Treating Provider/Extender: Melburn Hake, Darnesha Diloreto Weeks in Treatment: 60 Debridement Performed for Wound #2 Left,Lateral Malleolus Assessment: Performed By: Physician STONE III, Stefania Goulart E., PA-C Debridement Type: Debridement Severity of Tissue Pre Fat layer exposed Debridement: Level of Consciousness (Pre- Awake and Alert procedure): Pre-procedure Verification/Time Yes - 11:50 Out Taken: Start Time: 11:50 Pain Control: Lidocaine 4% Topical Solution Total Area Debrided (L x W): 1.3 (cm) x 1 (cm) = 1.3 (cm) Tissue and other material Viable, Non-Viable, Slough, Subcutaneous, Slough debrided: Level: Skin/Subcutaneous Tissue Debridement Description: Excisional Instrument: Curette Bleeding: Minimum Hemostasis Achieved: Pressure End Time:  11:53 Procedural Pain: 0 Post Procedural Pain: 0 Response to Treatment: Procedure was tolerated well Level of Consciousness Awake and Alert (Post-procedure): Post Debridement Measurements of Total Wound Length: (cm) 1.3 Stage: Category/Stage IV Width: (cm) 1 Depth: (cm) 0.6 Volume: (cm) 0.613 Character of Wound/Ulcer Post Improved Debridement: Severity of Tissue Post Debridement: Fat layer exposed Post Procedure Diagnosis Same as Pre-procedure Electronic Signature(s) Signed: 10/04/2018 5:38:25 PM By: Montey Hora Signed: 10/07/2018 9:27:58 AM By: Worthy Keeler PA-C Entered By: Montey Hora on 10/04/2018 11:53:26 Divirgilio, [name] ([id number]) -------------------------------------------------------------------------------- HPI Details Patient Name: [name] Date of Service: 10/04/2018 11:00 AM Medical Record Number: [id number] Patient Account Number: [id number] Date of Birth/Sex: [birth year] (84 y.o. 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 Dr. Amalia Hailey the podiatrist saw her on 07/20/2017 and sent her to the wound center for possible hyperbaric oxygen therapy. past medical history of peripheral vascular disease, varicose veins, status post appendectomy, basal cell carcinoma excision from the left leg, cholecystectomy, pacemaker placement, right lower extremity angiography done by Dr. dew in March 2017 with placement of a stent. there is also note of a successful ablation of the right small saphenous vein done which was reviewed by ultrasound on 10/24/2016. the patient had a right  small saphenous vein ablation done on 10/20/2016. The patient has never been a smoker. She has been seen by Dr. Corene Cornea dew the vascular surgeon who most recently saw her on 06/15/2017 for evaluation of ongoing problems with right leg swelling. She had a lower extremity arterial duplex examination done(02/13/17) which showed patent distal right superficial femoral artery stent and above-the-knee popliteal stent without evidence of restenosis. The ABI was more than 1.3 on the right and more than 1.3 on the left. This was  consistent with noncompressible arteries due to medial calcification. The right great toe pressure and PPG waveforms are within normal limits and the left great toe pressure and PPG waveforms are decreased. he recommended she continue to wear her compression stockings and continue with elevation. She is scheduled to have a noninvasive arterial study in the near future 08/16/2017 -- had a lower extremity arterial duplex examination done which showed patent distal right superficial femoral artery stent and above-the-knee popliteal stent without evidence of restenosis. The ABI was more than 1.3 on the right and more than 1.3 on the left. This was consistent with noncompressible arteries due to medial calcification. The right great toe pressure and PPG waveforms are within normal limits and the left great toe pressure and PPG waveforms are decreased. the x-ray of the right ankle has not yet been done 08/24/2017 -- had a right ankle x-ray -- IMPRESSION:1. No fracture, bone lesion or evidence of osteomyelitis. 2. Lateral soft tissue swelling with a soft tissue ulcer. she has not yet seen the vascular surgeon for review 08/31/17 on evaluation today patient's wound appears to be showing signs of improvement. She still with her appointment with vascular in order to review her results of her vascular study and then determine if any intervention would be recommended at that time. No fevers,  chills, nausea, or vomiting noted at this time. She has been tolerating the dressing changes without complication. 09/28/17 on evaluation today patient's wound appears to show signs of good improvement in regard to the granulation tissue which is surfacing. There is still a layer of slough covering the wound and the posterior portion is still significantly deeper than the anterior nonetheless there has been some good sign of things moving towards the better. She is going to go back to Dr. dew for reevaluation to ensure her blood flow is still appropriate. That will be before her next evaluation with [place] next week. No fevers, chills, nausea, or vomiting noted at this time. Patient does have some discomfort rated to be a 3-4/10 depending on activity specifically cleansing the wound makes it worse. 10/05/2017 -- the patient was seen by Dr. Lucky Cowboy last week and noninvasive studies showed a normal right ABI with brisk triphasic waveforms consistent with no arterial insufficiency including normal digital pressures. The duplex showed a patent distal right SFA stent and the proximal SFA was also normal. He was pleased with her test and thought she should have enough of perfusion for normal wound healing. He would see her back in 6 months time. 12/21/17 on evaluation today patient appears to be doing fairly well in regard to her right lateral ankle wound. Unfortunately the main issue that she is expansion at this point is that she is having some issues with what appears to be some cellulitis in the [name], [name]. ([id number]) right anterior shin. She has also been noting a little bit of uncomfortable feeling especially last night and her ankle area. I'm afraid that she made the developing a little bit of an infection. With that being said I think it is in the early stages. 12/28/17 on evaluation today patient's ankle appears to be doing excellent. She's making good progress at this point the cellulitis seems  to have improved after last week's evaluation. Overall she is having no significant discomfort which is excellent news. She does have an appointment with Dr. dew on March 29, 2018 for reevaluation in regard to the stent he placed. She seems to have excellent blood flow in the  right lower extremity. 01/19/12 on evaluation today patient's wound appears to be doing very well. In fact she does not appear to require debridement at this point, there's no evidence of infection, and overall from the standpoint of the wound she seems to be doing very well. With that being said I believe that it may be time to switch to different dressing away from the [facility] Dressing she tells me she does have a lot going on her friend actually passed away yesterday and she's also having a lot of issues with her husband this obviously is weighing heavy on her as far as your thoughts and concerns today. 01/25/18 on evaluation today patient appears to be doing fairly well in regard to her right lateral malleolus. She has been tolerating the dressing changes without complication. Overall I feel like this is definitely showing signs of improvement as far as how the overall appearance of the wound is there's also evidence of epithelium start to migrate over the granulation tissue. In general I think that she is progressing nicely as far as the wound is concerned. The only concern she really has is whether or not we can switch to every other week visits in order to avoid having as many appointments as her daughters have a difficult time getting her to her appointments as well as the patient's husband to his he is not doing very well at this point. 02/22/18 on evaluation today patient's right lateral malleolus ulcer appears to be doing great. She has been tolerating the dressing changes without complication. Overall you making excellent progress at this time. Patient is having no significant discomfort. 03/15/18 on evaluation  today patient appears to be doing much more poorly in regard to her right lateral ankle ulcer at this point. Unfortunately since have last seen her her husband has passed just a few days ago is obviously weighed heavily on her her daughter also had surgery well she is with her today as usual. There does not appear to be any evidence of infection she does seem to have significant contusion/deep tissue injury to the right lateral malleolus which was not noted previous when I saw her last. It's hard to tell of exactly when this injury occurred although during the time she was spending the night in the hospital this may have been most likely. 03/22/18 on evaluation today patient appears to actually be doing very well in regard to her ulcer. She did unfortunately have a setback which was noted last week however the good news is we seem to be getting back on track and in fact the wound in the core did still have some necrotic tissue which will be addressed at this point today but in general I'm seeing signs that things are on the up and up. She is glad to hear this obviously she's been somewhat concerned that due to the how her wound digressed more recently. 03/29/18 on evaluation today patient appears to be doing fairly well in regard to her right lower extremity lateral malleolus ulcer. She unfortunately does have a new area of pressure injury over the inferior portion where the wound has opened up a little bit larger secondary to the pressure she seems to be getting. She does tell me sometimes when she sleeps at night that it actually hurts and does seem to be pushing on the area little bit more unfortunately. There does not appear to be any evidence of infection which is good news. She has been tolerating the dressing changes  without complication. She also did have some bruising in the left second and third toes due to the fact that she may have bump this or injured it although she has neuropathy so she  does not feel she did move recently that may have been where this came from. Nonetheless there does not appear to be any evidence of infection at this time. 04/12/18 on evaluation today patient's wound on the right lateral ankle actually appears to be doing a little bit better with a lot of necrotic docking tissue centrally loosening up in clearing away. However she does have the beginnings of a deep tissue injury on the left lateral malleolus likely due to the fact we've been trying offload the right as much as we have. I think she may benefit from an assistive soft device to help with offloading and it looks like they're looking at one of the doughnut conditions that wraps around the lower leg to offload which I think will definitely do a good job. With that being said I think we definitely need to address this issue on the left before it becomes a wound. Patient is not having significant pain. 04/19/18 on evaluation today patient appears to be doing excellent in regard to the progress she's made with her right lateral ankle ulcer. The left ankle region which did show evidence of a deep tissue injury seems to be resolving there's little fluid noted underneath and a blister there's nothing open at this point in time overall I feel like this is progressing nicely which is good news. She does not seem to be having significant discomfort at this point which is also good news. 04/25/18-She is here in follow up evaluation for bilateral lateral malleolar ulcers. The right lateral malleolus ulcer with pale subcutaneous tissue exposure, central area of ulcer with tendon/periosteum exposed. The left lateral malleolus ulcer now with Parady, [name] ([id number]) central area of nonviable tissue, otherwise deep tissue injury. She is wearing compression wraps to the left lower extremity, she will place the right lower extremity compression wraps on when she gets home. She will be out of town over the  weekend and return next week and follow-up appointment. She completed her doxycycline this morning 05/03/18 on evaluation today patient appears to be doing very well in regard to her right lateral ankle ulcer in general. At least she's showing some signs of improvement in this regard. Unfortunately she has some additional injury to the left lateral malleolus region which appears to be new likely even over the past several days. Again this determination is based on the overall appearance. With that being said the patient is obviously frustrated about this currently. 05/10/18-She is here in follow-up evaluation for bilateral lateral malleolar ulcers. She states she has purchased offloading shoes/boots and they will arrive tomorrow. She was asked to bring them in the office at next week's appointment so her provider is aware of product being utilized. She continues to sleep on right or left side, she has been encouraged to sleep on her back. The right lateral malleolus ulcer is precariously close to peri-osteum; will order xray. The left lateral malleolus ulcer is improved. Will switch back to santyl; she will follow up next week. 05/17/18 on evaluation today patient actually appears to be doing very well in regard to her malleolus her ulcers compared to last time I saw them. She does not seem to have as much in the way of contusion at this point which is great news. With that being  said she does continue to have discomfort and I do believe that she is still continuing to benefit from the offloading/pressure reducing boots that were recommended. I think this is the key to trying to get this to heal up completely. 05/24/18 on evaluation today patient actually appears to be doing worse at this point in time unfortunately compared to her last week's evaluation. She is having really no increased pain which is good news unfortunately she does have more maceration in your theme and noted surrounding the right  lateral ankle the left lateral ankle is not really is erythematous I do not see signs of the overt cellulitis on that side. Unfortunately the wounds do not seem to have shown any signs of improvement since the last evaluation. She also has significant swelling especially on the right compared to previous some of this may be due to infection however also think that she may be served better while she has these wounds by compression wrapping versus continuing to use the Juxta-Lite for the time being. Especially with the amount of drainage that she is experiencing at this point. No fevers, chills, nausea, or vomiting noted at this time. 05/31/18 on evaluation today patient appears to actually be doing better in regard to her right lateral lower extremity ulcer specifically on the malleolus region. She has been tolerating the antibiotic without complication. With that being said she still continues to have issues but a little bit of redness although nothing like she what she was experiencing previous. She still continues to pressure to her ankle area she did get the problem on offloading boots unfortunately she will not wear them she states there too uncomfortable and she can't get in and out of the bed. Nonetheless at this point her wounds seem to be continually getting worse which is not what we want I'm getting somewhat concerned about her progress and how things are going to proceed if we do not intervene in some way shape or form. I therefore had a very lengthy conversation today about offloading yet again and even made a specific suggestion for switching her to a memory foam mattress and even gave the information for a specific one that they could look at getting if it was something that they were interested in considering. She does not want to be considered for a hospital bed air mattress although honestly insurance would not cover it that she does not have any wounds on her trunk. 06/14/18 on  evaluation today both wounds over the bilateral lateral malleolus her ulcers appear to be doing better there's no evidence of pressure injury at this point. She did get the foam mattress for her bed and this does seem to have been extremely beneficial for her in my pinion. Her daughter states that she is having difficulty getting out of bed because of how soft it is. The patient also relates this to be. Nonetheless I do feel like she's actually doing better. Unfortunately right after and around the time she was getting the mattress she also sustained a fall when she got up to go pick up the phone and ended up injuring her right elbow she has 18 sutures in place. We are not caring for this currently although home health is going to be taking the sutures out shortly. Nonetheless this may be something that we need to evaluate going forward. It depends on how well it has or has not healed in the end. She also recently saw an orthopedic specialist for an injection  in the right shoulder just before her fall unfortunately the fall seems to have worsened her pain. 06/21/18 on evaluation today patient appears to be doing about the same in regard to her lateral malleolus ulcers. Both appear to be just a little bit deeper but again we are clinging away the necrotic and dead tissue which I think is why this is progressing towards a deeper realm as opposed to improving from my measurement standpoint in that regard. Nonetheless she has been tolerating the dressing changes she absolutely hates the memory foam mattress topper that was obtained for her nonetheless I do believe this is still doing excellent as far as taking care of excess pressure in regard to the lateral malleolus regions. She in fact has no pressure injury that I see whereas in weeks past it was week by week I was constantly seeing new pressure injuries. Overall I think it has been very beneficial for her. 07/03/18; patient arrives in my clinic today.  She has deep punched out areas over her bilateral lateral malleoli. The area on the right has some more depth. [name], [name] ([id number]) We spent a lot of time today talking about pressure relief for these areas. This started when her daughter asked for a prescription for a memory foam mattress. I have never written a prescription for a mattress and I don't think insurances would pay for that on an ordinary bed. In any case he came up that she has foam boots that she refuses to wear. I would suggest going to these before any other offloading issues when she is in bed. They say she is meticulous about offloading this the rest of the day 07/10/18- She is seen in follow-up evaluation for bilateral, lateral malleolus ulcers. There is no improvement in the ulcers. She has purchased and is sleeping on a memory foam mattress/overlay, she has been using the offloading boots nightly over the past week. She has a follow up appointment with vascular medicine at the end of October, in my opinion this follow up should be expedited given her deterioration and suboptimal TBI results. We will order plain film xray of the left ankle as deeper structures are palpable; would consider having MRI, regardless of xray report(s). The ulcers will be treated with iodoflex/iodosorb, she is unable to safely change the dressings daily with santyl. 07/19/18 on evaluation today patient appears to be doing in general visually well in regard to her bilateral lateral malleolus ulcers. She has been tolerating the dressing changes without complication which is good news. With that being said we did have an x-ray performed on 07/12/18 which revealed a slight loosen see in the lateral portion of the distal left fibula which may represent artifact but underline lytic destruction or osteomyelitis could not be excluded. MRI was recommended. With that being said we can see about getting the patient scheduled for an MRI to further evaluate  this area. In fact we have that scheduled currently for August 20 19,019. 07/26/18 on evaluation today patient's wound on the right lateral ankle actually appears to be doing fairly well at this point in my pinion. She has made some good progress currently. With that being said unfortunately in regard to the left lateral ankle ulcer this seems to be a little bit more problematic at this time. In fact as I further evaluated the situation she actually had bone exposed which is the first time that's been the case in the bone appear to be necrotic. Currently I did review patient's  note from Dr. Bunnie Domino office with [facility]. He stated that ABI was 1.26 on the right and 0.95 on the left with good waveforms. Her perfusion is stable not reduced from previous studies and her digital waveforms were pretty good particularly on the right. His conclusion upon review of the note was that there was not much she could do to improve her perfusion and he felt she was adequate for wound healing. His suggestion was that she continued to see [place] and consider a synthetic skin graft if there was no underlying infection. He plans to see her back in six months or as needed. 08/01/18 on evaluation today patient appears to be doing better in regard to her right lateral ankle ulcer. Her left lateral ankle ulcer is about the same she still has bone involvement in evidence of necrosis. There does not appear to be evidence of infection at this time On the right lateral lower extremity. I have started her on the Augmentin she picked this up and started this yesterday. This is to get her through until she sees infectious disease which is scheduled for 08/12/18. 08/06/18 on evaluation today patient appears to be doing rather well considering my discussion with patient's daughter at the end of last week. The area which was marked where she had erythema seems to be improved and this is good news. With that being said  overall the patient seems to be making good improvement when it comes to the overall appearance of the right lateral ankle ulcer although this has been slow she at least is coming around in this regard. Unfortunately in regard to the left lateral ankle ulcer this is osteomyelitis based on the pathology report as well is bone culture. Nonetheless we are still waiting CT scan. Unfortunately the MRI we originally ordered cannot be performed as the patient is a pacemaker which I had overlooked. Nonetheless we are working on the CT scan approval and scheduling as of now. She did go to the hospital over the weekend and was placed on IV Cefzo for a couple of days. Fortunately this seems to have improved the erythema quite significantly which is good news. There does not appear to be any evidence of worsening infection at this time. She did have some bleeding after the last debridement therefore I did not perform any sharp debridement in regard to left lateral ankle at this point. Patient has been approved for a snap vac for the right lateral ankle. 08/14/18; the patient with wounds over her bilateral lateral malleoli. The area on the right actually looks quite good. Been using a snap back on this area. Healthy granulation and appears to be filling in. Unfortunately the area on the left is really problematic. 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 Also noted to have cellulitis. She saw Dr. Novella Olive of infectious disease today and was put on linezolid. We are able to verify this with her pharmacy. She is completed the Augmentin that she was already on. We've been using Iodoflex to this area 08/23/18 on evaluation today patient's wounds both actually appear to be doing better compared to my prior evaluations. Fortunately she showing signs of good improvement in regard to the overall wound status especially where were using  the snap vac on the right. In regard to left lateral malleolus the wound bed actually appears to be much cleaner than previously noted. I do not feel  any phone directly probed during evaluation today and though there is tendon noted this does not appear [name], [name] ([id number]) to be necrotic it's actually fairly good as far as the overall appearance of the tendon is concerned. In general the wound bed actually appears to be doing significantly better than it was previous. Patient is currently in the care of [name] and I did review that note today. He actually has her on two weeks of linezolid and then following the patient will be on 1-2 months of Keflex. That is the plan currently. She has been on antibiotics therapy as prescribed by myself initially starting on July 30, 2018 and has been on that continuously up to this point. 08/30/18 on evaluation today patient actually appears to be doing much better in regard to her right lateral malleolus ulcer. She has been tolerating the dressing changes specifically the snap vac without complication although she did have some issues with the seal currently. Apparently there was some trouble with getting it to maintain over the past week past Sunday. Nonetheless overall the wound appears better in regard to the right lateral malleolus region. In regard to left lateral malleolus this actually show some signs of additional granulation although there still tendon noted in the base of the wound this appears to be healthy not necrotic in any way whatsoever. We are considering potentially using a snap vac for the left lateral malleolus as well the product wrap from [employer], [place], was present in the clinic today we're going to see this patient I did have her come in with me after obtaining consent from the patient and her daughter in order to look at the wound and see if there's any recommendation one way or another as to whether or not they felt the  snapback could be beneficial for the left lateral malleolus region. But the conclusion was that it might be but that this is definitely a little bit deeper wound than what traditionally would be utilized for a snap vac. 09/06/18 on evaluation today patient actually appears to be doing excellent in my pinion in regard to both ankle ulcers. She has been tolerating the dressing changes without complication which is great news. Specifically we have been using the snap vac. In regard to the right ankle I'm not even sure that this is going to be necessary for today and following as the wound has filled in quite nicely. In regard to the left ankle I do believe that we're seeing excellent epithelialization from the edge as well as granulation in the central portion the tendon is still exposed but there's no evidence of necrotic bone and in general I feel like the patient has made excellent progress even compared to last week with just one week of the snap vac. 09/11/18; this is a patient who has wounds on her bilateral lateral malleoli. Initially both of these were deep stage IV wounds in the setting of chronic arterial insufficiency. She has been revascularized. As I understand think she been using snap vacs to both of these wounds however the area on the right became more superficial and currently she is only using it on the left. Using silver collagen on the right and silver collagen under the back on the left I believe 09/19/18 on evaluation today patient actually appears to be doing very well in regard to her lateral malleolus or ulcers bilaterally. She has been tolerating the dressing changes without complication. Fortunately there does not appear to be any evidence  of infection at this time. Overall I feel like she is improving in an excellent manner and I'm very pleased with the fact that everything seems to be turning towards the better for her. This has obviously been a long road. 09/27/18 on  evaluation today patient actually appears to be doing very well in regard to her bilateral lateral malleolus ulcers. She has been tolerating the dressing changes without complication. Fortunately there does not appear to be any evidence of infection at this time which is also great news. No fevers, chills, nausea, or vomiting noted at this time. Overall I feel like she is doing excellent with the snap vac on the left malleolus. She had 40 mL of fluid collection over the past week. 10/04/18 on evaluation today patient actually appears to be doing well in regard to her bilateral lateral malleolus ulcers. She continues to tolerate the dressing changes without complication. One issue that I see is the snap vac on the left lateral malleolus which appears to have sealed off some fluid underlying this area and has not really allowed it to heal to the degree that I would like to see. For that reason I did suggest at this point we may want to pack a small piece of packing strip into this region to allow it to more effectively wick out fluid. Electronic Signature(s) Signed: 10/07/2018 9:27:58 AM By: Worthy Keeler PA-C Entered By: Worthy Keeler on 10/04/2018 13:05:19 Tinnel, [name] ([id number]) -------------------------------------------------------------------------------- Physical Exam Details Patient Name: [name] Date of Service: 10/04/2018 11:00 AM Medical Record Number: [id number] Patient Account Number: [id number] Date of Birth/Sex: [birth year] (84 y.o. F) Treating RN: Montey Hora Primary Care Provider: Grayland Ormond Other Clinician: Referring Provider: Grayland Ormond Treating Provider/Extender: STONE III, Lorielle Boehning Weeks in Treatment: 25 Constitutional Well-nourished and well-hydrated in no acute distress. Respiratory normal breathing without difficulty. Psychiatric this patient is able to make decisions and demonstrates good insight into disease process. Alert and Oriented x  3. pleasant and cooperative. Notes Patient's wounds both show signs of good granulation sharp debridement was required over the left lateral malleolus today which he tolerated without complication post debridement the wound bed appears to be doing significantly better. Fortunately the patient continues to tolerate the dressing changes without complication. Electronic Signature(s) Signed: 10/07/2018 9:27:58 AM By: Worthy Keeler PA-C Entered By: Worthy Keeler on 10/04/2018 13:06:55 Po[place], [name] ([id number]) -------------------------------------------------------------------------------- Physician Orders Details Patient Name: [name] Date of Service: 10/04/2018 11:00 AM Medical Record Number: [id number] Patient Account Number: [id number] Date of Birth/Sex: [birth year] (84 y.o. F) Treating RN: Montey Hora Primary Care Provider: Grayland Ormond Other Clinician: Referring Provider: Grayland Ormond Treating Provider/Extender: Melburn Hake, Gianluca Chhim Weeks in Treatment: 40 Verbal / Phone Orders: No Diagnosis Coding Wound Cleansing Wound #1 Right,Lateral Malleolus o Clean wound with Normal Saline. o Cleanse wound with mild soap and water o May Shower, gently pat wound dry prior to applying new dressing. Wound #2 Left,Lateral Malleolus o Clean wound with Normal Saline. o Cleanse wound with mild soap and water o May Shower, gently pat wound dry prior to applying new dressing. Anesthetic (add to Medication List) Wound #1 Right,Lateral Malleolus o Topical Lidocaine 4% cream applied to wound bed prior to debridement (In Clinic Only). Wound #2 Left,Lateral Malleolus o Topical Lidocaine 4% cream applied to wound bed prior to debridement (In Clinic Only). Primary Wound Dressing Wound #1 Right,Lateral Malleolus o Silver Collagen Wound #2 Left,Lateral Malleolus o Silver Collagen - under SNAP VAC o  Plain packing gauze - under SNAP VAC Secondary Dressing Wound  #1 Right,Lateral Malleolus o Boardered Foam Dressing o Drawtex Dressing Change Frequency Wound #1 Right,Lateral Malleolus o Change Dressing Monday, Wednesday, Friday Follow-up Appointments Wound #1 Right,Lateral Malleolus o Return Appointment in 1 week. Wound #2 Left,Lateral Malleolus o Return Appointment in 1 week. BAYLYN, SICKLES ([id number]) Edema Control Wound #1 Right,Lateral Malleolus o Patient to wear own compression stockings o Patient to wear own Velcro compression garment. o Elevate legs to the level of the heart and pump ankles as often as possible Wound #2 Left,Lateral Malleolus o Patient to wear own compression stockings o Patient to wear own Velcro compression garment. o Elevate legs to the level of the heart and pump ankles as often as possible Off-Loading Wound #1 Right,Lateral Malleolus o Turn and reposition every 2 hours o Other: - do not put pressure on your ankles Wound #2 Left,Lateral Malleolus o Turn and reposition every 2 hours o Other: - do not put pressure on your ankles Additional Orders / Instructions Wound #1 Right,Lateral Malleolus o Increase protein intake. Wound #2 Left,Lateral Malleolus o Increase protein intake. Home Health Wound #1 [place] [facility] - [place] Nurse may visit PRN to address patientos wound care needs. o FACE TO FACE ENCOUNTER: MEDICARE and MEDICAID PATIENTS: I certify that this patient is under my care and that I had a face-to-face encounter that meets the physician face-to-face encounter requirements with this patient on this date. The encounter with the patient was in whole or in part for the following MEDICAL CONDITION: (primary reason for [place]) MEDICAL NECESSITY: I certify, that based on my findings, NURSING services are a medically necessary home health service. HOME BOUND STATUS: I certify that my clinical findings  support that this patient is homebound (i.e., Due to illness or injury, pt requires aid of supportive devices such as crutches, cane, wheelchairs, walkers, the use of special transportation or the assistance of another person to leave their place of residence. There is a normal inability to leave the home and doing so requires considerable and taxing effort. Other absences are for medical reasons / religious services and are infrequent or of short duration when for other reasons). o If current dressing causes regression in wound condition, may D/C ordered dressing product/s and apply Normal Saline Moist Dressing daily until next [place] / Other MD appointment. [place] of regression in wound condition at [phone]. o Please direct any NON-WOUND related issues/requests for orders to patient's Primary Care Physician Wound #2 [place] [facility] - [place] Nurse may visit PRN to address patientos wound care needs. o FACE TO FACE ENCOUNTER: MEDICARE and MEDICAID PATIENTS: I certify that this patient is under my care and that I had a face-to-face encounter that meets the physician face-to-face encounter requirements with this patient on this date. The encounter with the patient was in whole or in part for the following MEDICAL CONDITION: (primary reason for [place]) MEDICAL NECESSITY: I certify, that based on my findings, NURSING services are a medically necessary home health service. HOME BOUND STATUS: I certify that my clinical findings support that this patient is homebound (i.e., Due to illness or injury, pt requires aid of supportive devices such as crutches, cane, wheelchairs, walkers, the use of special transportation or the ZAHRIA, DING ([id number]) assistance of another person to leave their place of residence. There is a normal inability  to leave the home and doing so requires  considerable and taxing effort. Other absences are for medical reasons / religious services and are infrequent or of short duration when for other reasons). o If current dressing causes regression in wound condition, may D/C ordered dressing product/s and apply Normal Saline Moist Dressing daily until next [place] / Other MD appointment. [place] of regression in wound condition at [phone]. o Please direct any NON-WOUND related issues/requests for orders to patient's Primary Care Physician Negative Pressure Wound Therapy Wound #2 Left,Lateral Malleolus o Other: - SNAP VAC Electronic Signature(s) Signed: 10/04/2018 5:38:25 PM By: Montey Hora Signed: 10/07/2018 9:27:58 AM By: Worthy Keeler PA-C Entered By: Montey Hora on 10/04/2018 11:54:23 Ragan, [name] ([id number]) -------------------------------------------------------------------------------- Problem List Details Patient Name: [name] Date of Service: 10/04/2018 11:00 AM Medical Record Number: [id number] Patient Account Number: [id number] Date of Birth/Sex: 05/28/1923 (84 y.o. F) Treating RN: Montey Hora Primary Care Provider: Grayland Ormond Other Clinician: Referring Provider: Grayland Ormond Treating Provider/Extender: Melburn Hake, Yarden Hillis Weeks in Treatment: 60 Active Problems ICD-10 Evaluated Encounter Code Description Active Date Today Diagnosis 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, stage 4 05/10/2018 No Yes L89.524 Pressure ulcer of left ankle, stage 4 04/25/2018 No Yes I70.233 Atherosclerosis of native arteries of right leg with ulceration of 08/10/2017 No Yes ankle I70.243 Atherosclerosis of native arteries of left leg with ulceration of 07/10/2018 No Yes ankle I89.0 Lymphedema, not elsewhere classified 08/10/2017 No Yes B35.4 Tinea corporis 09/28/2017 No Yes 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 F) Treating RN: Montey Hora Primary Care Provider: Grayland Ormond Other Clinician: Referring Provider: Grayland Ormond Treating Provider/Extender: Melburn Hake, Charli Liberatore Weeks in Treatment: 60 Subjective Chief Complaint Information obtained from Patient Patient is here for right lateral malleolus and left lateral malleolus ulcer History of Present Illness (HPI) 84 year old patient who most recently has been seeing both podiatry and vascular surgery for a long-standing ulcer of her right lateral malleolus which has been treated with various methodologies. Dr. Amalia Hailey the podiatrist saw her on 07/20/2017 and sent her to the wound center for possible hyperbaric oxygen therapy. past medical history of peripheral vascular disease, varicose veins, status post appendectomy, basal cell carcinoma excision from the left leg, cholecystectomy, pacemaker placement, right lower extremity angiography done by Dr. dew in March 2017 with placement of a stent. there is also note of a successful ablation of the right small saphenous vein done which was reviewed by ultrasound on 10/24/2016. the patient had a right small saphenous vein ablation done on 10/20/2016. The patient has never been a smoker. She has been seen by Dr. Corene Cornea dew the vascular surgeon who most recently saw her on 06/15/2017 for evaluation of ongoing problems with right leg swelling. She had a lower extremity arterial duplex examination done(02/13/17) which showed patent distal right superficial femoral  artery stent and above-the-knee popliteal stent without evidence of restenosis. The ABI was more than 1.3 on the right and more than 1.3 on the left. This was consistent with noncompressible arteries due to medial calcification. The right great toe pressure and PPG waveforms are within normal limits and  the left great toe pressure and PPG waveforms are decreased. he recommended she continue to wear her compression stockings and continue with elevation. She is scheduled to have a noninvasive arterial study in the near future 08/16/2017 -- had a lower extremity arterial duplex examination done which showed patent distal right superficial femoral artery stent and above-the-knee popliteal stent without evidence of restenosis. The ABI was more than 1.3 on the right and more than 1.3 on the left. This was consistent with noncompressible arteries due to medial calcification. The right great toe pressure and PPG waveforms are within normal limits and the left great toe pressure and PPG waveforms are decreased. the x-ray of the right ankle has not yet been done 08/24/2017 -- had a right ankle x-ray -- IMPRESSION:1. No fracture, bone lesion or evidence of osteomyelitis. 2. Lateral soft tissue swelling with a soft tissue ulcer. she has not yet seen the vascular surgeon for review 08/31/17 on evaluation today patient's wound appears to be showing signs of improvement. She still with her appointment with vascular in order to review her results of her vascular study and then determine if any intervention would be recommended at that time. No fevers, chills, nausea, or vomiting noted at this time. She has been tolerating the dressing changes without complication. 09/28/17 on evaluation today patient's wound appears to show signs of good improvement in regard to the granulation tissue which is surfacing. There is still a layer of slough covering the wound and the posterior portion is still significantly deeper  than the anterior nonetheless there has been some good sign of things moving towards the better. She is going to go back to Dr. dew for reevaluation to ensure her blood flow is still appropriate. That will be before her next evaluation with [place] next week. No fevers, chills, nausea, or vomiting noted at this time. Patient does have some discomfort rated to be a 3-4/10 depending on activity specifically cleansing the wound makes it worse. 10/05/2017 -- the patient was seen by Dr. Lucky Cowboy last week and noninvasive studies showed a normal right ABI with brisk YARIELIS, FUNARO. ([id number]) triphasic waveforms consistent with no arterial insufficiency including normal digital pressures. The duplex showed a patent distal right SFA stent and the proximal SFA was also normal. He was pleased with her test and thought she should have enough of perfusion for normal wound healing. He would see her back in 6 months time. 12/21/17 on evaluation today patient appears to be doing fairly well in regard to her right lateral ankle wound. Unfortunately the main issue that she is expansion at this point is that she is having some issues with what appears to be some cellulitis in the right anterior shin. She has also been noting a little bit of uncomfortable feeling especially last night and her ankle area. I'm afraid that she made the developing a little bit of an infection. With that being said I think it is in the early stages. 12/28/17 on evaluation today patient's ankle appears to be doing excellent. She's making good progress at this point the cellulitis seems to have improved after last week's evaluation. Overall she is having no significant discomfort which is excellent news. She does have an appointment with Dr. dew on March 29, 2018 for reevaluation in regard to the stent he placed. She seems to have excellent blood flow in the right lower extremity. 01/19/12 on evaluation today patient's wound appears to be doing  very well. In fact she does not appear  to require debridement at this point, there's no evidence of infection, and overall from the standpoint of the wound she seems to be doing very well. With that being said I believe that it may be time to switch to different dressing away from the [facility] Dressing she tells me she does have a lot going on her friend actually passed away yesterday and she's also having a lot of issues with her husband this obviously is weighing heavy on her as far as your thoughts and concerns today. 01/25/18 on evaluation today patient appears to be doing fairly well in regard to her right lateral malleolus. She has been tolerating the dressing changes without complication. Overall I feel like this is definitely showing signs of improvement as far as how the overall appearance of the wound is there's also evidence of epithelium start to migrate over the granulation tissue. In general I think that she is progressing nicely as far as the wound is concerned. The only concern she really has is whether or not we can switch to every other week visits in order to avoid having as many appointments as her daughters have a difficult time getting her to her appointments as well as the patient's husband to his he is not doing very well at this point. 02/22/18 on evaluation today patient's right lateral malleolus ulcer appears to be doing great. She has been tolerating the dressing changes without complication. Overall you making excellent progress at this time. Patient is having no significant discomfort. 03/15/18 on evaluation today patient appears to be doing much more poorly in regard to her right lateral ankle ulcer at this point. Unfortunately since have last seen her her husband has passed just a few days ago is obviously weighed heavily on her her daughter also had surgery well she is with her today as usual. There does not appear to be any evidence of infection she does seem to  have significant contusion/deep tissue injury to the right lateral malleolus which was not noted previous when I saw her last. It's hard to tell of exactly when this injury occurred although during the time she was spending the night in the hospital this may have been most likely. 03/22/18 on evaluation today patient appears to actually be doing very well in regard to her ulcer. She did unfortunately have a setback which was noted last week however the good news is we seem to be getting back on track and in fact the wound in the core did still have some necrotic tissue which will be addressed at this point today but in general I'm seeing signs that things are on the up and up. She is glad to hear this obviously she's been somewhat concerned that due to the how her wound digressed more recently. 03/29/18 on evaluation today patient appears to be doing fairly well in regard to her right lower extremity lateral malleolus ulcer. She unfortunately does have a new area of pressure injury over the inferior portion where the wound has opened up a little bit larger secondary to the pressure she seems to be getting. She does tell me sometimes when she sleeps at night that it actually hurts and does seem to be pushing on the area little bit more unfortunately. There does not appear to be any evidence of infection which is good news. She has been tolerating the dressing changes without complication. She also did have some bruising in the left second and third toes due to the fact that she  may have bump this or injured it although she has neuropathy so she does not feel she did move recently that may have been where this came from. Nonetheless there does not appear to be any evidence of infection at this time. 04/12/18 on evaluation today patient's wound on the right lateral ankle actually appears to be doing a little bit better with a lot of necrotic docking tissue centrally loosening up in clearing away. However  she does have the beginnings of a deep tissue injury on the left lateral malleolus likely due to the fact we've been trying offload the right as much as we have. I think she may benefit from an assistive soft device to help with offloading and it looks like they're looking at one of the doughnut conditions that wraps around the lower leg to offload which I think will definitely do a good job. With that being said I think we definitely need to address this issue on the left before it becomes a wound. Patient is not having significant pain. [name], [name] ([id number]) 04/19/18 on evaluation today patient appears to be doing excellent in regard to the progress she's made with her right lateral ankle ulcer. The left ankle region which did show evidence of a deep tissue injury seems to be resolving there's little fluid noted underneath and a blister there's nothing open at this point in time overall I feel like this is progressing nicely which is good news. She does not seem to be having significant discomfort at this point which is also good news. 04/25/18-She is here in follow up evaluation for bilateral lateral malleolar ulcers. The right lateral malleolus ulcer with pale subcutaneous tissue exposure, central area of ulcer with tendon/periosteum exposed. The left lateral malleolus ulcer now with central area of nonviable tissue, otherwise deep tissue injury. She is wearing compression wraps to the left lower extremity, she will place the right lower extremity compression wraps on when she gets home. She will be out of town over the weekend and return next week and follow-up appointment. She completed her doxycycline this morning 05/03/18 on evaluation today patient appears to be doing very well in regard to her right lateral ankle ulcer in general. At least she's showing some signs of improvement in this regard. Unfortunately she has some additional injury to the left lateral malleolus region which  appears to be new likely even over the past several days. Again this determination is based on the overall appearance. With that being said the patient is obviously frustrated about this currently. 05/10/18-She is here in follow-up evaluation for bilateral lateral malleolar ulcers. She states she has purchased offloading shoes/boots and they will arrive tomorrow. She was asked to bring them in the office at next week's appointment so her provider is aware of product being utilized. She continues to sleep on right or left side, she has been encouraged to sleep on her back. The right lateral malleolus ulcer is precariously close to peri-osteum; will order xray. The left lateral malleolus ulcer is improved. Will switch back to santyl; she will follow up next week. 05/17/18 on evaluation today patient actually appears to be doing very well in regard to her malleolus her ulcers compared to last time I saw them. She does not seem to have as much in the way of contusion at this point which is great news. With that being said she does continue to have discomfort and I do believe that she is still continuing to benefit from the offloading/pressure  reducing boots that were recommended. I think this is the key to trying to get this to heal up completely. 05/24/18 on evaluation today patient actually appears to be doing worse at this point in time unfortunately compared to her last week's evaluation. She is having really no increased pain which is good news unfortunately she does have more maceration in your theme and noted surrounding the right lateral ankle the left lateral ankle is not really is erythematous I do not see signs of the overt cellulitis on that side. Unfortunately the wounds do not seem to have shown any signs of improvement since the last evaluation. She also has significant swelling especially on the right compared to previous some of this may be due to infection however also think that she may be  served better while she has these wounds by compression wrapping versus continuing to use the Juxta-Lite for the time being. Especially with the amount of drainage that she is experiencing at this point. No fevers, chills, nausea, or vomiting noted at this time. 05/31/18 on evaluation today patient appears to actually be doing better in regard to her right lateral lower extremity ulcer specifically on the malleolus region. She has been tolerating the antibiotic without complication. With that being said she still continues to have issues but a little bit of redness although nothing like she what she was experiencing previous. She still continues to pressure to her ankle area she did get the problem on offloading boots unfortunately she will not wear them she states there too uncomfortable and she can't get in and out of the bed. Nonetheless at this point her wounds seem to be continually getting worse which is not what we want I'm getting somewhat concerned about her progress and how things are going to proceed if we do not intervene in some way shape or form. I therefore had a very lengthy conversation today about offloading yet again and even made a specific suggestion for switching her to a memory foam mattress and even gave the information for a specific one that they could look at getting if it was something that they were interested in considering. She does not want to be considered for a hospital bed air mattress although honestly insurance would not cover it that she does not have any wounds on her trunk. 06/14/18 on evaluation today both wounds over the bilateral lateral malleolus her ulcers appear to be doing better there's no evidence of pressure injury at this point. She did get the foam mattress for her bed and this does seem to have been extremely beneficial for her in my pinion. Her daughter states that she is having difficulty getting out of bed because of how soft it is. The patient  also relates this to be. Nonetheless I do feel like she's actually doing better. Unfortunately right after and around the time she was getting the mattress she also sustained a fall when she got up to go pick up the phone and ended up injuring her right elbow she has [address] sut[address] in place. We are not caring for this currently although home health is going to be taking the sutures out shortly. Nonetheless this may be something that we need to evaluate going forward. It depends on how well it has or has not healed in the end. She also recently saw an orthopedic specialist for an injection in the right shoulder just before her fall unfortunately the fall seems to have worsened her pain. 06/21/18 on evaluation today  patient appears to be doing about the same in regard to her lateral malleolus ulcers. Both appear to be just a little bit deeper but again we are clinging away the necrotic and dead tissue which I think is why this is progressing towards a deeper realm as opposed to improving from my measurement standpoint in that regard. Nonetheless she has been tolerating the dressing changes she absolutely hates the memory foam mattress topper that was obtained for her nonetheless [name], [name] [name] I do believe this is still doing excellent as far as taking care of excess pressure in regard to the lateral malleolus regions. She in fact has no pressure injury that I see whereas in weeks past it was week by week I was constantly seeing new pressure injuries. Overall I think it has been very beneficial for her. 07/03/18; patient arrives in my clinic today. She has deep punched out areas over her bilateral lateral malleoli. The area on the right has some more depth. We spent a lot of time today talking about pressure relief for these areas. This started when her daughter asked for a prescription for a memory foam mattress. I have never written a prescription for a mattress and I don't think  insurances would pay for that on an ordinary bed. In any case he came up that she has foam boots that she refuses to wear. I would suggest going to these before any other offloading issues when she is in bed. They say she is meticulous about offloading this the rest of the day 07/10/18- She is seen in follow-up evaluation for bilateral, lateral malleolus ulcers. There is no improvement in the ulcers. She has purchased and is sleeping on a memory foam mattress/overlay, she has been using the offloading boots nightly over the past week. She has a follow up appointment with vascular medicine at the end of October, in my opinion this follow up should be expedited given her deterioration and suboptimal TBI results. We will order plain film xray of the left ankle as deeper structures are palpable; would consider having MRI, regardless of xray report(s). The ulcers will be treated with iodoflex/iodosorb, she is unable to safely change the dressings daily with santyl. 07/19/18 on evaluation today patient appears to be doing in general visually well in regard to her bilateral lateral malleolus ulcers. She has been tolerating the dressing changes without complication which is good news. With that being said we did have an x-ray performed on 07/12/18 which revealed a slight loosen see in the lateral portion of the distal left fibula which may represent artifact but underline lytic destruction or osteomyelitis could not be excluded. MRI was recommended. With that being said we can see about getting the patient scheduled for an MRI to further evaluate this area. In fact we have that scheduled currently for August 20 19,019. 07/26/18 on evaluation today patient's wound on the right lateral ankle actually appears to be doing fairly well at this point in my pinion. She has made some good progress currently. With that being said unfortunately in regard to the left lateral ankle ulcer this seems to be a little bit more  problematic at this time. In fact as I further evaluated the situation she actually had bone exposed which is the first time that's been the case in the bone appear to be necrotic. Currently I did review patient's note from Dr. Bunnie Domino office with [facility]. He stated that ABI was 1.26 on the right and  0.95 on the left with good waveforms. Her perfusion is stable not reduced from previous studies and her digital waveforms were pretty good particularly on the right. His conclusion upon review of the note was that there was not much she could do to improve her perfusion and he felt she was adequate for wound healing. His suggestion was that she continued to see [place] and consider a synthetic skin graft if there was no underlying infection. He plans to see her back in six months or as needed. 08/01/18 on evaluation today patient appears to be doing better in regard to her right lateral ankle ulcer. Her left lateral ankle ulcer is about the same she still has bone involvement in evidence of necrosis. There does not appear to be evidence of infection at this time On the right lateral lower extremity. I have started her on the Augmentin she picked this up and started this yesterday. This is to get her through until she sees infectious disease which is scheduled for 08/12/18. 08/06/18 on evaluation today patient appears to be doing rather well considering my discussion with patient's daughter at the end of last week. The area which was marked where she had erythema seems to be improved and this is good news. With that being said overall the patient seems to be making good improvement when it comes to the overall appearance of the right lateral ankle ulcer although this has been slow she at least is coming around in this regard. Unfortunately in regard to the left lateral ankle ulcer this is osteomyelitis based on the pathology report as well is bone culture. Nonetheless we are still waiting  CT scan. Unfortunately the MRI we originally ordered cannot be performed as the patient is a pacemaker which I had overlooked. Nonetheless we are working on the CT scan approval and scheduling as of now. She did go to the hospital over the weekend and was placed on IV Cefzo for a couple of days. Fortunately this seems to have improved the erythema quite significantly which is good news. There does not appear to be any evidence of worsening infection at this time. She did have some bleeding after the last debridement therefore I did not perform any sharp debridement in regard to left lateral ankle at this point. Patient has been approved for a snap vac for the right lateral ankle. 08/14/18; the patient with wounds over her bilateral lateral malleoli. The area on the right actually looks quite good. Been using a snap back on this area. Healthy granulation and appears to be filling in. Unfortunately the area on the left is really problematic. 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 Also noted to have cellulitis. She saw Dr. Novella Olive of infectious disease today and was put on linezolid. We are able to verify this with her pharmacy. She is completed the Augmentin that she was already on. We've been using Iodoflex to this [name], [name] ([id number]) area 08/23/18 on evaluation today patient's wounds both actually appear to be doing better compared to my prior evaluations. Fortunately she showing signs of good improvement in regard to the overall wound status especially where were using the snap vac on the right. In regard to left lateral malleolus the wound bed actually appears to be much cleaner than previously noted. I do not feel any phone directly probed during evaluation today and though there is tendon noted this does not appear to  be necrotic it's actually fairly good as far as the overall appearance of the tendon  is concerned. In general the wound bed actually appears to be doing significantly better than it was previous. Patient is currently in the care of [name] and I did review that note today. He actually has her on two weeks of linezolid and then following the patient will be on 1-2 months of Keflex. That is the plan currently. She has been on antibiotics therapy as prescribed by myself initially starting on July 30, 2018 and has been on that continuously up to this point. 08/30/18 on evaluation today patient actually appears to be doing much better in regard to her right lateral malleolus ulcer. She has been tolerating the dressing changes specifically the snap vac without complication although she did have some issues with the seal currently. Apparently there was some trouble with getting it to maintain over the past week past Sunday. Nonetheless overall the wound appears better in regard to the right lateral malleolus region. In regard to left lateral malleolus this actually show some signs of additional granulation although there still tendon noted in the base of the wound this appears to be healthy not necrotic in any way whatsoever. We are considering potentially using a snap vac for the left lateral malleolus as well the product wrap from [employer], [place], was present in the clinic today we're going to see this patient I did have her come in with me after obtaining consent from the patient and her daughter in order to look at the wound and see if there's any recommendation one way or another as to whether or not they felt the snapback could be beneficial for the left lateral malleolus region. But the conclusion was that it might be but that this is definitely a little bit deeper wound than what traditionally would be utilized for a snap vac. 09/06/18 on evaluation today patient actually appears to be doing excellent in my pinion in regard to both ankle ulcers. She has been tolerating the dressing  changes without complication which is great news. Specifically we have been using the snap vac. In regard to the right ankle I'm not even sure that this is going to be necessary for today and following as the wound has filled in quite nicely. In regard to the left ankle I do believe that we're seeing excellent epithelialization from the edge as well as granulation in the central portion the tendon is still exposed but there's no evidence of necrotic bone and in general I feel like the patient has made excellent progress even compared to last week with just one week of the snap vac. 09/11/18; this is a patient who has wounds on her bilateral lateral malleoli. Initially both of these were deep stage IV wounds in the setting of chronic arterial insufficiency. She has been revascularized. As I understand think she been using snap vacs to both of these wounds however the area on the right became more superficial and currently she is only using it on the left. Using silver collagen on the right and silver collagen under the back on the left I believe 09/19/18 on evaluation today patient actually appears to be doing very well in regard to her lateral malleolus or ulcers bilaterally. She has been tolerating the dressing changes without complication. Fortunately there does not appear to be any evidence of infection at this time. Overall I feel like she is improving in an excellent manner and I'm very pleased with  the fact that everything seems to be turning towards the better for her. This has obviously been a long road. 09/27/18 on evaluation today patient actually appears to be doing very well in regard to her bilateral lateral malleolus ulcers. She has been tolerating the dressing changes without complication. Fortunately there does not appear to be any evidence of infection at this time which is also great news. No fevers, chills, nausea, or vomiting noted at this time. Overall I feel like she is doing  excellent with the snap vac on the left malleolus. She had 40 mL of fluid collection over the past week. 10/04/18 on evaluation today patient actually appears to be doing well in regard to her bilateral lateral malleolus ulcers. She continues to tolerate the dressing changes without complication. One issue that I see is the snap vac on the left lateral malleolus which appears to have sealed off some fluid underlying this area and has not really allowed it to heal to the degree that I would like to see. For that reason I did suggest at this point we may want to pack a small piece of packing strip into this region to allow it to more effectively wick out fluid. Patient History Information obtained from Patient. Family History Cancer - Father,Siblings, Heart Disease - Siblings, No family history of Diabetes, Hereditary Spherocytosis, Hypertension, Kidney Disease, Lung Disease, Seizures, Stroke, Connell, [name] ([id number]) Thyroid Problems, Tuberculosis. Social History Never smoker, Marital Status - Married, Alcohol Use - Never, Drug Use - No History, Caffeine Use - Rarely. Review of Systems (ROS) Constitutional Symptoms (General Health) Denies complaints or symptoms of Fever, Chills. Respiratory The patient has no complaints or symptoms. Cardiovascular Complains or has symptoms of LE edema. Psychiatric The patient has no complaints or symptoms. Objective Constitutional Well-nourished and well-hydrated in no acute distress. Vitals Time Taken: 11:02 AM, Height: 65 in, Weight: 154.3 lbs, BMI: 25.7, Temperature: 98.4 F, Pulse: 85 bpm, Respiratory Rate: 16 breaths/min, Blood Pressure: 135/51 mmHg. Respiratory normal breathing without difficulty. Psychiatric this patient is able to make decisions and demonstrates good insight into disease process. Alert and Oriented x 3. pleasant and cooperative. General Notes: Patient's wounds both show signs of good granulation sharp debridement was  required over the left lateral malleolus today which he tolerated without complication post debridement the wound bed appears to be doing significantly better. Fortunately the patient continues to tolerate the dressing changes without complication. Integumentary (Hair, Skin) Wound #1 status is Open. Original cause of wound was Gradually Appeared. The wound is located on the Right,Lateral Malleolus. The wound measures 0.6cm length x 0.5cm width x 0.2cm depth; 0.236cm^2 area and 0.047cm^3 volume. There is Fat Layer (Subcutaneous Tissue) Exposed exposed. There is no tunneling or undermining noted. There is a small amount of serous drainage noted. The wound margin is distinct with the outline attached to the wound base. There is small (1-33%) pink granulation within the wound bed. There is a medium (34-66%) amount of necrotic tissue within the wound bed including Adherent Slough. The periwound skin appearance exhibited: Rubor. The periwound skin appearance did not exhibit: Callus, Crepitus, Excoriation, Induration, Rash, Scarring, Dry/Scaly, Maceration, Atrophie Blanche, Cyanosis, Ecchymosis, Hemosiderin Staining, Mottled, Pallor, Erythema. Periwound temperature was noted as No Abnormality. The periwound has tenderness on palpation. Wound #2 status is Open. Original cause of wound was Gradually Appeared. The wound is located on the Left,Lateral Malleolus. The wound measures 1.3cm length x 1cm width x 0.4cm depth; 1.021cm^2 area and 0.408cm^3 volume. There is Fat  Layer (Subcutaneous Tissue) Exposed exposed. There is no tunneling or undermining noted. There is a medium amount of serosanguineous drainage noted. The wound margin is epibole. There is medium (34-66%) pink, pale granulation within [name], [name]. ([id number]) the wound bed. There is a medium (34-66%) amount of necrotic tissue within the wound bed including Adherent Slough. The periwound skin appearance exhibited: Maceration. The periwound  skin appearance did not exhibit: Callus, Crepitus, Excoriation, Induration, Rash, Scarring, Dry/Scaly, Atrophie Blanche, Cyanosis, Ecchymosis, Hemosiderin Staining, Mottled, Pallor, Rubor, Erythema. Periwound temperature was noted as No Abnormality. The periwound has tenderness on palpation. 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 There was a Excisional Skin/Subcutaneous Tissue Debridement with a total area of 1.3 sq cm performed by STONE III, Floella Ensz E., PA-C. With the following instrument(s): Curette to remove Viable and Non-Viable tissue/material. Material removed includes Subcutaneous Tissue and Slough and after achieving pain control using Lidocaine 4% Topical Solution. No specimens were taken. A time out was conducted at 11:50, prior to the start of the procedure. A Minimum amount of bleeding was controlled with Pressure. The procedure was tolerated well with a pain level of 0 throughout and a pain level of 0 following the procedure. Post Debridement Measurements: 1.3cm length x 1cm width x 0.6cm depth; 0.613cm^3 volume. Post debridement Stage noted as Category/Stage IV. Character of Wound/Ulcer Post Debridement is improved. Severity of Tissue Post Debridement is: Fat layer exposed. Post procedure Diagnosis Wound #2: Same as Pre-Procedure Plan Wound Cleansing: Wound #1 Right,Lateral Malleolus: Clean wound with Normal Saline. Cleanse wound with mild soap and water May Shower, gently pat wound dry  prior to applying new dressing. Wound #2 Left,Lateral Malleolus: Clean wound with Normal Saline. Cleanse wound with mild soap and water May Shower, gently pat wound dry prior to applying new dressing. Anesthetic (add to Medication List): Wound #1 Right,Lateral Malleolus: DANA, DEBO ([id number]) Topical Lidocaine 4% cream applied to wound bed prior to debridement (In Clinic Only). Wound #2 Left,Lateral Malleolus: Topical Lidocaine 4% cream applied to wound bed prior to debridement (In Clinic Only). Primary Wound Dressing: Wound #1 Right,Lateral Malleolus: Silver Collagen Wound #2 Left,Lateral Malleolus: Silver Collagen - under SNAP VAC Plain packing gauze - under SNAP VAC Secondary Dressing: Wound #1 Right,Lateral Malleolus: Boardered Foam Dressing Drawtex Dressing Change Frequency: Wound #1 Right,Lateral Malleolus: Change Dressing Monday, Wednesday, Friday Follow-up Appointments: Wound #1 Right,Lateral Malleolus: Return Appointment in 1 week. Wound #2 Left,Lateral Malleolus: Return Appointment in 1 week. Edema Control: Wound #1 Right,Lateral Malleolus: Patient to wear own compression stockings Patient to wear own Velcro compression garment. Elevate legs to the level of the heart and pump ankles as often as possible Wound #2 Left,Lateral Malleolus: Patient to wear own compression stockings Patient to wear own Velcro compression garment. Elevate legs to the level of the heart and pump ankles as often as possible Off-Loading: Wound #1 Right,Lateral Malleolus: Turn and reposition every 2 hours Other: - do not put pressure on your ankles Wound #2 Left,Lateral Malleolus: Turn and reposition every 2 hours Other: - do not put pressure on your ankles Additional Orders /  Instructions: Wound #1 Right,Lateral Malleolus: Increase protein intake. Wound #2 Left,Lateral Malleolus: Increase protein intake. Home Health: Wound #1 Right,Lateral Malleolus: [place]  Visits - [place] Nurse may visit PRN to address patient s wound care needs. FACE TO FACE ENCOUNTER: MEDICARE and MEDICAID PATIENTS: I certify that this patient is under my care and that I had a face-to-face encounter that meets the physician face-to-face encounter requirements with this patient on this date. The encounter with the patient was in whole or in part for the following MEDICAL CONDITION: (primary reason for [place]) MEDICAL NECESSITY: I certify, that based on my findings, NURSING services are a medically necessary home health service. HOME BOUND STATUS: I certify that my clinical findings support that this patient is homebound (i.e., Due to illness or injury, pt requires aid of supportive devices such as crutches, cane, wheelchairs, walkers, the use of special transportation or the assistance of another person to leave their place of residence. There is a normal inability to leave the home and doing so requires considerable and taxing effort. Other absences are for medical reasons / religious services and are infrequent or of short duration when for other reasons). If current dressing causes regression in wound condition, may D/C ordered dressing product/s and apply Normal Saline Moist Dressing daily until next [place] / Other MD appointment. [place] of regression in wound condition at [phone]. Please direct any NON-WOUND related issues/requests for orders to patient's Primary Care Physician Wound #2 Left,Lateral Malleolus: [name], [name] ([id number]) [place] Visits - [place] Nurse may visit PRN to address patient s wound care needs. FACE TO FACE ENCOUNTER: MEDICARE and MEDICAID PATIENTS: I certify that this patient is under my care and that I had a face-to-face encounter that meets the physician face-to-face encounter requirements with this patient on this date. The encounter with the patient was  in whole or in part for the following MEDICAL CONDITION: (primary reason for [place]) MEDICAL NECESSITY: I certify, that based on my findings, NURSING services are a medically necessary home health service. HOME BOUND STATUS: I certify that my clinical findings support that this patient is homebound (i.e., Due to illness or injury, pt requires aid of supportive devices such as crutches, cane, wheelchairs, walkers, the use of special transportation or the assistance of another person to leave their place of residence. There is a normal inability to leave the home and doing so requires considerable and taxing effort. Other absences are for medical reasons / religious services and are infrequent or of short duration when for other reasons). If current dressing causes regression in wound condition, may D/C ordered dressing product/s and apply Normal Saline Moist Dressing daily until next [place] / Other MD appointment. [place] of regression in wound condition at [phone]. Please direct any NON-WOUND related issues/requests for orders to patient's Primary Care Physician Negative Pressure Wound Therapy: Wound #2 Left,Lateral Malleolus: Other: - SNAP VAC I am going to suggest at this point that we continue with the above wound care measures with the added construction to use a small piece of packing strip in the central portion of the wound that goes deeper in regard to left lateral malleolus. The patient is in agreement with this plan. We will subsequently see were things stand at follow-up. Please see above for specific wound care orders. We will see patient for re-evaluation in 1 week(s) here in the clinic. If anything worsens or  changes patient will contact our office for additional recommendations. Electronic Signature(s) Signed: 10/07/2018 9:27:58 AM By: Worthy Keeler PA-C Entered By: Worthy Keeler on 10/04/2018 13:07:29 [name]  ([id number]) -------------------------------------------------------------------------------- ROS/PFSH Details Patient Name: [name] Date of Service: 10/04/2018 11:00 AM Medical Record Number: [id number] Patient Account Number: [id number] Date of Birth/Sex: 1923/03/20 (84 y.o. F) Treating RN: Montey Hora Primary Care Provider: Grayland Ormond Other Clinician: Referring Provider: Grayland Ormond Treating Provider/Extender: Melburn Hake, Hermine Feria Weeks in Treatment: 60 Information Obtained From Patient Wound History Do you currently have one or more open woundso Yes How many open wounds do you currently haveo 1 Approximately how long have you had your woundso 2 yrs How have you been treating your wound(s) until nowo mupirocin, soaking in epsom salt Has your wound(s) ever healed and then re-openedo No Have you had any lab work done in the past montho No Have you tested positive for an antibiotic resistant organism (MRSA, VRE)o No Have you tested positive for osteomyelitis (bone infection)o No Have you had any tests for circulation on your legso Yes Who ordered the testo Dr. Lucky Cowboy [facility] test [facility] Constitutional Symptoms (General Health) Complaints and Symptoms: Negative for: Fever; Chills Cardiovascular Complaints and Symptoms: Positive for: LE edema Medical History: Positive for: Congestive Heart Failure; Hypertension Eyes Medical History: Positive for: Cataracts - surgery Respiratory Complaints and Symptoms: No Complaints or Symptoms Musculoskeletal Medical History: Positive for: Osteoarthritis Neurologic Medical History: Positive for: Neuropathy Oncologic GAVINA, DILDINE ([id number]) Medical History: Negative for: Received Chemotherapy; Received Radiation Psychiatric Complaints and Symptoms: No Complaints or Symptoms HBO Extended History Items Eyes: Cataracts Immunizations Pneumococcal Vaccine: Received Pneumococcal Vaccination:  Yes Implantable Devices Family and Social History Cancer: Yes - Father,Siblings; Diabetes: No; Heart Disease: Yes - Siblings; Hereditary Spherocytosis: No; Hypertension: No; Kidney Disease: No; Lung Disease: No; Seizures: No; Stroke: No; Thyroid Problems: No; Tuberculosis: No; Never smoker; Marital Status - Married; Alcohol Use: Never; Drug Use: No History; Caffeine Use: Rarely; Financial Concerns: No; Food, Clothing or Shelter Needs: No; Support System Lacking: No; Transportation Concerns: No; Advanced Directives: No; Patient does not want information on Advanced Directives; Do not resuscitate: No; Living Will: Yes (Not Provided); Medical Power of Attorney: No Physician Affirmation I have reviewed and agree with the above information. Electronic Signature(s) Signed: 10/04/2018 5:38:25 PM By: Montey Hora Signed: 10/07/2018 9:27:58 AM By: Worthy Keeler PA-C Entered By: Worthy Keeler on 10/04/2018 13:05:41 [place], [name] ([id number]) -------------------------------------------------------------------------------- SuperBill Details Patient Name: [name] Date of Service: 10/04/2018 Medical Record Number: [id number] Patient Account Number: [id number] Date of Birth/Sex: 11-13-1923 (84 y.o. 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

## 2018-10-11 ENCOUNTER — Encounter: Payer: Medicare Other | Admitting: Physician Assistant

## 2018-10-11 DIAGNOSIS — I70243 Atherosclerosis of native arteries of left leg with ulceration of ankle: Secondary | ICD-10-CM | POA: Diagnosis not present

## 2018-10-14 NOTE — Progress Notes (Signed)
[name], [name] ([id number]) Visit Report for 10/11/2018 Arrival Information Details Patient Name: [name] Date of Service: 10/11/2018 11:00 AM Medical Record Number: [id number] Patient Account Number: [id number] Date of Birth/Sex: 1923/11/28 (84 y.o. F) Treating RN: Montey Hora Primary Care Liliah Dorian: Grayland Ormond Other Clinician: Referring Countess Biebel: Grayland Ormond Treating Belvia Gotschall/Extender: Melburn Hake, HOYT Weeks in Treatment: 20 Visit Information History Since Last Visit Added or deleted any medications: No Patient Arrived: Walker Any new allergies or adverse reactions: No Arrival Time: 10:57 Had a fall or experienced change in No Accompanied By: daughter activities of daily living that may affect Transfer Assistance: None risk of falls: Patient Identification Verified: Yes Signs or symptoms of abuse/neglect since last visito No Secondary Verification Process Yes Hospitalized since last visit: No Completed: Implantable device outside of the clinic excluding No Patient Requires Transmission-Based No cellular tissue based products placed in the center Precautions: since last visit: Patient Has Alerts: Yes Has Dressing in Place as Prescribed: Yes Patient Alerts: ABI AVVS 03/29/18 L Pain Present Now: No 1.05 R 1.03, TBI L .56, R .85 ABI AVVS 07/23/18 L .95 R 1.26 Electronic Signature(s) Signed: 10/11/2018 4:11:24 PM By: Lorine Bears RCP, RRT, CHT Entered By: Lorine Bears on 10/11/2018 11:00:59 Tramell, [name] ([id number]) -------------------------------------------------------------------------------- Encounter Discharge Information Details Patient Name: [name] Date of Service: 10/11/2018 11:00 AM Medical Record Number: [id number] Patient Account Number: [id number] Date of Birth/Sex: 1923-02-18 (84 y.o. F) Treating RN: Montey Hora Primary Care Sully Manzi: Grayland Ormond Other Clinician: Referring  Logan Jon: Grayland Ormond Treating Mattthew Ziomek/Extender: Melburn Hake, HOYT Weeks in Treatment: 9 Encounter Discharge Information Items Post Procedure Vitals Discharge Condition: Stable Temperature (F): 98.3 Ambulatory Status: Walker Pulse (bpm): 75 Discharge Destination: Home Respiratory Rate (breaths/min): 16 Transportation: Private Auto Blood Pressure (mmHg): 111/50 Accompanied By: daughter Schedule Follow-up Appointment: Yes Clinical Summary of Care: Electronic Signature(s) Signed: 10/11/2018 5:34:16 PM By: Montey Hora Entered By: Montey Hora on 10/11/2018 11:46:15 Nomura, [name] ([id number]) -------------------------------------------------------------------------------- Lower Extremity Assessment Details Patient Name: [name] Date of Service: 10/11/2018 11:00 AM Medical Record Number: [id number] Patient Account Number: [id number] Date of Birth/Sex: 11-Nov-1923 (84 y.o. F) Treating RN: Cornell Barman Primary Care Tyrell Brereton: Grayland Ormond Other Clinician: Referring Zymir Napoli: Grayland Ormond Treating Jonessa Triplett/Extender: Melburn Hake, HOYT Weeks in Treatment: 61 Edema Assessment Assessed: [Left: No] [Right: No] [Left: Edema] [Right: :] Calf Left: Right: Point of Measurement: 29 cm From Medial Instep 31 cm 29.5 cm Ankle Left: Right: Point of Measurement: 10 cm From Medial Instep 22 cm 22.5 cm Vascular Assessment Pulses: Dorsalis Pedis Palpable: [Left:Yes] [Right:Yes] Posterior Tibial Extremity colors, hair growth, and conditions: Extremity Color: [Left:Hyperpigmented] [Right:Hyperpigmented] Hair Growth on Extremity: [Left:Yes] [Right:Yes] Temperature of Extremity: [Left:Warm] [Right:Warm] Capillary Refill: [Left:< 3 seconds] [Right:< 3 seconds] Toe Nail Assessment Left: Right: Thick: Yes Yes Discolored: No No Deformed: No No Improper Length and Hygiene: No No Electronic Signature(s) Signed: 10/11/2018 5:51:21 PM By: Gretta Cool, BSN, RN, CWS, Kim RN,  BSN Entered By: Gretta Cool, BSN, RN, CWS, Kim on 10/11/2018 11:18:39 Safranek, [name] ([id number]) -------------------------------------------------------------------------------- Multi Wound Chart Details Patient Name: [name] Date of Service: 10/11/2018 11:00 AM Medical Record Number: [id number] Patient Account Number: [id number] Date of Birth/Sex: [birth year] (84 y.o. F) Treating RN: Montey Hora Primary Care Sarika Baldini: Grayland Ormond Other Clinician: Referring Zamaria Brazzle: Grayland Ormond Treating Carollyn Etcheverry/Extender: STONE III, HOYT Weeks in Treatment: 61 Vital Signs Height(in): 65 Pulse(bpm): 75 Weight(lbs): 154.3 Blood Pressure(mmHg): 111/50 Body Mass Index(BMI): 26 Temperature(F): 98.3 Respiratory Rate 16 (breaths/min): Photos: [1:No  Photos] [2:No Photos] [3:No Photos] Wound Location: [1:Right Malleolus - Lateral] [2:Left Malleolus - Lateral] [3:Left Calcaneus] Wounding Event: [1:Gradually Appeared] [2:Gradually Appeared] [3:Gradually Appeared] Primary Etiology: [1:Pressure Ulcer] [2:Pressure Ulcer] [3:Pressure Ulcer] Secondary Etiology: [1:Arterial Insufficiency Ulcer] [2:Arterial Insufficiency Ulcer] [3:N/A] Comorbid History: [1:Cataracts, Congestive Heart Failure, Hypertension, Osteoarthritis, Neuropathy] [2:Cataracts, Congestive Heart Failure, Hypertension, Osteoarthritis, Neuropathy] [3:Cataracts, Congestive Heart Failure, Hypertension, Osteoarthritis,  Neuropathy] Date Acquired: [1:08/11/2015] [2:04/12/2018] [3:10/04/2018] Weeks of Treatment: [1:61] [2:25] [3:0] Wound Status: [1:Open] [2:Open] [3:Open] Measurements L x W x D [1:0.6x0.4x0.2] [2:1.4x1x0.6] [3:1.4x2.5x0.2] (cm) Area (cm) : [1:0.188] [2:1.1] [3:2.749] Volume (cm) : [1:0.038] [2:0.66] [3:0.55] % Reduction in Area: [1:87.70%] [2:-40.10%] [3:N/A] % Reduction in Volume: [1:91.70%] [2:-735.40%] [3:N/A] Starting Position 1 [2:4] (o'clock): Ending Position 1 [2:8] (o'clock): Maximum Distance 1 (cm):  [2:0.2] Undermining: [1:No] [2:Yes] [3:No] Classification: [1:Category/Stage IV] [2:Category/Stage IV] [3:Category/Stage II] Exudate Amount: [1:Medium] [2:Medium] [3:Large] Exudate Type: [1:Serous] [2:Serosanguineous] [3:Serous] Exudate Color: [1:amber] [2:red, brown] [3:amber] Wound Margin: [1:Distinct, outline attached] [2:Epibole] [3:Indistinct, nonvisible] Granulation Amount: [1:Small (1-33%)] [2:Large (67-100%)] [3:Large (67-100%)] Granulation Quality: [1:Pink] [2:Pink, Pale] [3:Red] Necrotic Amount: [1:Medium (34-66%)] [2:Small (1-33%)] [3:Small (1-33%)] Necrotic Tissue: [1:Adherent Slough] [2:Adherent Slough] [3:Eschar] Exposed Structures: [1:Fat Layer (Subcutaneous Tissue) Exposed: Yes Fascia: No Tendon: No Muscle: No] [2:Fat Layer (Subcutaneous Tissue) Exposed: Yes Fascia: No Tendon: No Muscle: No] [3:Fat Layer (Subcutaneous Tissue) Exposed: Yes Fascia: No Tendon: No Muscle: No] Joint: No Joint: No Joint: No Bone: No Bone: No Bone: No Epithelialization: Medium (34-66%) None None Periwound Skin Texture: Excoriation: No Excoriation: No Excoriation: No Induration: No Induration: No Induration: No Callus: No Callus: No Callus: No Crepitus: No Crepitus: No Crepitus: No Rash: No Rash: No Rash: No Scarring: No Scarring: No Scarring: No Periwound Skin Moisture: Maceration: No Maceration: Yes Maceration: Yes Dry/Scaly: No Dry/Scaly: No Dry/Scaly: No Periwound Skin Color: Rubor: Yes Atrophie Blanche: No Atrophie Blanche: No Atrophie Blanche: No Cyanosis: No Cyanosis: No Cyanosis: No Ecchymosis: No Ecchymosis: No Ecchymosis: No Erythema: No Erythema: No Erythema: No Hemosiderin Staining: No Hemosiderin Staining: No Hemosiderin Staining: No Mottled: No Mottled: No Mottled: No Pallor: No Pallor: No Pallor: No Rubor: No Rubor: No Temperature: No Abnormality No Abnormality N/A Tenderness on Palpation: Yes Yes No Wound Preparation: Ulcer  Cleansing: Ulcer Cleansing: Ulcer Cleansing: Rinsed/Irrigated with Saline Rinsed/Irrigated with Saline Rinsed/Irrigated with Saline Topical Anesthetic Applied: Topical Anesthetic Applied: Topical Anesthetic Applied: Other: lidocaine 4% Other: lidocaine 4% Other: lidocaine 4% Treatment Notes Electronic Signature(s) Signed: 10/11/2018 5:34:16 PM By: Montey Hora Entered By: Montey Hora on 10/11/2018 11:26:41 W[place]ole, [name] ([id number]) -------------------------------------------------------------------------------- [place] Details Patient Name: [name] Date of Service: 10/11/2018 11:00 AM Medical Record Number: [id number] Patient Account Number: [id number] Date of Birth/Sex: Dec 21, 1922 (84 y.o. F) Treating RN: Montey Hora Primary Care Katherinne Mofield: Grayland Ormond Other Clinician: Referring Governor Matos: Grayland Ormond Treating Marlise Fahr/Extender: Melburn Hake, HOYT Weeks in Treatment: 43 Active Inactive ` Abuse / Safety / Falls / Self Care Management Nursing Diagnoses: Potential for falls Goals: Patient will not experience any injury related to falls Date Initiated: 08/10/2017 Target Resolution Date: 11/10/2017 Goal Status: Active Interventions: Assess Activities of Daily Living upon admission and as needed Assess fall risk on admission and as needed Assess: immobility, friction, shearing, incontinence upon admission and as needed Notes: ` Nutrition Nursing Diagnoses: Imbalanced nutrition Potential for alteratiion in Nutrition/Potential for imbalanced nutrition Goals: Patient/caregiver agrees to and verbalizes understanding of need to use nutritional supplements and/or vitamins as prescribed Date Initiated: 08/10/2017 Target Resolution Date: 12/08/2017 Goal Status: Active Interventions: Assess patient  nutrition upon admission and as needed per policy Notes: ` Orientation to the Wound Care Program Nursing Diagnoses: Knowledge deficit related  to the wound healing center program Goals: Patient/caregiver will verbalize understanding of the [place] Program Date Initiated: 08/10/2017 Target Resolution Date: 09/08/2017 [name], [name] ([id number]) Goal Status: Active Interventions: Provide education on orientation to the wound center Notes: ` Pain, Acute or Chronic Nursing Diagnoses: Pain, acute or chronic: actual or potential Potential alteration in comfort, pain Goals: Patient/caregiver will verbalize adequate pain control between visits Date Initiated: 08/10/2017 Target Resolution Date: 12/08/2017 Goal Status: Active Interventions: Complete pain assessment as per visit requirements Notes: ` Wound/Skin Impairment Nursing Diagnoses: Impaired tissue integrity Knowledge deficit related to ulceration/compromised skin integrity Goals: Ulcer/skin breakdown will have a volume reduction of 80% by week 12 Date Initiated: 08/10/2017 Target Resolution Date: 12/01/2017 Goal Status: Active Interventions: Assess patient/caregiver ability to perform ulcer/skin care regimen upon admission and as needed Notes: Electronic Signature(s) Signed: 10/11/2018 5:34:16 PM By: Montey Hora Entered By: Montey Hora on 10/11/2018 11:25:53 Gaffin, [name] ([id number]) -------------------------------------------------------------------------------- Pain Assessment Details Patient Name: [name] Date of Service: 10/11/2018 11:00 AM Medical Record Number: [id number] Patient Account Number: [id number] Date of Birth/Sex: 1923-11-07 (84 y.o. F) Treating RN: Montey Hora Primary Care Keali Mccraw: Grayland Ormond Other Clinician: Referring Lainey Nelson: Grayland Ormond Treating Milah Recht/Extender: Melburn Hake, HOYT Weeks in Treatment: 44 Active Problems Location of Pain Severity and Description of Pain Patient Has Paino No Site Locations Pain Management and Medication Current Pain Management: Electronic Signature(s) Signed:  10/11/2018 4:11:24 PM By: Lorine Bears RCP, RRT, CHT Signed: 10/11/2018 5:34:16 PM By: Montey Hora Entered By: Lorine Bears on 10/11/2018 11:01:08 Fleissner, [name] ([id number]) -------------------------------------------------------------------------------- Patient/Caregiver Education Details Patient Name: [name] Date of Service: 10/11/2018 11:00 AM Medical Record Number: [id number] Patient Account Number: [id number] Date of Birth/Gender: [birth year] (84 y.o. F) Treating RN: Montey Hora Primary Care Physician: Grayland Ormond Other Clinician: Referring Physician: Grayland Ormond Treating Physician/Extender: Sharalyn Ink in Treatment: 61 Education Assessment Education Provided To: Patient and Caregiver Education Topics Provided Wound/Skin Impairment: Handouts: Other: wound care asordered Methods: Demonstration, Explain/Verbal Responses: State content correctly Electronic Signature(s) Signed: 10/11/2018 5:34:16 PM By: Montey Hora Entered By: Montey Hora on 10/11/2018 11:44:54 Karp, [name] ([id number]) -------------------------------------------------------------------------------- Wound Assessment Details Patient Name: [name] Date of Service: 10/11/2018 11:00 AM Medical Record Number: [id number] Patient Account Number: [id number] Date of Birth/Sex: 13-Apr-1923 (84 y.o. F) Treating RN: Cornell Barman Primary Care Christol Thetford: Grayland Ormond Other Clinician: Referring Toi Stelly: Grayland Ormond Treating Lyndal Alamillo/Extender: Melburn Hake, HOYT Weeks in Treatment: 61 Wound Status Wound Number: 1 Primary Pressure Ulcer Etiology: Wound Location: Right Malleolus - Lateral Secondary Arterial Insufficiency Ulcer Wounding Event: Gradually Appeared Etiology: Date Acquired: 08/11/2015 Wound Status: Open Weeks Of Treatment: 61 Comorbid Cataracts, Congestive Heart Failure, Clustered Wound: No History: Hypertension,  Osteoarthritis, Neuropathy Photos Photo Uploaded By: Gretta Cool, BSN, RN, CWS, Kim on 10/11/2018 17:27:31 Wound Measurements Length: (cm) 0.6 Width: (cm) 0.4 Depth: (cm) 0.2 Area: (cm) 0.188 Volume: (cm) 0.038 % Reduction in Area: 87.7% % Reduction in Volume: 91.7% Epithelialization: Medium (34-66%) Tunneling: No Undermining: No Wound Description Classification: Category/Stage IV Foul Odor Wound Margin: Distinct, outline attached Slough/Fi Exudate Amount: Medium Exudate Type: Serous Exudate Color: amber After Cleansing: No brino Yes Wound Bed Granulation Amount: Small (1-33%) Exposed Structure Granulation Quality: Pink Fascia Exposed: No Necrotic Amount: Medium (34-66%) Fat Layer (Subcutaneous Tissue) Exposed: Yes Necrotic Quality: Adherent Slough Tendon Exposed: No Muscle Exposed: No  Joint Exposed: No Bone Exposed: No Periwound Skin Texture Fontenot, [name] ([id number]) Texture Color No Abnormalities Noted: No No Abnormalities Noted: No Callus: No Atrophie Blanche: No Crepitus: No Cyanosis: No Excoriation: No Ecchymosis: No Induration: No Erythema: No Rash: No Hemosiderin Staining: No Scarring: No Mottled: No Pallor: No Moisture Rubor: Yes No Abnormalities Noted: No Dry / Scaly: No Temperature / Pain Maceration: No Temperature: No Abnormality Tenderness on Palpation: Yes Wound Preparation Ulcer Cleansing: Rinsed/Irrigated with Saline Topical Anesthetic Applied: Other: lidocaine 4%, Treatment Notes Wound #1 (Right, Lateral Malleolus) Notes right - prisma, left ankle - silvercel, eft heel - antibiotic ointment and BFD on all Electronic Signature(s) Signed: 10/11/2018 5:51:21 PM By: Gretta Cool, BSN, RN, CWS, Kim RN, BSN Entered By: Gretta Cool, BSN, RN, CWS, Kim on 10/11/2018 11:13:47 Sigala, [name] ([id number]) -------------------------------------------------------------------------------- Wound Assessment Details Patient Name: [name] Date of Service: 10/11/2018 11:00 AM Medical Record Number: [id number] Patient Account Number: [id number] Date of Birth/Sex: 1922/12/22 (84 y.o. F) Treating RN: Cornell Barman Primary Care Leontine Radman: Grayland Ormond Other Clinician: Referring Xaria Judon: Grayland Ormond Treating Anees Vanecek/Extender: Melburn Hake, HOYT Weeks in Treatment: 61 Wound Status Wound Number: 2 Primary Pressure Ulcer Etiology: Wound Location: Left Malleolus - Lateral Secondary Arterial Insufficiency Ulcer Wounding Event: Gradually Appeared Etiology: Date Acquired: 04/12/2018 Wound Status: Open Weeks Of Treatment: 25 Comorbid Cataracts, Congestive Heart Failure, Clustered Wound: No History: Hypertension, Osteoarthritis, Neuropathy Photos Photo Uploaded By: Gretta Cool, BSN, RN, CWS, Kim on 10/11/2018 17:28:07 Wound Measurements Length: (cm) 1.4 % Reducti Width: (cm) 1 % Reducti Depth: (cm) 0.6 Epithelia Area: (cm) 1.1 Undermin Volume: (cm) 0.66 Start Ending Maximu on in Area: -40.1% on in Volume: -735.4% lization: None ing: Yes ing Position (o'clock): 4 Position (o'clock): 8 m Distance: (cm) 0.2 Wound Description Classification: Category/Stage IV Foul Odor Wound Margin: Epibole Slough/Fi Exudate Amount: Medium Exudate Type: Serosanguineous Exudate Color: red, brown After Cleansing: No brino Yes Wound Bed Granulation Amount: Large (67-100%) Exposed Structure Granulation Quality: Pink, Pale Fascia Exposed: No Necrotic Amount: Small (1-33%) Fat Layer (Subcutaneous Tissue) Exposed: Yes Necrotic Quality: Adherent Slough Tendon Exposed: No Muscle Exposed: No Joint Exposed: No Baltazar, [name] ([id number]) Bone Exposed: No Periwound Skin Texture Texture Color No Abnormalities Noted: No No Abnormalities Noted: No Callus: No Atrophie Blanche: No Crepitus: No Cyanosis: No Excoriation: No Ecchymosis: No Induration: No Erythema: No Rash: No Hemosiderin Staining: No Scarring: No Mottled:  No Pallor: No Moisture Rubor: No No Abnormalities Noted: No Dry / Scaly: No Temperature / Pain Maceration: Yes Temperature: No Abnormality Tenderness on Palpation: Yes Wound Preparation Ulcer Cleansing: Rinsed/Irrigated with Saline Topical Anesthetic Applied: Other: lidocaine 4%, Treatment Notes Wound #2 (Left, Lateral Malleolus) Notes right - prisma, left ankle - silvercel, eft heel - antibiotic ointment and BFD on all Electronic Signature(s) Signed: 10/11/2018 5:51:21 PM By: Gretta Cool, BSN, RN, CWS, Kim RN, BSN Entered By: Gretta Cool, BSN, RN, CWS, Kim on 10/11/2018 11:12:54 Nocera, [name] ([id number]) -------------------------------------------------------------------------------- Wound Assessment Details Patient Name: [name] Date of Service: 10/11/2018 11:00 AM Medical Record Number: [id number] Patient Account Number: [id number] Date of Birth/Sex: 1923/05/30 (84 y.o. F) Treating RN: Cornell Barman Primary Care Florella Mcneese: Grayland Ormond Other Clinician: Referring Dannielle Baskins: Grayland Ormond Treating Zoila Ditullio/Extender: Melburn Hake, HOYT Weeks in Treatment: 61 Wound Status Wound Number: 3 Primary Pressure Ulcer Etiology: Wound Location: Left Calcaneus Wound Open Wounding Event: Gradually Appeared Status: Date Acquired: 10/04/2018 Comorbid Cataracts, Congestive Heart Failure, Weeks Of Treatment: 0 History: Hypertension, Osteoarthritis, Neuropathy Clustered Wound: No Photos Photo Uploaded By: Gretta Cool,  BSN, RN, CWS, Kim on 10/11/2018 17:28:08 Wound Measurements Length: (cm) 1.4 Width: (cm) 2.5 Depth: (cm) 0.2 Area: (cm) 2.749 Volume: (cm) 0.55 % Reduction in Area: 0% % Reduction in Volume: 0% Epithelialization: None Tunneling: No Undermining: No Wound Description Classification: Category/Stage II Foul Odor Wound Margin: Indistinct, nonvisible Slough/Fi Exudate Amount: Large Exudate Type: Serous Exudate Color: amber After Cleansing: No brino No Wound  Bed Granulation Amount: None Present (0%) Exposed Structure Necrotic Amount: None Present (0%) Fascia Exposed: No Fat Layer (Subcutaneous Tissue) Exposed: No Tendon Exposed: No Muscle Exposed: No Joint Exposed: No Bone Exposed: No Limited to Skin Breakdown Periwound Skin Texture Tobler, [name] ([id number]) Texture Color No Abnormalities Noted: No No Abnormalities Noted: No Callus: No Atrophie Blanche: No Crepitus: No Cyanosis: No Excoriation: No Ecchymosis: No Induration: No Erythema: No Rash: No Hemosiderin Staining: No Scarring: No Mottled: No Pallor: No Moisture Rubor: No No Abnormalities Noted: No Dry / Scaly: No Maceration: Yes Wound Preparation Ulcer Cleansing: Rinsed/Irrigated with Saline Topical Anesthetic Applied: Other: lidocaine 4%, Treatment Notes Wound #3 (Left Calcaneus) Notes right - prisma, left ankle - silvercel, eft heel - antibiotic ointment and BFD on all Electronic Signature(s) Signed: 10/11/2018 1:14:28 PM By: Worthy Keeler PA-C Signed: 10/11/2018 5:51:21 PM By: Gretta Cool, BSN, RN, CWS, Kim RN, BSN Entered By: Worthy Keeler on 10/11/2018 13:03:27 Matzek, [name] ([id number]) -------------------------------------------------------------------------------- [name] Details Patient Name: [name] Date of Service: 10/11/2018 11:00 AM Medical Record Number: [id number] Patient Account Number: [id number] Date of Birth/Sex: 1923-06-20 (84 y.o. F) Treating RN: Montey Hora Primary Care Anecia Nusbaum: Grayland Ormond Other Clinician: Referring Edina Winningham: Grayland Ormond Treating Elliyah Liszewski/Extender: Melburn Hake, HOYT Weeks in Treatment: 61 Vital Signs Time Taken: 10:57 Temperature (F): 98.3 Height (in): 65 Pulse (bpm): 75 Weight (lbs): 154.3 Respiratory Rate (breaths/min): 16 Body Mass Index (BMI): 25.7 Blood Pressure (mmHg): 111/50 Reference Range: 80 - 120 mg / dl Electronic Signature(s) Signed: 10/11/2018 4:11:24 PM By: Lorine Bears RCP, RRT, CHT Entered By: Lorine Bears on 10/11/2018 11:04:14

## 2018-10-14 NOTE — Progress Notes (Signed)
ZIVAH, MAYR ([id number]) Visit Report for 10/11/2018 Chief Complaint Document Details Patient Name: [name], [name]. Date of Service: 10/11/2018 11:00 AM Medical Record Number: [id number] Patient Account Number: [id number] Date of Birth/Sex: 1923/11/02 (84 y.o. F) Treating RN: Montey Hora Primary Care Provider: Grayland Ormond Other Clinician: Referring Provider: Grayland Ormond Treating Provider/Extender: Melburn Hake, HOYT Weeks in Treatment: 64 Information Obtained from: Patient Chief Complaint Patient is here for right lateral malleolus and left lateral malleolus ulcer Electronic Signature(s) Signed: 10/11/2018 1:14:28 PM By: Worthy Keeler PA-C Entered By: Worthy Keeler on 10/11/2018 11:24:29 [name], [name] ([id number]) -------------------------------------------------------------------------------- Debridement Details Patient Name: [name] Date of Service: 10/11/2018 11:00 AM Medical Record Number: [id number] Patient Account Number: [id number] Date of Birth/Sex: Feb 14, 1923 (84 y.o. F) Treating RN: Montey Hora Primary Care Provider: Grayland Ormond Other Clinician: Referring Provider: Grayland Ormond Treating Provider/Extender: Melburn Hake, HOYT Weeks in Treatment: 61 Debridement Performed for Wound #1 Right,Lateral Malleolus Assessment: Performed By: Physician STONE III, HOYT E., PA-C Debridement Type: Debridement Severity of Tissue Pre Fat layer exposed Debridement: Level of Consciousness (Pre- Awake and Alert procedure): Pre-procedure Verification/Time Yes - 11:30 Out Taken: Start Time: 11:30 Pain Control: Lidocaine 4% Topical Solution Total Area Debrided (L x W): 0.6 (cm) x 0.4 (cm) = 0.24 (cm) Tissue and other material Viable, Non-Viable, Slough, Subcutaneous, Fibrin/Exudate, Slough debrided: Level: Skin/Subcutaneous Tissue Debridement Description: Excisional Instrument: Curette Bleeding: Minimum Hemostasis Achieved:  Pressure End Time: 11:32 Procedural Pain: 0 Post Procedural Pain: 0 Response to Treatment: Procedure was tolerated well Level of Consciousness Awake and Alert (Post-procedure): Post Debridement Measurements of Total Wound Length: (cm) 0.6 Stage: Category/Stage IV Width: (cm) 0.4 Depth: (cm) 0.3 Volume: (cm) 0.057 Character of Wound/Ulcer Post Improved Debridement: Severity of Tissue Post Debridement: Fat layer exposed Post Procedure Diagnosis Same as Pre-procedure Electronic Signature(s) Signed: 10/11/2018 1:14:28 PM By: Worthy Keeler PA-C Signed: 10/11/2018 5:34:16 PM By: Montey Hora Entered By: Montey Hora on 10/11/2018 11:32:15 [name], [name] ([id number]) -------------------------------------------------------------------------------- Debridement Details Patient Name: [name] Date of Service: 10/11/2018 11:00 AM Medical Record Number: [id number] Patient Account Number: [id number] Date of Birth/Sex: [birth year] (84 y.o. F) Treating RN: Montey Hora Primary Care Provider: Grayland Ormond Other Clinician: Referring Provider: Grayland Ormond Treating Provider/Extender: Melburn Hake, HOYT Weeks in Treatment: 61 Debridement Performed for Wound #3 Left Calcaneus Assessment: Performed By: Physician STONE III, HOYT E., PA-C Debridement Type: Debridement Level of Consciousness (Pre- Awake and Alert procedure): Pre-procedure Verification/Time Yes - 11:32 Out Taken: Start Time: 11:32 Pain Control: Lidocaine 4% Topical Solution Total Area Debrided (L x W): 1.4 (cm) x 2.5 (cm) = 3.5 (cm) Tissue and other material Non-Viable, Skin: Dermis , Skin: Epidermis debrided: Level: Skin/Epidermis Debridement Description: Selective/Open Wound Instrument: Curette, Forceps, Scissors Bleeding: Minimum Hemostasis Achieved: Pressure End Time: 11:36 Procedural Pain: 0 Post Procedural Pain: 0 Response to Treatment: Procedure was tolerated well Level of  Consciousness Awake and Alert (Post-procedure): Post Debridement Measurements of Total Wound Length: (cm) 1.4 Stage: Category/Stage II Width: (cm) 2.5 Depth: (cm) 0.1 Volume: (cm) 0.275 Character of Wound/Ulcer Post Improved Debridement: Post Procedure Diagnosis Same as Pre-procedure Electronic Signature(s) Signed: 10/11/2018 1:14:28 PM By: Worthy Keeler PA-C Signed: 10/11/2018 5:34:16 PM By: Montey Hora Entered By: Montey Hora on 10/11/2018 11:38:47 [name], [name] ([id number]) -------------------------------------------------------------------------------- Debridement Details Patient Name: [name] Date of Service: 10/11/2018 11:00 AM Medical Record Number: [id number] Patient Account Number: [id number] Date of Birth/Sex: [birth year] (84 y.o. F) Treating RN: Montey Hora Primary Care Provider: Venetia Maxon,  JASON Other Clinician: Referring Provider: Grayland Ormond Treating Provider/Extender: STONE III, HOYT Weeks in Treatment: 61 Debridement Performed for Wound #2 Left,Lateral Malleolus Assessment: Performed By: Physician STONE III, HOYT E., PA-C Debridement Type: Debridement Severity of Tissue Pre Fat layer exposed Debridement: Level of Consciousness (Pre- Awake and Alert procedure): Pre-procedure Verification/Time Yes - 11:36 Out Taken: Start Time: 11:36 Pain Control: Lidocaine 4% Topical Solution Total Area Debrided (L x W): 1.4 (cm) x 1 (cm) = 1.4 (cm) Tissue and other material Viable, Non-Viable, Slough, Subcutaneous, Fibrin/Exudate, Slough debrided: Level: Skin/Subcutaneous Tissue Debridement Description: Excisional Instrument: Curette Bleeding: Minimum Hemostasis Achieved: Pressure End Time: 11:42 Procedural Pain: 0 Post Procedural Pain: 0 Response to Treatment: Procedure was tolerated well Level of Consciousness Awake and Alert (Post-procedure): Post Debridement Measurements of Total Wound Length: (cm) 1.4 Stage:  Category/Stage IV Width: (cm) 1 Depth: (cm) 0.8 Volume: (cm) 0.88 Character of Wound/Ulcer Post Improved Debridement: Severity of Tissue Post Debridement: Fat layer exposed Post Procedure Diagnosis Same as Pre-procedure Electronic Signature(s) Signed: 10/11/2018 1:14:28 PM By: Worthy Keeler PA-C Signed: 10/11/2018 5:34:16 PM By: Montey Hora Entered By: Montey Hora on 10/11/2018 11:42:15 [name], [name] ([id number]) -------------------------------------------------------------------------------- HPI Details Patient Name: [name] Date of Service: 10/11/2018 11:00 AM Medical Record Number: [id number] Patient Account Number: [id number] Date of Birth/Sex: Apr 25, 1923 (84 y.o. 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 Dr. Amalia Hailey the podiatrist saw her on 07/20/2017 and sent her to the wound center for possible hyperbaric oxygen therapy. past medical history of peripheral vascular disease, varicose veins, status post appendectomy, basal cell carcinoma excision from the left leg, cholecystectomy, pacemaker placement, right lower extremity angiography done by Dr. dew in March 2017 with placement of a stent. there is also note of a successful ablation of the right small saphenous vein done which was reviewed by ultrasound on 10/24/2016. the patient had a right small saphenous vein ablation done on 10/20/2016. The patient has never been a smoker. She has been seen by Dr. Corene Cornea dew the vascular surgeon who most recently saw her on 06/15/2017 for evaluation of ongoing problems with right leg  swelling. She had a lower extremity arterial duplex examination done(02/13/17) which showed patent distal right superficial femoral artery stent and above-the-knee popliteal stent without evidence of restenosis. The ABI was more than 1.3 on the right and more than 1.3 on the left. This was consistent with noncompressible arteries due to medial calcification. The right great toe pressure and PPG waveforms are within normal limits and the left great toe pressure and PPG waveforms are decreased. he recommended she continue to wear her compression stockings and continue with elevation. She is scheduled to have a noninvasive arterial study in the near future 08/16/2017 -- had a lower extremity arterial duplex examination done which showed patent distal right superficial femoral artery stent and above-the-knee popliteal stent without evidence of restenosis. The ABI was more than 1.3 on the right and more than 1.3 on the left. This was consistent with noncompressible arteries due to medial calcification. The right great toe pressure and PPG waveforms are within normal limits and the left great toe pressure and PPG waveforms are decreased. the x-ray of the right ankle  has not yet been done 08/24/2017 -- had a right ankle x-ray -- IMPRESSION:1. No fracture, bone lesion or evidence of osteomyelitis. 2. Lateral soft tissue swelling with a soft tissue ulcer. she has not yet seen the vascular surgeon for review 08/31/17 on evaluation today patient's wound appears to be showing signs of improvement. She still with her appointment with vascular in order to review her results of her vascular study and then determine if any intervention would be recommended at that time. No fevers, chills, nausea, or vomiting noted at this time. She has been tolerating the dressing changes without complication. 09/28/17 on evaluation today patient's wound appears to show signs of good improvement in regard to the granulation  tissue which is surfacing. There is still a layer of slough covering the wound and the posterior portion is still significantly deeper than the anterior nonetheless there has been some good sign of things moving towards the better. She is going to go back to Dr. dew for reevaluation to ensure her blood flow is still appropriate. That will be before her next evaluation with [place] next week. No fevers, chills, nausea, or vomiting noted at this time. Patient does have some discomfort rated to be a 3-4/10 depending on activity specifically cleansing the wound makes it worse. 10/05/2017 -- the patient was seen by Dr. Lucky Cowboy last week and noninvasive studies showed a normal right ABI with brisk triphasic waveforms consistent with no arterial insufficiency including normal digital pressures. The duplex showed a patent distal right SFA stent and the proximal SFA was also normal. He was pleased with her test and thought she should have enough of perfusion for normal wound healing. He would see her back in 6 months time. 12/21/17 on evaluation today patient appears to be doing fairly well in regard to her right lateral ankle wound. Unfortunately the main issue that she is expansion at this point is that she is having some issues with what appears to be some cellulitis in the IDARA, WOODSIDE. ([id number]) right anterior shin. She has also been noting a little bit of uncomfortable feeling especially last night and her ankle area. I'm afraid that she made the developing a little bit of an infection. With that being said I think it is in the early stages. 12/28/17 on evaluation today patient's ankle appears to be doing excellent. She's making good progress at this point the cellulitis seems to have improved after last week's evaluation. Overall she is having no significant discomfort which is excellent news. She does have an appointment with Dr. dew on March 29, 2018 for reevaluation in regard to the stent he placed.  She seems to have excellent blood flow in the right lower extremity. 01/19/12 on evaluation today patient's wound appears to be doing very well. In fact she does not appear to require debridement at this point, there's no evidence of infection, and overall from the standpoint of the wound she seems to be doing very well. With that being said I believe that it may be time to switch to different dressing away from the [facility] she tells me she does have a lot going on her friend actually passed away yesterday and she's also having a lot of issues with her husband this obviously is weighing heavy on her as far as your thoughts and concerns today. 01/25/18 on evaluation today patient appears to be doing fairly well in regard to her right lateral malleolus. She has been tolerating the dressing changes without complication. Overall  I feel like this is definitely showing signs of improvement as far as how the overall appearance of the wound is there's also evidence of epithelium start to migrate over the granulation tissue. In general I think that she is progressing nicely as far as the wound is concerned. The only concern she really has is whether or not we can switch to every other week visits in order to avoid having as many appointments as her daughters have a difficult time getting her to her appointments as well as the patient's husband to his he is not doing very well at this point. 02/22/18 on evaluation today patient's right lateral malleolus ulcer appears to be doing great. She has been tolerating the dressing changes without complication. Overall you making excellent progress at this time. Patient is having no significant discomfort. 03/15/18 on evaluation today patient appears to be doing much more poorly in regard to her right lateral ankle ulcer at this point. Unfortunately since have last seen her her husband has passed just a few days ago is obviously weighed heavily on her her  daughter also had surgery well she is with her today as usual. There does not appear to be any evidence of infection she does seem to have significant contusion/deep tissue injury to the right lateral malleolus which was not noted previous when I saw her last. It's hard to tell of exactly when this injury occurred although during the time she was spending the night in the hospital this may have been most likely. 03/22/18 on evaluation today patient appears to actually be doing very well in regard to her ulcer. She did unfortunately have a setback which was noted last week however the good news is we seem to be getting back on track and in fact the wound in the core did still have some necrotic tissue which will be addressed at this point today but in general I'm seeing signs that things are on the up and up. She is glad to hear this obviously she's been somewhat concerned that due to the how her wound digressed more recently. 03/29/18 on evaluation today patient appears to be doing fairly well in regard to her right lower extremity lateral malleolus ulcer. She unfortunately does have a new area of pressure injury over the inferior portion where the wound has opened up a little bit larger secondary to the pressure she seems to be getting. She does tell me sometimes when she sleeps at night that it actually hurts and does seem to be pushing on the area little bit more unfortunately. There does not appear to be any evidence of infection which is good news. She has been tolerating the dressing changes without complication. She also did have some bruising in the left second and third toes due to the fact that she may have bump this or injured it although she has neuropathy so she does not feel she did move recently that may have been where this came from. Nonetheless there does not appear to be any evidence of infection at this time. 04/12/18 on evaluation today patient's wound on the right lateral ankle  actually appears to be doing a little bit better with a lot of necrotic docking tissue centrally loosening up in clearing away. However she does have the beginnings of a deep tissue injury on the left lateral malleolus likely due to the fact we've been trying offload the right as much as we have. I think she may benefit from an assistive soft device  to help with offloading and it looks like they're looking at one of the doughnut conditions that wraps around the lower leg to offload which I think will definitely do a good job. With that being said I think we definitely need to address this issue on the left before it becomes a wound. Patient is not having significant pain. 04/19/18 on evaluation today patient appears to be doing excellent in regard to the progress she's made with her right lateral ankle ulcer. The left ankle region which did show evidence of a deep tissue injury seems to be resolving there's little fluid noted underneath and a blister there's nothing open at this point in time overall I feel like this is progressing nicely which is good news. She does not seem to be having significant discomfort at this point which is also good news. 04/25/18-She is here in follow up evaluation for bilateral lateral malleolar ulcers. The right lateral malleolus ulcer with pale subcutaneous tissue exposure, central area of ulcer with tendon/periosteum exposed. The left lateral malleolus ulcer now with Watkinson, [name] ([id number]) central area of nonviable tissue, otherwise deep tissue injury. She is wearing compression wraps to the left lower extremity, she will place the right lower extremity compression wraps on when she gets home. She will be out of town over the weekend and return next week and follow-up appointment. She completed her doxycycline this morning 05/03/18 on evaluation today patient appears to be doing very well in regard to her right lateral ankle ulcer in general. At least she's  showing some signs of improvement in this regard. Unfortunately she has some additional injury to the left lateral malleolus region which appears to be new likely even over the past several days. Again this determination is based on the overall appearance. With that being said the patient is obviously frustrated about this currently. 05/10/18-She is here in follow-up evaluation for bilateral lateral malleolar ulcers. She states she has purchased offloading shoes/boots and they will arrive tomorrow. She was asked to bring them in the office at next week's appointment so her provider is aware of product being utilized. She continues to sleep on right or left side, she has been encouraged to sleep on her back. The right lateral malleolus ulcer is precariously close to peri-osteum; will order xray. The left lateral malleolus ulcer is improved. Will switch back to santyl; she will follow up next week. 05/17/18 on evaluation today patient actually appears to be doing very well in regard to her malleolus her ulcers compared to last time I saw them. She does not seem to have as much in the way of contusion at this point which is great news. With that being said she does continue to have discomfort and I do believe that she is still continuing to benefit from the offloading/pressure reducing boots that were recommended. I think this is the key to trying to get this to heal up completely. 05/24/18 on evaluation today patient actually appears to be doing worse at this point in time unfortunately compared to her last week's evaluation. She is having really no increased pain which is good news unfortunately she does have more maceration in your theme and noted surrounding the right lateral ankle the left lateral ankle is not really is erythematous I do not see signs of the overt cellulitis on that side. Unfortunately the wounds do not seem to have shown any signs of improvement since the last evaluation. She also has  significant swelling especially on the right  compared to previous some of this may be due to infection however also think that she may be served better while she has these wounds by compression wrapping versus continuing to use the Juxta-Lite for the time being. Especially with the amount of drainage that she is experiencing at this point. No fevers, chills, nausea, or vomiting noted at this time. 05/31/18 on evaluation today patient appears to actually be doing better in regard to her right lateral lower extremity ulcer specifically on the malleolus region. She has been tolerating the antibiotic without complication. With that being said she still continues to have issues but a little bit of redness although nothing like she what she was experiencing previous. She still continues to pressure to her ankle area she did get the problem on offloading boots unfortunately she will not wear them she states there too uncomfortable and she can't get in and out of the bed. Nonetheless at this point her wounds seem to be continually getting worse which is not what we want I'm getting somewhat concerned about her progress and how things are going to proceed if we do not intervene in some way shape or form. I therefore had a very lengthy conversation today about offloading yet again and even made a specific suggestion for switching her to a memory foam mattress and even gave the information for a specific one that they could look at getting if it was something that they were interested in considering. She does not want to be considered for a hospital bed air mattress although honestly insurance would not cover it that she does not have any wounds on her trunk. 06/14/18 on evaluation today both wounds over the bilateral lateral malleolus her ulcers appear to be doing better there's no evidence of pressure injury at this point. She did get the foam mattress for her bed and this does seem to have been extremely  beneficial for her in my pinion. Her daughter states that she is having difficulty getting out of bed because of how soft it is. The patient also relates this to be. Nonetheless I do feel like she's actually doing better. Unfortunately right after and around the time she was getting the mattress she also sustained a fall when she got up to go pick up the phone and ended up injuring her right elbow she has 18 sut[address] place. We are not caring for this currently although home health is going to be taking the sutures out shortly. Nonetheless this may be something that we need to evaluate going forward. It depends on how well it has or has not healed in the end. She also recently saw an orthopedic specialist for an injection in the right shoulder just before her fall unfortunately the fall seems to have worsened her pain. 06/21/18 on evaluation today patient appears to be doing about the same in regard to her lateral malleolus ulcers. Both appear to be just a little bit deeper but again we are clinging away the necrotic and dead tissue which I think is why this is progressing towards a deeper realm as opposed to improving from my measurement standpoint in that regard. Nonetheless she has been tolerating the dressing changes she absolutely hates the memory foam mattress topper that was obtained for her nonetheless I do believe this is still doing excellent as far as taking care of excess pressure in regard to the lateral malleolus regions. She in fact has no pressure injury that I see whereas in weeks past it  was week by week I was constantly seeing new pressure injuries. Overall I think it has been very beneficial for her. 07/03/18; patient arrives in my clinic today. She has deep punched out areas over her bilateral lateral malleoli. The area on the right has some more depth. [name], [name] ([id number]) We spent a lot of time today talking about pressure relief for these areas. This started when  her daughter asked for a prescription for a memory foam mattress. I have never written a prescription for a mattress and I don't think insurances would pay for that on an ordinary bed. In any case he came up that she has foam boots that she refuses to wear. I would suggest going to these before any other offloading issues when she is in bed. They say she is meticulous about offloading this the rest of the day 07/10/18- She is seen in follow-up evaluation for bilateral, lateral malleolus ulcers. There is no improvement in the ulcers. She has purchased and is sleeping on a memory foam mattress/overlay, she has been using the offloading boots nightly over the past week. She has a follow up appointment with vascular medicine at the end of October, in my opinion this follow up should be expedited given her deterioration and suboptimal TBI results. We will order plain film xray of the left ankle as deeper structures are palpable; would consider having MRI, regardless of xray report(s). The ulcers will be treated with iodoflex/iodosorb, she is unable to safely change the dressings daily with santyl. 07/19/18 on evaluation today patient appears to be doing in general visually well in regard to her bilateral lateral malleolus ulcers. She has been tolerating the dressing changes without complication which is good news. With that being said we did have an x-ray performed on 07/12/18 which revealed a slight loosen see in the lateral portion of the distal left fibula which may represent artifact but underline lytic destruction or osteomyelitis could not be excluded. MRI was recommended. With that being said we can see about getting the patient scheduled for an MRI to further evaluate this area. In fact we have that scheduled currently for August 20 19,019. 07/26/18 on evaluation today patient's wound on the right lateral ankle actually appears to be doing fairly well at this point in my pinion. She has made some good  progress currently. With that being said unfortunately in regard to the left lateral ankle ulcer this seems to be a little bit more problematic at this time. In fact as I further evaluated the situation she actually had bone exposed which is the first time that's been the case in the bone appear to be necrotic. Currently I did review patient's note from Dr. Bunnie Domino office with [facility]. He stated that ABI was 1.26 on the right and 0.95 on the left with good waveforms. Her perfusion is stable not reduced from previous studies and her digital waveforms were pretty good particularly on the right. His conclusion upon review of the note was that there was not much she could do to improve her perfusion and he felt she was adequate for wound healing. His suggestion was that she continued to see [place] and consider a synthetic skin graft if there was no underlying infection. He plans to see her back in six months or as needed. 08/01/18 on evaluation today patient appears to be doing better in regard to her right lateral ankle ulcer. Her left lateral ankle ulcer is about the same she still  has bone involvement in evidence of necrosis. There does not appear to be evidence of infection at this time On the right lateral lower extremity. I have started her on the Augmentin she picked this up and started this yesterday. This is to get her through until she sees infectious disease which is scheduled for 08/12/18. 08/06/18 on evaluation today patient appears to be doing rather well considering my discussion with patient's daughter at the end of last week. The area which was marked where she had erythema seems to be improved and this is good news. With that being said overall the patient seems to be making good improvement when it comes to the overall appearance of the right lateral ankle ulcer although this has been slow she at least is coming around in this regard. Unfortunately in regard to  the left lateral ankle ulcer this is osteomyelitis based on the pathology report as well is bone culture. Nonetheless we are still waiting CT scan. Unfortunately the MRI we originally ordered cannot be performed as the patient is a pacemaker which I had overlooked. Nonetheless we are working on the CT scan approval and scheduling as of now. She did go to the hospital over the weekend and was placed on IV Cefzo for a couple of days. Fortunately this seems to have improved the erythema quite significantly which is good news. There does not appear to be any evidence of worsening infection at this time. She did have some bleeding after the last debridement therefore I did not perform any sharp debridement in regard to left lateral ankle at this point. Patient has been approved for a snap vac for the right lateral ankle. 08/14/18; the patient with wounds over her bilateral lateral malleoli. The area on the right actually looks quite good. Been using a snap back on this area. Healthy granulation and appears to be filling in. Unfortunately the area on the left is really problematic. 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 Also noted to have cellulitis. She saw Dr. Novella Olive of infectious disease today and was put on linezolid. We are able to verify this with her pharmacy. She is completed the Augmentin that she was already on. We've been using Iodoflex to this area 08/23/18 on evaluation today patient's wounds both actually appear to be doing better compared to my prior evaluations. Fortunately she showing signs of good improvement in regard to the overall wound status especially where were using the snap vac on the right. In regard to left lateral malleolus the wound bed actually appears to be much cleaner than previously noted. I do not feel any phone directly probed during evaluation today and though there is tendon noted  this does not appear Garland, [name] ([id number]) to be necrotic it's actually fairly good as far as the overall appearance of the tendon is concerned. In general the wound bed actually appears to be doing significantly better than it was previous. Patient is currently in the care of [name] and I did review that note today. He actually has her on two weeks of linezolid and then following the patient will be on 1-2 months of Keflex. That is the plan currently. She has been on antibiotics therapy as prescribed by myself initially starting on July 30, 2018 and has been on that continuously up to this point. 08/30/18 on evaluation today patient actually appears to be doing much better in regard to her  right lateral malleolus ulcer. She has been tolerating the dressing changes specifically the snap vac without complication although she did have some issues with the seal currently. Apparently there was some trouble with getting it to maintain over the past week past Sunday. Nonetheless overall the wound appears better in regard to the right lateral malleolus region. In regard to left lateral malleolus this actually show some signs of additional granulation although there still tendon noted in the base of the wound this appears to be healthy not necrotic in any way whatsoever. We are considering potentially using a snap vac for the left lateral malleolus as well the product wrap from [employer], [employer], was present in the clinic today we're going to see this patient I did have her come in with me after obtaining consent from the patient and her daughter in order to look at the wound and see if there's any recommendation one way or another as to whether or not they felt the snapback could be beneficial for the left lateral malleolus region. But the conclusion was that it might be but that this is definitely a little bit deeper wound than what traditionally would be utilized for a snap vac. 09/06/18 on  evaluation today patient actually appears to be doing excellent in my pinion in regard to both ankle ulcers. She has been tolerating the dressing changes without complication which is great news. Specifically we have been using the snap vac. In regard to the right ankle I'm not even sure that this is going to be necessary for today and following as the wound has filled in quite nicely. In regard to the left ankle I do believe that we're seeing excellent epithelialization from the edge as well as granulation in the central portion the tendon is still exposed but there's no evidence of necrotic bone and in general I feel like the patient has made excellent progress even compared to last week with just one week of the snap vac. 09/11/18; this is a patient who has wounds on her bilateral lateral malleoli. Initially both of these were deep stage IV wounds in the setting of chronic arterial insufficiency. She has been revascularized. As I understand think she been using snap vacs to both of these wounds however the area on the right became more superficial and currently she is only using it on the left. Using silver collagen on the right and silver collagen under the back on the left I believe 09/19/18 on evaluation today patient actually appears to be doing very well in regard to her lateral malleolus or ulcers bilaterally. She has been tolerating the dressing changes without complication. Fortunately there does not appear to be any evidence of infection at this time. Overall I feel like she is improving in an excellent manner and I'm very pleased with the fact that everything seems to be turning towards the better for her. This has obviously been a long road. 09/27/18 on evaluation today patient actually appears to be doing very well in regard to her bilateral lateral malleolus ulcers. She has been tolerating the dressing changes without complication. Fortunately there does not appear to be any evidence  of infection at this time which is also great news. No fevers, chills, nausea, or vomiting noted at this time. Overall I feel like she is doing excellent with the snap vac on the left malleolus. She had 40 mL of fluid collection over the past week. 10/04/18 on evaluation today patient actually appears to be doing well in  regard to her bilateral lateral malleolus ulcers. She continues to tolerate the dressing changes without complication. One issue that I see is the snap vac on the left lateral malleolus which appears to have sealed off some fluid underlying this area and has not really allowed it to heal to the degree that I would like to see. For that reason I did suggest at this point we may want to pack a small piece of packing strip into this region to allow it to more effectively wick out fluid. 10/11/18 in general the patient today does not feel that she has been doing very well. She's been a little bit lethargic and subsequently is having bodyaches as well according to what she tells me today. With that being said overall she has been concerned with the fact that something may be worsening although to be honest her wounds really have not been appearing poorly. She does have a new ulcer on her left heel unfortunately. This may be pressure related. Nonetheless it seems to me to have potentially started at least as a blister I do not see any evidence of deep tissue injury. In regard to the left ankle the snap vac still seems to be causing the ceiling off of the deeper part of the wound which is in turn trapping fluid. I'm not extremely pleased with the overall appearance as far as progress from last week to this week therefore I'm gonna discontinue the snap vac at this point. Electronic Signature(s) Signed: 10/11/2018 1:14:28 PM By: Sherene Sires, [name] ([id number]) Entered By: Worthy Keeler on 10/11/2018 13:04:17 [name] [name]  ([id number]) -------------------------------------------------------------------------------- Physical Exam Details Patient Name: CAMILAH, SPILLMAN Date of Service: 10/11/2018 11:00 AM Medical Record Number: [id number] Patient Account Number: [id number] Date of Birth/Sex: [birth year] (84 y.o. F) Treating RN: Montey Hora Primary Care Provider: Grayland Ormond Other Clinician: Referring Provider: Grayland Ormond Treating Provider/Extender: STONE III, HOYT Weeks in Treatment: 47 Constitutional Well-nourished and well-hydrated in no acute distress. Respiratory normal breathing without difficulty. Psychiatric this patient is able to make decisions and demonstrates good insight into disease process. Alert and Oriented x 3. pleasant and cooperative. Notes Patient's wounds at all three locations did require sharp debridement today. She actually tolerated this without complication post debridement the wound bed appears to be much better at all locations. I'm still concerned about the fact that the left lateral ankle ulcer is much deeper than what I would really like to see. Nonetheless we're gonna have to keep an eye on this if anything seems to be changing of worsening we may have to get her back to see infectious disease as to whether or not she requires additional antibiotic therapy. Electronic Signature(s) Signed: 10/11/2018 1:14:28 PM By: Worthy Keeler PA-C Entered By: Worthy Keeler on 10/11/2018 13:04:57 [name], [name] ([id number]) -------------------------------------------------------------------------------- Physician Orders Details Patient Name: [name] Date of Service: 10/11/2018 11:00 AM Medical Record Number: [id number] Patient Account Number: [id number] Date of Birth/Sex: 07-29-1923 (84 y.o. 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 o Cleanse wound with mild soap and water o May Shower, gently pat wound dry prior to applying new dressing. Wound #2 Left,Lateral Malleolus o Clean wound with Normal Saline. o Cleanse wound with mild soap and water o May Shower, gently pat wound dry prior to applying new dressing. Wound #3 Left Calcaneus o Clean wound with Normal Saline. o Cleanse wound with mild soap and water o May Shower, gently pat wound dry prior to applying new dressing. Anesthetic (add to Medication List) Wound #1 Right,Lateral Malleolus o Topical Lidocaine 4% cream applied to wound bed prior to debridement (In Clinic Only). Wound #2 Left,Lateral Malleolus o Topical Lidocaine 4% cream applied to wound bed prior to debridement (In Clinic Only). Wound #3 Left Calcaneus o Topical Lidocaine 4% cream applied to wound bed prior to debridement (In Clinic Only). Primary Wound Dressing Wound #1 Right,Lateral Malleolus o Silver Collagen Wound #2 Left,Lateral Malleolus o Silver Alginate Safiyya, [place] [name] ([id number]) Wound #3 Left Calcaneus o Other: - antibiotic ointment Secondary Dressing Wound #1 Right,Lateral Malleolus o Boardered Foam Dressing Wound #2 Left,Lateral Malleolus o Boardered Foam Dressing Wound #3 Left Calcaneus o Boardered Foam Dressing Dressing Change Frequency Wound #1 Right,Lateral  Malleolus o Change Dressing Monday, Wednesday, Friday Wound #2 Left,Lateral Malleolus o Change Dressing Monday, Wednesday, Friday Wound #3 Left Calcaneus o Change Dressing Monday, Wednesday, Friday Follow-up Appointments Wound #1 Right,Lateral Malleolus o Return Appointment in 1 week. Wound #2 Left,Lateral Malleolus o Return Appointment in 1 week. Wound #3 Left Calcaneus o Return Appointment in 1 week. Edema Control Wound #1 Right,Lateral Malleolus o Patient to wear own compression stockings o Patient to wear own Velcro compression garment. o Elevate legs to the level of the heart and pump ankles as often as possible Wound #2 Left,Lateral Malleolus o Patient to wear own compression stockings o Patient to wear own Velcro compression garment. o Elevate legs to the level of the heart and pump ankles as often as possible Off-Loading Wound #1 Right,Lateral Malleolus o Turn and reposition every 2 hours o Other: - do not put pressure on your ankles Wound #2 Left,Lateral Malleolus o Turn and reposition every 2 hours o Other: - do not put pressure on your ankles Maynes, [name] ([id number]) Additional Orders / Instructions Wound #1 Right,Lateral Malleolus o Increase protein intake. Wound #2 Left,Lateral Malleolus o Increase protein intake. Home Health Wound #1 [place] Visits - [place] Nurse may visit PRN to address patientos wound care needs. o FACE TO FACE ENCOUNTER: MEDICARE and MEDICAID PATIENTS: I certify that this patient is under my care and that I had a face-to-face encounter that meets the physician face-to-face encounter requirements with this patient on this date. The encounter with the patient was in whole or in part for the following MEDICAL CONDITION: (primary reason for [place]) MEDICAL NECESSITY: I certify, that based on my findings, NURSING services are a  medically necessary home health service. HOME BOUND STATUS: I certify that my clinical findings support that this patient is homebound (i.e., Due to illness or injury, pt requires aid of supportive devices such as crutches, cane, wheelchairs, walkers, the use of special transportation or the assistance of another person to leave their place of residence. There is a normal inability to leave the home and doing so requires considerable and taxing effort. Other absences are for medical  reasons / religious services and are infrequent or of short duration when for other reasons). o If current dressing causes regression in wound condition, may D/C ordered dressing product/s and apply Normal Saline Moist Dressing daily until next [place] / Other MD appointment. [place] of regression in wound condition at [phone]. o Please direct any NON-WOUND related issues/requests for orders to patient's Primary Care Physician Wound #2 [place] Visits - [place] Nurse may visit PRN to address patientos wound care needs. o FACE TO FACE ENCOUNTER: MEDICARE and MEDICAID PATIENTS: I certify that this patient is under my care and that I had a face-to-face encounter that meets the physician face-to-face encounter requirements with this patient on this date. The encounter with the patient was in whole or in part for the following MEDICAL CONDITION: (primary reason for [place]) MEDICAL NECESSITY: I certify, that based on my findings, NURSING services are a medically necessary home health service. HOME BOUND STATUS: I certify that my clinical findings support that this patient is homebound (i.e., Due to illness or injury, pt requires aid of supportive devices such as crutches, cane, wheelchairs, walkers, the use of special transportation or the assistance of another person to leave their place of residence. There is a  normal inability to leave the home and doing so requires considerable and taxing effort. Other absences are for medical reasons / religious services and are infrequent or of short duration when for other reasons). o If current dressing causes regression in wound condition, may D/C ordered dressing product/s and apply Normal Saline Moist Dressing daily until next [place] / Other MD appointment. [place] of regression in wound condition at [phone]. o Please direct any NON-WOUND related issues/requests for orders to patient's Primary Care Physician Wound #3 Left Calcaneus o [place] Visits - [place] Nurse may visit PRN to address patientos wound care needs. o FACE TO FACE ENCOUNTER: MEDICARE and MEDICAID PATIENTS: I certify that this patient is under my care and that I had a face-to-face encounter that meets the physician face-to-face encounter requirements with this patient on this date. The encounter with the patient was in whole or in part for the following MEDICAL CONDITION: (primary reason for [place]) MEDICAL NECESSITY: I certify, that based on my findings, NURSING services are a medically necessary home health service. HOME BOUND STATUS: I certify that my clinical findings support that this patient is homebound (i.e., Due to illness or injury, pt requires aid of supportive devices such as crutches, cane, wheelchairs, walkers, the use of special transportation or the assistance of another person to leave their place of residence. There is a normal inability to leave the home and doing so requires considerable and taxing effort. Other absences are for medical reasons / religious services and are infrequent or of short duration when for other reasons). [name], [name] ([id number]) o If current dressing causes regression in wound condition, may D/C ordered dressing product/s and apply Normal Saline Moist  Dressing daily until next [place] / Other MD appointment. [place] of regression in wound condition at [phone]. o Please direct any NON-WOUND related issues/requests for orders to patient's Primary Care Physician Negative Pressure Wound Therapy Wound #2 Left,Lateral Malleolus o Place NPWT on HOLD. - Hold SNAP VAC for this week Electronic Signature(s) Signed: 10/11/2018 1:14:28 PM By: Worthy Keeler PA-C Signed: 10/11/2018 5:34:16 PM By: Montey Hora  Entered By: Montey Hora on 10/11/2018 11:43:49 Laffey, [name] ([id number]) -------------------------------------------------------------------------------- Problem List Details Patient Name: [name] Date of Service: 10/11/2018 11:00 AM Medical Record Number: [id number] Patient Account Number: [id number] Date of Birth/Sex: 1923-09-26 (84 y.o. F) Treating RN: Montey Hora Primary Care Provider: Grayland Ormond Other Clinician: Referring Provider: Grayland Ormond Treating Provider/Extender: Melburn Hake, HOYT Weeks in Treatment: 59 Active Problems ICD-10 Evaluated Encounter Code Description Active Date Today Diagnosis 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, stage 4 05/10/2018 No Yes L89.524 Pressure ulcer of left ankle, stage 4 04/25/2018 No Yes S90.822A Blister (nonthermal), left foot, initial encounter 10/11/2018 No Yes L89.622 Pressure ulcer of left heel, stage 2 10/11/2018 No Yes I70.233 Atherosclerosis of native arteries of right leg with ulceration of 08/10/2017 No Yes ankle I70.243 Atherosclerosis of native arteries of left leg with ulceration of 07/10/2018 No Yes ankle I89.0 Lymphedema, not elsewhere classified 08/10/2017 No Yes B35.4 Tinea corporis 09/28/2017 No Yes 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, left ankle and foot 08/14/2018 No Yes Inactive Problems Resolved Problems [name], [name] ([id number]) Electronic Signature(s) Signed: 10/11/2018 1:14:28 PM By: Worthy Keeler PA-C Entered By: Worthy Keeler on 10/11/2018 13:02:58 [name], [name] ([id number]) -------------------------------------------------------------------------------- Progress Note Details Patient Name: [name] Date of Service: 10/11/2018 11:00 AM Medical Record Number: [id number] Patient Account Number: [id number] Date of Birth/Sex: 1923-09-01 (84 y.o. F) Treating RN: Montey Hora Primary Care Provider: Grayland Ormond Other Clinician: Referring Provider: Grayland Ormond Treating Provider/Extender: Melburn Hake, HOYT Weeks in Treatment: 65 Subjective Chief Complaint Information obtained from Patient Patient is here for right lateral malleolus and left lateral malleolus ulcer History of Present Illness (HPI) 84 year old patient who most recently has been seeing both podiatry and vascular surgery for a long-standing ulcer of her right lateral malleolus which has been treated with various methodologies. Dr. Amalia Hailey the podiatrist saw her on 07/20/2017 and sent her to the wound center for possible hyperbaric oxygen therapy. past medical history of peripheral vascular disease, varicose veins, status post appendectomy, basal cell carcinoma excision from the left leg, cholecystectomy, pacemaker placement, right lower extremity angiography done by Dr. dew in March 2017 with placement of a stent. there is also note of a successful ablation of the right small saphenous vein done which was reviewed by ultrasound on 10/24/2016. the patient had a right small saphenous vein ablation done on 10/20/2016. The patient has never been a smoker. She has been seen by Dr. Corene Cornea dew the vascular surgeon who most recently saw her on 06/15/2017 for evaluation of ongoing problems with right leg swelling. She had a lower extremity arterial duplex examination done(02/13/17) which showed patent distal right superficial femoral artery stent and above-the-knee popliteal stent without evidence of  restenosis. The ABI was more than 1.3 on the right and more than 1.3 on the left. This was consistent with noncompressible arteries due to medial calcification. The right great toe pressure and PPG waveforms are within normal limits and the left great toe pressure and PPG waveforms are decreased. he recommended she continue to wear her compression stockings and continue with elevation. She is scheduled to have a noninvasive arterial study in the near future 08/16/2017 -- had a lower extremity arterial duplex examination done which showed patent distal right superficial femoral artery stent and above-the-knee popliteal stent without evidence of restenosis. The ABI was more than 1.3 on the right and more than 1.3 on the left. This was consistent with noncompressible arteries due to medial calcification. The right great toe pressure  and PPG waveforms are within normal limits and the left great toe pressure and PPG waveforms are decreased. the x-ray of the right ankle has not yet been done 08/24/2017 -- had a right ankle x-ray -- IMPRESSION:1. No fracture, bone lesion or evidence of osteomyelitis. 2. Lateral soft tissue swelling with a soft tissue ulcer. she has not yet seen the vascular surgeon for review 08/31/17 on evaluation today patient's wound appears to be showing signs of improvement. She still with her appointment with vascular in order to review her results of her vascular study and then determine if any intervention would be recommended at that time. No fevers, chills, nausea, or vomiting noted at this time. She has been tolerating the dressing changes without complication. 09/28/17 on evaluation today patient's wound appears to show signs of good improvement in regard to the granulation tissue which is surfacing. There is still a layer of slough covering the wound and the posterior portion is still significantly deeper than the anterior nonetheless there has been some good sign of things  moving towards the better. She is going to go back to Dr. dew for reevaluation to ensure her blood flow is still appropriate. That will be before her next evaluation with [place] next week. No fevers, chills, nausea, or vomiting noted at this time. Patient does have some discomfort rated to be a 3-4/10 depending on activity specifically cleansing the wound makes it worse. 10/05/2017 -- the patient was seen by Dr. Lucky Cowboy last week and noninvasive studies showed a normal right ABI with brisk ZAKAIYA, LARES. ([id number]) triphasic waveforms consistent with no arterial insufficiency including normal digital pressures. The duplex showed a patent distal right SFA stent and the proximal SFA was also normal. He was pleased with her test and thought she should have enough of perfusion for normal wound healing. He would see her back in 6 months time. 12/21/17 on evaluation today patient appears to be doing fairly well in regard to her right lateral ankle wound. Unfortunately the main issue that she is expansion at this point is that she is having some issues with what appears to be some cellulitis in the right anterior shin. She has also been noting a little bit of uncomfortable feeling especially last night and her ankle area. I'm afraid that she made the developing a little bit of an infection. With that being said I think it is in the early stages. 12/28/17 on evaluation today patient's ankle appears to be doing excellent. She's making good progress at this point the cellulitis seems to have improved after last week's evaluation. Overall she is having no significant discomfort which is excellent news. She does have an appointment with Dr. dew on March 29, 2018 for reevaluation in regard to the stent he placed. She seems to have excellent blood flow in the right lower extremity. 01/19/12 on evaluation today patient's wound appears to be doing very well. In fact she does not appear to require debridement at this  point, there's no evidence of infection, and overall from the standpoint of the wound she seems to be doing very well. With that being said I believe that it may be time to switch to different dressing away from the [facility] Dressing she tells me she does have a lot going on her friend actually passed away yesterday and she's also having a lot of issues with her husband this obviously is weighing heavy on her as far as your thoughts and concerns today. 01/25/18 on evaluation today  patient appears to be doing fairly well in regard to her right lateral malleolus. She has been tolerating the dressing changes without complication. Overall I feel like this is definitely showing signs of improvement as far as how the overall appearance of the wound is there's also evidence of epithelium start to migrate over the granulation tissue. In general I think that she is progressing nicely as far as the wound is concerned. The only concern she really has is whether or not we can switch to every other week visits in order to avoid having as many appointments as her daughters have a difficult time getting her to her appointments as well as the patient's husband to his he is not doing very well at this point. 02/22/18 on evaluation today patient's right lateral malleolus ulcer appears to be doing great. She has been tolerating the dressing changes without complication. Overall you making excellent progress at this time. Patient is having no significant discomfort. 03/15/18 on evaluation today patient appears to be doing much more poorly in regard to her right lateral ankle ulcer at this point. Unfortunately since have last seen her her husband has passed just a few days ago is obviously weighed heavily on her her daughter also had surgery well she is with her today as usual. There does not appear to be any evidence of infection she does seem to have significant contusion/deep tissue injury to the right lateral  malleolus which was not noted previous when I saw her last. It's hard to tell of exactly when this injury occurred although during the time she was spending the night in the hospital this may have been most likely. 03/22/18 on evaluation today patient appears to actually be doing very well in regard to her ulcer. She did unfortunately have a setback which was noted last week however the good news is we seem to be getting back on track and in fact the wound in the core did still have some necrotic tissue which will be addressed at this point today but in general I'm seeing signs that things are on the up and up. She is glad to hear this obviously she's been somewhat concerned that due to the how her wound digressed more recently. 03/29/18 on evaluation today patient appears to be doing fairly well in regard to her right lower extremity lateral malleolus ulcer. She unfortunately does have a new area of pressure injury over the inferior portion where the wound has opened up a little bit larger secondary to the pressure she seems to be getting. She does tell me sometimes when she sleeps at night that it actually hurts and does seem to be pushing on the area little bit more unfortunately. There does not appear to be any evidence of infection which is good news. She has been tolerating the dressing changes without complication. She also did have some bruising in the left second and third toes due to the fact that she may have bump this or injured it although she has neuropathy so she does not feel she did move recently that may have been where this came from. Nonetheless there does not appear to be any evidence of infection at this time. 04/12/18 on evaluation today patient's wound on the right lateral ankle actually appears to be doing a little bit better with a lot of necrotic docking tissue centrally loosening up in clearing away. However she does have the beginnings of a deep tissue injury on the left  lateral malleolus likely due  to the fact we've been trying offload the right as much as we have. I think she may benefit from an assistive soft device to help with offloading and it looks like they're looking at one of the doughnut conditions that wraps around the lower leg to offload which I think will definitely do a good job. With that being said I think we definitely need to address this issue on the left before it becomes a wound. Patient is not having significant pain. DONNIA, POPLASKI ([id number]) 04/19/18 on evaluation today patient appears to be doing excellent in regard to the progress she's made with her right lateral ankle ulcer. The left ankle region which did show evidence of a deep tissue injury seems to be resolving there's little fluid noted underneath and a blister there's nothing open at this point in time overall I feel like this is progressing nicely which is good news. She does not seem to be having significant discomfort at this point which is also good news. 04/25/18-She is here in follow up evaluation for bilateral lateral malleolar ulcers. The right lateral malleolus ulcer with pale subcutaneous tissue exposure, central area of ulcer with tendon/periosteum exposed. The left lateral malleolus ulcer now with central area of nonviable tissue, otherwise deep tissue injury. She is wearing compression wraps to the left lower extremity, she will place the right lower extremity compression wraps on when she gets home. She will be out of town over the weekend and return next week and follow-up appointment. She completed her doxycycline this morning 05/03/18 on evaluation today patient appears to be doing very well in regard to her right lateral ankle ulcer in general. At least she's showing some signs of improvement in this regard. Unfortunately she has some additional injury to the left lateral malleolus region which appears to be new likely even over the past several days. Again  this determination is based on the overall appearance. With that being said the patient is obviously frustrated about this currently. 05/10/18-She is here in follow-up evaluation for bilateral lateral malleolar ulcers. She states she has purchased offloading shoes/boots and they will arrive tomorrow. She was asked to bring them in the office at next week's appointment so her provider is aware of product being utilized. She continues to sleep on right or left side, she has been encouraged to sleep on her back. The right lateral malleolus ulcer is precariously close to peri-osteum; will order xray. The left lateral malleolus ulcer is improved. Will switch back to santyl; she will follow up next week. 05/17/18 on evaluation today patient actually appears to be doing very well in regard to her malleolus her ulcers compared to last time I saw them. She does not seem to have as much in the way of contusion at this point which is great news. With that being said she does continue to have discomfort and I do believe that she is still continuing to benefit from the offloading/pressure reducing boots that were recommended. I think this is the key to trying to get this to heal up completely. 05/24/18 on evaluation today patient actually appears to be doing worse at this point in time unfortunately compared to her last week's evaluation. She is having really no increased pain which is good news unfortunately she does have more maceration in your theme and noted surrounding the right lateral ankle the left lateral ankle is not really is erythematous I do not see signs of the overt cellulitis on that side. Unfortunately the wounds  do not seem to have shown any signs of improvement since the last evaluation. She also has significant swelling especially on the right compared to previous some of this may be due to infection however also think that she may be served better while she has these wounds by compression  wrapping versus continuing to use the Juxta-Lite for the time being. Especially with the amount of drainage that she is experiencing at this point. No fevers, chills, nausea, or vomiting noted at this time. 05/31/18 on evaluation today patient appears to actually be doing better in regard to her right lateral lower extremity ulcer specifically on the malleolus region. She has been tolerating the antibiotic without complication. With that being said she still continues to have issues but a little bit of redness although nothing like she what she was experiencing previous. She still continues to pressure to her ankle area she did get the problem on offloading boots unfortunately she will not wear them she states there too uncomfortable and she can't get in and out of the bed. Nonetheless at this point her wounds seem to be continually getting worse which is not what we want I'm getting somewhat concerned about her progress and how things are going to proceed if we do not intervene in some way shape or form. I therefore had a very lengthy conversation today about offloading yet again and even made a specific suggestion for switching her to a memory foam mattress and even gave the information for a specific one that they could look at getting if it was something that they were interested in considering. She does not want to be considered for a hospital bed air mattress although honestly insurance would not cover it that she does not have any wounds on her trunk. 06/14/18 on evaluation today both wounds over the bilateral lateral malleolus her ulcers appear to be doing better there's no evidence of pressure injury at this point. She did get the foam mattress for her bed and this does seem to have been extremely beneficial for her in my pinion. Her daughter states that she is having difficulty getting out of bed because of how soft it is. The patient also relates this to be. Nonetheless I do feel like she's  actually doing better. Unfortunately right after and around the time she was getting the mattress she also sustained a fall when she got up to go pick up the phone and ended up injuring her right elbow she has 18 sutures in place. We are not caring for this currently although home health is going to be taking the sutures out shortly. Nonetheless this may be something that we need to evaluate going forward. It depends on how well it has or has not healed in the end. She also recently saw an orthopedic specialist for an injection in the right shoulder just before her fall unfortunately the fall seems to have worsened her pain. 06/21/18 on evaluation today patient appears to be doing about the same in regard to her lateral malleolus ulcers. Both appear to be just a little bit deeper but again we are clinging away the necrotic and dead tissue which I think is why this is progressing towards a deeper realm as opposed to improving from my measurement standpoint in that regard. Nonetheless she has been tolerating the dressing changes she absolutely hates the memory foam mattress topper that was obtained for her nonetheless JOSHUA, ZERINGUE ([id number]) I do believe this is still doing excellent as far  as taking care of excess pressure in regard to the lateral malleolus regions. She in fact has no pressure injury that I see whereas in weeks past it was week by week I was constantly seeing new pressure injuries. Overall I think it has been very beneficial for her. 07/03/18; patient arrives in my clinic today. She has deep punched out areas over her bilateral lateral malleoli. The area on the right has some more depth. We spent a lot of time today talking about pressure relief for these areas. This started when her daughter asked for a prescription for a memory foam mattress. I have never written a prescription for a mattress and I don't think insurances would pay for that on an ordinary bed. In any case he  came up that she has foam boots that she refuses to wear. I would suggest going to these before any other offloading issues when she is in bed. They say she is meticulous about offloading this the rest of the day 07/10/18- She is seen in follow-up evaluation for bilateral, lateral malleolus ulcers. There is no improvement in the ulcers. She has purchased and is sleeping on a memory foam mattress/overlay, she has been using the offloading boots nightly over the past week. She has a follow up appointment with vascular medicine at the end of October, in my opinion this follow up should be expedited given her deterioration and suboptimal TBI results. We will order plain film xray of the left ankle as deeper structures are palpable; would consider having MRI, regardless of xray report(s). The ulcers will be treated with iodoflex/iodosorb, she is unable to safely change the dressings daily with santyl. 07/19/18 on evaluation today patient appears to be doing in general visually well in regard to her bilateral lateral malleolus ulcers. She has been tolerating the dressing changes without complication which is good news. With that being said we did have an x-ray performed on 07/12/18 which revealed a slight loosen see in the lateral portion of the distal left fibula which may represent artifact but underline lytic destruction or osteomyelitis could not be excluded. MRI was recommended. With that being said we can see about getting the patient scheduled for an MRI to further evaluate this area. In fact we have that scheduled currently for August 20 19,019. 07/26/18 on evaluation today patient's wound on the right lateral ankle actually appears to be doing fairly well at this point in my pinion. She has made some good progress currently. With that being said unfortunately in regard to the left lateral ankle ulcer this seems to be a little bit more problematic at this time. In fact as I further evaluated the  situation she actually had bone exposed which is the first time that's been the case in the bone appear to be necrotic. Currently I did review patient's note from Dr. Bunnie Domino office with [facility]. He stated that ABI was 1.26 on the right and 0.95 on the left with good waveforms. Her perfusion is stable not reduced from previous studies and her digital waveforms were pretty good particularly on the right. His conclusion upon review of the note was that there was not much she could do to improve her perfusion and he felt she was adequate for wound healing. His suggestion was that she continued to see [place] and consider a synthetic skin graft if there was no underlying infection. He plans to see her back in six months or as needed. 08/01/18 on evaluation today patient  appears to be doing better in regard to her right lateral ankle ulcer. Her left lateral ankle ulcer is about the same she still has bone involvement in evidence of necrosis. There does not appear to be evidence of infection at this time On the right lateral lower extremity. I have started her on the Augmentin she picked this up and started this yesterday. This is to get her through until she sees infectious disease which is scheduled for 08/12/18. 08/06/18 on evaluation today patient appears to be doing rather well considering my discussion with patient's daughter at the end of last week. The area which was marked where she had erythema seems to be improved and this is good news. With that being said overall the patient seems to be making good improvement when it comes to the overall appearance of the right lateral ankle ulcer although this has been slow she at least is coming around in this regard. Unfortunately in regard to the left lateral ankle ulcer this is osteomyelitis based on the pathology report as well is bone culture. Nonetheless we are still waiting CT scan. Unfortunately the MRI we originally ordered cannot  be performed as the patient is a pacemaker which I had overlooked. Nonetheless we are working on the CT scan approval and scheduling as of now. She did go to the hospital over the weekend and was placed on IV Cefzo for a couple of days. Fortunately this seems to have improved the erythema quite significantly which is good news. There does not appear to be any evidence of worsening infection at this time. She did have some bleeding after the last debridement therefore I did not perform any sharp debridement in regard to left lateral ankle at this point. Patient has been approved for a snap vac for the right lateral ankle. 08/14/18; the patient with wounds over her bilateral lateral malleoli. The area on the right actually looks quite good. Been using a snap back on this area. Healthy granulation and appears to be filling in. Unfortunately the area on the left is really problematic. 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 Also noted to have cellulitis. She saw Dr. Novella Olive of infectious disease today and was put on linezolid. We are able to verify this with her pharmacy. She is completed the Augmentin that she was already on. We've been using Iodoflex to this KELSA, JAWOROWSKI ([id number]) area 08/23/18 on evaluation today patient's wounds both actually appear to be doing better compared to my prior evaluations. Fortunately she showing signs of good improvement in regard to the overall wound status especially where were using the snap vac on the right. In regard to left lateral malleolus the wound bed actually appears to be much cleaner than previously noted. I do not feel any phone directly probed during evaluation today and though there is tendon noted this does not appear to be necrotic it's actually fairly good as far as the overall appearance of the tendon is concerned. In general the wound bed actually appears to  be doing significantly better than it was previous. Patient is currently in the care of [name] and I did review that note today. He actually has her on two weeks of linezolid and then following the patient will be on 1-2 months of Keflex. That is the plan currently. She has been on antibiotics therapy as prescribed by myself initially starting on July 30, 2018 and has  been on that continuously up to this point. 08/30/18 on evaluation today patient actually appears to be doing much better in regard to her right lateral malleolus ulcer. She has been tolerating the dressing changes specifically the snap vac without complication although she did have some issues with the seal currently. Apparently there was some trouble with getting it to maintain over the past week past Sunday. Nonetheless overall the wound appears better in regard to the right lateral malleolus region. In regard to left lateral malleolus this actually show some signs of additional granulation although there still tendon noted in the base of the wound this appears to be healthy not necrotic in any way whatsoever. We are considering potentially using a snap vac for the left lateral malleolus as well the product wrap from [employer], [place], was present in the clinic today we're going to see this patient I did have her come in with me after obtaining consent from the patient and her daughter in order to look at the wound and see if there's any recommendation one way or another as to whether or not they felt the snapback could be beneficial for the left lateral malleolus region. But the conclusion was that it might be but that this is definitely a little bit deeper wound than what traditionally would be utilized for a snap vac. 09/06/18 on evaluation today patient actually appears to be doing excellent in my pinion in regard to both ankle ulcers. She has been tolerating the dressing changes without complication which is great news.  Specifically we have been using the snap vac. In regard to the right ankle I'm not even sure that this is going to be necessary for today and following as the wound has filled in quite nicely. In regard to the left ankle I do believe that we're seeing excellent epithelialization from the edge as well as granulation in the central portion the tendon is still exposed but there's no evidence of necrotic bone and in general I feel like the patient has made excellent progress even compared to last week with just one week of the snap vac. 09/11/18; this is a patient who has wounds on her bilateral lateral malleoli. Initially both of these were deep stage IV wounds in the setting of chronic arterial insufficiency. She has been revascularized. As I understand think she been using snap vacs to both of these wounds however the area on the right became more superficial and currently she is only using it on the left. Using silver collagen on the right and silver collagen under the back on the left I believe 09/19/18 on evaluation today patient actually appears to be doing very well in regard to her lateral malleolus or ulcers bilaterally. She has been tolerating the dressing changes without complication. Fortunately there does not appear to be any evidence of infection at this time. Overall I feel like she is improving in an excellent manner and I'm very pleased with the fact that everything seems to be turning towards the better for her. This has obviously been a long road. 09/27/18 on evaluation today patient actually appears to be doing very well in regard to her bilateral lateral malleolus ulcers. She has been tolerating the dressing changes without complication. Fortunately there does not appear to be any evidence of infection at this time which is also great news. No fevers, chills, nausea, or vomiting noted at this time. Overall I feel like she is doing excellent with the snap vac on the left malleolus.  She had 40 mL of fluid collection over the past week. 10/04/18 on evaluation today patient actually appears to be doing well in regard to her bilateral lateral malleolus ulcers. She continues to tolerate the dressing changes without complication. One issue that I see is the snap vac on the left lateral malleolus which appears to have sealed off some fluid underlying this area and has not really allowed it to heal to the degree that I would like to see. For that reason I did suggest at this point we may want to pack a small piece of packing strip into this region to allow it to more effectively wick out fluid. 10/11/18 in general the patient today does not feel that she has been doing very well. She's been a little bit lethargic and subsequently is having bodyaches as well according to what she tells me today. With that being said overall she has been concerned with the fact that something may be worsening although to be honest her wounds really have not been appearing poorly. She does have a new ulcer on her left heel unfortunately. This may be pressure related. Nonetheless it seems to me to have potentially started at least as a blister I do not see any evidence of deep tissue injury. In regard to the left ankle the snap vac still seems to be causing the ceiling off of the deeper part of the wound which is in turn trapping fluid. I'm not extremely pleased with the overall appearance as far as progress from last week to this week therefore I'm gonna discontinue Birdsell, [name] ([id number]) the snap vac at this point. Patient History Information obtained from Patient. Family History Cancer - Father,Siblings, Heart Disease - Siblings, No family history of Diabetes, Hereditary Spherocytosis, Hypertension, Kidney Disease, Lung Disease, Seizures, Stroke, Thyroid Problems, Tuberculosis. Social History Never smoker, Marital Status - Married, Alcohol Use - Never, Drug Use - No History, Caffeine Use -  Rarely. Review of Systems (ROS) Constitutional Symptoms (General Health) Denies complaints or symptoms of Fever, Chills. Respiratory The patient has no complaints or symptoms. Cardiovascular The patient has no complaints or symptoms. Psychiatric The patient has no complaints or symptoms. Objective Constitutional Well-nourished and well-hydrated in no acute distress. Vitals Time Taken: 10:57 AM, Height: 65 in, Weight: 154.3 lbs, BMI: 25.7, Temperature: 98.3 F, Pulse: 75 bpm, Respiratory Rate: 16 breaths/min, Blood Pressure: 111/50 mmHg. Respiratory normal breathing without difficulty. Psychiatric this patient is able to make decisions and demonstrates good insight into disease process. Alert and Oriented x 3. pleasant and cooperative. General Notes: Patient's wounds at all three locations did require sharp debridement today. She actually tolerated this without complication post debridement the wound bed appears to be much better at all locations. I'm still concerned about the fact that the left lateral ankle ulcer is much deeper than what I would really like to see. Nonetheless we're gonna have to keep an eye on this if anything seems to be changing of worsening we may have to get her back to see infectious disease as to whether or not she requires additional antibiotic therapy. Integumentary (Hair, Skin) Wound #1 status is Open. Original cause of wound was Gradually Appeared. The wound is located on the Right,Lateral Malleolus. The wound measures 0.6cm length x 0.4cm width x 0.2cm depth; 0.188cm^2 area and 0.038cm^3 volume. There is Fat Layer (Subcutaneous Tissue) Exposed exposed. There is no tunneling or undermining noted. There is a medium Devers, [name] ([id number]) amount of serous drainage noted. The wound  margin is distinct with the outline attached to the wound base. There is small (1-33%) pink granulation within the wound bed. There is a medium (34-66%) amount of necrotic  tissue within the wound bed including Adherent Slough. The periwound skin appearance exhibited: Rubor. The periwound skin appearance did not exhibit: Callus, Crepitus, Excoriation, Induration, Rash, Scarring, Dry/Scaly, Maceration, Atrophie Blanche, Cyanosis, Ecchymosis, Hemosiderin Staining, Mottled, Pallor, Erythema. Periwound temperature was noted as No Abnormality. The periwound has tenderness on palpation. Wound #2 status is Open. Original cause of wound was Gradually Appeared. The wound is located on the Left,Lateral Malleolus. The wound measures 1.4cm length x 1cm width x 0.6cm depth; 1.1cm^2 area and 0.66cm^3 volume. There is Fat Layer (Subcutaneous Tissue) Exposed exposed. There is undermining starting at 4:00 and ending at 8:00 with a maximum distance of 0.2cm. There is a medium amount of serosanguineous drainage noted. The wound margin is epibole. There is large (67-100%) pink, pale granulation within the wound bed. There is a small (1-33%) amount of necrotic tissue within the wound bed including Adherent Slough. The periwound skin appearance exhibited: Maceration. The periwound skin appearance did not exhibit: Callus, Crepitus, Excoriation, Induration, Rash, Scarring, Dry/Scaly, Atrophie Blanche, Cyanosis, Ecchymosis, Hemosiderin Staining, Mottled, Pallor, Rubor, Erythema. Periwound temperature was noted as No Abnormality. The periwound has tenderness on palpation. Wound #3 status is Open. Original cause of wound was Gradually Appeared. The wound is located on the Left Calcaneus. The wound measures 1.4cm length x 2.5cm width x 0.2cm depth; 2.749cm^2 area and 0.55cm^3 volume. The wound is limited to skin breakdown. There is no tunneling or undermining noted. There is a large amount of serous drainage noted. The wound margin is indistinct and nonvisible. There is no granulation within the wound bed. There is no necrotic tissue within the wound bed. The periwound skin appearance exhibited:  Maceration. The periwound skin appearance did not exhibit: Callus, Crepitus, Excoriation, Induration, Rash, Scarring, Dry/Scaly, Atrophie Blanche, Cyanosis, Ecchymosis, Hemosiderin Staining, Mottled, Pallor, Rubor, Erythema. 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 There was a Excisional Skin/Subcutaneous Tissue Debridement with a total area of 0.24 sq cm performed by STONE III, HOYT E., PA-C. With the following instrument(s): Curette to remove Viable and Non-Viable tissue/material. Material removed includes Subcutaneous Tissue, Slough, and Fibrin/Exudate after achieving pain control using Lidocaine 4% Topical Solution. No specimens were taken. A time out was conducted at 11:30, prior to the start of the procedure. A Minimum amount of bleeding was controlled with Pressure. The procedure was tolerated well with a pain level of 0 throughout and a pain level of 0 following the procedure. Post Debridement Measurements: 0.6cm length x 0.4cm width x JOEI, FRANGOS. ([id number]) 0.3cm depth; 0.057cm^3 volume. Post debridement Stage noted as Category/Stage IV. Character of Wound/Ulcer Post Debridement is improved. Severity of Tissue Post Debridement is: Fat layer exposed. Post procedure Diagnosis Wound #1: Same as Pre-Procedure Wound #2 Pre-procedure diagnosis of Wound #2 is a Pressure Ulcer located on the Left,Lateral  Malleolus .Severity of Tissue Pre Debridement is:  Fat layer exposed. There was a Excisional Skin/Subcutaneous Tissue Debridement with a total area of 1.4 sq cm performed by STONE III, HOYT E., PA-C. With the following instrument(s): Curette to remove Viable and Non-Viable tissue/material. Material removed includes Subcutaneous Tissue, Slough, and Fibrin/Exudate after achieving pain control using Lidocaine 4% Topical Solution. No specimens were taken. A time out was conducted at 11:36, prior to the start of the procedure. A Minimum amount of bleeding was controlled with Pressure. The procedure was tolerated well with a pain level of 0 throughout and a pain level of 0 following the procedure. Post Debridement Measurements: 1.4cm length x 1cm width x 0.8cm depth; 0.88cm^3 volume. Post debridement Stage noted as Category/Stage IV. Character of Wound/Ulcer Post Debridement is improved. Severity of Tissue Post Debridement is: Fat layer exposed. Post procedure Diagnosis Wound #2: Same as Pre-Procedure Wound #3 Pre-procedure diagnosis of Wound #3 is a Pressure Ulcer located on the Left Calcaneus . There was a Selective/Open Wound Skin/Epidermis Debridement with a total area of 3.5 sq cm performed by STONE III, HOYT E., PA-C. With the following instrument(s): Curette, Forceps, and Scissors to remove Non-Viable tissue/material. Material removed includes Skin: Dermis and Skin: Epidermis and after achieving pain control using Lidocaine 4% Topical Solution. No specimens were taken. A time out was conducted at 11:32, prior to the start of the procedure. A Minimum amount of bleeding was controlled with Pressure. The procedure was tolerated well with a pain level of 0 throughout and a pain level of 0 following the procedure. Post Debridement Measurements: 1.4cm length x 2.5cm width x 0.1cm depth; 0.275cm^3 volume. Post debridement Stage noted as Category/Stage II. Character of Wound/Ulcer Post Debridement is  improved. Post procedure Diagnosis Wound #3: Same as Pre-Procedure Plan Wound Cleansing: Wound #1 Right,Lateral Malleolus: Clean wound with Normal Saline. Cleanse wound with mild soap and water May Shower, gently pat wound dry prior to applying new dressing. Wound #2 Left,Lateral Malleolus: Clean wound with Normal Saline. Cleanse wound with mild soap and water May Shower, gently pat wound dry prior to applying new dressing. Wound #3 Left Calcaneus: Clean wound with Normal Saline. Cleanse wound with mild soap and water May Shower, gently pat wound dry prior to applying new dressing. Anesthetic (add to Medication List): Wound #1 Right,Lateral Malleolus: Topical Lidocaine 4% cream applied to wound bed prior to debridement (In Clinic Only). Wound #2 Left,Lateral Malleolus: Topical Lidocaine 4% cream applied to wound bed prior to debridement (In Clinic Only). Wound #3 Left Calcaneus: Topical Lidocaine 4% cream applied to wound bed prior to debridement (In Clinic Only). Primary Wound Dressing: Wound #1 Right,Lateral Malleolus: SAVANA, SPINA ([id number]) Silver Collagen Wound #2 Left,Lateral Malleolus: Silver Alginate Wound #3 Left Calcaneus: Other: - antibiotic ointment Secondary Dressing: Wound #1 Right,Lateral Malleolus: Boardered Foam Dressing Wound #2 Left,Lateral Malleolus: Boardered Foam Dressing Wound #3 Left Calcaneus: Boardered Foam Dressing Dressing Change Frequency: Wound #1 Right,Lateral Malleolus: Change Dressing Monday, Wednesday, Friday Wound #2 Left,Lateral Malleolus: Change Dressing Monday, Wednesday, Friday Wound #3 Left Calcaneus: Change Dressing Monday, Wednesday, Friday Follow-up Appointments: Wound #1 Right,Lateral Malleolus: Return Appointment in 1 week. Wound #2 Left,Lateral Malleolus: Return Appointment in 1 week. Wound #3 Left Calcaneus: Return Appointment in 1 week. Edema Control: Wound #1 Right,Lateral Malleolus: Patient to wear own  compression stockings Patient to wear own Velcro compression garment. Elevate legs to the level of the heart and pump ankles as often as possible Wound #2 Left,Lateral Malleolus: Patient to wear own compression stockings Patient to wear own Velcro  compression garment. Elevate legs to the level of the heart and pump ankles as often as possible Off-Loading: Wound #1 Right,Lateral Malleolus: Turn and reposition every 2 hours Other: - do not put pressure on your ankles Wound #2 Left,Lateral Malleolus: Turn and reposition every 2 hours Other: - do not put pressure on your ankles Additional Orders / Instructions: Wound #1 Right,Lateral Malleolus: Increase protein intake. Wound #2 Left,Lateral Malleolus: Increase protein intake. Home Health: Wound #1 Right,Lateral Malleolus: [place] Visits - [place] Nurse may visit PRN to address patient s wound care needs. FACE TO FACE ENCOUNTER: MEDICARE and MEDICAID PATIENTS: I certify that this patient is under my care and that I had a face-to-face encounter that meets the physician face-to-face encounter requirements with this patient on this date. The encounter with the patient was in whole or in part for the following MEDICAL CONDITION: (primary reason for [place]) MEDICAL NECESSITY: I certify, that based on my findings, NURSING services are a medically necessary home health service. HOME BOUND STATUS: I certify that my clinical findings support that this patient is homebound (i.e., Due to illness or injury, pt requires aid of supportive devices such as crutches, cane, wheelchairs, walkers, the use of special transportation or the assistance of another person to leave their place of residence. There is a normal inability to leave the home and doing so requires considerable and taxing effort. Other absences are for medical reasons / religious services and are infrequent or of short duration when for other  reasons). PENNELOPE, BASQUE ([id number]) If current dressing causes regression in wound condition, may D/C ordered dressing product/s and apply Normal Saline Moist Dressing daily until next [place] / Other MD appointment. [place] of regression in wound condition at [phone]. Please direct any NON-WOUND related issues/requests for orders to patient's Primary Care Physician Wound #2 Left,Lateral Malleolus: [place] Visits - [place] Nurse may visit PRN to address patient s wound care needs. FACE TO FACE ENCOUNTER: MEDICARE and MEDICAID PATIENTS: I certify that this patient is under my care and that I had a face-to-face encounter that meets the physician face-to-face encounter requirements with this patient on this date. The encounter with the patient was in whole or in part for the following MEDICAL CONDITION: (primary reason for [place]) MEDICAL NECESSITY: I certify, that based on my findings, NURSING services are a medically necessary home health service. HOME BOUND STATUS: I certify that my clinical findings support that this patient is homebound (i.e., Due to illness or injury, pt requires aid of supportive devices such as crutches, cane, wheelchairs, walkers, the use of special transportation or the assistance of another person to leave their place of residence. There is a normal inability to leave the home and doing so requires considerable and taxing effort. Other absences are for medical reasons / religious services and are infrequent or of short duration when for other reasons). If current dressing causes regression in wound condition, may D/C ordered dressing product/s and apply Normal Saline Moist Dressing daily until next [place] / Other MD appointment. [place] of regression in wound condition at [phone]. Please direct any NON-WOUND related issues/requests for orders to patient's  Primary Care Physician Wound #3 Left Calcaneus: [place] Visits - [place] Nurse may visit PRN to address patient s wound care needs. FACE TO FACE ENCOUNTER: MEDICARE and MEDICAID PATIENTS: I certify that this patient is under my care  and that I had a face-to-face encounter that meets the physician face-to-face encounter requirements with this patient on this date. The encounter with the patient was in whole or in part for the following MEDICAL CONDITION: (primary reason for [place]) MEDICAL NECESSITY: I certify, that based on my findings, NURSING services are a medically necessary home health service. HOME BOUND STATUS: I certify that my clinical findings support that this patient is homebound (i.e., Due to illness or injury, pt requires aid of supportive devices such as crutches, cane, wheelchairs, walkers, the use of special transportation or the assistance of another person to leave their place of residence. There is a normal inability to leave the home and doing so requires considerable and taxing effort. Other absences are for medical reasons / religious services and are infrequent or of short duration when for other reasons). If current dressing causes regression in wound condition, may D/C ordered dressing product/s and apply Normal Saline Moist Dressing daily until next [place] / Other MD appointment. [place] of regression in wound condition at [phone]. Please direct any NON-WOUND related issues/requests for orders to patient's Primary Care Physician Negative Pressure Wound Therapy: Wound #2 Left,Lateral Malleolus: Place NPWT on HOLD. - Hold SNAP VAC for this week I'm gonna recommend at this point that we continue with the above wound care measures for the next week. The patient is in agreement with the plan. If anything changes or worsens in the meantime shall contact the office and let me know otherwise we will see  were things stand at follow-up. Please see above for specific wound care orders. We will see patient for re-evaluation in 1 week(s) here in the clinic. If anything worsens or changes patient will contact our office for additional recommendations. Electronic Signature(s) Signed: 10/11/2018 1:14:28 PM By: Worthy Keeler PA-C Entered By: Worthy Keeler on 10/11/2018 13:05:24 Bonura, [name] ([id number]) -------------------------------------------------------------------------------- ROS/PFSH Details Patient Name: [name] Date of Service: 10/11/2018 11:00 AM Medical Record Number: [id number] Patient Account Number: [id number] Date of Birth/Sex: 1923-02-05 (84 y.o. F) Treating RN: Montey Hora Primary Care Provider: Grayland Ormond Other Clinician: Referring Provider: Grayland Ormond Treating Provider/Extender: Melburn Hake, HOYT Weeks in Treatment: 84 Information Obtained From Patient Wound History Do you currently have one or more open woundso Yes How many open wounds do you currently haveo 1 Approximately how long have you had your woundso 2 yrs How have you been treating your wound(s) until nowo mupirocin, soaking in epsom salt Has your wound(s) ever healed and then re-openedo No Have you had any lab work done in the past montho No Have you tested positive for an antibiotic resistant organism (MRSA, VRE)o No Have you tested positive for osteomyelitis (bone infection)o No Have you had any tests for circulation on your legso Yes Who ordered the testo Dr. Lucky Cowboy Where was the test doneo [facility] Constitutional Symptoms (General Health) Complaints and Symptoms: Negative for: Fever; Chills Eyes Medical History: Positive for: Cataracts - surgery Respiratory Complaints and Symptoms: No Complaints or Symptoms Cardiovascular Complaints and Symptoms: No Complaints or Symptoms Medical History: Positive for: Congestive Heart Failure; Hypertension Musculoskeletal Medical  History: Positive for: Osteoarthritis Neurologic Medical History: Positive for: Neuropathy Oncologic SAMYRIA, RUDIE ([id number]) Medical History: Negative for: Received Chemotherapy; Received Radiation Psychiatric Complaints and Symptoms: No Complaints or Symptoms HBO Extended History Items Eyes: Cataracts Immunizations Pneumococcal Vaccine: Received Pneumococcal Vaccination: Yes Implantable Devices Family and Social History Cancer: Yes - Father,Siblings; Diabetes: No; Heart Disease: Yes -  Siblings; Hereditary Spherocytosis: No; Hypertension: No; Kidney Disease: No; Lung Disease: No; Seizures: No; Stroke: No; Thyroid Problems: No; Tuberculosis: No; Never smoker; Marital Status - Married; Alcohol Use: Never; Drug Use: No History; Caffeine Use: Rarely; Financial Concerns: No; Food, Clothing or Shelter Needs: No; Support System Lacking: No; Transportation Concerns: No; Advanced Directives: No; Patient does not want information on Advanced Directives; Do not resuscitate: No; Living Will: Yes (Not Provided); Medical Power of Attorney: No Physician Affirmation I have reviewed and agree with the above information. Electronic Signature(s) Signed: 10/11/2018 1:14:28 PM By: Worthy Keeler PA-C Signed: 10/11/2018 5:34:16 PM By: Montey Hora Entered By: Worthy Keeler on 10/11/2018 13:04:33 Julius, [name] ([id number]) -------------------------------------------------------------------------------- SuperBill Details Patient Name: [name] Date of Service: 10/11/2018 Medical Record Number: [id number] Patient Account Number: [id number] Date of Birth/Sex: June 28, 1923 (84 y.o. 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 I70.243 Atherosclerosis of native arteries of left leg with ulceration of ankle I89.0 Lymphedema, not elsewhere classified B35.4 Tinea corporis 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, left ankle and foot Facility Procedures CPT4 Code: 02542706 Description: 23762 - DEB SUBQ TISSUE 20 SQ CM/< ICD-10 Diagnosis Description 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, stage 4 L89.524 Pressure ulcer of left ankle, stage 4 Modifier: Quantity: 1 CPT4 Code: 83151761 Description: 60737 - DEBRIDE WOUND 1ST 20 SQ CM OR < ICD-10 Diagnosis Description L89.622 Pressure ulcer of left heel, stage 2 Modifier: Quantity: 1 Physician Procedures CPT4 Code: 1062694 Description: 85462 - WC PHYS SUBQ TISS 20 SQ CM ICD-10 Diagnosis Description 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, stage 4 L89.524 Pressure ulcer of left ankle, stage 4 Modifier: Quantity: 1 CPT4 Code: [id number] Description: 38182 - WC PHYS DEBR WO ANESTH 20 SQ CM ICD-10 Diagnosis Description L89.622 Pressure ulcer of left heel, stage 2 Modifier: Quantity: 1 Electronic Signature(s) Signed: 10/11/2018 1:14:28 PM By: Sherene Sires, [name] ([id number]) Entered By: Worthy Keeler on 10/11/2018 13:05:47

## 2018-10-18 ENCOUNTER — Encounter: Payer: Medicare Other | Admitting: Physician Assistant

## 2018-10-18 DIAGNOSIS — I70243 Atherosclerosis of native arteries of left leg with ulceration of ankle: Secondary | ICD-10-CM | POA: Diagnosis not present

## 2018-10-19 NOTE — Progress Notes (Signed)
KEARA, PAGLIARULO ([id number]) Visit Report for 10/18/2018 Chief Complaint Document Details Patient Name: [name], [name]. Date of Service: 10/18/2018 12:30 PM Medical Record Number: [id number] Patient Account Number: [id number] Date of Birth/Sex: 1923-11-09 (84 y.o. F) Treating RN: Montey Hora Primary Care Provider: Grayland Ormond Other Clinician: Referring Provider: Grayland Ormond Treating Provider/Extender: Melburn Hake, HOYT Weeks in Treatment: 21 Information Obtained from: Patient Chief Complaint Patient is here for right lateral malleolus and left lateral malleolus ulcer Electronic Signature(s) Signed: 10/18/2018 2:24:17 PM By: Worthy Keeler PA-C Entered By: Worthy Keeler on 10/18/2018 13:01:53 Siebenaler, [name] ([id number]) -------------------------------------------------------------------------------- HPI Details Patient Name: [name] Date of Service: 10/18/2018 12:30 PM Medical Record Number: [id number] Patient Account Number: [id number] Date of Birth/Sex: 1923/12/01 (84 y.o. 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 Dr. Amalia Hailey the podiatrist saw her on 07/20/2017 and sent her to the wound center for possible hyperbaric oxygen therapy. past medical history of peripheral vascular disease, varicose veins, status post appendectomy, basal cell carcinoma excision from the left leg, cholecystectomy, pacemaker placement, right lower extremity angiography done by Dr. dew in March 2017 with placement of a stent. there is also note of a successful ablation of the right small  saphenous vein done which was reviewed by ultrasound on 10/24/2016. the patient had a right small saphenous vein ablation done on 10/20/2016. The patient has never been a smoker. She has been seen by Dr. Corene Cornea dew the vascular surgeon who most recently saw her on 06/15/2017 for evaluation of ongoing problems with right leg swelling. She had a lower extremity arterial duplex examination done(02/13/17) which showed patent distal right superficial femoral artery stent and above-the-knee popliteal stent without evidence of restenosis. The ABI was more than 1.3 on the right and more than 1.3 on the left. This was consistent with noncompressible arteries due to medial calcification. The right great toe pressure and PPG waveforms are within normal limits and the left great toe pressure and PPG waveforms are decreased. he recommended she continue to wear her compression stockings and continue with elevation. She is scheduled to have a noninvasive arterial study in the near future 08/16/2017 -- had a lower extremity arterial duplex examination done which showed patent distal right superficial femoral artery stent and above-the-knee popliteal stent without evidence of restenosis. The ABI was more than 1.3 on the right and more than 1.3 on the left. This was consistent with noncompressible arteries due to medial calcification. The right great toe pressure and PPG waveforms are within normal limits and the left great toe pressure and PPG waveforms are decreased. the x-ray of the right ankle has not yet been done 08/24/2017 -- had a right ankle x-ray -- IMPRESSION:1. No fracture, bone lesion or evidence of osteomyelitis. 2. Lateral soft tissue swelling with a soft tissue ulcer. she has not yet seen the vascular surgeon for review 08/31/17 on evaluation today patient's wound appears to be showing signs of improvement. She still with her appointment with vascular in order to review her results of her vascular  study and then determine if any intervention would be recommended at that time. No fevers, chills, nausea, or vomiting noted at this time. She has  been tolerating the dressing changes without complication. 09/28/17 on evaluation today patient's wound appears to show signs of good improvement in regard to the granulation tissue which is surfacing. There is still a layer of slough covering the wound and the posterior portion is still significantly deeper than the anterior nonetheless there has been some good sign of things moving towards the better. She is going to go back to Dr. dew for reevaluation to ensure her blood flow is still appropriate. That will be before her next evaluation with [place] next week. No fevers, chills, nausea, or vomiting noted at this time. Patient does have some discomfort rated to be a 3-4/10 depending on activity specifically cleansing the wound makes it worse. 10/05/2017 -- the patient was seen by Dr. Lucky Cowboy last week and noninvasive studies showed a normal right ABI with brisk triphasic waveforms consistent with no arterial insufficiency including normal digital pressures. The duplex showed a patent distal right SFA stent and the proximal SFA was also normal. He was pleased with her test and thought she should have enough of perfusion for normal wound healing. He would see her back in 6 months time. 12/21/17 on evaluation today patient appears to be doing fairly well in regard to her right lateral ankle wound. Unfortunately the main issue that she is expansion at this point is that she is having some issues with what appears to be some cellulitis in the MADELYNN, [name]. ([id number]) right anterior shin. She has also been noting a little bit of uncomfortable feeling especially last night and her ankle area. I'm afraid that she made the developing a little bit of an infection. With that being said I think it is in the early stages. 12/28/17 on evaluation today patient's ankle  appears to be doing excellent. She's making good progress at this point the cellulitis seems to have improved after last week's evaluation. Overall she is having no significant discomfort which is excellent news. She does have an appointment with Dr. dew on March 29, 2018 for reevaluation in regard to the stent he placed. She seems to have excellent blood flow in the right lower extremity. 01/19/12 on evaluation today patient's wound appears to be doing very well. In fact she does not appear to require debridement at this point, there's no evidence of infection, and overall from the standpoint of the wound she seems to be doing very well. With that being said I believe that it may be time to switch to different dressing away from the [facility] Dressing she tells me she does have a lot going on her friend actually passed away yesterday and she's also having a lot of issues with her husband this obviously is weighing heavy on her as far as your thoughts and concerns today. 01/25/18 on evaluation today patient appears to be doing fairly well in regard to her right lateral malleolus. She has been tolerating the dressing changes without complication. Overall I feel like this is definitely showing signs of improvement as far as how the overall appearance of the wound is there's also evidence of epithelium start to migrate over the granulation tissue. In general I think that she is progressing nicely as far as the wound is concerned. The only concern she really has is whether or not we can switch to every other week visits in order to avoid having as many appointments as her daughters have a difficult time getting her to her appointments as well as the patient's husband to his he is  not doing very well at this point. 02/22/18 on evaluation today patient's right lateral malleolus ulcer appears to be doing great. She has been tolerating the dressing changes without complication. Overall you making excellent  progress at this time. Patient is having no significant discomfort. 03/15/18 on evaluation today patient appears to be doing much more poorly in regard to her right lateral ankle ulcer at this point. Unfortunately since have last seen her her husband has passed just a few days ago is obviously weighed heavily on her her daughter also had surgery well she is with her today as usual. There does not appear to be any evidence of infection she does seem to have significant contusion/deep tissue injury to the right lateral malleolus which was not noted previous when I saw her last. It's hard to tell of exactly when this injury occurred although during the time she was spending the night in the hospital this may have been most likely. 03/22/18 on evaluation today patient appears to actually be doing very well in regard to her ulcer. She did unfortunately have a setback which was noted last week however the good news is we seem to be getting back on track and in fact the wound in the core did still have some necrotic tissue which will be addressed at this point today but in general I'm seeing signs that things are on the up and up. She is glad to hear this obviously she's been somewhat concerned that due to the how her wound digressed more recently. 03/29/18 on evaluation today patient appears to be doing fairly well in regard to her right lower extremity lateral malleolus ulcer. She unfortunately does have a new area of pressure injury over the inferior portion where the wound has opened up a little bit larger secondary to the pressure she seems to be getting. She does tell me sometimes when she sleeps at night that it actually hurts and does seem to be pushing on the area little bit more unfortunately. There does not appear to be any evidence of infection which is good news. She has been tolerating the dressing changes without complication. She also did have some bruising in the left second and third toes  due to the fact that she may have bump this or injured it although she has neuropathy so she does not feel she did move recently that may have been where this came from. Nonetheless there does not appear to be any evidence of infection at this time. 04/12/18 on evaluation today patient's wound on the right lateral ankle actually appears to be doing a little bit better with a lot of necrotic docking tissue centrally loosening up in clearing away. However she does have the beginnings of a deep tissue injury on the left lateral malleolus likely due to the fact we've been trying offload the right as much as we have. I think she may benefit from an assistive soft device to help with offloading and it looks like they're looking at one of the doughnut conditions that wraps around the lower leg to offload which I think will definitely do a good job. With that being said I think we definitely need to address this issue on the left before it becomes a wound. Patient is not having significant pain. 04/19/18 on evaluation today patient appears to be doing excellent in regard to the progress she's made with her right lateral ankle ulcer. The left ankle region which did show evidence of a deep tissue injury seems  to be resolving there's little fluid noted underneath and a blister there's nothing open at this point in time overall I feel like this is progressing nicely which is good news. She does not seem to be having significant discomfort at this point which is also good news. 04/25/18-She is here in follow up evaluation for bilateral lateral malleolar ulcers. The right lateral malleolus ulcer with pale subcutaneous tissue exposure, central area of ulcer with tendon/periosteum exposed. The left lateral malleolus ulcer now with Hussein, [name] ([id number]) central area of nonviable tissue, otherwise deep tissue injury. She is wearing compression wraps to the left lower extremity, she will place the right lower  extremity compression wraps on when she gets home. She will be out of town over the weekend and return next week and follow-up appointment. She completed her doxycycline this morning 05/03/18 on evaluation today patient appears to be doing very well in regard to her right lateral ankle ulcer in general. At least she's showing some signs of improvement in this regard. Unfortunately she has some additional injury to the left lateral malleolus region which appears to be new likely even over the past several days. Again this determination is based on the overall appearance. With that being said the patient is obviously frustrated about this currently. 05/10/18-She is here in follow-up evaluation for bilateral lateral malleolar ulcers. She states she has purchased offloading shoes/boots and they will arrive tomorrow. She was asked to bring them in the office at next week's appointment so her provider is aware of product being utilized. She continues to sleep on right or left side, she has been encouraged to sleep on her back. The right lateral malleolus ulcer is precariously close to peri-osteum; will order xray. The left lateral malleolus ulcer is improved. Will switch back to santyl; she will follow up next week. 05/17/18 on evaluation today patient actually appears to be doing very well in regard to her malleolus her ulcers compared to last time I saw them. She does not seem to have as much in the way of contusion at this point which is great news. With that being said she does continue to have discomfort and I do believe that she is still continuing to benefit from the offloading/pressure reducing boots that were recommended. I think this is the key to trying to get this to heal up completely. 05/24/18 on evaluation today patient actually appears to be doing worse at this point in time unfortunately compared to her last week's evaluation. She is having really no increased pain which is good news  unfortunately she does have more maceration in your theme and noted surrounding the right lateral ankle the left lateral ankle is not really is erythematous I do not see signs of the overt cellulitis on that side. Unfortunately the wounds do not seem to have shown any signs of improvement since the last evaluation. She also has significant swelling especially on the right compared to previous some of this may be due to infection however also think that she may be served better while she has these wounds by compression wrapping versus continuing to use the Juxta-Lite for the time being. Especially with the amount of drainage that she is experiencing at this point. No fevers, chills, nausea, or vomiting noted at this time. 05/31/18 on evaluation today patient appears to actually be doing better in regard to her right lateral lower extremity ulcer specifically on the malleolus region. She has been tolerating the antibiotic without complication. With that being  said she still continues to have issues but a little bit of redness although nothing like she what she was experiencing previous. She still continues to pressure to her ankle area she did get the problem on offloading boots unfortunately she will not wear them she states there too uncomfortable and she can't get in and out of the bed. Nonetheless at this point her wounds seem to be continually getting worse which is not what we want I'm getting somewhat concerned about her progress and how things are going to proceed if we do not intervene in some way shape or form. I therefore had a very lengthy conversation today about offloading yet again and even made a specific suggestion for switching her to a memory foam mattress and even gave the information for a specific one that they could look at getting if it was something that they were interested in considering. She does not want to be considered for a hospital bed air mattress although honestly  insurance would not cover it that she does not have any wounds on her trunk. 06/14/18 on evaluation today both wounds over the bilateral lateral malleolus her ulcers appear to be doing better there's no evidence of pressure injury at this point. She did get the foam mattress for her bed and this does seem to have been extremely beneficial for her in my pinion. Her daughter states that she is having difficulty getting out of bed because of how soft it is. The patient also relates this to be. Nonetheless I do feel like she's actually doing better. Unfortunately right after and around the time she was getting the mattress she also sustained a fall when she got up to go pick up the phone and ended up injuring her right elbow she has 18 sutures in place. We are not caring for this currently although home health is going to be taking the sutures out shortly. Nonetheless this may be something that we need to evaluate going forward. It depends on how well it has or has not healed in the end. She also recently saw an orthopedic specialist for an injection in the right shoulder just before her fall unfortunately the fall seems to have worsened her pain. 06/21/18 on evaluation today patient appears to be doing about the same in regard to her lateral malleolus ulcers. Both appear to be just a little bit deeper but again we are clinging away the necrotic and dead tissue which I think is why this is progressing towards a deeper realm as opposed to improving from my measurement standpoint in that regard. Nonetheless she has been tolerating the dressing changes she absolutely hates the memory foam mattress topper that was obtained for her nonetheless I do believe this is still doing excellent as far as taking care of excess pressure in regard to the lateral malleolus regions. She in fact has no pressure injury that I see whereas in weeks past it was week by week I was constantly seeing new pressure injuries. Overall  I think it has been very beneficial for her. 07/03/18; patient arrives in my clinic today. She has deep punched out areas over her bilateral lateral malleoli. The area on the right has some more depth. LATRECIA, CAPITO ([id number]) We spent a lot of time today talking about pressure relief for these areas. This started when her daughter asked for a prescription for a memory foam mattress. I have never written a prescription for a mattress and I don't think insurances would pay  for that on an ordinary bed. In any case he came up that she has foam boots that she refuses to wear. I would suggest going to these before any other offloading issues when she is in bed. They say she is meticulous about offloading this the rest of the day 07/10/18- She is seen in follow-up evaluation for bilateral, lateral malleolus ulcers. There is no improvement in the ulcers. She has purchased and is sleeping on a memory foam mattress/overlay, she has been using the offloading boots nightly over the past week. She has a follow up appointment with vascular medicine at the end of October, in my opinion this follow up should be expedited given her deterioration and suboptimal TBI results. We will order plain film xray of the left ankle as deeper structures are palpable; would consider having MRI, regardless of xray report(s). The ulcers will be treated with iodoflex/iodosorb, she is unable to safely change the dressings daily with santyl. 07/19/18 on evaluation today patient appears to be doing in general visually well in regard to her bilateral lateral malleolus ulcers. She has been tolerating the dressing changes without complication which is good news. With that being said we did have an x-ray performed on 07/12/18 which revealed a slight loosen see in the lateral portion of the distal left fibula which may represent artifact but underline lytic destruction or osteomyelitis could not be excluded. MRI was recommended. With  that being said we can see about getting the patient scheduled for an MRI to further evaluate this area. In fact we have that scheduled currently for August 20 19,019. 07/26/18 on evaluation today patient's wound on the right lateral ankle actually appears to be doing fairly well at this point in my pinion. She has made some good progress currently. With that being said unfortunately in regard to the left lateral ankle ulcer this seems to be a little bit more problematic at this time. In fact as I further evaluated the situation she actually had bone exposed which is the first time that's been the case in the bone appear to be necrotic. Currently I did review patient's note from Dr. Bunnie Domino office with [facility]. He stated that ABI was 1.26 on the right and 0.95 on the left with good waveforms. Her perfusion is stable not reduced from previous studies and her digital waveforms were pretty good particularly on the right. His conclusion upon review of the note was that there was not much she could do to improve her perfusion and he felt she was adequate for wound healing. His suggestion was that she continued to see [place] and consider a synthetic skin graft if there was no underlying infection. He plans to see her back in six months or as needed. 08/01/18 on evaluation today patient appears to be doing better in regard to her right lateral ankle ulcer. Her left lateral ankle ulcer is about the same she still has bone involvement in evidence of necrosis. There does not appear to be evidence of infection at this time On the right lateral lower extremity. I have started her on the Augmentin she picked this up and started this yesterday. This is to get her through until she sees infectious disease which is scheduled for 08/12/18. 08/06/18 on evaluation today patient appears to be doing rather well considering my discussion with patient's daughter at the end of last week. The area which was  marked where she had erythema seems to be improved and this is good  news. With that being said overall the patient seems to be making good improvement when it comes to the overall appearance of the right lateral ankle ulcer although this has been slow she at least is coming around in this regard. Unfortunately in regard to the left lateral ankle ulcer this is osteomyelitis based on the pathology report as well is bone culture. Nonetheless we are still waiting CT scan. Unfortunately the MRI we originally ordered cannot be performed as the patient is a pacemaker which I had overlooked. Nonetheless we are working on the CT scan approval and scheduling as of now. She did go to the hospital over the weekend and was placed on IV Cefzo for a couple of days. Fortunately this seems to have improved the erythema quite significantly which is good news. There does not appear to be any evidence of worsening infection at this time. She did have some bleeding after the last debridement therefore I did not perform any sharp debridement in regard to left lateral ankle at this point. Patient has been approved for a snap vac for the right lateral ankle. 08/14/18; the patient with wounds over her bilateral lateral malleoli. The area on the right actually looks quite good. Been using a snap back on this area. Healthy granulation and appears to be filling in. Unfortunately the area on the left is really problematic. 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 Also noted to have cellulitis. She saw Dr. Novella Olive of infectious disease today and was put on linezolid. We are able to verify this with her pharmacy. She is completed the Augmentin that she was already on. We've been using Iodoflex to this area 08/23/18 on evaluation today patient's wounds both actually appear to be doing better compared to my prior evaluations. Fortunately she showing  signs of good improvement in regard to the overall wound status especially where were using the snap vac on the right. In regard to left lateral malleolus the wound bed actually appears to be much cleaner than previously noted. I do not feel any phone directly probed during evaluation today and though there is tendon noted this does not appear Caffey, [name] ([id number]) to be necrotic it's actually fairly good as far as the overall appearance of the tendon is concerned. In general the wound bed actually appears to be doing significantly better than it was previous. Patient is currently in the care of [name] and I did review that note today. He actually has her on two weeks of linezolid and then following the patient will be on 1-2 months of Keflex. That is the plan currently. She has been on antibiotics therapy as prescribed by myself initially starting on July 30, 2018 and has been on that continuously up to this point. 08/30/18 on evaluation today patient actually appears to be doing much better in regard to her right lateral malleolus ulcer. She has been tolerating the dressing changes specifically the snap vac without complication although she did have some issues with the seal currently. Apparently there was some trouble with getting it to maintain over the past week past Sunday. Nonetheless overall the wound appears better in regard to the right lateral malleolus region. In regard to left lateral malleolus this actually show some signs of additional granulation although there still tendon noted in the base of the wound this appears to be healthy not necrotic in any way whatsoever. We are considering potentially  using a snap vac for the left lateral malleolus as well the product wrap from [employer], [place], was present in the clinic today we're going to see this patient I did have her come in with me after obtaining consent from the patient and her daughter in order to look at the wound and  see if there's any recommendation one way or another as to whether or not they felt the snapback could be beneficial for the left lateral malleolus region. But the conclusion was that it might be but that this is definitely a little bit deeper wound than what traditionally would be utilized for a snap vac. 09/06/18 on evaluation today patient actually appears to be doing excellent in my pinion in regard to both ankle ulcers. She has been tolerating the dressing changes without complication which is great news. Specifically we have been using the snap vac. In regard to the right ankle I'm not even sure that this is going to be necessary for today and following as the wound has filled in quite nicely. In regard to the left ankle I do believe that we're seeing excellent epithelialization from the edge as well as granulation in the central portion the tendon is still exposed but there's no evidence of necrotic bone and in general I feel like the patient has made excellent progress even compared to last week with just one week of the snap vac. 09/11/18; this is a patient who has wounds on her bilateral lateral malleoli. Initially both of these were deep stage IV wounds in the setting of chronic arterial insufficiency. She has been revascularized. As I understand think she been using snap vacs to both of these wounds however the area on the right became more superficial and currently she is only using it on the left. Using silver collagen on the right and silver collagen under the back on the left I believe 09/19/18 on evaluation today patient actually appears to be doing very well in regard to her lateral malleolus or ulcers bilaterally. She has been tolerating the dressing changes without complication. Fortunately there does not appear to be any evidence of infection at this time. Overall I feel like she is improving in an excellent manner and I'm very pleased with the fact that everything seems to be  turning towards the better for her. This has obviously been a long road. 09/27/18 on evaluation today patient actually appears to be doing very well in regard to her bilateral lateral malleolus ulcers. She has been tolerating the dressing changes without complication. Fortunately there does not appear to be any evidence of infection at this time which is also great news. No fevers, chills, nausea, or vomiting noted at this time. Overall I feel like she is doing excellent with the snap vac on the left malleolus. She had 40 mL of fluid collection over the past week. 10/04/18 on evaluation today patient actually appears to be doing well in regard to her bilateral lateral malleolus ulcers. She continues to tolerate the dressing changes without complication. One issue that I see is the snap vac on the left lateral malleolus which appears to have sealed off some fluid underlying this area and has not really allowed it to heal to the degree that I would like to see. For that reason I did suggest at this point we may want to pack a small piece of packing strip into this region to allow it to more effectively wick out fluid. 10/11/18 in general the patient today does  not feel that she has been doing very well. She's been a little bit lethargic and subsequently is having bodyaches as well according to what she tells me today. With that being said overall she has been concerned with the fact that something may be worsening although to be honest her wounds really have not been appearing poorly. She does have a new ulcer on her left heel unfortunately. This may be pressure related. Nonetheless it seems to me to have potentially started at least as a blister I do not see any evidence of deep tissue injury. In regard to the left ankle the snap vac still seems to be causing the ceiling off of the deeper part of the wound which is in turn trapping fluid. I'm not extremely pleased with the overall appearance as far as  progress from last week to this week therefore I'm gonna discontinue the snap vac at this point. 10/18/18 patient unfortunately this point has not been feeling well for the past several days. She was seen by Grayland Ormond her primary care provider who is a Librarian, academic at [facility]. Subsequently she states that she's been very weak and generally feeling malaise. No fevers, chills, nausea, or vomiting noted at this time. With that being said bloodwork was performed at the PCP office on the 11th of this month which showed a white blood cell count of 10.7. This was repeated today Adell, [name] ([id number]) and shows a white blood cell count of 12.4. This does show signs of worsening. Coupled with the fact that she is feeling worse and that her left ankle wound is not really showing signs of improvement I feel like this is an indication that the osteomyelitis is likely exacerbating not improving. Overall I think we may also want to check her C-reactive protein and sedimentation rate. Actually did call [facility] office this afternoon while the patient was in the office here with me. Subsequently based on the findings we discussed treatment possibilities and I think that it is appropriate for [place] to go ahead and initiate treatment with doxycycline which I'm going to do. Subsequently he did agree to see about adding a CRP and sedimentation rate to her orders. If that has not already been drawn to where they can run it they will contact the patient she can come back to have that check. They are in agreement with plan as far as the patient and her daughter are concerned. Nonetheless also think we need to get in touch with Dr. Henreitta Leber office to see about getting the patient scheduled with him as soon as possible. Electronic Signature(s) Signed: 10/18/2018 2:24:17 PM By: Worthy Keeler PA-C Entered By: Worthy Keeler on 10/18/2018 14:18:55 Nasca, [name]  ([id number]) -------------------------------------------------------------------------------- Physical Exam Details Patient Name: [name] Date of Service: 10/18/2018 12:30 PM Medical Record Number: [id number] Patient Account Number: [id number] Date of Birth/Sex: Dec 15, 1922 (84 y.o. F) Treating RN: Montey Hora Primary Care Provider: Grayland Ormond Other Clinician: Referring Provider: Grayland Ormond Treating Provider/Extender: STONE III, HOYT Weeks in Treatment: 59 Constitutional Well-nourished and well-hydrated in no acute distress. Respiratory normal breathing without difficulty. clear to auscultation bilaterally. Cardiovascular regular rate and rhythm with normal S1, S2. no clubbing, cyanosis, significant edema, <3 sec cap refill. Psychiatric this patient is able to make decisions and demonstrates good insight into disease process. Alert and Oriented x 3. pleasant and cooperative. Notes On evaluation today patient's right lateral ankle actually appears to be doing better. Unfortunately in regard to  left lateral ankle the still probes down to bone. There is not any significant drainage coming from the wound but I am concerned about infection especially in light of the elevated white blood cell count and the general malaise that she is experiencing. Overall I think this is it definitely a turn for the worst unfortunately. In regard to her left heel she still continues to have discomfort there is some bruising as well to the lateral portion of the wound which has me concerned this has also seem to cause the wound to enlarge slightly. Electronic Signature(s) Signed: 10/18/2018 2:24:17 PM By: Worthy Keeler PA-C Entered By: Worthy Keeler on 10/18/2018 14:20:55 Smeal, [name] ([id number]) -------------------------------------------------------------------------------- Physician Orders Details Patient Name: [name] Date of Service: 10/18/2018 12:30  PM Medical Record Number: [id number] Patient Account Number: [id number] Date of Birth/Sex: [birth year] (84 y.o. F) Treating RN: Montey Hora Primary Care Provider: Grayland Ormond Other Clinician: Referring Provider: Grayland Ormond Treating Provider/Extender: Melburn Hake, HOYT Weeks in Treatment: 90 Verbal / Phone Orders: No Diagnosis Coding ICD-10 Coding Code Description 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, stage 4 L89.524 Pressure ulcer of left ankle, stage 4 S90.822A Blister (nonthermal), left foot, initial encounter A26.333 Pressure ulcer of left heel, stage 2 I70.233 Atherosclerosis of native arteries of right leg with ulceration of ankle I70.243 Atherosclerosis of native arteries of left leg with ulceration of ankle I89.0 Lymphedema, not elsewhere classified B35.4 Tinea corporis 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 o Cleanse wound with mild soap and water o May Shower, gently pat wound dry prior to applying new dressing. Wound #2 Left,Lateral Malleolus o Clean wound with Normal Saline. o Cleanse wound with mild soap and water o May Shower, gently pat wound dry prior to applying new dressing. Wound #3 Left Calcaneus o Clean wound with Normal Saline. o Cleanse wound with mild soap and water o May Shower, gently pat wound dry prior to applying new dressing. Anesthetic (add to Medication List) Wound #1 Right,Lateral Malleolus o Topical Lidocaine 4% cream applied to wound bed prior to debridement (In Clinic Only). Wound #2 Left,Lateral Malleolus o Topical Lidocaine 4% cream applied to wound bed prior to debridement (In Clinic Only). Wound #3 Left Calcaneus o Topical Lidocaine 4% cream applied to wound bed prior to debridement (In Clinic Only). Primary Wound Dressing Wound #1 Right,Lateral Malleolus o Silver Collagen Shonique, [place] [name]  ([id number]) Wound #2 Left,Lateral Malleolus o Silver Alginate Wound #3 Left Calcaneus o Silver Alginate Secondary Dressing Wound #1 Right,Lateral Malleolus o Boardered Foam Dressing Wound #2 Left,Lateral Malleolus o Boardered Foam Dressing Wound #3 Left Calcaneus o Boardered Foam Dressing Dressing Change Frequency Wound #1 Right,Lateral Malleolus o Change Dressing Monday, Wednesday, Friday Wound #2 Left,Lateral Malleolus o Change Dressing Monday, Wednesday, Friday Wound #3 Left Calcaneus o Change Dressing Monday, Wednesday, Friday Follow-up Appointments Wound #1 Right,Lateral Malleolus o Return Appointment in 1 week. Wound #2 Left,Lateral Malleolus o Return Appointment in 1 week. Wound #3 Left Calcaneus o Return Appointment in 1 week. Edema Control Wound #1 Right,Lateral Malleolus o Patient to wear own compression stockings o Patient to wear own Velcro compression garment. o Elevate legs to the level of the heart and pump ankles as often as possible Wound #2 Left,Lateral Malleolus o Patient to wear own compression stockings o Patient to wear own Velcro compression garment. o Elevate legs to the level of the heart and pump ankles as often  as possible Off-Loading Wound #1 Right,Lateral Malleolus o Turn and reposition every 2 hours o Other: - do not put pressure on your ankles Wound #2 Left,Lateral Malleolus o Turn and reposition every 2 hours Dun, [name] ([id number]) o Other: - do not put pressure on your ankles Additional Orders / Instructions Wound #1 Right,Lateral Malleolus o Increase protein intake. Wound #2 Left,Lateral Malleolus o Increase protein intake. Home Health Wound #1 [place] [facility]its - [place] Nurse may visit PRN to address patientos wound care needs. o FACE TO FACE ENCOUNTER: MEDICARE and MEDICAID PATIENTS: I certify that this patient is  under my care and that I had a face-to-face encounter that meets the physician face-to-face encounter requirements with this patient on this date. The encounter with the patient was in whole or in part for the following MEDICAL CONDITION: (primary reason for [place]) MEDICAL NECESSITY: I certify, that based on my findings, NURSING services are a medically necessary home health service. HOME BOUND STATUS: I certify that my clinical findings support that this patient is homebound (i.e., Due to illness or injury, pt requires aid of supportive devices such as crutches, cane, wheelchairs, walkers, the use of special transportation or the assistance of another person to leave their place of residence. There is a normal inability to leave the home and doing so requires considerable and taxing effort. Other absences are for medical reasons / religious services and are infrequent or of short duration when for other reasons). o If current dressing causes regression in wound condition, may D/C ordered dressing product/s and apply Normal Saline Moist Dressing daily until next [place] / Other MD appointment. [place] of regression in wound condition at [phone]. o Please direct any NON-WOUND related issues/requests for orders to patient's Primary Care Physician Wound #2 [place] Visits - [place] Nurse may visit PRN to address patientos wound care needs. o FACE TO FACE ENCOUNTER: MEDICARE and MEDICAID PATIENTS: I certify that this patient is under my care and that I had a face-to-face encounter that meets the physician face-to-face encounter requirements with this patient on this date. The encounter with the patient was in whole or in part for the following MEDICAL CONDITION: (primary reason for [place]) MEDICAL NECESSITY: I certify, that based on my findings, NURSING services are a medically  necessary home health service. HOME BOUND STATUS: I certify that my clinical findings support that this patient is homebound (i.e., Due to illness or injury, pt requires aid of supportive devices such as crutches, cane, wheelchairs, walkers, the use of special transportation or the assistance of another person to leave their place of residence. There is a normal inability to leave the home and doing so requires considerable and taxing effort. Other absences are for medical reasons / religious services and are infrequent or of short duration when for other reasons). o If current dressing causes regression in wound condition, may D/C ordered dressing product/s and apply Normal Saline Moist Dressing daily until next [place] / Other MD appointment. [place] of regression in wound condition at [phone]. o Please direct any NON-WOUND related issues/requests for orders to patient's Primary Care Physician Wound #3 Left Calcaneus o [place] [facility]its - [place] Nurse may visit PRN to address patientos wound care needs. o FACE TO FACE ENCOUNTER: MEDICARE and MEDICAID PATIENTS: I certify that this patient is under my  care and that I had a face-to-face encounter that meets the physician face-to-face encounter requirements with this patient on this date. The encounter with the patient was in whole or in part for the following MEDICAL CONDITION: (primary reason for [place]) MEDICAL NECESSITY: I certify, that based on my findings, NURSING services are a medically necessary home health service. HOME BOUND STATUS: I certify that my clinical findings support that this patient is homebound (i.e., Due to illness or injury, pt requires aid of supportive devices such as crutches, cane, wheelchairs, walkers, the use of special transportation or the assistance of another person to leave their place of residence. There is a normal inability to  leave the home SHAVONNA, CORELLA. ([id number]) and doing so requires considerable and taxing effort. Other absences are for medical reasons / religious services and are infrequent or of short duration when for other reasons). o If current dressing causes regression in wound condition, may D/C ordered dressing product/s and apply Normal Saline Moist Dressing daily until next [place] / Other MD appointment. [place] of regression in wound condition at [phone]. o Please direct any NON-WOUND related issues/requests for orders to patient's Primary Care Physician Consults o Infectious Disease Patient Medications Allergies: Sulfa (Sulfonamide Antibiotics), metronidazole, monistat Notifications Medication Indication Start End doxycycline hyclate 10/18/2018 DOSE 1 - oral 100 mg capsule - 1 capsule oral taken 2 times a day for 14 days. Patient to stop the vitamins while taking this medication Electronic Signature(s) Signed: 10/18/2018 1:40:01 PM By: Worthy Keeler PA-C Entered By: Worthy Keeler on 10/18/2018 13:39:59 Sarkisyan, [name] ([id number]) -------------------------------------------------------------------------------- Problem List Details Patient Name: [name] Date of Service: 10/18/2018 12:30 PM Medical Record Number: [id number] Patient Account Number: [id number] Date of Birth/Sex: 12-03-1923 (84 y.o. F) Treating RN: Montey Hora Primary Care Provider: Grayland Ormond Other Clinician: Referring Provider: Grayland Ormond Treating Provider/Extender: Melburn Hake, HOYT Weeks in Treatment: 29 Active Problems ICD-10 Evaluated Encounter Code Description Active Date Today Diagnosis 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, stage 4 05/10/2018 No Yes L89.524 Pressure ulcer of left ankle, stage 4 04/25/2018 No Yes S90.822A Blister (nonthermal), left foot, initial encounter 10/11/2018 No Yes L89.622 Pressure ulcer of left heel, stage 2  10/11/2018 No Yes I70.233 Atherosclerosis of native arteries of right leg with ulceration of 08/10/2017 No Yes ankle I70.243 Atherosclerosis of native arteries of left leg with ulceration of 07/10/2018 No Yes ankle I89.0 Lymphedema, not elsewhere classified 08/10/2017 No Yes B35.4 Tinea corporis 09/28/2017 No Yes 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 F) Treating RN: Montey Hora Primary Care Provider: Grayland Ormond Other Clinician: Referring Provider: Grayland Ormond Treating Provider/Extender: Melburn Hake, HOYT Weeks in Treatment: 4 Subjective Chief Complaint Information obtained from Patient Patient is here for right lateral malleolus and left lateral malleolus ulcer History of Present Illness (HPI) 84 year old patient who most recently has been seeing both podiatry and vascular surgery for a long-standing ulcer of her right lateral malleolus which has been treated with various methodologies. Dr. Amalia Hailey the podiatrist saw her on 07/20/2017 and sent her to the wound center for possible hyperbaric oxygen therapy.  past medical history of peripheral vascular disease, varicose veins, status post appendectomy, basal cell carcinoma excision from the left leg, cholecystectomy, pacemaker placement, right lower extremity angiography done by Dr. dew in March 2017 with placement of a stent. there is also note of a successful ablation of the right small  saphenous vein done which was reviewed by ultrasound on 10/24/2016. the patient had a right small saphenous vein ablation done on 10/20/2016. The patient has never been a smoker. She has been seen by Dr. Corene Cornea dew the vascular surgeon who most recently saw her on 06/15/2017 for evaluation of ongoing problems with right leg swelling. She had a lower extremity arterial duplex examination done(02/13/17) which showed patent distal right superficial femoral artery stent and above-the-knee popliteal stent without evidence of restenosis. The ABI was more than 1.3 on the right and more than 1.3 on the left. This was consistent with noncompressible arteries due to medial calcification. The right great toe pressure and PPG waveforms are within normal limits and the left great toe pressure and PPG waveforms are decreased. he recommended she continue to wear her compression stockings and continue with elevation. She is scheduled to have a noninvasive arterial study in the near future 08/16/2017 -- had a lower extremity arterial duplex examination done which showed patent distal right superficial femoral artery stent and above-the-knee popliteal stent without evidence of restenosis. The ABI was more than 1.3 on the right and more than 1.3 on the left. This was consistent with noncompressible arteries due to medial calcification. The right great toe pressure and PPG waveforms are within normal limits and the left great toe pressure and PPG waveforms are decreased. the x-ray of the right ankle has not yet been done 08/24/2017 -- had a right ankle x-ray -- IMPRESSION:1. No fracture, bone lesion or evidence of osteomyelitis. 2. Lateral soft tissue swelling with a soft tissue ulcer. she has not yet seen the vascular surgeon for review 08/31/17 on evaluation today patient's wound appears to be showing signs of improvement. She still with her appointment with vascular in order to review her results of her vascular  study and then determine if any intervention would be recommended at that time. No fevers, chills, nausea, or vomiting noted at this time. She has been tolerating the dressing changes without complication. 09/28/17 on evaluation today patient's wound appears to show signs of good improvement in regard to the granulation tissue which is surfacing. There is still a layer of slough covering the wound and the posterior portion is still significantly deeper than the anterior nonetheless there has been some good sign of things moving towards the better. She is going to go back to Dr. dew for reevaluation to ensure her blood flow is still appropriate. That will be before her next evaluation with [place] next week. No fevers, chills, nausea, or vomiting noted at this time. Patient does have some discomfort rated to be a 3-4/10 depending on activity specifically cleansing the wound makes it worse. 10/05/2017 -- the patient was seen by Dr. Lucky Cowboy last week and noninvasive studies showed a normal right ABI with brisk CAITLINN, KLINKER. ([id number]) triphasic waveforms consistent with no arterial insufficiency including normal digital pressures. The duplex showed a patent distal right SFA stent and the proximal SFA was also normal. He was pleased with her test and thought she should have enough of perfusion for normal wound healing. He would see her back in 6 months time. 12/21/17 on evaluation today patient appears to be doing fairly  well in regard to her right lateral ankle wound. Unfortunately the main issue that she is expansion at this point is that she is having some issues with what appears to be some cellulitis in the right anterior shin. She has also been noting a little bit of uncomfortable feeling especially last night and her ankle area. I'm afraid that she made the developing a little bit of an infection. With that being said I think it is in the early stages. 12/28/17 on evaluation today patient's ankle  appears to be doing excellent. She's making good progress at this point the cellulitis seems to have improved after last week's evaluation. Overall she is having no significant discomfort which is excellent news. She does have an appointment with Dr. dew on March 29, 2018 for reevaluation in regard to the stent he placed. She seems to have excellent blood flow in the right lower extremity. 01/19/12 on evaluation today patient's wound appears to be doing very well. In fact she does not appear to require debridement at this point, there's no evidence of infection, and overall from the standpoint of the wound she seems to be doing very well. With that being said I believe that it may be time to switch to different dressing away from the [facility] Dressing she tells me she does have a lot going on her friend actually passed away yesterday and she's also having a lot of issues with her husband this obviously is weighing heavy on her as far as your thoughts and concerns today. 01/25/18 on evaluation today patient appears to be doing fairly well in regard to her right lateral malleolus. She has been tolerating the dressing changes without complication. Overall I feel like this is definitely showing signs of improvement as far as how the overall appearance of the wound is there's also evidence of epithelium start to migrate over the granulation tissue. In general I think that she is progressing nicely as far as the wound is concerned. The only concern she really has is whether or not we can switch to every other week visits in order to avoid having as many appointments as her daughters have a difficult time getting her to her appointments as well as the patient's husband to his he is not doing very well at this point. 02/22/18 on evaluation today patient's right lateral malleolus ulcer appears to be doing great. She has been tolerating the dressing changes without complication. Overall you making excellent  progress at this time. Patient is having no significant discomfort. 03/15/18 on evaluation today patient appears to be doing much more poorly in regard to her right lateral ankle ulcer at this point. Unfortunately since have last seen her her husband has passed just a few days ago is obviously weighed heavily on her her daughter also had surgery well she is with her today as usual. There does not appear to be any evidence of infection she does seem to have significant contusion/deep tissue injury to the right lateral malleolus which was not noted previous when I saw her last. It's hard to tell of exactly when this injury occurred although during the time she was spending the night in the hospital this may have been most likely. 03/22/18 on evaluation today patient appears to actually be doing very well in regard to her ulcer. She did unfortunately have a setback which was noted last week however the good news is we seem to be getting back on track and in fact the wound in the  core did still have some necrotic tissue which will be addressed at this point today but in general I'm seeing signs that things are on the up and up. She is glad to hear this obviously she's been somewhat concerned that due to the how her wound digressed more recently. 03/29/18 on evaluation today patient appears to be doing fairly well in regard to her right lower extremity lateral malleolus ulcer. She unfortunately does have a new area of pressure injury over the inferior portion where the wound has opened up a little bit larger secondary to the pressure she seems to be getting. She does tell me sometimes when she sleeps at night that it actually hurts and does seem to be pushing on the area little bit more unfortunately. There does not appear to be any evidence of infection which is good news. She has been tolerating the dressing changes without complication. She also did have some bruising in the left second and third toes  due to the fact that she may have bump this or injured it although she has neuropathy so she does not feel she did move recently that may have been where this came from. Nonetheless there does not appear to be any evidence of infection at this time. 04/12/18 on evaluation today patient's wound on the right lateral ankle actually appears to be doing a little bit better with a lot of necrotic docking tissue centrally loosening up in clearing away. However she does have the beginnings of a deep tissue injury on the left lateral malleolus likely due to the fact we've been trying offload the right as much as we have. I think she may benefit from an assistive soft device to help with offloading and it looks like they're looking at one of the doughnut conditions that wraps around the lower leg to offload which I think will definitely do a good job. With that being said I think we definitely need to address this issue on the left before it becomes a wound. Patient is not having significant pain. TATTIANNA, SCHNARR ([id number]) 04/19/18 on evaluation today patient appears to be doing excellent in regard to the progress she's made with her right lateral ankle ulcer. The left ankle region which did show evidence of a deep tissue injury seems to be resolving there's little fluid noted underneath and a blister there's nothing open at this point in time overall I feel like this is progressing nicely which is good news. She does not seem to be having significant discomfort at this point which is also good news. 04/25/18-She is here in follow up evaluation for bilateral lateral malleolar ulcers. The right lateral malleolus ulcer with pale subcutaneous tissue exposure, central area of ulcer with tendon/periosteum exposed. The left lateral malleolus ulcer now with central area of nonviable tissue, otherwise deep tissue injury. She is wearing compression wraps to the left lower extremity, she will place the right lower  extremity compression wraps on when she gets home. She will be out of town over the weekend and return next week and follow-up appointment. She completed her doxycycline this morning 05/03/18 on evaluation today patient appears to be doing very well in regard to her right lateral ankle ulcer in general. At least she's showing some signs of improvement in this regard. Unfortunately she has some additional injury to the left lateral malleolus region which appears to be new likely even over the past several days. Again this determination is based on the overall appearance. With that being  said the patient is obviously frustrated about this currently. 05/10/18-She is here in follow-up evaluation for bilateral lateral malleolar ulcers. She states she has purchased offloading shoes/boots and they will arrive tomorrow. She was asked to bring them in the office at next week's appointment so her provider is aware of product being utilized. She continues to sleep on right or left side, she has been encouraged to sleep on her back. The right lateral malleolus ulcer is precariously close to peri-osteum; will order xray. The left lateral malleolus ulcer is improved. Will switch back to santyl; she will follow up next week. 05/17/18 on evaluation today patient actually appears to be doing very well in regard to her malleolus her ulcers compared to last time I saw them. She does not seem to have as much in the way of contusion at this point which is great news. With that being said she does continue to have discomfort and I do believe that she is still continuing to benefit from the offloading/pressure reducing boots that were recommended. I think this is the key to trying to get this to heal up completely. 05/24/18 on evaluation today patient actually appears to be doing worse at this point in time unfortunately compared to her last week's evaluation. She is having really no increased pain which is good news  unfortunately she does have more maceration in your theme and noted surrounding the right lateral ankle the left lateral ankle is not really is erythematous I do not see signs of the overt cellulitis on that side. Unfortunately the wounds do not seem to have shown any signs of improvement since the last evaluation. She also has significant swelling especially on the right compared to previous some of this may be due to infection however also think that she may be served better while she has these wounds by compression wrapping versus continuing to use the Juxta-Lite for the time being. Especially with the amount of drainage that she is experiencing at this point. No fevers, chills, nausea, or vomiting noted at this time. 05/31/18 on evaluation today patient appears to actually be doing better in regard to her right lateral lower extremity ulcer specifically on the malleolus region. She has been tolerating the antibiotic without complication. With that being said she still continues to have issues but a little bit of redness although nothing like she what she was experiencing previous. She still continues to pressure to her ankle area she did get the problem on offloading boots unfortunately she will not wear them she states there too uncomfortable and she can't get in and out of the bed. Nonetheless at this point her wounds seem to be continually getting worse which is not what we want I'm getting somewhat concerned about her progress and how things are going to proceed if we do not intervene in some way shape or form. I therefore had a very lengthy conversation today about offloading yet again and even made a specific suggestion for switching her to a memory foam mattress and even gave the information for a specific one that they could look at getting if it was something that they were interested in considering. She does not want to be considered for a hospital bed air mattress although honestly  insurance would not cover it that she does not have any wounds on her trunk. 06/14/18 on evaluation today both wounds over the bilateral lateral malleolus her ulcers appear to be doing better there's no evidence of pressure injury at this point.  She did get the foam mattress for her bed and this does seem to have been extremely beneficial for her in my pinion. Her daughter states that she is having difficulty getting out of bed because of how soft it is. The patient also relates this to be. Nonetheless I do feel like she's actually doing better. Unfortunately right after and around the time she was getting the mattress she also sustained a fall when she got up to go pick up the phone and ended up injuring her right elbow she has 18 sutures in place. We are not caring for this currently although home health is going to be taking the sutures out shortly. Nonetheless this may be something that we need to evaluate going forward. It depends on how well it has or has not healed in the end. She also recently saw an orthopedic specialist for an injection in the right shoulder just before her fall unfortunately the fall seems to have worsened her pain. 06/21/18 on evaluation today patient appears to be doing about the same in regard to her lateral malleolus ulcers. Both appear to be just a little bit deeper but again we are clinging away the necrotic and dead tissue which I think is why this is progressing towards a deeper realm as opposed to improving from my measurement standpoint in that regard. Nonetheless she has been tolerating the dressing changes she absolutely hates the memory foam mattress topper that was obtained for her nonetheless Pasha, Gadison [name] ([id number]) I do believe this is still doing excellent as far as taking care of excess pressure in regard to the lateral malleolus regions. She in fact has no pressure injury that I see whereas in weeks past it was week by week I was constantly seeing  new pressure injuries. Overall I think it has been very beneficial for her. 07/03/18; patient arrives in my clinic today. She has deep punched out areas over her bilateral lateral malleoli. The area on the right has some more depth. We spent a lot of time today talking about pressure relief for these areas. This started when her daughter asked for a prescription for a memory foam mattress. I have never written a prescription for a mattress and I don't think insurances would pay for that on an ordinary bed. In any case he came up that she has foam boots that she refuses to wear. I would suggest going to these before any other offloading issues when she is in bed. They say she is meticulous about offloading this the rest of the day 07/10/18- She is seen in follow-up evaluation for bilateral, lateral malleolus ulcers. There is no improvement in the ulcers. She has purchased and is sleeping on a memory foam mattress/overlay, she has been using the offloading boots nightly over the past week. She has a follow up appointment with vascular medicine at the end of October, in my opinion this follow up should be expedited given her deterioration and suboptimal TBI results. We will order plain film xray of the left ankle as deeper structures are palpable; would consider having MRI, regardless of xray report(s). The ulcers will be treated with iodoflex/iodosorb, she is unable to safely change the dressings daily with santyl. 07/19/18 on evaluation today patient appears to be doing in general visually well in regard to her bilateral lateral malleolus ulcers. She has been tolerating the dressing changes without complication which is good news. With that being said we did have an x-ray performed on 07/12/18 which revealed  a slight loosen see in the lateral portion of the distal left fibula which may represent artifact but underline lytic destruction or osteomyelitis could not be excluded. MRI was recommended. With  that being said we can see about getting the patient scheduled for an MRI to further evaluate this area. In fact we have that scheduled currently for August 20 19,019. 07/26/18 on evaluation today patient's wound on the right lateral ankle actually appears to be doing fairly well at this point in my pinion. She has made some good progress currently. With that being said unfortunately in regard to the left lateral ankle ulcer this seems to be a little bit more problematic at this time. In fact as I further evaluated the situation she actually had bone exposed which is the first time that's been the case in the bone appear to be necrotic. Currently I did review patient's note from Dr. Bunnie Domino office with [facility]. He stated that ABI was 1.26 on the right and 0.95 on the left with good waveforms. Her perfusion is stable not reduced from previous studies and her digital waveforms were pretty good particularly on the right. His conclusion upon review of the note was that there was not much she could do to improve her perfusion and he felt she was adequate for wound healing. His suggestion was that she continued to see [place] and consider a synthetic skin graft if there was no underlying infection. He plans to see her back in six months or as needed. 08/01/18 on evaluation today patient appears to be doing better in regard to her right lateral ankle ulcer. Her left lateral ankle ulcer is about the same she still has bone involvement in evidence of necrosis. There does not appear to be evidence of infection at this time On the right lateral lower extremity. I have started her on the Augmentin she picked this up and started this yesterday. This is to get her through until she sees infectious disease which is scheduled for 08/12/18. 08/06/18 on evaluation today patient appears to be doing rather well considering my discussion with patient's daughter at the end of last week. The area which was  marked where she had erythema seems to be improved and this is good news. With that being said overall the patient seems to be making good improvement when it comes to the overall appearance of the right lateral ankle ulcer although this has been slow she at least is coming around in this regard. Unfortunately in regard to the left lateral ankle ulcer this is osteomyelitis based on the pathology report as well is bone culture. Nonetheless we are still waiting CT scan. Unfortunately the MRI we originally ordered cannot be performed as the patient is a pacemaker which I had overlooked. Nonetheless we are working on the CT scan approval and scheduling as of now. She did go to the hospital over the weekend and was placed on IV Cefzo for a couple of days. Fortunately this seems to have improved the erythema quite significantly which is good news. There does not appear to be any evidence of worsening infection at this time. She did have some bleeding after the last debridement therefore I did not perform any sharp debridement in regard to left lateral ankle at this point. Patient has been approved for a snap vac for the right lateral ankle. 08/14/18; the patient with wounds over her bilateral lateral malleoli. The area on the right actually looks quite good. Been using a  snap back on this area. Healthy granulation and appears to be filling in. Unfortunately the area on the left is really problematic. 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 Also noted to have cellulitis. She saw Dr. Novella Olive of infectious disease today and was put on linezolid. We are able to verify this with her pharmacy. She is completed the Augmentin that she was already on. We've been using Iodoflex to this BREUNA, LOVEALL ([id number]) area 08/23/18 on evaluation today patient's wounds both actually appear to be doing better compared to my prior  evaluations. Fortunately she showing signs of good improvement in regard to the overall wound status especially where were using the snap vac on the right. In regard to left lateral malleolus the wound bed actually appears to be much cleaner than previously noted. I do not feel any phone directly probed during evaluation today and though there is tendon noted this does not appear to be necrotic it's actually fairly good as far as the overall appearance of the tendon is concerned. In general the wound bed actually appears to be doing significantly better than it was previous. Patient is currently in the care of [name] and I did review that note today. He actually has her on two weeks of linezolid and then following the patient will be on 1-2 months of Keflex. That is the plan currently. She has been on antibiotics therapy as prescribed by myself initially starting on July 30, 2018 and has been on that continuously up to this point. 08/30/18 on evaluation today patient actually appears to be doing much better in regard to her right lateral malleolus ulcer. She has been tolerating the dressing changes specifically the snap vac without complication although she did have some issues with the seal currently. Apparently there was some trouble with getting it to maintain over the past week past Sunday. Nonetheless overall the wound appears better in regard to the right lateral malleolus region. In regard to left lateral malleolus this actually show some signs of additional granulation although there still tendon noted in the base of the wound this appears to be healthy not necrotic in any way whatsoever. We are considering potentially using a snap vac for the left lateral malleolus as well the product wrap from [employer], [place], was present in the clinic today we're going to see this patient I did have her come in with me after obtaining consent from the patient and her daughter in order to look at the wound  and see if there's any recommendation one way or another as to whether or not they felt the snapback could be beneficial for the left lateral malleolus region. But the conclusion was that it might be but that this is definitely a little bit deeper wound than what traditionally would be utilized for a snap vac. 09/06/18 on evaluation today patient actually appears to be doing excellent in my pinion in regard to both ankle ulcers. She has been tolerating the dressing changes without complication which is great news. Specifically we have been using the snap vac. In regard to the right ankle I'm not even sure that this is going to be necessary for today and following as the wound has filled in quite nicely. In regard to the left ankle I do believe that we're seeing excellent epithelialization from the edge as well as granulation in the central portion the tendon is still exposed but there's  no evidence of necrotic bone and in general I feel like the patient has made excellent progress even compared to last week with just one week of the snap vac. 09/11/18; this is a patient who has wounds on her bilateral lateral malleoli. Initially both of these were deep stage IV wounds in the setting of chronic arterial insufficiency. She has been revascularized. As I understand think she been using snap vacs to both of these wounds however the area on the right became more superficial and currently she is only using it on the left. Using silver collagen on the right and silver collagen under the back on the left I believe 09/19/18 on evaluation today patient actually appears to be doing very well in regard to her lateral malleolus or ulcers bilaterally. She has been tolerating the dressing changes without complication. Fortunately there does not appear to be any evidence of infection at this time. Overall I feel like she is improving in an excellent manner and I'm very pleased with the fact that everything seems to be  turning towards the better for her. This has obviously been a long road. 09/27/18 on evaluation today patient actually appears to be doing very well in regard to her bilateral lateral malleolus ulcers. She has been tolerating the dressing changes without complication. Fortunately there does not appear to be any evidence of infection at this time which is also great news. No fevers, chills, nausea, or vomiting noted at this time. Overall I feel like she is doing excellent with the snap vac on the left malleolus. She had 40 mL of fluid collection over the past week. 10/04/18 on evaluation today patient actually appears to be doing well in regard to her bilateral lateral malleolus ulcers. She continues to tolerate the dressing changes without complication. One issue that I see is the snap vac on the left lateral malleolus which appears to have sealed off some fluid underlying this area and has not really allowed it to heal to the degree that I would like to see. For that reason I did suggest at this point we may want to pack a small piece of packing strip into this region to allow it to more effectively wick out fluid. 10/11/18 in general the patient today does not feel that she has been doing very well. She's been a little bit lethargic and subsequently is having bodyaches as well according to what she tells me today. With that being said overall she has been concerned with the fact that something may be worsening although to be honest her wounds really have not been appearing poorly. She does have a new ulcer on her left heel unfortunately. This may be pressure related. Nonetheless it seems to me to have potentially started at least as a blister I do not see any evidence of deep tissue injury. In regard to the left ankle the snap vac still seems to be causing the ceiling off of the deeper part of the wound which is in turn trapping fluid. I'm not extremely pleased with the overall appearance as far as  progress from last week to this week therefore I'm gonna discontinue Vonruden, [name] ([id number]) the snap vac at this point. 10/18/18 patient unfortunately this point has not been feeling well for the past several days. She was seen by Grayland Ormond her primary care provider who is a Librarian, academic at [facility]. Subsequently she states that she's been very weak and generally feeling malaise. No fevers, chills, nausea, or vomiting  noted at this time. With that being said bloodwork was performed at the PCP office on the 11th of this month which showed a white blood cell count of 10.7. This was repeated today and shows a white blood cell count of 12.4. This does show signs of worsening. Coupled with the fact that she is feeling worse and that her left ankle wound is not really showing signs of improvement I feel like this is an indication that the osteomyelitis is likely exacerbating not improving. Overall I think we may also want to check her C-reactive protein and sedimentation rate. Actually did call [facility] office this afternoon while the patient was in the office here with me. Subsequently based on the findings we discussed treatment possibilities and I think that it is appropriate for [place] to go ahead and initiate treatment with doxycycline which I'm going to do. Subsequently he did agree to see about adding a CRP and sedimentation rate to her orders. If that has not already been drawn to where they can run it they will contact the patient she can come back to have that check. They are in agreement with plan as far as the patient and her daughter are concerned. Nonetheless also think we need to get in touch with Dr. Henreitta Leber office to see about getting the patient scheduled with him as soon as possible. Patient History Information obtained from Patient. Family History Cancer - Father,Siblings, Heart Disease - Siblings, No family history of Diabetes, Hereditary  Spherocytosis, Hypertension, Kidney Disease, Lung Disease, Seizures, Stroke, Thyroid Problems, Tuberculosis. Social History Never smoker, Marital Status - Married, Alcohol Use - Never, Drug Use - No History, Caffeine Use - Rarely. Review of Systems (ROS) Constitutional Symptoms (General Health) Denies complaints or symptoms of Fever, Chills. Respiratory The patient has no complaints or symptoms. Cardiovascular The patient has no complaints or symptoms. Psychiatric The patient has no complaints or symptoms. Objective Constitutional Well-nourished and well-hydrated in no acute distress. Vitals Time Taken: 12:43 PM, Height: 65 in, Weight: 154.3 lbs, BMI: 25.7, Temperature: 98.1 F, Pulse: 87 bpm, Respiratory Rate: 16 breaths/min, Blood Pressure: 125/61 mmHg. Respiratory normal breathing without difficulty. clear to auscultation bilaterally. WYOMA, GENSON ([id number]) Cardiovascular regular rate and rhythm with normal S1, S2. no clubbing, cyanosis, significant edema, Psychiatric this patient is able to make decisions and demonstrates good insight into disease process. Alert and Oriented x 3. pleasant and cooperative. General Notes: On evaluation today patient's right lateral ankle actually appears to be doing better. Unfortunately in regard to left lateral ankle the still probes down to bone. There is not any significant drainage coming from the wound but I am concerned about infection especially in light of the elevated white blood cell count and the general malaise that she is experiencing. Overall I think this is it definitely a turn for the worst unfortunately. In regard to her left heel she still continues to have discomfort there is some bruising as well to the lateral portion of the wound which has me concerned this has also seem to cause the wound to enlarge slightly. Integumentary (Hair, Skin) Wound #1 status is Open. Original cause of wound was Gradually Appeared. The  wound is located on the Right,Lateral Malleolus. The wound measures 0.4cm length x 0.4cm width x 0.1cm depth; 0.126cm^2 area and 0.013cm^3 volume. There is Fat Layer (Subcutaneous Tissue) Exposed exposed. There is no tunneling or undermining noted. There is a small amount of serous drainage noted. The wound margin is distinct with the outline  attached to the wound base. There is no granulation within the wound bed. There is a medium (34-66%) amount of necrotic tissue within the wound bed including Adherent Slough. The periwound skin appearance exhibited: Rubor. The periwound skin appearance did not exhibit: Callus, Crepitus, Excoriation, Induration, Rash, Scarring, Dry/Scaly, Maceration, Atrophie Blanche, Cyanosis, Ecchymosis, Hemosiderin Staining, Mottled, Pallor, Erythema. Periwound temperature was noted as No Abnormality. Wound #2 status is Open. Original cause of wound was Gradually Appeared. The wound is located on the Left,Lateral Malleolus. The wound measures 1.4cm length x 1cm width x 0.6cm depth; 1.1cm^2 area and 0.66cm^3 volume. There is Fat Layer (Subcutaneous Tissue) Exposed exposed. There is no tunneling or undermining noted. There is a medium amount of serous drainage noted. The wound margin is epibole. There is small (1-33%) pink, pale granulation within the wound bed. There is a large (67-100%) amount of necrotic tissue within the wound bed including Adherent Slough. The periwound skin appearance exhibited: Maceration. The periwound skin appearance did not exhibit: Callus, Crepitus, Excoriation, Induration, Rash, Scarring, Dry/Scaly, Atrophie Blanche, Cyanosis, Ecchymosis, Hemosiderin Staining, Mottled, Pallor, Rubor, Erythema. Periwound temperature was noted as No Abnormality. The periwound has tenderness on palpation. Wound #3 status is Open. Original cause of wound was Gradually Appeared. The wound is located on the Left Calcaneus. The wound measures 2.2cm length x 2.4cm width x  0.1cm depth; 4.147cm^2 area and 0.415cm^3 volume. The wound is limited to skin breakdown. There is a large amount of serous drainage noted. The wound margin is indistinct and nonvisible. There is no granulation within the wound bed. There is a small (1-33%) amount of necrotic tissue within the wound bed including Eschar and Adherent Slough. The periwound skin appearance exhibited: Maceration. The periwound skin appearance did not exhibit: Callus, Crepitus, Excoriation, Induration, Rash, Scarring, Dry/Scaly, Atrophie Blanche, Cyanosis, Ecchymosis, Hemosiderin Staining, Mottled, Pallor, Rubor, Erythema. 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 Cleanse wound with mild soap and water May Shower, gently pat wound dry prior to applying new dressing. Wound #2 Left,Lateral Malleolus: Clean wound with Normal Saline. Cleanse wound with mild soap and water May Shower, gently pat wound dry prior to applying new dressing. Wound #3 Left Calcaneus: Clean wound with Normal Saline. Cleanse wound with mild soap and water May Shower, gently pat wound dry prior to applying new dressing. Anesthetic (add to Medication List): Wound #1 Right,Lateral Malleolus: Topical Lidocaine 4% cream applied to wound bed prior to debridement (In Clinic Only). Wound #2 Left,Lateral Malleolus: Topical Lidocaine 4% cream applied to wound bed prior to debridement (In Clinic Only). Wound #3 Left  Calcaneus: Topical Lidocaine 4% cream applied to wound bed prior to debridement (In Clinic Only). Primary Wound Dressing: Wound #1 Right,Lateral Malleolus: Silver Collagen Wound #2 Left,Lateral Malleolus: Silver Alginate Wound #3 Left Calcaneus: Silver Alginate Secondary Dressing: Wound #1 Right,Lateral Malleolus: Boardered Foam Dressing Wound #2 Left,Lateral Malleolus: Boardered Foam Dressing Wound #3 Left Calcaneus: Boardered Foam Dressing Dressing Change Frequency: Wound #1 Right,Lateral Malleolus: Change Dressing Monday, Wednesday, Friday Wound #2 Left,Lateral Malleolus: Change Dressing Monday, Wednesday, Friday Wound #3 Left Calcaneus:  Change Dressing Monday, Wednesday, Friday Follow-up Appointments: Wound #1 Right,Lateral Malleolus: Return Appointment in 1 week. Wound #2 Left,Lateral Malleolus: Return Appointment in 1 week. Wound #3 Left Calcaneus: Return Appointment in 1 week. Edema Control: Wound #1 Right,Lateral Malleolus: Patient to wear own compression stockings Markowicz, [name] ([id number]) Patient to wear own Velcro compression garment. Elevate legs to the level of the heart and pump ankles as often as possible Wound #2 Left,Lateral Malleolus: Patient to wear own compression stockings Patient to wear own Velcro compression garment. Elevate legs to the level of the heart and pump ankles as often as possible Off-Loading: Wound #1 Right,Lateral Malleolus: Turn and reposition every 2 hours Other: - do not put pressure on your ankles Wound #2 Left,Lateral Malleolus: Turn and reposition every 2 hours Other: - do not put pressure on your ankles Additional Orders / Instructions: Wound #1 Right,Lateral Malleolus: Increase protein intake. Wound #2 Left,Lateral Malleolus: Increase protein intake. Home Health: Wound #1 Right,Lateral Malleolus: [place] Visits - [place] Nurse may visit PRN to address patient s wound care needs. FACE  TO FACE ENCOUNTER: MEDICARE and MEDICAID PATIENTS: I certify that this patient is under my care and that I had a face-to-face encounter that meets the physician face-to-face encounter requirements with this patient on this date. The encounter with the patient was in whole or in part for the following MEDICAL CONDITION: (primary reason for [place]) MEDICAL NECESSITY: I certify, that based on my findings, NURSING services are a medically necessary home health service. HOME BOUND STATUS: I certify that my clinical findings support that this patient is homebound (i.e., Due to illness or injury, pt requires aid of supportive devices such as crutches, cane, wheelchairs, walkers, the use of special transportation or the assistance of another person to leave their place of residence. There is a normal inability to leave the home and doing so requires considerable and taxing effort. Other absences are for medical reasons / religious services and are infrequent or of short duration when for other reasons). If current dressing causes regression in wound condition, may D/C ordered dressing product/s and apply Normal Saline Moist Dressing daily until next [place] / Other MD appointment. [place] of regression in wound condition at [phone]. Please direct any NON-WOUND related issues/requests for orders to patient's Primary Care Physician Wound #2 Left,Lateral Malleolus: [place] Visits - [place] Nurse may visit PRN to address patient s wound care needs. FACE TO FACE ENCOUNTER: MEDICARE and MEDICAID PATIENTS: I certify that this patient is under my care and that I had a face-to-face encounter that meets the physician face-to-face encounter requirements with this patient on this date. The encounter with the patient was in whole or in part for the following MEDICAL CONDITION: (primary reason for [place]) MEDICAL NECESSITY: I certify,  that based on my findings, NURSING services are a medically necessary home health service. HOME BOUND STATUS: I certify that my clinical findings support that this patient is homebound (i.e., Due to illness or injury, pt requires aid of supportive devices such as crutches, cane, wheelchairs, walkers, the use of special transportation or the assistance of another person to leave their place of residence. There is a normal inability to leave the home and doing so requires considerable and taxing effort. Other absences are for medical reasons / religious services and are infrequent or of short duration when for other reasons). If current dressing causes regression in wound condition, may D/C  ordered dressing product/s and apply Normal Saline Moist Dressing daily until next [place] / Other MD appointment. [place] of regression in wound condition at [phone]. Please direct any NON-WOUND related issues/requests for orders to patient's Primary Care Physician Wound #3 Left Calcaneus: [place] Visits - [place] Nurse may visit PRN to address patient s wound care needs. FACE TO FACE ENCOUNTER: MEDICARE and MEDICAID PATIENTS: I certify that this patient is under my care and that I had a face-to-face encounter that meets the physician face-to-face encounter requirements with this patient on this date. The encounter with the patient was in whole or in part for the following MEDICAL CONDITION: (primary reason for [place]) MEDICAL NECESSITY: I certify, that based on my findings, NURSING services are a medically necessary home health service. HOME BOUND STATUS: I certify that my clinical findings support that this patient is homebound (i.e., Due to illness or injury, pt requires aid of supportive devices such as crutches, cane, wheelchairs, walkers, the use of special Cazarez, JAZLEN OGARRO ([id number]) transportation or the assistance of another  person to leave their place of residence. There is a normal inability to leave the home and doing so requires considerable and taxing effort. Other absences are for medical reasons / religious services and are infrequent or of short duration when for other reasons). If current dressing causes regression in wound condition, may D/C ordered dressing product/s and apply Normal Saline Moist Dressing daily until next [place] / Other MD appointment. [place] of regression in wound condition at [phone]. Please direct any NON-WOUND related issues/requests for orders to patient's Primary Care Physician Consults ordered were: Infectious Disease The following medication(s) was prescribed: doxycycline hyclate oral 100 mg capsule 1 1 capsule oral taken 2 times a day for 14 days. Patient to stop the vitamins while taking this medication starting 10/18/2018 My suggestion at this point is going to be that we go ahead and see about checking the C-reactive protein as well as the sedimentation rate and again Grayland Ormond [name] is going to see about adding this to the other lab work which was strong today if not the patient will go back for additional blood draws to get this checked. Subsequently we're gonna see about getting touch with Dr. Henreitta Leber office I'll see about sending this note as well so that he will have an update on what we're seeing and what is going on. Nonetheless I do not think that the snap vac would be appropriate to reapply at this time that may be something for the future but right now I don't think that's going to help the patient. This was discussed with her and her daughter. Subsequently I think that switching to doxycycline which I did send in for her today as well to the pharmacy is appropriate she will discontinue the Flex this will be until she sees [name] and will see what he has to say the patient really does not want to go back on the linezolid  unless she absolutely has to. We will see were things stand in one weeks time. I specifically spoke with the patient and her daughter about the fact that if she developed any fevers, chills, nausea, vomiting, or diarrhea or even a worsening of the general malaise that she should go to the ER as soon as possible for possible IV antibiotics to be started. They are aware of what to look for. Especially common up on the  weekend. Please see above for specific wound care orders. We will see patient for re-evaluation in 1 week(s) here in the clinic. If anything worsens or changes patient will contact our office for additional recommendations. Electronic Signature(s) Signed: 10/18/2018 2:24:17 PM By: Worthy Keeler PA-C Entered By: Worthy Keeler on 10/18/2018 14:23:05 Stanco, [name] ([id number]) -------------------------------------------------------------------------------- ROS/PFSH Details Patient Name: [name] Date of Service: 10/18/2018 12:30 PM Medical Record Number: [id number] Patient Account Number: [id number] Date of Birth/Sex: 1923/11/16 (84 y.o. F) Treating RN: Montey Hora Primary Care Provider: Grayland Ormond Other Clinician: Referring Provider: Grayland Ormond Treating Provider/Extender: Melburn Hake, HOYT Weeks in Treatment: 71 Information Obtained From Patient Wound History Do you currently have one or more open woundso Yes How many open wounds do you currently haveo 1 Approximately how long have you had your woundso 2 yrs How have you been treating your wound(s) until nowo mupirocin, soaking in epsom salt Has your wound(s) ever healed and then re-openedo No Have you had any lab work done in the past montho No Have you tested positive for an antibiotic resistant organism (MRSA, VRE)o No Have you tested positive for osteomyelitis (bone infection)o No Have you had any tests for circulation on your legso Yes Who ordered the testo Dr. Lucky Cowboy [facility] was the test  [facility] Constitutional Symptoms (General Health) Complaints and Symptoms: Negative for: Fever; Chills Eyes Medical History: Positive for: Cataracts - surgery Respiratory Complaints and Symptoms: No Complaints or Symptoms Cardiovascular Complaints and Symptoms: No Complaints or Symptoms Medical History: Positive for: Congestive Heart Failure; Hypertension Musculoskeletal Medical History: Positive for: Osteoarthritis Neurologic Medical History: Positive for: Neuropathy Oncologic TAVIE, HASEMAN ([id number]) Medical History: Negative for: Received Chemotherapy; Received Radiation Psychiatric Complaints and Symptoms: No Complaints or Symptoms HBO Extended History Items Eyes: Cataracts Immunizations Pneumococcal Vaccine: Received Pneumococcal Vaccination: Yes Implantable Devices Family and Social History Cancer: Yes - Father,Siblings; Diabetes: No; Heart Disease: Yes - Siblings; Hereditary Spherocytosis: No; Hypertension: No; Kidney Disease: No; Lung Disease: No; Seizures: No; Stroke: No; Thyroid Problems: No; Tuberculosis: No; Never smoker; Marital Status - Married; Alcohol Use: Never; Drug Use: No History; Caffeine Use: Rarely; Financial Concerns: No; Food, Clothing or Shelter Needs: No; Support System Lacking: No; Transportation Concerns: No; Advanced Directives: No; Patient does not want information on Advanced Directives; Do not resuscitate: No; Living Will: Yes (Not Provided); Medical Power of Attorney: No Physician Affirmation I have reviewed and agree with the above information. Electronic Signature(s) Signed: 10/18/2018 2:24:17 PM By: Worthy Keeler PA-C Signed: 10/18/2018 5:06:38 PM By: Montey Hora Entered By: Worthy Keeler on 10/18/2018 14:19:16 [place]addell, [name] ([id number]) -------------------------------------------------------------------------------- SuperBill Details Patient Name: [name] Date of Service: 10/18/2018 Medical  Record Number: [id number] Patient Account Number: [id number] Date of Birth/Sex: 08-11-1923 (84 y.o. 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 I70.243 Atherosclerosis of native arteries of left leg with ulceration of ankle I89.0 Lymphedema, not elsewhere classified B35.4 Tinea corporis 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, left ankle and foot Facility Procedures CPT4 Code: [id number] Description: 99214 - WOUND CARE VISIT-LEV 4 EST PT Modifier: Quantity: 1 Physician Procedures CPT4 Code:  [id number] Description: 99214 - WC PHYS LEVEL 4 - EST PT ICD-10 Diagnosis Description 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, stage 4 L89.524 Pressure ulcer of left ankle, stage 4 S90.822A Blister (nonthermal), left foot, initial encounter J12.162 Pressure ulcer of  left heel, stage 2 Modifier: Quantity: 1 Electronic Signature(s) Signed: 10/18/2018 2:24:17 PM By: Worthy Keeler PA-C Entered By: Worthy Keeler on 10/18/2018 14:23:20

## 2018-10-19 NOTE — Progress Notes (Signed)
[name], [name] ([id number]) Visit Report for 10/18/2018 Arrival Information Details Patient Name: [name] Date of Service: 10/18/2018 12:30 PM Medical Record Number: [id number] Patient Account Number: [id number] Date of Birth/Sex: 1923-09-17 (84 y.o. F) Treating RN: Secundino Ginger Primary Care Wang [place]: Grayland Ormond Other Clinician: Referring Shade Kaley: Grayland Ormond Treating Rayshard Schirtzinger/Extender: Melburn Hake, HOYT Weeks in Treatment: 33 Visit Information History Since Last Visit Added or deleted any medications: No Patient Arrived: Ambulatory Any new allergies or adverse reactions: No Arrival Time: 12:38 Had a fall or experienced change in No Accompanied By: daughter activities of daily living that may affect Transfer Assistance: None risk of falls: Patient Identification Verified: Yes Signs or symptoms of abuse/neglect since last visito No Secondary Verification Process Yes Hospitalized since last visit: No Completed: Implantable device outside of the clinic excluding No Patient Requires Transmission-Based No cellular tissue based products placed in the center Precautions: since last visit: Patient Has Alerts: Yes Has Dressing in Place as Prescribed: Yes Patient Alerts: ABI AVVS 03/29/18 L Pain Present Now: No 1.05 R 1.03, TBI L .56, R .85 ABI AVVS 07/23/18 L .95 R 1.26 Electronic Signature(s) Signed: 10/18/2018 4:46:57 PM By: Secundino Ginger Entered By: Secundino Ginger on 10/18/2018 12:41:41 [name], [name] ([id number]) -------------------------------------------------------------------------------- Clinic Level of Care Assessment Details Patient Name: [name] Date of Service: 10/18/2018 12:30 PM Medical Record Number: [id number] Patient Account Number: [id number] Date of Birth/Sex: 09-16-1923 (84 y.o. F) Treating RN: Montey Hora Primary Care Nomar Broad: Grayland Ormond Other Clinician: Referring Angeline Trick: Grayland Ormond Treating Dequincy Born/Extender:  Melburn Hake, HOYT Weeks in Treatment: 22 Clinic Level of Care Assessment Items TOOL 4 Quantity Score []  - Use when only an EandM is performed on FOLLOW-UP visit 0 ASSESSMENTS - Nursing Assessment / Reassessment X - Reassessment of Co-morbidities (includes updates in patient status) 1 10 X- 1 5 Reassessment of Adherence to Treatment Plan ASSESSMENTS - Wound and Skin Assessment / Reassessment []  - Simple Wound Assessment / Reassessment - one wound 0 X- 3 5 Complex Wound Assessment / Reassessment - multiple wounds []  - 0 Dermatologic / Skin Assessment (not related to wound area) ASSESSMENTS - Focused Assessment []  - Circumferential Edema Measurements - multi extremities 0 []  - 0 Nutritional Assessment / Counseling / Intervention X- 1 5 Lower Extremity Assessment (monofilament, tuning fork, pulses) []  - 0 Peripheral Arterial Disease Assessment (using hand held doppler) ASSESSMENTS - Ostomy and/or Continence Assessment and Care []  - Incontinence Assessment and Management 0 []  - 0 Ostomy Care Assessment and Management (repouching, etc.) PROCESS - Coordination of Care X - Simple Patient / Family Education for ongoing care 1 15 []  - 0 Complex (extensive) Patient / Family Education for ongoing care []  - 0 Staff obtains Programmer, systems, Records, Test Results / Process Orders []  - 0 Staff telephones HHA, Nursing Homes / Clarify orders / etc []  - 0 Routine Transfer to another Facility (non-emergent condition) []  - 0 Routine Hospital Admission (non-emergent condition) []  - 0 New Admissions / Biomedical engineer / Ordering NPWT, Apligraf, etc. []  - 0 Emergency Hospital Admission (emergent condition) X- 1 10 Simple Discharge Coordination [name] ([id number]) []  - 0 Complex (extensive) Discharge Coordination PROCESS - Special Needs []  - Pediatric / Minor Patient Management 0 []  - 0 Isolation Patient Management []  - 0 Hearing / Language / Visual special needs []  -  0 Assessment of Community assistance (transportation, D/C planning, etc.) []  - 0 Additional assistance / Altered mentation []  - 0 Support Surface(s) Assessment (bed, cushion, seat, etc.) INTERVENTIONS -  Wound Cleansing / Measurement []  - Simple Wound Cleansing - one wound 0 X- 3 5 Complex Wound Cleansing - multiple wounds X- 1 5 Wound Imaging (photographs - any number of wounds) []  - 0 Wound Tracing (instead of photographs) []  - 0 Simple Wound Measurement - one wound X- 3 5 Complex Wound Measurement - multiple wounds INTERVENTIONS - Wound Dressings X - Small Wound Dressing one or multiple wounds 3 10 []  - 0 Medium Wound Dressing one or multiple wounds []  - 0 Large Wound Dressing one or multiple wounds []  - 0 Application of Medications - topical []  - 0 Application of Medications - injection INTERVENTIONS - Miscellaneous []  - External ear exam 0 []  - 0 Specimen Collection (cultures, biopsies, blood, body fluids, etc.) []  - 0 Specimen(s) / Culture(s) sent or taken to Lab for analysis []  - 0 Patient Transfer (multiple staff / Civil Service fast streamer / Similar devices) []  - 0 Simple Staple / Suture removal (25 or less) []  - 0 Complex Staple / Suture removal (26 or more) []  - 0 Hypo / Hyperglycemic Management (close monitor of Blood Glucose) []  - 0 Ankle / Brachial Index (ABI) - do not check if billed separately X- 1 5 Vital Signs [name] ([id number]) Has the patient been seen at the hospital within the last three years: Yes Total Score: 130 Level Of Care: New/Established - Level 4 Electronic Signature(s) Signed: 10/18/2018 5:06:38 PM By: Montey Hora Entered By: Montey Hora on 10/18/2018 13:44:01 [name] ([id number]) -------------------------------------------------------------------------------- Encounter Discharge Information Details Patient Name: [name] Date of Service: 10/18/2018 12:30 PM Medical Record Number: [id number] Patient  Account Number: [id number] Date of Birth/Sex: 04/25/1923 (84 y.o. F) Treating RN: Montey Hora Primary Care Murle Hellstrom: Grayland Ormond Other Clinician: Referring Inez Rosato: Grayland Ormond Treating Justin Buechner/Extender: Melburn Hake, HOYT Weeks in Treatment: 53 Encounter Discharge Information Items Discharge Condition: Stable Ambulatory Status: Walker Discharge Destination: Home Transportation: Private Auto Accompanied By: daughter Schedule Follow-up Appointment: Yes Clinical Summary of Care: Electronic Signature(s) Signed: 10/18/2018 5:06:38 PM By: Montey Hora Entered By: Montey Hora on 10/18/2018 13:46:00 [place], [name] ([id number]) -------------------------------------------------------------------------------- Lower Extremity Assessment Details Patient Name: [name] Date of Service: 10/18/2018 12:30 PM Medical Record Number: [id number] Patient Account Number: [id number] Date of Birth/Sex: 12/11/1922 (84 y.o. F) Treating RN: Secundino Ginger Primary Care Marilla Boddy: Grayland Ormond Other Clinician: Referring Deira Shimer: Grayland Ormond Treating Ayliana Casciano/Extender: Melburn Hake, HOYT Weeks in Treatment: 62 Edema Assessment Assessed: [Left: No] [Right: No] Edema: [Left: Yes] [Right: Yes] Calf Left: Right: Point of Measurement: 29 cm From Medial Instep 33 cm 32 cm Ankle Left: Right: Point of Measurement: 10 cm From Medial Instep 22 cm 23 cm Vascular Assessment Claudication: Claudication Assessment [Left:None] [Right:None] Pulses: Dorsalis Pedis Palpable: [Left:Yes] [Right:Yes] Posterior Tibial Extremity colors, hair growth, and conditions: Extremity Color: [Left:Hyperpigmented] [Right:Hyperpigmented] Temperature of Extremity: [Left:Warm] [Right:Warm] Capillary Refill: [Left:< 3 seconds] [Right:< 3 seconds] Toe Nail Assessment Left: Right: Thick: Yes Yes Discolored: Yes Yes Deformed: Yes Yes Improper Length and Hygiene: No No Electronic Signature(s) Signed:  10/18/2018 4:46:57 PM By: Secundino Ginger Entered By: Secundino Ginger on 10/18/2018 12:57:39 Dawn, [name] ([id number]) -------------------------------------------------------------------------------- Multi Wound Chart Details Patient Name: [name] Date of Service: 10/18/2018 12:30 PM Medical Record Number: [id number] Patient Account Number: [id number] Date of Birth/Sex: [birth year] (84 y.o. F) Treating RN: Montey Hora Primary Care Shianne Zeiser: Grayland Ormond Other Clinician: Referring Taffie Eckmann: Grayland Ormond Treating Beva Remund/Extender: STONE III, HOYT Weeks in Treatment: 62 Vital Signs Height(in): 65 Pulse(bpm): 87 Weight(lbs): 154.3  Blood Pressure(mmHg): 125/61 Body Mass Index(BMI): 26 Temperature(F): 98.1 Respiratory Rate 16 (breaths/min): Photos: [1:No Photos] [2:No Photos] [3:No Photos] Wound Location: [1:Right Malleolus - Lateral] [2:Left Malleolus - Lateral] [3:Left Calcaneus] Wounding Event: [1:Gradually Appeared] [2:Gradually Appeared] [3:Gradually Appeared] Primary Etiology: [1:Pressure Ulcer] [2:Pressure Ulcer] [3:Pressure Ulcer] Secondary Etiology: [1:Arterial Insufficiency Ulcer] [2:Arterial Insufficiency Ulcer] [3:N/A] Comorbid History: [1:Cataracts, Congestive Heart Failure, Hypertension, Osteoarthritis, Neuropathy] [2:Cataracts, Congestive Heart Failure, Hypertension, Osteoarthritis, Neuropathy] [3:Cataracts, Congestive Heart Failure, Hypertension, Osteoarthritis,  Neuropathy] Date Acquired: [1:08/11/2015] [2:04/12/2018] [3:10/04/2018] Weeks of Treatment: [1:62] [2:26] [3:1] Wound Status: [1:Open] [2:Open] [3:Open] Measurements L x W x D [1:0.4x0.4x0.1] [2:1.4x1x0.6] [3:2.2x2.4x0.1] (cm) Area (cm) : [1:0.126] [2:1.1] [3:4.147] Volume (cm) : [1:0.013] [2:0.66] [3:0.415] % Reduction in Area: [1:91.80%] [2:-40.10%] [3:-50.90%] % Reduction in Volume: [1:97.20%] [2:-735.40%] [3:24.50%] Classification: [1:Category/Stage IV] [2:Category/Stage IV]  [3:Category/Stage II] Exudate Amount: [1:Small] [2:Medium] [3:Large] Exudate Type: [1:Serous] [2:Serous] [3:Serous] Exudate Color: [1:amber] [2:amber] [3:amber] Wound Margin: [1:Distinct, outline attached] [2:Epibole] [3:Indistinct, nonvisible] Granulation Amount: [1:None Present (0%)] [2:Small (1-33%)] [3:None Present (0%)] Granulation Quality: [1:N/A] [2:Pink, Pale] [3:N/A] Necrotic Amount: [1:Medium (34-66%)] [2:Large (67-100%)] [3:Small (1-33%)] Necrotic Tissue: [1:Adherent Slough] [2:Adherent Slough] [3:Eschar, Adherent Slough] Exposed Structures: [1:Fat Layer (Subcutaneous Tissue) Exposed: Yes Fascia: No Tendon: No Muscle: No Joint: No Bone: No] [2:Fat Layer (Subcutaneous Tissue) Exposed: Yes Fascia: No Tendon: No Muscle: No Joint: No Bone: No] [3:Fascia: No Fat Layer (Subcutaneous Tissue)  Exposed: No Tendon: No Muscle: No Joint: No Bone: No Limited to Skin Breakdown] Epithelialization: [1:Medium (34-66%)] [2:None] [3:None] Periwound Skin Texture: [1:Excoriation: No Induration: No] [2:Excoriation: No Induration: No] [3:Excoriation: No Induration: No] Callus: No Callus: No Callus: No Crepitus: No Crepitus: No Crepitus: No Rash: No Rash: No Rash: No Scarring: No Scarring: No Scarring: No Periwound Skin Moisture: Maceration: No Maceration: Yes Maceration: Yes Dry/Scaly: No Dry/Scaly: No Dry/Scaly: No Periwound Skin Color: Rubor: Yes Atrophie Blanche: No Atrophie Blanche: No Atrophie Blanche: No Cyanosis: No Cyanosis: No Cyanosis: No Ecchymosis: No Ecchymosis: No Ecchymosis: No Erythema: No Erythema: No Erythema: No Hemosiderin Staining: No Hemosiderin Staining: No Hemosiderin Staining: No Mottled: No Mottled: No Mottled: No Pallor: No Pallor: No Pallor: No Rubor: No Rubor: No Temperature: No Abnormality No Abnormality N/A Tenderness on Palpation: No Yes No Wound Preparation: Ulcer Cleansing: Ulcer Cleansing: Ulcer Cleansing: Rinsed/Irrigated with  Saline Rinsed/Irrigated with Saline Rinsed/Irrigated with Saline Topical Anesthetic Applied: Topical Anesthetic Applied: Topical Anesthetic Applied: Other: lidocaine 4% Other: lidocaine 4% Other: lidocaine 4% Treatment Notes Electronic Signature(s) Signed: 10/18/2018 5:06:38 PM By: Montey Hora Entered By: Montey Hora on 10/18/2018 13:11:26 Verma, [name] ([id number]) -------------------------------------------------------------------------------- [place] Details Patient Name: [name] Date of Service: 10/18/2018 12:30 PM Medical Record Number: [id number] Patient Account Number: [id number] Date of Birth/Sex: 1923-06-11 (84 y.o. F) Treating RN: Montey Hora Primary Care Krysta Bloomfield: Grayland Ormond Other Clinician: Referring Filimon Miranda: Grayland Ormond Treating Tzion Wedel/Extender: Melburn Hake, HOYT Weeks in Treatment: 30 Active Inactive ` Abuse / Safety / Falls / Self Care Management Nursing Diagnoses: Potential for falls Goals: Patient will not experience any injury related to falls Date Initiated: 08/10/2017 Target Resolution Date: 11/10/2017 Goal Status: Active Interventions: Assess Activities of Daily Living upon admission and as needed Assess fall risk on admission and as needed Assess: immobility, friction, shearing, incontinence upon admission and as needed Notes: ` Nutrition Nursing Diagnoses: Imbalanced nutrition Potential for alteratiion in Nutrition/Potential for imbalanced nutrition Goals: Patient/caregiver agrees to and verbalizes understanding of need to use nutritional supplements and/or vitamins as prescribed Date Initiated: 08/10/2017 Target Resolution Date: 12/08/2017 Goal Status:  Active Interventions: Assess patient nutrition upon admission and as needed per policy Notes: ` Orientation to the Wound Care Program Nursing Diagnoses: Knowledge deficit related to the wound healing center program Goals: Patient/caregiver  will verbalize understanding of the [place] Program Date Initiated: 08/10/2017 Target Resolution Date: 09/08/2017 TECKLA, CHRISTIANSEN ([id number]) Goal Status: Active Interventions: Provide education on orientation to the wound center Notes: ` Pain, Acute or Chronic Nursing Diagnoses: Pain, acute or chronic: actual or potential Potential alteration in comfort, pain Goals: Patient/caregiver will verbalize adequate pain control between visits Date Initiated: 08/10/2017 Target Resolution Date: 12/08/2017 Goal Status: Active Interventions: Complete pain assessment as per visit requirements Notes: ` Wound/Skin Impairment Nursing Diagnoses: Impaired tissue integrity Knowledge deficit related to ulceration/compromised skin integrity Goals: Ulcer/skin breakdown will have a volume reduction of 80% by week 12 Date Initiated: 08/10/2017 Target Resolution Date: 12/01/2017 Goal Status: Active Interventions: Assess patient/caregiver ability to perform ulcer/skin care regimen upon admission and as needed Notes: Electronic Signature(s) Signed: 10/18/2018 5:06:38 PM By: Montey Hora Entered By: Montey Hora on 10/18/2018 13:11:04 Fullard, [name] ([id number]) -------------------------------------------------------------------------------- Pain Assessment Details Patient Name: [name] Date of Service: 10/18/2018 12:30 PM Medical Record Number: [id number] Patient Account Number: [id number] Date of Birth/Sex: 1922/12/07 (84 y.o. F) Treating RN: Secundino Ginger Primary Care Tatiana Courter: Grayland Ormond Other Clinician: Referring Carolyna Yerian: Grayland Ormond Treating Valicia Rief/Extender: Melburn Hake, HOYT Weeks in Treatment: 39 Active Problems Location of Pain Severity and Description of Pain Patient Has Paino Yes Site Locations Pain Location: Pain in Ulcers Rate the pain. Current Pain Level: 5 Pain Management and Medication Current Pain Management: Goals for Pain  Management pt c/o intermittent pain burning and "hurts". Electronic Signature(s) Signed: 10/18/2018 4:46:57 PM By: Secundino Ginger Entered By: Secundino Ginger on 10/18/2018 12:43:23 [place]sch[place], [name] ([id number]) -------------------------------------------------------------------------------- Patient/Caregiver Education Details Patient Name: [name] Date of Service: 10/18/2018 12:30 PM Medical Record Number: [id number] Patient Account Number: [id number] Date of Birth/Gender: Aug 06, 1923 (84 y.o. F) Treating RN: Montey Hora Primary Care Physician: Grayland Ormond Other Clinician: Referring Physician: Grayland Ormond Treating Physician/Extender: Sharalyn Ink in Treatment: 17 Education Assessment Education Provided To: Patient and Caregiver Education Topics Provided Offloading: Handouts: Other: offloading heels and ankles Methods: Explain/Verbal Responses: State content correctly Electronic Signature(s) Signed: 10/18/2018 5:06:38 PM By: Montey Hora Entered By: Montey Hora on 10/18/2018 13:44:33 Chivers, [name] ([id number]) -------------------------------------------------------------------------------- Wound Assessment Details Patient Name: [name] Date of Service: 10/18/2018 12:30 PM Medical Record Number: [id number] Patient Account Number: [id number] Date of Birth/Sex: 1923/03/01 (84 y.o. F) Treating RN: Secundino Ginger Primary Care Lemya Greenwell: Grayland Ormond Other Clinician: Referring Jacarri Gesner: Grayland Ormond Treating Ashlay Altieri/Extender: STONE III, HOYT Weeks in Treatment: 62 Wound Status Wound Number: 1 Primary Pressure Ulcer Etiology: Wound Location: Right Malleolus - Lateral Secondary Arterial Insufficiency Ulcer Wounding Event: Gradually Appeared Etiology: Date Acquired: 08/11/2015 Wound Status: Open Weeks Of Treatment: 62 Comorbid Cataracts, Congestive Heart Failure, Clustered Wound: No History: Hypertension, Osteoarthritis,  Neuropathy Photos Photo Uploaded By: Secundino Ginger on 10/18/2018 16:38:45 Wound Measurements Length: (cm) 0.4 Width: (cm) 0.4 Depth: (cm) 0.1 Area: (cm) 0.126 Volume: (cm) 0.013 % Reduction in Area: 91.8% % Reduction in Volume: 97.2% Epithelialization: Medium (34-66%) Tunneling: No Undermining: No Wound Description Classification: Category/Stage IV Foul Odor Wound Margin: Distinct, outline attached Slough/Fib Exudate Amount: Small Exudate Type: Serous Exudate Color: amber After Cleansing: No rino Yes Wound Bed Granulation Amount: None Present (0%) Exposed Structure Necrotic Amount: Medium (34-66%) Fascia Exposed: No Necrotic Quality: Adherent Slough Fat Layer (Subcutaneous  Tissue) Exposed: Yes Tendon Exposed: No Muscle Exposed: No Joint Exposed: No Bone Exposed: No Periwound Skin Texture Oliveira, [name] ([id number]) Texture Color No Abnormalities Noted: No No Abnormalities Noted: No Callus: No Atrophie Blanche: No Crepitus: No Cyanosis: No Excoriation: No Ecchymosis: No Induration: No Erythema: No Rash: No Hemosiderin Staining: No Scarring: No Mottled: No Pallor: No Moisture Rubor: Yes No Abnormalities Noted: No Dry / Scaly: No Temperature / Pain Maceration: No Temperature: No Abnormality Wound Preparation Ulcer Cleansing: Rinsed/Irrigated with Saline Topical Anesthetic Applied: Other: lidocaine 4%, Treatment Notes Wound #1 (Right, Lateral Malleolus) Notes right - prisma and bordered foam dressing, left - silvercel and bordered foam Electronic Signature(s) Signed: 10/18/2018 4:46:57 PM By: Secundino Ginger Entered By: Secundino Ginger on 10/18/2018 12:50:39 Simms, [name] ([id number]) -------------------------------------------------------------------------------- Wound Assessment Details Patient Name: [name] Date of Service: 10/18/2018 12:30 PM Medical Record Number: [id number] Patient Account Number: [id number] Date of Birth/Sex:  Sep 17, 1923 (84 y.o. F) Treating RN: Secundino Ginger Primary Care Yidel Teuscher: Grayland Ormond Other Clinician: Referring Chilton Sallade: Grayland Ormond Treating Kaitrin Seybold/Extender: STONE III, HOYT Weeks in Treatment: 62 Wound Status Wound Number: 2 Primary Pressure Ulcer Etiology: Wound Location: Left Malleolus - Lateral Secondary Arterial Insufficiency Ulcer Wounding Event: Gradually Appeared Etiology: Date Acquired: 04/12/2018 Wound Status: Open Weeks Of Treatment: 26 Comorbid Cataracts, Congestive Heart Failure, Clustered Wound: No History: Hypertension, Osteoarthritis, Neuropathy Photos Photo Uploaded By: Secundino Ginger on 10/18/2018 16:38:46 Wound Measurements Length: (cm) 1.4 % Reductio Width: (cm) 1 % Reductio Depth: (cm) 0.6 Epithelial Area: (cm) 1.1 Tunneling Volume: (cm) 0.66 Undermini n in Area: -40.1% n in Volume: -735.4% ization: None : No ng: No Wound Description Classification: Category/Stage IV Foul Odor Wound Margin: Epibole Slough/Fib Exudate Amount: Medium Exudate Type: Serous Exudate Color: amber After Cleansing: No rino Yes Wound Bed Granulation Amount: Small (1-33%) Exposed Structure Granulation Quality: Pink, Pale Fascia Exposed: No Necrotic Amount: Large (67-100%) Fat Layer (Subcutaneous Tissue) Exposed: Yes Necrotic Quality: Adherent Slough Tendon Exposed: No Muscle Exposed: No Joint Exposed: No Bone Exposed: No Periwound Skin Texture Feagan, [name] ([id number]) Texture Color No Abnormalities Noted: No No Abnormalities Noted: No Callus: No Atrophie Blanche: No Crepitus: No Cyanosis: No Excoriation: No Ecchymosis: No Induration: No Erythema: No Rash: No Hemosiderin Staining: No Scarring: No Mottled: No Pallor: No Moisture Rubor: No No Abnormalities Noted: No Dry / Scaly: No Temperature / Pain Maceration: Yes Temperature: No Abnormality Tenderness on Palpation: Yes Wound Preparation Ulcer Cleansing: Rinsed/Irrigated with  Saline Topical Anesthetic Applied: Other: lidocaine 4%, Treatment Notes Wound #2 (Left, Lateral Malleolus) Notes right - prisma and bordered foam dressing, left - silvercel and bordered foam Electronic Signature(s) Signed: 10/18/2018 4:46:57 PM By: Secundino Ginger Entered By: Secundino Ginger on 10/18/2018 12:53:37 Follett, [name] ([id number]) -------------------------------------------------------------------------------- Wound Assessment Details Patient Name: [name] Date of Service: 10/18/2018 12:30 PM Medical Record Number: [id number] Patient Account Number: [id number] Date of Birth/Sex: 07/21/1923 (84 y.o. F) Treating RN: Secundino Ginger Primary Care Trayshawn Durkin: Grayland Ormond Other Clinician: Referring Wandalene Abrams: Grayland Ormond Treating Loyd Salvador/Extender: STONE III, HOYT Weeks in Treatment: 62 Wound Status Wound Number: 3 Primary Pressure Ulcer Etiology: Wound Location: Left Calcaneus Wound Open Wounding Event: Gradually Appeared Status: Date Acquired: 10/04/2018 Comorbid Cataracts, Congestive Heart Failure, Weeks Of Treatment: 1 History: Hypertension, Osteoarthritis, Neuropathy Clustered Wound: No Photos Photo Uploaded By: Gretta Cool, BSN, RN, CWS, Kim on 10/18/2018 17:16:48 Wound Measurements Length: (cm) 2.2 Width: (cm) 2.4 Depth: (cm) 0.1 Area: (cm) 4.147 Volume: (cm) 0.415 % Reduction in Area: -50.9% % Reduction in  Volume: 24.5% Epithelialization: None Wound Description Classification: Category/Stage II Foul Odor Wound Margin: Indistinct, nonvisible Slough/Fi Exudate Amount: Large Exudate Type: Serous Exudate Color: amber After Cleansing: No brino No Wound Bed Granulation Amount: None Present (0%) Exposed Structure Necrotic Amount: Small (1-33%) Fascia Exposed: No Necrotic Quality: Eschar, Adherent Slough Fat Layer (Subcutaneous Tissue) Exposed: No Tendon Exposed: No Muscle Exposed: No Joint Exposed: No Bone Exposed: No Limited to Skin  Breakdown Periwound Skin Texture Buist, [name] ([id number]) Texture Color No Abnormalities Noted: No No Abnormalities Noted: No Callus: No Atrophie Blanche: No Crepitus: No Cyanosis: No Excoriation: No Ecchymosis: No Induration: No Erythema: No Rash: No Hemosiderin Staining: No Scarring: No Mottled: No Pallor: No Moisture Rubor: No No Abnormalities Noted: No Dry / Scaly: No Maceration: Yes Wound Preparation Ulcer Cleansing: Rinsed/Irrigated with Saline Topical Anesthetic Applied: Other: lidocaine 4%, Treatment Notes Wound #3 (Left Calcaneus) Notes right - prisma and bordered foam dressing, left - silvercel and bordered foam Electronic Signature(s) Signed: 10/18/2018 4:46:57 PM By: Secundino Ginger Entered By: Secundino Ginger on 10/18/2018 12:56:04 [place], [name] ([id number]) -------------------------------------------------------------------------------- [place] Details Patient Name: [name] Date of Service: 10/18/2018 12:30 PM Medical Record Number: [id number] Patient Account Number: [id number] Date of Birth/Sex: 11/11/1923 (84 y.o. F) Treating RN: Secundino [facility] Miniya Miguez: Grayland Ormond Other Clinician: Referring Jaeley Wiker: Grayland Ormond Treating Juliana Boling/Extender: Melburn Hake, HOYT Weeks in Treatment: 62 Vital Signs Time Taken: 12:43 Temperature (F): 98.1 Height (in): 65 Pulse (bpm): 87 Weight (lbs): 154.3 Respiratory Rate (breaths/min): 16 Body Mass Index (BMI): 25.7 Blood Pressure (mmHg): 125/61 Reference Range: 80 - 120 mg / dl Electronic Signature(s) Signed: 10/18/2018 4:46:57 PM By: Secundino Ginger Entered By: Secundino Ginger on 10/18/2018 12:43:54

## 2018-10-23 ENCOUNTER — Telehealth: Payer: Self-pay

## 2018-10-23 ENCOUNTER — Encounter: Payer: Self-pay | Admitting: Internal Medicine

## 2018-10-23 ENCOUNTER — Other Ambulatory Visit: Payer: Self-pay | Admitting: Behavioral Health

## 2018-10-23 ENCOUNTER — Ambulatory Visit (INDEPENDENT_AMBULATORY_CARE_PROVIDER_SITE_OTHER): Payer: Medicare Other | Admitting: Internal Medicine

## 2018-10-23 ENCOUNTER — Telehealth (INDEPENDENT_AMBULATORY_CARE_PROVIDER_SITE_OTHER): Payer: Self-pay | Admitting: Vascular Surgery

## 2018-10-23 VITALS — BP 135/80 | HR 76 | Temp 98.2°F | Ht 66.0 in | Wt 148.0 lb

## 2018-10-23 DIAGNOSIS — M86672 Other chronic osteomyelitis, left ankle and foot: Secondary | ICD-10-CM | POA: Diagnosis not present

## 2018-10-23 DIAGNOSIS — Z452 Encounter for adjustment and management of vascular access device: Secondary | ICD-10-CM

## 2018-10-23 DIAGNOSIS — Z5181 Encounter for therapeutic drug level monitoring: Secondary | ICD-10-CM

## 2018-10-23 NOTE — Telephone Encounter (Addendum)
Appointment for Picc line placement scheduled for 10/25/18 on 8am. Patient and daughter made aware. Orders placed in epic by [name]  [facility] received orders via fax and new referral sent via email.

## 2018-10-23 NOTE — Progress Notes (Signed)
Patient ID: [name], female   DOB: 20-Feb-1923, 84 y.o.   MRN: [id number]   Subjective:    Patient ID: [name], female    DOB: Jan 18, 1923, 84 y.o.   MRN: [id number]  HPI Here for follow up of ostemyelitis.  She was intially referred due to concern for osteomyelitis and ended up in the hospital with non-healing left lateral malleolus ulcer.  A recent bone culture done at wound care by Dr. Grayling Congress with MSSA.  In the hospital she was noted to have cellulitis and swelling and put on IV cefazolin and improved some and sent out on Keflex.  She though has continued the Augmentin since she still had it.  She underwent a CT evaluation of the left malleolus which was concerning for osteomyleitis with lytic areas on the bone and exposure of the bone.  She is known to have poor vasculature overall.   I started her on Zyvox which she took for 2 weeks.  She has significant side effects including some loose stools, less than 5/day, significant fatigue and malaise she feelt was associated with her antibiotic.   I then started her on Keflex and took for about 1 month.  Her labs had been reasurring and ulcer healing and stopped antibiotics.  She though is back today with continued non-healing ulcer that probes to bone of the left lateral malleolus.  She also has been on doxycycline most recently.  She has a hsitory of MSSA culture as well as Pseudomonas, pansensitive.  She also sees vascular surgery and has an appt in February.  Recently had general malaise with a mildly elevated WBC but is improved.    ROS: no fever    Objective:    PE:  Constitutional: alert, nad Vitals:   10/23/18 1113  Weight: 148 lb (67.1 kg)  Height: 5\' 6"  (1.676 m)  CV: RRR Lungs: CTA B; normal respiraatory effort MS: left leg lateral malleolus with open ulcer, some drainage; left foot ulcer over heal Skin : no rashes  SH: lives in Assisted living facility    Assessment & Plan:

## 2018-10-23 NOTE — Assessment & Plan Note (Signed)
With the ulcer probing to bone, history of cultures noted, I will start her on cefepime IV, 1 gram twice a day, renally dosed.   Will treat for 4 weeks and monitor She will return in 2-3 weeks and see if there is improvement, if not healing, I do not feel further antibiotics would be indicated

## 2018-10-23 NOTE — Assessment & Plan Note (Signed)
She will get labs monitored per home health

## 2018-10-23 NOTE — Addendum Note (Signed)
Addended by: Thayer Headings on: 10/23/2018 04:01 PM   Modules accepted: Orders

## 2018-10-23 NOTE — Assessment & Plan Note (Signed)
Will order picc line via IR at [facility]. Will give first dose after placement in short stay there.

## 2018-10-23 NOTE — Telephone Encounter (Signed)
Call placed to  Speciality scheduling at [facility]. Marcie Bal will call back after confirming if with IR team to schedule this week. Patient to receive first dose at short stay [facility] Cefepime 1 gram x1 dose per [name]. Patient available on Friday 10/25/18 afternoon.

## 2018-10-25 ENCOUNTER — Ambulatory Visit
Admission: RE | Admit: 2018-10-25 | Discharge: 2018-10-25 | Disposition: A | Discharge status: Home / Self Care | Payer: Medicare Other | Source: Ambulatory Visit | Attending: Internal Medicine | Admitting: Internal Medicine

## 2018-10-25 ENCOUNTER — Ambulatory Visit: Payer: Medicare Other | Admitting: Physician Assistant

## 2018-10-25 ENCOUNTER — Telehealth: Payer: Self-pay | Admitting: Behavioral Health

## 2018-10-25 ENCOUNTER — Telehealth: Payer: Self-pay | Admitting: *Deleted

## 2018-10-25 ENCOUNTER — Telehealth (INDEPENDENT_AMBULATORY_CARE_PROVIDER_SITE_OTHER): Payer: Self-pay

## 2018-10-25 DIAGNOSIS — M86672 Other chronic osteomyelitis, left ankle and foot: Secondary | ICD-10-CM | POA: Insufficient documentation

## 2018-10-25 DIAGNOSIS — Z452 Encounter for adjustment and management of vascular access device: Secondary | ICD-10-CM

## 2018-10-25 DIAGNOSIS — Z792 Long term (current) use of antibiotics: Secondary | ICD-10-CM

## 2018-10-25 DIAGNOSIS — Z95828 Presence of other vascular implants and grafts: Secondary | ICD-10-CM

## 2018-10-25 MED ORDER — IOPAMIDOL (ISOVUE-300) INJECTION 61%
50.0000 mL | Freq: Once | INTRAVENOUS | Status: AC | PRN
  Administered 2018-10-25: 1 mL via INTRAVENOUS

## 2018-10-25 MED ORDER — HEPARIN SOD (PORK) LOCK FLUSH 100 UNIT/ML IV SOLN
INTRAVENOUS | Status: AC
  Filled 2018-10-25: qty 5

## 2018-10-25 MED ORDER — SODIUM CHLORIDE FLUSH 0.9 % IV SOLN
INTRAVENOUS | Status: AC
  Filled 2018-10-25: qty 10

## 2018-10-25 MED ORDER — LIDOCAINE HCL (PF) 1 % IJ SOLN
INTRAMUSCULAR | Status: AC
  Filled 2018-10-25: qty 30

## 2018-10-25 MED ORDER — HEPARIN SOD (PORK) LOCK FLUSH 100 UNIT/ML IV SOLN: 250.0000 [IU] | INTRAVENOUS | Status: DC | PRN

## 2018-10-25 MED ORDER — SODIUM CHLORIDE 0.9 % IV SOLN
1.0000 g | Freq: Once | INTRAVENOUS | Status: AC
  Administered 2018-10-25: 1 g via INTRAVENOUS
  Filled 2018-10-25: qty 1

## 2018-10-25 NOTE — Telephone Encounter (Signed)
Per call from patient daughter called [facility] to verify if they are providing the medication for the patient new PICC. Spoke with Corretta and she verified that they are in fact doing the IV medication and [facility] is to continue the nursing for the patient. She is due for a delivery of medication today and request if comes by 6 and if at all possible they can call the cell number provided to make sure they are home.  Placed call to let the daughter know as she was not sure and wanted to know.

## 2018-10-25 NOTE — Progress Notes (Signed)
Antibiotic adm. Via PICC line right arm , pt. Tolerated well , SASH protocol.

## 2018-10-25 NOTE — Telephone Encounter (Signed)
[facility]iology called Urgent results   IMPRESSION: 1. Right-sided PICC line with the PICC line coiled in right axillary vein. Recommend vascular surgery or interventional radiology consultation if there is difficulty in removing the catheter.  Spoke with Jenny Reichmann at [facility] where PICC was placed.  She states radiologist Dr. Posey Pronto was aware of the results (coiled PICC) and consulted IR to evaluate.  Plan is for Dr. Saunders Revel to attempt to reposition PICC however if this is unsuccessful they will replace the PICC.  Patient is currently still at [facility]

## 2018-10-25 NOTE — Progress Notes (Signed)
PICC placement chest xray showed catheter coiled, RN Jenny Reichmann will refer to IR. Will follow up.

## 2018-10-25 NOTE — Procedures (Signed)
Successful replacement of right arm PICC. Tip at SVC/RA junction and ready to use.  No blood loss and no immediate complication.

## 2018-10-25 NOTE — Progress Notes (Signed)
Peripherally Inserted Central Catheter/Midline Placement  The IV Nurse has discussed with the patient and/or persons authorized to consent for the patient, the purpose of this procedure and the potential benefits and risks involved with this procedure.  The benefits include less needle sticks, lab draws from the catheter, and the patient may be discharged home with the catheter. Risks include, but not limited to, infection, bleeding, blood clot (thrombus formation), and puncture of an artery; nerve damage and irregular heartbeat and possibility to perform a PICC exchange if needed/ordered by physician.  Alternatives to this procedure were also discussed.  Bard Power PICC patient education guide, fact sheet on infection prevention and patient information card has been provided to patient /or left at bedside.    PICC/Midline Placement Documentation  PICC Single Lumen 72/90/21 PICC Right Basilic 36 cm 0 cm (Active)  Indication for Insertion or Continuance of Line Home intravenous therapies (PICC only) 10/25/2018  9:00 AM  Exposed Catheter (cm) 0 cm 10/25/2018  9:00 AM  Site Assessment Clean;Dry;Intact 10/25/2018  9:00 AM  Line Status Other (Comment) 10/25/2018  9:00 AM  Dressing Type Transparent 10/25/2018  9:00 AM  Dressing Status Clean;Dry;Intact;Antimicrobial disc in place 10/25/2018  9:00 AM  Dressing Intervention New dressing 10/25/2018  9:00 AM  Dressing Change Due 11/01/18 10/25/2018  9:00 AM   Daughter signed consent at bedside.    [name] 10/25/2018, 9:55 AM

## 2018-10-26 ENCOUNTER — Telehealth: Payer: Self-pay | Admitting: Infectious Diseases

## 2018-10-26 NOTE — Telephone Encounter (Signed)
daugther called- pt's daughter called as she has not been seen by home health and they are concerned about missing anbx and getting pic flushed She will stay on oral doxy til seen by home health If they have not seen her by tomorrow, to go to ED to have cath flushed.

## 2018-10-28 ENCOUNTER — Ambulatory Visit (INDEPENDENT_AMBULATORY_CARE_PROVIDER_SITE_OTHER): Payer: Medicare Other | Admitting: Nurse Practitioner

## 2018-10-28 ENCOUNTER — Ambulatory Visit (INDEPENDENT_AMBULATORY_CARE_PROVIDER_SITE_OTHER): Payer: Medicare Other

## 2018-10-28 ENCOUNTER — Other Ambulatory Visit (INDEPENDENT_AMBULATORY_CARE_PROVIDER_SITE_OTHER): Payer: Self-pay | Admitting: Vascular Surgery

## 2018-10-28 ENCOUNTER — Encounter (INDEPENDENT_AMBULATORY_CARE_PROVIDER_SITE_OTHER): Payer: Self-pay | Admitting: Nurse Practitioner

## 2018-10-28 ENCOUNTER — Telehealth: Payer: Self-pay | Admitting: *Deleted

## 2018-10-28 VITALS — BP 125/69 | HR 81 | Temp 96.1°F | Resp 18 | Ht 64.0 in | Wt 155.0 lb

## 2018-10-28 DIAGNOSIS — L819 Disorder of pigmentation, unspecified: Secondary | ICD-10-CM | POA: Diagnosis not present

## 2018-10-28 DIAGNOSIS — L97329 Non-pressure chronic ulcer of left ankle with unspecified severity: Secondary | ICD-10-CM

## 2018-10-28 DIAGNOSIS — I7025 Atherosclerosis of native arteries of other extremities with ulceration: Secondary | ICD-10-CM | POA: Diagnosis not present

## 2018-10-28 DIAGNOSIS — L97319 Non-pressure chronic ulcer of right ankle with unspecified severity: Secondary | ICD-10-CM

## 2018-10-28 DIAGNOSIS — I70233 Atherosclerosis of native arteries of right leg with ulceration of ankle: Secondary | ICD-10-CM | POA: Diagnosis not present

## 2018-10-28 DIAGNOSIS — I70243 Atherosclerosis of native arteries of left leg with ulceration of ankle: Secondary | ICD-10-CM | POA: Diagnosis not present

## 2018-10-28 DIAGNOSIS — E785 Hyperlipidemia, unspecified: Secondary | ICD-10-CM | POA: Diagnosis not present

## 2018-10-28 DIAGNOSIS — M86672 Other chronic osteomyelitis, left ankle and foot: Secondary | ICD-10-CM | POA: Diagnosis not present

## 2018-10-28 NOTE — Telephone Encounter (Signed)
Patient daughter [name] called to advise Dr [name] that since her mother started taking the medication through the PICC she has decreased mobility and increased pain all over. She is concerned and wants to know if that is a side effect of the medication. Asked if there are any other symptoms she has noticed and she says none she has noticed. Advised her not sure but will ask the provider and let him know what is going on and give her a call back.

## 2018-10-28 NOTE — Progress Notes (Signed)
Subjective:    Patient ID: [name], female    DOB: [birth year], 84 y.o.   MRN: [id number] Chief Complaint  Patient presents with  . Follow-up    ABI f/u    HPI  [name] is a 84 y.o. female that presents today with her daughter with concern for darkening of her feet as well as a new heel ulceration.  The daughter stated that her heel ulceration suddenly occurred as a small red area 1 week.  The next week it appeared to have a blister, with a small opening the next week, and a full ulcer by week 4.  She states that they have found that she has osteomyelitis in the bone of the left heel.  She also has bilateral ulcerations on the lateral area of the ankle.  Her right lower extremity is nearly healed however the left has been lingering.  No specific history of trauma noted by the patient.  The patient denies fever or chills.  the patient does have diabetes.  The patient denies rest pain or dangling of an extremity off the side of the bed during the night for relief. Patient had prior angiogram on her right lower extremity on 10/09/2016.  No history of back problems or DJD of the lumbar sacral spine.   The patient denies amaurosis fugax or recent TIA symptoms. There are no recent neurological changes noted. The patient denies history of DVT, PE or superficial thrombophlebitis. The patient denies recent episodes of angina or shortness of breath.    The patient underwent bilateral ABIs which revealed an ABI of 1.34 on her right lower extremity and 0.80 on her left.  The TBI on the right is 0.64 with a TBI of 0.49 on the left.  Previous studies done on 07/23/2018 show a left ABI of 0.95 and a left TBI of 0.36.  The waveforms are triphasic on the right lower extremity with strong toe waveforms.  The waveforms on the left are reported as biphasic with weekend toe waveforms.  They have a more monophasic appearance however. Past Medical History:  Diagnosis Date  . Hypothyroidism     . Peripheral vascular disease ([place])   . Pneumonia   . Presence of permanent cardiac pacemaker    2010  . Skin cancer, basal cell    skin cancer   . Stroke ([place])   . Varicose veins     Past Surgical History:  Procedure Laterality Date  . APPENDECTOMY    . BASAL CELL CARCINOMA EXCISION Left   . BREAST CYST EXCISION Left   . CATARACT EXTRACTION Left   . CHOLECYSTECTOMY    . EYE SURGERY    . PACEMAKER PLACEMENT    . PERIPHERAL VASCULAR CATHETERIZATION Right 10/09/2016   Procedure: Lower Extremity Angiography;  Surgeon: Algernon Huxley, MD;  Location: [facility];  Service: Cardiovascular;  Laterality: Right;  . PERIPHERAL VASCULAR CATHETERIZATION  10/09/2016   Procedure: Lower Extremity Intervention;  Surgeon: Algernon Huxley, MD;  Location: [facility];  Service: Cardiovascular;;  . TONSILLECTOMY      Social History   Socioeconomic History  . Marital status: Widowed    Spouse name: Not on file  . Number of children: Not on file  . Years of education: Not on file  . Highest education level: Not on file  Occupational History  . Not on file  Social Needs  . Financial resource strain: Not on file  . Food insecurity:  Worry: Not on file    Inability: Not on file  . Transportation needs:    Medical: Not on file    Non-medical: Not on file  Tobacco Use  . Smoking status: Never Smoker  . Smokeless tobacco: Never Used  Substance and Sexual Activity  . Alcohol use: No  . Drug use: No  . Sexual activity: Not on file  Lifestyle  . Physical activity:    Days per week: Not on file    Minutes per session: Not on file  . Stress: Not on file  Relationships  . Social connections:    Talks on phone: Not on file    Gets together: Not on file    Attends religious service: Not on file    Active member of club or organization: Not on file    Attends meetings of clubs or organizations: Not on file    Relationship status: Not on file  . Intimate partner violence:     Fear of current or ex partner: Not on file    Emotionally abused: Not on file    Physically abused: Not on file    Forced sexual activity: Not on file  Other Topics Concern  . Not on file  Social History Narrative  . Not on file    Family History  Problem Relation Age of Onset  . Stroke Mother   . Cancer Father     Allergies  Allergen Reactions  . Chlorpheniramine     Other reaction(s): Unknown  . Fluconazole Rash  . Metronidazole Nausea And Vomiting, Other (See Comments) and Rash  . Monistat  [Tioconazole] Rash and Other (See Comments)  . Sulfa Antibiotics Rash and Nausea And Vomiting     Review of Systems   Review of Systems: Negative Unless Checked Constitutional: [] Weight loss  [] Fever  [] Chills Cardiac: [] Chest pain   [x]  Atrial Fibrillation  [] Palpitations   [] Shortness of breath when laying flat   [] Shortness of breath with exertion. Vascular:  [x] Pain in legs with walking   [x] Pain in legs with standing  [] History of DVT   [] Phlebitis   [] Swelling in legs   [x] Varicose veins   [] Non-healing ulcers Pulmonary:   [] Uses home oxygen   [] Productive cough   [] Hemoptysis   [] Wheeze  [] COPD   [] Asthma Neurologic:  [] Dizziness   [] Seizures   [x] History of stroke   [] History of TIA  [] Aphasia   [] Vissual changes   [] Weakness or numbness in arm   [x] Weakness or numbness in leg Musculoskeletal:   [] Joint swelling   [] Joint pain   [] Low back pain  []  History of Knee Replacement Hematologic:  [x] Easy bruising  [] Easy bleeding   [] Hypercoagulable state   [] Anemic Gastrointestinal:  [x] Diarrhea   [x] Vomiting  [] Gastroesophageal reflux/heartburn   [] Difficulty swallowing. Genitourinary:  [] Chronic kidney disease   [] Difficult urination  [] Anuric   [] Blood in urine Skin:  [] Rashes   [x] Ulcers  Psychological:  [] History of anxiety   []  History of major depression  []  Memory Difficulties     Objective:   Physical Exam  BP 125/69 (BP Location: Left Arm, Patient Position: Sitting)    Pulse 81   Temp (!) 96.1 F (35.6 C)   Resp 18   Ht 5\' 4"  (1.626 m)   Wt 155 lb (70.3 kg)   BMI 26.61 kg/m   Gen: WD/WN, NAD Head: [place]/AT, No temporalis wasting.  Ear/Nose/Throat: Hearing grossly intact, nares w/o erythema or drainage Eyes: PER, EOMI, sclera nonicteric.  Neck: Supple,  no masses.  No JVD.  Pulmonary:  Good air movement, no use of accessory muscles.  Cardiac: RRR Vascular:  Bilateral ulcerations near lateral heels.  Left foot has ulcer on heel Vessel Right Left  Radial Palpable Palpable  Dorsalis Pedis  not palpable  not palpable  Posterior Tibial  not palpable  not palpable   Gastrointestinal: soft, non-distended. No guarding/no peritoneal signs.  Musculoskeletal: Bilateral lower extremity weakness. No deformity or atrophy.  Neurologic: Pain and light touch intact in extremities.  Symmetrical.  Speech is fluent. Motor exam as listed above. Psychiatric: Judgment intact, Mood & affect appropriate for pt's clinical situation. Dermatologic: No Venous rashes.  Bilateral ulcerations near ankles.  Heel ulceration. No changes consistent with cellulitis. Lymph : No Cervical lymphadenopathy, no lichenification or skin changes of chronic lymphedema.      Assessment & Plan:   1. Chronic osteomyelitis of ankle and foot, left (HCC) Patient has recently been started on IV antibiotics due to sudden worsening of osteomyelitis of her left foot heel ulceration.  While her ABIs are not extremely low, she has an palpable pulses.  There are also concerns that her ABIs are falsely elevated due to atherosclerotic changes of the artery.  Previous ABIs show a steady decline.  We will plan for angiogram, as outlined below, to ensure that she has adequate blood flow to heal her wound.  2. Hyperlipidemia, unspecified hyperlipidemia type Continue statin as ordered and reviewed, no changes at this time   3. Atherosclerosis of native arteries of the extremities with ulceration ([place]) The  patient underwent bilateral ABIs which revealed an ABI of 1.34 on her right lower extremity and 0.80 on her left.  The TBI on the right is 0.64 with a TBI of 0.49 on the left.  Previous studies done on 07/23/2018 show a left ABI of 0.95 and a left TBI of 0.36.  The waveforms are triphasic on the right lower extremity with strong toe waveforms.  The waveforms on the left are reported as biphasic with weekend toe waveforms.  They have a more monophasic appearance however.     Recommend:  The patient has evidence of severe atherosclerotic changes of both lower extremities associated with ulceration and tissue loss of the foot.  This represents a limb threatening ischemia and places the patient at the risk for limb loss.  Patient should undergo angiography of the lower extremities with the hope for intervention for limb salvage.  The risks and benefits as well as the alternative therapies was discussed in detail with the patient.  All questions were answered.  Patient agrees to proceed with angiography.  The patient will follow up with me in the office after the procedure.     Current Outpatient Medications on File Prior to Visit  Medication Sig Dispense Refill  . aspirin EC 81 MG tablet Take 81 mg by mouth daily.    . Biotin 1000 MCG tablet Take 1,000 mcg by mouth daily.    . calcium carbonate (OSCAL) 1500 (600 Ca) MG TABS tablet Take 600 mg of elemental calcium by mouth daily.    Marland Kitchen CARTIA XT 300 MG 24 hr capsule Take 300 mg by mouth at bedtime.     Marland Kitchen ceFEPIme (MAXIPIME) 2 g injection     . Cholecalciferol (VITAMIN D) 2000 units CAPS Take 2,000 Units by mouth daily.    . dabigatran (PRADAXA) 150 MG CAPS capsule Take 150 mg by mouth 2 (two) times daily.    Marland Kitchen doxycycline (VIBRAMYCIN) 100  MG capsule Take 1 capsule by mouth 2 (two) times daily.    . furosemide (LASIX) 20 MG tablet Take 20 mg by mouth daily.     Marland Kitchen levothyroxine (SYNTHROID, LEVOTHROID) 50 MCG tablet Take 50 mcg by mouth daily before  breakfast.     . metoprolol (LOPRESSOR) 50 MG tablet Take 50 mg by mouth 2 (two) times daily.    . Multiple Vitamins-Minerals (PRESERVISION/LUTEIN PO) Take 1 capsule by mouth daily.    . NONFORMULARY OR COMPOUNDED ITEM See pharmacy note 120 each 2  . nystatin (MYCOSTATIN/NYSTOP) powder Apply topically.    Marland Kitchen omeprazole (PRILOSEC) 40 MG capsule Take 40 mg by mouth daily.     . potassium chloride SA (K-DUR,KLOR-CON) 20 MEQ tablet Take 20 mEq by mouth daily.    Marland Kitchen tolterodine (DETROL) 2 MG tablet Take 2 mg by mouth daily as needed (bladder control).     . traMADol (ULTRAM) 50 MG tablet Take 50 mg by mouth 2 (two) times daily.     Marland Kitchen acetaminophen (TYLENOL) 650 MG CR tablet Take 650 mg by mouth every 8 (eight) hours as needed for pain.     No current facility-administered medications on file prior to visit.     There are no Patient Instructions on file for this visit. No follow-ups on file.   Kris Hartmann, NP  This note was completed with Sales executive.  Any errors are purely unintentional.

## 2018-10-28 NOTE — Telephone Encounter (Signed)
I spoke with [name] and he advise for the patient to come in for abi's and see provider.

## 2018-10-28 NOTE — Telephone Encounter (Signed)
I agree, that would not be a side effect of the antibiotic. thanks

## 2018-10-29 NOTE — Telephone Encounter (Signed)
Per [place] called the patient [name] and advised symptoms may not be due to the new IV medication she is on. Had to leave a message for her to call the office in her voice mail.

## 2018-10-30 ENCOUNTER — Encounter (INDEPENDENT_AMBULATORY_CARE_PROVIDER_SITE_OTHER): Payer: Self-pay

## 2018-11-07 ENCOUNTER — Other Ambulatory Visit (INDEPENDENT_AMBULATORY_CARE_PROVIDER_SITE_OTHER): Payer: Self-pay | Admitting: Nurse Practitioner

## 2018-11-08 ENCOUNTER — Other Ambulatory Visit: Payer: Self-pay

## 2018-11-08 ENCOUNTER — Encounter: Payer: Medicare Other | Attending: Physician Assistant | Admitting: Physician Assistant

## 2018-11-08 ENCOUNTER — Encounter
Admission: RE | Admit: 2018-11-08 | Discharge: 2018-11-08 | Disposition: A | Discharge status: Home / Self Care | Payer: Medicare Other | Source: Ambulatory Visit | Attending: Vascular Surgery | Admitting: Vascular Surgery

## 2018-11-08 DIAGNOSIS — L89514 Pressure ulcer of right ankle, stage 4: Secondary | ICD-10-CM | POA: Insufficient documentation

## 2018-11-08 DIAGNOSIS — L97426 Non-pressure chronic ulcer of left heel and midfoot with bone involvement without evidence of necrosis: Secondary | ICD-10-CM | POA: Diagnosis present

## 2018-11-08 DIAGNOSIS — L97519 Non-pressure chronic ulcer of other part of right foot with unspecified severity: Secondary | ICD-10-CM | POA: Insufficient documentation

## 2018-11-08 DIAGNOSIS — I110 Hypertensive heart disease with heart failure: Secondary | ICD-10-CM | POA: Insufficient documentation

## 2018-11-08 DIAGNOSIS — Z79899 Other long term (current) drug therapy: Secondary | ICD-10-CM | POA: Diagnosis not present

## 2018-11-08 DIAGNOSIS — Z882 Allergy status to sulfonamides status: Secondary | ICD-10-CM | POA: Diagnosis not present

## 2018-11-08 DIAGNOSIS — L89623 Pressure ulcer of left heel, stage 3: Secondary | ICD-10-CM | POA: Diagnosis not present

## 2018-11-08 DIAGNOSIS — B354 Tinea corporis: Secondary | ICD-10-CM | POA: Diagnosis not present

## 2018-11-08 DIAGNOSIS — Z955 Presence of coronary angioplasty implant and graft: Secondary | ICD-10-CM | POA: Diagnosis not present

## 2018-11-08 DIAGNOSIS — I70243 Atherosclerosis of native arteries of left leg with ulceration of ankle: Secondary | ICD-10-CM | POA: Diagnosis not present

## 2018-11-08 DIAGNOSIS — I509 Heart failure, unspecified: Secondary | ICD-10-CM | POA: Diagnosis not present

## 2018-11-08 DIAGNOSIS — I70233 Atherosclerosis of native arteries of right leg with ulceration of ankle: Secondary | ICD-10-CM | POA: Insufficient documentation

## 2018-11-08 DIAGNOSIS — I70244 Atherosclerosis of native arteries of left leg with ulceration of heel and midfoot: Secondary | ICD-10-CM | POA: Diagnosis present

## 2018-11-08 DIAGNOSIS — L89892 Pressure ulcer of other site, stage 2: Secondary | ICD-10-CM | POA: Diagnosis not present

## 2018-11-08 DIAGNOSIS — Z79890 Hormone replacement therapy (postmenopausal): Secondary | ICD-10-CM | POA: Diagnosis not present

## 2018-11-08 DIAGNOSIS — G629 Polyneuropathy, unspecified: Secondary | ICD-10-CM | POA: Diagnosis not present

## 2018-11-08 DIAGNOSIS — L97421 Non-pressure chronic ulcer of left heel and midfoot limited to breakdown of skin: Secondary | ICD-10-CM | POA: Diagnosis not present

## 2018-11-08 DIAGNOSIS — Z85828 Personal history of other malignant neoplasm of skin: Secondary | ICD-10-CM | POA: Insufficient documentation

## 2018-11-08 DIAGNOSIS — M1990 Unspecified osteoarthritis, unspecified site: Secondary | ICD-10-CM | POA: Insufficient documentation

## 2018-11-08 DIAGNOSIS — Z888 Allergy status to other drugs, medicaments and biological substances status: Secondary | ICD-10-CM | POA: Diagnosis not present

## 2018-11-08 DIAGNOSIS — I890 Lymphedema, not elsewhere classified: Secondary | ICD-10-CM | POA: Insufficient documentation

## 2018-11-08 DIAGNOSIS — L89524 Pressure ulcer of left ankle, stage 4: Secondary | ICD-10-CM | POA: Insufficient documentation

## 2018-11-08 DIAGNOSIS — E785 Hyperlipidemia, unspecified: Secondary | ICD-10-CM | POA: Diagnosis not present

## 2018-11-08 DIAGNOSIS — Z950 Presence of cardiac pacemaker: Secondary | ICD-10-CM | POA: Insufficient documentation

## 2018-11-08 DIAGNOSIS — Z8249 Family history of ischemic heart disease and other diseases of the circulatory system: Secondary | ICD-10-CM | POA: Diagnosis not present

## 2018-11-08 DIAGNOSIS — Z9049 Acquired absence of other specified parts of digestive tract: Secondary | ICD-10-CM | POA: Diagnosis not present

## 2018-11-08 DIAGNOSIS — M86372 Chronic multifocal osteomyelitis, left ankle and foot: Secondary | ICD-10-CM | POA: Diagnosis not present

## 2018-11-08 DIAGNOSIS — E039 Hypothyroidism, unspecified: Secondary | ICD-10-CM | POA: Diagnosis not present

## 2018-11-08 DIAGNOSIS — Z9889 Other specified postprocedural states: Secondary | ICD-10-CM | POA: Diagnosis not present

## 2018-11-08 DIAGNOSIS — M86672 Other chronic osteomyelitis, left ankle and foot: Secondary | ICD-10-CM | POA: Diagnosis not present

## 2018-11-08 DIAGNOSIS — Z7982 Long term (current) use of aspirin: Secondary | ICD-10-CM | POA: Diagnosis not present

## 2018-11-08 DIAGNOSIS — Z8673 Personal history of transient ischemic attack (TIA), and cerebral infarction without residual deficits: Secondary | ICD-10-CM | POA: Diagnosis not present

## 2018-11-08 DIAGNOSIS — Z01812 Encounter for preprocedural laboratory examination: Secondary | ICD-10-CM

## 2018-11-08 DIAGNOSIS — Z823 Family history of stroke: Secondary | ICD-10-CM | POA: Diagnosis not present

## 2018-11-08 HISTORY — DX: Cardiac arrhythmia, unspecified: I49.9

## 2018-11-08 HISTORY — DX: Anemia, unspecified: D64.9

## 2018-11-08 HISTORY — DX: Unspecified hearing loss, unspecified ear: H91.90

## 2018-11-08 LAB — CREATININE, SERUM
CREATININE: 0.97 mg/dL (ref 0.44–1.00)
GFR calc Af Amer: 58 mL/min — ABNORMAL LOW (ref >60)
GFR, EST NON AFRICAN AMERICAN: 50 mL/min — AB (ref >60)

## 2018-11-08 LAB — BUN: BUN: 34 mg/dL — AB (ref 8–23)

## 2018-11-08 NOTE — Pre-Procedure Instructions (Signed)
Patient receiving antibiotics via PICC line twice a day. Unsure if she should take the morning dose at home. Since she is an early case and will be getting antibiotics for her procedure, the daughter stated that she will wait and will give her the afternoon dose.  Sage wipes provided to patient for home use.

## 2018-11-10 MED ORDER — DEXTROSE 5 % IV SOLN
2.0000 g | Freq: Once | INTRAVENOUS | Status: AC
  Administered 2018-11-11: 2 g via INTRAVENOUS
  Filled 2018-11-10: qty 20

## 2018-11-11 ENCOUNTER — Other Ambulatory Visit: Payer: Self-pay

## 2018-11-11 ENCOUNTER — Encounter
Admission: RE | Disposition: A | Discharge status: Home / Self Care | Payer: Self-pay | Source: Home / Self Care | Attending: Vascular Surgery

## 2018-11-11 ENCOUNTER — Ambulatory Visit
Admission: RE | Admit: 2018-11-11 | Discharge: 2018-11-11 | Disposition: A | Discharge status: Home / Self Care | Payer: Medicare Other | Attending: Vascular Surgery | Admitting: Vascular Surgery

## 2018-11-11 ENCOUNTER — Encounter: Payer: Self-pay | Admitting: *Deleted

## 2018-11-11 DIAGNOSIS — Z9889 Other specified postprocedural states: Secondary | ICD-10-CM | POA: Insufficient documentation

## 2018-11-11 DIAGNOSIS — Z823 Family history of stroke: Secondary | ICD-10-CM | POA: Insufficient documentation

## 2018-11-11 DIAGNOSIS — Z8673 Personal history of transient ischemic attack (TIA), and cerebral infarction without residual deficits: Secondary | ICD-10-CM | POA: Insufficient documentation

## 2018-11-11 DIAGNOSIS — L97909 Non-pressure chronic ulcer of unspecified part of unspecified lower leg with unspecified severity: Secondary | ICD-10-CM

## 2018-11-11 DIAGNOSIS — Z79899 Other long term (current) drug therapy: Secondary | ICD-10-CM | POA: Insufficient documentation

## 2018-11-11 DIAGNOSIS — I70299 Other atherosclerosis of native arteries of extremities, unspecified extremity: Secondary | ICD-10-CM

## 2018-11-11 DIAGNOSIS — Z882 Allergy status to sulfonamides status: Secondary | ICD-10-CM | POA: Insufficient documentation

## 2018-11-11 DIAGNOSIS — Z888 Allergy status to other drugs, medicaments and biological substances status: Secondary | ICD-10-CM | POA: Insufficient documentation

## 2018-11-11 DIAGNOSIS — L97329 Non-pressure chronic ulcer of left ankle with unspecified severity: Secondary | ICD-10-CM

## 2018-11-11 DIAGNOSIS — I70243 Atherosclerosis of native arteries of left leg with ulceration of ankle: Secondary | ICD-10-CM

## 2018-11-11 DIAGNOSIS — Z955 Presence of coronary angioplasty implant and graft: Secondary | ICD-10-CM | POA: Insufficient documentation

## 2018-11-11 DIAGNOSIS — I70244 Atherosclerosis of native arteries of left leg with ulceration of heel and midfoot: Secondary | ICD-10-CM | POA: Diagnosis not present

## 2018-11-11 DIAGNOSIS — E785 Hyperlipidemia, unspecified: Secondary | ICD-10-CM | POA: Insufficient documentation

## 2018-11-11 DIAGNOSIS — E039 Hypothyroidism, unspecified: Secondary | ICD-10-CM | POA: Insufficient documentation

## 2018-11-11 DIAGNOSIS — Z79890 Hormone replacement therapy (postmenopausal): Secondary | ICD-10-CM | POA: Insufficient documentation

## 2018-11-11 DIAGNOSIS — Z950 Presence of cardiac pacemaker: Secondary | ICD-10-CM | POA: Insufficient documentation

## 2018-11-11 DIAGNOSIS — I70245 Atherosclerosis of native arteries of left leg with ulceration of other part of foot: Secondary | ICD-10-CM

## 2018-11-11 DIAGNOSIS — M86672 Other chronic osteomyelitis, left ankle and foot: Secondary | ICD-10-CM | POA: Insufficient documentation

## 2018-11-11 DIAGNOSIS — L97529 Non-pressure chronic ulcer of other part of left foot with unspecified severity: Secondary | ICD-10-CM | POA: Diagnosis not present

## 2018-11-11 DIAGNOSIS — Z9049 Acquired absence of other specified parts of digestive tract: Secondary | ICD-10-CM | POA: Insufficient documentation

## 2018-11-11 DIAGNOSIS — Z7982 Long term (current) use of aspirin: Secondary | ICD-10-CM | POA: Insufficient documentation

## 2018-11-11 DIAGNOSIS — L97421 Non-pressure chronic ulcer of left heel and midfoot limited to breakdown of skin: Secondary | ICD-10-CM | POA: Insufficient documentation

## 2018-11-11 HISTORY — PX: LOWER EXTREMITY ANGIOGRAPHY: CATH118251

## 2018-11-11 HISTORY — DX: Gastro-esophageal reflux disease without esophagitis: K21.9

## 2018-11-11 SURGERY — LOWER EXTREMITY ANGIOGRAPHY
Anesthesia: Moderate Sedation | Site: Leg Lower | Laterality: Left

## 2018-11-11 MED ORDER — IOPAMIDOL (ISOVUE-300) INJECTION 61%
INTRAVENOUS | Status: DC | PRN
  Administered 2018-11-11: 65 mL via INTRA_ARTERIAL

## 2018-11-11 MED ORDER — FENTANYL CITRATE (PF) 100 MCG/2ML IJ SOLN
INTRAMUSCULAR | Status: AC
  Filled 2018-11-11: qty 2

## 2018-11-11 MED ORDER — ONDANSETRON HCL 4 MG/2ML IJ SOLN: 4.0000 mg | Freq: Four times a day (QID) | INTRAMUSCULAR | Status: DC | PRN

## 2018-11-11 MED ORDER — SODIUM CHLORIDE 0.9 % IV SOLN
INTRAVENOUS | Status: DC
  Administered 2018-11-11: 08:00:00 via INTRAVENOUS

## 2018-11-11 MED ORDER — HEPARIN SODIUM (PORCINE) 1000 UNIT/ML IJ SOLN
INTRAMUSCULAR | Status: AC
  Filled 2018-11-11: qty 1

## 2018-11-11 MED ORDER — HEPARIN SODIUM (PORCINE) 1000 UNIT/ML IJ SOLN
INTRAMUSCULAR | Status: DC | PRN
  Administered 2018-11-11: 4000 [IU] via INTRAVENOUS

## 2018-11-11 MED ORDER — FENTANYL CITRATE (PF) 100 MCG/2ML IJ SOLN
INTRAMUSCULAR | Status: DC | PRN
  Administered 2018-11-11 (×2): 25 ug via INTRAVENOUS

## 2018-11-11 MED ORDER — MIDAZOLAM HCL 5 MG/5ML IJ SOLN
INTRAMUSCULAR | Status: AC
  Filled 2018-11-11: qty 5

## 2018-11-11 MED ORDER — MIDAZOLAM HCL 2 MG/2ML IJ SOLN
INTRAMUSCULAR | Status: DC | PRN
  Administered 2018-11-11 (×2): 1 mg via INTRAVENOUS

## 2018-11-11 MED ORDER — HEPARIN (PORCINE) IN NACL 1000-0.9 UT/500ML-% IV SOLN
INTRAVENOUS | Status: AC
  Filled 2018-11-11: qty 1000

## 2018-11-11 MED ORDER — LIDOCAINE-EPINEPHRINE (PF) 1 %-1:200000 IJ SOLN
INTRAMUSCULAR | Status: AC
  Filled 2018-11-11: qty 10

## 2018-11-11 MED ORDER — SODIUM CHLORIDE FLUSH 0.9 % IV SOLN
INTRAVENOUS | Status: AC
  Filled 2018-11-11: qty 50

## 2018-11-11 MED ORDER — HYDROMORPHONE HCL 1 MG/ML IJ SOLN: 1.0000 mg | Freq: Once | INTRAMUSCULAR | Status: DC | PRN

## 2018-11-11 SURGICAL SUPPLY — 17 items
BALLN LUTONIX 018 4X150X130 (BALLOONS) ×3
BALLN LUTONIX 5X220X130 (BALLOONS) ×3
BALLN ULTRVRSE 2.5X220X150 (BALLOONS) ×3
BALLOON LUTONIX 018 4X150X130 (BALLOONS) ×1 IMPLANT
BALLOON LUTONIX 5X220X130 (BALLOONS) ×1 IMPLANT
BALLOON ULTRVRSE 2.5X220X150 (BALLOONS) ×1 IMPLANT
CATH BEACON 5 .038 100 VERT TP (CATHETERS) ×3 IMPLANT
CATH PIG 70CM (CATHETERS) ×3 IMPLANT
DEVICE PRESTO INFLATION (MISCELLANEOUS) ×3 IMPLANT
DEVICE STARCLOSE SE CLOSURE (Vascular Products) ×3 IMPLANT
GLIDEWIRE ADV .035X260CM (WIRE) ×3 IMPLANT
PACK ANGIOGRAPHY (CUSTOM PROCEDURE TRAY) ×3 IMPLANT
SHEATH ANL2 6FRX45 HC (SHEATH) ×3 IMPLANT
SHEATH BRITE TIP 5FRX11 (SHEATH) ×3 IMPLANT
TUBING CONTRAST HIGH PRESS 72 (TUBING) ×3 IMPLANT
WIRE G V18X300CM (WIRE) ×3 IMPLANT
WIRE J 3MM .035X145CM (WIRE) ×3 IMPLANT

## 2018-11-11 NOTE — Progress Notes (Signed)
HEVIN, JEFFCOAT ([id number]) Visit Report for 11/08/2018 Chief Complaint Document Details Patient Name: [name], [name]. Date of Service: 11/08/2018 9:00 AM Medical Record Number: [id number] Patient Account Number: [id number] Date of Birth/Sex: [birth year] (84 y.o. F) Treating RN: Montey Hora Primary Care Provider: Grayland Ormond Other Clinician: Referring Provider: Grayland Ormond Treating Provider/Extender: Melburn Hake, HOYT Weeks in Treatment: 50 Information Obtained from: Patient Chief Complaint Patient is here for right lateral malleolus and left lateral malleolus ulcer Electronic Signature(s) Signed: 11/11/2018 12:50:09 AM By: Worthy Keeler PA-C Entered By: Worthy Keeler on 11/08/2018 09:29:14 Artz, [name] ([id number]) -------------------------------------------------------------------------------- Debridement Details Patient Name: [name] Date of Service: 11/08/2018 9:00 AM Medical Record Number: [id number] Patient Account Number: [id number] Date of Birth/Sex: [birth year] (84 y.o. F) Treating RN: Montey Hora Primary Care Provider: Grayland Ormond Other Clinician: Referring Provider: Grayland Ormond Treating Provider/Extender: Melburn Hake, HOYT Weeks in Treatment: 65 Debridement Performed for Wound #3 Left Calcaneus Assessment: Performed By: Physician STONE III, HOYT E., PA-C Debridement Type: Debridement Level of Consciousness (Pre- Awake and Alert procedure): Pre-procedure Verification/Time Yes - 09:57 Out Taken: Start Time: 09:57 Pain Control: Lidocaine 4% Topical Solution Total Area Debrided (L x W): 2 (cm) x 2.5 (cm) = 5 (cm) Tissue and other material Non-Viable, Skin: Dermis , Skin: Epidermis debrided: Level: Skin/Epidermis Debridement Description: Selective/Open Wound Instrument: Forceps, Scissors Bleeding: None End Time: 09:59 Procedural Pain: 0 Post Procedural Pain: 0 Response to Treatment: Procedure was tolerated well Level  of Consciousness Awake and Alert (Post-procedure): Post Debridement Measurements of Total Wound Length: (cm) 2 Stage: Category/Stage II Width: (cm) 2.5 Depth: (cm) 0.1 Volume: (cm) 0.393 Character of Wound/Ulcer Post Improved Debridement: Post Procedure Diagnosis Same as Pre-procedure Electronic Signature(s) Signed: 11/08/2018 3:45:44 PM By: Montey Hora Signed: 11/11/2018 12:50:09 AM By: Worthy Keeler PA-C Entered By: Montey Hora on 11/08/2018 09:59:06 Bjorn, [name] ([id number]) -------------------------------------------------------------------------------- Debridement Details Patient Name: [name] Date of Service: 11/08/2018 9:00 AM Medical Record Number: [id number] Patient Account Number: [id number] Date of Birth/Sex: 1923-09-08 (84 y.o. F) Treating RN: Montey Hora Primary Care Provider: Grayland Ormond Other Clinician: Referring Provider: Grayland Ormond Treating Provider/Extender: Melburn Hake, HOYT Weeks in Treatment: 65 Debridement Performed for Wound #1 Right,Lateral Malleolus Assessment: Performed By: Physician STONE III, HOYT E., PA-C Debridement Type: Debridement Severity of Tissue Pre Fat layer exposed Debridement: Level of Consciousness (Pre- Awake and Alert procedure): Pre-procedure Verification/Time Yes - 10:05 Out Taken: Start Time: 10:05 Pain Control: Lidocaine 4% Topical Solution Total Area Debrided (L x W): 0.8 (cm) x 0.5 (cm) = 0.4 (cm) Tissue and other material Viable, Non-Viable, Eschar, Slough, Subcutaneous, Fibrin/Exudate, Slough debrided: Level: Skin/Subcutaneous Tissue Debridement Description: Excisional Instrument: Curette, Rongeur Bleeding: None End Time: 10:09 Procedural Pain: 0 Post Procedural Pain: 0 Response to Treatment: Procedure was tolerated well Level of Consciousness Awake and Alert (Post-procedure): Post Debridement Measurements of Total Wound Length: (cm) 0.5 Stage: Category/Stage IV Width:  (cm) 0.5 Depth: (cm) 0.2 Volume: (cm) 0.039 Character of Wound/Ulcer Post Improved Debridement: Severity of Tissue Post Debridement: Fat layer exposed Post Procedure Diagnosis Same as Pre-procedure Electronic Signature(s) Signed: 11/08/2018 3:45:44 PM By: Montey Hora Signed: 11/11/2018 12:50:09 AM By: Worthy Keeler PA-C Entered By: Montey Hora on 11/08/2018 10:09:24 [name] ([id number]) -------------------------------------------------------------------------------- HPI Details Patient Name: [name] Date of Service: 11/08/2018 9:00 AM Medical Record Number: [id number] Patient Account Number: [id number] Date of Birth/Sex: [birth year] (84 y.o. 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 Dr. Amalia Hailey the podiatrist saw her on 07/20/2017 and sent her to the wound center for possible hyperbaric oxygen therapy. past medical history of peripheral vascular disease, varicose veins, status post appendectomy, basal cell carcinoma excision from the left leg, cholecystectomy, pacemaker placement, right lower extremity angiography done by Dr. dew in March 2017 with placement of a stent. there is also note of a successful ablation of the right small saphenous vein done which was reviewed by ultrasound on 10/24/2016. the patient had a right small saphenous vein ablation done on 10/20/2016. The patient has never been a smoker. She has been seen by Dr. Corene Cornea dew the vascular surgeon who most recently saw her on 06/15/2017 for evaluation of ongoing problems with right leg swelling. She had a  lower extremity arterial duplex examination done(02/13/17) which showed patent distal right superficial femoral artery stent and above-the-knee popliteal stent without evidence of restenosis. The ABI was more than 1.3 on the right and more than 1.3 on the left. This was consistent with noncompressible arteries due to medial calcification. The right great toe pressure and PPG waveforms are within normal limits and the left great toe pressure and PPG waveforms are decreased. he recommended she continue to wear her compression stockings and continue with elevation. She is scheduled to have a noninvasive arterial study in the near future 08/16/2017 -- had a lower extremity arterial duplex examination done which showed patent distal right superficial femoral artery stent and above-the-knee popliteal stent without evidence of restenosis. The ABI was more than 1.3 on the right and more than 1.3 on the left. This was consistent with noncompressible arteries due to medial calcification. The right great toe pressure and PPG waveforms are within normal limits and the left great toe pressure and PPG waveforms are decreased. the x-ray of the right ankle has not yet been done 08/24/2017 -- had a right ankle x-ray -- IMPRESSION:1. No fracture, bone lesion or evidence of osteomyelitis. 2. Lateral soft tissue swelling with a soft tissue ulcer. she has not yet seen the vascular surgeon for review 08/31/17 on evaluation today patient's wound appears to be showing signs of improvement. She still with her appointment with vascular in order to review her results of her vascular study and then determine if any intervention would be recommended at that time. No fevers, chills, nausea, or vomiting noted at this time. She has been tolerating the dressing changes without complication. 09/28/17 on evaluation today patient's wound appears to show signs of good improvement in regard to the granulation tissue which is surfacing.  There is still a layer of slough covering the wound and the posterior portion is still significantly deeper than the anterior nonetheless there has been some good sign of things moving towards the better. She is going to go back to Dr. dew for reevaluation to ensure her blood flow is still appropriate. That will be before her next evaluation with [place] next week. No fevers, chills, nausea, or vomiting noted at this time. Patient does have some discomfort rated to be a 3-4/10 depending on activity specifically cleansing the wound makes it worse. 10/05/2017 -- the patient was seen by Dr. Lucky Cowboy last week and noninvasive studies showed a normal right ABI with brisk triphasic  waveforms consistent with no arterial insufficiency including normal digital pressures. The duplex showed a patent distal right SFA stent and the proximal SFA was also normal. He was pleased with her test and thought she should have enough of perfusion for normal wound healing. He would see her back in 6 months time. 12/21/17 on evaluation today patient appears to be doing fairly well in regard to her right lateral ankle wound. Unfortunately the main issue that she is expansion at this point is that she is having some issues with what appears to be some cellulitis in the ANGLA, DELAHUNT. ([id number]) right anterior shin. She has also been noting a little bit of uncomfortable feeling especially last night and her ankle area. I'm afraid that she made the developing a little bit of an infection. With that being said I think it is in the early stages. 12/28/17 on evaluation today patient's ankle appears to be doing excellent. She's making good progress at this point the cellulitis seems to have improved after last week's evaluation. Overall she is having no significant discomfort which is excellent news. She does have an appointment with Dr. dew on March 29, 2018 for reevaluation in regard to the stent he placed. She seems to have excellent  blood flow in the right lower extremity. 01/19/12 on evaluation today patient's wound appears to be doing very well. In fact she does not appear to require debridement at this point, there's no evidence of infection, and overall from the standpoint of the wound she seems to be doing very well. With that being said I believe that it may be time to switch to different dressing away from the [facility] Dressing she tells me she does have a lot going on her friend actually passed away yesterday and she's also having a lot of issues with her husband this obviously is weighing heavy on her as far as your thoughts and concerns today. 01/25/18 on evaluation today patient appears to be doing fairly well in regard to her right lateral malleolus. She has been tolerating the dressing changes without complication. Overall I feel like this is definitely showing signs of improvement as far as how the overall appearance of the wound is there's also evidence of epithelium start to migrate over the granulation tissue. In general I think that she is progressing nicely as far as the wound is concerned. The only concern she really has is whether or not we can switch to every other week visits in order to avoid having as many appointments as her daughters have a difficult time getting her to her appointments as well as the patient's husband to his he is not doing very well at this point. 02/22/18 on evaluation today patient's right lateral malleolus ulcer appears to be doing great. She has been tolerating the dressing changes without complication. Overall you making excellent progress at this time. Patient is having no significant discomfort. 03/15/18 on evaluation today patient appears to be doing much more poorly in regard to her right lateral ankle ulcer at this point. Unfortunately since have last seen her her husband has passed just a few days ago is obviously weighed heavily on her her daughter also had surgery  well she is with her today as usual. There does not appear to be any evidence of infection she does seem to have significant contusion/deep tissue injury to the right lateral malleolus which was not noted previous when I saw her last. It's hard to tell of exactly when this injury  occurred although during the time she was spending the night in the hospital this may have been most likely. 03/22/18 on evaluation today patient appears to actually be doing very well in regard to her ulcer. She did unfortunately have a setback which was noted last week however the good news is we seem to be getting back on track and in fact the wound in the core did still have some necrotic tissue which will be addressed at this point today but in general I'm seeing signs that things are on the up and up. She is glad to hear this obviously she's been somewhat concerned that due to the how her wound digressed more recently. 03/29/18 on evaluation today patient appears to be doing fairly well in regard to her right lower extremity lateral malleolus ulcer. She unfortunately does have a new area of pressure injury over the inferior portion where the wound has opened up a little bit larger secondary to the pressure she seems to be getting. She does tell me sometimes when she sleeps at night that it actually hurts and does seem to be pushing on the area little bit more unfortunately. There does not appear to be any evidence of infection which is good news. She has been tolerating the dressing changes without complication. She also did have some bruising in the left second and third toes due to the fact that she may have bump this or injured it although she has neuropathy so she does not feel she did move recently that may have been where this came from. Nonetheless there does not appear to be any evidence of infection at this time. 04/12/18 on evaluation today patient's wound on the right lateral ankle actually appears to be doing  a little bit better with a lot of necrotic docking tissue centrally loosening up in clearing away. However she does have the beginnings of a deep tissue injury on the left lateral malleolus likely due to the fact we've been trying offload the right as much as we have. I think she may benefit from an assistive soft device to help with offloading and it looks like they're looking at one of the doughnut conditions that wraps around the lower leg to offload which I think will definitely do a good job. With that being said I think we definitely need to address this issue on the left before it becomes a wound. Patient is not having significant pain. 04/19/18 on evaluation today patient appears to be doing excellent in regard to the progress she's made with her right lateral ankle ulcer. The left ankle region which did show evidence of a deep tissue injury seems to be resolving there's little fluid noted underneath and a blister there's nothing open at this point in time overall I feel like this is progressing nicely which is good news. She does not seem to be having significant discomfort at this point which is also good news. 04/25/18-She is here in follow up evaluation for bilateral lateral malleolar ulcers. The right lateral malleolus ulcer with pale subcutaneous tissue exposure, central area of ulcer with tendon/periosteum exposed. The left lateral malleolus ulcer now with Hulick, [name] ([id number]) central area of nonviable tissue, otherwise deep tissue injury. She is wearing compression wraps to the left lower extremity, she will place the right lower extremity compression wraps on when she gets home. She will be out of town over the weekend and return next week and follow-up appointment. She completed her doxycycline this morning 05/03/18 on  evaluation today patient appears to be doing very well in regard to her right lateral ankle ulcer in general. At least she's showing some signs of  improvement in this regard. Unfortunately she has some additional injury to the left lateral malleolus region which appears to be new likely even over the past several days. Again this determination is based on the overall appearance. With that being said the patient is obviously frustrated about this currently. 05/10/18-She is here in follow-up evaluation for bilateral lateral malleolar ulcers. She states she has purchased offloading shoes/boots and they will arrive tomorrow. She was asked to bring them in the office at next week's appointment so her provider is aware of product being utilized. She continues to sleep on right or left side, she has been encouraged to sleep on her back. The right lateral malleolus ulcer is precariously close to peri-osteum; will order xray. The left lateral malleolus ulcer is improved. Will switch back to santyl; she will follow up next week. 05/17/18 on evaluation today patient actually appears to be doing very well in regard to her malleolus her ulcers compared to last time I saw them. She does not seem to have as much in the way of contusion at this point which is great news. With that being said she does continue to have discomfort and I do believe that she is still continuing to benefit from the offloading/pressure reducing boots that were recommended. I think this is the key to trying to get this to heal up completely. 05/24/18 on evaluation today patient actually appears to be doing worse at this point in time unfortunately compared to her last week's evaluation. She is having really no increased pain which is good news unfortunately she does have more maceration in your theme and noted surrounding the right lateral ankle the left lateral ankle is not really is erythematous I do not see signs of the overt cellulitis on that side. Unfortunately the wounds do not seem to have shown any signs of improvement since the last evaluation. She also has significant swelling  especially on the right compared to previous some of this may be due to infection however also think that she may be served better while she has these wounds by compression wrapping versus continuing to use the Juxta-Lite for the time being. Especially with the amount of drainage that she is experiencing at this point. No fevers, chills, nausea, or vomiting noted at this time. 05/31/18 on evaluation today patient appears to actually be doing better in regard to her right lateral lower extremity ulcer specifically on the malleolus region. She has been tolerating the antibiotic without complication. With that being said she still continues to have issues but a little bit of redness although nothing like she what she was experiencing previous. She still continues to pressure to her ankle area she did get the problem on offloading boots unfortunately she will not wear them she states there too uncomfortable and she can't get in and out of the bed. Nonetheless at this point her wounds seem to be continually getting worse which is not what we want I'm getting somewhat concerned about her progress and how things are going to proceed if we do not intervene in some way shape or form. I therefore had a very lengthy conversation today about offloading yet again and even made a specific suggestion for switching her to a memory foam mattress and even gave the information for a specific one that they could look at getting if  it was something that they were interested in considering. She does not want to be considered for a hospital bed air mattress although honestly insurance would not cover it that she does not have any wounds on her trunk. 06/14/18 on evaluation today both wounds over the bilateral lateral malleolus her ulcers appear to be doing better there's no evidence of pressure injury at this point. She did get the foam mattress for her bed and this does seem to have been extremely beneficial for her in my  pinion. Her daughter states that she is having difficulty getting out of bed because of how soft it is. The patient also relates this to be. Nonetheless I do feel like she's actually doing better. Unfortunately right after and around the time she was getting the mattress she also sustained a fall when she got up to go pick up the phone and ended up injuring her right elbow she has 18 sutures in place. We are not caring for this currently although home health is going to be taking the sutures out shortly. Nonetheless this may be something that we need to evaluate going forward. It depends on how well it has or has not healed in the end. She also recently saw an orthopedic specialist for an injection in the right shoulder just before her fall unfortunately the fall seems to have worsened her pain. 06/21/18 on evaluation today patient appears to be doing about the same in regard to her lateral malleolus ulcers. Both appear to be just a little bit deeper but again we are clinging away the necrotic and dead tissue which I think is why this is progressing towards a deeper realm as opposed to improving from my measurement standpoint in that regard. Nonetheless she has been tolerating the dressing changes she absolutely hates the memory foam mattress topper that was obtained for her nonetheless I do believe this is still doing excellent as far as taking care of excess pressure in regard to the lateral malleolus regions. She in fact has no pressure injury that I see whereas in weeks past it was week by week I was constantly seeing new pressure injuries. Overall I think it has been very beneficial for her. 07/03/18; patient arrives in my clinic today. She has deep punched out areas over her bilateral lateral malleoli. The area on the right has some more depth. AIZAH, GEHLHAUSEN ([id number]) We spent a lot of time today talking about pressure relief for these areas. This started when her daughter asked for  a prescription for a memory foam mattress. I have never written a prescription for a mattress and I don't think insurances would pay for that on an ordinary bed. In any case he came up that she has foam boots that she refuses to wear. I would suggest going to these before any other offloading issues when she is in bed. They say she is meticulous about offloading this the rest of the day 07/10/18- She is seen in follow-up evaluation for bilateral, lateral malleolus ulcers. There is no improvement in the ulcers. She has purchased and is sleeping on a memory foam mattress/overlay, she has been using the offloading boots nightly over the past week. She has a follow up appointment with vascular medicine at the end of October, in my opinion this follow up should be expedited given her deterioration and suboptimal TBI results. We will order plain film xray of the left ankle as deeper structures are palpable; would consider having MRI, regardless of xray  report(s). The ulcers will be treated with iodoflex/iodosorb, she is unable to safely change the dressings daily with santyl. 07/19/18 on evaluation today patient appears to be doing in general visually well in regard to her bilateral lateral malleolus ulcers. She has been tolerating the dressing changes without complication which is good news. With that being said we did have an x-ray performed on 07/12/18 which revealed a slight loosen see in the lateral portion of the distal left fibula which may represent artifact but underline lytic destruction or osteomyelitis could not be excluded. MRI was recommended. With that being said we can see about getting the patient scheduled for an MRI to further evaluate this area. In fact we have that scheduled currently for August 20 19,019. 07/26/18 on evaluation today patient's wound on the right lateral ankle actually appears to be doing fairly well at this point in my pinion. She has made some good progress currently.  With that being said unfortunately in regard to the left lateral ankle ulcer this seems to be a little bit more problematic at this time. In fact as I further evaluated the situation she actually had bone exposed which is the first time that's been the case in the bone appear to be necrotic. Currently I did review patient's note from Dr. Bunnie Domino office with [facility]. He stated that ABI was 1.26 on the right and 0.95 on the left with good waveforms. Her perfusion is stable not reduced from previous studies and her digital waveforms were pretty good particularly on the right. His conclusion upon review of the note was that there was not much she could do to improve her perfusion and he felt she was adequate for wound healing. His suggestion was that she continued to see [place] and consider a synthetic skin graft if there was no underlying infection. He plans to see her back in six months or as needed. 08/01/18 on evaluation today patient appears to be doing better in regard to her right lateral ankle ulcer. Her left lateral ankle ulcer is about the same she still has bone involvement in evidence of necrosis. There does not appear to be evidence of infection at this time On the right lateral lower extremity. I have started her on the Augmentin she picked this up and started this yesterday. This is to get her through until she sees infectious disease which is scheduled for 08/12/18. 08/06/18 on evaluation today patient appears to be doing rather well considering my discussion with patient's daughter at the end of last week. The area which was marked where she had erythema seems to be improved and this is good news. With that being said overall the patient seems to be making good improvement when it comes to the overall appearance of the right lateral ankle ulcer although this has been slow she at least is coming around in this regard. Unfortunately in regard to the left lateral ankle  ulcer this is osteomyelitis based on the pathology report as well is bone culture. Nonetheless we are still waiting CT scan. Unfortunately the MRI we originally ordered cannot be performed as the patient is a pacemaker which I had overlooked. Nonetheless we are working on the CT scan approval and scheduling as of now. She did go to the hospital over the weekend and was placed on IV Cefzo for a couple of days. Fortunately this seems to have improved the erythema quite significantly which is good news. There does not appear to be any evidence  of worsening infection at this time. She did have some bleeding after the last debridement therefore I did not perform any sharp debridement in regard to left lateral ankle at this point. Patient has been approved for a snap vac for the right lateral ankle. 08/14/18; the patient with wounds over her bilateral lateral malleoli. The area on the right actually looks quite good. Been using a snap back on this area. Healthy granulation and appears to be filling in. Unfortunately the area on the left is really problematic. 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 Also noted to have cellulitis. She saw Dr. Novella Olive of infectious disease today and was put on linezolid. We are able to verify this with her pharmacy. She is completed the Augmentin that she was already on. We've been using Iodoflex to this area 08/23/18 on evaluation today patient's wounds both actually appear to be doing better compared to my prior evaluations. Fortunately she showing signs of good improvement in regard to the overall wound status especially where were using the snap vac on the right. In regard to left lateral malleolus the wound bed actually appears to be much cleaner than previously noted. I do not feel any phone directly probed during evaluation today and though there is tendon noted this does not  appear Dave, [name] ([id number]) to be necrotic it's actually fairly good as far as the overall appearance of the tendon is concerned. In general the wound bed actually appears to be doing significantly better than it was previous. Patient is currently in the care of [name] and I did review that note today. He actually has her on two weeks of linezolid and then following the patient will be on 1-2 months of Keflex. That is the plan currently. She has been on antibiotics therapy as prescribed by myself initially starting on July 30, 2018 and has been on that continuously up to this point. 08/30/18 on evaluation today patient actually appears to be doing much better in regard to her right lateral malleolus ulcer. She has been tolerating the dressing changes specifically the snap vac without complication although she did have some issues with the seal currently. Apparently there was some trouble with getting it to maintain over the past week past Sunday. Nonetheless overall the wound appears better in regard to the right lateral malleolus region. In regard to left lateral malleolus this actually show some signs of additional granulation although there still tendon noted in the base of the wound this appears to be healthy not necrotic in any way whatsoever. We are considering potentially using a snap vac for the left lateral malleolus as well the product wrap from [employer], [place], was present in the clinic today we're going to see this patient I did have her come in with me after obtaining consent from the patient and her daughter in order to look at the wound and see if there's any recommendation one way or another as to whether or not they felt the snapback could be beneficial for the left lateral malleolus region. But the conclusion was that it might be but that this is definitely a little bit deeper wound than what traditionally would be utilized for a snap vac. 09/06/18 on evaluation today  patient actually appears to be doing excellent in my pinion in regard to both ankle ulcers. She has been tolerating the dressing changes without complication which is great news. Specifically we  have been using the snap vac. In regard to the right ankle I'm not even sure that this is going to be necessary for today and following as the wound has filled in quite nicely. In regard to the left ankle I do believe that we're seeing excellent epithelialization from the edge as well as granulation in the central portion the tendon is still exposed but there's no evidence of necrotic bone and in general I feel like the patient has made excellent progress even compared to last week with just one week of the snap vac. 09/11/18; this is a patient who has wounds on her bilateral lateral malleoli. Initially both of these were deep stage IV wounds in the setting of chronic arterial insufficiency. She has been revascularized. As I understand think she been using snap vacs to both of these wounds however the area on the right became more superficial and currently she is only using it on the left. Using silver collagen on the right and silver collagen under the back on the left I believe 09/19/18 on evaluation today patient actually appears to be doing very well in regard to her lateral malleolus or ulcers bilaterally. She has been tolerating the dressing changes without complication. Fortunately there does not appear to be any evidence of infection at this time. Overall I feel like she is improving in an excellent manner and I'm very pleased with the fact that everything seems to be turning towards the better for her. This has obviously been a long road. 09/27/18 on evaluation today patient actually appears to be doing very well in regard to her bilateral lateral malleolus ulcers. She has been tolerating the dressing changes without complication. Fortunately there does not appear to be any evidence of infection at  this time which is also great news. No fevers, chills, nausea, or vomiting noted at this time. Overall I feel like she is doing excellent with the snap vac on the left malleolus. She had 40 mL of fluid collection over the past week. 10/04/18 on evaluation today patient actually appears to be doing well in regard to her bilateral lateral malleolus ulcers. She continues to tolerate the dressing changes without complication. One issue that I see is the snap vac on the left lateral malleolus which appears to have sealed off some fluid underlying this area and has not really allowed it to heal to the degree that I would like to see. For that reason I did suggest at this point we may want to pack a small piece of packing strip into this region to allow it to more effectively wick out fluid. 10/11/18 in general the patient today does not feel that she has been doing very well. She's been a little bit lethargic and subsequently is having bodyaches as well according to what she tells me today. With that being said overall she has been concerned with the fact that something may be worsening although to be honest her wounds really have not been appearing poorly. She does have a new ulcer on her left heel unfortunately. This may be pressure related. Nonetheless it seems to me to have potentially started at least as a blister I do not see any evidence of deep tissue injury. In regard to the left ankle the snap vac still seems to be causing the ceiling off of the deeper part of the wound which is in turn trapping fluid. I'm not extremely pleased with the overall appearance as far as progress from last week to this  week therefore I'm gonna discontinue the snap vac at this point. 10/18/18 patient unfortunately this point has not been feeling well for the past several days. She was seen by Grayland Ormond her primary care provider who is a Librarian, academic at [facility]. Subsequently she states that she's been  very weak and generally feeling malaise. No fevers, chills, nausea, or vomiting noted at this time. With that being said bloodwork was performed at the PCP office on the 11th of this month which showed a white blood cell count of 10.7. This was repeated today Mintzer, [name] ([id number]) and shows a white blood cell count of 12.4. This does show signs of worsening. Coupled with the fact that she is feeling worse and that her left ankle wound is not really showing signs of improvement I feel like this is an indication that the osteomyelitis is likely exacerbating not improving. Overall I think we may also want to check her C-reactive protein and sedimentation rate. Actually did call [facility] office this afternoon while the patient was in the office here with me. Subsequently based on the findings we discussed treatment possibilities and I think that it is appropriate for [place] to go ahead and initiate treatment with doxycycline which I'm going to do. Subsequently he did agree to see about adding a CRP and sedimentation rate to her orders. If that has not already been drawn to where they can run it they will contact the patient she can come back to have that check. They are in agreement with plan as far as the patient and her daughter are concerned. Nonetheless also think we need to get in touch with Dr. Henreitta Leber office to see about getting the patient scheduled with him as soon as possible. 11/08/18 on evaluation today patient presents for follow-up concerning her bilateral foot and ankle ulcers. I did do an extensive review of her chart in epic today. Subsequently she was seen by [name] he did initiate Cefepime IV antibiotic therapy. Subsequently she had some issues with her PICC line this had to be removed because it was coiled and then replaced. Fortunately that was now settled. Unfortunately she has continued have issues with her left heel as well as the issues that she is experiencing  with her bilateral lateral malleolus regions. I do believe however both areas seem to be doing a little bit better on evaluation today which is good news. No fevers, chills, nausea, or vomiting noted at this time. She actually has an angiogram schedule with Dr. dew on this coming Monday, November 11, 2018. Subsequently the patient states that she is feeling much better especially than what she was roughly 2 weeks ago. She actually had to cancel an appointment because she was feeling so poorly. No fevers, chills, nausea, or vomiting noted at this time. Electronic Signature(s) Signed: 11/11/2018 12:50:09 AM By: Worthy Keeler PA-C Entered By: Worthy Keeler on 11/08/2018 10:17:16 Heckmann, [name] ([id number]) -------------------------------------------------------------------------------- Physical Exam Details Patient Name: [name] Date of Service: 11/08/2018 9:00 AM Medical Record Number: [id number] Patient Account Number: [id number] Date of Birth/Sex: [birth year] (84 y.o. F) Treating RN: Montey Hora Primary Care Provider: Grayland Ormond Other Clinician: Referring Provider: Grayland Ormond Treating Provider/Extender: STONE III, HOYT Weeks in Treatment: 88 Constitutional Well-nourished and well-hydrated in no acute distress. Respiratory normal breathing without difficulty. clear to auscultation bilaterally. Cardiovascular regular rate and rhythm with normal S1, S2. Psychiatric this patient is able to make decisions and demonstrates good insight into disease  process. Alert and Oriented x 3. pleasant and cooperative. Notes At this point the patient's wound beds actually appear to be doing fairly well. The right lateral malleolus is still open although there was some eschar covering I did have to remove this along with [facility] and subcutaneous tissue to get down to a good level. She tolerated this without complication this again with the right lower extremity. With that being  said I did mechanically debride the left lateral malleolus region. And I did perform a selective debridement of the left heel to remove some of the dead tissue surrounding no bleeding occurred during this removal. Obviously I am somewhat concerned about the possibility of issues with arterial flow which is why no significant and more aggressive debridement was undertaken today. She does have bilateral lower [place] edema although with the questionable arterial findings by [facility] I'm still somewhat reluctant to have her even with her compression stocking. Electronic Signature(s) Signed: 11/11/2018 12:50:09 AM By: Worthy Keeler PA-C Entered By: Worthy Keeler on 11/08/2018 10:18:33 Friddle, [name] ([id number]) -------------------------------------------------------------------------------- Physician Orders Details Patient Name: [name] Date of Service: 11/08/2018 9:00 AM Medical Record Number: [id number] Patient Account Number: [id number] Date of Birth/Sex: 12-Jun-1923 (84 y.o. F) Treating RN: Montey Hora Primary Care Provider: Grayland Ormond Other Clinician: Referring Provider: Grayland Ormond Treating Provider/Extender: Melburn Hake, HOYT Weeks in Treatment: 44 Verbal / Phone Orders: No Diagnosis Coding ICD-10 Coding Code Description 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, stage 4 L89.524 Pressure ulcer of left ankle, stage 4 S90.822A Blister (nonthermal), left foot, initial encounter W40.973 Pressure ulcer of left heel, stage 2 I70.233 Atherosclerosis of native arteries of right leg with ulceration of ankle I70.243 Atherosclerosis of native arteries of left leg with ulceration of ankle I89.0 Lymphedema, not elsewhere classified B35.4 Tinea corporis 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 o Cleanse wound with mild soap and water o May  Shower, gently pat wound dry prior to applying new dressing. Wound #2 Left,Lateral Malleolus o Clean wound with Normal Saline. o Cleanse wound with mild soap and water o May Shower, gently pat wound dry prior to applying new dressing. Wound #3 Left Calcaneus o Clean wound with Normal Saline. o Cleanse wound with mild soap and water o May Shower, gently pat wound dry prior to applying new dressing. Anesthetic (add to Medication List) Wound #1 Right,Lateral Malleolus o Topical Lidocaine 4% cream applied to wound bed prior to debridement (In Clinic Only). Wound #2 Left,Lateral Malleolus o Topical Lidocaine 4% cream applied to wound bed prior to debridement (In Clinic Only). Wound #3 Left Calcaneus o Topical Lidocaine 4% cream applied to wound bed prior to debridement (In Clinic Only). Primary Wound Dressing Wound #1 Right,Lateral Malleolus o Silver Collagen Valerie, Cones [name] ([id number]) Wound #2 Left,Lateral Malleolus o Silver Alginate Wound #3 Left Calcaneus o Silver Alginate Secondary Dressing Wound #1 Right,Lateral Malleolus o Boardered Foam Dressing Wound #2 Left,Lateral Malleolus o ABD and Kerlix/Conform - secure with netting Wound #3 Left Calcaneus o ABD and Kerlix/Conform - secure with netting Dressing Change Frequency Wound #1 Right,Lateral Malleolus o Change Dressing Monday, Wednesday, Friday Wound #2 Left,Lateral Malleolus o Change Dressing Monday, Wednesday, Friday Wound #3 Left Calcaneus o Change Dressing Monday, Wednesday, Friday Follow-up Appointments Wound #1 Right,Lateral Malleolus o Return Appointment in 1 week. Wound #2 Left,Lateral Malleolus o Return Appointment in 1 week. Wound #3 Left Calcaneus o Return Appointment  in 1 week. Edema Control Wound #1 Right,Lateral Malleolus o Patient to wear own compression stockings o Patient to wear own Velcro compression garment. o Elevate legs to the level of the  heart and pump ankles as often as possible Wound #2 Left,Lateral Malleolus o Patient to wear own compression stockings o Patient to wear own Velcro compression garment. o Elevate legs to the level of the heart and pump ankles as often as possible Off-Loading Wound #1 Right,Lateral Malleolus o Turn and reposition every 2 hours o Other: - do not put pressure on your ankles Wound #2 Left,Lateral Malleolus o Turn and reposition every 2 hours Behanna, [name] ([id number]) o Other: - do not put pressure on your ankles Additional Orders / Instructions Wound #1 Right,Lateral Malleolus o Increase protein intake. Wound #2 Left,Lateral Malleolus o Increase protein intake. Home Health Wound #1 [place] Visits - [place] Nurse may visit PRN to address patientos wound care needs. o FACE TO FACE ENCOUNTER: MEDICARE and MEDICAID PATIENTS: I certify that this patient is under my care and that I had a face-to-face encounter that meets the physician face-to-face encounter requirements with this patient on this date. The encounter with the patient was in whole or in part for the following MEDICAL CONDITION: (primary reason for [place]) MEDICAL NECESSITY: I certify, that based on my findings, NURSING services are a medically necessary home health service. HOME BOUND STATUS: I certify that my clinical findings support that this patient is homebound (i.e., Due to illness or injury, pt requires aid of supportive devices such as crutches, cane, wheelchairs, walkers, the use of special transportation or the assistance of another person to leave their place of residence. There is a normal inability to leave the home and doing so requires considerable and taxing effort. Other absences are for medical reasons / religious services and are infrequent or of short duration when for other reasons). o If current dressing causes  regression in wound condition, may D/C ordered dressing product/s and apply Normal Saline Moist Dressing daily until next [place] / Other MD appointment. [place] of regression in wound condition at [phone]. o Please direct any NON-WOUND related issues/requests for orders to patient's Primary Care Physician Wound #2 [place] Visits - [place] Nurse may visit PRN to address patientos wound care needs. o FACE TO FACE ENCOUNTER: MEDICARE and MEDICAID PATIENTS: I certify that this patient is under my care and that I had a face-to-face encounter that meets the physician face-to-face encounter requirements with this patient on this date. The encounter with the patient was in whole or in part for the following MEDICAL CONDITION: (primary reason for [place]) MEDICAL NECESSITY: I certify, that based on my findings, NURSING services are a medically necessary home health service. HOME BOUND STATUS: I certify that my clinical findings support that this patient is homebound (i.e., Due to illness or injury, pt requires aid of supportive devices such as crutches, cane, wheelchairs, walkers, the use of special transportation or the assistance of another person to leave their place of residence. There is a normal inability to leave the home and doing so requires considerable and taxing effort. Other absences are for medical reasons / religious services and are infrequent or of short duration when for other reasons). o If current dressing causes regression in wound condition, may D/C ordered dressing product/s and apply Normal Saline Moist Dressing daily until next  [facility] / Other MD appointment. [place] of regression in wound condition at [phone]. o Please direct any NON-WOUND related issues/requests for orders to patient's Primary Care Physician Wound #3 Left  Calcaneus o [place] Visits - [place] Nurse may visit PRN to address patientos wound care needs. o FACE TO FACE ENCOUNTER: MEDICARE and MEDICAID PATIENTS: I certify that this patient is under my care and that I had a face-to-face encounter that meets the physician face-to-face encounter requirements with this patient on this date. The encounter with the patient was in whole or in part for the following MEDICAL CONDITION: (primary reason for [place]) MEDICAL NECESSITY: I certify, that based on my findings, NURSING services are a medically necessary home health service. HOME BOUND STATUS: I certify that my clinical findings support that this patient is homebound (i.e., Due to illness or injury, pt requires aid of supportive devices such as crutches, cane, wheelchairs, walkers, the use of special transportation or the assistance of another person to leave their place of residence. There is a normal inability to leave the home SHAYMA, PFEFFERLE. ([id number]) and doing so requires considerable and taxing effort. Other absences are for medical reasons / religious services and are infrequent or of short duration when for other reasons). o If current dressing causes regression in wound condition, may D/C ordered dressing product/s and apply Normal Saline Moist Dressing daily until next [place] / Other MD appointment. [place] of regression in wound condition at [phone]. o Please direct any NON-WOUND related issues/requests for orders to patient's Primary Care Physician Electronic Signature(s) Signed: 11/08/2018 3:45:44 PM By: Montey Hora Signed: 11/11/2018 12:50:09 AM By: Worthy Keeler PA-C Entered By: Montey Hora on 11/08/2018 10:01:26 Courtright, [name] ([id number]) -------------------------------------------------------------------------------- Problem List Details Patient Name: [name] Date of  Service: 11/08/2018 9:00 AM Medical Record Number: [id number] Patient Account Number: [id number] Date of Birth/Sex: 1923-01-06 (84 y.o. F) Treating RN: Montey Hora Primary Care Provider: Grayland Ormond Other Clinician: Referring Provider: Grayland Ormond Treating Provider/Extender: Melburn Hake, HOYT Weeks in Treatment: 16 Active Problems ICD-10 Evaluated Encounter Code Description Active Date Today Diagnosis 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, stage 4 05/10/2018 No Yes L89.524 Pressure ulcer of left ankle, stage 4 04/25/2018 No Yes S90.822A Blister (nonthermal), left foot, initial encounter 10/11/2018 No Yes L89.622 Pressure ulcer of left heel, stage 2 10/11/2018 No Yes I70.233 Atherosclerosis of native arteries of right leg with ulceration of 08/10/2017 No Yes ankle I70.243 Atherosclerosis of native arteries of left leg with ulceration of 07/10/2018 No Yes ankle I89.0 Lymphedema, not elsewhere classified 08/10/2017 No Yes B35.4 Tinea corporis 09/28/2017 No Yes 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 F) Treating RN: Montey Hora Primary Care Provider: Grayland Ormond Other Clinician: Referring Provider: Grayland Ormond Treating Provider/Extender: Melburn Hake, HOYT Weeks in Treatment: 6 Subjective Chief Complaint Information obtained from Patient Patient is here for right lateral malleolus and left lateral malleolus ulcer History of Present  Illness (HPI) 84 year old patient who most recently has been seeing both podiatry and vascular surgery for a long-standing ulcer  of her right lateral malleolus which has been treated with various methodologies. Dr. Amalia Hailey the podiatrist saw her on 07/20/2017 and sent her to the wound center for possible hyperbaric oxygen therapy. past medical history of peripheral vascular disease, varicose veins, status post appendectomy, basal cell carcinoma excision from the left leg, cholecystectomy, pacemaker placement, right lower extremity angiography done by Dr. dew in March 2017 with placement of a stent. there is also note of a successful ablation of the right small saphenous vein done which was reviewed by ultrasound on 10/24/2016. the patient had a right small saphenous vein ablation done on 10/20/2016. The patient has never been a smoker. She has been seen by Dr. Corene Cornea dew the vascular surgeon who most recently saw her on 06/15/2017 for evaluation of ongoing problems with right leg swelling. She had a lower extremity arterial duplex examination done(02/13/17) which showed patent distal right superficial femoral artery stent and above-the-knee popliteal stent without evidence of restenosis. The ABI was more than 1.3 on the right and more than 1.3 on the left. This was consistent with noncompressible arteries due to medial calcification. The right great toe pressure and PPG waveforms are within normal limits and the left great toe pressure and PPG waveforms are decreased. he recommended she continue to wear her compression stockings and continue with elevation. She is scheduled to have a noninvasive arterial study in the near future 08/16/2017 -- had a lower extremity arterial duplex examination done which showed patent distal right superficial femoral artery stent and above-the-knee popliteal stent without evidence of restenosis. The ABI was more than 1.3 on the right and more than 1.3 on the left. This  was consistent with noncompressible arteries due to medial calcification. The right great toe pressure and PPG waveforms are within normal limits and the left great toe pressure and PPG waveforms are decreased. the x-ray of the right ankle has not yet been done 08/24/2017 -- had a right ankle x-ray -- IMPRESSION:1. No fracture, bone lesion or evidence of osteomyelitis. 2. Lateral soft tissue swelling with a soft tissue ulcer. she has not yet seen the vascular surgeon for review 08/31/17 on evaluation today patient's wound appears to be showing signs of improvement. She still with her appointment with vascular in order to review her results of her vascular study and then determine if any intervention would be recommended at that time. No fevers, chills, nausea, or vomiting noted at this time. She has been tolerating the dressing changes without complication. 09/28/17 on evaluation today patient's wound appears to show signs of good improvement in regard to the granulation tissue which is surfacing. There is still a layer of slough covering the wound and the posterior portion is still significantly deeper than the anterior nonetheless there has been some good sign of things moving towards the better. She is going to go back to Dr. dew for reevaluation to ensure her blood flow is still appropriate. That will be before her next evaluation with [place] next week. No fevers, chills, nausea, or vomiting noted at this time. Patient does have some discomfort rated to be a 3-4/10 depending on activity specifically cleansing the wound makes it worse. 10/05/2017 -- the patient was seen by Dr. Lucky Cowboy last week and noninvasive studies showed a normal right ABI with brisk RITHIKA, SEEL. ([id number]) triphasic waveforms consistent with no arterial insufficiency including normal digital pressures. The duplex showed a patent distal right SFA stent and the proximal SFA was also normal. He was pleased with her test  and  thought she should have enough of perfusion for normal wound healing. He would see her back in 6 months time. 12/21/17 on evaluation today patient appears to be doing fairly well in regard to her right lateral ankle wound. Unfortunately the main issue that she is expansion at this point is that she is having some issues with what appears to be some cellulitis in the right anterior shin. She has also been noting a little bit of uncomfortable feeling especially last night and her ankle area. I'm afraid that she made the developing a little bit of an infection. With that being said I think it is in the early stages. 12/28/17 on evaluation today patient's ankle appears to be doing excellent. She's making good progress at this point the cellulitis seems to have improved after last week's evaluation. Overall she is having no significant discomfort which is excellent news. She does have an appointment with Dr. dew on March 29, 2018 for reevaluation in regard to the stent he placed. She seems to have excellent blood flow in the right lower extremity. 01/19/12 on evaluation today patient's wound appears to be doing very well. In fact she does not appear to require debridement at this point, there's no evidence of infection, and overall from the standpoint of the wound she seems to be doing very well. With that being said I believe that it may be time to switch to different dressing away from the [facility] Dressing she tells me she does have a lot going on her friend actually passed away yesterday and she's also having a lot of issues with her husband this obviously is weighing heavy on her as far as your thoughts and concerns today. 01/25/18 on evaluation today patient appears to be doing fairly well in regard to her right lateral malleolus. She has been tolerating the dressing changes without complication. Overall I feel like this is definitely showing signs of improvement as far as how the overall  appearance of the wound is there's also evidence of epithelium start to migrate over the granulation tissue. In general I think that she is progressing nicely as far as the wound is concerned. The only concern she really has is whether or not we can switch to every other week visits in order to avoid having as many appointments as her daughters have a difficult time getting her to her appointments as well as the patient's husband to his he is not doing very well at this point. 02/22/18 on evaluation today patient's right lateral malleolus ulcer appears to be doing great. She has been tolerating the dressing changes without complication. Overall you making excellent progress at this time. Patient is having no significant discomfort. 03/15/18 on evaluation today patient appears to be doing much more poorly in regard to her right lateral ankle ulcer at this point. Unfortunately since have last seen her her husband has passed just a few days ago is obviously weighed heavily on her her daughter also had surgery well she is with her today as usual. There does not appear to be any evidence of infection she does seem to have significant contusion/deep tissue injury to the right lateral malleolus which was not noted previous when I saw her last. It's hard to tell of exactly when this injury occurred although during the time she was spending the night in the hospital this may have been most likely. 03/22/18 on evaluation today patient appears to actually be doing very well in regard to her ulcer.  She did unfortunately have a setback which was noted last week however the good news is we seem to be getting back on track and in fact the wound in the core did still have some necrotic tissue which will be addressed at this point today but in general I'm seeing signs that things are on the up and up. She is glad to hear this obviously she's been somewhat concerned that due to the how her wound digressed more  recently. 03/29/18 on evaluation today patient appears to be doing fairly well in regard to her right lower extremity lateral malleolus ulcer. She unfortunately does have a new area of pressure injury over the inferior portion where the wound has opened up a little bit larger secondary to the pressure she seems to be getting. She does tell me sometimes when she sleeps at night that it actually hurts and does seem to be pushing on the area little bit more unfortunately. There does not appear to be any evidence of infection which is good news. She has been tolerating the dressing changes without complication. She also did have some bruising in the left second and third toes due to the fact that she may have bump this or injured it although she has neuropathy so she does not feel she did move recently that may have been where this came from. Nonetheless there does not appear to be any evidence of infection at this time. 04/12/18 on evaluation today patient's wound on the right lateral ankle actually appears to be doing a little bit better with a lot of necrotic docking tissue centrally loosening up in clearing away. However she does have the beginnings of a deep tissue injury on the left lateral malleolus likely due to the fact we've been trying offload the right as much as we have. I think she may benefit from an assistive soft device to help with offloading and it looks like they're looking at one of the doughnut conditions that wraps around the lower leg to offload which I think will definitely do a good job. With that being said I think we definitely need to address this issue on the left before it becomes a wound. Patient is not having significant pain. BRANDEE, MARKIN ([id number]) 04/19/18 on evaluation today patient appears to be doing excellent in regard to the progress she's made with her right lateral ankle ulcer. The left ankle region which did show evidence of a deep tissue injury seems to  be resolving there's little fluid noted underneath and a blister there's nothing open at this point in time overall I feel like this is progressing nicely which is good news. She does not seem to be having significant discomfort at this point which is also good news. 04/25/18-She is here in follow up evaluation for bilateral lateral malleolar ulcers. The right lateral malleolus ulcer with pale subcutaneous tissue exposure, central area of ulcer with tendon/periosteum exposed. The left lateral malleolus ulcer now with central area of nonviable tissue, otherwise deep tissue injury. She is wearing compression wraps to the left lower extremity, she will place the right lower extremity compression wraps on when she gets home. She will be out of town over the weekend and return next week and follow-up appointment. She completed her doxycycline this morning 05/03/18 on evaluation today patient appears to be doing very well in regard to her right lateral ankle ulcer in general. At least she's showing some signs of improvement in this regard. Unfortunately she has some additional  injury to the left lateral malleolus region which appears to be new likely even over the past several days. Again this determination is based on the overall appearance. With that being said the patient is obviously frustrated about this currently. 05/10/18-She is here in follow-up evaluation for bilateral lateral malleolar ulcers. She states she has purchased offloading shoes/boots and they will arrive tomorrow. She was asked to bring them in the office at next week's appointment so her provider is aware of product being utilized. She continues to sleep on right or left side, she has been encouraged to sleep on her back. The right lateral malleolus ulcer is precariously close to peri-osteum; will order xray. The left lateral malleolus ulcer is improved. Will switch back to santyl; she will follow up next week. 05/17/18 on evaluation  today patient actually appears to be doing very well in regard to her malleolus her ulcers compared to last time I saw them. She does not seem to have as much in the way of contusion at this point which is great news. With that being said she does continue to have discomfort and I do believe that she is still continuing to benefit from the offloading/pressure reducing boots that were recommended. I think this is the key to trying to get this to heal up completely. 05/24/18 on evaluation today patient actually appears to be doing worse at this point in time unfortunately compared to her last week's evaluation. She is having really no increased pain which is good news unfortunately she does have more maceration in your theme and noted surrounding the right lateral ankle the left lateral ankle is not really is erythematous I do not see signs of the overt cellulitis on that side. Unfortunately the wounds do not seem to have shown any signs of improvement since the last evaluation. She also has significant swelling especially on the right compared to previous some of this may be due to infection however also think that she may be served better while she has these wounds by compression wrapping versus continuing to use the Juxta-Lite for the time being. Especially with the amount of drainage that she is experiencing at this point. No fevers, chills, nausea, or vomiting noted at this time. 05/31/18 on evaluation today patient appears to actually be doing better in regard to her right lateral lower extremity ulcer specifically on the malleolus region. She has been tolerating the antibiotic without complication. With that being said she still continues to have issues but a little bit of redness although nothing like she what she was experiencing previous. She still continues to pressure to her ankle area she did get the problem on offloading boots unfortunately she will not wear them she states there too  uncomfortable and she can't get in and out of the bed. Nonetheless at this point her wounds seem to be continually getting worse which is not what we want I'm getting somewhat concerned about her progress and how things are going to proceed if we do not intervene in some way shape or form. I therefore had a very lengthy conversation today about offloading yet again and even made a specific suggestion for switching her to a memory foam mattress and even gave the information for a specific one that they could look at getting if it was something that they were interested in considering. She does not want to be considered for a hospital bed air mattress although honestly insurance would not cover it that she does not have any  wounds on her trunk. 06/14/18 on evaluation today both wounds over the bilateral lateral malleolus her ulcers appear to be doing better there's no evidence of pressure injury at this point. She did get the foam mattress for her bed and this does seem to have been extremely beneficial for her in my pinion. Her daughter states that she is having difficulty getting out of bed because of how soft it is. The patient also relates this to be. Nonetheless I do feel like she's actually doing better. Unfortunately right after and around the time she was getting the mattress she also sustained a fall when she got up to go pick up the phone and ended up injuring her right elbow she has [address] sut[address] place. We are not caring for this currently although home health is going to be taking the sutures out shortly. Nonetheless this may be something that we need to evaluate going forward. It depends on how well it has or has not healed in the end. She also recently saw an orthopedic specialist for an injection in the right shoulder just before her fall unfortunately the fall seems to have worsened her pain. 06/21/18 on evaluation today patient appears to be doing about the same in regard to her lateral  malleolus ulcers. Both appear to be just a little bit deeper but again we are clinging away the necrotic and dead tissue which I think is why this is progressing towards a deeper realm as opposed to improving from my measurement standpoint in that regard. Nonetheless she has been tolerating the dressing changes she absolutely hates the memory foam mattress topper that was obtained for her nonetheless Latasha, Buczkowski [name] ([id number]) I do believe this is still doing excellent as far as taking care of excess pressure in regard to the lateral malleolus regions. She in fact has no pressure injury that I see whereas in weeks past it was week by week I was constantly seeing new pressure injuries. Overall I think it has been very beneficial for her. 07/03/18; patient arrives in my clinic today. She has deep punched out areas over her bilateral lateral malleoli. The area on the right has some more depth. We spent a lot of time today talking about pressure relief for these areas. This started when her daughter asked for a prescription for a memory foam mattress. I have never written a prescription for a mattress and I don't think insurances would pay for that on an ordinary bed. In any case he came up that she has foam boots that she refuses to wear. I would suggest going to these before any other offloading issues when she is in bed. They say she is meticulous about offloading this the rest of the day 07/10/18- She is seen in follow-up evaluation for bilateral, lateral malleolus ulcers. There is no improvement in the ulcers. She has purchased and is sleeping on a memory foam mattress/overlay, she has been using the offloading boots nightly over the past week. She has a follow up appointment with vascular medicine at the end of October, in my opinion this follow up should be expedited given her deterioration and suboptimal TBI results. We will order plain film xray of the left ankle as deeper structures are  palpable; would consider having MRI, regardless of xray report(s). The ulcers will be treated with iodoflex/iodosorb, she is unable to safely change the dressings daily with santyl. 07/19/18 on evaluation today patient appears to be doing in general visually well in regard to  her bilateral lateral malleolus ulcers. She has been tolerating the dressing changes without complication which is good news. With that being said we did have an x-ray performed on 07/12/18 which revealed a slight loosen see in the lateral portion of the distal left fibula which may represent artifact but underline lytic destruction or osteomyelitis could not be excluded. MRI was recommended. With that being said we can see about getting the patient scheduled for an MRI to further evaluate this area. In fact we have that scheduled currently for August 20 19,019. 07/26/18 on evaluation today patient's wound on the right lateral ankle actually appears to be doing fairly well at this point in my pinion. She has made some good progress currently. With that being said unfortunately in regard to the left lateral ankle ulcer this seems to be a little bit more problematic at this time. In fact as I further evaluated the situation she actually had bone exposed which is the first time that's been the case in the bone appear to be necrotic. Currently I did review patient's note from Dr. Bunnie Domino office with [facility]. He stated that ABI was 1.26 on the right and 0.95 on the left with good waveforms. Her perfusion is stable not reduced from previous studies and her digital waveforms were pretty good particularly on the right. His conclusion upon review of the note was that there was not much she could do to improve her perfusion and he felt she was adequate for wound healing. His suggestion was that she continued to see [place] and consider a synthetic skin graft if there was no underlying infection. He plans to see her back in  six months or as needed. 08/01/18 on evaluation today patient appears to be doing better in regard to her right lateral ankle ulcer. Her left lateral ankle ulcer is about the same she still has bone involvement in evidence of necrosis. There does not appear to be evidence of infection at this time On the right lateral lower extremity. I have started her on the Augmentin she picked this up and started this yesterday. This is to get her through until she sees infectious disease which is scheduled for 08/12/18. 08/06/18 on evaluation today patient appears to be doing rather well considering my discussion with patient's daughter at the end of last week. The area which was marked where she had erythema seems to be improved and this is good news. With that being said overall the patient seems to be making good improvement when it comes to the overall appearance of the right lateral ankle ulcer although this has been slow she at least is coming around in this regard. Unfortunately in regard to the left lateral ankle ulcer this is osteomyelitis based on the pathology report as well is bone culture. Nonetheless we are still waiting CT scan. Unfortunately the MRI we originally ordered cannot be performed as the patient is a pacemaker which I had overlooked. Nonetheless we are working on the CT scan approval and scheduling as of now. She did go to the hospital over the weekend and was placed on IV Cefzo for a couple of days. Fortunately this seems to have improved the erythema quite significantly which is good news. There does not appear to be any evidence of worsening infection at this time. She did have some bleeding after the last debridement therefore I did not perform any sharp debridement in regard to left lateral ankle at this point. Patient has been approved  for a snap vac for the right lateral ankle. 08/14/18; the patient with wounds over her bilateral lateral malleoli. The area on the right actually looks  quite good. Been using a snap back on this area. Healthy granulation and appears to be filling in. Unfortunately the area on the left is really problematic. 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 Also noted to have cellulitis. She saw Dr. Novella Olive of infectious disease today and was put on linezolid. We are able to verify this with her pharmacy. She is completed the Augmentin that she was already on. We've been using Iodoflex to this MILESSA, HOGAN ([id number]) area 08/23/18 on evaluation today patient's wounds both actually appear to be doing better compared to my prior evaluations. Fortunately she showing signs of good improvement in regard to the overall wound status especially where were using the snap vac on the right. In regard to left lateral malleolus the wound bed actually appears to be much cleaner than previously noted. I do not feel any phone directly probed during evaluation today and though there is tendon noted this does not appear to be necrotic it's actually fairly good as far as the overall appearance of the tendon is concerned. In general the wound bed actually appears to be doing significantly better than it was previous. Patient is currently in the care of [name] and I did review that note today. He actually has her on two weeks of linezolid and then following the patient will be on 1-2 months of Keflex. That is the plan currently. She has been on antibiotics therapy as prescribed by myself initially starting on July 30, 2018 and has been on that continuously up to this point. 08/30/18 on evaluation today patient actually appears to be doing much better in regard to her right lateral malleolus ulcer. She has been tolerating the dressing changes specifically the snap vac without complication although she did have some issues with the seal currently. Apparently there was some trouble with  getting it to maintain over the past week past Sunday. Nonetheless overall the wound appears better in regard to the right lateral malleolus region. In regard to left lateral malleolus this actually show some signs of additional granulation although there still tendon noted in the base of the wound this appears to be healthy not necrotic in any way whatsoever. We are considering potentially using a snap vac for the left lateral malleolus as well the product wrap from [employer], [employer], was present in the clinic today we're going to see this patient I did have her come in with me after obtaining consent from the patient and her daughter in order to look at the wound and see if there's any recommendation one way or another as to whether or not they felt the snapback could be beneficial for the left lateral malleolus region. But the conclusion was that it might be but that this is definitely a little bit deeper wound than what traditionally would be utilized for a snap vac. 09/06/18 on evaluation today patient actually appears to be doing excellent in my pinion in regard to both ankle ulcers. She has been tolerating the dressing changes without complication which is great news. Specifically we have been using the snap vac. In regard to the right ankle I'm not even sure that this is going to be necessary for today and following as the wound has filled in quite nicely. In  regard to the left ankle I do believe that we're seeing excellent epithelialization from the edge as well as granulation in the central portion the tendon is still exposed but there's no evidence of necrotic bone and in general I feel like the patient has made excellent progress even compared to last week with just one week of the snap vac. 09/11/18; this is a patient who has wounds on her bilateral lateral malleoli. Initially both of these were deep stage IV wounds in the setting of chronic arterial insufficiency. She has been revascularized.  As I understand think she been using snap vacs to both of these wounds however the area on the right became more superficial and currently she is only using it on the left. Using silver collagen on the right and silver collagen under the back on the left I believe 09/19/18 on evaluation today patient actually appears to be doing very well in regard to her lateral malleolus or ulcers bilaterally. She has been tolerating the dressing changes without complication. Fortunately there does not appear to be any evidence of infection at this time. Overall I feel like she is improving in an excellent manner and I'm very pleased with the fact that everything seems to be turning towards the better for her. This has obviously been a long road. 09/27/18 on evaluation today patient actually appears to be doing very well in regard to her bilateral lateral malleolus ulcers. She has been tolerating the dressing changes without complication. Fortunately there does not appear to be any evidence of infection at this time which is also great news. No fevers, chills, nausea, or vomiting noted at this time. Overall I feel like she is doing excellent with the snap vac on the left malleolus. She had 40 mL of fluid collection over the past week. 10/04/18 on evaluation today patient actually appears to be doing well in regard to her bilateral lateral malleolus ulcers. She continues to tolerate the dressing changes without complication. One issue that I see is the snap vac on the left lateral malleolus which appears to have sealed off some fluid underlying this area and has not really allowed it to heal to the degree that I would like to see. For that reason I did suggest at this point we may want to pack a small piece of packing strip into this region to allow it to more effectively wick out fluid. 10/11/18 in general the patient today does not feel that she has been doing very well. She's been a little bit lethargic  and subsequently is having bodyaches as well according to what she tells me today. With that being said overall she has been concerned with the fact that something may be worsening although to be honest her wounds really have not been appearing poorly. She does have a new ulcer on her left heel unfortunately. This may be pressure related. Nonetheless it seems to me to have potentially started at least as a blister I do not see any evidence of deep tissue injury. In regard to the left ankle the snap vac still seems to be causing the ceiling off of the deeper part of the wound which is in turn trapping fluid. I'm not extremely pleased with the overall appearance as far as progress from last week to this week therefore I'm gonna discontinue Mignano, [name] ([id number]) the snap vac at this point. 10/18/18 patient unfortunately this point has not been feeling well for the past several days. She was seen by Corene Cornea  Whitaker her primary care provider who is a Librarian, academic at [facility]. Subsequently she states that she's been very weak and generally feeling malaise. No fevers, chills, nausea, or vomiting noted at this time. With that being said bloodwork was performed at the PCP office on the 11th of this month which showed a white blood cell count of 10.7. This was repeated today and shows a white blood cell count of 12.4. This does show signs of worsening. Coupled with the fact that she is feeling worse and that her left ankle wound is not really showing signs of improvement I feel like this is an indication that the osteomyelitis is likely exacerbating not improving. Overall I think we may also want to check her C-reactive protein and sedimentation rate. Actually did call [facility] office this afternoon while the patient was in the office here with me. Subsequently based on the findings we discussed treatment possibilities and I think that it is appropriate for [place] to go ahead and  initiate treatment with doxycycline which I'm going to do. Subsequently he did agree to see about adding a CRP and sedimentation rate to her orders. If that has not already been drawn to where they can run it they will contact the patient she can come back to have that check. They are in agreement with plan as far as the patient and her daughter are concerned. Nonetheless also think we need to get in touch with Dr. Henreitta Leber office to see about getting the patient scheduled with him as soon as possible. 11/08/18 on evaluation today patient presents for follow-up concerning her bilateral foot and ankle ulcers. I did do an extensive review of her chart in epic today. Subsequently she was seen by [name] he did initiate Cefepime IV antibiotic therapy. Subsequently she had some issues with her PICC line this had to be removed because it was coiled and then replaced. Fortunately that was now settled. Unfortunately she has continued have issues with her left heel as well as the issues that she is experiencing with her bilateral lateral malleolus regions. I do believe however both areas seem to be doing a little bit better on evaluation today which is good news. No fevers, chills, nausea, or vomiting noted at this time. She actually has an angiogram schedule with Dr. dew on this coming Monday, November 11, 2018. Subsequently the patient states that she is feeling much better especially than what she was roughly 2 weeks ago. She actually had to cancel an appointment because she was feeling so poorly. No fevers, chills, nausea, or vomiting noted at this time. Patient History Information obtained from Patient. Family History Cancer - Father,Siblings, Heart Disease - Siblings, No family history of Diabetes, Hereditary Spherocytosis, Hypertension, Kidney Disease, Lung Disease, Seizures, Stroke, Thyroid Problems, Tuberculosis. Social History Never smoker, Marital Status - Married, Alcohol Use - Never, Drug Use  - No History, Caffeine Use - Rarely. Review of Systems (ROS) Constitutional Symptoms (General Health) Denies complaints or symptoms of Fever, Chills. Respiratory The patient has no complaints or symptoms. Cardiovascular Complains or has symptoms of LE edema. Psychiatric The patient has no complaints or symptoms. Objective Lanelle, Lindo [name] ([id number]) Constitutional Well-nourished and well-hydrated in no acute distress. Vitals Time Taken: 9:16 AM, Height: 65 in, Weight: 154.3 lbs, BMI: 25.7, Temperature: 98.1 F, Pulse: 75 bpm, Respiratory Rate: 16 breaths/min, Blood Pressure: 141/57 mmHg. Respiratory normal breathing without difficulty. clear to auscultation bilaterally. Cardiovascular regular rate and rhythm with normal S1, S2. Psychiatric this  patient is able to make decisions and demonstrates good insight into disease process. Alert and Oriented x 3. pleasant and cooperative. General Notes: At this point the patient's wound beds actually appear to be doing fairly well. The right lateral malleolus is still open although there was some eschar covering I did have to remove this along with [facility] and subcutaneous tissue to get down to a good level. She tolerated this without complication this again with the right lower extremity. With that being said I did mechanically debride the left lateral malleolus region. And I did perform a selective debridement of the left heel to remove some of the dead tissue surrounding no bleeding occurred during this removal. Obviously I am somewhat concerned about the possibility of issues with arterial flow which is why no significant and more aggressive debridement was undertaken today. She does have bilateral lower [place] edema although with the questionable arterial findings by [facility] I'm still somewhat reluctant to have her even with her compression stocking. Integumentary (Hair, Skin) Wound #1 status is Open. Original cause  of wound was Gradually Appeared. The wound is located on the Right,Lateral Malleolus. The wound measures 0.1cm length x 0.1cm width x 0.1cm depth; 0.008cm^2 area and 0.001cm^3 volume. There is Fat Layer (Subcutaneous Tissue) Exposed exposed. There is no tunneling or undermining noted. There is a small amount of serous drainage noted. The wound margin is distinct with the outline attached to the wound base. There is no granulation within the wound bed. There is a medium (34-66%) amount of necrotic tissue within the wound bed including Adherent Slough. The periwound skin appearance exhibited: Rubor. The periwound skin appearance did not exhibit: Callus, Crepitus, Excoriation, Induration, Rash, Scarring, Dry/Scaly, Maceration, Atrophie Blanche, Cyanosis, Ecchymosis, Hemosiderin Staining, Mottled, Pallor, Erythema. Periwound temperature was noted as No Abnormality. The periwound has tenderness on palpation. Wound #2 status is Open. Original cause of wound was Gradually Appeared. The wound is located on the Left,Lateral Malleolus. The wound measures 1.2cm length x 1cm width x 0.5cm depth; 0.942cm^2 area and 0.471cm^3 volume. There is Fat Layer (Subcutaneous Tissue) Exposed exposed. There is no tunneling or undermining noted. There is a medium amount of serous drainage noted. The wound margin is epibole. There is small (1-33%) pink, pale granulation within the wound bed. There is a large (67-100%) amount of necrotic tissue within the wound bed including Adherent Slough. The periwound skin appearance exhibited: Maceration. The periwound skin appearance did not exhibit: Callus, Crepitus, Excoriation, Induration, Rash, Scarring, Dry/Scaly, Atrophie Blanche, Cyanosis, Ecchymosis, Hemosiderin Staining, Mottled, Pallor, Rubor, Erythema. Periwound temperature was noted as No Abnormality. The periwound has tenderness on palpation. Wound #3 status is Open. Original cause of wound was Gradually Appeared. The wound  is located on the Left Calcaneus. The wound measures 2cm length x 2.5cm width x 0.1cm depth; 3.927cm^2 area and 0.393cm^3 volume. The wound is limited to skin breakdown. There is no tunneling or undermining noted. There is a large amount of serous drainage noted. The wound margin is indistinct and nonvisible. There is no granulation within the wound bed. There is a small (1-33%) amount of necrotic tissue within the wound bed including Eschar and Adherent Slough. The periwound skin appearance exhibited: Maceration. The periwound skin appearance did not exhibit: Callus, Crepitus, Excoriation, Induration, Rash, Scarring, Dry/Scaly, Atrophie Blanche, Cyanosis, Ecchymosis, Hemosiderin Staining, Mottled, Pallor, Rubor, Erythema. Periwound temperature was noted as No Abnormality. The periwound has tenderness on palpation. 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 There was a Excisional Skin/Subcutaneous Tissue Debridement with a total area of 0.4 sq cm performed by STONE III, HOYT E., PA-C. With the following instrument(s): Curette, and Rongeur to remove Viable and Non-Viable tissue/material. Material removed includes Eschar, Subcutaneous Tissue, Slough, and Fibrin/Exudate after achieving pain control using Lidocaine 4% Topical Solution. No specimens were taken. A time out was conducted at  10:05, prior to the start of the procedure. There was no bleeding. The procedure was tolerated well with a pain level of 0 throughout and a pain level of 0 following the procedure. Post Debridement Measurements: 0.5cm length x 0.5cm width x 0.2cm depth; 0.039cm^3 volume. Post debridement Stage noted as Category/Stage IV. Character of Wound/Ulcer Post Debridement is improved. Severity of Tissue Post Debridement is: Fat layer exposed. Post procedure Diagnosis Wound #1: Same as Pre-Procedure Wound #3 Pre-procedure diagnosis of Wound #3 is a Pressure Ulcer located on the Left Calcaneus . There was a Selective/Open Wound Skin/Epidermis Debridement with a total area of 5 sq cm performed by STONE III, HOYT E., PA-C. With the following instrument(s): Forceps, and Scissors to remove Non-Viable tissue/material. Material removed includes Skin: Dermis and Skin: Epidermis and after achieving pain control using Lidocaine 4% Topical Solution. No specimens were taken. A time out was conducted at 09:57, prior to the start of the procedure. There was no bleeding. The procedure was tolerated well with a pain level of 0 throughout and a pain level of 0 following the procedure. Post Debridement Measurements: 2cm length x 2.5cm width x 0.1cm depth; 0.393cm^3 volume. Post debridement Stage noted as Category/Stage II. Character of Wound/Ulcer Post Debridement is improved. Post procedure Diagnosis Wound #3: Same as Pre-Procedure Plan Wound Cleansing: Wound #1 Right,Lateral Malleolus: Clean wound with Normal Saline. Cleanse wound with mild soap and water May Shower, gently pat wound dry prior to applying new dressing. RYDER, CHESMORE ([id number]) Wound #2 Left,Lateral Malleolus: Clean wound with Normal Saline. Cleanse wound with mild soap and water May Shower, gently pat wound dry prior to applying new dressing. Wound #3 Left Calcaneus: Clean wound with Normal Saline. Cleanse wound with mild soap and  water May Shower, gently pat wound dry prior to applying new dressing. Anesthetic (add to Medication List): Wound #1 Right,Lateral Malleolus: Topical Lidocaine 4% cream applied to wound bed prior to debridement (In Clinic Only). Wound #2 Left,Lateral Malleolus: Topical Lidocaine 4% cream applied to wound bed prior to debridement (In Clinic Only). Wound #3 Left Calcaneus: Topical Lidocaine 4% cream applied to wound bed prior to debridement (In Clinic Only). Primary Wound Dressing: Wound #1 Right,Lateral Malleolus: Silver Collagen Wound #2 Left,Lateral Malleolus: Silver Alginate Wound #3 Left Calcaneus: Silver Alginate Secondary Dressing: Wound #1 Right,Lateral Malleolus: Boardered Foam Dressing Wound #2 Left,Lateral Malleolus: ABD and Kerlix/Conform - secure with netting Wound #3 Left Calcaneus: ABD and Kerlix/Conform - secure with netting Dressing Change Frequency: Wound #1 Right,Lateral Malleolus: Change Dressing Monday, Wednesday, Friday Wound #2 Left,Lateral Malleolus: Change Dressing Monday, Wednesday, Friday Wound #3 Left Calcaneus: Change Dressing Monday,  Wednesday, Friday Follow-up Appointments: Wound #1 Right,Lateral Malleolus: Return Appointment in 1 week. Wound #2 Left,Lateral Malleolus: Return Appointment in 1 week. Wound #3 Left Calcaneus: Return Appointment in 1 week. Edema Control: Wound #1 Right,Lateral Malleolus: Patient to wear own compression stockings Patient to wear own Velcro compression garment. Elevate legs to the level of the heart and pump ankles as often as possible Wound #2 Left,Lateral Malleolus: Patient to wear own compression stockings Patient to wear own Velcro compression garment. Elevate legs to the level of the heart and pump ankles as often as possible Off-Loading: Wound #1 Right,Lateral Malleolus: Turn and reposition every 2 hours Other: - do not put pressure on your ankles Wound #2 Left,Lateral Malleolus: Turn and reposition  every 2 hours Other: - do not put pressure on your ankles Additional Orders / Instructions: GAYLIN, OSORIA ([id number]) Wound #1 Right,Lateral Malleolus: Increase protein intake. Wound #2 Left,Lateral Malleolus: Increase protein intake. Home Health: Wound #1 Right,Lateral Malleolus: [place] Visits - [place] Nurse may visit PRN to address patient s wound care needs. FACE TO FACE ENCOUNTER: MEDICARE and MEDICAID PATIENTS: I certify that this patient is under my care and that I had a face-to-face encounter that meets the physician face-to-face encounter requirements with this patient on this date. The encounter with the patient was in whole or in part for the following MEDICAL CONDITION: (primary reason for [place]) MEDICAL NECESSITY: I certify, that based on my findings, NURSING services are a medically necessary home health service. HOME BOUND STATUS: I certify that my clinical findings support that this patient is homebound (i.e., Due to illness or injury, pt requires aid of supportive devices such as crutches, cane, wheelchairs, walkers, the use of special transportation or the assistance of another person to leave their place of residence. There is a normal inability to leave the home and doing so requires considerable and taxing effort. Other absences are for medical reasons / religious services and are infrequent or of short duration when for other reasons). If current dressing causes regression in wound condition, may D/C ordered dressing product/s and apply Normal Saline Moist Dressing daily until next [place] / Other MD appointment. [place] of regression in wound condition at [phone]. Please direct any NON-WOUND related issues/requests for orders to patient's Primary Care Physician Wound #2 Left,Lateral Malleolus: [place] Visits - [place] Nurse may visit PRN to address patient s  wound care needs. FACE TO FACE ENCOUNTER: MEDICARE and MEDICAID PATIENTS: I certify that this patient is under my care and that I had a face-to-face encounter that meets the physician face-to-face encounter requirements with this patient on this date. The encounter with the patient was in whole or in part for the following MEDICAL CONDITION: (primary reason for [place]) MEDICAL NECESSITY: I certify, that based on my findings, NURSING services are a medically necessary home health service. HOME BOUND STATUS: I certify that my clinical findings support that this patient is homebound (i.e., Due to illness or injury, pt requires aid of supportive devices such as crutches, cane, wheelchairs, walkers, the use of special transportation or the assistance of another person to leave their place of residence. There is a normal inability to leave the home and doing so requires considerable and taxing effort. Other absences are for medical reasons / religious services and are infrequent or of short duration when for other reasons). If current dressing causes regression in wound condition, may D/C ordered dressing product/s  and apply Normal Saline Moist Dressing daily until next [place] / Other MD appointment. [place] of regression in wound condition at [phone]. Please direct any NON-WOUND related issues/requests for orders to patient's Primary Care Physician Wound #3 Left Calcaneus: [place] Visits - [place] Nurse may visit PRN to address patient s wound care needs. FACE TO FACE ENCOUNTER: MEDICARE and MEDICAID PATIENTS: I certify that this patient is under my care and that I had a face-to-face encounter that meets the physician face-to-face encounter requirements with this patient on this date. The encounter with the patient was in whole or in part for the following MEDICAL CONDITION: (primary reason for [place]) MEDICAL  NECESSITY: I certify, that based on my findings, NURSING services are a medically necessary home health service. HOME BOUND STATUS: I certify that my clinical findings support that this patient is homebound (i.e., Due to illness or injury, pt requires aid of supportive devices such as crutches, cane, wheelchairs, walkers, the use of special transportation or the assistance of another person to leave their place of residence. There is a normal inability to leave the home and doing so requires considerable and taxing effort. Other absences are for medical reasons / religious services and are infrequent or of short duration when for other reasons). If current dressing causes regression in wound condition, may D/C ordered dressing product/s and apply Normal Saline Moist Dressing daily until next [place] / Other MD appointment. [place] of regression in wound condition at [phone]. Please direct any NON-WOUND related issues/requests for orders to patient's Primary Care Physician Currently my suggestion is gonna be that we come do a couple of things at this point. First and foremost we will continue with the Prisma for the right lateral malleolus. We will also continue the silver alginate for the left lateral malleolus. In regard to the heel I'm gonna recommend we continue with the silver alginate dressing. Finally we will keep an eye on the toes which to Mabie, [name] ([id number]) varying degrees shows some signs of what appears to be pressure injury although this is likely due to the fact that she has some limitations in her arterial flow at least in the distal part of her microvascular flow. Subsequently we're gonna see were things stand at follow-up in one weeks time. She's in agreement this plan in the interim she will have the angiogram with Dr. dew as well. Please see above for specific wound care orders. We will see patient for re-evaluation in 1 week(s)  here in the clinic. If anything worsens or changes patient will contact our office for additional recommendations. Electronic Signature(s) Signed: 11/11/2018 12:50:09 AM By: Worthy Keeler PA-C Entered By: Worthy Keeler on 11/08/2018 10:20:15 Joni Fears [name] ([id number]) -------------------------------------------------------------------------------- ROS/PFSH Details Patient Name: [name] Date of Service: 11/08/2018 9:00 AM Medical Record Number: [id number] Patient Account Number: [id number] Date of Birth/Sex: [birth year] (84 y.o. F) Treating RN: Montey Hora Primary Care Provider: Grayland Ormond Other Clinician: Referring Provider: Grayland Ormond Treating Provider/Extender: Melburn Hake, HOYT Weeks in Treatment: 73 Information Obtained From Patient Wound History Do you currently have one or more open woundso Yes How many open wounds do you currently haveo 1 Approximately how long have you had your woundso 2 yrs How have you been treating your wound(s) until nowo mupirocin, soaking in epsom salt Has your wound(s) ever healed and then re-openedo No Have you had any lab work done in  the past montho No Have you tested positive for an antibiotic resistant organism (MRSA, VRE)o No Have you tested positive for osteomyelitis (bone infection)o No Have you had any tests for circulation on your legso Yes Who ordered the testo Dr. Lucky Cowboy Where was the test doneo [facility] Constitutional Symptoms (General Health) Complaints and Symptoms: Negative for: Fever; Chills Cardiovascular Complaints and Symptoms: Positive for: LE edema Medical History: Positive for: Congestive Heart Failure; Hypertension Eyes Medical History: Positive for: Cataracts - surgery Respiratory Complaints and Symptoms: No Complaints or Symptoms Musculoskeletal Medical History: Positive for: Osteoarthritis Neurologic Medical History: Positive for: Neuropathy [place], [place] [name]  ([id number]) Oncologic Medical History: Negative for: Received Chemotherapy; Received Radiation Psychiatric Complaints and Symptoms: No Complaints or Symptoms HBO Extended History Items Eyes: Cataracts Immunizations Pneumococcal Vaccine: Received Pneumococcal Vaccination: Yes Implantable Devices Family and Social History Cancer: Yes - Father,Siblings; Diabetes: No; Heart Disease: Yes - Siblings; Hereditary Spherocytosis: No; Hypertension: No; Kidney Disease: No; Lung Disease: No; Seizures: No; Stroke: No; Thyroid Problems: No; Tuberculosis: No; Never smoker; Marital Status - Married; Alcohol Use: Never; Drug Use: No History; Caffeine Use: Rarely; Financial Concerns: No; Food, Clothing or Shelter Needs: No; Support System Lacking: No; Transportation Concerns: No; Advanced Directives: No; Patient does not want information on Advanced Directives; Do not resuscitate: No; Living Will: Yes (Not Provided); Medical Power of Attorney: No Physician Affirmation I have reviewed and agree with the above information. Electronic Signature(s) Signed: 11/08/2018 3:45:44 PM By: Montey Hora Signed: 11/11/2018 12:50:09 AM By: Worthy Keeler PA-C Entered By: Worthy Keeler on 11/08/2018 10:17:44 Mcmath, [name] ([id number]) -------------------------------------------------------------------------------- SuperBill Details Patient Name: [name] Date of Service: 11/08/2018 Medical Record Number: [id number] Patient Account Number: [id number] Date of Birth/Sex: 03-31-1923 (84 y.o. 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, stage 4 L89.524 Pressure ulcer of left ankle, stage 4 S90.822A Blister (nonthermal), left foot, initial encounter Q33.354 Pressure  ulcer of left heel, stage 2 I70.233 Atherosclerosis of native arteries of right leg with ulceration of ankle I70.243 Atherosclerosis of native arteries of left leg with ulceration of ankle I89.0 Lymphedema, not elsewhere classified B35.4 Tinea corporis 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, left ankle and foot Facility Procedures CPT4 Code: 56256389 Description: 11042 - DEB SUBQ TISSUE 20 SQ CM/< ICD-10 Diagnosis Description 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, stage 4 Modifier: Quantity: 1 CPT4 Code: 37342876 Description: 81157 - DEBRIDE WOUND 1ST 20 SQ CM OR < ICD-10 Diagnosis Description L89.622 Pressure ulcer of left heel, stage 2 Modifier: Quantity: 1 Physician Procedures CPT4 Code: 2620355 Description: 99214 - WC PHYS LEVEL 4 - EST PT ICD-10 Diagnosis Description 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 ([id number]) Modifier: Quantity: 1 Electronic Signature(s) Signed: 11/11/2018 12:50:09 AM By: Worthy Keeler PA-C Entered By: Worthy Keeler on 11/08/2018 10:20:57

## 2018-11-11 NOTE — Op Note (Signed)
[facility]  Percutaneous Study/Intervention Procedural Note   Date of Surgery: 11/11/2018  Surgeon(s):,    Assistants:none  Pre-operative Diagnosis: PAD with ulceration left lower extremity  Post-operative diagnosis:  Same  Procedure(s) Performed:             1.  Ultrasound guidance for vascular access right femoral artery             2.  Catheter placement into left SFA from right femoral approach             3.  Aortogram and selective left lower extremity angiogram             4.  Percutaneous transluminal angioplasty of left anterior tibial artery with 2.5 mm diameter by 22 cm length angioplasty balloon             5.   Percutaneous transluminal angioplasty of the left SFA and popliteal artery with 5 mm diameter by 22 cm length Lutonix drug-coated angioplasty balloon proximally and 4 mm diameter by 15 cm length Lutonix drug-coated angioplasty balloon distally  6.  Percutaneous transluminal angioplasty of the left posterior tibial artery with 2 inflations with a 2.5 mm diameter by 22 centimeters length angioplasty balloon             7.  StarClose closure device right femoral artery  EBL: 5 cc  Contrast: 65 cc  Fluoro Time: 8.4 minutes  Moderate Conscious Sedation Time: approximately 45 minutes using 2 mg of Versed and 50 Mcg of Fentanyl              Indications:  Patient is a 84 y.o.female with worsening nonhealing ulceration of the left foot and ankle with a known history of peripheral arterial disease status post previous treatments. The patient is brought in for angiography for further evaluation and potential treatment.  Due to the limb threatening nature of the situation, angiogram was performed for attempted limb salvage. The patient is aware that if the procedure fails, amputation would be expected.  The patient also understands that even with successful revascularization, amputation may still be required due to the severity of the situation.   Risks and benefits are discussed and informed consent is obtained.   Procedure:  The patient was identified and appropriate procedural time out was performed.  The patient was then placed supine on the table and prepped and draped in the usual sterile fashion. Moderate conscious sedation was administered during a face to face encounter with the patient throughout the procedure with my supervision of the RN administering medicines and monitoring the patient's vital signs, pulse oximetry, telemetry and mental status throughout from the start of the procedure until the patient was taken to the recovery room. Ultrasound was used to evaluate the right common femoral artery.  It was patent .  A digital ultrasound image was acquired.  A Seldinger needle was used to access the right common femoral artery under direct ultrasound guidance and a permanent image was performed.  A 0.035 J wire was advanced without resistance and a 5Fr sheath was placed.  Pigtail catheter was placed into the aorta and an AP aortogram was performed. This demonstrated bilateral stenosis of >60% of the renal arteries and normal aorta and iliac segments without significant stenosis. I then crossed the aortic bifurcation and advanced to the left femoral head and then into the proximal left SFA. Selective left lower extremity angiogram was then performed. This demonstrated that the common femoral artery was  normal as was the profunda femorus artery.  The SFA was diffusely diseased with multiple areas of 60-80% stenosis.  The popliteal artery was then occluded with reconstitution of the TP trunk and the anterior tibial artery.  The peroneal artery was continuous without focal stenosis.  The AT had an 80% stenosis in the mid segment and several areas of 60-80% stenosis in the proximal and mid to distal segment.  The posterior tibial artery had multiple areas of moderate to high grade stenosis in the proximal and mid segment and near occlusive stenosis  in the distal segment above the ankle but then continued into the foot. It was felt that it was in the patient's best interest to proceed with intervention after these images to avoid a second procedure and a larger amount of contrast and fluoroscopy based off of the findings from the initial angiogram. The patient was systemically heparinized and a 6 [place] Ansell sheath was then placed over the [employer] wire. I then used a Kumpe catheter and the advantage wire to navigate through the SFA stenoses in the popliteal occlusion and confirm intraluminal flow in the tibioperoneal trunk.  At that point, I exchanged for a 0.018 wire and pulled the catheter back slightly and was able to navigate down into the anterior tibial artery.  Using the V 18 wire and the Kumpe catheter across the stenoses without difficulty and parked the wire in the foot.  We then proceeded with treatment.  A 2.5 mm diameter by 22 cm length angioplasty balloon was inflated from the proximal to mid anterior tibial artery down to the mid to distal anterior tibial artery.  This was inflated to 12 atm for 1 minute.  Completion imaging showed no greater than 30% residual stenosis with brisk flow to the foot.  I then treated the popliteal artery with a 4 mm diameter by 15 cm length Lutonix drug-coated angioplasty balloon inflated to 10 atm for 1 minute.  The mid to distal SFA was then treated with a 5 mm diameter by 22 cm length Lutonix drug-coated angioplasty balloon inflated to 8 atm for 1 minute.  Completion imaging the SFA and popliteal artery showed mild residual stenosis in the 25 to 30% range in both the mid SFA and the above-knee popliteal artery.  Due to the wound, I used a Kumpe catheter and an advantage wire to get into the posterior tibial artery.  After we confirmed intraluminal flow, I exchanged for the 0.018 wire and cross the stenoses and parked the wire in the foot.  2 inflations with a 2.5 mm diameter by 22 cm length angioplasty  balloon were used to treat the posterior tibial artery from the ankle up to the tibioperoneal trunk to encompass the multiple areas of stenosis.  Each inflation was 6 to 8 atm for 1 minute.  Completion imaging showed about a 30 to 40% residual stenosis in the mid segment and about a 30% residual stenosis in the distal segment but the flow was continuous to the foot. I elected to terminate the procedure. The sheath was removed and StarClose closure device was deployed in the right femoral artery with excellent hemostatic result. The patient was taken to the recovery room in stable condition having tolerated the procedure well.  Findings:               Aortogram:  This demonstrated bilateral stenosis of >60% of the renal arteries and normal aorta and iliac segments without significant stenosis  Left Lower Extremity:  The common femoral artery was normal as was the profunda femorus artery.  The SFA was diffusely diseased with multiple areas of 60-80% stenosis.  The popliteal artery was then occluded with reconstitution of the TP trunk and the anterior tibial artery.  The peroneal artery was continuous without focal stenosis.  The AT had an 80% stenosis in the mid segment and several areas of 60-80% stenosis in the proximal and mid to distal segment.  The posterior tibial artery had multiple areas of moderate to high grade stenosis in the proximal and mid segment and near occlusive stenosis in the distal segment above the ankle but then continued into the foot.     Disposition: Patient was taken to the recovery room in stable condition having tolerated the procedure well.  Complications: None  Leotis Pain 11/11/2018 10:49 AM   This note was created with Dragon Medical transcription system. Any errors in dictation are purely unintentional.

## 2018-11-11 NOTE — Discharge Instructions (Signed)
° °  [facility] ° °Discharge Instructions ° °Lower Extremity Angiogram; Angioplasty/Stenting ° °Please refer to the following instructions for your post-procedure care. Your surgeon or physician assistant will discuss any changes with you. ° °Activity ° °Avoid lifting more than 8 pounds (1 gallons of milk) for 72 hours (3 days) after your procedure. You may walk as much as you can tolerate. It's OK to drive after 72 hours. ° °Bathing/Showering ° °You may shower the day after your procedure. If you have a bandage, you may remove it at 24- 48 hours. Clean your incision site with mild soap and water. Pat the area dry with a clean towel. ° °Diet ° °Resume your pre-procedure diet. There are no special food restrictions following this procedure. All patients with peripheral vascular disease should follow a low fat/low cholesterol diet. In order to heal from your surgery, it is CRITICAL to get adequate nutrition. Your body requires vitamins, minerals, and protein. Vegetables are the best source of vitamins and minerals. Vegetables also provide the perfect balance of protein. Processed food has little nutritional value, so try to avoid this. ° °Medications ° °Resume taking all of your medications unless your doctor tells you not to. If your incision is causing pain, you may take over-the-counter pain relievers such as acetaminophen (Tylenol) ° °Follow Up ° °Follow up will be arranged at the time of your procedure. You may have an office visit scheduled or may be scheduled for surgery. Ask your surgeon if you have any questions. ° °Please call us immediately for any of the following conditions: °•Severe or worsening pain your legs or feet at rest or with walking. °•Increased pain, redness, drainage at your groin puncture site. °•Fever of 101 degrees or higher. °•If you have any mild or slow bleeding from your puncture site: lie down, apply firm constant pressure over the area with a piece of  gauze or a clean wash cloth for 30 minutes- no peeking!, call 911 right away if you are still bleeding after 30 minutes, or if the bleeding is heavy and unmanageable. ° °Reduce your risk factors of vascular disease: ° °Stop smoking. If you would like help call [facility] at 1-800-QUIT-NOW (1-800-784-8669) or [place] at 336-586-4000. °Manage your cholesterol °Maintain a desired weight °Control your diabetes °Keep your blood pressure down ° °If you have any questions, please call the office at [phone] ° °

## 2018-11-11 NOTE — H&P (Signed)
[facility] History & Physical Update  The patient was interviewed and re-examined.  The patient's previous History and Physical has been reviewed and is unchanged.  There is no change in the plan of care. We plan to proceed with the scheduled procedure.  Leotis Pain, MD  11/11/2018, 8:04 AM

## 2018-11-12 ENCOUNTER — Ambulatory Visit: Payer: Medicare Other | Admitting: Internal Medicine

## 2018-11-12 ENCOUNTER — Encounter: Payer: Self-pay | Admitting: Vascular Surgery

## 2018-11-14 NOTE — Progress Notes (Signed)
DAWNN, NAM ([id number]) Visit Report for 11/08/2018 Arrival Information Details Patient Name: [name], [name] Date of Service: 11/08/2018 9:00 AM Medical Record Number: [id number] Patient Account Number: [id number] Date of Birth/Sex: 10-Feb-1923 (84 y.o. F) Treating RN: Harold Barban Primary Care Klay Sobotka: Grayland Ormond Other Clinician: Referring Teofilo Lupinacci: Grayland Ormond Treating [name] Wandel/Extender: Melburn Hake, HOYT Weeks in Treatment: 16 Visit Information History Since Last Visit Added or deleted any medications: No Patient Arrived: Ambulatory Any new allergies or adverse reactions: No Arrival Time: 09:15 Had a fall or experienced change in No Accompanied By: daughter activities of daily living that may affect Transfer Assistance: None risk of falls: Patient Identification Verified: Yes Signs or symptoms of abuse/neglect since last visito No Secondary Verification Process Yes Hospitalized since last visit: No Completed: Has Dressing in Place as Prescribed: Yes Patient Requires Transmission-Based No Pain Present Now: No Precautions: Patient Has Alerts: Yes Patient Alerts: ABI AVVS 03/29/18 L 1.05 R 1.03, TBI L .56, R .85 ABI AVVS 07/23/18 L .95 R 1.26 Electronic Signature(s) Signed: 11/12/2018 5:05:35 PM By: Harold Barban Entered By: Harold Barban on 11/08/2018 09:16:02 Candler, [name] ([id number]) -------------------------------------------------------------------------------- Encounter Discharge Information Details Patient Name: [name] Date of Service: 11/08/2018 9:00 AM Medical Record Number: [id number] Patient Account Number: [id number] Date of Birth/Sex: [birth year] (84 y.o. F) Treating RN: Montey Hora Primary Care Jessicia Napolitano: Grayland Ormond Other Clinician: Referring Shawnell Dykes: Grayland Ormond Treating Alliene Klugh/Extender: Melburn Hake, HOYT Weeks in Treatment: 69 Encounter Discharge Information Items Post Procedure Vitals Discharge  Condition: Stable Temperature (F): 98.1 Ambulatory Status: Ambulatory Pulse (bpm): 75 Discharge Destination: Home Respiratory Rate (breaths/min): 16 Transportation: Private Auto Blood Pressure (mmHg): 141/57 Accompanied By: daughter Schedule Follow-up Appointment: Yes Clinical Summary of Care: Electronic Signature(s) Signed: 11/08/2018 3:45:44 PM By: Montey Hora Entered By: Montey Hora on 11/08/2018 10:11:23 [name] ([id number]) -------------------------------------------------------------------------------- Lower Extremity Assessment Details Patient Name: [name] Date of Service: 11/08/2018 9:00 AM Medical Record Number: [id number] Patient Account Number: [id number] Date of Birth/Sex: [birth year] (84 y.o. F) Treating RN: Harold Barban Primary Care Grayton Lobo: Grayland Ormond Other Clinician: Referring Lamaj Metoyer: Grayland Ormond Treating Octavia Mottola/Extender: Melburn Hake, HOYT Weeks in Treatment: 65 Electronic Signature(s) Signed: 11/12/2018 5:05:35 PM By: Harold Barban Entered By: Harold Barban on 11/08/2018 09:29:43 Waller, [name] ([id number]) -------------------------------------------------------------------------------- Multi Wound Chart Details Patient Name: [name] Date of Service: 11/08/2018 9:00 AM Medical Record Number: [id number] Patient Account Number: [id number] Date of Birth/Sex: July 05, 1923 (84 y.o. F) Treating RN: Montey Hora Primary Care Aysen Shieh: Grayland Ormond Other Clinician: Referring Raffaella Edison: Grayland Ormond Treating Maite Burlison/Extender: STONE III, HOYT Weeks in Treatment: 65 Vital Signs Height(in): 65 Pulse(bpm): 75 Weight(lbs): 154.3 Blood Pressure(mmHg): 141/57 Body Mass Index(BMI): 26 Temperature(F): 98.1 Respiratory Rate 16 (breaths/min): Photos: [1:No Photos] [2:No Photos] [3:No Photos] Wound Location: [1:Right Malleolus - Lateral] [2:Left Malleolus - Lateral] [3:Left Calcaneus] Wounding Event:  [1:Gradually Appeared] [2:Gradually Appeared] [3:Gradually Appeared] Primary Etiology: [1:Pressure Ulcer] [2:Pressure Ulcer] [3:Pressure Ulcer] Secondary Etiology: [1:Arterial Insufficiency Ulcer] [2:Arterial Insufficiency Ulcer] [3:N/A] Comorbid History: [1:Cataracts, Congestive Heart Failure, Hypertension, Osteoarthritis, Neuropathy] [2:Cataracts, Congestive Heart Failure, Hypertension, Osteoarthritis, Neuropathy] [3:Cataracts, Congestive Heart Failure, Hypertension, Osteoarthritis,  Neuropathy] Date Acquired: [1:08/11/2015] [2:04/12/2018] [3:10/04/2018] Weeks of Treatment: [1:65] [2:29] [3:4] Wound Status: [1:Open] [2:Open] [3:Open] Measurements L x W x D [1:0.1x0.1x0.1] [2:1.2x1x0.5] [3:2x2.5x0.1] (cm) Area (cm) : [1:0.008] [2:0.942] [3:3.927] Volume (cm) : [1:0.001] [2:0.471] [3:0.393] % Reduction in Area: [1:99.50%] [2:-20.00%] [3:-42.90%] % Reduction in Volume: [1:99.80%] [2:-496.20%] [3:28.50%] Classification: [1:Category/Stage IV] [2:Category/Stage IV] [3:Category/Stage II] Exudate Amount: [1:Small] [2:Medium] [3:Large] Exudate Type: [  1:Serous] [2:Serous] [3:Serous] Exudate Color: [1:amber] [2:amber] [3:amber] Wound Margin: [1:Distinct, outline attached] [2:Epibole] [3:Indistinct, nonvisible] Granulation Amount: [1:None Present (0%)] [2:Small (1-33%)] [3:None Present (0%)] Granulation Quality: [1:N/A] [2:Pink, Pale] [3:N/A] Necrotic Amount: [1:Medium (34-66%)] [2:Large (67-100%)] [3:Small (1-33%)] Necrotic Tissue: [1:Adherent Slough] [2:Adherent Slough] [3:Eschar, Adherent Slough] Exposed Structures: [1:Fat Layer (Subcutaneous Tissue) Exposed: Yes Fascia: No Tendon: No Muscle: No Joint: No Bone: No] [2:Fat Layer (Subcutaneous Tissue) Exposed: Yes Fascia: No Tendon: No Muscle: No Joint: No Bone: No] [3:Fascia: No Fat Layer (Subcutaneous Tissue)  Exposed: No Tendon: No Muscle: No Joint: No Bone: No Limited to Skin Breakdown] Epithelialization: [1:Medium (34-66%)] [2:None]  [3:None] Periwound Skin Texture: [1:Excoriation: No Induration: No] [2:Excoriation: No Induration: No] [3:Excoriation: No Induration: No] Callus: No Callus: No Callus: No Crepitus: No Crepitus: No Crepitus: No Rash: No Rash: No Rash: No Scarring: No Scarring: No Scarring: No Periwound Skin Moisture: Maceration: No Maceration: Yes Maceration: Yes Dry/Scaly: No Dry/Scaly: No Dry/Scaly: No Periwound Skin Color: Rubor: Yes Atrophie Blanche: No Atrophie Blanche: No Atrophie Blanche: No Cyanosis: No Cyanosis: No Cyanosis: No Ecchymosis: No Ecchymosis: No Ecchymosis: No Erythema: No Erythema: No Erythema: No Hemosiderin Staining: No Hemosiderin Staining: No Hemosiderin Staining: No Mottled: No Mottled: No Mottled: No Pallor: No Pallor: No Pallor: No Rubor: No Rubor: No Temperature: No Abnormality No Abnormality No Abnormality Tenderness on Palpation: Yes Yes Yes Wound Preparation: Ulcer Cleansing: Ulcer Cleansing: Ulcer Cleansing: Rinsed/Irrigated with Saline Rinsed/Irrigated with Saline Rinsed/Irrigated with Saline Topical Anesthetic Applied: Topical Anesthetic Applied: Topical Anesthetic Applied: Other: lidocaine 4% Other: lidocaine 4% Other: lidocaine 4% Treatment Notes Electronic Signature(s) Signed: 11/08/2018 3:45:44 PM By: Montey Hora Entered By: Montey Hora on 11/08/2018 09:50:12 Privett, [name] ([id number]) -------------------------------------------------------------------------------- [place] Details Patient Name: [name] Date of Service: 11/08/2018 9:00 AM Medical Record Number: [id number] Patient Account Number: [id number] Date of Birth/Sex: 01-10-1923 (84 y.o. F) Treating RN: Montey Hora Primary Care Mallory Schaad: Grayland Ormond Other Clinician: Referring Drayden Lukas: Grayland Ormond Treating Jackqulyn Mendel/Extender: Melburn Hake, HOYT Weeks in Treatment: 48 Active Inactive Abuse / Safety / Falls / Self Care  Management Nursing Diagnoses: Potential for falls Goals: Patient will not experience any injury related to falls Date Initiated: 08/10/2017 Target Resolution Date: 11/10/2017 Goal Status: Active Interventions: Assess Activities of Daily Living upon admission and as needed Assess fall risk on admission and as needed Assess: immobility, friction, shearing, incontinence upon admission and as needed Notes: Nutrition Nursing Diagnoses: Imbalanced nutrition Potential for alteratiion in Nutrition/Potential for imbalanced nutrition Goals: Patient/caregiver agrees to and verbalizes understanding of need to use nutritional supplements and/or vitamins as prescribed Date Initiated: 08/10/2017 Target Resolution Date: 12/08/2017 Goal Status: Active Interventions: Assess patient nutrition upon admission and as needed per policy Notes: Orientation to the Wound Care Program Nursing Diagnoses: Knowledge deficit related to the wound healing center program Goals: Patient/caregiver will verbalize understanding of the [facility] Program Date Initiated: 08/10/2017 Target Resolution Date: 09/08/2017 Goal Status: Active [name], [name] ([id number]) Interventions: Provide education on orientation to the wound center Notes: Pain, Acute or Chronic Nursing Diagnoses: Pain, acute or chronic: actual or potential Potential alteration in comfort, pain Goals: Patient/caregiver will verbalize adequate pain control between visits Date Initiated: 08/10/2017 Target Resolution Date: 12/08/2017 Goal Status: Active Interventions: Complete pain assessment as per visit requirements Notes: Wound/Skin Impairment Nursing Diagnoses: Impaired tissue integrity Knowledge deficit related to ulceration/compromised skin integrity Goals: Ulcer/skin breakdown will have a volume reduction of 80% by week 12 Date Initiated: 08/10/2017 Target Resolution Date: 12/01/2017 Goal Status: Active  Interventions: Assess  patient/caregiver ability to perform ulcer/skin care regimen upon admission and as needed Notes: Electronic Signature(s) Signed: 11/08/2018 3:45:44 PM By: Montey Hora Entered By: Montey Hora on 11/08/2018 09:50:04 Trachtenberg, [name] ([id number]) -------------------------------------------------------------------------------- Pain Assessment Details Patient Name: [name] Date of Service: 11/08/2018 9:00 AM Medical Record Number: [id number] Patient Account Number: [id number] Date of Birth/Sex: 04/18/1923 (84 y.o. F) Treating RN: Harold Barban Primary Care Mattilyn Crites: Grayland Ormond Other Clinician: Referring Everlene Cunning: Grayland Ormond Treating Karynn Deblasi/Extender: Melburn Hake, HOYT Weeks in Treatment: 83 Active Problems Location of Pain Severity and Description of Pain Patient Has Paino No Site Locations Pain Management and Medication Current Pain Management: Electronic Signature(s) Signed: 11/12/2018 5:05:35 PM By: Harold Barban Entered By: Harold Barban on 11/08/2018 09:16:10 Maxton, [name] ([id number]) -------------------------------------------------------------------------------- Patient/Caregiver Education Details Patient Name: [name] Date of Service: 11/08/2018 9:00 AM Medical Record Number: [id number] Patient Account Number: [id number] Date of Birth/Gender: [birth year] (84 y.o. F) Treating RN: Montey Hora Primary Care Physician: Grayland Ormond Other Clinician: Referring Physician: Grayland Ormond Treating Physician/Extender: Sharalyn Ink in Treatment: 60 Education Assessment Education Provided To: Patient Education Topics Provided Wound/Skin Impairment: Handouts: Other: wound care as ordered Methods: Demonstration, Explain/Verbal Responses: State content correctly Electronic Signature(s) Signed: 11/08/2018 3:45:44 PM By: Montey Hora Entered By: Montey Hora on 11/08/2018 10:03:16 Beaudoin, [name]  ([id number]) -------------------------------------------------------------------------------- Wound Assessment Details Patient Name: [name] Date of Service: 11/08/2018 9:00 AM Medical Record Number: [id number] Patient Account Number: [id number] Date of Birth/Sex: 1923-03-03 (84 y.o. 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 Etiology: Date Acquired: 08/11/2015 Wound Status: Open Weeks Of Treatment: 65 Comorbid Cataracts, Congestive Heart Failure, Clustered Wound: No History: Hypertension, Osteoarthritis, Neuropathy Photos Photo Uploaded By: Gretta Cool, BSN, RN, CWS, Kim on 11/08/2018 17:10:15 Wound Measurements Length: (cm) 0.1 Width: (cm) 0.1 Depth: (cm) 0.1 Area: (cm) 0.008 Volume: (cm) 0.001 % Reduction in Area: 99.5% % Reduction in Volume: 99.8% Epithelialization: Medium (34-66%) Tunneling: No Undermining: No Wound Description Classification: Category/Stage IV Foul Odor Wound Margin: Distinct, outline attached Slough/Fi Exudate Amount: Small Exudate Type: Serous Exudate Color: amber After Cleansing: No brino Yes Wound Bed Granulation Amount: None Present (0%) Exposed Structure Necrotic Amount: Medium (34-66%) Fascia Exposed: No Necrotic Quality: Adherent Slough Fat Layer (Subcutaneous Tissue) Exposed: Yes Tendon Exposed: No Muscle Exposed: No Joint Exposed: No Bone Exposed: No Periwound Skin Texture Lemelle, [name] ([id number]) Texture Color No Abnormalities Noted: No No Abnormalities Noted: No Callus: No Atrophie Blanche: No Crepitus: No Cyanosis: No Excoriation: No Ecchymosis: No Induration: No Erythema: No Rash:  No Hemosiderin Staining: No Scarring: No Mottled: No Pallor: No Moisture Rubor: Yes No Abnormalities Noted: No Dry / Scaly: No Temperature / Pain Maceration: No Temperature: No Abnormality Tenderness on Palpation: Yes Wound Preparation Ulcer Cleansing: Rinsed/Irrigated with Saline Topical Anesthetic Applied: Other: lidocaine 4%, Treatment Notes Wound #1 (Right, Lateral Malleolus) Notes right - prisma and bordered foam dressing, left - silvercel and abd and conform secured with netting Electronic Signature(s) Signed: 11/12/2018 5:05:35 PM By: Harold Barban Entered By: Harold Barban on 11/08/2018 09:29:05 Deguia, [name] ([id number]) -------------------------------------------------------------------------------- Wound Assessment Details Patient Name: [name] Date of Service: 11/08/2018 9:00 AM Medical Record Number: [id number] Patient Account Number: [id number] Date of Birth/Sex: [birth year] (84 y.o. F)  Treating RN: Harold Barban Primary Care Marshal Eskew: Grayland Ormond Other Clinician: Referring Jimmylee Ratterree: Grayland Ormond Treating Zakkiyya Barno/Extender: STONE III, HOYT Weeks in Treatment: 65 Wound Status Wound Number: 2 Primary Pressure Ulcer Etiology: Wound Location: Left Malleolus - Lateral Secondary Arterial Insufficiency Ulcer Wounding Event: Gradually Appeared Etiology: Date Acquired: 04/12/2018 Wound Status: Open Weeks Of Treatment: 29 Comorbid Cataracts, Congestive Heart Failure, Clustered Wound: No History: Hypertension, Osteoarthritis, Neuropathy Photos Photo Uploaded By: Gretta Cool, BSN, RN, CWS, Kim on 11/08/2018 17:10:16 Wound Measurements Length: (cm) 1.2 % Reducti Width: (cm) 1 % Reducti Depth: (cm) 0.5 Epithelia Area: (cm) 0.942 Tunnelin Volume: (cm) 0.471 Undermin on in Area: -20% on in Volume: -496.2% lization: None g: No ing: No Wound Description Classification: Category/Stage IV Foul Odor Wound Margin: Epibole  Slough/Fi Exudate Amount: Medium Exudate Type: Serous Exudate Color: amber After Cleansing: No brino Yes Wound Bed Granulation Amount: Small (1-33%) Exposed Structure Granulation Quality: Pink, Pale Fascia Exposed: No Necrotic Amount: Large (67-100%) Fat Layer (Subcutaneous Tissue) Exposed: Yes Necrotic Quality: Adherent Slough Tendon Exposed: No Muscle Exposed: No Joint Exposed: No Bone Exposed: No Periwound Skin Texture Dibari, [name] ([id number]) Texture Color No Abnormalities Noted: No No Abnormalities Noted: No Callus: No Atrophie Blanche: No Crepitus: No Cyanosis: No Excoriation: No Ecchymosis: No Induration: No Erythema: No Rash: No Hemosiderin Staining: No Scarring: No Mottled: No Pallor: No Moisture Rubor: No No Abnormalities Noted: No Dry / Scaly: No Temperature / Pain Maceration: Yes Temperature: No Abnormality Tenderness on Palpation: Yes Wound Preparation Ulcer Cleansing: Rinsed/Irrigated with Saline Topical Anesthetic Applied: Other: lidocaine 4%, Treatment Notes Wound #2 (Left, Lateral Malleolus) Notes right - prisma and bordered foam dressing, left - silvercel and abd and conform secured with netting Electronic Signature(s) Signed: 11/12/2018 5:05:35 PM By: Harold Barban Entered By: Harold Barban on 11/08/2018 09:29:19 Hedman, [name] ([id number]) -------------------------------------------------------------------------------- Wound Assessment Details Patient Name: [name] Date of Service: 11/08/2018 9:00 AM Medical Record Number: [id number] Patient Account Number: [id number] Date of Birth/Sex: 19-Oct-1923 (84 y.o. F) Treating RN: Harold Barban Primary Care Artisha Capri: Grayland Ormond Other Clinician: Referring Towanda Hornstein: Grayland Ormond Treating Danta Baumgardner/Extender: STONE III, HOYT Weeks in Treatment: 65 Wound Status Wound Number: 3 Primary Pressure Ulcer Etiology: Wound Location: Left Calcaneus Wound Open Wounding  Event: Gradually Appeared Status: Date Acquired: 10/04/2018 Comorbid Cataracts, Congestive Heart Failure, Weeks Of Treatment: 4 History: Hypertension, Osteoarthritis, Neuropathy Clustered Wound: No Photos Photo Uploaded By: Gretta Cool, BSN, RN, CWS, Kim on 11/08/2018 17:12:07 Wound Measurements Length: (cm) 2 Width: (cm) 2.5 Depth: (cm) 0.1 Area: (cm) 3.927 Volume: (cm) 0.393 % Reduction in Area: -42.9% % Reduction in Volume: 28.5% Epithelialization: None Tunneling: No Undermining: No Wound Description Classification: Category/Stage II Foul Odor Wound Margin: Indistinct, nonvisible Slough/Fi Exudate Amount: Large Exudate Type: Serous Exudate Color: amber After Cleansing: No brino No Wound Bed Granulation Amount: None Present (0%) Exposed Structure Necrotic Amount: Small (1-33%) Fascia Exposed: No Necrotic Quality: Eschar, Adherent Slough Fat Layer (Subcutaneous Tissue) Exposed: No Tendon Exposed: No Muscle Exposed: No Joint Exposed: No Bone Exposed: No Limited to Skin Breakdown Periwound Skin Texture Gipe, [name] ([id number]) Texture Color No Abnormalities Noted: No No Abnormalities Noted: No Callus: No Atrophie Blanche: No Crepitus: No Cyanosis: No Excoriation: No Ecchymosis: No Induration: No Erythema: No Rash: No Hemosiderin Staining: No Scarring: No Mottled: No Pallor: No Moisture Rubor: No No Abnormalities Noted: No Dry / Scaly: No Temperature / Pain Maceration: Yes Temperature: No Abnormality Tenderness on Palpation: Yes Wound Preparation Ulcer Cleansing: Rinsed/Irrigated with Saline Topical Anesthetic Applied: Other:  lidocaine 4%, Treatment Notes Wound #3 (Left Calcaneus) Notes right - prisma and bordered foam dressing, left - silvercel and abd and conform secured with netting Electronic Signature(s) Signed: 11/12/2018 5:05:35 PM By: Harold Barban Entered By: Harold Barban on 11/08/2018 09:29:36 Minch, [name]  ([id number]) -------------------------------------------------------------------------------- [place] Details Patient Name: [name] Date of Service: 11/08/2018 9:00 AM Medical Record Number: [id number] Patient Account Number: [id number] Date of Birth/Sex: July 18, 1923 (84 y.o. F) Treating RN: Harold Barban Primary Care Markavious Micco: Grayland Ormond Other Clinician: Referring Jalen Oberry: Grayland Ormond Treating Genevra Orne/Extender: STONE III, HOYT Weeks in Treatment: 65 Vital Signs Time Taken: 09:16 Temperature (F): 98.1 Height (in): 65 Pulse (bpm): 75 Weight (lbs): 154.3 Respiratory Rate (breaths/min): 16 Body Mass Index (BMI): 25.7 Blood Pressure (mmHg): 141/57 Reference Range: 80 - 120 mg / dl Electronic Signature(s) Signed: 11/12/2018 5:05:35 PM By: Harold Barban Entered By: Harold Barban on 11/08/2018 09:17:28

## 2018-11-15 ENCOUNTER — Encounter: Payer: Medicare Other | Admitting: Physician Assistant

## 2018-11-15 DIAGNOSIS — L89514 Pressure ulcer of right ankle, stage 4: Secondary | ICD-10-CM | POA: Diagnosis not present

## 2018-11-17 NOTE — Progress Notes (Signed)
TYANA, BUTZER ([id number]) Visit Report for 11/15/2018 Chief Complaint Document Details Patient Name: [name], [name]. Date of Service: 11/15/2018 10:00 AM Medical Record Number: [id number] Patient Account Number: [id number] Date of Birth/Sex: [birth year] (84 y.o. F) Treating RN: Montey Hora Primary Care Provider: Grayland Ormond Other Clinician: Referring Provider: Grayland Ormond Treating Provider/Extender: Melburn Hake, Elexa Kivi Weeks in Treatment: 86 Information Obtained from: Patient Chief Complaint Patient is here for right lateral malleolus and left lateral malleolus ulcer Electronic Signature(s) Signed: 11/15/2018 8:01:46 PM By: Worthy Keeler PA-C Entered By: Worthy Keeler on 11/15/2018 10:05:48 [name], [name] ([id number]) -------------------------------------------------------------------------------- Debridement Details Patient Name: [name] Date of Service: 11/15/2018 10:00 AM Medical Record Number: [id number] Patient Account Number: [id number] Date of Birth/Sex: 05/11/1923 (84 y.o. F) Treating RN: Montey Hora Primary Care Provider: Grayland Ormond Other Clinician: Referring Provider: Grayland Ormond Treating Provider/Extender: Melburn Hake, Jerin Franzel Weeks in Treatment: 66 Debridement Performed for Wound #3 Left Calcaneus Assessment: Performed By: Physician STONE III, Rubylee Zamarripa E., PA-C Debridement Type: Debridement Level of Consciousness (Pre- Awake and Alert procedure): Pre-procedure Verification/Time Yes - 10:28 Out Taken: Start Time: 10:28 Pain Control: Lidocaine 4% Topical Solution Total Area Debrided (L x W): 2.9 (cm) x 2.7 (cm) = 7.83 (cm) Tissue and other material Viable, Non-Viable, Slough, Subcutaneous, Slough debrided: Level: Skin/Subcutaneous Tissue Debridement Description: Excisional Instrument: Curette Bleeding: Minimum Hemostasis Achieved: Pressure End Time: 10:30 Procedural Pain: 0 Post Procedural Pain: 0 Response to  Treatment: Procedure was tolerated well Level of Consciousness Awake and Alert (Post-procedure): Post Debridement Measurements of Total Wound Length: (cm) 2.9 Stage: Category/Stage II Width: (cm) 2.7 Depth: (cm) 0.2 Volume: (cm) 1.23 Character of Wound/Ulcer Post Improved Debridement: Post Procedure Diagnosis Same as Pre-procedure Electronic Signature(s) Signed: 11/15/2018 5:23:14 PM By: Montey Hora Signed: 11/15/2018 8:01:46 PM By: Worthy Keeler PA-C Entered By: Montey Hora on 11/15/2018 10:31:22 [name], [name] ([id number]) -------------------------------------------------------------------------------- HPI Details Patient Name: [name] Date of Service: 11/15/2018 10:00 AM Medical Record Number: [id number] Patient Account Number: [id number] Date of Birth/Sex: [birth year] (84 y.o. 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 Dr. Amalia Hailey the podiatrist saw her on 07/20/2017 and sent her to the wound center for possible hyperbaric oxygen therapy. past medical history of peripheral vascular disease, varicose veins, status post appendectomy, basal cell carcinoma excision from the left leg, cholecystectomy, pacemaker placement, right lower extremity angiography done by Dr. dew in March 2017 with placement of a stent. there is also note of a successful ablation of the right small saphenous vein done which was reviewed by ultrasound on 10/24/2016. the patient had a right small saphenous vein ablation done on 10/20/2016. The patient has never been a smoker. She has been seen by Dr.  Corene Cornea dew the vascular surgeon who most recently saw her on 06/15/2017 for evaluation of ongoing problems with right leg swelling. She had a lower extremity arterial duplex examination done(02/13/17) which showed patent distal right superficial femoral artery stent and above-the-knee popliteal stent without evidence of restenosis. The ABI was more than 1.3 on the right and more than 1.3 on the left. This was consistent with noncompressible arteries due to medial calcification. The right great toe pressure and PPG waveforms  are within normal limits and the left great toe pressure and PPG waveforms are decreased. he recommended she continue to wear her compression stockings and continue with elevation. She is scheduled to have a noninvasive arterial study in the near future 08/16/2017 -- had a lower extremity arterial duplex examination done which showed patent distal right superficial femoral artery stent and above-the-knee popliteal stent without evidence of restenosis. The ABI was more than 1.3 on the right and more than 1.3 on the left. This was consistent with noncompressible arteries due to medial calcification. The right great toe pressure and PPG waveforms are within normal limits and the left great toe pressure and PPG waveforms are decreased. the x-ray of the right ankle has not yet been done 08/24/2017 -- had a right ankle x-ray -- IMPRESSION:1. No fracture, bone lesion or evidence of osteomyelitis. 2. Lateral soft tissue swelling with a soft tissue ulcer. she has not yet seen the vascular surgeon for review 08/31/17 on evaluation today patient's wound appears to be showing signs of improvement. She still with her appointment with vascular in order to review her results of her vascular study and then determine if any intervention would be recommended at that time. No fevers, chills, nausea, or vomiting noted at this time. She has been tolerating the dressing changes  without complication. 09/28/17 on evaluation today patient's wound appears to show signs of good improvement in regard to the granulation tissue which is surfacing. There is still a layer of slough covering the wound and the posterior portion is still significantly deeper than the anterior nonetheless there has been some good sign of things moving towards the better. She is going to go back to Dr. dew for reevaluation to ensure her blood flow is still appropriate. That will be before her next evaluation with [place] next week. No fevers, chills, nausea, or vomiting noted at this time. Patient does have some discomfort rated to be a 3-4/10 depending on activity specifically cleansing the wound makes it worse. 10/05/2017 -- the patient was seen by Dr. Lucky Cowboy last week and noninvasive studies showed a normal right ABI with brisk triphasic waveforms consistent with no arterial insufficiency including normal digital pressures. The duplex showed a patent distal right SFA stent and the proximal SFA was also normal. He was pleased with her test and thought she should have enough of perfusion for normal wound healing. He would see her back in 6 months time. 12/21/17 on evaluation today patient appears to be doing fairly well in regard to her right lateral ankle wound. Unfortunately the main issue that she is expansion at this point is that she is having some issues with what appears to be some cellulitis in the [name], [name]. ([id number]) right anterior shin. She has also been noting a little bit of uncomfortable feeling especially last night and her ankle area. I'm afraid that she made the developing a little bit of an infection. With that being said I think it is in the early stages. 12/28/17 on evaluation today patient's ankle appears to be doing excellent. She's making good progress at this point the cellulitis seems to have improved after last week's evaluation. Overall she is having no significant  discomfort which is excellent news. She does have an appointment with Dr. dew on March 29, 2018 for reevaluation in regard to the stent he placed. She seems to have excellent blood flow in the right lower extremity. 01/19/12 on evaluation today patient's wound appears to be doing very well. In  fact she does not appear to require debridement at this point, there's no evidence of infection, and overall from the standpoint of the wound she seems to be doing very well. With that being said I believe that it may be time to switch to different dressing away from the [facility] Dressing she tells me she does have a lot going on her friend actually passed away yesterday and she's also having a lot of issues with her husband this obviously is weighing heavy on her as far as your thoughts and concerns today. 01/25/18 on evaluation today patient appears to be doing fairly well in regard to her right lateral malleolus. She has been tolerating the dressing changes without complication. Overall I feel like this is definitely showing signs of improvement as far as how the overall appearance of the wound is there's also evidence of epithelium start to migrate over the granulation tissue. In general I think that she is progressing nicely as far as the wound is concerned. The only concern she really has is whether or not we can switch to every other week visits in order to avoid having as many appointments as her daughters have a difficult time getting her to her appointments as well as the patient's husband to his he is not doing very well at this point. 02/22/18 on evaluation today patient's right lateral malleolus ulcer appears to be doing great. She has been tolerating the dressing changes without complication. Overall you making excellent progress at this time. Patient is having no significant discomfort. 03/15/18 on evaluation today patient appears to be doing much more poorly in regard to her right lateral  ankle ulcer at this point. Unfortunately since have last seen her her husband has passed just a few days ago is obviously weighed heavily on her her daughter also had surgery well she is with her today as usual. There does not appear to be any evidence of infection she does seem to have significant contusion/deep tissue injury to the right lateral malleolus which was not noted previous when I saw her last. It's hard to tell of exactly when this injury occurred although during the time she was spending the night in the hospital this may have been most likely. 03/22/18 on evaluation today patient appears to actually be doing very well in regard to her ulcer. She did unfortunately have a setback which was noted last week however the good news is we seem to be getting back on track and in fact the wound in the core did still have some necrotic tissue which will be addressed at this point today but in general I'm seeing signs that things are on the up and up. She is glad to hear this obviously she's been somewhat concerned that due to the how her wound digressed more recently. 03/29/18 on evaluation today patient appears to be doing fairly well in regard to her right lower extremity lateral malleolus ulcer. She unfortunately does have a new area of pressure injury over the inferior portion where the wound has opened up a little bit larger secondary to the pressure she seems to be getting. She does tell me sometimes when she sleeps at night that it actually hurts and does seem to be pushing on the area little bit more unfortunately. There does not appear to be any evidence of infection which is good news. She has been tolerating the dressing changes without complication. She also did have some bruising in the left second and third toes due  to the fact that she may have bump this or injured it although she has neuropathy so she does not feel she did move recently that may have been where this came from.  Nonetheless there does not appear to be any evidence of infection at this time. 04/12/18 on evaluation today patient's wound on the right lateral ankle actually appears to be doing a little bit better with a lot of necrotic docking tissue centrally loosening up in clearing away. However she does have the beginnings of a deep tissue injury on the left lateral malleolus likely due to the fact we've been trying offload the right as much as we have. I think she may benefit from an assistive soft device to help with offloading and it looks like they're looking at one of the doughnut conditions that wraps around the lower leg to offload which I think will definitely do a good job. With that being said I think we definitely need to address this issue on the left before it becomes a wound. Patient is not having significant pain. 04/19/18 on evaluation today patient appears to be doing excellent in regard to the progress she's made with her right lateral ankle ulcer. The left ankle region which did show evidence of a deep tissue injury seems to be resolving there's little fluid noted underneath and a blister there's nothing open at this point in time overall I feel like this is progressing nicely which is good news. She does not seem to be having significant discomfort at this point which is also good news. 04/25/18-She is here in follow up evaluation for bilateral lateral malleolar ulcers. The right lateral malleolus ulcer with pale subcutaneous tissue exposure, central area of ulcer with tendon/periosteum exposed. The left lateral malleolus ulcer now with Pestka, [name] ([id number]) central area of nonviable tissue, otherwise deep tissue injury. She is wearing compression wraps to the left lower extremity, she will place the right lower extremity compression wraps on when she gets home. She will be out of town over the weekend and return next week and follow-up appointment. She completed her doxycycline  this morning 05/03/18 on evaluation today patient appears to be doing very well in regard to her right lateral ankle ulcer in general. At least she's showing some signs of improvement in this regard. Unfortunately she has some additional injury to the left lateral malleolus region which appears to be new likely even over the past several days. Again this determination is based on the overall appearance. With that being said the patient is obviously frustrated about this currently. 05/10/18-She is here in follow-up evaluation for bilateral lateral malleolar ulcers. She states she has purchased offloading shoes/boots and they will arrive tomorrow. She was asked to bring them in the office at next week's appointment so her provider is aware of product being utilized. She continues to sleep on right or left side, she has been encouraged to sleep on her back. The right lateral malleolus ulcer is precariously close to peri-osteum; will order xray. The left lateral malleolus ulcer is improved. Will switch back to santyl; she will follow up next week. 05/17/18 on evaluation today patient actually appears to be doing very well in regard to her malleolus her ulcers compared to last time I saw them. She does not seem to have as much in the way of contusion at this point which is great news. With that being said she does continue to have discomfort and I do believe that she is still continuing  to benefit from the offloading/pressure reducing boots that were recommended. I think this is the key to trying to get this to heal up completely. 05/24/18 on evaluation today patient actually appears to be doing worse at this point in time unfortunately compared to her last week's evaluation. She is having really no increased pain which is good news unfortunately she does have more maceration in your theme and noted surrounding the right lateral ankle the left lateral ankle is not really is erythematous I do not see signs of  the overt cellulitis on that side. Unfortunately the wounds do not seem to have shown any signs of improvement since the last evaluation. She also has significant swelling especially on the right compared to previous some of this may be due to infection however also think that she may be served better while she has these wounds by compression wrapping versus continuing to use the Juxta-Lite for the time being. Especially with the amount of drainage that she is experiencing at this point. No fevers, chills, nausea, or vomiting noted at this time. 05/31/18 on evaluation today patient appears to actually be doing better in regard to her right lateral lower extremity ulcer specifically on the malleolus region. She has been tolerating the antibiotic without complication. With that being said she still continues to have issues but a little bit of redness although nothing like she what she was experiencing previous. She still continues to pressure to her ankle area she did get the problem on offloading boots unfortunately she will not wear them she states there too uncomfortable and she can't get in and out of the bed. Nonetheless at this point her wounds seem to be continually getting worse which is not what we want I'm getting somewhat concerned about her progress and how things are going to proceed if we do not intervene in some way shape or form. I therefore had a very lengthy conversation today about offloading yet again and even made a specific suggestion for switching her to a memory foam mattress and even gave the information for a specific one that they could look at getting if it was something that they were interested in considering. She does not want to be considered for a hospital bed air mattress although honestly insurance would not cover it that she does not have any wounds on her trunk. 06/14/18 on evaluation today both wounds over the bilateral lateral malleolus her ulcers appear to be doing  better there's no evidence of pressure injury at this point. She did get the foam mattress for her bed and this does seem to have been extremely beneficial for her in my pinion. Her daughter states that she is having difficulty getting out of bed because of how soft it is. The patient also relates this to be. Nonetheless I do feel like she's actually doing better. Unfortunately right after and around the time she was getting the mattress she also sustained a fall when she got up to go pick up the phone and ended up injuring her right elbow she has 18 sutures in place. We are not caring for this currently although home health is going to be taking the sutures out shortly. Nonetheless this may be something that we need to evaluate going forward. It depends on how well it has or has not healed in the end. She also recently saw an orthopedic specialist for an injection in the right shoulder just before her fall unfortunately the fall seems to have worsened her  pain. 06/21/18 on evaluation today patient appears to be doing about the same in regard to her lateral malleolus ulcers. Both appear to be just a little bit deeper but again we are clinging away the necrotic and dead tissue which I think is why this is progressing towards a deeper realm as opposed to improving from my measurement standpoint in that regard. Nonetheless she has been tolerating the dressing changes she absolutely hates the memory foam mattress topper that was obtained for her nonetheless I do believe this is still doing excellent as far as taking care of excess pressure in regard to the lateral malleolus regions. She in fact has no pressure injury that I see whereas in weeks past it was week by week I was constantly seeing new pressure injuries. Overall I think it has been very beneficial for her. 07/03/18; patient arrives in my clinic today. She has deep punched out areas over her bilateral lateral malleoli. The area on the right has  some more depth. [name], [name] ([id number]) We spent a lot of time today talking about pressure relief for these areas. This started when her daughter asked for a prescription for a memory foam mattress. I have never written a prescription for a mattress and I don't think insurances would pay for that on an ordinary bed. In any case he came up that she has foam boots that she refuses to wear. I would suggest going to these before any other offloading issues when she is in bed. They say she is meticulous about offloading this the rest of the day 07/10/18- She is seen in follow-up evaluation for bilateral, lateral malleolus ulcers. There is no improvement in the ulcers. She has purchased and is sleeping on a memory foam mattress/overlay, she has been using the offloading boots nightly over the past week. She has a follow up appointment with vascular medicine at the end of October, in my opinion this follow up should be expedited given her deterioration and suboptimal TBI results. We will order plain film xray of the left ankle as deeper structures are palpable; would consider having MRI, regardless of xray report(s). The ulcers will be treated with iodoflex/iodosorb, she is unable to safely change the dressings daily with santyl. 07/19/18 on evaluation today patient appears to be doing in general visually well in regard to her bilateral lateral malleolus ulcers. She has been tolerating the dressing changes without complication which is good news. With that being said we did have an x-ray performed on 07/12/18 which revealed a slight loosen see in the lateral portion of the distal left fibula which may represent artifact but underline lytic destruction or osteomyelitis could not be excluded. MRI was recommended. With that being said we can see about getting the patient scheduled for an MRI to further evaluate this area. In fact we have that scheduled currently for August 20 19,019. 07/26/18 on  evaluation today patient's wound on the right lateral ankle actually appears to be doing fairly well at this point in my pinion. She has made some good progress currently. With that being said unfortunately in regard to the left lateral ankle ulcer this seems to be a little bit more problematic at this time. In fact as I further evaluated the situation she actually had bone exposed which is the first time that's been the case in the bone appear to be necrotic. Currently I did review patient's note from Dr. Bunnie Domino office with [facility]. He stated that ABI was  1.26 on the right and 0.95 on the left with good waveforms. Her perfusion is stable not reduced from previous studies and her digital waveforms were pretty good particularly on the right. His conclusion upon review of the note was that there was not much she could do to improve her perfusion and he felt she was adequate for wound healing. His suggestion was that she continued to see [place] and consider a synthetic skin graft if there was no underlying infection. He plans to see her back in six months or as needed. 08/01/18 on evaluation today patient appears to be doing better in regard to her right lateral ankle ulcer. Her left lateral ankle ulcer is about the same she still has bone involvement in evidence of necrosis. There does not appear to be evidence of infection at this time On the right lateral lower extremity. I have started her on the Augmentin she picked this up and started this yesterday. This is to get her through until she sees infectious disease which is scheduled for 08/12/18. 08/06/18 on evaluation today patient appears to be doing rather well considering my discussion with patient's daughter at the end of last week. The area which was marked where she had erythema seems to be improved and this is good news. With that being said overall the patient seems to be making good improvement when it comes to the overall  appearance of the right lateral ankle ulcer although this has been slow she at least is coming around in this regard. Unfortunately in regard to the left lateral ankle ulcer this is osteomyelitis based on the pathology report as well is bone culture. Nonetheless we are still waiting CT scan. Unfortunately the MRI we originally ordered cannot be performed as the patient is a pacemaker which I had overlooked. Nonetheless we are working on the CT scan approval and scheduling as of now. She did go to the hospital over the weekend and was placed on IV Cefzo for a couple of days. Fortunately this seems to have improved the erythema quite significantly which is good news. There does not appear to be any evidence of worsening infection at this time. She did have some bleeding after the last debridement therefore I did not perform any sharp debridement in regard to left lateral ankle at this point. Patient has been approved for a snap vac for the right lateral ankle. 08/14/18; the patient with wounds over her bilateral lateral malleoli. The area on the right actually looks quite good. Been using a snap back on this area. Healthy granulation and appears to be filling in. Unfortunately the area on the left is really problematic. 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 Also noted to have cellulitis. She saw Dr. Novella Olive of infectious disease today and was put on linezolid. We are able to verify this with her pharmacy. She is completed the Augmentin that she was already on. We've been using Iodoflex to this area 08/23/18 on evaluation today patient's wounds both actually appear to be doing better compared to my prior evaluations. Fortunately she showing signs of good improvement in regard to the overall wound status especially where were using the snap vac on the right. In regard to left lateral malleolus the wound bed actually  appears to be much cleaner than previously noted. I do not feel any phone directly probed during evaluation today and though there is tendon noted this does not  appear [name], [name] ([id number]) to be necrotic it's actually fairly good as far as the overall appearance of the tendon is concerned. In general the wound bed actually appears to be doing significantly better than it was previous. Patient is currently in the care of [name] and I did review that note today. He actually has her on two weeks of linezolid and then following the patient will be on 1-2 months of Keflex. That is the plan currently. She has been on antibiotics therapy as prescribed by myself initially starting on July 30, 2018 and has been on that continuously up to this point. 08/30/18 on evaluation today patient actually appears to be doing much better in regard to her right lateral malleolus ulcer. She has been tolerating the dressing changes specifically the snap vac without complication although she did have some issues with the seal currently. Apparently there was some trouble with getting it to maintain over the past week past Sunday. Nonetheless overall the wound appears better in regard to the right lateral malleolus region. In regard to left lateral malleolus this actually show some signs of additional granulation although there still tendon noted in the base of the wound this appears to be healthy not necrotic in any way whatsoever. We are considering potentially using a snap vac for the left lateral malleolus as well the product wrap from [employer], [place], was present in the clinic today we're going to see this patient I did have her come in with me after obtaining consent from the patient and her daughter in order to look at the wound and see if there's any recommendation one way or another as to whether or not they felt the snapback could be beneficial for the left lateral malleolus region. But the conclusion  was that it might be but that this is definitely a little bit deeper wound than what traditionally would be utilized for a snap vac. 09/06/18 on evaluation today patient actually appears to be doing excellent in my pinion in regard to both ankle ulcers. She has been tolerating the dressing changes without complication which is great news. Specifically we have been using the snap vac. In regard to the right ankle I'm not even sure that this is going to be necessary for today and following as the wound has filled in quite nicely. In regard to the left ankle I do believe that we're seeing excellent epithelialization from the edge as well as granulation in the central portion the tendon is still exposed but there's no evidence of necrotic bone and in general I feel like the patient has made excellent progress even compared to last week with just one week of the snap vac. 09/11/18; this is a patient who has wounds on her bilateral lateral malleoli. Initially both of these were deep stage IV wounds in the setting of chronic arterial insufficiency. She has been revascularized. As I understand think she been using snap vacs to both of these wounds however the area on the right became more superficial and currently she is only using it on the left. Using silver collagen on the right and silver collagen under the back on the left I believe 09/19/18 on evaluation today patient actually appears to be doing very well in regard to her lateral malleolus or ulcers bilaterally. She has been tolerating the dressing changes without complication. Fortunately there does not appear to be any evidence of infection at this time. Overall I feel like she is improving in an excellent manner  and I'm very pleased with the fact that everything seems to be turning towards the better for her. This has obviously been a long road. 09/27/18 on evaluation today patient actually appears to be doing very well in regard to her bilateral  lateral malleolus ulcers. She has been tolerating the dressing changes without complication. Fortunately there does not appear to be any evidence of infection at this time which is also great news. No fevers, chills, nausea, or vomiting noted at this time. Overall I feel like she is doing excellent with the snap vac on the left malleolus. She had 40 mL of fluid collection over the past week. 10/04/18 on evaluation today patient actually appears to be doing well in regard to her bilateral lateral malleolus ulcers. She continues to tolerate the dressing changes without complication. One issue that I see is the snap vac on the left lateral malleolus which appears to have sealed off some fluid underlying this area and has not really allowed it to heal to the degree that I would like to see. For that reason I did suggest at this point we may want to pack a small piece of packing strip into this region to allow it to more effectively wick out fluid. 10/11/18 in general the patient today does not feel that she has been doing very well. She's been a little bit lethargic and subsequently is having bodyaches as well according to what she tells me today. With that being said overall she has been concerned with the fact that something may be worsening although to be honest her wounds really have not been appearing poorly. She does have a new ulcer on her left heel unfortunately. This may be pressure related. Nonetheless it seems to me to have potentially started at least as a blister I do not see any evidence of deep tissue injury. In regard to the left ankle the snap vac still seems to be causing the ceiling off of the deeper part of the wound which is in turn trapping fluid. I'm not extremely pleased with the overall appearance as far as progress from last week to this week therefore I'm gonna discontinue the snap vac at this point. 10/18/18 patient unfortunately this point has not been feeling well for the  past several days. She was seen by Grayland Ormond her primary care provider who is a Librarian, academic at [facility]. Subsequently she states that she's been very weak and generally feeling malaise. No fevers, chills, nausea, or vomiting noted at this time. With that being said bloodwork was performed at the PCP office on the 11th of this month which showed a white blood cell count of 10.7. This was repeated today Hench, [name] ([id number]) and shows a white blood cell count of 12.4. This does show signs of worsening. Coupled with the fact that she is feeling worse and that her left ankle wound is not really showing signs of improvement I feel like this is an indication that the osteomyelitis is likely exacerbating not improving. Overall I think we may also want to check her C-reactive protein and sedimentation rate. Actually did call [facility] office this afternoon while the patient was in the office here with me. Subsequently based on the findings we discussed treatment possibilities and I think that it is appropriate for [place] to go ahead and initiate treatment with doxycycline which I'm going to do. Subsequently he did agree to see about adding a CRP and sedimentation rate to her orders. If that  has not already been drawn to where they can run it they will contact the patient she can come back to have that check. They are in agreement with plan as far as the patient and her daughter are concerned. Nonetheless also think we need to get in touch with Dr. Henreitta Leber office to see about getting the patient scheduled with him as soon as possible. 11/08/18 on evaluation today patient presents for follow-up concerning her bilateral foot and ankle ulcers. I did do an extensive review of her chart in epic today. Subsequently she was seen by [name] he did initiate Cefepime IV antibiotic therapy. Subsequently she had some issues with her PICC line this had to be removed because it was coiled  and then replaced. Fortunately that was now settled. Unfortunately she has continued have issues with her left heel as well as the issues that she is experiencing with her bilateral lateral malleolus regions. I do believe however both areas seem to be doing a little bit better on evaluation today which is good news. No fevers, chills, nausea, or vomiting noted at this time. She actually has an angiogram schedule with Dr. dew on this coming Monday, November 11, 2018. Subsequently the patient states that she is feeling much better especially than what she was roughly 2 weeks ago. She actually had to cancel an appointment because she was feeling so poorly. No fevers, chills, nausea, or vomiting noted at this time. 11/15/18 on evaluation today patient actually is status post having had her angiogram with Dr. dew Monday, four days ago. 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 He had to go and work on several areas of the vasculature fortunately he was able to obtain no more than a 30% residual stenosis throughout post procedure. I reviewed this note today. I think this will definitely help with healing at this time. Fortunately there does not appear to be any signs of infection and I do feel like ratio already has a better appearance to it. Electronic Signature(s) Signed: 11/15/2018 8:01:46 PM By: Worthy Keeler PA-C Entered By: Worthy Keeler on 11/15/2018 11:04:40 Soileau, [name] ([id number]) -------------------------------------------------------------------------------- Physical Exam Details Patient Name: [name] Date of Service: 11/15/2018 10:00 AM Medical Record Number: [id number] Patient Account Number: [id number] Date of Birth/Sex: December 10, 1922 (84 y.o. F) Treating RN: Montey Hora Primary Care Provider: Grayland Ormond Other Clinician: Referring Provider: Grayland Ormond Treating Provider/Extender: STONE III, Johnwilliam Shepperson Weeks in Treatment:  68 Constitutional Well-nourished and well-hydrated in no acute distress. Respiratory normal breathing without difficulty. clear to auscultation bilaterally. Cardiovascular regular rate and rhythm with normal S1, S2. 1+ dorsalis pedis/posterior tibialis pulses. Psychiatric this patient is able to make decisions and demonstrates good insight into disease process. Alert and Oriented x 3. pleasant and cooperative. Notes Patient's wound bed currently shows evidence of good granulation which is excellent news. She has been tolerating the dressing changes without complication. Fortunately there does not appear to be any evidence of infection at any site at this time. I did perform debridement of the left heel although the remaining two ulcers did not require sharp debridement today. She is still receiving IV antibiotic therapy is managed by infectious disease. Electronic Signature(s) Signed: 11/15/2018 8:01:46 PM By: Worthy Keeler PA-C Entered By: Worthy Keeler on 11/15/2018 10:57:52 Chaudhari, [name] ([id number]) -------------------------------------------------------------------------------- Physician Orders Details Patient Name: [name] Date of Service: 11/15/2018 10:00 AM Medical Record Number: [id number] Patient Account Number: [id number] Date  of Birth/Sex: 01-Sep-1923 (84 y.o. F) Treating RN: Montey Hora Primary Care Provider: Grayland Ormond Other Clinician: Referring Provider: Grayland Ormond Treating Provider/Extender: Melburn Hake, Kendahl Bumgardner Weeks in Treatment: 15 Verbal / Phone Orders: No Diagnosis Coding ICD-10 Coding Code Description 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, stage 4 L89.524 Pressure ulcer of left ankle, stage 4 S90.822A Blister (nonthermal), left foot, initial encounter S50.539 Pressure ulcer of left heel, stage 2 I70.233 Atherosclerosis of native arteries of right leg with ulceration of ankle I70.243 Atherosclerosis of native arteries of left leg  with ulceration of ankle I89.0 Lymphedema, not elsewhere classified B35.4 Tinea corporis 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 o Cleanse wound with mild soap and water o May Shower, gently pat wound dry prior to applying new dressing. Wound #2 Left,Lateral Malleolus o Clean wound with Normal Saline. o Cleanse wound with mild soap and water o May Shower, gently pat wound dry prior to applying new dressing. Wound #3 Left Calcaneus o Clean wound with Normal Saline. o Cleanse wound with mild soap and water o May Shower, gently pat wound dry prior to applying new dressing. Anesthetic (add to Medication List) Wound #1 Right,Lateral Malleolus o Topical Lidocaine 4% cream applied to wound bed prior to debridement (In Clinic Only). Wound #2 Left,Lateral Malleolus o Topical Lidocaine 4% cream applied to wound bed prior to debridement (In Clinic Only). Wound #3 Left Calcaneus o Topical Lidocaine 4% cream applied to wound bed prior to debridement (In Clinic Only). Primary Wound Dressing Wound #1 Right,Lateral Malleolus o Silver Collagen Kensi, Karr [name] ([id number]) Wound #2 Left,Lateral Malleolus o Silver Alginate Wound #3 Left Calcaneus o Iodoflex Secondary Dressing Wound #1 Right,Lateral Malleolus o Boardered Foam Dressing Wound #2 Left,Lateral Malleolus o ABD and Kerlix/Conform - secure with netting Wound #3 Left Calcaneus o ABD and Kerlix/Conform - secure with netting Dressing Change Frequency Wound #1 Right,Lateral Malleolus o Change Dressing Monday, Wednesday, Friday Wound #2 Left,Lateral Malleolus o Change Dressing Monday, Wednesday, Friday Wound #3 Left Calcaneus o Change Dressing Monday, Wednesday, Friday Follow-up Appointments Wound #1 Right,Lateral Malleolus o Return Appointment in 1 week. Wound #2 Left,Lateral  Malleolus o Return Appointment in 1 week. Wound #3 Left Calcaneus o Return Appointment in 1 week. Edema Control Wound #1 Right,Lateral Malleolus o Patient to wear own compression stockings o Patient to wear own Velcro compression garment. o Elevate legs to the level of the heart and pump ankles as often as possible Wound #2 Left,Lateral Malleolus o Patient to wear own compression stockings o Patient to wear own Velcro compression garment. o Elevate legs to the level of the heart and pump ankles as often as possible Off-Loading Wound #1 Right,Lateral Malleolus o Turn and reposition every 2 hours o Other: - do not put pressure on your ankles Wound #2 Left,Lateral Malleolus o Turn and reposition every 2 hours Strojny, [name] ([id number]) o Other: - do not put pressure on your ankles Additional Orders / Instructions Wound #1 Right,Lateral Malleolus o Increase protein intake. Wound #2 Left,Lateral Malleolus o Increase protein intake. Home Health Wound #1 [place] [facility] - [facility] Nurse may visit PRN to address patientos wound care needs. o FACE TO FACE ENCOUNTER: MEDICARE and MEDICAID PATIENTS: I certify that this patient is under my care and that I had a face-to-face encounter that meets the physician face-to-face encounter requirements with this patient on this date. The encounter with  the patient was in whole or in part for the following MEDICAL CONDITION: (primary reason for [place]) MEDICAL NECESSITY: I certify, that based on my findings, NURSING services are a medically necessary home health service. HOME BOUND STATUS: I certify that my clinical findings support that this patient is homebound (i.e., Due to illness or injury, pt requires aid of supportive devices such as crutches, cane, wheelchairs, walkers, the use of special transportation or the assistance of another person to  leave their place of residence. There is a normal inability to leave the home and doing so requires considerable and taxing effort. Other absences are for medical reasons / religious services and are infrequent or of short duration when for other reasons). o If current dressing causes regression in wound condition, may D/C ordered dressing product/s and apply Normal Saline Moist Dressing daily until next [place] / Other MD appointment. [place] of regression in wound condition at [phone]. o Please direct any NON-WOUND related issues/requests for orders to patient's Primary Care Physician Wound #2 [place] Visits - [place] Nurse may visit PRN to address patientos wound care needs. o FACE TO FACE ENCOUNTER: MEDICARE and MEDICAID PATIENTS: I certify that this patient is under my care and that I had a face-to-face encounter that meets the physician face-to-face encounter requirements with this patient on this date. The encounter with the patient was in whole or in part for the following MEDICAL CONDITION: (primary reason for [name]) MEDICAL NECESSITY: I certify, that based on my findings, NURSING services are a medically necessary home health service. HOME BOUND STATUS: I certify that my clinical findings support that this patient is homebound (i.e., Due to illness or injury, pt requires aid of supportive devices such as crutches, cane, wheelchairs, walkers, the use of special transportation or the assistance of another person to leave their place of residence. There is a normal inability to leave the home and doing so requires considerable and taxing effort. Other absences are for medical reasons / religious services and are infrequent or of short duration when for other reasons). o If current dressing causes regression in wound condition, may D/C ordered dressing product/s and apply Normal  Saline Moist Dressing daily until next [place] / Other MD appointment. [place] of regression in wound condition at [phone]. o Please direct any NON-WOUND related issues/requests for orders to patient's Primary Care Physician Wound #3 Left Calcaneus o [place] Visits - [place] Nurse may visit PRN to address patientos wound care needs. o FACE TO FACE ENCOUNTER: MEDICARE and MEDICAID PATIENTS: I certify that this patient is under my care and that I had a face-to-face encounter that meets the physician face-to-face encounter requirements with this patient on this date. The encounter with the patient was in whole or in part for the following MEDICAL CONDITION: (primary reason for [place]) MEDICAL NECESSITY: I certify, that based on my findings, NURSING services are a medically necessary home health service. HOME BOUND STATUS: I certify that my clinical findings support that this patient is homebound (i.e., Due to illness or injury, pt requires aid of supportive devices such as crutches, cane, wheelchairs, walkers, the use of special transportation or the assistance of another person to leave their place of residence. There is a normal inability to leave the home [name], [name]. ([id number]) and doing so requires considerable and taxing effort. Other absences are for medical  reasons / religious services and are infrequent or of short duration when for other reasons). o If current dressing causes regression in wound condition, may D/C ordered dressing product/s and apply Normal Saline Moist Dressing daily until next [place] / Other MD appointment. [place] of regression in wound condition at [phone]. o Please direct any NON-WOUND related issues/requests for orders to patient's Primary Care Physician Electronic Signature(s) Signed: 11/15/2018 5:23:14 PM By: Montey Hora Signed: 11/15/2018 8:01:46 PM By: Worthy Keeler PA-C Entered By: Montey Hora on 11/15/2018 10:44:16 Voeltz, [name] ([id number]) -------------------------------------------------------------------------------- Problem List Details Patient Name: [name] Date of Service: 11/15/2018 10:00 AM Medical Record Number: [id number] Patient Account Number: [id number] Date of Birth/Sex: 11/25/1923 (84 y.o. F) Treating RN: Montey Hora Primary Care Provider: Grayland Ormond Other Clinician: Referring Provider: Grayland Ormond Treating Provider/Extender: Melburn Hake, Minda Faas Weeks in Treatment: 17 Active Problems ICD-10 Evaluated Encounter Code Description Active Date Today Diagnosis 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, stage 4 05/10/2018 No Yes L89.524 Pressure ulcer of left ankle, stage 4 04/25/2018 No Yes S90.822A Blister (nonthermal), left foot, initial encounter 10/11/2018 No Yes L89.622 Pressure ulcer of left heel, stage 2 10/11/2018 No Yes I70.233 Atherosclerosis of native arteries of right leg with ulceration of 08/10/2017 No Yes ankle I70.243 Atherosclerosis of native arteries of left leg with ulceration of 07/10/2018 No Yes ankle I89.0 Lymphedema, not elsewhere classified 08/10/2017 No Yes B35.4 Tinea corporis 09/28/2017 No Yes 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 F) Treating RN: Montey Hora Primary Care Provider:  Grayland Ormond Other Clinician: Referring Provider: Grayland Ormond Treating Provider/Extender: Melburn Hake, Kariah Loredo Weeks in Treatment: 49 Subjective Chief Complaint Information obtained from Patient Patient is here for right lateral malleolus and left lateral malleolus ulcer History of Present Illness (HPI) 84 year old patient who most recently has been seeing both podiatry and vascular surgery for a long-standing ulcer of her right lateral malleolus which has been treated with various methodologies. Dr. Amalia Hailey the podiatrist saw her on 07/20/2017 and sent her to the wound center for possible hyperbaric oxygen therapy. past medical history of peripheral vascular disease, varicose veins, status post appendectomy, basal cell carcinoma excision from the left leg, cholecystectomy, pacemaker placement, right lower extremity angiography done by Dr. dew in March 2017 with placement of a stent. there is also note of a successful ablation of the right small saphenous vein done which was reviewed by ultrasound on 10/24/2016. the patient had a right small saphenous vein ablation done on 10/20/2016. The patient has never been a smoker. She has been seen by Dr. Corene Cornea dew the vascular surgeon who most recently saw her on 06/15/2017 for evaluation of ongoing problems with right leg swelling. She had a lower extremity arterial duplex examination done(02/13/17) which showed patent distal right superficial femoral artery stent and above-the-knee popliteal stent without evidence of restenosis. The ABI was more than 1.3 on the right and more than 1.3 on the left. This was consistent with noncompressible arteries due to medial calcification. The right great toe pressure and PPG waveforms are within normal limits and the left great toe pressure and  PPG waveforms are decreased. he recommended she continue to wear her compression stockings and continue with elevation. She is scheduled to have a noninvasive arterial  study in the near future 08/16/2017 -- had a lower extremity arterial duplex examination done which showed patent distal right superficial femoral artery stent and above-the-knee popliteal stent without evidence of restenosis. The ABI was more than 1.3 on the right and more than 1.3 on the left. This was consistent with noncompressible arteries due to medial calcification. The right great toe pressure and PPG waveforms are within normal limits and the left great toe pressure and PPG waveforms are decreased. the x-ray of the right ankle has not yet been done 08/24/2017 -- had a right ankle x-ray -- IMPRESSION:1. No fracture, bone lesion or evidence of osteomyelitis. 2. Lateral soft tissue swelling with a soft tissue ulcer. she has not yet seen the vascular surgeon for review 08/31/17 on evaluation today patient's wound appears to be showing signs of improvement. She still with her appointment with vascular in order to review her results of her vascular study and then determine if any intervention would be recommended at that time. No fevers, chills, nausea, or vomiting noted at this time. She has been tolerating the dressing changes without complication. 09/28/17 on evaluation today patient's wound appears to show signs of good improvement in regard to the granulation tissue which is surfacing. There is still a layer of slough covering the wound and the posterior portion is still significantly deeper than the anterior nonetheless there has been some good sign of things moving towards the better. She is going to go back to Dr. dew for reevaluation to ensure her blood flow is still appropriate. That will be before her next evaluation with [place] next week. No fevers, chills, nausea, or vomiting noted at this time. Patient does have some discomfort rated to be a 3-4/10 depending on activity specifically cleansing the wound makes it worse. 10/05/2017 -- the patient was seen by Dr. Lucky Cowboy last week and  noninvasive studies showed a normal right ABI with brisk [name], [name]. ([id number]) triphasic waveforms consistent with no arterial insufficiency including normal digital pressures. The duplex showed a patent distal right SFA stent and the proximal SFA was also normal. He was pleased with her test and thought she should have enough of perfusion for normal wound healing. He would see her back in 6 months time. 12/21/17 on evaluation today patient appears to be doing fairly well in regard to her right lateral ankle wound. Unfortunately the main issue that she is expansion at this point is that she is having some issues with what appears to be some cellulitis in the right anterior shin. She has also been noting a little bit of uncomfortable feeling especially last night and her ankle area. I'm afraid that she made the developing a little bit of an infection. With that being said I think it is in the early stages. 12/28/17 on evaluation today patient's ankle appears to be doing excellent. She's making good progress at this point the cellulitis seems to have improved after last week's evaluation. Overall she is having no significant discomfort which is excellent news. She does have an appointment with Dr. dew on March 29, 2018 for reevaluation in regard to the stent he placed. She seems to have excellent blood flow in the right lower extremity. 01/19/12 on evaluation today patient's wound appears to be doing very well. In fact she does not appear to require debridement at this point,  there's no evidence of infection, and overall from the standpoint of the wound she seems to be doing very well. With that being said I believe that it may be time to switch to different dressing away from the [facility] Dressing she tells me she does have a lot going on her friend actually passed away yesterday and she's also having a lot of issues with her husband this obviously is weighing heavy on her as far as your  thoughts and concerns today. 01/25/18 on evaluation today patient appears to be doing fairly well in regard to her right lateral malleolus. She has been tolerating the dressing changes without complication. Overall I feel like this is definitely showing signs of improvement as far as how the overall appearance of the wound is there's also evidence of epithelium start to migrate over the granulation tissue. In general I think that she is progressing nicely as far as the wound is concerned. The only concern she really has is whether or not we can switch to every other week visits in order to avoid having as many appointments as her daughters have a difficult time getting her to her appointments as well as the patient's husband to his he is not doing very well at this point. 02/22/18 on evaluation today patient's right lateral malleolus ulcer appears to be doing great. She has been tolerating the dressing changes without complication. Overall you making excellent progress at this time. Patient is having no significant discomfort. 03/15/18 on evaluation today patient appears to be doing much more poorly in regard to her right lateral ankle ulcer at this point. Unfortunately since have last seen her her husband has passed just a few days ago is obviously weighed heavily on her her daughter also had surgery well she is with her today as usual. There does not appear to be any evidence of infection she does seem to have significant contusion/deep tissue injury to the right lateral malleolus which was not noted previous when I saw her last. It's hard to tell of exactly when this injury occurred although during the time she was spending the night in the hospital this may have been most likely. 03/22/18 on evaluation today patient appears to actually be doing very well in regard to her ulcer. She did unfortunately have a setback which was noted last week however the good news is we seem to be getting back on track  and in fact the wound in the core did still have some necrotic tissue which will be addressed at this point today but in general I'm seeing signs that things are on the up and up. She is glad to hear this obviously she's been somewhat concerned that due to the how her wound digressed more recently. 03/29/18 on evaluation today patient appears to be doing fairly well in regard to her right lower extremity lateral malleolus ulcer. She unfortunately does have a new area of pressure injury over the inferior portion where the wound has opened up a little bit larger secondary to the pressure she seems to be getting. She does tell me sometimes when she sleeps at night that it actually hurts and does seem to be pushing on the area little bit more unfortunately. There does not appear to be any evidence of infection which is good news. She has been tolerating the dressing changes without complication. She also did have some bruising in the left second and third toes due to the fact that she may have bump this or  injured it although she has neuropathy so she does not feel she did move recently that may have been where this came from. Nonetheless there does not appear to be any evidence of infection at this time. 04/12/18 on evaluation today patient's wound on the right lateral ankle actually appears to be doing a little bit better with a lot of necrotic docking tissue centrally loosening up in clearing away. However she does have the beginnings of a deep tissue injury on the left lateral malleolus likely due to the fact we've been trying offload the right as much as we have. I think she may benefit from an assistive soft device to help with offloading and it looks like they're looking at one of the doughnut conditions that wraps around the lower leg to offload which I think will definitely do a good job. With that being said I think we definitely need to address this issue on the left before it becomes a wound.  Patient is not having significant pain. [name], [name] ([id number]) 04/19/18 on evaluation today patient appears to be doing excellent in regard to the progress she's made with her right lateral ankle ulcer. The left ankle region which did show evidence of a deep tissue injury seems to be resolving there's little fluid noted underneath and a blister there's nothing open at this point in time overall I feel like this is progressing nicely which is good news. She does not seem to be having significant discomfort at this point which is also good news. 04/25/18-She is here in follow up evaluation for bilateral lateral malleolar ulcers. The right lateral malleolus ulcer with pale subcutaneous tissue exposure, central area of ulcer with tendon/periosteum exposed. The left lateral malleolus ulcer now with central area of nonviable tissue, otherwise deep tissue injury. She is wearing compression wraps to the left lower extremity, she will place the right lower extremity compression wraps on when she gets home. She will be out of town over the weekend and return next week and follow-up appointment. She completed her doxycycline this morning 05/03/18 on evaluation today patient appears to be doing very well in regard to her right lateral ankle ulcer in general. At least she's showing some signs of improvement in this regard. Unfortunately she has some additional injury to the left lateral malleolus region which appears to be new likely even over the past several days. Again this determination is based on the overall appearance. With that being said the patient is obviously frustrated about this currently. 05/10/18-She is here in follow-up evaluation for bilateral lateral malleolar ulcers. She states she has purchased offloading shoes/boots and they will arrive tomorrow. She was asked to bring them in the office at next week's appointment so her provider is aware of product being utilized. She continues to  sleep on right or left side, she has been encouraged to sleep on her back. The right lateral malleolus ulcer is precariously close to peri-osteum; will order xray. The left lateral malleolus ulcer is improved. Will switch back to santyl; she will follow up next week. 05/17/18 on evaluation today patient actually appears to be doing very well in regard to her malleolus her ulcers compared to last time I saw them. She does not seem to have as much in the way of contusion at this point which is great news. With that being said she does continue to have discomfort and I do believe that she is still continuing to benefit from the offloading/pressure reducing boots that were recommended.  I think this is the key to trying to get this to heal up completely. 05/24/18 on evaluation today patient actually appears to be doing worse at this point in time unfortunately compared to her last week's evaluation. She is having really no increased pain which is good news unfortunately she does have more maceration in your theme and noted surrounding the right lateral ankle the left lateral ankle is not really is erythematous I do not see signs of the overt cellulitis on that side. Unfortunately the wounds do not seem to have shown any signs of improvement since the last evaluation. She also has significant swelling especially on the right compared to previous some of this may be due to infection however also think that she may be served better while she has these wounds by compression wrapping versus continuing to use the Juxta-Lite for the time being. Especially with the amount of drainage that she is experiencing at this point. No fevers, chills, nausea, or vomiting noted at this time. 05/31/18 on evaluation today patient appears to actually be doing better in regard to her right lateral lower extremity ulcer specifically on the malleolus region. She has been tolerating the antibiotic without complication. With that  being said she still continues to have issues but a little bit of redness although nothing like she what she was experiencing previous. She still continues to pressure to her ankle area she did get the problem on offloading boots unfortunately she will not wear them she states there too uncomfortable and she can't get in and out of the bed. Nonetheless at this point her wounds seem to be continually getting worse which is not what we want I'm getting somewhat concerned about her progress and how things are going to proceed if we do not intervene in some way shape or form. I therefore had a very lengthy conversation today about offloading yet again and even made a specific suggestion for switching her to a memory foam mattress and even gave the information for a specific one that they could look at getting if it was something that they were interested in considering. She does not want to be considered for a hospital bed air mattress although honestly insurance would not cover it that she does not have any wounds on her trunk. 06/14/18 on evaluation today both wounds over the bilateral lateral malleolus her ulcers appear to be doing better there's no evidence of pressure injury at this point. She did get the foam mattress for her bed and this does seem to have been extremely beneficial for her in my pinion. Her daughter states that she is having difficulty getting out of bed because of how soft it is. The patient also relates this to be. Nonetheless I do feel like she's actually doing better. Unfortunately right after and around the time she was getting the mattress she also sustained a fall when she got up to go pick up the phone and ended up injuring her right elbow she has 18 sutures in place. We are not caring for this currently although home health is going to be taking the sutures out shortly. Nonetheless this may be something that we need to evaluate going forward. It depends on how well it has  or has not healed in the end. She also recently saw an orthopedic specialist for an injection in the right shoulder just before her fall unfortunately the fall seems to have worsened her pain. 06/21/18 on evaluation today patient appears to be doing  about the same in regard to her lateral malleolus ulcers. Both appear to be just a little bit deeper but again we are clinging away the necrotic and dead tissue which I think is why this is progressing towards a deeper realm as opposed to improving from my measurement standpoint in that regard. Nonetheless she has been tolerating the dressing changes she absolutely hates the memory foam mattress topper that was obtained for her nonetheless [name], [name] ([id number]) I do believe this is still doing excellent as far as taking care of excess pressure in regard to the lateral malleolus regions. She in fact has no pressure injury that I see whereas in weeks past it was week by week I was constantly seeing new pressure injuries. Overall I think it has been very beneficial for her. 07/03/18; patient arrives in my clinic today. She has deep punched out areas over her bilateral lateral malleoli. The area on the right has some more depth. We spent a lot of time today talking about pressure relief for these areas. This started when her daughter asked for a prescription for a memory foam mattress. I have never written a prescription for a mattress and I don't think insurances would pay for that on an ordinary bed. In any case he came up that she has foam boots that she refuses to wear. I would suggest going to these before any other offloading issues when she is in bed. They say she is meticulous about offloading this the rest of the day 07/10/18- She is seen in follow-up evaluation for bilateral, lateral malleolus ulcers. There is no improvement in the ulcers. She has purchased and is sleeping on a memory foam mattress/overlay, she has been using the offloading  boots nightly over the past week. She has a follow up appointment with vascular medicine at the end of October, in my opinion this follow up should be expedited given her deterioration and suboptimal TBI results. We will order plain film xray of the left ankle as deeper structures are palpable; would consider having MRI, regardless of xray report(s). The ulcers will be treated with iodoflex/iodosorb, she is unable to safely change the dressings daily with santyl. 07/19/18 on evaluation today patient appears to be doing in general visually well in regard to her bilateral lateral malleolus ulcers. She has been tolerating the dressing changes without complication which is good news. With that being said we did have an x-ray performed on 07/12/18 which revealed a slight loosen see in the lateral portion of the distal left fibula which may represent artifact but underline lytic destruction or osteomyelitis could not be excluded. MRI was recommended. With that being said we can see about getting the patient scheduled for an MRI to further evaluate this area. In fact we have that scheduled currently for August 20 19,019. 07/26/18 on evaluation today patient's wound on the right lateral ankle actually appears to be doing fairly well at this point in my pinion. She has made some good progress currently. With that being said unfortunately in regard to the left lateral ankle ulcer this seems to be a little bit more problematic at this time. In fact as I further evaluated the situation she actually had bone exposed which is the first time that's been the case in the bone appear to be necrotic. Currently I did review patient's note from Dr. Bunnie Domino office with [facility]. He stated that ABI was 1.26 on the right and 0.95 on the left with good  waveforms. Her perfusion is stable not reduced from previous studies and her digital waveforms were pretty good particularly on the right. His conclusion  upon review of the note was that there was not much she could do to improve her perfusion and he felt she was adequate for wound healing. His suggestion was that she continued to see [place] and consider a synthetic skin graft if there was no underlying infection. He plans to see her back in six months or as needed. 08/01/18 on evaluation today patient appears to be doing better in regard to her right lateral ankle ulcer. Her left lateral ankle ulcer is about the same she still has bone involvement in evidence of necrosis. There does not appear to be evidence of infection at this time On the right lateral lower extremity. I have started her on the Augmentin she picked this up and started this yesterday. This is to get her through until she sees infectious disease which is scheduled for 08/12/18. 08/06/18 on evaluation today patient appears to be doing rather well considering my discussion with patient's daughter at the end of last week. The area which was marked where she had erythema seems to be improved and this is good news. With that being said overall the patient seems to be making good improvement when it comes to the overall appearance of the right lateral ankle ulcer although this has been slow she at least is coming around in this regard. Unfortunately in regard to the left lateral ankle ulcer this is osteomyelitis based on the pathology report as well is bone culture. Nonetheless we are still waiting CT scan. Unfortunately the MRI we originally ordered cannot be performed as the patient is a pacemaker which I had overlooked. Nonetheless we are working on the CT scan approval and scheduling as of now. She did go to the hospital over the weekend and was placed on IV Cefzo for a couple of days. Fortunately this seems to have improved the erythema quite significantly which is good news. There does not appear to be any evidence of worsening infection at this time. She did have some bleeding after the  last debridement therefore I did not perform any sharp debridement in regard to left lateral ankle at this point. Patient has been approved for a snap vac for the right lateral ankle. 08/14/18; the patient with wounds over her bilateral lateral malleoli. The area on the right actually looks quite good. Been using a snap back on this area. Healthy granulation and appears to be filling in. Unfortunately the area on the left is really problematic. 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 Also noted to have cellulitis. She saw Dr. Novella Olive of infectious disease today and was put on linezolid. We are able to verify this with her pharmacy. She is completed the Augmentin that she was already on. We've been using Iodoflex to this [name], [name] ([id number]) area 08/23/18 on evaluation today patient's wounds both actually appear to be doing better compared to my prior evaluations. Fortunately she showing signs of good improvement in regard to the overall wound status especially where were using the snap vac on the right. In regard to left lateral malleolus the wound bed actually appears to be much cleaner than previously noted. I do not feel any phone directly probed during evaluation today and though there is tendon noted this does not appear to be necrotic it's actually fairly  good as far as the overall appearance of the tendon is concerned. In general the wound bed actually appears to be doing significantly better than it was previous. Patient is currently in the care of [name] and I did review that note today. He actually has her on two weeks of linezolid and then following the patient will be on 1-2 months of Keflex. That is the plan currently. She has been on antibiotics therapy as prescribed by myself initially starting on July 30, 2018 and has been on that continuously up to this point. 08/30/18 on evaluation  today patient actually appears to be doing much better in regard to her right lateral malleolus ulcer. She has been tolerating the dressing changes specifically the snap vac without complication although she did have some issues with the seal currently. Apparently there was some trouble with getting it to maintain over the past week past Sunday. Nonetheless overall the wound appears better in regard to the right lateral malleolus region. In regard to left lateral malleolus this actually show some signs of additional granulation although there still tendon noted in the base of the wound this appears to be healthy not necrotic in any way whatsoever. We are considering potentially using a snap vac for the left lateral malleolus as well the product wrap from [employer], [place], was present in the clinic today we're going to see this patient I did have her come in with me after obtaining consent from the patient and her daughter in order to look at the wound and see if there's any recommendation one way or another as to whether or not they felt the snapback could be beneficial for the left lateral malleolus region. But the conclusion was that it might be but that this is definitely a little bit deeper wound than what traditionally would be utilized for a snap vac. 09/06/18 on evaluation today patient actually appears to be doing excellent in my pinion in regard to both ankle ulcers. She has been tolerating the dressing changes without complication which is great news. Specifically we have been using the snap vac. In regard to the right ankle I'm not even sure that this is going to be necessary for today and following as the wound has filled in quite nicely. In regard to the left ankle I do believe that we're seeing excellent epithelialization from the edge as well as granulation in the central portion the tendon is still exposed but there's no evidence of necrotic bone and in general I feel like the patient has  made excellent progress even compared to last week with just one week of the snap vac. 09/11/18; this is a patient who has wounds on her bilateral lateral malleoli. Initially both of these were deep stage IV wounds in the setting of chronic arterial insufficiency. She has been revascularized. As I understand think she been using snap vacs to both of these wounds however the area on the right became more superficial and currently she is only using it on the left. Using silver collagen on the right and silver collagen under the back on the left I believe 09/19/18 on evaluation today patient actually appears to be doing very well in regard to her lateral malleolus or ulcers bilaterally. She has been tolerating the dressing changes without complication. Fortunately there does not appear to be any evidence of infection at this time. Overall I feel like she is improving in an excellent manner and I'm very pleased with the fact that everything seems  to be turning towards the better for her. This has obviously been a long road. 09/27/18 on evaluation today patient actually appears to be doing very well in regard to her bilateral lateral malleolus ulcers. She has been tolerating the dressing changes without complication. Fortunately there does not appear to be any evidence of infection at this time which is also great news. No fevers, chills, nausea, or vomiting noted at this time. Overall I feel like she is doing excellent with the snap vac on the left malleolus. She had 40 mL of fluid collection over the past week. 10/04/18 on evaluation today patient actually appears to be doing well in regard to her bilateral lateral malleolus ulcers. She continues to tolerate the dressing changes without complication. One issue that I see is the snap vac on the left lateral malleolus which appears to have sealed off some fluid underlying this area and has not really allowed it to heal to the degree that I would like to  see. For that reason I did suggest at this point we may want to pack a small piece of packing strip into this region to allow it to more effectively wick out fluid. 10/11/18 in general the patient today does not feel that she has been doing very well. She's been a little bit lethargic and subsequently is having bodyaches as well according to what she tells me today. With that being said overall she has been concerned with the fact that something may be worsening although to be honest her wounds really have not been appearing poorly. She does have a new ulcer on her left heel unfortunately. This may be pressure related. Nonetheless it seems to me to have potentially started at least as a blister I do not see any evidence of deep tissue injury. In regard to the left ankle the snap vac still seems to be causing the ceiling off of the deeper part of the wound which is in turn trapping fluid. I'm not extremely pleased with the overall appearance as far as progress from last week to this week therefore I'm gonna discontinue [place], [name] ([id number]) the snap vac at this point. 10/18/18 patient unfortunately this point has not been feeling well for the past several days. She was seen by Grayland Ormond her primary care provider who is a Librarian, academic at [facility]. Subsequently she states that she's been very weak and generally feeling malaise. No fevers, chills, nausea, or vomiting noted at this time. With that being said bloodwork was performed at the PCP office on the 11th of this month which showed a white blood cell count of 10.7. This was repeated today and shows a white blood cell count of 12.4. This does show signs of worsening. Coupled with the fact that she is feeling worse and that her left ankle wound is not really showing signs of improvement I feel like this is an indication that the osteomyelitis is likely exacerbating not improving. Overall I think we may also want to check  her C-reactive protein and sedimentation rate. Actually did call [facility] office this afternoon while the patient was in the office here with me. Subsequently based on the findings we discussed treatment possibilities and I think that it is appropriate for [place] to go ahead and initiate treatment with doxycycline which I'm going to do. Subsequently he did agree to see about adding a CRP and sedimentation rate to her orders. If that has not already been drawn to where they can run  it they will contact the patient she can come back to have that check. They are in agreement with plan as far as the patient and her daughter are concerned. Nonetheless also think we need to get in touch with Dr. Henreitta Leber office to see about getting the patient scheduled with him as soon as possible. 11/08/18 on evaluation today patient presents for follow-up concerning her bilateral foot and ankle ulcers. I did do an extensive review of her chart in epic today. Subsequently she was seen by [name] he did initiate Cefepime IV antibiotic therapy. Subsequently she had some issues with her PICC line this had to be removed because it was coiled and then replaced. Fortunately that was now settled. Unfortunately she has continued have issues with her left heel as well as the issues that she is experiencing with her bilateral lateral malleolus regions. I do believe however both areas seem to be doing a little bit better on evaluation today which is good news. No fevers, chills, nausea, or vomiting noted at this time. She actually has an angiogram schedule with Dr. dew on this coming Monday, November 11, 2018. Subsequently the patient states that she is feeling much better especially than what she was roughly 2 weeks ago. She actually had to cancel an appointment because she was feeling so poorly. No fevers, chills, nausea, or vomiting noted at this time. 11/15/18 on evaluation today patient actually is status post having had  her angiogram with Dr. dew Monday, four days ago. 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 He had to go and work on several areas of the vasculature fortunately he was able to obtain no more than a 30% residual stenosis throughout post procedure. I reviewed this note today. I think this will definitely help with healing at this time. Fortunately there does not appear to be any signs of infection and I do feel like ratio already has a better appearance to it. Patient History Information obtained from Patient. Family History Cancer - Father,Siblings, Heart Disease - Siblings, No family history of Diabetes, Hereditary Spherocytosis, Hypertension, Kidney Disease, Lung Disease, Seizures, Stroke, Thyroid Problems, Tuberculosis. Social History Never smoker, Marital Status - Married, Alcohol Use - Never, Drug Use - No History, Caffeine Use - Rarely. Review of Systems (ROS) Constitutional Symptoms (General Health) Denies complaints or symptoms of Fever, Chills. Respiratory The patient has no complaints or symptoms. Cardiovascular The patient has no complaints or symptoms. Psychiatric The patient has no complaints or symptoms. [name], [name] ([id number]) Objective Constitutional Well-nourished and well-hydrated in no acute distress. Vitals Time Taken: 9:56 AM, Height: 65 in, Weight: 154.3 lbs, BMI: 25.7, Temperature: 98.3 F, Pulse: 75 bpm, Respiratory Rate: 16 breaths/min, Blood Pressure: 123/55 mmHg. Respiratory normal breathing without difficulty. clear to auscultation bilaterally. Cardiovascular regular rate and rhythm with normal S1, S2. 1+ dorsalis pedis/posterior tibialis pulses. Psychiatric this patient is able to make decisions and demonstrates good insight into disease process. Alert and Oriented x 3. pleasant and cooperative. General Notes: Patient's wound bed currently shows evidence of good granulation which is excellent news. She has  been tolerating the dressing changes without complication. Fortunately there does not appear to be any evidence of infection at any site at this time. I did perform debridement of the left heel although the remaining two ulcers did not require sharp debridement today. She is still receiving IV antibiotic therapy is managed by infectious disease. Integumentary (Hair, Skin) Wound #1 status is Open. Original  cause of wound was Gradually Appeared. The wound is located on the Right,Lateral Malleolus. The wound measures 0.2cm length x 0.2cm width x 0.1cm depth; 0.031cm^2 area and 0.003cm^3 volume. There is Fat Layer (Subcutaneous Tissue) Exposed exposed. There is no tunneling or undermining noted. There is a small amount of serous drainage noted. The wound margin is distinct with the outline attached to the wound base. There is no granulation within the wound bed. There is a medium (34-66%) amount of necrotic tissue within the wound bed including Adherent Slough. The periwound skin appearance exhibited: Rubor. The periwound skin appearance did not exhibit: Callus, Crepitus, Excoriation, Induration, Rash, Scarring, Dry/Scaly, Maceration, Atrophie Blanche, Cyanosis, Ecchymosis, Hemosiderin Staining, Mottled, Pallor, Erythema. Periwound temperature was noted as No Abnormality. The periwound has tenderness on palpation. Wound #2 status is Open. Original cause of wound was Gradually Appeared. The wound is located on the Left,Lateral Malleolus. The wound measures 0.9cm length x 0.9cm width x 0.7cm depth; 0.636cm^2 area and 0.445cm^3 volume. There is Fat Layer (Subcutaneous Tissue) Exposed exposed. There is no tunneling or undermining noted. There is a medium amount of serous drainage noted. The wound margin is epibole. There is small (1-33%) pink, pale granulation within the wound bed. There is a large (67-100%) amount of necrotic tissue within the wound bed including Adherent Slough. The periwound skin  appearance exhibited: Maceration. The periwound skin appearance did not exhibit: Callus, Crepitus, Excoriation, Induration, Rash, Scarring, Dry/Scaly, Atrophie Blanche, Cyanosis, Ecchymosis, Hemosiderin Staining, Mottled, Pallor, Rubor, Erythema. Periwound temperature was noted as No Abnormality. The periwound has tenderness on palpation. Wound #3 status is Open. Original cause of wound was Gradually Appeared. The wound is located on the Left Calcaneus. The wound measures 2.9cm length x 2.7cm width x 0.1cm depth; 6.15cm^2 area and 0.615cm^3 volume. There is Fat Layer (Subcutaneous Tissue) Exposed exposed. There is no tunneling or undermining noted. There is a large amount of serous drainage noted. The wound margin is indistinct and nonvisible. There is no granulation within the wound bed. There is a small (1-33%) amount of necrotic tissue within the wound bed including Eschar and Adherent Slough. The periwound skin appearance exhibited: Maceration. The periwound skin appearance did not exhibit: Callus, Crepitus, Excoriation, Induration, Rash, Scarring, Dry/Scaly, Atrophie Blanche, Cyanosis, Ecchymosis, Hemosiderin Staining, Mottled, Pallor, Rubor, Erythema. Periwound temperature was noted as No Abnormality. The periwound has tenderness on palpation. 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 There was a Excisional Skin/Subcutaneous Tissue Debridement  with a total area of 7.83 sq cm performed by STONE III, Alva Broxson E., PA-C. With the following instrument(s): Curette to remove Viable and Non-Viable tissue/material. Material removed includes Subcutaneous Tissue and Slough and after achieving pain control using Lidocaine 4% Topical Solution. No specimens were taken. A time out was conducted at 10:28, prior to the start of the procedure. A Minimum amount of bleeding was controlled with Pressure. The procedure was tolerated well with a pain level of 0 throughout and a pain level of 0 following the procedure. Post Debridement Measurements: 2.9cm length x 2.7cm  width x 0.2cm depth; 1.23cm^3 volume. Post debridement Stage noted as Category/Stage II. Character of Wound/Ulcer Post Debridement is improved. Post procedure Diagnosis Wound #3: Same as Pre-Procedure Plan Wound Cleansing: Wound #1 Right,Lateral Malleolus: Clean wound with Normal Saline. Cleanse wound with mild soap and water May Shower, gently pat wound dry prior to applying new dressing. Wound #2 Left,Lateral Malleolus: Clean wound with Normal Saline. Cleanse wound with mild soap and water May Shower, gently pat wound dry prior to applying new dressing. Wound #3 Left Calcaneus: Clean wound with Normal Saline. Cleanse wound with mild soap and water May Shower, gently pat wound dry prior to applying new dressing. Anesthetic (add to Medication List): Wound #1 Right,Lateral Malleolus: ODALIS, JORDAN ([id number]) Topical Lidocaine 4% cream applied to wound bed prior to debridement (In Clinic Only). Wound #2 Left,Lateral Malleolus: Topical Lidocaine 4% cream applied to wound bed prior to debridement (In Clinic Only). Wound #3 Left Calcaneus: Topical Lidocaine 4% cream applied to wound bed prior to debridement (In Clinic Only). Primary Wound Dressing: Wound #1 Right,Lateral Malleolus: Silver Collagen Wound #2 Left,Lateral Malleolus: Silver Alginate Wound #3 Left  Calcaneus: Iodoflex Secondary Dressing: Wound #1 Right,Lateral Malleolus: Boardered Foam Dressing Wound #2 Left,Lateral Malleolus: ABD and Kerlix/Conform - secure with netting Wound #3 Left Calcaneus: ABD and Kerlix/Conform - secure with netting Dressing Change Frequency: Wound #1 Right,Lateral Malleolus: Change Dressing Monday, Wednesday, Friday Wound #2 Left,Lateral Malleolus: Change Dressing Monday, Wednesday, Friday Wound #3 Left Calcaneus: Change Dressing Monday, Wednesday, Friday Follow-up Appointments: Wound #1 Right,Lateral Malleolus: Return Appointment in 1 week. Wound #2 Left,Lateral Malleolus: Return Appointment in 1 week. Wound #3 Left Calcaneus: Return Appointment in 1 week. Edema Control: Wound #1 Right,Lateral Malleolus: Patient to wear own compression stockings Patient to wear own Velcro compression garment. Elevate legs to the level of the heart and pump ankles as often as possible Wound #2 Left,Lateral Malleolus: Patient to wear own compression stockings Patient to wear own Velcro compression garment. Elevate legs to the level of the heart and pump ankles as often as possible Off-Loading: Wound #1 Right,Lateral Malleolus: Turn and reposition every 2 hours Other: - do not put pressure on your ankles Wound #2 Left,Lateral Malleolus: Turn and reposition every 2 hours Other: - do not put pressure on your ankles Additional Orders / Instructions: Wound #1 Right,Lateral Malleolus: Increase protein intake. Wound #2 Left,Lateral Malleolus: Increase protein intake. Home Health: Wound #1 Right,Lateral Malleolus: [place] Visits - [place] Nurse may visit PRN to address patient s wound care needs. FACE TO FACE ENCOUNTER: MEDICARE and MEDICAID PATIENTS: I certify that this patient is under my care and that I had a face-to-face encounter that meets the physician face-to-face encounter requirements with this patient on this date.  The JENNIAH, BHAVSAR ([id number]) encounter with the patient was in whole or in part for the following MEDICAL CONDITION: (primary reason for [place]) MEDICAL NECESSITY: I certify, that based on my findings, NURSING services are a medically necessary home health service. HOME BOUND STATUS: I certify that my clinical findings support that this patient is homebound (i.e., Due to illness or injury, pt requires aid of supportive devices such as crutches, cane, wheelchairs, walkers, the use of special transportation or the assistance of another person to leave their place of residence. There is a normal inability to leave the home and doing so requires considerable and taxing effort. Other absences are for medical reasons / religious services and are infrequent or of short duration when for  other reasons). If current dressing causes regression in wound condition, may D/C ordered dressing product/s and apply Normal Saline Moist Dressing daily until next [place] / Other MD appointment. [place] of regression in wound condition at [phone]. Please direct any NON-WOUND related issues/requests for orders to patient's Primary Care Physician Wound #2 Left,Lateral Malleolus: [place] Visits - [place] Nurse may visit PRN to address patient s wound care needs. FACE TO FACE ENCOUNTER: MEDICARE and MEDICAID PATIENTS: I certify that this patient is under my care and that I had a face-to-face encounter that meets the physician face-to-face encounter requirements with this patient on this date. The encounter with the patient was in whole or in part for the following MEDICAL CONDITION: (primary reason for [place]) MEDICAL NECESSITY: I certify, that based on my findings, NURSING services are a medically necessary home health service. HOME BOUND STATUS: I certify that my clinical findings support that this patient is homebound (i.e., Due  to illness or injury, pt requires aid of supportive devices such as crutches, cane, wheelchairs, walkers, the use of special transportation or the assistance of another person to leave their place of residence. There is a normal inability to leave the home and doing so requires considerable and taxing effort. Other absences are for medical reasons / religious services and are infrequent or of short duration when for other reasons). If current dressing causes regression in wound condition, may D/C ordered dressing product/s and apply Normal Saline Moist Dressing daily until next [place] / Other MD appointment. [place] of regression in wound condition at [phone]. Please direct any NON-WOUND related issues/requests for orders to patient's Primary Care Physician Wound #3 Left Calcaneus: [place] Visits - [place] Nurse may visit PRN to address patient s wound care needs. FACE TO FACE ENCOUNTER: MEDICARE and MEDICAID PATIENTS: I certify that this patient is under my care and that I had a face-to-face encounter that meets the physician face-to-face encounter requirements with this patient on this date. The encounter with the patient was in whole or in part for the following MEDICAL CONDITION: (primary reason for [place]) MEDICAL NECESSITY: I certify, that based on my findings, NURSING services are a medically necessary home health service. HOME BOUND STATUS: I certify that my clinical findings support that this patient is homebound (i.e., Due to illness or injury, pt requires aid of supportive devices such as crutches, cane, wheelchairs, walkers, the use of special transportation or the assistance of another person to leave their place of residence. There is a normal inability to leave the home and doing so requires considerable and taxing effort. Other absences are for medical reasons / religious services and are infrequent or of  short duration when for other reasons). If current dressing causes regression in wound condition, may D/C ordered dressing product/s and apply Normal Saline Moist Dressing daily until next [place] / Other MD appointment. [place] of regression in wound condition at [phone]. Please direct any NON-WOUND related issues/requests for orders to patient's Primary Care Physician At this point my suggestion is gonna be that we go ahead and initiate the above wound care measures over the next week. She's in agreement with this plan. Subsequently we will see were things stand at follow-up. If anything changes or worsens in the meantime they will let me know. Please see above for specific wound care orders. We will see patient for re-evaluation  in 1 week(s) here in the clinic. If anything worsens or changes patient will contact our office for additional recommendations. Electronic Signature(s) Signed: 11/15/2018 8:01:46 PM By: Worthy Keeler PA-C Entered By: Worthy Keeler on 11/15/2018 11:04:55 P[place]inger, [name] ([id number]) [name] ([id number]) -------------------------------------------------------------------------------- ROS/PFSH Details Patient Name: [name] Date of Service: 11/15/2018 10:00 AM Medical Record Number: [id number] Patient Account Number: [id number] Date of Birth/Sex: Aug 19, 1923 (84 y.o. F) Treating RN: Montey Hora Primary Care Provider: Grayland Ormond Other Clinician: Referring Provider: Grayland Ormond Treating Provider/Extender: Melburn Hake, Loy Little Weeks in Treatment: 26 Information Obtained From Patient Wound History Do you currently have one or more open woundso Yes How many open wounds do you currently haveo 1 Approximately how long have you had your woundso 2 yrs How have you been treating your wound(s) until nowo mupirocin, soaking in epsom salt Has your wound(s) ever healed and then re-openedo No Have you  had any lab work done in the past montho No Have you tested positive for an antibiotic resistant organism (MRSA, VRE)o No Have you tested positive for osteomyelitis (bone infection)o No Have you had any tests for circulation on your legso Yes Who ordered the testo Dr. Lucky Cowboy Where was the test doneo [facility] Constitutional Symptoms (General Health) Complaints and Symptoms: Negative for: Fever; Chills Eyes Medical History: Positive for: Cataracts - surgery Respiratory Complaints and Symptoms: No Complaints or Symptoms Cardiovascular Complaints and Symptoms: No Complaints or Symptoms Medical History: Positive for: Congestive Heart Failure; Hypertension Musculoskeletal Medical History: Positive for: Osteoarthritis Neurologic Medical History: Positive for: Neuropathy MYSTY, KIELTY ([id number]) Oncologic Medical History: Negative for: Received Chemotherapy; Received Radiation Psychiatric Complaints and Symptoms: No Complaints or Symptoms HBO Extended History Items Eyes: Cataracts Immunizations Pneumococcal Vaccine: Received Pneumococcal Vaccination: Yes Implantable Devices Family and Social History Cancer: Yes - Father,Siblings; Diabetes: No; Heart Disease: Yes - Siblings; Hereditary Spherocytosis: No; Hypertension: No; Kidney Disease: No; Lung Disease: No; Seizures: No; Stroke: No; Thyroid Problems: No; Tuberculosis: No; Never smoker; Marital Status - Married; Alcohol Use: Never; Drug Use: No History; Caffeine Use: Rarely; Financial Concerns: No; Food, Clothing or Shelter Needs: No; Support System Lacking: No; Transportation Concerns: No; Advanced Directives: No; Patient does not want information on Advanced Directives; Do not resuscitate: No; Living Will: Yes (Not Provided); Medical Power of Attorney: No Physician Affirmation I have reviewed and agree with the above information. Electronic Signature(s) Signed: 11/15/2018 5:23:14 PM By: Montey Hora Signed: 11/15/2018  8:01:46 PM By: Worthy Keeler PA-C Entered By: Worthy Keeler on 11/15/2018 10:57:09 Finks, [name] ([id number]) -------------------------------------------------------------------------------- SuperBill Details Patient Name: [name] Date of Service: 11/15/2018 Medical Record Number: [id number] Patient Account Number: [id number] Date of Birth/Sex: Dec 11, 1922 (84 y.o. 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 I70.243 Atherosclerosis of native arteries of left leg with ulceration of ankle I89.0 Lymphedema, not elsewhere classified B35.4 Tinea corporis 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, left ankle and foot Facility Procedures CPT4 Code: [id number] Description: 83382 - DEB SUBQ TISSUE 20 SQ CM/< ICD-10 Diagnosis Description L89.622 Pressure ulcer of left heel, stage 2 Modifier: Quantity: 1 Physician Procedures CPT4 Code:  9980699 Description: 99214 - WC PHYS LEVEL 4 - EST PT ICD-10 Diagnosis Description 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, stage 4 L89.524 Pressure ulcer of left ankle, stage 4 S90.822A Blister (nonthermal), left foot, initial encounter P67.227 Pressure ulcer of  left heel, stage 2 Modifier: 25 Quantity: 1 CPT4 Code: 7375051 Description: 07125 - WC PHYS SUBQ TISS 20 SQ CM ICD-10 Diagnosis Description L89.622 Pressure ulcer of left heel, stage 2 Modifier: Quantity: 1 Electronic Signature(s) Signed: 11/15/2018 8:01:46 PM By: Worthy Keeler PA-C Entered By: Worthy Keeler on  11/15/2018 11:04:11

## 2018-11-18 ENCOUNTER — Telehealth: Payer: Self-pay

## 2018-11-18 ENCOUNTER — Ambulatory Visit (INDEPENDENT_AMBULATORY_CARE_PROVIDER_SITE_OTHER): Payer: Medicare Other | Admitting: Internal Medicine

## 2018-11-18 ENCOUNTER — Encounter: Payer: Self-pay | Admitting: Internal Medicine

## 2018-11-18 VITALS — BP 121/76 | HR 69 | Temp 98.7°F | Wt 151.0 lb

## 2018-11-18 DIAGNOSIS — M86672 Other chronic osteomyelitis, left ankle and foot: Secondary | ICD-10-CM | POA: Diagnosis not present

## 2018-11-18 DIAGNOSIS — Z452 Encounter for adjustment and management of vascular access device: Secondary | ICD-10-CM | POA: Diagnosis not present

## 2018-11-18 DIAGNOSIS — R110 Nausea: Secondary | ICD-10-CM | POA: Diagnosis not present

## 2018-11-18 DIAGNOSIS — Z5181 Encounter for therapeutic drug level monitoring: Secondary | ICD-10-CM | POA: Diagnosis not present

## 2018-11-18 MED ORDER — ONDANSETRON HCL 4 MG PO TABS: 4.0000 mg | ORAL_TABLET | Freq: Three times a day (TID) | ORAL | 1 refills | Status: DC | PRN

## 2018-11-18 NOTE — Assessment & Plan Note (Signed)
Weekly labs per home health

## 2018-11-18 NOTE — Progress Notes (Signed)
Patient ID: [name], female   DOB: [birth year], 84 y.o.   MRN: [id number]   Subjective:    Patient ID: [name], female    DOB: [birth year], 84 y.o.   MRN: [id number]  HPI Here for follow up of ostemyelitis.  She was intially referred due to concern for osteomyelitis and ended up in the hospital with non-healing left lateral malleolus ulcer.  A recent bone culture done at wound care by Dr. Grayling Congress with [facility].  In the hospital she was noted to have cellulitis and swelling and put on IV cefazolin and improved some and sent out on Keflex.  She though has continued the Augmentin since she still had it.  She underwent a CT evaluation of the left malleolus which was concerning for osteomyleitis with lytic areas on the bone and exposure of the bone.  She is known to have poor vasculature overall.   I started her on Zyvox which she took for 2 weeks.  She has significant side effects including some loose stools, less than 5/day, significant fatigue and malaise she feelt was associated with her antibiotic.   I then started her on Keflex and took for about 1 month.  Her labs had been reasurring and ulcer healing and stopped antibiotics.  She though came back with continued non-healing ulcer that probes to bone of the left lateral malleolus.  She has a hsitory of MSSA culture as well as Pseudomonas, pansensitive.  She also sees vascular surgery and underwent treatment for her stenosis to help with healing.  Started on Cefepime about 11/25 and on it since.  Some nausea.     ROS: no fever    Objective:    PE:  Constitutional: alert, nad Vitals:   11/18/18 1046  Weight: 151 lb (68.5 kg)  CV: RRR Lungs: CTA B; normal respiratory effort MS: left leg lateral malleolus with open ulcer, some drainage; left foot ulcer over heal and does not probe to bone Skin : no rashes  SH: lives in Assisted living facility    Assessment & Plan:

## 2018-11-18 NOTE — Telephone Encounter (Signed)
Per [name] called [facility] home [facility] to confirm order for antibiotic end date on 12/09/18, and have picc line pulled after last dose. Spoke with Thurmond Butts, Rn who states the patient is not under the care of [facility] in [place], but is under care under [facility] office. Thurmond Butts, Rn will call home health nurse to make sure they have antibiotic end date for 12/09/18 and pull picc orders. Will call office incase orders need to be sent. [facility]: [phone] [place]und[place] Rud, [place]

## 2018-11-18 NOTE — Assessment & Plan Note (Signed)
She notices a bit of discomfort with the line but working well.  No other issues.

## 2018-11-18 NOTE — Assessment & Plan Note (Signed)
Will continue on cefepime for 6 weeks through 12/09/18 and then stop. Can have picc line pulled after treatment completion.   Will follow up about that time.

## 2018-11-19 NOTE — Telephone Encounter (Signed)
[place] to confirm orders for antibiotic end date and pull picc orders. Spoke with Princess Bruins, Rn who was able to confirm antibiotic end date for 12/09/18 and confirm picc line will be pulled after last dose. [place]

## 2018-11-22 ENCOUNTER — Ambulatory Visit: Payer: Medicare Other | Admitting: Physician Assistant

## 2018-11-22 ENCOUNTER — Encounter: Payer: Medicare Other | Admitting: Physician Assistant

## 2018-11-22 DIAGNOSIS — L89514 Pressure ulcer of right ankle, stage 4: Secondary | ICD-10-CM | POA: Diagnosis not present

## 2018-11-27 NOTE — Progress Notes (Signed)
SAPRINA, CHUONG ([id number]) Visit Report for 11/22/2018 Arrival Information Details Patient Name: RASHAWNDA, GABA Date of Service: 11/22/2018 3:00 PM Medical Record Number: [id number] Patient Account Number: [id number] Date of Birth/Sex: June 23, 1923 (84 y.o. F) Treating RN: Secundino Ginger Primary Care Levita Monical: Grayland Ormond Other Clinician: Referring Larico Dimock: Grayland Ormond Treating Amandalynn Pitz/Extender: Melburn Hake, HOYT Weeks in Treatment: 28 Visit Information History Since Last Visit Added or deleted any medications: No Patient Arrived: Ambulatory Any new allergies or adverse reactions: No Arrival Time: 15:21 Had a fall or experienced change in No Accompanied By: daughter activities of daily living that may affect Transfer Assistance: None risk of falls: Patient Requires Transmission-Based No Signs or symptoms of abuse/neglect since last visito No Precautions: Hospitalized since last visit: No Patient Has Alerts: Yes Implantable device outside of the clinic excluding No Patient Alerts: ABI AVVS 03/29/18 L cellular tissue based products placed in the center 1.05 since last visit: R 1.03, TBI L .56, Has Dressing in Place as Prescribed: Yes R .85 ABI AVVS 07/23/18 Pain Present Now: Yes L .95 R 1.26 Electronic Signature(s) Signed: 11/22/2018 4:48:15 PM By: Secundino Ginger Entered By: Secundino Ginger on 11/22/2018 15:23:14 Macek, [name] ([id number]) -------------------------------------------------------------------------------- Encounter Discharge Information Details Patient Name: [name] Date of Service: 11/22/2018 3:00 PM Medical Record Number: [id number] Patient Account Number: [id number] Date of Birth/Sex: 09/02/1923 (84 y.o. F) Treating RN: Montey Hora Primary Care Tijuana Scheidegger: Grayland Ormond Other Clinician: Referring Darien Mignogna: Grayland Ormond Treating Alyssia Heese/Extender: Melburn Hake, HOYT Weeks in Treatment: 85 Encounter Discharge Information Items Post  Procedure Vitals Discharge Condition: Stable Temperature (F): 98.1 Ambulatory Status: Ambulatory Pulse (bpm): 90 Discharge Destination: Home Respiratory Rate (breaths/min): 18 Transportation: Private Auto Blood Pressure (mmHg): 144/68 Accompanied By: daughter Schedule Follow-up Appointment: Yes Clinical Summary of Care: Electronic Signature(s) Signed: 11/22/2018 5:12:41 PM By: Montey Hora Entered By: Montey Hora on 11/22/2018 16:34:02 Postlewaite, [name] ([id number]) -------------------------------------------------------------------------------- Lower Extremity Assessment Details Patient Name: [name] Date of Service: 11/22/2018 3:00 PM Medical Record Number: [id number] Patient Account Number: [id number] Date of Birth/Sex: [birth year] (84 y.o. F) Treating RN: Secundino Ginger Primary Care Lilianne Delair: Grayland Ormond Other Clinician: Referring Iyla Balzarini: Grayland Ormond Treating Bascom Biel/Extender: Melburn Hake, HOYT Weeks in Treatment: 67 Edema Assessment Assessed: [Left: No] [Right: No] [Left: Edema] [Right: :] Calf Left: Right: Point of Measurement: 30 cm From Medial Instep 31 cm 28 cm Ankle Left: Right: Point of Measurement: 12 cm From Medial Instep 21 cm 20 cm Vascular Assessment Claudication: Claudication Assessment [Left:None] [Right:None] Pulses: Dorsalis Pedis Palpable: [Left:Yes] [Right:Yes] Posterior Tibial Extremity colors, hair growth, and conditions: Extremity Color: [Left:Hyperpigmented] [Right:Hyperpigmented] Hair Growth on Extremity: [Left:No] [Right:No] Temperature of Extremity: [Left:Cool] [Right:Cool] Capillary Refill: [Left:< 3 seconds] [Right:< 3 seconds] Toe Nail Assessment Left: Right: Thick: Yes Yes Discolored: Yes Yes Deformed: Yes Yes Improper Length and Hygiene: No No Electronic Signature(s) Signed: 11/22/2018 4:48:15 PM By: Secundino Ginger Entered By: Secundino Ginger on 11/22/2018 15:37:09 Brozek, [name]  ([id number]) -------------------------------------------------------------------------------- Multi Wound Chart Details Patient Name: [name] Date of Service: 11/22/2018 3:00 PM Medical Record Number: [id number] Patient Account Number: [id number] Date of Birth/Sex: 1923-11-23 (84 y.o. F) Treating RN: Harold Barban Primary Care Ghazi Rumpf: Grayland Ormond Other Clinician: Referring Aideliz Garmany: Grayland Ormond Treating Dava Rensch/Extender: STONE III, HOYT Weeks in Treatment: 67 Vital Signs Height(in): 65 Pulse(bpm): 90 Weight(lbs): 154.3 Blood Pressure(mmHg): 144/68 Body Mass Index(BMI): 26 Temperature(F): 98.1 Respiratory Rate 16 (breaths/min): Photos: [1:No Photos] [2:No Photos] [3:No Photos] Wound Location: [1:Right Malleolus - Lateral] [2:Left Malleolus - Lateral] [3:Left Calcaneus]  Wounding Event: [1:Gradually Appeared] [2:Gradually Appeared] [3:Gradually Appeared] Primary Etiology: [1:Pressure Ulcer] [2:Pressure Ulcer] [3:Pressure Ulcer] Secondary Etiology: [1:Arterial Insufficiency Ulcer] [2:Arterial Insufficiency Ulcer] [3:N/A] Comorbid History: [1:Cataracts, Congestive Heart Failure, Hypertension, Osteoarthritis, Neuropathy] [2:Cataracts, Congestive Heart Failure, Hypertension, Osteoarthritis, Neuropathy] [3:Cataracts, Congestive Heart Failure, Hypertension, Osteoarthritis,  Neuropathy] Date Acquired: [1:08/11/2015] [2:04/12/2018] [3:10/04/2018] Weeks of Treatment: [1:67] [2:31] [3:6] Wound Status: [1:Open] [2:Open] [3:Open] Measurements L x W x D [1:0.2x0.2x0.1] [2:0.9x0.9x0.7] [3:2x2.7x0.1] (cm) Area (cm) : [1:0.031] [2:0.636] [3:4.241] Volume (cm) : [1:0.003] [2:0.445] [3:0.424] % Reduction in Area: [1:98.00%] [2:19.00%] [3:-54.30%] % Reduction in Volume: [1:99.30%] [2:-463.30%] [3:22.90%] Position 1 (o'clock): [2:4] Maximum Distance 1 (cm): [2:1.4] Tunneling: [1:No] [2:Yes] [3:No] Classification: [1:Category/Stage IV] [2:Category/Stage IV] [3:Category/Stage  II] Exudate Amount: [1:Small] [2:Medium] [3:Large] Exudate Type: [1:Serous] [2:Serous] [3:Serous] Exudate Color: [1:amber] [2:amber] [3:amber] Wound Margin: [1:Distinct, outline attached] [2:Epibole] [3:Indistinct, nonvisible] Granulation Amount: [1:None Present (0%)] [2:Small (1-33%)] [3:Small (1-33%)] Granulation Quality: [1:N/A] [2:Pink, Pale] [3:Hyper-granulation] Necrotic Amount: [1:Small (1-33%)] [2:Large (67-100%)] [3:Large (67-100%)] Necrotic Tissue: [1:Adherent Slough] [2:Adherent Slough] [3:Eschar, Adherent Slough] Exposed Structures: [1:Fat Layer (Subcutaneous Tissue) Exposed: Yes Fascia: No Tendon: No Muscle: No Joint: No Bone: No] [2:Fat Layer (Subcutaneous Tissue) Exposed: Yes Fascia: No Tendon: No Muscle: No Joint: No Bone: No] [3:Fat Layer (Subcutaneous Tissue) Exposed: Yes  Fascia: No Tendon: No Muscle: No Joint: No Bone: No] Epithelialization: [1:Medium (34-66%)] [2:None] [3:None] Periwound Skin Texture: Excoriation: No Excoriation: No Excoriation: No Induration: No Induration: No Induration: No Callus: No Callus: No Callus: No Crepitus: No Crepitus: No Crepitus: No Rash: No Rash: No Rash: No Scarring: No Scarring: No Scarring: No Periwound Skin Moisture: Maceration: No Maceration: Yes Maceration: Yes Dry/Scaly: No Dry/Scaly: No Dry/Scaly: No Periwound Skin Color: Rubor: Yes Atrophie Blanche: No Atrophie Blanche: No Atrophie Blanche: No Cyanosis: No Cyanosis: No Cyanosis: No Ecchymosis: No Ecchymosis: No Ecchymosis: No Erythema: No Erythema: No Erythema: No Hemosiderin Staining: No Hemosiderin Staining: No Hemosiderin Staining: No Mottled: No Mottled: No Mottled: No Pallor: No Pallor: No Pallor: No Rubor: No Rubor: No Temperature: No Abnormality No Abnormality No Abnormality Tenderness on Palpation: Yes Yes Yes Wound Preparation: Ulcer Cleansing: Ulcer Cleansing: Ulcer Cleansing: Rinsed/Irrigated with Saline Rinsed/Irrigated with  Saline Rinsed/Irrigated with Saline Topical Anesthetic Applied: Topical Anesthetic Applied: Topical Anesthetic Applied: Other: lidocaine 4% Other: lidocaine 4% Other: lidocaine 4% Treatment Notes Electronic Signature(s) Signed: 11/26/2018 1:37:43 PM By: Harold Barban Entered By: Harold Barban on 11/22/2018 15:42:27 Aufiero, [name] ([id number]) -------------------------------------------------------------------------------- [place] Details Patient Name: [name] Date of Service: 11/22/2018 3:00 PM Medical Record Number: [id number] Patient Account Number: [id number] Date of Birth/Sex: [birth year] (84 y.o. F) Treating RN: Harold Barban Primary Care Natalin Bible: Grayland Ormond Other Clinician: Referring Durrell Barajas: Grayland Ormond Treating Nahia Nissan/Extender: Melburn Hake, HOYT Weeks in Treatment: 55 Active Inactive Abuse / Safety / Falls / Self Care Management Nursing Diagnoses: Potential for falls Goals: Patient will not experience any injury related to falls Date Initiated: 08/10/2017 Target Resolution Date: 11/10/2017 Goal Status: Active Interventions: Assess Activities of Daily Living upon admission and as needed Assess fall risk on admission and as needed Assess: immobility, friction, shearing, incontinence upon admission and as needed Notes: Nutrition Nursing Diagnoses: Imbalanced nutrition Potential for alteratiion in Nutrition/Potential for imbalanced nutrition Goals: Patient/caregiver agrees to and verbalizes understanding of need to use nutritional supplements and/or vitamins as prescribed Date Initiated: 08/10/2017 Target Resolution Date: 12/08/2017 Goal Status: Active Interventions: Assess patient nutrition upon admission and as needed per policy Notes: Orientation to the Wound Care Program Nursing Diagnoses: Knowledge deficit related to  the wound healing center program Goals: Patient/caregiver will verbalize understanding of the  [facility] Date Initiated: 08/10/2017 Target Resolution Date: 09/08/2017 Goal Status: Active DEKISHA, MESMER ([id number]) Interventions: Provide education on orientation to the wound center Notes: Pain, Acute or Chronic Nursing Diagnoses: Pain, acute or chronic: actual or potential Potential alteration in comfort, pain Goals: Patient/caregiver will verbalize adequate pain control between visits Date Initiated: 08/10/2017 Target Resolution Date: 12/08/2017 Goal Status: Active Interventions: Complete pain assessment as per visit requirements Notes: Wound/Skin Impairment Nursing Diagnoses: Impaired tissue integrity Knowledge deficit related to ulceration/compromised skin integrity Goals: Ulcer/skin breakdown will have a volume reduction of 80% by week 12 Date Initiated: 08/10/2017 Target Resolution Date: 12/01/2017 Goal Status: Active Interventions: Assess patient/caregiver ability to perform ulcer/skin care regimen upon admission and as needed Notes: Electronic Signature(s) Signed: 11/26/2018 1:37:43 PM By: Harold Barban Entered By: Harold Barban on 11/22/2018 15:42:11 Ansley, [name] ([id number]) -------------------------------------------------------------------------------- Pain Assessment Details Patient Name: [name] Date of Service: 11/22/2018 3:00 PM Medical Record Number: [id number] Patient Account Number: [id number] Date of Birth/Sex: 1923-02-13 (84 y.o. F) Treating RN: Secundino Ginger Primary Care Yojan Paskett: Grayland Ormond Other Clinician: Referring Mirah Nevins: Grayland Ormond Treating Winfred Redel/Extender: Melburn Hake, HOYT Weeks in Treatment: 46 Active Problems Location of Pain Severity and Description of Pain Patient Has Paino No Site Locations Pain Management and Medication Current Pain Management: Notes pt stated has intermittent pain . enc. to see primary PRN. Electronic Signature(s) Signed: 11/22/2018 4:48:15 PM By: Secundino Ginger Entered By: Secundino Ginger on 11/22/2018 15:23:49 Dougan, [name] ([id number]) -------------------------------------------------------------------------------- Patient/Caregiver Education Details Patient Name: [name] Date of Service: 11/22/2018 3:00 PM Medical Record Number: [id number] Patient Account Number: [id number] Date of Birth/Gender: 04/17/1923 (84 y.o. F) Treating RN: Harold Barban Primary Care Physician: Grayland Ormond Other Clinician: Referring Physician: Grayland Ormond Treating Physician/Extender: Sharalyn Ink in Treatment: 35 Education Assessment Education Provided To: Patient Education Topics Provided Electronic Signature(s) Signed: 11/26/2018 1:37:43 PM By: Harold Barban Entered By: Harold Barban on 11/22/2018 15:42:36 Deguia, [name] ([id number]) -------------------------------------------------------------------------------- Wound Assessment Details Patient Name: [name] Date of Service: 11/22/2018 3:00 PM Medical Record Number: [id number] Patient Account Number: [id number] Date of Birth/Sex: [birth year] (84 y.o. F) Treating RN: Secundino Ginger Primary Care Lemya Greenwell: Grayland Ormond Other Clinician: Referring Isela Stantz: Grayland Ormond Treating Niambi Smoak/Extender: STONE III, HOYT Weeks in Treatment: 32 Wound Status Wound Number: 1 Primary Pressure Ulcer Etiology: Wound Location: Right Malleolus - Lateral Secondary Arterial Insufficiency Ulcer Wounding Event: Gradually Appeared Etiology: Date Acquired: 08/11/2015 Wound Status: Open Weeks Of Treatment: 67 Comorbid Cataracts, Congestive Heart Failure, Clustered Wound: No History: Hypertension, Osteoarthritis, Neuropathy Photos Wound Measurements Length: (cm) 0.2 Width: (cm) 0.2 Depth: (cm) 0.1 Area: (cm) 0.031 Volume: (cm) 0.003 % Reduction in Area: 98% % Reduction in Volume: 99.3% Epithelialization: Medium (34-66%) Tunneling: No Undermining: No Wound  Description Classification: Category/Stage IV Foul Odor Wound Margin: Distinct, outline attached Slough/Fib Exudate Amount: Small Exudate Type: Serous Exudate Color: amber After Cleansing: No rino Yes Wound Bed Granulation Amount: None Present (0%) Exposed Structure Necrotic Amount: Small (1-33%) Fascia Exposed: No Necrotic Quality: Adherent Slough Fat Layer (Subcutaneous Tissue) Exposed: Yes Tendon Exposed: No Muscle Exposed: No Joint Exposed: No Bone Exposed: No Periwound Skin Texture Texture Color Critzer, [name] ([id number]) No Abnormalities Noted: No No Abnormalities Noted: No Callus: No Atrophie Blanche: No Crepitus: No Cyanosis: No Excoriation: No Ecchymosis: No Induration: No Erythema: No Rash: No Hemosiderin Staining: No Scarring: No Mottled: No Pallor: No Moisture Rubor:  Yes No Abnormalities Noted: No Dry / Scaly: No Temperature / Pain Maceration: No Temperature: No Abnormality Tenderness on Palpation: Yes Wound Preparation Ulcer Cleansing: Rinsed/Irrigated with Saline Topical Anesthetic Applied: Other: lidocaine 4%, Treatment Notes Wound #1 (Right, Lateral Malleolus) Notes prisma, gauze/abd/conform to toe and calcaneous, bordered foam dressing to malleolus wounds Electronic Signature(s) Signed: 11/22/2018 4:48:15 PM By: Secundino Ginger Entered By: Secundino Ginger on 11/22/2018 16:09:02 [place], [name] ([id number]) -------------------------------------------------------------------------------- Wound Assessment Details Patient Name: [name] Date of Service: 11/22/2018 3:00 PM Medical Record Number: [id number] Patient Account Number: [id number] Date of Birth/Sex: 1923-04-07 (84 y.o. F) Treating RN: Secundino Ginger Primary Care Nathian Stencil: Grayland Ormond Other Clinician: Referring Isahia Hollerbach: Grayland Ormond Treating Addelyn Alleman/Extender: STONE III, HOYT Weeks in Treatment: 31 Wound Status Wound Number: 2 Primary Pressure Ulcer Etiology: Wound  Location: Left Malleolus - Lateral Secondary Arterial Insufficiency Ulcer Wounding Event: Gradually Appeared Etiology: Date Acquired: 04/12/2018 Wound Status: Open Weeks Of Treatment: 31 Comorbid Cataracts, Congestive Heart Failure, Clustered Wound: No History: Hypertension, Osteoarthritis, Neuropathy Photos Wound Measurements Length: (cm) 0.9 % Reduction i Width: (cm) 0.9 % Reduction i Depth: (cm) 0.7 Epithelializa Area: (cm) 0.636 Tunneling: Volume: (cm) 0.445 Position Maximum Di n Area: 19% n Volume: -463.3% tion: None Yes (o'clock): 4 stance: (cm) 1.4 Undermining: No Wound Description Classification: Category/Stage IV Foul Odor Aft Wound Margin: Epibole Slough/Fibrin Exudate Amount: Medium Exudate Type: Serous Exudate Color: amber er Cleansing: No o Yes Wound Bed Granulation Amount: Small (1-33%) Exposed Structure Granulation Quality: Pink, Pale Fascia Exposed: No Necrotic Amount: Large (67-100%) Fat Layer (Subcutaneous Tissue) Exposed: Yes Necrotic Quality: Adherent Slough Tendon Exposed: No Muscle Exposed: No Joint Exposed: No Schwake, [name] ([id number]) Bone Exposed: No Periwound Skin Texture Texture Color No Abnormalities Noted: No No Abnormalities Noted: No Callus: No Atrophie Blanche: No Crepitus: No Cyanosis: No Excoriation: No Ecchymosis: No Induration: No Erythema: No Rash: No Hemosiderin Staining: No Scarring: No Mottled: No Pallor: No Moisture Rubor: No No Abnormalities Noted: No Dry / Scaly: No Temperature / Pain Maceration: Yes Temperature: No Abnormality Tenderness on Palpation: Yes Wound Preparation Ulcer Cleansing: Rinsed/Irrigated with Saline Topical Anesthetic Applied: Other: lidocaine 4%, Treatment Notes Wound #2 (Left, Lateral Malleolus) Notes prisma, gauze/abd/conform to toe and calcaneous, bordered foam dressing to malleolus wounds Electronic Signature(s) Signed: 11/22/2018 4:48:15 PM By: Secundino Ginger Entered By: Secundino Ginger on 11/22/2018 16:07:45 Marmo, [name] ([id number]) -------------------------------------------------------------------------------- Wound Assessment Details Patient Name: [name] Date of Service: 11/22/2018 3:00 PM Medical Record Number: [id number] Patient Account Number: [id number] Date of Birth/Sex: 12/11/1922 (84 y.o. F) Treating RN: Secundino Ginger Primary Care Dante Roudebush: Grayland Ormond Other Clinician: Referring Gayanne Prescott: Grayland Ormond Treating Alba Perillo/Extender: STONE III, HOYT Weeks in Treatment: 56 Wound Status Wound Number: 3 Primary Pressure Ulcer Etiology: Wound Location: Left Calcaneus Wound Open Wounding Event: Gradually Appeared Status: Date Acquired: 10/04/2018 Comorbid Cataracts, Congestive Heart Failure, Weeks Of Treatment: 6 History: Hypertension, Osteoarthritis, Neuropathy Clustered Wound: No Photos Photo Uploaded By: Secundino Ginger on 11/22/2018 16:03:15 Wound Measurements Length: (cm) 2 Width: (cm) 2.7 Depth: (cm) 0.1 Area: (cm) 4.241 Volume: (cm) 0.424 % Reduction in Area: -54.3% % Reduction in Volume: 22.9% Epithelialization: None Tunneling: No Undermining: No Wound Description Classification: Category/Stage II Foul Odor A Wound Margin: Indistinct, nonvisible Slough/Fibr Exudate Amount: Large Exudate Type: Serous Exudate Color: amber fter Cleansing: No ino Yes Wound Bed Granulation Amount: Small (1-33%) Exposed Structure Granulation Quality: Hyper-granulation Fascia Exposed: No Necrotic Amount: Large (67-100%) Fat Layer (Subcutaneous Tissue) Exposed: Yes Necrotic Quality: [place], Adherent Slough  Tendon Exposed: No Muscle Exposed: No Joint Exposed: No Bone Exposed: No Periwound Skin Texture Merrihew, [name] ([id number]) Texture Color No Abnormalities Noted: No No Abnormalities Noted: No Callus: No Atrophie Blanche: No Crepitus: No Cyanosis: No Excoriation: No Ecchymosis: No Induration:  No Erythema: No Rash: No Hemosiderin Staining: No Scarring: No Mottled: No Pallor: No Moisture Rubor: No No Abnormalities Noted: No Dry / Scaly: No Temperature / Pain Maceration: Yes Temperature: No Abnormality Tenderness on Palpation: Yes Wound Preparation Ulcer Cleansing: Rinsed/Irrigated with Saline Topical Anesthetic Applied: Other: lidocaine 4%, Treatment Notes Wound #3 (Left Calcaneus) Notes prisma, gauze/abd/conform to toe and calcaneous, bordered foam dressing to malleolus wounds Electronic Signature(s) Signed: 11/22/2018 4:48:15 PM By: Secundino Ginger Entered By: Secundino Ginger on 11/22/2018 15:34:07 Panchal, [name] ([id number]) -------------------------------------------------------------------------------- Wound Assessment Details Patient Name: [name] Date of Service: 11/22/2018 3:00 PM Medical Record Number: [id number] Patient Account Number: [id number] Date of Birth/Sex: 1923-07-06 (84 y.o. F) Treating RN: Harold Barban Primary Care Azarel Banner: Grayland Ormond Other Clinician: Referring Terryann Verbeek: Grayland Ormond Treating Nakima Fluegge/Extender: STONE III, HOYT Weeks in Treatment: 35 Wound Status Wound Number: 4 Primary Pressure Ulcer Etiology: Wound Location: Toe Second - Distal Wound Open Wounding Event: Pressure Injury Status: Date Acquired: 10/23/2018 Comorbid Cataracts, Congestive Heart Failure, Weeks Of Treatment: 0 History: Hypertension, Osteoarthritis, Neuropathy Clustered Wound: No Photos Photo Uploaded By: Harold Barban on 11/22/2018 17:10:47 Wound Measurements Length: (cm) 0.3 Width: (cm) 0.3 Depth: (cm) 0.1 Area: (cm) 0.071 Volume: (cm) 0.007 % Reduction in Area: 0% % Reduction in Volume: 0% Epithelialization: None Tunneling: No Undermining: No Wound Description Classification: Category/Stage II Wound Margin: Distinct, outline attached Exudate Amount: Small Exudate Type: Serous Exudate Color: amber Foul Odor After  Cleansing: No Slough/Fibrino Yes Wound Bed Granulation Amount: None Present (0%) Exposed Structure Necrotic Amount: Large (67-100%) Fascia Exposed: No Necrotic Quality: Eschar, Adherent Slough Fat Layer (Subcutaneous Tissue) Exposed: Yes Tendon Exposed: No Muscle Exposed: No Joint Exposed: No Bone Exposed: No Periwound Skin Texture Bugge, [name] ([id number]) Texture Color No Abnormalities Noted: No No Abnormalities Noted: No Callus: Yes Atrophie Blanche: No Crepitus: No Cyanosis: No Excoriation: No Ecchymosis: No Induration: No Erythema: No Rash: No Hemosiderin Staining: No Scarring: No Mottled: No Pallor: No Moisture Rubor: No No Abnormalities Noted: No Dry / Scaly: Yes Maceration: No Wound Preparation Ulcer Cleansing: Rinsed/Irrigated with Saline Treatment Notes Wound #4 (Distal Toe Second) Notes prisma, gauze/abd/conform to toe and calcaneous, bordered foam dressing to malleolus wounds Electronic Signature(s) Signed: 11/26/2018 1:37:43 PM By: Harold Barban Entered By: Harold Barban on 11/22/2018 15:47:00 Thurmon, [name] ([id number]) -------------------------------------------------------------------------------- Vitals Details Patient Name: [name] Date of Service: 11/22/2018 3:00 PM Medical Record Number: [id number] Patient Account Number: [id number] Date of Birth/Sex: 01/10/1923 (84 y.o. F) Treating RN: Secundino Ginger Primary Care Greyson Riccardi: Grayland Ormond Other Clinician: Referring Martasia Talamante: Grayland Ormond Treating Keylee Shrestha/Extender: Melburn Hake, HOYT Weeks in Treatment: 6 Vital Signs Time Taken: 15:23 Temperature (F): 98.1 Height (in): 65 Pulse (bpm): 90 Weight (lbs): 154.3 Respiratory Rate (breaths/min): 16 Body Mass Index (BMI): 25.7 Blood Pressure (mmHg): 144/68 Reference Range: 80 - 120 mg / dl Electronic Signature(s) Signed: 11/22/2018 4:48:15 PM By: Secundino Ginger Entered BySecundino Ginger on 11/22/2018 15:24:12

## 2018-11-27 NOTE — Progress Notes (Signed)
VIR[place], CURL ([id number]) Visit Report for 11/22/2018 Chief Complaint Document Details Patient Name: [name], [name]. Date of Service: 11/22/2018 3:00 PM Medical Record Number: [id number] Patient Account Number: [id number] Date of Birth/Sex: May 03, 1923 (84 y.o. F) Treating RN: Montey Hora Primary Care Provider: Grayland Ormond Other Clinician: Referring Provider: Grayland Ormond Treating Provider/Extender: Melburn Hake, HOYT Weeks in Treatment: 62 Information Obtained from: Patient Chief Complaint Patient is here for right lateral malleolus and left lateral malleolus ulcer Electronic Signature(s) Signed: 11/23/2018 12:35:32 AM By: Worthy Keeler PA-C Entered By: Worthy Keeler on 11/22/2018 15:04:42 [name], [name] ([id number]) -------------------------------------------------------------------------------- Debridement Details Patient Name: [name] Date of Service: 11/22/2018 3:00 PM Medical Record Number: [id number] Patient Account Number: [id number] Date of Birth/Sex: [birth year] (84 y.o. F) Treating RN: Harold Barban Primary Care Provider: Grayland Ormond Other Clinician: Referring Provider: Grayland Ormond Treating Provider/Extender: Melburn Hake, HOYT Weeks in Treatment: 67 Debridement Performed for Wound #3 Left Calcaneus Assessment: Performed By: Physician STONE III, HOYT E., PA-C Debridement Type: Debridement Level of Consciousness (Pre- Awake and Alert procedure): Pre-procedure Verification/Time Yes - 15:49 Out Taken: Start Time: 15:49 Pain Control: Lidocaine Total Area Debrided (L x W): 2 (cm) x 2.7 (cm) = 5.4 (cm) Tissue and other material Viable, Subcutaneous debrided: Level: Skin/Subcutaneous Tissue Debridement Description: Excisional Instrument: Curette Bleeding: Minimum Hemostasis Achieved: Pressure End Time: 15:52 Procedural Pain: 0 Post Procedural Pain: 0 Response to Treatment: Procedure was tolerated well Level of  Consciousness Awake and Alert (Post-procedure): Post Debridement Measurements of Total Wound Length: (cm) 2 Stage: Category/Stage II Width: (cm) 2.7 Depth: (cm) 0.2 Volume: (cm) 0.848 Character of Wound/Ulcer Post Improved Debridement: Post Procedure Diagnosis Same as Pre-procedure Electronic Signature(s) Signed: 11/23/2018 12:35:32 AM By: Worthy Keeler PA-C Signed: 11/26/2018 1:37:43 PM By: Harold Barban Entered By: Harold Barban on 11/22/2018 15:53:21 [name], [name] ([id number]) -------------------------------------------------------------------------------- Debridement Details Patient Name: [name] Date of Service: 11/22/2018 3:00 PM Medical Record Number: [id number] Patient Account Number: [id number] Date of Birth/Sex: [birth year] (84 y.o. F) Treating RN: Harold Barban Primary Care Provider: Grayland Ormond Other Clinician: Referring Provider: Grayland Ormond Treating Provider/Extender: Melburn Hake, HOYT Weeks in Treatment: 67 Debridement Performed for Wound #4 Distal Toe Second Assessment: Performed By: Physician STONE III, HOYT E., PA-C Debridement Type: Debridement Level of Consciousness (Pre- Awake and Alert procedure): Pre-procedure Verification/Time Yes - 15:49 Out Taken: Start Time: 15:49 Pain Control: Lidocaine Total Area Debrided (L x W): 2 (cm) x 2 (cm) = 4 (cm) Tissue and other material Non-Viable, Skin: Dermis debrided: Level: Skin/Dermis Debridement Description: Selective/Open Wound Instrument: Curette Bleeding: Minimum Hemostasis Achieved: Pressure End Time: 15:52 Procedural Pain: 0 Post Procedural Pain: 0 Response to Treatment: Procedure was tolerated well Level of Consciousness Awake and Alert (Post-procedure): Post Debridement Measurements of Total Wound Length: (cm) 0.6 Stage: Category/Stage II Width: (cm) 0.8 Depth: (cm) 0.1 Volume: (cm) 0.038 Character of Wound/Ulcer Post Improved Debridement: Post  Procedure Diagnosis Same as Pre-procedure Electronic Signature(s) Signed: 11/23/2018 12:35:32 AM By: Worthy Keeler PA-C Signed: 11/26/2018 1:37:43 PM By: Harold Barban Entered By: Harold Barban on 11/22/2018 16:01:20 [name], [name] ([id number]) -------------------------------------------------------------------------------- HPI Details Patient Name: [name] Date of Service: 11/22/2018 3:00 PM Medical Record Number: [id number] Patient Account Number: [id number] Date of Birth/Sex: 1923/07/28 (84 y.o. 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 Dr. Amalia Hailey the podiatrist saw her on 07/20/2017 and sent her to the wound center for possible hyperbaric oxygen therapy. past medical history of peripheral vascular disease, varicose veins, status post appendectomy, basal cell carcinoma excision from the left leg, cholecystectomy, pacemaker placement, right lower extremity angiography done by Dr. dew in March 2017 with placement of a stent. there is also note of a successful ablation of the right small saphenous vein done which was reviewed by ultrasound on 10/24/2016. the patient had a right small saphenous vein ablation done on 10/20/2016. The patient has never been a smoker. She has been seen by Dr. Corene Cornea dew the vascular surgeon who most recently saw her on 06/15/2017 for evaluation of ongoing problems with right leg swelling. She had a lower extremity arterial duplex examination done(02/13/17) which showed patent distal right superficial femoral artery stent and above-the-knee popliteal stent  without evidence of restenosis. The ABI was more than 1.3 on the right and more than 1.3 on the left. This was consistent with noncompressible arteries due to medial calcification. The right great toe pressure and PPG waveforms are within normal limits and the left great toe pressure and PPG waveforms are decreased. he recommended she continue to wear her compression stockings and continue with elevation. She is scheduled to have a noninvasive arterial study in the near future 08/16/2017 -- had a lower extremity arterial duplex examination done which showed patent distal right superficial femoral artery stent and above-the-knee popliteal stent without evidence of restenosis. The ABI was more than 1.3 on the right and more than 1.3 on the left. This was consistent with noncompressible arteries due to medial calcification. The right great toe pressure and PPG waveforms are within normal limits and the left great toe pressure and PPG waveforms are decreased. the x-ray of the right ankle has not yet been done 08/24/2017 -- had a right ankle x-ray -- IMPRESSION:1. No fracture, bone lesion or evidence of osteomyelitis. 2. Lateral soft tissue swelling with a soft tissue ulcer. she has not yet seen the vascular surgeon for review 08/31/17 on evaluation today patient's wound appears to be showing signs of improvement. She still with her appointment with vascular in order to review her results of her vascular study and then determine if any intervention would be recommended at that time. No fevers, chills, nausea, or vomiting noted at this time. She has been tolerating the dressing changes without complication. 09/28/17 on evaluation today patient's wound appears to show signs of good improvement in regard to the granulation tissue which is surfacing. There is still a layer of slough covering the wound and the posterior portion is still significantly deeper than the anterior nonetheless there has been some  good sign of things moving towards the better. She is going to go back to Dr. dew for reevaluation to ensure her blood flow is still appropriate. That will be before her next evaluation with [place] next week. No fevers, chills, nausea, or vomiting noted at this time. Patient does have some discomfort rated to be a 3-4/10 depending on activity specifically cleansing the wound makes it worse. 10/05/2017 -- the patient was seen by Dr. Lucky Cowboy last week and noninvasive studies showed a normal right ABI with brisk triphasic waveforms consistent with no arterial insufficiency including normal digital pressures. The duplex showed a patent distal right SFA stent and the proximal SFA was also  normal. He was pleased with her test and thought she should have enough of perfusion for normal wound healing. He would see her back in 6 months time. 12/21/17 on evaluation today patient appears to be doing fairly well in regard to her right lateral ankle wound. Unfortunately the main issue that she is expansion at this point is that she is having some issues with what appears to be some cellulitis in the QUINCIE, HAROON. ([id number]) right anterior shin. She has also been noting a little bit of uncomfortable feeling especially last night and her ankle area. I'm afraid that she made the developing a little bit of an infection. With that being said I think it is in the early stages. 12/28/17 on evaluation today patient's ankle appears to be doing excellent. She's making good progress at this point the cellulitis seems to have improved after last week's evaluation. Overall she is having no significant discomfort which is excellent news. She does have an appointment with Dr. dew on March 29, 2018 for reevaluation in regard to the stent he placed. She seems to have excellent blood flow in the right lower extremity. 01/19/12 on evaluation today patient's wound appears to be doing very well. In fact she does not appear to  require debridement at this point, there's no evidence of infection, and overall from the standpoint of the wound she seems to be doing very well. With that being said I believe that it may be time to switch to different dressing away from the [facility] Dressing she tells me she does have a lot going on her friend actually passed away yesterday and she's also having a lot of issues with her husband this obviously is weighing heavy on her as far as your thoughts and concerns today. 01/25/18 on evaluation today patient appears to be doing fairly well in regard to her right lateral malleolus. She has been tolerating the dressing changes without complication. Overall I feel like this is definitely showing signs of improvement as far as how the overall appearance of the wound is there's also evidence of epithelium start to migrate over the granulation tissue. In general I think that she is progressing nicely as far as the wound is concerned. The only concern she really has is whether or not we can switch to every other week visits in order to avoid having as many appointments as her daughters have a difficult time getting her to her appointments as well as the patient's husband to his he is not doing very well at this point. 02/22/18 on evaluation today patient's right lateral malleolus ulcer appears to be doing great. She has been tolerating the dressing changes without complication. Overall you making excellent progress at this time. Patient is having no significant discomfort. 03/15/18 on evaluation today patient appears to be doing much more poorly in regard to her right lateral ankle ulcer at this point. Unfortunately since have last seen her her husband has passed just a few days ago is obviously weighed heavily on her her daughter also had surgery well she is with her today as usual. There does not appear to be any evidence of infection she does seem to have significant contusion/deep tissue  injury to the right lateral malleolus which was not noted previous when I saw her last. It's hard to tell of exactly when this injury occurred although during the time she was spending the night in the hospital this may have been most likely. 03/22/18 on evaluation today patient appears  to actually be doing very well in regard to her ulcer. She did unfortunately have a setback which was noted last week however the good news is we seem to be getting back on track and in fact the wound in the core did still have some necrotic tissue which will be addressed at this point today but in general I'm seeing signs that things are on the up and up. She is glad to hear this obviously she's been somewhat concerned that due to the how her wound digressed more recently. 03/29/18 on evaluation today patient appears to be doing fairly well in regard to her right lower extremity lateral malleolus ulcer. She unfortunately does have a new area of pressure injury over the inferior portion where the wound has opened up a little bit larger secondary to the pressure she seems to be getting. She does tell me sometimes when she sleeps at night that it actually hurts and does seem to be pushing on the area little bit more unfortunately. There does not appear to be any evidence of infection which is good news. She has been tolerating the dressing changes without complication. She also did have some bruising in the left second and third toes due to the fact that she may have bump this or injured it although she has neuropathy so she does not feel she did move recently that may have been where this came from. Nonetheless there does not appear to be any evidence of infection at this time. 04/12/18 on evaluation today patient's wound on the right lateral ankle actually appears to be doing a little bit better with a lot of necrotic docking tissue centrally loosening up in clearing away. However she does have the beginnings of a deep  tissue injury on the left lateral malleolus likely due to the fact we've been trying offload the right as much as we have. I think she may benefit from an assistive soft device to help with offloading and it looks like they're looking at one of the doughnut conditions that wraps around the lower leg to offload which I think will definitely do a good job. With that being said I think we definitely need to address this issue on the left before it becomes a wound. Patient is not having significant pain. 04/19/18 on evaluation today patient appears to be doing excellent in regard to the progress she's made with her right lateral ankle ulcer. The left ankle region which did show evidence of a deep tissue injury seems to be resolving there's little fluid noted underneath and a blister there's nothing open at this point in time overall I feel like this is progressing nicely which is good news. She does not seem to be having significant discomfort at this point which is also good news. 04/25/18-She is here in follow up evaluation for bilateral lateral malleolar ulcers. The right lateral malleolus ulcer with pale subcutaneous tissue exposure, central area of ulcer with tendon/periosteum exposed. The left lateral malleolus ulcer now with Narang, [name] ([id number]) central area of nonviable tissue, otherwise deep tissue injury. She is wearing compression wraps to the left lower extremity, she will place the right lower extremity compression wraps on when she gets home. She will be out of town over the weekend and return next week and follow-up appointment. She completed her doxycycline this morning 05/03/18 on evaluation today patient appears to be doing very well in regard to her right lateral ankle ulcer in general. At least she's showing some signs  of improvement in this regard. Unfortunately she has some additional injury to the left lateral malleolus region which appears to be new likely even over the  past several days. Again this determination is based on the overall appearance. With that being said the patient is obviously frustrated about this currently. 05/10/18-She is here in follow-up evaluation for bilateral lateral malleolar ulcers. She states she has purchased offloading shoes/boots and they will arrive tomorrow. She was asked to bring them in the office at next week's appointment so her provider is aware of product being utilized. She continues to sleep on right or left side, she has been encouraged to sleep on her back. The right lateral malleolus ulcer is precariously close to peri-osteum; will order xray. The left lateral malleolus ulcer is improved. Will switch back to santyl; she will follow up next week. 05/17/18 on evaluation today patient actually appears to be doing very well in regard to her malleolus her ulcers compared to last time I saw them. She does not seem to have as much in the way of contusion at this point which is great news. With that being said she does continue to have discomfort and I do believe that she is still continuing to benefit from the offloading/pressure reducing boots that were recommended. I think this is the key to trying to get this to heal up completely. 05/24/18 on evaluation today patient actually appears to be doing worse at this point in time unfortunately compared to her last week's evaluation. She is having really no increased pain which is good news unfortunately she does have more maceration in your theme and noted surrounding the right lateral ankle the left lateral ankle is not really is erythematous I do not see signs of the overt cellulitis on that side. Unfortunately the wounds do not seem to have shown any signs of improvement since the last evaluation. She also has significant swelling especially on the right compared to previous some of this may be due to infection however also think that she may be served better while she has these  wounds by compression wrapping versus continuing to use the Juxta-Lite for the time being. Especially with the amount of drainage that she is experiencing at this point. No fevers, chills, nausea, or vomiting noted at this time. 05/31/18 on evaluation today patient appears to actually be doing better in regard to her right lateral lower extremity ulcer specifically on the malleolus region. She has been tolerating the antibiotic without complication. With that being said she still continues to have issues but a little bit of redness although nothing like she what she was experiencing previous. She still continues to pressure to her ankle area she did get the problem on offloading boots unfortunately she will not wear them she states there too uncomfortable and she can't get in and out of the bed. Nonetheless at this point her wounds seem to be continually getting worse which is not what we want I'm getting somewhat concerned about her progress and how things are going to proceed if we do not intervene in some way shape or form. I therefore had a very lengthy conversation today about offloading yet again and even made a specific suggestion for switching her to a memory foam mattress and even gave the information for a specific one that they could look at getting if it was something that they were interested in considering. She does not want to be considered for a hospital bed air mattress although honestly insurance  would not cover it that she does not have any wounds on her trunk. 06/14/18 on evaluation today both wounds over the bilateral lateral malleolus her ulcers appear to be doing better there's no evidence of pressure injury at this point. She did get the foam mattress for her bed and this does seem to have been extremely beneficial for her in my pinion. Her daughter states that she is having difficulty getting out of bed because of how soft it is. The patient also relates this to be.  Nonetheless I do feel like she's actually doing better. Unfortunately right after and around the time she was getting the mattress she also sustained a fall when she got up to go pick up the phone and ended up injuring her right elbow she has 18 sutures in place. We are not caring for this currently although home health is going to be taking the sutures out shortly. Nonetheless this may be something that we need to evaluate going forward. It depends on how well it has or has not healed in the end. She also recently saw an orthopedic specialist for an injection in the right shoulder just before her fall unfortunately the fall seems to have worsened her pain. 06/21/18 on evaluation today patient appears to be doing about the same in regard to her lateral malleolus ulcers. Both appear to be just a little bit deeper but again we are clinging away the necrotic and dead tissue which I think is why this is progressing towards a deeper realm as opposed to improving from my measurement standpoint in that regard. Nonetheless she has been tolerating the dressing changes she absolutely hates the memory foam mattress topper that was obtained for her nonetheless I do believe this is still doing excellent as far as taking care of excess pressure in regard to the lateral malleolus regions. She in fact has no pressure injury that I see whereas in weeks past it was week by week I was constantly seeing new pressure injuries. Overall I think it has been very beneficial for her. 07/03/18; patient arrives in my clinic today. She has deep punched out areas over her bilateral lateral malleoli. The area on the right has some more depth. KENIESHA, ADDERLY ([id number]) We spent a lot of time today talking about pressure relief for these areas. This started when her daughter asked for a prescription for a memory foam mattress. I have never written a prescription for a mattress and I don't think insurances would pay for that on  an ordinary bed. In any case he came up that she has foam boots that she refuses to wear. I would suggest going to these before any other offloading issues when she is in bed. They say she is meticulous about offloading this the rest of the day 07/10/18- She is seen in follow-up evaluation for bilateral, lateral malleolus ulcers. There is no improvement in the ulcers. She has purchased and is sleeping on a memory foam mattress/overlay, she has been using the offloading boots nightly over the past week. She has a follow up appointment with vascular medicine at the end of October, in my opinion this follow up should be expedited given her deterioration and suboptimal TBI results. We will order plain film xray of the left ankle as deeper structures are palpable; would consider having MRI, regardless of xray report(s). The ulcers will be treated with iodoflex/iodosorb, she is unable to safely change the dressings daily with santyl. 07/19/18 on evaluation today patient appears  to be doing in general visually well in regard to her bilateral lateral malleolus ulcers. She has been tolerating the dressing changes without complication which is good news. With that being said we did have an x-ray performed on 07/12/18 which revealed a slight loosen see in the lateral portion of the distal left fibula which may represent artifact but underline lytic destruction or osteomyelitis could not be excluded. MRI was recommended. With that being said we can see about getting the patient scheduled for an MRI to further evaluate this area. In fact we have that scheduled currently for August 20 19,019. 07/26/18 on evaluation today patient's wound on the right lateral ankle actually appears to be doing fairly well at this point in my pinion. She has made some good progress currently. With that being said unfortunately in regard to the left lateral ankle ulcer this seems to be a little bit more problematic at this time. In fact as  I further evaluated the situation she actually had bone exposed which is the first time that's been the case in the bone appear to be necrotic. Currently I did review patient's note from Dr. Bunnie Domino office with [facility]. He stated that ABI was 1.26 on the right and 0.95 on the left with good waveforms. Her perfusion is stable not reduced from previous studies and her digital waveforms were pretty good particularly on the right. His conclusion upon review of the note was that there was not much she could do to improve her perfusion and he felt she was adequate for wound healing. His suggestion was that she continued to see [place] and consider a synthetic skin graft if there was no underlying infection. He plans to see her back in six months or as needed. 08/01/18 on evaluation today patient appears to be doing better in regard to her right lateral ankle ulcer. Her left lateral ankle ulcer is about the same she still has bone involvement in evidence of necrosis. There does not appear to be evidence of infection at this time On the right lateral lower extremity. I have started her on the Augmentin she picked this up and started this yesterday. This is to get her through until she sees infectious disease which is scheduled for 08/12/18. 08/06/18 on evaluation today patient appears to be doing rather well considering my discussion with patient's daughter at the end of last week. The area which was marked where she had erythema seems to be improved and this is good news. With that being said overall the patient seems to be making good improvement when it comes to the overall appearance of the right lateral ankle ulcer although this has been slow she at least is coming around in this regard. Unfortunately in regard to the left lateral ankle ulcer this is osteomyelitis based on the pathology report as well is bone culture. Nonetheless we are still waiting CT scan. Unfortunately the MRI we  originally ordered cannot be performed as the patient is a pacemaker which I had overlooked. Nonetheless we are working on the CT scan approval and scheduling as of now. She did go to the hospital over the weekend and was placed on IV Cefzo for a couple of days. Fortunately this seems to have improved the erythema quite significantly which is good news. There does not appear to be any evidence of worsening infection at this time. She did have some bleeding after the last debridement therefore I did not perform any sharp debridement in regard  to left lateral ankle at this point. Patient has been approved for a snap vac for the right lateral ankle. 08/14/18; the patient with wounds over her bilateral lateral malleoli. The area on the right actually looks quite good. Been using a snap back on this area. Healthy granulation and appears to be filling in. Unfortunately the area on the left is really problematic. 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 Also noted to have cellulitis. She saw Dr. Novella Olive of infectious disease today and was put on linezolid. We are able to verify this with her pharmacy. She is completed the Augmentin that she was already on. We've been using Iodoflex to this area 08/23/18 on evaluation today patient's wounds both actually appear to be doing better compared to my prior evaluations. Fortunately she showing signs of good improvement in regard to the overall wound status especially where were using the snap vac on the right. In regard to left lateral malleolus the wound bed actually appears to be much cleaner than previously noted. I do not feel any phone directly probed during evaluation today and though there is tendon noted this does not appear Franzel, [name] ([id number]) to be necrotic it's actually fairly good as far as the overall appearance of the tendon is concerned. In general the wound  bed actually appears to be doing significantly better than it was previous. Patient is currently in the care of [name] and I did review that note today. He actually has her on two weeks of linezolid and then following the patient will be on 1-2 months of Keflex. That is the plan currently. She has been on antibiotics therapy as prescribed by myself initially starting on July 30, 2018 and has been on that continuously up to this point. 08/30/18 on evaluation today patient actually appears to be doing much better in regard to her right lateral malleolus ulcer. She has been tolerating the dressing changes specifically the snap vac without complication although she did have some issues with the seal currently. Apparently there was some trouble with getting it to maintain over the past week past Sunday. Nonetheless overall the wound appears better in regard to the right lateral malleolus region. In regard to left lateral malleolus this actually show some signs of additional granulation although there still tendon noted in the base of the wound this appears to be healthy not necrotic in any way whatsoever. We are considering potentially using a snap vac for the left lateral malleolus as well the product wrap from [employer], [place], was present in the clinic today we're going to see this patient I did have her come in with me after obtaining consent from the patient and her daughter in order to look at the wound and see if there's any recommendation one way or another as to whether or not they felt the snapback could be beneficial for the left lateral malleolus region. But the conclusion was that it might be but that this is definitely a little bit deeper wound than what traditionally would be utilized for a snap vac. 09/06/18 on evaluation today patient actually appears to be doing excellent in my pinion in regard to both ankle ulcers. She has been tolerating the dressing changes without complication which  is great news. Specifically we have been using the snap vac. In regard to the right ankle I'm not even sure that this is going to be necessary for today  and following as the wound has filled in quite nicely. In regard to the left ankle I do believe that we're seeing excellent epithelialization from the edge as well as granulation in the central portion the tendon is still exposed but there's no evidence of necrotic bone and in general I feel like the patient has made excellent progress even compared to last week with just one week of the snap vac. 09/11/18; this is a patient who has wounds on her bilateral lateral malleoli. Initially both of these were deep stage IV wounds in the setting of chronic arterial insufficiency. She has been revascularized. As I understand think she been using snap vacs to both of these wounds however the area on the right became more superficial and currently she is only using it on the left. Using silver collagen on the right and silver collagen under the back on the left I believe 09/19/18 on evaluation today patient actually appears to be doing very well in regard to her lateral malleolus or ulcers bilaterally. She has been tolerating the dressing changes without complication. Fortunately there does not appear to be any evidence of infection at this time. Overall I feel like she is improving in an excellent manner and I'm very pleased with the fact that everything seems to be turning towards the better for her. This has obviously been a long road. 09/27/18 on evaluation today patient actually appears to be doing very well in regard to her bilateral lateral malleolus ulcers. She has been tolerating the dressing changes without complication. Fortunately there does not appear to be any evidence of infection at this time which is also great news. No fevers, chills, nausea, or vomiting noted at this time. Overall I feel like she is doing excellent with the snap vac on the  left malleolus. She had 40 mL of fluid collection over the past week. 10/04/18 on evaluation today patient actually appears to be doing well in regard to her bilateral lateral malleolus ulcers. She continues to tolerate the dressing changes without complication. One issue that I see is the snap vac on the left lateral malleolus which appears to have sealed off some fluid underlying this area and has not really allowed it to heal to the degree that I would like to see. For that reason I did suggest at this point we may want to pack a small piece of packing strip into this region to allow it to more effectively wick out fluid. 10/11/18 in general the patient today does not feel that she has been doing very well. She's been a little bit lethargic and subsequently is having bodyaches as well according to what she tells me today. With that being said overall she has been concerned with the fact that something may be worsening although to be honest her wounds really have not been appearing poorly. She does have a new ulcer on her left heel unfortunately. This may be pressure related. Nonetheless it seems to me to have potentially started at least as a blister I do not see any evidence of deep tissue injury. In regard to the left ankle the snap vac still seems to be causing the ceiling off of the deeper part of the wound which is in turn trapping fluid. I'm not extremely pleased with the overall appearance as far as progress from last week to this week therefore I'm gonna discontinue the snap vac at this point. 10/18/18 patient unfortunately this point has not been feeling well for the past several  days. She was seen by Grayland Ormond her primary care provider who is a Librarian, academic at [facility]. Subsequently she states that she's been very weak and generally feeling malaise. No fevers, chills, nausea, or vomiting noted at this time. With that being said bloodwork was performed at the PCP office  on the 11th of this month which showed a white blood cell count of 10.7. This was repeated today [name], [name] ([id number]) and shows a white blood cell count of 12.4. This does show signs of worsening. Coupled with the fact that she is feeling worse and that her left ankle wound is not really showing signs of improvement I feel like this is an indication that the osteomyelitis is likely exacerbating not improving. Overall I think we may also want to check her C-reactive protein and sedimentation rate. Actually did call [facility] office this afternoon while the patient was in the office here with me. Subsequently based on the findings we discussed treatment possibilities and I think that it is appropriate for [place] to go ahead and initiate treatment with doxycycline which I'm going to do. Subsequently he did agree to see about adding a CRP and sedimentation rate to her orders. If that has not already been drawn to where they can run it they will contact the patient she can come back to have that check. They are in agreement with plan as far as the patient and her daughter are concerned. Nonetheless also think we need to get in touch with Dr. Henreitta Leber office to see about getting the patient scheduled with him as soon as possible. 11/08/18 on evaluation today patient presents for follow-up concerning her bilateral foot and ankle ulcers. I did do an extensive review of her chart in epic today. Subsequently she was seen by [name] he did initiate Cefepime IV antibiotic therapy. Subsequently she had some issues with her PICC line this had to be removed because it was coiled and then replaced. Fortunately that was now settled. Unfortunately she has continued have issues with her left heel as well as the issues that she is experiencing with her bilateral lateral malleolus regions. I do believe however both areas seem to be doing a little bit better on evaluation today which is good news. No  fevers, chills, nausea, or vomiting noted at this time. She actually has an angiogram schedule with Dr. dew on this coming Monday, November 11, 2018. Subsequently the patient states that she is feeling much better especially than what she was roughly 2 weeks ago. She actually had to cancel an appointment because she was feeling so poorly. No fevers, chills, nausea, or vomiting noted at this time. 11/15/18 on evaluation today patient actually is status post having had her angiogram with Dr. dew Monday, four days ago. 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 He had to go and work on several areas of the vasculature fortunately he was able to obtain no more than a 30% residual stenosis throughout post procedure. I reviewed this note today. I think this will definitely help with healing at this time. Fortunately there does not appear to be any signs of infection and I do feel like ratio already has a better appearance to it. 11/22/18 upon evaluation today patient actually appears to be doing very well in regard to her wounds in general. The right lateral malleolus looks excellent the heel looks better in the left lateral malleolus also appears to be doing a  little better. With that being said the right second toe actually appears to be open and training we been watching this is been dry and stable but now is open. Electronic Signature(s) Signed: 11/23/2018 12:35:32 AM By: Worthy Keeler PA-C Entered By: Worthy Keeler on 11/23/2018 00:18:05 [name] ([id number]) -------------------------------------------------------------------------------- Physical Exam Details Patient Name: [name], [name] Date of Service: 11/22/2018 3:00 PM Medical Record Number: [id number] Patient Account Number: [id number] Date of Birth/Sex: Aug 01, 1923 (84 y.o. F) Treating RN: Montey Hora Primary Care Provider: Grayland Ormond Other Clinician: Referring Provider: Grayland Ormond Treating Provider/Extender: STONE III, HOYT Weeks in Treatment: 68 Constitutional Well-nourished and well-hydrated in no acute distress. Respiratory normal breathing without difficulty. clear to auscultation bilaterally. Cardiovascular regular rate and rhythm with normal S1, S2. Psychiatric this patient is able to make decisions and demonstrates good insight into disease process. Alert and Oriented x 3. pleasant and cooperative. Notes I did perform sharp debridement of the hill ulcer as well is the right second toe ulcer. Post debridement both locations appear to be doing significantly better which is great news. Electronic Signature(s) Signed: 11/23/2018 12:35:32 AM By: Worthy Keeler PA-C Entered By: Worthy Keeler on 11/23/2018 00:18:42 [name] ([id number]) -------------------------------------------------------------------------------- Physician Orders Details Patient Name: [name] Date of Service: 11/22/2018 3:00 PM Medical Record Number: [id number] Patient Account Number: [id number] Date of Birth/Sex: [birth year] (84 y.o. F) Treating RN: Harold Barban Primary Care Provider: Grayland Ormond Other Clinician: Referring Provider: Grayland Ormond Treating Provider/Extender: Melburn Hake, HOYT Weeks in Treatment: 22 Verbal / Phone Orders: No Diagnosis Coding ICD-10 Coding Code Description 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, stage 4 L89.524 Pressure ulcer of left ankle, stage 4 S90.822A Blister (nonthermal), left foot, initial encounter B76.283 Pressure ulcer of left heel, stage 2 I70.233 Atherosclerosis of native arteries of right leg with ulceration of ankle I70.243 Atherosclerosis of native arteries of left leg with ulceration of ankle I89.0 Lymphedema, not elsewhere classified B35.4 Tinea corporis 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 o Cleanse wound with mild soap and water o May Shower, gently pat wound dry prior to applying new dressing. Wound #2 Left,Lateral Malleolus o Clean wound with Normal Saline. o Cleanse wound with mild soap and water o May Shower, gently pat wound dry prior to applying new dressing. Wound #3 Left Calcaneus o Clean wound with Normal Saline. o Cleanse wound with mild soap and water o May Shower, gently pat wound dry prior to applying new dressing. Wound #4 Distal Toe Second o Clean wound with Normal Saline. o Cleanse wound with mild soap and water o May Shower, gently pat wound dry prior to applying new dressing. Anesthetic (add to Medication List) Wound #1 Right,Lateral Malleolus o Topical Lidocaine 4% cream applied to wound bed prior to debridement (In Clinic Only). Wound #2 Left,Lateral Malleolus o Topical Lidocaine 4% cream applied to wound bed prior to debridement (In Clinic Only). Wound #3 Left Calcaneus o Topical Lidocaine 4% cream applied to wound bed prior to debridement (In Clinic Only). KARILYNN, CARRANZA ([id number]) Wound #4 Distal Toe Second o Topical Lidocaine 4% cream applied to wound bed prior to debridement (In Clinic Only). Primary Wound Dressing Wound #1 Right,Lateral Malleolus o Silver Collagen Wound #3 Left Calcaneus o Silver Collagen Wound #4 Distal Toe Second o Silver Collagen Wound #2 Left,Lateral Malleolus o Silver Alginate Secondary Dressing Wound #1 Right,Lateral Malleolus o Boardered Foam Dressing  Wound #2 Left,Lateral Malleolus o Boardered Foam Dressing Wound #3 Left Calcaneus o Boardered Foam Dressing Wound #4 Distal Toe Second o Conform/Kerlix Dressing Change Frequency Wound #1 Right,Lateral Malleolus o Change Dressing Monday, Wednesday, Friday Wound #2 Left,Lateral Malleolus o Change Dressing Monday, Wednesday, Friday Wound #3 Left Calcaneus o Change Dressing Monday, Wednesday,  Friday Wound #4 Distal Toe Second o Change Dressing Monday, Wednesday, Friday Follow-up Appointments Wound #1 Right,Lateral Malleolus o Return Appointment in 2 weeks. - Return 12-03-2018 Wound #2 Left,Lateral Malleolus o Return Appointment in 2 weeks. - Return 12-03-2018 Wound #3 Left Calcaneus o Return Appointment in 2 weeks. - Return 12-03-2018 Wound #4 Distal Toe Second o Return Appointment in 2 weeks. - Return 12-03-2018 DUSTI, TETRO ([id number]) Edema Control Wound #1 Right,Lateral Malleolus o Patient to wear own compression stockings o Patient to wear own Velcro compression garment. o Elevate legs to the level of the heart and pump ankles as often as possible Wound #2 Left,Lateral Malleolus o Patient to wear own compression stockings o Patient to wear own Velcro compression garment. o Elevate legs to the level of the heart and pump ankles as often as possible Wound #3 Left Calcaneus o Patient to wear own compression stockings o Patient to wear own Velcro compression garment. o Elevate legs to the level of the heart and pump ankles as often as possible Wound #4 Distal Toe Second o Patient to wear own compression stockings o Patient to wear own Velcro compression garment. o Elevate legs to the level of the heart and pump ankles as often as possible Off-Loading Wound #1 Right,Lateral Malleolus o Turn and reposition every 2 hours o Other: - do not put pressure on your ankles Wound #2 Left,Lateral Malleolus o Turn and reposition every 2 hours o Other: - do not put pressure on your ankles Wound #3 Left Calcaneus o Turn and reposition every 2 hours o Other: - do not put pressure on your ankles Wound #4 Distal Toe Second o Turn and reposition every 2 hours o Other: - do not put pressure on your ankles Additional Orders / Instructions Wound #1 Right,Lateral Malleolus o Increase protein intake. o Increase protein  intake. Wound #2 Left,Lateral Malleolus o Increase protein intake. o Increase protein intake. Wound #3 Left Calcaneus o Increase protein intake. o Increase protein intake. Wound #4 Distal Toe Second o Increase protein intake. o Increase protein intake. OAKLEE, ESTHER ([id number]) [place] Wound #1 [place] Visits - [facility]. Patient will return 12-31. Please visit patient Monday, Wednesday, and Friday o Home Health Nurse may visit PRN to address patientos wound care needs. o FACE TO FACE ENCOUNTER: MEDICARE and MEDICAID PATIENTS: I certify that this patient is under my care and that I had a face-to-face encounter that meets the physician face-to-face encounter requirements with this patient on this date. The encounter with the patient was in whole or in part for the following MEDICAL CONDITION: (primary reason for [place]) MEDICAL NECESSITY: I certify, that based on my findings, NURSING services are a medically necessary home health service. HOME BOUND STATUS: I certify that my clinical findings support that this patient is homebound (i.e., Due to illness or injury, pt requires aid of supportive devices such as crutches, cane, wheelchairs, walkers, the use of special transportation or the assistance of another person to leave their place of residence. There is a normal inability to leave the home and doing so requires considerable and taxing effort. Other absences are for medical  reasons / religious services and are infrequent or of short duration when for other reasons). o If current dressing causes regression in wound condition, may D/C ordered dressing product/s and apply Normal Saline Moist Dressing daily until next [place] / Other MD appointment. [place] of regression in wound condition at [phone]. o Please direct any NON-WOUND related issues/requests for orders to  patient's Primary Care Physician Wound #2 [place] Visits - [facility]. Patient will return 12-31. Please visit patient Monday, Wednesday, and Friday o Home Health Nurse may visit PRN to address patientos wound care needs. o FACE TO FACE ENCOUNTER: MEDICARE and MEDICAID PATIENTS: I certify that this patient is under my care and that I had a face-to-face encounter that meets the physician face-to-face encounter requirements with this patient on this date. The encounter with the patient was in whole or in part for the following MEDICAL CONDITION: (primary reason for [place]) MEDICAL NECESSITY: I certify, that based on my findings, NURSING services are a medically necessary home health service. HOME BOUND STATUS: I certify that my clinical findings support that this patient is homebound (i.e., Due to illness or injury, pt requires aid of supportive devices such as crutches, cane, wheelchairs, walkers, the use of special transportation or the assistance of another person to leave their place of residence. There is a normal inability to leave the home and doing so requires considerable and taxing effort. Other absences are for medical reasons / religious services and are infrequent or of short duration when for other reasons). o If current dressing causes regression in wound condition, may D/C ordered dressing product/s and apply Normal Saline Moist Dressing daily until next [place] / Other MD appointment. [place] of regression in wound condition at [phone]. o Please direct any NON-WOUND related issues/requests for orders to patient's Primary Care Physician Wound #3 Left [place] Visits - [facility]. Patient will return 12-31. Please visit patient Monday, Wednesday, and Friday o Home Health Nurse may visit PRN to address patientos wound care needs. o FACE TO FACE ENCOUNTER:  MEDICARE and MEDICAID PATIENTS: I certify that this patient is under my care and that I had a face-to-face encounter that meets the physician face-to-face encounter requirements with this patient on this date. The encounter with the patient was in whole or in part for the following MEDICAL CONDITION: (primary reason for [place]) MEDICAL NECESSITY: I certify, that based on my findings, NURSING services are a medically necessary home health service. HOME BOUND STATUS: I certify that my clinical findings support that this patient is homebound (i.e., Due to illness or injury, pt requires aid of supportive devices such as crutches, cane, wheelchairs, walkers, the use of special transportation or the assistance of another person to leave their place of residence. There is a normal inability to leave the home and doing so requires considerable and taxing effort. Other absences are for medical reasons / religious services and are infrequent or of short duration when for other reasons). o If current dressing causes regression in wound condition, may D/C ordered dressing product/s and apply Normal Saline Moist Dressing daily until next [place] / Other MD appointment. [place] of regression in wound condition at [phone]. o Please direct any NON-WOUND related issues/requests for orders to patient's Primary Care Physician Wound #4 Distal Toe Second SOUNDRA, LAMPLEY ([id number]) o [place] [facility] - [facility]. Patient will return 12-31.  Please visit patient Monday, Wednesday, and Friday o Home Health Nurse may visit PRN to address patientos wound care needs. o FACE TO FACE ENCOUNTER: MEDICARE and MEDICAID PATIENTS: I certify that this patient is under my care and that I had a face-to-face encounter that meets the physician face-to-face encounter requirements with this patient on this date. The encounter with the patient was in whole or in part  for the following MEDICAL CONDITION: (primary reason for [place]) MEDICAL NECESSITY: I certify, that based on my findings, NURSING services are a medically necessary home health service. HOME BOUND STATUS: I certify that my clinical findings support that this patient is homebound (i.e., Due to illness or injury, pt requires aid of supportive devices such as crutches, cane, wheelchairs, walkers, the use of special transportation or the assistance of another person to leave their place of residence. There is a normal inability to leave the home and doing so requires considerable and taxing effort. Other absences are for medical reasons / religious services and are infrequent or of short duration when for other reasons). o If current dressing causes regression in wound condition, may D/C ordered dressing product/s and apply Normal Saline Moist Dressing daily until next [place] / Other MD appointment. [place] of regression in wound condition at [phone]. o Please direct any NON-WOUND related issues/requests for orders to patient's Primary Care Physician Electronic Signature(s) Signed: 11/22/2018 4:41:19 PM By: Gretta Cool, BSN, RN, CWS, Kim RN, BSN Signed: 11/23/2018 12:35:32 AM By: Worthy Keeler PA-C Entered By: Gretta Cool, BSN, RN, CWS, Kim on 11/22/2018 16:38:35 [name], [name] ([id number]) -------------------------------------------------------------------------------- Problem List Details Patient Name: MIANA, POLITTE Date of Service: 11/22/2018 3:00 PM Medical Record Number: [id number] Patient Account Number: [id number] Date of Birth/Sex: Apr 10, 1923 (84 y.o. 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 08/10/2017 No Yes ankle I70.243 Atherosclerosis of native arteries of left leg with ulceration of 07/10/2018 No Yes ankle I89.0 Lymphedema, not elsewhere classified 08/10/2017 No Yes B35.4 Tinea corporis 09/28/2017 No Yes 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, left ankle and foot 08/14/2018 No Yes Inactive Problems Resolved Problems HETTIE, ROSELLI ([id number]) ICD-10 Code Description Active Date Resolved Date S90.822A Blister (nonthermal), left foot, initial encounter 10/11/2018 10/11/2018 Electronic Signature(s) Signed: 11/23/2018 12:35:32 AM By: Worthy Keeler PA-C Entered By: Worthy Keeler on 11/23/2018 00:20:54 [name], [name] ([id number]) -------------------------------------------------------------------------------- Progress Note Details Patient Name: [name] Date of Service: 11/22/2018 3:00 PM Medical Record Number: [id number] Patient Account Number: [id number] Date of Birth/Sex: [birth year] (84 y.o. 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 Dr. Amalia Hailey the podiatrist  saw her on 07/20/2017 and sent her to the wound center  for possible hyperbaric oxygen therapy. past medical history of peripheral vascular disease, varicose veins, status post appendectomy, basal cell carcinoma excision from the left leg, cholecystectomy, pacemaker placement, right lower extremity angiography done by Dr. dew in March 2017 with placement of a stent. there is also note of a successful ablation of the right small saphenous vein done which was reviewed by ultrasound on 10/24/2016. the patient had a right small saphenous vein ablation done on 10/20/2016. The patient has never been a smoker. She has been seen by Dr. Corene Cornea dew the vascular surgeon who most recently saw her on 06/15/2017 for evaluation of ongoing problems with right leg swelling. She had a lower extremity arterial duplex examination done(02/13/17) which showed patent distal right superficial femoral artery stent and above-the-knee popliteal stent without evidence of restenosis. The ABI was more than 1.3 on the right and more than 1.3 on the left. This was consistent with noncompressible arteries due to medial calcification. The right great toe pressure and PPG waveforms are within normal limits and the left great toe pressure and PPG waveforms are decreased. he recommended she continue to wear her compression stockings and continue with elevation. She is scheduled to have a noninvasive arterial study in the near future 08/16/2017 -- had a lower extremity arterial duplex examination done which showed patent distal right superficial femoral artery stent and above-the-knee popliteal stent without evidence of restenosis. The ABI was more than 1.3 on the right and more than 1.3 on the left. This was consistent with noncompressible arteries due to medial calcification. The right great toe pressure and PPG waveforms are within normal limits and the left great toe pressure and PPG waveforms are decreased. the x-ray of the right ankle has not yet been done 08/24/2017 -- had a right  ankle x-ray -- IMPRESSION:1. No fracture, bone lesion or evidence of osteomyelitis. 2. Lateral soft tissue swelling with a soft tissue ulcer. she has not yet seen the vascular surgeon for review 08/31/17 on evaluation today patient's wound appears to be showing signs of improvement. She still with her appointment with vascular in order to review her results of her vascular study and then determine if any intervention would be recommended at that time. No fevers, chills, nausea, or vomiting noted at this time. She has been tolerating the dressing changes without complication. 09/28/17 on evaluation today patient's wound appears to show signs of good improvement in regard to the granulation tissue which is surfacing. There is still a layer of slough covering the wound and the posterior portion is still significantly deeper than the anterior nonetheless there has been some good sign of things moving towards the better. She is going to go back to Dr. dew for reevaluation to ensure her blood flow is still appropriate. That will be before her next evaluation with [place] next week. No fevers, chills, nausea, or vomiting noted at this time. Patient does have some discomfort rated to be a 3-4/10 depending on activity specifically cleansing the wound makes it worse. 10/05/2017 -- the patient was seen by Dr. Lucky Cowboy last week and noninvasive studies showed a normal right ABI with brisk ARTHELLA, HEADINGS. ([id number]) triphasic waveforms consistent with no arterial insufficiency including normal digital pressures. The duplex showed a patent distal right SFA stent and the proximal SFA was also normal. He was pleased with her test and thought she should have enough of perfusion for normal wound healing. He would see  her back in 6 months time. 12/21/17 on evaluation today patient appears to be doing fairly well in regard to her right lateral ankle wound. Unfortunately the main issue that she is expansion at this point is  that she is having some issues with what appears to be some cellulitis in the right anterior shin. She has also been noting a little bit of uncomfortable feeling especially last night and her ankle area. I'm afraid that she made the developing a little bit of an infection. With that being said I think it is in the early stages. 12/28/17 on evaluation today patient's ankle appears to be doing excellent. She's making good progress at this point the cellulitis seems to have improved after last week's evaluation. Overall she is having no significant discomfort which is excellent news. She does have an appointment with Dr. dew on March 29, 2018 for reevaluation in regard to the stent he placed. She seems to have excellent blood flow in the right lower extremity. 01/19/12 on evaluation today patient's wound appears to be doing very well. In fact she does not appear to require debridement at this point, there's no evidence of infection, and overall from the standpoint of the wound she seems to be doing very well. With that being said I believe that it may be time to switch to different dressing away from the [facility] Dressing she tells me she does have a lot going on her friend actually passed away yesterday and she's also having a lot of issues with her husband this obviously is weighing heavy on her as far as your thoughts and concerns today. 01/25/18 on evaluation today patient appears to be doing fairly well in regard to her right lateral malleolus. She has been tolerating the dressing changes without complication. Overall I feel like this is definitely showing signs of improvement as far as how the overall appearance of the wound is there's also evidence of epithelium start to migrate over the granulation tissue. In general I think that she is progressing nicely as far as the wound is concerned. The only concern she really has is whether or not we can switch to every other week visits in order to  avoid having as many appointments as her daughters have a difficult time getting her to her appointments as well as the patient's husband to his he is not doing very well at this point. 02/22/18 on evaluation today patient's right lateral malleolus ulcer appears to be doing great. She has been tolerating the dressing changes without complication. Overall you making excellent progress at this time. Patient is having no significant discomfort. 03/15/18 on evaluation today patient appears to be doing much more poorly in regard to her right lateral ankle ulcer at this point. Unfortunately since have last seen her her husband has passed just a few days ago is obviously weighed heavily on her her daughter also had surgery well she is with her today as usual. There does not appear to be any evidence of infection she does seem to have significant contusion/deep tissue injury to the right lateral malleolus which was not noted previous when I saw her last. It's hard to tell of exactly when this injury occurred although during the time she was spending the night in the hospital this may have been most likely. 03/22/18 on evaluation today patient appears to actually be doing very well in regard to her ulcer. She did unfortunately have a setback which was noted last week however the good news  is we seem to be getting back on track and in fact the wound in the core did still have some necrotic tissue which will be addressed at this point today but in general I'm seeing signs that things are on the up and up. She is glad to hear this obviously she's been somewhat concerned that due to the how her wound digressed more recently. 03/29/18 on evaluation today patient appears to be doing fairly well in regard to her right lower extremity lateral malleolus ulcer. She unfortunately does have a new area of pressure injury over the inferior portion where the wound has opened up a little bit larger secondary to the pressure  she seems to be getting. She does tell me sometimes when she sleeps at night that it actually hurts and does seem to be pushing on the area little bit more unfortunately. There does not appear to be any evidence of infection which is good news. She has been tolerating the dressing changes without complication. She also did have some bruising in the left second and third toes due to the fact that she may have bump this or injured it although she has neuropathy so she does not feel she did move recently that may have been where this came from. Nonetheless there does not appear to be any evidence of infection at this time. 04/12/18 on evaluation today patient's wound on the right lateral ankle actually appears to be doing a little bit better with a lot of necrotic docking tissue centrally loosening up in clearing away. However she does have the beginnings of a deep tissue injury on the left lateral malleolus likely due to the fact we've been trying offload the right as much as we have. I think she may benefit from an assistive soft device to help with offloading and it looks like they're looking at one of the doughnut conditions that wraps around the lower leg to offload which I think will definitely do a good job. With that being said I think we definitely need to address this issue on the left before it becomes a wound. Patient is not having significant pain. RHEA, KAELIN ([id number]) 04/19/18 on evaluation today patient appears to be doing excellent in regard to the progress she's made with her right lateral ankle ulcer. The left ankle region which did show evidence of a deep tissue injury seems to be resolving there's little fluid noted underneath and a blister there's nothing open at this point in time overall I feel like this is progressing nicely which is good news. She does not seem to be having significant discomfort at this point which is also good news. 04/25/18-She is here in follow up  evaluation for bilateral lateral malleolar ulcers. The right lateral malleolus ulcer with pale subcutaneous tissue exposure, central area of ulcer with tendon/periosteum exposed. The left lateral malleolus ulcer now with central area of nonviable tissue, otherwise deep tissue injury. She is wearing compression wraps to the left lower extremity, she will place the right lower extremity compression wraps on when she gets home. She will be out of town over the weekend and return next week and follow-up appointment. She completed her doxycycline this morning 05/03/18 on evaluation today patient appears to be doing very well in regard to her right lateral ankle ulcer in general. At least she's showing some signs of improvement in this regard. Unfortunately she has some additional injury to the left lateral malleolus region which appears to be new likely even over  the past several days. Again this determination is based on the overall appearance. With that being said the patient is obviously frustrated about this currently. 05/10/18-She is here in follow-up evaluation for bilateral lateral malleolar ulcers. She states she has purchased offloading shoes/boots and they will arrive tomorrow. She was asked to bring them in the office at next week's appointment so her provider is aware of product being utilized. She continues to sleep on right or left side, she has been encouraged to sleep on her back. The right lateral malleolus ulcer is precariously close to peri-osteum; will order xray. The left lateral malleolus ulcer is improved. Will switch back to santyl; she will follow up next week. 05/17/18 on evaluation today patient actually appears to be doing very well in regard to her malleolus her ulcers compared to last time I saw them. She does not seem to have as much in the way of contusion at this point which is great news. With that being said she does continue to have discomfort and I do believe that she is  still continuing to benefit from the offloading/pressure reducing boots that were recommended. I think this is the key to trying to get this to heal up completely. 05/24/18 on evaluation today patient actually appears to be doing worse at this point in time unfortunately compared to her last week's evaluation. She is having really no increased pain which is good news unfortunately she does have more maceration in your theme and noted surrounding the right lateral ankle the left lateral ankle is not really is erythematous I do not see signs of the overt cellulitis on that side. Unfortunately the wounds do not seem to have shown any signs of improvement since the last evaluation. She also has significant swelling especially on the right compared to previous some of this may be due to infection however also think that she may be served better while she has these wounds by compression wrapping versus continuing to use the Juxta-Lite for the time being. Especially with the amount of drainage that she is experiencing at this point. No fevers, chills, nausea, or vomiting noted at this time. 05/31/18 on evaluation today patient appears to actually be doing better in regard to her right lateral lower extremity ulcer specifically on the malleolus region. She has been tolerating the antibiotic without complication. With that being said she still continues to have issues but a little bit of redness although nothing like she what she was experiencing previous. She still continues to pressure to her ankle area she did get the problem on offloading boots unfortunately she will not wear them she states there too uncomfortable and she can't get in and out of the bed. Nonetheless at this point her wounds seem to be continually getting worse which is not what we want I'm getting somewhat concerned about her progress and how things are going to proceed if we do not intervene in some way shape or form. I therefore had a  very lengthy conversation today about offloading yet again and even made a specific suggestion for switching her to a memory foam mattress and even gave the information for a specific one that they could look at getting if it was something that they were interested in considering. She does not want to be considered for a hospital bed air mattress although honestly insurance would not cover it that she does not have any wounds on her trunk. 06/14/18 on evaluation today both wounds over the bilateral lateral malleolus  her ulcers appear to be doing better there's no evidence of pressure injury at this point. She did get the foam mattress for her bed and this does seem to have been extremely beneficial for her in my pinion. Her daughter states that she is having difficulty getting out of bed because of how soft it is. The patient also relates this to be. Nonetheless I do feel like she's actually doing better. Unfortunately right after and around the time she was getting the mattress she also sustained a fall when she got up to go pick up the phone and ended up injuring her right elbow she has 18 sut[address] place. We are not caring for this currently although home health is going to be taking the sutures out shortly. Nonetheless this may be something that we need to evaluate going forward. It depends on how well it has or has not healed in the end. She also recently saw an orthopedic specialist for an injection in the right shoulder just before her fall unfortunately the fall seems to have worsened her pain. 06/21/18 on evaluation today patient appears to be doing about the same in regard to her lateral malleolus ulcers. Both appear to be just a little bit deeper but again we are clinging away the necrotic and dead tissue which I think is why this is progressing towards a deeper realm as opposed to improving from my measurement standpoint in that regard. Nonetheless she has been tolerating the dressing  changes she absolutely hates the memory foam mattress topper that was obtained for her nonetheless Marylon, Verno [name] ([id number]) I do believe this is still doing excellent as far as taking care of excess pressure in regard to the lateral malleolus regions. She in fact has no pressure injury that I see whereas in weeks past it was week by week I was constantly seeing new pressure injuries. Overall I think it has been very beneficial for her. 07/03/18; patient arrives in my clinic today. She has deep punched out areas over her bilateral lateral malleoli. The area on the right has some more depth. We spent a lot of time today talking about pressure relief for these areas. This started when her daughter asked for a prescription for a memory foam mattress. I have never written a prescription for a mattress and I don't think insurances would pay for that on an ordinary bed. In any case he came up that she has foam boots that she refuses to wear. I would suggest going to these before any other offloading issues when she is in bed. They say she is meticulous about offloading this the rest of the day 07/10/18- She is seen in follow-up evaluation for bilateral, lateral malleolus ulcers. There is no improvement in the ulcers. She has purchased and is sleeping on a memory foam mattress/overlay, she has been using the offloading boots nightly over the past week. She has a follow up appointment with vascular medicine at the end of October, in my opinion this follow up should be expedited given her deterioration and suboptimal TBI results. We will order plain film xray of the left ankle as deeper structures are palpable; would consider having MRI, regardless of xray report(s). The ulcers will be treated with iodoflex/iodosorb, she is unable to safely change the dressings daily with santyl. 07/19/18 on evaluation today patient appears to be doing in general visually well in regard to her bilateral lateral  malleolus ulcers. She has been tolerating the dressing changes without complication which  is good news. With that being said we did have an x-ray performed on 07/12/18 which revealed a slight loosen see in the lateral portion of the distal left fibula which may represent artifact but underline lytic destruction or osteomyelitis could not be excluded. MRI was recommended. With that being said we can see about getting the patient scheduled for an MRI to further evaluate this area. In fact we have that scheduled currently for August 20 19,019. 07/26/18 on evaluation today patient's wound on the right lateral ankle actually appears to be doing fairly well at this point in my pinion. She has made some good progress currently. With that being said unfortunately in regard to the left lateral ankle ulcer this seems to be a little bit more problematic at this time. In fact as I further evaluated the situation she actually had bone exposed which is the first time that's been the case in the bone appear to be necrotic. Currently I did review patient's note from Dr. Bunnie Domino office with [facility]. He stated that ABI was 1.26 on the right and 0.95 on the left with good waveforms. Her perfusion is stable not reduced from previous studies and her digital waveforms were pretty good particularly on the right. His conclusion upon review of the note was that there was not much she could do to improve her perfusion and he felt she was adequate for wound healing. His suggestion was that she continued to see [place] and consider a synthetic skin graft if there was no underlying infection. He plans to see her back in six months or as needed. 08/01/18 on evaluation today patient appears to be doing better in regard to her right lateral ankle ulcer. Her left lateral ankle ulcer is about the same she still has bone involvement in evidence of necrosis. There does not appear to be evidence of infection at this  time On the right lateral lower extremity. I have started her on the Augmentin she picked this up and started this yesterday. This is to get her through until she sees infectious disease which is scheduled for 08/12/18. 08/06/18 on evaluation today patient appears to be doing rather well considering my discussion with patient's daughter at the end of last week. The area which was marked where she had erythema seems to be improved and this is good news. With that being said overall the patient seems to be making good improvement when it comes to the overall appearance of the right lateral ankle ulcer although this has been slow she at least is coming around in this regard. Unfortunately in regard to the left lateral ankle ulcer this is osteomyelitis based on the pathology report as well is bone culture. Nonetheless we are still waiting CT scan. Unfortunately the MRI we originally ordered cannot be performed as the patient is a pacemaker which I had overlooked. Nonetheless we are working on the CT scan approval and scheduling as of now. She did go to the hospital over the weekend and was placed on IV Cefzo for a couple of days. Fortunately this seems to have improved the erythema quite significantly which is good news. There does not appear to be any evidence of worsening infection at this time. She did have some bleeding after the last debridement therefore I did not perform any sharp debridement in regard to left lateral ankle at this point. Patient has been approved for a snap vac for the right lateral ankle. 08/14/18; the patient with wounds over  her bilateral lateral malleoli. The area on the right actually looks quite good. Been using a snap back on this area. Healthy granulation and appears to be filling in. Unfortunately the area on the left is really problematic. 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 Also noted  to have cellulitis. She saw Dr. Novella Olive of infectious disease today and was put on linezolid. We are able to verify this with her pharmacy. She is completed the Augmentin that she was already on. We've been using Iodoflex to this [name], FRIAR ([id number]) area 08/23/18 on evaluation today patient's wounds both actually appear to be doing better compared to my prior evaluations. Fortunately she showing signs of good improvement in regard to the overall wound status especially where were using the snap vac on the right. In regard to left lateral malleolus the wound bed actually appears to be much cleaner than previously noted. I do not feel any phone directly probed during evaluation today and though there is tendon noted this does not appear to be necrotic it's actually fairly good as far as the overall appearance of the tendon is concerned. In general the wound bed actually appears to be doing significantly better than it was previous. Patient is currently in the care of [name] and I did review that note today. He actually has her on two weeks of linezolid and then following the patient will be on 1-2 months of Keflex. That is the plan currently. She has been on antibiotics therapy as prescribed by myself initially starting on July 30, 2018 and has been on that continuously up to this point. 08/30/18 on evaluation today patient actually appears to be doing much better in regard to her right lateral malleolus ulcer. She has been tolerating the dressing changes specifically the snap vac without complication although she did have some issues with the seal currently. Apparently there was some trouble with getting it to maintain over the past week past Sunday. Nonetheless overall the wound appears better in regard to the right lateral malleolus region. In regard to left lateral malleolus this actually show some signs of additional granulation although there still tendon noted in the base of the  wound this appears to be healthy not necrotic in any way whatsoever. We are considering potentially using a snap vac for the left lateral malleolus as well the product wrap from [employer], [place], was present in the clinic today we're going to see this patient I did have her come in with me after obtaining consent from the patient and her daughter in order to look at the wound and see if there's any recommendation one way or another as to whether or not they felt the snapback could be beneficial for the left lateral malleolus region. But the conclusion was that it might be but that this is definitely a little bit deeper wound than what traditionally would be utilized for a snap vac. 09/06/18 on evaluation today patient actually appears to be doing excellent in my pinion in regard to both ankle ulcers. She has been tolerating the dressing changes without complication which is great news. Specifically we have been using the snap vac. In regard to the right ankle I'm not even sure that this is going to be necessary for today and following as the wound has filled in quite nicely. In regard to the left ankle I do believe that we're seeing excellent epithelialization from the  edge as well as granulation in the central portion the tendon is still exposed but there's no evidence of necrotic bone and in general I feel like the patient has made excellent progress even compared to last week with just one week of the snap vac. 09/11/18; this is a patient who has wounds on her bilateral lateral malleoli. Initially both of these were deep stage IV wounds in the setting of chronic arterial insufficiency. She has been revascularized. As I understand think she been using snap vacs to both of these wounds however the area on the right became more superficial and currently she is only using it on the left. Using silver collagen on the right and silver collagen under the back on the left I believe 09/19/18 on evaluation today  patient actually appears to be doing very well in regard to her lateral malleolus or ulcers bilaterally. She has been tolerating the dressing changes without complication. Fortunately there does not appear to be any evidence of infection at this time. Overall I feel like she is improving in an excellent manner and I'm very pleased with the fact that everything seems to be turning towards the better for her. This has obviously been a long road. 09/27/18 on evaluation today patient actually appears to be doing very well in regard to her bilateral lateral malleolus ulcers. She has been tolerating the dressing changes without complication. Fortunately there does not appear to be any evidence of infection at this time which is also great news. No fevers, chills, nausea, or vomiting noted at this time. Overall I feel like she is doing excellent with the snap vac on the left malleolus. She had 40 mL of fluid collection over the past week. 10/04/18 on evaluation today patient actually appears to be doing well in regard to her bilateral lateral malleolus ulcers. She continues to tolerate the dressing changes without complication. One issue that I see is the snap vac on the left lateral malleolus which appears to have sealed off some fluid underlying this area and has not really allowed it to heal to the degree that I would like to see. For that reason I did suggest at this point we may want to pack a small piece of packing strip into this region to allow it to more effectively wick out fluid. 10/11/18 in general the patient today does not feel that she has been doing very well. She's been a little bit lethargic and subsequently is having bodyaches as well according to what she tells me today. With that being said overall she has been concerned with the fact that something may be worsening although to be honest her wounds really have not been appearing poorly. She does have a new ulcer on her left heel  unfortunately. This may be pressure related. Nonetheless it seems to me to have potentially started at least as a blister I do not see any evidence of deep tissue injury. In regard to the left ankle the snap vac still seems to be causing the ceiling off of the deeper part of the wound which is in turn trapping fluid. I'm not extremely pleased with the overall appearance as far as progress from last week to this week therefore I'm gonna discontinue [name], [name] ([id number]) the snap vac at this point. 10/18/18 patient unfortunately this point has not been feeling well for the past several days. She was seen by Grayland Ormond her primary care provider who is a Librarian, academic at [facility]. Subsequently she  states that she's been very weak and generally feeling malaise. No fevers, chills, nausea, or vomiting noted at this time. With that being said bloodwork was performed at the PCP office on the 11th of this month which showed a white blood cell count of 10.7. This was repeated today and shows a white blood cell count of 12.4. This does show signs of worsening. Coupled with the fact that she is feeling worse and that her left ankle wound is not really showing signs of improvement I feel like this is an indication that the osteomyelitis is likely exacerbating not improving. Overall I think we may also want to check her C-reactive protein and sedimentation rate. Actually did call [facility] office this afternoon while the patient was in the office here with me. Subsequently based on the findings we discussed treatment possibilities and I think that it is appropriate for [place] to go ahead and initiate treatment with doxycycline which I'm going to do. Subsequently he did agree to see about adding a CRP and sedimentation rate to her orders. If that has not already been drawn to where they can run it they will contact the patient she can come back to have that check. They are in agreement  with plan as far as the patient and her daughter are concerned. Nonetheless also think we need to get in touch with Dr. Henreitta Leber office to see about getting the patient scheduled with him as soon as possible. 11/08/18 on evaluation today patient presents for follow-up concerning her bilateral foot and ankle ulcers. I did do an extensive review of her chart in epic today. Subsequently she was seen by [name] he did initiate Cefepime IV antibiotic therapy. Subsequently she had some issues with her PICC line this had to be removed because it was coiled and then replaced. Fortunately that was now settled. Unfortunately she has continued have issues with her left heel as well as the issues that she is experiencing with her bilateral lateral malleolus regions. I do believe however both areas seem to be doing a little bit better on evaluation today which is good news. No fevers, chills, nausea, or vomiting noted at this time. She actually has an angiogram schedule with Dr. dew on this coming Monday, November 11, 2018. Subsequently the patient states that she is feeling much better especially than what she was roughly 2 weeks ago. She actually had to cancel an appointment because she was feeling so poorly. No fevers, chills, nausea, or vomiting noted at this time. 11/15/18 on evaluation today patient actually is status post having had her angiogram with Dr. dew Monday, four days ago. 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 He had to go and work on several areas of the vasculature fortunately he was able to obtain no more than a 30% residual stenosis throughout post procedure. I reviewed this note today. I think this will definitely help with healing at this time. Fortunately there does not appear to be any signs of infection and I do feel like ratio already has a better appearance to it. 11/22/18 upon evaluation today patient actually appears to be doing very well in regard to  her wounds in general. The right lateral malleolus looks excellent the heel looks better in the left lateral malleolus also appears to be doing a little better. With that being said the right second toe actually appears to be open and training we been watching this is been dry and  stable but now is open. Patient History Information obtained from Patient. Family History Cancer - Father,Siblings, Heart Disease - Siblings, No family history of Diabetes, Hereditary Spherocytosis, Hypertension, Kidney Disease, Lung Disease, Seizures, Stroke, Thyroid Problems, Tuberculosis. Social History Never smoker, Marital Status - Married, Alcohol Use - Never, Drug Use - No History, Caffeine Use - Rarely. Review of Systems (ROS) Constitutional Symptoms (General Health) Denies complaints or symptoms of Fever, Chills. Respiratory The patient has no complaints or symptoms. Cardiovascular The patient has no complaints or symptoms. Psychiatric The patient has no complaints or symptoms. SHADAE, REINO ([id number]) Objective Constitutional Well-nourished and well-hydrated in no acute distress. Vitals Time Taken: 3:23 PM, Height: 65 in, Weight: 154.3 lbs, BMI: 25.7, Temperature: 98.1 F, Pulse: 90 bpm, Respiratory Rate: 16 breaths/min, Blood Pressure: 144/68 mmHg. Respiratory normal breathing without difficulty. clear to auscultation bilaterally. Cardiovascular regular rate and rhythm with normal S1, S2. Psychiatric this patient is able to make decisions and demonstrates good insight into disease process. Alert and Oriented x 3. pleasant and cooperative. General Notes: I did perform sharp debridement of the hill ulcer as well is the right second toe ulcer. Post debridement both locations appear to be doing significantly better which is great news. Integumentary (Hair, Skin) Wound #1 status is Open. Original cause of wound was Gradually Appeared. The wound is located on the Right,Lateral Malleolus.  The wound measures 0.2cm length x 0.2cm width x 0.1cm depth; 0.031cm^2 area and 0.003cm^3 volume. There is Fat Layer (Subcutaneous Tissue) Exposed exposed. There is no tunneling or undermining noted. There is a small amount of serous drainage noted. The wound margin is distinct with the outline attached to the wound base. There is no granulation within the wound bed. There is a small (1-33%) amount of necrotic tissue within the wound bed including Adherent Slough. The periwound skin appearance exhibited: Rubor. The periwound skin appearance did not exhibit: Callus, Crepitus, Excoriation, Induration, Rash, Scarring, Dry/Scaly, Maceration, Atrophie Blanche, Cyanosis, Ecchymosis, Hemosiderin Staining, Mottled, Pallor, Erythema. Periwound temperature was noted as No Abnormality. The periwound has tenderness on palpation. Wound #2 status is Open. Original cause of wound was Gradually Appeared. The wound is located on the Left,Lateral Malleolus. The wound measures 0.9cm length x 0.9cm width x 0.7cm depth; 0.636cm^2 area and 0.445cm^3 volume. There is Fat Layer (Subcutaneous Tissue) Exposed exposed. There is no undermining noted, however, there is tunneling at 4:00 with a maximum distance of 1.4cm. There is a medium amount of serous drainage noted. The wound margin is epibole. There is small (1-33%) pink, pale granulation within the wound bed. There is a large (67-100%) amount of necrotic tissue within the wound bed including Adherent Slough. The periwound skin appearance exhibited: Maceration. The periwound skin appearance did not exhibit: Callus, Crepitus, Excoriation, Induration, Rash, Scarring, Dry/Scaly, Atrophie Blanche, Cyanosis, Ecchymosis, Hemosiderin Staining, Mottled, Pallor, Rubor, Erythema. Periwound temperature was noted as No Abnormality. The periwound has tenderness on palpation. Wound #3 status is Open. Original cause of wound was Gradually Appeared. The wound is located on the Left  Calcaneus. The wound measures 2cm length x 2.7cm width x 0.1cm depth; 4.241cm^2 area and 0.424cm^3 volume. There is Fat Layer (Subcutaneous Tissue) Exposed exposed. There is no tunneling or undermining noted. There is a large amount of serous drainage noted. The wound margin is indistinct and nonvisible. There is small (1-33%) hyper - granulation within the wound bed. There is a large (67-100%) amount of necrotic tissue within the wound bed including Eschar and Adherent Slough. The periwound skin  appearance exhibited: Maceration. The periwound skin appearance did not exhibit: Callus, Crepitus, Excoriation, Induration, Rash, Scarring, Dry/Scaly, Atrophie Blanche, Cyanosis, Ecchymosis, Hemosiderin Staining, Mottled, Pallor, Rubor, Erythema. Periwound temperature was noted as No Abnormality. The periwound has tenderness on palpation. [name], [name] ([id number]) Wound #4 status is Open. Original cause of wound was Pressure Injury. The wound is located on the Distal Toe Second. The wound measures 0.3cm length x 0.3cm width x 0.1cm depth; 0.071cm^2 area and 0.007cm^3 volume. There is Fat Layer (Subcutaneous Tissue) Exposed exposed. There is no tunneling or undermining noted. There is a small amount of serous drainage noted. The wound margin is distinct with the outline attached to the wound base. There is no granulation within the wound bed. There is a large (67-100%) amount of necrotic tissue within the wound bed including Eschar and Adherent Slough. The periwound skin appearance exhibited: Callus, Dry/Scaly. The periwound skin appearance did not exhibit: Crepitus, Excoriation, Induration, Rash, Scarring, Maceration, Atrophie Blanche, Cyanosis, Ecchymosis, Hemosiderin Staining, Mottled, Pallor, Rubor, Erythema. 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 There was a Excisional Skin/Subcutaneous Tissue Debridement with a total area of 5.4 sq cm performed by STONE III, HOYT E., PA-C. With the following instrument(s): Curette to remove Viable tissue/material. Material removed includes Subcutaneous Tissue after achieving pain control using Lidocaine. No specimens were taken. A time out was conducted at 15:49, prior to the start of the procedure. A Minimum amount of bleeding was controlled with Pressure. The procedure was tolerated well with a pain level of 0 throughout and a pain level of 0 following the procedure. Post Debridement Measurements: 2cm length x 2.7cm width x 0.2cm depth; 0.848cm^3 volume. Post debridement Stage noted as Category/Stage II. Character of Wound/Ulcer Post Debridement is improved. Post procedure Diagnosis Wound #3: Same as Pre-Procedure Wound #4 Pre-procedure diagnosis of Wound #4 is a Pressure Ulcer located on the Distal Toe Second . There was a Selective/Open Wound Skin/Dermis Debridement with a total area of 4 sq cm performed by STONE III, HOYT E., PA-C. With the following instrument(s): Curette to remove Non-Viable tissue/material. Material removed includes Skin: Dermis after achieving pain control using Lidocaine. No specimens were taken. A time out was conducted at 15:49, prior to the start of the procedure. A Minimum amount of bleeding was controlled with Pressure. The procedure was tolerated well with a pain level of 0 throughout and a pain level of 0 following the procedure. Post Debridement Measurements: 0.6cm length x 0.8cm width x 0.1cm depth; 0.038cm^3  volume. Post debridement Stage noted as Category/Stage II. Character of Wound/Ulcer Post Debridement is improved. Post procedure Diagnosis Wound #4: Same as Pre-Procedure [name], [name] ([id number]) Plan Wound Cleansing: Wound #1 Right,Lateral Malleolus: Clean wound with Normal Saline. Cleanse wound with mild soap and water May Shower, gently pat wound dry prior to applying new dressing. Wound #2 Left,Lateral Malleolus: Clean wound with Normal Saline. Cleanse wound with mild soap and water May Shower, gently pat wound dry prior to applying new dressing. Wound #3 Left Calcaneus: Clean wound with Normal Saline. Cleanse  wound with mild soap and water May Shower, gently pat wound dry prior to applying new dressing. Wound #4 Distal Toe Second: Clean wound with Normal Saline. Cleanse wound with mild soap and water May Shower, gently pat wound dry prior to applying new dressing. Anesthetic (add to Medication List): Wound #1 Right,Lateral Malleolus: Topical Lidocaine 4% cream applied to wound bed prior to debridement (In Clinic Only). Wound #2 Left,Lateral Malleolus: Topical Lidocaine 4% cream applied to wound bed prior to debridement (In Clinic Only). Wound #3 Left Calcaneus: Topical Lidocaine 4% cream applied to wound bed prior to debridement (In Clinic Only). Wound #4 Distal Toe Second: Topical Lidocaine 4% cream applied to wound bed prior to debridement (In Clinic Only). Primary Wound Dressing: Wound #1 Right,Lateral Malleolus: Silver Collagen Wound #3 Left Calcaneus: Silver Collagen Wound #4 Distal Toe Second: Silver Collagen Wound #2 Left,Lateral Malleolus: Silver Alginate Secondary Dressing: Wound #1 Right,Lateral Malleolus: Boardered Foam Dressing Wound #2 Left,Lateral Malleolus: Boardered Foam Dressing Wound #3 Left Calcaneus: Boardered Foam Dressing Wound #4 Distal Toe Second: Conform/Kerlix Dressing Change Frequency: Wound #1 Right,Lateral Malleolus: Change  Dressing Monday, Wednesday, Friday Wound #2 Left,Lateral Malleolus: Change Dressing Monday, Wednesday, Friday Wound #3 Left Calcaneus: Change Dressing Monday, Wednesday, Friday PELAGIA, IACOBUCCI ([id number]) Wound #4 Distal Toe Second: Change Dressing Monday, Wednesday, Friday Follow-up Appointments: Wound #1 Right,Lateral Malleolus: Return Appointment in 2 weeks. - Return 12-03-2018 Wound #2 Left,Lateral Malleolus: Return Appointment in 2 weeks. - Return 12-03-2018 Wound #3 Left Calcaneus: Return Appointment in 2 weeks. - Return 12-03-2018 Wound #4 Distal Toe Second: Return Appointment in 2 weeks. - Return 12-03-2018 Edema Control: Wound #1 Right,Lateral Malleolus: Patient to wear own compression stockings Patient to wear own Velcro compression garment. Elevate legs to the level of the heart and pump ankles as often as possible Wound #2 Left,Lateral Malleolus: Patient to wear own compression stockings Patient to wear own Velcro compression garment. Elevate legs to the level of the heart and pump ankles as often as possible Wound #3 Left Calcaneus: Patient to wear own compression stockings Patient to wear own Velcro compression garment. Elevate legs to the level of the heart and pump ankles as often as possible Wound #4 Distal Toe Second: Patient to wear own compression stockings Patient to wear own Velcro compression garment. Elevate legs to the level of the heart and pump ankles as often as possible Off-Loading: Wound #1 Right,Lateral Malleolus: Turn and reposition every 2 hours Other: - do not put pressure on your ankles Wound #2 Left,Lateral Malleolus: Turn and reposition every 2 hours Other: - do not put pressure on your ankles Wound #3 Left Calcaneus: Turn and reposition every 2 hours Other: - do not put pressure on your ankles Wound #4 Distal Toe Second: Turn and reposition every 2 hours Other: - do not put pressure on your ankles Additional Orders /  Instructions: Wound #1 Right,Lateral Malleolus: Increase protein intake. Increase protein intake. Wound #2 Left,Lateral Malleolus: Increase protein intake. Increase protein intake. Wound #3 Left Calcaneus: Increase protein intake. Increase protein intake. Wound #4 Distal Toe Second: Increase protein intake. Increase protein intake. Home Health: Wound #1 Right,Lateral Malleolus: [place] [facility]. Patient will return 12-31. Please visit patient Monday, Wednesday, and Friday Home Health Nurse may visit PRN to address patient s wound care needs. FACE TO FACE ENCOUNTER: MEDICARE and MEDICAID PATIENTS: I certify that this patient is under my care and that I had a face-to-face encounter that meets the physician face-to-face encounter requirements with this  patient on this date. The ARYIA, DELIRA ([id number]) encounter with the patient was in whole or in part for the following MEDICAL CONDITION: (primary reason for [place]) MEDICAL NECESSITY: I certify, that based on my findings, NURSING services are a medically necessary home health service. HOME BOUND STATUS: I certify that my clinical findings support that this patient is homebound (i.e., Due to illness or injury, pt requires aid of supportive devices such as crutches, cane, wheelchairs, walkers, the use of special transportation or the assistance of another person to leave their place of residence. There is a normal inability to leave the home and doing so requires considerable and taxing effort. Other absences are for medical reasons / religious services and are infrequent or of short duration when for other reasons). If current dressing causes regression in wound condition, may D/C ordered dressing product/s and apply Normal Saline Moist Dressing daily until next [place] / Other MD appointment. [place] of regression in wound condition at [phone]. Please direct any  NON-WOUND related issues/requests for orders to patient's Primary Care Physician Wound #2 Left,Lateral Malleolus: [place] [facility] - [facility]. Patient will return 12-31. Please visit patient Monday, Wednesday, and Friday Home Health Nurse may visit PRN to address patient s wound care needs. FACE TO FACE ENCOUNTER: MEDICARE and MEDICAID PATIENTS: I certify that this patient is under my care and that I had a face-to-face encounter that meets the physician face-to-face encounter requirements with this patient on this date. The encounter with the patient was in whole or in part for the following MEDICAL CONDITION: (primary reason for [place]) MEDICAL NECESSITY: I certify, that based on my findings, NURSING services are a medically necessary home health service. HOME BOUND STATUS: I certify that my clinical findings support that this patient is homebound (i.e., Due to illness or injury, pt requires aid of supportive devices such as crutches, cane, wheelchairs, walkers, the use of special transportation or the assistance of another person to leave their place of residence. There is a normal inability to leave the home and doing so requires considerable and taxing effort. Other absences are for medical reasons / religious services and are infrequent or of short duration when for other reasons). If current dressing causes regression in wound condition, may D/C ordered dressing product/s and apply Normal Saline Moist Dressing daily until next [place] / Other MD appointment. [place] of regression in wound condition at [phone]. Please direct any NON-WOUND related issues/requests for orders to patient's Primary Care Physician Wound #3 Left Calcaneus: [place] [facility] - [facility]. Patient will return 12-31. Please visit patient Monday, Wednesday, and Friday Home Health Nurse may visit PRN to address patient s wound care needs. FACE TO FACE  ENCOUNTER: MEDICARE and MEDICAID PATIENTS: I certify that this patient is under my care and that I had a face-to-face encounter that meets the physician face-to-face encounter requirements with this patient on this date. The encounter with the patient was in whole or in part for the following MEDICAL CONDITION: (primary reason for [place]) MEDICAL NECESSITY: I certify, that based on my findings, NURSING services are a medically necessary home health service. HOME BOUND STATUS: I certify that my clinical findings support that this patient is homebound (i.e., Due to illness or injury, pt requires aid of supportive devices such as crutches, cane, wheelchairs, walkers, the use of special transportation or the assistance of another person to leave their place of residence. There  is a normal inability to leave the home and doing so requires considerable and taxing effort. Other absences are for medical reasons / religious services and are infrequent or of short duration when for other reasons). If current dressing causes regression in wound condition, may D/C ordered dressing product/s and apply Normal Saline Moist Dressing daily until next [place] / Other MD appointment. [place] of regression in wound condition at [phone]. Please direct any NON-WOUND related issues/requests for orders to patient's Primary Care Physician Wound #4 Distal Toe Second: [place] [facility]its - [facility]. Patient will return 12-31. Please visit patient Monday, Wednesday, and Friday Home Health Nurse may visit PRN to address patient s wound care needs. FACE TO FACE ENCOUNTER: MEDICARE and MEDICAID PATIENTS: I certify that this patient is under my care and that I had a face-to-face encounter that meets the physician face-to-face encounter requirements with this patient on this date. The encounter with the patient was in whole or in part for the following MEDICAL CONDITION:  (primary reason for [place]) MEDICAL NECESSITY: I certify, that based on my findings, NURSING services are a medically necessary home health service. HOME BOUND STATUS: I certify that my clinical findings support that this patient is homebound (i.e., Due to illness or injury, pt requires aid of supportive devices such as crutches, cane, wheelchairs, walkers, the use of special transportation or the assistance of another person to leave their place of residence. There is a normal inability to leave the home and doing so requires considerable and taxing effort. Other absences are for medical reasons / religious services and are infrequent or of short duration when for other reasons). If current dressing causes regression in wound condition, may D/C ordered dressing product/s and apply Normal Saline Moist Dressing daily until next [place] / Other MD appointment. [place] of regression in wound condition at [phone]. Please direct any NON-WOUND related issues/requests for orders to patient's Primary Care Physician Jakiah, Bienaime [name] ([id number]) Yetta Flock suggest currently that we continue with the above wound care measures. She's in agreement with the plan. We will check into a snapback for her as they would like to see about tenting this again to see if it will be beneficial for the left lateral malleolus ulcer. I think that is appropriate for [place] to check into. Please see above for specific wound care orders. We will see patient for re-evaluation in 2 week(s) here in the clinic. If anything worsens or changes patient will contact our office for additional recommendations. Electronic Signature(s) Signed: 11/23/2018 12:35:32 AM By: Worthy Keeler PA-C Entered By: Worthy Keeler on 11/23/2018 00:21:16 [name] ([id number]) -------------------------------------------------------------------------------- ROS/PFSH Details Patient Name: [name] Date of Service: 11/22/2018 3:00 PM Medical Record Number: [id number] Patient Account Number: [id number] Date of Birth/Sex: Dec 02, 1923 (84 y.o. F) Treating RN: Montey Hora Primary Care Provider: Grayland Ormond Other Clinician: Referring Provider: Grayland Ormond Treating Provider/Extender: Melburn Hake, HOYT Weeks in Treatment: 22 Information Obtained From Patient Wound History Do you currently have one or more open woundso Yes How many open wounds do you currently haveo 1 Approximately how long have you had your woundso 2 yrs How have you been treating your wound(s) until nowo mupirocin, soaking in epsom salt Has your wound(s) ever healed and then re-openedo No Have you had any lab work done in the past montho No Have you tested positive for an antibiotic resistant organism (MRSA, VRE)o No Have you  tested positive for osteomyelitis (bone infection)o No Have you had any tests for circulation on your legso Yes Who ordered the testo Dr. Lucky Cowboy [facility] test [facility] Constitutional Symptoms (General Health) Complaints and Symptoms: Negative for: Fever; Chills Eyes Medical History: Positive for: Cataracts - surgery Respiratory Complaints and Symptoms: No Complaints or Symptoms Cardiovascular Complaints and Symptoms: No Complaints or Symptoms Medical History: Positive for: Congestive Heart Failure; Hypertension Musculoskeletal Medical History: Positive for: Osteoarthritis Neurologic Medical History: Positive for: Neuropathy RAIZY, AUZENNE ([id number]) Oncologic Medical History: Negative for: Received Chemotherapy; Received Radiation Psychiatric Complaints and Symptoms: No Complaints or Symptoms HBO Extended History Items Eyes: Cataracts Immunizations Pneumococcal Vaccine: Received Pneumococcal Vaccination: Yes Implantable Devices Family and Social History Cancer: Yes - Father,Siblings; Diabetes: No; Heart Disease: Yes - Siblings; Hereditary  Spherocytosis: No; Hypertension: No; Kidney Disease: No; Lung Disease: No; Seizures: No; Stroke: No; Thyroid Problems: No; Tuberculosis: No; Never smoker; Marital Status - Married; Alcohol Use: Never; Drug Use: No History; Caffeine Use: Rarely; Financial Concerns: No; Food, Clothing or Shelter Needs: No; Support System Lacking: No; Transportation Concerns: No; Advanced Directives: No; Patient does not want information on Advanced Directives; Do not resuscitate: No; Living Will: Yes (Not Provided); Medical Power of Attorney: No Physician Affirmation I have reviewed and agree with the above information. Electronic Signature(s) Signed: 11/23/2018 12:35:32 AM By: Worthy Keeler PA-C Signed: 11/25/2018 4:55:08 PM By: Montey Hora Entered By: Worthy Keeler on 11/23/2018 00:18:24 [name] ([id number]) -------------------------------------------------------------------------------- SuperBill Details Patient Name: [name] Date of Service: 11/22/2018 Medical Record Number: [id number] Patient Account Number: [id number] Date of Birth/Sex: [birth year] (84 y.o. 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 I70.243 Atherosclerosis of native arteries of left leg with ulceration of ankle I89.0 Lymphedema, not elsewhere classified B35.4 Tinea corporis 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, left ankle and foot Facility Procedures CPT4 Code: 70177939 Description: 03009 - DEB SUBQ TISSUE 20 SQ CM/< ICD-10 Diagnosis  Description L89.623 Pressure ulcer of left heel, stage 3 Modifier: Quantity: 1 CPT4 Code: 23300762 Description: 26333 - DEBRIDE WOUND 1ST 20 SQ CM OR < ICD-10 Diagnosis Description L89.892 Pressure ulcer of other site, stage 2 Modifier: Quantity: 1 Physician Procedures CPT4 Code: 5456256 Description: 38937 - WC PHYS LEVEL 4 - EST PT ICD-10 Diagnosis Description 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, stage 4 L89.524 Pressure ulcer of left ankle, stage 4 L89.623 Pressure ulcer of left heel, stage 3 L89.892 Pressure ulcer of other site,  stage 2 Modifier: 25 Quantity: 1 CPT4 Code: 3428768 Description: 11572 - WC PHYS SUBQ TISS 20 SQ CM ICD-10 Diagnosis Description L89.623 Pressure ulcer of left heel, stage 3 Modifier: Quantity: 1 CPT4 Code: 6203559 Marlou Starks Description: 97597 - WC PHYS DEBR WO ANESTH 20 SQ CM ICD-10 Diagnosis Description G. ([id number]) Modifier: Quantity: 1 Electronic Signature(s) Signed: 11/23/2018 12:35:32 AM By: Worthy Keeler PA-C Entered By: Worthy Keeler on 11/23/2018 00:21:48

## 2018-11-29 ENCOUNTER — Ambulatory Visit: Payer: Medicare Other | Admitting: Family Medicine

## 2018-12-03 ENCOUNTER — Encounter: Payer: Medicare Other | Admitting: Family Medicine

## 2018-12-03 DIAGNOSIS — L89514 Pressure ulcer of right ankle, stage 4: Secondary | ICD-10-CM | POA: Diagnosis not present

## 2018-12-04 NOTE — Progress Notes (Addendum)
KELSE, PLOCH ([id number]) Visit Report for 12/03/2018 Arrival Information Details Patient Name: [name], [name] Date of Service: 12/03/2018 11:00 AM Medical Record Number: [id number] Patient Account Number: [id number] Date of Birth/Sex: January 25, 1923 (85 y.o. F) Treating RN: Harold Barban Primary Care Joesiah Lonon: Grayland Ormond Other Clinician: Referring Shayaan Parke: Grayland Ormond Treating Ariabella Brien/Extender: Oneida Arenas in Treatment: 68 Visit Information History Since Last Visit Added or deleted any medications: No Patient Arrived: Walker Any new allergies or adverse reactions: No Arrival Time: 10:49 Had a fall or experienced change in No Accompanied By: daughter activities of daily living that may affect Transfer Assistance: None risk of falls: Patient Identification Verified: Yes Signs or symptoms of abuse/neglect since last visito No Secondary Verification Process Yes Has Dressing in Place as Prescribed: Yes Completed: Pain Present Now: Yes Patient Requires Transmission-Based No Precautions: Patient Has Alerts: Yes Patient Alerts: ABI AVVS 03/29/18 L 1.05 R 1.03, TBI L .56, R .85 ABI AVVS 07/23/18 L .95 R 1.26 Electronic Signature(s) Signed: 12/03/2018 4:38:01 PM By: Harold Barban Entered By: Harold Barban on 12/03/2018 10:52:27 Wessner, [name] ([id number]) -------------------------------------------------------------------------------- Lower Extremity Assessment Details Patient Name: [name] Date of Service: 12/03/2018 11:00 AM Medical Record Number: [id number] Patient Account Number: [id number] Date of Birth/Sex: 11-10-1923 (85 y.o. F) Treating RN: Harold Barban Primary Care Jolissa Kapral: Grayland Ormond Other Clinician: Referring Hasan Douse: Grayland Ormond Treating Ethel Veronica/Extender: Beather Arbour Weeks in Treatment: 43 Electronic Signature(s) Signed: 12/03/2018 4:38:01 PM By: Harold Barban Entered By: Harold Barban on  12/03/2018 11:09:53 Rozelle, [name] ([id number]) -------------------------------------------------------------------------------- Multi Wound Chart Details Patient Name: [name] Date of Service: 12/03/2018 11:00 AM Medical Record Number: [id number] Patient Account Number: [id number] Date of Birth/Sex: 27-Feb-1923 (85 y.o. 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 N/A Comorbid History: Cataracts, Congestive Heart Cataracts, Congestive Heart Cataracts, Congestive Heart Failure, Hypertension, Failure, Hypertension, Failure, Hypertension, Osteoarthritis, Neuropathy Osteoarthritis, Neuropathy Osteoarthritis, Neuropathy Date Acquired: 08/11/2015 04/12/2018 10/04/2018 Weeks of Treatment: 68 32 7 Wound Status: Open Open Open Measurements L x W x D 0.2x0.1x0.1 0.9x0.7x0.6 1.8x2.4x0.1 (cm) Area (cm) : 0.016 0.495 3.393 Volume (cm) : 0.002 0.297 0.339 % Reduction in Area: 99.00% 36.90% -23.40% % Reduction in Volume: 99.60% -275.90% 38.40% Position 1 (o'clock): 5 Maximum Distance 1 (cm): 2 Tunneling: No Yes No Undermining: No No No Classification: Category/Stage IV Category/Stage IV Category/Stage II Exudate Amount: Small Medium Large Exudate Type: Serous Serous Serous Exudate Color: amber amber amber Wound Margin: Distinct, outline attached Epibole  Indistinct, nonvisible Granulation Amount: None Present (0%) Small (1-33%) Small (1-33%) Granulation Quality: N/A Pink, Pale Hyper-granulation Necrotic Amount: Small (1-33%) Large (67-100%) Large (67-100%) Necrotic Tissue: Adherent [address] Adherent [place], [place]. ([id number]) Exposed Structures: Fat Layer (Subcutaneous Fat Layer (Subcutaneous Fat Layer (Subcutaneous Tissue) Exposed: Yes Tissue) Exposed: Yes Tissue) Exposed: Yes Fascia: No Fascia: No Fascia: No Tendon: No Tendon: No Tendon: No Muscle: No Muscle: No Muscle: No Joint: No Joint: No Joint: No Bone: No Bone: No Bone: No Epithelialization: Medium (34-66%) None None Debridement: Debridement - Selective/Open Debridement - Selective/Open Debridement - Selective/Open  Wound Wound Wound Pre-procedure 11:20 11:20 11:20 Verification/Time Out Taken: Pain Control: Lidocaine Lidocaine Lidocaine Tissue Debrided: Callus Slough Callus, Slough Level: Non-Viable Tissue Non-Viable Tissue Non-Viable Tissue Debridement Area (sq cm): 0.02 0.12 4.32 Instrument: Curette Curette Curette Bleeding: None None None Procedural Pain: 0 0 0 Post Procedural Pain: 0 0 0 Debridement Treatment Procedure was tolerated well Procedure was tolerated well Procedure was tolerated well Response: Post Debridement 0.2x0.2x0.1 0.9x0.7x0.6 1.8x2.4x0.1 Measurements L x W x D (cm) Post Debridement Volume: 0.003 0.297 0.339 (cm) Post Debridement Stage: Category/Stage IV Category/Stage IV Category/Stage II Periwound Skin Texture: Excoriation: No Excoriation: No Excoriation: No Induration: No Induration: No Induration: No Callus: No Callus: No Callus: No Crepitus: No Crepitus: No Crepitus: No Rash: No Rash: No Rash: No Scarring: No Scarring: No Scarring: No Periwound Skin Moisture: Maceration: No Maceration: Yes Maceration: Yes Dry/Scaly: No Dry/Scaly: No Dry/Scaly: No Periwound Skin Color: Rubor:  Yes Atrophie Blanche: No Atrophie Blanche: No Atrophie Blanche: No Cyanosis: No Cyanosis: No Cyanosis: No Ecchymosis: No Ecchymosis: No Ecchymosis: No Erythema: No Erythema: No Erythema: No Hemosiderin Staining: No Hemosiderin Staining: No Hemosiderin Staining: No Mottled: No Mottled: No Mottled: No Pallor: No Pallor: No Pallor: No Rubor: No Rubor: No Temperature: No Abnormality No Abnormality No Abnormality Tenderness on Palpation: Yes Yes Yes Wound Preparation: Ulcer Cleansing: Ulcer Cleansing: Ulcer Cleansing: Rinsed/Irrigated with Saline Rinsed/Irrigated with Saline Rinsed/Irrigated with Saline Topical Anesthetic Applied: Topical Anesthetic Applied: Topical Anesthetic Applied: Other: lidocaine 4% Other: lidocaine 4% Other: lidocaine 4% Procedures Performed: Debridement Debridement Debridement Wound Number: [id number] N/A Photos: No Photos N/A [name], [name] ([id number]) Wound Location: Toe Second - Distal Toe Great - Distal N/A Wounding Event: Pressure Injury Footwear Injury N/A Primary Etiology: Pressure Ulcer Pressure Ulcer N/A Secondary Etiology: N/A N/A N/A Comorbid History: Cataracts, Congestive Heart Cataracts, Congestive Heart N/A Failure, Hypertension, Failure, Hypertension, Osteoarthritis, Neuropathy Osteoarthritis, Neuropathy Date Acquired: 10/23/2018 12/03/2018 N/A Weeks of Treatment: 1 0 N/A Wound Status: Open Open N/A Measurements L x W x D 0.7x0.8x0.4 0.9x0.5x0.1 N/A (cm) Area (cm) : 0.44 0.353 N/A Volume (cm) : 0.176 0.035 N/A % Reduction in Area: -519.70% N/A N/A % Reduction in Volume: -2414.30% N/A N/A Starting Position 1 9 (o'clock): Ending Position 1 10 (o'clock): Maximum Distance 1 (cm): 1.2 Tunneling: No No N/A Undermining: Yes No N/A Classification: Category/Stage II Category/Stage II N/A Exudate Amount: Small Small N/A Exudate Type: Serous Serosanguineous N/A Exudate Color: amber red, brown N/A Wound Margin: Distinct,  outline attached Flat and Intact N/A Granulation Amount: None Present (0%) None Present (0%) N/A Granulation Quality: N/A N/A N/A Necrotic Amount: Large (67-100%) None Present (0%) N/A Necrotic Tissue: Eschar, Adherent Slough N/A N/A Exposed Structures: Fat Layer (Subcutaneous Fat Layer (Subcutaneous N/A Tissue) Exposed: Yes Tissue) Exposed: Yes Fascia: No Fascia: No Tendon: No Tendon: No Muscle: No Muscle: No Joint: No Joint: No Bone: No Bone: No Epithelialization: None None N/A Debridement: Debridement - Excisional N/A N/A Pre-procedure 11:20 N/A N/A Verification/Time Out Taken: Pain Control: Lidocaine N/A N/A Tissue Debrided: Callus, Subcutaneous, Slough N/A N/A Level: Skin/Subcutaneous Tissue N/A N/A Debridement Area (sq cm): 0.56 N/A N/A Instrument: Curette N/A N/A Bleeding: None N/A N/A Procedural Pain: 0 N/A N/A Post Procedural Pain: 0 N/A N/A Debridement Treatment Procedure was tolerated well N/A N/A Response: [name] ([id number]) Post Debridement 0.9x1.1x0.4 N/A N/A Measurements L x W x D (cm) Post Debridement Volume: 0.311 N/A N/A (cm) Post Debridement Stage: Category/Stage II N/A N/A Periwound Skin Texture: Callus: Yes Callus: Yes N/A  Excoriation: No Excoriation: No Induration: No Induration: No Crepitus: No Crepitus: No Rash: No Rash: No Scarring: No Scarring: No Periwound Skin Moisture: Dry/Scaly: Yes Dry/Scaly: Yes N/A Maceration: No Maceration: No Periwound Skin Color: Atrophie Blanche: No Atrophie Blanche: No N/A Cyanosis: No Cyanosis: No Ecchymosis: No Ecchymosis: No Erythema: No Erythema: No Hemosiderin Staining: No Hemosiderin Staining: No Mottled: No Mottled: No Pallor: No Pallor: No Rubor: No Rubor: No Temperature: No Abnormality No Abnormality N/A Tenderness on Palpation: Yes No N/A Wound Preparation: Ulcer Cleansing: Ulcer Cleansing: N/A Rinsed/Irrigated with Saline Rinsed/Irrigated with Saline Topical  Anesthetic Applied: Other: lidocaince 4% Procedures Performed: Debridement N/A N/A Treatment Notes Electronic Signature(s) Signed: 12/09/2018 11:48:06 PM By: Beather Arbour FNP-C Previous Signature: 12/03/2018 4:38:01 PM Version By: Harold Barban Entered By: Beather Arbour on 12/09/2018 23:28:00 [name] ([id number]) -------------------------------------------------------------------------------- [place] Details Patient Name: [name] Date of Service: 12/03/2018 11:00 AM Medical Record Number: [id number] Patient Account Number: [id number] Date of Birth/Sex: December 31, 1922 (85 y.o. F) Treating RN: Harold Barban Primary Care Merideth Bosque: Grayland Ormond Other Clinician: Referring Saabir Blyth: Grayland Ormond Treating Lan Entsminger/Extender: Oneida Arenas in Treatment: 56 Active Inactive Abuse / Safety / Falls / Self Care Management Nursing Diagnoses: Potential for falls Goals: Patient will not experience any injury related to falls Date Initiated: 08/10/2017 Target Resolution Date: 11/10/2017 Goal Status: Active Interventions: Assess Activities of Daily Living upon admission and as needed Assess fall risk on admission and as needed Assess: immobility, friction, shearing, incontinence upon admission and as needed Notes: Nutrition Nursing Diagnoses: Imbalanced nutrition Potential for alteratiion in Nutrition/Potential for imbalanced nutrition Goals: Patient/caregiver agrees to and verbalizes understanding of need to use nutritional supplements and/or vitamins as prescribed Date Initiated: 08/10/2017 Target Resolution Date: 12/08/2017 Goal Status: Active Interventions: Assess patient nutrition upon admission and as needed per policy Notes: Orientation to the Wound Care Program Nursing Diagnoses: Knowledge deficit related to the wound healing center program Goals: Patient/caregiver will verbalize understanding of the [facility]  Program Date Initiated: 08/10/2017 Target Resolution Date: 09/08/2017 Goal Status: Active [name], [name] ([id number]) Interventions: Provide education on orientation to the wound center Notes: Pain, Acute or Chronic Nursing Diagnoses: Pain, acute or chronic: actual or potential Potential alteration in comfort, pain Goals: Patient/caregiver will verbalize adequate pain control between visits Date Initiated: 08/10/2017 Target Resolution Date: 12/08/2017 Goal Status: Active Interventions: Complete pain assessment as per visit requirements Notes: Wound/Skin Impairment Nursing Diagnoses: Impaired tissue integrity Knowledge deficit related to ulceration/compromised skin integrity Goals: Ulcer/skin breakdown will have a volume reduction of 80% by week 12 Date Initiated: 08/10/2017 Target Resolution Date: 12/01/2017 Goal Status: Active Interventions: Assess patient/caregiver ability to perform ulcer/skin care regimen upon admission and as needed Notes: Electronic Signature(s) Signed: 12/03/2018 4:38:01 PM By: Harold Barban Entered By: Harold Barban on 12/03/2018 11:12:12 Kuchenbecker, [name] ([id number]) -------------------------------------------------------------------------------- Pain Assessment Details Patient Name: [name] Date of Service: 12/03/2018 11:00 AM Medical Record Number: [id number] Patient Account Number: [id number] Date of Birth/Sex: [birth year] (85 y.o. F) Treating RN: Harold Barban Primary Care Aliany Fiorenza: Grayland Ormond Other Clinician: Referring Aidian Salomon: Grayland Ormond Treating Lenoria Narine/Extender: Oneida Arenas in Treatment: 68 Active Problems Location of Pain Severity and Description of Pain Patient Has Paino Yes Site Locations Rate the pain. Current Pain Level: 2 Pain Management and Medication Current Pain Management: Electronic Signature(s) Signed: 12/03/2018 4:38:01 PM By: Harold Barban Entered By: Harold Barban on  12/03/2018 10:53:04 Sciarra, [name] ([id number]) -------------------------------------------------------------------------------- Patient/Caregiver Education Details Patient Name: [name] Date of Service:  12/03/2018 11:00 AM Medical Record Number: [id number] Patient Account Number: [id number] Date of Birth/Gender: 1923-07-20 (85 y.o. F) Treating RN: Harold Barban Primary Care Physician: Grayland Ormond Other Clinician: Referring Physician: Grayland Ormond Treating Physician/Extender: Oneida Arenas in Treatment: 39 Education Assessment Education Provided To: Patient Education Topics Provided Electronic Signature(s) Signed: 12/03/2018 4:38:01 PM By: Harold Barban Entered By: Harold Barban on 12/03/2018 11:16:33 Cullers, [name] ([id number]) -------------------------------------------------------------------------------- Wound Assessment Details Patient Name: [name] Date of Service: 12/03/2018 11:00 AM Medical Record Number: [id number] Patient Account Number: [id number] Date of Birth/Sex: Jan 28, 1923 (85 y.o. F) Treating RN: Harold Barban Primary Care Kileen Lange: Grayland Ormond Other Clinician: Referring Emmagene Ortner: Grayland Ormond Treating Destin Vinsant/Extender: Beather Arbour Weeks in Treatment: 68 Wound Status Wound Number: 1 Primary Pressure Ulcer Etiology: Wound Location: Right Malleolus - Lateral Secondary Arterial Insufficiency Ulcer Wounding Event: Gradually Appeared Etiology: Date Acquired: 08/11/2015 Wound Status: Open Weeks Of Treatment: 68 Comorbid Cataracts, Congestive Heart Failure, Clustered Wound: No History: Hypertension, Osteoarthritis, Neuropathy Photos Photo Uploaded By: Harold Barban on 12/05/2018 07:37:17 Wound Measurements Length: (cm) 0.2 Width: (cm) 0.1 Depth: (cm) 0.1 Area: (cm) 0.016 Volume: (cm) 0.002 % Reduction in Area: 99% % Reduction in Volume: 99.6% Epithelialization: Medium  (34-66%) Tunneling: No Undermining: No Wound Description Classification: Category/Stage IV Wound Margin: Distinct, outline attached Exudate Amount: Small Exudate Type: Serous Exudate Color: amber Foul Odor After Cleansing: No Slough/Fibrino Yes Wound Bed Granulation Amount: None Present (0%) Exposed Structure Necrotic Amount: Small (1-33%) Fascia Exposed: No Necrotic Quality: Adherent Slough Fat Layer (Subcutaneous Tissue) Exposed: Yes Tendon Exposed: No Muscle Exposed: No Joint Exposed: No Bone Exposed: No Periwound Skin Texture Rauh, [name] ([id number]) Texture Color No Abnormalities Noted: No No Abnormalities Noted: No Callus: No Atrophie Blanche: No Crepitus: No Cyanosis: No Excoriation: No Ecchymosis: No Induration: No Erythema: No Rash: No Hemosiderin Staining: No Scarring: No Mottled: No Pallor: No Moisture Rubor: Yes No Abnormalities Noted: No Dry / Scaly: No Temperature / Pain Maceration: No Temperature: No Abnormality Tenderness on Palpation: Yes Wound Preparation Ulcer Cleansing: Rinsed/Irrigated with Saline Topical Anesthetic Applied: Other: lidocaine 4%, Electronic Signature(s) Signed: 12/03/2018 4:38:01 PM By: Harold Barban Entered By: Harold Barban on 12/03/2018 11:08:45 Chisholm, [name] ([id number]) -------------------------------------------------------------------------------- Wound Assessment Details Patient Name: [name] Date of Service: 12/03/2018 11:00 AM Medical Record Number: [id number] Patient Account Number: [id number] Date of Birth/Sex: 1923-02-09 (85 y.o. F) Treating RN: Harold Barban Primary Care [name]: Grayland Ormond Other Clinician: Referring Jawaan Adachi: Grayland Ormond Treating Grayland Daisey/Extender: Beather Arbour Weeks in Treatment: 68 Wound Status Wound Number: 2 Primary Pressure Ulcer Etiology: Wound Location: Left Malleolus - Lateral Secondary Arterial Insufficiency Ulcer Wounding Event:  Gradually Appeared Etiology: Date Acquired: 04/12/2018 Wound Status: Open Weeks Of Treatment: 32 Comorbid Cataracts, Congestive Heart Failure, Clustered Wound: No History: Hypertension, Osteoarthritis, Neuropathy Photos Photo Uploaded By: Harold Barban on 12/05/2018 07:37:40 Wound Measurements Length: (cm) 0.9 Width: (cm) 0.7 Depth: (cm) 0.6 Area: (cm) 0.495 Volume: (cm) 0.297 % Reduction in Area: 36.9% % Reduction in Volume: -275.9% Epithelialization: None Tunneling: Yes Position (o'clock): 5 Maximum Distance: (cm) 2 Undermining: No Wound Description Classification: Category/Stage IV Wound Margin: Epibole Exudate Amount: Medium Exudate Type: Serous Exudate Color: amber Foul Odor After Cleansing: No Slough/Fibrino Yes Wound Bed Granulation Amount: Small (1-33%) Exposed Structure Granulation Quality: Pink, Pale Fascia Exposed: No Necrotic Amount: Large (67-100%) Fat Layer (Subcutaneous Tissue) Exposed: Yes Necrotic Quality: Adherent Slough Tendon Exposed: No Muscle Exposed: No Joint Exposed: No Stefanick, [name] ([id number]) Bone Exposed: No Periwound Skin Texture Texture  Color No Abnormalities Noted: No No Abnormalities Noted: No Callus: No Atrophie Blanche: No Crepitus: No Cyanosis: No Excoriation: No Ecchymosis: No Induration: No Erythema: No Rash: No Hemosiderin Staining: No Scarring: No Mottled: No Pallor: No Moisture Rubor: No No Abnormalities Noted: No Dry / Scaly: No Temperature / Pain Maceration: Yes Temperature: No Abnormality Tenderness on Palpation: Yes Wound Preparation Ulcer Cleansing: Rinsed/Irrigated with Saline Topical Anesthetic Applied: Other: lidocaine 4%, Electronic Signature(s) Signed: 12/03/2018 4:38:01 PM By: Harold Barban Entered By: Harold Barban on 12/03/2018 11:42:25 Heyde, [name] ([id number]) -------------------------------------------------------------------------------- Wound Assessment  Details Patient Name: [name] Date of Service: 12/03/2018 11:00 AM Medical Record Number: [id number] Patient Account Number: [id number] Date of Birth/Sex: 08-11-1923 (85 y.o. F) Treating RN: Harold Barban Primary Care Davina Howlett: Grayland Ormond Other Clinician: Referring Sullivan Blasing: Grayland Ormond Treating Kennetta Pavlovic/Extender: Beather Arbour Weeks in Treatment: 68 Wound Status Wound Number: 3 Primary Pressure Ulcer Etiology: Wound Location: Left Calcaneus Wound Open Wounding Event: Gradually Appeared Status: Date Acquired: 10/04/2018 Comorbid Cataracts, Congestive Heart Failure, Weeks Of Treatment: 7 History: Hypertension, Osteoarthritis, Neuropathy Clustered Wound: No Photos Photo Uploaded By: Harold Barban on 12/05/2018 07:37:55 Wound Measurements Length: (cm) 1.8 Width: (cm) 2.4 Depth: (cm) 0.1 Area: (cm) 3.393 Volume: (cm) 0.339 % Reduction in Area: -23.4% % Reduction in Volume: 38.4% Epithelialization: None Tunneling: No Undermining: No Wound Description Classification: Category/Stage II Wound Margin: Indistinct, nonvisible Exudate Amount: Large Exudate Type: Serous Exudate Color: amber Foul Odor After Cleansing: No Slough/Fibrino Yes Wound Bed Granulation Amount: Small (1-33%) Exposed Structure Granulation Quality: Hyper-granulation Fascia Exposed: No Necrotic Amount: Large (67-100%) Fat Layer (Subcutaneous Tissue) Exposed: Yes Necrotic Quality: Eschar, Adherent Slough Tendon Exposed: No Muscle Exposed: No Joint Exposed: No Bone Exposed: No Periwound Skin Texture Tye, [name] ([id number]) Texture Color No Abnormalities Noted: No No Abnormalities Noted: No Callus: No Atrophie Blanche: No Crepitus: No Cyanosis: No Excoriation: No Ecchymosis: No Induration: No Erythema: No Rash: No Hemosiderin Staining: No Scarring: No Mottled: No Pallor: No Moisture Rubor: No No Abnormalities Noted: No Dry / Scaly: No Temperature /  Pain Maceration: Yes Temperature: No Abnormality Tenderness on Palpation: Yes Wound Preparation Ulcer Cleansing: Rinsed/Irrigated with Saline Topical Anesthetic Applied: Other: lidocaine 4%, Electronic Signature(s) Signed: 12/03/2018 4:38:01 PM By: Harold Barban Entered By: Harold Barban on 12/03/2018 11:09:16 Sickles, [name] ([id number]) -------------------------------------------------------------------------------- Wound Assessment Details Patient Name: [name] Date of Service: 12/03/2018 11:00 AM Medical Record Number: [id number] Patient Account Number: [id number] Date of Birth/Sex: 1923-03-14 (85 y.o. F) Treating RN: Harold Barban Primary Care Idonia Zollinger: Grayland Ormond Other Clinician: Referring Raza Bayless: Grayland Ormond Treating Kasin Tonkinson/Extender: Beather Arbour Weeks in Treatment: 68 Wound Status Wound Number: 4 Primary Pressure Ulcer Etiology: Wound Location: Toe Second - Distal Wound Open Wounding Event: Pressure Injury Status: Date Acquired: 10/23/2018 Comorbid Cataracts, Congestive Heart Failure, Weeks Of Treatment: 1 History: Hypertension, Osteoarthritis, Neuropathy Clustered Wound: No Photos Photo Uploaded By: Harold Barban on 12/05/2018 07:38:18 Wound Measurements Length: (cm) 0.7 Width: (cm) 0.8 Depth: (cm) 0.4 Area: (cm) 0.44 Volume: (cm) 0.176 % Reduction in Area: -519.7% % Reduction in Volume: -2414.3% Epithelialization: None Tunneling: No Undermining: Yes Starting Position (o'clock): 9 Ending Position (o'clock): 10 Maximum Distance: (cm) 1.2 Wound Description Classification: Category/Stage II Wound Margin: Distinct, outline attached Exudate Amount: Small Exudate Type: Serous Exudate Color: amber Foul Odor After Cleansing: No Slough/Fibrino Yes Wound Bed Granulation Amount: None Present (0%) Exposed Structure Necrotic Amount: Large (67-100%) Fascia Exposed: No Necrotic Quality: Eschar, Adherent Slough Fat  Layer (Subcutaneous Tissue) Exposed: Yes Tendon Exposed:  No Muscle Exposed: No Haros, [name] ([id number]) Joint Exposed: No Bone Exposed: No Periwound Skin Texture Texture Color No Abnormalities Noted: No No Abnormalities Noted: No Callus: Yes Atrophie Blanche: No Crepitus: No Cyanosis: No Excoriation: No Ecchymosis: No Induration: No Erythema: No Rash: No Hemosiderin Staining: No Scarring: No Mottled: No Pallor: No Moisture Rubor: No No Abnormalities Noted: No Dry / Scaly: Yes Temperature / Pain Maceration: No Temperature: No Abnormality Tenderness on Palpation: Yes Wound Preparation Ulcer Cleansing: Rinsed/Irrigated with Saline Topical Anesthetic Applied: Other: lidocaince 4%, Electronic Signature(s) Signed: 12/03/2018 4:38:01 PM By: Harold Barban Entered By: Harold Barban on 12/03/2018 11:41:50 Puig, [name] ([id number]) -------------------------------------------------------------------------------- Wound Assessment Details Patient Name: [name] Date of Service: 12/03/2018 11:00 AM Medical Record Number: [id number] Patient Account Number: [id number] Date of Birth/Sex: 1923-08-07 (85 y.o. F) Treating RN: Harold Barban Primary Care Arin Peral: Grayland Ormond Other Clinician: Referring Khai Arrona: Grayland Ormond Treating Alano Blasco/Extender: Beather Arbour Weeks in Treatment: 68 Wound Status Wound Number: 5 Primary Pressure Ulcer Etiology: Wound Location: Toe Great - Distal Wound Open Wounding Event: Footwear Injury Status: Date Acquired: 12/03/2018 Comorbid Cataracts, Congestive Heart Failure, Weeks Of Treatment: 0 History: Hypertension, Osteoarthritis, Neuropathy Clustered Wound: No Wound Measurements Length: (cm) 0.9 Width: (cm) 0.5 Depth: (cm) 0.1 Area: (cm) 0.353 Volume: (cm) 0.035 % Reduction in Area: % Reduction in Volume: Epithelialization: None Tunneling: No Undermining: No Wound Description Classification:  Category/Stage II Foul O Wound Margin: Flat and Intact Slough Exudate Amount: Small Exudate Type: Serosanguineous Exudate Color: red, brown dor After Cleansing: No /Fibrino No Wound Bed Granulation Amount: None Present (0%) Exposed Structure Necrotic Amount: None Present (0%) Fascia Exposed: No Fat Layer (Subcutaneous Tissue) Exposed: Yes Tendon Exposed: No Muscle Exposed: No Joint Exposed: No Bone Exposed: No Periwound Skin Texture Texture Color No Abnormalities Noted: No No Abnormalities Noted: No Callus: Yes Atrophie Blanche: No Crepitus: No Cyanosis: No Excoriation: No Ecchymosis: No Induration: No Erythema: No Rash: No Hemosiderin Staining: No Scarring: No Mottled: No Pallor: No Moisture Rubor: No No Abnormalities Noted: No Dry / Scaly: Yes Temperature / Pain Maceration: No Temperature: No Abnormality [place], [name] ([id number]) Wound Preparation Ulcer Cleansing: Rinsed/Irrigated with Saline Electronic Signature(s) Signed: 12/03/2018 4:38:01 PM By: Harold Barban Entered By: Harold Barban on 12/03/2018 11:48:22 [place]man, [name] ([id number]) -------------------------------------------------------------------------------- Vitals Details Patient Name: [name] Date of Service: 12/03/2018 11:00 AM Medical Record Number: [id number] Patient Account Number: [id number] Date of Birth/Sex: 04/12/1923 (85 y.o. F) Treating RN: Harold Barban Primary Care Jaedyn Lard: Grayland Ormond Other Clinician: Referring Asyria Kolander: Grayland Ormond Treating Saskia Simerson/Extender: Oneida Arenas in Treatment: 68 Vital Signs Time Taken: 10:54 Temperature (F): 98.5 Height (in): 65 Pulse (bpm): 87 Weight (lbs): 154.3 Respiratory Rate (breaths/min): 16 Body Mass Index (BMI): 25.7 Blood Pressure (mmHg): 127/56 Reference Range: 80 - 120 mg / dl Electronic Signature(s) Signed: 12/03/2018 4:38:01 PM By: Harold Barban Entered By: Harold Barban on  12/03/2018 10:55:33

## 2018-12-05 NOTE — Progress Notes (Signed)
[name], [name] ([id number]) Visit Report for 12/03/2018 Debridement Details Patient Name: [name], [name]. Date of Service: 12/03/2018 11:00 AM Medical Record Number: [id number] Patient Account Number: [id number] Date of Birth/Sex: 1923/03/05 (85 y.o. F) Treating RN: Harold Barban Primary Care Provider: Grayland Ormond Other Clinician: Referring Provider: Grayland Ormond Treating Provider/Extender: Oneida Arenas in Treatment: 68 Debridement Performed for Wound #1 Right,Lateral Malleolus Assessment: Performed By: Physician Beather Arbour, FNP Debridement Type: Debridement Severity of Tissue Pre Fat layer exposed Debridement: Level of Consciousness (Pre- Awake and Alert procedure): Pre-procedure Verification/Time Yes - 11:20 Out Taken: Start Time: 11:20 Pain Control: Lidocaine Total Area Debrided (L x W): 0.2 (cm) x 0.1 (cm) = 0.02 (cm) Tissue and other material Non-Viable, Callus debrided: Level: Non-Viable Tissue Debridement Description: Selective/Open Wound Instrument: Curette Bleeding: None End Time: 11:23 Procedural Pain: 0 Post Procedural Pain: 0 Response to Treatment: Procedure was tolerated well Level of Consciousness Awake and Alert (Post-procedure): Post Debridement Measurements of Total Wound Length: (cm) 0.2 Stage: Category/Stage IV Width: (cm) 0.2 Depth: (cm) 0.1 Volume: (cm) 0.003 Character of Wound/Ulcer Post Improved Debridement: Severity of Tissue Post Debridement: Fat layer exposed Post Procedure Diagnosis Same as Pre-procedure Electronic Signature(s) Signed: 12/03/2018 4:38:01 PM By: Harold Barban Signed: 12/05/2018 12:47:52 AM By: Beather Arbour FNP-C Entered By: Harold Barban on 12/03/2018 11:23:14 [place]uss, [name] ([id number]) Joni Fears, [name] -------------------------------------------------------------------------------- Debridement Details Patient Name: [name] Date of Service:  12/03/2018 11:00 AM Medical Record Number: [id number] Patient Account Number: [id number] Date of Birth/Sex: 06/04/1923 (85 y.o. F) Treating RN: Harold Barban Primary Care Provider: Grayland Ormond Other Clinician: Referring Provider: Grayland Ormond Treating Provider/Extender: Oneida Arenas in Treatment: 68 Debridement Performed for Wound #4 Distal Toe Second Assessment: Performed By: Physician Beather Arbour, FNP Debridement Type: Debridement Level of Consciousness (Pre- Awake and Alert procedure): Pre-procedure Verification/Time Yes - 11:20 Out Taken: Start Time: 11:20 Pain Control: Lidocaine Total Area Debrided (L x W): 0.7 (cm) x 0.8 (cm) = 0.56 (cm) Tissue and other material Viable, Non-Viable, Callus, Slough, Subcutaneous, Slough debrided: Level: Skin/Subcutaneous Tissue Debridement Description: Excisional Instrument: Curette Bleeding: None End Time: 11:23 Procedural Pain: 0 Post Procedural Pain: 0 Response to Treatment: Procedure was tolerated well Level of Consciousness Awake and Alert (Post-procedure): Post Debridement Measurements of Total Wound Length: (cm) 0.9 Stage: Category/Stage II Width: (cm) 1.1 Depth: (cm) 0.4 Volume: (cm) 0.311 Character of Wound/Ulcer Post Improved Debridement: Post Procedure Diagnosis Same as Pre-procedure Electronic Signature(s) Signed: 12/03/2018 4:38:01 PM By: Harold Barban Signed: 12/05/2018 12:47:52 AM By: Beather Arbour FNP-C Entered By: Harold Barban on 12/03/2018 11:38:19 [name], [name] ([id number]) -------------------------------------------------------------------------------- Debridement Details Patient Name: [name] Date of Service: 12/03/2018 11:00 AM Medical Record Number: [id number] Patient Account Number: [id number] Date of Birth/Sex: 29-Jan-1923 (85 y.o. F) Treating RN: Harold Barban Primary Care Provider: Grayland Ormond Other Clinician: Referring Provider: Grayland Ormond Treating Provider/Extender: Oneida Arenas in Treatment: 68 Debridement Performed for Wound #2 Left,Lateral Malleolus Assessment: Performed By: Physician Beather Arbour, FNP Debridement Type: Debridement Severity of Tissue Pre Fat layer exposed Debridement: Level of Consciousness (Pre- Awake and Alert procedure): Pre-procedure Verification/Time Yes - 11:20 Out Taken: Start Time: 11:20 Pain Control: Lidocaine Total Area Debrided (L x W): 0.4 (cm) x 0.3 (cm) = 0.12 (cm) Tissue and other material Non-Viable, Slough, Slough debrided: Level: Non-Viable Tissue Debridement Description: Selective/Open Wound Instrument: Curette Bleeding: None End Time: 11:23 Procedural Pain: 0 Post Procedural Pain: 0 Response to Treatment: Procedure was tolerated well Level of Consciousness Awake and  Alert (Post-procedure): Post Debridement Measurements of Total Wound Length: (cm) 0.9 Stage: Category/Stage IV Width: (cm) 0.7 Depth: (cm) 0.6 Volume: (cm) 0.297 Character of Wound/Ulcer Post Improved Debridement: Severity of Tissue Post Debridement: Fat layer exposed Post Procedure Diagnosis Same as Pre-procedure Electronic Signature(s) Signed: 12/03/2018 4:38:01 PM By: Harold Barban Signed: 12/05/2018 12:47:52 AM By: Beather Arbour FNP-C Entered By: Harold Barban on 12/03/2018 11:40:22 [name], [name] ([id number]) -------------------------------------------------------------------------------- Debridement Details Patient Name: [name] Date of Service: 12/03/2018 11:00 AM Medical Record Number: [id number] Patient Account Number: [id number] Date of Birth/Sex: [birth year] (85 y.o. F) Treating RN: Harold Barban Primary Care Provider: Grayland Ormond Other Clinician: Referring Provider: Grayland Ormond Treating Provider/Extender: Oneida Arenas in Treatment: 68 Debridement Performed for Wound #3 Left Calcaneus Assessment: Performed By:  Physician Beather Arbour, FNP Debridement Type: Debridement Level of Consciousness (Pre- Awake and Alert procedure): Pre-procedure Verification/Time Yes - 11:20 Out Taken: Start Time: 11:20 Pain Control: Lidocaine Total Area Debrided (L x W): 1.8 (cm) x 2.4 (cm) = 4.32 (cm) Tissue and other material Viable, Non-Viable, Callus, Slough, Slough debrided: Level: Non-Viable Tissue Debridement Description: Selective/Open Wound Instrument: Curette Bleeding: None End Time: 11:23 Procedural Pain: 0 Post Procedural Pain: 0 Response to Treatment: Procedure was tolerated well Level of Consciousness Awake and Alert (Post-procedure): Post Debridement Measurements of Total Wound Length: (cm) 1.8 Stage: Category/Stage II Width: (cm) 2.4 Depth: (cm) 0.1 Volume: (cm) 0.339 Character of Wound/Ulcer Post Improved Debridement: Post Procedure Diagnosis Same as Pre-procedure Electronic Signature(s) Signed: 12/03/2018 4:38:01 PM By: Harold Barban Signed: 12/05/2018 12:47:52 AM By: Beather Arbour FNP-C Entered By: Harold Barban on 12/03/2018 11:53:38 [name], [name] ([id number]) -------------------------------------------------------------------------------- Physician Orders Details Patient Name: [name] Date of Service: 12/03/2018 11:00 AM Medical Record Number: [id number] Patient Account Number: [id number] Date of Birth/Sex: 09-09-1923 (85 y.o. F) Treating RN: Harold Barban Primary Care Provider: Grayland Ormond Other Clinician: Referring Provider: Grayland Ormond Treating Provider/Extender: Oneida Arenas in Treatment: 29 Verbal / Phone Orders: No Diagnosis Coding Wound Cleansing Wound #1 Right,Lateral Malleolus o Clean wound with Normal Saline. o Cleanse wound with mild soap and water o May Shower, gently pat wound dry prior to applying new dressing. Wound #2 Left,Lateral Malleolus o Clean wound with Normal Saline. o Cleanse wound with  mild soap and water o May Shower, gently pat wound dry prior to applying new dressing. Wound #3 Left Calcaneus o Clean wound with Normal Saline. o Cleanse wound with mild soap and water o May Shower, gently pat wound dry prior to applying new dressing. Wound #4 Distal Toe Second o Clean wound with Normal Saline. o Cleanse wound with mild soap and water o May Shower, gently pat wound dry prior to applying new dressing. Anesthetic (add to Medication List) Wound #1 Right,Lateral Malleolus o Topical Lidocaine 4% cream applied to wound bed prior to debridement (In Clinic Only). Wound #2 Left,Lateral Malleolus o Topical Lidocaine 4% cream applied to wound bed prior to debridement (In Clinic Only). Wound #3 Left Calcaneus o Topical Lidocaine 4% cream applied to wound bed prior to debridement (In Clinic Only). Wound #4 Distal Toe Second o Topical Lidocaine 4% cream applied to wound bed prior to debridement (In Clinic Only). Primary Wound Dressing Wound #1 Right,Lateral Malleolus o Silver Collagen Wound #2 Left,Lateral Malleolus o Silver Alginate Wound #3 Left Calcaneus o Silver Collagen Wound #4 Distal Toe Second [name], [name] ([id number]) o Silver Collagen - Undermining from9-10 at 1.2 cm, please places dressing into tunnel, do not overfill. Secondary Dressing  Wound #1 Right,Lateral Malleolus o Boardered Foam Dressing Wound #2 Left,Lateral Malleolus o Boardered Foam Dressing - Tunnel at 5 o'clock 2 cm, fill tunnel with dressing, do not overfill. Thank you Wound #3 Left Calcaneus o Boardered Foam Dressing Wound #4 Distal Toe Second o Conform/Kerlix Dressing Change Frequency Wound #1 Right,Lateral Malleolus o Change Dressing Monday, Wednesday, Friday Wound #2 Left,Lateral Malleolus o Change Dressing Monday, Wednesday, Friday Wound #3 Left Calcaneus o Change Dressing Monday, Wednesday, Friday Wound #4 Distal Toe Second o Change  Dressing Monday, Wednesday, Friday Follow-up Appointments Wound #1 Right,Lateral Malleolus o Return Appointment in 2 weeks. - Return 12-03-2018 Wound #2 Left,Lateral Malleolus o Return Appointment in 2 weeks. - Return 12-03-2018 Wound #3 Left Calcaneus o Return Appointment in 2 weeks. - Return 12-03-2018 Wound #4 Distal Toe Second o Return Appointment in 2 weeks. - Return 12-03-2018 Edema Control Wound #1 Right,Lateral Malleolus o Patient to wear own compression stockings o Patient to wear own Velcro compression garment. o Elevate legs to the level of the heart and pump ankles as often as possible Wound #2 Left,Lateral Malleolus o Patient to wear own compression stockings o Patient to wear own Velcro compression garment. o Elevate legs to the level of the heart and pump ankles as often as possible Wound #3 Left Calcaneus o Patient to wear own compression stockings o Patient to wear own Velcro compression garment. o Elevate legs to the level of the heart and pump ankles as often as possible [name], [name] ([id number]) Wound #4 Distal Toe Second o Patient to wear own compression stockings o Patient to wear own Velcro compression garment. o Elevate legs to the level of the heart and pump ankles as often as possible Off-Loading Wound #1 Right,Lateral Malleolus o Turn and reposition every 2 hours o Other: - do not put pressure on your ankles Wound #2 Left,Lateral Malleolus o Turn and reposition every 2 hours o Other: - do not put pressure on your ankles Wound #3 Left Calcaneus o Turn and reposition every 2 hours o Other: - do not put pressure on your ankles Wound #4 Distal Toe Second o Turn and reposition every 2 hours o Other: - do not put pressure on your ankles Additional Orders / Instructions Wound #1 Right,Lateral Malleolus o Increase protein intake. o Increase protein intake. Wound #2 Left,Lateral Malleolus o  Increase protein intake. o Increase protein intake. Wound #3 Left Calcaneus o Increase protein intake. o Increase protein intake. Wound #4 Distal Toe Second o Increase protein intake. o Increase protein intake. Home Health Wound #1 [place] Visits - [facility]. Patient will return 12-31. Please visit patient Monday, Wednesday, and Friday o Home Health Nurse may visit PRN to address patientos wound care needs. o FACE TO FACE ENCOUNTER: MEDICARE and MEDICAID PATIENTS: I certify that this patient is under my care and that I had a face-to-face encounter that meets the physician face-to-face encounter requirements with this patient on this date. The encounter with the patient was in whole or in part for the following MEDICAL CONDITION: (primary reason for [place]) MEDICAL NECESSITY: I certify, that based on my findings, NURSING services are a medically necessary home health service. HOME BOUND STATUS: I certify that my clinical findings support that this patient is homebound (i.e., Due to illness or injury, pt requires aid of supportive devices such as crutches, cane, wheelchairs, walkers, the use of special transportation or the assistance of another person to leave their place of residence. There is  a normal inability to leave the home and doing so requires considerable and taxing effort. Other absences are for medical reasons / religious services and are infrequent or of short duration when for other reasons). [name], [name] ([id number]) o If current dressing causes regression in wound condition, may D/C ordered dressing product/s and apply Normal Saline Moist Dressing daily until next [place] / Other MD appointment. [place] of regression in wound condition at [phone]. o Please direct any NON-WOUND related issues/requests for orders to patient's Primary Care Physician Wound #2  [place] Visits - [facility]. Patient will return 12-31. Please visit patient Monday, Wednesday, and Friday o Home Health Nurse may visit PRN to address patientos wound care needs. o FACE TO FACE ENCOUNTER: MEDICARE and MEDICAID PATIENTS: I certify that this patient is under my care and that I had a face-to-face encounter that meets the physician face-to-face encounter requirements with this patient on this date. The encounter with the patient was in whole or in part for the following MEDICAL CONDITION: (primary reason for [place]) MEDICAL NECESSITY: I certify, that based on my findings, NURSING services are a medically necessary home health service. HOME BOUND STATUS: I certify that my clinical findings support that this patient is homebound (i.e., Due to illness or injury, pt requires aid of supportive devices such as crutches, cane, wheelchairs, walkers, the use of special transportation or the assistance of another person to leave their place of residence. There is a normal inability to leave the home and doing so requires considerable and taxing effort. Other absences are for medical reasons / religious services and are infrequent or of short duration when for other reasons). o If current dressing causes regression in wound condition, may D/C ordered dressing product/s and apply Normal Saline Moist Dressing daily until next [place] / Other MD appointment. [place] of regression in wound condition at [phone]. o Please direct any NON-WOUND related issues/requests for orders to patient's Primary Care Physician Wound #3 Left [place] Visits - [facility]. Patient will return 12-31. Please visit patient Monday, Wednesday, and Friday o Home Health Nurse may visit PRN to address patientos wound care needs. o FACE TO FACE ENCOUNTER: MEDICARE and MEDICAID PATIENTS: I certify that  this patient is under my care and that I had a face-to-face encounter that meets the physician face-to-face encounter requirements with this patient on this date. The encounter with the patient was in whole or in part for the following MEDICAL CONDITION: (primary reason for [place]) MEDICAL NECESSITY: I certify, that based on my findings, NURSING services are a medically necessary home health service. HOME BOUND STATUS: I certify that my clinical findings support that this patient is homebound (i.e., Due to illness or injury, pt requires aid of supportive devices such as crutches, cane, wheelchairs, walkers, the use of special transportation or the assistance of another person to leave their place of residence. There is a normal inability to leave the home and doing so requires considerable and taxing effort. Other absences are for medical reasons / religious services and are infrequent or of short duration when for other reasons). o If current dressing causes regression in wound condition, may D/C ordered dressing product/s and apply Normal Saline Moist Dressing daily until next [place] / Other MD appointment. [place] of regression in wound condition at [phone]. o Please direct any NON-WOUND related issues/requests for orders to  patient's Primary Care Physician Wound #4 Distal Toe [place] Visits - [facility]. Patient will return 12-31. Please visit patient Monday, Wednesday, and Friday o Home Health Nurse may visit PRN to address patientos wound care needs. o FACE TO FACE ENCOUNTER: MEDICARE and MEDICAID PATIENTS: I certify that this patient is under my care and that I had a face-to-face encounter that meets the physician face-to-face encounter requirements with this patient on this date. The encounter with the patient was in whole or in part for the following MEDICAL CONDITION: (primary reason for [place])  MEDICAL NECESSITY: I certify, that based on my findings, NURSING services are a medically necessary home health service. HOME BOUND STATUS: I certify that my clinical findings support that this patient is homebound (i.e., Due to illness or injury, pt requires aid of supportive devices such as crutches, cane, wheelchairs, walkers, the use of special transportation or the assistance of another person to leave their place of residence. There is a normal inability to leave the home and doing so requires considerable and taxing effort. Other absences are for medical reasons / religious services and are infrequent or of short duration when for other reasons). [name], [name] ([id number]) o If current dressing causes regression in wound condition, may D/C ordered dressing product/s and apply Normal Saline Moist Dressing daily until next [place] / Other MD appointment. [place] of regression in wound condition at [phone]. o Please direct any NON-WOUND related issues/requests for orders to patient's Primary Care Physician Electronic Signature(s) Signed: 12/03/2018 4:38:01 PM By: Harold Barban Signed: 12/05/2018 12:47:52 AM By: Beather Arbour FNP-C Entered By: Harold Barban on 12/03/2018 12:08:38

## 2018-12-10 ENCOUNTER — Ambulatory Visit (INDEPENDENT_AMBULATORY_CARE_PROVIDER_SITE_OTHER): Payer: Medicare Other

## 2018-12-10 ENCOUNTER — Ambulatory Visit (INDEPENDENT_AMBULATORY_CARE_PROVIDER_SITE_OTHER): Payer: Medicare Other | Admitting: Internal Medicine

## 2018-12-10 ENCOUNTER — Encounter (INDEPENDENT_AMBULATORY_CARE_PROVIDER_SITE_OTHER): Payer: Self-pay | Admitting: Vascular Surgery

## 2018-12-10 ENCOUNTER — Ambulatory Visit (INDEPENDENT_AMBULATORY_CARE_PROVIDER_SITE_OTHER): Payer: Medicare Other | Admitting: Vascular Surgery

## 2018-12-10 ENCOUNTER — Telehealth: Payer: Self-pay

## 2018-12-10 ENCOUNTER — Encounter: Payer: Self-pay | Admitting: Internal Medicine

## 2018-12-10 VITALS — BP 141/80 | HR 83 | Resp 16 | Ht 64.0 in | Wt 147.4 lb

## 2018-12-10 DIAGNOSIS — I7025 Atherosclerosis of native arteries of other extremities with ulceration: Secondary | ICD-10-CM

## 2018-12-10 DIAGNOSIS — Z452 Encounter for adjustment and management of vascular access device: Secondary | ICD-10-CM

## 2018-12-10 DIAGNOSIS — I83023 Varicose veins of left lower extremity with ulcer of ankle: Secondary | ICD-10-CM

## 2018-12-10 DIAGNOSIS — I480 Paroxysmal atrial fibrillation: Secondary | ICD-10-CM

## 2018-12-10 DIAGNOSIS — M86672 Other chronic osteomyelitis, left ankle and foot: Secondary | ICD-10-CM

## 2018-12-10 DIAGNOSIS — E785 Hyperlipidemia, unspecified: Secondary | ICD-10-CM | POA: Diagnosis not present

## 2018-12-10 DIAGNOSIS — L97329 Non-pressure chronic ulcer of left ankle with unspecified severity: Secondary | ICD-10-CM

## 2018-12-10 DIAGNOSIS — R110 Nausea: Secondary | ICD-10-CM | POA: Diagnosis not present

## 2018-12-10 DIAGNOSIS — L97309 Non-pressure chronic ulcer of unspecified ankle with unspecified severity: Secondary | ICD-10-CM

## 2018-12-10 NOTE — Progress Notes (Signed)
MRN : [id number]  [name] is a 85 y.o. (July 14, 1923) female who presents with chief complaint of  Chief Complaint  Patient presents with  . Follow-up  .  History of Present Illness: Patient returns today in follow up of her PAD and ulceration bilaterally.  Her right leg ulcers are small and she reports basically healed.  At this time, she is about 6 weeks status post percutaneous intervention of the left lower extremity for nonhealing wounds.  She is going to the wound center.  Her wounds are significantly improved but not healed. Her noninvasive studies today demonstrate a right ABI of 1.08 and a left ABI that is now increased to 1.27.  Before her intervention her ABI was only 0.8.  Also, her left digital pressure is now up over 100 at 109.  Current Outpatient Medications  Medication Sig Dispense Refill  . acetaminophen (TYLENOL) 650 MG CR tablet Take 650 mg by mouth every 8 (eight) hours as needed for pain.    Marland Kitchen aspirin EC 81 MG tablet Take 81 mg by mouth daily.    . Biotin 1000 MCG tablet Take 1,000 mcg by mouth daily.    . calcium carbonate (OSCAL) 1500 (600 Ca) MG TABS tablet Take 600 mg of elemental calcium by mouth daily.    Marland Kitchen CARTIA XT 300 MG 24 hr capsule Take 300 mg by mouth at bedtime.     Marland Kitchen ceFEPIme (MAXIPIME) 2 g injection 2 (two) times daily. Given via PICC line on right arm    . Cholecalciferol (VITAMIN D) 2000 units CAPS Take 2,000 Units by mouth daily.    . cyanocobalamin 1000 MCG tablet Take 1,000 mcg by mouth daily.    . dabigatran (PRADAXA) 150 MG CAPS capsule Take 150 mg by mouth 2 (two) times daily.    . furosemide (LASIX) 20 MG tablet Take 20 mg by mouth daily.     Marland Kitchen levothyroxine (SYNTHROID, LEVOTHROID) 50 MCG tablet Take 50 mcg by mouth daily before breakfast.     . metoprolol (LOPRESSOR) 50 MG tablet Take 50 mg by mouth 2 (two) times daily.    . Multiple Vitamins-Minerals (PRESERVISION/LUTEIN PO) Take 1 capsule by mouth daily.    . NONFORMULARY OR  COMPOUNDED ITEM See pharmacy note 120 each 2  . nystatin (MYCOSTATIN/NYSTOP) powder Apply topically.    Marland Kitchen omeprazole (PRILOSEC) 40 MG capsule Take 40 mg by mouth daily.     . potassium chloride SA (K-DUR,KLOR-CON) 20 MEQ tablet Take 20 mEq by mouth daily.    Marland Kitchen tolterodine (DETROL) 2 MG tablet Take 2 mg by mouth daily as needed (bladder control).     . traMADol (ULTRAM) 50 MG tablet Take 50 mg by mouth 2 (two) times daily.     . vitamin A 10000 UNIT capsule Take 10,000 Units by mouth daily.    . vitamin C (ASCORBIC ACID) 500 MG tablet Take 1,000 mg by mouth daily.    . Zinc 50 MG CAPS Take by mouth daily.     No current facility-administered medications for this visit.     Past Medical History:  Diagnosis Date  . Anemia   . Dysrhythmia 2010   has pacemaker  . GERD (gastroesophageal reflux disease)   . HOH (hard of hearing)    can not hear at all in right ear. does not wear hearing aides  . Hypothyroidism   . Peripheral vascular disease ([place])   . Pneumonia 2010   had collapsed lung  .  Presence of permanent cardiac pacemaker    2010  . Skin cancer, basal cell    skin cancer   . Stroke ([place]) 2000   mini, per daughter. had blood clot in right ear  . Varicose veins     Past Surgical History:  Procedure Laterality Date  . ANGIOPLASTY Right 2015   unsure if stent was placed. dr  performed procedure  . APPENDECTOMY    . BASAL CELL CARCINOMA EXCISION Left   . BREAST CYST EXCISION Left   . CATARACT EXTRACTION Left   . CHOLECYSTECTOMY    . EYE SURGERY Bilateral 2011, 2014   cataract surgery  . LOWER EXTREMITY ANGIOGRAPHY Left 11/11/2018   Procedure: LOWER EXTREMITY ANGIOGRAPHY;  Surgeon: Algernon Huxley, MD;  Location: [name];  Service: Cardiovascular;  Laterality: Left;  . PACEMAKER PLACEMENT    . PERIPHERAL VASCULAR CATHETERIZATION Right 10/09/2016   Procedure: Lower Extremity Angiography;  Surgeon: Algernon Huxley, MD;  Location: [facility];  Service:  Cardiovascular;  Laterality: Right;  . PERIPHERAL VASCULAR CATHETERIZATION  10/09/2016   Procedure: Lower Extremity Intervention;  Surgeon: Algernon Huxley, MD;  Location: [facility];  Service: Cardiovascular;;  . TONSILLECTOMY     Family History  Problem Relation Age of Onset  . Stroke Mother   . Cancer Father    No bleeding or clotting disorders  Social History     Social History  Substance Use Topics  . Smoking status: Never Smoker  . Smokeless tobacco: Not on file  . Alcohol use No  Recently widowed      Allergies  Allergen Reactions  . Metronidazole Nausea And Vomiting, Other (See Comments) and Rash  . Monistat [Tioconazole] Rash and Other (See Comments)  . Sulfa Antibiotics Rash and Nausea And Vomiting             REVIEW OF SYSTEMS(Negative unless checked)  Constitutional: [] ?Weight loss[] ?Fever[] ?Chills Cardiac:[] ?Chest pain[] ?Chest pressure[x] ?Palpitations [] ?Shortness of breath when laying flat [] ?Shortness of breath at rest [] ?Shortness of breath with exertion. Vascular: [] ?Pain in legs with walking[] ?Pain in legsat rest[] ?Pain in legs when laying flat [] ?Claudication [] ?Pain in feet when walking [] ?Pain in feet at rest [] ?Pain in feet when laying flat [] ?History of DVT [] ?Phlebitis [x] ?Swelling in legs [x] ?Varicose veins [x] ?Non-healing ulcers Pulmonary: [] ?Uses home oxygen [] ?Productive cough[] ?Hemoptysis [] ?Wheeze [] ?COPD [] ?Asthma Neurologic: [] ?Dizziness [] ?Blackouts [] ?Seizures [] ?History of stroke [] ?History of TIA[] ?Aphasia [] ?Temporary blindness[] ?Dysphagia [] ?Weaknessor numbness in arms [] ?Weakness or numbnessin legs Musculoskeletal: [] ?Arthritis [] ?Joint swelling [] ?Joint pain [] ?Low back pain Hematologic:[] ?Easy bruising[] ?Easy bleeding [] ?Hypercoagulable state [] ?Anemic [] ?Hepatitis Gastrointestinal:[] ?Blood in stool[] ?Vomiting  blood[] ?Gastroesophageal reflux/heartburn[] ?Abdominal pain Genitourinary: [] ?Chronic kidney disease [] ?Difficulturination [] ?Frequenturination [] ?Burning with urination[] ?Hematuria Skin: [] ?Rashes [x] ?Ulcers [x] ?Wounds Psychological: [] ?History of anxiety[] ?History of major depression.    Physical Examination  BP (!) 141/80 (BP Location: Left Arm, Patient Position: Sitting)   Pulse 83   Resp 16   Ht 5\' 4"  (1.626 m)   Wt 147 lb 6.4 oz (66.9 kg)   BMI 25.30 kg/m  Gen:  WD/WN, NAD.  Appears younger than stated age Head: [place]/AT, No temporalis wasting. Ear/Nose/Throat: Hearing diminished, nares w/o erythema or drainage Eyes: Conjunctiva clear. Sclera non-icteric Neck: Supple.  Trachea midline Pulmonary:  Good air movement, no use of accessory muscles.  Cardiac: RRR, no JVD Vascular:  Vessel Right Left  Radial Palpable Palpable                          PT 1+ Palpable 1+ Palpable  DP  Palpable Palpable    Musculoskeletal: M/S 5/5 throughout.  No deformity or atrophy.  Trace right lower extremity edema, 1+ left lower extremity edema.  Wound on the right lateral ankle dressed which she says is basically just a small scab at this point.  Wound on the left heel and the left lateral ankle are also dressed today. Neurologic: Sensation grossly intact in extremities.  Symmetrical.  Speech is fluent.  Psychiatric: Judgment intact, Mood & affect appropriate for pt's clinical situation. Dermatologic: Bilateral feet wounds as described below.       Labs Recent Results (from the past 2160 hour(s))  CBC with Differential/Platelet     Status: Abnormal   Collection Time: 09/25/18 10:55 AM  Result Value Ref Range   WBC 10.1 3.8 - 10.8 Thousand/uL   RBC 3.98 3.80 - 5.10 Million/uL   Hemoglobin 10.9 (L) 11.7 - 15.5 g/dL   HCT 33.6 (L) 35.0 - 45.0 %   MCV 84.4 80.0 - 100.0 fL   MCH 27.4 27.0 - 33.0 pg   MCHC 32.4 32.0 - 36.0 g/dL   RDW 15.1 (H) 11.0 - 15.0 %    Platelets 410 (H) 140 - 400 Thousand/uL   MPV 10.4 7.5 - 12.5 fL   Neutro Abs 7,141 1,500 - 7,800 cells/uL   Lymphs Abs 1,141 850 - 3,900 cells/uL   WBC mixed population 1,364 (H) 200 - 950 cells/uL   Eosinophils Absolute 424 15 - 500 cells/uL   Basophils Absolute 30 0 - 200 cells/uL   Neutrophils Relative % 70.7 %   Total Lymphocyte 11.3 %   Monocytes Relative 13.5 %   Eosinophils Relative 4.2 %   Basophils Relative 0.3 %  Comprehensive metabolic panel     Status: Abnormal   Collection Time: 09/25/18 10:55 AM  Result Value Ref Range   Glucose, Bld 149 (H) 65 - 99 mg/dL    Comment: .            Fasting reference interval . For someone without known diabetes, a glucose value >125 mg/dL indicates that they may have diabetes and this should be confirmed with a follow-up test. .    BUN 21 7 - 25 mg/dL   Creat 1.03 (H) 0.60 - 0.88 mg/dL    Comment: For patients >7 years of age, the reference limit for Creatinine is approximately 13% higher for people identified as African-American. .    BUN/Creatinine Ratio 20 6 - 22 (calc)   Sodium 139 135 - 146 mmol/L   Potassium 4.7 3.5 - 5.3 mmol/L   Chloride 105 98 - 110 mmol/L   CO2 24 20 - 32 mmol/L   Calcium 9.3 8.6 - 10.4 mg/dL   Total Protein 7.3 6.1 - 8.1 g/dL   Albumin 3.6 3.6 - 5.1 g/dL   Globulin 3.7 1.9 - 3.7 g/dL (calc)   AG Ratio 1.0 1.0 - 2.5 (calc)   Total Bilirubin 0.4 0.2 - 1.2 mg/dL   Alkaline phosphatase (APISO) 86 33 - 130 U/L   AST 24 10 - 35 U/L   ALT 13 6 - 29 U/L  Sedimentation rate     Status: Abnormal   Collection Time: 09/25/18 10:55 AM  Result Value Ref Range   Sed Rate 46 (H) 0 - 30 mm/h  C-reactive protein     Status: Abnormal   Collection Time: 09/25/18 10:55 AM  Result Value Ref Range   CRP 22.1 (H) <8.0 mg/L  BUN     Status: Abnormal  Collection Time: 11/08/18  1:30 PM  Result Value Ref Range   BUN 34 (H) 8 - 23 mg/dL    Comment: Performed at [facility], [address]  [place], [place] [address]  Creatinine, serum     Status: Abnormal   Collection Time: 11/08/18  1:30 PM  Result Value Ref Range   Creatinine, Ser 0.97 0.44 - 1.00 mg/dL   GFR calc non Af Amer 50 (L) >60 mL/min   GFR calc Af Amer 58 (L) >60 mL/min    Comment: Performed at [facility], [address], [place], [place] [address]    Radiology No results found.  Assessment/Plan Paroxysmal atrial fibrillation (HCC) On anticoagulation  Varicose veins of lower extremities with ulcer (HCC) Swelling better. Continue compression stockings and elevation  HLD (hyperlipidemia) lipid control important in reducing the progression of atherosclerotic disease. Continue statin therapy  Atherosclerosis of native arteries of the extremities with ulceration ([place]) Her noninvasive studies today demonstrate a right ABI of 1.08 and a left ABI that is now increased to 1.27.  Before her intervention her ABI was only 0.8.  Also, her left digital pressure is now up over 100 at 109.  This is a marked improvement and should give [place] a pretty good chance of wound healing.  At this point, I would not recommend any intervention or invasive therapy.  I will see keep a relatively short interval follow-up of 3 months at this time given her high risk status with longstanding wounds.    Leotis Pain, MD  12/10/2018 4:48 PM    This note was created with Dragon medical transcription system.  Any errors from dictation are purely unintentional

## 2018-12-10 NOTE — Progress Notes (Signed)
Patient ID: [name], female   DOB: [birth year], 85 y.o.   MRN: [id number]   Subjective:    Patient ID: [name], female    DOB: 12-01-1923, 85 y.o.   MRN: [id number]  HPI Here for follow up of ostemyelitis.  She was intially referred due to concern for osteomyelitis and ended up in the hospital with non-healing left lateral malleolus ulcer.  A recent bone culture done at wound care by Dr. Grayling Congress with MSSA.  In the hospital she was noted to have cellulitis and swelling and put on IV cefazolin and improved some and sent out on Keflex.  She though has continued the Augmentin since she still had it.  She underwent a CT evaluation of the left malleolus which was concerning for osteomyleitis with lytic areas on the bone and exposure of the bone.  She is known to have poor vasculature overall.   I started her on Zyvox which she took for 2 weeks.  She has significant side effects including some loose stools, less than 5/day, significant fatigue and malaise she feelt was associated with her antibiotic.   I then started her on Keflex and took for about 1 month.  Her labs had been reasurring and ulcer healing and stopped antibiotics.  She though came back with continued non-healing ulcer that probes to bone of the left lateral malleolus.  She has a hsitory of MSSA culture as well as Pseudomonas, pansensitive.  She also sees vascular surgery and underwent treatment for her stenosis to help with healing.  Started on Cefepime about 11/25 and on it since.  Some nausea still.    Now has completed 6 weeks and wound healing over, smaller by their report.     Objective:    PE:  Constitutional: alert, nad Vitals:   12/10/18 1057  Weight: 147 lb (66.7 kg)  CV: RRR Lungs: CTA B; normal respiratory effort Skin : no rashes  SH: lives in Assisted living facility    Assessment & Plan:

## 2018-12-10 NOTE — Telephone Encounter (Signed)
Per [name] contacted [name] to have picc line pulled. Scheduled to be pulled tomorrow.

## 2018-12-10 NOTE — Assessment & Plan Note (Addendum)
Will have picc line removed after last dose tomorrow by home health

## 2018-12-10 NOTE — Assessment & Plan Note (Signed)
Her noninvasive studies today demonstrate a right ABI of 1.08 and a left ABI that is now increased to 1.27.  Before her intervention her ABI was only 0.8.  Also, her left digital pressure is now up over 100 at 109.  This is a marked improvement and should give [place] a pretty good chance of wound healing.  At this point, I would not recommend any intervention or invasive therapy.  I will see keep a relatively short interval follow-up of 3 months at this time given her high risk status with longstanding wounds.

## 2018-12-10 NOTE — Assessment & Plan Note (Signed)
Seems related to the medication.  Hopefully will resolve after stopping

## 2018-12-10 NOTE — Patient Instructions (Signed)
Peripheral Vascular Disease  Peripheral vascular disease (PVD) is a disease of the blood vessels that are not part of your heart and brain. A simple term for PVD is poor circulation. In most cases, PVD narrows the blood vessels that carry blood from your heart to the rest of your body. This can reduce the supply of blood to your arms, legs, and internal organs, like your stomach or kidneys. However, PVD most often affects a person's lower legs and feet. Without treatment, PVD tends to get worse. PVD can also lead to acute ischemic limb. This is when an arm or leg suddenly cannot get enough blood. This is a medical emergency. Follow these instructions at home: Lifestyle  Do not use any products that contain nicotine or tobacco, such as cigarettes and e-cigarettes. If you need help quitting, ask your doctor.  Lose weight if you are overweight. Or, stay at a healthy weight as told by your doctor.  Eat a diet that is low in fat and cholesterol. If you need help, ask your doctor.  Exercise regularly. Ask your doctor for activities that are right for you. General instructions  Take over-the-counter and prescription medicines only as told by your doctor.  Take good care of your feet: ? Wear comfortable shoes that fit well. ? Check your feet often for any cuts or sores.  Keep all follow-up visits as told by your doctor This is important. Contact a doctor if:  You have cramps in your legs when you walk.  You have leg pain when you are at rest.  You have coldness in a leg or foot.  Your skin changes.  You are unable to get or have an erection (erectile dysfunction).  You have cuts or sores on your feet that do not heal. Get help right away if:  Your arm or leg turns cold, numb, and blue.  Your arms or legs become red, warm, swollen, painful, or numb.  You have chest pain.  You have trouble breathing.  You suddenly have weakness in your face, arm, or leg.  You become very  confused or you cannot speak.  You suddenly have a very bad headache.  You suddenly cannot see. Summary  Peripheral vascular disease (PVD) is a disease of the blood vessels.  A simple term for PVD is poor circulation. Without treatment, PVD tends to get worse.  Treatment may include exercise, low fat and low cholesterol diet, and quitting smoking. This information is not intended to replace advice given to you by your health care provider. Make sure you discuss any questions you have with your health care provider. Document Released: 02/14/2010 Document Revised: 12/28/2016 Document Reviewed: 12/28/2016 Elsevier Interactive Patient Education  2019 [employer]  

## 2018-12-10 NOTE — Assessment & Plan Note (Signed)
Now s/p 6 weeks of IV treatment and will stop tomorrow after last dose.   She will continue to follow up with wound care Follow up here PRN

## 2018-12-13 ENCOUNTER — Other Ambulatory Visit
Admission: RE | Admit: 2018-12-13 | Discharge: 2018-12-13 | Disposition: A | Discharge status: Home / Self Care | Payer: Medicare Other | Source: Ambulatory Visit | Attending: Physician Assistant | Admitting: Physician Assistant

## 2018-12-13 ENCOUNTER — Ambulatory Visit
Admission: RE | Admit: 2018-12-13 | Discharge: 2018-12-13 | Disposition: A | Discharge status: Home / Self Care | Payer: Medicare Other | Source: Ambulatory Visit | Attending: Physician Assistant | Admitting: Physician Assistant

## 2018-12-13 ENCOUNTER — Encounter: Payer: Medicare Other | Attending: Physician Assistant | Admitting: Physician Assistant

## 2018-12-13 ENCOUNTER — Other Ambulatory Visit: Payer: Self-pay | Admitting: Physician Assistant

## 2018-12-13 ENCOUNTER — Ambulatory Visit
Admission: RE | Admit: 2018-12-13 | Discharge: 2018-12-13 | Disposition: A | Discharge status: Home / Self Care | Payer: Medicare Other | Attending: Physician Assistant | Admitting: Physician Assistant

## 2018-12-13 DIAGNOSIS — L89514 Pressure ulcer of right ankle, stage 4: Secondary | ICD-10-CM | POA: Insufficient documentation

## 2018-12-13 DIAGNOSIS — Z85828 Personal history of other malignant neoplasm of skin: Secondary | ICD-10-CM | POA: Insufficient documentation

## 2018-12-13 DIAGNOSIS — M86372 Chronic multifocal osteomyelitis, left ankle and foot: Secondary | ICD-10-CM | POA: Diagnosis not present

## 2018-12-13 DIAGNOSIS — Z950 Presence of cardiac pacemaker: Secondary | ICD-10-CM | POA: Insufficient documentation

## 2018-12-13 DIAGNOSIS — S91109A Unspecified open wound of unspecified toe(s) without damage to nail, initial encounter: Secondary | ICD-10-CM | POA: Diagnosis not present

## 2018-12-13 DIAGNOSIS — M1990 Unspecified osteoarthritis, unspecified site: Secondary | ICD-10-CM | POA: Diagnosis not present

## 2018-12-13 DIAGNOSIS — I509 Heart failure, unspecified: Secondary | ICD-10-CM | POA: Insufficient documentation

## 2018-12-13 DIAGNOSIS — B354 Tinea corporis: Secondary | ICD-10-CM | POA: Diagnosis not present

## 2018-12-13 DIAGNOSIS — I70233 Atherosclerosis of native arteries of right leg with ulceration of ankle: Secondary | ICD-10-CM | POA: Diagnosis not present

## 2018-12-13 DIAGNOSIS — X58XXXA Exposure to other specified factors, initial encounter: Secondary | ICD-10-CM | POA: Diagnosis not present

## 2018-12-13 DIAGNOSIS — I70243 Atherosclerosis of native arteries of left leg with ulceration of ankle: Secondary | ICD-10-CM | POA: Insufficient documentation

## 2018-12-13 DIAGNOSIS — I110 Hypertensive heart disease with heart failure: Secondary | ICD-10-CM | POA: Insufficient documentation

## 2018-12-13 DIAGNOSIS — Z809 Family history of malignant neoplasm, unspecified: Secondary | ICD-10-CM | POA: Diagnosis not present

## 2018-12-13 DIAGNOSIS — Z8249 Family history of ischemic heart disease and other diseases of the circulatory system: Secondary | ICD-10-CM | POA: Insufficient documentation

## 2018-12-13 DIAGNOSIS — L89524 Pressure ulcer of left ankle, stage 4: Secondary | ICD-10-CM | POA: Insufficient documentation

## 2018-12-13 DIAGNOSIS — S59901A Unspecified injury of right elbow, initial encounter: Secondary | ICD-10-CM | POA: Diagnosis not present

## 2018-12-13 DIAGNOSIS — I890 Lymphedema, not elsewhere classified: Secondary | ICD-10-CM | POA: Diagnosis not present

## 2018-12-13 DIAGNOSIS — G629 Polyneuropathy, unspecified: Secondary | ICD-10-CM | POA: Diagnosis not present

## 2018-12-13 DIAGNOSIS — Z9049 Acquired absence of other specified parts of digestive tract: Secondary | ICD-10-CM | POA: Insufficient documentation

## 2018-12-13 DIAGNOSIS — L89623 Pressure ulcer of left heel, stage 3: Secondary | ICD-10-CM | POA: Insufficient documentation

## 2018-12-13 DIAGNOSIS — L89892 Pressure ulcer of other site, stage 2: Secondary | ICD-10-CM | POA: Diagnosis not present

## 2018-12-16 LAB — AEROBIC CULTURE  (SUPERFICIAL SPECIMEN): CULTURE: NORMAL

## 2018-12-16 LAB — AEROBIC CULTURE W GRAM STAIN (SUPERFICIAL SPECIMEN): Gram Stain: NONE SEEN

## 2018-12-16 NOTE — Progress Notes (Addendum)
VIRGINIE, JOSTEN ([id number]) Visit Report for 12/13/2018 Chief Complaint Document Details Patient Name: [name], [name]. Date of Service: 12/13/2018 11:00 AM Medical Record Number: [id number] Patient Account Number: [id number] Date of Birth/Sex: 1923-05-21 (85 y.o. F) Treating RN: Montey Hora Primary Care Provider: Grayland Ormond Other Clinician: Referring Provider: Grayland Ormond Treating Provider/Extender: Melburn Hake, HOYT Weeks in Treatment: 39 Information Obtained from: Patient Chief Complaint Patient is here for right lateral malleolus, right 2nd distal toe and left lateral malleolus ulcer Electronic Signature(s) Signed: 12/16/2018 7:30:10 AM By: Worthy Keeler PA-C Entered By: Worthy Keeler on 12/13/2018 11:07:02 Omalley, [name] ([id number]) -------------------------------------------------------------------------------- Debridement Details Patient Name: [name] Date of Service: 12/13/2018 11:00 AM Medical Record Number: [id number] Patient Account Number: [id number] Date of Birth/Sex: 11-20-1923 (85 y.o. F) Treating RN: Montey Hora Primary Care Provider: Grayland Ormond Other Clinician: Referring Provider: Grayland Ormond Treating Provider/Extender: Melburn Hake, HOYT Weeks in Treatment: 70 Debridement Performed for Wound #3 Left Calcaneus Assessment: Performed By: Physician STONE III, HOYT E., PA-C Debridement Type: Debridement Level of Consciousness (Pre- Awake and Alert procedure): Pre-procedure Verification/Time Yes - 11:54 Out Taken: Start Time: 11:54 Pain Control: Lidocaine 4% Topical Solution Total Area Debrided (L x W): 1.6 (cm) x 1.7 (cm) = 2.72 (cm) Tissue and other material Viable, Non-Viable, Slough, Subcutaneous, Slough debrided: Level: Skin/Subcutaneous Tissue Debridement Description: Excisional Instrument: Curette Bleeding: Minimum Hemostasis Achieved: Pressure End Time: 11:56 Procedural Pain: 0 Post Procedural Pain:  0 Response to Treatment: Procedure was tolerated well Level of Consciousness Awake and Alert (Post-procedure): Post Debridement Measurements of Total Wound Length: (cm) 1.6 Stage: Category/Stage II Width: (cm) 1.7 Depth: (cm) 0.2 Volume: (cm) 0.427 Character of Wound/Ulcer Post Improved Debridement: Post Procedure Diagnosis Same as Pre-procedure Electronic Signature(s) Signed: 12/13/2018 3:29:00 PM By: Montey Hora Signed: 12/16/2018 7:30:10 AM By: Worthy Keeler PA-C Entered By: Montey Hora on 12/13/2018 11:56:44 Spranger, [name] ([id number]) -------------------------------------------------------------------------------- Debridement Details Patient Name: [name] Date of Service: 12/13/2018 11:00 AM Medical Record Number: [id number] Patient Account Number: [id number] Date of Birth/Sex: 26-May-1923 (85 y.o. F) Treating RN: Montey Hora Primary Care Provider: Grayland Ormond Other Clinician: Referring Provider: Grayland Ormond Treating Provider/Extender: Melburn Hake, HOYT Weeks in Treatment: 70 Debridement Performed for Wound #5 Distal Toe Great Assessment: Performed By: Physician STONE III, HOYT E., PA-C Debridement Type: Debridement Level of Consciousness (Pre- Awake and Alert procedure): Pre-procedure Verification/Time Yes - 12:00 Out Taken: Start Time: 12:00 Pain Control: Lidocaine 4% Topical Solution Total Area Debrided (L x W): 0.9 (cm) x 0.5 (cm) = 0.45 (cm) Tissue and other material Viable, Non-Viable, Slough, Subcutaneous, Fibrin/Exudate, Slough debrided: Level: Skin/Subcutaneous Tissue Debridement Description: Excisional Instrument: Curette Bleeding: Minimum Hemostasis Achieved: Pressure End Time: 12:02 Procedural Pain: 0 Post Procedural Pain: 0 Response to Treatment: Procedure was tolerated well Level of Consciousness Awake and Alert (Post-procedure): Post Debridement Measurements of Total Wound Length: (cm) 0.9 Stage:  Category/Stage II Width: (cm) 0.5 Depth: (cm) 0.2 Volume: (cm) 0.071 Character of Wound/Ulcer Post Improved Debridement: Post Procedure Diagnosis Same as Pre-procedure Electronic Signature(s) Signed: 12/13/2018 3:29:00 PM By: Montey Hora Signed: 12/16/2018 7:30:10 AM By: Worthy Keeler PA-C Entered By: Montey Hora on 12/13/2018 12:04:45 Chism, [name] ([id number]) -------------------------------------------------------------------------------- Debridement Details Patient Name: [name] Date of Service: 12/13/2018 11:00 AM Medical Record Number: [id number] Patient Account Number: [id number] Date of Birth/Sex: 02-13-1923 (85 y.o. F) Treating RN: Montey Hora Primary Care Provider: Grayland Ormond Other Clinician: Referring Provider: Grayland Ormond Treating Provider/Extender: STONE III, HOYT Weeks in  Treatment: 70 Debridement Performed for Wound #4 Distal Toe Second Assessment: Performed By: Physician STONE III, HOYT E., PA-C Debridement Type: Debridement Level of Consciousness (Pre- Awake and Alert procedure): Pre-procedure Verification/Time Yes - 11:56 Out Taken: Start Time: 11:56 Pain Control: Lidocaine 4% Topical Solution Total Area Debrided (L x W): 0.5 (cm) x 0.7 (cm) = 0.35 (cm) Tissue and other material Viable, Non-Viable, Bone, Slough, Subcutaneous, Fibrin/Exudate, Slough debrided: Level: Skin/Subcutaneous Tissue/Muscle/Bone Debridement Description: Excisional Instrument: Curette Specimen: Swab, Number of Specimens Taken: 1 Bleeding: Minimum Hemostasis Achieved: Pressure End Time: 12:00 Procedural Pain: 0 Post Procedural Pain: 0 Response to Treatment: Procedure was tolerated well Level of Consciousness Awake and Alert (Post-procedure): Post Debridement Measurements of Total Wound Length: (cm) 0.5 Stage: Category/Stage II Width: (cm) 0.7 Depth: (cm) 0.5 Volume: (cm) 0.137 Character of Wound/Ulcer  Post Improved Debridement: Post Procedure Diagnosis Same as Pre-procedure Electronic Signature(s) Signed: 12/17/2018 5:17:02 PM By: Worthy Keeler PA-C Signed: 12/17/2018 5:22:36 PM By: Montey Hora Previous Signature: 12/13/2018 3:29:00 PM Version By: Montey Hora Previous Signature: 12/16/2018 7:30:10 AM Version By: Worthy Keeler PA-C Entered By: Worthy Keeler on 12/16/2018 16:31:35 Hark, [name] ([id number]) -------------------------------------------------------------------------------- HPI Details Patient Name: [name] Date of Service: 12/13/2018 11:00 AM Medical Record Number: [id number] Patient Account Number: [id number] Date of Birth/Sex: 27-Nov-1923 (85 y.o. 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 Dr. Amalia Hailey the podiatrist saw her on 07/20/2017 and sent her to the wound center for possible hyperbaric oxygen therapy. past medical history of peripheral vascular disease, varicose veins, status post appendectomy, basal cell carcinoma excision from the left leg, cholecystectomy, pacemaker placement, right lower extremity angiography done by Dr. dew in March 2017 with placement of a stent. there is also note of a successful ablation of the right small saphenous vein done which was reviewed by ultrasound on 10/24/2016. the patient had a right small saphenous vein ablation done on 10/20/2016. The patient has never been a smoker. She has been seen by Dr. Corene Cornea dew the vascular surgeon who most recently saw her on 06/15/2017 for evaluation of ongoing problems with right leg swelling.  She had a lower extremity arterial duplex examination done(02/13/17) which showed patent distal right superficial femoral artery stent and above-the-knee popliteal stent without evidence of restenosis. The ABI was more than 1.3 on the right and more than 1.3 on the left. This was consistent with noncompressible arteries due to medial calcification. The right great toe pressure and PPG waveforms are within normal limits and the left great toe pressure and PPG waveforms are decreased. he recommended she continue to wear her compression stockings and continue with elevation. She is scheduled to have a noninvasive arterial study in the near future 08/16/2017 -- had a lower extremity arterial duplex examination done which showed patent distal right superficial femoral artery stent and above-the-knee popliteal stent without evidence of restenosis. The ABI was more than 1.3 on the right and more than 1.3 on the left. This was consistent with noncompressible arteries due to medial calcification. The right great toe pressure and PPG waveforms are within normal limits and the left great toe pressure and PPG waveforms are decreased. the x-ray of the right ankle  has not yet been done 08/24/2017 -- had a right ankle x-ray -- IMPRESSION:1. No fracture, bone lesion or evidence of osteomyelitis. 2. Lateral soft tissue swelling with a soft tissue ulcer. she has not yet seen the vascular surgeon for review 08/31/17 on evaluation today patient's wound appears to be showing signs of improvement. She still with her appointment with vascular in order to review her results of her vascular study and then determine if any intervention would be recommended at that time. No fevers, chills, nausea, or vomiting noted at this time. She has been tolerating the dressing changes without complication. 09/28/17 on evaluation today patient's wound appears to show signs of good improvement in regard to the granulation tissue which is  surfacing. There is still a layer of slough covering the wound and the posterior portion is still significantly deeper than the anterior nonetheless there has been some good sign of things moving towards the better. She is going to go back to Dr. dew for reevaluation to ensure her blood flow is still appropriate. That will be before her next evaluation with [place] next week. No fevers, chills, nausea, or vomiting noted at this time. Patient does have some discomfort rated to be a 3-4/10 depending on activity specifically cleansing the wound makes it worse. 10/05/2017 -- the patient was seen by Dr. Lucky Cowboy last week and noninvasive studies showed a normal right ABI with brisk triphasic waveforms consistent with no arterial insufficiency including normal digital pressures. The duplex showed a patent distal right SFA stent and the proximal SFA was also normal. He was pleased with her test and thought she should have enough of perfusion for normal wound healing. He would see her back in 6 months time. 12/21/17 on evaluation today patient appears to be doing fairly well in regard to her right lateral ankle wound. Unfortunately the main issue that she is expansion at this point is that she is having some issues with what appears to be some cellulitis in the ESMIRNA, RAVAN. ([id number]) right anterior shin. She has also been noting a little bit of uncomfortable feeling especially last night and her ankle area. I'm afraid that she made the developing a little bit of an infection. With that being said I think it is in the early stages. 12/28/17 on evaluation today patient's ankle appears to be doing excellent. She's making good progress at this point the cellulitis seems to have improved after last week's evaluation. Overall she is having no significant discomfort which is excellent news. She does have an appointment with Dr. dew on March 29, 2018 for reevaluation in regard to the stent he placed. She seems to have  excellent blood flow in the right lower extremity. 01/19/12 on evaluation today patient's wound appears to be doing very well. In fact she does not appear to require debridement at this point, there's no evidence of infection, and overall from the standpoint of the wound she seems to be doing very well. With that being said I believe that it may be time to switch to different dressing away from the [facility] Dressing she tells me she does have a lot going on her friend actually passed away yesterday and she's also having a lot of issues with her husband this obviously is weighing heavy on her as far as your thoughts and concerns today. 01/25/18 on evaluation today patient appears to be doing fairly well in regard to her right lateral malleolus. She has been tolerating the dressing changes without complication. Overall  I feel like this is definitely showing signs of improvement as far as how the overall appearance of the wound is there's also evidence of epithelium start to migrate over the granulation tissue. In general I think that she is progressing nicely as far as the wound is concerned. The only concern she really has is whether or not we can switch to every other week visits in order to avoid having as many appointments as her daughters have a difficult time getting her to her appointments as well as the patient's husband to his he is not doing very well at this point. 02/22/18 on evaluation today patient's right lateral malleolus ulcer appears to be doing great. She has been tolerating the dressing changes without complication. Overall you making excellent progress at this time. Patient is having no significant discomfort. 03/15/18 on evaluation today patient appears to be doing much more poorly in regard to her right lateral ankle ulcer at this point. Unfortunately since have last seen her her husband has passed just a few days ago is obviously weighed heavily on her her daughter also had  surgery well she is with her today as usual. There does not appear to be any evidence of infection she does seem to have significant contusion/deep tissue injury to the right lateral malleolus which was not noted previous when I saw her last. It's hard to tell of exactly when this injury occurred although during the time she was spending the night in the hospital this may have been most likely. 03/22/18 on evaluation today patient appears to actually be doing very well in regard to her ulcer. She did unfortunately have a setback which was noted last week however the good news is we seem to be getting back on track and in fact the wound in the core did still have some necrotic tissue which will be addressed at this point today but in general I'm seeing signs that things are on the up and up. She is glad to hear this obviously she's been somewhat concerned that due to the how her wound digressed more recently. 03/29/18 on evaluation today patient appears to be doing fairly well in regard to her right lower extremity lateral malleolus ulcer. She unfortunately does have a new area of pressure injury over the inferior portion where the wound has opened up a little bit larger secondary to the pressure she seems to be getting. She does tell me sometimes when she sleeps at night that it actually hurts and does seem to be pushing on the area little bit more unfortunately. There does not appear to be any evidence of infection which is good news. She has been tolerating the dressing changes without complication. She also did have some bruising in the left second and third toes due to the fact that she may have bump this or injured it although she has neuropathy so she does not feel she did move recently that may have been where this came from. Nonetheless there does not appear to be any evidence of infection at this time. 04/12/18 on evaluation today patient's wound on the right lateral ankle actually appears to  be doing a little bit better with a lot of necrotic docking tissue centrally loosening up in clearing away. However she does have the beginnings of a deep tissue injury on the left lateral malleolus likely due to the fact we've been trying offload the right as much as we have. I think she may benefit from an assistive soft device  to help with offloading and it looks like they're looking at one of the doughnut conditions that wraps around the lower leg to offload which I think will definitely do a good job. With that being said I think we definitely need to address this issue on the left before it becomes a wound. Patient is not having significant pain. 04/19/18 on evaluation today patient appears to be doing excellent in regard to the progress she's made with her right lateral ankle ulcer. The left ankle region which did show evidence of a deep tissue injury seems to be resolving there's little fluid noted underneath and a blister there's nothing open at this point in time overall I feel like this is progressing nicely which is good news. She does not seem to be having significant discomfort at this point which is also good news. 04/25/18-She is here in follow up evaluation for bilateral lateral malleolar ulcers. The right lateral malleolus ulcer with pale subcutaneous tissue exposure, central area of ulcer with tendon/periosteum exposed. The left lateral malleolus ulcer now with Icenhour, [name] ([id number]) central area of nonviable tissue, otherwise deep tissue injury. She is wearing compression wraps to the left lower extremity, she will place the right lower extremity compression wraps on when she gets home. She will be out of town over the weekend and return next week and follow-up appointment. She completed her doxycycline this morning 05/03/18 on evaluation today patient appears to be doing very well in regard to her right lateral ankle ulcer in general. At least she's showing some signs of  improvement in this regard. Unfortunately she has some additional injury to the left lateral malleolus region which appears to be new likely even over the past several days. Again this determination is based on the overall appearance. With that being said the patient is obviously frustrated about this currently. 05/10/18-She is here in follow-up evaluation for bilateral lateral malleolar ulcers. She states she has purchased offloading shoes/boots and they will arrive tomorrow. She was asked to bring them in the office at next week's appointment so her provider is aware of product being utilized. She continues to sleep on right or left side, she has been encouraged to sleep on her back. The right lateral malleolus ulcer is precariously close to peri-osteum; will order xray. The left lateral malleolus ulcer is improved. Will switch back to santyl; she will follow up next week. 05/17/18 on evaluation today patient actually appears to be doing very well in regard to her malleolus her ulcers compared to last time I saw them. She does not seem to have as much in the way of contusion at this point which is great news. With that being said she does continue to have discomfort and I do believe that she is still continuing to benefit from the offloading/pressure reducing boots that were recommended. I think this is the key to trying to get this to heal up completely. 05/24/18 on evaluation today patient actually appears to be doing worse at this point in time unfortunately compared to her last week's evaluation. She is having really no increased pain which is good news unfortunately she does have more maceration in your theme and noted surrounding the right lateral ankle the left lateral ankle is not really is erythematous I do not see signs of the overt cellulitis on that side. Unfortunately the wounds do not seem to have shown any signs of improvement since the last evaluation. She also has significant swelling  especially on the right  compared to previous some of this may be due to infection however also think that she may be served better while she has these wounds by compression wrapping versus continuing to use the Juxta-Lite for the time being. Especially with the amount of drainage that she is experiencing at this point. No fevers, chills, nausea, or vomiting noted at this time. 05/31/18 on evaluation today patient appears to actually be doing better in regard to her right lateral lower extremity ulcer specifically on the malleolus region. She has been tolerating the antibiotic without complication. With that being said she still continues to have issues but a little bit of redness although nothing like she what she was experiencing previous. She still continues to pressure to her ankle area she did get the problem on offloading boots unfortunately she will not wear them she states there too uncomfortable and she can't get in and out of the bed. Nonetheless at this point her wounds seem to be continually getting worse which is not what we want I'm getting somewhat concerned about her progress and how things are going to proceed if we do not intervene in some way shape or form. I therefore had a very lengthy conversation today about offloading yet again and even made a specific suggestion for switching her to a memory foam mattress and even gave the information for a specific one that they could look at getting if it was something that they were interested in considering. She does not want to be considered for a hospital bed air mattress although honestly insurance would not cover it that she does not have any wounds on her trunk. 06/14/18 on evaluation today both wounds over the bilateral lateral malleolus her ulcers appear to be doing better there's no evidence of pressure injury at this point. She did get the foam mattress for her bed and this does seem to have been extremely beneficial for her in my  pinion. Her daughter states that she is having difficulty getting out of bed because of how soft it is. The patient also relates this to be. Nonetheless I do feel like she's actually doing better. Unfortunately right after and around the time she was getting the mattress she also sustained a fall when she got up to go pick up the phone and ended up injuring her right elbow she has [address] sut[address] place. We are not caring for this currently although home health is going to be taking the sutures out shortly. Nonetheless this may be something that we need to evaluate going forward. It depends on how well it has or has not healed in the end. She also recently saw an orthopedic specialist for an injection in the right shoulder just before her fall unfortunately the fall seems to have worsened her pain. 06/21/18 on evaluation today patient appears to be doing about the same in regard to her lateral malleolus ulcers. Both appear to be just a little bit deeper but again we are clinging away the necrotic and dead tissue which I think is why this is progressing towards a deeper realm as opposed to improving from my measurement standpoint in that regard. Nonetheless she has been tolerating the dressing changes she absolutely hates the memory foam mattress topper that was obtained for her nonetheless I do believe this is still doing excellent as far as taking care of excess pressure in regard to the lateral malleolus regions. She in fact has no pressure injury that I see whereas in weeks past it  was week by week I was constantly seeing new pressure injuries. Overall I think it has been very beneficial for her. 07/03/18; patient arrives in my clinic today. She has deep punched out areas over her bilateral lateral malleoli. The area on the right has some more depth. AARILYN, DYE ([id number]) We spent a lot of time today talking about pressure relief for these areas. This started when her daughter asked for  a prescription for a memory foam mattress. I have never written a prescription for a mattress and I don't think insurances would pay for that on an ordinary bed. In any case he came up that she has foam boots that she refuses to wear. I would suggest going to these before any other offloading issues when she is in bed. They say she is meticulous about offloading this the rest of the day 07/10/18- She is seen in follow-up evaluation for bilateral, lateral malleolus ulcers. There is no improvement in the ulcers. She has purchased and is sleeping on a memory foam mattress/overlay, she has been using the offloading boots nightly over the past week. She has a follow up appointment with vascular medicine at the end of October, in my opinion this follow up should be expedited given her deterioration and suboptimal TBI results. We will order plain film xray of the left ankle as deeper structures are palpable; would consider having MRI, regardless of xray report(s). The ulcers will be treated with iodoflex/iodosorb, she is unable to safely change the dressings daily with santyl. 07/19/18 on evaluation today patient appears to be doing in general visually well in regard to her bilateral lateral malleolus ulcers. She has been tolerating the dressing changes without complication which is good news. With that being said we did have an x-ray performed on 07/12/18 which revealed a slight loosen see in the lateral portion of the distal left fibula which may represent artifact but underline lytic destruction or osteomyelitis could not be excluded. MRI was recommended. With that being said we can see about getting the patient scheduled for an MRI to further evaluate this area. In fact we have that scheduled currently for August 20 19,019. 07/26/18 on evaluation today patient's wound on the right lateral ankle actually appears to be doing fairly well at this point in my pinion. She has made some good progress currently.  With that being said unfortunately in regard to the left lateral ankle ulcer this seems to be a little bit more problematic at this time. In fact as I further evaluated the situation she actually had bone exposed which is the first time that's been the case in the bone appear to be necrotic. Currently I did review patient's note from Dr. Bunnie Domino office with  [facility]. He stated that ABI was 1.26 on the right and 0.95 on the left with good waveforms. Her perfusion is stable not reduced from previous studies and her digital waveforms were pretty good particularly on the right. His conclusion upon review of the note was that there was not much she could do to improve her perfusion and he felt she was adequate for wound healing. His suggestion was that she continued to see [place] and consider a synthetic skin graft if there was no underlying infection. He plans to see her back in six months or as needed. 08/01/18 on evaluation today patient appears to be doing better in regard to her right lateral ankle ulcer. Her left lateral ankle ulcer is about the same she still  has bone involvement in evidence of necrosis. There does not appear to be evidence of infection at this time On the right lateral lower extremity. I have started her on the Augmentin she picked this up and started this yesterday. This is to get her through until she sees infectious disease which is scheduled for 08/12/18. 08/06/18 on evaluation today patient appears to be doing rather well considering my discussion with patient's daughter at the end of last week. The area which was marked where she had erythema seems to be improved and this is good news. With that being said overall the patient seems to be making good improvement when it comes to the overall appearance of the right lateral ankle ulcer although this has been slow she at least is coming around in this regard. Unfortunately in regard to the left lateral ankle  ulcer this is osteomyelitis based on the pathology report as well is bone culture. Nonetheless we are still waiting CT scan. Unfortunately the MRI we originally ordered cannot be performed as the patient is a pacemaker which I had overlooked. Nonetheless we are working on the CT scan approval and scheduling as of now. She did go to the hospital over the weekend and was placed on IV Cefzo for a couple of days. Fortunately this seems to have improved the erythema quite significantly which is good news. There does not appear to be any evidence of worsening infection at this time. She did have some bleeding after the last debridement therefore I did not perform any sharp debridement in regard to left lateral ankle at this point. Patient has been approved for a snap vac for the right lateral ankle. 08/14/18; the patient with wounds over her bilateral lateral malleoli. The area on the right actually looks quite good. Been using a snap back on this area. Healthy granulation and appears to be filling in. Unfortunately the area on the left is really problematic. 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 Also noted to have cellulitis. She saw Dr. Novella Olive of infectious disease today and was put on linezolid. We are able to verify this with her pharmacy. She is completed the Augmentin that she was already on. We've been using Iodoflex to this area 08/23/18 on evaluation today patient's wounds both actually appear to be doing better compared to my prior evaluations. Fortunately she showing signs of good improvement in regard to the overall wound status especially where were using the snap vac on the right. In regard to left lateral malleolus the wound bed actually appears to be much cleaner than previously noted. I do not feel any phone directly probed during evaluation today and though there is tendon noted this does not  appear Veasey, [name] ([id number]) to be necrotic it's actually fairly good as far as the overall appearance of the tendon is concerned. In general the wound bed actually appears to be doing significantly better than it was previous. Patient is currently in the care of [name] and I did review that note today. He actually has her on two weeks of linezolid and then following the patient will be on 1-2 months of Keflex. That is the plan currently. She has been on antibiotics therapy as prescribed by myself initially starting on July 30, 2018 and has been on that continuously up to this point. 08/30/18 on evaluation today patient actually appears to be doing much better in regard to her  right lateral malleolus ulcer. She has been tolerating the dressing changes specifically the snap vac without complication although she did have some issues with the seal currently. Apparently there was some trouble with getting it to maintain over the past week past Sunday. Nonetheless overall the wound appears better in regard to the right lateral malleolus region. In regard to left lateral malleolus this actually show some signs of additional granulation although there still tendon noted in the base of the wound this appears to be healthy not necrotic in any way whatsoever. We are considering potentially using a snap vac for the left lateral malleolus as well the product wrap from [employer], [place], was present in the clinic today we're going to see this patient I did have her come in with me after obtaining consent from the patient and her daughter in order to look at the wound and see if there's any recommendation one way or another as to whether or not they felt the snapback could be beneficial for the left lateral malleolus region. But the conclusion was that it might be but that this is definitely a little bit deeper wound than what traditionally would be utilized for a snap vac. 09/06/18 on evaluation today  patient actually appears to be doing excellent in my pinion in regard to both ankle ulcers. She has been tolerating the dressing changes without complication which is great news. Specifically we have been using the snap vac. In regard to the right ankle I'm not even sure that this is going to be necessary for today and following as the wound has filled in quite nicely. In regard to the left ankle I do believe that we're seeing excellent epithelialization from the edge as well as granulation in the central portion the tendon is still exposed but there's no evidence of necrotic bone and in general I feel like the patient has made excellent progress even compared to last week with just one week of the snap vac. 09/11/18; this is a patient who has wounds on her bilateral lateral malleoli. Initially both of these were deep stage IV wounds in the setting of chronic arterial insufficiency. She has been revascularized. As I understand think she been using snap vacs to both of these wounds however the area on the right became more superficial and currently she is only using it on the left. Using silver collagen on the right and silver collagen under the back on the left I believe 09/19/18 on evaluation today patient actually appears to be doing very well in regard to her lateral malleolus or ulcers bilaterally. She has been tolerating the dressing changes without complication. Fortunately there does not appear to be any evidence of infection at this time. Overall I feel like she is improving in an excellent manner and I'm very pleased with the fact that everything seems to be turning towards the better for her. This has obviously been a long road. 09/27/18 on evaluation today patient actually appears to be doing very well in regard to her bilateral lateral malleolus ulcers. She has been tolerating the dressing changes without complication. Fortunately there does not appear to be any evidence of infection at  this time which is also great news. No fevers, chills, nausea, or vomiting noted at this time. Overall I feel like she is doing excellent with the snap vac on the left malleolus. She had 40 mL of fluid collection over the past week. 10/04/18 on evaluation today patient actually appears to be doing well in  regard to her bilateral lateral malleolus ulcers. She continues to tolerate the dressing changes without complication. One issue that I see is the snap vac on the left lateral malleolus which appears to have sealed off some fluid underlying this area and has not really allowed it to heal to the degree that I would like to see. For that reason I did suggest at this point we may want to pack a small piece of packing strip into this region to allow it to more effectively wick out fluid. 10/11/18 in general the patient today does not feel that she has been doing very well. She's been a little bit lethargic and subsequently is having bodyaches as well according to what she tells me today. With that being said overall she has been concerned with the fact that something may be worsening although to be honest her wounds really have not been appearing poorly. She does have a new ulcer on her left heel unfortunately. This may be pressure related. Nonetheless it seems to me to have potentially started at least as a blister I do not see any evidence of deep tissue injury. In regard to the left ankle the snap vac still seems to be causing the ceiling off of the deeper part of the wound which is in turn trapping fluid. I'm not extremely pleased with the overall appearance as far as progress from last week to this week therefore I'm gonna discontinue the snap vac at this point. 10/18/18 patient unfortunately this point has not been feeling well for the past several days. She was seen by Grayland Ormond her primary care provider who is a Librarian, academic at [facility]. Subsequently she states that she's been  very weak and generally feeling malaise. No fevers, chills, nausea, or vomiting noted at this time. With that being said bloodwork was performed at the PCP office on the 11th of this month which showed a white blood cell count of 10.7. This was repeated today Bovard, [name] ([id number]) and shows a white blood cell count of 12.4. This does show signs of worsening. Coupled with the fact that she is feeling worse and that her left ankle wound is not really showing signs of improvement I feel like this is an indication that the osteomyelitis is likely exacerbating not improving. Overall I think we may also want to check her C-reactive protein and sedimentation rate. Actually did call [facility] office this afternoon while the patient was in the office here with me. Subsequently based on the findings we discussed treatment possibilities and I think that it is appropriate for [place] to go ahead and initiate treatment with doxycycline which I'm going to do. Subsequently he did agree to see about adding a CRP and sedimentation rate to her orders. If that has not already been drawn to where they can run it they will contact the patient she can come back to have that check. They are in agreement with plan as far as the patient and her daughter are concerned. Nonetheless also think we need to get in touch with Dr. Henreitta Leber office to see about getting the patient scheduled with him as soon as possible. 11/08/18 on evaluation today patient presents for follow-up concerning her bilateral foot and ankle ulcers. I did do an extensive review of her chart in epic today. Subsequently she was seen by [name] he did initiate Cefepime IV antibiotic therapy. Subsequently she had some issues with her PICC line this had to be removed because it  was coiled and then replaced. Fortunately that was now settled. Unfortunately she has continued have issues with her left heel as well as the issues that she is experiencing  with her bilateral lateral malleolus regions. I do believe however both areas seem to be doing a little bit better on evaluation today which is good news. No fevers, chills, nausea, or vomiting noted at this time. She actually has an angiogram schedule with Dr. dew on this coming Monday, November 11, 2018. Subsequently the patient states that she is feeling much better especially than what she was roughly 2 weeks ago. She actually had to cancel an appointment because she was feeling so poorly. No fevers, chills, nausea, or vomiting noted at this time. 11/15/18 on evaluation today patient actually is status post having had her angiogram with Dr. dew Monday, four days ago. 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 He had to go and work on several areas of the vasculature fortunately he was able to obtain no more than a 30% residual stenosis throughout post procedure. I reviewed this note today. I think this will definitely help with healing at this time. Fortunately there does not appear to be any signs of infection and I do feel like ratio already has a better appearance to it. 11/22/18 upon evaluation today patient actually appears to be doing very well in regard to her wounds in general. The right lateral malleolus looks excellent the heel looks better in the left lateral malleolus also appears to be doing a little better. With that being said the right second toe actually appears to be open and training we been watching this is been dry and stable but now is open. 12/03/2018 Seen today for follow-up and management of multiple bilateral lower extremity wounds. New pressure injury of the great toe which is closed at this time. Wound of the right distal second toe appears larger today with deep undermining and a pocket of fluid present within the undermining region. Left and right malleolus is wounds are stable today with no signs and symptoms of infection.Denies any needs  or concerns during exam today. 12/13/18 on evaluation today patient appears to be doing somewhat better in regard to her left heel ulcer. She also seems to be completely healed in regard to the right lateral malleolus ulcer. The left malleolus ulcer is smaller what unfortunately the wounds which are new over the first and second toes of the right foot are what are most concerning at this point especially the second. Both areas did require sharp debridement today. Electronic Signature(s) Signed: 12/22/2018 1:38:17 AM By: Worthy Keeler PA-C Entered By: Worthy Keeler on 12/22/2018 01:29:32 [name] ([id number]) -------------------------------------------------------------------------------- Physical Exam Details Patient Name: [name], [name] Date of Service: 12/13/2018 11:00 AM Medical Record Number: [id number] Patient Account Number: [id number] Date of Birth/Sex: November 29, 1923 (85 y.o. F) Treating RN: Montey Hora Primary Care Provider: Grayland Ormond Other Clinician: Referring Provider: Grayland Ormond Treating Provider/Extender: STONE III, HOYT Weeks in Treatment: 16 Constitutional Well-nourished and well-hydrated in no acute distress. Respiratory normal breathing without difficulty. clear to auscultation bilaterally. Cardiovascular regular rate and rhythm with normal S1, S2. Psychiatric this patient is able to make decisions and demonstrates good insight into disease process. Alert and Oriented x 3. pleasant and cooperative. Notes Upon evaluation at this point sharp debridement was performed of the left heel which was tolerated without complication. Also did sharply debride the right second toe which revealed that  there was evidence of bone necrosis and a sample of this was sent for culture and pathology. I am also going to send the patient for an x-ray of this region. Unfortunately I'm afraid she may have osteomyelitis the PICC line was just removed three days ago on  the seventh. Obviously this is very frustrating that she has been on the IV antibiotics and thus seems to have potentially developed osteomyelitis of this toe even so. Electronic Signature(s) Signed: 12/22/2018 1:38:17 AM By: Worthy Keeler PA-C Entered By: Worthy Keeler on 12/22/2018 01:30:10 Aydin, [name] ([id number]) -------------------------------------------------------------------------------- Physician Orders Details Patient Name: [name] Date of Service: 12/13/2018 11:00 AM Medical Record Number: [id number] Patient Account Number: [id number] Date of Birth/Sex: 1923/07/14 (85 y.o. F) Treating RN: Montey Hora Primary Care Provider: Grayland Ormond Other Clinician: Referring Provider: Grayland Ormond Treating Provider/Extender: Melburn Hake, HOYT Weeks in Treatment: 45 Verbal / Phone Orders: No Diagnosis Coding ICD-10 Coding Code Description 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, stage 4 L89.524 Pressure ulcer of left ankle, stage 4 L89.623 Pressure ulcer of left heel, stage 3 L89.892 Pressure ulcer of other site, stage 2 I70.233 Atherosclerosis of native arteries of right leg with ulceration of ankle I70.243 Atherosclerosis of native arteries of left leg with ulceration of ankle I89.0 Lymphedema, not elsewhere classified B35.4 Tinea corporis 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 o Cleanse wound with mild soap and water o May Shower, gently pat wound dry prior to applying new dressing. Wound #3 Left Calcaneus o Clean wound with Normal Saline. o Cleanse wound with mild soap and water o May Shower, gently pat wound dry prior to applying new dressing. Wound #4 Distal Toe Second o Clean wound with Normal Saline. o Cleanse wound with mild soap and water o May Shower, gently pat wound dry prior to applying new dressing. Wound #5 Distal  Toe Great o Clean wound with Normal Saline. o Cleanse wound with mild soap and water o May Shower, gently pat wound dry prior to applying new dressing. Anesthetic (add to Medication List) Wound #2 Left,Lateral Malleolus o Topical Lidocaine 4% cream applied to wound bed prior to debridement (In Clinic Only). Wound #3 Left Calcaneus o Topical Lidocaine 4% cream applied to wound bed prior to debridement (In Clinic Only). Wound #4 Distal Toe Second o Topical Lidocaine 4% cream applied to wound bed prior to debridement (In Clinic Only). CAI, FLOTT ([id number]) Wound #5 Distal Toe Great o Topical Lidocaine 4% cream applied to wound bed prior to debridement (In Clinic Only). Primary Wound Dressing Wound #2 Left,Lateral Malleolus o Other: - SNAP VAC with prisma to tunnel Wound #3 Left Calcaneus o Silver Collagen Wound #4 Distal Toe Second o Silver Collagen Wound #5 Distal Toe Great o Silver Collagen Secondary Dressing Wound #3 Left Calcaneus o Boardered Foam Dressing Wound #4 Distal Toe Second o Conform/Kerlix Wound #5 Distal Toe Great o Conform/Kerlix Dressing Change Frequency Wound #2 Left,Lateral Malleolus o Change Dressing Monday, Wednesday, Friday Wound #3 Left Calcaneus o Change Dressing Monday, Wednesday, Friday Wound #4 Distal Toe Second o Change Dressing Monday, Wednesday, Friday Wound #5 Distal Toe Great o Change Dressing Monday, Wednesday, Friday Follow-up Appointments Wound #2 Left,Lateral Malleolus o Return Appointment in 2 weeks. Wound #3 Left Calcaneus o Return Appointment in 2 weeks. Wound #4 Distal Toe Second o Return Appointment in 2 weeks. Wound #5 Distal Toe Great o Return Appointment in 2  weeks. o Return Appointment in 2 weeks. Edema Control EMMANUELLA, MIRANTE ([id number]) Wound #2 Left,Lateral Malleolus o Patient to wear own compression stockings o Patient to wear own Velcro compression  garment. o Elevate legs to the level of the heart and pump ankles as often as possible Wound #3 Left Calcaneus o Patient to wear own compression stockings o Patient to wear own Velcro compression garment. o Elevate legs to the level of the heart and pump ankles as often as possible Wound #4 Distal Toe Second o Patient to wear own compression stockings o Patient to wear own Velcro compression garment. o Elevate legs to the level of the heart and pump ankles as often as possible Wound #5 Distal Toe Great o Patient to wear own compression stockings o Patient to wear own Velcro compression garment. o Elevate legs to the level of the heart and pump ankles as often as possible Off-Loading Wound #2 Left,Lateral Malleolus o Turn and reposition every 2 hours o Other: - do not put pressure on your ankles Wound #3 Left Calcaneus o Turn and reposition every 2 hours o Other: - do not put pressure on your ankles Wound #4 Distal Toe Second o Turn and reposition every 2 hours o Other: - do not put pressure on your ankles Wound #5 Distal Toe Great o Turn and reposition every 2 hours o Other: - do not put pressure on your ankles Additional Orders / Instructions Wound #2 Left,Lateral Malleolus o Increase protein intake. o Increase protein intake. Wound #3 Left Calcaneus o Increase protein intake. o Increase protein intake. Wound #4 Distal Toe Second o Increase protein intake. o Increase protein intake. Wound #5 Distal Toe Great o Increase protein intake. o Increase protein intake. Home Health Wound #3 Left Calcaneus Ivorie, [place] [name] ([id number]) o [place] [facility]. Please visit patient Monday, Wednesday, and Friday o Home Health Nurse may visit PRN to address patientos wound care needs. o FACE TO FACE ENCOUNTER: MEDICARE and MEDICAID PATIENTS: I certify that this patient is under my care and that I had a  face-to-face encounter that meets the physician face-to-face encounter requirements with this patient on this date. The encounter with the patient was in whole or in part for the following MEDICAL CONDITION: (primary reason for [place]) MEDICAL NECESSITY: I certify, that based on my findings, NURSING services are a medically necessary home health service. HOME BOUND STATUS: I certify that my clinical findings support that this patient is homebound (i.e., Due to illness or injury, pt requires aid of supportive devices such as crutches, cane, wheelchairs, walkers, the use of special transportation or the assistance of another person to leave their place of residence. There is a normal inability to leave the home and doing so requires considerable and taxing effort. Other absences are for medical reasons / religious services and are infrequent or of short duration when for other reasons). o If current dressing causes regression in wound condition, may D/C ordered dressing product/s and apply Normal Saline Moist Dressing daily until next [place] / Other MD appointment. [place] of regression in wound condition at [phone]. o Please direct any NON-WOUND related issues/requests for orders to patient's Primary Care Physician Wound #4 Distal Toe [place] [facility]. Please visit patient Monday, Wednesday, and Friday o Home Health Nurse may visit PRN to address patientos wound care needs. o FACE TO FACE ENCOUNTER: MEDICARE and MEDICAID PATIENTS: I certify that this patient  is under my care and that I had a face-to-face encounter that meets the physician face-to-face encounter requirements with this patient on this date. The encounter with the patient was in whole or in part for the following MEDICAL CONDITION: (primary reason for [place] [place]) MEDICAL NECESSITY: I certify, that based on my findings, NURSING services are a  medically necessary home health service. HOME BOUND STATUS: I certify that my clinical findings support that this patient is homebound (i.e., Due to illness or injury, pt requires aid of supportive devices such as crutches, cane, wheelchairs, walkers, the use of special transportation or the assistance of another person to leave their place of residence. There is a normal inability to leave the home and doing so requires considerable and taxing effort. Other absences are for medical reasons / religious services and are infrequent or of short duration when for other reasons). o If current dressing causes regression in wound condition, may D/C ordered dressing product/s and apply Normal Saline Moist Dressing daily until next [place] / Other MD appointment. [place] of regression in wound condition at [phone]. o Please direct any NON-WOUND related issues/requests for orders to patient's Primary Care Physician Wound #5 Distal Toe [place] Visits - [facility]. Please visit patient Monday, Wednesday, and Friday o Home Health Nurse may visit PRN to address patientos wound care needs. o FACE TO FACE ENCOUNTER: MEDICARE and MEDICAID PATIENTS: I certify that this patient is under my care and that I had a face-to-face encounter that meets the physician face-to-face encounter requirements with this patient on this date. The encounter with the patient was in whole or in part for the following MEDICAL CONDITION: (primary reason for [place]) MEDICAL NECESSITY: I certify, that based on my findings, NURSING services are a medically necessary home health service. HOME BOUND STATUS: I certify that my clinical findings support that this patient is homebound (i.e., Due to illness or injury, pt requires aid of supportive devices such as crutches, cane, wheelchairs, walkers, the use of special transportation or the assistance of another person to  leave their place of residence. There is a normal inability to leave the home and doing so requires considerable and taxing effort. Other absences are for medical reasons / religious services and are infrequent or of short duration when for other reasons). o If current dressing causes regression in wound condition, may D/C ordered dressing product/s and apply Normal Saline Moist Dressing daily until next [place] / Other MD appointment. [place] of regression in wound condition at [phone]. o Please direct any NON-WOUND related issues/requests for orders to patient's Primary Care Physician Laboratory o Bacteria identified in Wound by Culture (MICRO) - right second toe oooo LOINC Code: 0086-7 LATIESHA, HARADA ([id number]) oooo Convenience Name: Wound culture routine o Tissue Pathology biopsy report (PATH) - right second toe oooo LOINC Code: 71245-8 oooo Convenience Name: Tiss Path Bx report Radiology o X-ray, toes Electronic Signature(s) Signed: 12/13/2018 3:29:00 PM By: Montey Hora Signed: 12/16/2018 7:30:10 AM By: Worthy Keeler PA-C Entered By: Montey Hora on 12/13/2018 12:07:16 Qazi, [name] ([id number]) -------------------------------------------------------------------------------- Problem List Details Patient Name: [name] Date of Service: 12/13/2018 11:00 AM Medical Record Number: [id number] Patient Account Number: [id number] Date of Birth/Sex: 1923-04-27 (85 y.o. F) Treating RN: Montey Hora Primary Care Provider: Grayland Ormond Other Clinician: Referring Provider: Grayland Ormond Treating Provider/Extender: Melburn Hake, HOYT Weeks in Treatment: 82 Active Problems ICD-10 Evaluated Encounter Code Description  Active Date Today Diagnosis 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, stage 4 05/10/2018 No Yes L89.524 Pressure ulcer of left ankle, stage 4 04/25/2018 No Yes L89.623 Pressure ulcer of left heel, stage 3  10/11/2018 No Yes L89.892 Pressure ulcer of other site, stage 2 11/23/2018 No Yes I70.233 Atherosclerosis of native arteries of right leg with ulceration of 08/10/2017 No Yes ankle I70.243 Atherosclerosis of native arteries of left leg with ulceration of 07/10/2018 No Yes ankle I89.0 Lymphedema, not elsewhere classified 08/10/2017 No Yes B35.4 Tinea corporis 09/28/2017 No Yes 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, left ankle and foot 08/14/2018 No Yes Inactive Problems Resolved Problems DAEJAH, KLEBBA ([id number]) ICD-10 Code Description Active Date Resolved Date S90.822A Blister (nonthermal), left foot, initial encounter 10/11/2018 10/11/2018 Electronic Signature(s) Signed: 12/16/2018 7:30:10 AM By: Worthy Keeler PA-C Entered By: Worthy Keeler on 12/13/2018 11:06:56 Inlow, [name] ([id number]) -------------------------------------------------------------------------------- Progress Note Details Patient Name: [name] Date of Service: 12/13/2018 11:00 AM Medical Record Number: [id number] Patient Account Number: [id number] Date of Birth/Sex: October 12, 1923 (85 y.o. 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 Dr. Amalia Hailey the podiatrist saw her on 07/20/2017 and sent her to the wound center for possible hyperbaric oxygen therapy. past medical history of peripheral vascular disease, varicose veins, status post appendectomy, basal cell carcinoma  excision from the left leg, cholecystectomy, pacemaker placement, right lower extremity angiography done by Dr. dew in March 2017 with placement of a stent. there is also note of a successful ablation of the right small saphenous vein done which was reviewed by ultrasound on 10/24/2016. the patient had a right small saphenous vein ablation done on 10/20/2016. The patient has never been a smoker. She has been seen by Dr. Corene Cornea dew the vascular surgeon who most recently saw her on 06/15/2017 for evaluation of ongoing problems with right leg swelling. She had a lower extremity arterial duplex examination done(02/13/17) which showed patent distal right superficial femoral artery stent and above-the-knee popliteal stent without evidence of restenosis. The ABI was more than 1.3 on the right and more than 1.3 on the left. This was consistent with noncompressible arteries due to medial calcification. The right great toe pressure and PPG waveforms are within normal limits and the left great toe pressure and PPG waveforms are decreased. he recommended she continue to wear her compression stockings and continue with elevation. She is scheduled to have a noninvasive arterial study in the near future 08/16/2017 -- had a lower extremity arterial duplex examination done which showed patent distal right superficial femoral artery stent and above-the-knee popliteal stent without evidence of restenosis. The ABI was more than 1.3 on the right and more than 1.3 on the left. This was consistent with noncompressible arteries due to medial calcification. The right great toe pressure and PPG waveforms are within normal limits and the left great toe pressure and PPG waveforms are decreased. the x-ray of the right ankle has not yet been done 08/24/2017 -- had a right ankle x-ray -- IMPRESSION:1. No fracture, bone lesion or evidence of osteomyelitis. 2. Lateral soft tissue swelling  with a soft tissue ulcer. she has not yet  seen the vascular surgeon for review 08/31/17 on evaluation today patient's wound appears to be showing signs of improvement. She still with her appointment with vascular in order to review her results of her vascular study and then determine if any intervention would be recommended at that time. No fevers, chills, nausea, or vomiting noted at this time. She has been tolerating the dressing changes without complication. 09/28/17 on evaluation today patient's wound appears to show signs of good improvement in regard to the granulation tissue which is surfacing. There is still a layer of slough covering the wound and the posterior portion is still significantly deeper than the anterior nonetheless there has been some good sign of things moving towards the better. She is going to go back to Dr. dew for reevaluation to ensure her blood flow is still appropriate. That will be before her next evaluation with [place] next week. No fevers, chills, nausea, or vomiting noted at this time. Patient does have some discomfort rated to be a 3-4/10 depending on activity specifically cleansing the wound makes it worse. 10/05/2017 -- the patient was seen by Dr. Lucky Cowboy last week and noninvasive studies showed a normal right ABI with brisk CHANDLER, STOFER. ([id number]) triphasic waveforms consistent with no arterial insufficiency including normal digital pressures. The duplex showed a patent distal right SFA stent and the proximal SFA was also normal. He was pleased with her test and thought she should have enough of perfusion for normal wound healing. He would see her back in 6 months time. 12/21/17 on evaluation today patient appears to be doing fairly well in regard to her right lateral ankle wound. Unfortunately the main issue that she is expansion at this point is that she is having some issues with what appears to be some cellulitis in the right anterior shin. She has also been noting a little bit of uncomfortable  feeling especially last night and her ankle area. I'm afraid that she made the developing a little bit of an infection. With that being said I think it is in the early stages. 12/28/17 on evaluation today patient's ankle appears to be doing excellent. She's making good progress at this point the cellulitis seems to have improved after last week's evaluation. Overall she is having no significant discomfort which is excellent news. She does have an appointment with Dr. dew on March 29, 2018 for reevaluation in regard to the stent he placed. She seems to have excellent blood flow in the right lower extremity. 01/19/12 on evaluation today patient's wound appears to be doing very well. In fact she does not appear to require debridement at this point, there's no evidence of infection, and overall from the standpoint of the wound she seems to be doing very well. With that being said I believe that it may be time to switch to different dressing away from the [facility] Dressing she tells me she does have a lot going on her friend actually passed away yesterday and she's also having a lot of issues with her husband this obviously is weighing heavy on her as far as your thoughts and concerns today. 01/25/18 on evaluation today patient appears to be doing fairly well in regard to her right lateral malleolus. She has been tolerating the dressing changes without complication. Overall I feel like this is definitely showing signs of improvement as far as how the overall appearance of the wound is there's also evidence of epithelium start  to migrate over the granulation tissue. In general I think that she is progressing nicely as far as the wound is concerned. The only concern she really has is whether or not we can switch to every other week visits in order to avoid having as many appointments as her daughters have a difficult time getting her to her appointments as well as the patient's husband to his he is not  doing very well at this point. 02/22/18 on evaluation today patient's right lateral malleolus ulcer appears to be doing great. She has been tolerating the dressing changes without complication. Overall you making excellent progress at this time. Patient is having no significant discomfort. 03/15/18 on evaluation today patient appears to be doing much more poorly in regard to her right lateral ankle ulcer at this point. Unfortunately since have last seen her her husband has passed just a few days ago is obviously weighed heavily on her her daughter also had surgery well she is with her today as usual. There does not appear to be any evidence of infection she does seem to have significant contusion/deep tissue injury to the right lateral malleolus which was not noted previous when I saw her last. It's hard to tell of exactly when this injury occurred although during the time she was spending the night in the hospital this may have been most likely. 03/22/18 on evaluation today patient appears to actually be doing very well in regard to her ulcer. She did unfortunately have a setback which was noted last week however the good news is we seem to be getting back on track and in fact the wound in the core did still have some necrotic tissue which will be addressed at this point today but in general I'm seeing signs that things are on the up and up. She is glad to hear this obviously she's been somewhat concerned that due to the how her wound digressed more recently. 03/29/18 on evaluation today patient appears to be doing fairly well in regard to her right lower extremity lateral malleolus ulcer. She unfortunately does have a new area of pressure injury over the inferior portion where the wound has opened up a little bit larger secondary to the pressure she seems to be getting. She does tell me sometimes when she sleeps at night that it actually hurts and does seem to be pushing on the area little bit more  unfortunately. There does not appear to be any evidence of infection which is good news. She has been tolerating the dressing changes without complication. She also did have some bruising in the left second and third toes due to the fact that she may have bump this or injured it although she has neuropathy so she does not feel she did move recently that may have been where this came from. Nonetheless there does not appear to be any evidence of infection at this time. 04/12/18 on evaluation today patient's wound on the right lateral ankle actually appears to be doing a little bit better with a lot of necrotic docking tissue centrally loosening up in clearing away. However she does have the beginnings of a deep tissue injury on the left lateral malleolus likely due to the fact we've been trying offload the right as much as we have. I think she may benefit from an assistive soft device to help with offloading and it looks like they're looking at one of the doughnut conditions that wraps around the lower leg to offload which I think  will definitely do a good job. With that being said I think we definitely need to address this issue on the left before it becomes a wound. Patient is not having significant pain. BIRDIA, JAYCOX ([id number]) 04/19/18 on evaluation today patient appears to be doing excellent in regard to the progress she's made with her right lateral ankle ulcer. The left ankle region which did show evidence of a deep tissue injury seems to be resolving there's little fluid noted underneath and a blister there's nothing open at this point in time overall I feel like this is progressing nicely which is good news. She does not seem to be having significant discomfort at this point which is also good news. 04/25/18-She is here in follow up evaluation for bilateral lateral malleolar ulcers. The right lateral malleolus ulcer with pale subcutaneous tissue exposure, central area of ulcer with  tendon/periosteum exposed. The left lateral malleolus ulcer now with central area of nonviable tissue, otherwise deep tissue injury. She is wearing compression wraps to the left lower extremity, she will place the right lower extremity compression wraps on when she gets home. She will be out of town over the weekend and return next week and follow-up appointment. She completed her doxycycline this morning 05/03/18 on evaluation today patient appears to be doing very well in regard to her right lateral ankle ulcer in general. At least she's showing some signs of improvement in this regard. Unfortunately she has some additional injury to the left lateral malleolus region which appears to be new likely even over the past several days. Again this determination is based on the overall appearance. With that being said the patient is obviously frustrated about this currently. 05/10/18-She is here in follow-up evaluation for bilateral lateral malleolar ulcers. She states she has purchased offloading shoes/boots and they will arrive tomorrow. She was asked to bring them in the office at next week's appointment so her provider is aware of product being utilized. She continues to sleep on right or left side, she has been encouraged to sleep on her back. The right lateral malleolus ulcer is precariously close to peri-osteum; will order xray. The left lateral malleolus ulcer is improved. Will switch back to santyl; she will follow up next week. 05/17/18 on evaluation today patient actually appears to be doing very well in regard to her malleolus her ulcers compared to last time I saw them. She does not seem to have as much in the way of contusion at this point which is great news. With that being said she does continue to have discomfort and I do believe that she is still continuing to benefit from the offloading/pressure reducing boots that were recommended. I think this is the key to trying to get this to heal up  completely. 05/24/18 on evaluation today patient actually appears to be doing worse at this point in time unfortunately compared to her last week's evaluation. She is having really no increased pain which is good news unfortunately she does have more maceration in your theme and noted surrounding the right lateral ankle the left lateral ankle is not really is erythematous I do not see signs of the overt cellulitis on that side. Unfortunately the wounds do not seem to have shown any signs of improvement since the last evaluation. She also has significant swelling especially on the right compared to previous some of this may be due to infection however also think that she may be served better while she has these wounds by compression  wrapping versus continuing to use the Juxta-Lite for the time being. Especially with the amount of drainage that she is experiencing at this point. No fevers, chills, nausea, or vomiting noted at this time. 05/31/18 on evaluation today patient appears to actually be doing better in regard to her right lateral lower extremity ulcer specifically on the malleolus region. She has been tolerating the antibiotic without complication. With that being said she still continues to have issues but a little bit of redness although nothing like she what she was experiencing previous. She still continues to pressure to her ankle area she did get the problem on offloading boots unfortunately she will not wear them she states there too uncomfortable and she can't get in and out of the bed. Nonetheless at this point her wounds seem to be continually getting worse which is not what we want I'm getting somewhat concerned about her progress and how things are going to proceed if we do not intervene in some way shape or form. I therefore had a very lengthy conversation today about offloading yet again and even made a specific suggestion for switching her to a memory foam mattress and even gave  the information for a specific one that they could look at getting if it was something that they were interested in considering. She does not want to be considered for a hospital bed air mattress although honestly insurance would not cover it that she does not have any wounds on her trunk. 06/14/18 on evaluation today both wounds over the bilateral lateral malleolus her ulcers appear to be doing better there's no evidence of pressure injury at this point. She did get the foam mattress for her bed and this does seem to have been extremely beneficial for her in my pinion. Her daughter states that she is having difficulty getting out of bed because of how soft it is. The patient also relates this to be. Nonetheless I do feel like she's actually doing better. Unfortunately right after and around the time she was getting the mattress she also sustained a fall when she got up to go pick up the phone and ended up injuring her right elbow she has 18 sutures in place. We are not caring for this currently although home health is going to be taking the sutures out shortly. Nonetheless this may be something that we need to evaluate going forward. It depends on how well it has or has not healed in the end. She also recently saw an orthopedic specialist for an injection in the right shoulder just before her fall unfortunately the fall seems to have worsened her pain. 06/21/18 on evaluation today patient appears to be doing about the same in regard to her lateral malleolus ulcers. Both appear to be just a little bit deeper but again we are clinging away the necrotic and dead tissue which I think is why this is progressing towards a deeper realm as opposed to improving from my measurement standpoint in that regard. Nonetheless she has been tolerating the dressing changes she absolutely hates the memory foam mattress topper that was obtained for her nonetheless [name], [name] [name] ([id number]) I do believe this is  still doing excellent as far as taking care of excess pressure in regard to the lateral malleolus regions. She in fact has no pressure injury that I see whereas in weeks past it was week by week I was constantly seeing new pressure injuries. Overall I think it has been very beneficial for her. 07/03/18;  patient arrives in my clinic today. She has deep punched out areas over her bilateral lateral malleoli. The area on the right has some more depth. We spent a lot of time today talking about pressure relief for these areas. This started when her daughter asked for a prescription for a memory foam mattress. I have never written a prescription for a mattress and I don't think insurances would pay for that on an ordinary bed. In any case he came up that she has foam boots that she refuses to wear. I would suggest going to these before any other offloading issues when she is in bed. They say she is meticulous about offloading this the rest of the day 07/10/18- She is seen in follow-up evaluation for bilateral, lateral malleolus ulcers. There is no improvement in the ulcers. She has purchased and is sleeping on a memory foam mattress/overlay, she has been using the offloading boots nightly over the past week. She has a follow up appointment with vascular medicine at the end of October, in my opinion this follow up should be expedited given her deterioration and suboptimal TBI results. We will order plain film xray of the left ankle as deeper structures are palpable; would consider having MRI, regardless of xray report(s). The ulcers will be treated with iodoflex/iodosorb, she is unable to safely change the dressings daily with santyl. 07/19/18 on evaluation today patient appears to be doing in general visually well in regard to her bilateral lateral malleolus ulcers. She has been tolerating the dressing changes without complication which is good news. With that being said we did have an x-ray performed on  07/12/18 which revealed a slight loosen see in the lateral portion of the distal left fibula which may represent artifact but underline lytic destruction or osteomyelitis could not be excluded. MRI was recommended. With that being said we can see about getting the patient scheduled for an MRI to further evaluate this area. In fact we have that scheduled currently for August 20 19,019. 07/26/18 on evaluation today patient's wound on the right lateral ankle actually appears to be doing fairly well at this point in my pinion. She has made some good progress currently. With that being said unfortunately in regard to the left lateral ankle ulcer this seems to be a little bit more problematic at this time. In fact as I further evaluated the situation she actually had bone exposed which is the first time that's been the case in the bone appear to be necrotic. Currently I did review patient's note from Dr. Bunnie Domino office with [facility]. He stated that ABI was 1.26 on the right and 0.95 on the left with good waveforms. Her perfusion is stable not reduced from previous studies and her digital waveforms were pretty good particularly on the right. His conclusion upon review of the note was that there was not much she could do to improve her perfusion and he felt she was adequate for wound healing. His suggestion was that she continued to see [place] and consider a synthetic skin graft if there was no underlying infection. He plans to see her back in six months or as needed. 08/01/18 on evaluation today patient appears to be doing better in regard to her right lateral ankle ulcer. Her left lateral ankle ulcer is about the same she still has bone involvement in evidence of necrosis. There does not appear to be evidence of infection at this time On the right lateral lower extremity. I have  started her on the Augmentin she picked this up and started this yesterday. This is to get her through until she  sees infectious disease which is scheduled for 08/12/18. 08/06/18 on evaluation today patient appears to be doing rather well considering my discussion with patient's daughter at the end of last week. The area which was marked where she had erythema seems to be improved and this is good news. With that being said overall the patient seems to be making good improvement when it comes to the overall appearance of the right lateral ankle ulcer although this has been slow she at least is coming around in this regard. Unfortunately in regard to the left lateral ankle ulcer this is osteomyelitis based on the pathology report as well is bone culture. Nonetheless we are still waiting CT scan. Unfortunately the MRI we originally ordered cannot be performed as the patient is a pacemaker which I had overlooked. Nonetheless we are working on the CT scan approval and scheduling as of now. She did go to the hospital over the weekend and was placed on IV Cefzo for a couple of days. Fortunately this seems to have improved the erythema quite significantly which is good news. There does not appear to be any evidence of worsening infection at this time. She did have some bleeding after the last debridement therefore I did not perform any sharp debridement in regard to left lateral ankle at this point. Patient has been approved for a snap vac for the right lateral ankle. 08/14/18; the patient with wounds over her bilateral lateral malleoli. The area on the right actually looks quite good. Been using a snap back on this area. Healthy granulation and appears to be filling in. Unfortunately the area on the left is really problematic. 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 Also noted to have cellulitis. She saw Dr. Novella Olive of infectious disease today and was put on linezolid. We are able to verify this with her pharmacy. She is completed the  Augmentin that she was already on. We've been using Iodoflex to this [name], [name] ([id number]) area 08/23/18 on evaluation today patient's wounds both actually appear to be doing better compared to my prior evaluations. Fortunately she showing signs of good improvement in regard to the overall wound status especially where were using the snap vac on the right. In regard to left lateral malleolus the wound bed actually appears to be much cleaner than previously noted. I do not feel any phone directly probed during evaluation today and though there is tendon noted this does not appear to be necrotic it's actually fairly good as far as the overall appearance of the tendon is concerned. In general the wound bed actually appears to be doing significantly better than it was previous. Patient is currently in the care of [name] and I did review that note today. He actually has her on two weeks of linezolid and then following the patient will be on 1-2 months of Keflex. That is the plan currently. She has been on antibiotics therapy as prescribed by myself initially starting on July 30, 2018 and has been on that continuously up to this point. 08/30/18 on evaluation today patient actually appears to be doing much better in regard to her right lateral malleolus ulcer. She has been tolerating the dressing changes specifically the snap vac without complication although she did have some issues with the seal currently.  Apparently there was some trouble with getting it to maintain over the past week past Sunday. Nonetheless overall the wound appears better in regard to the right lateral malleolus region. In regard to left lateral malleolus this actually show some signs of additional granulation although there still tendon noted in the base of the wound this appears to be healthy not necrotic in any way whatsoever. We are considering potentially using a snap vac for the left lateral malleolus as well the  product wrap from [employer], [place], was present in the clinic today we're going to see this patient I did have her come in with me after obtaining consent from the patient and her daughter in order to look at the wound and see if there's any recommendation one way or another as to whether or not they felt the snapback could be beneficial for the left lateral malleolus region. But the conclusion was that it might be but that this is definitely a little bit deeper wound than what traditionally would be utilized for a snap vac. 09/06/18 on evaluation today patient actually appears to be doing excellent in my pinion in regard to both ankle ulcers. She has been tolerating the dressing changes without complication which is great news. Specifically we have been using the snap vac. In regard to the right ankle I'm not even sure that this is going to be necessary for today and following as the wound has filled in quite nicely. In regard to the left ankle I do believe that we're seeing excellent epithelialization from the edge as well as granulation in the central portion the tendon is still exposed but there's no evidence of necrotic bone and in general I feel like the patient has made excellent progress even compared to last week with just one week of the snap vac. 09/11/18; this is a patient who has wounds on her bilateral lateral malleoli. Initially both of these were deep stage IV wounds in the setting of chronic arterial insufficiency. She has been revascularized. As I understand think she been using snap vacs to both of these wounds however the area on the right became more superficial and currently she is only using it on the left. Using silver collagen on the right and silver collagen under the back on the left I believe 09/19/18 on evaluation today patient actually appears to be doing very well in regard to her lateral malleolus or ulcers bilaterally. She has been tolerating the dressing changes without  complication. Fortunately there does not appear to be any evidence of infection at this time. Overall I feel like she is improving in an excellent manner and I'm very pleased with the fact that everything seems to be turning towards the better for her. This has obviously been a long road. 09/27/18 on evaluation today patient actually appears to be doing very well in regard to her bilateral lateral malleolus ulcers. She has been tolerating the dressing changes without complication. Fortunately there does not appear to be any evidence of infection at this time which is also great news. No fevers, chills, nausea, or vomiting noted at this time. Overall I feel like she is doing excellent with the snap vac on the left malleolus. She had 40 mL of fluid collection over the past week. 10/04/18 on evaluation today patient actually appears to be doing well in regard to her bilateral lateral malleolus ulcers. She continues to tolerate the dressing changes without complication. One issue that I see is the snap vac on the  left lateral malleolus which appears to have sealed off some fluid underlying this area and has not really allowed it to heal to the degree that I would like to see. For that reason I did suggest at this point we may want to pack a small piece of packing strip into this region to allow it to more effectively wick out fluid. 10/11/18 in general the patient today does not feel that she has been doing very well. She's been a little bit lethargic and subsequently is having bodyaches as well according to what she tells me today. With that being said overall she has been concerned with the fact that something may be worsening although to be honest her wounds really have not been appearing poorly. She does have a new ulcer on her left heel unfortunately. This may be pressure related. Nonetheless it seems to me to have potentially started at least as a blister I do not see any evidence of deep tissue  injury. In regard to the left ankle the snap vac still seems to be causing the ceiling off of the deeper part of the wound which is in turn trapping fluid. I'm not extremely pleased with the overall appearance as far as progress from last week to this week therefore I'm gonna discontinue Bamba, [name] ([id number]) the snap vac at this point. 10/18/18 patient unfortunately this point has not been feeling well for the past several days. She was seen by Grayland Ormond her primary care provider who is a Librarian, academic at [facility]. Subsequently she states that she's been very weak and generally feeling malaise. No fevers, chills, nausea, or vomiting noted at this time. With that being said bloodwork was performed at the PCP office on the 11th of this month which showed a white blood cell count of 10.7. This was repeated today and shows a white blood cell count of 12.4. This does show signs of worsening. Coupled with the fact that she is feeling worse and that her left ankle wound is not really showing signs of improvement I feel like this is an indication that the osteomyelitis is likely exacerbating not improving. Overall I think we may also want to check her C-reactive protein and sedimentation rate. Actually did call [facility] office this afternoon while the patient was in the office here with me. Subsequently based on the findings we discussed treatment possibilities and I think that it is appropriate for [place] to go ahead and initiate treatment with doxycycline which I'm going to do. Subsequently he did agree to see about adding a CRP and sedimentation rate to her orders. If that has not already been drawn to where they can run it they will contact the patient she can come back to have that check. They are in agreement with plan as far as the patient and her daughter are concerned. Nonetheless also think we need to get in touch with Dr. Henreitta Leber office to see about getting the  patient scheduled with him as soon as possible. 11/08/18 on evaluation today patient presents for follow-up concerning her bilateral foot and ankle ulcers. I did do an extensive review of her chart in epic today. Subsequently she was seen by [name] he did initiate Cefepime IV antibiotic therapy. Subsequently she had some issues with her PICC line this had to be removed because it was coiled and then replaced. Fortunately that was now settled. Unfortunately she has continued have issues with her left heel as well as the issues that  she is experiencing with her bilateral lateral malleolus regions. I do believe however both areas seem to be doing a little bit better on evaluation today which is good news. No fevers, chills, nausea, or vomiting noted at this time. She actually has an angiogram schedule with Dr. dew on this coming Monday, November 11, 2018. Subsequently the patient states that she is feeling much better especially than what she was roughly 2 weeks ago. She actually had to cancel an appointment because she was feeling so poorly. No fevers, chills, nausea, or vomiting noted at this time. 11/15/18 on evaluation today patient actually is status post having had her angiogram with Dr. dew Monday, four days ago. 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 He had to go and work on several areas of the vasculature fortunately he was able to obtain no more than a 30% residual stenosis throughout post procedure. I reviewed this note today. I think this will definitely help with healing at this time. Fortunately there does not appear to be any signs of infection and I do feel like ratio already has a better appearance to it. 11/22/18 upon evaluation today patient actually appears to be doing very well in regard to her wounds in general. The right lateral malleolus looks excellent the heel looks better in the left lateral malleolus also appears to be doing a little better.  With that being said the right second toe actually appears to be open and training we been watching this is been dry and stable but now is open. 12/03/2018 Seen today for follow-up and management of multiple bilateral lower extremity wounds. New pressure injury of the great toe which is closed at this time. Wound of the right distal second toe appears larger today with deep undermining and a pocket of fluid present within the undermining region. Left and right malleolus is wounds are stable today with no signs and symptoms of infection.Denies any needs or concerns during exam today. 12/13/18 on evaluation today patient appears to be doing somewhat better in regard to her left heel ulcer. She also seems to be completely healed in regard to the right lateral malleolus ulcer. The left malleolus ulcer is smaller what unfortunately the wounds which are new over the first and second toes of the right foot are what are most concerning at this point especially the second. Both areas did require sharp debridement today. Patient History Information obtained from Patient. Family History Cancer - Father,Siblings, Heart Disease - Siblings, No family history of Diabetes, Hereditary Spherocytosis, Hypertension, Kidney Disease, Lung Disease, Seizures, Stroke, Thyroid Problems, Tuberculosis. Social History Never smoker, Marital Status - Married, Alcohol Use - Never, Drug Use - No History, Caffeine Use - Rarely. NANCYLEE, [name] ([id number]) Review of Systems (ROS) Constitutional Symptoms (General Health) Denies complaints or symptoms of Fever, Chills. Respiratory The patient has no complaints or symptoms. Cardiovascular The patient has no complaints or symptoms. Psychiatric The patient has no complaints or symptoms. Objective Constitutional Well-nourished and well-hydrated in no acute distress. Vitals Time Taken: 11:10 AM, Height: 65 in, Weight: 154.3 lbs, BMI: 25.7, Temperature: 98.3 F, Pulse: 85  bpm, Respiratory Rate: 18 breaths/min, Blood Pressure: 116/52 mmHg. Respiratory normal breathing without difficulty. clear to auscultation bilaterally. Cardiovascular regular rate and rhythm with normal S1, S2. Psychiatric this patient is able to make decisions and demonstrates good insight into disease process. Alert and Oriented x 3. pleasant and cooperative. General Notes: Upon evaluation at this point sharp debridement  was performed of the left heel which was tolerated without complication. Also did sharply debride the right second toe which revealed that there was evidence of bone necrosis and a sample of this was sent for culture and pathology. I am also going to send the patient for an x-ray of this region. Unfortunately I'm afraid she may have osteomyelitis the PICC line was just removed three days ago on the seventh. Obviously this is very frustrating that she has been on the IV antibiotics and thus seems to have potentially developed osteomyelitis of this toe even so. Integumentary (Hair, Skin) Wound #1 status is Healed - Epithelialized. Original cause of wound was Gradually Appeared. The wound is located on the Right,Lateral Malleolus. The wound measures 0cm length x 0cm width x 0cm depth; 0cm^2 area and 0cm^3 volume. There is Fat Layer (Subcutaneous Tissue) Exposed exposed. There is no tunneling or undermining noted. There is a none present amount of drainage noted. The wound margin is distinct with the outline attached to the wound base. There is no granulation within the wound bed. There is no necrotic tissue within the wound bed. The periwound skin appearance exhibited: Rubor. The periwound skin appearance did not exhibit: Callus, Crepitus, Excoriation, Induration, Rash, Scarring, Dry/Scaly, Maceration, Atrophie Blanche, Cyanosis, Ecchymosis, Hemosiderin Staining, Mottled, Pallor, Erythema. Periwound temperature was noted as No Abnormality. The periwound has tenderness on  palpation. Wound #2 status is Open. Original cause of wound was Gradually Appeared. The wound is located on the Left,Lateral Malleolus. The wound measures 0.9cm length x 0.7cm width x 0.8cm depth; 0.495cm^2 area and 0.396cm^3 volume. There is Fat Layer (Subcutaneous Tissue) Exposed exposed. Tunneling has been noted at 5:00 with a maximum distance of 1.9cm. Undermining begins at 7:00 and ends at 1:00 with a maximum distance of 0.3cm. There is a medium amount of serous Barrales, MANISHA CANCEL. ([id number]) drainage noted. The wound margin is epibole. There is large (67-100%) pink, pale granulation within the wound bed. There is no necrotic tissue within the wound bed. The periwound skin appearance exhibited: Maceration, Hemosiderin Staining. The periwound skin appearance did not exhibit: Callus, Crepitus, Excoriation, Induration, Rash, Scarring, Dry/Scaly, Atrophie Blanche, Cyanosis, Ecchymosis, Mottled, Pallor, Rubor, Erythema. Periwound temperature was noted as No Abnormality. The periwound has tenderness on palpation. Wound #3 status is Open. Original cause of wound was Gradually Appeared. The wound is located on the Left Calcaneus. The wound measures 1.6cm length x 1.7cm width x 0.1cm depth; 2.136cm^2 area and 0.214cm^3 volume. There is Fat Layer (Subcutaneous Tissue) Exposed exposed. There is no tunneling or undermining noted. There is a small amount of serous drainage noted. The wound margin is indistinct and nonvisible. There is small (1-33%) hyper - granulation within the wound bed. There is a large (67-100%) amount of necrotic tissue within the wound bed including Eschar and Adherent Slough. The periwound skin appearance exhibited: Maceration. The periwound skin appearance did not exhibit: Callus, Crepitus, Excoriation, Induration, Rash, Scarring, Dry/Scaly, Atrophie Blanche, Cyanosis, Ecchymosis, Hemosiderin Staining, Mottled, Pallor, Rubor, Erythema. Periwound temperature was noted as No  Abnormality. The periwound has tenderness on palpation. Wound #4 status is Open. Original cause of wound was Pressure Injury. The wound is located on the Distal Toe Second. The wound measures 0.5cm length x 0.7cm width x 0.4cm depth; 0.275cm^2 area and 0.11cm^3 volume. There is Fat Layer (Subcutaneous Tissue) Exposed exposed. There is no tunneling or undermining noted. There is a small amount of serous drainage noted. The wound margin is distinct with the outline attached to the  wound base. There is no granulation within the wound bed. There is no necrotic tissue within the wound bed. The periwound skin appearance exhibited: Callus, Dry/Scaly. The periwound skin appearance did not exhibit: Crepitus, Excoriation, Induration, Rash, Scarring, Maceration, Atrophie Blanche, Cyanosis, Ecchymosis, Hemosiderin Staining, Mottled, Pallor, Rubor, Erythema. Periwound temperature was noted as No Abnormality. The periwound has tenderness on palpation. Wound #5 status is Open. Original cause of wound was Footwear Injury. The wound is located on the Distal Toe Great. The wound measures 0.9cm length x 0.5cm width x 0.1cm depth; 0.353cm^2 area and 0.035cm^3 volume. There is Fat Layer (Subcutaneous Tissue) Exposed exposed. There is no tunneling or undermining noted. There is a small amount of serous drainage noted. The wound margin is flat and intact. There is no granulation within the wound bed. There is a medium (34- 66%) amount of necrotic tissue within the wound bed including Eschar and Adherent Slough. The periwound skin appearance exhibited: Callus, Dry/Scaly. The periwound skin appearance did not exhibit: Crepitus, Excoriation, Induration, Rash, Scarring, Maceration, Atrophie Blanche, Cyanosis, Ecchymosis, Hemosiderin Staining, Mottled, Pallor, Rubor, Erythema. Periwound temperature was noted as No Abnormality. 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 There was a Excisional Skin/Subcutaneous Tissue Debridement with a total area of 2.72 sq cm performed by STONE III, HOYT E., PA-C. With the following instrument(s): Curette to remove Viable and Non-Viable tissue/material. Material removed includes Subcutaneous Tissue and Slough and after achieving pain control using Lidocaine 4% Topical Solution. No specimens were taken. A time out was conducted at 11:54, prior to the start of the procedure. A Minimum amount of bleeding was controlled with Pressure. The procedure was tolerated well with a pain level of 0 throughout and a pain level of 0 following the procedure. Post Debridement Measurements: 1.6cm length x 1.7cm width x 0.2cm depth; 0.427cm^3 volume. Post debridement Stage noted as Category/Stage II. Character of Wound/Ulcer Post Debridement is improved. Post procedure Diagnosis Wound #3: Same as Pre-Procedure Wound #4 Pre-procedure diagnosis of Wound #4 is a Pressure Ulcer located on the Distal Toe Second . There was a Excisional Skin/Subcutaneous Tissue/Muscle/Bone Debridement with a total area of 0.35 sq cm performed by STONE III, HOYT E., PA-C. With the following instrument(s): Curette to remove Viable and Non-Viable tissue/material. Material removed includes Bone,Subcutaneous Tissue, Slough, and Fibrin/Exudate after achieving pain control using Lidocaine 4% Topical Solution. 1 specimen was taken by a Swab and sent to the lab per facility protocol. A time out was  conducted at 11:56, prior to the start of the procedure. A Minimum amount of bleeding was controlled with Pressure. The procedure was tolerated well with a pain level of 0 throughout and a pain level of 0 following the procedure. Post Debridement Measurements: 0.5cm length x 0.7cm width x 0.5cm depth; 0.137cm^3 volume. Post debridement Stage noted as Category/Stage II. Character of Wound/Ulcer Post Debridement is improved. Post procedure Diagnosis Wound #4: Same as Pre-Procedure Wound #5 Pre-procedure diagnosis of Wound #5 is a Pressure Ulcer located on the Distal Toe Great . There was  a Excisional Skin/Subcutaneous Tissue Debridement with a total area of 0.45 sq cm performed by STONE III, HOYT E., PA-C. With the following instrument(s): Curette to remove Viable and Non-Viable tissue/material. Material removed includes Subcutaneous Tissue, Slough, and Fibrin/Exudate after achieving pain control using Lidocaine 4% Topical Solution. No specimens were taken. A time out was conducted at 12:00, prior to the start of the procedure. A Minimum amount of bleeding was controlled with Pressure. The procedure was tolerated well with a pain level of 0 throughout and a pain level of 0 following the procedure. Post Debridement Measurements: 0.9cm length x 0.5cm width x 0.2cm depth; 0.071cm^3 volume. Post debridement Stage noted as Category/Stage II. Character of Wound/Ulcer Post Debridement is improved. Post procedure Diagnosis Wound #5: Same as Pre-Procedure Plan Wound Cleansing: Wound #2 Left,Lateral Malleolus: Clean wound with Normal Saline. Cleanse wound with mild soap and water May Shower, gently pat wound dry prior to applying new dressing. Wound #3 Left Calcaneus: Clean wound with Normal Saline. Cleanse wound with mild soap and water May Shower, gently pat wound dry prior to applying new dressing. Wound #4 Distal Toe Second: Clean wound with Normal Saline. Cleanse wound with mild soap and  water May Shower, gently pat wound dry prior to applying new dressing. [name], [name] ([id number]) Wound #5 Distal Toe Great: Clean wound with Normal Saline. Cleanse wound with mild soap and water May Shower, gently pat wound dry prior to applying new dressing. Anesthetic (add to Medication List): Wound #2 Left,Lateral Malleolus: Topical Lidocaine 4% cream applied to wound bed prior to debridement (In Clinic Only). Wound #3 Left Calcaneus: Topical Lidocaine 4% cream applied to wound bed prior to debridement (In Clinic Only). Wound #4 Distal Toe Second: Topical Lidocaine 4% cream applied to wound bed prior to debridement (In Clinic Only). Wound #5 Distal Toe Great: Topical Lidocaine 4% cream applied to wound bed prior to debridement (In Clinic Only). Primary Wound Dressing: Wound #2 Left,Lateral Malleolus: Other: - SNAP VAC with prisma to tunnel Wound #3 Left Calcaneus: Silver Collagen Wound #4 Distal Toe Second: Silver Collagen Wound #5 Distal Toe Great: Silver Collagen Secondary Dressing: Wound #3 Left Calcaneus: Boardered Foam Dressing Wound #4 Distal Toe Second: Conform/Kerlix Wound #5 Distal Toe Great: Conform/Kerlix Dressing Change Frequency: Wound #2 Left,Lateral Malleolus: Change Dressing Monday, Wednesday, Friday Wound #3 Left Calcaneus: Change Dressing Monday, Wednesday, Friday Wound #4 Distal Toe Second: Change Dressing Monday, Wednesday, Friday Wound #5 Distal Toe Great: Change Dressing Monday, Wednesday, Friday Follow-up Appointments: Wound #2 Left,Lateral Malleolus: Return Appointment in 2 weeks. Wound #3 Left Calcaneus: Return Appointment in 2 weeks. Wound #4 Distal Toe Second: Return Appointment in 2 weeks. Wound #5 Distal Toe Great: Return Appointment in 2 weeks. Return Appointment in 2 weeks. Edema Control: Wound #2 Left,Lateral Malleolus: Patient to wear own compression stockings Patient to wear own Velcro compression garment. Elevate legs  to the level of the heart and pump ankles as often as possible Wound #3 Left Calcaneus: Patient to wear own compression stockings Patient to wear own Velcro compression garment. Elevate legs to the level of the heart and pump ankles as often as possible Wound #4 Distal Toe Second: Patient to wear own compression stockings Patient to wear own Velcro compression garment. SHANETA, [name] ([id number]) Elevate legs to the level of the heart and pump ankles as often as possible Wound #5 Distal Toe Great: Patient to wear own compression stockings Patient to wear own Velcro compression garment. Elevate legs to the level of the heart  and pump ankles as often as possible Off-Loading: Wound #2 Left,Lateral Malleolus: Turn and reposition every 2 hours Other: - do not put pressure on your ankles Wound #3 Left Calcaneus: Turn and reposition every 2 hours Other: - do not put pressure on your ankles Wound #4 Distal Toe Second: Turn and reposition every 2 hours Other: - do not put pressure on your ankles Wound #5 Distal Toe Great: Turn and reposition every 2 hours Other: - do not put pressure on your ankles Additional Orders / Instructions: Wound #2 Left,Lateral Malleolus: Increase protein intake. Increase protein intake. Wound #3 Left Calcaneus: Increase protein intake. Increase protein intake. Wound #4 Distal Toe Second: Increase protein intake. Increase protein intake. Wound #5 Distal Toe Great: Increase protein intake. Increase protein intake. Home Health: Wound #3 Left Calcaneus: Continue Home Health Visits - [facility]. Please visit patient Monday, Wednesday, and Friday Home Health Nurse may visit PRN to address patient s wound care needs. FACE TO FACE ENCOUNTER: MEDICARE and MEDICAID PATIENTS: I certify that this patient is under my care and that I had a face-to-face encounter that meets the physician face-to-face encounter requirements with this patient on this date.  The encounter with the patient was in whole or in part for the following MEDICAL CONDITION: (primary reason for [place]) MEDICAL NECESSITY: I certify, that based on my findings, NURSING services are a medically necessary home health service. HOME BOUND STATUS: I certify that my clinical findings support that this patient is homebound (i.e., Due to illness or injury, pt requires aid of supportive devices such as crutches, cane, wheelchairs, walkers, the use of special transportation or the assistance of another person to leave their place of residence. There is a normal inability to leave the home and doing so requires considerable and taxing effort. Other absences are for medical reasons / religious services and are infrequent or of short duration when for other reasons). If current dressing causes regression in wound condition, may D/C ordered dressing product/s and apply Normal Saline Moist Dressing daily until next [place] / Other MD appointment. [place] of regression in wound condition at [phone]. Please direct any NON-WOUND related issues/requests for orders to patient's Primary Care Physician Wound #4 Distal Toe Second: [place] Visits - [facility]. Please visit patient Monday, Wednesday, and Friday Home Health Nurse may visit PRN to address patient s wound care needs. FACE TO FACE ENCOUNTER: MEDICARE and MEDICAID PATIENTS: I certify that this patient is under my care and that I had a face-to-face encounter that meets the physician face-to-face encounter requirements with this patient on this date. The encounter with the patient was in whole or in part for the following MEDICAL CONDITION: (primary reason for [place]) MEDICAL NECESSITY: I certify, that based on my findings, NURSING services are a medically necessary home health service. HOME BOUND STATUS: I certify that my clinical findings support that this patient is homebound  (i.e., Due to illness or injury, pt requires aid of supportive devices such as crutches, cane, wheelchairs, walkers, the use of special transportation or the assistance of another person to leave their place of residence. There is a normal inability to leave the home and doing so requires considerable and taxing effort. Other absences are for medical reasons / religious services and are infrequent or of short duration when for other reasons). ELBA, SCHABER ([id number]) If current dressing causes regression in wound condition, may D/C ordered dressing product/s and apply Normal Saline Moist Dressing daily  until next [place] / Other MD appointment. [place] of regression in wound condition at [phone]. Please direct any NON-WOUND related issues/requests for orders to patient's Primary Care Physician Wound #5 Distal Toe Great: [place] Visits - [facility]. Please visit patient Monday, Wednesday, and Friday Home Health Nurse may visit PRN to address patient s wound care needs. FACE TO FACE ENCOUNTER: MEDICARE and MEDICAID PATIENTS: I certify that this patient is under my care and that I had a face-to-face encounter that meets the physician face-to-face encounter requirements with this patient on this date. The encounter with the patient was in whole or in part for the following MEDICAL CONDITION: (primary reason for [place]) MEDICAL NECESSITY: I certify, that based on my findings, NURSING services are a medically necessary home health service. HOME BOUND STATUS: I certify that my clinical findings support that this patient is homebound (i.e., Due to illness or injury, pt requires aid of supportive devices such as crutches, cane, wheelchairs, walkers, the use of special transportation or the assistance of another person to leave their place of residence. There is a normal inability to leave the home and doing so requires considerable and  taxing effort. Other absences are for medical reasons / religious services and are infrequent or of short duration when for other reasons). If current dressing causes regression in wound condition, may D/C ordered dressing product/s and apply Normal Saline Moist Dressing daily until next [place] / Other MD appointment. [place] of regression in wound condition at [phone]. Please direct any NON-WOUND related issues/requests for orders to patient's Primary Care Physician Laboratory ordered were: Wound culture routine - right second toe, Tiss Path Bx report - right second toe Radiology ordered were: X-ray, toes My suggestion currently is going to be that we go ahead and proceed with the above wound care measures for the next week. The patient is in agreement with plan. Subsequently we will see were things stand at follow-up. Please see above for specific wound care orders. We will see patient for re-evaluation in 1 week(s) here in the clinic. If anything worsens or changes patient will contact our office for additional recommendations. Electronic Signature(s) Signed: 12/22/2018 1:38:17 AM By: Worthy Keeler PA-C Entered By: Worthy Keeler on 12/22/2018 01:30:24 [name] ([id number]) -------------------------------------------------------------------------------- ROS/PFSH Details Patient Name: [name] Date of Service: 12/13/2018 11:00 AM Medical Record Number: [id number] Patient Account Number: [id number] Date of Birth/Sex: 10-Dec-1922 (85 y.o. F) Treating RN: Montey Hora Primary Care Provider: Grayland Ormond Other Clinician: Referring Provider: Grayland Ormond Treating Provider/Extender: Melburn Hake, HOYT Weeks in Treatment: 62 Information Obtained From Patient Wound History Do you currently have one or more open woundso Yes How many open wounds do you currently haveo 1 Approximately how long have you had your woundso 2 yrs How  have you been treating your wound(s) until nowo mupirocin, soaking in epsom salt Has your wound(s) ever healed and then re-openedo No Have you had any lab work done in the past montho No Have you tested positive for an antibiotic resistant organism (MRSA, VRE)o No Have you tested positive for osteomyelitis (bone infection)o No Have you had any tests for circulation on your legso Yes Who ordered the testo Dr. Lucky Cowboy [facility] test [facility] Constitutional Symptoms (General Health) Complaints and Symptoms: Negative for: Fever; Chills Eyes Medical History: Positive for: Cataracts - surgery Respiratory Complaints and Symptoms: No Complaints or Symptoms Cardiovascular Complaints and Symptoms: No  Complaints or Symptoms Medical History: Positive for: Congestive Heart Failure; Hypertension Musculoskeletal Medical History: Positive for: Osteoarthritis Neurologic Medical History: Positive for: Neuropathy [name], [name] ([id number]) Oncologic Medical History: Negative for: Received Chemotherapy; Received Radiation Psychiatric Complaints and Symptoms: No Complaints or Symptoms HBO Extended History Items Eyes: Cataracts Immunizations Pneumococcal Vaccine: Received Pneumococcal Vaccination: Yes Implantable Devices Family and Social History Cancer: Yes - Father,Siblings; Diabetes: No; Heart Disease: Yes - Siblings; Hereditary Spherocytosis: No; Hypertension: No; Kidney Disease: No; Lung Disease: No; Seizures: No; Stroke: No; Thyroid Problems: No; Tuberculosis: No; Never smoker; Marital Status - Married; Alcohol Use: Never; Drug Use: No History; Caffeine Use: Rarely; Financial Concerns: No; Food, Clothing or Shelter Needs: No; Support System Lacking: No; Transportation Concerns: No; Advanced Directives: No; Patient does not want information on Advanced Directives; Do not resuscitate: No; Living Will: Yes (Not Provided); Medical Power of Attorney: No Physician Affirmation I  have reviewed and agree with the above information. Electronic Signature(s) Signed: 12/22/2018 1:38:17 AM By: Worthy Keeler PA-C Signed: 02/10/2019 10:45:26 AM By: Montey Hora Entered By: Worthy Keeler on 12/22/2018 01:29:49 [name] ([id number]) -------------------------------------------------------------------------------- SuperBill Details Patient Name: [name] Date of Service: 12/13/2018 Medical Record Number: [id number] Patient Account Number: [id number] Date of Birth/Sex: [birth year] (85 y.o. 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 I70.243 Atherosclerosis of native arteries of left leg with ulceration of ankle I89.0 Lymphedema, not elsewhere classified B35.4 Tinea corporis 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, left ankle and foot Facility Procedures CPT4 Code: 27062376 Description: 28315 - DEB SUBQ TISSUE 20 SQ CM/< ICD-10 Diagnosis Description L89.623 Pressure ulcer of left heel, stage 3 L89.892 Pressure ulcer of other site, stage 2 Modifier: Quantity: 1 CPT4 Code: 17616073 Description: 71062 - DEB BONE 20 SQ CM/< ICD-10 Diagnosis Description L89.892 Pressure ulcer of other site, stage 2 Modifier: Quantity: 1 Physician Procedures CPT4: Description Modifier Quantity Code [id number] 99214 - WC PHYS LEVEL 4 - EST PT 25 1 ICD-10 Diagnosis Description 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, stage 4 L89.524 Pressure ulcer of left ankle, stage 4 L89.623 Pressure ulcer of left heel, stage 3  L89.892  Pressure ulcer of other site, stage 2 CPT4: 2703500 11042 - WC PHYS SUBQ TISS 20 SQ CM 1 ICD-10 Diagnosis Description L89.623 Pressure ulcer of left heel, stage 3 L89.892 Pressure ulcer of other site, stage 2 MISTIE, ADNEY ([id number]) Electronic Signature(s) Signed: 12/17/2018 5:17:02 PM By: Worthy Keeler PA-C Previous Signature: 12/16/2018 7:30:10 AM Version By: Worthy Keeler PA-C Entered By: Worthy Keeler on 12/16/2018 16:32:59

## 2018-12-16 NOTE — Progress Notes (Signed)
SAYRE, MAZOR ([id number]) Visit Report for 12/13/2018 Arrival Information Details Patient Name: [name], [name] Date of Service: 12/13/2018 11:00 AM Medical Record Number: [id number] Patient Account Number: [id number] Date of Birth/Sex: [birth year] (85 y.o. F) Treating RN: Secundino Ginger Primary Care Kemari Mares: Grayland Ormond Other Clinician: Referring Artemio Dobie: Grayland Ormond Treating Jaella Weinert/Extender: Melburn Hake, HOYT Weeks in Treatment: 10 Visit Information History Since Last Visit Added or deleted any medications: No Patient Arrived: Ambulatory Any new allergies or adverse reactions: No Arrival Time: 11:03 Had a fall or experienced change in No Accompanied By: daughter activities of daily living that may affect Transfer Assistance: None risk of falls: Patient Identification Verified: Yes Signs or symptoms of abuse/neglect since last visito No Secondary Verification Process Yes Hospitalized since last visit: No Completed: Implantable device outside of the clinic excluding No Patient Requires Transmission-Based No cellular tissue based products placed in the center Precautions: since last visit: Patient Has Alerts: Yes Has Dressing in Place as Prescribed: Yes Patient Alerts: ABI AVVS 03/29/18 L Pain Present Now: No 1.05 R 1.03, TBI L .56, R .85 ABI AVVS 07/23/18 L .95 R 1.26 Electronic Signature(s) Signed: 12/13/2018 3:39:37 PM By: Secundino Ginger Entered By: Secundino Ginger on 12/13/2018 11:09:40 Knowlton, [name] ([id number]) -------------------------------------------------------------------------------- Encounter Discharge Information Details Patient Name: [name] Date of Service: 12/13/2018 11:00 AM Medical Record Number: [id number] Patient Account Number: [id number] Date of Birth/Sex: August 11, 1923 (85 y.o. F) Treating RN: Montey Hora Primary Care Jianna Drabik: Grayland Ormond Other Clinician: Referring Jjesus Dingley: Grayland Ormond Treating Ozzie Remmers/Extender: Melburn Hake, HOYT Weeks in Treatment: 78 Encounter Discharge Information Items Post Procedure Vitals Discharge Condition: Stable Temperature (F): 98.5 Ambulatory Status: Walker Pulse (bpm): 85 Discharge Destination: Home Respiratory Rate (breaths/min): 18 Transportation: Private Auto Blood Pressure (mmHg): 116/52 Accompanied By: dtr Schedule Follow-up Appointment: Yes Clinical Summary of Care: Electronic Signature(s) Signed: 12/13/2018 12:58:14 PM By: Montey Hora Entered By: Montey Hora on 12/13/2018 12:58:14 Skowron, [name] ([id number]) -------------------------------------------------------------------------------- Lower Extremity Assessment Details Patient Name: [name] Date of Service: 12/13/2018 11:00 AM Medical Record Number: [id number] Patient Account Number: [id number] Date of Birth/Sex: 1922-12-21 (85 y.o. F) Treating RN: Secundino Ginger Primary Care Seleena Reimers: Grayland Ormond Other Clinician: Referring Josephyne Tarter: Grayland Ormond Treating Latreece Mochizuki/Extender: Melburn Hake, HOYT Weeks in Treatment: 70 Edema Assessment Assessed: [Left: No] [Right: No] Edema: [Left: N] [Right: o] Calf Left: Right: Point of Measurement: 30 cm From Medial Instep cm 27.5 cm Ankle Left: Right: Point of Measurement: 12 cm From Medial Instep cm 19 cm Vascular Assessment Claudication: Claudication Assessment [Right:None] Pulses: Dorsalis Pedis Palpable: [Right:Yes] Posterior Tibial Extremity colors, hair growth, and conditions: Extremity Color: [Right:Hyperpigmented] Hair Growth on Extremity: [Right:No] Temperature of Extremity: [Right:Cool] Capillary Refill: [Right:< 3 seconds] Toe Nail Assessment Left: Right: Thick: Yes Discolored: No Deformed: No Improper Length and Hygiene: No Electronic Signature(s) Signed: 12/13/2018 3:39:37 PM By: Secundino Ginger Entered By: Secundino Ginger on 12/13/2018 11:37:45 Cheong, [name]  ([id number]) -------------------------------------------------------------------------------- Multi Wound Chart Details Patient Name: [name] Date of Service: 12/13/2018 11:00 AM Medical Record Number: [id number] Patient Account Number: [id number] Date of Birth/Sex: 1922-12-25 (85 y.o. F) Treating RN: Montey Hora Primary Care Xochitl Egle: Grayland Ormond Other Clinician: Referring Maxie Slovacek: Grayland Ormond Treating Latrell Potempa/Extender: STONE III, HOYT Weeks in Treatment: 70 Vital Signs Height(in): 65 Pulse(bpm): 85 Weight(lbs): 154.3 Blood Pressure(mmHg): 116/52 Body Mass Index(BMI): 26 Temperature(F): 98.3 Respiratory Rate 18 (breaths/min): Photos: [1:No Photos] [2:No Photos] [3:No Photos] Wound Location: [1:Right, Lateral Malleolus] [2:Left Malleolus - Lateral] [3:Left Calcaneus] Wounding Event: [1:Gradually Appeared] [2:Gradually  Appeared] [3:Gradually Appeared] Primary Etiology: [1:Pressure Ulcer] [2:Pressure Ulcer] [3:Pressure Ulcer] Secondary Etiology: [1:Arterial Insufficiency Ulcer] [2:Arterial Insufficiency Ulcer] [3:N/A] Comorbid History: [1:Cataracts, Congestive Heart Failure, Hypertension, Osteoarthritis, Neuropathy] [2:Cataracts, Congestive Heart Failure, Hypertension, Osteoarthritis, Neuropathy] [3:Cataracts, Congestive Heart Failure, Hypertension, Osteoarthritis,  Neuropathy] Date Acquired: [1:08/11/2015] [2:04/12/2018] [3:10/04/2018] Weeks of Treatment: [1:70] [2:34] [3:9] Wound Status: [1:Healed - Epithelialized] [2:Open] [3:Open] Measurements L x W x D [1:0x0x0] [2:0.9x0.7x1] [3:1.8x2.4x0.1] (cm) Area (cm) : [1:0] [2:0.495] [3:3.393] Volume (cm) : [1:0] [2:0.495] [3:0.339] % Reduction in Area: [1:100.00%] [2:36.90%] [3:-23.40%] % Reduction in Volume: [1:100.00%] [2:-526.60%] [3:38.40%] Position 1 (o'clock): [2:4] Maximum Distance 1 (cm): [2:1] Starting Position 1 [2:7] (o'clock): Ending Position 1 [2:1] (o'clock): Maximum Distance 1 (cm):  [2:0.3] Tunneling: [1:No] [2:Yes] [3:No] Undermining: [1:No] [2:Yes] [3:No] Classification: [1:Category/Stage IV] [2:Category/Stage IV] [3:Category/Stage II] Exudate Amount: [1:None Present] [2:Medium] [3:Small] Exudate Type: [1:N/A] [2:Serous] [3:Serous] Exudate Color: [1:N/A] [2:amber] [3:amber] Wound Margin: [1:Distinct, outline attached] [2:Epibole] [3:Indistinct, nonvisible] Granulation Amount: [1:None Present (0%)] [2:Small (1-33%)] [3:Small (1-33%)] Granulation Quality: [1:N/A] [2:Pink, Pale] [3:Hyper-granulation] Necrotic Amount: [1:None Present (0%)] [2:None Present (0%)] [3:Large (67-100%)] Necrotic Tissue: [1:N/A] [2:N/A] [3:Eschar, Adherent Slough] Exposed Structures: [1:Fat Layer (Subcutaneous Tissue) Exposed: Yes] [2:Fat Layer (Subcutaneous Tissue) Exposed: Yes] [3:Fat Layer (Subcutaneous Tissue) Exposed: Yes] Fascia: No Fascia: No Fascia: No Tendon: No Tendon: No Tendon: No Muscle: No Muscle: No Muscle: No Joint: No Joint: No Joint: No Bone: No Bone: No Bone: No Epithelialization: Medium (34-66%) None None Periwound Skin Texture: Excoriation: No Excoriation: No Excoriation: No Induration: No Induration: No Induration: No Callus: No Callus: No Callus: No Crepitus: No Crepitus: No Crepitus: No Rash: No Rash: No Rash: No Scarring: No Scarring: No Scarring: No Periwound Skin Moisture: Maceration: No Maceration: Yes Maceration: Yes Dry/Scaly: No Dry/Scaly: No Dry/Scaly: No Periwound Skin Color: Rubor: Yes Hemosiderin Staining: Yes Atrophie Blanche: No Atrophie Blanche: No Atrophie Blanche: No Cyanosis: No Cyanosis: No Cyanosis: No Ecchymosis: No Ecchymosis: No Ecchymosis: No Erythema: No Erythema: No Erythema: No Hemosiderin Staining: No Hemosiderin Staining: No Mottled: No Mottled: No Mottled: No Pallor: No Pallor: No Pallor: No Rubor: No Rubor: No Temperature: No Abnormality No Abnormality No Abnormality Tenderness on  Palpation: Yes Yes Yes Wound Preparation: Ulcer Cleansing: Ulcer Cleansing: Ulcer Cleansing: Rinsed/Irrigated with Saline Rinsed/Irrigated with Saline Rinsed/Irrigated with Saline Topical Anesthetic Applied: Topical Anesthetic Applied: Topical Anesthetic Applied: Other: lidocaine 4% Other: lidocaine 4% Other: lidocaine 4% Wound Number: 4 5 N/A Photos: No Photos No Photos N/A Wound Location: Toe Second - Distal Toe Great - Distal N/A Wounding Event: Pressure Injury Footwear Injury N/A Primary Etiology: Pressure Ulcer Pressure Ulcer N/A Secondary Etiology: N/A N/A N/A Comorbid History: Cataracts, Congestive Heart Cataracts, Congestive Heart N/A Failure, Hypertension, Failure, Hypertension, Osteoarthritis, Neuropathy Osteoarthritis, Neuropathy Date Acquired: 10/23/2018 12/03/2018 N/A Weeks of Treatment: 3 1 N/A Wound Status: Open Open N/A Measurements L x W x D 0.5x0.7x0.4 0.9x0.5x0.1 N/A (cm) Area (cm) : 0.275 0.353 N/A Volume (cm) : 0.11 0.035 N/A % Reduction in Area: -287.30% 0.00% N/A % Reduction in Volume: -1471.40% 0.00% N/A Tunneling: No No N/A Undermining: No No N/A Classification: Category/Stage II Category/Stage II N/A Exudate Amount: Small Small N/A Exudate Type: Serous Serous N/A Exudate Color: amber amber N/A Wound Margin: Distinct, outline attached Flat and Intact N/A Granulation Amount: None Present (0%) None Present (0%) N/A Granulation Quality: N/A N/A N/A Furia, [name] ([id number]) Necrotic Amount: None Present (0%) Medium (34-66%) N/A Necrotic Tissue: N/A Eschar, Adherent Slough N/A Exposed Structures: Fat Layer (Subcutaneous Fat Layer (Subcutaneous N/A Tissue)  Exposed: Yes Tissue) Exposed: Yes Fascia: No Fascia: No Tendon: No Tendon: No Muscle: No Muscle: No Joint: No Joint: No Bone: No Bone: No Epithelialization: None None N/A Periwound Skin Texture: Callus: Yes Callus: Yes N/A Excoriation: No Excoriation: No Induration:  No Induration: No Crepitus: No Crepitus: No Rash: No Rash: No Scarring: No Scarring: No Periwound Skin Moisture: Dry/Scaly: Yes Dry/Scaly: Yes N/A Maceration: No Maceration: No Periwound Skin Color: Atrophie Blanche: No Atrophie Blanche: No N/A Cyanosis: No Cyanosis: No Ecchymosis: No Ecchymosis: No Erythema: No Erythema: No Hemosiderin Staining: No Hemosiderin Staining: No Mottled: No Mottled: No Pallor: No Pallor: No Rubor: No Rubor: No Temperature: No Abnormality No Abnormality N/A Tenderness on Palpation: Yes No N/A Wound Preparation: Ulcer Cleansing: Ulcer Cleansing: N/A Rinsed/Irrigated with Saline Rinsed/Irrigated with Saline Topical Anesthetic Applied: Topical Anesthetic Applied: Other: lidocaince 4% Other: lidocaine 4% Treatment Notes Electronic Signature(s) Signed: 12/13/2018 3:29:00 PM By: Montey Hora Entered By: Montey Hora on 12/13/2018 11:46:17 Enright, [name] ([id number]) -------------------------------------------------------------------------------- [place] Details Patient Name: [name] Date of Service: 12/13/2018 11:00 AM Medical Record Number: [id number] Patient Account Number: [id number] Date of Birth/Sex: 1923-07-21 (85 y.o. F) Treating RN: Montey Hora Primary Care Haydyn Girvan: Grayland Ormond Other Clinician: Referring Denyla Cortese: Grayland Ormond Treating Vernesha Talbot/Extender: Melburn Hake, HOYT Weeks in Treatment: 10 Active Inactive Abuse / Safety / Falls / Self Care Management Nursing Diagnoses: Potential for falls Goals: Patient will not experience any injury related to falls Date Initiated: 08/10/2017 Target Resolution Date: 11/10/2017 Goal Status: Active Interventions: Assess Activities of Daily Living upon admission and as needed Assess fall risk on admission and as needed Assess: immobility, friction, shearing, incontinence upon admission and as needed Notes: Nutrition Nursing  Diagnoses: Imbalanced nutrition Potential for alteratiion in Nutrition/Potential for imbalanced nutrition Goals: Patient/caregiver agrees to and verbalizes understanding of need to use nutritional supplements and/or vitamins as prescribed Date Initiated: 08/10/2017 Target Resolution Date: 12/08/2017 Goal Status: Active Interventions: Assess patient nutrition upon admission and as needed per policy Notes: Orientation to the Wound Care Program Nursing Diagnoses: Knowledge deficit related to the wound healing center program Goals: Patient/caregiver will verbalize understanding of the [place] Program Date Initiated: 08/10/2017 Target Resolution Date: 09/08/2017 Goal Status: Active ELIDA, HARBIN ([id number]) Interventions: Provide education on orientation to the wound center Notes: Pain, Acute or Chronic Nursing Diagnoses: Pain, acute or chronic: actual or potential Potential alteration in comfort, pain Goals: Patient/caregiver will verbalize adequate pain control between visits Date Initiated: 08/10/2017 Target Resolution Date: 12/08/2017 Goal Status: Active Interventions: Complete pain assessment as per visit requirements Notes: Wound/Skin Impairment Nursing Diagnoses: Impaired tissue integrity Knowledge deficit related to ulceration/compromised skin integrity Goals: Ulcer/skin breakdown will have a volume reduction of 80% by week 12 Date Initiated: 08/10/2017 Target Resolution Date: 12/01/2017 Goal Status: Active Interventions: Assess patient/caregiver ability to perform ulcer/skin care regimen upon admission and as needed Notes: Electronic Signature(s) Signed: 12/13/2018 3:29:00 PM By: Montey Hora Entered By: Montey Hora on 12/13/2018 11:44:56 Nathanson, [name] ([id number]) -------------------------------------------------------------------------------- Pain Assessment Details Patient Name: [name] Date of Service: 12/13/2018 11:00  AM Medical Record Number: [id number] Patient Account Number: [id number] Date of Birth/Sex: [birth year] (85 y.o. F) Treating RN: Secundino Ginger Primary Care Jalal Rauch: Grayland Ormond Other Clinician: Referring Avonelle Viveros: Grayland Ormond Treating Carri Spillers/Extender: Melburn Hake, HOYT Weeks in Treatment: 70 Active Problems Location of Pain Severity and Description of Pain Patient Has Paino No Site Locations Pain Management and Medication Current Pain Management: Notes pt denies any pain at this time. Electronic  Signature(s) Signed: 12/13/2018 3:39:37 PM By: Secundino Ginger Entered By: Secundino Ginger on 12/13/2018 11:10:02 Bucher, [name] ([id number]) -------------------------------------------------------------------------------- Patient/Caregiver Education Details Patient Name: [name] Date of Service: 12/13/2018 11:00 AM Medical Record Number: [id number] Patient Account Number: [id number] Date of Birth/Gender: [birth year] (85 y.o. F) Treating RN: Montey Hora Primary Care Physician: Grayland Ormond Other Clinician: Referring Physician: Grayland Ormond Treating Physician/Extender: Sharalyn Ink in Treatment: 31 Education Assessment Education Provided To: Patient and Caregiver Education Topics Provided Wound/Skin Impairment: Handouts: Other: wound care as ordered Methods: Demonstration, Explain/Verbal Responses: State content correctly Electronic Signature(s) Signed: 12/13/2018 3:29:00 PM By: Montey Hora Entered By: Montey Hora on 12/13/2018 12:58:39 Sadlon, [name] ([id number]) -------------------------------------------------------------------------------- Wound Assessment Details Patient Name: [name] Date of Service: 12/13/2018 11:00 AM Medical Record Number: [id number] Patient Account Number: [id number] Date of Birth/Sex: Dec 06, 1922 (85 y.o. F) Treating RN: Montey Hora Primary Care Toriana Sponsel: Grayland Ormond Other Clinician: Referring Kairo Laubacher:  Grayland Ormond Treating Stephany Poorman/Extender: STONE III, HOYT Weeks in Treatment: 70 Wound Status Wound Number: 1 Primary Pressure Ulcer Etiology: Wound Location: Right, Lateral Malleolus Secondary Arterial Insufficiency Ulcer Wounding Event: Gradually Appeared Etiology: Date Acquired: 08/11/2015 Wound Status: Healed - Epithelialized Weeks Of Treatment: 70 Comorbid Cataracts, Congestive Heart Failure, Clustered Wound: No History: Hypertension, Osteoarthritis, Neuropathy Photos Photo Uploaded By: Secundino Ginger on 12/13/2018 12:45:53 Wound Measurements Length: (cm) 0 % Re Width: (cm) 0 % Re Depth: (cm) 0 Epit Area: (cm) 0 Tun Volume: (cm) 0 Und duction in Area: 100% duction in Volume: 100% helialization: Medium (34-66%) neling: No ermining: No Wound Description Classification: Category/Stage IV Wound Margin: Distinct, outline attached Exudate Amount: None Present Foul Odor After Cleansing: No Slough/Fibrino No Wound Bed Granulation Amount: None Present (0%) Exposed Structure Necrotic Amount: None Present (0%) Fascia Exposed: No Fat Layer (Subcutaneous Tissue) Exposed: Yes Tendon Exposed: No Muscle Exposed: No Joint Exposed: No Bone Exposed: No Periwound Skin Texture Texture Color No Abnormalities Noted: No No Abnormalities Noted: No Betsch, [name] ([id number]) Callus: No Atrophie Blanche: No Crepitus: No Cyanosis: No Excoriation: No Ecchymosis: No Induration: No Erythema: No Rash: No Hemosiderin Staining: No Scarring: No Mottled: No Pallor: No Moisture Rubor: Yes No Abnormalities Noted: No Dry / Scaly: No Temperature / Pain Maceration: No Temperature: No Abnormality Tenderness on Palpation: Yes Wound Preparation Ulcer Cleansing: Rinsed/Irrigated with Saline Topical Anesthetic Applied: Other: lidocaine 4%, Electronic Signature(s) Signed: 12/13/2018 3:29:00 PM By: Montey Hora Entered By: Montey Hora on 12/13/2018 11:44:36 Haviland, [name] ([id number]) -------------------------------------------------------------------------------- Wound Assessment Details Patient Name: [name] Date of Service: 12/13/2018 11:00 AM Medical Record Number: [id number] Patient Account Number: [id number] Date of Birth/Sex: 04-03-1923 (85 y.o. F) Treating RN: Montey Hora Primary Care Dewon Mendizabal: Grayland Ormond Other Clinician: Referring Enez Monahan: Grayland Ormond Treating Alisandra Son/Extender: STONE III, HOYT Weeks in Treatment: 70 Wound Status Wound Number: 2 Primary Pressure Ulcer Etiology: Wound Location: Left Malleolus - Lateral Secondary Arterial Insufficiency Ulcer Wounding Event: Gradually Appeared Etiology: Date Acquired: 04/12/2018 Wound Status: Open Weeks Of Treatment: 34 Comorbid Cataracts, Congestive Heart Failure, Clustered Wound: No History: Hypertension, Osteoarthritis, Neuropathy Photos Wound Measurements Length: (cm) 0.9 % Reduction i Width: (cm) 0.7 % Reduction i Depth: (cm) 0.8 Epithelializa Area: (cm) 0.495 Tunneling: Volume: (cm) 0.396 Position Maximum Di n Area: 36.9% n Volume: -401.3% tion: None Yes (o'clock): 5 stance: (cm) 1.9 Undermining: Yes Starting Position (o'clock): 7 Ending Position (o'clock): 1 Maximum Distance: (cm) 0.3 Wound Description Classification: Category/Stage IV Foul Odor Aft Wound Margin: Epibole Slough/Fibrin Exudate Amount: Medium Exudate  Type: Serous Exudate Color: amber er Cleansing: No o Yes Wound Bed Granulation Amount: Large (67-100%) Exposed Structure Granulation Quality: Pink, Pale Fascia Exposed: No Carillo, [name] ([id number]) Necrotic Amount: None Present (0%) Fat Layer (Subcutaneous Tissue) Exposed: Yes Tendon Exposed: No Muscle Exposed: No Joint Exposed: No Bone Exposed: No Periwound Skin Texture Texture Color No Abnormalities Noted: No No Abnormalities Noted: No Callus: No Atrophie Blanche: No Crepitus: No Cyanosis: No Excoriation:  No Ecchymosis: No Induration: No Erythema: No Rash: No Hemosiderin Staining: Yes Scarring: No Mottled: No Pallor: No Moisture Rubor: No No Abnormalities Noted: No Dry / Scaly: No Temperature / Pain Maceration: Yes Temperature: No Abnormality Tenderness on Palpation: Yes Wound Preparation Ulcer Cleansing: Rinsed/Irrigated with Saline Topical Anesthetic Applied: Other: lidocaine 4%, Treatment Notes Wound #2 (Left, Lateral Malleolus) Notes prisma to all wounds; left malleolus - SNAP VAC; left calcaneous - bordered foam dressing; toes - gauze and conform Electronic Signature(s) Signed: 12/13/2018 3:29:00 PM By: Montey Hora Signed: 12/13/2018 3:39:37 PM By: Secundino Ginger Entered By: Secundino Ginger on 12/13/2018 12:36:54 Khurana, [name] ([id number]) -------------------------------------------------------------------------------- Wound Assessment Details Patient Name: [name] Date of Service: 12/13/2018 11:00 AM Medical Record Number: [id number] Patient Account Number: [id number] Date of Birth/Sex: [birth year] (85 y.o. F) Treating RN: Montey Hora Primary Care Daschel Roughton: Grayland Ormond Other Clinician: Referring Kyara Boxer: Grayland Ormond Treating Jhoana Upham/Extender: STONE III, HOYT Weeks in Treatment: 52 Wound Status Wound Number: 3 Primary Pressure Ulcer Etiology: Wound Location: Left Calcaneus Wound Open Wounding Event: Gradually Appeared Status: Date Acquired: 10/04/2018 Comorbid Cataracts, Congestive Heart Failure, Weeks Of Treatment: 9 History: Hypertension, Osteoarthritis, Neuropathy Clustered Wound: No Photos Photo Uploaded By: Secundino Ginger on 12/13/2018 12:48:50 Wound Measurements Length: (cm) 1.6 Width: (cm) 1.7 Depth: (cm) 0.1 Area: (cm) 2.136 Volume: (cm) 0.214 % Reduction in Area: 22.3% % Reduction in Volume: 61.1% Epithelialization: None Tunneling: No Undermining: No Wound Description Classification: Category/Stage II Foul Odor A Wound  Margin: Indistinct, nonvisible Slough/Fibr Exudate Amount: Small Exudate Type: Serous Exudate Color: amber fter Cleansing: No ino Yes Wound Bed Granulation Amount: Small (1-33%) Exposed Structure Granulation Quality: Hyper-granulation Fascia Exposed: No Necrotic Amount: Large (67-100%) Fat Layer (Subcutaneous Tissue) Exposed: Yes Necrotic Quality: Eschar, Adherent Slough Tendon Exposed: No Muscle Exposed: No Joint Exposed: No Bone Exposed: No Periwound Skin Texture Quickel, [name] ([id number]) Texture Color No Abnormalities Noted: No No Abnormalities Noted: No Callus: No Atrophie Blanche: No Crepitus: No Cyanosis: No Excoriation: No Ecchymosis: No Induration: No Erythema: No Rash: No Hemosiderin Staining: No Scarring: No Mottled: No Pallor: No Moisture Rubor: No No Abnormalities Noted: No Dry / Scaly: No Temperature / Pain Maceration: Yes Temperature: No Abnormality Tenderness on Palpation: Yes Wound Preparation Ulcer Cleansing: Rinsed/Irrigated with Saline Topical Anesthetic Applied: Other: lidocaine 4%, Treatment Notes Wound #3 (Left Calcaneus) Notes prisma to all wounds; left malleolus - SNAP VAC; left calcaneous - bordered foam dressing; toes - gauze and conform Electronic Signature(s) Signed: 12/13/2018 3:29:00 PM By: Montey Hora Signed: 12/13/2018 3:39:37 PM By: Secundino Ginger Entered By: Secundino Ginger on 12/13/2018 12:38:24 Vicuna, [name] ([id number]) -------------------------------------------------------------------------------- Wound Assessment Details Patient Name: [name] Date of Service: 12/13/2018 11:00 AM Medical Record Number: [id number] Patient Account Number: [id number] Date of Birth/Sex: [birth year] (85 y.o. F) Treating RN: Secundino Ginger Primary Care Shann Lewellyn: Grayland Ormond Other Clinician: Referring Analia Zuk: Grayland Ormond Treating Jeremyah Jelley/Extender: STONE III, HOYT Weeks in Treatment: 70 Wound Status Wound Number: 4  Primary Pressure Ulcer Etiology: Wound Location: Toe Second - Distal Wound Open Wounding Event: Pressure Injury  Status: Date Acquired: 10/23/2018 Comorbid Cataracts, Congestive Heart Failure, Weeks Of Treatment: 3 History: Hypertension, Osteoarthritis, Neuropathy Clustered Wound: No Photos Photo Uploaded By: Secundino Ginger on 12/13/2018 12:47:50 Wound Measurements Length: (cm) 0.5 Width: (cm) 0.7 Depth: (cm) 0.4 Area: (cm) 0.275 Volume: (cm) 0.11 % Reduction in Area: -287.3% % Reduction in Volume: -1471.4% Epithelialization: None Tunneling: No Undermining: No Wound Description Classification: Category/Stage II Foul Odor A Wound Margin: Distinct, outline attached Slough/Fibr Exudate Amount: Small Exudate Type: Serous Exudate Color: amber fter Cleansing: No ino Yes Wound Bed Granulation Amount: None Present (0%) Exposed Structure Necrotic Amount: None Present (0%) Fascia Exposed: No Fat Layer (Subcutaneous Tissue) Exposed: Yes Tendon Exposed: No Muscle Exposed: No Joint Exposed: No Bone Exposed: No Periwound Skin Texture Younan, [name] ([id number]) Texture Color No Abnormalities Noted: No No Abnormalities Noted: No Callus: Yes Atrophie Blanche: No Crepitus: No Cyanosis: No Excoriation: No Ecchymosis: No Induration: No Erythema: No Rash: No Hemosiderin Staining: No Scarring: No Mottled: No Pallor: No Moisture Rubor: No No Abnormalities Noted: No Dry / Scaly: Yes Temperature / Pain Maceration: No Temperature: No Abnormality Tenderness on Palpation: Yes Wound Preparation Ulcer Cleansing: Rinsed/Irrigated with Saline Topical Anesthetic Applied: Other: lidocaince 4%, Treatment Notes Wound #4 (Distal Toe Second) Notes prisma to all wounds; left malleolus - SNAP VAC; left calcaneous - bordered foam dressing; toes - gauze and conform Electronic Signature(s) Signed: 12/13/2018 3:39:37 PM By: Secundino Ginger Entered By: Secundino Ginger on 12/13/2018  11:29:51 Tetreault, [name] ([id number]) -------------------------------------------------------------------------------- Wound Assessment Details Patient Name: [name] Date of Service: 12/13/2018 11:00 AM Medical Record Number: [id number] Patient Account Number: [id number] Date of Birth/Sex: [birth year] (85 y.o. F) Treating RN: Secundino Ginger Primary Care Hermina Barnard: Grayland Ormond Other Clinician: Referring Lister Brizzi: Grayland Ormond Treating Neeley Sedivy/Extender: STONE III, HOYT Weeks in Treatment: 70 Wound Status Wound Number: 5 Primary Pressure Ulcer Etiology: Wound Location: Toe Great - Distal Wound Open Wounding Event: Footwear Injury Status: Date Acquired: 12/03/2018 Comorbid Cataracts, Congestive Heart Failure, Weeks Of Treatment: 1 History: Hypertension, Osteoarthritis, Neuropathy Clustered Wound: No Photos Photo Uploaded By: Secundino Ginger on 12/13/2018 12:47:51 Wound Measurements Length: (cm) 0.9 % Reduction Width: (cm) 0.5 % Reduction Depth: (cm) 0.1 Epitheliali Area: (cm) 0.353 Tunneling: Volume: (cm) 0.035 Underminin in Area: 0% in Volume: 0% zation: None No g: No Wound Description Classification: Category/Stage II Foul Odor A Wound Margin: Flat and Intact Slough/Fibr Exudate Amount: Small Exudate Type: Serous Exudate Color: amber fter Cleansing: No ino No Wound Bed Granulation Amount: None Present (0%) Exposed Structure Necrotic Amount: Medium (34-66%) Fascia Exposed: No Necrotic Quality: Eschar, Adherent Slough Fat Layer (Subcutaneous Tissue) Exposed: Yes Tendon Exposed: No Muscle Exposed: No Joint Exposed: No Bone Exposed: No Periwound Skin Texture Ferryman, [name] ([id number]) Texture Color No Abnormalities Noted: No No Abnormalities Noted: No Callus: Yes Atrophie Blanche: No Crepitus: No Cyanosis: No Excoriation: No Ecchymosis: No Induration: No Erythema: No Rash: No Hemosiderin Staining: No Scarring: No Mottled: No Pallor:  No Moisture Rubor: No No Abnormalities Noted: No Dry / Scaly: Yes Temperature / Pain Maceration: No Temperature: No Abnormality Wound Preparation Ulcer Cleansing: Rinsed/Irrigated with Saline Topical Anesthetic Applied: Other: lidocaine 4%, Treatment Notes Wound #5 (Distal Toe Great) Notes prisma to all wounds; left malleolus - SNAP VAC; left calcaneous - bordered foam dressing; toes - gauze and conform Electronic Signature(s) Signed: 12/13/2018 3:39:37 PM By: Secundino Ginger Entered By: Secundino Ginger on 12/13/2018 11:32:44 Tirone, [name] ([id number]) -------------------------------------------------------------------------------- Vitals Details Patient Name: [name] Date of Service: 12/13/2018 11:00  AM Medical Record Number: [id number] Patient Account Number: [id number] Date of Birth/Sex: 1923/05/17 (85 y.o. F) Treating RN: Secundino Ginger Primary Care Leola Fiore: Grayland Ormond Other Clinician: Referring Shondell Fabel: Grayland Ormond Treating Afton Mikelson/Extender: STONE III, HOYT Weeks in Treatment: 70 Vital Signs Time Taken: 11:10 Temperature (F): 98.3 Height (in): 65 Pulse (bpm): 85 Weight (lbs): 154.3 Respiratory Rate (breaths/min): 18 Body Mass Index (BMI): 25.7 Blood Pressure (mmHg): 116/52 Reference Range: 80 - 120 mg / dl Electronic Signature(s) Signed: 12/13/2018 3:39:37 PM By: Secundino Ginger Entered BySecundino Ginger on 12/13/2018 11:12:19

## 2018-12-20 ENCOUNTER — Encounter: Payer: Medicare Other | Admitting: Physician Assistant

## 2018-12-20 DIAGNOSIS — L89514 Pressure ulcer of right ankle, stage 4: Secondary | ICD-10-CM | POA: Diagnosis not present

## 2018-12-23 ENCOUNTER — Encounter: Payer: Self-pay | Admitting: Podiatry

## 2018-12-23 ENCOUNTER — Encounter: Payer: Medicare Other | Admitting: Physician Assistant

## 2018-12-23 ENCOUNTER — Ambulatory Visit (INDEPENDENT_AMBULATORY_CARE_PROVIDER_SITE_OTHER): Payer: Medicare Other | Admitting: Podiatry

## 2018-12-23 DIAGNOSIS — M79676 Pain in unspecified toe(s): Secondary | ICD-10-CM

## 2018-12-23 DIAGNOSIS — B351 Tinea unguium: Secondary | ICD-10-CM | POA: Diagnosis not present

## 2018-12-23 DIAGNOSIS — I739 Peripheral vascular disease, unspecified: Secondary | ICD-10-CM

## 2018-12-23 DIAGNOSIS — L89514 Pressure ulcer of right ankle, stage 4: Secondary | ICD-10-CM | POA: Diagnosis not present

## 2018-12-23 DIAGNOSIS — R238 Other skin changes: Secondary | ICD-10-CM

## 2018-12-23 DIAGNOSIS — L909 Atrophic disorder of skin, unspecified: Secondary | ICD-10-CM

## 2018-12-23 NOTE — Progress Notes (Signed)
Complaint:  Visit Type: Patient returns to my office for continued preventative foot care services. Complaint: Patient states" my nails have grown long and thick and become painful to walk and wear shoes".  She is under care for multiple ulcers both feet and the ulcer provider is concerned about second toenail right foot. Patient has been diagnosed with PVD and is being treated for legs ulcers  B/L. The patient presents for preventative foot care services. No changes to ROS  Podiatric Exam: Vascular: dorsalis pedis and posterior tibial pulses are weakly  palpable bilateral. Capillary return is immediate. Temperature gradient is WNL. Skin turgor WNL  Sensorium: Normal Semmes Weinstein monofilament test. Normal tactile sensation bilaterally. Nail Exam: Pt has thick disfigured discolored nails with subungual debris noted bilateral entire nail hallux through fifth toenails Ulcer Exam: Ulcer on posterior aspect left feel  Ulcer lateral malleolus right ankle.  Bandage present right hallux since this toe ulcer is under treatment.   Orthopedic Exam: Muscle tone and strength are WNL. No limitations in general ROM. No crepitus or effusions noted. Foot type and digits show no abnormalities. Bony prominences are unremarkable. Skin: No Porokeratosis. No infection or ulcers.  Bony prominence over 5th toe right foot.  No infection.  Diagnosis:  Onychomycosis, , Pain in right toe, pain in left toes  Treatment & Plan Procedures and Treatment: Consent by patient was obtained for treatment procedures.   Debridement of mycotic and hypertrophic toenails, 1 through 5 bilateral and clearing of subungual debris. No ulceration, no infection noted. She is under treatment for ulcers the bandage that was covering the right hallux was removed per recommendation of her female companion  after talking to the wound care center.  Neosporin dry sterile dressing was reapplied to the right hallux. Return Visit-Office Procedure: Patient  instructed to return to the office for a follow up visit 3 months for continued evaluation and treatment.    Gardiner Barefoot DPM

## 2018-12-26 NOTE — Progress Notes (Signed)
MACON, LESESNE ([id number]) Visit Report for 12/23/2018 Arrival Information Details Patient Name: [name], [name]. Date of Service: 12/23/2018 3:15 PM Medical Record Number: [id number] Patient Account Number: [id number] Date of Birth/Sex: 1923-03-30 (85 y.o. F) Treating RN: Cornell Barman Primary Care Kamree Wiens: Grayland Ormond Other Clinician: Referring Tashawn Greff: Grayland Ormond Treating Patrice Moates/Extender: Melburn Hake, HOYT Weeks in Treatment: 50 Visit Information History Since Last Visit Added or deleted any medications: No Patient Arrived: Walker Any new allergies or adverse reactions: No Arrival Time: 15:05 Had a fall or experienced change in No Accompanied By: daughter activities of daily living that may affect Transfer Assistance: None risk of falls: Patient Identification Verified: Yes Signs or symptoms of abuse/neglect since last visito No Secondary Verification Process Yes Hospitalized since last visit: No Completed: Implantable device outside of the clinic excluding No Patient Requires Transmission-Based No cellular tissue based products placed in the center Precautions: since last visit: Patient Has Alerts: Yes Has Dressing in Place as Prescribed: No Patient Alerts: ABI AVVS 03/29/18 L Pain Present Now: No 1.05 R 1.03, TBI L .56, R .85 ABI AVVS 07/23/18 L .95 R 1.26 Electronic Signature(s) Signed: 12/23/2018 5:04:52 PM By: Gretta Cool, BSN, RN, CWS, Kim RN, BSN Entered By: Gretta Cool, BSN, RN, CWS, Kim on 12/23/2018 15:26:38 Zell, [name] ([id number]) -------------------------------------------------------------------------------- Encounter Discharge Information Details Patient Name: [name] Date of Service: 12/23/2018 3:15 PM Medical Record Number: [id number] Patient Account Number: [id number] Date of Birth/Sex: [birth year] (85 y.o. F) Treating RN: Harold Barban Primary Care Bailie Christenbury: Grayland Ormond Other Clinician: Referring Dariona Postma: Grayland Ormond Treating Anacleto Batterman/Extender: Melburn Hake, HOYT Weeks in Treatment: 35 Encounter Discharge Information Items Post Procedure Vitals Discharge Condition: Stable Temperature (F): 98.0 Ambulatory Status: Walker Pulse (bpm): 67 Discharge Destination: Home Respiratory Rate (breaths/min): 16 Transportation: Private Auto Blood Pressure (mmHg): 126/58 Accompanied By: daughter Schedule Follow-up Appointment: Yes Clinical Summary of Care: Electronic Signature(s) Signed: 12/23/2018 5:13:49 PM By: Harold Barban Entered By: Harold Barban on 12/23/2018 16:12:30 Shiley, [name] ([id number]) -------------------------------------------------------------------------------- Lower Extremity Assessment Details Patient Name: [name] Date of Service: 12/23/2018 3:15 PM Medical Record Number: [id number] Patient Account Number: [id number] Date of Birth/Sex: January 07, 1923 (85 y.o. F) Treating RN: Cornell Barman Primary Care Venisa Frampton: Grayland Ormond Other Clinician: Referring Devonda Pequignot: Grayland Ormond Treating Rosiland Sen/Extender: Melburn Hake, HOYT Weeks in Treatment: 71 Edema Assessment Assessed: [Left: No] [Right: No] Edema: [Left: N] [Right: o] Vascular Assessment Pulses: Dorsalis Pedis Palpable: [Right:Yes] Posterior Tibial Extremity colors, hair growth, and conditions: Extremity Color: [Right:Normal] Hair Growth on Extremity: [Right:No] Temperature of Extremity: [Right:Warm] Capillary Refill: [Right:< 3 seconds] Toe Nail Assessment Left: Right: Thick: Yes Discolored: No Deformed: No Improper Length and Hygiene: No Electronic Signature(s) Signed: 12/23/2018 5:04:52 PM By: Gretta Cool, BSN, RN, CWS, Kim RN, BSN Entered By: Gretta Cool, BSN, RN, CWS, Kim on 12/23/2018 15:27:55 Shelton, [name] ([id number]) -------------------------------------------------------------------------------- Multi Wound Chart Details Patient Name: [name] Date of Service: 12/23/2018 3:15 PM Medical  Record Number: [id number] Patient Account Number: [id number] Date of Birth/Sex: 11/20/1923 (85 y.o. F) Treating RN: Harold Barban Primary Care Kimberlie Csaszar: Grayland Ormond Other Clinician: Referring Novalyn Lajara: Grayland Ormond Treating Marley Pakula/Extender: STONE III, HOYT Weeks in Treatment: 71 Vital Signs Height(in): 65 Pulse(bpm): 67 Weight(lbs): 154.3 Blood Pressure(mmHg): 126/58 Body Mass Index(BMI): 26 Temperature(F): 98.0 Respiratory Rate 16 (breaths/min): Photos: [N/A:N/A] Wound Location: Distal Toe Second Distal Toe Great N/A Wounding Event: Pressure Injury Footwear Injury N/A Primary Etiology: Pressure Ulcer Pressure Ulcer N/A Date Acquired: 10/23/2018 12/03/2018 N/A Weeks of Treatment: 4 2 N/A Wound Status: Open  Open N/A Measurements L x W x D 0.2x0.3x0.4 0.4x0.4x0.1 N/A (cm) Area (cm) : 0.047 0.126 N/A Volume (cm) : 0.019 0.013 N/A % Reduction in Area: 33.80% 64.30% N/A % Reduction in Volume: -171.40% 62.90% N/A Classification: Category/Stage II Category/Stage II N/A Periwound Skin Texture: No Abnormalities Noted No Abnormalities Noted N/A Periwound Skin Moisture: No Abnormalities Noted No Abnormalities Noted N/A Periwound Skin Color: No Abnormalities Noted No Abnormalities Noted N/A Tenderness on Palpation: No No N/A Treatment Notes LEONE, MOBLEY ([id number]) Electronic Signature(s) Signed: 12/23/2018 5:13:49 PM By: Harold Barban Entered By: Harold Barban on 12/23/2018 15:59:18 Warriner, [name] ([id number]) -------------------------------------------------------------------------------- Multi-Disciplinary Care Plan Details Patient Name: [name] Date of Service: 12/23/2018 3:15 PM Medical Record Number: [id number] Patient Account Number: [id number] Date of Birth/Sex: [birth year] (85 y.o. F) Treating RN: Harold Barban Primary Care Larsen Dungan: Grayland Ormond Other Clinician: Referring Jahki Witham: Grayland Ormond Treating Ammar Moffatt/Extender:  Melburn Hake, HOYT Weeks in Treatment: 39 Active Inactive Abuse / Safety / Falls / Self Care Management Nursing Diagnoses: Potential for falls Goals: Patient will not experience any injury related to falls Date Initiated: 08/10/2017 Target Resolution Date: 11/10/2017 Goal Status: Active Interventions: Assess Activities of Daily Living upon admission and as needed Assess fall risk on admission and as needed Assess: immobility, friction, shearing, incontinence upon admission and as needed Notes: Nutrition Nursing Diagnoses: Imbalanced nutrition Potential for alteratiion in Nutrition/Potential for imbalanced nutrition Goals: Patient/caregiver agrees to and verbalizes understanding of need to use nutritional supplements and/or vitamins as prescribed Date Initiated: 08/10/2017 Target Resolution Date: 12/08/2017 Goal Status: Active Interventions: Assess patient nutrition upon admission and as needed per policy Notes: Orientation to the Wound Care Program Nursing Diagnoses: Knowledge deficit related to the wound healing center program Goals: Patient/caregiver will verbalize understanding of the [facility] Date Initiated: 08/10/2017 Target Resolution Date: 09/08/2017 Goal Status: Active SERIAH, BROTZMAN ([id number]) Interventions: Provide education on orientation to the wound center Notes: Pain, Acute or Chronic Nursing Diagnoses: Pain, acute or chronic: actual or potential Potential alteration in comfort, pain Goals: Patient/caregiver will verbalize adequate pain control between visits Date Initiated: 08/10/2017 Target Resolution Date: 12/08/2017 Goal Status: Active Interventions: Complete pain assessment as per visit requirements Notes: Wound/Skin Impairment Nursing Diagnoses: Impaired tissue integrity Knowledge deficit related to ulceration/compromised skin integrity Goals: Ulcer/skin breakdown will have a volume reduction of 80% by week 12 Date Initiated:  08/10/2017 Target Resolution Date: 12/01/2017 Goal Status: Active Interventions: Assess patient/caregiver ability to perform ulcer/skin care regimen upon admission and as needed Notes: Electronic Signature(s) Signed: 12/23/2018 5:13:49 PM By: Harold Barban Entered By: Harold Barban on 12/23/2018 15:58:47 Heinz, [name] ([id number]) -------------------------------------------------------------------------------- Pain Assessment Details Patient Name: [name] Date of Service: 12/23/2018 3:15 PM Medical Record Number: [id number] Patient Account Number: [id number] Date of Birth/Sex: May 16, 1923 (85 y.o. F) Treating RN: Cornell Barman Primary Care Warwick Nick: Grayland Ormond Other Clinician: Referring Kenyana Husak: Grayland Ormond Treating Sofya Moustafa/Extender: Melburn Hake, HOYT Weeks in Treatment: 86 Active Problems Location of Pain Severity and Description of Pain Patient Has Paino No Site Locations Pain Management and Medication Current Pain Management: Electronic Signature(s) Signed: 12/23/2018 5:04:52 PM By: Gretta Cool, BSN, RN, CWS, Kim RN, BSN Entered By: Gretta Cool, BSN, RN, CWS, Kim on 12/23/2018 15:26:44 [name] ([id number]) -------------------------------------------------------------------------------- Patient/Caregiver Education Details Patient Name: [name] Date of Service: 12/23/2018 3:15 PM Medical Record Number: [id number] Patient Account Number: [id number] Date of Birth/Gender: 25-Jun-1923 (85 y.o. F) Treating RN: Harold Barban Primary Care Physician: Grayland Ormond Other Clinician: Referring Physician: Venetia Maxon,  JASON Treating Physician/Extender: Worthy Keeler Weeks in Treatment: 13 Education Assessment Education Provided To: Patient Education Topics Provided Wound/Skin Impairment: Handouts: Caring for Your Ulcer Methods: Demonstration, Explain/Verbal Responses: State content correctly Electronic Signature(s) Signed: 12/23/2018 5:13:49 PM By:  Harold Barban Entered By: Harold Barban on 12/23/2018 16:12:35 Wiacek, [name] ([id number]) -------------------------------------------------------------------------------- Wound Assessment Details Patient Name: [name] Date of Service: 12/23/2018 3:15 PM Medical Record Number: [id number] Patient Account Number: [id number] Date of Birth/Sex: May 25, 1923 (85 y.o. F) Treating RN: Cornell Barman Primary Care Lashawndra Lampkins: Grayland Ormond Other Clinician: Referring Joandy Burget: Grayland Ormond Treating Naliah Eddington/Extender: Melburn Hake, HOYT Weeks in Treatment: 71 Wound Status Wound Number: 4 Primary Etiology: Pressure Ulcer Wound Location: Distal Toe Second Wound Status: Open Wounding Event: Pressure Injury Date Acquired: 10/23/2018 Weeks Of Treatment: 4 Clustered Wound: No Photos Photo Uploaded By: Gretta Cool, BSN, RN, CWS, Kim on 12/23/2018 15:36:52 Wound Measurements Length: (cm) 0.2 Width: (cm) 0.3 Depth: (cm) 0.4 Area: (cm) 0.047 Volume: (cm) 0.019 % Reduction in Area: 33.8% % Reduction in Volume: -171.4% Wound Description Classification: Category/Stage II Periwound Skin Texture Texture Color No Abnormalities Noted: No No Abnormalities Noted: No Moisture No Abnormalities Noted: No Electronic Signature(s) Signed: 12/23/2018 5:04:52 PM By: Gretta Cool, BSN, RN, CWS, Kim RN, BSN Entered By: Gretta Cool, BSN, RN, CWS, Kim on 12/23/2018 15:08:07 Aulds, [name] ([id number]) -------------------------------------------------------------------------------- Wound Assessment Details Patient Name: [name] Date of Service: 12/23/2018 3:15 PM Medical Record Number: [id number] Patient Account Number: [id number] Date of Birth/Sex: Jan 31, 1923 (85 y.o. F) Treating RN: Cornell Barman Primary Care Marykathleen Russi: Grayland Ormond Other Clinician: Referring Braidon Chermak: Grayland Ormond Treating Charee Tumblin/Extender: STONE III, HOYT Weeks in Treatment: 71 Wound Status Wound Number: 5 Primary  Etiology: Pressure Ulcer Wound Location: Distal Toe Great Wound Status: Open Wounding Event: Footwear Injury Date Acquired: 12/03/2018 Weeks Of Treatment: 2 Clustered Wound: No Photos Photo Uploaded By: Gretta Cool, BSN, RN, CWS, Kim on 12/23/2018 15:37:21 Wound Measurements Length: (cm) 0.4 Width: (cm) 0.4 Depth: (cm) 0.1 Area: (cm) 0.126 Volume: (cm) 0.013 % Reduction in Area: 64.3% % Reduction in Volume: 62.9% Wound Description Classification: Category/Stage II Periwound Skin Texture Texture Color No Abnormalities Noted: No No Abnormalities Noted: No Moisture No Abnormalities Noted: No Electronic Signature(s) Signed: 12/23/2018 5:04:52 PM By: Gretta Cool, BSN, RN, CWS, Kim RN, BSN Entered By: Gretta Cool, BSN, RN, CWS, Kim on 12/23/2018 15:08:08 Stoffers, [name] ([id number]) -------------------------------------------------------------------------------- Vitals Details Patient Name: [name] Date of Service: 12/23/2018 3:15 PM Medical Record Number: [id number] Patient Account Number: [id number] Date of Birth/Sex: 12/30/1922 (85 y.o. F) Treating RN: Cornell Barman Primary Care Nolawi Kanady: Grayland Ormond Other Clinician: Referring Lylianna Fraiser: Grayland Ormond Treating Elvan Ebron/Extender: Melburn Hake, HOYT Weeks in Treatment: 106 Vital Signs Time Taken: 15:20 Temperature (F): 98.0 Height (in): 65 Pulse (bpm): 67 Weight (lbs): 154.3 Respiratory Rate (breaths/min): 16 Body Mass Index (BMI): 25.7 Blood Pressure (mmHg): 126/58 Reference Range: 80 - 120 mg / dl Electronic Signature(s) Signed: 12/23/2018 5:04:52 PM By: Gretta Cool, BSN, RN, CWS, Kim RN, BSN Entered By: Gretta Cool, BSN, RN, CWS, Kim on 12/23/2018 15:27:11

## 2018-12-26 NOTE — Progress Notes (Signed)
GALILEA, QUITO ([id number]) Visit Report for 12/20/2018 Arrival Information Details Patient Name: [name], [name] Date of Service: 12/20/2018 9:30 AM Medical Record Number: [id number] Patient Account Number: [id number] Date of Birth/Sex: [birth year] (85 y.o. F) Treating RN: Montey Hora Primary Care Essie Lagunes: Grayland Ormond Other Clinician: Referring Christianne Zacher: Grayland Ormond Treating Kwane Rohl/Extender: Melburn Hake, HOYT Weeks in Treatment: 96 Visit Information History Since Last Visit Added or deleted any medications: No Patient Arrived: Walker Any new allergies or adverse reactions: No Arrival Time: 09:31 Had a fall or experienced change in No Accompanied By: daughter activities of daily living that may affect Transfer Assistance: None risk of falls: Patient Identification Verified: Yes Signs or symptoms of abuse/neglect since last visito No Secondary Verification Process Yes Hospitalized since last visit: No Completed: Implantable device outside of the clinic excluding No Patient Requires Transmission-Based No cellular tissue based products placed in the center Precautions: since last visit: Patient Has Alerts: Yes Has Dressing in Place as Prescribed: Yes Patient Alerts: ABI AVVS 03/29/18 L Pain Present Now: No 1.05 R 1.03, TBI L .56, R .85 ABI AVVS 07/23/18 L .95 R 1.26 Electronic Signature(s) Signed: 12/20/2018 3:49:07 PM By: Lorine Bears RCP, RRT, CHT Entered By: Lorine Bears on 12/20/2018 09:33:02 Ohm, [name] ([id number]) -------------------------------------------------------------------------------- Encounter Discharge Information Details Patient Name: [name] Date of Service: 12/20/2018 9:30 AM Medical Record Number: [id number] Patient Account Number: [id number] Date of Birth/Sex: February 07, 1923 (85 y.o. F) Treating RN: Montey Hora Primary Care Jaymee Tilson: Grayland Ormond Other Clinician: Referring Lashawne Dura:  Grayland Ormond Treating Crystin Lechtenberg/Extender: Melburn Hake, HOYT Weeks in Treatment: 53 Encounter Discharge Information Items Post Procedure Vitals Discharge Condition: Stable Temperature (F): 98.4 Ambulatory Status: Walker Pulse (bpm): 85 Discharge Destination: Home Respiratory Rate (breaths/min): 16 Transportation: Private Auto Blood Pressure (mmHg): 115/80 Accompanied By: dtr Schedule Follow-up Appointment: Yes Clinical Summary of Care: Electronic Signature(s) Signed: 12/20/2018 11:04:04 AM By: Montey Hora Entered By: Montey Hora on 12/20/2018 11:04:03 Bocanegra, [name] ([id number]) -------------------------------------------------------------------------------- Lower Extremity Assessment Details Patient Name: [name] Date of Service: 12/20/2018 9:30 AM Medical Record Number: [id number] Patient Account Number: [id number] Date of Birth/Sex: Apr 22, 1923 (85 y.o. F) Treating RN: Secundino Ginger Primary Care Merary Garguilo: Grayland Ormond Other Clinician: Referring Jaydalynn Olivero: Grayland Ormond Treating Alvia Jablonski/Extender: Melburn Hake, HOYT Weeks in Treatment: 71 Edema Assessment Assessed: [Left: No] [Right: No] [Left: Edema] [Right: :] Calf Left: Right: Point of Measurement: 30 cm From Medial Instep 29 cm 28.5 cm Ankle Left: Right: Point of Measurement: 12 cm From Medial Instep 19 cm 17 cm Vascular Assessment Pulses: Dorsalis Pedis Palpable: [Left:Yes] [Right:Yes] Posterior Tibial Extremity colors, hair growth, and conditions: Extremity Color: [Left:Hyperpigmented] [Right:Hyperpigmented] Capillary Refill: [Left:< 3 seconds] [Right:< 3 seconds] Toe Nail Assessment Left: Right: Thick: Yes Yes Discolored: Yes Yes Deformed: No No Improper Length and Hygiene: No No Electronic Signature(s) Signed: 12/20/2018 11:50:24 AM By: Secundino Ginger Entered By: Secundino Ginger on 12/20/2018 09:57:15 Niemczyk, [name]  ([id number]) -------------------------------------------------------------------------------- Multi Wound Chart Details Patient Name: [name] Date of Service: 12/20/2018 9:30 AM Medical Record Number: [id number] Patient Account Number: [id number] Date of Birth/Sex: June 27, 1923 (85 y.o. F) Treating RN: Montey Hora Primary Care Nithya Meriweather: Grayland Ormond Other Clinician: Referring Jeiden Daughtridge: Grayland Ormond Treating Anela Bensman/Extender: STONE III, HOYT Weeks in Treatment: 71 Vital Signs Height(in): 65 Pulse(bpm): 85 Weight(lbs): 154.3 Blood Pressure(mmHg): 115/80 Body Mass Index(BMI): 26 Temperature(F): 98.4 Respiratory Rate 18 (breaths/min): Photos: [2:No Photos] [3:No Photos] [4:No Photos] Wound Location: [2:Left Malleolus - Lateral] [3:Left Calcaneus] [4:Toe Second - Distal] Wounding Event: [  2:Gradually Appeared] [3:Gradually Appeared] [4:Pressure Injury] 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] [3:Pressure Ulcer] [4:Pressure Ulcer] 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] [3:N/A] [4:N/A] Comorbid History: [2:Cataracts, Congestive Heart Failure, Hypertension, Osteoarthritis, Neuropathy] [3:Cataracts, Congestive Heart Failure, Hypertension, Osteoarthritis, Neuropathy] [4:Cataracts, Congestive Heart Failure, Hypertension, Osteoarthritis,  Neuropathy] Date Acquired: [2:04/12/2018] [3:10/04/2018] [4:10/23/2018] Weeks of Treatment: [2:35] [3:10] [4:4] Wound Status: [2:Open] [3:Open] [4:Open] Measurements L x W x D [2:0.8x0.7x0.5] [3:1.5x1.7x0.1] [4:0.2x0.3x0.4] (cm) Area (cm) : [2:0.44] [3:2.003] [4:0.047] Volume (cm) : [2:0.22] [3:0.2] [4:0.019] % Reduction in Area: [2:43.90%] [3:27.10%] [4:33.80%] % Reduction in Volume: [2:-178.50%] [3:63.60%] [4:-171.40%] Classification: [2:Category/Stage IV] [3:Category/Stage II] [4:Category/Stage II] Exudate Amount: [2:Medium] [3:Small] [4:Small] Exudate Type: [2:Serous] [3:Serous] [4:Serous] Exudate Color: [2:amber]  [3:amber] [4:amber] Wound Margin: [2:Epibole] [3:Indistinct, nonvisible] [4:Distinct, outline attached] Granulation Amount: [2:None Present (0%)] [3:Small (1-33%)] [4:None Present (0%)] Necrotic Amount: [2:Small (1-33%)] [3:Large (67-100%)] [4:None Present (0%)] Necrotic Tissue: [2:Adherent Slough] [3:Eschar, Adherent Slough] [4:N/A] Exposed Structures: [2:Fat Layer (Subcutaneous Tissue) Exposed: Yes Fascia: No Tendon: No Muscle: No Joint: No Bone: No] [3:Fat Layer (Subcutaneous Tissue) Exposed: Yes Fascia: No Tendon: No Muscle: No Joint: No Bone: No] [4:Fat Layer (Subcutaneous Tissue) Exposed: Yes  Fascia: No Tendon: No Muscle: No Joint: No Bone: No] Epithelialization: [2:None] [3:None] [4:None] Periwound Skin Texture: [2:Excoriation: No Induration: No Callus: No Crepitus: No] [3:Excoriation: No Induration: No Callus: No Crepitus: No] [4:Callus: Yes Excoriation: No Induration: No Crepitus: No] Rash: No Rash: No Rash: No Scarring: No Scarring: No Scarring: No Periwound Skin Moisture: Maceration: Yes Maceration: Yes Dry/Scaly: Yes Dry/Scaly: No Dry/Scaly: No Maceration: No Periwound Skin Color: Hemosiderin Staining: Yes Atrophie Blanche: No Atrophie Blanche: No Atrophie Blanche: No Cyanosis: No Cyanosis: No Cyanosis: No Ecchymosis: No Ecchymosis: No Ecchymosis: No Erythema: No Erythema: No Erythema: No Hemosiderin Staining: No Hemosiderin Staining: No Mottled: No Mottled: No Mottled: No Pallor: No Pallor: No Pallor: No Rubor: No Rubor: No Rubor: No Temperature: No Abnormality No Abnormality No Abnormality Tenderness on Palpation: Yes Yes Yes Wound Preparation: Ulcer Cleansing: Ulcer Cleansing: Ulcer Cleansing: Rinsed/Irrigated with Saline Rinsed/Irrigated with Saline Rinsed/Irrigated with Saline Topical Anesthetic Applied: Topical Anesthetic Applied: Topical Anesthetic Applied: Other: lidocaine 4% Other: lidocaine 4% Other: lidocaince 4% Wound Number: 5 N/A  N/A Photos: No Photos N/A N/A Wound Location: Toe Great - Distal N/A N/A Wounding Event: Footwear Injury N/A N/A Primary Etiology: Pressure Ulcer N/A N/A Secondary Etiology: N/A N/A N/A Comorbid History: Cataracts, Congestive Heart N/A N/A Failure, Hypertension, Osteoarthritis, Neuropathy Date Acquired: 12/03/2018 N/A N/A Weeks of Treatment: 2 N/A N/A Wound Status: Open N/A N/A Measurements L x W x D 0.4x0.4x0.1 N/A N/A (cm) Area (cm) : 0.126 N/A N/A Volume (cm) : 0.013 N/A N/A % Reduction in Area: 64.30% N/A N/A % Reduction in Volume: 62.90% N/A N/A Classification: Category/Stage II N/A N/A Exudate Amount: Small N/A N/A Exudate Type: Serous N/A N/A Exudate Color: amber N/A N/A Wound Margin: Flat and Intact N/A N/A Granulation Amount: None Present (0%) N/A N/A Necrotic Amount: Small (1-33%) N/A N/A Necrotic Tissue: Eschar, Adherent Slough N/A N/A Exposed Structures: Fat Layer (Subcutaneous N/A N/A Tissue) Exposed: Yes Fascia: No Tendon: No Muscle: No Joint: No Bone: No Epithelialization: None N/A N/A Periwound Skin Texture: Callus: Yes N/A N/A Excoriation: No Induration: No Leachman, [name] ([id number]) Crepitus: No Rash: No Scarring: No Periwound Skin Moisture: Dry/Scaly: Yes N/A N/A Maceration: No Periwound Skin Color: Atrophie Blanche: No N/A N/A Cyanosis: No Ecchymosis: No Erythema: No Hemosiderin Staining: No Mottled: No Pallor: No Rubor: No Temperature: No Abnormality N/A N/A Tenderness on Palpation:  No N/A N/A Wound Preparation: Ulcer Cleansing: N/A N/A Rinsed/Irrigated with Saline Topical Anesthetic Applied: Other: lidocaine 4% Treatment Notes Electronic Signature(s) Signed: 12/20/2018 5:32:05 PM By: Montey Hora Entered By: Montey Hora on 12/20/2018 10:15:28 [name] ([id number]) -------------------------------------------------------------------------------- Multi-Disciplinary Care Plan Details Patient Name: [name] Date of Service: 12/20/2018 9:30 AM Medical Record Number: [id number] Patient Account Number: [id number] Date of Birth/Sex: 02/06/1923 (85 y.o. F) Treating RN: Montey Hora Primary Care Breelle Hollywood: Grayland Ormond Other Clinician: Referring Tashica Provencio: Grayland Ormond Treating Borghild Thaker/Extender: Melburn Hake, HOYT Weeks in Treatment: 60 Active Inactive Abuse / Safety / Falls / Self Care Management Nursing Diagnoses: Potential for falls Goals: Patient will not experience any injury related to falls Date Initiated: 08/10/2017 Target Resolution Date: 11/10/2017 Goal Status: Active Interventions: Assess Activities of Daily Living upon admission and as needed Assess fall risk on admission and as needed Assess: immobility, friction, shearing, incontinence upon admission and as needed Notes: Nutrition Nursing Diagnoses: Imbalanced nutrition Potential for alteratiion in Nutrition/Potential for imbalanced nutrition Goals: Patient/caregiver agrees to and verbalizes understanding of need to use nutritional supplements and/or vitamins as prescribed Date Initiated: 08/10/2017 Target Resolution Date: 12/08/2017 Goal Status: Active Interventions: Assess patient nutrition upon admission and as needed per policy Notes: Orientation to the Wound Care Program Nursing Diagnoses: Knowledge deficit related to the wound healing center program Goals: Patient/caregiver will verbalize understanding of the [facility] Date Initiated: 08/10/2017 Target Resolution Date: 09/08/2017 Goal Status: Active JENAY, MORICI ([id number]) Interventions: Provide education on orientation to the wound center Notes: Pain, Acute or Chronic Nursing Diagnoses: Pain, acute or chronic: actual or potential Potential alteration in comfort, pain Goals: Patient/caregiver will verbalize adequate pain control between visits Date Initiated: 08/10/2017 Target Resolution Date: 12/08/2017 Goal Status:  Active Interventions: Complete pain assessment as per visit requirements Notes: Wound/Skin Impairment Nursing Diagnoses: Impaired tissue integrity Knowledge deficit related to ulceration/compromised skin integrity Goals: Ulcer/skin breakdown will have a volume reduction of 80% by week 12 Date Initiated: 08/10/2017 Target Resolution Date: 12/01/2017 Goal Status: Active Interventions: Assess patient/caregiver ability to perform ulcer/skin care regimen upon admission and as needed Notes: Electronic Signature(s) Signed: 12/20/2018 5:32:05 PM By: Montey Hora Entered By: Montey Hora on 12/20/2018 10:15:18 Chaffin, [name] ([id number]) -------------------------------------------------------------------------------- Pain Assessment Details Patient Name: [name] Date of Service: 12/20/2018 9:30 AM Medical Record Number: [id number] Patient Account Number: [id number] Date of Birth/Sex: 17-Jun-1923 (85 y.o. F) Treating RN: Montey Hora Primary Care Kaylinn Dedic: Grayland Ormond Other Clinician: Referring Juana Montini: Grayland Ormond Treating Joellen Tullos/Extender: Melburn Hake, HOYT Weeks in Treatment: 54 Active Problems Location of Pain Severity and Description of Pain Patient Has Paino No Site Locations Pain Management and Medication Current Pain Management: Electronic Signature(s) Signed: 12/20/2018 3:49:07 PM By: Paulla Fore, RRT, CHT Signed: 12/20/2018 5:32:05 PM By: Montey Hora Entered By: Lorine Bears on 12/20/2018 09:33:09 Ambrosio, [name] ([id number]) -------------------------------------------------------------------------------- Patient/Caregiver Education Details Patient Name: [name] Date of Service: 12/20/2018 9:30 AM Medical Record Number: [id number] Patient Account Number: [id number] Date of Birth/Gender: 21-Jan-1923 (85 y.o. F) Treating RN: Montey Hora Primary Care Physician: Grayland Ormond Other  Clinician: Referring Physician: Grayland Ormond Treating Physician/Extender: Sharalyn Ink in Treatment: 48 Education Assessment Education Provided To: Patient and Caregiver Education Topics Provided Pressure: Handouts: Other: pressure relief of ankle areas Methods: Explain/Verbal Responses: State content correctly Electronic Signature(s) Signed: 12/20/2018 5:32:05 PM By: Montey Hora Entered By: Montey Hora on 12/20/2018 10:40:10 Delsanto, [name] ([id number]) -------------------------------------------------------------------------------- Wound Assessment Details Patient Name:  Nicasio, [name] Date of Service: 12/20/2018 9:30 AM Medical Record Number: [id number] Patient Account Number: [id number] Date of Birth/Sex: August 07, 1923 (85 y.o. F) Treating RN: Secundino Ginger Primary Care Aland Chestnutt: Grayland Ormond Other Clinician: Referring Eric Morganti: Grayland Ormond Treating Karlita Lichtman/Extender: STONE III, HOYT Weeks in Treatment: 71 Wound Status Wound Number: 2 Primary Pressure Ulcer Etiology: Wound Location: Left Malleolus - Lateral Secondary Arterial Insufficiency Ulcer Wounding Event: Gradually Appeared Etiology: Date Acquired: 04/12/2018 Wound Status: Open Weeks Of Treatment: 35 Comorbid Cataracts, Congestive Heart Failure, Clustered Wound: No History: Hypertension, Osteoarthritis, Neuropathy Photos Photo Uploaded By: Secundino Ginger on 12/20/2018 10:31:42 Wound Measurements Length: (cm) 0.8 % Reductio Width: (cm) 0.7 % Reductio Depth: (cm) 0.5 Epithelial Area: (cm) 0.44 Tunneling Volume: (cm) 0.22 Undermini n in Area: 43.9% n in Volume: -178.5% ization: None : No ng: No Wound Description Classification: Category/Stage IV Foul Odor Wound Margin: Epibole Slough/Fib Exudate Amount: Medium Exudate Type: Serous Exudate Color: amber After Cleansing: No rino Yes Wound Bed Granulation Amount: None Present (0%) Exposed Structure Necrotic Amount: Small  (1-33%) Fascia Exposed: No Necrotic Quality: Adherent Slough Fat Layer (Subcutaneous Tissue) Exposed: Yes Tendon Exposed: No Muscle Exposed: No Joint Exposed: No Bone Exposed: No Periwound Skin Texture Froman, [name] ([id number]) Texture Color No Abnormalities Noted: No No Abnormalities Noted: No Callus: No Atrophie Blanche: No Crepitus: No Cyanosis: No Excoriation: No Ecchymosis: No Induration: No Erythema: No Rash: No Hemosiderin Staining: Yes Scarring: No Mottled: No Pallor: No Moisture Rubor: No No Abnormalities Noted: No Dry / Scaly: No Temperature / Pain Maceration: Yes Temperature: No Abnormality Tenderness on Palpation: Yes Wound Preparation Ulcer Cleansing: Rinsed/Irrigated with Saline Topical Anesthetic Applied: Other: lidocaine 4%, Treatment Notes Wound #2 (Left, Lateral Malleolus) Notes toes and heel - prisma,; toes - gauze and conform; heel - bordered foam dressing; left malleolus - SNAP VAC, right malleolus - bactroban and bordered foam dressing for protection Electronic Signature(s) Signed: 12/20/2018 11:50:24 AM By: Secundino Ginger Entered By: Secundino Ginger on 12/20/2018 09:45:45 Wahlstrom, [name] ([id number]) -------------------------------------------------------------------------------- Wound Assessment Details Patient Name: [name] Date of Service: 12/20/2018 9:30 AM Medical Record Number: [id number] Patient Account Number: [id number] Date of Birth/Sex: 01-11-1923 (85 y.o. F) Treating RN: Secundino Ginger Primary Care Elouise Divelbiss: Grayland Ormond Other Clinician: Referring Marcelyn Ruppe: Grayland Ormond Treating Refujio Haymer/Extender: STONE III, HOYT Weeks in Treatment: 71 Wound Status Wound Number: 3 Primary Pressure Ulcer Etiology: Wound Location: Left Calcaneus Wound Open Wounding Event: Gradually Appeared Status: Date Acquired: 10/04/2018 Comorbid Cataracts, Congestive Heart Failure, Weeks Of Treatment: 10 History: Hypertension,  Osteoarthritis, Neuropathy Clustered Wound: No Photos Photo Uploaded By: Secundino Ginger on 12/20/2018 10:31:43 Wound Measurements Length: (cm) 1.5 Width: (cm) 1.7 Depth: (cm) 0.1 Area: (cm) 2.003 Volume: (cm) 0.2 % Reduction in Area: 27.1% % Reduction in Volume: 63.6% Epithelialization: None Tunneling: No Undermining: No Wound Description Classification: Category/Stage II Foul Odor A Wound Margin: Indistinct, nonvisible Slough/Fibr Exudate Amount: Small Exudate Type: Serous Exudate Color: amber fter Cleansing: No ino Yes Wound Bed Granulation Amount: Small (1-33%) Exposed Structure Granulation Quality: Hyper-granulation Fascia Exposed: No Necrotic Amount: Large (67-100%) Fat Layer (Subcutaneous Tissue) Exposed: Yes Necrotic Quality: Eschar, Adherent Slough Tendon Exposed: No Muscle Exposed: No Joint Exposed: No Bone Exposed: No Periwound Skin Texture Biffle, [name] ([id number]) Texture Color No Abnormalities Noted: No No Abnormalities Noted: No Callus: No Atrophie Blanche: No Crepitus: No Cyanosis: No Excoriation: No Ecchymosis: No Induration: No Erythema: No Rash: No Hemosiderin Staining: No Scarring: No Mottled: No Pallor: No Moisture Rubor: No No Abnormalities Noted: No  Dry / Scaly: No Temperature / Pain Maceration: Yes Temperature: No Abnormality Tenderness on Palpation: Yes Wound Preparation Ulcer Cleansing: Rinsed/Irrigated with Saline Topical Anesthetic Applied: Other: lidocaine 4%, Treatment Notes Wound #3 (Left Calcaneus) Notes toes and heel - prisma,; toes - gauze and conform; heel - bordered foam dressing; left malleolus - SNAP VAC, right malleolus - bactroban and bordered foam dressing for protection Electronic Signature(s) Signed: 12/20/2018 11:50:24 AM By: Secundino Ginger Entered By: Secundino Ginger on 12/20/2018 09:49:17 Mccluskey, [name] ([id number]) -------------------------------------------------------------------------------- Wound  Assessment Details Patient Name: [name] Date of Service: 12/20/2018 9:30 AM Medical Record Number: [id number] Patient Account Number: [id number] Date of Birth/Sex: [birth year] (85 y.o. F) Treating RN: Secundino Ginger Primary Care Dusti Tetro: Grayland Ormond Other Clinician: Referring Laksh Hinners: Grayland Ormond Treating Glenford Garis/Extender: STONE III, HOYT Weeks in Treatment: 71 Wound Status Wound Number: 4 Primary Pressure Ulcer Etiology: Wound Location: Toe Second - Distal Wound Open Wounding Event: Pressure Injury Status: Date Acquired: 10/23/2018 Comorbid Cataracts, Congestive Heart Failure, Weeks Of Treatment: 4 History: Hypertension, Osteoarthritis, Neuropathy Clustered Wound: No Photos Photo Uploaded By: Secundino Ginger on 12/20/2018 10:33:11 Wound Measurements Length: (cm) 0.2 Width: (cm) 0.3 Depth: (cm) 0.4 Area: (cm) 0.047 Volume: (cm) 0.019 % Reduction in Area: 33.8% % Reduction in Volume: -171.4% Epithelialization: None Tunneling: No Undermining: No Wound Description Classification: Category/Stage II Foul Odor A Wound Margin: Distinct, outline attached Slough/Fibr Exudate Amount: Small Exudate Type: Serous Exudate Color: amber fter Cleansing: No ino Yes Wound Bed Granulation Amount: None Present (0%) Exposed Structure Necrotic Amount: None Present (0%) Fascia Exposed: No Fat Layer (Subcutaneous Tissue) Exposed: Yes Tendon Exposed: No Muscle Exposed: No Joint Exposed: No Bone Exposed: No Periwound Skin Texture Cory, [name] ([id number]) Texture Color No Abnormalities Noted: No No Abnormalities Noted: No Callus: Yes Atrophie Blanche: No Crepitus: No Cyanosis: No Excoriation: No Ecchymosis: No Induration: No Erythema: No Rash: No Hemosiderin Staining: No Scarring: No Mottled: No Pallor: No Moisture Rubor: No No Abnormalities Noted: No Dry / Scaly: Yes Temperature / Pain Maceration: No Temperature: No Abnormality Tenderness on  Palpation: Yes Wound Preparation Ulcer Cleansing: Rinsed/Irrigated with Saline Topical Anesthetic Applied: Other: lidocaince 4%, Electronic Signature(s) Signed: 12/20/2018 11:50:24 AM By: Secundino Ginger Entered By: Secundino Ginger on 12/20/2018 09:53:16 Garrelts, [name] ([id number]) -------------------------------------------------------------------------------- Wound Assessment Details Patient Name: [name] Date of Service: 12/20/2018 9:30 AM Medical Record Number: [id number] Patient Account Number: [id number] Date of Birth/Sex: [birth year] (85 y.o. F) Treating RN: Secundino Ginger Primary Care Lon Klippel: Grayland Ormond Other Clinician: Referring Kloie Whiting: Grayland Ormond Treating Yassine Brunsman/Extender: STONE III, HOYT Weeks in Treatment: 71 Wound Status Wound Number: 5 Primary Pressure Ulcer Etiology: Wound Location: Toe Great - Distal Wound Open Wounding Event: Footwear Injury Status: Date Acquired: 12/03/2018 Comorbid Cataracts, Congestive Heart Failure, Weeks Of Treatment: 2 History: Hypertension, Osteoarthritis, Neuropathy Clustered Wound: No Photos Photo Uploaded By: Secundino Ginger on 12/20/2018 10:32:43 Wound Measurements Length: (cm) 0.4 Width: (cm) 0.4 Depth: (cm) 0.1 Area: (cm) 0.126 Volume: (cm) 0.013 % Reduction in Area: 64.3% % Reduction in Volume: 62.9% Epithelialization: None Tunneling: No Undermining: No Wound Description Classification: Category/Stage II Foul Odor A Wound Margin: Flat and Intact Slough/Fibr Exudate Amount: Small Exudate Type: Serous Exudate Color: amber fter Cleansing: No ino No Wound Bed Granulation Amount: None Present (0%) Exposed Structure Necrotic Amount: Small (1-33%) Fascia Exposed: No Necrotic Quality: Eschar, Adherent Slough Fat Layer (Subcutaneous Tissue) Exposed: Yes Tendon Exposed: No Muscle Exposed: No Joint Exposed: No Bone Exposed: No Periwound Skin Texture Fawcett, [name] ([id number])  Texture Color No  Abnormalities Noted: No No Abnormalities Noted: No Callus: Yes Atrophie Blanche: No Crepitus: No Cyanosis: No Excoriation: No Ecchymosis: No Induration: No Erythema: No Rash: No Hemosiderin Staining: No Scarring: No Mottled: No Pallor: No Moisture Rubor: No No Abnormalities Noted: No Dry / Scaly: Yes Temperature / Pain Maceration: No Temperature: No Abnormality Wound Preparation Ulcer Cleansing: Rinsed/Irrigated with Saline Topical Anesthetic Applied: Other: lidocaine 4%, Electronic Signature(s) Signed: 12/20/2018 11:50:24 AM By: Secundino Ginger Entered By: Secundino Ginger on 12/20/2018 09:54:59 Kostelnik, [name] ([id number]) -------------------------------------------------------------------------------- Vitals Details Patient Name: [name] Date of Service: 12/20/2018 9:30 AM Medical Record Number: [id number] Patient Account Number: [id number] Date of Birth/Sex: 28-Mar-1923 (85 y.o. F) Treating RN: Montey Hora Primary Care Laketha Leopard: Grayland Ormond Other Clinician: Referring Leannah Guse: Grayland Ormond Treating Kinneth Fujiwara/Extender: Melburn Hake, HOYT Weeks in Treatment: 28 Vital Signs Time Taken: 09:33 Temperature (F): 98.4 Height (in): 65 Pulse (bpm): 85 Weight (lbs): 154.3 Respiratory Rate (breaths/min): 18 Body Mass Index (BMI): 25.7 Blood Pressure (mmHg): 115/80 Reference Range: 80 - 120 mg / dl Electronic Signature(s) Signed: 12/20/2018 3:49:07 PM By: Lorine Bears RCP, RRT, CHT Entered By: Lorine Bears on 12/20/2018 09:35:45

## 2018-12-27 ENCOUNTER — Encounter: Payer: Medicare Other | Admitting: Physician Assistant

## 2018-12-27 DIAGNOSIS — L89514 Pressure ulcer of right ankle, stage 4: Secondary | ICD-10-CM | POA: Diagnosis not present

## 2018-12-27 NOTE — Progress Notes (Signed)
EVALIN, SHAWHAN ([id number]) Visit Report for 12/20/2018 Chief Complaint Document Details Patient Name: [name], [name]. Date of Service: 12/20/2018 9:30 AM Medical Record Number: [id number] Patient Account Number: [id number] Date of Birth/Sex: 1923-04-30 (85 y.o. F) Treating RN: Montey Hora Primary Care Provider: Grayland Ormond Other Clinician: Referring Provider: Grayland Ormond Treating Provider/Extender: Melburn Hake, Murriel Eidem Weeks in Treatment: 102 Information Obtained from: Patient Chief Complaint Patient is here for right lateral malleolus, right 2nd distal toe and left lateral malleolus ulcer Electronic Signature(s) Signed: 12/26/2018 9:23:56 AM By: Worthy Keeler PA-C Entered By: Worthy Keeler on 12/20/2018 09:38:02 Bertling, [name] ([id number]) -------------------------------------------------------------------------------- Debridement Details Patient Name: [name] Date of Service: 12/20/2018 9:30 AM Medical Record Number: [id number] Patient Account Number: [id number] Date of Birth/Sex: Feb 03, 1923 (85 y.o. F) Treating RN: Montey Hora Primary Care Provider: Grayland Ormond Other Clinician: Referring Provider: Grayland Ormond Treating Provider/Extender: Melburn Hake, Nichlas Pitera Weeks in Treatment: 13 Debridement Performed for Wound #5 Distal Toe Great Assessment: Performed By: Physician STONE III, Willye Javier E., PA-C Debridement Type: Debridement Level of Consciousness (Pre- Awake and Alert procedure): Pre-procedure Verification/Time Yes - 10:18 Out Taken: Start Time: 10:18 Pain Control: Lidocaine 4% Topical Solution Total Area Debrided (L x W): 0.4 (cm) x 0.4 (cm) = 0.16 (cm) Tissue and other material Viable, Non-Viable, Slough, Subcutaneous, Slough debrided: Level: Skin/Subcutaneous Tissue Debridement Description: Excisional Instrument: Curette Bleeding: Moderate Hemostasis Achieved: Silver Nitrate End Time: 10:23 Procedural Pain: 0 Post Procedural  Pain: 0 Response to Treatment: Procedure was tolerated well Level of Consciousness Awake and Alert (Post-procedure): Post Debridement Measurements of Total Wound Length: (cm) 0.4 Stage: Category/Stage II Width: (cm) 0.4 Depth: (cm) 0.2 Volume: (cm) 0.025 Character of Wound/Ulcer Post Improved Debridement: Post Procedure Diagnosis Same as Pre-procedure Electronic Signature(s) Signed: 12/20/2018 5:32:05 PM By: Montey Hora Signed: 12/26/2018 9:23:56 AM By: Worthy Keeler PA-C Entered By: Montey Hora on 12/20/2018 10:23:25 [name] ([id number]) -------------------------------------------------------------------------------- Debridement Details Patient Name: [name] Date of Service: 12/20/2018 9:30 AM Medical Record Number: [id number] Patient Account Number: [id number] Date of Birth/Sex: [birth year] (85 y.o. F) Treating RN: Montey Hora Primary Care Provider: Grayland Ormond Other Clinician: Referring Provider: Grayland Ormond Treating Provider/Extender: Melburn Hake, Bronislaus Verdell Weeks in Treatment: 44 Debridement Performed for Wound #3 Left Calcaneus Assessment: Performed By: Physician STONE III, Hayleigh Bawa E., PA-C Debridement Type: Debridement Level of Consciousness (Pre- Awake and Alert procedure): Pre-procedure Verification/Time Yes - 10:25 Out Taken: Start Time: 10:25 Pain Control: Lidocaine 4% Topical Solution Total Area Debrided (L x W): 1.5 (cm) x 1.7 (cm) = 2.55 (cm) Tissue and other material Viable, Non-Viable, Slough, Subcutaneous, Biofilm, Slough debrided: Level: Skin/Subcutaneous Tissue Debridement Description: Excisional Instrument: Curette Bleeding: Minimum Hemostasis Achieved: Pressure End Time: 10:27 Procedural Pain: 0 Post Procedural Pain: 0 Response to Treatment: Procedure was tolerated well Level of Consciousness Awake and Alert (Post-procedure): Post Debridement Measurements of Total Wound Length: (cm) 1.5 Stage:  Category/Stage II Width: (cm) 1.7 Depth: (cm) 0.2 Volume: (cm) 0.401 Character of Wound/Ulcer Post Improved Debridement: Post Procedure Diagnosis Same as Pre-procedure Electronic Signature(s) Signed: 12/20/2018 5:32:05 PM By: Montey Hora Signed: 12/26/2018 9:23:56 AM By: Worthy Keeler PA-C Entered By: Montey Hora on 12/20/2018 10:27:34 Uzelac, [name] ([id number]) -------------------------------------------------------------------------------- HPI Details Patient Name: [name] Date of Service: 12/20/2018 9:30 AM Medical Record Number: [id number] Patient Account Number: [id number] Date of Birth/Sex: [birth year] (85 y.o. 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 Dr. Amalia Hailey the podiatrist saw her on 07/20/2017 and sent her to the wound center for possible hyperbaric oxygen therapy. past medical history of peripheral vascular disease, varicose veins, status post appendectomy, basal cell carcinoma excision from the left leg, cholecystectomy, pacemaker placement, right lower extremity angiography done by Dr. dew in March 2017 with placement of a stent. there is also note of a successful ablation of the right small saphenous vein done which was reviewed by ultrasound on 10/24/2016. the patient had a right small saphenous vein ablation done on 10/20/2016. The patient has never been a smoker. She has been seen by Dr. Corene Cornea dew the vascular surgeon who most recently saw her on 06/15/2017 for evaluation of ongoing problems with right leg swelling. She had a lower extremity arterial duplex  examination done(02/13/17) which showed patent distal right superficial femoral artery stent and above-the-knee popliteal stent without evidence of restenosis. The ABI was more than 1.3 on the right and more than 1.3 on the left. This was consistent with noncompressible arteries due to medial calcification. The right great toe pressure and PPG waveforms are within normal limits and the left great toe pressure and PPG waveforms are decreased. he recommended she continue to wear her compression stockings and continue with elevation. She is scheduled to have a noninvasive arterial study in the near future 08/16/2017 -- had a lower extremity arterial duplex examination done which showed patent distal right superficial femoral artery stent and above-the-knee popliteal stent without evidence of restenosis. The ABI was more than 1.3 on the right and more than 1.3 on the left. This was consistent with noncompressible arteries due to medial calcification. The right great toe pressure and PPG waveforms are within normal limits and the left great toe pressure and PPG waveforms are decreased. the x-ray of the right ankle has not yet been done 08/24/2017 -- had a right ankle x-ray -- IMPRESSION:1. No fracture, bone lesion or evidence of osteomyelitis. 2. Lateral soft tissue swelling with a soft tissue ulcer. she has not yet seen the vascular surgeon for review 08/31/17 on evaluation today patient's wound appears to be showing signs of improvement. She still with her appointment with vascular in order to review her results of her vascular study and then determine if any intervention would be recommended at that time. No fevers, chills, nausea, or vomiting noted at this time. She has been tolerating the dressing changes without complication. 09/28/17 on evaluation today patient's wound appears to show signs of good improvement in regard to the granulation tissue which is surfacing. There is still a layer of  slough covering the wound and the posterior portion is still significantly deeper than the anterior nonetheless there has been some good sign of things moving towards the better. She is going to go back to Dr. dew for reevaluation to ensure her blood flow is still appropriate. That will be before her next evaluation with [place] next week. No fevers, chills, nausea, or vomiting noted at this time. Patient does have some discomfort rated to be a 3-4/10 depending on activity specifically cleansing the wound makes it worse. 10/05/2017 -- the patient was seen by Dr. Lucky Cowboy last week and noninvasive studies showed a normal right ABI with brisk triphasic waveforms consistent with no arterial insufficiency including normal  digital pressures. The duplex showed a patent distal right SFA stent and the proximal SFA was also normal. He was pleased with her test and thought she should have enough of perfusion for normal wound healing. He would see her back in 6 months time. 12/21/17 on evaluation today patient appears to be doing fairly well in regard to her right lateral ankle wound. Unfortunately the main issue that she is expansion at this point is that she is having some issues with what appears to be some cellulitis in the SATIN, BOAL. ([id number]) right anterior shin. She has also been noting a little bit of uncomfortable feeling especially last night and her ankle area. I'm afraid that she made the developing a little bit of an infection. With that being said I think it is in the early stages. 12/28/17 on evaluation today patient's ankle appears to be doing excellent. She's making good progress at this point the cellulitis seems to have improved after last week's evaluation. Overall she is having no significant discomfort which is excellent news. She does have an appointment with Dr. dew on March 29, 2018 for reevaluation in regard to the stent he placed. She seems to have excellent blood flow in the right  lower extremity. 01/19/12 on evaluation today patient's wound appears to be doing very well. In fact she does not appear to require debridement at this point, there's no evidence of infection, and overall from the standpoint of the wound she seems to be doing very well. With that being said I believe that it may be time to switch to different dressing away from the [name] Dressing she tells me she does have a lot going on her friend actually passed away yesterday and she's also having a lot of issues with her husband this obviously is weighing heavy on her as far as your thoughts and concerns today. 01/25/18 on evaluation today patient appears to be doing fairly well in regard to her right lateral malleolus. She has been tolerating the dressing changes without complication. Overall I feel like this is definitely showing signs of improvement as far as how the overall appearance of the wound is there's also evidence of epithelium start to migrate over the granulation tissue. In general I think that she is progressing nicely as far as the wound is concerned. The only concern she really has is whether or not we can switch to every other week visits in order to avoid having as many appointments as her daughters have a difficult time getting her to her appointments as well as the patient's husband to his he is not doing very well at this point. 02/22/18 on evaluation today patient's right lateral malleolus ulcer appears to be doing great. She has been tolerating the dressing changes without complication. Overall you making excellent progress at this time. Patient is having no significant discomfort. 03/15/18 on evaluation today patient appears to be doing much more poorly in regard to her right lateral ankle ulcer at this point. Unfortunately since have last seen her her husband has passed just a few days ago is obviously weighed heavily on her her daughter also had surgery well she is with her today  as usual. There does not appear to be any evidence of infection she does seem to have significant contusion/deep tissue injury to the right lateral malleolus which was not noted previous when I saw her last. It's hard to tell of exactly when this injury occurred although during the time she was spending  the night in the hospital this may have been most likely. 03/22/18 on evaluation today patient appears to actually be doing very well in regard to her ulcer. She did unfortunately have a setback which was noted last week however the good news is we seem to be getting back on track and in fact the wound in the core did still have some necrotic tissue which will be addressed at this point today but in general I'm seeing signs that things are on the up and up. She is glad to hear this obviously she's been somewhat concerned that due to the how her wound digressed more recently. 03/29/18 on evaluation today patient appears to be doing fairly well in regard to her right lower extremity lateral malleolus ulcer. She unfortunately does have a new area of pressure injury over the inferior portion where the wound has opened up a little bit larger secondary to the pressure she seems to be getting. She does tell me sometimes when she sleeps at night that it actually hurts and does seem to be pushing on the area little bit more unfortunately. There does not appear to be any evidence of infection which is good news. She has been tolerating the dressing changes without complication. She also did have some bruising in the left second and third toes due to the fact that she may have bump this or injured it although she has neuropathy so she does not feel she did move recently that may have been where this came from. Nonetheless there does not appear to be any evidence of infection at this time. 04/12/18 on evaluation today patient's wound on the right lateral ankle actually appears to be doing a little bit better with a  lot of necrotic docking tissue centrally loosening up in clearing away. However she does have the beginnings of a deep tissue injury on the left lateral malleolus likely due to the fact we've been trying offload the right as much as we have. I think she may benefit from an assistive soft device to help with offloading and it looks like they're looking at one of the doughnut conditions that wraps around the lower leg to offload which I think will definitely do a good job. With that being said I think we definitely need to address this issue on the left before it becomes a wound. Patient is not having significant pain. 04/19/18 on evaluation today patient appears to be doing excellent in regard to the progress she's made with her right lateral ankle ulcer. The left ankle region which did show evidence of a deep tissue injury seems to be resolving there's little fluid noted underneath and a blister there's nothing open at this point in time overall I feel like this is progressing nicely which is good news. She does not seem to be having significant discomfort at this point which is also good news. 04/25/18-She is here in follow up evaluation for bilateral lateral malleolar ulcers. The right lateral malleolus ulcer with pale subcutaneous tissue exposure, central area of ulcer with tendon/periosteum exposed. The left lateral malleolus ulcer now with Natt, [name] ([id number]) central area of nonviable tissue, otherwise deep tissue injury. She is wearing compression wraps to the left lower extremity, she will place the right lower extremity compression wraps on when she gets home. She will be out of town over the weekend and return next week and follow-up appointment. She completed her doxycycline this morning 05/03/18 on evaluation today patient appears to be doing very  well in regard to her right lateral ankle ulcer in general. At least she's showing some signs of improvement in this regard.  Unfortunately she has some additional injury to the left lateral malleolus region which appears to be new likely even over the past several days. Again this determination is based on the overall appearance. With that being said the patient is obviously frustrated about this currently. 05/10/18-She is here in follow-up evaluation for bilateral lateral malleolar ulcers. She states she has purchased offloading shoes/boots and they will arrive tomorrow. She was asked to bring them in the office at next week's appointment so her provider is aware of product being utilized. She continues to sleep on right or left side, she has been encouraged to sleep on her back. The right lateral malleolus ulcer is precariously close to peri-osteum; will order xray. The left lateral malleolus ulcer is improved. Will switch back to santyl; she will follow up next week. 05/17/18 on evaluation today patient actually appears to be doing very well in regard to her malleolus her ulcers compared to last time I saw them. She does not seem to have as much in the way of contusion at this point which is great news. With that being said she does continue to have discomfort and I do believe that she is still continuing to benefit from the offloading/pressure reducing boots that were recommended. I think this is the key to trying to get this to heal up completely. 05/24/18 on evaluation today patient actually appears to be doing worse at this point in time unfortunately compared to her last week's evaluation. She is having really no increased pain which is good news unfortunately she does have more maceration in your theme and noted surrounding the right lateral ankle the left lateral ankle is not really is erythematous I do not see signs of the overt cellulitis on that side. Unfortunately the wounds do not seem to have shown any signs of improvement since the last evaluation. She also has significant swelling especially on the right  compared to previous some of this may be due to infection however also think that she may be served better while she has these wounds by compression wrapping versus continuing to use the Juxta-Lite for the time being. Especially with the amount of drainage that she is experiencing at this point. No fevers, chills, nausea, or vomiting noted at this time. 05/31/18 on evaluation today patient appears to actually be doing better in regard to her right lateral lower extremity ulcer specifically on the malleolus region. She has been tolerating the antibiotic without complication. With that being said she still continues to have issues but a little bit of redness although nothing like she what she was experiencing previous. She still continues to pressure to her ankle area she did get the problem on offloading boots unfortunately she will not wear them she states there too uncomfortable and she can't get in and out of the bed. Nonetheless at this point her wounds seem to be continually getting worse which is not what we want I'm getting somewhat concerned about her progress and how things are going to proceed if we do not intervene in some way shape or form. I therefore had a very lengthy conversation today about offloading yet again and even made a specific suggestion for switching her to a memory foam mattress and even gave the information for a specific one that they could look at getting if it was something that they were interested in  considering. She does not want to be considered for a hospital bed air mattress although honestly insurance would not cover it that she does not have any wounds on her trunk. 06/14/18 on evaluation today both wounds over the bilateral lateral malleolus her ulcers appear to be doing better there's no evidence of pressure injury at this point. She did get the foam mattress for her bed and this does seem to have been extremely beneficial for her in my pinion. Her daughter  states that she is having difficulty getting out of bed because of how soft it is. The patient also relates this to be. Nonetheless I do feel like she's actually doing better. Unfortunately right after and around the time she was getting the mattress she also sustained a fall when she got up to go pick up the phone and ended up injuring her right elbow she has 18 sut[address] in place. We are not caring for this currently although home health is going to be taking the sutures out shortly. Nonetheless this may be something that we need to evaluate going forward. It depends on how well it has or has not healed in the end. She also recently saw an orthopedic specialist for an injection in the right shoulder just before her fall unfortunately the fall seems to have worsened her pain. 06/21/18 on evaluation today patient appears to be doing about the same in regard to her lateral malleolus ulcers. Both appear to be just a little bit deeper but again we are clinging away the necrotic and dead tissue which I think is why this is progressing towards a deeper realm as opposed to improving from my measurement standpoint in that regard. Nonetheless she has been tolerating the dressing changes she absolutely hates the memory foam mattress topper that was obtained for her nonetheless I do believe this is still doing excellent as far as taking care of excess pressure in regard to the lateral malleolus regions. She in fact has no pressure injury that I see whereas in weeks past it was week by week I was constantly seeing new pressure injuries. Overall I think it has been very beneficial for her. 07/03/18; patient arrives in my clinic today. She has deep punched out areas over her bilateral lateral malleoli. The area on the right has some more depth. VIOLETTA, LAVALLE ([id number]) We spent a lot of time today talking about pressure relief for these areas. This started when her daughter asked for a prescription for a  memory foam mattress. I have never written a prescription for a mattress and I don't think insurances would pay for that on an ordinary bed. In any case he came up that she has foam boots that she refuses to wear. I would suggest going to these before any other offloading issues when she is in bed. They say she is meticulous about offloading this the rest of the day 07/10/18- She is seen in follow-up evaluation for bilateral, lateral malleolus ulcers. There is no improvement in the ulcers. She has purchased and is sleeping on a memory foam mattress/overlay, she has been using the offloading boots nightly over the past week. She has a follow up appointment with vascular medicine at the end of October, in my opinion this follow up should be expedited given her deterioration and suboptimal TBI results. We will order plain film xray of the left ankle as deeper structures are palpable; would consider having MRI, regardless of xray report(s). The ulcers will be treated with iodoflex/iodosorb,  she is unable to safely change the dressings daily with santyl. 07/19/18 on evaluation today patient appears to be doing in general visually well in regard to her bilateral lateral malleolus ulcers. She has been tolerating the dressing changes without complication which is good news. With that being said we did have an x-ray performed on 07/12/18 which revealed a slight loosen see in the lateral portion of the distal left fibula which may represent artifact but underline lytic destruction or osteomyelitis could not be excluded. MRI was recommended. With that being said we can see about getting the patient scheduled for an MRI to further evaluate this area. In fact we have that scheduled currently for August 20 19,019. 07/26/18 on evaluation today patient's wound on the right lateral ankle actually appears to be doing fairly well at this point in my pinion. She has made some good progress currently. With that being said  unfortunately in regard to the left lateral ankle ulcer this seems to be a little bit more problematic at this time. In fact as I further evaluated the situation she actually had bone exposed which is the first time that's been the case in the bone appear to be necrotic. Currently I did review patient's note from Dr. Bunnie Domino office with [facility]. He stated that ABI was 1.26 on the right and 0.95 on the left with good waveforms. Her perfusion is stable not reduced from previous studies and her digital waveforms were pretty good particularly on the right. His conclusion upon review of the note was that there was not much she could do to improve her perfusion and he felt she was adequate for wound healing. His suggestion was that she continued to see [place] and consider a synthetic skin graft if there was no underlying infection. He plans to see her back in six months or as needed. 08/01/18 on evaluation today patient appears to be doing better in regard to her right lateral ankle ulcer. Her left lateral ankle ulcer is about the same she still has bone involvement in evidence of necrosis. There does not appear to be evidence of infection at this time On the right lateral lower extremity. I have started her on the Augmentin she picked this up and started this yesterday. This is to get her through until she sees infectious disease which is scheduled for 08/12/18. 08/06/18 on evaluation today patient appears to be doing rather well considering my discussion with patient's daughter at the end of last week. The area which was marked where she had erythema seems to be improved and this is good news. With that being said overall the patient seems to be making good improvement when it comes to the overall appearance of the right lateral ankle ulcer although this has been slow she at least is coming around in this regard. Unfortunately in regard to the left lateral ankle ulcer this is  osteomyelitis based on the pathology report as well is bone culture. Nonetheless we are still waiting CT scan. Unfortunately the MRI we originally ordered cannot be performed as the patient is a pacemaker which I had overlooked. Nonetheless we are working on the CT scan approval and scheduling as of now. She did go to the hospital over the weekend and was placed on IV Cefzo for a couple of days. Fortunately this seems to have improved the erythema quite significantly which is good news. There does not appear to be any evidence of worsening infection at this time. She did  have some bleeding after the last debridement therefore I did not perform any sharp debridement in regard to left lateral ankle at this point. Patient has been approved for a snap vac for the right lateral ankle. 08/14/18; the patient with wounds over her bilateral lateral malleoli. The area on the right actually looks quite good. Been using a snap back on this area. Healthy granulation and appears to be filling in. Unfortunately the area on the left is really problematic. 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 Also noted to have cellulitis. She saw Dr. Novella Olive of infectious disease today and was put on linezolid. We are able to verify this with her pharmacy. She is completed the Augmentin that she was already on. We've been using Iodoflex to this area 08/23/18 on evaluation today patient's wounds both actually appear to be doing better compared to my prior evaluations. Fortunately she showing signs of good improvement in regard to the overall wound status especially where were using the snap vac on the right. In regard to left lateral malleolus the wound bed actually appears to be much cleaner than previously noted. I do not feel any phone directly probed during evaluation today and though there is tendon noted this does not appear Attridge, [name] ([id number]) to be necrotic it's actually fairly good as far as the overall appearance of the tendon is concerned. In general the wound bed actually appears to be doing significantly better than it was previous. Patient is currently in the care of [name] and I did review that note today. He actually has her on two weeks of linezolid and then following the patient will be on 1-2 months of Keflex. That is the plan currently. She has been on antibiotics therapy as prescribed by myself initially starting on July 30, 2018 and has been on that continuously up to this point. 08/30/18 on evaluation today patient actually appears to be doing much better in regard to her right lateral malleolus ulcer. She has been tolerating the dressing changes specifically the snap vac without complication although she did have some issues with the seal currently. Apparently there was some trouble with getting it to maintain over the past week past Sunday. Nonetheless overall the wound appears better in regard to the right lateral malleolus region. In regard to left lateral malleolus this actually show some signs of additional granulation although there still tendon noted in the base of the wound this appears to be healthy not necrotic in any way whatsoever. We are considering potentially using a snap vac for the left lateral malleolus as well the product wrap from [employer], [place], was present in the clinic today we're going to see this patient I did have her come in with me after obtaining consent from the patient and her daughter in order to look at the wound and see if there's any recommendation one way or another as to whether or not they felt the snapback could be beneficial for the left lateral malleolus region. But the conclusion was that it might be but that this is definitely a little bit deeper wound than what traditionally would be utilized for a snap vac. 09/06/18 on evaluation today patient actually appears  to be doing excellent in my pinion in regard to both ankle ulcers. She has been tolerating the dressing changes without complication which is great news. Specifically we have been using the snap vac. In regard  to the right ankle I'm not even sure that this is going to be necessary for today and following as the wound has filled in quite nicely. In regard to the left ankle I do believe that we're seeing excellent epithelialization from the edge as well as granulation in the central portion the tendon is still exposed but there's no evidence of necrotic bone and in general I feel like the patient has made excellent progress even compared to last week with just one week of the snap vac. 09/11/18; this is a patient who has wounds on her bilateral lateral malleoli. Initially both of these were deep stage IV wounds in the setting of chronic arterial insufficiency. She has been revascularized. As I understand think she been using snap vacs to both of these wounds however the area on the right became more superficial and currently she is only using it on the left. Using silver collagen on the right and silver collagen under the back on the left I believe 09/19/18 on evaluation today patient actually appears to be doing very well in regard to her lateral malleolus or ulcers bilaterally. She has been tolerating the dressing changes without complication. Fortunately there does not appear to be any evidence of infection at this time. Overall I feel like she is improving in an excellent manner and I'm very pleased with the fact that everything seems to be turning towards the better for her. This has obviously been a long road. 09/27/18 on evaluation today patient actually appears to be doing very well in regard to her bilateral lateral malleolus ulcers. She has been tolerating the dressing changes without complication. Fortunately there does not appear to be any evidence of infection at this time which is also  great news. No fevers, chills, nausea, or vomiting noted at this time. Overall I feel like she is doing excellent with the snap vac on the left malleolus. She had 40 mL of fluid collection over the past week. 10/04/18 on evaluation today patient actually appears to be doing well in regard to her bilateral lateral malleolus ulcers. She continues to tolerate the dressing changes without complication. One issue that I see is the snap vac on the left lateral malleolus which appears to have sealed off some fluid underlying this area and has not really allowed it to heal to the degree that I would like to see. For that reason I did suggest at this point we may want to pack a small piece of packing strip into this region to allow it to more effectively wick out fluid. 10/11/18 in general the patient today does not feel that she has been doing very well. She's been a little bit lethargic and subsequently is having bodyaches as well according to what she tells me today. With that being said overall she has been concerned with the fact that something may be worsening although to be honest her wounds really have not been appearing poorly. She does have a new ulcer on her left heel unfortunately. This may be pressure related. Nonetheless it seems to me to have potentially started at least as a blister I do not see any evidence of deep tissue injury. In regard to the left ankle the snap vac still seems to be causing the ceiling off of the deeper part of the wound which is in turn trapping fluid. I'm not extremely pleased with the overall appearance as far as progress from last week to this week therefore I'm gonna discontinue the snap vac  at this point. 10/18/18 patient unfortunately this point has not been feeling well for the past several days. She was seen by Grayland Ormond her primary care provider who is a Librarian, academic at [facility]. Subsequently she states that she's been very weak and generally  feeling malaise. No fevers, chills, nausea, or vomiting noted at this time. With that being said bloodwork was performed at the PCP office on the 11th of this month which showed a white blood cell count of 10.7. This was repeated today Mccowen, [name] ([id number]) and shows a white blood cell count of 12.4. This does show signs of worsening. Coupled with the fact that she is feeling worse and that her left ankle wound is not really showing signs of improvement I feel like this is an indication that the osteomyelitis is likely exacerbating not improving. Overall I think we may also want to check her C-reactive protein and sedimentation rate. Actually did call [facility] office this afternoon while the patient was in the office here with me. Subsequently based on the findings we discussed treatment possibilities and I think that it is appropriate for [place] to go ahead and initiate treatment with doxycycline which I'm going to do. Subsequently he did agree to see about adding a CRP and sedimentation rate to her orders. If that has not already been drawn to where they can run it they will contact the patient she can come back to have that check. They are in agreement with plan as far as the patient and her daughter are concerned. Nonetheless also think we need to get in touch with Dr. Henreitta Leber office to see about getting the patient scheduled with him as soon as possible. 11/08/18 on evaluation today patient presents for follow-up concerning her bilateral foot and ankle ulcers. I did do an extensive review of her chart in epic today. Subsequently she was seen by [name] he did initiate Cefepime IV antibiotic therapy. Subsequently she had some issues with her PICC line this had to be removed because it was coiled and then replaced. Fortunately that was now settled. Unfortunately she has continued have issues with her left heel as well as the issues that she is experiencing with her bilateral  lateral malleolus regions. I do believe however both areas seem to be doing a little bit better on evaluation today which is good news. No fevers, chills, nausea, or vomiting noted at this time. She actually has an angiogram schedule with Dr. dew on this coming Monday, November 11, 2018. Subsequently the patient states that she is feeling much better especially than what she was roughly 2 weeks ago. She actually had to cancel an appointment because she was feeling so poorly. No fevers, chills, nausea, or vomiting noted at this time. 11/15/18 on evaluation today patient actually is status post having had her angiogram with Dr. dew Monday, four days ago. 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 He had to go and work on several areas of the vasculature fortunately he was able to obtain no more than a 30% residual stenosis throughout post procedure. I reviewed this note today. I think this will definitely help with healing at this time. Fortunately there does not appear to be any signs of infection and I do feel like ratio already has a better appearance to it. 11/22/18 upon evaluation today patient actually appears to be doing very well in regard to her wounds in general. The right lateral malleolus  looks excellent the heel looks better in the left lateral malleolus also appears to be doing a little better. With that being said the right second toe actually appears to be open and training we been watching this is been dry and stable but now is open. 12/03/2018 Seen today for follow-up and management of multiple bilateral lower extremity wounds. New pressure injury of the great toe which is closed at this time. Wound of the right distal second toe appears larger today with deep undermining and a pocket of fluid present within the undermining region. Left and right malleolus is wounds are stable today with no signs and symptoms of infection.Denies any needs or concerns during  exam today. 12/13/18 on evaluation today patient appears to be doing somewhat better in regard to her left heel ulcer. She also seems to be completely healed in regard to the right lateral malleolus ulcer. The left malleolus ulcer is smaller what unfortunately the wounds which are new over the first and second toes of the right foot are what are most concerning at this point especially the second. Both areas did require sharp debridement today. 12/20/18 on evaluation today patient's wound actually appears to be doing better in regard to left lateral ankle and her right lateral ankle continues to remain healed. The hill ulcer on the left is improved. She does have improvement noted as well in regard to both toe ulcers. Overall I'm very pleased in this regard. No fevers, chills, nausea, or vomiting noted at this time. Electronic Signature(s) Signed: 12/26/2018 9:23:56 AM By: Worthy Keeler PA-C Entered By: Worthy Keeler on 12/26/2018 08:57:19 Vlcek, [name] ([id number]) -------------------------------------------------------------------------------- Physical Exam Details Patient Name: [name], [name] Date of Service: 12/20/2018 9:30 AM Medical Record Number: [id number] Patient Account Number: [id number] Date of Birth/Sex: 09-10-1923 (85 y.o. F) Treating RN: Montey Hora Primary Care Provider: Grayland Ormond Other Clinician: Referring Provider: Grayland Ormond Treating Provider/Extender: STONE III, Jailen Coward Weeks in Treatment: 34 Constitutional Well-nourished and well-hydrated in no acute distress. Respiratory normal breathing without difficulty. clear to auscultation bilaterally. Cardiovascular regular rate and rhythm with normal S1, S2. Psychiatric this patient is able to make decisions and demonstrates good insight into disease process. Alert and Oriented x 3. pleasant and cooperative. Notes Patient's right first toe did require sharp debridement today which was performed without  complication post debridement the wound. Appears to be doing much better. Overall I'm very pleased though I did have to use some silver nitrate to help with the hyper granular tissue at this point. Electronic Signature(s) Signed: 12/26/2018 9:23:56 AM By: Worthy Keeler PA-C Entered By: Worthy Keeler on 12/26/2018 08:57:43 Friedli, [name] ([id number]) -------------------------------------------------------------------------------- Physician Orders Details Patient Name: [name] Date of Service: 12/20/2018 9:30 AM Medical Record Number: [id number] Patient Account Number: [id number] Date of Birth/Sex: September 28, 1923 (85 y.o. F) Treating RN: Montey Hora Primary Care Provider: Grayland Ormond Other Clinician: Referring Provider: Grayland Ormond Treating Provider/Extender: Melburn Hake, Shahid Flori Weeks in Treatment: 70 Verbal / Phone Orders: No Diagnosis Coding ICD-10 Coding Code Description 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, stage 4 L89.524 Pressure ulcer of left ankle, stage 4 L89.623 Pressure ulcer of left heel, stage 3 L89.892 Pressure ulcer of other site, stage 2 I70.233 Atherosclerosis of native arteries of right leg with ulceration of ankle I70.243 Atherosclerosis of native arteries of left leg with ulceration of ankle I89.0 Lymphedema, not elsewhere classified B35.4 Tinea corporis 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 o Cleanse wound with mild soap and water o May Shower, gently pat wound dry prior to applying new dressing. Wound #3 Left Calcaneus o Clean wound with Normal Saline. o Cleanse wound with mild soap and water o May Shower, gently pat wound dry prior to applying new dressing. Wound #4 Distal Toe Second o Clean wound with Normal Saline. o Cleanse wound with mild soap and water o May Shower, gently pat wound dry prior to applying  new dressing. Wound #5 Distal Toe Great o Clean wound with Normal Saline. o Cleanse wound with mild soap and water o May Shower, gently pat wound dry prior to applying new dressing. Anesthetic (add to Medication List) Wound #2 Left,Lateral Malleolus o Topical Lidocaine 4% cream applied to wound bed prior to debridement (In Clinic Only). Wound #3 Left Calcaneus o Topical Lidocaine 4% cream applied to wound bed prior to debridement (In Clinic Only). Wound #4 Distal Toe Second o Topical Lidocaine 4% cream applied to wound bed prior to debridement (In Clinic Only). KRISHAUNA, SCHATZMAN ([id number]) Wound #5 Distal Toe Great o Topical Lidocaine 4% cream applied to wound bed prior to debridement (In Clinic Only). Primary Wound Dressing Wound #2 Left,Lateral Malleolus o Other: - SNAP VAC with prisma to tunnel Wound #3 Left Calcaneus o Silver Collagen Wound #4 Distal Toe Second o Silver Collagen Wound #5 Distal Toe Great o Silver Collagen Secondary Dressing Wound #3 Left Calcaneus o Boardered Foam Dressing - Please keep a bordered foam dressing on the right malleolus with bactroban Wound #4 Distal Toe Second o Conform/Kerlix Wound #5 Distal Toe Great o Conform/Kerlix Dressing Change Frequency Wound #2 Left,Lateral Malleolus o Change Dressing Monday, Wednesday, Friday Wound #3 Left Calcaneus o Change Dressing Monday, Wednesday, Friday Wound #4 Distal Toe Second o Change Dressing Monday, Wednesday, Friday Wound #5 Distal Toe Great o Change Dressing Monday, Wednesday, Friday Follow-up Appointments Wound #2 Left,Lateral Malleolus o Return Appointment in 1 week. Wound #3 Left Calcaneus o Return Appointment in 1 week. Wound #4 Distal Toe Second o Return Appointment in 1 week. Wound #5 Distal Toe Great o Return Appointment in 1 week. Edema Control Wound #2 Left,Lateral Malleolus Brigham, [name] ([id number]) o Patient to wear own  compression stockings o Patient to wear own Velcro compression garment. o Elevate legs to the level of the heart and pump ankles as often as possible Wound #3 Left Calcaneus o Patient to wear own compression stockings o Patient to wear own Velcro compression garment. o Elevate legs to the level of the heart and pump ankles as often as possible Wound #4 Distal Toe Second o Patient to wear own compression stockings o Patient to wear own Velcro compression garment. o Elevate legs to the level of the heart and pump ankles as often as possible Wound #5 Distal Toe Great o Patient to wear own compression stockings o Patient to wear own Velcro compression garment. o Elevate legs to the level of the heart and pump ankles as often as possible Off-Loading Wound #2 Left,Lateral Malleolus o Turn and reposition every 2 hours o Other: - do not put pressure on your ankles Wound #3 Left Calcaneus o Turn and reposition every 2 hours o Other: - do not put pressure on your ankles Wound #4 Distal Toe Second o Turn and reposition every 2 hours o Other: - do not put pressure on your ankles Wound #5 Distal Toe Great o Turn and reposition every 2 hours o Other: -  do not put pressure on your ankles Additional Orders / Instructions Wound #2 Left,Lateral Malleolus o Increase protein intake. o Increase protein intake. Wound #3 Left Calcaneus o Increase protein intake. o Increase protein intake. Wound #4 Distal Toe Second o Increase protein intake. o Increase protein intake. Wound #5 Distal Toe Great o Increase protein intake. o Increase protein intake. Home Health Wound #3 Left [place] Visits - [facility]. Please visit patient Monday, Wednesday, and Friday YETZALI, WELD ([id number]) o Home Health Nurse may visit PRN to address patientos wound care needs. o FACE TO FACE ENCOUNTER: MEDICARE and MEDICAID PATIENTS: I  certify that this patient is under my care and that I had a face-to-face encounter that meets the physician face-to-face encounter requirements with this patient on this date. The encounter with the patient was in whole or in part for the following MEDICAL CONDITION: (primary reason for [place]) MEDICAL NECESSITY: I certify, that based on my findings, NURSING services are a medically necessary home health service. HOME BOUND STATUS: I certify that my clinical findings support that this patient is homebound (i.e., Due to illness or injury, pt requires aid of supportive devices such as crutches, cane, wheelchairs, walkers, the use of special transportation or the assistance of another person to leave their place of residence. There is a normal inability to leave the home and doing so requires considerable and taxing effort. Other absences are for medical reasons / religious services and are infrequent or of short duration when for other reasons). o If current dressing causes regression in wound condition, may D/C ordered dressing product/s and apply Normal Saline Moist Dressing daily until next [place] / Other MD appointment. [place] of regression in wound condition at [phone]. o Please direct any NON-WOUND related issues/requests for orders to patient's Primary Care Physician Wound #4 Distal Toe [place] [facility]its - [facility]. Please visit patient Monday, Wednesday, and Friday o Home Health Nurse may visit PRN to address patientos wound care needs. o FACE TO FACE ENCOUNTER: MEDICARE and MEDICAID PATIENTS: I certify that this patient is under my care and that I had a face-to-face encounter that meets the physician face-to-face encounter requirements with this patient on this date. The encounter with the patient was in whole or in part for the following MEDICAL CONDITION: (primary reason for [place]) MEDICAL NECESSITY:  I certify, that based on my findings, NURSING services are a medically necessary home health service. HOME BOUND STATUS: I certify that my clinical findings support that this patient is homebound (i.e., Due to illness or injury, pt requires aid of supportive devices such as crutches, cane, wheelchairs, walkers, the use of special transportation or the assistance of another person to leave their place of residence. There is a normal inability to leave the home and doing so requires considerable and taxing effort. Other absences are for medical reasons / religious services and are infrequent or of short duration when for other reasons). o If current dressing causes regression in wound condition, may D/C ordered dressing product/s and apply Normal Saline Moist Dressing daily until next [place] / Other MD appointment. [place] of regression in wound condition at [phone]. o Please direct any NON-WOUND related issues/requests for orders to patient's Primary Care Physician Wound #5 Distal Toe [place] Visits - [facility]. Please visit patient Monday, Wednesday, and Friday o Home Health Nurse may visit PRN to address patientos wound  care needs. o FACE TO FACE ENCOUNTER: MEDICARE and MEDICAID PATIENTS: I certify that this patient is under my care and that I had a face-to-face encounter that meets the physician face-to-face encounter requirements with this patient on this date. The encounter with the patient was in whole or in part for the following MEDICAL CONDITION: (primary reason for [place]) MEDICAL NECESSITY: I certify, that based on my findings, NURSING services are a medically necessary home health service. HOME BOUND STATUS: I certify that my clinical findings support that this patient is homebound (i.e., Due to illness or injury, pt requires aid of supportive devices such as crutches, cane, wheelchairs, walkers, the use of  special transportation or the assistance of another person to leave their place of residence. There is a normal inability to leave the home and doing so requires considerable and taxing effort. Other absences are for medical reasons / religious services and are infrequent or of short duration when for other reasons). o If current dressing causes regression in wound condition, may D/C ordered dressing product/s and apply Normal Saline Moist Dressing daily until next [name] / Other MD appointment. [place] of regression in wound condition at [phone]. o Please direct any NON-WOUND related issues/requests for orders to patient's Primary Care Physician Electronic Signature(s) Signed: 12/20/2018 5:32:05 PM By: Montey Hora Signed: 12/26/2018 9:23:56 AM By: Worthy Keeler PA-C Entered By: Montey Hora on 12/20/2018 10:30:41 Turgeon, [name] ([id number]) Gruszka, [name] ([id number]) -------------------------------------------------------------------------------- Problem List Details Patient Name: PHOENICIA, PIRIE Date of Service: 12/20/2018 9:30 AM Medical Record Number: [id number] Patient Account Number: [id number] Date of Birth/Sex: Aug 11, 1923 (85 y.o. 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 08/10/2017 No Yes ankle I70.243 Atherosclerosis of native arteries of left leg with ulceration  of 07/10/2018 No Yes ankle I89.0 Lymphedema, not elsewhere classified 08/10/2017 No Yes B35.4 Tinea corporis 09/28/2017 No Yes 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, left ankle and foot 08/14/2018 No Yes Inactive Problems Resolved Problems KENNADEE, WALTHOUR ([id number]) ICD-10 Code Description Active Date Resolved Date S90.822A Blister (nonthermal), left foot, initial encounter 10/11/2018 10/11/2018 Electronic Signature(s) Signed: 12/26/2018 9:23:56 AM By: Worthy Keeler PA-C Entered By: Worthy Keeler on 12/20/2018 09:37:51 Schonberger, [name] ([id number]) -------------------------------------------------------------------------------- Progress Note Details Patient Name: [name] Date of Service: 12/20/2018 9:30 AM Medical Record Number: [id number] Patient Account Number: [id number] Date of Birth/Sex: 12/05/1922 (85 y.o. 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 Dr. Amalia Hailey the podiatrist saw her on 07/20/2017 and sent her to the wound center for possible hyperbaric oxygen therapy. past medical  history of peripheral vascular disease, varicose veins, status post appendectomy, basal cell carcinoma excision from the left leg, cholecystectomy, pacemaker placement, right lower extremity angiography done by Dr. dew in March 2017 with placement of a stent. there is also note of a successful ablation of the right small saphenous vein done which  was reviewed by ultrasound on 10/24/2016. the patient had a right small saphenous vein ablation done on 10/20/2016. The patient has never been a smoker. She has been seen by Dr. Corene Cornea dew the vascular surgeon who most recently saw her on 06/15/2017 for evaluation of ongoing problems with right leg swelling. She had a lower extremity arterial duplex examination done(02/13/17) which showed patent distal right superficial femoral artery stent and above-the-knee popliteal stent without evidence of restenosis. The ABI was more than 1.3 on the right and more than 1.3 on the left. This was consistent with noncompressible arteries due to medial calcification. The right great toe pressure and PPG waveforms are within normal limits and the left great toe pressure and PPG waveforms are decreased. he recommended she continue to wear her compression stockings and continue with elevation. She is scheduled to have a noninvasive arterial study in the near future 08/16/2017 -- had a lower extremity arterial duplex examination done which showed patent distal right superficial femoral artery stent and above-the-knee popliteal stent without evidence of restenosis. The ABI was more than 1.3 on the right and more than 1.3 on the left. This was consistent with noncompressible arteries due to medial calcification. The right great toe pressure and PPG waveforms are within normal limits and the left great toe pressure and PPG waveforms are decreased. the x-ray of the right ankle has not yet been done 08/24/2017 -- had a right ankle x-ray -- IMPRESSION:1. No fracture, bone lesion or evidence of osteomyelitis. 2. Lateral soft tissue swelling with a soft tissue ulcer. she has not yet seen the vascular surgeon for review 08/31/17 on evaluation today patient's wound appears to be showing signs of improvement. She still with her appointment with vascular in order to review her results of her vascular study and then determine if  any intervention would be recommended at that time. No fevers, chills, nausea, or vomiting noted at this time. She has been tolerating the dressing changes without complication. 09/28/17 on evaluation today patient's wound appears to show signs of good improvement in regard to the granulation tissue which is surfacing. There is still a layer of slough covering the wound and the posterior portion is still significantly deeper than the anterior nonetheless there has been some good sign of things moving towards the better. She is going to go back to Dr. dew for reevaluation to ensure her blood flow is still appropriate. That will be before her next evaluation with [place] next week. No fevers, chills, nausea, or vomiting noted at this time. Patient does have some discomfort rated to be a 3-4/10 depending on activity specifically cleansing the wound makes it worse. 10/05/2017 -- the patient was seen by Dr. Lucky Cowboy last week and noninvasive studies showed a normal right ABI with brisk AKEIA, PEROT. ([id number]) triphasic waveforms consistent with no arterial insufficiency including normal digital pressures. The duplex showed a patent distal right SFA stent and the proximal SFA was also normal. He was pleased with her test and thought she should have enough of perfusion for normal wound healing. He would see her back in 6 months time. 12/21/17 on evaluation today patient appears to be doing fairly well in  regard to her right lateral ankle wound. Unfortunately the main issue that she is expansion at this point is that she is having some issues with what appears to be some cellulitis in the right anterior shin. She has also been noting a little bit of uncomfortable feeling especially last night and her ankle area. I'm afraid that she made the developing a little bit of an infection. With that being said I think it is in the early stages. 12/28/17 on evaluation today patient's ankle appears to be doing excellent.  She's making good progress at this point the cellulitis seems to have improved after last week's evaluation. Overall she is having no significant discomfort which is excellent news. She does have an appointment with Dr. dew on March 29, 2018 for reevaluation in regard to the stent he placed. She seems to have excellent blood flow in the right lower extremity. 01/19/12 on evaluation today patient's wound appears to be doing very well. In fact she does not appear to require debridement at this point, there's no evidence of infection, and overall from the standpoint of the wound she seems to be doing very well. With that being said I believe that it may be time to switch to different dressing away from the [facility] Dressing she tells me she does have a lot going on her friend actually passed away yesterday and she's also having a lot of issues with her husband this obviously is weighing heavy on her as far as your thoughts and concerns today. 01/25/18 on evaluation today patient appears to be doing fairly well in regard to her right lateral malleolus. She has been tolerating the dressing changes without complication. Overall I feel like this is definitely showing signs of improvement as far as how the overall appearance of the wound is there's also evidence of epithelium start to migrate over the granulation tissue. In general I think that she is progressing nicely as far as the wound is concerned. The only concern she really has is whether or not we can switch to every other week visits in order to avoid having as many appointments as her daughters have a difficult time getting her to her appointments as well as the patient's husband to his he is not doing very well at this point. 02/22/18 on evaluation today patient's right lateral malleolus ulcer appears to be doing great. She has been tolerating the dressing changes without complication. Overall you making excellent progress at this time. Patient  is having no significant discomfort. 03/15/18 on evaluation today patient appears to be doing much more poorly in regard to her right lateral ankle ulcer at this point. Unfortunately since have last seen her her husband has passed just a few days ago is obviously weighed heavily on her her daughter also had surgery well she is with her today as usual. There does not appear to be any evidence of infection she does seem to have significant contusion/deep tissue injury to the right lateral malleolus which was not noted previous when I saw her last. It's hard to tell of exactly when this injury occurred although during the time she was spending the night in the hospital this may have been most likely. 03/22/18 on evaluation today patient appears to actually be doing very well in regard to her ulcer. She did unfortunately have a setback which was noted last week however the good news is we seem to be getting back on track and in fact the wound in the core did  still have some necrotic tissue which will be addressed at this point today but in general I'm seeing signs that things are on the up and up. She is glad to hear this obviously she's been somewhat concerned that due to the how her wound digressed more recently. 03/29/18 on evaluation today patient appears to be doing fairly well in regard to her right lower extremity lateral malleolus ulcer. She unfortunately does have a new area of pressure injury over the inferior portion where the wound has opened up a little bit larger secondary to the pressure she seems to be getting. She does tell me sometimes when she sleeps at night that it actually hurts and does seem to be pushing on the area little bit more unfortunately. There does not appear to be any evidence of infection which is good news. She has been tolerating the dressing changes without complication. She also did have some bruising in the left second and third toes due to the fact that she may  have bump this or injured it although she has neuropathy so she does not feel she did move recently that may have been where this came from. Nonetheless there does not appear to be any evidence of infection at this time. 04/12/18 on evaluation today patient's wound on the right lateral ankle actually appears to be doing a little bit better with a lot of necrotic docking tissue centrally loosening up in clearing away. However she does have the beginnings of a deep tissue injury on the left lateral malleolus likely due to the fact we've been trying offload the right as much as we have. I think she may benefit from an assistive soft device to help with offloading and it looks like they're looking at one of the doughnut conditions that wraps around the lower leg to offload which I think will definitely do a good job. With that being said I think we definitely need to address this issue on the left before it becomes a wound. Patient is not having significant pain. NNEKA, BLANDA ([id number]) 04/19/18 on evaluation today patient appears to be doing excellent in regard to the progress she's made with her right lateral ankle ulcer. The left ankle region which did show evidence of a deep tissue injury seems to be resolving there's little fluid noted underneath and a blister there's nothing open at this point in time overall I feel like this is progressing nicely which is good news. She does not seem to be having significant discomfort at this point which is also good news. 04/25/18-She is here in follow up evaluation for bilateral lateral malleolar ulcers. The right lateral malleolus ulcer with pale subcutaneous tissue exposure, central area of ulcer with tendon/periosteum exposed. The left lateral malleolus ulcer now with central area of nonviable tissue, otherwise deep tissue injury. She is wearing compression wraps to the left lower extremity, she will place the right lower extremity compression wraps on  when she gets home. She will be out of town over the weekend and return next week and follow-up appointment. She completed her doxycycline this morning 05/03/18 on evaluation today patient appears to be doing very well in regard to her right lateral ankle ulcer in general. At least she's showing some signs of improvement in this regard. Unfortunately she has some additional injury to the left lateral malleolus region which appears to be new likely even over the past several days. Again this determination is based on the overall appearance. With that being said the  patient is obviously frustrated about this currently. 05/10/18-She is here in follow-up evaluation for bilateral lateral malleolar ulcers. She states she has purchased offloading shoes/boots and they will arrive tomorrow. She was asked to bring them in the office at next week's appointment so her provider is aware of product being utilized. She continues to sleep on right or left side, she has been encouraged to sleep on her back. The right lateral malleolus ulcer is precariously close to peri-osteum; will order xray. The left lateral malleolus ulcer is improved. Will switch back to santyl; she will follow up next week. 05/17/18 on evaluation today patient actually appears to be doing very well in regard to her malleolus her ulcers compared to last time I saw them. She does not seem to have as much in the way of contusion at this point which is great news. With that being said she does continue to have discomfort and I do believe that she is still continuing to benefit from the offloading/pressure reducing boots that were recommended. I think this is the key to trying to get this to heal up completely. 05/24/18 on evaluation today patient actually appears to be doing worse at this point in time unfortunately compared to her last week's evaluation. She is having really no increased pain which is good news unfortunately she does have more  maceration in your theme and noted surrounding the right lateral ankle the left lateral ankle is not really is erythematous I do not see signs of the overt cellulitis on that side. Unfortunately the wounds do not seem to have shown any signs of improvement since the last evaluation. She also has significant swelling especially on the right compared to previous some of this may be due to infection however also think that she may be served better while she has these wounds by compression wrapping versus continuing to use the Juxta-Lite for the time being. Especially with the amount of drainage that she is experiencing at this point. No fevers, chills, nausea, or vomiting noted at this time. 05/31/18 on evaluation today patient appears to actually be doing better in regard to her right lateral lower extremity ulcer specifically on the malleolus region. She has been tolerating the antibiotic without complication. With that being said she still continues to have issues but a little bit of redness although nothing like she what she was experiencing previous. She still continues to pressure to her ankle area she did get the problem on offloading boots unfortunately she will not wear them she states there too uncomfortable and she can't get in and out of the bed. Nonetheless at this point her wounds seem to be continually getting worse which is not what we want I'm getting somewhat concerned about her progress and how things are going to proceed if we do not intervene in some way shape or form. I therefore had a very lengthy conversation today about offloading yet again and even made a specific suggestion for switching her to a memory foam mattress and even gave the information for a specific one that they could look at getting if it was something that they were interested in considering. She does not want to be considered for a hospital bed air mattress although honestly insurance would not cover it that she  does not have any wounds on her trunk. 06/14/18 on evaluation today both wounds over the bilateral lateral malleolus her ulcers appear to be doing better there's no evidence of pressure injury at this point. She did  get the foam mattress for her bed and this does seem to have been extremely beneficial for her in my pinion. Her daughter states that she is having difficulty getting out of bed because of how soft it is. The patient also relates this to be. Nonetheless I do feel like she's actually doing better. Unfortunately right after and around the time she was getting the mattress she also sustained a fall when she got up to go pick up the phone and ended up injuring her right elbow she has [address] sut[address] place. We are not caring for this currently although home health is going to be taking the sutures out shortly. Nonetheless this may be something that we need to evaluate going forward. It depends on how well it has or has not healed in the end. She also recently saw an orthopedic specialist for an injection in the right shoulder just before her fall unfortunately the fall seems to have worsened her pain. 06/21/18 on evaluation today patient appears to be doing about the same in regard to her lateral malleolus ulcers. Both appear to be just a little bit deeper but again we are clinging away the necrotic and dead tissue which I think is why this is progressing towards a deeper realm as opposed to improving from my measurement standpoint in that regard. Nonetheless she has been tolerating the dressing changes she absolutely hates the memory foam mattress topper that was obtained for her nonetheless Darenda, Fike [name] ([id number]) I do believe this is still doing excellent as far as taking care of excess pressure in regard to the lateral malleolus regions. She in fact has no pressure injury that I see whereas in weeks past it was week by week I was constantly seeing new pressure injuries. Overall I  think it has been very beneficial for her. 07/03/18; patient arrives in my clinic today. She has deep punched out areas over her bilateral lateral malleoli. The area on the right has some more depth. We spent a lot of time today talking about pressure relief for these areas. This started when her daughter asked for a prescription for a memory foam mattress. I have never written a prescription for a mattress and I don't think insurances would pay for that on an ordinary bed. In any case he came up that she has foam boots that she refuses to wear. I would suggest going to these before any other offloading issues when she is in bed. They say she is meticulous about offloading this the rest of the day 07/10/18- She is seen in follow-up evaluation for bilateral, lateral malleolus ulcers. There is no improvement in the ulcers. She has purchased and is sleeping on a memory foam mattress/overlay, she has been using the offloading boots nightly over the past week. She has a follow up appointment with vascular medicine at the end of October, in my opinion this follow up should be expedited given her deterioration and suboptimal TBI results. We will order plain film xray of the left ankle as deeper structures are palpable; would consider having MRI, regardless of xray report(s). The ulcers will be treated with iodoflex/iodosorb, she is unable to safely change the dressings daily with santyl. 07/19/18 on evaluation today patient appears to be doing in general visually well in regard to her bilateral lateral malleolus ulcers. She has been tolerating the dressing changes without complication which is good news. With that being said we did have an x-ray performed on 07/12/18 which revealed a slight  loosen see in the lateral portion of the distal left fibula which may represent artifact but underline lytic destruction or osteomyelitis could not be excluded. MRI was recommended. With that being said we can see about  getting the patient scheduled for an MRI to further evaluate this area. In fact we have that scheduled currently for August 20 19,019. 07/26/18 on evaluation today patient's wound on the right lateral ankle actually appears to be doing fairly well at this point in my pinion. She has made some good progress currently. With that being said unfortunately in regard to the left lateral ankle ulcer this seems to be a little bit more problematic at this time. In fact as I further evaluated the situation she actually had bone exposed which is the first time that's been the case in the bone appear to be necrotic. Currently I did review patient's note from Dr. Bunnie Domino office with [facility]. He stated that ABI was 1.26 on the right and 0.95 on the left with good waveforms. Her perfusion is stable not reduced from previous studies and her digital waveforms were pretty good particularly on the right. His conclusion upon review of the note was that there was not much she could do to improve her perfusion and he felt she was adequate for wound healing. His suggestion was that she continued to see [place] and consider a synthetic skin graft if there was no underlying infection. He plans to see her back in six months or as needed. 08/01/18 on evaluation today patient appears to be doing better in regard to her right lateral ankle ulcer. Her left lateral ankle ulcer is about the same she still has bone involvement in evidence of necrosis. There does not appear to be evidence of infection at this time On the right lateral lower extremity. I have started her on the Augmentin she picked this up and started this yesterday. This is to get her through until she sees infectious disease which is scheduled for 08/12/18. 08/06/18 on evaluation today patient appears to be doing rather well considering my discussion with patient's daughter at the end of last week. The area which was marked where she had erythema  seems to be improved and this is good news. With that being said overall the patient seems to be making good improvement when it comes to the overall appearance of the right lateral ankle ulcer although this has been slow she at least is coming around in this regard. Unfortunately in regard to the left lateral ankle ulcer this is osteomyelitis based on the pathology report as well is bone culture. Nonetheless we are still waiting CT scan. Unfortunately the MRI we originally ordered cannot be performed as the patient is a pacemaker which I had overlooked. Nonetheless we are working on the CT scan approval and scheduling as of now. She did go to the hospital over the weekend and was placed on IV Cefzo for a couple of days. Fortunately this seems to have improved the erythema quite significantly which is good news. There does not appear to be any evidence of worsening infection at this time. She did have some bleeding after the last debridement therefore I did not perform any sharp debridement in regard to left lateral ankle at this point. Patient has been approved for a snap vac for the right lateral ankle. 08/14/18; the patient with wounds over her bilateral lateral malleoli. The area on the right actually looks quite good. Been using a snap back  on this area. Healthy granulation and appears to be filling in. Unfortunately the area on the left is really problematic. 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 Also noted to have cellulitis. She saw Dr. Novella Olive of infectious disease today and was put on linezolid. We are able to verify this with her pharmacy. She is completed the Augmentin that she was already on. We've been using Iodoflex to this GLORIANNA, GOTT ([id number]) area 08/23/18 on evaluation today patient's wounds both actually appear to be doing better compared to my prior evaluations. Fortunately she showing  signs of good improvement in regard to the overall wound status especially where were using the snap vac on the right. In regard to left lateral malleolus the wound bed actually appears to be much cleaner than previously noted. I do not feel any phone directly probed during evaluation today and though there is tendon noted this does not appear to be necrotic it's actually fairly good as far as the overall appearance of the tendon is concerned. In general the wound bed actually appears to be doing significantly better than it was previous. Patient is currently in the care of [name] and I did review that note today. He actually has her on two weeks of linezolid and then following the patient will be on 1-2 months of Keflex. That is the plan currently. She has been on antibiotics therapy as prescribed by myself initially starting on July 30, 2018 and has been on that continuously up to this point. 08/30/18 on evaluation today patient actually appears to be doing much better in regard to her right lateral malleolus ulcer. She has been tolerating the dressing changes specifically the snap vac without complication although she did have some issues with the seal currently. Apparently there was some trouble with getting it to maintain over the past week past Sunday. Nonetheless overall the wound appears better in regard to the right lateral malleolus region. In regard to left lateral malleolus this actually show some signs of additional granulation although there still tendon noted in the base of the wound this appears to be healthy not necrotic in any way whatsoever. We are considering potentially using a snap vac for the left lateral malleolus as well the product wrap from [employer], [employer], was present in the clinic today we're going to see this patient I did have her come in with me after obtaining consent from the patient and her daughter in order to look at the wound and see if there's  any recommendation one way or another as to whether or not they felt the snapback could be beneficial for the left lateral malleolus region. But the conclusion was that it might be but that this is definitely a little bit deeper wound than what traditionally would be utilized for a snap vac. 09/06/18 on evaluation today patient actually appears to be doing excellent in my pinion in regard to both ankle ulcers. She has been tolerating the dressing changes without complication which is great news. Specifically we have been using the snap vac. In regard to the right ankle I'm not even sure that this is going to be necessary for today and following as the wound has filled in quite nicely. In regard to the left ankle I do believe that we're seeing excellent epithelialization from the edge as well as granulation in the central portion the tendon is still exposed but there's no evidence  of necrotic bone and in general I feel like the patient has made excellent progress even compared to last week with just one week of the snap vac. 09/11/18; this is a patient who has wounds on her bilateral lateral malleoli. Initially both of these were deep stage IV wounds in the setting of chronic arterial insufficiency. She has been revascularized. As I understand think she been using snap vacs to both of these wounds however the area on the right became more superficial and currently she is only using it on the left. Using silver collagen on the right and silver collagen under the back on the left I believe 09/19/18 on evaluation today patient actually appears to be doing very well in regard to her lateral malleolus or ulcers bilaterally. She has been tolerating the dressing changes without complication. Fortunately there does not appear to be any evidence of infection at this time. Overall I feel like she is improving in an excellent manner and I'm very pleased with the fact that everything seems to be turning towards  the better for her. This has obviously been a long road. 09/27/18 on evaluation today patient actually appears to be doing very well in regard to her bilateral lateral malleolus ulcers. She has been tolerating the dressing changes without complication. Fortunately there does not appear to be any evidence of infection at this time which is also great news. No fevers, chills, nausea, or vomiting noted at this time. Overall I feel like she is doing excellent with the snap vac on the left malleolus. She had 40 mL of fluid collection over the past week. 10/04/18 on evaluation today patient actually appears to be doing well in regard to her bilateral lateral malleolus ulcers. She continues to tolerate the dressing changes without complication. One issue that I see is the snap vac on the left lateral malleolus which appears to have sealed off some fluid underlying this area and has not really allowed it to heal to the degree that I would like to see. For that reason I did suggest at this point we may want to pack a small piece of packing strip into this region to allow it to more effectively wick out fluid. 10/11/18 in general the patient today does not feel that she has been doing very well. She's been a little bit lethargic and subsequently is having bodyaches as well according to what she tells me today. With that being said overall she has been concerned with the fact that something may be worsening although to be honest her wounds really have not been appearing poorly. She does have a new ulcer on her left heel unfortunately. This may be pressure related. Nonetheless it seems to me to have potentially started at least as a blister I do not see any evidence of deep tissue injury. In regard to the left ankle the snap vac still seems to be causing the ceiling off of the deeper part of the wound which is in turn trapping fluid. I'm not extremely pleased with the overall appearance as far as progress from  last week to this week therefore I'm gonna discontinue Cerda, [name] ([id number]) the snap vac at this point. 10/18/18 patient unfortunately this point has not been feeling well for the past several days. She was seen by Grayland Ormond her primary care provider who is a Librarian, academic at [facility]. Subsequently she states that she's been very weak and generally feeling malaise. No fevers, chills, nausea, or vomiting noted at  this time. With that being said bloodwork was performed at the PCP office on the 11th of this month which showed a white blood cell count of 10.7. This was repeated today and shows a white blood cell count of 12.4. This does show signs of worsening. Coupled with the fact that she is feeling worse and that her left ankle wound is not really showing signs of improvement I feel like this is an indication that the osteomyelitis is likely exacerbating not improving. Overall I think we may also want to check her C-reactive protein and sedimentation rate. Actually did call [facility] office this afternoon while the patient was in the office here with me. Subsequently based on the findings we discussed treatment possibilities and I think that it is appropriate for [place] to go ahead and initiate treatment with doxycycline which I'm going to do. Subsequently he did agree to see about adding a CRP and sedimentation rate to her orders. If that has not already been drawn to where they can run it they will contact the patient she can come back to have that check. They are in agreement with plan as far as the patient and her daughter are concerned. Nonetheless also think we need to get in touch with Dr. Henreitta Leber office to see about getting the patient scheduled with him as soon as possible. 11/08/18 on evaluation today patient presents for follow-up concerning her bilateral foot and ankle ulcers. I did do an extensive review of her chart in epic today. Subsequently she was  seen by [name] he did initiate Cefepime IV antibiotic therapy. Subsequently she had some issues with her PICC line this had to be removed because it was coiled and then replaced. Fortunately that was now settled. Unfortunately she has continued have issues with her left heel as well as the issues that she is experiencing with her bilateral lateral malleolus regions. I do believe however both areas seem to be doing a little bit better on evaluation today which is good news. No fevers, chills, nausea, or vomiting noted at this time. She actually has an angiogram schedule with Dr. dew on this coming Monday, November 11, 2018. Subsequently the patient states that she is feeling much better especially than what she was roughly 2 weeks ago. She actually had to cancel an appointment because she was feeling so poorly. No fevers, chills, nausea, or vomiting noted at this time. 11/15/18 on evaluation today patient actually is status post having had her angiogram with Dr. dew Monday, four days ago. 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 He had to go and work on several areas of the vasculature fortunately he was able to obtain no more than a 30% residual stenosis throughout post procedure. I reviewed this note today. I think this will definitely help with healing at this time. Fortunately there does not appear to be any signs of infection and I do feel like ratio already has a better appearance to it. 11/22/18 upon evaluation today patient actually appears to be doing very well in regard to her wounds in general. The right lateral malleolus looks excellent the heel looks better in the left lateral malleolus also appears to be doing a little better. With that being said the right second toe actually appears to be open and training we been watching this is been dry and stable but now is open. 12/03/2018 Seen today for follow-up and management of multiple bilateral lower extremity  wounds.  New pressure injury of the great toe which is closed at this time. Wound of the right distal second toe appears larger today with deep undermining and a pocket of fluid present within the undermining region. Left and right malleolus is wounds are stable today with no signs and symptoms of infection.Denies any needs or concerns during exam today. 12/13/18 on evaluation today patient appears to be doing somewhat better in regard to her left heel ulcer. She also seems to be completely healed in regard to the right lateral malleolus ulcer. The left malleolus ulcer is smaller what unfortunately the wounds which are new over the first and second toes of the right foot are what are most concerning at this point especially the second. Both areas did require sharp debridement today. 12/20/18 on evaluation today patient's wound actually appears to be doing better in regard to left lateral ankle and her right lateral ankle continues to remain healed. The hill ulcer on the left is improved. She does have improvement noted as well in regard to both toe ulcers. Overall I'm very pleased in this regard. No fevers, chills, nausea, or vomiting noted at this time. Patient History Information obtained from Patient. Family History Cancer - Father,Siblings, Heart Disease - Siblings, No family history of Diabetes, Hereditary Spherocytosis, Hypertension, Kidney Disease, Lung Disease, Seizures, Stroke, Thyroid Problems, Tuberculosis. DAZHANE, VILLAGOMEZ ([id number]) Social History Never smoker, Marital Status - Married, Alcohol Use - Never, Drug Use - No History, Caffeine Use - Rarely. Review of Systems (ROS) Constitutional Symptoms (General Health) Denies complaints or symptoms of Fever, Chills. Respiratory The patient has no complaints or symptoms. Cardiovascular The patient has no complaints or symptoms. Psychiatric The patient has no complaints or symptoms. Objective Constitutional Well-nourished and  well-hydrated in no acute distress. Vitals Time Taken: 9:33 AM, Height: 65 in, Weight: 154.3 lbs, BMI: 25.7, Temperature: 98.4 F, Pulse: 85 bpm, Respiratory Rate: 18 breaths/min, Blood Pressure: 115/80 mmHg. Respiratory normal breathing without difficulty. clear to auscultation bilaterally. Cardiovascular regular rate and rhythm with normal S1, S2. Psychiatric this patient is able to make decisions and demonstrates good insight into disease process. Alert and Oriented x 3. pleasant and cooperative. General Notes: Patient's right first toe did require sharp debridement today which was performed without complication post debridement the wound. Appears to be doing much better. Overall I'm very pleased though I did have to use some silver nitrate to help with the hyper granular tissue at this point. Integumentary (Hair, Skin) Wound #2 status is Open. Original cause of wound was Gradually Appeared. The wound is located on the Left,Lateral Malleolus. The wound measures 0.8cm length x 0.7cm width x 0.5cm depth; 0.44cm^2 area and 0.22cm^3 volume. There is Fat Layer (Subcutaneous Tissue) Exposed exposed. There is no tunneling or undermining noted. There is a medium amount of serous drainage noted. The wound margin is epibole. There is no granulation within the wound bed. There is a small (1-33%) amount of necrotic tissue within the wound bed including Adherent Slough. The periwound skin appearance exhibited: Maceration, Hemosiderin Staining. The periwound skin appearance did not exhibit: Callus, Crepitus, Excoriation, Induration, Rash, Scarring, Dry/Scaly, Atrophie Blanche, Cyanosis, Ecchymosis, Mottled, Pallor, Rubor, Erythema. Periwound temperature was noted as No Abnormality. The periwound has tenderness on palpation. Wound #3 status is Open. Original cause of wound was Gradually Appeared. The wound is located on the Left Calcaneus. The wound measures 1.5cm length x 1.7cm width x 0.1cm depth;  2.003cm^2 area and 0.2cm^3 volume. There is Fat Layer (Subcutaneous Tissue) Exposed  exposed. There is no tunneling or undermining noted. There is a small amount of serous Steffenhagen, MADELEYN SCHWIMMER. ([id number]) drainage noted. The wound margin is indistinct and nonvisible. There is small (1-33%) hyper - granulation within the wound bed. There is a large (67-100%) amount of necrotic tissue within the wound bed including Eschar and Adherent Slough. The periwound skin appearance exhibited: Maceration. The periwound skin appearance did not exhibit: Callus, Crepitus, Excoriation, Induration, Rash, Scarring, Dry/Scaly, Atrophie Blanche, Cyanosis, Ecchymosis, Hemosiderin Staining, Mottled, Pallor, Rubor, Erythema. Periwound temperature was noted as No Abnormality. The periwound has tenderness on palpation. Wound #4 status is Open. Original cause of wound was Pressure Injury. The wound is located on the Right,Distal Toe Second. The wound measures 0.2cm length x 0.3cm width x 0.4cm depth; 0.047cm^2 area and 0.019cm^3 volume. There is Fat Layer (Subcutaneous Tissue) Exposed exposed. There is no tunneling or undermining noted. There is a small amount of serous drainage noted. The wound margin is distinct with the outline attached to the wound base. There is no granulation within the wound bed. There is no necrotic tissue within the wound bed. The periwound skin appearance exhibited: Callus, Dry/Scaly. The periwound skin appearance did not exhibit: Crepitus, Excoriation, Induration, Rash, Scarring, Maceration, Atrophie Blanche, Cyanosis, Ecchymosis, Hemosiderin Staining, Mottled, Pallor, Rubor, Erythema. Periwound temperature was noted as No Abnormality. The periwound has tenderness on palpation. Wound #5 status is Open. Original cause of wound was Footwear Injury. The wound is located on the Right,Distal [employer] The wound measures 0.4cm length x 0.4cm width x 0.1cm depth; 0.126cm^2 area and 0.013cm^3 volume. There  is Fat Layer (Subcutaneous Tissue) Exposed exposed. There is no tunneling or undermining noted. There is a small amount of serous drainage noted. The wound margin is flat and intact. There is no granulation within the wound bed. There is a small (1-33%) amount of necrotic tissue within the wound bed including Eschar and Adherent Slough. The periwound skin appearance exhibited: Callus, Dry/Scaly. The periwound skin appearance did not exhibit: Crepitus, Excoriation, Induration, Rash, Scarring, Maceration, Atrophie Blanche, Cyanosis, Ecchymosis, Hemosiderin Staining, Mottled, Pallor, Rubor, Erythema. Periwound temperature was noted as No Abnormality. 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 There was a Excisional Skin/Subcutaneous Tissue Debridement with a total area of 2.55 sq cm performed by STONE III, Karolyne Timmons E., PA-C. With the following instrument(s): Curette to remove Viable and Non-Viable tissue/material. Material removed includes Subcutaneous Tissue, Slough, and Biofilm after achieving pain control using Lidocaine 4% Topical Solution. No specimens were taken. A time out was conducted at 10:25, prior to the start of the procedure. A Minimum amount of bleeding was controlled with Pressure. The procedure was tolerated well with a pain level of 0 throughout and a pain level of 0 following the procedure. Post Debridement Measurements: 1.5cm length x 1.7cm width x 0.2cm depth; 0.401cm^3 volume. Post  debridement Stage noted as Category/Stage II. Luczak, [name] ([id number]) Character of Wound/Ulcer Post Debridement is improved. Post procedure Diagnosis Wound #3: Same as Pre-Procedure Wound #5 Pre-procedure diagnosis of Wound #5 is a Pressure Ulcer  located on the Distal Toe Great . There was a Excisional Skin/Subcutaneous Tissue Debridement with a total area of 0.16 sq cm performed by STONE III, Talon Witting E., PA-C. With the following instrument(s): Curette to remove Viable and Non-Viable tissue/material. Material removed includes Subcutaneous Tissue and Slough and after achieving pain control using Lidocaine 4% Topical Solution. No specimens were taken. A time out was conducted at 10:18, prior to the start of the procedure. A Moderate amount of bleeding was controlled with Silver Nitrate. The procedure was tolerated well with a pain level of 0 throughout and a pain level of 0 following the procedure. Post Debridement Measurements: 0.4cm length x 0.4cm width x 0.2cm depth; 0.025cm^3 volume. Post debridement Stage noted as Category/Stage II. Character of Wound/Ulcer Post Debridement is improved. Post procedure Diagnosis Wound #5: Same as Pre-Procedure Plan Wound Cleansing: Wound #2 Left,Lateral Malleolus: Clean wound with Normal Saline. Cleanse wound with mild soap and water May Shower, gently pat wound dry prior to applying new dressing. Wound #3 Left Calcaneus: Clean wound with Normal Saline. Cleanse wound with mild soap and water May Shower, gently pat wound dry prior to applying new dressing. Wound #4 Distal Toe Second: Clean wound with Normal Saline. Cleanse wound with mild soap and water May Shower, gently pat wound dry prior to applying new dressing. Wound #5 Distal Toe Great: Clean wound with Normal Saline. Cleanse wound with mild soap and water May Shower, gently pat wound dry prior to applying new dressing. Anesthetic (add to Medication List): Wound #2 Left,Lateral  Malleolus: Topical Lidocaine 4% cream applied to wound bed prior to debridement (In Clinic Only). Wound #3 Left Calcaneus: Topical Lidocaine 4% cream applied to wound bed prior to debridement (In Clinic Only). Wound #4 Distal Toe Second: Topical Lidocaine 4% cream applied to wound bed prior to debridement (In Clinic Only). Wound #5 Distal Toe Great: Topical Lidocaine 4% cream applied to wound bed prior to debridement (In Clinic Only). Primary Wound Dressing: Wound #2 Left,Lateral Malleolus: Other: - SNAP VAC with prisma to tunnel Wound #3 Left Calcaneus: Silver Collagen Wound #4 Distal Toe Second: Silver Collagen Wound #5 Distal Toe Great: Silver Collagen Secondary Dressing: SHATIMA, ZALAR ([id number]) Wound #3 Left Calcaneus: Boardered Foam Dressing - Please keep a bordered foam dressing on the right malleolus with bactroban Wound #4 Distal Toe Second: Conform/Kerlix Wound #5 Distal Toe Great: Conform/Kerlix Dressing Change Frequency: Wound #2 Left,Lateral Malleolus: Change Dressing Monday, Wednesday, Friday Wound #3 Left Calcaneus: Change Dressing Monday, Wednesday, Friday Wound #4 Distal Toe Second: Change Dressing Monday, Wednesday, Friday Wound #5 Distal Toe Great: Change Dressing Monday, Wednesday, Friday Follow-up Appointments: Wound #2 Left,Lateral Malleolus: Return Appointment in 1 week. Wound #3 Left Calcaneus: Return Appointment in 1 week. Wound #4 Distal Toe Second: Return Appointment in 1 week. Wound #5 Distal Toe Great: Return Appointment in 1 week. Edema Control: Wound #2 Left,Lateral Malleolus: Patient to wear own compression stockings Patient to wear own Velcro compression garment. Elevate legs to the level of the heart and pump ankles as often as possible Wound #3 Left Calcaneus: Patient to wear own compression stockings Patient to wear own Velcro compression garment. Elevate legs to the level of the heart and pump ankles as often as  possible Wound #4 Distal Toe Second: Patient to wear own compression stockings Patient to wear own Velcro compression garment. Elevate legs to the level of the heart and pump ankles as often as possible Wound #5 Distal Toe Great: Patient to wear own compression stockings Patient  to wear own Velcro compression garment. Elevate legs to the level of the heart and pump ankles as often as possible Off-Loading: Wound #2 Left,Lateral Malleolus: Turn and reposition every 2 hours Other: - do not put pressure on your ankles Wound #3 Left Calcaneus: Turn and reposition every 2 hours Other: - do not put pressure on your ankles Wound #4 Distal Toe Second: Turn and reposition every 2 hours Other: - do not put pressure on your ankles Wound #5 Distal Toe Great: Turn and reposition every 2 hours Other: - do not put pressure on your ankles Additional Orders / Instructions: Wound #2 Left,Lateral Malleolus: Increase protein intake. Increase protein intake. Wound #3 Left Calcaneus: Increase protein intake. EMERA, BUSSIE ([id number]) Increase protein intake. Wound #4 Distal Toe Second: Increase protein intake. Increase protein intake. Wound #5 Distal Toe Great: Increase protein intake. Increase protein intake. Home Health: Wound #3 Left Calcaneus: Continue Home Health Visits - [facility]. Please visit patient Monday, Wednesday, and Friday Home Health Nurse may visit PRN to address patient s wound care needs. FACE TO FACE ENCOUNTER: MEDICARE and MEDICAID PATIENTS: I certify that this patient is under my care and that I had a face-to-face encounter that meets the physician face-to-face encounter requirements with this patient on this date. The encounter with the patient was in whole or in part for the following MEDICAL CONDITION: (primary reason for [place]) MEDICAL NECESSITY: I certify, that based on my findings, NURSING services are a medically necessary home health service. HOME  BOUND STATUS: I certify that my clinical findings support that this patient is homebound (i.e., Due to illness or injury, pt requires aid of supportive devices such as crutches, cane, wheelchairs, walkers, the use of special transportation or the assistance of another person to leave their place of residence. There is a normal inability to leave the home and doing so requires considerable and taxing effort. Other absences are for medical reasons / religious services and are infrequent or of short duration when for other reasons). If current dressing causes regression in wound condition, may D/C ordered dressing product/s and apply Normal Saline Moist Dressing daily until next [place] / Other MD appointment. [place] of regression in wound condition at [phone]. Please direct any NON-WOUND related issues/requests for orders to patient's Primary Care Physician Wound #4 Distal Toe Second: [place] [facility] - [facility]. Please visit patient Monday, Wednesday, and Friday Home Health Nurse may visit PRN to address patient s wound care needs. FACE TO FACE ENCOUNTER: MEDICARE and MEDICAID PATIENTS: I certify that this patient is under my care and that I had a face-to-face encounter that meets the physician face-to-face encounter requirements with this patient on this date. The encounter with the patient was in whole or in part for the following MEDICAL CONDITION: (primary reason for [place]) MEDICAL NECESSITY: I certify, that based on my findings, NURSING services are a medically necessary home health service. HOME BOUND STATUS: I certify that my clinical findings support that this patient is homebound (i.e., Due to illness or injury, pt requires aid of supportive devices such as crutches, cane, wheelchairs, walkers, the use of special transportation or the assistance of another person to leave their place of residence. There is a normal inability to  leave the home and doing so requires considerable and taxing effort. Other absences are for medical reasons / religious services and are infrequent or of short duration when for other reasons). If current dressing causes regression in  wound condition, may D/C ordered dressing product/s and apply Normal Saline Moist Dressing daily until next [place] / Other MD appointment. [place] of regression in wound condition at [phone]. Please direct any NON-WOUND related issues/requests for orders to patient's Primary Care Physician Wound #5 Distal Toe Great: [place] [facility]. Please visit patient Monday, Wednesday, and Friday Home Health Nurse may visit PRN to address patient s wound care needs. FACE TO FACE ENCOUNTER: MEDICARE and MEDICAID PATIENTS: I certify that this patient is under my care and that I had a face-to-face encounter that meets the physician face-to-face encounter requirements with this patient on this date. The encounter with the patient was in whole or in part for the following MEDICAL CONDITION: (primary reason for [place]) MEDICAL NECESSITY: I certify, that based on my findings, NURSING services are a medically necessary home health service. HOME BOUND STATUS: I certify that my clinical findings support that this patient is homebound (i.e., Due to illness or injury, pt requires aid of supportive devices such as crutches, cane, wheelchairs, walkers, the use of special transportation or the assistance of another person to leave their place of residence. There is a normal inability to leave the home and doing so requires considerable and taxing effort. Other absences are for medical reasons / religious services and are infrequent or of short duration when for other reasons). If current dressing causes regression in wound condition, may D/C ordered dressing product/s and apply Normal Saline Moist Dressing daily until next  [place] / Other MD appointment. [place] of regression in wound condition at [phone]. Please direct any NON-WOUND related issues/requests for orders to patient's Primary Care Physician Kerilyn, Cortner [name] ([id number]) My suggestion is gonna be that we continue with the above wound care measures for the next week. The patient is in agreement with plan. We will subsequently see were things stand at follow-up. Please see above for specific wound care orders. We will see patient for re-evaluation in 1 week(s) here in the clinic. If anything worsens or changes patient will contact our office for additional recommendations. Electronic Signature(s) Signed: 12/26/2018 9:23:56 AM By: Worthy Keeler PA-C Entered By: Worthy Keeler on 12/26/2018 08:58:00 Mathisen, [name] ([id number]) -------------------------------------------------------------------------------- ROS/PFSH Details Patient Name: [name] Date of Service: 12/20/2018 9:30 AM Medical Record Number: [id number] Patient Account Number: [id number] Date of Birth/Sex: 03-Feb-1923 (85 y.o. F) Treating RN: Montey Hora Primary Care Provider: Grayland Ormond Other Clinician: Referring Provider: Grayland Ormond Treating Provider/Extender: Melburn Hake, Lyfe Reihl Weeks in Treatment: 44 Information Obtained From Patient Wound History Do you currently have one or more open woundso Yes How many open wounds do you currently haveo 1 Approximately how long have you had your woundso 2 yrs How have you been treating your wound(s) until nowo mupirocin, soaking in epsom salt Has your wound(s) ever healed and then re-openedo No Have you had any lab work done in the past montho No Have you tested positive for an antibiotic resistant organism (MRSA, VRE)o No Have you tested positive for osteomyelitis (bone infection)o No Have you had any tests for circulation on your legso Yes Who ordered the testo Dr. Lucky Cowboy Where  [facility] the test [facility] Constitutional Symptoms (General Health) Complaints and Symptoms: Negative for: Fever; Chills Eyes Medical History: Positive for: Cataracts - surgery Respiratory Complaints and Symptoms: No Complaints or Symptoms Cardiovascular Complaints and Symptoms: No Complaints or Symptoms Medical History: Positive for: Congestive Heart Failure;  Hypertension Musculoskeletal Medical History: Positive for: Osteoarthritis Neurologic Medical History: Positive for: Neuropathy NARELY, NOBLES ([id number]) Oncologic Medical History: Negative for: Received Chemotherapy; Received Radiation Psychiatric Complaints and Symptoms: No Complaints or Symptoms HBO Extended History Items Eyes: Cataracts Immunizations Pneumococcal Vaccine: Received Pneumococcal Vaccination: Yes Implantable Devices Family and Social History Cancer: Yes - Father,Siblings; Diabetes: No; Heart Disease: Yes - Siblings; Hereditary Spherocytosis: No; Hypertension: No; Kidney Disease: No; Lung Disease: No; Seizures: No; Stroke: No; Thyroid Problems: No; Tuberculosis: No; Never smoker; Marital Status - Married; Alcohol Use: Never; Drug Use: No History; Caffeine Use: Rarely; Financial Concerns: No; Food, Clothing or Shelter Needs: No; Support System Lacking: No; Transportation Concerns: No; Advanced Directives: No; Patient does not want information on Advanced Directives; Do not resuscitate: No; Living Will: Yes (Not Provided); Medical Power of Attorney: No Physician Affirmation I have reviewed and agree with the above information. Electronic Signature(s) Signed: 12/26/2018 9:23:56 AM By: Worthy Keeler PA-C Signed: 12/26/2018 5:09:04 PM By: Montey Hora Entered By: Worthy Keeler on 12/26/2018 08:57:33 Delossantos, [name] ([id number]) -------------------------------------------------------------------------------- SuperBill Details Patient Name: [name] Date of Service:  12/20/2018 Medical Record Number: [id number] Patient Account Number: [id number] Date of Birth/Sex: June 25, 1923 (85 y.o. 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 I70.243 Atherosclerosis of native arteries of left leg with ulceration of ankle I89.0 Lymphedema, not elsewhere classified B35.4 Tinea corporis 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

## 2018-12-28 NOTE — Progress Notes (Signed)
[name], [name] ([id number]) Visit Report for 12/27/2018 Arrival Information Details Patient Name: [name], [name]. Date of Service: 12/27/2018 11:00 AM Medical Record Number: [id number] Patient Account Number: [id number] Date of Birth/Sex: Oct 03, 1923 (85 y.o. F) Treating RN: Secundino Ginger Primary Care Denyce Harr: Grayland Ormond Other Clinician: Referring Florella Mcneese: Grayland Ormond Treating Hoang Pettingill/Extender: Melburn Hake, HOYT Weeks in Treatment: 5 Visit Information History Since Last Visit Added or deleted any medications: No Patient Arrived: Ambulatory Any new allergies or adverse reactions: No Arrival Time: 11:13 Had a fall or experienced change in No Accompanied By: daughter activities of daily living that may affect Transfer Assistance: None risk of falls: Patient Identification Verified: Yes Signs or symptoms of abuse/neglect since last visito No Secondary Verification Process Yes Hospitalized since last visit: No Completed: Implantable device outside of the clinic excluding No Patient Requires Transmission-Based No cellular tissue based products placed in the center Precautions: since last visit: Patient Has Alerts: Yes Has Dressing in Place as Prescribed: Yes Patient Alerts: ABI AVVS 03/29/18 L Pain Present Now: No 1.05 R 1.03, TBI L .56, R .85 ABI AVVS 07/23/18 L .95 R 1.26 Electronic Signature(s) Signed: 12/27/2018 3:23:26 PM By: Secundino Ginger Entered By: Secundino Ginger on 12/27/2018 11:14:27 [name], [name] ([id number]) -------------------------------------------------------------------------------- Encounter Discharge Information Details Patient Name: [name] Date of Service: 12/27/2018 11:00 AM Medical Record Number: [id number] Patient Account Number: [id number] Date of Birth/Sex: [birth year] (85 y.o. F) Treating RN: Montey Hora Primary Care Eion Timbrook: Grayland Ormond Other Clinician: Referring Judas Mohammad: Grayland Ormond Treating Keslie Gritz/Extender: Melburn Hake, HOYT Weeks in Treatment: 61 Encounter Discharge Information Items Post Procedure Vitals Discharge Condition: Stable Temperature (F): 98.1 Ambulatory Status: Walker Pulse (bpm): 89 Discharge Destination: Home Respiratory Rate (breaths/min): 18 Transportation: Private Auto Blood Pressure (mmHg): 116/52 Accompanied By: dtr Schedule Follow-up Appointment: Yes Clinical Summary of Care: Electronic Signature(s) Signed: 12/27/2018 2:38:17 PM By: Montey Hora Entered By: Montey Hora on 12/27/2018 14:38:17 Severns, [name] ([id number]) -------------------------------------------------------------------------------- Lower Extremity Assessment Details Patient Name: [name] Date of Service: 12/27/2018 11:00 AM Medical Record Number: [id number] Patient Account Number: [id number] Date of Birth/Sex: 1923/01/13 (85 y.o. F) Treating RN: Secundino Ginger Primary Care Tarin Johndrow: Grayland Ormond Other Clinician: Referring [name]: Grayland Ormond Treating Nikolos Billig/Extender: Melburn Hake, HOYT Weeks in Treatment: 72 Edema Assessment Assessed: [Left: No] [Right: No] [Left: Edema] [Right: :] Calf Left: Right: Point of Measurement: 22 cm From Medial Instep 27 cm 21 cm Ankle Left: Right: Point of Measurement: 12 cm From Medial Instep 21 cm 18.5 cm Vascular Assessment Claudication: Claudication Assessment [Left:None] [Right:None] Pulses: Dorsalis Pedis Palpable: [Right:Yes] Posterior Tibial Extremity colors, hair growth, and conditions: Extremity Color: [Left:Hyperpigmented] [Right:Hyperpigmented] Hair Growth on Extremity: [Left:No] [Right:No] Temperature of Extremity: [Left:Warm] [Right:Warm] Capillary Refill: [Left:< 3 seconds] [Right:< 3 seconds] Toe Nail Assessment Left: Right: Thick: Yes Yes Discolored: No No Deformed: No No Improper Length and Hygiene: No No Electronic Signature(s) Signed: 12/27/2018 3:23:26 PM By: Secundino Ginger Entered By: Secundino Ginger on 12/27/2018  11:37:38 [name], [name] ([id number]) -------------------------------------------------------------------------------- Multi Wound Chart Details Patient Name: [name] Date of Service: 12/27/2018 11:00 AM Medical Record Number: [id number] Patient Account Number: [id number] Date of Birth/Sex: [birth year] (85 y.o. F) Treating RN: Montey Hora Primary Care Kevis Qu: Grayland Ormond Other Clinician: Referring Aadarsh Cozort: Grayland Ormond Treating Abdias Hickam/Extender: STONE III, HOYT Weeks in Treatment: 72 Vital Signs Height(in): 65 Pulse(bpm): 89 Weight(lbs): 154.3 Blood Pressure(mmHg): 116/52 Body Mass Index(BMI): 26 Temperature(F): 98.1 Respiratory Rate 18 (breaths/min): Photos: [2:No Photos] [3:No Photos] [4:No Photos] Wound Location: [2:Left Malleolus -  Lateral] [3:Left Calcaneus] [4:Right Toe Second - Distal] Wounding Event: [2:Gradually Appeared] [3:Gradually Appeared] [4:Pressure Injury] 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] [3:Pressure Ulcer] [4:Pressure Ulcer] 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] [3:N/A] [4:N/A] Comorbid History: [2:Cataracts, Congestive Heart Failure, Hypertension, Osteoarthritis, Neuropathy] [3:Cataracts, Congestive Heart Failure, Hypertension, Osteoarthritis, Neuropathy] [4:Cataracts, Congestive Heart Failure, Hypertension, Osteoarthritis,  Neuropathy] Date Acquired: [2:04/12/2018] [3:10/04/2018] [4:10/23/2018] Weeks of Treatment: [2:36] [3:11] [4:5] Wound Status: [2:Open] [3:Open] [4:Open] Measurements L x W x D [2:0.8x0.7x0.4] [3:1.4x1.6x0.1] [4:0.1x0.1x0.1] (cm) Area (cm) : [2:0.44] [3:1.759] [4:0.008] Volume (cm) : [2:0.176] [3:0.176] [4:0.001] % Reduction in Area: [2:43.90%] [3:36.00%] [4:88.70%] % Reduction in Volume: [2:-122.80%] [3:68.00%] [4:85.70%] Classification: [2:Category/Stage IV] [3:Category/Stage II] [4:Category/Stage II] Exudate Amount: [2:Medium] [3:Small] [4:None Present] Exudate Type: [2:Serous]  [3:Serous] [4:N/A] Exudate Color: [2:amber] [3:amber] [4:N/A] Wound Margin: [2:Epibole] [3:Indistinct, nonvisible] [4:N/A] Granulation Amount: [2:Large (67-100%)] [3:Small (1-33%)] [4:None Present (0%)] Granulation Quality: [2:Pink] [3:Hyper-granulation] [4:N/A] Necrotic Amount: [2:Small (1-33%)] [3:None Present (0%)] [4:Large (67-100%)] Necrotic Tissue: [2:Eschar] [3:N/A] [4:Eschar] Exposed Structures: [2:Fat Layer (Subcutaneous Tissue) Exposed: Yes Fascia: No Tendon: No Muscle: No Joint: No Bone: No] [3:Fat Layer (Subcutaneous Tissue) Exposed: Yes Fascia: No Tendon: No Muscle: No Joint: No Bone: No] [4:Fat Layer (Subcutaneous Tissue) Exposed: Yes  Fascia: No Tendon: No Muscle: No Joint: No Bone: No] Epithelialization: [2:None] [3:None] [4:N/A] Periwound Skin Texture: [2:Excoriation: No Induration: No Callus: No] [3:Excoriation: No Induration: No Callus: No] [4:Excoriation: No Induration: No Callus: No] Crepitus: No Crepitus: No Crepitus: No Rash: No Rash: No Rash: No Scarring: No Scarring: No Scarring: No Periwound Skin Moisture: Maceration: Yes Maceration: Yes Maceration: No Dry/Scaly: No Dry/Scaly: No Dry/Scaly: No Periwound Skin Color: Hemosiderin Staining: Yes Atrophie Blanche: No Atrophie Blanche: No Atrophie Blanche: No Cyanosis: No Cyanosis: No Cyanosis: No Ecchymosis: No Ecchymosis: No Ecchymosis: No Erythema: No Erythema: No Erythema: No Hemosiderin Staining: No Hemosiderin Staining: No Mottled: No Mottled: No Mottled: No Pallor: No Pallor: No Pallor: No Rubor: No Rubor: No Rubor: No Temperature: No Abnormality No Abnormality N/A Tenderness on Palpation: Yes Yes No Wound Preparation: Ulcer Cleansing: Ulcer Cleansing: Ulcer Cleansing: Rinsed/Irrigated with Saline Rinsed/Irrigated with Saline Rinsed/Irrigated with Saline Topical Anesthetic Applied: Topical Anesthetic Applied: Topical Anesthetic Applied: Other: lidocaine 4% Other: lidocaine  4% Other: lidocaine 4% Wound Number: 5 N/A N/A Photos: No Photos N/A N/A Wound Location: Right Toe Great - Distal N/A N/A Wounding Event: Footwear Injury N/A N/A Primary Etiology: Pressure Ulcer N/A N/A Secondary Etiology: N/A N/A N/A Comorbid History: Cataracts, Congestive Heart N/A N/A Failure, Hypertension, Osteoarthritis, Neuropathy Date Acquired: 12/03/2018 N/A N/A Weeks of Treatment: 3 N/A N/A Wound Status: Open N/A N/A Measurements L x W x D 0.4x0.4x0.1 N/A N/A (cm) Area (cm) : 0.126 N/A N/A Volume (cm) : 0.013 N/A N/A % Reduction in Area: 64.30% N/A N/A % Reduction in Volume: 62.90% N/A N/A Classification: Category/Stage II N/A N/A Exudate Amount: None Present N/A N/A Exudate Type: N/A N/A N/A Exudate Color: N/A N/A N/A Wound Margin: N/A N/A N/A Granulation Amount: None Present (0%) N/A N/A Granulation Quality: N/A N/A N/A Necrotic Amount: Small (1-33%) N/A N/A Necrotic Tissue: Adherent Slough N/A N/A Exposed Structures: Fat Layer (Subcutaneous N/A N/A Tissue) Exposed: Yes Fascia: No Tendon: No Muscle: No Joint: No Bone: No Epithelialization: N/A N/A N/A Periwound Skin Texture: No Abnormalities Noted N/A N/A [name], [name] ([id number]) Periwound Skin Moisture: No Abnormalities Noted N/A N/A Periwound Skin Color: No Abnormalities Noted N/A N/A Temperature: N/A N/A N/A Tenderness on Palpation: No N/A N/A Wound Preparation: Ulcer Cleansing: N/A N/A Rinsed/Irrigated with Saline Topical  Anesthetic Applied: Other: lidocaine 4% Treatment Notes Electronic Signature(s) Signed: 12/27/2018 3:43:47 PM By: Montey Hora Entered By: Montey Hora on 12/27/2018 11:47:02 [name], [name] ([id number]) -------------------------------------------------------------------------------- Protivin Details Patient Name: [name] Date of Service: 12/27/2018 11:00 AM Medical Record Number: [id number] Patient Account Number: [id number] Date  of Birth/Sex: 1923-03-21 (85 y.o. F) Treating RN: Montey Hora Primary Care Luvinia Lucy: Grayland Ormond Other Clinician: Referring Kaelen Caughlin: Grayland Ormond Treating Astria Jordahl/Extender: Melburn Hake, HOYT Weeks in Treatment: 68 Active Inactive Abuse / Safety / Falls / Self Care Management Nursing Diagnoses: Potential for falls Goals: Patient will not experience any injury related to falls Date Initiated: 08/10/2017 Target Resolution Date: 11/10/2017 Goal Status: Active Interventions: Assess Activities of Daily Living upon admission and as needed Assess fall risk on admission and as needed Assess: immobility, friction, shearing, incontinence upon admission and as needed Notes: Nutrition Nursing Diagnoses: Imbalanced nutrition Potential for alteratiion in Nutrition/Potential for imbalanced nutrition Goals: Patient/caregiver agrees to and verbalizes understanding of need to use nutritional supplements and/or vitamins as prescribed Date Initiated: 08/10/2017 Target Resolution Date: 12/08/2017 Goal Status: Active Interventions: Assess patient nutrition upon admission and as needed per policy Notes: Orientation to the Wound Care Program Nursing Diagnoses: Knowledge deficit related to the wound healing center program Goals: Patient/caregiver will verbalize understanding of the [facility] Date Initiated: 08/10/2017 Target Resolution Date: 09/08/2017 Goal Status: Active KEIASHA, DIEP ([id number]) Interventions: Provide education on orientation to the wound center Notes: Pain, Acute or Chronic Nursing Diagnoses: Pain, acute or chronic: actual or potential Potential alteration in comfort, pain Goals: Patient/caregiver will verbalize adequate pain control between visits Date Initiated: 08/10/2017 Target Resolution Date: 12/08/2017 Goal Status: Active Interventions: Complete pain assessment as per visit requirements Notes: Wound/Skin Impairment Nursing  Diagnoses: Impaired tissue integrity Knowledge deficit related to ulceration/compromised skin integrity Goals: Ulcer/skin breakdown will have a volume reduction of 80% by week 12 Date Initiated: 08/10/2017 Target Resolution Date: 12/01/2017 Goal Status: Active Interventions: Assess patient/caregiver ability to perform ulcer/skin care regimen upon admission and as needed Notes: Electronic Signature(s) Signed: 12/27/2018 3:43:47 PM By: Montey Hora Entered By: Montey Hora on 12/27/2018 11:46:51 [name], [name] ([id number]) -------------------------------------------------------------------------------- Pain Assessment Details Patient Name: [name] Date of Service: 12/27/2018 11:00 AM Medical Record Number: [id number] Patient Account Number: [id number] Date of Birth/Sex: Aug 28, 1923 (85 y.o. F) Treating RN: Secundino Ginger Primary Care Dawsen Krieger: Grayland Ormond Other Clinician: Referring Margarita Croke: Grayland Ormond Treating Chirstine Defrain/Extender: Melburn Hake, HOYT Weeks in Treatment: 72 Active Problems Location of Pain Severity and Description of Pain Patient Has Paino No Site Locations Pain Management and Medication Current Pain Management: Notes pt denies any pain at this time. Electronic Signature(s) Signed: 12/27/2018 3:23:26 PM By: Secundino Ginger Entered By: Secundino Ginger on 12/27/2018 11:14:48 [name], [name] ([id number]) -------------------------------------------------------------------------------- Patient/Caregiver Education Details Patient Name: [name] Date of Service: 12/27/2018 11:00 AM Medical Record Number: [id number] Patient Account Number: [id number] Date of Birth/Gender: [birth year] (85 y.o. F) Treating RN: Montey Hora Primary Care Physician: Grayland Ormond Other Clinician: Referring Physician: Grayland Ormond Treating Physician/Extender: Sharalyn Ink in Treatment: 63 Education Assessment Education Provided To: Patient and  Caregiver Education Topics Provided Offloading: Handouts: Other: keeping pressure off ankles Methods: Explain/Verbal Responses: State content correctly Electronic Signature(s) Signed: 12/27/2018 3:43:47 PM By: Montey Hora Entered By: Montey Hora on 12/27/2018 12:40:46 [name], [name] ([id number]) -------------------------------------------------------------------------------- Wound Assessment Details Patient Name: [name] Date of Service: 12/27/2018 11:00 AM Medical Record Number: [id number] Patient Account Number: [id number] Date  of Birth/Sex: 15-Aug-1923 (85 y.o. F) Treating RN: Secundino Ginger Primary Care [name] Haggart: Grayland Ormond Other Clinician: Referring [name] [name]: Grayland Ormond Treating Sheylin Scharnhorst/Extender: STONE III, HOYT Weeks in Treatment: 72 Wound Status Wound Number: 2 Primary Pressure Ulcer Etiology: Wound Location: Left Malleolus - Lateral Secondary Arterial Insufficiency Ulcer Wounding Event: Gradually Appeared Etiology: Date Acquired: 04/12/2018 Wound Status: Open Weeks Of Treatment: 36 Comorbid Cataracts, Congestive Heart Failure, Clustered Wound: No History: Hypertension, Osteoarthritis, Neuropathy Photos Photo Uploaded By: Secundino Ginger on 12/27/2018 11:51:58 Wound Measurements Length: (cm) 0.8 % Reduction Width: (cm) 0.7 % Reduction Depth: (cm) 0.4 Epitheliali Area: (cm) 0.44 Tunneling: Volume: (cm) 0.176 Underminin in Area: 43.9% in Volume: -122.8% zation: None No g: No Wound Description Classification: Category/Stage IV Foul Odor A Wound Margin: Epibole Slough/Fibr Exudate Amount: Medium Exudate Type: Serous Exudate Color: amber fter Cleansing: No ino Yes Wound Bed Granulation Amount: Large (67-100%) Exposed Structure Granulation Quality: Pink Fascia Exposed: No Necrotic Amount: Small (1-33%) Fat Layer (Subcutaneous Tissue) Exposed: Yes Necrotic Quality: Eschar Tendon Exposed: No Muscle Exposed: No Joint Exposed: No Bone  Exposed: No Periwound Skin Texture Ryback, [name] ([id number]) Texture Color No Abnormalities Noted: No No Abnormalities Noted: No Callus: No Atrophie Blanche: No Crepitus: No Cyanosis: No Excoriation: No Ecchymosis: No Induration: No Erythema: No Rash: No Hemosiderin Staining: Yes Scarring: No Mottled: No Pallor: No Moisture Rubor: No No Abnormalities Noted: No Dry / Scaly: No Temperature / Pain Maceration: Yes Temperature: No Abnormality Tenderness on Palpation: Yes Wound Preparation Ulcer Cleansing: Rinsed/Irrigated with Saline Topical Anesthetic Applied: Other: lidocaine 4%, Treatment Notes Wound #2 (Left, Lateral Malleolus) Notes heel - prisma,; toes - mupirocin and silvercel; heel - bordered foam dressing; left malleolus - SNAP VAC, right malleolus - bactroban and bordered foam dressing for protection Electronic Signature(s) Signed: 12/27/2018 3:23:26 PM By: Secundino Ginger Entered By: Secundino Ginger on 12/27/2018 11:24:34 [name], [name] ([id number]) -------------------------------------------------------------------------------- Wound Assessment Details Patient Name: [name] Date of Service: 12/27/2018 11:00 AM Medical Record Number: [id number] Patient Account Number: [id number] Date of Birth/Sex: [birth year] (85 y.o. F) Treating RN: Secundino Ginger Primary Care Shandell Jallow: Grayland Ormond Other Clinician: Referring Wendell Nicoson: Grayland Ormond Treating Aiesha Leland/Extender: STONE III, HOYT Weeks in Treatment: 72 Wound Status Wound Number: 3 Primary Pressure Ulcer Etiology: Wound Location: Left Calcaneus Wound Open Wounding Event: Gradually Appeared Status: Date Acquired: 10/04/2018 Comorbid Cataracts, Congestive Heart Failure, Weeks Of Treatment: 11 History: Hypertension, Osteoarthritis, Neuropathy Clustered Wound: No Photos Photo Uploaded By: Secundino Ginger on 12/27/2018 11:52:22 Wound Measurements Length: (cm) 1.4 % Reduction Width: (cm) 1.6 %  Reduction Depth: (cm) 0.1 Epitheliali Area: (cm) 1.759 Tunneling: Volume: (cm) 0.176 Underminin in Area: 36% in Volume: 68% zation: None No g: No Wound Description Classification: Category/Stage II Foul Odor A Wound Margin: Indistinct, nonvisible Slough/Fibr Exudate Amount: Small Exudate Type: Serous Exudate Color: amber fter Cleansing: No ino Yes Wound Bed Granulation Amount: Small (1-33%) Exposed Structure Granulation Quality: Hyper-granulation Fascia Exposed: No Necrotic Amount: None Present (0%) Fat Layer (Subcutaneous Tissue) Exposed: Yes Tendon Exposed: No Muscle Exposed: No Joint Exposed: No Bone Exposed: No Periwound Skin Texture [name], [name] ([id number]) Texture Color No Abnormalities Noted: No No Abnormalities Noted: No Callus: No Atrophie Blanche: No Crepitus: No Cyanosis: No Excoriation: No Ecchymosis: No Induration: No Erythema: No Rash: No Hemosiderin Staining: No Scarring: No Mottled: No Pallor: No Moisture Rubor: No No Abnormalities Noted: No Dry / Scaly: No Temperature / Pain Maceration: Yes Temperature: No Abnormality Tenderness on Palpation: Yes Wound Preparation Ulcer Cleansing: Rinsed/Irrigated with Saline  Topical Anesthetic Applied: Other: lidocaine 4%, Treatment Notes Wound #3 (Left Calcaneus) Notes heel - prisma,; toes - mupirocin and silvercel; heel - bordered foam dressing; left malleolus - SNAP VAC, right malleolus - bactroban and bordered foam dressing for protection Electronic Signature(s) Signed: 12/27/2018 3:23:26 PM By: Secundino Ginger Entered By: Secundino Ginger on 12/27/2018 11:27:17 [name], [name] ([id number]) -------------------------------------------------------------------------------- Wound Assessment Details Patient Name: [name] Date of Service: 12/27/2018 11:00 AM Medical Record Number: [id number] Patient Account Number: [id number] Date of Birth/Sex: [birth year] (85 y.o. F) Treating RN: Secundino Ginger Primary Care Aylee Littrell: Grayland Ormond Other Clinician: Referring Milea Klink: Grayland Ormond Treating Donte Kary/Extender: STONE III, HOYT Weeks in Treatment: 72 Wound Status Wound Number: 4 Primary Pressure Ulcer Etiology: Wound Location: Right Toe Second - Distal Wound Open Wounding Event: Pressure Injury Status: Date Acquired: 10/23/2018 Comorbid Cataracts, Congestive Heart Failure, Weeks Of Treatment: 5 History: Hypertension, Osteoarthritis, Neuropathy Clustered Wound: No Photos Photo Uploaded By: Secundino Ginger on 12/27/2018 11:52:54 Wound Measurements Length: (cm) 0.1 Width: (cm) 0.1 Depth: (cm) 0.1 Area: (cm) 0.008 Volume: (cm) 0.001 % Reduction in Area: 88.7% % Reduction in Volume: 85.7% Tunneling: No Undermining: No Wound Description Classification: Category/Stage II Exudate Amount: None Present Wound Bed Granulation Amount: None Present (0%) Exposed Structure Necrotic Amount: Large (67-100%) Fascia Exposed: No Necrotic Quality: Eschar Fat Layer (Subcutaneous Tissue) Exposed: Yes Tendon Exposed: No Muscle Exposed: No Joint Exposed: No Bone Exposed: No Periwound Skin Texture Texture Color No Abnormalities Noted: No No Abnormalities Noted: No Callus: No Atrophie Blanche: No [place], [name] ([id number]) Crepitus: No Cyanosis: No Excoriation: No Ecchymosis: No Induration: No Erythema: No Rash: No Hemosiderin Staining: No Scarring: No Mottled: No Pallor: No Moisture Rubor: No No Abnormalities Noted: No Dry / Scaly: No Maceration: No Wound Preparation Ulcer Cleansing: Rinsed/Irrigated with Saline Topical Anesthetic Applied: Other: lidocaine 4%, Treatment Notes Wound #4 (Right, Distal Toe Second) Notes heel - prisma,; toes - mupirocin and silvercel; heel - bordered foam dressing; left malleolus - SNAP VAC, right malleolus - bactroban and bordered foam dressing for protection Electronic Signature(s) Signed: 12/27/2018 3:23:26 PM By: Secundino Ginger Entered By: Secundino Ginger on 12/27/2018 11:29:22 [name], [name] ([id number]) -------------------------------------------------------------------------------- Wound Assessment Details Patient Name: [name] Date of Service: 12/27/2018 11:00 AM Medical Record Number: [id number] Patient Account Number: [id number] Date of Birth/Sex: [birth year] (85 y.o. F) Treating RN: Secundino Ginger Primary Care Cathie Bonnell: Grayland Ormond Other Clinician: Referring Mackena Plummer: Grayland Ormond Treating Aletha Allebach/Extender: STONE III, HOYT Weeks in Treatment: 72 Wound Status Wound Number: 5 Primary Pressure Ulcer Etiology: Wound Location: Right Toe Great - Distal Wound Open Wounding Event: Footwear Injury Status: Date Acquired: 12/03/2018 Comorbid Cataracts, Congestive Heart Failure, Weeks Of Treatment: 3 History: Hypertension, Osteoarthritis, Neuropathy Clustered Wound: No Photos Photo Uploaded By: Secundino Ginger on 12/27/2018 11:53:28 Wound Measurements Length: (cm) 0.4 Width: (cm) 0.4 Depth: (cm) 0.1 Area: (cm) 0.126 Volume: (cm) 0.013 % Reduction in Area: 64.3% % Reduction in Volume: 62.9% Tunneling: No Undermining: No Wound Description Classification: Category/Stage II Foul Odo Exudate Amount: None Present Slough/F r After Cleansing: No ibrino Yes Wound Bed Granulation Amount: None Present (0%) Exposed Structure Necrotic Amount: Small (1-33%) Fascia Exposed: No Necrotic Quality: Adherent Slough Fat Layer (Subcutaneous Tissue) Exposed: Yes Tendon Exposed: No Muscle Exposed: No Joint Exposed: No Bone Exposed: No Periwound Skin Texture Texture Color No Abnormalities Noted: No No Abnormalities Noted: No [place], [name] ([id number]) Moisture No Abnormalities Noted: No Wound Preparation Ulcer Cleansing: Rinsed/Irrigated with Saline Topical Anesthetic Applied: Other: lidocaine 4%,  Treatment Notes Wound #5 (Right, Distal Toe Great) Notes heel - prisma,; toes - mupirocin  and silvercel; heel - bordered foam dressing; left malleolus - SNAP VAC, right malleolus - bactroban and bordered foam dressing for protection Electronic Signature(s) Signed: 12/27/2018 3:23:26 PM By: Secundino Ginger Entered By: Secundino Ginger on 12/27/2018 11:32:50 Mullings, [name] ([id number]) -------------------------------------------------------------------------------- Vitals Details Patient Name: [name] Date of Service: 12/27/2018 11:00 AM Medical Record Number: [id number] Patient Account Number: [id number] Date of Birth/Sex: 1923-05-26 (85 y.o. F) Treating RN: Secundino Ginger Primary Care Jeslyn Amsler: Grayland Ormond Other Clinician: Referring Amiree No: Grayland Ormond Treating Insiya Oshea/Extender: STONE III, HOYT Weeks in Treatment: 72 Vital Signs Time Taken: 11:14 Temperature (F): 98.1 Height (in): 65 Pulse (bpm): 89 Weight (lbs): 154.3 Respiratory Rate (breaths/min): 18 Body Mass Index (BMI): 25.7 Blood Pressure (mmHg): 116/52 Reference Range: 80 - 120 mg / dl Airway Pulse Oximetry (%): 98 Electronic Signature(s) Signed: 12/27/2018 3:23:26 PM By: Secundino Ginger Entered BySecundino Ginger on 12/27/2018 11:16:51

## 2018-12-31 NOTE — Progress Notes (Signed)
MALEKA, CONTINO ([id number]) Visit Report for 12/27/2018 Chief Complaint Document Details Patient Name: STARLENA, BEIL. Date of Service: 12/27/2018 11:00 AM Medical Record Number: [id number] Patient Account Number: [id number] Date of Birth/Sex: 02/02/1923 (85 y.o. F) Treating RN: Montey Hora Primary Care Provider: Grayland Ormond Other Clinician: Referring Provider: Grayland Ormond Treating Provider/Extender: Melburn Hake, HOYT Weeks in Treatment: 36 Information Obtained from: Patient Chief Complaint Patient is here for right lateral malleolus, right 2nd distal toe and left lateral malleolus ulcer Electronic Signature(s) Signed: 12/27/2018 5:23:14 PM By: Worthy Keeler PA-C Entered By: Worthy Keeler on 12/27/2018 11:33:51 Sockwell, [name] ([id number]) -------------------------------------------------------------------------------- Debridement Details Patient Name: [name] Date of Service: 12/27/2018 11:00 AM Medical Record Number: [id number] Patient Account Number: [id number] Date of Birth/Sex: [birth year] (85 y.o. F) Treating RN: Montey Hora Primary Care Provider: Grayland Ormond Other Clinician: Referring Provider: Grayland Ormond Treating Provider/Extender: Melburn Hake, HOYT Weeks in Treatment: 72 Debridement Performed for Wound #5 Right,Distal Toe Great Assessment: Performed By: Physician STONE III, HOYT E., PA-C Debridement Type: Debridement Level of Consciousness (Pre- Awake and Alert procedure): Pre-procedure Verification/Time Yes - 11:48 Out Taken: Start Time: 11:48 Pain Control: Lidocaine 4% Topical Solution Total Area Debrided (L x W): 0.4 (cm) x 0.4 (cm) = 0.16 (cm) Tissue and other material Viable, Non-Viable, Callus, Slough, Subcutaneous, Slough debrided: Level: Skin/Subcutaneous Tissue Debridement Description: Excisional Instrument: Curette Bleeding: Minimum Hemostasis Achieved: Pressure End Time: 11:50 Procedural Pain: 0 Post  Procedural Pain: 0 Response to Treatment: Procedure was tolerated well Level of Consciousness Awake and Alert (Post-procedure): Post Debridement Measurements of Total Wound Length: (cm) 0.4 Stage: Category/Stage II Width: (cm) 0.5 Depth: (cm) 0.5 Volume: (cm) 0.079 Character of Wound/Ulcer Post Improved Debridement: Post Procedure Diagnosis Same as Pre-procedure Electronic Signature(s) Signed: 12/27/2018 3:43:47 PM By: Montey Hora Signed: 12/27/2018 5:23:14 PM By: Worthy Keeler PA-C Entered By: Montey Hora on 12/27/2018 11:50:15 Phimmasone, [name] ([id number]) -------------------------------------------------------------------------------- Debridement Details Patient Name: [name] Date of Service: 12/27/2018 11:00 AM Medical Record Number: [id number] Patient Account Number: [id number] Date of Birth/Sex: Jan 12, 1923 (85 y.o. F) Treating RN: Montey Hora Primary Care Provider: Grayland Ormond Other Clinician: Referring Provider: Grayland Ormond Treating Provider/Extender: Melburn Hake, HOYT Weeks in Treatment: 72 Debridement Performed for Wound #4 Right,Distal Toe Second Assessment: Performed By: Physician STONE III, HOYT E., PA-C Debridement Type: Debridement Level of Consciousness (Pre- Awake and Alert procedure): Pre-procedure Verification/Time Yes - 11:50 Out Taken: Start Time: 11:50 Pain Control: Lidocaine 4% Topical Solution Total Area Debrided (L x W): 0.1 (cm) x 0.1 (cm) = 0.01 (cm) Tissue and other material Viable, Non-Viable, Callus, Slough, Subcutaneous, Slough debrided: Level: Skin/Subcutaneous Tissue Debridement Description: Excisional Instrument: Curette Bleeding: Minimum Hemostasis Achieved: Pressure End Time: 11:53 Procedural Pain: 0 Post Procedural Pain: 0 Response to Treatment: Procedure was tolerated well Level of Consciousness Awake and Alert (Post-procedure): Post Debridement Measurements of Total Wound Length: (cm)  0.5 Stage: Category/Stage II Width: (cm) 0.5 Depth: (cm) 0.1 Volume: (cm) 0.02 Character of Wound/Ulcer Post Improved Debridement: Post Procedure Diagnosis Same as Pre-procedure Electronic Signature(s) Signed: 12/27/2018 3:43:47 PM By: Montey Hora Signed: 12/27/2018 5:23:14 PM By: Worthy Keeler PA-C Entered By: Montey Hora on 12/27/2018 11:53:10 Wyman, [name] ([id number]) -------------------------------------------------------------------------------- HPI Details Patient Name: [name] Date of Service: 12/27/2018 11:00 AM Medical Record Number: [id number] Patient Account Number: [id number] Date of Birth/Sex: 10-04-1923 (85 y.o. 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 Dr. Amalia Hailey the podiatrist saw her on 07/20/2017 and sent her to the wound center for possible hyperbaric oxygen therapy. past medical history of peripheral vascular disease, varicose veins, status post appendectomy, basal cell carcinoma excision from the left leg, cholecystectomy, pacemaker placement, right lower extremity angiography done by Dr. dew in March 2017 with placement of a stent. there is also note of a successful ablation of the right small saphenous vein done which was reviewed by ultrasound on 10/24/2016. the patient had a right small saphenous vein ablation done on 10/20/2016. The patient has never been a smoker. She has been seen by Dr. Corene Cornea dew the vascular surgeon who most recently saw her on 06/15/2017 for evaluation of ongoing problems with right leg swelling. She had a lower extremity  arterial duplex examination done(02/13/17) which showed patent distal right superficial femoral artery stent and above-the-knee popliteal stent without evidence of restenosis. The ABI was more than 1.3 on the right and more than 1.3 on the left. This was consistent with noncompressible arteries due to medial calcification. The right great toe pressure and PPG waveforms are within normal limits and the left great toe pressure and PPG waveforms are decreased. he recommended she continue to wear her compression stockings and continue with elevation. She is scheduled to have a noninvasive arterial study in the near future 08/16/2017 -- had a lower extremity arterial duplex examination done which showed patent distal right superficial femoral artery stent and above-the-knee popliteal stent without evidence of restenosis. The ABI was more than 1.3 on the right and more than 1.3 on the left. This was consistent with noncompressible arteries due to medial calcification. The right great toe pressure and PPG waveforms are within normal limits and the left great toe pressure and PPG waveforms are decreased. the x-ray of the right ankle has not yet been done 08/24/2017 -- had a right ankle x-ray -- IMPRESSION:1. No fracture, bone lesion or evidence of osteomyelitis. 2. Lateral soft tissue swelling with a soft tissue ulcer. she has not yet seen the vascular surgeon for review 08/31/17 on evaluation today patient's wound appears to be showing signs of improvement. She still with her appointment with vascular in order to review her results of her vascular study and then determine if any intervention would be recommended at that time. No fevers, chills, nausea, or vomiting noted at this time. She has been tolerating the dressing changes without complication. 09/28/17 on evaluation today patient's wound appears to show signs of good improvement in regard to the granulation tissue which is surfacing. There is still  a layer of slough covering the wound and the posterior portion is still significantly deeper than the anterior nonetheless there has been some good sign of things moving towards the better. She is going to go back to Dr. dew for reevaluation to ensure her blood flow is still appropriate. That will be before her next evaluation with [place] next week. No fevers, chills, nausea, or vomiting noted at this time. Patient does have some discomfort rated to be a 3-4/10 depending on activity specifically cleansing the wound makes it worse. 10/05/2017 -- the patient was seen by Dr. Lucky Cowboy last week and noninvasive studies showed a normal right ABI with brisk triphasic waveforms consistent with no arterial insufficiency including  normal digital pressures. The duplex showed a patent distal right SFA stent and the proximal SFA was also normal. He was pleased with her test and thought she should have enough of perfusion for normal wound healing. He would see her back in 6 months time. 12/21/17 on evaluation today patient appears to be doing fairly well in regard to her right lateral ankle wound. Unfortunately the main issue that she is expansion at this point is that she is having some issues with what appears to be some cellulitis in the ALDINA, PORTA. ([id number]) right anterior shin. She has also been noting a little bit of uncomfortable feeling especially last night and her ankle area. I'm afraid that she made the developing a little bit of an infection. With that being said I think it is in the early stages. 12/28/17 on evaluation today patient's ankle appears to be doing excellent. She's making good progress at this point the cellulitis seems to have improved after last week's evaluation. Overall she is having no significant discomfort which is excellent news. She does have an appointment with Dr. dew on March 29, 2018 for reevaluation in regard to the stent he placed. She seems to have excellent blood flow in  the right lower extremity. 01/19/12 on evaluation today patient's wound appears to be doing very well. In fact she does not appear to require debridement at this point, there's no evidence of infection, and overall from the standpoint of the wound she seems to be doing very well. With that being said I believe that it may be time to switch to different dressing away from the [facility] Dressing she tells me she does have a lot going on her friend actually passed away yesterday and she's also having a lot of issues with her husband this obviously is weighing heavy on her as far as your thoughts and concerns today. 01/25/18 on evaluation today patient appears to be doing fairly well in regard to her right lateral malleolus. She has been tolerating the dressing changes without complication. Overall I feel like this is definitely showing signs of improvement as far as how the overall appearance of the wound is there's also evidence of epithelium start to migrate over the granulation tissue. In general I think that she is progressing nicely as far as the wound is concerned. The only concern she really has is whether or not we can switch to every other week visits in order to avoid having as many appointments as her daughters have a difficult time getting her to her appointments as well as the patient's husband to his he is not doing very well at this point. 02/22/18 on evaluation today patient's right lateral malleolus ulcer appears to be doing great. She has been tolerating the dressing changes without complication. Overall you making excellent progress at this time. Patient is having no significant discomfort. 03/15/18 on evaluation today patient appears to be doing much more poorly in regard to her right lateral ankle ulcer at this point. Unfortunately since have last seen her her husband has passed just a few days ago is obviously weighed heavily on her her daughter also had surgery well she is with  her today as usual. There does not appear to be any evidence of infection she does seem to have significant contusion/deep tissue injury to the right lateral malleolus which was not noted previous when I saw her last. It's hard to tell of exactly when this injury occurred although during the time she was  spending the night in the hospital this may have been most likely. 03/22/18 on evaluation today patient appears to actually be doing very well in regard to her ulcer. She did unfortunately have a setback which was noted last week however the good news is we seem to be getting back on track and in fact the wound in the core did still have some necrotic tissue which will be addressed at this point today but in general I'm seeing signs that things are on the up and up. She is glad to hear this obviously she's been somewhat concerned that due to the how her wound digressed more recently. 03/29/18 on evaluation today patient appears to be doing fairly well in regard to her right lower extremity lateral malleolus ulcer. She unfortunately does have a new area of pressure injury over the inferior portion where the wound has opened up a little bit larger secondary to the pressure she seems to be getting. She does tell me sometimes when she sleeps at night that it actually hurts and does seem to be pushing on the area little bit more unfortunately. There does not appear to be any evidence of infection which is good news. She has been tolerating the dressing changes without complication. She also did have some bruising in the left second and third toes due to the fact that she may have bump this or injured it although she has neuropathy so she does not feel she did move recently that may have been where this came from. Nonetheless there does not appear to be any evidence of infection at this time. 04/12/18 on evaluation today patient's wound on the right lateral ankle actually appears to be doing a little bit  better with a lot of necrotic docking tissue centrally loosening up in clearing away. However she does have the beginnings of a deep tissue injury on the left lateral malleolus likely due to the fact we've been trying offload the right as much as we have. I think she may benefit from an assistive soft device to help with offloading and it looks like they're looking at one of the doughnut conditions that wraps around the lower leg to offload which I think will definitely do a good job. With that being said I think we definitely need to address this issue on the left before it becomes a wound. Patient is not having significant pain. 04/19/18 on evaluation today patient appears to be doing excellent in regard to the progress she's made with her right lateral ankle ulcer. The left ankle region which did show evidence of a deep tissue injury seems to be resolving there's little fluid noted underneath and a blister there's nothing open at this point in time overall I feel like this is progressing nicely which is good news. She does not seem to be having significant discomfort at this point which is also good news. 04/25/18-She is here in follow up evaluation for bilateral lateral malleolar ulcers. The right lateral malleolus ulcer with pale subcutaneous tissue exposure, central area of ulcer with tendon/periosteum exposed. The left lateral malleolus ulcer now with Ribble, [name] ([id number]) central area of nonviable tissue, otherwise deep tissue injury. She is wearing compression wraps to the left lower extremity, she will place the right lower extremity compression wraps on when she gets home. She will be out of town over the weekend and return next week and follow-up appointment. She completed her doxycycline this morning 05/03/18 on evaluation today patient appears to be doing  very well in regard to her right lateral ankle ulcer in general. At least she's showing some signs of improvement in this  regard. Unfortunately she has some additional injury to the left lateral malleolus region which appears to be new likely even over the past several days. Again this determination is based on the overall appearance. With that being said the patient is obviously frustrated about this currently. 05/10/18-She is here in follow-up evaluation for bilateral lateral malleolar ulcers. She states she has purchased offloading shoes/boots and they will arrive tomorrow. She was asked to bring them in the office at next week's appointment so her provider is aware of product being utilized. She continues to sleep on right or left side, she has been encouraged to sleep on her back. The right lateral malleolus ulcer is precariously close to peri-osteum; will order xray. The left lateral malleolus ulcer is improved. Will switch back to santyl; she will follow up next week. 05/17/18 on evaluation today patient actually appears to be doing very well in regard to her malleolus her ulcers compared to last time I saw them. She does not seem to have as much in the way of contusion at this point which is great news. With that being said she does continue to have discomfort and I do believe that she is still continuing to benefit from the offloading/pressure reducing boots that were recommended. I think this is the key to trying to get this to heal up completely. 05/24/18 on evaluation today patient actually appears to be doing worse at this point in time unfortunately compared to her last week's evaluation. She is having really no increased pain which is good news unfortunately she does have more maceration in your theme and noted surrounding the right lateral ankle the left lateral ankle is not really is erythematous I do not see signs of the overt cellulitis on that side. Unfortunately the wounds do not seem to have shown any signs of improvement since the last evaluation. She also has significant swelling especially on the  right compared to previous some of this may be due to infection however also think that she may be served better while she has these wounds by compression wrapping versus continuing to use the Juxta-Lite for the time being. Especially with the amount of drainage that she is experiencing at this point. No fevers, chills, nausea, or vomiting noted at this time. 05/31/18 on evaluation today patient appears to actually be doing better in regard to her right lateral lower extremity ulcer specifically on the malleolus region. She has been tolerating the antibiotic without complication. With that being said she still continues to have issues but a little bit of redness although nothing like she what she was experiencing previous. She still continues to pressure to her ankle area she did get the problem on offloading boots unfortunately she will not wear them she states there too uncomfortable and she can't get in and out of the bed. Nonetheless at this point her wounds seem to be continually getting worse which is not what we want I'm getting somewhat concerned about her progress and how things are going to proceed if we do not intervene in some way shape or form. I therefore had a very lengthy conversation today about offloading yet again and even made a specific suggestion for switching her to a memory foam mattress and even gave the information for a specific one that they could look at getting if it was something that they were interested  in considering. She does not want to be considered for a hospital bed air mattress although honestly insurance would not cover it that she does not have any wounds on her trunk. 06/14/18 on evaluation today both wounds over the bilateral lateral malleolus her ulcers appear to be doing better there's no evidence of pressure injury at this point. She did get the foam mattress for her bed and this does seem to have been extremely beneficial for her in my pinion. Her  daughter states that she is having difficulty getting out of bed because of how soft it is. The patient also relates this to be. Nonetheless I do feel like she's actually doing better. Unfortunately right after and around the time she was getting the mattress she also sustained a fall when she got up to go pick up the phone and ended up injuring her right elbow she has [address] sut[address]. We are not caring for this currently although home health is going to be taking the sutures out shortly. Nonetheless this may be something that we need to evaluate going forward. It depends on how well it has or has not healed in the end. She also recently saw an orthopedic specialist for an injection in the right shoulder just before her fall unfortunately the fall seems to have worsened her pain. 06/21/18 on evaluation today patient appears to be doing about the same in regard to her lateral malleolus ulcers. Both appear to be just a little bit deeper but again we are clinging away the necrotic and dead tissue which I think is why this is progressing towards a deeper realm as opposed to improving from my measurement standpoint in that regard. Nonetheless she has been tolerating the dressing changes she absolutely hates the memory foam mattress topper that was obtained for her nonetheless I do believe this is still doing excellent as far as taking care of excess pressure in regard to the lateral malleolus regions. She in fact has no pressure injury that I see whereas in weeks past it was week by week I was constantly seeing new pressure injuries. Overall I think it has been very beneficial for her. 07/03/18; patient arrives in my clinic today. She has deep punched out areas over her bilateral lateral malleoli. The area on the right has some more depth. NYNA, CHILTON ([id number]) We spent a lot of time today talking about pressure relief for these areas. This started when her daughter asked for a prescription  for a memory foam mattress. I have never written a prescription for a mattress and I don't think insurances would pay for that on an ordinary bed. In any case he came up that she has foam boots that she refuses to wear. I would suggest going to these before any other offloading issues when she is in bed. They say she is meticulous about offloading this the rest of the day 07/10/18- She is seen in follow-up evaluation for bilateral, lateral malleolus ulcers. There is no improvement in the ulcers. She has purchased and is sleeping on a memory foam mattress/overlay, she has been using the offloading boots nightly over the past week. She has a follow up appointment with vascular medicine at the end of October, in my opinion this follow up should be expedited given her deterioration and suboptimal TBI results. We will order plain film xray of the left ankle as deeper structures are palpable; would consider having MRI, regardless of xray report(s). The ulcers will be treated with  iodoflex/iodosorb, she is unable to safely change the dressings daily with santyl. 07/19/18 on evaluation today patient appears to be doing in general visually well in regard to her bilateral lateral malleolus ulcers. She has been tolerating the dressing changes without complication which is good news. With that being said we did have an x-ray performed on 07/12/18 which revealed a slight loosen see in the lateral portion of the distal left fibula which may represent artifact but underline lytic destruction or osteomyelitis could not be excluded. MRI was recommended. With that being said we can see about getting the patient scheduled for an MRI to further evaluate this area. In fact we have that scheduled currently for August 20 19,019. 07/26/18 on evaluation today patient's wound on the right lateral ankle actually appears to be doing fairly well at this point in my pinion. She has made some good progress currently. With that being  said unfortunately in regard to the left lateral ankle ulcer this seems to be a little bit more problematic at this time. In fact as I further evaluated the situation she actually had bone exposed which is the first time that's been the case in the bone appear to be necrotic. Currently I did review patient's note from Dr. Bunnie Domino office with  Vein and Vascular surgery. He stated that ABI was 1.26 on the right and 0.95 on the left with good waveforms. Her perfusion is stable not reduced from previous studies and her digital waveforms were pretty good particularly on the right. His conclusion upon review of the note was that there was not much she could do to improve her perfusion and he felt she was adequate for wound healing. His suggestion was that she continued to see [place] and consider a synthetic skin graft if there was no underlying infection. He plans to see her back in six months or as needed. 08/01/18 on evaluation today patient appears to be doing better in regard to her right lateral ankle ulcer. Her left lateral ankle ulcer is about the same she still has bone involvement in evidence of necrosis. There does not appear to be evidence of infection at this time On the right lateral lower extremity. I have started her on the Augmentin she picked this up and started this yesterday. This is to get her through until she sees infectious disease which is scheduled for 08/12/18. 08/06/18 on evaluation today patient appears to be doing rather well considering my discussion with patient's daughter at the end of last week. The area which was marked where she had erythema seems to be improved and this is good news. With that being said overall the patient seems to be making good improvement when it comes to the overall appearance of the right lateral ankle ulcer although this has been slow she at least is coming around in this regard. Unfortunately in regard to the left lateral ankle ulcer this is  osteomyelitis based on the pathology report as well is bone culture. Nonetheless we are still waiting CT scan. Unfortunately the MRI we originally ordered cannot be performed as the patient is a pacemaker which I had overlooked. Nonetheless we are working on the CT scan approval and scheduling as of now. She did go to the hospital over the weekend and was placed on IV Cefzo for a couple of days. Fortunately this seems to have improved the erythema quite significantly which is good news. There does not appear to be any evidence of worsening infection at this time. She  did have some bleeding after the last debridement therefore I did not perform any sharp debridement in regard to left lateral ankle at this point. Patient has been approved for a snap vac for the right lateral ankle. 08/14/18; the patient with wounds over her bilateral lateral malleoli. The area on the right actually looks quite good. Been using a snap back on this area. Healthy granulation and appears to be filling in. Unfortunately the area on the left is really problematic. 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 Also noted to have cellulitis. She saw Dr. Novella Olive of infectious disease today and was put on linezolid. We are able to verify this with her pharmacy. She is completed the Augmentin that she was already on. We've been using Iodoflex to this area 08/23/18 on evaluation today patient's wounds both actually appear to be doing better compared to my prior evaluations. Fortunately she showing signs of good improvement in regard to the overall wound status especially where were using the snap vac on the right. In regard to left lateral malleolus the wound bed actually appears to be much cleaner than previously noted. I do not feel any phone directly probed during evaluation today and though there is tendon noted this does not appear Gorrell, [name] ([id number]) to be necrotic it's actually fairly good as far as the overall appearance of the tendon is concerned. In general the wound bed actually appears to be doing significantly better than it was previous. Patient is currently in the care of [name] and I did review that note today. He actually has her on two weeks of linezolid and then following the patient will be on 1-2 months of Keflex. That is the plan currently. She has been on antibiotics therapy as prescribed by myself initially starting on July 30, 2018 and has been on that continuously up to this point. 08/30/18 on evaluation today patient actually appears to be doing much better in regard to her right lateral malleolus ulcer. She has been tolerating the dressing changes specifically the snap vac without complication although she did have some issues with the seal currently. Apparently there was some trouble with getting it to maintain over the past week past Sunday. Nonetheless overall the wound appears better in regard to the right lateral malleolus region. In regard to left lateral malleolus this actually show some signs of additional granulation although there still tendon noted in the base of the wound this appears to be healthy not necrotic in any way whatsoever. We are considering potentially using a snap vac for the left lateral malleolus as well the product wrap from [employer], [place], was present in the clinic today we're going to see this patient I did have her come in with me after obtaining consent from the patient and her daughter in order to look at the wound and see if there's any recommendation one way or another as to whether or not they felt the snapback could be beneficial for the left lateral malleolus region. But the conclusion was that it might be but that this is definitely a little bit deeper wound than what traditionally would be utilized for a snap vac. 09/06/18 on evaluation today patient actually appears  to be doing excellent in my pinion in regard to both ankle ulcers. She has been tolerating the dressing changes without complication which is great news. Specifically we have been using the snap vac. In  regard to the right ankle I'm not even sure that this is going to be necessary for today and following as the wound has filled in quite nicely. In regard to the left ankle I do believe that we're seeing excellent epithelialization from the edge as well as granulation in the central portion the tendon is still exposed but there's no evidence of necrotic bone and in general I feel like the patient has made excellent progress even compared to last week with just one week of the snap vac. 09/11/18; this is a patient who has wounds on her bilateral lateral malleoli. Initially both of these were deep stage IV wounds in the setting of chronic arterial insufficiency. She has been revascularized. As I understand think she been using snap vacs to both of these wounds however the area on the right became more superficial and currently she is only using it on the left. Using silver collagen on the right and silver collagen under the back on the left I believe 09/19/18 on evaluation today patient actually appears to be doing very well in regard to her lateral malleolus or ulcers bilaterally. She has been tolerating the dressing changes without complication. Fortunately there does not appear to be any evidence of infection at this time. Overall I feel like she is improving in an excellent manner and I'm very pleased with the fact that everything seems to be turning towards the better for her. This has obviously been a long road. 09/27/18 on evaluation today patient actually appears to be doing very well in regard to her bilateral lateral malleolus ulcers. She has been tolerating the dressing changes without complication. Fortunately there does not appear to be any evidence of infection at this time which is also  great news. No fevers, chills, nausea, or vomiting noted at this time. Overall I feel like she is doing excellent with the snap vac on the left malleolus. She had 40 mL of fluid collection over the past week. 10/04/18 on evaluation today patient actually appears to be doing well in regard to her bilateral lateral malleolus ulcers. She continues to tolerate the dressing changes without complication. One issue that I see is the snap vac on the left lateral malleolus which appears to have sealed off some fluid underlying this area and has not really allowed it to heal to the degree that I would like to see. For that reason I did suggest at this point we may want to pack a small piece of packing strip into this region to allow it to more effectively wick out fluid. 10/11/18 in general the patient today does not feel that she has been doing very well. She's been a little bit lethargic and subsequently is having bodyaches as well according to what she tells me today. With that being said overall she has been concerned with the fact that something may be worsening although to be honest her wounds really have not been appearing poorly. She does have a new ulcer on her left heel unfortunately. This may be pressure related. Nonetheless it seems to me to have potentially started at least as a blister I do not see any evidence of deep tissue injury. In regard to the left ankle the snap vac still seems to be causing the ceiling off of the deeper part of the wound which is in turn trapping fluid. I'm not extremely pleased with the overall appearance as far as progress from last week to this week therefore I'm gonna discontinue the snap  vac at this point. 10/18/18 patient unfortunately this point has not been feeling well for the past several days. She was seen by Grayland Ormond her primary care provider who is a Librarian, academic at [facility]. Subsequently she states that she's been very weak and generally  feeling malaise. No fevers, chills, nausea, or vomiting noted at this time. With that being said bloodwork was performed at the PCP office on the 11th of this month which showed a white blood cell count of 10.7. This was repeated today Babson, [name] ([id number]) and shows a white blood cell count of 12.4. This does show signs of worsening. Coupled with the fact that she is feeling worse and that her left ankle wound is not really showing signs of improvement I feel like this is an indication that the osteomyelitis is likely exacerbating not improving. Overall I think we may also want to check her C-reactive protein and sedimentation rate. Actually did call [facility] office this afternoon while the patient was in the office here with me. Subsequently based on the findings we discussed treatment possibilities and I think that it is appropriate for [place] to go ahead and initiate treatment with doxycycline which I'm going to do. Subsequently he did agree to see about adding a CRP and sedimentation rate to her orders. If that has not already been drawn to where they can run it they will contact the patient she can come back to have that check. They are in agreement with plan as far as the patient and her daughter are concerned. Nonetheless also think we need to get in touch with Dr. Henreitta Leber office to see about getting the patient scheduled with him as soon as possible. 11/08/18 on evaluation today patient presents for follow-up concerning her bilateral foot and ankle ulcers. I did do an extensive review of her chart in epic today. Subsequently she was seen by [name] he did initiate Cefepime IV antibiotic therapy. Subsequently she had some issues with her PICC line this had to be removed because it was coiled and then replaced. Fortunately that was now settled. Unfortunately she has continued have issues with her left heel as well as the issues that she is experiencing with her bilateral  lateral malleolus regions. I do believe however both areas seem to be doing a little bit better on evaluation today which is good news. No fevers, chills, nausea, or vomiting noted at this time. She actually has an angiogram schedule with Dr. dew on this coming Monday, November 11, 2018. Subsequently the patient states that she is feeling much better especially than what she was roughly 2 weeks ago. She actually had to cancel an appointment because she was feeling so poorly. No fevers, chills, nausea, or vomiting noted at this time. 11/15/18 on evaluation today patient actually is status post having had her angiogram with Dr. dew Monday, four days ago. 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 He had to go and work on several areas of the vasculature fortunately he was able to obtain no more than a 30% residual stenosis throughout post procedure. I reviewed this note today. I think this will definitely help with healing at this time. Fortunately there does not appear to be any signs of infection and I do feel like ratio already has a better appearance to it. 11/22/18 upon evaluation today patient actually appears to be doing very well in regard to her wounds in general. The right lateral  malleolus looks excellent the heel looks better in the left lateral malleolus also appears to be doing a little better. With that being said the right second toe actually appears to be open and training we been watching this is been dry and stable but now is open. 12/03/2018 Seen today for follow-up and management of multiple bilateral lower extremity wounds. New pressure injury of the great toe which is closed at this time. Wound of the right distal second toe appears larger today with deep undermining and a pocket of fluid present within the undermining region. Left and right malleolus is wounds are stable today with no signs and symptoms of infection.Denies any needs or concerns during  exam today. 12/13/18 on evaluation today patient appears to be doing somewhat better in regard to her left heel ulcer. She also seems to be completely healed in regard to the right lateral malleolus ulcer. The left malleolus ulcer is smaller what unfortunately the wounds which are new over the first and second toes of the right foot are what are most concerning at this point especially the second. Both areas did require sharp debridement today. 12/20/18 on evaluation today patient's wound actually appears to be doing better in regard to left lateral ankle and her right lateral ankle continues to remain healed. The hill ulcer on the left is improved. She does have improvement noted as well in regard to both toe ulcers. Overall I'm very pleased in this regard. No fevers, chills, nausea, or vomiting noted at this time. 12/23/18 on evaluation today patient is seen after she had her toenails trimmed at the podiatrist office due to issues with her right great toe. There was what appeared to be dark eschar on the surface of the wound which had her in the podiatrist concerned. Nonetheless as I remember that during the last office visit I had utilize silver nitrate of this area I was much less concerned about the situation. Subsequently I was able to clean off much of this tissue without any complication today. This does not appear to show any signs of infection and actually look somewhat better compared to last time post debridement. Her second toe on the right foot actually had callous over and there did appear still be some fluid underneath this that would require debridement today. 12/27/18 on evaluation today patient actually appears to be showing signs of improvement at all locations. Even the left lateral ankle although this is not quite as great as the other sites. Fortunately there does not appear to be any signs of infection at this time and both of her toes on the right foot seem to be showing signs  of improvement which is good news and very pleased in this regard. RUFUS, CYPERT ([id number]) Electronic Signature(s) Signed: 12/27/2018 5:23:14 PM By: Worthy Keeler PA-C Entered By: Worthy Keeler on 12/27/2018 17:02:25 Pratte, [name] ([id number]) -------------------------------------------------------------------------------- Physical Exam Details Patient Name: TANEKA, ESPIRITU Date of Service: 12/27/2018 11:00 AM Medical Record Number: [id number] Patient Account Number: [id number] Date of Birth/Sex: 08-05-1923 (85 y.o. F) Treating RN: Montey Hora Primary Care Provider: Grayland Ormond Other Clinician: Referring Provider: Grayland Ormond Treating Provider/Extender: STONE III, HOYT Weeks in Treatment: 72 Constitutional Well-nourished and well-hydrated in no acute distress. Respiratory normal breathing without difficulty. clear to auscultation bilaterally. Cardiovascular regular rate and rhythm with normal S1, S2. Psychiatric this patient is able to make decisions and demonstrates good insight into disease process. Alert and Oriented x 3. pleasant and cooperative. Notes Patient's wounds  all seem to show signs of good improvement the one difference is that she does seem to be having a harder time getting the left lateral ankle to heal compared to everything else. She also has a little bit more swelling today in the left leg compared to normal although she has been taken off some of her Lasix. With all that being said I do believe that she overall is shown signs of improvement. Electronic Signature(s) Signed: 12/27/2018 5:23:14 PM By: Worthy Keeler PA-C Entered By: Worthy Keeler on 12/27/2018 17:06:41 Murrillo, [name] ([id number]) -------------------------------------------------------------------------------- Physician Orders Details Patient Name: [name] Date of Service: 12/27/2018 11:00 AM Medical Record Number: [id number] Patient Account Number:  [id number] Date of Birth/Sex: [birth year] (85 y.o. F) Treating RN: Montey Hora Primary Care Provider: Grayland Ormond Other Clinician: Referring Provider: Grayland Ormond Treating Provider/Extender: Melburn Hake, HOYT Weeks in Treatment: 78 Verbal / Phone Orders: No Diagnosis Coding ICD-10 Coding Code Description 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, stage 4 L89.524 Pressure ulcer of left ankle, stage 4 L89.623 Pressure ulcer of left heel, stage 3 L89.892 Pressure ulcer of other site, stage 2 I70.233 Atherosclerosis of native arteries of right leg with ulceration of ankle I70.243 Atherosclerosis of native arteries of left leg with ulceration of ankle I89.0 Lymphedema, not elsewhere classified B35.4 Tinea corporis 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 o Cleanse wound with mild soap and water o May Shower, gently pat wound dry prior to applying new dressing. Wound #3 Left Calcaneus o Clean wound with Normal Saline. o Cleanse wound with mild soap and water o May Shower, gently pat wound dry prior to applying new dressing. Wound #4 Right,Distal Toe Second o Clean wound with Normal Saline. o Cleanse wound with mild soap and water o May Shower, gently pat wound dry prior to applying new dressing. Wound #5 Right,Distal Toe Great o Clean wound with Normal Saline. o Cleanse wound with mild soap and water o May Shower, gently pat wound dry prior to applying new dressing. Anesthetic (add to Medication List) Wound #2 Left,Lateral Malleolus o Topical Lidocaine 4% cream applied to wound bed prior to debridement (In Clinic Only). Wound #3 Left Calcaneus o Topical Lidocaine 4% cream applied to wound bed prior to debridement (In Clinic Only). Wound #4 Right,Distal Toe Second o Topical Lidocaine 4% cream applied to wound bed prior to debridement (In Clinic  Only). KAYSEA, RAYA ([id number]) Wound #5 Right,Distal Toe Great o Topical Lidocaine 4% cream applied to wound bed prior to debridement (In Clinic Only). Primary Wound Dressing Wound #2 Left,Lateral Malleolus o Other: - SNAP VAC with prisma to tunnel Wound #3 Left Calcaneus o Silver Collagen Wound #4 Right,Distal Toe Second o Silver Alginate - with mupirocin to wound bed Wound #5 Right,Distal Toe Great o Silver Alginate - with mupirocin to wound bed Secondary Dressing Wound #3 Left Calcaneus o Boardered Foam Dressing - Please keep a bordered foam dressing on the right malleolus with bactroban Wound #4 Right,Distal Toe Second o Conform/Kerlix Wound #5 Right,Distal Toe Great o Conform/Kerlix Dressing Change Frequency Wound #2 Left,Lateral Malleolus o Change Dressing Monday, Wednesday, Friday Wound #3 Left Calcaneus o Change Dressing Monday, Wednesday, Friday Wound #4 Right,Distal Toe Second o Change Dressing Monday, Wednesday, Friday Wound #5 Right,Distal Toe Great o Change Dressing Monday, Wednesday, Friday Follow-up Appointments Wound #2 Left,Lateral Malleolus o Return Appointment in 1 week. Wound #3 Left Calcaneus o Return Appointment  in 1 week. Wound #4 Right,Distal Toe Second o Return Appointment in 1 week. Wound #5 Right,Distal Toe Great o Return Appointment in 1 week. Edema Control Wound #2 Left,Lateral Malleolus Eisenbeis, [name] ([id number]) o Patient to wear own compression stockings o Patient to wear own Velcro compression garment. o Elevate legs to the level of the heart and pump ankles as often as possible Wound #3 Left Calcaneus o Patient to wear own compression stockings o Patient to wear own Velcro compression garment. o Elevate legs to the level of the heart and pump ankles as often as possible Wound #4 Right,Distal Toe Second o Patient to wear own compression stockings o Patient to wear own Velcro  compression garment. o Elevate legs to the level of the heart and pump ankles as often as possible Wound #5 Right,Distal Toe Great o Patient to wear own compression stockings o Patient to wear own Velcro compression garment. o Elevate legs to the level of the heart and pump ankles as often as possible Off-Loading Wound #2 Left,Lateral Malleolus o Turn and reposition every 2 hours o Other: - do not put pressure on your ankles Wound #3 Left Calcaneus o Turn and reposition every 2 hours o Other: - do not put pressure on your ankles Wound #4 Right,Distal Toe Second o Turn and reposition every 2 hours o Other: - do not put pressure on your ankles Wound #5 Right,Distal Toe Great o Turn and reposition every 2 hours o Other: - do not put pressure on your ankles Additional Orders / Instructions Wound #2 Left,Lateral Malleolus o Increase protein intake. o Increase protein intake. Wound #3 Left Calcaneus o Increase protein intake. o Increase protein intake. Wound #4 Right,Distal Toe Second o Increase protein intake. o Increase protein intake. Wound #5 Right,Distal Toe Great o Increase protein intake. o Increase protein intake. Home Health Wound #3 Left [place] Visits - [facility]. Please visit patient Monday, Wednesday, and Friday LAWREN, SEXSON ([id number]) o Home Health Nurse may visit PRN to address patientos wound care needs. o FACE TO FACE ENCOUNTER: MEDICARE and MEDICAID PATIENTS: I certify that this patient is under my care and that I had a face-to-face encounter that meets the physician face-to-face encounter requirements with this patient on this date. The encounter with the patient was in whole or in part for the following MEDICAL CONDITION: (primary reason for [place]) MEDICAL NECESSITY: I certify, that based on my findings, NURSING services are a medically necessary home health service. HOME  BOUND STATUS: I certify that my clinical findings support that this patient is homebound (i.e., Due to illness or injury, pt requires aid of supportive devices such as crutches, cane, wheelchairs, walkers, the use of special transportation or the assistance of another person to leave their place of residence. There is a normal inability to leave the home and doing so requires considerable and taxing effort. Other absences are for medical reasons / religious services and are infrequent or of short duration when for other reasons). o If current dressing causes regression in wound condition, may D/C ordered dressing product/s and apply Normal Saline Moist Dressing daily until next [place] / Other MD appointment. [place] of regression in wound condition at [phone]. o Please direct any NON-WOUND related issues/requests for orders to patient's Primary Care Physician Wound #4 Right,Distal Toe [place] Visits - [facility]. Please visit patient Monday, Wednesday, and Friday o Home Health Nurse may visit PRN to address  patientos wound care needs. o FACE TO FACE ENCOUNTER: MEDICARE and MEDICAID PATIENTS: I certify that this patient is under my care and that I had a face-to-face encounter that meets the physician face-to-face encounter requirements with this patient on this date. The encounter with the patient was in whole or in part for the following MEDICAL CONDITION: (primary reason for [place]) MEDICAL NECESSITY: I certify, that based on my findings, NURSING services are a medically necessary home health service. HOME BOUND STATUS: I certify that my clinical findings support that this patient is homebound (i.e., Due to illness or injury, pt requires aid of supportive devices such as crutches, cane, wheelchairs, walkers, the use of special transportation or the assistance of another person to leave their place of residence. There  is a normal inability to leave the home and doing so requires considerable and taxing effort. Other absences are for medical reasons / religious services and are infrequent or of short duration when for other reasons). o If current dressing causes regression in wound condition, may D/C ordered dressing product/s and apply Normal Saline Moist Dressing daily until next [place] / Other MD appointment. Jessie of regression in wound condition at [phone]. o Please direct any NON-WOUND related issues/requests for orders to patient's Primary Care Physician Wound #5 Right,Distal Toe [place] Visits - [facility]. Please visit patient Monday, Wednesday, and Friday o Home Health Nurse may visit PRN to address patientos wound care needs. o FACE TO FACE ENCOUNTER: MEDICARE and MEDICAID PATIENTS: I certify that this patient is under my care and that I had a face-to-face encounter that meets the physician face-to-face encounter requirements with this patient on this date. The encounter with the patient was in whole or in part for the following MEDICAL CONDITION: (primary reason for [place]) MEDICAL NECESSITY: I certify, that based on my findings, NURSING services are a medically necessary home health service. HOME BOUND STATUS: I certify that my clinical findings support that this patient is homebound (i.e., Due to illness or injury, pt requires aid of supportive devices such as crutches, cane, wheelchairs, walkers, the use of special transportation or the assistance of another person to leave their place of residence. There is a normal inability to leave the home and doing so requires considerable and taxing effort. Other absences are for medical reasons / religious services and are infrequent or of short duration when for other reasons). o If current dressing causes regression in wound condition, may D/C ordered dressing product/s and  apply Normal Saline Moist Dressing daily until next [place] / Other MD appointment. [place] of regression in wound condition at [phone]. o Please direct any NON-WOUND related issues/requests for orders to patient's Primary Care Physician Electronic Signature(s) Signed: 12/27/2018 3:43:47 PM By: Montey Hora Signed: 12/27/2018 5:23:14 PM By: Worthy Keeler PA-C Entered By: Montey Hora on 12/27/2018 11:57:59 Denes, [name] ([id number]) Schmieg, [name] ([id number]) -------------------------------------------------------------------------------- Problem List Details Patient Name: KIALEE, KHAM Date of Service: 12/27/2018 11:00 AM Medical Record Number: [id number] Patient Account Number: [id number] Date of Birth/Sex: [birth year] (85 y.o. 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 08/10/2017 No Yes ankle I70.243 Atherosclerosis of native arteries of left leg with ulceration of 07/10/2018 No Yes ankle I89.0 Lymphedema, not elsewhere classified 08/10/2017 No Yes B35.4 Tinea corporis 09/28/2017 No Yes 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, left ankle and foot 08/14/2018 No Yes Inactive Problems Resolved Problems AMELIE, CARACCI ([id number]) ICD-10 Code Description Active Date Resolved Date S90.822A Blister (nonthermal), left foot, initial encounter 10/11/2018 10/11/2018 Electronic  Signature(s) Signed: 12/27/2018 5:23:14 PM By: Worthy Keeler PA-C Entered By: Worthy Keeler on 12/27/2018 11:33:36 Portlock, [name] ([id number]) -------------------------------------------------------------------------------- Progress Note Details Patient Name: [name] Date of Service: 12/27/2018 11:00 AM Medical Record Number: [id number] Patient Account Number: [id number] Date of Birth/Sex: 09/18/1923 (85 y.o. 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 Dr. Amalia Hailey the podiatrist saw her on 07/20/2017 and sent her to the wound center for possible hyperbaric oxygen therapy. past medical history of peripheral vascular disease, varicose veins, status post appendectomy, basal cell carcinoma excision from the left leg, cholecystectomy, pacemaker placement, right lower extremity angiography done by Dr. dew in March 2017 with placement of a stent. there is also note of a successful ablation of the right small saphenous vein done which was reviewed by ultrasound on 10/24/2016. the patient had a right small saphenous vein ablation done on 10/20/2016. The patient has never been a smoker. She has been seen by Dr. Corene Cornea dew the vascular surgeon who most recently saw her on 06/15/2017 for evaluation of ongoing problems with right leg swelling. She had a lower extremity arterial duplex examination done(02/13/17) which showed patent distal right superficial  femoral artery stent and above-the-knee popliteal stent without evidence of restenosis. The ABI was more than 1.3 on the right and more than 1.3 on the left. This was consistent with noncompressible arteries due to medial calcification. The right great toe pressure and PPG waveforms are within normal limits and the left great toe pressure and PPG waveforms are decreased. he recommended she continue to wear her compression stockings and continue with elevation. She is scheduled to have a noninvasive arterial study in the near future 08/16/2017 -- had a lower extremity arterial duplex examination done which showed patent distal right superficial femoral artery stent and above-the-knee popliteal stent without evidence of restenosis. The ABI was more than 1.3 on the right and more than 1.3 on the left. This was consistent with noncompressible arteries due to medial calcification. The right great toe pressure and PPG waveforms are within normal limits and the left great toe pressure and PPG waveforms are decreased. the x-ray of the right ankle has not yet been done 08/24/2017 -- had a right ankle x-ray -- IMPRESSION:1. No fracture, bone lesion or evidence of osteomyelitis. 2. Lateral soft tissue swelling with a soft tissue ulcer. she has not yet seen the vascular surgeon for review 08/31/17 on evaluation today patient's wound appears to be showing signs of improvement. She still  with her appointment with vascular in order to review her results of her vascular study and then determine if any intervention would be recommended at that time. No fevers, chills, nausea, or vomiting noted at this time. She has been tolerating the dressing changes without complication. 09/28/17 on evaluation today patient's wound appears to show signs of good improvement in regard to the granulation tissue which is surfacing. There is still a layer of slough covering the wound and the posterior portion is still significantly  deeper than the anterior nonetheless there has been some good sign of things moving towards the better. She is going to go back to Dr. dew for reevaluation to ensure her blood flow is still appropriate. That will be before her next evaluation with [place] next week. No fevers, chills, nausea, or vomiting noted at this time. Patient does have some discomfort rated to be a 3-4/10 depending on activity specifically cleansing the wound makes it worse. 10/05/2017 -- the patient was seen by Dr. Lucky Cowboy last week and noninvasive studies showed a normal right ABI with brisk AARIN, SPARKMAN. ([id number]) triphasic waveforms consistent with no arterial insufficiency including normal digital pressures. The duplex showed a patent distal right SFA stent and the proximal SFA was also normal. He was pleased with her test and thought she should have enough of perfusion for normal wound healing. He would see her back in 6 months time. 12/21/17 on evaluation today patient appears to be doing fairly well in regard to her right lateral ankle wound. Unfortunately the main issue that she is expansion at this point is that she is having some issues with what appears to be some cellulitis in the right anterior shin. She has also been noting a little bit of uncomfortable feeling especially last night and her ankle area. I'm afraid that she made the developing a little bit of an infection. With that being said I think it is in the early stages. 12/28/17 on evaluation today patient's ankle appears to be doing excellent. She's making good progress at this point the cellulitis seems to have improved after last week's evaluation. Overall she is having no significant discomfort which is excellent news. She does have an appointment with Dr. dew on March 29, 2018 for reevaluation in regard to the stent he placed. She seems to have excellent blood flow in the right lower extremity. 01/19/12 on evaluation today patient's wound appears to be  doing very well. In fact she does not appear to require debridement at this point, there's no evidence of infection, and overall from the standpoint of the wound she seems to be doing very well. With that being said I believe that it may be time to switch to different dressing away from the [facility] Dressing she tells me she does have a lot going on her friend actually passed away yesterday and she's also having a lot of issues with her husband this obviously is weighing heavy on her as far as your thoughts and concerns today. 01/25/18 on evaluation today patient appears to be doing fairly well in regard to her right lateral malleolus. She has been tolerating the dressing changes without complication. Overall I feel like this is definitely showing signs of improvement as far as how the overall appearance of the wound is there's also evidence of epithelium start to migrate over the granulation tissue. In general I think that she is progressing nicely as far as the wound is concerned. The only concern she really has is whether  or not we can switch to every other week visits in order to avoid having as many appointments as her daughters have a difficult time getting her to her appointments as well as the patient's husband to his he is not doing very well at this point. 02/22/18 on evaluation today patient's right lateral malleolus ulcer appears to be doing great. She has been tolerating the dressing changes without complication. Overall you making excellent progress at this time. Patient is having no significant discomfort. 03/15/18 on evaluation today patient appears to be doing much more poorly in regard to her right lateral ankle ulcer at this point. Unfortunately since have last seen her her husband has passed just a few days ago is obviously weighed heavily on her her daughter also had surgery well she is with her today as usual. There does not appear to be any evidence of infection she does  seem to have significant contusion/deep tissue injury to the right lateral malleolus which was not noted previous when I saw her last. It's hard to tell of exactly when this injury occurred although during the time she was spending the night in the hospital this may have been most likely. 03/22/18 on evaluation today patient appears to actually be doing very well in regard to her ulcer. She did unfortunately have a setback which was noted last week however the good news is we seem to be getting back on track and in fact the wound in the core did still have some necrotic tissue which will be addressed at this point today but in general I'm seeing signs that things are on the up and up. She is glad to hear this obviously she's been somewhat concerned that due to the how her wound digressed more recently. 03/29/18 on evaluation today patient appears to be doing fairly well in regard to her right lower extremity lateral malleolus ulcer. She unfortunately does have a new area of pressure injury over the inferior portion where the wound has opened up a little bit larger secondary to the pressure she seems to be getting. She does tell me sometimes when she sleeps at night that it actually hurts and does seem to be pushing on the area little bit more unfortunately. There does not appear to be any evidence of infection which is good news. She has been tolerating the dressing changes without complication. She also did have some bruising in the left second and third toes due to the fact that she may have bump this or injured it although she has neuropathy so she does not feel she did move recently that may have been where this came from. Nonetheless there does not appear to be any evidence of infection at this time. 04/12/18 on evaluation today patient's wound on the right lateral ankle actually appears to be doing a little bit better with a lot of necrotic docking tissue centrally loosening up in clearing away.  However she does have the beginnings of a deep tissue injury on the left lateral malleolus likely due to the fact we've been trying offload the right as much as we have. I think she may benefit from an assistive soft device to help with offloading and it looks like they're looking at one of the doughnut conditions that wraps around the lower leg to offload which I think will definitely do a good job. With that being said I think we definitely need to address this issue on the left before it becomes a wound. Patient is not  having significant pain. LANDRY, LOOKINGBILL ([id number]) 04/19/18 on evaluation today patient appears to be doing excellent in regard to the progress she's made with her right lateral ankle ulcer. The left ankle region which did show evidence of a deep tissue injury seems to be resolving there's little fluid noted underneath and a blister there's nothing open at this point in time overall I feel like this is progressing nicely which is good news. She does not seem to be having significant discomfort at this point which is also good news. 04/25/18-She is here in follow up evaluation for bilateral lateral malleolar ulcers. The right lateral malleolus ulcer with pale subcutaneous tissue exposure, central area of ulcer with tendon/periosteum exposed. The left lateral malleolus ulcer now with central area of nonviable tissue, otherwise deep tissue injury. She is wearing compression wraps to the left lower extremity, she will place the right lower extremity compression wraps on when she gets home. She will be out of town over the weekend and return next week and follow-up appointment. She completed her doxycycline this morning 05/03/18 on evaluation today patient appears to be doing very well in regard to her right lateral ankle ulcer in general. At least she's showing some signs of improvement in this regard. Unfortunately she has some additional injury to the left lateral malleolus region  which appears to be new likely even over the past several days. Again this determination is based on the overall appearance. With that being said the patient is obviously frustrated about this currently. 05/10/18-She is here in follow-up evaluation for bilateral lateral malleolar ulcers. She states she has purchased offloading shoes/boots and they will arrive tomorrow. She was asked to bring them in the office at next week's appointment so her provider is aware of product being utilized. She continues to sleep on right or left side, she has been encouraged to sleep on her back. The right lateral malleolus ulcer is precariously close to peri-osteum; will order xray. The left lateral malleolus ulcer is improved. Will switch back to santyl; she will follow up next week. 05/17/18 on evaluation today patient actually appears to be doing very well in regard to her malleolus her ulcers compared to last time I saw them. She does not seem to have as much in the way of contusion at this point which is great news. With that being said she does continue to have discomfort and I do believe that she is still continuing to benefit from the offloading/pressure reducing boots that were recommended. I think this is the key to trying to get this to heal up completely. 05/24/18 on evaluation today patient actually appears to be doing worse at this point in time unfortunately compared to her last week's evaluation. She is having really no increased pain which is good news unfortunately she does have more maceration in your theme and noted surrounding the right lateral ankle the left lateral ankle is not really is erythematous I do not see signs of the overt cellulitis on that side. Unfortunately the wounds do not seem to have shown any signs of improvement since the last evaluation. She also has significant swelling especially on the right compared to previous some of this may be due to infection however also think that she  may be served better while she has these wounds by compression wrapping versus continuing to use the Juxta-Lite for the time being. Especially with the amount of drainage that she is experiencing at this point. No fevers, chills, nausea, or vomiting  noted at this time. 05/31/18 on evaluation today patient appears to actually be doing better in regard to her right lateral lower extremity ulcer specifically on the malleolus region. She has been tolerating the antibiotic without complication. With that being said she still continues to have issues but a little bit of redness although nothing like she what she was experiencing previous. She still continues to pressure to her ankle area she did get the problem on offloading boots unfortunately she will not wear them she states there too uncomfortable and she can't get in and out of the bed. Nonetheless at this point her wounds seem to be continually getting worse which is not what we want I'm getting somewhat concerned about her progress and how things are going to proceed if we do not intervene in some way shape or form. I therefore had a very lengthy conversation today about offloading yet again and even made a specific suggestion for switching her to a memory foam mattress and even gave the information for a specific one that they could look at getting if it was something that they were interested in considering. She does not want to be considered for a hospital bed air mattress although honestly insurance would not cover it that she does not have any wounds on her trunk. 06/14/18 on evaluation today both wounds over the bilateral lateral malleolus her ulcers appear to be doing better there's no evidence of pressure injury at this point. She did get the foam mattress for her bed and this does seem to have been extremely beneficial for her in my pinion. Her daughter states that she is having difficulty getting out of bed because of how soft it is. The  patient also relates this to be. Nonetheless I do feel like she's actually doing better. Unfortunately right after and around the time she was getting the mattress she also sustained a fall when she got up to go pick up the phone and ended up injuring her right elbow she has [address] sut[address]. We are not caring for this currently although home health is going to be taking the sutures out shortly. Nonetheless this may be something that we need to evaluate going forward. It depends on how well it has or has not healed in the end. She also recently saw an orthopedic specialist for an injection in the right shoulder just before her fall unfortunately the fall seems to have worsened her pain. 06/21/18 on evaluation today patient appears to be doing about the same in regard to her lateral malleolus ulcers. Both appear to be just a little bit deeper but again we are clinging away the necrotic and dead tissue which I think is why this is progressing towards a deeper realm as opposed to improving from my measurement standpoint in that regard. Nonetheless she has been tolerating the dressing changes she absolutely hates the memory foam mattress topper that was obtained for her nonetheless Kathya, Wilz [name] ([id number]) I do believe this is still doing excellent as far as taking care of excess pressure in regard to the lateral malleolus regions. She in fact has no pressure injury that I see whereas in weeks past it was week by week I was constantly seeing new pressure injuries. Overall I think it has been very beneficial for her. 07/03/18; patient arrives in my clinic today. She has deep punched out areas over her bilateral lateral malleoli. The area on the right has some more depth. We spent a lot of  time today talking about pressure relief for these areas. This started when her daughter asked for a prescription for a memory foam mattress. I have never written a prescription for a mattress and I don't think  insurances would pay for that on an ordinary bed. In any case he came up that she has foam boots that she refuses to wear. I would suggest going to these before any other offloading issues when she is in bed. They say she is meticulous about offloading this the rest of the day 07/10/18- She is seen in follow-up evaluation for bilateral, lateral malleolus ulcers. There is no improvement in the ulcers. She has purchased and is sleeping on a memory foam mattress/overlay, she has been using the offloading boots nightly over the past week. She has a follow up appointment with vascular medicine at the end of October, in my opinion this follow up should be expedited given her deterioration and suboptimal TBI results. We will order plain film xray of the left ankle as deeper structures are palpable; would consider having MRI, regardless of xray report(s). The ulcers will be treated with iodoflex/iodosorb, she is unable to safely change the dressings daily with santyl. 07/19/18 on evaluation today patient appears to be doing in general visually well in regard to her bilateral lateral malleolus ulcers. She has been tolerating the dressing changes without complication which is good news. With that being said we did have an x-ray performed on 07/12/18 which revealed a slight loosen see in the lateral portion of the distal left fibula which may represent artifact but underline lytic destruction or osteomyelitis could not be excluded. MRI was recommended. With that being said we can see about getting the patient scheduled for an MRI to further evaluate this area. In fact we have that scheduled currently for August 20 19,019. 07/26/18 on evaluation today patient's wound on the right lateral ankle actually appears to be doing fairly well at this point in my pinion. She has made some good progress currently. With that being said unfortunately in regard to the left lateral ankle ulcer this seems to be a little bit more  problematic at this time. In fact as I further evaluated the situation she actually had bone exposed which is the first time that's been the case in the bone appear to be necrotic. Currently I did review patient's note from Dr. Bunnie Domino office with [facility]. He stated that ABI was 1.26 on the right and 0.95 on the left with good waveforms. Her perfusion is stable not reduced from previous studies and her digital waveforms were pretty good particularly on the right. His conclusion upon review of the note was that there was not much she could do to improve her perfusion and he felt she was adequate for wound healing. His suggestion was that she continued to see [place] and consider a synthetic skin graft if there was no underlying infection. He plans to see her back in six months or as needed. 08/01/18 on evaluation today patient appears to be doing better in regard to her right lateral ankle ulcer. Her left lateral ankle ulcer is about the same she still has bone involvement in evidence of necrosis. There does not appear to be evidence of infection at this time On the right lateral lower extremity. I have started her on the Augmentin she picked this up and started this yesterday. This is to get her through until she sees infectious disease which is scheduled for 08/12/18. 08/06/18  on evaluation today patient appears to be doing rather well considering my discussion with patient's daughter at the end of last week. The area which was marked where she had erythema seems to be improved and this is good news. With that being said overall the patient seems to be making good improvement when it comes to the overall appearance of the right lateral ankle ulcer although this has been slow she at least is coming around in this regard. Unfortunately in regard to the left lateral ankle ulcer this is osteomyelitis based on the pathology report as well is bone culture. Nonetheless we are still waiting  CT scan. Unfortunately the MRI we originally ordered cannot be performed as the patient is a pacemaker which I had overlooked. Nonetheless we are working on the CT scan approval and scheduling as of now. She did go to the hospital over the weekend and was placed on IV Cefzo for a couple of days. Fortunately this seems to have improved the erythema quite significantly which is good news. There does not appear to be any evidence of worsening infection at this time. She did have some bleeding after the last debridement therefore I did not perform any sharp debridement in regard to left lateral ankle at this point. Patient has been approved for a snap vac for the right lateral ankle. 08/14/18; the patient with wounds over her bilateral lateral malleoli. The area on the right actually looks quite good. Been using a snap back on this area. Healthy granulation and appears to be filling in. Unfortunately the area on the left is really problematic. 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 Also noted to have cellulitis. She saw Dr. Novella Olive of infectious disease today and was put on linezolid. We are able to verify this with her pharmacy. She is completed the Augmentin that she was already on. We've been using Iodoflex to this MADASYN, HEATH ([id number]) area 08/23/18 on evaluation today patient's wounds both actually appear to be doing better compared to my prior evaluations. Fortunately she showing signs of good improvement in regard to the overall wound status especially where were using the snap vac on the right. In regard to left lateral malleolus the wound bed actually appears to be much cleaner than previously noted. I do not feel any phone directly probed during evaluation today and though there is tendon noted this does not appear to be necrotic it's actually fairly good as far as the overall appearance of the tendon  is concerned. In general the wound bed actually appears to be doing significantly better than it was previous. Patient is currently in the care of [name] and I did review that note today. He actually has her on two weeks of linezolid and then following the patient will be on 1-2 months of Keflex. That is the plan currently. She has been on antibiotics therapy as prescribed by myself initially starting on July 30, 2018 and has been on that continuously up to this point. 08/30/18 on evaluation today patient actually appears to be doing much better in regard to her right lateral malleolus ulcer. She has been tolerating the dressing changes specifically the snap vac without complication although she did have some issues with the seal currently. Apparently there was some trouble with getting it to maintain over the past week past Sunday. Nonetheless overall the wound appears better in regard to the right lateral malleolus region.  In regard to left lateral malleolus this actually show some signs of additional granulation although there still tendon noted in the base of the wound this appears to be healthy not necrotic in any way whatsoever. We are considering potentially using a snap vac for the left lateral malleolus as well the product wrap from [employer], [place], was present in the clinic today we're going to see this patient I did have her come in with me after obtaining consent from the patient and her daughter in order to look at the wound and see if there's any recommendation one way or another as to whether or not they felt the snapback could be beneficial for the left lateral malleolus region. But the conclusion was that it might be but that this is definitely a little bit deeper wound than what traditionally would be utilized for a snap vac. 09/06/18 on evaluation today patient actually appears to be doing excellent in my pinion in regard to both ankle ulcers. She has been tolerating the dressing  changes without complication which is great news. Specifically we have been using the snap vac. In regard to the right ankle I'm not even sure that this is going to be necessary for today and following as the wound has filled in quite nicely. In regard to the left ankle I do believe that we're seeing excellent epithelialization from the edge as well as granulation in the central portion the tendon is still exposed but there's no evidence of necrotic bone and in general I feel like the patient has made excellent progress even compared to last week with just one week of the snap vac. 09/11/18; this is a patient who has wounds on her bilateral lateral malleoli. Initially both of these were deep stage IV wounds in the setting of chronic arterial insufficiency. She has been revascularized. As I understand think she been using snap vacs to both of these wounds however the area on the right became more superficial and currently she is only using it on the left. Using silver collagen on the right and silver collagen under the back on the left I believe 09/19/18 on evaluation today patient actually appears to be doing very well in regard to her lateral malleolus or ulcers bilaterally. She has been tolerating the dressing changes without complication. Fortunately there does not appear to be any evidence of infection at this time. Overall I feel like she is improving in an excellent manner and I'm very pleased with the fact that everything seems to be turning towards the better for her. This has obviously been a long road. 09/27/18 on evaluation today patient actually appears to be doing very well in regard to her bilateral lateral malleolus ulcers. She has been tolerating the dressing changes without complication. Fortunately there does not appear to be any evidence of infection at this time which is also great news. No fevers, chills, nausea, or vomiting noted at this time. Overall I feel like she is doing  excellent with the snap vac on the left malleolus. She had 40 mL of fluid collection over the past week. 10/04/18 on evaluation today patient actually appears to be doing well in regard to her bilateral lateral malleolus ulcers. She continues to tolerate the dressing changes without complication. One issue that I see is the snap vac on the left lateral malleolus which appears to have sealed off some fluid underlying this area and has not really allowed it to heal to the degree that I would like to  see. For that reason I did suggest at this point we may want to pack a small piece of packing strip into this region to allow it to more effectively wick out fluid. 10/11/18 in general the patient today does not feel that she has been doing very well. She's been a little bit lethargic and subsequently is having bodyaches as well according to what she tells me today. With that being said overall she has been concerned with the fact that something may be worsening although to be honest her wounds really have not been appearing poorly. She does have a new ulcer on her left heel unfortunately. This may be pressure related. Nonetheless it seems to me to have potentially started at least as a blister I do not see any evidence of deep tissue injury. In regard to the left ankle the snap vac still seems to be causing the ceiling off of the deeper part of the wound which is in turn trapping fluid. I'm not extremely pleased with the overall appearance as far as progress from last week to this week therefore I'm gonna discontinue Flaugher, [name] ([id number]) the snap vac at this point. 10/18/18 patient unfortunately this point has not been feeling well for the past several days. She was seen by Grayland Ormond her primary care provider who is a Librarian, academic at [facility]. Subsequently she states that she's been very weak and generally feeling malaise. No fevers, chills, nausea, or vomiting noted at this  time. With that being said bloodwork was performed at the PCP office on the 11th of this month which showed a white blood cell count of 10.7. This was repeated today and shows a white blood cell count of 12.4. This does show signs of worsening. Coupled with the fact that she is feeling worse and that her left ankle wound is not really showing signs of improvement I feel like this is an indication that the osteomyelitis is likely exacerbating not improving. Overall I think we may also want to check her C-reactive protein and sedimentation rate. Actually did call [facility] office this afternoon while the patient was in the office here with me. Subsequently based on the findings we discussed treatment possibilities and I think that it is appropriate for [place] to go ahead and initiate treatment with doxycycline which I'm going to do. Subsequently he did agree to see about adding a CRP and sedimentation rate to her orders. If that has not already been drawn to where they can run it they will contact the patient she can come back to have that check. They are in agreement with plan as far as the patient and her daughter are concerned. Nonetheless also think we need to get in touch with Dr. Henreitta Leber office to see about getting the patient scheduled with him as soon as possible. 11/08/18 on evaluation today patient presents for follow-up concerning her bilateral foot and ankle ulcers. I did do an extensive review of her chart in epic today. Subsequently she was seen by [name] he did initiate Cefepime IV antibiotic therapy. Subsequently she had some issues with her PICC line this had to be removed because it was coiled and then replaced. Fortunately that was now settled. Unfortunately she has continued have issues with her left heel as well as the issues that she is experiencing with her bilateral lateral malleolus regions. I do believe however both areas seem to be doing a little bit better on  evaluation today which is good news.  No fevers, chills, nausea, or vomiting noted at this time. She actually has an angiogram schedule with Dr. dew on this coming Monday, November 11, 2018. Subsequently the patient states that she is feeling much better especially than what she was roughly 2 weeks ago. She actually had to cancel an appointment because she was feeling so poorly. No fevers, chills, nausea, or vomiting noted at this time. 11/15/18 on evaluation today patient actually is status post having had her angiogram with Dr. dew Monday, four days ago. 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 He had to go and work on several areas of the vasculature fortunately he was able to obtain no more than a 30% residual stenosis throughout post procedure. I reviewed this note today. I think this will definitely help with healing at this time. Fortunately there does not appear to be any signs of infection and I do feel like ratio already has a better appearance to it. 11/22/18 upon evaluation today patient actually appears to be doing very well in regard to her wounds in general. The right lateral malleolus looks excellent the heel looks better in the left lateral malleolus also appears to be doing a little better. With that being said the right second toe actually appears to be open and training we been watching this is been dry and stable but now is open. 12/03/2018 Seen today for follow-up and management of multiple bilateral lower extremity wounds. New pressure injury of the great toe which is closed at this time. Wound of the right distal second toe appears larger today with deep undermining and a pocket of fluid present within the undermining region. Left and right malleolus is wounds are stable today with no signs and symptoms of infection.Denies any needs or concerns during exam today. 12/13/18 on evaluation today patient appears to be doing somewhat better in regard to her  left heel ulcer. She also seems to be completely healed in regard to the right lateral malleolus ulcer. The left malleolus ulcer is smaller what unfortunately the wounds which are new over the first and second toes of the right foot are what are most concerning at this point especially the second. Both areas did require sharp debridement today. 12/20/18 on evaluation today patient's wound actually appears to be doing better in regard to left lateral ankle and her right lateral ankle continues to remain healed. The hill ulcer on the left is improved. She does have improvement noted as well in regard to both toe ulcers. Overall I'm very pleased in this regard. No fevers, chills, nausea, or vomiting noted at this time. 12/23/18 on evaluation today patient is seen after she had her toenails trimmed at the podiatrist office due to issues with her right great toe. There was what appeared to be dark eschar on the surface of the wound which had her in the podiatrist concerned. Nonetheless as I remember that during the last office visit I had utilize silver nitrate of this area I was much less concerned about the situation. Subsequently I was able to clean off much of this tissue without any complication today. This does not appear to show any signs of infection and actually look somewhat better compared to last time post debridement. Her second toe on the right foot actually had callous over and there did appear still be some fluid underneath this that would require debridement today. JAQUELYNN, WANAMAKER ([id number]) 12/27/18 on evaluation today patient actually appears to be showing  signs of improvement at all locations. Even the left lateral ankle although this is not quite as great as the other sites. Fortunately there does not appear to be any signs of infection at this time and both of her toes on the right foot seem to be showing signs of improvement which is good news and very pleased in this  regard. Patient History Information obtained from Patient. Family History Cancer - Father,Siblings, Heart Disease - Siblings, No family history of Diabetes, Hereditary Spherocytosis, Hypertension, Kidney Disease, Lung Disease, Seizures, Stroke, Thyroid Problems, Tuberculosis. Social History Never smoker, Marital Status - Married, Alcohol Use - Never, Drug Use - No History, Caffeine Use - Rarely. Review of Systems (ROS) Constitutional Symptoms (General Health) Denies complaints or symptoms of Fever, Chills. Respiratory The patient has no complaints or symptoms. Cardiovascular Complains or has symptoms of LE edema. Psychiatric The patient has no complaints or symptoms. Objective Constitutional Well-nourished and well-hydrated in no acute distress. Vitals Time Taken: 11:14 AM, Height: 65 in, Weight: 154.3 lbs, BMI: 25.7, Temperature: 98.1 F, Pulse: 89 bpm, Respiratory Rate: 18 breaths/min, Blood Pressure: 116/52 mmHg, Pulse Oximetry: 98 %. Respiratory normal breathing without difficulty. clear to auscultation bilaterally. Cardiovascular regular rate and rhythm with normal S1, S2. Psychiatric this patient is able to make decisions and demonstrates good insight into disease process. Alert and Oriented x 3. pleasant and cooperative. General Notes: Patient's wounds all seem to show signs of good improvement the one difference is that she does seem to be having a harder time getting the left lateral ankle to heal compared to everything else. She also has a little bit more swelling today in the left leg compared to normal although she has been taken off some of her Lasix. With all that being said I do believe that she overall is shown signs of improvement. LASHUNDA, GREIS ([id number]) Integumentary (Hair, Skin) Wound #2 status is Open. Original cause of wound was Gradually Appeared. The wound is located on the Left,Lateral Malleolus. The wound measures 0.8cm length x 0.7cm width x  0.4cm depth; 0.44cm^2 area and 0.176cm^3 volume. There is Fat Layer (Subcutaneous Tissue) Exposed exposed. There is no tunneling or undermining noted. There is a medium amount of serous drainage noted. The wound margin is epibole. There is large (67-100%) pink granulation within the wound bed. There is a small (1-33%) amount of necrotic tissue within the wound bed including Eschar. The periwound skin appearance exhibited: Maceration, Hemosiderin Staining. The periwound skin appearance did not exhibit: Callus, Crepitus, Excoriation, Induration, Rash, Scarring, Dry/Scaly, Atrophie Blanche, Cyanosis, Ecchymosis, Mottled, Pallor, Rubor, Erythema. Periwound temperature was noted as No Abnormality. The periwound has tenderness on palpation. Wound #3 status is Open. Original cause of wound was Gradually Appeared. The wound is located on the Left Calcaneus. The wound measures 1.4cm length x 1.6cm width x 0.1cm depth; 1.759cm^2 area and 0.176cm^3 volume. There is Fat Layer (Subcutaneous Tissue) Exposed exposed. There is no tunneling or undermining noted. There is a small amount of serous drainage noted. The wound margin is indistinct and nonvisible. There is small (1-33%) hyper - granulation within the wound bed. There is no necrotic tissue within the wound bed. The periwound skin appearance exhibited: Maceration. The periwound skin appearance did not exhibit: Callus, Crepitus, Excoriation, Induration, Rash, Scarring, Dry/Scaly, Atrophie Blanche, Cyanosis, Ecchymosis, Hemosiderin Staining, Mottled, Pallor, Rubor, Erythema. Periwound temperature was noted as No Abnormality. The periwound has tenderness on palpation. Wound #4 status is Open. Original cause of wound was Pressure Injury. The wound  is located on the Right,Distal Toe Second. The wound measures 0.1cm length x 0.1cm width x 0.1cm depth; 0.008cm^2 area and 0.001cm^3 volume. There is Fat Layer (Subcutaneous Tissue) Exposed exposed. There is no  tunneling or undermining noted. There is a none present amount of drainage noted. There is no granulation within the wound bed. There is a large (67-100%) amount of necrotic tissue within the wound bed including Eschar. The periwound skin appearance did not exhibit: Callus, Crepitus, Excoriation, Induration, Rash, Scarring, Dry/Scaly, Maceration, Atrophie Blanche, Cyanosis, Ecchymosis, Hemosiderin Staining, Mottled, Pallor, Rubor, Erythema. Wound #5 status is Open. Original cause of wound was Footwear Injury. The wound is located on the Right,Distal [employer]. The wound measures 0.4cm length x 0.4cm width x 0.1cm depth; 0.126cm^2 area and 0.013cm^3 volume. There is Fat Layer (Subcutaneous Tissue) Exposed exposed. There is no tunneling or undermining noted. There is a none present amount of drainage noted. There is no granulation within the wound bed. There is a small (1-33%) amount of necrotic tissue within the wound bed including Adherent Slough. 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 There was a Excisional Skin/Subcutaneous Tissue Debridement with a total area of 0.01 sq cm performed by STONE III, HOYT E., PA-C. With the following instrument(s): Curette to remove Viable and Non-Viable tissue/material. Material removed includes Callus, Subcutaneous Tissue, and Slough after achieving pain control using Lidocaine 4% Topical Solution. No specimens  were taken. A time out was conducted at 11:50, prior to the start of the procedure. A Minimum amount of bleeding was controlled with Pressure. The procedure was tolerated well with a pain level of 0 throughout and a pain level of 0 following the procedure. Post Debridement Measurements: 0.5cm length x 0.5cm width x 0.1cm depth; 0.02cm^3 volume. Post debridement Stage noted as Category/Stage II. Character of Wound/Ulcer Post Debridement is improved. Post procedure Diagnosis Wound #4: Same as Pre-Procedure Wound #5 Pre-procedure diagnosis of Wound #5 is a Pressure Ulcer located on the Right,Distal Toe Great . There was a Excisional Skin/Subcutaneous Tissue Debridement with a total area of 0.16 sq cm performed by STONE III, HOYT E., PA-C. With the following instrument(s): Curette to remove Viable and Non-Viable tissue/material. Material removed includes Callus, Subcutaneous Tissue, and Slough after achieving pain control using Lidocaine 4% Topical Solution. No specimens were taken. A time out was conducted at 11:48, prior to the start of the procedure. A Minimum amount of bleeding was controlled with Pressure. The procedure was tolerated well with a pain level of 0 throughout and a pain level of 0 following the procedure. Post Debridement Measurements: 0.4cm length x 0.5cm width x 0.5cm depth; 0.079cm^3 volume. Post debridement Stage noted as Category/Stage II. Character of Wound/Ulcer Post Debridement is improved. Post procedure Diagnosis Wound #5: Same as Pre-Procedure Plan Wound Cleansing: Wound #2 Left,Lateral Malleolus: Clean wound with Normal Saline. Cleanse wound with mild soap and water May Shower, gently pat wound dry prior to applying new dressing. Wound #3 Left Calcaneus: Clean wound with Normal Saline. Cleanse wound with mild soap  and water May Shower, gently pat wound dry prior to applying new dressing. Wound #4 Right,Distal Toe Second: Clean wound with Normal Saline. Cleanse  wound with mild soap and water May Shower, gently pat wound dry prior to applying new dressing. Wound #5 Right,Distal Toe Great: Clean wound with Normal Saline. Cleanse wound with mild soap and water May Shower, gently pat wound dry prior to applying new dressing. Anesthetic (add to Medication List): Wound #2 Left,Lateral Malleolus: Topical Lidocaine 4% cream applied to wound bed prior to debridement (In Clinic Only). Wound #3 Left Calcaneus: Topical Lidocaine 4% cream applied to wound bed prior to debridement (In Clinic Only). Wound #4 Right,Distal Toe Second: Topical Lidocaine 4% cream applied to wound bed prior to debridement (In Clinic Only). Wound #5 Right,Distal Toe Great: Topical Lidocaine 4% cream applied to wound bed prior to debridement (In Clinic Only). YULEIMY, KRETZ ([id number]) Primary Wound Dressing: Wound #2 Left,Lateral Malleolus: Other: - SNAP VAC with prisma to tunnel Wound #3 Left Calcaneus: Silver Collagen Wound #4 Right,Distal Toe Second: Silver Alginate - with mupirocin to wound bed Wound #5 Right,Distal Toe Great: Silver Alginate - with mupirocin to wound bed Secondary Dressing: Wound #3 Left Calcaneus: Boardered Foam Dressing - Please keep a bordered foam dressing on the right malleolus with bactroban Wound #4 Right,Distal Toe Second: Conform/Kerlix Wound #5 Right,Distal Toe Great: Conform/Kerlix Dressing Change Frequency: Wound #2 Left,Lateral Malleolus: Change Dressing Monday, Wednesday, Friday Wound #3 Left Calcaneus: Change Dressing Monday, Wednesday, Friday Wound #4 Right,Distal Toe Second: Change Dressing Monday, Wednesday, Friday Wound #5 Right,Distal Toe Great: Change Dressing Monday, Wednesday, Friday Follow-up Appointments: Wound #2 Left,Lateral Malleolus: Return Appointment in 1 week. Wound #3 Left Calcaneus: Return Appointment in 1 week. Wound #4 Right,Distal Toe Second: Return Appointment in 1 week. Wound #5 Right,Distal Toe  Great: Return Appointment in 1 week. Edema Control: Wound #2 Left,Lateral Malleolus: Patient to wear own compression stockings Patient to wear own Velcro compression garment. Elevate legs to the level of the heart and pump ankles as often as possible Wound #3 Left Calcaneus: Patient to wear own compression stockings Patient to wear own Velcro compression garment. Elevate legs to the level of the heart and pump ankles as often as possible Wound #4 Right,Distal Toe Second: Patient to wear own compression stockings Patient to wear own Velcro compression garment. Elevate legs to the level of the heart and pump ankles as often as possible Wound #5 Right,Distal Toe Great: Patient to wear own compression stockings Patient to wear own Velcro compression garment. Elevate legs to the level of the heart and pump ankles as often as possible Off-Loading: Wound #2 Left,Lateral Malleolus: Turn and reposition every 2 hours Other: - do not put pressure on your ankles Wound #3 Left Calcaneus: Turn and reposition every 2 hours Other: - do not put pressure on your ankles Wound #4 Right,Distal Toe Second: Turn and reposition every 2 hours Dancel, [name] ([id number]) Other: - do not put pressure on your ankles Wound #5 Right,Distal Toe Great: Turn and reposition every 2 hours Other: - do not put pressure on your ankles Additional Orders / Instructions: Wound #2 Left,Lateral Malleolus: Increase protein intake. Increase protein intake. Wound #3 Left Calcaneus: Increase protein intake. Increase protein intake. Wound #4 Right,Distal Toe Second: Increase protein intake. Increase protein intake. Wound #5 Right,Distal Toe Great: Increase protein intake. Increase protein intake. Home Health: Wound #3 Left Calcaneus: Continue Home Health Visits - [facility]. Please visit patient Monday, Wednesday, and Friday [place] Nurse may  visit PRN to address patient s wound care needs. FACE TO FACE  ENCOUNTER: MEDICARE and MEDICAID PATIENTS: I certify that this patient is under my care and that I had a face-to-face encounter that meets the physician face-to-face encounter requirements with this patient on this date. The encounter with the patient was in whole or in part for the following MEDICAL CONDITION: (primary reason for [place]) MEDICAL NECESSITY: I certify, that based on my findings, NURSING services are a medically necessary home health service. HOME BOUND STATUS: I certify that my clinical findings support that this patient is homebound (i.e., Due to illness or injury, pt requires aid of supportive devices such as crutches, cane, wheelchairs, walkers, the use of special transportation or the assistance of another person to leave their place of residence. There is a normal inability to leave the home and doing so requires considerable and taxing effort. Other absences are for medical reasons / religious services and are infrequent or of short duration when for other reasons). If current dressing causes regression in wound condition, may D/C ordered dressing product/s and apply Normal Saline Moist Dressing daily until next [place] / Other MD appointment. [place] of regression in wound condition at [phone]. Please direct any NON-WOUND related issues/requests for orders to patient's Primary Care Physician Wound #4 Right,Distal Toe Second: [place] Visits - [facility]. Please visit patient Monday, Wednesday, and Friday Home Health Nurse may visit PRN to address patient s wound care needs. FACE TO FACE ENCOUNTER: MEDICARE and MEDICAID PATIENTS: I certify that this patient is under my care and that I had a face-to-face encounter that meets the physician face-to-face encounter requirements with this patient on this date. The encounter with the patient was in whole or in part for the following MEDICAL CONDITION: (primary reason for  [place]) MEDICAL NECESSITY: I certify, that based on my findings, NURSING services are a medically necessary home health service. HOME BOUND STATUS: I certify that my clinical findings support that this patient is homebound (i.e., Due to illness or injury, pt requires aid of supportive devices such as crutches, cane, wheelchairs, walkers, the use of special transportation or the assistance of another person to leave their place of residence. There is a normal inability to leave the home and doing so requires considerable and taxing effort. Other absences are for medical reasons / religious services and are infrequent or of short duration when for other reasons). If current dressing causes regression in wound condition, may D/C ordered dressing product/s and apply Normal Saline Moist Dressing daily until next [place] / Other MD appointment. [place] of regression in wound condition at [phone]. Please direct any NON-WOUND related issues/requests for orders to patient's Primary Care Physician Wound #5 Right,Distal Toe Great: [place] Visits - [facility]. Please visit patient Monday, Wednesday, and Friday Home Health Nurse may visit PRN to address patient s wound care needs. FACE TO FACE ENCOUNTER: MEDICARE and MEDICAID PATIENTS: I certify that this patient is under my care and that I had a face-to-face encounter that meets the physician face-to-face encounter requirements with this patient on this date. The encounter with the patient was in whole or in part for the following MEDICAL CONDITION: (primary reason for [place]) MEDICAL NECESSITY: I certify, that based on my findings, NURSING services are a medically necessary home health service. HOME BOUND STATUS: I certify that my clinical findings support that this patient is homebound (i.e., Due to illness  or injury, pt requires aid of supportive devices such as crutches, cane, wheelchairs,  walkers, the use of special transportation or the assistance of another person to leave their place of residence. There is a normal inability to leave the [place], [place]. ([id number]) home and doing so requires considerable and taxing effort. Other absences are for medical reasons / religious services and are infrequent or of short duration when for other reasons). If current dressing causes regression in wound condition, may D/C ordered dressing product/s and apply Normal Saline Moist Dressing daily until next [place] / Other MD appointment. [place] of regression in wound condition at [phone]. Please direct any NON-WOUND related issues/requests for orders to patient's Primary Care Physician My suggestion at this point is going to be that we go ahead and continue with the above wound care measures. The mupirocin does seem to be helping we will use this on both for toes and continue the Prisma on the heel as well as his the Snap Vac on the left ankle. Unless there is dramatic improvement next week in regard to the ankle I think we may just go back to packing the region we will see. If anything changes she will let me know. Please see above for specific wound care orders. We will see patient for re-evaluation in 1 week(s) here in the clinic. If anything worsens or changes patient will contact our office for additional recommendations. Electronic Signature(s) Signed: 12/27/2018 5:23:14 PM By: Worthy Keeler PA-C Entered By: Worthy Keeler on 12/27/2018 17:07:12 Ricci, [name] ([id number]) -------------------------------------------------------------------------------- ROS/PFSH Details Patient Name: [name] Date of Service: 12/27/2018 11:00 AM Medical Record Number: [id number] Patient Account Number: [id number] Date of Birth/Sex: 09-27-1923 (85 y.o. F) Treating RN: Montey Hora Primary Care Provider: Grayland Ormond Other  Clinician: Referring Provider: Grayland Ormond Treating Provider/Extender: Melburn Hake, HOYT Weeks in Treatment: 57 Information Obtained From Patient Wound History Do you currently have one or more open woundso Yes How many open wounds do you currently haveo 1 Approximately how long have you had your woundso 2 yrs How have you been treating your wound(s) until nowo mupirocin, soaking in epsom salt Has your wound(s) ever healed and then re-openedo No Have you had any lab work done in the past montho No Have you tested positive for an antibiotic resistant organism (MRSA, VRE)o No Have you tested positive for osteomyelitis (bone infection)o No Have you had any tests for circulation on your legso Yes Who ordered the testo Dr. Lucky Cowboy [facility] test [facility] Constitutional Symptoms (General Health) Complaints and Symptoms: Negative for: Fever; Chills Cardiovascular Complaints and Symptoms: Positive for: LE edema Medical History: Positive for: Congestive Heart Failure; Hypertension Eyes Medical History: Positive for: Cataracts - surgery Respiratory Complaints and Symptoms: No Complaints or Symptoms Musculoskeletal Medical History: Positive for: Osteoarthritis Neurologic Medical History: Positive for: Neuropathy [place], [place]oz[place] [name] ([id number]) Oncologic Medical History: Negative for: Received Chemotherapy; Received Radiation Psychiatric Complaints and Symptoms: No Complaints or Symptoms HBO Extended History Items Eyes: Cataracts Immunizations Pneumococcal Vaccine: Received Pneumococcal Vaccination: Yes Implantable Devices Family and Social History Cancer: Yes - Father,Siblings; Diabetes: No; Heart Disease: Yes - Siblings; Hereditary Spherocytosis: No; Hypertension: No; Kidney Disease: No; Lung Disease: No; Seizures: No; Stroke: No; Thyroid Problems: No; Tuberculosis: No; Never smoker; Marital Status - Married; Alcohol Use: Never; Drug Use: No History; Caffeine Use:  Rarely; Financial Concerns: No; Food, Clothing or Shelter Needs: No; Support System Lacking: No; Transportation Concerns: No; Advanced Directives:  No; Patient does not want information on Advanced Directives; Do not resuscitate: No; Living Will: Yes (Not Provided); Medical Power of Attorney: No Physician Affirmation I have reviewed and agree with the above information. Electronic Signature(s) Signed: 12/27/2018 5:23:14 PM By: Worthy Keeler PA-C Signed: 12/30/2018 4:38:40 PM By: Montey Hora Entered By: Worthy Keeler on 12/27/2018 17:02:48 [place], [name] ([id number]) -------------------------------------------------------------------------------- SuperBill Details Patient Name: [name] Date of Service: 12/27/2018 Medical Record Number: [id number] Patient Account Number: [id number] Date of Birth/Sex: October 30, 1923 (85 y.o. 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 I70.243 Atherosclerosis of native arteries of left leg with ulceration of ankle I89.0 Lymphedema, not elsewhere classified B35.4 Tinea corporis 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

## 2019-01-03 ENCOUNTER — Encounter: Payer: Medicare Other | Admitting: Physician Assistant

## 2019-01-03 DIAGNOSIS — L89514 Pressure ulcer of right ankle, stage 4: Secondary | ICD-10-CM | POA: Diagnosis not present

## 2019-01-05 NOTE — Progress Notes (Signed)
ADITI, ROVIRA ([id number]) Visit Report for 01/03/2019 Chief Complaint Document Details Patient Name: [name], [name]. Date of Service: 01/03/2019 10:30 AM Medical Record Number: [id number] Patient Account Number: [id number] Date of Birth/Sex: 10-05-1923 (85 y.o. F) Treating RN: Montey Hora Primary Care Provider: Grayland Ormond Other Clinician: Referring Provider: Grayland Ormond Treating Provider/Extender: Melburn Hake, Jennell Janosik Weeks in Treatment: 65 Information Obtained from: Patient Chief Complaint Patient is here for right lateral malleolus, right 2nd distal toe and left lateral malleolus ulcer Electronic Signature(s) Signed: 01/03/2019 5:12:31 PM By: Worthy Keeler PA-C Entered By: Worthy Keeler on 01/03/2019 10:40:40 [name], [name] ([id number]) -------------------------------------------------------------------------------- Debridement Details Patient Name: [name] Date of Service: 01/03/2019 10:30 AM Medical Record Number: [id number] Patient Account Number: [id number] Date of Birth/Sex: [birth year] (85 y.o. F) Treating RN: Montey Hora Primary Care Provider: Grayland Ormond Other Clinician: Referring Provider: Grayland Ormond Treating Provider/Extender: Melburn Hake, Levin Dagostino Weeks in Treatment: 52 Debridement Performed for Wound #4 Right,Distal Toe Second Assessment: Performed By: Physician STONE III, Kymorah Korf E., PA-C Debridement Type: Debridement Level of Consciousness (Pre- Awake and Alert procedure): Pre-procedure Verification/Time Yes - 11:25 Out Taken: Start Time: 11:25 Pain Control: Lidocaine 4% Topical Solution Total Area Debrided (L x W): 0.1 (cm) x 0.1 (cm) = 0.01 (cm) Tissue and other material Viable, Non-Viable, Callus, Slough, Subcutaneous, Slough debrided: Level: Skin/Subcutaneous Tissue Debridement Description: Excisional Instrument: Curette Bleeding: Minimum Hemostasis Achieved: Pressure End Time: 11:29 Procedural Pain: 0 Post  Procedural Pain: 0 Response to Treatment: Procedure was tolerated well Level of Consciousness Awake and Alert (Post-procedure): Post Debridement Measurements of Total Wound Length: (cm) 0.3 Stage: Category/Stage II Width: (cm) 0.4 Depth: (cm) 0.3 Volume: (cm) 0.028 Character of Wound/Ulcer Post Improved Debridement: Post Procedure Diagnosis Same as Pre-procedure Electronic Signature(s) Signed: 01/03/2019 5:12:31 PM By: Worthy Keeler PA-C Signed: 01/03/2019 5:16:27 PM By: Montey Hora Entered By: Montey Hora on 01/03/2019 11:30:06 [name], [name] ([id number]) -------------------------------------------------------------------------------- Debridement Details Patient Name: [name] Date of Service: 01/03/2019 10:30 AM Medical Record Number: [id number] Patient Account Number: [id number] Date of Birth/Sex: Mar 09, 1923 (85 y.o. F) Treating RN: Montey Hora Primary Care Provider: Grayland Ormond Other Clinician: Referring Provider: Grayland Ormond Treating Provider/Extender: Melburn Hake, Aulden Calise Weeks in Treatment: 73 Debridement Performed for Wound #5 Right,Distal Toe Great Assessment: Performed By: Physician STONE III, Broghan Pannone E., PA-C Debridement Type: Debridement Level of Consciousness (Pre- Awake and Alert procedure): Pre-procedure Verification/Time Yes - 11:29 Out Taken: Start Time: 11:29 Pain Control: Lidocaine 4% Topical Solution Total Area Debrided (L x W): 0.3 (cm) x 0.4 (cm) = 0.12 (cm) Tissue and other material Viable, Non-Viable, Callus, Slough, Subcutaneous, Slough debrided: Level: Skin/Subcutaneous Tissue Debridement Description: Excisional Instrument: Curette Bleeding: Minimum Hemostasis Achieved: Pressure End Time: 11:31 Procedural Pain: 0 Post Procedural Pain: 0 Response to Treatment: Procedure was tolerated well Level of Consciousness Awake and Alert (Post-procedure): Post Debridement Measurements of Total Wound Length: (cm)  0.3 Stage: Category/Stage II Width: (cm) 0.4 Depth: (cm) 0.2 Volume: (cm) 0.019 Character of Wound/Ulcer Post Improved Debridement: Post Procedure Diagnosis Same as Pre-procedure Electronic Signature(s) Signed: 01/03/2019 5:12:31 PM By: Worthy Keeler PA-C Signed: 01/03/2019 5:16:27 PM By: Montey Hora Entered By: Montey Hora on 01/03/2019 11:30:52 [name], [name] ([id number]) -------------------------------------------------------------------------------- Debridement Details Patient Name: [name] Date of Service: 01/03/2019 10:30 AM Medical Record Number: [id number] Patient Account Number: [id number] Date of Birth/Sex: October 07, 1923 (85 y.o. F) Treating RN: Montey Hora Primary Care Provider: Grayland Ormond Other Clinician: Referring Provider: Grayland Ormond Treating Provider/Extender: Melburn Hake, Chrisanne Loose  Weeks in Treatment: 73 Debridement Performed for Wound #2 Left,Lateral Malleolus Assessment: Performed By: Physician STONE III, Kam Rahimi E., PA-C Debridement Type: Debridement Severity of Tissue Pre Fat layer exposed Debridement: Level of Consciousness (Pre- Awake and Alert procedure): Pre-procedure Verification/Time Yes - 11:31 Out Taken: Start Time: 11:31 Pain Control: Lidocaine 4% Topical Solution Total Area Debrided (L x W): 0.8 (cm) x 0.7 (cm) = 0.56 (cm) Tissue and other material Viable, Non-Viable, Slough, Subcutaneous, Slough debrided: Level: Skin/Subcutaneous Tissue Debridement Description: Excisional Instrument: Curette Bleeding: Minimum Hemostasis Achieved: Pressure End Time: 11:33 Procedural Pain: 0 Post Procedural Pain: 0 Response to Treatment: Procedure was tolerated well Level of Consciousness Awake and Alert (Post-procedure): Post Debridement Measurements of Total Wound Length: (cm) 0.8 Stage: Category/Stage IV Width: (cm) 0.7 Depth: (cm) 0.4 Volume: (cm) 0.176 Character of Wound/Ulcer  Post Improved Debridement: Severity of Tissue Post Debridement: Fat layer exposed Post Procedure Diagnosis Same as Pre-procedure Electronic Signature(s) Signed: 01/03/2019 5:12:31 PM By: Worthy Keeler PA-C Signed: 01/03/2019 5:16:27 PM By: Montey Hora Entered By: Montey Hora on 01/03/2019 11:33:41 [name], [name] ([id number]) -------------------------------------------------------------------------------- Debridement Details Patient Name: [name] Date of Service: 01/03/2019 10:30 AM Medical Record Number: [id number] Patient Account Number: [id number] Date of Birth/Sex: 1923/07/11 (85 y.o. F) Treating RN: Montey Hora Primary Care Provider: Grayland Ormond Other Clinician: Referring Provider: Grayland Ormond Treating Provider/Extender: Melburn Hake, Taleen Prosser Weeks in Treatment: 103 Debridement Performed for Wound #3 Left Calcaneus Assessment: Performed By: Physician STONE III, Zanya Lindo E., PA-C Debridement Type: Debridement Level of Consciousness (Pre- Awake and Alert procedure): Pre-procedure Verification/Time Yes - 11:33 Out Taken: Start Time: 11:33 Pain Control: Lidocaine 4% Topical Solution Total Area Debrided (L x W): 0.9 (cm) x 1.4 (cm) = 1.26 (cm) Tissue and other material Viable, Non-Viable, Slough, Subcutaneous, Slough debrided: Level: Skin/Subcutaneous Tissue Debridement Description: Excisional Instrument: Curette Bleeding: Minimum Hemostasis Achieved: Pressure End Time: 11:36 Procedural Pain: 0 Post Procedural Pain: 0 Response to Treatment: Procedure was tolerated well Level of Consciousness Awake and Alert (Post-procedure): Post Debridement Measurements of Total Wound Length: (cm) 0.9 Stage: Category/Stage II Width: (cm) 1.4 Depth: (cm) 0.2 Volume: (cm) 0.198 Character of Wound/Ulcer Post Improved Debridement: Post Procedure Diagnosis Same as Pre-procedure Electronic Signature(s) Signed: 01/03/2019 5:12:31 PM By: Worthy Keeler  PA-C Signed: 01/03/2019 5:16:27 PM By: Montey Hora Entered By: Montey Hora on 01/03/2019 11:35:31 [name], [name] ([id number]) -------------------------------------------------------------------------------- HPI Details Patient Name: [name] Date of Service: 01/03/2019 10:30 AM Medical Record Number: [id number] Patient Account Number: [id number] Date of Birth/Sex: Jul 10, 1923 (85 y.o. 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 Dr. Amalia Hailey the podiatrist saw her on 07/20/2017 and sent her to the wound center for possible hyperbaric oxygen therapy. past medical history of peripheral vascular disease, varicose veins, status post appendectomy, basal cell carcinoma excision from the left leg, cholecystectomy, pacemaker placement, right lower extremity angiography done by Dr. dew in March 2017 with placement of a stent. there is also note of a successful ablation of the right small saphenous vein done which was reviewed by ultrasound on 10/24/2016. the patient had a right small saphenous vein ablation done on 10/20/2016. The patient has never been a smoker. She has been seen  by Dr. Corene Cornea dew the vascular surgeon who most recently saw her on 06/15/2017 for evaluation of ongoing problems with right leg swelling. She had a lower extremity arterial duplex examination done(02/13/17) which showed patent distal right superficial femoral artery stent and above-the-knee popliteal stent without evidence of restenosis. The ABI was more than 1.3 on the right and more than 1.3 on the left. This was consistent with  noncompressible arteries due to medial calcification. The right great toe pressure and PPG waveforms are within normal limits and the left great toe pressure and PPG waveforms are decreased. he recommended she continue to wear her compression stockings and continue with elevation. She is scheduled to have a noninvasive arterial study in the near future 08/16/2017 -- had a lower extremity arterial duplex examination done which showed patent distal right superficial femoral artery stent and above-the-knee popliteal stent without evidence of restenosis. The ABI was more than 1.3 on the right and more than 1.3 on the left. This was consistent with noncompressible arteries due to medial calcification. The right great toe pressure and PPG waveforms are within normal limits and the left great toe pressure and PPG waveforms are decreased. the x-ray of the right ankle has not yet been done 08/24/2017 -- had a right ankle x-ray -- IMPRESSION:1. No fracture, bone lesion or evidence of osteomyelitis. 2. Lateral soft tissue swelling with a soft tissue ulcer. she has not yet seen the vascular surgeon for review 08/31/17 on evaluation today patient's wound appears to be showing signs of improvement. She still with her appointment with vascular in order to review her results of her vascular study and then determine if any intervention would be recommended at that time. No fevers, chills, nausea, or vomiting noted at this time. She has been tolerating the dressing changes without complication. 09/28/17 on evaluation today patient's wound appears to show signs of good improvement in regard to the granulation tissue which is surfacing. There is still a layer of slough covering the wound and the posterior portion is still significantly deeper than the anterior nonetheless there has been some good sign of things moving towards the better. She is going to go back to Dr. dew for reevaluation to ensure her blood flow is  still appropriate. That will be before her next evaluation with [place] next week. No fevers, chills, nausea, or vomiting noted at this time. Patient does have some discomfort rated to be a 3-4/10 depending on activity specifically cleansing the wound makes it worse. 10/05/2017 -- the patient was seen by Dr. Lucky Cowboy last week and noninvasive studies showed a normal right ABI with brisk triphasic waveforms consistent with no arterial insufficiency including normal digital pressures. The duplex showed a patent distal right SFA stent and the proximal SFA was also normal. He was pleased with her test and thought she should have enough of perfusion for normal wound healing. He would see her back in 6 months time. 12/21/17 on evaluation today patient appears to be doing fairly well in regard to her right lateral ankle wound. Unfortunately the main issue that she is expansion at this point is that she is having some issues with what appears to be some cellulitis in the CYNDY, BRAVER. ([id number]) right anterior shin. She has also been noting a little bit of uncomfortable feeling especially last night and her ankle area. I'm afraid that she made the developing a little bit of an infection. With that being said I think it is in the early stages. 12/28/17 on  evaluation today patient's ankle appears to be doing excellent. She's making good progress at this point the cellulitis seems to have improved after last week's evaluation. Overall she is having no significant discomfort which is excellent news. She does have an appointment with Dr. dew on March 29, 2018 for reevaluation in regard to the stent he placed. She seems to have excellent blood flow in the right lower extremity. 01/19/12 on evaluation today patient's wound appears to be doing very well. In fact she does not appear to require debridement at this point, there's no evidence of infection, and overall from the standpoint of the wound she seems to be doing  very well. With that being said I believe that it may be time to switch to different dressing away from the [facility] Dressing she tells me she does have a lot going on her friend actually passed away yesterday and she's also having a lot of issues with her husband this obviously is weighing heavy on her as far as your thoughts and concerns today. 01/25/18 on evaluation today patient appears to be doing fairly well in regard to her right lateral malleolus. She has been tolerating the dressing changes without complication. Overall I feel like this is definitely showing signs of improvement as far as how the overall appearance of the wound is there's also evidence of epithelium start to migrate over the granulation tissue. In general I think that she is progressing nicely as far as the wound is concerned. The only concern she really has is whether or not we can switch to every other week visits in order to avoid having as many appointments as her daughters have a difficult time getting her to her appointments as well as the patient's husband to his he is not doing very well at this point. 02/22/18 on evaluation today patient's right lateral malleolus ulcer appears to be doing great. She has been tolerating the dressing changes without complication. Overall you making excellent progress at this time. Patient is having no significant discomfort. 03/15/18 on evaluation today patient appears to be doing much more poorly in regard to her right lateral ankle ulcer at this point. Unfortunately since have last seen her her husband has passed just a few days ago is obviously weighed heavily on her her daughter also had surgery well she is with her today as usual. There does not appear to be any evidence of infection she does seem to have significant contusion/deep tissue injury to the right lateral malleolus which was not noted previous when I saw her last. It's hard to tell of exactly when this injury  occurred although during the time she was spending the night in the hospital this may have been most likely. 03/22/18 on evaluation today patient appears to actually be doing very well in regard to her ulcer. She did unfortunately have a setback which was noted last week however the good news is we seem to be getting back on track and in fact the wound in the core did still have some necrotic tissue which will be addressed at this point today but in general I'm seeing signs that things are on the up and up. She is glad to hear this obviously she's been somewhat concerned that due to the how her wound digressed more recently. 03/29/18 on evaluation today patient appears to be doing fairly well in regard to her right lower extremity lateral malleolus ulcer. She unfortunately does have a new area of pressure injury over the inferior  portion where the wound has opened up a little bit larger secondary to the pressure she seems to be getting. She does tell me sometimes when she sleeps at night that it actually hurts and does seem to be pushing on the area little bit more unfortunately. There does not appear to be any evidence of infection which is good news. She has been tolerating the dressing changes without complication. She also did have some bruising in the left second and third toes due to the fact that she may have bump this or injured it although she has neuropathy so she does not feel she did move recently that may have been where this came from. Nonetheless there does not appear to be any evidence of infection at this time. 04/12/18 on evaluation today patient's wound on the right lateral ankle actually appears to be doing a little bit better with a lot of necrotic docking tissue centrally loosening up in clearing away. However she does have the beginnings of a deep tissue injury on the left lateral malleolus likely due to the fact we've been trying offload the right as much as we have. I think she  may benefit from an assistive soft device to help with offloading and it looks like they're looking at one of the doughnut conditions that wraps around the lower leg to offload which I think will definitely do a good job. With that being said I think we definitely need to address this issue on the left before it becomes a wound. Patient is not having significant pain. 04/19/18 on evaluation today patient appears to be doing excellent in regard to the progress she's made with her right lateral ankle ulcer. The left ankle region which did show evidence of a deep tissue injury seems to be resolving there's little fluid noted underneath and a blister there's nothing open at this point in time overall I feel like this is progressing nicely which is good news. She does not seem to be having significant discomfort at this point which is also good news. 04/25/18-She is here in follow up evaluation for bilateral lateral malleolar ulcers. The right lateral malleolus ulcer with pale subcutaneous tissue exposure, central area of ulcer with tendon/periosteum exposed. The left lateral malleolus ulcer now with Smoot, [name] ([id number]) central area of nonviable tissue, otherwise deep tissue injury. She is wearing compression wraps to the left lower extremity, she will place the right lower extremity compression wraps on when she gets home. She will be out of town over the weekend and return next week and follow-up appointment. She completed her doxycycline this morning 05/03/18 on evaluation today patient appears to be doing very well in regard to her right lateral ankle ulcer in general. At least she's showing some signs of improvement in this regard. Unfortunately she has some additional injury to the left lateral malleolus region which appears to be new likely even over the past several days. Again this determination is based on the overall appearance. With that being said the patient is obviously frustrated  about this currently. 05/10/18-She is here in follow-up evaluation for bilateral lateral malleolar ulcers. She states she has purchased offloading shoes/boots and they will arrive tomorrow. She was asked to bring them in the office at next week's appointment so her provider is aware of product being utilized. She continues to sleep on right or left side, she has been encouraged to sleep on her back. The right lateral malleolus ulcer is precariously close to peri-osteum; will order  xray. The left lateral malleolus ulcer is improved. Will switch back to santyl; she will follow up next week. 05/17/18 on evaluation today patient actually appears to be doing very well in regard to her malleolus her ulcers compared to last time I saw them. She does not seem to have as much in the way of contusion at this point which is great news. With that being said she does continue to have discomfort and I do believe that she is still continuing to benefit from the offloading/pressure reducing boots that were recommended. I think this is the key to trying to get this to heal up completely. 05/24/18 on evaluation today patient actually appears to be doing worse at this point in time unfortunately compared to her last week's evaluation. She is having really no increased pain which is good news unfortunately she does have more maceration in your theme and noted surrounding the right lateral ankle the left lateral ankle is not really is erythematous I do not see signs of the overt cellulitis on that side. Unfortunately the wounds do not seem to have shown any signs of improvement since the last evaluation. She also has significant swelling especially on the right compared to previous some of this may be due to infection however also think that she may be served better while she has these wounds by compression wrapping versus continuing to use the Juxta-Lite for the time being. Especially with the amount of drainage that she is  experiencing at this point. No fevers, chills, nausea, or vomiting noted at this time. 05/31/18 on evaluation today patient appears to actually be doing better in regard to her right lateral lower extremity ulcer specifically on the malleolus region. She has been tolerating the antibiotic without complication. With that being said she still continues to have issues but a little bit of redness although nothing like she what she was experiencing previous. She still continues to pressure to her ankle area she did get the problem on offloading boots unfortunately she will not wear them she states there too uncomfortable and she can't get in and out of the bed. Nonetheless at this point her wounds seem to be continually getting worse which is not what we want I'm getting somewhat concerned about her progress and how things are going to proceed if we do not intervene in some way shape or form. I therefore had a very lengthy conversation today about offloading yet again and even made a specific suggestion for switching her to a memory foam mattress and even gave the information for a specific one that they could look at getting if it was something that they were interested in considering. She does not want to be considered for a hospital bed air mattress although honestly insurance would not cover it that she does not have any wounds on her trunk. 06/14/18 on evaluation today both wounds over the bilateral lateral malleolus her ulcers appear to be doing better there's no evidence of pressure injury at this point. She did get the foam mattress for her bed and this does seem to have been extremely beneficial for her in my pinion. Her daughter states that she is having difficulty getting out of bed because of how soft it is. The patient also relates this to be. Nonetheless I do feel like she's actually doing better. Unfortunately right after and around the time she was getting the mattress she also sustained a  fall when she got up to go pick up the phone  and ended up injuring her right elbow she has 18 sutures in place. We are not caring for this currently although home health is going to be taking the sutures out shortly. Nonetheless this may be something that we need to evaluate going forward. It depends on how well it has or has not healed in the end. She also recently saw an orthopedic specialist for an injection in the right shoulder just before her fall unfortunately the fall seems to have worsened her pain. 06/21/18 on evaluation today patient appears to be doing about the same in regard to her lateral malleolus ulcers. Both appear to be just a little bit deeper but again we are clinging away the necrotic and dead tissue which I think is why this is progressing towards a deeper realm as opposed to improving from my measurement standpoint in that regard. Nonetheless she has been tolerating the dressing changes she absolutely hates the memory foam mattress topper that was obtained for her nonetheless I do believe this is still doing excellent as far as taking care of excess pressure in regard to the lateral malleolus regions. She in fact has no pressure injury that I see whereas in weeks past it was week by week I was constantly seeing new pressure injuries. Overall I think it has been very beneficial for her. 07/03/18; patient arrives in my clinic today. She has deep punched out areas over her bilateral lateral malleoli. The area on the right has some more depth. SAHARA, FUJIMOTO ([id number]) We spent a lot of time today talking about pressure relief for these areas. This started when her daughter asked for a prescription for a memory foam mattress. I have never written a prescription for a mattress and I don't think insurances would pay for that on an ordinary bed. In any case he came up that she has foam boots that she refuses to wear. I would suggest going to these before any other offloading  issues when she is in bed. They say she is meticulous about offloading this the rest of the day 07/10/18- She is seen in follow-up evaluation for bilateral, lateral malleolus ulcers. There is no improvement in the ulcers. She has purchased and is sleeping on a memory foam mattress/overlay, she has been using the offloading boots nightly over the past week. She has a follow up appointment with vascular medicine at the end of October, in my opinion this follow up should be expedited given her deterioration and suboptimal TBI results. We will order plain film xray of the left ankle as deeper structures are palpable; would consider having MRI, regardless of xray report(s). The ulcers will be treated with iodoflex/iodosorb, she is unable to safely change the dressings daily with santyl. 07/19/18 on evaluation today patient appears to be doing in general visually well in regard to her bilateral lateral malleolus ulcers. She has been tolerating the dressing changes without complication which is good news. With that being said we did have an x-ray performed on 07/12/18 which revealed a slight loosen see in the lateral portion of the distal left fibula which may represent artifact but underline lytic destruction or osteomyelitis could not be excluded. MRI was recommended. With that being said we can see about getting the patient scheduled for an MRI to further evaluate this area. In fact we have that scheduled currently for August 20 19,019. 07/26/18 on evaluation today patient's wound on the right lateral ankle actually appears to be doing fairly well at this point in my  pinion. She has made some good progress currently. With that being said unfortunately in regard to the left lateral ankle ulcer this seems to be a little bit more problematic at this time. In fact as I further evaluated the situation she actually had bone exposed which is the first time that's been the case in the bone appear to be  necrotic. Currently I did review patient's note from Dr. Bunnie Domino office with [facility]. He stated that ABI was 1.26 on the right and 0.95 on the left with good waveforms. Her perfusion is stable not reduced from previous studies and her digital waveforms were pretty good particularly on the right. His conclusion upon review of the note was that there was not much she could do to improve her perfusion and he felt she was adequate for wound healing. His suggestion was that she continued to see [place] and consider a synthetic skin graft if there was no underlying infection. He plans to see her back in six months or as needed. 08/01/18 on evaluation today patient appears to be doing better in regard to her right lateral ankle ulcer. Her left lateral ankle ulcer is about the same she still has bone involvement in evidence of necrosis. There does not appear to be evidence of infection at this time On the right lateral lower extremity. I have started her on the Augmentin she picked this up and started this yesterday. This is to get her through until she sees infectious disease which is scheduled for 08/12/18. 08/06/18 on evaluation today patient appears to be doing rather well considering my discussion with patient's daughter at the end of last week. The area which was marked where she had erythema seems to be improved and this is good news. With that being said overall the patient seems to be making good improvement when it comes to the overall appearance of the right lateral ankle ulcer although this has been slow she at least is coming around in this regard. Unfortunately in regard to the left lateral ankle ulcer this is osteomyelitis based on the pathology report as well is bone culture. Nonetheless we are still waiting CT scan. Unfortunately the MRI we originally ordered cannot be performed as the patient is a pacemaker which I had overlooked. Nonetheless we are working on the CT scan  approval and scheduling as of now. She did go to the hospital over the weekend and was placed on IV Cefzo for a couple of days. Fortunately this seems to have improved the erythema quite significantly which is good news. There does not appear to be any evidence of worsening infection at this time. She did have some bleeding after the last debridement therefore I did not perform any sharp debridement in regard to left lateral ankle at this point. Patient has been approved for a snap vac for the right lateral ankle. 08/14/18; the patient with wounds over her bilateral lateral malleoli. The area on the right actually looks quite good. Been using a snap back on this area. Healthy granulation and appears to be filling in. Unfortunately the area on the left is really problematic. 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 Also noted to have cellulitis. She saw Dr. Novella Olive of infectious disease today and was put on linezolid. We are able to verify this with her pharmacy. She is completed the Augmentin that she was already on. We've been using Iodoflex  to this area 08/23/18 on evaluation today patient's wounds both actually appear to be doing better compared to my prior evaluations. Fortunately she showing signs of good improvement in regard to the overall wound status especially where were using the snap vac on the right. In regard to left lateral malleolus the wound bed actually appears to be much cleaner than previously noted. I do not feel any phone directly probed during evaluation today and though there is tendon noted this does not appear Bon, [name] ([id number]) to be necrotic it's actually fairly good as far as the overall appearance of the tendon is concerned. In general the wound bed actually appears to be doing significantly better than it was previous. Patient is currently in the care of [name] and I did  review that note today. He actually has her on two weeks of linezolid and then following the patient will be on 1-2 months of Keflex. That is the plan currently. She has been on antibiotics therapy as prescribed by myself initially starting on July 30, 2018 and has been on that continuously up to this point. 08/30/18 on evaluation today patient actually appears to be doing much better in regard to her right lateral malleolus ulcer. She has been tolerating the dressing changes specifically the snap vac without complication although she did have some issues with the seal currently. Apparently there was some trouble with getting it to maintain over the past week past Sunday. Nonetheless overall the wound appears better in regard to the right lateral malleolus region. In regard to left lateral malleolus this actually show some signs of additional granulation although there still tendon noted in the base of the wound this appears to be healthy not necrotic in any way whatsoever. We are considering potentially using a snap vac for the left lateral malleolus as well the product wrap from [employer], [place], was present in the clinic today we're going to see this patient I did have her come in with me after obtaining consent from the patient and her daughter in order to look at the wound and see if there's any recommendation one way or another as to whether or not they felt the snapback could be beneficial for the left lateral malleolus region. But the conclusion was that it might be but that this is definitely a little bit deeper wound than what traditionally would be utilized for a snap vac. 09/06/18 on evaluation today patient actually appears to be doing excellent in my pinion in regard to both ankle ulcers. She has been tolerating the dressing changes without complication which is great news. Specifically we have been using the snap vac. In regard to the right ankle I'm not even sure that this is going to be  necessary for today and following as the wound has filled in quite nicely. In regard to the left ankle I do believe that we're seeing excellent epithelialization from the edge as well as granulation in the central portion the tendon is still exposed but there's no evidence of necrotic bone and in general I feel like the patient has made excellent progress even compared to last week with just one week of the snap vac. 09/11/18; this is a patient who has wounds on her bilateral lateral malleoli. Initially both of these were deep stage IV wounds in the setting of chronic arterial insufficiency. She has been revascularized. As I understand think she been using snap vacs to both of these wounds however the area on the right became  more superficial and currently she is only using it on the left. Using silver collagen on the right and silver collagen under the back on the left I believe 09/19/18 on evaluation today patient actually appears to be doing very well in regard to her lateral malleolus or ulcers bilaterally. She has been tolerating the dressing changes without complication. Fortunately there does not appear to be any evidence of infection at this time. Overall I feel like she is improving in an excellent manner and I'm very pleased with the fact that everything seems to be turning towards the better for her. This has obviously been a long road. 09/27/18 on evaluation today patient actually appears to be doing very well in regard to her bilateral lateral malleolus ulcers. She has been tolerating the dressing changes without complication. Fortunately there does not appear to be any evidence of infection at this time which is also great news. No fevers, chills, nausea, or vomiting noted at this time. Overall I feel like she is doing excellent with the snap vac on the left malleolus. She had 40 mL of fluid collection over the past week. 10/04/18 on evaluation today patient actually appears to be doing  well in regard to her bilateral lateral malleolus ulcers. She continues to tolerate the dressing changes without complication. One issue that I see is the snap vac on the left lateral malleolus which appears to have sealed off some fluid underlying this area and has not really allowed it to heal to the degree that I would like to see. For that reason I did suggest at this point we may want to pack a small piece of packing strip into this region to allow it to more effectively wick out fluid. 10/11/18 in general the patient today does not feel that she has been doing very well. She's been a little bit lethargic and subsequently is having bodyaches as well according to what she tells me today. With that being said overall she has been concerned with the fact that something may be worsening although to be honest her wounds really have not been appearing poorly. She does have a new ulcer on her left heel unfortunately. This may be pressure related. Nonetheless it seems to me to have potentially started at least as a blister I do not see any evidence of deep tissue injury. In regard to the left ankle the snap vac still seems to be causing the ceiling off of the deeper part of the wound which is in turn trapping fluid. I'm not extremely pleased with the overall appearance as far as progress from last week to this week therefore I'm gonna discontinue the snap vac at this point. 10/18/18 patient unfortunately this point has not been feeling well for the past several days. She was seen by Grayland Ormond her primary care provider who is a Librarian, academic at [facility]. Subsequently she states that she's been very weak and generally feeling malaise. No fevers, chills, nausea, or vomiting noted at this time. With that being said bloodwork was performed at the PCP office on the 11th of this month which showed a white blood cell count of 10.7. This was repeated today [name], [name] ([id number]) and  shows a white blood cell count of 12.4. This does show signs of worsening. Coupled with the fact that she is feeling worse and that her left ankle wound is not really showing signs of improvement I feel like this is an indication that the osteomyelitis is likely exacerbating  not improving. Overall I think we may also want to check her C-reactive protein and sedimentation rate. Actually did call [facility] office this afternoon while the patient was in the office here with me. Subsequently based on the findings we discussed treatment possibilities and I think that it is appropriate for [place] to go ahead and initiate treatment with doxycycline which I'm going to do. Subsequently he did agree to see about adding a CRP and sedimentation rate to her orders. If that has not already been drawn to where they can run it they will contact the patient she can come back to have that check. They are in agreement with plan as far as the patient and her daughter are concerned. Nonetheless also think we need to get in touch with Dr. Henreitta Leber office to see about getting the patient scheduled with him as soon as possible. 11/08/18 on evaluation today patient presents for follow-up concerning her bilateral foot and ankle ulcers. I did do an extensive review of her chart in epic today. Subsequently she was seen by [name] he did initiate Cefepime IV antibiotic therapy. Subsequently she had some issues with her PICC line this had to be removed because it was coiled and then replaced. Fortunately that was now settled. Unfortunately she has continued have issues with her left heel as well as the issues that she is experiencing with her bilateral lateral malleolus regions. I do believe however both areas seem to be doing a little bit better on evaluation today which is good news. No fevers, chills, nausea, or vomiting noted at this time. She actually has an angiogram schedule with Dr. dew on this coming Monday, November 11, 2018. Subsequently the patient states that she is feeling much better especially than what she was roughly 2 weeks ago. She actually had to cancel an appointment because she was feeling so poorly. No fevers, chills, nausea, or vomiting noted at this time. 11/15/18 on evaluation today patient actually is status post having had her angiogram with Dr. dew Monday, four days ago. 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 He had to go and work on several areas of the vasculature fortunately he was able to obtain no more than a 30% residual stenosis throughout post procedure. I reviewed this note today. I think this will definitely help with healing at this time. Fortunately there does not appear to be any signs of infection and I do feel like ratio already has a better appearance to it. 11/22/18 upon evaluation today patient actually appears to be doing very well in regard to her wounds in general. The right lateral malleolus looks excellent the heel looks better in the left lateral malleolus also appears to be doing a little better. With that being said the right second toe actually appears to be open and training we been watching this is been dry and stable but now is open. 12/03/2018 Seen today for follow-up and management of multiple bilateral lower extremity wounds. New pressure injury of the great toe which is closed at this time. Wound of the right distal second toe appears larger today with deep undermining and a pocket of fluid present within the undermining region. Left and right malleolus is wounds are stable today with no signs and symptoms of infection.Denies any needs or concerns during exam today. 12/13/18 on evaluation today patient appears to be doing somewhat better in regard to her left heel ulcer. She also seems to be completely  healed in regard to the right lateral malleolus ulcer. The left malleolus ulcer is smaller what unfortunately the wounds which  are new over the first and second toes of the right foot are what are most concerning at this point especially the second. Both areas did require sharp debridement today. 12/20/18 on evaluation today patient's wound actually appears to be doing better in regard to left lateral ankle and her right lateral ankle continues to remain healed. The hill ulcer on the left is improved. She does have improvement noted as well in regard to both toe ulcers. Overall I'm very pleased in this regard. No fevers, chills, nausea, or vomiting noted at this time. 12/23/18 on evaluation today patient is seen after she had her toenails trimmed at the podiatrist office due to issues with her right great toe. There was what appeared to be dark eschar on the surface of the wound which had her in the podiatrist concerned. Nonetheless as I remember that during the last office visit I had utilize silver nitrate of this area I was much less concerned about the situation. Subsequently I was able to clean off much of this tissue without any complication today. This does not appear to show any signs of infection and actually look somewhat better compared to last time post debridement. Her second toe on the right foot actually had callous over and there did appear still be some fluid underneath this that would require debridement today. 12/27/18 on evaluation today patient actually appears to be showing signs of improvement at all locations. Even the left lateral ankle although this is not quite as great as the other sites. Fortunately there does not appear to be any signs of infection at this time and both of her toes on the right foot seem to be showing signs of improvement which is good news and very pleased in this regard. 01/03/19 on evaluation today patient appears to be doing better for the most part in regard to her wounds in particular. There Ingram, [name] ([id number]) does not appear to be any evidence of infection at  this time which is good news. Fortunately there is no sign of really worsening anywhere except for the right great toe which she does have what appears to be a bruise/deep tissue injury which is very superficial and already resolving. I'm not sure where this came from I questioned her extensively and she does not recall what may have happened with this. Other than that the patient seems to be doing well even the left lateral ankle ulcer looks good and is getting smaller. Electronic Signature(s) Signed: 01/03/2019 5:12:31 PM By: Worthy Keeler PA-C Entered By: Worthy Keeler on 01/03/2019 12:30:52 Jolly, [name] ([id number]) -------------------------------------------------------------------------------- Physical Exam Details Patient Name: [name] Date of Service: 01/03/2019 10:30 AM Medical Record Number: [id number] Patient Account Number: [id number] Date of Birth/Sex: 1923/11/21 (85 y.o. F) Treating RN: Montey Hora Primary Care Provider: Grayland Ormond Other Clinician: Referring Provider: Grayland Ormond Treating Provider/Extender: STONE III, Runette Scifres Weeks in Treatment: 36 Constitutional Well-nourished and well-hydrated in no acute distress. Respiratory normal breathing without difficulty. Psychiatric this patient is able to make decisions and demonstrates good insight into disease process. Alert and Oriented x 3. pleasant and cooperative. Notes Patient's wounds did require sharp debridement in all locations today. She tolerated this without any pain fortunately bleeding was stopped with pressure in regard to the right great toe the use of a silver alginate dressing. Overall she tolerated the procedures today without  complication everything appears to be doing much better post debridement. Electronic Signature(s) Signed: 01/03/2019 5:12:31 PM By: Worthy Keeler PA-C Entered By: Worthy Keeler on 01/03/2019 12:31:20 [name], [name]  ([id number]) -------------------------------------------------------------------------------- Physician Orders Details Patient Name: [name] Date of Service: 01/03/2019 10:30 AM Medical Record Number: [id number] Patient Account Number: [id number] Date of Birth/Sex: 05/17/1923 (85 y.o. F) Treating RN: Montey Hora Primary Care Provider: Grayland Ormond Other Clinician: Referring Provider: Grayland Ormond Treating Provider/Extender: Melburn Hake, Isadore Bokhari Weeks in Treatment: 66 Verbal / Phone Orders: No Diagnosis Coding ICD-10 Coding Code Description 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, stage 4 L89.524 Pressure ulcer of left ankle, stage 4 L89.623 Pressure ulcer of left heel, stage 3 L89.892 Pressure ulcer of other site, stage 2 I70.233 Atherosclerosis of native arteries of right leg with ulceration of ankle I70.243 Atherosclerosis of native arteries of left leg with ulceration of ankle I89.0 Lymphedema, not elsewhere classified B35.4 Tinea corporis 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 o Cleanse wound with mild soap and water o May Shower, gently pat wound dry prior to applying new dressing. Wound #3 Left Calcaneus o Clean wound with Normal Saline. o Cleanse wound with mild soap and water o May Shower, gently pat wound dry prior to applying new dressing. Wound #4 Right,Distal Toe Second o Clean wound with Normal Saline. o Cleanse wound with mild soap and water o May Shower, gently pat wound dry prior to applying new dressing. Wound #5 Right,Distal Toe Great o Clean wound with Normal Saline. o Cleanse wound with mild soap and water o May Shower, gently pat wound dry prior to applying new dressing. Anesthetic (add to Medication List) Wound #2 Left,Lateral Malleolus o Topical Lidocaine 4% cream applied to wound bed prior to debridement (In  Clinic Only). Wound #3 Left Calcaneus o Topical Lidocaine 4% cream applied to wound bed prior to debridement (In Clinic Only). Wound #4 Right,Distal Toe Second o Topical Lidocaine 4% cream applied to wound bed prior to debridement (In Clinic Only). KASLYN, RICHBURG ([id number]) Wound #5 Right,Distal Toe Great o Topical Lidocaine 4% cream applied to wound bed prior to debridement (In Clinic Only). Primary Wound Dressing Wound #2 Left,Lateral Malleolus o Other: - SNAP VAC with prisma to tunnel Wound #3 Left Calcaneus o Silver Collagen Wound #4 Right,Distal Toe Second o Silver Alginate - with mupirocin to wound bed Wound #5 Right,Distal Toe Great o Silver Alginate - with mupirocin to wound bed Secondary Dressing Wound #3 Left Calcaneus o Boardered Foam Dressing - Please keep a bordered foam dressing on the right malleolus with bactroban Wound #4 Right,Distal Toe Second o Conform/Kerlix Wound #5 Right,Distal Toe Great o Conform/Kerlix Dressing Change Frequency Wound #2 Left,Lateral Malleolus o Change Dressing Monday, Wednesday, Friday Wound #3 Left Calcaneus o Change Dressing Monday, Wednesday, Friday Wound #4 Right,Distal Toe Second o Change Dressing Monday, Wednesday, Friday Wound #5 Right,Distal Toe Great o Change Dressing Monday, Wednesday, Friday Follow-up Appointments Wound #2 Left,Lateral Malleolus o Return Appointment in 1 week. Wound #3 Left Calcaneus o Return Appointment in 1 week. Wound #4 Right,Distal Toe Second o Return Appointment in 1 week. Wound #5 Right,Distal Toe Great o Return Appointment in 1 week. Edema Control Wound #2 Left,Lateral Malleolus Arrazola, [name] ([id number]) o Patient to wear own compression stockings o Patient to wear own Velcro compression garment. o Elevate legs to the level of the heart and pump ankles as often as  possible Wound #3 Left Calcaneus o Patient to wear own compression  stockings o Patient to wear own Velcro compression garment. o Elevate legs to the level of the heart and pump ankles as often as possible Wound #4 Right,Distal Toe Second o Patient to wear own compression stockings o Patient to wear own Velcro compression garment. o Elevate legs to the level of the heart and pump ankles as often as possible Wound #5 Right,Distal Toe Great o Patient to wear own compression stockings o Patient to wear own Velcro compression garment. o Elevate legs to the level of the heart and pump ankles as often as possible Off-Loading Wound #2 Left,Lateral Malleolus o Turn and reposition every 2 hours o Other: - do not put pressure on your ankles Wound #3 Left Calcaneus o Turn and reposition every 2 hours o Other: - do not put pressure on your ankles Wound #4 Right,Distal Toe Second o Turn and reposition every 2 hours o Other: - do not put pressure on your ankles Wound #5 Right,Distal Toe Great o Turn and reposition every 2 hours o Other: - do not put pressure on your ankles Additional Orders / Instructions Wound #2 Left,Lateral Malleolus o Increase protein intake. o Increase protein intake. Wound #3 Left Calcaneus o Increase protein intake. o Increase protein intake. Wound #4 Right,Distal Toe Second o Increase protein intake. o Increase protein intake. Wound #5 Right,Distal Toe Great o Increase protein intake. o Increase protein intake. Home Health Wound #3 Left [place] Visits - [facility]. Please visit patient Monday, Wednesday, and Friday CATALEIA, GADE ([id number]) o Home Health Nurse may visit PRN to address patientos wound care needs. o FACE TO FACE ENCOUNTER: MEDICARE and MEDICAID PATIENTS: I certify that this patient is under my care and that I had a face-to-face encounter that meets the physician face-to-face encounter requirements with this patient on this date. The  encounter with the patient was in whole or in part for the following MEDICAL CONDITION: (primary reason for [place]) MEDICAL NECESSITY: I certify, that based on my findings, NURSING services are a medically necessary home health service. HOME BOUND STATUS: I certify that my clinical findings support that this patient is homebound (i.e., Due to illness or injury, pt requires aid of supportive devices such as crutches, cane, wheelchairs, walkers, the use of special transportation or the assistance of another person to leave their place of residence. There is a normal inability to leave the home and doing so requires considerable and taxing effort. Other absences are for medical reasons / religious services and are infrequent or of short duration when for other reasons). o If current dressing causes regression in wound condition, may D/C ordered dressing product/s and apply Normal Saline Moist Dressing daily until next [place] / Other MD appointment. [place] of regression in wound condition at [phone]. o Please direct any NON-WOUND related issues/requests for orders to patient's Primary Care Physician Wound #4 Right,Distal Toe [place] Visits - [facility]. Please visit patient Monday, Wednesday, and Friday o Home Health Nurse may visit PRN to address patientos wound care needs. o FACE TO FACE ENCOUNTER: MEDICARE and MEDICAID PATIENTS: I certify that this patient is under my care and that I had a face-to-face encounter that meets the physician face-to-face encounter requirements with this patient on this date. The encounter with the patient was in whole or in part for the following MEDICAL CONDITION: (primary reason for [place]) MEDICAL NECESSITY:  I certify, that based on my findings, NURSING services are a medically necessary home health service. HOME BOUND STATUS: I certify that my clinical findings support that this  patient is homebound (i.e., Due to illness or injury, pt requires aid of supportive devices such as crutches, cane, wheelchairs, walkers, the use of special transportation or the assistance of another person to leave their place of residence. There is a normal inability to leave the home and doing so requires considerable and taxing effort. Other absences are for medical reasons / religious services and are infrequent or of short duration when for other reasons). o If current dressing causes regression in wound condition, may D/C ordered dressing product/s and apply Normal Saline Moist Dressing daily until next [place] / Other MD appointment. [place] of regression in wound condition at [phone]. o Please direct any NON-WOUND related issues/requests for orders to patient's Primary Care Physician Wound #5 Right,Distal Toe [place] Visits - [facility]. Please visit patient Monday, Wednesday, and Friday o Home Health Nurse may visit PRN to address patientos wound care needs. o FACE TO FACE ENCOUNTER: MEDICARE and MEDICAID PATIENTS: I certify that this patient is under my care and that I had a face-to-face encounter that meets the physician face-to-face encounter requirements with this patient on this date. The encounter with the patient was in whole or in part for the following MEDICAL CONDITION: (primary reason for [place]) MEDICAL NECESSITY: I certify, that based on my findings, NURSING services are a medically necessary home health service. HOME BOUND STATUS: I certify that my clinical findings support that this patient is homebound (i.e., Due to illness or injury, pt requires aid of supportive devices such as crutches, cane, wheelchairs, walkers, the use of special transportation or the assistance of another person to leave their place of residence. There is a normal inability to leave the home and doing so requires  considerable and taxing effort. Other absences are for medical reasons / religious services and are infrequent or of short duration when for other reasons). o If current dressing causes regression in wound condition, may D/C ordered dressing product/s and apply Normal Saline Moist Dressing daily until next [place] / Other MD appointment. [place] of regression in wound condition at [phone]. o Please direct any NON-WOUND related issues/requests for orders to patient's Primary Care Physician Electronic Signature(s) Signed: 01/03/2019 5:12:31 PM By: Worthy Keeler PA-C Signed: 01/03/2019 5:16:27 PM By: Montey Hora Entered By: Montey Hora on 01/03/2019 11:39:29 [name], [name] ([id number]) [place], [name] ([id number]) -------------------------------------------------------------------------------- Problem List Details Patient Name: LASHICA, HANNAY Date of Service: 01/03/2019 10:30 AM Medical Record Number: [id number] Patient Account Number: [id number] Date of Birth/Sex: [birth year] (85 y.o. 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 08/10/2017 No Yes ankle I70.243 Atherosclerosis of native arteries of left leg with ulceration of 07/10/2018 No Yes ankle I89.0 Lymphedema, not elsewhere classified 08/10/2017 No Yes B35.4 Tinea corporis 09/28/2017 No Yes 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, left ankle and foot 08/14/2018 No Yes Inactive Problems Resolved Problems AMAZIAH, GHOSH ([id number]) ICD-10 Code Description Active Date Resolved Date S90.822A Blister (nonthermal), left foot, initial encounter 10/11/2018 10/11/2018 Electronic Signature(s) Signed: 01/03/2019 5:12:31 PM By: Worthy Keeler PA-C Entered By: Worthy Keeler on 01/03/2019 10:40:35 [name], [name] ([id number]) -------------------------------------------------------------------------------- Progress Note Details Patient Name: [name] Date of Service: 01/03/2019 10:30 AM Medical Record Number: [id number] Patient Account Number: [id number] Date of Birth/Sex: July 16, 1923 (85 y.o. 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 Dr. Amalia Hailey the podiatrist saw her on 07/20/2017 and sent her to the wound center for possible hyperbaric oxygen therapy. past medical history of peripheral vascular disease, varicose veins, status post appendectomy, basal cell carcinoma excision from the left leg, cholecystectomy, pacemaker placement, right lower extremity angiography done by Dr. dew in March 2017 with placement of a stent. there is also note of a successful ablation of the right small saphenous vein done which was reviewed by ultrasound on 10/24/2016. the patient had a right small saphenous vein ablation done on 10/20/2016. The patient has never been a smoker. She has  been seen by Dr. Corene Cornea dew the vascular surgeon who most recently saw her on 06/15/2017 for evaluation of ongoing problems with right leg swelling. She had a lower extremity arterial duplex examination done(02/13/17) which showed patent distal right superficial femoral artery stent and above-the-knee popliteal stent without evidence of restenosis. The ABI was more than 1.3 on the right and more than 1.3 on the left. This was consistent with noncompressible arteries due to medial calcification. The right great toe pressure and PPG waveforms are within normal limits and the left great toe pressure and PPG waveforms are decreased. he recommended she continue to wear her compression stockings and continue with elevation. She is scheduled to have a noninvasive arterial study in the near future 08/16/2017 -- had a lower extremity arterial duplex examination done which showed patent distal right superficial femoral artery stent and above-the-knee popliteal stent without evidence of restenosis. The ABI was more than 1.3 on the right and more than 1.3 on the left. This was consistent with noncompressible arteries due to medial calcification. The right great toe pressure and PPG waveforms are within normal limits and the left great toe pressure and PPG waveforms are decreased. the x-ray of the right ankle has not yet been done 08/24/2017 -- had a right ankle x-ray -- IMPRESSION:1. No fracture, bone lesion or evidence of osteomyelitis. 2. Lateral soft tissue swelling with a soft tissue ulcer. she has not yet seen the vascular surgeon for review 08/31/17 on evaluation today patient's wound appears to be showing signs of improvement. She still with her appointment with vascular in order to review her results of her vascular study and then determine if any intervention would be recommended at that time. No fevers, chills, nausea, or vomiting noted at this time. She has been tolerating the dressing changes  without complication. 09/28/17 on evaluation today patient's wound appears to show signs of good improvement in regard to the granulation tissue  which is surfacing. There is still a layer of slough covering the wound and the posterior portion is still significantly deeper than the anterior nonetheless there has been some good sign of things moving towards the better. She is going to go back to Dr. dew for reevaluation to ensure her blood flow is still appropriate. That will be before her next evaluation with [place] next week. No fevers, chills, nausea, or vomiting noted at this time. Patient does have some discomfort rated to be a 3-4/10 depending on activity specifically cleansing the wound makes it worse. 10/05/2017 -- the patient was seen by Dr. Lucky Cowboy last week and noninvasive studies showed a normal right ABI with brisk AUTYM, SIESS. ([id number]) triphasic waveforms consistent with no arterial insufficiency including normal digital pressures. The duplex showed a patent distal right SFA stent and the proximal SFA was also normal. He was pleased with her test and thought she should have enough of perfusion for normal wound healing. He would see her back in 6 months time. 12/21/17 on evaluation today patient appears to be doing fairly well in regard to her right lateral ankle wound. Unfortunately the main issue that she is expansion at this point is that she is having some issues with what appears to be some cellulitis in the right anterior shin. She has also been noting a little bit of uncomfortable feeling especially last night and her ankle area. I'm afraid that she made the developing a little bit of an infection. With that being said I think it is in the early stages. 12/28/17 on evaluation today patient's ankle appears to be doing excellent. She's making good progress at this point the cellulitis seems to have improved after last week's evaluation. Overall she is having no significant  discomfort which is excellent news. She does have an appointment with Dr. dew on March 29, 2018 for reevaluation in regard to the stent he placed. She seems to have excellent blood flow in the right lower extremity. 01/19/12 on evaluation today patient's wound appears to be doing very well. In fact she does not appear to require debridement at this point, there's no evidence of infection, and overall from the standpoint of the wound she seems to be doing very well. With that being said I believe that it may be time to switch to different dressing away from the [facility] Dressing she tells me she does have a lot going on her friend actually passed away yesterday and she's also having a lot of issues with her husband this obviously is weighing heavy on her as far as your thoughts and concerns today. 01/25/18 on evaluation today patient appears to be doing fairly well in regard to her right lateral malleolus. She has been tolerating the dressing changes without complication. Overall I feel like this is definitely showing signs of improvement as far as how the overall appearance of the wound is there's also evidence of epithelium start to migrate over the granulation tissue. In general I think that she is progressing nicely as far as the wound is concerned. The only concern she really has is whether or not we can switch to every other week visits in order to avoid having as many appointments as her daughters have a difficult time getting her to her appointments as well as the patient's husband to his he is not doing very well at this point. 02/22/18 on evaluation today patient's right lateral malleolus ulcer appears to be doing great. She has been tolerating the  dressing changes without complication. Overall you making excellent progress at this time. Patient is having no significant discomfort. 03/15/18 on evaluation today patient appears to be doing much more poorly in regard to her right lateral  ankle ulcer at this point. Unfortunately since have last seen her her husband has passed just a few days ago is obviously weighed heavily on her her daughter also had surgery well she is with her today as usual. There does not appear to be any evidence of infection she does seem to have significant contusion/deep tissue injury to the right lateral malleolus which was not noted previous when I saw her last. It's hard to tell of exactly when this injury occurred although during the time she was spending the night in the hospital this may have been most likely. 03/22/18 on evaluation today patient appears to actually be doing very well in regard to her ulcer. She did unfortunately have a setback which was noted last week however the good news is we seem to be getting back on track and in fact the wound in the core did still have some necrotic tissue which will be addressed at this point today but in general I'm seeing signs that things are on the up and up. She is glad to hear this obviously she's been somewhat concerned that due to the how her wound digressed more recently. 03/29/18 on evaluation today patient appears to be doing fairly well in regard to her right lower extremity lateral malleolus ulcer. She unfortunately does have a new area of pressure injury over the inferior portion where the wound has opened up a little bit larger secondary to the pressure she seems to be getting. She does tell me sometimes when she sleeps at night that it actually hurts and does seem to be pushing on the area little bit more unfortunately. There does not appear to be any evidence of infection which is good news. She has been tolerating the dressing changes without complication. She also did have some bruising in the left second and third toes due to the fact that she may have bump this or injured it although she has neuropathy so she does not feel she did move recently that may have been where this came from.  Nonetheless there does not appear to be any evidence of infection at this time. 04/12/18 on evaluation today patient's wound on the right lateral ankle actually appears to be doing a little bit better with a lot of necrotic docking tissue centrally loosening up in clearing away. However she does have the beginnings of a deep tissue injury on the left lateral malleolus likely due to the fact we've been trying offload the right as much as we have. I think she may benefit from an assistive soft device to help with offloading and it looks like they're looking at one of the doughnut conditions that wraps around the lower leg to offload which I think will definitely do a good job. With that being said I think we definitely need to address this issue on the left before it becomes a wound. Patient is not having significant pain. CANDICE, TOBEY ([id number]) 04/19/18 on evaluation today patient appears to be doing excellent in regard to the progress she's made with her right lateral ankle ulcer. The left ankle region which did show evidence of a deep tissue injury seems to be resolving there's little fluid noted underneath and a blister there's nothing open at this point in time overall I feel  like this is progressing nicely which is good news. She does not seem to be having significant discomfort at this point which is also good news. 04/25/18-She is here in follow up evaluation for bilateral lateral malleolar ulcers. The right lateral malleolus ulcer with pale subcutaneous tissue exposure, central area of ulcer with tendon/periosteum exposed. The left lateral malleolus ulcer now with central area of nonviable tissue, otherwise deep tissue injury. She is wearing compression wraps to the left lower extremity, she will place the right lower extremity compression wraps on when she gets home. She will be out of town over the weekend and return next week and follow-up appointment. She completed her doxycycline  this morning 05/03/18 on evaluation today patient appears to be doing very well in regard to her right lateral ankle ulcer in general. At least she's showing some signs of improvement in this regard. Unfortunately she has some additional injury to the left lateral malleolus region which appears to be new likely even over the past several days. Again this determination is based on the overall appearance. With that being said the patient is obviously frustrated about this currently. 05/10/18-She is here in follow-up evaluation for bilateral lateral malleolar ulcers. She states she has purchased offloading shoes/boots and they will arrive tomorrow. She was asked to bring them in the office at next week's appointment so her provider is aware of product being utilized. She continues to sleep on right or left side, she has been encouraged to sleep on her back. The right lateral malleolus ulcer is precariously close to peri-osteum; will order xray. The left lateral malleolus ulcer is improved. Will switch back to santyl; she will follow up next week. 05/17/18 on evaluation today patient actually appears to be doing very well in regard to her malleolus her ulcers compared to last time I saw them. She does not seem to have as much in the way of contusion at this point which is great news. With that being said she does continue to have discomfort and I do believe that she is still continuing to benefit from the offloading/pressure reducing boots that were recommended. I think this is the key to trying to get this to heal up completely. 05/24/18 on evaluation today patient actually appears to be doing worse at this point in time unfortunately compared to her last week's evaluation. She is having really no increased pain which is good news unfortunately she does have more maceration in your theme and noted surrounding the right lateral ankle the left lateral ankle is not really is erythematous I do not see signs of  the overt cellulitis on that side. Unfortunately the wounds do not seem to have shown any signs of improvement since the last evaluation. She also has significant swelling especially on the right compared to previous some of this may be due to infection however also think that she may be served better while she has these wounds by compression wrapping versus continuing to use the Juxta-Lite for the time being. Especially with the amount of drainage that she is experiencing at this point. No fevers, chills, nausea, or vomiting noted at this time. 05/31/18 on evaluation today patient appears to actually be doing better in regard to her right lateral lower extremity ulcer specifically on the malleolus region. She has been tolerating the antibiotic without complication. With that being said she still continues to have issues but a little bit of redness although nothing like she what she was experiencing previous. She still continues to  pressure to her ankle area she did get the problem on offloading boots unfortunately she will not wear them she states there too uncomfortable and she can't get in and out of the bed. Nonetheless at this point her wounds seem to be continually getting worse which is not what we want I'm getting somewhat concerned about her progress and how things are going to proceed if we do not intervene in some way shape or form. I therefore had a very lengthy conversation today about offloading yet again and even made a specific suggestion for switching her to a memory foam mattress and even gave the information for a specific one that they could look at getting if it was something that they were interested in considering. She does not want to be considered for a hospital bed air mattress although honestly insurance would not cover it that she does not have any wounds on her trunk. 06/14/18 on evaluation today both wounds over the bilateral lateral malleolus her ulcers appear to be doing  better there's no evidence of pressure injury at this point. She did get the foam mattress for her bed and this does seem to have been extremely beneficial for her in my pinion. Her daughter states that she is having difficulty getting out of bed because of how soft it is. The patient also relates this to be. Nonetheless I do feel like she's actually doing better. Unfortunately right after and around the time she was getting the mattress she also sustained a fall when she got up to go pick up the phone and ended up injuring her right elbow she has [address] sut[address] place. We are not caring for this currently although home health is going to be taking the sutures out shortly. Nonetheless this may be something that we need to evaluate going forward. It depends on how well it has or has not healed in the end. She also recently saw an orthopedic specialist for an injection in the right shoulder just before her fall unfortunately the fall seems to have worsened her pain. 06/21/18 on evaluation today patient appears to be doing about the same in regard to her lateral malleolus ulcers. Both appear to be just a little bit deeper but again we are clinging away the necrotic and dead tissue which I think is why this is progressing towards a deeper realm as opposed to improving from my measurement standpoint in that regard. Nonetheless she has been tolerating the dressing changes she absolutely hates the memory foam mattress topper that was obtained for her nonetheless Kortni, Hasten [name] ([id number]) I do believe this is still doing excellent as far as taking care of excess pressure in regard to the lateral malleolus regions. She in fact has no pressure injury that I see whereas in weeks past it was week by week I was constantly seeing new pressure injuries. Overall I think it has been very beneficial for her. 07/03/18; patient arrives in my clinic today. She has deep punched out areas over her bilateral lateral  malleoli. The area on the right has some more depth. We spent a lot of time today talking about pressure relief for these areas. This started when her daughter asked for a prescription for a memory foam mattress. I have never written a prescription for a mattress and I don't think insurances would pay for that on an ordinary bed. In any case he came up that she has foam boots that she refuses to wear. I would suggest going  to these before any other offloading issues when she is in bed. They say she is meticulous about offloading this the rest of the day 07/10/18- She is seen in follow-up evaluation for bilateral, lateral malleolus ulcers. There is no improvement in the ulcers. She has purchased and is sleeping on a memory foam mattress/overlay, she has been using the offloading boots nightly over the past week. She has a follow up appointment with vascular medicine at the end of October, in my opinion this follow up should be expedited given her deterioration and suboptimal TBI results. We will order plain film xray of the left ankle as deeper structures are palpable; would consider having MRI, regardless of xray report(s). The ulcers will be treated with iodoflex/iodosorb, she is unable to safely change the dressings daily with santyl. 07/19/18 on evaluation today patient appears to be doing in general visually well in regard to her bilateral lateral malleolus ulcers. She has been tolerating the dressing changes without complication which is good news. With that being said we did have an x-ray performed on 07/12/18 which revealed a slight loosen see in the lateral portion of the distal left fibula which may represent artifact but underline lytic destruction or osteomyelitis could not be excluded. MRI was recommended. With that being said we can see about getting the patient scheduled for an MRI to further evaluate this area. In fact we have that scheduled currently for August 20 19,019. 07/26/18 on  evaluation today patient's wound on the right lateral ankle actually appears to be doing fairly well at this point in my pinion. She has made some good progress currently. With that being said unfortunately in regard to the left lateral ankle ulcer this seems to be a little bit more problematic at this time. In fact as I further evaluated the situation she actually had bone exposed which is the first time that's been the case in the bone appear to be necrotic. Currently I did review patient's note from Dr. Bunnie Domino office with [facility]. He stated that ABI was 1.26 on the right and 0.95 on the left with good waveforms. Her perfusion is stable not reduced from previous studies and her digital waveforms were pretty good particularly on the right. His conclusion upon review of the note was that there was not much she could do to improve her perfusion and he felt she was adequate for wound healing. His suggestion was that she continued to see [place] and consider a synthetic skin graft if there was no underlying infection. He plans to see her back in six months or as needed. 08/01/18 on evaluation today patient appears to be doing better in regard to her right lateral ankle ulcer. Her left lateral ankle ulcer is about the same she still has bone involvement in evidence of necrosis. There does not appear to be evidence of infection at this time On the right lateral lower extremity. I have started her on the Augmentin she picked this up and started this yesterday. This is to get her through until she sees infectious disease which is scheduled for 08/12/18. 08/06/18 on evaluation today patient appears to be doing rather well considering my discussion with patient's daughter at the end of last week. The area which was marked where she had erythema seems to be improved and this is good news. With that being said overall the patient seems to be making good improvement when it comes to the overall  appearance of the right lateral ankle  ulcer although this has been slow she at least is coming around in this regard. Unfortunately in regard to the left lateral ankle ulcer this is osteomyelitis based on the pathology report as well is bone culture. Nonetheless we are still waiting CT scan. Unfortunately the MRI we originally ordered cannot be performed as the patient is a pacemaker which I had overlooked. Nonetheless we are working on the CT scan approval and scheduling as of now. She did go to the hospital over the weekend and was placed on IV Cefzo for a couple of days. Fortunately this seems to have improved the erythema quite significantly which is good news. There does not appear to be any evidence of worsening infection at this time. She did have some bleeding after the last debridement therefore I did not perform any sharp debridement in regard to left lateral ankle at this point. Patient has been approved for a snap vac for the right lateral ankle. 08/14/18; the patient with wounds over her bilateral lateral malleoli. The area on the right actually looks quite good. Been using a snap back on this area. Healthy granulation and appears to be filling in. Unfortunately the area on the left is really problematic. 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 Also noted to have cellulitis. She saw Dr. Novella Olive of infectious disease today and was put on linezolid. We are able to verify this with her pharmacy. She is completed the Augmentin that she was already on. We've been using Iodoflex to this LAQUINDA, MOLLER ([id number]) area 08/23/18 on evaluation today patient's wounds both actually appear to be doing better compared to my prior evaluations. Fortunately she showing signs of good improvement in regard to the overall wound status especially where were using the snap vac on the right. In regard to left lateral  malleolus the wound bed actually appears to be much cleaner than previously noted. I do not feel any phone directly probed during evaluation today and though there is tendon noted this does not appear to be necrotic it's actually fairly good as far as the overall appearance of the tendon is concerned. In general the wound bed actually appears to be doing significantly better than it was previous. Patient is currently in the care of [name] and I did review that note today. He actually has her on two weeks of linezolid and then following the patient will be on 1-2 months of Keflex. That is the plan currently. She has been on antibiotics therapy as prescribed by myself initially starting on July 30, 2018 and has been on that continuously up to this point. 08/30/18 on evaluation today patient actually appears to be doing much better in regard to her right lateral malleolus ulcer. She has been tolerating the dressing changes specifically the snap vac without complication although she did have some issues with the seal currently. Apparently there was some trouble with getting it to maintain over the past week past Sunday. Nonetheless overall the wound appears better in regard to the right lateral malleolus region. In regard to left lateral malleolus this actually show some signs of additional granulation although there still tendon noted in the base of the wound this appears to be healthy not necrotic in any way whatsoever. We are considering potentially using a snap vac for the left lateral malleolus as well the product wrap from [employer], [place], was present in the clinic today we're going to  see this patient I did have her come in with me after obtaining consent from the patient and her daughter in order to look at the wound and see if there's any recommendation one way or another as to whether or not they felt the snapback could be beneficial for the left lateral malleolus region. But the conclusion  was that it might be but that this is definitely a little bit deeper wound than what traditionally would be utilized for a snap vac. 09/06/18 on evaluation today patient actually appears to be doing excellent in my pinion in regard to both ankle ulcers. She has been tolerating the dressing changes without complication which is great news. Specifically we have been using the snap vac. In regard to the right ankle I'm not even sure that this is going to be necessary for today and following as the wound has filled in quite nicely. In regard to the left ankle I do believe that we're seeing excellent epithelialization from the edge as well as granulation in the central portion the tendon is still exposed but there's no evidence of necrotic bone and in general I feel like the patient has made excellent progress even compared to last week with just one week of the snap vac. 09/11/18; this is a patient who has wounds on her bilateral lateral malleoli. Initially both of these were deep stage IV wounds in the setting of chronic arterial insufficiency. She has been revascularized. As I understand think she been using snap vacs to both of these wounds however the area on the right became more superficial and currently she is only using it on the left. Using silver collagen on the right and silver collagen under the back on the left I believe 09/19/18 on evaluation today patient actually appears to be doing very well in regard to her lateral malleolus or ulcers bilaterally. She has been tolerating the dressing changes without complication. Fortunately there does not appear to be any evidence of infection at this time. Overall I feel like she is improving in an excellent manner and I'm very pleased with the fact that everything seems to be turning towards the better for her. This has obviously been a long road. 09/27/18 on evaluation today patient actually appears to be doing very well in regard to her bilateral  lateral malleolus ulcers. She has been tolerating the dressing changes without complication. Fortunately there does not appear to be any evidence of infection at this time which is also great news. No fevers, chills, nausea, or vomiting noted at this time. Overall I feel like she is doing excellent with the snap vac on the left malleolus. She had 40 mL of fluid collection over the past week. 10/04/18 on evaluation today patient actually appears to be doing well in regard to her bilateral lateral malleolus ulcers. She continues to tolerate the dressing changes without complication. One issue that I see is the snap vac on the left lateral malleolus which appears to have sealed off some fluid underlying this area and has not really allowed it to heal to the degree that I would like to see. For that reason I did suggest at this point we may want to pack a small piece of packing strip into this region to allow it to more effectively wick out fluid. 10/11/18 in general the patient today does not feel that she has been doing very well. She's been a little bit lethargic and subsequently is having bodyaches as well according to what she  tells me today. With that being said overall she has been concerned with the fact that something may be worsening although to be honest her wounds really have not been appearing poorly. She does have a new ulcer on her left heel unfortunately. This may be pressure related. Nonetheless it seems to me to have potentially started at least as a blister I do not see any evidence of deep tissue injury. In regard to the left ankle the snap vac still seems to be causing the ceiling off of the deeper part of the wound which is in turn trapping fluid. I'm not extremely pleased with the overall appearance as far as progress from last week to this week therefore I'm gonna discontinue Branden, [name] ([id number]) the snap vac at this point. 10/18/18 patient unfortunately this point has  not been feeling well for the past several days. She was seen by Grayland Ormond her primary care provider who is a Librarian, academic at [facility]. Subsequently she states that she's been very weak and generally feeling malaise. No fevers, chills, nausea, or vomiting noted at this time. With that being said bloodwork was performed at the PCP office on the 11th of this month which showed a white blood cell count of 10.7. This was repeated today and shows a white blood cell count of 12.4. This does show signs of worsening. Coupled with the fact that she is feeling worse and that her left ankle wound is not really showing signs of improvement I feel like this is an indication that the osteomyelitis is likely exacerbating not improving. Overall I think we may also want to check her C-reactive protein and sedimentation rate. Actually did call [facility] office this afternoon while the patient was in the office here with me. Subsequently based on the findings we discussed treatment possibilities and I think that it is appropriate for [place] to go ahead and initiate treatment with doxycycline which I'm going to do. Subsequently he did agree to see about adding a CRP and sedimentation rate to her orders. If that has not already been drawn to where they can run it they will contact the patient she can come back to have that check. They are in agreement with plan as far as the patient and her daughter are concerned. Nonetheless also think we need to get in touch with Dr. Henreitta Leber office to see about getting the patient scheduled with him as soon as possible. 11/08/18 on evaluation today patient presents for follow-up concerning her bilateral foot and ankle ulcers. I did do an extensive review of her chart in epic today. Subsequently she was seen by [name] he did initiate Cefepime IV antibiotic therapy. Subsequently she had some issues with her PICC line this had to be removed because it was coiled  and then replaced. Fortunately that was now settled. Unfortunately she has continued have issues with her left heel as well as the issues that she is experiencing with her bilateral lateral malleolus regions. I do believe however both areas seem to be doing a little bit better on evaluation today which is good news. No fevers, chills, nausea, or vomiting noted at this time. She actually has an angiogram schedule with Dr. dew on this coming Monday, November 11, 2018. Subsequently the patient states that she is feeling much better especially than what she was roughly 2 weeks ago. She actually had to cancel an appointment because she was feeling so poorly. No fevers, chills, nausea, or vomiting noted at  this time. 11/15/18 on evaluation today patient actually is status post having had her angiogram with Dr. dew Monday, four days ago. 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 He had to go and work on several areas of the vasculature fortunately he was able to obtain no more than a 30% residual stenosis throughout post procedure. I reviewed this note today. I think this will definitely help with healing at this time. Fortunately there does not appear to be any signs of infection and I do feel like ratio already has a better appearance to it. 11/22/18 upon evaluation today patient actually appears to be doing very well in regard to her wounds in general. The right lateral malleolus looks excellent the heel looks better in the left lateral malleolus also appears to be doing a little better. With that being said the right second toe actually appears to be open and training we been watching this is been dry and stable but now is open. 12/03/2018 Seen today for follow-up and management of multiple bilateral lower extremity wounds. New pressure injury of the great toe which is closed at this time. Wound of the right distal second toe appears larger today with deep undermining and  a pocket of fluid present within the undermining region. Left and right malleolus is wounds are stable today with no signs and symptoms of infection.Denies any needs or concerns during exam today. 12/13/18 on evaluation today patient appears to be doing somewhat better in regard to her left heel ulcer. She also seems to be completely healed in regard to the right lateral malleolus ulcer. The left malleolus ulcer is smaller what unfortunately the wounds which are new over the first and second toes of the right foot are what are most concerning at this point especially the second. Both areas did require sharp debridement today. 12/20/18 on evaluation today patient's wound actually appears to be doing better in regard to left lateral ankle and her right lateral ankle continues to remain healed. The hill ulcer on the left is improved. She does have improvement noted as well in regard to both toe ulcers. Overall I'm very pleased in this regard. No fevers, chills, nausea, or vomiting noted at this time. 12/23/18 on evaluation today patient is seen after she had her toenails trimmed at the podiatrist office due to issues with her right great toe. There was what appeared to be dark eschar on the surface of the wound which had her in the podiatrist concerned. Nonetheless as I remember that during the last office visit I had utilize silver nitrate of this area I was much less concerned about the situation. Subsequently I was able to clean off much of this tissue without any complication today. This does not appear to show any signs of infection and actually look somewhat better compared to last time post debridement. Her second toe on the right foot actually had callous over and there did appear still be some fluid underneath this that would require debridement today. YERALDI, FIDLER ([id number]) 12/27/18 on evaluation today patient actually appears to be showing signs of improvement at all locations. Even  the left lateral ankle although this is not quite as great as the other sites. Fortunately there does not appear to be any signs of infection at this time and both of her toes on the right foot seem to be showing signs of improvement which is good news and very pleased in this regard. 01/03/19 on evaluation  today patient appears to be doing better for the most part in regard to her wounds in particular. There does not appear to be any evidence of infection at this time which is good news. Fortunately there is no sign of really worsening anywhere except for the right great toe which she does have what appears to be a bruise/deep tissue injury which is very superficial and already resolving. I'm not sure where this came from I questioned her extensively and she does not recall what may have happened with this. Other than that the patient seems to be doing well even the left lateral ankle ulcer looks good and is getting smaller. Patient History Information obtained from Patient. Family History Cancer - Father,Siblings, Heart Disease - Siblings, No family history of Diabetes, Hereditary Spherocytosis, Hypertension, Kidney Disease, Lung Disease, Seizures, Stroke, Thyroid Problems, Tuberculosis. Social History Never smoker, Marital Status - Married, Alcohol Use - Never, Drug Use - No History, Caffeine Use - Rarely. Medical History Eyes Patient has history of Cataracts - surgery Cardiovascular Patient has history of Congestive Heart Failure, Hypertension Musculoskeletal Patient has history of Osteoarthritis Neurologic Patient has history of Neuropathy Oncologic Denies history of Received Chemotherapy, Received Radiation Review of Systems (ROS) Constitutional Symptoms (General Health) Denies complaints or symptoms of Fever, Chills. Respiratory The patient has no complaints or symptoms. Cardiovascular The patient has no complaints or symptoms. Psychiatric The patient has no complaints or  symptoms. Objective Constitutional Well-nourished and well-hydrated in no acute distress. Vitals Time Taken: 10:55 AM, Height: 65 in, Weight: 154.3 lbs, BMI: 25.7, Temperature: 98.1 F, Pulse: 99 bpm, Respiratory [name], [name]. ([id number]) Rate: 18 breaths/min, Blood Pressure: 118/59 mmHg. Respiratory normal breathing without difficulty. Psychiatric this patient is able to make decisions and demonstrates good insight into disease process. Alert and Oriented x 3. pleasant and cooperative. General Notes: Patient's wounds did require sharp debridement in all locations today. She tolerated this without any pain fortunately bleeding was stopped with pressure in regard to the right great toe the use of a silver alginate dressing. Overall she tolerated the procedures today without complication everything appears to be doing much better post debridement. Integumentary (Hair, Skin) Wound #2 status is Open. Original cause of wound was Gradually Appeared. The wound is located on the Left,Lateral Malleolus. The wound measures 0.8cm length x 0.7cm width x 0.3cm depth; 0.44cm^2 area and 0.132cm^3 volume. There is Fat Layer (Subcutaneous Tissue) Exposed exposed. There is no undermining noted, however, there is tunneling at 5:00 with a maximum distance of 2cm. There is a medium amount of serous drainage noted. The wound margin is epibole. There is large (67-100%) pink granulation within the wound bed. There is a small (1-33%) amount of necrotic tissue within the wound bed including Adherent Slough. The periwound skin appearance exhibited: Maceration, Hemosiderin Staining. The periwound skin appearance did not exhibit: Callus, Crepitus, Excoriation, Induration, Rash, Scarring, Dry/Scaly, Atrophie Blanche, Cyanosis, Ecchymosis, Mottled, Pallor, Rubor, Erythema. Periwound temperature was noted as No Abnormality. The periwound has tenderness on palpation. Wound #3 status is Open. Original cause of wound  was Gradually Appeared. The wound is located on the Left Calcaneus. The wound measures 0.9cm length x 1.4cm width x 0.1cm depth; 0.99cm^2 area and 0.099cm^3 volume. There is Fat Layer (Subcutaneous Tissue) Exposed exposed. There is no tunneling or undermining noted. There is a small amount of serous drainage noted. The wound margin is indistinct and nonvisible. There is no granulation within the wound bed. There is a medium (34-66%) amount of necrotic tissue  within the wound bed including Adherent Slough. The periwound skin appearance exhibited: Maceration. The periwound skin appearance did not exhibit: Callus, Crepitus, Excoriation, Induration, Rash, Scarring, Dry/Scaly, Atrophie Blanche, Cyanosis, Ecchymosis, Hemosiderin Staining, Mottled, Pallor, Rubor, Erythema. Periwound temperature was noted as No Abnormality. The periwound has tenderness on palpation. Wound #4 status is Open. Original cause of wound was Pressure Injury. The wound is located on the Right,Distal Toe Second. The wound measures 0.1cm length x 0.1cm width x 0.1cm depth; 0.008cm^2 area and 0.001cm^3 volume. There is Fat Layer (Subcutaneous Tissue) Exposed exposed. There is no tunneling or undermining noted. There is a none present amount of drainage noted. There is no granulation within the wound bed. There is a large (67-100%) amount of necrotic tissue within the wound bed including Eschar. The periwound skin appearance did not exhibit: Callus, Crepitus, Excoriation, Induration, Rash, Scarring, Dry/Scaly, Maceration, Atrophie Blanche, Cyanosis, Ecchymosis, Hemosiderin Staining, Mottled, Pallor, Rubor, Erythema. Wound #5 status is Open. Original cause of wound was Footwear Injury. The wound is located on the Right,Distal [employer] The wound measures 0.3cm length x 0.4cm width x 0.1cm depth; 0.094cm^2 area and 0.009cm^3 volume. There is Fat Layer (Subcutaneous Tissue) Exposed exposed. There is no tunneling or undermining noted.  There is a none present amount of drainage noted. There is no granulation within the wound bed. There is a small (1-33%) amount of necrotic tissue within the wound bed including Eschar. 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 There was a Excisional Skin/Subcutaneous Tissue Debridement with a total area of 0.56 sq cm performed by STONE III, Jalicia Roszak E., PA-C. With the following instrument(s): Curette to remove Viable and Non-Viable tissue/material. Material removed includes Subcutaneous Tissue and Slough and after achieving pain control using Lidocaine 4% Topical Solution. No specimens were taken. A time out was conducted at 11:31, prior to the start of the procedure. A Minimum amount of bleeding was controlled with Pressure. The procedure was tolerated well with a pain level of 0 throughout and a pain level of 0 following the procedure. Post Debridement Measurements: 0.8cm length x 0.7cm width x 0.4cm depth; 0.176cm^3 volume. Post debridement Stage noted as Category/Stage IV. Character of Wound/Ulcer Post Debridement is improved. Severity of Tissue Post Debridement is: Fat layer exposed. Post procedure Diagnosis Wound #2: Same as Pre-Procedure Wound #3 Pre-procedure diagnosis of Wound #3 is a Pressure Ulcer located on the Left Calcaneus . There was a Excisional Skin/Subcutaneous Tissue Debridement  with a total area of 1.26 sq cm performed by STONE III, Nyeisha Goodall E., PA-C. With the following instrument(s): Curette to remove Viable and Non-Viable tissue/material. Material removed includes Subcutaneous Tissue and Slough and after achieving pain control using Lidocaine 4% Topical Solution. No specimens were taken. A time out was conducted at 11:33, prior to the start of the procedure. A Minimum amount of bleeding was controlled with Pressure. The procedure was tolerated well with a pain level of 0 throughout and a pain level of 0 following the procedure. Post  Debridement Measurements: 0.9cm length x 1.4cm width x 0.2cm depth; 0.198cm^3 volume. Post debridement Stage noted as Category/Stage II. Character of Wound/Ulcer Post Debridement is improved. Post procedure Diagnosis Wound #3: Same as Pre-Procedure Wound #4 Pre-procedure diagnosis of Wound #4 is a Pressure Ulcer located on the Right,Distal Toe Second . There was a Excisional Skin/Subcutaneous Tissue Debridement with a total area of 0.01 sq cm performed by STONE III, Lizett Chowning E., PA-C. With the following instrument(s): Curette to remove Viable and Non-Viable tissue/material. Material removed includes Callus, Subcutaneous Tissue, and Slough after achieving pain control using Lidocaine 4% Topical Solution. No specimens were taken. A time out was conducted at 11:25, prior to the start of the procedure. A Minimum amount of bleeding was controlled with Pressure. The procedure was tolerated well with a pain level of 0 throughout and a pain level of 0 following the procedure. Post Debridement Measurements: 0.3cm length x 0.4cm width x 0.3cm depth; 0.028cm^3 volume. Post debridement Stage noted as Category/Stage II. Character of Wound/Ulcer Post Debridement is improved. Post procedure Diagnosis Wound #4: Same as Pre-Procedure Wound #5 Pre-procedure diagnosis of Wound #5 is a Pressure Ulcer located on the Right,Distal Toe Great . There was a  Excisional Skin/Subcutaneous Tissue Debridement with a total area of 0.12 sq cm performed by STONE III, Yusuf Yu E., PA-C. With the following instrument(s): Curette to remove Viable and Non-Viable tissue/material. Material removed includes Callus, Subcutaneous Tissue, and Slough after achieving pain control using Lidocaine 4% Topical Solution. No specimens were CRYSTALINA, STODGHILL ([id number]) taken. A time out was conducted at 11:29, prior to the start of the procedure. A Minimum amount of bleeding was controlled with Pressure. The procedure was tolerated well with a pain level of 0 throughout and a pain level of 0 following the procedure. Post Debridement Measurements: 0.3cm length x 0.4cm width x 0.2cm depth; 0.019cm^3 volume. Post debridement Stage noted as Category/Stage II. Character of Wound/Ulcer Post Debridement is improved. Post procedure Diagnosis Wound #5: Same as Pre-Procedure Plan Wound Cleansing: Wound #2 Left,Lateral Malleolus: Clean wound with Normal Saline. Cleanse wound with mild soap and water May Shower, gently pat wound dry prior to applying new dressing. Wound #3 Left Calcaneus: Clean wound with Normal Saline. Cleanse wound with mild soap and water May Shower, gently pat wound dry prior to applying new dressing. Wound #4 Right,Distal Toe Second: Clean wound with Normal Saline. Cleanse wound with mild soap and water May Shower, gently pat wound dry prior to applying new dressing. Wound #5 Right,Distal Toe Great: Clean wound with Normal Saline. Cleanse wound with mild soap and water May Shower, gently pat wound dry prior to applying new dressing. Anesthetic (add to Medication List): Wound #2 Left,Lateral Malleolus: Topical Lidocaine 4% cream applied to wound bed prior to debridement (In Clinic Only). Wound #3 Left Calcaneus: Topical Lidocaine 4% cream applied to wound bed prior to debridement (In Clinic Only). Wound #4 Right,Distal Toe Second: Topical Lidocaine 4%  cream applied to wound bed prior to debridement (In Clinic Only). Wound #5 Right,Distal Toe Great: Topical Lidocaine 4% cream applied to wound bed prior to debridement (In Clinic Only). Primary Wound Dressing: Wound #2 Left,Lateral Malleolus: Other: - SNAP VAC with prisma to tunnel Wound #3 Left Calcaneus: Silver Collagen Wound #4 Right,Distal Toe Second: Silver Alginate - with mupirocin to wound bed Wound #5 Right,Distal Toe Great: Silver Alginate - with mupirocin to wound bed Secondary Dressing: Wound #3 Left Calcaneus: Boardered Foam Dressing - Please keep a bordered foam dressing on the right  malleolus with bactroban Wound #4 Right,Distal Toe Second: Conform/Kerlix Wound #5 Right,Distal Toe Great: Conform/Kerlix Dressing Change Frequency: Wound #2 Left,Lateral Malleolus: Change Dressing Monday, Wednesday, Friday SEYNABOU, FULTS ([id number]) Wound #3 Left Calcaneus: Change Dressing Monday, Wednesday, Friday Wound #4 Right,Distal Toe Second: Change Dressing Monday, Wednesday, Friday Wound #5 Right,Distal Toe Great: Change Dressing Monday, Wednesday, Friday Follow-up Appointments: Wound #2 Left,Lateral Malleolus: Return Appointment in 1 week. Wound #3 Left Calcaneus: Return Appointment in 1 week. Wound #4 Right,Distal Toe Second: Return Appointment in 1 week. Wound #5 Right,Distal Toe Great: Return Appointment in 1 week. Edema Control: Wound #2 Left,Lateral Malleolus: Patient to wear own compression stockings Patient to wear own Velcro compression garment. Elevate legs to the level of the heart and pump ankles as often as possible Wound #3 Left Calcaneus: Patient to wear own compression stockings Patient to wear own Velcro compression garment. Elevate legs to the level of the heart and pump ankles as often as possible Wound #4 Right,Distal Toe Second: Patient to wear own compression stockings Patient to wear own Velcro compression garment. Elevate legs to the  level of the heart and pump ankles as often as possible Wound #5 Right,Distal Toe Great: Patient to wear own compression stockings Patient to wear own Velcro compression garment. Elevate legs to the level of the heart and pump ankles as often as possible Off-Loading: Wound #2 Left,Lateral Malleolus: Turn and reposition every 2 hours Other: - do not put pressure on your ankles Wound #3 Left Calcaneus: Turn and reposition every 2 hours Other: - do not put pressure on your ankles Wound #4 Right,Distal Toe Second: Turn and reposition every 2 hours Other: - do not put pressure on your ankles Wound #5 Right,Distal Toe Great: Turn and reposition every 2 hours Other: - do not put pressure on your ankles Additional Orders / Instructions: Wound #2 Left,Lateral Malleolus: Increase protein intake. Increase protein intake. Wound #3 Left Calcaneus: Increase protein intake. Increase protein intake. Wound #4 Right,Distal Toe Second: Increase protein intake. Increase protein intake. Wound #5 Right,Distal Toe Great: Increase protein intake. Increase protein intake. Home Health: Wound #3 Left Calcaneus: Consuela, Widener [name] ([id number]) [place] [facility] - [facility]. Please visit patient Monday, Wednesday, and Friday Home Health Nurse may visit PRN to address patient s wound care needs. FACE TO FACE ENCOUNTER: MEDICARE and MEDICAID PATIENTS: I certify that this patient is under my care and that I had a face-to-face encounter that meets the physician face-to-face encounter requirements with this patient on this date. The encounter with the patient was in whole or in part for the following MEDICAL CONDITION: (primary reason for [place]) MEDICAL NECESSITY: I certify, that based on my findings, NURSING services are a medically necessary home health service. HOME BOUND STATUS: I certify that my clinical findings support that this patient is homebound (i.e., Due to illness or injury,  pt requires aid of supportive devices such as crutches, cane, wheelchairs, walkers, the use of special transportation or the assistance of another person to leave their place of residence. There is a normal inability to leave the home and doing so requires considerable and taxing effort. Other absences are for medical reasons / religious services and are infrequent or of short duration when for other reasons). If current dressing causes regression in wound condition, may D/C ordered dressing product/s and apply Normal Saline Moist Dressing daily until next [place] / Other MD appointment. [place] of regression in wound condition at [phone]. Please direct  any NON-WOUND related issues/requests for orders to patient's Primary Care Physician Wound #4 Right,Distal Toe Second: [place] [facility] - [facility]. Please visit patient Monday, Wednesday, and Friday Home Health Nurse may visit PRN to address patient s wound care needs. FACE TO FACE ENCOUNTER: MEDICARE and MEDICAID PATIENTS: I certify that this patient is under my care and that I had a face-to-face encounter that meets the physician face-to-face encounter requirements with this patient on this date. The encounter with the patient was in whole or in part for the following MEDICAL CONDITION: (primary reason for [place]) MEDICAL NECESSITY: I certify, that based on my findings, NURSING services are a medically necessary home health service. HOME BOUND STATUS: I certify that my clinical findings support that this patient is homebound (i.e., Due to illness or injury, pt requires aid of supportive devices such as crutches, cane, wheelchairs, walkers, the use of special transportation or the assistance of another person to leave their place of residence. There is a normal inability to leave the home and doing so requires considerable and taxing effort. Other absences are for medical reasons / religious  services and are infrequent or of short duration when for other reasons). If current dressing causes regression in wound condition, may D/C ordered dressing product/s and apply Normal Saline Moist Dressing daily until next [place] / Other MD appointment. [place] of regression in wound condition at [phone]. Please direct any NON-WOUND related issues/requests for orders to patient's Primary Care Physician Wound #5 Right,Distal Toe Great: [place] [facility]its - [facility]. Please visit patient Monday, Wednesday, and Friday Home Health Nurse may visit PRN to address patient s wound care needs. FACE TO FACE ENCOUNTER: MEDICARE and MEDICAID PATIENTS: I certify that this patient is under my care and that I had a face-to-face encounter that meets the physician face-to-face encounter requirements with this patient on this date. The encounter with the patient was in whole or in part for the following MEDICAL CONDITION: (primary reason for [place]) MEDICAL NECESSITY: I certify, that based on my findings, NURSING services are a medically necessary home health service. HOME BOUND STATUS: I certify that my clinical findings support that this patient is homebound (i.e., Due to illness or injury, pt requires aid of supportive devices such as crutches, cane, wheelchairs, walkers, the use of special transportation or the assistance of another person to leave their place of residence. There is a normal inability to leave the home and doing so requires considerable and taxing effort. Other absences are for medical reasons / religious services and are infrequent or of short duration when for other reasons). If current dressing causes regression in wound condition, may D/C ordered dressing product/s and apply Normal Saline Moist Dressing daily until next [place] / Other MD appointment. [place] of regression in wound condition at  [phone]. Please direct any NON-WOUND related issues/requests for orders to patient's Primary Care Physician My suggestion currently is going to be that we go ahead and initiate the above wound care measures for the next week. Patient is in agreement the plan. We will subsequently see were things stand at follow-up. Hopefully she will continue to show signs of improvement. Please see above for specific wound care orders. We will see patient for re-evaluation in 1 week(s) here in the clinic. If anything worsens or changes patient will contact our office for additional recommendations. KAMRYNN, MELOTT ([id number]) Electronic Signature(s) Signed: 01/03/2019 5:12:31 PM By: Melburn Hake,  Dakayla Disanti PA-C Entered By: Worthy Keeler on 01/03/2019 12:32:01 Mansel, [name] ([id number]) -------------------------------------------------------------------------------- ROS/PFSH Details Patient Name: [name] Date of Service: 01/03/2019 10:30 AM Medical Record Number: [id number] Patient Account Number: [id number] Date of Birth/Sex: 17-May-1923 (85 y.o. F) Treating RN: Montey Hora Primary Care Provider: Grayland Ormond Other Clinician: Referring Provider: Grayland Ormond Treating Provider/Extender: Melburn Hake, Jailyn Langhorst Weeks in Treatment: 96 Information Obtained From Patient Wound History Do you currently have one or more open woundso Yes How many open wounds do you currently haveo 1 Approximately how long have you had your woundso 2 yrs How have you been treating your wound(s) until nowo mupirocin, soaking in epsom salt Has your wound(s) ever healed and then re-openedo No Have you had any lab work done in the past montho No Have you tested positive for an antibiotic resistant organism (MRSA, VRE)o No Have you tested positive for osteomyelitis (bone infection)o No Have you had any tests for circulation on your legso Yes Who ordered the testo Dr. Lucky Cowboy Where was the test doneo  [facility] Constitutional Symptoms (General Health) Complaints and Symptoms: Negative for: Fever; Chills Eyes Medical History: Positive for: Cataracts - surgery Respiratory Complaints and Symptoms: No Complaints or Symptoms Cardiovascular Complaints and Symptoms: No Complaints or Symptoms Medical History: Positive for: Congestive Heart Failure; Hypertension Musculoskeletal Medical History: Positive for: Osteoarthritis Neurologic Medical History: Positive for: Neuropathy KIALEE, KHAM ([id number]) Oncologic Medical History: Negative for: Received Chemotherapy; Received Radiation Psychiatric Complaints and Symptoms: No Complaints or Symptoms HBO Extended History Items Eyes: Cataracts Immunizations Pneumococcal Vaccine: Received Pneumococcal Vaccination: Yes Implantable Devices Family and Social History Cancer: Yes - Father,Siblings; Diabetes: No; Heart Disease: Yes - Siblings; Hereditary Spherocytosis: No; Hypertension: No; Kidney Disease: No; Lung Disease: No; Seizures: No; Stroke: No; Thyroid Problems: No; Tuberculosis: No; Never smoker; Marital Status - Married; Alcohol Use: Never; Drug Use: No History; Caffeine Use: Rarely; Financial Concerns: No; Food, Clothing or Shelter Needs: No; Support System Lacking: No; Transportation Concerns: No; Advanced Directives: No; Patient does not want information on Advanced Directives; Do not resuscitate: No; Living Will: Yes (Not Provided); Medical Power of Attorney: No Physician Affirmation I have reviewed and agree with the above information. Electronic Signature(s) Signed: 01/03/2019 5:12:31 PM By: Worthy Keeler PA-C Signed: 01/03/2019 5:16:27 PM By: Montey Hora Entered By: Worthy Keeler on 01/03/2019 12:31:10 Is[place]e, [name] ([id number]) -------------------------------------------------------------------------------- SuperBill Details Patient Name: [name] Date of Service: 01/03/2019 Medical Record  Number: [id number] Patient Account Number: [id number] Date of Birth/Sex: 1923-06-01 (85 y.o. 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 I70.243 Atherosclerosis of native arteries of left leg with ulceration of ankle I89.0 Lymphedema, not elsewhere classified B35.4 Tinea corporis 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, left ankle and foot Facility Procedures CPT4 Code: [id number] Description: 10626 - DEB SUBQ TISSUE 20 SQ CM/< ICD-10 Diagnosis Description L89.524 Pressure ulcer of left ankle, stage 4 L89.623 Pressure ulcer of left heel, stage 3 L89.892 Pressure ulcer of other site, stage 2 Modifier: Quantity: 1 Physician Procedures CPT4 Code: [id number] Description: 11042 - WC PHYS SUBQ TISS 20 SQ CM ICD-10 Diagnosis Description L89.524 Pressure  ulcer of left ankle, stage 4 L89.623 Pressure ulcer of left heel, stage 3 L89.892 Pressure ulcer of other site, stage 2 Modifier: Quantity: 1 Electronic Signature(s) Signed: 01/03/2019 5:12:31 PM By: Worthy Keeler PA-C Entered By: Worthy Keeler on 01/03/2019 12:32:27

## 2019-01-05 NOTE — Progress Notes (Signed)
[name], [name] ([id number]) Visit Report for 01/03/2019 Arrival Information Details Patient Name: [name], [name] Date of Service: 01/03/2019 10:30 AM Medical Record Number: [id number] Patient Account Number: [id number] Date of Birth/Sex: [birth year] (85 y.o. F) Treating RN: Secundino Ginger Primary Care Micaylah Bertucci: Grayland Ormond Other Clinician: Referring Rinoa Garramone: Grayland Ormond Treating Shadee Rathod/Extender: Melburn Hake, HOYT Weeks in Treatment: 47 Visit Information History Since Last Visit Added or deleted any medications: No Patient Arrived: Walker Any new allergies or adverse reactions: No Arrival Time: 10:54 Had a fall or experienced change in No Accompanied By: daughter activities of daily living that may affect Transfer Assistance: None risk of falls: Patient Identification Verified: Yes Signs or symptoms of abuse/neglect since last visito No Secondary Verification Process Yes Hospitalized since last visit: No Completed: Implantable device outside of the clinic excluding No Patient Requires Transmission-Based No cellular tissue based products placed in the center Precautions: since last visit: Patient Has Alerts: Yes Has Dressing in Place as Prescribed: Yes Patient Alerts: ABI AVVS 03/29/18 L Pain Present Now: No 1.05 R 1.03, TBI L .56, R .85 ABI AVVS 07/23/18 L .95 R 1.26 Electronic Signature(s) Signed: 01/03/2019 11:55:35 AM By: Secundino Ginger Entered By: Secundino Ginger on 01/03/2019 10:54:49 Eilert, [name] ([id number]) -------------------------------------------------------------------------------- Encounter Discharge Information Details Patient Name: [name] Date of Service: 01/03/2019 10:30 AM Medical Record Number: [id number] Patient Account Number: [id number] Date of Birth/Sex: May 12, 1923 (85 y.o. F) Treating RN: Montey Hora Primary Care Alven Alverio: Grayland Ormond Other Clinician: Referring Garek Schuneman: Grayland Ormond Treating Jennfer Gassen/Extender: Melburn Hake, HOYT Weeks in Treatment: 23 Encounter Discharge Information Items Post Procedure Vitals Discharge Condition: Stable Temperature (F): 98.1 Ambulatory Status: Walker Pulse (bpm): 99 Discharge Destination: Home Respiratory Rate (breaths/min): 18 Transportation: Private Auto Blood Pressure (mmHg): 118/59 Accompanied By: daughter Schedule Follow-up Appointment: Yes Clinical Summary of Care: Electronic Signature(s) Signed: 01/03/2019 5:16:27 PM By: Montey Hora Entered By: Montey Hora on 01/03/2019 11:41:48 Gr[place]ow, [name] ([id number]) -------------------------------------------------------------------------------- Lower Extremity Assessment Details Patient Name: [name] Date of Service: 01/03/2019 10:30 AM Medical Record Number: [id number] Patient Account Number: [id number] Date of Birth/Sex: 1923-04-09 (85 y.o. F) Treating RN: Secundino Ginger Primary Care Hiral Lukasiewicz: Grayland Ormond Other Clinician: Referring Hailei Besser: Grayland Ormond Treating Nijah Orlich/Extender: Melburn Hake, HOYT Weeks in Treatment: 73 Edema Assessment Assessed: [Left: No] [Right: No] [Left: Edema] [Right: :] Calf Left: Right: Point of Measurement: 22 cm From Medial Instep 27 cm 23 cm Ankle Left: Right: Point of Measurement: 12 cm From Medial Instep 21 cm 20 cm Vascular Assessment Claudication: Claudication Assessment [Left:None] [Right:None] Pulses: Dorsalis Pedis Palpable: [Left:Yes] [Right:Yes] Posterior Tibial Extremity colors, hair growth, and conditions: Extremity Color: [Left:Hyperpigmented] [Right:Hyperpigmented] Hair Growth on Extremity: [Left:Yes] [Right:Yes] Temperature of Extremity: [Left:Warm] [Right:Warm] Capillary Refill: [Left:< 3 seconds] [Right:< 3 seconds] Toe Nail Assessment Left: Right: Thick: Yes Yes Discolored: Yes Yes Deformed: No No Improper Length and Hygiene: No No Electronic Signature(s) Signed: 01/03/2019 11:55:35 AM By: Secundino Ginger Entered By: Secundino Ginger on  01/03/2019 11:12:25 Berringer, [name] ([id number]) -------------------------------------------------------------------------------- Multi Wound Chart Details Patient Name: [name] Date of Service: 01/03/2019 10:30 AM Medical Record Number: [id number] Patient Account Number: [id number] Date of Birth/Sex: 01/26/1923 (85 y.o. F) Treating RN: Montey Hora Primary Care Jeraldean Wechter: Grayland Ormond Other Clinician: Referring Eilidh Marcano: Grayland Ormond Treating Ainslee Sou/Extender: STONE III, HOYT Weeks in Treatment: 73 Vital Signs Height(in): 65 Pulse(bpm): 99 Weight(lbs): 154.3 Blood Pressure(mmHg): 118/59 Body Mass Index(BMI): 26 Temperature(F): 98.1 Respiratory Rate 18 (breaths/min): Photos: [2:No Photos] [3:No Photos] [4:No Photos] Wound Location: [2:Left  Malleolus - Lateral] [3:Left Calcaneus] [4:Right Toe Second - Distal] Wounding Event: [2:Gradually Appeared] [3:Gradually Appeared] [4:Pressure Injury] 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] [3:Pressure Ulcer] [4:Pressure Ulcer] 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] [3:N/A] [4:N/A] Comorbid History: [2:Cataracts, Congestive Heart Failure, Hypertension, Osteoarthritis, Neuropathy] [3:Cataracts, Congestive Heart Failure, Hypertension, Osteoarthritis, Neuropathy] [4:Cataracts, Congestive Heart Failure, Hypertension, Osteoarthritis,  Neuropathy] Date Acquired: [2:04/12/2018] [3:10/04/2018] [4:10/23/2018] Weeks of Treatment: [2:37] [3:12] [4:6] Wound Status: [2:Open] [3:Open] [4:Open] Measurements L x W x D [2:0.8x0.7x0.3] [3:0.9x1.4x0.1] [4:0.1x0.1x0.1] (cm) Area (cm) : [2:0.44] [3:0.99] [4:0.008] Volume (cm) : [2:0.132] [3:0.099] [4:0.001] % Reduction in Area: [2:43.90%] [3:64.00%] [4:88.70%] % Reduction in Volume: [2:-67.10%] [3:82.00%] [4:85.70%] Classification: [2:Category/Stage IV] [3:Category/Stage II] [4:Category/Stage II] Exudate Amount: [2:Medium] [3:Small] [4:None Present] Exudate Type: [2:Serous]  [3:Serous] [4:N/A] Exudate Color: [2:amber] [3:amber] [4:N/A] Wound Margin: [2:Epibole] [3:Indistinct, nonvisible] [4:N/A] Granulation Amount: [2:Large (67-100%)] [3:None Present (0%)] [4:None Present (0%)] Granulation Quality: [2:Pink] [3:N/A] [4:N/A] Necrotic Amount: [2:Small (1-33%)] [3:Medium (34-66%)] [4:Large (67-100%)] Necrotic Tissue: [2:Adherent Slough] [3:Adherent Slough] [4:Eschar] Exposed Structures: [2:Fat Layer (Subcutaneous Tissue) Exposed: Yes Fascia: No Tendon: No Muscle: No Joint: No Bone: No] [3:Fat Layer (Subcutaneous Tissue) Exposed: Yes Fascia: No Tendon: No Muscle: No Joint: No Bone: No] [4:Fat Layer (Subcutaneous Tissue) Exposed: Yes  Fascia: No Tendon: No Muscle: No Joint: No Bone: No] Epithelialization: [2:None] [3:None] [4:N/A] Periwound Skin Texture: [2:Excoriation: No Induration: No Callus: No] [3:Excoriation: No Induration: No Callus: No] [4:Excoriation: No Induration: No Callus: No] Crepitus: No Crepitus: No Crepitus: No Rash: No Rash: No Rash: No Scarring: No Scarring: No Scarring: No Periwound Skin Moisture: Maceration: Yes Maceration: Yes Maceration: No Dry/Scaly: No Dry/Scaly: No Dry/Scaly: No Periwound Skin Color: Hemosiderin Staining: Yes Atrophie Blanche: No Atrophie Blanche: No Atrophie Blanche: No Cyanosis: No Cyanosis: No Cyanosis: No Ecchymosis: No Ecchymosis: No Ecchymosis: No Erythema: No Erythema: No Erythema: No Hemosiderin Staining: No Hemosiderin Staining: No Mottled: No Mottled: No Mottled: No Pallor: No Pallor: No Pallor: No Rubor: No Rubor: No Rubor: No Temperature: No Abnormality No Abnormality N/A Tenderness on Palpation: Yes Yes No Wound Preparation: Ulcer Cleansing: Ulcer Cleansing: Ulcer Cleansing: Rinsed/Irrigated with Saline Rinsed/Irrigated with Saline Rinsed/Irrigated with Saline Topical Anesthetic Applied: Topical Anesthetic Applied: Topical Anesthetic Applied: Other: lidocaine 4% Other:  lidocaine 4% Other: lidocaine 4% Wound Number: 5 N/A N/A Photos: No Photos N/A N/A Wound Location: Right Toe Great - Distal N/A N/A Wounding Event: Footwear Injury N/A N/A Primary Etiology: Pressure Ulcer N/A N/A Secondary Etiology: N/A N/A N/A Comorbid History: Cataracts, Congestive Heart N/A N/A Failure, Hypertension, Osteoarthritis, Neuropathy Date Acquired: 12/03/2018 N/A N/A Weeks of Treatment: 4 N/A N/A Wound Status: Open N/A N/A Measurements L x W x D 0.3x0.4x0.1 N/A N/A (cm) Area (cm) : 0.094 N/A N/A Volume (cm) : 0.009 N/A N/A % Reduction in Area: 73.40% N/A N/A % Reduction in Volume: 74.30% N/A N/A Classification: Category/Stage II N/A N/A Exudate Amount: None Present N/A N/A Exudate Type: N/A N/A N/A Exudate Color: N/A N/A N/A Wound Margin: N/A N/A N/A Granulation Amount: None Present (0%) N/A N/A Granulation Quality: N/A N/A N/A Necrotic Amount: Small (1-33%) N/A N/A Necrotic Tissue: Eschar N/A N/A Exposed Structures: Fat Layer (Subcutaneous N/A N/A Tissue) Exposed: Yes Fascia: No Tendon: No Muscle: No Joint: No Bone: No Epithelialization: N/A N/A N/A Periwound Skin Texture: No Abnormalities Noted N/A N/A Jowanna, Loeffler [name] ([id number]) Periwound Skin Moisture: No Abnormalities Noted N/A N/A Periwound Skin Color: No Abnormalities Noted N/A N/A Temperature: N/A N/A N/A Tenderness on Palpation: No N/A N/A Wound Preparation: Ulcer Cleansing: N/A N/A Rinsed/Irrigated  with Saline Topical Anesthetic Applied: Other: lidocaine 4% Treatment Notes Electronic Signature(s) Signed: 01/03/2019 5:16:27 PM By: Montey Hora Entered By: Montey Hora on 01/03/2019 11:19:25 [place], [name] ([id number]) -------------------------------------------------------------------------------- [place] Details Patient Name: [name] Date of Service: 01/03/2019 10:30 AM Medical Record Number: [id number] Patient Account Number:  [id number] Date of Birth/Sex: Mar 03, 1923 (85 y.o. F) Treating RN: Montey Hora Primary Care Imya Mance: Grayland Ormond Other Clinician: Referring Bayan Kushnir: Grayland Ormond Treating Skarlette Lattner/Extender: Melburn Hake, HOYT Weeks in Treatment: 67 Active Inactive Abuse / Safety / Falls / Self Care Management Nursing Diagnoses: Potential for falls Goals: Patient will not experience any injury related to falls Date Initiated: 08/10/2017 Target Resolution Date: 11/10/2017 Goal Status: Active Interventions: Assess Activities of Daily Living upon admission and as needed Assess fall risk on admission and as needed Assess: immobility, friction, shearing, incontinence upon admission and as needed Notes: Nutrition Nursing Diagnoses: Imbalanced nutrition Potential for alteratiion in Nutrition/Potential for imbalanced nutrition Goals: Patient/caregiver agrees to and verbalizes understanding of need to use nutritional supplements and/or vitamins as prescribed Date Initiated: 08/10/2017 Target Resolution Date: 12/08/2017 Goal Status: Active Interventions: Assess patient nutrition upon admission and as needed per policy Notes: Orientation to the Wound Care Program Nursing Diagnoses: Knowledge deficit related to the wound healing center program Goals: Patient/caregiver will verbalize understanding of the [facility] Date Initiated: 08/10/2017 Target Resolution Date: 09/08/2017 Goal Status: Active HAYLO, [name] ([id number]) Interventions: Provide education on orientation to the wound center Notes: Pain, Acute or Chronic Nursing Diagnoses: Pain, acute or chronic: actual or potential Potential alteration in comfort, pain Goals: Patient/caregiver will verbalize adequate pain control between visits Date Initiated: 08/10/2017 Target Resolution Date: 12/08/2017 Goal Status: Active Interventions: Complete pain assessment as per visit requirements Notes: Wound/Skin Impairment Nursing  Diagnoses: Impaired tissue integrity Knowledge deficit related to ulceration/compromised skin integrity Goals: Ulcer/skin breakdown will have a volume reduction of 80% by week 12 Date Initiated: 08/10/2017 Target Resolution Date: 12/01/2017 Goal Status: Active Interventions: Assess patient/caregiver ability to perform ulcer/skin care regimen upon admission and as needed Notes: Electronic Signature(s) Signed: 01/03/2019 5:16:27 PM By: Montey Hora Entered By: Montey Hora on 01/03/2019 11:19:18 [place], [name] ([id number]) -------------------------------------------------------------------------------- Pain Assessment Details Patient Name: [name] Date of Service: 01/03/2019 10:30 AM Medical Record Number: [id number] Patient Account Number: [id number] Date of Birth/Sex: [birth year] (85 y.o. F) Treating RN: Secundino Ginger Primary Care Karson Reede: Grayland Ormond Other Clinician: Referring Allyn Bartelson: Grayland Ormond Treating Ronelle Smallman/Extender: Melburn Hake, HOYT Weeks in Treatment: 71 Active Problems Location of Pain Severity and Description of Pain Patient Has Paino No Site Locations Pain Management and Medication Current Pain Management: Notes pt denies any pain at this time. Electronic Signature(s) Signed: 01/03/2019 11:55:35 AM By: Secundino Ginger Entered By: Secundino Ginger on 01/03/2019 10:55:10 Swayne, [name] ([id number]) -------------------------------------------------------------------------------- Patient/Caregiver Education Details Patient Name: [name] Date of Service: 01/03/2019 10:30 AM Medical Record Number: [id number] Patient Account Number: [id number] Date of Birth/Gender: [birth year] (85 y.o. F) Treating RN: Montey Hora Primary Care Physician: Grayland Ormond Other Clinician: Referring Physician: Grayland Ormond Treating Physician/Extender: Sharalyn Ink in Treatment: 51 Education Assessment Education Provided To: Patient and  Caregiver Education Topics Provided Wound/Skin Impairment: Handouts: Other: wound care as ordered Methods: Demonstration, Explain/Verbal Responses: State content correctly Electronic Signature(s) Signed: 01/03/2019 5:16:27 PM By: Montey Hora Entered By: Montey Hora on 01/03/2019 11:43:26 [place], [name] ([id number]) -------------------------------------------------------------------------------- Wound Assessment Details Patient Name: [name] Date of Service: 01/03/2019 10:30 AM Medical Record Number: [id number]  Patient Account Number: [id number] Date of Birth/Sex: 11/09/1923 (85 y.o. F) Treating RN: Secundino Ginger Primary Care Madlynn Lundeen: Grayland Ormond Other Clinician: Referring Marimar Suber: Grayland Ormond Treating Jamisha Hoeschen/Extender: Melburn Hake, HOYT Weeks in Treatment: 77 Wound Status Wound Number: 2 Primary Pressure Ulcer Etiology: Wound Location: Left Malleolus - Lateral Secondary Arterial Insufficiency Ulcer Wounding Event: Gradually Appeared Etiology: Date Acquired: 04/12/2018 Wound Status: Open Weeks Of Treatment: 37 Comorbid Cataracts, Congestive Heart Failure, Clustered Wound: No History: Hypertension, Osteoarthritis, Neuropathy Photos Photo Uploaded By: Secundino Ginger on 01/03/2019 11:35:29 Wound Measurements Length: (cm) 0.8 % Reduction Width: (cm) 0.7 % Reduction Depth: (cm) 0.3 Epitheliali Area: (cm) 0.44 Tunneling: Volume: (cm) 0.132 Positio Maximum in Area: 43.9% in Volume: -67.1% zation: None Yes n (o'clock): 5 Distance: (cm) 2 Undermining: No Wound Description Classification: Category/Stage IV Foul Odor A Wound Margin: Epibole Slough/Fibr Exudate Amount: Medium Exudate Type: Serous Exudate Color: amber fter Cleansing: No ino Yes Wound Bed Granulation Amount: Large (67-100%) Exposed Structure Granulation Quality: Pink Fascia Exposed: No Necrotic Amount: Small (1-33%) Fat Layer (Subcutaneous Tissue) Exposed: Yes Necrotic Quality:  Adherent Slough Tendon Exposed: No Muscle Exposed: No Joint Exposed: No Hast, [name] ([id number]) Bone Exposed: No Periwound Skin Texture Texture Color No Abnormalities Noted: No No Abnormalities Noted: No Callus: No Atrophie Blanche: No Crepitus: No Cyanosis: No Excoriation: No Ecchymosis: No Induration: No Erythema: No Rash: No Hemosiderin Staining: Yes Scarring: No Mottled: No Pallor: No Moisture Rubor: No No Abnormalities Noted: No Dry / Scaly: No Temperature / Pain Maceration: Yes Temperature: No Abnormality Tenderness on Palpation: Yes Wound Preparation Ulcer Cleansing: Rinsed/Irrigated with Saline Topical Anesthetic Applied: Other: lidocaine 4%, Treatment Notes Wound #2 (Left, Lateral Malleolus) Notes heel - prisma,; toes - mupirocin and silvercel; heel - bordered foam dressing; left malleolus - SNAP VAC, right malleolus - bordered foam dressing for protection Electronic Signature(s) Signed: 01/03/2019 11:55:35 AM By: Secundino Ginger Signed: 01/03/2019 5:16:27 PM By: Montey Hora Entered By: Montey Hora on 01/03/2019 11:26:04 [place], [name] ([id number]) -------------------------------------------------------------------------------- Wound Assessment Details Patient Name: [name] Date of Service: 01/03/2019 10:30 AM Medical Record Number: [id number] Patient Account Number: [id number] Date of Birth/Sex: [birth year] (85 y.o. F) Treating RN: Secundino Ginger Primary Care Catelyn Friel: Grayland Ormond Other Clinician: Referring Roxy Mastandrea: Grayland Ormond Treating Modupe Shampine/Extender: Melburn Hake, HOYT Weeks in Treatment: 73 Wound Status Wound Number: 3 Primary Pressure Ulcer Etiology: Wound Location: Left Calcaneus Wound Open Wounding Event: Gradually Appeared Status: Date Acquired: 10/04/2018 Comorbid Cataracts, Congestive Heart Failure, Weeks Of Treatment: 12 History: Hypertension, Osteoarthritis, Neuropathy Clustered Wound: No Photos Photo  Uploaded By: Secundino Ginger on 01/03/2019 11:35:30 Wound Measurements Length: (cm) 0.9 % Reduction Width: (cm) 1.4 % Reduction Depth: (cm) 0.1 Epitheliali Area: (cm) 0.99 Tunneling: Volume: (cm) 0.099 Underminin in Area: 64% in Volume: 82% zation: None No g: No Wound Description Classification: Category/Stage II Foul Odor A Wound Margin: Indistinct, nonvisible Slough/Fibr Exudate Amount: Small Exudate Type: Serous Exudate Color: amber fter Cleansing: No ino Yes Wound Bed Granulation Amount: None Present (0%) Exposed Structure Necrotic Amount: Medium (34-66%) Fascia Exposed: No Necrotic Quality: Adherent Slough Fat Layer (Subcutaneous Tissue) Exposed: Yes Tendon Exposed: No Muscle Exposed: No Joint Exposed: No Bone Exposed: No Periwound Skin Texture [place], [name] ([id number]) Texture Color No Abnormalities Noted: No No Abnormalities Noted: No Callus: No Atrophie Blanche: No Crepitus: No Cyanosis: No Excoriation: No Ecchymosis: No Induration: No Erythema: No Rash: No Hemosiderin Staining: No Scarring: No Mottled: No Pallor: No Moisture Rubor: No No Abnormalities Noted: No Dry / Scaly: No  Temperature / Pain Maceration: Yes Temperature: No Abnormality Tenderness on Palpation: Yes Wound Preparation Ulcer Cleansing: Rinsed/Irrigated with Saline Topical Anesthetic Applied: Other: lidocaine 4%, Treatment Notes Wound #3 (Left Calcaneus) Notes heel - prisma,; toes - mupirocin and silvercel; heel - bordered foam dressing; left malleolus - SNAP VAC, right malleolus - bordered foam dressing for protection Electronic Signature(s) Signed: 01/03/2019 11:55:35 AM By: Secundino Ginger Entered By: Secundino Ginger on 01/03/2019 11:07:22 [place], [name] ([id number]) -------------------------------------------------------------------------------- Wound Assessment Details Patient Name: [name] Date of Service: 01/03/2019 10:30 AM Medical Record Number:  [id number] Patient Account Number: [id number] Date of Birth/Sex: 31-May-1923 (85 y.o. F) Treating RN: Secundino Ginger Primary Care Jb Dulworth: Grayland Ormond Other Clinician: Referring Jessi Jessop: Grayland Ormond Treating Reeva Davern/Extender: Melburn Hake, HOYT Weeks in Treatment: 30 Wound Status Wound Number: 4 Primary Pressure Ulcer Etiology: Wound Location: Right Toe Second - Distal Wound Open Wounding Event: Pressure Injury Status: Date Acquired: 10/23/2018 Comorbid Cataracts, Congestive Heart Failure, Weeks Of Treatment: 6 History: Hypertension, Osteoarthritis, Neuropathy Clustered Wound: No Photos Photo Uploaded By: Secundino Ginger on 01/03/2019 11:36:28 Wound Measurements Length: (cm) 0.1 Width: (cm) 0.1 Depth: (cm) 0.1 Area: (cm) 0.008 Volume: (cm) 0.001 % Reduction in Area: 88.7% % Reduction in Volume: 85.7% Tunneling: No Undermining: No Wound Description Classification: Category/Stage II Exudate Amount: None Present Wound Bed Granulation Amount: None Present (0%) Exposed Structure Necrotic Amount: Large (67-100%) Fascia Exposed: No Necrotic Quality: Eschar Fat Layer (Subcutaneous Tissue) Exposed: Yes Tendon Exposed: No Muscle Exposed: No Joint Exposed: No Bone Exposed: No Periwound Skin Texture Texture Color No Abnormalities Noted: No No Abnormalities Noted: No Callus: No Atrophie Blanche: No [place], [name] ([id number]) Crepitus: No Cyanosis: No Excoriation: No Ecchymosis: No Induration: No Erythema: No Rash: No Hemosiderin Staining: No Scarring: No Mottled: No Pallor: No Moisture Rubor: No No Abnormalities Noted: No Dry / Scaly: No Maceration: No Wound Preparation Ulcer Cleansing: Rinsed/Irrigated with Saline Topical Anesthetic Applied: Other: lidocaine 4%, Treatment Notes Wound #4 (Right, Distal Toe Second) Notes heel - prisma,; toes - mupirocin and silvercel; heel - bordered foam dressing; left malleolus - SNAP VAC, right malleolus - bordered  foam dressing for protection Electronic Signature(s) Signed: 01/03/2019 11:55:35 AM By: Secundino Ginger Entered By: Secundino Ginger on 01/03/2019 11:08:22 [place], [name] ([id number]) -------------------------------------------------------------------------------- Wound Assessment Details Patient Name: [name] Date of Service: 01/03/2019 10:30 AM Medical Record Number: [id number] Patient Account Number: [id number] Date of Birth/Sex: 14-Sep-1923 (85 y.o. F) Treating RN: Secundino Ginger Primary Care Mubarak Bevens: Grayland Ormond Other Clinician: Referring [name] V[place]: Grayland Ormond Treating Nykole Matos/Extender: STONE III, HOYT Weeks in Treatment: 73 Wound Status Wound Number: 5 Primary Pressure Ulcer Etiology: Wound Location: Right Toe Great - Distal Wound Open Wounding Event: Footwear Injury Status: Date Acquired: 12/03/2018 Comorbid Cataracts, Congestive Heart Failure, Weeks Of Treatment: 4 History: Hypertension, Osteoarthritis, Neuropathy Clustered Wound: No Photos Photo Uploaded By: Secundino Ginger on 01/03/2019 11:36:29 Wound Measurements Length: (cm) 0.3 Width: (cm) 0.4 Depth: (cm) 0.1 Area: (cm) 0.094 Volume: (cm) 0.009 % Reduction in Area: 73.4% % Reduction in Volume: 74.3% Tunneling: No Undermining: No Wound Description Classification: Category/Stage II Foul Odo Exudate Amount: None Present Slough/F r After Cleansing: No ibrino Yes Wound Bed Granulation Amount: None Present (0%) Exposed Structure Necrotic Amount: Small (1-33%) Fascia Exposed: No Necrotic Quality: Eschar Fat Layer (Subcutaneous Tissue) Exposed: Yes Tendon Exposed: No Muscle Exposed: No Joint Exposed: No Bone Exposed: No Periwound Skin Texture Texture Color No Abnormalities Noted: No No Abnormalities Noted: No [place]oun[place], [name] ([id number]) Moisture No Abnormalities Noted:  No Wound Preparation Ulcer Cleansing: Rinsed/Irrigated with Saline Topical Anesthetic Applied: Other: lidocaine  4%, Treatment Notes Wound #5 (Right, Distal Toe Great) Notes heel - prisma,; toes - mupirocin and silvercel; heel - bordered foam dressing; left malleolus - SNAP VAC, right malleolus - bordered foam dressing for protection Electronic Signature(s) Signed: 01/03/2019 11:55:35 AM By: Secundino Ginger Entered By: Secundino Ginger on 01/03/2019 11:09:52 [place], [name] ([id number]) -------------------------------------------------------------------------------- [place] Details Patient Name: [name] Date of Service: 01/03/2019 10:30 AM Medical Record Number: [id number] Patient Account Number: [id number] Date of Birth/Sex: November 10, 1923 (85 y.o. F) Treating RN: Secundino Ginger Primary Care Elzabeth Mcquerry: Grayland Ormond Other Clinician: Referring [place] Nordquist: Grayland Ormond Treating Evelen Vazguez/Extender: Melburn Hake, HOYT Weeks in Treatment: 21 Vital Signs Time Taken: 10:55 Temperature (F): 98.1 Height (in): 65 Pulse (bpm): 99 Weight (lbs): 154.3 Respiratory Rate (breaths/min): 18 Body Mass Index (BMI): 25.7 Blood Pressure (mmHg): 118/59 Reference Range: 80 - 120 mg / dl Airway Electronic Signature(s) Signed: 01/03/2019 11:55:35 AM By: Secundino Ginger Entered BySecundino Ginger on 01/03/2019 10:55:37

## 2019-01-06 ENCOUNTER — Ambulatory Visit: Payer: Medicare Other | Admitting: Podiatry

## 2019-01-10 ENCOUNTER — Encounter: Payer: Medicare Other | Attending: Physician Assistant | Admitting: Physician Assistant

## 2019-01-10 DIAGNOSIS — L97422 Non-pressure chronic ulcer of left heel and midfoot with fat layer exposed: Secondary | ICD-10-CM | POA: Diagnosis not present

## 2019-01-10 DIAGNOSIS — L97512 Non-pressure chronic ulcer of other part of right foot with fat layer exposed: Secondary | ICD-10-CM | POA: Diagnosis not present

## 2019-01-10 DIAGNOSIS — I509 Heart failure, unspecified: Secondary | ICD-10-CM | POA: Insufficient documentation

## 2019-01-10 DIAGNOSIS — L97322 Non-pressure chronic ulcer of left ankle with fat layer exposed: Secondary | ICD-10-CM | POA: Diagnosis present

## 2019-01-10 DIAGNOSIS — I70243 Atherosclerosis of native arteries of left leg with ulceration of ankle: Secondary | ICD-10-CM | POA: Diagnosis not present

## 2019-01-10 DIAGNOSIS — I110 Hypertensive heart disease with heart failure: Secondary | ICD-10-CM | POA: Diagnosis not present

## 2019-01-15 NOTE — Progress Notes (Signed)
DAVONA, KINOSHITA ([id number]) Visit Report for 01/10/2019 Arrival Information Details Patient Name: [name], [name]. Date of Service: 01/10/2019 10:30 AM Medical Record Number: [id number] Patient Account Number: [id number] Date of Birth/Sex: 1923-03-17 (85 y.o. F) Treating RN: Montey Hora Primary Care Vergene Marland: Grayland Ormond Other Clinician: Referring Kimari Lienhard: Grayland Ormond Treating Nalleli Largent/Extender: Melburn Hake, HOYT Weeks in Treatment: 16 Visit Information History Since Last Visit Added or deleted any medications: No Patient Arrived: Walker Any new allergies or adverse reactions: No Arrival Time: 10:42 Had a fall or experienced change in No Accompanied By: self activities of daily living that may affect Transfer Assistance: None risk of falls: Patient Identification Verified: Yes Signs or symptoms of abuse/neglect since last visito No Secondary Verification Process Yes Hospitalized since last visit: No Completed: Implantable device outside of the clinic excluding No Patient Requires Transmission-Based No cellular tissue based products placed in the center Precautions: since last visit: Patient Has Alerts: Yes Has Dressing in Place as Prescribed: Yes Patient Alerts: ABI AVVS 03/29/18 Pain Present Now: No L 1.05 R 1.03, TBI L .56, R .85 ABI AVVS 07/23/18 L .95 R 1.26 Electronic Signature(s) Signed: 01/10/2019 12:00:27 PM By: Lorine Bears RCP, RRT, CHT Entered By: Lorine Bears on 01/10/2019 10:43:05 Riecke, [name] ([id number]) -------------------------------------------------------------------------------- Encounter Discharge Information Details Patient Name: [name] Date of Service: 01/10/2019 10:30 AM Medical Record Number: [id number] Patient Account Number: [id number] Date of Birth/Sex: May 27, 1923 (85 y.o. F) Treating RN: Cornell Barman Primary Care Telicia Hodgkiss: Grayland Ormond Other Clinician: Referring Macaiah Mangal:  Grayland Ormond Treating Resa Rinks/Extender: Melburn Hake, HOYT Weeks in Treatment: 71 Encounter Discharge Information Items Post Procedure Vitals Discharge Condition: Stable Temperature (F): 97.7 Ambulatory Status: Walker Pulse (bpm): 86 Discharge Destination: Home Respiratory Rate (breaths/min): 16 Transportation: Private Auto Blood Pressure (mmHg): 127/57 Accompanied By: self Schedule Follow-up Appointment: Yes Clinical Summary of Care: Electronic Signature(s) Signed: 01/14/2019 11:53:49 AM By: Gretta Cool, BSN, RN, CWS, Kim RN, BSN Entered By: Gretta Cool, BSN, RN, CWS, Kim on 01/10/2019 11:35:36 Holloran, [name] ([id number]) -------------------------------------------------------------------------------- Lower Extremity Assessment Details Patient Name: [name] Date of Service: 01/10/2019 10:30 AM Medical Record Number: [id number] Patient Account Number: [id number] Date of Birth/Sex: 04-Dec-1923 (85 y.o. F) Treating RN: Cornell Barman Primary Care Coleton Woon: Grayland Ormond Other Clinician: Referring Sheketa Ende: Grayland Ormond Treating Mieka Leaton/Extender: Melburn Hake, HOYT Weeks in Treatment: 74 Edema Assessment Assessed: [Left: No] [Right: No] Edema: [Left: Yes] [Right: Yes] Calf Left: Right: Point of Measurement: 31 cm From Medial Instep 31.4 cm 29.4 cm Ankle Left: Right: Point of Measurement: 12 cm From Medial Instep 22 cm 20.3 cm Vascular Assessment Pulses: Dorsalis Pedis Palpable: [Left:Yes] [Right:Yes] Posterior Tibial Extremity colors, hair growth, and conditions: Extremity Color: [Left:Hyperpigmented] [Right:Hyperpigmented] Hair Growth on Extremity: [Left:No] [Right:No] Temperature of Extremity: [Right:Warm] Capillary Refill: [Left:< 3 seconds] [Right:< 3 seconds] Toe Nail Assessment Left: Right: Thick: Yes Yes Discolored: Yes Yes Deformed: Yes Yes Improper Length and Hygiene: No No Electronic Signature(s) Signed: 01/14/2019 11:53:49 AM By: Gretta Cool, BSN, RN, CWS, Kim  RN, BSN Entered By: Gretta Cool, BSN, RN, CWS, Kim on 01/10/2019 11:04:16 Mattila, [name] ([id number]) -------------------------------------------------------------------------------- Multi Wound Chart Details Patient Name: [name] Date of Service: 01/10/2019 10:30 AM Medical Record Number: [id number] Patient Account Number: [id number] Date of Birth/Sex: 10/21/1923 (85 y.o. F) Treating RN: Cornell Barman Primary Care Tajah Schreiner: Grayland Ormond Other Clinician: Referring Alpha Chouinard: Grayland Ormond Treating Nollan Muldrow/Extender: STONE III, HOYT Weeks in Treatment: 74 Vital Signs Height(in): 65 Pulse(bpm): 86 Weight(lbs): 154.3 Blood Pressure(mmHg): 127/57 Body Mass Index(BMI): 26  Temperature(F): 97.7 Respiratory Rate 18 (breaths/min): Photos: [2:No Photos] [3:No Photos] [4:No Photos] Wound Location: [2:Left Malleolus - Lateral] [3:Left Calcaneus] [4:Right Toe Second - Distal] Wounding Event: [2:Gradually Appeared] [3:Gradually Appeared] [4:Pressure Injury] 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] [3:Pressure Ulcer] [4:Pressure Ulcer] 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] [3:N/A] [4:N/A] Comorbid History: [2:Cataracts, Congestive Heart Failure, Hypertension, Osteoarthritis, Neuropathy] [3:Cataracts, Congestive Heart Failure, Hypertension, Osteoarthritis, Neuropathy] [4:Cataracts, Congestive Heart Failure, Hypertension, Osteoarthritis,  Neuropathy] Date Acquired: [2:04/12/2018] [3:10/04/2018] [4:10/23/2018] Weeks of Treatment: [2:38] [3:13] [4:7] Wound Status: [2:Open] [3:Open] [4:Open] Measurements L x W x D [2:0.7x0.6x0.2] [3:0.9x1.3x0.1] [4:0.1x0.1x0.1] (cm) Area (cm) : [2:0.33] [3:0.919] [4:0.008] Volume (cm) : [2:0.066] [3:0.092] [4:0.001] % Reduction in Area: [2:58.00%] [3:66.60%] [4:88.70%] % Reduction in Volume: [2:16.50%] [3:83.30%] [4:85.70%] Starting Position 1 [2:7] (o'clock): Ending Position 1 [2:10] (o'clock): Maximum Distance 1 (cm):  [2:0.2] Undermining: [2:Yes] [3:No] [4:No] Classification: [2:Category/Stage IV] [3:Category/Stage II] [4:Category/Stage II] Exudate Amount: [2:Medium] [3:Medium] [4:None Present] Exudate Type: [2:Serous] [3:Serous] [4:N/A] Exudate Color: [2:amber] [3:amber] [4:N/A] Wound Margin: [2:Epibole] [3:Indistinct, nonvisible] [4:Indistinct, nonvisible] Granulation Amount: [2:Large (67-100%)] [3:Medium (34-66%)] [4:None Present (0%)] Granulation Quality: [2:Pink] [3:Pink] [4:N/A] Necrotic Amount: [2:Small (1-33%)] [3:Medium (34-66%)] [4:Large (67-100%)] Necrotic Tissue: [2:Adherent Slough] [3:Adherent Slough] [4:Eschar] Exposed Structures: [2:Fat Layer (Subcutaneous Tissue) Exposed: Yes Fascia: No Tendon: No Muscle: No] [3:Fat Layer (Subcutaneous Tissue) Exposed: Yes Fascia: No Tendon: No Muscle: No] [4:Fascia: No Fat Layer (Subcutaneous Tissue) Exposed: No Tendon: No Muscle: No] Joint: No Joint: No Joint: No Bone: No Bone: No Bone: No Limited to Skin Breakdown Epithelialization: Small (1-33%) Small (1-33%) Large (67-100%) Periwound Skin Texture: Excoriation: No Excoriation: No Excoriation: No Induration: No Induration: No Induration: No Callus: No Callus: No Callus: No Crepitus: No Crepitus: No Crepitus: No Rash: No Rash: No Rash: No Scarring: No Scarring: No Scarring: No Periwound Skin Moisture: Maceration: Yes Dry/Scaly: Yes Maceration: No Dry/Scaly: No Maceration: No Dry/Scaly: No Periwound Skin Color: Erythema: Yes Atrophie Blanche: No Ecchymosis: Yes Atrophie Blanche: No Cyanosis: No Atrophie Blanche: No Cyanosis: No Ecchymosis: No Cyanosis: No Ecchymosis: No Erythema: No Erythema: No Hemosiderin Staining: No Hemosiderin Staining: No Hemosiderin Staining: No Mottled: No Mottled: No Mottled: No Pallor: No Pallor: No Pallor: No Rubor: No Rubor: No Rubor: No Erythema Location: Circumferential N/A N/A Temperature: No Abnormality No Abnormality  N/A Tenderness on Palpation: Yes Yes No Wound Preparation: Ulcer Cleansing: Ulcer Cleansing: Ulcer Cleansing: Rinsed/Irrigated with Saline Rinsed/Irrigated with Saline Rinsed/Irrigated with Saline Topical Anesthetic Applied: Topical Anesthetic Applied: Topical Anesthetic Applied: Other: lidocaine 4% Other: lidocaine 4% Other: lidocaine 4% Wound Number: 5 N/A N/A Photos: No Photos N/A N/A Wound Location: Right Toe Great - Distal N/A N/A Wounding Event: Footwear Injury N/A N/A Primary Etiology: Pressure Ulcer N/A N/A Secondary Etiology: N/A N/A N/A Comorbid History: Cataracts, Congestive Heart N/A N/A Failure, Hypertension, Osteoarthritis, Neuropathy Date Acquired: 12/03/2018 N/A N/A Weeks of Treatment: 5 N/A N/A Wound Status: Open N/A N/A Measurements L x W x D 0.3x0.4x0.1 N/A N/A (cm) Area (cm) : 0.094 N/A N/A Volume (cm) : 0.009 N/A N/A % Reduction in Area: 73.40% N/A N/A % Reduction in Volume: 74.30% N/A N/A Undermining: No N/A N/A Classification: Category/Stage II N/A N/A Exudate Amount: Small N/A N/A Exudate Type: Serous N/A N/A Exudate Color: amber N/A N/A Wound Margin: Flat and Intact N/A N/A Granulation Amount: Small (1-33%) N/A N/A Granulation Quality: Pink N/A N/A Necrotic Amount: Large (67-100%) N/A N/A Necrotic Tissue: Eschar, Adherent Slough N/A N/A Graley, [name] ([id number]) Exposed Structures: Fat Layer (Subcutaneous N/A N/A Tissue) Exposed: Yes Fascia: No Tendon:  No Muscle: No Joint: No Bone: No Epithelialization: None N/A N/A Periwound Skin Texture: Excoriation: No N/A N/A Induration: No Callus: No Crepitus: No Rash: No Scarring: No Periwound Skin Moisture: Maceration: No N/A N/A Dry/Scaly: No Periwound Skin Color: Ecchymosis: Yes N/A N/A Atrophie Blanche: No Cyanosis: No Erythema: No Hemosiderin Staining: No Mottled: No Pallor: No Rubor: No Erythema Location: N/A N/A N/A Temperature: N/A N/A N/A Tenderness on Palpation: No  N/A N/A Wound Preparation: Ulcer Cleansing: N/A N/A Rinsed/Irrigated with Saline Topical Anesthetic Applied: Other: lidocaine 4% Treatment Notes Electronic Signature(s) Signed: 01/14/2019 11:53:49 AM By: Gretta Cool, BSN, RN, CWS, Kim RN, BSN Entered By: Gretta Cool, BSN, RN, CWS, Kim on 01/10/2019 11:04:38 Joni Fears [name] ([id number]) -------------------------------------------------------------------------------- Cruger Details Patient Name: [name], [name] Date of Service: 01/10/2019 10:30 AM Medical Record Number: [id number] Patient Account Number: [id number] Date of Birth/Sex: [birth year] (85 y.o. F) Treating RN: Cornell Barman Primary Care Marylee Belzer: Grayland Ormond Other Clinician: Referring Kelbie Moro: Grayland Ormond Treating Abdikadir Fohl/Extender: Melburn Hake, HOYT Weeks in Treatment: 61 Active Inactive Abuse / Safety / Falls / Self Care Management Nursing Diagnoses: Potential for falls Goals: Patient will not experience any injury related to falls Date Initiated: 08/10/2017 Target Resolution Date: 11/10/2017 Goal Status: Active Interventions: Assess Activities of Daily Living upon admission and as needed Assess fall risk on admission and as needed Assess: immobility, friction, shearing, incontinence upon admission and as needed Notes: Nutrition Nursing Diagnoses: Imbalanced nutrition Potential for alteratiion in Nutrition/Potential for imbalanced nutrition Goals: Patient/caregiver agrees to and verbalizes understanding of need to use nutritional supplements and/or vitamins as prescribed Date Initiated: 08/10/2017 Target Resolution Date: 12/08/2017 Goal Status: Active Interventions: Assess patient nutrition upon admission and as needed per policy Notes: Orientation to the Wound Care Program Nursing Diagnoses: Knowledge deficit related to the wound healing center program Goals: Patient/caregiver will verbalize understanding of the [facility] Date  Initiated: 08/10/2017 Target Resolution Date: 09/08/2017 Goal Status: Active KEMYA, SHED ([id number]) Interventions: Provide education on orientation to the wound center Notes: Pain, Acute or Chronic Nursing Diagnoses: Pain, acute or chronic: actual or potential Potential alteration in comfort, pain Goals: Patient/caregiver will verbalize adequate pain control between visits Date Initiated: 08/10/2017 Target Resolution Date: 12/08/2017 Goal Status: Active Interventions: Complete pain assessment as per visit requirements Notes: Wound/Skin Impairment Nursing Diagnoses: Impaired tissue integrity Knowledge deficit related to ulceration/compromised skin integrity Goals: Ulcer/skin breakdown will have a volume reduction of 80% by week 12 Date Initiated: 08/10/2017 Target Resolution Date: 12/01/2017 Goal Status: Active Interventions: Assess patient/caregiver ability to perform ulcer/skin care regimen upon admission and as needed Notes: Electronic Signature(s) Signed: 01/14/2019 11:53:49 AM By: Gretta Cool, BSN, RN, CWS, Kim RN, BSN Entered By: Gretta Cool, BSN, RN, CWS, Kim on 01/10/2019 11:04:29 Mayer, [name] ([id number]) -------------------------------------------------------------------------------- Pain Assessment Details Patient Name: [name] Date of Service: 01/10/2019 10:30 AM Medical Record Number: [id number] Patient Account Number: [id number] Date of Birth/Sex: 07-Aug-1923 (85 y.o. F) Treating RN: Montey Hora Primary Care Angelica Frandsen: Grayland Ormond Other Clinician: Referring Iyani Dresner: Grayland Ormond Treating Zaiden Ludlum/Extender: Melburn Hake, HOYT Weeks in Treatment: 88 Active Problems Location of Pain Severity and Description of Pain Patient Has Paino No Site Locations Pain Management and Medication Current Pain Management: Electronic Signature(s) Signed: 01/10/2019 12:00:27 PM By: Paulla Fore, RRT, CHT Signed: 01/10/2019 5:34:10 PM By: Montey Hora Entered By: Lorine Bears on 01/10/2019 10:43:15 Torrez, [name] ([id number]) -------------------------------------------------------------------------------- Patient/Caregiver Education Details Patient Name: [name] Date of Service: 01/10/2019 10:30 AM Medical Record  Number: [id number] Patient Account Number: [id number] Date of Birth/Gender: January 13, 1923 (85 y.o. F) Treating RN: Cornell Barman Primary Care Physician: Grayland Ormond Other Clinician: Referring Physician: Grayland Ormond Treating Physician/Extender: Sharalyn Ink in Treatment: 3 Education Assessment Education Provided To: Patient Education Topics Provided Wound/Skin Impairment: Handouts: Caring for Your Ulcer Methods: Demonstration, Explain/Verbal Responses: State content correctly Electronic Signature(s) Signed: 01/14/2019 11:53:49 AM By: Gretta Cool, BSN, RN, CWS, Kim RN, BSN Entered By: Gretta Cool, BSN, RN, CWS, Kim on 01/10/2019 11:35:46 Acres, [name] ([id number]) -------------------------------------------------------------------------------- Wound Assessment Details Patient Name: [name] Date of Service: 01/10/2019 10:30 AM Medical Record Number: [id number] Patient Account Number: [id number] Date of Birth/Sex: 07-01-1923 (85 y.o. F) Treating RN: Cornell Barman Primary Care Averlee Swartz: Grayland Ormond Other Clinician: Referring Casin Federici: Grayland Ormond Treating Tykeisha Peer/Extender: Melburn Hake, HOYT Weeks in Treatment: 74 Wound Status Wound Number: 2 Primary Pressure Ulcer Etiology: Wound Location: Left Malleolus - Lateral Secondary Arterial Insufficiency Ulcer Wounding Event: Gradually Appeared Etiology: Date Acquired: 04/12/2018 Wound Status: Open Weeks Of Treatment: 38 Comorbid Cataracts, Congestive Heart Failure, Clustered Wound: No History: Hypertension, Osteoarthritis, Neuropathy Photos Photo Uploaded By: Gretta Cool, BSN, RN, CWS, Kim on 01/14/2019 11:57:24 Wound  Measurements Length: (cm) 0.7 % Reducti Width: (cm) 0.6 % Reducti Depth: (cm) 0.2 Epithelia Area: (cm) 0.33 Tunnelin Volume: (cm) 0.066 Posit Maximu on in Area: 58% on in Volume: 16.5% lization: Small (1-33%) g: Yes ion (o'clock): 5 m Distance: (cm) 2.2 Undermining: No Wound Description Classification: Category/Stage IV Foul Odor Wound Margin: Epibole Slough/Fi Exudate Amount: Medium Exudate Type: Serous Exudate Color: amber After Cleansing: No brino Yes Wound Bed Granulation Amount: Large (67-100%) Exposed Structure Granulation Quality: Pink Fascia Exposed: No Necrotic Amount: Small (1-33%) Fat Layer (Subcutaneous Tissue) Exposed: Yes Necrotic Quality: Adherent Slough Tendon Exposed: No Muscle Exposed: No Joint Exposed: No Menge, [name] ([id number]) Bone Exposed: No Periwound Skin Texture Texture Color No Abnormalities Noted: No No Abnormalities Noted: No Callus: No Atrophie Blanche: No Crepitus: No Cyanosis: No Excoriation: No Ecchymosis: No Induration: No Erythema: Yes Rash: No Erythema Location: Circumferential Scarring: No Hemosiderin Staining: No Mottled: No Moisture Pallor: No No Abnormalities Noted: No Rubor: No Dry / Scaly: No Maceration: Yes Temperature / Pain Temperature: No Abnormality Tenderness on Palpation: Yes Wound Preparation Ulcer Cleansing: Rinsed/Irrigated with Saline Topical Anesthetic Applied: Other: lidocaine 4%, Treatment Notes Wound #2 (Left, Lateral Malleolus) Notes L Heel Prisma, left malleolus packing strip, bandaid on toes and mupiricin Electronic Signature(s) Signed: 01/14/2019 11:53:49 AM By: Gretta Cool, BSN, RN, CWS, Kim RN, BSN Entered By: Gretta Cool, BSN, RN, CWS, Kim on 01/10/2019 11:18:12 Canner, [name] ([id number]) -------------------------------------------------------------------------------- Wound Assessment Details Patient Name: [name] Date of Service: 01/10/2019 10:30 AM Medical Record  Number: [id number] Patient Account Number: [id number] Date of Birth/Sex: [birth year] (85 y.o. F) Treating RN: Cornell Barman Primary Care Lenox Ladouceur: Grayland Ormond Other Clinician: Referring Charvi Gammage: Grayland Ormond Treating Mouhamadou Gittleman/Extender: Melburn Hake, HOYT Weeks in Treatment: 74 Wound Status Wound Number: 3 Primary Pressure Ulcer Etiology: Wound Location: Left Calcaneus Wound Open Wounding Event: Gradually Appeared Status: Date Acquired: 10/04/2018 Comorbid Cataracts, Congestive Heart Failure, Weeks Of Treatment: 13 History: Hypertension, Osteoarthritis, Neuropathy Clustered Wound: No Photos Photo Uploaded By: Gretta Cool, BSN, RN, CWS, Kim on 01/14/2019 11:57:24 Wound Measurements Length: (cm) 0.9 Width: (cm) 1.3 Depth: (cm) 0.1 Area: (cm) 0.919 Volume: (cm) 0.092 % Reduction in Area: 66.6% % Reduction in Volume: 83.3% Epithelialization: Small (1-33%) Tunneling: No Undermining: No Wound Description Classification: Category/Stage II Foul Odor Wound Margin: Indistinct, nonvisible Slough/Fi Exudate Amount: Medium  Exudate Type: Serous Exudate Color: amber After Cleansing: No brino Yes Wound Bed Granulation Amount: Medium (34-66%) Exposed Structure Granulation Quality: Pink Fascia Exposed: No Necrotic Amount: Medium (34-66%) Fat Layer (Subcutaneous Tissue) Exposed: Yes Necrotic Quality: Adherent Slough Tendon Exposed: No Muscle Exposed: No Joint Exposed: No Bone Exposed: No Periwound Skin Texture Donaway, [name] ([id number]) Texture Color No Abnormalities Noted: No No Abnormalities Noted: No Callus: No Atrophie Blanche: No Crepitus: No Cyanosis: No Excoriation: No Ecchymosis: No Induration: No Erythema: No Rash: No Hemosiderin Staining: No Scarring: No Mottled: No Pallor: No Moisture Rubor: No No Abnormalities Noted: No Dry / Scaly: Yes Temperature / Pain Maceration: No Temperature: No Abnormality Tenderness on Palpation: Yes Wound  Preparation Ulcer Cleansing: Rinsed/Irrigated with Saline Topical Anesthetic Applied: Other: lidocaine 4%, Treatment Notes Wound #3 (Left Calcaneus) Notes L Heel Prisma, left malleolus packing strip, bandaid on toes and mupiricin Electronic Signature(s) Signed: 01/14/2019 11:53:49 AM By: Gretta Cool, BSN, RN, CWS, Kim RN, BSN Entered By: Gretta Cool, BSN, RN, CWS, Kim on 01/10/2019 10:58:35 Stefanko, [name] ([id number]) -------------------------------------------------------------------------------- Wound Assessment Details Patient Name: [name] Date of Service: 01/10/2019 10:30 AM Medical Record Number: [id number] Patient Account Number: [id number] Date of Birth/Sex: 1923-04-15 (85 y.o. F) Treating RN: Cornell Barman Primary Care Saloni Lablanc: Grayland Ormond Other Clinician: Referring Dierra Riesgo: Grayland Ormond Treating Damarys Speir/Extender: Melburn Hake, HOYT Weeks in Treatment: 74 Wound Status Wound Number: 4 Primary Pressure Ulcer Etiology: Wound Location: Right Toe Second - Distal Wound Open Wounding Event: Pressure Injury Status: Date Acquired: 10/23/2018 Comorbid Cataracts, Congestive Heart Failure, Weeks Of Treatment: 7 History: Hypertension, Osteoarthritis, Neuropathy Clustered Wound: No Photos Photo Uploaded By: Gretta Cool, BSN, RN, CWS, Kim on 01/14/2019 11:58:33 Wound Measurements Length: (cm) 0.1 Width: (cm) 0.1 Depth: (cm) 0.1 Area: (cm) 0.008 Volume: (cm) 0.001 % Reduction in Area: 88.7% % Reduction in Volume: 85.7% Epithelialization: Large (67-100%) Tunneling: No Undermining: No Wound Description Classification: Category/Stage II Foul Odor Wound Margin: Indistinct, nonvisible Slough/Fi Exudate Amount: None Present After Cleansing: No brino No Wound Bed Granulation Amount: None Present (0%) Exposed Structure Necrotic Amount: Large (67-100%) Fascia Exposed: No Necrotic Quality: Eschar Fat Layer (Subcutaneous Tissue) Exposed: No Tendon Exposed: No Muscle  Exposed: No Joint Exposed: No Bone Exposed: No Limited to Skin Breakdown Periwound Skin Texture Texture Color Erlandson, [name] ([id number]) No Abnormalities Noted: No No Abnormalities Noted: No Callus: No Atrophie Blanche: No Crepitus: No Cyanosis: No Excoriation: No Ecchymosis: Yes Induration: No Erythema: No Rash: No Hemosiderin Staining: No Scarring: No Mottled: No Pallor: No Moisture Rubor: No No Abnormalities Noted: No Dry / Scaly: No Maceration: No Wound Preparation Ulcer Cleansing: Rinsed/Irrigated with Saline Topical Anesthetic Applied: Other: lidocaine 4%, Treatment Notes Wound #4 (Right, Distal Toe Second) Notes L Heel Prisma, left malleolus packing strip, bandaid on toes and mupiricin Electronic Signature(s) Signed: 01/14/2019 11:53:49 AM By: Gretta Cool, BSN, RN, CWS, Kim RN, BSN Entered By: Gretta Cool, BSN, RN, CWS, Kim on 01/10/2019 10:59:24 [name] ([id number]) -------------------------------------------------------------------------------- Wound Assessment Details Patient Name: [name] Date of Service: 01/10/2019 10:30 AM Medical Record Number: [id number] Patient Account Number: [id number] Date of Birth/Sex: 01/10/1923 (85 y.o. F) Treating RN: Cornell Barman Primary Care Deo Mehringer: Grayland Ormond Other Clinician: Referring Loella Hickle: Grayland Ormond Treating Kelten Enochs/Extender: STONE III, HOYT Weeks in Treatment: 74 Wound Status Wound Number: 5 Primary Pressure Ulcer Etiology: Wound Location: Right Toe Great - Distal Wound Open Wounding Event: Footwear Injury Status: Date Acquired: 12/03/2018 Comorbid Cataracts, Congestive Heart Failure, Weeks Of Treatment: 5 History: Hypertension, Osteoarthritis, Neuropathy Clustered Wound:  No Photos Photo Uploaded By: Gretta Cool, BSN, RN, CWS, Kim on 01/14/2019 11:58:34 Wound Measurements Length: (cm) 0.3 Width: (cm) 0.4 Depth: (cm) 0.1 Area: (cm) 0.094 Volume: (cm) 0.009 % Reduction in Area:  73.4% % Reduction in Volume: 74.3% Epithelialization: None Tunneling: No Undermining: No Wound Description Classification: Category/Stage II Foul Odor Wound Margin: Flat and Intact Slough/Fi Exudate Amount: Small Exudate Type: Serous Exudate Color: amber After Cleansing: No brino Yes Wound Bed Granulation Amount: Small (1-33%) Exposed Structure Granulation Quality: Pink Fascia Exposed: No Necrotic Amount: Large (67-100%) Fat Layer (Subcutaneous Tissue) Exposed: Yes Necrotic Quality: Eschar, Adherent Slough Tendon Exposed: No Muscle Exposed: No Joint Exposed: No Bone Exposed: No Periwound Skin Texture Hickling, [name] ([id number]) Texture Color No Abnormalities Noted: No No Abnormalities Noted: No Callus: No Atrophie Blanche: No Crepitus: No Cyanosis: No Excoriation: No Ecchymosis: Yes Induration: No Erythema: No Rash: No Hemosiderin Staining: No Scarring: No Mottled: No Pallor: No Moisture Rubor: No No Abnormalities Noted: No Dry / Scaly: No Maceration: No Wound Preparation Ulcer Cleansing: Rinsed/Irrigated with Saline Topical Anesthetic Applied: Other: lidocaine 4%, Treatment Notes Wound #5 (Right, Distal Toe Great) Notes L Heel Prisma, left malleolus packing strip, bandaid on toes and mupiricin Electronic Signature(s) Signed: 01/14/2019 11:53:49 AM By: Gretta Cool, BSN, RN, CWS, Kim RN, BSN Entered By: Gretta Cool, BSN, RN, CWS, Kim on 01/10/2019 11:00:25 Parodi, [name] ([id number]) -------------------------------------------------------------------------------- Vitals Details Patient Name: [name] Date of Service: 01/10/2019 10:30 AM Medical Record Number: [id number] Patient Account Number: [id number] Date of Birth/Sex: 1923-04-09 (85 y.o. F) Treating RN: Montey Hora Primary Care Ana Woodroof: Grayland Ormond Other Clinician: Referring Adylene Dlugosz: Grayland Ormond Treating Anagabriela Jokerst/Extender: Melburn Hake, HOYT Weeks in Treatment: 32 Vital Signs Time  Taken: 10:43 Temperature (F): 97.7 Height (in): 65 Pulse (bpm): 86 Weight (lbs): 154.3 Respiratory Rate (breaths/min): 18 Body Mass Index (BMI): 25.7 Blood Pressure (mmHg): 127/57 Reference Range: 80 - 120 mg / dl Airway Electronic Signature(s) Signed: 01/10/2019 12:00:27 PM By: Lorine Bears RCP, RRT, CHT Entered By: Lorine Bears on 01/10/2019 10:45:30

## 2019-01-15 NOTE — Progress Notes (Signed)
[name], [name] ([id number]) Visit Report for 01/10/2019 Chief Complaint Document Details Patient Name: [name], [name]. Date of Service: 01/10/2019 10:30 AM Medical Record Number: [id number] Patient Account Number: [id number] Date of Birth/Sex: 12-Nov-1923 (85 y.o. F) Treating RN: Montey Hora Primary Care Provider: Grayland Ormond Other Clinician: Referring Provider: Grayland Ormond Treating Provider/Extender: Melburn Hake, Jahleah Mariscal Weeks in Treatment: 42 Information Obtained from: Patient Chief Complaint Patient is here for right lateral malleolus, right 2nd distal toe and left lateral malleolus ulcer Electronic Signature(s) Signed: 01/12/2019 3:37:27 PM By: Worthy Keeler PA-C Entered By: Worthy Keeler on 01/10/2019 10:48:00 [name], [name] ([id number]) -------------------------------------------------------------------------------- Debridement Details Patient Name: [name] Date of Service: 01/10/2019 10:30 AM Medical Record Number: [id number] Patient Account Number: [id number] Date of Birth/Sex: Feb 24, 1923 (85 y.o. F) Treating RN: Cornell Barman Primary Care Provider: Grayland Ormond Other Clinician: Referring Provider: Grayland Ormond Treating Provider/Extender: Melburn Hake, Sonny Poth Weeks in Treatment: 74 Debridement Performed for Wound #5 Right,Distal Toe Great Assessment: Performed By: Physician STONE III, [place] E., PA-C Debridement Type: Debridement Level of Consciousness (Pre- Awake and Alert procedure): Pre-procedure Verification/Time Yes - 11:22 Out Taken: Start Time: 11:22 Pain Control: Lidocaine Total Area Debrided (L x W): 0.3 (cm) x 0.4 (cm) = 0.12 (cm) Tissue and other material Viable, Non-Viable, Callus, Slough, Subcutaneous, Slough debrided: Level: Skin/Subcutaneous Tissue Debridement Description: Excisional Instrument: Curette Bleeding: Minimum Hemostasis Achieved: Pressure End Time: 11:25 Response to Treatment: Procedure was tolerated  well Level of Consciousness Awake and Alert (Post-procedure): Post Debridement Measurements of Total Wound Length: (cm) 0.3 Stage: Category/Stage II Width: (cm) 0.4 Depth: (cm) 0.3 Volume: (cm) 0.028 Character of Wound/Ulcer Post Stable Debridement: Post Procedure Diagnosis Same as Pre-procedure Electronic Signature(s) Signed: 01/12/2019 3:37:27 PM By: Worthy Keeler PA-C Signed: 01/14/2019 11:53:49 AM By: Gretta Cool, BSN, RN, CWS, Kim RN, BSN Entered By: Gretta Cool, BSN, RN, CWS, Kim on 01/10/2019 11:25:20 [name], [name] ([id number]) -------------------------------------------------------------------------------- HPI Details Patient Name: [name] Date of Service: 01/10/2019 10:30 AM Medical Record Number: [id number] Patient Account Number: [id number] Date of Birth/Sex: [birth year] (85 y.o. 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 Dr. Amalia Hailey the podiatrist saw her on 07/20/2017 and sent her to the wound center for possible hyperbaric oxygen therapy. past medical history of peripheral vascular disease, varicose veins, status post appendectomy, basal cell carcinoma excision from the left leg, cholecystectomy, pacemaker placement, right lower extremity angiography done by Dr. dew in March 2017 with placement of a stent. there is also note of a successful ablation of the right small saphenous vein done which was reviewed by ultrasound on 10/24/2016. the patient had a right small saphenous vein ablation done on 10/20/2016. The patient has never been a smoker. She has been seen by Dr. Corene Cornea dew the  vascular surgeon who most recently saw her on 06/15/2017 for evaluation of ongoing problems with right leg swelling. She had a lower extremity arterial duplex examination done(02/13/17) which showed patent distal right superficial femoral artery stent and above-the-knee popliteal stent without evidence of restenosis. The ABI was more than 1.3 on the right and more than 1.3 on the left. This was consistent with noncompressible arteries due to medial calcification. The right great toe  pressure and PPG waveforms are within normal limits and the left great toe pressure and PPG waveforms are decreased. he recommended she continue to wear her compression stockings and continue with elevation. She is scheduled to have a noninvasive arterial study in the near future 08/16/2017 -- had a lower extremity arterial duplex examination done which showed patent distal right superficial femoral artery stent and above-the-knee popliteal stent without evidence of restenosis. The ABI was more than 1.3 on the right and more than 1.3 on the left. This was consistent with noncompressible arteries due to medial calcification. The right great toe pressure and PPG waveforms are within normal limits and the left great toe pressure and PPG waveforms are decreased. the x-ray of the right ankle has not yet been done 08/24/2017 -- had a right ankle x-ray -- IMPRESSION:1. No fracture, bone lesion or evidence of osteomyelitis. 2. Lateral soft tissue swelling with a soft tissue ulcer. she has not yet seen the vascular surgeon for review 08/31/17 on evaluation today patient's wound appears to be showing signs of improvement. She still with her appointment with vascular in order to review her results of her vascular study and then determine if any intervention would be recommended at that time. No fevers, chills, nausea, or vomiting noted at this time. She has been tolerating the dressing changes without complication. 09/28/17 on  evaluation today patient's wound appears to show signs of good improvement in regard to the granulation tissue which is surfacing. There is still a layer of slough covering the wound and the posterior portion is still significantly deeper than the anterior nonetheless there has been some good sign of things moving towards the better. She is going to go back to Dr. dew for reevaluation to ensure her blood flow is still appropriate. That will be before her next evaluation with [place] next week. No fevers, chills, nausea, or vomiting noted at this time. Patient does have some discomfort rated to be a 3-4/10 depending on activity specifically cleansing the wound makes it worse. 10/05/2017 -- the patient was seen by Dr. Lucky Cowboy last week and noninvasive studies showed a normal right ABI with brisk triphasic waveforms consistent with no arterial insufficiency including normal digital pressures. The duplex showed a patent distal right SFA stent and the proximal SFA was also normal. He was pleased with her test and thought she should have enough of perfusion for normal wound healing. He would see her back in 6 months time. 12/21/17 on evaluation today patient appears to be doing fairly well in regard to her right lateral ankle wound. Unfortunately the main issue that she is expansion at this point is that she is having some issues with what appears to be some cellulitis in the MARRA, FRAGA. ([id number]) right anterior shin. She has also been noting a little bit of uncomfortable feeling especially last night and her ankle area. I'm afraid that she made the developing a little bit of an infection. With that being said I think it is in the early stages. 12/28/17 on evaluation today patient's ankle appears to be doing excellent. She's making good progress at this point the cellulitis seems to have improved after last week's evaluation. Overall she is having no significant discomfort which is excellent news. She does  have an appointment with Dr. dew on March 29, 2018 for reevaluation in regard to the stent he placed. She seems to have excellent blood flow in the right lower extremity. 01/19/12 on evaluation today patient's wound appears to be  doing very well. In fact she does not appear to require debridement at this point, there's no evidence of infection, and overall from the standpoint of the wound she seems to be doing very well. With that being said I believe that it may be time to switch to different dressing away from the [facility] Dressing she tells me she does have a lot going on her friend actually passed away yesterday and she's also having a lot of issues with her husband this obviously is weighing heavy on her as far as your thoughts and concerns today. 01/25/18 on evaluation today patient appears to be doing fairly well in regard to her right lateral malleolus. She has been tolerating the dressing changes without complication. Overall I feel like this is definitely showing signs of improvement as far as how the overall appearance of the wound is there's also evidence of epithelium start to migrate over the granulation tissue. In general I think that she is progressing nicely as far as the wound is concerned. The only concern she really has is whether or not we can switch to every other week visits in order to avoid having as many appointments as her daughters have a difficult time getting her to her appointments as well as the patient's husband to his he is not doing very well at this point. 02/22/18 on evaluation today patient's right lateral malleolus ulcer appears to be doing great. She has been tolerating the dressing changes without complication. Overall you making excellent progress at this time. Patient is having no significant discomfort. 03/15/18 on evaluation today patient appears to be doing much more poorly in regard to her right lateral ankle ulcer at this point. Unfortunately since  have last seen her her husband has passed just a few days ago is obviously weighed heavily on her her daughter also had surgery well she is with her today as usual. There does not appear to be any evidence of infection she does seem to have significant contusion/deep tissue injury to the right lateral malleolus which was not noted previous when I saw her last. It's hard to tell of exactly when this injury occurred although during the time she was spending the night in the hospital this may have been most likely. 03/22/18 on evaluation today patient appears to actually be doing very well in regard to her ulcer. She did unfortunately have a setback which was noted last week however the good news is we seem to be getting back on track and in fact the wound in the core did still have some necrotic tissue which will be addressed at this point today but in general I'm seeing signs that things are on the up and up. She is glad to hear this obviously she's been somewhat concerned that due to the how her wound digressed more recently. 03/29/18 on evaluation today patient appears to be doing fairly well in regard to her right lower extremity lateral malleolus ulcer. She unfortunately does have a new area of pressure injury over the inferior portion where the wound has opened up a little bit larger secondary to the pressure she seems to be getting. She does tell me sometimes when she sleeps at night that it actually hurts and does seem to be pushing on the area little bit more unfortunately. There does not appear to be any evidence of infection which is good news. She has been tolerating the dressing changes without complication. She also did have some bruising in the left second  and third toes due to the fact that she may have bump this or injured it although she has neuropathy so she does not feel she did move recently that may have been where this came from. Nonetheless there does not appear to be any  evidence of infection at this time. 04/12/18 on evaluation today patient's wound on the right lateral ankle actually appears to be doing a little bit better with a lot of necrotic docking tissue centrally loosening up in clearing away. However she does have the beginnings of a deep tissue injury on the left lateral malleolus likely due to the fact we've been trying offload the right as much as we have. I think she may benefit from an assistive soft device to help with offloading and it looks like they're looking at one of the doughnut conditions that wraps around the lower leg to offload which I think will definitely do a good job. With that being said I think we definitely need to address this issue on the left before it becomes a wound. Patient is not having significant pain. 04/19/18 on evaluation today patient appears to be doing excellent in regard to the progress she's made with her right lateral ankle ulcer. The left ankle region which did show evidence of a deep tissue injury seems to be resolving there's little fluid noted underneath and a blister there's nothing open at this point in time overall I feel like this is progressing nicely which is good news. She does not seem to be having significant discomfort at this point which is also good news. 04/25/18-She is here in follow up evaluation for bilateral lateral malleolar ulcers. The right lateral malleolus ulcer with pale subcutaneous tissue exposure, central area of ulcer with tendon/periosteum exposed. The left lateral malleolus ulcer now with [place], [name] ([id number]) central area of nonviable tissue, otherwise deep tissue injury. She is wearing compression wraps to the left lower extremity, she will place the right lower extremity compression wraps on when she gets home. She will be out of town over the weekend and return next week and follow-up appointment. She completed her doxycycline this morning 05/03/18 on evaluation today  patient appears to be doing very well in regard to her right lateral ankle ulcer in general. At least she's showing some signs of improvement in this regard. Unfortunately she has some additional injury to the left lateral malleolus region which appears to be new likely even over the past several days. Again this determination is based on the overall appearance. With that being said the patient is obviously frustrated about this currently. 05/10/18-She is here in follow-up evaluation for bilateral lateral malleolar ulcers. She states she has purchased offloading shoes/boots and they will arrive tomorrow. She was asked to bring them in the office at next week's appointment so her provider is aware of product being utilized. She continues to sleep on right or left side, she has been encouraged to sleep on her back. The right lateral malleolus ulcer is precariously close to peri-osteum; will order xray. The left lateral malleolus ulcer is improved. Will switch back to santyl; she will follow up next week. 05/17/18 on evaluation today patient actually appears to be doing very well in regard to her malleolus her ulcers compared to last time I saw them. She does not seem to have as much in the way of contusion at this point which is great news. With that being said she does continue to have discomfort and I do believe that  she is still continuing to benefit from the offloading/pressure reducing boots that were recommended. I think this is the key to trying to get this to heal up completely. 05/24/18 on evaluation today patient actually appears to be doing worse at this point in time unfortunately compared to her last week's evaluation. She is having really no increased pain which is good news unfortunately she does have more maceration in your theme and noted surrounding the right lateral ankle the left lateral ankle is not really is erythematous I do not see signs of the overt cellulitis on that side.  Unfortunately the wounds do not seem to have shown any signs of improvement since the last evaluation. She also has significant swelling especially on the right compared to previous some of this may be due to infection however also think that she may be served better while she has these wounds by compression wrapping versus continuing to use the Juxta-Lite for the time being. Especially with the amount of drainage that she is experiencing at this point. No fevers, chills, nausea, or vomiting noted at this time. 05/31/18 on evaluation today patient appears to actually be doing better in regard to her right lateral lower extremity ulcer specifically on the malleolus region. She has been tolerating the antibiotic without complication. With that being said she still continues to have issues but a little bit of redness although nothing like she what she was experiencing previous. She still continues to pressure to her ankle area she did get the problem on offloading boots unfortunately she will not wear them she states there too uncomfortable and she can't get in and out of the bed. Nonetheless at this point her wounds seem to be continually getting worse which is not what we want I'm getting somewhat concerned about her progress and how things are going to proceed if we do not intervene in some way shape or form. I therefore had a very lengthy conversation today about offloading yet again and even made a specific suggestion for switching her to a memory foam mattress and even gave the information for a specific one that they could look at getting if it was something that they were interested in considering. She does not want to be considered for a hospital bed air mattress although honestly insurance would not cover it that she does not have any wounds on her trunk. 06/14/18 on evaluation today both wounds over the bilateral lateral malleolus her ulcers appear to be doing better there's no evidence of  pressure injury at this point. She did get the foam mattress for her bed and this does seem to have been extremely beneficial for her in my pinion. Her daughter states that she is having difficulty getting out of bed because of how soft it is. The patient also relates this to be. Nonetheless I do feel like she's actually doing better. Unfortunately right after and around the time she was getting the mattress she also sustained a fall when she got up to go pick up the phone and ended up injuring her right elbow she has 18 sutures in place. We are not caring for this currently although home health is going to be taking the sutures out shortly. Nonetheless this may be something that we need to evaluate going forward. It depends on how well it has or has not healed in the end. She also recently saw an orthopedic specialist for an injection in the right shoulder just before her fall unfortunately the fall seems  to have worsened her pain. 06/21/18 on evaluation today patient appears to be doing about the same in regard to her lateral malleolus ulcers. Both appear to be just a little bit deeper but again we are clinging away the necrotic and dead tissue which I think is why this is progressing towards a deeper realm as opposed to improving from my measurement standpoint in that regard. Nonetheless she has been tolerating the dressing changes she absolutely hates the memory foam mattress topper that was obtained for her nonetheless I do believe this is still doing excellent as far as taking care of excess pressure in regard to the lateral malleolus regions. She in fact has no pressure injury that I see whereas in weeks past it was week by week I was constantly seeing new pressure injuries. Overall I think it has been very beneficial for her. 07/03/18; patient arrives in my clinic today. She has deep punched out areas over her bilateral lateral malleoli. The area on the right has some more depth. [name], [name] ([id number]) We spent a lot of time today talking about pressure relief for these areas. This started when her daughter asked for a prescription for a memory foam mattress. I have never written a prescription for a mattress and I don't think insurances would pay for that on an ordinary bed. In any case he came up that she has foam boots that she refuses to wear. I would suggest going to these before any other offloading issues when she is in bed. They say she is meticulous about offloading this the rest of the day 07/10/18- She is seen in follow-up evaluation for bilateral, lateral malleolus ulcers. There is no improvement in the ulcers. She has purchased and is sleeping on a memory foam mattress/overlay, she has been using the offloading boots nightly over the past week. She has a follow up appointment with vascular medicine at the end of October, in my opinion this follow up should be expedited given her deterioration and suboptimal TBI results. We will order plain film xray of the left ankle as deeper structures are palpable; would consider having MRI, regardless of xray report(s). The ulcers will be treated with iodoflex/iodosorb, she is unable to safely change the dressings daily with santyl. 07/19/18 on evaluation today patient appears to be doing in general visually well in regard to her bilateral lateral malleolus ulcers. She has been tolerating the dressing changes without complication which is good news. With that being said we did have an x-ray performed on 07/12/18 which revealed a slight loosen see in the lateral portion of the distal left fibula which may represent artifact but underline lytic destruction or osteomyelitis could not be excluded. MRI was recommended. With that being said we can see about getting the patient scheduled for an MRI to further evaluate this area. In fact we have that scheduled currently for August 20 19,019. 07/26/18 on evaluation today patient's wound on  the right lateral ankle actually appears to be doing fairly well at this point in my pinion. She has made some good progress currently. With that being said unfortunately in regard to the left lateral ankle ulcer this seems to be a little bit more problematic at this time. In fact as I further evaluated the situation she actually had bone exposed which is the first time that's been the case in the bone appear to be necrotic. Currently I did review patient's note from Dr. Bunnie Domino office with [facility]. He  stated that ABI was 1.26 on the right and 0.95 on the left with good waveforms. Her perfusion is stable not reduced from previous studies and her digital waveforms were pretty good particularly on the right. His conclusion upon review of the note was that there was not much she could do to improve her perfusion and he felt she was adequate for wound healing. His suggestion was that she continued to see [place] and consider a synthetic skin graft if there was no underlying infection. He plans to see her back in six months or as needed. 08/01/18 on evaluation today patient appears to be doing better in regard to her right lateral ankle ulcer. Her left lateral ankle ulcer is about the same she still has bone involvement in evidence of necrosis. There does not appear to be evidence of infection at this time On the right lateral lower extremity. I have started her on the Augmentin she picked this up and started this yesterday. This is to get her through until she sees infectious disease which is scheduled for 08/12/18. 08/06/18 on evaluation today patient appears to be doing rather well considering my discussion with patient's daughter at the end of last week. The area which was marked where she had erythema seems to be improved and this is good news. With that being said overall the patient seems to be making good improvement when it comes to the overall appearance of the right lateral  ankle ulcer although this has been slow she at least is coming around in this regard. Unfortunately in regard to the left lateral ankle ulcer this is osteomyelitis based on the pathology report as well is bone culture. Nonetheless we are still waiting CT scan. Unfortunately the MRI we originally ordered cannot be performed as the patient is a pacemaker which I had overlooked. Nonetheless we are working on the CT scan approval and scheduling as of now. She did go to the hospital over the weekend and was placed on IV Cefzo for a couple of days. Fortunately this seems to have improved the erythema quite significantly which is good news. There does not appear to be any evidence of worsening infection at this time. She did have some bleeding after the last debridement therefore I did not perform any sharp debridement in regard to left lateral ankle at this point. Patient has been approved for a snap vac for the right lateral ankle. 08/14/18; the patient with wounds over her bilateral lateral malleoli. The area on the right actually looks quite good. Been using a snap back on this area. Healthy granulation and appears to be filling in. Unfortunately the area on the left is really problematic. 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 Also noted to have cellulitis. She saw Dr. Novella Olive of infectious disease today and was put on linezolid. We are able to verify this with her pharmacy. She is completed the Augmentin that she was already on. We've been using Iodoflex to this area 08/23/18 on evaluation today patient's wounds both actually appear to be doing better compared to my prior evaluations. Fortunately she showing signs of good improvement in regard to the overall wound status especially where were using the snap vac on the right. In regard to left lateral malleolus the wound bed actually appears to be much cleaner than  previously noted. I do not feel any phone directly probed during evaluation today and though there is tendon  noted this does not appear [name], [name] ([id number]) to be necrotic it's actually fairly good as far as the overall appearance of the tendon is concerned. In general the wound bed actually appears to be doing significantly better than it was previous. Patient is currently in the care of [name] and I did review that note today. He actually has her on two weeks of linezolid and then following the patient will be on 1-2 months of Keflex. That is the plan currently. She has been on antibiotics therapy as prescribed by myself initially starting on July 30, 2018 and has been on that continuously up to this point. 08/30/18 on evaluation today patient actually appears to be doing much better in regard to her right lateral malleolus ulcer. She has been tolerating the dressing changes specifically the snap vac without complication although she did have some issues with the seal currently. Apparently there was some trouble with getting it to maintain over the past week past Sunday. Nonetheless overall the wound appears better in regard to the right lateral malleolus region. In regard to left lateral malleolus this actually show some signs of additional granulation although there still tendon noted in the base of the wound this appears to be healthy not necrotic in any way whatsoever. We are considering potentially using a snap vac for the left lateral malleolus as well the product wrap from [employer], [employer], was present in the clinic today we're going to see this patient I did have her come in with me after obtaining consent from the patient and her daughter in order to look at the wound and see if there's any recommendation one way or another as to whether or not they felt the snapback could be beneficial for the left lateral malleolus region. But the conclusion was that it might be but that this  is definitely a little bit deeper wound than what traditionally would be utilized for a snap vac. 09/06/18 on evaluation today patient actually appears to be doing excellent in my pinion in regard to both ankle ulcers. She has been tolerating the dressing changes without complication which is great news. Specifically we have been using the snap vac. In regard to the right ankle I'm not even sure that this is going to be necessary for today and following as the wound has filled in quite nicely. In regard to the left ankle I do believe that we're seeing excellent epithelialization from the edge as well as granulation in the central portion the tendon is still exposed but there's no evidence of necrotic bone and in general I feel like the patient has made excellent progress even compared to last week with just one week of the snap vac. 09/11/18; this is a patient who has wounds on her bilateral lateral malleoli. Initially both of these were deep stage IV wounds in the setting of chronic arterial insufficiency. She has been revascularized. As I understand think she been using snap vacs to both of these wounds however the area on the right became more superficial and currently she is only using it on the left. Using silver collagen on the right and silver collagen under the back on the left I believe 09/19/18 on evaluation today patient actually appears to be doing very well in regard to her lateral malleolus or ulcers bilaterally. She has been tolerating the dressing changes without complication. Fortunately there does not appear to be any evidence of infection at this time. Overall I feel like she is improving  in an excellent manner and I'm very pleased with the fact that everything seems to be turning towards the better for her. This has obviously been a long road. 09/27/18 on evaluation today patient actually appears to be doing very well in regard to her bilateral lateral malleolus ulcers. She has  been tolerating the dressing changes without complication. Fortunately there does not appear to be any evidence of infection at this time which is also great news. No fevers, chills, nausea, or vomiting noted at this time. Overall I feel like she is doing excellent with the snap vac on the left malleolus. She had 40 mL of fluid collection over the past week. 10/04/18 on evaluation today patient actually appears to be doing well in regard to her bilateral lateral malleolus ulcers. She continues to tolerate the dressing changes without complication. One issue that I see is the snap vac on the left lateral malleolus which appears to have sealed off some fluid underlying this area and has not really allowed it to heal to the degree that I would like to see. For that reason I did suggest at this point we may want to pack a small piece of packing strip into this region to allow it to more effectively wick out fluid. 10/11/18 in general the patient today does not feel that she has been doing very well. She's been a little bit lethargic and subsequently is having bodyaches as well according to what she tells me today. With that being said overall she has been concerned with the fact that something may be worsening although to be honest her wounds really have not been appearing poorly. She does have a new ulcer on her left heel unfortunately. This may be pressure related. Nonetheless it seems to me to have potentially started at least as a blister I do not see any evidence of deep tissue injury. In regard to the left ankle the snap vac still seems to be causing the ceiling off of the deeper part of the wound which is in turn trapping fluid. I'm not extremely pleased with the overall appearance as far as progress from last week to this week therefore I'm gonna discontinue the snap vac at this point. 10/18/18 patient unfortunately this point has not been feeling well for the past several days. She was seen by  Grayland Ormond her primary care provider who is a Librarian, academic at [facility]. Subsequently she states that she's been very weak and generally feeling malaise. No fevers, chills, nausea, or vomiting noted at this time. With that being said bloodwork was performed at the PCP office on the 11th of this month which showed a white blood cell count of 10.7. This was repeated today Looman, [name] ([id number]) and shows a white blood cell count of 12.4. This does show signs of worsening. Coupled with the fact that she is feeling worse and that her left ankle wound is not really showing signs of improvement I feel like this is an indication that the osteomyelitis is likely exacerbating not improving. Overall I think we may also want to check her C-reactive protein and sedimentation rate. Actually did call [facility] office this afternoon while the patient was in the office here with me. Subsequently based on the findings we discussed treatment possibilities and I think that it is appropriate for [place] to go ahead and initiate treatment with doxycycline which I'm going to do. Subsequently he did agree to see about adding a CRP and sedimentation rate to  her orders. If that has not already been drawn to where they can run it they will contact the patient she can come back to have that check. They are in agreement with plan as far as the patient and her daughter are concerned. Nonetheless also think we need to get in touch with Dr. Henreitta Leber office to see about getting the patient scheduled with him as soon as possible. 11/08/18 on evaluation today patient presents for follow-up concerning her bilateral foot and ankle ulcers. I did do an extensive review of her chart in epic today. Subsequently she was seen by [name] he did initiate Cefepime IV antibiotic therapy. Subsequently she had some issues with her PICC line this had to be removed because it was coiled and then replaced. Fortunately  that was now settled. Unfortunately she has continued have issues with her left heel as well as the issues that she is experiencing with her bilateral lateral malleolus regions. I do believe however both areas seem to be doing a little bit better on evaluation today which is good news. No fevers, chills, nausea, or vomiting noted at this time. She actually has an angiogram schedule with Dr. dew on this coming Monday, November 11, 2018. Subsequently the patient states that she is feeling much better especially than what she was roughly 2 weeks ago. She actually had to cancel an appointment because she was feeling so poorly. No fevers, chills, nausea, or vomiting noted at this time. 11/15/18 on evaluation today patient actually is status post having had her angiogram with Dr. dew Monday, four days ago. 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 He had to go and work on several areas of the vasculature fortunately he was able to obtain no more than a 30% residual stenosis throughout post procedure. I reviewed this note today. I think this will definitely help with healing at this time. Fortunately there does not appear to be any signs of infection and I do feel like ratio already has a better appearance to it. 11/22/18 upon evaluation today patient actually appears to be doing very well in regard to her wounds in general. The right lateral malleolus looks excellent the heel looks better in the left lateral malleolus also appears to be doing a little better. With that being said the right second toe actually appears to be open and training we been watching this is been dry and stable but now is open. 12/03/2018 Seen today for follow-up and management of multiple bilateral lower extremity wounds. New pressure injury of the great toe which is closed at this time. Wound of the right distal second toe appears larger today with deep undermining and a pocket of fluid present within  the undermining region. Left and right malleolus is wounds are stable today with no signs and symptoms of infection.Denies any needs or concerns during exam today. 12/13/18 on evaluation today patient appears to be doing somewhat better in regard to her left heel ulcer. She also seems to be completely healed in regard to the right lateral malleolus ulcer. The left malleolus ulcer is smaller what unfortunately the wounds which are new over the first and second toes of the right foot are what are most concerning at this point especially the second. Both areas did require sharp debridement today. 12/20/18 on evaluation today patient's wound actually appears to be doing better in regard to left lateral ankle and her right lateral ankle continues to remain healed. The hill  ulcer on the left is improved. She does have improvement noted as well in regard to both toe ulcers. Overall I'm very pleased in this regard. No fevers, chills, nausea, or vomiting noted at this time. 12/23/18 on evaluation today patient is seen after she had her toenails trimmed at the podiatrist office due to issues with her right great toe. There was what appeared to be dark eschar on the surface of the wound which had her in the podiatrist concerned. Nonetheless as I remember that during the last office visit I had utilize silver nitrate of this area I was much less concerned about the situation. Subsequently I was able to clean off much of this tissue without any complication today. This does not appear to show any signs of infection and actually look somewhat better compared to last time post debridement. Her second toe on the right foot actually had callous over and there did appear still be some fluid underneath this that would require debridement today. 12/27/18 on evaluation today patient actually appears to be showing signs of improvement at all locations. Even the left lateral ankle although this is not quite as great as the  other sites. Fortunately there does not appear to be any signs of infection at this time and both of her toes on the right foot seem to be showing signs of improvement which is good news and very pleased in this regard. 01/03/19 on evaluation today patient appears to be doing better for the most part in regard to her wounds in particular. There Harpenau, [name] ([id number]) does not appear to be any evidence of infection at this time which is good news. Fortunately there is no sign of really worsening anywhere except for the right great toe which she does have what appears to be a bruise/deep tissue injury which is very superficial and already resolving. I'm not sure where this came from I questioned her extensively and she does not recall what may have happened with this. Other than that the patient seems to be doing well even the left lateral ankle ulcer looks good and is getting smaller. 01/10/19 on evaluation today patient appears to be doing well in regard to her left heel wound and both of her toe wounds. Overall I feel like there is definitely improvement here and I'm happy in that regard. With that being said unfortunately she is having issues with the left lateral malleolus ulcer which unfortunately still has a lot of depth to it. This is gonna be a very difficult wound for [place] to be able to truly get to heal. I may want to consider some type of skin substitute to see if this would be of benefit for her. I'll discuss this with her more the next visit most likely. This was something I thought about more at the end of the visit when I was Artie out of the room and the patient had been discharged. Electronic Signature(s) Signed: 01/12/2019 3:37:27 PM By: Worthy Keeler PA-C Entered By: Worthy Keeler on 01/10/2019 12:34:25 [name], [name] ([id number]) -------------------------------------------------------------------------------- Callus Pairing Details Patient Name: [name] Date of Service: 01/10/2019 10:30 AM Medical Record Number: [id number] Patient Account Number: [id number] Date of Birth/Sex: 11-Sep-1923 (85 y.o. F) Treating RN: Cornell Barman Primary Care Provider: Grayland Ormond Other Clinician: Referring Provider: Grayland Ormond Treating Provider/Extender: Melburn Hake, Maximillian Habibi Weeks in Treatment: 74 Procedure Performed for: Wound #4 Right,Distal Toe Second Performed By: Physician STONE III, Avant Printy E., PA-C Post Procedure Diagnosis Same  as Therapist, sports) Signed: 01/14/2019 11:53:49 AM By: Gretta Cool, BSN, RN, CWS, Kim RN, BSN Entered By: Gretta Cool, BSN, RN, CWS, Kim on 01/10/2019 11:26:18 [name] ([id number]) -------------------------------------------------------------------------------- Physical Exam Details Patient Name: RUBBIE, GOOSTREE Date of Service: 01/10/2019 10:30 AM Medical Record Number: [id number] Patient Account Number: [id number] Date of Birth/Sex: Sep 23, 1923 (85 y.o. F) Treating RN: Montey Hora Primary Care Provider: Grayland Ormond Other Clinician: Referring Provider: Grayland Ormond Treating Provider/Extender: STONE III, Johari Pinney Weeks in Treatment: 22 Constitutional Well-nourished and well-hydrated in no acute distress. Respiratory normal breathing without difficulty. Psychiatric this patient is able to make decisions and demonstrates good insight into disease process. Alert and Oriented x 3. pleasant and cooperative. Notes Patient's wound bed currently did require sharp debridement in regard to both toes although no other sites require debridement today. Post debridement the wound bed's appear to be doing much better all of the dark callous over the first toe of the right foot completely cleared often this looks to be doing much better. That's excellent news. Electronic Signature(s) Signed: 01/12/2019 3:37:27 PM By: Worthy Keeler PA-C Entered By: Worthy Keeler on 01/10/2019 12:35:00 [name], [name]  ([id number]) -------------------------------------------------------------------------------- Physician Orders Details Patient Name: [name] Date of Service: 01/10/2019 10:30 AM Medical Record Number: [id number] Patient Account Number: [id number] Date of Birth/Sex: 04-22-1923 (85 y.o. F) Treating RN: Cornell Barman Primary Care Provider: Grayland Ormond Other Clinician: Referring Provider: Grayland Ormond Treating Provider/Extender: Melburn Hake, Regan Mcbryar Weeks in Treatment: 30 Verbal / Phone Orders: No Diagnosis Coding ICD-10 Coding Code Description 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, stage 4 L89.524 Pressure ulcer of left ankle, stage 4 L89.623 Pressure ulcer of left heel, stage 3 L89.892 Pressure ulcer of other site, stage 2 I70.233 Atherosclerosis of native arteries of right leg with ulceration of ankle I70.243 Atherosclerosis of native arteries of left leg with ulceration of ankle I89.0 Lymphedema, not elsewhere classified B35.4 Tinea corporis 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 o Cleanse wound with mild soap and water o May Shower, gently pat wound dry prior to applying new dressing. Wound #3 Left Calcaneus o Clean wound with Normal Saline. o Cleanse wound with mild soap and water o May Shower, gently pat wound dry prior to applying new dressing. Wound #4 Right,Distal Toe Second o Clean wound with Normal Saline. o Cleanse wound with mild soap and water o May Shower, gently pat wound dry prior to applying new dressing. Wound #5 Right,Distal Toe Great o Clean wound with Normal Saline. o Cleanse wound with mild soap and water o May Shower, gently pat wound dry prior to applying new dressing. Anesthetic (add to Medication List) Wound #2 Left,Lateral Malleolus o Topical Lidocaine 4% cream applied to wound bed prior to debridement (In Clinic  Only). Wound #3 Left Calcaneus o Topical Lidocaine 4% cream applied to wound bed prior to debridement (In Clinic Only). Wound #4 Right,Distal Toe Second o Topical Lidocaine 4% cream applied to wound bed prior to debridement (In Clinic Only). [name], [name] ([id number]) Wound #5 Right,Distal Toe Great o Topical Lidocaine 4% cream applied to wound bed prior to debridement (In Clinic Only). Skin Barriers/Peri-Wound Care Wound #4 Right,Distal Toe Second o Other: - Mupiricin Wound #5 Right,Distal Toe Great o Other: - Mupiricin Primary Wound Dressing Wound #2 Left,Lateral Malleolus o Iodoform packing Gauze Wound #3 Left Calcaneus o Silver Collagen Secondary Dressing Wound #2 Left,Lateral Malleolus o Boardered Foam Dressing -  Please keep a bordered foam dressing on the right malleolus with bactroban o Other - Coverlet Wound #3 Left Calcaneus o Boardered Foam Dressing - Please keep a bordered foam dressing on the right malleolus with bactroban o Other - Coverlet Wound #4 Right,Distal Toe Second o Other - Coverlet Wound #5 Right,Distal Toe Great o Other - Coverlet Dressing Change Frequency Wound #2 Left,Lateral Malleolus o Change Dressing Monday, Wednesday, Friday Wound #3 Left Calcaneus o Change Dressing Monday, Wednesday, Friday Wound #4 Right,Distal Toe Second o Change Dressing Monday, Wednesday, Friday Wound #5 Right,Distal Toe Great o Change Dressing Monday, Wednesday, Friday Follow-up Appointments Wound #2 Left,Lateral Malleolus o Return Appointment in 1 week. Wound #3 Left Calcaneus o Return Appointment in 1 week. Wound #4 Right,Distal Toe Second o Return Appointment in 1 week. [name], [name] ([id number]) Wound #5 Right,Distal Toe Great o Return Appointment in 1 week. Edema Control Wound #2 Left,Lateral Malleolus o Patient to wear own compression stockings o Patient to wear own Velcro compression garment. o  Elevate legs to the level of the heart and pump ankles as often as possible Wound #3 Left Calcaneus o Patient to wear own compression stockings o Patient to wear own Velcro compression garment. o Elevate legs to the level of the heart and pump ankles as often as possible Off-Loading Wound #2 Left,Lateral Malleolus o Turn and reposition every 2 hours o Other: - do not put pressure on your ankles Wound #3 Left Calcaneus o Turn and reposition every 2 hours o Other: - do not put pressure on your ankles Wound #4 Right,Distal Toe Second o Turn and reposition every 2 hours o Other: - do not put pressure on your ankles Wound #5 Right,Distal Toe Great o Turn and reposition every 2 hours o Other: - do not put pressure on your ankles Additional Orders / Instructions Wound #2 Left,Lateral Malleolus o Increase protein intake. o Increase protein intake. Wound #3 Left Calcaneus o Increase protein intake. o Increase protein intake. Wound #4 Right,Distal Toe Second o Increase protein intake. o Increase protein intake. Wound #5 Right,Distal Toe Great o Increase protein intake. o Increase protein intake. Home Health Wound #2 [place] Visits - [facility]. Please visit patient Monday, Wednesday, and Friday o Home Health Nurse may visit [facility] to address patientos wound care needs. o FACE TO FACE ENCOUNTER: MEDICARE and MEDICAID PATIENTS: I certify that this patient is under my care and that I had a face-to-face encounter that meets the physician face-to-face encounter requirements with this [name], [name] ([id number]) patient on this date. The encounter with the patient was in whole or in part for the following MEDICAL CONDITION: (primary reason for [place]) MEDICAL NECESSITY: I certify, that based on my findings, NURSING services are a medically necessary home health service. HOME BOUND STATUS: I certify that  my clinical findings support that this patient is homebound (i.e., Due to illness or injury, pt requires aid of supportive devices such as crutches, cane, wheelchairs, walkers, the use of special transportation or the assistance of another person to leave their place of residence. There is a normal inability to leave the home and doing so requires considerable and taxing effort. Other absences are for medical reasons / religious services and are infrequent or of short duration when for other reasons). o If current dressing causes regression in wound condition, may D/C ordered dressing product/s and apply Normal Saline Moist Dressing daily until next [place] / Other MD appointment. Notify [facility] of regression in wound condition at [phone]. o Please direct any NON-WOUND related issues/requests for orders to patient's Primary Care Physician Wound #3 Left [place] Visits - [facility]. Please visit patient Monday, Wednesday, and Friday o Home Health Nurse may visit [facility] to address patientos wound care needs. o FACE TO FACE ENCOUNTER: MEDICARE and MEDICAID PATIENTS: I certify that this patient is under my care and that I had a face-to-face encounter that meets the physician face-to-face encounter requirements with this patient on this date. The encounter with the patient was in whole or in part for the following MEDICAL CONDITION: (primary reason for [place]) MEDICAL NECESSITY: I certify, that based on my findings, NURSING services are a medically necessary home health service. HOME BOUND STATUS: I certify that my clinical findings support that this patient is homebound (i.e., Due to illness or injury, pt requires aid of supportive devices such as crutches, cane, wheelchairs, walkers, the use of special transportation or the assistance of another person to leave their place of residence. There is a normal inability to leave the  home and doing so requires considerable and taxing effort. Other absences are for medical reasons / religious services and are infrequent or of short duration when for other reasons). o If current dressing causes regression in wound condition, may D/C ordered dressing product/s and apply Normal Saline Moist Dressing daily until next [place] / Other MD appointment. [place] of regression in wound condition at [phone]. o Please direct any NON-WOUND related issues/requests for orders to patient's Primary Care Physician Wound #4 Right,Distal Toe [place] Visits - [facility]. Please visit patient Monday, Wednesday, and Friday o Home Health Nurse may visit PRN to address patientos wound care needs. o FACE TO FACE ENCOUNTER: MEDICARE and MEDICAID PATIENTS: I certify that this patient is under my care and that I had a face-to-face encounter that meets the physician face-to-face encounter requirements with this patient on this date. The encounter with the patient was in whole or in part for the following MEDICAL CONDITION: (primary reason for [place]) MEDICAL NECESSITY: I certify, that based on my findings, NURSING services are a medically necessary home health service. HOME BOUND STATUS: I certify that my clinical findings support that this patient is homebound (i.e., Due to illness or injury, pt requires aid of supportive devices such as crutches, cane, wheelchairs, walkers, the use of special transportation or the assistance of another person to leave their place of residence. There is a normal inability to leave the home and doing so requires considerable and taxing effort. Other absences are for medical reasons / religious services and are infrequent or of short duration when for other reasons). o If current dressing causes regression in wound condition, may D/C ordered dressing product/s and apply Normal Saline Moist  Dressing daily until next [place] / Other MD appointment. [place] of regression in wound condition at [phone]. o Please direct any NON-WOUND related issues/requests for orders to patient's Primary Care Physician Wound #5 Right,Distal Toe [place] [facility]its - [facility]. Please visit patient Monday, Wednesday, and Friday o Home Health Nurse may visit PRN to address patientos wound care needs. o FACE TO FACE ENCOUNTER: MEDICARE and MEDICAID PATIENTS: I certify that this patient is under my care and that I had a face-to-face encounter that meets the physician face-to-face encounter requirements with this patient on this date. The encounter with the  patient was in whole or in part for the following MEDICAL CONDITION: (primary reason for [place]) MEDICAL NECESSITY: I certify, that based on my findings, NURSING services are a medically necessary home health service. HOME BOUND STATUS: I certify that my clinical findings support that this patient is homebound (i.e., Due to illness or injury, pt requires aid of supportive devices such as crutches, cane, wheelchairs, walkers, the use of special transportation or the TEYONA, NICHELSON ([id number]) assistance of another person to leave their place of residence. There is a normal inability to leave the home and doing so requires considerable and taxing effort. Other absences are for medical reasons / religious services and are infrequent or of short duration when for other reasons). o If current dressing causes regression in wound condition, may D/C ordered dressing product/s and apply Normal Saline Moist Dressing daily until next [place] / Other MD appointment. [place] of regression in wound condition at [phone]. o Please direct any NON-WOUND related issues/requests for orders to patient's Primary Care Physician Electronic Signature(s) Signed:  01/12/2019 3:37:27 PM By: Worthy Keeler PA-C Signed: 01/14/2019 11:53:49 AM By: Gretta Cool, BSN, RN, CWS, Kim RN, BSN Entered By: Gretta Cool, BSN, RN, CWS, Kim on 01/10/2019 11:42:33 Burtt, [name] ([id number]) -------------------------------------------------------------------------------- Problem List Details Patient Name: [name] Date of Service: 01/10/2019 10:30 AM Medical Record Number: [id number] Patient Account Number: [id number] Date of Birth/Sex: February 13, 1923 (85 y.o. 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 08/10/2017 No Yes ankle I70.243 Atherosclerosis of native arteries of left leg with ulceration of 07/10/2018 No Yes ankle I89.0 Lymphedema, not elsewhere classified 08/10/2017 No Yes B35.4 Tinea corporis 09/28/2017 No Yes 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, left ankle and foot 08/14/2018 No Yes Inactive Problems Resolved Problems VARNELL, DONATE ([id number]) ICD-10 Code Description Active Date Resolved Date S90.822A Blister (nonthermal), left foot, initial encounter 10/11/2018 10/11/2018 Electronic Signature(s) Signed: 01/12/2019 3:37:27 PM By: Worthy Keeler PA-C Entered By: Worthy Keeler on 01/10/2019 10:47:55 Sylvain, [name] ([id number]) -------------------------------------------------------------------------------- Progress Note Details Patient Name: [name] Date of Service: 01/10/2019  10:30 AM Medical Record Number: [id number] Patient Account Number: [id number] Date of Birth/Sex: 05-Jun-1923 (85 y.o. 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 Dr. Amalia Hailey the podiatrist saw her on 07/20/2017 and sent her to the wound center for possible hyperbaric oxygen therapy. past medical history of peripheral vascular disease, varicose veins, status post appendectomy, basal cell carcinoma excision from the left leg, cholecystectomy, pacemaker placement, right lower extremity angiography done by Dr. dew in March 2017 with placement of a  stent. there is also note of a successful ablation of the right small saphenous vein done which was reviewed by ultrasound on 10/24/2016. the patient had a right small saphenous vein ablation done on 10/20/2016. The patient has never been a smoker. She has been seen by Dr. Corene Cornea dew the vascular surgeon who most recently saw her on 06/15/2017 for evaluation of ongoing problems with right leg swelling. She had a lower extremity arterial duplex examination done(02/13/17) which showed patent distal right superficial femoral artery stent and above-the-knee popliteal stent without evidence of restenosis. The ABI was more than 1.3 on the right and more than 1.3 on the left. This was consistent with noncompressible arteries due to medial calcification. The right great toe pressure and PPG waveforms are within normal limits and the left  great toe pressure and PPG waveforms are decreased. he recommended she continue to wear her compression stockings and continue with elevation. She is scheduled to have a noninvasive arterial study in the near future 08/16/2017 -- had a lower extremity arterial duplex examination done which showed patent distal right superficial femoral artery stent and above-the-knee popliteal stent without evidence of restenosis. The ABI was more than 1.3 on the right and more than 1.3 on the left. This was consistent with noncompressible arteries due to medial calcification. The right great toe pressure and PPG waveforms are within normal limits and the left great toe pressure and PPG waveforms are decreased. the x-ray of the right ankle has not yet been done 08/24/2017 -- had a right ankle x-ray -- IMPRESSION:1. No fracture, bone lesion or evidence of osteomyelitis. 2. Lateral soft tissue swelling with a soft tissue ulcer. she has not yet seen the vascular surgeon for review 08/31/17 on evaluation today patient's wound appears to be showing signs of improvement. She still with her appointment with vascular in order to review her results of her vascular study and then determine if any intervention would be recommended at that time. No fevers, chills, nausea, or vomiting noted at this time. She has been tolerating the dressing changes without complication. 09/28/17 on evaluation today patient's wound appears to show signs of good improvement in regard to the granulation tissue which is surfacing. There is still a layer of slough covering the wound and the posterior portion is still significantly deeper than the anterior nonetheless there has been some good sign of things moving towards the better. She is going to go back to Dr. dew for reevaluation to ensure her blood flow is still appropriate. That will be before her next evaluation with [place] next week. No fevers, chills, nausea, or vomiting noted at this time.  Patient does have some discomfort rated to be a 3-4/10 depending on activity specifically cleansing the wound makes it worse. 10/05/2017 -- the patient was seen by Dr. Lucky Cowboy last week and noninvasive studies showed a normal right ABI with brisk [name], ZERTUCHE. ([id number]) triphasic waveforms consistent with no arterial insufficiency including normal digital pressures. The duplex showed a patent distal right SFA stent and the proximal SFA was also normal. He was pleased with her test and thought she should have enough of perfusion for normal wound healing. He would see her back in 6 months time. 12/21/17 on evaluation today patient appears to be doing fairly well in regard to her right lateral ankle wound. Unfortunately the main issue that she is expansion at this point is that she is having some issues with what appears to be some cellulitis in the right anterior  shin. She has also been noting a little bit of uncomfortable feeling especially last night and her ankle area. I'm afraid that she made the developing a little bit of an infection. With that being said I think it is in the early stages. 12/28/17 on evaluation today patient's ankle appears to be doing excellent. She's making good progress at this point the cellulitis seems to have improved after last week's evaluation. Overall she is having no significant discomfort which is excellent news. She does have an appointment with Dr. dew on March 29, 2018 for reevaluation in regard to the stent he placed. She seems to have excellent blood flow in the right lower extremity. 01/19/12 on evaluation today patient's wound appears to be doing very well. In fact she does not appear to require debridement at this point, there's no evidence of infection, and overall from the standpoint of the wound she seems to be doing very well. With that being said I believe that it may be time to switch to different dressing away from the [facility] Dressing she  tells me she does have a lot going on her friend actually passed away yesterday and she's also having a lot of issues with her husband this obviously is weighing heavy on her as far as your thoughts and concerns today. 01/25/18 on evaluation today patient appears to be doing fairly well in regard to her right lateral malleolus. She has been tolerating the dressing changes without complication. Overall I feel like this is definitely showing signs of improvement as far as how the overall appearance of the wound is there's also evidence of epithelium start to migrate over the granulation tissue. In general I think that she is progressing nicely as far as the wound is concerned. The only concern she really has is whether or not we can switch to every other week visits in order to avoid having as many appointments as her daughters have a difficult time getting her to her appointments as well as the patient's husband to his he is not doing very well at this point. 02/22/18 on evaluation today patient's right lateral malleolus ulcer appears to be doing great. She has been tolerating the dressing changes without complication. Overall you making excellent progress at this time. Patient is having no significant discomfort. 03/15/18 on evaluation today patient appears to be doing much more poorly in regard to her right lateral ankle ulcer at this point. Unfortunately since have last seen her her husband has passed just a few days ago is obviously weighed heavily on her her daughter also had surgery well she is with her today as usual. There does not appear to be any evidence of infection she does seem to have significant contusion/deep tissue injury to the right lateral malleolus which was not noted previous when I saw her last. It's hard to tell of exactly when this injury occurred although during the time she was spending the night in the hospital this may have been most likely. 03/22/18 on evaluation today  patient appears to actually be doing very well in regard to her ulcer. She did unfortunately have a setback which was noted last week however the good news is we seem to be getting back on track and in fact the wound in the core did still have some necrotic tissue which will be addressed at this point today but in general I'm seeing signs that things are on the up and up. She is glad to hear this obviously she's been  somewhat concerned that due to the how her wound digressed more recently. 03/29/18 on evaluation today patient appears to be doing fairly well in regard to her right lower extremity lateral malleolus ulcer. She unfortunately does have a new area of pressure injury over the inferior portion where the wound has opened up a little bit larger secondary to the pressure she seems to be getting. She does tell me sometimes when she sleeps at night that it actually hurts and does seem to be pushing on the area little bit more unfortunately. There does not appear to be any evidence of infection which is good news. She has been tolerating the dressing changes without complication. She also did have some bruising in the left second and third toes due to the fact that she may have bump this or injured it although she has neuropathy so she does not feel she did move recently that may have been where this came from. Nonetheless there does not appear to be any evidence of infection at this time. 04/12/18 on evaluation today patient's wound on the right lateral ankle actually appears to be doing a little bit better with a lot of necrotic docking tissue centrally loosening up in clearing away. However she does have the beginnings of a deep tissue injury on the left lateral malleolus likely due to the fact we've been trying offload the right as much as we have. I think she may benefit from an assistive soft device to help with offloading and it looks like they're looking at one of the doughnut  conditions that wraps around the lower leg to offload which I think will definitely do a good job. With that being said I think we definitely need to address this issue on the left before it becomes a wound. Patient is not having significant pain. JAMESETTA, GREENHALGH ([id number]) 04/19/18 on evaluation today patient appears to be doing excellent in regard to the progress she's made with her right lateral ankle ulcer. The left ankle region which did show evidence of a deep tissue injury seems to be resolving there's little fluid noted underneath and a blister there's nothing open at this point in time overall I feel like this is progressing nicely which is good news. She does not seem to be having significant discomfort at this point which is also good news. 04/25/18-She is here in follow up evaluation for bilateral lateral malleolar ulcers. The right lateral malleolus ulcer with pale subcutaneous tissue exposure, central area of ulcer with tendon/periosteum exposed. The left lateral malleolus ulcer now with central area of nonviable tissue, otherwise deep tissue injury. She is wearing compression wraps to the left lower extremity, she will place the right lower extremity compression wraps on when she gets home. She will be out of town over the weekend and return next week and follow-up appointment. She completed her doxycycline this morning 05/03/18 on evaluation today patient appears to be doing very well in regard to her right lateral ankle ulcer in general. At least she's showing some signs of improvement in this regard. Unfortunately she has some additional injury to the left lateral malleolus region which appears to be new likely even over the past several days. Again this determination is based on the overall appearance. With that being said the patient is obviously frustrated about this currently. 05/10/18-She is here in follow-up evaluation for bilateral lateral malleolar ulcers. She states she  has purchased offloading shoes/boots and they will arrive tomorrow. She was asked to bring them  in the office at next week's appointment so her provider is aware of product being utilized. She continues to sleep on right or left side, she has been encouraged to sleep on her back. The right lateral malleolus ulcer is precariously close to peri-osteum; will order xray. The left lateral malleolus ulcer is improved. Will switch back to santyl; she will follow up next week. 05/17/18 on evaluation today patient actually appears to be doing very well in regard to her malleolus her ulcers compared to last time I saw them. She does not seem to have as much in the way of contusion at this point which is great news. With that being said she does continue to have discomfort and I do believe that she is still continuing to benefit from the offloading/pressure reducing boots that were recommended. I think this is the key to trying to get this to heal up completely. 05/24/18 on evaluation today patient actually appears to be doing worse at this point in time unfortunately compared to her last week's evaluation. She is having really no increased pain which is good news unfortunately she does have more maceration in your theme and noted surrounding the right lateral ankle the left lateral ankle is not really is erythematous I do not see signs of the overt cellulitis on that side. Unfortunately the wounds do not seem to have shown any signs of improvement since the last evaluation. She also has significant swelling especially on the right compared to previous some of this may be due to infection however also think that she may be served better while she has these wounds by compression wrapping versus continuing to use the Juxta-Lite for the time being. Especially with the amount of drainage that she is experiencing at this point. No fevers, chills, nausea, or vomiting noted at this time. 05/31/18 on evaluation today  patient appears to actually be doing better in regard to her right lateral lower extremity ulcer specifically on the malleolus region. She has been tolerating the antibiotic without complication. With that being said she still continues to have issues but a little bit of redness although nothing like she what she was experiencing previous. She still continues to pressure to her ankle area she did get the problem on offloading boots unfortunately she will not wear them she states there too uncomfortable and she can't get in and out of the bed. Nonetheless at this point her wounds seem to be continually getting worse which is not what we want I'm getting somewhat concerned about her progress and how things are going to proceed if we do not intervene in some way shape or form. I therefore had a very lengthy conversation today about offloading yet again and even made a specific suggestion for switching her to a memory foam mattress and even gave the information for a specific one that they could look at getting if it was something that they were interested in considering. She does not want to be considered for a hospital bed air mattress although honestly insurance would not cover it that she does not have any wounds on her trunk. 06/14/18 on evaluation today both wounds over the bilateral lateral malleolus her ulcers appear to be doing better there's no evidence of pressure injury at this point. She did get the foam mattress for her bed and this does seem to have been extremely beneficial for her in my pinion. Her daughter states that she is having difficulty getting out of bed because of how soft  it is. The patient also relates this to be. Nonetheless I do feel like she's actually doing better. Unfortunately right after and around the time she was getting the mattress she also sustained a fall when she got up to go pick up the phone and ended up injuring her right elbow she has 18 sut[address] place. We  are not caring for this currently although home health is going to be taking the sutures out shortly. Nonetheless this may be something that we need to evaluate going forward. It depends on how well it has or has not healed in the end. She also recently saw an orthopedic specialist for an injection in the right shoulder just before her fall unfortunately the fall seems to have worsened her pain. 06/21/18 on evaluation today patient appears to be doing about the same in regard to her lateral malleolus ulcers. Both appear to be just a little bit deeper but again we are clinging away the necrotic and dead tissue which I think is why this is progressing towards a deeper realm as opposed to improving from my measurement standpoint in that regard. Nonetheless she has been tolerating the dressing changes she absolutely hates the memory foam mattress topper that was obtained for her nonetheless Dazja, [name] ([id number]) I do believe this is still doing excellent as far as taking care of excess pressure in regard to the lateral malleolus regions. She in fact has no pressure injury that I see whereas in weeks past it was week by week I was constantly seeing new pressure injuries. Overall I think it has been very beneficial for her. 07/03/18; patient arrives in my clinic today. She has deep punched out areas over her bilateral lateral malleoli. The area on the right has some more depth. We spent a lot of time today talking about pressure relief for these areas. This started when her daughter asked for a prescription for a memory foam mattress. I have never written a prescription for a mattress and I don't think insurances would pay for that on an ordinary bed. In any case he came up that she has foam boots that she refuses to wear. I would suggest going to these before any other offloading issues when she is in bed. They say she is meticulous about offloading this the rest of the day 07/10/18- She is seen  in follow-up evaluation for bilateral, lateral malleolus ulcers. There is no improvement in the ulcers. She has purchased and is sleeping on a memory foam mattress/overlay, she has been using the offloading boots nightly over the past week. She has a follow up appointment with vascular medicine at the end of October, in my opinion this follow up should be expedited given her deterioration and suboptimal TBI results. We will order plain film xray of the left ankle as deeper structures are palpable; would consider having MRI, regardless of xray report(s). The ulcers will be treated with iodoflex/iodosorb, she is unable to safely change the dressings daily with santyl. 07/19/18 on evaluation today patient appears to be doing in general visually well in regard to her bilateral lateral malleolus ulcers. She has been tolerating the dressing changes without complication which is good news. With that being said we did have an x-ray performed on 07/12/18 which revealed a slight loosen see in the lateral portion of the distal left fibula which may represent artifact but underline lytic destruction or osteomyelitis could not be excluded. MRI was recommended. With that being said we can see about  getting the patient scheduled for an MRI to further evaluate this area. In fact we have that scheduled currently for August 20 19,019. 07/26/18 on evaluation today patient's wound on the right lateral ankle actually appears to be doing fairly well at this point in my pinion. She has made some good progress currently. With that being said unfortunately in regard to the left lateral ankle ulcer this seems to be a little bit more problematic at this time. In fact as I further evaluated the situation she actually had bone exposed which is the first time that's been the case in the bone appear to be necrotic. Currently I did review patient's note from Dr. Bunnie Domino office with [facility]. He stated that ABI  was 1.26 on the right and 0.95 on the left with good waveforms. Her perfusion is stable not reduced from previous studies and her digital waveforms were pretty good particularly on the right. His conclusion upon review of the note was that there was not much she could do to improve her perfusion and he felt she was adequate for wound healing. His suggestion was that she continued to see [place] and consider a synthetic skin graft if there was no underlying infection. He plans to see her back in six months or as needed. 08/01/18 on evaluation today patient appears to be doing better in regard to her right lateral ankle ulcer. Her left lateral ankle ulcer is about the same she still has bone involvement in evidence of necrosis. There does not appear to be evidence of infection at this time On the right lateral lower extremity. I have started her on the Augmentin she picked this up and started this yesterday. This is to get her through until she sees infectious disease which is scheduled for 08/12/18. 08/06/18 on evaluation today patient appears to be doing rather well considering my discussion with patient's daughter at the end of last week. The area which was marked where she had erythema seems to be improved and this is good news. With that being said overall the patient seems to be making good improvement when it comes to the overall appearance of the right lateral ankle ulcer although this has been slow she at least is coming around in this regard. Unfortunately in regard to the left lateral ankle ulcer this is osteomyelitis based on the pathology report as well is bone culture. Nonetheless we are still waiting CT scan. Unfortunately the MRI we originally ordered cannot be performed as the patient is a pacemaker which I had overlooked. Nonetheless we are working on the CT scan approval and scheduling as of now. She did go to the hospital over the weekend and was placed on IV Cefzo for a couple of days.  Fortunately this seems to have improved the erythema quite significantly which is good news. There does not appear to be any evidence of worsening infection at this time. She did have some bleeding after the last debridement therefore I did not perform any sharp debridement in regard to left lateral ankle at this point. Patient has been approved for a snap vac for the right lateral ankle. 08/14/18; the patient with wounds over her bilateral lateral malleoli. The area on the right actually looks quite good. Been using a snap back on this area. Healthy granulation and appears to be filling in. Unfortunately the area on the left is really problematic. She had a recent CT scan on 08/13/18 that showed findings consistent with osteomyelitis of the  lateral malleolus on the left. Also noted to have cellulitis. She saw Dr. Novella Olive of infectious disease today and was put on linezolid. We are able to verify this with her pharmacy. She is completed the Augmentin that she was already on. We've been using Iodoflex to this LELIA, JONS ([id number]) area 08/23/18 on evaluation today patient's wounds both actually appear to be doing better compared to my prior evaluations. Fortunately she showing signs of good improvement in regard to the overall wound status especially where were using the snap vac on the right. In regard to left lateral malleolus the wound bed actually appears to be much cleaner than previously noted. I do not feel any phone directly probed during evaluation today and though there is tendon noted this does not appear to be necrotic it's actually fairly good as far as the overall appearance of the tendon is concerned. In general the wound bed actually appears to be doing significantly better than it was previous. Patient is currently in the care of [name] and I did review that note today. He actually has her on two weeks of linezolid and then following the patient will be on 1-2 months of  Keflex. That is the plan currently. She has been on antibiotics therapy as prescribed by myself initially starting on July 30, 2018 and has been on that continuously up to this point. 08/30/18 on evaluation today patient actually appears to be doing much better in regard to her right lateral malleolus ulcer. She has been tolerating the dressing changes specifically the snap vac without complication although she did have some issues with the seal currently. Apparently there was some trouble with getting it to maintain over the past week past Sunday. Nonetheless overall the wound appears better in regard to the right lateral malleolus region. In regard to left lateral malleolus this actually show some signs of additional granulation although there still tendon noted in the base of the wound this appears to be healthy not necrotic in any way whatsoever. We are considering potentially using a snap vac for the left lateral malleolus as well the product wrap from [employer], [place], was present in the clinic today we're going to see this patient I did have her come in with me after obtaining consent from the patient and her daughter in order to look at the wound and see if there's any recommendation one way or another as to whether or not they felt the snapback could be beneficial for the left lateral malleolus region. But the conclusion was that it might be but that this is definitely a little bit deeper wound than what traditionally would be utilized for a snap vac. 09/06/18 on evaluation today patient actually appears to be doing excellent in my pinion in regard to both ankle ulcers. She has been tolerating the dressing changes without complication which is great news. Specifically we have been using the snap vac. In regard to the right ankle I'm not even sure that this is going to be necessary for today and following as the wound has filled in quite nicely. In regard to the left ankle I do believe that we're  seeing excellent epithelialization from the edge as well as granulation in the central portion the tendon is still exposed but there's no evidence of necrotic bone and in general I feel like the patient has made excellent progress even compared to last week with just one week of the snap vac. 09/11/18; this is a patient who has wounds  on her bilateral lateral malleoli. Initially both of these were deep stage IV wounds in the setting of chronic arterial insufficiency. She has been revascularized. As I understand think she been using snap vacs to both of these wounds however the area on the right became more superficial and currently she is only using it on the left. Using silver collagen on the right and silver collagen under the back on the left I believe 09/19/18 on evaluation today patient actually appears to be doing very well in regard to her lateral malleolus or ulcers bilaterally. She has been tolerating the dressing changes without complication. Fortunately there does not appear to be any evidence of infection at this time. Overall I feel like she is improving in an excellent manner and I'm very pleased with the fact that everything seems to be turning towards the better for her. This has obviously been a long road. 09/27/18 on evaluation today patient actually appears to be doing very well in regard to her bilateral lateral malleolus ulcers. She has been tolerating the dressing changes without complication. Fortunately there does not appear to be any evidence of infection at this time which is also great news. No fevers, chills, nausea, or vomiting noted at this time. Overall I feel like she is doing excellent with the snap vac on the left malleolus. She had 40 mL of fluid collection over the past week. 10/04/18 on evaluation today patient actually appears to be doing well in regard to her bilateral lateral malleolus ulcers. She continues to tolerate the dressing changes without complication.  One issue that I see is the snap vac on the left lateral malleolus which appears to have sealed off some fluid underlying this area and has not really allowed it to heal to the degree that I would like to see. For that reason I did suggest at this point we may want to pack a small piece of packing strip into this region to allow it to more effectively wick out fluid. 10/11/18 in general the patient today does not feel that she has been doing very well. She's been a little bit lethargic and subsequently is having bodyaches as well according to what she tells me today. With that being said overall she has been concerned with the fact that something may be worsening although to be honest her wounds really have not been appearing poorly. She does have a new ulcer on her left heel unfortunately. This may be pressure related. Nonetheless it seems to me to have potentially started at least as a blister I do not see any evidence of deep tissue injury. In regard to the left ankle the snap vac still seems to be causing the ceiling off of the deeper part of the wound which is in turn trapping fluid. I'm not extremely pleased with the overall appearance as far as progress from last week to this week therefore I'm gonna discontinue Slevin, [name] ([id number]) the snap vac at this point. 10/18/18 patient unfortunately this point has not been feeling well for the past several days. She was seen by Grayland Ormond her primary care provider who is a Librarian, academic at [facility]. Subsequently she states that she's been very weak and generally feeling malaise. No fevers, chills, nausea, or vomiting noted at this time. With that being said bloodwork was performed at the PCP office on the 11th of this month which showed a white blood cell count of 10.7. This was repeated today and shows a white blood  cell count of 12.4. This does show signs of worsening. Coupled with the fact that she is feeling worse and  that her left ankle wound is not really showing signs of improvement I feel like this is an indication that the osteomyelitis is likely exacerbating not improving. Overall I think we may also want to check her C-reactive protein and sedimentation rate. Actually did call [facility] Fleet office this afternoon while the patient was in the office here with me. Subsequently based on the findings we discussed treatment possibilities and I think that it is appropriate for [place] to go ahead and initiate treatment with doxycycline which I'm going to do. Subsequently he did agree to see about adding a CRP and sedimentation rate to her orders. If that has not already been drawn to where they can run it they will contact the patient she can come back to have that check. They are in agreement with plan as far as the patient and her daughter are concerned. Nonetheless also think we need to get in touch with Dr. Henreitta Leber office to see about getting the patient scheduled with him as soon as possible. 11/08/18 on evaluation today patient presents for follow-up concerning her bilateral foot and ankle ulcers. I did do an extensive review of her chart in epic today. Subsequently she was seen by [name] he did initiate Cefepime IV antibiotic therapy. Subsequently she had some issues with her PICC line this had to be removed because it was coiled and then replaced. Fortunately that was now settled. Unfortunately she has continued have issues with her left heel as well as the issues that she is experiencing with her bilateral lateral malleolus regions. I do believe however both areas seem to be doing a little bit better on evaluation today which is good news. No fevers, chills, nausea, or vomiting noted at this time. She actually has an angiogram schedule with Dr. dew on this coming Monday, November 11, 2018. Subsequently the patient states that she is feeling much better especially than what she was roughly 2 weeks ago. She  actually had to cancel an appointment because she was feeling so poorly. No fevers, chills, nausea, or vomiting noted at this time. 11/15/18 on evaluation today patient actually is status post having had her angiogram with Dr. dew Monday, four days ago. 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 He had to go and work on several areas of the vasculature fortunately he was able to obtain no more than a 30% residual stenosis throughout post procedure. I reviewed this note today. I think this will definitely help with healing at this time. Fortunately there does not appear to be any signs of infection and I do feel like ratio already has a better appearance to it. 11/22/18 upon evaluation today patient actually appears to be doing very well in regard to her wounds in general. The right lateral malleolus looks excellent the heel looks better in the left lateral malleolus also appears to be doing a little better. With that being said the right second toe actually appears to be open and training we been watching this is been dry and stable but now is open. 12/03/2018 Seen today for follow-up and management of multiple bilateral lower extremity wounds. New pressure injury of the great toe which is closed at this time. Wound of the right distal second toe appears larger today with deep undermining and a pocket of fluid present within the undermining region. Left  and right malleolus is wounds are stable today with no signs and symptoms of infection.Denies any needs or concerns during exam today. 12/13/18 on evaluation today patient appears to be doing somewhat better in regard to her left heel ulcer. She also seems to be completely healed in regard to the right lateral malleolus ulcer. The left malleolus ulcer is smaller what unfortunately the wounds which are new over the first and second toes of the right foot are what are most concerning at this point especially the second. Both  areas did require sharp debridement today. 12/20/18 on evaluation today patient's wound actually appears to be doing better in regard to left lateral ankle and her right lateral ankle continues to remain healed. The hill ulcer on the left is improved. She does have improvement noted as well in regard to both toe ulcers. Overall I'm very pleased in this regard. No fevers, chills, nausea, or vomiting noted at this time. 12/23/18 on evaluation today patient is seen after she had her toenails trimmed at the podiatrist office due to issues with her right great toe. There was what appeared to be dark eschar on the surface of the wound which had her in the podiatrist concerned. Nonetheless as I remember that during the last office visit I had utilize silver nitrate of this area I was much less concerned about the situation. Subsequently I was able to clean off much of this tissue without any complication today. This does not appear to show any signs of infection and actually look somewhat better compared to last time post debridement. Her second toe on the right foot actually had callous over and there did appear still be some fluid underneath this that would require debridement today. DOROTHYE, BERNI ([id number]) 12/27/18 on evaluation today patient actually appears to be showing signs of improvement at all locations. Even the left lateral ankle although this is not quite as great as the other sites. Fortunately there does not appear to be any signs of infection at this time and both of her toes on the right foot seem to be showing signs of improvement which is good news and very pleased in this regard. 01/03/19 on evaluation today patient appears to be doing better for the most part in regard to her wounds in particular. There does not appear to be any evidence of infection at this time which is good news. Fortunately there is no sign of really worsening anywhere except for the right great toe which she  does have what appears to be a bruise/deep tissue injury which is very superficial and already resolving. I'm not sure where this came from I questioned her extensively and she does not recall what may have happened with this. Other than that the patient seems to be doing well even the left lateral ankle ulcer looks good and is getting smaller. 01/10/19 on evaluation today patient appears to be doing well in regard to her left heel wound and both of her toe wounds. Overall I feel like there is definitely improvement here and I'm happy in that regard. With that being said unfortunately she is having issues with the left lateral malleolus ulcer which unfortunately still has a lot of depth to it. This is gonna be a very difficult wound for [place] to be able to truly get to heal. I may want to consider some type of skin substitute to see if this would be of benefit for her. I'll discuss this with her more the next visit most  likely. This was something I thought about more at the end of the visit when I was Artie out of the room and the patient had been discharged. Patient History Information obtained from Patient. Family History Cancer - Father,Siblings, Heart Disease - Siblings, No family history of Diabetes, Hereditary Spherocytosis, Hypertension, Kidney Disease, Lung Disease, Seizures, Stroke, Thyroid Problems, Tuberculosis. Social History Never smoker, Marital Status - Married, Alcohol Use - Never, Drug Use - No History, Caffeine Use - Rarely. Medical History Eyes Patient has history of Cataracts - surgery Cardiovascular Patient has history of Congestive Heart Failure, Hypertension Musculoskeletal Patient has history of Osteoarthritis Neurologic Patient has history of Neuropathy Oncologic Denies history of Received Chemotherapy, Received Radiation Review of Systems (ROS) Constitutional Symptoms (General Health) Denies complaints or symptoms of Fever, Chills. Respiratory The patient has no  complaints or symptoms. Cardiovascular The patient has no complaints or symptoms. Psychiatric The patient has no complaints or symptoms. ZILAH, VILLAFLOR ([id number]) Objective Constitutional Well-nourished and well-hydrated in no acute distress. Vitals Time Taken: 10:43 AM, Height: 65 in, Weight: 154.3 lbs, BMI: 25.7, Temperature: 97.7 F, Pulse: 86 bpm, Respiratory Rate: 18 breaths/min, Blood Pressure: 127/57 mmHg. Respiratory normal breathing without difficulty. Psychiatric this patient is able to make decisions and demonstrates good insight into disease process. Alert and Oriented x 3. pleasant and cooperative. General Notes: Patient's wound bed currently did require sharp debridement in regard to both toes although no other sites require debridement today. Post debridement the wound bed's appear to be doing much better all of the dark callous over the first toe of the right foot completely cleared often this looks to be doing much better. That's excellent news. Integumentary (Hair, Skin) Wound #2 status is Open. Original cause of wound was Gradually Appeared. The wound is located on the Left,Lateral Malleolus. The wound measures 0.7cm length x 0.6cm width x 0.2cm depth; 0.33cm^2 area and 0.066cm^3 volume. There is Fat Layer (Subcutaneous Tissue) Exposed exposed. There is no undermining noted, however, there is tunneling at 5:00 with a maximum distance of 2.2cm. There is a medium amount of serous drainage noted. The wound margin is epibole. There is large (67-100%) pink granulation within the wound bed. There is a small (1-33%) amount of necrotic tissue within the wound bed including Adherent Slough. The periwound skin appearance exhibited: Maceration, Erythema. The periwound skin appearance did not exhibit: Callus, Crepitus, Excoriation, Induration, Rash, Scarring, Dry/Scaly, Atrophie Blanche, Cyanosis, Ecchymosis, Hemosiderin Staining, Mottled, Pallor, Rubor. The surrounding wound  skin color is noted with erythema which is circumferential. Periwound temperature was noted as No Abnormality. The periwound has tenderness on palpation. Wound #3 status is Open. Original cause of wound was Gradually Appeared. The wound is located on the Left Calcaneus. The wound measures 0.9cm length x 1.3cm width x 0.1cm depth; 0.919cm^2 area and 0.092cm^3 volume. There is Fat Layer (Subcutaneous Tissue) Exposed exposed. There is no tunneling or undermining noted. There is a medium amount of serous drainage noted. The wound margin is indistinct and nonvisible. There is medium (34-66%) pink granulation within the wound bed. There is a medium (34-66%) amount of necrotic tissue within the wound bed including Adherent Slough. The periwound skin appearance exhibited: Dry/Scaly. The periwound skin appearance did not exhibit: Callus, Crepitus, Excoriation, Induration, Rash, Scarring, Maceration, Atrophie Blanche, Cyanosis, Ecchymosis, Hemosiderin Staining, Mottled, Pallor, Rubor, Erythema. Periwound temperature was noted as No Abnormality. The periwound has tenderness on palpation. Wound #4 status is Open. Original cause of wound was Pressure Injury. The wound is located on  the Right,Distal Toe Second. The wound measures 0.1cm length x 0.1cm width x 0.1cm depth; 0.008cm^2 area and 0.001cm^3 volume. The wound is limited to skin breakdown. There is no tunneling or undermining noted. There is a none present amount of drainage noted. The wound margin is indistinct and nonvisible. There is no granulation within the wound bed. There is a large (67- 100%) amount of necrotic tissue within the wound bed including Eschar. The periwound skin appearance exhibited: Ecchymosis. The periwound skin appearance did not exhibit: Callus, Crepitus, Excoriation, Induration, Rash, Scarring, Dry/Scaly, Maceration, Atrophie Blanche, Cyanosis, Hemosiderin Staining, Mottled, Pallor, Rubor, Erythema. Wound #5 status is Open.  Original cause of wound was Footwear Injury. The wound is located on the Right,Distal [employer] The wound measures 0.3cm length x 0.4cm width x 0.1cm depth; 0.094cm^2 area and 0.009cm^3 volume. There is Fat Layer (Subcutaneous Tissue) Exposed exposed. There is no tunneling or undermining noted. There is a small amount of serous drainage noted. The wound margin is flat and intact. There is small (1-33%) pink granulation within the wound bed. There is a large (67-100%) amount of necrotic tissue within the wound bed including Eschar and Adherent Slough. The periwound skin appearance exhibited: Ecchymosis. The periwound skin appearance did not exhibit: Callus, Crepitus, Excoriation, Induration, Rash, Scarring, Dry/Scaly, Maceration, Atrophie Blanche, Cyanosis, Hemosiderin Staining, Mottled, Pallor, Rubor, Erythema. 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 There was a Excisional Skin/Subcutaneous Tissue Debridement with a total area of 0.12 sq cm performed by STONE III, Tinslee Klare E., PA-C. With the following instrument(s): Curette to remove Viable and Non-Viable tissue/material. Material removed includes Callus, Subcutaneous Tissue, and Slough after achieving pain control using Lidocaine. No specimens were taken. A time out was conducted at 11:22, prior to the start of the procedure. A Minimum amount of bleeding was controlled with Pressure. The procedure was  tolerated well. Post Debridement Measurements: 0.3cm length x 0.4cm width x 0.3cm depth; 0.028cm^3 volume. Post debridement Stage noted as Category/Stage II. Character of Wound/Ulcer Post Debridement is stable. Post procedure Diagnosis Wound #5: Same as Pre-Procedure Wound #4 Pre-procedure diagnosis of Wound #4 is a Pressure Ulcer located on the Right,Distal Toe Second . An Callus Pairing procedure was performed by STONE III, Enio Hornback E., PA-C. Post procedure Diagnosis Wound #4: Same as Pre-Procedure Plan Wound Cleansing: Wound #2 Left,Lateral Malleolus: Clean wound with Normal Saline. Cleanse wound with mild soap and water May Shower, gently pat wound dry prior to applying new dressing. Wound #3 Left Calcaneus: Clean wound with Normal Saline. Cleanse wound with mild soap and water May Shower, gently pat wound dry prior to applying new dressing. Wound #4 Right,Distal Toe Second: [name] ([id number]) Clean wound with Normal Saline. Cleanse wound with mild soap and water May Shower, gently pat wound dry prior to applying new dressing. Wound #5 Right,Distal Toe Great: Clean wound with Normal Saline. Cleanse wound with mild soap and water May Shower, gently pat wound dry prior to applying new dressing. Anesthetic (add to Medication List): Wound #2 Left,Lateral Malleolus:  Topical Lidocaine 4% cream applied to wound bed prior to debridement (In Clinic Only). Wound #3 Left Calcaneus: Topical Lidocaine 4% cream applied to wound bed prior to debridement (In Clinic Only). Wound #4 Right,Distal Toe Second: Topical Lidocaine 4% cream applied to wound bed prior to debridement (In Clinic Only). Wound #5 Right,Distal Toe Great: Topical Lidocaine 4% cream applied to wound bed prior to debridement (In Clinic Only). Skin Barriers/Peri-Wound Care: Wound #4 Right,Distal Toe Second: Other: - Mupiricin Wound #5 Right,Distal Toe Great: Other: - Mupiricin Primary Wound Dressing: Wound #2  Left,Lateral Malleolus: Iodoform packing Gauze Wound #3 Left Calcaneus: Silver Collagen Secondary Dressing: Wound #2 Left,Lateral Malleolus: Boardered Foam Dressing - Please keep a bordered foam dressing on the right malleolus with bactroban Other - Coverlet Wound #3 Left Calcaneus: Boardered Foam Dressing - Please keep a bordered foam dressing on the right malleolus with bactroban Other - Coverlet Wound #4 Right,Distal Toe Second: Other - Coverlet Wound #5 Right,Distal Toe Great: Other - Coverlet Dressing Change Frequency: Wound #2 Left,Lateral Malleolus: Change Dressing Monday, Wednesday, Friday Wound #3 Left Calcaneus: Change Dressing Monday, Wednesday, Friday Wound #4 Right,Distal Toe Second: Change Dressing Monday, Wednesday, Friday Wound #5 Right,Distal Toe Great: Change Dressing Monday, Wednesday, Friday Follow-up Appointments: Wound #2 Left,Lateral Malleolus: Return Appointment in 1 week. Wound #3 Left Calcaneus: Return Appointment in 1 week. Wound #4 Right,Distal Toe Second: Return Appointment in 1 week. Wound #5 Right,Distal Toe Great: Return Appointment in 1 week. Edema Control: Wound #2 Left,Lateral Malleolus: Patient to wear own compression stockings Patient to wear own Velcro compression garment. Elevate legs to the level of the heart and pump ankles as often as possible Burnett, [name] ([id number]) Wound #3 Left Calcaneus: Patient to wear own compression stockings Patient to wear own Velcro compression garment. Elevate legs to the level of the heart and pump ankles as often as possible Off-Loading: Wound #2 Left,Lateral Malleolus: Turn and reposition every 2 hours Other: - do not put pressure on your ankles Wound #3 Left Calcaneus: Turn and reposition every 2 hours Other: - do not put pressure on your ankles Wound #4 Right,Distal Toe Second: Turn and reposition every 2 hours Other: - do not put pressure on your ankles Wound #5 Right,Distal Toe  Great: Turn and reposition every 2 hours Other: - do not put pressure on your ankles Additional Orders / Instructions: Wound #2 Left,Lateral Malleolus: Increase protein intake. Increase protein intake. Wound #3 Left Calcaneus: Increase protein intake. Increase protein intake. Wound #4 Right,Distal Toe Second: Increase protein intake. Increase protein intake. Wound #5 Right,Distal Toe Great: Increase protein intake. Increase protein intake. Home Health: Wound #2 Left,Lateral Malleolus: [place] Visits - [facility]. Please visit patient Monday, Wednesday, and Friday Home Health Nurse may visit PRN to address patient s wound care needs. FACE TO FACE ENCOUNTER: MEDICARE and MEDICAID PATIENTS: I certify that this patient is under my care and that I had a face-to-face encounter that meets the physician face-to-face encounter requirements with this patient on this date. The encounter with the patient was in whole or in part for the following MEDICAL CONDITION: (primary reason for [place]) MEDICAL NECESSITY: I certify, that based on my findings, NURSING services are a medically necessary home health service. HOME BOUND STATUS: I certify that my clinical findings support that this patient is homebound (i.e., Due to illness or injury, pt requires aid of supportive devices such as crutches, cane, wheelchairs, walkers, the use of special transportation or the assistance of another person to  leave their place of residence. There is a normal inability to leave the home and doing so requires considerable and taxing effort. Other absences are for medical reasons / religious services and are infrequent or of short duration when for other reasons). If current dressing causes regression in wound condition, may D/C ordered dressing product/s and apply Normal Saline Moist Dressing daily until next [place] / Other MD appointment. [place] of regression  in wound condition at [phone]. Please direct any NON-WOUND related issues/requests for orders to patient's Primary Care Physician Wound #3 Left Calcaneus: [place] Visits - [facility]. Please visit patient Monday, Wednesday, and Friday Home Health Nurse may visit PRN to address patient s wound care needs. FACE TO FACE ENCOUNTER: MEDICARE and MEDICAID PATIENTS: I certify that this patient is under my care and that I had a face-to-face encounter that meets the physician face-to-face encounter requirements with this patient on this date. The encounter with the patient was in whole or in part for the following MEDICAL CONDITION: (primary reason for [place]) MEDICAL NECESSITY: I certify, that based on my findings, NURSING services are a medically necessary home health service. HOME BOUND STATUS: I certify that my clinical findings support that this patient is homebound (i.e., Due to illness or injury, pt requires aid of supportive devices such as crutches, cane, wheelchairs, walkers, the use of special transportation or the assistance of another person to leave their place of residence. There is a normal inability to leave the home and doing so requires considerable and taxing effort. Other absences are for medical reasons / religious services and are infrequent or of short duration when for other reasons). If current dressing causes regression in wound condition, may D/C ordered dressing product/s and apply Normal Saline Capizzi, [name] ([id number]) Moist Dressing daily until next [place] / Other MD appointment. [place] of regression in wound condition at [phone]. Please direct any NON-WOUND related issues/requests for orders to patient's Primary Care Physician Wound #4 Right,Distal Toe Second: [place] [facility]its - [facility]. Please visit patient Monday, Wednesday, and Friday Home Health Nurse may visit PRN to address patient s  wound care needs. FACE TO FACE ENCOUNTER: MEDICARE and MEDICAID PATIENTS: I certify that this patient is under my care and that I had a face-to-face encounter that meets the physician face-to-face encounter requirements with this patient on this date. The encounter with the patient was in whole or in part for the following MEDICAL CONDITION: (primary reason for [place]) MEDICAL NECESSITY: I certify, that based on my findings, NURSING services are a medically necessary home health service. HOME BOUND STATUS: I certify that my clinical findings support that this patient is homebound (i.e., Due to illness or injury, pt requires aid of supportive devices such as crutches, cane, wheelchairs, walkers, the use of special transportation or the assistance of another person to leave their place of residence. There is a normal inability to leave the home and doing so requires considerable and taxing effort. Other absences are for medical reasons / religious services and are infrequent or of short duration when for other reasons). If current dressing causes regression in wound condition, may D/C ordered dressing product/s and apply Normal Saline Moist Dressing daily until next [place] / Other MD appointment. [place] of regression in wound condition at [phone]. Please direct any NON-WOUND related issues/requests for orders to patient's Primary Care Physician Wound #5 Right,Distal Toe Great: Continue  Home Health Visits - [facility]. Please visit patient Monday, Wednesday, and Friday Home Health Nurse may visit PRN to address patient s wound care needs. FACE TO FACE ENCOUNTER: MEDICARE and MEDICAID PATIENTS: I certify that this patient is under my care and that I had a face-to-face encounter that meets the physician face-to-face encounter requirements with this patient on this date. The encounter with the patient was in whole or in part for the following MEDICAL  CONDITION: (primary reason for [place]) MEDICAL NECESSITY: I certify, that based on my findings, NURSING services are a medically necessary home health service. HOME BOUND STATUS: I certify that my clinical findings support that this patient is homebound (i.e., Due to illness or injury, pt requires aid of supportive devices such as crutches, cane, wheelchairs, walkers, the use of special transportation or the assistance of another person to leave their place of residence. There is a normal inability to leave the home and doing so requires considerable and taxing effort. Other absences are for medical reasons / religious services and are infrequent or of short duration when for other reasons). If current dressing causes regression in wound condition, may D/C ordered dressing product/s and apply Normal Saline Moist Dressing daily until next [place] / Other MD appointment. [place] of regression in wound condition at [phone]. Please direct any NON-WOUND related issues/requests for orders to patient's Primary Care Physician My suggestion at this point is gonna be that we initiate the above wound care measures for the next week. Patient is in agreement with plan. We will subsequently see were things stand at follow-up. If anything changes or worsens meantime she will let me know otherwise my hope is that the toes are progressing towards complete healing and in regard to the right malleolus ulcer we're gonna try to pack this with the Iodoform galls although I think I may want to check into a skin substitute to see if there's anything that would be beneficial in this regard. The patient is in agreement the plan. I'll see her back in one week for reevaluation. Please see above for specific wound care orders. We will see patient for re-evaluation in 1 week(s) here in the clinic. If anything worsens or changes patient will contact our office for additional  recommendations. Electronic Signature(s) Signed: 01/12/2019 3:37:27 PM By: Worthy Keeler PA-C Entered By: Worthy Keeler on 01/10/2019 12:35:50 Rho, [name] ([id number]) -------------------------------------------------------------------------------- ROS/PFSH Details Patient Name: [name] Date of Service: 01/10/2019 10:30 AM Medical Record Number: [id number] Patient Account Number: [id number] Date of Birth/Sex: [birth year] (85 y.o. F) Treating RN: Montey Hora Primary Care Provider: Grayland Ormond Other Clinician: Referring Provider: Grayland Ormond Treating Provider/Extender: Melburn Hake, Winfrey Chillemi Weeks in Treatment: 52 Information Obtained From Patient Wound History Do you currently have one or more open woundso Yes How many open wounds do you currently haveo 1 Approximately how long have you had your woundso 2 yrs How have you been treating your wound(s) until nowo mupirocin, soaking in epsom salt Has your wound(s) ever healed and then re-openedo No Have you had any lab work done in the past montho No Have you tested positive for an antibiotic resistant organism (MRSA, VRE)o No Have you tested positive for osteomyelitis (bone infection)o No Have you had any tests for circulation on your legso Yes Who ordered the testo Dr. Lucky Cowboy [facility] Constitutional Symptoms (General Health) Complaints and Symptoms: Negative for: Fever; Chills Eyes Medical History: Positive for: Cataracts -  surgery Respiratory Complaints and Symptoms: No Complaints or Symptoms Cardiovascular Complaints and Symptoms: No Complaints or Symptoms Medical History: Positive for: Congestive Heart Failure; Hypertension Musculoskeletal Medical History: Positive for: Osteoarthritis Neurologic Medical History: Positive for: Neuropathy JULEEN, SORRELS ([id number]) Oncologic Medical History: Negative for: Received Chemotherapy; Received Radiation Psychiatric Complaints  and Symptoms: No Complaints or Symptoms HBO Extended History Items Eyes: Cataracts Immunizations Pneumococcal Vaccine: Received Pneumococcal Vaccination: Yes Implantable Devices Family and Social History Cancer: Yes - Father,Siblings; Diabetes: No; Heart Disease: Yes - Siblings; Hereditary Spherocytosis: No; Hypertension: No; Kidney Disease: No; Lung Disease: No; Seizures: No; Stroke: No; Thyroid Problems: No; Tuberculosis: No; Never smoker; Marital Status - Married; Alcohol Use: Never; Drug Use: No History; Caffeine Use: Rarely; Financial Concerns: No; Food, Clothing or Shelter Needs: No; Support System Lacking: No; Transportation Concerns: No; Advanced Directives: No; Patient does not want information on Advanced Directives; Do not resuscitate: No; Living Will: Yes (Not Provided); Medical Power of Attorney: No Physician Affirmation I have reviewed and agree with the above information. Electronic Signature(s) Signed: 01/10/2019 5:34:10 PM By: Montey Hora Signed: 01/12/2019 3:37:27 PM By: Worthy Keeler PA-C Entered By: Worthy Keeler on 01/10/2019 12:34:40 Keelan, [name] ([id number]) -------------------------------------------------------------------------------- SuperBill Details Patient Name: [name] Date of Service: 01/10/2019 Medical Record Number: [id number] Patient Account Number: [id number] Date of Birth/Sex: 13-Jun-1923 (85 y.o. 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 I70.243 Atherosclerosis of native  arteries of left leg with ulceration of ankle I89.0 Lymphedema, not elsewhere classified B35.4 Tinea corporis 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, left ankle and foot Facility Procedures CPT4 Code: 62376283 Description: 15176 - DEB SUBQ TISSUE 20 SQ CM/< ICD-10 Diagnosis Description L89.892 Pressure ulcer of other site, stage 2 Modifier: Quantity: 1 CPT4 Code: 16073710 Description: 62694 - PARE BENIGN LES; SGL ICD-10 Diagnosis Description L89.892 Pressure ulcer of other site, stage 2 Modifier: Quantity: 1 Physician Procedures CPT4 Code: 8546270 Description: 35009 - WC PHYS SUBQ TISS 20 SQ CM ICD-10 Diagnosis Description L89.892 Pressure ulcer of other site, stage 2 Modifier: Quantity: 1 CPT4 Code: 3818299 Description: 37169 - WC PHYS PARE BENIGN LES; SGL ICD-10 Diagnosis Description L89.892 Pressure ulcer of other site, stage 2 Modifier: Quantity: 1 Electronic Signature(s) Signed: 01/12/2019 3:37:27 PM By: Worthy Keeler PA-C Entered By: Worthy Keeler on 01/10/2019 67:89:38

## 2019-01-17 ENCOUNTER — Encounter: Payer: Medicare Other | Admitting: Physician Assistant

## 2019-01-17 DIAGNOSIS — I70243 Atherosclerosis of native arteries of left leg with ulceration of ankle: Secondary | ICD-10-CM | POA: Diagnosis not present

## 2019-01-19 NOTE — Progress Notes (Signed)
XOEY, WARMOTH ([id number]) Visit Report for 01/17/2019 Chief Complaint Document Details Patient Name: ICEIS, KNAB. Date of Service: 01/17/2019 10:30 AM Medical Record Number: [id number] Patient Account Number: [id number] Date of Birth/Sex: 06-04-1923 (85 y.o. F) Treating RN: Montey Hora Primary Care Provider: Grayland Ormond Other Clinician: Referring Provider: Grayland Ormond Treating Provider/Extender: Melburn Hake, Brytani Voth Weeks in Treatment: 9 Information Obtained from: Patient Chief Complaint Patient is here for right lateral malleolus, right 2nd distal toe and left lateral malleolus ulcer Electronic Signature(s) Signed: 01/19/2019 7:15:48 AM By: Worthy Keeler PA-C Entered By: Worthy Keeler on 01/17/2019 11:11:24 Peifer, [name] ([id number]) -------------------------------------------------------------------------------- Debridement Details Patient Name: [name] Date of Service: 01/17/2019 10:30 AM Medical Record Number: [id number] Patient Account Number: [id number] Date of Birth/Sex: [birth year] (85 y.o. F) Treating RN: Montey Hora Primary Care Provider: Grayland Ormond Other Clinician: Referring Provider: Grayland Ormond Treating Provider/Extender: Melburn Hake, Jayden Kratochvil Weeks in Treatment: 75 Debridement Performed for Wound #5 Right,Distal Toe Great Assessment: Performed By: Physician STONE III, Nakeem Murnane E., PA-C Debridement Type: Debridement Level of Consciousness (Pre- Awake and Alert procedure): Pre-procedure Verification/Time Yes - 11:21 Out Taken: Start Time: 11:21 Pain Control: Lidocaine 4% Topical Solution Total Area Debrided (L x W): 0.3 (cm) x 0.4 (cm) = 0.12 (cm) Tissue and other material Viable, Non-Viable, Callus, Slough, Subcutaneous, Slough debrided: Level: Skin/Subcutaneous Tissue Debridement Description: Excisional Instrument: Curette Bleeding: Minimum Hemostasis Achieved: Pressure End Time: 11:23 Procedural Pain: 0 Post  Procedural Pain: 0 Response to Treatment: Procedure was tolerated well Level of Consciousness Awake and Alert (Post-procedure): Post Debridement Measurements of Total Wound Length: (cm) 0.3 Stage: Category/Stage II Width: (cm) 0.4 Depth: (cm) 0.2 Volume: (cm) 0.019 Character of Wound/Ulcer Post Improved Debridement: Post Procedure Diagnosis Same as Pre-procedure Electronic Signature(s) Signed: 01/17/2019 4:52:28 PM By: Montey Hora Signed: 01/19/2019 7:15:48 AM By: Worthy Keeler PA-C Entered By: Montey Hora on 01/17/2019 11:24:13 Ebanks, [name] ([id number]) -------------------------------------------------------------------------------- HPI Details Patient Name: [name] Date of Service: 01/17/2019 10:30 AM Medical Record Number: [id number] Patient Account Number: [id number] Date of Birth/Sex: September 01, 1923 (85 y.o. 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 Dr. Amalia Hailey the podiatrist saw her on 07/20/2017 and sent her to the wound center for possible hyperbaric oxygen therapy. past medical history of peripheral vascular disease, varicose veins, status post appendectomy, basal cell carcinoma excision from the left leg, cholecystectomy, pacemaker placement, right lower extremity angiography done by Dr. dew in March 2017 with placement of a stent. there is also note of a successful ablation of the right small saphenous vein done which was reviewed by ultrasound on 10/24/2016. the patient had a right small saphenous vein ablation done on 10/20/2016. The patient has never been a  smoker. She has been seen by Dr. Corene Cornea dew the vascular surgeon who most recently saw her on 06/15/2017 for evaluation of ongoing problems with right leg swelling. She had a lower extremity arterial duplex examination done(02/13/17) which showed patent distal right superficial femoral artery stent and above-the-knee popliteal stent without evidence of restenosis. The ABI was more than 1.3 on the right and more than 1.3 on the left. This was consistent with noncompressible arteries due to medial calcification. The right  great toe pressure and PPG waveforms are within normal limits and the left great toe pressure and PPG waveforms are decreased. he recommended she continue to wear her compression stockings and continue with elevation. She is scheduled to have a noninvasive arterial study in the near future 08/16/2017 -- had a lower extremity arterial duplex examination done which showed patent distal right superficial femoral artery stent and above-the-knee popliteal stent without evidence of restenosis. The ABI was more than 1.3 on the right and more than 1.3 on the left. This was consistent with noncompressible arteries due to medial calcification. The right great toe pressure and PPG waveforms are within normal limits and the left great toe pressure and PPG waveforms are decreased. the x-ray of the right ankle has not yet been done 08/24/2017 -- had a right ankle x-ray -- IMPRESSION:1. No fracture, bone lesion or evidence of osteomyelitis. 2. Lateral soft tissue swelling with a soft tissue ulcer. she has not yet seen the vascular surgeon for review 08/31/17 on evaluation today patient's wound appears to be showing signs of improvement. She still with her appointment with vascular in order to review her results of her vascular study and then determine if any intervention would be recommended at that time. No fevers, chills, nausea, or vomiting noted at this time. She has been tolerating the dressing  changes without complication. 09/28/17 on evaluation today patient's wound appears to show signs of good improvement in regard to the granulation tissue which is surfacing. There is still a layer of slough covering the wound and the posterior portion is still significantly deeper than the anterior nonetheless there has been some good sign of things moving towards the better. She is going to go back to Dr. dew for reevaluation to ensure her blood flow is still appropriate. That will be before her next evaluation with [place] next week. No fevers, chills, nausea, or vomiting noted at this time. Patient does have some discomfort rated to be a 3-4/10 depending on activity specifically cleansing the wound makes it worse. 10/05/2017 -- the patient was seen by Dr. Lucky Cowboy last week and noninvasive studies showed a normal right ABI with brisk triphasic waveforms consistent with no arterial insufficiency including normal digital pressures. The duplex showed a patent distal right SFA stent and the proximal SFA was also normal. He was pleased with her test and thought she should have enough of perfusion for normal wound healing. He would see her back in 6 months time. 12/21/17 on evaluation today patient appears to be doing fairly well in regard to her right lateral ankle wound. Unfortunately the main issue that she is expansion at this point is that she is having some issues with what appears to be some cellulitis in the ZULA, HOVSEPIAN. ([id number]) right anterior shin. She has also been noting a little bit of uncomfortable feeling especially last night and her ankle area. I'm afraid that she made the developing a little bit of an infection. With that being said I think it is in the early stages. 12/28/17 on evaluation today patient's ankle appears to be doing excellent. She's making good progress at this point the cellulitis seems to have improved after last week's evaluation. Overall she is having no significant  discomfort which is excellent news. She does have an appointment with Dr. dew on March 29, 2018 for reevaluation in regard to the stent he placed. She seems to have excellent blood flow in the right lower extremity. 01/19/12 on evaluation today patient's wound appears  to be doing very well. In fact she does not appear to require debridement at this point, there's no evidence of infection, and overall from the standpoint of the wound she seems to be doing very well. With that being said I believe that it may be time to switch to different dressing away from the [facility] she tells me she does have a lot going on her friend actually passed away yesterday and she's also having a lot of issues with her husband this obviously is weighing heavy on her as far as your thoughts and concerns today. 01/25/18 on evaluation today patient appears to be doing fairly well in regard to her right lateral malleolus. She has been tolerating the dressing changes without complication. Overall I feel like this is definitely showing signs of improvement as far as how the overall appearance of the wound is there's also evidence of epithelium start to migrate over the granulation tissue. In general I think that she is progressing nicely as far as the wound is concerned. The only concern she really has is whether or not we can switch to every other week visits in order to avoid having as many appointments as her daughters have a difficult time getting her to her appointments as well as the patient's husband to his he is not doing very well at this point. 02/22/18 on evaluation today patient's right lateral malleolus ulcer appears to be doing great. She has been tolerating the dressing changes without complication. Overall you making excellent progress at this time. Patient is having no significant discomfort. 03/15/18 on evaluation today patient appears to be doing much more poorly in regard to her right lateral  ankle ulcer at this point. Unfortunately since have last seen her her husband has passed just a few days ago is obviously weighed heavily on her her daughter also had surgery well she is with her today as usual. There does not appear to be any evidence of infection she does seem to have significant contusion/deep tissue injury to the right lateral malleolus which was not noted previous when I saw her last. It's hard to tell of exactly when this injury occurred although during the time she was spending the night in the hospital this may have been most likely. 03/22/18 on evaluation today patient appears to actually be doing very well in regard to her ulcer. She did unfortunately have a setback which was noted last week however the good news is we seem to be getting back on track and in fact the wound in the core did still have some necrotic tissue which will be addressed at this point today but in general I'm seeing signs that things are on the up and up. She is glad to hear this obviously she's been somewhat concerned that due to the how her wound digressed more recently. 03/29/18 on evaluation today patient appears to be doing fairly well in regard to her right lower extremity lateral malleolus ulcer. She unfortunately does have a new area of pressure injury over the inferior portion where the wound has opened up a little bit larger secondary to the pressure she seems to be getting. She does tell me sometimes when she sleeps at night that it actually hurts and does seem to be pushing on the area little bit more unfortunately. There does not appear to be any evidence of infection which is good news. She has been tolerating the dressing changes without complication. She also did have some bruising in the  left second and third toes due to the fact that she may have bump this or injured it although she has neuropathy so she does not feel she did move recently that may have been where this came from.  Nonetheless there does not appear to be any evidence of infection at this time. 04/12/18 on evaluation today patient's wound on the right lateral ankle actually appears to be doing a little bit better with a lot of necrotic docking tissue centrally loosening up in clearing away. However she does have the beginnings of a deep tissue injury on the left lateral malleolus likely due to the fact we've been trying offload the right as much as we have. I think she may benefit from an assistive soft device to help with offloading and it looks like they're looking at one of the doughnut conditions that wraps around the lower leg to offload which I think will definitely do a good job. With that being said I think we definitely need to address this issue on the left before it becomes a wound. Patient is not having significant pain. 04/19/18 on evaluation today patient appears to be doing excellent in regard to the progress she's made with her right lateral ankle ulcer. The left ankle region which did show evidence of a deep tissue injury seems to be resolving there's little fluid noted underneath and a blister there's nothing open at this point in time overall I feel like this is progressing nicely which is good news. She does not seem to be having significant discomfort at this point which is also good news. 04/25/18-She is here in follow up evaluation for bilateral lateral malleolar ulcers. The right lateral malleolus ulcer with pale subcutaneous tissue exposure, central area of ulcer with tendon/periosteum exposed. The left lateral malleolus ulcer now with Zellars, [name] ([id number]) central area of nonviable tissue, otherwise deep tissue injury. She is wearing compression wraps to the left lower extremity, she will place the right lower extremity compression wraps on when she gets home. She will be out of town over the weekend and return next week and follow-up appointment. She completed her doxycycline  this morning 05/03/18 on evaluation today patient appears to be doing very well in regard to her right lateral ankle ulcer in general. At least she's showing some signs of improvement in this regard. Unfortunately she has some additional injury to the left lateral malleolus region which appears to be new likely even over the past several days. Again this determination is based on the overall appearance. With that being said the patient is obviously frustrated about this currently. 05/10/18-She is here in follow-up evaluation for bilateral lateral malleolar ulcers. She states she has purchased offloading shoes/boots and they will arrive tomorrow. She was asked to bring them in the office at next week's appointment so her provider is aware of product being utilized. She continues to sleep on right or left side, she has been encouraged to sleep on her back. The right lateral malleolus ulcer is precariously close to peri-osteum; will order xray. The left lateral malleolus ulcer is improved. Will switch back to santyl; she will follow up next week. 05/17/18 on evaluation today patient actually appears to be doing very well in regard to her malleolus her ulcers compared to last time I saw them. She does not seem to have as much in the way of contusion at this point which is great news. With that being said she does continue to have discomfort and I do  believe that she is still continuing to benefit from the offloading/pressure reducing boots that were recommended. I think this is the key to trying to get this to heal up completely. 05/24/18 on evaluation today patient actually appears to be doing worse at this point in time unfortunately compared to her last week's evaluation. She is having really no increased pain which is good news unfortunately she does have more maceration in your theme and noted surrounding the right lateral ankle the left lateral ankle is not really is erythematous I do not see signs of  the overt cellulitis on that side. Unfortunately the wounds do not seem to have shown any signs of improvement since the last evaluation. She also has significant swelling especially on the right compared to previous some of this may be due to infection however also think that she may be served better while she has these wounds by compression wrapping versus continuing to use the Juxta-Lite for the time being. Especially with the amount of drainage that she is experiencing at this point. No fevers, chills, nausea, or vomiting noted at this time. 05/31/18 on evaluation today patient appears to actually be doing better in regard to her right lateral lower extremity ulcer specifically on the malleolus region. She has been tolerating the antibiotic without complication. With that being said she still continues to have issues but a little bit of redness although nothing like she what she was experiencing previous. She still continues to pressure to her ankle area she did get the problem on offloading boots unfortunately she will not wear them she states there too uncomfortable and she can't get in and out of the bed. Nonetheless at this point her wounds seem to be continually getting worse which is not what we want I'm getting somewhat concerned about her progress and how things are going to proceed if we do not intervene in some way shape or form. I therefore had a very lengthy conversation today about offloading yet again and even made a specific suggestion for switching her to a memory foam mattress and even gave the information for a specific one that they could look at getting if it was something that they were interested in considering. She does not want to be considered for a hospital bed air mattress although honestly insurance would not cover it that she does not have any wounds on her trunk. 06/14/18 on evaluation today both wounds over the bilateral lateral malleolus her ulcers appear to be doing  better there's no evidence of pressure injury at this point. She did get the foam mattress for her bed and this does seem to have been extremely beneficial for her in my pinion. Her daughter states that she is having difficulty getting out of bed because of how soft it is. The patient also relates this to be. Nonetheless I do feel like she's actually doing better. Unfortunately right after and around the time she was getting the mattress she also sustained a fall when she got up to go pick up the phone and ended up injuring her right elbow she has 18 sutures in place. We are not caring for this currently although home health is going to be taking the sutures out shortly. Nonetheless this may be something that we need to evaluate going forward. It depends on how well it has or has not healed in the end. She also recently saw an orthopedic specialist for an injection in the right shoulder just before her fall unfortunately the  fall seems to have worsened her pain. 06/21/18 on evaluation today patient appears to be doing about the same in regard to her lateral malleolus ulcers. Both appear to be just a little bit deeper but again we are clinging away the necrotic and dead tissue which I think is why this is progressing towards a deeper realm as opposed to improving from my measurement standpoint in that regard. Nonetheless she has been tolerating the dressing changes she absolutely hates the memory foam mattress topper that was obtained for her nonetheless I do believe this is still doing excellent as far as taking care of excess pressure in regard to the lateral malleolus regions. She in fact has no pressure injury that I see whereas in weeks past it was week by week I was constantly seeing new pressure injuries. Overall I think it has been very beneficial for her. 07/03/18; patient arrives in my clinic today. She has deep punched out areas over her bilateral lateral malleoli. The area on the right has  some more depth. IYANAH, DEMONT ([id number]) We spent a lot of time today talking about pressure relief for these areas. This started when her daughter asked for a prescription for a memory foam mattress. I have never written a prescription for a mattress and I don't think insurances would pay for that on an ordinary bed. In any case he came up that she has foam boots that she refuses to wear. I would suggest going to these before any other offloading issues when she is in bed. They say she is meticulous about offloading this the rest of the day 07/10/18- She is seen in follow-up evaluation for bilateral, lateral malleolus ulcers. There is no improvement in the ulcers. She has purchased and is sleeping on a memory foam mattress/overlay, she has been using the offloading boots nightly over the past week. She has a follow up appointment with vascular medicine at the end of October, in my opinion this follow up should be expedited given her deterioration and suboptimal TBI results. We will order plain film xray of the left ankle as deeper structures are palpable; would consider having MRI, regardless of xray report(s). The ulcers will be treated with iodoflex/iodosorb, she is unable to safely change the dressings daily with santyl. 07/19/18 on evaluation today patient appears to be doing in general visually well in regard to her bilateral lateral malleolus ulcers. She has been tolerating the dressing changes without complication which is good news. With that being said we did have an x-ray performed on 07/12/18 which revealed a slight loosen see in the lateral portion of the distal left fibula which may represent artifact but underline lytic destruction or osteomyelitis could not be excluded. MRI was recommended. With that being said we can see about getting the patient scheduled for an MRI to further evaluate this area. In fact we have that scheduled currently for August 20 19,019. 07/26/18 on  evaluation today patient's wound on the right lateral ankle actually appears to be doing fairly well at this point in my pinion. She has made some good progress currently. With that being said unfortunately in regard to the left lateral ankle ulcer this seems to be a little bit more problematic at this time. In fact as I further evaluated the situation she actually had bone exposed which is the first time that's been the case in the bone appear to be necrotic. Currently I did review patient's note from Dr. Bunnie Domino office with [facility]. He stated that ABI was 1.26 on the right and 0.95 on the left with good waveforms. Her perfusion is stable not reduced from previous studies and her digital waveforms were pretty good particularly on the right. His conclusion upon review of the note was that there was not much she could do to improve her perfusion and he felt she was adequate for wound healing. His suggestion was that she continued to see [place] and consider a synthetic skin graft if there was no underlying infection. He plans to see her back in six months or as needed. 08/01/18 on evaluation today patient appears to be doing better in regard to her right lateral ankle ulcer. Her left lateral ankle ulcer is about the same she still has bone involvement in evidence of necrosis. There does not appear to be evidence of infection at this time On the right lateral lower extremity. I have started her on the Augmentin she picked this up and started this yesterday. This is to get her through until she sees infectious disease which is scheduled for 08/12/18. 08/06/18 on evaluation today patient appears to be doing rather well considering my discussion with patient's daughter at the end of last week. The area which was marked where she had erythema seems to be improved and this is good news. With that being said overall the patient seems to be making good improvement when it comes to the overall  appearance of the right lateral ankle ulcer although this has been slow she at least is coming around in this regard. Unfortunately in regard to the left lateral ankle ulcer this is osteomyelitis based on the pathology report as well is bone culture. Nonetheless we are still waiting CT scan. Unfortunately the MRI we originally ordered cannot be performed as the patient is a pacemaker which I had overlooked. Nonetheless we are working on the CT scan approval and scheduling as of now. She did go to the hospital over the weekend and was placed on IV Cefzo for a couple of days. Fortunately this seems to have improved the erythema quite significantly which is good news. There does not appear to be any evidence of worsening infection at this time. She did have some bleeding after the last debridement therefore I did not perform any sharp debridement in regard to left lateral ankle at this point. Patient has been approved for a snap vac for the right lateral ankle. 08/14/18; the patient with wounds over her bilateral lateral malleoli. The area on the right actually looks quite good. Been using a snap back on this area. Healthy granulation and appears to be filling in. Unfortunately the area on the left is really problematic. 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 Also noted to have cellulitis. She saw Dr. Novella Olive of infectious disease today and was put on linezolid. We are able to verify this with her pharmacy. She is completed the Augmentin that she was already on. We've been using Iodoflex to this area 08/23/18 on evaluation today patient's wounds both actually appear to be doing better compared to my prior evaluations. Fortunately she showing signs of good improvement in regard to the overall wound status especially where were using the snap vac on the right. In regard to left lateral malleolus the wound bed actually  appears to be much cleaner than previously noted. I do not feel any phone directly probed during evaluation today and though there  is tendon noted this does not appear Milson, [name] ([id number]) to be necrotic it's actually fairly good as far as the overall appearance of the tendon is concerned. In general the wound bed actually appears to be doing significantly better than it was previous. Patient is currently in the care of [name] and I did review that note today. He actually has her on two weeks of linezolid and then following the patient will be on 1-2 months of Keflex. That is the plan currently. She has been on antibiotics therapy as prescribed by myself initially starting on July 30, 2018 and has been on that continuously up to this point. 08/30/18 on evaluation today patient actually appears to be doing much better in regard to her right lateral malleolus ulcer. She has been tolerating the dressing changes specifically the snap vac without complication although she did have some issues with the seal currently. Apparently there was some trouble with getting it to maintain over the past week past Sunday. Nonetheless overall the wound appears better in regard to the right lateral malleolus region. In regard to left lateral malleolus this actually show some signs of additional granulation although there still tendon noted in the base of the wound this appears to be healthy not necrotic in any way whatsoever. We are considering potentially using a snap vac for the left lateral malleolus as well the product wrap from [employer], [place], was present in the clinic today we're going to see this patient I did have her come in with me after obtaining consent from the patient and her daughter in order to look at the wound and see if there's any recommendation one way or another as to whether or not they felt the snapback could be beneficial for the left lateral malleolus region. But the conclusion  was that it might be but that this is definitely a little bit deeper wound than what traditionally would be utilized for a snap vac. 09/06/18 on evaluation today patient actually appears to be doing excellent in my pinion in regard to both ankle ulcers. She has been tolerating the dressing changes without complication which is great news. Specifically we have been using the snap vac. In regard to the right ankle I'm not even sure that this is going to be necessary for today and following as the wound has filled in quite nicely. In regard to the left ankle I do believe that we're seeing excellent epithelialization from the edge as well as granulation in the central portion the tendon is still exposed but there's no evidence of necrotic bone and in general I feel like the patient has made excellent progress even compared to last week with just one week of the snap vac. 09/11/18; this is a patient who has wounds on her bilateral lateral malleoli. Initially both of these were deep stage IV wounds in the setting of chronic arterial insufficiency. She has been revascularized. As I understand think she been using snap vacs to both of these wounds however the area on the right became more superficial and currently she is only using it on the left. Using silver collagen on the right and silver collagen under the back on the left I believe 09/19/18 on evaluation today patient actually appears to be doing very well in regard to her lateral malleolus or ulcers bilaterally. She has been tolerating the dressing changes without complication. Fortunately there does not appear to be any evidence of infection at this time. Overall I feel like she  is improving in an excellent manner and I'm very pleased with the fact that everything seems to be turning towards the better for her. This has obviously been a long road. 09/27/18 on evaluation today patient actually appears to be doing very well in regard to her bilateral  lateral malleolus ulcers. She has been tolerating the dressing changes without complication. Fortunately there does not appear to be any evidence of infection at this time which is also great news. No fevers, chills, nausea, or vomiting noted at this time. Overall I feel like she is doing excellent with the snap vac on the left malleolus. She had 40 mL of fluid collection over the past week. 10/04/18 on evaluation today patient actually appears to be doing well in regard to her bilateral lateral malleolus ulcers. She continues to tolerate the dressing changes without complication. One issue that I see is the snap vac on the left lateral malleolus which appears to have sealed off some fluid underlying this area and has not really allowed it to heal to the degree that I would like to see. For that reason I did suggest at this point we may want to pack a small piece of packing strip into this region to allow it to more effectively wick out fluid. 10/11/18 in general the patient today does not feel that she has been doing very well. She's been a little bit lethargic and subsequently is having bodyaches as well according to what she tells me today. With that being said overall she has been concerned with the fact that something may be worsening although to be honest her wounds really have not been appearing poorly. She does have a new ulcer on her left heel unfortunately. This may be pressure related. Nonetheless it seems to me to have potentially started at least as a blister I do not see any evidence of deep tissue injury. In regard to the left ankle the snap vac still seems to be causing the ceiling off of the deeper part of the wound which is in turn trapping fluid. I'm not extremely pleased with the overall appearance as far as progress from last week to this week therefore I'm gonna discontinue the snap vac at this point. 10/18/18 patient unfortunately this point has not been feeling well for the  past several days. She was seen by Grayland Ormond her primary care provider who is a Librarian, academic at [employer]. Subsequently she states that she's been very weak and generally feeling malaise. No fevers, chills, nausea, or vomiting noted at this time. With that being said bloodwork was performed at the PCP office on the 11th of this month which showed a white blood cell count of 10.7. This was repeated today Cousar, [name] ([id number]) and shows a white blood cell count of 12.4. This does show signs of worsening. Coupled with the fact that she is feeling worse and that her left ankle wound is not really showing signs of improvement I feel like this is an indication that the osteomyelitis is likely exacerbating not improving. Overall I think we may also want to check her C-reactive protein and sedimentation rate. Actually did call [facility] office this afternoon while the patient was in the office here with me. Subsequently based on the findings we discussed treatment possibilities and I think that it is appropriate for [place] to go ahead and initiate treatment with doxycycline which I'm going to do. Subsequently he did agree to see about adding a CRP and sedimentation  rate to her orders. If that has not already been drawn to where they can run it they will contact the patient she can come back to have that check. They are in agreement with plan as far as the patient and her daughter are concerned. Nonetheless also think we need to get in touch with Dr. Henreitta Leber office to see about getting the patient scheduled with him as soon as possible. 11/08/18 on evaluation today patient presents for follow-up concerning her bilateral foot and ankle ulcers. I did do an extensive review of her chart in epic today. Subsequently she was seen by [name] he did initiate Cefepime IV antibiotic therapy. Subsequently she had some issues with her PICC line this had to be removed because it was coiled  and then replaced. Fortunately that was now settled. Unfortunately she has continued have issues with her left heel as well as the issues that she is experiencing with her bilateral lateral malleolus regions. I do believe however both areas seem to be doing a little bit better on evaluation today which is good news. No fevers, chills, nausea, or vomiting noted at this time. She actually has an angiogram schedule with Dr. dew on this coming Monday, November 11, 2018. Subsequently the patient states that she is feeling much better especially than what she was roughly 2 weeks ago. She actually had to cancel an appointment because she was feeling so poorly. No fevers, chills, nausea, or vomiting noted at this time. 11/15/18 on evaluation today patient actually is status post having had her angiogram with Dr. dew Monday, four days ago. 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 He had to go and work on several areas of the vasculature fortunately he was able to obtain no more than a 30% residual stenosis throughout post procedure. I reviewed this note today. I think this will definitely help with healing at this time. Fortunately there does not appear to be any signs of infection and I do feel like ratio already has a better appearance to it. 11/22/18 upon evaluation today patient actually appears to be doing very well in regard to her wounds in general. The right lateral malleolus looks excellent the heel looks better in the left lateral malleolus also appears to be doing a little better. With that being said the right second toe actually appears to be open and training we been watching this is been dry and stable but now is open. 12/03/2018 Seen today for follow-up and management of multiple bilateral lower extremity wounds. New pressure injury of the great toe which is closed at this time. Wound of the right distal second toe appears larger today with deep undermining and  a pocket of fluid present within the undermining region. Left and right malleolus is wounds are stable today with no signs and symptoms of infection.Denies any needs or concerns during exam today. 12/13/18 on evaluation today patient appears to be doing somewhat better in regard to her left heel ulcer. She also seems to be completely healed in regard to the right lateral malleolus ulcer. The left malleolus ulcer is smaller what unfortunately the wounds which are new over the first and second toes of the right foot are what are most concerning at this point especially the second. Both areas did require sharp debridement today. 12/20/18 on evaluation today patient's wound actually appears to be doing better in regard to left lateral ankle and her right lateral ankle continues to remain healed.  The hill ulcer on the left is improved. She does have improvement noted as well in regard to both toe ulcers. Overall I'm very pleased in this regard. No fevers, chills, nausea, or vomiting noted at this time. 12/23/18 on evaluation today patient is seen after she had her toenails trimmed at the podiatrist office due to issues with her right great toe. There was what appeared to be dark eschar on the surface of the wound which had her in the podiatrist concerned. Nonetheless as I remember that during the last office visit I had utilize silver nitrate of this area I was much less concerned about the situation. Subsequently I was able to clean off much of this tissue without any complication today. This does not appear to show any signs of infection and actually look somewhat better compared to last time post debridement. Her second toe on the right foot actually had callous over and there did appear still be some fluid underneath this that would require debridement today. 12/27/18 on evaluation today patient actually appears to be showing signs of improvement at all locations. Even the left lateral ankle although  this is not quite as great as the other sites. Fortunately there does not appear to be any signs of infection at this time and both of her toes on the right foot seem to be showing signs of improvement which is good news and very pleased in this regard. 01/03/19 on evaluation today patient appears to be doing better for the most part in regard to her wounds in particular. There Gago, [name] ([id number]) does not appear to be any evidence of infection at this time which is good news. Fortunately there is no sign of really worsening anywhere except for the right great toe which she does have what appears to be a bruise/deep tissue injury which is very superficial and already resolving. I'm not sure where this came from I questioned her extensively and she does not recall what may have happened with this. Other than that the patient seems to be doing well even the left lateral ankle ulcer looks good and is getting smaller. 01/10/19 on evaluation today patient appears to be doing well in regard to her left heel wound and both of her toe wounds. Overall I feel like there is definitely improvement here and I'm happy in that regard. With that being said unfortunately she is having issues with the left lateral malleolus ulcer which unfortunately still has a lot of depth to it. This is gonna be a very difficult wound for [place] to be able to truly get to heal. I may want to consider some type of skin substitute to see if this would be of benefit for her. I'll discuss this with her more the next visit most likely. This was something I thought about more at the end of the visit when I was Artie out of the room and the patient had been discharged. 01/17/19 on evaluation today patient appears to be doing very well in regard to her wounds in general. She's been making excellent progress at this time. Fortunately there's no sign of infection at this time either. No fevers, chills, nausea, or vomiting noted at this  time. The biggest issue is still her left lateral malleolus where it appears to be doing well and is getting smaller but still shows a small corner where this is deeper and goes down into what appears to be the joint space. Nonetheless this is taking much longer to heal  although it still looks better in smaller than previous evaluations. Electronic Signature(s) Signed: 01/19/2019 7:15:48 AM By: Worthy Keeler PA-C Entered By: Worthy Keeler on 01/17/2019 14:02:29 [name] ([id number]) -------------------------------------------------------------------------------- Physical Exam Details Patient Name: [name] Date of Service: 01/17/2019 10:30 AM Medical Record Number: [id number] Patient Account Number: [id number] Date of Birth/Sex: Jun 18, 1923 (85 y.o. F) Treating RN: Montey Hora Primary Care Provider: Grayland Ormond Other Clinician: Referring Provider: Grayland Ormond Treating Provider/Extender: STONE III, Toby Breithaupt Weeks in Treatment: 63 Constitutional Well-nourished and well-hydrated in no acute distress. Respiratory normal breathing without difficulty. Psychiatric this patient is able to make decisions and demonstrates good insight into disease process. Alert and Oriented x 3. pleasant and cooperative. Notes Patient's wound bed currently shows signs of good granulation at all sites the only place where I had to perform any sharp debridement was actually the right first toe which he tolerated without complication at this time. Post debridement the wound bed appears to be doing much better which is excellent news. Electronic Signature(s) Signed: 01/19/2019 7:15:48 AM By: Worthy Keeler PA-C Entered By: Worthy Keeler on 01/17/2019 14:03:28 [name] ([id number]) -------------------------------------------------------------------------------- Physician Orders Details Patient Name: [name] Date of Service: 01/17/2019 10:30 AM Medical Record  Number: [id number] Patient Account Number: [id number] Date of Birth/Sex: [birth year] (85 y.o. F) Treating RN: Montey Hora Primary Care Provider: Grayland Ormond Other Clinician: Referring Provider: Grayland Ormond Treating Provider/Extender: Melburn Hake, Lamarr Feenstra Weeks in Treatment: 66 Verbal / Phone Orders: No Diagnosis Coding ICD-10 Coding Code Description 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, stage 4 L89.524 Pressure ulcer of left ankle, stage 4 L89.623 Pressure ulcer of left heel, stage 3 L89.892 Pressure ulcer of other site, stage 2 I70.233 Atherosclerosis of native arteries of right leg with ulceration of ankle I70.243 Atherosclerosis of native arteries of left leg with ulceration of ankle I89.0 Lymphedema, not elsewhere classified B35.4 Tinea corporis 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 o Cleanse wound with mild soap and water o May Shower, gently pat wound dry prior to applying new dressing. Wound #3 Left Calcaneus o Clean wound with Normal Saline. o Cleanse wound with mild soap and water o May Shower, gently pat wound dry prior to applying new dressing. Wound #5 Right,Distal Toe Great o Clean wound with Normal Saline. o Cleanse wound with mild soap and water o May Shower, gently pat wound dry prior to applying new dressing. Anesthetic (add to Medication List) Wound #2 Left,Lateral Malleolus o Topical Lidocaine 4% cream applied to wound bed prior to debridement (In Clinic Only). Wound #3 Left Calcaneus o Topical Lidocaine 4% cream applied to wound bed prior to debridement (In Clinic Only). Wound #5 Right,Distal Toe Great o Topical Lidocaine 4% cream applied to wound bed prior to debridement (In Clinic Only). Skin Barriers/Peri-Wound Care Wound #5 Right,Distal Toe Great o Other: - Mupiricin A[place]erg, [name] ([id number]) Primary Wound  Dressing Wound #2 Left,Lateral Malleolus o Silver Collagen - small piece of silver collagen to tunnel with iodoform packing over o Iodoform packing Gauze Wound #3 Left Calcaneus o Silver Collagen Wound #5 Right,Distal Toe Great o Silver Collagen Secondary Dressing Wound #2 Left,Lateral Malleolus o Boardered Foam Dressing - Please keep a bordered foam dressing on the right malleolus with bactroban Wound #3 Left Calcaneus o Boardered Foam Dressing - Please keep a bordered foam dressing on the right malleolus with bactroban Wound #5 Right,Distal Toe Epifania Gore  o Other - Coverlet or bandaid Dressing Change Frequency Wound #2 Left,Lateral Malleolus o Change Dressing Monday, Wednesday, Friday Wound #3 Left Calcaneus o Change Dressing Monday, Wednesday, Friday Wound #5 Right,Distal Toe Great o Change Dressing Monday, Wednesday, Friday Follow-up Appointments Wound #2 Left,Lateral Malleolus o Return Appointment in 1 week. Wound #3 Left Calcaneus o Return Appointment in 1 week. Wound #5 Right,Distal Toe Great o Return Appointment in 1 week. Edema Control Wound #2 Left,Lateral Malleolus o Patient to wear own compression stockings o Patient to wear own Velcro compression garment. o Elevate legs to the level of the heart and pump ankles as often as possible Wound #3 Left Calcaneus o Patient to wear own compression stockings o Patient to wear own Velcro compression garment. o Elevate legs to the level of the heart and pump ankles as often as possible Off-Loading Tolles, [name] ([id number]) Wound #2 Left,Lateral Malleolus o Turn and reposition every 2 hours o Other: - do not put pressure on your ankles Wound #3 Left Calcaneus o Turn and reposition every 2 hours o Other: - do not put pressure on your ankles Wound #5 Right,Distal Toe Great o Turn and reposition every 2 hours o Other: - do not put pressure on your ankles Additional  Orders / Instructions Wound #2 Left,Lateral Malleolus o Increase protein intake. o Increase protein intake. Wound #3 Left Calcaneus o Increase protein intake. o Increase protein intake. Wound #5 Right,Distal Toe Great o Increase protein intake. o Increase protein intake. Home Health Wound #2 [place] Visits - [facility]. Please visit patient Monday, Wednesday, and Friday o Home Health Nurse may visit PRN to address patientos wound care needs. o FACE TO FACE ENCOUNTER: MEDICARE and MEDICAID PATIENTS: I certify that this patient is under my care and that I had a face-to-face encounter that meets the physician face-to-face encounter requirements with this patient on this date. The encounter with the patient was in whole or in part for the following MEDICAL CONDITION: (primary reason for [place]) MEDICAL NECESSITY: I certify, that based on my findings, NURSING services are a medically necessary home health service. HOME BOUND STATUS: I certify that my clinical findings support that this patient is homebound (i.e., Due to illness or injury, pt requires aid of supportive devices such as crutches, cane, wheelchairs, walkers, the use of special transportation or the assistance of another person to leave their place of residence. There is a normal inability to leave the home and doing so requires considerable and taxing effort. Other absences are for medical reasons / religious services and are infrequent or of short duration when for other reasons). o If current dressing causes regression in wound condition, may D/C ordered dressing product/s and apply Normal Saline Moist Dressing daily until next [place] / Other MD appointment. [place] of regression in wound condition at [phone]. o Please direct any NON-WOUND related issues/requests for orders to patient's Primary Care Physician Wound #3 Left  [place] Visits - [facility]. Please visit patient Monday, Wednesday, and Friday o Home Health Nurse may visit PRN to address patientos wound care needs. o FACE TO FACE ENCOUNTER: MEDICARE and MEDICAID PATIENTS: I certify that this patient is under my care and that I had a face-to-face encounter that meets the physician face-to-face encounter requirements with this patient on this date. The encounter with the patient was in whole or in part for the following MEDICAL CONDITION: (primary reason for [place]) MEDICAL  NECESSITY: I certify, that based on my findings, NURSING services are a medically necessary home health service. HOME BOUND STATUS: I certify that my clinical findings support that this patient is homebound (i.e., Due to illness or injury, pt requires aid of supportive devices such as crutches, cane, wheelchairs, walkers, the use of special transportation or the assistance of another person to leave their place of residence. There is a normal inability to leave the home and doing so requires considerable and taxing effort. Other absences are for medical reasons / religious services and are infrequent or of short duration when for other reasons). YUMA, BLUCHER ([id number]) o If current dressing causes regression in wound condition, may D/C ordered dressing product/s and apply Normal Saline Moist Dressing daily until next [place] / Other MD appointment. [place] of regression in wound condition at [phone]. o Please direct any NON-WOUND related issues/requests for orders to patient's Primary Care Physician Wound #5 Right,Distal Toe [place] Visits - [facility]. Please visit patient Monday, Wednesday, and Friday o Home Health Nurse may visit PRN to address patientos wound care needs. o FACE TO FACE ENCOUNTER: MEDICARE and MEDICAID PATIENTS: I certify that this patient is under my  care and that I had a face-to-face encounter that meets the physician face-to-face encounter requirements with this patient on this date. The encounter with the patient was in whole or in part for the following MEDICAL CONDITION: (primary reason for [place]) MEDICAL NECESSITY: I certify, that based on my findings, NURSING services are a medically necessary home health service. HOME BOUND STATUS: I certify that my clinical findings support that this patient is homebound (i.e., Due to illness or injury, pt requires aid of supportive devices such as crutches, cane, wheelchairs, walkers, the use of special transportation or the assistance of another person to leave their place of residence. There is a normal inability to leave the home and doing so requires considerable and taxing effort. Other absences are for medical reasons / religious services and are infrequent or of short duration when for other reasons). o If current dressing causes regression in wound condition, may D/C ordered dressing product/s and apply Normal Saline Moist Dressing daily until next [place] / Other MD appointment. [place] of regression in wound condition at [phone]. o Please direct any NON-WOUND related issues/requests for orders to patient's Primary Care Physician Electronic Signature(s) Signed: 01/17/2019 4:52:28 PM By: Montey Hora Signed: 01/19/2019 7:15:48 AM By: Worthy Keeler PA-C Entered By: Montey Hora on 01/17/2019 11:26:37 Witucki, [name] ([id number]) -------------------------------------------------------------------------------- Problem List Details Patient Name: [name] Date of Service: 01/17/2019 10:30 AM Medical Record Number: [id number] Patient Account Number: [id number] Date of Birth/Sex: 1922/12/16 (85 y.o. 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 08/10/2017 No Yes ankle I70.243 Atherosclerosis of native arteries of left leg with ulceration of 07/10/2018 No Yes ankle I89.0 Lymphedema, not elsewhere classified 08/10/2017 No Yes B35.4 Tinea corporis 09/28/2017 No Yes 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, left ankle and foot 08/14/2018 No Yes Inactive Problems Resolved Problems SKYLARR, LIZ ([id number]) ICD-10 Code Description Active Date Resolved Date S90.822A Blister (nonthermal), left foot, initial encounter 10/11/2018 10/11/2018 Electronic Signature(s) Signed: 01/19/2019 7:15:48 AM By: Worthy Keeler PA-C Entered By: Worthy Keeler on 01/17/2019 11:11:18 Patchen, [name] ([id number]) -------------------------------------------------------------------------------- Progress Note Details Patient Name: [name] Date of Service: 01/17/2019 10:30 AM Medical Record Number: [id number] Patient Account Number: [id number] Date of Birth/Sex: Nov 12, 1923 (85 y.o. 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 Dr. Amalia Hailey the podiatrist saw her on 07/20/2017 and sent her to the wound center for possible hyperbaric oxygen therapy. past medical history of peripheral vascular disease, varicose veins, status post appendectomy, basal cell carcinoma excision from the left leg, cholecystectomy, pacemaker placement, right lower extremity angiography done by Dr. dew in March 2017 with placement of a stent. there is also note of a successful ablation of the right small saphenous vein done which was reviewed by ultrasound on 10/24/2016. the patient had a right small saphenous vein ablation done on 10/20/2016. The patient has never been a smoker. She has been seen by Dr. Corene Cornea dew the vascular surgeon who most recently saw her on 06/15/2017 for evaluation of ongoing problems with right leg swelling. She had a lower extremity arterial duplex examination done(02/13/17) which showed patent distal right superficial femoral artery stent and above-the-knee popliteal stent without evidence of restenosis. The ABI was more than 1.3 on the right and more than 1.3 on the left. This was consistent with noncompressible arteries due to medial calcification. The right great toe pressure and PPG waveforms are within normal limits and the left great toe pressure and PPG waveforms are decreased. he recommended she continue to wear her compression stockings and continue with elevation. She is scheduled to have a noninvasive arterial study in the near future 08/16/2017 -- had a lower extremity arterial duplex examination done which showed patent distal right superficial femoral artery stent and above-the-knee popliteal stent without evidence of restenosis. The ABI was more than 1.3 on the right and more than 1.3 on the left. This was consistent with noncompressible arteries due to medial calcification. The right great toe pressure and  PPG waveforms are within normal limits and the left great toe pressure and PPG waveforms are decreased. the x-ray of the right ankle has not yet been done 08/24/2017 -- had a right ankle x-ray -- IMPRESSION:1. No fracture, bone lesion or evidence of osteomyelitis. 2. Lateral soft tissue swelling with a soft tissue ulcer. she has not yet seen the vascular surgeon for review 08/31/17 on evaluation today patient's wound appears to be showing signs of improvement. She still with her appointment with vascular in order to review her results of her vascular study and then determine if any intervention would be recommended at that time. No fevers, chills, nausea, or vomiting noted at this time. She has been tolerating the dressing changes without complication. 09/28/17 on evaluation today patient's wound appears to show signs of good improvement in regard to the granulation  tissue which is surfacing. There is still a layer of slough covering the wound and the posterior portion is still significantly deeper than the anterior nonetheless there has been some good sign of things moving towards the better. She is going to go back to Dr. dew for reevaluation to ensure her blood flow is still appropriate. That will be before her next evaluation with [place] next week. No fevers, chills, nausea, or vomiting noted at this time. Patient does have some discomfort rated to be a 3-4/10 depending on activity specifically cleansing the wound makes it worse. 10/05/2017 -- the patient was seen by Dr. Lucky Cowboy last week and noninvasive studies showed a normal right ABI with brisk AQUILLA, SHAMBLEY. ([id number]) triphasic waveforms consistent with no arterial insufficiency including normal digital pressures. The duplex showed a patent distal right SFA stent and the proximal SFA was also normal. He was pleased with her test and thought she should have enough of perfusion for normal wound healing. He would see her back in 6 months  time. 12/21/17 on evaluation today patient appears to be doing fairly well in regard to her right lateral ankle wound. Unfortunately the main issue that she is expansion at this point is that she is having some issues with what appears to be some cellulitis in the right anterior shin. She has also been noting a little bit of uncomfortable feeling especially last night and her ankle area. I'm afraid that she made the developing a little bit of an infection. With that being said I think it is in the early stages. 12/28/17 on evaluation today patient's ankle appears to be doing excellent. She's making good progress at this point the cellulitis seems to have improved after last week's evaluation. Overall she is having no significant discomfort which is excellent news. She does have an appointment with Dr. dew on March 29, 2018 for reevaluation in regard to the stent he placed. She seems to have excellent blood flow in the right lower extremity. 01/19/12 on evaluation today patient's wound appears to be doing very well. In fact she does not appear to require debridement at this point, there's no evidence of infection, and overall from the standpoint of the wound she seems to be doing very well. With that being said I believe that it may be time to switch to different dressing away from the [facility] Dressing she tells me she does have a lot going on her friend actually passed away yesterday and she's also having a lot of issues with her husband this obviously is weighing heavy on her as far as your thoughts and concerns today. 01/25/18 on evaluation today patient appears to be doing fairly well in regard to her right lateral malleolus. She has been tolerating the dressing changes without complication. Overall I feel like this is definitely showing signs of improvement as far as how the overall appearance of the wound is there's also evidence of epithelium start to migrate over the granulation tissue. In  general I think that she is progressing nicely as far as the wound is concerned. The only concern she really has is whether or not we can switch to every other week visits in order to avoid having as many appointments as her daughters have a difficult time getting her to her appointments as well as the patient's husband to his he is not doing very well at this point. 02/22/18 on evaluation today patient's right lateral malleolus ulcer appears to be doing great. She has been  tolerating the dressing changes without complication. Overall you making excellent progress at this time. Patient is having no significant discomfort. 03/15/18 on evaluation today patient appears to be doing much more poorly in regard to her right lateral ankle ulcer at this point. Unfortunately since have last seen her her husband has passed just a few days ago is obviously weighed heavily on her her daughter also had surgery well she is with her today as usual. There does not appear to be any evidence of infection she does seem to have significant contusion/deep tissue injury to the right lateral malleolus which was not noted previous when I saw her last. It's hard to tell of exactly when this injury occurred although during the time she was spending the night in the hospital this may have been most likely. 03/22/18 on evaluation today patient appears to actually be doing very well in regard to her ulcer. She did unfortunately have a setback which was noted last week however the good news is we seem to be getting back on track and in fact the wound in the core did still have some necrotic tissue which will be addressed at this point today but in general I'm seeing signs that things are on the up and up. She is glad to hear this obviously she's been somewhat concerned that due to the how her wound digressed more recently. 03/29/18 on evaluation today patient appears to be doing fairly well in regard to her right lower extremity  lateral malleolus ulcer. She unfortunately does have a new area of pressure injury over the inferior portion where the wound has opened up a little bit larger secondary to the pressure she seems to be getting. She does tell me sometimes when she sleeps at night that it actually hurts and does seem to be pushing on the area little bit more unfortunately. There does not appear to be any evidence of infection which is good news. She has been tolerating the dressing changes without complication. She also did have some bruising in the left second and third toes due to the fact that she may have bump this or injured it although she has neuropathy so she does not feel she did move recently that may have been where this came from. Nonetheless there does not appear to be any evidence of infection at this time. 04/12/18 on evaluation today patient's wound on the right lateral ankle actually appears to be doing a little bit better with a lot of necrotic docking tissue centrally loosening up in clearing away. However she does have the beginnings of a deep tissue injury on the left lateral malleolus likely due to the fact we've been trying offload the right as much as we have. I think she may benefit from an assistive soft device to help with offloading and it looks like they're looking at one of the doughnut conditions that wraps around the lower leg to offload which I think will definitely do a good job. With that being said I think we definitely need to address this issue on the left before it becomes a wound. Patient is not having significant pain. MACHELL, WIRTHLIN ([id number]) 04/19/18 on evaluation today patient appears to be doing excellent in regard to the progress she's made with her right lateral ankle ulcer. The left ankle region which did show evidence of a deep tissue injury seems to be resolving there's little fluid noted underneath and a blister there's nothing open at this point in time overall I  feel like this is progressing nicely which is good news. She does not seem to be having significant discomfort at this point which is also good news. 04/25/18-She is here in follow up evaluation for bilateral lateral malleolar ulcers. The right lateral malleolus ulcer with pale subcutaneous tissue exposure, central area of ulcer with tendon/periosteum exposed. The left lateral malleolus ulcer now with central area of nonviable tissue, otherwise deep tissue injury. She is wearing compression wraps to the left lower extremity, she will place the right lower extremity compression wraps on when she gets home. She will be out of town over the weekend and return next week and follow-up appointment. She completed her doxycycline this morning 05/03/18 on evaluation today patient appears to be doing very well in regard to her right lateral ankle ulcer in general. At least she's showing some signs of improvement in this regard. Unfortunately she has some additional injury to the left lateral malleolus region which appears to be new likely even over the past several days. Again this determination is based on the overall appearance. With that being said the patient is obviously frustrated about this currently. 05/10/18-She is here in follow-up evaluation for bilateral lateral malleolar ulcers. She states she has purchased offloading shoes/boots and they will arrive tomorrow. She was asked to bring them in the office at next week's appointment so her provider is aware of product being utilized. She continues to sleep on right or left side, she has been encouraged to sleep on her back. The right lateral malleolus ulcer is precariously close to peri-osteum; will order xray. The left lateral malleolus ulcer is improved. Will switch back to santyl; she will follow up next week. 05/17/18 on evaluation today patient actually appears to be doing very well in regard to her malleolus her ulcers compared to last time I saw  them. She does not seem to have as much in the way of contusion at this point which is great news. With that being said she does continue to have discomfort and I do believe that she is still continuing to benefit from the offloading/pressure reducing boots that were recommended. I think this is the key to trying to get this to heal up completely. 05/24/18 on evaluation today patient actually appears to be doing worse at this point in time unfortunately compared to her last week's evaluation. She is having really no increased pain which is good news unfortunately she does have more maceration in your theme and noted surrounding the right lateral ankle the left lateral ankle is not really is erythematous I do not see signs of the overt cellulitis on that side. Unfortunately the wounds do not seem to have shown any signs of improvement since the last evaluation. She also has significant swelling especially on the right compared to previous some of this may be due to infection however also think that she may be served better while she has these wounds by compression wrapping versus continuing to use the Juxta-Lite for the time being. Especially with the amount of drainage that she is experiencing at this point. No fevers, chills, nausea, or vomiting noted at this time. 05/31/18 on evaluation today patient appears to actually be doing better in regard to her right lateral lower extremity ulcer specifically on the malleolus region. She has been tolerating the antibiotic without complication. With that being said she still continues to have issues but a little bit of redness although nothing like she what she was experiencing previous. She still continues to  pressure to her ankle area she did get the problem on offloading boots unfortunately she will not wear them she states there too uncomfortable and she can't get in and out of the bed. Nonetheless at this point her wounds seem to be continually getting  worse which is not what we want I'm getting somewhat concerned about her progress and how things are going to proceed if we do not intervene in some way shape or form. I therefore had a very lengthy conversation today about offloading yet again and even made a specific suggestion for switching her to a memory foam mattress and even gave the information for a specific one that they could look at getting if it was something that they were interested in considering. She does not want to be considered for a hospital bed air mattress although honestly insurance would not cover it that she does not have any wounds on her trunk. 06/14/18 on evaluation today both wounds over the bilateral lateral malleolus her ulcers appear to be doing better there's no evidence of pressure injury at this point. She did get the foam mattress for her bed and this does seem to have been extremely beneficial for her in my pinion. Her daughter states that she is having difficulty getting out of bed because of how soft it is. The patient also relates this to be. Nonetheless I do feel like she's actually doing better. Unfortunately right after and around the time she was getting the mattress she also sustained a fall when she got up to go pick up the phone and ended up injuring her right elbow she has 18 sut[address] place. We are not caring for this currently although home health is going to be taking the sutures out shortly. Nonetheless this may be something that we need to evaluate going forward. It depends on how well it has or has not healed in the end. She also recently saw an orthopedic specialist for an injection in the right shoulder just before her fall unfortunately the fall seems to have worsened her pain. 06/21/18 on evaluation today patient appears to be doing about the same in regard to her lateral malleolus ulcers. Both appear to be just a little bit deeper but again we are clinging away the necrotic and dead tissue  which I think is why this is progressing towards a deeper realm as opposed to improving from my measurement standpoint in that regard. Nonetheless she has been tolerating the dressing changes she absolutely hates the memory foam mattress topper that was obtained for her nonetheless [place], [place] [name] ([id number]) I do believe this is still doing excellent as far as taking care of excess pressure in regard to the lateral malleolus regions. She in fact has no pressure injury that I see whereas in weeks past it was week by week I was constantly seeing new pressure injuries. Overall I think it has been very beneficial for her. 07/03/18; patient arrives in my clinic today. She has deep punched out areas over her bilateral lateral malleoli. The area on the right has some more depth. We spent a lot of time today talking about pressure relief for these areas. This started when her daughter asked for a prescription for a memory foam mattress. I have never written a prescription for a mattress and I don't think insurances would pay for that on an ordinary bed. In any case he came up that she has foam boots that she refuses to wear. I would suggest going  to these before any other offloading issues when she is in bed. They say she is meticulous about offloading this the rest of the day 07/10/18- She is seen in follow-up evaluation for bilateral, lateral malleolus ulcers. There is no improvement in the ulcers. She has purchased and is sleeping on a memory foam mattress/overlay, she has been using the offloading boots nightly over the past week. She has a follow up appointment with vascular medicine at the end of October, in my opinion this follow up should be expedited given her deterioration and suboptimal TBI results. We will order plain film xray of the left ankle as deeper structures are palpable; would consider having MRI, regardless of xray report(s). The ulcers will be treated with iodoflex/iodosorb, she  is unable to safely change the dressings daily with santyl. 07/19/18 on evaluation today patient appears to be doing in general visually well in regard to her bilateral lateral malleolus ulcers. She has been tolerating the dressing changes without complication which is good news. With that being said we did have an x-ray performed on 07/12/18 which revealed a slight loosen see in the lateral portion of the distal left fibula which may represent artifact but underline lytic destruction or osteomyelitis could not be excluded. MRI was recommended. With that being said we can see about getting the patient scheduled for an MRI to further evaluate this area. In fact we have that scheduled currently for August 20 19,019. 07/26/18 on evaluation today patient's wound on the right lateral ankle actually appears to be doing fairly well at this point in my pinion. She has made some good progress currently. With that being said unfortunately in regard to the left lateral ankle ulcer this seems to be a little bit more problematic at this time. In fact as I further evaluated the situation she actually had bone exposed which is the first time that's been the case in the bone appear to be necrotic. Currently I did review patient's note from Dr. Bunnie Domino office with [facility]. He stated that ABI was 1.26 on the right and 0.95 on the left with good waveforms. Her perfusion is stable not reduced from previous studies and her digital waveforms were pretty good particularly on the right. His conclusion upon review of the note was that there was not much she could do to improve her perfusion and he felt she was adequate for wound healing. His suggestion was that she continued to see [place] and consider a synthetic skin graft if there was no underlying infection. He plans to see her back in six months or as needed. 08/01/18 on evaluation today patient appears to be doing better in regard to her right lateral  ankle ulcer. Her left lateral ankle ulcer is about the same she still has bone involvement in evidence of necrosis. There does not appear to be evidence of infection at this time On the right lateral lower extremity. I have started her on the Augmentin she picked this up and started this yesterday. This is to get her through until she sees infectious disease which is scheduled for 08/12/18. 08/06/18 on evaluation today patient appears to be doing rather well considering my discussion with patient's daughter at the end of last week. The area which was marked where she had erythema seems to be improved and this is good news. With that being said overall the patient seems to be making good improvement when it comes to the overall appearance of the right lateral ankle  ulcer although this has been slow she at least is coming around in this regard. Unfortunately in regard to the left lateral ankle ulcer this is osteomyelitis based on the pathology report as well is bone culture. Nonetheless we are still waiting CT scan. Unfortunately the MRI we originally ordered cannot be performed as the patient is a pacemaker which I had overlooked. Nonetheless we are working on the CT scan approval and scheduling as of now. She did go to the hospital over the weekend and was placed on IV Cefzo for a couple of days. Fortunately this seems to have improved the erythema quite significantly which is good news. There does not appear to be any evidence of worsening infection at this time. She did have some bleeding after the last debridement therefore I did not perform any sharp debridement in regard to left lateral ankle at this point. Patient has been approved for a snap vac for the right lateral ankle. 08/14/18; the patient with wounds over her bilateral lateral malleoli. The area on the right actually looks quite good. Been using a snap back on this area. Healthy granulation and appears to be filling in. Unfortunately the  area on the left is really problematic. 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 Also noted to have cellulitis. She saw Dr. Novella Olive of infectious disease today and was put on linezolid. We are able to verify this with her pharmacy. She is completed the Augmentin that she was already on. We've been using Iodoflex to this LANESSA, SHILL ([id number]) area 08/23/18 on evaluation today patient's wounds both actually appear to be doing better compared to my prior evaluations. Fortunately she showing signs of good improvement in regard to the overall wound status especially where were using the snap vac on the right. In regard to left lateral malleolus the wound bed actually appears to be much cleaner than previously noted. I do not feel any phone directly probed during evaluation today and though there is tendon noted this does not appear to be necrotic it's actually fairly good as far as the overall appearance of the tendon is concerned. In general the wound bed actually appears to be doing significantly better than it was previous. Patient is currently in the care of [name] and I did review that note today. He actually has her on two weeks of linezolid and then following the patient will be on 1-2 months of Keflex. That is the plan currently. She has been on antibiotics therapy as prescribed by myself initially starting on July 30, 2018 and has been on that continuously up to this point. 08/30/18 on evaluation today patient actually appears to be doing much better in regard to her right lateral malleolus ulcer. She has been tolerating the dressing changes specifically the snap vac without complication although she did have some issues with the seal currently. Apparently there was some trouble with getting it to maintain over the past week past Sunday. Nonetheless overall the wound appears better in regard to the  right lateral malleolus region. In regard to left lateral malleolus this actually show some signs of additional granulation although there still tendon noted in the base of the wound this appears to be healthy not necrotic in any way whatsoever. We are considering potentially using a snap vac for the left lateral malleolus as well the product wrap from [employer], [place], was present in the clinic today we're going  to see this patient I did have her come in with me after obtaining consent from the patient and her daughter in order to look at the wound and see if there's any recommendation one way or another as to whether or not they felt the snapback could be beneficial for the left lateral malleolus region. But the conclusion was that it might be but that this is definitely a little bit deeper wound than what traditionally would be utilized for a snap vac. 09/06/18 on evaluation today patient actually appears to be doing excellent in my pinion in regard to both ankle ulcers. She has been tolerating the dressing changes without complication which is great news. Specifically we have been using the snap vac. In regard to the right ankle I'm not even sure that this is going to be necessary for today and following as the wound has filled in quite nicely. In regard to the left ankle I do believe that we're seeing excellent epithelialization from the edge as well as granulation in the central portion the tendon is still exposed but there's no evidence of necrotic bone and in general I feel like the patient has made excellent progress even compared to last week with just one week of the snap vac. 09/11/18; this is a patient who has wounds on her bilateral lateral malleoli. Initially both of these were deep stage IV wounds in the setting of chronic arterial insufficiency. She has been revascularized. As I understand think she been using snap vacs to both of these wounds however the area on the right became more  superficial and currently she is only using it on the left. Using silver collagen on the right and silver collagen under the back on the left I believe 09/19/18 on evaluation today patient actually appears to be doing very well in regard to her lateral malleolus or ulcers bilaterally. She has been tolerating the dressing changes without complication. Fortunately there does not appear to be any evidence of infection at this time. Overall I feel like she is improving in an excellent manner and I'm very pleased with the fact that everything seems to be turning towards the better for her. This has obviously been a long road. 09/27/18 on evaluation today patient actually appears to be doing very well in regard to her bilateral lateral malleolus ulcers. She has been tolerating the dressing changes without complication. Fortunately there does not appear to be any evidence of infection at this time which is also great news. No fevers, chills, nausea, or vomiting noted at this time. Overall I feel like she is doing excellent with the snap vac on the left malleolus. She had 40 mL of fluid collection over the past week. 10/04/18 on evaluation today patient actually appears to be doing well in regard to her bilateral lateral malleolus ulcers. She continues to tolerate the dressing changes without complication. One issue that I see is the snap vac on the left lateral malleolus which appears to have sealed off some fluid underlying this area and has not really allowed it to heal to the degree that I would like to see. For that reason I did suggest at this point we may want to pack a small piece of packing strip into this region to allow it to more effectively wick out fluid. 10/11/18 in general the patient today does not feel that she has been doing very well. She's been a little bit lethargic and subsequently is having bodyaches as well according to what she  tells me today. With that being said overall she has  been concerned with the fact that something may be worsening although to be honest her wounds really have not been appearing poorly. She does have a new ulcer on her left heel unfortunately. This may be pressure related. Nonetheless it seems to me to have potentially started at least as a blister I do not see any evidence of deep tissue injury. In regard to the left ankle the snap vac still seems to be causing the ceiling off of the deeper part of the wound which is in turn trapping fluid. I'm not extremely pleased with the overall appearance as far as progress from last week to this week therefore I'm gonna discontinue Vondrasek, [name] ([id number]) the snap vac at this point. 10/18/18 patient unfortunately this point has not been feeling well for the past several days. She was seen by Grayland Ormond her primary care provider who is a Librarian, academic at [facility]. Subsequently she states that she's been very weak and generally feeling malaise. No fevers, chills, nausea, or vomiting noted at this time. With that being said bloodwork was performed at the PCP office on the 11th of this month which showed a white blood cell count of 10.7. This was repeated today and shows a white blood cell count of 12.4. This does show signs of worsening. Coupled with the fact that she is feeling worse and that her left ankle wound is not really showing signs of improvement I feel like this is an indication that the osteomyelitis is likely exacerbating not improving. Overall I think we may also want to check her C-reactive protein and sedimentation rate. Actually did call [facility] office this afternoon while the patient was in the office here with me. Subsequently based on the findings we discussed treatment possibilities and I think that it is appropriate for [place] to go ahead and initiate treatment with doxycycline which I'm going to do. Subsequently he did agree to see about adding a CRP  and sedimentation rate to her orders. If that has not already been drawn to where they can run it they will contact the patient she can come back to have that check. They are in agreement with plan as far as the patient and her daughter are concerned. Nonetheless also think we need to get in touch with Dr. Henreitta Leber office to see about getting the patient scheduled with him as soon as possible. 11/08/18 on evaluation today patient presents for follow-up concerning her bilateral foot and ankle ulcers. I did do an extensive review of her chart in epic today. Subsequently she was seen by [name] he did initiate Cefepime IV antibiotic therapy. Subsequently she had some issues with her PICC line this had to be removed because it was coiled and then replaced. Fortunately that was now settled. Unfortunately she has continued have issues with her left heel as well as the issues that she is experiencing with her bilateral lateral malleolus regions. I do believe however both areas seem to be doing a little bit better on evaluation today which is good news. No fevers, chills, nausea, or vomiting noted at this time. She actually has an angiogram schedule with Dr. dew on this coming Monday, November 11, 2018. Subsequently the patient states that she is feeling much better especially than what she was roughly 2 weeks ago. She actually had to cancel an appointment because she was feeling so poorly. No fevers, chills, nausea, or vomiting noted at  this time. 11/15/18 on evaluation today patient actually is status post having had her angiogram with Dr. dew Monday, four days ago. 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 He had to go and work on several areas of the vasculature fortunately he was able to obtain no more than a 30% residual stenosis throughout post procedure. I reviewed this note today. I think this will definitely help with healing at this time. Fortunately there does not  appear to be any signs of infection and I do feel like ratio already has a better appearance to it. 11/22/18 upon evaluation today patient actually appears to be doing very well in regard to her wounds in general. The right lateral malleolus looks excellent the heel looks better in the left lateral malleolus also appears to be doing a little better. With that being said the right second toe actually appears to be open and training we been watching this is been dry and stable but now is open. 12/03/2018 Seen today for follow-up and management of multiple bilateral lower extremity wounds. New pressure injury of the great toe which is closed at this time. Wound of the right distal second toe appears larger today with deep undermining and a pocket of fluid present within the undermining region. Left and right malleolus is wounds are stable today with no signs and symptoms of infection.Denies any needs or concerns during exam today. 12/13/18 on evaluation today patient appears to be doing somewhat better in regard to her left heel ulcer. She also seems to be completely healed in regard to the right lateral malleolus ulcer. The left malleolus ulcer is smaller what unfortunately the wounds which are new over the first and second toes of the right foot are what are most concerning at this point especially the second. Both areas did require sharp debridement today. 12/20/18 on evaluation today patient's wound actually appears to be doing better in regard to left lateral ankle and her right lateral ankle continues to remain healed. The hill ulcer on the left is improved. She does have improvement noted as well in regard to both toe ulcers. Overall I'm very pleased in this regard. No fevers, chills, nausea, or vomiting noted at this time. 12/23/18 on evaluation today patient is seen after she had her toenails trimmed at the podiatrist office due to issues with her right great toe. There was what appeared to be  dark eschar on the surface of the wound which had her in the podiatrist concerned. Nonetheless as I remember that during the last office visit I had utilize silver nitrate of this area I was much less concerned about the situation. Subsequently I was able to clean off much of this tissue without any complication today. This does not appear to show any signs of infection and actually look somewhat better compared to last time post debridement. Her second toe on the right foot actually had callous over and there did appear still be some fluid underneath this that would require debridement today. BEADIE, MATSUNAGA ([id number]) 12/27/18 on evaluation today patient actually appears to be showing signs of improvement at all locations. Even the left lateral ankle although this is not quite as great as the other sites. Fortunately there does not appear to be any signs of infection at this time and both of her toes on the right foot seem to be showing signs of improvement which is good news and very pleased in this regard. 01/03/19 on evaluation  today patient appears to be doing better for the most part in regard to her wounds in particular. There does not appear to be any evidence of infection at this time which is good news. Fortunately there is no sign of really worsening anywhere except for the right great toe which she does have what appears to be a bruise/deep tissue injury which is very superficial and already resolving. I'm not sure where this came from I questioned her extensively and she does not recall what may have happened with this. Other than that the patient seems to be doing well even the left lateral ankle ulcer looks good and is getting smaller. 01/10/19 on evaluation today patient appears to be doing well in regard to her left heel wound and both of her toe wounds. Overall I feel like there is definitely improvement here and I'm happy in that regard. With that being said unfortunately she  is having issues with the left lateral malleolus ulcer which unfortunately still has a lot of depth to it. This is gonna be a very difficult wound for [place] to be able to truly get to heal. I may want to consider some type of skin substitute to see if this would be of benefit for her. I'll discuss this with her more the next visit most likely. This was something I thought about more at the end of the visit when I was Artie out of the room and the patient had been discharged. 01/17/19 on evaluation today patient appears to be doing very well in regard to her wounds in general. She's been making excellent progress at this time. Fortunately there's no sign of infection at this time either. No fevers, chills, nausea, or vomiting noted at this time. The biggest issue is still her left lateral malleolus where it appears to be doing well and is getting smaller but still shows a small corner where this is deeper and goes down into what appears to be the joint space. Nonetheless this is taking much longer to heal although it still looks better in smaller than previous evaluations. Patient History Information obtained from Patient. Family History Cancer - Father,Siblings, Heart Disease - Siblings, No family history of Diabetes, Hereditary Spherocytosis, Hypertension, Kidney Disease, Lung Disease, Seizures, Stroke, Thyroid Problems, Tuberculosis. Social History Never smoker, Marital Status - Married, Alcohol Use - Never, Drug Use - No History, Caffeine Use - Rarely. Medical History Eyes Patient has history of Cataracts - surgery Cardiovascular Patient has history of Congestive Heart Failure, Hypertension Musculoskeletal Patient has history of Osteoarthritis Neurologic Patient has history of Neuropathy Oncologic Denies history of Received Chemotherapy, Received Radiation Review of Systems (ROS) Constitutional Symptoms (General Health) Denies complaints or symptoms of Fever, Chills. Respiratory The  patient has no complaints or symptoms. Cardiovascular The patient has no complaints or symptoms. Psychiatric The patient has no complaints or symptoms. AMARAH, BROSSMAN ([id number]) Objective Constitutional Well-nourished and well-hydrated in no acute distress. Vitals Time Taken: 10:40 AM, Height: 65 in, Weight: 154.3 lbs, BMI: 25.7, Temperature: 97.7 F, Pulse: 93 bpm, Respiratory Rate: 18 breaths/min, Blood Pressure: 131/54 mmHg. Respiratory normal breathing without difficulty. Psychiatric this patient is able to make decisions and demonstrates good insight into disease process. Alert and Oriented x 3. pleasant and cooperative. General Notes: Patient's wound bed currently shows signs of good granulation at all sites the only place where I had to perform any sharp debridement was actually the right first toe which he tolerated without complication at this time. Post debridement the  wound bed appears to be doing much better which is excellent news. Integumentary (Hair, Skin) Wound #2 status is Open. Original cause of wound was Gradually Appeared. The wound is located on the Left,Lateral Malleolus. The wound measures 0.8cm length x 0.7cm width x 0.1cm depth; 0.44cm^2 area and 0.044cm^3 volume. There is Fat Layer (Subcutaneous Tissue) Exposed exposed. There is no tunneling or undermining noted. There is a medium amount of serous drainage noted. The wound margin is epibole. There is large (67-100%) pink granulation within the wound bed. There is a small (1-33%) amount of necrotic tissue within the wound bed including Adherent Slough. The periwound skin appearance exhibited: Maceration, Erythema. The periwound skin appearance did not exhibit: Callus, Crepitus, Excoriation, Induration, Rash, Scarring, Dry/Scaly, Atrophie Blanche, Cyanosis, Ecchymosis, Hemosiderin Staining, Mottled, Pallor, Rubor. The surrounding wound skin color is noted with erythema which is circumferential. Periwound  temperature was noted as No Abnormality. The periwound has tenderness on palpation. Wound #3 status is Open. Original cause of wound was Gradually Appeared. The wound is located on the Left Calcaneus. The wound measures 1cm length x 1.2cm width x 0.1cm depth; 0.942cm^2 area and 0.094cm^3 volume. There is Fat Layer (Subcutaneous Tissue) Exposed exposed. There is no tunneling or undermining noted. There is a medium amount of serous drainage noted. The wound margin is indistinct and nonvisible. There is medium (34-66%) pink granulation within the wound bed. There is a medium (34-66%) amount of necrotic tissue within the wound bed including Adherent Slough. The periwound skin appearance exhibited: Dry/Scaly. The periwound skin appearance did not exhibit: Callus, Crepitus, Excoriation, Induration, Rash, Scarring, Maceration, Atrophie Blanche, Cyanosis, Ecchymosis, Hemosiderin Staining, Mottled, Pallor, Rubor, Erythema. Periwound temperature was noted as No Abnormality. The periwound has tenderness on palpation. Wound #4 status is Healed - Epithelialized. Original cause of wound was Pressure Injury. The wound is located on the Right,Distal Toe Second. The wound measures 0cm length x 0cm width x 0cm depth; 0cm^2 area and 0cm^3 volume. The wound is limited to skin breakdown. There is no tunneling or undermining noted. There is a none present amount of drainage noted. The wound margin is indistinct and nonvisible. There is no granulation within the wound bed. There is a large (67- 100%) amount of necrotic tissue within the wound bed including Eschar. The periwound skin appearance exhibited: Ecchymosis. The periwound skin appearance did not exhibit: Callus, Crepitus, Excoriation, Induration, Rash, Scarring, Dry/Scaly, Maceration, Atrophie Blanche, Cyanosis, Hemosiderin Staining, Mottled, Pallor, Rubor, Erythema. Wound #5 status is Open. Original cause of wound was Footwear Injury. The wound is located on the  Right,Distal [employer]. The wound measures 0.3cm length x 0.4cm width x 0.1cm depth; 0.094cm^2 area and 0.009cm^3 volume. There is Fat Layer (Subcutaneous Tissue) Exposed exposed. There is no tunneling or undermining noted. There is a small amount of serous Christo, CAMDYNN MARANTO. ([id number]) drainage noted. The wound margin is flat and intact. There is small (1-33%) pink granulation within the wound bed. There is a large (67-100%) amount of necrotic tissue within the wound bed including Eschar and Adherent Slough. The periwound skin appearance exhibited: Ecchymosis. The periwound skin appearance did not exhibit: Callus, Crepitus, Excoriation, Induration, Rash, Scarring, Dry/Scaly, Maceration, Atrophie Blanche, Cyanosis, Hemosiderin Staining, Mottled, Pallor, Rubor, Erythema. 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 There was a Excisional Skin/Subcutaneous Tissue Debridement with a total area of 0.12 sq cm performed by STONE III, Carthel Castille E., PA-C. With the following instrument(s): Curette to remove Viable and Non-Viable tissue/material. Material removed includes Callus, Subcutaneous Tissue, and Slough after achieving pain control using Lidocaine 4% Topical Solution. No specimens were taken. A time out was conducted at 11:21, prior to the start of the procedure. A Minimum amount of bleeding was controlled with Pressure. The procedure was tolerated well with a pain level of 0 throughout and a  pain level of 0 following the procedure. Post Debridement Measurements: 0.3cm length x 0.4cm width x 0.2cm depth; 0.019cm^3 volume. Post debridement Stage noted as Category/Stage II. Character of Wound/Ulcer Post Debridement is improved. Post procedure Diagnosis Wound #5: Same as Pre-Procedure Plan Wound Cleansing: Wound #2 Left,Lateral Malleolus: Clean wound with Normal Saline. Cleanse wound with mild soap and water May Shower, gently pat wound dry prior to applying new dressing. Wound #3 Left Calcaneus: Clean wound with Normal Saline. Cleanse wound with mild soap and water May Shower, gently pat wound dry prior to applying new dressing. TENLEE, WOLLIN ([id number]) Wound #5 Right,Distal Toe Great: Clean wound with Normal Saline. Cleanse wound with mild soap and water May Shower, gently pat wound dry prior to applying new dressing. Anesthetic (add to Medication List): Wound #2 Left,Lateral Malleolus: Topical Lidocaine 4% cream applied to wound bed prior to debridement (In Clinic Only). Wound #3 Left Calcaneus: Topical Lidocaine 4% cream applied to wound bed prior to debridement (In Clinic Only). Wound #5 Right,Distal Toe Great: Topical Lidocaine 4% cream applied to wound bed prior to debridement (In Clinic Only). Skin Barriers/Peri-Wound Care: Wound #5 Right,Distal Toe Great: Other: - Mupiricin Primary Wound Dressing: Wound #2 Left,Lateral Malleolus: Silver Collagen - small piece of silver collagen to tunnel with iodoform packing over Iodoform packing Gauze Wound #3 Left Calcaneus: Silver Collagen Wound #5 Right,Distal Toe Great: Silver Collagen Secondary Dressing: Wound #2 Left,Lateral Malleolus: Boardered Foam Dressing - Please keep a bordered foam dressing on the right malleolus with bactroban Wound #3 Left Calcaneus: Boardered Foam Dressing - Please keep a bordered foam dressing on the right malleolus with bactroban Wound #5 Right,Distal Toe Great: Other - Coverlet  or bandaid Dressing Change Frequency: Wound #2 Left,Lateral Malleolus: Change Dressing Monday, Wednesday, Friday Wound #3 Left Calcaneus: Change Dressing Monday, Wednesday, Friday Wound #5 Right,Distal Toe Great: Change Dressing Monday, Wednesday, Friday Follow-up Appointments: Wound #2 Left,Lateral Malleolus: Return Appointment in 1 week. Wound #3 Left Calcaneus: Return Appointment in 1 week. Wound #5 Right,Distal Toe Great: Return Appointment in 1 week. Edema Control: Wound #2 Left,Lateral Malleolus: Patient to wear own compression stockings Patient to wear own Velcro compression garment. Elevate legs to the level of the heart and pump ankles as often as possible Wound #3 Left Calcaneus: Patient to wear own compression stockings Patient to wear own Velcro compression garment. Elevate legs to the level of the heart and pump ankles as often as possible Off-Loading: Wound #2 Left,Lateral Malleolus: Turn and reposition every 2 hours Other: - do not put pressure on your ankles Wound #3 Left Calcaneus: Turn and reposition every 2 hours Other: - do not put pressure on your ankles Wound #5 Right,Distal Toe GreatJANNE, FAULK ([id number]) Turn and reposition  every 2 hours Other: - do not put pressure on your ankles Additional Orders / Instructions: Wound #2 Left,Lateral Malleolus: Increase protein intake. Increase protein intake. Wound #3 Left Calcaneus: Increase protein intake. Increase protein intake. Wound #5 Right,Distal Toe Great: Increase protein intake. Increase protein intake. Home Health: Wound #2 Left,Lateral Malleolus: [place] [facility] - [facility]. Please visit patient Monday, Wednesday, and Friday Home Health Nurse may visit PRN to address patient s wound care needs. FACE TO FACE ENCOUNTER: MEDICARE and MEDICAID PATIENTS: I certify that this patient is under my care and that I had a face-to-face encounter that meets the physician face-to-face  encounter requirements with this patient on this date. The encounter with the patient was in whole or in part for the following MEDICAL CONDITION: (primary reason for [place]) MEDICAL NECESSITY: I certify, that based on my findings, NURSING services are a medically necessary home health service. HOME BOUND STATUS: I certify that my clinical findings support that this patient is homebound (i.e., Due to illness or injury, pt requires aid of supportive devices such as crutches, cane, wheelchairs, walkers, the use of special transportation or the assistance of another person to leave their place of residence. There is a normal inability to leave the home and doing so requires considerable and taxing effort. Other absences are for medical reasons / religious services and are infrequent or of short duration when for other reasons). If current dressing causes regression in wound condition, may D/C ordered dressing product/s and apply Normal Saline Moist Dressing daily until next [place] / Other MD appointment. [place] of regression in wound condition at [phone]. Please direct any NON-WOUND related issues/requests for orders to patient's Primary Care Physician Wound #3 Left Calcaneus: [place] [facility] - [facility]. Please visit patient Monday, Wednesday, and Friday Home Health Nurse may visit PRN to address patient s wound care needs. FACE TO FACE ENCOUNTER: MEDICARE and MEDICAID PATIENTS: I certify that this patient is under my care and that I had a face-to-face encounter that meets the physician face-to-face encounter requirements with this patient on this date. The encounter with the patient was in whole or in part for the following MEDICAL CONDITION: (primary reason for [place]) MEDICAL NECESSITY: I certify, that based on my findings, NURSING services are a medically necessary home health service. HOME BOUND STATUS: I certify that my  clinical findings support that this patient is homebound (i.e., Due to illness or injury, pt requires aid of supportive devices such as crutches, cane, wheelchairs, walkers, the use of special transportation or the assistance of another person to leave their place of residence. There is a normal inability to leave the home and doing so requires considerable and taxing effort. Other absences are for medical reasons / religious services and are infrequent or of short duration when for other reasons). If current dressing causes regression in wound condition, may D/C ordered dressing product/s and apply Normal Saline Moist Dressing daily until next [place] / Other MD appointment. [place] of regression in wound condition at [phone]. Please direct any NON-WOUND related issues/requests for orders to patient's Primary Care Physician Wound #5 Right,Distal Toe Great: [place] [facility]its - [facility]. Please visit patient Monday, Wednesday, and Friday Home Health Nurse may visit PRN to address patient s wound care needs. FACE TO FACE ENCOUNTER: MEDICARE and MEDICAID PATIENTS: I certify that this patient is under my care and that I had a face-to-face encounter that meets  the physician face-to-face encounter requirements with this patient on this date. The encounter with the patient was in whole or in part for the following MEDICAL CONDITION: (primary reason for [place]) MEDICAL NECESSITY: I certify, that based on my findings, NURSING services are a medically necessary home health service. HOME BOUND STATUS: I certify that my clinical findings support that this patient is homebound (i.e., Due to illness or injury, pt requires aid of supportive devices such as crutches, cane, wheelchairs, walkers, the use of special transportation or the assistance of another person to leave their place of residence. There is a normal inability to leave the home and doing so  requires considerable and taxing effort. Other absences are for medical reasons / religious services and are infrequent or of short duration when for other reasons). If current dressing causes regression in wound condition, may D/C ordered dressing product/s and apply Normal Saline Moist Dressing daily until next [place] / Other MD appointment. [facility] of regression in DAYSI, BOGGAN ([id number]) wound condition at [phone]. Please direct any NON-WOUND related issues/requests for orders to patient's Primary Care Physician My suggestion currently is gonna be that we go ahead and continue with the above wound care measures at all sites. The patient is in agreement with plan. I am making a switch today we will use a little bit of the mupirocin ointment underneath the Prisma for the right great toe. She's in agreement with this plan. If anything changes worsens meantime patient will contact the office and let me know. Please see above for specific wound care orders. We will see patient for re-evaluation in 1 week(s) here in the clinic. If anything worsens or changes patient will contact our office for additional recommendations. Electronic Signature(s) Signed: 01/19/2019 7:15:48 AM By: Worthy Keeler PA-C Entered By: Worthy Keeler on 01/17/2019 14:04:00 Limb, [name] ([id number]) -------------------------------------------------------------------------------- ROS/PFSH Details Patient Name: [name] Date of Service: 01/17/2019 10:30 AM Medical Record Number: [id number] Patient Account Number: [id number] Date of Birth/Sex: 1923-08-28 (85 y.o. F) Treating RN: Montey Hora Primary Care Provider: Grayland Ormond Other Clinician: Referring Provider: Grayland Ormond Treating Provider/Extender: Melburn Hake, Cyd Hostler Weeks in Treatment: 73 Information Obtained From Patient Wound History Do you currently have one or more open woundso Yes How many  open wounds do you currently haveo 1 Approximately how long have you had your woundso 2 yrs How have you been treating your wound(s) until nowo mupirocin, soaking in epsom salt Has your wound(s) ever healed and then re-openedo No Have you had any lab work done in the past montho No Have you tested positive for an antibiotic resistant organism (MRSA, VRE)o No Have you tested positive for osteomyelitis (bone infection)o No Have you had any tests for circulation on your legso Yes Who ordered the testo Dr. Lucky Cowboy [facility] Constitutional Symptoms (General Health) Complaints and Symptoms: Negative for: Fever; Chills Eyes Medical History: Positive for: Cataracts - surgery Respiratory Complaints and Symptoms: No Complaints or Symptoms Cardiovascular Complaints and Symptoms: No Complaints or Symptoms Medical History: Positive for: Congestive Heart Failure; Hypertension Musculoskeletal Medical History: Positive for: Osteoarthritis Neurologic Medical History: Positive for: Neuropathy KEIANNA, SIGNER ([id number]) Oncologic Medical History: Negative for: Received Chemotherapy; Received Radiation Psychiatric Complaints and Symptoms: No Complaints or Symptoms HBO Extended History Items Eyes: Cataracts Immunizations Pneumococcal Vaccine: Received Pneumococcal Vaccination: Yes Implantable Devices Family and Social History Cancer: Yes - Father,Siblings; Diabetes: No; Heart Disease: Yes - Siblings; Hereditary Spherocytosis:  No; Hypertension: No; Kidney Disease: No; Lung Disease: No; Seizures: No; Stroke: No; Thyroid Problems: No; Tuberculosis: No; Never smoker; Marital Status - Married; Alcohol Use: Never; Drug Use: No History; Caffeine Use: Rarely; Financial Concerns: No; Food, Clothing or Shelter Needs: No; Support System Lacking: No; Transportation Concerns: No; Advanced Directives: No; Patient does not want information on Advanced Directives; Do not resuscitate:  No; Living Will: Yes (Not Provided); Medical Power of Attorney: No Physician Affirmation I have reviewed and agree with the above information. Electronic Signature(s) Signed: 01/17/2019 4:52:28 PM By: Montey Hora Signed: 01/19/2019 7:15:48 AM By: Worthy Keeler PA-C Entered By: Worthy Keeler on 01/17/2019 14:03:13 Justiniano, [name] ([id number]) -------------------------------------------------------------------------------- SuperBill Details Patient Name: [name] Date of Service: 01/17/2019 Medical Record Number: [id number] Patient Account Number: [id number] Date of Birth/Sex: 06-Oct-1923 (85 y.o. 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 I70.243 Atherosclerosis of native arteries of left leg with ulceration of ankle I89.0 Lymphedema, not elsewhere classified B35.4 Tinea corporis 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

## 2019-01-20 NOTE — Progress Notes (Signed)
DELILA, KUKLINSKI ([id number]) Visit Report for 01/17/2019 Arrival Information Details Patient Name: KELITA, WALLIS Date of Service: 01/17/2019 10:30 AM Medical Record Number: [id number] Patient Account Number: [id number] Date of Birth/Sex: January 01, 1923 (85 y.o. F) Treating RN: Montey Hora Primary Care Michole Lecuyer: Grayland Ormond Other Clinician: Referring Kyshon Tolliver: Grayland Ormond Treating Jewett Mcgann/Extender: Melburn Hake, HOYT Weeks in Treatment: 75 Visit Information History Since Last Visit Added or deleted any medications: No Patient Arrived: Walker Any new allergies or adverse reactions: No Arrival Time: 10:39 Had a fall or experienced change in No Accompanied By: self activities of daily living that may affect Transfer Assistance: None risk of falls: Patient Identification Verified: Yes Signs or symptoms of abuse/neglect since last visito No Secondary Verification Process Yes Hospitalized since last visit: No Completed: Implantable device outside of the clinic excluding No Patient Requires Transmission-Based No cellular tissue based products placed in the center Precautions: since last visit: Patient Has Alerts: Yes Has Dressing in Place as Prescribed: Yes Patient Alerts: ABI AVVS 03/29/18 Pain Present Now: No L 1.05 R 1.03, TBI L .56, R .85 ABI AVVS 07/23/18 L .95 R 1.26 Electronic Signature(s) Signed: 01/17/2019 4:08:56 PM By: Lorine Bears RCP, RRT, CHT Entered By: Lorine Bears on 01/17/2019 10:40:35 Tolle, [name] ([id number]) -------------------------------------------------------------------------------- Encounter Discharge Information Details Patient Name: [name] Date of Service: 01/17/2019 10:30 AM Medical Record Number: [id number] Patient Account Number: [id number] Date of Birth/Sex: January 25, 1923 (85 y.o. F) Treating RN: Montey Hora Primary Care Jams Trickett: Grayland Ormond Other Clinician: Referring Atoya Andrew:  Grayland Ormond Treating Anu Stagner/Extender: Melburn Hake, HOYT Weeks in Treatment: 45 Encounter Discharge Information Items Post Procedure Vitals Discharge Condition: Stable Temperature (F): 97.7 Ambulatory Status: Walker Pulse (bpm): 93 Discharge Destination: Home Respiratory Rate (breaths/min): 18 Transportation: Private Auto Blood Pressure (mmHg): 131/54 Accompanied By: daughter Schedule Follow-up Appointment: Yes Clinical Summary of Care: Electronic Signature(s) Signed: 01/17/2019 4:52:28 PM By: Montey Hora Entered By: Montey Hora on 01/17/2019 11:45:54 Harney, [name] ([id number]) -------------------------------------------------------------------------------- Lower Extremity Assessment Details Patient Name: [name] Date of Service: 01/17/2019 10:30 AM Medical Record Number: [id number] Patient Account Number: [id number] Date of Birth/Sex: 1923-10-11 (85 y.o. F) Treating RN: Army Melia Primary Care Meagan Ancona: Grayland Ormond Other Clinician: Referring Maire Govan: Grayland Ormond Treating Takashi Korol/Extender: Melburn Hake, HOYT Weeks in Treatment: 75 Edema Assessment Assessed: [Left: No] [Right: No] Edema: [Left: No] [Right: No] Vascular Assessment Pulses: Posterior Tibial Extremity colors, hair growth, and conditions: Extremity Color: [Left:Hyperpigmented] [Right:Hyperpigmented] Hair Growth on Extremity: [Left:No] [Right:No] Temperature of Extremity: [Left:Warm] [Right:Warm] Capillary Refill: [Left:< 3 seconds] [Right:< 3 seconds] Toe Nail Assessment Left: Right: Thick: Yes Yes Discolored: Yes Yes Deformed: No No Improper Length and Hygiene: No No Electronic Signature(s) Signed: 01/20/2019 8:46:05 AM By: Army Melia Entered By: Army Melia on 01/17/2019 10:52:30 Bates, [name] ([id number]) -------------------------------------------------------------------------------- Multi Wound Chart Details Patient Name: [name] Date of Service:  01/17/2019 10:30 AM Medical Record Number: [id number] Patient Account Number: [id number] Date of Birth/Sex: September 27, 1923 (85 y.o. F) Treating RN: Montey Hora Primary Care Sandon Yoho: Grayland Ormond Other Clinician: Referring AmeLie Hollars: Grayland Ormond Treating Earsel Shouse/Extender: STONE III, HOYT Weeks in Treatment: 75 Vital Signs Height(in): 65 Pulse(bpm): 93 Weight(lbs): 154.3 Blood Pressure(mmHg): 131/54 Body Mass Index(BMI): 26 Temperature(F): 97.7 Respiratory Rate 18 (breaths/min): Photos: [2:No Photos] [3:No Photos] [4:No Photos] Wound Location: [2:Left Malleolus - Lateral] [3:Left Calcaneus] [4:Right Toe Second - Distal] Wounding Event: [2:Gradually Appeared] [3:Gradually Appeared] [4:Pressure Injury] 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] [3:Pressure Ulcer] [4:Pressure Ulcer] 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] [3:N/A] [4:N/A] Comorbid  History: [2:Cataracts, Congestive Heart Failure, Hypertension, Osteoarthritis, Neuropathy] [3:Cataracts, Congestive Heart Failure, Hypertension, Osteoarthritis, Neuropathy] [4:Cataracts, Congestive Heart Failure, Hypertension, Osteoarthritis,  Neuropathy] Date Acquired: [2:04/12/2018] [3:10/04/2018] [4:10/23/2018] Weeks of Treatment: [2:39] [3:14] [4:8] Wound Status: [2:Open] [3:Open] [4:Open] Measurements L x W x D [2:0.8x0.7x0.1] [3:1x1.2x0.1] [4:0.1x0.1x0.1] (cm) Area (cm) : [2:0.44] [3:0.942] [4:0.008] Volume (cm) : [2:0.044] [3:0.094] [4:0.001] % Reduction in Area: [2:43.90%] [3:65.70%] [4:88.70%] % Reduction in Volume: [2:44.30%] [3:82.90%] [4:85.70%] Classification: [2:Category/Stage IV] [3:Category/Stage II] [4:Category/Stage II] Exudate Amount: [2:Medium] [3:Medium] [4:None Present] Exudate Type: [2:Serous] [3:Serous] [4:N/A] Exudate Color: [2:amber] [3:amber] [4:N/A] Wound Margin: [2:Epibole] [3:Indistinct, nonvisible] [4:Indistinct, nonvisible] Granulation Amount: [2:Large (67-100%)] [3:Medium (34-66%)] [4:None  Present (0%)] Granulation Quality: [2:Pink] [3:Pink] [4:N/A] Necrotic Amount: [2:Small (1-33%)] [3:Medium (34-66%)] [4:Large (67-100%)] Necrotic Tissue: [2:Adherent Slough] [3:Adherent Slough] [4:Eschar] Exposed Structures: [2:Fat Layer (Subcutaneous Tissue) Exposed: Yes Fascia: No Tendon: No Muscle: No Joint: No Bone: No] [3:Fat Layer (Subcutaneous Tissue) Exposed: Yes Fascia: No Tendon: No Muscle: No Joint: No Bone: No] [4:Fascia: No Fat Layer (Subcutaneous Tissue)  Exposed: No Tendon: No Muscle: No Joint: No Bone: No Limited to Skin Breakdown] Epithelialization: [2:Small (1-33%)] [3:Small (1-33%)] [4:Large (67-100%)] Periwound Skin Texture: [2:Excoriation: No Induration: No] [3:Excoriation: No Induration: No] [4:Excoriation: No Induration: No] Callus: No Callus: No Callus: No Crepitus: No Crepitus: No Crepitus: No Rash: No Rash: No Rash: No Scarring: No Scarring: No Scarring: No Periwound Skin Moisture: Maceration: Yes Dry/Scaly: Yes Maceration: No Dry/Scaly: No Maceration: No Dry/Scaly: No Periwound Skin Color: Erythema: Yes Atrophie Blanche: No Ecchymosis: Yes Atrophie Blanche: No Cyanosis: No Atrophie Blanche: No Cyanosis: No Ecchymosis: No Cyanosis: No Ecchymosis: No Erythema: No Erythema: No Hemosiderin Staining: No Hemosiderin Staining: No Hemosiderin Staining: No Mottled: No Mottled: No Mottled: No Pallor: No Pallor: No Pallor: No Rubor: No Rubor: No Rubor: No Erythema Location: Circumferential N/A N/A Temperature: No Abnormality No Abnormality N/A Tenderness on Palpation: Yes Yes No Wound Preparation: Ulcer Cleansing: Ulcer Cleansing: Ulcer Cleansing: Rinsed/Irrigated with Saline Rinsed/Irrigated with Saline Rinsed/Irrigated with Saline Topical Anesthetic Applied: Topical Anesthetic Applied: Topical Anesthetic Applied: Other: lidocaine 4% Other: lidocaine 4% Other: lidocaine 4% Wound Number: 5 N/A N/A Photos: No Photos N/A N/A Wound  Location: Right Toe Great - Distal N/A N/A Wounding Event: Footwear Injury N/A N/A Primary Etiology: Pressure Ulcer N/A N/A Secondary Etiology: N/A N/A N/A Comorbid History: Cataracts, Congestive Heart N/A N/A Failure, Hypertension, Osteoarthritis, Neuropathy Date Acquired: 12/03/2018 N/A N/A Weeks of Treatment: 6 N/A N/A Wound Status: Open N/A N/A Measurements L x W x D 0.3x0.4x0.1 N/A N/A (cm) Area (cm) : 0.094 N/A N/A Volume (cm) : 0.009 N/A N/A % Reduction in Area: 73.40% N/A N/A % Reduction in Volume: 74.30% N/A N/A Classification: Category/Stage II N/A N/A Exudate Amount: Small N/A N/A Exudate Type: Serous N/A N/A Exudate Color: amber N/A N/A Wound Margin: Flat and Intact N/A N/A Granulation Amount: Small (1-33%) N/A N/A Granulation Quality: Pink N/A N/A Necrotic Amount: Large (67-100%) N/A N/A Necrotic Tissue: Eschar, Adherent Slough N/A N/A Exposed Structures: Fat Layer (Subcutaneous N/A N/A Tissue) Exposed: Yes Fascia: No Tendon: No Muscle: No Joint: No Bone: No Welden, [name] ([id number]) Epithelialization: None N/A N/A Periwound Skin Texture: Excoriation: No N/A N/A Induration: No Callus: No Crepitus: No Rash: No Scarring: No Periwound Skin Moisture: Maceration: No N/A N/A Dry/Scaly: No Periwound Skin Color: Ecchymosis: Yes N/A N/A Atrophie Blanche: No Cyanosis: No Erythema: No Hemosiderin Staining: No Mottled: No Pallor: No Rubor: No Erythema Location: N/A N/A N/A Temperature: N/A N/A N/A Tenderness on Palpation:  No N/A N/A Wound Preparation: Ulcer Cleansing: N/A N/A Rinsed/Irrigated with Saline Topical Anesthetic Applied: Other: lidocaine 4% Treatment Notes Electronic Signature(s) Signed: 01/17/2019 4:52:28 PM By: Montey Hora Entered By: Montey Hora on 01/17/2019 11:14:18 Hartlage, [name] ([id number]) -------------------------------------------------------------------------------- [place]  Details Patient Name: [name] Date of Service: 01/17/2019 10:30 AM Medical Record Number: [id number] Patient Account Number: [id number] Date of Birth/Sex: 1923-10-03 (85 y.o. F) Treating RN: Montey Hora Primary Care Destony Prevost: Grayland Ormond Other Clinician: Referring Shenica Holzheimer: Grayland Ormond Treating Drayk Humbarger/Extender: Melburn Hake, HOYT Weeks in Treatment: 79 Active Inactive Abuse / Safety / Falls / Self Care Management Nursing Diagnoses: Potential for falls Goals: Patient will not experience any injury related to falls Date Initiated: 08/10/2017 Target Resolution Date: 11/10/2017 Goal Status: Active Interventions: Assess Activities of Daily Living upon admission and as needed Assess fall risk on admission and as needed Assess: immobility, friction, shearing, incontinence upon admission and as needed Notes: Nutrition Nursing Diagnoses: Imbalanced nutrition Potential for alteratiion in Nutrition/Potential for imbalanced nutrition Goals: Patient/caregiver agrees to and verbalizes understanding of need to use nutritional supplements and/or vitamins as prescribed Date Initiated: 08/10/2017 Target Resolution Date: 12/08/2017 Goal Status: Active Interventions: Assess patient nutrition upon admission and as needed per policy Notes: Orientation to the Wound Care Program Nursing Diagnoses: Knowledge deficit related to the wound healing center program Goals: Patient/caregiver will verbalize understanding of the [place] Program Date Initiated: 08/10/2017 Target Resolution Date: 09/08/2017 Goal Status: Active SHERRI, MCARTHY ([id number]) Interventions: Provide education on orientation to the wound center Notes: Pain, Acute or Chronic Nursing Diagnoses: Pain, acute or chronic: actual or potential Potential alteration in comfort, pain Goals: Patient/caregiver will verbalize adequate pain control between visits Date Initiated: 08/10/2017 Target Resolution Date:  12/08/2017 Goal Status: Active Interventions: Complete pain assessment as per visit requirements Notes: Wound/Skin Impairment Nursing Diagnoses: Impaired tissue integrity Knowledge deficit related to ulceration/compromised skin integrity Goals: Ulcer/skin breakdown will have a volume reduction of 80% by week 12 Date Initiated: 08/10/2017 Target Resolution Date: 12/01/2017 Goal Status: Active Interventions: Assess patient/caregiver ability to perform ulcer/skin care regimen upon admission and as needed Notes: Electronic Signature(s) Signed: 01/17/2019 4:52:28 PM By: Montey Hora Entered By: Montey Hora on 01/17/2019 11:14:08 Sidener, [name] ([id number]) -------------------------------------------------------------------------------- Pain Assessment Details Patient Name: [name] Date of Service: 01/17/2019 10:30 AM Medical Record Number: [id number] Patient Account Number: [id number] Date of Birth/Sex: 01/02/1923 (85 y.o. F) Treating RN: Montey Hora Primary Care Crislyn Willbanks: Grayland Ormond Other Clinician: Referring Erlin Gardella: Grayland Ormond Treating Asti Mackley/Extender: Melburn Hake, HOYT Weeks in Treatment: 13 Active Problems Location of Pain Severity and Description of Pain Patient Has Paino No Site Locations Pain Management and Medication Current Pain Management: Electronic Signature(s) Signed: 01/17/2019 4:08:56 PM By: Paulla Fore, RRT, CHT Signed: 01/17/2019 4:52:28 PM By: Montey Hora Entered By: Lorine Bears on 01/17/2019 10:40:43 Vanduyn, [name] ([id number]) -------------------------------------------------------------------------------- Patient/Caregiver Education Details Patient Name: [name] Date of Service: 01/17/2019 10:30 AM Medical Record Number: [id number] Patient Account Number: [id number] Date of Birth/Gender: 07/17/1923 (85 y.o. F) Treating RN: Montey Hora Primary Care Physician: Grayland Ormond Other Clinician: Referring Physician: Grayland Ormond Treating Physician/Extender: Sharalyn Ink in Treatment: 61 Education Assessment Education Provided To: Patient Education Topics Provided Wound/Skin Impairment: Handouts: Caring for Your Ulcer Methods: Demonstration, Explain/Verbal Responses: State content correctly Electronic Signature(s) Signed: 01/17/2019 4:52:28 PM By: Montey Hora Entered By: Montey Hora on 01/17/2019 11:46:07 Hemmelgarn, [name] ([id number]) -------------------------------------------------------------------------------- Wound Assessment Details Patient Name: Marlou Starks  G. Date of Service: 01/17/2019 10:30 AM Medical Record Number: [id number] Patient Account Number: [id number] Date of Birth/Sex: [birth year] (85 y.o. F) Treating RN: Army Melia Primary Care Amya Hlad: Grayland Ormond Other Clinician: Referring Rhyleigh Grassel: Grayland Ormond Treating Serenna Deroy/Extender: STONE III, HOYT Weeks in Treatment: 75 Wound Status Wound Number: 2 Primary Pressure Ulcer Etiology: Wound Location: Left Malleolus - Lateral Secondary Arterial Insufficiency Ulcer Wounding Event: Gradually Appeared Etiology: Date Acquired: 04/12/2018 Wound Status: Open Weeks Of Treatment: 39 Comorbid Cataracts, Congestive Heart Failure, Clustered Wound: No History: Hypertension, Osteoarthritis, Neuropathy Photos Photo Uploaded By: Gretta Cool, BSN, RN, CWS, Kim on 01/17/2019 15:58:49 Wound Measurements Length: (cm) 0.8 Width: (cm) 0.7 Depth: (cm) 0.1 Area: (cm) 0.44 Volume: (cm) 0.044 % Reduction in Area: 43.9% % Reduction in Volume: 44.3% Epithelialization: Small (1-33%) Tunneling: No Undermining: No Wound Description Classification: Category/Stage IV Foul Odor Wound Margin: Epibole Slough/Fi Exudate Amount: Medium Exudate Type: Serous Exudate Color: amber After Cleansing: No brino Yes Wound Bed Granulation Amount: Large (67-100%) Exposed  Structure Granulation Quality: Pink Fascia Exposed: No Necrotic Amount: Small (1-33%) Fat Layer (Subcutaneous Tissue) Exposed: Yes Necrotic Quality: Adherent Slough Tendon Exposed: No Muscle Exposed: No Joint Exposed: No Bone Exposed: No Periwound Skin Texture Sampey, [name] ([id number]) Texture Color No Abnormalities Noted: No No Abnormalities Noted: No Callus: No Atrophie Blanche: No Crepitus: No Cyanosis: No Excoriation: No Ecchymosis: No Induration: No Erythema: Yes Rash: No Erythema Location: Circumferential Scarring: No Hemosiderin Staining: No Mottled: No Moisture Pallor: No No Abnormalities Noted: No Rubor: No Dry / Scaly: No Maceration: Yes Temperature / Pain Temperature: No Abnormality Tenderness on Palpation: Yes Wound Preparation Ulcer Cleansing: Rinsed/Irrigated with Saline Topical Anesthetic Applied: Other: lidocaine 4%, Treatment Notes Wound #2 (Left, Lateral Malleolus) Electronic Signature(s) Signed: 01/20/2019 8:46:05 AM By: Army Melia Entered By: Army Melia on 01/17/2019 10:51:14 Fuerstenberg, [name] ([id number]) -------------------------------------------------------------------------------- Wound Assessment Details Patient Name: [name] Date of Service: 01/17/2019 10:30 AM Medical Record Number: [id number] Patient Account Number: [id number] Date of Birth/Sex: [birth year] (85 y.o. F) Treating RN: Army Melia Primary Care Raif Chachere: Grayland Ormond Other Clinician: Referring Ngoc Detjen: Grayland Ormond Treating Blase Beckner/Extender: STONE III, HOYT Weeks in Treatment: 75 Wound Status Wound Number: 3 Primary Pressure Ulcer Etiology: Wound Location: Left Calcaneus Wound Open Wounding Event: Gradually Appeared Status: Date Acquired: 10/04/2018 Comorbid Cataracts, Congestive Heart Failure, Weeks Of Treatment: 14 History: Hypertension, Osteoarthritis, Neuropathy Clustered Wound: No Photos Photo Uploaded By: Gretta Cool, BSN, RN, CWS,  Kim on 01/17/2019 15:59:24 Wound Measurements Length: (cm) 1 Width: (cm) 1.2 Depth: (cm) 0.1 Area: (cm) 0.942 Volume: (cm) 0.094 % Reduction in Area: 65.7% % Reduction in Volume: 82.9% Epithelialization: Small (1-33%) Tunneling: No Undermining: No Wound Description Classification: Category/Stage II Foul Odor Wound Margin: Indistinct, nonvisible Slough/Fi Exudate Amount: Medium Exudate Type: Serous Exudate Color: amber After Cleansing: No brino Yes Wound Bed Granulation Amount: Medium (34-66%) Exposed Structure Granulation Quality: Pink Fascia Exposed: No Necrotic Amount: Medium (34-66%) Fat Layer (Subcutaneous Tissue) Exposed: Yes Necrotic Quality: Adherent Slough Tendon Exposed: No Muscle Exposed: No Joint Exposed: No Bone Exposed: No Periwound Skin Texture Oscarson, [name] ([id number]) Texture Color No Abnormalities Noted: No No Abnormalities Noted: No Callus: No Atrophie Blanche: No Crepitus: No Cyanosis: No Excoriation: No Ecchymosis: No Induration: No Erythema: No Rash: No Hemosiderin Staining: No Scarring: No Mottled: No Pallor: No Moisture Rubor: No No Abnormalities Noted: No Dry / Scaly: Yes Temperature / Pain Maceration: No Temperature: No Abnormality Tenderness on Palpation: Yes Wound Preparation Ulcer Cleansing: Rinsed/Irrigated with Saline Topical Anesthetic Applied: Other:  lidocaine 4%, Treatment Notes Wound #3 (Left Calcaneus) Electronic Signature(s) Signed: 01/20/2019 8:46:05 AM By: Army Melia Entered By: Army Melia on 01/17/2019 10:51:28 Hechler, [name] ([id number]) -------------------------------------------------------------------------------- Wound Assessment Details Patient Name: [name] Date of Service: 01/17/2019 10:30 AM Medical Record Number: [id number] Patient Account Number: [id number] Date of Birth/Sex: 1923/07/18 (85 y.o. F) Treating RN: Montey Hora Primary Care Taylie Helder: Grayland Ormond Other  Clinician: Referring Shala Baumbach: Grayland Ormond Treating Yajaira Doffing/Extender: STONE III, HOYT Weeks in Treatment: 75 Wound Status Wound Number: 4 Primary Pressure Ulcer Etiology: Wound Location: Right, Distal Toe Second Wound Healed - Epithelialized Wounding Event: Pressure Injury Status: Date Acquired: 10/23/2018 Comorbid Cataracts, Congestive Heart Failure, Weeks Of Treatment: 8 History: Hypertension, Osteoarthritis, Neuropathy Clustered Wound: No Photos Photo Uploaded By: Gretta Cool, BSN, RN, CWS, Kim on 01/17/2019 16:00:09 Wound Measurements Length: (cm) 0 % Reduct Width: (cm) 0 % Reduct Depth: (cm) 0 Epitheli Area: (cm) 0 Tunneli Volume: (cm) 0 Undermi ion in Area: 100% ion in Volume: 100% alization: Large (67-100%) ng: No ning: No Wound Description Classification: Category/Stage II Foul Odo Wound Margin: Indistinct, nonvisible Slough/F Exudate Amount: None Present r After Cleansing: No ibrino No Wound Bed Granulation Amount: None Present (0%) Exposed Structure Necrotic Amount: Large (67-100%) Fascia Exposed: No Necrotic Quality: Eschar Fat Layer (Subcutaneous Tissue) Exposed: No Tendon Exposed: No Muscle Exposed: No Joint Exposed: No Bone Exposed: No Limited to Skin Breakdown Periwound Skin Texture Texture Color Reh, [name] ([id number]) No Abnormalities Noted: No No Abnormalities Noted: No Callus: No Atrophie Blanche: No Crepitus: No Cyanosis: No Excoriation: No Ecchymosis: Yes Induration: No Erythema: No Rash: No Hemosiderin Staining: No Scarring: No Mottled: No Pallor: No Moisture Rubor: No No Abnormalities Noted: No Dry / Scaly: No Maceration: No Wound Preparation Ulcer Cleansing: Rinsed/Irrigated with Saline Topical Anesthetic Applied: Other: lidocaine 4%, Electronic Signature(s) Signed: 01/17/2019 4:52:28 PM By: Montey Hora Entered By: Montey Hora on 01/17/2019 11:25:00 Rainwater, [name]  ([id number]) -------------------------------------------------------------------------------- Wound Assessment Details Patient Name: [name] Date of Service: 01/17/2019 10:30 AM Medical Record Number: [id number] Patient Account Number: [id number] Date of Birth/Sex: 09-04-1923 (85 y.o. F) Treating RN: Army Melia Primary Care Talullah Abate: Grayland Ormond Other Clinician: Referring Luann Aspinwall: Grayland Ormond Treating Dewaine Morocho/Extender: STONE III, HOYT Weeks in Treatment: 75 Wound Status Wound Number: 5 Primary Pressure Ulcer Etiology: Wound Location: Right Toe Great - Distal Wound Open Wounding Event: Footwear Injury Status: Date Acquired: 12/03/2018 Comorbid Cataracts, Congestive Heart Failure, Weeks Of Treatment: 6 History: Hypertension, Osteoarthritis, Neuropathy Clustered Wound: No Photos Photo Uploaded By: Gretta Cool, BSN, RN, CWS, Kim on 01/17/2019 16:00:49 Wound Measurements Length: (cm) 0.3 Width: (cm) 0.4 Depth: (cm) 0.1 Area: (cm) 0.094 Volume: (cm) 0.009 % Reduction in Area: 73.4% % Reduction in Volume: 74.3% Epithelialization: None Tunneling: No Undermining: No Wound Description Classification: Category/Stage II Foul Odor Wound Margin: Flat and Intact Slough/Fi Exudate Amount: Small Exudate Type: Serous Exudate Color: amber After Cleansing: No brino Yes Wound Bed Granulation Amount: Small (1-33%) Exposed Structure Granulation Quality: Pink Fascia Exposed: No Necrotic Amount: Large (67-100%) Fat Layer (Subcutaneous Tissue) Exposed: Yes Necrotic Quality: Eschar, Adherent Slough Tendon Exposed: No Muscle Exposed: No Joint Exposed: No Bone Exposed: No Periwound Skin Texture Decoste, [name] ([id number]) Texture Color No Abnormalities Noted: No No Abnormalities Noted: No Callus: No Atrophie Blanche: No Crepitus: No Cyanosis: No Excoriation: No Ecchymosis: Yes Induration: No Erythema: No Rash: No Hemosiderin Staining: No Scarring:  No Mottled: No Pallor: No Moisture Rubor: No No Abnormalities Noted: No Dry / Scaly: No Maceration: No  Wound Preparation Ulcer Cleansing: Rinsed/Irrigated with Saline Topical Anesthetic Applied: Other: lidocaine 4%, Treatment Notes Wound #5 (Right, Distal Toe Great) Electronic Signature(s) Signed: 01/20/2019 8:46:05 AM By: Army Melia Entered By: Army Melia on 01/17/2019 10:51:57 [place], [name] ([id number]) -------------------------------------------------------------------------------- [place] Details Patient Name: [name] Date of Service: 01/17/2019 10:30 AM Medical Record Number: [id number] Patient Account Number: [id number] Date of Birth/Sex: May 14, 1923 (85 y.o. F) Treating RN: Montey Hora Primary Care Gladyse Corvin: Grayland Ormond Other Clinician: Referring Stevi Hollinshead: Grayland Ormond Treating Jerri Glauser/Extender: Melburn Hake, HOYT Weeks in Treatment: 75 Vital Signs Time Taken: 10:40 Temperature (F): 97.7 Height (in): 65 Pulse (bpm): 93 Weight (lbs): 154.3 Respiratory Rate (breaths/min): 18 Body Mass Index (BMI): 25.7 Blood Pressure (mmHg): 131/54 Reference Range: 80 - 120 mg / dl Airway Electronic Signature(s) Signed: 01/17/2019 4:08:56 PM By: Lorine Bears RCP, RRT, CHT Entered By: Becky Sax, Amado Nash on 01/17/2019 10:41:09

## 2019-01-24 ENCOUNTER — Encounter (INDEPENDENT_AMBULATORY_CARE_PROVIDER_SITE_OTHER): Payer: Medicare Other

## 2019-01-24 ENCOUNTER — Ambulatory Visit (INDEPENDENT_AMBULATORY_CARE_PROVIDER_SITE_OTHER): Payer: Medicare Other | Admitting: Vascular Surgery

## 2019-01-24 ENCOUNTER — Encounter: Payer: Medicare Other | Admitting: Physician Assistant

## 2019-01-24 DIAGNOSIS — I70243 Atherosclerosis of native arteries of left leg with ulceration of ankle: Secondary | ICD-10-CM | POA: Diagnosis not present

## 2019-01-28 NOTE — Progress Notes (Signed)
[name], [name] ([id number]) Visit Report for 01/24/2019 Arrival Information Details Patient Name: [name], [name]. Date of Service: 01/24/2019 10:30 AM Medical Record Number: [id number] Patient Account Number: [id number] Date of Birth/Sex: 04-02-1923 (85 y.o. F) Treating RN: Montey Hora Primary Care Taysha Majewski: Grayland Ormond Other Clinician: Referring Markcus Lazenby: Grayland Ormond Treating Leotha Voeltz/Extender: Melburn Hake, HOYT Weeks in Treatment: 34 Visit Information History Since Last Visit Added or deleted any medications: No Patient Arrived: Walker Any new allergies or adverse reactions: No Arrival Time: 10:32 Had a fall or experienced change in No Accompanied By: self activities of daily living that may affect Transfer Assistance: None risk of falls: Patient Identification Verified: Yes Signs or symptoms of abuse/neglect since last visito No Secondary Verification Process Yes Hospitalized since last visit: No Completed: Implantable device outside of the clinic excluding No Patient Requires Transmission-Based No cellular tissue based products placed in the center Precautions: since last visit: Patient Has Alerts: Yes Has Dressing in Place as Prescribed: Yes Patient Alerts: ABI AVVS 03/29/18 Pain Present Now: No L 1.05 R 1.03, TBI L .56, R .85 ABI AVVS 07/23/18 L .95 R 1.26 Electronic Signature(s) Signed: 01/24/2019 3:46:48 PM By: Lorine Bears RCP, RRT, CHT Entered By: Lorine Bears on 01/24/2019 10:33:16 Ryant, [name] ([id number]) -------------------------------------------------------------------------------- Encounter Discharge Information Details Patient Name: [name] Date of Service: 01/24/2019 10:30 AM Medical Record Number: [id number] Patient Account Number: [id number] Date of Birth/Sex: 1923-09-28 (85 y.o. F) Treating RN: Army Melia Primary Care Chelsa Stout: Grayland Ormond Other Clinician: Referring Keora Eccleston:  Grayland Ormond Treating Cordai Rodrigue/Extender: Melburn Hake, HOYT Weeks in Treatment: 76 Encounter Discharge Information Items Post Procedure Vitals Discharge Condition: Stable Temperature (F): 98.2 Ambulatory Status: Walker Pulse (bpm): 76 Discharge Destination: Home Respiratory Rate (breaths/min): 16 Transportation: Private Auto Blood Pressure (mmHg): 117/46 Accompanied By: self Schedule Follow-up Appointment: Yes Clinical Summary of Care: Electronic Signature(s) Signed: 01/24/2019 11:51:32 AM By: Army Melia Entered By: Army Melia on 01/24/2019 11:51:31 Hubers, [name] ([id number]) -------------------------------------------------------------------------------- Lower Extremity Assessment Details Patient Name: [name] Date of Service: 01/24/2019 10:30 AM Medical Record Number: [id number] Patient Account Number: [id number] Date of Birth/Sex: 02/23/1923 (85 y.o. F) Treating RN: Army Melia Primary Care Verenis Nicosia: Grayland Ormond Other Clinician: Referring Bradshaw Minihan: Grayland Ormond Treating Spike Desilets/Extender: Melburn Hake, HOYT Weeks in Treatment: 76 Edema Assessment Assessed: [Left: No] [Right: No] Edema: [Left: No] [Right: No] Vascular Assessment Pulses: Dorsalis Pedis Palpable: [Left:Yes] [Right:Yes] Posterior Tibial Extremity colors, hair growth, and conditions: Extremity Color: [Left:Hyperpigmented] [Right:Hyperpigmented] Hair Growth on Extremity: [Left:Yes] [Right:Yes] Temperature of Extremity: [Left:Warm] [Right:Warm] Capillary Refill: [Left:< 3 seconds] [Right:< 3 seconds] Toe Nail Assessment Left: Right: Thick: Yes Yes Discolored: No No Deformed: No No Improper Length and Hygiene: No No Electronic Signature(s) Signed: 01/24/2019 4:09:31 PM By: Army Melia Entered By: Army Melia on 01/24/2019 10:44:10 Harries, [name] ([id number]) -------------------------------------------------------------------------------- Multi Wound Chart Details Patient  Name: [name] Date of Service: 01/24/2019 10:30 AM Medical Record Number: [id number] Patient Account Number: [id number] Date of Birth/Sex: [birth year] (85 y.o. F) Treating RN: Harold Barban Primary Care Pattricia Weiher: Grayland Ormond Other Clinician: Referring Joshawa Dubin: Grayland Ormond Treating Kayli Beal/Extender: STONE III, HOYT Weeks in Treatment: 76 Vital Signs Height(in): 65 Pulse(bpm): 76 Weight(lbs): 154.3 Blood Pressure(mmHg): 117/46 Body Mass Index(BMI): 26 Temperature(F): 98.2 Respiratory Rate 16 (breaths/min): Photos: [2:No Photos] [3:No Photos] [5:No Photos] Wound Location: [2:Left Malleolus - Lateral] [3:Left Calcaneus] [5:Right Toe Great - Distal] Wounding Event: [2:Gradually Appeared] [3:Gradually Appeared] [5:Footwear Injury] 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] [3:Pressure Ulcer] [5:Pressure Ulcer] 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] [3:N/A] [5:N/A] Comorbid History: [2:Cataracts, Congestive Heart Failure, Hypertension, Osteoarthritis, Neuropathy] [3:Cataracts, Congestive Heart Failure, Hypertension, Osteoarthritis, Neuropathy] [5:Cataracts, Congestive Heart Failure, Hypertension, Osteoarthritis,  Neuropathy] Date Acquired: [2:04/12/2018] [3:10/04/2018] [5:12/03/2018] Weeks of Treatment: [2:40] [3:15] [5:7] Wound Status: [2:Open] [3:Open] [5:Open] Measurements L x W x D [2:0.7x0.6x0.2] [3:0.9x0.1x0.1] [5:0.2x0.2x0.1] (cm) Area (cm) : [2:0.33] [3:0.071] [5:0.031] Volume (cm) : [2:0.066] [3:0.007] [5:0.003] % Reduction in Area: [2:58.00%] [3:97.40%] [5:91.20%] % Reduction in Volume: [2:16.50%] [3:98.70%] [5:91.40%] Classification: [2:Category/Stage IV] [3:Category/Stage II] [5:Category/Stage II] Exudate Amount: [2:Medium] [3:Medium] [5:Small] Exudate Type: [2:Serous] [3:Serous] [5:Serous] Exudate Color: [2:amber] [3:amber] [5:amber] Wound Margin: [2:Epibole] [3:Indistinct, nonvisible] [5:Flat and Intact] Granulation Amount: [2:Large  (67-100%)] [3:Medium (34-66%)] [5:Small (1-33%)] Granulation Quality: [2:Pink] [3:Pink] [5:Pink] Necrotic Amount: [2:Small (1-33%)] [3:Medium (34-66%)] [5:Large (67-100%)] Necrotic Tissue: [2:Adherent Slough] [3:Adherent Slough] [5:Eschar, Adherent Slough] Exposed Structures: [2:Fat Layer (Subcutaneous Tissue) Exposed: Yes Fascia: No Tendon: No Muscle: No Joint: No Bone: No] [3:Fat Layer (Subcutaneous Tissue) Exposed: Yes Fascia: No Tendon: No Muscle: No Joint: No Bone: No] [5:Fat Layer (Subcutaneous Tissue) Exposed: Yes  Fascia: No Tendon: No Muscle: No Joint: No Bone: No] Epithelialization: [2:Small (1-33%)] [3:Small (1-33%)] [5:None] Periwound Skin Texture: [2:Excoriation: No Induration: No Callus: No] [3:Excoriation: No Induration: No Callus: No] [5:Excoriation: No Induration: No Callus: No] Crepitus: No Crepitus: No Crepitus: No Rash: No Rash: No Rash: No Scarring: No Scarring: No Scarring: No Periwound Skin Moisture: Maceration: Yes Dry/Scaly: Yes Maceration: No Dry/Scaly: No Maceration: No Dry/Scaly: No Periwound Skin Color: Erythema: Yes Atrophie Blanche: No Ecchymosis: Yes Atrophie Blanche: No Cyanosis: No Atrophie Blanche: No Cyanosis: No Ecchymosis: No Cyanosis: No Ecchymosis: No Erythema: No Erythema: No Hemosiderin Staining: No Hemosiderin Staining: No Hemosiderin Staining: No Mottled: No Mottled: No Mottled: No Pallor: No Pallor: No Pallor: No Rubor: No Rubor: No Rubor: No Erythema Location: Circumferential N/A N/A Temperature: No Abnormality No Abnormality N/A Tenderness on Palpation: Yes Yes No Wound Preparation: Ulcer Cleansing: Ulcer Cleansing: Ulcer Cleansing: Rinsed/Irrigated with Saline Rinsed/Irrigated with Saline Rinsed/Irrigated with Saline Topical Anesthetic Applied: Topical Anesthetic Applied: Topical Anesthetic Applied: Other: lidocaine 4% Other: lidocaine 4% Other: lidocaine 4% Treatment Notes Electronic Signature(s) Signed:  01/28/2019 11:13:25 AM By: Harold Barban Entered By: Harold Barban on 01/24/2019 11:24:09 Enyeart, [name] ([id number]) -------------------------------------------------------------------------------- [place] Details Patient Name: [name] Date of Service: 01/24/2019 10:30 AM Medical Record Number: [id number] Patient Account Number: [id number] Date of Birth/Sex: 01-Jun-1923 (85 y.o. F) Treating RN: Harold Barban Primary Care Shoji Pertuit: Grayland Ormond Other Clinician: Referring Kinnley Paulson: Grayland Ormond Treating Gavina Dildine/Extender: Melburn Hake, HOYT Weeks in Treatment: 38 Active Inactive Abuse / Safety / Falls / Self Care Management Nursing Diagnoses: Potential for falls Goals: Patient will not experience any injury related to falls Date Initiated: 08/10/2017 Target Resolution Date: 11/10/2017 Goal Status: Active Interventions: Assess Activities of Daily Living upon admission and as needed Assess fall risk on admission and as needed Assess: immobility, friction, shearing, incontinence upon admission and as needed Notes: Nutrition Nursing Diagnoses: Imbalanced nutrition Potential for alteratiion in Nutrition/Potential for imbalanced nutrition Goals: Patient/caregiver agrees to and verbalizes understanding of need to use nutritional supplements and/or vitamins as prescribed Date Initiated: 08/10/2017 Target Resolution Date: 12/08/2017 Goal Status: Active Interventions: Assess patient nutrition upon admission and as needed per policy Notes: Orientation to the Wound Care Program Nursing Diagnoses: Knowledge deficit related to the wound healing center program Goals: Patient/caregiver will verbalize understanding of the [facility] Date Initiated: 08/10/2017 Target Resolution Date: 09/08/2017 Goal Status: Active [name], [name] ([id number]) Interventions:  Provide education on orientation to the wound center Notes: Pain, Acute or  Chronic Nursing Diagnoses: Pain, acute or chronic: actual or potential Potential alteration in comfort, pain Goals: Patient/caregiver will verbalize adequate pain control between visits Date Initiated: 08/10/2017 Target Resolution Date: 12/08/2017 Goal Status: Active Interventions: Complete pain assessment as per visit requirements Notes: Wound/Skin Impairment Nursing Diagnoses: Impaired tissue integrity Knowledge deficit related to ulceration/compromised skin integrity Goals: Ulcer/skin breakdown will have a volume reduction of 80% by week 12 Date Initiated: 08/10/2017 Target Resolution Date: 12/01/2017 Goal Status: Active Interventions: Assess patient/caregiver ability to perform ulcer/skin care regimen upon admission and as needed Notes: Electronic Signature(s) Signed: 01/28/2019 11:13:25 AM By: Harold Barban Entered By: Harold Barban on 01/24/2019 11:23:57 Bienkowski, [name] ([id number]) -------------------------------------------------------------------------------- Pain Assessment Details Patient Name: [name] Date of Service: 01/24/2019 10:30 AM Medical Record Number: [id number] Patient Account Number: [id number] Date of Birth/Sex: [birth year] (85 y.o. F) Treating RN: Montey Hora Primary Care Ahnaf Caponi: Grayland Ormond Other Clinician: Referring Semaya Vida: Grayland Ormond Treating Teagan Ozawa/Extender: Melburn Hake, HOYT Weeks in Treatment: 27 Active Problems Location of Pain Severity and Description of Pain Patient Has Paino No Site Locations Pain Management and Medication Current Pain Management: Electronic Signature(s) Signed: 01/24/2019 3:46:48 PM By: Paulla Fore, RRT, CHT Signed: 01/24/2019 4:37:43 PM By: Montey Hora Entered By: Lorine Bears on 01/24/2019 10:33:22 [name] ([id number]) -------------------------------------------------------------------------------- Patient/Caregiver Education  Details Patient Name: [name] Date of Service: 01/24/2019 10:30 AM Medical Record Number: [id number] Patient Account Number: [id number] Date of Birth/Gender: May 15, 1923 (85 y.o. F) Treating RN: Harold Barban Primary Care Physician: Grayland Ormond Other Clinician: Referring Physician: Grayland Ormond Treating Physician/Extender: Sharalyn Ink in Treatment: 50 Education Assessment Education Provided To: Patient Education Topics Provided Wound/Skin Impairment: Handouts: Caring for Your Ulcer Methods: Demonstration, Explain/Verbal Responses: State content correctly Electronic Signature(s) Signed: 01/28/2019 11:13:25 AM By: Harold Barban Entered By: Harold Barban on 01/24/2019 11:24:38 Mahajan, [name] ([id number]) -------------------------------------------------------------------------------- Wound Assessment Details Patient Name: [name] Date of Service: 01/24/2019 10:30 AM Medical Record Number: [id number] Patient Account Number: [id number] Date of Birth/Sex: 09-15-1923 (85 y.o. F) Treating RN: Army Melia Primary Care Dianey Suchy: Grayland Ormond Other Clinician: Referring Tresean Mattix: Grayland Ormond Treating Maicey Barrientez/Extender: STONE III, HOYT Weeks in Treatment: 76 Wound Status Wound Number: 2 Primary Pressure Ulcer Etiology: Wound Location: Left Malleolus - Lateral Secondary Arterial Insufficiency Ulcer Wounding Event: Gradually Appeared Etiology: Date Acquired: 04/12/2018 Wound Status: Open Weeks Of Treatment: 40 Comorbid Cataracts, Congestive Heart Failure, Clustered Wound: No History: Hypertension, Osteoarthritis, Neuropathy Photos Photo Uploaded By: Army Melia on 01/24/2019 15:42:17 Wound Measurements Length: (cm) 0.7 Width: (cm) 0.6 Depth: (cm) 0.2 Area: (cm) 0.33 Volume: (cm) 0.066 % Reduction in Area: 58% % Reduction in Volume: 16.5% Epithelialization: Small (1-33%) Tunneling: No Undermining: No Wound  Description Classification: Category/Stage IV Foul Odo Wound Margin: Epibole Slough/F Exudate Amount: Medium Exudate Type: Serous Exudate Color: amber r After Cleansing: No ibrino Yes Wound Bed Granulation Amount: Large (67-100%) Exposed Structure Granulation Quality: Pink Fascia Exposed: No Necrotic Amount: Small (1-33%) Fat Layer (Subcutaneous Tissue) Exposed: Yes Necrotic Quality: Adherent Slough Tendon Exposed: No Muscle Exposed: No Joint Exposed: No Bone Exposed: No Periwound Skin Texture Riffel, [name] ([id number]) Texture Color No Abnormalities Noted: No No Abnormalities Noted: No Callus: No Atrophie Blanche: No Crepitus: No Cyanosis: No Excoriation: No Ecchymosis: No Induration: No Erythema: Yes Rash: No Erythema Location: Circumferential Scarring: No Hemosiderin Staining: No Mottled: No Moisture Pallor: No No Abnormalities Noted: No Rubor: No  Dry / Scaly: No Maceration: Yes Temperature / Pain Temperature: No Abnormality Tenderness on Palpation: Yes Wound Preparation Ulcer Cleansing: Rinsed/Irrigated with Saline Topical Anesthetic Applied: Other: lidocaine 4%, Treatment Notes Wound #2 (Left, Lateral Malleolus) 1. Cleansed with: Clean wound with Normal Saline 4. Dressing Applied: Iodoform packing Gauze 5. Secondary Dressing Applied Bordered Foam Dressing Electronic Signature(s) Signed: 01/24/2019 4:09:31 PM By: Army Melia Entered By: Army Melia on 01/24/2019 10:43:20 Buckhalter, [name] ([id number]) -------------------------------------------------------------------------------- Wound Assessment Details Patient Name: [name] Date of Service: 01/24/2019 10:30 AM Medical Record Number: [id number] Patient Account Number: [id number] Date of Birth/Sex: 04/28/1923 (85 y.o. F) Treating RN: Army Melia Primary Care Naysha Sholl: Grayland Ormond Other Clinician: Referring Caoimhe Damron: Grayland Ormond Treating Kaylanie Capili/Extender: STONE III,  HOYT Weeks in Treatment: 76 Wound Status Wound Number: 3 Primary Pressure Ulcer Etiology: Wound Location: Left Calcaneus Wound Open Wounding Event: Gradually Appeared Status: Date Acquired: 10/04/2018 Comorbid Cataracts, Congestive Heart Failure, Weeks Of Treatment: 15 History: Hypertension, Osteoarthritis, Neuropathy Clustered Wound: No Photos Photo Uploaded By: Army Melia on 01/24/2019 15:43:53 Wound Measurements Length: (cm) 0.9 Width: (cm) 0.1 Depth: (cm) 0.1 Area: (cm) 0.071 Volume: (cm) 0.007 % Reduction in Area: 97.4% % Reduction in Volume: 98.7% Epithelialization: Small (1-33%) Tunneling: No Undermining: No Wound Description Classification: Category/Stage II Foul Odo Wound Margin: Indistinct, nonvisible Slough/F Exudate Amount: Medium Exudate Type: Serous Exudate Color: amber r After Cleansing: No ibrino Yes Wound Bed Granulation Amount: Medium (34-66%) Exposed Structure Granulation Quality: Pink Fascia Exposed: No Necrotic Amount: Medium (34-66%) Fat Layer (Subcutaneous Tissue) Exposed: Yes Necrotic Quality: Adherent Slough Tendon Exposed: No Muscle Exposed: No Joint Exposed: No Bone Exposed: No Periwound Skin Texture Dearmond, [name] ([id number]) Texture Color No Abnormalities Noted: No No Abnormalities Noted: No Callus: No Atrophie Blanche: No Crepitus: No Cyanosis: No Excoriation: No Ecchymosis: No Induration: No Erythema: No Rash: No Hemosiderin Staining: No Scarring: No Mottled: No Pallor: No Moisture Rubor: No No Abnormalities Noted: No Dry / Scaly: Yes Temperature / Pain Maceration: No Temperature: No Abnormality Tenderness on Palpation: Yes Wound Preparation Ulcer Cleansing: Rinsed/Irrigated with Saline Topical Anesthetic Applied: Other: lidocaine 4%, Treatment Notes Wound #3 (Left Calcaneus) 1. Cleansed with: Clean wound with Normal Saline Notes Silver collagen, BFD Electronic Signature(s) Signed: 01/24/2019  4:09:31 PM By: Army Melia Entered By: Army Melia on 01/24/2019 10:43:30 Gregson, [name] ([id number]) -------------------------------------------------------------------------------- Wound Assessment Details Patient Name: [name] Date of Service: 01/24/2019 10:30 AM Medical Record Number: [id number] Patient Account Number: [id number] Date of Birth/Sex: 05/09/1923 (85 y.o. F) Treating RN: Army Melia Primary Care Denica Web: Grayland Ormond Other Clinician: Referring Mitsuye Schrodt: Grayland Ormond Treating Miata Culbreth/Extender: STONE III, HOYT Weeks in Treatment: 76 Wound Status Wound Number: 5 Primary Pressure Ulcer Etiology: Wound Location: Right Toe Great - Distal Wound Open Wounding Event: Footwear Injury Status: Date Acquired: 12/03/2018 Comorbid Cataracts, Congestive Heart Failure, Weeks Of Treatment: 7 History: Hypertension, Osteoarthritis, Neuropathy Clustered Wound: No Photos Photo Uploaded By: Army Melia on 01/24/2019 15:43:54 Wound Measurements Length: (cm) 0.2 Width: (cm) 0.2 Depth: (cm) 0.1 Area: (cm) 0.031 Volume: (cm) 0.003 % Reduction in Area: 91.2% % Reduction in Volume: 91.4% Epithelialization: None Tunneling: No Undermining: No Wound Description Classification: Category/Stage II Foul Odo Wound Margin: Flat and Intact Slough/F Exudate Amount: Small Exudate Type: Serous Exudate Color: amber r After Cleansing: No ibrino Yes Wound Bed Granulation Amount: Small (1-33%) Exposed Structure Granulation Quality: Pink Fascia Exposed: No Necrotic Amount: Large (67-100%) Fat Layer (Subcutaneous Tissue) Exposed: Yes Necrotic Quality: Eschar, Adherent Slough Tendon Exposed: No Muscle  Exposed: No Joint Exposed: No Bone Exposed: No Periwound Skin Texture Crespo, [name] ([id number]) Texture Color No Abnormalities Noted: No No Abnormalities Noted: No Callus: No Atrophie Blanche: No Crepitus: No Cyanosis: No Excoriation: No Ecchymosis:  Yes Induration: No Erythema: No Rash: No Hemosiderin Staining: No Scarring: No Mottled: No Pallor: No Moisture Rubor: No No Abnormalities Noted: No Dry / Scaly: No Maceration: No Wound Preparation Ulcer Cleansing: Rinsed/Irrigated with Saline Topical Anesthetic Applied: Other: lidocaine 4%, Treatment Notes Wound #5 (Right, Distal Toe Great) 1. Cleansed with: Clean wound with Normal Saline Notes Silver collagen, BFD Electronic Signature(s) Signed: 01/24/2019 4:09:31 PM By: Army Melia Entered By: Army Melia on 01/24/2019 10:43:40 Weinfeld, [name] ([id number]) -------------------------------------------------------------------------------- Vitals Details Patient Name: [name] Date of Service: 01/24/2019 10:30 AM Medical Record Number: [id number] Patient Account Number: [id number] Date of Birth/Sex: 07/07/1923 (85 y.o. F) Treating RN: Montey Hora Primary Care Grisell Bissette: Grayland Ormond Other Clinician: Referring Tauheedah Bok: Grayland Ormond Treating Lakeva Hollon/Extender: Melburn Hake, HOYT Weeks in Treatment: 71 Vital Signs Time Taken: 10:33 Temperature (F): 98.2 Height (in): 65 Pulse (bpm): 76 Weight (lbs): 154.3 Respiratory Rate (breaths/min): 16 Body Mass Index (BMI): 25.7 Blood Pressure (mmHg): 117/46 Reference Range: 80 - 120 mg / dl Electronic Signature(s) Signed: 01/24/2019 3:46:48 PM By: Lorine Bears RCP, RRT, CHT Entered By: Lorine Bears on 01/24/2019 10:36:56

## 2019-01-28 NOTE — Progress Notes (Signed)
[name], [name] ([id number]) Visit Report for 01/24/2019 Chief Complaint Document Details Patient Name: [name], [name]. Date of Service: 01/24/2019 10:30 AM Medical Record Number: [id number] Patient Account Number: [id number] Date of Birth/Sex: December 02, 1923 (85 y.o. F) Treating RN: Montey Hora Primary Care Provider: Grayland Ormond Other Clinician: Referring Provider: Grayland Ormond Treating Provider/Extender: Melburn Hake, HOYT Weeks in Treatment: 68 Information Obtained from: Patient Chief Complaint Patient is here for right lateral malleolus, right 2nd distal toe and left lateral malleolus ulcer Electronic Signature(s) Signed: 01/24/2019 4:27:09 PM By: Worthy Keeler PA-C Entered By: Worthy Keeler on 01/24/2019 10:44:43 [name], [name] ([id number]) -------------------------------------------------------------------------------- Debridement Details Patient Name: [name] Date of Service: 01/24/2019 10:30 AM Medical Record Number: [id number] Patient Account Number: [id number] Date of Birth/Sex: 1923/07/02 (85 y.o. F) Treating RN: Harold Barban Primary Care Provider: Grayland Ormond Other Clinician: Referring Provider: Grayland Ormond Treating Provider/Extender: Melburn Hake, HOYT Weeks in Treatment: 76 Debridement Performed for Wound #5 Right,Distal Toe Great Assessment: Performed By: Physician STONE III, HOYT E., PA-C Debridement Type: Debridement Level of Consciousness (Pre- Awake and Alert procedure): Pre-procedure Verification/Time Yes - 11:28 Out Taken: Start Time: 11:28 Pain Control: Lidocaine Total Area Debrided (L x W): 0.2 (cm) x 0.2 (cm) = 0.04 (cm) Tissue and other material Viable, Non-Viable, Callus, Slough, Subcutaneous, Slough debrided: Level: Skin/Subcutaneous Tissue Debridement Description: Excisional Instrument: Curette Bleeding: None End Time: 11:30 Procedural Pain: 0 Post Procedural Pain: 0 Response to Treatment: Procedure was  tolerated well Level of Consciousness Awake and Alert (Post-procedure): Post Debridement Measurements of Total Wound Length: (cm) 0.2 Stage: Category/Stage II Width: (cm) 0.2 Depth: (cm) 0.1 Volume: (cm) 0.003 Character of Wound/Ulcer Post Improved Debridement: Post Procedure Diagnosis Same as Pre-procedure Electronic Signature(s) Signed: 01/24/2019 4:27:09 PM By: Worthy Keeler PA-C Signed: 01/28/2019 11:13:25 AM By: Harold Barban Entered By: Harold Barban on 01/24/2019 11:29:41 [name], [name] ([id number]) -------------------------------------------------------------------------------- HPI Details Patient Name: [name] Date of Service: 01/24/2019 10:30 AM Medical Record Number: [id number] Patient Account Number: [id number] Date of Birth/Sex: 1923/07/13 (85 y.o. 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 Dr. Amalia Hailey the podiatrist saw her on 07/20/2017 and sent her to the wound center for possible hyperbaric oxygen therapy. past medical history of peripheral vascular disease, varicose veins, status post appendectomy, basal cell carcinoma excision from the left leg, cholecystectomy, pacemaker placement, right lower extremity angiography done by Dr. dew in March 2017 with placement of a stent. there is also note of a successful ablation of the right small saphenous vein done which was reviewed by ultrasound on 10/24/2016. the patient had a right small saphenous vein ablation done on 10/20/2016. The patient has never been a smoker. She has been seen by Dr. Corene Cornea dew the vascular  surgeon who most recently saw her on 06/15/2017 for evaluation of ongoing problems with right leg swelling. She had a lower extremity arterial duplex examination done(02/13/17) which showed patent distal right superficial femoral artery stent and above-the-knee popliteal stent without evidence of restenosis. The ABI was more than 1.3 on the right and more than 1.3 on the left. This was consistent with noncompressible arteries due to medial calcification. The right great toe pressure and PPG waveforms  are within normal limits and the left great toe pressure and PPG waveforms are decreased. he recommended she continue to wear her compression stockings and continue with elevation. She is scheduled to have a noninvasive arterial study in the near future 08/16/2017 -- had a lower extremity arterial duplex examination done which showed patent distal right superficial femoral artery stent and above-the-knee popliteal stent without evidence of restenosis. The ABI was more than 1.3 on the right and more than 1.3 on the left. This was consistent with noncompressible arteries due to medial calcification. The right great toe pressure and PPG waveforms are within normal limits and the left great toe pressure and PPG waveforms are decreased. the x-ray of the right ankle has not yet been done 08/24/2017 -- had a right ankle x-ray -- IMPRESSION:1. No fracture, bone lesion or evidence of osteomyelitis. 2. Lateral soft tissue swelling with a soft tissue ulcer. she has not yet seen the vascular surgeon for review 08/31/17 on evaluation today patient's wound appears to be showing signs of improvement. She still with her appointment with vascular in order to review her results of her vascular study and then determine if any intervention would be recommended at that time. No fevers, chills, nausea, or vomiting noted at this time. She has been tolerating the dressing changes without complication. 09/28/17 on evaluation  today patient's wound appears to show signs of good improvement in regard to the granulation tissue which is surfacing. There is still a layer of slough covering the wound and the posterior portion is still significantly deeper than the anterior nonetheless there has been some good sign of things moving towards the better. She is going to go back to Dr. dew for reevaluation to ensure her blood flow is still appropriate. That will be before her next evaluation with [place] next week. No fevers, chills, nausea, or vomiting noted at this time. Patient does have some discomfort rated to be a 3-4/10 depending on activity specifically cleansing the wound makes it worse. 10/05/2017 -- the patient was seen by Dr. Lucky Cowboy last week and noninvasive studies showed a normal right ABI with brisk triphasic waveforms consistent with no arterial insufficiency including normal digital pressures. The duplex showed a patent distal right SFA stent and the proximal SFA was also normal. He was pleased with her test and thought she should have enough of perfusion for normal wound healing. He would see her back in 6 months time. 12/21/17 on evaluation today patient appears to be doing fairly well in regard to her right lateral ankle wound. Unfortunately the main issue that she is expansion at this point is that she is having some issues with what appears to be some cellulitis in the DANAKA, LLERA. ([id number]) right anterior shin. She has also been noting a little bit of uncomfortable feeling especially last night and her ankle area. I'm afraid that she made the developing a little bit of an infection. With that being said I think it is in the early stages. 12/28/17 on evaluation today patient's ankle appears to be doing excellent. She's making good progress at this point the cellulitis seems to have improved after last week's evaluation. Overall she is having no significant discomfort which is excellent news. She does have an  appointment with Dr. dew on March 29, 2018 for reevaluation in regard to the stent he placed. She seems to have excellent blood flow in the right lower extremity. 01/19/12 on evaluation today patient's wound appears to be doing very well. In  fact she does not appear to require debridement at this point, there's no evidence of infection, and overall from the standpoint of the wound she seems to be doing very well. With that being said I believe that it may be time to switch to different dressing away from the [facility] Dressing she tells me she does have a lot going on her friend actually passed away yesterday and she's also having a lot of issues with her husband this obviously is weighing heavy on her as far as your thoughts and concerns today. 01/25/18 on evaluation today patient appears to be doing fairly well in regard to her right lateral malleolus. She has been tolerating the dressing changes without complication. Overall I feel like this is definitely showing signs of improvement as far as how the overall appearance of the wound is there's also evidence of epithelium start to migrate over the granulation tissue. In general I think that she is progressing nicely as far as the wound is concerned. The only concern she really has is whether or not we can switch to every other week visits in order to avoid having as many appointments as her daughters have a difficult time getting her to her appointments as well as the patient's husband to his he is not doing very well at this point. 02/22/18 on evaluation today patient's right lateral malleolus ulcer appears to be doing great. She has been tolerating the dressing changes without complication. Overall you making excellent progress at this time. Patient is having no significant discomfort. 03/15/18 on evaluation today patient appears to be doing much more poorly in regard to her right lateral ankle ulcer at this point. Unfortunately since have last  seen her her husband has passed just a few days ago is obviously weighed heavily on her her daughter also had surgery well she is with her today as usual. There does not appear to be any evidence of infection she does seem to have significant contusion/deep tissue injury to the right lateral malleolus which was not noted previous when I saw her last. It's hard to tell of exactly when this injury occurred although during the time she was spending the night in the hospital this may have been most likely. 03/22/18 on evaluation today patient appears to actually be doing very well in regard to her ulcer. She did unfortunately have a setback which was noted last week however the good news is we seem to be getting back on track and in fact the wound in the core did still have some necrotic tissue which will be addressed at this point today but in general I'm seeing signs that things are on the up and up. She is glad to hear this obviously she's been somewhat concerned that due to the how her wound digressed more recently. 03/29/18 on evaluation today patient appears to be doing fairly well in regard to her right lower extremity lateral malleolus ulcer. She unfortunately does have a new area of pressure injury over the inferior portion where the wound has opened up a little bit larger secondary to the pressure she seems to be getting. She does tell me sometimes when she sleeps at night that it actually hurts and does seem to be pushing on the area little bit more unfortunately. There does not appear to be any evidence of infection which is good news. She has been tolerating the dressing changes without complication. She also did have some bruising in the left second and third toes due  to the fact that she may have bump this or injured it although she has neuropathy so she does not feel she did move recently that may have been where this came from. Nonetheless there does not appear to be any evidence of  infection at this time. 04/12/18 on evaluation today patient's wound on the right lateral ankle actually appears to be doing a little bit better with a lot of necrotic docking tissue centrally loosening up in clearing away. However she does have the beginnings of a deep tissue injury on the left lateral malleolus likely due to the fact we've been trying offload the right as much as we have. I think she may benefit from an assistive soft device to help with offloading and it looks like they're looking at one of the doughnut conditions that wraps around the lower leg to offload which I think will definitely do a good job. With that being said I think we definitely need to address this issue on the left before it becomes a wound. Patient is not having significant pain. 04/19/18 on evaluation today patient appears to be doing excellent in regard to the progress she's made with her right lateral ankle ulcer. The left ankle region which did show evidence of a deep tissue injury seems to be resolving there's little fluid noted underneath and a blister there's nothing open at this point in time overall I feel like this is progressing nicely which is good news. She does not seem to be having significant discomfort at this point which is also good news. 04/25/18-She is here in follow up evaluation for bilateral lateral malleolar ulcers. The right lateral malleolus ulcer with pale subcutaneous tissue exposure, central area of ulcer with tendon/periosteum exposed. The left lateral malleolus ulcer now with Goers, [name] ([id number]) central area of nonviable tissue, otherwise deep tissue injury. She is wearing compression wraps to the left lower extremity, she will place the right lower extremity compression wraps on when she gets home. She will be out of town over the weekend and return next week and follow-up appointment. She completed her doxycycline this morning 05/03/18 on evaluation today patient appears  to be doing very well in regard to her right lateral ankle ulcer in general. At least she's showing some signs of improvement in this regard. Unfortunately she has some additional injury to the left lateral malleolus region which appears to be new likely even over the past several days. Again this determination is based on the overall appearance. With that being said the patient is obviously frustrated about this currently. 05/10/18-She is here in follow-up evaluation for bilateral lateral malleolar ulcers. She states she has purchased offloading shoes/boots and they will arrive tomorrow. She was asked to bring them in the office at next week's appointment so her provider is aware of product being utilized. She continues to sleep on right or left side, she has been encouraged to sleep on her back. The right lateral malleolus ulcer is precariously close to peri-osteum; will order xray. The left lateral malleolus ulcer is improved. Will switch back to santyl; she will follow up next week. 05/17/18 on evaluation today patient actually appears to be doing very well in regard to her malleolus her ulcers compared to last time I saw them. She does not seem to have as much in the way of contusion at this point which is great news. With that being said she does continue to have discomfort and I do believe that she is still continuing  to benefit from the offloading/pressure reducing boots that were recommended. I think this is the key to trying to get this to heal up completely. 05/24/18 on evaluation today patient actually appears to be doing worse at this point in time unfortunately compared to her last week's evaluation. She is having really no increased pain which is good news unfortunately she does have more maceration in your theme and noted surrounding the right lateral ankle the left lateral ankle is not really is erythematous I do not see signs of the overt cellulitis on that side. Unfortunately the  wounds do not seem to have shown any signs of improvement since the last evaluation. She also has significant swelling especially on the right compared to previous some of this may be due to infection however also think that she may be served better while she has these wounds by compression wrapping versus continuing to use the Juxta-Lite for the time being. Especially with the amount of drainage that she is experiencing at this point. No fevers, chills, nausea, or vomiting noted at this time. 05/31/18 on evaluation today patient appears to actually be doing better in regard to her right lateral lower extremity ulcer specifically on the malleolus region. She has been tolerating the antibiotic without complication. With that being said she still continues to have issues but a little bit of redness although nothing like she what she was experiencing previous. She still continues to pressure to her ankle area she did get the problem on offloading boots unfortunately she will not wear them she states there too uncomfortable and she can't get in and out of the bed. Nonetheless at this point her wounds seem to be continually getting worse which is not what we want I'm getting somewhat concerned about her progress and how things are going to proceed if we do not intervene in some way shape or form. I therefore had a very lengthy conversation today about offloading yet again and even made a specific suggestion for switching her to a memory foam mattress and even gave the information for a specific one that they could look at getting if it was something that they were interested in considering. She does not want to be considered for a hospital bed air mattress although honestly insurance would not cover it that she does not have any wounds on her trunk. 06/14/18 on evaluation today both wounds over the bilateral lateral malleolus her ulcers appear to be doing better there's no evidence of pressure injury at  this point. She did get the foam mattress for her bed and this does seem to have been extremely beneficial for her in my pinion. Her daughter states that she is having difficulty getting out of bed because of how soft it is. The patient also relates this to be. Nonetheless I do feel like she's actually doing better. Unfortunately right after and around the time she was getting the mattress she also sustained a fall when she got up to go pick up the phone and ended up injuring her right elbow she has 18 sutures in place. We are not caring for this currently although home health is going to be taking the sutures out shortly. Nonetheless this may be something that we need to evaluate going forward. It depends on how well it has or has not healed in the end. She also recently saw an orthopedic specialist for an injection in the right shoulder just before her fall unfortunately the fall seems to have worsened her  pain. 06/21/18 on evaluation today patient appears to be doing about the same in regard to her lateral malleolus ulcers. Both appear to be just a little bit deeper but again we are clinging away the necrotic and dead tissue which I think is why this is progressing towards a deeper realm as opposed to improving from my measurement standpoint in that regard. Nonetheless she has been tolerating the dressing changes she absolutely hates the memory foam mattress topper that was obtained for her nonetheless I do believe this is still doing excellent as far as taking care of excess pressure in regard to the lateral malleolus regions. She in fact has no pressure injury that I see whereas in weeks past it was week by week I was constantly seeing new pressure injuries. Overall I think it has been very beneficial for her. 07/03/18; patient arrives in my clinic today. She has deep punched out areas over her bilateral lateral malleoli. The area on the right has some more depth. MEGHEN, AKOPYAN  ([id number]) We spent a lot of time today talking about pressure relief for these areas. This started when her daughter asked for a prescription for a memory foam mattress. I have never written a prescription for a mattress and I don't think insurances would pay for that on an ordinary bed. In any case he came up that she has foam boots that she refuses to wear. I would suggest going to these before any other offloading issues when she is in bed. They say she is meticulous about offloading this the rest of the day 07/10/18- She is seen in follow-up evaluation for bilateral, lateral malleolus ulcers. There is no improvement in the ulcers. She has purchased and is sleeping on a memory foam mattress/overlay, she has been using the offloading boots nightly over the past week. She has a follow up appointment with vascular medicine at the end of October, in my opinion this follow up should be expedited given her deterioration and suboptimal TBI results. We will order plain film xray of the left ankle as deeper structures are palpable; would consider having MRI, regardless of xray report(s). The ulcers will be treated with iodoflex/iodosorb, she is unable to safely change the dressings daily with santyl. 07/19/18 on evaluation today patient appears to be doing in general visually well in regard to her bilateral lateral malleolus ulcers. She has been tolerating the dressing changes without complication which is good news. With that being said we did have an x-ray performed on 07/12/18 which revealed a slight loosen see in the lateral portion of the distal left fibula which may represent artifact but underline lytic destruction or osteomyelitis could not be excluded. MRI was recommended. With that being said we can see about getting the patient scheduled for an MRI to further evaluate this area. In fact we have that scheduled currently for August 20 19,019. 07/26/18 on evaluation today patient's wound on the right  lateral ankle actually appears to be doing fairly well at this point in my pinion. She has made some good progress currently. With that being said unfortunately in regard to the left lateral ankle ulcer this seems to be a little bit more problematic at this time. In fact as I further evaluated the situation she actually had bone exposed which is the first time that's been the case in the bone appear to be necrotic. Currently I did review patient's note from Dr. Bunnie Domino office with [facility]. He stated that ABI was  1.26 on the right and 0.95 on the left with good waveforms. Her perfusion is stable not reduced from previous studies and her digital waveforms were pretty good particularly on the right. His conclusion upon review of the note was that there was not much she could do to improve her perfusion and he felt she was adequate for wound healing. His suggestion was that she continued to see [place] and consider a synthetic skin graft if there was no underlying infection. He plans to see her back in six months or as needed. 08/01/18 on evaluation today patient appears to be doing better in regard to her right lateral ankle ulcer. Her left lateral ankle ulcer is about the same she still has bone involvement in evidence of necrosis. There does not appear to be evidence of infection at this time On the right lateral lower extremity. I have started her on the Augmentin she picked this up and started this yesterday. This is to get her through until she sees infectious disease which is scheduled for 08/12/18. 08/06/18 on evaluation today patient appears to be doing rather well considering my discussion with patient's daughter at the end of last week. The area which was marked where she had erythema seems to be improved and this is good news. With that being said overall the patient seems to be making good improvement when it comes to the overall appearance of the right lateral ankle ulcer  although this has been slow she at least is coming around in this regard. Unfortunately in regard to the left lateral ankle ulcer this is osteomyelitis based on the pathology report as well is bone culture. Nonetheless we are still waiting CT scan. Unfortunately the MRI we originally ordered cannot be performed as the patient is a pacemaker which I had overlooked. Nonetheless we are working on the CT scan approval and scheduling as of now. She did go to the hospital over the weekend and was placed on IV Cefzo for a couple of days. Fortunately this seems to have improved the erythema quite significantly which is good news. There does not appear to be any evidence of worsening infection at this time. She did have some bleeding after the last debridement therefore I did not perform any sharp debridement in regard to left lateral ankle at this point. Patient has been approved for a snap vac for the right lateral ankle. 08/14/18; the patient with wounds over her bilateral lateral malleoli. The area on the right actually looks quite good. Been using a snap back on this area. Healthy granulation and appears to be filling in. Unfortunately the area on the left is really problematic. 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 Also noted to have cellulitis. She saw Dr. Novella Olive of infectious disease today and was put on linezolid. We are able to verify this with her pharmacy. She is completed the Augmentin that she was already on. We've been using Iodoflex to this area 08/23/18 on evaluation today patient's wounds both actually appear to be doing better compared to my prior evaluations. Fortunately she showing signs of good improvement in regard to the overall wound status especially where were using the snap vac on the right. In regard to left lateral malleolus the wound bed actually appears to be much cleaner than  previously noted. I do not feel any phone directly probed during evaluation today and though there is tendon noted this does not  appear TANICE, PETRE ([id number]) to be necrotic it's actually fairly good as far as the overall appearance of the tendon is concerned. In general the wound bed actually appears to be doing significantly better than it was previous. Patient is currently in the care of [name] and I did review that note today. He actually has her on two weeks of linezolid and then following the patient will be on 1-2 months of Keflex. That is the plan currently. She has been on antibiotics therapy as prescribed by myself initially starting on July 30, 2018 and has been on that continuously up to this point. 08/30/18 on evaluation today patient actually appears to be doing much better in regard to her right lateral malleolus ulcer. She has been tolerating the dressing changes specifically the snap vac without complication although she did have some issues with the seal currently. Apparently there was some trouble with getting it to maintain over the past week past Sunday. Nonetheless overall the wound appears better in regard to the right lateral malleolus region. In regard to left lateral malleolus this actually show some signs of additional granulation although there still tendon noted in the base of the wound this appears to be healthy not necrotic in any way whatsoever. We are considering potentially using a snap vac for the left lateral malleolus as well the product wrap from [employer], Zachary, was present in the clinic today we're going to see this patient I did have her come in with me after obtaining consent from the patient and her daughter in order to look at the wound and see if there's any recommendation one way or another as to whether or not they felt the snapback could be beneficial for the left lateral malleolus region. But the conclusion was that it might be but that this  is definitely a little bit deeper wound than what traditionally would be utilized for a snap vac. 09/06/18 on evaluation today patient actually appears to be doing excellent in my pinion in regard to both ankle ulcers. She has been tolerating the dressing changes without complication which is great news. Specifically we have been using the snap vac. In regard to the right ankle I'm not even sure that this is going to be necessary for today and following as the wound has filled in quite nicely. In regard to the left ankle I do believe that we're seeing excellent epithelialization from the edge as well as granulation in the central portion the tendon is still exposed but there's no evidence of necrotic bone and in general I feel like the patient has made excellent progress even compared to last week with just one week of the snap vac. 09/11/18; this is a patient who has wounds on her bilateral lateral malleoli. Initially both of these were deep stage IV wounds in the setting of chronic arterial insufficiency. She has been revascularized. As I understand think she been using snap vacs to both of these wounds however the area on the right became more superficial and currently she is only using it on the left. Using silver collagen on the right and silver collagen under the back on the left I believe 09/19/18 on evaluation today patient actually appears to be doing very well in regard to her lateral malleolus or ulcers bilaterally. She has been tolerating the dressing changes without complication. Fortunately there does not appear to be any evidence of infection at this time. Overall I feel like she is improving in an excellent manner  and I'm very pleased with the fact that everything seems to be turning towards the better for her. This has obviously been a long road. 09/27/18 on evaluation today patient actually appears to be doing very well in regard to her bilateral lateral malleolus ulcers. She has  been tolerating the dressing changes without complication. Fortunately there does not appear to be any evidence of infection at this time which is also great news. No fevers, chills, nausea, or vomiting noted at this time. Overall I feel like she is doing excellent with the snap vac on the left malleolus. She had 40 mL of fluid collection over the past week. 10/04/18 on evaluation today patient actually appears to be doing well in regard to her bilateral lateral malleolus ulcers. She continues to tolerate the dressing changes without complication. One issue that I see is the snap vac on the left lateral malleolus which appears to have sealed off some fluid underlying this area and has not really allowed it to heal to the degree that I would like to see. For that reason I did suggest at this point we may want to pack a small piece of packing strip into this region to allow it to more effectively wick out fluid. 10/11/18 in general the patient today does not feel that she has been doing very well. She's been a little bit lethargic and subsequently is having bodyaches as well according to what she tells me today. With that being said overall she has been concerned with the fact that something may be worsening although to be honest her wounds really have not been appearing poorly. She does have a new ulcer on her left heel unfortunately. This may be pressure related. Nonetheless it seems to me to have potentially started at least as a blister I do not see any evidence of deep tissue injury. In regard to the left ankle the snap vac still seems to be causing the ceiling off of the deeper part of the wound which is in turn trapping fluid. I'm not extremely pleased with the overall appearance as far as progress from last week to this week therefore I'm gonna discontinue the snap vac at this point. 10/18/18 patient unfortunately this point has not been feeling well for the past several days. She was seen by  Grayland Ormond her primary care provider who is a Librarian, academic at [facility]. Subsequently she states that she's been very weak and generally feeling malaise. No fevers, chills, nausea, or vomiting noted at this time. With that being said bloodwork was performed at the PCP office on the 11th of this month which showed a white blood cell count of 10.7. This was repeated today Burgueno, [name] ([id number]) and shows a white blood cell count of 12.4. This does show signs of worsening. Coupled with the fact that she is feeling worse and that her left ankle wound is not really showing signs of improvement I feel like this is an indication that the osteomyelitis is likely exacerbating not improving. Overall I think we may also want to check her C-reactive protein and sedimentation rate. Actually did call [facility] office this afternoon while the patient was in the office here with me. Subsequently based on the findings we discussed treatment possibilities and I think that it is appropriate for [place] to go ahead and initiate treatment with doxycycline which I'm going to do. Subsequently he did agree to see about adding a CRP and sedimentation rate to her orders. If that  has not already been drawn to where they can run it they will contact the patient she can come back to have that check. They are in agreement with plan as far as the patient and her daughter are concerned. Nonetheless also think we need to get in touch with Dr. Henreitta Leber office to see about getting the patient scheduled with him as soon as possible. 11/08/18 on evaluation today patient presents for follow-up concerning her bilateral foot and ankle ulcers. I did do an extensive review of her chart in epic today. Subsequently she was seen by [name] he did initiate Cefepime IV antibiotic therapy. Subsequently she had some issues with her PICC line this had to be removed because it was coiled and then replaced. Fortunately  that was now settled. Unfortunately she has continued have issues with her left heel as well as the issues that she is experiencing with her bilateral lateral malleolus regions. I do believe however both areas seem to be doing a little bit better on evaluation today which is good news. No fevers, chills, nausea, or vomiting noted at this time. She actually has an angiogram schedule with Dr. dew on this coming Monday, November 11, 2018. Subsequently the patient states that she is feeling much better especially than what she was roughly 2 weeks ago. She actually had to cancel an appointment because she was feeling so poorly. No fevers, chills, nausea, or vomiting noted at this time. 11/15/18 on evaluation today patient actually is status post having had her angiogram with Dr. dew Monday, four days ago. 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 He had to go and work on several areas of the vasculature fortunately he was able to obtain no more than a 30% residual stenosis throughout post procedure. I reviewed this note today. I think this will definitely help with healing at this time. Fortunately there does not appear to be any signs of infection and I do feel like ratio already has a better appearance to it. 11/22/18 upon evaluation today patient actually appears to be doing very well in regard to her wounds in general. The right lateral malleolus looks excellent the heel looks better in the left lateral malleolus also appears to be doing a little better. With that being said the right second toe actually appears to be open and training we been watching this is been dry and stable but now is open. 12/03/2018 Seen today for follow-up and management of multiple bilateral lower extremity wounds. New pressure injury of the great toe which is closed at this time. Wound of the right distal second toe appears larger today with deep undermining and a pocket of fluid present within  the undermining region. Left and right malleolus is wounds are stable today with no signs and symptoms of infection.Denies any needs or concerns during exam today. 12/13/18 on evaluation today patient appears to be doing somewhat better in regard to her left heel ulcer. She also seems to be completely healed in regard to the right lateral malleolus ulcer. The left malleolus ulcer is smaller what unfortunately the wounds which are new over the first and second toes of the right foot are what are most concerning at this point especially the second. Both areas did require sharp debridement today. 12/20/18 on evaluation today patient's wound actually appears to be doing better in regard to left lateral ankle and her right lateral ankle continues to remain healed. The hill ulcer on the left  is improved. She does have improvement noted as well in regard to both toe ulcers. Overall I'm very pleased in this regard. No fevers, chills, nausea, or vomiting noted at this time. 12/23/18 on evaluation today patient is seen after she had her toenails trimmed at the podiatrist office due to issues with her right great toe. There was what appeared to be dark eschar on the surface of the wound which had her in the podiatrist concerned. Nonetheless as I remember that during the last office visit I had utilize silver nitrate of this area I was much less concerned about the situation. Subsequently I was able to clean off much of this tissue without any complication today. This does not appear to show any signs of infection and actually look somewhat better compared to last time post debridement. Her second toe on the right foot actually had callous over and there did appear still be some fluid underneath this that would require debridement today. 12/27/18 on evaluation today patient actually appears to be showing signs of improvement at all locations. Even the left lateral ankle although this is not quite as great as the  other sites. Fortunately there does not appear to be any signs of infection at this time and both of her toes on the right foot seem to be showing signs of improvement which is good news and very pleased in this regard. 01/03/19 on evaluation today patient appears to be doing better for the most part in regard to her wounds in particular. There [name], [name] ([id number]) does not appear to be any evidence of infection at this time which is good news. Fortunately there is no sign of really worsening anywhere except for the right great toe which she does have what appears to be a bruise/deep tissue injury which is very superficial and already resolving. I'm not sure where this came from I questioned her extensively and she does not recall what may have happened with this. Other than that the patient seems to be doing well even the left lateral ankle ulcer looks good and is getting smaller. 01/10/19 on evaluation today patient appears to be doing well in regard to her left heel wound and both of her toe wounds. Overall I feel like there is definitely improvement here and I'm happy in that regard. With that being said unfortunately she is having issues with the left lateral malleolus ulcer which unfortunately still has a lot of depth to it. This is gonna be a very difficult wound for [place] to be able to truly get to heal. I may want to consider some type of skin substitute to see if this would be of benefit for her. I'll discuss this with her more the next visit most likely. This was something I thought about more at the end of the visit when I was Artie out of the room and the patient had been discharged. 01/17/19 on evaluation today patient appears to be doing very well in regard to her wounds in general. She's been making excellent progress at this time. Fortunately there's no sign of infection at this time either. No fevers, chills, nausea, or vomiting noted at this time. The biggest issue is still  her left lateral malleolus where it appears to be doing well and is getting smaller but still shows a small corner where this is deeper and goes down into what appears to be the joint space. Nonetheless this is taking much longer to heal although it still looks better in  smaller than previous evaluations. 01/24/19 on evaluation today patient's wounds actually appear to be doing rather well in general overall. She did require some sharp debridement in regard to the right great toe but everything else appears to be doing excellent no debridement was even necessary. No fevers, chills, nausea, or vomiting noted at this time. Electronic Signature(s) Signed: 01/24/2019 4:27:09 PM By: Worthy Keeler PA-C Entered By: Worthy Keeler on 01/24/2019 11:34:30 Minotti, [name] -------------------------------------------------------------------------------- Physical Exam Details Patient Name: [name] Date of Service: 01/24/2019 10:30 AM Medical Record Number: [id number] Patient Account Number: [id number] Date of Birth/Sex: 05-04-1923 (85 y.o. F) Treating RN: Montey Hora Primary Care Provider: Grayland Ormond Other Clinician: Referring Provider: Grayland Ormond Treating Provider/Extender: STONE III, HOYT Weeks in Treatment: 19 Constitutional Well-nourished and well-hydrated in no acute distress. Respiratory normal breathing without difficulty. Psychiatric this patient is able to make decisions and demonstrates good insight into disease process. Alert and Oriented x 3. pleasant and cooperative. Notes Patient's wound bed currently shows signs of good granulation at this time and all locations other than the right great toe where there was some [facility] that require sharp debridement this was performed without complication post debridement the wound bed appears to be doing much better. Electronic Signature(s) Signed: 01/24/2019 4:27:09 PM By: Worthy Keeler PA-C Entered By:  Worthy Keeler on 01/24/2019 11:34:53 [name], [name] ([id number]) -------------------------------------------------------------------------------- Physician Orders Details Patient Name: [name] Date of Service: 01/24/2019 10:30 AM Medical Record Number: [id number] Patient Account Number: [id number] Date of Birth/Sex: 1923/09/10 (85 y.o. F) Treating RN: Harold Barban Primary Care Provider: Grayland Ormond Other Clinician: Referring Provider: Grayland Ormond Treating Provider/Extender: Melburn Hake, HOYT Weeks in Treatment: 27 Verbal / Phone Orders: No Diagnosis Coding ICD-10 Coding Code Description 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, stage 4 L89.524 Pressure ulcer of left ankle, stage 4 L89.623 Pressure ulcer of left heel, stage 3 L89.892 Pressure ulcer of other site, stage 2 I70.233 Atherosclerosis of native arteries of right leg with ulceration of ankle I70.243 Atherosclerosis of native arteries of left leg with ulceration of ankle I89.0 Lymphedema, not elsewhere classified B35.4 Tinea corporis 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 o Cleanse wound with mild soap and water o May Shower, gently pat wound dry prior to applying new dressing. Wound #3 Left Calcaneus o Clean wound with Normal Saline. o Cleanse wound with mild soap and water o May Shower, gently pat wound dry prior to applying new dressing. Wound #5 Right,Distal Toe Great o Clean wound with Normal Saline. o Cleanse wound with mild soap and water o May Shower, gently pat wound dry prior to applying new dressing. Anesthetic (add to Medication List) Wound #2 Left,Lateral Malleolus o Topical Lidocaine 4% cream applied to wound bed prior to debridement (In Clinic Only). Wound #3 Left Calcaneus o Topical Lidocaine 4% cream applied to wound bed prior to debridement (In Clinic  Only). Wound #5 Right,Distal Toe Great o Topical Lidocaine 4% cream applied to wound bed prior to debridement (In Clinic Only). Skin Barriers/Peri-Wound Care Wound #5 Right,Distal Toe Great o Other: - Mupiricin [name], [name] ([id number]) Primary Wound Dressing Wound #2 Left,Lateral Malleolus o Silver Collagen - small piece of silver collagen to tunnel with iodoform packing over o Iodoform packing Gauze Wound #3 Left Calcaneus o Silver Collagen Wound #5 Right,Distal Toe Great o Silver Collagen Secondary Dressing Wound #2 Left,Lateral Malleolus o Boardered Foam  Dressing - Please keep a bordered foam dressing on the right malleolus with bactroban Wound #3 Left Calcaneus o Boardered Foam Dressing - Please keep a bordered foam dressing on the right malleolus with bactroban Wound #5 Right,Distal Toe Great o Other - Coverlet or bandaid Dressing Change Frequency Wound #2 Left,Lateral Malleolus o Change Dressing Monday, Wednesday, Friday Wound #3 Left Calcaneus o Change Dressing Monday, Wednesday, Friday Wound #5 Right,Distal Toe Great o Change Dressing Monday, Wednesday, Friday Follow-up Appointments Wound #2 Left,Lateral Malleolus o Return Appointment in 1 week. Wound #3 Left Calcaneus o Return Appointment in 1 week. Wound #5 Right,Distal Toe Great o Return Appointment in 1 week. Edema Control Wound #2 Left,Lateral Malleolus o Patient to wear own compression stockings o Patient to wear own Velcro compression garment. o Elevate legs to the level of the heart and pump ankles as often as possible Wound #3 Left Calcaneus o Patient to wear own compression stockings o Patient to wear own Velcro compression garment. o Elevate legs to the level of the heart and pump ankles as often as possible Off-Loading Tsosie, [name] ([id number]) Wound #2 Left,Lateral Malleolus o Turn and reposition every 2 hours o Other: - do not put  pressure on your ankles Wound #3 Left Calcaneus o Turn and reposition every 2 hours o Other: - do not put pressure on your ankles Wound #5 Right,Distal Toe Great o Turn and reposition every 2 hours o Other: - do not put pressure on your ankles Additional Orders / Instructions Wound #2 Left,Lateral Malleolus o Increase protein intake. o Increase protein intake. Wound #3 Left Calcaneus o Increase protein intake. o Increase protein intake. Wound #5 Right,Distal Toe Great o Increase protein intake. o Increase protein intake. Home Health Wound #2 [place] Visits - [facility]. Please visit patient Monday, Wednesday, and Friday o Home Health Nurse may visit PRN to address patientos wound care needs. o FACE TO FACE ENCOUNTER: MEDICARE and MEDICAID PATIENTS: I certify that this patient is under my care and that I had a face-to-face encounter that meets the physician face-to-face encounter requirements with this patient on this date. The encounter with the patient was in whole or in part for the following MEDICAL CONDITION: (primary reason for [place]) MEDICAL NECESSITY: I certify, that based on my findings, NURSING services are a medically necessary home health service. HOME BOUND STATUS: I certify that my clinical findings support that this patient is homebound (i.e., Due to illness or injury, pt requires aid of supportive devices such as crutches, cane, wheelchairs, walkers, the use of special transportation or the assistance of another person to leave their place of residence. There is a normal inability to leave the home and doing so requires considerable and taxing effort. Other absences are for medical reasons / religious services and are infrequent or of short duration when for other reasons). o If current dressing causes regression in wound condition, may D/C ordered dressing product/s and apply Normal Saline Moist  Dressing daily until next [place] / Other MD appointment. [place] of regression in wound condition at [phone]. o Please direct any NON-WOUND related issues/requests for orders to patient's Primary Care Physician Wound #3 Left [place] Visits - [facility]. Please visit patient Monday, Wednesday, and Friday o Home Health Nurse may visit PRN to address patientos wound care needs. o FACE TO FACE ENCOUNTER: MEDICARE and MEDICAID PATIENTS: I certify that this patient is under my care and that  I had a face-to-face encounter that meets the physician face-to-face encounter requirements with this patient on this date. The encounter with the patient was in whole or in part for the following MEDICAL CONDITION: (primary reason for [place]) MEDICAL NECESSITY: I certify, that based on my findings, NURSING services are a medically necessary home health service. HOME BOUND STATUS: I certify that my clinical findings support that this patient is homebound (i.e., Due to illness or injury, pt requires aid of supportive devices such as crutches, cane, wheelchairs, walkers, the use of special transportation or the assistance of another person to leave their place of residence. There is a normal inability to leave the home and doing so requires considerable and taxing effort. Other absences are for medical reasons / religious services and are infrequent or of short duration when for other reasons). LOUANA, FONTENOT ([id number]) o If current dressing causes regression in wound condition, may D/C ordered dressing product/s and apply Normal Saline Moist Dressing daily until next [place] / Other MD appointment. [place] of regression in wound condition at [phone]. o Please direct any NON-WOUND related issues/requests for orders to patient's Primary Care Physician Wound #5 Right,Distal Toe [place] [facility]. Please visit patient Monday, Wednesday, and Friday o Home Health Nurse may visit PRN to address patientos wound care needs. o FACE TO FACE ENCOUNTER: MEDICARE and MEDICAID PATIENTS: I certify that this patient is under my care and that I had a face-to-face encounter that meets the physician face-to-face encounter requirements with this patient on this date. The encounter with the patient was in whole or in part for the following MEDICAL CONDITION: (primary reason for [place]) MEDICAL NECESSITY: I certify, that based on my findings, NURSING services are a medically necessary home health service. HOME BOUND STATUS: I certify that my clinical findings support that this patient is homebound (i.e., Due to illness or injury, pt requires aid of supportive devices such as crutches, cane, wheelchairs, walkers, the use of special transportation or the assistance of another person to leave their place of residence. There is a normal inability to leave the home and doing so requires considerable and taxing effort. Other absences are for medical reasons / religious services and are infrequent or of short duration when for other reasons). o If current dressing causes regression in wound condition, may D/C ordered dressing product/s and apply Normal Saline Moist Dressing daily until next [place] / Other MD appointment. [place] of regression in wound condition at [phone]. o Please direct any NON-WOUND related issues/requests for orders to patient's Primary Care Physician Electronic Signature(s) Signed: 01/24/2019 4:27:09 PM By: Worthy Keeler PA-C Signed: 01/28/2019 11:13:25 AM By: Harold Barban Entered By: Harold Barban on 01/24/2019 11:31:44 [name], [name] ([id number]) -------------------------------------------------------------------------------- Problem List Details Patient Name: [name] Date  of Service: 01/24/2019 10:30 AM Medical Record Number: [id number] Patient Account Number: [id number] Date of Birth/Sex: [birth year] (85 y.o. F) Treating RN: Montey Hora Primary Care Provider: Grayland Ormond Other Clinician: Referring Provider: Grayland Ormond Treating Provider/Extender: Melburn Hake, HOYT Weeks in Treatment: 3 Active Problems ICD-10 Evaluated Encounter Code Description Active Date Today Diagnosis 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, stage 4 05/10/2018 No Yes L89.524 Pressure ulcer of left ankle, stage 4 04/25/2018 No Yes L89.623 Pressure ulcer of left heel, stage 3 10/11/2018 No Yes L89.892 Pressure ulcer of other site, stage 2 11/23/2018 No Yes I70.233 Atherosclerosis of native arteries of  right leg with ulceration of 08/10/2017 No Yes ankle I70.243 Atherosclerosis of native arteries of left leg with ulceration of 07/10/2018 No Yes ankle I89.0 Lymphedema, not elsewhere classified 08/10/2017 No Yes B35.4 Tinea corporis 09/28/2017 No Yes 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, left ankle and foot 08/14/2018 No Yes Inactive Problems Resolved Problems GRACIEANN, STANNARD ([id number]) ICD-10 Code Description Active Date Resolved Date S90.822A Blister (nonthermal), left foot, initial encounter 10/11/2018 10/11/2018 Electronic Signature(s) Signed: 01/24/2019 4:27:09 PM By: Worthy Keeler PA-C Entered By: Worthy Keeler on 01/24/2019 10:44:28 Leavy, [name] ([id number]) -------------------------------------------------------------------------------- Progress Note Details Patient Name: [name] Date of Service: 01/24/2019 10:30 AM Medical Record Number: [id number] Patient Account Number: [id number] Date of Birth/Sex: Nov 05, 1923 (85 y.o. 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 Dr. Amalia Hailey the podiatrist saw her on 07/20/2017 and sent her to the wound center for possible hyperbaric oxygen therapy. past medical history of peripheral vascular disease, varicose veins, status post appendectomy, basal cell carcinoma excision from the left leg, cholecystectomy, pacemaker placement, right lower extremity angiography done by Dr. dew in March 2017 with placement of a stent. there is also note of a successful ablation of the right small saphenous vein done which was reviewed by ultrasound on 10/24/2016. the patient had a right small saphenous vein ablation done on 10/20/2016. The patient has never been a smoker. She has been seen by Dr. Corene Cornea dew the vascular surgeon who most recently saw her on 06/15/2017 for evaluation of ongoing problems with right leg swelling. She had a lower extremity arterial duplex examination done(02/13/17) which showed patent distal right superficial femoral artery stent and above-the-knee popliteal stent without evidence of restenosis. The ABI was more than 1.3 on the right and more than 1.3 on the left. This was consistent with noncompressible arteries due to medial calcification. The right great toe pressure and PPG waveforms are within normal limits and the left great toe pressure and PPG waveforms are decreased. he recommended she continue to wear her compression stockings and continue with elevation. She is scheduled to have a noninvasive arterial study in the near future 08/16/2017 -- had a lower extremity arterial duplex examination done which showed patent distal right superficial  femoral artery stent and above-the-knee popliteal stent without evidence of restenosis. The ABI was more than 1.3 on the right and more than 1.3 on the left. This was consistent with noncompressible arteries due to medial calcification. The right great toe pressure and PPG waveforms are within normal limits and the left great toe pressure and PPG waveforms are decreased. the x-ray of the right ankle has not yet been done 08/24/2017 -- had a right ankle x-ray -- IMPRESSION:1. No fracture, bone lesion or evidence of osteomyelitis. 2. Lateral soft tissue swelling with a soft tissue ulcer. she has not yet seen the vascular surgeon for review 08/31/17 on evaluation today patient's wound appears to be showing signs of improvement. She still with her appointment with vascular in order to review her results of her vascular study and then determine if any intervention would be recommended  at that time. No fevers, chills, nausea, or vomiting noted at this time. She has been tolerating the dressing changes without complication. 09/28/17 on evaluation today patient's wound appears to show signs of good improvement in regard to the granulation tissue which is surfacing. There is still a layer of slough covering the wound and the posterior portion is still significantly deeper than the anterior nonetheless there has been some good sign of things moving towards the better. She is going to go back to Dr. dew for reevaluation to ensure her blood flow is still appropriate. That will be before her next evaluation with [place] next week. No fevers, chills, nausea, or vomiting noted at this time. Patient does have some discomfort rated to be a 3-4/10 depending on activity specifically cleansing the wound makes it worse. 10/05/2017 -- the patient was seen by Dr. Lucky Cowboy last week and noninvasive studies showed a normal right ABI with brisk MAKAYELA, SECREST. ([id number]) triphasic waveforms consistent with no arterial  insufficiency including normal digital pressures. The duplex showed a patent distal right SFA stent and the proximal SFA was also normal. He was pleased with her test and thought she should have enough of perfusion for normal wound healing. He would see her back in 6 months time. 12/21/17 on evaluation today patient appears to be doing fairly well in regard to her right lateral ankle wound. Unfortunately the main issue that she is expansion at this point is that she is having some issues with what appears to be some cellulitis in the right anterior shin. She has also been noting a little bit of uncomfortable feeling especially last night and her ankle area. I'm afraid that she made the developing a little bit of an infection. With that being said I think it is in the early stages. 12/28/17 on evaluation today patient's ankle appears to be doing excellent. She's making good progress at this point the cellulitis seems to have improved after last week's evaluation. Overall she is having no significant discomfort which is excellent news. She does have an appointment with Dr. dew on March 29, 2018 for reevaluation in regard to the stent he placed. She seems to have excellent blood flow in the right lower extremity. 01/19/12 on evaluation today patient's wound appears to be doing very well. In fact she does not appear to require debridement at this point, there's no evidence of infection, and overall from the standpoint of the wound she seems to be doing very well. With that being said I believe that it may be time to switch to different dressing away from the [facility] Dressing she tells me she does have a lot going on her friend actually passed away yesterday and she's also having a lot of issues with her husband this obviously is weighing heavy on her as far as your thoughts and concerns today. 01/25/18 on evaluation today patient appears to be doing fairly well in regard to her right lateral  malleolus. She has been tolerating the dressing changes without complication. Overall I feel like this is definitely showing signs of improvement as far as how the overall appearance of the wound is there's also evidence of epithelium start to migrate over the granulation tissue. In general I think that she is progressing nicely as far as the wound is concerned. The only concern she really has is whether or not we can switch to every other week visits in order to avoid having as many appointments as her daughters have a difficult  time getting her to her appointments as well as the patient's husband to his he is not doing very well at this point. 02/22/18 on evaluation today patient's right lateral malleolus ulcer appears to be doing great. She has been tolerating the dressing changes without complication. Overall you making excellent progress at this time. Patient is having no significant discomfort. 03/15/18 on evaluation today patient appears to be doing much more poorly in regard to her right lateral ankle ulcer at this point. Unfortunately since have last seen her her husband has passed just a few days ago is obviously weighed heavily on her her daughter also had surgery well she is with her today as usual. There does not appear to be any evidence of infection she does seem to have significant contusion/deep tissue injury to the right lateral malleolus which was not noted previous when I saw her last. It's hard to tell of exactly when this injury occurred although during the time she was spending the night in the hospital this may have been most likely. 03/22/18 on evaluation today patient appears to actually be doing very well in regard to her ulcer. She did unfortunately have a setback which was noted last week however the good news is we seem to be getting back on track and in fact the wound in the core did still have some necrotic tissue which will be addressed at this point today but in  general I'm seeing signs that things are on the up and up. She is glad to hear this obviously she's been somewhat concerned that due to the how her wound digressed more recently. 03/29/18 on evaluation today patient appears to be doing fairly well in regard to her right lower extremity lateral malleolus ulcer. She unfortunately does have a new area of pressure injury over the inferior portion where the wound has opened up a little bit larger secondary to the pressure she seems to be getting. She does tell me sometimes when she sleeps at night that it actually hurts and does seem to be pushing on the area little bit more unfortunately. There does not appear to be any evidence of infection which is good news. She has been tolerating the dressing changes without complication. She also did have some bruising in the left second and third toes due to the fact that she may have bump this or injured it although she has neuropathy so she does not feel she did move recently that may have been where this came from. Nonetheless there does not appear to be any evidence of infection at this time. 04/12/18 on evaluation today patient's wound on the right lateral ankle actually appears to be doing a little bit better with a lot of necrotic docking tissue centrally loosening up in clearing away. However she does have the beginnings of a deep tissue injury on the left lateral malleolus likely due to the fact we've been trying offload the right as much as we have. I think she may benefit from an assistive soft device to help with offloading and it looks like they're looking at one of the doughnut conditions that wraps around the lower leg to offload which I think will definitely do a good job. With that being said I think we definitely need to address this issue on the left before it becomes a wound. Patient is not having significant pain. IDALI, LAFEVER ([id number]) 04/19/18 on evaluation today patient appears to  be doing excellent in regard to the progress she's made  with her right lateral ankle ulcer. The left ankle region which did show evidence of a deep tissue injury seems to be resolving there's little fluid noted underneath and a blister there's nothing open at this point in time overall I feel like this is progressing nicely which is good news. She does not seem to be having significant discomfort at this point which is also good news. 04/25/18-She is here in follow up evaluation for bilateral lateral malleolar ulcers. The right lateral malleolus ulcer with pale subcutaneous tissue exposure, central area of ulcer with tendon/periosteum exposed. The left lateral malleolus ulcer now with central area of nonviable tissue, otherwise deep tissue injury. She is wearing compression wraps to the left lower extremity, she will place the right lower extremity compression wraps on when she gets home. She will be out of town over the weekend and return next week and follow-up appointment. She completed her doxycycline this morning 05/03/18 on evaluation today patient appears to be doing very well in regard to her right lateral ankle ulcer in general. At least she's showing some signs of improvement in this regard. Unfortunately she has some additional injury to the left lateral malleolus region which appears to be new likely even over the past several days. Again this determination is based on the overall appearance. With that being said the patient is obviously frustrated about this currently. 05/10/18-She is here in follow-up evaluation for bilateral lateral malleolar ulcers. She states she has purchased offloading shoes/boots and they will arrive tomorrow. She was asked to bring them in the office at next week's appointment so her provider is aware of product being utilized. She continues to sleep on right or left side, she has been encouraged to sleep on her back. The right lateral malleolus ulcer is precariously  close to peri-osteum; will order xray. The left lateral malleolus ulcer is improved. Will switch back to santyl; she will follow up next week. 05/17/18 on evaluation today patient actually appears to be doing very well in regard to her malleolus her ulcers compared to last time I saw them. She does not seem to have as much in the way of contusion at this point which is great news. With that being said she does continue to have discomfort and I do believe that she is still continuing to benefit from the offloading/pressure reducing boots that were recommended. I think this is the key to trying to get this to heal up completely. 05/24/18 on evaluation today patient actually appears to be doing worse at this point in time unfortunately compared to her last week's evaluation. She is having really no increased pain which is good news unfortunately she does have more maceration in your theme and noted surrounding the right lateral ankle the left lateral ankle is not really is erythematous I do not see signs of the overt cellulitis on that side. Unfortunately the wounds do not seem to have shown any signs of improvement since the last evaluation. She also has significant swelling especially on the right compared to previous some of this may be due to infection however also think that she may be served better while she has these wounds by compression wrapping versus continuing to use the Juxta-Lite for the time being. Especially with the amount of drainage that she is experiencing at this point. No fevers, chills, nausea, or vomiting noted at this time. 05/31/18 on evaluation today patient appears to actually be doing better in regard to her right lateral lower extremity ulcer specifically  on the malleolus region. She has been tolerating the antibiotic without complication. With that being said she still continues to have issues but a little bit of redness although nothing like she what she was experiencing  previous. She still continues to pressure to her ankle area she did get the problem on offloading boots unfortunately she will not wear them she states there too uncomfortable and she can't get in and out of the bed. Nonetheless at this point her wounds seem to be continually getting worse which is not what we want I'm getting somewhat concerned about her progress and how things are going to proceed if we do not intervene in some way shape or form. I therefore had a very lengthy conversation today about offloading yet again and even made a specific suggestion for switching her to a memory foam mattress and even gave the information for a specific one that they could look at getting if it was something that they were interested in considering. She does not want to be considered for a hospital bed air mattress although honestly insurance would not cover it that she does not have any wounds on her trunk. 06/14/18 on evaluation today both wounds over the bilateral lateral malleolus her ulcers appear to be doing better there's no evidence of pressure injury at this point. She did get the foam mattress for her bed and this does seem to have been extremely beneficial for her in my pinion. Her daughter states that she is having difficulty getting out of bed because of how soft it is. The patient also relates this to be. Nonetheless I do feel like she's actually doing better. Unfortunately right after and around the time she was getting the mattress she also sustained a fall when she got up to go pick up the phone and ended up injuring her right elbow she has 18 sut[address] place. We are not caring for this currently although home health is going to be taking the sutures out shortly. Nonetheless this may be something that we need to evaluate going forward. It depends on how well it has or has not healed in the end. She also recently saw an orthopedic specialist for an injection in the right shoulder just  before her fall unfortunately the fall seems to have worsened her pain. 06/21/18 on evaluation today patient appears to be doing about the same in regard to her lateral malleolus ulcers. Both appear to be just a little bit deeper but again we are clinging away the necrotic and dead tissue which I think is why this is progressing towards a deeper realm as opposed to improving from my measurement standpoint in that regard. Nonetheless she has been tolerating the dressing changes she absolutely hates the memory foam mattress topper that was obtained for her nonetheless Erielle, Gawronski [name] ([id number]) I do believe this is still doing excellent as far as taking care of excess pressure in regard to the lateral malleolus regions. She in fact has no pressure injury that I see whereas in weeks past it was week by week I was constantly seeing new pressure injuries. Overall I think it has been very beneficial for her. 07/03/18; patient arrives in my clinic today. She has deep punched out areas over her bilateral lateral malleoli. The area on the right has some more depth. We spent a lot of time today talking about pressure relief for these areas. This started when her daughter asked for a prescription for a memory foam mattress. I  have never written a prescription for a mattress and I don't think insurances would pay for that on an ordinary bed. In any case he came up that she has foam boots that she refuses to wear. I would suggest going to these before any other offloading issues when she is in bed. They say she is meticulous about offloading this the rest of the day 07/10/18- She is seen in follow-up evaluation for bilateral, lateral malleolus ulcers. There is no improvement in the ulcers. She has purchased and is sleeping on a memory foam mattress/overlay, she has been using the offloading boots nightly over the past week. She has a follow up appointment with vascular medicine at the end of October, in my  opinion this follow up should be expedited given her deterioration and suboptimal TBI results. We will order plain film xray of the left ankle as deeper structures are palpable; would consider having MRI, regardless of xray report(s). The ulcers will be treated with iodoflex/iodosorb, she is unable to safely change the dressings daily with santyl. 07/19/18 on evaluation today patient appears to be doing in general visually well in regard to her bilateral lateral malleolus ulcers. She has been tolerating the dressing changes without complication which is good news. With that being said we did have an x-ray performed on 07/12/18 which revealed a slight loosen see in the lateral portion of the distal left fibula which may represent artifact but underline lytic destruction or osteomyelitis could not be excluded. MRI was recommended. With that being said we can see about getting the patient scheduled for an MRI to further evaluate this area. In fact we have that scheduled currently for August 20 19,019. 07/26/18 on evaluation today patient's wound on the right lateral ankle actually appears to be doing fairly well at this point in my pinion. She has made some good progress currently. With that being said unfortunately in regard to the left lateral ankle ulcer this seems to be a little bit more problematic at this time. In fact as I further evaluated the situation she actually had bone exposed which is the first time that's been the case in the bone appear to be necrotic. Currently I did review patient's note from Dr. Bunnie Domino office with [facility]. He stated that ABI was 1.26 on the right and 0.95 on the left with good waveforms. Her perfusion is stable not reduced from previous studies and her digital waveforms were pretty good particularly on the right. His conclusion upon review of the note was that there was not much she could do to improve her perfusion and he felt she was adequate  for wound healing. His suggestion was that she continued to see [place] and consider a synthetic skin graft if there was no underlying infection. He plans to see her back in six months or as needed. 08/01/18 on evaluation today patient appears to be doing better in regard to her right lateral ankle ulcer. Her left lateral ankle ulcer is about the same she still has bone involvement in evidence of necrosis. There does not appear to be evidence of infection at this time On the right lateral lower extremity. I have started her on the Augmentin she picked this up and started this yesterday. This is to get her through until she sees infectious disease which is scheduled for 08/12/18. 08/06/18 on evaluation today patient appears to be doing rather well considering my discussion with patient's daughter at the end of last week. The area  which was marked where she had erythema seems to be improved and this is good news. With that being said overall the patient seems to be making good improvement when it comes to the overall appearance of the right lateral ankle ulcer although this has been slow she at least is coming around in this regard. Unfortunately in regard to the left lateral ankle ulcer this is osteomyelitis based on the pathology report as well is bone culture. Nonetheless we are still waiting CT scan. Unfortunately the MRI we originally ordered cannot be performed as the patient is a pacemaker which I had overlooked. Nonetheless we are working on the CT scan approval and scheduling as of now. She did go to the hospital over the weekend and was placed on IV Cefzo for a couple of days. Fortunately this seems to have improved the erythema quite significantly which is good news. There does not appear to be any evidence of worsening infection at this time. She did have some bleeding after the last debridement therefore I did not perform any sharp debridement in regard to left lateral ankle at this point. Patient  has been approved for a snap vac for the right lateral ankle. 08/14/18; the patient with wounds over her bilateral lateral malleoli. The area on the right actually looks quite good. Been using a snap back on this area. Healthy granulation and appears to be filling in. Unfortunately the area on the left is really problematic. 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 Also noted to have cellulitis. She saw Dr. Novella Olive of infectious disease today and was put on linezolid. We are able to verify this with her pharmacy. She is completed the Augmentin that she was already on. We've been using Iodoflex to this TEAIRA, CROFT ([id number]) area 08/23/18 on evaluation today patient's wounds both actually appear to be doing better compared to my prior evaluations. Fortunately she showing signs of good improvement in regard to the overall wound status especially where were using the snap vac on the right. In regard to left lateral malleolus the wound bed actually appears to be much cleaner than previously noted. I do not feel any phone directly probed during evaluation today and though there is tendon noted this does not appear to be necrotic it's actually fairly good as far as the overall appearance of the tendon is concerned. In general the wound bed actually appears to be doing significantly better than it was previous. Patient is currently in the care of [name] and I did review that note today. He actually has her on two weeks of linezolid and then following the patient will be on 1-2 months of Keflex. That is the plan currently. She has been on antibiotics therapy as prescribed by myself initially starting on July 30, 2018 and has been on that continuously up to this point. 08/30/18 on evaluation today patient actually appears to be doing much better in regard to her right lateral malleolus ulcer. She has been  tolerating the dressing changes specifically the snap vac without complication although she did have some issues with the seal currently. Apparently there was some trouble with getting it to maintain over the past week past Sunday. Nonetheless overall the wound appears better in regard to the right lateral malleolus region. In regard to left lateral malleolus this actually show some signs of additional granulation although there still tendon noted in the base of the  wound this appears to be healthy not necrotic in any way whatsoever. We are considering potentially using a snap vac for the left lateral malleolus as well the product wrap from [employer], [place], was present in the clinic today we're going to see this patient I did have her come in with me after obtaining consent from the patient and her daughter in order to look at the wound and see if there's any recommendation one way or another as to whether or not they felt the snapback could be beneficial for the left lateral malleolus region. But the conclusion was that it might be but that this is definitely a little bit deeper wound than what traditionally would be utilized for a snap vac. 09/06/18 on evaluation today patient actually appears to be doing excellent in my pinion in regard to both ankle ulcers. She has been tolerating the dressing changes without complication which is great news. Specifically we have been using the snap vac. In regard to the right ankle I'm not even sure that this is going to be necessary for today and following as the wound has filled in quite nicely. In regard to the left ankle I do believe that we're seeing excellent epithelialization from the edge as well as granulation in the central portion the tendon is still exposed but there's no evidence of necrotic bone and in general I feel like the patient has made excellent progress even compared to last week with just one week of the snap vac. 09/11/18; this is a patient  who has wounds on her bilateral lateral malleoli. Initially both of these were deep stage IV wounds in the setting of chronic arterial insufficiency. She has been revascularized. As I understand think she been using snap vacs to both of these wounds however the area on the right became more superficial and currently she is only using it on the left. Using silver collagen on the right and silver collagen under the back on the left I believe 09/19/18 on evaluation today patient actually appears to be doing very well in regard to her lateral malleolus or ulcers bilaterally. She has been tolerating the dressing changes without complication. Fortunately there does not appear to be any evidence of infection at this time. Overall I feel like she is improving in an excellent manner and I'm very pleased with the fact that everything seems to be turning towards the better for her. This has obviously been a long road. 09/27/18 on evaluation today patient actually appears to be doing very well in regard to her bilateral lateral malleolus ulcers. She has been tolerating the dressing changes without complication. Fortunately there does not appear to be any evidence of infection at this time which is also great news. No fevers, chills, nausea, or vomiting noted at this time. Overall I feel like she is doing excellent with the snap vac on the left malleolus. She had 40 mL of fluid collection over the past week. 10/04/18 on evaluation today patient actually appears to be doing well in regard to her bilateral lateral malleolus ulcers. She continues to tolerate the dressing changes without complication. One issue that I see is the snap vac on the left lateral malleolus which appears to have sealed off some fluid underlying this area and has not really allowed it to heal to the degree that I would like to see. For that reason I did suggest at this point we may want to pack a small piece of packing strip into this region  to allow it to more effectively wick out fluid. 10/11/18 in general the patient today does not feel that she has been doing very well. She's been a little bit lethargic and subsequently is having bodyaches as well according to what she tells me today. With that being said overall she has been concerned with the fact that something may be worsening although to be honest her wounds really have not been appearing poorly. She does have a new ulcer on her left heel unfortunately. This may be pressure related. Nonetheless it seems to me to have potentially started at least as a blister I do not see any evidence of deep tissue injury. In regard to the left ankle the snap vac still seems to be causing the ceiling off of the deeper part of the wound which is in turn trapping fluid. I'm not extremely pleased with the overall appearance as far as progress from last week to this week therefore I'm gonna discontinue [name], [name] ([id number]) the snap vac at this point. 10/18/18 patient unfortunately this point has not been feeling well for the past several days. She was seen by Grayland Ormond her primary care provider who is a Librarian, academic at [facility]. Subsequently she states that she's been very weak and generally feeling malaise. No fevers, chills, nausea, or vomiting noted at this time. With that being said bloodwork was performed at the PCP office on the 11th of this month which showed a white blood cell count of 10.7. This was repeated today and shows a white blood cell count of 12.4. This does show signs of worsening. Coupled with the fact that she is feeling worse and that her left ankle wound is not really showing signs of improvement I feel like this is an indication that the osteomyelitis is likely exacerbating not improving. Overall I think we may also want to check her C-reactive protein and sedimentation rate. Actually did call [facility] office this afternoon while the  patient was in the office here with me. Subsequently based on the findings we discussed treatment possibilities and I think that it is appropriate for [place] to go ahead and initiate treatment with doxycycline which I'm going to do. Subsequently he did agree to see about adding a CRP and sedimentation rate to her orders. If that has not already been drawn to where they can run it they will contact the patient she can come back to have that check. They are in agreement with plan as far as the patient and her daughter are concerned. Nonetheless also think we need to get in touch with Dr. Henreitta Leber office to see about getting the patient scheduled with him as soon as possible. 11/08/18 on evaluation today patient presents for follow-up concerning her bilateral foot and ankle ulcers. I did do an extensive review of her chart in epic today. Subsequently she was seen by [name] he did initiate Cefepime IV antibiotic therapy. Subsequently she had some issues with her PICC line this had to be removed because it was coiled and then replaced. Fortunately that was now settled. Unfortunately she has continued have issues with her left heel as well as the issues that she is experiencing with her bilateral lateral malleolus regions. I do believe however both areas seem to be doing a little bit better on evaluation today which is good news. No fevers, chills, nausea, or vomiting noted at this time. She actually has an angiogram schedule with Dr. dew on this coming Monday, November 11, 2018. Subsequently the patient states that she is feeling much better especially than what she was roughly 2 weeks ago. She actually had to cancel an appointment because she was feeling so poorly. No fevers, chills, nausea, or vomiting noted at this time. 11/15/18 on evaluation today patient actually is status post having had her angiogram with Dr. dew Monday, four days ago. It was noted that she had 60 to 80% stenosis noted in the  extremity. He had to go and work on several areas of the vasculature fortunately he was able to obtain no more than a 30% residual stenosis throughout post procedure. I reviewed this note today. I think this will definitely help with healing at this time. Fortunately there does not appear to be any signs of infection and I do feel like ratio already has a better appearance to it. 11/22/18 upon evaluation today patient actually appears to be doing very well in regard to her wounds in general. The right lateral malleolus looks excellent the heel looks better in the left lateral malleolus also appears to be doing a little better. With that being said the right second toe actually appears to be open and training we been watching this is been dry and stable but now is open. 12/03/2018 Seen today for follow-up and management of multiple bilateral lower extremity wounds. New pressure injury of the great toe which is closed at this time. Wound of the right distal second toe appears larger today with deep undermining and a pocket of fluid present within the undermining region. Left and right malleolus is wounds are stable today with no signs and symptoms of infection.Denies any needs or concerns during exam today. 12/13/18 on evaluation today patient appears to be doing somewhat better in regard to her left heel ulcer. She also seems to be completely healed in regard to the right lateral malleolus ulcer. The left malleolus ulcer is smaller what unfortunately the wounds which are new over the first and second toes of the right foot are what are most concerning at this point especially the second. Both areas did require sharp debridement today. 12/20/18 on evaluation today patient's wound actually appears to be doing better in regard to left lateral ankle and her right lateral ankle continues to remain healed. The hill ulcer on the left is improved. She does have improvement noted as well in regard to both toe  ulcers. Overall I'm very pleased in this regard. No fevers, chills, nausea, or vomiting noted at this time. 12/23/18 on evaluation today patient is seen after she had her toenails trimmed at the podiatrist office due to issues with her right great toe. There was what appeared to be dark eschar on the surface of the wound which had her in the podiatrist concerned. Nonetheless as I remember that during the last office visit I had utilize silver nitrate of this area I was much less concerned about the situation. Subsequently I was able to clean off much of this tissue without any complication today. This does not appear to show any signs of infection and actually look somewhat better compared to last time post debridement. Her second toe on the right foot actually had callous over and there did appear still be some fluid underneath this that would require debridement today. GHISLAINE, HARCUM ([id number]) 12/27/18 on evaluation today patient actually appears to be showing signs of improvement at all locations. Even the left lateral ankle although this is not quite as great as the other sites. Fortunately there  does not appear to be any signs of infection at this time and both of her toes on the right foot seem to be showing signs of improvement which is good news and very pleased in this regard. 01/03/19 on evaluation today patient appears to be doing better for the most part in regard to her wounds in particular. There does not appear to be any evidence of infection at this time which is good news. Fortunately there is no sign of really worsening anywhere except for the right great toe which she does have what appears to be a bruise/deep tissue injury which is very superficial and already resolving. I'm not sure where this came from I questioned her extensively and she does not recall what may have happened with this. Other than that the patient seems to be doing well even the left lateral ankle  ulcer looks good and is getting smaller. 01/10/19 on evaluation today patient appears to be doing well in regard to her left heel wound and both of her toe wounds. Overall I feel like there is definitely improvement here and I'm happy in that regard. With that being said unfortunately she is having issues with the left lateral malleolus ulcer which unfortunately still has a lot of depth to it. This is gonna be a very difficult wound for [place] to be able to truly get to heal. I may want to consider some type of skin substitute to see if this would be of benefit for her. I'll discuss this with her more the next visit most likely. This was something I thought about more at the end of the visit when I was Artie out of the room and the patient had been discharged. 01/17/19 on evaluation today patient appears to be doing very well in regard to her wounds in general. She's been making excellent progress at this time. Fortunately there's no sign of infection at this time either. No fevers, chills, nausea, or vomiting noted at this time. The biggest issue is still her left lateral malleolus where it appears to be doing well and is getting smaller but still shows a small corner where this is deeper and goes down into what appears to be the joint space. Nonetheless this is taking much longer to heal although it still looks better in smaller than previous evaluations. 01/24/19 on evaluation today patient's wounds actually appear to be doing rather well in general overall. She did require some sharp debridement in regard to the right great toe but everything else appears to be doing excellent no debridement was even necessary. No fevers, chills, nausea, or vomiting noted at this time. Patient History Information obtained from Patient. Family History Cancer - Father,Siblings, Heart Disease - Siblings, No family history of Diabetes, Hereditary Spherocytosis, Hypertension, Kidney Disease, Lung Disease, Seizures,  Stroke, Thyroid Problems, Tuberculosis. Social History Never smoker, Marital Status - Married, Alcohol Use - Never, Drug Use - No History, Caffeine Use - Rarely. Medical History Eyes Patient has history of Cataracts - surgery Cardiovascular Patient has history of Congestive Heart Failure, Hypertension Musculoskeletal Patient has history of Osteoarthritis Neurologic Patient has history of Neuropathy Oncologic Denies history of Received Chemotherapy, Received Radiation Review of Systems (ROS) Constitutional Symptoms (General Health) Denies complaints or symptoms of Fever, Chills. Respiratory The patient has no complaints or symptoms. Cardiovascular The patient has no complaints or symptoms. ASTI, MACKLEY ([id number]) Psychiatric The patient has no complaints or symptoms. Objective Constitutional Well-nourished and well-hydrated in no acute distress. Vitals Time Taken: 10:33  AM, Height: 65 in, Weight: 154.3 lbs, BMI: 25.7, Temperature: 98.2 F, Pulse: 76 bpm, Respiratory Rate: 16 breaths/min, Blood Pressure: 117/46 mmHg. Respiratory normal breathing without difficulty. Psychiatric this patient is able to make decisions and demonstrates good insight into disease process. Alert and Oriented x 3. pleasant and cooperative. General Notes: Patient's wound bed currently shows signs of good granulation at this time and all locations other than the right great toe where there was some Slough that require sharp debridement this was performed without complication post debridement the wound bed appears to be doing much better. Integumentary (Hair, Skin) Wound #2 status is Open. Original cause of wound was Gradually Appeared. The wound is located on the Left,Lateral Malleolus. The wound measures 0.7cm length x 0.6cm width x 0.2cm depth; 0.33cm^2 area and 0.066cm^3 volume. There is Fat Layer (Subcutaneous Tissue) Exposed exposed. There is no tunneling or undermining noted. There is a  medium amount of serous drainage noted. The wound margin is epibole. There is large (67-100%) pink granulation within the wound bed. There is a small (1-33%) amount of necrotic tissue within the wound bed including Adherent Slough. The periwound skin appearance exhibited: Maceration, Erythema. The periwound skin appearance did not exhibit: Callus, Crepitus, Excoriation, Induration, Rash, Scarring, Dry/Scaly, Atrophie Blanche, Cyanosis, Ecchymosis, Hemosiderin Staining, Mottled, Pallor, Rubor. The surrounding wound skin color is noted with erythema which is circumferential. Periwound temperature was noted as No Abnormality. The periwound has tenderness on palpation. Wound #3 status is Open. Original cause of wound was Gradually Appeared. The wound is located on the Left Calcaneus. The wound measures 0.9cm length x 0.1cm width x 0.1cm depth; 0.071cm^2 area and 0.007cm^3 volume. There is Fat Layer (Subcutaneous Tissue) Exposed exposed. There is no tunneling or undermining noted. There is a medium amount of serous drainage noted. The wound margin is indistinct and nonvisible. There is medium (34-66%) pink granulation within the wound bed. There is a medium (34-66%) amount of necrotic tissue within the wound bed including Adherent Slough. The periwound skin appearance exhibited: Dry/Scaly. The periwound skin appearance did not exhibit: Callus, Crepitus, Excoriation, Induration, Rash, Scarring, Maceration, Atrophie Blanche, Cyanosis, Ecchymosis, Hemosiderin Staining, Mottled, Pallor, Rubor, Erythema. Periwound temperature was noted as No Abnormality. The periwound has tenderness on palpation. Wound #5 status is Open. Original cause of wound was Footwear Injury. The wound is located on the Right,Distal [employer] The wound measures 0.2cm length x 0.2cm width x 0.1cm depth; 0.031cm^2 area and 0.003cm^3 volume. There is Fat Layer (Subcutaneous Tissue) Exposed exposed. There is no tunneling or undermining  noted. There is a small amount of serous drainage noted. The wound margin is flat and intact. There is small (1-33%) pink granulation within the wound bed. There is a large (67-100%) amount of necrotic tissue within the wound bed including Eschar and Adherent Slough. The periwound skin appearance exhibited: Ecchymosis. The periwound skin appearance did not exhibit: Callus, Crepitus, Excoriation, Induration, Liotta, [name] ([id number]) Rash, Scarring, Dry/Scaly, Maceration, Atrophie Blanche, Cyanosis, Hemosiderin Staining, Mottled, Pallor, Rubor, Erythema. 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 There was a Excisional Skin/Subcutaneous Tissue Debridement with a total area of 0.04 sq cm performed by STONE III, HOYT E., PA-C. With the following instrument(s): Curette to remove Viable and Non-Viable tissue/material. Material removed includes Callus, Subcutaneous Tissue, and Slough after achieving pain control using Lidocaine. No specimens were taken. A time out was conducted at 11:28, prior to the start of the procedure. There was no bleeding. The procedure was tolerated well with a pain level of 0 throughout and a pain level of 0 following the procedure. Post Debridement Measurements: 0.2cm length x 0.2cm width x 0.1cm depth; 0.003cm^3 volume. Post debridement Stage noted as Category/Stage II. Character of Wound/Ulcer Post Debridement is improved. Post procedure Diagnosis Wound #5:  Same as Pre-Procedure Plan Wound Cleansing: Wound #2 Left,Lateral Malleolus: Clean wound with Normal Saline. Cleanse wound with mild soap and water May Shower, gently pat wound dry prior to applying new dressing. Wound #3 Left Calcaneus: Clean wound with Normal Saline. Cleanse wound with mild soap and water May Shower, gently pat wound dry prior to applying new dressing. Wound #5 Right,Distal Toe Great: Clean wound with Normal Saline. Cleanse wound with mild soap and water May Shower, gently pat wound dry prior to applying new dressing. LETISHIA, ELLIOTT ([id number]) Anesthetic (add to Medication List): Wound #2 Left,Lateral Malleolus: Topical Lidocaine 4% cream applied to wound bed prior to debridement (In Clinic Only). Wound #3 Left Calcaneus: Topical Lidocaine 4% cream applied to wound bed prior to debridement (In Clinic Only). Wound #5 Right,Distal Toe Great: Topical Lidocaine 4% cream applied to wound bed prior to debridement (In Clinic Only). Skin Barriers/Peri-Wound Care: Wound #5 Right,Distal Toe Great: Other: - Mupiricin Primary Wound Dressing: Wound #2 Left,Lateral Malleolus: Silver Collagen - small piece of silver collagen to tunnel with iodoform packing over Iodoform packing Gauze Wound #3 Left Calcaneus: Silver Collagen Wound #5 Right,Distal Toe Great: Silver Collagen Secondary Dressing: Wound #2 Left,Lateral Malleolus: Boardered Foam Dressing - Please keep a bordered foam dressing on the right malleolus with bactroban Wound #3 Left Calcaneus: Boardered Foam Dressing - Please keep a bordered foam dressing on the right malleolus with bactroban Wound #5 Right,Distal Toe Great: Other - Coverlet or bandaid Dressing Change Frequency: Wound #2 Left,Lateral Malleolus: Change Dressing Monday, Wednesday, Friday Wound #3 Left Calcaneus: Change Dressing Monday, Wednesday, Friday Wound #5 Right,Distal Toe Great: Change Dressing Monday, Wednesday, Friday Follow-up  Appointments: Wound #2 Left,Lateral Malleolus: Return Appointment in 1 week. Wound #3 Left Calcaneus: Return Appointment in 1 week. Wound #5 Right,Distal Toe Great: Return Appointment in 1 week. Edema Control: Wound #2 Left,Lateral Malleolus: Patient to wear own compression stockings Patient to wear own Velcro compression garment. Elevate legs to the level of the heart and pump ankles as often as possible Wound #3 Left Calcaneus: Patient to wear own compression stockings Patient to wear own Velcro compression garment. Elevate legs to the level of the heart and pump ankles as often as possible Off-Loading: Wound #2 Left,Lateral Malleolus: Turn and reposition every 2 hours Other: - do not put pressure on your ankles Wound #3 Left Calcaneus: Turn and reposition every 2 hours Other: - do not put pressure on your ankles Wound #5 Right,Distal Toe Great: Turn and reposition every 2 hours Other: - do not put pressure on your ankles Additional Orders / Instructions: Wound #2 Left,Lateral Malleolus: [name], [name] ([id number]) Increase protein intake. Increase protein intake. Wound #3 Left Calcaneus: Increase protein intake. Increase protein intake. Wound #5 Right,Distal Toe Great: Increase protein intake.  Increase protein intake. Home Health: Wound #2 Left,Lateral Malleolus: [place] [facility]. Please visit patient Monday, Wednesday, and Friday Home Health Nurse may visit PRN to address patient s wound care needs. FACE TO FACE ENCOUNTER: MEDICARE and MEDICAID PATIENTS: I certify that this patient is under my care and that I had a face-to-face encounter that meets the physician face-to-face encounter requirements with this patient on this date. The encounter with the patient was in whole or in part for the following MEDICAL CONDITION: (primary reason for [place]) MEDICAL NECESSITY: I certify, that based on my findings, NURSING services are a medically  necessary home health service. HOME BOUND STATUS: I certify that my clinical findings support that this patient is homebound (i.e., Due to illness or injury, pt requires aid of supportive devices such as crutches, cane, wheelchairs, walkers, the use of special transportation or the assistance of another person to leave their place of residence. There is a normal inability to leave the home and doing so requires considerable and taxing effort. Other absences are for medical reasons / religious services and are infrequent or of short duration when for other reasons). If current dressing causes regression in wound condition, may D/C ordered dressing product/s and apply Normal Saline Moist Dressing daily until next [place] / Other MD appointment. [place] of regression in wound condition at [phone]. Please direct any NON-WOUND related issues/requests for orders to patient's Primary Care Physician Wound #3 Left Calcaneus: [place] [facility]. Please visit patient Monday, Wednesday, and Friday Home Health Nurse may visit PRN to address patient s wound care needs. FACE TO FACE ENCOUNTER: MEDICARE and MEDICAID PATIENTS: I certify that this patient is under my care and that I had a face-to-face encounter that meets the physician face-to-face encounter requirements with this patient on this date. The encounter with the patient was in whole or in part for the following MEDICAL CONDITION: (primary reason for [place]) MEDICAL NECESSITY: I certify, that based on my findings, NURSING services are a medically necessary home health service. HOME BOUND STATUS: I certify that my clinical findings support that this patient is homebound (i.e., Due to illness or injury, pt requires aid of supportive devices such as crutches, cane, wheelchairs, walkers, the use of special transportation or the assistance of another person to leave their place of residence.  There is a normal inability to leave the home and doing so requires considerable and taxing effort. Other absences are for medical reasons / religious services and are infrequent or of short duration when for other reasons). If current dressing causes regression in wound condition, may D/C ordered dressing product/s and apply Normal Saline Moist Dressing daily until next [place] / Other MD appointment. [place] of regression in wound condition at [phone]. Please direct any NON-WOUND related issues/requests for orders to patient's Primary Care Physician Wound #5 Right,Distal Toe Great: [place] [facility]. Please visit patient Monday, Wednesday, and Friday Home Health Nurse may visit PRN to address patient s wound care needs. FACE TO FACE ENCOUNTER: MEDICARE and MEDICAID PATIENTS: I certify that this patient is under my care and that I had a face-to-face encounter that meets the physician face-to-face encounter requirements with this patient on this date. The encounter with the patient was in whole or in part for the following MEDICAL CONDITION: (primary reason for [place]) MEDICAL NECESSITY: I certify, that based on my findings, NURSING services are  a medically necessary home health service. HOME BOUND STATUS: I certify that my clinical findings support that this patient is homebound (i.e., Due to illness or injury, pt requires aid of supportive devices such as crutches, cane, wheelchairs, walkers, the use of special transportation or the assistance of another person to leave their place of residence. There is a normal inability to leave the home and doing so requires considerable and taxing effort. Other absences are for medical reasons / religious services and are infrequent or of short duration when for other reasons). If current dressing causes regression in wound condition, may D/C ordered dressing product/s and apply Normal  Saline Moist Dressing daily until next [place] / Other MD appointment. [place] of regression in wound condition at [phone]. Please direct any NON-WOUND related issues/requests for orders to patient's Primary Care Physician [name], [place] [name] ([id number]) My suggestion is gonna be that we continue with the above wound care measures for the next week. Patient is in agreement the plan. We will subsequently see were things stand at follow-up. All orders will stay the same as it seems to be doing well for her. Please see above for specific wound care orders. We will see patient for re-evaluation in 1 week(s) here in the clinic. If anything worsens or changes patient will contact our office for additional recommendations. Electronic Signature(s) Signed: 01/24/2019 4:27:09 PM By: Worthy Keeler PA-C Entered By: Worthy Keeler on 01/24/2019 11:35:11 Steele, [name] ([id number]) -------------------------------------------------------------------------------- ROS/PFSH Details Patient Name: [name] Date of Service: 01/24/2019 10:30 AM Medical Record Number: [id number] Patient Account Number: [id number] Date of Birth/Sex: 12/24/1922 (85 y.o. F) Treating RN: Montey Hora Primary Care Provider: Grayland Ormond Other Clinician: Referring Provider: Grayland Ormond Treating Provider/Extender: Melburn Hake, HOYT Weeks in Treatment: 35 Information Obtained From Patient Wound History Do you currently have one or more open woundso Yes How many open wounds do you currently haveo 1 Approximately how long have you had your woundso 2 yrs How have you been treating your wound(s) until nowo mupirocin, soaking in epsom salt Has your wound(s) ever healed and then re-openedo No Have you had any lab work done in the past montho No Have you tested positive for an antibiotic resistant organism (MRSA, VRE)o No Have you tested positive for osteomyelitis (bone  infection)o No Have you had any tests for circulation on your legso Yes Who ordered the testo Dr. Lucky Cowboy [facility] test [facility] Constitutional Symptoms (General Health) Complaints and Symptoms: Negative for: Fever; Chills Eyes Medical History: Positive for: Cataracts - surgery Respiratory Complaints and Symptoms: No Complaints or Symptoms Cardiovascular Complaints and Symptoms: No Complaints or Symptoms Medical History: Positive for: Congestive Heart Failure; Hypertension Musculoskeletal Medical History: Positive for: Osteoarthritis Neurologic Medical History: Positive for: Neuropathy MALANI, LEES ([id number]) Oncologic Medical History: Negative for: Received Chemotherapy; Received Radiation Psychiatric Complaints and Symptoms: No Complaints or Symptoms HBO Extended History Items Eyes: Cataracts Immunizations Pneumococcal Vaccine: Received Pneumococcal Vaccination: Yes Implantable Devices Family and Social History Cancer: Yes - Father,Siblings; Diabetes: No; Heart Disease: Yes - Siblings; Hereditary Spherocytosis: No; Hypertension: No; Kidney Disease: No; Lung Disease: No; Seizures: No; Stroke: No; Thyroid Problems: No; Tuberculosis: No; Never smoker; Marital Status - Married; Alcohol Use: Never; Drug Use: No History; Caffeine Use: Rarely; Financial Concerns: No; Food, Clothing or Shelter Needs: No; Support System Lacking: No; Transportation Concerns: No; Advanced Directives: No; Patient does not want information on Advanced Directives; Do not resuscitate: No; Living Will: Yes (  Not Provided); Medical Power of Attorney: No Physician Affirmation I have reviewed and agree with the above information. Electronic Signature(s) Signed: 01/24/2019 4:27:09 PM By: Worthy Keeler PA-C Signed: 01/24/2019 4:37:43 PM By: Montey Hora Entered By: Worthy Keeler on 01/24/2019 11:34:44 Nickle, [name]  ([id number]) -------------------------------------------------------------------------------- SuperBill Details Patient Name: [name] Date of Service: 01/24/2019 Medical Record Number: [id number] Patient Account Number: [id number] Date of Birth/Sex: 1923-04-22 (85 y.o. 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 I70.243 Atherosclerosis of native arteries of left leg with ulceration of ankle I89.0 Lymphedema, not elsewhere classified B35.4 Tinea corporis 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

## 2019-01-31 ENCOUNTER — Encounter (INDEPENDENT_AMBULATORY_CARE_PROVIDER_SITE_OTHER): Payer: Medicare Other

## 2019-01-31 ENCOUNTER — Encounter: Payer: Medicare Other | Admitting: Physician Assistant

## 2019-01-31 ENCOUNTER — Ambulatory Visit (INDEPENDENT_AMBULATORY_CARE_PROVIDER_SITE_OTHER): Payer: Medicare Other | Admitting: Vascular Surgery

## 2019-01-31 DIAGNOSIS — I70243 Atherosclerosis of native arteries of left leg with ulceration of ankle: Secondary | ICD-10-CM | POA: Diagnosis not present

## 2019-02-02 NOTE — Progress Notes (Signed)
SEDALIA, GREESON ([id number]) Visit Report for 01/31/2019 Chief Complaint Document Details Patient Name: [name], [name]. Date of Service: 01/31/2019 3:15 PM Medical Record Number: [id number] Patient Account Number: [id number] Date of Birth/Sex: [birth year] (85 y.o. F) Treating RN: Harold Barban Primary Care Provider: Grayland Ormond Other Clinician: Referring Provider: Grayland Ormond Treating Provider/Extender: Melburn Hake, Janaria Mccammon Weeks in Treatment: 46 Information Obtained from: Patient Chief Complaint Patient is here for right lateral malleolus, right 2nd distal toe and left lateral malleolus ulcer Electronic Signature(s) Signed: 01/31/2019 5:19:05 PM By: Worthy Keeler PA-C Entered By: Worthy Keeler on 01/31/2019 15:36:59 Kochel, [name] ([id number]) -------------------------------------------------------------------------------- Debridement Details Patient Name: [name] Date of Service: 01/31/2019 3:15 PM Medical Record Number: [id number] Patient Account Number: [id number] Date of Birth/Sex: 08-Jun-1923 (85 y.o. F) Treating RN: Harold Barban Primary Care Provider: Grayland Ormond Other Clinician: Referring Provider: Grayland Ormond Treating Provider/Extender: Melburn Hake, Cassius Cullinane Weeks in Treatment: 77 Debridement Performed for Wound #5 Right,Distal Toe Great Assessment: Performed By: Physician STONE III, Khali Perella E., PA-C Debridement Type: Debridement Level of Consciousness (Pre- Awake and Alert procedure): Pre-procedure Verification/Time Yes - 16:25 Out Taken: Start Time: 16:25 Pain Control: Lidocaine Total Area Debrided (L x W): 0.2 (cm) x 0.2 (cm) = 0.04 (cm) Tissue and other material Viable, Non-Viable, Callus, Slough, Subcutaneous, Slough debrided: Level: Skin/Subcutaneous Tissue Debridement Description: Excisional Instrument: Curette Bleeding: None End Time: 16:28 Procedural Pain: 0 Post Procedural Pain: 0 Response to Treatment: Procedure was  tolerated well Level of Consciousness Awake and Alert (Post-procedure): Post Debridement Measurements of Total Wound Length: (cm) 0.2 Stage: Category/Stage II Width: (cm) 0.2 Depth: (cm) 0.8 Volume: (cm) 0.025 Character of Wound/Ulcer Post Improved Debridement: Post Procedure Diagnosis Same as Pre-procedure Electronic Signature(s) Signed: 01/31/2019 5:19:05 PM By: Worthy Keeler PA-C Signed: 01/31/2019 5:19:20 PM By: Harold Barban Entered By: Harold Barban on 01/31/2019 16:26:36 [name], [name] ([id number]) -------------------------------------------------------------------------------- HPI Details Patient Name: [name] Date of Service: 01/31/2019 3:15 PM Medical Record Number: [id number] Patient Account Number: [id number] Date of Birth/Sex: [birth year] (85 y.o. 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 Dr. Amalia Hailey the podiatrist saw her on 07/20/2017 and sent her to the wound center for possible hyperbaric oxygen therapy. past medical history of peripheral vascular disease, varicose veins, status post appendectomy, basal cell carcinoma excision from the left leg, cholecystectomy, pacemaker placement, right lower extremity angiography done by Dr. dew in March 2017 with placement of a stent. there is also note of a successful ablation of the right small saphenous vein done which was reviewed by ultrasound on 10/24/2016. the patient had a right small saphenous vein ablation done on 10/20/2016. The patient has never been a smoker. She has been seen by Dr. Corene Cornea dew the vascular  surgeon who most recently saw her on 06/15/2017 for evaluation of ongoing problems with right leg swelling. She had a lower extremity arterial duplex examination done(02/13/17) which showed patent distal right superficial femoral artery stent and above-the-knee popliteal stent without evidence of restenosis. The ABI was more than 1.3 on the right and more than 1.3 on the left. This was consistent with noncompressible arteries due to medial calcification. The right great toe pressure and PPG waveforms  are within normal limits and the left great toe pressure and PPG waveforms are decreased. he recommended she continue to wear her compression stockings and continue with elevation. She is scheduled to have a noninvasive arterial study in the near future 08/16/2017 -- had a lower extremity arterial duplex examination done which showed patent distal right superficial femoral artery stent and above-the-knee popliteal stent without evidence of restenosis. The ABI was more than 1.3 on the right and more than 1.3 on the left. This was consistent with noncompressible arteries due to medial calcification. The right great toe pressure and PPG waveforms are within normal limits and the left great toe pressure and PPG waveforms are decreased. the x-ray of the right ankle has not yet been done 08/24/2017 -- had a right ankle x-ray -- IMPRESSION:1. No fracture, bone lesion or evidence of osteomyelitis. 2. Lateral soft tissue swelling with a soft tissue ulcer. she has not yet seen the vascular surgeon for review 08/31/17 on evaluation today patient's wound appears to be showing signs of improvement. She still with her appointment with vascular in order to review her results of her vascular study and then determine if any intervention would be recommended at that time. No fevers, chills, nausea, or vomiting noted at this time. She has been tolerating the dressing changes without complication. 09/28/17 on evaluation  today patient's wound appears to show signs of good improvement in regard to the granulation tissue which is surfacing. There is still a layer of slough covering the wound and the posterior portion is still significantly deeper than the anterior nonetheless there has been some good sign of things moving towards the better. She is going to go back to Dr. dew for reevaluation to ensure her blood flow is still appropriate. That will be before her next evaluation with [place] next week. No fevers, chills, nausea, or vomiting noted at this time. Patient does have some discomfort rated to be a 3-4/10 depending on activity specifically cleansing the wound makes it worse. 10/05/2017 -- the patient was seen by Dr. Lucky Cowboy last week and noninvasive studies showed a normal right ABI with brisk triphasic waveforms consistent with no arterial insufficiency including normal digital pressures. The duplex showed a patent distal right SFA stent and the proximal SFA was also normal. He was pleased with her test and thought she should have enough of perfusion for normal wound healing. He would see her back in 6 months time. 12/21/17 on evaluation today patient appears to be doing fairly well in regard to her right lateral ankle wound. Unfortunately the main issue that she is expansion at this point is that she is having some issues with what appears to be some cellulitis in the ZAMANTHA, STREBEL. ([id number]) right anterior shin. She has also been noting a little bit of uncomfortable feeling especially last night and her ankle area. I'm afraid that she made the developing a little bit of an infection. With that being said I think it is in the early stages. 12/28/17 on evaluation today patient's ankle appears to be doing excellent. She's making good progress at this point the cellulitis seems to have improved after last week's evaluation. Overall she is having no significant discomfort which is excellent news. She does have an  appointment with Dr. dew on March 29, 2018 for reevaluation in regard to the stent he placed. She seems to have excellent blood flow in the right lower extremity. 01/19/12 on evaluation today patient's wound appears to be doing very well. In  fact she does not appear to require debridement at this point, there's no evidence of infection, and overall from the standpoint of the wound she seems to be doing very well. With that being said I believe that it may be time to switch to different dressing away from the [facility] Dressing she tells me she does have a lot going on her friend actually passed away yesterday and she's also having a lot of issues with her husband this obviously is weighing heavy on her as far as your thoughts and concerns today. 01/25/18 on evaluation today patient appears to be doing fairly well in regard to her right lateral malleolus. She has been tolerating the dressing changes without complication. Overall I feel like this is definitely showing signs of improvement as far as how the overall appearance of the wound is there's also evidence of epithelium start to migrate over the granulation tissue. In general I think that she is progressing nicely as far as the wound is concerned. The only concern she really has is whether or not we can switch to every other week visits in order to avoid having as many appointments as her daughters have a difficult time getting her to her appointments as well as the patient's husband to his he is not doing very well at this point. 02/22/18 on evaluation today patient's right lateral malleolus ulcer appears to be doing great. She has been tolerating the dressing changes without complication. Overall you making excellent progress at this time. Patient is having no significant discomfort. 03/15/18 on evaluation today patient appears to be doing much more poorly in regard to her right lateral ankle ulcer at this point. Unfortunately since have last  seen her her husband has passed just a few days ago is obviously weighed heavily on her her daughter also had surgery well she is with her today as usual. There does not appear to be any evidence of infection she does seem to have significant contusion/deep tissue injury to the right lateral malleolus which was not noted previous when I saw her last. It's hard to tell of exactly when this injury occurred although during the time she was spending the night in the hospital this may have been most likely. 03/22/18 on evaluation today patient appears to actually be doing very well in regard to her ulcer. She did unfortunately have a setback which was noted last week however the good news is we seem to be getting back on track and in fact the wound in the core did still have some necrotic tissue which will be addressed at this point today but in general I'm seeing signs that things are on the up and up. She is glad to hear this obviously she's been somewhat concerned that due to the how her wound digressed more recently. 03/29/18 on evaluation today patient appears to be doing fairly well in regard to her right lower extremity lateral malleolus ulcer. She unfortunately does have a new area of pressure injury over the inferior portion where the wound has opened up a little bit larger secondary to the pressure she seems to be getting. She does tell me sometimes when she sleeps at night that it actually hurts and does seem to be pushing on the area little bit more unfortunately. There does not appear to be any evidence of infection which is good news. She has been tolerating the dressing changes without complication. She also did have some bruising in the left second and third toes due  to the fact that she may have bump this or injured it although she has neuropathy so she does not feel she did move recently that may have been where this came from. Nonetheless there does not appear to be any evidence of  infection at this time. 04/12/18 on evaluation today patient's wound on the right lateral ankle actually appears to be doing a little bit better with a lot of necrotic docking tissue centrally loosening up in clearing away. However she does have the beginnings of a deep tissue injury on the left lateral malleolus likely due to the fact we've been trying offload the right as much as we have. I think she may benefit from an assistive soft device to help with offloading and it looks like they're looking at one of the doughnut conditions that wraps around the lower leg to offload which I think will definitely do a good job. With that being said I think we definitely need to address this issue on the left before it becomes a wound. Patient is not having significant pain. 04/19/18 on evaluation today patient appears to be doing excellent in regard to the progress she's made with her right lateral ankle ulcer. The left ankle region which did show evidence of a deep tissue injury seems to be resolving there's little fluid noted underneath and a blister there's nothing open at this point in time overall I feel like this is progressing nicely which is good news. She does not seem to be having significant discomfort at this point which is also good news. 04/25/18-She is here in follow up evaluation for bilateral lateral malleolar ulcers. The right lateral malleolus ulcer with pale subcutaneous tissue exposure, central area of ulcer with tendon/periosteum exposed. The left lateral malleolus ulcer now with Stanislawski, [name] ([id number]) central area of nonviable tissue, otherwise deep tissue injury. She is wearing compression wraps to the left lower extremity, she will place the right lower extremity compression wraps on when she gets home. She will be out of town over the weekend and return next week and follow-up appointment. She completed her doxycycline this morning 05/03/18 on evaluation today patient appears  to be doing very well in regard to her right lateral ankle ulcer in general. At least she's showing some signs of improvement in this regard. Unfortunately she has some additional injury to the left lateral malleolus region which appears to be new likely even over the past several days. Again this determination is based on the overall appearance. With that being said the patient is obviously frustrated about this currently. 05/10/18-She is here in follow-up evaluation for bilateral lateral malleolar ulcers. She states she has purchased offloading shoes/boots and they will arrive tomorrow. She was asked to bring them in the office at next week's appointment so her provider is aware of product being utilized. She continues to sleep on right or left side, she has been encouraged to sleep on her back. The right lateral malleolus ulcer is precariously close to peri-osteum; will order xray. The left lateral malleolus ulcer is improved. Will switch back to santyl; she will follow up next week. 05/17/18 on evaluation today patient actually appears to be doing very well in regard to her malleolus her ulcers compared to last time I saw them. She does not seem to have as much in the way of contusion at this point which is great news. With that being said she does continue to have discomfort and I do believe that she is still continuing  to benefit from the offloading/pressure reducing boots that were recommended. I think this is the key to trying to get this to heal up completely. 05/24/18 on evaluation today patient actually appears to be doing worse at this point in time unfortunately compared to her last week's evaluation. She is having really no increased pain which is good news unfortunately she does have more maceration in your theme and noted surrounding the right lateral ankle the left lateral ankle is not really is erythematous I do not see signs of the overt cellulitis on that side. Unfortunately the  wounds do not seem to have shown any signs of improvement since the last evaluation. She also has significant swelling especially on the right compared to previous some of this may be due to infection however also think that she may be served better while she has these wounds by compression wrapping versus continuing to use the Juxta-Lite for the time being. Especially with the amount of drainage that she is experiencing at this point. No fevers, chills, nausea, or vomiting noted at this time. 05/31/18 on evaluation today patient appears to actually be doing better in regard to her right lateral lower extremity ulcer specifically on the malleolus region. She has been tolerating the antibiotic without complication. With that being said she still continues to have issues but a little bit of redness although nothing like she what she was experiencing previous. She still continues to pressure to her ankle area she did get the problem on offloading boots unfortunately she will not wear them she states there too uncomfortable and she can't get in and out of the bed. Nonetheless at this point her wounds seem to be continually getting worse which is not what we want I'm getting somewhat concerned about her progress and how things are going to proceed if we do not intervene in some way shape or form. I therefore had a very lengthy conversation today about offloading yet again and even made a specific suggestion for switching her to a memory foam mattress and even gave the information for a specific one that they could look at getting if it was something that they were interested in considering. She does not want to be considered for a hospital bed air mattress although honestly insurance would not cover it that she does not have any wounds on her trunk. 06/14/18 on evaluation today both wounds over the bilateral lateral malleolus her ulcers appear to be doing better there's no evidence of pressure injury at  this point. She did get the foam mattress for her bed and this does seem to have been extremely beneficial for her in my pinion. Her daughter states that she is having difficulty getting out of bed because of how soft it is. The patient also relates this to be. Nonetheless I do feel like she's actually doing better. Unfortunately right after and around the time she was getting the mattress she also sustained a fall when she got up to go pick up the phone and ended up injuring her right elbow she has 18 sutures in place. We are not caring for this currently although home health is going to be taking the sutures out shortly. Nonetheless this may be something that we need to evaluate going forward. It depends on how well it has or has not healed in the end. She also recently saw an orthopedic specialist for an injection in the right shoulder just before her fall unfortunately the fall seems to have worsened her  pain. 06/21/18 on evaluation today patient appears to be doing about the same in regard to her lateral malleolus ulcers. Both appear to be just a little bit deeper but again we are clinging away the necrotic and dead tissue which I think is why this is progressing towards a deeper realm as opposed to improving from my measurement standpoint in that regard. Nonetheless she has been tolerating the dressing changes she absolutely hates the memory foam mattress topper that was obtained for her nonetheless I do believe this is still doing excellent as far as taking care of excess pressure in regard to the lateral malleolus regions. She in fact has no pressure injury that I see whereas in weeks past it was week by week I was constantly seeing new pressure injuries. Overall I think it has been very beneficial for her. 07/03/18; patient arrives in my clinic today. She has deep punched out areas over her bilateral lateral malleoli. The area on the right has some more depth. [name], [name]  ([id number]) We spent a lot of time today talking about pressure relief for these areas. This started when her daughter asked for a prescription for a memory foam mattress. I have never written a prescription for a mattress and I don't think insurances would pay for that on an ordinary bed. In any case he came up that she has foam boots that she refuses to wear. I would suggest going to these before any other offloading issues when she is in bed. They say she is meticulous about offloading this the rest of the day 07/10/18- She is seen in follow-up evaluation for bilateral, lateral malleolus ulcers. There is no improvement in the ulcers. She has purchased and is sleeping on a memory foam mattress/overlay, she has been using the offloading boots nightly over the past week. She has a follow up appointment with vascular medicine at the end of October, in my opinion this follow up should be expedited given her deterioration and suboptimal TBI results. We will order plain film xray of the left ankle as deeper structures are palpable; would consider having MRI, regardless of xray report(s). The ulcers will be treated with iodoflex/iodosorb, she is unable to safely change the dressings daily with santyl. 07/19/18 on evaluation today patient appears to be doing in general visually well in regard to her bilateral lateral malleolus ulcers. She has been tolerating the dressing changes without complication which is good news. With that being said we did have an x-ray performed on 07/12/18 which revealed a slight loosen see in the lateral portion of the distal left fibula which may represent artifact but underline lytic destruction or osteomyelitis could not be excluded. MRI was recommended. With that being said we can see about getting the patient scheduled for an MRI to further evaluate this area. In fact we have that scheduled currently for August 20 19,019. 07/26/18 on evaluation today patient's wound on the right  lateral ankle actually appears to be doing fairly well at this point in my pinion. She has made some good progress currently. With that being said unfortunately in regard to the left lateral ankle ulcer this seems to be a little bit more problematic at this time. In fact as I further evaluated the situation she actually had bone exposed which is the first time that's been the case in the bone appear to be necrotic. Currently I did review patient's note from Dr. Bunnie Domino office with [facility]. He stated that ABI was  1.26 on the right and 0.95 on the left with good waveforms. Her perfusion is stable not reduced from previous studies and her digital waveforms were pretty good particularly on the right. His conclusion upon review of the note was that there was not much she could do to improve her perfusion and he felt she was adequate for wound healing. His suggestion was that she continued to see [place] and consider a synthetic skin graft if there was no underlying infection. He plans to see her back in six months or as needed. 08/01/18 on evaluation today patient appears to be doing better in regard to her right lateral ankle ulcer. Her left lateral ankle ulcer is about the same she still has bone involvement in evidence of necrosis. There does not appear to be evidence of infection at this time On the right lateral lower extremity. I have started her on the Augmentin she picked this up and started this yesterday. This is to get her through until she sees infectious disease which is scheduled for 08/12/18. 08/06/18 on evaluation today patient appears to be doing rather well considering my discussion with patient's daughter at the end of last week. The area which was marked where she had erythema seems to be improved and this is good news. With that being said overall the patient seems to be making good improvement when it comes to the overall appearance of the right lateral ankle ulcer  although this has been slow she at least is coming around in this regard. Unfortunately in regard to the left lateral ankle ulcer this is osteomyelitis based on the pathology report as well is bone culture. Nonetheless we are still waiting CT scan. Unfortunately the MRI we originally ordered cannot be performed as the patient is a pacemaker which I had overlooked. Nonetheless we are working on the CT scan approval and scheduling as of now. She did go to the hospital over the weekend and was placed on IV Cefzo for a couple of days. Fortunately this seems to have improved the erythema quite significantly which is good news. There does not appear to be any evidence of worsening infection at this time. She did have some bleeding after the last debridement therefore I did not perform any sharp debridement in regard to left lateral ankle at this point. Patient has been approved for a snap vac for the right lateral ankle. 08/14/18; the patient with wounds over her bilateral lateral malleoli. The area on the right actually looks quite good. Been using a snap back on this area. Healthy granulation and appears to be filling in. Unfortunately the area on the left is really problematic. 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 Also noted to have cellulitis. She saw Dr. Novella Olive of infectious disease today and was put on linezolid. We are able to verify this with her pharmacy. She is completed the Augmentin that she was already on. We've been using Iodoflex to this area 08/23/18 on evaluation today patient's wounds both actually appear to be doing better compared to my prior evaluations. Fortunately she showing signs of good improvement in regard to the overall wound status especially where were using the snap vac on the right. In regard to left lateral malleolus the wound bed actually appears to be much cleaner than  previously noted. I do not feel any phone directly probed during evaluation today and though there is tendon noted this does not  appear [name], [name] ([id number]) to be necrotic it's actually fairly good as far as the overall appearance of the tendon is concerned. In general the wound bed actually appears to be doing significantly better than it was previous. Patient is currently in the care of [name] and I did review that note today. He actually has her on two weeks of linezolid and then following the patient will be on 1-2 months of Keflex. That is the plan currently. She has been on antibiotics therapy as prescribed by myself initially starting on July 30, 2018 and has been on that continuously up to this point. 08/30/18 on evaluation today patient actually appears to be doing much better in regard to her right lateral malleolus ulcer. She has been tolerating the dressing changes specifically the snap vac without complication although she did have some issues with the seal currently. Apparently there was some trouble with getting it to maintain over the past week past Sunday. Nonetheless overall the wound appears better in regard to the right lateral malleolus region. In regard to left lateral malleolus this actually show some signs of additional granulation although there still tendon noted in the base of the wound this appears to be healthy not necrotic in any way whatsoever. We are considering potentially using a snap vac for the left lateral malleolus as well the product wrap from [employer], [employer], was present in the clinic today we're going to see this patient I did have her come in with me after obtaining consent from the patient and her daughter in order to look at the wound and see if there's any recommendation one way or another as to whether or not they felt the snapback could be beneficial for the left lateral malleolus region. But the conclusion was that it might be but that this  is definitely a little bit deeper wound than what traditionally would be utilized for a snap vac. 09/06/18 on evaluation today patient actually appears to be doing excellent in my pinion in regard to both ankle ulcers. She has been tolerating the dressing changes without complication which is great news. Specifically we have been using the snap vac. In regard to the right ankle I'm not even sure that this is going to be necessary for today and following as the wound has filled in quite nicely. In regard to the left ankle I do believe that we're seeing excellent epithelialization from the edge as well as granulation in the central portion the tendon is still exposed but there's no evidence of necrotic bone and in general I feel like the patient has made excellent progress even compared to last week with just one week of the snap vac. 09/11/18; this is a patient who has wounds on her bilateral lateral malleoli. Initially both of these were deep stage IV wounds in the setting of chronic arterial insufficiency. She has been revascularized. As I understand think she been using snap vacs to both of these wounds however the area on the right became more superficial and currently she is only using it on the left. Using silver collagen on the right and silver collagen under the back on the left I believe 09/19/18 on evaluation today patient actually appears to be doing very well in regard to her lateral malleolus or ulcers bilaterally. She has been tolerating the dressing changes without complication. Fortunately there does not appear to be any evidence of infection at this time. Overall I feel like she is improving in an excellent manner  and I'm very pleased with the fact that everything seems to be turning towards the better for her. This has obviously been a long road. 09/27/18 on evaluation today patient actually appears to be doing very well in regard to her bilateral lateral malleolus ulcers. She has  been tolerating the dressing changes without complication. Fortunately there does not appear to be any evidence of infection at this time which is also great news. No fevers, chills, nausea, or vomiting noted at this time. Overall I feel like she is doing excellent with the snap vac on the left malleolus. She had 40 mL of fluid collection over the past week. 10/04/18 on evaluation today patient actually appears to be doing well in regard to her bilateral lateral malleolus ulcers. She continues to tolerate the dressing changes without complication. One issue that I see is the snap vac on the left lateral malleolus which appears to have sealed off some fluid underlying this area and has not really allowed it to heal to the degree that I would like to see. For that reason I did suggest at this point we may want to pack a small piece of packing strip into this region to allow it to more effectively wick out fluid. 10/11/18 in general the patient today does not feel that she has been doing very well. She's been a little bit lethargic and subsequently is having bodyaches as well according to what she tells me today. With that being said overall she has been concerned with the fact that something may be worsening although to be honest her wounds really have not been appearing poorly. She does have a new ulcer on her left heel unfortunately. This may be pressure related. Nonetheless it seems to me to have potentially started at least as a blister I do not see any evidence of deep tissue injury. In regard to the left ankle the snap vac still seems to be causing the ceiling off of the deeper part of the wound which is in turn trapping fluid. I'm not extremely pleased with the overall appearance as far as progress from last week to this week therefore I'm gonna discontinue the snap vac at this point. 10/18/18 patient unfortunately this point has not been feeling well for the past several days. She was seen by  Grayland Ormond her primary care provider who is a Librarian, academic at [facility]. Subsequently she states that she's been very weak and generally feeling malaise. No fevers, chills, nausea, or vomiting noted at this time. With that being said bloodwork was performed at the PCP office on the 11th of this month which showed a white blood cell count of 10.7. This was repeated today Sherman, [name] ([id number]) and shows a white blood cell count of 12.4. This does show signs of worsening. Coupled with the fact that she is feeling worse and that her left ankle wound is not really showing signs of improvement I feel like this is an indication that the osteomyelitis is likely exacerbating not improving. Overall I think we may also want to check her C-reactive protein and sedimentation rate. Actually did call [facility] office this afternoon while the patient was in the office here with me. Subsequently based on the findings we discussed treatment possibilities and I think that it is appropriate for [place] to go ahead and initiate treatment with doxycycline which I'm going to do. Subsequently he did agree to see about adding a CRP and sedimentation rate to her orders. If that  has not already been drawn to where they can run it they will contact the patient she can come back to have that check. They are in agreement with plan as far as the patient and her daughter are concerned. Nonetheless also think we need to get in touch with Dr. Henreitta Leber office to see about getting the patient scheduled with him as soon as possible. 11/08/18 on evaluation today patient presents for follow-up concerning her bilateral foot and ankle ulcers. I did do an extensive review of her chart in epic today. Subsequently she was seen by [name] he did initiate Cefepime IV antibiotic therapy. Subsequently she had some issues with her PICC line this had to be removed because it was coiled and then replaced. Fortunately  that was now settled. Unfortunately she has continued have issues with her left heel as well as the issues that she is experiencing with her bilateral lateral malleolus regions. I do believe however both areas seem to be doing a little bit better on evaluation today which is good news. No fevers, chills, nausea, or vomiting noted at this time. She actually has an angiogram schedule with Dr. dew on this coming Monday, November 11, 2018. Subsequently the patient states that she is feeling much better especially than what she was roughly 2 weeks ago. She actually had to cancel an appointment because she was feeling so poorly. No fevers, chills, nausea, or vomiting noted at this time. 11/15/18 on evaluation today patient actually is status post having had her angiogram with Dr. dew Monday, four days ago. 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 He had to go and work on several areas of the vasculature fortunately he was able to obtain no more than a 30% residual stenosis throughout post procedure. I reviewed this note today. I think this will definitely help with healing at this time. Fortunately there does not appear to be any signs of infection and I do feel like ratio already has a better appearance to it. 11/22/18 upon evaluation today patient actually appears to be doing very well in regard to her wounds in general. The right lateral malleolus looks excellent the heel looks better in the left lateral malleolus also appears to be doing a little better. With that being said the right second toe actually appears to be open and training we been watching this is been dry and stable but now is open. 12/03/2018 Seen today for follow-up and management of multiple bilateral lower extremity wounds. New pressure injury of the great toe which is closed at this time. Wound of the right distal second toe appears larger today with deep undermining and a pocket of fluid present within  the undermining region. Left and right malleolus is wounds are stable today with no signs and symptoms of infection.Denies any needs or concerns during exam today. 12/13/18 on evaluation today patient appears to be doing somewhat better in regard to her left heel ulcer. She also seems to be completely healed in regard to the right lateral malleolus ulcer. The left malleolus ulcer is smaller what unfortunately the wounds which are new over the first and second toes of the right foot are what are most concerning at this point especially the second. Both areas did require sharp debridement today. 12/20/18 on evaluation today patient's wound actually appears to be doing better in regard to left lateral ankle and her right lateral ankle continues to remain healed. The hill ulcer on the left  is improved. She does have improvement noted as well in regard to both toe ulcers. Overall I'm very pleased in this regard. No fevers, chills, nausea, or vomiting noted at this time. 12/23/18 on evaluation today patient is seen after she had her toenails trimmed at the podiatrist office due to issues with her right great toe. There was what appeared to be dark eschar on the surface of the wound which had her in the podiatrist concerned. Nonetheless as I remember that during the last office visit I had utilize silver nitrate of this area I was much less concerned about the situation. Subsequently I was able to clean off much of this tissue without any complication today. This does not appear to show any signs of infection and actually look somewhat better compared to last time post debridement. Her second toe on the right foot actually had callous over and there did appear still be some fluid underneath this that would require debridement today. 12/27/18 on evaluation today patient actually appears to be showing signs of improvement at all locations. Even the left lateral ankle although this is not quite as great as the  other sites. Fortunately there does not appear to be any signs of infection at this time and both of her toes on the right foot seem to be showing signs of improvement which is good news and very pleased in this regard. 01/03/19 on evaluation today patient appears to be doing better for the most part in regard to her wounds in particular. There Vera, [name] ([id number]) does not appear to be any evidence of infection at this time which is good news. Fortunately there is no sign of really worsening anywhere except for the right great toe which she does have what appears to be a bruise/deep tissue injury which is very superficial and already resolving. I'm not sure where this came from I questioned her extensively and she does not recall what may have happened with this. Other than that the patient seems to be doing well even the left lateral ankle ulcer looks good and is getting smaller. 01/10/19 on evaluation today patient appears to be doing well in regard to her left heel wound and both of her toe wounds. Overall I feel like there is definitely improvement here and I'm happy in that regard. With that being said unfortunately she is having issues with the left lateral malleolus ulcer which unfortunately still has a lot of depth to it. This is gonna be a very difficult wound for [place] to be able to truly get to heal. I may want to consider some type of skin substitute to see if this would be of benefit for her. I'll discuss this with her more the next visit most likely. This was something I thought about more at the end of the visit when I was Artie out of the room and the patient had been discharged. 01/17/19 on evaluation today patient appears to be doing very well in regard to her wounds in general. She's been making excellent progress at this time. Fortunately there's no sign of infection at this time either. No fevers, chills, nausea, or vomiting noted at this time. The biggest issue is still  her left lateral malleolus where it appears to be doing well and is getting smaller but still shows a small corner where this is deeper and goes down into what appears to be the joint space. Nonetheless this is taking much longer to heal although it still looks better in  smaller than previous evaluations. 01/24/19 on evaluation today patient's wounds actually appear to be doing rather well in general overall. She did require some sharp debridement in regard to the right great toe but everything else appears to be doing excellent no debridement was even necessary. No fevers, chills, nausea, or vomiting noted at this time. 01/31/19 on evaluation today patient actually appears to be doing much better in regard to her left foot wound on the heel as well as the ankle. The right great toe appears to be a little bit worse today this had callous over and trapped a lot of fluid underneath. Fortunately there's no signs of infection at any site which is great news. Electronic Signature(s) Signed: 01/31/2019 5:19:05 PM By: Worthy Keeler PA-C Entered By: Worthy Keeler on 01/31/2019 16:55:52 Hildebrandt, [name] ([id number]) -------------------------------------------------------------------------------- Physical Exam Details Patient Name: [name], [name] Date of Service: 01/31/2019 3:15 PM Medical Record Number: [id number] Patient Account Number: [id number] Date of Birth/Sex: Mar 21, 1923 (85 y.o. F) Treating RN: Harold Barban Primary Care Provider: Grayland Ormond Other Clinician: Referring Provider: Grayland Ormond Treating Provider/Extender: STONE III, Langston [place] Weeks in Treatment: 62 Constitutional Well-nourished and well-hydrated in no acute distress. Respiratory normal breathing without difficulty. Psychiatric this patient is able to make decisions and demonstrates good insight into disease process. Alert and Oriented x 3. pleasant and cooperative. Notes Patient's wound bed currently shows  evidence of good granulation in regard to the left lateral ankle as well as the left heel. There is excellent epithelialization noted at both locations as well. The main thing that I see right now is that the patient actually has issues with the right great toe where again post debridement this does appear to be larger and deeper than previous although again I think it will have a better chance of healing well at this time versus what it looked like when she came into the clinic today. Nonetheless the patient is somewhat frustrated in this regard. Electronic Signature(s) Signed: 01/31/2019 5:19:05 PM By: Worthy Keeler PA-C Entered By: Worthy Keeler on 01/31/2019 16:57:05 [name], [name] ([id number]) -------------------------------------------------------------------------------- Physician Orders Details Patient Name: [name] Date of Service: 01/31/2019 3:15 PM Medical Record Number: [id number] Patient Account Number: [id number] Date of Birth/Sex: 09-27-1923 (85 y.o. F) Treating RN: Harold Barban Primary Care Provider: Grayland Ormond Other Clinician: Referring Provider: Grayland Ormond Treating Provider/Extender: Melburn Hake, Yana Schorr Weeks in Treatment: 25 Verbal / Phone Orders: No Diagnosis Coding ICD-10 Coding Code Description 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, stage 4 L89.524 Pressure ulcer of left ankle, stage 4 L89.623 Pressure ulcer of left heel, stage 3 L89.892 Pressure ulcer of other site, stage 2 I70.233 Atherosclerosis of native arteries of right leg with ulceration of ankle I70.243 Atherosclerosis of native arteries of left leg with ulceration of ankle I89.0 Lymphedema, not elsewhere classified B35.4 Tinea corporis 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 o Cleanse wound with mild soap and water o May Shower, gently pat wound dry prior to  applying new dressing. Wound #3 Left Calcaneus o Clean wound with Normal Saline. o Cleanse wound with mild soap and water o May Shower, gently pat wound dry prior to applying new dressing. Wound #5 Right,Distal Toe Great o Clean wound with Normal Saline. o Cleanse wound with mild soap and water o May Shower, gently pat wound dry prior to applying new dressing. Anesthetic (add to Medication List) Wound #2 Left,Lateral Malleolus   o Topical Lidocaine 4% cream applied to wound bed prior to debridement (In Clinic Only). Wound #3 Left Calcaneus o Topical Lidocaine 4% cream applied to wound bed prior to debridement (In Clinic Only). Wound #5 Right,Distal Toe Great o Topical Lidocaine 4% cream applied to wound bed prior to debridement (In Clinic Only). Primary Wound Dressing Wound #2 Left,Lateral Malleolus o Silver Collagen - small piece of silver collagen to tunnel with iodoform packing over MOXIE, KALIL ([id number]) o Iodoform packing Gauze Wound #3 Left Calcaneus o Silver Collagen Wound #5 Right,Distal Toe Great o Silver Alginate Secondary Dressing Wound #2 Left,Lateral Malleolus o Boardered Foam Dressing - Please keep a bordered foam dressing on the right malleolus with bactroban Wound #3 Left Calcaneus o Boardered Foam Dressing - Please keep a bordered foam dressing on the right malleolus with bactroban Wound #5 Right,Distal Toe Great o Other - Coverlet or bandaid Dressing Change Frequency Wound #2 Left,Lateral Malleolus o Change Dressing Monday, Wednesday, Friday Wound #3 Left Calcaneus o Change Dressing Monday, Wednesday, Friday Wound #5 Right,Distal Toe Great o Change Dressing Monday, Wednesday, Friday Follow-up Appointments Wound #2 Left,Lateral Malleolus o Return Appointment in 1 week. Wound #3 Left Calcaneus o Return Appointment in 1 week. Wound #5 Right,Distal Toe Great o Return Appointment in 1 week. Edema  Control Wound #2 Left,Lateral Malleolus o Patient to wear own compression stockings o Patient to wear own Velcro compression garment. o Elevate legs to the level of the heart and pump ankles as often as possible Wound #3 Left Calcaneus o Patient to wear own compression stockings o Patient to wear own Velcro compression garment. o Elevate legs to the level of the heart and pump ankles as often as possible Off-Loading Wound #2 Left,Lateral Malleolus o Turn and reposition every 2 hours o Other: - do not put pressure on your ankles Wound #3 Left Calcaneus Hopfensperger, [name] ([id number]) o Turn and reposition every 2 hours o Other: - do not put pressure on your ankles Wound #5 Right,Distal Toe Great o Turn and reposition every 2 hours o Other: - do not put pressure on your ankles Additional Orders / Instructions Wound #2 Left,Lateral Malleolus o Increase protein intake. o Increase protein intake. Wound #3 Left Calcaneus o Increase protein intake. o Increase protein intake. Wound #5 Right,Distal Toe Great o Increase protein intake. o Increase protein intake. Home Health Wound #2 [place] Visits - [facility]. Please visit patient Monday, Wednesday, and Friday o Home Health Nurse may visit PRN to address patientos wound care needs. o FACE TO FACE ENCOUNTER: MEDICARE and MEDICAID PATIENTS: I certify that this patient is under my care and that I had a face-to-face encounter that meets the physician face-to-face encounter requirements with this patient on this date. The encounter with the patient was in whole or in part for the following MEDICAL CONDITION: (primary reason for [place]) MEDICAL NECESSITY: I certify, that based on my findings, NURSING services are a medically necessary home health service. HOME BOUND STATUS: I certify that my clinical findings support that this patient is homebound (i.e.,  Due to illness or injury, pt requires aid of supportive devices such as crutches, cane, wheelchairs, walkers, the use of special transportation or the assistance of another person to leave their place of residence. There is a normal inability to leave the home and doing so requires considerable and taxing effort. Other absences are for medical reasons / religious services and are infrequent or of short duration when for  other reasons). o If current dressing causes regression in wound condition, may D/C ordered dressing product/s and apply Normal Saline Moist Dressing daily until next [place] / Other MD appointment. [place] of regression in wound condition at [phone]. o Please direct any NON-WOUND related issues/requests for orders to patient's Primary Care Physician Wound #3 Left [place] Visits - [facility]. Please visit patient Monday, Wednesday, and Friday o Home Health Nurse may visit [facility]N to address patientos wound care needs. o FACE TO FACE ENCOUNTER: MEDICARE and MEDICAID PATIENTS: I certify that this patient is under my care and that I had a face-to-face encounter that meets the physician face-to-face encounter requirements with this patient on this date. The encounter with the patient was in whole or in part for the following MEDICAL CONDITION: (primary reason for [place]) MEDICAL NECESSITY: I certify, that based on my findings, NURSING services are a medically necessary home health service. HOME BOUND STATUS: I certify that my clinical findings support that this patient is homebound (i.e., Due to illness or injury, pt requires aid of supportive devices such as crutches, cane, wheelchairs, walkers, the use of special transportation or the assistance of another person to leave their place of residence. There is a normal inability to leave the home and doing so requires considerable and taxing effort. Other  absences are for medical reasons / religious services and are infrequent or of short duration when for other reasons). o If current dressing causes regression in wound condition, may D/C ordered dressing product/s and apply Normal Saline Moist Dressing daily until next [place] / Other MD appointment. [place] of regression in wound condition at [phone]. o Please direct any NON-WOUND related issues/requests for orders to patient's Primary Care Physician [name], [name] ([id number]) Wound #5 Right,Distal Toe [place] Visits [facility]. Please visit patient Monday, Wednesday, and Friday o Home Health Nurse may visit PRN to address patientos wound care needs. o FACE TO FACE ENCOUNTER: MEDICARE and MEDICAID PATIENTS: I certify that this patient is under my care and that I had a face-to-face encounter that meets the physician face-to-face encounter requirements with this patient on this date. The encounter with the patient was in whole or in part for the following MEDICAL CONDITION: (primary reason for [place]) MEDICAL NECESSITY: I certify, that based on my findings, NURSING services are a medically necessary home health service. HOME BOUND STATUS: I certify that my clinical findings support that this patient is homebound (i.e., Due to illness or injury, pt requires aid of supportive devices such as crutches, cane, wheelchairs, walkers, the use of special transportation or the assistance of another person to leave their place of residence. There is a normal inability to leave the home and doing so requires considerable and taxing effort. Other absences are for medical reasons / religious services and are infrequent or of short duration when for other reasons). o If current dressing causes regression in wound condition, may D/C ordered dressing product/s and apply Normal Saline Moist Dressing daily until next [place] / Other MD appointment. [place] of regression in wound condition at [phone]. o Please direct any NON-WOUND related issues/requests for orders to patient's Primary Care Physician Electronic Signature(s) Signed: 01/31/2019 5:19:05 PM By: Worthy Keeler PA-C Signed: 01/31/2019 5:19:20 PM By: Harold Barban Entered By: Harold Barban on 01/31/2019 16:31:59 Calvario, [name] ([id number]) -------------------------------------------------------------------------------- Problem List Details Patient Name: DZHGDJME,  [name]. Date of Service: 01/31/2019 3:15 PM Medical Record Number: [id number] Patient Account Number: [id number] Date of Birth/Sex: 08/29/1923 (85 y.o. 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 08/10/2017 No Yes ankle I70.243 Atherosclerosis of native arteries of left leg with ulceration of 07/10/2018 No Yes ankle I89.0 Lymphedema, not elsewhere classified 08/10/2017 No Yes B35.4 Tinea corporis 09/28/2017 No Yes 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, left ankle and foot 08/14/2018 No Yes Inactive Problems Resolved Problems ARNETT, GALINDEZ ([id number]) ICD-10 Code Description Active Date Resolved Date S90.822A Blister (nonthermal), left foot, initial encounter 10/11/2018 10/11/2018 Electronic Signature(s) Signed: 01/31/2019 5:19:05 PM By: Worthy Keeler PA-C Entered By: Worthy Keeler on  01/31/2019 15:36:51 [name], [name] ([id number]) -------------------------------------------------------------------------------- Progress Note Details Patient Name: [name] Date of Service: 01/31/2019 3:15 PM Medical Record Number: [id number] Patient Account Number: [id number] Date of Birth/Sex: 03-31-1923 (85 y.o. 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 Dr. Amalia Hailey the podiatrist saw her on 07/20/2017 and sent her to the wound center for possible hyperbaric oxygen therapy. past medical history of peripheral vascular disease, varicose veins, status post appendectomy, basal cell carcinoma excision from the left leg, cholecystectomy, pacemaker placement, right lower extremity angiography done by Dr. dew in March 2017 with placement of a stent. there is also note of a successful ablation of the right small saphenous vein done which was reviewed by ultrasound on 10/24/2016. the patient had a right small saphenous vein ablation done on 10/20/2016. The patient has never been a smoker. She has been seen by Dr. Corene Cornea dew the vascular surgeon who most recently saw her on 06/15/2017 for evaluation of ongoing problems with right leg swelling. She had a lower extremity arterial duplex examination done(02/13/17) which showed patent distal right superficial femoral artery stent and above-the-knee popliteal stent without evidence of restenosis. The ABI was more  than 1.3 on the right and more than 1.3 on the left. This was consistent with noncompressible arteries due to medial calcification. The right great toe pressure and PPG waveforms are within normal limits and the left great toe pressure and PPG waveforms are decreased. he recommended she continue to wear her compression stockings and continue with elevation. She is scheduled to have a noninvasive arterial study in the near future 08/16/2017 -- had a lower extremity arterial duplex examination done which showed patent distal right superficial femoral artery stent and above-the-knee popliteal stent without evidence of restenosis. The ABI was more than 1.3 on the right and more than 1.3 on the left. This was consistent with noncompressible arteries due to medial calcification. The right  great toe pressure and PPG waveforms are within normal limits and the left great toe pressure and PPG waveforms are decreased. the x-ray of the right ankle has not yet been done 08/24/2017 -- had a right ankle x-ray -- IMPRESSION:1. No fracture, bone lesion or evidence of osteomyelitis. 2. Lateral soft tissue swelling with a soft tissue ulcer. she has not yet seen the vascular surgeon for review 08/31/17 on evaluation today patient's wound appears to be showing signs of improvement. She still with her appointment with vascular in order to review her results of her vascular study and then determine if any intervention would be recommended at that time. No fevers, chills, nausea, or vomiting noted at this time. She has been tolerating the dressing changes without complication. 09/28/17 on evaluation today patient's wound appears to show signs of good improvement in regard to the granulation tissue which is surfacing. There is still a layer of slough covering the wound and the posterior portion is still significantly deeper than the anterior nonetheless there has been some good sign of things moving towards the better. She  is going to go back to Dr. dew for reevaluation to ensure her blood flow is still appropriate. That will be before her next evaluation with [place] next week. No fevers, chills, nausea, or vomiting noted at this time. Patient does have some discomfort rated to be a 3-4/10 depending on activity specifically cleansing the wound makes it worse. 10/05/2017 -- the patient was seen by Dr. Lucky Cowboy last week and noninvasive studies showed a normal right ABI with brisk JAYMA, VOLPI. ([id number]) triphasic waveforms consistent with no arterial insufficiency including normal digital pressures. The duplex showed a patent distal right SFA stent and the proximal SFA was also normal. He was pleased with her test and thought she should have enough of perfusion for normal wound healing. He would see her back in 6 months time. 12/21/17 on evaluation today patient appears to be doing fairly well in regard to her right lateral ankle wound. Unfortunately the main issue that she is expansion at this point is that she is having some issues with what appears to be some cellulitis in the right anterior shin. She has also been noting a little bit of uncomfortable feeling especially last night and her ankle area. I'm afraid that she made the developing a little bit of an infection. With that being said I think it is in the early stages. 12/28/17 on evaluation today patient's ankle appears to be doing excellent. She's making good progress at this point the cellulitis seems to have improved after last week's evaluation. Overall she is having no significant discomfort which is excellent news. She does have an appointment with Dr. dew on March 29, 2018 for reevaluation in regard to the stent he placed. She seems to have excellent blood flow in the right lower extremity. 01/19/12 on evaluation today patient's wound appears to be doing very well. In fact she does not appear to require debridement at this point, there's no evidence of  infection, and overall from the standpoint of the wound she seems to be doing very well. With that being said I believe that it may be time to switch to different dressing away from the [facility] Dressing she tells me she does have a lot going on her friend actually passed away yesterday and she's also having a lot of issues with her husband this obviously is weighing heavy on her as far as your thoughts and concerns today. 01/25/18  on evaluation today patient appears to be doing fairly well in regard to her right lateral malleolus. She has been tolerating the dressing changes without complication. Overall I feel like this is definitely showing signs of improvement as far as how the overall appearance of the wound is there's also evidence of epithelium start to migrate over the granulation tissue. In general I think that she is progressing nicely as far as the wound is concerned. The only concern she really has is whether or not we can switch to every other week visits in order to avoid having as many appointments as her daughters have a difficult time getting her to her appointments as well as the patient's husband to his he is not doing very well at this point. 02/22/18 on evaluation today patient's right lateral malleolus ulcer appears to be doing great. She has been tolerating the dressing changes without complication. Overall you making excellent progress at this time. Patient is having no significant discomfort. 03/15/18 on evaluation today patient appears to be doing much more poorly in regard to her right lateral ankle ulcer at this point. Unfortunately since have last seen her her husband has passed just a few days ago is obviously weighed heavily on her her daughter also had surgery well she is with her today as usual. There does not appear to be any evidence of infection she does seem to have significant contusion/deep tissue injury to the right lateral malleolus which was not noted  previous when I saw her last. It's hard to tell of exactly when this injury occurred although during the time she was spending the night in the hospital this may have been most likely. 03/22/18 on evaluation today patient appears to actually be doing very well in regard to her ulcer. She did unfortunately have a setback which was noted last week however the good news is we seem to be getting back on track and in fact the wound in the core did still have some necrotic tissue which will be addressed at this point today but in general I'm seeing signs that things are on the up and up. She is glad to hear this obviously she's been somewhat concerned that due to the how her wound digressed more recently. 03/29/18 on evaluation today patient appears to be doing fairly well in regard to her right lower extremity lateral malleolus ulcer. She unfortunately does have a new area of pressure injury over the inferior portion where the wound has opened up a little bit larger secondary to the pressure she seems to be getting. She does tell me sometimes when she sleeps at night that it actually hurts and does seem to be pushing on the area little bit more unfortunately. There does not appear to be any evidence of infection which is good news. She has been tolerating the dressing changes without complication. She also did have some bruising in the left second and third toes due to the fact that she may have bump this or injured it although she has neuropathy so she does not feel she did move recently that may have been where this came from. Nonetheless there does not appear to be any evidence of infection at this time. 04/12/18 on evaluation today patient's wound on the right lateral ankle actually appears to be doing a little bit better with a lot of necrotic docking tissue centrally loosening up in clearing away. However she does have the beginnings of a deep tissue injury on the left lateral malleolus  likely due to  the fact we've been trying offload the right as much as we have. I think she may benefit from an assistive soft device to help with offloading and it looks like they're looking at one of the doughnut conditions that wraps around the lower leg to offload which I think will definitely do a good job. With that being said I think we definitely need to address this issue on the left before it becomes a wound. Patient is not having significant pain. ANGELIN, CUTRONE ([id number]) 04/19/18 on evaluation today patient appears to be doing excellent in regard to the progress she's made with her right lateral ankle ulcer. The left ankle region which did show evidence of a deep tissue injury seems to be resolving there's little fluid noted underneath and a blister there's nothing open at this point in time overall I feel like this is progressing nicely which is good news. She does not seem to be having significant discomfort at this point which is also good news. 04/25/18-She is here in follow up evaluation for bilateral lateral malleolar ulcers. The right lateral malleolus ulcer with pale subcutaneous tissue exposure, central area of ulcer with tendon/periosteum exposed. The left lateral malleolus ulcer now with central area of nonviable tissue, otherwise deep tissue injury. She is wearing compression wraps to the left lower extremity, she will place the right lower extremity compression wraps on when she gets home. She will be out of town over the weekend and return next week and follow-up appointment. She completed her doxycycline this morning 05/03/18 on evaluation today patient appears to be doing very well in regard to her right lateral ankle ulcer in general. At least she's showing some signs of improvement in this regard. Unfortunately she has some additional injury to the left lateral malleolus region which appears to be new likely even over the past several days. Again this determination is based on  the overall appearance. With that being said the patient is obviously frustrated about this currently. 05/10/18-She is here in follow-up evaluation for bilateral lateral malleolar ulcers. She states she has purchased offloading shoes/boots and they will arrive tomorrow. She was asked to bring them in the office at next week's appointment so her provider is aware of product being utilized. She continues to sleep on right or left side, she has been encouraged to sleep on her back. The right lateral malleolus ulcer is precariously close to peri-osteum; will order xray. The left lateral malleolus ulcer is improved. Will switch back to santyl; she will follow up next week. 05/17/18 on evaluation today patient actually appears to be doing very well in regard to her malleolus her ulcers compared to last time I saw them. She does not seem to have as much in the way of contusion at this point which is great news. With that being said she does continue to have discomfort and I do believe that she is still continuing to benefit from the offloading/pressure reducing boots that were recommended. I think this is the key to trying to get this to heal up completely. 05/24/18 on evaluation today patient actually appears to be doing worse at this point in time unfortunately compared to her last week's evaluation. She is having really no increased pain which is good news unfortunately she does have more maceration in your theme and noted surrounding the right lateral ankle the left lateral ankle is not really is erythematous I do not see signs of the overt cellulitis on that side.  Unfortunately the wounds do not seem to have shown any signs of improvement since the last evaluation. She also has significant swelling especially on the right compared to previous some of this may be due to infection however also think that she may be served better while she has these wounds by compression wrapping versus continuing to use the  Juxta-Lite for the time being. Especially with the amount of drainage that she is experiencing at this point. No fevers, chills, nausea, or vomiting noted at this time. 05/31/18 on evaluation today patient appears to actually be doing better in regard to her right lateral lower extremity ulcer specifically on the malleolus region. She has been tolerating the antibiotic without complication. With that being said she still continues to have issues but a little bit of redness although nothing like she what she was experiencing previous. She still continues to pressure to her ankle area she did get the problem on offloading boots unfortunately she will not wear them she states there too uncomfortable and she can't get in and out of the bed. Nonetheless at this point her wounds seem to be continually getting worse which is not what we want I'm getting somewhat concerned about her progress and how things are going to proceed if we do not intervene in some way shape or form. I therefore had a very lengthy conversation today about offloading yet again and even made a specific suggestion for switching her to a memory foam mattress and even gave the information for a specific one that they could look at getting if it was something that they were interested in considering. She does not want to be considered for a hospital bed air mattress although honestly insurance would not cover it that she does not have any wounds on her trunk. 06/14/18 on evaluation today both wounds over the bilateral lateral malleolus her ulcers appear to be doing better there's no evidence of pressure injury at this point. She did get the foam mattress for her bed and this does seem to have been extremely beneficial for her in my pinion. Her daughter states that she is having difficulty getting out of bed because of how soft it is. The patient also relates this to be. Nonetheless I do feel like she's actually doing better. Unfortunately  right after and around the time she was getting the mattress she also sustained a fall when she got up to go pick up the phone and ended up injuring her right elbow she has 18 sutures in place. We are not caring for this currently although home health is going to be taking the sutures out shortly. Nonetheless this may be something that we need to evaluate going forward. It depends on how well it has or has not healed in the end. She also recently saw an orthopedic specialist for an injection in the right shoulder just before her fall unfortunately the fall seems to have worsened her pain. 06/21/18 on evaluation today patient appears to be doing about the same in regard to her lateral malleolus ulcers. Both appear to be just a little bit deeper but again we are clinging away the necrotic and dead tissue which I think is why this is progressing towards a deeper realm as opposed to improving from my measurement standpoint in that regard. Nonetheless she has been tolerating the dressing changes she absolutely hates the memory foam mattress topper that was obtained for her nonetheless [name], [name] ([id number]) I do believe this is still doing  excellent as far as taking care of excess pressure in regard to the lateral malleolus regions. She in fact has no pressure injury that I see whereas in weeks past it was week by week I was constantly seeing new pressure injuries. Overall I think it has been very beneficial for her. 07/03/18; patient arrives in my clinic today. She has deep punched out areas over her bilateral lateral malleoli. The area on the right has some more depth. We spent a lot of time today talking about pressure relief for these areas. This started when her daughter asked for a prescription for a memory foam mattress. I have never written a prescription for a mattress and I don't think insurances would pay for that on an ordinary bed. In any case he came up that she has foam boots that  she refuses to wear. I would suggest going to these before any other offloading issues when she is in bed. They say she is meticulous about offloading this the rest of the day 07/10/18- She is seen in follow-up evaluation for bilateral, lateral malleolus ulcers. There is no improvement in the ulcers. She has purchased and is sleeping on a memory foam mattress/overlay, she has been using the offloading boots nightly over the past week. She has a follow up appointment with vascular medicine at the end of October, in my opinion this follow up should be expedited given her deterioration and suboptimal TBI results. We will order plain film xray of the left ankle as deeper structures are palpable; would consider having MRI, regardless of xray report(s). The ulcers will be treated with iodoflex/iodosorb, she is unable to safely change the dressings daily with santyl. 07/19/18 on evaluation today patient appears to be doing in general visually well in regard to her bilateral lateral malleolus ulcers. She has been tolerating the dressing changes without complication which is good news. With that being said we did have an x-ray performed on 07/12/18 which revealed a slight loosen see in the lateral portion of the distal left fibula which may represent artifact but underline lytic destruction or osteomyelitis could not be excluded. MRI was recommended. With that being said we can see about getting the patient scheduled for an MRI to further evaluate this area. In fact we have that scheduled currently for August 20 19,019. 07/26/18 on evaluation today patient's wound on the right lateral ankle actually appears to be doing fairly well at this point in my pinion. She has made some good progress currently. With that being said unfortunately in regard to the left lateral ankle ulcer this seems to be a little bit more problematic at this time. In fact as I further evaluated the situation she actually had bone exposed  which is the first time that's been the case in the bone appear to be necrotic. Currently I did review patient's note from Dr. Bunnie Domino office with [facility]. He stated that ABI was 1.26 on the right and 0.95 on the left with good waveforms. Her perfusion is stable not reduced from previous studies and her digital waveforms were pretty good particularly on the right. His conclusion upon review of the note was that there was not much she could do to improve her perfusion and he felt she was adequate for wound healing. His suggestion was that she continued to see [place] and consider a synthetic skin graft if there was no underlying infection. He plans to see her back in six months or as needed. 08/01/18 on  evaluation today patient appears to be doing better in regard to her right lateral ankle ulcer. Her left lateral ankle ulcer is about the same she still has bone involvement in evidence of necrosis. There does not appear to be evidence of infection at this time On the right lateral lower extremity. I have started her on the Augmentin she picked this up and started this yesterday. This is to get her through until she sees infectious disease which is scheduled for 08/12/18. 08/06/18 on evaluation today patient appears to be doing rather well considering my discussion with patient's daughter at the end of last week. The area which was marked where she had erythema seems to be improved and this is good news. With that being said overall the patient seems to be making good improvement when it comes to the overall appearance of the right lateral ankle ulcer although this has been slow she at least is coming around in this regard. Unfortunately in regard to the left lateral ankle ulcer this is osteomyelitis based on the pathology report as well is bone culture. Nonetheless we are still waiting CT scan. Unfortunately the MRI we originally ordered cannot be performed as the patient is a pacemaker  which I had overlooked. Nonetheless we are working on the CT scan approval and scheduling as of now. She did go to the hospital over the weekend and was placed on IV Cefzo for a couple of days. Fortunately this seems to have improved the erythema quite significantly which is good news. There does not appear to be any evidence of worsening infection at this time. She did have some bleeding after the last debridement therefore I did not perform any sharp debridement in regard to left lateral ankle at this point. Patient has been approved for a snap vac for the right lateral ankle. 08/14/18; the patient with wounds over her bilateral lateral malleoli. The area on the right actually looks quite good. Been using a snap back on this area. Healthy granulation and appears to be filling in. Unfortunately the area on the left is really problematic. 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 Also noted to have cellulitis. She saw Dr. Novella Olive of infectious disease today and was put on linezolid. We are able to verify this with her pharmacy. She is completed the Augmentin that she was already on. We've been using Iodoflex to this [name], [name] ([id number]) area 08/23/18 on evaluation today patient's wounds both actually appear to be doing better compared to my prior evaluations. Fortunately she showing signs of good improvement in regard to the overall wound status especially where were using the snap vac on the right. In regard to left lateral malleolus the wound bed actually appears to be much cleaner than previously noted. I do not feel any phone directly probed during evaluation today and though there is tendon noted this does not appear to be necrotic it's actually fairly good as far as the overall appearance of the tendon is concerned. In general the wound bed actually appears to be doing significantly better than it was  previous. Patient is currently in the care of [name] and I did review that note today. He actually has her on two weeks of linezolid and then following the patient will be on 1-2 months of Keflex. That is the plan currently. She has been on antibiotics therapy as prescribed by myself initially starting on July 30, 2018 and has been on that continuously up to this point. 08/30/18 on evaluation today patient actually appears to be doing much better in regard to her right lateral malleolus ulcer. She has been tolerating the dressing changes specifically the snap vac without complication although she did have some issues with the seal currently. Apparently there was some trouble with getting it to maintain over the past week past Sunday. Nonetheless overall the wound appears better in regard to the right lateral malleolus region. In regard to left lateral malleolus this actually show some signs of additional granulation although there still tendon noted in the base of the wound this appears to be healthy not necrotic in any way whatsoever. We are considering potentially using a snap vac for the left lateral malleolus as well the product wrap from [employer], Elida, was present in the clinic today we're going to see this patient I did have her come in with me after obtaining consent from the patient and her daughter in order to look at the wound and see if there's any recommendation one way or another as to whether or not they felt the snapback could be beneficial for the left lateral malleolus region. But the conclusion was that it might be but that this is definitely a little bit deeper wound than what traditionally would be utilized for a snap vac. 09/06/18 on evaluation today patient actually appears to be doing excellent in my pinion in regard to both ankle ulcers. She has been tolerating the dressing changes without complication which is great news. Specifically we have been using the snap vac. In  regard to the right ankle I'm not even sure that this is going to be necessary for today and following as the wound has filled in quite nicely. In regard to the left ankle I do believe that we're seeing excellent epithelialization from the edge as well as granulation in the central portion the tendon is still exposed but there's no evidence of necrotic bone and in general I feel like the patient has made excellent progress even compared to last week with just one week of the snap vac. 09/11/18; this is a patient who has wounds on her bilateral lateral malleoli. Initially both of these were deep stage IV wounds in the setting of chronic arterial insufficiency. She has been revascularized. As I understand think she been using snap vacs to both of these wounds however the area on the right became more superficial and currently she is only using it on the left. Using silver collagen on the right and silver collagen under the back on the left I believe 09/19/18 on evaluation today patient actually appears to be doing very well in regard to her lateral malleolus or ulcers bilaterally. She has been tolerating the dressing changes without complication. Fortunately there does not appear to be any evidence of infection at this time. Overall I feel like she is improving in an excellent manner and I'm very pleased with the fact that everything seems to be turning towards the better for her. This has obviously been a long road. 09/27/18 on evaluation today patient actually appears to be doing very well in regard to her bilateral lateral malleolus ulcers. She has been tolerating the dressing changes without complication. Fortunately there does not appear to be any evidence of infection at this time which is also great news. No fevers, chills, nausea, or vomiting noted at this time. Overall I feel like she is doing excellent with the snap vac on  the left malleolus. She had 40 mL of fluid collection over the past  week. 10/04/18 on evaluation today patient actually appears to be doing well in regard to her bilateral lateral malleolus ulcers. She continues to tolerate the dressing changes without complication. One issue that I see is the snap vac on the left lateral malleolus which appears to have sealed off some fluid underlying this area and has not really allowed it to heal to the degree that I would like to see. For that reason I did suggest at this point we may want to pack a small piece of packing strip into this region to allow it to more effectively wick out fluid. 10/11/18 in general the patient today does not feel that she has been doing very well. She's been a little bit lethargic and subsequently is having bodyaches as well according to what she tells me today. With that being said overall she has been concerned with the fact that something may be worsening although to be honest her wounds really have not been appearing poorly. She does have a new ulcer on her left heel unfortunately. This may be pressure related. Nonetheless it seems to me to have potentially started at least as a blister I do not see any evidence of deep tissue injury. In regard to the left ankle the snap vac still seems to be causing the ceiling off of the deeper part of the wound which is in turn trapping fluid. I'm not extremely pleased with the overall appearance as far as progress from last week to this week therefore I'm gonna discontinue Coor, [name] ([id number]) the snap vac at this point. 10/18/18 patient unfortunately this point has not been feeling well for the past several days. She was seen by Grayland Ormond her primary care provider who is a Librarian, academic at [facility]. Subsequently she states that she's been very weak and generally feeling malaise. No fevers, chills, nausea, or vomiting noted at this time. With that being said bloodwork was performed at the PCP office on the 11th of this month which  showed a white blood cell count of 10.7. This was repeated today and shows a white blood cell count of 12.4. This does show signs of worsening. Coupled with the fact that she is feeling worse and that her left ankle wound is not really showing signs of improvement I feel like this is an indication that the osteomyelitis is likely exacerbating not improving. Overall I think we may also want to check her C-reactive protein and sedimentation rate. Actually did call [facility] office this afternoon while the patient was in the office here with me. Subsequently based on the findings we discussed treatment possibilities and I think that it is appropriate for [place] to go ahead and initiate treatment with doxycycline which I'm going to do. Subsequently he did agree to see about adding a CRP and sedimentation rate to her orders. If that has not already been drawn to where they can run it they will contact the patient she can come back to have that check. They are in agreement with plan as far as the patient and her daughter are concerned. Nonetheless also think we need to get in touch with Dr. Henreitta Leber office to see about getting the patient scheduled with him as soon as possible. 11/08/18 on evaluation today patient presents for follow-up concerning her bilateral foot and ankle ulcers. I did do an extensive review of her chart in epic today. Subsequently she was  seen by [name] he did initiate Cefepime IV antibiotic therapy. Subsequently she had some issues with her PICC line this had to be removed because it was coiled and then replaced. Fortunately that was now settled. Unfortunately she has continued have issues with her left heel as well as the issues that she is experiencing with her bilateral lateral malleolus regions. I do believe however both areas seem to be doing a little bit better on evaluation today which is good news. No fevers, chills, nausea, or vomiting noted at this time. She actually  has an angiogram schedule with Dr. dew on this coming Monday, November 11, 2018. Subsequently the patient states that she is feeling much better especially than what she was roughly 2 weeks ago. She actually had to cancel an appointment because she was feeling so poorly. No fevers, chills, nausea, or vomiting noted at this time. 11/15/18 on evaluation today patient actually is status post having had her angiogram with Dr. dew Monday, four days ago. 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 He had to go and work on several areas of the vasculature fortunately he was able to obtain no more than a 30% residual stenosis throughout post procedure. I reviewed this note today. I think this will definitely help with healing at this time. Fortunately there does not appear to be any signs of infection and I do feel like ratio already has a better appearance to it. 11/22/18 upon evaluation today patient actually appears to be doing very well in regard to her wounds in general. The right lateral malleolus looks excellent the heel looks better in the left lateral malleolus also appears to be doing a little better. With that being said the right second toe actually appears to be open and training we been watching this is been dry and stable but now is open. 12/03/2018 Seen today for follow-up and management of multiple bilateral lower extremity wounds. New pressure injury of the great toe which is closed at this time. Wound of the right distal second toe appears larger today with deep undermining and a pocket of fluid present within the undermining region. Left and right malleolus is wounds are stable today with no signs and symptoms of infection.Denies any needs or concerns during exam today. 12/13/18 on evaluation today patient appears to be doing somewhat better in regard to her left heel ulcer. She also seems to be completely healed in regard to the right lateral malleolus ulcer. The  left malleolus ulcer is smaller what unfortunately the wounds which are new over the first and second toes of the right foot are what are most concerning at this point especially the second. Both areas did require sharp debridement today. 12/20/18 on evaluation today patient's wound actually appears to be doing better in regard to left lateral ankle and her right lateral ankle continues to remain healed. The hill ulcer on the left is improved. She does have improvement noted as well in regard to both toe ulcers. Overall I'm very pleased in this regard. No fevers, chills, nausea, or vomiting noted at this time. 12/23/18 on evaluation today patient is seen after she had her toenails trimmed at the podiatrist office due to issues with her right great toe. There was what appeared to be dark eschar on the surface of the wound which had her in the podiatrist concerned. Nonetheless as I remember that during the last office visit I had utilize silver nitrate of this area  I was much less concerned about the situation. Subsequently I was able to clean off much of this tissue without any complication today. This does not appear to show any signs of infection and actually look somewhat better compared to last time post debridement. Her second toe on the right foot actually had callous over and there did appear still be some fluid underneath this that would require debridement today. CHASTIN, GARLITZ ([id number]) 12/27/18 on evaluation today patient actually appears to be showing signs of improvement at all locations. Even the left lateral ankle although this is not quite as great as the other sites. Fortunately there does not appear to be any signs of infection at this time and both of her toes on the right foot seem to be showing signs of improvement which is good news and very pleased in this regard. 01/03/19 on evaluation today patient appears to be doing better for the most part in regard to her wounds in  particular. There does not appear to be any evidence of infection at this time which is good news. Fortunately there is no sign of really worsening anywhere except for the right great toe which she does have what appears to be a bruise/deep tissue injury which is very superficial and already resolving. I'm not sure where this came from I questioned her extensively and she does not recall what may have happened with this. Other than that the patient seems to be doing well even the left lateral ankle ulcer looks good and is getting smaller. 01/10/19 on evaluation today patient appears to be doing well in regard to her left heel wound and both of her toe wounds. Overall I feel like there is definitely improvement here and I'm happy in that regard. With that being said unfortunately she is having issues with the left lateral malleolus ulcer which unfortunately still has a lot of depth to it. This is gonna be a very difficult wound for [place] to be able to truly get to heal. I may want to consider some type of skin substitute to see if this would be of benefit for her. I'll discuss this with her more the next visit most likely. This was something I thought about more at the end of the visit when I was Artie out of the room and the patient had been discharged. 01/17/19 on evaluation today patient appears to be doing very well in regard to her wounds in general. She's been making excellent progress at this time. Fortunately there's no sign of infection at this time either. No fevers, chills, nausea, or vomiting noted at this time. The biggest issue is still her left lateral malleolus where it appears to be doing well and is getting smaller but still shows a small corner where this is deeper and goes down into what appears to be the joint space. Nonetheless this is taking much longer to heal although it still looks better in smaller than previous evaluations. 01/24/19 on evaluation today patient's wounds actually  appear to be doing rather well in general overall. She did require some sharp debridement in regard to the right great toe but everything else appears to be doing excellent no debridement was even necessary. No fevers, chills, nausea, or vomiting noted at this time. 01/31/19 on evaluation today patient actually appears to be doing much better in regard to her left foot wound on the heel as well as the ankle. The right great toe appears to be a little bit worse today this  had callous over and trapped a lot of fluid underneath. Fortunately there's no signs of infection at any site which is great news. Patient History Information obtained from Patient. Family History Cancer - Father,Siblings, Heart Disease - Siblings, No family history of Diabetes, Hereditary Spherocytosis, Hypertension, Kidney Disease, Lung Disease, Seizures, Stroke, Thyroid Problems, Tuberculosis. Social History Never smoker, Marital Status - Married, Alcohol Use - Never, Drug Use - No History, Caffeine Use - Rarely. Medical History Eyes Patient has history of Cataracts - surgery Cardiovascular Patient has history of Congestive Heart Failure, Hypertension Musculoskeletal Patient has history of Osteoarthritis Neurologic Patient has history of Neuropathy Oncologic Denies history of Received Chemotherapy, Received Radiation Review of Systems (ROS) Constitutional Symptoms (General Health) Denies complaints or symptoms of Fever, Chills. LOREAN, EKSTRAND ([id number]) Respiratory The patient has no complaints or symptoms. Cardiovascular The patient has no complaints or symptoms. Psychiatric The patient has no complaints or symptoms. Objective Constitutional Well-nourished and well-hydrated in no acute distress. Vitals Time Taken: 3:41 PM, Height: 65 in, Weight: 154.3 lbs, BMI: 25.7, Temperature: 98.3 F, Pulse: 85 bpm, Respiratory Rate: 16 breaths/min, Blood Pressure: 135/74 mmHg. Respiratory normal breathing  without difficulty. Psychiatric this patient is able to make decisions and demonstrates good insight into disease process. Alert and Oriented x 3. pleasant and cooperative. General Notes: Patient's wound bed currently shows evidence of good granulation in regard to the left lateral ankle as well as the left heel. There is excellent epithelialization noted at both locations as well. The main thing that I see right now is that the patient actually has issues with the right great toe where again post debridement this does appear to be larger and deeper than previous although again I think it will have a better chance of healing well at this time versus what it looked like when she came into the clinic today. Nonetheless the patient is somewhat frustrated in this regard. Integumentary (Hair, Skin) Wound #2 status is Open. Original cause of wound was Gradually Appeared. The wound is located on the Left,Lateral Malleolus. The wound measures 0.7cm length x 0.6cm width x 0.2cm depth; 0.33cm^2 area and 0.066cm^3 volume. There is Fat Layer (Subcutaneous Tissue) Exposed exposed. There is no undermining noted, however, there is tunneling at 5:00 with a maximum distance of 1.3cm. There is a medium amount of serous drainage noted. The wound margin is flat and intact. There is large (67-100%) red granulation within the wound bed. There is no necrotic tissue within the wound bed. The periwound skin appearance did not exhibit: Callus, Crepitus, Excoriation, Induration, Rash, Scarring, Dry/Scaly, Maceration, Atrophie Blanche, Cyanosis, Ecchymosis, Hemosiderin Staining, Mottled, Pallor, Rubor, Erythema. Periwound temperature was noted as No Abnormality. Wound #3 status is Open. Original cause of wound was Gradually Appeared. The wound is located on the Left Calcaneus. The wound measures 0.7cm length x 0.7cm width x 0.1cm depth; 0.385cm^2 area and 0.038cm^3 volume. There is Fat Layer (Subcutaneous Tissue) Exposed  exposed. There is no tunneling or undermining noted. There is a small amount of serous drainage noted. The wound margin is indistinct and nonvisible. There is small (1-33%) pink granulation within the wound bed. There is a medium (34-66%) amount of necrotic tissue within the wound bed including Adherent Slough. The periwound skin appearance exhibited: Dry/Scaly, Maceration. The periwound skin appearance did not exhibit: Callus, Crepitus, Excoriation, Induration, Rash, Scarring, Atrophie Blanche, Cyanosis, Ecchymosis, Hemosiderin Staining, Mottled, Pallor, Rubor, Erythema. Periwound temperature was noted as No Abnormality. The periwound has tenderness on palpation. Wound #5 status is  Open. Original cause of wound was Footwear Injury. The wound is located on the Right,Distal [employer] The wound measures 0.2cm length x 0.2cm width x 0.8cm depth; 0.031cm^2 area and 0.025cm^3 volume. There is Fat Layer ELIM, PEALE ([id number]) (Subcutaneous Tissue) Exposed exposed. There is no tunneling or undermining noted. There is a large amount of serous drainage noted. The wound margin is flat and intact. There is no granulation within the wound bed. There is a large (67-100%) amount of necrotic tissue within the wound bed including Eschar. The periwound skin appearance exhibited: Ecchymosis. The periwound skin appearance did not exhibit: Callus, Crepitus, Excoriation, Induration, Rash, Scarring, Dry/Scaly, Maceration, Atrophie Blanche, Cyanosis, Hemosiderin Staining, Mottled, Pallor, Rubor, Erythema. 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 There was a Excisional Skin/Subcutaneous Tissue Debridement with a total area of 0.04 sq cm performed by STONE III, Merdis Snodgrass E., PA-C. With the following instrument(s): Curette to remove Viable and Non-Viable tissue/material. Material removed includes Callus, Subcutaneous Tissue, and Slough after achieving pain control using Lidocaine. No specimens were taken. A time out was conducted at 16:25, prior to the start of the procedure. There was no bleeding. The procedure was tolerated well with a pain level of 0 throughout and a pain level of 0 following the procedure. Post Debridement Measurements: 0.2cm length x 0.2cm width x 0.8cm depth; 0.025cm^3 volume. Post debridement Stage noted as Category/Stage II. Character of Wound/Ulcer Post Debridement is improved. Post procedure Diagnosis Wound #5: Same as Pre-Procedure Plan Wound Cleansing: Wound #2 Left,Lateral Malleolus: Clean wound with Normal Saline. Cleanse wound with mild soap and water May Shower, gently pat wound dry prior to applying new dressing. Wound #3 Left Calcaneus: Clean wound with Normal Saline. Cleanse wound with mild soap and water May Shower, gently pat wound dry prior to applying new dressing. ANYELI, [place] ([id number]) Wound #5 Right,Distal Toe Great: Clean wound with Normal Saline. Cleanse wound with mild soap and water May Shower, gently pat wound dry prior to applying new dressing. Anesthetic (add to Medication List): Wound #2 Left,Lateral Malleolus: Topical Lidocaine 4% cream applied to wound bed prior to debridement (In Clinic Only). Wound #3 Left Calcaneus: Topical Lidocaine 4% cream applied to wound bed prior to debridement (In Clinic Only). Wound #5 Right,Distal Toe Great: Topical Lidocaine 4% cream applied to wound bed prior to debridement (In Clinic  Only). Primary Wound Dressing: Wound #2 Left,Lateral Malleolus: Silver Collagen - small piece of silver collagen to tunnel with iodoform packing over Iodoform packing Gauze Wound #3 Left Calcaneus: Silver Collagen Wound #5 Right,Distal Toe Great: Silver Alginate Secondary Dressing: Wound #2 Left,Lateral Malleolus: Boardered Foam Dressing - Please keep a bordered foam dressing on the right malleolus with bactroban Wound #3 Left Calcaneus: Boardered Foam Dressing - Please keep a bordered foam dressing on the right malleolus with bactroban Wound #5 Right,Distal Toe Great: Other - Coverlet or bandaid Dressing Change Frequency: Wound #2 Left,Lateral Malleolus: Change Dressing Monday, Wednesday, Friday Wound #3 Left Calcaneus: Change Dressing Monday, Wednesday, Friday Wound #5 Right,Distal Toe Great: Change Dressing  Monday, Wednesday, Friday Follow-up Appointments: Wound #2 Left,Lateral Malleolus: Return Appointment in 1 week. Wound #3 Left Calcaneus: Return Appointment in 1 week. Wound #5 Right,Distal Toe Great: Return Appointment in 1 week. Edema Control: Wound #2 Left,Lateral Malleolus: Patient to wear own compression stockings Patient to wear own Velcro compression garment. Elevate legs to the level of the heart and pump ankles as often as possible Wound #3 Left Calcaneus: Patient to wear own compression stockings Patient to wear own Velcro compression garment. Elevate legs to the level of the heart and pump ankles as often as possible Off-Loading: Wound #2 Left,Lateral Malleolus: Turn and reposition every 2 hours Other: - do not put pressure on your ankles Wound #3 Left Calcaneus: Turn and reposition every 2 hours Other: - do not put pressure on your ankles Wound #5 Right,Distal Toe Great: Turn and reposition every 2 hours Other: - do not put pressure on your ankles Additional Orders / Instructions: MYKAL, KIRCHMAN ([id number]) Wound #2 Left,Lateral  Malleolus: Increase protein intake. Increase protein intake. Wound #3 Left Calcaneus: Increase protein intake. Increase protein intake. Wound #5 Right,Distal Toe Great: Increase protein intake. Increase protein intake. Home Health: Wound #2 Left,Lateral Malleolus: [place] [facility]. Please visit patient Monday, Wednesday, and Friday Home Health Nurse may visit PRN to address patient s wound care needs. FACE TO FACE ENCOUNTER: MEDICARE and MEDICAID PATIENTS: I certify that this patient is under my care and that I had a face-to-face encounter that meets the physician face-to-face encounter requirements with this patient on this date. The encounter with the patient was in whole or in part for the following MEDICAL CONDITION: (primary reason for [place]) MEDICAL NECESSITY: I certify, that based on my findings, NURSING services are a medically necessary home health service. HOME BOUND STATUS: I certify that my clinical findings support that this patient is homebound (i.e., Due to illness or injury, pt requires aid of supportive devices such as crutches, cane, wheelchairs, walkers, the use of special transportation or the assistance of another person to leave their place of residence. There is a normal inability to leave the home and doing so requires considerable and taxing effort. Other absences are for medical reasons / religious services and are infrequent or of short duration when for other reasons). If current dressing causes regression in wound condition, may D/C ordered dressing product/s and apply Normal Saline Moist Dressing daily until next [place] / Other MD appointment. [place] of regression in wound condition at [phone]. Please direct any NON-WOUND related issues/requests for orders to patient's Primary Care Physician Wound #3 Left Calcaneus: [name] [facility]. Please visit patient Monday,  Wednesday, and Friday Home Health Nurse may visit PRN to address patient s wound care needs. FACE TO FACE ENCOUNTER: MEDICARE and MEDICAID PATIENTS: I certify that this patient is under my care and that I had a face-to-face encounter that meets the physician face-to-face encounter requirements with this patient on this date. The encounter with the patient was in whole or in part for the following MEDICAL CONDITION: (primary reason for [place]) MEDICAL NECESSITY: I certify, that based on my findings, NURSING services are a medically necessary home health service. HOME BOUND STATUS: I certify that my clinical findings support that this patient is homebound (i.e., Due to illness or injury, pt requires aid of supportive devices such as crutches, cane, wheelchairs, walkers, the use of special transportation or the assistance of another person to leave their  place of residence. There is a normal inability to leave the home and doing so requires considerable and taxing effort. Other absences are for medical reasons / religious services and are infrequent or of short duration when for other reasons). If current dressing causes regression in wound condition, may D/C ordered dressing product/s and apply Normal Saline Moist Dressing daily until next [place] / Other MD appointment. [place] [facility] of regression in wound condition at [phone]. Please direct any NON-WOUND related issues/requests for orders to patient's Primary Care Physician Wound #5 Right,Distal Toe Great: [place] [facility]. Please visit patient Monday, Wednesday, and Friday Home Health Nurse may visit PRN to address patient s wound care needs. FACE TO FACE ENCOUNTER: MEDICARE and MEDICAID PATIENTS: I certify that this patient is under my care and that I had a face-to-face encounter that meets the physician face-to-face encounter requirements with this patient on this date.  The encounter with the patient was in whole or in part for the following MEDICAL CONDITION: (primary reason for [place]) MEDICAL NECESSITY: I certify, that based on my findings, NURSING services are a medically necessary home health service. HOME BOUND STATUS: I certify that my clinical findings support that this patient is homebound (i.e., Due to illness or injury, pt requires aid of supportive devices such as crutches, cane, wheelchairs, walkers, the use of special transportation or the assistance of another person to leave their place of residence. There is a normal inability to leave the home and doing so requires considerable and taxing effort. Other absences are for medical reasons / religious services and are infrequent or of short duration when for other reasons). If current dressing causes regression in wound condition, may D/C ordered dressing product/s and apply Normal Saline Moist Dressing daily until next [place] / Other MD appointment. [place] of regression in wound condition at [phone]. Please direct any NON-WOUND related issues/requests for orders to patient's Primary Care Physician JAMAYA, SLEETH ([id number]) I'm gonna see were things stand in one weeks time. She's in agreement that plan. If anything changes worsens meantime she will contact the office and let me know or allow her daughter to do so. Otherwise my hope is that she will continue to show signs of improvement. Please see above for specific wound care orders. We will see patient for re-evaluation in 1 week(s) here in the clinic. If anything worsens or changes patient will contact our office for additional recommendations. Electronic Signature(s) Signed: 01/31/2019 5:19:05 PM By: Worthy Keeler PA-C Entered By: Worthy Keeler on 01/31/2019 16:57:31 Utt, [name]  ([id number]) -------------------------------------------------------------------------------- ROS/PFSH Details Patient Name: [name] Date of Service: 01/31/2019 3:15 PM Medical Record Number: [id number] Patient Account Number: [id number] Date of Birth/Sex: 1923/09/07 (85 y.o. F) Treating RN: Harold Barban Primary Care Provider: Grayland Ormond Other Clinician: Referring Provider: Grayland Ormond Treating Provider/Extender: Melburn Hake, Charon Akamine Weeks in Treatment: 39 Information Obtained From Patient Wound History Do you currently have one or more open woundso Yes How many open wounds do you currently haveo 1 Approximately how long have you had your woundso 2 yrs How have you been treating your wound(s) until nowo mupirocin, soaking in epsom salt Has your wound(s) ever healed and then re-openedo No Have you had any lab work done in the past montho No Have you tested positive for an antibiotic resistant organism (MRSA, VRE)o No Have you tested positive for osteomyelitis (bone infection)o No Have you had any  tests for circulation on your legso Yes Who ordered the testo Dr. Lucky Cowboy [facility] Constitutional Symptoms (General Health) Complaints and Symptoms: Negative for: Fever; Chills Eyes Medical History: Positive for: Cataracts - surgery Respiratory Complaints and Symptoms: No Complaints or Symptoms Cardiovascular Complaints and Symptoms: No Complaints or Symptoms Medical History: Positive for: Congestive Heart Failure; Hypertension Musculoskeletal Medical History: Positive for: Osteoarthritis Neurologic Medical History: Positive for: Neuropathy PHEONIX, WISBY ([id number]) Oncologic Medical History: Negative for: Received Chemotherapy; Received Radiation Psychiatric Complaints and Symptoms: No Complaints or Symptoms HBO Extended History Items Eyes: Cataracts Immunizations Pneumococcal Vaccine: Received Pneumococcal Vaccination:  Yes Implantable Devices No devices added Family and Social History Cancer: Yes - Father,Siblings; Diabetes: No; Heart Disease: Yes - Siblings; Hereditary Spherocytosis: No; Hypertension: No; Kidney Disease: No; Lung Disease: No; Seizures: No; Stroke: No; Thyroid Problems: No; Tuberculosis: No; Never smoker; Marital Status - Married; Alcohol Use: Never; Drug Use: No History; Caffeine Use: Rarely; Financial Concerns: No; Food, Clothing or Shelter Needs: No; Support System Lacking: No; Transportation Concerns: No; Advanced Directives: No; Patient does not want information on Advanced Directives; Do not resuscitate: No; Living Will: Yes (Not Provided); Medical Power of Attorney: No Physician Affirmation I have reviewed and agree with the above information. Electronic Signature(s) Signed: 01/31/2019 5:19:05 PM By: Worthy Keeler PA-C Signed: 01/31/2019 5:19:20 PM By: Harold Barban Entered By: Worthy Keeler on 01/31/2019 16:56:43 Tamash[place], [name] ([id number]) -------------------------------------------------------------------------------- SuperBill Details Patient Name: [name] Date of Service: 01/31/2019 Medical Record Number: [id number] Patient Account Number: [id number] Date of Birth/Sex: 24-Oct-1923 (85 y.o. 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 I70.243 Atherosclerosis of native arteries of left leg with ulceration of ankle I89.0 Lymphedema, not elsewhere classified B35.4 Tinea corporis 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

## 2019-02-03 ENCOUNTER — Ambulatory Visit: Payer: Medicare Other | Admitting: Podiatry

## 2019-02-07 ENCOUNTER — Encounter: Payer: Medicare Other | Attending: Physician Assistant | Admitting: Physician Assistant

## 2019-02-07 DIAGNOSIS — M86372 Chronic multifocal osteomyelitis, left ankle and foot: Secondary | ICD-10-CM | POA: Insufficient documentation

## 2019-02-07 DIAGNOSIS — S59901A Unspecified injury of right elbow, initial encounter: Secondary | ICD-10-CM | POA: Insufficient documentation

## 2019-02-07 DIAGNOSIS — L89892 Pressure ulcer of other site, stage 2: Secondary | ICD-10-CM | POA: Insufficient documentation

## 2019-02-07 DIAGNOSIS — L97506 Non-pressure chronic ulcer of other part of unspecified foot with bone involvement without evidence of necrosis: Secondary | ICD-10-CM | POA: Insufficient documentation

## 2019-02-07 DIAGNOSIS — Z950 Presence of cardiac pacemaker: Secondary | ICD-10-CM | POA: Insufficient documentation

## 2019-02-07 DIAGNOSIS — X58XXXA Exposure to other specified factors, initial encounter: Secondary | ICD-10-CM | POA: Diagnosis not present

## 2019-02-07 DIAGNOSIS — L89623 Pressure ulcer of left heel, stage 3: Secondary | ICD-10-CM | POA: Insufficient documentation

## 2019-02-07 DIAGNOSIS — Z8249 Family history of ischemic heart disease and other diseases of the circulatory system: Secondary | ICD-10-CM | POA: Diagnosis not present

## 2019-02-07 DIAGNOSIS — Z9049 Acquired absence of other specified parts of digestive tract: Secondary | ICD-10-CM | POA: Insufficient documentation

## 2019-02-07 DIAGNOSIS — I70233 Atherosclerosis of native arteries of right leg with ulceration of ankle: Secondary | ICD-10-CM | POA: Diagnosis not present

## 2019-02-07 DIAGNOSIS — I890 Lymphedema, not elsewhere classified: Secondary | ICD-10-CM | POA: Insufficient documentation

## 2019-02-07 DIAGNOSIS — L97426 Non-pressure chronic ulcer of left heel and midfoot with bone involvement without evidence of necrosis: Secondary | ICD-10-CM | POA: Insufficient documentation

## 2019-02-07 DIAGNOSIS — I70243 Atherosclerosis of native arteries of left leg with ulceration of ankle: Secondary | ICD-10-CM | POA: Diagnosis not present

## 2019-02-07 DIAGNOSIS — L89524 Pressure ulcer of left ankle, stage 4: Secondary | ICD-10-CM | POA: Diagnosis not present

## 2019-02-07 DIAGNOSIS — I110 Hypertensive heart disease with heart failure: Secondary | ICD-10-CM | POA: Insufficient documentation

## 2019-02-07 DIAGNOSIS — I509 Heart failure, unspecified: Secondary | ICD-10-CM | POA: Diagnosis not present

## 2019-02-07 DIAGNOSIS — M1990 Unspecified osteoarthritis, unspecified site: Secondary | ICD-10-CM | POA: Diagnosis not present

## 2019-02-07 DIAGNOSIS — L89514 Pressure ulcer of right ankle, stage 4: Secondary | ICD-10-CM | POA: Diagnosis not present

## 2019-02-07 DIAGNOSIS — B354 Tinea corporis: Secondary | ICD-10-CM | POA: Diagnosis not present

## 2019-02-07 DIAGNOSIS — Z85828 Personal history of other malignant neoplasm of skin: Secondary | ICD-10-CM | POA: Insufficient documentation

## 2019-02-07 DIAGNOSIS — G629 Polyneuropathy, unspecified: Secondary | ICD-10-CM | POA: Insufficient documentation

## 2019-02-09 NOTE — Progress Notes (Signed)
[name], [name] ([id number]) Visit Report for 02/07/2019 Arrival Information Details Patient Name: [name], [name] Date of Service: 02/07/2019 10:15 AM Medical Record Number: [id number] Patient Account Number: [id number] Date of Birth/Sex: [birth year] (85 y.o. F) Treating RN: Montey Hora Primary Care Dimetrius Montfort: Grayland Ormond Other Clinician: Referring Shirlee Whitmire: Grayland Ormond Treating Huntington Leverich/Extender: Melburn Hake, HOYT Weeks in Treatment: 52 Visit Information History Since Last Visit Added or deleted any medications: No Patient Arrived: Walker Any new allergies or adverse reactions: No Arrival Time: 10:12 Had a fall or experienced change in No Accompanied By: self activities of daily living that may affect Transfer Assistance: None risk of falls: Patient Identification Verified: Yes Signs or symptoms of abuse/neglect since last visito No Secondary Verification Process Yes Hospitalized since last visit: No Completed: Implantable device outside of the clinic excluding No Patient Requires Transmission-Based No cellular tissue based products placed in the center Precautions: since last visit: Patient Has Alerts: Yes Has Dressing in Place as Prescribed: Yes Patient Alerts: ABI AVVS 03/29/18 Pain Present Now: No L 1.05 R 1.03, TBI L .56, R .85 ABI AVVS 07/23/18 L .95 R 1.26 Electronic Signature(s) Signed: 02/07/2019 3:08:28 PM By: Lorine Bears RCP, RRT, CHT Entered By: Lorine Bears on 02/07/2019 10:12:50 Frankowski, [name] ([id number]) -------------------------------------------------------------------------------- Encounter Discharge Information Details Patient Name: [name] Date of Service: 02/07/2019 10:15 AM Medical Record Number: [id number] Patient Account Number: [id number] Date of Birth/Sex: 1923/10/06 (85 y.o. F) Treating RN: Cornell Barman Primary Care Tashara Suder: Grayland Ormond Other Clinician: Referring Karem Farha: Grayland Ormond Treating Kamerin Grumbine/Extender: Melburn Hake, HOYT Weeks in Treatment: 10 Encounter Discharge Information Items Discharge Condition: Stable Ambulatory Status: Ambulatory Discharge Destination: Home Transportation: Private Auto Accompanied By: daughter Schedule Follow-up Appointment: Yes Clinical Summary of Care: Electronic Signature(s) Signed: 02/07/2019 4:58:22 PM By: Gretta Cool, BSN, RN, CWS, Kim RN, BSN Entered By: Gretta Cool, BSN, RN, CWS, Kim on 02/07/2019 11:05:10 [name] ([id number]) -------------------------------------------------------------------------------- Lower Extremity Assessment Details Patient Name: [name] Date of Service: 02/07/2019 10:15 AM Medical Record Number: [id number] Patient Account Number: [id number] Date of Birth/Sex: Mar 30, 1923 (85 y.o. F) Treating RN: Army Melia Primary Care Darrious [name]: Grayland Ormond Other Clinician: Referring Aylin Rhoads: Grayland Ormond Treating Aniel Hubble/Extender: Melburn Hake, HOYT Weeks in Treatment: 78 Edema Assessment Assessed: [Left: No] [Right: No] Edema: [Left: No] [Right: No] Calf Left: Right: Point of Measurement: 30 cm From Medial Instep 29 cm 29 cm Ankle Left: Right: Point of Measurement: 12 cm From Medial Instep 19 cm 22 cm Vascular Assessment Pulses: Dorsalis Pedis Palpable: [Left:Yes] [Right:Yes] Posterior Tibial Extremity colors, hair growth, and conditions: Extremity Color: [Left:Hyperpigmented] [Right:Hyperpigmented] Hair Growth on Extremity: [Left:No] [Right:No] Temperature of Extremity: [Left:Warm] [Right:Warm] Capillary Refill: [Left:< 3 seconds] [Right:< 3 seconds] Toe Nail Assessment Left: Right: Thick: Yes Yes Discolored: No No Deformed: No No Improper Length and Hygiene: No Electronic Signature(s) Signed: 02/07/2019 3:17:07 PM By: Army Melia Entered By: Army Melia on 02/07/2019 10:28:35 Ilic, [name]  ([id number]) -------------------------------------------------------------------------------- Multi Wound Chart Details Patient Name: [name] Date of Service: 02/07/2019 10:15 AM Medical Record Number: [id number] Patient Account Number: [id number] Date of Birth/Sex: Jun 09, 1923 (85 y.o. F) Treating RN: Montey Hora Primary Care Safire Gordin: Grayland Ormond Other Clinician: Referring Shuntavia Yerby: Grayland Ormond Treating Shirah Roseman/Extender: STONE III, HOYT Weeks in Treatment: 78 Vital Signs Height(in): 65 Pulse(bpm): 85 Weight(lbs): 154.3 Blood Pressure(mmHg): 137/56 Body Mass Index(BMI): 26 Temperature(F): 97.6 Respiratory Rate 16 (breaths/min): Photos: [2:No Photos] [3:No Photos] [5:No Photos] Wound Location: [2:Left Malleolus - Lateral] [3:Left Calcaneus] [5:Right Toe Great -  Distal] Wounding Event: [2:Gradually Appeared] [3:Gradually Appeared] [5:Footwear Injury] 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] [3:Pressure Ulcer] [5:Pressure Ulcer] 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] [3:N/A] [5:N/A] Comorbid History: [2:Cataracts, Congestive Heart Failure, Hypertension, Osteoarthritis, Neuropathy] [3:Cataracts, Congestive Heart Failure, Hypertension, Osteoarthritis, Neuropathy] [5:Cataracts, Congestive Heart Failure, Hypertension, Osteoarthritis,  Neuropathy] Date Acquired: [2:04/12/2018] [3:10/04/2018] [5:12/03/2018] Weeks of Treatment: [2:42] [3:17] [5:9] Wound Status: [2:Open] [3:Open] [5:Open] Measurements L x W x D [2:0.7x0.7x0.2] [3:0.9x0.7x0.1] [5:0.8x1.1x0.2] (cm) Area (cm) : [2:0.385] [3:0.495] [5:0.691] Volume (cm) : [2:0.077] [3:0.049] [5:0.138] % Reduction in Area: [2:51.00%] [3:82.00%] [5:-95.80%] % Reduction in Volume: [2:2.50%] [3:91.10%] [5:-294.30%] Classification: [2:Category/Stage IV] [3:Category/Stage II] [5:Category/Stage II] Exudate Amount: [2:Medium] [3:Small] [5:Large] Exudate Type: [2:Serous] [3:Serous] [5:Serous] Exudate Color: [2:amber]  [3:amber] [5:amber] Wound Margin: [2:Flat and Intact] [3:Indistinct, nonvisible] [5:Flat and Intact] Granulation Amount: [2:Large (67-100%)] [3:Small (1-33%)] [5:Small (1-33%)] Granulation Quality: [2:Red] [3:Pink] [5:Red] Necrotic Amount: [2:Small (1-33%)] [3:Medium (34-66%)] [5:Medium (34-66%)] Necrotic Tissue: [2:Adherent Slough] [3:Eschar, Adherent Slough] [5:Eschar, Adherent Slough] Exposed Structures: [2:Fat Layer (Subcutaneous Tissue) Exposed: Yes Fascia: No Tendon: No Muscle: No Joint: No Bone: No] [3:Fat Layer (Subcutaneous Tissue) Exposed: Yes Fascia: No Tendon: No Muscle: No Joint: No Bone: No] [5:Fat Layer (Subcutaneous Tissue) Exposed: Yes  Fascia: No Tendon: No Muscle: No Joint: No Bone: No] Epithelialization: [2:Small (1-33%)] [3:Small (1-33%)] [5:None] Periwound Skin Texture: [2:Excoriation: No Induration: No Callus: No] [3:Excoriation: No Induration: No Callus: No] [5:Excoriation: No Induration: No Callus: No] Crepitus: No Crepitus: No Crepitus: No Rash: No Rash: No Rash: No Scarring: No Scarring: No Scarring: No Periwound Skin Moisture: Maceration: No Maceration: Yes Dry/Scaly: Yes Dry/Scaly: No Dry/Scaly: Yes Maceration: No Periwound Skin Color: Atrophie Blanche: No Atrophie Blanche: No Ecchymosis: Yes Cyanosis: No Cyanosis: No Atrophie Blanche: No Ecchymosis: No Ecchymosis: No Cyanosis: No Erythema: No Erythema: No Erythema: No Hemosiderin Staining: No Hemosiderin Staining: No Hemosiderin Staining: No Mottled: No Mottled: No Mottled: No Pallor: No Pallor: No Pallor: No Rubor: No Rubor: No Rubor: No Temperature: No Abnormality No Abnormality No Abnormality Tenderness on Palpation: No No No Wound Preparation: Ulcer Cleansing: Ulcer Cleansing: Ulcer Cleansing: Rinsed/Irrigated with Saline Rinsed/Irrigated with Saline Rinsed/Irrigated with Saline Topical Anesthetic Applied: Topical Anesthetic Applied: Topical Anesthetic Applied: Other:  lidocaine 4% Other: lidocaine 4% Other: lidocaine 4% Treatment Notes Electronic Signature(s) Signed: 02/07/2019 4:27:08 PM By: Montey Hora Entered By: Montey Hora on 02/07/2019 10:35:23 Coale, [name] ([id number]) -------------------------------------------------------------------------------- [place] Details Patient Name: [name] Date of Service: 02/07/2019 10:15 AM Medical Record Number: [id number] Patient Account Number: [id number] Date of Birth/Sex: Apr 12, 1923 (85 y.o. F) Treating RN: Montey Hora Primary Care Tawan Degroote: Grayland Ormond Other Clinician: Referring Demarious Kapur: Grayland Ormond Treating Kalysta Kneisley/Extender: Melburn Hake, HOYT Weeks in Treatment: 98 Active Inactive Abuse / Safety / Falls / Self Care Management Nursing Diagnoses: Potential for falls Goals: Patient will not experience any injury related to falls Date Initiated: 08/10/2017 Target Resolution Date: 11/10/2017 Goal Status: Active Interventions: Assess Activities of Daily Living upon admission and as needed Assess fall risk on admission and as needed Assess: immobility, friction, shearing, incontinence upon admission and as needed Notes: Nutrition Nursing Diagnoses: Imbalanced nutrition Potential for alteratiion in Nutrition/Potential for imbalanced nutrition Goals: Patient/caregiver agrees to and verbalizes understanding of need to use nutritional supplements and/or vitamins as prescribed Date Initiated: 08/10/2017 Target Resolution Date: 12/08/2017 Goal Status: Active Interventions: Assess patient nutrition upon admission and as needed per policy Notes: Orientation to the Wound Care Program Nursing Diagnoses: Knowledge deficit related to the wound healing center program Goals: Patient/caregiver will verbalize understanding of the  Wound Healing Center Program Date Initiated: 08/10/2017 Target Resolution Date: 09/08/2017 Goal Status: Active JOCILYNN, GRADE  ([id number]) Interventions: Provide education on orientation to the wound center Notes: Pain, Acute or Chronic Nursing Diagnoses: Pain, acute or chronic: actual or potential Potential alteration in comfort, pain Goals: Patient/caregiver will verbalize adequate pain control between visits Date Initiated: 08/10/2017 Target Resolution Date: 12/08/2017 Goal Status: Active Interventions: Complete pain assessment as per visit requirements Notes: Wound/Skin Impairment Nursing Diagnoses: Impaired tissue integrity Knowledge deficit related to ulceration/compromised skin integrity Goals: Ulcer/skin breakdown will have a volume reduction of 80% by week 12 Date Initiated: 08/10/2017 Target Resolution Date: 12/01/2017 Goal Status: Active Interventions: Assess patient/caregiver ability to perform ulcer/skin care regimen upon admission and as needed Notes: Electronic Signature(s) Signed: 02/07/2019 4:27:08 PM By: Montey Hora Entered By: Montey Hora on 02/07/2019 10:35:12 Keller, [name] ([id number]) -------------------------------------------------------------------------------- Pain Assessment Details Patient Name: [name] Date of Service: 02/07/2019 10:15 AM Medical Record Number: [id number] Patient Account Number: [id number] Date of Birth/Sex: [birth year] (85 y.o. F) Treating RN: Montey Hora Primary Care Analys Ryden: Grayland Ormond Other Clinician: Referring Caroleen Stoermer: Grayland Ormond Treating Marshia Tropea/Extender: Melburn Hake, HOYT Weeks in Treatment: 80 Active Problems Location of Pain Severity and Description of Pain Patient Has Paino No Site Locations Pain Management and Medication Current Pain Management: Electronic Signature(s) Signed: 02/07/2019 3:08:28 PM By: Lorine Bears RCP, RRT, CHT Signed: 02/07/2019 4:27:08 PM By: Montey Hora Entered By: Lorine Bears on 02/07/2019 10:13:00 [name]  ([id number]) -------------------------------------------------------------------------------- Patient/Caregiver Education Details Patient Name: [name] Date of Service: 02/07/2019 10:15 AM Medical Record Number: [id number] Patient Account Number: [id number] Date of Birth/Gender: 09/16/1923 (85 y.o. F) Treating RN: Montey Hora Primary Care Physician: Grayland Ormond Other Clinician: Referring Physician: Grayland Ormond Treating Physician/Extender: Sharalyn Ink in Treatment: 66 Education Assessment Education Provided To: Patient and Caregiver Education Topics Provided Wound/Skin Impairment: Handouts: Other: wound care as ordered Methods: Demonstration, Explain/Verbal Responses: State content correctly Electronic Signature(s) Signed: 02/07/2019 4:27:08 PM By: Montey Hora Entered By: Montey Hora on 02/07/2019 10:48:37 Omlor, [name] ([id number]) -------------------------------------------------------------------------------- Wound Assessment Details Patient Name: [name] Date of Service: 02/07/2019 10:15 AM Medical Record Number: [id number] Patient Account Number: [id number] Date of Birth/Sex: [birth year] (85 y.o. F) Treating RN: Army Melia Primary Care Derin Granquist: Grayland Ormond Other Clinician: Referring Raydin Bielinski: Grayland Ormond Treating Maili Shutters/Extender: Melburn Hake, HOYT Weeks in Treatment: 22 Wound Status Wound Number: 2 Primary Pressure Ulcer Etiology: Wound Location: Left Malleolus - Lateral Secondary Arterial Insufficiency Ulcer Wounding Event: Gradually Appeared Etiology: Date Acquired: 04/12/2018 Wound Status: Open Weeks Of Treatment: 42 Comorbid Cataracts, Congestive Heart Failure, Clustered Wound: No History: Hypertension, Osteoarthritis, Neuropathy Photos Photo Uploaded By: Army Melia on 02/07/2019 11:10:28 Wound Measurements Length: (cm) 0.7 Width: (cm) 0.7 Depth: (cm) 0.2 Area: (cm) 0.385 Volume: (cm) 0.077 %  Reduction in Area: 51% % Reduction in Volume: 2.5% Epithelialization: Small (1-33%) Tunneling: No Undermining: No Wound Description Classification: Category/Stage IV Foul Odo Wound Margin: Flat and Intact Slough/F Exudate Amount: Medium Exudate Type: Serous Exudate Color: amber r After Cleansing: No ibrino No Wound Bed Granulation Amount: Large (67-100%) Exposed Structure Granulation Quality: Red Fascia Exposed: No Necrotic Amount: Small (1-33%) Fat Layer (Subcutaneous Tissue) Exposed: Yes Necrotic Quality: Adherent Slough Tendon Exposed: No Muscle Exposed: No Joint Exposed: No Bone Exposed: No Periwound Skin Texture Sanjuan, [name] ([id number]) Texture Color No Abnormalities Noted: No No Abnormalities Noted: No Callus: No Atrophie Blanche: No Crepitus: No Cyanosis: No Excoriation: No Ecchymosis: No Induration:  No Erythema: No Rash: No Hemosiderin Staining: No Scarring: No Mottled: No Pallor: No Moisture Rubor: No No Abnormalities Noted: No Dry / Scaly: No Temperature / Pain Maceration: No Temperature: No Abnormality Wound Preparation Ulcer Cleansing: Rinsed/Irrigated with Saline Topical Anesthetic Applied: Other: lidocaine 4%, Treatment Notes Wound #2 (Left, Lateral Malleolus) Notes PRisma on left, silvercell on toe Electronic Signature(s) Signed: 02/07/2019 3:17:07 PM By: Army Melia Entered By: Army Melia on 02/07/2019 10:25:53 Kinlaw, [name] ([id number]) -------------------------------------------------------------------------------- Wound Assessment Details Patient Name: [name] Date of Service: 02/07/2019 10:15 AM Medical Record Number: [id number] Patient Account Number: [id number] Date of Birth/Sex: Dec 03, 1923 (85 y.o. F) Treating RN: Army Melia Primary Care Nollan Muldrow: Grayland Ormond Other Clinician: Referring Ardra Kuznicki: Grayland Ormond Treating Kanetra Ho/Extender: Melburn Hake, HOYT Weeks in Treatment: 78 Wound Status Wound  Number: 3 Primary Pressure Ulcer Etiology: Wound Location: Left Calcaneus Wound Open Wounding Event: Gradually Appeared Status: Date Acquired: 10/04/2018 Comorbid Cataracts, Congestive Heart Failure, Weeks Of Treatment: 17 History: Hypertension, Osteoarthritis, Neuropathy Clustered Wound: No Photos Photo Uploaded By: Army Melia on 02/07/2019 11:11:20 Wound Measurements Length: (cm) 0.9 Width: (cm) 0.7 Depth: (cm) 0.1 Area: (cm) 0.495 Volume: (cm) 0.049 % Reduction in Area: 82% % Reduction in Volume: 91.1% Epithelialization: Small (1-33%) Tunneling: No Undermining: No Wound Description Classification: Category/Stage II Foul Odo Wound Margin: Indistinct, nonvisible Slough/F Exudate Amount: Small Exudate Type: Serous Exudate Color: amber r After Cleansing: No ibrino Yes Wound Bed Granulation Amount: Small (1-33%) Exposed Structure Granulation Quality: Pink Fascia Exposed: No Necrotic Amount: Medium (34-66%) Fat Layer (Subcutaneous Tissue) Exposed: Yes Necrotic Quality: Eschar, Adherent Slough Tendon Exposed: No Muscle Exposed: No Joint Exposed: No Bone Exposed: No Periwound Skin Texture Hevia, [name] ([id number]) Texture Color No Abnormalities Noted: No No Abnormalities Noted: No Callus: No Atrophie Blanche: No Crepitus: No Cyanosis: No Excoriation: No Ecchymosis: No Induration: No Erythema: No Rash: No Hemosiderin Staining: No Scarring: No Mottled: No Pallor: No Moisture Rubor: No No Abnormalities Noted: No Dry / Scaly: Yes Temperature / Pain Maceration: Yes Temperature: No Abnormality Wound Preparation Ulcer Cleansing: Rinsed/Irrigated with Saline Topical Anesthetic Applied: Other: lidocaine 4%, Treatment Notes Wound #3 (Left Calcaneus) Notes PRisma on left, silvercell on toe Electronic Signature(s) Signed: 02/07/2019 3:17:07 PM By: Army Melia Entered By: Army Melia on 02/07/2019 10:26:16 Tobler, [name]  ([id number]) -------------------------------------------------------------------------------- Wound Assessment Details Patient Name: [name] Date of Service: 02/07/2019 10:15 AM Medical Record Number: [id number] Patient Account Number: [id number] Date of Birth/Sex: 05-21-1923 (85 y.o. F) Treating RN: Army Melia Primary Care Sriram Febles: Grayland Ormond Other Clinician: Referring Shawnese Magner: Grayland Ormond Treating Shoua Ressler/Extender: STONE III, HOYT Weeks in Treatment: 78 Wound Status Wound Number: 5 Primary Pressure Ulcer Etiology: Wound Location: Right Toe Great - Distal Wound Open Wounding Event: Footwear Injury Status: Date Acquired: 12/03/2018 Comorbid Cataracts, Congestive Heart Failure, Weeks Of Treatment: 9 History: Hypertension, Osteoarthritis, Neuropathy Clustered Wound: No Photos Photo Uploaded By: Army Melia on 02/07/2019 11:11:20 Wound Measurements Length: (cm) 0.8 Width: (cm) 1.1 Depth: (cm) 0.2 Area: (cm) 0.691 Volume: (cm) 0.138 % Reduction in Area: -95.8% % Reduction in Volume: -294.3% Epithelialization: None Tunneling: No Undermining: No Wound Description Classification: Category/Stage II Foul Odo Wound Margin: Flat and Intact Slough/F Exudate Amount: Large Exudate Type: Serous Exudate Color: amber r After Cleansing: No ibrino Yes Wound Bed Granulation Amount: Small (1-33%) Exposed Structure Granulation Quality: Red Fascia Exposed: No Necrotic Amount: Medium (34-66%) Fat Layer (Subcutaneous Tissue) Exposed: Yes Necrotic Quality: Eschar, Adherent Slough Tendon Exposed: No Muscle Exposed: No Joint Exposed: No  Bone Exposed: No Periwound Skin Texture JALEEYAH, MUNCE ([id number]) Texture Color No Abnormalities Noted: No No Abnormalities Noted: No Callus: No Atrophie Blanche: No Crepitus: No Cyanosis: No Excoriation: No Ecchymosis: Yes Induration: No Erythema: No Rash: No Hemosiderin Staining: No Scarring: No Mottled:  No Pallor: No Moisture Rubor: No No Abnormalities Noted: No Dry / Scaly: Yes Temperature / Pain Maceration: No Temperature: No Abnormality Wound Preparation Ulcer Cleansing: Rinsed/Irrigated with Saline Topical Anesthetic Applied: Other: lidocaine 4%, Treatment Notes Wound #5 (Right, Distal Toe Great) Notes PRisma on left, silvercell on toe Electronic Signature(s) Signed: 02/07/2019 3:17:07 PM By: Army Melia Entered By: Army Melia on 02/07/2019 10:26:56 Mountz, [name] ([id number]) -------------------------------------------------------------------------------- Vitals Details Patient Name: [name] Date of Service: 02/07/2019 10:15 AM Medical Record Number: [id number] Patient Account Number: [id number] Date of Birth/Sex: 12/04/1923 (85 y.o. F) Treating RN: Montey Hora Primary Care Simon Llamas: Grayland Ormond Other Clinician: Referring Eshawn Coor: Grayland Ormond Treating Yoandri Congrove/Extender: Melburn Hake, HOYT Weeks in Treatment: 18 Vital Signs Time Taken: 10:13 Temperature (F): 97.6 Height (in): 65 Pulse (bpm): 85 Weight (lbs): 154.3 Respiratory Rate (breaths/min): 16 Body Mass Index (BMI): 25.7 Blood Pressure (mmHg): 137/56 Reference Range: 80 - 120 mg / dl Electronic Signature(s) Signed: 02/07/2019 3:08:28 PM By: Lorine Bears RCP, RRT, CHT Entered By: Lorine Bears on 02/07/2019 10:15:11

## 2019-02-10 NOTE — Progress Notes (Signed)
RHEN, DOSSANTOS ([id number]) Visit Report for 12/23/2018 Chief Complaint Document Details Patient Name: [name], [name]. Date of Service: 12/23/2018 3:15 PM Medical Record Number: [id number] Patient Account Number: [id number] Date of Birth/Sex: July 03, 1923 (85 y.o. F) Treating RN: Harold Barban Primary Care Provider: Grayland Ormond Other Clinician: Referring Provider: Grayland Ormond Treating Provider/Extender: Melburn Hake, HOYT Weeks in Treatment: 53 Information Obtained from: Patient Chief Complaint Patient is here for right lateral malleolus, right 2nd distal toe and left lateral malleolus ulcer Electronic Signature(s) Signed: 12/26/2018 9:23:56 AM By: Worthy Keeler PA-C Entered By: Worthy Keeler on 12/23/2018 15:40:32 [name], [name] ([id number]) -------------------------------------------------------------------------------- Debridement Details Patient Name: [name] Date of Service: 12/23/2018 3:15 PM Medical Record Number: [id number] Patient Account Number: [id number] Date of Birth/Sex: 09-03-1923 (84 y.o. F) Treating RN: Harold Barban Primary Care Provider: Grayland Ormond Other Clinician: Referring Provider: Grayland Ormond Treating Provider/Extender: Melburn Hake, HOYT Weeks in Treatment: 3 Debridement Performed for Wound #5 Right,Distal Toe Great Assessment: Performed By: Physician STONE III, HOYT E., PA-C Debridement Type: Debridement Level of Consciousness (Pre- Awake and Alert procedure): Pre-procedure Verification/Time Yes - 16:02 Out Taken: Start Time: 16:02 Pain Control: Lidocaine Total Area Debrided (L x W): 0.4 (cm) x 0.4 (cm) = 0.16 (cm) Tissue and other material Viable, Non-Viable, Callus, Slough, Subcutaneous, Slough debrided: Level: Skin/Subcutaneous Tissue Debridement Description: Excisional Instrument: Curette Bleeding: Minimum Hemostasis Achieved: Pressure End Time: 16:06 Procedural Pain: 0 Post Procedural Pain:  0 Response to Treatment: Procedure was tolerated well Level of Consciousness Awake and Alert (Post-procedure): Post Debridement Measurements of Total Wound Length: (cm) 0.4 Stage: Category/Stage II Width: (cm) 0.4 Depth: (cm) 0.1 Volume: (cm) 0.013 Character of Wound/Ulcer Post Improved Debridement: Post Procedure Diagnosis Same as Pre-procedure Electronic Signature(s) Signed: 12/23/2018 5:13:49 PM By: Harold Barban Signed: 12/26/2018 9:23:56 AM By: Worthy Keeler PA-C Entered By: Harold Barban on 12/23/2018 16:04:18 [name], [name] ([id number]) -------------------------------------------------------------------------------- Debridement Details Patient Name: [name] Date of Service: 12/23/2018 3:15 PM Medical Record Number: [id number] Patient Account Number: [id number] Date of Birth/Sex: 1922/12/19 (85 y.o. F) Treating RN: Harold Barban Primary Care Provider: Grayland Ormond Other Clinician: Referring Provider: Grayland Ormond Treating Provider/Extender: Melburn Hake, HOYT Weeks in Treatment: 53 Debridement Performed for Wound #4 Right,Distal Toe Second Assessment: Performed By: Physician STONE III, HOYT E., PA-C Debridement Type: Debridement Level of Consciousness (Pre- Awake and Alert procedure): Pre-procedure Verification/Time Yes - 16:02 Out Taken: Start Time: 16:02 Pain Control: Lidocaine Total Area Debrided (L x W): 0.2 (cm) x 0.3 (cm) = 0.06 (cm) Tissue and other material Viable, Non-Viable, Callus, Slough, Subcutaneous, Slough debrided: Level: Skin/Subcutaneous Tissue Debridement Description: Excisional Instrument: Curette Bleeding: Minimum Hemostasis Achieved: Pressure End Time: 16:06 Procedural Pain: 0 Post Procedural Pain: 0 Response to Treatment: Procedure was tolerated well Level of Consciousness Awake and Alert (Post-procedure): Post Debridement Measurements of Total Wound Length: (cm) 0.2 Stage: Category/Stage II Width:  (cm) 0.3 Depth: (cm) 0.4 Volume: (cm) 0.019 Character of Wound/Ulcer Post Improved Debridement: Post Procedure Diagnosis Same as Pre-procedure Electronic Signature(s) Signed: 12/23/2018 5:13:49 PM By: Harold Barban Signed: 12/26/2018 9:23:56 AM By: Worthy Keeler PA-C Entered By: Harold Barban on 12/23/2018 16:04:54 [name], [name] ([id number]) -------------------------------------------------------------------------------- HPI Details Patient Name: [name] Date of Service: 12/23/2018 3:15 PM Medical Record Number: [id number] Patient Account Number: [id number] Date of Birth/Sex: [birth year] (85 y.o. 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 Dr. Amalia Hailey the podiatrist saw her on 07/20/2017 and sent her to the wound center for possible hyperbaric oxygen therapy. past medical history of peripheral vascular disease, varicose veins, status post appendectomy, basal cell carcinoma excision from the left leg, cholecystectomy, pacemaker placement, right lower extremity angiography done by Dr. dew in March 2017 with placement of a stent. there is also note of a successful ablation of the right small saphenous vein done which was reviewed by ultrasound on 10/24/2016. the patient had a right small saphenous vein ablation done on 10/20/2016. The patient has never been a smoker. She has been seen by Dr. Corene Cornea dew the vascular surgeon who most recently saw her on 06/15/2017 for evaluation of ongoing problems with right leg swelling. She had a lower extremity arterial duplex examination done(02/13/17)  which showed patent distal right superficial femoral artery stent and above-the-knee popliteal stent without evidence of restenosis. The ABI was more than 1.3 on the right and more than 1.3 on the left. This was consistent with noncompressible arteries due to medial calcification. The right great toe pressure and PPG waveforms are within normal limits and the left great toe pressure and PPG waveforms are decreased. he recommended she continue to wear her compression stockings and continue with elevation. She is scheduled to have a noninvasive arterial study in the near future 08/16/2017 -- had a lower extremity arterial duplex examination done which showed patent distal right superficial femoral artery stent and above-the-knee popliteal stent without evidence of restenosis. The ABI was more than 1.3 on the right and more than 1.3 on the left. This was consistent with noncompressible arteries due to medial calcification. The right great toe pressure and PPG waveforms are within normal limits and the left great toe pressure and PPG waveforms are decreased. the x-ray of the right ankle has not yet been done 08/24/2017 -- had a right ankle x-ray -- IMPRESSION:1. No fracture, bone lesion or evidence of osteomyelitis. 2. Lateral soft tissue swelling with a soft tissue ulcer. she has not yet seen the vascular surgeon for review 08/31/17 on evaluation today patient's wound appears to be showing signs of improvement. She still with her appointment with vascular in order to review her results of her vascular study and then determine if any intervention would be recommended at that time. No fevers, chills, nausea, or vomiting noted at this time. She has been tolerating the dressing changes without complication. 09/28/17 on evaluation today patient's wound appears to show signs of good improvement in regard to the granulation tissue which is surfacing. There is still a layer of slough covering the wound and  the posterior portion is still significantly deeper than the anterior nonetheless there has been some good sign of things moving towards the better. She is going to go back to Dr. dew for reevaluation to ensure her blood flow is still appropriate. That will be before her next evaluation with [place] next week. No fevers, chills, nausea, or vomiting noted at this time. Patient does have some discomfort rated to be a 3-4/10 depending on activity specifically cleansing the wound makes it worse. 10/05/2017 -- the patient was seen by Dr. Lucky Cowboy last week and noninvasive studies showed a normal right ABI with brisk triphasic waveforms consistent with no arterial insufficiency including normal digital pressures. The duplex showed  a patent distal right SFA stent and the proximal SFA was also normal. He was pleased with her test and thought she should have enough of perfusion for normal wound healing. He would see her back in 6 months time. 12/21/17 on evaluation today patient appears to be doing fairly well in regard to her right lateral ankle wound. Unfortunately the main issue that she is expansion at this point is that she is having some issues with what appears to be some cellulitis in the KYLIANA, STANDEN. ([id number]) right anterior shin. She has also been noting a little bit of uncomfortable feeling especially last night and her ankle area. I'm afraid that she made the developing a little bit of an infection. With that being said I think it is in the early stages. 12/28/17 on evaluation today patient's ankle appears to be doing excellent. She's making good progress at this point the cellulitis seems to have improved after last week's evaluation. Overall she is having no significant discomfort which is excellent news. She does have an appointment with Dr. dew on March 29, 2018 for reevaluation in regard to the stent he placed. She seems to have excellent blood flow in the right lower extremity. 01/19/12 on  evaluation today patient's wound appears to be doing very well. In fact she does not appear to require debridement at this point, there's no evidence of infection, and overall from the standpoint of the wound she seems to be doing very well. With that being said I believe that it may be time to switch to different dressing away from the [facility] Dressing she tells me she does have a lot going on her friend actually passed away yesterday and she's also having a lot of issues with her husband this obviously is weighing heavy on her as far as your thoughts and concerns today. 01/25/18 on evaluation today patient appears to be doing fairly well in regard to her right lateral malleolus. She has been tolerating the dressing changes without complication. Overall I feel like this is definitely showing signs of improvement as far as how the overall appearance of the wound is there's also evidence of epithelium start to migrate over the granulation tissue. In general I think that she is progressing nicely as far as the wound is concerned. The only concern she really has is whether or not we can switch to every other week visits in order to avoid having as many appointments as her daughters have a difficult time getting her to her appointments as well as the patient's husband to his he is not doing very well at this point. 02/22/18 on evaluation today patient's right lateral malleolus ulcer appears to be doing great. She has been tolerating the dressing changes without complication. Overall you making excellent progress at this time. Patient is having no significant discomfort. 03/15/18 on evaluation today patient appears to be doing much more poorly in regard to her right lateral ankle ulcer at this point. Unfortunately since have last seen her her husband has passed just a few days ago is obviously weighed heavily on her her daughter also had surgery well she is with her today as usual. There does not  appear to be any evidence of infection she does seem to have significant contusion/deep tissue injury to the right lateral malleolus which was not noted previous when I saw her last. It's hard to tell of exactly when this injury occurred although during the time she was spending the night in the hospital  this may have been most likely. 03/22/18 on evaluation today patient appears to actually be doing very well in regard to her ulcer. She did unfortunately have a setback which was noted last week however the good news is we seem to be getting back on track and in fact the wound in the core did still have some necrotic tissue which will be addressed at this point today but in general I'm seeing signs that things are on the up and up. She is glad to hear this obviously she's been somewhat concerned that due to the how her wound digressed more recently. 03/29/18 on evaluation today patient appears to be doing fairly well in regard to her right lower extremity lateral malleolus ulcer. She unfortunately does have a new area of pressure injury over the inferior portion where the wound has opened up a little bit larger secondary to the pressure she seems to be getting. She does tell me sometimes when she sleeps at night that it actually hurts and does seem to be pushing on the area little bit more unfortunately. There does not appear to be any evidence of infection which is good news. She has been tolerating the dressing changes without complication. She also did have some bruising in the left second and third toes due to the fact that she may have bump this or injured it although she has neuropathy so she does not feel she did move recently that may have been where this came from. Nonetheless there does not appear to be any evidence of infection at this time. 04/12/18 on evaluation today patient's wound on the right lateral ankle actually appears to be doing a little bit better with a lot of necrotic docking  tissue centrally loosening up in clearing away. However she does have the beginnings of a deep tissue injury on the left lateral malleolus likely due to the fact we've been trying offload the right as much as we have. I think she may benefit from an assistive soft device to help with offloading and it looks like they're looking at one of the doughnut conditions that wraps around the lower leg to offload which I think will definitely do a good job. With that being said I think we definitely need to address this issue on the left before it becomes a wound. Patient is not having significant pain. 04/19/18 on evaluation today patient appears to be doing excellent in regard to the progress she's made with her right lateral ankle ulcer. The left ankle region which did show evidence of a deep tissue injury seems to be resolving there's little fluid noted underneath and a blister there's nothing open at this point in time overall I feel like this is progressing nicely which is good news. She does not seem to be having significant discomfort at this point which is also good news. 04/25/18-She is here in follow up evaluation for bilateral lateral malleolar ulcers. The right lateral malleolus ulcer with pale subcutaneous tissue exposure, central area of ulcer with tendon/periosteum exposed. The left lateral malleolus ulcer now with [name], [name] ([id number]) central area of nonviable tissue, otherwise deep tissue injury. She is wearing compression wraps to the left lower extremity, she will place the right lower extremity compression wraps on when she gets home. She will be out of town over the weekend and return next week and follow-up appointment. She completed her doxycycline this morning 05/03/18 on evaluation today patient appears to be doing very well in regard to her  right lateral ankle ulcer in general. At least she's showing some signs of improvement in this regard. Unfortunately she has some  additional injury to the left lateral malleolus region which appears to be new likely even over the past several days. Again this determination is based on the overall appearance. With that being said the patient is obviously frustrated about this currently. 05/10/18-She is here in follow-up evaluation for bilateral lateral malleolar ulcers. She states she has purchased offloading shoes/boots and they will arrive tomorrow. She was asked to bring them in the office at next week's appointment so her provider is aware of product being utilized. She continues to sleep on right or left side, she has been encouraged to sleep on her back. The right lateral malleolus ulcer is precariously close to peri-osteum; will order xray. The left lateral malleolus ulcer is improved. Will switch back to santyl; she will follow up next week. 05/17/18 on evaluation today patient actually appears to be doing very well in regard to her malleolus her ulcers compared to last time I saw them. She does not seem to have as much in the way of contusion at this point which is great news. With that being said she does continue to have discomfort and I do believe that she is still continuing to benefit from the offloading/pressure reducing boots that were recommended. I think this is the key to trying to get this to heal up completely. 05/24/18 on evaluation today patient actually appears to be doing worse at this point in time unfortunately compared to her last week's evaluation. She is having really no increased pain which is good news unfortunately she does have more maceration in your theme and noted surrounding the right lateral ankle the left lateral ankle is not really is erythematous I do not see signs of the overt cellulitis on that side. Unfortunately the wounds do not seem to have shown any signs of improvement since the last evaluation. She also has significant swelling especially on the right compared to previous some of  this may be due to infection however also think that she may be served better while she has these wounds by compression wrapping versus continuing to use the Juxta-Lite for the time being. Especially with the amount of drainage that she is experiencing at this point. No fevers, chills, nausea, or vomiting noted at this time. 05/31/18 on evaluation today patient appears to actually be doing better in regard to her right lateral lower extremity ulcer specifically on the malleolus region. She has been tolerating the antibiotic without complication. With that being said she still continues to have issues but a little bit of redness although nothing like she what she was experiencing previous. She still continues to pressure to her ankle area she did get the problem on offloading boots unfortunately she will not wear them she states there too uncomfortable and she can't get in and out of the bed. Nonetheless at this point her wounds seem to be continually getting worse which is not what we want I'm getting somewhat concerned about her progress and how things are going to proceed if we do not intervene in some way shape or form. I therefore had a very lengthy conversation today about offloading yet again and even made a specific suggestion for switching her to a memory foam mattress and even gave the information for a specific one that they could look at getting if it was something that they were interested in considering. She does not want  to be considered for a hospital bed air mattress although honestly insurance would not cover it that she does not have any wounds on her trunk. 06/14/18 on evaluation today both wounds over the bilateral lateral malleolus her ulcers appear to be doing better there's no evidence of pressure injury at this point. She did get the foam mattress for her bed and this does seem to have been extremely beneficial for her in my pinion. Her daughter states that she is having  difficulty getting out of bed because of how soft it is. The patient also relates this to be. Nonetheless I do feel like she's actually doing better. Unfortunately right after and around the time she was getting the mattress she also sustained a fall when she got up to go pick up the phone and ended up injuring her right elbow she has 18 sut[address] place. We are not caring for this currently although home health is going to be taking the sutures out shortly. Nonetheless this may be something that we need to evaluate going forward. It depends on how well it has or has not healed in the end. She also recently saw an orthopedic specialist for an injection in the right shoulder just before her fall unfortunately the fall seems to have worsened her pain. 06/21/18 on evaluation today patient appears to be doing about the same in regard to her lateral malleolus ulcers. Both appear to be just a little bit deeper but again we are clinging away the necrotic and dead tissue which I think is why this is progressing towards a deeper realm as opposed to improving from my measurement standpoint in that regard. Nonetheless she has been tolerating the dressing changes she absolutely hates the memory foam mattress topper that was obtained for her nonetheless I do believe this is still doing excellent as far as taking care of excess pressure in regard to the lateral malleolus regions. She in fact has no pressure injury that I see whereas in weeks past it was week by week I was constantly seeing new pressure injuries. Overall I think it has been very beneficial for her. 07/03/18; patient arrives in my clinic today. She has deep punched out areas over her bilateral lateral malleoli. The area on the right has some more depth. MERLINDA, WRUBEL ([id number]) We spent a lot of time today talking about pressure relief for these areas. This started when her daughter asked for a prescription for a memory foam mattress. I have  never written a prescription for a mattress and I don't think insurances would pay for that on an ordinary bed. In any case he came up that she has foam boots that she refuses to wear. I would suggest going to these before any other offloading issues when she is in bed. They say she is meticulous about offloading this the rest of the day 07/10/18- She is seen in follow-up evaluation for bilateral, lateral malleolus ulcers. There is no improvement in the ulcers. She has purchased and is sleeping on a memory foam mattress/overlay, she has been using the offloading boots nightly over the past week. She has a follow up appointment with vascular medicine at the end of October, in my opinion this follow up should be expedited given her deterioration and suboptimal TBI results. We will order plain film xray of the left ankle as deeper structures are palpable; would consider having MRI, regardless of xray report(s). The ulcers will be treated with iodoflex/iodosorb, she is unable to safely  change the dressings daily with santyl. 07/19/18 on evaluation today patient appears to be doing in general visually well in regard to her bilateral lateral malleolus ulcers. She has been tolerating the dressing changes without complication which is good news. With that being said we did have an x-ray performed on 07/12/18 which revealed a slight loosen see in the lateral portion of the distal left fibula which may represent artifact but underline lytic destruction or osteomyelitis could not be excluded. MRI was recommended. With that being said we can see about getting the patient scheduled for an MRI to further evaluate this area. In fact we have that scheduled currently for August 20 19,019. 07/26/18 on evaluation today patient's wound on the right lateral ankle actually appears to be doing fairly well at this point in my pinion. She has made some good progress currently. With that being said unfortunately in regard to the  left lateral ankle ulcer this seems to be a little bit more problematic at this time. In fact as I further evaluated the situation she actually had bone exposed which is the first time that's been the case in the bone appear to be necrotic. Currently I did review patient's note from Dr. Bunnie Domino office with [facility]. He stated that ABI was 1.26 on the right and 0.95 on the left with good waveforms. Her perfusion is stable not reduced from previous studies and her digital waveforms were pretty good particularly on the right. His conclusion upon review of the note was that there was not much she could do to improve her perfusion and he felt she was adequate for wound healing. His suggestion was that she continued to see [place] and consider a synthetic skin graft if there was no underlying infection. He plans to see her back in six months or as needed. 08/01/18 on evaluation today patient appears to be doing better in regard to her right lateral ankle ulcer. Her left lateral ankle ulcer is about the same she still has bone involvement in evidence of necrosis. There does not appear to be evidence of infection at this time On the right lateral lower extremity. I have started her on the Augmentin she picked this up and started this yesterday. This is to get her through until she sees infectious disease which is scheduled for 08/12/18. 08/06/18 on evaluation today patient appears to be doing rather well considering my discussion with patient's daughter at the end of last week. The area which was marked where she had erythema seems to be improved and this is good news. With that being said overall the patient seems to be making good improvement when it comes to the overall appearance of the right lateral ankle ulcer although this has been slow she at least is coming around in this regard. Unfortunately in regard to the left lateral ankle ulcer this is osteomyelitis based on the pathology  report as well is bone culture. Nonetheless we are still waiting CT scan. Unfortunately the MRI we originally ordered cannot be performed as the patient is a pacemaker which I had overlooked. Nonetheless we are working on the CT scan approval and scheduling as of now. She did go to the hospital over the weekend and was placed on IV Cefzo for a couple of days. Fortunately this seems to have improved the erythema quite significantly which is good news. There does not appear to be any evidence of worsening infection at this time. She did have some bleeding after the  last debridement therefore I did not perform any sharp debridement in regard to left lateral ankle at this point. Patient has been approved for a snap vac for the right lateral ankle. 08/14/18; the patient with wounds over her bilateral lateral malleoli. The area on the right actually looks quite good. Been using a snap back on this area. Healthy granulation and appears to be filling in. Unfortunately the area on the left is really problematic. 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 Also noted to have cellulitis. She saw Dr. Novella Olive of infectious disease today and was put on linezolid. We are able to verify this with her pharmacy. She is completed the Augmentin that she was already on. We've been using Iodoflex to this area 08/23/18 on evaluation today patient's wounds both actually appear to be doing better compared to my prior evaluations. Fortunately she showing signs of good improvement in regard to the overall wound status especially where were using the snap vac on the right. In regard to left lateral malleolus the wound bed actually appears to be much cleaner than previously noted. I do not feel any phone directly probed during evaluation today and though there is tendon noted this does not appear [place], [name] ([id number]) to be necrotic it's  actually fairly good as far as the overall appearance of the tendon is concerned. In general the wound bed actually appears to be doing significantly better than it was previous. Patient is currently in the care of [name] and I did review that note today. He actually has her on two weeks of linezolid and then following the patient will be on 1-2 months of Keflex. That is the plan currently. She has been on antibiotics therapy as prescribed by myself initially starting on July 30, 2018 and has been on that continuously up to this point. 08/30/18 on evaluation today patient actually appears to be doing much better in regard to her right lateral malleolus ulcer. She has been tolerating the dressing changes specifically the snap vac without complication although she did have some issues with the seal currently. Apparently there was some trouble with getting it to maintain over the past week past Sunday. Nonetheless overall the wound appears better in regard to the right lateral malleolus region. In regard to left lateral malleolus this actually show some signs of additional granulation although there still tendon noted in the base of the wound this appears to be healthy not necrotic in any way whatsoever. We are considering potentially using a snap vac for the left lateral malleolus as well the product wrap from [employer], [place], was present in the clinic today we're going to see this patient I did have her come in with me after obtaining consent from the patient and her daughter in order to look at the wound and see if there's any recommendation one way or another as to whether or not they felt the snapback could be beneficial for the left lateral malleolus region. But the conclusion was that it might be but that this is definitely a little bit deeper wound than what traditionally would be utilized for a snap vac. 09/06/18 on evaluation today patient actually appears to be doing excellent in my pinion in  regard to both ankle ulcers. She has been tolerating the dressing changes without complication which is great news. Specifically we have been using the snap vac. In regard to the right ankle I'm  not even sure that this is going to be necessary for today and following as the wound has filled in quite nicely. In regard to the left ankle I do believe that we're seeing excellent epithelialization from the edge as well as granulation in the central portion the tendon is still exposed but there's no evidence of necrotic bone and in general I feel like the patient has made excellent progress even compared to last week with just one week of the snap vac. 09/11/18; this is a patient who has wounds on her bilateral lateral malleoli. Initially both of these were deep stage IV wounds in the setting of chronic arterial insufficiency. She has been revascularized. As I understand think she been using snap vacs to both of these wounds however the area on the right became more superficial and currently she is only using it on the left. Using silver collagen on the right and silver collagen under the back on the left I believe 09/19/18 on evaluation today patient actually appears to be doing very well in regard to her lateral malleolus or ulcers bilaterally. She has been tolerating the dressing changes without complication. Fortunately there does not appear to be any evidence of infection at this time. Overall I feel like she is improving in an excellent manner and I'm very pleased with the fact that everything seems to be turning towards the better for her. This has obviously been a long road. 09/27/18 on evaluation today patient actually appears to be doing very well in regard to her bilateral lateral malleolus ulcers. She has been tolerating the dressing changes without complication. Fortunately there does not appear to be any evidence of infection at this time which is also great news. No fevers, chills, nausea, or  vomiting noted at this time. Overall I feel like she is doing excellent with the snap vac on the left malleolus. She had 40 mL of fluid collection over the past week. 10/04/18 on evaluation today patient actually appears to be doing well in regard to her bilateral lateral malleolus ulcers. She continues to tolerate the dressing changes without complication. One issue that I see is the snap vac on the left lateral malleolus which appears to have sealed off some fluid underlying this area and has not really allowed it to heal to the degree that I would like to see. For that reason I did suggest at this point we may want to pack a small piece of packing strip into this region to allow it to more effectively wick out fluid. 10/11/18 in general the patient today does not feel that she has been doing very well. She's been a little bit lethargic and subsequently is having bodyaches as well according to what she tells me today. With that being said overall she has been concerned with the fact that something may be worsening although to be honest her wounds really have not been appearing poorly. She does have a new ulcer on her left heel unfortunately. This may be pressure related. Nonetheless it seems to me to have potentially started at least as a blister I do not see any evidence of deep tissue injury. In regard to the left ankle the snap vac still seems to be causing the ceiling off of the deeper part of the wound which is in turn trapping fluid. I'm not extremely pleased with the overall appearance as far as progress from last week to this week therefore I'm gonna discontinue the snap vac at this point. 10/18/18 patient  unfortunately this point has not been feeling well for the past several days. She was seen by Grayland Ormond her primary care provider who is a Librarian, academic at [facility]. Subsequently she states that she's been very weak and generally feeling malaise. No fevers, chills,  nausea, or vomiting noted at this time. With that being said bloodwork was performed at the PCP office on the 11th of this month which showed a white blood cell count of 10.7. This was repeated today [name], [name] ([id number]) and shows a white blood cell count of 12.4. This does show signs of worsening. Coupled with the fact that she is feeling worse and that her left ankle wound is not really showing signs of improvement I feel like this is an indication that the osteomyelitis is likely exacerbating not improving. Overall I think we may also want to check her C-reactive protein and sedimentation rate. Actually did call [facility] office this afternoon while the patient was in the office here with me. Subsequently based on the findings we discussed treatment possibilities and I think that it is appropriate for [place] to go ahead and initiate treatment with doxycycline which I'm going to do. Subsequently he did agree to see about adding a CRP and sedimentation rate to her orders. If that has not already been drawn to where they can run it they will contact the patient she can come back to have that check. They are in agreement with plan as far as the patient and her daughter are concerned. Nonetheless also think we need to get in touch with Dr. Henreitta Leber office to see about getting the patient scheduled with him as soon as possible. 11/08/18 on evaluation today patient presents for follow-up concerning her bilateral foot and ankle ulcers. I did do an extensive review of her chart in epic today. Subsequently she was seen by [name] he did initiate Cefepime IV antibiotic therapy. Subsequently she had some issues with her PICC line this had to be removed because it was coiled and then replaced. Fortunately that was now settled. Unfortunately she has continued have issues with her left heel as well as the issues that she is experiencing with her bilateral lateral malleolus regions. I do believe  however both areas seem to be doing a little bit better on evaluation today which is good news. No fevers, chills, nausea, or vomiting noted at this time. She actually has an angiogram schedule with Dr. dew on this coming Monday, November 11, 2018. Subsequently the patient states that she is feeling much better especially than what she was roughly 2 weeks ago. She actually had to cancel an appointment because she was feeling so poorly. No fevers, chills, nausea, or vomiting noted at this time. 11/15/18 on evaluation today patient actually is status post having had her angiogram with Dr. dew Monday, four days ago. 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 He had to go and work on several areas of the vasculature fortunately he was able to obtain no more than a 30% residual stenosis throughout post procedure. I reviewed this note today. I think this will definitely help with healing at this time. Fortunately there does not appear to be any signs of infection and I do feel like ratio already has a better appearance to it. 11/22/18 upon evaluation today patient actually appears to be doing very well in regard to her wounds in general. The right lateral malleolus looks excellent the heel looks  better in the left lateral malleolus also appears to be doing a little better. With that being said the right second toe actually appears to be open and training we been watching this is been dry and stable but now is open. 12/03/2018 Seen today for follow-up and management of multiple bilateral lower extremity wounds. New pressure injury of the great toe which is closed at this time. Wound of the right distal second toe appears larger today with deep undermining and a pocket of fluid present within the undermining region. Left and right malleolus is wounds are stable today with no signs and symptoms of infection.Denies any needs or concerns during exam today. 12/13/18 on evaluation today  patient appears to be doing somewhat better in regard to her left heel ulcer. She also seems to be completely healed in regard to the right lateral malleolus ulcer. The left malleolus ulcer is smaller what unfortunately the wounds which are new over the first and second toes of the right foot are what are most concerning at this point especially the second. Both areas did require sharp debridement today. 12/20/18 on evaluation today patient's wound actually appears to be doing better in regard to left lateral ankle and her right lateral ankle continues to remain healed. The hill ulcer on the left is improved. She does have improvement noted as well in regard to both toe ulcers. Overall I'm very pleased in this regard. No fevers, chills, nausea, or vomiting noted at this time. 12/23/18 on evaluation today patient is seen after she had her toenails trimmed at the podiatrist office due to issues with her right great toe. There was what appeared to be dark eschar on the surface of the wound which had her in the podiatrist concerned. Nonetheless as I remember that during the last office visit I had utilize silver nitrate of this area I was much less concerned about the situation. Subsequently I was able to clean off much of this tissue without any complication today. This does not appear to show any signs of infection and actually look somewhat better compared to last time post debridement. Her second toe on the right foot actually had callous over and there did appear still be some fluid underneath this that would require debridement today. Electronic Signature(s) Signed: 12/26/2018 9:23:56 AM By: Worthy Keeler PA-C Entered By: Worthy Keeler on 12/26/2018 08:58:54 Ruhlman, [name] ([id number]) [name] ([id number]) -------------------------------------------------------------------------------- Physical Exam Details Patient Name: [name], [name] Date of Service: 12/23/2018 3:15  PM Medical Record Number: [id number] Patient Account Number: [id number] Date of Birth/Sex: 03-Feb-1923 (85 y.o. F) Treating RN: Harold Barban Primary Care Provider: Grayland Ormond Other Clinician: Referring Provider: Grayland Ormond Treating Provider/Extender: STONE III, HOYT Weeks in Treatment: 11 Constitutional Well-nourished and well-hydrated in no acute distress. Respiratory normal breathing without difficulty. Psychiatric this patient is able to make decisions and demonstrates good insight into disease process. Alert and Oriented x 3. pleasant and cooperative. Notes Patient's wounds today did require sharp debridement which the patient tolerated without complication. Fortunately there does not appear to be any evidence of infection at this time which is good news. No fevers, chills, nausea, or vomiting noted at this time. I do feel like she continues to show signs of improvement in general. Electronic Signature(s) Signed: 12/26/2018 9:23:56 AM By: Worthy Keeler PA-C Entered By: Worthy Keeler on 12/26/2018 08:59:42 [name], [name] ([id number]) -------------------------------------------------------------------------------- Physician Orders Details Patient Name: [name] Date of Service: 12/23/2018 3:15 PM Medical  Record Number: [id number] Patient Account Number: [id number] Date of Birth/Sex: Feb 05, 1923 (85 y.o. F) Treating RN: Cornell Barman Primary Care Provider: Grayland Ormond Other Clinician: Referring Provider: Grayland Ormond Treating Provider/Extender: Melburn Hake, HOYT Weeks in Treatment: 58 Verbal / Phone Orders: No Diagnosis Coding Wound Cleansing Wound #2 Left,Lateral Malleolus o Clean wound with Normal Saline. o Cleanse wound with mild soap and water o May Shower, gently pat wound dry prior to applying new dressing. Wound #3 Left Calcaneus o Clean wound with Normal Saline. o Cleanse wound with mild soap and water o May Shower, gently pat  wound dry prior to applying new dressing. Wound #4 Right,Distal Toe Second o Clean wound with Normal Saline. o Cleanse wound with mild soap and water o May Shower, gently pat wound dry prior to applying new dressing. Wound #5 Right,Distal Toe Great o Clean wound with Normal Saline. o Cleanse wound with mild soap and water o May Shower, gently pat wound dry prior to applying new dressing. Anesthetic (add to Medication List) Wound #2 Left,Lateral Malleolus o Topical Lidocaine 4% cream applied to wound bed prior to debridement (In Clinic Only). Wound #3 Left Calcaneus o Topical Lidocaine 4% cream applied to wound bed prior to debridement (In Clinic Only). Wound #4 Right,Distal Toe Second o Topical Lidocaine 4% cream applied to wound bed prior to debridement (In Clinic Only). Wound #5 Right,Distal Toe Great o Topical Lidocaine 4% cream applied to wound bed prior to debridement (In Clinic Only). Primary Wound Dressing Wound #2 Left,Lateral Malleolus o Other: - SNAP VAC with prisma to tunnel Wound #3 Left Calcaneus o Silver Collagen Wound #4 Right,Distal Toe Second o Silver Collagen Wound #5 Right,Distal Toe TALLULA, GRINDLE ([id number]) o Mupirocin Ointment - place in wound bed, then apply alginate on top o Silver Alginate Secondary Dressing Wound #3 Left Calcaneus o Boardered Foam Dressing - Please keep a bordered foam dressing on the right malleolus with bactroban Wound #4 Right,Distal Toe Second o Conform/Kerlix Wound #5 Right,Distal Toe Great o Conform/Kerlix Dressing Change Frequency Wound #2 Left,Lateral Malleolus o Change Dressing Monday, Wednesday, Friday Wound #3 Left Calcaneus o Change Dressing Monday, Wednesday, Friday Wound #4 Right,Distal Toe Second o Change Dressing Monday, Wednesday, Friday Wound #5 Right,Distal Toe Great o Change Dressing Monday, Wednesday, Friday Follow-up Appointments Wound #2 Left,Lateral  Malleolus o Return Appointment in 1 week. Wound #3 Left Calcaneus o Return Appointment in 1 week. Wound #4 Right,Distal Toe Second o Return Appointment in 1 week. - Return Friday 24th o Nurse Visit as needed Wound #5 Right,Distal Toe Great o Return Appointment in 1 week. - Return Friday 24th o Nurse Visit as needed Edema Control Wound #2 Left,Lateral Malleolus o Patient to wear own compression stockings o Patient to wear own Velcro compression garment. o Elevate legs to the level of the heart and pump ankles as often as possible Wound #3 Left Calcaneus o Patient to wear own compression stockings o Patient to wear own Velcro compression garment. o Elevate legs to the level of the heart and pump ankles as often as possible Wound #4 Right,Distal Toe Second o Patient to wear own compression stockings o Patient to wear own Velcro compression garment. [name], SOSNOWSKI ([id number]) o Elevate legs to the level of the heart and pump ankles as often as possible Wound #5 Right,Distal Toe Great o Patient to wear own compression stockings o Patient to wear own Velcro compression garment. o Elevate legs to the level of the heart and pump ankles as often  as possible Off-Loading Wound #2 Left,Lateral Malleolus o Turn and reposition every 2 hours o Other: - do not put pressure on your ankles Wound #3 Left Calcaneus o Turn and reposition every 2 hours o Other: - do not put pressure on your ankles Wound #4 Right,Distal Toe Second o Turn and reposition every 2 hours o Other: - do not put pressure on your ankles Wound #5 Right,Distal Toe Great o Turn and reposition every 2 hours o Other: - do not put pressure on your ankles Additional Orders / Instructions Wound #2 Left,Lateral Malleolus o Increase protein intake. o Increase protein intake. Wound #3 Left Calcaneus o Increase protein intake. o Increase protein intake. Wound #4  Right,Distal Toe Second o Increase protein intake. o Increase protein intake. Wound #5 Right,Distal Toe Great o Increase protein intake. o Increase protein intake. Home Health Wound #3 Left [place] Visits - [facility]. Please visit patient Monday, Wednesday, and Friday o Home Health Nurse may visit PRN to address patientos wound care needs. o FACE TO FACE ENCOUNTER: MEDICARE and MEDICAID PATIENTS: I certify that this patient is under my care and that I had a face-to-face encounter that meets the physician face-to-face encounter requirements with this patient on this date. The encounter with the patient was in whole or in part for the following MEDICAL CONDITION: (primary reason for [place]) MEDICAL NECESSITY: I certify, that based on my findings, NURSING services are a medically necessary home health service. HOME BOUND STATUS: I certify that my clinical findings support that this patient is homebound (i.e., Due to illness or injury, pt requires aid of supportive devices such as crutches, cane, wheelchairs, walkers, the use of special transportation or the assistance of another person to leave their place of residence. There is a normal inability to leave the home and doing so requires considerable and taxing effort. Other absences are for medical reasons / religious services and are infrequent or of short duration when for other reasons). DEVANIE, GALANTI ([id number]) o If current dressing causes regression in wound condition, may D/C ordered dressing product/s and apply Normal Saline Moist Dressing daily until next [place] / Other MD appointment. [place] of regression in wound condition at [phone]. o Please direct any NON-WOUND related issues/requests for orders to patient's Primary Care Physician Wound #4 Right,Distal Toe [place] Visits - [facility]. Please visit patient Monday,  Wednesday, and Friday o Home Health Nurse may visit PRN to address patientos wound care needs. o FACE TO FACE ENCOUNTER: MEDICARE and MEDICAID PATIENTS: I certify that this patient is under my care and that I had a face-to-face encounter that meets the physician face-to-face encounter requirements with this patient on this date. The encounter with the patient was in whole or in part for the following MEDICAL CONDITION: (primary reason for [place]) MEDICAL NECESSITY: I certify, that based on my findings, NURSING services are a medically necessary home health service. HOME BOUND STATUS: I certify that my clinical findings support that this patient is homebound (i.e., Due to illness or injury, pt requires aid of supportive devices such as crutches, cane, wheelchairs, walkers, the use of special transportation or the assistance of another person to leave their place of residence. There is a normal inability to leave the home and doing so requires considerable and taxing effort. Other absences are for medical reasons / religious services and are infrequent or of short duration when for other reasons). o If current  dressing causes regression in wound condition, may D/C ordered dressing product/s and apply Normal Saline Moist Dressing daily until next [place] / Other MD appointment. [place] of regression in wound condition at [phone]. o Please direct any NON-WOUND related issues/requests for orders to patient's Primary Care Physician Wound #5 Right,Distal Toe [place] Visits - [facility]. Please visit patient Monday, Wednesday, and Friday o Home Health Nurse may visit PRN to address patientos wound care needs. o FACE TO FACE ENCOUNTER: MEDICARE and MEDICAID PATIENTS: I certify that this patient is under my care and that I had a face-to-face encounter that meets the physician face-to-face encounter requirements with  this patient on this date. The encounter with the patient was in whole or in part for the following MEDICAL CONDITION: (primary reason for [place]) MEDICAL NECESSITY: I certify, that based on my findings, NURSING services are a medically necessary home health service. HOME BOUND STATUS: I certify that my clinical findings support that this patient is homebound (i.e., Due to illness or injury, pt requires aid of supportive devices such as crutches, cane, wheelchairs, walkers, the use of special transportation or the assistance of another person to leave their place of residence. There is a normal inability to leave the home and doing so requires considerable and taxing effort. Other absences are for medical reasons / religious services and are infrequent or of short duration when for other reasons). o If current dressing causes regression in wound condition, may D/C ordered dressing product/s and apply Normal Saline Moist Dressing daily until next [place] / Other MD appointment. [place] of regression in wound condition at [phone]. o Please direct any NON-WOUND related issues/requests for orders to patient's Primary Care Physician Patient Medications Allergies: Sulfa (Sulfonamide Antibiotics), metronidazole, monistat Notifications Medication Indication Start End mupirocin 12/23/2018 DOSE topical 2 % ointment - ointment topical applied in a thin film to the affected wound with each dressing change as directed Electronic Signature(s) Signed: 12/23/2018 5:07:46 PM By: Worthy Keeler PA-C Entered By: Worthy Keeler on 12/23/2018 17:07:45 Yoak, [name] ([id number]) -------------------------------------------------------------------------------- Problem List Details Patient Name: [name] Date of Service: 12/23/2018 3:15 PM Medical Record Number: [id number] Patient Account Number: [id number] Date of Birth/Sex: September 30, 1923 (85 y.o.  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 08/10/2017 No Yes ankle I70.243 Atherosclerosis of native arteries of left leg with ulceration of 07/10/2018 No Yes ankle I89.0 Lymphedema, not elsewhere classified 08/10/2017 No Yes B35.4 Tinea corporis 09/28/2017 No Yes 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, left ankle and foot 08/14/2018 No Yes Inactive Problems Resolved Problems LAVIDA, PATCH ([id number]) ICD-10 Code Description Active Date Resolved Date S90.822A Blister (nonthermal), left foot, initial encounter 10/11/2018 10/11/2018 Electronic Signature(s) Signed: 12/26/2018 9:23:56 AM By: Worthy Keeler PA-C Entered By: Worthy Keeler on 12/23/2018 15:40:25 Mccauslin, [name] ([id number]) -------------------------------------------------------------------------------- Progress Note Details Patient Name: [name] Date of Service: 12/23/2018 3:15 PM Medical Record Number: [id number] Patient Account Number: [id number]  Date of Birth/Sex: 05/30/1923 (85 y.o. 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 Dr. Amalia Hailey the podiatrist saw her on 07/20/2017 and sent her to the wound center for possible hyperbaric oxygen therapy. past medical history of peripheral vascular disease, varicose veins, status post appendectomy, basal cell carcinoma excision from the left leg, cholecystectomy, pacemaker placement, right lower extremity angiography done by Dr. dew in March 2017 with placement of a stent. there is also note of a successful ablation of the right small saphenous vein done which was reviewed by ultrasound on 10/24/2016. the patient had a right small saphenous vein ablation done on 10/20/2016. The patient has never been a smoker. She has been seen by Dr. Corene Cornea dew the vascular surgeon who most recently saw her on 06/15/2017 for evaluation of ongoing problems with right leg swelling. She had a lower extremity arterial duplex examination done(02/13/17) which showed patent distal right superficial femoral artery stent and above-the-knee popliteal stent without evidence of restenosis. The ABI was more than 1.3 on the right and more than 1.3 on the left. This was consistent with noncompressible arteries due to medial calcification. The right great toe pressure and PPG waveforms are within normal limits and the left great toe pressure and PPG waveforms are decreased. he recommended she continue to wear her compression stockings and continue with elevation. She is scheduled to have a noninvasive arterial study in the near future 08/16/2017 -- had a lower extremity arterial duplex examination done which showed patent distal right superficial femoral artery stent and above-the-knee popliteal stent without evidence of restenosis. The ABI was more than 1.3 on the right and more than 1.3 on the  left. This was consistent with noncompressible arteries due to medial calcification. The right great toe pressure and PPG waveforms are within normal limits and the left great toe pressure and PPG waveforms are decreased. the x-ray of the right ankle has not yet been done 08/24/2017 -- had a right ankle x-ray -- IMPRESSION:1. No fracture, bone lesion or evidence of osteomyelitis. 2. Lateral soft tissue swelling with a soft tissue ulcer. she has not yet seen the vascular surgeon for review 08/31/17 on evaluation today patient's wound appears to be showing signs of improvement. She still with her appointment with vascular in order to review her results of her vascular study and then determine if any intervention would be recommended at that time. No fevers, chills, nausea, or vomiting noted at this time. She has been tolerating the dressing changes without complication. 09/28/17 on evaluation today patient's wound appears to show signs of good improvement in regard to the granulation tissue which is surfacing. There is still a layer of slough covering the wound and the posterior portion is still significantly deeper than the anterior nonetheless there has been some good sign of things moving towards the better. She is going to go back to Dr. dew for reevaluation to ensure her blood flow is still appropriate. That will be before her next evaluation with [place] next week. No fevers, chills, nausea, or vomiting noted at this time. Patient  does have some discomfort rated to be a 3-4/10 depending on activity specifically cleansing the wound makes it worse. 10/05/2017 -- the patient was seen by Dr. Lucky Cowboy last week and noninvasive studies showed a normal right ABI with brisk TYYNE, CLIETT. ([id number]) triphasic waveforms consistent with no arterial insufficiency including normal digital pressures. The duplex showed a patent distal right SFA stent and the proximal SFA was also normal. He was pleased with her  test and thought she should have enough of perfusion for normal wound healing. He would see her back in 6 months time. 12/21/17 on evaluation today patient appears to be doing fairly well in regard to her right lateral ankle wound. Unfortunately the main issue that she is expansion at this point is that she is having some issues with what appears to be some cellulitis in the right anterior shin. She has also been noting a little bit of uncomfortable feeling especially last night and her ankle area. I'm afraid that she made the developing a little bit of an infection. With that being said I think it is in the early stages. 12/28/17 on evaluation today patient's ankle appears to be doing excellent. She's making good progress at this point the cellulitis seems to have improved after last week's evaluation. Overall she is having no significant discomfort which is excellent news. She does have an appointment with Dr. dew on March 29, 2018 for reevaluation in regard to the stent he placed. She seems to have excellent blood flow in the right lower extremity. 01/19/12 on evaluation today patient's wound appears to be doing very well. In fact she does not appear to require debridement at this point, there's no evidence of infection, and overall from the standpoint of the wound she seems to be doing very well. With that being said I believe that it may be time to switch to different dressing away from the [facility] Dressing she tells me she does have a lot going on her friend actually passed away yesterday and she's also having a lot of issues with her husband this obviously is weighing heavy on her as far as your thoughts and concerns today. 01/25/18 on evaluation today patient appears to be doing fairly well in regard to her right lateral malleolus. She has been tolerating the dressing changes without complication. Overall I feel like this is definitely showing signs of improvement as far as how the  overall appearance of the wound is there's also evidence of epithelium start to migrate over the granulation tissue. In general I think that she is progressing nicely as far as the wound is concerned. The only concern she really has is whether or not we can switch to every other week visits in order to avoid having as many appointments as her daughters have a difficult time getting her to her appointments as well as the patient's husband to his he is not doing very well at this point. 02/22/18 on evaluation today patient's right lateral malleolus ulcer appears to be doing great. She has been tolerating the dressing changes without complication. Overall you making excellent progress at this time. Patient is having no significant discomfort. 03/15/18 on evaluation today patient appears to be doing much more poorly in regard to her right lateral ankle ulcer at this point. Unfortunately since have last seen her her husband has passed just a few days ago is obviously weighed heavily on her her daughter also had surgery well she is with her today as usual. There does  not appear to be any evidence of infection she does seem to have significant contusion/deep tissue injury to the right lateral malleolus which was not noted previous when I saw her last. It's hard to tell of exactly when this injury occurred although during the time she was spending the night in the hospital this may have been most likely. 03/22/18 on evaluation today patient appears to actually be doing very well in regard to her ulcer. She did unfortunately have a setback which was noted last week however the good news is we seem to be getting back on track and in fact the wound in the core did still have some necrotic tissue which will be addressed at this point today but in general I'm seeing signs that things are on the up and up. She is glad to hear this obviously she's been somewhat concerned that due to the how her wound digressed more  recently. 03/29/18 on evaluation today patient appears to be doing fairly well in regard to her right lower extremity lateral malleolus ulcer. She unfortunately does have a new area of pressure injury over the inferior portion where the wound has opened up a little bit larger secondary to the pressure she seems to be getting. She does tell me sometimes when she sleeps at night that it actually hurts and does seem to be pushing on the area little bit more unfortunately. There does not appear to be any evidence of infection which is good news. She has been tolerating the dressing changes without complication. She also did have some bruising in the left second and third toes due to the fact that she may have bump this or injured it although she has neuropathy so she does not feel she did move recently that may have been where this came from. Nonetheless there does not appear to be any evidence of infection at this time. 04/12/18 on evaluation today patient's wound on the right lateral ankle actually appears to be doing a little bit better with a lot of necrotic docking tissue centrally loosening up in clearing away. However she does have the beginnings of a deep tissue injury on the left lateral malleolus likely due to the fact we've been trying offload the right as much as we have. I think she may benefit from an assistive soft device to help with offloading and it looks like they're looking at one of the doughnut conditions that wraps around the lower leg to offload which I think will definitely do a good job. With that being said I think we definitely need to address this issue on the left before it becomes a wound. Patient is not having significant pain. BALJIT, LIEBERT ([id number]) 04/19/18 on evaluation today patient appears to be doing excellent in regard to the progress she's made with her right lateral ankle ulcer. The left ankle region which did show evidence of a deep tissue injury seems to  be resolving there's little fluid noted underneath and a blister there's nothing open at this point in time overall I feel like this is progressing nicely which is good news. She does not seem to be having significant discomfort at this point which is also good news. 04/25/18-She is here in follow up evaluation for bilateral lateral malleolar ulcers. The right lateral malleolus ulcer with pale subcutaneous tissue exposure, central area of ulcer with tendon/periosteum exposed. The left lateral malleolus ulcer now with central area of nonviable tissue, otherwise deep tissue injury. She is wearing compression wraps to  the left lower extremity, she will place the right lower extremity compression wraps on when she gets home. She will be out of town over the weekend and return next week and follow-up appointment. She completed her doxycycline this morning 05/03/18 on evaluation today patient appears to be doing very well in regard to her right lateral ankle ulcer in general. At least she's showing some signs of improvement in this regard. Unfortunately she has some additional injury to the left lateral malleolus region which appears to be new likely even over the past several days. Again this determination is based on the overall appearance. With that being said the patient is obviously frustrated about this currently. 05/10/18-She is here in follow-up evaluation for bilateral lateral malleolar ulcers. She states she has purchased offloading shoes/boots and they will arrive tomorrow. She was asked to bring them in the office at next week's appointment so her provider is aware of product being utilized. She continues to sleep on right or left side, she has been encouraged to sleep on her back. The right lateral malleolus ulcer is precariously close to peri-osteum; will order xray. The left lateral malleolus ulcer is improved. Will switch back to santyl; she will follow up next week. 05/17/18 on evaluation  today patient actually appears to be doing very well in regard to her malleolus her ulcers compared to last time I saw them. She does not seem to have as much in the way of contusion at this point which is great news. With that being said she does continue to have discomfort and I do believe that she is still continuing to benefit from the offloading/pressure reducing boots that were recommended. I think this is the key to trying to get this to heal up completely. 05/24/18 on evaluation today patient actually appears to be doing worse at this point in time unfortunately compared to her last week's evaluation. She is having really no increased pain which is good news unfortunately she does have more maceration in your theme and noted surrounding the right lateral ankle the left lateral ankle is not really is erythematous I do not see signs of the overt cellulitis on that side. Unfortunately the wounds do not seem to have shown any signs of improvement since the last evaluation. She also has significant swelling especially on the right compared to previous some of this may be due to infection however also think that she may be served better while she has these wounds by compression wrapping versus continuing to use the Juxta-Lite for the time being. Especially with the amount of drainage that she is experiencing at this point. No fevers, chills, nausea, or vomiting noted at this time. 05/31/18 on evaluation today patient appears to actually be doing better in regard to her right lateral lower extremity ulcer specifically on the malleolus region. She has been tolerating the antibiotic without complication. With that being said she still continues to have issues but a little bit of redness although nothing like she what she was experiencing previous. She still continues to pressure to her ankle area she did get the problem on offloading boots unfortunately she will not wear them she states there too  uncomfortable and she can't get in and out of the bed. Nonetheless at this point her wounds seem to be continually getting worse which is not what we want I'm getting somewhat concerned about her progress and how things are going to proceed if we do not intervene in some way shape or form.  I therefore had a very lengthy conversation today about offloading yet again and even made a specific suggestion for switching her to a memory foam mattress and even gave the information for a specific one that they could look at getting if it was something that they were interested in considering. She does not want to be considered for a hospital bed air mattress although honestly insurance would not cover it that she does not have any wounds on her trunk. 06/14/18 on evaluation today both wounds over the bilateral lateral malleolus her ulcers appear to be doing better there's no evidence of pressure injury at this point. She did get the foam mattress for her bed and this does seem to have been extremely beneficial for her in my pinion. Her daughter states that she is having difficulty getting out of bed because of how soft it is. The patient also relates this to be. Nonetheless I do feel like she's actually doing better. Unfortunately right after and around the time she was getting the mattress she also sustained a fall when she got up to go pick up the phone and ended up injuring her right elbow she has 18 sutures in place. We are not caring for this currently although home health is going to be taking the sutures out shortly. Nonetheless this may be something that we need to evaluate going forward. It depends on how well it has or has not healed in the end. She also recently saw an orthopedic specialist for an injection in the right shoulder just before her fall unfortunately the fall seems to have worsened her pain. 06/21/18 on evaluation today patient appears to be doing about the same in regard to her lateral  malleolus ulcers. Both appear to be just a little bit deeper but again we are clinging away the necrotic and dead tissue which I think is why this is progressing towards a deeper realm as opposed to improving from my measurement standpoint in that regard. Nonetheless she has been tolerating the dressing changes she absolutely hates the memory foam mattress topper that was obtained for her nonetheless Jadelyn, [place] [name] ([id number]) I do believe this is still doing excellent as far as taking care of excess pressure in regard to the lateral malleolus regions. She in fact has no pressure injury that I see whereas in weeks past it was week by week I was constantly seeing new pressure injuries. Overall I think it has been very beneficial for her. 07/03/18; patient arrives in my clinic today. She has deep punched out areas over her bilateral lateral malleoli. The area on the right has some more depth. We spent a lot of time today talking about pressure relief for these areas. This started when her daughter asked for a prescription for a memory foam mattress. I have never written a prescription for a mattress and I don't think insurances would pay for that on an ordinary bed. In any case he came up that she has foam boots that she refuses to wear. I would suggest going to these before any other offloading issues when she is in bed. They say she is meticulous about offloading this the rest of the day 07/10/18- She is seen in follow-up evaluation for bilateral, lateral malleolus ulcers. There is no improvement in the ulcers. She has purchased and is sleeping on a memory foam mattress/overlay, she has been using the offloading boots nightly over the past week. She has a follow up appointment with vascular medicine at the  end of October, in my opinion this follow up should be expedited given her deterioration and suboptimal TBI results. We will order plain film xray of the left ankle as deeper structures are  palpable; would consider having MRI, regardless of xray report(s). The ulcers will be treated with iodoflex/iodosorb, she is unable to safely change the dressings daily with santyl. 07/19/18 on evaluation today patient appears to be doing in general visually well in regard to her bilateral lateral malleolus ulcers. She has been tolerating the dressing changes without complication which is good news. With that being said we did have an x-ray performed on 07/12/18 which revealed a slight loosen see in the lateral portion of the distal left fibula which may represent artifact but underline lytic destruction or osteomyelitis could not be excluded. MRI was recommended. With that being said we can see about getting the patient scheduled for an MRI to further evaluate this area. In fact we have that scheduled currently for August 20 19,019. 07/26/18 on evaluation today patient's wound on the right lateral ankle actually appears to be doing fairly well at this point in my pinion. She has made some good progress currently. With that being said unfortunately in regard to the left lateral ankle ulcer this seems to be a little bit more problematic at this time. In fact as I further evaluated the situation she actually had bone exposed which is the first time that's been the case in the bone appear to be necrotic. Currently I did review patient's note from Dr. Bunnie Domino office with [facility]. He stated that ABI was 1.26 on the right and 0.95 on the left with good waveforms. Her perfusion is stable not reduced from previous studies and her digital waveforms were pretty good particularly on the right. His conclusion upon review of the note was that there was not much she could do to improve her perfusion and he felt she was adequate for wound healing. His suggestion was that she continued to see [place] and consider a synthetic skin graft if there was no underlying infection. He plans to see her back in  six months or as needed. 08/01/18 on evaluation today patient appears to be doing better in regard to her right lateral ankle ulcer. Her left lateral ankle ulcer is about the same she still has bone involvement in evidence of necrosis. There does not appear to be evidence of infection at this time On the right lateral lower extremity. I have started her on the Augmentin she picked this up and started this yesterday. This is to get her through until she sees infectious disease which is scheduled for 08/12/18. 08/06/18 on evaluation today patient appears to be doing rather well considering my discussion with patient's daughter at the end of last week. The area which was marked where she had erythema seems to be improved and this is good news. With that being said overall the patient seems to be making good improvement when it comes to the overall appearance of the right lateral ankle ulcer although this has been slow she at least is coming around in this regard. Unfortunately in regard to the left lateral ankle ulcer this is osteomyelitis based on the pathology report as well is bone culture. Nonetheless we are still waiting CT scan. Unfortunately the MRI we originally ordered cannot be performed as the patient is a pacemaker which I had overlooked. Nonetheless we are working on the CT scan approval and scheduling as of now. She  did go to the hospital over the weekend and was placed on IV Cefzo for a couple of days. Fortunately this seems to have improved the erythema quite significantly which is good news. There does not appear to be any evidence of worsening infection at this time. She did have some bleeding after the last debridement therefore I did not perform any sharp debridement in regard to left lateral ankle at this point. Patient has been approved for a snap vac for the right lateral ankle. 08/14/18; the patient with wounds over her bilateral lateral malleoli. The area on the right actually looks  quite good. Been using a snap back on this area. Healthy granulation and appears to be filling in. Unfortunately the area on the left is really problematic. 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 Also noted to have cellulitis. She saw Dr. Novella Olive of infectious disease today and was put on linezolid. We are able to verify this with her pharmacy. She is completed the Augmentin that she was already on. We've been using Iodoflex to this ALLICIA, CULLEY ([id number]) area 08/23/18 on evaluation today patient's wounds both actually appear to be doing better compared to my prior evaluations. Fortunately she showing signs of good improvement in regard to the overall wound status especially where were using the snap vac on the right. In regard to left lateral malleolus the wound bed actually appears to be much cleaner than previously noted. I do not feel any phone directly probed during evaluation today and though there is tendon noted this does not appear to be necrotic it's actually fairly good as far as the overall appearance of the tendon is concerned. In general the wound bed actually appears to be doing significantly better than it was previous. Patient is currently in the care of [name] and I did review that note today. He actually has her on two weeks of linezolid and then following the patient will be on 1-2 months of Keflex. That is the plan currently. She has been on antibiotics therapy as prescribed by myself initially starting on July 30, 2018 and has been on that continuously up to this point. 08/30/18 on evaluation today patient actually appears to be doing much better in regard to her right lateral malleolus ulcer. She has been tolerating the dressing changes specifically the snap vac without complication although she did have some issues with the seal currently. Apparently there was some trouble with  getting it to maintain over the past week past Sunday. Nonetheless overall the wound appears better in regard to the right lateral malleolus region. In regard to left lateral malleolus this actually show some signs of additional granulation although there still tendon noted in the base of the wound this appears to be healthy not necrotic in any way whatsoever. We are considering potentially using a snap vac for the left lateral malleolus as well the product wrap from [employer], [place], was present in the clinic today we're going to see this patient I did have her come in with me after obtaining consent from the patient and her daughter in order to look at the wound and see if there's any recommendation one way or another as to whether or not they felt the snapback could be beneficial for the left lateral malleolus region. But the conclusion was that it might be but that this is definitely a little bit deeper wound than what traditionally would  be utilized for a snap vac. 09/06/18 on evaluation today patient actually appears to be doing excellent in my pinion in regard to both ankle ulcers. She has been tolerating the dressing changes without complication which is great news. Specifically we have been using the snap vac. In regard to the right ankle I'm not even sure that this is going to be necessary for today and following as the wound has filled in quite nicely. In regard to the left ankle I do believe that we're seeing excellent epithelialization from the edge as well as granulation in the central portion the tendon is still exposed but there's no evidence of necrotic bone and in general I feel like the patient has made excellent progress even compared to last week with just one week of the snap vac. 09/11/18; this is a patient who has wounds on her bilateral lateral malleoli. Initially both of these were deep stage IV wounds in the setting of chronic arterial insufficiency. She has been revascularized.  As I understand think she been using snap vacs to both of these wounds however the area on the right became more superficial and currently she is only using it on the left. Using silver collagen on the right and silver collagen under the back on the left I believe 09/19/18 on evaluation today patient actually appears to be doing very well in regard to her lateral malleolus or ulcers bilaterally. She has been tolerating the dressing changes without complication. Fortunately there does not appear to be any evidence of infection at this time. Overall I feel like she is improving in an excellent manner and I'm very pleased with the fact that everything seems to be turning towards the better for her. This has obviously been a long road. 09/27/18 on evaluation today patient actually appears to be doing very well in regard to her bilateral lateral malleolus ulcers. She has been tolerating the dressing changes without complication. Fortunately there does not appear to be any evidence of infection at this time which is also great news. No fevers, chills, nausea, or vomiting noted at this time. Overall I feel like she is doing excellent with the snap vac on the left malleolus. She had 40 mL of fluid collection over the past week. 10/04/18 on evaluation today patient actually appears to be doing well in regard to her bilateral lateral malleolus ulcers. She continues to tolerate the dressing changes without complication. One issue that I see is the snap vac on the left lateral malleolus which appears to have sealed off some fluid underlying this area and has not really allowed it to heal to the degree that I would like to see. For that reason I did suggest at this point we may want to pack a small piece of packing strip into this region to allow it to more effectively wick out fluid. 10/11/18 in general the patient today does not feel that she has been doing very well. She's been a little bit lethargic  and subsequently is having bodyaches as well according to what she tells me today. With that being said overall she has been concerned with the fact that something may be worsening although to be honest her wounds really have not been appearing poorly. She does have a new ulcer on her left heel unfortunately. This may be pressure related. Nonetheless it seems to me to have potentially started at least as a blister I do not see any evidence of deep tissue injury. In regard to the left  ankle the snap vac still seems to be causing the ceiling off of the deeper part of the wound which is in turn trapping fluid. I'm not extremely pleased with the overall appearance as far as progress from last week to this week therefore I'm gonna discontinue Georgi, [name] ([id number]) the snap vac at this point. 10/18/18 patient unfortunately this point has not been feeling well for the past several days. She was seen by Grayland Ormond her primary care provider who is a Librarian, academic at [facility]. Subsequently she states that she's been very weak and generally feeling malaise. No fevers, chills, nausea, or vomiting noted at this time. With that being said bloodwork was performed at the PCP office on the 11th of this month which showed a white blood cell count of 10.7. This was repeated today and shows a white blood cell count of 12.4. This does show signs of worsening. Coupled with the fact that she is feeling worse and that her left ankle wound is not really showing signs of improvement I feel like this is an indication that the osteomyelitis is likely exacerbating not improving. Overall I think we may also want to check her C-reactive protein and sedimentation rate. Actually did call [facility] office this afternoon while the patient was in the office here with me. Subsequently based on the findings we discussed treatment possibilities and I think that it is appropriate for [place] to go ahead and  initiate treatment with doxycycline which I'm going to do. Subsequently he did agree to see about adding a CRP and sedimentation rate to her orders. If that has not already been drawn to where they can run it they will contact the patient she can come back to have that check. They are in agreement with plan as far as the patient and her daughter are concerned. Nonetheless also think we need to get in touch with Dr. Henreitta Leber office to see about getting the patient scheduled with him as soon as possible. 11/08/18 on evaluation today patient presents for follow-up concerning her bilateral foot and ankle ulcers. I did do an extensive review of her chart in epic today. Subsequently she was seen by [name] he did initiate Cefepime IV antibiotic therapy. Subsequently she had some issues with her PICC line this had to be removed because it was coiled and then replaced. Fortunately that was now settled. Unfortunately she has continued have issues with her left heel as well as the issues that she is experiencing with her bilateral lateral malleolus regions. I do believe however both areas seem to be doing a little bit better on evaluation today which is good news. No fevers, chills, nausea, or vomiting noted at this time. She actually has an angiogram schedule with Dr. dew on this coming Monday, November 11, 2018. Subsequently the patient states that she is feeling much better especially than what she was roughly 2 weeks ago. She actually had to cancel an appointment because she was feeling so poorly. No fevers, chills, nausea, or vomiting noted at this time. 11/15/18 on evaluation today patient actually is status post having had her angiogram with Dr. dew Monday, four days ago. 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 He had to go and work on several areas of the vasculature fortunately he was able to obtain no more than a 30% residual stenosis throughout post procedure. I reviewed  this note today. I think this will definitely help with healing  at this time. Fortunately there does not appear to be any signs of infection and I do feel like ratio already has a better appearance to it. 11/22/18 upon evaluation today patient actually appears to be doing very well in regard to her wounds in general. The right lateral malleolus looks excellent the heel looks better in the left lateral malleolus also appears to be doing a little better. With that being said the right second toe actually appears to be open and training we been watching this is been dry and stable but now is open. 12/03/2018 Seen today for follow-up and management of multiple bilateral lower extremity wounds. New pressure injury of the great toe which is closed at this time. Wound of the right distal second toe appears larger today with deep undermining and a pocket of fluid present within the undermining region. Left and right malleolus is wounds are stable today with no signs and symptoms of infection.Denies any needs or concerns during exam today. 12/13/18 on evaluation today patient appears to be doing somewhat better in regard to her left heel ulcer. She also seems to be completely healed in regard to the right lateral malleolus ulcer. The left malleolus ulcer is smaller what unfortunately the wounds which are new over the first and second toes of the right foot are what are most concerning at this point especially the second. Both areas did require sharp debridement today. 12/20/18 on evaluation today patient's wound actually appears to be doing better in regard to left lateral ankle and her right lateral ankle continues to remain healed. The hill ulcer on the left is improved. She does have improvement noted as well in regard to both toe ulcers. Overall I'm very pleased in this regard. No fevers, chills, nausea, or vomiting noted at this time. 12/23/18 on evaluation today patient is seen after she had her toenails  trimmed at the podiatrist office due to issues with her right great toe. There was what appeared to be dark eschar on the surface of the wound which had her in the podiatrist concerned. Nonetheless as I remember that during the last office visit I had utilize silver nitrate of this area I was much less concerned about the situation. Subsequently I was able to clean off much of this tissue without any complication today. This does not appear to show any signs of infection and actually look somewhat better compared to last time post debridement. Her second toe on the right foot actually had callous over and there did appear still be some fluid underneath this that would require debridement today. NADEAN, MONTANARO ([id number]) Patient History Information obtained from Patient. Family History Cancer - Father,Siblings, Heart Disease - Siblings, No family history of Diabetes, Hereditary Spherocytosis, Hypertension, Kidney Disease, Lung Disease, Seizures, Stroke, Thyroid Problems, Tuberculosis. Social History Never smoker, Marital Status - Married, Alcohol Use - Never, Drug Use - No History, Caffeine Use - Rarely. Review of Systems (ROS) Constitutional Symptoms (General Health) Denies complaints or symptoms of Fever, Chills. Respiratory The patient has no complaints or symptoms. Cardiovascular The patient has no complaints or symptoms. Psychiatric The patient has no complaints or symptoms. Objective Constitutional Well-nourished and well-hydrated in no acute distress. Vitals Time Taken: 3:20 PM, Height: 65 in, Weight: 154.3 lbs, BMI: 25.7, Temperature: 98.0 F, Pulse: 67 bpm, Respiratory Rate: 16 breaths/min, Blood Pressure: 126/58 mmHg. Respiratory normal breathing without difficulty. Psychiatric this patient is able to make decisions and demonstrates good insight into disease process. Alert and Oriented x 3.  pleasant and cooperative. General Notes: Patient's wounds today did require  sharp debridement which the patient tolerated without complication. Fortunately there does not appear to be any evidence of infection at this time which is good news. No fevers, chills, nausea, or vomiting noted at this time. I do feel like she continues to show signs of improvement in general. Integumentary (Hair, Skin) Wound #4 status is Open. Original cause of wound was Pressure Injury. The wound is located on the Right,Distal Toe Second. The wound measures 0.2cm length x 0.3cm width x 0.4cm depth; 0.047cm^2 area and 0.019cm^3 volume. Wound #5 status is Open. Original cause of wound was Footwear Injury. The wound is located on the Right,Distal [employer] The wound measures 0.4cm length x 0.4cm width x 0.1cm depth; 0.126cm^2 area and 0.013cm^3 volume. 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 There was a Excisional Skin/Subcutaneous Tissue Debridement with a total area of 0.06 sq cm performed by STONE III, HOYT E., PA-C. With the following instrument(s): Curette to remove Viable and Non-Viable tissue/material. Material removed includes Callus, Subcutaneous Tissue, and Slough after achieving pain control using Lidocaine. No specimens were taken. A time out was conducted at 16:02, prior to the start of the procedure. A Minimum amount of bleeding was controlled with Pressure. The procedure was tolerated well with a pain level of 0 throughout and a pain  level of 0 following the procedure. Post Debridement Measurements: 0.2cm length x 0.3cm width x 0.4cm depth; 0.019cm^3 volume. Post debridement Stage noted as Category/Stage II. Character of Wound/Ulcer Post Debridement is improved. Post procedure Diagnosis Wound #4: Same as Pre-Procedure Wound #5 Pre-procedure diagnosis of Wound #5 is a Pressure Ulcer located on the Right,Distal Toe Great . There was a Excisional Skin/Subcutaneous Tissue Debridement with a total area of 0.16 sq cm performed by STONE III, HOYT E., PA-C. With the following instrument(s): Curette to remove Viable and Non-Viable tissue/material. Material removed includes Callus, Subcutaneous Tissue, and Slough after achieving pain control using Lidocaine. No specimens were taken. A time out was conducted at 16:02, prior to the start of the procedure. A Minimum amount of bleeding was controlled with Pressure. The procedure was tolerated well with a pain level of 0 throughout and a pain level of 0 following the procedure. Post Debridement Measurements: 0.4cm length x 0.4cm width x 0.1cm depth; 0.013cm^3 volume. Post debridement Stage noted as Category/Stage II. Character of Wound/Ulcer Post Debridement is improved. Post procedure Diagnosis Wound #5: Same as Pre-Procedure Plan Wound Cleansing: Wound #2 Left,Lateral Malleolus: [name], [name] ([id number]) Clean wound with Normal Saline. Cleanse wound with mild soap and water May Shower, gently pat wound dry prior to applying new dressing. Wound #3 Left Calcaneus: Clean wound with Normal Saline. Cleanse wound with mild soap and water May Shower, gently pat wound dry prior to applying new dressing. Wound #4 Right,Distal Toe Second: Clean wound with Normal Saline. Cleanse wound with mild soap and water May Shower, gently pat wound dry prior to applying new dressing. Wound #5 Right,Distal Toe Great:  Clean wound with Normal Saline. Cleanse wound with mild soap and water May  Shower, gently pat wound dry prior to applying new dressing. Anesthetic (add to Medication List): Wound #2 Left,Lateral Malleolus: Topical Lidocaine 4% cream applied to wound bed prior to debridement (In Clinic Only). Wound #3 Left Calcaneus: Topical Lidocaine 4% cream applied to wound bed prior to debridement (In Clinic Only). Wound #4 Right,Distal Toe Second: Topical Lidocaine 4% cream applied to wound bed prior to debridement (In Clinic Only). Wound #5 Right,Distal Toe Great: Topical Lidocaine 4% cream applied to wound bed prior to debridement (In Clinic Only). Primary Wound Dressing: Wound #2 Left,Lateral Malleolus: Other: - SNAP VAC with prisma to tunnel Wound #3 Left Calcaneus: Silver Collagen Wound #4 Right,Distal Toe Second: Silver Collagen Wound #5 Right,Distal Toe Great: Mupirocin Ointment - place in wound bed, then apply alginate on top Silver Alginate Secondary Dressing: Wound #3 Left Calcaneus: Boardered Foam Dressing - Please keep a bordered foam dressing on the right malleolus with bactroban Wound #4 Right,Distal Toe Second: Conform/Kerlix Wound #5 Right,Distal Toe Great: Conform/Kerlix Dressing Change Frequency: Wound #2 Left,Lateral Malleolus: Change Dressing Monday, Wednesday, Friday Wound #3 Left Calcaneus: Change Dressing Monday, Wednesday, Friday Wound #4 Right,Distal Toe Second: Change Dressing Monday, Wednesday, Friday Wound #5 Right,Distal Toe Great: Change Dressing Monday, Wednesday, Friday Follow-up Appointments: Wound #2 Left,Lateral Malleolus: Return Appointment in 1 week. Wound #3 Left Calcaneus: Return Appointment in 1 week. Wound #4 Right,Distal Toe Second: Return Appointment in 1 week. - Return Friday 24th Nurse Visit as needed Wound #5 Right,Distal Toe Great: Return Appointment in 1 week. - Return Friday 24th CONCETTINA, LETH ([id number]) Nurse Visit as needed Edema Control: Wound #2 Left,Lateral Malleolus: Patient to wear own  compression stockings Patient to wear own Velcro compression garment. Elevate legs to the level of the heart and pump ankles as often as possible Wound #3 Left Calcaneus: Patient to wear own compression stockings Patient to wear own Velcro compression garment. Elevate legs to the level of the heart and pump ankles as often as possible Wound #4 Right,Distal Toe Second: Patient to wear own compression stockings Patient to wear own Velcro compression garment. Elevate legs to the level of the heart and pump ankles as often as possible Wound #5 Right,Distal Toe Great: Patient to wear own compression stockings Patient to wear own Velcro compression garment. Elevate legs to the level of the heart and pump ankles as often as possible Off-Loading: Wound #2 Left,Lateral Malleolus: Turn and reposition every 2 hours Other: - do not put pressure on your ankles Wound #3 Left Calcaneus: Turn and reposition every 2 hours Other: - do not put pressure on your ankles Wound #4 Right,Distal Toe Second: Turn and reposition every 2 hours Other: - do not put pressure on your ankles Wound #5 Right,Distal Toe Great: Turn and reposition every 2 hours Other: - do not put pressure on your ankles Additional Orders / Instructions: Wound #2 Left,Lateral Malleolus: Increase protein intake. Increase protein intake. Wound #3 Left Calcaneus: Increase protein intake. Increase protein intake. Wound #4 Right,Distal Toe Second: Increase protein intake. Increase protein intake. Wound #5 Right,Distal Toe Great: Increase protein intake. Increase protein intake. Home Health: Wound #3 Left Calcaneus: Continue Home Health Visits - [facility]. Please visit patient Monday, Wednesday, and Friday Home Health Nurse may visit PRN to address patient s wound care needs. FACE TO FACE ENCOUNTER: MEDICARE and MEDICAID PATIENTS: I certify that this patient is under my care and that I had a face-to-face encounter that  meets the  physician face-to-face encounter requirements with this patient on this date. The encounter with the patient was in whole or in part for the following MEDICAL CONDITION: (primary reason for [place]) MEDICAL NECESSITY: I certify, that based on my findings, NURSING services are a medically necessary home health service. HOME BOUND STATUS: I certify that my clinical findings support that this patient is homebound (i.e., Due to illness or injury, pt requires aid of supportive devices such as crutches, cane, wheelchairs, walkers, the use of special transportation or the assistance of another person to leave their place of residence. There is a normal inability to leave the home and doing so requires considerable and taxing effort. Other absences are for medical reasons / religious services and are infrequent or of short duration when for other reasons). If current dressing causes regression in wound condition, may D/C ordered dressing product/s and apply Normal Saline Moist Dressing daily until next [place] / Other MD appointment. [place] of regression in wound condition at [phone]. LASHUNDRA, SHIVELEY ([id number]) Please direct any NON-WOUND related issues/requests for orders to patient's Primary Care Physician Wound #4 Right,Distal Toe Second: [place] Visits - [facility]. Please visit patient Monday, Wednesday, and Friday Home Health Nurse may visit PRN to address patient s wound care needs. FACE TO FACE ENCOUNTER: MEDICARE and MEDICAID PATIENTS: I certify that this patient is under my care and that I had a face-to-face encounter that meets the physician face-to-face encounter requirements with this patient on this date. The encounter with the patient was in whole or in part for the following MEDICAL CONDITION: (primary reason for [place]) MEDICAL NECESSITY: I certify, that based on my findings, NURSING services are a medically necessary  home health service. HOME BOUND STATUS: I certify that my clinical findings support that this patient is homebound (i.e., Due to illness or injury, pt requires aid of supportive devices such as crutches, cane, wheelchairs, walkers, the use of special transportation or the assistance of another person to leave their place of residence. There is a normal inability to leave the home and doing so requires considerable and taxing effort. Other absences are for medical reasons / religious services and are infrequent or of short duration when for other reasons). If current dressing causes regression in wound condition, may D/C ordered dressing product/s and apply Normal Saline Moist Dressing daily until next [place] / Other MD appointment. [place] of regression in wound condition at [phone]. Please direct any NON-WOUND related issues/requests for orders to patient's Primary Care Physician Wound #5 Right,Distal Toe Great: [facility]. Please visit patient Monday, Wednesday, and Friday Home Health Nurse may visit PRN to address patient s wound care needs. FACE TO FACE ENCOUNTER: MEDICARE and MEDICAID PATIENTS: I certify that this patient is under my care and that I had a face-to-face encounter that meets the physician face-to-face encounter requirements with this patient on this date. The encounter with the patient was in whole or in part for the following MEDICAL CONDITION: (primary reason for [place]) MEDICAL NECESSITY: I certify, that based on my findings, NURSING services are a medically necessary home health service. HOME BOUND STATUS: I certify that my clinical findings support that this patient is homebound (i.e., Due to illness or injury, pt requires aid of supportive devices such as crutches, cane, wheelchairs, walkers, the use of special transportation or the assistance of another person to leave their place of  residence.  There is a normal inability to leave the home and doing so requires considerable and taxing effort. Other absences are for medical reasons / religious services and are infrequent or of short duration when for other reasons). If current dressing causes regression in wound condition, may D/C ordered dressing product/s and apply Normal Saline Moist Dressing daily until next [place] / Other MD appointment. [place] of regression in wound condition at [phone]. Please direct any NON-WOUND related issues/requests for orders to patient's Primary Care Physician The following medication(s) was prescribed: mupirocin topical 2 % ointment ointment topical applied in a thin film to the affected wound with each dressing change as directed starting 12/23/2018 I'm in a recommend that we see her for her normal follow-up this coming Friday to see were things stand. The patient is in agreement the plan. If anything changes she will let me know. Please see above for specific wound care orders. We will see patient for re-evaluation in 1 week(s) here in the clinic. If anything worsens or changes patient will contact our office for additional recommendations. Electronic Signature(s) Signed: 12/26/2018 9:23:56 AM By: Worthy Keeler PA-C Entered By: Worthy Keeler on 12/26/2018 09:00:09 Giammona, [name] ([id number]) -------------------------------------------------------------------------------- ROS/PFSH Details Patient Name: [name] Date of Service: 12/23/2018 3:15 PM Medical Record Number: [id number] Patient Account Number: [id number] Date of Birth/Sex: 03-24-1923 (85 y.o. F) Treating RN: Harold Barban Primary Care Provider: Grayland Ormond Other Clinician: Referring Provider: Grayland Ormond Treating Provider/Extender: Melburn Hake, HOYT Weeks in Treatment: 4 Information Obtained From Patient Wound History Do you currently have one or more open woundso Yes How  many open wounds do you currently haveo 1 Approximately how long have you had your woundso 2 yrs How have you been treating your wound(s) until nowo mupirocin, soaking in epsom salt Has your wound(s) ever healed and then re-openedo No Have you had any lab work done in the past montho No Have you tested positive for an antibiotic resistant organism (MRSA, VRE)o No Have you tested positive for osteomyelitis (bone infection)o No Have you had any tests for circulation on your legso Yes Who ordered the testo Dr. Lucky Cowboy [facility] Constitutional Symptoms (General Health) Complaints and Symptoms: Negative for: Fever; Chills Eyes Medical History: Positive for: Cataracts - surgery Respiratory Complaints and Symptoms: No Complaints or Symptoms Cardiovascular Complaints and Symptoms: No Complaints or Symptoms Medical History: Positive for: Congestive Heart Failure; Hypertension Musculoskeletal Medical History: Positive for: Osteoarthritis Neurologic Medical History: Positive for: Neuropathy ANALYCIA, KHOKHAR ([id number]) Oncologic Medical History: Negative for: Received Chemotherapy; Received Radiation Psychiatric Complaints and Symptoms: No Complaints or Symptoms HBO Extended History Items Eyes: Cataracts Immunizations Pneumococcal Vaccine: Received Pneumococcal Vaccination: Yes Implantable Devices Family and Social History Cancer: Yes - Father,Siblings; Diabetes: No; Heart Disease: Yes - Siblings; Hereditary Spherocytosis: No; Hypertension: No; Kidney Disease: No; Lung Disease: No; Seizures: No; Stroke: No; Thyroid Problems: No; Tuberculosis: No; Never smoker; Marital Status - Married; Alcohol Use: Never; Drug Use: No History; Caffeine Use: Rarely; Financial Concerns: No; Food, Clothing or Shelter Needs: No; Support System Lacking: No; Transportation Concerns: No; Advanced Directives: No; Patient does not want information on Advanced Directives; Do not  resuscitate: No; Living Will: Yes (Not Provided); Medical Power of Attorney: No Physician Affirmation I have reviewed and agree with the above information. Electronic Signature(s) Signed: 12/26/2018 9:23:56 AM By: Worthy Keeler PA-C Signed: 02/10/2019 10:53:34 AM By: Harold Barban Entered By: Worthy Keeler on  12/26/2018 08:59:31 Reede, [name] ([id number]) -------------------------------------------------------------------------------- SuperBill Details Patient Name: [name] Date of Service: 12/23/2018 Medical Record Number: [id number] Patient Account Number: [id number] Date of Birth/Sex: 1923/06/12 (85 y.o. 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 I70.243 Atherosclerosis of native arteries of left leg with ulceration of ankle I89.0 Lymphedema, not elsewhere classified B35.4 Tinea corporis 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

## 2019-02-10 NOTE — Progress Notes (Signed)
[name], [name] ([id number]) Visit Report for 02/07/2019 Chief Complaint Document Details Patient Name: [name], [name]. Date of Service: 02/07/2019 10:15 AM Medical Record Number: [id number] Patient Account Number: [id number] Date of Birth/Sex: 11-07-1923 (85 y.o. F) Treating RN: Montey Hora Primary Care Provider: Grayland Ormond Other Clinician: Referring Provider: Grayland Ormond Treating Provider/Extender: Melburn Hake, HOYT Weeks in Treatment: 61 Information Obtained from: Patient Chief Complaint Patient is here for right lateral malleolus, right 2nd distal toe and left lateral malleolus ulcer Electronic Signature(s) Signed: 02/08/2019 6:13:30 AM By: Worthy Keeler PA-C Entered By: Worthy Keeler on 02/07/2019 10:33:10 [name], [name] ([id number]) -------------------------------------------------------------------------------- HPI Details Patient Name: [name] Date of Service: 02/07/2019 10:15 AM Medical Record Number: [id number] Patient Account Number: [id number] Date of Birth/Sex: [birth year] (85 y.o. 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 Dr. Amalia Hailey the podiatrist saw her on 07/20/2017 and sent her to the wound center for possible hyperbaric oxygen therapy. past medical history of peripheral vascular disease, varicose veins, status post appendectomy, basal cell carcinoma excision from the left leg, cholecystectomy, pacemaker placement, right lower extremity angiography done by Dr. dew in March 2017 with placement of a stent. there is also note of a successful ablation of the  right small saphenous vein done which was reviewed by ultrasound on 10/24/2016. the patient had a right small saphenous vein ablation done on 10/20/2016. The patient has never been a smoker. She has been seen by Dr. Corene Cornea dew the vascular surgeon who most recently saw her on 06/15/2017 for evaluation of ongoing problems with right leg swelling. She had a lower extremity arterial duplex examination done(02/13/17) which showed patent distal right superficial femoral artery stent and above-the-knee popliteal stent without evidence of restenosis. The ABI was more than 1.3 on the right and more than 1.3 on the left. This was consistent with noncompressible arteries due to medial calcification. The right great toe pressure and PPG waveforms are within normal limits and the left great toe pressure and PPG waveforms are decreased. he recommended she continue to wear her compression stockings and continue with elevation. She is scheduled to have a noninvasive arterial study in the near future 08/16/2017 -- had a lower extremity arterial duplex examination done which showed patent distal right superficial femoral artery stent and above-the-knee popliteal stent without evidence of restenosis. The ABI was more than 1.3 on the right and more than 1.3 on the left. This was consistent with noncompressible arteries due to medial calcification. The right great toe pressure and PPG waveforms are within normal limits and the left great toe pressure and PPG waveforms are decreased. the x-ray of the right ankle has not yet been done 08/24/2017 -- had a right ankle x-ray -- IMPRESSION:1. No fracture, bone lesion or evidence of osteomyelitis. 2. Lateral soft tissue swelling with a soft tissue ulcer. she has not yet seen the vascular surgeon for review 08/31/17 on evaluation today patient's wound appears to be showing signs of improvement. She still with her appointment with vascular in order to review her results of her  vascular study and then determine if any intervention would be recommended at that time. No fevers, chills, nausea, or vomiting noted at  this time. She has been tolerating the dressing changes without complication. 09/28/17 on evaluation today patient's wound appears to show signs of good improvement in regard to the granulation tissue which is surfacing. There is still a layer of slough covering the wound and the posterior portion is still significantly deeper than the anterior nonetheless there has been some good sign of things moving towards the better. She is going to go back to Dr. dew for reevaluation to ensure her blood flow is still appropriate. That will be before her next evaluation with [place] next week. No fevers, chills, nausea, or vomiting noted at this time. Patient does have some discomfort rated to be a 3-4/10 depending on activity specifically cleansing the wound makes it worse. 10/05/2017 -- the patient was seen by Dr. Lucky Cowboy last week and noninvasive studies showed a normal right ABI with brisk triphasic waveforms consistent with no arterial insufficiency including normal digital pressures. The duplex showed a patent distal right SFA stent and the proximal SFA was also normal. He was pleased with her test and thought she should have enough of perfusion for normal wound healing. He would see her back in 6 months time. 12/21/17 on evaluation today patient appears to be doing fairly well in regard to her right lateral ankle wound. Unfortunately the main issue that she is expansion at this point is that she is having some issues with what appears to be some cellulitis in the MATHA, MASSE. ([id number]) right anterior shin. She has also been noting a little bit of uncomfortable feeling especially last night and her ankle area. I'm afraid that she made the developing a little bit of an infection. With that being said I think it is in the early stages. 12/28/17 on evaluation today patient's  ankle appears to be doing excellent. She's making good progress at this point the cellulitis seems to have improved after last week's evaluation. Overall she is having no significant discomfort which is excellent news. She does have an appointment with Dr. dew on March 29, 2018 for reevaluation in regard to the stent he placed. She seems to have excellent blood flow in the right lower extremity. 01/19/12 on evaluation today patient's wound appears to be doing very well. In fact she does not appear to require debridement at this point, there's no evidence of infection, and overall from the standpoint of the wound she seems to be doing very well. With that being said I believe that it may be time to switch to different dressing away from the [facility] Dressing she tells me she does have a lot going on her friend actually passed away yesterday and she's also having a lot of issues with her husband this obviously is weighing heavy on her as far as your thoughts and concerns today. 01/25/18 on evaluation today patient appears to be doing fairly well in regard to her right lateral malleolus. She has been tolerating the dressing changes without complication. Overall I feel like this is definitely showing signs of improvement as far as how the overall appearance of the wound is there's also evidence of epithelium start to migrate over the granulation tissue. In general I think that she is progressing nicely as far as the wound is concerned. The only concern she really has is whether or not we can switch to every other week visits in order to avoid having as many appointments as her daughters have a difficult time getting her to her appointments as well as the patient's husband  to his he is not doing very well at this point. 02/22/18 on evaluation today patient's right lateral malleolus ulcer appears to be doing great. She has been tolerating the dressing changes without complication. Overall you making  excellent progress at this time. Patient is having no significant discomfort. 03/15/18 on evaluation today patient appears to be doing much more poorly in regard to her right lateral ankle ulcer at this point. Unfortunately since have last seen her her husband has passed just a few days ago is obviously weighed heavily on her her daughter also had surgery well she is with her today as usual. There does not appear to be any evidence of infection she does seem to have significant contusion/deep tissue injury to the right lateral malleolus which was not noted previous when I saw her last. It's hard to tell of exactly when this injury occurred although during the time she was spending the night in the hospital this may have been most likely. 03/22/18 on evaluation today patient appears to actually be doing very well in regard to her ulcer. She did unfortunately have a setback which was noted last week however the good news is we seem to be getting back on track and in fact the wound in the core did still have some necrotic tissue which will be addressed at this point today but in general I'm seeing signs that things are on the up and up. She is glad to hear this obviously she's been somewhat concerned that due to the how her wound digressed more recently. 03/29/18 on evaluation today patient appears to be doing fairly well in regard to her right lower extremity lateral malleolus ulcer. She unfortunately does have a new area of pressure injury over the inferior portion where the wound has opened up a little bit larger secondary to the pressure she seems to be getting. She does tell me sometimes when she sleeps at night that it actually hurts and does seem to be pushing on the area little bit more unfortunately. There does not appear to be any evidence of infection which is good news. She has been tolerating the dressing changes without complication. She also did have some bruising in the left second and  third toes due to the fact that she may have bump this or injured it although she has neuropathy so she does not feel she did move recently that may have been where this came from. Nonetheless there does not appear to be any evidence of infection at this time. 04/12/18 on evaluation today patient's wound on the right lateral ankle actually appears to be doing a little bit better with a lot of necrotic docking tissue centrally loosening up in clearing away. However she does have the beginnings of a deep tissue injury on the left lateral malleolus likely due to the fact we've been trying offload the right as much as we have. I think she may benefit from an assistive soft device to help with offloading and it looks like they're looking at one of the doughnut conditions that wraps around the lower leg to offload which I think will definitely do a good job. With that being said I think we definitely need to address this issue on the left before it becomes a wound. Patient is not having significant pain. 04/19/18 on evaluation today patient appears to be doing excellent in regard to the progress she's made with her right lateral ankle ulcer. The left ankle region which did show evidence of a  deep tissue injury seems to be resolving there's little fluid noted underneath and a blister there's nothing open at this point in time overall I feel like this is progressing nicely which is good news. She does not seem to be having significant discomfort at this point which is also good news. 04/25/18-She is here in follow up evaluation for bilateral lateral malleolar ulcers. The right lateral malleolus ulcer with pale subcutaneous tissue exposure, central area of ulcer with tendon/periosteum exposed. The left lateral malleolus ulcer now with Deam, [name] ([id number]) central area of nonviable tissue, otherwise deep tissue injury. She is wearing compression wraps to the left lower extremity, she will place the  right lower extremity compression wraps on when she gets home. She will be out of town over the weekend and return next week and follow-up appointment. She completed her doxycycline this morning 05/03/18 on evaluation today patient appears to be doing very well in regard to her right lateral ankle ulcer in general. At least she's showing some signs of improvement in this regard. Unfortunately she has some additional injury to the left lateral malleolus region which appears to be new likely even over the past several days. Again this determination is based on the overall appearance. With that being said the patient is obviously frustrated about this currently. 05/10/18-She is here in follow-up evaluation for bilateral lateral malleolar ulcers. She states she has purchased offloading shoes/boots and they will arrive tomorrow. She was asked to bring them in the office at next week's appointment so her provider is aware of product being utilized. She continues to sleep on right or left side, she has been encouraged to sleep on her back. The right lateral malleolus ulcer is precariously close to peri-osteum; will order xray. The left lateral malleolus ulcer is improved. Will switch back to santyl; she will follow up next week. 05/17/18 on evaluation today patient actually appears to be doing very well in regard to her malleolus her ulcers compared to last time I saw them. She does not seem to have as much in the way of contusion at this point which is great news. With that being said she does continue to have discomfort and I do believe that she is still continuing to benefit from the offloading/pressure reducing boots that were recommended. I think this is the key to trying to get this to heal up completely. 05/24/18 on evaluation today patient actually appears to be doing worse at this point in time unfortunately compared to her last week's evaluation. She is having really no increased pain which is good  news unfortunately she does have more maceration in your theme and noted surrounding the right lateral ankle the left lateral ankle is not really is erythematous I do not see signs of the overt cellulitis on that side. Unfortunately the wounds do not seem to have shown any signs of improvement since the last evaluation. She also has significant swelling especially on the right compared to previous some of this may be due to infection however also think that she may be served better while she has these wounds by compression wrapping versus continuing to use the Juxta-Lite for the time being. Especially with the amount of drainage that she is experiencing at this point. No fevers, chills, nausea, or vomiting noted at this time. 05/31/18 on evaluation today patient appears to actually be doing better in regard to her right lateral lower extremity ulcer specifically on the malleolus region. She has been tolerating the antibiotic without  complication. With that being said she still continues to have issues but a little bit of redness although nothing like she what she was experiencing previous. She still continues to pressure to her ankle area she did get the problem on offloading boots unfortunately she will not wear them she states there too uncomfortable and she can't get in and out of the bed. Nonetheless at this point her wounds seem to be continually getting worse which is not what we want I'm getting somewhat concerned about her progress and how things are going to proceed if we do not intervene in some way shape or form. I therefore had a very lengthy conversation today about offloading yet again and even made a specific suggestion for switching her to a memory foam mattress and even gave the information for a specific one that they could look at getting if it was something that they were interested in considering. She does not want to be considered for a hospital bed air mattress although honestly  insurance would not cover it that she does not have any wounds on her trunk. 06/14/18 on evaluation today both wounds over the bilateral lateral malleolus her ulcers appear to be doing better there's no evidence of pressure injury at this point. She did get the foam mattress for her bed and this does seem to have been extremely beneficial for her in my pinion. Her daughter states that she is having difficulty getting out of bed because of how soft it is. The patient also relates this to be. Nonetheless I do feel like she's actually doing better. Unfortunately right after and around the time she was getting the mattress she also sustained a fall when she got up to go pick up the phone and ended up injuring her right elbow she has 18 sutures in place. We are not caring for this currently although home health is going to be taking the sutures out shortly. Nonetheless this may be something that we need to evaluate going forward. It depends on how well it has or has not healed in the end. She also recently saw an orthopedic specialist for an injection in the right shoulder just before her fall unfortunately the fall seems to have worsened her pain. 06/21/18 on evaluation today patient appears to be doing about the same in regard to her lateral malleolus ulcers. Both appear to be just a little bit deeper but again we are clinging away the necrotic and dead tissue which I think is why this is progressing towards a deeper realm as opposed to improving from my measurement standpoint in that regard. Nonetheless she has been tolerating the dressing changes she absolutely hates the memory foam mattress topper that was obtained for her nonetheless I do believe this is still doing excellent as far as taking care of excess pressure in regard to the lateral malleolus regions. She in fact has no pressure injury that I see whereas in weeks past it was week by week I was constantly seeing new pressure injuries. Overall  I think it has been very beneficial for her. 07/03/18; patient arrives in my clinic today. She has deep punched out areas over her bilateral lateral malleoli. The area on the right has some more depth. [name], [name] ([id number]) We spent a lot of time today talking about pressure relief for these areas. This started when her daughter asked for a prescription for a memory foam mattress. I have never written a prescription for a mattress and I don't  think insurances would pay for that on an ordinary bed. In any case he came up that she has foam boots that she refuses to wear. I would suggest going to these before any other offloading issues when she is in bed. They say she is meticulous about offloading this the rest of the day 07/10/18- She is seen in follow-up evaluation for bilateral, lateral malleolus ulcers. There is no improvement in the ulcers. She has purchased and is sleeping on a memory foam mattress/overlay, she has been using the offloading boots nightly over the past week. She has a follow up appointment with vascular medicine at the end of October, in my opinion this follow up should be expedited given her deterioration and suboptimal TBI results. We will order plain film xray of the left ankle as deeper structures are palpable; would consider having MRI, regardless of xray report(s). The ulcers will be treated with iodoflex/iodosorb, she is unable to safely change the dressings daily with santyl. 07/19/18 on evaluation today patient appears to be doing in general visually well in regard to her bilateral lateral malleolus ulcers. She has been tolerating the dressing changes without complication which is good news. With that being said we did have an x-ray performed on 07/12/18 which revealed a slight loosen see in the lateral portion of the distal left fibula which may represent artifact but underline lytic destruction or osteomyelitis could not be excluded. MRI was recommended. With  that being said we can see about getting the patient scheduled for an MRI to further evaluate this area. In fact we have that scheduled currently for August 20 19,019. 07/26/18 on evaluation today patient's wound on the right lateral ankle actually appears to be doing fairly well at this point in my pinion. She has made some good progress currently. With that being said unfortunately in regard to the left lateral ankle ulcer this seems to be a little bit more problematic at this time. In fact as I further evaluated the situation she actually had bone exposed which is the first time that's been the case in the bone appear to be necrotic. Currently I did review patient's note from Dr. Bunnie Domino office with [facility]. He stated that ABI was 1.26 on the right and 0.95 on the left with good waveforms. Her perfusion is stable not reduced from previous studies and her digital waveforms were pretty good particularly on the right. His conclusion upon review of the note was that there was not much she could do to improve her perfusion and he felt she was adequate for wound healing. His suggestion was that she continued to see [place] and consider a synthetic skin graft if there was no underlying infection. He plans to see her back in six months or as needed. 08/01/18 on evaluation today patient appears to be doing better in regard to her right lateral ankle ulcer. Her left lateral ankle ulcer is about the same she still has bone involvement in evidence of necrosis. There does not appear to be evidence of infection at this time On the right lateral lower extremity. I have started her on the Augmentin she picked this up and started this yesterday. This is to get her through until she sees infectious disease which is scheduled for 08/12/18. 08/06/18 on evaluation today patient appears to be doing rather well considering my discussion with patient's daughter at the end of last week. The area which was  marked where she had erythema seems to be improved  and this is good news. With that being said overall the patient seems to be making good improvement when it comes to the overall appearance of the right lateral ankle ulcer although this has been slow she at least is coming around in this regard. Unfortunately in regard to the left lateral ankle ulcer this is osteomyelitis based on the pathology report as well is bone culture. Nonetheless we are still waiting CT scan. Unfortunately the MRI we originally ordered cannot be performed as the patient is a pacemaker which I had overlooked. Nonetheless we are working on the CT scan approval and scheduling as of now. She did go to the hospital over the weekend and was placed on IV Cefzo for a couple of days. Fortunately this seems to have improved the erythema quite significantly which is good news. There does not appear to be any evidence of worsening infection at this time. She did have some bleeding after the last debridement therefore I did not perform any sharp debridement in regard to left lateral ankle at this point. Patient has been approved for a snap vac for the right lateral ankle. 08/14/18; the patient with wounds over her bilateral lateral malleoli. The area on the right actually looks quite good. Been using a snap back on this area. Healthy granulation and appears to be filling in. Unfortunately the area on the left is really problematic. 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 Also noted to have cellulitis. She saw Dr. Novella Olive of infectious disease today and was put on linezolid. We are able to verify this with her pharmacy. She is completed the Augmentin that she was already on. We've been using Iodoflex to this area 08/23/18 on evaluation today patient's wounds both actually appear to be doing better compared to my prior evaluations. Fortunately she showing  signs of good improvement in regard to the overall wound status especially where were using the snap vac on the right. In regard to left lateral malleolus the wound bed actually appears to be much cleaner than previously noted. I do not feel any phone directly probed during evaluation today and though there is tendon noted this does not appear Bahner, [name] ([id number]) to be necrotic it's actually fairly good as far as the overall appearance of the tendon is concerned. In general the wound bed actually appears to be doing significantly better than it was previous. Patient is currently in the care of [name] and I did review that note today. He actually has her on two weeks of linezolid and then following the patient will be on 1-2 months of Keflex. That is the plan currently. She has been on antibiotics therapy as prescribed by myself initially starting on July 30, 2018 and has been on that continuously up to this point. 08/30/18 on evaluation today patient actually appears to be doing much better in regard to her right lateral malleolus ulcer. She has been tolerating the dressing changes specifically the snap vac without complication although she did have some issues with the seal currently. Apparently there was some trouble with getting it to maintain over the past week past Sunday. Nonetheless overall the wound appears better in regard to the right lateral malleolus region. In regard to left lateral malleolus this actually show some signs of additional granulation although there still tendon noted in the base of the wound this appears to be healthy not necrotic in any way whatsoever.  We are considering potentially using a snap vac for the left lateral malleolus as well the product wrap from [employer], [place], was present in the clinic today we're going to see this patient I did have her come in with me after obtaining consent from the patient and her daughter in order to look at the wound and  see if there's any recommendation one way or another as to whether or not they felt the snapback could be beneficial for the left lateral malleolus region. But the conclusion was that it might be but that this is definitely a little bit deeper wound than what traditionally would be utilized for a snap vac. 09/06/18 on evaluation today patient actually appears to be doing excellent in my pinion in regard to both ankle ulcers. She has been tolerating the dressing changes without complication which is great news. Specifically we have been using the snap vac. In regard to the right ankle I'm not even sure that this is going to be necessary for today and following as the wound has filled in quite nicely. In regard to the left ankle I do believe that we're seeing excellent epithelialization from the edge as well as granulation in the central portion the tendon is still exposed but there's no evidence of necrotic bone and in general I feel like the patient has made excellent progress even compared to last week with just one week of the snap vac. 09/11/18; this is a patient who has wounds on her bilateral lateral malleoli. Initially both of these were deep stage IV wounds in the setting of chronic arterial insufficiency. She has been revascularized. As I understand think she been using snap vacs to both of these wounds however the area on the right became more superficial and currently she is only using it on the left. Using silver collagen on the right and silver collagen under the back on the left I believe 09/19/18 on evaluation today patient actually appears to be doing very well in regard to her lateral malleolus or ulcers bilaterally. She has been tolerating the dressing changes without complication. Fortunately there does not appear to be any evidence of infection at this time. Overall I feel like she is improving in an excellent manner and I'm very pleased with the fact that everything seems to be  turning towards the better for her. This has obviously been a long road. 09/27/18 on evaluation today patient actually appears to be doing very well in regard to her bilateral lateral malleolus ulcers. She has been tolerating the dressing changes without complication. Fortunately there does not appear to be any evidence of infection at this time which is also great news. No fevers, chills, nausea, or vomiting noted at this time. Overall I feel like she is doing excellent with the snap vac on the left malleolus. She had 40 mL of fluid collection over the past week. 10/04/18 on evaluation today patient actually appears to be doing well in regard to her bilateral lateral malleolus ulcers. She continues to tolerate the dressing changes without complication. One issue that I see is the snap vac on the left lateral malleolus which appears to have sealed off some fluid underlying this area and has not really allowed it to heal to the degree that I would like to see. For that reason I did suggest at this point we may want to pack a small piece of packing strip into this region to allow it to more effectively wick out fluid. 10/11/18 in general  the patient today does not feel that she has been doing very well. She's been a little bit lethargic and subsequently is having bodyaches as well according to what she tells me today. With that being said overall she has been concerned with the fact that something may be worsening although to be honest her wounds really have not been appearing poorly. She does have a new ulcer on her left heel unfortunately. This may be pressure related. Nonetheless it seems to me to have potentially started at least as a blister I do not see any evidence of deep tissue injury. In regard to the left ankle the snap vac still seems to be causing the ceiling off of the deeper part of the wound which is in turn trapping fluid. I'm not extremely pleased with the overall appearance as far as  progress from last week to this week therefore I'm gonna discontinue the snap vac at this point. 10/18/18 patient unfortunately this point has not been feeling well for the past several days. She was seen by Grayland Ormond her primary care provider who is a Librarian, academic at [name]. Subsequently she states that she's been very weak and generally feeling malaise. No fevers, chills, nausea, or vomiting noted at this time. With that being said bloodwork was performed at the PCP office on the 11th of this month which showed a white blood cell count of 10.7. This was repeated today [name], [name] ([id number]) and shows a white blood cell count of 12.4. This does show signs of worsening. Coupled with the fact that she is feeling worse and that her left ankle wound is not really showing signs of improvement I feel like this is an indication that the osteomyelitis is likely exacerbating not improving. Overall I think we may also want to check her C-reactive protein and sedimentation rate. Actually did call [facility] office this afternoon while the patient was in the office here with me. Subsequently based on the findings we discussed treatment possibilities and I think that it is appropriate for [place] to go ahead and initiate treatment with doxycycline which I'm going to do. Subsequently he did agree to see about adding a CRP and sedimentation rate to her orders. If that has not already been drawn to where they can run it they will contact the patient she can come back to have that check. They are in agreement with plan as far as the patient and her daughter are concerned. Nonetheless also think we need to get in touch with Dr. Henreitta Leber office to see about getting the patient scheduled with him as soon as possible. 11/08/18 on evaluation today patient presents for follow-up concerning her bilateral foot and ankle ulcers. I did do an extensive review of her chart in epic today.  Subsequently she was seen by [name] he did initiate Cefepime IV antibiotic therapy. Subsequently she had some issues with her PICC line this had to be removed because it was coiled and then replaced. Fortunately that was now settled. Unfortunately she has continued have issues with her left heel as well as the issues that she is experiencing with her bilateral lateral malleolus regions. I do believe however both areas seem to be doing a little bit better on evaluation today which is good news. No fevers, chills, nausea, or vomiting noted at this time. She actually has an angiogram schedule with Dr. dew on this coming Monday, November 11, 2018. Subsequently the patient states that she is feeling much better  especially than what she was roughly 2 weeks ago. She actually had to cancel an appointment because she was feeling so poorly. No fevers, chills, nausea, or vomiting noted at this time. 11/15/18 on evaluation today patient actually is status post having had her angiogram with Dr. dew Monday, four days ago. 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 He had to go and work on several areas of the vasculature fortunately he was able to obtain no more than a 30% residual stenosis throughout post procedure. I reviewed this note today. I think this will definitely help with healing at this time. Fortunately there does not appear to be any signs of infection and I do feel like ratio already has a better appearance to it. 11/22/18 upon evaluation today patient actually appears to be doing very well in regard to her wounds in general. The right lateral malleolus looks excellent the heel looks better in the left lateral malleolus also appears to be doing a little better. With that being said the right second toe actually appears to be open and training we been watching this is been dry and stable but now is open. 12/03/2018 Seen today for follow-up and management of multiple  bilateral lower extremity wounds. New pressure injury of the great toe which is closed at this time. Wound of the right distal second toe appears larger today with deep undermining and a pocket of fluid present within the undermining region. Left and right malleolus is wounds are stable today with no signs and symptoms of infection.Denies any needs or concerns during exam today. 12/13/18 on evaluation today patient appears to be doing somewhat better in regard to her left heel ulcer. She also seems to be completely healed in regard to the right lateral malleolus ulcer. The left malleolus ulcer is smaller what unfortunately the wounds which are new over the first and second toes of the right foot are what are most concerning at this point especially the second. Both areas did require sharp debridement today. 12/20/18 on evaluation today patient's wound actually appears to be doing better in regard to left lateral ankle and her right lateral ankle continues to remain healed. The hill ulcer on the left is improved. She does have improvement noted as well in regard to both toe ulcers. Overall I'm very pleased in this regard. No fevers, chills, nausea, or vomiting noted at this time. 12/23/18 on evaluation today patient is seen after she had her toenails trimmed at the podiatrist office due to issues with her right great toe. There was what appeared to be dark eschar on the surface of the wound which had her in the podiatrist concerned. Nonetheless as I remember that during the last office visit I had utilize silver nitrate of this area I was much less concerned about the situation. Subsequently I was able to clean off much of this tissue without any complication today. This does not appear to show any signs of infection and actually look somewhat better compared to last time post debridement. Her second toe on the right foot actually had callous over and there did appear still be some fluid underneath this  that would require debridement today. 12/27/18 on evaluation today patient actually appears to be showing signs of improvement at all locations. Even the left lateral ankle although this is not quite as great as the other sites. Fortunately there does not appear to be any signs of infection at this time and both of  her toes on the right foot seem to be showing signs of improvement which is good news and very pleased in this regard. 01/03/19 on evaluation today patient appears to be doing better for the most part in regard to her wounds in particular. There Mcclafferty, [name] ([id number]) does not appear to be any evidence of infection at this time which is good news. Fortunately there is no sign of really worsening anywhere except for the right great toe which she does have what appears to be a bruise/deep tissue injury which is very superficial and already resolving. I'm not sure where this came from I questioned her extensively and she does not recall what may have happened with this. Other than that the patient seems to be doing well even the left lateral ankle ulcer looks good and is getting smaller. 01/10/19 on evaluation today patient appears to be doing well in regard to her left heel wound and both of her toe wounds. Overall I feel like there is definitely improvement here and I'm happy in that regard. With that being said unfortunately she is having issues with the left lateral malleolus ulcer which unfortunately still has a lot of depth to it. This is gonna be a very difficult wound for [place] to be able to truly get to heal. I may want to consider some type of skin substitute to see if this would be of benefit for her. I'll discuss this with her more the next visit most likely. This was something I thought about more at the end of the visit when I was Artie out of the room and the patient had been discharged. 01/17/19 on evaluation today patient appears to be doing very well in regard to her  wounds in general. She's been making excellent progress at this time. Fortunately there's no sign of infection at this time either. No fevers, chills, nausea, or vomiting noted at this time. The biggest issue is still her left lateral malleolus where it appears to be doing well and is getting smaller but still shows a small corner where this is deeper and goes down into what appears to be the joint space. Nonetheless this is taking much longer to heal although it still looks better in smaller than previous evaluations. 01/24/19 on evaluation today patient's wounds actually appear to be doing rather well in general overall. She did require some sharp debridement in regard to the right great toe but everything else appears to be doing excellent no debridement was even necessary. No fevers, chills, nausea, or vomiting noted at this time. 01/31/19 on evaluation today patient actually appears to be doing much better in regard to her left foot wound on the heel as well as the ankle. The right great toe appears to be a little bit worse today this had callous over and trapped a lot of fluid underneath. Fortunately there's no signs of infection at any site which is great news. 02/07/19 on evaluation today patient actually appears to be doing decently well in regard to all of her ulcers at this point. No sharp debridement was required she is a little bit of hyper granulation in regard to the left lateral ankle as well as the left heel but the hill itself is almost completely healed which is excellent news. Overall been very pleased in this regard. Electronic Signature(s) Signed: 02/08/2019 6:13:30 AM By: Worthy Keeler PA-C Entered By: Worthy Keeler on 02/08/2019 05:10:29 [name], [name] ([id number]) -------------------------------------------------------------------------------- [facility] TISS Details Patient  Name: MAKENZYE, TROUTMAN. Date of Service: 02/07/2019 10:15 AM Medical Record Number:  [id number] Patient Account Number: [id number] Date of Birth/Sex: [birth year] (85 y.o. F) Treating RN: Montey Hora Primary Care Provider: Grayland Ormond Other Clinician: Referring Provider: Grayland Ormond Treating Provider/Extender: Melburn Hake, HOYT Weeks in Treatment: 78 Procedure Performed for: Wound #3 Left Calcaneus Performed By: Physician STONE III, HOYT E., PA-C Post Procedure Diagnosis Same as Pre-procedure Notes 1 silver nitrate stick used Electronic Signature(s) Signed: 02/07/2019 4:27:08 PM By: Montey Hora Entered By: Montey Hora on 02/07/2019 10:42:17 Levins, [name] ([id number]) -------------------------------------------------------------------------------- [facility] TISS Details Patient Name: [name] Date of Service: 02/07/2019 10:15 AM Medical Record Number: [id number] Patient Account Number: [id number] Date of Birth/Sex: 02-Sep-1923 (85 y.o. F) Treating RN: Montey Hora Primary Care Provider: Grayland Ormond Other Clinician: Referring Provider: Grayland Ormond Treating Provider/Extender: Melburn Hake, HOYT Weeks in Treatment: 78 Procedure Performed for: Wound #2 Left,Lateral Malleolus Performed By: Physician STONE III, HOYT E., PA-C Post Procedure Diagnosis Same as Pre-procedure Notes 1 silver nitrate stick used Electronic Signature(s) Signed: 02/07/2019 4:27:08 PM By: Montey Hora Entered By: Montey Hora on 02/07/2019 10:44:05 [name], [name] ([id number]) -------------------------------------------------------------------------------- Physical Exam Details Patient Name: [name] Date of Service: 02/07/2019 10:15 AM Medical Record Number: [id number] Patient Account Number: [id number] Date of Birth/Sex: [birth year] (85 y.o. F) Treating RN: Montey Hora Primary Care Provider: Grayland Ormond Other Clinician: Referring Provider: Grayland Ormond Treating Provider/Extender: STONE III, HOYT Weeks in Treatment:  67 Constitutional Well-nourished and well-hydrated in no acute distress. Respiratory normal breathing without difficulty. Psychiatric this patient is able to make decisions and demonstrates good insight into disease process. Alert and Oriented x 3. pleasant and cooperative. Notes Patient's wound bed currently shows evidence of good granulation at this time. In general she's been doing fairly well with the Current wound care measures I'm very pleased with this. Electronic Signature(s) Signed: 02/08/2019 6:13:30 AM By: Worthy Keeler PA-C Entered By: Worthy Keeler on 02/08/2019 05:11:11 [name], [name] ([id number]) -------------------------------------------------------------------------------- Physician Orders Details Patient Name: [name] Date of Service: 02/07/2019 10:15 AM Medical Record Number: [id number] Patient Account Number: [id number] Date of Birth/Sex: [birth year] (85 y.o. F) Treating RN: Montey Hora Primary Care Provider: Grayland Ormond Other Clinician: Referring Provider: Grayland Ormond Treating Provider/Extender: Melburn Hake, HOYT Weeks in Treatment: 73 Verbal / Phone Orders: No Diagnosis Coding ICD-10 Coding Code Description 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, stage 4 L89.524 Pressure ulcer of left ankle, stage 4 L89.623 Pressure ulcer of left heel, stage 3 L89.892 Pressure ulcer of other site, stage 2 I70.233 Atherosclerosis of native arteries of right leg with ulceration of ankle I70.243 Atherosclerosis of native arteries of left leg with ulceration of ankle I89.0 Lymphedema, not elsewhere classified B35.4 Tinea corporis 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 o Cleanse wound with mild soap and water o May Shower, gently pat wound dry prior to applying new dressing. Wound #3 Left Calcaneus o Clean wound with Normal Saline. o  Cleanse wound with mild soap and water o May Shower, gently pat wound dry prior to applying new dressing. Wound #5 Right,Distal Toe Great o Clean wound with Normal Saline. o Cleanse wound with mild soap and water o May Shower, gently pat wound dry prior to applying new dressing. Anesthetic (add to Medication List) Wound #2 Left,Lateral Malleolus o Topical Lidocaine 4% cream applied to wound bed prior to debridement (In Clinic Only).  Wound #3 Left Calcaneus o Topical Lidocaine 4% cream applied to wound bed prior to debridement (In Clinic Only). Wound #5 Right,Distal Toe Great o Topical Lidocaine 4% cream applied to wound bed prior to debridement (In Clinic Only). Primary Wound Dressing Wound #2 Left,Lateral Malleolus o Silver Collagen - small piece of silver collagen to tunnel with iodoform packing over [name], [name] ([id number]) o Iodoform packing Gauze Wound #3 Left Calcaneus o Silver Collagen Wound #5 Right,Distal Toe Great o Silver Alginate Secondary Dressing Wound #2 Left,Lateral Malleolus o Boardered Foam Dressing Wound #3 Left Calcaneus o Boardered Foam Dressing Wound #5 Right,Distal Toe Great o Other - Coverlet or bandaid Dressing Change Frequency Wound #2 Left,Lateral Malleolus o Change Dressing Monday, Wednesday, Friday Wound #3 Left Calcaneus o Change Dressing Monday, Wednesday, Friday Wound #5 Right,Distal Toe Great o Change Dressing Monday, Wednesday, Friday Follow-up Appointments Wound #2 Left,Lateral Malleolus o Return Appointment in 1 week. Wound #3 Left Calcaneus o Return Appointment in 1 week. Wound #5 Right,Distal Toe Great o Return Appointment in 1 week. Edema Control Wound #2 Left,Lateral Malleolus o Patient to wear own compression stockings o Patient to wear own Velcro compression garment. o Elevate legs to the level of the heart and pump ankles as often as possible Wound #3 Left Calcaneus o  Patient to wear own compression stockings o Patient to wear own Velcro compression garment. o Elevate legs to the level of the heart and pump ankles as often as possible Off-Loading Wound #2 Left,Lateral Malleolus o Turn and reposition every 2 hours o Other: - do not put pressure on your ankles Wound #3 Left Calcaneus [name], [name] ([id number]) o Turn and reposition every 2 hours o Other: - do not put pressure on your ankles Wound #5 Right,Distal Toe Great o Turn and reposition every 2 hours o Other: - do not put pressure on your ankles Home Health Wound #2 [place] Visits - [facility]. Please visit patient Monday, Wednesday, and Friday o Home Health Nurse may visit [facility] to address patientos wound care needs. o FACE TO FACE ENCOUNTER: MEDICARE and MEDICAID PATIENTS: I certify that this patient is under my care and that I had a face-to-face encounter that meets the physician face-to-face encounter requirements with this patient on this date. The encounter with the patient was in whole or in part for the following MEDICAL CONDITION: (primary reason for [place]) MEDICAL NECESSITY: I certify, that based on my findings, NURSING services are a medically necessary home health service. HOME BOUND STATUS: I certify that my clinical findings support that this patient is homebound (i.e., Due to illness or injury, pt requires aid of supportive devices such as crutches, cane, wheelchairs, walkers, the use of special transportation or the assistance of another person to leave their place of residence. There is a normal inability to leave the home and doing so requires considerable and taxing effort. Other absences are for medical reasons / religious services and are infrequent or of short duration when for other reasons). o If current dressing causes regression in wound condition, may D/C ordered dressing product/s and apply Normal  Saline Moist Dressing daily until next [place] / Other MD appointment. [place] of regression in wound condition at [phone]. o Please direct any NON-WOUND related issues/requests for orders to patient's Primary Care Physician Wound #3 Left [place] Visits - [facility]. Please visit patient Monday, Wednesday, and Friday o Home Health Nurse may visit [facility]  to address patientos wound care needs. o FACE TO FACE ENCOUNTER: MEDICARE and MEDICAID PATIENTS: I certify that this patient is under my care and that I had a face-to-face encounter that meets the physician face-to-face encounter requirements with this patient on this date. The encounter with the patient was in whole or in part for the following MEDICAL CONDITION: (primary reason for [place]) MEDICAL NECESSITY: I certify, that based on my findings, NURSING services are a medically necessary home health service. HOME BOUND STATUS: I certify that my clinical findings support that this patient is homebound (i.e., Due to illness or injury, pt requires aid of supportive devices such as crutches, cane, wheelchairs, walkers, the use of special transportation or the assistance of another person to leave their place of residence. There is a normal inability to leave the home and doing so requires considerable and taxing effort. Other absences are for medical reasons / religious services and are infrequent or of short duration when for other reasons). o If current dressing causes regression in wound condition, may D/C ordered dressing product/s and apply Normal Saline Moist Dressing daily until next [place] / Other MD appointment. [place] of regression in wound condition at [phone]. o Please direct any NON-WOUND related issues/requests for orders to patient's Primary Care Physician Wound #5 Right,Distal Toe [place]  Visits - [facility]. Please visit patient Monday, Wednesday, and Friday o Home Health Nurse may visit PRN to address patientos wound care needs. o FACE TO FACE ENCOUNTER: MEDICARE and MEDICAID PATIENTS: I certify that this patient is under my care and that I had a face-to-face encounter that meets the physician face-to-face encounter requirements with this patient on this date. The encounter with the patient was in whole or in part for the following MEDICAL CONDITION: (primary reason for [place]) MEDICAL NECESSITY: I certify, that based on my findings, NURSING services are a medically necessary home health service. HOME BOUND STATUS: I certify that my clinical findings support that this patient is homebound (i.e., Due to illness or injury, pt requires aid of supportive devices such as crutches, cane, wheelchairs, walkers, the use of special transportation or the assistance of another person to leave their place of residence. There is a normal inability to leave the home and doing so requires considerable and taxing effort. Other absences are for medical reasons / religious services and are infrequent or of short duration when for other reasons). MEREDETH, FURBER ([id number]) o If current dressing causes regression in wound condition, may D/C ordered dressing product/s and apply Normal Saline Moist Dressing daily until next [place] / Other MD appointment. [place] of regression in wound condition at [phone]. o Please direct any NON-WOUND related issues/requests for orders to patient's Primary Care Physician Electronic Signature(s) Signed: 02/07/2019 4:27:08 PM By: Montey Hora Signed: 02/08/2019 6:13:30 AM By: Worthy Keeler PA-C Entered By: Montey Hora on 02/07/2019 10:47:56 [place], [name] ([id number]) -------------------------------------------------------------------------------- Problem List Details Patient Name: [name] Date of Service: 02/07/2019 10:15 AM Medical Record Number: [id number] Patient Account Number: [id number] Date of Birth/Sex: [birth year] (85 y.o. 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 08/10/2017 No Yes ankle I70.243 Atherosclerosis of native arteries of left leg with ulceration of 07/10/2018 No Yes ankle I89.0 Lymphedema, not elsewhere classified 08/10/2017 No Yes B35.4 Tinea corporis 09/28/2017 No Yes 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, left ankle and foot 08/14/2018 No Yes Inactive Problems Resolved Problems [name], [name] ([id number]) ICD-10 Code Description Active Date Resolved Date S90.822A Blister (nonthermal), left foot, initial encounter 10/11/2018 10/11/2018 Electronic Signature(s) Signed: 02/08/2019 6:13:30 AM By: Worthy Keeler PA-C Entered By: Worthy Keeler on 02/07/2019 10:33:04 [name], [name] ([id number]) -------------------------------------------------------------------------------- Progress Note Details Patient Name: [name] Date of Service: 02/07/2019 10:15 AM Medical Record Number: [id number] Patient Account Number: [id number] Date of Birth/Sex: 1923-08-31 (85 y.o. 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 Dr. Amalia Hailey the podiatrist saw her on 07/20/2017 and sent her to the wound center for possible hyperbaric oxygen therapy. past medical history of peripheral vascular disease, varicose veins, status post appendectomy, basal cell carcinoma excision from the left leg, cholecystectomy, pacemaker placement, right lower extremity angiography done by Dr. dew in March 2017 with placement of a stent. there is also note of a successful ablation of the right small saphenous vein done which was reviewed by ultrasound on 10/24/2016. the patient had a right small saphenous vein ablation done on 10/20/2016. The patient has never been a smoker. She has been seen by Dr. Corene Cornea dew the vascular surgeon who most recently saw her on 06/15/2017 for evaluation of ongoing problems with right leg swelling. She had a lower extremity arterial duplex examination done(02/13/17) which showed patent distal right superficial femoral artery stent and above-the-knee popliteal stent without evidence of restenosis. The ABI was more than 1.3 on the right and more than 1.3 on the left. This was consistent with noncompressible arteries due to medial calcification. The right great toe pressure and PPG waveforms are within normal limits and the left great toe pressure and PPG waveforms are decreased. he recommended she continue to wear her compression stockings and continue with elevation. She is scheduled to have a noninvasive arterial study in the near future 08/16/2017 -- had a lower extremity arterial duplex examination done which showed patent distal right superficial  femoral artery stent and above-the-knee popliteal stent without evidence of restenosis. The ABI was more than 1.3 on the right and more than 1.3 on the left. This was consistent with noncompressible arteries due to medial calcification. The right great toe pressure and PPG waveforms are within normal limits and the left great toe pressure and PPG waveforms are decreased. the x-ray of the right ankle has not yet been done 08/24/2017 -- had a right ankle x-ray -- IMPRESSION:1. No fracture, bone lesion or evidence of osteomyelitis. 2. Lateral soft tissue swelling with a soft tissue ulcer. she has not yet seen the vascular surgeon for review 08/31/17 on evaluation today patient's wound appears to be showing signs of improvement.  She still with her appointment with vascular in order to review her results of her vascular study and then determine if any intervention would be recommended at that time. No fevers, chills, nausea, or vomiting noted at this time. She has been tolerating the dressing changes without complication. 09/28/17 on evaluation today patient's wound appears to show signs of good improvement in regard to the granulation tissue which is surfacing. There is still a layer of slough covering the wound and the posterior portion is still significantly deeper than the anterior nonetheless there has been some good sign of things moving towards the better. She is going to go back to Dr. dew for reevaluation to ensure her blood flow is still appropriate. That will be before her next evaluation with [place] next week. No fevers, chills, nausea, or vomiting noted at this time. Patient does have some discomfort rated to be a 3-4/10 depending on activity specifically cleansing the wound makes it worse. 10/05/2017 -- the patient was seen by Dr. Lucky Cowboy last week and noninvasive studies showed a normal right ABI with brisk ELYANAH, FARINO. ([id number]) triphasic waveforms consistent with no arterial  insufficiency including normal digital pressures. The duplex showed a patent distal right SFA stent and the proximal SFA was also normal. He was pleased with her test and thought she should have enough of perfusion for normal wound healing. He would see her back in 6 months time. 12/21/17 on evaluation today patient appears to be doing fairly well in regard to her right lateral ankle wound. Unfortunately the main issue that she is expansion at this point is that she is having some issues with what appears to be some cellulitis in the right anterior shin. She has also been noting a little bit of uncomfortable feeling especially last night and her ankle area. I'm afraid that she made the developing a little bit of an infection. With that being said I think it is in the early stages. 12/28/17 on evaluation today patient's ankle appears to be doing excellent. She's making good progress at this point the cellulitis seems to have improved after last week's evaluation. Overall she is having no significant discomfort which is excellent news. She does have an appointment with Dr. dew on March 29, 2018 for reevaluation in regard to the stent he placed. She seems to have excellent blood flow in the right lower extremity. 01/19/12 on evaluation today patient's wound appears to be doing very well. In fact she does not appear to require debridement at this point, there's no evidence of infection, and overall from the standpoint of the wound she seems to be doing very well. With that being said I believe that it may be time to switch to different dressing away from the [facility] Dressing she tells me she does have a lot going on her friend actually passed away yesterday and she's also having a lot of issues with her husband this obviously is weighing heavy on her as far as your thoughts and concerns today. 01/25/18 on evaluation today patient appears to be doing fairly well in regard to her right lateral  malleolus. She has been tolerating the dressing changes without complication. Overall I feel like this is definitely showing signs of improvement as far as how the overall appearance of the wound is there's also evidence of epithelium start to migrate over the granulation tissue. In general I think that she is progressing nicely as far as the wound is concerned. The only concern she really has  is whether or not we can switch to every other week visits in order to avoid having as many appointments as her daughters have a difficult time getting her to her appointments as well as the patient's husband to his he is not doing very well at this point. 02/22/18 on evaluation today patient's right lateral malleolus ulcer appears to be doing great. She has been tolerating the dressing changes without complication. Overall you making excellent progress at this time. Patient is having no significant discomfort. 03/15/18 on evaluation today patient appears to be doing much more poorly in regard to her right lateral ankle ulcer at this point. Unfortunately since have last seen her her husband has passed just a few days ago is obviously weighed heavily on her her daughter also had surgery well she is with her today as usual. There does not appear to be any evidence of infection she does seem to have significant contusion/deep tissue injury to the right lateral malleolus which was not noted previous when I saw her last. It's hard to tell of exactly when this injury occurred although during the time she was spending the night in the hospital this may have been most likely. 03/22/18 on evaluation today patient appears to actually be doing very well in regard to her ulcer. She did unfortunately have a setback which was noted last week however the good news is we seem to be getting back on track and in fact the wound in the core did still have some necrotic tissue which will be addressed at this point today but in  general I'm seeing signs that things are on the up and up. She is glad to hear this obviously she's been somewhat concerned that due to the how her wound digressed more recently. 03/29/18 on evaluation today patient appears to be doing fairly well in regard to her right lower extremity lateral malleolus ulcer. She unfortunately does have a new area of pressure injury over the inferior portion where the wound has opened up a little bit larger secondary to the pressure she seems to be getting. She does tell me sometimes when she sleeps at night that it actually hurts and does seem to be pushing on the area little bit more unfortunately. There does not appear to be any evidence of infection which is good news. She has been tolerating the dressing changes without complication. She also did have some bruising in the left second and third toes due to the fact that she may have bump this or injured it although she has neuropathy so she does not feel she did move recently that may have been where this came from. Nonetheless there does not appear to be any evidence of infection at this time. 04/12/18 on evaluation today patient's wound on the right lateral ankle actually appears to be doing a little bit better with a lot of necrotic docking tissue centrally loosening up in clearing away. However she does have the beginnings of a deep tissue injury on the left lateral malleolus likely due to the fact we've been trying offload the right as much as we have. I think she may benefit from an assistive soft device to help with offloading and it looks like they're looking at one of the doughnut conditions that wraps around the lower leg to offload which I think will definitely do a good job. With that being said I think we definitely need to address this issue on the left before it becomes a wound. Patient is  not having significant pain. KHRISTINE, VERNO ([id number]) 04/19/18 on evaluation today patient appears to  be doing excellent in regard to the progress she's made with her right lateral ankle ulcer. The left ankle region which did show evidence of a deep tissue injury seems to be resolving there's little fluid noted underneath and a blister there's nothing open at this point in time overall I feel like this is progressing nicely which is good news. She does not seem to be having significant discomfort at this point which is also good news. 04/25/18-She is here in follow up evaluation for bilateral lateral malleolar ulcers. The right lateral malleolus ulcer with pale subcutaneous tissue exposure, central area of ulcer with tendon/periosteum exposed. The left lateral malleolus ulcer now with central area of nonviable tissue, otherwise deep tissue injury. She is wearing compression wraps to the left lower extremity, she will place the right lower extremity compression wraps on when she gets home. She will be out of town over the weekend and return next week and follow-up appointment. She completed her doxycycline this morning 05/03/18 on evaluation today patient appears to be doing very well in regard to her right lateral ankle ulcer in general. At least she's showing some signs of improvement in this regard. Unfortunately she has some additional injury to the left lateral malleolus region which appears to be new likely even over the past several days. Again this determination is based on the overall appearance. With that being said the patient is obviously frustrated about this currently. 05/10/18-She is here in follow-up evaluation for bilateral lateral malleolar ulcers. She states she has purchased offloading shoes/boots and they will arrive tomorrow. She was asked to bring them in the office at next week's appointment so her provider is aware of product being utilized. She continues to sleep on right or left side, she has been encouraged to sleep on her back. The right lateral malleolus ulcer is precariously  close to peri-osteum; will order xray. The left lateral malleolus ulcer is improved. Will switch back to santyl; she will follow up next week. 05/17/18 on evaluation today patient actually appears to be doing very well in regard to her malleolus her ulcers compared to last time I saw them. She does not seem to have as much in the way of contusion at this point which is great news. With that being said she does continue to have discomfort and I do believe that she is still continuing to benefit from the offloading/pressure reducing boots that were recommended. I think this is the key to trying to get this to heal up completely. 05/24/18 on evaluation today patient actually appears to be doing worse at this point in time unfortunately compared to her last week's evaluation. She is having really no increased pain which is good news unfortunately she does have more maceration in your theme and noted surrounding the right lateral ankle the left lateral ankle is not really is erythematous I do not see signs of the overt cellulitis on that side. Unfortunately the wounds do not seem to have shown any signs of improvement since the last evaluation. She also has significant swelling especially on the right compared to previous some of this may be due to infection however also think that she may be served better while she has these wounds by compression wrapping versus continuing to use the Juxta-Lite for the time being. Especially with the amount of drainage that she is experiencing at this point. No fevers, chills, nausea, or  vomiting noted at this time. 05/31/18 on evaluation today patient appears to actually be doing better in regard to her right lateral lower extremity ulcer specifically on the malleolus region. She has been tolerating the antibiotic without complication. With that being said she still continues to have issues but a little bit of redness although nothing like she what she was experiencing  previous. She still continues to pressure to her ankle area she did get the problem on offloading boots unfortunately she will not wear them she states there too uncomfortable and she can't get in and out of the bed. Nonetheless at this point her wounds seem to be continually getting worse which is not what we want I'm getting somewhat concerned about her progress and how things are going to proceed if we do not intervene in some way shape or form. I therefore had a very lengthy conversation today about offloading yet again and even made a specific suggestion for switching her to a memory foam mattress and even gave the information for a specific one that they could look at getting if it was something that they were interested in considering. She does not want to be considered for a hospital bed air mattress although honestly insurance would not cover it that she does not have any wounds on her trunk. 06/14/18 on evaluation today both wounds over the bilateral lateral malleolus her ulcers appear to be doing better there's no evidence of pressure injury at this point. She did get the foam mattress for her bed and this does seem to have been extremely beneficial for her in my pinion. Her daughter states that she is having difficulty getting out of bed because of how soft it is. The patient also relates this to be. Nonetheless I do feel like she's actually doing better. Unfortunately right after and around the time she was getting the mattress she also sustained a fall when she got up to go pick up the phone and ended up injuring her right elbow she has 18 sutures in place. We are not caring for this currently although home health is going to be taking the sutures out shortly. Nonetheless this may be something that we need to evaluate going forward. It depends on how well it has or has not healed in the end. She also recently saw an orthopedic specialist for an injection in the right shoulder just  before her fall unfortunately the fall seems to have worsened her pain. 06/21/18 on evaluation today patient appears to be doing about the same in regard to her lateral malleolus ulcers. Both appear to be just a little bit deeper but again we are clinging away the necrotic and dead tissue which I think is why this is progressing towards a deeper realm as opposed to improving from my measurement standpoint in that regard. Nonetheless she has been tolerating the dressing changes she absolutely hates the memory foam mattress topper that was obtained for her nonetheless Anmol, [place] [name] ([id number]) I do believe this is still doing excellent as far as taking care of excess pressure in regard to the lateral malleolus regions. She in fact has no pressure injury that I see whereas in weeks past it was week by week I was constantly seeing new pressure injuries. Overall I think it has been very beneficial for her. 07/03/18; patient arrives in my clinic today. She has deep punched out areas over her bilateral lateral malleoli. The area on the right has some more depth. We spent a  lot of time today talking about pressure relief for these areas. This started when her daughter asked for a prescription for a memory foam mattress. I have never written a prescription for a mattress and I don't think insurances would pay for that on an ordinary bed. In any case he came up that she has foam boots that she refuses to wear. I would suggest going to these before any other offloading issues when she is in bed. They say she is meticulous about offloading this the rest of the day 07/10/18- She is seen in follow-up evaluation for bilateral, lateral malleolus ulcers. There is no improvement in the ulcers. She has purchased and is sleeping on a memory foam mattress/overlay, she has been using the offloading boots nightly over the past week. She has a follow up appointment with vascular medicine at the end of October, in my  opinion this follow up should be expedited given her deterioration and suboptimal TBI results. We will order plain film xray of the left ankle as deeper structures are palpable; would consider having MRI, regardless of xray report(s). The ulcers will be treated with iodoflex/iodosorb, she is unable to safely change the dressings daily with santyl. 07/19/18 on evaluation today patient appears to be doing in general visually well in regard to her bilateral lateral malleolus ulcers. She has been tolerating the dressing changes without complication which is good news. With that being said we did have an x-ray performed on 07/12/18 which revealed a slight loosen see in the lateral portion of the distal left fibula which may represent artifact but underline lytic destruction or osteomyelitis could not be excluded. MRI was recommended. With that being said we can see about getting the patient scheduled for an MRI to further evaluate this area. In fact we have that scheduled currently for August 20 19,019. 07/26/18 on evaluation today patient's wound on the right lateral ankle actually appears to be doing fairly well at this point in my pinion. She has made some good progress currently. With that being said unfortunately in regard to the left lateral ankle ulcer this seems to be a little bit more problematic at this time. In fact as I further evaluated the situation she actually had bone exposed which is the first time that's been the case in the bone appear to be necrotic. Currently I did review patient's note from Dr. Bunnie Domino office with [facility]. He stated that ABI was 1.26 on the right and 0.95 on the left with good waveforms. Her perfusion is stable not reduced from previous studies and her digital waveforms were pretty good particularly on the right. His conclusion upon review of the note was that there was not much she could do to improve her perfusion and he felt she was adequate  for wound healing. His suggestion was that she continued to see [place] and consider a synthetic skin graft if there was no underlying infection. He plans to see her back in six months or as needed. 08/01/18 on evaluation today patient appears to be doing better in regard to her right lateral ankle ulcer. Her left lateral ankle ulcer is about the same she still has bone involvement in evidence of necrosis. There does not appear to be evidence of infection at this time On the right lateral lower extremity. I have started her on the Augmentin she picked this up and started this yesterday. This is to get her through until she sees infectious disease which is scheduled for  08/12/18. 08/06/18 on evaluation today patient appears to be doing rather well considering my discussion with patient's daughter at the end of last week. The area which was marked where she had erythema seems to be improved and this is good news. With that being said overall the patient seems to be making good improvement when it comes to the overall appearance of the right lateral ankle ulcer although this has been slow she at least is coming around in this regard. Unfortunately in regard to the left lateral ankle ulcer this is osteomyelitis based on the pathology report as well is bone culture. Nonetheless we are still waiting CT scan. Unfortunately the MRI we originally ordered cannot be performed as the patient is a pacemaker which I had overlooked. Nonetheless we are working on the CT scan approval and scheduling as of now. She did go to the hospital over the weekend and was placed on IV Cefzo for a couple of days. Fortunately this seems to have improved the erythema quite significantly which is good news. There does not appear to be any evidence of worsening infection at this time. She did have some bleeding after the last debridement therefore I did not perform any sharp debridement in regard to left lateral ankle at this point. Patient  has been approved for a snap vac for the right lateral ankle. 08/14/18; the patient with wounds over her bilateral lateral malleoli. The area on the right actually looks quite good. Been using a snap back on this area. Healthy granulation and appears to be filling in. Unfortunately the area on the left is really problematic. 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 Also noted to have cellulitis. She saw Dr. Novella Olive of infectious disease today and was put on linezolid. We are able to verify this with her pharmacy. She is completed the Augmentin that she was already on. We've been using Iodoflex to this JANEECE, BLOK ([id number]) area 08/23/18 on evaluation today patient's wounds both actually appear to be doing better compared to my prior evaluations. Fortunately she showing signs of good improvement in regard to the overall wound status especially where were using the snap vac on the right. In regard to left lateral malleolus the wound bed actually appears to be much cleaner than previously noted. I do not feel any phone directly probed during evaluation today and though there is tendon noted this does not appear to be necrotic it's actually fairly good as far as the overall appearance of the tendon is concerned. In general the wound bed actually appears to be doing significantly better than it was previous. Patient is currently in the care of [name] and I did review that note today. He actually has her on two weeks of linezolid and then following the patient will be on 1-2 months of Keflex. That is the plan currently. She has been on antibiotics therapy as prescribed by myself initially starting on July 30, 2018 and has been on that continuously up to this point. 08/30/18 on evaluation today patient actually appears to be doing much better in regard to her right lateral malleolus ulcer. She has been  tolerating the dressing changes specifically the snap vac without complication although she did have some issues with the seal currently. Apparently there was some trouble with getting it to maintain over the past week past Sunday. Nonetheless overall the wound appears better in regard to the right lateral  malleolus region. In regard to left lateral malleolus this actually show some signs of additional granulation although there still tendon noted in the base of the wound this appears to be healthy not necrotic in any way whatsoever. We are considering potentially using a snap vac for the left lateral malleolus as well the product wrap from [employer], [place], was present in the clinic today we're going to see this patient I did have her come in with me after obtaining consent from the patient and her daughter in order to look at the wound and see if there's any recommendation one way or another as to whether or not they felt the snapback could be beneficial for the left lateral malleolus region. But the conclusion was that it might be but that this is definitely a little bit deeper wound than what traditionally would be utilized for a snap vac. 09/06/18 on evaluation today patient actually appears to be doing excellent in my pinion in regard to both ankle ulcers. She has been tolerating the dressing changes without complication which is great news. Specifically we have been using the snap vac. In regard to the right ankle I'm not even sure that this is going to be necessary for today and following as the wound has filled in quite nicely. In regard to the left ankle I do believe that we're seeing excellent epithelialization from the edge as well as granulation in the central portion the tendon is still exposed but there's no evidence of necrotic bone and in general I feel like the patient has made excellent progress even compared to last week with just one week of the snap vac. 09/11/18; this is a patient  who has wounds on her bilateral lateral malleoli. Initially both of these were deep stage IV wounds in the setting of chronic arterial insufficiency. She has been revascularized. As I understand think she been using snap vacs to both of these wounds however the area on the right became more superficial and currently she is only using it on the left. Using silver collagen on the right and silver collagen under the back on the left I believe 09/19/18 on evaluation today patient actually appears to be doing very well in regard to her lateral malleolus or ulcers bilaterally. She has been tolerating the dressing changes without complication. Fortunately there does not appear to be any evidence of infection at this time. Overall I feel like she is improving in an excellent manner and I'm very pleased with the fact that everything seems to be turning towards the better for her. This has obviously been a long road. 09/27/18 on evaluation today patient actually appears to be doing very well in regard to her bilateral lateral malleolus ulcers. She has been tolerating the dressing changes without complication. Fortunately there does not appear to be any evidence of infection at this time which is also great news. No fevers, chills, nausea, or vomiting noted at this time. Overall I feel like she is doing excellent with the snap vac on the left malleolus. She had 40 mL of fluid collection over the past week. 10/04/18 on evaluation today patient actually appears to be doing well in regard to her bilateral lateral malleolus ulcers. She continues to tolerate the dressing changes without complication. One issue that I see is the snap vac on the left lateral malleolus which appears to have sealed off some fluid underlying this area and has not really allowed it to heal to the degree that I would like  to see. For that reason I did suggest at this point we may want to pack a small piece of packing strip into this region  to allow it to more effectively wick out fluid. 10/11/18 in general the patient today does not feel that she has been doing very well. She's been a little bit lethargic and subsequently is having bodyaches as well according to what she tells me today. With that being said overall she has been concerned with the fact that something may be worsening although to be honest her wounds really have not been appearing poorly. She does have a new ulcer on her left heel unfortunately. This may be pressure related. Nonetheless it seems to me to have potentially started at least as a blister I do not see any evidence of deep tissue injury. In regard to the left ankle the snap vac still seems to be causing the ceiling off of the deeper part of the wound which is in turn trapping fluid. I'm not extremely pleased with the overall appearance as far as progress from last week to this week therefore I'm gonna discontinue Lacosse, [name] ([id number]) the snap vac at this point. 10/18/18 patient unfortunately this point has not been feeling well for the past several days. She was seen by Grayland Ormond her primary care provider who is a Librarian, academic at [facility]. Subsequently she states that she's been very weak and generally feeling malaise. No fevers, chills, nausea, or vomiting noted at this time. With that being said bloodwork was performed at the PCP office on the 11th of this month which showed a white blood cell count of 10.7. This was repeated today and shows a white blood cell count of 12.4. This does show signs of worsening. Coupled with the fact that she is feeling worse and that her left ankle wound is not really showing signs of improvement I feel like this is an indication that the osteomyelitis is likely exacerbating not improving. Overall I think we may also want to check her C-reactive protein and sedimentation rate. Actually did call [facility] office this afternoon while the  patient was in the office here with me. Subsequently based on the findings we discussed treatment possibilities and I think that it is appropriate for [place] to go ahead and initiate treatment with doxycycline which I'm going to do. Subsequently he did agree to see about adding a CRP and sedimentation rate to her orders. If that has not already been drawn to where they can run it they will contact the patient she can come back to have that check. They are in agreement with plan as far as the patient and her daughter are concerned. Nonetheless also think we need to get in touch with Dr. Henreitta Leber office to see about getting the patient scheduled with him as soon as possible. 11/08/18 on evaluation today patient presents for follow-up concerning her bilateral foot and ankle ulcers. I did do an extensive review of her chart in epic today. Subsequently she was seen by [name] he did initiate Cefepime IV antibiotic therapy. Subsequently she had some issues with her PICC line this had to be removed because it was coiled and then replaced. Fortunately that was now settled. Unfortunately she has continued have issues with her left heel as well as the issues that she is experiencing with her bilateral lateral malleolus regions. I do believe however both areas seem to be doing a little bit better on evaluation today which is good  news. No fevers, chills, nausea, or vomiting noted at this time. She actually has an angiogram schedule with Dr. dew on this coming Monday, November 11, 2018. Subsequently the patient states that she is feeling much better especially than what she was roughly 2 weeks ago. She actually had to cancel an appointment because she was feeling so poorly. No fevers, chills, nausea, or vomiting noted at this time. 11/15/18 on evaluation today patient actually is status post having had her angiogram with Dr. dew Monday, four days ago. It was noted that she had 60 to 80% stenosis noted in the  extremity. He had to go and work on several areas of the vasculature fortunately he was able to obtain no more than a 30% residual stenosis throughout post procedure. I reviewed this note today. I think this will definitely help with healing at this time. Fortunately there does not appear to be any signs of infection and I do feel like ratio already has a better appearance to it. 11/22/18 upon evaluation today patient actually appears to be doing very well in regard to her wounds in general. The right lateral malleolus looks excellent the heel looks better in the left lateral malleolus also appears to be doing a little better. With that being said the right second toe actually appears to be open and training we been watching this is been dry and stable but now is open. 12/03/2018 Seen today for follow-up and management of multiple bilateral lower extremity wounds. New pressure injury of the great toe which is closed at this time. Wound of the right distal second toe appears larger today with deep undermining and a pocket of fluid present within the undermining region. Left and right malleolus is wounds are stable today with no signs and symptoms of infection.Denies any needs or concerns during exam today. 12/13/18 on evaluation today patient appears to be doing somewhat better in regard to her left heel ulcer. She also seems to be completely healed in regard to the right lateral malleolus ulcer. The left malleolus ulcer is smaller what unfortunately the wounds which are new over the first and second toes of the right foot are what are most concerning at this point especially the second. Both areas did require sharp debridement today. 12/20/18 on evaluation today patient's wound actually appears to be doing better in regard to left lateral ankle and her right lateral ankle continues to remain healed. The hill ulcer on the left is improved. She does have improvement noted as well in regard to both toe  ulcers. Overall I'm very pleased in this regard. No fevers, chills, nausea, or vomiting noted at this time. 12/23/18 on evaluation today patient is seen after she had her toenails trimmed at the podiatrist office due to issues with her right great toe. There was what appeared to be dark eschar on the surface of the wound which had her in the podiatrist concerned. Nonetheless as I remember that during the last office visit I had utilize silver nitrate of this area I was much less concerned about the situation. Subsequently I was able to clean off much of this tissue without any complication today. This does not appear to show any signs of infection and actually look somewhat better compared to last time post debridement. Her second toe on the right foot actually had callous over and there did appear still be some fluid underneath this that would require debridement today. TEMPERANCE, KELEMEN ([id number]) 12/27/18 on evaluation today patient actually appears to  be showing signs of improvement at all locations. Even the left lateral ankle although this is not quite as great as the other sites. Fortunately there does not appear to be any signs of infection at this time and both of her toes on the right foot seem to be showing signs of improvement which is good news and very pleased in this regard. 01/03/19 on evaluation today patient appears to be doing better for the most part in regard to her wounds in particular. There does not appear to be any evidence of infection at this time which is good news. Fortunately there is no sign of really worsening anywhere except for the right great toe which she does have what appears to be a bruise/deep tissue injury which is very superficial and already resolving. I'm not sure where this came from I questioned her extensively and she does not recall what may have happened with this. Other than that the patient seems to be doing well even the left lateral ankle  ulcer looks good and is getting smaller. 01/10/19 on evaluation today patient appears to be doing well in regard to her left heel wound and both of her toe wounds. Overall I feel like there is definitely improvement here and I'm happy in that regard. With that being said unfortunately she is having issues with the left lateral malleolus ulcer which unfortunately still has a lot of depth to it. This is gonna be a very difficult wound for [place] to be able to truly get to heal. I may want to consider some type of skin substitute to see if this would be of benefit for her. I'll discuss this with her more the next visit most likely. This was something I thought about more at the end of the visit when I was Artie out of the room and the patient had been discharged. 01/17/19 on evaluation today patient appears to be doing very well in regard to her wounds in general. She's been making excellent progress at this time. Fortunately there's no sign of infection at this time either. No fevers, chills, nausea, or vomiting noted at this time. The biggest issue is still her left lateral malleolus where it appears to be doing well and is getting smaller but still shows a small corner where this is deeper and goes down into what appears to be the joint space. Nonetheless this is taking much longer to heal although it still looks better in smaller than previous evaluations. 01/24/19 on evaluation today patient's wounds actually appear to be doing rather well in general overall. She did require some sharp debridement in regard to the right great toe but everything else appears to be doing excellent no debridement was even necessary. No fevers, chills, nausea, or vomiting noted at this time. 01/31/19 on evaluation today patient actually appears to be doing much better in regard to her left foot wound on the heel as well as the ankle. The right great toe appears to be a little bit worse today this had callous over and trapped a  lot of fluid underneath. Fortunately there's no signs of infection at any site which is great news. 02/07/19 on evaluation today patient actually appears to be doing decently well in regard to all of her ulcers at this point. No sharp debridement was required she is a little bit of hyper granulation in regard to the left lateral ankle as well as the left heel but the hill itself is almost completely healed which is excellent  news. Overall been very pleased in this regard. Patient History Information obtained from Patient. Family History Cancer - Father,Siblings, Heart Disease - Siblings, No family history of Diabetes, Hereditary Spherocytosis, Hypertension, Kidney Disease, Lung Disease, Seizures, Stroke, Thyroid Problems, Tuberculosis. Social History Never smoker, Marital Status - Married, Alcohol Use - Never, Drug Use - No History, Caffeine Use - Rarely. Medical History Eyes Patient has history of Cataracts - surgery Cardiovascular Patient has history of Congestive Heart Failure, Hypertension Musculoskeletal Patient has history of Osteoarthritis Neurologic Patient has history of Neuropathy Oncologic Denies history of Received Chemotherapy, Received Radiation Cherylene, Ferrufino [name] ([id number]) Review of Systems (ROS) Constitutional Symptoms ([place]) Denies complaints or symptoms of Fever, Chills. Respiratory The patient has no complaints or symptoms. Cardiovascular The patient has no complaints or symptoms. Psychiatric The patient has no complaints or symptoms. Objective Constitutional Well-nourished and well-hydrated in no acute distress. Vitals Time Taken: 10:13 AM, Height: 65 in, Weight: 154.3 lbs, BMI: 25.7, Temperature: 97.6 F, Pulse: 85 bpm, Respiratory Rate: 16 breaths/min, Blood Pressure: 137/56 mmHg. Respiratory normal breathing without difficulty. Psychiatric this patient is able to make decisions and demonstrates good insight into disease process. Alert and  Oriented x 3. pleasant and cooperative. General Notes: Patient's wound bed currently shows evidence of good granulation at this time. In general she's been doing fairly well with the Current wound care measures I'm very pleased with this. Integumentary (Hair, Skin) Wound #2 status is Open. Original cause of wound was Gradually Appeared. The wound is located on the Left,Lateral Malleolus. The wound measures 0.7cm length x 0.7cm width x 0.2cm depth; 0.385cm^2 area and 0.077cm^3 volume. There is Fat Layer (Subcutaneous Tissue) Exposed exposed. There is no tunneling or undermining noted. There is a medium amount of serous drainage noted. The wound margin is flat and intact. There is large (67-100%) red granulation within the wound bed. There is a small (1-33%) amount of necrotic tissue within the wound bed including Adherent Slough. The periwound skin appearance did not exhibit: Callus, Crepitus, Excoriation, Induration, Rash, Scarring, Dry/Scaly, Maceration, Atrophie Blanche, Cyanosis, Ecchymosis, Hemosiderin Staining, Mottled, Pallor, Rubor, Erythema. Periwound temperature was noted as No Abnormality. Wound #3 status is Open. Original cause of wound was Gradually Appeared. The wound is located on the Left Calcaneus. The wound measures 0.9cm length x 0.7cm width x 0.1cm depth; 0.495cm^2 area and 0.049cm^3 volume. There is Fat Layer (Subcutaneous Tissue) Exposed exposed. There is no tunneling or undermining noted. There is a small amount of serous drainage noted. The wound margin is indistinct and nonvisible. There is small (1-33%) pink granulation within the wound bed. There is a medium (34-66%) amount of necrotic tissue within the wound bed including Eschar and Adherent Slough. The periwound skin appearance exhibited: Dry/Scaly, Maceration. The periwound skin appearance did not exhibit: Callus, Crepitus, Excoriation, Induration, Rash, Scarring, Atrophie Blanche, Cyanosis, Ecchymosis, Hemosiderin  Staining, Mottled, Pallor, Rubor, Erythema. Periwound temperature was noted as No Abnormality. Wound #5 status is Open. Original cause of wound was Footwear Injury. The wound is located on the Right,Distal [employer] HENRETTER, PIEKARSKI ([id number]) The wound measures 0.8cm length x 1.1cm width x 0.2cm depth; 0.691cm^2 area and 0.138cm^3 volume. There is Fat Layer (Subcutaneous Tissue) Exposed exposed. There is no tunneling or undermining noted. There is a large amount of serous drainage noted. The wound margin is flat and intact. There is small (1-33%) red granulation within the wound bed. There is a medium (34-66%) amount of necrotic tissue within the wound bed including Eschar and Adherent  Slough. The periwound skin appearance exhibited: Dry/Scaly, Ecchymosis. The periwound skin appearance did not exhibit: Callus, Crepitus, Excoriation, Induration, Rash, Scarring, Maceration, Atrophie Blanche, Cyanosis, Hemosiderin Staining, Mottled, Pallor, Rubor, Erythema. Periwound temperature was noted as No Abnormality. Assessment Active Problems ICD-10 Pressure ulcer of right ankle, stage 4 Pressure ulcer of left ankle, stage 4 Pressure ulcer of left heel, stage 3 Pressure ulcer of other site, stage 2 Atherosclerosis of native arteries of right leg with ulceration of ankle Atherosclerosis of native arteries of left leg with ulceration of ankle Lymphedema, not elsewhere classified Tinea corporis Chronic multifocal osteomyelitis, left ankle and foot Procedures Wound #2 Pre-procedure diagnosis of Wound #2 is a Pressure Ulcer located on the Left,Lateral Malleolus . An CHEM CAUT GRANULATION TISS procedure was performed by STONE III, HOYT E., PA-C. Post procedure Diagnosis Wound #2: Same as Pre-Procedure Notes: 1 silver nitrate stick used Wound #3 Pre-procedure diagnosis of Wound #3 is a Pressure Ulcer located on the Left Calcaneus . An CHEM CAUT GRANULATION TISS procedure was performed by STONE III,  HOYT E., PA-C. Post procedure Diagnosis Wound #3: Same as Pre-Procedure Notes: 1 silver nitrate stick used Plan Wound Cleansing: Wound #2 Left,Lateral Malleolus: Clean wound with Normal Saline. Cleanse wound with mild soap and water May Shower, gently pat wound dry prior to applying new dressing. Wound #3 Left Calcaneus: Clean wound with Normal Saline. KAYLIANA, CODD ([id number]) Cleanse wound with mild soap and water May Shower, gently pat wound dry prior to applying new dressing. Wound #5 Right,Distal Toe Great: Clean wound with Normal Saline. Cleanse wound with mild soap and water May Shower, gently pat wound dry prior to applying new dressing. Anesthetic (add to Medication List): Wound #2 Left,Lateral Malleolus: Topical Lidocaine 4% cream applied to wound bed prior to debridement (In Clinic Only). Wound #3 Left Calcaneus: Topical Lidocaine 4% cream applied to wound bed prior to debridement (In Clinic Only). Wound #5 Right,Distal Toe Great: Topical Lidocaine 4% cream applied to wound bed prior to debridement (In Clinic Only). Primary Wound Dressing: Wound #2 Left,Lateral Malleolus: Silver Collagen - small piece of silver collagen to tunnel with iodoform packing over Iodoform packing Gauze Wound #3 Left Calcaneus: Silver Collagen Wound #5 Right,Distal Toe Great: Silver Alginate Secondary Dressing: Wound #2 Left,Lateral Malleolus: Boardered Foam Dressing Wound #3 Left Calcaneus: Boardered Foam Dressing Wound #5 Right,Distal Toe Great: Other - Coverlet or bandaid Dressing Change Frequency: Wound #2 Left,Lateral Malleolus: Change Dressing Monday, Wednesday, Friday Wound #3 Left Calcaneus: Change Dressing Monday, Wednesday, Friday Wound #5 Right,Distal Toe Great: Change Dressing Monday, Wednesday, Friday Follow-up Appointments: Wound #2 Left,Lateral Malleolus: Return Appointment in 1 week. Wound #3 Left Calcaneus: Return Appointment in 1 week. Wound #5  Right,Distal Toe Great: Return Appointment in 1 week. Edema Control: Wound #2 Left,Lateral Malleolus: Patient to wear own compression stockings Patient to wear own Velcro compression garment. Elevate legs to the level of the heart and pump ankles as often as possible Wound #3 Left Calcaneus: Patient to wear own compression stockings Patient to wear own Velcro compression garment. Elevate legs to the level of the heart and pump ankles as often as possible Off-Loading: Wound #2 Left,Lateral Malleolus: Turn and reposition every 2 hours Other: - do not put pressure on your ankles Wound #3 Left Calcaneus: Turn and reposition every 2 hours Other: - do not put pressure on your ankles Wound #5 Right,Distal Toe Great: Turn and reposition every 2 hours Hammers, [name] ([id number]) Other: - do not put pressure on your ankles  Home Health: Wound #2 Left,Lateral Malleolus: [place] [facility] - [facility]. Please visit patient Monday, Wednesday, and Friday Home Health Nurse may visit PRN to address patient s wound care needs. FACE TO FACE ENCOUNTER: MEDICARE and MEDICAID PATIENTS: I certify that this patient is under my care and that I had a face-to-face encounter that meets the physician face-to-face encounter requirements with this patient on this date. The encounter with the patient was in whole or in part for the following MEDICAL CONDITION: (primary reason for [place]) MEDICAL NECESSITY: I certify, that based on my findings, NURSING services are a medically necessary home health service. HOME BOUND STATUS: I certify that my clinical findings support that this patient is homebound (i.e., Due to illness or injury, pt requires aid of supportive devices such as crutches, cane, wheelchairs, walkers, the use of special transportation or the assistance of another person to leave their place of residence. There is a normal inability to leave the home and doing so requires  considerable and taxing effort. Other absences are for medical reasons / religious services and are infrequent or of short duration when for other reasons). If current dressing causes regression in wound condition, may D/C ordered dressing product/s and apply Normal Saline Moist Dressing daily until next [place] / Other MD appointment. [place] of regression in wound condition at [phone]. Please direct any NON-WOUND related issues/requests for orders to patient's Primary Care Physician Wound #3 Left Calcaneus: [place] Visits - [facility]. Please visit patient Monday, Wednesday, and Friday Home Health Nurse may visit PRN to address patient s wound care needs. FACE TO FACE ENCOUNTER: MEDICARE and MEDICAID PATIENTS: I certify that this patient is under my care and that I had a face-to-face encounter that meets the physician face-to-face encounter requirements with this patient on this date. The encounter with the patient was in whole or in part for the following MEDICAL CONDITION: (primary reason for [place]) MEDICAL NECESSITY: I certify, that based on my findings, NURSING services are a medically necessary home health service. HOME BOUND STATUS: I certify that my clinical findings support that this patient is homebound (i.e., Due to illness or injury, pt requires aid of supportive devices such as crutches, cane, wheelchairs, walkers, the use of special transportation or the assistance of another person to leave their place of residence. There is a normal inability to leave the home and doing so requires considerable and taxing effort. Other absences are for medical reasons / religious services and are infrequent or of short duration when for other reasons). If current dressing causes regression in wound condition, may D/C ordered dressing product/s and apply Normal Saline Moist Dressing daily until next [place] / Other MD  appointment. [place] of regression in wound condition at [phone]. Please direct any NON-WOUND related issues/requests for orders to patient's Primary Care Physician Wound #5 Right,Distal Toe Great: [place] Visits - [facility]. Please visit patient Monday, Wednesday, and Friday Home Health Nurse may visit PRN to address patient s wound care needs. FACE TO FACE ENCOUNTER: MEDICARE and MEDICAID PATIENTS: I certify that this patient is under my care and that I had a face-to-face encounter that meets the physician face-to-face encounter requirements with this patient on this date. The encounter with the patient was in whole or in part for the following MEDICAL CONDITION: (primary reason for [place]) MEDICAL NECESSITY: I certify, that based on my findings, NURSING services are a medically necessary  home health service. HOME BOUND STATUS: I certify that my clinical findings support that this patient is homebound (i.e., Due to illness or injury, pt requires aid of supportive devices such as crutches, cane, wheelchairs, walkers, the use of special transportation or the assistance of another person to leave their place of residence. There is a normal inability to leave the home and doing so requires considerable and taxing effort. Other absences are for medical reasons / religious services and are infrequent or of short duration when for other reasons). If current dressing causes regression in wound condition, may D/C ordered dressing product/s and apply Normal Saline Moist Dressing daily until next [place] / Other MD appointment. [place] of regression in wound condition at [phone]. Please direct any NON-WOUND related issues/requests for orders to patient's Primary Care Physician My suggestion currently is gonna be that we actually go ahead and continue with the above wound care measures. I did actually perform chemical  cauterization silver nitrate today which the patient tolerated without complication this is on the left ankle as well as the left heel. We will see were things stand at follow-up in one weeks time. Anything changes or worsens in the meantime she will contact the office and let me know. ALZENA, GERBER ([id number]) Please see above for specific wound care orders. We will see patient for re-evaluation in 1 week(s) here in the clinic. If anything worsens or changes patient will contact our office for additional recommendations. Electronic Signature(s) Signed: 02/08/2019 6:13:30 AM By: Worthy Keeler PA-C Entered By: Worthy Keeler on 02/08/2019 05:12:07 [name] ([id number]) -------------------------------------------------------------------------------- ROS/PFSH Details Patient Name: [name] Date of Service: 02/07/2019 10:15 AM Medical Record Number: [id number] Patient Account Number: [id number] Date of Birth/Sex: 02/07/1923 (85 y.o. F) Treating RN: Montey Hora Primary Care Provider: Grayland Ormond Other Clinician: Referring Provider: Grayland Ormond Treating Provider/Extender: Melburn Hake, HOYT Weeks in Treatment: 1 Information Obtained From Patient Wound History Do you currently have one or more open woundso Yes How many open wounds do you currently haveo 1 Approximately how long have you had your woundso 2 yrs How have you been treating your wound(s) until nowo mupirocin, soaking in epsom salt Has your wound(s) ever healed and then re-openedo No Have you had any lab work done in the past montho No Have you tested positive for an antibiotic resistant organism (MRSA, VRE)o No Have you tested positive for osteomyelitis (bone infection)o No Have you had any tests for circulation on your legso Yes Who ordered the testo Dr. Lucky Cowboy [facility] Constitutional Symptoms (General Health) Complaints and Symptoms: Negative for: Fever;  Chills Eyes Medical History: Positive for: Cataracts - surgery Respiratory Complaints and Symptoms: No Complaints or Symptoms Cardiovascular Complaints and Symptoms: No Complaints or Symptoms Medical History: Positive for: Congestive Heart Failure; Hypertension Musculoskeletal Medical History: Positive for: Osteoarthritis Neurologic Medical History: Positive for: Neuropathy ABREA, HENLE ([id number]) Oncologic Medical History: Negative for: Received Chemotherapy; Received Radiation Psychiatric Complaints and Symptoms: No Complaints or Symptoms HBO Extended History Items Eyes: Cataracts Immunizations Pneumococcal Vaccine: Received Pneumococcal Vaccination: Yes Implantable Devices No devices added Family and Social History Cancer: Yes - Father,Siblings; Diabetes: No; Heart Disease: Yes - Siblings; Hereditary Spherocytosis: No; Hypertension: No; Kidney Disease: No; Lung Disease: No; Seizures: No; Stroke: No; Thyroid Problems: No; Tuberculosis: No; Never smoker; Marital Status - Married; Alcohol Use: Never; Drug Use: No History; Caffeine Use: Rarely; Financial Concerns: No; Food, Clothing or Shelter Needs:  No; Support System Lacking: No; Transportation Concerns: No; Advanced Directives: No; Patient does not want information on Advanced Directives; Do not resuscitate: No; Living Will: Yes (Not Provided); Medical Power of Attorney: No Physician Affirmation I have reviewed and agree with the above information. Electronic Signature(s) Signed: 02/08/2019 6:13:30 AM By: Worthy Keeler PA-C Signed: 02/10/2019 10:44:46 AM By: Montey Hora Entered By: Worthy Keeler on 02/08/2019 05:10:57 Bearce, [name] ([id number]) -------------------------------------------------------------------------------- SuperBill Details Patient Name: [name] Date of Service: 02/07/2019 Medical Record Number: [id number] Patient Account Number: [id number] Date of Birth/Sex: 1923-04-16  (85 y.o. 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 I70.243 Atherosclerosis of native arteries of left leg with ulceration of ankle I89.0 Lymphedema, not elsewhere classified B35.4 Tinea corporis 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, left ankle and foot Facility Procedures CPT4 Code: [id number] Description: 37169 - CHEM CAUT GRANULATION TISS ICD-10 Diagnosis Description L89.524 Pressure ulcer of left ankle, stage 4 L89.623 Pressure ulcer of left heel, stage 3 Modifier: Quantity: 2 Physician Procedures CPT4 Code: [id number] Description: 01751 - WC PHYS CHEM CAUT GRAN TISSUE ICD-10 Diagnosis Description L89.524 Pressure ulcer of left ankle, stage 4 L89.623 Pressure ulcer of left heel, stage 3 Modifier: Quantity: 2 Electronic Signature(s) Signed: 02/08/2019 6:13:30 AM By: Worthy Keeler PA-C Entered By: Worthy Keeler on 02/08/2019 05:14:50

## 2019-02-14 ENCOUNTER — Other Ambulatory Visit: Payer: Self-pay

## 2019-02-14 ENCOUNTER — Encounter: Payer: Medicare Other | Admitting: Physician Assistant

## 2019-02-14 DIAGNOSIS — I70233 Atherosclerosis of native arteries of right leg with ulceration of ankle: Secondary | ICD-10-CM | POA: Diagnosis not present

## 2019-02-16 NOTE — Progress Notes (Addendum)
[name] ([id number]) Visit Report for 02/14/2019 Chief Complaint Document Details Patient Name: [name]. Date of Service: 02/14/2019 10:30 AM Medical Record Number: [id number] Patient Account Number: [id number] Date of Birth/Sex: [birth year] (85 y.o. F) Treating RN: Montey Hora Primary Care Provider: Grayland Ormond Other Clinician: Referring Provider: Grayland Ormond Treating Provider/Extender: Melburn Hake, HOYT Weeks in Treatment: 59 Information Obtained from: Patient Chief Complaint Patient is here for right lateral malleolus, right 2nd distal toe and left lateral malleolus ulcer Electronic Signature(s) Signed: 02/14/2019 5:40:27 PM By: Worthy Keeler PA-C Entered By: Worthy Keeler on 02/14/2019 10:43:07 [name], [name] ([id number]) -------------------------------------------------------------------------------- HPI Details Patient Name: [name] Date of Service: 02/14/2019 10:30 AM Medical Record Number: [id number] Patient Account Number: [id number] Date of Birth/Sex: May 13, 1923 (85 y.o. 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 Dr. Amalia Hailey the podiatrist saw her on 07/20/2017 and sent her to the wound center for possible hyperbaric oxygen therapy. past medical history of peripheral vascular disease, varicose veins, status post appendectomy, basal cell carcinoma excision from the left leg, cholecystectomy, pacemaker placement, right lower extremity angiography done by Dr. dew in March 2017 with placement of a stent. there is also note of a successful ablation of the  right small saphenous vein done which was reviewed by ultrasound on 10/24/2016. the patient had a right small saphenous vein ablation done on 10/20/2016. The patient has never been a smoker. She has been seen by Dr. Corene Cornea dew the vascular surgeon who most recently saw her on 06/15/2017 for evaluation of ongoing problems with right leg swelling. She had a lower extremity arterial duplex examination done(02/13/17) which showed patent distal right superficial femoral artery stent and above-the-knee popliteal stent without evidence of restenosis. The ABI was more than 1.3 on the right and more than 1.3 on the left. This was consistent with noncompressible arteries due to medial calcification. The right great toe pressure and PPG waveforms are within normal limits and the left great toe pressure and PPG waveforms are decreased. he recommended she continue to wear her compression stockings and continue with elevation. She is scheduled to have a noninvasive arterial study in the near future 08/16/2017 -- had a lower extremity arterial duplex examination done which showed patent distal right superficial femoral artery stent and above-the-knee popliteal stent without evidence of restenosis. The ABI was more than 1.3 on the right and more than 1.3 on the left. This was consistent with noncompressible arteries due to medial calcification. The right great toe pressure and PPG waveforms are within normal limits and the left great toe pressure and PPG waveforms are decreased. the x-ray of the right ankle has not yet been done 08/24/2017 -- had a right ankle x-ray -- IMPRESSION:1. No fracture, bone lesion or evidence of osteomyelitis. 2. Lateral soft tissue swelling with a soft tissue ulcer. she has not yet seen the vascular surgeon for review 08/31/17 on evaluation today patient's wound appears to be showing signs of improvement. She still with her appointment with vascular in order to review her results of her  vascular study and then determine if any intervention would be recommended at that time. No fevers, chills, nausea, or vomiting noted at  this time. She has been tolerating the dressing changes without complication. 09/28/17 on evaluation today patient's wound appears to show signs of good improvement in regard to the granulation tissue which is surfacing. There is still a layer of slough covering the wound and the posterior portion is still significantly deeper than the anterior nonetheless there has been some good sign of things moving towards the better. She is going to go back to Dr. dew for reevaluation to ensure her blood flow is still appropriate. That will be before her next evaluation with [place] next week. No fevers, chills, nausea, or vomiting noted at this time. Patient does have some discomfort rated to be a 3-4/10 depending on activity specifically cleansing the wound makes it worse. 10/05/2017 -- the patient was seen by Dr. Lucky Cowboy last week and noninvasive studies showed a normal right ABI with brisk triphasic waveforms consistent with no arterial insufficiency including normal digital pressures. The duplex showed a patent distal right SFA stent and the proximal SFA was also normal. He was pleased with her test and thought she should have enough of perfusion for normal wound healing. He would see her back in 6 months time. 12/21/17 on evaluation today patient appears to be doing fairly well in regard to her right lateral ankle wound. Unfortunately the main issue that she is expansion at this point is that she is having some issues with what appears to be some cellulitis in the [name]. ([id number]) right anterior shin. She has also been noting a little bit of uncomfortable feeling especially last night and her ankle area. I'm afraid that she made the developing a little bit of an infection. With that being said I think it is in the early stages. 12/28/17 on evaluation today patient's  ankle appears to be doing excellent. She's making good progress at this point the cellulitis seems to have improved after last week's evaluation. Overall she is having no significant discomfort which is excellent news. She does have an appointment with Dr. dew on March 29, 2018 for reevaluation in regard to the stent he placed. She seems to have excellent blood flow in the right lower extremity. 01/19/12 on evaluation today patient's wound appears to be doing very well. In fact she does not appear to require debridement at this point, there's no evidence of infection, and overall from the standpoint of the wound she seems to be doing very well. With that being said I believe that it may be time to switch to different dressing away from the [facility] she tells me she does have a lot going on her friend actually passed away yesterday and she's also having a lot of issues with her husband this obviously is weighing heavy on her as far as your thoughts and concerns today. 01/25/18 on evaluation today patient appears to be doing fairly well in regard to her right lateral malleolus. She has been tolerating the dressing changes without complication. Overall I feel like this is definitely showing signs of improvement as far as how the overall appearance of the wound is there's also evidence of epithelium start to migrate over the granulation tissue. In general I think that she is progressing nicely as far as the wound is concerned. The only concern she really has is whether or not we can switch to every other week visits in order to avoid having as many appointments as her daughters have a difficult time getting her to her appointments as well as the patient's husband  to his he is not doing very well at this point. 02/22/18 on evaluation today patient's right lateral malleolus ulcer appears to be doing great. She has been tolerating the dressing changes without complication. Overall you making  excellent progress at this time. Patient is having no significant discomfort. 03/15/18 on evaluation today patient appears to be doing much more poorly in regard to her right lateral ankle ulcer at this point. Unfortunately since have last seen her her husband has passed just a few days ago is obviously weighed heavily on her her daughter also had surgery well she is with her today as usual. There does not appear to be any evidence of infection she does seem to have significant contusion/deep tissue injury to the right lateral malleolus which was not noted previous when I saw her last. It's hard to tell of exactly when this injury occurred although during the time she was spending the night in the hospital this may have been most likely. 03/22/18 on evaluation today patient appears to actually be doing very well in regard to her ulcer. She did unfortunately have a setback which was noted last week however the good news is we seem to be getting back on track and in fact the wound in the core did still have some necrotic tissue which will be addressed at this point today but in general I'm seeing signs that things are on the up and up. She is glad to hear this obviously she's been somewhat concerned that due to the how her wound digressed more recently. 03/29/18 on evaluation today patient appears to be doing fairly well in regard to her right lower extremity lateral malleolus ulcer. She unfortunately does have a new area of pressure injury over the inferior portion where the wound has opened up a little bit larger secondary to the pressure she seems to be getting. She does tell me sometimes when she sleeps at night that it actually hurts and does seem to be pushing on the area little bit more unfortunately. There does not appear to be any evidence of infection which is good news. She has been tolerating the dressing changes without complication. She also did have some bruising in the left second and  third toes due to the fact that she may have bump this or injured it although she has neuropathy so she does not feel she did move recently that may have been where this came from. Nonetheless there does not appear to be any evidence of infection at this time. 04/12/18 on evaluation today patient's wound on the right lateral ankle actually appears to be doing a little bit better with a lot of necrotic docking tissue centrally loosening up in clearing away. However she does have the beginnings of a deep tissue injury on the left lateral malleolus likely due to the fact we've been trying offload the right as much as we have. I think she may benefit from an assistive soft device to help with offloading and it looks like they're looking at one of the doughnut conditions that wraps around the lower leg to offload which I think will definitely do a good job. With that being said I think we definitely need to address this issue on the left before it becomes a wound. Patient is not having significant pain. 04/19/18 on evaluation today patient appears to be doing excellent in regard to the progress she's made with her right lateral ankle ulcer. The left ankle region which did show evidence of a  deep tissue injury seems to be resolving there's little fluid noted underneath and a blister there's nothing open at this point in time overall I feel like this is progressing nicely which is good news. She does not seem to be having significant discomfort at this point which is also good news. 04/25/18-She is here in follow up evaluation for bilateral lateral malleolar ulcers. The right lateral malleolus ulcer with pale subcutaneous tissue exposure, central area of ulcer with tendon/periosteum exposed. The left lateral malleolus ulcer now with Torrisi, [name] ([id number]) central area of nonviable tissue, otherwise deep tissue injury. She is wearing compression wraps to the left lower extremity, she will place the  right lower extremity compression wraps on when she gets home. She will be out of town over the weekend and return next week and follow-up appointment. She completed her doxycycline this morning 05/03/18 on evaluation today patient appears to be doing very well in regard to her right lateral ankle ulcer in general. At least she's showing some signs of improvement in this regard. Unfortunately she has some additional injury to the left lateral malleolus region which appears to be new likely even over the past several days. Again this determination is based on the overall appearance. With that being said the patient is obviously frustrated about this currently. 05/10/18-She is here in follow-up evaluation for bilateral lateral malleolar ulcers. She states she has purchased offloading shoes/boots and they will arrive tomorrow. She was asked to bring them in the office at next week's appointment so her provider is aware of product being utilized. She continues to sleep on right or left side, she has been encouraged to sleep on her back. The right lateral malleolus ulcer is precariously close to peri-osteum; will order xray. The left lateral malleolus ulcer is improved. Will switch back to santyl; she will follow up next week. 05/17/18 on evaluation today patient actually appears to be doing very well in regard to her malleolus her ulcers compared to last time I saw them. She does not seem to have as much in the way of contusion at this point which is great news. With that being said she does continue to have discomfort and I do believe that she is still continuing to benefit from the offloading/pressure reducing boots that were recommended. I think this is the key to trying to get this to heal up completely. 05/24/18 on evaluation today patient actually appears to be doing worse at this point in time unfortunately compared to her last week's evaluation. She is having really no increased pain which is good  news unfortunately she does have more maceration in your theme and noted surrounding the right lateral ankle the left lateral ankle is not really is erythematous I do not see signs of the overt cellulitis on that side. Unfortunately the wounds do not seem to have shown any signs of improvement since the last evaluation. She also has significant swelling especially on the right compared to previous some of this may be due to infection however also think that she may be served better while she has these wounds by compression wrapping versus continuing to use the Juxta-Lite for the time being. Especially with the amount of drainage that she is experiencing at this point. No fevers, chills, nausea, or vomiting noted at this time. 05/31/18 on evaluation today patient appears to actually be doing better in regard to her right lateral lower extremity ulcer specifically on the malleolus region. She has been tolerating the antibiotic without  complication. With that being said she still continues to have issues but a little bit of redness although nothing like she what she was experiencing previous. She still continues to pressure to her ankle area she did get the problem on offloading boots unfortunately she will not wear them she states there too uncomfortable and she can't get in and out of the bed. Nonetheless at this point her wounds seem to be continually getting worse which is not what we want I'm getting somewhat concerned about her progress and how things are going to proceed if we do not intervene in some way shape or form. I therefore had a very lengthy conversation today about offloading yet again and even made a specific suggestion for switching her to a memory foam mattress and even gave the information for a specific one that they could look at getting if it was something that they were interested in considering. She does not want to be considered for a hospital bed air mattress although honestly  insurance would not cover it that she does not have any wounds on her trunk. 06/14/18 on evaluation today both wounds over the bilateral lateral malleolus her ulcers appear to be doing better there's no evidence of pressure injury at this point. She did get the foam mattress for her bed and this does seem to have been extremely beneficial for her in my pinion. Her daughter states that she is having difficulty getting out of bed because of how soft it is. The patient also relates this to be. Nonetheless I do feel like she's actually doing better. Unfortunately right after and around the time she was getting the mattress she also sustained a fall when she got up to go pick up the phone and ended up injuring her right elbow she has 18 sutures in place. We are not caring for this currently although home health is going to be taking the sutures out shortly. Nonetheless this may be something that we need to evaluate going forward. It depends on how well it has or has not healed in the end. She also recently saw an orthopedic specialist for an injection in the right shoulder just before her fall unfortunately the fall seems to have worsened her pain. 06/21/18 on evaluation today patient appears to be doing about the same in regard to her lateral malleolus ulcers. Both appear to be just a little bit deeper but again we are clinging away the necrotic and dead tissue which I think is why this is progressing towards a deeper realm as opposed to improving from my measurement standpoint in that regard. Nonetheless she has been tolerating the dressing changes she absolutely hates the memory foam mattress topper that was obtained for her nonetheless I do believe this is still doing excellent as far as taking care of excess pressure in regard to the lateral malleolus regions. She in fact has no pressure injury that I see whereas in weeks past it was week by week I was constantly seeing new pressure injuries. Overall  I think it has been very beneficial for her. 07/03/18; patient arrives in my clinic today. She has deep punched out areas over her bilateral lateral malleoli. The area on the right has some more depth. [name], [name] ([id number]) We spent a lot of time today talking about pressure relief for these areas. This started when her daughter asked for a prescription for a memory foam mattress. I have never written a prescription for a mattress and I don't  think insurances would pay for that on an ordinary bed. In any case he came up that she has foam boots that she refuses to wear. I would suggest going to these before any other offloading issues when she is in bed. They say she is meticulous about offloading this the rest of the day 07/10/18- She is seen in follow-up evaluation for bilateral, lateral malleolus ulcers. There is no improvement in the ulcers. She has purchased and is sleeping on a memory foam mattress/overlay, she has been using the offloading boots nightly over the past week. She has a follow up appointment with vascular medicine at the end of October, in my opinion this follow up should be expedited given her deterioration and suboptimal TBI results. We will order plain film xray of the left ankle as deeper structures are palpable; would consider having MRI, regardless of xray report(s). The ulcers will be treated with iodoflex/iodosorb, she is unable to safely change the dressings daily with santyl. 07/19/18 on evaluation today patient appears to be doing in general visually well in regard to her bilateral lateral malleolus ulcers. She has been tolerating the dressing changes without complication which is good news. With that being said we did have an x-ray performed on 07/12/18 which revealed a slight loosen see in the lateral portion of the distal left fibula which may represent artifact but underline lytic destruction or osteomyelitis could not be excluded. MRI was recommended. With  that being said we can see about getting the patient scheduled for an MRI to further evaluate this area. In fact we have that scheduled currently for August 20 19,019. 07/26/18 on evaluation today patient's wound on the right lateral ankle actually appears to be doing fairly well at this point in my pinion. She has made some good progress currently. With that being said unfortunately in regard to the left lateral ankle ulcer this seems to be a little bit more problematic at this time. In fact as I further evaluated the situation she actually had bone exposed which is the first time that's been the case in the bone appear to be necrotic. Currently I did review patient's note from Dr. Bunnie Domino office with [facility]. He stated that ABI was 1.26 on the right and 0.95 on the left with good waveforms. Her perfusion is stable not reduced from previous studies and her digital waveforms were pretty good particularly on the right. His conclusion upon review of the note was that there was not much she could do to improve her perfusion and he felt she was adequate for wound healing. His suggestion was that she continued to see [place] and consider a synthetic skin graft if there was no underlying infection. He plans to see her back in six months or as needed. 08/01/18 on evaluation today patient appears to be doing better in regard to her right lateral ankle ulcer. Her left lateral ankle ulcer is about the same she still has bone involvement in evidence of necrosis. There does not appear to be evidence of infection at this time On the right lateral lower extremity. I have started her on the Augmentin she picked this up and started this yesterday. This is to get her through until she sees infectious disease which is scheduled for 08/12/18. 08/06/18 on evaluation today patient appears to be doing rather well considering my discussion with patient's daughter at the end of last week. The area which was  marked where she had erythema seems to be improved  and this is good news. With that being said overall the patient seems to be making good improvement when it comes to the overall appearance of the right lateral ankle ulcer although this has been slow she at least is coming around in this regard. Unfortunately in regard to the left lateral ankle ulcer this is osteomyelitis based on the pathology report as well is bone culture. Nonetheless we are still waiting CT scan. Unfortunately the MRI we originally ordered cannot be performed as the patient is a pacemaker which I had overlooked. Nonetheless we are working on the CT scan approval and scheduling as of now. She did go to the hospital over the weekend and was placed on IV Cefzo for a couple of days. Fortunately this seems to have improved the erythema quite significantly which is good news. There does not appear to be any evidence of worsening infection at this time. She did have some bleeding after the last debridement therefore I did not perform any sharp debridement in regard to left lateral ankle at this point. Patient has been approved for a snap vac for the right lateral ankle. 08/14/18; the patient with wounds over her bilateral lateral malleoli. The area on the right actually looks quite good. Been using a snap back on this area. Healthy granulation and appears to be filling in. Unfortunately the area on the left is really problematic. 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 Also noted to have cellulitis. She saw Dr. Novella Olive of infectious disease today and was put on linezolid. We are able to verify this with her pharmacy. She is completed the Augmentin that she was already on. We've been using Iodoflex to this area 08/23/18 on evaluation today patient's wounds both actually appear to be doing better compared to my prior evaluations. Fortunately she showing  signs of good improvement in regard to the overall wound status especially where were using the snap vac on the right. In regard to left lateral malleolus the wound bed actually appears to be much cleaner than previously noted. I do not feel any phone directly probed during evaluation today and though there is tendon noted this does not appear [name], [name] ([id number]) to be necrotic it's actually fairly good as far as the overall appearance of the tendon is concerned. In general the wound bed actually appears to be doing significantly better than it was previous. Patient is currently in the care of [name] and I did review that note today. He actually has her on two weeks of linezolid and then following the patient will be on 1-2 months of Keflex. That is the plan currently. She has been on antibiotics therapy as prescribed by myself initially starting on July 30, 2018 and has been on that continuously up to this point. 08/30/18 on evaluation today patient actually appears to be doing much better in regard to her right lateral malleolus ulcer. She has been tolerating the dressing changes specifically the snap vac without complication although she did have some issues with the seal currently. Apparently there was some trouble with getting it to maintain over the past week past Sunday. Nonetheless overall the wound appears better in regard to the right lateral malleolus region. In regard to left lateral malleolus this actually show some signs of additional granulation although there still tendon noted in the base of the wound this appears to be healthy not necrotic in any way whatsoever.  We are considering potentially using a snap vac for the left lateral malleolus as well the product wrap from [employer], [place], was present in the clinic today we're going to see this patient I did have her come in with me after obtaining consent from the patient and her daughter in order to look at the wound and  see if there's any recommendation one way or another as to whether or not they felt the snapback could be beneficial for the left lateral malleolus region. But the conclusion was that it might be but that this is definitely a little bit deeper wound than what traditionally would be utilized for a snap vac. 09/06/18 on evaluation today patient actually appears to be doing excellent in my pinion in regard to both ankle ulcers. She has been tolerating the dressing changes without complication which is great news. Specifically we have been using the snap vac. In regard to the right ankle I'm not even sure that this is going to be necessary for today and following as the wound has filled in quite nicely. In regard to the left ankle I do believe that we're seeing excellent epithelialization from the edge as well as granulation in the central portion the tendon is still exposed but there's no evidence of necrotic bone and in general I feel like the patient has made excellent progress even compared to last week with just one week of the snap vac. 09/11/18; this is a patient who has wounds on her bilateral lateral malleoli. Initially both of these were deep stage IV wounds in the setting of chronic arterial insufficiency. She has been revascularized. As I understand think she been using snap vacs to both of these wounds however the area on the right became more superficial and currently she is only using it on the left. Using silver collagen on the right and silver collagen under the back on the left I believe 09/19/18 on evaluation today patient actually appears to be doing very well in regard to her lateral malleolus or ulcers bilaterally. She has been tolerating the dressing changes without complication. Fortunately there does not appear to be any evidence of infection at this time. Overall I feel like she is improving in an excellent manner and I'm very pleased with the fact that everything seems to be  turning towards the better for her. This has obviously been a long road. 09/27/18 on evaluation today patient actually appears to be doing very well in regard to her bilateral lateral malleolus ulcers. She has been tolerating the dressing changes without complication. Fortunately there does not appear to be any evidence of infection at this time which is also great news. No fevers, chills, nausea, or vomiting noted at this time. Overall I feel like she is doing excellent with the snap vac on the left malleolus. She had 40 mL of fluid collection over the past week. 10/04/18 on evaluation today patient actually appears to be doing well in regard to her bilateral lateral malleolus ulcers. She continues to tolerate the dressing changes without complication. One issue that I see is the snap vac on the left lateral malleolus which appears to have sealed off some fluid underlying this area and has not really allowed it to heal to the degree that I would like to see. For that reason I did suggest at this point we may want to pack a small piece of packing strip into this region to allow it to more effectively wick out fluid. 10/11/18 in general  the patient today does not feel that she has been doing very well. She's been a little bit lethargic and subsequently is having bodyaches as well according to what she tells me today. With that being said overall she has been concerned with the fact that something may be worsening although to be honest her wounds really have not been appearing poorly. She does have a new ulcer on her left heel unfortunately. This may be pressure related. Nonetheless it seems to me to have potentially started at least as a blister I do not see any evidence of deep tissue injury. In regard to the left ankle the snap vac still seems to be causing the ceiling off of the deeper part of the wound which is in turn trapping fluid. I'm not extremely pleased with the overall appearance as far as  progress from last week to this week therefore I'm gonna discontinue the snap vac at this point. 10/18/18 patient unfortunately this point has not been feeling well for the past several days. She was seen by Grayland Ormond her primary care provider who is a Librarian, academic at [facility]. Subsequently she states that she's been very weak and generally feeling malaise. No fevers, chills, nausea, or vomiting noted at this time. With that being said bloodwork was performed at the PCP office on the 11th of this month which showed a white blood cell count of 10.7. This was repeated today [name], [name] ([id number]) and shows a white blood cell count of 12.4. This does show signs of worsening. Coupled with the fact that she is feeling worse and that her left ankle wound is not really showing signs of improvement I feel like this is an indication that the osteomyelitis is likely exacerbating not improving. Overall I think we may also want to check her C-reactive protein and sedimentation rate. Actually did call [facility] office this afternoon while the patient was in the office here with me. Subsequently based on the findings we discussed treatment possibilities and I think that it is appropriate for [place] to go ahead and initiate treatment with doxycycline which I'm going to do. Subsequently he did agree to see about adding a CRP and sedimentation rate to her orders. If that has not already been drawn to where they can run it they will contact the patient she can come back to have that check. They are in agreement with plan as far as the patient and her daughter are concerned. Nonetheless also think we need to get in touch with Dr. Henreitta Leber office to see about getting the patient scheduled with him as soon as possible. 11/08/18 on evaluation today patient presents for follow-up concerning her bilateral foot and ankle ulcers. I did do an extensive review of her chart in epic today.  Subsequently she was seen by [name] he did initiate Cefepime IV antibiotic therapy. Subsequently she had some issues with her PICC line this had to be removed because it was coiled and then replaced. Fortunately that was now settled. Unfortunately she has continued have issues with her left heel as well as the issues that she is experiencing with her bilateral lateral malleolus regions. I do believe however both areas seem to be doing a little bit better on evaluation today which is good news. No fevers, chills, nausea, or vomiting noted at this time. She actually has an angiogram schedule with Dr. dew on this coming Monday, November 11, 2018. Subsequently the patient states that she is feeling much better  especially than what she was roughly 2 weeks ago. She actually had to cancel an appointment because she was feeling so poorly. No fevers, chills, nausea, or vomiting noted at this time. 11/15/18 on evaluation today patient actually is status post having had her angiogram with Dr. dew Monday, four days ago. 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 He had to go and work on several areas of the vasculature fortunately he was able to obtain no more than a 30% residual stenosis throughout post procedure. I reviewed this note today. I think this will definitely help with healing at this time. Fortunately there does not appear to be any signs of infection and I do feel like ratio already has a better appearance to it. 11/22/18 upon evaluation today patient actually appears to be doing very well in regard to her wounds in general. The right lateral malleolus looks excellent the heel looks better in the left lateral malleolus also appears to be doing a little better. With that being said the right second toe actually appears to be open and training we been watching this is been dry and stable but now is open. 12/03/2018 Seen today for follow-up and management of multiple  bilateral lower extremity wounds. New pressure injury of the great toe which is closed at this time. Wound of the right distal second toe appears larger today with deep undermining and a pocket of fluid present within the undermining region. Left and right malleolus is wounds are stable today with no signs and symptoms of infection.Denies any needs or concerns during exam today. 12/13/18 on evaluation today patient appears to be doing somewhat better in regard to her left heel ulcer. She also seems to be completely healed in regard to the right lateral malleolus ulcer. The left malleolus ulcer is smaller what unfortunately the wounds which are new over the first and second toes of the right foot are what are most concerning at this point especially the second. Both areas did require sharp debridement today. 12/20/18 on evaluation today patient's wound actually appears to be doing better in regard to left lateral ankle and her right lateral ankle continues to remain healed. The hill ulcer on the left is improved. She does have improvement noted as well in regard to both toe ulcers. Overall I'm very pleased in this regard. No fevers, chills, nausea, or vomiting noted at this time. 12/23/18 on evaluation today patient is seen after she had her toenails trimmed at the podiatrist office due to issues with her right great toe. There was what appeared to be dark eschar on the surface of the wound which had her in the podiatrist concerned. Nonetheless as I remember that during the last office visit I had utilize silver nitrate of this area I was much less concerned about the situation. Subsequently I was able to clean off much of this tissue without any complication today. This does not appear to show any signs of infection and actually look somewhat better compared to last time post debridement. Her second toe on the right foot actually had callous over and there did appear still be some fluid underneath this  that would require debridement today. 12/27/18 on evaluation today patient actually appears to be showing signs of improvement at all locations. Even the left lateral ankle although this is not quite as great as the other sites. Fortunately there does not appear to be any signs of infection at this time and both of  her toes on the right foot seem to be showing signs of improvement which is good news and very pleased in this regard. 01/03/19 on evaluation today patient appears to be doing better for the most part in regard to her wounds in particular. There Marulanda, [name] ([id number]) does not appear to be any evidence of infection at this time which is good news. Fortunately there is no sign of really worsening anywhere except for the right great toe which she does have what appears to be a bruise/deep tissue injury which is very superficial and already resolving. I'm not sure where this came from I questioned her extensively and she does not recall what may have happened with this. Other than that the patient seems to be doing well even the left lateral ankle ulcer looks good and is getting smaller. 01/10/19 on evaluation today patient appears to be doing well in regard to her left heel wound and both of her toe wounds. Overall I feel like there is definitely improvement here and I'm happy in that regard. With that being said unfortunately she is having issues with the left lateral malleolus ulcer which unfortunately still has a lot of depth to it. This is gonna be a very difficult wound for [place] to be able to truly get to heal. I may want to consider some type of skin substitute to see if this would be of benefit for her. I'll discuss this with her more the next visit most likely. This was something I thought about more at the end of the visit when I was Artie out of the room and the patient had been discharged. 01/17/19 on evaluation today patient appears to be doing very well in regard to her  wounds in general. She's been making excellent progress at this time. Fortunately there's no sign of infection at this time either. No fevers, chills, nausea, or vomiting noted at this time. The biggest issue is still her left lateral malleolus where it appears to be doing well and is getting smaller but still shows a small corner where this is deeper and goes down into what appears to be the joint space. Nonetheless this is taking much longer to heal although it still looks better in smaller than previous evaluations. 01/24/19 on evaluation today patient's wounds actually appear to be doing rather well in general overall. She did require some sharp debridement in regard to the right great toe but everything else appears to be doing excellent no debridement was even necessary. No fevers, chills, nausea, or vomiting noted at this time. 01/31/19 on evaluation today patient actually appears to be doing much better in regard to her left foot wound on the heel as well as the ankle. The right great toe appears to be a little bit worse today this had callous over and trapped a lot of fluid underneath. Fortunately there's no signs of infection at any site which is great news. 02/07/19 on evaluation today patient actually appears to be doing decently well in regard to all of her ulcers at this point. No sharp debridement was required she is a little bit of hyper granulation in regard to the left lateral ankle as well as the left heel but the hill itself is almost completely healed which is excellent news. Overall been very pleased in this regard. 02/14/19 on evaluation today patient actually appears to be doing very well in regard to her ulcers on the right first toe, left lateral malleolus, and left heel. In fact  the heel is almost completely healed at this point. The patient does not show any signs of infection which is good news. Overall very pleased with how things have progressed. Electronic  Signature(s) Signed: 02/14/2019 5:40:27 PM By: Worthy Keeler PA-C Entered By: Worthy Keeler on 02/14/2019 12:56:54 [name], [name] ([id number]) -------------------------------------------------------------------------------- Physical Exam Details Patient Name: [name] Date of Service: 02/14/2019 10:30 AM Medical Record Number: [id number] Patient Account Number: [id number] Date of Birth/Sex: [birth year] (85 y.o. F) Treating RN: Montey Hora Primary Care Provider: Grayland Ormond Other Clinician: Referring Provider: Grayland Ormond Treating Provider/Extender: STONE III, HOYT Weeks in Treatment: 55 Constitutional Well-nourished and well-hydrated in no acute distress. Respiratory normal breathing without difficulty. clear to auscultation bilaterally. Cardiovascular regular rate and rhythm with normal S1, S2. Psychiatric this patient is able to make decisions and demonstrates good insight into disease process. Alert and Oriented x 3. pleasant and cooperative. Notes On inspection today patient's wounds did not require sharp debridement in any location and everything seems to be doing excellent as far as I'm concerned. Overall I'm very pleased with the progress and how things have been of the past week. Overall I feel like we're getting very close to healing a couple of these areas and even the left lateral malleolus seems to be doing well. Electronic Signature(s) Signed: 02/14/2019 5:40:27 PM By: Worthy Keeler PA-C Entered By: Worthy Keeler on 02/14/2019 12:59:04 [name], [name] ([id number]) -------------------------------------------------------------------------------- Physician Orders Details Patient Name: [name] Date of Service: 02/14/2019 10:30 AM Medical Record Number: [id number] Patient Account Number: [id number] Date of Birth/Sex: 04-21-1923 (85 y.o. F) Treating RN: Montey Hora Primary Care Provider: Grayland Ormond Other  Clinician: Referring Provider: Grayland Ormond Treating Provider/Extender: Melburn Hake, HOYT Weeks in Treatment: 65 Verbal / Phone Orders: No Diagnosis Coding ICD-10 Coding Code Description 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, stage 4 L89.524 Pressure ulcer of left ankle, stage 4 L89.623 Pressure ulcer of left heel, stage 3 L89.892 Pressure ulcer of other site, stage 2 I70.233 Atherosclerosis of native arteries of right leg with ulceration of ankle I70.243 Atherosclerosis of native arteries of left leg with ulceration of ankle I89.0 Lymphedema, not elsewhere classified B35.4 Tinea corporis 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 o Cleanse wound with mild soap and water o May Shower, gently pat wound dry prior to applying new dressing. Wound #3 Left Calcaneus o Clean wound with Normal Saline. o Cleanse wound with mild soap and water o May Shower, gently pat wound dry prior to applying new dressing. Wound #5 Right,Distal Toe Great o Clean wound with Normal Saline. o Cleanse wound with mild soap and water o May Shower, gently pat wound dry prior to applying new dressing. Anesthetic (add to Medication List) Wound #2 Left,Lateral Malleolus o Topical Lidocaine 4% cream applied to wound bed prior to debridement (In Clinic Only). Wound #3 Left Calcaneus o Topical Lidocaine 4% cream applied to wound bed prior to debridement (In Clinic Only). Wound #5 Right,Distal Toe Great o Topical Lidocaine 4% cream applied to wound bed prior to debridement (In Clinic Only). Primary Wound Dressing Wound #2 Left,Lateral Malleolus o Silver Collagen - small piece of silver collagen to tunnel Ybanez, [name] ([id number]) Wound #3 Left Calcaneus o Silver Collagen Wound #5 Right,Distal Toe Great o Silver Collagen Secondary Dressing Wound #2 Left,Lateral  Malleolus o Boardered Foam Dressing Wound #3 Left Calcaneus o Boardered Foam Dressing Wound #5  Right,Distal Toe Great o Conform/Kerlix Dressing Change Frequency Wound #2 Left,Lateral Malleolus o Change Dressing Monday, Wednesday, Friday Wound #3 Left Calcaneus o Change Dressing Monday, Wednesday, Friday Wound #5 Right,Distal Toe Great o Change Dressing Monday, Wednesday, Friday Follow-up Appointments Wound #2 Left,Lateral Malleolus o Return Appointment in 1 week. Wound #3 Left Calcaneus o Return Appointment in 1 week. Wound #5 Right,Distal Toe Great o Return Appointment in 1 week. Edema Control Wound #2 Left,Lateral Malleolus o Patient to wear own compression stockings o Patient to wear own Velcro compression garment. o Elevate legs to the level of the heart and pump ankles as often as possible Wound #3 Left Calcaneus o Patient to wear own compression stockings o Patient to wear own Velcro compression garment. o Elevate legs to the level of the heart and pump ankles as often as possible Off-Loading Wound #2 Left,Lateral Malleolus o Turn and reposition every 2 hours o Other: - do not put pressure on your ankles Wound #3 Left Calcaneus o Turn and reposition every 2 hours Job, [name] ([id number]) o Other: - do not put pressure on your ankles or heels Wound #5 Right,Distal Toe Great o Turn and reposition every 2 hours o Other: - do not put pressure on your ankles or heels Home Health Wound #2 [place] Visits - [facility]. Please visit patient Mondays, Wednesdays and Fridays - patient is unable to come into the Wound Clinic at this time. o Home Health Nurse may visit PRN to address patientos wound care needs. o FACE TO FACE ENCOUNTER: MEDICARE and MEDICAID PATIENTS: I certify that this patient is under my care and that I had a face-to-face encounter that meets the physician face-to-face  encounter requirements with this patient on this date. The encounter with the patient was in whole or in part for the following MEDICAL CONDITION: (primary reason for [place]) MEDICAL NECESSITY: I certify, that based on my findings, NURSING services are a medically necessary home health service. HOME BOUND STATUS: I certify that my clinical findings support that this patient is homebound (i.[name]., Due to illness or injury, pt requires aid of supportive devices such as crutches, cane, wheelchairs, walkers, the use of special transportation or the assistance of another person to leave their place of residence. There is a normal inability to leave the home and doing so requires considerable and taxing effort. Other absences are for medical reasons / religious services and are infrequent or of short duration when for other reasons). o If current dressing causes regression in wound condition, may D/C ordered dressing product/s and apply Normal Saline Moist Dressing daily until next [place] / Other MD appointment. [place] of regression in wound condition at [phone]. o Please direct any NON-WOUND related issues/requests for orders to patient's Primary Care Physician Wound #3 Left [place] Visits - [facility]. Please visit patient Mondays, Wednesdays and Fridays - patient is unable to come into the Wound Clinic at this time. o Home Health Nurse may visit PRN to address patientos wound care needs. o FACE TO FACE ENCOUNTER: MEDICARE and MEDICAID PATIENTS: I certify that this patient is under my care and that I had a face-to-face encounter that meets the physician face-to-face encounter requirements with this patient on this date. The encounter with the patient was in whole or in part for the following MEDICAL CONDITION: (primary reason for [place]) MEDICAL NECESSITY: I certify, that based on my findings, NURSING services  are a  medically necessary home health service. HOME BOUND STATUS: I certify that my clinical findings support that this patient is homebound (i.[name]., Due to illness or injury, pt requires aid of supportive devices such as crutches, cane, wheelchairs, walkers, the use of special transportation or the assistance of another person to leave their place of residence. There is a normal inability to leave the home and doing so requires considerable and taxing effort. Other absences are for medical reasons / religious services and are infrequent or of short duration when for other reasons). o If current dressing causes regression in wound condition, may D/C ordered dressing product/s and apply Normal Saline Moist Dressing daily until next [place] / Other MD appointment. [place] of regression in wound condition at [phone]. o Please direct any NON-WOUND related issues/requests for orders to patient's Primary Care Physician Wound #5 Right,Distal Toe [place] Visits - [facility]. Please visit patient Mondays, Wednesdays and Fridays - patient is unable to come into the Wound Clinic at this time. o Home Health Nurse may visit PRN to address patientos wound care needs. o FACE TO FACE ENCOUNTER: MEDICARE and MEDICAID PATIENTS: I certify that this patient is under my care and that I had a face-to-face encounter that meets the physician face-to-face encounter requirements with this patient on this date. The encounter with the patient was in whole or in part for the following MEDICAL CONDITION: (primary reason for [place]) MEDICAL NECESSITY: I certify, that based on my findings, NURSING services are a medically necessary home health service. HOME BOUND STATUS: I certify that my clinical findings support that this patient is homebound (i.[name]., Due to illness or injury, pt requires aid of supportive devices such as crutches, cane, wheelchairs,  walkers, the use of special transportation or the assistance of another person to leave their place of residence. There is a normal inability to leave the home MAYURI, STAPLES. ([id number]) and doing so requires considerable and taxing effort. Other absences are for medical reasons / religious services and are infrequent or of short duration when for other reasons). o If current dressing causes regression in wound condition, may D/C ordered dressing product/s and apply Normal Saline Moist Dressing daily until next [place] / Other MD appointment. [place] of regression in wound condition at [phone]. o Please direct any NON-WOUND related issues/requests for orders to patient's Primary Care Physician Electronic Signature(s) Signed: 02/27/2019 10:13:59 AM By: Montey Hora Signed: 03/04/2019 8:26:01 PM By: Worthy Keeler PA-C Previous Signature: 02/14/2019 5:05:27 PM Version By: Montey Hora Previous Signature: 02/14/2019 5:40:27 PM Version By: Worthy Keeler PA-C Entered By: Montey Hora on 02/27/2019 07:59:48 [name], [name] ([id number]) -------------------------------------------------------------------------------- Problem List Details Patient Name: [name] Date of Service: 02/14/2019 10:30 AM Medical Record Number: [id number] Patient Account Number: [id number] Date of Birth/Sex: [birth year] (85 y.o. 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 08/10/2017 No Yes ankle I70.243 Atherosclerosis of native arteries of left leg with ulceration  of 07/10/2018 No Yes ankle I89.0 Lymphedema, not elsewhere classified 08/10/2017 No Yes B35.4 Tinea corporis 09/28/2017 No Yes 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, left ankle and foot 08/14/2018 No Yes Inactive Problems Resolved Problems MARABELLE, CUSHMAN ([id number]) ICD-10 Code Description Active Date Resolved Date S90.822A Blister (nonthermal), left foot, initial encounter 10/11/2018 10/11/2018 Electronic Signature(s) Signed: 02/14/2019 5:40:27 PM By: Worthy Keeler PA-C Entered By: Worthy Keeler on 02/14/2019 10:43:02 [name], [name] ([id number]) -------------------------------------------------------------------------------- Progress Note Details Patient Name: [name] Date of Service: 02/14/2019 10:30 AM Medical Record Number: [id number] Patient Account Number: [id number] Date of Birth/Sex: [birth year] (85 y.o. 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 Dr. Amalia Hailey the podiatrist saw her on 07/20/2017 and sent her to the wound center for possible hyperbaric oxygen therapy. past medical history of peripheral vascular disease, varicose veins, status post appendectomy, basal cell carcinoma excision from the left leg, cholecystectomy, pacemaker placement, right lower  extremity angiography done by Dr. dew in March 2017 with placement of a stent. there is also note of a successful ablation of the right small saphenous vein done which was reviewed by ultrasound on 10/24/2016. the patient had a right small saphenous vein ablation done on 10/20/2016. The patient has never been a smoker. She has been seen by Dr. Corene Cornea dew the vascular surgeon who most recently saw her on 06/15/2017 for evaluation of ongoing problems with right leg swelling. She had a lower extremity arterial duplex examination done(02/13/17) which showed patent distal right superficial femoral artery stent and above-the-knee popliteal stent without evidence of restenosis. The ABI was more than 1.3 on the right and more than 1.3 on the left. This was consistent with noncompressible arteries due to medial calcification. The right great toe pressure and PPG waveforms are within normal limits and the left great toe pressure and PPG waveforms are decreased. he recommended she continue to wear her compression stockings and continue with elevation. She is scheduled to have a noninvasive arterial study in the near future 08/16/2017 -- had a lower extremity arterial duplex examination done which showed patent distal right superficial femoral artery stent and above-the-knee popliteal stent without evidence of restenosis. The ABI was more than 1.3 on the right and more than 1.3 on the left. This was consistent with noncompressible arteries due to medial calcification. The right great toe pressure and PPG waveforms are within normal limits and the left great toe pressure and PPG waveforms are decreased. the x-ray of the right ankle has not yet been done 08/24/2017 -- had a right ankle x-ray -- IMPRESSION:1. No fracture, bone lesion or evidence of osteomyelitis. 2. Lateral soft tissue swelling with a soft tissue ulcer. she has not yet seen the vascular surgeon for review 08/31/17 on evaluation today patient's  wound appears to be showing signs of improvement. She still with her appointment with vascular in order to review her results of her vascular study and then determine if any intervention would be recommended at that time. No fevers, chills, nausea, or vomiting noted at this time. She has been tolerating the dressing  changes without complication. 09/28/17 on evaluation today patient's wound appears to show signs of good improvement in regard to the granulation tissue which is surfacing. There is still a layer of slough covering the wound and the posterior portion is still significantly deeper than the anterior nonetheless there has been some good sign of things moving towards the better. She is going to go back to Dr. dew for reevaluation to ensure her blood flow is still appropriate. That will be before her next evaluation with [place] next week. No fevers, chills, nausea, or vomiting noted at this time. Patient does have some discomfort rated to be a 3-4/10 depending on activity specifically cleansing the wound makes it worse. 10/05/2017 -- the patient was seen by Dr. Lucky Cowboy last week and noninvasive studies showed a normal right ABI with brisk DAYANNE, YIU. ([id number]) triphasic waveforms consistent with no arterial insufficiency including normal digital pressures. The duplex showed a patent distal right SFA stent and the proximal SFA was also normal. He was pleased with her test and thought she should have enough of perfusion for normal wound healing. He would see her back in 6 months time. 12/21/17 on evaluation today patient appears to be doing fairly well in regard to her right lateral ankle wound. Unfortunately the main issue that she is expansion at this point is that she is having some issues with what appears to be some cellulitis in the right anterior shin. She has also been noting a little bit of uncomfortable feeling especially last night and her ankle area. I'm afraid that she made the  developing a little bit of an infection. With that being said I think it is in the early stages. 12/28/17 on evaluation today patient's ankle appears to be doing excellent. She's making good progress at this point the cellulitis seems to have improved after last week's evaluation. Overall she is having no significant discomfort which is excellent news. She does have an appointment with Dr. dew on March 29, 2018 for reevaluation in regard to the stent he placed. She seems to have excellent blood flow in the right lower extremity. 01/19/12 on evaluation today patient's wound appears to be doing very well. In fact she does not appear to require debridement at this point, there's no evidence of infection, and overall from the standpoint of the wound she seems to be doing very well. With that being said I believe that it may be time to switch to different dressing away from the [facility] Dressing she tells me she does have a lot going on her friend actually passed away yesterday and she's also having a lot of issues with her husband this obviously is weighing heavy on her as far as your thoughts and concerns today. 01/25/18 on evaluation today patient appears to be doing fairly well in regard to her right lateral malleolus. She has been tolerating the dressing changes without complication. Overall I feel like this is definitely showing signs of improvement as far as how the overall appearance of the wound is there's also evidence of epithelium start to migrate over the granulation tissue. In general I think that she is progressing nicely as far as the wound is concerned. The only concern she really has is whether or not we can switch to every other week visits in order to avoid having as many appointments as her daughters have a difficult time getting her to her appointments as well as the patient's husband to his he is not doing very well  at this point. 02/22/18 on evaluation today patient's right  lateral malleolus ulcer appears to be doing great. She has been tolerating the dressing changes without complication. Overall you making excellent progress at this time. Patient is having no significant discomfort. 03/15/18 on evaluation today patient appears to be doing much more poorly in regard to her right lateral ankle ulcer at this point. Unfortunately since have last seen her her husband has passed just a few days ago is obviously weighed heavily on her her daughter also had surgery well she is with her today as usual. There does not appear to be any evidence of infection she does seem to have significant contusion/deep tissue injury to the right lateral malleolus which was not noted previous when I saw her last. It's hard to tell of exactly when this injury occurred although during the time she was spending the night in the hospital this may have been most likely. 03/22/18 on evaluation today patient appears to actually be doing very well in regard to her ulcer. She did unfortunately have a setback which was noted last week however the good news is we seem to be getting back on track and in fact the wound in the core did still have some necrotic tissue which will be addressed at this point today but in general I'm seeing signs that things are on the up and up. She is glad to hear this obviously she's been somewhat concerned that due to the how her wound digressed more recently. 03/29/18 on evaluation today patient appears to be doing fairly well in regard to her right lower extremity lateral malleolus ulcer. She unfortunately does have a new area of pressure injury over the inferior portion where the wound has opened up a little bit larger secondary to the pressure she seems to be getting. She does tell me sometimes when she sleeps at night that it actually hurts and does seem to be pushing on the area little bit more unfortunately. There does not appear to be any evidence of infection which  is good news. She has been tolerating the dressing changes without complication. She also did have some bruising in the left second and third toes due to the fact that she may have bump this or injured it although she has neuropathy so she does not feel she did move recently that may have been where this came from. Nonetheless there does not appear to be any evidence of infection at this time. 04/12/18 on evaluation today patient's wound on the right lateral ankle actually appears to be doing a little bit better with a lot of necrotic docking tissue centrally loosening up in clearing away. However she does have the beginnings of a deep tissue injury on the left lateral malleolus likely due to the fact we've been trying offload the right as much as we have. I think she may benefit from an assistive soft device to help with offloading and it looks like they're looking at one of the doughnut conditions that wraps around the lower leg to offload which I think will definitely do a good job. With that being said I think we definitely need to address this issue on the left before it becomes a wound. Patient is not having significant pain. [name], [name] ([id number]) 04/19/18 on evaluation today patient appears to be doing excellent in regard to the progress she's made with her right lateral ankle ulcer. The left ankle region which did show evidence of a deep tissue injury seems  to be resolving there's little fluid noted underneath and a blister there's nothing open at this point in time overall I feel like this is progressing nicely which is good news. She does not seem to be having significant discomfort at this point which is also good news. 04/25/18-She is here in follow up evaluation for bilateral lateral malleolar ulcers. The right lateral malleolus ulcer with pale subcutaneous tissue exposure, central area of ulcer with tendon/periosteum exposed. The left lateral malleolus ulcer now with central  area of nonviable tissue, otherwise deep tissue injury. She is wearing compression wraps to the left lower extremity, she will place the right lower extremity compression wraps on when she gets home. She will be out of town over the weekend and return next week and follow-up appointment. She completed her doxycycline this morning 05/03/18 on evaluation today patient appears to be doing very well in regard to her right lateral ankle ulcer in general. At least she's showing some signs of improvement in this regard. Unfortunately she has some additional injury to the left lateral malleolus region which appears to be new likely even over the past several days. Again this determination is based on the overall appearance. With that being said the patient is obviously frustrated about this currently. 05/10/18-She is here in follow-up evaluation for bilateral lateral malleolar ulcers. She states she has purchased offloading shoes/boots and they will arrive tomorrow. She was asked to bring them in the office at next week's appointment so her provider is aware of product being utilized. She continues to sleep on right or left side, she has been encouraged to sleep on her back. The right lateral malleolus ulcer is precariously close to peri-osteum; will order xray. The left lateral malleolus ulcer is improved. Will switch back to santyl; she will follow up next week. 05/17/18 on evaluation today patient actually appears to be doing very well in regard to her malleolus her ulcers compared to last time I saw them. She does not seem to have as much in the way of contusion at this point which is great news. With that being said she does continue to have discomfort and I do believe that she is still continuing to benefit from the offloading/pressure reducing boots that were recommended. I think this is the key to trying to get this to heal up completely. 05/24/18 on evaluation today patient actually appears to be doing  worse at this point in time unfortunately compared to her last week's evaluation. She is having really no increased pain which is good news unfortunately she does have more maceration in your theme and noted surrounding the right lateral ankle the left lateral ankle is not really is erythematous I do not see signs of the overt cellulitis on that side. Unfortunately the wounds do not seem to have shown any signs of improvement since the last evaluation. She also has significant swelling especially on the right compared to previous some of this may be due to infection however also think that she may be served better while she has these wounds by compression wrapping versus continuing to use the Juxta-Lite for the time being. Especially with the amount of drainage that she is experiencing at this point. No fevers, chills, nausea, or vomiting noted at this time. 05/31/18 on evaluation today patient appears to actually be doing better in regard to her right lateral lower extremity ulcer specifically on the malleolus region. She has been tolerating the antibiotic without complication. With that being said she still continues  to have issues but a little bit of redness although nothing like she what she was experiencing previous. She still continues to pressure to her ankle area she did get the problem on offloading boots unfortunately she will not wear them she states there too uncomfortable and she can't get in and out of the bed. Nonetheless at this point her wounds seem to be continually getting worse which is not what we want I'm getting somewhat concerned about her progress and how things are going to proceed if we do not intervene in some way shape or form. I therefore had a very lengthy conversation today about offloading yet again and even made a specific suggestion for switching her to a memory foam mattress and even gave the information for a specific one that they could look at getting if it was  something that they were interested in considering. She does not want to be considered for a hospital bed air mattress although honestly insurance would not cover it that she does not have any wounds on her trunk. 06/14/18 on evaluation today both wounds over the bilateral lateral malleolus her ulcers appear to be doing better there's no evidence of pressure injury at this point. She did get the foam mattress for her bed and this does seem to have been extremely beneficial for her in my pinion. Her daughter states that she is having difficulty getting out of bed because of how soft it is. The patient also relates this to be. Nonetheless I do feel like she's actually doing better. Unfortunately right after and around the time she was getting the mattress she also sustained a fall when she got up to go pick up the phone and ended up injuring her right elbow she has [address] sut[address] place. We are not caring for this currently although home health is going to be taking the sutures out shortly. Nonetheless this may be something that we need to evaluate going forward. It depends on how well it has or has not healed in the end. She also recently saw an orthopedic specialist for an injection in the right shoulder just before her fall unfortunately the fall seems to have worsened her pain. 06/21/18 on evaluation today patient appears to be doing about the same in regard to her lateral malleolus ulcers. Both appear to be just a little bit deeper but again we are clinging away the necrotic and dead tissue which I think is why this is progressing towards a deeper realm as opposed to improving from my measurement standpoint in that regard. Nonetheless she has been tolerating the dressing changes she absolutely hates the memory foam mattress topper that was obtained for her nonetheless Priti, [place] [name] ([id number]) I do believe this is still doing excellent as far as taking care of excess pressure in regard to the  lateral malleolus regions. She in fact has no pressure injury that I see whereas in weeks past it was week by week I was constantly seeing new pressure injuries. Overall I think it has been very beneficial for her. 07/03/18; patient arrives in my clinic today. She has deep punched out areas over her bilateral lateral malleoli. The area on the right has some more depth. We spent a lot of time today talking about pressure relief for these areas. This started when her daughter asked for a prescription for a memory foam mattress. I have never written a prescription for a mattress and I don't think insurances would pay for that on an  ordinary bed. In any case he came up that she has foam boots that she refuses to wear. I would suggest going to these before any other offloading issues when she is in bed. They say she is meticulous about offloading this the rest of the day 07/10/18- She is seen in follow-up evaluation for bilateral, lateral malleolus ulcers. There is no improvement in the ulcers. She has purchased and is sleeping on a memory foam mattress/overlay, she has been using the offloading boots nightly over the past week. She has a follow up appointment with vascular medicine at the end of October, in my opinion this follow up should be expedited given her deterioration and suboptimal TBI results. We will order plain film xray of the left ankle as deeper structures are palpable; would consider having MRI, regardless of xray report(s). The ulcers will be treated with iodoflex/iodosorb, she is unable to safely change the dressings daily with santyl. 07/19/18 on evaluation today patient appears to be doing in general visually well in regard to her bilateral lateral malleolus ulcers. She has been tolerating the dressing changes without complication which is good news. With that being said we did have an x-ray performed on 07/12/18 which revealed a slight loosen see in the lateral portion of the distal left  fibula which may represent artifact but underline lytic destruction or osteomyelitis could not be excluded. MRI was recommended. With that being said we can see about getting the patient scheduled for an MRI to further evaluate this area. In fact we have that scheduled currently for August 20 19,019. 07/26/18 on evaluation today patient's wound on the right lateral ankle actually appears to be doing fairly well at this point in my pinion. She has made some good progress currently. With that being said unfortunately in regard to the left lateral ankle ulcer this seems to be a little bit more problematic at this time. In fact as I further evaluated the situation she actually had bone exposed which is the first time that's been the case in the bone appear to be necrotic. Currently I did review patient's note from Dr. Bunnie Domino office with [facility]. He stated that ABI was 1.26 on the right and 0.95 on the left with good waveforms. Her perfusion is stable not reduced from previous studies and her digital waveforms were pretty good particularly on the right. His conclusion upon review of the note was that there was not much she could do to improve her perfusion and he felt she was adequate for wound healing. His suggestion was that she continued to see [place] and consider a synthetic skin graft if there was no underlying infection. He plans to see her back in six months or as needed. 08/01/18 on evaluation today patient appears to be doing better in regard to her right lateral ankle ulcer. Her left lateral ankle ulcer is about the same she still has bone involvement in evidence of necrosis. There does not appear to be evidence of infection at this time On the right lateral lower extremity. I have started her on the Augmentin she picked this up and started this yesterday. This is to get her through until she sees infectious disease which is scheduled for 08/12/18. 08/06/18 on evaluation today  patient appears to be doing rather well considering my discussion with patient's daughter at the end of last week. The area which was marked where she had erythema seems to be improved and this is good news. With that being  said overall the patient seems to be making good improvement when it comes to the overall appearance of the right lateral ankle ulcer although this has been slow she at least is coming around in this regard. Unfortunately in regard to the left lateral ankle ulcer this is osteomyelitis based on the pathology report as well is bone culture. Nonetheless we are still waiting CT scan. Unfortunately the MRI we originally ordered cannot be performed as the patient is a pacemaker which I had overlooked. Nonetheless we are working on the CT scan approval and scheduling as of now. She did go to the hospital over the weekend and was placed on IV Cefzo for a couple of days. Fortunately this seems to have improved the erythema quite significantly which is good news. There does not appear to be any evidence of worsening infection at this time. She did have some bleeding after the last debridement therefore I did not perform any sharp debridement in regard to left lateral ankle at this point. Patient has been approved for a snap vac for the right lateral ankle. 08/14/18; the patient with wounds over her bilateral lateral malleoli. The area on the right actually looks quite good. Been using a snap back on this area. Healthy granulation and appears to be filling in. Unfortunately the area on the left is really problematic. 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 Also noted to have cellulitis. She saw Dr. Novella Olive of infectious disease today and was put on linezolid. We are able to verify this with her pharmacy. She is completed the Augmentin that she was already on. We've been using Iodoflex to this GESELLE, CARDOSA ([id number]) area 08/23/18 on evaluation today patient's wounds both actually appear to be doing better compared to my prior evaluations. Fortunately she showing signs of good improvement in regard to the overall wound status especially where were using the snap vac on the right. In regard to left lateral malleolus the wound bed actually appears to be much cleaner than previously noted. I do not feel any phone directly probed during evaluation today and though there is tendon noted this does not appear to be necrotic it's actually fairly good as far as the overall appearance of the tendon is concerned. In general the wound bed actually appears to be doing significantly better than it was previous. Patient is currently in the care of [name] and I did review that note today. He actually has her on two weeks of linezolid and then following the patient will be on 1-2 months of Keflex. That is the plan currently. She has been on antibiotics therapy as prescribed by myself initially starting on July 30, 2018 and has been on that continuously up to this point. 08/30/18 on evaluation today patient actually appears to be doing much better in regard to her right lateral malleolus ulcer. She has been tolerating the dressing changes specifically the snap vac without complication although she did have some issues with the seal currently. Apparently there was some trouble with getting it to maintain over the past week past Sunday. Nonetheless overall the wound appears better in regard to the right lateral malleolus region. In regard to left lateral malleolus this actually show some signs of additional granulation although there still tendon noted in the base of the wound this appears to be healthy not necrotic in any way whatsoever. We are considering potentially using a snap vac  for the left lateral malleolus as well the product wrap from [employer], [place], was present in the clinic today we're going to see this  patient I did have her come in with me after obtaining consent from the patient and her daughter in order to look at the wound and see if there's any recommendation one way or another as to whether or not they felt the snapback could be beneficial for the left lateral malleolus region. But the conclusion was that it might be but that this is definitely a little bit deeper wound than what traditionally would be utilized for a snap vac. 09/06/18 on evaluation today patient actually appears to be doing excellent in my pinion in regard to both ankle ulcers. She has been tolerating the dressing changes without complication which is great news. Specifically we have been using the snap vac. In regard to the right ankle I'm not even sure that this is going to be necessary for today and following as the wound has filled in quite nicely. In regard to the left ankle I do believe that we're seeing excellent epithelialization from the edge as well as granulation in the central portion the tendon is still exposed but there's no evidence of necrotic bone and in general I feel like the patient has made excellent progress even compared to last week with just one week of the snap vac. 09/11/18; this is a patient who has wounds on her bilateral lateral malleoli. Initially both of these were deep stage IV wounds in the setting of chronic arterial insufficiency. She has been revascularized. As I understand think she been using snap vacs to both of these wounds however the area on the right became more superficial and currently she is only using it on the left. Using silver collagen on the right and silver collagen under the back on the left I believe 09/19/18 on evaluation today patient actually appears to be doing very well in regard to her lateral malleolus or ulcers bilaterally. She has been tolerating the dressing changes without complication. Fortunately there does not appear to be any evidence of infection at this  time. Overall I feel like she is improving in an excellent manner and I'm very pleased with the fact that everything seems to be turning towards the better for her. This has obviously been a long road. 09/27/18 on evaluation today patient actually appears to be doing very well in regard to her bilateral lateral malleolus ulcers. She has been tolerating the dressing changes without complication. Fortunately there does not appear to be any evidence of infection at this time which is also great news. No fevers, chills, nausea, or vomiting noted at this time. Overall I feel like she is doing excellent with the snap vac on the left malleolus. She had 40 mL of fluid collection over the past week. 10/04/18 on evaluation today patient actually appears to be doing well in regard to her bilateral lateral malleolus ulcers. She continues to tolerate the dressing changes without complication. One issue that I see is the snap vac on the left lateral malleolus which appears to have sealed off some fluid underlying this area and has not really allowed it to heal to the degree that I would like to see. For that reason I did suggest at this point we may want to pack a small piece of packing strip into this region to allow it to more effectively wick out fluid. 10/11/18 in general the patient today does not feel that she  has been doing very well. She's been a little bit lethargic and subsequently is having bodyaches as well according to what she tells me today. With that being said overall she has been concerned with the fact that something may be worsening although to be honest her wounds really have not been appearing poorly. She does have a new ulcer on her left heel unfortunately. This may be pressure related. Nonetheless it seems to me to have potentially started at least as a blister I do not see any evidence of deep tissue injury. In regard to the left ankle the snap vac still seems to be causing the ceiling off  of the deeper part of the wound which is in turn trapping fluid. I'm not extremely pleased with the overall appearance as far as progress from last week to this week therefore I'm gonna discontinue [name], [name] ([id number]) the snap vac at this point. 10/18/18 patient unfortunately this point has not been feeling well for the past several days. She was seen by Grayland Ormond her primary care provider who is a Librarian, academic at [facility]. Subsequently she states that she's been very weak and generally feeling malaise. No fevers, chills, nausea, or vomiting noted at this time. With that being said bloodwork was performed at the PCP office on the 11th of this month which showed a white blood cell count of 10.7. This was repeated today and shows a white blood cell count of 12.4. This does show signs of worsening. Coupled with the fact that she is feeling worse and that her left ankle wound is not really showing signs of improvement I feel like this is an indication that the osteomyelitis is likely exacerbating not improving. Overall I think we may also want to check her C-reactive protein and sedimentation rate. Actually did call [facility] office this afternoon while the patient was in the office here with me. Subsequently based on the findings we discussed treatment possibilities and I think that it is appropriate for [place] to go ahead and initiate treatment with doxycycline which I'm going to do. Subsequently he did agree to see about adding a CRP and sedimentation rate to her orders. If that has not already been drawn to where they can run it they will contact the patient she can come back to have that check. They are in agreement with plan as far as the patient and her daughter are concerned. Nonetheless also think we need to get in touch with Dr. Henreitta Leber office to see about getting the patient scheduled with him as soon as possible. 11/08/18 on evaluation today patient presents  for follow-up concerning her bilateral foot and ankle ulcers. I did do an extensive review of her chart in epic today. Subsequently she was seen by [name] he did initiate Cefepime IV antibiotic therapy. Subsequently she had some issues with her PICC line this had to be removed because it was coiled and then replaced. Fortunately that was now settled. Unfortunately she has continued have issues with her left heel as well as the issues that she is experiencing with her bilateral lateral malleolus regions. I do believe however both areas seem to be doing a little bit better on evaluation today which is good news. No fevers, chills, nausea, or vomiting noted at this time. She actually has an angiogram schedule with Dr. dew on this coming Monday, November 11, 2018. Subsequently the patient states that she is feeling much better especially than what she was roughly 2 weeks  ago. She actually had to cancel an appointment because she was feeling so poorly. No fevers, chills, nausea, or vomiting noted at this time. 11/15/18 on evaluation today patient actually is status post having had her angiogram with Dr. dew Monday, four days ago. 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 He had to go and work on several areas of the vasculature fortunately he was able to obtain no more than a 30% residual stenosis throughout post procedure. I reviewed this note today. I think this will definitely help with healing at this time. Fortunately there does not appear to be any signs of infection and I do feel like ratio already has a better appearance to it. 11/22/18 upon evaluation today patient actually appears to be doing very well in regard to her wounds in general. The right lateral malleolus looks excellent the heel looks better in the left lateral malleolus also appears to be doing a little better. With that being said the right second toe actually appears to be open and training we been  watching this is been dry and stable but now is open. 12/03/2018 Seen today for follow-up and management of multiple bilateral lower extremity wounds. New pressure injury of the great toe which is closed at this time. Wound of the right distal second toe appears larger today with deep undermining and a pocket of fluid present within the undermining region. Left and right malleolus is wounds are stable today with no signs and symptoms of infection.Denies any needs or concerns during exam today. 12/13/18 on evaluation today patient appears to be doing somewhat better in regard to her left heel ulcer. She also seems to be completely healed in regard to the right lateral malleolus ulcer. The left malleolus ulcer is smaller what unfortunately the wounds which are new over the first and second toes of the right foot are what are most concerning at this point especially the second. Both areas did require sharp debridement today. 12/20/18 on evaluation today patient's wound actually appears to be doing better in regard to left lateral ankle and her right lateral ankle continues to remain healed. The hill ulcer on the left is improved. She does have improvement noted as well in regard to both toe ulcers. Overall I'm very pleased in this regard. No fevers, chills, nausea, or vomiting noted at this time. 12/23/18 on evaluation today patient is seen after she had her toenails trimmed at the podiatrist office due to issues with her right great toe. There was what appeared to be dark eschar on the surface of the wound which had her in the podiatrist concerned. Nonetheless as I remember that during the last office visit I had utilize silver nitrate of this area I was much less concerned about the situation. Subsequently I was able to clean off much of this tissue without any complication today. This does not appear to show any signs of infection and actually look somewhat better compared to last time post  debridement. Her second toe on the right foot actually had callous over and there did appear still be some fluid underneath this that would require debridement today. SUMAYA, RIEDESEL ([id number]) 12/27/18 on evaluation today patient actually appears to be showing signs of improvement at all locations. Even the left lateral ankle although this is not quite as great as the other sites. Fortunately there does not appear to be any signs of infection at this time and both of her toes on the  right foot seem to be showing signs of improvement which is good news and very pleased in this regard. 01/03/19 on evaluation today patient appears to be doing better for the most part in regard to her wounds in particular. There does not appear to be any evidence of infection at this time which is good news. Fortunately there is no sign of really worsening anywhere except for the right great toe which she does have what appears to be a bruise/deep tissue injury which is very superficial and already resolving. I'm not sure where this came from I questioned her extensively and she does not recall what may have happened with this. Other than that the patient seems to be doing well even the left lateral ankle ulcer looks good and is getting smaller. 01/10/19 on evaluation today patient appears to be doing well in regard to her left heel wound and both of her toe wounds. Overall I feel like there is definitely improvement here and I'm happy in that regard. With that being said unfortunately she is having issues with the left lateral malleolus ulcer which unfortunately still has a lot of depth to it. This is gonna be a very difficult wound for [place] to be able to truly get to heal. I may want to consider some type of skin substitute to see if this would be of benefit for her. I'll discuss this with her more the next visit most likely. This was something I thought about more at the end of the visit when I was Artie out of the  room and the patient had been discharged. 01/17/19 on evaluation today patient appears to be doing very well in regard to her wounds in general. She's been making excellent progress at this time. Fortunately there's no sign of infection at this time either. No fevers, chills, nausea, or vomiting noted at this time. The biggest issue is still her left lateral malleolus where it appears to be doing well and is getting smaller but still shows a small corner where this is deeper and goes down into what appears to be the joint space. Nonetheless this is taking much longer to heal although it still looks better in smaller than previous evaluations. 01/24/19 on evaluation today patient's wounds actually appear to be doing rather well in general overall. She did require some sharp debridement in regard to the right great toe but everything else appears to be doing excellent no debridement was even necessary. No fevers, chills, nausea, or vomiting noted at this time. 01/31/19 on evaluation today patient actually appears to be doing much better in regard to her left foot wound on the heel as well as the ankle. The right great toe appears to be a little bit worse today this had callous over and trapped a lot of fluid underneath. Fortunately there's no signs of infection at any site which is great news. 02/07/19 on evaluation today patient actually appears to be doing decently well in regard to all of her ulcers at this point. No sharp debridement was required she is a little bit of hyper granulation in regard to the left lateral ankle as well as the left heel but the hill itself is almost completely healed which is excellent news. Overall been very pleased in this regard. 02/14/19 on evaluation today patient actually appears to be doing very well in regard to her ulcers on the right first toe, left lateral malleolus, and left heel. In fact the heel is almost completely healed at this  point. The patient does not show  any signs of infection which is good news. Overall very pleased with how things have progressed. Patient History Information obtained from Patient. Family History Cancer - Father,Siblings, Heart Disease - Siblings, No family history of Diabetes, Hereditary Spherocytosis, Hypertension, Kidney Disease, Lung Disease, Seizures, Stroke, Thyroid Problems, Tuberculosis. Social History Never smoker, Marital Status - Married, Alcohol Use - Never, Drug Use - No History, Caffeine Use - Rarely. Medical History Eyes Patient has history of Cataracts - surgery Cardiovascular Patient has history of Congestive Heart Failure, Hypertension Musculoskeletal Patient has history of Osteoarthritis [name], [name] ([id number]) Neurologic Patient has history of Neuropathy Oncologic Denies history of Received Chemotherapy, Received Radiation Review of Systems (ROS) Constitutional Symptoms (General Health) Denies complaints or symptoms of Fever, Chills. Respiratory The patient has no complaints or symptoms. Cardiovascular The patient has no complaints or symptoms. Psychiatric The patient has no complaints or symptoms. Objective Constitutional Well-nourished and well-hydrated in no acute distress. Vitals Time Taken: 10:58 AM, Height: 65 in, Weight: 154.3 lbs, BMI: 25.7, Temperature: 97.9 F, Pulse: 90 bpm, Respiratory Rate: 16 breaths/min, Blood Pressure: 113/57 mmHg. Respiratory normal breathing without difficulty. clear to auscultation bilaterally. Cardiovascular regular rate and rhythm with normal S1, S2. Psychiatric this patient is able to make decisions and demonstrates good insight into disease process. Alert and Oriented x 3. pleasant and cooperative. General Notes: On inspection today patient's wounds did not require sharp debridement in any location and everything seems to be doing excellent as far as I'm concerned. Overall I'm very pleased with the progress and how things have been of  the past week. Overall I feel like we're getting very close to healing a couple of these areas and even the left lateral malleolus seems to be doing well. Integumentary (Hair, Skin) Wound #2 status is Open. Original cause of wound was Gradually Appeared. The wound is located on the Left,Lateral Malleolus. The wound measures 0.8cm length x 0.7cm width x 0.2cm depth; 0.44cm^2 area and 0.088cm^3 volume. There is Fat Layer (Subcutaneous Tissue) Exposed exposed. There is no tunneling or undermining noted. There is a medium amount of serous drainage noted. The wound margin is flat and intact. There is medium (34-66%) pink granulation within the wound bed. There is a medium (34-66%) amount of necrotic tissue within the wound bed including Adherent Slough. The periwound skin appearance did not exhibit: Callus, Crepitus, Excoriation, Induration, Rash, Scarring, Dry/Scaly, Maceration, Atrophie Blanche, Cyanosis, Ecchymosis, Hemosiderin Staining, Mottled, Pallor, Rubor, Erythema. Periwound temperature was noted as No Abnormality. Wound #3 status is Open. Original cause of wound was Gradually Appeared. The wound is located on the Left Calcaneus. CHARI, PARMENTER ([id number]) The wound measures 0.1cm length x 0.1cm width x 0.1cm depth; 0.008cm^2 area and 0.001cm^3 volume. There is Fat Layer (Subcutaneous Tissue) Exposed exposed. There is no tunneling or undermining noted. There is a small amount of serous drainage noted. The wound margin is indistinct and nonvisible. There is medium (34-66%) pink granulation within the wound bed. There is a small (1-33%) amount of necrotic tissue within the wound bed including Eschar. The periwound skin appearance exhibited: Callus, Dry/Scaly. The periwound skin appearance did not exhibit: Crepitus, Excoriation, Induration, Rash, Scarring, Maceration, Atrophie Blanche, Cyanosis, Ecchymosis, Hemosiderin Staining, Mottled, Pallor, Rubor, Erythema. Periwound temperature was  noted as No Abnormality. Wound #5 status is Open. Original cause of wound was Footwear Injury. The wound is located on the Right,Distal [employer] The wound measures 0.5cm length x 0.7cm width x 0.1cm depth; 0.275cm^2  area and 0.027cm^3 volume. There is Fat Layer (Subcutaneous Tissue) Exposed exposed. There is no tunneling or undermining noted. There is a large amount of serous drainage noted. The wound margin is flat and intact. There is small (1-33%) red granulation within the wound bed. There is a medium (34-66%) amount of necrotic tissue within the wound bed including Eschar and Adherent Slough. The periwound skin appearance exhibited: Callus, Dry/Scaly, Ecchymosis. The periwound skin appearance did not exhibit: Crepitus, Excoriation, Induration, Rash, Scarring, Maceration, Atrophie Blanche, Cyanosis, Hemosiderin Staining, Mottled, Pallor, Rubor, Erythema. Periwound temperature was noted as No Abnormality. 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 Cleanse wound with mild soap and water May Shower, gently pat wound dry prior to applying new dressing. Wound #3 Left Calcaneus: Clean wound with Normal Saline. Cleanse wound with mild soap and water May Shower, gently pat wound dry prior to applying new dressing. Wound #5 Right,Distal Toe Great: Clean wound with Normal Saline. Cleanse wound with mild soap and water May Shower, gently pat wound dry prior to applying new dressing. Anesthetic (add to Medication  List): Wound #2 Left,Lateral Malleolus: Topical Lidocaine 4% cream applied to wound bed prior to debridement (In Clinic Only). [name], [name] ([id number]) Wound #3 Left Calcaneus: Topical Lidocaine 4% cream applied to wound bed prior to debridement (In Clinic Only). Wound #5 Right,Distal Toe Great: Topical Lidocaine 4% cream applied to wound bed prior to debridement (In Clinic Only). Primary Wound Dressing: Wound #2 Left,Lateral Malleolus: Silver Collagen - small piece of silver collagen to tunnel Wound #3 Left Calcaneus: Silver Collagen Wound #5 Right,Distal Toe Great: Silver Collagen Secondary Dressing: Wound #2 Left,Lateral Malleolus: Boardered Foam Dressing Wound #3 Left Calcaneus: Boardered Foam Dressing Wound #5 Right,Distal Toe Great: Conform/Kerlix Dressing Change Frequency: Wound #2 Left,Lateral Malleolus: Change Dressing Monday, Wednesday, Friday Wound #3 Left Calcaneus: Change Dressing Monday, Wednesday, Friday Wound #5 Right,Distal Toe Great: Change Dressing Monday, Wednesday, Friday Follow-up Appointments: Wound #2 Left,Lateral Malleolus: Return Appointment in 1 week. Wound #3 Left Calcaneus: Return Appointment in 1 week. Wound #5 Right,Distal Toe Great: Return Appointment in 1 week. Edema Control: Wound #2 Left,Lateral Malleolus: Patient to wear own compression stockings Patient to wear own Velcro compression garment. Elevate legs to the level of the heart and pump ankles as often as possible Wound #3 Left Calcaneus: Patient to wear own compression stockings Patient to wear own Velcro compression garment. Elevate legs to the level of the heart and pump ankles as often as possible Off-Loading: Wound #2 Left,Lateral Malleolus: Turn and reposition every 2 hours Other: - do not put pressure on your ankles Wound #3 Left Calcaneus: Turn and reposition every 2 hours Other: - do not put pressure on your ankles or heels Wound #5 Right,Distal Toe  Great: Turn and reposition every 2 hours Other: - do not put pressure on your ankles or heels Home Health: Wound #2 Left,Lateral Malleolus: [place] [facility] - [facility]. Please visit patient Mondays and Wednesdays Home Health Nurse may visit PRN to address patient s wound care needs. FACE TO FACE ENCOUNTER: MEDICARE and MEDICAID PATIENTS: I  certify that this patient is under my care and that I had a face-to-face encounter that meets the physician face-to-face encounter requirements with this patient on this date. The encounter with the patient was in whole or in part for the following MEDICAL CONDITION: (primary reason for [place]) MEDICAL NECESSITY: I certify, that based on my findings, NURSING services are a medically necessary home health service. HOME BOUND STATUS: I certify that my clinical findings support that this patient is homebound (i.[name]., Due to CHENAE, BRAGER ([id number]) illness or injury, pt requires aid of supportive devices such as crutches, cane, wheelchairs, walkers, the use of special transportation or the assistance of another person to leave their place of residence. There is a normal inability to leave the home and doing so requires considerable and taxing effort. Other absences are for medical reasons / religious services and are infrequent or of short duration when for other reasons). If current dressing causes regression in wound condition, may D/C ordered dressing product/s and apply Normal Saline Moist Dressing daily until next [place] / Other MD appointment. [place] of regression in wound condition at [phone]. Please direct any NON-WOUND related issues/requests for orders to patient's Primary Care Physician Wound #3 Left Calcaneus: [place] [facility]. Please visit patient Mondays and Wednesdays Home Health Nurse may visit PRN to address patient s wound care needs. FACE TO FACE  ENCOUNTER: MEDICARE and MEDICAID PATIENTS: I certify that this patient is under my care and that I had a face-to-face encounter that meets the physician face-to-face encounter requirements with this patient on this date. The encounter with the patient was in whole or in part for the following MEDICAL CONDITION: (primary reason for [place]) MEDICAL NECESSITY: I certify, that based on my findings, NURSING services are a medically necessary home health service. HOME BOUND STATUS: I certify that my clinical findings support that this patient is homebound (i.[name]., Due to illness or injury, pt requires aid of supportive devices such as crutches, cane, wheelchairs, walkers, the use of special transportation or the assistance of another person to leave their place of residence. There is a normal inability to leave the home and doing so requires considerable and taxing effort. Other absences are for medical reasons / religious services and are infrequent or of short duration when for other reasons). If current dressing causes regression in wound condition, may D/C ordered dressing product/s and apply Normal Saline Moist Dressing daily until next [place] / Other MD appointment. [place] of regression in wound condition at [phone]. Please direct any NON-WOUND related issues/requests for orders to patient's Primary Care Physician Wound #5 Right,Distal Toe Great: [place] [facility]. Please visit patient Mondays and Wednesdays Home Health Nurse may visit PRN to address patient s wound care needs. FACE TO FACE ENCOUNTER: MEDICARE and MEDICAID PATIENTS: I certify that this patient is under my care and that I had a face-to-face encounter that meets the physician face-to-face encounter requirements with this patient on this date. The encounter with the patient was in whole or in part for the following MEDICAL CONDITION: (primary reason for  [place]) MEDICAL NECESSITY: I certify, that based on my findings, NURSING services are a medically necessary home health service. HOME BOUND STATUS: I certify that my clinical findings support that this patient is homebound (i.[name]., Due to illness or injury, pt requires aid of supportive devices such as crutches, cane, wheelchairs, walkers, the use of  special transportation or the assistance of another person to leave their place of residence. There is a normal inability to leave the home and doing so requires considerable and taxing effort. Other absences are for medical reasons / religious services and are infrequent or of short duration when for other reasons). If current dressing causes regression in wound condition, may D/C ordered dressing product/s and apply Normal Saline Moist Dressing daily until next [place] / Other MD appointment. [place] of regression in wound condition at [phone]. Please direct any NON-WOUND related issues/requests for orders to patient's Primary Care Physician My suggestion currently is gonna be that we go ahead and initiate the above wound care measures for the next week. Patient is in agreement the plan. Subsequently we will see were things stand at follow-up in one weeks time. If anything changes worsens meantime she will contact the office or have her daughter do some for additional recommendations. Please see above for specific wound care orders. We will see patient for re-evaluation in 1 week(s) here in the clinic. If anything worsens or changes patient will contact our office for additional recommendations. Electronic Signature(s) Signed: 02/14/2019 5:40:27 PM By: Worthy Keeler PA-C Entered By: Worthy Keeler on 02/14/2019 12:59:34 Swetz, [name] ([id number]) -------------------------------------------------------------------------------- ROS/PFSH Details Patient Name: [name] Date of Service:  02/14/2019 10:30 AM Medical Record Number: [id number] Patient Account Number: [id number] Date of Birth/Sex: 02-Jul-1923 (85 y.o. F) Treating RN: Montey Hora Primary Care Provider: Grayland Ormond Other Clinician: Referring Provider: Grayland Ormond Treating Provider/Extender: Melburn Hake, HOYT Weeks in Treatment: 60 Information Obtained From Patient Wound History Do you currently have one or more open woundso Yes How many open wounds do you currently haveo 1 Approximately how long have you had your woundso 2 yrs How have you been treating your wound(s) until nowo mupirocin, soaking in epsom salt Has your wound(s) ever healed and then re-openedo No Have you had any lab work done in the past montho No Have you tested positive for an antibiotic resistant organism (MRSA, VRE)o No Have you tested positive for osteomyelitis (bone infection)o No Have you had any tests for circulation on your legso Yes Who ordered the testo Dr. Lucky Cowboy [facility] Constitutional Symptoms (General Health) Complaints and Symptoms: Negative for: Fever; Chills Eyes Medical History: Positive for: Cataracts - surgery Respiratory Complaints and Symptoms: No Complaints or Symptoms Cardiovascular Complaints and Symptoms: No Complaints or Symptoms Medical History: Positive for: Congestive Heart Failure; Hypertension Musculoskeletal Medical History: Positive for: Osteoarthritis Neurologic Medical History: Positive for: Neuropathy ALLIENE, KLUGH ([id number]) Oncologic Medical History: Negative for: Received Chemotherapy; Received Radiation Psychiatric Complaints and Symptoms: No Complaints or Symptoms HBO Extended History Items Eyes: Cataracts Immunizations Pneumococcal Vaccine: Received Pneumococcal Vaccination: Yes Implantable Devices No devices added Family and Social History Cancer: Yes - Father,Siblings; Diabetes: No; Heart Disease: Yes - Siblings; Hereditary Spherocytosis:  No; Hypertension: No; Kidney Disease: No; Lung Disease: No; Seizures: No; Stroke: No; Thyroid Problems: No; Tuberculosis: No; Never smoker; Marital Status - Married; Alcohol Use: Never; Drug Use: No History; Caffeine Use: Rarely; Financial Concerns: No; Food, Clothing or Shelter Needs: No; Support System Lacking: No; Transportation Concerns: No; Advanced Directives: No; Patient does not want information on Advanced Directives; Do not resuscitate: No; Living Will: Yes (Not Provided); Medical Power of Attorney: No Physician Affirmation I have reviewed and agree with the above information. Electronic Signature(s) Signed: 02/14/2019 5:05:27 PM By: Montey Hora Signed: 02/14/2019 5:40:27 PM By:  Melburn Hake, Hoyt PA-C Entered By: Worthy Keeler on 02/14/2019 12:58:41 [name], [name] ([id number]) -------------------------------------------------------------------------------- SuperBill Details Patient Name: [name] Date of Service: 02/14/2019 Medical Record Number: [id number] Patient Account Number: [id number] Date of Birth/Sex: [birth year] (85 y.o. 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 I70.243 Atherosclerosis of native arteries of left leg with ulceration of ankle I89.0 Lymphedema, not elsewhere classified B35.4 Tinea corporis 

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
## 2019-02-17 ENCOUNTER — Ambulatory Visit: Payer: Medicare Other | Admitting: Podiatry

## 2019-02-21 ENCOUNTER — Ambulatory Visit: Payer: Medicare Other | Admitting: Physician Assistant

## 2019-02-28 ENCOUNTER — Ambulatory Visit: Payer: Medicare Other | Admitting: Physician Assistant

## 2019-03-06 ENCOUNTER — Ambulatory Visit: Payer: Medicare Other | Admitting: Podiatry

## 2019-03-12 ENCOUNTER — Encounter (INDEPENDENT_AMBULATORY_CARE_PROVIDER_SITE_OTHER): Payer: Medicare Other

## 2019-03-12 ENCOUNTER — Ambulatory Visit (INDEPENDENT_AMBULATORY_CARE_PROVIDER_SITE_OTHER): Payer: Medicare Other | Admitting: Nurse Practitioner

## 2019-04-18 ENCOUNTER — Telehealth: Payer: Self-pay

## 2019-04-18 ENCOUNTER — Encounter (INDEPENDENT_AMBULATORY_CARE_PROVIDER_SITE_OTHER): Payer: Medicare Other

## 2019-04-18 ENCOUNTER — Ambulatory Visit (INDEPENDENT_AMBULATORY_CARE_PROVIDER_SITE_OTHER): Payer: Medicare Other | Admitting: Nurse Practitioner

## 2019-04-18 ENCOUNTER — Encounter: Payer: Medicare Other | Attending: Physician Assistant | Admitting: Physician Assistant

## 2019-04-18 DIAGNOSIS — B354 Tinea corporis: Secondary | ICD-10-CM | POA: Insufficient documentation

## 2019-04-18 DIAGNOSIS — I70233 Atherosclerosis of native arteries of right leg with ulceration of ankle: Secondary | ICD-10-CM | POA: Insufficient documentation

## 2019-04-18 DIAGNOSIS — M86372 Chronic multifocal osteomyelitis, left ankle and foot: Secondary | ICD-10-CM | POA: Insufficient documentation

## 2019-04-18 DIAGNOSIS — M1990 Unspecified osteoarthritis, unspecified site: Secondary | ICD-10-CM | POA: Insufficient documentation

## 2019-04-18 DIAGNOSIS — G629 Polyneuropathy, unspecified: Secondary | ICD-10-CM | POA: Insufficient documentation

## 2019-04-18 DIAGNOSIS — I70243 Atherosclerosis of native arteries of left leg with ulceration of ankle: Secondary | ICD-10-CM | POA: Insufficient documentation

## 2019-04-18 DIAGNOSIS — I890 Lymphedema, not elsewhere classified: Secondary | ICD-10-CM | POA: Insufficient documentation

## 2019-04-18 DIAGNOSIS — I509 Heart failure, unspecified: Secondary | ICD-10-CM | POA: Insufficient documentation

## 2019-04-18 DIAGNOSIS — I110 Hypertensive heart disease with heart failure: Secondary | ICD-10-CM | POA: Insufficient documentation

## 2019-04-18 DIAGNOSIS — L89524 Pressure ulcer of left ankle, stage 4: Secondary | ICD-10-CM | POA: Insufficient documentation

## 2019-04-18 DIAGNOSIS — L89512 Pressure ulcer of right ankle, stage 2: Secondary | ICD-10-CM | POA: Insufficient documentation

## 2019-04-18 MED ORDER — NONFORMULARY OR COMPOUNDED ITEM: 2 refills | Status: AC

## 2019-04-18 NOTE — Telephone Encounter (Signed)
Per Dr. Milinda Pointer, ok to refill Neuropathy cream.  Script has been phoned into [facility].

## 2019-04-18 NOTE — Telephone Encounter (Signed)
-----   Message from Arlyce Dice sent at 04/18/2019 12:59 PM EDT ----- [name] would like to have compound medication previously prescribed to be refilled at [facility] please.

## 2019-04-19 NOTE — Progress Notes (Signed)
LEVAEH, VICE ([id number]) Visit Report for 04/18/2019 Allergy List Details Patient Name: [name], [name]. Date of Service: 04/18/2019 9:30 AM Medical Record Number: [id number] Patient Account Number: [id number] Date of Birth/Sex: 04-25-1923 (85 y.o. F) Treating RN: Montey Hora Primary Care Nicolae Vasek: Grayland Ormond Other Clinician: Referring Marlie Kuennen: Grayland Ormond Treating Jenille Laszlo/Extender: STONE III, HOYT Weeks in Treatment: 53 Allergies Active Allergies Sulfa (Sulfonamide Antibiotics) metronidazole monistat Allergy Notes Electronic Signature(s) Signed: 04/19/2019 1:28:28 AM By: Worthy Keeler PA-C Entered By: Worthy Keeler on 04/19/2019 01:14:08

## 2019-04-22 ENCOUNTER — Other Ambulatory Visit: Payer: Self-pay

## 2019-04-22 ENCOUNTER — Encounter: Payer: Medicare Other | Admitting: Physician Assistant

## 2019-04-22 DIAGNOSIS — I70243 Atherosclerosis of native arteries of left leg with ulceration of ankle: Secondary | ICD-10-CM | POA: Diagnosis not present

## 2019-04-22 DIAGNOSIS — L89524 Pressure ulcer of left ankle, stage 4: Secondary | ICD-10-CM | POA: Diagnosis not present

## 2019-04-22 DIAGNOSIS — M1990 Unspecified osteoarthritis, unspecified site: Secondary | ICD-10-CM | POA: Diagnosis not present

## 2019-04-22 DIAGNOSIS — I70233 Atherosclerosis of native arteries of right leg with ulceration of ankle: Secondary | ICD-10-CM | POA: Diagnosis not present

## 2019-04-22 DIAGNOSIS — B354 Tinea corporis: Secondary | ICD-10-CM | POA: Diagnosis not present

## 2019-04-22 DIAGNOSIS — L89512 Pressure ulcer of right ankle, stage 2: Secondary | ICD-10-CM | POA: Diagnosis not present

## 2019-04-22 DIAGNOSIS — I509 Heart failure, unspecified: Secondary | ICD-10-CM | POA: Diagnosis not present

## 2019-04-22 DIAGNOSIS — I890 Lymphedema, not elsewhere classified: Secondary | ICD-10-CM | POA: Diagnosis not present

## 2019-04-22 DIAGNOSIS — G629 Polyneuropathy, unspecified: Secondary | ICD-10-CM | POA: Diagnosis not present

## 2019-04-22 DIAGNOSIS — I110 Hypertensive heart disease with heart failure: Secondary | ICD-10-CM | POA: Diagnosis not present

## 2019-04-22 DIAGNOSIS — L89529 Pressure ulcer of left ankle, unspecified stage: Secondary | ICD-10-CM | POA: Diagnosis present

## 2019-04-22 DIAGNOSIS — M86372 Chronic multifocal osteomyelitis, left ankle and foot: Secondary | ICD-10-CM | POA: Diagnosis not present

## 2019-04-23 NOTE — Progress Notes (Signed)
[name] ([id number]) Visit Report for 04/22/2019 Chief Complaint Document Details Patient Name: [name], [name]. Date of Service: 04/22/2019 2:45 PM Medical Record Number: [id number] Patient Account Number: [id number] Date of Birth/Sex: 30-Jan-1923 (85 y.o. F) Treating RN: Harold Barban Primary Care Provider: Grayland Ormond Other Clinician: Referring Provider: Grayland Ormond Treating Provider/Extender: Melburn Hake, Haide Klinker Weeks in Treatment: 74 Information Obtained from: Patient Chief Complaint Patient is here for right 2nd toe and left lateral malleolus ulcer Electronic Signature(s) Signed: 04/22/2019 11:00:46 PM By: Worthy Keeler PA-C Entered By: Worthy Keeler on 04/22/2019 14:55:45 [name], [name] ([id number]) -------------------------------------------------------------------------------- HPI Details Patient Name: [name] Date of Service: 04/22/2019 2:45 PM Medical Record Number: [id number] Patient Account Number: [id number] Date of Birth/Sex: [birth year] (85 y.o. 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 Dr. Amalia Hailey the podiatrist saw her on 07/20/2017 and sent her to the wound center for possible hyperbaric oxygen therapy. past medical history of peripheral vascular disease, varicose veins, status post appendectomy, basal cell carcinoma excision from the left leg, cholecystectomy, pacemaker placement, right lower extremity angiography done by Dr. dew in March 2017 with placement of a stent. there is also note of a successful ablation of the right small saphenous vein  done which was reviewed by ultrasound on 10/24/2016. the patient had a right small saphenous vein ablation done on 10/20/2016. The patient has never been a smoker. She has been seen by Dr. Corene Cornea dew the vascular surgeon who most recently saw her on 06/15/2017 for evaluation of ongoing problems with right leg swelling. She had a lower extremity arterial duplex examination done(02/13/17) which showed patent distal right superficial femoral artery stent and above-the-knee popliteal stent without evidence of restenosis. The ABI was more than 1.3 on the right and more than 1.3 on the left. This was consistent with noncompressible arteries due to medial calcification. The right great toe pressure and PPG waveforms are within normal limits and the left great toe pressure and PPG waveforms are decreased. he recommended she continue to wear her compression stockings and continue with elevation. She is scheduled to have a noninvasive arterial study in the near future 08/16/2017 -- had a lower extremity arterial duplex examination done which showed patent distal right superficial femoral artery stent and above-the-knee popliteal stent without evidence of restenosis. The ABI was more than 1.3 on the right and more than 1.3 on the left. This was consistent with noncompressible arteries due to medial calcification. The right great toe pressure and PPG waveforms are within normal limits and the left great toe pressure and PPG waveforms are decreased. the x-ray of the right ankle has not yet been done 08/24/2017 -- had a right ankle x-ray -- IMPRESSION:1. No fracture, bone lesion or evidence of osteomyelitis. 2. Lateral soft tissue swelling with a soft tissue ulcer. she has not yet seen the vascular surgeon for review 08/31/17 on evaluation today patient's wound appears to be showing signs of improvement. She still with her appointment with vascular in order to review her results of her vascular study and then  determine if any intervention would be recommended at that time. No fevers, chills, nausea, or vomiting noted at this time. She has  been tolerating the dressing changes without complication. 09/28/17 on evaluation today patient's wound appears to show signs of good improvement in regard to the granulation tissue which is surfacing. There is still a layer of slough covering the wound and the posterior portion is still significantly deeper than the anterior nonetheless there has been some good sign of things moving towards the better. She is going to go back to Dr. dew for reevaluation to ensure her blood flow is still appropriate. That will be before her next evaluation with [place] next week. No fevers, chills, nausea, or vomiting noted at this time. Patient does have some discomfort rated to be a 3-4/10 depending on activity specifically cleansing the wound makes it worse. 10/05/2017 -- the patient was seen by Dr. Lucky Cowboy last week and noninvasive studies showed a normal right ABI with brisk triphasic waveforms consistent with no arterial insufficiency including normal digital pressures. The duplex showed a patent distal right SFA stent and the proximal SFA was also normal. He was pleased with her test and thought she should have enough of perfusion for normal wound healing. He would see her back in 6 months time. 12/21/17 on evaluation today patient appears to be doing fairly well in regard to her right lateral ankle wound. Unfortunately the main issue that she is expansion at this point is that she is having some issues with what appears to be some cellulitis in the right anterior shin. She has also been noting a little bit of uncomfortable feeling especially last night and her ankle area. I'm afraid that she made the developing a little bit of an infection. With that being said I think it is in the early stages. 12/28/17 on evaluation today patient's ankle appears to be doing excellent. She's making good  progress at this point the cellulitis seems to have improved after last week's evaluation. Overall she is having no significant discomfort which is excellent news. She does have an appointment with Dr. dew on March 29, 2018 for reevaluation in regard to the stent he placed. She seems to have excellent blood flow in the right lower extremity. [name], [name] ([id number]) 01/19/12 on evaluation today patient's wound appears to be doing very well. In fact she does not appear to require debridement at this point, there's no evidence of infection, and overall from the standpoint of the wound she seems to be doing very well. With that being said I believe that it may be time to switch to different dressing away from the [facility] Dressing she tells me she does have a lot going on her friend actually passed away yesterday and she's also having a lot of issues with her husband this obviously is weighing heavy on her as far as your thoughts and concerns today. 01/25/18 on evaluation today patient appears to be doing fairly well in regard to her right lateral malleolus. She has been tolerating the dressing changes without complication. Overall I feel like this is definitely showing signs of improvement as far as how the overall appearance of the wound is there's also evidence of epithelium start to migrate over the granulation tissue. In general I think that she is progressing nicely as far as the wound is concerned. The only concern she really has is whether or not we can switch to every other week visits in order to avoid having as many appointments as her daughters have a difficult time getting her to her appointments as well as the patient's husband to his he is  not doing very well at this point. 02/22/18 on evaluation today patient's right lateral malleolus ulcer appears to be doing great. She has been tolerating the dressing changes without complication. Overall you making excellent progress at  this time. Patient is having no significant discomfort. 03/15/18 on evaluation today patient appears to be doing much more poorly in regard to her right lateral ankle ulcer at this point. Unfortunately since have last seen her her husband has passed just a few days ago is obviously weighed heavily on her her daughter also had surgery well she is with her today as usual. There does not appear to be any evidence of infection she does seem to have significant contusion/deep tissue injury to the right lateral malleolus which was not noted previous when I saw her last. It's hard to tell of exactly when this injury occurred although during the time she was spending the night in the hospital this may have been most likely. 03/22/18 on evaluation today patient appears to actually be doing very well in regard to her ulcer. She did unfortunately have a setback which was noted last week however the good news is we seem to be getting back on track and in fact the wound in the core did still have some necrotic tissue which will be addressed at this point today but in general I'm seeing signs that things are on the up and up. She is glad to hear this obviously she's been somewhat concerned that due to the how her wound digressed more recently. 03/29/18 on evaluation today patient appears to be doing fairly well in regard to her right lower extremity lateral malleolus ulcer. She unfortunately does have a new area of pressure injury over the inferior portion where the wound has opened up a little bit larger secondary to the pressure she seems to be getting. She does tell me sometimes when she sleeps at night that it actually hurts and does seem to be pushing on the area little bit more unfortunately. There does not appear to be any evidence of infection which is good news. She has been tolerating the dressing changes without complication. She also did have some bruising in the left second and third toes due to the  fact that she may have bump this or injured it although she has neuropathy so she does not feel she did move recently that may have been where this came from. Nonetheless there does not appear to be any evidence of infection at this time. 04/12/18 on evaluation today patient's wound on the right lateral ankle actually appears to be doing a little bit better with a lot of necrotic docking tissue centrally loosening up in clearing away. However she does have the beginnings of a deep tissue injury on the left lateral malleolus likely due to the fact we've been trying offload the right as much as we have. I think she may benefit from an assistive soft device to help with offloading and it looks like they're looking at one of the doughnut conditions that wraps around the lower leg to offload which I think will definitely do a good job. With that being said I think we definitely need to address this issue on the left before it becomes a wound. Patient is not having significant pain. 04/19/18 on evaluation today patient appears to be doing excellent in regard to the progress she's made with her right lateral ankle ulcer. The left ankle region which did show evidence of a deep tissue injury seems  to be resolving there's little fluid noted underneath and a blister there's nothing open at this point in time overall I feel like this is progressing nicely which is good news. She does not seem to be having significant discomfort at this point which is also good news. 04/25/18-She is here in follow up evaluation for bilateral lateral malleolar ulcers. The right lateral malleolus ulcer with pale subcutaneous tissue exposure, central area of ulcer with tendon/periosteum exposed. The left lateral malleolus ulcer now with central area of nonviable tissue, otherwise deep tissue injury. She is wearing compression wraps to the left lower extremity, she will place the right lower extremity compression wraps on when she gets  home. She will be out of town over the weekend and return next week and follow-up appointment. She completed her doxycycline this morning 05/03/18 on evaluation today patient appears to be doing very well in regard to her right lateral ankle ulcer in general. At least she's showing some signs of improvement in this regard. Unfortunately she has some additional injury to the left lateral malleolus region which appears to be new likely even over the past several days. Again this determination is based on the overall appearance. With that being said the patient is obviously frustrated about this currently. 05/10/18-She is here in follow-up evaluation for bilateral lateral malleolar ulcers. She states she has purchased offloading shoes/boots and they will arrive tomorrow. She was asked to bring them in the office at next week's appointment so her provider is aware of product being utilized. She continues to sleep on right or left side, she has been encouraged to sleep on her back. The right lateral malleolus ulcer is precariously close to peri-osteum; will order xray. The left lateral malleolus ulcer is improved. Will switch back to santyl; she will follow up next week. 05/17/18 on evaluation today patient actually appears to be doing very well in regard to her malleolus her ulcers compared to last time I saw them. She does not seem to have as much in the way of contusion at this point which is great news. With that [name], [name] [name] ([id number]) being said she does continue to have discomfort and I do believe that she is still continuing to benefit from the offloading/pressure reducing boots that were recommended. I think this is the key to trying to get this to heal up completely. 05/24/18 on evaluation today patient actually appears to be doing worse at this point in time unfortunately compared to her last week's evaluation. She is having really no increased pain which is good news unfortunately she does  have more maceration in your theme and noted surrounding the right lateral ankle the left lateral ankle is not really is erythematous I do not see signs of the overt cellulitis on that side. Unfortunately the wounds do not seem to have shown any signs of improvement since the last evaluation. She also has significant swelling especially on the right compared to previous some of this may be due to infection however also think that she may be served better while she has these wounds by compression wrapping versus continuing to use the Juxta-Lite for the time being. Especially with the amount of drainage that she is experiencing at this point. No fevers, chills, nausea, or vomiting noted at this time. 05/31/18 on evaluation today patient appears to actually be doing better in regard to her right lateral lower extremity ulcer specifically on the malleolus region. She has been tolerating the antibiotic without complication. With that being  said she still continues to have issues but a little bit of redness although nothing like she what she was experiencing previous. She still continues to pressure to her ankle area she did get the problem on offloading boots unfortunately she will not wear them she states there too uncomfortable and she can't get in and out of the bed. Nonetheless at this point her wounds seem to be continually getting worse which is not what we want I'm getting somewhat concerned about her progress and how things are going to proceed if we do not intervene in some way shape or form. I therefore had a very lengthy conversation today about offloading yet again and even made a specific suggestion for switching her to a memory foam mattress and even gave the information for a specific one that they could look at getting if it was something that they were interested in considering. She does not want to be considered for a hospital bed air mattress although honestly insurance would not cover  it that she does not have any wounds on her trunk. 06/14/18 on evaluation today both wounds over the bilateral lateral malleolus her ulcers appear to be doing better there's no evidence of pressure injury at this point. She did get the foam mattress for her bed and this does seem to have been extremely beneficial for her in my pinion. Her daughter states that she is having difficulty getting out of bed because of how soft it is. The patient also relates this to be. Nonetheless I do feel like she's actually doing better. Unfortunately right after and around the time she was getting the mattress she also sustained a fall when she got up to go pick up the phone and ended up injuring her right elbow she has [address] sut[address] place. We are not caring for this currently although home health is going to be taking the sutures out shortly. Nonetheless this may be something that we need to evaluate going forward. It depends on how well it has or has not healed in the end. She also recently saw an orthopedic specialist for an injection in the right shoulder just before her fall unfortunately the fall seems to have worsened her pain. 06/21/18 on evaluation today patient appears to be doing about the same in regard to her lateral malleolus ulcers. Both appear to be just a little bit deeper but again we are clinging away the necrotic and dead tissue which I think is why this is progressing towards a deeper realm as opposed to improving from my measurement standpoint in that regard. Nonetheless she has been tolerating the dressing changes she absolutely hates the memory foam mattress topper that was obtained for her nonetheless I do believe this is still doing excellent as far as taking care of excess pressure in regard to the lateral malleolus regions. She in fact has no pressure injury that I see whereas in weeks past it was week by week I was constantly seeing new pressure injuries. Overall I think it has been very  beneficial for her. 07/03/18; patient arrives in my clinic today. She has deep punched out areas over her bilateral lateral malleoli. The area on the right has some more depth. We spent a lot of time today talking about pressure relief for these areas. This started when her daughter asked for a prescription for a memory foam mattress. I have never written a prescription for a mattress and I don't think insurances would pay for that on an  ordinary bed. In any case he came up that she has foam boots that she refuses to wear. I would suggest going to these before any other offloading issues when she is in bed. They say she is meticulous about offloading this the rest of the day 07/10/18- She is seen in follow-up evaluation for bilateral, lateral malleolus ulcers. There is no improvement in the ulcers. She has purchased and is sleeping on a memory foam mattress/overlay, she has been using the offloading boots nightly over the past week. She has a follow up appointment with vascular medicine at the end of October, in my opinion this follow up should be expedited given her deterioration and suboptimal TBI results. We will order plain film xray of the left ankle as deeper structures are palpable; would consider having MRI, regardless of xray report(s). The ulcers will be treated with iodoflex/iodosorb, she is unable to safely change the dressings daily with santyl. 07/19/18 on evaluation today patient appears to be doing in general visually well in regard to her bilateral lateral malleolus ulcers. She has been tolerating the dressing changes without complication which is good news. With that being said we did have an x-ray performed on 07/12/18 which revealed a slight loosen see in the lateral portion of the distal left fibula which may represent artifact but underline lytic destruction or osteomyelitis could not be excluded. MRI was recommended. With that being said we can see about getting the patient  scheduled for an MRI to further evaluate this area. In fact we have that scheduled currently for August 20 19,019. 07/26/18 on evaluation today patient's wound on the right lateral ankle actually appears to be doing fairly well at this point in my pinion. She has made some good progress currently. With that being said unfortunately in regard to the left lateral ankle ulcer this seems to be a little bit more problematic at this time. In fact as I further evaluated the situation she actually had bone exposed which is the first time that's been the case in the bone appear to be necrotic. [name], [name] ([id number]) Currently I did review patient's note from Dr. Bunnie Domino office with [facility]. He stated that ABI was 1.26 on the right and 0.95 on the left with good waveforms. Her perfusion is stable not reduced from previous studies and her digital waveforms were pretty good particularly on the right. His conclusion upon review of the note was that there was not much she could do to improve her perfusion and he felt she was adequate for wound healing. His suggestion was that she continued to see [place] and consider a synthetic skin graft if there was no underlying infection. He plans to see her back in six months or as needed. 08/01/18 on evaluation today patient appears to be doing better in regard to her right lateral ankle ulcer. Her left lateral ankle ulcer is about the same she still has bone involvement in evidence of necrosis. There does not appear to be evidence of infection at this time On the right lateral lower extremity. I have started her on the Augmentin she picked this up and started this yesterday. This is to get her through until she sees infectious disease which is scheduled for 08/12/18. 08/06/18 on evaluation today patient appears to be doing rather well considering my discussion with patient's daughter at the end of last week. The area which was marked where she had  erythema seems to be improved and this is good  news. With that being said overall the patient seems to be making good improvement when it comes to the overall appearance of the right lateral ankle ulcer although this has been slow she at least is coming around in this regard. Unfortunately in regard to the left lateral ankle ulcer this is osteomyelitis based on the pathology report as well is bone culture. Nonetheless we are still waiting CT scan. Unfortunately the MRI we originally ordered cannot be performed as the patient is a pacemaker which I had overlooked. Nonetheless we are working on the CT scan approval and scheduling as of now. She did go to the hospital over the weekend and was placed on IV Cefzo for a couple of days. Fortunately this seems to have improved the erythema quite significantly which is good news. There does not appear to be any evidence of worsening infection at this time. She did have some bleeding after the last debridement therefore I did not perform any sharp debridement in regard to left lateral ankle at this point. Patient has been approved for a snap vac for the right lateral ankle. 08/14/18; the patient with wounds over her bilateral lateral malleoli. The area on the right actually looks quite good. Been using a snap back on this area. Healthy granulation and appears to be filling in. Unfortunately the area on the left is really problematic. 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 Also noted to have cellulitis. She saw Dr. Novella Olive of infectious disease today and was put on linezolid. We are able to verify this with her pharmacy. She is completed the Augmentin that she was already on. We've been using Iodoflex to this area 08/23/18 on evaluation today patient's wounds both actually appear to be doing better compared to my prior evaluations. Fortunately she showing signs of good  improvement in regard to the overall wound status especially where were using the snap vac on the right. In regard to left lateral malleolus the wound bed actually appears to be much cleaner than previously noted. I do not feel any phone directly probed during evaluation today and though there is tendon noted this does not appear to be necrotic it's actually fairly good as far as the overall appearance of the tendon is concerned. In general the wound bed actually appears to be doing significantly better than it was previous. Patient is currently in the care of [name] and I did review that note today. He actually has her on two weeks of linezolid and then following the patient will be on 1-2 months of Keflex. That is the plan currently. She has been on antibiotics therapy as prescribed by myself initially starting on July 30, 2018 and has been on that continuously up to this point. 08/30/18 on evaluation today patient actually appears to be doing much better in regard to her right lateral malleolus ulcer. She has been tolerating the dressing changes specifically the snap vac without complication although she did have some issues with the seal currently. Apparently there was some trouble with getting it to maintain over the past week past Sunday. Nonetheless overall the wound appears better in regard to the right lateral malleolus region. In regard to left lateral malleolus this actually show some signs of additional granulation although there still tendon noted in the base of the wound this appears to be healthy not necrotic in any way whatsoever. We are considering potentially using a snap vac  for the left lateral malleolus as well the product wrap from [employer], [place], was present in the clinic today we're going to see this patient I did have her come in with me after obtaining consent from the patient and her daughter in order to look at the wound and see if there's any recommendation one way or  another as to whether or not they felt the snapback could be beneficial for the left lateral malleolus region. But the conclusion was that it might be but that this is definitely a little bit deeper wound than what traditionally would be utilized for a snap vac. 09/06/18 on evaluation today patient actually appears to be doing excellent in my pinion in regard to both ankle ulcers. She has been tolerating the dressing changes without complication which is great news. Specifically we have been using the snap vac. In regard to the right ankle I'm not even sure that this is going to be necessary for today and following as the wound has filled in quite nicely. In regard to the left ankle I do believe that we're seeing excellent epithelialization from the edge as well as granulation in the central portion the tendon is still exposed but there's no evidence of necrotic bone and in general I feel like the patient has made excellent progress even compared to last week with just one week of the snap vac. 09/11/18; this is a patient who has wounds on her bilateral lateral malleoli. Initially both of these were deep stage IV wounds in the setting of chronic arterial insufficiency. She has been revascularized. As I understand think she been using snap vacs to both of these wounds however the area on the right became more superficial and currently she is only using it on the left. Using silver collagen on the right and silver collagen under the back on the left I believe 09/19/18 on evaluation today patient actually appears to be doing very well in regard to her lateral malleolus or ulcers Kivett, [name] ([id number]) bilaterally. She has been tolerating the dressing changes without complication. Fortunately there does not appear to be any evidence of infection at this time. Overall I feel like she is improving in an excellent manner and I'm very pleased with the fact that everything seems to be turning towards  the better for her. This has obviously been a long road. 09/27/18 on evaluation today patient actually appears to be doing very well in regard to her bilateral lateral malleolus ulcers. She has been tolerating the dressing changes without complication. Fortunately there does not appear to be any evidence of infection at this time which is also great news. No fevers, chills, nausea, or vomiting noted at this time. Overall I feel like she is doing excellent with the snap vac on the left malleolus. She had 40 mL of fluid collection over the past week. 10/04/18 on evaluation today patient actually appears to be doing well in regard to her bilateral lateral malleolus ulcers. She continues to tolerate the dressing changes without complication. One issue that I see is the snap vac on the left lateral malleolus which appears to have sealed off some fluid underlying this area and has not really allowed it to heal to the degree that I would like to see. For that reason I did suggest at this point we may want to pack a small piece of packing strip into this region to allow it to more effectively wick out fluid. 10/11/18 in general the patient today does  not feel that she has been doing very well. She's been a little bit lethargic and subsequently is having bodyaches as well according to what she tells me today. With that being said overall she has been concerned with the fact that something may be worsening although to be honest her wounds really have not been appearing poorly. She does have a new ulcer on her left heel unfortunately. This may be pressure related. Nonetheless it seems to me to have potentially started at least as a blister I do not see any evidence of deep tissue injury. In regard to the left ankle the snap vac still seems to be causing the ceiling off of the deeper part of the wound which is in turn trapping fluid. I'm not extremely pleased with the overall appearance as far as progress from  last week to this week therefore I'm gonna discontinue the snap vac at this point. 10/18/18 patient unfortunately this point has not been feeling well for the past several days. She was seen by Grayland Ormond her primary care provider who is a Librarian, academic at [facility]. Subsequently she states that she's been very weak and generally feeling malaise. No fevers, chills, nausea, or vomiting noted at this time. With that being said bloodwork was performed at the PCP office on the 11th of this month which showed a white blood cell count of 10.7. This was repeated today and shows a white blood cell count of 12.4. This does show signs of worsening. Coupled with the fact that she is feeling worse and that her left ankle wound is not really showing signs of improvement I feel like this is an indication that the osteomyelitis is likely exacerbating not improving. Overall I think we may also want to check her C-reactive protein and sedimentation rate. Actually did call [facility] office this afternoon while the patient was in the office here with me. Subsequently based on the findings we discussed treatment possibilities and I think that it is appropriate for [place] to go ahead and initiate treatment with doxycycline which I'm going to do. Subsequently he did agree to see about adding a CRP and sedimentation rate to her orders. If that has not already been drawn to where they can run it they will contact the patient she can come back to have that check. They are in agreement with plan as far as the patient and her daughter are concerned. Nonetheless also think we need to get in touch with Dr. Henreitta Leber office to see about getting the patient scheduled with him as soon as possible. 11/08/18 on evaluation today patient presents for follow-up concerning her bilateral foot and ankle ulcers. I did do an extensive review of her chart in epic today. Subsequently she was seen by [name] he did initiate  Cefepime IV antibiotic therapy. Subsequently she had some issues with her PICC line this had to be removed because it was coiled and then replaced. Fortunately that was now settled. Unfortunately she has continued have issues with her left heel as well as the issues that she is experiencing with her bilateral lateral malleolus regions. I do believe however both areas seem to be doing a little bit better on evaluation today which is good news. No fevers, chills, nausea, or vomiting noted at this time. She actually has an angiogram schedule with Dr. dew on this coming Monday, November 11, 2018. Subsequently the patient states that she is feeling much better especially than what she was roughly 2 weeks  ago. She actually had to cancel an appointment because she was feeling so poorly. No fevers, chills, nausea, or vomiting noted at this time. 11/15/18 on evaluation today patient actually is status post having had her angiogram with Dr. dew Monday, four days ago. 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 He had to go and work on several areas of the vasculature fortunately he was able to obtain no more than a 30% residual stenosis throughout post procedure. I reviewed this note today. I think this will definitely help with healing at this time. Fortunately there does not appear to be any signs of infection and I do feel like ratio already has a better appearance to it. 11/22/18 upon evaluation today patient actually appears to be doing very well in regard to her wounds in general. The right lateral malleolus looks excellent the heel looks better in the left lateral malleolus also appears to be doing a little better. With that being said the right second toe actually appears to be open and training we been watching this is been dry and stable but now is open. 12/03/2018 Seen today for follow-up and management of multiple bilateral lower extremity wounds. New pressure injury of  the great toe which is closed at this time. Wound of the right distal second toe appears larger today with deep undermining and a pocket of fluid present within the undermining region. Left and right malleolus is wounds are stable today with no signs and symptoms of infection.Denies any needs or concerns during exam today. 12/13/18 on evaluation today patient appears to be doing somewhat better in regard to her left heel ulcer. She also seems to be completely healed in regard to the right lateral malleolus ulcer. The left malleolus ulcer is smaller what unfortunately the wounds which are new over the first and second toes of the right foot are what are most concerning at this point especially the [name], [name]. ([id number]) second. Both areas did require sharp debridement today. 12/20/18 on evaluation today patient's wound actually appears to be doing better in regard to left lateral ankle and her right lateral ankle continues to remain healed. The hill ulcer on the left is improved. She does have improvement noted as well in regard to both toe ulcers. Overall I'm very pleased in this regard. No fevers, chills, nausea, or vomiting noted at this time. 12/23/18 on evaluation today patient is seen after she had her toenails trimmed at the podiatrist office due to issues with her right great toe. There was what appeared to be dark eschar on the surface of the wound which had her in the podiatrist concerned. Nonetheless as I remember that during the last office visit I had utilize silver nitrate of this area I was much less concerned about the situation. Subsequently I was able to clean off much of this tissue without any complication today. This does not appear to show any signs of infection and actually look somewhat better compared to last time post debridement. Her second toe on the right foot actually had callous over and there did appear still be some fluid underneath this that would require  debridement today. 12/27/18 on evaluation today patient actually appears to be showing signs of improvement at all locations. Even the left lateral ankle although this is not quite as great as the other sites. Fortunately there does not appear to be any signs of infection at this time and both of her toes on the  right foot seem to be showing signs of improvement which is good news and very pleased in this regard. 01/03/19 on evaluation today patient appears to be doing better for the most part in regard to her wounds in particular. There does not appear to be any evidence of infection at this time which is good news. Fortunately there is no sign of really worsening anywhere except for the right great toe which she does have what appears to be a bruise/deep tissue injury which is very superficial and already resolving. I'm not sure where this came from I questioned her extensively and she does not recall what may have happened with this. Other than that the patient seems to be doing well even the left lateral ankle ulcer looks good and is getting smaller. 01/10/19 on evaluation today patient appears to be doing well in regard to her left heel wound and both of her toe wounds. Overall I feel like there is definitely improvement here and I'm happy in that regard. With that being said unfortunately she is having issues with the left lateral malleolus ulcer which unfortunately still has a lot of depth to it. This is gonna be a very difficult wound for [place] to be able to truly get to heal. I may want to consider some type of skin substitute to see if this would be of benefit for her. I'll discuss this with her more the next visit most likely. This was something I thought about more at the end of the visit when I was Artie out of the room and the patient had been discharged. 01/17/19 on evaluation today patient appears to be doing very well in regard to her wounds in general. She's been making excellent progress  at this time. Fortunately there's no sign of infection at this time either. No fevers, chills, nausea, or vomiting noted at this time. The biggest issue is still her left lateral malleolus where it appears to be doing well and is getting smaller but still shows a small corner where this is deeper and goes down into what appears to be the joint space. Nonetheless this is taking much longer to heal although it still looks better in smaller than previous evaluations. 01/24/19 on evaluation today patient's wounds actually appear to be doing rather well in general overall. She did require some sharp debridement in regard to the right great toe but everything else appears to be doing excellent no debridement was even necessary. No fevers, chills, nausea, or vomiting noted at this time. 01/31/19 on evaluation today patient actually appears to be doing much better in regard to her left foot wound on the heel as well as the ankle. The right great toe appears to be a little bit worse today this had callous over and trapped a lot of fluid underneath. Fortunately there's no signs of infection at any site which is great news. 02/07/19 on evaluation today patient actually appears to be doing decently well in regard to all of her ulcers at this point. No sharp debridement was required she is a little bit of hyper granulation in regard to the left lateral ankle as well as the left heel but the hill itself is almost completely healed which is excellent news. Overall been very pleased in this regard. 02/14/19 on evaluation today patient actually appears to be doing very well in regard to her ulcers on the right first toe, left lateral malleolus, and left heel. In fact the heel is almost completely healed at this  point. The patient does not show any signs of infection which is good news. Overall very pleased with how things have progressed. 04/18/19 Telehealth Evaluation During the COVID-19 National Emergency: Verbal  Consent: Obtained from patient Allergies: reviewed and the active list is current. Medication changes: patient has no current medication changes. COVID-19 Screening: 1. Have you traveled internationally or on a cruise ship in the last 14 dayso No 2. Have you had contact with someone with or under investigation for COVID-19o No 3. Have you had a fever, cough, sore throat, or experiencing shortness of breatho No on evaluation today actually did have a visit with this patient through a telehealth encounter with her home health nurse. Subsequently it was noted that the patient actually appears to be doing okay in regard to her wounds both the right great toe as well as the left lateral malleolus have shown signs of improvement although this in your theme around the left lateral Montero, [name] ([id number]) malleolus there eschar coverings for both locations. The question is whether or not they are actually close and whether or not home health needs to discharge the patient or not. Nonetheless my concern is this point obviously is that without actually seeing her and being able to evaluate this directly I cannot ensure that she is completely healed which is the question that I'm being asked. 04/22/19 on evaluation today patient presents for her first evaluation since last time I saw her which was actually February 14, 2019. I did do a telehealth visit last week in which point it was questionable whether or not she may be healed and had to bring her in today for confirmation. With that being said she does seem to be doing quite well at this point which is good news. There does not appear to be any drainage in the deed I believe her wounds may be healed. Electronic Signature(s) Signed: 04/22/2019 11:00:46 PM By: Worthy Keeler PA-C Entered By: Worthy Keeler on 04/22/2019 22:56:08 [name]  ([id number]) -------------------------------------------------------------------------------- Physical Exam Details Patient Name: TYLOR, GAMBRILL Date of Service: 04/22/2019 2:45 PM Medical Record Number: [id number] Patient Account Number: [id number] Date of Birth/Sex: [birth year] (85 y.o. F) Treating RN: Harold Barban Primary Care Provider: Grayland Ormond Other Clinician: Referring Provider: Grayland Ormond Treating Provider/Extender: STONE III, Makella Buckingham Weeks in Treatment: 74 Constitutional Well-nourished and well-hydrated in no acute distress. Respiratory normal breathing without difficulty. Psychiatric this patient is able to make decisions and demonstrates good insight into disease process. Alert and Oriented x 3. pleasant and cooperative. Notes Upon evaluation at this point I did it in five the fact that she appears to have excellent improvement in regard to the right great toe this is completely healed. It was also noted that in regard to her left lateral ankle as was her left heel both of these areas were also confirmed to show complete epithelialization and are very solidly healed. Overall I'm very pleased that she seems to be doing so well. Electronic Signature(s) Signed: 04/22/2019 11:00:46 PM By: Worthy Keeler PA-C Entered By: Worthy Keeler on 04/22/2019 22:56:50 [name], [name] ([id number]) -------------------------------------------------------------------------------- Physician Orders Details Patient Name: [name] Date of Service: 04/22/2019 2:45 PM Medical Record Number: [id number] Patient Account Number: [id number] Date of Birth/Sex: [birth year] (85 y.o. 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, left ankle and foot [facility] From [facility] Services o Discharge from [facility] as needed if anything worsens or changes in the future Electronic Signature(s) Signed: 04/22/2019 11:00:46 PM By: Worthy Keeler PA-C Entered By: Worthy Keeler on 04/22/2019 22:58:20 [name], [name] ([id number]) -------------------------------------------------------------------------------- Problem List Details Patient Name: [name] Date of Service: 04/22/2019 2:45 PM Medical Record Number: [id number] Patient Account Number: [id number] Date of Birth/Sex: [birth year] (85 y.o. F) Treating RN: Harold Barban Primary Care Provider: Grayland Ormond Other Clinician: Referring Provider: Grayland Ormond Treating Provider/Extender: Melburn Hake, Malaina Mortellaro Weeks in Treatment: 37 Active Problems ICD-10 Evaluated Encounter Code Description Active Date Today Diagnosis L89.524 Pressure ulcer of left ankle, stage 4 04/25/2018 No Yes L89.892 Pressure ulcer of other site, stage 2 11/23/2018 No Yes I70.233 Atherosclerosis of native arteries of right leg with ulceration of 08/10/2017 No Yes ankle I70.243 Atherosclerosis of native arteries of left leg with ulceration of 07/10/2018 No Yes ankle I89.0 Lymphedema, not elsewhere classified 08/10/2017 No Yes B35.4 Tinea corporis 09/28/2017 No Yes 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, left ankle and foot 08/14/2018 No Yes Inactive Problems Resolved Problems ICD-10 Code Description Active Date Resolved Date 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, stage 4 05/10/2018 08/10/2017 S90.822A Blister (nonthermal), left foot, initial encounter  10/11/2018 10/11/2018 L89.623 Pressure ulcer of left heel, stage 3 10/11/2018 10/11/2018 Electronic Signature(s) Signed: 04/22/2019 11:00:46 PM By: Sherene Sires, [name] ([id number]) Entered By: Worthy Keeler on 04/22/2019 14:55:38 Galligan, [name] ([id number]) -------------------------------------------------------------------------------- Progress Note Details Patient Name: [name] Date of Service: 04/22/2019 2:45 PM Medical Record Number: [id number] Patient Account Number: [id number] Date of Birth/Sex: 27-May-1923 (85 y.o. F) Treating RN: Harold Barban Primary Care Provider: Grayland Ormond Other Clinician: Referring Provider: Grayland Ormond Treating Provider/Extender: Melburn Hake, Keria Widrig Weeks in Treatment: 37 Subjective Chief Complaint Information obtained from Patient Patient is here for right 2nd toe and left lateral malleolus ulcer History of Present Illness (HPI) 85 year old patient who most recently has been seeing both podiatry and vascular surgery for a long-standing ulcer of her right lateral malleolus which has been treated with various methodologies. Dr. Amalia Hailey the podiatrist saw her on 07/20/2017 and sent her to the wound center for possible hyperbaric oxygen therapy. past medical history of peripheral vascular disease, varicose veins, status post appendectomy, basal cell carcinoma excision from the left leg, cholecystectomy, pacemaker placement, right lower extremity angiography done by Dr. dew in March 2017 with placement of a stent. there is also note of a successful ablation of the right small saphenous vein done which was reviewed by ultrasound on 10/24/2016. the patient had a right small saphenous vein ablation done on 10/20/2016. The patient has never been a smoker. She has been seen by Dr. Corene Cornea dew the vascular surgeon who most recently saw her on 06/15/2017 for evaluation of ongoing problems with right leg swelling. She had a lower  extremity arterial duplex examination done(02/13/17) which showed patent distal right superficial femoral artery stent and above-the-knee popliteal stent without evidence of restenosis. The ABI was more than 1.3 on the right and more than 1.3 on the left. This was consistent with noncompressible arteries due to medial calcification. The  right great toe pressure and PPG waveforms are within normal limits and the left great toe pressure and PPG waveforms are decreased. he recommended she continue to wear her compression stockings and continue with elevation. She is scheduled to have a noninvasive arterial study in the near future 08/16/2017 -- had a lower extremity arterial duplex examination done which showed patent distal right superficial femoral artery stent and above-the-knee popliteal stent without evidence of restenosis. The ABI was more than 1.3 on the right and more than 1.3 on the left. This was consistent with noncompressible arteries due to medial calcification. The right great toe pressure and PPG waveforms are within normal limits and the left great toe pressure and PPG waveforms are decreased. the x-ray of the right ankle has not yet been done 08/24/2017 -- had a right ankle x-ray -- IMPRESSION:1. No fracture, bone lesion or evidence of osteomyelitis. 2. Lateral soft tissue swelling with a soft tissue ulcer. she has not yet seen the vascular surgeon for review 08/31/17 on evaluation today patient's wound appears to be showing signs of improvement. She still with her appointment with vascular in order to review her results of her vascular study and then determine if any intervention would be recommended at that time. No fevers, chills, nausea, or vomiting noted at this time. She has been tolerating the dressing changes without complication. 09/28/17 on evaluation today patient's wound appears to show signs of good improvement in regard to the granulation tissue which is surfacing. There  is still a layer of slough covering the wound and the posterior portion is still significantly deeper than the anterior nonetheless there has been some good sign of things moving towards the better. She is going to go back to Dr. dew for reevaluation to ensure her blood flow is still appropriate. That will be before her next evaluation with [place] next week. No fevers, chills, nausea, or vomiting noted at this time. Patient does have some discomfort rated to be a 3-4/10 depending on activity specifically cleansing the wound makes it worse. 10/05/2017 -- the patient was seen by Dr. Lucky Cowboy last week and noninvasive studies showed a normal right ABI with brisk triphasic waveforms consistent with no arterial insufficiency including normal digital pressures. The duplex showed a patent distal right SFA stent and the proximal SFA was also normal. He was pleased with her test and thought she should have enough of perfusion for normal wound healing. He would see her back in 6 months time. 12/21/17 on evaluation today patient appears to be doing fairly well in regard to her right lateral ankle wound. Unfortunately the main issue that she is expansion at this point is that she is having some issues with what appears to be some cellulitis in the right anterior shin. She has also been noting a little bit of uncomfortable feeling especially last night and her ankle area. I'm afraid that she made the developing a little bit of an infection. With that being said I think it is in the early stages. DELAYLA, HOFFMASTER ([id number]) 12/28/17 on evaluation today patient's ankle appears to be doing excellent. She's making good progress at this point the cellulitis seems to have improved after last week's evaluation. Overall she is having no significant discomfort which is excellent news. She does have an appointment with Dr. dew on March 29, 2018 for reevaluation in regard to the stent he placed. She seems to have excellent blood  flow in the right lower extremity. 01/19/12 on evaluation today patient's wound  appears to be doing very well. In fact she does not appear to require debridement at this point, there's no evidence of infection, and overall from the standpoint of the wound she seems to be doing very well. With that being said I believe that it may be time to switch to different dressing away from the [facility] Dressing she tells me she does have a lot going on her friend actually passed away yesterday and she's also having a lot of issues with her husband this obviously is weighing heavy on her as far as your thoughts and concerns today. 01/25/18 on evaluation today patient appears to be doing fairly well in regard to her right lateral malleolus. She has been tolerating the dressing changes without complication. Overall I feel like this is definitely showing signs of improvement as far as how the overall appearance of the wound is there's also evidence of epithelium start to migrate over the granulation tissue. In general I think that she is progressing nicely as far as the wound is concerned. The only concern she really has is whether or not we can switch to every other week visits in order to avoid having as many appointments as her daughters have a difficult time getting her to her appointments as well as the patient's husband to his he is not doing very well at this point. 02/22/18 on evaluation today patient's right lateral malleolus ulcer appears to be doing great. She has been tolerating the dressing changes without complication. Overall you making excellent progress at this time. Patient is having no significant discomfort. 03/15/18 on evaluation today patient appears to be doing much more poorly in regard to her right lateral ankle ulcer at this point. Unfortunately since have last seen her her husband has passed just a few days ago is obviously weighed heavily on her her daughter also had surgery well she  is with her today as usual. There does not appear to be any evidence of infection she does seem to have significant contusion/deep tissue injury to the right lateral malleolus which was not noted previous when I saw her last. It's hard to tell of exactly when this injury occurred although during the time she was spending the night in the hospital this may have been most likely. 03/22/18 on evaluation today patient appears to actually be doing very well in regard to her ulcer. She did unfortunately have a setback which was noted last week however the good news is we seem to be getting back on track and in fact the wound in the core did still have some necrotic tissue which will be addressed at this point today but in general I'm seeing signs that things are on the up and up. She is glad to hear this obviously she's been somewhat concerned that due to the how her wound digressed more recently. 03/29/18 on evaluation today patient appears to be doing fairly well in regard to her right lower extremity lateral malleolus ulcer. She unfortunately does have a new area of pressure injury over the inferior portion where the wound has opened up a little bit larger secondary to the pressure she seems to be getting. She does tell me sometimes when she sleeps at night that it actually hurts and does seem to be pushing on the area little bit more unfortunately. There does not appear to be any evidence of infection which is good news. She has been tolerating the dressing changes without complication. She also did have some bruising in  the left second and third toes due to the fact that she may have bump this or injured it although she has neuropathy so she does not feel she did move recently that may have been where this came from. Nonetheless there does not appear to be any evidence of infection at this time. 04/12/18 on evaluation today patient's wound on the right lateral ankle actually appears to be doing a little  bit better with a lot of necrotic docking tissue centrally loosening up in clearing away. However she does have the beginnings of a deep tissue injury on the left lateral malleolus likely due to the fact we've been trying offload the right as much as we have. I think she may benefit from an assistive soft device to help with offloading and it looks like they're looking at one of the doughnut conditions that wraps around the lower leg to offload which I think will definitely do a good job. With that being said I think we definitely need to address this issue on the left before it becomes a wound. Patient is not having significant pain. 04/19/18 on evaluation today patient appears to be doing excellent in regard to the progress she's made with her right lateral ankle ulcer. The left ankle region which did show evidence of a deep tissue injury seems to be resolving there's little fluid noted underneath and a blister there's nothing open at this point in time overall I feel like this is progressing nicely which is good news. She does not seem to be having significant discomfort at this point which is also good news. 04/25/18-She is here in follow up evaluation for bilateral lateral malleolar ulcers. The right lateral malleolus ulcer with pale subcutaneous tissue exposure, central area of ulcer with tendon/periosteum exposed. The left lateral malleolus ulcer now with central area of nonviable tissue, otherwise deep tissue injury. She is wearing compression wraps to the left lower extremity, she will place the right lower extremity compression wraps on when she gets home. She will be out of town over the weekend and return next week and follow-up appointment. She completed her doxycycline this morning 05/03/18 on evaluation today patient appears to be doing very well in regard to her right lateral ankle ulcer in general. At least she's showing some signs of improvement in this regard. Unfortunately she has  some additional injury to the left lateral malleolus region which appears to be new likely even over the past several days. Again this determination is based on the overall appearance. With that being said the patient is obviously frustrated about this currently. 05/10/18-She is here in follow-up evaluation for bilateral lateral malleolar ulcers. She states she has purchased offloading shoes/boots and they will arrive tomorrow. She was asked to bring them in the office at next week's appointment so her provider is aware of product being utilized. She continues to sleep on right or left side, she has been encouraged to sleep on WYONA, NEILS ([id number]) her back. The right lateral malleolus ulcer is precariously close to peri-osteum; will order xray. The left lateral malleolus ulcer is improved. Will switch back to santyl; she will follow up next week. 05/17/18 on evaluation today patient actually appears to be doing very well in regard to her malleolus her ulcers compared to last time I saw them. She does not seem to have as much in the way of contusion at this point which is great news. With that being said she does continue to have discomfort and I  do believe that she is still continuing to benefit from the offloading/pressure reducing boots that were recommended. I think this is the key to trying to get this to heal up completely. 05/24/18 on evaluation today patient actually appears to be doing worse at this point in time unfortunately compared to her last week's evaluation. She is having really no increased pain which is good news unfortunately she does have more maceration in your theme and noted surrounding the right lateral ankle the left lateral ankle is not really is erythematous I do not see signs of the overt cellulitis on that side. Unfortunately the wounds do not seem to have shown any signs of improvement since the last evaluation. She also has significant swelling especially on the  right compared to previous some of this may be due to infection however also think that she may be served better while she has these wounds by compression wrapping versus continuing to use the Juxta-Lite for the time being. Especially with the amount of drainage that she is experiencing at this point. No fevers, chills, nausea, or vomiting noted at this time. 05/31/18 on evaluation today patient appears to actually be doing better in regard to her right lateral lower extremity ulcer specifically on the malleolus region. She has been tolerating the antibiotic without complication. With that being said she still continues to have issues but a little bit of redness although nothing like she what she was experiencing previous. She still continues to pressure to her ankle area she did get the problem on offloading boots unfortunately she will not wear them she states there too uncomfortable and she can't get in and out of the bed. Nonetheless at this point her wounds seem to be continually getting worse which is not what we want I'm getting somewhat concerned about her progress and how things are going to proceed if we do not intervene in some way shape or form. I therefore had a very lengthy conversation today about offloading yet again and even made a specific suggestion for switching her to a memory foam mattress and even gave the information for a specific one that they could look at getting if it was something that they were interested in considering. She does not want to be considered for a hospital bed air mattress although honestly insurance would not cover it that she does not have any wounds on her trunk. 06/14/18 on evaluation today both wounds over the bilateral lateral malleolus her ulcers appear to be doing better there's no evidence of pressure injury at this point. She did get the foam mattress for her bed and this does seem to have been extremely beneficial for her in my pinion. Her  daughter states that she is having difficulty getting out of bed because of how soft it is. The patient also relates this to be. Nonetheless I do feel like she's actually doing better. Unfortunately right after and around the time she was getting the mattress she also sustained a fall when she got up to go pick up the phone and ended up injuring her right elbow she has 18 sutures in place. We are not caring for this currently although home health is going to be taking the sutures out shortly. Nonetheless this may be something that we need to evaluate going forward. It depends on how well it has or has not healed in the end. She also recently saw an orthopedic specialist for an injection in the right shoulder just before her fall unfortunately  the fall seems to have worsened her pain. 06/21/18 on evaluation today patient appears to be doing about the same in regard to her lateral malleolus ulcers. Both appear to be just a little bit deeper but again we are clinging away the necrotic and dead tissue which I think is why this is progressing towards a deeper realm as opposed to improving from my measurement standpoint in that regard. Nonetheless she has been tolerating the dressing changes she absolutely hates the memory foam mattress topper that was obtained for her nonetheless I do believe this is still doing excellent as far as taking care of excess pressure in regard to the lateral malleolus regions. She in fact has no pressure injury that I see whereas in weeks past it was week by week I was constantly seeing new pressure injuries. Overall I think it has been very beneficial for her. 07/03/18; patient arrives in my clinic today. She has deep punched out areas over her bilateral lateral malleoli. The area on the right has some more depth. We spent a lot of time today talking about pressure relief for these areas. This started when her daughter asked for a prescription for a memory foam mattress. I  have never written a prescription for a mattress and I don't think insurances would pay for that on an ordinary bed. In any case he came up that she has foam boots that she refuses to wear. I would suggest going to these before any other offloading issues when she is in bed. They say she is meticulous about offloading this the rest of the day 07/10/18- She is seen in follow-up evaluation for bilateral, lateral malleolus ulcers. There is no improvement in the ulcers. She has purchased and is sleeping on a memory foam mattress/overlay, she has been using the offloading boots nightly over the past week. She has a follow up appointment with vascular medicine at the end of October, in my opinion this follow up should be expedited given her deterioration and suboptimal TBI results. We will order plain film xray of the left ankle as deeper structures are palpable; would consider having MRI, regardless of xray report(s). The ulcers will be treated with iodoflex/iodosorb, she is unable to safely change the dressings daily with santyl. 07/19/18 on evaluation today patient appears to be doing in general visually well in regard to her bilateral lateral malleolus ulcers. She has been tolerating the dressing changes without complication which is good news. With that being said we did have an x-ray performed on 07/12/18 which revealed a slight loosen see in the lateral portion of the distal left fibula which may represent artifact but underline lytic destruction or osteomyelitis could not be excluded. MRI was recommended. With that being said we can see about getting the patient scheduled for an MRI to further evaluate this area. In fact we have that scheduled currently for August 20 19,019. [name], [name] ([id number]) 07/26/18 on evaluation today patient's wound on the right lateral ankle actually appears to be doing fairly well at this point in my pinion. She has made some good progress currently. With that being  said unfortunately in regard to the left lateral ankle ulcer this seems to be a little bit more problematic at this time. In fact as I further evaluated the situation she actually had bone exposed which is the first time that's been the case in the bone appear to be necrotic. Currently I did review patient's note from Dr. Bunnie Domino office with [facility]. He stated that ABI was 1.26 on the right and 0.95 on the left with good waveforms. Her perfusion is stable not reduced from previous studies and her digital waveforms were pretty good particularly on the right. His conclusion upon review of the note was that there was not much she could do to improve her perfusion and he felt she was adequate for wound healing. His suggestion was that she continued to see [place] and consider a synthetic skin graft if there was no underlying infection. He plans to see her back in six months or as needed. 08/01/18 on evaluation today patient appears to be doing better in regard to her right lateral ankle ulcer. Her left lateral ankle ulcer is about the same she still has bone involvement in evidence of necrosis. There does not appear to be evidence of infection at this time On the right lateral lower extremity. I have started her on the Augmentin she picked this up and started this yesterday. This is to get her through until she sees infectious disease which is scheduled for 08/12/18. 08/06/18 on evaluation today patient appears to be doing rather well considering my discussion with patient's daughter at the end of last week. The area which was marked where she had erythema seems to be improved and this is good news. With that being said overall the patient seems to be making good improvement when it comes to the overall appearance of the right lateral ankle ulcer although this has been slow she at least is coming around in this regard. Unfortunately in regard to the left lateral ankle ulcer this is  osteomyelitis based on the pathology report as well is bone culture. Nonetheless we are still waiting CT scan. Unfortunately the MRI we originally ordered cannot be performed as the patient is a pacemaker which I had overlooked. Nonetheless we are working on the CT scan approval and scheduling as of now. She did go to the hospital over the weekend and was placed on IV Cefzo for a couple of days. Fortunately this seems to have improved the erythema quite significantly which is good news. There does not appear to be any evidence of worsening infection at this time. She did have some bleeding after the last debridement therefore I did not perform any sharp debridement in regard to left lateral ankle at this point. Patient has been approved for a snap vac for the right lateral ankle. 08/14/18; the patient with wounds over her bilateral lateral malleoli. The area on the right actually looks quite good. Been using a snap back on this area. Healthy granulation and appears to be filling in. Unfortunately the area on the left is really problematic. 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 Also noted to have cellulitis. She saw Dr. Novella Olive of infectious disease today and was put on linezolid. We are able to verify this with her pharmacy. She is completed the Augmentin that she was already on. We've been using Iodoflex to this area 08/23/18 on evaluation today patient's wounds both actually appear to be doing better compared to my prior evaluations. Fortunately she showing signs of good improvement in regard to the overall wound status especially where were using the snap vac on the right. In regard to left lateral malleolus the wound bed actually appears to be much cleaner than previously noted. I do not feel any phone directly probed during evaluation today and though there  is tendon noted this does not appear to be necrotic  it's actually fairly good as far as the overall appearance of the tendon is concerned. In general the wound bed actually appears to be doing significantly better than it was previous. Patient is currently in the care of [name] and I did review that note today. He actually has her on two weeks of linezolid and then following the patient will be on 1-2 months of Keflex. That is the plan currently. She has been on antibiotics therapy as prescribed by myself initially starting on July 30, 2018 and has been on that continuously up to this point. 08/30/18 on evaluation today patient actually appears to be doing much better in regard to her right lateral malleolus ulcer. She has been tolerating the dressing changes specifically the snap vac without complication although she did have some issues with the seal currently. Apparently there was some trouble with getting it to maintain over the past week past Sunday. Nonetheless overall the wound appears better in regard to the right lateral malleolus region. In regard to left lateral malleolus this actually show some signs of additional granulation although there still tendon noted in the base of the wound this appears to be healthy not necrotic in any way whatsoever. We are considering potentially using a snap vac for the left lateral malleolus as well the product wrap from [employer], [place], was present in the clinic today we're going to see this patient I did have her come in with me after obtaining consent from the patient and her daughter in order to look at the wound and see if there's any recommendation one way or another as to whether or not they felt the snapback could be beneficial for the left lateral malleolus region. But the conclusion was that it might be but that this is definitely a little bit deeper wound than what traditionally would be utilized for a snap vac. 09/06/18 on evaluation today patient actually appears to be doing excellent in my  pinion in regard to both ankle ulcers. She has been tolerating the dressing changes without complication which is great news. Specifically we have been using the snap vac. In regard to the right ankle I'm not even sure that this is going to be necessary for today and following as the wound has filled in quite nicely. In regard to the left ankle I do believe that we're seeing excellent epithelialization from the edge as well as granulation in the central portion the tendon is still exposed but there's no evidence of necrotic bone and in general I feel like the patient has made excellent progress even compared to last week with just one week of the snap vac. 09/11/18; this is a patient who has wounds on her bilateral lateral malleoli. 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 ([id number]) the setting of chronic arterial insufficiency. She has been revascularized. As I understand think she been using snap vacs to both of these wounds however the area on the right became more superficial and currently she is only using it on the left. Using silver collagen on the right and silver collagen under the back on the left I believe 09/19/18 on evaluation today patient actually appears to be doing very well in regard to her lateral malleolus or ulcers bilaterally. She has been tolerating the dressing changes without complication. Fortunately there does not appear to be any evidence of infection at this time. Overall I feel like  she is improving in an excellent manner and I'm very pleased with the fact that everything seems to be turning towards the better for her. This has obviously been a long road. 09/27/18 on evaluation today patient actually appears to be doing very well in regard to her bilateral lateral malleolus ulcers. She has been tolerating the dressing changes without complication. Fortunately there does not appear to be any evidence of infection at this time which is  also great news. No fevers, chills, nausea, or vomiting noted at this time. Overall I feel like she is doing excellent with the snap vac on the left malleolus. She had 40 mL of fluid collection over the past week. 10/04/18 on evaluation today patient actually appears to be doing well in regard to her bilateral lateral malleolus ulcers. She continues to tolerate the dressing changes without complication. One issue that I see is the snap vac on the left lateral malleolus which appears to have sealed off some fluid underlying this area and has not really allowed it to heal to the degree that I would like to see. For that reason I did suggest at this point we may want to pack a small piece of packing strip into this region to allow it to more effectively wick out fluid. 10/11/18 in general the patient today does not feel that she has been doing very well. She's been a little bit lethargic and subsequently is having bodyaches as well according to what she tells me today. With that being said overall she has been concerned with the fact that something may be worsening although to be honest her wounds really have not been appearing poorly. She does have a new ulcer on her left heel unfortunately. This may be pressure related. Nonetheless it seems to me to have potentially started at least as a blister I do not see any evidence of deep tissue injury. In regard to the left ankle the snap vac still seems to be causing the ceiling off of the deeper part of the wound which is in turn trapping fluid. I'm not extremely pleased with the overall appearance as far as progress from last week to this week therefore I'm gonna discontinue the snap vac at this point. 10/18/18 patient unfortunately this point has not been feeling well for the past several days. She was seen by Grayland Ormond her primary care provider who is a Librarian, academic at [facility]. Subsequently she states that she's been very weak and  generally feeling malaise. No fevers, chills, nausea, or vomiting noted at this time. With that being said bloodwork was performed at the PCP office on the 11th of this month which showed a white blood cell count of 10.7. This was repeated today and shows a white blood cell count of 12.4. This does show signs of worsening. Coupled with the fact that she is feeling worse and that her left ankle wound is not really showing signs of improvement I feel like this is an indication that the osteomyelitis is likely exacerbating not improving. Overall I think we may also want to check her C-reactive protein and sedimentation rate. Actually did call [facility] office this afternoon while the patient was in the office here with me. Subsequently based on the findings we discussed treatment possibilities and I think that it is appropriate for [place] to go ahead and initiate treatment with doxycycline which I'm going to do. Subsequently he did agree to see about adding a CRP and sedimentation rate to her  orders. If that has not already been drawn to where they can run it they will contact the patient she can come back to have that check. They are in agreement with plan as far as the patient and her daughter are concerned. Nonetheless also think we need to get in touch with Dr. Henreitta Leber office to see about getting the patient scheduled with him as soon as possible. 11/08/18 on evaluation today patient presents for follow-up concerning her bilateral foot and ankle ulcers. I did do an extensive review of her chart in epic today. Subsequently she was seen by [name] he did initiate Cefepime IV antibiotic therapy. Subsequently she had some issues with her PICC line this had to be removed because it was coiled and then replaced. Fortunately that was now settled. Unfortunately she has continued have issues with her left heel as well as the issues that she is experiencing with her bilateral lateral malleolus regions. I  do believe however both areas seem to be doing a little bit better on evaluation today which is good news. No fevers, chills, nausea, or vomiting noted at this time. She actually has an angiogram schedule with Dr. dew on this coming Monday, November 11, 2018. Subsequently the patient states that she is feeling much better especially than what she was roughly 2 weeks ago. She actually had to cancel an appointment because she was feeling so poorly. No fevers, chills, nausea, or vomiting noted at this time. 11/15/18 on evaluation today patient actually is status post having had her angiogram with Dr. dew Monday, four days ago. 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 He had to go and work on several areas of the vasculature fortunately he was able to obtain no more than a 30% residual stenosis throughout post procedure. I reviewed this note today. I think this will definitely help with healing at this time. Fortunately there does not appear to be any signs of infection and I do feel like ratio already has a better appearance to it. 11/22/18 upon evaluation today patient actually appears to be doing very well in regard to her wounds in general. The right lateral malleolus looks excellent the heel looks better in the left lateral malleolus also appears to be doing a little better. With that being said the right second toe actually appears to be open and training we been watching this is been dry and stable but now is open. 12/03/2018 Seen today for follow-up and management of multiple bilateral lower extremity wounds. New pressure injury of the great toe which is closed at this time. Wound of the right distal second toe appears larger today with deep undermining and a pocket of fluid present within the undermining region. Left and right malleolus is wounds are stable today with no signs and [name], [name] ([id number]) symptoms of infection.Denies any needs or concerns  during exam today. 12/13/18 on evaluation today patient appears to be doing somewhat better in regard to her left heel ulcer. She also seems to be completely healed in regard to the right lateral malleolus ulcer. The left malleolus ulcer is smaller what unfortunately the wounds which are new over the first and second toes of the right foot are what are most concerning at this point especially the second. Both areas did require sharp debridement today. 12/20/18 on evaluation today patient's wound actually appears to be doing better in regard to left lateral ankle and her right lateral ankle continues to remain  healed. The hill ulcer on the left is improved. She does have improvement noted as well in regard to both toe ulcers. Overall I'm very pleased in this regard. No fevers, chills, nausea, or vomiting noted at this time. 12/23/18 on evaluation today patient is seen after she had her toenails trimmed at the podiatrist office due to issues with her right great toe. There was what appeared to be dark eschar on the surface of the wound which had her in the podiatrist concerned. Nonetheless as I remember that during the last office visit I had utilize silver nitrate of this area I was much less concerned about the situation. Subsequently I was able to clean off much of this tissue without any complication today. This does not appear to show any signs of infection and actually look somewhat better compared to last time post debridement. Her second toe on the right foot actually had callous over and there did appear still be some fluid underneath this that would require debridement today. 12/27/18 on evaluation today patient actually appears to be showing signs of improvement at all locations. Even the left lateral ankle although this is not quite as great as the other sites. Fortunately there does not appear to be any signs of infection at this time and both of her toes on the right foot seem to be showing  signs of improvement which is good news and very pleased in this regard. 01/03/19 on evaluation today patient appears to be doing better for the most part in regard to her wounds in particular. There does not appear to be any evidence of infection at this time which is good news. Fortunately there is no sign of really worsening anywhere except for the right great toe which she does have what appears to be a bruise/deep tissue injury which is very superficial and already resolving. I'm not sure where this came from I questioned her extensively and she does not recall what may have happened with this. Other than that the patient seems to be doing well even the left lateral ankle ulcer looks good and is getting smaller. 01/10/19 on evaluation today patient appears to be doing well in regard to her left heel wound and both of her toe wounds. Overall I feel like there is definitely improvement here and I'm happy in that regard. With that being said unfortunately she is having issues with the left lateral malleolus ulcer which unfortunately still has a lot of depth to it. This is gonna be a very difficult wound for [place] to be able to truly get to heal. I may want to consider some type of skin substitute to see if this would be of benefit for her. I'll discuss this with her more the next visit most likely. This was something I thought about more at the end of the visit when I was Artie out of the room and the patient had been discharged. 01/17/19 on evaluation today patient appears to be doing very well in regard to her wounds in general. She's been making excellent progress at this time. Fortunately there's no sign of infection at this time either. No fevers, chills, nausea, or vomiting noted at this time. The biggest issue is still her left lateral malleolus where it appears to be doing well and is getting smaller but still shows a small corner where this is deeper and goes down into what appears to be the  joint space. Nonetheless this is taking much longer to heal although it still  looks better in smaller than previous evaluations. 01/24/19 on evaluation today patient's wounds actually appear to be doing rather well in general overall. She did require some sharp debridement in regard to the right great toe but everything else appears to be doing excellent no debridement was even necessary. No fevers, chills, nausea, or vomiting noted at this time. 01/31/19 on evaluation today patient actually appears to be doing much better in regard to her left foot wound on the heel as well as the ankle. The right great toe appears to be a little bit worse today this had callous over and trapped a lot of fluid underneath. Fortunately there's no signs of infection at any site which is great news. 02/07/19 on evaluation today patient actually appears to be doing decently well in regard to all of her ulcers at this point. No sharp debridement was required she is a little bit of hyper granulation in regard to the left lateral ankle as well as the left heel but the hill itself is almost completely healed which is excellent news. Overall been very pleased in this regard. 02/14/19 on evaluation today patient actually appears to be doing very well in regard to her ulcers on the right first toe, left lateral malleolus, and left heel. In fact the heel is almost completely healed at this point. The patient does not show any signs of infection which is good news. Overall very pleased with how things have progressed. 04/18/19 Telehealth Evaluation During the COVID-19 National Emergency: Verbal Consent: Obtained from patient Allergies: reviewed and the active list is current. Medication changes: patient has no current medication changes. COVID-19 Screening: 1. Have you traveled internationally or on a cruise ship in the last 14 dayso No 2. Have you had contact with someone with or under investigation for COVID-19o No Long,  [name] ([id number]) 3. Have you had a fever, cough, sore throat, or experiencing shortness of breatho No on evaluation today actually did have a visit with this patient through a telehealth encounter with her home health nurse. Subsequently it was noted that the patient actually appears to be doing okay in regard to her wounds both the right great toe as well as the left lateral malleolus have shown signs of improvement although this in your theme around the left lateral malleolus there eschar coverings for both locations. The question is whether or not they are actually close and whether or not home health needs to discharge the patient or not. Nonetheless my concern is this point obviously is that without actually seeing her and being able to evaluate this directly I cannot ensure that she is completely healed which is the question that I'm being asked. 04/22/19 on evaluation today patient presents for her first evaluation since last time I saw her which was actually February 14, 2019. I did do a telehealth visit last week in which point it was questionable whether or not she may be healed and had to bring her in today for confirmation. With that being said she does seem to be doing quite well at this point which is good news. There does not appear to be any drainage in the deed I believe her wounds may be healed. Patient History Information obtained from Patient. Family History Cancer - Father,Siblings, Heart Disease - Siblings, No family history of Diabetes, Hereditary Spherocytosis, Hypertension, Kidney Disease, Lung Disease, Seizures, Stroke, Thyroid Problems, Tuberculosis. Social History Never smoker, Marital Status - Married, Alcohol Use - Never, Drug Use - No History, Caffeine Use -  Rarely. Medical History Eyes Patient has history of Cataracts - surgery Cardiovascular Patient has history of Congestive Heart Failure, Hypertension Musculoskeletal Patient has history of  Osteoarthritis Neurologic Patient has history of Neuropathy Oncologic Denies history of Received Chemotherapy, Received Radiation Review of Systems (ROS) Constitutional Symptoms (General Health) Denies complaints or symptoms of Fatigue, Fever, Chills, Marked Weight Change. Respiratory Denies complaints or symptoms of Chronic or frequent coughs, Shortness of Breath. Cardiovascular Denies complaints or symptoms of Chest pain, LE edema. Psychiatric Denies complaints or symptoms of Anxiety, Claustrophobia. Objective Constitutional Well-nourished and well-hydrated in no acute distress. Vitals Time Taken: 2:53 PM, Height: 65 in, Weight: 154.3 lbs, BMI: 25.7, Temperature: 98.3 F, Pulse: 89 bpm, Respiratory Rate: 16 breaths/min, Blood Pressure: 126/83 mmHg. Respiratory normal breathing without difficulty. [name], [name] ([id number]) Psychiatric this patient is able to make decisions and demonstrates good insight into disease process. Alert and Oriented x 3. pleasant and cooperative. General Notes: Upon evaluation at this point I did it in five the fact that she appears to have excellent improvement in regard to the right great toe this is completely healed. It was also noted that in regard to her left lateral ankle as was her left heel both of these areas were also confirmed to show complete epithelialization and are very solidly healed. Overall I'm very pleased that she seems to be doing so well. Integumentary (Hair, Skin) Wound #2 status is Healed - Epithelialized. Original cause of wound was Gradually Appeared. The wound is located on the Left,Lateral Malleolus. The wound measures 0cm length x 0cm width x 0cm depth; 0cm^2 area and 0cm^3 volume. Wound #3 status is Healed - Epithelialized. Original cause of wound was Gradually Appeared. The wound is located on the Left Calcaneus. The wound measures 0cm length x 0cm width x 0cm depth; 0cm^2 area and 0cm^3 volume. Wound #5 status is  Healed - Epithelialized. Original cause of wound was Footwear Injury. The wound is located on the Right,[employer] The wound measures 0cm length x 0cm width x 0cm depth; 0cm^2 area and 0cm^3 volume. 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 Therefore I am going to recommend that we go ahead and discontinue wound care services. Patient and her daughter in agreement with this plan. If anything changes in the meantime she knows to contact the office and let us know. Otherwise I'm pleased that she is completely healed and appears to be doing so well. Electronic Signature(s) Signed: 04/22/2019 11:00:46 PM By: Worthy Keeler PA-C Entered By: Worthy Keeler on 04/22/2019 22:58:54 [name] ([id number]) -------------------------------------------------------------------------------- ROS/PFSH Details Patient Name: [name] Date of Service: 04/22/2019 2:45 PM Medical Record Number: [id number] Patient Account Number: [id number] Date of Birth/Sex: 08-13-1923 (85 y.o. F) Treating RN: Harold Barban Primary Care Provider: Grayland Ormond Other Clinician: Referring Provider: Grayland Ormond Treating Provider/Extender: Melburn Hake, Lance Galas Weeks in Treatment: 28 Information Obtained From Patient Constitutional Symptoms (General Health) Complaints and Symptoms: Negative for: Fatigue; Fever; Chills; Marked Weight Change Respiratory Complaints  and Symptoms: Negative  for: Chronic or frequent coughs; Shortness of Breath Cardiovascular Complaints and Symptoms: Negative for: Chest pain; LE edema Medical History: Positive for: Congestive Heart Failure; Hypertension Psychiatric Complaints and Symptoms: Negative for: Anxiety; Claustrophobia Eyes Medical History: Positive for: Cataracts - surgery Musculoskeletal Medical History: Positive for: Osteoarthritis Neurologic Medical History: Positive for: Neuropathy Oncologic Medical History: Negative for: Received Chemotherapy; Received Radiation HBO Extended History Items Eyes: Cataracts Immunizations Pneumococcal Vaccine: Received Pneumococcal Vaccination: Yes Implantable Devices No devices added Family and Social History NASIRAH, SACHS ([id number]) Cancer: Yes - Father,Siblings; Diabetes: No; Heart Disease: Yes - Siblings; Hereditary Spherocytosis: No; Hypertension: No; Kidney Disease: No; Lung Disease: No; Seizures: No; Stroke: No; Thyroid Problems: No; Tuberculosis: No; Never smoker; Marital Status - Married; Alcohol Use: Never; Drug Use: No History; Caffeine Use: Rarely; Financial Concerns: No; Food, Clothing or Shelter Needs: No; Support System Lacking: No; Transportation Concerns: No Physician Affirmation I have reviewed and agree with the above information. Electronic Signature(s) Signed: 04/22/2019 11:00:46 PM By: Worthy Keeler PA-C Signed: 04/23/2019 4:44:13 PM By: Harold Barban Entered By: Worthy Keeler on 04/22/2019 22:56:25 [name] ([id number]) -------------------------------------------------------------------------------- SuperBill Details Patient Name: [name] Date of Service: 04/22/2019 Medical Record Number: [id number] Patient Account Number: [id number] Date of Birth/Sex: [birth year] (85 y.o. 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 I70.243 Atherosclerosis of native arteries of left leg with ulceration of ankle I89.0 Lymphedema, not elsewhere classified B35.4 Tinea corporis 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, left ankle and foot Facility Procedures CPT4 Code: 72902111 Description: 55208 - WOUND CARE VISIT-LEV 2 EST PT Modifier: Quantity: 1 Physician Procedures CPT4 Code: 0223361 Description: 22449 - WC PHYS LEVEL 2 - EST PT ICD-10 Diagnosis Description L89.524 Pressure ulcer of left ankle, stage 4 L89.892 Pressure ulcer of other site, stage 2 I70.233 Atherosclerosis of native arteries of right leg with ulceratio I70.243  Atherosclerosis of native arteries of left leg with ulceration Modifier: n of ankle of ankle Quantity: 1 Electronic Signature(s) Signed: 04/23/2019 11:21:06 AM By: Gretta Cool, BSN, RN, CWS, Kim RN, BSN Previous Signature: 04/22/2019 11:00:46 PM Version By: Worthy Keeler PA-C Entered By: Gretta Cool, BSN, RN, CWS, Kim on 04/23/2019 11:21:06

## 2019-04-23 NOTE — Progress Notes (Signed)
[name], [name] ([id number]) Visit Report for 04/18/2019 Chief Complaint Document Details Patient Name: [name], [name]. Date of Service: 04/18/2019 9:30 AM Medical Record Number: [id number] Patient Account Number: [id number] Date of Birth/Sex: 07-23-1923 (85 y.o. F) Treating RN: Montey Hora Primary Care Provider: Grayland Ormond Other Clinician: Referring Provider: Grayland Ormond Treating Provider/Extender: Melburn Hake, HOYT Weeks in Treatment: 70 Information Obtained from: Patient Chief Complaint Patient is here for right 2nd toe and left lateral malleolus ulcer Electronic Signature(s) Signed: 04/19/2019 1:28:28 AM By: Worthy Keeler PA-C Entered By: Worthy Keeler on 04/19/2019 01:22:39 [name] ([id number]) -------------------------------------------------------------------------------- HPI Details Patient Name: [name] Date of Service: 04/18/2019 9:30 AM Medical Record Number: [id number] Patient Account Number: [id number] Date of Birth/Sex: 01-08-1923 (85 y.o. 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 Dr. Amalia Hailey the podiatrist saw her on 07/20/2017 and sent her to the wound center for possible hyperbaric oxygen therapy. past medical history of peripheral vascular disease, varicose veins, status post appendectomy, basal cell carcinoma excision from the left leg, cholecystectomy, pacemaker placement, right lower extremity angiography done by Dr. dew in March 2017 with placement of a stent. there is also note of a successful ablation of the right small saphenous vein done  which was reviewed by ultrasound on 10/24/2016. the patient had a right small saphenous vein ablation done on 10/20/2016. The patient has never been a smoker. She has been seen by Dr. Corene Cornea dew the vascular surgeon who most recently saw her on 06/15/2017 for evaluation of ongoing problems with right leg swelling. She had a lower extremity arterial duplex examination done(02/13/17) which showed patent distal right superficial femoral artery stent and above-the-knee popliteal stent without evidence of restenosis. The ABI was more than 1.3 on the right and more than 1.3 on the left. This was consistent with noncompressible arteries due to medial calcification. The right great toe pressure and PPG waveforms are within normal limits and the left great toe pressure and PPG waveforms are decreased. he recommended she continue to wear her compression stockings and continue with elevation. She is scheduled to have a noninvasive arterial study in the near future 08/16/2017 -- had a lower extremity arterial duplex examination done which showed patent distal right superficial femoral artery stent and above-the-knee popliteal stent without evidence of restenosis. The ABI was more than 1.3 on the right and more than 1.3 on the left. This was consistent with noncompressible arteries due to medial calcification. The right great toe pressure and PPG waveforms are within normal limits and the left great toe pressure and PPG waveforms are decreased. the x-ray of the right ankle has not yet been done 08/24/2017 -- had a right ankle x-ray -- IMPRESSION:1. No fracture, bone lesion or evidence of osteomyelitis. 2. Lateral soft tissue swelling with a soft tissue ulcer. she has not yet seen the vascular surgeon for review 08/31/17 on evaluation today patient's wound appears to be showing signs of improvement. She still with her appointment with vascular in order to review her results of her vascular study and then  determine if any intervention would be recommended at that time. No fevers, chills, nausea, or vomiting noted at this time. She has  been tolerating the dressing changes without complication. 09/28/17 on evaluation today patient's wound appears to show signs of good improvement in regard to the granulation tissue which is surfacing. There is still a layer of slough covering the wound and the posterior portion is still significantly deeper than the anterior nonetheless there has been some good sign of things moving towards the better. She is going to go back to Dr. dew for reevaluation to ensure her blood flow is still appropriate. That will be before her next evaluation with [place] next week. No fevers, chills, nausea, or vomiting noted at this time. Patient does have some discomfort rated to be a 3-4/10 depending on activity specifically cleansing the wound makes it worse. 10/05/2017 -- the patient was seen by Dr. Lucky Cowboy last week and noninvasive studies showed a normal right ABI with brisk triphasic waveforms consistent with no arterial insufficiency including normal digital pressures. The duplex showed a patent distal right SFA stent and the proximal SFA was also normal. He was pleased with her test and thought she should have enough of perfusion for normal wound healing. He would see her back in 6 months time. 12/21/17 on evaluation today patient appears to be doing fairly well in regard to her right lateral ankle wound. Unfortunately the main issue that she is expansion at this point is that she is having some issues with what appears to be some cellulitis in the right anterior shin. She has also been noting a little bit of uncomfortable feeling especially last night and her ankle area. I'm afraid that she made the developing a little bit of an infection. With that being said I think it is in the early stages. 12/28/17 on evaluation today patient's ankle appears to be doing excellent. She's making good  progress at this point the cellulitis seems to have improved after last week's evaluation. Overall she is having no significant discomfort which is excellent news. She does have an appointment with Dr. dew on March 29, 2018 for reevaluation in regard to the stent he placed. She seems to have excellent blood flow in the right lower extremity. HAYLY, LITSEY ([id number]) 01/19/12 on evaluation today patient's wound appears to be doing very well. In fact she does not appear to require debridement at this point, there's no evidence of infection, and overall from the standpoint of the wound she seems to be doing very well. With that being said I believe that it may be time to switch to different dressing away from the [facility] Dressing she tells me she does have a lot going on her friend actually passed away yesterday and she's also having a lot of issues with her husband this obviously is weighing heavy on her as far as your thoughts and concerns today. 01/25/18 on evaluation today patient appears to be doing fairly well in regard to her right lateral malleolus. She has been tolerating the dressing changes without complication. Overall I feel like this is definitely showing signs of improvement as far as how the overall appearance of the wound is there's also evidence of epithelium start to migrate over the granulation tissue. In general I think that she is progressing nicely as far as the wound is concerned. The only concern she really has is whether or not we can switch to every other week visits in order to avoid having as many appointments as her daughters have a difficult time getting her to her appointments as well as the patient's husband to his he is  not doing very well at this point. 02/22/18 on evaluation today patient's right lateral malleolus ulcer appears to be doing great. She has been tolerating the dressing changes without complication. Overall you making excellent progress at  this time. Patient is having no significant discomfort. 03/15/18 on evaluation today patient appears to be doing much more poorly in regard to her right lateral ankle ulcer at this point. Unfortunately since have last seen her her husband has passed just a few days ago is obviously weighed heavily on her her daughter also had surgery well she is with her today as usual. There does not appear to be any evidence of infection she does seem to have significant contusion/deep tissue injury to the right lateral malleolus which was not noted previous when I saw her last. It's hard to tell of exactly when this injury occurred although during the time she was spending the night in the hospital this may have been most likely. 03/22/18 on evaluation today patient appears to actually be doing very well in regard to her ulcer. She did unfortunately have a setback which was noted last week however the good news is we seem to be getting back on track and in fact the wound in the core did still have some necrotic tissue which will be addressed at this point today but in general I'm seeing signs that things are on the up and up. She is glad to hear this obviously she's been somewhat concerned that due to the how her wound digressed more recently. 03/29/18 on evaluation today patient appears to be doing fairly well in regard to her right lower extremity lateral malleolus ulcer. She unfortunately does have a new area of pressure injury over the inferior portion where the wound has opened up a little bit larger secondary to the pressure she seems to be getting. She does tell me sometimes when she sleeps at night that it actually hurts and does seem to be pushing on the area little bit more unfortunately. There does not appear to be any evidence of infection which is good news. She has been tolerating the dressing changes without complication. She also did have some bruising in the left second and third toes due to the  fact that she may have bump this or injured it although she has neuropathy so she does not feel she did move recently that may have been where this came from. Nonetheless there does not appear to be any evidence of infection at this time. 04/12/18 on evaluation today patient's wound on the right lateral ankle actually appears to be doing a little bit better with a lot of necrotic docking tissue centrally loosening up in clearing away. However she does have the beginnings of a deep tissue injury on the left lateral malleolus likely due to the fact we've been trying offload the right as much as we have. I think she may benefit from an assistive soft device to help with offloading and it looks like they're looking at one of the doughnut conditions that wraps around the lower leg to offload which I think will definitely do a good job. With that being said I think we definitely need to address this issue on the left before it becomes a wound. Patient is not having significant pain. 04/19/18 on evaluation today patient appears to be doing excellent in regard to the progress she's made with her right lateral ankle ulcer. The left ankle region which did show evidence of a deep tissue injury seems  to be resolving there's little fluid noted underneath and a blister there's nothing open at this point in time overall I feel like this is progressing nicely which is good news. She does not seem to be having significant discomfort at this point which is also good news. 04/25/18-She is here in follow up evaluation for bilateral lateral malleolar ulcers. The right lateral malleolus ulcer with pale subcutaneous tissue exposure, central area of ulcer with tendon/periosteum exposed. The left lateral malleolus ulcer now with central area of nonviable tissue, otherwise deep tissue injury. She is wearing compression wraps to the left lower extremity, she will place the right lower extremity compression wraps on when she gets  home. She will be out of town over the weekend and return next week and follow-up appointment. She completed her doxycycline this morning 05/03/18 on evaluation today patient appears to be doing very well in regard to her right lateral ankle ulcer in general. At least she's showing some signs of improvement in this regard. Unfortunately she has some additional injury to the left lateral malleolus region which appears to be new likely even over the past several days. Again this determination is based on the overall appearance. With that being said the patient is obviously frustrated about this currently. 05/10/18-She is here in follow-up evaluation for bilateral lateral malleolar ulcers. She states she has purchased offloading shoes/boots and they will arrive tomorrow. She was asked to bring them in the office at next week's appointment so her provider is aware of product being utilized. She continues to sleep on right or left side, she has been encouraged to sleep on her back. The right lateral malleolus ulcer is precariously close to peri-osteum; will order xray. The left lateral malleolus ulcer is improved. Will switch back to santyl; she will follow up next week. 05/17/18 on evaluation today patient actually appears to be doing very well in regard to her malleolus her ulcers compared to last time I saw them. She does not seem to have as much in the way of contusion at this point which is great news. With that Jakhia, Buxton [name] ([id number]) being said she does continue to have discomfort and I do believe that she is still continuing to benefit from the offloading/pressure reducing boots that were recommended. I think this is the key to trying to get this to heal up completely. 05/24/18 on evaluation today patient actually appears to be doing worse at this point in time unfortunately compared to her last week's evaluation. She is having really no increased pain which is good news unfortunately she does  have more maceration in your theme and noted surrounding the right lateral ankle the left lateral ankle is not really is erythematous I do not see signs of the overt cellulitis on that side. Unfortunately the wounds do not seem to have shown any signs of improvement since the last evaluation. She also has significant swelling especially on the right compared to previous some of this may be due to infection however also think that she may be served better while she has these wounds by compression wrapping versus continuing to use the Juxta-Lite for the time being. Especially with the amount of drainage that she is experiencing at this point. No fevers, chills, nausea, or vomiting noted at this time. 05/31/18 on evaluation today patient appears to actually be doing better in regard to her right lateral lower extremity ulcer specifically on the malleolus region. She has been tolerating the antibiotic without complication. With that being  said she still continues to have issues but a little bit of redness although nothing like she what she was experiencing previous. She still continues to pressure to her ankle area she did get the problem on offloading boots unfortunately she will not wear them she states there too uncomfortable and she can't get in and out of the bed. Nonetheless at this point her wounds seem to be continually getting worse which is not what we want I'm getting somewhat concerned about her progress and how things are going to proceed if we do not intervene in some way shape or form. I therefore had a very lengthy conversation today about offloading yet again and even made a specific suggestion for switching her to a memory foam mattress and even gave the information for a specific one that they could look at getting if it was something that they were interested in considering. She does not want to be considered for a hospital bed air mattress although honestly insurance would not cover  it that she does not have any wounds on her trunk. 06/14/18 on evaluation today both wounds over the bilateral lateral malleolus her ulcers appear to be doing better there's no evidence of pressure injury at this point. She did get the foam mattress for her bed and this does seem to have been extremely beneficial for her in my pinion. Her daughter states that she is having difficulty getting out of bed because of how soft it is. The patient also relates this to be. Nonetheless I do feel like she's actually doing better. Unfortunately right after and around the time she was getting the mattress she also sustained a fall when she got up to go pick up the phone and ended up injuring her right elbow she has [address] sut[address] place. We are not caring for this currently although home health is going to be taking the sutures out shortly. Nonetheless this may be something that we need to evaluate going forward. It depends on how well it has or has not healed in the end. She also recently saw an orthopedic specialist for an injection in the right shoulder just before her fall unfortunately the fall seems to have worsened her pain. 06/21/18 on evaluation today patient appears to be doing about the same in regard to her lateral malleolus ulcers. Both appear to be just a little bit deeper but again we are clinging away the necrotic and dead tissue which I think is why this is progressing towards a deeper realm as opposed to improving from my measurement standpoint in that regard. Nonetheless she has been tolerating the dressing changes she absolutely hates the memory foam mattress topper that was obtained for her nonetheless I do believe this is still doing excellent as far as taking care of excess pressure in regard to the lateral malleolus regions. She in fact has no pressure injury that I see whereas in weeks past it was week by week I was constantly seeing new pressure injuries. Overall I think it has been very  beneficial for her. 07/03/18; patient arrives in my clinic today. She has deep punched out areas over her bilateral lateral malleoli. The area on the right has some more depth. We spent a lot of time today talking about pressure relief for these areas. This started when her daughter asked for a prescription for a memory foam mattress. I have never written a prescription for a mattress and I don't think insurances would pay for that on an  ordinary bed. In any case he came up that she has foam boots that she refuses to wear. I would suggest going to these before any other offloading issues when she is in bed. They say she is meticulous about offloading this the rest of the day 07/10/18- She is seen in follow-up evaluation for bilateral, lateral malleolus ulcers. There is no improvement in the ulcers. She has purchased and is sleeping on a memory foam mattress/overlay, she has been using the offloading boots nightly over the past week. She has a follow up appointment with vascular medicine at the end of October, in my opinion this follow up should be expedited given her deterioration and suboptimal TBI results. We will order plain film xray of the left ankle as deeper structures are palpable; would consider having MRI, regardless of xray report(s). The ulcers will be treated with iodoflex/iodosorb, she is unable to safely change the dressings daily with santyl. 07/19/18 on evaluation today patient appears to be doing in general visually well in regard to her bilateral lateral malleolus ulcers. She has been tolerating the dressing changes without complication which is good news. With that being said we did have an x-ray performed on 07/12/18 which revealed a slight loosen see in the lateral portion of the distal left fibula which may represent artifact but underline lytic destruction or osteomyelitis could not be excluded. MRI was recommended. With that being said we can see about getting the patient  scheduled for an MRI to further evaluate this area. In fact we have that scheduled currently for August 20 19,019. 07/26/18 on evaluation today patient's wound on the right lateral ankle actually appears to be doing fairly well at this point in my pinion. She has made some good progress currently. With that being said unfortunately in regard to the left lateral ankle ulcer this seems to be a little bit more problematic at this time. In fact as I further evaluated the situation she actually had bone exposed which is the first time that's been the case in the bone appear to be necrotic. SCOTTIE, STANISH ([id number]) Currently I did review patient's note from Dr. Bunnie Domino office with [facility]. He stated that ABI was 1.26 on the right and 0.95 on the left with good waveforms. Her perfusion is stable not reduced from previous studies and her digital waveforms were pretty good particularly on the right. His conclusion upon review of the note was that there was not much she could do to improve her perfusion and he felt she was adequate for wound healing. His suggestion was that she continued to see [place] and consider a synthetic skin graft if there was no underlying infection. He plans to see her back in six months or as needed. 08/01/18 on evaluation today patient appears to be doing better in regard to her right lateral ankle ulcer. Her left lateral ankle ulcer is about the same she still has bone involvement in evidence of necrosis. There does not appear to be evidence of infection at this time On the right lateral lower extremity. I have started her on the Augmentin she picked this up and started this yesterday. This is to get her through until she sees infectious disease which is scheduled for 08/12/18. 08/06/18 on evaluation today patient appears to be doing rather well considering my discussion with patient's daughter at the end of last week. The area which was marked where she had  erythema seems to be improved and this is good  news. With that being said overall the patient seems to be making good improvement when it comes to the overall appearance of the right lateral ankle ulcer although this has been slow she at least is coming around in this regard. Unfortunately in regard to the left lateral ankle ulcer this is osteomyelitis based on the pathology report as well is bone culture. Nonetheless we are still waiting CT scan. Unfortunately the MRI we originally ordered cannot be performed as the patient is a pacemaker which I had overlooked. Nonetheless we are working on the CT scan approval and scheduling as of now. She did go to the hospital over the weekend and was placed on IV Cefzo for a couple of days. Fortunately this seems to have improved the erythema quite significantly which is good news. There does not appear to be any evidence of worsening infection at this time. She did have some bleeding after the last debridement therefore I did not perform any sharp debridement in regard to left lateral ankle at this point. Patient has been approved for a snap vac for the right lateral ankle. 08/14/18; the patient with wounds over her bilateral lateral malleoli. The area on the right actually looks quite good. Been using a snap back on this area. Healthy granulation and appears to be filling in. Unfortunately the area on the left is really problematic. 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 Also noted to have cellulitis. She saw Dr. Novella Olive of infectious disease today and was put on linezolid. We are able to verify this with her pharmacy. She is completed the Augmentin that she was already on. We've been using Iodoflex to this area 08/23/18 on evaluation today patient's wounds both actually appear to be doing better compared to my prior evaluations. Fortunately she showing signs of good  improvement in regard to the overall wound status especially where were using the snap vac on the right. In regard to left lateral malleolus the wound bed actually appears to be much cleaner than previously noted. I do not feel any phone directly probed during evaluation today and though there is tendon noted this does not appear to be necrotic it's actually fairly good as far as the overall appearance of the tendon is concerned. In general the wound bed actually appears to be doing significantly better than it was previous. Patient is currently in the care of [name] and I did review that note today. He actually has her on two weeks of linezolid and then following the patient will be on 1-2 months of Keflex. That is the plan currently. She has been on antibiotics therapy as prescribed by myself initially starting on July 30, 2018 and has been on that continuously up to this point. 08/30/18 on evaluation today patient actually appears to be doing much better in regard to her right lateral malleolus ulcer. She has been tolerating the dressing changes specifically the snap vac without complication although she did have some issues with the seal currently. Apparently there was some trouble with getting it to maintain over the past week past Sunday. Nonetheless overall the wound appears better in regard to the right lateral malleolus region. In regard to left lateral malleolus this actually show some signs of additional granulation although there still tendon noted in the base of the wound this appears to be healthy not necrotic in any way whatsoever. We are considering potentially using a snap vac  for the left lateral malleolus as well the product wrap from [employer], [place], was present in the clinic today we're going to see this patient I did have her come in with me after obtaining consent from the patient and her daughter in order to look at the wound and see if there's any recommendation one way or  another as to whether or not they felt the snapback could be beneficial for the left lateral malleolus region. But the conclusion was that it might be but that this is definitely a little bit deeper wound than what traditionally would be utilized for a snap vac. 09/06/18 on evaluation today patient actually appears to be doing excellent in my pinion in regard to both ankle ulcers. She has been tolerating the dressing changes without complication which is great news. Specifically we have been using the snap vac. In regard to the right ankle I'm not even sure that this is going to be necessary for today and following as the wound has filled in quite nicely. In regard to the left ankle I do believe that we're seeing excellent epithelialization from the edge as well as granulation in the central portion the tendon is still exposed but there's no evidence of necrotic bone and in general I feel like the patient has made excellent progress even compared to last week with just one week of the snap vac. 09/11/18; this is a patient who has wounds on her bilateral lateral malleoli. Initially both of these were deep stage IV wounds in the setting of chronic arterial insufficiency. She has been revascularized. As I understand think she been using snap vacs to both of these wounds however the area on the right became more superficial and currently she is only using it on the left. Using silver collagen on the right and silver collagen under the back on the left I believe 09/19/18 on evaluation today patient actually appears to be doing very well in regard to her lateral malleolus or ulcers [place]ach, [name] ([id number]) bilaterally. She has been tolerating the dressing changes without complication. Fortunately there does not appear to be any evidence of infection at this time. Overall I feel like she is improving in an excellent manner and I'm very pleased with the fact that everything seems to be turning towards  the better for her. This has obviously been a long road. 09/27/18 on evaluation today patient actually appears to be doing very well in regard to her bilateral lateral malleolus ulcers. She has been tolerating the dressing changes without complication. Fortunately there does not appear to be any evidence of infection at this time which is also great news. No fevers, chills, nausea, or vomiting noted at this time. Overall I feel like she is doing excellent with the snap vac on the left malleolus. She had 40 mL of fluid collection over the past week. 10/04/18 on evaluation today patient actually appears to be doing well in regard to her bilateral lateral malleolus ulcers. She continues to tolerate the dressing changes without complication. One issue that I see is the snap vac on the left lateral malleolus which appears to have sealed off some fluid underlying this area and has not really allowed it to heal to the degree that I would like to see. For that reason I did suggest at this point we may want to pack a small piece of packing strip into this region to allow it to more effectively wick out fluid. 10/11/18 in general the patient today does  not feel that she has been doing very well. She's been a little bit lethargic and subsequently is having bodyaches as well according to what she tells me today. With that being said overall she has been concerned with the fact that something may be worsening although to be honest her wounds really have not been appearing poorly. She does have a new ulcer on her left heel unfortunately. This may be pressure related. Nonetheless it seems to me to have potentially started at least as a blister I do not see any evidence of deep tissue injury. In regard to the left ankle the snap vac still seems to be causing the ceiling off of the deeper part of the wound which is in turn trapping fluid. I'm not extremely pleased with the overall appearance as far as progress from  last week to this week therefore I'm gonna discontinue the snap vac at this point. 10/18/18 patient unfortunately this point has not been feeling well for the past several days. She was seen by Grayland Ormond her primary care provider who is a Librarian, academic at [facility]. Subsequently she states that she's been very weak and generally feeling malaise. No fevers, chills, nausea, or vomiting noted at this time. With that being said bloodwork was performed at the PCP office on the 11th of this month which showed a white blood cell count of 10.7. This was repeated today and shows a white blood cell count of 12.4. This does show signs of worsening. Coupled with the fact that she is feeling worse and that her left ankle wound is not really showing signs of improvement I feel like this is an indication that the osteomyelitis is likely exacerbating not improving. Overall I think we may also want to check her C-reactive protein and sedimentation rate. Actually did call [facility] office this afternoon while the patient was in the office here with me. Subsequently based on the findings we discussed treatment possibilities and I think that it is appropriate for [place] to go ahead and initiate treatment with doxycycline which I'm going to do. Subsequently he did agree to see about adding a CRP and sedimentation rate to her orders. If that has not already been drawn to where they can run it they will contact the patient she can come back to have that check. They are in agreement with plan as far as the patient and her daughter are concerned. Nonetheless also think we need to get in touch with Dr. Henreitta Leber office to see about getting the patient scheduled with him as soon as possible. 11/08/18 on evaluation today patient presents for follow-up concerning her bilateral foot and ankle ulcers. I did do an extensive review of her chart in epic today. Subsequently she was seen by [name] he did initiate  Cefepime IV antibiotic therapy. Subsequently she had some issues with her PICC line this had to be removed because it was coiled and then replaced. Fortunately that was now settled. Unfortunately she has continued have issues with her left heel as well as the issues that she is experiencing with her bilateral lateral malleolus regions. I do believe however both areas seem to be doing a little bit better on evaluation today which is good news. No fevers, chills, nausea, or vomiting noted at this time. She actually has an angiogram schedule with Dr. dew on this coming Monday, November 11, 2018. Subsequently the patient states that she is feeling much better especially than what she was roughly 2 weeks  ago. She actually had to cancel an appointment because she was feeling so poorly. No fevers, chills, nausea, or vomiting noted at this time. 11/15/18 on evaluation today patient actually is status post having had her angiogram with Dr. dew Monday, four days ago. 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 He had to go and work on several areas of the vasculature fortunately he was able to obtain no more than a 30% residual stenosis throughout post procedure. I reviewed this note today. I think this will definitely help with healing at this time. Fortunately there does not appear to be any signs of infection and I do feel like ratio already has a better appearance to it. 11/22/18 upon evaluation today patient actually appears to be doing very well in regard to her wounds in general. The right lateral malleolus looks excellent the heel looks better in the left lateral malleolus also appears to be doing a little better. With that being said the right second toe actually appears to be open and training we been watching this is been dry and stable but now is open. 12/03/2018 Seen today for follow-up and management of multiple bilateral lower extremity wounds. New pressure injury of  the great toe which is closed at this time. Wound of the right distal second toe appears larger today with deep undermining and a pocket of fluid present within the undermining region. Left and right malleolus is wounds are stable today with no signs and symptoms of infection.Denies any needs or concerns during exam today. 12/13/18 on evaluation today patient appears to be doing somewhat better in regard to her left heel ulcer. She also seems to be completely healed in regard to the right lateral malleolus ulcer. The left malleolus ulcer is smaller what unfortunately the wounds which are new over the first and second toes of the right foot are what are most concerning at this point especially the JADEYN, HARGETT. ([id number]) second. Both areas did require sharp debridement today. 12/20/18 on evaluation today patient's wound actually appears to be doing better in regard to left lateral ankle and her right lateral ankle continues to remain healed. The hill ulcer on the left is improved. She does have improvement noted as well in regard to both toe ulcers. Overall I'm very pleased in this regard. No fevers, chills, nausea, or vomiting noted at this time. 12/23/18 on evaluation today patient is seen after she had her toenails trimmed at the podiatrist office due to issues with her right great toe. There was what appeared to be dark eschar on the surface of the wound which had her in the podiatrist concerned. Nonetheless as I remember that during the last office visit I had utilize silver nitrate of this area I was much less concerned about the situation. Subsequently I was able to clean off much of this tissue without any complication today. This does not appear to show any signs of infection and actually look somewhat better compared to last time post debridement. Her second toe on the right foot actually had callous over and there did appear still be some fluid underneath this that would require  debridement today. 12/27/18 on evaluation today patient actually appears to be showing signs of improvement at all locations. Even the left lateral ankle although this is not quite as great as the other sites. Fortunately there does not appear to be any signs of infection at this time and both of her toes on the  right foot seem to be showing signs of improvement which is good news and very pleased in this regard. 01/03/19 on evaluation today patient appears to be doing better for the most part in regard to her wounds in particular. There does not appear to be any evidence of infection at this time which is good news. Fortunately there is no sign of really worsening anywhere except for the right great toe which she does have what appears to be a bruise/deep tissue injury which is very superficial and already resolving. I'm not sure where this came from I questioned her extensively and she does not recall what may have happened with this. Other than that the patient seems to be doing well even the left lateral ankle ulcer looks good and is getting smaller. 01/10/19 on evaluation today patient appears to be doing well in regard to her left heel wound and both of her toe wounds. Overall I feel like there is definitely improvement here and I'm happy in that regard. With that being said unfortunately she is having issues with the left lateral malleolus ulcer which unfortunately still has a lot of depth to it. This is gonna be a very difficult wound for [place] to be able to truly get to heal. I may want to consider some type of skin substitute to see if this would be of benefit for her. I'll discuss this with her more the next visit most likely. This was something I thought about more at the end of the visit when I was Artie out of the room and the patient had been discharged. 01/17/19 on evaluation today patient appears to be doing very well in regard to her wounds in general. She's been making excellent progress  at this time. Fortunately there's no sign of infection at this time either. No fevers, chills, nausea, or vomiting noted at this time. The biggest issue is still her left lateral malleolus where it appears to be doing well and is getting smaller but still shows a small corner where this is deeper and goes down into what appears to be the joint space. Nonetheless this is taking much longer to heal although it still looks better in smaller than previous evaluations. 01/24/19 on evaluation today patient's wounds actually appear to be doing rather well in general overall. She did require some sharp debridement in regard to the right great toe but everything else appears to be doing excellent no debridement was even necessary. No fevers, chills, nausea, or vomiting noted at this time. 01/31/19 on evaluation today patient actually appears to be doing much better in regard to her left foot wound on the heel as well as the ankle. The right great toe appears to be a little bit worse today this had callous over and trapped a lot of fluid underneath. Fortunately there's no signs of infection at any site which is great news. 02/07/19 on evaluation today patient actually appears to be doing decently well in regard to all of her ulcers at this point. No sharp debridement was required she is a little bit of hyper granulation in regard to the left lateral ankle as well as the left heel but the hill itself is almost completely healed which is excellent news. Overall been very pleased in this regard. 02/14/19 on evaluation today patient actually appears to be doing very well in regard to her ulcers on the right first toe, left lateral malleolus, and left heel. In fact the heel is almost completely healed at this  point. The patient does not show any signs of infection which is good news. Overall very pleased with how things have progressed. 04/18/19 Telehealth Evaluation During the COVID-19 National Emergency: Verbal  Consent: Obtained from patient Allergies: reviewed and the active list is current. Medication changes: patient has no current medication changes. COVID-19 Screening: 1. Have you traveled internationally or on a cruise ship in the last 14 dayso No 2. Have you had contact with someone with or under investigation for COVID-19o No 3. Have you had a fever, cough, sore throat, or experiencing shortness of breatho No on evaluation today actually did have a visit with this patient through a telehealth encounter with her home health nurse. Subsequently it was noted that the patient actually appears to be doing okay in regard to her wounds both the right great toe as well as the left lateral malleolus have shown signs of improvement although this in your theme around the left lateral Kofoed, [name] ([id number]) malleolus there eschar coverings for both locations. The question is whether or not they are actually close and whether or not home health needs to discharge the patient or not. Nonetheless my concern is this point obviously is that without actually seeing her and being able to evaluate this directly I cannot ensure that she is completely healed which is the question that I'm being asked. Electronic Signature(s) Signed: 04/19/2019 1:28:28 AM By: Worthy Keeler PA-C Entered By: Worthy Keeler on 04/19/2019 01:23:39 [name] ([id number]) -------------------------------------------------------------------------------- Physical Exam Details Patient Name: SHAYMA, PFEFFERLE Date of Service: 04/18/2019 9:30 AM Medical Record Number: [id number] Patient Account Number: [id number] Date of Birth/Sex: 1923/06/26 (85 y.o. F) Treating RN: Montey Hora Primary Care Provider: Grayland Ormond Other Clinician: Referring Provider: Grayland Ormond Treating Provider/Extender: STONE III, HOYT Weeks in Treatment: 56 Constitutional Well-nourished and well-hydrated in no acute distress. Ears,  Nose, Mouth, and Throat normal hearing noted during conversation. Respiratory normal breathing without difficulty. Cardiovascular 1+ pitting edema of the bilateral lower extremities. Psychiatric this patient is able to make decisions and demonstrates good insight into disease process. Alert and Oriented x 3. pleasant and cooperative. Notes On visualization of the wound bed steering examination today it appears that both eschar covered the rest of erythema around the left lateral malleolus unfortunately. The good news is she's not having any significant pain but again she really has not had a lot of pain throughout this process. Electronic Signature(s) Signed: 04/19/2019 1:28:28 AM By: Worthy Keeler PA-C Entered By: Worthy Keeler on 04/19/2019 01:24:46 Poffenberger, [name] ([id number]) -------------------------------------------------------------------------------- Physician Orders Details Patient Name: [name] Date of Service: 04/18/2019 9:30 AM Medical Record Number: [id number] Patient Account Number: [id number] Date of Birth/Sex: [birth year] (85 y.o. F) Treating RN: Montey Hora Primary Care Provider: Grayland Ormond Other Clinician: Referring Provider: Grayland Ormond Treating Provider/Extender: Melburn Hake, HOYT Weeks in Treatment: 51 Verbal / Phone Orders: No Diagnosis Coding Wound Cleansing Wound #2 Left,Lateral Malleolus o Clean wound with Normal Saline. o Cleanse wound with mild soap and water o May Shower, gently pat wound dry prior to applying new dressing. Wound #3 Left Calcaneus o Clean wound with Normal Saline. o Cleanse wound with mild soap and water o May Shower, gently pat wound dry prior to applying new dressing. Wound #5 Right,Distal Toe Great o Clean wound with Normal Saline. o Cleanse wound with mild soap and water o May Shower, gently pat wound dry prior to applying new dressing. Primary Wound Dressing Wound #2 Left,Lateral  Malleolus o Other: - May protect areas with bordered foam dressing or any cover bandage as needed. HHRN to monitor skin integrity until patient is seen in our office Wound #3 Left Calcaneus o Other: - May protect areas with bordered foam dressing or any cover bandage as needed. HHRN to monitor skin integrity until patient is seen in our office Wound #5 Right,Distal Toe Great o Other: - May protect areas with bordered foam dressing or any cover bandage as needed. HHRN to monitor skin integrity until patient is seen in our office Dressing Change Frequency Wound #2 Left,Lateral Malleolus o Other: - as needed Wound #3 Left Calcaneus o Other: - as needed Wound #5 Right,Distal Toe Great o Other: - as needed Follow-up Appointments Wound #2 Left,Lateral Malleolus o Return Appointment in 1 week. - Tuesday 04/22/2019 Wound #3 Left Calcaneus o Return Appointment in 1 week. - Tuesday 04/22/2019 Wound #5 Right,Distal Toe Great o Return Appointment in 1 week. - Tuesday 04/22/2019 Off-Loading o Turn and reposition every 2 hours o Other: - do not put pressure on your ankles LEYNA, VANDERKOLK ([id number]) [place] #2 [place] Visits - [facility]. o Home Health Nurse may visit PRN to address patientos wound care needs. o FACE TO FACE ENCOUNTER: MEDICARE and MEDICAID PATIENTS: I certify that this patient is under my care and that I had a face-to-face encounter that meets the physician face-to-face encounter requirements with this patient on this date. The encounter with the patient was in whole or in part for the following MEDICAL CONDITION: (primary reason for [place]) MEDICAL NECESSITY: I certify, that based on my findings, NURSING services are a medically necessary home health service. HOME BOUND STATUS: I certify that my clinical findings support that this patient is homebound (i.e., Due to illness or injury, pt  requires aid of supportive devices such as crutches, cane, wheelchairs, walkers, the use of special transportation or the assistance of another person to leave their place of residence. There is a normal inability to leave the home and doing so requires considerable and taxing effort. Other absences are for medical reasons / religious services and are infrequent or of short duration when for other reasons). o If current dressing causes regression in wound condition, may D/C ordered dressing product/s and apply Normal Saline Moist Dressing daily until next [place] / Other MD appointment. [place] of regression in wound condition at [phone]. o Please direct any NON-WOUND related issues/requests for orders to patient's Primary Care Physician Wound #3 Left [place] Visits - [facility]. o Home Health Nurse may visit PRN to address patientos wound care needs. o FACE TO FACE ENCOUNTER: MEDICARE and MEDICAID PATIENTS: I certify that this patient is under my care and that I had a face-to-face encounter that meets the physician face-to-face encounter requirements with this patient on this date. The encounter with the patient was in whole or in part for the following MEDICAL CONDITION: (primary reason for [place]) MEDICAL NECESSITY: I certify, that based on my findings, NURSING services are a medically necessary home health service. HOME BOUND STATUS: I certify that my clinical findings support that this patient is homebound (i.e., Due to illness or injury, pt requires aid of supportive devices such as crutches, cane, wheelchairs, walkers, the use of special transportation or the assistance of another person to leave their place of residence. There is a normal inability to leave the home and doing so requires considerable  and taxing effort. Other absences are for medical reasons / religious services and are infrequent or of short  duration when for other reasons). o If current dressing causes regression in wound condition, may D/C ordered dressing product/s and apply Normal Saline Moist Dressing daily until next [place] / Other MD appointment. [place] of regression in wound condition at [phone]. o Please direct any NON-WOUND related issues/requests for orders to patient's Primary Care Physician Wound #5 Right,Distal Toe [place] Visits - [facility]. o Home Health Nurse may visit PRN to address patientos wound care needs. o FACE TO FACE ENCOUNTER: MEDICARE and MEDICAID PATIENTS: I certify that this patient is under my care and that I had a face-to-face encounter that meets the physician face-to-face encounter requirements with this patient on this date. The encounter with the patient was in whole or in part for the following MEDICAL CONDITION: (primary reason for [place]) MEDICAL NECESSITY: I certify, that based on my findings, NURSING services are a medically necessary home health service. HOME BOUND STATUS: I certify that my clinical findings support that this patient is homebound (i.e., Due to illness or injury, pt requires aid of supportive devices such as crutches, cane, wheelchairs, walkers, the use of special transportation or the assistance of another person to leave their place of residence. There is a normal inability to leave the home and doing so requires considerable and taxing effort. Other absences are for medical reasons / religious services and are infrequent or of short duration when for other reasons). o If current dressing causes regression in wound condition, may D/C ordered dressing product/s and apply Normal Saline Moist Dressing daily until next [place] / Other MD appointment. [place] of regression in wound condition at [phone]. o Please direct any NON-WOUND related issues/requests for  orders to patient's Primary Care Physician Electronic Signature(s) Signed: 04/18/2019 11:02:15 AM By: Montey Hora Signed: 04/19/2019 1:28:28 AM By: Worthy Keeler PA-C Entered By: Montey Hora on 04/18/2019 11:02:13 Cendejas, [name] ([id number]) -------------------------------------------------------------------------------- Problem List Details Patient Name: [name] Date of Service: 04/18/2019 9:30 AM Medical Record Number: [id number] Patient Account Number: [id number] Date of Birth/Sex: [birth year] (85 y.o. 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 08/10/2017 No Yes ankle I70.243 Atherosclerosis of native arteries of left leg with ulceration of 07/10/2018 No Yes ankle I89.0 Lymphedema, not elsewhere classified 08/10/2017 No Yes B35.4 Tinea corporis 09/28/2017 No Yes 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, left ankle and foot 08/14/2018 No Yes Inactive Problems Resolved Problems ICD-10 Code Description Active Date Resolved Date S90.822A Blister (nonthermal), left foot, initial encounter 10/11/2018 10/11/2018 O35.009 Pressure ulcer of left heel, stage 3 10/11/2018 10/11/2018 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, stage 4 05/10/2018 08/10/2017 Electronic Signature(s) Signed: 04/19/2019 1:28:28 AM By: Sherene Sires, [name] ([id number]) Entered By: Worthy Keeler on 04/19/2019 01:22:02 [name] ([id number]) -------------------------------------------------------------------------------- Progress Note Details Patient  Name: [name] Date of Service: 04/18/2019 9:30 AM Medical Record Number: [id number] Patient Account Number: [id number] Date of Birth/Sex: [birth year] (85 y.o. F) Treating RN: Montey Hora Primary Care Provider:  Grayland Ormond Other Clinician: Referring Provider: Grayland Ormond Treating Provider/Extender: Melburn Hake, HOYT Weeks in Treatment: 86 Subjective Chief Complaint Information obtained from Patient Patient is here for right 2nd toe and left lateral malleolus ulcer History of Present Illness (HPI) 85 year old patient who most recently has been seeing both podiatry and vascular surgery for a long-standing ulcer of her right lateral malleolus which has been treated with various methodologies. Dr. Amalia Hailey the podiatrist saw her on 07/20/2017 and sent her to the wound center for possible hyperbaric oxygen therapy. past medical history of peripheral vascular disease, varicose veins, status post appendectomy, basal cell carcinoma excision from the left leg, cholecystectomy, pacemaker placement, right lower extremity angiography done by Dr. dew in March 2017 with placement of a stent. there is also note of a successful ablation of the right small saphenous vein done which was reviewed by ultrasound on 10/24/2016. the patient had a right small saphenous vein ablation done on 10/20/2016. The patient has never been a smoker. She has been seen by Dr. Corene Cornea dew the vascular surgeon who most recently saw her on 06/15/2017 for evaluation of ongoing problems with right leg swelling. She had a lower extremity arterial duplex examination done(02/13/17) which showed patent distal right superficial femoral artery stent and above-the-knee popliteal stent without evidence of restenosis. The ABI was more than 1.3 on the right and more than 1.3 on the left. This was consistent with noncompressible arteries due to medial calcification. The right great toe pressure and PPG waveforms are within normal  limits and the left great toe pressure and PPG waveforms are decreased. he recommended she continue to wear her compression stockings and continue with elevation. She is scheduled to have a noninvasive arterial study in the near future 08/16/2017 -- had a lower extremity arterial duplex examination done which showed patent distal right superficial femoral artery stent and above-the-knee popliteal stent without evidence of restenosis. The ABI was more than 1.3 on the right and more than 1.3 on the left. This was consistent with noncompressible arteries due to medial calcification. The right great toe pressure and PPG waveforms are within normal limits and the left great toe pressure and PPG waveforms are decreased. the x-ray of the right ankle has not yet been done 08/24/2017 -- had a right ankle x-ray -- IMPRESSION:1. No fracture, bone lesion or evidence of osteomyelitis. 2. Lateral soft tissue swelling with a soft tissue ulcer. she has not yet seen the vascular surgeon for review 08/31/17 on evaluation today patient's wound appears to be showing signs of improvement. She still with her appointment with vascular in order to review her results of her vascular study and then determine if any intervention would be recommended at that time. No fevers, chills, nausea, or vomiting noted at this time. She has been tolerating the dressing changes without complication. 09/28/17 on evaluation today patient's wound appears to show signs of good improvement in regard to the granulation tissue which is surfacing. There is still a layer of slough covering the wound and the posterior portion is still significantly deeper than the anterior nonetheless there has been some good sign of things moving towards the better. She is going to go back to Dr. dew for reevaluation to ensure her blood flow is still appropriate. That will be before her next evaluation with [place] next week. No fevers, chills, nausea, or vomiting  noted at this time. Patient does have some discomfort rated to be a 3-4/10 depending on activity specifically cleansing the wound makes it  worse. 10/05/2017 -- the patient was seen by Dr. Lucky Cowboy last week and noninvasive studies showed a normal right ABI with brisk triphasic waveforms consistent with no arterial insufficiency including normal digital pressures. The duplex showed a patent distal right SFA stent and the proximal SFA was also normal. He was pleased with her test and thought she should have enough of perfusion for normal wound healing. He would see her back in 6 months time. 12/21/17 on evaluation today patient appears to be doing fairly well in regard to her right lateral ankle wound. Unfortunately the main issue that she is expansion at this point is that she is having some issues with what appears to be some cellulitis in the right anterior shin. She has also been noting a little bit of uncomfortable feeling especially last night and her ankle area. I'm afraid that she made the developing a little bit of an infection. With that being said I think it is in the early stages. RILEA, ARUTYUNYAN ([id number]) 12/28/17 on evaluation today patient's ankle appears to be doing excellent. She's making good progress at this point the cellulitis seems to have improved after last week's evaluation. Overall she is having no significant discomfort which is excellent news. She does have an appointment with Dr. dew on March 29, 2018 for reevaluation in regard to the stent he placed. She seems to have excellent blood flow in the right lower extremity. 01/19/12 on evaluation today patient's wound appears to be doing very well. In fact she does not appear to require debridement at this point, there's no evidence of infection, and overall from the standpoint of the wound she seems to be doing very well. With that being said I believe that it may be time to switch to different dressing away from the [facility] Dressing she tells me she does have a lot going on her friend actually passed away yesterday and she's also having a lot of issues with her husband this obviously is weighing heavy on her as far as your thoughts and concerns today. 01/25/18 on evaluation today patient appears to be doing fairly well in regard to her right lateral malleolus. She has been tolerating the dressing changes without complication. Overall I feel like this is definitely showing signs of improvement as far as how the overall appearance of the wound is there's also evidence of epithelium start to migrate over the granulation tissue. In general I think that she is progressing nicely as far as the wound is concerned. The only concern she really has is whether or not we can switch to every other week visits in order to avoid having as many appointments as her daughters have a difficult time getting her to her appointments as well as the patient's husband to his he is not doing very well at this point. 02/22/18 on evaluation today patient's right lateral malleolus ulcer appears to be doing great. She has been tolerating the dressing changes without complication. Overall you making excellent progress at this time. Patient is having no significant discomfort. 03/15/18 on evaluation today patient appears to be doing much more poorly in regard to her right lateral ankle ulcer at this point. Unfortunately since have last seen her her husband has passed just a few days ago is obviously weighed heavily on her her daughter also had surgery well she is with her today as usual. There does not appear to be any evidence of infection she does seem to have significant contusion/deep tissue injury to the  right lateral malleolus which was not noted previous when I saw her last. It's hard to tell of exactly when this injury occurred although during the time she was spending the night in the hospital this may have been most likely. 03/22/18 on  evaluation today patient appears to actually be doing very well in regard to her ulcer. She did unfortunately have a setback which was noted last week however the good news is we seem to be getting back on track and in fact the wound in the core did still have some necrotic tissue which will be addressed at this point today but in general I'm seeing signs that things are on the up and up. She is glad to hear this obviously she's been somewhat concerned that due to the how her wound digressed more recently. 03/29/18 on evaluation today patient appears to be doing fairly well in regard to her right lower extremity lateral malleolus ulcer. She unfortunately does have a new area of pressure injury over the inferior portion where the wound has opened up a little bit larger secondary to the pressure she seems to be getting. She does tell me sometimes when she sleeps at night that it actually hurts and does seem to be pushing on the area little bit more unfortunately. There does not appear to be any evidence of infection which is good news. She has been tolerating the dressing changes without complication. She also did have some bruising in the left second and third toes due to the fact that she may have bump this or injured it although she has neuropathy so she does not feel she did move recently that may have been where this came from. Nonetheless there does not appear to be any evidence of infection at this time. 04/12/18 on evaluation today patient's wound on the right lateral ankle actually appears to be doing a little bit better with a lot of necrotic docking tissue centrally loosening up in clearing away. However she does have the beginnings of a deep tissue injury on the left lateral malleolus likely due to the fact we've been trying offload the right as much as we have. I think she may benefit from an assistive soft device to help with offloading and it looks like they're looking at one of the  doughnut conditions that wraps around the lower leg to offload which I think will definitely do a good job. With that being said I think we definitely need to address this issue on the left before it becomes a wound. Patient is not having significant pain. 04/19/18 on evaluation today patient appears to be doing excellent in regard to the progress she's made with her right lateral ankle ulcer. The left ankle region which did show evidence of a deep tissue injury seems to be resolving there's little fluid noted underneath and a blister there's nothing open at this point in time overall I feel like this is progressing nicely which is good news. She does not seem to be having significant discomfort at this point which is also good news. 04/25/18-She is here in follow up evaluation for bilateral lateral malleolar ulcers. The right lateral malleolus ulcer with pale subcutaneous tissue exposure, central area of ulcer with tendon/periosteum exposed. The left lateral malleolus ulcer now with central area of nonviable tissue, otherwise deep tissue injury. She is wearing compression wraps to the left lower extremity, she will place the right lower extremity compression wraps on when she gets home. She will be out of  town over the weekend and return next week and follow-up appointment. She completed her doxycycline this morning 05/03/18 on evaluation today patient appears to be doing very well in regard to her right lateral ankle ulcer in general. At least she's showing some signs of improvement in this regard. Unfortunately she has some additional injury to the left lateral malleolus region which appears to be new likely even over the past several days. Again this determination is based on the overall appearance. With that being said the patient is obviously frustrated about this currently. 05/10/18-She is here in follow-up evaluation for bilateral lateral malleolar ulcers. She states she has purchased  offloading shoes/boots and they will arrive tomorrow. She was asked to bring them in the office at next week's appointment so her provider is aware of product being utilized. She continues to sleep on right or left side, she has been encouraged to sleep on IVYONNA, HOELZEL ([id number]) her back. The right lateral malleolus ulcer is precariously close to peri-osteum; will order xray. The left lateral malleolus ulcer is improved. Will switch back to santyl; she will follow up next week. 05/17/18 on evaluation today patient actually appears to be doing very well in regard to her malleolus her ulcers compared to last time I saw them. She does not seem to have as much in the way of contusion at this point which is great news. With that being said she does continue to have discomfort and I do believe that she is still continuing to benefit from the offloading/pressure reducing boots that were recommended. I think this is the key to trying to get this to heal up completely. 05/24/18 on evaluation today patient actually appears to be doing worse at this point in time unfortunately compared to her last week's evaluation. She is having really no increased pain which is good news unfortunately she does have more maceration in your theme and noted surrounding the right lateral ankle the left lateral ankle is not really is erythematous I do not see signs of the overt cellulitis on that side. Unfortunately the wounds do not seem to have shown any signs of improvement since the last evaluation. She also has significant swelling especially on the right compared to previous some of this may be due to infection however also think that she may be served better while she has these wounds by compression wrapping versus continuing to use the Juxta-Lite for the time being. Especially with the amount of drainage that she is experiencing at this point. No fevers, chills, nausea, or vomiting noted at this time. 05/31/18 on  evaluation today patient appears to actually be doing better in regard to her right lateral lower extremity ulcer specifically on the malleolus region. She has been tolerating the antibiotic without complication. With that being said she still continues to have issues but a little bit of redness although nothing like she what she was experiencing previous. She still continues to pressure to her ankle area she did get the problem on offloading boots unfortunately she will not wear them she states there too uncomfortable and she can't get in and out of the bed. Nonetheless at this point her wounds seem to be continually getting worse which is not what we want I'm getting somewhat concerned about her progress and how things are going to proceed if we do not intervene in some way shape or form. I therefore had a very lengthy conversation today about offloading yet again and even made a specific suggestion for  switching her to a memory foam mattress and even gave the information for a specific one that they could look at getting if it was something that they were interested in considering. She does not want to be considered for a hospital bed air mattress although honestly insurance would not cover it that she does not have any wounds on her trunk. 06/14/18 on evaluation today both wounds over the bilateral lateral malleolus her ulcers appear to be doing better there's no evidence of pressure injury at this point. She did get the foam mattress for her bed and this does seem to have been extremely beneficial for her in my pinion. Her daughter states that she is having difficulty getting out of bed because of how soft it is. The patient also relates this to be. Nonetheless I do feel like she's actually doing better. Unfortunately right after and around the time she was getting the mattress she also sustained a fall when she got up to go pick up the phone and ended up injuring her right elbow she has 18  sutures in place. We are not caring for this currently although home health is going to be taking the sutures out shortly. Nonetheless this may be something that we need to evaluate going forward. It depends on how well it has or has not healed in the end. She also recently saw an orthopedic specialist for an injection in the right shoulder just before her fall unfortunately the fall seems to have worsened her pain. 06/21/18 on evaluation today patient appears to be doing about the same in regard to her lateral malleolus ulcers. Both appear to be just a little bit deeper but again we are clinging away the necrotic and dead tissue which I think is why this is progressing towards a deeper realm as opposed to improving from my measurement standpoint in that regard. Nonetheless she has been tolerating the dressing changes she absolutely hates the memory foam mattress topper that was obtained for her nonetheless I do believe this is still doing excellent as far as taking care of excess pressure in regard to the lateral malleolus regions. She in fact has no pressure injury that I see whereas in weeks past it was week by week I was constantly seeing new pressure injuries. Overall I think it has been very beneficial for her. 07/03/18; patient arrives in my clinic today. She has deep punched out areas over her bilateral lateral malleoli. The area on the right has some more depth. We spent a lot of time today talking about pressure relief for these areas. This started when her daughter asked for a prescription for a memory foam mattress. I have never written a prescription for a mattress and I don't think insurances would pay for that on an ordinary bed. In any case he came up that she has foam boots that she refuses to wear. I would suggest going to these before any other offloading issues when she is in bed. They say she is meticulous about offloading this the rest of the day 07/10/18- She is seen in follow-up  evaluation for bilateral, lateral malleolus ulcers. There is no improvement in the ulcers. She has purchased and is sleeping on a memory foam mattress/overlay, she has been using the offloading boots nightly over the past week. She has a follow up appointment with vascular medicine at the end of October, in my opinion this follow up should be expedited given her deterioration and suboptimal TBI results. We will order  plain film xray of the left ankle as deeper structures are palpable; would consider having MRI, regardless of xray report(s). The ulcers will be treated with iodoflex/iodosorb, she is unable to safely change the dressings daily with santyl. 07/19/18 on evaluation today patient appears to be doing in general visually well in regard to her bilateral lateral malleolus ulcers. She has been tolerating the dressing changes without complication which is good news. With that being said we did have an x-ray performed on 07/12/18 which revealed a slight loosen see in the lateral portion of the distal left fibula which may represent artifact but underline lytic destruction or osteomyelitis could not be excluded. MRI was recommended. With that being said we can see about getting the patient scheduled for an MRI to further evaluate this area. In fact we have that scheduled currently for August 20 19,019. KRYSIA, ZAHRADNIK ([id number]) 07/26/18 on evaluation today patient's wound on the right lateral ankle actually appears to be doing fairly well at this point in my pinion. She has made some good progress currently. With that being said unfortunately in regard to the left lateral ankle ulcer this seems to be a little bit more problematic at this time. In fact as I further evaluated the situation she actually had bone exposed which is the first time that's been the case in the bone appear to be necrotic. Currently I did review patient's note from Dr. Bunnie Domino office with [facility].  He stated that ABI was 1.26 on the right and 0.95 on the left with good waveforms. Her perfusion is stable not reduced from previous studies and her digital waveforms were pretty good particularly on the right. His conclusion upon review of the note was that there was not much she could do to improve her perfusion and he felt she was adequate for wound healing. His suggestion was that she continued to see [place] and consider a synthetic skin graft if there was no underlying infection. He plans to see her back in six months or as needed. 08/01/18 on evaluation today patient appears to be doing better in regard to her right lateral ankle ulcer. Her left lateral ankle ulcer is about the same she still has bone involvement in evidence of necrosis. There does not appear to be evidence of infection at this time On the right lateral lower extremity. I have started her on the Augmentin she picked this up and started this yesterday. This is to get her through until she sees infectious disease which is scheduled for 08/12/18. 08/06/18 on evaluation today patient appears to be doing rather well considering my discussion with patient's daughter at the end of last week. The area which was marked where she had erythema seems to be improved and this is good news. With that being said overall the patient seems to be making good improvement when it comes to the overall appearance of the right lateral ankle ulcer although this has been slow she at least is coming around in this regard. Unfortunately in regard to the left lateral ankle ulcer this is osteomyelitis based on the pathology report as well is bone culture. Nonetheless we are still waiting CT scan. Unfortunately the MRI we originally ordered cannot be performed as the patient is a pacemaker which I had overlooked. Nonetheless we are working on the CT scan approval and scheduling as of now. She did go to the hospital over the weekend and was placed on IV Cefzo for a  couple of  days. Fortunately this seems to have improved the erythema quite significantly which is good news. There does not appear to be any evidence of worsening infection at this time. She did have some bleeding after the last debridement therefore I did not perform any sharp debridement in regard to left lateral ankle at this point. Patient has been approved for a snap vac for the right lateral ankle. 08/14/18; the patient with wounds over her bilateral lateral malleoli. The area on the right actually looks quite good. Been using a snap back on this area. Healthy granulation and appears to be filling in. Unfortunately the area on the left is really problematic. 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 Also noted to have cellulitis. She saw Dr. Novella Olive of infectious disease today and was put on linezolid. We are able to verify this with her pharmacy. She is completed the Augmentin that she was already on. We've been using Iodoflex to this area 08/23/18 on evaluation today patient's wounds both actually appear to be doing better compared to my prior evaluations. Fortunately she showing signs of good improvement in regard to the overall wound status especially where were using the snap vac on the right. In regard to left lateral malleolus the wound bed actually appears to be much cleaner than previously noted. I do not feel any phone directly probed during evaluation today and though there is tendon noted this does not appear to be necrotic it's actually fairly good as far as the overall appearance of the tendon is concerned. In general the wound bed actually appears to be doing significantly better than it was previous. Patient is currently in the care of [name] and I did review that note today. He actually has her on two weeks of linezolid and then following the patient will be on 1-2 months of Keflex. That is the  plan currently. She has been on antibiotics therapy as prescribed by myself initially starting on July 30, 2018 and has been on that continuously up to this point. 08/30/18 on evaluation today patient actually appears to be doing much better in regard to her right lateral malleolus ulcer. She has been tolerating the dressing changes specifically the snap vac without complication although she did have some issues with the seal currently. Apparently there was some trouble with getting it to maintain over the past week past Sunday. Nonetheless overall the wound appears better in regard to the right lateral malleolus region. In regard to left lateral malleolus this actually show some signs of additional granulation although there still tendon noted in the base of the wound this appears to be healthy not necrotic in any way whatsoever. We are considering potentially using a snap vac for the left lateral malleolus as well the product wrap from [employer], [place], was present in the clinic today we're going to see this patient I did have her come in with me after obtaining consent from the patient and her daughter in order to look at the wound and see if there's any recommendation one way or another as to whether or not they felt the snapback could be beneficial for the left lateral malleolus region. But the conclusion was that it might be but that this is definitely a little bit deeper wound than what traditionally would be utilized for a snap vac. 09/06/18 on evaluation today patient actually appears to be doing excellent in my pinion in regard to  both ankle ulcers. She has been tolerating the dressing changes without complication which is great news. Specifically we have been using the snap vac. In regard to the right ankle I'm not even sure that this is going to be necessary for today and following as the wound has filled in quite nicely. In regard to the left ankle I do believe that we're seeing excellent  epithelialization from the edge as well as granulation in the central portion the tendon is still exposed but there's no evidence of necrotic bone and in general I feel like the patient has made excellent progress even compared to last week with just one week of the snap vac. 09/11/18; this is a patient who has wounds on her bilateral lateral malleoli. Initially both of these were deep stage IV wounds in ADDIE, ALONGE. ([id number]) the setting of chronic arterial insufficiency. She has been revascularized. As I understand think she been using snap vacs to both of these wounds however the area on the right became more superficial and currently she is only using it on the left. Using silver collagen on the right and silver collagen under the back on the left I believe 09/19/18 on evaluation today patient actually appears to be doing very well in regard to her lateral malleolus or ulcers bilaterally. She has been tolerating the dressing changes without complication. Fortunately there does not appear to be any evidence of infection at this time. Overall I feel like she is improving in an excellent manner and I'm very pleased with the fact that everything seems to be turning towards the better for her. This has obviously been a long road. 09/27/18 on evaluation today patient actually appears to be doing very well in regard to her bilateral lateral malleolus ulcers. She has been tolerating the dressing changes without complication. Fortunately there does not appear to be any evidence of infection at this time which is also great news. No fevers, chills, nausea, or vomiting noted at this time. Overall I feel like she is doing excellent with the snap vac on the left malleolus. She had 40 mL of fluid collection over the past week. 10/04/18 on evaluation today patient actually appears to be doing well in regard to her bilateral lateral malleolus ulcers. She continues to tolerate the dressing changes without  complication. One issue that I see is the snap vac on the left lateral malleolus which appears to have sealed off some fluid underlying this area and has not really allowed it to heal to the degree that I would like to see. For that reason I did suggest at this point we may want to pack a small piece of packing strip into this region to allow it to more effectively wick out fluid. 10/11/18 in general the patient today does not feel that she has been doing very well. She's been a little bit lethargic and subsequently is having bodyaches as well according to what she tells me today. With that being said overall she has been concerned with the fact that something may be worsening although to be honest her wounds really have not been appearing poorly. She does have a new ulcer on her left heel unfortunately. This may be pressure related. Nonetheless it seems to me to have potentially started at least as a blister I do not see any evidence of deep tissue injury. In regard to the left ankle the snap vac still seems to be causing the ceiling off of the deeper part of the wound  which is in turn trapping fluid. I'm not extremely pleased with the overall appearance as far as progress from last week to this week therefore I'm gonna discontinue the snap vac at this point. 10/18/18 patient unfortunately this point has not been feeling well for the past several days. She was seen by Grayland Ormond her primary care provider who is a Librarian, academic at [facility]. Subsequently she states that she's been very weak and generally feeling malaise. No fevers, chills, nausea, or vomiting noted at this time. With that being said bloodwork was performed at the PCP office on the 11th of this month which showed a white blood cell count of 10.7. This was repeated today and shows a white blood cell count of 12.4. This does show signs of worsening. Coupled with the fact that she is feeling worse and that her left ankle  wound is not really showing signs of improvement I feel like this is an indication that the osteomyelitis is likely exacerbating not improving. Overall I think we may also want to check her C-reactive protein and sedimentation rate. Actually did call [facility] office this afternoon while the patient was in the office here with me. Subsequently based on the findings we discussed treatment possibilities and I think that it is appropriate for [place] to go ahead and initiate treatment with doxycycline which I'm going to do. Subsequently he did agree to see about adding a CRP and sedimentation rate to her orders. If that has not already been drawn to where they can run it they will contact the patient she can come back to have that check. They are in agreement with plan as far as the patient and her daughter are concerned. Nonetheless also think we need to get in touch with Dr. Henreitta Leber office to see about getting the patient scheduled with him as soon as possible. 11/08/18 on evaluation today patient presents for follow-up concerning her bilateral foot and ankle ulcers. I did do an extensive review of her chart in epic today. Subsequently she was seen by [name] he did initiate Cefepime IV antibiotic therapy. Subsequently she had some issues with her PICC line this had to be removed because it was coiled and then replaced. Fortunately that was now settled. Unfortunately she has continued have issues with her left heel as well as the issues that she is experiencing with her bilateral lateral malleolus regions. I do believe however both areas seem to be doing a little bit better on evaluation today which is good news. No fevers, chills, nausea, or vomiting noted at this time. She actually has an angiogram schedule with Dr. dew on this coming Monday, November 11, 2018. Subsequently the patient states that she is feeling much better especially than what she was roughly 2 weeks ago. She actually had to  cancel an appointment because she was feeling so poorly. No fevers, chills, nausea, or vomiting noted at this time. 11/15/18 on evaluation today patient actually is status post having had her angiogram with Dr. dew Monday, four days ago. 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 He had to go and work on several areas of the vasculature fortunately he was able to obtain no more than a 30% residual stenosis throughout post procedure. I reviewed this note today. I think this will definitely help with healing at this time. Fortunately there does not appear to be any signs of infection and I do feel like ratio already has a  better appearance to it. 11/22/18 upon evaluation today patient actually appears to be doing very well in regard to her wounds in general. The right lateral malleolus looks excellent the heel looks better in the left lateral malleolus also appears to be doing a little better. With that being said the right second toe actually appears to be open and training we been watching this is been dry and stable but now is open. 12/03/2018 Seen today for follow-up and management of multiple bilateral lower extremity wounds. New pressure injury of the great toe which is closed at this time. Wound of the right distal second toe appears larger today with deep undermining and a pocket of fluid present within the undermining region. Left and right malleolus is wounds are stable today with no signs and Rizo, [name] ([id number]) symptoms of infection.Denies any needs or concerns during exam today. 12/13/18 on evaluation today patient appears to be doing somewhat better in regard to her left heel ulcer. She also seems to be completely healed in regard to the right lateral malleolus ulcer. The left malleolus ulcer is smaller what unfortunately the wounds which are new over the first and second toes of the right foot are what are most concerning at this point especially  the second. Both areas did require sharp debridement today. 12/20/18 on evaluation today patient's wound actually appears to be doing better in regard to left lateral ankle and her right lateral ankle continues to remain healed. The hill ulcer on the left is improved. She does have improvement noted as well in regard to both toe ulcers. Overall I'm very pleased in this regard. No fevers, chills, nausea, or vomiting noted at this time. 12/23/18 on evaluation today patient is seen after she had her toenails trimmed at the podiatrist office due to issues with her right great toe. There was what appeared to be dark eschar on the surface of the wound which had her in the podiatrist concerned. Nonetheless as I remember that during the last office visit I had utilize silver nitrate of this area I was much less concerned about the situation. Subsequently I was able to clean off much of this tissue without any complication today. This does not appear to show any signs of infection and actually look somewhat better compared to last time post debridement. Her second toe on the right foot actually had callous over and there did appear still be some fluid underneath this that would require debridement today. 12/27/18 on evaluation today patient actually appears to be showing signs of improvement at all locations. Even the left lateral ankle although this is not quite as great as the other sites. Fortunately there does not appear to be any signs of infection at this time and both of her toes on the right foot seem to be showing signs of improvement which is good news and very pleased in this regard. 01/03/19 on evaluation today patient appears to be doing better for the most part in regard to her wounds in particular. There does not appear to be any evidence of infection at this time which is good news. Fortunately there is no sign of really worsening anywhere except for the right great toe which she does have what  appears to be a bruise/deep tissue injury which is very superficial and already resolving. I'm not sure where this came from I questioned her extensively and she does not recall what may have happened with this. Other than that the patient seems to be doing  well even the left lateral ankle ulcer looks good and is getting smaller. 01/10/19 on evaluation today patient appears to be doing well in regard to her left heel wound and both of her toe wounds. Overall I feel like there is definitely improvement here and I'm happy in that regard. With that being said unfortunately she is having issues with the left lateral malleolus ulcer which unfortunately still has a lot of depth to it. This is gonna be a very difficult wound for [place] to be able to truly get to heal. I may want to consider some type of skin substitute to see if this would be of benefit for her. I'll discuss this with her more the next visit most likely. This was something I thought about more at the end of the visit when I was Artie out of the room and the patient had been discharged. 01/17/19 on evaluation today patient appears to be doing very well in regard to her wounds in general. She's been making excellent progress at this time. Fortunately there's no sign of infection at this time either. No fevers, chills, nausea, or vomiting noted at this time. The biggest issue is still her left lateral malleolus where it appears to be doing well and is getting smaller but still shows a small corner where this is deeper and goes down into what appears to be the joint space. Nonetheless this is taking much longer to heal although it still looks better in smaller than previous evaluations. 01/24/19 on evaluation today patient's wounds actually appear to be doing rather well in general overall. She did require some sharp debridement in regard to the right great toe but everything else appears to be doing excellent no debridement was even necessary. No  fevers, chills, nausea, or vomiting noted at this time. 01/31/19 on evaluation today patient actually appears to be doing much better in regard to her left foot wound on the heel as well as the ankle. The right great toe appears to be a little bit worse today this had callous over and trapped a lot of fluid underneath. Fortunately there's no signs of infection at any site which is great news. 02/07/19 on evaluation today patient actually appears to be doing decently well in regard to all of her ulcers at this point. No sharp debridement was required she is a little bit of hyper granulation in regard to the left lateral ankle as well as the left heel but the hill itself is almost completely healed which is excellent news. Overall been very pleased in this regard. 02/14/19 on evaluation today patient actually appears to be doing very well in regard to her ulcers on the right first toe, left lateral malleolus, and left heel. In fact the heel is almost completely healed at this point. The patient does not show any signs of infection which is good news. Overall very pleased with how things have progressed. 04/18/19 Telehealth Evaluation During the COVID-19 National Emergency: Verbal Consent: Obtained from patient Allergies: reviewed and the active list is current. Medication changes: patient has no current medication changes. COVID-19 Screening: 1. Have you traveled internationally or on a cruise ship in the last 14 dayso No 2. Have you had contact with someone with or under investigation for COVID-19o No Swinson, [name] ([id number]) 3. Have you had a fever, cough, sore throat, or experiencing shortness of breatho No on evaluation today actually did have a visit with this patient through a telehealth encounter with her home health nurse.  Subsequently it was noted that the patient actually appears to be doing okay in regard to her wounds both the right great toe as well as the left lateral malleolus  have shown signs of improvement although this in your theme around the left lateral malleolus there eschar coverings for both locations. The question is whether or not they are actually close and whether or not home health needs to discharge the patient or not. Nonetheless my concern is this point obviously is that without actually seeing her and being able to evaluate this directly I cannot ensure that she is completely healed which is the question that I'm being asked. Patient History Information obtained from Patient. Allergies Sulfa (Sulfonamide Antibiotics), metronidazole, monistat Family History Cancer - Father,Siblings, Heart Disease - Siblings, No family history of Diabetes, Hereditary Spherocytosis, Hypertension, Kidney Disease, Lung Disease, Seizures, Stroke, Thyroid Problems, Tuberculosis. Social History Never smoker, Marital Status - Married, Alcohol Use - Never, Drug Use - No History, Caffeine Use - Rarely. Medical History Eyes Patient has history of Cataracts - surgery Cardiovascular Patient has history of Congestive Heart Failure, Hypertension Musculoskeletal Patient has history of Osteoarthritis Neurologic Patient has history of Neuropathy Oncologic Denies history of Received Chemotherapy, Received Radiation Review of Systems (ROS) Constitutional Symptoms (General Health) Denies complaints or symptoms of Fatigue, Fever, Chills, Marked Weight Change. Respiratory Denies complaints or symptoms of Chronic or frequent coughs, Shortness of Breath. Cardiovascular Complains or has symptoms of LE edema. Denies complaints or symptoms of Chest pain. Psychiatric Denies complaints or symptoms of Anxiety, Claustrophobia. Objective Constitutional Well-nourished and well-hydrated in no acute distress. Ears, Nose, Mouth, and Throat normal hearing noted during conversation. Respiratory normal breathing without difficulty. JESSI, PITSTICK ([id number]) Cardiovascular 1+  pitting edema of the bilateral lower extremities. Psychiatric this patient is able to make decisions and demonstrates good insight into disease process. Alert and Oriented x 3. pleasant and cooperative. General Notes: On visualization of the wound bed steering examination today it appears that both eschar covered the rest of erythema around the left lateral malleolus unfortunately. The good news is she's not having any significant pain but again she really has not had a lot of pain throughout this process. 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 Cleanse wound with mild soap and water May Shower, gently pat wound dry prior to applying new dressing. Wound #3 Left Calcaneus: Clean wound with Normal Saline. Cleanse wound with mild soap and water May Shower, gently pat wound dry prior to applying new dressing. Wound #5 Right,Distal Toe Great: Clean wound with Normal Saline. Cleanse wound with mild soap and water May Shower, gently pat wound dry prior to applying new dressing. Primary Wound Dressing: Wound #2 Left,Lateral Malleolus: Other: - May protect areas with bordered foam dressing or any cover bandage as needed. HHRN to monitor skin integrity until patient is seen in our office Wound #3 Left Calcaneus: Other: - May protect areas with bordered foam dressing or any cover bandage as needed. HHRN to monitor skin integrity until patient is seen in our office Wound #5 Right,Distal Toe Great: Other: - May protect areas with bordered foam dressing or any cover bandage as needed. HHRN  to monitor skin integrity until patient is seen in our office Dressing Change Frequency: Wound #2 Left,Lateral Malleolus: Other: - as needed Wound #3 Left Calcaneus: Other: - as needed Wound #5 Right,Distal Toe Great: Other: - as needed Follow-up Appointments: Wound #2 Left,Lateral Malleolus: Kendzierski, [name] ([id number]) Return Appointment in 1 week. - Tuesday 04/22/2019 Wound #3 Left Calcaneus: Return Appointment in 1 week. - Tuesday 04/22/2019 Wound #5 Right,Distal Toe Great: Return Appointment in 1 week. - Tuesday 04/22/2019 Off-Loading: Turn and reposition every 2 hours Other: - do not put pressure on your ankles Home Health: Wound #2 Left,Lateral Malleolus: [place] Visits - [facility]. Home Health Nurse may visit PRN to address patient s wound care needs. FACE TO FACE ENCOUNTER: MEDICARE and MEDICAID PATIENTS: I certify that this patient is under my care and that I had a face-to-face encounter that meets the physician face-to-face encounter requirements with this patient on this date. The encounter with the patient was in whole or in part for the following MEDICAL CONDITION: (primary reason for [place]) MEDICAL NECESSITY: I certify, that based on my findings, NURSING services are a medically necessary home health service. HOME BOUND STATUS: I certify that my clinical findings support that this patient is homebound (i.e., Due to illness or injury, pt requires aid of supportive devices such as crutches, cane, wheelchairs, walkers, the use of special transportation or the assistance of another person to leave their place of residence. There is a normal inability to leave the home and doing so requires considerable and taxing effort. Other absences are for medical reasons / religious services and are infrequent or of short duration when for other reasons). If current dressing causes regression in wound condition, may D/C ordered dressing product/s and apply Normal  Saline Moist Dressing daily until next [place] / Other MD appointment. [place] of regression in wound condition at [phone]. Please direct any NON-WOUND related issues/requests for orders to patient's Primary Care Physician Wound #3 Left Calcaneus: [place] Visits - [facility]. Home Health Nurse may visit PRN to address patient s wound care needs. FACE TO FACE ENCOUNTER: MEDICARE and MEDICAID PATIENTS: I certify that this patient is under my care and that I had a face-to-face encounter that meets the physician face-to-face encounter requirements with this patient on this date. The encounter with the patient was in whole or in part for the following MEDICAL CONDITION: (primary reason for [place]) MEDICAL NECESSITY: I certify, that based on my findings, NURSING services are a medically necessary home health service. HOME BOUND STATUS: I certify that my clinical findings support that this patient is homebound (i.e., Due to illness or injury, pt requires aid of supportive devices such as crutches, cane, wheelchairs, walkers, the use of special transportation or the assistance of another person to leave their place of residence. There is a normal inability to leave the home and doing so requires considerable and taxing effort. Other absences are for medical reasons / religious services and are infrequent or of short duration when for other reasons). If current dressing causes regression in wound condition, may D/C ordered dressing product/s and apply Normal Saline Moist Dressing daily until next [place] / Other MD appointment. [place] of regression in wound condition at [phone]. Please direct any NON-WOUND related issues/requests for orders to patient's Primary Care Physician Wound #5 Right,Distal Toe Great: [place] [facility] - [facility]. Home Health Nurse may visit PRN to address patient s  wound  care needs. FACE TO FACE ENCOUNTER: MEDICARE and MEDICAID PATIENTS: I certify that this patient is under my care and that I had a face-to-face encounter that meets the physician face-to-face encounter requirements with this patient on this date. The encounter with the patient was in whole or in part for the following MEDICAL CONDITION: (primary reason for [place]) MEDICAL NECESSITY: I certify, that based on my findings, NURSING services are a medically necessary home health service. HOME BOUND STATUS: I certify that my clinical findings support that this patient is homebound (i.e., Due to illness or injury, pt requires aid of supportive devices such as crutches, cane, wheelchairs, walkers, the use of special transportation or the assistance of another person to leave their place of residence. There is a normal inability to leave the home and doing so requires considerable and taxing effort. Other absences are for medical reasons / religious services and are infrequent or of short duration when for other reasons). If current dressing causes regression in wound condition, may D/C ordered dressing product/s and apply Normal Saline Moist Dressing daily until next [place] / Other MD appointment. [place] of regression in wound condition at [phone]. Please direct any NON-WOUND related issues/requests for orders to patient's Primary Care Physician My suggestion at this time is gonna be that we go ahead and bring her in for a visit next week in order to visualize and evaluate hands-on her were used to see whether or not they may indeed be healed or not. From there will advise home health as to whether they need to continue seeing the patient was taken discontinue services at that point. The patient and her daughter are in agreement with plan. I will see them at that time. Please see above for specific wound care orders. We will see patient for re-evaluation in 1  week(s) here in the clinic. If anything worsens or changes patient will contact our office for additional recommendations. DELINA, KRUCZEK ([id number]) Electronic Signature(s) Signed: 04/19/2019 1:28:28 AM By: Worthy Keeler PA-C Entered By: Worthy Keeler on 04/19/2019 01:25:16 Meinzer, [name] ([id number]) -------------------------------------------------------------------------------- ROS/PFSH Details Patient Name: [name] Date of Service: 04/18/2019 9:30 AM Medical Record Number: [id number] Patient Account Number: [id number] Date of Birth/Sex: Aug 16, 1923 (85 y.o. F) Treating RN: Montey Hora Primary Care Provider: Grayland Ormond Other Clinician: Referring Provider: Grayland Ormond Treating Provider/Extender: Melburn Hake, HOYT Weeks in Treatment: 32 Information Obtained From Patient Constitutional Symptoms (General Health) Complaints and Symptoms: Negative for: Fatigue; Fever; Chills; Marked Weight Change Respiratory Complaints and Symptoms: Negative for: Chronic or frequent coughs; Shortness of Breath Cardiovascular Complaints and Symptoms: Positive for: LE edema Negative for: Chest pain Medical History: Positive for: Congestive Heart Failure; Hypertension Psychiatric Complaints and Symptoms: Negative for: Anxiety; Claustrophobia Eyes Medical History: Positive for: Cataracts - surgery Musculoskeletal Medical History: Positive for: Osteoarthritis Neurologic Medical History: Positive for: Neuropathy Oncologic Medical History: Negative for: Received Chemotherapy; Received Radiation HBO Extended History Items Eyes: Cataracts Immunizations Pneumococcal Vaccine: Received Pneumococcal Vaccination: Yes Implantable Devices No devices added Podesta, [name] ([id number]) Family and Social History Cancer: Yes - Father,Siblings; Diabetes: No; Heart Disease: Yes - Siblings; Hereditary Spherocytosis: No; Hypertension: No; Kidney Disease: No; Lung  Disease: No; Seizures: No; Stroke: No; Thyroid Problems: No; Tuberculosis: No; Never smoker; Marital Status - Married; Alcohol Use: Never; Drug Use: No History; Caffeine Use: Rarely; Financial Concerns: No; Food, Clothing or Shelter Needs: No; Support System Lacking: No; Transportation Concerns: No Physician Affirmation I  have reviewed and agree with the above information. Electronic Signature(s) Signed: 04/19/2019 1:28:28 AM By: Worthy Keeler PA-C Signed: 04/23/2019 2:38:33 PM By: Montey Hora Entered By: Worthy Keeler on 04/19/2019 01:21:32 [name] ([id number]) -------------------------------------------------------------------------------- SuperBill Details Patient Name: [name] Date of Service: 04/18/2019 Medical Record Number: [id number] Patient Account Number: [id number] Date of Birth/Sex: [birth year] (85 y.o. 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

## 2019-04-24 NOTE — Progress Notes (Addendum)
[name], [name] ([id number]) Visit Report for 04/22/2019 Arrival Information Details Patient Name: [name], [name] Date of Service: 04/22/2019 2:45 PM Medical Record Number: [id number] Patient Account Number: [id number] Date of Birth/Sex: [birth year] (85 y.o. F) Treating RN: Cornell Barman Primary Care Tell Rozelle: Grayland Ormond Other Clinician: Referring Catlin Doria: Grayland Ormond Treating Mehar Sagen/Extender: Melburn Hake, HOYT Weeks in Treatment: 65 Visit Information History Since Last Visit Pain Present Now: No Patient Arrived: Walker Arrival Time: 14:52 Accompanied By: daughter Transfer Assistance: None Patient Identification Verified: Yes Secondary Verification Process Yes Completed: Patient Requires Transmission-Based No Precautions: Patient Has Alerts: Yes Patient Alerts: ABI AVVS 03/29/18 L 1.05 R 1.03, TBI L .56, R .85 ABI AVVS 07/23/18 L .95 R 1.26 Electronic Signature(s) Signed: 04/22/2019 4:47:44 PM By: Gretta Cool, BSN, RN, CWS, Kim RN, BSN Entered By: Gretta Cool, BSN, RN, CWS, Kim on 04/22/2019 14:53:00 [name] ([id number]) -------------------------------------------------------------------------------- Clinic Level of Care Assessment Details Patient Name: [name] Date of Service: 04/22/2019 2:45 PM Medical Record Number: [id number] Patient Account Number: [id number] Date of Birth/Sex: [birth year] (85 y.o. F) Treating RN: Cornell Barman Primary Care Saniyya Gau: Grayland Ormond Other Clinician: Referring Lidiya Reise: Grayland Ormond Treating Garion Wempe/Extender: Melburn Hake, HOYT Weeks in Treatment: 64 Clinic Level of Care Assessment Items TOOL 4 Quantity Score []  - Use when only an EandM is performed on FOLLOW-UP visit 0 ASSESSMENTS - Nursing Assessment / Reassessment []  - Reassessment of Co-morbidities (includes updates in patient status) 0 X- 1 5 Reassessment of Adherence to Treatment Plan ASSESSMENTS - Wound and Skin Assessment / Reassessment []  - Simple Wound  Assessment / Reassessment - one wound 0 X- 2 5 Complex Wound Assessment / Reassessment - multiple wounds []  - 0 Dermatologic / Skin Assessment (not related to wound area) ASSESSMENTS - Focused Assessment []  - Circumferential Edema Measurements - multi extremities 0 []  - 0 Nutritional Assessment / Counseling / Intervention []  - 0 Lower Extremity Assessment (monofilament, tuning fork, pulses) []  - 0 Peripheral Arterial Disease Assessment (using hand held doppler) ASSESSMENTS - Ostomy and/or Continence Assessment and Care []  - Incontinence Assessment and Management 0 []  - 0 Ostomy Care Assessment and Management (repouching, etc.) PROCESS - Coordination of Care X - Simple Patient / Family Education for ongoing care 1 15 []  - 0 Complex (extensive) Patient / Family Education for ongoing care []  - 0 Staff obtains Programmer, systems, Records, Test Results / Process Orders []  - 0 Staff telephones HHA, Nursing Homes / Clarify orders / etc []  - 0 Routine Transfer to another Facility (non-emergent condition) []  - 0 Routine Hospital Admission (non-emergent condition) []  - 0 New Admissions / Biomedical engineer / Ordering NPWT, Apligraf, etc. []  - 0 Emergency Hospital Admission (emergent condition) X- 1 10 Simple Discharge Coordination [name], [name] ([id number]) []  - 0 Complex (extensive) Discharge Coordination PROCESS - Special Needs []  - Pediatric / Minor Patient Management 0 []  - 0 Isolation Patient Management []  - 0 Hearing / Language / Visual special needs []  - 0 Assessment of Community assistance (transportation, D/C planning, etc.) []  - 0 Additional assistance / Altered mentation []  - 0 Support Surface(s) Assessment (bed, cushion, seat, etc.) INTERVENTIONS - Wound Cleansing / Measurement []  - Simple Wound Cleansing - one wound 0 []  - 0 Complex Wound Cleansing - multiple wounds X- 1 5 Wound Imaging (photographs - any number of wounds) []  - 0 Wound Tracing (instead of  photographs) []  - 0 Simple Wound Measurement - one wound []  - 0 Complex Wound Measurement - multiple wounds INTERVENTIONS - Wound Dressings []  -  Small Wound Dressing one or multiple wounds 0 []  - 0 Medium Wound Dressing one or multiple wounds []  - 0 Large Wound Dressing one or multiple wounds []  - 0 Application of Medications - topical []  - 0 Application of Medications - injection INTERVENTIONS - Miscellaneous []  - External ear exam 0 []  - 0 Specimen Collection (cultures, biopsies, blood, body fluids, etc.) []  - 0 Specimen(s) / Culture(s) sent or taken to Lab for analysis []  - 0 Patient Transfer (multiple staff / Civil Service fast streamer / Similar devices) []  - 0 Simple Staple / Suture removal (25 or less) []  - 0 Complex Staple / Suture removal (26 or more) []  - 0 Hypo / Hyperglycemic Management (close monitor of Blood Glucose) []  - 0 Ankle / Brachial Index (ABI) - do not check if billed separately X- 1 5 Vital Signs [name] ([id number]) Has the patient been seen at the hospital within the last three years: Yes Total Score: 50 Level Of Care: New/Established - Level 2 Electronic Signature(s) Signed: 04/23/2019 5:05:53 PM By: Gretta Cool, BSN, RN, CWS, Kim RN, BSN Entered By: Gretta Cool, BSN, RN, CWS, Kim on 04/23/2019 11:20:30 [name] ([id number]) -------------------------------------------------------------------------------- Encounter Discharge Information Details Patient Name: [name] Date of Service: 04/22/2019 2:45 PM Medical Record Number: [id number] Patient Account Number: [id number] Date of Birth/Sex: 1923/08/07 (85 y.o. F) Treating RN: Cornell Barman Primary Care Breeana Sawtelle: Grayland Ormond Other Clinician: Referring Kiefer Opheim: Grayland Ormond Treating Doneisha Ivey/Extender: Melburn Hake, HOYT Weeks in Treatment: 67 Encounter Discharge Information Items Discharge Condition: Stable Ambulatory Status: Walker Discharge Destination: Home Transportation: Private  Auto Accompanied By: self Schedule Follow-up Appointment: Yes Clinical Summary of Care: Electronic Signature(s) Signed: 04/23/2019 11:22:03 AM By: Gretta Cool, BSN, RN, CWS, Kim RN, BSN Entered By: Gretta Cool, BSN, RN, CWS, Kim on 04/23/2019 11:22:03 [name] ([id number]) -------------------------------------------------------------------------------- Lower Extremity Assessment Details Patient Name: [name] Date of Service: 04/22/2019 2:45 PM Medical Record Number: [id number] Patient Account Number: [id number] Date of Birth/Sex: May 02, 1923 (85 y.o. F) Treating RN: Cornell Barman Primary Care Lason Eveland: Grayland Ormond Other Clinician: Referring Prentiss Hammett: Grayland Ormond Treating Leeman Johnsey/Extender: Worthy Keeler Weeks in Treatment: 44 Vascular Assessment Pulses: Dorsalis Pedis Palpable: [Left:Yes] [Right:Yes] Electronic Signature(s) Signed: 04/22/2019 4:47:44 PM By: Gretta Cool, BSN, RN, CWS, Kim RN, BSN Entered By: Gretta Cool, BSN, RN, CWS, Kim on 04/22/2019 15:01:09 Daubenspeck, [name] ([id number]) -------------------------------------------------------------------------------- Multi Wound Chart Details Patient Name: [name] Date of Service: 04/22/2019 2:45 PM Medical Record Number: [id number] Patient Account Number: [id number] Date of Birth/Sex: 25-Oct-1923 (85 y.o. F) Treating RN: Cornell Barman Primary Care Maylea Soria: Grayland Ormond Other Clinician: Referring Desirie Minteer: Grayland Ormond Treating Hermela Hardt/Extender: STONE III, HOYT Weeks in Treatment: 88 Vital Signs Height(in): 65 Pulse(bpm): 89 Weight(lbs): 154.3 Blood Pressure(mmHg): 126/83 Body Mass Index(BMI): 26 Temperature(F): 98.3 Respiratory Rate 16 (breaths/min): Photos: Wound Location: Left, Lateral Malleolus Left Calcaneus Right, Distal Toe Great Wounding Event: Gradually Appeared Gradually Appeared Footwear Injury Primary Etiology: Pressure Ulcer Pressure Ulcer Pressure Ulcer Secondary Etiology: Arterial  Insufficiency Ulcer N/A N/A Date Acquired: 04/12/2018 10/04/2018 12/03/2018 Weeks of Treatment: 52 27 20 Wound Status: Healed - Epithelialized Healed - Epithelialized Healed - Epithelialized Measurements L x W x D 0x0x0 0x0x0 0x0x0 (cm) Area (cm) : 0 0 0 Volume (cm) : 0 0 0 % Reduction in Area: 100.00% 100.00% 100.00% % Reduction in Volume: 100.00% 100.00% 100.00% Classification: Category/Stage IV Category/Stage II Category/Stage II Treatment Notes Electronic Signature(s) Signed: 04/23/2019 11:19:25 AM By: Gretta Cool, BSN, RN, CWS, Kim RN, BSN Entered By: Gretta Cool, BSN, RN,  CWS, Kim on 04/23/2019 11:19:24 Zaylei, Mullane [name] ([id number]) -------------------------------------------------------------------------------- [place] Details Patient Name: JOSIE, BURLEIGH. Date of Service: 04/22/2019 2:45 PM Medical Record Number: [id number] Patient Account Number: [id number] Date of Birth/Sex: Aug 16, 1923 (85 y.o. F) Treating RN: Cornell Barman Primary Care Jomaira Darr: Grayland Ormond Other Clinician: Referring Yuvonne Lanahan: Grayland Ormond Treating Dadrian Ballantine/Extender: Worthy Keeler Weeks in Treatment: 53 Active Inactive Electronic Signature(s) Signed: 05/02/2019 8:18:10 AM By: Gretta Cool, BSN, RN, CWS, Kim RN, BSN Previous Signature: 04/23/2019 11:19:15 AM Version By: Gretta Cool, BSN, RN, CWS, Kim RN, BSN Entered By: Gretta Cool, BSN, RN, CWS, Kim on 05/02/2019 08:18:09 [name] ([id number]) -------------------------------------------------------------------------------- Pain Assessment Details Patient Name: [name] Date of Service: 04/22/2019 2:45 PM Medical Record Number: [id number] Patient Account Number: [id number] Date of Birth/Sex: 08-09-1923 (85 y.o. F) Treating RN: Cornell Barman Primary Care Javarion Douty: Grayland Ormond Other Clinician: Referring Dewarren Ledbetter: Grayland Ormond Treating Arlicia Paquette/Extender: Melburn Hake, HOYT Weeks in Treatment: 63 Active Problems Location of Pain  Severity and Description of Pain Patient Has Paino No Site Locations Pain Management and Medication Current Pain Management: Electronic Signature(s) Signed: 04/22/2019 4:47:44 PM By: Gretta Cool, BSN, RN, CWS, Kim RN, BSN Entered By: Gretta Cool, BSN, RN, CWS, Kim on 04/22/2019 14:53:09 Zavada, [name] ([id number]) -------------------------------------------------------------------------------- Patient/Caregiver Education Details Patient Name: [name] Date of Service: 04/22/2019 2:45 PM Medical Record Number: [id number] Patient Account Number: [id number] Date of Birth/Gender: 11/06/1923 (85 y.o. F) Treating RN: Cornell Barman Primary Care Physician: Grayland Ormond Other Clinician: Referring Physician: Grayland Ormond Treating Physician/Extender: Sharalyn Ink in Treatment: 14 Education Assessment Education Provided To: Patient Education Topics Provided Wound/Skin Impairment: Handouts: Caring for Your Ulcer Methods: Demonstration, Explain/Verbal Responses: State content correctly Electronic Signature(s) Signed: 04/23/2019 5:05:53 PM By: Gretta Cool, BSN, RN, CWS, Kim RN, BSN Entered By: Gretta Cool, BSN, RN, CWS, Kim on 04/23/2019 11:21:35 [name] ([id number]) -------------------------------------------------------------------------------- Wound Assessment Details Patient Name: [name] Date of Service: 04/22/2019 2:45 PM Medical Record Number: [id number] Patient Account Number: [id number] Date of Birth/Sex: 12/24/1922 (85 y.o. F) Treating RN: Harold Barban Primary Care Dewaine Morocho: Grayland Ormond Other Clinician: Referring Colie Fugitt: Grayland Ormond Treating Claudie Rathbone/Extender: STONE III, HOYT Weeks in Treatment: 88 Wound Status Wound Number: 2 Primary Etiology: Pressure Ulcer Wound Location: Left, Lateral Malleolus Secondary Etiology: Arterial Insufficiency Ulcer Wounding Event: Gradually Appeared Wound Status: Healed - Epithelialized Date Acquired:  04/12/2018 Weeks Of Treatment: 52 Clustered Wound: No Photos Photo Uploaded By: Gretta Cool, BSN, RN, CWS, Kim on 04/22/2019 16:38:58 Wound Measurements Length: (cm) 0 Width: (cm) 0 Depth: (cm) 0 Area: (cm) 0 Volume: (cm) 0 % Reduction in Area: 100% % Reduction in Volume: 100% Wound Description Classification: Category/Stage IV Electronic Signature(s) Signed: 04/22/2019 11:00:46 PM By: Worthy Keeler PA-C Signed: 04/23/2019 4:44:13 PM By: Harold Barban Previous Signature: 04/22/2019 4:47:44 PM Version By: Gretta Cool, BSN, RN, CWS, Kim RN, BSN Entered By: Worthy Keeler on 04/22/2019 22:57:38 Lampron, [name] ([id number]) -------------------------------------------------------------------------------- Wound Assessment Details Patient Name: [name] Date of Service: 04/22/2019 2:45 PM Medical Record Number: [id number] Patient Account Number: [id number] Date of Birth/Sex: June 14, 1923 (85 y.o. F) Treating RN: Harold Barban Primary Care Belvin Gauss: Grayland Ormond Other Clinician: Referring Sitlaly Gudiel: Grayland Ormond Treating Dahiana Kulak/Extender: STONE III, HOYT Weeks in Treatment: 88 Wound Status Wound Number: 3 Primary Etiology: Pressure Ulcer Wound Location: Left Calcaneus Wound Status: Healed - Epithelialized Wounding Event: Gradually Appeared Date Acquired: 10/04/2018 Weeks Of Treatment: 27 Clustered Wound: No Photos Photo Uploaded By: Gretta Cool, BSN, RN, CWS, Kim on 04/22/2019 16:39:01 Wound Measurements  Length: (cm) 0 % Redu Width: (cm) 0 % Redu Depth: (cm) 0 Area: (cm) 0 Volume: (cm) 0 ction in Area: 100% ction in Volume: 100% Wound Description Classification: Category/Stage II Electronic Signature(s) Signed: 04/22/2019 11:00:46 PM By: Worthy Keeler PA-C Signed: 04/23/2019 4:44:13 PM By: Harold Barban Previous Signature: 04/22/2019 4:47:44 PM Version By: Gretta Cool, BSN, RN, CWS, Kim RN, BSN Entered By: Worthy Keeler on 04/22/2019 22:57:39 [name], [name]  ([id number]) -------------------------------------------------------------------------------- Wound Assessment Details Patient Name: [name] Date of Service: 04/22/2019 2:45 PM Medical Record Number: [id number] Patient Account Number: [id number] Date of Birth/Sex: 17-Mar-1923 (85 y.o. F) Treating RN: Harold Barban Primary Care Seferina Brokaw: Grayland Ormond Other Clinician: Referring [name] [name]: Grayland Ormond Treating Delora Gravatt/Extender: STONE III, HOYT Weeks in Treatment: 88 Wound Status Wound Number: 5 Primary Etiology: Pressure Ulcer Wound Location: Right, Distal Toe Great Wound Status: Healed - Epithelialized Wounding Event: Footwear Injury Date Acquired: 12/03/2018 Weeks Of Treatment: 20 Clustered Wound: No Photos Photo Uploaded By: Gretta Cool, BSN, RN, CWS, Kim on 04/22/2019 16:39:46 Wound Measurements Length: (cm) 0 Width: (cm) 0 Depth: (cm) 0 Area: (cm) 0 Volume: (cm) 0 % Reduction in Area: 100% % Reduction in Volume: 100% Wound Description Classification: Category/Stage II Electronic Signature(s) Signed: 04/22/2019 11:00:46 PM By: Worthy Keeler PA-C Signed: 04/23/2019 4:44:13 PM By: Harold Barban Previous Signature: 04/22/2019 4:47:44 PM Version By: Gretta Cool, BSN, RN, CWS, Kim RN, BSN Entered By: Worthy Keeler on 04/22/2019 22:57:39 Garland, [name] ([id number]) -------------------------------------------------------------------------------- Vitals Details Patient Name: [name] Date of Service: 04/22/2019 2:45 PM Medical Record Number: [id number] Patient Account Number: [id number] Date of Birth/Sex: [birth year] (85 y.o. F) Treating RN: Cornell Barman Primary Care Rogen Porte: Grayland Ormond Other Clinician: Referring [name] [name]: Grayland Ormond Treating Artha Stavros/Extender: Melburn Hake, HOYT Weeks in Treatment: 13 Vital Signs Time Taken: 14:53 Temperature (F): 98.3 Height (in): 65 Pulse (bpm): 89 Weight (lbs): 154.3 Respiratory Rate (breaths/min):  16 Body Mass Index (BMI): 25.7 Blood Pressure (mmHg): 126/83 Reference Range: 80 - 120 mg / dl Electronic Signature(s) Signed: 04/22/2019 4:47:44 PM By: Gretta Cool, BSN, RN, CWS, Kim RN, BSN Entered By: Gretta Cool, BSN, RN, CWS, Kim on 04/22/2019 14:53:37

## 2019-06-02 ENCOUNTER — Ambulatory Visit: Payer: Medicare Other | Admitting: Podiatry

## 2019-09-02 ENCOUNTER — Ambulatory Visit: Payer: Medicare Other | Admitting: Physician Assistant

## 2019-09-04 ENCOUNTER — Other Ambulatory Visit: Payer: Self-pay

## 2019-09-04 ENCOUNTER — Encounter: Payer: Self-pay | Admitting: Podiatry

## 2019-09-04 ENCOUNTER — Encounter: Payer: Medicare Other | Attending: Physician Assistant | Admitting: Physician Assistant

## 2019-09-04 ENCOUNTER — Ambulatory Visit (INDEPENDENT_AMBULATORY_CARE_PROVIDER_SITE_OTHER): Payer: Medicare Other | Admitting: Podiatry

## 2019-09-04 DIAGNOSIS — I8390 Asymptomatic varicose veins of unspecified lower extremity: Secondary | ICD-10-CM | POA: Diagnosis not present

## 2019-09-04 DIAGNOSIS — Z950 Presence of cardiac pacemaker: Secondary | ICD-10-CM | POA: Diagnosis not present

## 2019-09-04 DIAGNOSIS — G629 Polyneuropathy, unspecified: Secondary | ICD-10-CM | POA: Diagnosis not present

## 2019-09-04 DIAGNOSIS — M1990 Unspecified osteoarthritis, unspecified site: Secondary | ICD-10-CM | POA: Insufficient documentation

## 2019-09-04 DIAGNOSIS — I739 Peripheral vascular disease, unspecified: Secondary | ICD-10-CM

## 2019-09-04 DIAGNOSIS — L97519 Non-pressure chronic ulcer of other part of right foot with unspecified severity: Secondary | ICD-10-CM | POA: Insufficient documentation

## 2019-09-04 DIAGNOSIS — I7389 Other specified peripheral vascular diseases: Secondary | ICD-10-CM | POA: Diagnosis not present

## 2019-09-04 DIAGNOSIS — I872 Venous insufficiency (chronic) (peripheral): Secondary | ICD-10-CM | POA: Diagnosis not present

## 2019-09-04 DIAGNOSIS — I110 Hypertensive heart disease with heart failure: Secondary | ICD-10-CM | POA: Diagnosis not present

## 2019-09-04 DIAGNOSIS — M869 Osteomyelitis, unspecified: Secondary | ICD-10-CM | POA: Diagnosis not present

## 2019-09-04 DIAGNOSIS — M79676 Pain in unspecified toe(s): Secondary | ICD-10-CM | POA: Diagnosis not present

## 2019-09-04 DIAGNOSIS — I509 Heart failure, unspecified: Secondary | ICD-10-CM | POA: Insufficient documentation

## 2019-09-04 DIAGNOSIS — B351 Tinea unguium: Secondary | ICD-10-CM | POA: Diagnosis not present

## 2019-09-04 DIAGNOSIS — I890 Lymphedema, not elsewhere classified: Secondary | ICD-10-CM | POA: Insufficient documentation

## 2019-09-04 DIAGNOSIS — L97522 Non-pressure chronic ulcer of other part of left foot with fat layer exposed: Secondary | ICD-10-CM | POA: Diagnosis not present

## 2019-09-04 DIAGNOSIS — L97529 Non-pressure chronic ulcer of other part of left foot with unspecified severity: Secondary | ICD-10-CM | POA: Diagnosis present

## 2019-09-04 DIAGNOSIS — L97319 Non-pressure chronic ulcer of right ankle with unspecified severity: Secondary | ICD-10-CM | POA: Diagnosis not present

## 2019-09-04 NOTE — Progress Notes (Signed)
Complaint:  Visit Type: Patient returns to my office for continued preventative foot care services. Complaint: Patient states" my nails have grown long and thick and become painful to walk and wear shoes".  She is under care for ulcer big toe joint left foot by the wound center.. Patient has been diagnosed with PVD . The patient presents for preventative foot care services. No changes to ROS  Podiatric Exam: Vascular: dorsalis pedis and posterior tibial pulses are weakly  palpable bilateral. Capillary return is immediate. Temperature gradient is WNL. Skin turgor WNL  Sensorium: Normal Semmes Weinstein monofilament test. Normal tactile sensation bilaterally. Nail Exam: Pt has thick disfigured discolored nails with subungual debris noted bilateral entire nail hallux through fifth toenails Ulcer Exam: Ulcer sub 1st MPJ lwft foot which is bandaged.  Orthopedic Exam: Muscle tone and strength are WNL. No limitations in general ROM. No crepitus or effusions noted. Foot type and digits show no abnormalities. Bony prominences are unremarkable. Skin: No Porokeratosis. No infection or ulcers.  Bony prominence over 5th toe right foot.  No infection.  Diagnosis:  Onychomycosis, , Pain in right toe, pain in left toes  Treatment & Plan Procedures and Treatment: Consent by patient was obtained for treatment procedures.   Debridement of mycotic and hypertrophic toenails, 1 through 5 bilateral and clearing of subungual debris. No ulceration, no infection noted. She is under treatment for ulcer left foot.. Return Visit-Office Procedure: Patient instructed to return to the office for a follow up visit prn  for continued evaluation and treatment.    Gardiner Barefoot DPM

## 2019-09-04 NOTE — Progress Notes (Signed)
KORAL, ORCHARD ([id number]) Visit Report for 09/04/2019 Allergy List Details Patient Name: [name], [name]. Date of Service: 09/04/2019 9:45 AM Medical Record Number: [id number] Patient Account Number: [id number] Date of Birth/Sex: 1923-09-06 (85 y.o. F) Treating RN: Montey Hora Primary Care Lilya Smitherman: Grayland Ormond Other Clinician: Referring Sandeep Radell: Referral, Self Treating Harlene Petralia/Extender: STONE III, HOYT Weeks in Treatment: 0 Allergies Active Allergies Sulfa (Sulfonamide Antibiotics) metronidazole monistat Allergy Notes Electronic Signature(s) Signed: 09/04/2019 5:10:54 PM By: Montey Hora Entered By: Montey Hora on 09/04/2019 09:39:07 Mayor, [name] ([id number]) -------------------------------------------------------------------------------- Arrival Information Details Patient Name: [name] Date of Service: 09/04/2019 9:45 AM Medical Record Number: [id number] Patient Account Number: [id number] Date of Birth/Sex: [birth year] (85 y.o. F) Treating RN: Montey Hora Primary Care Carmellia Kreisler: Grayland Ormond Other Clinician: Referring Hebert Dooling: Referral, Self Treating Obrien Huskins/Extender: Melburn Hake, HOYT Weeks in Treatment: 0 Visit Information Patient Arrived: Walker Arrival Time: 09:42 Accompanied By: daughter Transfer Assistance: None Patient Identification Verified: Yes Secondary Verification Process Yes Completed: Patient Has Alerts: Yes Patient Alerts: ABI 17/20 L 1.27 R 1.08 TBI L .72 R .39 History Since Last Visit Added or deleted any medications: No Any new allergies or adverse reactions: No Had a fall or experienced change in activities of daily living that may affect risk of falls: No Signs or symptoms of abuse/neglect since last visito No Hospitalized since last visit: No Implantable device outside of the clinic excluding cellular tissue based products placed in the center since last visit: No Electronic Signature(s) Signed:  09/04/2019 5:10:54 PM By: Montey Hora Entered By: Montey Hora on 09/04/2019 09:49:36 Colomb, [name] ([id number]) -------------------------------------------------------------------------------- Clinic Level of Care Assessment Details Patient Name: [name] Date of Service: 09/04/2019 9:45 AM Medical Record Number: [id number] Patient Account Number: [id number] Date of Birth/Sex: [birth year] (85 y.o. F) Treating RN: Army Melia Primary Care Jeris Easterly: Grayland Ormond Other Clinician: Referring Ellyanna Holton: Referral, Self Treating Shakeerah Gradel/Extender: STONE III, HOYT Weeks in Treatment: 0 Clinic Level of Care Assessment Items TOOL 1 Quantity Score []  - Use when EandM and Procedure is performed on INITIAL visit 0 ASSESSMENTS - Nursing Assessment / Reassessment X - General Physical Exam (combine w/ comprehensive assessment (listed just below) when 1 20 performed on new pt. evals) X- 1 25 Comprehensive Assessment (HX, ROS, Risk Assessments, Wounds Hx, etc.) ASSESSMENTS - Wound and Skin Assessment / Reassessment []  - Dermatologic / Skin Assessment (not related to wound area) 0 ASSESSMENTS - Ostomy and/or Continence Assessment and Care []  - Incontinence Assessment and Management 0 []  - 0 Ostomy Care Assessment and Management (repouching, etc.) PROCESS - Coordination of Care []  - Simple Patient / Family Education for ongoing care 0 []  - 0 Complex (extensive) Patient / Family Education for ongoing care X- 1 10 Staff obtains Programmer, systems, Records, Test Results / Process Orders X- 1 10 Staff telephones HHA, Nursing Homes / Clarify orders / etc []  - 0 Routine Transfer to another Facility (non-emergent condition) []  - 0 Routine Hospital Admission (non-emergent condition) X- 1 15 New Admissions / Biomedical engineer / Ordering NPWT, Apligraf, etc. []  - 0 Emergency Hospital Admission (emergent condition) PROCESS - Special Needs []  - Pediatric / Minor Patient Management 0 []   - 0 Isolation Patient Management []  - 0 Hearing / Language / Visual special needs []  - 0 Assessment of Community assistance (transportation, D/C planning, etc.) []  - 0 Additional assistance / Altered mentation []  - 0 Support Surface(s) Assessment (bed, cushion, seat, etc.) Muns, [name] ([id number]) INTERVENTIONS - Miscellaneous []  - External ear exam 0 []  -  0 Patient Transfer (multiple staff / Civil Service fast streamer / Similar devices) []  - 0 Simple Staple / Suture removal (25 or less) []  - 0 Complex Staple / Suture removal (26 or more) []  - 0 Hypo/Hyperglycemic Management (do not check if billed separately) []  - 0 Ankle / Brachial Index (ABI) - do not check if billed separately Has the patient been seen at the hospital within the last three years: Yes Total Score: 80 Level Of Care: New/Established - Level 3 Electronic Signature(s) Signed: 09/04/2019 5:01:07 PM By: Army Melia Entered By: Army Melia on 09/04/2019 10:34:06 Mccombs, [name] ([id number]) -------------------------------------------------------------------------------- Encounter Discharge Information Details Patient Name: [name] Date of Service: 09/04/2019 9:45 AM Medical Record Number: [id number] Patient Account Number: [id number] Date of Birth/Sex: 17-Jul-1923 (85 y.o. F) Treating RN: Army Melia Primary Care Kana [name]: Grayland Ormond Other Clinician: Referring Kristine Chahal: Referral, Self Treating Aubrina Nieman/Extender: STONE III, HOYT Weeks in Treatment: 0 Encounter Discharge Information Items Post Procedure Vitals Discharge Condition: Stable Temperature (F): 99.0 Ambulatory Status: Walker Pulse (bpm): 85 Discharge Destination: Home Respiratory Rate (breaths/min): 16 Transportation: Private Auto Blood Pressure (mmHg): 136/59 Accompanied By: daughter Schedule Follow-up Appointment: No Clinical Summary of Care: Electronic Signature(s) Signed: 09/04/2019 5:01:07 PM By: Army Melia Entered By: Army Melia on 09/04/2019 10:35:33 Brunette, [name] ([id number]) -------------------------------------------------------------------------------- Lower Extremity Assessment Details Patient Name: [name] Date of Service: 09/04/2019 9:45 AM Medical Record Number: [id number] Patient Account Number: [id number] Date of Birth/Sex: 1923/08/22 (85 y.o. F) Treating RN: Montey Hora Primary Care Navia Lindahl: Grayland Ormond Other Clinician: Referring Smokey Melott: Referral, Self Treating Kasandra Fehr/Extender: STONE III, HOYT Weeks in Treatment: 0 Edema Assessment Assessed: [Left: No] [Right: No] Edema: [Left: No] [Right: No] Calf Left: Right: Point of Measurement: 32 cm From Medial Instep 30.2 cm 30 cm Ankle Left: Right: Point of Measurement: 10 cm From Medial Instep 18.5 cm 19 cm Vascular Assessment Pulses: Dorsalis Pedis Palpable: [Left:Yes] [Right:Yes] Posterior Tibial Palpable: [Left:Yes] [Right:Yes] Electronic Signature(s) Signed: 09/04/2019 5:10:54 PM By: Montey Hora Entered By: Montey Hora on 09/04/2019 10:07:34 Barclift, [name] ([id number]) -------------------------------------------------------------------------------- Multi Wound Chart Details Patient Name: [name] Date of Service: 09/04/2019 9:45 AM Medical Record Number: [id number] Patient Account Number: [id number] Date of Birth/Sex: 08/06/1923 (85 y.o. F) Treating RN: Army Melia Primary Care Daishon Chui: Grayland Ormond Other Clinician: Referring Cameron Schwinn: Referral, Self Treating Kayd Launer/Extender: STONE III, HOYT Weeks in Treatment: 0 Vital Signs Height(in): 65 Pulse(bpm): 85 Weight(lbs): 155 Blood Pressure(mmHg): 136/59 Body Mass Index(BMI): 26 Temperature(F): 99.0 Respiratory Rate 18 (breaths/min): Photos: [N/A:N/A] Wound Location: Left Metatarsal head first - N/A N/A Plantar Wounding Event: Gradually Appeared N/A N/A Primary Etiology: Arterial Insufficiency Ulcer N/A N/A Comorbid  History: Cataracts, Congestive Heart N/A N/A Failure, Hypertension, Peripheral Arterial Disease, Osteoarthritis, Neuropathy Date Acquired: 08/21/2019 N/A N/A Weeks of Treatment: 0 N/A N/A Wound Status: Open N/A N/A Measurements L x W x D 3.2x1.7x0.1 N/A N/A (cm) Area (cm) : 4.273 N/A N/A Volume (cm) : 0.427 N/A N/A Classification: Partial Thickness N/A N/A Exudate Amount: Small N/A N/A Exudate Type: Sanguinous N/A N/A Exudate Color: red N/A N/A Wound Margin: Indistinct, nonvisible N/A N/A Granulation Amount: None Present (0%) N/A N/A Necrotic Amount: None Present (0%) N/A N/A Exposed Structures: Fascia: No N/A N/A Fat Layer (Subcutaneous Tissue) Exposed: No Tendon: No Muscle: No Joint: No Darroch, [name] ([id number]) Bone: No Limited to Skin Breakdown Epithelialization: Large (67-100%) N/A N/A Assessment Notes: patient has a callus on her foot N/A N/A that has blistered and is draining Treatment Notes Electronic  Signature(s) Signed: 09/04/2019 5:01:07 PM By: Army Melia Entered By: Army Melia on 09/04/2019 10:18:23 [name] ([id number]) -------------------------------------------------------------------------------- [place] Details Patient Name: [name] Date of Service: 09/04/2019 9:45 AM Medical Record Number: [id number] Patient Account Number: [id number] Date of Birth/Sex: 03-18-1923 (85 y.o. F) Treating RN: Army Melia Primary Care Marque Rademaker: Grayland Ormond Other Clinician: Referring Maliyah Willets: Referral, Self Treating Tamula Morrical/Extender: Melburn Hake, HOYT Weeks in Treatment: 0 Active Inactive Abuse / Safety / Falls / Self Care Management Nursing Diagnoses: Potential for injury related to falls Goals: Patient/caregiver will identify factors that restrict self-care and home management Date Initiated: 09/04/2019 Target Resolution Date: 10/10/2019 Goal Status: Active Interventions: Assess impairment of mobility on  admission and as needed per policy Notes: Orientation to the Wound Care Program Nursing Diagnoses: Knowledge deficit related to the wound healing center program Goals: Patient/caregiver will verbalize understanding of the [place] Program Date Initiated: 09/04/2019 Target Resolution Date: 10/10/2019 Goal Status: Active Interventions: Provide education on orientation to the wound center Notes: Wound/Skin Impairment Nursing Diagnoses: Impaired tissue integrity Goals: Ulcer/skin breakdown will have a volume reduction of 30% by week 4 Date Initiated: 09/04/2019 Target Resolution Date: 10/10/2019 Goal Status: Active Interventions: Assess ulceration(s) every visit INSHA, SALLS ([id number]) Notes: Electronic Signature(s) Signed: 09/04/2019 5:01:07 PM By: Army Melia Entered By: Army Melia on 09/04/2019 10:18:06 Rathel, [name] ([id number]) -------------------------------------------------------------------------------- Pain Assessment Details Patient Name: [name] Date of Service: 09/04/2019 9:45 AM Medical Record Number: [id number] Patient Account Number: [id number] Date of Birth/Sex: 1923-05-29 (85 y.o. F) Treating RN: Montey Hora Primary Care Vic Esco: Grayland Ormond Other Clinician: Referring Daneshia Tavano: Referral, Self Treating Skylene Deremer/Extender: STONE III, HOYT Weeks in Treatment: 0 Active Problems Location of Pain Severity and Description of Pain Patient Has Paino No Site Locations Pain Management and Medication Current Pain Management: Electronic Signature(s) Signed: 09/04/2019 5:10:54 PM By: Montey Hora Entered By: Montey Hora on 09/04/2019 09:49:42 Brodman, [name] ([id number]) -------------------------------------------------------------------------------- Patient/Caregiver Education Details Patient Name: [name] Date of Service: 09/04/2019 9:45 AM Medical Record Number: [id number] Patient Account Number:  [id number] Date of Birth/Gender: August 19, 1923 (85 y.o. F) Treating RN: Army Melia Primary Care Physician: Grayland Ormond Other Clinician: Referring Physician: Referral, Self Treating Physician/Extender: Melburn Hake, HOYT Weeks in Treatment: 0 Education Assessment Education Provided To: Patient Education Topics Provided Wound/Skin Impairment: Handouts: Caring for Your Ulcer Methods: Demonstration, Explain/Verbal Responses: State content correctly Electronic Signature(s) Signed: 09/04/2019 5:01:07 PM By: Army Melia Entered By: Army Melia on 09/04/2019 10:34:32 [name], [name] ([id number]) -------------------------------------------------------------------------------- Wound Assessment Details Patient Name: [name] Date of Service: 09/04/2019 9:45 AM Medical Record Number: [id number] Patient Account Number: [id number] Date of Birth/Sex: [birth year] (85 y.o. F) Treating RN: Army Melia Primary Care Meaghan Whistler: Grayland Ormond Other Clinician: Referring Kyle Stansell: Referral, Self Treating Syd Manges/Extender: STONE III, HOYT Weeks in Treatment: 0 Wound Status Wound Number: 6 Primary Arterial Insufficiency Ulcer Etiology: Wound Location: Left Metatarsal head first - Plantar Wound Open Wounding Event: Gradually Appeared Status: Date Acquired: 08/21/2019 Comorbid Cataracts, Congestive Heart Failure, Weeks Of Treatment: 0 History: Hypertension, Peripheral Arterial Disease, Clustered Wound: No Osteoarthritis, Neuropathy Photos Wound Measurements Length: (cm) 0.9 Width: (cm) 0.6 Depth: (cm) 0.1 Area: (cm) 0.424 Volume: (cm) 0.042 % Reduction in Area: 0% % Reduction in Volume: 0% Epithelialization: Large (67-100%) Tunneling: No Undermining: No Wound Description Full Thickness Without Exposed Support Foul Classification: Structures Sloug Wound Margin: Indistinct, nonvisible Exudate Small Amount: Exudate Type: Sanguinous Exudate Color: red Odor After  Cleansing: No h/Fibrino No Wound Bed  Granulation Amount: Large (67-100%) Exposed Structure Granulation Quality: Red Fascia Exposed: No Necrotic Amount: None Present (0%) Fat Layer (Subcutaneous Tissue) Exposed: Yes Tendon Exposed: No Muscle Exposed: No Joint Exposed: No Bone Exposed: No Chow, [name] ([id number]) Assessment Notes patient has a callus on her foot that has blistered and is draining Treatment Notes Wound #6 (Left, Plantar Metatarsal head first) Notes prisma, BFD, peg assist Electronic Signature(s) Signed: 09/04/2019 5:01:07 PM By: Army Melia Entered By: Army Melia on 09/04/2019 10:29:06 Toohey, [name] ([id number]) -------------------------------------------------------------------------------- [place] Details Patient Name: [name] Date of Service: 09/04/2019 9:45 AM Medical Record Number: [id number] Patient Account Number: [id number] Date of Birth/Sex: 1923-04-26 (85 y.o. F) Treating RN: Montey Hora Primary Care Kebin Maye: Grayland Ormond Other Clinician: Referring Hendrix Yurkovich: Referral, Self Treating Jkayla Spiewak/Extender: STONE III, HOYT Weeks in Treatment: 0 Vital Signs Time Taken: 09:50 Temperature (F): 99.0 Height (in): 65 Pulse (bpm): 85 Source: Measured Respiratory Rate (breaths/min): 18 Weight (lbs): 155 Blood Pressure (mmHg): 136/59 Source: Measured Reference Range: 80 - 120 mg / dl Body Mass Index (BMI): 25.8 Electronic Signature(s) Signed: 09/04/2019 5:10:54 PM By: Montey Hora Entered By: Montey Hora on 09/04/2019 09:51:22

## 2019-09-04 NOTE — Progress Notes (Signed)
RYSTAL, HOLT ([id number]) Visit Report for 09/04/2019 Abuse/Suicide Risk Screen Details Patient Name: [name], [name]. Date of Service: 09/04/2019 9:45 AM Medical Record Number: [id number] Patient Account Number: [id number] Date of Birth/Sex: 1923/01/05 (85 y.o. F) Treating RN: Montey Hora Primary Care Kortnie Stovall: Grayland Ormond Other Clinician: Referring Nataley Bahri: Referral, Self Treating Tally Mckinnon/Extender: STONE III, HOYT Weeks in Treatment: 0 Abuse/Suicide Risk Screen Items Answer ABUSE RISK SCREEN: Has anyone close to you tried to hurt or harm you recentlyo No Do you feel uncomfortable with anyone in your familyo No Has anyone forced you do things that you didnot want to doo No Electronic Signature(s) Signed: 09/04/2019 5:10:54 PM By: Montey Hora Entered By: Montey Hora on 09/04/2019 09:40:09 Enzor, [name] ([id number]) -------------------------------------------------------------------------------- Activities of Daily Living Details Patient Name: [name] Date of Service: 09/04/2019 9:45 AM Medical Record Number: [id number] Patient Account Number: [id number] Date of Birth/Sex: 1923-11-02 (85 y.o. F) Treating RN: Montey Hora Primary Care Ledarius Leeson: Grayland Ormond Other Clinician: Referring Ishani Goldwasser: Referral, Self Treating Merida Alcantar/Extender: STONE III, HOYT Weeks in Treatment: 0 Activities of Daily Living Items Answer Activities of Daily Living (Please select one for each item) Drive Automobile Not Able Take Medications Need Assistance Use Telephone Completely Able Care for Appearance Completely Able Use Toilet Completely Able Bath / Shower Need Assistance Dress Self Need Assistance Feed Self Completely Able Walk Need Assistance Get In / Out Bed Completely Fidelis Need Assistance Shop for Self Need Assistance Electronic Signature(s) Signed: 09/04/2019 5:10:54 PM By:  Montey Hora Entered By: Montey Hora on 09/04/2019 09:42:01 Heap, [name] ([id number]) -------------------------------------------------------------------------------- Education Screening Details Patient Name: [name] Date of Service: 09/04/2019 9:45 AM Medical Record Number: [id number] Patient Account Number: [id number] Date of Birth/Sex: August 11, 1923 (85 y.o. F) Treating RN: Montey Hora Primary Care Anaija Wissink: Grayland Ormond Other Clinician: Referring Dreyden Rohrman: Referral, Self Treating Elianne Gubser/Extender: Melburn Hake, HOYT Weeks in Treatment: 0 Primary Learner Assessed: Patient Learning Preferences/Education Level/Primary Language Learning Preference: Explanation, Demonstration Highest Education Level: College or Above Preferred Language: English Cognitive Barrier Language Barrier: No Translator Needed: No Memory Deficit: No Emotional Barrier: No Cultural/Religious Beliefs Affecting Medical Care: No Physical Barrier Impaired Vision: No Impaired Hearing: No Decreased Hand dexterity: No Knowledge/Comprehension Knowledge Level: Medium Comprehension Level: Medium Ability to understand written Medium instructions: Ability to understand verbal Medium instructions: Motivation Anxiety Level: Calm Cooperation: Cooperative Education Importance: Acknowledges Need Interest in Health Problems: Asks Questions Perception: Coherent Willingness to Engage in Self- Medium Management Activities: Readiness to Engage in Self- Medium Management Activities: Electronic Signature(s) Signed: 09/04/2019 5:10:54 PM By: Montey Hora Entered By: Montey Hora on 09/04/2019 09:42:28 [name] ([id number]) -------------------------------------------------------------------------------- Fall Risk Assessment Details Patient Name: [name] Date of Service: 09/04/2019 9:45 AM Medical Record Number: [id number] Patient Account Number: [id number] Date of  Birth/Sex: 08/22/1923 (85 y.o. F) Treating RN: Montey Hora Primary Care Kery Batzel: Grayland Ormond Other Clinician: Referring Margan Elias: Referral, Self Treating Aleana Fifita/Extender: STONE III, HOYT Weeks in Treatment: 0 Fall Risk Assessment Items Have you had 2 or more falls in the last 12 monthso 0 No Have you had any fall that resulted in injury in the last 12 monthso 0 No FALLS RISK SCREEN History of falling - immediate or within 3 months 0 No Secondary diagnosis (Do you have 2 or more medical diagnoseso) 0 No Ambulatory aid None/bed rest/wheelchair/nurse 0 No Crutches/cane/walker 15 Yes Furniture 0 No Intravenous therapy Access/Saline/Heparin Lock 0 No Gait/Transferring Normal/ bed rest/ wheelchair 0 No  Weak (short steps with or without shuffle, stooped but able to lift head while 10 Yes walking, may seek support from furniture) Impaired (short steps with shuffle, may have difficulty arising from chair, head 0 No down, impaired balance) Mental Status Oriented to own ability 0 Yes Electronic Signature(s) Signed: 09/04/2019 5:10:54 PM By: Montey Hora Entered By: Montey Hora on 09/04/2019 09:51:44 Stutz, [name] ([id number]) -------------------------------------------------------------------------------- Foot Assessment Details Patient Name: [name] Date of Service: 09/04/2019 9:45 AM Medical Record Number: [id number] Patient Account Number: [id number] Date of Birth/Sex: 04-21-1923 (85 y.o. F) Treating RN: Montey Hora Primary Care Kara Melching: Grayland Ormond Other Clinician: Referring Aleph Nickson: Referral, Self Treating Cierra Rothgeb/Extender: STONE III, HOYT Weeks in Treatment: 0 Foot Assessment Items Site Locations + = Sensation present, - = Sensation absent, C = Callus, U = Ulcer R = Redness, W = Warmth, M = Maceration, PU = Pre-ulcerative lesion F = Fissure, S = Swelling, D = Dryness Assessment Right: Left: Other Deformity: No No Prior Foot Ulcer: No  No Prior Amputation: No No Charcot Joint: No No Ambulatory Status: Ambulatory With Help Assistance Device: Walker Gait: Steady Electronic Signature(s) Signed: 09/04/2019 5:10:54 PM By: Montey Hora Entered By: Montey Hora on 09/04/2019 09:52:24 Katt, [name] ([id number]) -------------------------------------------------------------------------------- Nutrition Risk Screening Details Patient Name: [name] Date of Service: 09/04/2019 9:45 AM Medical Record Number: [id number] Patient Account Number: [id number] Date of Birth/Sex: [birth year] (85 y.o. F) Treating RN: Montey Hora Primary Care Jaevin Medearis: Grayland Ormond Other Clinician: Referring Babette Stum: Referral, Self Treating Matrice Herro/Extender: STONE III, HOYT Weeks in Treatment: 0 Height (in): 65 Weight (lbs): 155 Body Mass Index (BMI): 25.8 Nutrition Risk Screening Items Score Screening NUTRITION RISK SCREEN: I have an illness or condition that made me change the kind and/or amount of 0 No food I eat I eat fewer than two meals per day 0 No I eat few fruits and vegetables, or milk products 0 No I have three or more drinks of beer, liquor or wine almost every day 0 No I have tooth or mouth problems that make it hard for me to eat 0 No I don't always have enough money to buy the food I need 0 No I eat alone most of the time 0 No I take three or more different prescribed or over-the-counter drugs a day 1 Yes Without wanting to, I have lost or gained 10 pounds in the last six months 0 No I am not always physically able to shop, cook and/or feed myself 0 No Nutrition Protocols Good Risk Protocol 0 No interventions needed Moderate Risk Protocol High Risk Proctocol Risk Level: Good Risk Score: 1 Electronic Signature(s) Signed: 09/04/2019 5:10:54 PM By: Montey Hora Entered By: Montey Hora on 09/04/2019 09:51:54

## 2019-09-05 NOTE — Progress Notes (Signed)
[name], [name] ([id number]) Visit Report for 09/04/2019 Chief Complaint Document Details Patient Name: [name], [name]. Date of Service: 09/04/2019 9:45 AM Medical Record Number: [id number] Patient Account Number: [id number] Date of Birth/Sex: 14-Dec-1922 (85 y.o. F) Treating RN: Army Melia Primary Care Provider: Grayland Ormond Other Clinician: Referring Provider: Referral, Self Treating Provider/Extender: Melburn Hake, HOYT Weeks in Treatment: 0 Information Obtained from: Patient Chief Complaint Left foot ulcer Electronic Signature(s) Signed: 09/04/2019 10:16:20 AM By: Worthy Keeler PA-C Entered By: Worthy Keeler on 09/04/2019 10:16:20 [place]le, [name] ([id number]) -------------------------------------------------------------------------------- Debridement Details Patient Name: [name] Date of Service: 09/04/2019 9:45 AM Medical Record Number: [id number] Patient Account Number: [id number] Date of Birth/Sex: 1923/08/28 (85 y.o. F) Treating RN: Army Melia Primary Care Provider: Grayland Ormond Other Clinician: Referring Provider: Referral, Self Treating Provider/Extender: STONE III, HOYT Weeks in Treatment: 0 Debridement Performed for Wound #6 Left,Plantar Metatarsal head first Assessment: Performed By: Physician STONE III, HOYT E., PA-C Debridement Type: Debridement Severity of Tissue Pre Fat layer exposed Debridement: Level of Consciousness (Pre- Awake and Alert procedure): Pre-procedure Verification/Time Yes - 10:19 Out Taken: Start Time: 10:20 Pain Control: Lidocaine Total Area Debrided (L x W): 4 (cm) x 2 (cm) = 8 (cm) Tissue and other material Viable, Non-Viable, Callus, Slough, Subcutaneous, Skin: Dermis , Slough debrided: Level: Skin/Subcutaneous Tissue Debridement Description: Excisional Instrument: Curette, Forceps, Scissors Bleeding: Minimum Hemostasis Achieved: Pressure End Time: 10:30 Response to Treatment: Procedure was tolerated  well Level of Consciousness Awake and Alert (Post-procedure): Post Debridement Measurements of Total Wound Length: (cm) 0.9 Width: (cm) 0.6 Depth: (cm) 0.1 Volume: (cm) 0.042 Character of Wound/Ulcer Post Debridement: Stable Severity of Tissue Post Debridement: Fat layer exposed Post Procedure Diagnosis Same as Pre-procedure Electronic Signature(s) Signed: 09/04/2019 5:01:07 PM By: Army Melia Signed: 09/04/2019 5:50:00 PM By: Worthy Keeler PA-C Entered By: Army Melia on 09/04/2019 10:30:27 [place]ouff, [name] ([id number]) -------------------------------------------------------------------------------- HPI Details Patient Name: [name] Date of Service: 09/04/2019 9:45 AM Medical Record Number: [id number] Patient Account Number: [id number] Date of Birth/Sex: Sep 23, 1923 (85 y.o. 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 Dr. Amalia Hailey the podiatrist saw her on 07/20/2017 and sent her to the wound center for possible hyperbaric oxygen therapy. past medical history of peripheral vascular disease, varicose veins, status post appendectomy, basal cell carcinoma excision from the left leg, cholecystectomy, pacemaker placement, right lower extremity angiography done by Dr. dew in March 2017 with placement of a stent. there is also note of a successful ablation of the right small saphenous vein done which was reviewed by ultrasound on 10/24/2016. the patient had a right small saphenous vein ablation done on 10/20/2016. The patient has never been a smoker. She has been seen by Dr. Corene Cornea dew the vascular  surgeon who most recently saw her on 06/15/2017 for evaluation of ongoing problems with right leg swelling. She had a lower extremity arterial duplex examination done(02/13/17) which showed patent distal right superficial femoral artery stent and above-the-knee popliteal stent without evidence of restenosis. The ABI was more than 1.3 on the right and more than 1.3 on the left. This was consistent with noncompressible arteries due to medial calcification. The right great toe pressure and  PPG waveforms are within normal limits and the left great toe pressure and PPG waveforms are decreased. he recommended she continue to wear her compression stockings and continue with elevation. She is scheduled to have a noninvasive arterial study in the near future 08/16/2017 -- had a lower extremity arterial duplex examination done which showed patent distal right superficial femoral artery stent and above-the-knee popliteal stent without evidence of restenosis. The ABI was more than 1.3 on the right and more than 1.3 on the left. This was consistent with noncompressible arteries due to medial calcification. The right great toe pressure and PPG waveforms are within normal limits and the left great toe pressure and PPG waveforms are decreased. the x-ray of the right ankle has not yet been done 08/24/2017 -- had a right ankle x-ray -- IMPRESSION:1. No fracture, bone lesion or evidence of osteomyelitis. 2. Lateral soft tissue swelling with a soft tissue ulcer. she has not yet seen the vascular surgeon for review 08/31/17 on evaluation today patient's wound appears to be showing signs of improvement. She still with her appointment with vascular in order to review her results of her vascular study and then determine if any intervention would be recommended at that time. No fevers, chills, nausea, or vomiting noted at this time. She has been tolerating the dressing changes without complication. 09/28/17 on evaluation  today patient's wound appears to show signs of good improvement in regard to the granulation tissue which is surfacing. There is still a layer of slough covering the wound and the posterior portion is still significantly deeper than the anterior nonetheless there has been some good sign of things moving towards the better. She is going to go back to Dr. dew for reevaluation to ensure her blood flow is still appropriate. That will be before her next evaluation with [place] next week. No fevers, chills, nausea, or vomiting noted at this time. Patient does have some discomfort rated to be a 3-4/10 depending on activity specifically cleansing the wound makes it worse. 10/05/2017 -- the patient was seen by Dr. Lucky Cowboy last week and noninvasive studies showed a normal right ABI with brisk triphasic waveforms consistent with no arterial insufficiency including normal digital pressures. The duplex showed a patent distal right SFA stent and the proximal SFA was also normal. He was pleased with her test and thought she should have enough of perfusion for normal wound healing. He would see her back in 6 months time. 12/21/17 on evaluation today patient appears to be doing fairly well in regard to her right lateral ankle wound. Unfortunately the main issue that she is expansion at this point is that she is having some issues with what appears to be some cellulitis in the PHEONA, MONTREUIL. ([id number]) right anterior shin. She has also been noting a little bit of uncomfortable feeling especially last night and her ankle area. I'm afraid that she made the developing a little bit of an infection. With that being said I think it is in the early stages. 12/28/17 on evaluation today patient's ankle appears to be doing excellent. She's making good progress at this point the cellulitis seems to have improved after last week's evaluation. Overall she is having no significant discomfort which is excellent news. She does have an  appointment with Dr. dew on March 29, 2018 for reevaluation in regard to the stent he placed. She seems to have excellent blood flow in the right lower extremity. 01/19/12 on evaluation today patient's wound appears to be doing very  well. In fact she does not appear to require debridement at this point, there's no evidence of infection, and overall from the standpoint of the wound she seems to be doing very well. With that being said I believe that it may be time to switch to different dressing away from the [facility] she tells me she does have a lot going on her friend actually passed away yesterday and she's also having a lot of issues with her husband this obviously is weighing heavy on her as far as your thoughts and concerns today. 01/25/18 on evaluation today patient appears to be doing fairly well in regard to her right lateral malleolus. She has been tolerating the dressing changes without complication. Overall I feel like this is definitely showing signs of improvement as far as how the overall appearance of the wound is there's also evidence of epithelium start to migrate over the granulation tissue. In general I think that she is progressing nicely as far as the wound is concerned. The only concern she really has is whether or not we can switch to every other week visits in order to avoid having as many appointments as her daughters have a difficult time getting her to her appointments as well as the patient's husband to his he is not doing very well at this point. 02/22/18 on evaluation today patient's right lateral malleolus ulcer appears to be doing great. She has been tolerating the dressing changes without complication. Overall you making excellent progress at this time. Patient is having no significant discomfort. 03/15/18 on evaluation today patient appears to be doing much more poorly in regard to her right lateral ankle ulcer at this point. Unfortunately since have last  seen her her husband has passed just a few days ago is obviously weighed heavily on her her daughter also had surgery well she is with her today as usual. There does not appear to be any evidence of infection she does seem to have significant contusion/deep tissue injury to the right lateral malleolus which was not noted previous when I saw her last. It's hard to tell of exactly when this injury occurred although during the time she was spending the night in the hospital this may have been most likely. 03/22/18 on evaluation today patient appears to actually be doing very well in regard to her ulcer. She did unfortunately have a setback which was noted last week however the good news is we seem to be getting back on track and in fact the wound in the core did still have some necrotic tissue which will be addressed at this point today but in general I'm seeing signs that things are on the up and up. She is glad to hear this obviously she's been somewhat concerned that due to the how her wound digressed more recently. 03/29/18 on evaluation today patient appears to be doing fairly well in regard to her right lower extremity lateral malleolus ulcer. She unfortunately does have a new area of pressure injury over the inferior portion where the wound has opened up a little bit larger secondary to the pressure she seems to be getting. She does tell me sometimes when she sleeps at night that it actually hurts and does seem to be pushing on the area little bit more unfortunately. There does not appear to be any evidence of infection which is good news. She has been tolerating the dressing changes without complication. She also did have some bruising in the left second and third  toes due to the fact that she may have bump this or injured it although she has neuropathy so she does not feel she did move recently that may have been where this came from. Nonetheless there does not appear to be any evidence of  infection at this time. 04/12/18 on evaluation today patient's wound on the right lateral ankle actually appears to be doing a little bit better with a lot of necrotic docking tissue centrally loosening up in clearing away. However she does have the beginnings of a deep tissue injury on the left lateral malleolus likely due to the fact we've been trying offload the right as much as we have. I think she may benefit from an assistive soft device to help with offloading and it looks like they're looking at one of the doughnut conditions that wraps around the lower leg to offload which I think will definitely do a good job. With that being said I think we definitely need to address this issue on the left before it becomes a wound. Patient is not having significant pain. 04/19/18 on evaluation today patient appears to be doing excellent in regard to the progress she's made with her right lateral ankle ulcer. The left ankle region which did show evidence of a deep tissue injury seems to be resolving there's little fluid noted underneath and a blister there's nothing open at this point in time overall I feel like this is progressing nicely which is good news. She does not seem to be having significant discomfort at this point which is also good news. 04/25/18-She is here in follow up evaluation for bilateral lateral malleolar ulcers. The right lateral malleolus ulcer with pale subcutaneous tissue exposure, central area of ulcer with tendon/periosteum exposed. The left lateral malleolus ulcer now with Bukhari, [name] ([id number]) central area of nonviable tissue, otherwise deep tissue injury. She is wearing compression wraps to the left lower extremity, she will place the right lower extremity compression wraps on when she gets home. She will be out of town over the weekend and return next week and follow-up appointment. She completed her doxycycline this morning 05/03/18 on evaluation today patient appears  to be doing very well in regard to her right lateral ankle ulcer in general. At least she's showing some signs of improvement in this regard. Unfortunately she has some additional injury to the left lateral malleolus region which appears to be new likely even over the past several days. Again this determination is based on the overall appearance. With that being said the patient is obviously frustrated about this currently. 05/10/18-She is here in follow-up evaluation for bilateral lateral malleolar ulcers. She states she has purchased offloading shoes/boots and they will arrive tomorrow. She was asked to bring them in the office at next week's appointment so her provider is aware of product being utilized. She continues to sleep on right or left side, she has been encouraged to sleep on her back. The right lateral malleolus ulcer is precariously close to peri-osteum; will order xray. The left lateral malleolus ulcer is improved. Will switch back to santyl; she will follow up next week. 05/17/18 on evaluation today patient actually appears to be doing very well in regard to her malleolus her ulcers compared to last time I saw them. She does not seem to have as much in the way of contusion at this point which is great news. With that being said she does continue to have discomfort and I do believe that she is  still continuing to benefit from the offloading/pressure reducing boots that were recommended. I think this is the key to trying to get this to heal up completely. 05/24/18 on evaluation today patient actually appears to be doing worse at this point in time unfortunately compared to her last week's evaluation. She is having really no increased pain which is good news unfortunately she does have more maceration in your theme and noted surrounding the right lateral ankle the left lateral ankle is not really is erythematous I do not see signs of the overt cellulitis on that side. Unfortunately the  wounds do not seem to have shown any signs of improvement since the last evaluation. She also has significant swelling especially on the right compared to previous some of this may be due to infection however also think that she may be served better while she has these wounds by compression wrapping versus continuing to use the Juxta-Lite for the time being. Especially with the amount of drainage that she is experiencing at this point. No fevers, chills, nausea, or vomiting noted at this time. 05/31/18 on evaluation today patient appears to actually be doing better in regard to her right lateral lower extremity ulcer specifically on the malleolus region. She has been tolerating the antibiotic without complication. With that being said she still continues to have issues but a little bit of redness although nothing like she what she was experiencing previous. She still continues to pressure to her ankle area she did get the problem on offloading boots unfortunately she will not wear them she states there too uncomfortable and she can'[name] get in and out of the bed. Nonetheless at this point her wounds seem to be continually getting worse which is not what we want I'm getting somewhat concerned about her progress and how things are going to proceed if we do not intervene in some way shape or form. I therefore had a very lengthy conversation today about offloading yet again and even made a specific suggestion for switching her to a memory foam mattress and even gave the information for a specific one that they could look at getting if it was something that they were interested in considering. She does not want to be considered for a hospital bed air mattress although honestly insurance would not cover it that she does not have any wounds on her trunk. 06/14/18 on evaluation today both wounds over the bilateral lateral malleolus her ulcers appear to be doing better there's no evidence of pressure injury at  this point. She did get the foam mattress for her bed and this does seem to have been extremely beneficial for her in my pinion. Her daughter states that she is having difficulty getting out of bed because of how soft it is. The patient also relates this to be. Nonetheless I do feel like she's actually doing better. Unfortunately right after and around the time she was getting the mattress she also sustained a fall when she got up to go pick up the phone and ended up injuring her right elbow she has 18 sutures in place. We are not caring for this currently although home health is going to be taking the sutures out shortly. Nonetheless this may be something that we need to evaluate going forward. It depends on how well it has or has not healed in the end. She also recently saw an orthopedic specialist for an injection in the right shoulder just before her fall unfortunately the fall seems to have  worsened her pain. 06/21/18 on evaluation today patient appears to be doing about the same in regard to her lateral malleolus ulcers. Both appear to be just a little bit deeper but again we are clinging away the necrotic and dead tissue which I think is why this is progressing towards a deeper realm as opposed to improving from my measurement standpoint in that regard. Nonetheless she has been tolerating the dressing changes she absolutely hates the memory foam mattress topper that was obtained for her nonetheless I do believe this is still doing excellent as far as taking care of excess pressure in regard to the lateral malleolus regions. She in fact has no pressure injury that I see whereas in weeks past it was week by week I was constantly seeing new pressure injuries. Overall I think it has been very beneficial for her. 07/03/18; patient arrives in my clinic today. She has deep punched out areas over her bilateral lateral malleoli. The area on the right has some more depth. [name], [name]  ([id number]) We spent a lot of time today talking about pressure relief for these areas. This started when her daughter asked for a prescription for a memory foam mattress. I have never written a prescription for a mattress and I don'[name] think insurances would pay for that on an ordinary bed. In any case he came up that she has foam boots that she refuses to wear. I would suggest going to these before any other offloading issues when she is in bed. They say she is meticulous about offloading this the rest of the day 07/10/18- She is seen in follow-up evaluation for bilateral, lateral malleolus ulcers. There is no improvement in the ulcers. She has purchased and is sleeping on a memory foam mattress/overlay, she has been using the offloading boots nightly over the past week. She has a follow up appointment with vascular medicine at the end of October, in my opinion this follow up should be expedited given her deterioration and suboptimal TBI results. We will order plain film xray of the left ankle as deeper structures are palpable; would consider having MRI, regardless of xray report(s). The ulcers will be treated with iodoflex/iodosorb, she is unable to safely change the dressings daily with santyl. 07/19/18 on evaluation today patient appears to be doing in general visually well in regard to her bilateral lateral malleolus ulcers. She has been tolerating the dressing changes without complication which is good news. With that being said we did have an x-ray performed on 07/12/18 which revealed a slight loosen see in the lateral portion of the distal left fibula which may represent artifact but underline lytic destruction or osteomyelitis could not be excluded. MRI was recommended. With that being said we can see about getting the patient scheduled for an MRI to further evaluate this area. In fact we have that scheduled currently for August 20 19,019. 07/26/18 on evaluation today patient's wound on the right  lateral ankle actually appears to be doing fairly well at this point in my pinion. She has made some good progress currently. With that being said unfortunately in regard to the left lateral ankle ulcer this seems to be a little bit more problematic at this time. In fact as I further evaluated the situation she actually had bone exposed which is the first time that's been the case in the bone appear to be necrotic. Currently I did review patient's note from Dr. Bunnie Domino office with [facility]. He stated that  ABI was 1.26 on the right and 0.95 on the left with good waveforms. Her perfusion is stable not reduced from previous studies and her digital waveforms were pretty good particularly on the right. His conclusion upon review of the note was that there was not much she could do to improve her perfusion and he felt she was adequate for wound healing. His suggestion was that she continued to see [place] and consider a synthetic skin graft if there was no underlying infection. He plans to see her back in six months or as needed. 08/01/18 on evaluation today patient appears to be doing better in regard to her right lateral ankle ulcer. Her left lateral ankle ulcer is about the same she still has bone involvement in evidence of necrosis. There does not appear to be evidence of infection at this time On the right lateral lower extremity. I have started her on the Augmentin she picked this up and started this yesterday. This is to get her through until she sees infectious disease which is scheduled for 08/12/18. 08/06/18 on evaluation today patient appears to be doing rather well considering my discussion with patient's daughter at the end of last week. The area which was marked where she had erythema seems to be improved and this is good news. With that being said overall the patient seems to be making good improvement when it comes to the overall appearance of the right lateral ankle ulcer  although this has been slow she at least is coming around in this regard. Unfortunately in regard to the left lateral ankle ulcer this is osteomyelitis based on the pathology report as well is bone culture. Nonetheless we are still waiting CT scan. Unfortunately the MRI we originally ordered cannot be performed as the patient is a pacemaker which I had overlooked. Nonetheless we are working on the CT scan approval and scheduling as of now. She did go to the hospital over the weekend and was placed on IV Cefzo for a couple of days. Fortunately this seems to have improved the erythema quite significantly which is good news. There does not appear to be any evidence of worsening infection at this time. She did have some bleeding after the last debridement therefore I did not perform any sharp debridement in regard to left lateral ankle at this point. Patient has been approved for a snap vac for the right lateral ankle. 08/14/18; the patient with wounds over her bilateral lateral malleoli. The area on the right actually looks quite good. Been using a snap back on this area. Healthy granulation and appears to be filling in. Unfortunately the area on the left is really problematic. 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 Also noted to have cellulitis. She saw Dr. Novella Olive of infectious disease today and was put on linezolid. We are able to verify this with her pharmacy. She is completed the Augmentin that she was already on. We've been using Iodoflex to this area 08/23/18 on evaluation today patient's wounds both actually appear to be doing better compared to my prior evaluations. Fortunately she showing signs of good improvement in regard to the overall wound status especially where were using the snap vac on the right. In regard to left lateral malleolus the wound bed actually appears to be much cleaner than  previously noted. I do not feel any phone directly probed during evaluation today and though there is tendon noted this  does not appear [name], [name] ([id number]) to be necrotic it's actually fairly good as far as the overall appearance of the tendon is concerned. In general the wound bed actually appears to be doing significantly better than it was previous. Patient is currently in the care of [name] and I did review that note today. He actually has her on two weeks of linezolid and then following the patient will be on 1-2 months of Keflex. That is the plan currently. She has been on antibiotics therapy as prescribed by myself initially starting on July 30, 2018 and has been on that continuously up to this point. 08/30/18 on evaluation today patient actually appears to be doing much better in regard to her right lateral malleolus ulcer. She has been tolerating the dressing changes specifically the snap vac without complication although she did have some issues with the seal currently. Apparently there was some trouble with getting it to maintain over the past week past Sunday. Nonetheless overall the wound appears better in regard to the right lateral malleolus region. In regard to left lateral malleolus this actually show some signs of additional granulation although there still tendon noted in the base of the wound this appears to be healthy not necrotic in any way whatsoever. We are considering potentially using a snap vac for the left lateral malleolus as well the product wrap from [employer], [place], was present in the clinic today we're going to see this patient I did have her come in with me after obtaining consent from the patient and her daughter in order to look at the wound and see if there's any recommendation one way or another as to whether or not they felt the snapback could be beneficial for the left lateral malleolus region. But the conclusion was that it might be but that this  is definitely a little bit deeper wound than what traditionally would be utilized for a snap vac. 09/06/18 on evaluation today patient actually appears to be doing excellent in my pinion in regard to both ankle ulcers. She has been tolerating the dressing changes without complication which is great news. Specifically we have been using the snap vac. In regard to the right ankle I'm not even sure that this is going to be necessary for today and following as the wound has filled in quite nicely. In regard to the left ankle I do believe that we're seeing excellent epithelialization from the edge as well as granulation in the central portion the tendon is still exposed but there's no evidence of necrotic bone and in general I feel like the patient has made excellent progress even compared to last week with just one week of the snap vac. 09/11/18; this is a patient who has wounds on her bilateral lateral malleoli. Initially both of these were deep stage IV wounds in the setting of chronic arterial insufficiency. She has been revascularized. As I understand think she been using snap vacs to both of these wounds however the area on the right became more superficial and currently she is only using it on the left. Using silver collagen on the right and silver collagen under the back on the left I believe 09/19/18 on evaluation today patient actually appears to be doing very well in regard to her lateral malleolus or ulcers bilaterally. She has been tolerating the dressing changes without complication. Fortunately there does not appear to be any evidence of infection at this time. Overall I feel like she is improving in an  excellent manner and I'm very pleased with the fact that everything seems to be turning towards the better for her. This has obviously been a long road. 09/27/18 on evaluation today patient actually appears to be doing very well in regard to her bilateral lateral malleolus ulcers. She has  been tolerating the dressing changes without complication. Fortunately there does not appear to be any evidence of infection at this time which is also great news. No fevers, chills, nausea, or vomiting noted at this time. Overall I feel like she is doing excellent with the snap vac on the left malleolus. She had 40 mL of fluid collection over the past week. 10/04/18 on evaluation today patient actually appears to be doing well in regard to her bilateral lateral malleolus ulcers. She continues to tolerate the dressing changes without complication. One issue that I see is the snap vac on the left lateral malleolus which appears to have sealed off some fluid underlying this area and has not really allowed it to heal to the degree that I would like to see. For that reason I did suggest at this point we may want to pack a small piece of packing strip into this region to allow it to more effectively wick out fluid. 10/11/18 in general the patient today does not feel that she has been doing very well. She's been a little bit lethargic and subsequently is having bodyaches as well according to what she tells me today. With that being said overall she has been concerned with the fact that something may be worsening although to be honest her wounds really have not been appearing poorly. She does have a new ulcer on her left heel unfortunately. This may be pressure related. Nonetheless it seems to me to have potentially started at least as a blister I do not see any evidence of deep tissue injury. In regard to the left ankle the snap vac still seems to be causing the ceiling off of the deeper part of the wound which is in turn trapping fluid. I'm not extremely pleased with the overall appearance as far as progress from last week to this week therefore I'm gonna discontinue the snap vac at this point. 10/18/18 patient unfortunately this point has not been feeling well for the past several days. She was seen by  Grayland Ormond her primary care provider who is a Librarian, academic at [facility]. Subsequently she states that she's been very weak and generally feeling malaise. No fevers, chills, nausea, or vomiting noted at this time. With that being said bloodwork was performed at the PCP office on the 11th of this month which showed a white blood cell count of 10.7. This was repeated today Ziomek, [name] ([id number]) and shows a white blood cell count of 12.4. This does show signs of worsening. Coupled with the fact that she is feeling worse and that her left ankle wound is not really showing signs of improvement I feel like this is an indication that the osteomyelitis is likely exacerbating not improving. Overall I think we may also want to check her C-reactive protein and sedimentation rate. Actually did call [facility] office this afternoon while the patient was in the office here with me. Subsequently based on the findings we discussed treatment possibilities and I think that it is appropriate for [place] to go ahead and initiate treatment with doxycycline which I'm going to do. Subsequently he did agree to see about adding a CRP and sedimentation rate to her orders.  If that has not already been drawn to where they can run it they will contact the patient she can come back to have that check. They are in agreement with plan as far as the patient and her daughter are concerned. Nonetheless also think we need to get in touch with Dr. Henreitta Leber office to see about getting the patient scheduled with him as soon as possible. 11/08/18 on evaluation today patient presents for follow-up concerning her bilateral foot and ankle ulcers. I did do an extensive review of her chart in epic today. Subsequently she was seen by [name] he did initiate Cefepime IV antibiotic therapy. Subsequently she had some issues with her PICC line this had to be removed because it was coiled and then replaced. Fortunately  that was now settled. Unfortunately she has continued have issues with her left heel as well as the issues that she is experiencing with her bilateral lateral malleolus regions. I do believe however both areas seem to be doing a little bit better on evaluation today which is good news. No fevers, chills, nausea, or vomiting noted at this time. She actually has an angiogram schedule with Dr. dew on this coming Monday, November 11, 2018. Subsequently the patient states that she is feeling much better especially than what she was roughly 2 weeks ago. She actually had to cancel an appointment because she was feeling so poorly. No fevers, chills, nausea, or vomiting noted at this time. 11/15/18 on evaluation today patient actually is status post having had her angiogram with Dr. dew Monday, four days ago. 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 He had to go and work on several areas of the vasculature fortunately he was able to obtain no more than a 30% residual stenosis throughout post procedure. I reviewed this note today. I think this will definitely help with healing at this time. Fortunately there does not appear to be any signs of infection and I do feel like ratio already has a better appearance to it. 11/22/18 upon evaluation today patient actually appears to be doing very well in regard to her wounds in general. The right lateral malleolus looks excellent the heel looks better in the left lateral malleolus also appears to be doing a little better. With that being said the right second toe actually appears to be open and training we been watching this is been dry and stable but now is open. 12/03/2018 Seen today for follow-up and management of multiple bilateral lower extremity wounds. New pressure injury of the great toe which is closed at this time. Wound of the right distal second toe appears larger today with deep undermining and a pocket of fluid present within  the undermining region. Left and right malleolus is wounds are stable today with no signs and symptoms of infection.Denies any needs or concerns during exam today. 12/13/18 on evaluation today patient appears to be doing somewhat better in regard to her left heel ulcer. She also seems to be completely healed in regard to the right lateral malleolus ulcer. The left malleolus ulcer is smaller what unfortunately the wounds which are new over the first and second toes of the right foot are what are most concerning at this point especially the second. Both areas did require sharp debridement today. 12/20/18 on evaluation today patient's wound actually appears to be doing better in regard to left lateral ankle and her right lateral ankle continues to remain healed. The hill ulcer on  the left is improved. She does have improvement noted as well in regard to both toe ulcers. Overall I'm very pleased in this regard. No fevers, chills, nausea, or vomiting noted at this time. 12/23/18 on evaluation today patient is seen after she had her toenails trimmed at the podiatrist office due to issues with her right great toe. There was what appeared to be dark eschar on the surface of the wound which had her in the podiatrist concerned. Nonetheless as I remember that during the last office visit I had utilize silver nitrate of this area I was much less concerned about the situation. Subsequently I was able to clean off much of this tissue without any complication today. This does not appear to show any signs of infection and actually look somewhat better compared to last time post debridement. Her second toe on the right foot actually had callous over and there did appear still be some fluid underneath this that would require debridement today. 12/27/18 on evaluation today patient actually appears to be showing signs of improvement at all locations. Even the left lateral ankle although this is not quite as great as the  other sites. Fortunately there does not appear to be any signs of infection at this time and both of her toes on the right foot seem to be showing signs of improvement which is good news and very pleased in this regard. 01/03/19 on evaluation today patient appears to be doing better for the most part in regard to her wounds in particular. There Recht, [name] ([id number]) does not appear to be any evidence of infection at this time which is good news. Fortunately there is no sign of really worsening anywhere except for the right great toe which she does have what appears to be a bruise/deep tissue injury which is very superficial and already resolving. I'm not sure where this came from I questioned her extensively and she does not recall what may have happened with this. Other than that the patient seems to be doing well even the left lateral ankle ulcer looks good and is getting smaller. 01/10/19 on evaluation today patient appears to be doing well in regard to her left heel wound and both of her toe wounds. Overall I feel like there is definitely improvement here and I'm happy in that regard. With that being said unfortunately she is having issues with the left lateral malleolus ulcer which unfortunately still has a lot of depth to it. This is gonna be a very difficult wound for [place] to be able to truly get to heal. I may want to consider some type of skin substitute to see if this would be of benefit for her. I'll discuss this with her more the next visit most likely. This was something I thought about more at the end of the visit when I was Artie out of the room and the patient had been discharged. 01/17/19 on evaluation today patient appears to be doing very well in regard to her wounds in general. She's been making excellent progress at this time. Fortunately there's no sign of infection at this time either. No fevers, chills, nausea, or vomiting noted at this time. The biggest issue is still  her left lateral malleolus where it appears to be doing well and is getting smaller but still shows a small corner where this is deeper and goes down into what appears to be the joint space. Nonetheless this is taking much longer to heal although it still looks  better in smaller than previous evaluations. 01/24/19 on evaluation today patient's wounds actually appear to be doing rather well in general overall. She did require some sharp debridement in regard to the right great toe but everything else appears to be doing excellent no debridement was even necessary. No fevers, chills, nausea, or vomiting noted at this time. 01/31/19 on evaluation today patient actually appears to be doing much better in regard to her left foot wound on the heel as well as the ankle. The right great toe appears to be a little bit worse today this had callous over and trapped a lot of fluid underneath. Fortunately there's no signs of infection at any site which is great news. 02/07/19 on evaluation today patient actually appears to be doing decently well in regard to all of her ulcers at this point. No sharp debridement was required she is a little bit of hyper granulation in regard to the left lateral ankle as well as the left heel but the hill itself is almost completely healed which is excellent news. Overall been very pleased in this regard. 02/14/19 on evaluation today patient actually appears to be doing very well in regard to her ulcers on the right first toe, left lateral malleolus, and left heel. In fact the heel is almost completely healed at this point. The patient does not show any signs of infection which is good news. Overall very pleased with how things have progressed. 04/18/19 Telehealth Evaluation During the COVID-19 National Emergency: Verbal Consent: Obtained from patient Allergies: reviewed and the active list is current. Medication changes: patient has no current medication changes. COVID-19  Screening: 1. Have you traveled internationally or on a cruise ship in the last 14 dayso No 2. Have you had contact with someone with or under investigation for COVID-19o No 3. Have you had a fever, cough, sore throat, or experiencing shortness of breatho No on evaluation today actually did have a visit with this patient through a telehealth encounter with her home health nurse. Subsequently it was noted that the patient actually appears to be doing okay in regard to her wounds both the right great toe as well as the left lateral malleolus have shown signs of improvement although this in your theme around the left lateral malleolus there eschar coverings for both locations. The question is whether or not they are actually close and whether or not home health needs to discharge the patient or not. Nonetheless my concern is this point obviously is that without actually seeing her and being able to evaluate this directly I cannot ensure that she is completely healed which is the question that I'm being asked. 04/22/19 on evaluation today patient presents for her first evaluation since last time I saw her which was actually February 14, 2019. I did do a telehealth visit last week in which point it was questionable whether or not she may be healed and had to bring her in today for confirmation. With that being said she does seem to be doing quite well at this point which is good news. There does not appear to be any drainage in the deed I believe her wounds may be healed. Readmission: [name], [name] ([id number]) 09/04/2019 on evaluation today patient appears to be doing unfortunately somewhat more poorly in regard to her left foot ulcer secondary to a wound that began on 08/21/2019 at least when she first noticed this. Fortunately she has not had any evidence of active infection at this time. Systemically. I also  do not necessarily see any evidence of infection at the blister/wound site on the first  metatarsal head plantar aspect. This almost appears to be something that may have just rubbed inappropriately causing this to breakdown. They did not want a wait too long to come in to be seen as again she had significant issues in the past with wounds that took quite a while to heal in fact it was close to 2 years. Nonetheless this does not appear to be quite that bad but again we do need to remove some of the necrotic tissue from the surface of the wound to tell exactly the extent. She does not appear to have any significant arterial disease at this point and again her last ABIs and TBI's are recorded above in the alert section her left ABI was 1.27 with a TBI of 0.72 to the right ABI 1.08 with a TBI of 0.39. Other than this the patient has been doing quite well since I last saw her and that was in May 2020. Electronic Signature(s) Signed: 09/04/2019 3:16:51 PM By: Worthy Keeler PA-C Entered By: Worthy Keeler on 09/04/2019 15:16:51 Galindez, [name] ([id number]) -------------------------------------------------------------------------------- Physical Exam Details Patient Name: [name] Date of Service: 09/04/2019 9:45 AM Medical Record Number: [id number] Patient Account Number: [id number] Date of Birth/Sex: [birth year] (85 y.o. F) Treating RN: Army Melia Primary Care Provider: Grayland Ormond Other Clinician: Referring Provider: Referral, Self Treating Provider/Extender: STONE III, HOYT Weeks in Treatment: 0 Constitutional sitting or standing blood pressure is within target range for patient.. pulse regular and within target range for patient.Marland Kitchen respirations regular, non-labored and within target range for patient.Marland Kitchen temperature within target range for patient.. Well- nourished and well-hydrated in no acute distress. Eyes conjunctiva clear no eyelid edema noted. pupils equal round and reactive to light and accommodation. Ears, Nose, Mouth, and Throat no gross abnormality of  ear auricles or external auditory canals. normal hearing noted during conversation. mucus membranes moist. Respiratory normal breathing without difficulty. clear to auscultation bilaterally. Cardiovascular regular rate and rhythm with normal S1, S2. 1+ dorsalis pedis/posterior tibialis pulses. no clubbing, cyanosis, significant edema, <3 sec cap refill. Gastrointestinal (GI) soft, non-tender, non-distended, +BS. no ventral hernia noted. Musculoskeletal normal gait and posture. no significant deformity or arthritic changes, no loss or range of motion, no clubbing. Psychiatric this patient is able to make decisions and demonstrates good insight into disease process. Alert and Oriented x 3. pleasant and cooperative. Notes Upon inspection patient's wound bed actually showed signs of good granulation at this time. Fortunately there does not appear to be any evidence of active infection. The patient has been tolerating just a protective dressing over this. She has not been wearing her offloading shoe that she had previous although honestly I think that it was not really is offloading as much as it was just a postop shoe to help protect her toes. Nonetheless at this time I think we may want to consider a peg assist offloading shoe to help her at this point. She does ambulate using a walker pretty much all the time which is good news. Patient's wound bed did require significant sharp debridement to clear away some of the necrotic tissue/callus from the surface of the wound. She tolerated that today without complication and post debridement the wound bed appears to be doing much better which is great news. Overall I am very pleased with how things seem today. Especially post debridement. The wound is very superficial and I think has a  good chance that healing quickly if we can keep pressure off of the area. Electronic Signature(s) Signed: 09/04/2019 3:20:59 PM By: Worthy Keeler PA-C Entered By:  Worthy Keeler on 09/04/2019 15:20:59 Alas, [name] ([id number]) -------------------------------------------------------------------------------- Physician Orders Details Patient Name: [name] Date of Service: 09/04/2019 9:45 AM Medical Record Number: [id number] Patient Account Number: [id number] Date of Birth/Sex: Sep 01, 1923 (85 y.o. F) Treating RN: Army Melia Primary Care Provider: Grayland Ormond Other Clinician: Referring Provider: Referral, Self Treating Provider/Extender: STONE III, HOYT Weeks in Treatment: 0 Verbal / Phone Orders: No Diagnosis Coding ICD-10 Coding Code Description I89.0 Lymphedema, not elsewhere classified I87.2 Venous insufficiency (chronic) (peripheral) I73.89 Other specified peripheral vascular diseases L97.522 Non-pressure chronic ulcer of other part of left foot with fat layer exposed Wound Cleansing Wound #6 Left,Plantar Metatarsal head first o Clean wound with Normal Saline. o Cleanse wound with mild soap and water Skin Barriers/Peri-Wound Care Wound #6 Left,Plantar Metatarsal head first o Skin Prep - periwound Primary Wound Dressing Wound #6 Left,Plantar Metatarsal head first o Silver Collagen Secondary Dressing Wound #6 Left,Plantar Metatarsal head first o Boardered Foam Dressing Dressing Change Frequency Wound #6 Left,Plantar Metatarsal head first o Dressing is to be changed Monday and Thursday. - Thursday in office, Monday by Home Health Follow-up Appointments Wound #6 Left,Plantar Metatarsal head first o Return Appointment in 1 week. Off-Loading Wound #6 Left,Plantar Metatarsal head first o Open toe surgical shoe with peg assist. Home Health Wound #6 Left,Plantar Metatarsal head first o [place] for [place] Nurse may visit PRN to address patientos wound care needs. LAETITIA, [name] ([id number]) o FACE TO FACE ENCOUNTER: MEDICARE and MEDICAID PATIENTS: I  certify that this patient is under my care and that I had a face-to-face encounter that meets the physician face-to-face encounter requirements with this patient on this date. The encounter with the patient was in whole or in part for the following MEDICAL CONDITION: (primary reason for [place]) MEDICAL NECESSITY: I certify, that based on my findings, NURSING services are a medically necessary home health service. HOME BOUND STATUS: I certify that my clinical findings support that this patient is homebound (i.e., Due to illness or injury, pt requires aid of supportive devices such as crutches, cane, wheelchairs, walkers, the use of special transportation or the assistance of another person to leave their place of residence. There is a normal inability to leave the home and doing so requires considerable and taxing effort. Other absences are for medical reasons / religious services and are infrequent or of short duration when for other reasons). o If current dressing causes regression in wound condition, may D/C ordered dressing product/s and apply Normal Saline Moist Dressing daily until next [place] / Other MD appointment. [place] of regression in wound condition at [phone]. o Please direct any NON-WOUND related issues/requests for orders to patient's Primary Care Physician Electronic Signature(s) Signed: 09/04/2019 5:01:07 PM By: Army Melia Signed: 09/04/2019 5:50:00 PM By: Worthy Keeler PA-C Entered By: Army Melia on 09/04/2019 10:33:24 Salonga, [name] ([id number]) -------------------------------------------------------------------------------- Problem List Details Patient Name: [name] Date of Service: 09/04/2019 9:45 AM Medical Record Number: [id number] Patient Account Number: [id number] Date of Birth/Sex: 1923-05-07 (85 y.o. F) Treating RN: Army Melia Primary Care Provider: Grayland Ormond Other Clinician: Referring  Provider: Referral, Self Treating Provider/Extender: Melburn Hake, HOYT Weeks in Treatment: 0 Active Problems ICD-10 Evaluated Encounter Code Description Active Date Today Diagnosis I89.0 Lymphedema, not elsewhere classified  09/04/2019 No Yes I87.2 Venous insufficiency (chronic) (peripheral) 09/04/2019 No Yes I73.89 Other specified peripheral vascular diseases 09/04/2019 No Yes L97.522 Non-pressure chronic ulcer of other part of left foot with fat 09/04/2019 No Yes layer exposed Inactive Problems Resolved Problems Electronic Signature(s) Signed: 09/04/2019 10:15:38 AM By: Worthy Keeler PA-C Previous Signature: 09/04/2019 9:45:50 AM Version By: Worthy Keeler PA-C Entered By: Worthy Keeler on 09/04/2019 10:15:38 [place]brow, [name] ([id number]) -------------------------------------------------------------------------------- Progress Note Details Patient Name: [name] Date of Service: 09/04/2019 9:45 AM Medical Record Number: [id number] Patient Account Number: [id number] Date of Birth/Sex: [birth year] (85 y.o. 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 Dr. Amalia Hailey the podiatrist saw her on 07/20/2017 and sent her to the wound center for possible hyperbaric oxygen therapy. past medical history of peripheral vascular disease, varicose veins, status post appendectomy, basal cell carcinoma excision from the left leg, cholecystectomy, pacemaker placement, right lower extremity angiography done by Dr. dew in March 2017 with placement of a  stent. there is also note of a successful ablation of the right small saphenous vein done which was reviewed by ultrasound on 10/24/2016. the patient had a right small saphenous vein ablation done on 10/20/2016. The patient has never been a smoker. She has been seen by Dr. Corene Cornea dew the vascular surgeon who most recently saw her on 06/15/2017 for evaluation of ongoing problems with right leg swelling. She had a lower extremity arterial duplex examination done(02/13/17) which showed patent distal right superficial femoral artery stent and above-the-knee popliteal stent without evidence of restenosis. The ABI was more than 1.3 on the right and more than 1.3 on the left. This was consistent with noncompressible arteries due to medial calcification. The right great toe pressure and PPG waveforms are within normal limits and the left great toe pressure and PPG waveforms are decreased. he recommended she continue to wear her compression stockings and continue with elevation. She is scheduled to have a noninvasive arterial study in the near future 08/16/2017 -- had a lower extremity arterial duplex examination done which showed patent distal right superficial femoral artery stent and above-the-knee popliteal stent without evidence of restenosis. The ABI was more than 1.3 on the right and more than 1.3 on the left. This was consistent with noncompressible arteries due to medial calcification. The right great toe pressure and PPG waveforms are within normal limits and the left great toe pressure and PPG waveforms are decreased. the x-ray of the right ankle has not yet been done 08/24/2017 -- had a right ankle x-ray -- IMPRESSION:1. No fracture, bone lesion or evidence of osteomyelitis. 2. Lateral soft tissue swelling with a soft tissue ulcer. she has not yet seen the vascular surgeon for review 08/31/17 on evaluation today patient's wound appears to be showing signs of improvement. She still with her  appointment with vascular in order to review her results of her vascular study and then determine if any intervention would be recommended at that time. No fevers, chills, nausea, or vomiting noted at this time. She has been tolerating the dressing changes without complication. 09/28/17 on evaluation today patient's wound appears to show signs of good  improvement in regard to the granulation tissue which is surfacing. There is still a layer of slough covering the wound and the posterior portion is still significantly deeper than the anterior nonetheless there has been some good sign of things moving towards the better. She is going to go back to Dr. dew for reevaluation to ensure her blood flow is still appropriate. That will be before her next evaluation with [place] next week. No fevers, chills, nausea, or vomiting noted at this time. Patient does have some discomfort rated to be a 3-4/10 depending on activity specifically cleansing the wound makes it worse. 10/05/2017 -- the patient was seen by Dr. Lucky Cowboy last week and noninvasive studies showed a normal right ABI with brisk VANDI, GUARENTE. ([id number]) triphasic waveforms consistent with no arterial insufficiency including normal digital pressures. The duplex showed a patent distal right SFA stent and the proximal SFA was also normal. He was pleased with her test and thought she should have enough of perfusion for normal wound healing. He would see her back in 6 months time. 12/21/17 on evaluation today patient appears to be doing fairly well in regard to her right lateral ankle wound. Unfortunately the main issue that she is expansion at this point is that she is having some issues with what appears to be some cellulitis in the right anterior shin. She has also been noting a little bit of uncomfortable feeling especially last night and her ankle area. I'm afraid that she made the developing a little bit of an infection. With that being said I think  it is in the early stages. 12/28/17 on evaluation today patient's ankle appears to be doing excellent. She's making good progress at this point the cellulitis seems to have improved after last week's evaluation. Overall she is having no significant discomfort which is excellent news. She does have an appointment with Dr. dew on March 29, 2018 for reevaluation in regard to the stent he placed. She seems to have excellent blood flow in the right lower extremity. 01/19/12 on evaluation today patient's wound appears to be doing very well. In fact she does not appear to require debridement at this point, there's no evidence of infection, and overall from the standpoint of the wound she seems to be doing very well. With that being said I believe that it may be time to switch to different dressing away from the [facility] Dressing she tells me she does have a lot going on her friend actually passed away yesterday and she's also having a lot of issues with her husband this obviously is weighing heavy on her as far as your thoughts and concerns today. 01/25/18 on evaluation today patient appears to be doing fairly well in regard to her right lateral malleolus. She has been tolerating the dressing changes without complication. Overall I feel like this is definitely showing signs of improvement as far as how the overall appearance of the wound is there's also evidence of epithelium start to migrate over the granulation tissue. In general I think that she is progressing nicely as far as the wound is concerned. The only concern she really has is whether or not we can switch to every other week visits in order to avoid having as many appointments as her daughters have a difficult time getting her to her appointments as well as the patient's husband to his he is not doing very well at this point. 02/22/18 on evaluation today patient's right lateral malleolus ulcer appears to be  doing great. She has been tolerating  the dressing changes without complication. Overall you making excellent progress at this time. Patient is having no significant discomfort. 03/15/18 on evaluation today patient appears to be doing much more poorly in regard to her right lateral ankle ulcer at this point. Unfortunately since have last seen her her husband has passed just a few days ago is obviously weighed heavily on her her daughter also had surgery well she is with her today as usual. There does not appear to be any evidence of infection she does seem to have significant contusion/deep tissue injury to the right lateral malleolus which was not noted previous when I saw her last. It's hard to tell of exactly when this injury occurred although during the time she was spending the night in the hospital this may have been most likely. 03/22/18 on evaluation today patient appears to actually be doing very well in regard to her ulcer. She did unfortunately have a setback which was noted last week however the good news is we seem to be getting back on track and in fact the wound in the core did still have some necrotic tissue which will be addressed at this point today but in general I'm seeing signs that things are on the up and up. She is glad to hear this obviously she's been somewhat concerned that due to the how her wound digressed more recently. 03/29/18 on evaluation today patient appears to be doing fairly well in regard to her right lower extremity lateral malleolus ulcer. She unfortunately does have a new area of pressure injury over the inferior portion where the wound has opened up a little bit larger secondary to the pressure she seems to be getting. She does tell me sometimes when she sleeps at night that it actually hurts and does seem to be pushing on the area little bit more unfortunately. There does not appear to be any evidence of infection which is good news. She has been tolerating the dressing changes without  complication. She also did have some bruising in the left second and third toes due to the fact that she may have bump this or injured it although she has neuropathy so she does not feel she did move recently that may have been where this came from. Nonetheless there does not appear to be any evidence of infection at this time. 04/12/18 on evaluation today patient's wound on the right lateral ankle actually appears to be doing a little bit better with a lot of necrotic docking tissue centrally loosening up in clearing away. However she does have the beginnings of a deep tissue injury on the left lateral malleolus likely due to the fact we've been trying offload the right as much as we have. I think she may benefit from an assistive soft device to help with offloading and it looks like they're looking at one of the doughnut conditions that wraps around the lower leg to offload which I think will definitely do a good job. With that being said I think we definitely need to address this issue on the left before it becomes a wound. Patient is not having significant pain. AKEYLA, MUCHNIK ([id number]) 04/19/18 on evaluation today patient appears to be doing excellent in regard to the progress she's made with her right lateral ankle ulcer. The left ankle region which did show evidence of a deep tissue injury seems to be resolving there's little fluid noted underneath and a blister there's nothing open at  this point in time overall I feel like this is progressing nicely which is good news. She does not seem to be having significant discomfort at this point which is also good news. 04/25/18-She is here in follow up evaluation for bilateral lateral malleolar ulcers. The right lateral malleolus ulcer with pale subcutaneous tissue exposure, central area of ulcer with tendon/periosteum exposed. The left lateral malleolus ulcer now with central area of nonviable tissue, otherwise deep tissue injury. She is wearing  compression wraps to the left lower extremity, she will place the right lower extremity compression wraps on when she gets home. She will be out of town over the weekend and return next week and follow-up appointment. She completed her doxycycline this morning 05/03/18 on evaluation today patient appears to be doing very well in regard to her right lateral ankle ulcer in general. At least she's showing some signs of improvement in this regard. Unfortunately she has some additional injury to the left lateral malleolus region which appears to be new likely even over the past several days. Again this determination is based on the overall appearance. With that being said the patient is obviously frustrated about this currently. 05/10/18-She is here in follow-up evaluation for bilateral lateral malleolar ulcers. She states she has purchased offloading shoes/boots and they will arrive tomorrow. She was asked to bring them in the office at next week's appointment so her provider is aware of product being utilized. She continues to sleep on right or left side, she has been encouraged to sleep on her back. The right lateral malleolus ulcer is precariously close to peri-osteum; will order xray. The left lateral malleolus ulcer is improved. Will switch back to santyl; she will follow up next week. 05/17/18 on evaluation today patient actually appears to be doing very well in regard to her malleolus her ulcers compared to last time I saw them. She does not seem to have as much in the way of contusion at this point which is great news. With that being said she does continue to have discomfort and I do believe that she is still continuing to benefit from the offloading/pressure reducing boots that were recommended. I think this is the key to trying to get this to heal up completely. 05/24/18 on evaluation today patient actually appears to be doing worse at this point in time unfortunately compared to her last week's  evaluation. She is having really no increased pain which is good news unfortunately she does have more maceration in your theme and noted surrounding the right lateral ankle the left lateral ankle is not really is erythematous I do not see signs of the overt cellulitis on that side. Unfortunately the wounds do not seem to have shown any signs of improvement since the last evaluation. She also has significant swelling especially on the right compared to previous some of this may be due to infection however also think that she may be served better while she has these wounds by compression wrapping versus continuing to use the Juxta-Lite for the time being. Especially with the amount of drainage that she is experiencing at this point. No fevers, chills, nausea, or vomiting noted at this time. 05/31/18 on evaluation today patient appears to actually be doing better in regard to her right lateral lower extremity ulcer specifically on the malleolus region. She has been tolerating the antibiotic without complication. With that being said she still continues to have issues but a little bit of redness although nothing like she what she  was experiencing previous. She still continues to pressure to her ankle area she did get the problem on offloading boots unfortunately she will not wear them she states there too uncomfortable and she can'[name] get in and out of the bed. Nonetheless at this point her wounds seem to be continually getting worse which is not what we want I'm getting somewhat concerned about her progress and how things are going to proceed if we do not intervene in some way shape or form. I therefore had a very lengthy conversation today about offloading yet again and even made a specific suggestion for switching her to a memory foam mattress and even gave the information for a specific one that they could look at getting if it was something that they were interested in considering. She does not want  to be considered for a hospital bed air mattress although honestly insurance would not cover it that she does not have any wounds on her trunk. 06/14/18 on evaluation today both wounds over the bilateral lateral malleolus her ulcers appear to be doing better there's no evidence of pressure injury at this point. She did get the foam mattress for her bed and this does seem to have been extremely beneficial for her in my pinion. Her daughter states that she is having difficulty getting out of bed because of how soft it is. The patient also relates this to be. Nonetheless I do feel like she's actually doing better. Unfortunately right after and around the time she was getting the mattress she also sustained a fall when she got up to go pick up the phone and ended up injuring her right elbow she has 18 sutures in place. We are not caring for this currently although home health is going to be taking the sutures out shortly. Nonetheless this may be something that we need to evaluate going forward. It depends on how well it has or has not healed in the end. She also recently saw an orthopedic specialist for an injection in the right shoulder just before her fall unfortunately the fall seems to have worsened her pain. 06/21/18 on evaluation today patient appears to be doing about the same in regard to her lateral malleolus ulcers. Both appear to be just a little bit deeper but again we are clinging away the necrotic and dead tissue which I think is why this is progressing towards a deeper realm as opposed to improving from my measurement standpoint in that regard. Nonetheless she has been tolerating the dressing changes she absolutely hates the memory foam mattress topper that was obtained for her nonetheless [name], [name] ([id number]) I do believe this is still doing excellent as far as taking care of excess pressure in regard to the lateral malleolus regions. She in fact has no pressure injury that I  see whereas in weeks past it was week by week I was constantly seeing new pressure injuries. Overall I think it has been very beneficial for her. 07/03/18; patient arrives in my clinic today. She has deep punched out areas over her bilateral lateral malleoli. The area on the right has some more depth. We spent a lot of time today talking about pressure relief for these areas. This started when her daughter asked for a prescription for a memory foam mattress. I have never written a prescription for a mattress and I don'[name] think insurances would pay for that on an ordinary bed. In any case he came up that she has foam boots that she  refuses to wear. I would suggest going to these before any other offloading issues when she is in bed. They say she is meticulous about offloading this the rest of the day 07/10/18- She is seen in follow-up evaluation for bilateral, lateral malleolus ulcers. There is no improvement in the ulcers. She has purchased and is sleeping on a memory foam mattress/overlay, she has been using the offloading boots nightly over the past week. She has a follow up appointment with vascular medicine at the end of October, in my opinion this follow up should be expedited given her deterioration and suboptimal TBI results. We will order plain film xray of the left ankle as deeper structures are palpable; would consider having MRI, regardless of xray report(s). The ulcers will be treated with iodoflex/iodosorb, she is unable to safely change the dressings daily with santyl. 07/19/18 on evaluation today patient appears to be doing in general visually well in regard to her bilateral lateral malleolus ulcers. She has been tolerating the dressing changes without complication which is good news. With that being said we did have an x-ray performed on 07/12/18 which revealed a slight loosen see in the lateral portion of the distal left fibula which may represent artifact but underline lytic destruction  or osteomyelitis could not be excluded. MRI was recommended. With that being said we can see about getting the patient scheduled for an MRI to further evaluate this area. In fact we have that scheduled currently for August 20 19,019. 07/26/18 on evaluation today patient's wound on the right lateral ankle actually appears to be doing fairly well at this point in my pinion. She has made some good progress currently. With that being said unfortunately in regard to the left lateral ankle ulcer this seems to be a little bit more problematic at this time. In fact as I further evaluated the situation she actually had bone exposed which is the first time that's been the case in the bone appear to be necrotic. Currently I did review patient's note from Dr. Bunnie Domino office with [facility]. He stated that ABI was 1.26 on the right and 0.95 on the left with good waveforms. Her perfusion is stable not reduced from previous studies and her digital waveforms were pretty good particularly on the right. His conclusion upon review of the note was that there was not much she could do to improve her perfusion and he felt she was adequate for wound healing. His suggestion was that she continued to see [place] and consider a synthetic skin graft if there was no underlying infection. He plans to see her back in six months or as needed. 08/01/18 on evaluation today patient appears to be doing better in regard to her right lateral ankle ulcer. Her left lateral ankle ulcer is about the same she still has bone involvement in evidence of necrosis. There does not appear to be evidence of infection at this time On the right lateral lower extremity. I have started her on the Augmentin she picked this up and started this yesterday. This is to get her through until she sees infectious disease which is scheduled for 08/12/18. 08/06/18 on evaluation today patient appears to be doing rather well considering my discussion  with patient's daughter at the end of last week. The area which was marked where she had erythema seems to be improved and this is good news. With that being said overall the patient seems to be making good improvement when it comes to the  overall appearance of the right lateral ankle ulcer although this has been slow she at least is coming around in this regard. Unfortunately in regard to the left lateral ankle ulcer this is osteomyelitis based on the pathology report as well is bone culture. Nonetheless we are still waiting CT scan. Unfortunately the MRI we originally ordered cannot be performed as the patient is a pacemaker which I had overlooked. Nonetheless we are working on the CT scan approval and scheduling as of now. She did go to the hospital over the weekend and was placed on IV Cefzo for a couple of days. Fortunately this seems to have improved the erythema quite significantly which is good news. There does not appear to be any evidence of worsening infection at this time. She did have some bleeding after the last debridement therefore I did not perform any sharp debridement in regard to left lateral ankle at this point. Patient has been approved for a snap vac for the right lateral ankle. 08/14/18; the patient with wounds over her bilateral lateral malleoli. The area on the right actually looks quite good. Been using a snap back on this area. Healthy granulation and appears to be filling in. Unfortunately the area on the left is really problematic. 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 Also noted to have cellulitis. She saw Dr. Novella Olive of infectious disease today and was put on linezolid. We are able to verify this with her pharmacy. She is completed the Augmentin that she was already on. We've been using Iodoflex to this [name], [name] ([id number]) area 08/23/18 on evaluation today patient's wounds  both actually appear to be doing better compared to my prior evaluations. Fortunately she showing signs of good improvement in regard to the overall wound status especially where were using the snap vac on the right. In regard to left lateral malleolus the wound bed actually appears to be much cleaner than previously noted. I do not feel any phone directly probed during evaluation today and though there is tendon noted this does not appear to be necrotic it's actually fairly good as far as the overall appearance of the tendon is concerned. In general the wound bed actually appears to be doing significantly better than it was previous. Patient is currently in the care of [name] and I did review that note today. He actually has her on two weeks of linezolid and then following the patient will be on 1-2 months of Keflex. That is the plan currently. She has been on antibiotics therapy as prescribed by myself initially starting on July 30, 2018 and has been on that continuously up to this point. 08/30/18 on evaluation today patient actually appears to be doing much better in regard to her right lateral malleolus ulcer. She has been tolerating the dressing changes specifically the snap vac without complication although she did have some issues with the seal currently. Apparently there was some trouble with getting it to maintain over the past week past Sunday. Nonetheless overall the wound appears better in regard to the right lateral malleolus region. In regard to left lateral malleolus this actually show some signs of additional granulation although there still tendon noted in the base of the wound this appears to be healthy not necrotic in any way whatsoever. We are considering potentially using a snap vac for the left lateral malleolus as well the product wrap from [employer], [place], was present  in the clinic today we're going to see this patient I did have her come in with me after obtaining consent  from the patient and her daughter in order to look at the wound and see if there's any recommendation one way or another as to whether or not they felt the snapback could be beneficial for the left lateral malleolus region. But the conclusion was that it might be but that this is definitely a little bit deeper wound than what traditionally would be utilized for a snap vac. 09/06/18 on evaluation today patient actually appears to be doing excellent in my pinion in regard to both ankle ulcers. She has been tolerating the dressing changes without complication which is great news. Specifically we have been using the snap vac. In regard to the right ankle I'm not even sure that this is going to be necessary for today and following as the wound has filled in quite nicely. In regard to the left ankle I do believe that we're seeing excellent epithelialization from the edge as well as granulation in the central portion the tendon is still exposed but there's no evidence of necrotic bone and in general I feel like the patient has made excellent progress even compared to last week with just one week of the snap vac. 09/11/18; this is a patient who has wounds on her bilateral lateral malleoli. Initially both of these were deep stage IV wounds in the setting of chronic arterial insufficiency. She has been revascularized. As I understand think she been using snap vacs to both of these wounds however the area on the right became more superficial and currently she is only using it on the left. Using silver collagen on the right and silver collagen under the back on the left I believe 09/19/18 on evaluation today patient actually appears to be doing very well in regard to her lateral malleolus or ulcers bilaterally. She has been tolerating the dressing changes without complication. Fortunately there does not appear to be any evidence of infection at this time. Overall I feel like she is improving in an excellent manner  and I'm very pleased with the fact that everything seems to be turning towards the better for her. This has obviously been a long road. 09/27/18 on evaluation today patient actually appears to be doing very well in regard to her bilateral lateral malleolus ulcers. She has been tolerating the dressing changes without complication. Fortunately there does not appear to be any evidence of infection at this time which is also great news. No fevers, chills, nausea, or vomiting noted at this time. Overall I feel like she is doing excellent with the snap vac on the left malleolus. She had 40 mL of fluid collection over the past week. 10/04/18 on evaluation today patient actually appears to be doing well in regard to her bilateral lateral malleolus ulcers. She continues to tolerate the dressing changes without complication. One issue that I see is the snap vac on the left lateral malleolus which appears to have sealed off some fluid underlying this area and has not really allowed it to heal to the degree that I would like to see. For that reason I did suggest at this point we may want to pack a small piece of packing strip into this region to allow it to more effectively wick out fluid. 10/11/18 in general the patient today does not feel that she has been doing very well. She's been a little bit lethargic and subsequently is having  bodyaches as well according to what she tells me today. With that being said overall she has been concerned with the fact that something may be worsening although to be honest her wounds really have not been appearing poorly. She does have a new ulcer on her left heel unfortunately. This may be pressure related. Nonetheless it seems to me to have potentially started at least as a blister I do not see any evidence of deep tissue injury. In regard to the left ankle the snap vac still seems to be causing the ceiling off of the deeper part of the wound which is in turn trapping fluid.  I'm not extremely pleased with the overall appearance as far as progress from last week to this week therefore I'm gonna discontinue [name], [name] ([id number]) the snap vac at this point. 10/18/18 patient unfortunately this point has not been feeling well for the past several days. She was seen by Grayland Ormond her primary care provider who is a Librarian, academic at [facility]. Subsequently she states that she's been very weak and generally feeling malaise. No fevers, chills, nausea, or vomiting noted at this time. With that being said bloodwork was performed at the PCP office on the 11th of this month which showed a white blood cell count of 10.7. This was repeated today and shows a white blood cell count of 12.4. This does show signs of worsening. Coupled with the fact that she is feeling worse and that her left ankle wound is not really showing signs of improvement I feel like this is an indication that the osteomyelitis is likely exacerbating not improving. Overall I think we may also want to check her C-reactive protein and sedimentation rate. Actually did call [facility] office this afternoon while the patient was in the office here with me. Subsequently based on the findings we discussed treatment possibilities and I think that it is appropriate for [place] to go ahead and initiate treatment with doxycycline which I'm going to do. Subsequently he did agree to see about adding a CRP and sedimentation rate to her orders. If that has not already been drawn to where they can run it they will contact the patient she can come back to have that check. They are in agreement with plan as far as the patient and her daughter are concerned. Nonetheless also think we need to get in touch with Dr. Henreitta Leber office to see about getting the patient scheduled with him as soon as possible. 11/08/18 on evaluation today patient presents for follow-up concerning her bilateral foot and ankle ulcers. I  did do an extensive review of her chart in epic today. Subsequently she was seen by [name] he did initiate Cefepime IV antibiotic therapy. Subsequently she had some issues with her PICC line this had to be removed because it was coiled and then replaced. Fortunately that was now settled. Unfortunately she has continued have issues with her left heel as well as the issues that she is experiencing with her bilateral lateral malleolus regions. I do believe however both areas seem to be doing a little bit better on evaluation today which is good news. No fevers, chills, nausea, or vomiting noted at this time. She actually has an angiogram schedule with Dr. dew on this coming Monday, November 11, 2018. Subsequently the patient states that she is feeling much better especially than what she was roughly 2 weeks ago. She actually had to cancel an appointment because she was feeling so poorly. No  fevers, chills, nausea, or vomiting noted at this time. 11/15/18 on evaluation today patient actually is status post having had her angiogram with Dr. dew Monday, four days ago. 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 He had to go and work on several areas of the vasculature fortunately he was able to obtain no more than a 30% residual stenosis throughout post procedure. I reviewed this note today. I think this will definitely help with healing at this time. Fortunately there does not appear to be any signs of infection and I do feel like ratio already has a better appearance to it. 11/22/18 upon evaluation today patient actually appears to be doing very well in regard to her wounds in general. The right lateral malleolus looks excellent the heel looks better in the left lateral malleolus also appears to be doing a little better. With that being said the right second toe actually appears to be open and training we been watching this is been dry and stable but now is open. 12/03/2018  Seen today for follow-up and management of multiple bilateral lower extremity wounds. New pressure injury of the great toe which is closed at this time. Wound of the right distal second toe appears larger today with deep undermining and a pocket of fluid present within the undermining region. Left and right malleolus is wounds are stable today with no signs and symptoms of infection.Denies any needs or concerns during exam today. 12/13/18 on evaluation today patient appears to be doing somewhat better in regard to her left heel ulcer. She also seems to be completely healed in regard to the right lateral malleolus ulcer. The left malleolus ulcer is smaller what unfortunately the wounds which are new over the first and second toes of the right foot are what are most concerning at this point especially the second. Both areas did require sharp debridement today. 12/20/18 on evaluation today patient's wound actually appears to be doing better in regard to left lateral ankle and her right lateral ankle continues to remain healed. The hill ulcer on the left is improved. She does have improvement noted as well in regard to both toe ulcers. Overall I'm very pleased in this regard. No fevers, chills, nausea, or vomiting noted at this time. 12/23/18 on evaluation today patient is seen after she had her toenails trimmed at the podiatrist office due to issues with her right great toe. There was what appeared to be dark eschar on the surface of the wound which had her in the podiatrist concerned. Nonetheless as I remember that during the last office visit I had utilize silver nitrate of this area I was much less concerned about the situation. Subsequently I was able to clean off much of this tissue without any complication today. This does not appear to show any signs of infection and actually look somewhat better compared to last time post debridement. Her second toe on the right foot actually had callous over and  there did appear still be some fluid underneath this that would require debridement today. [name], [name] ([id number]) 12/27/18 on evaluation today patient actually appears to be showing signs of improvement at all locations. Even the left lateral ankle although this is not quite as great as the other sites. Fortunately there does not appear to be any signs of infection at this time and both of her toes on the right foot seem to be showing signs of improvement which is good news and very  pleased in this regard. 01/03/19 on evaluation today patient appears to be doing better for the most part in regard to her wounds in particular. There does not appear to be any evidence of infection at this time which is good news. Fortunately there is no sign of really worsening anywhere except for the right great toe which she does have what appears to be a bruise/deep tissue injury which is very superficial and already resolving. I'm not sure where this came from I questioned her extensively and she does not recall what may have happened with this. Other than that the patient seems to be doing well even the left lateral ankle ulcer looks good and is getting smaller. 01/10/19 on evaluation today patient appears to be doing well in regard to her left heel wound and both of her toe wounds. Overall I feel like there is definitely improvement here and I'm happy in that regard. With that being said unfortunately she is having issues with the left lateral malleolus ulcer which unfortunately still has a lot of depth to it. This is gonna be a very difficult wound for [place] to be able to truly get to heal. I may want to consider some type of skin substitute to see if this would be of benefit for her. I'll discuss this with her more the next visit most likely. This was something I thought about more at the end of the visit when I was Artie out of the room and the patient had been discharged. 01/17/19 on evaluation today  patient appears to be doing very well in regard to her wounds in general. She's been making excellent progress at this time. Fortunately there's no sign of infection at this time either. No fevers, chills, nausea, or vomiting noted at this time. The biggest issue is still her left lateral malleolus where it appears to be doing well and is getting smaller but still shows a small corner where this is deeper and goes down into what appears to be the joint space. Nonetheless this is taking much longer to heal although it still looks better in smaller than previous evaluations. 01/24/19 on evaluation today patient's wounds actually appear to be doing rather well in general overall. She did require some sharp debridement in regard to the right great toe but everything else appears to be doing excellent no debridement was even necessary. No fevers, chills, nausea, or vomiting noted at this time. 01/31/19 on evaluation today patient actually appears to be doing much better in regard to her left foot wound on the heel as well as the ankle. The right great toe appears to be a little bit worse today this had callous over and trapped a lot of fluid underneath. Fortunately there's no signs of infection at any site which is great news. 02/07/19 on evaluation today patient actually appears to be doing decently well in regard to all of her ulcers at this point. No sharp debridement was required she is a little bit of hyper granulation in regard to the left lateral ankle as well as the left heel but the hill itself is almost completely healed which is excellent news. Overall been very pleased in this regard. 02/14/19 on evaluation today patient actually appears to be doing very well in regard to her ulcers on the right first toe, left lateral malleolus, and left heel. In fact the heel is almost completely healed at this point. The patient does not show any signs of infection which is good news. Overall  very pleased with  how things have progressed. 04/18/19 Telehealth Evaluation During the COVID-19 National Emergency: Verbal Consent: Obtained from patient Allergies: reviewed and the active list is current. Medication changes: patient has no current medication changes. COVID-19 Screening: 1. Have you traveled internationally or on a cruise ship in the last 14 dayso No 2. Have you had contact with someone with or under investigation for COVID-19o No 3. Have you had a fever, cough, sore throat, or experiencing shortness of breatho No on evaluation today actually did have a visit with this patient through a telehealth encounter with her home health nurse. Subsequently it was noted that the patient actually appears to be doing okay in regard to her wounds both the right great toe as well as the left lateral malleolus have shown signs of improvement although this in your theme around the left lateral malleolus there eschar coverings for both locations. The question is whether or not they are actually close and whether or not home health needs to discharge the patient or not. Nonetheless my concern is this point obviously is that without actually seeing her and being able to evaluate this directly I cannot ensure that she is completely healed which is the question that I'm being asked. 04/22/19 on evaluation today patient presents for her first evaluation since last time I saw her which was actually March 13, [name], [name]. ([id number]) 2020. I did do a telehealth visit last week in which point it was questionable whether or not she may be healed and had to bring her in today for confirmation. With that being said she does seem to be doing quite well at this point which is good news. There does not appear to be any drainage in the deed I believe her wounds may be healed. Readmission: 09/04/2019 on evaluation today patient appears to be doing unfortunately somewhat more poorly in regard to her left foot  ulcer secondary to a wound that began on 08/21/2019 at least when she first noticed this. Fortunately she has not had any evidence of active infection at this time. Systemically. I also do not necessarily see any evidence of infection at the blister/wound site on the first metatarsal head plantar aspect. This almost appears to be something that may have just rubbed inappropriately causing this to breakdown. They did not want a wait too long to come in to be seen as again she had significant issues in the past with wounds that took quite a while to heal in fact it was close to 2 years. Nonetheless this does not appear to be quite that bad but again we do need to remove some of the necrotic tissue from the surface of the wound to tell exactly the extent. She does not appear to have any significant arterial disease at this point and again her last ABIs and TBI's are recorded above in the alert section her left ABI was 1.27 with a TBI of 0.72 to the right ABI 1.08 with a TBI of 0.39. Other than this the patient has been doing quite well since I last saw her and that was in May 2020. Patient History Information obtained from Patient. Allergies Sulfa (Sulfonamide Antibiotics), metronidazole, monistat Family History Cancer - Father,Siblings, Heart Disease - Siblings, No family history of Diabetes, Hereditary Spherocytosis, Hypertension, Kidney Disease, Lung Disease, Seizures, Stroke, Thyroid Problems, Tuberculosis. Social History Never smoker, Marital Status - Married, Alcohol Use - Never, Drug Use - No History, Caffeine Use - Rarely. Medical History Eyes Patient has  history of Cataracts - surgery Cardiovascular Patient has history of Congestive Heart Failure, Hypertension, Peripheral Arterial Disease Denies history of Angina, Arrhythmia, Coronary Artery Disease, Deep Vein Thrombosis, Hypotension, Myocardial Infarction, Peripheral Venous Disease, Phlebitis, Vasculitis Integumentary (Skin) Denies  history of History of Burn, History of pressure wounds Musculoskeletal Patient has history of Osteoarthritis Denies history of Gout, Rheumatoid Arthritis, Osteomyelitis Neurologic Patient has history of Neuropathy Denies history of Dementia, Quadriplegia, Paraplegia, Seizure Disorder Oncologic Denies history of Received Chemotherapy, Received Radiation Review of Systems (ROS) Eyes Denies complaints or symptoms of Dry Eyes, Vision Changes, Glasses / Contacts. Ear/Nose/Mouth/Throat Denies complaints or symptoms of Difficult clearing ears, Sinusitis. Hematologic/Lymphatic Denies complaints or symptoms of Bleeding / Clotting Disorders, Human Immunodeficiency Virus. Respiratory Denies complaints or symptoms of Chronic or frequent coughs, Shortness of Breath. LILOU, ERWIN ([id number]) Cardiovascular Complains or has symptoms of LE edema. Denies complaints or symptoms of Chest pain. Gastrointestinal Denies complaints or symptoms of Frequent diarrhea, Nausea, Vomiting. Endocrine Denies complaints or symptoms of Hepatitis, Thyroid disease, Polydypsia (Excessive Thirst). Genitourinary Denies complaints or symptoms of Kidney failure/ Dialysis, Incontinence/dribbling. Immunological Denies complaints or symptoms of Hives, Itching. Integumentary (Skin) Complains or has symptoms of Wounds, Swelling. Denies complaints or symptoms of Bleeding or bruising tendency, Breakdown. Musculoskeletal Denies complaints or symptoms of Muscle Pain, Muscle Weakness. Neurologic Denies complaints or symptoms of Numbness/parasthesias, Focal/Weakness. Psychiatric Denies complaints or symptoms of Anxiety, Claustrophobia. Objective Constitutional sitting or standing blood pressure is within target range for patient.. pulse regular and within target range for patient.Marland Kitchen respirations regular, non-labored and within target range for patient.Marland Kitchen temperature within target range for patient.. Well- nourished and  well-hydrated in no acute distress. Vitals Time Taken: 9:50 AM, Height: 65 in, Source: Measured, Weight: 155 lbs, Source: Measured, BMI: 25.8, Temperature: 99.0 F, Pulse: 85 bpm, Respiratory Rate: 18 breaths/min, Blood Pressure: 136/59 mmHg. Eyes conjunctiva clear no eyelid edema noted. pupils equal round and reactive to light and accommodation. Ears, Nose, Mouth, and Throat no gross abnormality of ear auricles or external auditory canals. normal hearing noted during conversation. mucus membranes moist. Respiratory normal breathing without difficulty. clear to auscultation bilaterally. Cardiovascular regular rate and rhythm with normal S1, S2. 1+ dorsalis pedis/posterior tibialis pulses. no clubbing, cyanosis, significant edema, Gastrointestinal (GI) soft, non-tender, non-distended, +BS. no ventral hernia noted. Musculoskeletal normal gait and posture. no significant deformity or arthritic changes, no loss or range of motion, no clubbing. TYKESHIA, SHAEFER ([id number]) Psychiatric this patient is able to make decisions and demonstrates good insight into disease process. Alert and Oriented x 3. pleasant and cooperative. General Notes: Upon inspection patient's wound bed actually showed signs of good granulation at this time. Fortunately there does not appear to be any evidence of active infection. The patient has been tolerating just a protective dressing over this. She has not been wearing her offloading shoe that she had previous although honestly I think that it was not really is offloading as much as it was just a postop shoe to help protect her toes. Nonetheless at this time I think we may want to consider a peg assist offloading shoe to help her at this point. She does ambulate using a walker pretty much all the time which is good news. Patient's wound bed did require significant sharp debridement to clear away some of the necrotic tissue/callus from the surface of the wound. She  tolerated that today without complication and post debridement the wound bed appears to be doing much better which is great news. Overall I  am very pleased with how things seem today. Especially post debridement. The wound is very superficial and I think has a good chance that healing quickly if we can keep pressure off of the area. Integumentary (Hair, Skin) Wound #6 status is Open. Original cause of wound was Gradually Appeared. The wound is located on the Left,Plantar Metatarsal head first. The wound measures 0.9cm length x 0.6cm width x 0.1cm depth; 0.424cm^2 area and 0.042cm^3 volume. There is Fat Layer (Subcutaneous Tissue) Exposed exposed. There is no tunneling or undermining noted. There is a small amount of sanguinous drainage noted. The wound margin is indistinct and nonvisible. There is large (67-100%) red granulation within the wound bed. There is no necrotic tissue within the wound bed. General Notes: patient has a callus on her foot that has blistered and is draining Assessment Active Problems ICD-10 Lymphedema, not elsewhere classified Venous insufficiency (chronic) (peripheral) Other specified peripheral vascular diseases Non-pressure chronic ulcer of other part of left foot with fat layer exposed Procedures Wound #6 Pre-procedure diagnosis of Wound #6 is an Arterial Insufficiency Ulcer located on the Left,Plantar Metatarsal head first .Severity of Tissue Pre Debridement is: Fat layer exposed. There was a Excisional Skin/Subcutaneous Tissue Debridement with a total area of 8 sq cm performed by STONE III, HOYT E., PA-C. With the following instrument(s): Curette, Forceps, and Scissors to remove Viable and Non-Viable tissue/material. Material removed includes Callus, Subcutaneous Tissue, Slough, and Skin: Dermis after achieving pain control using Lidocaine. A time out was conducted at 10:19, prior to the start of the procedure. A Minimum amount of bleeding was controlled with  Pressure. The procedure was tolerated well. Post Debridement Measurements: 0.9cm length x 0.6cm width x 0.1cm depth; 0.042cm^3 volume. Character of Wound/Ulcer Post Debridement is stable. Severity of Tissue Post Debridement is: Fat layer exposed. Post procedure Diagnosis Wound #6: Same as Pre-Procedure Boteler, [name] ([id number]) Plan Wound Cleansing: Wound #6 Left,Plantar Metatarsal head first: Clean wound with Normal Saline. Cleanse wound with mild soap and water Skin Barriers/Peri-Wound Care: Wound #6 Left,Plantar Metatarsal head first: Skin Prep - periwound Primary Wound Dressing: Wound #6 Left,Plantar Metatarsal head first: Silver Collagen Secondary Dressing: Wound #6 Left,Plantar Metatarsal head first: Boardered Foam Dressing Dressing Change Frequency: Wound #6 Left,Plantar Metatarsal head first: Dressing is to be changed Monday and Thursday. - Thursday in office, Monday by Home Health Follow-up Appointments: Wound #6 Left,Plantar Metatarsal head first: Return Appointment in 1 week. Off-Loading: Wound #6 Left,Plantar Metatarsal head first: Open toe surgical shoe with peg assist. Home Health: Wound #6 Left,Plantar Metatarsal head first: [place] for [place] Nurse may visit PRN to address patient s wound care needs. FACE TO FACE ENCOUNTER: MEDICARE and MEDICAID PATIENTS: I certify that this patient is under my care and that I had a face-to-face encounter that meets the physician face-to-face encounter requirements with this patient on this date. The encounter with the patient was in whole or in part for the following MEDICAL CONDITION: (primary reason for [place]) MEDICAL NECESSITY: I certify, that based on my findings, NURSING services are a medically necessary home health service. HOME BOUND STATUS: I certify that my clinical findings support that this patient is homebound (i.e., Due to illness or injury, pt requires aid of  supportive devices such as crutches, cane, wheelchairs, walkers, the use of special transportation or the assistance of another person to leave their place of residence. There is a normal inability to leave the home and doing so requires considerable and taxing  effort. Other absences are for medical reasons / religious services and are infrequent or of short duration when for other reasons). If current dressing causes regression in wound condition, may D/C ordered dressing product/s and apply Normal Saline Moist Dressing daily until next [place] / Other MD appointment. [place] of regression in wound condition at [phone]. Please direct any NON-WOUND related issues/requests for orders to patient's Primary Care Physician 1. My suggestion at this time is going to be that we go ahead and initiate treatment with a silver collagen dressing as I think this would be a good choice for the patient as far as try to get this area to heal. 2. I am going to suggest a border foam dressing over top of this which I think would be an easy and yet effective dressing to use in order to help this area to heal more appropriately as well. 3. I am also going to suggest at this point that we have the patient utilize a peg assist offloading shoe in order to keep pressure off of the area I think that would be very beneficial with her wound the way it is currently. Also I believe that she does need to use her walker when getting around with this on just to be on the safe side and again she pretty much uses her walker all the time anyway. 4. We will set up home health come out and help her caring for her wound and the patient is in agreement with this plan. MANVIR, BUAN ([id number]) We will see patient back for reevaluation in 1 week here in the clinic. If anything worsens or changes patient will contact our office for additional recommendations. Electronic Signature(s) Signed:  09/04/2019 3:22:17 PM By: Worthy Keeler PA-C Entered By: Worthy Keeler on 09/04/2019 15:22:16 Niday, [name] ([id number]) -------------------------------------------------------------------------------- ROS/PFSH Details Patient Name: [name] Date of Service: 09/04/2019 9:45 AM Medical Record Number: [id number] Patient Account Number: [id number] Date of Birth/Sex: [birth year] (85 y.o. F) Treating RN: Montey Hora Primary Care Provider: Grayland Ormond Other Clinician: Referring Provider: Referral, Self Treating Provider/Extender: STONE III, HOYT Weeks in Treatment: 0 Information Obtained From Patient Eyes Complaints and Symptoms: Negative for: Dry Eyes; Vision Changes; Glasses / Contacts Medical History: Positive for: Cataracts - surgery Ear/Nose/Mouth/Throat Complaints and Symptoms: Negative for: Difficult clearing ears; Sinusitis Hematologic/Lymphatic Complaints and Symptoms: Negative for: Bleeding / Clotting Disorders; Human Immunodeficiency Virus Respiratory Complaints and Symptoms: Negative for: Chronic or frequent coughs; Shortness of Breath Cardiovascular Complaints and Symptoms: Positive for: LE edema Negative for: Chest pain Medical History: Positive for: Congestive Heart Failure; Hypertension; Peripheral Arterial Disease Negative for: Angina; Arrhythmia; Coronary Artery Disease; Deep Vein Thrombosis; Hypotension; Myocardial Infarction; Peripheral Venous Disease; Phlebitis; Vasculitis Gastrointestinal Complaints and Symptoms: Negative for: Frequent diarrhea; Nausea; Vomiting Endocrine Complaints and Symptoms: Negative for: Hepatitis; Thyroid disease; Polydypsia (Excessive Thirst) Genitourinary Liandra, [place] [name] ([id number]) Complaints and Symptoms: Negative for: Kidney failure/ Dialysis; Incontinence/dribbling Immunological Complaints and Symptoms: Negative for: Hives; Itching Integumentary (Skin) Complaints and Symptoms: Positive for:  Wounds; Swelling Negative for: Bleeding or bruising tendency; Breakdown Medical History: Negative for: History of Burn; History of pressure wounds Musculoskeletal Complaints and Symptoms: Negative for: Muscle Pain; Muscle Weakness Medical History: Positive for: Osteoarthritis Negative for: Gout; Rheumatoid Arthritis; Osteomyelitis Neurologic Complaints and Symptoms: Negative for: Numbness/parasthesias; Focal/Weakness Medical History: Positive for: Neuropathy Negative for: Dementia; Quadriplegia; Paraplegia; Seizure Disorder Psychiatric Complaints and Symptoms: Negative for: Anxiety; Claustrophobia Oncologic Medical History: Negative for: Received Chemotherapy;  Received Radiation HBO Extended History Items Eyes: Cataracts Immunizations Pneumococcal Vaccine: Received Pneumococcal Vaccination: Yes Implantable Devices None Family and Social History SHANTAVIA, HARTL ([id number]) Cancer: Yes - Father,Siblings; Diabetes: No; Heart Disease: Yes - Siblings; Hereditary Spherocytosis: No; Hypertension: No; Kidney Disease: No; Lung Disease: No; Seizures: No; Stroke: No; Thyroid Problems: No; Tuberculosis: No; Never smoker; Marital Status - Married; Alcohol Use: Never; Drug Use: No History; Caffeine Use: Rarely; Financial Concerns: No; Food, Clothing or Shelter Needs: No; Support System Lacking: No; Transportation Concerns: No Electronic Signature(s) Signed: 09/04/2019 5:10:54 PM By: Montey Hora Signed: 09/04/2019 5:50:00 PM By: Worthy Keeler PA-C Entered By: Montey Hora on 09/04/2019 09:40:02 Liguori, [name] ([id number]) -------------------------------------------------------------------------------- SuperBill Details Patient Name: [name] Date of Service: 09/04/2019 Medical Record Number: [id number] Patient Account Number: [id number] Date of Birth/Sex: September 02, 1923 (85 y.o. F) Treating RN: Army Melia Primary Care Provider: Grayland Ormond Other  Clinician: Referring Provider: Referral, Self Treating Provider/Extender: STONE III, HOYT Weeks in Treatment: 0 Diagnosis Coding ICD-10 Codes Code Description I89.0 Lymphedema, not elsewhere classified I87.2 Venous insufficiency (chronic) (peripheral) I73.89 Other specified peripheral vascular diseases L97.522 Non-pressure chronic ulcer of other part of left foot with fat layer exposed Facility Procedures CPT4 Code: [id number] Description: [place] VISIT-LEV 3 EST PT Modifier: Quantity: 1 CPT4 Code: [id number] Description: [id number] - DEB SUBQ TISSUE 20 SQ CM/< ICD-10 Diagnosis Description L97.522 Non-pressure chronic ulcer of other part of left foot with fat Modifier: layer exposed Quantity: 1 Physician Procedures CPT4 [id number] Description: [id number] - WC PHYS LEVEL 4 - EST PT ICD-10 Diagnosis Description I89.0 Lymphedema, not elsewhere classified I87.2 Venous insufficiency (chronic) (peripheral) I73.89 Other specified peripheral vascular diseases L97.522 Non-pressure chronic ulcer  of other part of left foot with fat Modifier: 25 layer exposed Quantity: 1 CPT4 Code: [id number] Description: [id number] - WC PHYS SUBQ TISS 20 SQ CM ICD-10 Diagnosis Description L97.522 Non-pressure chronic ulcer of other part of left foot with fat Modifier: layer exposed Quantity: 1 Electronic Signature(s) Signed: 09/04/2019 3:22:34 PM By: Worthy Keeler PA-C Entered By: Worthy Keeler on 09/04/2019 15:22:34

## 2019-09-11 ENCOUNTER — Encounter: Payer: Medicare Other | Admitting: Physician Assistant

## 2019-09-11 ENCOUNTER — Other Ambulatory Visit: Payer: Self-pay

## 2019-09-11 DIAGNOSIS — L97522 Non-pressure chronic ulcer of other part of left foot with fat layer exposed: Secondary | ICD-10-CM | POA: Diagnosis not present

## 2019-09-11 NOTE — Progress Notes (Addendum)
JYAH, BONNET ([id number]) Visit Report for 09/11/2019 Chief Complaint Document Details Patient Name: CYRENITY, AYLER. Date of Service: 09/11/2019 3:15 PM Medical Record Number: [id number] Patient Account Number: [id number] Date of Birth/Sex: 15-Jan-1923 (85 y.o. F) Treating RN: Army Melia Primary Care Provider: Grayland Ormond Other Clinician: Referring Provider: Grayland Ormond Treating Provider/Extender: Melburn Hake, Kasson Lamere Weeks in Treatment: 1 Information Obtained from: Patient Chief Complaint Left foot ulcer Electronic Signature(s) Signed: 09/11/2019 3:42:38 PM By: Worthy Keeler PA-C Entered By: Worthy Keeler on 09/11/2019 15:42:38 Kasik, [name] ([id number]) -------------------------------------------------------------------------------- HPI Details Patient Name: [name] Date of Service: 09/11/2019 3:15 PM Medical Record Number: [id number] Patient Account Number: [id number] Date of Birth/Sex: 1923/04/06 (85 y.o. 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 Dr. Amalia Hailey the podiatrist saw her on 07/20/2017 and sent her to the wound center for possible hyperbaric oxygen therapy. past medical history of peripheral vascular disease, varicose veins, status post appendectomy, basal cell carcinoma excision from the left leg, cholecystectomy, pacemaker placement, right lower extremity angiography done by Dr. dew in March 2017 with placement of a stent. there is also note of a successful ablation of the right small saphenous vein done which was reviewed by ultrasound on 10/24/2016. the  patient had a right small saphenous vein ablation done on 10/20/2016. The patient has never been a smoker. She has been seen by Dr. Corene Cornea dew the vascular surgeon who most recently saw her on 06/15/2017 for evaluation of ongoing problems with right leg swelling. She had a lower extremity arterial duplex examination done(02/13/17) which showed patent distal right superficial femoral artery stent and above-the-knee popliteal stent without evidence of restenosis. The ABI was more than 1.3 on the right and more than 1.3 on the left. This was consistent with noncompressible arteries due to medial calcification. The right great toe pressure and PPG waveforms are within normal limits and the left great toe pressure and PPG waveforms are decreased. he recommended she continue to wear her compression stockings and continue with elevation. She is scheduled to have a noninvasive arterial study in the near future 08/16/2017 -- had a lower extremity arterial duplex examination done which showed patent distal right superficial femoral artery stent and above-the-knee popliteal stent without evidence of restenosis. The ABI was more than 1.3 on the right and more than 1.3 on the left. This was consistent with noncompressible arteries due to medial calcification. The right great toe pressure and PPG waveforms are within normal limits and the left great toe pressure and PPG waveforms are decreased. the x-ray of the right ankle has not yet been done 08/24/2017 -- had a right ankle x-ray -- IMPRESSION:1. No fracture, bone lesion or evidence of osteomyelitis. 2. Lateral soft tissue swelling with a soft tissue ulcer. she has not yet seen the vascular surgeon for review 08/31/17 on evaluation today patient's wound appears to be showing signs of improvement. She still with her appointment with vascular in order to review her results of her vascular study and then determine if any intervention would be recommended at that  time. No fevers, chills, nausea, or vomiting noted at this time. She has been tolerating the dressing changes without complication. 09/28/17 on  evaluation today patient's wound appears to show signs of good improvement in regard to the granulation tissue which is surfacing. There is still a layer of slough covering the wound and the posterior portion is still significantly deeper than the anterior nonetheless there has been some good sign of things moving towards the better. She is going to go back to Dr. dew for reevaluation to ensure her blood flow is still appropriate. That will be before her next evaluation with [place] next week. No fevers, chills, nausea, or vomiting noted at this time. Patient does have some discomfort rated to be a 3-4/10 depending on activity specifically cleansing the wound makes it worse. 10/05/2017 -- the patient was seen by Dr. Lucky Cowboy last week and noninvasive studies showed a normal right ABI with brisk triphasic waveforms consistent with no arterial insufficiency including normal digital pressures. The duplex showed a patent distal right SFA stent and the proximal SFA was also normal. He was pleased with her test and thought she should have enough of perfusion for normal wound healing. He would see her back in 6 months time. 12/21/17 on evaluation today patient appears to be doing fairly well in regard to her right lateral ankle wound. Unfortunately the main issue that she is expansion at this point is that she is having some issues with what appears to be some cellulitis in the JAELIANA, GLAZER. ([id number]) right anterior shin. She has also been noting a little bit of uncomfortable feeling especially last night and her ankle area. I'm afraid that she made the developing a little bit of an infection. With that being said I think it is in the early stages. 12/28/17 on evaluation today patient's ankle appears to be doing excellent. She's making good progress at this point  the cellulitis seems to have improved after last week's evaluation. Overall she is having no significant discomfort which is excellent news. She does have an appointment with Dr. dew on March 29, 2018 for reevaluation in regard to the stent he placed. She seems to have excellent blood flow in the right lower extremity. 01/19/12 on evaluation today patient's wound appears to be doing very well. In fact she does not appear to require debridement at this point, there's no evidence of infection, and overall from the standpoint of the wound she seems to be doing very well. With that being said I believe that it may be time to switch to different dressing away from the [facility] Dressing she tells me she does have a lot going on her friend actually passed away yesterday and she's also having a lot of issues with her husband this obviously is weighing heavy on her as far as your thoughts and concerns today. 01/25/18 on evaluation today patient appears to be doing fairly well in regard to her right lateral malleolus. She has been tolerating the dressing changes without complication. Overall I feel like this is definitely showing signs of improvement as far as how the overall appearance of the wound is there's also evidence of epithelium start to migrate over the granulation tissue. In general I think that she is progressing nicely as far as the wound is concerned. The only concern she really has is whether or not we can switch to every other week visits in order to avoid having as many appointments as her daughters have a difficult time getting her to her appointments as well as the patient's husband to his he is not doing very well at this point. 02/22/18 on  evaluation today patient's right lateral malleolus ulcer appears to be doing great. She has been tolerating the dressing changes without complication. Overall you making excellent progress at this time. Patient is having no  significant discomfort. 03/15/18 on evaluation today patient appears to be doing much more poorly in regard to her right lateral ankle ulcer at this point. Unfortunately since have last seen her her husband has passed just a few days ago is obviously weighed heavily on her her daughter also had surgery well she is with her today as usual. There does not appear to be any evidence of infection she does seem to have significant contusion/deep tissue injury to the right lateral malleolus which was not noted previous when I saw her last. It's hard to tell of exactly when this injury occurred although during the time she was spending the night in the hospital this may have been most likely. 03/22/18 on evaluation today patient appears to actually be doing very well in regard to her ulcer. She did unfortunately have a setback which was noted last week however the good news is we seem to be getting back on track and in fact the wound in the core did still have some necrotic tissue which will be addressed at this point today but in general I'm seeing signs that things are on the up and up. She is glad to hear this obviously she's been somewhat concerned that due to the how her wound digressed more recently. 03/29/18 on evaluation today patient appears to be doing fairly well in regard to her right lower extremity lateral malleolus ulcer. She unfortunately does have a new area of pressure injury over the inferior portion where the wound has opened up a little bit larger secondary to the pressure she seems to be getting. She does tell me sometimes when she sleeps at night that it actually hurts and does seem to be pushing on the area little bit more unfortunately. There does not appear to be any evidence of infection which is good news. She has been tolerating the dressing changes without complication. She also did have some bruising in the left second and third toes due to the fact that she may have bump this  or injured it although she has neuropathy so she does not feel she did move recently that may have been where this came from. Nonetheless there does not appear to be any evidence of infection at this time. 04/12/18 on evaluation today patient's wound on the right lateral ankle actually appears to be doing a little bit better with a lot of necrotic docking tissue centrally loosening up in clearing away. However she does have the beginnings of a deep tissue injury on the left lateral malleolus likely due to the fact we've been trying offload the right as much as we have. I think she may benefit from an assistive soft device to help with offloading and it looks like they're looking at one of the doughnut conditions that wraps around the lower leg to offload which I think will definitely do a good job. With that being said I think we definitely need to address this issue on the left before it becomes a wound. Patient is not having significant pain. 04/19/18 on evaluation today patient appears to be doing excellent in regard to the progress she's made with her right lateral ankle ulcer. The left ankle region which did show evidence of a deep tissue injury seems to be resolving there's little fluid noted underneath and  a blister there's nothing open at this point in time overall I feel like this is progressing nicely which is good news. She does not seem to be having significant discomfort at this point which is also good news. 04/25/18-She is here in follow up evaluation for bilateral lateral malleolar ulcers. The right lateral malleolus ulcer with pale subcutaneous tissue exposure, central area of ulcer with tendon/periosteum exposed. The left lateral malleolus ulcer now with Juarez, [name] ([id number]) central area of nonviable tissue, otherwise deep tissue injury. She is wearing compression wraps to the left lower extremity, she will place the right lower extremity compression wraps on when she gets  home. She will be out of town over the weekend and return next week and follow-up appointment. She completed her doxycycline this morning 05/03/18 on evaluation today patient appears to be doing very well in regard to her right lateral ankle ulcer in general. At least she's showing some signs of improvement in this regard. Unfortunately she has some additional injury to the left lateral malleolus region which appears to be new likely even over the past several days. Again this determination is based on the overall appearance. With that being said the patient is obviously frustrated about this currently. 05/10/18-She is here in follow-up evaluation for bilateral lateral malleolar ulcers. She states she has purchased offloading shoes/boots and they will arrive tomorrow. She was asked to bring them in the office at next week's appointment so her provider is aware of product being utilized. She continues to sleep on right or left side, she has been encouraged to sleep on her back. The right lateral malleolus ulcer is precariously close to peri-osteum; will order xray. The left lateral malleolus ulcer is improved. Will switch back to santyl; she will follow up next week. 05/17/18 on evaluation today patient actually appears to be doing very well in regard to her malleolus her ulcers compared to last time I saw them. She does not seem to have as much in the way of contusion at this point which is great news. With that being said she does continue to have discomfort and I do believe that she is still continuing to benefit from the offloading/pressure reducing boots that were recommended. I think this is the key to trying to get this to heal up completely. 05/24/18 on evaluation today patient actually appears to be doing worse at this point in time unfortunately compared to her last week's evaluation. She is having really no increased pain which is good news unfortunately she does have more maceration in your  theme and noted surrounding the right lateral ankle the left lateral ankle is not really is erythematous I do not see signs of the overt cellulitis on that side. Unfortunately the wounds do not seem to have shown any signs of improvement since the last evaluation. She also has significant swelling especially on the right compared to previous some of this may be due to infection however also think that she may be served better while she has these wounds by compression wrapping versus continuing to use the Juxta-Lite for the time being. Especially with the amount of drainage that she is experiencing at this point. No fevers, chills, nausea, or vomiting noted at this time. 05/31/18 on evaluation today patient appears to actually be doing better in regard to her right lateral lower extremity ulcer specifically on the malleolus region. She has been tolerating the antibiotic without complication. With that being said she still continues to have issues but a  little bit of redness although nothing like she what she was experiencing previous. She still continues to pressure to her ankle area she did get the problem on offloading boots unfortunately she will not wear them she states there too uncomfortable and she can't get in and out of the bed. Nonetheless at this point her wounds seem to be continually getting worse which is not what we want I'm getting somewhat concerned about her progress and how things are going to proceed if we do not intervene in some way shape or form. I therefore had a very lengthy conversation today about offloading yet again and even made a specific suggestion for switching her to a memory foam mattress and even gave the information for a specific one that they could look at getting if it was something that they were interested in considering. She does not want to be considered for a hospital bed air mattress although honestly insurance would not cover it that she does not have any  wounds on her trunk. 06/14/18 on evaluation today both wounds over the bilateral lateral malleolus her ulcers appear to be doing better there's no evidence of pressure injury at this point. She did get the foam mattress for her bed and this does seem to have been extremely beneficial for her in my pinion. Her daughter states that she is having difficulty getting out of bed because of how soft it is. The patient also relates this to be. Nonetheless I do feel like she's actually doing better. Unfortunately right after and around the time she was getting the mattress she also sustained a fall when she got up to go pick up the phone and ended up injuring her right elbow she has 18 sutures in place. We are not caring for this currently although home health is going to be taking the sutures out shortly. Nonetheless this may be something that we need to evaluate going forward. It depends on how well it has or has not healed in the end. She also recently saw an orthopedic specialist for an injection in the right shoulder just before her fall unfortunately the fall seems to have worsened her pain. 06/21/18 on evaluation today patient appears to be doing about the same in regard to her lateral malleolus ulcers. Both appear to be just a little bit deeper but again we are clinging away the necrotic and dead tissue which I think is why this is progressing towards a deeper realm as opposed to improving from my measurement standpoint in that regard. Nonetheless she has been tolerating the dressing changes she absolutely hates the memory foam mattress topper that was obtained for her nonetheless I do believe this is still doing excellent as far as taking care of excess pressure in regard to the lateral malleolus regions. She in fact has no pressure injury that I see whereas in weeks past it was week by week I was constantly seeing new pressure injuries. Overall I think it has been very beneficial for her. 07/03/18;  patient arrives in my clinic today. She has deep punched out areas over her bilateral lateral malleoli. The area on the right has some more depth. IXAYANA, DRANSFIELD ([id number]) We spent a lot of time today talking about pressure relief for these areas. This started when her daughter asked for a prescription for a memory foam mattress. I have never written a prescription for a mattress and I don't think insurances would pay for that on an ordinary bed. In any case  he came up that she has foam boots that she refuses to wear. I would suggest going to these before any other offloading issues when she is in bed. They say she is meticulous about offloading this the rest of the day 07/10/18- She is seen in follow-up evaluation for bilateral, lateral malleolus ulcers. There is no improvement in the ulcers. She has purchased and is sleeping on a memory foam mattress/overlay, she has been using the offloading boots nightly over the past week. She has a follow up appointment with vascular medicine at the end of October, in my opinion this follow up should be expedited given her deterioration and suboptimal TBI results. We will order plain film xray of the left ankle as deeper structures are palpable; would consider having MRI, regardless of xray report(s). The ulcers will be treated with iodoflex/iodosorb, she is unable to safely change the dressings daily with santyl. 07/19/18 on evaluation today patient appears to be doing in general visually well in regard to her bilateral lateral malleolus ulcers. She has been tolerating the dressing changes without complication which is good news. With that being said we did have an x-ray performed on 07/12/18 which revealed a slight loosen see in the lateral portion of the distal left fibula which may represent artifact but underline lytic destruction or osteomyelitis could not be excluded. MRI was recommended. With that being said we can see about getting the patient  scheduled for an MRI to further evaluate this area. In fact we have that scheduled currently for August 20 19,019. 07/26/18 on evaluation today patient's wound on the right lateral ankle actually appears to be doing fairly well at this point in my pinion. She has made some good progress currently. With that being said unfortunately in regard to the left lateral ankle ulcer this seems to be a little bit more problematic at this time. In fact as I further evaluated the situation she actually had bone exposed which is the first time that's been the case in the bone appear to be necrotic. Currently I did review patient's note from Dr. Bunnie Domino office with [facility]. He stated that ABI was 1.26 on the right and 0.95 on the left with good waveforms. Her perfusion is stable not reduced from previous studies and her digital waveforms were pretty good particularly on the right. His conclusion upon review of the note was that there was not much she could do to improve her perfusion and he felt she was adequate for wound healing. His suggestion was that she continued to see [place] and consider a synthetic skin graft if there was no underlying infection. He plans to see her back in six months or as needed. 08/01/18 on evaluation today patient appears to be doing better in regard to her right lateral ankle ulcer. Her left lateral ankle ulcer is about the same she still has bone involvement in evidence of necrosis. There does not appear to be evidence of infection at this time On the right lateral lower extremity. I have started her on the Augmentin she picked this up and started this yesterday. This is to get her through until she sees infectious disease which is scheduled for 08/12/18. 08/06/18 on evaluation today patient appears to be doing rather well considering my discussion with patient's daughter at the end of last week. The area which was marked where she had erythema seems to be improved and  this is good news. With that being said overall the patient seems  to be making good improvement when it comes to the overall appearance of the right lateral ankle ulcer although this has been slow she at least is coming around in this regard. Unfortunately in regard to the left lateral ankle ulcer this is osteomyelitis based on the pathology report as well is bone culture. Nonetheless we are still waiting CT scan. Unfortunately the MRI we originally ordered cannot be performed as the patient is a pacemaker which I had overlooked. Nonetheless we are working on the CT scan approval and scheduling as of now. She did go to the hospital over the weekend and was placed on IV Cefzo for a couple of days. Fortunately this seems to have improved the erythema quite significantly which is good news. There does not appear to be any evidence of worsening infection at this time. She did have some bleeding after the last debridement therefore I did not perform any sharp debridement in regard to left lateral ankle at this point. Patient has been approved for a snap vac for the right lateral ankle. 08/14/18; the patient with wounds over her bilateral lateral malleoli. The area on the right actually looks quite good. Been using a snap back on this area. Healthy granulation and appears to be filling in. Unfortunately the area on the left is really problematic. 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 Also noted to have cellulitis. She saw Dr. Novella Olive of infectious disease today and was put on linezolid. We are able to verify this with her pharmacy. She is completed the Augmentin that she was already on. We've been using Iodoflex to this area 08/23/18 on evaluation today patient's wounds both actually appear to be doing better compared to my prior evaluations. Fortunately she showing signs of good improvement in regard to the overall wound  status especially where were using the snap vac on the right. In regard to left lateral malleolus the wound bed actually appears to be much cleaner than previously noted. I do not feel any phone directly probed during evaluation today and though there is tendon noted this does not appear Lister, [name] ([id number]) to be necrotic it's actually fairly good as far as the overall appearance of the tendon is concerned. In general the wound bed actually appears to be doing significantly better than it was previous. Patient is currently in the care of [name] and I did review that note today. He actually has her on two weeks of linezolid and then following the patient will be on 1-2 months of Keflex. That is the plan currently. She has been on antibiotics therapy as prescribed by myself initially starting on July 30, 2018 and has been on that continuously up to this point. 08/30/18 on evaluation today patient actually appears to be doing much better in regard to her right lateral malleolus ulcer. She has been tolerating the dressing changes specifically the snap vac without complication although she did have some issues with the seal currently. Apparently there was some trouble with getting it to maintain over the past week past Sunday. Nonetheless overall the wound appears better in regard to the right lateral malleolus region. In regard to left lateral malleolus this actually show some signs of additional granulation although there still tendon noted in the base of the wound this appears to be healthy not necrotic in any way whatsoever. We are considering potentially using a snap vac for the left lateral malleolus  as well the product wrap from [employer], [place], was present in the clinic today we're going to see this patient I did have her come in with me after obtaining consent from the patient and her daughter in order to look at the wound and see if there's any recommendation one way or another as  to whether or not they felt the snapback could be beneficial for the left lateral malleolus region. But the conclusion was that it might be but that this is definitely a little bit deeper wound than what traditionally would be utilized for a snap vac. 09/06/18 on evaluation today patient actually appears to be doing excellent in my pinion in regard to both ankle ulcers. She has been tolerating the dressing changes without complication which is great news. Specifically we have been using the snap vac. In regard to the right ankle I'm not even sure that this is going to be necessary for today and following as the wound has filled in quite nicely. In regard to the left ankle I do believe that we're seeing excellent epithelialization from the edge as well as granulation in the central portion the tendon is still exposed but there's no evidence of necrotic bone and in general I feel like the patient has made excellent progress even compared to last week with just one week of the snap vac. 09/11/18; this is a patient who has wounds on her bilateral lateral malleoli. Initially both of these were deep stage IV wounds in the setting of chronic arterial insufficiency. She has been revascularized. As I understand think she been using snap vacs to both of these wounds however the area on the right became more superficial and currently she is only using it on the left. Using silver collagen on the right and silver collagen under the back on the left I believe 09/19/18 on evaluation today patient actually appears to be doing very well in regard to her lateral malleolus or ulcers bilaterally. She has been tolerating the dressing changes without complication. Fortunately there does not appear to be any evidence of infection at this time. Overall I feel like she is improving in an excellent manner and I'm very pleased with the fact that everything seems to be turning towards the better for her. This has obviously been  a long road. 09/27/18 on evaluation today patient actually appears to be doing very well in regard to her bilateral lateral malleolus ulcers. She has been tolerating the dressing changes without complication. Fortunately there does not appear to be any evidence of infection at this time which is also great news. No fevers, chills, nausea, or vomiting noted at this time. Overall I feel like she is doing excellent with the snap vac on the left malleolus. She had 40 mL of fluid collection over the past week. 10/04/18 on evaluation today patient actually appears to be doing well in regard to her bilateral lateral malleolus ulcers. She continues to tolerate the dressing changes without complication. One issue that I see is the snap vac on the left lateral malleolus which appears to have sealed off some fluid underlying this area and has not really allowed it to heal to the degree that I would like to see. For that reason I did suggest at this point we may want to pack a small piece of packing strip into this region to allow it to more effectively wick out fluid. 10/11/18 in general the patient today does not feel that she has been doing very well.  She's been a little bit lethargic and subsequently is having bodyaches as well according to what she tells me today. With that being said overall she has been concerned with the fact that something may be worsening although to be honest her wounds really have not been appearing poorly. She does have a new ulcer on her left heel unfortunately. This may be pressure related. Nonetheless it seems to me to have potentially started at least as a blister I do not see any evidence of deep tissue injury. In regard to the left ankle the snap vac still seems to be causing the ceiling off of the deeper part of the wound which is in turn trapping fluid. I'm not extremely pleased with the overall appearance as far as progress from last week to this week therefore I'm gonna  discontinue the snap vac at this point. 10/18/18 patient unfortunately this point has not been feeling well for the past several days. She was seen by Grayland Ormond her primary care provider who is a Librarian, academic at [facility]. Subsequently she states that she's been very weak and generally feeling malaise. No fevers, chills, nausea, or vomiting noted at this time. With that being said bloodwork was performed at the PCP office on the 11th of this month which showed a white blood cell count of 10.7. This was repeated today Clock, [name] ([id number]) and shows a white blood cell count of 12.4. This does show signs of worsening. Coupled with the fact that she is feeling worse and that her left ankle wound is not really showing signs of improvement I feel like this is an indication that the osteomyelitis is likely exacerbating not improving. Overall I think we may also want to check her C-reactive protein and sedimentation rate. Actually did call [facility] office this afternoon while the patient was in the office here with me. Subsequently based on the findings we discussed treatment possibilities and I think that it is appropriate for [place] to go ahead and initiate treatment with doxycycline which I'm going to do. Subsequently he did agree to see about adding a CRP and sedimentation rate to her orders. If that has not already been drawn to where they can run it they will contact the patient she can come back to have that check. They are in agreement with plan as far as the patient and her daughter are concerned. Nonetheless also think we need to get in touch with Dr. Henreitta Leber office to see about getting the patient scheduled with him as soon as possible. 11/08/18 on evaluation today patient presents for follow-up concerning her bilateral foot and ankle ulcers. I did do an extensive review of her chart in epic today. Subsequently she was seen by [name] he did initiate Cefepime IV  antibiotic therapy. Subsequently she had some issues with her PICC line this had to be removed because it was coiled and then replaced. Fortunately that was now settled. Unfortunately she has continued have issues with her left heel as well as the issues that she is experiencing with her bilateral lateral malleolus regions. I do believe however both areas seem to be doing a little bit better on evaluation today which is good news. No fevers, chills, nausea, or vomiting noted at this time. She actually has an angiogram schedule with Dr. dew on this coming Monday, November 11, 2018. Subsequently the patient states that she is feeling much better especially than what she was roughly 2 weeks ago. She actually had to  cancel an appointment because she was feeling so poorly. No fevers, chills, nausea, or vomiting noted at this time. 11/15/18 on evaluation today patient actually is status post having had her angiogram with Dr. dew Monday, four days ago. 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 He had to go and work on several areas of the vasculature fortunately he was able to obtain no more than a 30% residual stenosis throughout post procedure. I reviewed this note today. I think this will definitely help with healing at this time. Fortunately there does not appear to be any signs of infection and I do feel like ratio already has a better appearance to it. 11/22/18 upon evaluation today patient actually appears to be doing very well in regard to her wounds in general. The right lateral malleolus looks excellent the heel looks better in the left lateral malleolus also appears to be doing a little better. With that being said the right second toe actually appears to be open and training we been watching this is been dry and stable but now is open. 12/03/2018 Seen today for follow-up and management of multiple bilateral lower extremity wounds. New pressure injury of the great toe  which is closed at this time. Wound of the right distal second toe appears larger today with deep undermining and a pocket of fluid present within the undermining region. Left and right malleolus is wounds are stable today with no signs and symptoms of infection.Denies any needs or concerns during exam today. 12/13/18 on evaluation today patient appears to be doing somewhat better in regard to her left heel ulcer. She also seems to be completely healed in regard to the right lateral malleolus ulcer. The left malleolus ulcer is smaller what unfortunately the wounds which are new over the first and second toes of the right foot are what are most concerning at this point especially the second. Both areas did require sharp debridement today. 12/20/18 on evaluation today patient's wound actually appears to be doing better in regard to left lateral ankle and her right lateral ankle continues to remain healed. The hill ulcer on the left is improved. She does have improvement noted as well in regard to both toe ulcers. Overall I'm very pleased in this regard. No fevers, chills, nausea, or vomiting noted at this time. 12/23/18 on evaluation today patient is seen after she had her toenails trimmed at the podiatrist office due to issues with her right great toe. There was what appeared to be dark eschar on the surface of the wound which had her in the podiatrist concerned. Nonetheless as I remember that during the last office visit I had utilize silver nitrate of this area I was much less concerned about the situation. Subsequently I was able to clean off much of this tissue without any complication today. This does not appear to show any signs of infection and actually look somewhat better compared to last time post debridement. Her second toe on the right foot actually had callous over and there did appear still be some fluid underneath this that would require debridement today. 12/27/18 on evaluation today  patient actually appears to be showing signs of improvement at all locations. Even the left lateral ankle although this is not quite as great as the other sites. Fortunately there does not appear to be any signs of infection at this time and both of her toes on the right foot seem to be showing signs of improvement  which is good news and very pleased in this regard. 01/03/19 on evaluation today patient appears to be doing better for the most part in regard to her wounds in particular. There Griffee, [name] ([id number]) does not appear to be any evidence of infection at this time which is good news. Fortunately there is no sign of really worsening anywhere except for the right great toe which she does have what appears to be a bruise/deep tissue injury which is very superficial and already resolving. I'm not sure where this came from I questioned her extensively and she does not recall what may have happened with this. Other than that the patient seems to be doing well even the left lateral ankle ulcer looks good and is getting smaller. 01/10/19 on evaluation today patient appears to be doing well in regard to her left heel wound and both of her toe wounds. Overall I feel like there is definitely improvement here and I'm happy in that regard. With that being said unfortunately she is having issues with the left lateral malleolus ulcer which unfortunately still has a lot of depth to it. This is gonna be a very difficult wound for [place] to be able to truly get to heal. I may want to consider some type of skin substitute to see if this would be of benefit for her. I'll discuss this with her more the next visit most likely. This was something I thought about more at the end of the visit when I was Artie out of the room and the patient had been discharged. 01/17/19 on evaluation today patient appears to be doing very well in regard to her wounds in general. She's been making excellent progress at this time.  Fortunately there's no sign of infection at this time either. No fevers, chills, nausea, or vomiting noted at this time. The biggest issue is still her left lateral malleolus where it appears to be doing well and is getting smaller but still shows a small corner where this is deeper and goes down into what appears to be the joint space. Nonetheless this is taking much longer to heal although it still looks better in smaller than previous evaluations. 01/24/19 on evaluation today patient's wounds actually appear to be doing rather well in general overall. She did require some sharp debridement in regard to the right great toe but everything else appears to be doing excellent no debridement was even necessary. No fevers, chills, nausea, or vomiting noted at this time. 01/31/19 on evaluation today patient actually appears to be doing much better in regard to her left foot wound on the heel as well as the ankle. The right great toe appears to be a little bit worse today this had callous over and trapped a lot of fluid underneath. Fortunately there's no signs of infection at any site which is great news. 02/07/19 on evaluation today patient actually appears to be doing decently well in regard to all of her ulcers at this point. No sharp debridement was required she is a little bit of hyper granulation in regard to the left lateral ankle as well as the left heel but the hill itself is almost completely healed which is excellent news. Overall been very pleased in this regard. 02/14/19 on evaluation today patient actually appears to be doing very well in regard to her ulcers on the right first toe, left lateral malleolus, and left heel. In fact the heel is almost completely healed at this point. The patient does not  show any signs of infection which is good news. Overall very pleased with how things have progressed. 04/18/19 Telehealth Evaluation During the COVID-19 National Emergency: Verbal Consent: Obtained  from patient Allergies: reviewed and the active list is current. Medication changes: patient has no current medication changes. COVID-19 Screening: 1. Have you traveled internationally or on a cruise ship in the last 14 dayso No 2. Have you had contact with someone with or under investigation for COVID-19o No 3. Have you had a fever, cough, sore throat, or experiencing shortness of breatho No on evaluation today actually did have a visit with this patient through a telehealth encounter with her home health nurse. Subsequently it was noted that the patient actually appears to be doing okay in regard to her wounds both the right great toe as well as the left lateral malleolus have shown signs of improvement although this in your theme around the left lateral malleolus there eschar coverings for both locations. The question is whether or not they are actually close and whether or not home health needs to discharge the patient or not. Nonetheless my concern is this point obviously is that without actually seeing her and being able to evaluate this directly I cannot ensure that she is completely healed which is the question that I'm being asked. 04/22/19 on evaluation today patient presents for her first evaluation since last time I saw her which was actually February 14, 2019. I did do a telehealth visit last week in which point it was questionable whether or not she may be healed and had to bring her in today for confirmation. With that being said she does seem to be doing quite well at this point which is good news. There does not appear to be any drainage in the deed I believe her wounds may be healed. Readmission: HEWAN, STUHL ([id number]) 09/04/2019 on evaluation today patient appears to be doing unfortunately somewhat more poorly in regard to her left foot ulcer secondary to a wound that began on 08/21/2019 at least when she first noticed this. Fortunately she has not had any evidence of  active infection at this time. Systemically. I also do not necessarily see any evidence of infection at the blister/wound site on the first metatarsal head plantar aspect. This almost appears to be something that may have just rubbed inappropriately causing this to breakdown. They did not want a wait too long to come in to be seen as again she had significant issues in the past with wounds that took quite a while to heal in fact it was close to 2 years. Nonetheless this does not appear to be quite that bad but again we do need to remove some of the necrotic tissue from the surface of the wound to tell exactly the extent. She does not appear to have any significant arterial disease at this point and again her last ABIs and TBI's are recorded above in the alert section her left ABI was 1.27 with a TBI of 0.72 to the right ABI 1.08 with a TBI of 0.39. Other than this the patient has been doing quite well since I last saw her and that was in May 2020. 09/11/2019 on evaluation today patient appeared to be doing very well with regard to her plantar foot ulcer on the left. In fact this appears to be almost completely healed which is awesome. That is after just 1 week of intervention. With that being said there is no signs of active infection at this  time. Electronic Signature(s) Signed: 09/11/2019 3:50:56 PM By: Worthy Keeler PA-C Entered By: Worthy Keeler on 09/11/2019 15:50:55 Jett, [name] ([id number]) -------------------------------------------------------------------------------- Physical Exam Details Patient Name: [name] Date of Service: 09/11/2019 3:15 PM Medical Record Number: [id number] Patient Account Number: [id number] Date of Birth/Sex: 24-Nov-1923 (85 y.o. F) Treating RN: Army Melia Primary Care Provider: Grayland Ormond Other Clinician: Referring Provider: Grayland Ormond Treating Provider/Extender: STONE III, Delania Ferg Weeks in Treatment:  1 Constitutional Well-nourished and well-hydrated in no acute distress. Respiratory normal breathing without difficulty. clear to auscultation bilaterally. Cardiovascular regular rate and rhythm with normal S1, S2. Psychiatric this patient is able to make decisions and demonstrates good insight into disease process. Alert and Oriented x 3. pleasant and cooperative. Notes Upon inspection patient's wound bed showed signs of good granulation and epithelialization there is just a very small opening remaining. This is almost closed and again once that is finally and completely closed will be ready to proceed with transitioning back into her normal shoes. No sharp debridement was necessary today. Electronic Signature(s) Signed: 09/11/2019 3:51:52 PM By: Worthy Keeler PA-C Entered By: Worthy Keeler on 09/11/2019 15:51:52 Lybbert, [name] ([id number]) -------------------------------------------------------------------------------- Physician Orders Details Patient Name: [name] Date of Service: 09/11/2019 3:15 PM Medical Record Number: [id number] Patient Account Number: [id number] Date of Birth/Sex: October 26, 1923 (85 y.o. F) Treating RN: Army Melia Primary Care Provider: Grayland Ormond Other Clinician: Referring Provider: Grayland Ormond Treating Provider/Extender: Melburn Hake, Valincia Touch Weeks in Treatment: 1 Verbal / Phone Orders: No Diagnosis Coding ICD-10 Coding Code Description I89.0 Lymphedema, not elsewhere classified I87.2 Venous insufficiency (chronic) (peripheral) I73.89 Other specified peripheral vascular diseases L97.522 Non-pressure chronic ulcer of other part of left foot with fat layer exposed Wound Cleansing Wound #6 Left,Plantar Metatarsal head first o Clean wound with Normal Saline. o Cleanse wound with mild soap and water Skin Barriers/Peri-Wound Care Wound #6 Left,Plantar Metatarsal head first o Skin Prep - periwound Primary Wound Dressing Wound #6  Left,Plantar Metatarsal head first o Other: - antibiotic ointment Secondary Dressing Wound #6 Left,Plantar Metatarsal head first o Boardered Foam Dressing Dressing Change Frequency Wound #6 Left,Plantar Metatarsal head first o Dressing is to be changed Monday and Thursday. - Thursday in office, Monday by Home Health Follow-up Appointments Wound #6 Left,Plantar Metatarsal head first o Return Appointment in 1 week. Off-Loading Wound #6 Left,Plantar Metatarsal head first o Open toe surgical shoe with peg assist. Home Health Wound #6 Left,Plantar Metatarsal head first o [place] for [place] Nurse may visit PRN to address patientos wound care needs. JOHANNE, MCQUERRY ([id number]) o FACE TO FACE ENCOUNTER: MEDICARE and MEDICAID PATIENTS: I certify that this patient is under my care and that I had a face-to-face encounter that meets the physician face-to-face encounter requirements with this patient on this date. The encounter with the patient was in whole or in part for the following MEDICAL CONDITION: (primary reason for [place]) MEDICAL NECESSITY: I certify, that based on my findings, NURSING services are a medically necessary home health service. HOME BOUND STATUS: I certify that my clinical findings support that this patient is homebound (i.e., Due to illness or injury, pt requires aid of supportive devices such as crutches, cane, wheelchairs, walkers, the use of special transportation or the assistance of another person to leave their place of residence. There is a normal inability to leave the home and doing so requires considerable and taxing effort. Other absences are for medical reasons / religious  services and are infrequent or of short duration when for other reasons). o If current dressing causes regression in wound condition, may D/C ordered dressing product/s and apply Normal Saline Moist Dressing daily until next  [place] / Other MD appointment. [place] of regression in wound condition at [phone]. o Please direct any NON-WOUND related issues/requests for orders to patient's Primary Care Physician Electronic Signature(s) Signed: 09/11/2019 5:11:53 PM By: Worthy Keeler PA-C Signed: 09/22/2019 8:11:06 AM By: Army Melia Entered By: Army Melia on 09/11/2019 15:48:54 Rajewski, [name] ([id number]) -------------------------------------------------------------------------------- Problem List Details Patient Name: [name] Date of Service: 09/11/2019 3:15 PM Medical Record Number: [id number] Patient Account Number: [id number] Date of Birth/Sex: [birth year] (85 y.o. F) Treating RN: Army Melia Primary Care Provider: Grayland Ormond Other Clinician: Referring Provider: Grayland Ormond Treating Provider/Extender: Melburn Hake, Emmarie Sannes Weeks in Treatment: 1 Active Problems ICD-10 Evaluated Encounter Code Description Active Date Today Diagnosis I89.0 Lymphedema, not elsewhere classified 09/04/2019 No Yes I87.2 Venous insufficiency (chronic) (peripheral) 09/04/2019 No Yes I73.89 Other specified peripheral vascular diseases 09/04/2019 No Yes L97.522 Non-pressure chronic ulcer of other part of left foot with fat 09/04/2019 No Yes layer exposed Inactive Problems Resolved Problems Electronic Signature(s) Signed: 09/11/2019 3:42:32 PM By: Worthy Keeler PA-C Entered By: Worthy Keeler on 09/11/2019 15:42:32 Meschke, [name] ([id number]) -------------------------------------------------------------------------------- Progress Note Details Patient Name: [name] Date of Service: 09/11/2019 3:15 PM Medical Record Number: [id number] Patient Account Number: [id number] Date of Birth/Sex: June 30, 1923 (85 y.o. 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 Dr. Amalia Hailey the podiatrist saw her on 07/20/2017 and sent her to the wound center for possible hyperbaric oxygen therapy. past medical history of peripheral vascular disease, varicose veins, status post appendectomy, basal cell carcinoma excision from the left leg, cholecystectomy, pacemaker placement, right lower extremity angiography done by Dr. dew in March 2017 with placement of a stent. there is also note of a successful ablation of the right small saphenous vein done which was reviewed by ultrasound on 10/24/2016. the patient had a right small saphenous vein ablation done on 10/20/2016. The patient has never been a smoker. She has been seen by Dr. Corene Cornea dew the vascular surgeon who most recently saw her on 06/15/2017 for evaluation of ongoing problems with right leg swelling. She had a lower extremity arterial duplex examination done(02/13/17) which showed patent distal right superficial femoral artery stent and above-the-knee popliteal stent without evidence of restenosis. The ABI was more than 1.3 on the right and more than 1.3 on the left. This was consistent with noncompressible arteries due to medial calcification. The right great toe pressure and PPG waveforms are within normal limits and the left great toe pressure and PPG waveforms are decreased. he recommended she continue to wear her compression stockings and continue with elevation. She is scheduled to have a noninvasive arterial study in the near future 08/16/2017 -- had a lower extremity arterial duplex examination done which showed patent distal right superficial femoral artery stent and  above-the-knee popliteal stent without evidence of restenosis. The ABI was more than 1.3 on the right and more than 1.3 on  the left. This was consistent with noncompressible arteries due to medial calcification. The right great toe pressure and PPG waveforms are within normal limits and the left great toe pressure and PPG waveforms are decreased. the x-ray of the right ankle has not yet been done 08/24/2017 -- had a right ankle x-ray -- IMPRESSION:1. No fracture, bone lesion or evidence of osteomyelitis. 2. Lateral soft tissue swelling with a soft tissue ulcer. she has not yet seen the vascular surgeon for review 08/31/17 on evaluation today patient's wound appears to be showing signs of improvement. She still with her appointment with vascular in order to review her results of her vascular study and then determine if any intervention would be recommended at that time. No fevers, chills, nausea, or vomiting noted at this time. She has been tolerating the dressing changes without complication. 09/28/17 on evaluation today patient's wound appears to show signs of good improvement in regard to the granulation tissue which is surfacing. There is still a layer of slough covering the wound and the posterior portion is still significantly deeper than the anterior nonetheless there has been some good sign of things moving towards the better. She is going to go back to Dr. dew for reevaluation to ensure her blood flow is still appropriate. That will be before her next evaluation with [place] next week. No fevers, chills, nausea, or vomiting noted at this time. Patient does have some discomfort rated to be a 3-4/10 depending on activity specifically cleansing the wound makes it worse. 10/05/2017 -- the patient was seen by Dr. Lucky Cowboy last week and noninvasive studies showed a normal right ABI with brisk STACIA, BRICKETT. ([id number]) triphasic waveforms consistent with no arterial insufficiency including normal  digital pressures. The duplex showed a patent distal right SFA stent and the proximal SFA was also normal. He was pleased with her test and thought she should have enough of perfusion for normal wound healing. He would see her back in 6 months time. 12/21/17 on evaluation today patient appears to be doing fairly well in regard to her right lateral ankle wound. Unfortunately the main issue that she is expansion at this point is that she is having some issues with what appears to be some cellulitis in the right anterior shin. She has also been noting a little bit of uncomfortable feeling especially last night and her ankle area. I'm afraid that she made the developing a little bit of an infection. With that being said I think it is in the early stages. 12/28/17 on evaluation today patient's ankle appears to be doing excellent. She's making good progress at this point the cellulitis seems to have improved after last week's evaluation. Overall she is having no significant discomfort which is excellent news. She does have an appointment with Dr. dew on March 29, 2018 for reevaluation in regard to the stent he placed. She seems to have excellent blood flow in the right lower extremity. 01/19/12 on evaluation today patient's wound appears to be doing very well. In fact she does not appear to require debridement at this point, there's no evidence of infection, and overall from the standpoint of the wound she seems to be doing very well. With that being said I believe that it may be time to switch to different dressing away from the [facility] Dressing she tells me she does have a lot going on her friend actually passed away yesterday and she's also having a lot of issues with her husband this obviously is  weighing heavy on her as far as your thoughts and concerns today. 01/25/18 on evaluation today patient appears to be doing fairly well in regard to her right lateral malleolus. She has been tolerating the  dressing changes without complication. Overall I feel like this is definitely showing signs of improvement as far as how the overall appearance of the wound is there's also evidence of epithelium start to migrate over the granulation tissue. In general I think that she is progressing nicely as far as the wound is concerned. The only concern she really has is whether or not we can switch to every other week visits in order to avoid having as many appointments as her daughters have a difficult time getting her to her appointments as well as the patient's husband to his he is not doing very well at this point. 02/22/18 on evaluation today patient's right lateral malleolus ulcer appears to be doing great. She has been tolerating the dressing changes without complication. Overall you making excellent progress at this time. Patient is having no significant discomfort. 03/15/18 on evaluation today patient appears to be doing much more poorly in regard to her right lateral ankle ulcer at this point. Unfortunately since have last seen her her husband has passed just a few days ago is obviously weighed heavily on her her daughter also had surgery well she is with her today as usual. There does not appear to be any evidence of infection she does seem to have significant contusion/deep tissue injury to the right lateral malleolus which was not noted previous when I saw her last. It's hard to tell of exactly when this injury occurred although during the time she was spending the night in the hospital this may have been most likely. 03/22/18 on evaluation today patient appears to actually be doing very well in regard to her ulcer. She did unfortunately have a setback which was noted last week however the good news is we seem to be getting back on track and in fact the wound in the core did still have some necrotic tissue which will be addressed at this point today but in general I'm seeing signs that things are on  the up and up. She is glad to hear this obviously she's been somewhat concerned that due to the how her wound digressed more recently. 03/29/18 on evaluation today patient appears to be doing fairly well in regard to her right lower extremity lateral malleolus ulcer. She unfortunately does have a new area of pressure injury over the inferior portion where the wound has opened up a little bit larger secondary to the pressure she seems to be getting. She does tell me sometimes when she sleeps at night that it actually hurts and does seem to be pushing on the area little bit more unfortunately. There does not appear to be any evidence of infection which is good news. She has been tolerating the dressing changes without complication. She also did have some bruising in the left second and third toes due to the fact that she may have bump this or injured it although she has neuropathy so she does not feel she did move recently that may have been where this came from. Nonetheless there does not appear to be any evidence of infection at this time. 04/12/18 on evaluation today patient's wound on the right lateral ankle actually appears to be doing a little bit better with a lot of necrotic docking tissue centrally loosening up in clearing away. However she  does have the beginnings of a deep tissue injury on the left lateral malleolus likely due to the fact we've been trying offload the right as much as we have. I think she may benefit from an assistive soft device to help with offloading and it looks like they're looking at one of the doughnut conditions that wraps around the lower leg to offload which I think will definitely do a good job. With that being said I think we definitely need to address this issue on the left before it becomes a wound. Patient is not having significant pain. KRYSTIAN, PEADEN ([id number]) 04/19/18 on evaluation today patient appears to be doing excellent in regard to the progress  she's made with her right lateral ankle ulcer. The left ankle region which did show evidence of a deep tissue injury seems to be resolving there's little fluid noted underneath and a blister there's nothing open at this point in time overall I feel like this is progressing nicely which is good news. She does not seem to be having significant discomfort at this point which is also good news. 04/25/18-She is here in follow up evaluation for bilateral lateral malleolar ulcers. The right lateral malleolus ulcer with pale subcutaneous tissue exposure, central area of ulcer with tendon/periosteum exposed. The left lateral malleolus ulcer now with central area of nonviable tissue, otherwise deep tissue injury. She is wearing compression wraps to the left lower extremity, she will place the right lower extremity compression wraps on when she gets home. She will be out of town over the weekend and return next week and follow-up appointment. She completed her doxycycline this morning 05/03/18 on evaluation today patient appears to be doing very well in regard to her right lateral ankle ulcer in general. At least she's showing some signs of improvement in this regard. Unfortunately she has some additional injury to the left lateral malleolus region which appears to be new likely even over the past several days. Again this determination is based on the overall appearance. With that being said the patient is obviously frustrated about this currently. 05/10/18-She is here in follow-up evaluation for bilateral lateral malleolar ulcers. She states she has purchased offloading shoes/boots and they will arrive tomorrow. She was asked to bring them in the office at next week's appointment so her provider is aware of product being utilized. She continues to sleep on right or left side, she has been encouraged to sleep on her back. The right lateral malleolus ulcer is precariously close to peri-osteum; will order xray. The  left lateral malleolus ulcer is improved. Will switch back to santyl; she will follow up next week. 05/17/18 on evaluation today patient actually appears to be doing very well in regard to her malleolus her ulcers compared to last time I saw them. She does not seem to have as much in the way of contusion at this point which is great news. With that being said she does continue to have discomfort and I do believe that she is still continuing to benefit from the offloading/pressure reducing boots that were recommended. I think this is the key to trying to get this to heal up completely. 05/24/18 on evaluation today patient actually appears to be doing worse at this point in time unfortunately compared to her last week's evaluation. She is having really no increased pain which is good news unfortunately she does have more maceration in your theme and noted surrounding the right lateral ankle the left lateral ankle is not really  is erythematous I do not see signs of the overt cellulitis on that side. Unfortunately the wounds do not seem to have shown any signs of improvement since the last evaluation. She also has significant swelling especially on the right compared to previous some of this may be due to infection however also think that she may be served better while she has these wounds by compression wrapping versus continuing to use the Juxta-Lite for the time being. Especially with the amount of drainage that she is experiencing at this point. No fevers, chills, nausea, or vomiting noted at this time. 05/31/18 on evaluation today patient appears to actually be doing better in regard to her right lateral lower extremity ulcer specifically on the malleolus region. She has been tolerating the antibiotic without complication. With that being said she still continues to have issues but a little bit of redness although nothing like she what she was experiencing previous. She still continues to pressure to  her ankle area she did get the problem on offloading boots unfortunately she will not wear them she states there too uncomfortable and she can't get in and out of the bed. Nonetheless at this point her wounds seem to be continually getting worse which is not what we want I'm getting somewhat concerned about her progress and how things are going to proceed if we do not intervene in some way shape or form. I therefore had a very lengthy conversation today about offloading yet again and even made a specific suggestion for switching her to a memory foam mattress and even gave the information for a specific one that they could look at getting if it was something that they were interested in considering. She does not want to be considered for a hospital bed air mattress although honestly insurance would not cover it that she does not have any wounds on her trunk. 06/14/18 on evaluation today both wounds over the bilateral lateral malleolus her ulcers appear to be doing better there's no evidence of pressure injury at this point. She did get the foam mattress for her bed and this does seem to have been extremely beneficial for her in my pinion. Her daughter states that she is having difficulty getting out of bed because of how soft it is. The patient also relates this to be. Nonetheless I do feel like she's actually doing better. Unfortunately right after and around the time she was getting the mattress she also sustained a fall when she got up to go pick up the phone and ended up injuring her right elbow she has 18 sutures in place. We are not caring for this currently although home health is going to be taking the sutures out shortly. Nonetheless this may be something that we need to evaluate going forward. It depends on how well it has or has not healed in the end. She also recently saw an orthopedic specialist for an injection in the right shoulder just before her fall unfortunately the fall seems to  have worsened her pain. 06/21/18 on evaluation today patient appears to be doing about the same in regard to her lateral malleolus ulcers. Both appear to be just a little bit deeper but again we are clinging away the necrotic and dead tissue which I think is why this is progressing towards a deeper realm as opposed to improving from my measurement standpoint in that regard. Nonetheless she has been tolerating the dressing changes she absolutely hates the memory foam mattress topper that was obtained  for her nonetheless ANAPAULA, NORDLAND ([id number]) I do believe this is still doing excellent as far as taking care of excess pressure in regard to the lateral malleolus regions. She in fact has no pressure injury that I see whereas in weeks past it was week by week I was constantly seeing new pressure injuries. Overall I think it has been very beneficial for her. 07/03/18; patient arrives in my clinic today. She has deep punched out areas over her bilateral lateral malleoli. The area on the right has some more depth. We spent a lot of time today talking about pressure relief for these areas. This started when her daughter asked for a prescription for a memory foam mattress. I have never written a prescription for a mattress and I don't think insurances would pay for that on an ordinary bed. In any case he came up that she has foam boots that she refuses to wear. I would suggest going to these before any other offloading issues when she is in bed. They say she is meticulous about offloading this the rest of the day 07/10/18- She is seen in follow-up evaluation for bilateral, lateral malleolus ulcers. There is no improvement in the ulcers. She has purchased and is sleeping on a memory foam mattress/overlay, she has been using the offloading boots nightly over the past week. She has a follow up appointment with vascular medicine at the end of October, in my opinion this follow up should be expedited given  her deterioration and suboptimal TBI results. We will order plain film xray of the left ankle as deeper structures are palpable; would consider having MRI, regardless of xray report(s). The ulcers will be treated with iodoflex/iodosorb, she is unable to safely change the dressings daily with santyl. 07/19/18 on evaluation today patient appears to be doing in general visually well in regard to her bilateral lateral malleolus ulcers. She has been tolerating the dressing changes without complication which is good news. With that being said we did have an x-ray performed on 07/12/18 which revealed a slight loosen see in the lateral portion of the distal left fibula which may represent artifact but underline lytic destruction or osteomyelitis could not be excluded. MRI was recommended. With that being said we can see about getting the patient scheduled for an MRI to further evaluate this area. In fact we have that scheduled currently for August 20 19,019. 07/26/18 on evaluation today patient's wound on the right lateral ankle actually appears to be doing fairly well at this point in my pinion. She has made some good progress currently. With that being said unfortunately in regard to the left lateral ankle ulcer this seems to be a little bit more problematic at this time. In fact as I further evaluated the situation she actually had bone exposed which is the first time that's been the case in the bone appear to be necrotic. Currently I did review patient's note from Dr. Bunnie Domino office with [facility]. He stated that ABI was 1.26 on the right and 0.95 on the left with good waveforms. Her perfusion is stable not reduced from previous studies and her digital waveforms were pretty good particularly on the right. His conclusion upon review of the note was that there was not much she could do to improve her perfusion and he felt she was adequate for wound healing. His suggestion was that  she continued to see [place] and consider a synthetic skin graft if there was no underlying infection.  He plans to see her back in six months or as needed. 08/01/18 on evaluation today patient appears to be doing better in regard to her right lateral ankle ulcer. Her left lateral ankle ulcer is about the same she still has bone involvement in evidence of necrosis. There does not appear to be evidence of infection at this time On the right lateral lower extremity. I have started her on the Augmentin she picked this up and started this yesterday. This is to get her through until she sees infectious disease which is scheduled for 08/12/18. 08/06/18 on evaluation today patient appears to be doing rather well considering my discussion with patient's daughter at the end of last week. The area which was marked where she had erythema seems to be improved and this is good news. With that being said overall the patient seems to be making good improvement when it comes to the overall appearance of the right lateral ankle ulcer although this has been slow she at least is coming around in this regard. Unfortunately in regard to the left lateral ankle ulcer this is osteomyelitis based on the pathology report as well is bone culture. Nonetheless we are still waiting CT scan. Unfortunately the MRI we originally ordered cannot be performed as the patient is a pacemaker which I had overlooked. Nonetheless we are working on the CT scan approval and scheduling as of now. She did go to the hospital over the weekend and was placed on IV Cefzo for a couple of days. Fortunately this seems to have improved the erythema quite significantly which is good news. There does not appear to be any evidence of worsening infection at this time. She did have some bleeding after the last debridement therefore I did not perform any sharp debridement in regard to left lateral ankle at this point. Patient has been approved for a snap vac for the  right lateral ankle. 08/14/18; the patient with wounds over her bilateral lateral malleoli. The area on the right actually looks quite good. Been using a snap back on this area. Healthy granulation and appears to be filling in. Unfortunately the area on the left is really problematic. 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 Also noted to have cellulitis. She saw Dr. Novella Olive of infectious disease today and was put on linezolid. We are able to verify this with her pharmacy. She is completed the Augmentin that she was already on. We've been using Iodoflex to this AZAELIA, KINCANNON ([id number]) area 08/23/18 on evaluation today patient's wounds both actually appear to be doing better compared to my prior evaluations. Fortunately she showing signs of good improvement in regard to the overall wound status especially where were using the snap vac on the right. In regard to left lateral malleolus the wound bed actually appears to be much cleaner than previously noted. I do not feel any phone directly probed during evaluation today and though there is tendon noted this does not appear to be necrotic it's actually fairly good as far as the overall appearance of the tendon is concerned. In general the wound bed actually appears to be doing significantly better than it was previous. Patient is currently in the care of [name] and I did review that note today. He actually has her on two weeks of linezolid and then following the patient will be on 1-2 months of Keflex. That is the plan currently. She has  been on antibiotics therapy as prescribed by myself initially starting on July 30, 2018 and has been on that continuously up to this point. 08/30/18 on evaluation today patient actually appears to be doing much better in regard to her right lateral malleolus ulcer. She has been tolerating the dressing changes specifically the  snap vac without complication although she did have some issues with the seal currently. Apparently there was some trouble with getting it to maintain over the past week past Sunday. Nonetheless overall the wound appears better in regard to the right lateral malleolus region. In regard to left lateral malleolus this actually show some signs of additional granulation although there still tendon noted in the base of the wound this appears to be healthy not necrotic in any way whatsoever. We are considering potentially using a snap vac for the left lateral malleolus as well the product wrap from [employer], [employer], was present in the clinic today we're going to see this patient I did have her come in with me after obtaining consent from the patient and her daughter in order to look at the wound and see if there's any recommendation one way or another as to whether or not they felt the snapback could be beneficial for the left lateral malleolus region. But the conclusion was that it might be but that this is definitely a little bit deeper wound than what traditionally would be utilized for a snap vac. 09/06/18 on evaluation today patient actually appears to be doing excellent in my pinion in regard to both ankle ulcers. She has been tolerating the dressing changes without complication which is great news. Specifically we have been using the snap vac. In regard to the right ankle I'm not even sure that this is going to be necessary for today and following as the wound has filled in quite nicely. In regard to the left ankle I do believe that we're seeing excellent epithelialization from the edge as well as granulation in the central portion the tendon is still exposed but there's no evidence of necrotic bone and in general I feel like the patient has made excellent progress even compared to last week with just one week of the snap vac. 09/11/18; this is a patient who has wounds on her bilateral lateral malleoli.  Initially both of these were deep stage IV wounds in the setting of chronic arterial insufficiency. She has been revascularized. As I understand think she been using snap vacs to both of these wounds however the area on the right became more superficial and currently she is only using it on the left. Using silver collagen on the right and silver collagen under the back on the left I believe 09/19/18 on evaluation today patient actually appears to be doing very well in regard to her lateral malleolus or ulcers bilaterally. She has been tolerating the dressing changes without complication. Fortunately there does not appear to be any evidence of infection at this time. Overall I feel like she is improving in an excellent manner and I'm very pleased with the fact that everything seems to be turning towards the better for her. This has obviously been a long road. 09/27/18 on evaluation today patient actually appears to be doing very well in regard to her bilateral lateral malleolus ulcers. She has been tolerating the dressing changes without complication. Fortunately there does not appear to be any evidence of infection at this time which is also great news. No fevers, chills, nausea, or vomiting noted at this  time. Overall I feel like she is doing excellent with the snap vac on the left malleolus. She had 40 mL of fluid collection over the past week. 10/04/18 on evaluation today patient actually appears to be doing well in regard to her bilateral lateral malleolus ulcers. She continues to tolerate the dressing changes without complication. One issue that I see is the snap vac on the left lateral malleolus which appears to have sealed off some fluid underlying this area and has not really allowed it to heal to the degree that I would like to see. For that reason I did suggest at this point we may want to pack a small piece of packing strip into this region to allow it to more effectively wick out  fluid. 10/11/18 in general the patient today does not feel that she has been doing very well. She's been a little bit lethargic and subsequently is having bodyaches as well according to what she tells me today. With that being said overall she has been concerned with the fact that something may be worsening although to be honest her wounds really have not been appearing poorly. She does have a new ulcer on her left heel unfortunately. This may be pressure related. Nonetheless it seems to me to have potentially started at least as a blister I do not see any evidence of deep tissue injury. In regard to the left ankle the snap vac still seems to be causing the ceiling off of the deeper part of the wound which is in turn trapping fluid. I'm not extremely pleased with the overall appearance as far as progress from last week to this week therefore I'm gonna discontinue Bosserman, [name] ([id number]) the snap vac at this point. 10/18/18 patient unfortunately this point has not been feeling well for the past several days. She was seen by Grayland Ormond her primary care provider who is a Librarian, academic at [facility]. Subsequently she states that she's been very weak and generally feeling malaise. No fevers, chills, nausea, or vomiting noted at this time. With that being said bloodwork was performed at the PCP office on the 11th of this month which showed a white blood cell count of 10.7. This was repeated today and shows a white blood cell count of 12.4. This does show signs of worsening. Coupled with the fact that she is feeling worse and that her left ankle wound is not really showing signs of improvement I feel like this is an indication that the osteomyelitis is likely exacerbating not improving. Overall I think we may also want to check her C-reactive protein and sedimentation rate. Actually did call [facility] office this afternoon while the patient was in the office here with  me. Subsequently based on the findings we discussed treatment possibilities and I think that it is appropriate for [place] to go ahead and initiate treatment with doxycycline which I'm going to do. Subsequently he did agree to see about adding a CRP and sedimentation rate to her orders. If that has not already been drawn to where they can run it they will contact the patient she can come back to have that check. They are in agreement with plan as far as the patient and her daughter are concerned. Nonetheless also think we need to get in touch with Dr. Henreitta Leber office to see about getting the patient scheduled with him as soon as possible. 11/08/18 on evaluation today patient presents for follow-up concerning her bilateral foot and ankle ulcers. I  did do an extensive review of her chart in epic today. Subsequently she was seen by [name] he did initiate Cefepime IV antibiotic therapy. Subsequently she had some issues with her PICC line this had to be removed because it was coiled and then replaced. Fortunately that was now settled. Unfortunately she has continued have issues with her left heel as well as the issues that she is experiencing with her bilateral lateral malleolus regions. I do believe however both areas seem to be doing a little bit better on evaluation today which is good news. No fevers, chills, nausea, or vomiting noted at this time. She actually has an angiogram schedule with Dr. dew on this coming Monday, November 11, 2018. Subsequently the patient states that she is feeling much better especially than what she was roughly 2 weeks ago. She actually had to cancel an appointment because she was feeling so poorly. No fevers, chills, nausea, or vomiting noted at this time. 11/15/18 on evaluation today patient actually is status post having had her angiogram with Dr. dew Monday, four days ago. 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 He had to go and work on several  areas of the vasculature fortunately he was able to obtain no more than a 30% residual stenosis throughout post procedure. I reviewed this note today. I think this will definitely help with healing at this time. Fortunately there does not appear to be any signs of infection and I do feel like ratio already has a better appearance to it. 11/22/18 upon evaluation today patient actually appears to be doing very well in regard to her wounds in general. The right lateral malleolus looks excellent the heel looks better in the left lateral malleolus also appears to be doing a little better. With that being said the right second toe actually appears to be open and training we been watching this is been dry and stable but now is open. 12/03/2018 Seen today for follow-up and management of multiple bilateral lower extremity wounds. New pressure injury of the great toe which is closed at this time. Wound of the right distal second toe appears larger today with deep undermining and a pocket of fluid present within the undermining region. Left and right malleolus is wounds are stable today with no signs and symptoms of infection.Denies any needs or concerns during exam today. 12/13/18 on evaluation today patient appears to be doing somewhat better in regard to her left heel ulcer. She also seems to be completely healed in regard to the right lateral malleolus ulcer. The left malleolus ulcer is smaller what unfortunately the wounds which are new over the first and second toes of the right foot are what are most concerning at this point especially the second. Both areas did require sharp debridement today. 12/20/18 on evaluation today patient's wound actually appears to be doing better in regard to left lateral ankle and her right lateral ankle continues to remain healed. The hill ulcer on the left is improved. She does have improvement noted as well in regard to both toe ulcers. Overall I'm very pleased in this  regard. No fevers, chills, nausea, or vomiting noted at this time. 12/23/18 on evaluation today patient is seen after she had her toenails trimmed at the podiatrist office due to issues with her right great toe. There was what appeared to be dark eschar on the surface of the wound which had her in the podiatrist concerned. Nonetheless as I remember  that during the last office visit I had utilize silver nitrate of this area I was much less concerned about the situation. Subsequently I was able to clean off much of this tissue without any complication today. This does not appear to show any signs of infection and actually look somewhat better compared to last time post debridement. Her second toe on the right foot actually had callous over and there did appear still be some fluid underneath this that would require debridement today. LANYA, WHYNOT ([id number]) 12/27/18 on evaluation today patient actually appears to be showing signs of improvement at all locations. Even the left lateral ankle although this is not quite as great as the other sites. Fortunately there does not appear to be any signs of infection at this time and both of her toes on the right foot seem to be showing signs of improvement which is good news and very pleased in this regard. 01/03/19 on evaluation today patient appears to be doing better for the most part in regard to her wounds in particular. There does not appear to be any evidence of infection at this time which is good news. Fortunately there is no sign of really worsening anywhere except for the right great toe which she does have what appears to be a bruise/deep tissue injury which is very superficial and already resolving. I'm not sure where this came from I questioned her extensively and she does not recall what may have happened with this. Other than that the patient seems to be doing well even the left lateral ankle ulcer looks good and is getting smaller. 01/10/19  on evaluation today patient appears to be doing well in regard to her left heel wound and both of her toe wounds. Overall I feel like there is definitely improvement here and I'm happy in that regard. With that being said unfortunately she is having issues with the left lateral malleolus ulcer which unfortunately still has a lot of depth to it. This is gonna be a very difficult wound for [place] to be able to truly get to heal. I may want to consider some type of skin substitute to see if this would be of benefit for her. I'll discuss this with her more the next visit most likely. This was something I thought about more at the end of the visit when I was Artie out of the room and the patient had been discharged. 01/17/19 on evaluation today patient appears to be doing very well in regard to her wounds in general. She's been making excellent progress at this time. Fortunately there's no sign of infection at this time either. No fevers, chills, nausea, or vomiting noted at this time. The biggest issue is still her left lateral malleolus where it appears to be doing well and is getting smaller but still shows a small corner where this is deeper and goes down into what appears to be the joint space. Nonetheless this is taking much longer to heal although it still looks better in smaller than previous evaluations. 01/24/19 on evaluation today patient's wounds actually appear to be doing rather well in general overall. She did require some sharp debridement in regard to the right great toe but everything else appears to be doing excellent no debridement was even necessary. No fevers, chills, nausea, or vomiting noted at this time. 01/31/19 on evaluation today patient actually appears to be doing much better in regard to her left foot wound on the heel as well as the ankle.  The right great toe appears to be a little bit worse today this had callous over and trapped a lot of fluid underneath. Fortunately there's no  signs of infection at any site which is great news. 02/07/19 on evaluation today patient actually appears to be doing decently well in regard to all of her ulcers at this point. No sharp debridement was required she is a little bit of hyper granulation in regard to the left lateral ankle as well as the left heel but the hill itself is almost completely healed which is excellent news. Overall been very pleased in this regard. 02/14/19 on evaluation today patient actually appears to be doing very well in regard to her ulcers on the right first toe, left lateral malleolus, and left heel. In fact the heel is almost completely healed at this point. The patient does not show any signs of infection which is good news. Overall very pleased with how things have progressed. 04/18/19 Telehealth Evaluation During the COVID-19 National Emergency: Verbal Consent: Obtained from patient Allergies: reviewed and the active list is current. Medication changes: patient has no current medication changes. COVID-19 Screening: 1. Have you traveled internationally or on a cruise ship in the last 14 dayso No 2. Have you had contact with someone with or under investigation for COVID-19o No 3. Have you had a fever, cough, sore throat, or experiencing shortness of breatho No on evaluation today actually did have a visit with this patient through a telehealth encounter with her home health nurse. Subsequently it was noted that the patient actually appears to be doing okay in regard to her wounds both the right great toe as well as the left lateral malleolus have shown signs of improvement although this in your theme around the left lateral malleolus there eschar coverings for both locations. The question is whether or not they are actually close and whether or not home health needs to discharge the patient or not. Nonetheless my concern is this point obviously is that without actually seeing her and being able to evaluate this  directly I cannot ensure that she is completely healed which is the question that I'm being asked. 04/22/19 on evaluation today patient presents for her first evaluation since last time I saw her which was actually March 13, Daniely, ANMARIE GHARIBIAN. ([id number]) 2020. I did do a telehealth visit last week in which point it was questionable whether or not she may be healed and had to bring her in today for confirmation. With that being said she does seem to be doing quite well at this point which is good news. There does not appear to be any drainage in the deed I believe her wounds may be healed. Readmission: 09/04/2019 on evaluation today patient appears to be doing unfortunately somewhat more poorly in regard to her left foot ulcer secondary to a wound that began on 08/21/2019 at least when she first noticed this. Fortunately she has not had any evidence of active infection at this time. Systemically. I also do not necessarily see any evidence of infection at the blister/wound site on the first metatarsal head plantar aspect. This almost appears to be something that may have just rubbed inappropriately causing this to breakdown. They did not want a wait too long to come in to be seen as again she had significant issues in the past with wounds that took quite a while to heal in fact it was close to 2 years. Nonetheless this does not appear to be quite that  bad but again we do need to remove some of the necrotic tissue from the surface of the wound to tell exactly the extent. She does not appear to have any significant arterial disease at this point and again her last ABIs and TBI's are recorded above in the alert section her left ABI was 1.27 with a TBI of 0.72 to the right ABI 1.08 with a TBI of 0.39. Other than this the patient has been doing quite well since I last saw her and that was in May 2020. 09/11/2019 on evaluation today patient appeared to be doing very well with regard to her plantar foot ulcer  on the left. In fact this appears to be almost completely healed which is awesome. That is after just 1 week of intervention. With that being said there is no signs of active infection at this time. Patient History Information obtained from Patient. Family History Cancer - Father,Siblings, Heart Disease - Siblings, No family history of Diabetes, Hereditary Spherocytosis, Hypertension, Kidney Disease, Lung Disease, Seizures, Stroke, Thyroid Problems, Tuberculosis. Social History Never smoker, Marital Status - Married, Alcohol Use - Never, Drug Use - No History, Caffeine Use - Rarely. Medical History Eyes Patient has history of Cataracts - surgery Cardiovascular Patient has history of Congestive Heart Failure, Hypertension, Peripheral Arterial Disease Denies history of Angina, Arrhythmia, Coronary Artery Disease, Deep Vein Thrombosis, Hypotension, Myocardial Infarction, Peripheral Venous Disease, Phlebitis, Vasculitis Integumentary (Skin) Denies history of History of Burn, History of pressure wounds Musculoskeletal Patient has history of Osteoarthritis Denies history of Gout, Rheumatoid Arthritis, Osteomyelitis Neurologic Patient has history of Neuropathy Denies history of Dementia, Quadriplegia, Paraplegia, Seizure Disorder Oncologic Denies history of Received Chemotherapy, Received Radiation Review of Systems (ROS) Constitutional Symptoms (General Health) Denies complaints or symptoms of Fatigue, Fever, Chills, Marked Weight Change. Respiratory Denies complaints or symptoms of Chronic or frequent coughs, Shortness of Breath. Cardiovascular Denies complaints or symptoms of Chest pain, LE edema. Psychiatric Denies complaints or symptoms of Anxiety, Claustrophobia. NICOLI, ROAM ([id number]) Objective Constitutional Well-nourished and well-hydrated in no acute distress. Vitals Time Taken: 3:22 PM, Height: 65 in, Weight: 155 lbs, BMI: 25.8, Temperature: 98.6 F, Pulse: 88  bpm, Respiratory Rate: 18 breaths/min, Blood Pressure: 165/63 mmHg. Respiratory normal breathing without difficulty. clear to auscultation bilaterally. Cardiovascular regular rate and rhythm with normal S1, S2. Psychiatric this patient is able to make decisions and demonstrates good insight into disease process. Alert and Oriented x 3. pleasant and cooperative. General Notes: Upon inspection patient's wound bed showed signs of good granulation and epithelialization there is just a very small opening remaining. This is almost closed and again once that is finally and completely closed will be ready to proceed with transitioning back into her normal shoes. No sharp debridement was necessary today. Integumentary (Hair, Skin) Wound #6 status is Open. Original cause of wound was Gradually Appeared. The wound is located on the Left,Plantar Metatarsal head first. The wound measures 0.2cm length x 0.1cm width x 0.1cm depth; 0.016cm^2 area and 0.002cm^3 volume. There is Fat Layer (Subcutaneous Tissue) Exposed exposed. There is no tunneling or undermining noted. There is a small amount of sanguinous drainage noted. The wound margin is indistinct and nonvisible. There is large (67-100%) red granulation within the wound bed. There is no necrotic tissue within the wound bed. Assessment Active Problems ICD-10 Lymphedema, not elsewhere classified Venous insufficiency (chronic) (peripheral) Other specified peripheral vascular diseases Non-pressure chronic ulcer of other part of left foot with fat layer exposed Bosket, [name] ([id number]) Plan  Wound Cleansing: Wound #6 Left,Plantar Metatarsal head first: Clean wound with Normal Saline. Cleanse wound with mild soap and water Skin Barriers/Peri-Wound Care: Wound #6 Left,Plantar Metatarsal head first: Skin Prep - periwound Primary Wound Dressing: Wound #6 Left,Plantar Metatarsal head first: Other: - antibiotic ointment Secondary Dressing: Wound  #6 Left,Plantar Metatarsal head first: Boardered Foam Dressing Dressing Change Frequency: Wound #6 Left,Plantar Metatarsal head first: Dressing is to be changed Monday and Thursday. - Thursday in office, Monday by Home Health Follow-up Appointments: Wound #6 Left,Plantar Metatarsal head first: Return Appointment in 1 week. Off-Loading: Wound #6 Left,Plantar Metatarsal head first: Open toe surgical shoe with peg assist. Home Health: Wound #6 Left,Plantar Metatarsal head first: [place] for [place] Nurse may visit PRN to address patient s wound care needs. FACE TO FACE ENCOUNTER: MEDICARE and MEDICAID PATIENTS: I certify that this patient is under my care and that I had a face-to-face encounter that meets the physician face-to-face encounter requirements with this patient on this date. The encounter with the patient was in whole or in part for the following MEDICAL CONDITION: (primary reason for [place]) MEDICAL NECESSITY: I certify, that based on my findings, NURSING services are a medically necessary home health service. HOME BOUND STATUS: I certify that my clinical findings support that this patient is homebound (i.e., Due to illness or injury, pt requires aid of supportive devices such as crutches, cane, wheelchairs, walkers, the use of special transportation or the assistance of another person to leave their place of residence. There is a normal inability to leave the home and doing so requires considerable and taxing effort. Other absences are for medical reasons / religious services and are infrequent or of short duration when for other reasons). If current dressing causes regression in wound condition, may D/C ordered dressing product/s and apply Normal Saline Moist Dressing daily until next [place] / Other MD appointment. [place] of regression in wound condition at [phone]. Please direct any NON-WOUND  related issues/requests for orders to patient's Primary Care Physician 1. I would recommend at this point that we transition to just using a small amount of antibiotic ointment applied to the wound bed followed by border foam dressing for protection as I think that is really all the patient likely needs to allow this last and final little area to close. 2. I am going to suggest that we also continue to allow her and recommend that she wear the PEG assist offloading shoe which will hopefully keep everything under control as far as the offloading is concerned as well. She seems to have done very well over the past week. We will see patient back for reevaluation in 1 week here in the clinic. If anything worsens or changes patient will contact our office for additional recommendations. Electronic Signature(s) ELISHA, ALCANTARA ([id number]) Signed: 09/11/2019 3:53:02 PM By: Worthy Keeler PA-C Entered By: Worthy Keeler on 09/11/2019 15:53:02 Dabbs, [name] ([id number]) -------------------------------------------------------------------------------- ROS/PFSH Details Patient Name: [name] Date of Service: 09/11/2019 3:15 PM Medical Record Number: [id number] Patient Account Number: [id number] Date of Birth/Sex: 1922-12-17 (85 y.o. F) Treating RN: Army Melia Primary Care Provider: Grayland Ormond Other Clinician: Referring Provider: Grayland Ormond Treating Provider/Extender: Melburn Hake, Sharna Gabrys Weeks in Treatment: 1 Information Obtained From Patient Constitutional Symptoms (General Health) Complaints and Symptoms: Negative for: Fatigue; Fever; Chills; Marked Weight Change Respiratory Complaints and Symptoms: Negative for: Chronic or frequent coughs; Shortness of Breath Cardiovascular Complaints and Symptoms: Negative  for: Chest pain; LE edema Medical History: Positive for: Congestive Heart Failure; Hypertension; Peripheral Arterial Disease Negative for: Angina; Arrhythmia;  Coronary Artery Disease; Deep Vein Thrombosis; Hypotension; Myocardial Infarction; Peripheral Venous Disease; Phlebitis; Vasculitis Psychiatric Complaints and Symptoms: Negative for: Anxiety; Claustrophobia Eyes Medical History: Positive for: Cataracts - surgery Integumentary (Skin) Medical History: Negative for: History of Burn; History of pressure wounds Musculoskeletal Medical History: Positive for: Osteoarthritis Negative for: Gout; Rheumatoid Arthritis; Osteomyelitis Neurologic Medical History: Positive for: Neuropathy Negative for: Dementia; Quadriplegia; Paraplegia; Seizure Disorder JAYNIE, HAWKSWORTH ([id number]) Oncologic Medical History: Negative for: Received Chemotherapy; Received Radiation HBO Extended History Items Eyes: Cataracts Immunizations Pneumococcal Vaccine: Received Pneumococcal Vaccination: Yes Implantable Devices None Family and Social History Cancer: Yes - Father,Siblings; Diabetes: No; Heart Disease: Yes - Siblings; Hereditary Spherocytosis: No; Hypertension: No; Kidney Disease: No; Lung Disease: No; Seizures: No; Stroke: No; Thyroid Problems: No; Tuberculosis: No; Never smoker; Marital Status - Married; Alcohol Use: Never; Drug Use: No History; Caffeine Use: Rarely; Financial Concerns: No; Food, Clothing or Shelter Needs: No; Support System Lacking: No; Transportation Concerns: No Physician Affirmation I have reviewed and agree with the above information. Electronic Signature(s) Signed: 09/11/2019 5:11:53 PM By: Worthy Keeler PA-C Signed: 09/22/2019 8:11:06 AM By: Army Melia Entered By: Worthy Keeler on 09/11/2019 15:51:13 Cerami, [name] ([id number]) -------------------------------------------------------------------------------- SuperBill Details Patient Name: [name] Date of Service: 09/11/2019 Medical Record Number: [id number] Patient Account Number: [id number] Date of Birth/Sex: 1923/10/13 (85 y.o. F) Treating RN:  Army Melia Primary Care Provider: Grayland Ormond Other Clinician: Referring Provider: Grayland Ormond Treating Provider/Extender: Melburn Hake, Jillana Selph Weeks in Treatment: 1 Diagnosis Coding ICD-10 Codes Code Description I89.0 Lymphedema, not elsewhere classified I87.2 Venous insufficiency (chronic) (peripheral) I73.89 Other specified peripheral vascular diseases L97.522 Non-pressure chronic ulcer of other part of left foot with fat layer exposed Facility Procedures CPT4 Code: [id number] Description: 99213 - WOUND CARE VISIT-LEV 3 EST PT Modifier: Quantity: 1 Physician Procedures CPT4 Code: [id number] Description: [id number] - WC PHYS LEVEL 4 - EST PT ICD-10 Diagnosis Description I89.0 Lymphedema, not elsewhere classified I87.2 Venous insufficiency (chronic) (peripheral) I73.89 Other specified peripheral vascular diseases L97.522 Non-pressure chronic ulcer  of other part of left foot with fat Modifier: layer exposed Quantity: 1 Electronic Signature(s) Signed: 09/11/2019 3:53:23 PM By: Worthy Keeler PA-C Entered By: Worthy Keeler on 09/11/2019 15:53:23

## 2019-09-18 ENCOUNTER — Other Ambulatory Visit: Payer: Self-pay

## 2019-09-18 ENCOUNTER — Encounter: Payer: Medicare Other | Admitting: Physician Assistant

## 2019-09-18 DIAGNOSIS — L97522 Non-pressure chronic ulcer of other part of left foot with fat layer exposed: Secondary | ICD-10-CM | POA: Diagnosis not present

## 2019-09-18 NOTE — Progress Notes (Addendum)
[name] ([id number]) Visit Report for 09/18/2019 Chief Complaint Document Details Patient Name: [name], [name]. Date of Service: 09/18/2019 10:30 AM Medical Record Number: [id number] Patient Account Number: [id number] Date of Birth/Sex: 06-06-1923 (85 y.o. F) Treating RN: Cornell Barman Primary Care Provider: Grayland Ormond Other Clinician: Referring Provider: Grayland Ormond Treating Provider/Extender: Melburn Hake, HOYT Weeks in Treatment: 2 Information Obtained from: Patient Chief Complaint Left foot ulcer Electronic Signature(s) Signed: 09/18/2019 10:50:36 AM By: Worthy Keeler PA-C Entered By: Worthy Keeler on 09/18/2019 10:50:36 [place], [name] ([id number]) -------------------------------------------------------------------------------- HPI Details Patient Name: [name] Date of Service: 09/18/2019 10:30 AM Medical Record Number: [id number] Patient Account Number: [id number] Date of Birth/Sex: [birth year] (85 y.o. 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 Dr. Amalia Hailey the podiatrist saw her on 07/20/2017 and sent her to the wound center for possible hyperbaric oxygen therapy. past medical history of peripheral vascular disease, varicose veins, status post appendectomy, basal cell carcinoma excision from the left leg, cholecystectomy, pacemaker placement, right lower extremity angiography done by Dr. dew in March 2017 with placement of a stent. there is also note of a successful ablation of the right small saphenous vein done which was reviewed by ultrasound on 10/24/2016. the  patient had a right small saphenous vein ablation done on 10/20/2016. The patient has never been a smoker. She has been seen by Dr. Corene Cornea dew the vascular surgeon who most recently saw her on 06/15/2017 for evaluation of ongoing problems with right leg swelling. She had a lower extremity arterial duplex examination done(02/13/17) which showed patent distal right superficial femoral artery stent and above-the-knee popliteal stent without evidence of restenosis. The ABI was more than 1.3 on the right and more than 1.3 on the left. This was consistent with noncompressible arteries due to medial calcification. The right great toe pressure and PPG waveforms are within normal limits and the left great toe pressure and PPG waveforms are decreased. he recommended she continue to wear her compression stockings and continue with elevation. She is scheduled to have a noninvasive arterial study in the near future 08/16/2017 -- had a lower extremity arterial duplex examination done which showed patent distal right superficial femoral artery stent and above-the-knee popliteal stent without evidence of restenosis. The ABI was more than 1.3 on the right and more than 1.3 on the left. This was consistent with noncompressible arteries due to medial calcification. The right great toe pressure and PPG waveforms are within normal limits and the left great toe pressure and PPG waveforms are decreased. the x-ray of the right ankle has not yet been done 08/24/2017 -- had a right ankle x-ray -- IMPRESSION:1. No fracture, bone lesion or evidence of osteomyelitis. 2. Lateral soft tissue swelling with a soft tissue ulcer. she has not yet seen the vascular surgeon for review 08/31/17 on evaluation today patient's wound appears to be showing signs of improvement. She still with her appointment with vascular in order to review her results of her vascular study and then determine if any intervention would be recommended at that  time. No fevers, chills, nausea, or vomiting noted at this time. She has been tolerating the dressing changes without complication. 09/28/17 on  evaluation today patient's wound appears to show signs of good improvement in regard to the granulation tissue which is surfacing. There is still a layer of slough covering the wound and the posterior portion is still significantly deeper than the anterior nonetheless there has been some good sign of things moving towards the better. She is going to go back to Dr. dew for reevaluation to ensure her blood flow is still appropriate. That will be before her next evaluation with [place] next week. No fevers, chills, nausea, or vomiting noted at this time. Patient does have some discomfort rated to be a 3-4/10 depending on activity specifically cleansing the wound makes it worse. 10/05/2017 -- the patient was seen by Dr. Lucky Cowboy last week and noninvasive studies showed a normal right ABI with brisk triphasic waveforms consistent with no arterial insufficiency including normal digital pressures. The duplex showed a patent distal right SFA stent and the proximal SFA was also normal. He was pleased with her test and thought she should have enough of perfusion for normal wound healing. He would see her back in 6 months time. 12/21/17 on evaluation today patient appears to be doing fairly well in regard to her right lateral ankle wound. Unfortunately the main issue that she is expansion at this point is that she is having some issues with what appears to be some cellulitis in the [name], [name]. ([id number]) right anterior shin. She has also been noting a little bit of uncomfortable feeling especially last night and her ankle area. I'm afraid that she made the developing a little bit of an infection. With that being said I think it is in the early stages. 12/28/17 on evaluation today patient's ankle appears to be doing excellent. She's making good progress at this point  the cellulitis seems to have improved after last week's evaluation. Overall she is having no significant discomfort which is excellent news. She does have an appointment with Dr. dew on March 29, 2018 for reevaluation in regard to the stent he placed. She seems to have excellent blood flow in the right lower extremity. 01/19/12 on evaluation today patient's wound appears to be doing very well. In fact she does not appear to require debridement at this point, there's no evidence of infection, and overall from the standpoint of the wound she seems to be doing very well. With that being said I believe that it may be time to switch to different dressing away from the [facility] Dressing she tells me she does have a lot going on her friend actually passed away yesterday and she's also having a lot of issues with her husband this obviously is weighing heavy on her as far as your thoughts and concerns today. 01/25/18 on evaluation today patient appears to be doing fairly well in regard to her right lateral malleolus. She has been tolerating the dressing changes without complication. Overall I feel like this is definitely showing signs of improvement as far as how the overall appearance of the wound is there's also evidence of epithelium start to migrate over the granulation tissue. In general I think that she is progressing nicely as far as the wound is concerned. The only concern she really has is whether or not we can switch to every other week visits in order to avoid having as many appointments as her daughters have a difficult time getting her to her appointments as well as the patient's husband to his he is not doing very well at this point. 02/22/18 on  evaluation today patient's right lateral malleolus ulcer appears to be doing great. She has been tolerating the dressing changes without complication. Overall you making excellent progress at this time. Patient is having no  significant discomfort. 03/15/18 on evaluation today patient appears to be doing much more poorly in regard to her right lateral ankle ulcer at this point. Unfortunately since have last seen her her husband has passed just a few days ago is obviously weighed heavily on her her daughter also had surgery well she is with her today as usual. There does not appear to be any evidence of infection she does seem to have significant contusion/deep tissue injury to the right lateral malleolus which was not noted previous when I saw her last. It's hard to tell of exactly when this injury occurred although during the time she was spending the night in the hospital this may have been most likely. 03/22/18 on evaluation today patient appears to actually be doing very well in regard to her ulcer. She did unfortunately have a setback which was noted last week however the good news is we seem to be getting back on track and in fact the wound in the core did still have some necrotic tissue which will be addressed at this point today but in general I'm seeing signs that things are on the up and up. She is glad to hear this obviously she's been somewhat concerned that due to the how her wound digressed more recently. 03/29/18 on evaluation today patient appears to be doing fairly well in regard to her right lower extremity lateral malleolus ulcer. She unfortunately does have a new area of pressure injury over the inferior portion where the wound has opened up a little bit larger secondary to the pressure she seems to be getting. She does tell me sometimes when she sleeps at night that it actually hurts and does seem to be pushing on the area little bit more unfortunately. There does not appear to be any evidence of infection which is good news. She has been tolerating the dressing changes without complication. She also did have some bruising in the left second and third toes due to the fact that she may have bump this  or injured it although she has neuropathy so she does not feel she did move recently that may have been where this came from. Nonetheless there does not appear to be any evidence of infection at this time. 04/12/18 on evaluation today patient's wound on the right lateral ankle actually appears to be doing a little bit better with a lot of necrotic docking tissue centrally loosening up in clearing away. However she does have the beginnings of a deep tissue injury on the left lateral malleolus likely due to the fact we've been trying offload the right as much as we have. I think she may benefit from an assistive soft device to help with offloading and it looks like they're looking at one of the doughnut conditions that wraps around the lower leg to offload which I think will definitely do a good job. With that being said I think we definitely need to address this issue on the left before it becomes a wound. Patient is not having significant pain. 04/19/18 on evaluation today patient appears to be doing excellent in regard to the progress she's made with her right lateral ankle ulcer. The left ankle region which did show evidence of a deep tissue injury seems to be resolving there's little fluid noted underneath and  a blister there's nothing open at this point in time overall I feel like this is progressing nicely which is good news. She does not seem to be having significant discomfort at this point which is also good news. 04/25/18-She is here in follow up evaluation for bilateral lateral malleolar ulcers. The right lateral malleolus ulcer with pale subcutaneous tissue exposure, central area of ulcer with tendon/periosteum exposed. The left lateral malleolus ulcer now with [place], [name] ([id number]) central area of nonviable tissue, otherwise deep tissue injury. She is wearing compression wraps to the left lower extremity, she will place the right lower extremity compression wraps on when she gets  home. She will be out of town over the weekend and return next week and follow-up appointment. She completed her doxycycline this morning 05/03/18 on evaluation today patient appears to be doing very well in regard to her right lateral ankle ulcer in general. At least she's showing some signs of improvement in this regard. Unfortunately she has some additional injury to the left lateral malleolus region which appears to be new likely even over the past several days. Again this determination is based on the overall appearance. With that being said the patient is obviously frustrated about this currently. 05/10/18-She is here in follow-up evaluation for bilateral lateral malleolar ulcers. She states she has purchased offloading shoes/boots and they will arrive tomorrow. She was asked to bring them in the office at next week's appointment so her provider is aware of product being utilized. She continues to sleep on right or left side, she has been encouraged to sleep on her back. The right lateral malleolus ulcer is precariously close to peri-osteum; will order xray. The left lateral malleolus ulcer is improved. Will switch back to santyl; she will follow up next week. 05/17/18 on evaluation today patient actually appears to be doing very well in regard to her malleolus her ulcers compared to last time I saw them. She does not seem to have as much in the way of contusion at this point which is great news. With that being said she does continue to have discomfort and I do believe that she is still continuing to benefit from the offloading/pressure reducing boots that were recommended. I think this is the key to trying to get this to heal up completely. 05/24/18 on evaluation today patient actually appears to be doing worse at this point in time unfortunately compared to her last week's evaluation. She is having really no increased pain which is good news unfortunately she does have more maceration in your  theme and noted surrounding the right lateral ankle the left lateral ankle is not really is erythematous I do not see signs of the overt cellulitis on that side. Unfortunately the wounds do not seem to have shown any signs of improvement since the last evaluation. She also has significant swelling especially on the right compared to previous some of this may be due to infection however also think that she may be served better while she has these wounds by compression wrapping versus continuing to use the Juxta-Lite for the time being. Especially with the amount of drainage that she is experiencing at this point. No fevers, chills, nausea, or vomiting noted at this time. 05/31/18 on evaluation today patient appears to actually be doing better in regard to her right lateral lower extremity ulcer specifically on the malleolus region. She has been tolerating the antibiotic without complication. With that being said she still continues to have issues but a  little bit of redness although nothing like she what she was experiencing previous. She still continues to pressure to her ankle area she did get the problem on offloading boots unfortunately she will not wear them she states there too uncomfortable and she can't get in and out of the bed. Nonetheless at this point her wounds seem to be continually getting worse which is not what we want I'm getting somewhat concerned about her progress and how things are going to proceed if we do not intervene in some way shape or form. I therefore had a very lengthy conversation today about offloading yet again and even made a specific suggestion for switching her to a memory foam mattress and even gave the information for a specific one that they could look at getting if it was something that they were interested in considering. She does not want to be considered for a hospital bed air mattress although honestly insurance would not cover it that she does not have any  wounds on her trunk. 06/14/18 on evaluation today both wounds over the bilateral lateral malleolus her ulcers appear to be doing better there's no evidence of pressure injury at this point. She did get the foam mattress for her bed and this does seem to have been extremely beneficial for her in my pinion. Her daughter states that she is having difficulty getting out of bed because of how soft it is. The patient also relates this to be. Nonetheless I do feel like she's actually doing better. Unfortunately right after and around the time she was getting the mattress she also sustained a fall when she got up to go pick up the phone and ended up injuring her right elbow she has 18 sutures in place. We are not caring for this currently although home health is going to be taking the sutures out shortly. Nonetheless this may be something that we need to evaluate going forward. It depends on how well it has or has not healed in the end. She also recently saw an orthopedic specialist for an injection in the right shoulder just before her fall unfortunately the fall seems to have worsened her pain. 06/21/18 on evaluation today patient appears to be doing about the same in regard to her lateral malleolus ulcers. Both appear to be just a little bit deeper but again we are clinging away the necrotic and dead tissue which I think is why this is progressing towards a deeper realm as opposed to improving from my measurement standpoint in that regard. Nonetheless she has been tolerating the dressing changes she absolutely hates the memory foam mattress topper that was obtained for her nonetheless I do believe this is still doing excellent as far as taking care of excess pressure in regard to the lateral malleolus regions. She in fact has no pressure injury that I see whereas in weeks past it was week by week I was constantly seeing new pressure injuries. Overall I think it has been very beneficial for her. 07/03/18;  patient arrives in my clinic today. She has deep punched out areas over her bilateral lateral malleoli. The area on the right has some more depth. [name], [name] ([id number]) We spent a lot of time today talking about pressure relief for these areas. This started when her daughter asked for a prescription for a memory foam mattress. I have never written a prescription for a mattress and I don't think insurances would pay for that on an ordinary bed. In any case  he came up that she has foam boots that she refuses to wear. I would suggest going to these before any other offloading issues when she is in bed. They say she is meticulous about offloading this the rest of the day 07/10/18- She is seen in follow-up evaluation for bilateral, lateral malleolus ulcers. There is no improvement in the ulcers. She has purchased and is sleeping on a memory foam mattress/overlay, she has been using the offloading boots nightly over the past week. She has a follow up appointment with vascular medicine at the end of October, in my opinion this follow up should be expedited given her deterioration and suboptimal TBI results. We will order plain film xray of the left ankle as deeper structures are palpable; would consider having MRI, regardless of xray report(s). The ulcers will be treated with iodoflex/iodosorb, she is unable to safely change the dressings daily with santyl. 07/19/18 on evaluation today patient appears to be doing in general visually well in regard to her bilateral lateral malleolus ulcers. She has been tolerating the dressing changes without complication which is good news. With that being said we did have an x-ray performed on 07/12/18 which revealed a slight loosen see in the lateral portion of the distal left fibula which may represent artifact but underline lytic destruction or osteomyelitis could not be excluded. MRI was recommended. With that being said we can see about getting the patient  scheduled for an MRI to further evaluate this area. In fact we have that scheduled currently for August 20 19,019. 07/26/18 on evaluation today patient's wound on the right lateral ankle actually appears to be doing fairly well at this point in my pinion. She has made some good progress currently. With that being said unfortunately in regard to the left lateral ankle ulcer this seems to be a little bit more problematic at this time. In fact as I further evaluated the situation she actually had bone exposed which is the first time that's been the case in the bone appear to be necrotic. Currently I did review patient's note from Dr. Bunnie Domino office with [facility]. He stated that ABI was 1.26 on the right and 0.95 on the left with good waveforms. Her perfusion is stable not reduced from previous studies and her digital waveforms were pretty good particularly on the right. His conclusion upon review of the note was that there was not much she could do to improve her perfusion and he felt she was adequate for wound healing. His suggestion was that she continued to see [place] and consider a synthetic skin graft if there was no underlying infection. He plans to see her back in six months or as needed. 08/01/18 on evaluation today patient appears to be doing better in regard to her right lateral ankle ulcer. Her left lateral ankle ulcer is about the same she still has bone involvement in evidence of necrosis. There does not appear to be evidence of infection at this time On the right lateral lower extremity. I have started her on the Augmentin she picked this up and started this yesterday. This is to get her through until she sees infectious disease which is scheduled for 08/12/18. 08/06/18 on evaluation today patient appears to be doing rather well considering my discussion with patient's daughter at the end of last week. The area which was marked where she had erythema seems to be improved and  this is good news. With that being said overall the patient seems  to be making good improvement when it comes to the overall appearance of the right lateral ankle ulcer although this has been slow she at least is coming around in this regard. Unfortunately in regard to the left lateral ankle ulcer this is osteomyelitis based on the pathology report as well is bone culture. Nonetheless we are still waiting CT scan. Unfortunately the MRI we originally ordered cannot be performed as the patient is a pacemaker which I had overlooked. Nonetheless we are working on the CT scan approval and scheduling as of now. She did go to the hospital over the weekend and was placed on IV Cefzo for a couple of days. Fortunately this seems to have improved the erythema quite significantly which is good news. There does not appear to be any evidence of worsening infection at this time. She did have some bleeding after the last debridement therefore I did not perform any sharp debridement in regard to left lateral ankle at this point. Patient has been approved for a snap vac for the right lateral ankle. 08/14/18; the patient with wounds over her bilateral lateral malleoli. The area on the right actually looks quite good. Been using a snap back on this area. Healthy granulation and appears to be filling in. Unfortunately the area on the left is really problematic. 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 Also noted to have cellulitis. She saw Dr. Novella Olive of infectious disease today and was put on linezolid. We are able to verify this with her pharmacy. She is completed the Augmentin that she was already on. We've been using Iodoflex to this area 08/23/18 on evaluation today patient's wounds both actually appear to be doing better compared to my prior evaluations. Fortunately she showing signs of good improvement in regard to the overall wound  status especially where were using the snap vac on the right. In regard to left lateral malleolus the wound bed actually appears to be much cleaner than previously noted. I do not feel any phone directly probed during evaluation today and though there is tendon noted this does not appear [name], [name] ([id number]) to be necrotic it's actually fairly good as far as the overall appearance of the tendon is concerned. In general the wound bed actually appears to be doing significantly better than it was previous. Patient is currently in the care of [name] and I did review that note today. He actually has her on two weeks of linezolid and then following the patient will be on 1-2 months of Keflex. That is the plan currently. She has been on antibiotics therapy as prescribed by myself initially starting on July 30, 2018 and has been on that continuously up to this point. 08/30/18 on evaluation today patient actually appears to be doing much better in regard to her right lateral malleolus ulcer. She has been tolerating the dressing changes specifically the snap vac without complication although she did have some issues with the seal currently. Apparently there was some trouble with getting it to maintain over the past week past Sunday. Nonetheless overall the wound appears better in regard to the right lateral malleolus region. In regard to left lateral malleolus this actually show some signs of additional granulation although there still tendon noted in the base of the wound this appears to be healthy not necrotic in any way whatsoever. We are considering potentially using a snap vac for the left lateral malleolus  as well the product wrap from [employer], [place], was present in the clinic today we're going to see this patient I did have her come in with me after obtaining consent from the patient and her daughter in order to look at the wound and see if there's any recommendation one way or another as  to whether or not they felt the snapback could be beneficial for the left lateral malleolus region. But the conclusion was that it might be but that this is definitely a little bit deeper wound than what traditionally would be utilized for a snap vac. 09/06/18 on evaluation today patient actually appears to be doing excellent in my pinion in regard to both ankle ulcers. She has been tolerating the dressing changes without complication which is great news. Specifically we have been using the snap vac. In regard to the right ankle I'm not even sure that this is going to be necessary for today and following as the wound has filled in quite nicely. In regard to the left ankle I do believe that we're seeing excellent epithelialization from the edge as well as granulation in the central portion the tendon is still exposed but there's no evidence of necrotic bone and in general I feel like the patient has made excellent progress even compared to last week with just one week of the snap vac. 09/11/18; this is a patient who has wounds on her bilateral lateral malleoli. Initially both of these were deep stage IV wounds in the setting of chronic arterial insufficiency. She has been revascularized. As I understand think she been using snap vacs to both of these wounds however the area on the right became more superficial and currently she is only using it on the left. Using silver collagen on the right and silver collagen under the back on the left I believe 09/19/18 on evaluation today patient actually appears to be doing very well in regard to her lateral malleolus or ulcers bilaterally. She has been tolerating the dressing changes without complication. Fortunately there does not appear to be any evidence of infection at this time. Overall I feel like she is improving in an excellent manner and I'm very pleased with the fact that everything seems to be turning towards the better for her. This has obviously been  a long road. 09/27/18 on evaluation today patient actually appears to be doing very well in regard to her bilateral lateral malleolus ulcers. She has been tolerating the dressing changes without complication. Fortunately there does not appear to be any evidence of infection at this time which is also great news. No fevers, chills, nausea, or vomiting noted at this time. Overall I feel like she is doing excellent with the snap vac on the left malleolus. She had 40 mL of fluid collection over the past week. 10/04/18 on evaluation today patient actually appears to be doing well in regard to her bilateral lateral malleolus ulcers. She continues to tolerate the dressing changes without complication. One issue that I see is the snap vac on the left lateral malleolus which appears to have sealed off some fluid underlying this area and has not really allowed it to heal to the degree that I would like to see. For that reason I did suggest at this point we may want to pack a small piece of packing strip into this region to allow it to more effectively wick out fluid. 10/11/18 in general the patient today does not feel that she has been doing very well.  She's been a little bit lethargic and subsequently is having bodyaches as well according to what she tells me today. With that being said overall she has been concerned with the fact that something may be worsening although to be honest her wounds really have not been appearing poorly. She does have a new ulcer on her left heel unfortunately. This may be pressure related. Nonetheless it seems to me to have potentially started at least as a blister I do not see any evidence of deep tissue injury. In regard to the left ankle the snap vac still seems to be causing the ceiling off of the deeper part of the wound which is in turn trapping fluid. I'm not extremely pleased with the overall appearance as far as progress from last week to this week therefore I'm gonna  discontinue the snap vac at this point. 10/18/18 patient unfortunately this point has not been feeling well for the past several days. She was seen by Grayland Ormond her primary care provider who is a Librarian, academic at [facility]. Subsequently she states that she's been very weak and generally feeling malaise. No fevers, chills, nausea, or vomiting noted at this time. With that being said bloodwork was performed at the PCP office on the 11th of this month which showed a white blood cell count of 10.7. This was repeated today Mcghie, [name] ([id number]) and shows a white blood cell count of 12.4. This does show signs of worsening. Coupled with the fact that she is feeling worse and that her left ankle wound is not really showing signs of improvement I feel like this is an indication that the osteomyelitis is likely exacerbating not improving. Overall I think we may also want to check her C-reactive protein and sedimentation rate. Actually did call [facility] office this afternoon while the patient was in the office here with me. Subsequently based on the findings we discussed treatment possibilities and I think that it is appropriate for [place] to go ahead and initiate treatment with doxycycline which I'm going to do. Subsequently he did agree to see about adding a CRP and sedimentation rate to her orders. If that has not already been drawn to where they can run it they will contact the patient she can come back to have that check. They are in agreement with plan as far as the patient and her daughter are concerned. Nonetheless also think we need to get in touch with Dr. Henreitta Leber office to see about getting the patient scheduled with him as soon as possible. 11/08/18 on evaluation today patient presents for follow-up concerning her bilateral foot and ankle ulcers. I did do an extensive review of her chart in epic today. Subsequently she was seen by [name] he did initiate Cefepime IV  antibiotic therapy. Subsequently she had some issues with her PICC line this had to be removed because it was coiled and then replaced. Fortunately that was now settled. Unfortunately she has continued have issues with her left heel as well as the issues that she is experiencing with her bilateral lateral malleolus regions. I do believe however both areas seem to be doing a little bit better on evaluation today which is good news. No fevers, chills, nausea, or vomiting noted at this time. She actually has an angiogram schedule with Dr. dew on this coming Monday, November 11, 2018. Subsequently the patient states that she is feeling much better especially than what she was roughly 2 weeks ago. She actually had to  cancel an appointment because she was feeling so poorly. No fevers, chills, nausea, or vomiting noted at this time. 11/15/18 on evaluation today patient actually is status post having had her angiogram with Dr. dew Monday, four days ago. 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 He had to go and work on several areas of the vasculature fortunately he was able to obtain no more than a 30% residual stenosis throughout post procedure. I reviewed this note today. I think this will definitely help with healing at this time. Fortunately there does not appear to be any signs of infection and I do feel like ratio already has a better appearance to it. 11/22/18 upon evaluation today patient actually appears to be doing very well in regard to her wounds in general. The right lateral malleolus looks excellent the heel looks better in the left lateral malleolus also appears to be doing a little better. With that being said the right second toe actually appears to be open and training we been watching this is been dry and stable but now is open. 12/03/2018 Seen today for follow-up and management of multiple bilateral lower extremity wounds. New pressure injury of the great toe  which is closed at this time. Wound of the right distal second toe appears larger today with deep undermining and a pocket of fluid present within the undermining region. Left and right malleolus is wounds are stable today with no signs and symptoms of infection.Denies any needs or concerns during exam today. 12/13/18 on evaluation today patient appears to be doing somewhat better in regard to her left heel ulcer. She also seems to be completely healed in regard to the right lateral malleolus ulcer. The left malleolus ulcer is smaller what unfortunately the wounds which are new over the first and second toes of the right foot are what are most concerning at this point especially the second. Both areas did require sharp debridement today. 12/20/18 on evaluation today patient's wound actually appears to be doing better in regard to left lateral ankle and her right lateral ankle continues to remain healed. The hill ulcer on the left is improved. She does have improvement noted as well in regard to both toe ulcers. Overall I'm very pleased in this regard. No fevers, chills, nausea, or vomiting noted at this time. 12/23/18 on evaluation today patient is seen after she had her toenails trimmed at the podiatrist office due to issues with her right great toe. There was what appeared to be dark eschar on the surface of the wound which had her in the podiatrist concerned. Nonetheless as I remember that during the last office visit I had utilize silver nitrate of this area I was much less concerned about the situation. Subsequently I was able to clean off much of this tissue without any complication today. This does not appear to show any signs of infection and actually look somewhat better compared to last time post debridement. Her second toe on the right foot actually had callous over and there did appear still be some fluid underneath this that would require debridement today. 12/27/18 on evaluation today  patient actually appears to be showing signs of improvement at all locations. Even the left lateral ankle although this is not quite as great as the other sites. Fortunately there does not appear to be any signs of infection at this time and both of her toes on the right foot seem to be showing signs of improvement  which is good news and very pleased in this regard. 01/03/19 on evaluation today patient appears to be doing better for the most part in regard to her wounds in particular. There Dyck, [name] ([id number]) does not appear to be any evidence of infection at this time which is good news. Fortunately there is no sign of really worsening anywhere except for the right great toe which she does have what appears to be a bruise/deep tissue injury which is very superficial and already resolving. I'm not sure where this came from I questioned her extensively and she does not recall what may have happened with this. Other than that the patient seems to be doing well even the left lateral ankle ulcer looks good and is getting smaller. 01/10/19 on evaluation today patient appears to be doing well in regard to her left heel wound and both of her toe wounds. Overall I feel like there is definitely improvement here and I'm happy in that regard. With that being said unfortunately she is having issues with the left lateral malleolus ulcer which unfortunately still has a lot of depth to it. This is gonna be a very difficult wound for [place] to be able to truly get to heal. I may want to consider some type of skin substitute to see if this would be of benefit for her. I'll discuss this with her more the next visit most likely. This was something I thought about more at the end of the visit when I was Artie out of the room and the patient had been discharged. 01/17/19 on evaluation today patient appears to be doing very well in regard to her wounds in general. She's been making excellent progress at this time.  Fortunately there's no sign of infection at this time either. No fevers, chills, nausea, or vomiting noted at this time. The biggest issue is still her left lateral malleolus where it appears to be doing well and is getting smaller but still shows a small corner where this is deeper and goes down into what appears to be the joint space. Nonetheless this is taking much longer to heal although it still looks better in smaller than previous evaluations. 01/24/19 on evaluation today patient's wounds actually appear to be doing rather well in general overall. She did require some sharp debridement in regard to the right great toe but everything else appears to be doing excellent no debridement was even necessary. No fevers, chills, nausea, or vomiting noted at this time. 01/31/19 on evaluation today patient actually appears to be doing much better in regard to her left foot wound on the heel as well as the ankle. The right great toe appears to be a little bit worse today this had callous over and trapped a lot of fluid underneath. Fortunately there's no signs of infection at any site which is great news. 02/07/19 on evaluation today patient actually appears to be doing decently well in regard to all of her ulcers at this point. No sharp debridement was required she is a little bit of hyper granulation in regard to the left lateral ankle as well as the left heel but the hill itself is almost completely healed which is excellent news. Overall been very pleased in this regard. 02/14/19 on evaluation today patient actually appears to be doing very well in regard to her ulcers on the right first toe, left lateral malleolus, and left heel. In fact the heel is almost completely healed at this point. The patient does not  show any signs of infection which is good news. Overall very pleased with how things have progressed. 04/18/19 Telehealth Evaluation During the COVID-19 National Emergency: Verbal Consent: Obtained  from patient Allergies: reviewed and the active list is current. Medication changes: patient has no current medication changes. COVID-19 Screening: 1. Have you traveled internationally or on a cruise ship in the last 14 dayso No 2. Have you had contact with someone with or under investigation for COVID-19o No 3. Have you had a fever, cough, sore throat, or experiencing shortness of breatho No on evaluation today actually did have a visit with this patient through a telehealth encounter with her home health nurse. Subsequently it was noted that the patient actually appears to be doing okay in regard to her wounds both the right great toe as well as the left lateral malleolus have shown signs of improvement although this in your theme around the left lateral malleolus there eschar coverings for both locations. The question is whether or not they are actually close and whether or not home health needs to discharge the patient or not. Nonetheless my concern is this point obviously is that without actually seeing her and being able to evaluate this directly I cannot ensure that she is completely healed which is the question that I'm being asked. 04/22/19 on evaluation today patient presents for her first evaluation since last time I saw her which was actually February 14, 2019. I did do a telehealth visit last week in which point it was questionable whether or not she may be healed and had to bring her in today for confirmation. With that being said she does seem to be doing quite well at this point which is good news. There does not appear to be any drainage in the deed I believe her wounds may be healed. Readmission: [name], [name] ([id number]) 09/04/2019 on evaluation today patient appears to be doing unfortunately somewhat more poorly in regard to her left foot ulcer secondary to a wound that began on 08/21/2019 at least when she first noticed this. Fortunately she has not had any evidence of  active infection at this time. Systemically. I also do not necessarily see any evidence of infection at the blister/wound site on the first metatarsal head plantar aspect. This almost appears to be something that may have just rubbed inappropriately causing this to breakdown. They did not want a wait too long to come in to be seen as again she had significant issues in the past with wounds that took quite a while to heal in fact it was close to 2 years. Nonetheless this does not appear to be quite that bad but again we do need to remove some of the necrotic tissue from the surface of the wound to tell exactly the extent. She does not appear to have any significant arterial disease at this point and again her last ABIs and TBI's are recorded above in the alert section her left ABI was 1.27 with a TBI of 0.72 to the right ABI 1.08 with a TBI of 0.39. Other than this the patient has been doing quite well since I last saw her and that was in May 2020. 09/11/2019 on evaluation today patient appeared to be doing very well with regard to her plantar foot ulcer on the left. In fact this appears to be almost completely healed which is awesome. That is after just 1 week of intervention. With that being said there is no signs of active infection at this  time. 09/18/2019 on evaluation today patient actually appears to be doing excellent in fact she is completely healed based on what I am seeing at this point. Fortunately there is no signs of active infection at this time and overall patient is very pleased to hear that this area has healed so quickly. Electronic Signature(s) Signed: 09/18/2019 11:24:41 AM By: Worthy Keeler PA-C Entered By: Worthy Keeler on 09/18/2019 11:24:41 Rollo, [name] ([id number]) -------------------------------------------------------------------------------- Physical Exam Details Patient Name: [name] Date of Service: 09/18/2019 10:30 AM Medical Record Number:  [id number] Patient Account Number: [id number] Date of Birth/Sex: 05/16/1923 (85 y.o. F) Treating RN: Cornell Barman Primary Care Provider: Grayland Ormond Other Clinician: Referring Provider: Grayland Ormond Treating Provider/Extender: STONE III, HOYT Weeks in Treatment: 2 Constitutional Well-nourished and well-hydrated in no acute distress. Respiratory normal breathing without difficulty. Psychiatric this patient is able to make decisions and demonstrates good insight into disease process. Alert and Oriented x 3. pleasant and cooperative. Notes Shows complete epithelization there is some discoloration right under the skin but nothing that seems to indicate that there is a wound opening at this point. There was really no significant callus buildup either she is doing quite well in my opinion. Electronic Signature(s) Signed: 09/18/2019 11:26:21 AM By: Worthy Keeler PA-C Entered By: Worthy Keeler on 09/18/2019 11:26:21 [name] ([id number]) -------------------------------------------------------------------------------- Physician Orders Details Patient Name: [name] Date of Service: 09/18/2019 10:30 AM Medical Record Number: [id number] Patient Account Number: [id number] Date of Birth/Sex: Jan 22, 1923 (85 y.o. F) Treating RN: Cornell Barman Primary Care Provider: Grayland Ormond Other Clinician: Referring Provider: Grayland Ormond Treating Provider/Extender: Melburn Hake, HOYT Weeks in Treatment: 2 Verbal / Phone Orders: No Diagnosis Coding ICD-10 Coding Code Description I89.0 Lymphedema, not elsewhere classified I87.2 Venous insufficiency (chronic) (peripheral) I73.89 Other specified peripheral vascular diseases L97.522 Non-pressure chronic ulcer of other part of left foot with fat layer exposed Off-Loading o Open toe surgical shoe to: - additional 2 weeks [place] [facility] Discharge From [facility] o Discharge from [facility] complete Electronic Signature(s) Signed: 09/18/2019 5:55:45 PM By: Gretta Cool, BSN, RN, CWS, Kim RN, BSN Signed: 09/18/2019 6:39:08 PM By: Worthy Keeler PA-C Entered By: Gretta Cool, BSN, RN, CWS, Kim on 09/18/2019 11:12:13 Wahid, [name] ([id number]) -------------------------------------------------------------------------------- Problem List Details Patient Name: [name] Date of Service: 09/18/2019 10:30 AM Medical Record Number: [id number] Patient Account Number: [id number] Date of Birth/Sex: 11-24-1923 (85 y.o. F) Treating RN: Cornell Barman Primary Care Provider: Grayland Ormond Other Clinician: Referring Provider: Grayland Ormond Treating Provider/Extender: Melburn Hake, HOYT Weeks in Treatment: 2 Active Problems ICD-10 Evaluated Encounter Code Description Active Date Today Diagnosis I89.0 Lymphedema, not elsewhere classified 09/04/2019 No Yes I87.2 Venous insufficiency (chronic) (peripheral) 09/04/2019 No Yes I73.89 Other specified peripheral vascular diseases 09/04/2019 No Yes L97.522 Non-pressure chronic ulcer of other part of left foot with fat 09/04/2019 No Yes layer exposed Inactive Problems Resolved Problems Electronic Signature(s) Signed: 09/18/2019 10:50:30 AM By: Worthy Keeler PA-C Entered By: Worthy Keeler on 09/18/2019 10:50:30 Esh, [name] ([id number]) -------------------------------------------------------------------------------- Progress Note Details Patient Name: [name] Date of Service: 09/18/2019 10:30 AM Medical Record Number: [id number] Patient Account Number: [id number] Date of Birth/Sex: 09/29/1923 (85 y.o. F) Treating RN: Cornell Barman Primary Care Provider: Grayland Ormond Other Clinician: Referring Provider: Grayland Ormond Treating Provider/Extender: Melburn Hake, HOYT Weeks in Treatment: 2 Subjective Chief Complaint Information obtained from Patient Left foot ulcer History of Present Illness (HPI)  85 year old patient  who most recently has been seeing both podiatry and vascular surgery for a long-standing ulcer of her right lateral malleolus which has been treated with various methodologies. Dr. Amalia Hailey the podiatrist saw her on 07/20/2017 and sent her to the wound center for possible hyperbaric oxygen therapy. past medical history of peripheral vascular disease, varicose veins, status post appendectomy, basal cell carcinoma excision from the left leg, cholecystectomy, pacemaker placement, right lower extremity angiography done by Dr. dew in March 2017 with placement of a stent. there is also note of a successful ablation of the right small saphenous vein done which was reviewed by ultrasound on 10/24/2016. the patient had a right small saphenous vein ablation done on 10/20/2016. The patient has never been a smoker. She has been seen by Dr. Corene Cornea dew the vascular surgeon who most recently saw her on 06/15/2017 for evaluation of ongoing problems with right leg swelling. She had a lower extremity arterial duplex examination done(02/13/17) which showed patent distal right superficial femoral artery stent and above-the-knee popliteal stent without evidence of restenosis. The ABI was more than 1.3 on the right and more than 1.3 on the left. This was consistent with noncompressible arteries due to medial calcification. The right great toe pressure and PPG waveforms are within normal limits and the left great toe pressure and PPG waveforms are decreased. he recommended she continue to wear her compression stockings and continue with elevation. She is scheduled to have a noninvasive arterial study in the near future 08/16/2017 -- had a lower extremity arterial duplex examination done which showed patent distal right superficial femoral artery stent and above-the-knee popliteal stent without evidence of restenosis. The ABI was more than 1.3 on the right and more than 1.3 on the left. This was consistent with noncompressible  arteries due to medial calcification. The right great toe pressure and PPG waveforms are within normal limits and the left great toe pressure and PPG waveforms are decreased. the x-ray of the right ankle has not yet been done 08/24/2017 -- had a right ankle x-ray -- IMPRESSION:1. No fracture, bone lesion or evidence of osteomyelitis. 2. Lateral soft tissue swelling with a soft tissue ulcer. she has not yet seen the vascular surgeon for review 08/31/17 on evaluation today patient's wound appears to be showing signs of improvement. She still with her appointment with vascular in order to review her results of her vascular study and then determine if any intervention would be recommended at that time. No fevers, chills, nausea, or vomiting noted at this time. She has been tolerating the dressing changes without complication. 09/28/17 on evaluation today patient's wound appears to show signs of good improvement in regard to the granulation tissue which is surfacing. There is still a layer of slough covering the wound and the posterior portion is still significantly deeper than the anterior nonetheless there has been some good sign of things moving towards the better. She is going to go back to Dr. dew for reevaluation to ensure her blood flow is still appropriate. That will be before her next evaluation with [place] next week. No fevers, chills, nausea, or vomiting noted at this time. Patient does have some discomfort rated to be a 3-4/10 depending on activity specifically cleansing the wound makes it worse. 10/05/2017 -- the patient was seen by Dr. Lucky Cowboy last week and noninvasive studies showed a normal right ABI with brisk [name], [name]. ([id number]) triphasic waveforms consistent with no arterial insufficiency including normal digital pressures. The duplex showed a  patent distal right SFA stent and the proximal SFA was also normal. He was pleased with her test and thought she should have enough of  perfusion for normal wound healing. He would see her back in 6 months time. 12/21/17 on evaluation today patient appears to be doing fairly well in regard to her right lateral ankle wound. Unfortunately the main issue that she is expansion at this point is that she is having some issues with what appears to be some cellulitis in the right anterior shin. She has also been noting a little bit of uncomfortable feeling especially last night and her ankle area. I'm afraid that she made the developing a little bit of an infection. With that being said I think it is in the early stages. 12/28/17 on evaluation today patient's ankle appears to be doing excellent. She's making good progress at this point the cellulitis seems to have improved after last week's evaluation. Overall she is having no significant discomfort which is excellent news. She does have an appointment with Dr. dew on March 29, 2018 for reevaluation in regard to the stent he placed. She seems to have excellent blood flow in the right lower extremity. 01/19/12 on evaluation today patient's wound appears to be doing very well. In fact she does not appear to require debridement at this point, there's no evidence of infection, and overall from the standpoint of the wound she seems to be doing very well. With that being said I believe that it may be time to switch to different dressing away from the [facility] Dressing she tells me she does have a lot going on her friend actually passed away yesterday and she's also having a lot of issues with her husband this obviously is weighing heavy on her as far as your thoughts and concerns today. 01/25/18 on evaluation today patient appears to be doing fairly well in regard to her right lateral malleolus. She has been tolerating the dressing changes without complication. Overall I feel like this is definitely showing signs of improvement as far as how the overall appearance of the wound is there's also  evidence of epithelium start to migrate over the granulation tissue. In general I think that she is progressing nicely as far as the wound is concerned. The only concern she really has is whether or not we can switch to every other week visits in order to avoid having as many appointments as her daughters have a difficult time getting her to her appointments as well as the patient's husband to his he is not doing very well at this point. 02/22/18 on evaluation today patient's right lateral malleolus ulcer appears to be doing great. She has been tolerating the dressing changes without complication. Overall you making excellent progress at this time. Patient is having no significant discomfort. 03/15/18 on evaluation today patient appears to be doing much more poorly in regard to her right lateral ankle ulcer at this point. Unfortunately since have last seen her her husband has passed just a few days ago is obviously weighed heavily on her her daughter also had surgery well she is with her today as usual. There does not appear to be any evidence of infection she does seem to have significant contusion/deep tissue injury to the right lateral malleolus which was not noted previous when I saw her last. It's hard to tell of exactly when this injury occurred although during the time she was spending the night in the hospital this may have been most  likely. 03/22/18 on evaluation today patient appears to actually be doing very well in regard to her ulcer. She did unfortunately have a setback which was noted last week however the good news is we seem to be getting back on track and in fact the wound in the core did still have some necrotic tissue which will be addressed at this point today but in general I'm seeing signs that things are on the up and up. She is glad to hear this obviously she's been somewhat concerned that due to the how her wound digressed more recently. 03/29/18 on evaluation today patient  appears to be doing fairly well in regard to her right lower extremity lateral malleolus ulcer. She unfortunately does have a new area of pressure injury over the inferior portion where the wound has opened up a little bit larger secondary to the pressure she seems to be getting. She does tell me sometimes when she sleeps at night that it actually hurts and does seem to be pushing on the area little bit more unfortunately. There does not appear to be any evidence of infection which is good news. She has been tolerating the dressing changes without complication. She also did have some bruising in the left second and third toes due to the fact that she may have bump this or injured it although she has neuropathy so she does not feel she did move recently that may have been where this came from. Nonetheless there does not appear to be any evidence of infection at this time. 04/12/18 on evaluation today patient's wound on the right lateral ankle actually appears to be doing a little bit better with a lot of necrotic docking tissue centrally loosening up in clearing away. However she does have the beginnings of a deep tissue injury on the left lateral malleolus likely due to the fact we've been trying offload the right as much as we have. I think she may benefit from an assistive soft device to help with offloading and it looks like they're looking at one of the doughnut conditions that wraps around the lower leg to offload which I think will definitely do a good job. With that being said I think we definitely need to address this issue on the left before it becomes a wound. Patient is not having significant pain. [name], [name] ([id number]) 04/19/18 on evaluation today patient appears to be doing excellent in regard to the progress she's made with her right lateral ankle ulcer. The left ankle region which did show evidence of a deep tissue injury seems to be resolving there's little fluid noted  underneath and a blister there's nothing open at this point in time overall I feel like this is progressing nicely which is good news. She does not seem to be having significant discomfort at this point which is also good news. 04/25/18-She is here in follow up evaluation for bilateral lateral malleolar ulcers. The right lateral malleolus ulcer with pale subcutaneous tissue exposure, central area of ulcer with tendon/periosteum exposed. The left lateral malleolus ulcer now with central area of nonviable tissue, otherwise deep tissue injury. She is wearing compression wraps to the left lower extremity, she will place the right lower extremity compression wraps on when she gets home. She will be out of town over the weekend and return next week and follow-up appointment. She completed her doxycycline this morning 05/03/18 on evaluation today patient appears to be doing very well in regard to her right lateral ankle ulcer  in general. At least she's showing some signs of improvement in this regard. Unfortunately she has some additional injury to the left lateral malleolus region which appears to be new likely even over the past several days. Again this determination is based on the overall appearance. With that being said the patient is obviously frustrated about this currently. 05/10/18-She is here in follow-up evaluation for bilateral lateral malleolar ulcers. She states she has purchased offloading shoes/boots and they will arrive tomorrow. She was asked to bring them in the office at next week's appointment so her provider is aware of product being utilized. She continues to sleep on right or left side, she has been encouraged to sleep on her back. The right lateral malleolus ulcer is precariously close to peri-osteum; will order xray. The left lateral malleolus ulcer is improved. Will switch back to santyl; she will follow up next week. 05/17/18 on evaluation today patient actually appears to be doing  very well in regard to her malleolus her ulcers compared to last time I saw them. She does not seem to have as much in the way of contusion at this point which is great news. With that being said she does continue to have discomfort and I do believe that she is still continuing to benefit from the offloading/pressure reducing boots that were recommended. I think this is the key to trying to get this to heal up completely. 05/24/18 on evaluation today patient actually appears to be doing worse at this point in time unfortunately compared to her last week's evaluation. She is having really no increased pain which is good news unfortunately she does have more maceration in your theme and noted surrounding the right lateral ankle the left lateral ankle is not really is erythematous I do not see signs of the overt cellulitis on that side. Unfortunately the wounds do not seem to have shown any signs of improvement since the last evaluation. She also has significant swelling especially on the right compared to previous some of this may be due to infection however also think that she may be served better while she has these wounds by compression wrapping versus continuing to use the Juxta-Lite for the time being. Especially with the amount of drainage that she is experiencing at this point. No fevers, chills, nausea, or vomiting noted at this time. 05/31/18 on evaluation today patient appears to actually be doing better in regard to her right lateral lower extremity ulcer specifically on the malleolus region. She has been tolerating the antibiotic without complication. With that being said she still continues to have issues but a little bit of redness although nothing like she what she was experiencing previous. She still continues to pressure to her ankle area she did get the problem on offloading boots unfortunately she will not wear them she states there too uncomfortable and she can't get in and out of the  bed. Nonetheless at this point her wounds seem to be continually getting worse which is not what we want I'm getting somewhat concerned about her progress and how things are going to proceed if we do not intervene in some way shape or form. I therefore had a very lengthy conversation today about offloading yet again and even made a specific suggestion for switching her to a memory foam mattress and even gave the information for a specific one that they could look at getting if it was something that they were interested in considering. She does not want to be considered for  a hospital bed air mattress although honestly insurance would not cover it that she does not have any wounds on her trunk. 06/14/18 on evaluation today both wounds over the bilateral lateral malleolus her ulcers appear to be doing better there's no evidence of pressure injury at this point. She did get the foam mattress for her bed and this does seem to have been extremely beneficial for her in my pinion. Her daughter states that she is having difficulty getting out of bed because of how soft it is. The patient also relates this to be. Nonetheless I do feel like she's actually doing better. Unfortunately right after and around the time she was getting the mattress she also sustained a fall when she got up to go pick up the phone and ended up injuring her right elbow she has 18 sutures in place. We are not caring for this currently although home health is going to be taking the sutures out shortly. Nonetheless this may be something that we need to evaluate going forward. It depends on how well it has or has not healed in the end. She also recently saw an orthopedic specialist for an injection in the right shoulder just before her fall unfortunately the fall seems to have worsened her pain. 06/21/18 on evaluation today patient appears to be doing about the same in regard to her lateral malleolus ulcers. Both appear to be just a little  bit deeper but again we are clinging away the necrotic and dead tissue which I think is why this is progressing towards a deeper realm as opposed to improving from my measurement standpoint in that regard. Nonetheless she has been tolerating the dressing changes she absolutely hates the memory foam mattress topper that was obtained for her nonetheless Maiya, Sterling [name] ([id number]) I do believe this is still doing excellent as far as taking care of excess pressure in regard to the lateral malleolus regions. She in fact has no pressure injury that I see whereas in weeks past it was week by week I was constantly seeing new pressure injuries. Overall I think it has been very beneficial for her. 07/03/18; patient arrives in my clinic today. She has deep punched out areas over her bilateral lateral malleoli. The area on the right has some more depth. We spent a lot of time today talking about pressure relief for these areas. This started when her daughter asked for a prescription for a memory foam mattress. I have never written a prescription for a mattress and I don't think insurances would pay for that on an ordinary bed. In any case he came up that she has foam boots that she refuses to wear. I would suggest going to these before any other offloading issues when she is in bed. They say she is meticulous about offloading this the rest of the day 07/10/18- She is seen in follow-up evaluation for bilateral, lateral malleolus ulcers. There is no improvement in the ulcers. She has purchased and is sleeping on a memory foam mattress/overlay, she has been using the offloading boots nightly over the past week. She has a follow up appointment with vascular medicine at the end of October, in my opinion this follow up should be expedited given her deterioration and suboptimal TBI results. We will order plain film xray of the left ankle as deeper structures are palpable; would consider having MRI, regardless of  xray report(s). The ulcers will be treated with iodoflex/iodosorb, she is unable to safely change the dressings daily  with santyl. 07/19/18 on evaluation today patient appears to be doing in general visually well in regard to her bilateral lateral malleolus ulcers. She has been tolerating the dressing changes without complication which is good news. With that being said we did have an x-ray performed on 07/12/18 which revealed a slight loosen see in the lateral portion of the distal left fibula which may represent artifact but underline lytic destruction or osteomyelitis could not be excluded. MRI was recommended. With that being said we can see about getting the patient scheduled for an MRI to further evaluate this area. In fact we have that scheduled currently for August 20 19,019. 07/26/18 on evaluation today patient's wound on the right lateral ankle actually appears to be doing fairly well at this point in my pinion. She has made some good progress currently. With that being said unfortunately in regard to the left lateral ankle ulcer this seems to be a little bit more problematic at this time. In fact as I further evaluated the situation she actually had bone exposed which is the first time that's been the case in the bone appear to be necrotic. Currently I did review patient's note from Dr. Bunnie Domino office with [facility]. He stated that ABI was 1.26 on the right and 0.95 on the left with good waveforms. Her perfusion is stable not reduced from previous studies and her digital waveforms were pretty good particularly on the right. His conclusion upon review of the note was that there was not much she could do to improve her perfusion and he felt she was adequate for wound healing. His suggestion was that she continued to see [place] and consider a synthetic skin graft if there was no underlying infection. He plans to see her back in six months or as needed. 08/01/18 on evaluation  today patient appears to be doing better in regard to her right lateral ankle ulcer. Her left lateral ankle ulcer is about the same she still has bone involvement in evidence of necrosis. There does not appear to be evidence of infection at this time On the right lateral lower extremity. I have started her on the Augmentin she picked this up and started this yesterday. This is to get her through until she sees infectious disease which is scheduled for 08/12/18. 08/06/18 on evaluation today patient appears to be doing rather well considering my discussion with patient's daughter at the end of last week. The area which was marked where she had erythema seems to be improved and this is good news. With that being said overall the patient seems to be making good improvement when it comes to the overall appearance of the right lateral ankle ulcer although this has been slow she at least is coming around in this regard. Unfortunately in regard to the left lateral ankle ulcer this is osteomyelitis based on the pathology report as well is bone culture. Nonetheless we are still waiting CT scan. Unfortunately the MRI we originally ordered cannot be performed as the patient is a pacemaker which I had overlooked. Nonetheless we are working on the CT scan approval and scheduling as of now. She did go to the hospital over the weekend and was placed on IV Cefzo for a couple of days. Fortunately this seems to have improved the erythema quite significantly which is good news. There does not appear to be any evidence of worsening infection at this time. She did have some bleeding after the last debridement therefore I did  not perform any sharp debridement in regard to left lateral ankle at this point. Patient has been approved for a snap vac for the right lateral ankle. 08/14/18; the patient with wounds over her bilateral lateral malleoli. The area on the right actually looks quite good. Been using a snap back on this area.  Healthy granulation and appears to be filling in. Unfortunately the area on the left is really problematic. 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 Also noted to have cellulitis. She saw Dr. Novella Olive of infectious disease today and was put on linezolid. We are able to verify this with her pharmacy. She is completed the Augmentin that she was already on. We've been using Iodoflex to this [name], [name] ([id number]) area 08/23/18 on evaluation today patient's wounds both actually appear to be doing better compared to my prior evaluations. Fortunately she showing signs of good improvement in regard to the overall wound status especially where were using the snap vac on the right. In regard to left lateral malleolus the wound bed actually appears to be much cleaner than previously noted. I do not feel any phone directly probed during evaluation today and though there is tendon noted this does not appear to be necrotic it's actually fairly good as far as the overall appearance of the tendon is concerned. In general the wound bed actually appears to be doing significantly better than it was previous. Patient is currently in the care of [name] and I did review that note today. He actually has her on two weeks of linezolid and then following the patient will be on 1-2 months of Keflex. That is the plan currently. She has been on antibiotics therapy as prescribed by myself initially starting on July 30, 2018 and has been on that continuously up to this point. 08/30/18 on evaluation today patient actually appears to be doing much better in regard to her right lateral malleolus ulcer. She has been tolerating the dressing changes specifically the snap vac without complication although she did have some issues with the seal currently. Apparently there was some trouble with getting it to maintain over the past week past  Sunday. Nonetheless overall the wound appears better in regard to the right lateral malleolus region. In regard to left lateral malleolus this actually show some signs of additional granulation although there still tendon noted in the base of the wound this appears to be healthy not necrotic in any way whatsoever. We are considering potentially using a snap vac for the left lateral malleolus as well the product wrap from [employer], [place], was present in the clinic today we're going to see this patient I did have her come in with me after obtaining consent from the patient and her daughter in order to look at the wound and see if there's any recommendation one way or another as to whether or not they felt the snapback could be beneficial for the left lateral malleolus region. But the conclusion was that it might be but that this is definitely a little bit deeper wound than what traditionally would be utilized for a snap vac. 09/06/18 on evaluation today patient actually appears to be doing excellent in my pinion in regard to both ankle ulcers. She has been tolerating the dressing changes without complication which is great news. Specifically we have been using the snap vac. In regard to the right ankle I'm not even sure that this  is going to be necessary for today and following as the wound has filled in quite nicely. In regard to the left ankle I do believe that we're seeing excellent epithelialization from the edge as well as granulation in the central portion the tendon is still exposed but there's no evidence of necrotic bone and in general I feel like the patient has made excellent progress even compared to last week with just one week of the snap vac. 09/11/18; this is a patient who has wounds on her bilateral lateral malleoli. Initially both of these were deep stage IV wounds in the setting of chronic arterial insufficiency. She has been revascularized. As I understand think she been using snap vacs  to both of these wounds however the area on the right became more superficial and currently she is only using it on the left. Using silver collagen on the right and silver collagen under the back on the left I believe 09/19/18 on evaluation today patient actually appears to be doing very well in regard to her lateral malleolus or ulcers bilaterally. She has been tolerating the dressing changes without complication. Fortunately there does not appear to be any evidence of infection at this time. Overall I feel like she is improving in an excellent manner and I'm very pleased with the fact that everything seems to be turning towards the better for her. This has obviously been a long road. 09/27/18 on evaluation today patient actually appears to be doing very well in regard to her bilateral lateral malleolus ulcers. She has been tolerating the dressing changes without complication. Fortunately there does not appear to be any evidence of infection at this time which is also great news. No fevers, chills, nausea, or vomiting noted at this time. Overall I feel like she is doing excellent with the snap vac on the left malleolus. She had 40 mL of fluid collection over the past week. 10/04/18 on evaluation today patient actually appears to be doing well in regard to her bilateral lateral malleolus ulcers. She continues to tolerate the dressing changes without complication. One issue that I see is the snap vac on the left lateral malleolus which appears to have sealed off some fluid underlying this area and has not really allowed it to heal to the degree that I would like to see. For that reason I did suggest at this point we may want to pack a small piece of packing strip into this region to allow it to more effectively wick out fluid. 10/11/18 in general the patient today does not feel that she has been doing very well. She's been a little bit lethargic and subsequently is having bodyaches as well according to  what she tells me today. With that being said overall she has been concerned with the fact that something may be worsening although to be honest her wounds really have not been appearing poorly. She does have a new ulcer on her left heel unfortunately. This may be pressure related. Nonetheless it seems to me to have potentially started at least as a blister I do not see any evidence of deep tissue injury. In regard to the left ankle the snap vac still seems to be causing the ceiling off of the deeper part of the wound which is in turn trapping fluid. I'm not extremely pleased with the overall appearance as far as progress from last week to this week therefore I'm gonna discontinue [name], [name] ([id number]) the snap vac at this point. 10/18/18 patient  unfortunately this point has not been feeling well for the past several days. She was seen by Grayland Ormond her primary care provider who is a Librarian, academic at [facility]. Subsequently she states that she's been very weak and generally feeling malaise. No fevers, chills, nausea, or vomiting noted at this time. With that being said bloodwork was performed at the PCP office on the 11th of this month which showed a white blood cell count of 10.7. This was repeated today and shows a white blood cell count of 12.4. This does show signs of worsening. Coupled with the fact that she is feeling worse and that her left ankle wound is not really showing signs of improvement I feel like this is an indication that the osteomyelitis is likely exacerbating not improving. Overall I think we may also want to check her C-reactive protein and sedimentation rate. Actually did call [facility] office this afternoon while the patient was in the office here with me. Subsequently based on the findings we discussed treatment possibilities and I think that it is appropriate for [place] to go ahead and initiate treatment with doxycycline which I'm going to do.  Subsequently he did agree to see about adding a CRP and sedimentation rate to her orders. If that has not already been drawn to where they can run it they will contact the patient she can come back to have that check. They are in agreement with plan as far as the patient and her daughter are concerned. Nonetheless also think we need to get in touch with Dr. Henreitta Leber office to see about getting the patient scheduled with him as soon as possible. 11/08/18 on evaluation today patient presents for follow-up concerning her bilateral foot and ankle ulcers. I did do an extensive review of her chart in epic today. Subsequently she was seen by [name] he did initiate Cefepime IV antibiotic therapy. Subsequently she had some issues with her PICC line this had to be removed because it was coiled and then replaced. Fortunately that was now settled. Unfortunately she has continued have issues with her left heel as well as the issues that she is experiencing with her bilateral lateral malleolus regions. I do believe however both areas seem to be doing a little bit better on evaluation today which is good news. No fevers, chills, nausea, or vomiting noted at this time. She actually has an angiogram schedule with Dr. dew on this coming Monday, November 11, 2018. Subsequently the patient states that she is feeling much better especially than what she was roughly 2 weeks ago. She actually had to cancel an appointment because she was feeling so poorly. No fevers, chills, nausea, or vomiting noted at this time. 11/15/18 on evaluation today patient actually is status post having had her angiogram with Dr. dew Monday, four days ago. 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 He had to go and work on several areas of the vasculature fortunately he was able to obtain no more than a 30% residual stenosis throughout post procedure. I reviewed this note today. I think this will definitely help with  healing at this time. Fortunately there does not appear to be any signs of infection and I do feel like ratio already has a better appearance to it. 11/22/18 upon evaluation today patient actually appears to be doing very well in regard to her wounds in general. The right lateral malleolus looks excellent the heel looks better in the left  lateral malleolus also appears to be doing a little better. With that being said the right second toe actually appears to be open and training we been watching this is been dry and stable but now is open. 12/03/2018 Seen today for follow-up and management of multiple bilateral lower extremity wounds. New pressure injury of the great toe which is closed at this time. Wound of the right distal second toe appears larger today with deep undermining and a pocket of fluid present within the undermining region. Left and right malleolus is wounds are stable today with no signs and symptoms of infection.Denies any needs or concerns during exam today. 12/13/18 on evaluation today patient appears to be doing somewhat better in regard to her left heel ulcer. She also seems to be completely healed in regard to the right lateral malleolus ulcer. The left malleolus ulcer is smaller what unfortunately the wounds which are new over the first and second toes of the right foot are what are most concerning at this point especially the second. Both areas did require sharp debridement today. 12/20/18 on evaluation today patient's wound actually appears to be doing better in regard to left lateral ankle and her right lateral ankle continues to remain healed. The hill ulcer on the left is improved. She does have improvement noted as well in regard to both toe ulcers. Overall I'm very pleased in this regard. No fevers, chills, nausea, or vomiting noted at this time. 12/23/18 on evaluation today patient is seen after she had her toenails trimmed at the podiatrist office due to issues with  her right great toe. There was what appeared to be dark eschar on the surface of the wound which had her in the podiatrist concerned. Nonetheless as I remember that during the last office visit I had utilize silver nitrate of this area I was much less concerned about the situation. Subsequently I was able to clean off much of this tissue without any complication today. This does not appear to show any signs of infection and actually look somewhat better compared to last time post debridement. Her second toe on the right foot actually had callous over and there did appear still be some fluid underneath this that would require debridement today. [name], [name] ([id number]) 12/27/18 on evaluation today patient actually appears to be showing signs of improvement at all locations. Even the left lateral ankle although this is not quite as great as the other sites. Fortunately there does not appear to be any signs of infection at this time and both of her toes on the right foot seem to be showing signs of improvement which is good news and very pleased in this regard. 01/03/19 on evaluation today patient appears to be doing better for the most part in regard to her wounds in particular. There does not appear to be any evidence of infection at this time which is good news. Fortunately there is no sign of really worsening anywhere except for the right great toe which she does have what appears to be a bruise/deep tissue injury which is very superficial and already resolving. I'm not sure where this came from I questioned her extensively and she does not recall what may have happened with this. Other than that the patient seems to be doing well even the left lateral ankle ulcer looks good and is getting smaller. 01/10/19 on evaluation today patient appears to be doing well in regard to her left heel wound and both of her toe wounds. Overall  I feel like there is definitely improvement here and I'm happy in  that regard. With that being said unfortunately she is having issues with the left lateral malleolus ulcer which unfortunately still has a lot of depth to it. This is gonna be a very difficult wound for [place] to be able to truly get to heal. I may want to consider some type of skin substitute to see if this would be of benefit for her. I'll discuss this with her more the next visit most likely. This was something I thought about more at the end of the visit when I was Artie out of the room and the patient had been discharged. 01/17/19 on evaluation today patient appears to be doing very well in regard to her wounds in general. She's been making excellent progress at this time. Fortunately there's no sign of infection at this time either. No fevers, chills, nausea, or vomiting noted at this time. The biggest issue is still her left lateral malleolus where it appears to be doing well and is getting smaller but still shows a small corner where this is deeper and goes down into what appears to be the joint space. Nonetheless this is taking much longer to heal although it still looks better in smaller than previous evaluations. 01/24/19 on evaluation today patient's wounds actually appear to be doing rather well in general overall. She did require some sharp debridement in regard to the right great toe but everything else appears to be doing excellent no debridement was even necessary. No fevers, chills, nausea, or vomiting noted at this time. 01/31/19 on evaluation today patient actually appears to be doing much better in regard to her left foot wound on the heel as well as the ankle. The right great toe appears to be a little bit worse today this had callous over and trapped a lot of fluid underneath. Fortunately there's no signs of infection at any site which is great news. 02/07/19 on evaluation today patient actually appears to be doing decently well in regard to all of her ulcers at this point. No sharp  debridement was required she is a little bit of hyper granulation in regard to the left lateral ankle as well as the left heel but the hill itself is almost completely healed which is excellent news. Overall been very pleased in this regard. 02/14/19 on evaluation today patient actually appears to be doing very well in regard to her ulcers on the right first toe, left lateral malleolus, and left heel. In fact the heel is almost completely healed at this point. The patient does not show any signs of infection which is good news. Overall very pleased with how things have progressed. 04/18/19 Telehealth Evaluation During the COVID-19 National Emergency: Verbal Consent: Obtained from patient Allergies: reviewed and the active list is current. Medication changes: patient has no current medication changes. COVID-19 Screening: 1. Have you traveled internationally or on a cruise ship in the last 14 dayso No 2. Have you had contact with someone with or under investigation for COVID-19o No 3. Have you had a fever, cough, sore throat, or experiencing shortness of breatho No on evaluation today actually did have a visit with this patient through a telehealth encounter with her home health nurse. Subsequently it was noted that the patient actually appears to be doing okay in regard to her wounds both the right great toe as well as the left lateral malleolus have shown signs of improvement although this in your theme  around the left lateral malleolus there eschar coverings for both locations. The question is whether or not they are actually close and whether or not home health needs to discharge the patient or not. Nonetheless my concern is this point obviously is that without actually seeing her and being able to evaluate this directly I cannot ensure that she is completely healed which is the question that I'm being asked. 04/22/19 on evaluation today patient presents for her first evaluation since last time I  saw her which was actually March 13, [name], [name]. ([id number]) 2020. I did do a telehealth visit last week in which point it was questionable whether or not she may be healed and had to bring her in today for confirmation. With that being said she does seem to be doing quite well at this point which is good news. There does not appear to be any drainage in the deed I believe her wounds may be healed. Readmission: 09/04/2019 on evaluation today patient appears to be doing unfortunately somewhat more poorly in regard to her left foot ulcer secondary to a wound that began on 08/21/2019 at least when she first noticed this. Fortunately she has not had any evidence of active infection at this time. Systemically. I also do not necessarily see any evidence of infection at the blister/wound site on the first metatarsal head plantar aspect. This almost appears to be something that may have just rubbed inappropriately causing this to breakdown. They did not want a wait too long to come in to be seen as again she had significant issues in the past with wounds that took quite a while to heal in fact it was close to 2 years. Nonetheless this does not appear to be quite that bad but again we do need to remove some of the necrotic tissue from the surface of the wound to tell exactly the extent. She does not appear to have any significant arterial disease at this point and again her last ABIs and TBI's are recorded above in the alert section her left ABI was 1.27 with a TBI of 0.72 to the right ABI 1.08 with a TBI of 0.39. Other than this the patient has been doing quite well since I last saw her and that was in May 2020. 09/11/2019 on evaluation today patient appeared to be doing very well with regard to her plantar foot ulcer on the left. In fact this appears to be almost completely healed which is awesome. That is after just 1 week of intervention. With that being said there is no signs of active infection  at this time. 09/18/2019 on evaluation today patient actually appears to be doing excellent in fact she is completely healed based on what I am seeing at this point. Fortunately there is no signs of active infection at this time and overall patient is very pleased to hear that this area has healed so quickly. Patient History Information obtained from Patient. Family History Cancer - Father,Siblings, Heart Disease - Siblings, No family history of Diabetes, Hereditary Spherocytosis, Hypertension, Kidney Disease, Lung Disease, Seizures, Stroke, Thyroid Problems, Tuberculosis. Social History Never smoker, Marital Status - Married, Alcohol Use - Never, Drug Use - No History, Caffeine Use - Rarely. Medical History Eyes Patient has history of Cataracts - surgery Cardiovascular Patient has history of Congestive Heart Failure, Hypertension, Peripheral Arterial Disease Denies history of Angina, Arrhythmia, Coronary Artery Disease, Deep Vein Thrombosis, Hypotension, Myocardial Infarction, Peripheral Venous Disease, Phlebitis, Vasculitis Integumentary (Skin) Denies history of History  of Burn, History of pressure wounds Musculoskeletal Patient has history of Osteoarthritis Denies history of Gout, Rheumatoid Arthritis, Osteomyelitis Neurologic Patient has history of Neuropathy Denies history of Dementia, Quadriplegia, Paraplegia, Seizure Disorder Oncologic Denies history of Received Chemotherapy, Received Radiation Review of Systems (ROS) Constitutional Symptoms (General Health) Denies complaints or symptoms of Fatigue, Fever, Chills, Marked Weight Change. Respiratory Denies complaints or symptoms of Chronic or frequent coughs, Shortness of Breath. RINNAH, BEILSTEIN ([id number]) Cardiovascular Denies complaints or symptoms of Chest pain, LE edema. Psychiatric Denies complaints or symptoms of Anxiety, Claustrophobia. Objective Constitutional Well-nourished and well-hydrated in no acute  distress. Vitals Time Taken: 10:30 AM, Height: 65 in, Weight: 155 lbs, BMI: 25.8, Temperature: 98.5 F, Pulse: 81 bpm, Respiratory Rate: 16 breaths/min, Blood Pressure: 130/54 mmHg. Respiratory normal breathing without difficulty. Psychiatric this patient is able to make decisions and demonstrates good insight into disease process. Alert and Oriented x 3. pleasant and cooperative. General Notes: Shows complete epithelization there is some discoloration right under the skin but nothing that seems to indicate that there is a wound opening at this point. There was really no significant callus buildup either she is doing quite well in my opinion. Integumentary (Hair, Skin) Wound #6 status is Healed - Epithelialized. Original cause of wound was Gradually Appeared. The wound is located on the Left,Plantar Metatarsal head first. The wound measures 0cm length x 0cm width x 0cm depth; 0cm^2 area and 0cm^3 volume. There is Fat Layer (Subcutaneous Tissue) Exposed exposed. There is no tunneling or undermining noted. There is a small amount of sanguinous drainage noted. The wound margin is indistinct and nonvisible. There is large (67-100%) red granulation within the wound bed. There is no necrotic tissue within the wound bed. Assessment Active Problems ICD-10 Lymphedema, not elsewhere classified Venous insufficiency (chronic) (peripheral) Other specified peripheral vascular diseases Non-pressure chronic ulcer of other part of left foot with fat layer exposed Welshans, [name] ([id number]) Plan Off-Loading: Open toe surgical shoe to: - additional 2 weeks Home Health: D/C [facility] Discharge From [facility] Services: Discharge from [facility] complete 1. At this time again recommend that we discontinue wound care services as the patient appears to be doing very well. 2. I am going to recommend as well that we go ahead and have her continue with the offloading shoe for an  additional 2 weeks and then she should be able to get into her normal shoe this is when she is outside of her house walking around. When she is at home I think she is okay to wear a sock and normal bedroom shoe. Patient will follow-up as needed if anything changes or worsens. Electronic Signature(s) Signed: 09/18/2019 11:26:47 AM By: Worthy Keeler PA-C Entered By: Worthy Keeler on 09/18/2019 11:26:47 [name] ([id number]) -------------------------------------------------------------------------------- ROS/PFSH Details Patient Name: [name] Date of Service: 09/18/2019 10:30 AM Medical Record Number: [id number] Patient Account Number: [id number] Date of Birth/Sex: 1923/09/12 (85 y.o. F) Treating RN: Cornell Barman Primary Care Provider: Grayland Ormond Other Clinician: Referring Provider: Grayland Ormond Treating Provider/Extender: Melburn Hake, HOYT Weeks in Treatment: 2 Information Obtained From Patient Constitutional Symptoms (General Health) Complaints and Symptoms: Negative for: Fatigue; Fever; Chills; Marked Weight Change Respiratory Complaints and Symptoms: Negative for: Chronic or frequent coughs; Shortness of Breath Cardiovascular Complaints and Symptoms: Negative for: Chest pain; LE edema Medical History: Positive for: Congestive Heart Failure; Hypertension; Peripheral Arterial Disease Negative for: Angina; Arrhythmia; Coronary Artery Disease; Deep Vein Thrombosis; Hypotension; Myocardial Infarction;  Peripheral Venous Disease; Phlebitis; Vasculitis Psychiatric Complaints and Symptoms: Negative for: Anxiety; Claustrophobia Eyes Medical History: Positive for: Cataracts - surgery Integumentary (Skin) Medical History: Negative for: History of Burn; History of pressure wounds Musculoskeletal Medical History: Positive for: Osteoarthritis Negative for: Gout; Rheumatoid Arthritis; Osteomyelitis Neurologic Medical History: Positive for:  Neuropathy Negative for: Dementia; Quadriplegia; Paraplegia; Seizure Disorder ALICIAH, SANTOY ([id number]) Oncologic Medical History: Negative for: Received Chemotherapy; Received Radiation HBO Extended History Items Eyes: Cataracts Immunizations Pneumococcal Vaccine: Received Pneumococcal Vaccination: Yes Implantable Devices None Family and Social History Cancer: Yes - Father,Siblings; Diabetes: No; Heart Disease: Yes - Siblings; Hereditary Spherocytosis: No; Hypertension: No; Kidney Disease: No; Lung Disease: No; Seizures: No; Stroke: No; Thyroid Problems: No; Tuberculosis: No; Never smoker; Marital Status - Married; Alcohol Use: Never; Drug Use: No History; Caffeine Use: Rarely; Financial Concerns: No; Food, Clothing or Shelter Needs: No; Support System Lacking: No; Transportation Concerns: No Physician Affirmation I have reviewed and agree with the above information. Electronic Signature(s) Signed: 09/18/2019 5:55:45 PM By: Gretta Cool, BSN, RN, CWS, Kim RN, BSN Signed: 09/18/2019 6:39:08 PM By: Worthy Keeler PA-C Entered By: Worthy Keeler on 09/18/2019 11:24:55 [name], [name] ([id number]) -------------------------------------------------------------------------------- SuperBill Details Patient Name: [name] Date of Service: 09/18/2019 Medical Record Number: [id number] Patient Account Number: [id number] Date of Birth/Sex: May 30, 1923 (85 y.o. F) Treating RN: Cornell Barman Primary Care Provider: Grayland Ormond Other Clinician: Referring Provider: Grayland Ormond Treating Provider/Extender: Melburn Hake, HOYT Weeks in Treatment: 2 Diagnosis Coding ICD-10 Codes Code Description I89.0 Lymphedema, not elsewhere classified I87.2 Venous insufficiency (chronic) (peripheral) I73.89 Other specified peripheral vascular diseases L97.522 Non-pressure chronic ulcer of other part of left foot with fat layer exposed Facility Procedures CPT4 Code: [id number] Description: [phone]  - WOUND CARE VISIT-LEV 2 EST PT Modifier: Quantity: 1 Physician Procedures CPT4 Code: [id number] Description: [id number] - WC PHYS LEVEL 3 - EST PT ICD-10 Diagnosis Description I89.0 Lymphedema, not elsewhere classified I87.2 Venous insufficiency (chronic) (peripheral) I73.89 Other specified peripheral vascular diseases L97.522 Non-pressure chronic ulcer  of other part of left foot with fat Modifier: layer exposed Quantity: 1 Electronic Signature(s) Signed: 09/18/2019 11:27:42 AM By: Worthy Keeler PA-C Previous Signature: 09/18/2019 11:27:11 AM Version By: Worthy Keeler PA-C Entered By: Worthy Keeler on 09/18/2019 11:27:41

## 2019-09-19 NOTE — Progress Notes (Signed)
[name], [name] ([id number]) Visit Report for 09/18/2019 Arrival Information Details Patient Name: [name], [name] Date of Service: 09/18/2019 10:30 AM Medical Record Number: [id number] Patient Account Number: [id number] Date of Birth/Sex: 01/01/1923 (85 y.o. F) Treating RN: Harold Barban Primary Care Harpreet Pompey: Grayland Ormond Other Clinician: Referring Sherial Ebrahim: Grayland Ormond Treating Ranee Peasley/Extender: Melburn Hake, HOYT Weeks in Treatment: 2 Visit Information History Since Last Visit Added or deleted any medications: No Patient Arrived: Walker Any new allergies or adverse reactions: No Arrival Time: 10:32 Had a fall or experienced change in No Accompanied By: daughter activities of daily living that may affect Transfer Assistance: None risk of falls: Patient Identification Verified: Yes Hospitalized since last visit: No Secondary Verification Process Yes Has Dressing in Place as Prescribed: Yes Completed: Has Footwear/Offloading in Place as Yes Patient Has Alerts: Yes Prescribed: Patient Alerts: ABI 17/20 L 1.27 R Left: Surgical Shoe with Pressure 1.08 Relief Insole TBI L .72 R .39 Pain Present Now: No Electronic Signature(s) Signed: 09/18/2019 4:37:29 PM By: Harold Barban Entered By: Harold Barban on 09/18/2019 10:33:32 Urick, [name] ([id number]) -------------------------------------------------------------------------------- Clinic Level of Care Assessment Details Patient Name: [name] Date of Service: 09/18/2019 10:30 AM Medical Record Number: [id number] Patient Account Number: [id number] Date of Birth/Sex: [birth year] (85 y.o. F) Treating RN: Cornell Barman Primary Care Ricky Gallery: Grayland Ormond Other Clinician: Referring Melyna Huron: Grayland Ormond Treating Adaeze Better/Extender: Melburn Hake, HOYT Weeks in Treatment: 2 Clinic Level of Care Assessment Items TOOL 4 Quantity Score []  - Use when only an EandM is performed on FOLLOW-UP visit  0 ASSESSMENTS - Nursing Assessment / Reassessment []  - Reassessment of Co-morbidities (includes updates in patient status) 0 X- 1 5 Reassessment of Adherence to Treatment Plan ASSESSMENTS - Wound and Skin Assessment / Reassessment X - Simple Wound Assessment / Reassessment - one wound 1 5 []  - 0 Complex Wound Assessment / Reassessment - multiple wounds []  - 0 Dermatologic / Skin Assessment (not related to wound area) ASSESSMENTS - Focused Assessment []  - Circumferential Edema Measurements - multi extremities 0 []  - 0 Nutritional Assessment / Counseling / Intervention []  - 0 Lower Extremity Assessment (monofilament, tuning fork, pulses) []  - 0 Peripheral Arterial Disease Assessment (using hand held doppler) ASSESSMENTS - Ostomy and/or Continence Assessment and Care []  - Incontinence Assessment and Management 0 []  - 0 Ostomy Care Assessment and Management (repouching, etc.) PROCESS - Coordination of Care X - Simple Patient / Family Education for ongoing care 1 15 []  - 0 Complex (extensive) Patient / Family Education for ongoing care []  - 0 Staff obtains Programmer, systems, Records, Test Results / Process Orders []  - 0 Staff telephones HHA, Nursing Homes / Clarify orders / etc []  - 0 Routine Transfer to another Facility (non-emergent condition) []  - 0 Routine Hospital Admission (non-emergent condition) []  - 0 New Admissions / Biomedical engineer / Ordering NPWT, Apligraf, etc. []  - 0 Emergency Hospital Admission (emergent condition) X- 1 10 Simple Discharge Coordination ANASTASIJA, COCKBURN ([id number]) []  - 0 Complex (extensive) Discharge Coordination PROCESS - Special Needs []  - Pediatric / Minor Patient Management 0 []  - 0 Isolation Patient Management []  - 0 Hearing / Language / Visual special needs []  - 0 Assessment of Community assistance (transportation, D/C planning, etc.) []  - 0 Additional assistance / Altered mentation []  - 0 Support Surface(s) Assessment (bed,  cushion, seat, etc.) INTERVENTIONS - Wound Cleansing / Measurement X - Simple Wound Cleansing - one wound 1 5 []  - 0 Complex Wound Cleansing - multiple wounds X- 1 5  Wound Imaging (photographs - any number of wounds) []  - 0 Wound Tracing (instead of photographs) X- 1 5 Simple Wound Measurement - one wound []  - 0 Complex Wound Measurement - multiple wounds INTERVENTIONS - Wound Dressings []  - Small Wound Dressing one or multiple wounds 0 []  - 0 Medium Wound Dressing one or multiple wounds []  - 0 Large Wound Dressing one or multiple wounds []  - 0 Application of Medications - topical []  - 0 Application of Medications - injection INTERVENTIONS - Miscellaneous []  - External ear exam 0 []  - 0 Specimen Collection (cultures, biopsies, blood, body fluids, etc.) []  - 0 Specimen(s) / Culture(s) sent or taken to Lab for analysis []  - 0 Patient Transfer (multiple staff / Civil Service fast streamer / Similar devices) []  - 0 Simple Staple / Suture removal (25 or less) []  - 0 Complex Staple / Suture removal (26 or more) []  - 0 Hypo / Hyperglycemic Management (close monitor of Blood Glucose) []  - 0 Ankle / Brachial Index (ABI) - do not check if billed separately X- 1 5 Vital Signs Olenick, [name] ([id number]) Has the patient been seen at the hospital within the last three years: Yes Total Score: 55 Level Of Care: New/Established - Level 2 Electronic Signature(s) Signed: 09/18/2019 5:55:45 PM By: Gretta Cool, BSN, RN, CWS, Kim RN, BSN Entered By: Gretta Cool, BSN, RN, CWS, Kim on 09/18/2019 11:11:51 Felkins, [name] ([id number]) -------------------------------------------------------------------------------- Encounter Discharge Information Details Patient Name: [name] Date of Service: 09/18/2019 10:30 AM Medical Record Number: [id number] Patient Account Number: [id number] Date of Birth/Sex: 11/11/1923 (85 y.o. F) Treating RN: Cornell Barman Primary Care Neeya Prigmore: Grayland Ormond Other  Clinician: Referring Tawonda Legaspi: Grayland Ormond Treating Weldon Nouri/Extender: Melburn Hake, HOYT Weeks in Treatment: 2 Encounter Discharge Information Items Discharge Condition: Stable Ambulatory Status: Walker Discharge Destination: Home Transportation: Private Auto Accompanied By: daughter Schedule Follow-up Appointment: No Clinical Summary of Care: Electronic Signature(s) Signed: 09/18/2019 5:55:45 PM By: Gretta Cool, BSN, RN, CWS, Kim RN, BSN Entered By: Gretta Cool, BSN, RN, CWS, Kim on 09/18/2019 11:13:11 Scherzinger, [name] ([id number]) -------------------------------------------------------------------------------- Lower Extremity Assessment Details Patient Name: [name] Date of Service: 09/18/2019 10:30 AM Medical Record Number: [id number] Patient Account Number: [id number] Date of Birth/Sex: July 09, 1923 (85 y.o. F) Treating RN: Harold Barban Primary Care Emonii Wienke: Grayland Ormond Other Clinician: Referring Myers Tutterow: Grayland Ormond Treating Breckan Cafiero/Extender: STONE III, HOYT Weeks in Treatment: 2 Vascular Assessment Pulses: Dorsalis Pedis Palpable: [Left:Yes] Posterior Tibial Palpable: [Left:Yes] Electronic Signature(s) Signed: 09/18/2019 4:37:29 PM By: Harold Barban Entered By: Harold Barban on 09/18/2019 10:35:52 Matus, [name] ([id number]) -------------------------------------------------------------------------------- Multi Wound Chart Details Patient Name: [name] Date of Service: 09/18/2019 10:30 AM Medical Record Number: [id number] Patient Account Number: [id number] Date of Birth/Sex: [birth year] (85 y.o. F) Treating RN: Cornell Barman Primary Care Braedan Meuth: Grayland Ormond Other Clinician: Referring Ineta Sinning: Grayland Ormond Treating Gumaro Brightbill/Extender: Melburn Hake, HOYT Weeks in Treatment: 2 Vital Signs Height(in): 65 Pulse(bpm): 81 Weight(lbs): 155 Blood Pressure(mmHg): 130/54 Body Mass Index(BMI): 26 Temperature(F): 98.5 Respiratory  Rate 16 (breaths/min): Photos: [N/A:N/A] Wound Location: Left, Plantar Metatarsal head N/A N/A first Wounding Event: Gradually Appeared N/A N/A Primary Etiology: Arterial Insufficiency Ulcer N/A N/A Comorbid History: Cataracts, Congestive Heart N/A N/A Failure, Hypertension, Peripheral Arterial Disease, Osteoarthritis, Neuropathy Date Acquired: 08/21/2019 N/A N/A Weeks of Treatment: 2 N/A N/A Wound Status: Healed - Epithelialized N/A N/A Measurements L x W x D 0x0x0 N/A N/A (cm) Area (cm) : 0 N/A N/A Volume (cm) : 0 N/A N/A % Reduction in Area: 100.00% N/A N/A %  Reduction in Volume: 100.00% N/A N/A Classification: Full Thickness Without N/A N/A Exposed Support Structures Exudate Amount: Small N/A N/A Exudate Type: Sanguinous N/A N/A Exudate Color: red N/A N/A Wound Margin: Indistinct, nonvisible N/A N/A Granulation Amount: Large (67-100%) N/A N/A Granulation Quality: Red N/A N/A Necrotic Amount: None Present (0%) N/A N/A Exposed Structures: Fat Layer (Subcutaneous N/A N/A Tissue) Exposed: Yes Fascia: No Loder, [name] ([id number]) Tendon: No Muscle: No Joint: No Bone: No Epithelialization: Large (67-100%) N/A N/A Treatment Notes Electronic Signature(s) Signed: 09/18/2019 5:55:45 PM By: Gretta Cool, BSN, RN, CWS, Kim RN, BSN Entered By: Gretta Cool, BSN, RN, CWS, Kim on 09/18/2019 11:10:18 Menken, [name] ([id number]) -------------------------------------------------------------------------------- Multi-Disciplinary Care Plan Details Patient Name: [name] Date of Service: 09/18/2019 10:30 AM Medical Record Number: [id number] Patient Account Number: [id number] Date of Birth/Sex: 02/22/1923 (85 y.o. F) Treating RN: Cornell Barman Primary Care Dacian Orrico: Grayland Ormond Other Clinician: Referring Ashya Nicolaisen: Grayland Ormond Treating Murray Durrell/Extender: Sharalyn Ink in Treatment: 2 Active Inactive Electronic Signature(s) Signed: 09/18/2019 5:55:45 PM By:  Gretta Cool, BSN, RN, CWS, Kim RN, BSN Entered By: Gretta Cool, BSN, RN, CWS, Kim on 09/18/2019 11:10:09 Joni Fears [name] ([id number]) -------------------------------------------------------------------------------- Pain Assessment Details Patient Name: [name] Date of Service: 09/18/2019 10:30 AM Medical Record Number: [id number] Patient Account Number: [id number] Date of Birth/Sex: Jul 16, 1923 (85 y.o. F) Treating RN: Harold Barban Primary Care Dwayne Bulkley: Grayland Ormond Other Clinician: Referring Lillyauna Jenkinson: Grayland Ormond Treating Carliss Quast/Extender: Melburn Hake, HOYT Weeks in Treatment: 2 Active Problems Location of Pain Severity and Description of Pain Patient Has Paino No Site Locations Pain Management and Medication Current Pain Management: Electronic Signature(s) Signed: 09/18/2019 4:37:29 PM By: Harold Barban Entered By: Harold Barban on 09/18/2019 10:34:25 Mcpherson, [name] ([id number]) -------------------------------------------------------------------------------- Patient/Caregiver Education Details Patient Name: [name] Date of Service: 09/18/2019 10:30 AM Medical Record Number: [id number] Patient Account Number: [id number] Date of Birth/Gender: 10-Oct-1923 (85 y.o. F) Treating RN: Cornell Barman Primary Care Physician: Grayland Ormond Other Clinician: Referring Physician: Grayland Ormond Treating Physician/Extender: Sharalyn Ink in Treatment: 2 Education Assessment Education Provided To: Patient Education Topics Provided Pressure: Handouts: Preventing Pressure Ulcers Methods: Demonstration, Explain/Verbal Responses: State content correctly Electronic Signature(s) Signed: 09/18/2019 5:55:45 PM By: Gretta Cool, BSN, RN, CWS, Kim RN, BSN Entered By: Gretta Cool, BSN, RN, CWS, Kim on 09/18/2019 11:12:46 Coltrin, [name] ([id number]) -------------------------------------------------------------------------------- Wound Assessment Details Patient Name:  [name] Date of Service: 09/18/2019 10:30 AM Medical Record Number: [id number] Patient Account Number: [id number] Date of Birth/Sex: September 02, 1923 (85 y.o. F) Treating RN: Cornell Barman Primary Care Young Mulvey: Grayland Ormond Other Clinician: Referring Ananya Mccleese: Grayland Ormond Treating Nani Ingram/Extender: Melburn Hake, HOYT Weeks in Treatment: 2 Wound Status Wound Number: 6 Primary Arterial Insufficiency Ulcer Etiology: Wound Location: Left, Plantar Metatarsal head first Wound Healed - Epithelialized Wounding Event: Gradually Appeared Status: Date Acquired: 08/21/2019 Comorbid Cataracts, Congestive Heart Failure, Weeks Of Treatment: 2 History: Hypertension, Peripheral Arterial Disease, Clustered Wound: No Osteoarthritis, Neuropathy Photos Wound Measurements Length: (cm) 0 % Width: (cm) 0 % Depth: (cm) 0 Ep Area: (cm) 0 T Volume: (cm) 0 U Reduction in Area: 100% Reduction in Volume: 100% ithelialization: Large (67-100%) unneling: No ndermining: No Wound Description Full Thickness Without Exposed Support Classification: Structures Wound Margin: Indistinct, nonvisible Exudate Small Amount: Exudate Type: Sanguinous Exudate Color: red Foul Odor After Cleansing: No Slough/Fibrino No Wound Bed Granulation Amount: Large (67-100%) Exposed Structure Granulation Quality: Red Fascia Exposed: No Necrotic Amount: None Present (0%) Fat Layer (Subcutaneous Tissue) Exposed: Yes Tendon Exposed: No Muscle Exposed: No  Joint Exposed: No Bone Exposed: No YAMINA, SPONSELLER ([id number]) Electronic Signature(s) Signed: 09/18/2019 5:55:45 PM By: Gretta Cool, BSN, RN, CWS, Kim RN, BSN Entered By: Gretta Cool, BSN, RN, CWS, Kim on 09/18/2019 11:09:56 Cicalese, [name] ([id number]) -------------------------------------------------------------------------------- [place] Details Patient Name: [name] Date of Service: 09/18/2019 10:30 AM Medical Record Number: [id number] Patient  Account Number: [id number] Date of Birth/Sex: 07/09/1923 (85 y.o. F) Treating RN: Harold Barban Primary Care Billi Bright: Grayland Ormond Other Clinician: Referring Amato Sevillano: Grayland Ormond Treating Zeric Baranowski/Extender: Melburn Hake, HOYT Weeks in Treatment: 2 Vital Signs Time Taken: 10:30 Temperature (F): 98.5 Height (in): 65 Pulse (bpm): 81 Weight (lbs): 155 Respiratory Rate (breaths/min): 16 Body Mass Index (BMI): 25.8 Blood Pressure (mmHg): 130/54 Reference Range: 80 - 120 mg / dl Electronic Signature(s) Signed: 09/18/2019 4:37:29 PM By: Harold Barban Entered By: Harold Barban on 09/18/2019 10:35:41

## 2019-09-22 NOTE — Progress Notes (Signed)
[name], SENDEJO ([id number]) Visit Report for 09/11/2019 Arrival Information Details Patient Name: [name], [name]. Date of Service: 09/11/2019 3:15 PM Medical Record Number: [id number] Patient Account Number: [id number] Date of Birth/Sex: Jan 28, 1923 (85 y.o. F) Treating RN: Cornell Barman Primary Care Vora Clover: Grayland Ormond Other Clinician: Referring Bevin Das: Grayland Ormond Treating Siriyah Ambrosius/Extender: Melburn Hake, HOYT Weeks in Treatment: 1 Visit Information History Since Last Visit Added or deleted any medications: No Patient Arrived: Walker Any new allergies or adverse reactions: No Arrival Time: 15:20 Had a fall or experienced change in No Accompanied By: self activities of daily living that may affect Transfer Assistance: None risk of falls: Patient Identification Verified: Yes Signs or symptoms of abuse/neglect since last visito No Secondary Verification Process Yes Hospitalized since last visit: No Completed: Implantable device outside of the clinic excluding No Patient Has Alerts: Yes cellular tissue based products placed in the center Patient Alerts: ABI 17/20 L 1.27 R since last visit: 1.08 Has Dressing in Place as Prescribed: Yes TBI L .72 R .39 Pain Present Now: No Electronic Signature(s) Signed: 09/11/2019 4:55:32 PM By: Gretta Cool, BSN, RN, CWS, Kim RN, BSN Entered By: Gretta Cool, BSN, RN, CWS, Kim on 09/11/2019 15:21:25 [name] ([id number]) -------------------------------------------------------------------------------- Clinic Level of Care Assessment Details Patient Name: [name] Date of Service: 09/11/2019 3:15 PM Medical Record Number: [id number] Patient Account Number: [id number] Date of Birth/Sex: 1923-07-07 (85 y.o. F) Treating RN: Army Melia Primary Care Elani Delph: Grayland Ormond Other Clinician: Referring Rahkeem Senft: Grayland Ormond Treating Marcia Lepera/Extender: Melburn Hake, HOYT Weeks in Treatment: 1 Clinic Level of Care Assessment  Items TOOL 4 Quantity Score []  - Use when only an EandM is performed on FOLLOW-UP visit 0 ASSESSMENTS - Nursing Assessment / Reassessment X - Reassessment of Co-morbidities (includes updates in patient status) 1 10 X- 1 5 Reassessment of Adherence to Treatment Plan ASSESSMENTS - Wound and Skin Assessment / Reassessment X - Simple Wound Assessment / Reassessment - one wound 1 5 []  - 0 Complex Wound Assessment / Reassessment - multiple wounds []  - 0 Dermatologic / Skin Assessment (not related to wound area) ASSESSMENTS - Focused Assessment []  - Circumferential Edema Measurements - multi extremities 0 []  - 0 Nutritional Assessment / Counseling / Intervention X- 1 5 Lower Extremity Assessment (monofilament, tuning fork, pulses) []  - 0 Peripheral Arterial Disease Assessment (using hand held doppler) ASSESSMENTS - Ostomy and/or Continence Assessment and Care []  - Incontinence Assessment and Management 0 []  - 0 Ostomy Care Assessment and Management (repouching, etc.) PROCESS - Coordination of Care X - Simple Patient / Family Education for ongoing care 1 15 []  - 0 Complex (extensive) Patient / Family Education for ongoing care []  - 0 Staff obtains Programmer, systems, Records, Test Results / Process Orders []  - 0 Staff telephones HHA, Nursing Homes / Clarify orders / etc []  - 0 Routine Transfer to another Facility (non-emergent condition) []  - 0 Routine Hospital Admission (non-emergent condition) []  - 0 New Admissions / Biomedical engineer / Ordering NPWT, Apligraf, etc. []  - 0 Emergency Hospital Admission (emergent condition) X- 1 10 Simple Discharge Coordination [name], [name] ([id number]) []  - 0 Complex (extensive) Discharge Coordination PROCESS - Special Needs []  - Pediatric / Minor Patient Management 0 []  - 0 Isolation Patient Management []  - 0 Hearing / Language / Visual special needs []  - 0 Assessment of Community assistance (transportation, D/C planning, etc.) []  -  0 Additional assistance / Altered mentation []  - 0 Support Surface(s) Assessment (bed, cushion, seat, etc.) INTERVENTIONS - Wound Cleansing /  Measurement X - Simple Wound Cleansing - one wound 1 5 []  - 0 Complex Wound Cleansing - multiple wounds X- 1 5 Wound Imaging (photographs - any number of wounds) []  - 0 Wound Tracing (instead of photographs) X- 1 5 Simple Wound Measurement - one wound []  - 0 Complex Wound Measurement - multiple wounds INTERVENTIONS - Wound Dressings X - Small Wound Dressing one or multiple wounds 1 10 []  - 0 Medium Wound Dressing one or multiple wounds []  - 0 Large Wound Dressing one or multiple wounds []  - 0 Application of Medications - topical []  - 0 Application of Medications - injection INTERVENTIONS - Miscellaneous []  - External ear exam 0 []  - 0 Specimen Collection (cultures, biopsies, blood, body fluids, etc.) []  - 0 Specimen(s) / Culture(s) sent or taken to Lab for analysis []  - 0 Patient Transfer (multiple staff / Civil Service fast streamer / Similar devices) []  - 0 Simple Staple / Suture removal (25 or less) []  - 0 Complex Staple / Suture removal (26 or more) []  - 0 Hypo / Hyperglycemic Management (close monitor of Blood Glucose) []  - 0 Ankle / Brachial Index (ABI) - do not check if billed separately X- 1 5 Vital Signs D[place]anto, [name] ([id number]) Has the patient been seen at the hospital within the last three years: Yes Total Score: 80 Level Of Care: New/Established - Level 3 Electronic Signature(s) Signed: 09/22/2019 8:11:06 AM By: Army Melia Entered By: Army Melia on 09/11/2019 15:49:40 Tiberio, [name] ([id number]) -------------------------------------------------------------------------------- Encounter Discharge Information Details Patient Name: [name] Date of Service: 09/11/2019 3:15 PM Medical Record Number: [id number] Patient Account Number: [id number] Date of Birth/Sex: 04/04/1923 (85 y.o. F) Treating RN: Army Melia Primary Care Riyana Biel: Grayland Ormond Other Clinician: Referring Essense Bousquet: Grayland Ormond Treating Ashwath Lasch/Extender: Melburn Hake, HOYT Weeks in Treatment: 1 Encounter Discharge Information Items Discharge Condition: Stable Ambulatory Status: Walker Discharge Destination: Home Transportation: Private Auto Accompanied By: self Schedule Follow-up Appointment: Yes Clinical Summary of Care: Electronic Signature(s) Signed: 09/22/2019 8:11:06 AM By: Army Melia Entered By: Army Melia on 09/11/2019 15:50:35 Flenniken, [name] ([id number]) -------------------------------------------------------------------------------- Lower Extremity Assessment Details Patient Name: [name] Date of Service: 09/11/2019 3:15 PM Medical Record Number: [id number] Patient Account Number: [id number] Date of Birth/Sex: 1923-03-02 (85 y.o. F) Treating RN: Montey Hora Primary Care Sharicka Pogorzelski: Grayland Ormond Other Clinician: Referring Cecia Egge: Grayland Ormond Treating Brigham Cobbins/Extender: STONE III, HOYT Weeks in Treatment: 1 Edema Assessment Assessed: [Left: No] [Right: No] Edema: [Left: N] [Right: o] Vascular Assessment Pulses: Dorsalis Pedis Palpable: [Left:Yes] Electronic Signature(s) Signed: 09/11/2019 4:46:01 PM By: Montey Hora Entered By: Montey Hora on 09/11/2019 15:29:18 Mckell, [name] ([id number]) -------------------------------------------------------------------------------- Multi Wound Chart Details Patient Name: [name] Date of Service: 09/11/2019 3:15 PM Medical Record Number: [id number] Patient Account Number: [id number] Date of Birth/Sex: 01-Jan-1923 (85 y.o. F) Treating RN: Army Melia Primary Care Lanny Lipkin: Grayland Ormond Other Clinician: Referring Jasia Hiltunen: Grayland Ormond Treating Michaeljohn Biss/Extender: STONE III, HOYT Weeks in Treatment: 1 Vital Signs Height(in): 65 Pulse(bpm): 88 Weight(lbs): 155 Blood Pressure(mmHg): 165/63 Body Mass  Index(BMI): 26 Temperature(F): 98.6 Respiratory Rate 18 (breaths/min): Photos: [N/A:N/A] Wound Location: Left Metatarsal head first - N/A N/A Plantar Wounding Event: Gradually Appeared N/A N/A Primary Etiology: Arterial Insufficiency Ulcer N/A N/A Comorbid History: Cataracts, Congestive Heart N/A N/A Failure, Hypertension, Peripheral Arterial Disease, Osteoarthritis, Neuropathy Date Acquired: 08/21/2019 N/A N/A Weeks of Treatment: 1 N/A N/A Wound Status: Open N/A N/A Measurements L x W x D 0.2x0.1x0.1 N/A N/A (cm) Area (cm) : 0.016 N/A  N/A Volume (cm) : 0.002 N/A N/A % Reduction in Area: 96.20% N/A N/A % Reduction in Volume: 95.20% N/A N/A Classification: Full Thickness Without N/A N/A Exposed Support Structures Exudate Amount: Small N/A N/A Exudate Type: Sanguinous N/A N/A Exudate Color: red N/A N/A Wound Margin: Indistinct, nonvisible N/A N/A Granulation Amount: Large (67-100%) N/A N/A Granulation Quality: Red N/A N/A Necrotic Amount: None Present (0%) N/A N/A Exposed Structures: Fat Layer (Subcutaneous N/A N/A Tissue) Exposed: Yes Fascia: No Kosh, [name] ([id number]) Tendon: No Muscle: No Joint: No Bone: No Epithelialization: Large (67-100%) N/A N/A Treatment Notes Electronic Signature(s) Signed: 09/22/2019 8:11:06 AM By: Army Melia Entered By: Army Melia on 09/11/2019 15:44:32 Beverley, [name] ([id number]) -------------------------------------------------------------------------------- Multi-Disciplinary Care Plan Details Patient Name: [name] Date of Service: 09/11/2019 3:15 PM Medical Record Number: [id number] Patient Account Number: [id number] Date of Birth/Sex: 1923-06-28 (85 y.o. F) Treating RN: Army Melia Primary Care Icis Budreau: Grayland Ormond Other Clinician: Referring Rozell Kettlewell: Grayland Ormond Treating Tecora Eustache/Extender: Melburn Hake, HOYT Weeks in Treatment: 1 Active Inactive Abuse / Safety / Falls / Self Care  Management Nursing Diagnoses: Potential for injury related to falls Goals: Patient/caregiver will identify factors that restrict self-care and home management Date Initiated: 09/04/2019 Target Resolution Date: 10/10/2019 Goal Status: Active Interventions: Assess impairment of mobility on admission and as needed per policy Notes: Orientation to the Wound Care Program Nursing Diagnoses: Knowledge deficit related to the wound healing center program Goals: Patient/caregiver will verbalize understanding of the [place] [facility] Date Initiated: 09/04/2019 Target Resolution Date: 10/10/2019 Goal Status: Active Interventions: Provide education on orientation to the wound center Notes: Wound/Skin Impairment Nursing Diagnoses: Impaired tissue integrity Goals: Ulcer/skin breakdown will have a volume reduction of 30% by week 4 Date Initiated: 09/04/2019 Target Resolution Date: 10/10/2019 Goal Status: Active Interventions: Assess ulceration(s) every visit SHAUNTAY, ADI ([id number]) Notes: Electronic Signature(s) Signed: 09/22/2019 8:11:06 AM By: Army Melia Entered By: Army Melia on 09/11/2019 15:44:23 Giovanetti, [name] ([id number]) -------------------------------------------------------------------------------- Pain Assessment Details Patient Name: [name] Date of Service: 09/11/2019 3:15 PM Medical Record Number: [id number] Patient Account Number: [id number] Date of Birth/Sex: Sep 10, 1923 (85 y.o. F) Treating RN: Cornell Barman Primary Care Rubye Strohmeyer: Grayland Ormond Other Clinician: Referring Janayah Zavada: Grayland Ormond Treating Kollins Fenter/Extender: Melburn Hake, HOYT Weeks in Treatment: 1 Active Problems Location of Pain Severity and Description of Pain Patient Has Paino No Site Locations Pain Management and Medication Current Pain Management: Goals for Pain Management Patient denies pain at this time. Electronic Signature(s) Signed: 09/11/2019 4:55:32 PM By:  Gretta Cool, BSN, RN, CWS, Kim RN, BSN Entered By: Gretta Cool, BSN, RN, CWS, Kim on 09/11/2019 15:21:41 [name] ([id number]) -------------------------------------------------------------------------------- Patient/Caregiver Education Details Patient Name: [name] Date of Service: 09/11/2019 3:15 PM Medical Record Number: [id number] Patient Account Number: [id number] Date of Birth/Gender: Aug 16, 1923 (85 y.o. F) Treating RN: Army Melia Primary Care Physician: Grayland Ormond Other Clinician: Referring Physician: Grayland Ormond Treating Physician/Extender: Sharalyn Ink in Treatment: 1 Education Assessment Education Provided To: Patient Education Topics Provided Wound/Skin Impairment: Handouts: Caring for Your Ulcer Methods: Demonstration, Explain/Verbal Responses: State content correctly Electronic Signature(s) Signed: 09/22/2019 8:11:06 AM By: Army Melia Entered By: Army Melia on 09/11/2019 15:49:52 Liming, [name] ([id number]) -------------------------------------------------------------------------------- Wound Assessment Details Patient Name: [name] Date of Service: 09/11/2019 3:15 PM Medical Record Number: [id number] Patient Account Number: [id number] Date of Birth/Sex: Jul 04, 1923 (85 y.o. F) Treating RN: Montey Hora Primary Care Lynessa Almanzar: Grayland Ormond Other Clinician: Referring Ramces Shomaker: Grayland Ormond Treating Corderius Saraceni/Extender: Melburn Hake, HOYT  Weeks in Treatment: 1 Wound Status Wound Number: 6 Primary Arterial Insufficiency Ulcer Etiology: Wound Location: Left Metatarsal head first - Plantar Wound Open Wounding Event: Gradually Appeared Status: Date Acquired: 08/21/2019 Comorbid Cataracts, Congestive Heart Failure, Weeks Of Treatment: 1 History: Hypertension, Peripheral Arterial Disease, Clustered Wound: No Osteoarthritis, Neuropathy Photos Wound Measurements Length: (cm) 0.2 Width: (cm) 0.1 Depth: (cm) 0.1 Area:  (cm) 0.016 Volume: (cm) 0.002 % Reduction in Area: 96.2% % Reduction in Volume: 95.2% Epithelialization: Large (67-100%) Tunneling: No Undermining: No Wound Description Full Thickness Without Exposed Support Foul Classification: Structures Sloug Wound Margin: Indistinct, nonvisible Exudate Small Amount: Exudate Type: Sanguinous Exudate Color: red Odor After Cleansing: No h/Fibrino No Wound Bed Granulation Amount: Large (67-100%) Exposed Structure Granulation Quality: Red Fascia Exposed: No Necrotic Amount: None Present (0%) Fat Layer (Subcutaneous Tissue) Exposed: Yes Tendon Exposed: No Muscle Exposed: No Joint Exposed: No Bone Exposed: No Vanbenschoten, [name] ([id number]) Electronic Signature(s) Signed: 09/11/2019 4:46:01 PM By: Montey Hora Entered By: Montey Hora on 09/11/2019 15:28:33 Dosh, [name] ([id number]) -------------------------------------------------------------------------------- [place] Details Patient Name: [name] Date of Service: 09/11/2019 3:15 PM Medical Record Number: [id number] Patient Account Number: [id number] Date of Birth/Sex: 30-Oct-1923 (85 y.o. F) Treating RN: Cornell Barman Primary Care Augustine Leverette: Grayland Ormond Other Clinician: Referring Arnice Vanepps: Grayland Ormond Treating Dajon Lazar/Extender: Melburn Hake, HOYT Weeks in Treatment: 1 Vital Signs Time Taken: 15:22 Temperature (F): 98.6 Height (in): 65 Pulse (bpm): 88 Weight (lbs): 155 Respiratory Rate (breaths/min): 18 Body Mass Index (BMI): 25.8 Blood Pressure (mmHg): 165/63 Reference Range: 80 - 120 mg / dl Electronic Signature(s) Signed: 09/11/2019 4:55:32 PM By: Gretta Cool, BSN, RN, CWS, Kim RN, BSN Entered By: Gretta Cool, BSN, RN, CWS, Kim on 09/11/2019 15:24:19

## 2019-12-10 ENCOUNTER — Encounter: Payer: Self-pay | Admitting: Ophthalmology

## 2019-12-10 ENCOUNTER — Other Ambulatory Visit: Payer: Self-pay

## 2019-12-11 ENCOUNTER — Ambulatory Visit: Payer: Medicare Other | Admitting: Podiatry

## 2019-12-12 NOTE — Discharge Instructions (Signed)
INSTRUCTIONS FOLLOWING OCULOPLASTIC SURGERY °AMY M. FOWLER, MD ° °AFTER YOUR EYE SURGERY, THER ARE MANY THINGS THWIHC YOU, THE PATIENT, CAN DO TO ASSURE THE BEST POSSIBLE RESULT FROM YOUR OPERATION.  THIS SHEET SHOULD BE REFERRED TO WHENEVER QUESTIONS ARISE.  IF THERE ARE ANY QUESTIONS NOT ANSWERED HERE, DO NOT HESITATE TO CALL OUR OFFICE AT [phone] OR [phone].  THERE IS ALWAYS [facility] AVAILABLE TO CALL IF QUESTIONS OR PROBLEMS ARISE. ° °VISION: Your vision may be blurred and out of focus after surgery until you are able to stop using your ointment, swelling resolves and your eye(s) heal. This may take 1 to 2 weeks at the least.  If your vision becomes gradually more dim or dark, this is not normal and you need to call our office immediately. ° °EYE CARE: For the first 48 hours after surgery, use ice packs frequently - “20 minutes on, 20 minutes off” - to help reduce swelling and bruising.  Small bags of frozen peas or corn make good ice packs along with cloths soaked in ice water.  If you are wearing a patch or other type of dressing following surgery, keep this on for the amount of time specified by your doctor.  For the first week following surgery, you will need to treat your stitches with great care.  If is OK to shower, but take care to not allow soapy water to run into your eye(s) to help reduce changes of infection.  You may gently clean the eyelashes and around the eye(s) with cotton balls and sterile water, BUT DO NOT RUB THE STITCHES VIGOROUSLY.  Keeping your stitches moist with ointment will help promote healing with minimal scar formation. ° °ACTIVITY: When you leave the surgery center, you should go home, rest and be inactive.  The eye(s) may feel scratchy and keeping the eyes closed will allow for faster healing.  The first week following surgery, avoid straining (anything making the face turn red) or lifting over 20 pounds.  Additionally, avoid bending which causes your head to go below  your waist.  Using your eyes will NOT harm them, so feel free to read, watch television, use the computer, etc as desired.  Driving depends on each individual, so check with your doctor if you have questions about driving. ° °MEDICATIONS:  You will be given a prescription for an ointment to use 4 times a day on your stitches.  You can use the ointment in your eyes if they feel scratchy or irritated.  If you eyelid(s) don’t close completely when you sleep, put some ointment in your eyes before bedtime. ° °EMERGENCY: If you experience SEVERE EYE PAIN OR HEADACHE UNRELIEVED BY TYLENOL OR PERCOCET, NAUSEA OR VOMITING, WORSENING REDNESS, OR WORSENING VISION (ESPECIALLY VISION THAT WA INITIALLY BETTER) CALL [phone] OR [phone] DURING BUSINESS HOURS OR AFTER HOURS. ° °General Anesthesia, Adult, Care After °This sheet gives you information about how to care for yourself after your procedure. Your health care provider may also give you more specific instructions. If you have problems or questions, contact your health care provider. °What can I expect after the procedure? °After the procedure, the following side effects are common: °· Pain or discomfort at the IV site. °· Nausea. °· Vomiting. °· Sore throat. °· Trouble concentrating. °· Feeling cold or chills. °· Weak or tired. °· Sleepiness and fatigue. °· Soreness and body aches. These side effects can affect parts of the body that were not involved in surgery. °Follow these instructions at home: ° °  For at least 24 hours after the procedure:  Have a responsible adult stay with you. It is important to have someone help care for you until you are awake and alert.  Rest as needed.  Do not: ? Participate in activities in which you could fall or become injured. ? Drive. ? Use heavy machinery. ? Drink alcohol. ? Take sleeping pills or medicines that cause drowsiness. ? Make important decisions or sign legal documents. ? Take care of children on your  own. Eating and drinking  Follow any instructions from your health care provider about eating or drinking restrictions.  When you feel hungry, start by eating small amounts of foods that are soft and easy to digest (bland), such as toast. Gradually return to your regular diet.  Drink enough fluid to keep your urine pale yellow.  If you vomit, rehydrate by drinking water, juice, or clear broth. General instructions  If you have sleep apnea, surgery and certain medicines can increase your risk for breathing problems. Follow instructions from your health care provider about wearing your sleep device: ? Anytime you are sleeping, including during daytime naps. ? While taking prescription pain medicines, sleeping medicines, or medicines that make you drowsy.  Return to your normal activities as told by your health care provider. Ask your health care provider what activities are safe for you.  Take over-the-counter and prescription medicines only as told by your health care provider.  If you smoke, do not smoke without supervision.  Keep all follow-up visits as told by your health care provider. This is important. Contact a health care provider if:  You have nausea or vomiting that does not get better with medicine.  You cannot eat or drink without vomiting.  You have pain that does not get better with medicine.  You are unable to pass urine.  You develop a skin rash.  You have a fever.  You have redness around your IV site that gets worse. Get help right away if:  You have difficulty breathing.  You have chest pain.  You have blood in your urine or stool, or you vomit blood. Summary  After the procedure, it is common to have a sore throat or nausea. It is also common to feel tired.  Have a responsible adult stay with you for the first 24 hours after general anesthesia. It is important to have someone help care for you until you are awake and alert.  When you feel hungry,  start by eating small amounts of foods that are soft and easy to digest (bland), such as toast. Gradually return to your regular diet.  Drink enough fluid to keep your urine pale yellow.  Return to your normal activities as told by your health care provider. Ask your health care provider what activities are safe for you. This information is not intended to replace advice given to you by your health care provider. Make sure you discuss any questions you have with your health care provider. Document Revised: 11/23/2017 Document Reviewed: 07/06/2017 Elsevier Patient Education  [place].

## 2019-12-17 ENCOUNTER — Other Ambulatory Visit: Admission: RE | Admit: 2019-12-17 | Payer: Medicare Other | Source: Ambulatory Visit

## 2020-01-22 ENCOUNTER — Ambulatory Visit: Payer: Medicare Other | Admitting: Podiatry

## 2020-01-29 ENCOUNTER — Encounter: Payer: Self-pay | Admitting: Podiatry

## 2020-01-29 ENCOUNTER — Ambulatory Visit (INDEPENDENT_AMBULATORY_CARE_PROVIDER_SITE_OTHER): Payer: Medicare Other | Admitting: Podiatry

## 2020-01-29 ENCOUNTER — Other Ambulatory Visit: Payer: Self-pay

## 2020-01-29 DIAGNOSIS — I739 Peripheral vascular disease, unspecified: Secondary | ICD-10-CM

## 2020-01-29 DIAGNOSIS — M79676 Pain in unspecified toe(s): Secondary | ICD-10-CM

## 2020-01-29 DIAGNOSIS — B351 Tinea unguium: Secondary | ICD-10-CM | POA: Diagnosis not present

## 2020-01-29 NOTE — Progress Notes (Signed)
Complaint:  Visit Type: Patient returns to my office for continued preventative foot care services. Complaint: Patient states" my nails have grown long and thick and become painful to walk and wear shoes".  She is under care for ulcer big toe joint left foot by the wound center.. Patient has been diagnosed with PVD . The patient presents for preventative foot care services. No changes to ROS  Podiatric Exam: Vascular: dorsalis pedis and posterior tibial pulses are weakly  palpable bilateral. Capillary return is immediate. Temperature gradient is WNL. Skin turgor WNL  Sensorium: Normal Semmes Weinstein monofilament test. Normal tactile sensation bilaterally. Nail Exam: Pt has thick disfigured discolored nails with subungual debris noted bilateral entire nail hallux through fifth toenails Ulcer Exam: Ulcer sub 1st MPJ lwft foot which is bandaged.  Orthopedic Exam: Muscle tone and strength are WNL. No limitations in general ROM. No crepitus or effusions noted. Foot type and digits show no abnormalities. Bony prominences are unremarkable. Skin: No Porokeratosis. No infection or ulcers.  Bony prominence over 5th toe right foot.  No infection.  Diagnosis:  Onychomycosis, , Pain in right toe, pain in left toes  Treatment & Plan Procedures and Treatment: Consent by patient was obtained for treatment procedures.   Debridement of mycotic and hypertrophic toenails, 1 through 5 bilateral and clearing of subungual debris. No ulceration, no infection noted. She is under treatment for ulcer left foot.. Return Visit-Office Procedure: Patient instructed to return to the office for a follow up visit prn  for continued evaluation and treatment.    Gardiner Barefoot DPM

## 2020-02-16 ENCOUNTER — Encounter: Payer: Self-pay | Admitting: Ophthalmology

## 2020-02-16 ENCOUNTER — Other Ambulatory Visit: Payer: Self-pay

## 2020-02-18 ENCOUNTER — Other Ambulatory Visit: Payer: Self-pay

## 2020-02-18 ENCOUNTER — Other Ambulatory Visit
Admission: RE | Admit: 2020-02-18 | Discharge: 2020-02-18 | Disposition: A | Discharge status: Home / Self Care | Payer: Medicare Other | Source: Ambulatory Visit | Attending: Ophthalmology | Admitting: Ophthalmology

## 2020-02-18 DIAGNOSIS — Z01812 Encounter for preprocedural laboratory examination: Secondary | ICD-10-CM | POA: Insufficient documentation

## 2020-02-18 DIAGNOSIS — Z20822 Contact with and (suspected) exposure to covid-19: Secondary | ICD-10-CM | POA: Diagnosis not present

## 2020-02-18 LAB — SARS CORONAVIRUS 2 (TAT 6-24 HRS): SARS Coronavirus 2: NEGATIVE

## 2020-02-20 ENCOUNTER — Ambulatory Visit
Admission: RE | Admit: 2020-02-20 | Discharge: 2020-02-20 | Disposition: A | Discharge status: Home / Self Care | Payer: Medicare Other | Attending: Ophthalmology | Admitting: Ophthalmology

## 2020-02-20 ENCOUNTER — Encounter: Payer: Self-pay | Admitting: Ophthalmology

## 2020-02-20 ENCOUNTER — Ambulatory Visit: Payer: Medicare Other | Admitting: Anesthesiology

## 2020-02-20 ENCOUNTER — Encounter
Admission: RE | Disposition: A | Discharge status: Home / Self Care | Payer: Self-pay | Source: Home / Self Care | Attending: Ophthalmology

## 2020-02-20 DIAGNOSIS — D649 Anemia, unspecified: Secondary | ICD-10-CM | POA: Insufficient documentation

## 2020-02-20 DIAGNOSIS — Z888 Allergy status to other drugs, medicaments and biological substances status: Secondary | ICD-10-CM | POA: Diagnosis not present

## 2020-02-20 DIAGNOSIS — Z9049 Acquired absence of other specified parts of digestive tract: Secondary | ICD-10-CM | POA: Diagnosis not present

## 2020-02-20 DIAGNOSIS — H02105 Unspecified ectropion of left lower eyelid: Secondary | ICD-10-CM | POA: Diagnosis not present

## 2020-02-20 DIAGNOSIS — H04522 Eversion of left lacrimal punctum: Secondary | ICD-10-CM | POA: Diagnosis not present

## 2020-02-20 DIAGNOSIS — G629 Polyneuropathy, unspecified: Secondary | ICD-10-CM | POA: Diagnosis not present

## 2020-02-20 DIAGNOSIS — I4891 Unspecified atrial fibrillation: Secondary | ICD-10-CM | POA: Insufficient documentation

## 2020-02-20 DIAGNOSIS — Z950 Presence of cardiac pacemaker: Secondary | ICD-10-CM | POA: Insufficient documentation

## 2020-02-20 DIAGNOSIS — Z8673 Personal history of transient ischemic attack (TIA), and cerebral infarction without residual deficits: Secondary | ICD-10-CM | POA: Insufficient documentation

## 2020-02-20 DIAGNOSIS — M4800 Spinal stenosis, site unspecified: Secondary | ICD-10-CM | POA: Diagnosis not present

## 2020-02-20 DIAGNOSIS — Z882 Allergy status to sulfonamides status: Secondary | ICD-10-CM | POA: Diagnosis not present

## 2020-02-20 DIAGNOSIS — E039 Hypothyroidism, unspecified: Secondary | ICD-10-CM | POA: Insufficient documentation

## 2020-02-20 HISTORY — DX: Unspecified atrial fibrillation: I48.91

## 2020-02-20 HISTORY — DX: Dizziness and giddiness: R42

## 2020-02-20 HISTORY — DX: Polyneuropathy, unspecified: G62.9

## 2020-02-20 HISTORY — PX: ECTROPION REPAIR: SHX357

## 2020-02-20 SURGERY — REPAIR, ECTROPION, EYELID
Anesthesia: General | Site: Eye | Laterality: Left

## 2020-02-20 MED ORDER — ERYTHROMYCIN 5 MG/GM OP OINT: TOPICAL_OINTMENT | OPHTHALMIC | 2 refills | Status: DC

## 2020-02-20 MED ORDER — ERYTHROMYCIN 5 MG/GM OP OINT
TOPICAL_OINTMENT | OPHTHALMIC | Status: DC | PRN
  Administered 2020-02-20: 1 via OPHTHALMIC

## 2020-02-20 MED ORDER — BSS IO SOLN
INTRAOCULAR | Status: DC | PRN
  Administered 2020-02-20: 15 mL

## 2020-02-20 MED ORDER — ACETAMINOPHEN 325 MG PO TABS: 650.0000 mg | ORAL_TABLET | Freq: Four times a day (QID) | ORAL | Status: DC | PRN

## 2020-02-20 MED ORDER — OXYCODONE HCL 5 MG PO TABS: 5.0000 mg | ORAL_TABLET | Freq: Once | ORAL | Status: DC | PRN

## 2020-02-20 MED ORDER — LACTATED RINGERS IV SOLN
10.0000 mL/h | INTRAVENOUS | Status: DC
  Administered 2020-02-20: 10 mL/h via INTRAVENOUS

## 2020-02-20 MED ORDER — ALFENTANIL 500 MCG/ML IJ INJ
INJECTION | INTRAVENOUS | Status: DC | PRN
  Administered 2020-02-20 (×2): 250 ug via INTRAVENOUS

## 2020-02-20 MED ORDER — LIDOCAINE-EPINEPHRINE 2 %-1:100000 IJ SOLN
INTRAMUSCULAR | Status: DC | PRN
  Administered 2020-02-20: 3 mL via OPHTHALMIC

## 2020-02-20 MED ORDER — TETRACAINE HCL 0.5 % OP SOLN
OPHTHALMIC | Status: DC | PRN
  Administered 2020-02-20: 2 [drp] via OPHTHALMIC

## 2020-02-20 MED ORDER — DEXMEDETOMIDINE HCL 200 MCG/2ML IV SOLN
INTRAVENOUS | Status: DC | PRN
  Administered 2020-02-20: 5 ug via INTRAVENOUS

## 2020-02-20 MED ORDER — CIPROFLOXACIN HCL 0.3 % OP SOLN: 1.0000 [drp] | Freq: Four times a day (QID) | OPHTHALMIC | 1 refills | Status: AC

## 2020-02-20 MED ORDER — TRAMADOL HCL 50 MG PO TABS: ORAL_TABLET | ORAL | 0 refills | Status: DC

## 2020-02-20 MED ORDER — OXYCODONE HCL 5 MG/5ML PO SOLN: 5.0000 mg | Freq: Once | ORAL | Status: DC | PRN

## 2020-02-20 SURGICAL SUPPLY — 40 items
APPLICATOR COTTON TIP WD 3 STR (MISCELLANEOUS) ×6 IMPLANT
BLADE SURG 15 STRL LF DISP TIS (BLADE) ×1 IMPLANT
BLADE SURG 15 STRL SS (BLADE) ×2
CORD BIP STRL DISP 12FT (MISCELLANEOUS) ×3 IMPLANT
DRAPE HEAD BAR (DRAPES) ×3 IMPLANT
GAUZE SPONGE 4X4 12PLY STRL (GAUZE/BANDAGES/DRESSINGS) ×3 IMPLANT
GAUZE SPONGE NON-WVN 2X2 STRL (MISCELLANEOUS) ×10 IMPLANT
GLOVE SURG LX 7.0 MICRO (GLOVE) ×4
GLOVE SURG LX STRL 7.0 MICRO (GLOVE) ×2 IMPLANT
MARKER SKIN XFINE TIP W/RULER (MISCELLANEOUS) ×3 IMPLANT
NDL FILTER BLUNT 18X1 1/2 (NEEDLE) ×1 IMPLANT
NDL HYPO 30X.5 LL (NEEDLE) ×2 IMPLANT
NEEDLE FILTER BLUNT 18X 1/2SAF (NEEDLE) ×2
NEEDLE FILTER BLUNT 18X1 1/2 (NEEDLE) ×1 IMPLANT
NEEDLE HYPO 30X.5 LL (NEEDLE) ×6 IMPLANT
PACK ENT CUSTOM (PACKS) ×3 IMPLANT
SOL PREP PVP 2OZ (MISCELLANEOUS) ×3
SOLUTION PREP PVP 2OZ (MISCELLANEOUS) ×1 IMPLANT
SPONGE GAUZE 2X2 8PLY STER LF (GAUZE/BANDAGES/DRESSINGS) ×1
SPONGE GAUZE 2X2 8PLY STRL LF (GAUZE/BANDAGES/DRESSINGS) ×1 IMPLANT
SPONGE VERSALON 2X2 STRL (MISCELLANEOUS) ×20
SUT CHROMIC 4-0 (SUTURE) ×2
SUT CHROMIC 4-0 M2 12X2 ARM (SUTURE) ×1
SUT CHROMIC 5 0 P 3 (SUTURE) IMPLANT
SUT ETHILON 4 0 CL P 3 (SUTURE) IMPLANT
SUT GUT PLAIN 6-0 1X18 ABS (SUTURE) ×2 IMPLANT
SUT MERSILENE 4-0 S-2 (SUTURE) ×3 IMPLANT
SUT PDS AB 4-0 P3 18 (SUTURE) IMPLANT
SUT PROLENE 5 0 P 3 (SUTURE) IMPLANT
SUT PROLENE 6 0 P 1 18 (SUTURE) IMPLANT
SUT SILK 4 0 G 3 (SUTURE) IMPLANT
SUT VIC AB 5-0 P-3 18X BRD (SUTURE) IMPLANT
SUT VIC AB 5-0 P3 18 (SUTURE)
SUT VICRYL 6-0  S14 CTD (SUTURE)
SUT VICRYL 6-0 S14 CTD (SUTURE) IMPLANT
SUT VICRYL 7 0 TG140 8 (SUTURE) IMPLANT
SUTURE CHRMC 4-0 M2 12X2 ARM (SUTURE) IMPLANT
SYR 10ML LL (SYRINGE) ×3 IMPLANT
SYR 3ML LL SCALE MARK (SYRINGE) ×3 IMPLANT
WATER STERILE IRR 250ML POUR (IV SOLUTION) ×3 IMPLANT

## 2020-02-20 NOTE — Transfer of Care (Signed)
Immediate Anesthesia Transfer of Care Note  Patient: [name]  Procedure(s) Performed: ECTROPION REPAIR, TARSAL WEDGE  ECTROPION REPAIR, EXTENSIVE (Left Eye)  Patient Location: PACU  Anesthesia Type: General  Level of Consciousness: awake, alert  and patient cooperative  Airway and Oxygen Therapy: Patient Spontanous Breathing and Patient connected to supplemental oxygen  Post-op Assessment: Post-op Vital signs reviewed, Patient's Cardiovascular Status Stable, Respiratory Function Stable, Patent Airway and No signs of Nausea or vomiting  Post-op Vital Signs: Reviewed and stable  Complications: No apparent anesthesia complications

## 2020-02-20 NOTE — H&P (Signed)
See the history and physical completed at [facility] on 02/12/20 and scanned into the chart.

## 2020-02-20 NOTE — Anesthesia Postprocedure Evaluation (Signed)
Anesthesia Post Note  Patient: [name]  Procedure(s) Performed: ECTROPION REPAIR, TARSAL WEDGE  ECTROPION REPAIR, EXTENSIVE (Left Eye)     Patient location during evaluation: PACU Anesthesia Type: General Level of consciousness: awake and alert Pain management: pain level controlled Vital Signs Assessment: post-procedure vital signs reviewed and stable Respiratory status: spontaneous breathing Cardiovascular status: stable Postop Assessment: no headache Anesthetic complications: no    Jaci Standard, III,  Arael Piccione D

## 2020-02-20 NOTE — Anesthesia Procedure Notes (Signed)
Procedure Name: MAC Date/Time: 02/20/2020 7:49 AM Performed by: Jeannene Patella, CRNA Pre-anesthesia Checklist: Patient identified, Emergency Drugs available, Suction available, Patient being monitored and Timeout performed Patient Re-evaluated:Patient Re-evaluated prior to induction Oxygen Delivery Method: Nasal cannula

## 2020-02-20 NOTE — Anesthesia Preprocedure Evaluation (Addendum)
Anesthesia Evaluation  Patient identified by MRN, date of birth, ID band Patient awake    Reviewed: Allergy & Precautions, H&P , NPO status , Patient's Chart, lab work & pertinent test results  History of Anesthesia Complications Negative for: history of anesthetic complications  Airway Mallampati: I  TM Distance: >3 FB Neck ROM: full    Dental no notable dental hx.    Pulmonary pneumonia,    Pulmonary exam normal breath sounds clear to auscultation       Cardiovascular Normal cardiovascular exam+ dysrhythmias Atrial Fibrillation + pacemaker  Rhythm:regular Rate:Normal     Neuro/Psych CVA    GI/Hepatic Neg liver ROS, Medicated,  Endo/Other  Hypothyroidism   Renal/GU      Musculoskeletal   Abdominal   Peds  Hematology  (+) Blood dyscrasia, anemia ,   Anesthesia Other Findings   Reproductive/Obstetrics                            Anesthesia Physical Anesthesia Plan  ASA: III  Anesthesia Plan: General   Post-op Pain Management:    Induction:   PONV Risk Score and Plan:   Airway Management Planned:   Additional Equipment:   Intra-op Plan:   Post-operative Plan:   Informed Consent: I have reviewed the patients History and Physical, chart, labs and discussed the procedure including the risks, benefits and alternatives for the proposed anesthesia with the patient or authorized representative who has indicated his/her understanding and acceptance.       Plan Discussed with:   Anesthesia Plan Comments:         Anesthesia Quick Evaluation

## 2020-02-20 NOTE — Interval H&P Note (Signed)
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  The various methods of treatment have been discussed with the patient and family. After consideration of risks, benefits and other options for treatment, the patient has consented to  Procedure(s): ECTROPION REPAIR, [place] (Left) as a surgical intervention.  The patient's history has been reviewed, patient examined, no change in status, stable for surgery.  I have reviewed the patient's chart and labs.  Questions were answered to the patient's satisfaction.     Vickki Muff, Lilyanne Mcquown M

## 2020-02-20 NOTE — Op Note (Signed)
Preoperative Diagnosis:   1.  Lower eyelid laxity with ectropion, left  lower eyelid(s). 2.  Punctal eversion left  lower eyelid(s)  Postoperative Diagnosis:   Same.  Procedure(s) Performed:  1.  Lateral tarsal strip procedure,  left   lower eyelid(s) 2.  Medial spindle (tarsal wedge excision) left  lower eyelid(s)  Surgeon: Philis Pique. Vickki Muff, M.D.  Assistants: none  Anesthesia: MAC  Specimens: None.  Estimated Blood Loss: Minimal.  Complications: None.  Operative Findings: left lower canaliculitis   Procedure:   Allergies were reviewed and the patient Chlorpheniramine, Fluconazole, Gabapentin, Metronidazole, Monistat  [tioconazole], and Sulfa antibiotics.    After discussing the risks, benefits, complications, and alternatives with the patient, appropriate informed consent was obtained. The patient was brought to the operating suite and reclined supine. Time out was conducted and the patient was sedated.  Local anesthetic consisting of a 50-50 mixture of 2% lidocaine with epinephrine and 0.75% bupivacaine with added Hylenex was injected subcutaneously to the left  lateral canthal region(s) and lower eyelid(s). Additional anesthetic was injected subconjunctivally to the left  lower eyelid(s). Finally, anesthetic was injected down to the periosteum of the left  lateral orbital rim(s).  After adequate local was instilled, the patient was prepped and draped in the usual sterile fashion for eyelid surgery. Attention was turned to the left  lateral canthal angle. Westcott scissors were used to create a lateral canthotomy. Hemostasis was obtained with bipolar cautery. An inferior cantholysis was then performed with additional bipolar hemostasis. The anterior and posterior lamella of the lid were divided for approximately 8 mm.  A strip of the epithelium was excised off the superior margin of the tarsal strip and conjunctiva and retractors were incised off the inferior margin of the tarsal  strip.  Attention was then turned to the medial portion of the lid. An ellipse of conjunctiva, retractors and inferior tarsal margin was excised inferior to the punctum.  Purulent material was coming out of the punctum, so the canaliculus was debrided with retrograde massage.  No dacryoliths were expressed.  Hemostasis was obtained with bipolar cautery. A double-armed 4-0 chromic suture was then passed through the tarsal edge first then the conjunctival and retractor borders and finally from posterior to anterior through the skin and tied off. This provided nice inward rotation of the lower eyelid medially   A double-armed 4-0 [place] suture was then passed each arm through the terminal portion of the tarsal strip. Each arm of the suture was then passed through the periosteum of the inner portion of the lateral orbital rim at the level of Whitnall's tubercle. The sutures were advanced and this provided nice elevation and tightening of the lower eyelid. Once the suture was secured, a thin strip of follicle-bearing skin was excised. The lateral canthal angle was reformed with an interrupted 6-0 plain gut suture. Orbicularis was reapproximated with horizontal subcuticular 6-0  plain gut sutures. The skin was closed with interrupted 6-0 plain gut sutures.   Attention was then turned to the opposite eyelid where the same procedure was performed in the same manner.   The patient tolerated the procedure well. Erythromycin ophthalmic ointment was applied to the incision site(s) followed by ice packs. The patient was taken to the recovery area where she recovered without difficulty.  Post-Op Plan/Instructions:  The patient was instructed to use ice packs frequently for the next 48 hours. She was instructed to use Erythromycin ophthalmic ointment on her incisions and ciloxan drops 4 times a day for the  next 12 to 14 days. She was given a prescription for tramadol (or similar) for pain control should Tylenol not  be effective. She was asked to to follow up at the [facility] in [name], [place] in 2-3 weeks' time or sooner as needed for problems.  Haidyn Kilburg M. Vickki Muff, M.D. Ophthalmology

## 2020-02-23 ENCOUNTER — Encounter: Payer: Self-pay | Admitting: *Deleted

## 2020-03-31 IMAGING — CR DG ANKLE COMPLETE 3+V*L*
1 series · 3 of 3 positions shown · non-contrast
Comparison: Radiographs November 19, 2015.

CLINICAL DATA: Left ankle wound.

EXAM:
LEFT ANKLE COMPLETE - 3+ VIEW

[Series 1: dg ankle complete left · 0.14mm/px · 3 of 3 slices shown]
[im 1/3]
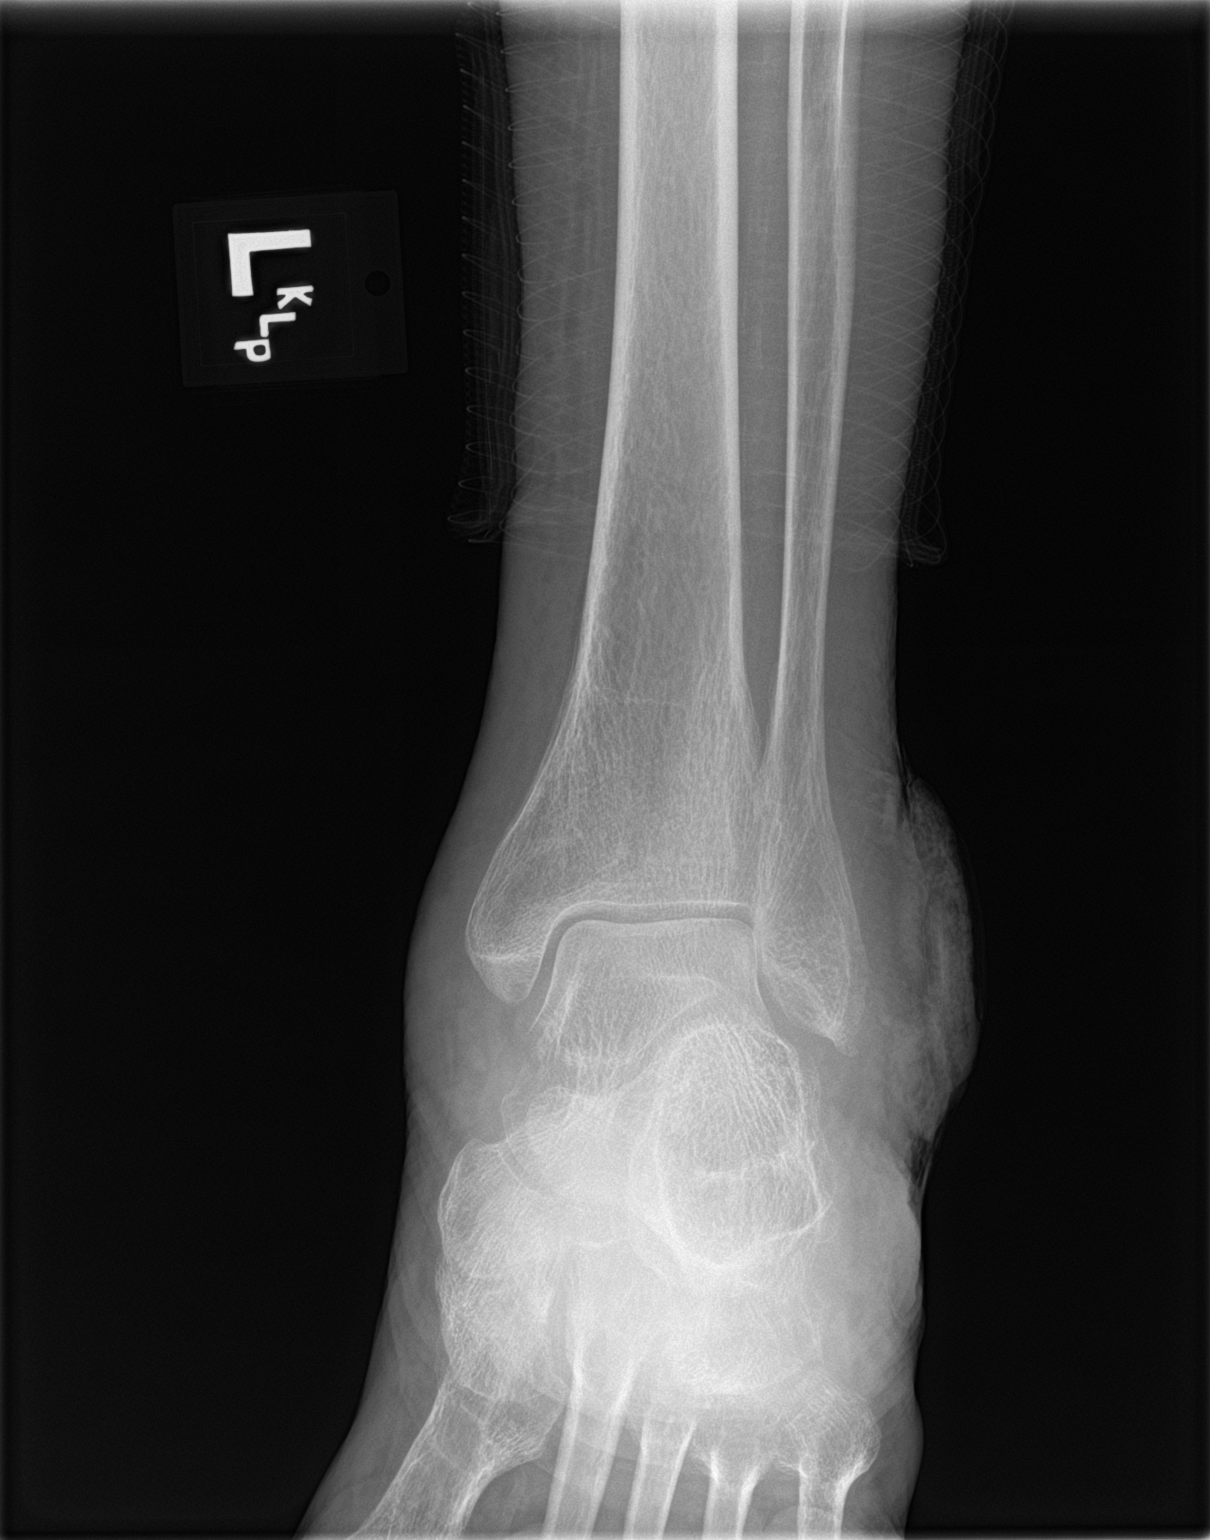
[im 2/3]
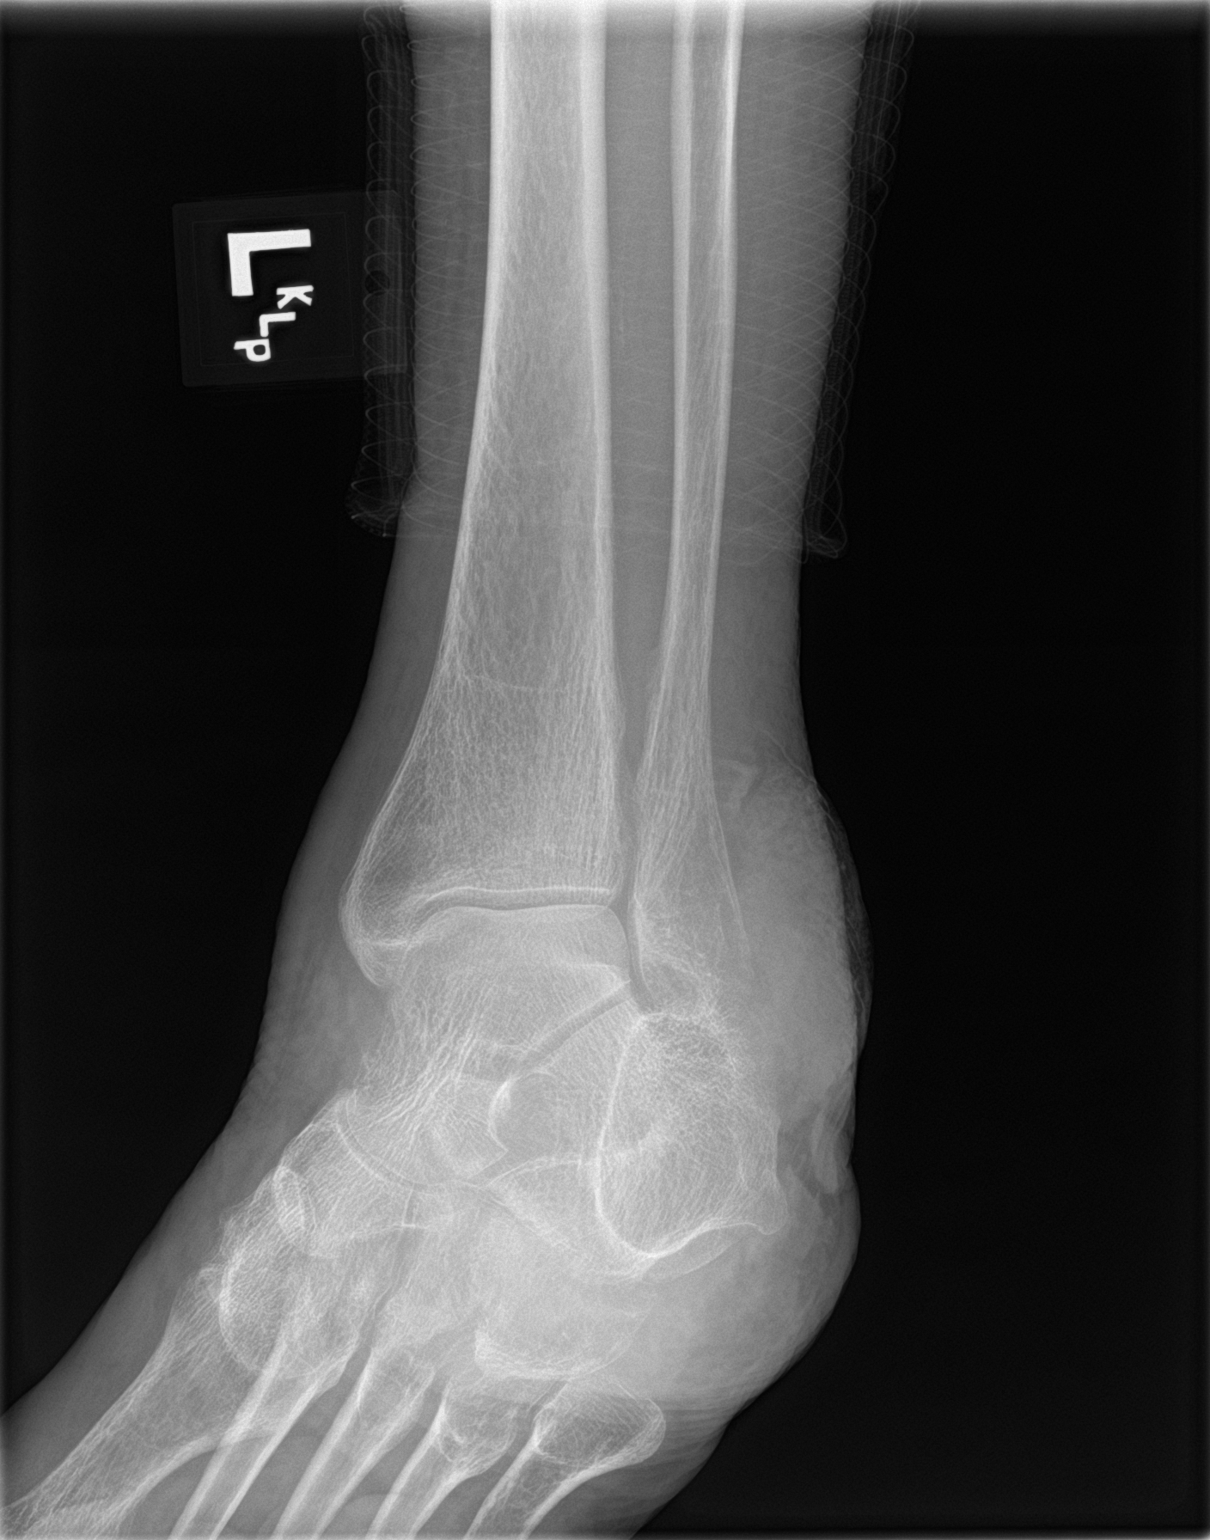
[im 3/3]
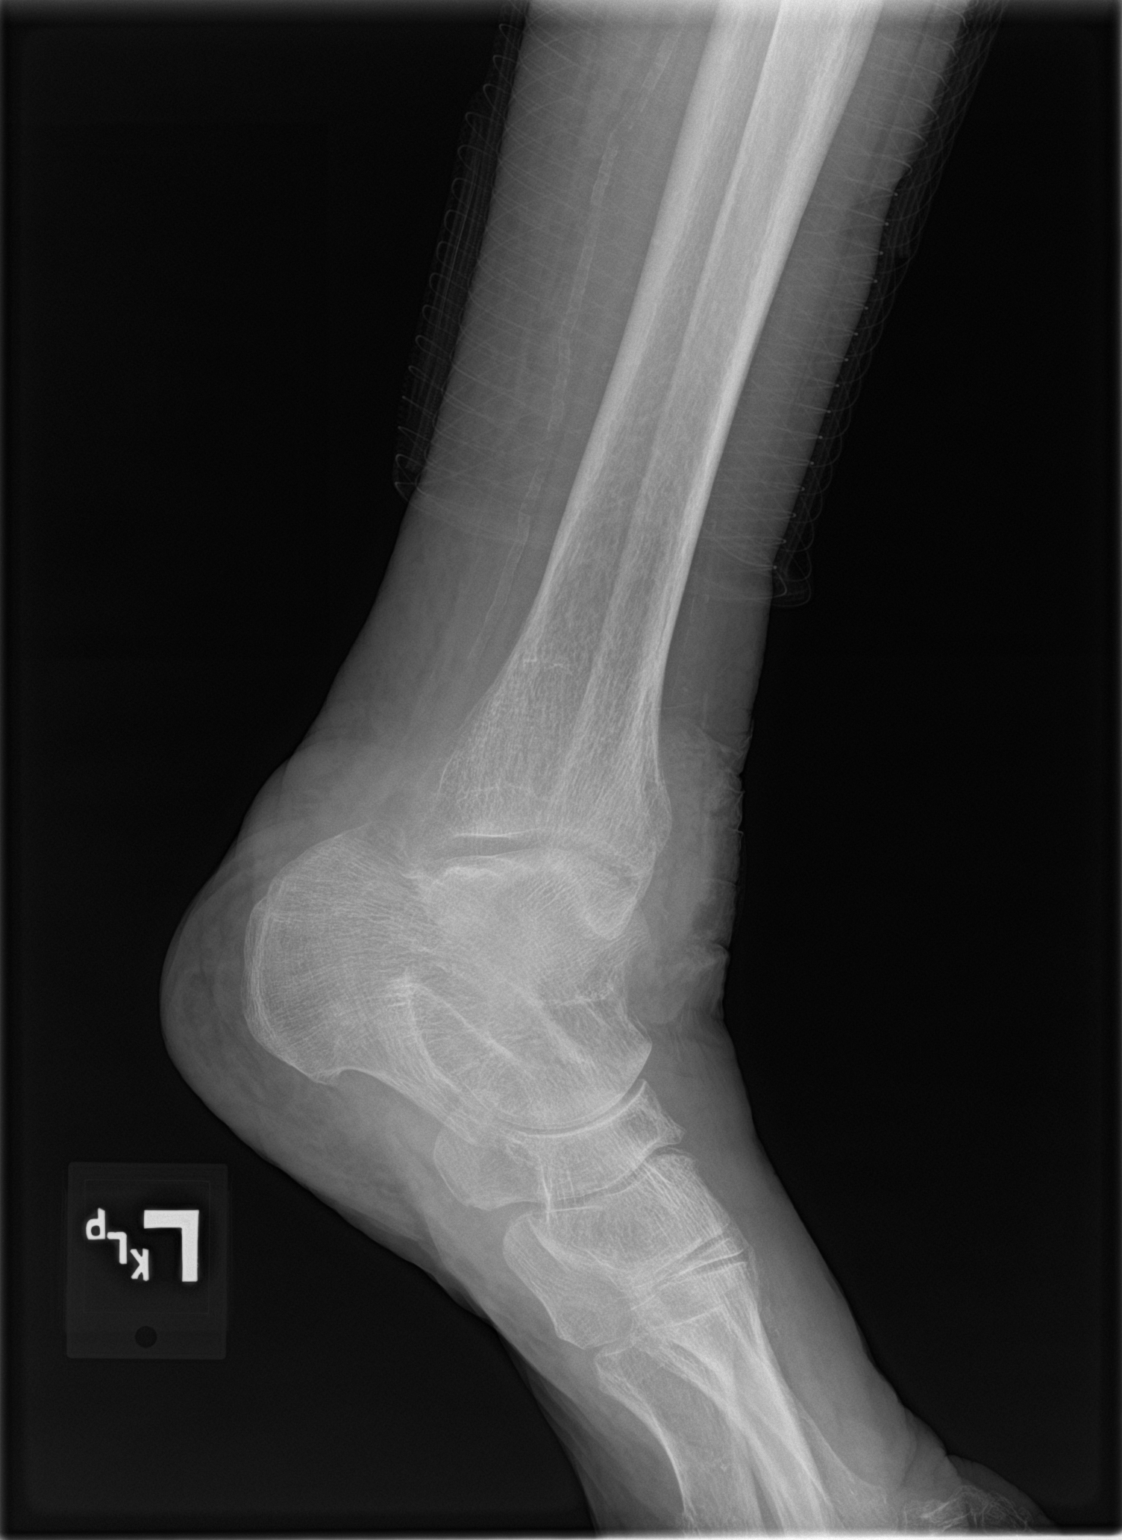

[3 of 3 positions shown; findings below may reference images not displayed]

FINDINGS: Large amount of soft tissue swelling is seen overlying lateral
malleolus. This is consistent with history of open wound in this
area. Slight lucency is seen involving the lateral portion of the
distal left fibula which may represent artifact, but lytic
destruction cannot be excluded. Joint spaces intact. No radiopaque
foreign body is noted. No definite fracture or dislocation is noted.
IMPRESSION: Large amount of soft tissue swelling is seen overlying lateral
malleolus consistent with history of wound in this area. Slight
lucency is seen involving lateral portion of distal left fibula
which may represent artifact, but underlying lytic destruction or
osteomyelitis cannot be excluded. Further evaluation with MRI is
recommended.

## 2020-04-23 IMAGING — DX DG ANKLE COMPLETE 3+V*L*
3 series · 3 of 3 positions shown · non-contrast
Comparison: 07/12/2018

CLINICAL DATA: Lateral ankle wound, soft tissue infection

EXAM:
LEFT ANKLE COMPLETE - 3+ VIEW

[ankle ap]
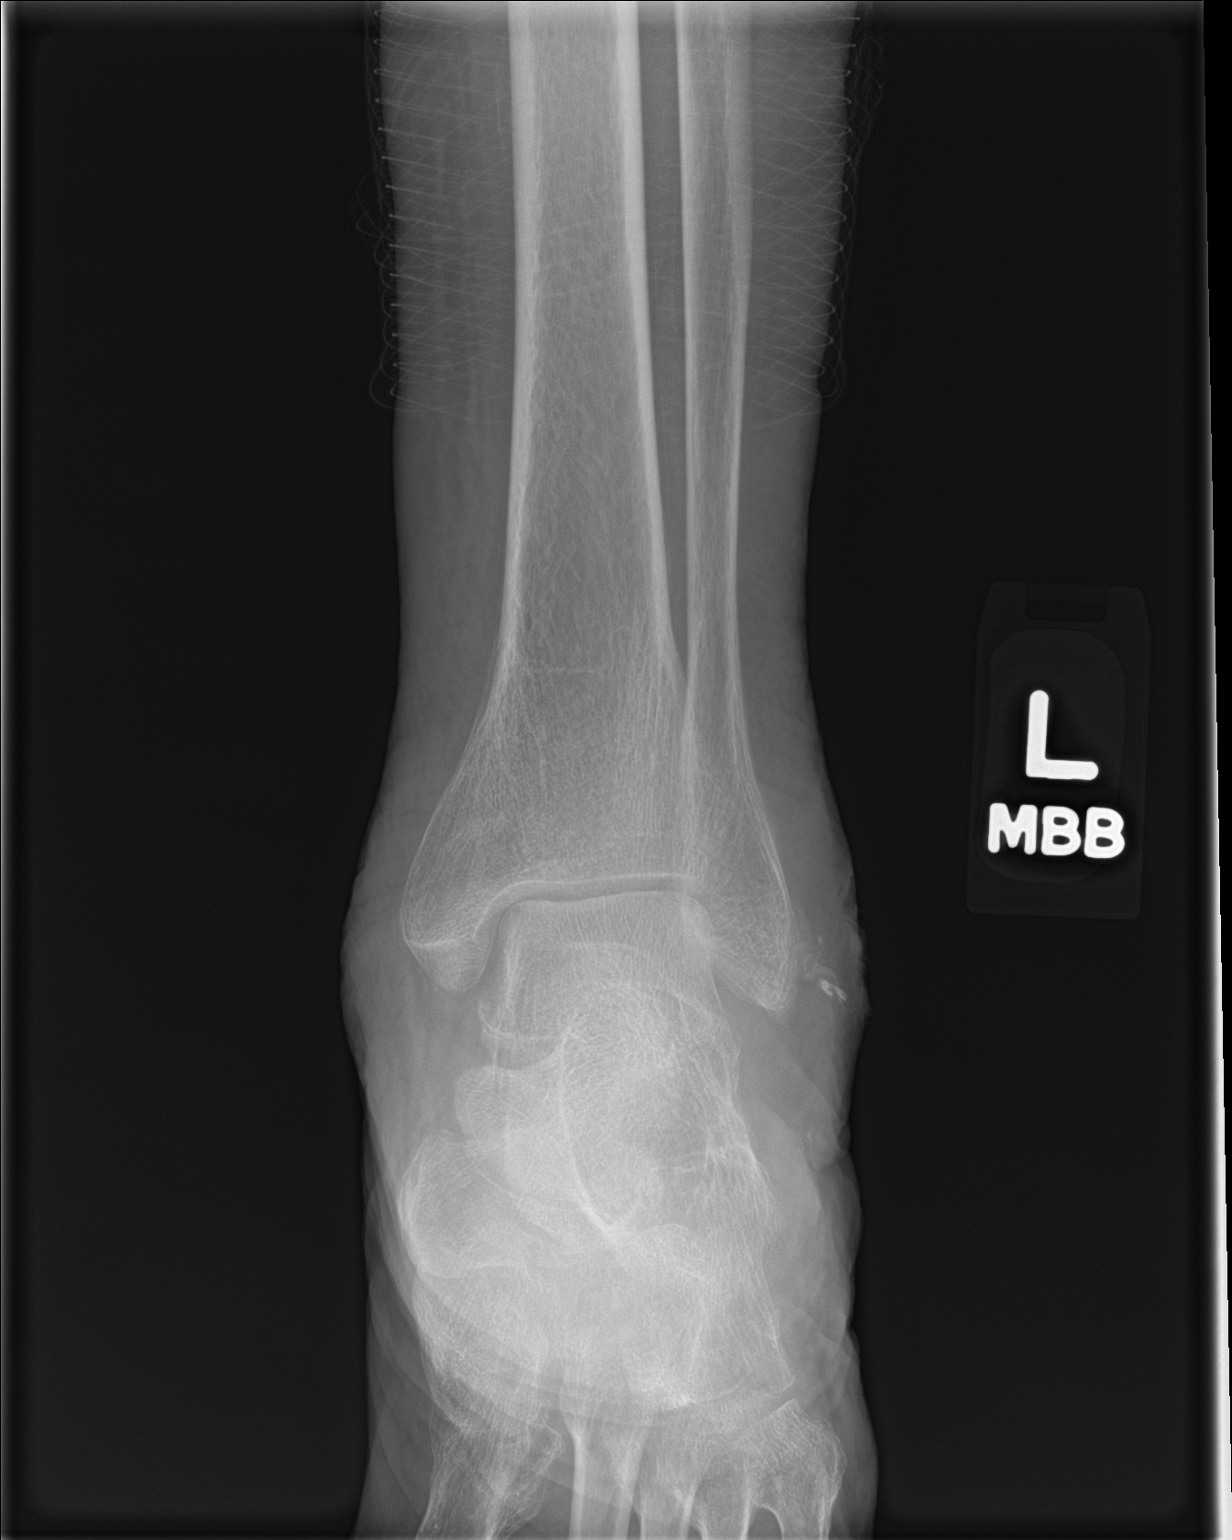

[ankle obl]
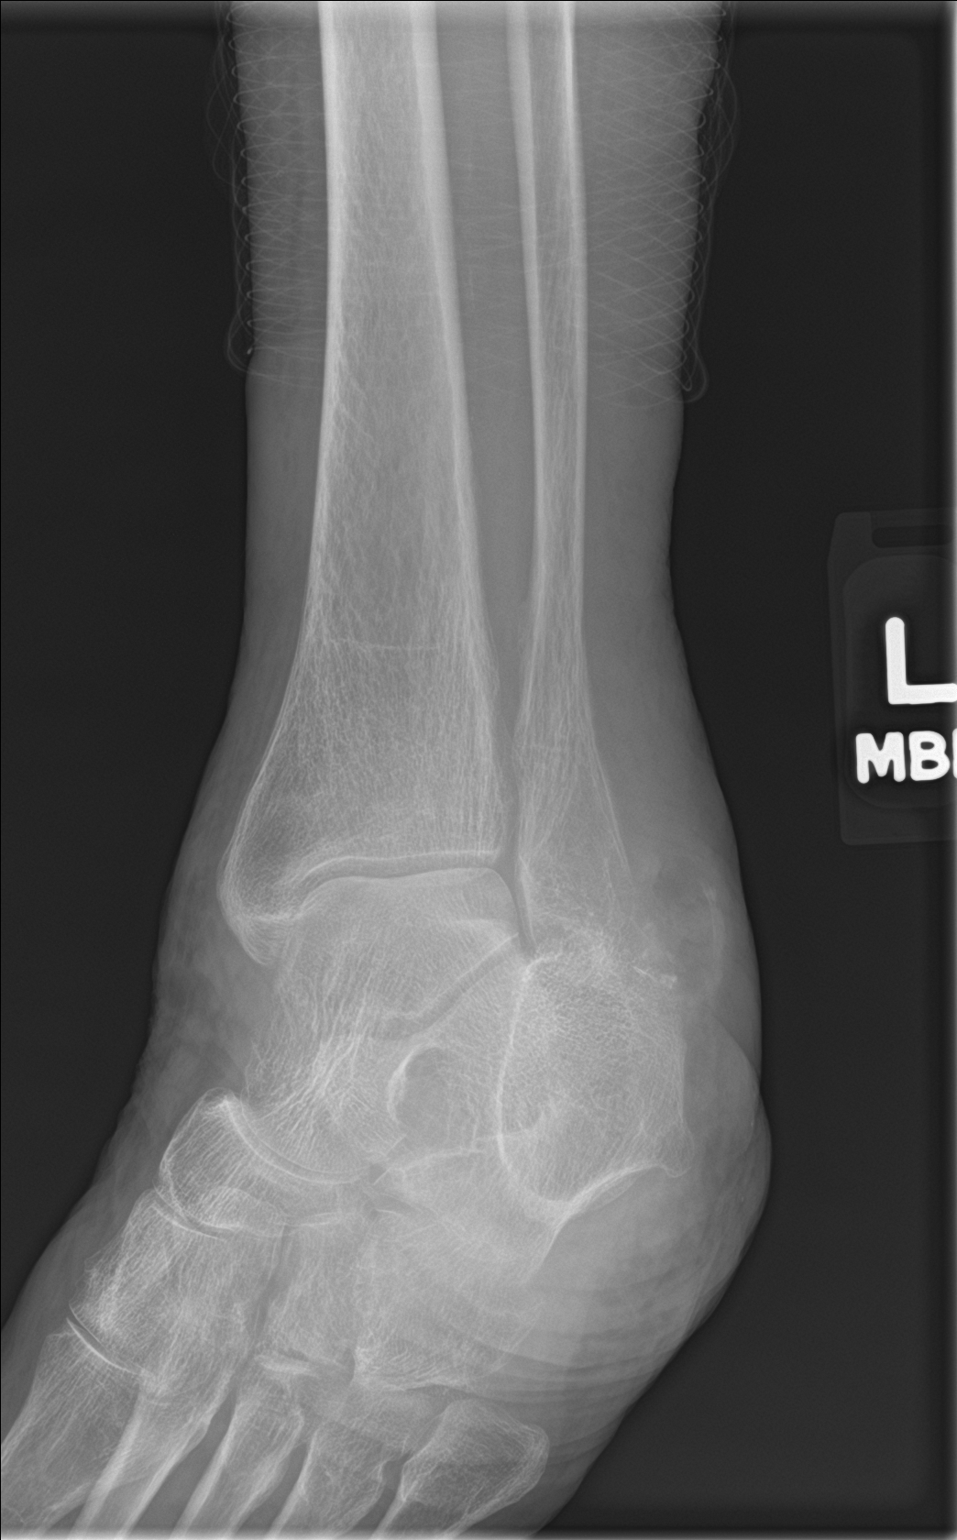

[ankle lat]
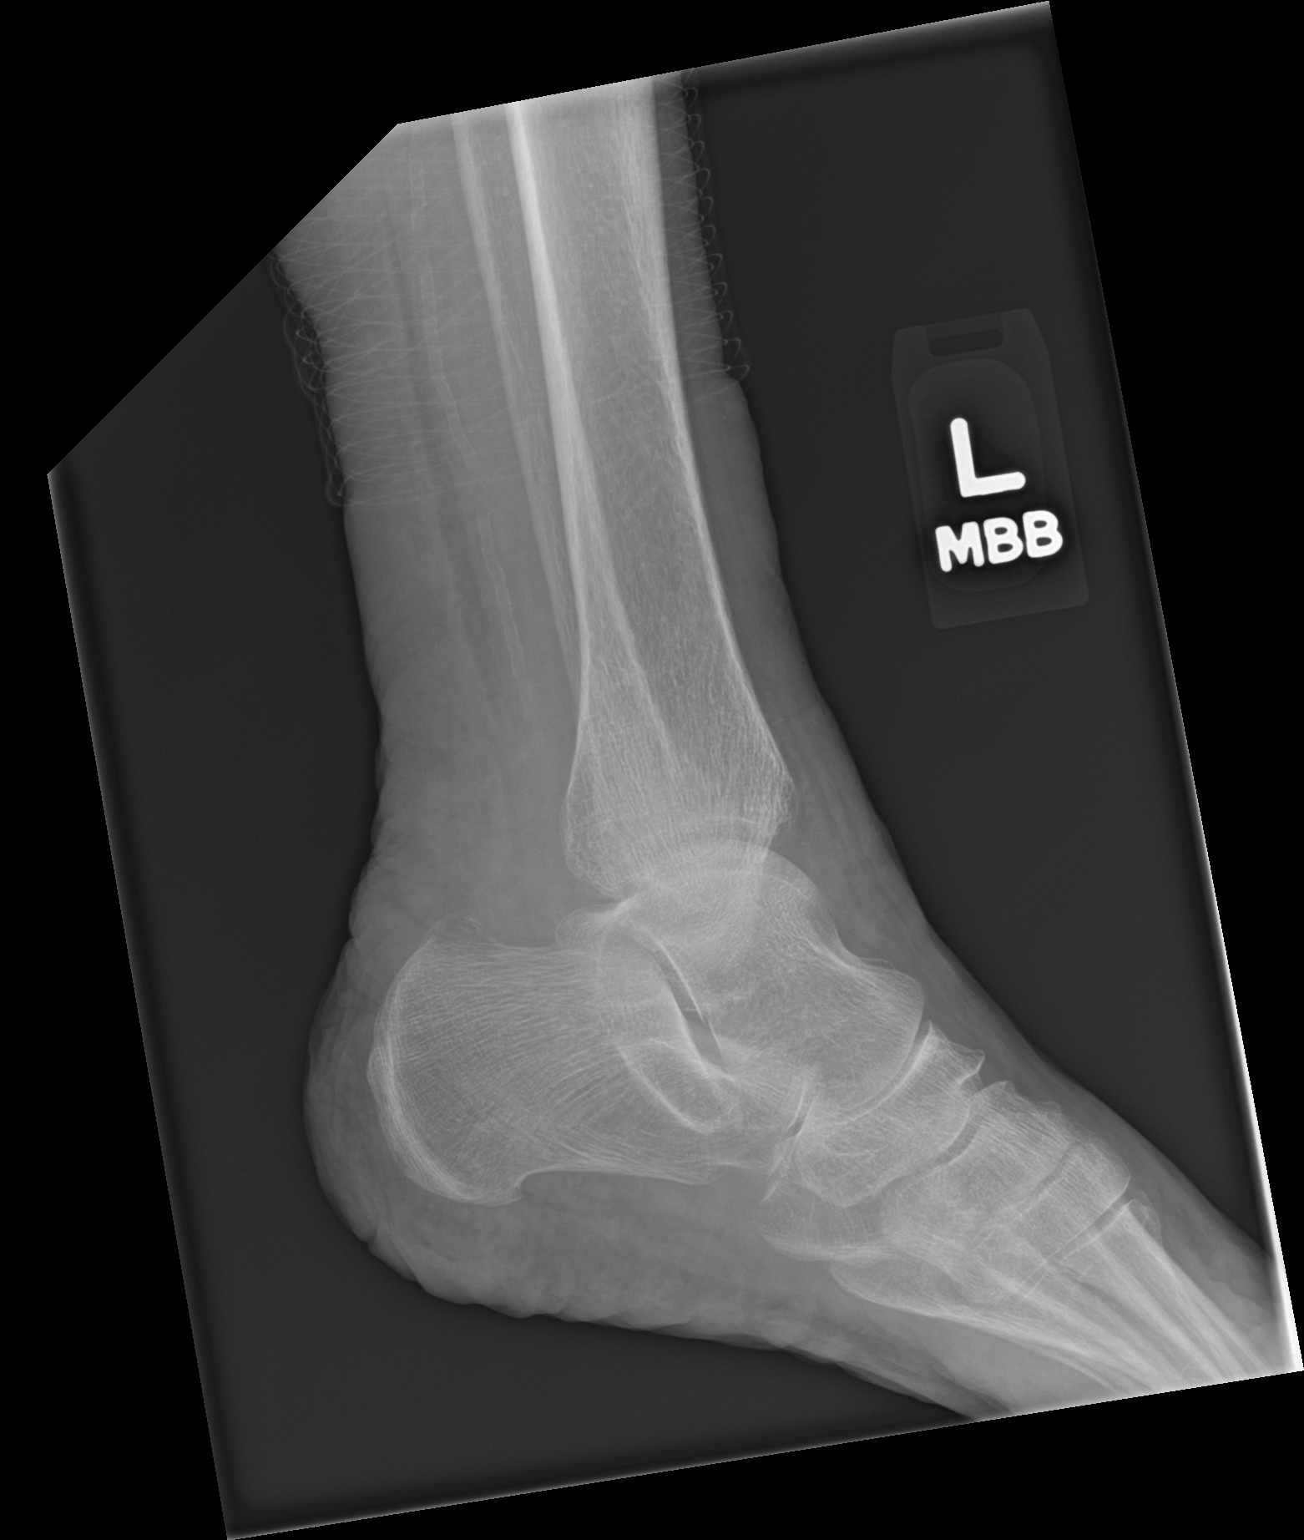

[3 of 3 positions shown; findings below may reference images not displayed]

FINDINGS: Interval improvement in the lateral ankle soft tissue swelling.
Small ossified fragments along the lateral malleolus may represent
small avulsion type fractures. Difficult to exclude underlying early
osteomyelitis in the setting of infection. Left distal tibia, talus
and calcaneus appear intact. Peripheral atherosclerosis noted.
IMPRESSION: Lateral malleolar small ossified fragments may represent small
avulsion fractures. Improvement in the soft tissue swelling. See
above comment.

Peripheral atherosclerosis

## 2020-04-29 ENCOUNTER — Ambulatory Visit: Payer: Medicare Other | Admitting: Podiatry

## 2020-05-20 ENCOUNTER — Other Ambulatory Visit: Payer: Self-pay

## 2020-05-20 DIAGNOSIS — E785 Hyperlipidemia, unspecified: Secondary | ICD-10-CM | POA: Insufficient documentation

## 2020-05-20 DIAGNOSIS — Z823 Family history of stroke: Secondary | ICD-10-CM | POA: Diagnosis not present

## 2020-05-20 DIAGNOSIS — I701 Atherosclerosis of renal artery: Secondary | ICD-10-CM | POA: Insufficient documentation

## 2020-05-20 DIAGNOSIS — Z20822 Contact with and (suspected) exposure to covid-19: Secondary | ICD-10-CM | POA: Diagnosis not present

## 2020-05-20 DIAGNOSIS — Z9049 Acquired absence of other specified parts of digestive tract: Secondary | ICD-10-CM | POA: Insufficient documentation

## 2020-05-20 DIAGNOSIS — Z79899 Other long term (current) drug therapy: Secondary | ICD-10-CM | POA: Insufficient documentation

## 2020-05-20 DIAGNOSIS — M86672 Other chronic osteomyelitis, left ankle and foot: Secondary | ICD-10-CM | POA: Insufficient documentation

## 2020-05-20 DIAGNOSIS — M810 Age-related osteoporosis without current pathological fracture: Secondary | ICD-10-CM | POA: Insufficient documentation

## 2020-05-20 DIAGNOSIS — K219 Gastro-esophageal reflux disease without esophagitis: Secondary | ICD-10-CM | POA: Diagnosis not present

## 2020-05-20 DIAGNOSIS — J984 Other disorders of lung: Secondary | ICD-10-CM | POA: Insufficient documentation

## 2020-05-20 DIAGNOSIS — Z780 Asymptomatic menopausal state: Secondary | ICD-10-CM | POA: Insufficient documentation

## 2020-05-20 DIAGNOSIS — Z888 Allergy status to other drugs, medicaments and biological substances status: Secondary | ICD-10-CM | POA: Insufficient documentation

## 2020-05-20 DIAGNOSIS — Z950 Presence of cardiac pacemaker: Secondary | ICD-10-CM | POA: Insufficient documentation

## 2020-05-20 DIAGNOSIS — K55069 Acute infarction of intestine, part and extent unspecified: Secondary | ICD-10-CM | POA: Diagnosis not present

## 2020-05-20 DIAGNOSIS — Z9861 Coronary angioplasty status: Secondary | ICD-10-CM | POA: Diagnosis not present

## 2020-05-20 DIAGNOSIS — G629 Polyneuropathy, unspecified: Secondary | ICD-10-CM | POA: Insufficient documentation

## 2020-05-20 DIAGNOSIS — Z881 Allergy status to other antibiotic agents status: Secondary | ICD-10-CM | POA: Insufficient documentation

## 2020-05-20 DIAGNOSIS — I739 Peripheral vascular disease, unspecified: Secondary | ICD-10-CM | POA: Insufficient documentation

## 2020-05-20 DIAGNOSIS — K625 Hemorrhage of anus and rectum: Secondary | ICD-10-CM | POA: Diagnosis not present

## 2020-05-20 DIAGNOSIS — Z9842 Cataract extraction status, left eye: Secondary | ICD-10-CM | POA: Insufficient documentation

## 2020-05-20 DIAGNOSIS — K449 Diaphragmatic hernia without obstruction or gangrene: Secondary | ICD-10-CM | POA: Insufficient documentation

## 2020-05-20 DIAGNOSIS — Z882 Allergy status to sulfonamides status: Secondary | ICD-10-CM | POA: Diagnosis not present

## 2020-05-20 DIAGNOSIS — Z791 Long term (current) use of non-steroidal anti-inflammatories (NSAID): Secondary | ICD-10-CM | POA: Diagnosis not present

## 2020-05-20 DIAGNOSIS — R32 Unspecified urinary incontinence: Secondary | ICD-10-CM | POA: Insufficient documentation

## 2020-05-20 DIAGNOSIS — Z8673 Personal history of transient ischemic attack (TIA), and cerebral infarction without residual deficits: Secondary | ICD-10-CM | POA: Insufficient documentation

## 2020-05-20 DIAGNOSIS — Z85828 Personal history of other malignant neoplasm of skin: Secondary | ICD-10-CM | POA: Insufficient documentation

## 2020-05-20 DIAGNOSIS — Z883 Allergy status to other anti-infective agents status: Secondary | ICD-10-CM | POA: Insufficient documentation

## 2020-05-20 DIAGNOSIS — G619 Inflammatory polyneuropathy, unspecified: Secondary | ICD-10-CM | POA: Insufficient documentation

## 2020-05-20 DIAGNOSIS — I495 Sick sinus syndrome: Secondary | ICD-10-CM | POA: Insufficient documentation

## 2020-05-20 DIAGNOSIS — Z7982 Long term (current) use of aspirin: Secondary | ICD-10-CM | POA: Insufficient documentation

## 2020-05-20 DIAGNOSIS — R42 Dizziness and giddiness: Secondary | ICD-10-CM | POA: Diagnosis not present

## 2020-05-20 DIAGNOSIS — E039 Hypothyroidism, unspecified: Secondary | ICD-10-CM | POA: Diagnosis not present

## 2020-05-20 DIAGNOSIS — K4090 Unilateral inguinal hernia, without obstruction or gangrene, not specified as recurrent: Secondary | ICD-10-CM | POA: Insufficient documentation

## 2020-05-20 DIAGNOSIS — I480 Paroxysmal atrial fibrillation: Secondary | ICD-10-CM | POA: Insufficient documentation

## 2020-05-20 DIAGNOSIS — Z809 Family history of malignant neoplasm, unspecified: Secondary | ICD-10-CM | POA: Diagnosis not present

## 2020-05-20 DIAGNOSIS — R1030 Lower abdominal pain, unspecified: Secondary | ICD-10-CM | POA: Diagnosis present

## 2020-05-20 DIAGNOSIS — K921 Melena: Secondary | ICD-10-CM | POA: Diagnosis present

## 2020-05-20 DIAGNOSIS — M5136 Other intervertebral disc degeneration, lumbar region: Secondary | ICD-10-CM | POA: Insufficient documentation

## 2020-05-20 DIAGNOSIS — I10 Essential (primary) hypertension: Secondary | ICD-10-CM | POA: Diagnosis not present

## 2020-05-20 DIAGNOSIS — Z7901 Long term (current) use of anticoagulants: Secondary | ICD-10-CM | POA: Insufficient documentation

## 2020-05-20 NOTE — ED Notes (Signed)
Pt had blood work done at [facility]. Results are under care everywhere.

## 2020-05-20 NOTE — ED Triage Notes (Addendum)
Pt in with co rectal bleeding states around 1300 was eating and had episode of loose stool with generalized abd pain. States since then has had 2 loose stools and when she wiped she noted blood on the toilet paper. States did not see blood in stool, no hx of hemorrhoids. Denies any pain at this time. States is having some rectal soreness.

## 2020-05-21 ENCOUNTER — Observation Stay
Admission: EM | Admit: 2020-05-21 | Discharge: 2020-05-22 | Disposition: A | Discharge status: Home / Self Care | Payer: Medicare Other | Attending: Internal Medicine | Admitting: Internal Medicine

## 2020-05-21 ENCOUNTER — Emergency Department: Payer: Medicare Other

## 2020-05-21 DIAGNOSIS — I480 Paroxysmal atrial fibrillation: Secondary | ICD-10-CM | POA: Diagnosis not present

## 2020-05-21 DIAGNOSIS — I10 Essential (primary) hypertension: Secondary | ICD-10-CM | POA: Diagnosis present

## 2020-05-21 DIAGNOSIS — E039 Hypothyroidism, unspecified: Secondary | ICD-10-CM

## 2020-05-21 DIAGNOSIS — K219 Gastro-esophageal reflux disease without esophagitis: Secondary | ICD-10-CM | POA: Diagnosis present

## 2020-05-21 DIAGNOSIS — R197 Diarrhea, unspecified: Secondary | ICD-10-CM

## 2020-05-21 DIAGNOSIS — K921 Melena: Secondary | ICD-10-CM | POA: Diagnosis not present

## 2020-05-21 DIAGNOSIS — K55069 Acute infarction of intestine, part and extent unspecified: Secondary | ICD-10-CM | POA: Diagnosis present

## 2020-05-21 DIAGNOSIS — I639 Cerebral infarction, unspecified: Secondary | ICD-10-CM | POA: Diagnosis present

## 2020-05-21 DIAGNOSIS — K625 Hemorrhage of anus and rectum: Secondary | ICD-10-CM | POA: Diagnosis present

## 2020-05-21 DIAGNOSIS — R1030 Lower abdominal pain, unspecified: Secondary | ICD-10-CM

## 2020-05-21 LAB — COMPREHENSIVE METABOLIC PANEL
ALT: 17 U/L (ref 0–44)
AST: 26 U/L (ref 15–41)
Albumin: 3.8 g/dL (ref 3.5–5.0)
Alkaline Phosphatase: 107 U/L (ref 38–126)
Anion gap: 10 (ref 5–15)
BUN: 28 mg/dL — ABNORMAL HIGH (ref 8–23)
CO2: 24 mmol/L (ref 22–32)
Calcium: 9 mg/dL (ref 8.9–10.3)
Chloride: 106 mmol/L (ref 98–111)
Creatinine, Ser: 0.97 mg/dL (ref 0.44–1.00)
GFR calc Af Amer: 58 mL/min — ABNORMAL LOW (ref >60)
GFR calc non Af Amer: 50 mL/min — ABNORMAL LOW (ref >60)
Glucose, Bld: 103 mg/dL — ABNORMAL HIGH (ref 70–99)
Potassium: 4.1 mmol/L (ref 3.5–5.1)
Sodium: 140 mmol/L (ref 135–145)
Total Bilirubin: 0.6 mg/dL (ref 0.3–1.2)
Total Protein: 8 g/dL (ref 6.5–8.1)

## 2020-05-21 LAB — CBC
HCT: 35.5 % — ABNORMAL LOW (ref 36.0–46.0)
HCT: 33.2 % — ABNORMAL LOW (ref 36.0–46.0)
HCT: 34.7 % — ABNORMAL LOW (ref 36.0–46.0)
Hemoglobin: 11.4 g/dL — ABNORMAL LOW (ref 12.0–15.0)
Hemoglobin: 10.8 g/dL — ABNORMAL LOW (ref 12.0–15.0)
Hemoglobin: 10.5 g/dL — ABNORMAL LOW (ref 12.0–15.0)
MCH: 26.1 pg (ref 26.0–34.0)
MCH: 26.3 pg (ref 26.0–34.0)
MCH: 25.7 pg — ABNORMAL LOW (ref 26.0–34.0)
MCHC: 32.1 g/dL (ref 30.0–36.0)
MCHC: 32.5 g/dL (ref 30.0–36.0)
MCHC: 30.3 g/dL (ref 30.0–36.0)
MCV: 81.2 fL (ref 80.0–100.0)
MCV: 81 fL (ref 80.0–100.0)
MCV: 84.8 fL (ref 80.0–100.0)
Platelets: 273 10*3/uL (ref 150–400)
Platelets: 272 10*3/uL (ref 150–400)
Platelets: 275 10*3/uL (ref 150–400)
RBC: 4.37 MIL/uL (ref 3.87–5.11)
RBC: 4.1 MIL/uL (ref 3.87–5.11)
RBC: 4.09 MIL/uL (ref 3.87–5.11)
RDW: 17.4 % — ABNORMAL HIGH (ref 11.5–15.5)
RDW: 17.6 % — ABNORMAL HIGH (ref 11.5–15.5)
RDW: 17.2 % — ABNORMAL HIGH (ref 11.5–15.5)
WBC: 7 10*3/uL (ref 4.0–10.5)
WBC: 7.6 10*3/uL (ref 4.0–10.5)
WBC: 8.1 10*3/uL (ref 4.0–10.5)
nRBC: 0 % (ref 0.0–0.2)
nRBC: 0 % (ref 0.0–0.2)
nRBC: 0 % (ref 0.0–0.2)

## 2020-05-21 LAB — TYPE AND SCREEN
ABO/RH(D): A POS
Antibody Screen: NEGATIVE

## 2020-05-21 LAB — CBC WITH DIFFERENTIAL/PLATELET
Abs Immature Granulocytes: 0.02 10*3/uL (ref 0.00–0.07)
Basophils Absolute: 0.1 10*3/uL (ref 0.0–0.1)
Basophils Relative: 1 %
Eosinophils Absolute: 0.3 10*3/uL (ref 0.0–0.5)
Eosinophils Relative: 4 %
HCT: 40.2 % (ref 36.0–46.0)
Hemoglobin: 12.3 g/dL (ref 12.0–15.0)
Immature Granulocytes: 0 %
Lymphocytes Relative: 26 %
Lymphs Abs: 2.1 10*3/uL (ref 0.7–4.0)
MCH: 25.8 pg — ABNORMAL LOW (ref 26.0–34.0)
MCHC: 30.6 g/dL (ref 30.0–36.0)
MCV: 84.5 fL (ref 80.0–100.0)
Monocytes Absolute: 1.2 10*3/uL — ABNORMAL HIGH (ref 0.1–1.0)
Monocytes Relative: 14 %
Neutro Abs: 4.6 10*3/uL (ref 1.7–7.7)
Neutrophils Relative %: 55 %
Platelets: 296 10*3/uL (ref 150–400)
RBC: 4.76 MIL/uL (ref 3.87–5.11)
RDW: 17.5 % — ABNORMAL HIGH (ref 11.5–15.5)
WBC: 8.3 10*3/uL (ref 4.0–10.5)
nRBC: 0 % (ref 0.0–0.2)

## 2020-05-21 LAB — BRAIN NATRIURETIC PEPTIDE: B Natriuretic Peptide: 139.3 pg/mL — ABNORMAL HIGH (ref 0.0–100.0)

## 2020-05-21 LAB — PROTIME-INR
INR: 1.2 (ref 0.8–1.2)
Prothrombin Time: 14.7 seconds (ref 11.4–15.2)

## 2020-05-21 LAB — LIPASE, BLOOD: Lipase: 39 U/L (ref 11–51)

## 2020-05-21 LAB — APTT: aPTT: 41 seconds — ABNORMAL HIGH (ref 24–36)

## 2020-05-21 LAB — SARS CORONAVIRUS 2 BY RT PCR (HOSPITAL ORDER, PERFORMED IN ~~LOC~~ HOSPITAL LAB): SARS Coronavirus 2: NEGATIVE

## 2020-05-21 MED ORDER — LEVOTHYROXINE SODIUM 50 MCG PO TABS: 25.0000 ug | ORAL_TABLET | ORAL | Status: DC

## 2020-05-21 MED ORDER — HYDRALAZINE HCL 20 MG/ML IJ SOLN: 5.0000 mg | INTRAMUSCULAR | Status: DC | PRN

## 2020-05-21 MED ORDER — IOHEXOL 350 MG/ML SOLN
75.0000 mL | Freq: Once | INTRAVENOUS | Status: AC | PRN
  Administered 2020-05-21: 75 mL via INTRAVENOUS

## 2020-05-21 MED ORDER — PANTOPRAZOLE SODIUM 40 MG IV SOLR
40.0000 mg | Freq: Two times a day (BID) | INTRAVENOUS | Status: DC
  Administered 2020-05-21 – 2020-05-22 (×3): 40 mg via INTRAVENOUS
  Filled 2020-05-21 (×3): qty 40

## 2020-05-21 MED ORDER — SODIUM CHLORIDE 0.9 % IV SOLN: INTRAVENOUS | Status: DC

## 2020-05-21 MED ORDER — SODIUM CHLORIDE 0.9 % IV BOLUS
500.0000 mL | Freq: Once | INTRAVENOUS | Status: AC
  Administered 2020-05-21: 500 mL via INTRAVENOUS

## 2020-05-21 MED ORDER — DILTIAZEM HCL ER BEADS 300 MG PO CP24
300.0000 mg | ORAL_CAPSULE | Freq: Every day | ORAL | Status: DC
  Administered 2020-05-21 – 2020-05-22 (×2): 300 mg via ORAL
  Filled 2020-05-21 (×2): qty 1

## 2020-05-21 MED ORDER — LOSARTAN POTASSIUM 50 MG PO TABS
50.0000 mg | ORAL_TABLET | Freq: Every day | ORAL | Status: DC
  Administered 2020-05-21 – 2020-05-22 (×2): 50 mg via ORAL
  Filled 2020-05-21 (×2): qty 1

## 2020-05-21 MED ORDER — ONDANSETRON HCL 4 MG/2ML IJ SOLN: 4.0000 mg | Freq: Three times a day (TID) | INTRAMUSCULAR | Status: DC | PRN

## 2020-05-21 MED ORDER — LEVOTHYROXINE SODIUM 50 MCG PO TABS
50.0000 ug | ORAL_TABLET | ORAL | Status: DC
  Administered 2020-05-22: 50 ug via ORAL
  Filled 2020-05-21: qty 1

## 2020-05-21 MED ORDER — POLYVINYL ALCOHOL 1.4 % OP SOLN
1.0000 [drp] | OPHTHALMIC | Status: DC | PRN
  Filled 2020-05-21: qty 15

## 2020-05-21 MED ORDER — METOPROLOL TARTRATE 50 MG PO TABS
50.0000 mg | ORAL_TABLET | Freq: Two times a day (BID) | ORAL | Status: DC
  Administered 2020-05-21 – 2020-05-22 (×3): 50 mg via ORAL
  Filled 2020-05-21 (×3): qty 1

## 2020-05-21 MED ORDER — ACETAMINOPHEN 325 MG PO TABS
650.0000 mg | ORAL_TABLET | Freq: Four times a day (QID) | ORAL | Status: DC | PRN
  Filled 2020-05-21: qty 2

## 2020-05-21 MED ORDER — OXYBUTYNIN CHLORIDE ER 10 MG PO TB24
10.0000 mg | ORAL_TABLET | Freq: Every day | ORAL | Status: DC
  Administered 2020-05-21: 10 mg via ORAL
  Filled 2020-05-21 (×2): qty 1

## 2020-05-21 NOTE — ED Provider Notes (Signed)
-----------------------------------------   7:08 AM on 05/21/2020 -----------------------------------------  Blood pressure (!) 158/75, pulse 73, temperature 98.4 F (36.9 C), temperature source Oral, resp. rate 18, height 5\' 5"  (1.651 m), weight 71.2 kg, SpO2 97 %.  Assuming care from Dr. Karma Greaser.  In short, [name] is a 86 y.o. female with a chief complaint of Rectal Bleeding .  Refer to the original H&P for additional details.  The current plan of care is to follow-up imaging results and plan for admission due to lower GI bleed.  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  There are adequate collaterals noted on CT and patient denies any abdominal pain at this time, so I would agree with radiology assessment that this is a chronic finding.  Patient has not had any more GI bleeding and remains hemodynamically stable.  Case was discussed with hospitalist for admission.    Blake Divine, MD 05/21/20 [phone]

## 2020-05-21 NOTE — Consult Note (Signed)
[facility] Vascular Consult Note  MRN : [id number]  [name] PLAUGHER is a 86 y.o. ([birth year]) female who presents with chief complaint of  Chief Complaint  Patient presents with  . Rectal Bleeding   History of Present Illness: Patient is a 86 year old female with multiple medical issues who presented to the [facility] emergency department with a chief complaint of "rectal bleeding".  Patient seen with her daughter at bedside.  Patient endorses a history of experiencing an episode of loose stool accompanied with generalized abdominal pain around 1:00pm yesterday.  Patient notes eating bright red blood on tissue paper.  Patient continued to have intermittent abdominal cramping that you episodes of bright red blood in her fecal matter.  Symptoms are improved today.  No recent bloody bowel movements.  Denies any fever, nausea vomiting.  Denies any shortness of breath or chest pain.  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  Vascular surgery was consulted by Dr. Blaine Hamper for rectal bleeding, SMA occlusion and right renal artery stenosis.  Current Facility-Administered Medications  Medication Dose Route Frequency Provider Last Rate Last Admin  . 0.9 %  sodium chloride infusion   Intravenous Continuous Ivor Costa, MD      . acetaminophen (TYLENOL) tablet 650 mg  650 mg Oral Q6H PRN Ivor Costa, MD      . hydrALAZINE (APRESOLINE) injection 5 mg  5 mg Intravenous Q2H PRN Ivor Costa, MD      . ondansetron [facility]) injection 4 mg  4 mg Intravenous Q8H PRN Ivor Costa, MD      . pantoprazole (PROTONIX) injection 40 mg  40 mg Intravenous Q12H Ivor Costa, MD       Current Outpatient Medications  Medication Sig Dispense Refill  . dabigatran (PRADAXA) 150 MG CAPS capsule Take 150 mg by mouth 2 (two) times daily.    Marland Kitchen diltiazem (TIAZAC) 300 MG 24 hr capsule Take 300 mg by  mouth daily.    . furosemide (LASIX) 20 MG tablet Take 20 mg by mouth daily.     Marland Kitchen levothyroxine (SYNTHROID) 25 MCG tablet Take 25 mcg by mouth daily before breakfast. Administer on  Tuesday and thursdays only    . levothyroxine (SYNTHROID, LEVOTHROID) 50 MCG tablet Take 50 mcg by mouth daily before breakfast. Everyday except Tuesday and thursday    . losartan (COZAAR) 50 MG tablet Take 50 mg by mouth daily.     . metoprolol (LOPRESSOR) 50 MG tablet Take 50 mg by mouth 2 (two) times daily.    Marland Kitchen omeprazole (PRILOSEC) 40 MG capsule Take 40 mg by mouth daily.     . potassium chloride SA (K-DUR,KLOR-CON) 20 MEQ tablet Take 20 mEq by mouth daily.    Marland Kitchen acetaminophen (TYLENOL) 650 MG CR tablet Take 650 mg by mouth every 8 (eight) hours as needed for pain.    Marland Kitchen aspirin EC 81 MG tablet Take 81 mg by mouth daily. (Patient not taking: Reported on 05/21/2020)    . Biotin 1000 MCG tablet Take 1,000 mcg by mouth daily. (Patient not taking: Reported on 05/21/2020)    . calcium carbonate (OSCAL) 1500 (600 Ca) MG TABS tablet Take 600 mg of elemental calcium by mouth daily. (Patient not taking: Reported on 05/21/2020)    . Carboxymethylcellulose Sodium (ARTIFICIAL TEARS OP) Apply to eye as needed.    Marland Kitchen CARTIA XT 300 MG 24 hr capsule Take 300  mg by mouth at bedtime.  (Patient not taking: Reported on 05/21/2020)    . Cholecalciferol (VITAMIN D) 2000 units CAPS Take 2,000 Units by mouth daily. (Patient not taking: Reported on 05/21/2020)    . cyanocobalamin 1000 MCG tablet Take 1,000 mcg by mouth daily. (Patient not taking: Reported on 05/21/2020)    . erythromycin ophthalmic ointment Apply to sutures 4 times a day for 10-12 days.  Discontinue if allergy develops and call our office (Patient not taking: Reported on 05/21/2020) 3.5 g 2  . Glucosamine 500 MG CAPS Take by mouth daily. (Patient not taking: Reported on 05/21/2020)    . Magnesium 250 MG TABS Take by mouth daily. (Patient not taking: Reported on 05/21/2020)    .  meloxicam (MOBIC) 7.5 MG tablet  (Patient not taking: Reported on 05/21/2020)    . Multiple Vitamin (MULTIVITAMIN) tablet Take 1 tablet by mouth daily. (Patient not taking: Reported on 05/21/2020)    . Multiple Vitamins-Minerals (PRESERVISION AREDS 2+MULTI VIT PO) Take by mouth. (Patient not taking: Reported on 05/21/2020)    . Multiple Vitamins-Minerals (PRESERVISION/LUTEIN PO) Take 1 capsule by mouth daily. (Patient not taking: Reported on 05/21/2020)    . mupirocin ointment (BACTROBAN) 2 % Place 1 application into the nose as needed.    . neomycin-polymyxin-dexameth (MAXITROL) 0.1 % OINT Place 1 application into the left eye 2 (two) times daily. (Patient not taking: Reported on 05/21/2020)    . NONFORMULARY OR COMPOUNDED ITEM See pharmacy note 120 each 2  . nystatin-triamcinolone (MYCOLOG II) cream Apply 1 application topically 2 (two) times daily as needed.    . Red Yeast Rice Extract (RED YEAST RICE PO) Take by mouth daily. (Patient not taking: Reported on 05/21/2020)    . tolterodine (DETROL) 2 MG tablet Take 2 mg by mouth daily as needed (bladder control).     . traMADol (ULTRAM) 50 MG tablet Take 1 every 4-6 hours as needed for pain not controlled by Tylenol (Patient not taking: Reported on 05/21/2020) 6 tablet 0  . vitamin A 10000 UNIT capsule Take by mouth. (Patient not taking: Reported on 05/21/2020)    . vitamin C (ASCORBIC ACID) 500 MG tablet Take 1,000 mg by mouth daily. (Patient not taking: Reported on 05/21/2020)    . zinc sulfate 220 (50 Zn) MG capsule Take by mouth. (Patient not taking: Reported on 05/21/2020)     Past Medical History:  Diagnosis Date  . Anemia   . Atrial fibrillation ([place])   . Dysrhythmia 2010   has pacemaker  . GERD (gastroesophageal reflux disease)   . HOH (hard of hearing)    can not hear at all in right ear. does not wear hearing aides  . Hypothyroidism   . Neuropathy    feet  . Peripheral vascular disease ([place])   . Pneumonia 2010   had collapsed lung  .  Presence of permanent cardiac pacemaker    2010  . Skin cancer, basal cell    skin cancer   . Stroke ([place]) 2000   mini, per daughter. had blood clot in right ear  . Varicose veins   . Vertigo    Past Surgical History:  Procedure Laterality Date  . ANGIOPLASTY Right 2015   unsure if stent was placed. dr dew performed procedure  . APPENDECTOMY    . BASAL CELL CARCINOMA EXCISION Left   . BREAST CYST EXCISION Left   . CATARACT EXTRACTION Left   . CHOLECYSTECTOMY    . ECTROPION REPAIR Left 02/20/2020  Procedure: ECTROPION REPAIR, TARSAL WEDGE  ECTROPION REPAIR, EXTENSIVE;  Surgeon: Karle Starch, MD;  Location: [facility];  Service: Ophthalmology;  Laterality: Left;  . EYE SURGERY Bilateral 2011, 2014   cataract surgery  . LOWER EXTREMITY ANGIOGRAPHY Left 11/11/2018   Procedure: LOWER EXTREMITY ANGIOGRAPHY;  Surgeon: Algernon Huxley, MD;  Location: [facility];  Service: Cardiovascular;  Laterality: Left;  . PACEMAKER PLACEMENT    . PERIPHERAL VASCULAR CATHETERIZATION Right 10/09/2016   Procedure: Lower Extremity Angiography;  Surgeon: Algernon Huxley, MD;  Location: [place];  Service: Cardiovascular;  Laterality: Right;  . PERIPHERAL VASCULAR CATHETERIZATION  10/09/2016   Procedure: Lower Extremity Intervention;  Surgeon: Algernon Huxley, MD;  Location: [facility];  Service: Cardiovascular;;  . TONSILLECTOMY     Social History Social History   Tobacco Use  . Smoking status: Never Smoker  . Smokeless tobacco: Never Used  Vaping Use  . Vaping Use: Never used  Substance Use Topics  . Alcohol use: No  . Drug use: No   Family History Family History  Problem Relation Age of Onset  . Stroke Mother   . Cancer Father   Denies family history of peripheral artery disease, mesenteric ischemia or renal disease.  Allergies  Allergen Reactions  . Chlorpheniramine     Other reaction(s): Unknown Unsure who ordered this med or what it was for. `  .  Fluconazole Rash  . Gabapentin Rash  . Metronidazole Nausea And Vomiting, Other (See Comments) and Rash  . Monistat  [Tioconazole] Rash and Other (See Comments)  . Sulfa Antibiotics Rash and Nausea And Vomiting   REVIEW OF SYSTEMS (Negative unless checked)  Constitutional: [] Weight loss  [] Fever  [] Chills Cardiac: [] Chest pain   [] Chest pressure   [] Palpitations   [] Shortness of breath when laying flat   [] Shortness of breath at rest   [] Shortness of breath with exertion. Vascular:  [] Pain in legs with walking   [] Pain in legs at rest   [] Pain in legs when laying flat   [] Claudication   [] Pain in feet when walking  [] Pain in feet at rest  [] Pain in feet when laying flat   [] History of DVT   [] Phlebitis   [] Swelling in legs   [] Varicose veins   [] Non-healing ulcers Pulmonary:   [] Uses home oxygen   [] Productive cough   [] Hemoptysis   [] Wheeze  [] COPD   [] Asthma Neurologic:  [] Dizziness  [] Blackouts   [] Seizures   [] History of stroke   [] History of TIA  [] Aphasia   [] Temporary blindness   [] Dysphagia   [] Weakness or numbness in arms   [] Weakness or numbness in legs Musculoskeletal:  [] Arthritis   [] Joint swelling   [] Joint pain   [] Low back pain Hematologic:  [] Easy bruising  [] Easy bleeding   [] Hypercoagulable state   [] Anemic  [] Hepatitis Gastrointestinal:  [] Blood in stool   [] Vomiting blood  [] Gastroesophageal reflux/heartburn   [] Difficulty swallowing. Genitourinary:  [] Chronic kidney disease   [] Difficult urination  [] Frequent urination  [] Burning with urination   [] Blood in urine Skin:  [] Rashes   [] Ulcers   [] Wounds Psychological:  [] History of anxiety   []  History of major depression.  Positive for abdominal pain and rectal bleeding.  Physical Examination  Vitals:   05/21/20 0934 05/21/20 0935 05/21/20 0936 05/21/20 0937  BP:      Pulse: 89 90 97 (!) 104  Resp:      Temp:      TempSrc:  SpO2: 96% 95% 91% 97%  Weight:      Height:       Body mass index is 26.13  kg/m. Gen:  WD/WN, NAD Head: [name]/AT, No temporalis wasting. Prominent temp pulse not noted. Ear/Nose/Throat: Hearing grossly intact, nares w/o erythema or drainage, oropharynx w/o Erythema/Exudate Eyes: Sclera non-icteric, conjunctiva clear Neck: Trachea midline.  No JVD.  Pulmonary:  Good air movement, respirations not labored, equal bilaterally.  Cardiac: RRR, normal S1, S2. Vascular:  Vessel Right Left  Radial Palpable Palpable  Ulnar Palpable Palpable  Brachial Palpable Palpable  Carotid Palpable, without bruit Palpable, without bruit  Aorta Not palpable N/A  Femoral Palpable Palpable  Popliteal Palpable Palpable  PT Palpable Palpable  DP Palpable Palpable   Gastrointestinal: soft, non-tender/non-distended. No guarding/reflex.  Musculoskeletal: M/S 5/5 throughout.  Extremities without ischemic changes.  No deformity or atrophy. No edema. Neurologic: Sensation grossly intact in extremities.  Symmetrical.  Speech is fluent. Motor exam as listed above. Psychiatric: Judgment intact, Mood & affect appropriate for pt's clinical situation. Dermatologic: No rashes or ulcers noted.  No cellulitis or open wounds. Lymph : No Cervical, Axillary, or Inguinal lymphadenopathy.  CBC Lab Results  Component Value Date   WBC 8.3 05/21/2020   HGB 12.3 05/21/2020   HCT 40.2 05/21/2020   MCV 84.5 05/21/2020   PLT 296 05/21/2020    BMET    Component Value Date/Time   NA 140 05/21/2020 0631   NA 137 12/25/2012 0941   K 4.1 05/21/2020 0631   K 3.7 12/25/2012 0941   CL 106 05/21/2020 0631   CL 104 12/25/2012 0941   CO2 24 05/21/2020 0631   CO2 23 12/25/2012 0941   GLUCOSE 103 (H) 05/21/2020 0631   GLUCOSE 186 (H) 12/25/2012 0941   BUN 28 (H) 05/21/2020 0631   BUN 25 (H) 12/25/2012 0941   CREATININE 0.97 05/21/2020 0631   CREATININE 1.03 (H) 09/25/2018 1055   CALCIUM 9.0 05/21/2020 0631   CALCIUM 8.9 12/25/2012 0941   GFRNONAA 50 (L) 05/21/2020 0631   GFRNONAA 34 (L) 09/02/2018 1503    GFRAA 58 (L) 05/21/2020 0631   GFRAA 39 (L) 09/02/2018 1503   Estimated Creatinine Clearance: 35.1 mL/min (by C-G formula based on SCr of 0.97 mg/dL).  COAG Lab Results  Component Value Date   INR 1.2 05/21/2020   INR 1.0 08/30/2012   INR 0.8 08/12/2012    Radiology CT Angio Abd/Pel W and/or Wo Contrast  Result Date: 05/21/2020 CLINICAL DATA:  Rectal bleeding, melena, abdominal pain, diarrhea and fecal incontinence. EXAM: CT ANGIOGRAPHY ABDOMEN AND PELVIS WITH CONTRAST AND WITHOUT CONTRAST TECHNIQUE: Multidetector CT imaging of the abdomen and pelvis was performed using the standard protocol during bolus administration of intravenous contrast. Multiplanar reconstructed images and MIPs were obtained and reviewed to evaluate the vascular anatomy. CONTRAST:  72mL OMNIPAQUE IOHEXOL 350 MG/ML SOLN COMPARISON:  Prior CT of the abdomen and pelvis without contrast on 03/22/2012 and CT of the chest without contrast on 07/13/2016 FINDINGS: VASCULAR Aorta: Tortuous aorta containing calcified plaque without evidence of aneurysm or dissection. Celiac: Calcified plaque at the origin of the celiac axis without significant stenosis. There appear to be well developed collaterals via the pancreaticoduodenal arcade supplied by the gastroduodenal artery and reconstituting the SMA trunk and SMA branches. SMA: The proximal trunk of the SMA is likely completely occluded or essentially completely occluded by a large amount of heavily calcified plaque beginning at the origin and continuing for approximately a 2.5 cm segment into  the proximal trunk. The trunk is atretic distal to the calcified plaque supporting complete occlusion and suddenly becomes reconstituted by visible collateral supply from the GDA and pancreaticoduodenal arcade. Reconstituted distal branches appear patent. Renals: Single renal arteries bilaterally. Calcified and noncalcified plaque in the proximal right renal artery causes likely significant  stenosis of 70% or greater. Calcified plaque at the origin of a left renal artery that demonstrates almost immediate bifurcation. No evidence of significant left renal artery stenosis. IMA: Patent and enlarged IMA with visible collateral supply to SMA branches. This finding also supports likely chronic proximal SMA occlusion. Inflow: Calcified atherosclerosis without evidence of aneurysmal disease or significant obstructive disease of the iliac arteries bilaterally. Proximal Outflow: Common femoral arteries show scattered plaque bilaterally without significant stenosis. Femoral bifurcations are patent bilaterally. Veins: Venous phase imaging demonstrates no evidence of varices or venous occlusions. The portal vein, visualized mesenteric veins and splenic vein are normally patent. Review of the MIP images confirms the above findings. NON-VASCULAR Lower chest: Small to moderate-sized hiatal hernia. Bibasilar pulmonary scarring. Hepatobiliary: No focal liver abnormality is seen. Status post cholecystectomy. Mild biliary dilatation of extrahepatic bile ducts is likely due to prior cholecystectomy. The common hepatic and common bile ducts measure up to 9-11 mm. No obvious calculi in the bile ducts. Pancreas: Unremarkable. No pancreatic ductal dilatation or surrounding inflammatory changes. Spleen: Normal spleen size. Stable low-density lesion in the inferior spleen is consistent with cyst or hemangioma and measures approximately 1.3 cm. Adrenals/Urinary Tract: Adrenal glands are unremarkable. Kidneys are normal, without renal calculi, focal lesion, or hydronephrosis. Bladder is unremarkable. Stomach/Bowel: Bowel shows no evidence of obstruction, ileus, inflammation or visible lesion without oral contrast. No free air. Unenhanced, arterial phase and venous phase imaging demonstrates no evidence of active bleeding or contrast extravasation into the gastrointestinal tract. Lymphatic: No enlarged abdominal or pelvic lymph  nodes. Reproductive: Small postmenopausal uterus. No adnexal masses identified. Other: No ascites or focal fluid collections identified. Small right inguinal hernia contains a short segment of distal small bowel without evidence of incarceration. Small left inguinal hernia contains fat. Musculoskeletal: Degenerative disc disease with associated degenerative scoliosis of the thoracolumbar and lumbar spines. No evidence of fracture or bony lesion. IMPRESSION: 1. Likely chronic occlusion or near complete occlusion of the proximal trunk of the SMA by a large amount of heavily calcified plaque beginning at the origin and continuing for approximately a 2.5 cm segment into the proximal trunk. The trunk is atretic distal to the calcified plaque supporting complete occlusion and suddenly becomes reconstituted by visible prominent collateral supply from the GDA and pancreaticoduodenal arcade. This also supports likely chronic proximal SMA occlusion. 2. The IMA is also enlarged with visible collateral supply to SMA branches supporting chronic SMA occlusion. 3. No visible acute bowel pathology or active bleeding into the gastrointestinal tract by CTA. 4. Probable significant stenosis of the proximal right renal artery of 70% or greater. 5. Small to moderate-sized hiatal hernia. 6. Small right inguinal hernia containing a short segment of distal small bowel without evidence of incarceration. Small left inguinal hernia containing fat. 7. Mild biliary dilatation is likely due to prior cholecystectomy. The common hepatic and common bile ducts measure up to 9-11 mm. No obvious calculi in the bile ducts. Electronically Signed   By: Aletta Edouard M.D.   On: 05/21/2020 08:45   Assessment/Plan The patient is a 86 year old female with multiple medical issues who presented to the [facility] emergency department with chief complaint of abdominal  pain and rectal bleeding.  1.  Abdominal pain/rectal  bleeding: Patient's symptoms have improved today.  No recent bowel movements.  Hemoglobin is normal.  Vital signs are stable.  If patient shows any signs of active bleeding would recommend a bleeding scan.  If a location of bleeding can be found would recommend moving forward with endovascular embolization.  Patient and daughter understand this is only if active bleeding is noted.  Procedure, risks and benefits explained.  All questions answered.  We will continue to monitor vital signs and lab work.  2.  Chronic SMA occlusion: Incidental finding of chronic SMA occlusion on CTA.  Collaterals are noted.  Patient denies any postprandial abdominal pain or nausea.  Denies any recent weight loss.  Due to the chronicity of the occlusion there is no role for endovascular or open intervention at this time.  3.  Right Renal Artery Stenosis: Finding of renal artery stenosis on CTA abdomen / pelvis.  Most recent labs performed today at 6:30 AM.  Normal creatinine.  Denies elevated blood pressures.  On two antihypertensive medications.  At this time, there is no indication for endovascular open intervention.  We will continue to monitor in the outpatient setting.  Discussed with Dr. Mayme Genta, PA-C  05/21/2020 11:33 AM  This note was created with Dragon medical transcription system.  Any error is purely unintentional

## 2020-05-21 NOTE — ED Notes (Signed)
Pt in bed. Daughter at bedside. Pt/daughter state coming into the ER due to blood in her stool. They stated that at 1pm she had blood in her stool and since it has only been noticeable on toilet paper when she wipes. Pt stated she also had abdominal pain this morning.

## 2020-05-21 NOTE — ED Notes (Signed)
Patient transported to CT 

## 2020-05-21 NOTE — ED Provider Notes (Addendum)
[facility] Emergency Department Provider Note  ____________________________________________   First MD Initiated Contact with Patient 05/21/20 [phone]     (approximate)  I have reviewed the triage vital signs and the nursing notes.   HISTORY  Chief Complaint Rectal Bleeding  Level 5 caveat: Patient is very hard of hearing and much of the history is provided by her daughter who is at bedside.  HPI [name] is a 86 y.o. female with medical history as listed below  who presents for evaluation of acute onset rectal bleeding yesterday afternoon associated with fecal incontinence, bowel urgency, and some generalized abdominal pain.  She had a general routine visit with her PCP earlier in the day.  She and her daughter then went to lunch at which time she developed the bowel urgency to the point that she had an episode of fecal incontinence of what her daughter described as bloody diarrhea there in the restaurant before she can get to the bathroom.  She continued to have intermittent abdominal cramping and has had at least a couple of additional episodes of diarrhea with blood in it although her daughter states that there was less blood than initially.  However at 1 point she said that the patient passed a "blob" that seem to contain both feces and blood.  They contacted their doctor again in the urgent care and some blood work was performed as well as some x-rays.  It was then recommended that she go directly to the emergency department.  Unfortunately due to overwhelming hospital and ED patient volumes, the patient has been waiting an extended period of time in the lobby (more than 10 hours).  During that time she said that she feels better than before but she still feels some pain in her lower abdomen from time to time.  She has had at least one additional episode of diarrhea with blood.  Her daughter describes it as light brown but with burgundy mixed in.  The  patient has never had a colonoscopy.  She has no known history of cancer.  No known history of diverticulosis.  She takes a daily baby aspirin but no other blood thinners.  She has a history of paroxysmal atrial fibrillation and has a pacemaker.  Symptoms were acute in onset and severe and nothing in particular made them better or worse.        Past Medical History:  Diagnosis Date  . Anemia   . Atrial fibrillation ([place])   . Dysrhythmia 2010   has pacemaker  . GERD (gastroesophageal reflux disease)   . HOH (hard of hearing)    can not hear at all in right ear. does not wear hearing aides  . Hypothyroidism   . Neuropathy    feet  . Peripheral vascular disease ([place])   . Pneumonia 2010   had collapsed lung  . Presence of permanent cardiac pacemaker    2010  . Skin cancer, basal cell    skin cancer   . Stroke ([place]) 2000   mini, per daughter. had blood clot in right ear  . Varicose veins   . Vertigo     Patient Active Problem List   Diagnosis Date Noted  . Nausea 11/18/2018  . PICC (peripherally inserted central catheter) in place 10/23/2018  . Medication monitoring encounter 09/02/2018  . Chronic osteomyelitis of ankle and foot, left ([place]) 08/14/2018  . History of sick sinus syndrome   . Atherosclerosis of native arteries of the extremities with  ulceration ([place]) 07/23/2018  . Urinary incontinence, mixed 11/09/2017  . Lymphedema 02/13/2017  . PVD (peripheral vascular disease) ([place]) 09/12/2016  . Lower limb ulcer, ankle, right, limited to breakdown of skin ([place]) 09/12/2016  . Varicose veins of lower extremities with ulcer ([place]) 09/12/2016  . Back pain, chronic 02/25/2016  . HLD (hyperlipidemia) 02/25/2016  . 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 Sick sinus syndrome ([place]) 02/25/2016  . Cerebrovascular accident (CVA) ([place]) 02/25/2016  . 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 Acquired hypothyroidism 05/04/2015  . AF (paroxysmal atrial fibrillation) ([place])  12/30/2014    Past Surgical History:  Procedure Laterality Date  . ANGIOPLASTY Right 2015   unsure if stent was placed. dr dew performed procedure  . APPENDECTOMY    . BASAL CELL CARCINOMA EXCISION Left   . BREAST CYST EXCISION Left   . CATARACT EXTRACTION Left   . CHOLECYSTECTOMY    . ECTROPION REPAIR Left 02/20/2020   Procedure: ECTROPION REPAIR, TARSAL WEDGE  ECTROPION REPAIR, EXTENSIVE;  Surgeon: Karle Starch, MD;  Location: [place];  Service: Ophthalmology;  Laterality: Left;  . EYE SURGERY Bilateral 2011, 2014   cataract surgery  . LOWER EXTREMITY ANGIOGRAPHY Left 11/11/2018   Procedure: LOWER EXTREMITY ANGIOGRAPHY;  Surgeon: Algernon Huxley, MD;  Location: [place] [facility];  Service: Cardiovascular;  Laterality: Left;  . PACEMAKER PLACEMENT    . PERIPHERAL VASCULAR CATHETERIZATION Right 10/09/2016   Procedure: Lower Extremity Angiography;  Surgeon: Algernon Huxley, MD;  Location: [place] [facility];  Service: Cardiovascular;  Laterality: Right;  . PERIPHERAL VASCULAR CATHETERIZATION  10/09/2016   Procedure: Lower Extremity Intervention;  Surgeon: Algernon Huxley, MD;  Location: [place] [facility];  Service: Cardiovascular;;  . TONSILLECTOMY      Prior to Admission medications   Medication Sig Start Date End Date Taking? Authorizing Provider  acetaminophen (TYLENOL) 650 MG CR tablet Take 650 mg by mouth every 8 (eight) hours as needed for pain.    [provider]  aspirin EC 81 MG tablet Take 81 mg by mouth daily.    [provider]  Biotin 1000 MCG tablet Take 1,000 mcg by mouth daily.    [provider]  calcium carbonate (OSCAL) 1500 (600 Ca) MG TABS tablet Take 600 mg of elemental calcium by mouth daily.    [provider]  Carboxymethylcellulose Sodium (ARTIFICIAL TEARS OP) Apply to eye as needed.    [provider]  CARTIA XT 300 MG 24 hr capsule Take 300 mg by mouth at bedtime.  09/09/16   [provider]    Cholecalciferol (VITAMIN D) 2000 units CAPS Take 2,000 Units by mouth daily.    [provider]  cyanocobalamin 1000 MCG tablet Take 1,000 mcg by mouth daily.    [provider]  dabigatran (PRADAXA) 150 MG CAPS capsule Take 150 mg by mouth 2 (two) times daily.    [provider]  erythromycin ophthalmic ointment Apply to sutures 4 times a day for 10-12 days.  Discontinue if allergy develops and call our office 02/20/20   Karle Starch, MD  furosemide (LASIX) 20 MG tablet Take 20 mg by mouth daily.     [provider]  Glucosamine 500 MG CAPS Take by mouth daily.    [provider]  levothyroxine (SYNTHROID, LEVOTHROID) 50 MCG tablet Take 50 mcg by mouth daily before breakfast.  09/11/16   [provider]  losartan (COZAAR) 50 MG tablet  11/18/18   [provider]  Magnesium 250 MG TABS Take by mouth daily.    [provider]  meloxicam (MOBIC) 7.5 MG tablet  12/20/18   [provider]  metoprolol (LOPRESSOR) 50 MG tablet Take 50 mg by mouth 2 (two) times daily. 09/22/16   [provider]  Multiple Vitamin (MULTIVITAMIN) tablet Take 1 tablet by mouth daily.    [provider]  Multiple Vitamins-Minerals (PRESERVISION/LUTEIN PO) Take 1 capsule by mouth daily.    [provider]  mupirocin ointment (BACTROBAN) 2 % Place 1 application into the nose as needed.    [provider]  neomycin-polymyxin-dexameth (MAXITROL) 0.1 % OINT Place 1 application into the left eye 2 (two) times daily.    [provider]  NONFORMULARY OR COMPOUNDED ITEM See pharmacy note 04/18/19   Hyatt, Max T, DPM  nystatin-triamcinolone (MYCOLOG II) cream Apply 1 application topically 2 (two) times daily as needed.    [provider]  omeprazole (PRILOSEC) 40 MG capsule Take 40 mg by mouth daily.  09/09/16   [provider]  potassium chloride SA (K-DUR,KLOR-CON) 20 MEQ tablet Take 20 mEq by  mouth daily. 08/18/16   [provider]  Red Yeast Rice Extract (RED YEAST RICE PO) Take by mouth daily.    [provider]  tolterodine (DETROL) 2 MG tablet Take 2 mg by mouth daily as needed (bladder control).  07/28/16   [provider]  traMADol (ULTRAM) 50 MG tablet Take 50 mg by mouth 2 (two) times daily.  09/16/16   [provider]  traMADol (ULTRAM) 50 MG tablet Take 1 every 4-6 hours as needed for pain not controlled by Tylenol 02/20/20   Karle Starch, MD  vitamin A 10000 UNIT capsule Take by mouth.    [provider]  vitamin C (ASCORBIC ACID) 500 MG tablet Take 1,000 mg by mouth daily.    [provider]  zinc sulfate 220 (50 Zn) MG capsule Take by mouth.    [provider]    Allergies Chlorpheniramine, Fluconazole, Gabapentin, Metronidazole, Monistat  [tioconazole], and Sulfa antibiotics  Family History  Problem Relation Age of Onset  . Stroke Mother   . Cancer Father     Social History Social History   Tobacco Use  . Smoking status: Never Smoker  . Smokeless tobacco: Never Used  Vaping Use  . Vaping Use: Never used  Substance Use Topics  . Alcohol use: No  . Drug use: No    Review of Systems Constitutional: No fever/chills Eyes: No visual changes. ENT: No sore throat. Cardiovascular: Denies chest pain. Respiratory: Denies shortness of breath. Gastrointestinal: Mild lower abdominal pain and cramping, diarrhea, rectal bleeding, fecal incontinence and bowel urgency. Genitourinary: Negative for dysuria. Musculoskeletal: Negative for neck pain.  Negative for back pain. Integumentary: Negative for rash. Neurological: Negative for headaches, focal weakness or numbness.   ____________________________________________   PHYSICAL EXAM:  VITAL SIGNS: ED Triage Vitals  Enc Vitals Group     BP 05/20/20 1953 (!) 143/75     Pulse Rate 05/20/20 1953 72     Resp 05/20/20 1953 18     Temp 05/20/20 1953  98.4 F (36.9 C)     Temp Source 05/20/20 1953 Oral     SpO2 05/20/20 1953 95 %     Weight 05/20/20 1954 71.2 kg (157 lb)     Height 05/20/20 1954 1.651 m (5\' 5" )     Head Circumference --      Peak Flow --  Pain Score 05/20/20 2000 0     Pain Loc --      Pain Edu? --      Excl. in [place]? --     Constitutional: Alert and oriented.  Eyes: Conjunctivae are normal.  Head: Atraumatic. Nose: No congestion/rhinnorhea. Mouth/Throat: Patient is wearing a mask. Neck: No stridor.  No meningeal signs.   Cardiovascular: Normal rate, regular rhythm. Good peripheral circulation. Grossly normal heart sounds. Respiratory: Normal respiratory effort.  No retractions. Gastrointestinal: Soft and nondistended.  Mild tenderness to palpation in the lower middle and left side of her abdomen with mild tenderness and mild rebound.  No tenderness to palpation in the right lower quadrant. Musculoskeletal: No lower extremity tenderness nor edema. No gross deformities of extremities. Neurologic:  Normal speech and language. No gross focal neurologic deficits are appreciated other than hard of hearing. Skin:  Skin is warm, dry and intact.   ____________________________________________   LABS (all labs ordered are listed, but only abnormal results are displayed)  Labs Reviewed  CBC WITH DIFFERENTIAL/PLATELET - Abnormal; Notable for the following components:      Result Value   MCH 25.8 (*)    RDW 17.5 (*)    Monocytes Absolute 1.2 (*)    All other components within normal limits  APTT - Abnormal; Notable for the following components:   aPTT 41 (*)    All other components within normal limits  COMPREHENSIVE METABOLIC PANEL - Abnormal; Notable for the following components:   Glucose, Bld 103 (*)    BUN 28 (*)    GFR calc non Af Amer 50 (*)    GFR calc Af Amer 58 (*)    All other components within normal limits  SARS CORONAVIRUS 2 BY RT PCR (HOSPITAL ORDER, [facility])    PROTIME-INR  LIPASE, BLOOD  TYPE AND SCREEN  TYPE AND SCREEN   ____________________________________________  EKG  No indication for emergent EKG ____________________________________________  RADIOLOGY Ursula Alert, personally viewed and evaluated these images (plain radiographs) as part of my medical decision making, as well as reviewing the written report by the radiologist.  ED MD interpretation: CT abdomen/pelvis is pending at the time of transfer of care.  Official radiology report(s): No results found.  ____________________________________________   PROCEDURES   Procedure(s) performed (including Critical Care):  Procedures   ____________________________________________   INITIAL IMPRESSION / MDM / ASSESSMENT AND PLAN / ED COURSE  As part of my medical decision making, I reviewed the following data within the [place] History obtained from family, Nursing notes reviewed and incorporated, Labs reviewed , Old chart reviewed, Patient signed out to Dr. Charna Archer and reviewed Notes from prior ED visits   Differential diagnosis includes, but is not limited to, diverticular bleed, neoplasm, AV malformation, ischemic colitis, other nonspecific infectious colitis.  The patient's lab results from the urgent care earlier today are visible in [place] and are essentially normal with only very mild leukocytosis of just over 11 and an essentially normal CBC.  However she has been waiting for over 10 hours in the ED and it has been about 12 hours since her labs were performed.  I am repeating the lab work including coagulation studies and type and screen.  Her physical exam is notable for light brown stool with what appears to be melena mixed in which is strongly Hemoccult positive.  She has never had a colonoscopy.  She has some tenderness to palpation of the abdomen.  I have decided to proceed with a CTA abdomen/pelvis to look for signs of active bleeding  as well as any other acute abnormality that could explain the bleeding.  I explained to the patient's daughter that she will require admission given her age, comorbidities, and acute symptoms.  They understand and agree with the plan.  Given that the results of the CT scan could change the service to home she is admitted (potentially requiring vascular or surgery consultations at the least), I am holding off on consulting hospitalist until the full work-up is complete.  I ordered a COVID-19 test as well.  With the plan for admission regardless of the imaging results, I am transferring ED care to Dr. Charna Archer to follow-up the results and admit the patient when appropriate.       Clinical Course as of May 21 742  Fri May 21, 2020  0659 Normal CBC with stable Hb  CBC with Differential/Platelet(!) [CF]    Clinical Course User Index [CF] Hinda Kehr, MD     ____________________________________________  FINAL CLINICAL IMPRESSION(S) / ED DIAGNOSES  Final diagnoses:  Diarrhea, unspecified type  Rectal bleeding  Melena  Lower abdominal pain     MEDICATIONS GIVEN DURING THIS VISIT:  Medications  sodium chloride 0.9 % bolus 500 mL (500 mLs Intravenous New Bag/Given 05/21/20 0719)     ED Discharge Orders    None      *Please note:  [name] was evaluated in Emergency Department on 05/21/2020 for the symptoms described in the history of present illness. She was evaluated in the context of the global COVID-19 pandemic, which necessitated consideration that the patient might be at risk for infection with the SARS-CoV-2 virus that causes COVID-19. Institutional protocols and algorithms that pertain to the evaluation of patients at risk for COVID-19 are in a state of rapid change based on information released by regulatory bodies including the CDC and federal and state organizations. These policies and algorithms were followed during the patient's care in the ED.  Some ED evaluations  and interventions may be delayed as a result of limited staffing during and after the pandemic.*  Note:  This document was prepared using Dragon voice recognition software and may include unintentional dictation errors.   Hinda Kehr, MD 05/21/20 7209    Hinda Kehr, MD 05/21/20 [phone]

## 2020-05-21 NOTE — H&P (Signed)
History and Physical    CALINA PATRIE BPZ:[id number] DOB: 05-Jun-1923 DOA: 05/21/2020  Referring MD/NP/PA:   PCP: Leonel Ramsay, MD   Patient coming from:  The patient is coming from home.  At baseline, pt is independent for most of ADL.        Chief Complaint: rectal bleeding  HPI: [name] is a 86 y.o. female with medical history significant of hypertension, hyperlipidemia, stroke, GERD, hypothyroidism, pacemaker placement, PVD, vertigo, hard of hearing, atrial fibrillation on Pradaxa, anemia, varicose veins in the legs, who presents with rectal bleeding.  Patient states that she has had 3-4 times of rectal bleeding yesterday and 2 small rectal bleeding today, with bright red blood. Denies nausea and vomiting.  Patient has mild diffuse cramping abdominal pain.  No chest pain, shortness breath, cough, fever or chills.  Denies dizziness or lightheadedness.  No unilateral weakness.  ED Course: pt was found to have hemoglobin 12.3 --> 11.4, WBC 8.3, INR 1.2, PTT 41, pending COVID-19 PCR, electrolytes renal function okay, temperature normal, blood pressure 158/75, RR 73, RR 18, oxygen saturation 97% on room air. 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 Pt is placed on MedSurg bed for observation.  Dr. Lucky Cowboy of [facility] is consulted.  Dr. Alice Reichert of [facility] is consulted.   Review of Systems:   General: no fevers, chills, no body weight gain, has fatigue HEENT: no blurry vision, hearing changes or sore throat Respiratory: no dyspnea, coughing, wheezing CV: no chest pain, no palpitations GI: no nausea, vomiting, has abdominal pain and rectal bleeding, no constipation GU: no dysuria, burning on urination, increased urinary frequency, hematuria  Ext: no leg edema Neuro: no unilateral weakness, numbness, or tingling, no vision change or hearing loss Skin: no rash, no skin tear. MSK: No muscle spasm, no deformity, no  limitation of range of movement in spin Heme: No easy bruising.  Travel history: No recent long distant travel.  Allergy:  Allergies  Allergen Reactions  . Chlorpheniramine     Other reaction(s): Unknown Unsure who ordered this med or what it was for. `  . Fluconazole Rash  . Gabapentin Rash  . Metronidazole Nausea And Vomiting, Other (See Comments) and Rash  . Monistat  [Tioconazole] Rash and Other (See Comments)  . Sulfa Antibiotics Rash and Nausea And Vomiting    Past Medical History:  Diagnosis Date  . Anemia   . Atrial fibrillation ([place])   . Dysrhythmia 2010   has pacemaker  . GERD (gastroesophageal reflux disease)   . HOH (hard of hearing)    can not hear at all in right ear. does not wear hearing aides  . Hypothyroidism   . Neuropathy    feet  . Peripheral vascular disease ([place])   . Pneumonia 2010   had collapsed lung  . Presence of permanent cardiac pacemaker    2010  . Skin cancer, basal cell    skin cancer   . Stroke ([place]) 2000   mini, per daughter. had blood clot in right ear  . Varicose veins   . Vertigo     Past Surgical History:  Procedure Laterality Date  . ANGIOPLASTY Right 2015   unsure if stent was placed. dr dew performed procedure  . APPENDECTOMY    . BASAL CELL CARCINOMA EXCISION Left   . BREAST CYST EXCISION Left   . CATARACT EXTRACTION Left   . CHOLECYSTECTOMY    .  ECTROPION REPAIR Left 02/20/2020   Procedure: ECTROPION REPAIR, TARSAL WEDGE  ECTROPION REPAIR, EXTENSIVE;  Surgeon: Karle Starch, MD;  Location: [facility];  Service: Ophthalmology;  Laterality: Left;  . EYE SURGERY Bilateral 2011, 2014   cataract surgery  . LOWER EXTREMITY ANGIOGRAPHY Left 11/11/2018   Procedure: LOWER EXTREMITY ANGIOGRAPHY;  Surgeon: Algernon Huxley, MD;  Location: [facility];  Service: Cardiovascular;  Laterality: Left;  . PACEMAKER PLACEMENT    . PERIPHERAL VASCULAR CATHETERIZATION Right 10/09/2016   Procedure: Lower Extremity  Angiography;  Surgeon: Algernon Huxley, MD;  Location: [facility];  Service: Cardiovascular;  Laterality: Right;  . PERIPHERAL VASCULAR CATHETERIZATION  10/09/2016   Procedure: Lower Extremity Intervention;  Surgeon: Algernon Huxley, MD;  Location: [facility];  Service: Cardiovascular;;  . TONSILLECTOMY      Social History:  reports that she has never smoked. She has never used smokeless tobacco. She reports that she does not drink alcohol and does not use drugs.  Family History:  Family History  Problem Relation Age of Onset  . Stroke Mother   . Cancer Father      Prior to Admission medications   Medication Sig Start Date End Date Taking? Authorizing Provider  acetaminophen (TYLENOL) 650 MG CR tablet Take 650 mg by mouth every 8 (eight) hours as needed for pain.    [provider]  aspirin EC 81 MG tablet Take 81 mg by mouth daily.    [provider]  Biotin 1000 MCG tablet Take 1,000 mcg by mouth daily.    [provider]  calcium carbonate (OSCAL) 1500 (600 Ca) MG TABS tablet Take 600 mg of elemental calcium by mouth daily.    [provider]  Carboxymethylcellulose Sodium (ARTIFICIAL TEARS OP) Apply to eye as needed.    [provider]  CARTIA XT 300 MG 24 hr capsule Take 300 mg by mouth at bedtime.  09/09/16   [provider]  Cholecalciferol (VITAMIN D) 2000 units CAPS Take 2,000 Units by mouth daily.    [provider]  cyanocobalamin 1000 MCG tablet Take 1,000 mcg by mouth daily.    [provider]  dabigatran (PRADAXA) 150 MG CAPS capsule Take 150 mg by mouth 2 (two) times daily.    [provider]  erythromycin ophthalmic ointment Apply to sutures 4 times a day for 10-12 days.  Discontinue if allergy develops and call our office 02/20/20   Karle Starch, MD  furosemide (LASIX) 20 MG tablet Take 20 mg by mouth daily.     [provider]  Glucosamine 500 MG CAPS Take by mouth daily.     [provider]  levothyroxine (SYNTHROID, LEVOTHROID) 50 MCG tablet Take 50 mcg by mouth daily before breakfast.  09/11/16   [provider]  losartan (COZAAR) 50 MG tablet  11/18/18   [provider]  Magnesium 250 MG TABS Take by mouth daily.    [provider]  meloxicam (MOBIC) 7.5 MG tablet  12/20/18   [provider]  metoprolol (LOPRESSOR) 50 MG tablet Take 50 mg by mouth 2 (two) times daily. 09/22/16   [provider]  Multiple Vitamin (MULTIVITAMIN) tablet Take 1 tablet by mouth daily.    [provider]  Multiple Vitamins-Minerals (PRESERVISION/LUTEIN PO) Take 1 capsule by mouth daily.    [provider]  mupirocin ointment (BACTROBAN) 2 % Place 1 application into the nose as needed.  [provider]  neomycin-polymyxin-dexameth (MAXITROL) 0.1 % OINT Place 1 application into the left eye 2 (two) times daily.    [provider]  NONFORMULARY OR COMPOUNDED ITEM See pharmacy note 04/18/19   Hyatt, Max T, DPM  nystatin-triamcinolone (MYCOLOG II) cream Apply 1 application topically 2 (two) times daily as needed.    [provider]  omeprazole (PRILOSEC) 40 MG capsule Take 40 mg by mouth daily.  09/09/16   [provider]  potassium chloride SA (K-DUR,KLOR-CON) 20 MEQ tablet Take 20 mEq by mouth daily. 08/18/16   [provider]  Red Yeast Rice Extract (RED YEAST RICE PO) Take by mouth daily.    [provider]  tolterodine (DETROL) 2 MG tablet Take 2 mg by mouth daily as needed (bladder control).  07/28/16   [provider]  traMADol (ULTRAM) 50 MG tablet Take 50 mg by mouth 2 (two) times daily.  09/16/16   [provider]  traMADol (ULTRAM) 50 MG tablet Take 1 every 4-6 hours as needed for pain not controlled by Tylenol 02/20/20   Karle Starch, MD  vitamin A 10000 UNIT capsule Take by mouth.    [provider]  vitamin C (ASCORBIC ACID) 500  MG tablet Take 1,000 mg by mouth daily.    [provider]  zinc sulfate 220 (50 Zn) MG capsule Take by mouth.    [provider]    Physical Exam: Vitals:   05/21/20 6962 05/21/20 0935 05/21/20 0936 05/21/20 0937  BP:      Pulse: 89 90 97 (!) 104  Resp:      Temp:      TempSrc:      SpO2: 96% 95% 91% 97%  Weight:      Height:       General: Not in acute distress HEENT:       Eyes: PERRL, EOMI, no scleral icterus.       ENT: No discharge from the ears and nose, no pharynx injection, no tonsillar enlargement.        Neck: No JVD, no bruit, no mass felt. Heme: No neck lymph node enlargement. Cardiac: S1/S2, RRR, No murmurs, No gallops or rubs. Respiratory: No rales, wheezing, rhonchi or rubs. GI: Soft, nondistended, has mild tenderness in central abdomen, no rebound pain, no organomegaly, BS present. GU: No hematuria Ext: No pitting leg edema bilaterally. 2+DP/PT pulse bilaterally. Musculoskeletal: No joint deformities, No joint redness or warmth, no limitation of ROM in spin. Skin: No rashes.  Neuro: Alert, oriented X3, cranial nerves II-XII grossly intact, moves all extremities normally  Psych: Patient is not psychotic, no suicidal or hemocidal ideation.  Labs on Admission: I have personally reviewed following labs and imaging studies  CBC: Recent Labs  Lab 05/21/20 0631  WBC 8.3  NEUTROABS 4.6  HGB 12.3  HCT 40.2  MCV 84.5  PLT 952   Basic Metabolic Panel: Recent Labs  Lab 05/21/20 0631  NA 140  K 4.1  CL 106  CO2 24  GLUCOSE 103*  BUN 28*  CREATININE 0.97  CALCIUM 9.0   GFR: Estimated Creatinine Clearance: 35.1 mL/min (by C-G formula based on SCr of 0.97 mg/dL). Liver Function Tests: Recent Labs  Lab 05/21/20 0631  AST 26  ALT 17  ALKPHOS 107  BILITOT 0.6  PROT 8.0  ALBUMIN 3.8   Recent Labs  Lab 05/21/20 0631  LIPASE 39   No results for input(s): AMMONIA in the last 168 hours. Coagulation Profile:  Recent Labs  Lab  05/21/20 0631  INR 1.2   Cardiac Enzymes: No results for input(s): CKTOTAL, CKMB, CKMBINDEX, TROPONINI in the last 168 hours. BNP (last 3 results) No results for input(s): PROBNP in the last 8760 hours. HbA1C: No results for input(s): HGBA1C in the last 72 hours. CBG: No results for input(s): GLUCAP in the last 168 hours. Lipid Profile: No results for input(s): CHOL, HDL, LDLCALC, TRIG, CHOLHDL, LDLDIRECT in the last 72 hours. Thyroid Function Tests: No results for input(s): TSH, T4TOTAL, FREET4, T3FREE, THYROIDAB in the last 72 hours. Anemia Panel: No results for input(s): VITAMINB12, FOLATE, FERRITIN, TIBC, IRON, RETICCTPCT in the last 72 hours. Urine analysis:    Component Value Date/Time   COLORURINE Yellow 08/30/2012 1755   APPEARANCEUR Clear 08/30/2012 1755   LABSPEC 1.008 08/30/2012 1755   PHURINE 6.0 08/30/2012 1755   GLUCOSEU Negative 08/30/2012 1755   HGBUR Negative 08/30/2012 1755   BILIRUBINUR Negative 08/30/2012 1755   KETONESUR Negative 08/30/2012 1755   PROTEINUR Negative 08/30/2012 1755   NITRITE Negative 08/30/2012 1755   LEUKOCYTESUR Negative 08/30/2012 1755   Sepsis Labs: @LABRCNTIP (procalcitonin:4,lacticidven:4) ) Recent Results (from the past 240 hour(s))  SARS Coronavirus 2 by RT PCR (hospital order, performed in [place] hospital lab) Nasopharyngeal Nasopharyngeal Swab     Status: None   Collection Time: 05/21/20  7:23 AM   Specimen: Nasopharyngeal Swab  Result Value Ref Range Status   SARS Coronavirus 2 NEGATIVE NEGATIVE Final    Comment: (NOTE) SARS-CoV-2 target nucleic acids are NOT DETECTED.  The SARS-CoV-2 RNA is generally detectable in upper and lower respiratory specimens during the acute phase of infection. The lowest concentration of SARS-CoV-2 viral copies this assay can detect is 250 copies / mL. A negative result does not preclude SARS-CoV-2 infection and should not be used as the sole basis for treatment or other patient  management decisions.  A negative result may occur with improper specimen collection / handling, submission of specimen other than nasopharyngeal swab, presence of viral mutation(s) within the areas targeted by this assay, and inadequate number of viral copies (<250 copies / mL). A negative result must be combined with clinical observations, patient history, and epidemiological information.  Fact Sheet for Patients:   StrictlyIdeas.no  Fact Sheet for Healthcare Providers: BankingDealers.co.za  This test is not yet approved or  cleared by the [place] FDA and has been authorized for detection and/or diagnosis of SARS-CoV-2 by FDA under an Emergency Use Authorization (EUA).  This EUA will remain in effect (meaning this test can be used) for the duration of the COVID-19 declaration under Section 564(b)(1) of the Act, 21 U.S.C. section 360bbb-3(b)(1), unless the authorization is terminated or revoked sooner.  Performed at [facility], [address] [place], [place] [address]      Radiological Exams on Admission: CT Angio Abd/Pel W and/or Wo Contrast  Result Date: 05/21/2020 CLINICAL DATA:  Rectal bleeding, melena, abdominal pain, diarrhea and fecal incontinence. EXAM: CT ANGIOGRAPHY ABDOMEN AND PELVIS WITH CONTRAST AND WITHOUT CONTRAST TECHNIQUE: Multidetector CT imaging of the abdomen and pelvis was performed using the standard protocol during bolus administration of intravenous contrast. Multiplanar reconstructed images and MIPs were obtained and reviewed to evaluate the vascular anatomy. CONTRAST:  75mL OMNIPAQUE IOHEXOL 350 MG/ML SOLN COMPARISON:  Prior CT of the abdomen and pelvis without contrast on 03/22/2012 and CT of the chest without contrast on 07/13/2016 FINDINGS: VASCULAR Aorta: Tortuous aorta containing calcified plaque without evidence of aneurysm or dissection. Celiac:  Calcified plaque at the origin of the celiac  axis without significant stenosis. There appear to be well developed collaterals via the pancreaticoduodenal arcade supplied by the gastroduodenal artery and reconstituting the SMA trunk and SMA branches. SMA: The proximal trunk of the SMA is likely completely occluded or essentially completely occluded by a large amount of heavily calcified plaque beginning at the origin and continuing for approximately a 2.5 cm segment into the proximal trunk. The trunk is atretic distal to the calcified plaque supporting complete occlusion and suddenly becomes reconstituted by visible collateral supply from the GDA and pancreaticoduodenal arcade. Reconstituted distal branches appear patent. Renals: Single renal arteries bilaterally. Calcified and noncalcified plaque in the proximal right renal artery causes likely significant stenosis of 70% or greater. Calcified plaque at the origin of a left renal artery that demonstrates almost immediate bifurcation. No evidence of significant left renal artery stenosis. IMA: Patent and enlarged IMA with visible collateral supply to SMA branches. This finding also supports likely chronic proximal SMA occlusion. Inflow: Calcified atherosclerosis without evidence of aneurysmal disease or significant obstructive disease of the iliac arteries bilaterally. Proximal Outflow: Common femoral arteries show scattered plaque bilaterally without significant stenosis. Femoral bifurcations are patent bilaterally. Veins: Venous phase imaging demonstrates no evidence of varices or venous occlusions. The portal vein, visualized mesenteric veins and splenic vein are normally patent. Review of the MIP images confirms the above findings. NON-VASCULAR Lower chest: Small to moderate-sized hiatal hernia. Bibasilar pulmonary scarring. Hepatobiliary: No focal liver abnormality is seen. Status post cholecystectomy. Mild biliary dilatation of extrahepatic bile ducts is likely due to prior cholecystectomy. The common  hepatic and common bile ducts measure up to 9-11 mm. No obvious calculi in the bile ducts. Pancreas: Unremarkable. No pancreatic ductal dilatation or surrounding inflammatory changes. Spleen: Normal spleen size. Stable low-density lesion in the inferior spleen is consistent with cyst or hemangioma and measures approximately 1.3 cm. Adrenals/Urinary Tract: Adrenal glands are unremarkable. Kidneys are normal, without renal calculi, focal lesion, or hydronephrosis. Bladder is unremarkable. Stomach/Bowel: Bowel shows no evidence of obstruction, ileus, inflammation or visible lesion without oral contrast. No free air. Unenhanced, arterial phase and venous phase imaging demonstrates no evidence of active bleeding or contrast extravasation into the gastrointestinal tract. Lymphatic: No enlarged abdominal or pelvic lymph nodes. Reproductive: Small postmenopausal uterus. No adnexal masses identified. Other: No ascites or focal fluid collections identified. Small right inguinal hernia contains a short segment of distal small bowel without evidence of incarceration. Small left inguinal hernia contains fat. Musculoskeletal: Degenerative disc disease with associated degenerative scoliosis of the thoracolumbar and lumbar spines. No evidence of fracture or bony lesion. IMPRESSION: 1. Likely chronic occlusion or near complete occlusion of the proximal trunk of the SMA by a large amount of heavily calcified plaque beginning at the origin and continuing for approximately a 2.5 cm segment into the proximal trunk. The trunk is atretic distal to the calcified plaque supporting complete occlusion and suddenly becomes reconstituted by visible prominent collateral supply from the GDA and pancreaticoduodenal arcade. This also supports likely chronic proximal SMA occlusion. 2. The IMA is also enlarged with visible collateral supply to SMA branches supporting chronic SMA occlusion. 3. No visible acute bowel pathology or active bleeding into  the gastrointestinal tract by CTA. 4. Probable significant stenosis of the proximal right renal artery of 70% or greater. 5. Small to moderate-sized hiatal hernia. 6. Small right inguinal hernia containing a short segment of distal small bowel without evidence of incarceration. Small left inguinal hernia  containing fat. 7. Mild biliary dilatation is likely due to prior cholecystectomy. The common hepatic and common bile ducts measure up to 9-11 mm. No obvious calculi in the bile ducts. Electronically Signed   By: Aletta Edouard M.D.   On: 05/21/2020 08:45     EKG: Not done in ED, will get one.   Assessment/Plan Principal Problem:   Rectal bleeding Active Problems:   Acquired hypothyroidism   AF (paroxysmal atrial fibrillation) (HCC)   GERD (gastroesophageal reflux disease)   Stroke (HCC)   HTN (hypertension)   Rectal bleeding: Patient has never had a colonoscopy before.  Hemoglobin 12.3 -->11.4.  Hemodynamically stable currently.  CT angiogram showed chronic occlusion of SMA.  Dr. Lucky Cowboy of [facility] and Dr. Alice Reichert of GI are consulted.  - will place in med-surg bed obs - hold Pradaxa, Protonix, Mobic - IVF: 500 mL NS bolus, then at 75 mL/hr - Start IV pantoprazole 40 mg bid - Zofran IV for nausea - Avoid NSAIDs and SQ heparin - Maintain IV access (2 large bore IVs if possible). - Monitor closely and follow q6h cbc, transfuse as necessary, if Hgb<7.0 - LaB: INR, PTT and type screen  Acquired hypothyroidism -Synthroid  AF (paroxysmal atrial fibrillation) (HCC) -Hold Pradaxa -Continue metoprolol and diltiazem  GERD (gastroesophageal reflux disease) -On IV Protonix  Stroke (HCC) -Hold aspirin and Pradaxa due to GI bleeding  HTN (hypertension) -IV hydralazine as needed -Continue metoprolol, diltiazem, Cozaar -Hold Lasix    DVT ppx: SCD Code Status: Partial code (I discussed with the patient in the presence of her daughter, and explained the meaning of CODE STATUS, patient wants  to be partial code, OK for CPR, but no intubation). Family Communication: Yes, patient's daughter  at bed side Disposition Plan:  Anticipate discharge back to previous environment Consults called:  Dr. Alice Reichert Admission status: Med-surg bed for obs     Status is: Observation  The patient remains OBS appropriate and will d/c before 2 midnights.  Dispo: The patient is from: Home              Anticipated d/c is to: Home              Anticipated d/c date is: 1 day              Patient currently is not medically stable to d/c.            Date of Service 05/21/2020    Ivor Costa [facility]   If 7PM-7AM, please contact night-coverage [email or web address] 05/21/2020, 9:53 AM

## 2020-05-21 NOTE — ED Notes (Signed)
Transporting pt to room 220A via wheelchair

## 2020-05-21 NOTE — ED Notes (Signed)
ED Provider at bedside. 

## 2020-05-21 NOTE — Consult Note (Signed)
GI Inpatient Consult Note  Reason for Consult: Rectal bleeding    Attending Requesting Consult: Dr. Ivor Costa, MD  History of Present Illness: [name] is a 86 y.o. female seen for evaluation of rectal bleeding at the request of Dr. Blaine Hamper. Pt has a PMH of atrial fibrillation on Pradaxa, s/p pacemaker, PVD, Hx of basal cell cancer, and hard of hearing. Daughter present at bedside and helps with history. Patient reports acute onset of rectal bleeding that occurred yesterday at the [employer]. She reports she had an episode of lower abdominal cramping with fecal urgency and went up in attempt to get the restaurant, but was unable to reach the restroom in time and had episode of fecal incontinence for which her daughter describes as severe bloody diarrhea. She had several more episodes of diarrhea with bright red to dark blood with clots in it. Patient and daughter went to the ED at the advice of her doctor and they presented to the [facility] ED. Patient reports they checked in last night around 8PM, but were not seen until this morning at 6 AM. Pt had appx one more episode last night of stool which was light brown in color with some burgundy mixed in. 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 Patient did not receive any blood transfusions. She is seen this afternoon resting comfortably in hospital bed. She has had no further episodes of hematochezia and denies any fever, nausea, vomiting, or abdominal cramping. She has never had issues with rectal bleeding before. She has never had a colonoscopy. Vascular surgery was consulted and advised no immediate evaluation. She is on Pradaxa at home.     Past Medical History:  Past Medical History:  Diagnosis Date   Anemia    Atrial fibrillation [facility])    Dysrhythmia 2010   has pacemaker   GERD (gastroesophageal reflux  disease)    HOH (hard of hearing)    can not hear at all in right ear. does not wear hearing aides   Hypothyroidism    Neuropathy    feet   Peripheral vascular disease ([place])    Pneumonia 2010   had collapsed lung   Presence of permanent cardiac pacemaker    2010   Skin cancer, basal cell    skin cancer    Stroke ([place]) 2000   mini, per daughter. 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   GERD (gastroesophageal reflux disease)    Nausea 11/18/2018   PICC (peripherally inserted central catheter) in place 10/23/2018   Medication monitoring encounter 09/02/2018   Chronic osteomyelitis of ankle and foot, left ([place]) 08/14/2018   History of sick sinus syndrome    Atherosclerosis of native arteries of the extremities with ulceration ([place]) 07/23/2018   Urinary incontinence, mixed 11/09/2017   Lymphedema 02/13/2017   PVD (peripheral vascular disease) ([place]) 09/12/2016   Lower limb ulcer, ankle, right, limited to breakdown of skin ([place]) 09/12/2016   Varicose veins of lower extremities with ulcer ([place]) 09/12/2016   Back pain, chronic 02/25/2016   HLD (hyperlipidemia) 02/25/2016   Osteoporosis, post-menopausal 02/25/2016   Sick sinus syndrome ([place]) 02/25/2016   Cerebrovascular accident (CVA) ([place]) 02/25/2016   Inflammatory  neuropathy ([place]) 11/19/2015   Acquired hypothyroidism 05/04/2015   AF (paroxysmal atrial fibrillation) ([place]) 12/30/2014   Stroke ([place]) 2000   HTN (hypertension) 2000   Rectal bleeding 2000    Past Surgical History: Past Surgical History:  Procedure Laterality Date   ANGIOPLASTY Right 2015   unsure if stent was placed. dr dew performed procedure   APPENDECTOMY     BASAL CELL CARCINOMA EXCISION Left    BREAST CYST EXCISION Left    CATARACT EXTRACTION Left    CHOLECYSTECTOMY      ECTROPION REPAIR Left 02/20/2020   Procedure: ECTROPION REPAIR, TARSAL WEDGE  ECTROPION REPAIR, EXTENSIVE;  Surgeon: Karle Starch, MD;  Location: [facility];  Service: Ophthalmology;  Laterality: Left;   EYE SURGERY Bilateral 2011, 2014   cataract surgery   LOWER EXTREMITY ANGIOGRAPHY Left 11/11/2018   Procedure: LOWER EXTREMITY ANGIOGRAPHY;  Surgeon: Algernon Huxley, MD;  Location: [facility];  Service: Cardiovascular;  Laterality: Left;   PACEMAKER PLACEMENT     PERIPHERAL VASCULAR CATHETERIZATION Right 10/09/2016   Procedure: Lower Extremity Angiography;  Surgeon: Algernon Huxley, MD;  Location: [name];  Service: Cardiovascular;  Laterality: Right;   PERIPHERAL VASCULAR CATHETERIZATION  10/09/2016   Procedure: Lower Extremity Intervention;  Surgeon: Algernon Huxley, MD;  Location: [facility];  Service: Cardiovascular;;   TONSILLECTOMY      Allergies: Allergies  Allergen Reactions   Chlorpheniramine     Other reaction(s): Unknown Unsure who ordered this med or what it was for. `   Fluconazole Rash   Gabapentin Rash   Metronidazole Nausea And Vomiting, Other (See Comments) and Rash   Monistat  [Tioconazole] Rash and Other (See Comments)   Sulfa Antibiotics Rash and Nausea And Vomiting    Home Medications: Medications Prior to Admission  Medication Sig Dispense Refill Last Dose   dabigatran (PRADAXA) 150 MG CAPS capsule Take 150 mg by mouth 2 (two) times daily.   05/20/2020 at 1000   diltiazem (TIAZAC) 300 MG 24 hr capsule Take 300 mg by mouth daily.   05/19/2020 at 2000   furosemide (LASIX) 20 MG tablet Take 20 mg by mouth daily.    05/20/2020 at 1000   levothyroxine (SYNTHROID) 25 MCG tablet Take 25 mcg by mouth daily before breakfast. Administer on  Tuesday and thursdays only   05/20/2020 at 0800   levothyroxine (SYNTHROID, LEVOTHROID) 50 MCG tablet Take 50 mcg by mouth daily before breakfast. Everyday except Tuesday and thursday    05/19/2020 at 0800   losartan (COZAAR) 50 MG tablet Take 50 mg by mouth daily.    05/20/2020 at 1000   metoprolol (LOPRESSOR) 50 MG tablet Take 50 mg by mouth 2 (two) times daily.   05/20/2020 at 1000   omeprazole (PRILOSEC) 40 MG capsule Take 40 mg by mouth daily.    05/20/2020 at 1000   potassium chloride SA (K-DUR,KLOR-CON) 20 MEQ tablet Take 20 mEq by mouth daily.   05/20/2020 at 1000   acetaminophen (TYLENOL) 650 MG CR tablet Take 650 mg by mouth every 8 (eight) hours as needed for pain.   unknown at prn   aspirin EC 81 MG tablet Take 81 mg by mouth daily. (Patient not taking: Reported on 05/21/2020)   Not Taking at Unknown time   Biotin 1000 MCG tablet Take 1,000 mcg by mouth daily. (Patient not taking: Reported on 05/21/2020)   Not Taking at Unknown time   calcium carbonate (OSCAL) 1500 (600 Ca)  MG TABS tablet Take 600 mg of elemental calcium by mouth daily. (Patient not taking: Reported on 05/21/2020)   Not Taking at Unknown time   Carboxymethylcellulose Sodium (ARTIFICIAL TEARS OP) Apply to eye as needed.   unknown at prn   CARTIA XT 300 MG 24 hr capsule Take 300 mg by mouth at bedtime.  (Patient not taking: Reported on 05/21/2020)   Not Taking at Unknown time   Cholecalciferol (VITAMIN D) 2000 units CAPS Take 2,000 Units by mouth daily. (Patient not taking: Reported on 05/21/2020)   Not Taking at Unknown time   cyanocobalamin 1000 MCG tablet Take 1,000 mcg by mouth daily. (Patient not taking: Reported on 05/21/2020)   Not Taking at Unknown time   erythromycin ophthalmic ointment Apply to sutures 4 times a day for 10-12 days.  Discontinue if allergy develops and call our office (Patient not taking: Reported on 05/21/2020) 3.5 g 2 Completed Course at Unknown time   Glucosamine 500 MG CAPS Take by mouth daily. (Patient not taking: Reported on 05/21/2020)   Not Taking at Unknown time   Magnesium 250 MG TABS Take by mouth daily. (Patient not taking: Reported on 05/21/2020)   Not Taking at  Unknown time   meloxicam (MOBIC) 7.5 MG tablet  (Patient not taking: Reported on 05/21/2020)   Completed Course at Unknown time   Multiple Vitamin (MULTIVITAMIN) tablet Take 1 tablet by mouth daily. (Patient not taking: Reported on 05/21/2020)   Not Taking at Unknown time   Multiple Vitamins-Minerals (PRESERVISION AREDS 2+MULTI VIT PO) Take by mouth. (Patient not taking: Reported on 05/21/2020)   Not Taking at Unknown time   Multiple Vitamins-Minerals (PRESERVISION/LUTEIN PO) Take 1 capsule by mouth daily. (Patient not taking: Reported on 05/21/2020)   Not Taking at Unknown time   mupirocin ointment (BACTROBAN) 2 % Place 1 application into the nose as needed.   unknown at prn   neomycin-polymyxin-dexameth (MAXITROL) 0.1 % OINT Place 1 application into the left eye 2 (two) times daily. (Patient not taking: Reported on 05/21/2020)   Not Taking at Unknown time   NONFORMULARY OR COMPOUNDED ITEM See pharmacy note 120 each 2    nystatin-triamcinolone (MYCOLOG II) cream Apply 1 application topically 2 (two) times daily as needed.   unknown at prn   Red Yeast Rice Extract (RED YEAST RICE PO) Take by mouth daily. (Patient not taking: Reported on 05/21/2020)   Not Taking at Unknown time   tolterodine (DETROL) 2 MG tablet Take 2 mg by mouth daily as needed (bladder control).    05/19/2020 at 2000   traMADol (ULTRAM) 50 MG tablet Take 1 every 4-6 hours as needed for pain not controlled by Tylenol (Patient not taking: Reported on 05/21/2020) 6 tablet 0 Not Taking at Unknown time   vitamin A 10000 UNIT capsule Take by mouth. (Patient not taking: Reported on 05/21/2020)   Not Taking at Unknown time   vitamin C (ASCORBIC ACID) 500 MG tablet Take 1,000 mg by mouth daily. (Patient not taking: Reported on 05/21/2020)   Not Taking at Unknown time   zinc sulfate 220 (50 Zn) MG capsule Take by mouth. (Patient not taking: Reported on 05/21/2020)   Not Taking at Unknown time   Home medication reconciliation was completed  with the patient.   Scheduled Inpatient Medications:    diltiazem  300 mg Oral Daily   [START ON 05/25/2020] levothyroxine  25 mcg Oral Once per day on Tue Thu   [START ON 05/22/2020] levothyroxine  50 mcg  Oral Once per day on Sun Mon Wed Fri Sat   losartan  50 mg Oral Daily   metoprolol tartrate  50 mg Oral BID   oxybutynin  10 mg Oral QHS   pantoprazole (PROTONIX) IV  40 mg Intravenous Q12H    Continuous Inpatient Infusions:    sodium chloride 75 mL/hr at 05/21/20 1346    PRN Inpatient Medications:  acetaminophen, hydrALAZINE, ondansetron (ZOFRAN) IV, polyvinyl alcohol  Family History: family history includes Cancer in her father; Stroke in her mother.  The patient's family history is negative for inflammatory bowel disorders, GI malignancy, or solid organ transplantation.  Social History:   reports that she has never smoked. She has never used smokeless tobacco. She reports that she does not drink alcohol and does not use drugs. The patient denies ETOH, tobacco, or drug use.   Review of Systems: Constitutional: Weight is stable.  Eyes: No changes in vision. ENT: No oral lesions, sore throat.  GI: see HPI.  Heme/Lymph: No easy bruising.  CV: No chest pain.  GU: No hematuria.  Integumentary: No rashes.  Neuro: No headaches.  Psych: No depression/anxiety.  Endocrine: No heat/cold intolerance.  Allergic/Immunologic: No urticaria.  Resp: No cough, SOB.  Musculoskeletal: No joint swelling.    Physical Examination: BP (!) 152/76 (BP Location: Left Arm)    Pulse 90    Temp 97.7 F (36.5 C)    Resp 18    Ht 5\' 5"  (1.651 m)    Wt 71.2 kg    SpO2 98%    BMI 26.13 kg/m  Elderly female in hospital bed, resting comfortable. Daughter bedside assists with history.  Gen: NAD, alert and oriented x 4 HEENT: PEERLA, EOMI, Neck: supple, no JVD or thyromegaly Chest: CTA bilaterally, no wheezes, crackles, or other adventitious sounds CV: RRR, no m/g/c/r Abd: soft, NT, ND, +BS  in all four quadrants; no HSM, guarding, ridigity, or rebound tenderness Ext: no edema, well perfused with 2+ pulses, Skin: no rash or lesions noted Lymph: no LAD  Data: Lab Results  Component Value Date   WBC 7.0 05/21/2020   HGB 11.4 (L) 05/21/2020   HCT 35.5 (L) 05/21/2020   MCV 81.2 05/21/2020   PLT 273 05/21/2020   Recent Labs  Lab 05/21/20 0631 05/21/20 1144  HGB 12.3 11.4*   Lab Results  Component Value Date   NA 140 05/21/2020   K 4.1 05/21/2020   CL 106 05/21/2020   CO2 24 05/21/2020   BUN 28 (H) 05/21/2020   CREATININE 0.97 05/21/2020   Lab Results  Component Value Date   ALT 17 05/21/2020   AST 26 05/21/2020   ALKPHOS 107 05/21/2020   BILITOT 0.6 05/21/2020   Recent Labs  Lab 05/21/20 0631  APTT 41*  INR 1.2   Assessment/Plan:  86 y/o Caucasian female with a PMH of atrial fibrillation on Pradaxa, s/p pacemaker, PVD, Hx of basal cell cancer, and hard of hearing presented to the ED for one-day history of hematochezia  1. Hematochezia - DDx includes self-limiting diverticular bleed, anal outlet etiology, ischemic colitis, nonspecific colitis, IBD, viral/bacterial gastroenteritis, AVMs, malignancy  2. Bilateral lower abdominal cramping - resolved  3. Chronic SMA occlusion - incidental finding on CTA this morning with collaterals noted. Vascular surgery has been consulted and patient is asymptomatic with no need for urgent evaluation  4. Advanced age   26. Hx of atrial fibrillation on Pradaxa  COVID-19: Negative  Recommendations:  1. She is hemodynamically stable with no signs  of significant hemodynamic changes or precipitous drop in hemoglobin. 2. Continue to monitor H&H closely with serial abdominal examinations 3. If evidence of active bleeding, would recommend a tagged RBC scan. If positive, consult vascular surgery for embolization.  4. No need for urgent colonoscopy given no recurrent hematochezia today and stable hemoglobin 5. Observe  overnight. If no evidence of recurrent hematochezia or precipitous drop in hemoglobin, she may be discharged with outpatient follow-up 6. Full liquid diet for now 7. Following along with you  8. Hold Pradaxa. Avoid NSAIDs.  Patient and daughter agree with above plan of care. All of their questions were answered.  Thank you for the consult. Please call with questions or concerns.  Reeves Forth [facility] [phone] (Cell)

## 2020-05-22 DIAGNOSIS — K625 Hemorrhage of anus and rectum: Secondary | ICD-10-CM | POA: Diagnosis not present

## 2020-05-22 LAB — URINALYSIS, ROUTINE W REFLEX MICROSCOPIC
Bacteria, UA: NONE SEEN
Bilirubin Urine: NEGATIVE
Glucose, UA: NEGATIVE mg/dL
Ketones, ur: NEGATIVE mg/dL
Nitrite: NEGATIVE
Protein, ur: 30 mg/dL — AB
RBC / HPF: >50 RBC/hpf — ABNORMAL HIGH (ref 0–5)
Specific Gravity, Urine: 1.016 (ref 1.005–1.030)
WBC, UA: >50 WBC/hpf — ABNORMAL HIGH (ref 0–5)
pH: 5 (ref 5.0–8.0)

## 2020-05-22 LAB — GLUCOSE, CAPILLARY: Glucose-Capillary: 83 mg/dL (ref 70–99)

## 2020-05-22 LAB — CBC
HCT: 34.7 % — ABNORMAL LOW (ref 36.0–46.0)
Hemoglobin: 10.6 g/dL — ABNORMAL LOW (ref 12.0–15.0)
MCH: 25.7 pg — ABNORMAL LOW (ref 26.0–34.0)
MCHC: 30.5 g/dL (ref 30.0–36.0)
MCV: 84.2 fL (ref 80.0–100.0)
Platelets: 281 10*3/uL (ref 150–400)
RBC: 4.12 MIL/uL (ref 3.87–5.11)
RDW: 17.2 % — ABNORMAL HIGH (ref 11.5–15.5)
WBC: 6.9 10*3/uL (ref 4.0–10.5)
nRBC: 0 % (ref 0.0–0.2)

## 2020-05-22 LAB — BASIC METABOLIC PANEL
Anion gap: 7 (ref 5–15)
BUN: 15 mg/dL (ref 8–23)
CO2: 23 mmol/L (ref 22–32)
Calcium: 8.6 mg/dL — ABNORMAL LOW (ref 8.9–10.3)
Chloride: 111 mmol/L (ref 98–111)
Creatinine, Ser: 0.88 mg/dL (ref 0.44–1.00)
GFR calc Af Amer: >60 mL/min (ref >60)
GFR calc non Af Amer: 56 mL/min — ABNORMAL LOW (ref >60)
Glucose, Bld: 85 mg/dL (ref 70–99)
Potassium: 3.9 mmol/L (ref 3.5–5.1)
Sodium: 141 mmol/L (ref 135–145)

## 2020-05-22 LAB — HEMOGLOBIN: Hemoglobin: 11.2 g/dL — ABNORMAL LOW (ref 12.0–15.0)

## 2020-05-22 MED ORDER — SENNA 8.6 MG PO TABS: 1.0000 | ORAL_TABLET | Freq: Every day | ORAL | Status: DC

## 2020-05-22 MED ORDER — CEFDINIR 300 MG PO CAPS
300.0000 mg | ORAL_CAPSULE | Freq: Two times a day (BID) | ORAL | 0 refills | Status: AC
Start: 2020-05-22 — End: 2020-05-25

## 2020-05-22 NOTE — Plan of Care (Signed)

## 2020-05-22 NOTE — Progress Notes (Signed)
GI Inpatient Follow-up Note  Subjective:  Patient seen in follow-up for rectal bleeding. No acute overnight events. Daughter is bedside and helps with history. She has not had a bowel movement overnight or this morning. Hemoglobin 10.6 this morning. She does complain of some back pain which has been chronic in nature. No abdominal pain, nausea, vomiting, fever.   Scheduled Inpatient Medications:  . diltiazem  300 mg Oral Daily  . [START ON 05/25/2020] levothyroxine  25 mcg Oral Once per day on Tue Thu  . levothyroxine  50 mcg Oral Once per day on Sun Mon Wed Fri Sat  . losartan  50 mg Oral Daily  . metoprolol tartrate  50 mg Oral BID  . oxybutynin  10 mg Oral QHS  . pantoprazole (PROTONIX) IV  40 mg Intravenous Q12H    Continuous Inpatient Infusions:   . sodium chloride 75 mL/hr at 05/22/20 0412    PRN Inpatient Medications:  acetaminophen, hydrALAZINE, ondansetron (ZOFRAN) IV, polyvinyl alcohol  Review of Systems: Constitutional: Weight is stable.  Eyes: No changes in vision. ENT: No oral lesions, sore throat.  GI: see HPI.  Heme/Lymph: No easy bruising.  CV: No chest pain.  GU: No hematuria.  Integumentary: No rashes.  Neuro: No headaches.  Psych: No depression/anxiety.  Endocrine: No heat/cold intolerance.  Allergic/Immunologic: No urticaria.  Resp: No cough, SOB.  Musculoskeletal: No joint swelling.    Physical Examination: BP (!) 143/70 (BP Location: Left Arm)   Pulse 65   Temp 97.8 F (36.6 C) (Oral)   Resp 16   Ht 5\' 5"  (1.651 m)   Wt 71.2 kg   SpO2 93%   BMI 26.13 kg/m  Gen: NAD, alert and oriented x 4 HEENT: PEERLA, EOMI, Neck: supple, no JVD or thyromegaly Chest: CTA bilaterally, no wheezes, crackles, or other adventitious sounds CV: RRR, no m/g/c/r Abd: soft, NT, ND, +BS in all four quadrants; no HSM, guarding, ridigity, or rebound tenderness Ext: no edema, well perfused with 2+ pulses, Skin: no rash or lesions noted Lymph: no LAD  Data: Lab  Results  Component Value Date   WBC 6.9 05/22/2020   HGB 10.6 (L) 05/22/2020   HCT 34.7 (L) 05/22/2020   MCV 84.2 05/22/2020   PLT 281 05/22/2020   Recent Labs  Lab 05/21/20 1547 05/21/20 2127 05/22/20 0348  HGB 10.8* 10.5* 10.6*   Lab Results  Component Value Date   NA 141 05/22/2020   K 3.9 05/22/2020   CL 111 05/22/2020   CO2 23 05/22/2020   BUN 15 05/22/2020   CREATININE 0.88 05/22/2020   Lab Results  Component Value Date   ALT 17 05/21/2020   AST 26 05/21/2020   ALKPHOS 107 05/21/2020   BILITOT 0.6 05/21/2020   Recent Labs  Lab 05/21/20 0631  APTT 41*  INR 1.2   Assessment/Plan:  86 y/o Caucasian female with a PMH of atrial fibrillation on Pradaxa, s/p pacemaker, PVD, Hx of basal cell cancer, and hard of hearing presented to the ED for one-day history of hematochezia  1. Hematochezia - DDx includes self-limiting diverticular bleed, anal outlet etiology, ischemic colitis, nonspecific colitis, IBD, viral/bacterial gastroenteritis, AVMs, malignancy  2. Bilateral lower abdominal cramping - resolved  3. Chronic SMA occlusion - incidental finding on CTA this morning with collaterals noted. Vascular surgery has been consulted and patient is asymptomatic with no need for urgent evaluation  4. Advanced age   86. Hx of atrial fibrillation on Pradaxa - Hold Pradaxa   COVID-19: Negative  Recommendations:  1. NO signs of recurrent hematochezia or other GI blood loss. She is hemodynamically stable. Hemoglobin 10.6 this morning with no signs of precipitous drop in hemoglobin 2. Advance diet as tolerated. Would like for her to have a bowel movement prior to discharge.  3. Stable for discharge from a GI standpoint. No plans for colonoscopy 4. She should follow-up with her PCP in 1 week with a repeat CBC  Please call with questions or concerns.   Octavia Bruckner, PA-C [facility] [phone] (Cell)

## 2020-05-22 NOTE — Discharge Instructions (Signed)
Bloody Diarrhea Bloody diarrhea is frequent loose and watery bowel movements that contain blood. The blood can be hard to see or notice (occult). Bloody diarrhea may be caused by medical conditions such as:  Ulcerative colitis.  Crohn's disease.  Intestinal infection.  Viral gastroenteritis or bacterial gastroenteritis. Finding out why there is blood in your diarrhea is necessary so that your health care provider can prescribe the right treatment for you. Follow the instructions from your health care provider about treating the cause of your bloody diarrhea. Any type of diarrhea can make you feel weak and dehydrated. Dehydration can make you tired and thirsty, cause you to have a dry mouth, and decrease how often you urinate. Follow these instructions at home: Eating and drinking     Follow these recommendations as told by your health care provider:  Take an oral rehydration solution (ORS). This is an over-the-counter medicine that helps return your body to its normal balance of nutrients and water. It is found at pharmacies and retail stores.  Drink enough fluid to keep your urine pale yellow. ? Drink fluids such as water, ice chips, diluted fruit juice, and low-calorie sports drinks. You can also drink milk products, if desired. ? Avoid drinking fluids that contain a lot of sugar or caffeine, such as energy drinks, regular sports drinks, and soda. ? Avoid alcohol.  Eat bland, easy-to-digest foods in small amounts as you are able. These foods include bananas, applesauce, rice, lean meats, toast, and crackers.  Avoid spicy or fatty foods.  Medicines  Take over-the-counter and prescription medicines only as told by your health care provider. ? Your health care provider may prescribe medicine to slow down the frequency of diarrhea or to ease stomach discomfort.  If you were prescribed an antibiotic medicine, take it as told by your health care provider. Do not stop using the  antibiotic even if you start to feel better. General instructions   Wash your hands often using soap and water. If soap and water are not available, use a hand sanitizer. Others in the household should wash their hands as well. Hands should be washed: ? After using the toilet or changing a diaper. ? Before preparing, cooking, or serving food. ? While caring for a sick person or while visiting someone in a hospital.  Rest at home while you recover.  Take a warm bath to relieve any burning or pain from frequent diarrhea episodes.  Watch your condition for any changes.  Keep all follow-up visits as told by your health care provider. This is important. Contact a health care provider if:  You have a fever.  Your diarrhea gets worse.  You have new symptoms.  You cannot keep fluids down.  You feel light-headed or dizzy.  You have a headache.  You have muscle cramps. Get help right away if:  You have chest pain.  You feel extremely weak or you faint.  The blood in your diarrhea increases or turns a different color.  You vomit and the vomit is bloody or looks black.  You have persistent diarrhea.  You have severe pain, cramping, or bloating in your abdomen.  You have trouble breathing or you are breathing very quickly.  Your heart is beating very quickly.  Your skin feels cold and clammy.  You feel confused.  You have signs of dehydration, such as: ? Dark urine, very little urine, or no urine. ? Cracked lips. ? Dry mouth. ? Sunken eyes. ? Sleepiness. ? Weakness. Summary  Bloody diarrhea is frequent loose and watery bowel movements that contain blood. The blood can be hard to see or notice (occult).  Follow the instructions from your health care provider about treating the cause of your bloody diarrhea.  Any type of diarrhea can make you feel weak and dehydrated.  Follow your health care provider's recommendations for eating and drinking and for taking  medicines.  Contact your health care provider if your symptoms get worse. Get help right away if you have signs of dehydration. This information is not intended to replace advice given to you by your health care provider. Make sure you discuss any questions you have with your health care provider. Document Revised: 05/02/2018 Document Reviewed: 05/02/2018 Elsevier Patient Education  2020 [employer]  

## 2020-05-22 NOTE — Plan of Care (Signed)
  Problem: Education: Goal: Knowledge of General Education information will improve Description: Including pain rating scale, medication(s)/side effects and non-pharmacologic comfort measures 05/22/2020 1412 by Ferrel Logan, RN Outcome: Adequate for Discharge 05/22/2020 1411 by Ferrel Logan, RN Outcome: Progressing   Problem: Health Behavior/Discharge Planning: Goal: Ability to manage health-related needs will improve 05/22/2020 1412 by Ferrel Logan, RN Outcome: Adequate for Discharge 05/22/2020 1411 by Ferrel Logan, RN Outcome: Progressing   Problem: Clinical Measurements: Goal: Ability to maintain clinical measurements within normal limits will improve 05/22/2020 1412 by Ferrel Logan, RN Outcome: Adequate for Discharge 05/22/2020 1411 by Ferrel Logan, RN Outcome: Progressing Goal: Will remain free from infection 05/22/2020 1412 by Ferrel Logan, RN Outcome: Adequate for Discharge 05/22/2020 1411 by Ferrel Logan, RN Outcome: Progressing Goal: Diagnostic test results will improve 05/22/2020 1412 by Ferrel Logan, RN Outcome: Adequate for Discharge 05/22/2020 1411 by Ferrel Logan, RN Outcome: Progressing Goal: Respiratory complications will improve 05/22/2020 1412 by Ferrel Logan, RN Outcome: Adequate for Discharge 05/22/2020 1411 by Ferrel Logan, RN Outcome: Progressing Goal: Cardiovascular complication will be avoided 05/22/2020 1412 by Ferrel Logan, RN Outcome: Adequate for Discharge 05/22/2020 1411 by Ferrel Logan, RN Outcome: Progressing   Problem: Activity: Goal: Risk for activity intolerance will decrease 05/22/2020 1412 by Ferrel Logan, RN Outcome: Adequate for Discharge 05/22/2020 1411 by Ferrel Logan, RN Outcome: Progressing   Problem: Nutrition: Goal: Adequate nutrition will be maintained 05/22/2020 1412 by Ferrel Logan, RN Outcome: Adequate for Discharge 05/22/2020 1411 by Ferrel Logan, RN Outcome: Progressing   Problem:  Coping: Goal: Level of anxiety will decrease 05/22/2020 1412 by Ferrel Logan, RN Outcome: Adequate for Discharge 05/22/2020 1411 by Ferrel Logan, RN Outcome: Progressing   Problem: Elimination: Goal: Will not experience complications related to bowel motility 05/22/2020 1412 by Ferrel Logan, RN Outcome: Adequate for Discharge 05/22/2020 1411 by Ferrel Logan, RN Outcome: Progressing Goal: Will not experience complications related to urinary retention 05/22/2020 1412 by Ferrel Logan, RN Outcome: Adequate for Discharge 05/22/2020 1411 by Ferrel Logan, RN Outcome: Progressing   Problem: Pain Managment: Goal: General experience of comfort will improve 05/22/2020 1412 by Ferrel Logan, RN Outcome: Adequate for Discharge 05/22/2020 1411 by Ferrel Logan, RN Outcome: Progressing   Problem: Safety: Goal: Ability to remain free from injury will improve 05/22/2020 1412 by Ferrel Logan, RN Outcome: Adequate for Discharge 05/22/2020 1411 by Ferrel Logan, RN Outcome: Progressing   Problem: Skin Integrity: Goal: Risk for impaired skin integrity will decrease 05/22/2020 1412 by Ferrel Logan, RN Outcome: Adequate for Discharge 05/22/2020 1411 by Ferrel Logan, RN Outcome: Progressing

## 2020-05-22 NOTE — Discharge Summary (Addendum)
Physician Discharge Summary  [name] CXK:[id number] DOB: [birth year] DOA: 05/21/2020  PCP: Leonel Ramsay, MD  Admit date: 05/21/2020 Discharge date: 05/22/2020  Admitted From: Home Disposition: Home  Recommendations for Outpatient Follow-up:  1. Follow up with PCP in 1 weeks 2. Please obtain BMP/CBC in one week 3. Please follow up with Dr. Saralyn Pilar as directed by his office  Home Health:No Equipment/Devices:None Discharge Condition:Stable CODE STATUS:Full Diet recommendation: Heart Healthy Brief/Interim Summary:  HPI: [name] is a 86 y.o. female with medical history significant of hypertension, hyperlipidemia, stroke, GERD, hypothyroidism, pacemaker placement, PVD, vertigo, hard of hearing, atrial fibrillation on Pradaxa, anemia, varicose veins in the legs, who presents with rectal bleeding.  Patient states that she has had 3-4 times of rectal bleeding yesterday and 2 small rectal bleeding today, with bright red blood. Denies nausea and vomiting.  Patient has mild diffuse cramping abdominal pain.  No chest pain, shortness breath, cough, fever or chills.  Denies dizziness or lightheadedness.  No unilateral weakness.  6/19: Patient seen and examined.  Hemoglobin stable.  Had a bowel movement without any blood in it.  Stable for discharge.  I can discussed with patient's primary cardiologist.  Now we are recommending at this point the patient stop her Pradaxa until either her bleeding stops or she is seen by cardiology in the office.  Discussed plan of care with patient's daughter.  She expressed understanding.  All questions were answered.  After bowel movement and confirmation of hemoglobin stability patient was discharged home.  Update; repeat UA concerning for infection.  Considering symptoms, will treat empirically with cefdinir 300mg  BID x 3 days.  Follow up outpatient PCP.  Discharge Diagnoses:  Principal Problem:   Rectal bleeding Active Problems:    Acquired hypothyroidism   AF (paroxysmal atrial fibrillation) (HCC)   GERD (gastroesophageal reflux disease)   Stroke (HCC)   HTN (hypertension)   Occlusion of superior mesenteric artery (HCC)  Rectal bleeding: Patient has never had a colonoscopy before.   Hemoglobin 12.3 -->11.4.  Hemodynamically stable currently.  CT angiogram showed chronic occlusion of SMA.   Dr. Lucky Cowboy of [facility] and Dr. Alice Reichert of GI are consulted. No GI or vascular intervention indicated at this time Pradaxa held Patient had bowel movement that was nonbloody Hemoglobin remained relatively stable Discussed holding patient's Pradaxa on discharge Patient's established outpatient cardiologist is aware Will recommend she hold on discharge with instructions to follow-up with Dr. Henrene Pastor shows at the next visit where he will discuss possibly restarting this medication  Acquired hypothyroidism -Synthroid  AF (paroxysmal atrial fibrillation) ([place]) -Hold Pradaxa -Continue metoprolol and diltiazem  GERD (gastroesophageal reflux disease) Resume home omeprazole  Stroke ([place]) -Hold aspirin and Pradaxa due to GI bleeding  HTN (hypertension) Resume home regimen  Discharge Instructions  Discharge Instructions    Diet - low sodium heart healthy   Complete by: As directed    Increase activity slowly   Complete by: As directed      Allergies as of 05/22/2020      Reactions   Chlorpheniramine    Other reaction(s): Unknown Unsure who ordered this med or what it was for. `   Fluconazole Rash   Gabapentin Rash   Metronidazole Nausea And Vomiting, Other (See Comments), Rash   Monistat  [tioconazole] Rash, Other (See Comments)   Sulfa Antibiotics Rash, Nausea And Vomiting      Medication List    STOP taking these medications   aspirin EC 81 MG tablet  Biotin 1000 MCG tablet   calcium carbonate 1500 (600 Ca) MG Tabs tablet Commonly known as: OSCAL   Cartia XT 300 MG 24 hr capsule Generic drug: diltiazem    cyanocobalamin 1000 MCG tablet   dabigatran 150 MG Caps capsule Commonly known as: PRADAXA   erythromycin ophthalmic ointment   Glucosamine 500 MG Caps   Magnesium 250 MG Tabs   Maxitrol 0.1 % Oint Generic drug: neomycin-polymyxin-dexameth   meloxicam 7.5 MG tablet Commonly known as: MOBIC   multivitamin tablet   PRESERVISION AREDS 2+MULTI VIT PO   PRESERVISION/LUTEIN PO   RED YEAST RICE PO   traMADol 50 MG tablet Commonly known as: Ultram   vitamin A 10000 UNIT capsule   vitamin C 500 MG tablet Commonly known as: ASCORBIC ACID   Vitamin D 50 MCG (2000 UT) Caps   zinc sulfate 220 (50 Zn) MG capsule     TAKE these medications   acetaminophen 650 MG CR tablet Commonly known as: TYLENOL Take 650 mg by mouth every 8 (eight) hours as needed for pain.   ARTIFICIAL TEARS OP Apply to eye as needed.   cefdinir 300 MG capsule Commonly known as: OMNICEF Take 1 capsule (300 mg total) by mouth 2 (two) times daily for 3 days.   diltiazem 300 MG 24 hr capsule Commonly known as: TIAZAC Take 300 mg by mouth daily.   furosemide 20 MG tablet Commonly known as: LASIX Take 20 mg by mouth daily.   levothyroxine 25 MCG tablet Commonly known as: SYNTHROID Take 25 mcg by mouth daily before breakfast. Administer on  Tuesday and thursdays only   levothyroxine 50 MCG tablet Commonly known as: SYNTHROID Take 50 mcg by mouth daily before breakfast. Everyday except Tuesday and thursday   losartan 50 MG tablet Commonly known as: COZAAR Take 50 mg by mouth daily.   metoprolol tartrate 50 MG tablet Commonly known as: LOPRESSOR Take 50 mg by mouth 2 (two) times daily.   mupirocin ointment 2 % Commonly known as: Patterson 1 application into the nose as needed.   NONFORMULARY OR COMPOUNDED ITEM See pharmacy note   nystatin-triamcinolone cream Commonly known as: MYCOLOG II Apply 1 application topically 2 (two) times daily as needed.   omeprazole 40 MG  capsule Commonly known as: PRILOSEC Take 40 mg by mouth daily.   potassium chloride SA 20 MEQ tablet Commonly known as: KLOR-CON Take 20 mEq by mouth daily.   tolterodine 2 MG tablet Commonly known as: DETROL Take 2 mg by mouth daily as needed (bladder control).       Follow-up Information    Leonel Ramsay, MD. Schedule an appointment as soon as possible for a visit in 1 week(s).   Specialty: Infectious Diseases Contact information: [place] [address] [phone]        Isaias Cowman, MD Follow up.   Specialty: Cardiology Why: Follow up as directed by Dr. Saralyn Pilar.  We have spoken about stopping the pradaxa.  He is aware and will discuss further with you at the next visit Contact information: [facility] [place] [address] [phone]              Allergies  Allergen Reactions  . Chlorpheniramine     Other reaction(s): Unknown Unsure who ordered this med or what it was for. `  . Fluconazole Rash  . Gabapentin Rash  . Metronidazole Nausea And Vomiting, Other (See Comments) and Rash  . Monistat  [  Tioconazole] Rash and Other (See Comments)  . Sulfa Antibiotics Rash and Nausea And Vomiting    Consultations:  GI  Vascular   Procedures/Studies: CT Angio Abd/Pel W and/or Wo Contrast  Result Date: 05/21/2020 CLINICAL DATA:  Rectal bleeding, melena, abdominal pain, diarrhea and fecal incontinence. EXAM: CT ANGIOGRAPHY ABDOMEN AND PELVIS WITH CONTRAST AND WITHOUT CONTRAST TECHNIQUE: Multidetector CT imaging of the abdomen and pelvis was performed using the standard protocol during bolus administration of intravenous contrast. Multiplanar reconstructed images and MIPs were obtained and reviewed to evaluate the vascular anatomy. CONTRAST:  33mL OMNIPAQUE IOHEXOL 350 MG/ML SOLN COMPARISON:  Prior CT of the abdomen and pelvis without contrast on 03/22/2012 and CT of the chest without  contrast on 07/13/2016 FINDINGS: VASCULAR Aorta: Tortuous aorta containing calcified plaque without evidence of aneurysm or dissection. Celiac: Calcified plaque at the origin of the celiac axis without significant stenosis. There appear to be well developed collaterals via the pancreaticoduodenal arcade supplied by the gastroduodenal artery and reconstituting the SMA trunk and SMA branches. SMA: The proximal trunk of the SMA is likely completely occluded or essentially completely occluded by a large amount of heavily calcified plaque beginning at the origin and continuing for approximately a 2.5 cm segment into the proximal trunk. The trunk is atretic distal to the calcified plaque supporting complete occlusion and suddenly becomes reconstituted by visible collateral supply from the GDA and pancreaticoduodenal arcade. Reconstituted distal branches appear patent. Renals: Single renal arteries bilaterally. Calcified and noncalcified plaque in the proximal right renal artery causes likely significant stenosis of 70% or greater. Calcified plaque at the origin of a left renal artery that demonstrates almost immediate bifurcation. No evidence of significant left renal artery stenosis. IMA: Patent and enlarged IMA with visible collateral supply to SMA branches. This finding also supports likely chronic proximal SMA occlusion. Inflow: Calcified atherosclerosis without evidence of aneurysmal disease or significant obstructive disease of the iliac arteries bilaterally. Proximal Outflow: Common femoral arteries show scattered plaque bilaterally without significant stenosis. Femoral bifurcations are patent bilaterally. Veins: Venous phase imaging demonstrates no evidence of varices or venous occlusions. The portal vein, visualized mesenteric veins and splenic vein are normally patent. Review of the MIP images confirms the above findings. NON-VASCULAR Lower chest: Small to moderate-sized hiatal hernia. Bibasilar pulmonary  scarring. Hepatobiliary: No focal liver abnormality is seen. Status post cholecystectomy. Mild biliary dilatation of extrahepatic bile ducts is likely due to prior cholecystectomy. The common hepatic and common bile ducts measure up to 9-11 mm. No obvious calculi in the bile ducts. Pancreas: Unremarkable. No pancreatic ductal dilatation or surrounding inflammatory changes. Spleen: Normal spleen size. Stable low-density lesion in the inferior spleen is consistent with cyst or hemangioma and measures approximately 1.3 cm. Adrenals/Urinary Tract: Adrenal glands are unremarkable. Kidneys are normal, without renal calculi, focal lesion, or hydronephrosis. Bladder is unremarkable. Stomach/Bowel: Bowel shows no evidence of obstruction, ileus, inflammation or visible lesion without oral contrast. No free air. Unenhanced, arterial phase and venous phase imaging demonstrates no evidence of active bleeding or contrast extravasation into the gastrointestinal tract. Lymphatic: No enlarged abdominal or pelvic lymph nodes. Reproductive: Small postmenopausal uterus. No adnexal masses identified. Other: No ascites or focal fluid collections identified. Small right inguinal hernia contains a short segment of distal small bowel without evidence of incarceration. Small left inguinal hernia contains fat. Musculoskeletal: Degenerative disc disease with associated degenerative scoliosis of the thoracolumbar and lumbar spines. No evidence of fracture or bony lesion. IMPRESSION: 1. Likely chronic occlusion or near complete occlusion  of the proximal trunk of the SMA by a large amount of heavily calcified plaque beginning at the origin and continuing for approximately a 2.5 cm segment into the proximal trunk. The trunk is atretic distal to the calcified plaque supporting complete occlusion and suddenly becomes reconstituted by visible prominent collateral supply from the GDA and pancreaticoduodenal arcade. This also supports likely chronic  proximal SMA occlusion. 2. The IMA is also enlarged with visible collateral supply to SMA branches supporting chronic SMA occlusion. 3. No visible acute bowel pathology or active bleeding into the gastrointestinal tract by CTA. 4. Probable significant stenosis of the proximal right renal artery of 70% or greater. 5. Small to moderate-sized hiatal hernia. 6. Small right inguinal hernia containing a short segment of distal small bowel without evidence of incarceration. Small left inguinal hernia containing fat. 7. Mild biliary dilatation is likely due to prior cholecystectomy. The common hepatic and common bile ducts measure up to 9-11 mm. No obvious calculi in the bile ducts. Electronically Signed   By: Aletta Edouard M.D.   On: 05/21/2020 08:45   (Echo, Carotid, EGD, Colonoscopy, ERCP)    Subjective: Patient seen and examined on day of discharge.  Hemodynamically stable.  Hemoglobin stable.  Daughter at bedside.  Patient stable for discharge home.  Discharge Exam: Vitals:   05/22/20 0801 05/22/20 1231  BP: (!) 143/70 117/68  Pulse: 65 72  Resp: 16 16  Temp: 97.8 F (36.6 C) 98.3 F (36.8 C)  SpO2: 93% 95%   Vitals:   05/22/20 0049 05/22/20 0520 05/22/20 0801 05/22/20 1231  BP: (!) 105/52 (!) 154/65 (!) 143/70 117/68  Pulse: 65 69 65 72  Resp: 16 16 16 16   Temp: 98.1 F (36.7 C) 97.8 F (36.6 C) 97.8 F (36.6 C) 98.3 F (36.8 C)  TempSrc: Oral Oral Oral Oral  SpO2: 94% 95% 93% 95%  Weight:      Height:        General: Pt is alert, awake, not in acute distress Cardiovascular: RRR, S1/S2 +, no rubs, no gallops Respiratory: CTA bilaterally, no wheezing, no rhonchi Abdominal: Soft, NT, ND, bowel sounds + Extremities: no edema, no cyanosis    The results of significant diagnostics from this hospitalization (including imaging, microbiology, ancillary and laboratory) are listed below for reference.     Microbiology: Recent Results (from the past 240 hour(s))  SARS Coronavirus  2 by RT PCR (hospital order, performed in [facility] hospital lab) Nasopharyngeal Nasopharyngeal Swab     Status: None   Collection Time: 05/21/20  7:23 AM   Specimen: Nasopharyngeal Swab  Result Value Ref Range Status   SARS Coronavirus 2 NEGATIVE NEGATIVE Final    Comment: (NOTE) SARS-CoV-2 target nucleic acids are NOT DETECTED.  The SARS-CoV-2 RNA is generally detectable in upper and lower respiratory specimens during the acute phase of infection. The lowest concentration of SARS-CoV-2 viral copies this assay can detect is 250 copies / mL. A negative result does not preclude SARS-CoV-2 infection and should not be used as the sole basis for treatment or other patient management decisions.  A negative result may occur with improper specimen collection / handling, submission of specimen other than nasopharyngeal swab, presence of viral mutation(s) within the areas targeted by this assay, and inadequate number of viral copies (<250 copies / mL). A negative result must be combined with clinical observations, patient history, and epidemiological information.  Fact Sheet for Patients:   StrictlyIdeas.no  Fact Sheet for Healthcare Providers: BankingDealers.co.za  This  test is not yet approved or  cleared by the [place] and has been authorized for detection and/or diagnosis of SARS-CoV-2 by FDA under an Emergency Use Authorization (EUA).  This EUA will remain in effect (meaning this test can be used) for the duration of the COVID-19 declaration under Section 564(b)(1) of the Act, 21 U.S.C. section 360bbb-3(b)(1), unless the authorization is terminated or revoked sooner.  Performed at [facility], [address] [place], [place] [address]      Labs: BNP (last 3 results) Recent Labs    05/21/20 0642  BNP 856.3*   Basic Metabolic Panel: Recent Labs  Lab 05/21/20 0631 05/22/20 0348  NA 140 141  K 4.1 3.9   CL 106 111  CO2 24 23  GLUCOSE 103* 85  BUN 28* 15  CREATININE 0.97 0.88  CALCIUM 9.0 8.6*   Liver Function Tests: Recent Labs  Lab 05/21/20 0631  AST 26  ALT 17  ALKPHOS 107  BILITOT 0.6  PROT 8.0  ALBUMIN 3.8   Recent Labs  Lab 05/21/20 0631  LIPASE 39   No results for input(s): AMMONIA in the last 168 hours. CBC: Recent Labs  Lab 05/21/20 0631 05/21/20 0631 05/21/20 1144 05/21/20 1547 05/21/20 2127 05/22/20 0348 05/22/20 1234  WBC 8.3  --  7.0 7.6 8.1 6.9  --   NEUTROABS 4.6  --   --   --   --   --   --   HGB 12.3   < > 11.4* 10.8* 10.5* 10.6* 11.2*  HCT 40.2  --  35.5* 33.2* 34.7* 34.7*  --   MCV 84.5  --  81.2 81.0 84.8 84.2  --   PLT 296  --  273 272 275 281  --    < > = values in this interval not displayed.   Cardiac Enzymes: No results for input(s): CKTOTAL, CKMB, CKMBINDEX, TROPONINI in the last 168 hours. BNP: Invalid input(s): POCBNP CBG: Recent Labs  Lab 05/22/20 0757  GLUCAP 83   D-Dimer No results for input(s): DDIMER in the last 72 hours. Hgb A1c No results for input(s): HGBA1C in the last 72 hours. Lipid Profile No results for input(s): CHOL, HDL, LDLCALC, TRIG, CHOLHDL, LDLDIRECT in the last 72 hours. Thyroid function studies No results for input(s): TSH, T4TOTAL, T3FREE, THYROIDAB in the last 72 hours.  Invalid input(s): FREET3 Anemia work up No results for input(s): VITAMINB12, FOLATE, FERRITIN, TIBC, IRON, RETICCTPCT in the last 72 hours. Urinalysis    Component Value Date/Time   COLORURINE YELLOW (A) 05/22/2020 1250   APPEARANCEUR CLOUDY (A) 05/22/2020 1250   APPEARANCEUR Clear 08/30/2012 1755   LABSPEC 1.016 05/22/2020 1250   LABSPEC 1.008 08/30/2012 1755   PHURINE 5.0 05/22/2020 1250   GLUCOSEU NEGATIVE 05/22/2020 1250   GLUCOSEU Negative 08/30/2012 1755   HGBUR LARGE (A) 05/22/2020 1250   BILIRUBINUR NEGATIVE 05/22/2020 1250   BILIRUBINUR Negative 08/30/2012 1755   KETONESUR NEGATIVE 05/22/2020 1250   PROTEINUR  30 (A) 05/22/2020 1250   NITRITE NEGATIVE 05/22/2020 1250   LEUKOCYTESUR LARGE (A) 05/22/2020 1250   LEUKOCYTESUR Negative 08/30/2012 1755   Sepsis Labs Invalid input(s): PROCALCITONIN,  WBC,  LACTICIDVEN Microbiology Recent Results (from the past 240 hour(s))  SARS Coronavirus 2 by RT PCR (hospital order, performed in [facility] hospital lab) Nasopharyngeal Nasopharyngeal Swab     Status: None   Collection Time: 05/21/20  7:23 AM   Specimen: Nasopharyngeal Swab  Result Value Ref Range Status   SARS  Coronavirus 2 NEGATIVE NEGATIVE Final    Comment: (NOTE) SARS-CoV-2 target nucleic acids are NOT DETECTED.  The SARS-CoV-2 RNA is generally detectable in upper and lower respiratory specimens during the acute phase of infection. The lowest concentration of SARS-CoV-2 viral copies this assay can detect is 250 copies / mL. A negative result does not preclude SARS-CoV-2 infection and should not be used as the sole basis for treatment or other patient management decisions.  A negative result may occur with improper specimen collection / handling, submission of specimen other than nasopharyngeal swab, presence of viral mutation(s) within the areas targeted by this assay, and inadequate number of viral copies (<250 copies / mL). A negative result must be combined with clinical observations, patient history, and epidemiological information.  Fact Sheet for Patients:   StrictlyIdeas.no  Fact Sheet for Healthcare Providers: BankingDealers.co.za  This test is not yet approved or  cleared by the [place] FDA and has been authorized for detection and/or diagnosis of SARS-CoV-2 by FDA under an Emergency Use Authorization (EUA).  This EUA will remain in effect (meaning this test can be used) for the duration of the COVID-19 declaration under Section 564(b)(1) of the Act, 21 U.S.C. section 360bbb-3(b)(1), unless the authorization is terminated  or revoked sooner.  Performed at [facility], [address], [place], [place] [address]      Time coordinating discharge: Over 30 minutes  SIGNED:   Sidney Ace, MD  [facility] 05/22/2020, 2:06 PM Pager   If 7PM-7AM, please contact night-coverage

## 2020-09-08 ENCOUNTER — Other Ambulatory Visit: Payer: Self-pay | Admitting: Physical Medicine & Rehabilitation

## 2020-09-08 DIAGNOSIS — M5414 Radiculopathy, thoracic region: Secondary | ICD-10-CM

## 2020-09-08 DIAGNOSIS — M5416 Radiculopathy, lumbar region: Secondary | ICD-10-CM

## 2020-09-24 ENCOUNTER — Other Ambulatory Visit: Payer: Self-pay | Admitting: Physical Medicine & Rehabilitation

## 2020-09-24 DIAGNOSIS — M5414 Radiculopathy, thoracic region: Secondary | ICD-10-CM

## 2020-09-24 DIAGNOSIS — G8929 Other chronic pain: Secondary | ICD-10-CM

## 2020-09-24 DIAGNOSIS — M546 Pain in thoracic spine: Secondary | ICD-10-CM

## 2020-09-28 ENCOUNTER — Telehealth: Payer: Self-pay | Admitting: Emergency Medicine

## 2020-09-30 ENCOUNTER — Ambulatory Visit: Payer: Medicare Other

## 2020-09-30 ENCOUNTER — Ambulatory Visit (HOSPITAL_COMMUNITY): Payer: Medicare Other

## 2020-09-30 ENCOUNTER — Ambulatory Visit
Admission: RE | Admit: 2020-09-30 | Discharge: 2020-09-30 | Disposition: A | Discharge status: Home / Self Care | Payer: Medicare Other | Source: Ambulatory Visit | Attending: Physical Medicine & Rehabilitation | Admitting: Physical Medicine & Rehabilitation

## 2020-10-04 ENCOUNTER — Ambulatory Visit
Admission: RE | Admit: 2020-10-04 | Discharge: 2020-10-04 | Disposition: A | Discharge status: Home / Self Care | Payer: Medicare Other | Source: Ambulatory Visit | Attending: Physical Medicine & Rehabilitation | Admitting: Physical Medicine & Rehabilitation

## 2020-10-04 ENCOUNTER — Other Ambulatory Visit: Payer: Self-pay

## 2020-10-04 DIAGNOSIS — M5414 Radiculopathy, thoracic region: Secondary | ICD-10-CM

## 2020-10-04 DIAGNOSIS — M5442 Lumbago with sciatica, left side: Secondary | ICD-10-CM | POA: Diagnosis not present

## 2020-10-04 DIAGNOSIS — M546 Pain in thoracic spine: Secondary | ICD-10-CM | POA: Diagnosis not present

## 2020-10-04 DIAGNOSIS — G8929 Other chronic pain: Secondary | ICD-10-CM

## 2020-10-04 DIAGNOSIS — M5441 Lumbago with sciatica, right side: Secondary | ICD-10-CM | POA: Insufficient documentation

## 2020-10-04 LAB — CBC
HCT: 40 % (ref 36.0–46.0)
Hemoglobin: 12.2 g/dL (ref 12.0–15.0)
MCH: 26.5 pg (ref 26.0–34.0)
MCHC: 30.5 g/dL (ref 30.0–36.0)
MCV: 87 fL (ref 80.0–100.0)
Platelets: 287 10*3/uL (ref 150–400)
RBC: 4.6 MIL/uL (ref 3.87–5.11)
RDW: 17.8 % — ABNORMAL HIGH (ref 11.5–15.5)
WBC: 9.1 10*3/uL (ref 4.0–10.5)
nRBC: 0 % (ref 0.0–0.2)

## 2020-10-04 LAB — APTT: aPTT: 27 seconds (ref 24–36)

## 2020-10-04 LAB — PROTIME-INR
INR: 1 (ref 0.8–1.2)
Prothrombin Time: 12.7 seconds (ref 11.4–15.2)

## 2020-10-04 MED ORDER — LIDOCAINE HCL (PF) 1 % IJ SOLN
10.0000 mL | Freq: Once | INTRAMUSCULAR | Status: AC
  Administered 2020-10-04: 10 mL
  Filled 2020-10-04: qty 10

## 2020-10-04 MED ORDER — IOHEXOL 300 MG/ML  SOLN
10.0000 mL | Freq: Once | INTRAMUSCULAR | Status: AC | PRN
  Administered 2020-10-04: 10 mL

## 2020-10-04 MED ORDER — ACETAMINOPHEN 500 MG PO TABS: 1000.0000 mg | ORAL_TABLET | Freq: Four times a day (QID) | ORAL | Status: DC | PRN

## 2020-10-04 NOTE — Progress Notes (Signed)
Labs WNL. To Radiology  Via stretcher for Myelogram with daughter at bedside.

## 2021-02-15 ENCOUNTER — Other Ambulatory Visit (HOSPITAL_COMMUNITY): Payer: Self-pay | Admitting: Family Medicine

## 2021-02-15 DIAGNOSIS — R1032 Left lower quadrant pain: Secondary | ICD-10-CM

## 2021-02-16 ENCOUNTER — Ambulatory Visit
Admission: RE | Admit: 2021-02-16 | Discharge: 2021-02-16 | Disposition: A | Discharge status: Home / Self Care | Payer: Medicare Other | Source: Ambulatory Visit | Attending: Family Medicine | Admitting: Family Medicine

## 2021-02-16 ENCOUNTER — Other Ambulatory Visit: Payer: Self-pay

## 2021-02-16 DIAGNOSIS — R1032 Left lower quadrant pain: Secondary | ICD-10-CM | POA: Insufficient documentation

## 2021-02-16 MED ORDER — IOHEXOL 300 MG/ML  SOLN
100.0000 mL | Freq: Once | INTRAMUSCULAR | Status: AC | PRN
  Administered 2021-02-16: 80 mL via INTRAVENOUS

## 2021-02-18 ENCOUNTER — Other Ambulatory Visit: Payer: Self-pay | Admitting: Infectious Diseases

## 2021-02-18 DIAGNOSIS — N6310 Unspecified lump in the right breast, unspecified quadrant: Secondary | ICD-10-CM

## 2021-02-28 ENCOUNTER — Other Ambulatory Visit: Payer: Self-pay

## 2021-02-28 ENCOUNTER — Ambulatory Visit (INDEPENDENT_AMBULATORY_CARE_PROVIDER_SITE_OTHER): Payer: Medicare Other | Admitting: Podiatry

## 2021-02-28 ENCOUNTER — Encounter: Payer: Self-pay | Admitting: Podiatry

## 2021-02-28 DIAGNOSIS — I739 Peripheral vascular disease, unspecified: Secondary | ICD-10-CM | POA: Diagnosis not present

## 2021-02-28 DIAGNOSIS — M79676 Pain in unspecified toe(s): Secondary | ICD-10-CM | POA: Diagnosis not present

## 2021-02-28 DIAGNOSIS — B351 Tinea unguium: Secondary | ICD-10-CM | POA: Diagnosis not present

## 2021-02-28 NOTE — Progress Notes (Addendum)
This patient returns to my office for at risk foot care.  This patient requires this care by a professional since this patient will be at risk due to having PVD and coagulation defect.  Patient is taking eliquis..  This patient is unable to cut nails herself since the patient cannot reach her nails.These nails are painful walking and wearing shoes. Patient presents to the office with her daughter. This patient presents for at risk foot care today.  General Appearance  Alert, conversant and in no acute stress.  Vascular  Dorsalis pedis and posterior tibial  pulses are weakly  palpable  bilaterally.  Capillary return is within normal limits  bilaterally. Cold feet.  Bilaterally. Absent hair noted.  Neurologic  Senn-Weinstein monofilament wire test within normal limits  bilaterally. Muscle power within normal limits bilaterally.  Nails Thick disfigured discolored nails with subungual debris  from hallux to fifth toes bilaterally. No evidence of bacterial infection or drainage bilaterally.  Orthopedic  No limitations of motion  feet .  No crepitus or effusions noted.  No bony pathology.  Medial deviation of 3,4 and 5 right foot.  No pain or swelling noted.  Skin  normotropic skin with no porokeratosis noted bilaterally.  No signs of infections or ulcers noted.     Onychomycosis  Pain in right toes  Pain in left toes  Consent was obtained for treatment procedures.   Mechanical debridement of nails 1-5  bilaterally performed with a nail nipper.  Filed with dremel without incident.    Return office visit    prn                 Told patient to return for periodic foot care and evaluation due to potential at risk complications.   Gardiner Barefoot DPM

## 2021-03-10 ENCOUNTER — Other Ambulatory Visit: Payer: Medicare Other

## 2021-03-11 ENCOUNTER — Other Ambulatory Visit: Payer: Self-pay

## 2021-03-11 ENCOUNTER — Ambulatory Visit
Admission: RE | Admit: 2021-03-11 | Discharge: 2021-03-11 | Disposition: A | Discharge status: Home / Self Care | Payer: Medicare Other | Source: Ambulatory Visit | Attending: Infectious Diseases | Admitting: Infectious Diseases

## 2021-03-11 DIAGNOSIS — N6310 Unspecified lump in the right breast, unspecified quadrant: Secondary | ICD-10-CM | POA: Diagnosis not present

## 2021-03-11 DIAGNOSIS — R921 Mammographic calcification found on diagnostic imaging of breast: Secondary | ICD-10-CM | POA: Insufficient documentation

## 2021-05-13 ENCOUNTER — Encounter: Payer: Medicare Other | Attending: Physician Assistant | Admitting: Physician Assistant

## 2021-05-13 ENCOUNTER — Other Ambulatory Visit: Payer: Self-pay

## 2021-05-13 DIAGNOSIS — I739 Peripheral vascular disease, unspecified: Secondary | ICD-10-CM | POA: Diagnosis not present

## 2021-05-13 DIAGNOSIS — Z950 Presence of cardiac pacemaker: Secondary | ICD-10-CM | POA: Diagnosis not present

## 2021-05-13 DIAGNOSIS — I872 Venous insufficiency (chronic) (peripheral): Secondary | ICD-10-CM | POA: Insufficient documentation

## 2021-05-13 DIAGNOSIS — L89513 Pressure ulcer of right ankle, stage 3: Secondary | ICD-10-CM | POA: Insufficient documentation

## 2021-05-13 DIAGNOSIS — I890 Lymphedema, not elsewhere classified: Secondary | ICD-10-CM | POA: Diagnosis not present

## 2021-05-13 DIAGNOSIS — Z7901 Long term (current) use of anticoagulants: Secondary | ICD-10-CM | POA: Insufficient documentation

## 2021-05-13 DIAGNOSIS — Z85828 Personal history of other malignant neoplasm of skin: Secondary | ICD-10-CM | POA: Diagnosis not present

## 2021-05-13 NOTE — Progress Notes (Addendum)
SHIORI, ADCOX ([id number]) Visit Report for 05/13/2021 Chief Complaint Document Details Patient Name: [name], [name]. Date of Service: 05/13/2021 12:45 PM Medical Record Number: [id number] Patient Account Number: [id number] Date of Birth/Sex: December 05, 1922 (87 y.o. F) Treating RN: Carlene Coria Primary Care Provider: Adrian Prows Other Clinician: Referring Provider: Adrian Prows Treating Provider/Extender: Skipper Cliche in Treatment: 0 Information Obtained from: Patient Chief Complaint Right foot ulcer Electronic Signature(s) Signed: 05/13/2021 1:49:34 PM By: Worthy Keeler PA-C Entered By: Worthy Keeler on 05/13/2021 13:49:34 [name], [name] ([id number]) -------------------------------------------------------------------------------- HPI Details Patient Name: [name] Date of Service: 05/13/2021 12:45 PM Medical Record Number: [id number] Patient Account Number: [id number] Date of Birth/Sex: 01-29-1923 (87 y.o. 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 Dr. Amalia Hailey the podiatrist saw her on 07/20/2017 and sent her to the wound center for possible hyperbaric oxygen therapy. past medical history of peripheral vascular disease, varicose veins, status post appendectomy, basal cell carcinoma excision from the left leg, cholecystectomy, pacemaker placement, right lower extremity angiography done by Dr. dew in March 2017 with placement of a stent. there is also note of a successful ablation of the right small saphenous vein done which was reviewed by ultrasound on 10/24/2016. the  patient had a right small saphenous vein ablation done on 10/20/2016. The patient has never been a smoker. She has been seen by Dr. Corene Cornea dew the vascular surgeon who most recently saw her on 06/15/2017 for evaluation of ongoing problems with right leg swelling. She had a lower extremity arterial duplex examination done(02/13/17) which showed patent distal right superficial femoral artery stent and above-the-knee popliteal stent without evidence of restenosis. The ABI was more than 1.3 on the right and more than 1.3 on the left. This was consistent with noncompressible arteries due to medial calcification. The right great toe pressure and PPG waveforms are within normal limits and the left great toe pressure and PPG waveforms are decreased. he recommended she continue to wear her compression stockings and continue with elevation. She is scheduled to have a noninvasive arterial study in the near future 08/16/2017 -- had a lower extremity arterial duplex examination done which showed patent distal right superficial femoral artery stent and above-the-knee popliteal stent without evidence of restenosis. The ABI was more than 1.3 on the right and more than 1.3 on the left. This was consistent with noncompressible arteries due to medial calcification. The right great toe pressure and PPG waveforms are within normal limits and the left great toe pressure and PPG waveforms are decreased. the x-ray of the right ankle has not yet been done 08/24/2017 -- had a right ankle x-ray -- IMPRESSION:1. No fracture, bone lesion or evidence of osteomyelitis. 2. Lateral soft tissue swelling with a soft tissue ulcer. she has not yet seen the vascular surgeon for review 08/31/17 on evaluation today patient's wound appears to be showing signs of improvement. She still with her appointment with vascular in order to review her results of her vascular study and then determine if any intervention would be recommended at that  time. No fevers, chills, nausea, or vomiting noted at this time. She has been tolerating the dressing changes without complication. 09/28/17 on evaluation today patient's  wound appears to show signs of good improvement in regard to the granulation tissue which is surfacing. There is still a layer of slough covering the wound and the posterior portion is still significantly deeper than the anterior nonetheless there has been some good sign of things moving towards the better. She is going to go back to Dr. dew for reevaluation to ensure her blood flow is still appropriate. That will be before her next evaluation with [place] next week. No fevers, chills, nausea, or vomiting noted at this time. Patient does have some discomfort rated to be a 3-4/10 depending on activity specifically cleansing the wound makes it worse. 10/05/2017 -- the patient was seen by Dr. Lucky Cowboy last week and noninvasive studies showed a normal right ABI with brisk triphasic waveforms consistent with no arterial insufficiency including normal digital pressures. The duplex showed a patent distal right SFA stent and the proximal SFA was also normal. He was pleased with her test and thought she should have enough of perfusion for normal wound healing. He would see her back in 6 months time. 12/21/17 on evaluation today patient appears to be doing fairly well in regard to her right lateral ankle wound. Unfortunately the main issue that she is expansion at this point is that she is having some issues with what appears to be some cellulitis in the right anterior shin. She has also been noting a little bit of uncomfortable feeling especially last night and her ankle area. I'm afraid that she made the developing a little bit of an infection. With that being said I think it is in the early stages. 12/28/17 on evaluation today patient's ankle appears to be doing excellent. She's making good progress at this point the cellulitis seems to have improved  after last week's evaluation. Overall she is having no significant discomfort which is excellent news. She does have an appointment with Dr. dew on March 29, 2018 for reevaluation in regard to the stent he placed. She seems to have excellent blood flow in the right lower extremity. 01/19/12 on evaluation today patient's wound appears to be doing very well. In fact she does not appear to require debridement at this point, there's no evidence of infection, and overall from the standpoint of the wound she seems to be doing very well. With that being said I believe that it may be time to switch to different dressing away from the [facility] Dressing she tells me she does have a lot going on her friend actually passed away yesterday and she's also having a lot of issues with her husband this obviously is weighing heavy on her as far as your thoughts and concerns today. 01/25/18 on evaluation today patient appears to be doing fairly well in regard to her right lateral malleolus. She has been tolerating the dressing changes without complication. Overall I feel like this is definitely showing signs of improvement as far as how the overall appearance of the wound is there's also evidence of epithelium start to migrate over the granulation tissue. In general I think that she is progressing nicely as far as the wound is concerned. The only concern she really has is whether or not we can switch to every other week visits in order to avoid having as many appointments as her daughters have a difficult time getting her to her appointments as well as the patient's husband to his he is not doing very well at this point. 02/22/18 on evaluation today patient's right lateral malleolus ulcer  appears to be doing great. She has been tolerating the dressing changes without complication. Overall you making excellent progress at this time. Patient is having no significant discomfort. [name], [name] ([id number]) 03/15/18  on evaluation today patient appears to be doing much more poorly in regard to her right lateral ankle ulcer at this point. Unfortunately since have last seen her her husband has passed just a few days ago is obviously weighed heavily on her her daughter also had surgery well she is with her today as usual. There does not appear to be any evidence of infection she does seem to have significant contusion/deep tissue injury to the right lateral malleolus which was not noted previous when I saw her last. It's hard to tell of exactly when this injury occurred although during the time she was spending the night in the hospital this may have been most likely. 03/22/18 on evaluation today patient appears to actually be doing very well in regard to her ulcer. She did unfortunately have a setback which was noted last week however the good news is we seem to be getting back on track and in fact the wound in the core did still have some necrotic tissue which will be addressed at this point today but in general I'm seeing signs that things are on the up and up. She is glad to hear this obviously she's been somewhat concerned that due to the how her wound digressed more recently. 03/29/18 on evaluation today patient appears to be doing fairly well in regard to her right lower extremity lateral malleolus ulcer. She unfortunately does have a new area of pressure injury over the inferior portion where the wound has opened up a little bit larger secondary to the pressure she seems to be getting. She does tell me sometimes when she sleeps at night that it actually hurts and does seem to be pushing on the area little bit more unfortunately. There does not appear to be any evidence of infection which is good news. She has been tolerating the dressing changes without complication. She also did have some bruising in the left second and third toes due to the fact that she may have bump this or injured it although she has  neuropathy so she does not feel she did move recently that may have been where this came from. Nonetheless there does not appear to be any evidence of infection at this time. 04/12/18 on evaluation today patient's wound on the right lateral ankle actually appears to be doing a little bit better with a lot of necrotic docking tissue centrally loosening up in clearing away. However she does have the beginnings of a deep tissue injury on the left lateral malleolus likely due to the fact we've been trying offload the right as much as we have. I think she may benefit from an assistive soft device to help with offloading and it looks like they're looking at one of the doughnut conditions that wraps around the lower leg to offload which I think will definitely do a good job. With that being said I think we definitely need to address this issue on the left before it becomes a wound. Patient is not having significant pain. 04/19/18 on evaluation today patient appears to be doing excellent in regard to the progress she's made with her right lateral ankle ulcer. The left ankle region which did show evidence of a deep tissue injury seems to be resolving there's little fluid noted underneath and a blister there's  nothing open at this point in time overall I feel like this is progressing nicely which is good news. She does not seem to be having significant discomfort at this point which is also good news. 04/25/18-She is here in follow up evaluation for bilateral lateral malleolar ulcers. The right lateral malleolus ulcer with pale subcutaneous tissue exposure, central area of ulcer with tendon/periosteum exposed. The left lateral malleolus ulcer now with central area of nonviable tissue, otherwise deep tissue injury. She is wearing compression wraps to the left lower extremity, she will place the right lower extremity compression wraps on when she gets home. She will be out of town over the weekend and return next  week and follow-up appointment. She completed her doxycycline this morning 05/03/18 on evaluation today patient appears to be doing very well in regard to her right lateral ankle ulcer in general. At least she's showing some signs of improvement in this regard. Unfortunately she has some additional injury to the left lateral malleolus region which appears to be new likely even over the past several days. Again this determination is based on the overall appearance. With that being said the patient is obviously frustrated about this currently. 05/10/18-She is here in follow-up evaluation for bilateral lateral malleolar ulcers. She states she has purchased offloading shoes/boots and they will arrive tomorrow. She was asked to bring them in the office at next week's appointment so her provider is aware of product being utilized. She continues to sleep on right or left side, she has been encouraged to sleep on her back. The right lateral malleolus ulcer is precariously close to peri-osteum; will order xray. The left lateral malleolus ulcer is improved. Will switch back to santyl; she will follow up next week. 05/17/18 on evaluation today patient actually appears to be doing very well in regard to her malleolus her ulcers compared to last time I saw them. She does not seem to have as much in the way of contusion at this point which is great news. With that being said she does continue to have discomfort and I do believe that she is still continuing to benefit from the offloading/pressure reducing boots that were recommended. I think this is the key to trying to get this to heal up completely. 05/24/18 on evaluation today patient actually appears to be doing worse at this point in time unfortunately compared to her last week's evaluation. She is having really no increased pain which is good news unfortunately she does have more maceration in your theme and noted surrounding the right lateral ankle the left  lateral ankle is not really is erythematous I do not see signs of the overt cellulitis on that side. Unfortunately the wounds do not seem to have shown any signs of improvement since the last evaluation. She also has significant swelling especially on the right compared to previous some of this may be due to infection however also think that she may be served better while she has these wounds by compression wrapping versus continuing to use the Juxta-Lite for the time being. Especially with the amount of drainage that she is experiencing at this point. No fevers, chills, nausea, or vomiting noted at this time. 05/31/18 on evaluation today patient appears to actually be doing better in regard to her right lateral lower extremity ulcer specifically on the malleolus region. She has been tolerating the antibiotic without complication. With that being said she still continues to have issues but a little bit of redness although nothing like  she what she was experiencing previous. She still continues to pressure to her ankle area she did get the problem on offloading boots unfortunately she will not wear them she states there too uncomfortable and she can't get in and out of the bed. Nonetheless at this point her wounds seem to be continually getting worse which is not what we want I'm getting somewhat concerned about her progress and how things are going to proceed if we do not intervene in some way shape or form. I therefore had a very lengthy conversation today about offloading yet again and even made a specific suggestion for switching her to a memory foam mattress and even gave the information for a specific one that they could look at getting if it was something that they were interested in considering. She does not want to be considered for a hospital bed air mattress although honestly insurance would not cover it that she does not have any wounds on her trunk. 06/14/18 on evaluation today both wounds  over the bilateral lateral malleolus her ulcers appear to be doing better there's no evidence of pressure injury at this point. She did get the foam mattress for her bed and this does seem to have been extremely beneficial for her in my pinion. Her daughter states that she is having difficulty getting out of bed because of how soft it is. The patient also relates this to be. Nonetheless I do feel like she's actually doing better. Unfortunately right after and around the time she was getting the mattress she also sustained a fall when she got up to go pick up the phone and ended up injuring her right elbow she has [address] sut[address] place. We are not caring for this currently although home health is going to be taking the sutures out shortly. Nonetheless this may be something that we need to evaluate going forward. It depends on how well it has or has not healed in the end. She also recently saw an orthopedic specialist for an injection in the right shoulder just before her fall unfortunately the fall seems to have worsened her pain. 06/21/18 on evaluation today patient appears to be doing about the same in regard to her lateral malleolus ulcers. Both appear to be just a little bit deeper but again we are clinging away the necrotic and dead tissue which I think is why this is progressing towards a deeper realm as opposed [name], [name]. ([id number]) to improving from my measurement standpoint in that regard. Nonetheless she has been tolerating the dressing changes she absolutely hates the memory foam mattress topper that was obtained for her nonetheless I do believe this is still doing excellent as far as taking care of excess pressure in regard to the lateral malleolus regions. She in fact has no pressure injury that I see whereas in weeks past it was week by week I was constantly seeing new pressure injuries. Overall I think it has been very beneficial for her. 07/03/18; patient arrives in my clinic  today. She has deep punched out areas over her bilateral lateral malleoli. The area on the right has some more depth. We spent a lot of time today talking about pressure relief for these areas. This started when her daughter asked for a prescription for a memory foam mattress. I have never written a prescription for a mattress and I don't think insurances would pay for that on an ordinary bed. In any case he came up that she has foam  boots that she refuses to wear. I would suggest going to these before any other offloading issues when she is in bed. They say she is meticulous about offloading this the rest of the day 07/10/18- She is seen in follow-up evaluation for bilateral, lateral malleolus ulcers. There is no improvement in the ulcers. She has purchased and is sleeping on a memory foam mattress/overlay, she has been using the offloading boots nightly over the past week. She has a follow up appointment with vascular medicine at the end of October, in my opinion this follow up should be expedited given her deterioration and suboptimal TBI results. We will order plain film xray of the left ankle as deeper structures are palpable; would consider having MRI, regardless of xray report(s). The ulcers will be treated with iodoflex/iodosorb, she is unable to safely change the dressings daily with santyl. 07/19/18 on evaluation today patient appears to be doing in general visually well in regard to her bilateral lateral malleolus ulcers. She has been tolerating the dressing changes without complication which is good news. With that being said we did have an x-ray performed on 07/12/18 which revealed a slight loosen see in the lateral portion of the distal left fibula which may represent artifact but underline lytic destruction or osteomyelitis could not be excluded. MRI was recommended. With that being said we can see about getting the patient scheduled for an MRI to further evaluate this area. In fact we have  that scheduled currently for August 20 19,019. 07/26/18 on evaluation today patient's wound on the right lateral ankle actually appears to be doing fairly well at this point in my pinion. She has made some good progress currently. With that being said unfortunately in regard to the left lateral ankle ulcer this seems to be a little bit more problematic at this time. In fact as I further evaluated the situation she actually had bone exposed which is the first time that's been the case in the bone appear to be necrotic. Currently I did review patient's note from Dr. Bunnie Domino office with [facility]. He stated that ABI was 1.26 on the right and 0.95 on the left with good waveforms. Her perfusion is stable not reduced from previous studies and her digital waveforms were pretty good particularly on the right. His conclusion upon review of the note was that there was not much she could do to improve her perfusion and he felt she was adequate for wound healing. His suggestion was that she continued to see [place] and consider a synthetic skin graft if there was no underlying infection. He plans to see her back in six months or as needed. 08/01/18 on evaluation today patient appears to be doing better in regard to her right lateral ankle ulcer. Her left lateral ankle ulcer is about the same she still has bone involvement in evidence of necrosis. There does not appear to be evidence of infection at this time On the right lateral lower extremity. I have started her on the Augmentin she picked this up and started this yesterday. This is to get her through until she sees infectious disease which is scheduled for 08/12/18. 08/06/18 on evaluation today patient appears to be doing [name] well considering my discussion with patient's daughter at the end of last week. The area which was marked where she had erythema seems to be improved and this is good news. With that being said overall the patient seems  to be making good improvement when it  comes to the overall appearance of the right lateral ankle ulcer although this has been slow she at least is coming around in this regard. Unfortunately in regard to the left lateral ankle ulcer this is osteomyelitis based on the pathology report as well is bone culture. Nonetheless we are still waiting CT scan. Unfortunately the MRI we originally ordered cannot be performed as the patient is a pacemaker which I had overlooked. Nonetheless we are working on the CT scan approval and scheduling as of now. She did go to the hospital over the weekend and was placed on IV Cefzo for a couple of days. Fortunately this seems to have improved the erythema quite significantly which is good news. There does not appear to be any evidence of worsening infection at this time. She did have some bleeding after the last debridement therefore I did not perform any sharp debridement in regard to left lateral ankle at this point. Patient has been approved for a snap vac for the right lateral ankle. 08/14/18; the patient with wounds over her bilateral lateral malleoli. The area on the right actually looks quite good. Been using a snap back on this area. Healthy granulation and appears to be filling in. Unfortunately the area on the left is really problematic. 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 Also noted to have cellulitis. She saw Dr. Novella Olive of infectious disease today and was put on linezolid. We are able to verify this with her pharmacy. She is completed the Augmentin that she was already on. We've been using Iodoflex to this area 08/23/18 on evaluation today patient's wounds both actually appear to be doing better compared to my prior evaluations. Fortunately she showing signs of good improvement in regard to the overall wound status especially where were using the snap vac on the right. In  regard to left lateral malleolus the wound bed actually appears to be much cleaner than previously noted. I do not feel any phone directly probed during evaluation today and though there is tendon noted this does not appear to be necrotic it's actually fairly good as far as the overall appearance of the tendon is concerned. In general the wound bed actually appears to be doing significantly better than it was previous. Patient is currently in the care of [name] and I did review that note today. He actually has her on two weeks of linezolid and then following the patient will be on 1-2 months of Keflex. That is the plan currently. She has been on antibiotics therapy as prescribed by myself initially starting on July 30, 2018 and has been on that continuously up to this point. 08/30/18 on evaluation today patient actually appears to be doing much better in regard to her right lateral malleolus ulcer. She has been tolerating the dressing changes specifically the snap vac without complication although she did have some issues with the seal currently. Apparently there was some trouble with getting it to maintain over the past week past Sunday. Nonetheless overall the wound appears better in regard to the right lateral malleolus region. In regard to left lateral malleolus this actually show some signs of additional granulation although there still tendon noted in the base of the wound this appears to be healthy not necrotic in any way whatsoever. We are considering potentially using a snap vac for the left lateral malleolus as well the product wrap from [employer], [place], was present in  the clinic today we're going to see this patient I did have her come in with me after obtaining consent from the patient and her daughter in order to look at the wound and see if there's any recommendation one way or another as to whether or not they felt the snapback could be beneficial for the left lateral malleolus region.  But the conclusion was that it might be but that this is definitely a little bit deeper wound than what traditionally would be utilized for a snap vac. 09/06/18 on evaluation today patient actually appears to be doing excellent in my pinion in regard to both ankle ulcers. She has been tolerating the dressing changes without complication which is great news. Specifically we have been using the snap vac. In regard to the right ankle I'm not even sure that this is going to be necessary for today and following as the wound has filled in quite nicely. In regard to the left ankle I do believe [name], [name] ([id number]) that we're seeing excellent epithelialization from the edge as well as granulation in the central portion the tendon is still exposed but there's no evidence of necrotic bone and in general I feel like the patient has made excellent progress even compared to last week with just one week of the snap vac. 09/11/18; this is a patient who has wounds on her bilateral lateral malleoli. Initially both of these were deep stage IV wounds in the setting of chronic arterial insufficiency. She has been revascularized. As I understand think she been using snap vacs to both of these wounds however the area on the right became more superficial and currently she is only using it on the left. Using silver collagen on the right and silver collagen under the back on the left I believe 09/19/18 on evaluation today patient actually appears to be doing very well in regard to her lateral malleolus or ulcers bilaterally. She has been tolerating the dressing changes without complication. Fortunately there does not appear to be any evidence of infection at this time. Overall I feel like she is improving in an excellent manner and I'm very pleased with the fact that everything seems to be turning towards the better for her. This has obviously been a long road. 09/27/18 on evaluation today patient actually appears  to be doing very well in regard to her bilateral lateral malleolus ulcers. She has been tolerating the dressing changes without complication. Fortunately there does not appear to be any evidence of infection at this time which is also great news. No fevers, chills, nausea, or vomiting noted at this time. Overall I feel like she is doing excellent with the snap vac on the left malleolus. She had 40 mL of fluid collection over the past week. 10/04/18 on evaluation today patient actually appears to be doing well in regard to her bilateral lateral malleolus ulcers. She continues to tolerate the dressing changes without complication. One issue that I see is the snap vac on the left lateral malleolus which appears to have sealed off some fluid underlying this area and has not really allowed it to heal to the degree that I would like to see. For that reason I did suggest at this point we may want to pack a small piece of packing strip into this region to allow it to more effectively wick out fluid. 10/11/18 in general the patient today does not feel that she has been doing very well. She's been a little bit lethargic and  subsequently is having bodyaches as well according to what she tells me today. With that being said overall she has been concerned with the fact that something may be worsening although to be honest her wounds really have not been appearing poorly. She does have a new ulcer on her left heel unfortunately. This may be pressure related. Nonetheless it seems to me to have potentially started at least as a blister I do not see any evidence of deep tissue injury. In regard to the left ankle the snap vac still seems to be causing the ceiling off of the deeper part of the wound which is in turn trapping fluid. I'm not extremely pleased with the overall appearance as far as progress from last week to this week therefore I'm gonna discontinue the snap vac at this point. 10/18/18 patient unfortunately  this point has not been feeling well for the past several days. She was seen by Grayland Ormond her primary care provider who is a Librarian, academic at [facility]. Subsequently she states that she's been very weak and generally feeling malaise. No fevers, chills, nausea, or vomiting noted at this time. With that being said bloodwork was performed at the PCP office on the 11th of this month which showed a white blood cell count of 10.7. This was repeated today and shows a white blood cell count of 12.4. This does show signs of worsening. Coupled with the fact that she is feeling worse and that her left ankle wound is not really showing signs of improvement I feel like this is an indication that the osteomyelitis is likely exacerbating not improving. Overall I think we may also want to check her C-reactive protein and sedimentation rate. Actually did call [facility] office this afternoon while the patient was in the office here with me. Subsequently based on the findings we discussed treatment possibilities and I think that it is appropriate for [place] to go ahead and initiate treatment with doxycycline which I'm going to do. Subsequently he did agree to see about adding a CRP and sedimentation rate to her orders. If that has not already been drawn to where they can run it they will contact the patient she can come back to have that check. They are in agreement with plan as far as the patient and her daughter are concerned. Nonetheless also think we need to get in touch with Dr. Henreitta Leber office to see about getting the patient scheduled with him as soon as possible. 11/08/18 on evaluation today patient presents for follow-up concerning her bilateral foot and ankle ulcers. I did do an extensive review of her chart in epic today. Subsequently she was seen by [name] he did initiate Cefepime IV antibiotic therapy. Subsequently she had some issues with her PICC line this had to be removed because it  was coiled and then replaced. Fortunately that was now settled. Unfortunately she has continued have issues with her left heel as well as the issues that she is experiencing with her bilateral lateral malleolus regions. I do believe however both areas seem to be doing a little bit better on evaluation today which is good news. No fevers, chills, nausea, or vomiting noted at this time. She actually has an angiogram schedule with Dr. dew on this coming Monday, November 11, 2018. Subsequently the patient states that she is feeling much better especially than what she was roughly 2 weeks ago. She actually had to cancel an appointment because she was feeling so poorly. No fevers,  chills, nausea, or vomiting noted at this time. 11/15/18 on evaluation today patient actually is status post having had her angiogram with Dr. dew Monday, four days ago. 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 He had to go and work on several areas of the vasculature fortunately he was able to obtain no more than a 30% residual stenosis throughout post procedure. I reviewed this note today. I think this will definitely help with healing at this time. Fortunately there does not appear to be any signs of infection and I do feel like ratio already has a better appearance to it. 11/22/18 upon evaluation today patient actually appears to be doing very well in regard to her wounds in general. The right lateral malleolus looks excellent the heel looks better in the left lateral malleolus also appears to be doing a little better. With that being said the right second toe actually appears to be open and training we been watching this is been dry and stable but now is open. 12/03/2018 Seen today for follow-up and management of multiple bilateral lower extremity wounds. New pressure injury of the great toe which is closed at this time. Wound of the right distal second toe appears larger today with deep undermining  and a pocket of fluid present within the undermining region. Left and right malleolus is wounds are stable today with no signs and symptoms of infection.Denies any needs or concerns during exam today. 12/13/18 on evaluation today patient appears to be doing somewhat better in regard to her left heel ulcer. She also seems to be completely healed in regard to the right lateral malleolus ulcer. The left malleolus ulcer is smaller what unfortunately the wounds which are new over the first and second toes of the right foot are what are most concerning at this point especially the second. Both areas did require sharp debridement today. 12/20/18 on evaluation today patient's wound actually appears to be doing better in regard to left lateral ankle and her right lateral ankle continues to remain healed. The hill ulcer on the left is improved. She does have improvement noted as well in regard to both toe ulcers. Overall I'm very pleased in this regard. No fevers, chills, nausea, or vomiting noted at this time. 12/23/18 on evaluation today patient is seen after she had her toenails trimmed at the podiatrist office due to issues with her right great toe. There was what appeared to be dark eschar on the surface of the wound which had her in the podiatrist concerned. Nonetheless as I remember that during the last office visit I had utilize silver nitrate of this area I was much less concerned about the situation. Subsequently I was able to clean off much of this tissue without any complication today. This does not appear to show any signs of infection and actually look somewhat better Somera, [name] ([id number]) compared to last time post debridement. Her second toe on the right foot actually had callous over and there did appear still be some fluid underneath this that would require debridement today. 12/27/18 on evaluation today patient actually appears to be showing signs of improvement at all locations. Even  the left lateral ankle although this is not quite as great as the other sites. Fortunately there does not appear to be any signs of infection at this time and both of her toes on the right foot seem to be showing signs of improvement which is good news and very pleased  in this regard. 01/03/19 on evaluation today patient appears to be doing better for the most part in regard to her wounds in particular. There does not appear to be any evidence of infection at this time which is good news. Fortunately there is no sign of really worsening anywhere except for the right great toe which she does have what appears to be a bruise/deep tissue injury which is very superficial and already resolving. I'm not sure where this came from I questioned her extensively and she does not recall what may have happened with this. Other than that the patient seems to be doing well even the left lateral ankle ulcer looks good and is getting smaller. 01/10/19 on evaluation today patient appears to be doing well in regard to her left heel wound and both of her toe wounds. Overall I feel like there is definitely improvement here and I'm happy in that regard. With that being said unfortunately she is having issues with the left lateral malleolus ulcer which unfortunately still has a lot of depth to it. This is gonna be a very difficult wound for [place] to be able to truly get to heal. I may want to consider some type of skin substitute to see if this would be of benefit for her. I'll discuss this with her more the next visit most likely. This was something I thought about more at the end of the visit when I was Artie out of the room and the patient had been discharged. 01/17/19 on evaluation today patient appears to be doing very well in regard to her wounds in general. She's been making excellent progress at this time. Fortunately there's no sign of infection at this time either. No fevers, chills, nausea, or vomiting noted at this  time. The biggest issue is still her left lateral malleolus where it appears to be doing well and is getting smaller but still shows a small corner where this is deeper and goes down into what appears to be the joint space. Nonetheless this is taking much longer to heal although it still looks better in smaller than previous evaluations. 01/24/19 on evaluation today patient's wounds actually appear to be doing [name] well in general overall. She did require some sharp debridement in regard to the right great toe but everything else appears to be doing excellent no debridement was even necessary. No fevers, chills, nausea, or vomiting noted at this time. 01/31/19 on evaluation today patient actually appears to be doing much better in regard to her left foot wound on the heel as well as the ankle. The right great toe appears to be a little bit worse today this had callous over and trapped a lot of fluid underneath. Fortunately there's no signs of infection at any site which is great news. 02/07/19 on evaluation today patient actually appears to be doing decently well in regard to all of her ulcers at this point. No sharp debridement was required she is a little bit of hyper granulation in regard to the left lateral ankle as well as the left heel but the hill itself is almost completely healed which is excellent news. Overall been very pleased in this regard. 02/14/19 on evaluation today patient actually appears to be doing very well in regard to her ulcers on the right first toe, left lateral malleolus, and left heel. In fact the heel is almost completely healed at this point. The patient does not show any signs of infection which is good news. Overall very  pleased with how things have progressed. 04/18/19 Telehealth Evaluation During the COVID-19 National Emergency: Verbal Consent: Obtained from patient Allergies: reviewed and the active list is current. Medication changes: patient has no current  medication changes. COVID-19 Screening: 1. Have you traveled internationally or on a cruise ship in the last 14 dayso No 2. Have you had contact with someone with or under investigation for COVID-19o No 3. Have you had a fever, cough, sore throat, or experiencing shortness of breatho No on evaluation today actually did have a visit with this patient through a telehealth encounter with her home health nurse. Subsequently it was noted that the patient actually appears to be doing okay in regard to her wounds both the right great toe as well as the left lateral malleolus have shown signs of improvement although this in your theme around the left lateral malleolus there eschar coverings for both locations. The question is whether or not they are actually close and whether or not home health needs to discharge the patient or not. Nonetheless my concern is this point obviously is that without actually seeing her and being able to evaluate this directly I cannot ensure that she is completely healed which is the question that I'm being asked. 04/22/19 on evaluation today patient presents for her first evaluation since last time I saw her which was actually February 14, 2019. I did do a telehealth visit last week in which point it was questionable whether or not she may be healed and had to bring her in today for confirmation. With that being said she does seem to be doing quite well at this point which is good news. There does not appear to be any drainage in the deed I believe her wounds may be healed. Readmission: 09/04/2019 on evaluation today patient appears to be doing unfortunately somewhat more poorly in regard to her left foot ulcer secondary to a wound that began on 08/21/2019 at least when she first noticed this. Fortunately she has not had any evidence of active infection at this time. Systemically. I also do not necessarily see any evidence of infection at the blister/wound site on the first  metatarsal head plantar aspect. This almost appears to be something that may have just rubbed inappropriately causing this to breakdown. They did not want a wait too long to come in to be seen as again she had significant issues in the past with wounds that took quite a while to heal in fact it was close to 2 years. Nonetheless this does not appear to be quite that bad but again we do need to remove some of the necrotic tissue from the surface of the wound to tell exactly the extent. She does not appear to have any significant arterial disease at this point and again her last ABIs and TBI's are recorded above in the alert section her left ABI was 1.27 with a TBI of 0.72 to the right ABI 1.08 with a TBI of 0.39. Other than this the patient has been doing quite well since I last saw her and that was in May 2020. 09/11/2019 on evaluation today patient appeared to be doing very well with regard to her plantar foot ulcer on the left. In fact this appears to be almost completely healed which is awesome. That is after just 1 week of intervention. With that being said there is no signs of active infection at this time. 09/18/2019 on evaluation today patient actually appears to be doing excellent in fact she  is completely healed based on what I am seeing at this [name], [name] ([id number]) point. Fortunately there is no signs of active infection at this time and overall patient is very pleased to hear that this area has healed so quickly. Readmission: 05/13/2021 upon evaluation today this patient presents for reevaluation here in the clinic. This is a wound that actually we previously took care of. She had 1 on the right ankle and the left the left turned out to be be harder due to to heal but nonetheless is doing great at this point as the right that has reopened and it was noted first just several weeks ago with a scab over it and came off in just the past few days. Fortunately there does not appear to  be any obvious evidence of significant active infection at this time which is great news. No fevers, chills, nausea, vomiting, or diarrhea. 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 There does not appear to be any signs of this interfering in any way with her wound. She does have swelling we previously had compression socks for her ordered but again it does not look like she wears these on a regular basis by any means. Electronic Signature(s) Signed: 05/13/2021 2:04:56 PM By: Worthy Keeler PA-C Entered By: Worthy Keeler on 05/13/2021 14:04:56 Done, [name] ([id number]) -------------------------------------------------------------------------------- Physical Exam Details Patient Name: [name] Date of Service: 05/13/2021 12:45 PM Medical Record Number: [id number] Patient Account Number: [id number] Date of Birth/Sex: 10-Jun-1923 (87 y.o. F) Treating RN: Carlene Coria Primary Care Provider: Adrian Prows Other Clinician: Referring Provider: Adrian Prows Treating Provider/Extender: Skipper Cliche in Treatment: 0 Constitutional sitting or standing blood pressure is within target range for patient.. pulse regular and within target range for patient.Marland Kitchen respirations regular, non- labored and within target range for patient.Marland Kitchen temperature within target range for patient.. Well-nourished and well-hydrated in no acute distress. Eyes conjunctiva clear no eyelid edema noted. pupils equal round and reactive to light and accommodation. Ears, Nose, Mouth, and Throat no gross abnormality of ear auricles or external auditory canals. normal hearing noted during conversation. mucus membranes moist. Respiratory normal breathing without difficulty. Cardiovascular 2+ dorsalis pedis/posterior tibialis pulses. no clubbing, cyanosis, significant edema, <3 sec cap refill. Musculoskeletal normal gait and posture. no significant deformity or  arthritic changes, no loss or range of motion, no clubbing. Psychiatric this patient is able to make decisions and demonstrates good insight into disease process. Alert and Oriented x 3. pleasant and cooperative. Notes Upon inspection patient's wound bed actually showed signs of some good granulation though surface was not completely healthy and where I like to see it but nonetheless did not appear to be too severe either. Fortunately there does not appear to be any signs of active infection systemically either which is also great news. She has an excellent ABI at 1. In general I think that she is doing well all things considered. Electronic Signature(s) Signed: 05/13/2021 2:05:29 PM By: Worthy Keeler PA-C Entered By: Worthy Keeler on 05/13/2021 14:05:29 [name], [name] ([id number]) -------------------------------------------------------------------------------- Physician Orders Details Patient Name: [name] Date of Service: 05/13/2021 12:45 PM Medical Record Number: [id number] Patient Account Number: [id number] Date of Birth/Sex: [birth year] (87 y.o. F) Treating RN: Carlene Coria Primary Care Provider: Adrian Prows Other Clinician: Referring Provider: Adrian Prows Treating Provider/Extender: Skipper Cliche in Treatment: 0 Verbal / Phone Orders: No Diagnosis Coding ICD-10 Coding Code Description [phone]  Pressure ulcer of right ankle, stage 3 I89.0 Lymphedema, not elsewhere classified I87.2 Venous insufficiency (chronic) (peripheral) Z95.0 Presence of cardiac pacemaker Z79.01 Long term (current) use of anticoagulants Follow-up Appointments o Return Appointment in 2 weeks. Home Health o ADMIT to Home Health for wound care. May utilize formulary equivalent dressing for wound treatment orders unless otherwise specified. Home Health Nurse may visit PRN to address patientos wound care needs. - Center Well Bathing/ Shower/ Hygiene o May shower with wound  dressing protected with water repellent cover or cast protector. Edema Control - Lymphedema / Segmental Compressive Device / Other o Optional: One layer of unna paste to top of compression wrap (to act as an anchor). o Elevate, Exercise Daily and Avoid Standing for Long Periods of Time. o Elevate legs to the level of the heart and pump ankles as often as possible o Elevate leg(s) parallel to the floor when sitting. Wound Treatment Wound #7 - Malleolus Wound Laterality: Right, Lateral Cleanser: Normal Saline 2 x Per Week/30 Days Discharge Instructions: Wash your hands with soap and water. Remove old dressing, discard into plastic bag and place into trash. Cleanse the wound with Normal Saline prior to applying a clean dressing using gauze sponges, not tissues or cotton balls. Do not scrub or use excessive force. Pat dry using gauze sponges, not tissue or cotton balls. Cleanser: Soap and Water 2 x Per Week/30 Days Discharge Instructions: Gently cleanse wound with antibacterial soap, rinse and pat dry prior to dressing wounds Primary Dressing: Hydrofera Blue Ready Transfer Foam, 2.5x2.5 (in/in) 2 x Per Week/30 Days Discharge Instructions: Apply Hydrofera Blue Ready to wound bed as directed Secondary Dressing: ABD Pad 5x9 (in/in) 2 x Per Week/30 Days Discharge Instructions: Cover with ABD pad Compression Wrap: Profore Lite LF 3 Multilayer Compression Bandaging System 2 x Per Week/30 Days Discharge Instructions: Apply 3 multi-layer wrap as prescribed. Patient Medications Allergies: Sulfa (Sulfonamide Antibiotics), metronidazole, monistat Notifications Medication Indication Start End gentamicin DOSE topical 0.1 % cream - cream topical applied to the wound bed with each wrap change 2 times per week as directed in the clinic for 30 days JUSTA, HATCHELL ([id number]) Electronic Signature(s) Signed: 05/16/2021 5:46:17 PM By: Worthy Keeler PA-C Signed: 05/18/2021 3:34:38 PM By: Carlene Coria RN Previous Signature: 05/13/2021 4:40:55 PM Version By: Carlene Coria RN Previous Signature: 05/13/2021 5:52:53 PM Version By: Worthy Keeler PA-C Previous Signature: 05/13/2021 2:06:58 PM Version By: Worthy Keeler PA-C Entered By: Carlene Coria on 05/16/2021 10:02:04 [name], [name] ([id number]) -------------------------------------------------------------------------------- Problem List Details Patient Name: [name] Date of Service: 05/13/2021 12:45 PM Medical Record Number: [id number] Patient Account Number: [id number] Date of Birth/Sex: 1923/07/04 (87 y.o. F) Treating RN: Carlene Coria Primary Care Provider: Adrian Prows Other Clinician: Referring Provider: Adrian Prows Treating Provider/Extender: Skipper Cliche in Treatment: 0 Active Problems ICD-10 Encounter Code Description Active Date MDM Diagnosis 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, stage 3 05/13/2021 No Yes I89.0 Lymphedema, not elsewhere classified 05/13/2021 No Yes I87.2 Venous insufficiency (chronic) (peripheral) 05/13/2021 No Yes Z95.0 Presence of cardiac pacemaker 05/13/2021 No Yes Z79.01 Long term (current) use of anticoagulants 05/13/2021 No Yes Inactive Problems Resolved Problems Electronic Signature(s) Signed: 05/13/2021 1:49:20 PM By: Worthy Keeler PA-C Entered By: Worthy Keeler on 05/13/2021 13:49:20 [place], [name] ([id number]) -------------------------------------------------------------------------------- Progress Note Details Patient Name: [name] Date of Service: 05/13/2021 12:45 PM Medical Record Number: [id number] Patient Account Number: [id number] Date of Birth/Sex: 1923-09-24 (87 y.o. 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 Dr. Amalia Hailey the podiatrist saw her on 07/20/2017 and sent her to the wound center for possible hyperbaric oxygen therapy. past medical history of peripheral vascular disease, varicose veins, status post appendectomy, basal cell carcinoma excision from the left leg, cholecystectomy, pacemaker placement, right lower extremity angiography done by Dr. dew in March 2017 with placement of a stent. there is also note of a successful ablation of the right small saphenous vein done which was reviewed by ultrasound on 10/24/2016. the patient had a right small saphenous vein ablation done on 10/20/2016. The patient has never been a smoker. She has been seen by Dr. Corene Cornea dew the vascular surgeon who most recently saw her on 06/15/2017 for evaluation of ongoing problems with right leg swelling. She had a lower extremity arterial duplex examination done(02/13/17) which showed patent distal right superficial femoral artery stent and above-the-knee popliteal stent without evidence of restenosis. The ABI was more than 1.3 on the right and more than 1.3 on the left. This was consistent with noncompressible arteries due to medial calcification. The right great toe pressure and PPG waveforms are within normal limits and the left great toe pressure and PPG waveforms are decreased. he recommended she continue to wear her compression stockings and continue with elevation. She is scheduled to have a noninvasive arterial study in the near future 08/16/2017 -- had a lower extremity arterial duplex examination done which showed patent distal right superficial femoral artery stent and above-the-knee popliteal stent without evidence of restenosis. The ABI was more than 1.3 on the right and more than  1.3 on the left. This was consistent with noncompressible arteries due to medial calcification. The right great toe pressure and PPG waveforms are within normal limits and the left great toe pressure and PPG waveforms are decreased. the x-ray of the right ankle has not yet been done 08/24/2017 -- had a right ankle x-ray -- IMPRESSION:1. No fracture, bone lesion or evidence of osteomyelitis. 2. Lateral soft tissue swelling with a soft tissue ulcer. she has not yet seen the vascular surgeon for review 08/31/17 on evaluation today patient's wound appears to be showing signs of improvement. She still with her appointment with vascular in order to review her results of her vascular study and then determine if any intervention would be recommended at that time. No fevers, chills, nausea, or vomiting noted at this time. She has been tolerating the dressing changes without complication. 09/28/17 on evaluation today patient's wound appears to show signs of good improvement in regard to the granulation tissue which is surfacing. There is still a layer of slough covering the wound and the posterior portion is still significantly deeper than the anterior nonetheless there has been some good sign of things moving towards the better. She is going to go back to Dr. dew for reevaluation to ensure her blood flow is still appropriate. That will be before her next evaluation with [place] next week. No fevers, chills, nausea, or vomiting noted at this time. Patient does have some discomfort rated to be a 3-4/10 depending on activity specifically cleansing the wound makes it worse. 10/05/2017 -- the patient was seen  by Dr. Lucky Cowboy last week and noninvasive studies showed a normal right ABI with brisk triphasic waveforms consistent with no arterial insufficiency including normal digital pressures. The duplex showed a patent distal right SFA stent and the proximal SFA was also normal. He was pleased with her test and thought she  should have enough of perfusion for normal wound healing. He would see her back in 6 months time. 12/21/17 on evaluation today patient appears to be doing fairly well in regard to her right lateral ankle wound. Unfortunately the main issue that she is expansion at this point is that she is having some issues with what appears to be some cellulitis in the right anterior shin. She has also been noting a little bit of uncomfortable feeling especially last night and her ankle area. I'm afraid that she made the developing a little bit of an infection. With that being said I think it is in the early stages. 12/28/17 on evaluation today patient's ankle appears to be doing excellent. She's making good progress at this point the cellulitis seems to have improved after last week's evaluation. Overall she is having no significant discomfort which is excellent news. She does have an appointment with Dr. dew on March 29, 2018 for reevaluation in regard to the stent he placed. She seems to have excellent blood flow in the right lower extremity. 01/19/12 on evaluation today patient's wound appears to be doing very well. In fact she does not appear to require debridement at this point, there's no evidence of infection, and overall from the standpoint of the wound she seems to be doing very well. With that being said I believe that it may be time to switch to different dressing away from the [facility] she tells me she does have a lot going on her friend actually passed away yesterday and she's also having a lot of issues with her husband this obviously is weighing heavy on her as far as your thoughts and concerns today. 01/25/18 on evaluation today patient appears to be doing fairly well in regard to her right lateral malleolus. She has been tolerating the dressing changes without complication. Overall I feel like this is definitely showing signs of improvement as far as how the overall appearance of the  wound is there's also evidence of epithelium start to migrate over the granulation tissue. In general I think that she is progressing nicely as far as the wound is concerned. The only concern she really has is whether or not we can switch to every other week visits in order to avoid having as many ZYIA, KANEKO ([id number]) appointments as her daughters have a difficult time getting her to her appointments as well as the patient's husband to his he is not doing very well at this point. 02/22/18 on evaluation today patient's right lateral malleolus ulcer appears to be doing great. She has been tolerating the dressing changes without complication. Overall you making excellent progress at this time. Patient is having no significant discomfort. 03/15/18 on evaluation today patient appears to be doing much more poorly in regard to her right lateral ankle ulcer at this point. Unfortunately since have last seen her her husband has passed just a few days ago is obviously weighed heavily on her her daughter also had surgery well she is with her today as usual. There does not appear to be any evidence of infection she does seem to have significant contusion/deep tissue injury to the right lateral malleolus which was not  noted previous when I saw her last. It's hard to tell of exactly when this injury occurred although during the time she was spending the night in the hospital this may have been most likely. 03/22/18 on evaluation today patient appears to actually be doing very well in regard to her ulcer. She did unfortunately have a setback which was noted last week however the good news is we seem to be getting back on track and in fact the wound in the core did still have some necrotic tissue which will be addressed at this point today but in general I'm seeing signs that things are on the up and up. She is glad to hear this obviously she's been somewhat concerned that due to the how her wound digressed  more recently. 03/29/18 on evaluation today patient appears to be doing fairly well in regard to her right lower extremity lateral malleolus ulcer. She unfortunately does have a new area of pressure injury over the inferior portion where the wound has opened up a little bit larger secondary to the pressure she seems to be getting. She does tell me sometimes when she sleeps at night that it actually hurts and does seem to be pushing on the area little bit more unfortunately. There does not appear to be any evidence of infection which is good news. She has been tolerating the dressing changes without complication. She also did have some bruising in the left second and third toes due to the fact that she may have bump this or injured it although she has neuropathy so she does not feel she did move recently that may have been where this came from. Nonetheless there does not appear to be any evidence of infection at this time. 04/12/18 on evaluation today patient's wound on the right lateral ankle actually appears to be doing a little bit better with a lot of necrotic docking tissue centrally loosening up in clearing away. However she does have the beginnings of a deep tissue injury on the left lateral malleolus likely due to the fact we've been trying offload the right as much as we have. I think she may benefit from an assistive soft device to help with offloading and it looks like they're looking at one of the doughnut conditions that wraps around the lower leg to offload which I think will definitely do a good job. With that being said I think we definitely need to address this issue on the left before it becomes a wound. Patient is not having significant pain. 04/19/18 on evaluation today patient appears to be doing excellent in regard to the progress she's made with her right lateral ankle ulcer. The left ankle region which did show evidence of a deep tissue injury seems to be resolving there's little  fluid noted underneath and a blister there's nothing open at this point in time overall I feel like this is progressing nicely which is good news. She does not seem to be having significant discomfort at this point which is also good news. 04/25/18-She is here in follow up evaluation for bilateral lateral malleolar ulcers. The right lateral malleolus ulcer with pale subcutaneous tissue exposure, central area of ulcer with tendon/periosteum exposed. The left lateral malleolus ulcer now with central area of nonviable tissue, otherwise deep tissue injury. She is wearing compression wraps to the left lower extremity, she will place the right lower extremity compression wraps on when she gets home. She will be out of town over the weekend and return  next week and follow-up appointment. She completed her doxycycline this morning 05/03/18 on evaluation today patient appears to be doing very well in regard to her right lateral ankle ulcer in general. At least she's showing some signs of improvement in this regard. Unfortunately she has some additional injury to the left lateral malleolus region which appears to be new likely even over the past several days. Again this determination is based on the overall appearance. With that being said the patient is obviously frustrated about this currently. 05/10/18-She is here in follow-up evaluation for bilateral lateral malleolar ulcers. She states she has purchased offloading shoes/boots and they will arrive tomorrow. She was asked to bring them in the office at next week's appointment so her provider is aware of product being utilized. She continues to sleep on right or left side, she has been encouraged to sleep on her back. The right lateral malleolus ulcer is precariously close to peri-osteum; will order xray. The left lateral malleolus ulcer is improved. Will switch back to santyl; she will follow up next week. 05/17/18 on evaluation today patient actually appears to  be doing very well in regard to her malleolus her ulcers compared to last time I saw them. She does not seem to have as much in the way of contusion at this point which is great news. With that being said she does continue to have discomfort and I do believe that she is still continuing to benefit from the offloading/pressure reducing boots that were recommended. I think this is the key to trying to get this to heal up completely. 05/24/18 on evaluation today patient actually appears to be doing worse at this point in time unfortunately compared to her last week's evaluation. She is having really no increased pain which is good news unfortunately she does have more maceration in your theme and noted surrounding the right lateral ankle the left lateral ankle is not really is erythematous I do not see signs of the overt cellulitis on that side. Unfortunately the wounds do not seem to have shown any signs of improvement since the last evaluation. She also has significant swelling especially on the right compared to previous some of this may be due to infection however also think that she may be served better while she has these wounds by compression wrapping versus continuing to use the Juxta-Lite for the time being. Especially with the amount of drainage that she is experiencing at this point. No fevers, chills, nausea, or vomiting noted at this time. 05/31/18 on evaluation today patient appears to actually be doing better in regard to her right lateral lower extremity ulcer specifically on the malleolus region. She has been tolerating the antibiotic without complication. With that being said she still continues to have issues but a little bit of redness although nothing like she what she was experiencing previous. She still continues to pressure to her ankle area she did get the problem on offloading boots unfortunately she will not wear them she states there too uncomfortable and she can't get in and out  of the bed. Nonetheless at this point her wounds seem to be continually getting worse which is not what we want I'm getting somewhat concerned about her progress and how things are going to proceed if we do not intervene in some way shape or form. I therefore had a very lengthy conversation today about offloading yet again and even made a specific suggestion for switching her to a memory foam mattress and even gave  the information for a specific one that they could look at getting if it was something that they were interested in considering. She does not want to be considered for a hospital bed air mattress although honestly insurance would not cover it that she does not have any wounds on her trunk. 06/14/18 on evaluation today both wounds over the bilateral lateral malleolus her ulcers appear to be doing better there's no evidence of pressure injury at this point. She did get the foam mattress for her bed and this does seem to have been extremely beneficial for her in my pinion. Her daughter states that she is having difficulty getting out of bed because of how soft it is. The patient also relates this to be. Nonetheless I do feel like she's actually doing better. Unfortunately right after and around the time she was getting the mattress she also sustained a fall when she got up to go pick up the phone and ended up injuring her right elbow she has 18 sutures in place. We are not caring for this currently although home health is going to be taking the sutures out shortly. Nonetheless this may be something that we need to evaluate going forward. It depends on [name], [name] ([id number]) how well it has or has not healed in the end. She also recently saw an orthopedic specialist for an injection in the right shoulder just before her fall unfortunately the fall seems to have worsened her pain. 06/21/18 on evaluation today patient appears to be doing about the same in regard to her lateral malleolus  ulcers. Both appear to be just a little bit deeper but again we are clinging away the necrotic and dead tissue which I think is why this is progressing towards a deeper realm as opposed to improving from my measurement standpoint in that regard. Nonetheless she has been tolerating the dressing changes she absolutely hates the memory foam mattress topper that was obtained for her nonetheless I do believe this is still doing excellent as far as taking care of excess pressure in regard to the lateral malleolus regions. She in fact has no pressure injury that I see whereas in weeks past it was week by week I was constantly seeing new pressure injuries. Overall I think it has been very beneficial for her. 07/03/18; patient arrives in my clinic today. She has deep punched out areas over her bilateral lateral malleoli. The area on the right has some more depth. We spent a lot of time today talking about pressure relief for these areas. This started when her daughter asked for a prescription for a memory foam mattress. I have never written a prescription for a mattress and I don't think insurances would pay for that on an ordinary bed. In any case he came up that she has foam boots that she refuses to wear. I would suggest going to these before any other offloading issues when she is in bed. They say she is meticulous about offloading this the rest of the day 07/10/18- She is seen in follow-up evaluation for bilateral, lateral malleolus ulcers. There is no improvement in the ulcers. She has purchased and is sleeping on a memory foam mattress/overlay, she has been using the offloading boots nightly over the past week. She has a follow up appointment with vascular medicine at the end of October, in my opinion this follow up should be expedited given her deterioration and suboptimal TBI results. We will order plain film xray of the left ankle  as deeper structures are palpable; would consider having MRI, regardless  of xray report(s). The ulcers will be treated with iodoflex/iodosorb, she is unable to safely change the dressings daily with santyl. 07/19/18 on evaluation today patient appears to be doing in general visually well in regard to her bilateral lateral malleolus ulcers. She has been tolerating the dressing changes without complication which is good news. With that being said we did have an x-ray performed on 07/12/18 which revealed a slight loosen see in the lateral portion of the distal left fibula which may represent artifact but underline lytic destruction or osteomyelitis could not be excluded. MRI was recommended. With that being said we can see about getting the patient scheduled for an MRI to further evaluate this area. In fact we have that scheduled currently for August 20 19,019. 07/26/18 on evaluation today patient's wound on the right lateral ankle actually appears to be doing fairly well at this point in my pinion. She has made some good progress currently. With that being said unfortunately in regard to the left lateral ankle ulcer this seems to be a little bit more problematic at this time. In fact as I further evaluated the situation she actually had bone exposed which is the first time that's been the case in the bone appear to be necrotic. Currently I did review patient's note from Dr. Bunnie Domino office with [facility]. He stated that ABI was 1.26 on the right and 0.95 on the left with good waveforms. Her perfusion is stable not reduced from previous studies and her digital waveforms were pretty good particularly on the right. His conclusion upon review of the note was that there was not much she could do to improve her perfusion and he felt she was adequate for wound healing. His suggestion was that she continued to see [place] and consider a synthetic skin graft if there was no underlying infection. He plans to see her back in six months or as needed. 08/01/18 on evaluation  today patient appears to be doing better in regard to her right lateral ankle ulcer. Her left lateral ankle ulcer is about the same she still has bone involvement in evidence of necrosis. There does not appear to be evidence of infection at this time On the right lateral lower extremity. I have started her on the Augmentin she picked this up and started this yesterday. This is to get her through until she sees infectious disease which is scheduled for 08/12/18. 08/06/18 on evaluation today patient appears to be doing [name] well considering my discussion with patient's daughter at the end of last week. The area which was marked where she had erythema seems to be improved and this is good news. With that being said overall the patient seems to be making good improvement when it comes to the overall appearance of the right lateral ankle ulcer although this has been slow she at least is coming around in this regard. Unfortunately in regard to the left lateral ankle ulcer this is osteomyelitis based on the pathology report as well is bone culture. Nonetheless we are still waiting CT scan. Unfortunately the MRI we originally ordered cannot be performed as the patient is a pacemaker which I had overlooked. Nonetheless we are working on the CT scan approval and scheduling as of now. She did go to the hospital over the weekend and was placed on IV Cefzo for a couple of days. Fortunately this seems to have improved the erythema quite significantly  which is good news. There does not appear to be any evidence of worsening infection at this time. She did have some bleeding after the last debridement therefore I did not perform any sharp debridement in regard to left lateral ankle at this point. Patient has been approved for a snap vac for the right lateral ankle. 08/14/18; the patient with wounds over her bilateral lateral malleoli. The area on the right actually looks quite good. Been using a snap back on this area.  Healthy granulation and appears to be filling in. Unfortunately the area on the left is really problematic. 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 Also noted to have cellulitis. She saw Dr. Novella Olive of infectious disease today and was put on linezolid. We are able to verify this with her pharmacy. She is completed the Augmentin that she was already on. We've been using Iodoflex to this area 08/23/18 on evaluation today patient's wounds both actually appear to be doing better compared to my prior evaluations. Fortunately she showing signs of good improvement in regard to the overall wound status especially where were using the snap vac on the right. In regard to left lateral malleolus the wound bed actually appears to be much cleaner than previously noted. I do not feel any phone directly probed during evaluation today and though there is tendon noted this does not appear to be necrotic it's actually fairly good as far as the overall appearance of the tendon is concerned. In general the wound bed actually appears to be doing significantly better than it was previous. Patient is currently in the care of [name] and I did review that note today. He actually has her on two weeks of linezolid and then following the patient will be on 1-2 months of Keflex. That is the plan currently. She has been on antibiotics therapy as prescribed by myself initially starting on July 30, 2018 and has been on that continuously up to this point. 08/30/18 on evaluation today patient actually appears to be doing much better in regard to her right lateral malleolus ulcer. She has been tolerating the dressing changes specifically the snap vac without complication although she did have some issues with the seal currently. Apparently there was some trouble with getting it to maintain over the past week past Sunday. Nonetheless overall the wound  appears better in regard to the right lateral malleolus region. In regard to left lateral malleolus this actually show some signs of additional granulation although there still tendon noted in the base of the wound this appears to be healthy not necrotic in any way whatsoever. We are considering potentially using a snap vac for the left lateral malleolus as well the product wrap from [employer], [place], was present in the clinic today we're going to see this patient I did have her come in with me after obtaining consent from the patient and her daughter in order to look at the wound and see if there's any recommendation one way or another as to whether or not they felt the snapback could be beneficial for the left lateral malleolus region. But [name], [name] ([id number]) the conclusion was that it might be but that this is definitely a little bit deeper wound than what traditionally would be utilized for a snap vac. 09/06/18 on evaluation today patient actually appears to be doing excellent in my pinion in regard to both ankle ulcers. She has been  tolerating the dressing changes without complication which is great news. Specifically we have been using the snap vac. In regard to the right ankle I'm not even sure that this is going to be necessary for today and following as the wound has filled in quite nicely. In regard to the left ankle I do believe that we're seeing excellent epithelialization from the edge as well as granulation in the central portion the tendon is still exposed but there's no evidence of necrotic bone and in general I feel like the patient has made excellent progress even compared to last week with just one week of the snap vac. 09/11/18; this is a patient who has wounds on her bilateral lateral malleoli. Initially both of these were deep stage IV wounds in the setting of chronic arterial insufficiency. She has been revascularized. As I understand think she been using snap vacs to both  of these wounds however the area on the right became more superficial and currently she is only using it on the left. Using silver collagen on the right and silver collagen under the back on the left I believe 09/19/18 on evaluation today patient actually appears to be doing very well in regard to her lateral malleolus or ulcers bilaterally. She has been tolerating the dressing changes without complication. Fortunately there does not appear to be any evidence of infection at this time. Overall I feel like she is improving in an excellent manner and I'm very pleased with the fact that everything seems to be turning towards the better for her. This has obviously been a long road. 09/27/18 on evaluation today patient actually appears to be doing very well in regard to her bilateral lateral malleolus ulcers. She has been tolerating the dressing changes without complication. Fortunately there does not appear to be any evidence of infection at this time which is also great news. No fevers, chills, nausea, or vomiting noted at this time. Overall I feel like she is doing excellent with the snap vac on the left malleolus. She had 40 mL of fluid collection over the past week. 10/04/18 on evaluation today patient actually appears to be doing well in regard to her bilateral lateral malleolus ulcers. She continues to tolerate the dressing changes without complication. One issue that I see is the snap vac on the left lateral malleolus which appears to have sealed off some fluid underlying this area and has not really allowed it to heal to the degree that I would like to see. For that reason I did suggest at this point we may want to pack a small piece of packing strip into this region to allow it to more effectively wick out fluid. 10/11/18 in general the patient today does not feel that she has been doing very well. She's been a little bit lethargic and subsequently is having bodyaches as well according to what she  tells me today. With that being said overall she has been concerned with the fact that something may be worsening although to be honest her wounds really have not been appearing poorly. She does have a new ulcer on her left heel unfortunately. This may be pressure related. Nonetheless it seems to me to have potentially started at least as a blister I do not see any evidence of deep tissue injury. In regard to the left ankle the snap vac still seems to be causing the ceiling off of the deeper part of the wound which is in turn trapping fluid. I'm not extremely pleased  with the overall appearance as far as progress from last week to this week therefore I'm gonna discontinue the snap vac at this point. 10/18/18 patient unfortunately this point has not been feeling well for the past several days. She was seen by Grayland Ormond her primary care provider who is a Librarian, academic at [facility]. Subsequently she states that she's been very weak and generally feeling malaise. No fevers, chills, nausea, or vomiting noted at this time. With that being said bloodwork was performed at the PCP office on the 11th of this month which showed a white blood cell count of 10.7. This was repeated today and shows a white blood cell count of 12.4. This does show signs of worsening. Coupled with the fact that she is feeling worse and that her left ankle wound is not really showing signs of improvement I feel like this is an indication that the osteomyelitis is likely exacerbating not improving. Overall I think we may also want to check her C-reactive protein and sedimentation rate. Actually did call [facility] office this afternoon while the patient was in the office here with me. Subsequently based on the findings we discussed treatment possibilities and I think that it is appropriate for [place] to go ahead and initiate treatment with doxycycline which I'm going to do. Subsequently he did agree to see about adding  a CRP and sedimentation rate to her orders. If that has not already been drawn to where they can run it they will contact the patient she can come back to have that check. They are in agreement with plan as far as the patient and her daughter are concerned. Nonetheless also think we need to get in touch with Dr. Henreitta Leber office to see about getting the patient scheduled with him as soon as possible. 11/08/18 on evaluation today patient presents for follow-up concerning her bilateral foot and ankle ulcers. I did do an extensive review of her chart in epic today. Subsequently she was seen by [name] he did initiate Cefepime IV antibiotic therapy. Subsequently she had some issues with her PICC line this had to be removed because it was coiled and then replaced. Fortunately that was now settled. Unfortunately she has continued have issues with her left heel as well as the issues that she is experiencing with her bilateral lateral malleolus regions. I do believe however both areas seem to be doing a little bit better on evaluation today which is good news. No fevers, chills, nausea, or vomiting noted at this time. She actually has an angiogram schedule with Dr. dew on this coming Monday, November 11, 2018. Subsequently the patient states that she is feeling much better especially than what she was roughly 2 weeks ago. She actually had to cancel an appointment because she was feeling so poorly. No fevers, chills, nausea, or vomiting noted at this time. 11/15/18 on evaluation today patient actually is status post having had her angiogram with Dr. dew Monday, four days ago. 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 He had to go and work on several areas of the vasculature fortunately he was able to obtain no more than a 30% residual stenosis throughout post procedure. I reviewed this note today. I think this will definitely help with healing at this time. Fortunately there does not  appear to be any signs of infection and I do feel like ratio already has a better appearance to it. 11/22/18 upon evaluation today patient actually  appears to be doing very well in regard to her wounds in general. The right lateral malleolus looks excellent the heel looks better in the left lateral malleolus also appears to be doing a little better. With that being said the right second toe actually appears to be open and training we been watching this is been dry and stable but now is open. 12/03/2018 Seen today for follow-up and management of multiple bilateral lower extremity wounds. New pressure injury of the great toe which is closed at this time. Wound of the right distal second toe appears larger today with deep undermining and a pocket of fluid present within the undermining region. Left and right malleolus is wounds are stable today with no signs and symptoms of infection.Denies any needs or concerns during exam today. 12/13/18 on evaluation today patient appears to be doing somewhat better in regard to her left heel ulcer. She also seems to be completely healed in regard to the right lateral malleolus ulcer. The left malleolus ulcer is smaller what unfortunately the wounds which are new over the first and second toes of the right foot are what are most concerning at this point especially the second. Both areas did require sharp debridement today. 12/20/18 on evaluation today patient's wound actually appears to be doing better in regard to left lateral ankle and her right lateral ankle continues to remain healed. The hill ulcer on the left is improved. She does have improvement noted as well in regard to both toe ulcers. Overall I'm very pleased in this regard. No fevers, chills, nausea, or vomiting noted at this time. [name], [name] ([id number]) 12/23/18 on evaluation today patient is seen after she had her toenails trimmed at the podiatrist office due to issues with her right great toe.  There was what appeared to be dark eschar on the surface of the wound which had her in the podiatrist concerned. Nonetheless as I remember that during the last office visit I had utilize silver nitrate of this area I was much less concerned about the situation. Subsequently I was able to clean off much of this tissue without any complication today. This does not appear to show any signs of infection and actually look somewhat better compared to last time post debridement. Her second toe on the right foot actually had callous over and there did appear still be some fluid underneath this that would require debridement today. 12/27/18 on evaluation today patient actually appears to be showing signs of improvement at all locations. Even the left lateral ankle although this is not quite as great as the other sites. Fortunately there does not appear to be any signs of infection at this time and both of her toes on the right foot seem to be showing signs of improvement which is good news and very pleased in this regard. 01/03/19 on evaluation today patient appears to be doing better for the most part in regard to her wounds in particular. There does not appear to be any evidence of infection at this time which is good news. Fortunately there is no sign of really worsening anywhere except for the right great toe which she does have what appears to be a bruise/deep tissue injury which is very superficial and already resolving. I'm not sure where this came from I questioned her extensively and she does not recall what may have happened with this. Other than that the patient seems to be doing well even the left lateral ankle ulcer looks good and is  getting smaller. 01/10/19 on evaluation today patient appears to be doing well in regard to her left heel wound and both of her toe wounds. Overall I feel like there is definitely improvement here and I'm happy in that regard. With that being said unfortunately she is  having issues with the left lateral malleolus ulcer which unfortunately still has a lot of depth to it. This is gonna be a very difficult wound for [place] to be able to truly get to heal. I may want to consider some type of skin substitute to see if this would be of benefit for her. I'll discuss this with her more the next visit most likely. This was something I thought about more at the end of the visit when I was Artie out of the room and the patient had been discharged. 01/17/19 on evaluation today patient appears to be doing very well in regard to her wounds in general. She's been making excellent progress at this time. Fortunately there's no sign of infection at this time either. No fevers, chills, nausea, or vomiting noted at this time. The biggest issue is still her left lateral malleolus where it appears to be doing well and is getting smaller but still shows a small corner where this is deeper and goes down into what appears to be the joint space. Nonetheless this is taking much longer to heal although it still looks better in smaller than previous evaluations. 01/24/19 on evaluation today patient's wounds actually appear to be doing [name] well in general overall. She did require some sharp debridement in regard to the right great toe but everything else appears to be doing excellent no debridement was even necessary. No fevers, chills, nausea, or vomiting noted at this time. 01/31/19 on evaluation today patient actually appears to be doing much better in regard to her left foot wound on the heel as well as the ankle. The right great toe appears to be a little bit worse today this had callous over and trapped a lot of fluid underneath. Fortunately there's no signs of infection at any site which is great news. 02/07/19 on evaluation today patient actually appears to be doing decently well in regard to all of her ulcers at this point. No sharp debridement was required she is a little bit of hyper  granulation in regard to the left lateral ankle as well as the left heel but the hill itself is almost completely healed which is excellent news. Overall been very pleased in this regard. 02/14/19 on evaluation today patient actually appears to be doing very well in regard to her ulcers on the right first toe, left lateral malleolus, and left heel. In fact the heel is almost completely healed at this point. The patient does not show any signs of infection which is good news. Overall very pleased with how things have progressed. 04/18/19 Telehealth Evaluation During the COVID-19 National Emergency: Verbal Consent: Obtained from patient Allergies: reviewed and the active list is current. Medication changes: patient has no current medication changes. COVID-19 Screening: 1. Have you traveled internationally or on a cruise ship in the last 14 dayso No 2. Have you had contact with someone with or under investigation for COVID-19o No 3. Have you had a fever, cough, sore throat, or experiencing shortness of breatho No on evaluation today actually did have a visit with this patient through a telehealth encounter with her home health nurse. Subsequently it was noted that the patient actually appears to be doing okay in  regard to her wounds both the right great toe as well as the left lateral malleolus have shown signs of improvement although this in your theme around the left lateral malleolus there eschar coverings for both locations. The question is whether or not they are actually close and whether or not home health needs to discharge the patient or not. Nonetheless my concern is this point obviously is that without actually seeing her and being able to evaluate this directly I cannot ensure that she is completely healed which is the question that I'm being asked. 04/22/19 on evaluation today patient presents for her first evaluation since last time I saw her which was actually February 14, 2019. I did do  a telehealth visit last week in which point it was questionable whether or not she may be healed and had to bring her in today for confirmation. With that being said she does seem to be doing quite well at this point which is good news. There does not appear to be any drainage in the deed I believe her wounds may be healed. Readmission: 09/04/2019 on evaluation today patient appears to be doing unfortunately somewhat more poorly in regard to her left foot ulcer secondary to a wound that began on 08/21/2019 at least when she first noticed this. Fortunately she has not had any evidence of active infection at this time. Systemically. I also do not necessarily see any evidence of infection at the blister/wound site on the first metatarsal head plantar aspect. This almost appears to be something that may have just rubbed inappropriately causing this to breakdown. They did not want a wait too long to come in to be seen as again she had significant issues in the past with wounds that took quite a while to heal in fact it was close to 2 years. Nonetheless this does not appear to be quite that bad but again we do need to remove some of the necrotic tissue from the surface of the wound to tell exactly the extent. She does not appear to have any significant arterial disease at this point and again her last ABIs and TBI's are recorded above in the alert section her left ABI was 1.27 with a TBI of 0.72 to the right ABI 1.08 with a TBI of 0.39. Other than this the patient has been doing quite well since I last saw her and that was in May 2020. [name], [name] ([id number]) 09/11/2019 on evaluation today patient appeared to be doing very well with regard to her plantar foot ulcer on the left. In fact this appears to be almost completely healed which is awesome. That is after just 1 week of intervention. With that being said there is no signs of active infection at this time. 09/18/2019 on evaluation today patient  actually appears to be doing excellent in fact she is completely healed based on what I am seeing at this point. Fortunately there is no signs of active infection at this time and overall patient is very pleased to hear that this area has healed so quickly. Readmission: 05/13/2021 upon evaluation today this patient presents for reevaluation here in the clinic. This is a wound that actually we previously took care of. She had 1 on the right ankle and the left the left turned out to be be harder due to to heal but nonetheless is doing great at this point as the right that has reopened and it was noted first just several weeks ago with a scab over  it and came off in just the past few days. Fortunately there does not appear to be any obvious evidence of significant active infection at this time which is great news. No fevers, chills, nausea, vomiting, or diarrhea. 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 There does not appear to be any signs of this interfering in any way with her wound. She does have swelling we previously had compression socks for her ordered but again it does not look like she wears these on a regular basis by any means. Patient History Information obtained from Patient. Allergies Sulfa (Sulfonamide Antibiotics), metronidazole, monistat Family History Cancer - Father,Siblings, Heart Disease - Siblings, No family history of Diabetes, Hereditary Spherocytosis, Hypertension, Kidney Disease, Lung Disease, Seizures, Stroke, Thyroid Problems, Tuberculosis. Social History Never smoker, Marital Status - Widowed, Alcohol Use - Never, Drug Use - No History, Caffeine Use - Rarely. Medical History Eyes Patient has history of Cataracts - surgery Denies history of Glaucoma, Optic Neuritis Ear/Nose/Mouth/Throat Denies history of Chronic sinus problems/congestion, Middle ear problems Hematologic/Lymphatic Denies history of Anemia,  Hemophilia, Human Immunodeficiency Virus, Lymphedema, Sickle Cell Disease Respiratory Denies history of Aspiration, Asthma, Chronic Obstructive Pulmonary Disease (COPD), Pneumothorax, Sleep Apnea, Tuberculosis Cardiovascular Patient has history of Congestive Heart Failure, Hypertension, Peripheral Arterial Disease Denies history of Angina, Arrhythmia, Coronary Artery Disease, Deep Vein Thrombosis, Hypotension, Myocardial Infarction, Peripheral Venous Disease, Phlebitis, Vasculitis Gastrointestinal Denies history of Cirrhosis , Colitis, Crohn s, Hepatitis A, Hepatitis B, Hepatitis C Endocrine Denies history of Type I Diabetes, Type II Diabetes Genitourinary Denies history of End Stage Renal Disease Immunological Denies history of Lupus Erythematosus, Raynaud s, Scleroderma Integumentary (Skin) Denies history of History of Burn, History of pressure wounds Musculoskeletal Patient has history of Osteoarthritis Denies history of Gout, Rheumatoid Arthritis, Osteomyelitis Neurologic Patient has history of Neuropathy Denies history of Dementia, Quadriplegia, Paraplegia, Seizure Disorder Oncologic Denies history of Received Chemotherapy, Received Radiation Psychiatric Denies history of Anorexia/bulimia, Confinement Anxiety Review of Systems (ROS) Constitutional Symptoms (General Health) Denies complaints or symptoms of Fatigue, Fever, Chills, Marked Weight Change. Eyes Denies complaints or symptoms of Dry Eyes, Vision Changes, Glasses / Contacts. Ear/Nose/Mouth/Throat Denies complaints or symptoms of Difficult clearing ears, Sinusitis. Hematologic/Lymphatic Denies complaints or symptoms of Bleeding / Clotting Disorders, Human Immunodeficiency Virus. Respiratory Denies complaints or symptoms of Chronic or frequent coughs, Shortness of Breath. Cardiovascular AMELIYAH, SARNO ([id number]) Complains or has symptoms of LE edema. Denies complaints or symptoms of Chest  pain. Gastrointestinal Denies complaints or symptoms of Frequent diarrhea, Nausea, Vomiting. Endocrine Denies complaints or symptoms of Hepatitis, Thyroid disease, Polydypsia (Excessive Thirst). Genitourinary Denies complaints or symptoms of Kidney failure/ Dialysis, Incontinence/dribbling. Immunological Denies complaints or symptoms of Hives, Itching. Integumentary (Skin) Complains or has symptoms of Wounds. Denies complaints or symptoms of Bleeding or bruising tendency, Breakdown, Swelling. Musculoskeletal Denies complaints or symptoms of Muscle Pain, Muscle Weakness. Neurologic Denies complaints or symptoms of Numbness/parasthesias, Focal/Weakness. Psychiatric Denies complaints or symptoms of Anxiety, Claustrophobia. Objective Constitutional sitting or standing blood pressure is within target range for patient.. pulse regular and within target range for patient.Marland Kitchen respirations regular, non- labored and within target range for patient.Marland Kitchen temperature within target range for patient.. Well-nourished and well-hydrated in no acute distress. Vitals Time Taken: 1:08 PM, Height: 62 in, Source: Stated, Weight: 150 lbs, Source: Measured, BMI: 27.4, Temperature: 98.4 F, Pulse: 90 bpm, Respiratory Rate: 18 breaths/min, Blood Pressure: 134/79 mmHg. Eyes conjunctiva clear no eyelid edema noted. pupils equal round and  reactive to light and accommodation. Ears, Nose, Mouth, and Throat no gross abnormality of ear auricles or external auditory canals. normal hearing noted during conversation. mucus membranes moist. Respiratory normal breathing without difficulty. Cardiovascular 2+ dorsalis pedis/posterior tibialis pulses. no clubbing, cyanosis, significant edema, Musculoskeletal normal gait and posture. no significant deformity or arthritic changes, no loss or range of motion, no clubbing. Psychiatric this patient is able to make decisions and demonstrates good insight into disease process. Alert  and Oriented x 3. pleasant and cooperative. General Notes: Upon inspection patient's wound bed actually showed signs of some good granulation though surface was not completely healthy and where I like to see it but nonetheless did not appear to be too severe either. Fortunately there does not appear to be any signs of active infection systemically either which is also great news. She has an excellent ABI at 1. In general I think that she is doing well all things considered. Integumentary (Hair, Skin) Wound #7 status is Open. Original cause of wound was Gradually Appeared. The date acquired was: 05/12/2021. The wound is located on the Right,Lateral Malleolus. The wound measures 1.3cm length x 1.1cm width x 0.2cm depth; 1.123cm^2 area and 0.225cm^3 volume. There is Fat Layer (Subcutaneous Tissue) exposed. There is no tunneling or undermining noted. There is a medium amount of sanguinous drainage noted. The wound margin is distinct with the outline attached to the wound base. There is small (1-33%) red granulation within the wound bed. There is a large (67-100%) amount of necrotic tissue within the wound bed including Eschar and Adherent Slough. Assessment Active Problems ICD-10 Pressure ulcer of right ankle, stage 3 Lymphedema, not elsewhere classified [name], [name] [name] ([id number]) Venous insufficiency (chronic) (peripheral) Presence of cardiac pacemaker Long term (current) use of anticoagulants Plan Follow-up Appointments: Return Appointment in 1 week. Home Health: [facility] for wound care. May utilize formulary equivalent dressing for wound treatment orders unless otherwise specified. Home Health Nurse may visit [facility] to address patient s wound care needs. - Center Well Bathing/ Shower/ Hygiene: May shower with wound dressing protected with water repellent cover or cast protector. Edema Control - Lymphedema / Segmental Compressive Device / Other: Optional: One layer of unna paste  to top of compression wrap (to act as an anchor). Elevate, Exercise Daily and Avoid Standing for Long Periods of Time. Elevate legs to the level of the heart and pump ankles as often as possible Elevate leg(s) parallel to the floor when sitting. The following medication(s) was prescribed: gentamicin topical 0.1 % cream cream topical applied to the wound bed with each wrap change 2 times per week as directed in the clinic for 30 days WOUND #7: - Malleolus Wound Laterality: Right, Lateral Cleanser: Normal Saline 2 x Per Week/30 Days Discharge Instructions: Wash your hands with soap and water. Remove old dressing, discard into plastic bag and place into trash. Cleanse the wound with Normal Saline prior to applying a clean dressing using gauze sponges, not tissues or cotton balls. Do not scrub or use excessive force. Pat dry using gauze sponges, not tissue or cotton balls. Cleanser: Soap and Water 2 x Per Week/30 Days Discharge Instructions: Gently cleanse wound with antibacterial soap, rinse and pat dry prior to dressing wounds Primary Dressing: Hydrofera Blue Ready Transfer Foam, 2.5x2.5 (in/in) 2 x Per Week/30 Days Discharge Instructions: Apply Hydrofera Blue Ready to wound bed as directed Secondary Dressing: ABD Pad 5x9 (in/in) 2 x Per Week/30 Days Discharge Instructions: Cover with ABD pad Compression Wrap: Profore  Lite LF 3 Multilayer Compression Bandaging System 2 x Per Week/30 Days Discharge Instructions: Apply 3 multi-layer wrap as prescribed. 1. Would recommend currently that we go ahead and initiate treatment with a topical gentamicin cream applied in a thin film over the wound bed. She is in agreement with this plan as is her daughter. 2. I am also can recommend that we use Hydrofera Blue as the dressing of choice over top of this. 3. I am also can recommend that we have the patient go ahead and initiate treatment with a 3 layer compression wrap. This will allow [place] to get some of the  fluid out of her leg and hopefully allow her to more appropriately heal. We will see patient back for reevaluation in 1 week here in the clinic. If anything worsens or changes patient will contact our office for additional recommendations. Electronic Signature(s) Signed: 05/13/2021 2:07:34 PM By: Worthy Keeler PA-C Entered By: Worthy Keeler on 05/13/2021 14:07:34 [name], [name] ([id number]) -------------------------------------------------------------------------------- ROS/PFSH Details Patient Name: [name] Date of Service: 05/13/2021 12:45 PM Medical Record Number: [id number] Patient Account Number: [id number] Date of Birth/Sex: 29-Jul-1923 (87 y.o. F) Treating RN: Dolan Amen Primary Care Provider: Adrian Prows Other Clinician: Referring Provider: Adrian Prows Treating Provider/Extender: Skipper Cliche in Treatment: 0 Information Obtained From Patient Constitutional Symptoms (General Health) Complaints and Symptoms: Negative for: Fatigue; Fever; Chills; Marked Weight Change Eyes Complaints and Symptoms: Negative for: Dry Eyes; Vision Changes; Glasses / Contacts Medical History: Positive for: Cataracts - surgery Negative for: Glaucoma; Optic Neuritis Ear/Nose/Mouth/Throat Complaints and Symptoms: Negative for: Difficult clearing ears; Sinusitis Medical History: Negative for: Chronic sinus problems/congestion; Middle ear problems Hematologic/Lymphatic Complaints and Symptoms: Negative for: Bleeding / Clotting Disorders; Human Immunodeficiency Virus Medical History: Negative for: Anemia; Hemophilia; Human Immunodeficiency Virus; Lymphedema; Sickle Cell Disease Respiratory Complaints and Symptoms: Negative for: Chronic or frequent coughs; Shortness of Breath Medical History: Negative for: Aspiration; Asthma; Chronic Obstructive Pulmonary Disease (COPD); Pneumothorax; Sleep Apnea; Tuberculosis Cardiovascular Complaints and Symptoms: Positive  for: LE edema Negative for: Chest pain Medical History: Positive for: Congestive Heart Failure; Hypertension; Peripheral Arterial Disease Negative for: Angina; Arrhythmia; Coronary Artery Disease; Deep Vein Thrombosis; Hypotension; Myocardial Infarction; Peripheral Venous Disease; Phlebitis; Vasculitis Gastrointestinal Complaints and Symptoms: Negative for: Frequent diarrhea; Nausea; Vomiting Medical History: Negative for: Cirrhosis ; Colitis; Crohnos; Hepatitis A; Hepatitis B; Hepatitis C Endocrine [place], [name] ([id number]) Complaints and Symptoms: Negative for: Hepatitis; Thyroid disease; Polydypsia (Excessive Thirst) Medical History: Negative for: Type I Diabetes; Type II Diabetes Genitourinary Complaints and Symptoms: Negative for: Kidney failure/ Dialysis; Incontinence/dribbling Medical History: Negative for: End Stage Renal Disease Immunological Complaints and Symptoms: Negative for: Hives; Itching Medical History: Negative for: Lupus Erythematosus; Raynaudos; Scleroderma Integumentary (Skin) Complaints and Symptoms: Positive for: Wounds Negative for: Bleeding or bruising tendency; Breakdown; Swelling Medical History: Negative for: History of Burn; History of pressure wounds Musculoskeletal Complaints and Symptoms: Negative for: Muscle Pain; Muscle Weakness Medical History: Positive for: Osteoarthritis Negative for: Gout; Rheumatoid Arthritis; Osteomyelitis Neurologic Complaints and Symptoms: Negative for: Numbness/parasthesias; Focal/Weakness Medical History: Positive for: Neuropathy Negative for: Dementia; Quadriplegia; Paraplegia; Seizure Disorder Psychiatric Complaints and Symptoms: Negative for: Anxiety; Claustrophobia Medical History: Negative for: Anorexia/bulimia; Confinement Anxiety Oncologic Medical History: Negative for: Received Chemotherapy; Received Radiation HBO Extended History Items Eyes: Cataracts Immunizations Pneumococcal  Vaccine: Received Pneumococcal Vaccination: Yes ENOLIA, KOEPKE ([id number]) Implantable Devices Yes Family and Social History Cancer: Yes - Father,Siblings; Diabetes: No; Heart Disease: Yes - Siblings; Hereditary Spherocytosis: No; Hypertension: No;  Kidney Disease: No; Lung Disease: No; Seizures: No; Stroke: No; Thyroid Problems: No; Tuberculosis: No; Never smoker; Marital Status - Widowed; Alcohol Use: Never; Drug Use: No History; Caffeine Use: Rarely; Financial Concerns: No; Food, Clothing or Shelter Needs: No; Support System Lacking: No; Transportation Concerns: No Electronic Signature(s) Signed: 05/13/2021 4:59:55 PM By: Georges Mouse, Minus Breeding RN Signed: 05/13/2021 5:52:53 PM By: Worthy Keeler PA-C Entered By: Georges Mouse, Minus Breeding on 05/13/2021 13:54:20 Theiss, [name] ([id number]) -------------------------------------------------------------------------------- SuperBill Details Patient Name: [name] Date of Service: 05/13/2021 Medical Record Number: [id number] Patient Account Number: [id number] Date of Birth/Sex: 14-Oct-1923 (87 y.o. F) Treating RN: Carlene Coria Primary Care Provider: Adrian Prows Other Clinician: Referring Provider: Adrian Prows Treating Provider/Extender: Skipper Cliche in Treatment: 0 Diagnosis Coding ICD-10 Codes Code Description [phone] Pressure ulcer of right ankle, stage 3 I89.0 Lymphedema, not elsewhere classified I87.2 Venous insufficiency (chronic) (peripheral) Z95.0 Presence of cardiac pacemaker Z79.01 Long term (current) use of anticoagulants Facility Procedures CPT4 Code: [id number] Description: (Facility Use Only) [phone] - Wylie LWR RT LEG Modifier: Quantity: 1 Physician Procedures CPT4 Code: [id number] Description: 36629 - WC PHYS LEVEL 4 - EST PT Modifier: Quantity: 1 CPT4 Code: Description: ICD-10 Diagnosis Description 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, stage 3 I89.0 Lymphedema, not  elsewhere classified I87.2 Venous insufficiency (chronic) (peripheral) Z95.0 Presence of cardiac pacemaker Modifier: Quantity: Electronic Signature(s) Signed: 05/13/2021 4:40:55 PM By: Carlene Coria RN Signed: 05/13/2021 5:52:53 PM By: Worthy Keeler PA-C Previous Signature: 05/13/2021 2:07:45 PM Version By: Worthy Keeler PA-C Entered By: Carlene Coria on 05/13/2021 16:05:03

## 2021-05-13 NOTE — Progress Notes (Signed)
KOURTLYNN, TREVOR ([id number]) Visit Report for 05/13/2021 Abuse/Suicide Risk Screen Details Patient Name: [name], [name]. Date of Service: 05/13/2021 12:45 PM Medical Record Number: [id number] Patient Account Number: [id number] Date of Birth/Sex: 12-29-1922 (87 y.o. Female) Treating RN: Dolan Amen Primary Care Lerae Langham: Adrian Prows Other Clinician: Referring Jahira Swiss: Adrian Prows Treating Jamauri Kruzel/Extender: Skipper Cliche in Treatment: 0 Abuse/Suicide Risk Screen Items Answer ABUSE RISK SCREEN: Has anyone close to you tried to hurt or harm you recentlyo No Do you feel uncomfortable with anyone in your familyo No Has anyone forced you do things that you didnot want to doo No Electronic Signature(s) Signed: 05/13/2021 4:59:55 PM By: Georges Mouse, Minus Breeding RN Entered By: Georges Mouse, Kenia on 05/13/2021 13:12:07 Fetty, [name] ([id number]) -------------------------------------------------------------------------------- Activities of Daily Living Details Patient Name: [name] Date of Service: 05/13/2021 12:45 PM Medical Record Number: [id number] Patient Account Number: [id number] Date of Birth/Sex: 12/20/1922 (87 y.o. Female) Treating RN: Dolan Amen Primary Care Nataki Mccrumb: Adrian Prows Other Clinician: Referring Raya Mckinstry: Adrian Prows Treating Jaxyn Rout/Extender: Skipper Cliche in Treatment: 0 Activities of Daily Living Items Answer Activities of Daily Living (Please select one for each item) Drive Automobile Not Able Take Medications Need Assistance Use Telephone Completely Able Care for Appearance Completely Able Use Toilet Completely Able Bath / Shower Completely Able Dress Self Completely Able Feed Self Completely Able Walk Completely Able Get In / Out Bed Completely Able Housework Completely Able Prepare Meals Completely [place] for Self Completely Able Electronic  Signature(s) Signed: 05/13/2021 4:59:55 PM By: Georges Mouse, Minus Breeding RN Entered By: Georges Mouse, Minus Breeding on 05/13/2021 13:13:22 [name] -------------------------------------------------------------------------------- Education Screening Details Patient Name: [name] Date of Service: 05/13/2021 12:45 PM Medical Record Number: [id number] Patient Account Number: [id number] Date of Birth/Sex: 11-Jan-1923 (87 y.o. Female) Treating RN: Dolan Amen Primary Care Arlynn Stare: Adrian Prows Other Clinician: Referring Guila Owensby: Adrian Prows Treating Latoiya Maradiaga/Extender: Skipper Cliche in Treatment: 0 Primary Learner Assessed: Patient Learning Preferences/Education Level/Primary Language Learning Preference: Explanation, Demonstration Highest Education Level: College or Above Preferred Language: English Cognitive Barrier Language Barrier: No Translator Needed: No Memory Deficit: No Emotional Barrier: No Cultural/Religious Beliefs Affecting Medical Care: No Physical Barrier Impaired Vision: No Impaired Hearing: No Decreased Hand dexterity: No Knowledge/Comprehension Knowledge Level: Medium Comprehension Level: Medium Ability to understand written instructions: Medium Ability to understand verbal instructions: Medium Motivation Anxiety Level: Calm Cooperation: Cooperative Education Importance: Acknowledges Need Interest in Health Problems: Asks Questions Perception: Coherent Willingness to Engage in Self-Management Medium Activities: Readiness to Engage in Self-Management Medium Activities: Electronic Signature(s) Signed: 05/13/2021 4:59:55 PM By: Georges Mouse, Minus Breeding RN Entered By: Georges Mouse, Minus Breeding on 05/13/2021 13:13:49 Alan, [name] ([id number]) -------------------------------------------------------------------------------- Fall Risk Assessment Details Patient Name: [name] Date of Service: 05/13/2021 12:45  PM Medical Record Number: [id number] Patient Account Number: [id number] Date of Birth/Sex: 1923/09/14 (87 y.o. Female) Treating RN: Dolan Amen Primary Care Stellan Vick: Adrian Prows Other Clinician: Referring Tanzie Rothschild: Adrian Prows Treating Julus Kelley/Extender: Skipper Cliche in Treatment: 0 Fall Risk Assessment Items Have you had 2 or more falls in the last 12 monthso 0 No Have you had any fall that resulted in injury in the last 12 monthso 0 No FALLS RISK SCREEN History of falling - immediate or within 3 months 0 No Secondary diagnosis (Do you have 2 or more medical diagnoseso) 15 Yes Ambulatory aid None/bed rest/wheelchair/nurse 0 No Crutches/cane/walker 15 Yes Furniture 0 No Intravenous therapy Access/Saline/Heparin Lock 0 No Gait/Transferring Normal/ bed rest/  wheelchair 0 Yes Weak (short steps with or without shuffle, stooped but able to lift head while walking, may 0 No seek support from furniture) Impaired (short steps with shuffle, may have difficulty arising from chair, head down, impaired 0 No balance) Mental Status Oriented to own ability 0 Yes Electronic Signature(s) Signed: 05/13/2021 4:59:55 PM By: Georges Mouse, Minus Breeding RN Entered By: Georges Mouse, Kenia on 05/13/2021 13:14:40 Boldman, [name] ([id number]) -------------------------------------------------------------------------------- Foot Assessment Details Patient Name: [name] Date of Service: 05/13/2021 12:45 PM Medical Record Number: [id number] Patient Account Number: [id number] Date of Birth/Sex: 1923/03/15 (87 y.o. Female) Treating RN: Dolan Amen Primary Care Patsye Sullivant: Adrian Prows Other Clinician: Referring Kimyetta Flott: Adrian Prows Treating Mahmud Keithly/Extender: Skipper Cliche in Treatment: 0 Foot Assessment Items Site Locations + = Sensation present, - = Sensation absent, C = Callus, U = Ulcer R = Redness, W = Warmth, M = Maceration, PU = Pre-ulcerative  lesion F = Fissure, S = Swelling, D = Dryness Assessment Right: Left: Other Deformity: No No Prior Foot Ulcer: No No Prior Amputation: No No Charcot Joint: No No Ambulatory Status: Ambulatory With Help Assistance Device: Walker Gait: Steady Electronic Signature(s) Signed: 05/13/2021 4:59:55 PM By: Georges Mouse, Minus Breeding RN Entered By: Georges Mouse, Minus Breeding on 05/13/2021 13:17:05 Ortez, [name] ([id number]) -------------------------------------------------------------------------------- Nutrition Risk Screening Details Patient Name: [name] Date of Service: 05/13/2021 12:45 PM Medical Record Number: [id number] Patient Account Number: [id number] Date of Birth/Sex: [birth year] (87 y.o. Female) Treating RN: Dolan Amen Primary Care Amzie Sillas: Adrian Prows Other Clinician: Referring Kioni Stahl: Adrian Prows Treating Ragan Reale/Extender: Skipper Cliche in Treatment: 0 Height (in): 62 Weight (lbs): 150 Body Mass Index (BMI): 27.4 Nutrition Risk Screening Items Score Screening NUTRITION RISK SCREEN: I have an illness or condition that made me change the kind and/or amount of food I eat 0 No I eat fewer than two meals per day 0 No I eat few fruits and vegetables, or milk products 0 No I have three or more drinks of beer, liquor or wine almost every day 0 No I have tooth or mouth problems that make it hard for me to eat 0 No I don't always have enough money to buy the food I need 0 No I eat alone most of the time 0 No I take three or more different prescribed or over-the-counter drugs a day 1 Yes Without wanting to, I have lost or gained 10 pounds in the last six months 0 No I am not always physically able to shop, cook and/or feed myself 0 No Nutrition Protocols Good Risk Protocol 0 No interventions needed Moderate Risk Protocol High Risk Proctocol Risk Level: Good Risk Score: 1 Electronic Signature(s) Signed: 05/13/2021 4:59:55 PM By: Georges Mouse,  Minus Breeding RN Entered By: Georges Mouse, Minus Breeding on 05/13/2021 13:15:14

## 2021-05-13 NOTE — Progress Notes (Signed)
[name], [name] ([id number]) Visit Report for 05/13/2021 Allergy List Details Patient Name: [name], [name]. Date of Service: 05/13/2021 12:45 PM Medical Record Number: [id number] Patient Account Number: [id number] Date of Birth/Sex: 07-01-1923 (87 y.o. Female) Treating RN: Dolan Amen Primary Care Parissa Chiao: Adrian Prows Other Clinician: Referring Manvi Guilliams: Adrian Prows Treating Espen Bethel/Extender: Jeri Cos Weeks in Treatment: 0 Allergies Active Allergies Sulfa (Sulfonamide Antibiotics) metronidazole monistat Type: Medication Allergy Notes Electronic Signature(s) Signed: 05/13/2021 4:59:55 PM By: Georges Mouse, Minus Breeding RN Entered By: Georges Mouse, Minus Breeding on 05/13/2021 13:10:04 Benning, [name] ([id number]) -------------------------------------------------------------------------------- Arrival Information Details Patient Name: [name] Date of Service: 05/13/2021 12:45 PM Medical Record Number: [id number] Patient Account Number: [id number] Date of Birth/Sex: 10-Nov-1923 (87 y.o. Female) Treating RN: Dolan Amen Primary Care Rameen Gohlke: Adrian Prows Other Clinician: Referring Aylee Littrell: Adrian Prows Treating Zakary Kimura/Extender: Skipper Cliche in Treatment: 0 Visit Information Patient Arrived: Charlyn Minerva Time: 13:07 Accompanied By: daughter Transfer Assistance: None Patient Identification Verified: Yes Secondary Verification Process Completed: Yes Patient Has Alerts: Yes Patient Alerts: Patient on Blood Thinner NOT DIABETIC ***ELIQUIS*** History Since Last Visit Electronic Signature(s) Signed: 05/13/2021 4:59:55 PM By: Georges Mouse, Minus Breeding RN Entered By: Georges Mouse, Kenia on 05/13/2021 13:35:41 Matheson, [name] ([id number]) -------------------------------------------------------------------------------- Clinic Level of Care Assessment Details Patient Name: [name] Date of Service: 05/13/2021 12:45  PM Medical Record Number: [id number] Patient Account Number: [id number] Date of Birth/Sex: [birth year] (87 y.o. Female) Treating RN: Carlene Coria Primary Care Armya Westerhoff: Adrian Prows Other Clinician: Referring Haaris Metallo: Adrian Prows Treating Kazuko Clemence/Extender: Skipper Cliche in Treatment: 0 Clinic Level of Care Assessment Items TOOL 1 Quantity Score X - Use when EandM and Procedure is performed on INITIAL visit 1 0 ASSESSMENTS - Nursing Assessment / Reassessment X - General Physical Exam (combine w/ comprehensive assessment (listed just below) when performed on new 1 20 pt. evals) X- 1 25 Comprehensive Assessment (HX, ROS, Risk Assessments, Wounds Hx, etc.) ASSESSMENTS - Wound and Skin Assessment / Reassessment _0  - Dermatologic / Skin Assessment (not related to wound area) 0 ASSESSMENTS - Ostomy and/or Continence Assessment and Care _1  - Incontinence Assessment and Management 0 _2  - 0 Ostomy Care Assessment and Management (repouching, etc.) PROCESS - Coordination of Care X - Simple Patient / Family Education for ongoing care 1 15 _3  - 0 Complex (extensive) Patient / Family Education for ongoing care X- 1 10 Staff obtains Programmer, systems, Records, Test Results / Process Orders _4  - 0 Staff telephones HHA, Nursing Homes / Clarify orders / etc _5  - 0 Routine Transfer to another Facility (non-emergent condition) _6  - 0 Routine Hospital Admission (non-emergent condition) X- 1 15 New Admissions / Biomedical engineer / Ordering NPWT, Apligraf, etc. _7  - 0 Emergency Hospital Admission (emergent condition) PROCESS - Special Needs _8  - Pediatric / Minor Patient Management 0 _9  - 0 Isolation Patient Management _10  - 0 Hearing / Language / Visual special needs _11  - 0 Assessment of Community assistance (transportation, D/C planning, etc.) _12  - 0 Additional assistance / Altered mentation _13  - 0 Support Surface(s) Assessment (bed, cushion, seat, etc.) INTERVENTIONS -  Miscellaneous _14  - External ear exam 0 _15  - 0 Patient Transfer (multiple staff / Civil Service fast streamer / Similar devices) _16  - 0 Simple Staple / Suture removal (25 or less) _17  - 0 Complex Staple / Suture removal (26 or more) _18  - 0 Hypo/Hyperglycemic Management (do not check if billed separately) X- 1 15 Ankle / Brachial Index (ABI) - do not check if billed separately Has the patient been  seen at the hospital within the last three years: Yes Total Score: 100 Level Of Care: New/Established - Level 3 YANITZA, SHVARTSMAN ([id number]) Electronic Signature(s) Signed: 05/13/2021 4:40:55 PM By: Carlene Coria RN Entered By: Carlene Coria on 05/13/2021 16:04:44 Brose, [name] ([id number]) -------------------------------------------------------------------------------- Compression Therapy Details Patient Name: [name] Date of Service: 05/13/2021 12:45 PM Medical Record Number: [id number] Patient Account Number: [id number] Date of Birth/Sex: [birth year] (87 y.o. Female) Treating RN: Carlene Coria Primary Care Rhylynn Perdomo: Adrian Prows Other Clinician: Referring Mika Griffitts: Adrian Prows Treating Cordale Manera/Extender: Skipper Cliche in Treatment: 0 Compression Therapy Performed for Wound Assessment: Wound #7 Right,Lateral Malleolus Performed By: Jake Church, RN Compression Type: Three Layer Post Procedure Diagnosis Same as Pre-procedure Electronic Signature(s) Signed: 05/13/2021 4:40:55 PM By: Carlene Coria RN Entered By: Carlene Coria on 05/13/2021 16:03:55 Anger, [name] ([id number]) -------------------------------------------------------------------------------- Encounter Discharge Information Details Patient Name: [name] Date of Service: 05/13/2021 12:45 PM Medical Record Number: [id number] Patient Account Number: [id number] Date of Birth/Sex: 1922-12-16 (87 y.o. Female) Treating RN: Donnamarie Poag Primary Care Desarie Feild: Adrian Prows Other  Clinician: Referring Deiontae Rabel: Adrian Prows Treating Lota Leamer/Extender: Skipper Cliche in Treatment: 0 Encounter Discharge Information Items Discharge Condition: Stable Ambulatory Status: Walker Discharge Destination: Home Transportation: Private Auto Accompanied By: daughter Schedule Follow-up Appointment: Yes Clinical Summary of Care: Electronic Signature(s) Signed: 05/13/2021 5:10:42 PM By: Donnamarie Poag Entered By: Donnamarie Poag on 05/13/2021 14:17:55 Peregoy, [name] ([id number]) -------------------------------------------------------------------------------- Lower Extremity Assessment Details Patient Name: [name] Date of Service: 05/13/2021 12:45 PM Medical Record Number: [id number] Patient Account Number: [id number] Date of Birth/Sex: [birth year] (87 y.o. Female) Treating RN: Dolan Amen Primary Care Cuyler Vandyken: Adrian Prows Other Clinician: Referring Adda Stokes: Adrian Prows Treating Nylee Barbuto/Extender: Skipper Cliche in Treatment: 0 Edema Assessment Assessed: Shirlyn Goltz: No] [Right: Yes] Edema: [Left: Ye] [Right: s] Calf Left: Right: Point of Measurement: 30 cm From Medial Instep 28.6 cm Ankle Left: Right: Point of Measurement: 10 cm From Medial Instep 21 cm Knee To Floor Left: Right: From Medial Instep 39 cm Vascular Assessment Pulses: Dorsalis Pedis Palpable: [Right:Yes] Doppler Audible: [Right:Yes] Posterior Tibial Palpable: [Right:Yes] Doppler Audible: [Right:Yes] Blood Pressure: Brachial: [Right:100] Dorsalis Pedis: 100 Ankle: Posterior Tibial: 88 Ankle Brachial Index: [Right:1.00] Electronic Signature(s) Signed: 05/13/2021 4:59:55 PM By: Georges Mouse, Minus Breeding RN Entered By: Georges Mouse, Kenia on 05/13/2021 13:35:19 Volkert, [name] ([id number]) -------------------------------------------------------------------------------- Multi Wound Chart Details Patient Name: [name] Date of Service: 05/13/2021 12:45  PM Medical Record Number: [id number] Patient Account Number: [id number] Date of Birth/Sex: 11-Nov-1923 (87 y.o. Female) Treating RN: Carlene Coria Primary Care Orren Pietsch: Adrian Prows Other Clinician: Referring Gabriela Giannelli: Adrian Prows Treating Kasem Mozer/Extender: Skipper Cliche in Treatment: 0 Vital Signs Height(in): 62 Pulse(bpm): 90 Weight(lbs): 150 Blood Pressure(mmHg): 134/79 Body Mass Index(BMI): 27 Temperature(F): 98.4 Respiratory Rate(breaths/min): 18 Photos: [N/A:N/A] Wound Location: Right, Lateral Malleolus N/A N/A Wounding Event: Gradually Appeared N/A N/A Primary Etiology: Pressure Ulcer N/A N/A Comorbid History: Cataracts, Congestive Heart Failure, N/A N/A Hypertension, Peripheral Arterial Disease, Osteoarthritis, Neuropathy Date Acquired: 05/12/2021 N/A N/A Weeks of Treatment: 0 N/A N/A Wound Status: Open N/A N/A Measurements L x W x D (cm) 1.3x1.1x0.2 N/A N/A Area (cm) : 1.123 N/A N/A Volume (cm) : 0.225 N/A N/A % Reduction in Area: 0.00% N/A N/A % Reduction in Volume: 0.00% N/A N/A Classification: Category/Stage III N/A N/A Exudate Amount: Medium N/A N/A Exudate Type: Sanguinous N/A N/A Exudate Color: red N/A N/A Wound Margin: Distinct, outline attached N/A N/A Granulation Amount: Small (1-33%) N/A N/A Granulation  Quality: Red N/A N/A Necrotic Amount: Large (67-100%) N/A N/A Necrotic Tissue: Eschar, Adherent Slough N/A N/A Exposed Structures: Fat Layer (Subcutaneous Tissue): N/A N/A Yes Fascia: No Tendon: No Muscle: No Joint: No Bone: No Epithelialization: None N/A N/A Treatment Notes Wound #7 (Malleolus) Wound Laterality: Right, Lateral Cleanser Normal Saline Discharge Instruction: Wash your hands with soap and water. Remove old dressing, discard into plastic bag and place into trash. Cleanse the wound with Normal Saline prior to applying a clean dressing using gauze sponges, not tissues or cotton balls. Do not Ocon, [name]  ([id number]) scrub or use excessive force. Pat dry using gauze sponges, not tissue or cotton balls. Soap and Water Discharge Instruction: Gently cleanse wound with antibacterial soap, rinse and pat dry prior to dressing wounds Peri-Wound Care Topical Primary Dressing Hydrofera Blue Ready Transfer Foam, 2.5x2.5 (in/in) Discharge Instruction: Apply Hydrofera Blue Ready to wound bed as directed Secondary Dressing ABD Pad 5x9 (in/in) Discharge Instruction: Cover with ABD pad Secured With Compression Wrap Profore Lite LF 3 Multilayer Compression Gibson Discharge Instruction: Apply 3 multi-layer wrap as prescribed. Compression Stockings Add-Ons Electronic Signature(s) Signed: 05/13/2021 4:40:55 PM By: Carlene Coria RN Entered By: Carlene Coria on 05/13/2021 16:02:34 Muhs, [name] ([id number]) -------------------------------------------------------------------------------- [place] Details Patient Name: [name] Date of Service: 05/13/2021 12:45 PM Medical Record Number: [id number] Patient Account Number: [id number] Date of Birth/Sex: 1923/04/21 (87 y.o. Female) Treating RN: Carlene Coria Primary Care Yarieliz Wasser: Adrian Prows Other Clinician: Referring Maggi Hershkowitz: Adrian Prows Treating Vernesha Talbot/Extender: Skipper Cliche in Treatment: 0 Active Inactive Abuse / Safety / Falls / Self Care Management Nursing Diagnoses: Potential for injury related to falls Goals: Patient will remain injury free related to falls Date Initiated: 05/13/2021 Target Resolution Date: 06/12/2021 Goal Status: Active Patient/caregiver will demonstrate safe use of adaptive devices to increase mobility Date Initiated: 05/13/2021 Target Resolution Date: 06/12/2021 Goal Status: Active Interventions: Assess Activities of Daily Living upon admission and as needed Assess fall risk on admission and as needed Assess: immobility, friction, shearing, incontinence upon  admission and as needed Assess impairment of mobility on admission and as needed per policy Assess personal safety and home safety (as indicated) on admission and as needed Assess self care needs on admission and as needed Notes: Wound/Skin Impairment Nursing Diagnoses: Knowledge deficit related to ulceration/compromised skin integrity Goals: Patient/caregiver will verbalize understanding of skin care regimen Date Initiated: 05/13/2021 Date Inactivated: 05/13/2021 Target Resolution Date: 06/12/2021 Goal Status: Met Ulcer/skin breakdown will have a volume reduction of 30% by week 4 Date Initiated: 05/13/2021 Target Resolution Date: 06/12/2021 Goal Status: Active Ulcer/skin breakdown will have a volume reduction of 50% by week 8 Date Initiated: 05/13/2021 Target Resolution Date: 07/13/2021 Goal Status: Active Ulcer/skin breakdown will have a volume reduction of 80% by week 12 Date Initiated: 05/13/2021 Target Resolution Date: 08/13/2021 Goal Status: Active Ulcer/skin breakdown will heal within 14 weeks Date Initiated: 05/13/2021 Target Resolution Date: 09/12/2021 Goal Status: Active Interventions: Assess patient/caregiver ability to obtain necessary supplies Assess patient/caregiver ability to perform ulcer/skin care regimen upon admission and as needed Assess ulceration(s) every visit Notes: DONNAMARIE, SHANKLES ([id number]) Electronic Signature(s) Signed: 05/13/2021 4:40:55 PM By: Carlene Coria RN Entered By: Carlene Coria on 05/13/2021 16:02:22 Stamour, [name] ([id number]) -------------------------------------------------------------------------------- Pain Assessment Details Patient Name: [name] Date of Service: 05/13/2021 12:45 PM Medical Record Number: [id number] Patient Account Number: [id number] Date of Birth/Sex: 1923/11/16 (87 y.o. Female) Treating RN: Dolan Amen Primary Care Kataleya Zaugg: Adrian Prows Other Clinician: Referring Taji Sather: Ola Spurr,  Shanon Brow Treating Veer Elamin/Extender: Jeri Cos Weeks in Treatment: 0 Active Problems Location of Pain Severity and Description of Pain Patient Has Paino No Site Locations Rate the pain. Current Pain Level: 0 Pain Management and Medication Current Pain Management: Electronic Signature(s) Signed: 05/13/2021 4:59:55 PM By: Georges Mouse, Minus Breeding RN Entered By: Georges Mouse, Minus Breeding on 05/13/2021 13:08:08 [name] ([id number]) -------------------------------------------------------------------------------- Patient/Caregiver Education Details Patient Name: [name] Date of Service: 05/13/2021 12:45 PM Medical Record Number: [id number] Patient Account Number: [id number] Date of Birth/Gender: November 01, 1923 (87 y.o. Female) Treating RN: Carlene Coria Primary Care Physician: Adrian Prows Other Clinician: Referring Physician: Adrian Prows Treating Physician/Extender: Skipper Cliche in Treatment: 0 Education Assessment Education Provided To: Patient Education Topics Provided Wound/Skin Impairment: Methods: Explain/Verbal Responses: State content correctly Electronic Signature(s) Signed: 05/13/2021 4:40:55 PM By: Carlene Coria RN Entered By: Carlene Coria on 05/13/2021 16:05:12 Duval, [name] ([id number]) -------------------------------------------------------------------------------- Wound Assessment Details Patient Name: [name] Date of Service: 05/13/2021 12:45 PM Medical Record Number: [id number] Patient Account Number: [id number] Date of Birth/Sex: 07-Apr-1923 (87 y.o. Female) Treating RN: Dolan Amen Primary Care Unnamed Zeien: Adrian Prows Other Clinician: Referring Shirlene Andaya: Adrian Prows Treating Halena Mohar/Extender: Skipper Cliche in Treatment: 0 Wound Status Wound Number: 7 Primary Pressure Ulcer Etiology: Wound Location: Right, Lateral Malleolus Wound Open Wounding Event: Gradually Appeared Status: Date Acquired:  05/12/2021 Comorbid Cataracts, Congestive Heart Failure, Hypertension, Weeks Of Treatment: 0 History: Peripheral Arterial Disease, Osteoarthritis, Neuropathy Clustered Wound: No Photos Wound Measurements Length: (cm) 1.3 Width: (cm) 1.1 Depth: (cm) 0.2 Area: (cm) 1.123 Volume: (cm) 0.225 % Reduction in Area: 0% % Reduction in Volume: 0% Epithelialization: None Tunneling: No Undermining: No Wound Description Classification: Category/Stage III Wound Margin: Distinct, outline attached Exudate Amount: Medium Exudate Type: Sanguinous Exudate Color: red Foul Odor After Cleansing: No Slough/Fibrino No Wound Bed Granulation Amount: Small (1-33%) Exposed Structure Granulation Quality: Red Fascia Exposed: No Necrotic Amount: Large (67-100%) Fat Layer (Subcutaneous Tissue) Exposed: Yes Necrotic Quality: Eschar, Adherent Slough Tendon Exposed: No Muscle Exposed: No Joint Exposed: No Bone Exposed: No Treatment Notes Wound #7 (Malleolus) Wound Laterality: Right, Lateral Cleanser Normal Saline Discharge Instruction: Wash your hands with soap and water. Remove old dressing, discard into plastic bag and place into trash. Cleanse the wound with Normal Saline prior to applying a clean dressing using gauze sponges, not tissues or cotton balls. Do not scrub or use excessive force. Pat dry using gauze sponges, not tissue or cotton balls. TYKERRIA, MCCUBBINS ([id number]) Soap and Water Discharge Instruction: Gently cleanse wound with antibacterial soap, rinse and pat dry prior to dressing wounds Peri-Wound Care Topical Primary Dressing Hydrofera Blue Ready Transfer Foam, 2.5x2.5 (in/in) Discharge Instruction: Apply Hydrofera Blue Ready to wound bed as directed Secondary Dressing ABD Pad 5x9 (in/in) Discharge Instruction: Cover with ABD pad Secured With Compression Wrap Profore Lite LF 3 Multilayer Compression [place] Discharge Instruction: Apply 3 multi-layer wrap as  prescribed. Compression Stockings Add-Ons Electronic Signature(s) Signed: 05/13/2021 1:48:30 PM By: Worthy Keeler PA-C Signed: 05/13/2021 4:59:55 PM By: Georges Mouse, Minus Breeding RN Entered By: Worthy Keeler on 05/13/2021 13:48:30 Lozon, [name] ([id number]) -------------------------------------------------------------------------------- Vitals Details Patient Name: [name] Date of Service: 05/13/2021 12:45 PM Medical Record Number: [id number] Patient Account Number: [id number] Date of Birth/Sex: 1923/07/22 (87 y.o. Female) Treating RN: Dolan Amen Primary Care Charlann Wayne: Adrian Prows Other Clinician: Referring Ponciano Shealy: Adrian Prows Treating Kayra Crowell/Extender: Skipper Cliche in Treatment: 0 Vital Signs Time Taken: 13:08 Temperature (F): 98.4 Height (in): 62 Pulse (bpm): 90  Source: Stated Respiratory Rate (breaths/min): 18 Weight (lbs): 150 Blood Pressure (mmHg): 134/79 Source: Measured Reference Range: 80 - 120 mg / dl Body Mass Index (BMI): 27.4 Electronic Signature(s) Signed: 05/13/2021 4:59:55 PM By: Georges Mouse, Minus Breeding RN Entered By: Georges Mouse, Minus Breeding on 05/13/2021 13:09:28

## 2021-05-18 ENCOUNTER — Other Ambulatory Visit: Payer: Self-pay

## 2021-05-18 DIAGNOSIS — L89513 Pressure ulcer of right ankle, stage 3: Secondary | ICD-10-CM | POA: Diagnosis not present

## 2021-05-23 NOTE — Progress Notes (Signed)
SIHAM, BUCARO ([id number]) Visit Report for 05/18/2021 Arrival Information Details Patient Name: [name], [name] Date of Service: 05/18/2021 2:45 PM Medical Record Number: [id number] Patient Account Number: [id number] Date of Birth/Sex: [birth year] (87 y.o. F) Treating RN: Donnamarie Poag Primary Care Aracelis Ulrey: Adrian Prows Other Clinician: Referring Jessalynn Mccowan: Adrian Prows Treating Drea Jurewicz/Extender: Tito Dine in Treatment: 0 Visit Information History Since Last Visit Added or deleted any medications: No Patient Arrived: [name] Had a fall or experienced change in No Arrival Time: 14:43 activities of daily living that may affect Accompanied By: daughter risk of falls: Transfer Assistance: EasyPivot Patient Lift Hospitalized since last visit: No Patient Identification Verified: Yes Has Dressing in Place as Prescribed: Yes Secondary Verification Process Completed: Yes Has Compression in Place as Prescribed: Yes Patient Has Alerts: Yes Pain Present Now: No Patient Alerts: Patient on Blood Thinner NOT DIABETIC ***ELIQUIS*** Electronic Signature(s) Signed: 05/23/2021 11:12:12 AM By: Donnamarie Poag Entered By: Donnamarie Poag on 05/18/2021 14:44:04 Mathurin, [name] ([id number]) -------------------------------------------------------------------------------- Clinic Level of Care Assessment Details Patient Name: [name] Date of Service: 05/18/2021 2:45 PM Medical Record Number: [id number] Patient Account Number: [id number] Date of Birth/Sex: 01/12/1923 (87 y.o. F) Treating RN: Donnamarie Poag Primary Care Khloey Chern: Adrian Prows Other Clinician: Referring Darleene Cumpian: Adrian Prows Treating Khai Torbert/Extender: Tito Dine in Treatment: 0 Clinic Level of Care Assessment Items TOOL 1 Quantity Score []  - Use when EandM and Procedure is performed on INITIAL visit 0 ASSESSMENTS - Nursing Assessment / Reassessment []  - General Physical Exam  (combine w/ comprehensive assessment (listed just below) when performed on new 0 pt. evals) []  - 0 Comprehensive Assessment (HX, ROS, Risk Assessments, Wounds Hx, etc.) ASSESSMENTS - Wound and Skin Assessment / Reassessment []  - Dermatologic / Skin Assessment (not related to wound area) 0 ASSESSMENTS - Ostomy and/or Continence Assessment and Care []  - Incontinence Assessment and Management 0 []  - 0 Ostomy Care Assessment and Management (repouching, etc.) PROCESS - Coordination of Care []  - Simple Patient / Family Education for ongoing care 0 []  - 0 Complex (extensive) Patient / Family Education for ongoing care []  - 0 Staff obtains Programmer, systems, Records, Test Results / Process Orders []  - 0 Staff telephones HHA, Nursing Homes / Clarify orders / etc []  - 0 Routine Transfer to another Facility (non-emergent condition) []  - 0 Routine Hospital Admission (non-emergent condition) []  - 0 New Admissions / Biomedical engineer / Ordering NPWT, Apligraf, etc. []  - 0 Emergency Hospital Admission (emergent condition) PROCESS - Special Needs []  - Pediatric / Minor Patient Management 0 []  - 0 Isolation Patient Management []  - 0 Hearing / Language / Visual special needs []  - 0 Assessment of Community assistance (transportation, D/C planning, etc.) []  - 0 Additional assistance / Altered mentation []  - 0 Support Surface(s) Assessment (bed, cushion, seat, etc.) INTERVENTIONS - Miscellaneous []  - External ear exam 0 []  - 0 Patient Transfer (multiple staff / Civil Service fast streamer / Similar devices) []  - 0 Simple Staple / Suture removal (25 or less) []  - 0 Complex Staple / Suture removal (26 or more) []  - 0 Hypo/Hyperglycemic Management (do not check if billed separately) []  - 0 Ankle / Brachial Index (ABI) - do not check if billed separately Has the patient been seen at the hospital within the last three years: Yes Total Score: 0 Level Of Care: ____ [name] ([id number]) Electronic  Signature(s) Signed: 05/23/2021 11:12:12 AM By: Donnamarie Poag Entered By: Donnamarie Poag on 05/18/2021 14:58:19 Cristina, [name] ([id number]) -------------------------------------------------------------------------------- Compression  Therapy Details Patient Name: [name], [name]. Date of Service: 05/18/2021 2:45 PM Medical Record Number: [id number] Patient Account Number: [id number] Date of Birth/Sex: [birth year] (87 y.o. F) Treating RN: Donnamarie Poag Primary Care Zai Chmiel: Adrian Prows Other Clinician: Referring Jerran Tappan: Adrian Prows Treating Benard Minturn/Extender: Tito Dine in Treatment: 0 Compression Therapy Performed for Wound Assessment: Wound #7 Right,Lateral Malleolus Performed By: Clinician Donnamarie Poag, RN Compression Type: Three Layer Electronic Signature(s) Signed: 05/23/2021 11:12:12 AM By: Donnamarie Poag Entered By: Donnamarie Poag on 05/18/2021 14:44:45 Mcpartlin, [name] ([id number]) -------------------------------------------------------------------------------- Encounter Discharge Information Details Patient Name: [name] Date of Service: 05/18/2021 2:45 PM Medical Record Number: [id number] Patient Account Number: [id number] Date of Birth/Sex: 1923-04-10 (87 y.o. F) Treating RN: Donnamarie Poag Primary Care Layloni Fahrner: Adrian Prows Other Clinician: Referring Genesis Paget: Adrian Prows Treating Stalin Gruenberg/Extender: Tito Dine in Treatment: 0 Encounter Discharge Information Items Discharge Condition: Stable Ambulatory Status: Walker Discharge Destination: Home Transportation: Private Auto Accompanied By: daughter Schedule Follow-up Appointment: Yes Clinical Summary of Care: Electronic Signature(s) Signed: 05/23/2021 11:12:12 AM By: Donnamarie Poag Entered By: Donnamarie Poag on 05/18/2021 14:58:13 Frericks, [name] ([id number]) -------------------------------------------------------------------------------- Wound Assessment  Details Patient Name: [name] Date of Service: 05/18/2021 2:45 PM Medical Record Number: [id number] Patient Account Number: [id number] Date of Birth/Sex: Dec 13, 1922 (87 y.o. F) Treating RN: Donnamarie Poag Primary Care Jamille Fisher: Adrian Prows Other Clinician: Referring Shakima Nisley: Adrian Prows Treating Shanele Nissan/Extender: Tito Dine in Treatment: 0 Wound Status Wound Number: 7 Primary Pressure Ulcer Etiology: Wound Location: Right, Lateral Malleolus Wound Open Wounding Event: Gradually Appeared Status: Date Acquired: 05/12/2021 Comorbid Cataracts, Congestive Heart Failure, Hypertension, Weeks Of Treatment: 0 History: Peripheral Arterial Disease, Osteoarthritis, Neuropathy Clustered Wound: No Wound Measurements Length: (cm) 1.3 Width: (cm) 1.1 Depth: (cm) 0.2 Area: (cm) 1.123 Volume: (cm) 0.225 % Reduction in Area: 0% % Reduction in Volume: 0% Epithelialization: None Wound Description Classification: Category/Stage III Wound Margin: Distinct, outline attached Exudate Amount: Medium Exudate Type: Sanguinous Exudate Color: red Foul Odor After Cleansing: No Slough/Fibrino No Wound Bed Granulation Amount: Small (1-33%) Exposed Structure Granulation Quality: Red Fascia Exposed: No Necrotic Amount: Large (67-100%) Fat Layer (Subcutaneous Tissue) Exposed: Yes Necrotic Quality: Eschar, Adherent Slough Tendon Exposed: No Muscle Exposed: No Joint Exposed: No Bone Exposed: No Treatment Notes Wound #7 (Malleolus) Wound Laterality: Right, Lateral Cleanser Normal Saline Discharge Instruction: Wash your hands with soap and water. Remove old dressing, discard into plastic bag and place into trash. Cleanse the wound with Normal Saline prior to applying a clean dressing using gauze sponges, not tissues or cotton balls. Do not scrub or use excessive force. Pat dry using gauze sponges, not tissue or cotton balls. Soap and Water Discharge Instruction:  Gently cleanse wound with antibacterial soap, rinse and pat dry prior to dressing wounds Peri-Wound Care Topical Primary Dressing Hydrofera Blue Ready Transfer Foam, 2.5x2.5 (in/in) Discharge Instruction: Apply Hydrofera Blue Ready to wound bed as directed Secondary Dressing ABD Pad 5x9 (in/in) Discharge Instruction: Cover with ABD pad Fukuda, [name] ([id number]) Secured With Compression Wrap Profore Lite LF 3 Multilayer Compression [place] Discharge Instruction: Apply 3 multi-layer wrap as prescribed. Compression Stockings Add-Ons Electronic Signature(s) Signed: 05/23/2021 11:12:12 AM By: Donnamarie Poag Entered ByDonnamarie Poag on 05/18/2021 14:44:25

## 2021-05-26 ENCOUNTER — Encounter: Payer: Medicare Other | Admitting: Physician Assistant

## 2021-05-26 ENCOUNTER — Other Ambulatory Visit: Payer: Self-pay

## 2021-05-26 DIAGNOSIS — L89513 Pressure ulcer of right ankle, stage 3: Secondary | ICD-10-CM | POA: Diagnosis not present

## 2021-05-26 NOTE — Progress Notes (Addendum)
OCIA, SIMEK ([id number]) Visit Report for 05/26/2021 Chief Complaint Document Details Patient Name: [name], [name]. Date of Service: 05/26/2021 3:45 PM Medical Record Number: [id number] Patient Account Number: [id number] Date of Birth/Sex: [birth year] (87 y.o. F) Treating RN: Dolan Amen Primary Care Provider: Adrian Prows Other Clinician: Referring Provider: Adrian Prows Treating Provider/Extender: Skipper Cliche in Treatment: 1 Information Obtained from: Patient Chief Complaint Right foot ulcer Electronic Signature(s) Signed: 05/26/2021 3:33:15 PM By: Worthy Keeler PA-C Entered By: Worthy Keeler on 05/26/2021 15:33:15 [name], [name] ([id number]) -------------------------------------------------------------------------------- HPI Details Patient Name: [name] Date of Service: 05/26/2021 3:45 PM Medical Record Number: [id number] Patient Account Number: [id number] Date of Birth/Sex: August 02, 1923 (87 y.o. 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 Dr. Amalia Hailey the podiatrist saw her on 07/20/2017 and sent her to the wound center for possible hyperbaric oxygen therapy. past medical history of peripheral vascular disease, varicose veins, status post appendectomy, basal cell carcinoma excision from the left leg, cholecystectomy, pacemaker placement, right lower extremity angiography done by Dr. dew in March 2017 with placement of a stent. there is also note of a successful ablation of the right small saphenous vein done which was reviewed by ultrasound on 10/24/2016. the  patient had a right small saphenous vein ablation done on 10/20/2016. The patient has never been a smoker. She has been seen by Dr. Corene Cornea dew the vascular surgeon who most recently saw her on 06/15/2017 for evaluation of ongoing problems with right leg swelling. She had a lower extremity arterial duplex examination done(02/13/17) which showed patent distal right superficial femoral artery stent and above-the-knee popliteal stent without evidence of restenosis. The ABI was more than 1.3 on the right and more than 1.3 on the left. This was consistent with noncompressible arteries due to medial calcification. The right great toe pressure and PPG waveforms are within normal limits and the left great toe pressure and PPG waveforms are decreased. he recommended she continue to wear her compression stockings and continue with elevation. She is scheduled to have a noninvasive arterial study in the near future 08/16/2017 -- had a lower extremity arterial duplex examination done which showed patent distal right superficial femoral artery stent and above-the-knee popliteal stent without evidence of restenosis. The ABI was more than 1.3 on the right and more than 1.3 on the left. This was consistent with noncompressible arteries due to medial calcification. The right great toe pressure and PPG waveforms are within normal limits and the left great toe pressure and PPG waveforms are decreased. the x-ray of the right ankle has not yet been done 08/24/2017 -- had a right ankle x-ray -- IMPRESSION:1. No fracture, bone lesion or evidence of osteomyelitis. 2. Lateral soft tissue swelling with a soft tissue ulcer. she has not yet seen the vascular surgeon for review 08/31/17 on evaluation today patient's wound appears to be showing signs of improvement. She still with her appointment with vascular in order to review her results of her vascular study and then determine if any intervention would be recommended at that  time. No fevers, chills, nausea, or vomiting noted at this time. She has been tolerating the dressing changes without complication. 09/28/17 on evaluation today patient's  wound appears to show signs of good improvement in regard to the granulation tissue which is surfacing. There is still a layer of slough covering the wound and the posterior portion is still significantly deeper than the anterior nonetheless there has been some good sign of things moving towards the better. She is going to go back to Dr. dew for reevaluation to ensure her blood flow is still appropriate. That will be before her next evaluation with [place] next week. No fevers, chills, nausea, or vomiting noted at this time. Patient does have some discomfort rated to be a 3-4/10 depending on activity specifically cleansing the wound makes it worse. 10/05/2017 -- the patient was seen by Dr. Lucky Cowboy last week and noninvasive studies showed a normal right ABI with brisk triphasic waveforms consistent with no arterial insufficiency including normal digital pressures. The duplex showed a patent distal right SFA stent and the proximal SFA was also normal. He was pleased with her test and thought she should have enough of perfusion for normal wound healing. He would see her back in 6 months time. 12/21/17 on evaluation today patient appears to be doing fairly well in regard to her right lateral ankle wound. Unfortunately the main issue that she is expansion at this point is that she is having some issues with what appears to be some cellulitis in the right anterior shin. She has also been noting a little bit of uncomfortable feeling especially last night and her ankle area. I'm afraid that she made the developing a little bit of an infection. With that being said I think it is in the early stages. 12/28/17 on evaluation today patient's ankle appears to be doing excellent. She's making good progress at this point the cellulitis seems to have improved  after last week's evaluation. Overall she is having no significant discomfort which is excellent news. She does have an appointment with Dr. dew on March 29, 2018 for reevaluation in regard to the stent he placed. She seems to have excellent blood flow in the right lower extremity. 01/19/12 on evaluation today patient's wound appears to be doing very well. In fact she does not appear to require debridement at this point, there's no evidence of infection, and overall from the standpoint of the wound she seems to be doing very well. With that being said I believe that it may be time to switch to different dressing away from the [facility] Dressing she tells me she does have a lot going on her friend actually passed away yesterday and she's also having a lot of issues with her husband this obviously is weighing heavy on her as far as your thoughts and concerns today. 01/25/18 on evaluation today patient appears to be doing fairly well in regard to her right lateral malleolus. She has been tolerating the dressing changes without complication. Overall I feel like this is definitely showing signs of improvement as far as how the overall appearance of the wound is there's also evidence of epithelium start to migrate over the granulation tissue. In general I think that she is progressing nicely as far as the wound is concerned. The only concern she really has is whether or not we can switch to every other week visits in order to avoid having as many appointments as her daughters have a difficult time getting her to her appointments as well as the patient's husband to his he is not doing very well at this point. 02/22/18 on evaluation today patient's right lateral malleolus ulcer  appears to be doing great. She has been tolerating the dressing changes without complication. Overall you making excellent progress at this time. Patient is having no significant discomfort. [name], [name] ([id number]) 03/15/18  on evaluation today patient appears to be doing much more poorly in regard to her right lateral ankle ulcer at this point. Unfortunately since have last seen her her husband has passed just a few days ago is obviously weighed heavily on her her daughter also had surgery well she is with her today as usual. There does not appear to be any evidence of infection she does seem to have significant contusion/deep tissue injury to the right lateral malleolus which was not noted previous when I saw her last. It's hard to tell of exactly when this injury occurred although during the time she was spending the night in the hospital this may have been most likely. 03/22/18 on evaluation today patient appears to actually be doing very well in regard to her ulcer. She did unfortunately have a setback which was noted last week however the good news is we seem to be getting back on track and in fact the wound in the core did still have some necrotic tissue which will be addressed at this point today but in general I'm seeing signs that things are on the up and up. She is glad to hear this obviously she's been somewhat concerned that due to the how her wound digressed more recently. 03/29/18 on evaluation today patient appears to be doing fairly well in regard to her right lower extremity lateral malleolus ulcer. She unfortunately does have a new area of pressure injury over the inferior portion where the wound has opened up a little bit larger secondary to the pressure she seems to be getting. She does tell me sometimes when she sleeps at night that it actually hurts and does seem to be pushing on the area little bit more unfortunately. There does not appear to be any evidence of infection which is good news. She has been tolerating the dressing changes without complication. She also did have some bruising in the left second and third toes due to the fact that she may have bump this or injured it although she has  neuropathy so she does not feel she did move recently that may have been where this came from. Nonetheless there does not appear to be any evidence of infection at this time. 04/12/18 on evaluation today patient's wound on the right lateral ankle actually appears to be doing a little bit better with a lot of necrotic docking tissue centrally loosening up in clearing away. However she does have the beginnings of a deep tissue injury on the left lateral malleolus likely due to the fact we've been trying offload the right as much as we have. I think she may benefit from an assistive soft device to help with offloading and it looks like they're looking at one of the doughnut conditions that wraps around the lower leg to offload which I think will definitely do a good job. With that being said I think we definitely need to address this issue on the left before it becomes a wound. Patient is not having significant pain. 04/19/18 on evaluation today patient appears to be doing excellent in regard to the progress she's made with her right lateral ankle ulcer. The left ankle region which did show evidence of a deep tissue injury seems to be resolving there's little fluid noted underneath and a blister there's  nothing open at this point in time overall I feel like this is progressing nicely which is good news. She does not seem to be having significant discomfort at this point which is also good news. 04/25/18-She is here in follow up evaluation for bilateral lateral malleolar ulcers. The right lateral malleolus ulcer with pale subcutaneous tissue exposure, central area of ulcer with tendon/periosteum exposed. The left lateral malleolus ulcer now with central area of nonviable tissue, otherwise deep tissue injury. She is wearing compression wraps to the left lower extremity, she will place the right lower extremity compression wraps on when she gets home. She will be out of town over the weekend and return next  week and follow-up appointment. She completed her doxycycline this morning 05/03/18 on evaluation today patient appears to be doing very well in regard to her right lateral ankle ulcer in general. At least she's showing some signs of improvement in this regard. Unfortunately she has some additional injury to the left lateral malleolus region which appears to be new likely even over the past several days. Again this determination is based on the overall appearance. With that being said the patient is obviously frustrated about this currently. 05/10/18-She is here in follow-up evaluation for bilateral lateral malleolar ulcers. She states she has purchased offloading shoes/boots and they will arrive tomorrow. She was asked to bring them in the office at next week's appointment so her provider is aware of product being utilized. She continues to sleep on right or left side, she has been encouraged to sleep on her back. The right lateral malleolus ulcer is precariously close to peri-osteum; will order xray. The left lateral malleolus ulcer is improved. Will switch back to santyl; she will follow up next week. 05/17/18 on evaluation today patient actually appears to be doing very well in regard to her malleolus her ulcers compared to last time I saw them. She does not seem to have as much in the way of contusion at this point which is great news. With that being said she does continue to have discomfort and I do believe that she is still continuing to benefit from the offloading/pressure reducing boots that were recommended. I think this is the key to trying to get this to heal up completely. 05/24/18 on evaluation today patient actually appears to be doing worse at this point in time unfortunately compared to her last week's evaluation. She is having really no increased pain which is good news unfortunately she does have more maceration in your theme and noted surrounding the right lateral ankle the left  lateral ankle is not really is erythematous I do not see signs of the overt cellulitis on that side. Unfortunately the wounds do not seem to have shown any signs of improvement since the last evaluation. She also has significant swelling especially on the right compared to previous some of this may be due to infection however also think that she may be served better while she has these wounds by compression wrapping versus continuing to use the Juxta-Lite for the time being. Especially with the amount of drainage that she is experiencing at this point. No fevers, chills, nausea, or vomiting noted at this time. 05/31/18 on evaluation today patient appears to actually be doing better in regard to her right lateral lower extremity ulcer specifically on the malleolus region. She has been tolerating the antibiotic without complication. With that being said she still continues to have issues but a little bit of redness although nothing like  she what she was experiencing previous. She still continues to pressure to her ankle area she did get the problem on offloading boots unfortunately she will not wear them she states there too uncomfortable and she can't get in and out of the bed. Nonetheless at this point her wounds seem to be continually getting worse which is not what we want I'm getting somewhat concerned about her progress and how things are going to proceed if we do not intervene in some way shape or form. I therefore had a very lengthy conversation today about offloading yet again and even made a specific suggestion for switching her to a memory foam mattress and even gave the information for a specific one that they could look at getting if it was something that they were interested in considering. She does not want to be considered for a hospital bed air mattress although honestly insurance would not cover it that she does not have any wounds on her trunk. 06/14/18 on evaluation today both wounds  over the bilateral lateral malleolus her ulcers appear to be doing better there's no evidence of pressure injury at this point. She did get the foam mattress for her bed and this does seem to have been extremely beneficial for her in my pinion. Her daughter states that she is having difficulty getting out of bed because of how soft it is. The patient also relates this to be. Nonetheless I do feel like she's actually doing better. Unfortunately right after and around the time she was getting the mattress she also sustained a fall when she got up to go pick up the phone and ended up injuring her right elbow she has [address] sut[address] place. We are not caring for this currently although home health is going to be taking the sutures out shortly. Nonetheless this may be something that we need to evaluate going forward. It depends on how well it has or has not healed in the end. She also recently saw an orthopedic specialist for an injection in the right shoulder just before her fall unfortunately the fall seems to have worsened her pain. 06/21/18 on evaluation today patient appears to be doing about the same in regard to her lateral malleolus ulcers. Both appear to be just a little bit deeper but again we are clinging away the necrotic and dead tissue which I think is why this is progressing towards a deeper realm as opposed [name], [name]. ([id number]) to improving from my measurement standpoint in that regard. Nonetheless she has been tolerating the dressing changes she absolutely hates the memory foam mattress topper that was obtained for her nonetheless I do believe this is still doing excellent as far as taking care of excess pressure in regard to the lateral malleolus regions. She in fact has no pressure injury that I see whereas in weeks past it was week by week I was constantly seeing new pressure injuries. Overall I think it has been very beneficial for her. 07/03/18; patient arrives in my clinic  today. She has deep punched out areas over her bilateral lateral malleoli. The area on the right has some more depth. We spent a lot of time today talking about pressure relief for these areas. This started when her daughter asked for a prescription for a memory foam mattress. I have never written a prescription for a mattress and I don't think insurances would pay for that on an ordinary bed. In any case he came up that she has foam  boots that she refuses to wear. I would suggest going to these before any other offloading issues when she is in bed. They say she is meticulous about offloading this the rest of the day 07/10/18- She is seen in follow-up evaluation for bilateral, lateral malleolus ulcers. There is no improvement in the ulcers. She has purchased and is sleeping on a memory foam mattress/overlay, she has been using the offloading boots nightly over the past week. She has a follow up appointment with vascular medicine at the end of October, in my opinion this follow up should be expedited given her deterioration and suboptimal TBI results. We will order plain film xray of the left ankle as deeper structures are palpable; would consider having MRI, regardless of xray report(s). The ulcers will be treated with iodoflex/iodosorb, she is unable to safely change the dressings daily with santyl. 07/19/18 on evaluation today patient appears to be doing in general visually well in regard to her bilateral lateral malleolus ulcers. She has been tolerating the dressing changes without complication which is good news. With that being said we did have an x-ray performed on 07/12/18 which revealed a slight loosen see in the lateral portion of the distal left fibula which may represent artifact but underline lytic destruction or osteomyelitis could not be excluded. MRI was recommended. With that being said we can see about getting the patient scheduled for an MRI to further evaluate this area. In fact we have  that scheduled currently for August 20 19,019. 07/26/18 on evaluation today patient's wound on the right lateral ankle actually appears to be doing fairly well at this point in my pinion. She has made some good progress currently. With that being said unfortunately in regard to the left lateral ankle ulcer this seems to be a little bit more problematic at this time. In fact as I further evaluated the situation she actually had bone exposed which is the first time that's been the case in the bone appear to be necrotic. Currently I did review patient's note from Dr. Bunnie Domino office with [facility]. He stated that ABI was 1.26 on the right and 0.95 on the left with good waveforms. Her perfusion is stable not reduced from previous studies and her digital waveforms were pretty good particularly on the right. His conclusion upon review of the note was that there was not much she could do to improve her perfusion and he felt she was adequate for wound healing. His suggestion was that she continued to see [place] and consider a synthetic skin graft if there was no underlying infection. He plans to see her back in six months or as needed. 08/01/18 on evaluation today patient appears to be doing better in regard to her right lateral ankle ulcer. Her left lateral ankle ulcer is about the same she still has bone involvement in evidence of necrosis. There does not appear to be evidence of infection at this time On the right lateral lower extremity. I have started her on the Augmentin she picked this up and started this yesterday. This is to get her through until she sees infectious disease which is scheduled for 08/12/18. 08/06/18 on evaluation today patient appears to be doing rather well considering my discussion with patient's daughter at the end of last week. The area which was marked where she had erythema seems to be improved and this is good news. With that being said overall the patient seems  to be making good improvement when it  comes to the overall appearance of the right lateral ankle ulcer although this has been slow she at least is coming around in this regard. Unfortunately in regard to the left lateral ankle ulcer this is osteomyelitis based on the pathology report as well is bone culture. Nonetheless we are still waiting CT scan. Unfortunately the MRI we originally ordered cannot be performed as the patient is a pacemaker which I had overlooked. Nonetheless we are working on the CT scan approval and scheduling as of now. She did go to the hospital over the weekend and was placed on IV Cefzo for a couple of days. Fortunately this seems to have improved the erythema quite significantly which is good news. There does not appear to be any evidence of worsening infection at this time. She did have some bleeding after the last debridement therefore I did not perform any sharp debridement in regard to left lateral ankle at this point. Patient has been approved for a snap vac for the right lateral ankle. 08/14/18; the patient with wounds over her bilateral lateral malleoli. The area on the right actually looks quite good. Been using a snap back on this area. Healthy granulation and appears to be filling in. Unfortunately the area on the left is really problematic. 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 Also noted to have cellulitis. She saw Dr. Novella Olive of infectious disease today and was put on linezolid. We are able to verify this with her pharmacy. She is completed the Augmentin that she was already on. We've been using Iodoflex to this area 08/23/18 on evaluation today patient's wounds both actually appear to be doing better compared to my prior evaluations. Fortunately she showing signs of good improvement in regard to the overall wound status especially where were using the snap vac on the right. In  regard to left lateral malleolus the wound bed actually appears to be much cleaner than previously noted. I do not feel any phone directly probed during evaluation today and though there is tendon noted this does not appear to be necrotic it's actually fairly good as far as the overall appearance of the tendon is concerned. In general the wound bed actually appears to be doing significantly better than it was previous. Patient is currently in the care of [name] and I did review that note today. He actually has her on two weeks of linezolid and then following the patient will be on 1-2 months of Keflex. That is the plan currently. She has been on antibiotics therapy as prescribed by myself initially starting on July 30, 2018 and has been on that continuously up to this point. 08/30/18 on evaluation today patient actually appears to be doing much better in regard to her right lateral malleolus ulcer. She has been tolerating the dressing changes specifically the snap vac without complication although she did have some issues with the seal currently. Apparently there was some trouble with getting it to maintain over the past week past Sunday. Nonetheless overall the wound appears better in regard to the right lateral malleolus region. In regard to left lateral malleolus this actually show some signs of additional granulation although there still tendon noted in the base of the wound this appears to be healthy not necrotic in any way whatsoever. We are considering potentially using a snap vac for the left lateral malleolus as well the product wrap from [employer], [place], was present in  the clinic today we're going to see this patient I did have her come in with me after obtaining consent from the patient and her daughter in order to look at the wound and see if there's any recommendation one way or another as to whether or not they felt the snapback could be beneficial for the left lateral malleolus region.  But the conclusion was that it might be but that this is definitely a little bit deeper wound than what traditionally would be utilized for a snap vac. 09/06/18 on evaluation today patient actually appears to be doing excellent in my pinion in regard to both ankle ulcers. She has been tolerating the dressing changes without complication which is great news. Specifically we have been using the snap vac. In regard to the right ankle I'm not even sure that this is going to be necessary for today and following as the wound has filled in quite nicely. In regard to the left ankle I do believe SEASON, ASTACIO ([id number]) that we're seeing excellent epithelialization from the edge as well as granulation in the central portion the tendon is still exposed but there's no evidence of necrotic bone and in general I feel like the patient has made excellent progress even compared to last week with just one week of the snap vac. 09/11/18; this is a patient who has wounds on her bilateral lateral malleoli. Initially both of these were deep stage IV wounds in the setting of chronic arterial insufficiency. She has been revascularized. As I understand think she been using snap vacs to both of these wounds however the area on the right became more superficial and currently she is only using it on the left. Using silver collagen on the right and silver collagen under the back on the left I believe 09/19/18 on evaluation today patient actually appears to be doing very well in regard to her lateral malleolus or ulcers bilaterally. She has been tolerating the dressing changes without complication. Fortunately there does not appear to be any evidence of infection at this time. Overall I feel like she is improving in an excellent manner and I'm very pleased with the fact that everything seems to be turning towards the better for her. This has obviously been a long road. 09/27/18 on evaluation today patient actually appears  to be doing very well in regard to her bilateral lateral malleolus ulcers. She has been tolerating the dressing changes without complication. Fortunately there does not appear to be any evidence of infection at this time which is also great news. No fevers, chills, nausea, or vomiting noted at this time. Overall I feel like she is doing excellent with the snap vac on the left malleolus. She had 40 mL of fluid collection over the past week. 10/04/18 on evaluation today patient actually appears to be doing well in regard to her bilateral lateral malleolus ulcers. She continues to tolerate the dressing changes without complication. One issue that I see is the snap vac on the left lateral malleolus which appears to have sealed off some fluid underlying this area and has not really allowed it to heal to the degree that I would like to see. For that reason I did suggest at this point we may want to pack a small piece of packing strip into this region to allow it to more effectively wick out fluid. 10/11/18 in general the patient today does not feel that she has been doing very well. She's been a little bit lethargic and  subsequently is having bodyaches as well according to what she tells me today. With that being said overall she has been concerned with the fact that something may be worsening although to be honest her wounds really have not been appearing poorly. She does have a new ulcer on her left heel unfortunately. This may be pressure related. Nonetheless it seems to me to have potentially started at least as a blister I do not see any evidence of deep tissue injury. In regard to the left ankle the snap vac still seems to be causing the ceiling off of the deeper part of the wound which is in turn trapping fluid. I'm not extremely pleased with the overall appearance as far as progress from last week to this week therefore I'm gonna discontinue the snap vac at this point. 10/18/18 patient unfortunately  this point has not been feeling well for the past several days. She was seen by Grayland Ormond her primary care provider who is a Librarian, academic at [facility]. Subsequently she states that she's been very weak and generally feeling malaise. No fevers, chills, nausea, or vomiting noted at this time. With that being said bloodwork was performed at the PCP office on the 11th of this month which showed a white blood cell count of 10.7. This was repeated today and shows a white blood cell count of 12.4. This does show signs of worsening. Coupled with the fact that she is feeling worse and that her left ankle wound is not really showing signs of improvement I feel like this is an indication that the osteomyelitis is likely exacerbating not improving. Overall I think we may also want to check her C-reactive protein and sedimentation rate. Actually did call [facility] office this afternoon while the patient was in the office here with me. Subsequently based on the findings we discussed treatment possibilities and I think that it is appropriate for [place] to go ahead and initiate treatment with doxycycline which I'm going to do. Subsequently he did agree to see about adding a CRP and sedimentation rate to her orders. If that has not already been drawn to where they can run it they will contact the patient she can come back to have that check. They are in agreement with plan as far as the patient and her daughter are concerned. Nonetheless also think we need to get in touch with Dr. Henreitta Leber office to see about getting the patient scheduled with him as soon as possible. 11/08/18 on evaluation today patient presents for follow-up concerning her bilateral foot and ankle ulcers. I did do an extensive review of her chart in epic today. Subsequently she was seen by [name] he did initiate Cefepime IV antibiotic therapy. Subsequently she had some issues with her PICC line this had to be removed because it  was coiled and then replaced. Fortunately that was now settled. Unfortunately she has continued have issues with her left heel as well as the issues that she is experiencing with her bilateral lateral malleolus regions. I do believe however both areas seem to be doing a little bit better on evaluation today which is good news. No fevers, chills, nausea, or vomiting noted at this time. She actually has an angiogram schedule with Dr. dew on this coming Monday, November 11, 2018. Subsequently the patient states that she is feeling much better especially than what she was roughly 2 weeks ago. She actually had to cancel an appointment because she was feeling so poorly. No fevers,  chills, nausea, or vomiting noted at this time. 11/15/18 on evaluation today patient actually is status post having had her angiogram with Dr. dew Monday, four days ago. 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 He had to go and work on several areas of the vasculature fortunately he was able to obtain no more than a 30% residual stenosis throughout post procedure. I reviewed this note today. I think this will definitely help with healing at this time. Fortunately there does not appear to be any signs of infection and I do feel like ratio already has a better appearance to it. 11/22/18 upon evaluation today patient actually appears to be doing very well in regard to her wounds in general. The right lateral malleolus looks excellent the heel looks better in the left lateral malleolus also appears to be doing a little better. With that being said the right second toe actually appears to be open and training we been watching this is been dry and stable but now is open. 12/03/2018 Seen today for follow-up and management of multiple bilateral lower extremity wounds. New pressure injury of the great toe which is closed at this time. Wound of the right distal second toe appears larger today with deep undermining  and a pocket of fluid present within the undermining region. Left and right malleolus is wounds are stable today with no signs and symptoms of infection.Denies any needs or concerns during exam today. 12/13/18 on evaluation today patient appears to be doing somewhat better in regard to her left heel ulcer. She also seems to be completely healed in regard to the right lateral malleolus ulcer. The left malleolus ulcer is smaller what unfortunately the wounds which are new over the first and second toes of the right foot are what are most concerning at this point especially the second. Both areas did require sharp debridement today. 12/20/18 on evaluation today patient's wound actually appears to be doing better in regard to left lateral ankle and her right lateral ankle continues to remain healed. The hill ulcer on the left is improved. She does have improvement noted as well in regard to both toe ulcers. Overall I'm very pleased in this regard. No fevers, chills, nausea, or vomiting noted at this time. 12/23/18 on evaluation today patient is seen after she had her toenails trimmed at the podiatrist office due to issues with her right great toe. There was what appeared to be dark eschar on the surface of the wound which had her in the podiatrist concerned. Nonetheless as I remember that during the last office visit I had utilize silver nitrate of this area I was much less concerned about the situation. Subsequently I was able to clean off much of this tissue without any complication today. This does not appear to show any signs of infection and actually look somewhat better [name], [name] ([id number]) compared to last time post debridement. Her second toe on the right foot actually had callous over and there did appear still be some fluid underneath this that would require debridement today. 12/27/18 on evaluation today patient actually appears to be showing signs of improvement at all locations. Even  the left lateral ankle although this is not quite as great as the other sites. Fortunately there does not appear to be any signs of infection at this time and both of her toes on the right foot seem to be showing signs of improvement which is good news and very pleased  in this regard. 01/03/19 on evaluation today patient appears to be doing better for the most part in regard to her wounds in particular. There does not appear to be any evidence of infection at this time which is good news. Fortunately there is no sign of really worsening anywhere except for the right great toe which she does have what appears to be a bruise/deep tissue injury which is very superficial and already resolving. I'm not sure where this came from I questioned her extensively and she does not recall what may have happened with this. Other than that the patient seems to be doing well even the left lateral ankle ulcer looks good and is getting smaller. 01/10/19 on evaluation today patient appears to be doing well in regard to her left heel wound and both of her toe wounds. Overall I feel like there is definitely improvement here and I'm happy in that regard. With that being said unfortunately she is having issues with the left lateral malleolus ulcer which unfortunately still has a lot of depth to it. This is gonna be a very difficult wound for [place] to be able to truly get to heal. I may want to consider some type of skin substitute to see if this would be of benefit for her. I'll discuss this with her more the next visit most likely. This was something I thought about more at the end of the visit when I was Artie out of the room and the patient had been discharged. 01/17/19 on evaluation today patient appears to be doing very well in regard to her wounds in general. She's been making excellent progress at this time. Fortunately there's no sign of infection at this time either. No fevers, chills, nausea, or vomiting noted at this  time. The biggest issue is still her left lateral malleolus where it appears to be doing well and is getting smaller but still shows a small corner where this is deeper and goes down into what appears to be the joint space. Nonetheless this is taking much longer to heal although it still looks better in smaller than previous evaluations. 01/24/19 on evaluation today patient's wounds actually appear to be doing rather well in general overall. She did require some sharp debridement in regard to the right great toe but everything else appears to be doing excellent no debridement was even necessary. No fevers, chills, nausea, or vomiting noted at this time. 01/31/19 on evaluation today patient actually appears to be doing much better in regard to her left foot wound on the heel as well as the ankle. The right great toe appears to be a little bit worse today this had callous over and trapped a lot of fluid underneath. Fortunately there's no signs of infection at any site which is great news. 02/07/19 on evaluation today patient actually appears to be doing decently well in regard to all of her ulcers at this point. No sharp debridement was required she is a little bit of hyper granulation in regard to the left lateral ankle as well as the left heel but the hill itself is almost completely healed which is excellent news. Overall been very pleased in this regard. 02/14/19 on evaluation today patient actually appears to be doing very well in regard to her ulcers on the right first toe, left lateral malleolus, and left heel. In fact the heel is almost completely healed at this point. The patient does not show any signs of infection which is good news. Overall very  pleased with how things have progressed. 04/18/19 Telehealth Evaluation During the COVID-19 National Emergency: Verbal Consent: Obtained from patient Allergies: reviewed and the active list is current. Medication changes: patient has no current  medication changes. COVID-19 Screening: 1. Have you traveled internationally or on a cruise ship in the last 14 dayso No 2. Have you had contact with someone with or under investigation for COVID-19o No 3. Have you had a fever, cough, sore throat, or experiencing shortness of breatho No on evaluation today actually did have a visit with this patient through a telehealth encounter with her home health nurse. Subsequently it was noted that the patient actually appears to be doing okay in regard to her wounds both the right great toe as well as the left lateral malleolus have shown signs of improvement although this in your theme around the left lateral malleolus there eschar coverings for both locations. The question is whether or not they are actually close and whether or not home health needs to discharge the patient or not. Nonetheless my concern is this point obviously is that without actually seeing her and being able to evaluate this directly I cannot ensure that she is completely healed which is the question that I'm being asked. 04/22/19 on evaluation today patient presents for her first evaluation since last time I saw her which was actually February 14, 2019. I did do a telehealth visit last week in which point it was questionable whether or not she may be healed and had to bring her in today for confirmation. With that being said she does seem to be doing quite well at this point which is good news. There does not appear to be any drainage in the deed I believe her wounds may be healed. Readmission: 09/04/2019 on evaluation today patient appears to be doing unfortunately somewhat more poorly in regard to her left foot ulcer secondary to a wound that began on 08/21/2019 at least when she first noticed this. Fortunately she has not had any evidence of active infection at this time. Systemically. I also do not necessarily see any evidence of infection at the blister/wound site on the first  metatarsal head plantar aspect. This almost appears to be something that may have just rubbed inappropriately causing this to breakdown. They did not want a wait too long to come in to be seen as again she had significant issues in the past with wounds that took quite a while to heal in fact it was close to 2 years. Nonetheless this does not appear to be quite that bad but again we do need to remove some of the necrotic tissue from the surface of the wound to tell exactly the extent. She does not appear to have any significant arterial disease at this point and again her last ABIs and TBI's are recorded above in the alert section her left ABI was 1.27 with a TBI of 0.72 to the right ABI 1.08 with a TBI of 0.39. Other than this the patient has been doing quite well since I last saw her and that was in May 2020. 09/11/2019 on evaluation today patient appeared to be doing very well with regard to her plantar foot ulcer on the left. In fact this appears to be almost completely healed which is awesome. That is after just 1 week of intervention. With that being said there is no signs of active infection at this time. 09/18/2019 on evaluation today patient actually appears to be doing excellent in fact she  is completely healed based on what I am seeing at this [name], [name] ([id number]) point. Fortunately there is no signs of active infection at this time and overall patient is very pleased to hear that this area has healed so quickly. Readmission: 05/13/2021 upon evaluation today this patient presents for reevaluation here in the clinic. This is a wound that actually we previously took care of. She had 1 on the right ankle and the left the left turned out to be be harder due to to heal but nonetheless is doing great at this point as the right that has reopened and it was noted first just several weeks ago with a scab over it and came off in just the past few days. Fortunately there does not appear to  be any obvious evidence of significant active infection at this time which is great news. No fevers, chills, nausea, vomiting, or diarrhea. 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 There does not appear to be any signs of this interfering in any way with her wound. She does have swelling we previously had compression socks for her ordered but again it does not look like she wears these on a regular basis by any means. 05/26/2021 upon evaluation today patient appears to be doing well with regard to her wound which is actually showing signs of excellent improvement. There does not appear to be any signs of active infection which is great news and overall very pleased with where things stand today. No fevers, chills, nausea, vomiting, or diarrhea. Electronic Signature(s) Signed: 05/26/2021 4:05:59 PM By: Worthy Keeler PA-C Entered By: Worthy Keeler on 05/26/2021 16:05:59 [name], [name] ([id number]) -------------------------------------------------------------------------------- Physical Exam Details Patient Name: [name] Date of Service: 05/26/2021 3:45 PM Medical Record Number: [id number] Patient Account Number: [id number] Date of Birth/Sex: 30-May-1923 (87 y.o. F) Treating RN: Dolan Amen Primary Care Provider: Adrian Prows Other Clinician: Referring Provider: Adrian Prows Treating Provider/Extender: Skipper Cliche in Treatment: 1 Constitutional Well-nourished and well-hydrated in no acute distress. Respiratory normal breathing without difficulty. Psychiatric this patient is able to make decisions and demonstrates good insight into disease process. Alert and Oriented x 3. pleasant and cooperative. Notes Patient's wound bed showed signs again of good granulation epithelization at this point. There does not appear to be any signs of active infection which is great news and overall I think the patient is making  excellent progress. No fevers, chills, nausea, vomiting, or diarrhea. Electronic Signature(s) Signed: 05/26/2021 4:06:17 PM By: Worthy Keeler PA-C Entered By: Worthy Keeler on 05/26/2021 16:06:17 [name], [name] ([id number]) -------------------------------------------------------------------------------- Physician Orders Details Patient Name: [name] Date of Service: 05/26/2021 3:45 PM Medical Record Number: [id number] Patient Account Number: [id number] Date of Birth/Sex: September 07, 1923 (87 y.o. F) Treating RN: Dolan Amen Primary Care Provider: Adrian Prows Other Clinician: Referring Provider: Adrian Prows Treating Provider/Extender: Skipper Cliche in Treatment: 1 Verbal / Phone Orders: No Diagnosis Coding ICD-10 Coding Code Description 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, stage 3 I89.0 Lymphedema, not elsewhere classified I87.2 Venous insufficiency (chronic) (peripheral) Z95.0 Presence of cardiac pacemaker Z79.01 Long term (current) use of anticoagulants Follow-up Appointments o Return Appointment in 1 week. o Nurse Visit as needed Bathing/ Shower/ Hygiene o May shower with wound dressing protected with water repellent cover or cast protector. Edema Control - Lymphedema / Segmental Compressive Device / Other o Optional: One layer of unna paste to top of compression wrap (to  act as an anchor). o Elevate, Exercise Daily and Avoid Standing for Long Periods of Time. o Elevate legs to the level of the heart and pump ankles as often as possible o Elevate leg(s) parallel to the floor when sitting. Wound Treatment Wound #7 - Malleolus Wound Laterality: Right, Lateral Cleanser: Soap and Water 1 x Per Week/30 Days Discharge Instructions: Gently cleanse wound with antibacterial soap, rinse and pat dry prior to dressing wounds Primary Dressing: Hydrofera Blue Ready Transfer Foam, 2.5x2.5 (in/in) 1 x Per Week/30 Days Discharge Instructions:  Apply Hydrofera Blue Ready to wound bed as directed Secondary Dressing: ABD Pad 5x9 (in/in) 1 x Per Week/30 Days Discharge Instructions: Cover with ABD pad Compression Wrap: Profore Lite LF 3 Multilayer Compression Bandaging System 1 x Per Week/30 Days Discharge Instructions: Apply 3 multi-layer wrap as prescribed. Electronic Signature(s) Signed: 05/26/2021 4:09:04 PM By: Worthy Keeler PA-C Signed: 05/26/2021 4:26:32 PM By: Georges Mouse, Minus Breeding RN Entered By: Georges Mouse, Minus Breeding on 05/26/2021 16:02:49 [name], [name] ([id number]) -------------------------------------------------------------------------------- Problem List Details Patient Name: JAKYRA, KENEALY Date of Service: 05/26/2021 3:45 PM Medical Record Number: [id number] Patient Account Number: [id number] Date of Birth/Sex: 1923/09/18 (87 y.o. F) Treating RN: Dolan Amen Primary Care Provider: Adrian Prows Other Clinician: Referring Provider: Adrian Prows Treating Provider/Extender: Skipper Cliche in Treatment: 1 Active Problems ICD-10 Encounter Code Description Active Date MDM Diagnosis 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, stage 3 05/13/2021 No Yes I89.0 Lymphedema, not elsewhere classified 05/13/2021 No Yes I87.2 Venous insufficiency (chronic) (peripheral) 05/13/2021 No Yes Z95.0 Presence of cardiac pacemaker 05/13/2021 No Yes Z79.01 Long term (current) use of anticoagulants 05/13/2021 No Yes Inactive Problems Resolved Problems Electronic Signature(s) Signed: 05/26/2021 3:32:56 PM By: Worthy Keeler PA-C Entered By: Worthy Keeler on 05/26/2021 15:32:55 [name], [name] ([id number]) -------------------------------------------------------------------------------- Progress Note Details Patient Name: [name] Date of Service: 05/26/2021 3:45 PM Medical Record Number: [id number] Patient Account Number: [id number] Date of Birth/Sex: June 04, 1923 (87 y.o. 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 Dr. Amalia Hailey the podiatrist saw her on 07/20/2017 and sent her to the wound center for possible hyperbaric oxygen therapy. past medical history of peripheral vascular disease, varicose veins, status post appendectomy, basal cell carcinoma excision from the left leg, cholecystectomy, pacemaker placement, right lower extremity angiography done by Dr. dew in March 2017 with placement of a stent. there is also note of a successful ablation of the right small saphenous vein done which was reviewed by ultrasound on 10/24/2016. the patient had a right small saphenous vein ablation done on 10/20/2016. The patient has never been a smoker. She has been seen by Dr. Corene Cornea dew the vascular surgeon who most recently saw her on 06/15/2017 for evaluation of ongoing problems with right leg swelling. She had a lower extremity arterial duplex examination done(02/13/17) which showed patent distal right superficial femoral artery stent and above-the-knee popliteal stent without evidence of restenosis. The ABI was more than 1.3 on the right and more than 1.3 on the left. This was consistent with noncompressible arteries due to medial calcification. The right great toe pressure and PPG waveforms are within normal limits and the  left great toe pressure and PPG waveforms are decreased. he recommended she continue to wear her compression stockings and continue with elevation. She is scheduled to have a noninvasive arterial study in the near future 08/16/2017  -- had a lower extremity arterial duplex examination done which showed patent distal right superficial femoral artery stent and above-the-knee popliteal stent without evidence of restenosis. The ABI was more than 1.3 on the right and more than 1.3 on the left. This was consistent with noncompressible arteries due to medial calcification. The right great toe pressure and PPG waveforms are within normal limits and the left great toe pressure and PPG waveforms are decreased. the x-ray of the right ankle has not yet been done 08/24/2017 -- had a right ankle x-ray -- IMPRESSION:1. No fracture, bone lesion or evidence of osteomyelitis. 2. Lateral soft tissue swelling with a soft tissue ulcer. she has not yet seen the vascular surgeon for review 08/31/17 on evaluation today patient's wound appears to be showing signs of improvement. She still with her appointment with vascular in order to review her results of her vascular study and then determine if any intervention would be recommended at that time. No fevers, chills, nausea, or vomiting noted at this time. She has been tolerating the dressing changes without complication. 09/28/17 on evaluation today patient's wound appears to show signs of good improvement in regard to the granulation tissue which is surfacing. There is still a layer of slough covering the wound and the posterior portion is still significantly deeper than the anterior nonetheless there has been some good sign of things moving towards the better. She is going to go back to Dr. dew for reevaluation to ensure her blood flow is still appropriate. That will be before her next evaluation with [place] next week. No fevers, chills, nausea, or vomiting noted at this time. Patient does have some discomfort rated to be a 3-4/10 depending on activity specifically cleansing the wound makes it worse. 10/05/2017 -- the patient was seen by Dr. Lucky Cowboy last week and noninvasive studies showed a normal right ABI  with brisk triphasic waveforms consistent with no arterial insufficiency including normal digital pressures. The duplex showed a patent distal right SFA stent and the proximal SFA was also normal. He was pleased with her test and thought she should have enough of perfusion for normal wound healing. He would see her back in 6 months time. 12/21/17 on evaluation today patient appears to be doing fairly well in regard to her right lateral ankle wound. Unfortunately the main issue that she is expansion at this point is that she is having some issues with what appears to be some cellulitis in the right anterior shin. She has also been noting a little bit of uncomfortable feeling especially last night and her ankle area. I'm afraid that she made the developing a little bit of an infection. With that being said I think it is in the early stages. 12/28/17 on evaluation today patient's ankle appears to be doing excellent. She's making good progress at this point the cellulitis seems to have improved after last week's evaluation. Overall she is having no significant discomfort which is excellent news. She does have an appointment with Dr. dew on March 29, 2018 for reevaluation in regard to the stent he placed. She seems to have excellent blood flow in the right lower extremity. 01/19/12 on evaluation today patient's wound appears to be doing very well. In fact she does not appear to require debridement at this  point, there's no evidence of infection, and overall from the standpoint of the wound she seems to be doing very well. With that being said I believe that it may be time to switch to different dressing away from the [facility] Dressing she tells me she does have a lot going on her friend actually passed away yesterday and she's also having a lot of issues with her husband this obviously is weighing heavy on her as far as your thoughts and concerns today. 01/25/18 on evaluation today patient appears to be  doing fairly well in regard to her right lateral malleolus. She has been tolerating the dressing changes without complication. Overall I feel like this is definitely showing signs of improvement as far as how the overall appearance of the wound is there's also evidence of epithelium start to migrate over the granulation tissue. In general I think that she is progressing nicely as far as the wound is concerned. The only concern she really has is whether or not we can switch to every other week visits in order to avoid having as many INGRI, DIEMER ([id number]) appointments as her daughters have a difficult time getting her to her appointments as well as the patient's husband to his he is not doing very well at this point. 02/22/18 on evaluation today patient's right lateral malleolus ulcer appears to be doing great. She has been tolerating the dressing changes without complication. Overall you making excellent progress at this time. Patient is having no significant discomfort. 03/15/18 on evaluation today patient appears to be doing much more poorly in regard to her right lateral ankle ulcer at this point. Unfortunately since have last seen her her husband has passed just a few days ago is obviously weighed heavily on her her daughter also had surgery well she is with her today as usual. There does not appear to be any evidence of infection she does seem to have significant contusion/deep tissue injury to the right lateral malleolus which was not noted previous when I saw her last. It's hard to tell of exactly when this injury occurred although during the time she was spending the night in the hospital this may have been most likely. 03/22/18 on evaluation today patient appears to actually be doing very well in regard to her ulcer. She did unfortunately have a setback which was noted last week however the good news is we seem to be getting back on track and in fact the wound in the core did still have  some necrotic tissue which will be addressed at this point today but in general I'm seeing signs that things are on the up and up. She is glad to hear this obviously she's been somewhat concerned that due to the how her wound digressed more recently. 03/29/18 on evaluation today patient appears to be doing fairly well in regard to her right lower extremity lateral malleolus ulcer. She unfortunately does have a new area of pressure injury over the inferior portion where the wound has opened up a little bit larger secondary to the pressure she seems to be getting. She does tell me sometimes when she sleeps at night that it actually hurts and does seem to be pushing on the area little bit more unfortunately. There does not appear to be any evidence of infection which is good news. She has been tolerating the dressing changes without complication. She also did have some bruising in the left second and third toes due to the fact that she  may have bump this or injured it although she has neuropathy so she does not feel she did move recently that may have been where this came from. Nonetheless there does not appear to be any evidence of infection at this time. 04/12/18 on evaluation today patient's wound on the right lateral ankle actually appears to be doing a little bit better with a lot of necrotic docking tissue centrally loosening up in clearing away. However she does have the beginnings of a deep tissue injury on the left lateral malleolus likely due to the fact we've been trying offload the right as much as we have. I think she may benefit from an assistive soft device to help with offloading and it looks like they're looking at one of the doughnut conditions that wraps around the lower leg to offload which I think will definitely do a good job. With that being said I think we definitely need to address this issue on the left before it becomes a wound. Patient is not having significant pain. 04/19/18 on  evaluation today patient appears to be doing excellent in regard to the progress she's made with her right lateral ankle ulcer. The left ankle region which did show evidence of a deep tissue injury seems to be resolving there's little fluid noted underneath and a blister there's nothing open at this point in time overall I feel like this is progressing nicely which is good news. She does not seem to be having significant discomfort at this point which is also good news. 04/25/18-She is here in follow up evaluation for bilateral lateral malleolar ulcers. The right lateral malleolus ulcer with pale subcutaneous tissue exposure, central area of ulcer with tendon/periosteum exposed. The left lateral malleolus ulcer now with central area of nonviable tissue, otherwise deep tissue injury. She is wearing compression wraps to the left lower extremity, she will place the right lower extremity compression wraps on when she gets home. She will be out of town over the weekend and return next week and follow-up appointment. She completed her doxycycline this morning 05/03/18 on evaluation today patient appears to be doing very well in regard to her right lateral ankle ulcer in general. At least she's showing some signs of improvement in this regard. Unfortunately she has some additional injury to the left lateral malleolus region which appears to be new likely even over the past several days. Again this determination is based on the overall appearance. With that being said the patient is obviously frustrated about this currently. 05/10/18-She is here in follow-up evaluation for bilateral lateral malleolar ulcers. She states she has purchased offloading shoes/boots and they will arrive tomorrow. She was asked to bring them in the office at next week's appointment so her provider is aware of product being utilized. She continues to sleep on right or left side, she has been encouraged to sleep on her back. The right  lateral malleolus ulcer is precariously close to peri-osteum; will order xray. The left lateral malleolus ulcer is improved. Will switch back to santyl; she will follow up next week. 05/17/18 on evaluation today patient actually appears to be doing very well in regard to her malleolus her ulcers compared to last time I saw them. She does not seem to have as much in the way of contusion at this point which is great news. With that being said she does continue to have discomfort and I do believe that she is still continuing to benefit from the offloading/pressure reducing boots that were  recommended. I think this is the key to trying to get this to heal up completely. 05/24/18 on evaluation today patient actually appears to be doing worse at this point in time unfortunately compared to her last week's evaluation. She is having really no increased pain which is good news unfortunately she does have more maceration in your theme and noted surrounding the right lateral ankle the left lateral ankle is not really is erythematous I do not see signs of the overt cellulitis on that side. Unfortunately the wounds do not seem to have shown any signs of improvement since the last evaluation. She also has significant swelling especially on the right compared to previous some of this may be due to infection however also think that she may be served better while she has these wounds by compression wrapping versus continuing to use the Juxta-Lite for the time being. Especially with the amount of drainage that she is experiencing at this point. No fevers, chills, nausea, or vomiting noted at this time. 05/31/18 on evaluation today patient appears to actually be doing better in regard to her right lateral lower extremity ulcer specifically on the malleolus region. She has been tolerating the antibiotic without complication. With that being said she still continues to have issues but a little bit of redness although nothing  like she what she was experiencing previous. She still continues to pressure to her ankle area she did get the problem on offloading boots unfortunately she will not wear them she states there too uncomfortable and she can't get in and out of the bed. Nonetheless at this point her wounds seem to be continually getting worse which is not what we want I'm getting somewhat concerned about her progress and how things are going to proceed if we do not intervene in some way shape or form. I therefore had a very lengthy conversation today about offloading yet again and even made a specific suggestion for switching her to a memory foam mattress and even gave the information for a specific one that they could look at getting if it was something that they were interested in considering. She does not want to be considered for a hospital bed air mattress although honestly insurance would not cover it that she does not have any wounds on her trunk. 06/14/18 on evaluation today both wounds over the bilateral lateral malleolus her ulcers appear to be doing better there's no evidence of pressure injury at this point. She did get the foam mattress for her bed and this does seem to have been extremely beneficial for her in my pinion. Her daughter states that she is having difficulty getting out of bed because of how soft it is. The patient also relates this to be. Nonetheless I do feel like she's actually doing better. Unfortunately right after and around the time she was getting the mattress she also sustained a fall when she got up to go pick up the phone and ended up injuring her right elbow she has 18 sutures in place. We are not caring for this currently although home health is going to be taking the sutures out shortly. Nonetheless this may be something that we need to evaluate going forward. It depends on [name], [name] ([id number]) how well it has or has not healed in the end. She also recently saw an  orthopedic specialist for an injection in the right shoulder just before her fall unfortunately the fall seems to have worsened her pain. 06/21/18 on evaluation today  patient appears to be doing about the same in regard to her lateral malleolus ulcers. Both appear to be just a little bit deeper but again we are clinging away the necrotic and dead tissue which I think is why this is progressing towards a deeper realm as opposed to improving from my measurement standpoint in that regard. Nonetheless she has been tolerating the dressing changes she absolutely hates the memory foam mattress topper that was obtained for her nonetheless I do believe this is still doing excellent as far as taking care of excess pressure in regard to the lateral malleolus regions. She in fact has no pressure injury that I see whereas in weeks past it was week by week I was constantly seeing new pressure injuries. Overall I think it has been very beneficial for her. 07/03/18; patient arrives in my clinic today. She has deep punched out areas over her bilateral lateral malleoli. The area on the right has some more depth. We spent a lot of time today talking about pressure relief for these areas. This started when her daughter asked for a prescription for a memory foam mattress. I have never written a prescription for a mattress and I don't think insurances would pay for that on an ordinary bed. In any case he came up that she has foam boots that she refuses to wear. I would suggest going to these before any other offloading issues when she is in bed. They say she is meticulous about offloading this the rest of the day 07/10/18- She is seen in follow-up evaluation for bilateral, lateral malleolus ulcers. There is no improvement in the ulcers. She has purchased and is sleeping on a memory foam mattress/overlay, she has been using the offloading boots nightly over the past week. She has a follow up appointment with vascular medicine  at the end of October, in my opinion this follow up should be expedited given her deterioration and suboptimal TBI results. We will order plain film xray of the left ankle as deeper structures are palpable; would consider having MRI, regardless of xray report(s). The ulcers will be treated with iodoflex/iodosorb, she is unable to safely change the dressings daily with santyl. 07/19/18 on evaluation today patient appears to be doing in general visually well in regard to her bilateral lateral malleolus ulcers. She has been tolerating the dressing changes without complication which is good news. With that being said we did have an x-ray performed on 07/12/18 which revealed a slight loosen see in the lateral portion of the distal left fibula which may represent artifact but underline lytic destruction or osteomyelitis could not be excluded. MRI was recommended. With that being said we can see about getting the patient scheduled for an MRI to further evaluate this area. In fact we have that scheduled currently for August 20 19,019. 07/26/18 on evaluation today patient's wound on the right lateral ankle actually appears to be doing fairly well at this point in my pinion. She has made some good progress currently. With that being said unfortunately in regard to the left lateral ankle ulcer this seems to be a little bit more problematic at this time. In fact as I further evaluated the situation she actually had bone exposed which is the first time that's been the case in the bone appear to be necrotic. Currently I did review patient's note from Dr. Bunnie Domino office with [facility]. He stated that ABI was 1.26 on the right and 0.95 on the left with  good waveforms. Her perfusion is stable not reduced from previous studies and her digital waveforms were pretty good particularly on the right. His conclusion upon review of the note was that there was not much she could do to improve her perfusion  and he felt she was adequate for wound healing. His suggestion was that she continued to see [place] and consider a synthetic skin graft if there was no underlying infection. He plans to see her back in six months or as needed. 08/01/18 on evaluation today patient appears to be doing better in regard to her right lateral ankle ulcer. Her left lateral ankle ulcer is about the same she still has bone involvement in evidence of necrosis. There does not appear to be evidence of infection at this time On the right lateral lower extremity. I have started her on the Augmentin she picked this up and started this yesterday. This is to get her through until she sees infectious disease which is scheduled for 08/12/18. 08/06/18 on evaluation today patient appears to be doing rather well considering my discussion with patient's daughter at the end of last week. The area which was marked where she had erythema seems to be improved and this is good news. With that being said overall the patient seems to be making good improvement when it comes to the overall appearance of the right lateral ankle ulcer although this has been slow she at least is coming around in this regard. Unfortunately in regard to the left lateral ankle ulcer this is osteomyelitis based on the pathology report as well is bone culture. Nonetheless we are still waiting CT scan. Unfortunately the MRI we originally ordered cannot be performed as the patient is a pacemaker which I had overlooked. Nonetheless we are working on the CT scan approval and scheduling as of now. She did go to the hospital over the weekend and was placed on IV Cefzo for a couple of days. Fortunately this seems to have improved the erythema quite significantly which is good news. There does not appear to be any evidence of worsening infection at this time. She did have some bleeding after the last debridement therefore I did not perform any sharp debridement in regard to left lateral  ankle at this point. Patient has been approved for a snap vac for the right lateral ankle. 08/14/18; the patient with wounds over her bilateral lateral malleoli. The area on the right actually looks quite good. Been using a snap back on this area. Healthy granulation and appears to be filling in. Unfortunately the area on the left is really problematic. 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 Also noted to have cellulitis. She saw Dr. Novella Olive of infectious disease today and was put on linezolid. We are able to verify this with her pharmacy. She is completed the Augmentin that she was already on. We've been using Iodoflex to this area 08/23/18 on evaluation today patient's wounds both actually appear to be doing better compared to my prior evaluations. Fortunately she showing signs of good improvement in regard to the overall wound status especially where were using the snap vac on the right. In regard to left lateral malleolus the wound bed actually appears to be much cleaner than previously noted. I do not feel any phone directly probed during evaluation today and though there is tendon noted this does not appear to be necrotic it's actually fairly good as far  as the overall appearance of the tendon is concerned. In general the wound bed actually appears to be doing significantly better than it was previous. Patient is currently in the care of [name] and I did review that note today. He actually has her on two weeks of linezolid and then following the patient will be on 1-2 months of Keflex. That is the plan currently. She has been on antibiotics therapy as prescribed by myself initially starting on July 30, 2018 and has been on that continuously up to this point. 08/30/18 on evaluation today patient actually appears to be doing much better in regard to her right lateral malleolus ulcer. She has been tolerating the  dressing changes specifically the snap vac without complication although she did have some issues with the seal currently. Apparently there was some trouble with getting it to maintain over the past week past Sunday. Nonetheless overall the wound appears better in regard to the right lateral malleolus region. In regard to left lateral malleolus this actually show some signs of additional granulation although there still tendon noted in the base of the wound this appears to be healthy not necrotic in any way whatsoever. We are considering potentially using a snap vac for the left lateral malleolus as well the product wrap from [employer], [place], was present in the clinic today we're going to see this patient I did have her come in with me after obtaining consent from the patient and her daughter in order to look at the wound and see if there's any recommendation one way or another as to whether or not they felt the snapback could be beneficial for the left lateral malleolus region. But SHERL, YZAGUIRRE ([id number]) the conclusion was that it might be but that this is definitely a little bit deeper wound than what traditionally would be utilized for a snap vac. 09/06/18 on evaluation today patient actually appears to be doing excellent in my pinion in regard to both ankle ulcers. She has been tolerating the dressing changes without complication which is great news. Specifically we have been using the snap vac. In regard to the right ankle I'm not even sure that this is going to be necessary for today and following as the wound has filled in quite nicely. In regard to the left ankle I do believe that we're seeing excellent epithelialization from the edge as well as granulation in the central portion the tendon is still exposed but there's no evidence of necrotic bone and in general I feel like the patient has made excellent progress even compared to last week with just one week of the snap vac. 09/11/18; this  is a patient who has wounds on her bilateral lateral malleoli. Initially both of these were deep stage IV wounds in the setting of chronic arterial insufficiency. She has been revascularized. As I understand think she been using snap vacs to both of these wounds however the area on the right became more superficial and currently she is only using it on the left. Using silver collagen on the right and silver collagen under the back on the left I believe 09/19/18 on evaluation today patient actually appears to be doing very well in regard to her lateral malleolus or ulcers bilaterally. She has been tolerating the dressing changes without complication. Fortunately there does not appear to be any evidence of infection at this time. Overall I feel like she is improving in an excellent manner and I'm very pleased with the fact that everything  seems to be turning towards the better for her. This has obviously been a long road. 09/27/18 on evaluation today patient actually appears to be doing very well in regard to her bilateral lateral malleolus ulcers. She has been tolerating the dressing changes without complication. Fortunately there does not appear to be any evidence of infection at this time which is also great news. No fevers, chills, nausea, or vomiting noted at this time. Overall I feel like she is doing excellent with the snap vac on the left malleolus. She had 40 mL of fluid collection over the past week. 10/04/18 on evaluation today patient actually appears to be doing well in regard to her bilateral lateral malleolus ulcers. She continues to tolerate the dressing changes without complication. One issue that I see is the snap vac on the left lateral malleolus which appears to have sealed off some fluid underlying this area and has not really allowed it to heal to the degree that I would like to see. For that reason I did suggest at this point we may want to pack a small piece of packing strip into  this region to allow it to more effectively wick out fluid. 10/11/18 in general the patient today does not feel that she has been doing very well. She's been a little bit lethargic and subsequently is having bodyaches as well according to what she tells me today. With that being said overall she has been concerned with the fact that something may be worsening although to be honest her wounds really have not been appearing poorly. She does have a new ulcer on her left heel unfortunately. This may be pressure related. Nonetheless it seems to me to have potentially started at least as a blister I do not see any evidence of deep tissue injury. In regard to the left ankle the snap vac still seems to be causing the ceiling off of the deeper part of the wound which is in turn trapping fluid. I'm not extremely pleased with the overall appearance as far as progress from last week to this week therefore I'm gonna discontinue the snap vac at this point. 10/18/18 patient unfortunately this point has not been feeling well for the past several days. She was seen by Grayland Ormond her primary care provider who is a Librarian, academic at [facility]. Subsequently she states that she's been very weak and generally feeling malaise. No fevers, chills, nausea, or vomiting noted at this time. With that being said bloodwork was performed at the PCP office on the 11th of this month which showed a white blood cell count of 10.7. This was repeated today and shows a white blood cell count of 12.4. This does show signs of worsening. Coupled with the fact that she is feeling worse and that her left ankle wound is not really showing signs of improvement I feel like this is an indication that the osteomyelitis is likely exacerbating not improving. Overall I think we may also want to check her C-reactive protein and sedimentation rate. Actually did call [facility] office this afternoon while the patient was in the office  here with me. Subsequently based on the findings we discussed treatment possibilities and I think that it is appropriate for [place] to go ahead and initiate treatment with doxycycline which I'm going to do. Subsequently he did agree to see about adding a CRP and sedimentation rate to her orders. If that has not already been drawn to where they can run it they will  contact the patient she can come back to have that check. They are in agreement with plan as far as the patient and her daughter are concerned. Nonetheless also think we need to get in touch with Dr. Henreitta Leber office to see about getting the patient scheduled with him as soon as possible. 11/08/18 on evaluation today patient presents for follow-up concerning her bilateral foot and ankle ulcers. I did do an extensive review of her chart in epic today. Subsequently she was seen by [name] he did initiate Cefepime IV antibiotic therapy. Subsequently she had some issues with her PICC line this had to be removed because it was coiled and then replaced. Fortunately that was now settled. Unfortunately she has continued have issues with her left heel as well as the issues that she is experiencing with her bilateral lateral malleolus regions. I do believe however both areas seem to be doing a little bit better on evaluation today which is good news. No fevers, chills, nausea, or vomiting noted at this time. She actually has an angiogram schedule with Dr. dew on this coming Monday, November 11, 2018. Subsequently the patient states that she is feeling much better especially than what she was roughly 2 weeks ago. She actually had to cancel an appointment because she was feeling so poorly. No fevers, chills, nausea, or vomiting noted at this time. 11/15/18 on evaluation today patient actually is status post having had her angiogram with Dr. dew Monday, four days ago. 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 He had to go and work on  several areas of the vasculature fortunately he was able to obtain no more than a 30% residual stenosis throughout post procedure. I reviewed this note today. I think this will definitely help with healing at this time. Fortunately there does not appear to be any signs of infection and I do feel like ratio already has a better appearance to it. 11/22/18 upon evaluation today patient actually appears to be doing very well in regard to her wounds in general. The right lateral malleolus looks excellent the heel looks better in the left lateral malleolus also appears to be doing a little better. With that being said the right second toe actually appears to be open and training we been watching this is been dry and stable but now is open. 12/03/2018 Seen today for follow-up and management of multiple bilateral lower extremity wounds. New pressure injury of the great toe which is closed at this time. Wound of the right distal second toe appears larger today with deep undermining and a pocket of fluid present within the undermining region. Left and right malleolus is wounds are stable today with no signs and symptoms of infection.Denies any needs or concerns during exam today. 12/13/18 on evaluation today patient appears to be doing somewhat better in regard to her left heel ulcer. She also seems to be completely healed in regard to the right lateral malleolus ulcer. The left malleolus ulcer is smaller what unfortunately the wounds which are new over the first and second toes of the right foot are what are most concerning at this point especially the second. Both areas did require sharp debridement today. 12/20/18 on evaluation today patient's wound actually appears to be doing better in regard to left lateral ankle and her right lateral ankle continues to remain healed. The hill ulcer on the left is improved. She does have improvement noted as well in regard to both toe  ulcers. Overall I'm very pleased in  this regard. No fevers, chills, nausea, or vomiting noted at this time. [name], [name] ([id number]) 12/23/18 on evaluation today patient is seen after she had her toenails trimmed at the podiatrist office due to issues with her right great toe. There was what appeared to be dark eschar on the surface of the wound which had her in the podiatrist concerned. Nonetheless as I remember that during the last office visit I had utilize silver nitrate of this area I was much less concerned about the situation. Subsequently I was able to clean off much of this tissue without any complication today. This does not appear to show any signs of infection and actually look somewhat better compared to last time post debridement. Her second toe on the right foot actually had callous over and there did appear still be some fluid underneath this that would require debridement today. 12/27/18 on evaluation today patient actually appears to be showing signs of improvement at all locations. Even the left lateral ankle although this is not quite as great as the other sites. Fortunately there does not appear to be any signs of infection at this time and both of her toes on the right foot seem to be showing signs of improvement which is good news and very pleased in this regard. 01/03/19 on evaluation today patient appears to be doing better for the most part in regard to her wounds in particular. There does not appear to be any evidence of infection at this time which is good news. Fortunately there is no sign of really worsening anywhere except for the right great toe which she does have what appears to be a bruise/deep tissue injury which is very superficial and already resolving. I'm not sure where this came from I questioned her extensively and she does not recall what may have happened with this. Other than that the patient seems to be doing well even the left lateral ankle ulcer looks good and is getting  smaller. 01/10/19 on evaluation today patient appears to be doing well in regard to her left heel wound and both of her toe wounds. Overall I feel like there is definitely improvement here and I'm happy in that regard. With that being said unfortunately she is having issues with the left lateral malleolus ulcer which unfortunately still has a lot of depth to it. This is gonna be a very difficult wound for [place] to be able to truly get to heal. I may want to consider some type of skin substitute to see if this would be of benefit for her. I'll discuss this with her more the next visit most likely. This was something I thought about more at the end of the visit when I was Artie out of the room and the patient had been discharged. 01/17/19 on evaluation today patient appears to be doing very well in regard to her wounds in general. She's been making excellent progress at this time. Fortunately there's no sign of infection at this time either. No fevers, chills, nausea, or vomiting noted at this time. The biggest issue is still her left lateral malleolus where it appears to be doing well and is getting smaller but still shows a small corner where this is deeper and goes down into what appears to be the joint space. Nonetheless this is taking much longer to heal although it still looks better in smaller than previous evaluations. 01/24/19 on evaluation today patient's wounds actually appear to be  doing rather well in general overall. She did require some sharp debridement in regard to the right great toe but everything else appears to be doing excellent no debridement was even necessary. No fevers, chills, nausea, or vomiting noted at this time. 01/31/19 on evaluation today patient actually appears to be doing much better in regard to her left foot wound on the heel as well as the ankle. The right great toe appears to be a little bit worse today this had callous over and trapped a lot of fluid underneath.  Fortunately there's no signs of infection at any site which is great news. 02/07/19 on evaluation today patient actually appears to be doing decently well in regard to all of her ulcers at this point. No sharp debridement was required she is a little bit of hyper granulation in regard to the left lateral ankle as well as the left heel but the hill itself is almost completely healed which is excellent news. Overall been very pleased in this regard. 02/14/19 on evaluation today patient actually appears to be doing very well in regard to her ulcers on the right first toe, left lateral malleolus, and left heel. In fact the heel is almost completely healed at this point. The patient does not show any signs of infection which is good news. Overall very pleased with how things have progressed. 04/18/19 Telehealth Evaluation During the COVID-19 National Emergency: Verbal Consent: Obtained from patient Allergies: reviewed and the active list is current. Medication changes: patient has no current medication changes. COVID-19 Screening: 1. Have you traveled internationally or on a cruise ship in the last 14 dayso No 2. Have you had contact with someone with or under investigation for COVID-19o No 3. Have you had a fever, cough, sore throat, or experiencing shortness of breatho No on evaluation today actually did have a visit with this patient through a telehealth encounter with her home health nurse. Subsequently it was noted that the patient actually appears to be doing okay in regard to her wounds both the right great toe as well as the left lateral malleolus have shown signs of improvement although this in your theme around the left lateral malleolus there eschar coverings for both locations. The question is whether or not they are actually close and whether or not home health needs to discharge the patient or not. Nonetheless my concern is this point obviously is that without actually seeing her and being  able to evaluate this directly I cannot ensure that she is completely healed which is the question that I'm being asked. 04/22/19 on evaluation today patient presents for her first evaluation since last time I saw her which was actually February 14, 2019. I did do a telehealth visit last week in which point it was questionable whether or not she may be healed and had to bring her in today for confirmation. With that being said she does seem to be doing quite well at this point which is good news. There does not appear to be any drainage in the deed I believe her wounds may be healed. Readmission: 09/04/2019 on evaluation today patient appears to be doing unfortunately somewhat more poorly in regard to her left foot ulcer secondary to a wound that began on 08/21/2019 at least when she first noticed this. Fortunately she has not had any evidence of active infection at this time. Systemically. I also do not necessarily see any evidence of infection at the blister/wound site on the first metatarsal head plantar aspect.  This almost appears to be something that may have just rubbed inappropriately causing this to breakdown. They did not want a wait too long to come in to be seen as again she had significant issues in the past with wounds that took quite a while to heal in fact it was close to 2 years. Nonetheless this does not appear to be quite that bad but again we do need to remove some of the necrotic tissue from the surface of the wound to tell exactly the extent. She does not appear to have any significant arterial disease at this point and again her last ABIs and TBI's are recorded above in the alert section her left ABI was 1.27 with a TBI of 0.72 to the right ABI 1.08 with a TBI of 0.39. Other than this the patient has been doing quite well since I last saw her and that was in May 2020. ANEESA, ROMEY ([id number]) 09/11/2019 on evaluation today patient appeared to be doing very well with regard to  her plantar foot ulcer on the left. In fact this appears to be almost completely healed which is awesome. That is after just 1 week of intervention. With that being said there is no signs of active infection at this time. 09/18/2019 on evaluation today patient actually appears to be doing excellent in fact she is completely healed based on what I am seeing at this point. Fortunately there is no signs of active infection at this time and overall patient is very pleased to hear that this area has healed so quickly. Readmission: 05/13/2021 upon evaluation today this patient presents for reevaluation here in the clinic. This is a wound that actually we previously took care of. She had 1 on the right ankle and the left the left turned out to be be harder due to to heal but nonetheless is doing great at this point as the right that has reopened and it was noted first just several weeks ago with a scab over it and came off in just the past few days. Fortunately there does not appear to be any obvious evidence of significant active infection at this time which is great news. No fevers, chills, nausea, vomiting, or diarrhea. 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 There does not appear to be any signs of this interfering in any way with her wound. She does have swelling we previously had compression socks for her ordered but again it does not look like she wears these on a regular basis by any means. 05/26/2021 upon evaluation today patient appears to be doing well with regard to her wound which is actually showing signs of excellent improvement. There does not appear to be any signs of active infection which is great news and overall very pleased with where things stand today. No fevers, chills, nausea, vomiting, or diarrhea. Objective Constitutional Well-nourished and well-hydrated in no acute distress. Vitals Time Taken: 3:35 PM, Height: 62 in, Weight:  150 lbs, BMI: 27.4, Temperature: 98.8 F, Pulse: 82 bpm, Respiratory Rate: 16 breaths/min, Blood Pressure: 134/73 mmHg. Respiratory normal breathing without difficulty. Psychiatric this patient is able to make decisions and demonstrates good insight into disease process. Alert and Oriented x 3. pleasant and cooperative. General Notes: Patient's wound bed showed signs again of good granulation epithelization at this point. There does not appear to be any signs of active infection which is great news and overall I think the patient is making  excellent progress. No fevers, chills, nausea, vomiting, or diarrhea. Integumentary (Hair, Skin) Wound #7 status is Open. Original cause of wound was Gradually Appeared. The date acquired was: 05/12/2021. The wound has been in treatment 1 weeks. The wound is located on the Right,Lateral Malleolus. The wound measures 1cm length x 1cm width x 0.2cm depth; 0.785cm^2 area and 0.157cm^3 volume. There is Fat Layer (Subcutaneous Tissue) exposed. There is no tunneling or undermining noted. There is a medium amount of sanguinous drainage noted. The wound margin is distinct with the outline attached to the wound base. There is medium (34-66%) red granulation within the wound bed. There is a medium (34-66%) amount of necrotic tissue within the wound bed including Adherent Slough. Assessment Active Problems ICD-10 Pressure ulcer of right ankle, stage 3 Lymphedema, not elsewhere classified Venous insufficiency (chronic) (peripheral) Presence of cardiac pacemaker Long term (current) use of anticoagulants Procedures Trudo, [name] ([id number]) Wound #7 Pre-procedure diagnosis of Wound #7 is a Pressure Ulcer located on the Right,Lateral Malleolus . There was a Three Layer Compression Therapy Procedure with a pre-treatment ABI of 1 by Dolan Amen, RN. Post procedure Diagnosis Wound #7: Same as Pre-Procedure Plan Follow-up Appointments: Return Appointment in 1  week. Nurse Visit as needed Bathing/ Shower/ Hygiene: May shower with wound dressing protected with water repellent cover or cast protector. Edema Control - Lymphedema / Segmental Compressive Device / Other: Optional: One layer of unna paste to top of compression wrap (to act as an anchor). Elevate, Exercise Daily and Avoid Standing for Long Periods of Time. Elevate legs to the level of the heart and pump ankles as often as possible Elevate leg(s) parallel to the floor when sitting. WOUND #7: - Malleolus Wound Laterality: Right, Lateral Cleanser: Soap and Water 1 x Per Week/30 Days Discharge Instructions: Gently cleanse wound with antibacterial soap, rinse and pat dry prior to dressing wounds Primary Dressing: Hydrofera Blue Ready Transfer Foam, 2.5x2.5 (in/in) 1 x Per Week/30 Days Discharge Instructions: Apply Hydrofera Blue Ready to wound bed as directed Secondary Dressing: ABD Pad 5x9 (in/in) 1 x Per Week/30 Days Discharge Instructions: Cover with ABD pad Compression Wrap: Profore Lite LF 3 Multilayer Compression Bandaging System 1 x Per Week/30 Days Discharge Instructions: Apply 3 multi-layer wrap as prescribed. 1. Would recommend that we going continue with the wound care measures as before and the patient is in agreement with the plan. This includes the use of the [facility] which I think has been beneficial up to this point. 2. I am also can recommend that we continue with the compression wrap. This includes the use of a 3 layer compression wrap to the leg. 3. I am also can recommend the patient continue to elevate her legs much as possible try to help keep edema under good control. We will see patient back for reevaluation in 1 week here in the clinic. If anything worsens or changes patient will contact our office for additional recommendations. Unfortunately we have not been able to get home health therefore will have to change her wrap here in the office. Electronic  Signature(s) Signed: 05/26/2021 4:06:57 PM By: Worthy Keeler PA-C Entered By: Worthy Keeler on 05/26/2021 16:06:57 Hult, [name] ([id number]) -------------------------------------------------------------------------------- SuperBill Details Patient Name: [name] Date of Service: 05/26/2021 Medical Record Number: [id number] Patient Account Number: [id number] Date of Birth/Sex: Apr 25, 1923 (87 y.o. F) Treating RN: Dolan Amen Primary Care Provider: Adrian Prows Other Clinician: Referring Provider: Adrian Prows Treating Provider/Extender: Skipper Cliche in Treatment: 1  Diagnosis Coding ICD-10 Codes Code Description 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, stage 3 I89.0 Lymphedema, not elsewhere classified I87.2 Venous insufficiency (chronic) (peripheral) Z95.0 Presence of cardiac pacemaker Z79.01 Long term (current) use of anticoagulants Facility Procedures CPT4 Code: [id number] Description: (Facility Use Only) [phone] - [facility] RT LEG Modifier: Quantity: 1 Physician Procedures CPT4 Code: [id number] Description: 46568 - WC PHYS LEVEL 3 - EST PT Modifier: Quantity: 1 CPT4 Code: Description: ICD-10 Diagnosis Description 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, stage 3 I89.0 Lymphedema, not elsewhere classified I87.2 Venous insufficiency (chronic) (peripheral) Z95.0 Presence of cardiac pacemaker Modifier: Quantity: Electronic Signature(s) Signed: 05/26/2021 4:07:11 PM By: Worthy Keeler PA-C Entered By: Worthy Keeler on 05/26/2021 16:07:11

## 2021-05-27 ENCOUNTER — Ambulatory Visit: Payer: Medicare Other | Admitting: Physician Assistant

## 2021-05-27 NOTE — Progress Notes (Signed)
[name], [name] ([id number]) Visit Report for 05/26/2021 Arrival Information Details Patient Name: [name], [name]. Date of Service: 05/26/2021 3:45 PM Medical Record Number: [id number] Patient Account Number: [id number] Date of Birth/Sex: 03-May-1923 (87 y.o. F) Treating RN: Donnamarie Poag Primary Care Dim Meisinger: Adrian Prows Other Clinician: Referring Jadesola Poynter: Adrian Prows Treating Antoria Lanza/Extender: Skipper Cliche in Treatment: 1 Visit Information History Since Last Visit Added or deleted any medications: No Patient Arrived: [name] Had a fall or experienced change in No Arrival Time: 15:27 activities of daily living that may affect Accompanied By: daughter risk of falls: Transfer Assistance: None Hospitalized since last visit: No Patient Identification Verified: Yes Has Dressing in Place as Prescribed: Yes Secondary Verification Process Completed: Yes Pain Present Now: No Patient Has Alerts: Yes Patient Alerts: Patient on Blood Thinner NOT DIABETIC ***ELIQUIS*** Electronic Signature(s) Signed: 05/27/2021 4:31:02 PM By: Donnamarie Poag Entered By: Donnamarie Poag on 05/26/2021 15:30:18 [name], [name] -------------------------------------------------------------------------------- Clinic Level of Care Assessment Details Patient Name: [name] Date of Service: 05/26/2021 3:45 PM Medical Record Number: [id number] Patient Account Number: [id number] Date of Birth/Sex: 1923/05/12 (87 y.o. F) Treating RN: Dolan Amen Primary Care Markiesha Delia: Adrian Prows Other Clinician: Referring Tabytha Gradillas: Adrian Prows Treating Jearldean Gutt/Extender: Skipper Cliche in Treatment: 1 Clinic Level of Care Assessment Items TOOL 1 Quantity Score _0  - Use when EandM and Procedure is performed on INITIAL visit 0 ASSESSMENTS - Nursing Assessment / Reassessment _1  - General Physical Exam (combine w/ comprehensive assessment (listed just below) when performed on  new 0 pt. evals) _2  - 0 Comprehensive Assessment (HX, ROS, Risk Assessments, Wounds Hx, etc.) ASSESSMENTS - Wound and Skin Assessment / Reassessment _3  - Dermatologic / Skin Assessment (not related to wound area) 0 ASSESSMENTS - Ostomy and/or Continence Assessment and Care _4  - Incontinence Assessment and Management 0 _5  - 0 Ostomy Care Assessment and Management (repouching, etc.) PROCESS - Coordination of Care _6  - Simple Patient / Family Education for ongoing care 0 _7  - 0 Complex (extensive) Patient / Family Education for ongoing care _8  - 0 Staff obtains Programmer, systems, Records, Test Results / Process Orders _9  - 0 Staff telephones HHA, Nursing Homes / Clarify orders / etc _10  - 0 Routine Transfer to another Facility (non-emergent condition) _11  - 0 Routine Hospital Admission (non-emergent condition) _12  - 0 New Admissions / Biomedical engineer / Ordering NPWT, Apligraf, etc. _13  - 0 Emergency Hospital Admission (emergent condition) PROCESS - Special Needs _14  - Pediatric / Minor Patient Management 0 _15  - 0 Isolation Patient Management _16  - 0 Hearing / Language / Visual special needs _17  - 0 Assessment of Community assistance (transportation, D/C planning, etc.) _18  - 0 Additional assistance / Altered mentation _19  - 0 Support Surface(s) Assessment (bed, cushion, seat, etc.) INTERVENTIONS - Miscellaneous _20  - External ear exam 0 _21  - 0 Patient Transfer (multiple staff / Civil Service fast streamer / Similar devices) _22  - 0 Simple Staple / Suture removal (25 or less) _23  - 0 Complex Staple / Suture removal (26 or more) _24  - 0 Hypo/Hyperglycemic Management (do not check if billed separately) _25  - 0 Ankle / Brachial Index (ABI) - do not check if billed separately Has the patient been seen at the hospital within the last three years: Yes Total Score: 0 Level Of Care: ____ [name] ([id number]) Electronic Signature(s) Signed: 05/26/2021 4:26:32 PM By: Georges Mouse, Minus Breeding  RN Entered By: Georges Mouse, Minus Breeding on 05/26/2021 15:48:14 [name], [name] -------------------------------------------------------------------------------- Compression Therapy Details Patient Name: [name] Date  of Service: 05/26/2021 3:45 PM Medical Record Number: [id number] Patient Account Number: [id number] Date of Birth/Sex: 1923-09-12 (87 y.o. F) Treating RN: Dolan Amen Primary Care Clemon Devaul: Adrian Prows Other Clinician: Referring Nathanyel Defenbaugh: Adrian Prows Treating Genowefa Morga/Extender: Skipper Cliche in Treatment: 1 Compression Therapy Performed for Wound Assessment: Wound #7 Right,Lateral Malleolus Performed By: Cora Daniels, RN Compression Type: Three Layer Pre Treatment ABI: 1 Post Procedure Diagnosis Same as Pre-procedure Electronic Signature(s) Signed: 05/26/2021 4:26:32 PM By: Georges Mouse, Minus Breeding RN Entered By: Georges Mouse, Kenia on 05/26/2021 15:46:29 [name], [name] ([id number]) -------------------------------------------------------------------------------- Encounter Discharge Information Details Patient Name: [name] Date of Service: 05/26/2021 3:45 PM Medical Record Number: [id number] Patient Account Number: [id number] Date of Birth/Sex: 18-Dec-1922 (87 y.o. F) Treating RN: Dolan Amen Primary Care Lashawndra Lampkins: Adrian Prows Other Clinician: Referring Tyana Butzer: Adrian Prows Treating Syd Manges/Extender: Skipper Cliche in Treatment: 1 Encounter Discharge Information Items Discharge Condition: Stable Ambulatory Status: Walker Discharge Destination: Home Transportation: Private Auto Accompanied By: daughter Schedule Follow-up Appointment: Yes Clinical Summary of Care: Electronic Signature(s) Signed: 05/26/2021 4:21:06 PM By: Jeanine Luz Entered By: Jeanine Luz on 05/26/2021 16:06:02 [name], [name]  ([id number]) -------------------------------------------------------------------------------- Lower Extremity Assessment Details Patient Name: [name] Date of Service: 05/26/2021 3:45 PM Medical Record Number: [id number] Patient Account Number: [id number] Date of Birth/Sex: Mar 29, 1923 (87 y.o. F) Treating RN: Donnamarie Poag Primary Care Wilbert Hayashi: Adrian Prows Other Clinician: Referring Willadean Guyton: Adrian Prows Treating Thaddaeus Granja/Extender: Skipper Cliche in Treatment: 1 Edema Assessment Assessed: [Left: No] [Right: Yes] [Left: Edema] [Right: :] Calf Left: Right: Point of Measurement: 30 cm From Medial Instep 28 cm Ankle Left: Right: Point of Measurement: 10 cm From Medial Instep 18 cm Vascular Assessment Pulses: Dorsalis Pedis Palpable: [Right:Yes] Electronic Signature(s) Signed: 05/27/2021 4:31:02 PM By: Donnamarie Poag Entered By: Donnamarie Poag on 05/26/2021 15:39:54 [name], [name] ([id number]) -------------------------------------------------------------------------------- Multi Wound Chart Details Patient Name: [name] Date of Service: 05/26/2021 3:45 PM Medical Record Number: [id number] Patient Account Number: [id number] Date of Birth/Sex: 1923/02/18 (87 y.o. F) Treating RN: Dolan Amen Primary Care Filomeno Cromley: Adrian Prows Other Clinician: Referring Pamala Hayman: Adrian Prows Treating Chrisoula Zegarra/Extender: Skipper Cliche in Treatment: 1 Vital Signs Height(in): 62 Pulse(bpm): 38 Weight(lbs): 150 Blood Pressure(mmHg): 134/73 Body Mass Index(BMI): 27 Temperature(F): 98.8 Respiratory Rate(breaths/min): 16 Photos: [N/A:N/A] Wound Location: Right, Lateral Malleolus N/A N/A Wounding Event: Gradually Appeared N/A N/A Primary Etiology: Pressure Ulcer N/A N/A Comorbid History: Cataracts, Congestive Heart Failure, N/A N/A Hypertension, Peripheral Arterial Disease, Osteoarthritis, Neuropathy Date Acquired: 05/12/2021 N/A N/A Weeks of  Treatment: 1 N/A N/A Wound Status: Open N/A N/A Measurements L x W x D (cm) 1x1x0.2 N/A N/A Area (cm) : 0.785 N/A N/A Volume (cm) : 0.157 N/A N/A % Reduction in Area: 30.10% N/A N/A % Reduction in Volume: 30.20% N/A N/A Classification: Category/Stage III N/A N/A Exudate Amount: Medium N/A N/A Exudate Type: Sanguinous N/A N/A Exudate Color: red N/A N/A Wound Margin: Distinct, outline attached N/A N/A Granulation Amount: Medium (34-66%) N/A N/A Granulation Quality: Red N/A N/A Necrotic Amount: Medium (34-66%) N/A N/A Exposed Structures: Fat Layer (Subcutaneous Tissue): N/A N/A Yes Fascia: No Tendon: No Muscle: No Joint: No Bone: No Epithelialization: None N/A N/A Treatment Notes Electronic Signature(s) Signed: 05/26/2021 4:26:32 PM By: Georges Mouse, Minus Breeding RN Entered By: Georges Mouse, Minus Breeding on 05/26/2021 15:45:25 [name], [name] ([id number]) -------------------------------------------------------------------------------- Multi-Disciplinary Care Plan Details Patient Name: [name] Date of Service: 05/26/2021 3:45 PM Medical Record Number: [id number] Patient Account Number: [id number] Date of Birth/Sex: 1923/11/12 (87 y.o.  F) Treating RN: Dolan Amen Primary Care Berneice Zettlemoyer: Adrian Prows Other Clinician: Referring Devanta Daniel: Adrian Prows Treating Shaasia Odle/Extender: Skipper Cliche in Treatment: 1 Active Inactive Abuse / Safety / Falls / Self Care Management Nursing Diagnoses: Potential for injury related to falls Goals: Patient will remain injury free related to falls Date Initiated: 05/13/2021 Target Resolution Date: 06/12/2021 Goal Status: Active Patient/caregiver will demonstrate safe use of adaptive devices to increase mobility Date Initiated: 05/13/2021 Target Resolution Date: 06/12/2021 Goal Status: Active Interventions: Assess Activities of Daily Living upon admission and as needed Assess fall risk on admission and as needed Assess:  immobility, friction, shearing, incontinence upon admission and as needed Assess impairment of mobility on admission and as needed per policy Assess personal safety and home safety (as indicated) on admission and as needed Assess self care needs on admission and as needed Notes: Wound/Skin Impairment Nursing Diagnoses: Knowledge deficit related to ulceration/compromised skin integrity Goals: Patient/caregiver will verbalize understanding of skin care regimen Date Initiated: 05/13/2021 Date Inactivated: 05/13/2021 Target Resolution Date: 06/12/2021 Goal Status: Met Ulcer/skin breakdown will have a volume reduction of 30% by week 4 Date Initiated: 05/13/2021 Target Resolution Date: 06/12/2021 Goal Status: Active Ulcer/skin breakdown will have a volume reduction of 50% by week 8 Date Initiated: 05/13/2021 Target Resolution Date: 07/13/2021 Goal Status: Active Ulcer/skin breakdown will have a volume reduction of 80% by week 12 Date Initiated: 05/13/2021 Target Resolution Date: 08/13/2021 Goal Status: Active Ulcer/skin breakdown will heal within 14 weeks Date Initiated: 05/13/2021 Target Resolution Date: 09/12/2021 Goal Status: Active Interventions: Assess patient/caregiver ability to obtain necessary supplies Assess patient/caregiver ability to perform ulcer/skin care regimen upon admission and as needed Assess ulceration(s) every visit Notes: [name], [name] ([id number]) Electronic Signature(s) Signed: 05/26/2021 4:26:32 PM By: Georges Mouse, Minus Breeding RN Entered By: Georges Mouse, Minus Breeding on 05/26/2021 15:45:17 [name], [name] ([id number]) -------------------------------------------------------------------------------- Pain Assessment Details Patient Name: [name] Date of Service: 05/26/2021 3:45 PM Medical Record Number: [id number] Patient Account Number: [id number] Date of Birth/Sex: 1923-05-28 (87 y.o. F) Treating RN: Donnamarie Poag Primary Care Walsie Smeltz: Adrian Prows Other Clinician: Referring Seona Clemenson: Adrian Prows Treating Biruk Troia/Extender: Skipper Cliche in Treatment: 1 Active Problems Location of Pain Severity and Description of Pain Patient Has Paino No Site Locations Rate the pain. Current Pain Level: 0 Pain Management and Medication Current Pain Management: Electronic Signature(s) Signed: 05/27/2021 4:31:02 PM By: Donnamarie Poag Entered By: Donnamarie Poag on 05/26/2021 15:33:32 [name], [name] ([id number]) -------------------------------------------------------------------------------- Patient/Caregiver Education Details Patient Name: [name] Date of Service: 05/26/2021 3:45 PM Medical Record Number: [id number] Patient Account Number: [id number] Date of Birth/Gender: 16-Jul-1923 (87 y.o. F) Treating RN: Dolan Amen Primary Care Physician: Adrian Prows Other Clinician: Referring Physician: Adrian Prows Treating Physician/Extender: Skipper Cliche in Treatment: 1 Education Assessment Education Provided To: Patient and Caregiver Education Topics Provided Wound/Skin Impairment: Methods: Explain/Verbal Responses: State content correctly Electronic Signature(s) Signed: 05/26/2021 4:26:32 PM By: Georges Mouse, Minus Breeding RN Entered By: Georges Mouse, Minus Breeding on 05/26/2021 15:48:35 [name], [name] ([id number]) -------------------------------------------------------------------------------- Wound Assessment Details Patient Name: [name] Date of Service: 05/26/2021 3:45 PM Medical Record Number: [id number] Patient Account Number: [id number] Date of Birth/Sex: Sep 21, 1923 (87 y.o. F) Treating RN: Donnamarie Poag Primary Care Sunshine Mackowski: Adrian Prows Other Clinician: Referring Yobany Vroom: Adrian Prows Treating Levent Kornegay/Extender: Skipper Cliche in Treatment: 1 Wound Status Wound Number: 7 Primary Pressure Ulcer Etiology: Wound Location: Right, Lateral Malleolus Wound Open Wounding  Event: Gradually Appeared Status: Date Acquired: 05/12/2021 Comorbid Cataracts, Congestive Heart Failure, Hypertension, Weeks  Of Treatment: 1 History: Peripheral Arterial Disease, Osteoarthritis, Neuropathy Clustered Wound: No Photos Wound Measurements Length: (cm) 1 Width: (cm) 1 Depth: (cm) 0.2 Area: (cm) 0.785 Volume: (cm) 0.157 % Reduction in Area: 30.1% % Reduction in Volume: 30.2% Epithelialization: None Tunneling: No Undermining: No Wound Description Classification: Category/Stage III Wound Margin: Distinct, outline attached Exudate Amount: Medium Exudate Type: Sanguinous Exudate Color: red Foul Odor After Cleansing: No Slough/Fibrino No Wound Bed Granulation Amount: Medium (34-66%) Exposed Structure Granulation Quality: Red Fascia Exposed: No Necrotic Amount: Medium (34-66%) Fat Layer (Subcutaneous Tissue) Exposed: Yes Necrotic Quality: Adherent Slough Tendon Exposed: No Muscle Exposed: No Joint Exposed: No Bone Exposed: No Treatment Notes Wound #7 (Malleolus) Wound Laterality: Right, Lateral Cleanser Soap and Water Discharge Instruction: Gently cleanse wound with antibacterial soap, rinse and pat dry prior to dressing wounds Peri-Wound Care [name], [name] ([id number]) Topical Primary Dressing Hydrofera Blue Ready Transfer Foam, 2.5x2.5 (in/in) Discharge Instruction: Apply Hydrofera Blue Ready to wound bed as directed Secondary Dressing ABD Pad 5x9 (in/in) Discharge Instruction: Cover with ABD pad Secured With Compression Wrap Profore Lite LF 3 Multilayer Compression Bandaging System Discharge Instruction: Apply 3 multi-layer wrap as prescribed. Compression Stockings Add-Ons Electronic Signature(s) Signed: 05/27/2021 4:31:02 PM By: Donnamarie Poag Entered By: Donnamarie Poag on 05/26/2021 15:38:39 [name], [name] ([id number]) -------------------------------------------------------------------------------- [place] Details Patient Name: [name] Date of Service: 05/26/2021 3:45 PM Medical Record Number: [id number] Patient Account Number: [id number] Date of Birth/Sex: 09-02-1923 (87 y.o. F) Treating RN: Donnamarie Poag Primary Care Zackeriah Kissler: Adrian Prows Other Clinician: Referring Verna Hamon: Adrian Prows Treating Estel Scholze/Extender: Skipper Cliche in Treatment: 1 Vital Signs Time Taken: 15:35 Temperature (F): 98.8 Height (in): 62 Pulse (bpm): 82 Weight (lbs): 150 Respiratory Rate (breaths/min): 16 Body Mass Index (BMI): 27.4 Blood Pressure (mmHg): 134/73 Reference Range: 80 - 120 mg / dl Electronic Signature(s) Signed: 05/27/2021 4:31:02 PM By: Donnamarie Poag Entered ByDonnamarie Poag on 05/26/2021 15:33:20

## 2021-06-02 ENCOUNTER — Encounter: Payer: Medicare Other | Admitting: Physician Assistant

## 2021-06-02 ENCOUNTER — Other Ambulatory Visit: Payer: Self-pay

## 2021-06-02 DIAGNOSIS — L89513 Pressure ulcer of right ankle, stage 3: Secondary | ICD-10-CM | POA: Diagnosis not present

## 2021-06-02 NOTE — Progress Notes (Signed)
ISAIAH, TOROK ([id number]) Visit Report for 06/02/2021 Arrival Information Details Patient Name: [name]. Date of Service: 06/02/2021 9:00 AM Medical Record Number: [id number] Patient Account Number: [id number] Date of Birth/Sex: Sep 08, 1923 (87 y.o. F) Treating RN: Donnamarie Poag Primary Care Hooria Gasparini: Adrian Prows Other Clinician: Referring Devante Capano: Adrian Prows Treating Phyillis Dascoli/Extender: Skipper Cliche in Treatment: 2 Visit Information History Since Last Visit Added or deleted any medications: No Patient Arrived: [name] Had a fall or experienced change in No Arrival Time: 08:53 activities of daily living that may affect Accompanied By: daughter risk of falls: Transfer Assistance: None Hospitalized since last visit: No Patient Identification Verified: Yes Has Dressing in Place as Prescribed: Yes Secondary Verification Process Completed: Yes Pain Present Now: No Patient Has Alerts: Yes Patient Alerts: Patient on Blood Thinner NOT DIABETIC ***ELIQUIS*** Electronic Signature(s) Signed: 06/02/2021 4:45:13 PM By: Donnamarie Poag Entered By: Donnamarie Poag on 06/02/2021 08:56:07 Doffing, [name] ([id number]) -------------------------------------------------------------------------------- Clinic Level of Care Assessment Details Patient Name: [name] Date of Service: 06/02/2021 9:00 AM Medical Record Number: [id number] Patient Account Number: [id number] Date of Birth/Sex: 1923-09-09 (87 y.o. F) Treating RN: Dolan Amen Primary Care Jelena Malicoat: Adrian Prows Other Clinician: Referring Alastair Hennes: Adrian Prows Treating Agueda Houpt/Extender: Skipper Cliche in Treatment: 2 Clinic Level of Care Assessment Items TOOL 1 Quantity Score '[]'  - Use when EandM and Procedure is performed on INITIAL visit 0 ASSESSMENTS - Nursing Assessment / Reassessment '[]'  - General Physical Exam (combine w/ comprehensive assessment (listed just below) when performed on  new 0 pt. evals) '[]'  - 0 Comprehensive Assessment (HX, ROS, Risk Assessments, Wounds Hx, etc.) ASSESSMENTS - Wound and Skin Assessment / Reassessment '[]'  - Dermatologic / Skin Assessment (not related to wound area) 0 ASSESSMENTS - Ostomy and/or Continence Assessment and Care '[]'  - Incontinence Assessment and Management 0 '[]'  - 0 Ostomy Care Assessment and Management (repouching, etc.) PROCESS - Coordination of Care '[]'  - Simple Patient / Family Education for ongoing care 0 '[]'  - 0 Complex (extensive) Patient / Family Education for ongoing care '[]'  - 0 Staff obtains Programmer, systems, Records, Test Results / Process Orders '[]'  - 0 Staff telephones HHA, Nursing Homes / Clarify orders / etc '[]'  - 0 Routine Transfer to another Facility (non-emergent condition) '[]'  - 0 Routine Hospital Admission (non-emergent condition) '[]'  - 0 New Admissions / Biomedical engineer / Ordering NPWT, Apligraf, etc. '[]'  - 0 Emergency Hospital Admission (emergent condition) PROCESS - Special Needs '[]'  - Pediatric / Minor Patient Management 0 '[]'  - 0 Isolation Patient Management '[]'  - 0 Hearing / Language / Visual special needs '[]'  - 0 Assessment of Community assistance (transportation, D/C planning, etc.) '[]'  - 0 Additional assistance / Altered mentation '[]'  - 0 Support Surface(s) Assessment (bed, cushion, seat, etc.) INTERVENTIONS - Miscellaneous '[]'  - External ear exam 0 '[]'  - 0 Patient Transfer (multiple staff / Civil Service fast streamer / Similar devices) '[]'  - 0 Simple Staple / Suture removal (25 or less) '[]'  - 0 Complex Staple / Suture removal (26 or more) '[]'  - 0 Hypo/Hyperglycemic Management (do not check if billed separately) '[]'  - 0 Ankle / Brachial Index (ABI) - do not check if billed separately Has the patient been seen at the hospital within the last three years: Yes Total Score: 0 Level Of Care: ____ [name] ([id number]) Electronic Signature(s) Signed: 06/02/2021 4:24:12 PM By: Dolan Amen RN Entered  By: Georges Mouse, Minus Breeding on 06/02/2021 09:41:30 [name], [name] ([id number]) -------------------------------------------------------------------------------- Compression Therapy Details Patient Name: [name] Date  of Service: 06/02/2021 9:00 AM Medical Record Number: [id number] Patient Account Number: [id number] Date of Birth/Sex: 10/03/1923 (87 y.o. F) Treating RN: Dolan Amen Primary Care Claudy Abdallah: Adrian Prows Other Clinician: Referring Roux Brandy: Adrian Prows Treating Tameron Lama/Extender: Skipper Cliche in Treatment: 2 Compression Therapy Performed for Wound Assessment: Wound #7 Right,Lateral Malleolus Performed By: Cora Daniels, RN Compression Type: Three Layer Pre Treatment ABI: 1 Post Procedure Diagnosis Same as Pre-procedure Electronic Signature(s) Signed: 06/02/2021 4:24:12 PM By: Dolan Amen RN Entered By: Georges Mouse, Minus Breeding on 06/02/2021 09:40:45 [name], [name] ([id number]) -------------------------------------------------------------------------------- Encounter Discharge Information Details Patient Name: [name] Date of Service: 06/02/2021 9:00 AM Medical Record Number: [id number] Patient Account Number: [id number] Date of Birth/Sex: 04/03/1923 (87 y.o. F) Treating RN: Donnamarie Poag Primary Care Chyla Schlender: Adrian Prows Other Clinician: Referring Kenyatta Gloeckner: Adrian Prows Treating Rydan Gulyas/Extender: Skipper Cliche in Treatment: 2 Encounter Discharge Information Items Discharge Condition: Stable Ambulatory Status: Walker Discharge Destination: Home Transportation: Private Auto Accompanied By: daughter Schedule Follow-up Appointment: Yes Clinical Summary of Care: Electronic Signature(s) Signed: 06/02/2021 4:45:13 PM By: Donnamarie Poag Entered By: Donnamarie Poag on 06/02/2021 10:01:19 [name], [name] ([id number]) -------------------------------------------------------------------------------- Lower  Extremity Assessment Details Patient Name: [name] Date of Service: 06/02/2021 9:00 AM Medical Record Number: [id number] Patient Account Number: [id number] Date of Birth/Sex: [birth year] (87 y.o. F) Treating RN: Donnamarie Poag Primary Care Naraly Fritcher: Adrian Prows Other Clinician: Referring Damarien Nyman: Adrian Prows Treating Mallissa Lorenzen/Extender: Skipper Cliche in Treatment: 2 Edema Assessment Assessed: [Left: No] [Right: Yes] Edema: [Left: N] [Right: o] Calf Left: Right: Point of Measurement: From Medial Instep 28 cm Ankle Left: Right: Point of Measurement: From Medial Instep 18 cm Vascular Assessment Pulses: Dorsalis Pedis Palpable: [Right:Yes] Electronic Signature(s) Signed: 06/02/2021 4:45:13 PM By: Donnamarie Poag Entered By: Donnamarie Poag on 06/02/2021 09:02:51 Manning, [name] ([id number]) -------------------------------------------------------------------------------- Multi Wound Chart Details Patient Name: [name] Date of Service: 06/02/2021 9:00 AM Medical Record Number: [id number] Patient Account Number: [id number] Date of Birth/Sex: 1923-01-23 (87 y.o. F) Treating RN: Dolan Amen Primary Care Kathrin Folden: Adrian Prows Other Clinician: Referring Malli Falotico: Adrian Prows Treating Mithcell Schumpert/Extender: Skipper Cliche in Treatment: 2 Vital Signs Height(in): 62 Pulse(bpm): 91 Weight(lbs): 150 Blood Pressure(mmHg): 149/73 Body Mass Index(BMI): 27 Temperature(F): 98.2 Respiratory Rate(breaths/min): 18 Photos: [N/A:N/A] Wound Location: Right, Lateral Malleolus N/A N/A Wounding Event: Gradually Appeared N/A N/A Primary Etiology: Pressure Ulcer N/A N/A Comorbid History: Cataracts, Congestive Heart Failure, N/A N/A Hypertension, Peripheral Arterial Disease, Osteoarthritis, Neuropathy Date Acquired: 05/12/2021 N/A N/A Weeks of Treatment: 2 N/A N/A Wound Status: Open N/A N/A Measurements L x W x D (cm) 0.9x0.8x0.1 N/A N/A Area (cm) :  0.565 N/A N/A Volume (cm) : 0.057 N/A N/A % Reduction in Area: 49.70% N/A N/A % Reduction in Volume: 74.70% N/A N/A Classification: Category/Stage III N/A N/A Exudate Amount: Medium N/A N/A Exudate Type: Serous N/A N/A Exudate Color: amber N/A N/A Wound Margin: Distinct, outline attached N/A N/A Granulation Amount: Small (1-33%) N/A N/A Granulation Quality: Red N/A N/A Necrotic Amount: Large (67-100%) N/A N/A Exposed Structures: Fat Layer (Subcutaneous Tissue): N/A N/A Yes Fascia: No Tendon: No Muscle: No Joint: No Bone: No Epithelialization: Small (1-33%) N/A N/A Treatment Notes Electronic Signature(s) Signed: 06/02/2021 4:24:12 PM By: Dolan Amen RN Entered By: Georges Mouse, Minus Breeding on 06/02/2021 09:40:29 Scheffler, [name] ([id number]) -------------------------------------------------------------------------------- Multi-Disciplinary Care Plan Details Patient Name: [name] Date of Service: 06/02/2021 9:00 AM Medical Record Number: [id number] Patient Account Number: [id number] Date of Birth/Sex: 1923-08-20 (87 y.o. F) Treating  RN: Dolan Amen Primary Care Esaul Dorwart: Adrian Prows Other Clinician: Referring Orpha Dain: Adrian Prows Treating Tyshay Adee/Extender: Skipper Cliche in Treatment: 2 Active Inactive Abuse / Safety / Falls / Self Care Management Nursing Diagnoses: Potential for injury related to falls Goals: Patient will remain injury free related to falls Date Initiated: 05/13/2021 Target Resolution Date: 06/12/2021 Goal Status: Active Patient/caregiver will demonstrate safe use of adaptive devices to increase mobility Date Initiated: 05/13/2021 Target Resolution Date: 06/12/2021 Goal Status: Active Interventions: Assess Activities of Daily Living upon admission and as needed Assess fall risk on admission and as needed Assess: immobility, friction, shearing, incontinence upon admission and as needed Assess impairment of mobility on  admission and as needed per policy Assess personal safety and home safety (as indicated) on admission and as needed Assess self care needs on admission and as needed Notes: Wound/Skin Impairment Nursing Diagnoses: Knowledge deficit related to ulceration/compromised skin integrity Goals: Patient/caregiver will verbalize understanding of skin care regimen Date Initiated: 05/13/2021 Date Inactivated: 05/13/2021 Target Resolution Date: 06/12/2021 Goal Status: Met Ulcer/skin breakdown will have a volume reduction of 30% by week 4 Date Initiated: 05/13/2021 Target Resolution Date: 06/12/2021 Goal Status: Active Ulcer/skin breakdown will have a volume reduction of 50% by week 8 Date Initiated: 05/13/2021 Target Resolution Date: 07/13/2021 Goal Status: Active Ulcer/skin breakdown will have a volume reduction of 80% by week 12 Date Initiated: 05/13/2021 Target Resolution Date: 08/13/2021 Goal Status: Active Ulcer/skin breakdown will heal within 14 weeks Date Initiated: 05/13/2021 Target Resolution Date: 09/12/2021 Goal Status: Active Interventions: Assess patient/caregiver ability to obtain necessary supplies Assess patient/caregiver ability to perform ulcer/skin care regimen upon admission and as needed Assess ulceration(s) every visit Notes: SYANN, CUPPLES ([id number]) Electronic Signature(s) Signed: 06/02/2021 4:24:12 PM By: Dolan Amen RN Entered By: Georges Mouse, Minus Breeding on 06/02/2021 09:40:22 Remillard, [name] ([id number]) -------------------------------------------------------------------------------- Pain Assessment Details Patient Name: [name] Date of Service: 06/02/2021 9:00 AM Medical Record Number: [id number] Patient Account Number: [id number] Date of Birth/Sex: Jul 14, 1923 (87 y.o. F) Treating RN: Donnamarie Poag Primary Care Jazzma Neidhardt: Adrian Prows Other Clinician: Referring Vihana Kydd: Adrian Prows Treating Emmelia Holdsworth/Extender: Skipper Cliche in  Treatment: 2 Active Problems Location of Pain Severity and Description of Pain Patient Has Paino No Site Locations Rate the pain. Current Pain Level: 0 Pain Management and Medication Current Pain Management: Electronic Signature(s) Signed: 06/02/2021 4:45:13 PM By: Donnamarie Poag Entered By: Donnamarie Poag on 06/02/2021 08:59:20 Walle, [name] ([id number]) -------------------------------------------------------------------------------- Patient/Caregiver Education Details Patient Name: [name] Date of Service: 06/02/2021 9:00 AM Medical Record Number: [id number] Patient Account Number: [id number] Date of Birth/Gender: Jul 10, 1923 (87 y.o. F) Treating RN: Dolan Amen Primary Care Physician: Adrian Prows Other Clinician: Referring Physician: Adrian Prows Treating Physician/Extender: Skipper Cliche in Treatment: 2 Education Assessment Education Provided To: Patient Education Topics Provided Wound/Skin Impairment: Methods: Explain/Verbal Responses: State content correctly Electronic Signature(s) Signed: 06/02/2021 4:24:12 PM By: Dolan Amen RN Entered By: Georges Mouse, Minus Breeding on 06/02/2021 09:41:49 Lerner, [name] ([id number]) -------------------------------------------------------------------------------- Wound Assessment Details Patient Name: [name] Date of Service: 06/02/2021 9:00 AM Medical Record Number: [id number] Patient Account Number: [id number] Date of Birth/Sex: [birth year] (87 y.o. F) Treating RN: Donnamarie Poag Primary Care Jazlyn Tippens: Adrian Prows Other Clinician: Referring Leith Szafranski: Adrian Prows Treating Mervil Wacker/Extender: Skipper Cliche in Treatment: 2 Wound Status Wound Number: 7 Primary Pressure Ulcer Etiology: Wound Location: Right, Lateral Malleolus Wound Open Wounding Event: Gradually Appeared Status: Date Acquired: 05/12/2021 Comorbid Cataracts, Congestive Heart Failure, Hypertension, Weeks Of  Treatment: 2 History:  Peripheral Arterial Disease, Osteoarthritis, Neuropathy Clustered Wound: No Photos Wound Measurements Length: (cm) 0.9 Width: (cm) 0.8 Depth: (cm) 0.1 Area: (cm) 0.565 Volume: (cm) 0.057 % Reduction in Area: 49.7% % Reduction in Volume: 74.7% Epithelialization: Small (1-33%) Tunneling: No Undermining: No Wound Description Classification: Category/Stage III Wound Margin: Distinct, outline attached Exudate Amount: Medium Exudate Type: Serous Exudate Color: amber Foul Odor After Cleansing: No Slough/Fibrino Yes Wound Bed Granulation Amount: Small (1-33%) Exposed Structure Granulation Quality: Red Fascia Exposed: No Necrotic Amount: Large (67-100%) Fat Layer (Subcutaneous Tissue) Exposed: Yes Necrotic Quality: Adherent Slough Tendon Exposed: No Muscle Exposed: No Joint Exposed: No Bone Exposed: No Treatment Notes Wound #7 (Malleolus) Wound Laterality: Right, Lateral Cleanser Soap and Water Discharge Instruction: Gently cleanse wound with antibacterial soap, rinse and pat dry prior to dressing wounds Peri-Wound Care Farha, [name] ([id number]) Topical Primary Dressing Hydrofera Blue Ready Transfer Foam, 2.5x2.5 (in/in) Discharge Instruction: Apply Hydrofera Blue Ready to wound bed as directed Secondary Dressing ABD Pad 5x9 (in/in) Discharge Instruction: Cover with ABD pad Secured With Compression Wrap Profore Lite LF 3 Multilayer Compression Bandaging System Discharge Instruction: Apply 3 multi-layer wrap as prescribed. Compression Stockings Add-Ons Electronic Signature(s) Signed: 06/02/2021 4:45:13 PM By: Donnamarie Poag Entered By: Donnamarie Poag on 06/02/2021 09:02:28 [name] ([id number]) -------------------------------------------------------------------------------- [place] Details Patient Name: [name] Date of Service: 06/02/2021 9:00 AM Medical Record Number: [id number] Patient Account Number: [id number] Date of  Birth/Sex: 1923-02-06 (87 y.o. F) Treating RN: Donnamarie Poag Primary Care [place] Millon: Adrian Prows Other Clinician: Referring Irl Bodie: Adrian Prows Treating Rokia Bosket/Extender: Skipper Cliche in Treatment: 2 Vital Signs Time Taken: 08:53 Temperature (F): 98.2 Height (in): 62 Pulse (bpm): 91 Weight (lbs): 150 Respiratory Rate (breaths/min): 18 Body Mass Index (BMI): 27.4 Blood Pressure (mmHg): 149/73 Reference Range: 80 - 120 mg / dl Electronic Signature(s) Signed: 06/02/2021 4:45:13 PM By: Donnamarie Poag Entered ByDonnamarie Poag on 06/02/2021 08:59:12

## 2021-06-02 NOTE — Progress Notes (Addendum)
[name], [name] ([id number]) Visit Report for 06/02/2021 Chief Complaint Document Details Patient Name: [name], [name]. Date of Service: 06/02/2021 9:00 AM Medical Record Number: [id number] Patient Account Number: [id number] Date of Birth/Sex: February 28, 1923 (87 y.o. F) Treating RN: Dolan Amen Primary Care Provider: Adrian Prows Other Clinician: Referring Provider: Adrian Prows Treating Provider/Extender: Skipper Cliche in Treatment: 2 Information Obtained from: Patient Chief Complaint Right foot ulcer Electronic Signature(s) Signed: 06/02/2021 9:23:23 AM By: Worthy Keeler PA-C Entered By: Worthy Keeler on 06/02/2021 53:29:92 EQASTMHD, [name] ([id number]) -------------------------------------------------------------------------------- HPI Details Patient Name: [name] Date of Service: 06/02/2021 9:00 AM Medical Record Number: [id number] Patient Account Number: [id number] Date of Birth/Sex: [birth year] (87 y.o. 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 Dr. Amalia Hailey the podiatrist saw her on 07/20/2017 and sent her to the wound center for possible hyperbaric oxygen therapy. past medical history of peripheral vascular disease, varicose veins, status post appendectomy, basal cell carcinoma excision from the left leg, cholecystectomy, pacemaker placement, right lower extremity angiography done by Dr. dew in March 2017 with placement of a stent. there is also note of a successful ablation of the right small saphenous vein done which was reviewed by ultrasound on 10/24/2016. the  patient had a right small saphenous vein ablation done on 10/20/2016. The patient has never been a smoker. She has been seen by Dr. Corene Cornea dew the vascular surgeon who most recently saw her on 06/15/2017 for evaluation of ongoing problems with right leg swelling. She had a lower extremity arterial duplex examination done(02/13/17) which showed patent distal right superficial femoral artery stent and above-the-knee popliteal stent without evidence of restenosis. The ABI was more than 1.3 on the right and more than 1.3 on the left. This was consistent with noncompressible arteries due to medial calcification. The right great toe pressure and PPG waveforms are within normal limits and the left great toe pressure and PPG waveforms are decreased. he recommended she continue to wear her compression stockings and continue with elevation. She is scheduled to have a noninvasive arterial study in the near future 08/16/2017 -- had a lower extremity arterial duplex examination done which showed patent distal right superficial femoral artery stent and above-the-knee popliteal stent without evidence of restenosis. The ABI was more than 1.3 on the right and more than 1.3 on the left. This was consistent with noncompressible arteries due to medial calcification. The right great toe pressure and PPG waveforms are within normal limits and the left great toe pressure and PPG waveforms are decreased. the x-ray of the right ankle has not yet been done 08/24/2017 -- had a right ankle x-ray -- IMPRESSION:1. No fracture, bone lesion or evidence of osteomyelitis. 2. Lateral soft tissue swelling with a soft tissue ulcer. she has not yet seen the vascular surgeon for review 08/31/17 on evaluation today patient's wound appears to be showing signs of improvement. She still with her appointment with vascular in order to review her results of her vascular study and then determine if any intervention would be recommended at that  time. No fevers, chills, nausea, or vomiting noted at this time. She has been tolerating the dressing changes without complication. 09/28/17 on evaluation today patient's  wound appears to show signs of good improvement in regard to the granulation tissue which is surfacing. There is still a layer of slough covering the wound and the posterior portion is still significantly deeper than the anterior nonetheless there has been some good sign of things moving towards the better. She is going to go back to Dr. dew for reevaluation to ensure her blood flow is still appropriate. That will be before her next evaluation with [place] next week. No fevers, chills, nausea, or vomiting noted at this time. Patient does have some discomfort rated to be a 3-4/10 depending on activity specifically cleansing the wound makes it worse. 10/05/2017 -- the patient was seen by Dr. Lucky Cowboy last week and noninvasive studies showed a normal right ABI with brisk triphasic waveforms consistent with no arterial insufficiency including normal digital pressures. The duplex showed a patent distal right SFA stent and the proximal SFA was also normal. He was pleased with her test and thought she should have enough of perfusion for normal wound healing. He would see her back in 6 months time. 12/21/17 on evaluation today patient appears to be doing fairly well in regard to her right lateral ankle wound. Unfortunately the main issue that she is expansion at this point is that she is having some issues with what appears to be some cellulitis in the right anterior shin. She has also been noting a little bit of uncomfortable feeling especially last night and her ankle area. I'm afraid that she made the developing a little bit of an infection. With that being said I think it is in the early stages. 12/28/17 on evaluation today patient's ankle appears to be doing excellent. She's making good progress at this point the cellulitis seems to have improved  after last week's evaluation. Overall she is having no significant discomfort which is excellent news. She does have an appointment with Dr. dew on March 29, 2018 for reevaluation in regard to the stent he placed. She seems to have excellent blood flow in the right lower extremity. 01/19/12 on evaluation today patient's wound appears to be doing very well. In fact she does not appear to require debridement at this point, there's no evidence of infection, and overall from the standpoint of the wound she seems to be doing very well. With that being said I believe that it may be time to switch to different dressing away from the [facility] Dressing she tells me she does have a lot going on her friend actually passed away yesterday and she's also having a lot of issues with her husband this obviously is weighing heavy on her as far as your thoughts and concerns today. 01/25/18 on evaluation today patient appears to be doing fairly well in regard to her right lateral malleolus. She has been tolerating the dressing changes without complication. Overall I feel like this is definitely showing signs of improvement as far as how the overall appearance of the wound is there's also evidence of epithelium start to migrate over the granulation tissue. In general I think that she is progressing nicely as far as the wound is concerned. The only concern she really has is whether or not we can switch to every other week visits in order to avoid having as many appointments as her daughters have a difficult time getting her to her appointments as well as the patient's husband to his he is not doing very well at this point. 02/22/18 on evaluation today patient's right lateral malleolus ulcer  appears to be doing great. She has been tolerating the dressing changes without complication. Overall you making excellent progress at this time. Patient is having no significant discomfort. [name], [name] ([id number]) 03/15/18  on evaluation today patient appears to be doing much more poorly in regard to her right lateral ankle ulcer at this point. Unfortunately since have last seen her her husband has passed just a few days ago is obviously weighed heavily on her her daughter also had surgery well she is with her today as usual. There does not appear to be any evidence of infection she does seem to have significant contusion/deep tissue injury to the right lateral malleolus which was not noted previous when I saw her last. It's hard to tell of exactly when this injury occurred although during the time she was spending the night in the hospital this may have been most likely. 03/22/18 on evaluation today patient appears to actually be doing very well in regard to her ulcer. She did unfortunately have a setback which was noted last week however the good news is we seem to be getting back on track and in fact the wound in the core did still have some necrotic tissue which will be addressed at this point today but in general I'm seeing signs that things are on the up and up. She is glad to hear this obviously she's been somewhat concerned that due to the how her wound digressed more recently. 03/29/18 on evaluation today patient appears to be doing fairly well in regard to her right lower extremity lateral malleolus ulcer. She unfortunately does have a new area of pressure injury over the inferior portion where the wound has opened up a little bit larger secondary to the pressure she seems to be getting. She does tell me sometimes when she sleeps at night that it actually hurts and does seem to be pushing on the area little bit more unfortunately. There does not appear to be any evidence of infection which is good news. She has been tolerating the dressing changes without complication. She also did have some bruising in the left second and third toes due to the fact that she may have bump this or injured it although she has  neuropathy so she does not feel she did move recently that may have been where this came from. Nonetheless there does not appear to be any evidence of infection at this time. 04/12/18 on evaluation today patient's wound on the right lateral ankle actually appears to be doing a little bit better with a lot of necrotic docking tissue centrally loosening up in clearing away. However she does have the beginnings of a deep tissue injury on the left lateral malleolus likely due to the fact we've been trying offload the right as much as we have. I think she may benefit from an assistive soft device to help with offloading and it looks like they're looking at one of the doughnut conditions that wraps around the lower leg to offload which I think will definitely do a good job. With that being said I think we definitely need to address this issue on the left before it becomes a wound. Patient is not having significant pain. 04/19/18 on evaluation today patient appears to be doing excellent in regard to the progress she's made with her right lateral ankle ulcer. The left ankle region which did show evidence of a deep tissue injury seems to be resolving there's little fluid noted underneath and a blister there's  nothing open at this point in time overall I feel like this is progressing nicely which is good news. She does not seem to be having significant discomfort at this point which is also good news. 04/25/18-She is here in follow up evaluation for bilateral lateral malleolar ulcers. The right lateral malleolus ulcer with pale subcutaneous tissue exposure, central area of ulcer with tendon/periosteum exposed. The left lateral malleolus ulcer now with central area of nonviable tissue, otherwise deep tissue injury. She is wearing compression wraps to the left lower extremity, she will place the right lower extremity compression wraps on when she gets home. She will be out of town over the weekend and return next  week and follow-up appointment. She completed her doxycycline this morning 05/03/18 on evaluation today patient appears to be doing very well in regard to her right lateral ankle ulcer in general. At least she's showing some signs of improvement in this regard. Unfortunately she has some additional injury to the left lateral malleolus region which appears to be new likely even over the past several days. Again this determination is based on the overall appearance. With that being said the patient is obviously frustrated about this currently. 05/10/18-She is here in follow-up evaluation for bilateral lateral malleolar ulcers. She states she has purchased offloading shoes/boots and they will arrive tomorrow. She was asked to bring them in the office at next week's appointment so her provider is aware of product being utilized. She continues to sleep on right or left side, she has been encouraged to sleep on her back. The right lateral malleolus ulcer is precariously close to peri-osteum; will order xray. The left lateral malleolus ulcer is improved. Will switch back to santyl; she will follow up next week. 05/17/18 on evaluation today patient actually appears to be doing very well in regard to her malleolus her ulcers compared to last time I saw them. She does not seem to have as much in the way of contusion at this point which is great news. With that being said she does continue to have discomfort and I do believe that she is still continuing to benefit from the offloading/pressure reducing boots that were recommended. I think this is the key to trying to get this to heal up completely. 05/24/18 on evaluation today patient actually appears to be doing worse at this point in time unfortunately compared to her last week's evaluation. She is having really no increased pain which is good news unfortunately she does have more maceration in your theme and noted surrounding the right lateral ankle the left  lateral ankle is not really is erythematous I do not see signs of the overt cellulitis on that side. Unfortunately the wounds do not seem to have shown any signs of improvement since the last evaluation. She also has significant swelling especially on the right compared to previous some of this may be due to infection however also think that she may be served better while she has these wounds by compression wrapping versus continuing to use the Juxta-Lite for the time being. Especially with the amount of drainage that she is experiencing at this point. No fevers, chills, nausea, or vomiting noted at this time. 05/31/18 on evaluation today patient appears to actually be doing better in regard to her right lateral lower extremity ulcer specifically on the malleolus region. She has been tolerating the antibiotic without complication. With that being said she still continues to have issues but a little bit of redness although nothing like  she what she was experiencing previous. She still continues to pressure to her ankle area she did get the problem on offloading boots unfortunately she will not wear them she states there too uncomfortable and she can't get in and out of the bed. Nonetheless at this point her wounds seem to be continually getting worse which is not what we want I'm getting somewhat concerned about her progress and how things are going to proceed if we do not intervene in some way shape or form. I therefore had a very lengthy conversation today about offloading yet again and even made a specific suggestion for switching her to a memory foam mattress and even gave the information for a specific one that they could look at getting if it was something that they were interested in considering. She does not want to be considered for a hospital bed air mattress although honestly insurance would not cover it that she does not have any wounds on her trunk. 06/14/18 on evaluation today both wounds  over the bilateral lateral malleolus her ulcers appear to be doing better there's no evidence of pressure injury at this point. She did get the foam mattress for her bed and this does seem to have been extremely beneficial for her in my pinion. Her daughter states that she is having difficulty getting out of bed because of how soft it is. The patient also relates this to be. Nonetheless I do feel like she's actually doing better. Unfortunately right after and around the time she was getting the mattress she also sustained a fall when she got up to go pick up the phone and ended up injuring her right elbow she has [address] sut[address] place. We are not caring for this currently although home health is going to be taking the sutures out shortly. Nonetheless this may be something that we need to evaluate going forward. It depends on how well it has or has not healed in the end. She also recently saw an orthopedic specialist for an injection in the right shoulder just before her fall unfortunately the fall seems to have worsened her pain. 06/21/18 on evaluation today patient appears to be doing about the same in regard to her lateral malleolus ulcers. Both appear to be just a little bit deeper but again we are clinging away the necrotic and dead tissue which I think is why this is progressing towards a deeper realm as opposed [name], [name]. ([id number]) to improving from my measurement standpoint in that regard. Nonetheless she has been tolerating the dressing changes she absolutely hates the memory foam mattress topper that was obtained for her nonetheless I do believe this is still doing excellent as far as taking care of excess pressure in regard to the lateral malleolus regions. She in fact has no pressure injury that I see whereas in weeks past it was week by week I was constantly seeing new pressure injuries. Overall I think it has been very beneficial for her. 07/03/18; patient arrives in my clinic  today. She has deep punched out areas over her bilateral lateral malleoli. The area on the right has some more depth. We spent a lot of time today talking about pressure relief for these areas. This started when her daughter asked for a prescription for a memory foam mattress. I have never written a prescription for a mattress and I don't think insurances would pay for that on an ordinary bed. In any case he came up that she has foam  boots that she refuses to wear. I would suggest going to these before any other offloading issues when she is in bed. They say she is meticulous about offloading this the rest of the day 07/10/18- She is seen in follow-up evaluation for bilateral, lateral malleolus ulcers. There is no improvement in the ulcers. She has purchased and is sleeping on a memory foam mattress/overlay, she has been using the offloading boots nightly over the past week. She has a follow up appointment with vascular medicine at the end of October, in my opinion this follow up should be expedited given her deterioration and suboptimal TBI results. We will order plain film xray of the left ankle as deeper structures are palpable; would consider having MRI, regardless of xray report(s). The ulcers will be treated with iodoflex/iodosorb, she is unable to safely change the dressings daily with santyl. 07/19/18 on evaluation today patient appears to be doing in general visually well in regard to her bilateral lateral malleolus ulcers. She has been tolerating the dressing changes without complication which is good news. With that being said we did have an x-ray performed on 07/12/18 which revealed a slight loosen see in the lateral portion of the distal left fibula which may represent artifact but underline lytic destruction or osteomyelitis could not be excluded. MRI was recommended. With that being said we can see about getting the patient scheduled for an MRI to further evaluate this area. In fact we have  that scheduled currently for August 20 19,019. 07/26/18 on evaluation today patient's wound on the right lateral ankle actually appears to be doing fairly well at this point in my pinion. She has made some good progress currently. With that being said unfortunately in regard to the left lateral ankle ulcer this seems to be a little bit more problematic at this time. In fact as I further evaluated the situation she actually had bone exposed which is the first time that's been the case in the bone appear to be necrotic. Currently I did review patient's note from Dr. Bunnie Domino office with [facility]. He stated that ABI was 1.26 on the right and 0.95 on the left with good waveforms. Her perfusion is stable not reduced from previous studies and her digital waveforms were pretty good particularly on the right. His conclusion upon review of the note was that there was not much she could do to improve her perfusion and he felt she was adequate for wound healing. His suggestion was that she continued to see [place] and consider a synthetic skin graft if there was no underlying infection. He plans to see her back in six months or as needed. 08/01/18 on evaluation today patient appears to be doing better in regard to her right lateral ankle ulcer. Her left lateral ankle ulcer is about the same she still has bone involvement in evidence of necrosis. There does not appear to be evidence of infection at this time On the right lateral lower extremity. I have started her on the Augmentin she picked this up and started this yesterday. This is to get her through until she sees infectious disease which is scheduled for 08/12/18. 08/06/18 on evaluation today patient appears to be doing rather well considering my discussion with patient's daughter at the end of last week. The area which was marked where she had erythema seems to be improved and this is good news. With that being said overall the patient seems  to be making good improvement when it  comes to the overall appearance of the right lateral ankle ulcer although this has been slow she at least is coming around in this regard. Unfortunately in regard to the left lateral ankle ulcer this is osteomyelitis based on the pathology report as well is bone culture. Nonetheless we are still waiting CT scan. Unfortunately the MRI we originally ordered cannot be performed as the patient is a pacemaker which I had overlooked. Nonetheless we are working on the CT scan approval and scheduling as of now. She did go to the hospital over the weekend and was placed on IV Cefzo for a couple of days. Fortunately this seems to have improved the erythema quite significantly which is good news. There does not appear to be any evidence of worsening infection at this time. She did have some bleeding after the last debridement therefore I did not perform any sharp debridement in regard to left lateral ankle at this point. Patient has been approved for a snap vac for the right lateral ankle. 08/14/18; the patient with wounds over her bilateral lateral malleoli. The area on the right actually looks quite good. Been using a snap back on this area. Healthy granulation and appears to be filling in. Unfortunately the area on the left is really problematic. 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 Also noted to have cellulitis. She saw Dr. Novella Olive of infectious disease today and was put on linezolid. We are able to verify this with her pharmacy. She is completed the Augmentin that she was already on. We've been using Iodoflex to this area 08/23/18 on evaluation today patient's wounds both actually appear to be doing better compared to my prior evaluations. Fortunately she showing signs of good improvement in regard to the overall wound status especially where were using the snap vac on the right. In  regard to left lateral malleolus the wound bed actually appears to be much cleaner than previously noted. I do not feel any phone directly probed during evaluation today and though there is tendon noted this does not appear to be necrotic it's actually fairly good as far as the overall appearance of the tendon is concerned. In general the wound bed actually appears to be doing significantly better than it was previous. Patient is currently in the care of [name] and I did review that note today. He actually has her on two weeks of linezolid and then following the patient will be on 1-2 months of Keflex. That is the plan currently. She has been on antibiotics therapy as prescribed by myself initially starting on July 30, 2018 and has been on that continuously up to this point. 08/30/18 on evaluation today patient actually appears to be doing much better in regard to her right lateral malleolus ulcer. She has been tolerating the dressing changes specifically the snap vac without complication although she did have some issues with the seal currently. Apparently there was some trouble with getting it to maintain over the past week past Sunday. Nonetheless overall the wound appears better in regard to the right lateral malleolus region. In regard to left lateral malleolus this actually show some signs of additional granulation although there still tendon noted in the base of the wound this appears to be healthy not necrotic in any way whatsoever. We are considering potentially using a snap vac for the left lateral malleolus as well the product wrap from [employer], [place], was present in  the clinic today we're going to see this patient I did have her come in with me after obtaining consent from the patient and her daughter in order to look at the wound and see if there's any recommendation one way or another as to whether or not they felt the snapback could be beneficial for the left lateral malleolus region.  But the conclusion was that it might be but that this is definitely a little bit deeper wound than what traditionally would be utilized for a snap vac. 09/06/18 on evaluation today patient actually appears to be doing excellent in my pinion in regard to both ankle ulcers. She has been tolerating the dressing changes without complication which is great news. Specifically we have been using the snap vac. In regard to the right ankle I'm not even sure that this is going to be necessary for today and following as the wound has filled in quite nicely. In regard to the left ankle I do believe [name], [name] ([id number]) that we're seeing excellent epithelialization from the edge as well as granulation in the central portion the tendon is still exposed but there's no evidence of necrotic bone and in general I feel like the patient has made excellent progress even compared to last week with just one week of the snap vac. 09/11/18; this is a patient who has wounds on her bilateral lateral malleoli. Initially both of these were deep stage IV wounds in the setting of chronic arterial insufficiency. She has been revascularized. As I understand think she been using snap vacs to both of these wounds however the area on the right became more superficial and currently she is only using it on the left. Using silver collagen on the right and silver collagen under the back on the left I believe 09/19/18 on evaluation today patient actually appears to be doing very well in regard to her lateral malleolus or ulcers bilaterally. She has been tolerating the dressing changes without complication. Fortunately there does not appear to be any evidence of infection at this time. Overall I feel like she is improving in an excellent manner and I'm very pleased with the fact that everything seems to be turning towards the better for her. This has obviously been a long road. 09/27/18 on evaluation today patient actually appears  to be doing very well in regard to her bilateral lateral malleolus ulcers. She has been tolerating the dressing changes without complication. Fortunately there does not appear to be any evidence of infection at this time which is also great news. No fevers, chills, nausea, or vomiting noted at this time. Overall I feel like she is doing excellent with the snap vac on the left malleolus. She had 40 mL of fluid collection over the past week. 10/04/18 on evaluation today patient actually appears to be doing well in regard to her bilateral lateral malleolus ulcers. She continues to tolerate the dressing changes without complication. One issue that I see is the snap vac on the left lateral malleolus which appears to have sealed off some fluid underlying this area and has not really allowed it to heal to the degree that I would like to see. For that reason I did suggest at this point we may want to pack a small piece of packing strip into this region to allow it to more effectively wick out fluid. 10/11/18 in general the patient today does not feel that she has been doing very well. She's been a little bit lethargic and  subsequently is having bodyaches as well according to what she tells me today. With that being said overall she has been concerned with the fact that something may be worsening although to be honest her wounds really have not been appearing poorly. She does have a new ulcer on her left heel unfortunately. This may be pressure related. Nonetheless it seems to me to have potentially started at least as a blister I do not see any evidence of deep tissue injury. In regard to the left ankle the snap vac still seems to be causing the ceiling off of the deeper part of the wound which is in turn trapping fluid. I'm not extremely pleased with the overall appearance as far as progress from last week to this week therefore I'm gonna discontinue the snap vac at this point. 10/18/18 patient unfortunately  this point has not been feeling well for the past several days. She was seen by Grayland Ormond her primary care provider who is a Librarian, academic at [facility]. Subsequently she states that she's been very weak and generally feeling malaise. No fevers, chills, nausea, or vomiting noted at this time. With that being said bloodwork was performed at the PCP office on the 11th of this month which showed a white blood cell count of 10.7. This was repeated today and shows a white blood cell count of 12.4. This does show signs of worsening. Coupled with the fact that she is feeling worse and that her left ankle wound is not really showing signs of improvement I feel like this is an indication that the osteomyelitis is likely exacerbating not improving. Overall I think we may also want to check her C-reactive protein and sedimentation rate. Actually did call [facility] office this afternoon while the patient was in the office here with me. Subsequently based on the findings we discussed treatment possibilities and I think that it is appropriate for [place] to go ahead and initiate treatment with doxycycline which I'm going to do. Subsequently he did agree to see about adding a CRP and sedimentation rate to her orders. If that has not already been drawn to where they can run it they will contact the patient she can come back to have that check. They are in agreement with plan as far as the patient and her daughter are concerned. Nonetheless also think we need to get in touch with Dr. Henreitta Leber office to see about getting the patient scheduled with him as soon as possible. 11/08/18 on evaluation today patient presents for follow-up concerning her bilateral foot and ankle ulcers. I did do an extensive review of her chart in epic today. Subsequently she was seen by [name] he did initiate Cefepime IV antibiotic therapy. Subsequently she had some issues with her PICC line this had to be removed because it  was coiled and then replaced. Fortunately that was now settled. Unfortunately she has continued have issues with her left heel as well as the issues that she is experiencing with her bilateral lateral malleolus regions. I do believe however both areas seem to be doing a little bit better on evaluation today which is good news. No fevers, chills, nausea, or vomiting noted at this time. She actually has an angiogram schedule with Dr. dew on this coming Monday, November 11, 2018. Subsequently the patient states that she is feeling much better especially than what she was roughly 2 weeks ago. She actually had to cancel an appointment because she was feeling so poorly. No fevers,  chills, nausea, or vomiting noted at this time. 11/15/18 on evaluation today patient actually is status post having had her angiogram with Dr. dew Monday, four days ago. 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 He had to go and work on several areas of the vasculature fortunately he was able to obtain no more than a 30% residual stenosis throughout post procedure. I reviewed this note today. I think this will definitely help with healing at this time. Fortunately there does not appear to be any signs of infection and I do feel like ratio already has a better appearance to it. 11/22/18 upon evaluation today patient actually appears to be doing very well in regard to her wounds in general. The right lateral malleolus looks excellent the heel looks better in the left lateral malleolus also appears to be doing a little better. With that being said the right second toe actually appears to be open and training we been watching this is been dry and stable but now is open. 12/03/2018 Seen today for follow-up and management of multiple bilateral lower extremity wounds. New pressure injury of the great toe which is closed at this time. Wound of the right distal second toe appears larger today with deep undermining  and a pocket of fluid present within the undermining region. Left and right malleolus is wounds are stable today with no signs and symptoms of infection.Denies any needs or concerns during exam today. 12/13/18 on evaluation today patient appears to be doing somewhat better in regard to her left heel ulcer. She also seems to be completely healed in regard to the right lateral malleolus ulcer. The left malleolus ulcer is smaller what unfortunately the wounds which are new over the first and second toes of the right foot are what are most concerning at this point especially the second. Both areas did require sharp debridement today. 12/20/18 on evaluation today patient's wound actually appears to be doing better in regard to left lateral ankle and her right lateral ankle continues to remain healed. The hill ulcer on the left is improved. She does have improvement noted as well in regard to both toe ulcers. Overall I'm very pleased in this regard. No fevers, chills, nausea, or vomiting noted at this time. 12/23/18 on evaluation today patient is seen after she had her toenails trimmed at the podiatrist office due to issues with her right great toe. There was what appeared to be dark eschar on the surface of the wound which had her in the podiatrist concerned. Nonetheless as I remember that during the last office visit I had utilize silver nitrate of this area I was much less concerned about the situation. Subsequently I was able to clean off much of this tissue without any complication today. This does not appear to show any signs of infection and actually look somewhat better Somera, [name] ([id number]) compared to last time post debridement. Her second toe on the right foot actually had callous over and there did appear still be some fluid underneath this that would require debridement today. 12/27/18 on evaluation today patient actually appears to be showing signs of improvement at all locations. Even  the left lateral ankle although this is not quite as great as the other sites. Fortunately there does not appear to be any signs of infection at this time and both of her toes on the right foot seem to be showing signs of improvement which is good news and very pleased  in this regard. 01/03/19 on evaluation today patient appears to be doing better for the most part in regard to her wounds in particular. There does not appear to be any evidence of infection at this time which is good news. Fortunately there is no sign of really worsening anywhere except for the right great toe which she does have what appears to be a bruise/deep tissue injury which is very superficial and already resolving. I'm not sure where this came from I questioned her extensively and she does not recall what may have happened with this. Other than that the patient seems to be doing well even the left lateral ankle ulcer looks good and is getting smaller. 01/10/19 on evaluation today patient appears to be doing well in regard to her left heel wound and both of her toe wounds. Overall I feel like there is definitely improvement here and I'm happy in that regard. With that being said unfortunately she is having issues with the left lateral malleolus ulcer which unfortunately still has a lot of depth to it. This is gonna be a very difficult wound for [place] to be able to truly get to heal. I may want to consider some type of skin substitute to see if this would be of benefit for her. I'll discuss this with her more the next visit most likely. This was something I thought about more at the end of the visit when I was Artie out of the room and the patient had been discharged. 01/17/19 on evaluation today patient appears to be doing very well in regard to her wounds in general. She's been making excellent progress at this time. Fortunately there's no sign of infection at this time either. No fevers, chills, nausea, or vomiting noted at this  time. The biggest issue is still her left lateral malleolus where it appears to be doing well and is getting smaller but still shows a small corner where this is deeper and goes down into what appears to be the joint space. Nonetheless this is taking much longer to heal although it still looks better in smaller than previous evaluations. 01/24/19 on evaluation today patient's wounds actually appear to be doing rather well in general overall. She did require some sharp debridement in regard to the right great toe but everything else appears to be doing excellent no debridement was even necessary. No fevers, chills, nausea, or vomiting noted at this time. 01/31/19 on evaluation today patient actually appears to be doing much better in regard to her left foot wound on the heel as well as the ankle. The right great toe appears to be a little bit worse today this had callous over and trapped a lot of fluid underneath. Fortunately there's no signs of infection at any site which is great news. 02/07/19 on evaluation today patient actually appears to be doing decently well in regard to all of her ulcers at this point. No sharp debridement was required she is a little bit of hyper granulation in regard to the left lateral ankle as well as the left heel but the hill itself is almost completely healed which is excellent news. Overall been very pleased in this regard. 02/14/19 on evaluation today patient actually appears to be doing very well in regard to her ulcers on the right first toe, left lateral malleolus, and left heel. In fact the heel is almost completely healed at this point. The patient does not show any signs of infection which is good news. Overall very  pleased with how things have progressed. 04/18/19 Telehealth Evaluation During the COVID-19 National Emergency: Verbal Consent: Obtained from patient Allergies: reviewed and the active list is current. Medication changes: patient has no current  medication changes. COVID-19 Screening: 1. Have you traveled internationally or on a cruise ship in the last 14 dayso No 2. Have you had contact with someone with or under investigation for COVID-19o No 3. Have you had a fever, cough, sore throat, or experiencing shortness of breatho No on evaluation today actually did have a visit with this patient through a telehealth encounter with her home health nurse. Subsequently it was noted that the patient actually appears to be doing okay in regard to her wounds both the right great toe as well as the left lateral malleolus have shown signs of improvement although this in your theme around the left lateral malleolus there eschar coverings for both locations. The question is whether or not they are actually close and whether or not home health needs to discharge the patient or not. Nonetheless my concern is this point obviously is that without actually seeing her and being able to evaluate this directly I cannot ensure that she is completely healed which is the question that I'm being asked. 04/22/19 on evaluation today patient presents for her first evaluation since last time I saw her which was actually February 14, 2019. I did do a telehealth visit last week in which point it was questionable whether or not she may be healed and had to bring her in today for confirmation. With that being said she does seem to be doing quite well at this point which is good news. There does not appear to be any drainage in the deed I believe her wounds may be healed. Readmission: 09/04/2019 on evaluation today patient appears to be doing unfortunately somewhat more poorly in regard to her left foot ulcer secondary to a wound that began on 08/21/2019 at least when she first noticed this. Fortunately she has not had any evidence of active infection at this time. Systemically. I also do not necessarily see any evidence of infection at the blister/wound site on the first  metatarsal head plantar aspect. This almost appears to be something that may have just rubbed inappropriately causing this to breakdown. They did not want a wait too long to come in to be seen as again she had significant issues in the past with wounds that took quite a while to heal in fact it was close to 2 years. Nonetheless this does not appear to be quite that bad but again we do need to remove some of the necrotic tissue from the surface of the wound to tell exactly the extent. She does not appear to have any significant arterial disease at this point and again her last ABIs and TBI's are recorded above in the alert section her left ABI was 1.27 with a TBI of 0.72 to the right ABI 1.08 with a TBI of 0.39. Other than this the patient has been doing quite well since I last saw her and that was in May 2020. 09/11/2019 on evaluation today patient appeared to be doing very well with regard to her plantar foot ulcer on the left. In fact this appears to be almost completely healed which is awesome. That is after just 1 week of intervention. With that being said there is no signs of active infection at this time. 09/18/2019 on evaluation today patient actually appears to be doing excellent in fact she  is completely healed based on what I am seeing at this ELLINORE, MERCED ([id number]) point. Fortunately there is no signs of active infection at this time and overall patient is very pleased to hear that this area has healed so quickly. Readmission: 05/13/2021 upon evaluation today this patient presents for reevaluation here in the clinic. This is a wound that actually we previously took care of. She had 1 on the right ankle and the left the left turned out to be be harder due to to heal but nonetheless is doing great at this point as the right that has reopened and it was noted first just several weeks ago with a scab over it and came off in just the past few days. Fortunately there does not appear to  be any obvious evidence of significant active infection at this time which is great news. No fevers, chills, nausea, vomiting, or diarrhea. 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 There does not appear to be any signs of this interfering in any way with her wound. She does have swelling we previously had compression socks for her ordered but again it does not look like she wears these on a regular basis by any means. 05/26/2021 upon evaluation today patient appears to be doing well with regard to her wound which is actually showing signs of excellent improvement. There does not appear to be any signs of active infection which is great news and overall very pleased with where things stand today. No fevers, chills, nausea, vomiting, or diarrhea. 06/02/2021 upon evaluation today patient's wound actually showing signs of excellent improvement. Fortunately there does not appear to be any signs of active infection which is great news. I think the patient is making good progress with regard to her wounds in general. Electronic Signature(s) Signed: 06/02/2021 10:17:07 AM By: Worthy Keeler PA-C Entered By: Worthy Keeler on 06/02/2021 10:17:06 Rhee, [name] ([id number]) -------------------------------------------------------------------------------- Physical Exam Details Patient Name: [name] Date of Service: 06/02/2021 9:00 AM Medical Record Number: [id number] Patient Account Number: [id number] Date of Birth/Sex: 07-Dec-1922 (87 y.o. F) Treating RN: Dolan Amen Primary Care Provider: Adrian Prows Other Clinician: Referring Provider: Adrian Prows Treating Provider/Extender: Skipper Cliche in Treatment: 2 Constitutional Well-nourished and well-hydrated in no acute distress. Respiratory normal breathing without difficulty. Psychiatric this patient is able to make decisions and demonstrates good insight into disease  process. Alert and Oriented x 3. pleasant and cooperative. Notes Upon inspection patient's wound bed actually showed signs of good granulation epithelization at this point. There was some slough noted I was able to mechanically debride this way there is no signs of the need for sharp debridement which is great news. Electronic Signature(s) Signed: 06/02/2021 2:47:02 PM By: Worthy Keeler PA-C Entered By: Worthy Keeler on 06/02/2021 14:47:02 [place], [name] ([id number]) -------------------------------------------------------------------------------- Physician Orders Details Patient Name: [name] Date of Service: 06/02/2021 9:00 AM Medical Record Number: [id number] Patient Account Number: [id number] Date of Birth/Sex: August 31, 1923 (87 y.o. F) Treating RN: Dolan Amen Primary Care Provider: Adrian Prows Other Clinician: Referring Provider: Adrian Prows Treating Provider/Extender: Skipper Cliche in Treatment: 2 Verbal / Phone Orders: No Diagnosis Coding ICD-10 Coding Code Description 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, stage 3 I89.0 Lymphedema, not elsewhere classified I87.2 Venous insufficiency (chronic) (peripheral) Z95.0 Presence of cardiac pacemaker Z79.01 Long term (current) use of anticoagulants Follow-up Appointments o Return Appointment in 1 week. o Nurse Visit as needed  Bathing/ Shower/ Hygiene o May shower with wound dressing protected with water repellent cover or cast protector. Edema Control - Lymphedema / Segmental Compressive Device / Other o Optional: One layer of unna paste to top of compression wrap (to act as an anchor). o Elevate, Exercise Daily and Avoid Standing for Long Periods of Time. o Elevate legs to the level of the heart and pump ankles as often as possible o Elevate leg(s) parallel to the floor when sitting. Wound Treatment Wound #7 - Malleolus Wound Laterality: Right, Lateral Cleanser: Soap and Water 1 x  Per Week/30 Days Discharge Instructions: Gently cleanse wound with antibacterial soap, rinse and pat dry prior to dressing wounds Primary Dressing: Hydrofera Blue Ready Transfer Foam, 2.5x2.5 (in/in) 1 x Per Week/30 Days Discharge Instructions: Apply Hydrofera Blue Ready to wound bed as directed Secondary Dressing: ABD Pad 5x9 (in/in) 1 x Per Week/30 Days Discharge Instructions: Cover with ABD pad Compression Wrap: Profore Lite LF 3 Multilayer Compression Bandaging System 1 x Per Week/30 Days Discharge Instructions: Apply 3 multi-layer wrap as prescribed. Electronic Signature(s) Signed: 06/02/2021 4:24:12 PM By: Dolan Amen RN Signed: 06/02/2021 11:03:25 PM By: Worthy Keeler PA-C Entered By: Georges Mouse, Minus Breeding on 06/02/2021 09:41:24 [place], [name] ([id number]) -------------------------------------------------------------------------------- Problem List Details Patient Name: [name], [name] Date of Service: 06/02/2021 9:00 AM Medical Record Number: [id number] Patient Account Number: [id number] Date of Birth/Sex: 08-Jul-1923 (87 y.o. F) Treating RN: Dolan Amen Primary Care Provider: Adrian Prows Other Clinician: Referring Provider: Adrian Prows Treating Provider/Extender: Skipper Cliche in Treatment: 2 Active Problems ICD-10 Encounter Code Description Active Date MDM Diagnosis 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, stage 3 05/13/2021 No Yes I89.0 Lymphedema, not elsewhere classified 05/13/2021 No Yes I87.2 Venous insufficiency (chronic) (peripheral) 05/13/2021 No Yes Z95.0 Presence of cardiac pacemaker 05/13/2021 No Yes Z79.01 Long term (current) use of anticoagulants 05/13/2021 No Yes Inactive Problems Resolved Problems Electronic Signature(s) Signed: 06/02/2021 9:23:16 AM By: Worthy Keeler PA-C Entered By: Worthy Keeler on 06/02/2021 09:23:16 Jaeger, [name]  ([id number]) -------------------------------------------------------------------------------- Progress Note Details Patient Name: [name] Date of Service: 06/02/2021 9:00 AM Medical Record Number: [id number] Patient Account Number: [id number] Date of Birth/Sex: 05-23-1923 (87 y.o. 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 Dr. Amalia Hailey the podiatrist saw her on 07/20/2017 and sent her to the wound center for possible hyperbaric oxygen therapy. past medical history of peripheral vascular disease, varicose veins, status post appendectomy, basal cell carcinoma excision from the left leg, cholecystectomy, pacemaker placement, right lower extremity angiography done by Dr. dew in March 2017 with placement of a stent. there is also note of a successful ablation of the right small saphenous vein done which was reviewed by ultrasound on 10/24/2016. the patient had a right small saphenous vein ablation done on 10/20/2016. The patient has never been a smoker. She has been seen by Dr. Corene Cornea dew the vascular surgeon who most recently saw her on 06/15/2017 for evaluation of ongoing problems with right leg swelling. She had a lower extremity arterial duplex examination done(02/13/17) which showed patent distal right superficial femoral artery stent and above-the-knee popliteal stent without evidence of restenosis.  The ABI was more than 1.3 on the right and more than 1.3 on the left. This was consistent with noncompressible arteries due to medial  calcification. The right great toe pressure and PPG waveforms are within normal limits and the left great toe pressure and PPG waveforms are decreased. he recommended she continue to wear her compression stockings and continue with elevation. She is scheduled to have a noninvasive arterial study in the near future 08/16/2017 -- had a lower extremity arterial duplex examination done which showed patent distal right superficial femoral artery stent and above-the-knee popliteal stent without evidence of restenosis. The ABI was more than 1.3 on the right and more than 1.3 on the left. This was consistent with noncompressible arteries due to medial calcification. The right great toe pressure and PPG waveforms are within normal limits and the left great toe pressure and PPG waveforms are decreased. the x-ray of the right ankle has not yet been done 08/24/2017 -- had a right ankle x-ray -- IMPRESSION:1. No fracture, bone lesion or evidence of osteomyelitis. 2. Lateral soft tissue swelling with a soft tissue ulcer. she has not yet seen the vascular surgeon for review 08/31/17 on evaluation today patient's wound appears to be showing signs of improvement. She still with her appointment with vascular in order to review her results of her vascular study and then determine if any intervention would be recommended at that time. No fevers, chills, nausea, or vomiting noted at this time. She has been tolerating the dressing changes without complication. 09/28/17 on evaluation today patient's wound appears to show signs of good improvement in regard to the granulation tissue which is surfacing. There is still a layer of slough covering the wound and the posterior portion is still significantly deeper than the anterior nonetheless there has been some good sign of things moving towards the better. She is going to go back to Dr. dew for reevaluation to ensure her blood flow is still appropriate. That will be before  her next evaluation with [place] next week. No fevers, chills, nausea, or vomiting noted at this time. Patient does have some discomfort rated to be a 3-4/10 depending on activity specifically cleansing the wound makes it worse. 10/05/2017 -- the patient was seen by Dr. Lucky Cowboy last week and noninvasive studies showed a normal right ABI with brisk triphasic waveforms consistent with no arterial insufficiency including normal digital pressures. The duplex showed a patent distal right SFA stent and the proximal SFA was also normal. He was pleased with her test and thought she should have enough of perfusion for normal wound healing. He would see her back in 6 months time. 12/21/17 on evaluation today patient appears to be doing fairly well in regard to her right lateral ankle wound. Unfortunately the main issue that she is expansion at this point is that she is having some issues with what appears to be some cellulitis in the right anterior shin. She has also been noting a little bit of uncomfortable feeling especially last night and her ankle area. I'm afraid that she made the developing a little bit of an infection. With that being said I think it is in the early stages. 12/28/17 on evaluation today patient's ankle appears to be doing excellent. She's making good progress at this point the cellulitis seems to have improved after last week's evaluation. Overall she is having no significant discomfort which is excellent news. She does have an appointment with Dr. dew on March 29, 2018 for reevaluation in regard  to the stent he placed. She seems to have excellent blood flow in the right lower extremity. 01/19/12 on evaluation today patient's wound appears to be doing very well. In fact she does not appear to require debridement at this point, there's no evidence of infection, and overall from the standpoint of the wound she seems to be doing very well. With that being said I believe that it may be time to switch to  different dressing away from the [facility] Dressing she tells me she does have a lot going on her friend actually passed away yesterday and she's also having a lot of issues with her husband this obviously is weighing heavy on her as far as your thoughts and concerns today. 01/25/18 on evaluation today patient appears to be doing fairly well in regard to her right lateral malleolus. She has been tolerating the dressing changes without complication. Overall I feel like this is definitely showing signs of improvement as far as how the overall appearance of the wound is there's also evidence of epithelium start to migrate over the granulation tissue. In general I think that she is progressing nicely as far as the wound is concerned. The only concern she really has is whether or not we can switch to every other week visits in order to avoid having as many PHILISHA, WEINEL ([id number]) appointments as her daughters have a difficult time getting her to her appointments as well as the patient's husband to his he is not doing very well at this point. 02/22/18 on evaluation today patient's right lateral malleolus ulcer appears to be doing great. She has been tolerating the dressing changes without complication. Overall you making excellent progress at this time. Patient is having no significant discomfort. 03/15/18 on evaluation today patient appears to be doing much more poorly in regard to her right lateral ankle ulcer at this point. Unfortunately since have last seen her her husband has passed just a few days ago is obviously weighed heavily on her her daughter also had surgery well she is with her today as usual. There does not appear to be any evidence of infection she does seem to have significant contusion/deep tissue injury to the right lateral malleolus which was not noted previous when I saw her last. It's hard to tell of exactly when this injury occurred although during the time she was  spending the night in the hospital this may have been most likely. 03/22/18 on evaluation today patient appears to actually be doing very well in regard to her ulcer. She did unfortunately have a setback which was noted last week however the good news is we seem to be getting back on track and in fact the wound in the core did still have some necrotic tissue which will be addressed at this point today but in general I'm seeing signs that things are on the up and up. She is glad to hear this obviously she's been somewhat concerned that due to the how her wound digressed more recently. 03/29/18 on evaluation today patient appears to be doing fairly well in regard to her right lower extremity lateral malleolus ulcer. She unfortunately does have a new area of pressure injury over the inferior portion where the wound has opened up a little bit larger secondary to the pressure she seems to be getting. She does tell me sometimes when she sleeps at night that it actually hurts and does seem to be pushing on the area little bit more unfortunately. There does  not appear to be any evidence of infection which is good news. She has been tolerating the dressing changes without complication. She also did have some bruising in the left second and third toes due to the fact that she may have bump this or injured it although she has neuropathy so she does not feel she did move recently that may have been where this came from. Nonetheless there does not appear to be any evidence of infection at this time. 04/12/18 on evaluation today patient's wound on the right lateral ankle actually appears to be doing a little bit better with a lot of necrotic docking tissue centrally loosening up in clearing away. However she does have the beginnings of a deep tissue injury on the left lateral malleolus likely due to the fact we've been trying offload the right as much as we have. I think she may benefit from an assistive soft device to  help with offloading and it looks like they're looking at one of the doughnut conditions that wraps around the lower leg to offload which I think will definitely do a good job. With that being said I think we definitely need to address this issue on the left before it becomes a wound. Patient is not having significant pain. 04/19/18 on evaluation today patient appears to be doing excellent in regard to the progress she's made with her right lateral ankle ulcer. The left ankle region which did show evidence of a deep tissue injury seems to be resolving there's little fluid noted underneath and a blister there's nothing open at this point in time overall I feel like this is progressing nicely which is good news. She does not seem to be having significant discomfort at this point which is also good news. 04/25/18-She is here in follow up evaluation for bilateral lateral malleolar ulcers. The right lateral malleolus ulcer with pale subcutaneous tissue exposure, central area of ulcer with tendon/periosteum exposed. The left lateral malleolus ulcer now with central area of nonviable tissue, otherwise deep tissue injury. She is wearing compression wraps to the left lower extremity, she will place the right lower extremity compression wraps on when she gets home. She will be out of town over the weekend and return next week and follow-up appointment. She completed her doxycycline this morning 05/03/18 on evaluation today patient appears to be doing very well in regard to her right lateral ankle ulcer in general. At least she's showing some signs of improvement in this regard. Unfortunately she has some additional injury to the left lateral malleolus region which appears to be new likely even over the past several days. Again this determination is based on the overall appearance. With that being said the patient is obviously frustrated about this currently. 05/10/18-She is here in follow-up evaluation for  bilateral lateral malleolar ulcers. She states she has purchased offloading shoes/boots and they will arrive tomorrow. She was asked to bring them in the office at next week's appointment so her provider is aware of product being utilized. She continues to sleep on right or left side, she has been encouraged to sleep on her back. The right lateral malleolus ulcer is precariously close to peri-osteum; will order xray. The left lateral malleolus ulcer is improved. Will switch back to santyl; she will follow up next week. 05/17/18 on evaluation today patient actually appears to be doing very well in regard to her malleolus her ulcers compared to last time I saw them. She does not seem to have as much  in the way of contusion at this point which is great news. With that being said she does continue to have discomfort and I do believe that she is still continuing to benefit from the offloading/pressure reducing boots that were recommended. I think this is the key to trying to get this to heal up completely. 05/24/18 on evaluation today patient actually appears to be doing worse at this point in time unfortunately compared to her last week's evaluation. She is having really no increased pain which is good news unfortunately she does have more maceration in your theme and noted surrounding the right lateral ankle the left lateral ankle is not really is erythematous I do not see signs of the overt cellulitis on that side. Unfortunately the wounds do not seem to have shown any signs of improvement since the last evaluation. She also has significant swelling especially on the right compared to previous some of this may be due to infection however also think that she may be served better while she has these wounds by compression wrapping versus continuing to use the Juxta-Lite for the time being. Especially with the amount of drainage that she is experiencing at this point. No fevers, chills, nausea, or vomiting  noted at this time. 05/31/18 on evaluation today patient appears to actually be doing better in regard to her right lateral lower extremity ulcer specifically on the malleolus region. She has been tolerating the antibiotic without complication. With that being said she still continues to have issues but a little bit of redness although nothing like she what she was experiencing previous. She still continues to pressure to her ankle area she did get the problem on offloading boots unfortunately she will not wear them she states there too uncomfortable and she can't get in and out of the bed. Nonetheless at this point her wounds seem to be continually getting worse which is not what we want I'm getting somewhat concerned about her progress and how things are going to proceed if we do not intervene in some way shape or form. I therefore had a very lengthy conversation today about offloading yet again and even made a specific suggestion for switching her to a memory foam mattress and even gave the information for a specific one that they could look at getting if it was something that they were interested in considering. She does not want to be considered for a hospital bed air mattress although honestly insurance would not cover it that she does not have any wounds on her trunk. 06/14/18 on evaluation today both wounds over the bilateral lateral malleolus her ulcers appear to be doing better there's no evidence of pressure injury at this point. She did get the foam mattress for her bed and this does seem to have been extremely beneficial for her in my pinion. Her daughter states that she is having difficulty getting out of bed because of how soft it is. The patient also relates this to be. Nonetheless I do feel like she's actually doing better. Unfortunately right after and around the time she was getting the mattress she also sustained a fall when she got up to go pick up the phone and ended up injuring her  right elbow she has 18 sutures in place. We are not caring for this currently although home health is going to be taking the sutures out shortly. Nonetheless this may be something that we need to evaluate going forward. It depends on [name], [name] ([id number]) how well  it has or has not healed in the end. She also recently saw an orthopedic specialist for an injection in the right shoulder just before her fall unfortunately the fall seems to have worsened her pain. 06/21/18 on evaluation today patient appears to be doing about the same in regard to her lateral malleolus ulcers. Both appear to be just a little bit deeper but again we are clinging away the necrotic and dead tissue which I think is why this is progressing towards a deeper realm as opposed to improving from my measurement standpoint in that regard. Nonetheless she has been tolerating the dressing changes she absolutely hates the memory foam mattress topper that was obtained for her nonetheless I do believe this is still doing excellent as far as taking care of excess pressure in regard to the lateral malleolus regions. She in fact has no pressure injury that I see whereas in weeks past it was week by week I was constantly seeing new pressure injuries. Overall I think it has been very beneficial for her. 07/03/18; patient arrives in my clinic today. She has deep punched out areas over her bilateral lateral malleoli. The area on the right has some more depth. We spent a lot of time today talking about pressure relief for these areas. This started when her daughter asked for a prescription for a memory foam mattress. I have never written a prescription for a mattress and I don't think insurances would pay for that on an ordinary bed. In any case he came up that she has foam boots that she refuses to wear. I would suggest going to these before any other offloading issues when she is in bed. They say she is meticulous about offloading this  the rest of the day 07/10/18- She is seen in follow-up evaluation for bilateral, lateral malleolus ulcers. There is no improvement in the ulcers. She has purchased and is sleeping on a memory foam mattress/overlay, she has been using the offloading boots nightly over the past week. She has a follow up appointment with vascular medicine at the end of October, in my opinion this follow up should be expedited given her deterioration and suboptimal TBI results. We will order plain film xray of the left ankle as deeper structures are palpable; would consider having MRI, regardless of xray report(s). The ulcers will be treated with iodoflex/iodosorb, she is unable to safely change the dressings daily with santyl. 07/19/18 on evaluation today patient appears to be doing in general visually well in regard to her bilateral lateral malleolus ulcers. She has been tolerating the dressing changes without complication which is good news. With that being said we did have an x-ray performed on 07/12/18 which revealed a slight loosen see in the lateral portion of the distal left fibula which may represent artifact but underline lytic destruction or osteomyelitis could not be excluded. MRI was recommended. With that being said we can see about getting the patient scheduled for an MRI to further evaluate this area. In fact we have that scheduled currently for August 20 19,019. 07/26/18 on evaluation today patient's wound on the right lateral ankle actually appears to be doing fairly well at this point in my pinion. She has made some good progress currently. With that being said unfortunately in regard to the left lateral ankle ulcer this seems to be a little bit more problematic at this time. In fact as I further evaluated the situation she actually had bone exposed which is the first time that's been  the case in the bone appear to be necrotic. Currently I did review patient's note from Dr. Bunnie Domino office with [facility]. He stated that ABI was 1.26 on the right and 0.95 on the left with good waveforms. Her perfusion is stable not reduced from previous studies and her digital waveforms were pretty good particularly on the right. His conclusion upon review of the note was that there was not much she could do to improve her perfusion and he felt she was adequate for wound healing. His suggestion was that she continued to see [place] and consider a synthetic skin graft if there was no underlying infection. He plans to see her back in six months or as needed. 08/01/18 on evaluation today patient appears to be doing better in regard to her right lateral ankle ulcer. Her left lateral ankle ulcer is about the same she still has bone involvement in evidence of necrosis. There does not appear to be evidence of infection at this time On the right lateral lower extremity. I have started her on the Augmentin she picked this up and started this yesterday. This is to get her through until she sees infectious disease which is scheduled for 08/12/18. 08/06/18 on evaluation today patient appears to be doing rather well considering my discussion with patient's daughter at the end of last week. The area which was marked where she had erythema seems to be improved and this is good news. With that being said overall the patient seems to be making good improvement when it comes to the overall appearance of the right lateral ankle ulcer although this has been slow she at least is coming around in this regard. Unfortunately in regard to the left lateral ankle ulcer this is osteomyelitis based on the pathology report as well is bone culture. Nonetheless we are still waiting CT scan. Unfortunately the MRI we originally ordered cannot be performed as the patient is a pacemaker which I had overlooked. Nonetheless we are working on the CT scan approval and scheduling as of now. She did go to the hospital over the weekend and was placed on  IV Cefzo for a couple of days. Fortunately this seems to have improved the erythema quite significantly which is good news. There does not appear to be any evidence of worsening infection at this time. She did have some bleeding after the last debridement therefore I did not perform any sharp debridement in regard to left lateral ankle at this point. Patient has been approved for a snap vac for the right lateral ankle. 08/14/18; the patient with wounds over her bilateral lateral malleoli. The area on the right actually looks quite good. Been using a snap back on this area. Healthy granulation and appears to be filling in. Unfortunately the area on the left is really problematic. 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 Also noted to have cellulitis. She saw Dr. Novella Olive of infectious disease today and was put on linezolid. We are able to verify this with her pharmacy. She is completed the Augmentin that she was already on. We've been using Iodoflex to this area 08/23/18 on evaluation today patient's wounds both actually appear to be doing better compared to my prior evaluations. Fortunately she showing signs of good improvement in regard to the overall wound status especially where were using the snap vac on the right. In regard to left lateral malleolus the wound  bed actually appears to be much cleaner than previously noted. I do not feel any phone directly probed during evaluation today and though there is tendon noted this does not appear to be necrotic it's actually fairly good as far as the overall appearance of the tendon is concerned. In general the wound bed actually appears to be doing significantly better than it was previous. Patient is currently in the care of [name] and I did review that note today. He actually has her on two weeks of linezolid and then following the patient will be on 1-2 months of Keflex.  That is the plan currently. She has been on antibiotics therapy as prescribed by myself initially starting on July 30, 2018 and has been on that continuously up to this point. 08/30/18 on evaluation today patient actually appears to be doing much better in regard to her right lateral malleolus ulcer. She has been tolerating the dressing changes specifically the snap vac without complication although she did have some issues with the seal currently. Apparently there was some trouble with getting it to maintain over the past week past Sunday. Nonetheless overall the wound appears better in regard to the right lateral malleolus region. In regard to left lateral malleolus this actually show some signs of additional granulation although there still tendon noted in the base of the wound this appears to be healthy not necrotic in any way whatsoever. We are considering potentially using a snap vac for the left lateral malleolus as well the product wrap from [employer], [place], was present in the clinic today we're going to see this patient I did have her come in with me after obtaining consent from the patient and her daughter in order to look at the wound and see if there's any recommendation one way or another as to whether or not they felt the snapback could be beneficial for the left lateral malleolus region. But [name], [name] ([id number]) the conclusion was that it might be but that this is definitely a little bit deeper wound than what traditionally would be utilized for a snap vac. 09/06/18 on evaluation today patient actually appears to be doing excellent in my pinion in regard to both ankle ulcers. She has been tolerating the dressing changes without complication which is great news. Specifically we have been using the snap vac. In regard to the right ankle I'm not even sure that this is going to be necessary for today and following as the wound has filled in quite nicely. In regard to the left ankle I  do believe that we're seeing excellent epithelialization from the edge as well as granulation in the central portion the tendon is still exposed but there's no evidence of necrotic bone and in general I feel like the patient has made excellent progress even compared to last week with just one week of the snap vac. 09/11/18; this is a patient who has wounds on her bilateral lateral malleoli. Initially both of these were deep stage IV wounds in the setting of chronic arterial insufficiency. She has been revascularized. As I understand think she been using snap vacs to both of these wounds however the area on the right became more superficial and currently she is only using it on the left. Using silver collagen on the right and silver collagen under the back on the left I believe 09/19/18 on evaluation today patient actually appears to be doing very well in regard to her lateral malleolus or ulcers bilaterally. She has been  tolerating the dressing changes without complication. Fortunately there does not appear to be any evidence of infection at this time. Overall I feel like she is improving in an excellent manner and I'm very pleased with the fact that everything seems to be turning towards the better for her. This has obviously been a long road. 09/27/18 on evaluation today patient actually appears to be doing very well in regard to her bilateral lateral malleolus ulcers. She has been tolerating the dressing changes without complication. Fortunately there does not appear to be any evidence of infection at this time which is also great news. No fevers, chills, nausea, or vomiting noted at this time. Overall I feel like she is doing excellent with the snap vac on the left malleolus. She had 40 mL of fluid collection over the past week. 10/04/18 on evaluation today patient actually appears to be doing well in regard to her bilateral lateral malleolus ulcers. She continues to tolerate the dressing changes  without complication. One issue that I see is the snap vac on the left lateral malleolus which appears to have sealed off some fluid underlying this area and has not really allowed it to heal to the degree that I would like to see. For that reason I did suggest at this point we may want to pack a small piece of packing strip into this region to allow it to more effectively wick out fluid. 10/11/18 in general the patient today does not feel that she has been doing very well. She's been a little bit lethargic and subsequently is having bodyaches as well according to what she tells me today. With that being said overall she has been concerned with the fact that something may be worsening although to be honest her wounds really have not been appearing poorly. She does have a new ulcer on her left heel unfortunately. This may be pressure related. Nonetheless it seems to me to have potentially started at least as a blister I do not see any evidence of deep tissue injury. In regard to the left ankle the snap vac still seems to be causing the ceiling off of the deeper part of the wound which is in turn trapping fluid. I'm not extremely pleased with the overall appearance as far as progress from last week to this week therefore I'm gonna discontinue the snap vac at this point. 10/18/18 patient unfortunately this point has not been feeling well for the past several days. She was seen by Grayland Ormond her primary care provider who is a Librarian, academic at [facility]. Subsequently she states that she's been very weak and generally feeling malaise. No fevers, chills, nausea, or vomiting noted at this time. With that being said bloodwork was performed at the PCP office on the 11th of this month which showed a white blood cell count of 10.7. This was repeated today and shows a white blood cell count of 12.4. This does show signs of worsening. Coupled with the fact that she is feeling worse and that her left  ankle wound is not really showing signs of improvement I feel like this is an indication that the osteomyelitis is likely exacerbating not improving. Overall I think we may also want to check her C-reactive protein and sedimentation rate. Actually did call [facility] office this afternoon while the patient was in the office here with me. Subsequently based on the findings we discussed treatment possibilities and I think that it is appropriate for [place] to go ahead and  initiate treatment with doxycycline which I'm going to do. Subsequently he did agree to see about adding a CRP and sedimentation rate to her orders. If that has not already been drawn to where they can run it they will contact the patient she can come back to have that check. They are in agreement with plan as far as the patient and her daughter are concerned. Nonetheless also think we need to get in touch with Dr. Henreitta Leber office to see about getting the patient scheduled with him as soon as possible. 11/08/18 on evaluation today patient presents for follow-up concerning her bilateral foot and ankle ulcers. I did do an extensive review of her chart in epic today. Subsequently she was seen by [name] he did initiate Cefepime IV antibiotic therapy. Subsequently she had some issues with her PICC line this had to be removed because it was coiled and then replaced. Fortunately that was now settled. Unfortunately she has continued have issues with her left heel as well as the issues that she is experiencing with her bilateral lateral malleolus regions. I do believe however both areas seem to be doing a little bit better on evaluation today which is good news. No fevers, chills, nausea, or vomiting noted at this time. She actually has an angiogram schedule with Dr. dew on this coming Monday, November 11, 2018. Subsequently the patient states that she is feeling much better especially than what she was roughly 2 weeks ago. She actually had to  cancel an appointment because she was feeling so poorly. No fevers, chills, nausea, or vomiting noted at this time. 11/15/18 on evaluation today patient actually is status post having had her angiogram with Dr. dew Monday, four days ago. 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 He had to go and work on several areas of the vasculature fortunately he was able to obtain no more than a 30% residual stenosis throughout post procedure. I reviewed this note today. I think this will definitely help with healing at this time. Fortunately there does not appear to be any signs of infection and I do feel like ratio already has a better appearance to it. 11/22/18 upon evaluation today patient actually appears to be doing very well in regard to her wounds in general. The right lateral malleolus looks excellent the heel looks better in the left lateral malleolus also appears to be doing a little better. With that being said the right second toe actually appears to be open and training we been watching this is been dry and stable but now is open. 12/03/2018 Seen today for follow-up and management of multiple bilateral lower extremity wounds. New pressure injury of the great toe which is closed at this time. Wound of the right distal second toe appears larger today with deep undermining and a pocket of fluid present within the undermining region. Left and right malleolus is wounds are stable today with no signs and symptoms of infection.Denies any needs or concerns during exam today. 12/13/18 on evaluation today patient appears to be doing somewhat better in regard to her left heel ulcer. She also seems to be completely healed in regard to the right lateral malleolus ulcer. The left malleolus ulcer is smaller what unfortunately the wounds which are new over the first and second toes of the right foot are what are most concerning at this point especially the second. Both areas did require  sharp debridement today. 12/20/18 on evaluation today patient's wound  actually appears to be doing better in regard to left lateral ankle and her right lateral ankle continues to remain healed. The hill ulcer on the left is improved. She does have improvement noted as well in regard to both toe ulcers. Overall I'm very pleased in this regard. No fevers, chills, nausea, or vomiting noted at this time. [name], [name] ([id number]) 12/23/18 on evaluation today patient is seen after she had her toenails trimmed at the podiatrist office due to issues with her right great toe. There was what appeared to be dark eschar on the surface of the wound which had her in the podiatrist concerned. Nonetheless as I remember that during the last office visit I had utilize silver nitrate of this area I was much less concerned about the situation. Subsequently I was able to clean off much of this tissue without any complication today. This does not appear to show any signs of infection and actually look somewhat better compared to last time post debridement. Her second toe on the right foot actually had callous over and there did appear still be some fluid underneath this that would require debridement today. 12/27/18 on evaluation today patient actually appears to be showing signs of improvement at all locations. Even the left lateral ankle although this is not quite as great as the other sites. Fortunately there does not appear to be any signs of infection at this time and both of her toes on the right foot seem to be showing signs of improvement which is good news and very pleased in this regard. 01/03/19 on evaluation today patient appears to be doing better for the most part in regard to her wounds in particular. There does not appear to be any evidence of infection at this time which is good news. Fortunately there is no sign of really worsening anywhere except for the right great toe which she does have what  appears to be a bruise/deep tissue injury which is very superficial and already resolving. I'm not sure where this came from I questioned her extensively and she does not recall what may have happened with this. Other than that the patient seems to be doing well even the left lateral ankle ulcer looks good and is getting smaller. 01/10/19 on evaluation today patient appears to be doing well in regard to her left heel wound and both of her toe wounds. Overall I feel like there is definitely improvement here and I'm happy in that regard. With that being said unfortunately she is having issues with the left lateral malleolus ulcer which unfortunately still has a lot of depth to it. This is gonna be a very difficult wound for [place] to be able to truly get to heal. I may want to consider some type of skin substitute to see if this would be of benefit for her. I'll discuss this with her more the next visit most likely. This was something I thought about more at the end of the visit when I was Artie out of the room and the patient had been discharged. 01/17/19 on evaluation today patient appears to be doing very well in regard to her wounds in general. She's been making excellent progress at this time. Fortunately there's no sign of infection at this time either. No fevers, chills, nausea, or vomiting noted at this time. The biggest issue is still her left lateral malleolus where it appears to be doing well and is getting smaller but still shows a small corner where this is  deeper and goes down into what appears to be the joint space. Nonetheless this is taking much longer to heal although it still looks better in smaller than previous evaluations. 01/24/19 on evaluation today patient's wounds actually appear to be doing rather well in general overall. She did require some sharp debridement in regard to the right great toe but everything else appears to be doing excellent no debridement was even necessary. No  fevers, chills, nausea, or vomiting noted at this time. 01/31/19 on evaluation today patient actually appears to be doing much better in regard to her left foot wound on the heel as well as the ankle. The right great toe appears to be a little bit worse today this had callous over and trapped a lot of fluid underneath. Fortunately there's no signs of infection at any site which is great news. 02/07/19 on evaluation today patient actually appears to be doing decently well in regard to all of her ulcers at this point. No sharp debridement was required she is a little bit of hyper granulation in regard to the left lateral ankle as well as the left heel but the hill itself is almost completely healed which is excellent news. Overall been very pleased in this regard. 02/14/19 on evaluation today patient actually appears to be doing very well in regard to her ulcers on the right first toe, left lateral malleolus, and left heel. In fact the heel is almost completely healed at this point. The patient does not show any signs of infection which is good news. Overall very pleased with how things have progressed. 04/18/19 Telehealth Evaluation During the COVID-19 National Emergency: Verbal Consent: Obtained from patient Allergies: reviewed and the active list is current. Medication changes: patient has no current medication changes. COVID-19 Screening: 1. Have you traveled internationally or on a cruise ship in the last 14 dayso No 2. Have you had contact with someone with or under investigation for COVID-19o No 3. Have you had a fever, cough, sore throat, or experiencing shortness of breatho No on evaluation today actually did have a visit with this patient through a telehealth encounter with her home health nurse. Subsequently it was noted that the patient actually appears to be doing okay in regard to her wounds both the right great toe as well as the left lateral malleolus have shown signs of improvement  although this in your theme around the left lateral malleolus there eschar coverings for both locations. The question is whether or not they are actually close and whether or not home health needs to discharge the patient or not. Nonetheless my concern is this point obviously is that without actually seeing her and being able to evaluate this directly I cannot ensure that she is completely healed which is the question that I'm being asked. 04/22/19 on evaluation today patient presents for her first evaluation since last time I saw her which was actually February 14, 2019. I did do a telehealth visit last week in which point it was questionable whether or not she may be healed and had to bring her in today for confirmation. With that being said she does seem to be doing quite well at this point which is good news. There does not appear to be any drainage in the deed I believe her wounds may be healed. Readmission: 09/04/2019 on evaluation today patient appears to be doing unfortunately somewhat more poorly in regard to her left foot ulcer secondary to a wound that began on 08/21/2019 at least  when she first noticed this. Fortunately she has not had any evidence of active infection at this time. Systemically. I also do not necessarily see any evidence of infection at the blister/wound site on the first metatarsal head plantar aspect. This almost appears to be something that may have just rubbed inappropriately causing this to breakdown. They did not want a wait too long to come in to be seen as again she had significant issues in the past with wounds that took quite a while to heal in fact it was close to 2 years. Nonetheless this does not appear to be quite that bad but again we do need to remove some of the necrotic tissue from the surface of the wound to tell exactly the extent. She does not appear to have any significant arterial disease at this point and again her last ABIs and TBI's are recorded above  in the alert section her left ABI was 1.27 with a TBI of 0.72 to the right ABI 1.08 with a TBI of 0.39. Other than this the patient has been doing quite well since I last saw her and that was in May 2020. [name], [name] ([id number]) 09/11/2019 on evaluation today patient appeared to be doing very well with regard to her plantar foot ulcer on the left. In fact this appears to be almost completely healed which is awesome. That is after just 1 week of intervention. With that being said there is no signs of active infection at this time. 09/18/2019 on evaluation today patient actually appears to be doing excellent in fact she is completely healed based on what I am seeing at this point. Fortunately there is no signs of active infection at this time and overall patient is very pleased to hear that this area has healed so quickly. Readmission: 05/13/2021 upon evaluation today this patient presents for reevaluation here in the clinic. This is a wound that actually we previously took care of. She had 1 on the right ankle and the left the left turned out to be be harder due to to heal but nonetheless is doing great at this point as the right that has reopened and it was noted first just several weeks ago with a scab over it and came off in just the past few days. Fortunately there does not appear to be any obvious evidence of significant active infection at this time which is great news. No fevers, chills, nausea, vomiting, or diarrhea. 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 There does not appear to be any signs of this interfering in any way with her wound. She does have swelling we previously had compression socks for her ordered but again it does not look like she wears these on a regular basis by any means. 05/26/2021 upon evaluation today patient appears to be doing well with regard to her wound which is actually showing signs of  excellent improvement. There does not appear to be any signs of active infection which is great news and overall very pleased with where things stand today. No fevers, chills, nausea, vomiting, or diarrhea. 06/02/2021 upon evaluation today patient's wound actually showing signs of excellent improvement. Fortunately there does not appear to be any signs of active infection which is great news. I think the patient is making good progress with regard to her wounds in general. Objective Constitutional Well-nourished and well-hydrated in no acute distress. Vitals Time Taken: 8:53 AM, Height: 62 in, Weight: 150  lbs, BMI: 27.4, Temperature: 98.2 F, Pulse: 91 bpm, Respiratory Rate: 18 breaths/min, Blood Pressure: 149/73 mmHg. Respiratory normal breathing without difficulty. Psychiatric this patient is able to make decisions and demonstrates good insight into disease process. Alert and Oriented x 3. pleasant and cooperative. General Notes: Upon inspection patient's wound bed actually showed signs of good granulation epithelization at this point. There was some slough noted I was able to mechanically debride this way there is no signs of the need for sharp debridement which is great news. Integumentary (Hair, Skin) Wound #7 status is Open. Original cause of wound was Gradually Appeared. The date acquired was: 05/12/2021. The wound has been in treatment 2 weeks. The wound is located on the Right,Lateral Malleolus. The wound measures 0.9cm length x 0.8cm width x 0.1cm depth; 0.565cm^2 area and 0.057cm^3 volume. There is Fat Layer (Subcutaneous Tissue) exposed. There is no tunneling or undermining noted. There is a medium amount of serous drainage noted. The wound margin is distinct with the outline attached to the wound base. There is small (1-33%) red granulation within the wound bed. There is a large (67-100%) amount of necrotic tissue within the wound bed including Adherent Slough. Assessment Active  Problems ICD-10 Pressure ulcer of right ankle, stage 3 Lymphedema, not elsewhere classified Venous insufficiency (chronic) (peripheral) Presence of cardiac pacemaker Long term (current) use of anticoagulants [name], [name] ([id number]) Procedures Wound #7 Pre-procedure diagnosis of Wound #7 is a Pressure Ulcer located on the Right,Lateral Malleolus . There was a Three Layer Compression Therapy Procedure with a pre-treatment ABI of 1 by Dolan Amen, RN. Post procedure Diagnosis Wound #7: Same as Pre-Procedure Plan Follow-up Appointments: Return Appointment in 1 week. Nurse Visit as needed Bathing/ Shower/ Hygiene: May shower with wound dressing protected with water repellent cover or cast protector. Edema Control - Lymphedema / Segmental Compressive Device / Other: Optional: One layer of unna paste to top of compression wrap (to act as an anchor). Elevate, Exercise Daily and Avoid Standing for Long Periods of Time. Elevate legs to the level of the heart and pump ankles as often as possible Elevate leg(s) parallel to the floor when sitting. WOUND #7: - Malleolus Wound Laterality: Right, Lateral Cleanser: Soap and Water 1 x Per Week/30 Days Discharge Instructions: Gently cleanse wound with antibacterial soap, rinse and pat dry prior to dressing wounds Primary Dressing: Hydrofera Blue Ready Transfer Foam, 2.5x2.5 (in/in) 1 x Per Week/30 Days Discharge Instructions: Apply Hydrofera Blue Ready to wound bed as directed Secondary Dressing: ABD Pad 5x9 (in/in) 1 x Per Week/30 Days Discharge Instructions: Cover with ABD pad Compression Wrap: Profore Lite LF 3 Multilayer Compression Bandaging System 1 x Per Week/30 Days Discharge Instructions: Apply 3 multi-layer wrap as prescribed. 1. Would recommend currently that we going continue with the wound care measures as before and the patient is in agreement with the plan this includes the use of the [facility] which I think is doing a  great job. 2. We can continue with ABD pad to cover. 3. We will also continue with 3 layer compression wrap. We will see patient back for reevaluation in 1 week here in the clinic. If anything worsens or changes patient will contact our office for additional recommendations. Electronic Signature(s) Signed: 06/02/2021 2:47:24 PM By: Worthy Keeler PA-C Entered By: Worthy Keeler on 06/02/2021 14:47:23 Szumski, [name] ([id number]) -------------------------------------------------------------------------------- SuperBill Details Patient Name: [name] Date of Service: 06/02/2021 Medical Record Number: [id number] Patient Account Number: [id number] Date of Birth/Sex:  1923/05/03 (87 y.o. F) Treating RN: Dolan Amen Primary Care Provider: Adrian Prows Other Clinician: Referring Provider: Adrian Prows Treating Provider/Extender: Skipper Cliche in Treatment: 2 Diagnosis Coding ICD-10 Codes Code Description [phone] Pressure ulcer of right ankle, stage 3 I89.0 Lymphedema, not elsewhere classified I87.2 Venous insufficiency (chronic) (peripheral) Z95.0 Presence of cardiac pacemaker Z79.01 Long term (current) use of anticoagulants Facility Procedures CPT4 Code: 35361443 Description: (Facility Use Only) [phone] - [place] [facility] RT LEG Modifier: Quantity: 1 Physician Procedures CPT4 Code: [id number] Description: 32671 - WC PHYS LEVEL 3 - EST PT Modifier: Quantity: 1 CPT4 Code: Description: ICD-10 Diagnosis Description 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, stage 3 I89.0 Lymphedema, not elsewhere classified I87.2 Venous insufficiency (chronic) (peripheral) 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

## 2021-06-09 ENCOUNTER — Encounter: Payer: Medicare Other | Attending: Physician Assistant | Admitting: Physician Assistant

## 2021-06-09 ENCOUNTER — Other Ambulatory Visit: Payer: Self-pay

## 2021-06-09 DIAGNOSIS — Z7901 Long term (current) use of anticoagulants: Secondary | ICD-10-CM | POA: Diagnosis not present

## 2021-06-09 DIAGNOSIS — I890 Lymphedema, not elsewhere classified: Secondary | ICD-10-CM | POA: Diagnosis not present

## 2021-06-09 DIAGNOSIS — Z85828 Personal history of other malignant neoplasm of skin: Secondary | ICD-10-CM | POA: Insufficient documentation

## 2021-06-09 DIAGNOSIS — I872 Venous insufficiency (chronic) (peripheral): Secondary | ICD-10-CM | POA: Diagnosis not present

## 2021-06-09 DIAGNOSIS — L89513 Pressure ulcer of right ankle, stage 3: Secondary | ICD-10-CM | POA: Diagnosis not present

## 2021-06-09 DIAGNOSIS — I739 Peripheral vascular disease, unspecified: Secondary | ICD-10-CM | POA: Diagnosis not present

## 2021-06-09 DIAGNOSIS — Z950 Presence of cardiac pacemaker: Secondary | ICD-10-CM | POA: Diagnosis not present

## 2021-06-09 NOTE — Progress Notes (Addendum)
[name], [name] ([id number]) Visit Report for 06/09/2021 Chief Complaint Document Details Patient Name: [name], [name]. Date of Service: 06/09/2021 2:00 PM Medical Record Number: [id number] Patient Account Number: [id number] Date of Birth/Sex: 1923-10-11 (87 y.o. F) Treating RN: Dolan Amen Primary Care Provider: Adrian Prows Other Clinician: Referring Provider: Adrian Prows Treating Provider/Extender: Skipper Cliche in Treatment: 3 Information Obtained from: Patient Chief Complaint Right foot ulcer Electronic Signature(s) Signed: 06/09/2021 2:21:33 PM By: Worthy Keeler PA-C Entered By: Worthy Keeler on 06/09/2021 14:21:32 Bundick, [name] ([id number]) -------------------------------------------------------------------------------- HPI Details Patient Name: [name] Date of Service: 06/09/2021 2:00 PM Medical Record Number: [id number] Patient Account Number: [id number] Date of Birth/Sex: 02-22-1923 (87 y.o. 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 Dr. Amalia Hailey the podiatrist saw her on 07/20/2017 and sent her to the wound center for possible hyperbaric oxygen therapy. past medical history of peripheral vascular disease, varicose veins, status post appendectomy, basal cell carcinoma excision from the left leg, cholecystectomy, pacemaker placement, right lower extremity angiography done by Dr. dew in March 2017 with placement of a stent. there is also note of a successful ablation of the right small saphenous vein done which was reviewed by ultrasound on 10/24/2016. the  patient had a right small saphenous vein ablation done on 10/20/2016. The patient has never been a smoker. She has been seen by Dr. Corene Cornea dew the vascular surgeon who most recently saw her on 06/15/2017 for evaluation of ongoing problems with right leg swelling. She had a lower extremity arterial duplex examination done(02/13/17) which showed patent distal right superficial femoral artery stent and above-the-knee popliteal stent without evidence of restenosis. The ABI was more than 1.3 on the right and more than 1.3 on the left. This was consistent with noncompressible arteries due to medial calcification. The right great toe pressure and PPG waveforms are within normal limits and the left great toe pressure and PPG waveforms are decreased. he recommended she continue to wear her compression stockings and continue with elevation. She is scheduled to have a noninvasive arterial study in the near future 08/16/2017 -- had a lower extremity arterial duplex examination done which showed patent distal right superficial femoral artery stent and above-the-knee popliteal stent without evidence of restenosis. The ABI was more than 1.3 on the right and more than 1.3 on the left. This was consistent with noncompressible arteries due to medial calcification. The right great toe pressure and PPG waveforms are within normal limits and the left great toe pressure and PPG waveforms are decreased. the x-ray of the right ankle has not yet been done 08/24/2017 -- had a right ankle x-ray -- IMPRESSION:1. No fracture, bone lesion or evidence of osteomyelitis. 2. Lateral soft tissue swelling with a soft tissue ulcer. she has not yet seen the vascular surgeon for review 08/31/17 on evaluation today patient's wound appears to be showing signs of improvement. She still with her appointment with vascular in order to review her results of her vascular study and then determine if any intervention would be recommended at that  time. No fevers, chills, nausea, or vomiting noted at this time. She has been tolerating the dressing changes without complication. 09/28/17 on evaluation today patient's  wound appears to show signs of good improvement in regard to the granulation tissue which is surfacing. There is still a layer of slough covering the wound and the posterior portion is still significantly deeper than the anterior nonetheless there has been some good sign of things moving towards the better. She is going to go back to Dr. dew for reevaluation to ensure her blood flow is still appropriate. That will be before her next evaluation with [place] next week. No fevers, chills, nausea, or vomiting noted at this time. Patient does have some discomfort rated to be a 3-4/10 depending on activity specifically cleansing the wound makes it worse. 10/05/2017 -- the patient was seen by Dr. Lucky Cowboy last week and noninvasive studies showed a normal right ABI with brisk triphasic waveforms consistent with no arterial insufficiency including normal digital pressures. The duplex showed a patent distal right SFA stent and the proximal SFA was also normal. He was pleased with her test and thought she should have enough of perfusion for normal wound healing. He would see her back in 6 months time. 12/21/17 on evaluation today patient appears to be doing fairly well in regard to her right lateral ankle wound. Unfortunately the main issue that she is expansion at this point is that she is having some issues with what appears to be some cellulitis in the right anterior shin. She has also been noting a little bit of uncomfortable feeling especially last night and her ankle area. I'm afraid that she made the developing a little bit of an infection. With that being said I think it is in the early stages. 12/28/17 on evaluation today patient's ankle appears to be doing excellent. She's making good progress at this point the cellulitis seems to have improved  after last week's evaluation. Overall she is having no significant discomfort which is excellent news. She does have an appointment with Dr. dew on March 29, 2018 for reevaluation in regard to the stent he placed. She seems to have excellent blood flow in the right lower extremity. 01/19/12 on evaluation today patient's wound appears to be doing very well. In fact she does not appear to require debridement at this point, there's no evidence of infection, and overall from the standpoint of the wound she seems to be doing very well. With that being said I believe that it may be time to switch to different dressing away from the [facility] Dressing she tells me she does have a lot going on her friend actually passed away yesterday and she's also having a lot of issues with her husband this obviously is weighing heavy on her as far as your thoughts and concerns today. 01/25/18 on evaluation today patient appears to be doing fairly well in regard to her right lateral malleolus. She has been tolerating the dressing changes without complication. Overall I feel like this is definitely showing signs of improvement as far as how the overall appearance of the wound is there's also evidence of epithelium start to migrate over the granulation tissue. In general I think that she is progressing nicely as far as the wound is concerned. The only concern she really has is whether or not we can switch to every other week visits in order to avoid having as many appointments as her daughters have a difficult time getting her to her appointments as well as the patient's husband to his he is not doing very well at this point. 02/22/18 on evaluation today patient's right lateral malleolus ulcer  appears to be doing great. She has been tolerating the dressing changes without complication. Overall you making excellent progress at this time. Patient is having no significant discomfort. [name], [name] ([id number]) 03/15/18  on evaluation today patient appears to be doing much more poorly in regard to her right lateral ankle ulcer at this point. Unfortunately since have last seen her her husband has passed just a few days ago is obviously weighed heavily on her her daughter also had surgery well she is with her today as usual. There does not appear to be any evidence of infection she does seem to have significant contusion/deep tissue injury to the right lateral malleolus which was not noted previous when I saw her last. It's hard to tell of exactly when this injury occurred although during the time she was spending the night in the hospital this may have been most likely. 03/22/18 on evaluation today patient appears to actually be doing very well in regard to her ulcer. She did unfortunately have a setback which was noted last week however the good news is we seem to be getting back on track and in fact the wound in the core did still have some necrotic tissue which will be addressed at this point today but in general I'm seeing signs that things are on the up and up. She is glad to hear this obviously she's been somewhat concerned that due to the how her wound digressed more recently. 03/29/18 on evaluation today patient appears to be doing fairly well in regard to her right lower extremity lateral malleolus ulcer. She unfortunately does have a new area of pressure injury over the inferior portion where the wound has opened up a little bit larger secondary to the pressure she seems to be getting. She does tell me sometimes when she sleeps at night that it actually hurts and does seem to be pushing on the area little bit more unfortunately. There does not appear to be any evidence of infection which is good news. She has been tolerating the dressing changes without complication. She also did have some bruising in the left second and third toes due to the fact that she may have bump this or injured it although she has  neuropathy so she does not feel she did move recently that may have been where this came from. Nonetheless there does not appear to be any evidence of infection at this time. 04/12/18 on evaluation today patient's wound on the right lateral ankle actually appears to be doing a little bit better with a lot of necrotic docking tissue centrally loosening up in clearing away. However she does have the beginnings of a deep tissue injury on the left lateral malleolus likely due to the fact we've been trying offload the right as much as we have. I think she may benefit from an assistive soft device to help with offloading and it looks like they're looking at one of the doughnut conditions that wraps around the lower leg to offload which I think will definitely do a good job. With that being said I think we definitely need to address this issue on the left before it becomes a wound. Patient is not having significant pain. 04/19/18 on evaluation today patient appears to be doing excellent in regard to the progress she's made with her right lateral ankle ulcer. The left ankle region which did show evidence of a deep tissue injury seems to be resolving there's little fluid noted underneath and a blister there's  nothing open at this point in time overall I feel like this is progressing nicely which is good news. She does not seem to be having significant discomfort at this point which is also good news. 04/25/18-She is here in follow up evaluation for bilateral lateral malleolar ulcers. The right lateral malleolus ulcer with pale subcutaneous tissue exposure, central area of ulcer with tendon/periosteum exposed. The left lateral malleolus ulcer now with central area of nonviable tissue, otherwise deep tissue injury. She is wearing compression wraps to the left lower extremity, she will place the right lower extremity compression wraps on when she gets home. She will be out of town over the weekend and return next  week and follow-up appointment. She completed her doxycycline this morning 05/03/18 on evaluation today patient appears to be doing very well in regard to her right lateral ankle ulcer in general. At least she's showing some signs of improvement in this regard. Unfortunately she has some additional injury to the left lateral malleolus region which appears to be new likely even over the past several days. Again this determination is based on the overall appearance. With that being said the patient is obviously frustrated about this currently. 05/10/18-She is here in follow-up evaluation for bilateral lateral malleolar ulcers. She states she has purchased offloading shoes/boots and they will arrive tomorrow. She was asked to bring them in the office at next week's appointment so her provider is aware of product being utilized. She continues to sleep on right or left side, she has been encouraged to sleep on her back. The right lateral malleolus ulcer is precariously close to peri-osteum; will order xray. The left lateral malleolus ulcer is improved. Will switch back to santyl; she will follow up next week. 05/17/18 on evaluation today patient actually appears to be doing very well in regard to her malleolus her ulcers compared to last time I saw them. She does not seem to have as much in the way of contusion at this point which is great news. With that being said she does continue to have discomfort and I do believe that she is still continuing to benefit from the offloading/pressure reducing boots that were recommended. I think this is the key to trying to get this to heal up completely. 05/24/18 on evaluation today patient actually appears to be doing worse at this point in time unfortunately compared to her last week's evaluation. She is having really no increased pain which is good news unfortunately she does have more maceration in your theme and noted surrounding the right lateral ankle the left  lateral ankle is not really is erythematous I do not see signs of the overt cellulitis on that side. Unfortunately the wounds do not seem to have shown any signs of improvement since the last evaluation. She also has significant swelling especially on the right compared to previous some of this may be due to infection however also think that she may be served better while she has these wounds by compression wrapping versus continuing to use the Juxta-Lite for the time being. Especially with the amount of drainage that she is experiencing at this point. No fevers, chills, nausea, or vomiting noted at this time. 05/31/18 on evaluation today patient appears to actually be doing better in regard to her right lateral lower extremity ulcer specifically on the malleolus region. She has been tolerating the antibiotic without complication. With that being said she still continues to have issues but a little bit of redness although nothing like  she what she was experiencing previous. She still continues to pressure to her ankle area she did get the problem on offloading boots unfortunately she will not wear them she states there too uncomfortable and she can't get in and out of the bed. Nonetheless at this point her wounds seem to be continually getting worse which is not what we want I'm getting somewhat concerned about her progress and how things are going to proceed if we do not intervene in some way shape or form. I therefore had a very lengthy conversation today about offloading yet again and even made a specific suggestion for switching her to a memory foam mattress and even gave the information for a specific one that they could look at getting if it was something that they were interested in considering. She does not want to be considered for a hospital bed air mattress although honestly insurance would not cover it that she does not have any wounds on her trunk. 06/14/18 on evaluation today both wounds  over the bilateral lateral malleolus her ulcers appear to be doing better there's no evidence of pressure injury at this point. She did get the foam mattress for her bed and this does seem to have been extremely beneficial for her in my pinion. Her daughter states that she is having difficulty getting out of bed because of how soft it is. The patient also relates this to be. Nonetheless I do feel like she's actually doing better. Unfortunately right after and around the time she was getting the mattress she also sustained a fall when she got up to go pick up the phone and ended up injuring her right elbow she has [address] sut[address] place. We are not caring for this currently although home health is going to be taking the sutures out shortly. Nonetheless this may be something that we need to evaluate going forward. It depends on how well it has or has not healed in the end. She also recently saw an orthopedic specialist for an injection in the right shoulder just before her fall unfortunately the fall seems to have worsened her pain. 06/21/18 on evaluation today patient appears to be doing about the same in regard to her lateral malleolus ulcers. Both appear to be just a little bit deeper but again we are clinging away the necrotic and dead tissue which I think is why this is progressing towards a deeper realm as opposed [name], [name]. ([id number]) to improving from my measurement standpoint in that regard. Nonetheless she has been tolerating the dressing changes she absolutely hates the memory foam mattress topper that was obtained for her nonetheless I do believe this is still doing excellent as far as taking care of excess pressure in regard to the lateral malleolus regions. She in fact has no pressure injury that I see whereas in weeks past it was week by week I was constantly seeing new pressure injuries. Overall I think it has been very beneficial for her. 07/03/18; patient arrives in my clinic  today. She has deep punched out areas over her bilateral lateral malleoli. The area on the right has some more depth. We spent a lot of time today talking about pressure relief for these areas. This started when her daughter asked for a prescription for a memory foam mattress. I have never written a prescription for a mattress and I don't think insurances would pay for that on an ordinary bed. In any case he came up that she has foam  boots that she refuses to wear. I would suggest going to these before any other offloading issues when she is in bed. They say she is meticulous about offloading this the rest of the day 07/10/18- She is seen in follow-up evaluation for bilateral, lateral malleolus ulcers. There is no improvement in the ulcers. She has purchased and is sleeping on a memory foam mattress/overlay, she has been using the offloading boots nightly over the past week. She has a follow up appointment with vascular medicine at the end of October, in my opinion this follow up should be expedited given her deterioration and suboptimal TBI results. We will order plain film xray of the left ankle as deeper structures are palpable; would consider having MRI, regardless of xray report(s). The ulcers will be treated with iodoflex/iodosorb, she is unable to safely change the dressings daily with santyl. 07/19/18 on evaluation today patient appears to be doing in general visually well in regard to her bilateral lateral malleolus ulcers. She has been tolerating the dressing changes without complication which is good news. With that being said we did have an x-ray performed on 07/12/18 which revealed a slight loosen see in the lateral portion of the distal left fibula which may represent artifact but underline lytic destruction or osteomyelitis could not be excluded. MRI was recommended. With that being said we can see about getting the patient scheduled for an MRI to further evaluate this area. In fact we have  that scheduled currently for August 20 19,019. 07/26/18 on evaluation today patient's wound on the right lateral ankle actually appears to be doing fairly well at this point in my pinion. She has made some good progress currently. With that being said unfortunately in regard to the left lateral ankle ulcer this seems to be a little bit more problematic at this time. In fact as I further evaluated the situation she actually had bone exposed which is the first time that's been the case in the bone appear to be necrotic. Currently I did review patient's note from Dr. Bunnie Domino office with [facility]. He stated that ABI was 1.26 on the right and 0.95 on the left with good waveforms. Her perfusion is stable not reduced from previous studies and her digital waveforms were pretty good particularly on the right. His conclusion upon review of the note was that there was not much she could do to improve her perfusion and he felt she was adequate for wound healing. His suggestion was that she continued to see [place] and consider a synthetic skin graft if there was no underlying infection. He plans to see her back in six months or as needed. 08/01/18 on evaluation today patient appears to be doing better in regard to her right lateral ankle ulcer. Her left lateral ankle ulcer is about the same she still has bone involvement in evidence of necrosis. There does not appear to be evidence of infection at this time On the right lateral lower extremity. I have started her on the Augmentin she picked this up and started this yesterday. This is to get her through until she sees infectious disease which is scheduled for 08/12/18. 08/06/18 on evaluation today patient appears to be doing rather well considering my discussion with patient's daughter at the end of last week. The area which was marked where she had erythema seems to be improved and this is good news. With that being said overall the patient seems  to be making good improvement when it  comes to the overall appearance of the right lateral ankle ulcer although this has been slow she at least is coming around in this regard. Unfortunately in regard to the left lateral ankle ulcer this is osteomyelitis based on the pathology report as well is bone culture. Nonetheless we are still waiting CT scan. Unfortunately the MRI we originally ordered cannot be performed as the patient is a pacemaker which I had overlooked. Nonetheless we are working on the CT scan approval and scheduling as of now. She did go to the hospital over the weekend and was placed on IV Cefzo for a couple of days. Fortunately this seems to have improved the erythema quite significantly which is good news. There does not appear to be any evidence of worsening infection at this time. She did have some bleeding after the last debridement therefore I did not perform any sharp debridement in regard to left lateral ankle at this point. Patient has been approved for a snap vac for the right lateral ankle. 08/14/18; the patient with wounds over her bilateral lateral malleoli. The area on the right actually looks quite good. Been using a snap back on this area. Healthy granulation and appears to be filling in. Unfortunately the area on the left is really problematic. 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 Also noted to have cellulitis. She saw Dr. Novella Olive of infectious disease today and was put on linezolid. We are able to verify this with her pharmacy. She is completed the Augmentin that she was already on. We've been using Iodoflex to this area 08/23/18 on evaluation today patient's wounds both actually appear to be doing better compared to my prior evaluations. Fortunately she showing signs of good improvement in regard to the overall wound status especially where were using the snap vac on the right. In  regard to left lateral malleolus the wound bed actually appears to be much cleaner than previously noted. I do not feel any phone directly probed during evaluation today and though there is tendon noted this does not appear to be necrotic it's actually fairly good as far as the overall appearance of the tendon is concerned. In general the wound bed actually appears to be doing significantly better than it was previous. Patient is currently in the care of [name] and I did review that note today. He actually has her on two weeks of linezolid and then following the patient will be on 1-2 months of Keflex. That is the plan currently. She has been on antibiotics therapy as prescribed by myself initially starting on July 30, 2018 and has been on that continuously up to this point. 08/30/18 on evaluation today patient actually appears to be doing much better in regard to her right lateral malleolus ulcer. She has been tolerating the dressing changes specifically the snap vac without complication although she did have some issues with the seal currently. Apparently there was some trouble with getting it to maintain over the past week past Sunday. Nonetheless overall the wound appears better in regard to the right lateral malleolus region. In regard to left lateral malleolus this actually show some signs of additional granulation although there still tendon noted in the base of the wound this appears to be healthy not necrotic in any way whatsoever. We are considering potentially using a snap vac for the left lateral malleolus as well the product wrap from [employer], [place], was present in  the clinic today we're going to see this patient I did have her come in with me after obtaining consent from the patient and her daughter in order to look at the wound and see if there's any recommendation one way or another as to whether or not they felt the snapback could be beneficial for the left lateral malleolus region.  But the conclusion was that it might be but that this is definitely a little bit deeper wound than what traditionally would be utilized for a snap vac. 09/06/18 on evaluation today patient actually appears to be doing excellent in my pinion in regard to both ankle ulcers. She has been tolerating the dressing changes without complication which is great news. Specifically we have been using the snap vac. In regard to the right ankle I'm not even sure that this is going to be necessary for today and following as the wound has filled in quite nicely. In regard to the left ankle I do believe [name], [name] ([id number]) that we're seeing excellent epithelialization from the edge as well as granulation in the central portion the tendon is still exposed but there's no evidence of necrotic bone and in general I feel like the patient has made excellent progress even compared to last week with just one week of the snap vac. 09/11/18; this is a patient who has wounds on her bilateral lateral malleoli. Initially both of these were deep stage IV wounds in the setting of chronic arterial insufficiency. She has been revascularized. As I understand think she been using snap vacs to both of these wounds however the area on the right became more superficial and currently she is only using it on the left. Using silver collagen on the right and silver collagen under the back on the left I believe 09/19/18 on evaluation today patient actually appears to be doing very well in regard to her lateral malleolus or ulcers bilaterally. She has been tolerating the dressing changes without complication. Fortunately there does not appear to be any evidence of infection at this time. Overall I feel like she is improving in an excellent manner and I'm very pleased with the fact that everything seems to be turning towards the better for her. This has obviously been a long road. 09/27/18 on evaluation today patient actually appears  to be doing very well in regard to her bilateral lateral malleolus ulcers. She has been tolerating the dressing changes without complication. Fortunately there does not appear to be any evidence of infection at this time which is also great news. No fevers, chills, nausea, or vomiting noted at this time. Overall I feel like she is doing excellent with the snap vac on the left malleolus. She had 40 mL of fluid collection over the past week. 10/04/18 on evaluation today patient actually appears to be doing well in regard to her bilateral lateral malleolus ulcers. She continues to tolerate the dressing changes without complication. One issue that I see is the snap vac on the left lateral malleolus which appears to have sealed off some fluid underlying this area and has not really allowed it to heal to the degree that I would like to see. For that reason I did suggest at this point we may want to pack a small piece of packing strip into this region to allow it to more effectively wick out fluid. 10/11/18 in general the patient today does not feel that she has been doing very well. She's been a little bit lethargic and  subsequently is having bodyaches as well according to what she tells me today. With that being said overall she has been concerned with the fact that something may be worsening although to be honest her wounds really have not been appearing poorly. She does have a new ulcer on her left heel unfortunately. This may be pressure related. Nonetheless it seems to me to have potentially started at least as a blister I do not see any evidence of deep tissue injury. In regard to the left ankle the snap vac still seems to be causing the ceiling off of the deeper part of the wound which is in turn trapping fluid. I'm not extremely pleased with the overall appearance as far as progress from last week to this week therefore I'm gonna discontinue the snap vac at this point. 10/18/18 patient unfortunately  this point has not been feeling well for the past several days. She was seen by Grayland Ormond her primary care provider who is a Librarian, academic at [facility]. Subsequently she states that she's been very weak and generally feeling malaise. No fevers, chills, nausea, or vomiting noted at this time. With that being said bloodwork was performed at the PCP office on the 11th of this month which showed a white blood cell count of 10.7. This was repeated today and shows a white blood cell count of 12.4. This does show signs of worsening. Coupled with the fact that she is feeling worse and that her left ankle wound is not really showing signs of improvement I feel like this is an indication that the osteomyelitis is likely exacerbating not improving. Overall I think we may also want to check her C-reactive protein and sedimentation rate. Actually did call [facility] office this afternoon while the patient was in the office here with me. Subsequently based on the findings we discussed treatment possibilities and I think that it is appropriate for [place] to go ahead and initiate treatment with doxycycline which I'm going to do. Subsequently he did agree to see about adding a CRP and sedimentation rate to her orders. If that has not already been drawn to where they can run it they will contact the patient she can come back to have that check. They are in agreement with plan as far as the patient and her daughter are concerned. Nonetheless also think we need to get in touch with Dr. Henreitta Leber office to see about getting the patient scheduled with him as soon as possible. 11/08/18 on evaluation today patient presents for follow-up concerning her bilateral foot and ankle ulcers. I did do an extensive review of her chart in epic today. Subsequently she was seen by [name] he did initiate Cefepime IV antibiotic therapy. Subsequently she had some issues with her PICC line this had to be removed because it  was coiled and then replaced. Fortunately that was now settled. Unfortunately she has continued have issues with her left heel as well as the issues that she is experiencing with her bilateral lateral malleolus regions. I do believe however both areas seem to be doing a little bit better on evaluation today which is good news. No fevers, chills, nausea, or vomiting noted at this time. She actually has an angiogram schedule with Dr. dew on this coming Monday, November 11, 2018. Subsequently the patient states that she is feeling much better especially than what she was roughly 2 weeks ago. She actually had to cancel an appointment because she was feeling so poorly. No fevers,  chills, nausea, or vomiting noted at this time. 11/15/18 on evaluation today patient actually is status post having had her angiogram with Dr. dew Monday, four days ago. 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 He had to go and work on several areas of the vasculature fortunately he was able to obtain no more than a 30% residual stenosis throughout post procedure. I reviewed this note today. I think this will definitely help with healing at this time. Fortunately there does not appear to be any signs of infection and I do feel like ratio already has a better appearance to it. 11/22/18 upon evaluation today patient actually appears to be doing very well in regard to her wounds in general. The right lateral malleolus looks excellent the heel looks better in the left lateral malleolus also appears to be doing a little better. With that being said the right second toe actually appears to be open and training we been watching this is been dry and stable but now is open. 12/03/2018 Seen today for follow-up and management of multiple bilateral lower extremity wounds. New pressure injury of the great toe which is closed at this time. Wound of the right distal second toe appears larger today with deep undermining  and a pocket of fluid present within the undermining region. Left and right malleolus is wounds are stable today with no signs and symptoms of infection.Denies any needs or concerns during exam today. 12/13/18 on evaluation today patient appears to be doing somewhat better in regard to her left heel ulcer. She also seems to be completely healed in regard to the right lateral malleolus ulcer. The left malleolus ulcer is smaller what unfortunately the wounds which are new over the first and second toes of the right foot are what are most concerning at this point especially the second. Both areas did require sharp debridement today. 12/20/18 on evaluation today patient's wound actually appears to be doing better in regard to left lateral ankle and her right lateral ankle continues to remain healed. The hill ulcer on the left is improved. She does have improvement noted as well in regard to both toe ulcers. Overall I'm very pleased in this regard. No fevers, chills, nausea, or vomiting noted at this time. 12/23/18 on evaluation today patient is seen after she had her toenails trimmed at the podiatrist office due to issues with her right great toe. There was what appeared to be dark eschar on the surface of the wound which had her in the podiatrist concerned. Nonetheless as I remember that during the last office visit I had utilize silver nitrate of this area I was much less concerned about the situation. Subsequently I was able to clean off much of this tissue without any complication today. This does not appear to show any signs of infection and actually look somewhat better Somera, [name] ([id number]) compared to last time post debridement. Her second toe on the right foot actually had callous over and there did appear still be some fluid underneath this that would require debridement today. 12/27/18 on evaluation today patient actually appears to be showing signs of improvement at all locations. Even  the left lateral ankle although this is not quite as great as the other sites. Fortunately there does not appear to be any signs of infection at this time and both of her toes on the right foot seem to be showing signs of improvement which is good news and very pleased  in this regard. 01/03/19 on evaluation today patient appears to be doing better for the most part in regard to her wounds in particular. There does not appear to be any evidence of infection at this time which is good news. Fortunately there is no sign of really worsening anywhere except for the right great toe which she does have what appears to be a bruise/deep tissue injury which is very superficial and already resolving. I'm not sure where this came from I questioned her extensively and she does not recall what may have happened with this. Other than that the patient seems to be doing well even the left lateral ankle ulcer looks good and is getting smaller. 01/10/19 on evaluation today patient appears to be doing well in regard to her left heel wound and both of her toe wounds. Overall I feel like there is definitely improvement here and I'm happy in that regard. With that being said unfortunately she is having issues with the left lateral malleolus ulcer which unfortunately still has a lot of depth to it. This is gonna be a very difficult wound for [place] to be able to truly get to heal. I may want to consider some type of skin substitute to see if this would be of benefit for her. I'll discuss this with her more the next visit most likely. This was something I thought about more at the end of the visit when I was Artie out of the room and the patient had been discharged. 01/17/19 on evaluation today patient appears to be doing very well in regard to her wounds in general. She's been making excellent progress at this time. Fortunately there's no sign of infection at this time either. No fevers, chills, nausea, or vomiting noted at this  time. The biggest issue is still her left lateral malleolus where it appears to be doing well and is getting smaller but still shows a small corner where this is deeper and goes down into what appears to be the joint space. Nonetheless this is taking much longer to heal although it still looks better in smaller than previous evaluations. 01/24/19 on evaluation today patient's wounds actually appear to be doing rather well in general overall. She did require some sharp debridement in regard to the right great toe but everything else appears to be doing excellent no debridement was even necessary. No fevers, chills, nausea, or vomiting noted at this time. 01/31/19 on evaluation today patient actually appears to be doing much better in regard to her left foot wound on the heel as well as the ankle. The right great toe appears to be a little bit worse today this had callous over and trapped a lot of fluid underneath. Fortunately there's no signs of infection at any site which is great news. 02/07/19 on evaluation today patient actually appears to be doing decently well in regard to all of her ulcers at this point. No sharp debridement was required she is a little bit of hyper granulation in regard to the left lateral ankle as well as the left heel but the hill itself is almost completely healed which is excellent news. Overall been very pleased in this regard. 02/14/19 on evaluation today patient actually appears to be doing very well in regard to her ulcers on the right first toe, left lateral malleolus, and left heel. In fact the heel is almost completely healed at this point. The patient does not show any signs of infection which is good news. Overall very  pleased with how things have progressed. 04/18/19 Telehealth Evaluation During the COVID-19 National Emergency: Verbal Consent: Obtained from patient Allergies: reviewed and the active list is current. Medication changes: patient has no current  medication changes. COVID-19 Screening: 1. Have you traveled internationally or on a cruise ship in the last 14 dayso No 2. Have you had contact with someone with or under investigation for COVID-19o No 3. Have you had a fever, cough, sore throat, or experiencing shortness of breatho No on evaluation today actually did have a visit with this patient through a telehealth encounter with her home health nurse. Subsequently it was noted that the patient actually appears to be doing okay in regard to her wounds both the right great toe as well as the left lateral malleolus have shown signs of improvement although this in your theme around the left lateral malleolus there eschar coverings for both locations. The question is whether or not they are actually close and whether or not home health needs to discharge the patient or not. Nonetheless my concern is this point obviously is that without actually seeing her and being able to evaluate this directly I cannot ensure that she is completely healed which is the question that I'm being asked. 04/22/19 on evaluation today patient presents for her first evaluation since last time I saw her which was actually February 14, 2019. I did do a telehealth visit last week in which point it was questionable whether or not she may be healed and had to bring her in today for confirmation. With that being said she does seem to be doing quite well at this point which is good news. There does not appear to be any drainage in the deed I believe her wounds may be healed. Readmission: 09/04/2019 on evaluation today patient appears to be doing unfortunately somewhat more poorly in regard to her left foot ulcer secondary to a wound that began on 08/21/2019 at least when she first noticed this. Fortunately she has not had any evidence of active infection at this time. Systemically. I also do not necessarily see any evidence of infection at the blister/wound site on the first  metatarsal head plantar aspect. This almost appears to be something that may have just rubbed inappropriately causing this to breakdown. They did not want a wait too long to come in to be seen as again she had significant issues in the past with wounds that took quite a while to heal in fact it was close to 2 years. Nonetheless this does not appear to be quite that bad but again we do need to remove some of the necrotic tissue from the surface of the wound to tell exactly the extent. She does not appear to have any significant arterial disease at this point and again her last ABIs and TBI's are recorded above in the alert section her left ABI was 1.27 with a TBI of 0.72 to the right ABI 1.08 with a TBI of 0.39. Other than this the patient has been doing quite well since I last saw her and that was in May 2020. 09/11/2019 on evaluation today patient appeared to be doing very well with regard to her plantar foot ulcer on the left. In fact this appears to be almost completely healed which is awesome. That is after just 1 week of intervention. With that being said there is no signs of active infection at this time. 09/18/2019 on evaluation today patient actually appears to be doing excellent in fact she  is completely healed based on what I am seeing at this [name], [name] ([id number]) point. Fortunately there is no signs of active infection at this time and overall patient is very pleased to hear that this area has healed so quickly. Readmission: 05/13/2021 upon evaluation today this patient presents for reevaluation here in the clinic. This is a wound that actually we previously took care of. She had 1 on the right ankle and the left the left turned out to be be harder due to to heal but nonetheless is doing great at this point as the right that has reopened and it was noted first just several weeks ago with a scab over it and came off in just the past few days. Fortunately there does not appear to  be any obvious evidence of significant active infection at this time which is great news. No fevers, chills, nausea, vomiting, or diarrhea. 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 There does not appear to be any signs of this interfering in any way with her wound. She does have swelling we previously had compression socks for her ordered but again it does not look like she wears these on a regular basis by any means. 05/26/2021 upon evaluation today patient appears to be doing well with regard to her wound which is actually showing signs of excellent improvement. There does not appear to be any signs of active infection which is great news and overall very pleased with where things stand today. No fevers, chills, nausea, vomiting, or diarrhea. 06/02/2021 upon evaluation today patient's wound actually showing signs of excellent improvement. Fortunately there does not appear to be any signs of active infection which is great news. I think the patient is making good progress with regard to her wounds in general. 06/09/2021 upon evaluation today patient appears to be doing excellent in regard to her wounds currently. Fortunately there does not appear to be any signs of active infection which is great news. No fevers, chills, nausea, vomiting, or diarrhea. Overall extremely pleased with where things stand today. I think the patient is making excellent progress. Electronic Signature(s) Signed: 06/09/2021 2:44:09 PM By: Worthy Keeler PA-C Entered By: Worthy Keeler on 06/09/2021 14:44:09 Mcclean, [name] ([id number]) -------------------------------------------------------------------------------- Physical Exam Details Patient Name: [name] Date of Service: 06/09/2021 2:00 PM Medical Record Number: [id number] Patient Account Number: [id number] Date of Birth/Sex: 12-10-1922 (87 y.o. F) Treating RN: Dolan Amen Primary Care Provider:  Adrian Prows Other Clinician: Referring Provider: Adrian Prows Treating Provider/Extender: Skipper Cliche in Treatment: 3 Constitutional Well-nourished and well-hydrated in no acute distress. Respiratory normal breathing without difficulty. Psychiatric this patient is able to make decisions and demonstrates good insight into disease process. Alert and Oriented x 3. pleasant and cooperative. Notes Patient's wound bed actually showed signs of good granulation and epithelization at this point. There does not appear to be any signs of infection which is great news and overall I am extremely pleased with where things stand today. Electronic Signature(s) Signed: 06/09/2021 2:44:45 PM By: Worthy Keeler PA-C Entered By: Worthy Keeler on 06/09/2021 14:44:45 [name], [name] ([id number]) -------------------------------------------------------------------------------- Physician Orders Details Patient Name: [name] Date of Service: 06/09/2021 2:00 PM Medical Record Number: [id number] Patient Account Number: [id number] Date of Birth/Sex: [birth year] (87 y.o. F) Treating RN: Donnamarie Poag Primary Care Provider: Adrian Prows Other Clinician: Referring Provider: Adrian Prows Treating Provider/Extender: Skipper Cliche in Treatment: 3 Verbal /  Phone Orders: No Diagnosis Coding ICD-10 Coding Code Description 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, stage 3 I89.0 Lymphedema, not elsewhere classified I87.2 Venous insufficiency (chronic) (peripheral) Z95.0 Presence of cardiac pacemaker Z79.01 Long term (current) use of anticoagulants Follow-up Appointments o Return Appointment in 1 week. o Nurse Visit as needed Bathing/ Shower/ Hygiene o May shower with wound dressing protected with water repellent cover or cast protector. Edema Control - Lymphedema / Segmental Compressive Device / Other o Optional: One layer of unna paste to top of compression wrap (to  act as an anchor). o Elevate, Exercise Daily and Avoid Standing for Long Periods of Time. o Elevate legs to the level of the heart and pump ankles as often as possible o Elevate leg(s) parallel to the floor when sitting. Wound Treatment Wound #7 - Malleolus Wound Laterality: Right, Lateral Cleanser: Soap and Water 1 x Per Week/30 Days Discharge Instructions: Gently cleanse wound with antibacterial soap, rinse and pat dry prior to dressing wounds Primary Dressing: Hydrofera Blue Ready Transfer Foam, 2.5x2.5 (in/in) 1 x Per Week/30 Days Discharge Instructions: Apply Hydrofera Blue Ready to wound bed as directed Secondary Dressing: ABD Pad 5x9 (in/in) 1 x Per Week/30 Days Discharge Instructions: Cover with ABD pad Compression Wrap: Profore Lite LF 3 Multilayer Compression Bandaging System 1 x Per Week/30 Days Discharge Instructions: Apply 3 multi-layer wrap as prescribed. Electronic Signature(s) Signed: 06/09/2021 5:08:48 PM By: Worthy Keeler PA-C Signed: 06/10/2021 3:14:45 PM By: Donnamarie Poag Entered By: Donnamarie Poag on 06/09/2021 14:36:33 Mendibles, [name] ([id number]) -------------------------------------------------------------------------------- Problem List Details Patient Name: RAYNETTA, OSTERLOH Date of Service: 06/09/2021 2:00 PM Medical Record Number: [id number] Patient Account Number: [id number] Date of Birth/Sex: 26-Jul-1923 (87 y.o. F) Treating RN: Dolan Amen Primary Care Provider: Adrian Prows Other Clinician: Referring Provider: Adrian Prows Treating Provider/Extender: Skipper Cliche in Treatment: 3 Active Problems ICD-10 Encounter Code Description Active Date MDM Diagnosis 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, stage 3 05/13/2021 No Yes I89.0 Lymphedema, not elsewhere classified 05/13/2021 No Yes I87.2 Venous insufficiency (chronic) (peripheral) 05/13/2021 No Yes Z95.0 Presence of cardiac pacemaker 05/13/2021 No Yes Z79.01 Long term (current) use of  anticoagulants 05/13/2021 No Yes Inactive Problems Resolved Problems Electronic Signature(s) Signed: 06/09/2021 2:21:26 PM By: Worthy Keeler PA-C Entered By: Worthy Keeler on 06/09/2021 14:21:25 [name], [name] ([id number]) -------------------------------------------------------------------------------- Progress Note Details Patient Name: [name] Date of Service: 06/09/2021 2:00 PM Medical Record Number: [id number] Patient Account Number: [id number] Date of Birth/Sex: 09-Aug-1923 (87 y.o. 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 Dr. Amalia Hailey the podiatrist saw her on 07/20/2017 and sent her to the wound center for possible hyperbaric oxygen therapy. past medical history of peripheral vascular disease, varicose veins, status post appendectomy, basal cell carcinoma excision from the left leg, cholecystectomy, pacemaker placement, right lower extremity angiography done by Dr. dew in March 2017 with placement of a stent. there is also note of a successful ablation of the right small saphenous vein done which was reviewed by ultrasound on 10/24/2016. the patient had a right small saphenous vein ablation done on 10/20/2016. The patient has never been a smoker. She has been seen  by Dr. Corene Cornea dew the vascular surgeon who most recently saw her on 06/15/2017 for evaluation of ongoing problems with right leg swelling. She had a lower extremity arterial duplex examination done(02/13/17) which showed patent distal right superficial  femoral artery stent and above-the-knee popliteal stent without evidence of restenosis. The ABI was more than 1.3 on the right and more than 1.3 on the left. This was consistent with noncompressible arteries due to medial calcification. The right great toe pressure and PPG waveforms are within normal limits and the left great toe pressure and PPG waveforms are decreased. he recommended she continue to wear her compression stockings and continue with elevation. She is scheduled to have a noninvasive arterial study in the near future 08/16/2017 -- had a lower extremity arterial duplex examination done which showed patent distal right superficial femoral artery stent and above-the-knee popliteal stent without evidence of restenosis. The ABI was more than 1.3 on the right and more than 1.3 on the left. This was consistent with noncompressible arteries due to medial calcification. The right great toe pressure and PPG waveforms are within normal limits and the left great toe pressure and PPG waveforms are decreased. the x-ray of the right ankle has not yet been done 08/24/2017 -- had a right ankle x-ray -- IMPRESSION:1. No fracture, bone lesion or evidence of osteomyelitis. 2. Lateral soft tissue swelling with a soft tissue ulcer. she has not yet seen the vascular surgeon for review 08/31/17 on evaluation today patient's wound appears to be showing signs of improvement. She still with her appointment with vascular in order to review her results of her vascular study and then determine if any intervention would be recommended at that time. No fevers, chills, nausea, or vomiting noted at this time. She has been tolerating the dressing changes without complication. 09/28/17 on evaluation today patient's wound appears to show signs of good improvement in regard to the granulation tissue which is surfacing. There is still a layer of slough covering the wound and the posterior portion is still significantly  deeper than the anterior nonetheless there has been some good sign of things moving towards the better. She is going to go back to Dr. dew for reevaluation to ensure her blood flow is still appropriate. That will be before her next evaluation with [place] next week. No fevers, chills, nausea, or vomiting noted at this time. Patient does have some discomfort rated to be a 3-4/10 depending on activity specifically cleansing the wound makes it worse. 10/05/2017 -- the patient was seen by Dr. Lucky Cowboy last week and noninvasive studies showed a normal right ABI with brisk triphasic waveforms consistent with no arterial insufficiency including normal digital pressures. The duplex showed a patent distal right SFA stent and the proximal SFA was also normal. He was pleased with her test and thought she should have enough of perfusion for normal wound healing. He would see her back in 6 months time. 12/21/17 on evaluation today patient appears to be doing fairly well in regard to her right lateral ankle wound. Unfortunately the main issue that she is expansion at this point is that she is having some issues with what appears to be some cellulitis in the right anterior shin. She has also been noting a little bit of uncomfortable feeling especially last night and her ankle area. I'm afraid that she made the developing a little bit of an infection. With that being said I think it is in the early stages. 12/28/17 on evaluation today patient's ankle  appears to be doing excellent. She's making good progress at this point the cellulitis seems to have improved after last week's evaluation. Overall she is having no significant discomfort which is excellent news. She does have an appointment with Dr. dew on March 29, 2018 for reevaluation in regard to the stent he placed. She seems to have excellent blood flow in the right lower extremity. 01/19/12 on evaluation today patient's wound appears to be doing very well. In fact she does  not appear to require debridement at this point, there's no evidence of infection, and overall from the standpoint of the wound she seems to be doing very well. With that being said I believe that it may be time to switch to different dressing away from the [facility] Dressing she tells me she does have a lot going on her friend actually passed away yesterday and she's also having a lot of issues with her husband this obviously is weighing heavy on her as far as your thoughts and concerns today. 01/25/18 on evaluation today patient appears to be doing fairly well in regard to her right lateral malleolus. She has been tolerating the dressing changes without complication. Overall I feel like this is definitely showing signs of improvement as far as how the overall appearance of the wound is there's also evidence of epithelium start to migrate over the granulation tissue. In general I think that she is progressing nicely as far as the wound is concerned. The only concern she really has is whether or not we can switch to every other week visits in order to avoid having as many [name], [name] ([id number]) appointments as her daughters have a difficult time getting her to her appointments as well as the patient's husband to his he is not doing very well at this point. 02/22/18 on evaluation today patient's right lateral malleolus ulcer appears to be doing great. She has been tolerating the dressing changes without complication. Overall you making excellent progress at this time. Patient is having no significant discomfort. 03/15/18 on evaluation today patient appears to be doing much more poorly in regard to her right lateral ankle ulcer at this point. Unfortunately since have last seen her her husband has passed just a few days ago is obviously weighed heavily on her her daughter also had surgery well she is with her today as usual. There does not appear to be any evidence of infection she does seem  to have significant contusion/deep tissue injury to the right lateral malleolus which was not noted previous when I saw her last. It's hard to tell of exactly when this injury occurred although during the time she was spending the night in the hospital this may have been most likely. 03/22/18 on evaluation today patient appears to actually be doing very well in regard to her ulcer. She did unfortunately have a setback which was noted last week however the good news is we seem to be getting back on track and in fact the wound in the core did still have some necrotic tissue which will be addressed at this point today but in general I'm seeing signs that things are on the up and up. She is glad to hear this obviously she's been somewhat concerned that due to the how her wound digressed more recently. 03/29/18 on evaluation today patient appears to be doing fairly well in regard to her right lower extremity lateral malleolus ulcer. She unfortunately does have a new area of pressure injury over the inferior  portion where the wound has opened up a little bit larger secondary to the pressure she seems to be getting. She does tell me sometimes when she sleeps at night that it actually hurts and does seem to be pushing on the area little bit more unfortunately. There does not appear to be any evidence of infection which is good news. She has been tolerating the dressing changes without complication. She also did have some bruising in the left second and third toes due to the fact that she may have bump this or injured it although she has neuropathy so she does not feel she did move recently that may have been where this came from. Nonetheless there does not appear to be any evidence of infection at this time. 04/12/18 on evaluation today patient's wound on the right lateral ankle actually appears to be doing a little bit better with a lot of necrotic docking tissue centrally loosening up in clearing away. However  she does have the beginnings of a deep tissue injury on the left lateral malleolus likely due to the fact we've been trying offload the right as much as we have. I think she may benefit from an assistive soft device to help with offloading and it looks like they're looking at one of the doughnut conditions that wraps around the lower leg to offload which I think will definitely do a good job. With that being said I think we definitely need to address this issue on the left before it becomes a wound. Patient is not having significant pain. 04/19/18 on evaluation today patient appears to be doing excellent in regard to the progress she's made with her right lateral ankle ulcer. The left ankle region which did show evidence of a deep tissue injury seems to be resolving there's little fluid noted underneath and a blister there's nothing open at this point in time overall I feel like this is progressing nicely which is good news. She does not seem to be having significant discomfort at this point which is also good news. 04/25/18-She is here in follow up evaluation for bilateral lateral malleolar ulcers. The right lateral malleolus ulcer with pale subcutaneous tissue exposure, central area of ulcer with tendon/periosteum exposed. The left lateral malleolus ulcer now with central area of nonviable tissue, otherwise deep tissue injury. She is wearing compression wraps to the left lower extremity, she will place the right lower extremity compression wraps on when she gets home. She will be out of town over the weekend and return next week and follow-up appointment. She completed her doxycycline this morning 05/03/18 on evaluation today patient appears to be doing very well in regard to her right lateral ankle ulcer in general. At least she's showing some signs of improvement in this regard. Unfortunately she has some additional injury to the left lateral malleolus region which appears to be new likely even over  the past several days. Again this determination is based on the overall appearance. With that being said the patient is obviously frustrated about this currently. 05/10/18-She is here in follow-up evaluation for bilateral lateral malleolar ulcers. She states she has purchased offloading shoes/boots and they will arrive tomorrow. She was asked to bring them in the office at next week's appointment so her provider is aware of product being utilized. She continues to sleep on right or left side, she has been encouraged to sleep on her back. The right lateral malleolus ulcer is precariously close to peri-osteum; will order xray. The left lateral  malleolus ulcer is improved. Will switch back to santyl; she will follow up next week. 05/17/18 on evaluation today patient actually appears to be doing very well in regard to her malleolus her ulcers compared to last time I saw them. She does not seem to have as much in the way of contusion at this point which is great news. With that being said she does continue to have discomfort and I do believe that she is still continuing to benefit from the offloading/pressure reducing boots that were recommended. I think this is the key to trying to get this to heal up completely. 05/24/18 on evaluation today patient actually appears to be doing worse at this point in time unfortunately compared to her last week's evaluation. She is having really no increased pain which is good news unfortunately she does have more maceration in your theme and noted surrounding the right lateral ankle the left lateral ankle is not really is erythematous I do not see signs of the overt cellulitis on that side. Unfortunately the wounds do not seem to have shown any signs of improvement since the last evaluation. She also has significant swelling especially on the right compared to previous some of this may be due to infection however also think that she may be served better while she has these  wounds by compression wrapping versus continuing to use the Juxta-Lite for the time being. Especially with the amount of drainage that she is experiencing at this point. No fevers, chills, nausea, or vomiting noted at this time. 05/31/18 on evaluation today patient appears to actually be doing better in regard to her right lateral lower extremity ulcer specifically on the malleolus region. She has been tolerating the antibiotic without complication. With that being said she still continues to have issues but a little bit of redness although nothing like she what she was experiencing previous. She still continues to pressure to her ankle area she did get the problem on offloading boots unfortunately she will not wear them she states there too uncomfortable and she can't get in and out of the bed. Nonetheless at this point her wounds seem to be continually getting worse which is not what we want I'm getting somewhat concerned about her progress and how things are going to proceed if we do not intervene in some way shape or form. I therefore had a very lengthy conversation today about offloading yet again and even made a specific suggestion for switching her to a memory foam mattress and even gave the information for a specific one that they could look at getting if it was something that they were interested in considering. She does not want to be considered for a hospital bed air mattress although honestly insurance would not cover it that she does not have any wounds on her trunk. 06/14/18 on evaluation today both wounds over the bilateral lateral malleolus her ulcers appear to be doing better there's no evidence of pressure injury at this point. She did get the foam mattress for her bed and this does seem to have been extremely beneficial for her in my pinion. Her daughter states that she is having difficulty getting out of bed because of how soft it is. The patient also relates this to be. Nonetheless I  do feel like she's actually doing better. Unfortunately right after and around the time she was getting the mattress she also sustained a fall when she got up to go pick up the phone and ended up injuring  her right elbow she has 18 sutures in place. We are not caring for this currently although home health is going to be taking the sutures out shortly. Nonetheless this may be something that we need to evaluate going forward. It depends on [name], [name] ([id number]) how well it has or has not healed in the end. She also recently saw an orthopedic specialist for an injection in the right shoulder just before her fall unfortunately the fall seems to have worsened her pain. 06/21/18 on evaluation today patient appears to be doing about the same in regard to her lateral malleolus ulcers. Both appear to be just a little bit deeper but again we are clinging away the necrotic and dead tissue which I think is why this is progressing towards a deeper realm as opposed to improving from my measurement standpoint in that regard. Nonetheless she has been tolerating the dressing changes she absolutely hates the memory foam mattress topper that was obtained for her nonetheless I do believe this is still doing excellent as far as taking care of excess pressure in regard to the lateral malleolus regions. She in fact has no pressure injury that I see whereas in weeks past it was week by week I was constantly seeing new pressure injuries. Overall I think it has been very beneficial for her. 07/03/18; patient arrives in my clinic today. She has deep punched out areas over her bilateral lateral malleoli. The area on the right has some more depth. We spent a lot of time today talking about pressure relief for these areas. This started when her daughter asked for a prescription for a memory foam mattress. I have never written a prescription for a mattress and I don't think insurances would pay for that on an ordinary  bed. In any case he came up that she has foam boots that she refuses to wear. I would suggest going to these before any other offloading issues when she is in bed. They say she is meticulous about offloading this the rest of the day 07/10/18- She is seen in follow-up evaluation for bilateral, lateral malleolus ulcers. There is no improvement in the ulcers. She has purchased and is sleeping on a memory foam mattress/overlay, she has been using the offloading boots nightly over the past week. She has a follow up appointment with vascular medicine at the end of October, in my opinion this follow up should be expedited given her deterioration and suboptimal TBI results. We will order plain film xray of the left ankle as deeper structures are palpable; would consider having MRI, regardless of xray report(s). The ulcers will be treated with iodoflex/iodosorb, she is unable to safely change the dressings daily with santyl. 07/19/18 on evaluation today patient appears to be doing in general visually well in regard to her bilateral lateral malleolus ulcers. She has been tolerating the dressing changes without complication which is good news. With that being said we did have an x-ray performed on 07/12/18 which revealed a slight loosen see in the lateral portion of the distal left fibula which may represent artifact but underline lytic destruction or osteomyelitis could not be excluded. MRI was recommended. With that being said we can see about getting the patient scheduled for an MRI to further evaluate this area. In fact we have that scheduled currently for August 20 19,019. 07/26/18 on evaluation today patient's wound on the right lateral ankle actually appears to be doing fairly well at this point in my pinion. She has made  some good progress currently. With that being said unfortunately in regard to the left lateral ankle ulcer this seems to be a little bit more problematic at this time. In fact as I further  evaluated the situation she actually had bone exposed which is the first time that's been the case in the bone appear to be necrotic. Currently I did review patient's note from Dr. Bunnie Domino office with [facility]. He stated that ABI was 1.26 on the right and 0.95 on the left with good waveforms. Her perfusion is stable not reduced from previous studies and her digital waveforms were pretty good particularly on the right. His conclusion upon review of the note was that there was not much she could do to improve her perfusion and he felt she was adequate for wound healing. His suggestion was that she continued to see [place] and consider a synthetic skin graft if there was no underlying infection. He plans to see her back in six months or as needed. 08/01/18 on evaluation today patient appears to be doing better in regard to her right lateral ankle ulcer. Her left lateral ankle ulcer is about the same she still has bone involvement in evidence of necrosis. There does not appear to be evidence of infection at this time On the right lateral lower extremity. I have started her on the Augmentin she picked this up and started this yesterday. This is to get her through until she sees infectious disease which is scheduled for 08/12/18. 08/06/18 on evaluation today patient appears to be doing rather well considering my discussion with patient's daughter at the end of last week. The area which was marked where she had erythema seems to be improved and this is good news. With that being said overall the patient seems to be making good improvement when it comes to the overall appearance of the right lateral ankle ulcer although this has been slow she at least is coming around in this regard. Unfortunately in regard to the left lateral ankle ulcer this is osteomyelitis based on the pathology report as well is bone culture. Nonetheless we are still waiting CT scan. Unfortunately the MRI we originally  ordered cannot be performed as the patient is a pacemaker which I had overlooked. Nonetheless we are working on the CT scan approval and scheduling as of now. She did go to the hospital over the weekend and was placed on IV Cefzo for a couple of days. Fortunately this seems to have improved the erythema quite significantly which is good news. There does not appear to be any evidence of worsening infection at this time. She did have some bleeding after the last debridement therefore I did not perform any sharp debridement in regard to left lateral ankle at this point. Patient has been approved for a snap vac for the right lateral ankle. 08/14/18; the patient with wounds over her bilateral lateral malleoli. The area on the right actually looks quite good. Been using a snap back on this area. Healthy granulation and appears to be filling in. Unfortunately the area on the left is really problematic. 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 Also noted to have cellulitis. She saw Dr. Novella Olive of infectious disease today and was put on linezolid. We are able to verify this with her pharmacy. She is completed the Augmentin that she was already on. We've been using Iodoflex to this area 08/23/18  on evaluation today patient's wounds both actually appear to be doing better compared to my prior evaluations. Fortunately she showing signs of good improvement in regard to the overall wound status especially where were using the snap vac on the right. In regard to left lateral malleolus the wound bed actually appears to be much cleaner than previously noted. I do not feel any phone directly probed during evaluation today and though there is tendon noted this does not appear to be necrotic it's actually fairly good as far as the overall appearance of the tendon is concerned. In general the wound bed actually appears to be doing  significantly better than it was previous. Patient is currently in the care of [name] and I did review that note today. He actually has her on two weeks of linezolid and then following the patient will be on 1-2 months of Keflex. That is the plan currently. She has been on antibiotics therapy as prescribed by myself initially starting on July 30, 2018 and has been on that continuously up to this point. 08/30/18 on evaluation today patient actually appears to be doing much better in regard to her right lateral malleolus ulcer. She has been tolerating the dressing changes specifically the snap vac without complication although she did have some issues with the seal currently. Apparently there was some trouble with getting it to maintain over the past week past Sunday. Nonetheless overall the wound appears better in regard to the right lateral malleolus region. In regard to left lateral malleolus this actually show some signs of additional granulation although there still tendon noted in the base of the wound this appears to be healthy not necrotic in any way whatsoever. We are considering potentially using a snap vac for the left lateral malleolus as well the product wrap from [employer], [place], was present in the clinic today we're going to see this patient I did have her come in with me after obtaining consent from the patient and her daughter in order to look at the wound and see if there's any recommendation one way or another as to whether or not they felt the snapback could be beneficial for the left lateral malleolus region. But [name], [name] ([id number]) the conclusion was that it might be but that this is definitely a little bit deeper wound than what traditionally would be utilized for a snap vac. 09/06/18 on evaluation today patient actually appears to be doing excellent in my pinion in regard to both ankle ulcers. She has been tolerating the dressing changes without complication which is  great news. Specifically we have been using the snap vac. In regard to the right ankle I'm not even sure that this is going to be necessary for today and following as the wound has filled in quite nicely. In regard to the left ankle I do believe that we're seeing excellent epithelialization from the edge as well as granulation in the central portion the tendon is still exposed but there's no evidence of necrotic bone and in general I feel like the patient has made excellent progress even compared to last week with just one week of the snap vac. 09/11/18; this is a patient who has wounds on her bilateral lateral malleoli. Initially both of these were deep stage IV wounds in the setting of chronic arterial insufficiency. She has been revascularized. As I understand think she been using snap vacs to both of these wounds however the area on the right became more superficial and currently  she is only using it on the left. Using silver collagen on the right and silver collagen under the back on the left I believe 09/19/18 on evaluation today patient actually appears to be doing very well in regard to her lateral malleolus or ulcers bilaterally. She has been tolerating the dressing changes without complication. Fortunately there does not appear to be any evidence of infection at this time. Overall I feel like she is improving in an excellent manner and I'm very pleased with the fact that everything seems to be turning towards the better for her. This has obviously been a long road. 09/27/18 on evaluation today patient actually appears to be doing very well in regard to her bilateral lateral malleolus ulcers. She has been tolerating the dressing changes without complication. Fortunately there does not appear to be any evidence of infection at this time which is also great news. No fevers, chills, nausea, or vomiting noted at this time. Overall I feel like she is doing excellent with the snap vac on the  left malleolus. She had 40 mL of fluid collection over the past week. 10/04/18 on evaluation today patient actually appears to be doing well in regard to her bilateral lateral malleolus ulcers. She continues to tolerate the dressing changes without complication. One issue that I see is the snap vac on the left lateral malleolus which appears to have sealed off some fluid underlying this area and has not really allowed it to heal to the degree that I would like to see. For that reason I did suggest at this point we may want to pack a small piece of packing strip into this region to allow it to more effectively wick out fluid. 10/11/18 in general the patient today does not feel that she has been doing very well. She's been a little bit lethargic and subsequently is having bodyaches as well according to what she tells me today. With that being said overall she has been concerned with the fact that something may be worsening although to be honest her wounds really have not been appearing poorly. She does have a new ulcer on her left heel unfortunately. This may be pressure related. Nonetheless it seems to me to have potentially started at least as a blister I do not see any evidence of deep tissue injury. In regard to the left ankle the snap vac still seems to be causing the ceiling off of the deeper part of the wound which is in turn trapping fluid. I'm not extremely pleased with the overall appearance as far as progress from last week to this week therefore I'm gonna discontinue the snap vac at this point. 10/18/18 patient unfortunately this point has not been feeling well for the past several days. She was seen by Grayland Ormond her primary care provider who is a Librarian, academic at [name]. Subsequently she states that she's been very weak and generally feeling malaise. No fevers, chills, nausea, or vomiting noted at this time. With that being said bloodwork was performed at the PCP office on  the 11th of this month which showed a white blood cell count of 10.7. This was repeated today and shows a white blood cell count of 12.4. This does show signs of worsening. Coupled with the fact that she is feeling worse and that her left ankle wound is not really showing signs of improvement I feel like this is an indication that the osteomyelitis is likely exacerbating not improving. Overall I think we may also  want to check her C-reactive protein and sedimentation rate. Actually did call [facility] office this afternoon while the patient was in the office here with me. Subsequently based on the findings we discussed treatment possibilities and I think that it is appropriate for [place] to go ahead and initiate treatment with doxycycline which I'm going to do. Subsequently he did agree to see about adding a CRP and sedimentation rate to her orders. If that has not already been drawn to where they can run it they will contact the patient she can come back to have that check. They are in agreement with plan as far as the patient and her daughter are concerned. Nonetheless also think we need to get in touch with Dr. Henreitta Leber office to see about getting the patient scheduled with him as soon as possible. 11/08/18 on evaluation today patient presents for follow-up concerning her bilateral foot and ankle ulcers. I did do an extensive review of her chart in epic today. Subsequently she was seen by [name] he did initiate Cefepime IV antibiotic therapy. Subsequently she had some issues with her PICC line this had to be removed because it was coiled and then replaced. Fortunately that was now settled. Unfortunately she has continued have issues with her left heel as well as the issues that she is experiencing with her bilateral lateral malleolus regions. I do believe however both areas seem to be doing a little bit better on evaluation today which is good news. No fevers, chills, nausea, or vomiting noted at  this time. She actually has an angiogram schedule with Dr. dew on this coming Monday, November 11, 2018. Subsequently the patient states that she is feeling much better especially than what she was roughly 2 weeks ago. She actually had to cancel an appointment because she was feeling so poorly. No fevers, chills, nausea, or vomiting noted at this time. 11/15/18 on evaluation today patient actually is status post having had her angiogram with Dr. dew Monday, four days ago. 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 He had to go and work on several areas of the vasculature fortunately he was able to obtain no more than a 30% residual stenosis throughout post procedure. I reviewed this note today. I think this will definitely help with healing at this time. Fortunately there does not appear to be any signs of infection and I do feel like ratio already has a better appearance to it. 11/22/18 upon evaluation today patient actually appears to be doing very well in regard to her wounds in general. The right lateral malleolus looks excellent the heel looks better in the left lateral malleolus also appears to be doing a little better. With that being said the right second toe actually appears to be open and training we been watching this is been dry and stable but now is open. 12/03/2018 Seen today for follow-up and management of multiple bilateral lower extremity wounds. New pressure injury of the great toe which is closed at this time. Wound of the right distal second toe appears larger today with deep undermining and a pocket of fluid present within the undermining region. Left and right malleolus is wounds are stable today with no signs and symptoms of infection.Denies any needs or concerns during exam today. 12/13/18 on evaluation today patient appears to be doing somewhat better in regard to her left heel ulcer. She also seems to be completely healed in regard to the right  lateral  malleolus ulcer. The left malleolus ulcer is smaller what unfortunately the wounds which are new over the first and second toes of the right foot are what are most concerning at this point especially the second. Both areas did require sharp debridement today. 12/20/18 on evaluation today patient's wound actually appears to be doing better in regard to left lateral ankle and her right lateral ankle continues to remain healed. The hill ulcer on the left is improved. She does have improvement noted as well in regard to both toe ulcers. Overall I'm very pleased in this regard. No fevers, chills, nausea, or vomiting noted at this time. BULAR, HICKOK ([id number]) 12/23/18 on evaluation today patient is seen after she had her toenails trimmed at the podiatrist office due to issues with her right great toe. There was what appeared to be dark eschar on the surface of the wound which had her in the podiatrist concerned. Nonetheless as I remember that during the last office visit I had utilize silver nitrate of this area I was much less concerned about the situation. Subsequently I was able to clean off much of this tissue without any complication today. This does not appear to show any signs of infection and actually look somewhat better compared to last time post debridement. Her second toe on the right foot actually had callous over and there did appear still be some fluid underneath this that would require debridement today. 12/27/18 on evaluation today patient actually appears to be showing signs of improvement at all locations. Even the left lateral ankle although this is not quite as great as the other sites. Fortunately there does not appear to be any signs of infection at this time and both of her toes on the right foot seem to be showing signs of improvement which is good news and very pleased in this regard. 01/03/19 on evaluation today patient appears to be doing better for the most part in  regard to her wounds in particular. There does not appear to be any evidence of infection at this time which is good news. Fortunately there is no sign of really worsening anywhere except for the right great toe which she does have what appears to be a bruise/deep tissue injury which is very superficial and already resolving. I'm not sure where this came from I questioned her extensively and she does not recall what may have happened with this. Other than that the patient seems to be doing well even the left lateral ankle ulcer looks good and is getting smaller. 01/10/19 on evaluation today patient appears to be doing well in regard to her left heel wound and both of her toe wounds. Overall I feel like there is definitely improvement here and I'm happy in that regard. With that being said unfortunately she is having issues with the left lateral malleolus ulcer which unfortunately still has a lot of depth to it. This is gonna be a very difficult wound for [place] to be able to truly get to heal. I may want to consider some type of skin substitute to see if this would be of benefit for her. I'll discuss this with her more the next visit most likely. This was something I thought about more at the end of the visit when I was Artie out of the room and the patient had been discharged. 01/17/19 on evaluation today patient appears to be doing very well in regard to her wounds in general. She's been making excellent progress at this time.  Fortunately there's no sign of infection at this time either. No fevers, chills, nausea, or vomiting noted at this time. The biggest issue is still her left lateral malleolus where it appears to be doing well and is getting smaller but still shows a small corner where this is deeper and goes down into what appears to be the joint space. Nonetheless this is taking much longer to heal although it still looks better in smaller than previous evaluations. 01/24/19 on evaluation today  patient's wounds actually appear to be doing rather well in general overall. She did require some sharp debridement in regard to the right great toe but everything else appears to be doing excellent no debridement was even necessary. No fevers, chills, nausea, or vomiting noted at this time. 01/31/19 on evaluation today patient actually appears to be doing much better in regard to her left foot wound on the heel as well as the ankle. The right great toe appears to be a little bit worse today this had callous over and trapped a lot of fluid underneath. Fortunately there's no signs of infection at any site which is great news. 02/07/19 on evaluation today patient actually appears to be doing decently well in regard to all of her ulcers at this point. No sharp debridement was required she is a little bit of hyper granulation in regard to the left lateral ankle as well as the left heel but the hill itself is almost completely healed which is excellent news. Overall been very pleased in this regard. 02/14/19 on evaluation today patient actually appears to be doing very well in regard to her ulcers on the right first toe, left lateral malleolus, and left heel. In fact the heel is almost completely healed at this point. The patient does not show any signs of infection which is good news. Overall very pleased with how things have progressed. 04/18/19 Telehealth Evaluation During the COVID-19 National Emergency: Verbal Consent: Obtained from patient Allergies: reviewed and the active list is current. Medication changes: patient has no current medication changes. COVID-19 Screening: 1. Have you traveled internationally or on a cruise ship in the last 14 dayso No 2. Have you had contact with someone with or under investigation for COVID-19o No 3. Have you had a fever, cough, sore throat, or experiencing shortness of breatho No on evaluation today actually did have a visit with this patient through a telehealth  encounter with her home health nurse. Subsequently it was noted that the patient actually appears to be doing okay in regard to her wounds both the right great toe as well as the left lateral malleolus have shown signs of improvement although this in your theme around the left lateral malleolus there eschar coverings for both locations. The question is whether or not they are actually close and whether or not home health needs to discharge the patient or not. Nonetheless my concern is this point obviously is that without actually seeing her and being able to evaluate this directly I cannot ensure that she is completely healed which is the question that I'm being asked. 04/22/19 on evaluation today patient presents for her first evaluation since last time I saw her which was actually February 14, 2019. I did do a telehealth visit last week in which point it was questionable whether or not she may be healed and had to bring her in today for confirmation. With that being said she does seem to be doing quite well at this point which is good news.  There does not appear to be any drainage in the deed I believe her wounds may be healed. Readmission: 09/04/2019 on evaluation today patient appears to be doing unfortunately somewhat more poorly in regard to her left foot ulcer secondary to a wound that began on 08/21/2019 at least when she first noticed this. Fortunately she has not had any evidence of active infection at this time. Systemically. I also do not necessarily see any evidence of infection at the blister/wound site on the first metatarsal head plantar aspect. This almost appears to be something that may have just rubbed inappropriately causing this to breakdown. They did not want a wait too long to come in to be seen as again she had significant issues in the past with wounds that took quite a while to heal in fact it was close to 2 years. Nonetheless this does not appear to be quite that bad but again  we do need to remove some of the necrotic tissue from the surface of the wound to tell exactly the extent. She does not appear to have any significant arterial disease at this point and again her last ABIs and TBI's are recorded above in the alert section her left ABI was 1.27 with a TBI of 0.72 to the right ABI 1.08 with a TBI of 0.39. Other than this the patient has been doing quite well since I last saw her and that was in May 2020. [name], [name] ([id number]) 09/11/2019 on evaluation today patient appeared to be doing very well with regard to her plantar foot ulcer on the left. In fact this appears to be almost completely healed which is awesome. That is after just 1 week of intervention. With that being said there is no signs of active infection at this time. 09/18/2019 on evaluation today patient actually appears to be doing excellent in fact she is completely healed based on what I am seeing at this point. Fortunately there is no signs of active infection at this time and overall patient is very pleased to hear that this area has healed so quickly. Readmission: 05/13/2021 upon evaluation today this patient presents for reevaluation here in the clinic. This is a wound that actually we previously took care of. She had 1 on the right ankle and the left the left turned out to be be harder due to to heal but nonetheless is doing great at this point as the right that has reopened and it was noted first just several weeks ago with a scab over it and came off in just the past few days. Fortunately there does not appear to be any obvious evidence of significant active infection at this time which is great news. No fevers, chills, nausea, vomiting, or diarrhea. 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 There does not appear to be any signs of this interfering in any way with her wound. She does have swelling we previously had compression socks for her  ordered but again it does not look like she wears these on a regular basis by any means. 05/26/2021 upon evaluation today patient appears to be doing well with regard to her wound which is actually showing signs of excellent improvement. There does not appear to be any signs of active infection which is great news and overall very pleased with where things stand today. No fevers, chills, nausea, vomiting, or diarrhea. 06/02/2021 upon evaluation today patient's wound actually showing signs of excellent improvement. Fortunately  there does not appear to be any signs of active infection which is great news. I think the patient is making good progress with regard to her wounds in general. 06/09/2021 upon evaluation today patient appears to be doing excellent in regard to her wounds currently. Fortunately there does not appear to be any signs of active infection which is great news. No fevers, chills, nausea, vomiting, or diarrhea. Overall extremely pleased with where things stand today. I think the patient is making excellent progress. Objective Constitutional Well-nourished and well-hydrated in no acute distress. Vitals Time Taken: 2:13 PM, Height: 62 in, Weight: 150 lbs, BMI: 27.4, Temperature: 98.6 F, Pulse: 91 bpm, Respiratory Rate: 18 breaths/min, Blood Pressure: 156/84 mmHg. Respiratory normal breathing without difficulty. Psychiatric this patient is able to make decisions and demonstrates good insight into disease process. Alert and Oriented x 3. pleasant and cooperative. General Notes: Patient's wound bed actually showed signs of good granulation and epithelization at this point. There does not appear to be any signs of infection which is great news and overall I am extremely pleased with where things stand today. Integumentary (Hair, Skin) Wound #7 status is Open. Original cause of wound was Gradually Appeared. The date acquired was: 05/12/2021. The wound has been in treatment 3 weeks. The  wound is located on the Right,Lateral Malleolus. The wound measures 0.9cm length x 0.8cm width x 0.2cm depth; 0.565cm^2 area and 0.113cm^3 volume. There is Fat Layer (Subcutaneous Tissue) exposed. There is no tunneling or undermining noted. There is a medium amount of serous drainage noted. The wound margin is distinct with the outline attached to the wound base. There is large (67-100%) red granulation within the wound bed. There is a small (1-33%) amount of necrotic tissue within the wound bed including Adherent Slough. Assessment Active Problems ICD-10 Pressure ulcer of right ankle, stage 3 Lymphedema, not elsewhere classified Venous insufficiency (chronic) (peripheral) Presence of cardiac pacemaker Long term (current) use of anticoagulants [name], [name] ([id number]) Procedures Wound #7 Pre-procedure diagnosis of Wound #7 is a Pressure Ulcer located on the Right,Lateral Malleolus . There was a Three Layer Compression Therapy Procedure by Donnamarie Poag, RN. Post procedure Diagnosis Wound #7: Same as Pre-Procedure Plan Follow-up Appointments: Return Appointment in 1 week. Nurse Visit as needed Bathing/ Shower/ Hygiene: May shower with wound dressing protected with water repellent cover or cast protector. Edema Control - Lymphedema / Segmental Compressive Device / Other: Optional: One layer of unna paste to top of compression wrap (to act as an anchor). Elevate, Exercise Daily and Avoid Standing for Long Periods of Time. Elevate legs to the level of the heart and pump ankles as often as possible Elevate leg(s) parallel to the floor when sitting. WOUND #7: - Malleolus Wound Laterality: Right, Lateral Cleanser: Soap and Water 1 x Per Week/30 Days Discharge Instructions: Gently cleanse wound with antibacterial soap, rinse and pat dry prior to dressing wounds Primary Dressing: Hydrofera Blue Ready Transfer Foam, 2.5x2.5 (in/in) 1 x Per Week/30 Days Discharge Instructions: Apply  Hydrofera Blue Ready to wound bed as directed Secondary Dressing: ABD Pad 5x9 (in/in) 1 x Per Week/30 Days Discharge Instructions: Cover with ABD pad Compression Wrap: Profore Lite LF 3 Multilayer Compression Bandaging System 1 x Per Week/30 Days Discharge Instructions: Apply 3 multi-layer wrap as prescribed. 1. Would recommend that we going continue with the wound care measures as before and the patient is in agreement with that plan. This includes the use of the [facility] dressing which I think is doing  a good job. 2. I am also can recommend that we continue with ABD pad to cover followed by 3 layer compression wrap. 3. I am also can recommend that we have the patient continue to elevate her legs much as possible to try to help with edema control. Obviously I think this is of utmost importance as far as healing is concerned to continue to elevate as much as she can. We will see patient back for reevaluation in 1 week here in the clinic. If anything worsens or changes patient will contact our office for additional recommendations. Electronic Signature(s) Signed: 06/09/2021 2:45:23 PM By: Worthy Keeler PA-C Entered By: Worthy Keeler on 06/09/2021 14:45:22 Gillison, [name] ([id number]) -------------------------------------------------------------------------------- SuperBill Details Patient Name: [name] Date of Service: 06/09/2021 Medical Record Number: [id number] Patient Account Number: [id number] Date of Birth/Sex: 18-Jan-1923 (87 y.o. F) Treating RN: Donnamarie Poag Primary Care Provider: Adrian Prows Other Clinician: Referring Provider: Adrian Prows Treating Provider/Extender: Skipper Cliche in Treatment: 3 Diagnosis Coding ICD-10 Codes Code Description [phone] Pressure ulcer of right ankle, stage 3 I89.0 Lymphedema, not elsewhere classified I87.2 Venous insufficiency (chronic) (peripheral) Z95.0 Presence of cardiac pacemaker Z79.01 Long term (current)  use of anticoagulants Facility Procedures CPT4 Code: [id number] Description: (Facility Use Only) [phone] - [place] [facility] RT LEG Modifier: Quantity: 1 Physician Procedures CPT4 Code: 9381017 Description: 51025 - WC PHYS LEVEL 3 - EST PT Modifier: Quantity: 1 CPT4 Code: Description: ICD-10 Diagnosis Description 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, stage 3 I89.0 Lymphedema, not elsewhere classified I87.2 Venous insufficiency (chronic) (peripheral) Z95.0 Presence of cardiac pacemaker Modifier: Quantity: Electronic Signature(s) Signed: 06/09/2021 2:45:37 PM By: Worthy Keeler PA-C Entered By: Worthy Keeler on 06/09/2021 14:45:37

## 2021-06-10 NOTE — Progress Notes (Signed)
[name], [name] ([id number]) Visit Report for 06/09/2021 Arrival Information Details Patient Name: [name] Date of Service: 06/09/2021 2:00 PM Medical Record Number: [id number] Patient Account Number: [id number] Date of Birth/Sex: 1923/03/24 (87 y.o. F) Treating RN: Donnamarie Poag Primary Care Price Lachapelle: Adrian Prows Other Clinician: Referring Lenise Jr: Adrian Prows Treating Shanavia Makela/Extender: Skipper Cliche in Treatment: 3 Visit Information History Since Last Visit Added or deleted any medications: No Patient Arrived: [name] Had a fall or experienced change in No Arrival Time: 14:15 activities of daily living that may affect Accompanied By: son risk of falls: Transfer Assistance: None Hospitalized since last visit: No Patient Identification Verified: Yes Has Dressing in Place as Prescribed: Yes Secondary Verification Process Completed: Yes Has Compression in Place as Prescribed: Yes Patient Has Alerts: Yes Pain Present Now: No Patient Alerts: Patient on Blood Thinner NOT DIABETIC ***ELIQUIS*** Electronic Signature(s) Signed: 06/10/2021 3:14:45 PM By: Donnamarie Poag Entered By: Donnamarie Poag on 06/09/2021 14:16:09 [name] ([id number]) -------------------------------------------------------------------------------- Clinic Level of Care Assessment Details Patient Name: [name] Date of Service: 06/09/2021 2:00 PM Medical Record Number: [id number] Patient Account Number: [id number] Date of Birth/Sex: 1923-11-25 (87 y.o. F) Treating RN: Donnamarie Poag Primary Care Sharda Keddy: Adrian Prows Other Clinician: Referring Laverne Klugh: Adrian Prows Treating Keiarah Orlowski/Extender: Skipper Cliche in Treatment: 3 Clinic Level of Care Assessment Items TOOL 1 Quantity Score _0  - Use when EandM and Procedure is performed on INITIAL visit 0 ASSESSMENTS - Nursing Assessment / Reassessment _1  - General Physical Exam (combine w/ comprehensive assessment  (listed just below) when performed on new 0 pt. evals) _2  - 0 Comprehensive Assessment (HX, ROS, Risk Assessments, Wounds Hx, etc.) ASSESSMENTS - Wound and Skin Assessment / Reassessment _3  - Dermatologic / Skin Assessment (not related to wound area) 0 ASSESSMENTS - Ostomy and/or Continence Assessment and Care _4  - Incontinence Assessment and Management 0 _5  - 0 Ostomy Care Assessment and Management (repouching, etc.) PROCESS - Coordination of Care _6  - Simple Patient / Family Education for ongoing care 0 _7  - 0 Complex (extensive) Patient / Family Education for ongoing care _8  - 0 Staff obtains Programmer, systems, Records, Test Results / Process Orders _9  - 0 Staff telephones HHA, Nursing Homes / Clarify orders / etc _10  - 0 Routine Transfer to another Facility (non-emergent condition) _11  - 0 Routine Hospital Admission (non-emergent condition) _12  - 0 New Admissions / Biomedical engineer / Ordering NPWT, Apligraf, etc. _13  - 0 Emergency Hospital Admission (emergent condition) PROCESS - Special Needs _14  - Pediatric / Minor Patient Management 0 _15  - 0 Isolation Patient Management _16  - 0 Hearing / Language / Visual special needs _17  - 0 Assessment of Community assistance (transportation, D/C planning, etc.) _18  - 0 Additional assistance / Altered mentation _19  - 0 Support Surface(s) Assessment (bed, cushion, seat, etc.) INTERVENTIONS - Miscellaneous _20  - External ear exam 0 _21  - 0 Patient Transfer (multiple staff / Civil Service fast streamer / Similar devices) _22  - 0 Simple Staple / Suture removal (25 or less) _23  - 0 Complex Staple / Suture removal (26 or more) _24  - 0 Hypo/Hyperglycemic Management (do not check if billed separately) _25  - 0 Ankle / Brachial Index (ABI) - do not check if billed separately Has the patient been seen at the hospital within the last three years: Yes Total Score: 0 Level Of Care: ____ [name] ([id number]) Electronic Signature(s) Signed: 06/10/2021  3:14:45 PM By: Donnamarie Poag Entered By: Donnamarie Poag on 06/09/2021 14:36:40 [name] ([id number]) -------------------------------------------------------------------------------- Compression Therapy Details Patient Name:  [name] Date of Service: 06/09/2021 2:00 PM Medical Record Number: [id number] Patient Account Number: [id number] Date of Birth/Sex: May 11, 1923 (87 y.o. F) Treating RN: Donnamarie Poag Primary Care Griffith Santilli: Adrian Prows Other Clinician: Referring Sade Mehlhoff: Adrian Prows Treating Nicosha Struve/Extender: Skipper Cliche in Treatment: 3 Compression Therapy Performed for Wound Assessment: Wound #7 Right,Lateral Malleolus Performed By: Junius Argyle, RN Compression Type: Three Layer Post Procedure Diagnosis Same as Pre-procedure Electronic Signature(s) Signed: 06/10/2021 3:14:45 PM By: Donnamarie Poag Entered By: Donnamarie Poag on 06/09/2021 14:33:00 [name] ([id number]) -------------------------------------------------------------------------------- Encounter Discharge Information Details Patient Name: [name] Date of Service: 06/09/2021 2:00 PM Medical Record Number: [id number] Patient Account Number: [id number] Date of Birth/Sex: October 12, 1923 (87 y.o. F) Treating RN: Donnamarie Poag Primary Care Porter Nakama: Adrian Prows Other Clinician: Referring Ilo Beamon: Adrian Prows Treating Kashon Kraynak/Extender: Skipper Cliche in Treatment: 3 Encounter Discharge Information Items Discharge Condition: Stable Ambulatory Status: Walker Discharge Destination: Home Transportation: Private Auto Accompanied By: son Schedule Follow-up Appointment: Yes Clinical Summary of Care: Electronic Signature(s) Signed: 06/10/2021 3:14:45 PM By: Donnamarie Poag Entered By: Donnamarie Poag on 06/09/2021 14:44:08 Devaux, [name] ([id number]) -------------------------------------------------------------------------------- Lower Extremity Assessment  Details Patient Name: [name] Date of Service: 06/09/2021 2:00 PM Medical Record Number: [id number] Patient Account Number: [id number] Date of Birth/Sex: 02/22/1923 (87 y.o. F) Treating RN: Donnamarie Poag Primary Care Zabrina Brotherton: Adrian Prows Other Clinician: Referring Lior Hoen: Adrian Prows Treating Leib Elahi/Extender: Skipper Cliche in Treatment: 3 Edema Assessment Assessed: [Left: No] [Right: Yes] [Left: Edema] [Right: :] Calf Left: Right: Point of Measurement: 33 cm From Medial Instep 29 cm Ankle Left: Right: Point of Measurement: 10 cm From Medial Instep 18 cm Knee To Floor Left: Right: From Medial Instep 39 cm Vascular Assessment Pulses: Dorsalis Pedis Palpable: [Right:Yes] Electronic Signature(s) Signed: 06/10/2021 3:14:45 PM By: Donnamarie Poag Entered By: Donnamarie Poag on 06/09/2021 14:22:41 Calixte, [name] ([id number]) -------------------------------------------------------------------------------- Multi Wound Chart Details Patient Name: [name] Date of Service: 06/09/2021 2:00 PM Medical Record Number: [id number] Patient Account Number: [id number] Date of Birth/Sex: 26-Jan-1923 (87 y.o. F) Treating RN: Donnamarie Poag Primary Care Keauna Brasel: Adrian Prows Other Clinician: Referring Gisella Alwine: Adrian Prows Treating Shyleigh Daughtry/Extender: Skipper Cliche in Treatment: 3 Vital Signs Height(in): 2 Pulse(bpm): 5 Weight(lbs): 150 Blood Pressure(mmHg): 156/84 Body Mass Index(BMI): 27 Temperature(F): 98.6 Respiratory Rate(breaths/min): 18 Photos: [N/A:N/A] Wound Location: Right, Lateral Malleolus N/A N/A Wounding Event: Gradually Appeared N/A N/A Primary Etiology: Pressure Ulcer N/A N/A Comorbid History: Cataracts, Congestive Heart Failure, N/A N/A Hypertension, Peripheral Arterial Disease, Osteoarthritis, Neuropathy Date Acquired: 05/12/2021 N/A N/A Weeks of Treatment: 3 N/A N/A Wound Status: Open N/A N/A Measurements L x W x D (cm)  0.9x0.8x0.2 N/A N/A Area (cm) : 0.565 N/A N/A Volume (cm) : 0.113 N/A N/A % Reduction in Area: 49.70% N/A N/A % Reduction in Volume: 49.80% N/A N/A Classification: Category/Stage III N/A N/A Exudate Amount: Medium N/A N/A Exudate Type: Serous N/A N/A Exudate Color: amber N/A N/A Wound Margin: Distinct, outline attached N/A N/A Granulation Amount: Large (67-100%) N/A N/A Granulation Quality: Red N/A N/A Necrotic Amount: Small (1-33%) N/A N/A Exposed Structures: Fat Layer (Subcutaneous Tissue): N/A N/A Yes Fascia: No Tendon: No Muscle: No Joint: No Bone: No Epithelialization: Small (1-33%) N/A N/A Treatment Notes Electronic Signature(s) Signed: 06/10/2021 3:14:45 PM By: Donnamarie Poag Entered By: Donnamarie Poag on 06/09/2021 14:23:57 Rossner, [name] ([id number]) -------------------------------------------------------------------------------- [place] Details Patient Name: [name] Date of Service: 06/09/2021 2:00 PM Medical Record Number: [id number] Patient Account Number: [id number] Date  of Birth/Sex: 1922/12/28 (87 y.o. F) Treating RN: Donnamarie Poag Primary Care Blue Ruggerio: Adrian Prows Other Clinician: Referring Kennice Finnie: Adrian Prows Treating Capitola Ladson/Extender: Skipper Cliche in Treatment: 3 Active Inactive Wound/Skin Impairment Nursing Diagnoses: Knowledge deficit related to ulceration/compromised skin integrity Goals: Patient/caregiver will verbalize understanding of skin care regimen Date Initiated: 05/13/2021 Date Inactivated: 05/13/2021 Target Resolution Date: 06/12/2021 Goal Status: Met Ulcer/skin breakdown will have a volume reduction of 30% by week 4 Date Initiated: 05/13/2021 Target Resolution Date: 06/12/2021 Goal Status: Active Ulcer/skin breakdown will have a volume reduction of 50% by week 8 Date Initiated: 05/13/2021 Target Resolution Date: 07/13/2021 Goal Status: Active Ulcer/skin breakdown will have a volume reduction  of 80% by week 12 Date Initiated: 05/13/2021 Target Resolution Date: 08/13/2021 Goal Status: Active Ulcer/skin breakdown will heal within 14 weeks Date Initiated: 05/13/2021 Target Resolution Date: 09/12/2021 Goal Status: Active Interventions: Assess patient/caregiver ability to obtain necessary supplies Assess patient/caregiver ability to perform ulcer/skin care regimen upon admission and as needed Assess ulceration(s) every visit Notes: Electronic Signature(s) Signed: 06/10/2021 3:14:45 PM By: Donnamarie Poag Entered By: Donnamarie Poag on 06/09/2021 14:23:48 Dominic, [name] ([id number]) -------------------------------------------------------------------------------- Pain Assessment Details Patient Name: [name] Date of Service: 06/09/2021 2:00 PM Medical Record Number: [id number] Patient Account Number: [id number] Date of Birth/Sex: March 31, 1923 (87 y.o. F) Treating RN: Donnamarie Poag Primary Care Keaton Beichner: Adrian Prows Other Clinician: Referring Olubunmi Rothenberger: Adrian Prows Treating Ayelet Gruenewald/Extender: Skipper Cliche in Treatment: 3 Active Problems Location of Pain Severity and Description of Pain Patient Has Paino No Site Locations Rate the pain. Current Pain Level: 0 Pain Management and Medication Current Pain Management: Electronic Signature(s) Signed: 06/10/2021 3:14:45 PM By: Donnamarie Poag Entered By: Donnamarie Poag on 06/09/2021 14:17:20 Sherbert, [name] ([id number]) -------------------------------------------------------------------------------- Patient/Caregiver Education Details Patient Name: [name] Date of Service: 06/09/2021 2:00 PM Medical Record Number: [id number] Patient Account Number: [id number] Date of Birth/Gender: 1923-07-11 (87 y.o. F) Treating RN: Donnamarie Poag Primary Care Physician: Adrian Prows Other Clinician: Referring Physician: Adrian Prows Treating Physician/Extender: Skipper Cliche in Treatment: 3 Education  Assessment Education Provided To: Patient Education Topics Provided Wound/Skin Impairment: Electronic Signature(s) Signed: 06/10/2021 3:14:45 PM By: Donnamarie Poag Entered By: Donnamarie Poag on 06/09/2021 14:33:11 Nauert, [name] ([id number]) -------------------------------------------------------------------------------- Wound Assessment Details Patient Name: [name] Date of Service: 06/09/2021 2:00 PM Medical Record Number: [id number] Patient Account Number: [id number] Date of Birth/Sex: [birth year] (87 y.o. F) Treating RN: Donnamarie Poag Primary Care Dakari Stabler: Adrian Prows Other Clinician: Referring Ashya Nicolaisen: Adrian Prows Treating Rhys Lichty/Extender: Skipper Cliche in Treatment: 3 Wound Status Wound Number: 7 Primary Pressure Ulcer Etiology: Wound Location: Right, Lateral Malleolus Wound Open Wounding Event: Gradually Appeared Status: Date Acquired: 05/12/2021 Comorbid Cataracts, Congestive Heart Failure, Hypertension, Weeks Of Treatment: 3 History: Peripheral Arterial Disease, Osteoarthritis, Neuropathy Clustered Wound: No Photos Wound Measurements Length: (cm) 0.9 Width: (cm) 0.8 Depth: (cm) 0.2 Area: (cm) 0.565 Volume: (cm) 0.113 % Reduction in Area: 49.7% % Reduction in Volume: 49.8% Epithelialization: Small (1-33%) Tunneling: No Undermining: No Wound Description Classification: Category/Stage III Wound Margin: Distinct, outline attached Exudate Amount: Medium Exudate Type: Serous Exudate Color: amber Foul Odor After Cleansing: No Slough/Fibrino Yes Wound Bed Granulation Amount: Large (67-100%) Exposed Structure Granulation Quality: Red Fascia Exposed: No Necrotic Amount: Small (1-33%) Fat Layer (Subcutaneous Tissue) Exposed: Yes Necrotic Quality: Adherent Slough Tendon Exposed: No Muscle Exposed: No Joint Exposed: No Bone Exposed: No Treatment Notes Wound #7 (Malleolus) Wound Laterality: Right, Lateral Cleanser Soap and  Water Discharge Instruction: Gently cleanse wound with antibacterial  soap, rinse and pat dry prior to dressing wounds Peri-Wound Care ZAKIA, SAINATO ([id number]) Topical Primary Dressing Hydrofera Blue Ready Transfer Foam, 2.5x2.5 (in/in) Discharge Instruction: Apply Hydrofera Blue Ready to wound bed as directed Secondary Dressing ABD Pad 5x9 (in/in) Discharge Instruction: Cover with ABD pad Secured With Compression Wrap Profore Lite LF 3 Multilayer Compression Cassandra Discharge Instruction: Apply 3 multi-layer wrap as prescribed. Compression Stockings Add-Ons Electronic Signature(s) Signed: 06/10/2021 3:14:45 PM By: Donnamarie Poag Entered By: Donnamarie Poag on 06/09/2021 14:21:17 Haug, [name] ([id number]) -------------------------------------------------------------------------------- [place] Details Patient Name: [name] Date of Service: 06/09/2021 2:00 PM Medical Record Number: [id number] Patient Account Number: [id number] Date of Birth/Sex: March 20, 1923 (87 y.o. F) Treating RN: Donnamarie Poag Primary Care Teliah Buffalo: Adrian Prows Other Clinician: Referring Wray Goehring: Adrian Prows Treating Daegon Deiss/Extender: Skipper Cliche in Treatment: 3 Vital Signs Time Taken: 14:13 Temperature (F): 98.6 Height (in): 62 Pulse (bpm): 91 Weight (lbs): 150 Respiratory Rate (breaths/min): 18 Body Mass Index (BMI): 27.4 Blood Pressure (mmHg): 156/84 Reference Range: 80 - 120 mg / dl Electronic Signature(s) Signed: 06/10/2021 3:14:45 PM By: Donnamarie Poag Entered ByDonnamarie Poag on 06/09/2021 14:16:59

## 2021-06-16 ENCOUNTER — Encounter: Payer: Medicare Other | Admitting: Physician Assistant

## 2021-06-16 ENCOUNTER — Other Ambulatory Visit: Payer: Self-pay

## 2021-06-16 DIAGNOSIS — L89513 Pressure ulcer of right ankle, stage 3: Secondary | ICD-10-CM | POA: Diagnosis not present

## 2021-06-16 NOTE — Progress Notes (Signed)
JUANICE, WARBURTON ([id number]) Visit Report for 06/16/2021 Arrival Information Details Patient Name: [name], [name] Date of Service: 06/16/2021 2:00 PM Medical Record Number: [id number] Patient Account Number: [id number] Date of Birth/Sex: 06/20/1923 (87 y.o. F) Treating RN: Donnamarie Poag Primary Care Alawna Graybeal: Adrian Prows Other Clinician: Referring Rosealyn Little: Adrian Prows Treating Aubrey Blackard/Extender: Skipper Cliche in Treatment: 4 Visit Information History Since Last Visit Added or deleted any medications: No Patient Arrived: [name] Had a fall or experienced change in No Arrival Time: 14:24 activities of daily living that may affect Accompanied By: daughter risk of falls: Transfer Assistance: None Hospitalized since last visit: No Patient Identification Verified: Yes Has Dressing in Place as Prescribed: Yes Secondary Verification Process Completed: Yes Has Compression in Place as Prescribed: Yes Patient Has Alerts: Yes Pain Present Now: Yes Patient Alerts: Patient on Blood Thinner NOT DIABETIC ***ELIQUIS*** Electronic Signature(s) Signed: 06/16/2021 4:28:40 PM By: Donnamarie Poag Entered By: Donnamarie Poag on 06/16/2021 14:28:20 Loeper, [name] ([id number]) -------------------------------------------------------------------------------- Clinic Level of Care Assessment Details Patient Name: [name] Date of Service: 06/16/2021 2:00 PM Medical Record Number: [id number] Patient Account Number: [id number] Date of Birth/Sex: 12-29-1922 (87 y.o. F) Treating RN: Donnamarie Poag Primary Care Ireta Pullman: Adrian Prows Other Clinician: Referring Kandon Hosking: Adrian Prows Treating Haely Leyland/Extender: Skipper Cliche in Treatment: 4 Clinic Level of Care Assessment Items TOOL 1 Quantity Score '[]'  - Use when EandM and Procedure is performed on INITIAL visit 0 ASSESSMENTS - Nursing Assessment / Reassessment '[]'  - General Physical Exam (combine w/ comprehensive  assessment (listed just below) when performed on new 0 pt. evals) '[]'  - 0 Comprehensive Assessment (HX, ROS, Risk Assessments, Wounds Hx, etc.) ASSESSMENTS - Wound and Skin Assessment / Reassessment '[]'  - Dermatologic / Skin Assessment (not related to wound area) 0 ASSESSMENTS - Ostomy and/or Continence Assessment and Care '[]'  - Incontinence Assessment and Management 0 '[]'  - 0 Ostomy Care Assessment and Management (repouching, etc.) PROCESS - Coordination of Care '[]'  - Simple Patient / Family Education for ongoing care 0 '[]'  - 0 Complex (extensive) Patient / Family Education for ongoing care '[]'  - 0 Staff obtains Programmer, systems, Records, Test Results / Process Orders '[]'  - 0 Staff telephones HHA, Nursing Homes / Clarify orders / etc '[]'  - 0 Routine Transfer to another Facility (non-emergent condition) '[]'  - 0 Routine Hospital Admission (non-emergent condition) '[]'  - 0 New Admissions / Biomedical engineer / Ordering NPWT, Apligraf, etc. '[]'  - 0 Emergency Hospital Admission (emergent condition) PROCESS - Special Needs '[]'  - Pediatric / Minor Patient Management 0 '[]'  - 0 Isolation Patient Management '[]'  - 0 Hearing / Language / Visual special needs '[]'  - 0 Assessment of Community assistance (transportation, D/C planning, etc.) '[]'  - 0 Additional assistance / Altered mentation '[]'  - 0 Support Surface(s) Assessment (bed, cushion, seat, etc.) INTERVENTIONS - Miscellaneous '[]'  - External ear exam 0 '[]'  - 0 Patient Transfer (multiple staff / Civil Service fast streamer / Similar devices) '[]'  - 0 Simple Staple / Suture removal (25 or less) '[]'  - 0 Complex Staple / Suture removal (26 or more) '[]'  - 0 Hypo/Hyperglycemic Management (do not check if billed separately) '[]'  - 0 Ankle / Brachial Index (ABI) - do not check if billed separately Has the patient been seen at the hospital within the last three years: Yes Total Score: 0 Level Of Care: ____ [name] ([id number]) Electronic Signature(s) Signed:  06/16/2021 4:28:40 PM By: Donnamarie Poag Entered By: Donnamarie Poag on 06/16/2021 14:49:38 Kujawa, [name] ([id number]) -------------------------------------------------------------------------------- Compression Therapy Details Patient Name:  Kitchens, [name] Date of Service: 06/16/2021 2:00 PM Medical Record Number: [id number] Patient Account Number: [id number] Date of Birth/Sex: 1923-03-20 (87 y.o. F) Treating RN: Donnamarie Poag Primary Care Kellon Chalk: Adrian Prows Other Clinician: Referring Anael Rosch: Adrian Prows Treating Rafaella Kole/Extender: Skipper Cliche in Treatment: 4 Compression Therapy Performed for Wound Assessment: Wound #7 Right,Lateral Malleolus Performed By: Junius Argyle, RN Compression Type: Three Layer Post Procedure Diagnosis Same as Pre-procedure Electronic Signature(s) Signed: 06/16/2021 4:28:40 PM By: Donnamarie Poag Entered By: Donnamarie Poag on 06/16/2021 14:46:37 Boline, [name] ([id number]) -------------------------------------------------------------------------------- Encounter Discharge Information Details Patient Name: [name] Date of Service: 06/16/2021 2:00 PM Medical Record Number: [id number] Patient Account Number: [id number] Date of Birth/Sex: 06/20/1923 (87 y.o. F) Treating RN: Donnamarie Poag Primary Care Ian Cavey: Adrian Prows Other Clinician: Referring Alizea Pell: Adrian Prows Treating Christoher Drudge/Extender: Skipper Cliche in Treatment: 4 Encounter Discharge Information Items Post Procedure Vitals Discharge Condition: Stable Temperature (F): 98.2 Ambulatory Status: Walker Pulse (bpm): 85 Discharge Destination: Home Respiratory Rate (breaths/min): 18 Transportation: Private Auto Blood Pressure (mmHg): 119/59 Accompanied By: daughter Schedule Follow-up Appointment: Yes Clinical Summary of Care: Electronic Signature(s) Signed: 06/16/2021 4:28:40 PM By: Donnamarie Poag Entered By: Donnamarie Poag on 06/16/2021  15:02:05 Schleicher, [name] ([id number]) -------------------------------------------------------------------------------- Lower Extremity Assessment Details Patient Name: [name] Date of Service: 06/16/2021 2:00 PM Medical Record Number: [id number] Patient Account Number: [id number] Date of Birth/Sex: 24-Aug-1923 (87 y.o. F) Treating RN: Donnamarie Poag Primary Care Destyne Goodreau: Adrian Prows Other Clinician: Referring Salih Williamson: Adrian Prows Treating Grady Mohabir/Extender: Skipper Cliche in Treatment: 4 Edema Assessment Assessed: [Left: No] [Right: Yes] Edema: [Left: N] [Right: o] Calf Left: Right: Point of Measurement: 33 cm From Medial Instep 28.5 cm Ankle Left: Right: Point of Measurement: 10 cm From Medial Instep 18 cm Vascular Assessment Pulses: Dorsalis Pedis Palpable: [Right:Yes] Electronic Signature(s) Signed: 06/16/2021 4:28:40 PM By: Donnamarie Poag Entered By: Donnamarie Poag on 06/16/2021 14:36:37 Cashaw, [name] ([id number]) -------------------------------------------------------------------------------- Multi Wound Chart Details Patient Name: [name] Date of Service: 06/16/2021 2:00 PM Medical Record Number: [id number] Patient Account Number: [id number] Date of Birth/Sex: [birth year] (87 y.o. F) Treating RN: Donnamarie Poag Primary Care Kunaal Walkins: Adrian Prows Other Clinician: Referring Emmelia Holdsworth: Adrian Prows Treating Timara Loma/Extender: Skipper Cliche in Treatment: 4 Vital Signs Height(in): 34 Pulse(bpm): 49 Weight(lbs): 150 Blood Pressure(mmHg): 119/59 Body Mass Index(BMI): 27 Temperature(F): 98.2 Respiratory Rate(breaths/min): 18 Photos: [N/A:N/A] Wound Location: Right, Lateral Malleolus N/A N/A Wounding Event: Gradually Appeared N/A N/A Primary Etiology: Pressure Ulcer N/A N/A Comorbid History: Cataracts, Congestive Heart Failure, N/A N/A Hypertension, Peripheral Arterial Disease, Osteoarthritis, Neuropathy Date Acquired:  05/12/2021 N/A N/A Weeks of Treatment: 4 N/A N/A Wound Status: Open N/A N/A Measurements L x W x D (cm) 0.4x0.5x0.2 N/A N/A Area (cm) : 0.157 N/A N/A Volume (cm) : 0.031 N/A N/A % Reduction in Area: 86.00% N/A N/A % Reduction in Volume: 86.20% N/A N/A Classification: Category/Stage III N/A N/A Exudate Amount: Medium N/A N/A Exudate Type: Serous N/A N/A Exudate Color: amber N/A N/A Wound Margin: Distinct, outline attached N/A N/A Granulation Amount: Large (67-100%) N/A N/A Granulation Quality: Red, Pink, Pale N/A N/A Necrotic Amount: Small (1-33%) N/A N/A Exposed Structures: Fat Layer (Subcutaneous Tissue): N/A N/A Yes Fascia: No Tendon: No Muscle: No Joint: No Bone: No Epithelialization: Small (1-33%) N/A N/A Treatment Notes Electronic Signature(s) Signed: 06/16/2021 4:28:40 PM By: Donnamarie Poag Entered By: Donnamarie Poag on 06/16/2021 14:38:36 Mccowen, [name] ([id number]) -------------------------------------------------------------------------------- [place] Details Patient Name: [name] Date of Service: 06/16/2021 2:00  PM Medical Record Number: [id number] Patient Account Number: [id number] Date of Birth/Sex: December 30, 1922 (87 y.o. F) Treating RN: Donnamarie Poag Primary Care Zaylan Kissoon: Adrian Prows Other Clinician: Referring Jamaris Theard: Adrian Prows Treating Jakin Pavao/Extender: Skipper Cliche in Treatment: 4 Active Inactive Wound/Skin Impairment Nursing Diagnoses: Knowledge deficit related to ulceration/compromised skin integrity Goals: Patient/caregiver will verbalize understanding of skin care regimen Date Initiated: 05/13/2021 Date Inactivated: 05/13/2021 Target Resolution Date: 06/12/2021 Goal Status: Met Ulcer/skin breakdown will have a volume reduction of 30% by week 4 Date Initiated: 05/13/2021 Date Inactivated: 06/16/2021 Target Resolution Date: 06/12/2021 Goal Status: Met Ulcer/skin breakdown will have a volume reduction of  50% by week 8 Date Initiated: 05/13/2021 Target Resolution Date: 07/13/2021 Goal Status: Active Ulcer/skin breakdown will have a volume reduction of 80% by week 12 Date Initiated: 05/13/2021 Target Resolution Date: 08/13/2021 Goal Status: Active Ulcer/skin breakdown will heal within 14 weeks Date Initiated: 05/13/2021 Target Resolution Date: 09/12/2021 Goal Status: Active Interventions: Assess patient/caregiver ability to obtain necessary supplies Assess patient/caregiver ability to perform ulcer/skin care regimen upon admission and as needed Assess ulceration(s) every visit Notes: Electronic Signature(s) Signed: 06/16/2021 4:28:40 PM By: Donnamarie Poag Entered By: Donnamarie Poag on 06/16/2021 14:37:13 Kau, [name] ([id number]) -------------------------------------------------------------------------------- Pain Assessment Details Patient Name: [name] Date of Service: 06/16/2021 2:00 PM Medical Record Number: [id number] Patient Account Number: [id number] Date of Birth/Sex: [birth year] (87 y.o. F) Treating RN: Donnamarie Poag Primary Care Jerron Niblack: Adrian Prows Other Clinician: Referring Redell Nazir: Adrian Prows Treating Lachanda Buczek/Extender: Skipper Cliche in Treatment: 4 Active Problems Location of Pain Severity and Description of Pain Patient Has Paino No Site Locations Rate the pain. Current Pain Level: 0 Pain Management and Medication Current Pain Management: Electronic Signature(s) Signed: 06/16/2021 4:28:40 PM By: Donnamarie Poag Entered By: Donnamarie Poag on 06/16/2021 14:30:23 Hunger, [name] ([id number]) -------------------------------------------------------------------------------- Patient/Caregiver Education Details Patient Name: [name] Date of Service: 06/16/2021 2:00 PM Medical Record Number: [id number] Patient Account Number: [id number] Date of Birth/Gender: [birth year] (87 y.o. F) Treating RN: Donnamarie Poag Primary Care Physician:  Adrian Prows Other Clinician: Referring Physician: Adrian Prows Treating Physician/Extender: Skipper Cliche in Treatment: 4 Education Assessment Education Provided To: Patient and Caregiver Education Topics Provided Basic Hygiene: Offloading: Wound/Skin Impairment: Electronic Signature(s) Signed: 06/16/2021 4:28:40 PM By: Donnamarie Poag Entered By: Donnamarie Poag on 06/16/2021 14:38:55 Savas, [name] ([id number]) -------------------------------------------------------------------------------- Wound Assessment Details Patient Name: [name] Date of Service: 06/16/2021 2:00 PM Medical Record Number: [id number] Patient Account Number: [id number] Date of Birth/Sex: [birth year] (87 y.o. F) Treating RN: Donnamarie Poag Primary Care Sebrina Kessner: Adrian Prows Other Clinician: Referring Chayanne Speir: Adrian Prows Treating Eddy Liszewski/Extender: Skipper Cliche in Treatment: 4 Wound Status Wound Number: 7 Primary Pressure Ulcer Etiology: Wound Location: Right, Lateral Malleolus Wound Open Wounding Event: Gradually Appeared Status: Date Acquired: 05/12/2021 Comorbid Cataracts, Congestive Heart Failure, Hypertension, Weeks Of Treatment: 4 History: Peripheral Arterial Disease, Osteoarthritis, Neuropathy Clustered Wound: No Photos Wound Measurements Length: (cm) 0.4 Width: (cm) 0.5 Depth: (cm) 0.2 Area: (cm) 0.157 Volume: (cm) 0.031 % Reduction in Area: 86% % Reduction in Volume: 86.2% Epithelialization: Small (1-33%) Tunneling: No Undermining: No Wound Description Classification: Category/Stage III Wound Margin: Distinct, outline attached Exudate Amount: Medium Exudate Type: Serous Exudate Color: amber Foul Odor After Cleansing: No Slough/Fibrino Yes Wound Bed Granulation Amount: Large (67-100%) Exposed Structure Granulation Quality: Red, Pink, Pale Fascia Exposed: No Necrotic Amount: Small (1-33%) Fat Layer (Subcutaneous Tissue) Exposed: Yes Necrotic  Quality: Adherent Slough Tendon Exposed: No Muscle Exposed: No Joint Exposed: No Bone Exposed: No  Treatment Notes Wound #7 (Malleolus) Wound Laterality: Right, Lateral Cleanser Soap and Water Discharge Instruction: Gently cleanse wound with antibacterial soap, rinse and pat dry prior to dressing wounds Peri-Wound Care [place], [name] ([id number]) Topical Primary Dressing Prisma 4.34 (in) Discharge Instruction: Moisten w/normal saline or sterile water; Cover wound as directed. Do not remove from wound bed. Secondary Dressing ABD Pad 5x9 (in/in) Discharge Instruction: Cover with ABD pad Secured With Compression Wrap Profore Lite LF 3 Multilayer Compression Bandaging System Discharge Instruction: Apply 3 multi-layer wrap as prescribed. Compression Stockings Add-Ons Electronic Signature(s) Signed: 06/16/2021 4:28:40 PM By: Donnamarie Poag Entered By: Donnamarie Poag on 06/16/2021 14:35:35 Victoria, [name] ([id number]) -------------------------------------------------------------------------------- [place] Details Patient Name: [name] Date of Service: 06/16/2021 2:00 PM Medical Record Number: [id number] Patient Account Number: [id number] Date of Birth/Sex: [birth year] (87 y.o. F) Treating RN: Donnamarie Poag Primary Care Chimene Salo: Adrian Prows Other Clinician: Referring Jackalyn Haith: Adrian Prows Treating Jaray Boliver/Extender: Skipper Cliche in Treatment: 4 Vital Signs Time Taken: 14:32 Temperature (F): 98.2 Height (in): 62 Pulse (bpm): 85 Weight (lbs): 150 Respiratory Rate (breaths/min): 18 Body Mass Index (BMI): 27.4 Blood Pressure (mmHg): 119/59 Reference Range: 80 - 120 mg / dl Electronic Signature(s) Signed: 06/16/2021 4:28:40 PM By: Donnamarie Poag Entered ByDonnamarie Poag on 06/16/2021 14:29:40

## 2021-06-17 NOTE — Progress Notes (Signed)
KETA, VANVALKENBURGH ([id number]) Visit Report for 06/16/2021 Chief Complaint Document Details Patient Name: [name], [name]. Date of Service: 06/16/2021 2:00 PM Medical Record Number: [id number] Patient Account Number: [id number] Date of Birth/Sex: 12-01-1923 (87 y.o. F) Treating RN: Dolan Amen Primary Care Provider: Adrian Prows Other Clinician: Referring Provider: Adrian Prows Treating Provider/Extender: Skipper Cliche in Treatment: 4 Information Obtained from: Patient Chief Complaint Right foot ulcer Electronic Signature(s) Signed: 06/16/2021 2:40:52 PM By: Worthy Keeler PA-C Entered By: Worthy Keeler on 06/16/2021 14:40:52 [name], [name] ([id number]) -------------------------------------------------------------------------------- Debridement Details Patient Name: [name] Date of Service: 06/16/2021 2:00 PM Medical Record Number: [id number] Patient Account Number: [id number] Date of Birth/Sex: January 29, 1923 (87 y.o. F) Treating RN: Donnamarie Poag Primary Care Provider: Adrian Prows Other Clinician: Referring Provider: Adrian Prows Treating Provider/Extender: Skipper Cliche in Treatment: 4 Debridement Performed for Wound #7 Right,Lateral Malleolus Assessment: Performed By: Physician Tommie Sams., PA-C Debridement Type: Debridement Level of Consciousness (Pre- Awake and Alert procedure): Pre-procedure Verification/Time Out Yes - 14:48 Taken: Start Time: 14:48 Total Area Debrided (L x W): 0.4 (cm) x 0.5 (cm) = 0.2 (cm) Tissue and other material Viable, Non-Viable, Callus, Slough, Subcutaneous, Biofilm, Slough debrided: Level: Skin/Subcutaneous Tissue Debridement Description: Excisional Instrument: Curette Bleeding: Minimum Hemostasis Achieved: Pressure End Time: 14:50 Response to Treatment: Procedure was tolerated well Level of Consciousness (Post- Awake and Alert procedure): Post Debridement Measurements of Total  Wound Length: (cm) 0.4 Stage: Category/Stage III Width: (cm) 0.5 Depth: (cm) 0.2 Volume: (cm) 0.031 Character of Wound/Ulcer Post Debridement: Improved Post Procedure Diagnosis Same as Pre-procedure Electronic Signature(s) Signed: 06/16/2021 4:28:40 PM By: Donnamarie Poag Signed: 06/16/2021 5:40:29 PM By: Worthy Keeler PA-C Entered By: Donnamarie Poag on 06/16/2021 14:48:47 [name], [name] ([id number]) -------------------------------------------------------------------------------- HPI Details Patient Name: [name] Date of Service: 06/16/2021 2:00 PM Medical Record Number: [id number] Patient Account Number: [id number] Date of Birth/Sex: [birth year] (87 y.o. 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 Dr. Amalia Hailey the podiatrist saw her on 07/20/2017 and sent her to the wound center for possible hyperbaric oxygen therapy. past medical history of peripheral vascular disease, varicose veins, status post appendectomy, basal cell carcinoma excision from the left leg, cholecystectomy, pacemaker placement, right lower extremity angiography done by Dr. dew in March 2017 with placement of a stent. there is also note of a successful ablation of the right small saphenous vein done which was reviewed by ultrasound on 10/24/2016. the patient had a right small saphenous vein ablation done on 10/20/2016. The patient has never been a smoker. She has been seen by Dr. Corene Cornea dew the vascular surgeon who most recently saw her on 06/15/2017 for evaluation of ongoing problems with right leg swelling. She had a lower  extremity arterial duplex examination done(02/13/17) which showed patent distal right superficial femoral artery stent and above-the-knee popliteal stent without evidence of restenosis. The ABI was more than 1.3 on the right and more than 1.3 on the left. This was consistent with noncompressible arteries due to medial calcification. The right great toe pressure and PPG waveforms are within normal limits and the left great toe pressure and PPG waveforms are decreased. he recommended she continue to wear her compression stockings  and continue with elevation. She is scheduled to have a noninvasive arterial study in the near future 08/16/2017 -- had a lower extremity arterial duplex examination done which showed patent distal right superficial femoral artery stent and above-the-knee popliteal stent without evidence of restenosis. The ABI was more than 1.3 on the right and more than 1.3 on the left. This was consistent with noncompressible arteries due to medial calcification. The right great toe pressure and PPG waveforms are within normal limits and the left great toe pressure and PPG waveforms are decreased. the x-ray of the right ankle has not yet been done 08/24/2017 -- had a right ankle x-ray -- IMPRESSION:1. No fracture, bone lesion or evidence of osteomyelitis. 2. Lateral soft tissue swelling with a soft tissue ulcer. she has not yet seen the vascular surgeon for review 08/31/17 on evaluation today patient's wound appears to be showing signs of improvement. She still with her appointment with vascular in order to review her results of her vascular study and then determine if any intervention would be recommended at that time. No fevers, chills, nausea, or vomiting noted at this time. She has been tolerating the dressing changes without complication. 09/28/17 on evaluation today patient's wound appears to show signs of good improvement in regard to the granulation tissue which is surfacing. There  is still a layer of slough covering the wound and the posterior portion is still significantly deeper than the anterior nonetheless there has been some good sign of things moving towards the better. She is going to go back to Dr. dew for reevaluation to ensure her blood flow is still appropriate. That will be before her next evaluation with [place] next week. No fevers, chills, nausea, or vomiting noted at this time. Patient does have some discomfort rated to be a 3-4/10 depending on activity specifically cleansing the wound makes it worse. 10/05/2017 -- the patient was seen by Dr. Lucky Cowboy last week and noninvasive studies showed a normal right ABI with brisk triphasic waveforms consistent with no arterial insufficiency including normal digital pressures. The duplex showed a patent distal right SFA stent and the proximal SFA was also normal. He was pleased with her test and thought she should have enough of perfusion for normal wound healing. He would see her back in 6 months time. 12/21/17 on evaluation today patient appears to be doing fairly well in regard to her right lateral ankle wound. Unfortunately the main issue that she is expansion at this point is that she is having some issues with what appears to be some cellulitis in the right anterior shin. She has also been noting a little bit of uncomfortable feeling especially last night and her ankle area. I'm afraid that she made the developing a little bit of an infection. With that being said I think it is in the early stages. 12/28/17 on evaluation today patient's ankle appears to be doing excellent. She's making good progress at this point the cellulitis seems to have improved after last week's evaluation. Overall she is having no significant discomfort which is excellent news. She does have an appointment with Dr. dew on March 29, 2018 for reevaluation in regard to the stent he placed. She seems to have excellent blood flow in the right lower  extremity. 01/19/12 on evaluation today patient's wound appears to be doing very well. In fact she does not appear to require debridement at this point, there's no evidence of infection, and overall from the standpoint of the wound she seems to be  doing very well. With that being said I believe that it may be time to switch to different dressing away from the [facility] Dressing she tells me she does have a lot going on her friend actually passed away yesterday and she's also having a lot of issues with her husband this obviously is weighing heavy on her as far as your thoughts and concerns today. 01/25/18 on evaluation today patient appears to be doing fairly well in regard to her right lateral malleolus. She has been tolerating the dressing changes without complication. Overall I feel like this is definitely showing signs of improvement as far as how the overall appearance of the wound is there's also evidence of epithelium start to migrate over the granulation tissue. In general I think that she is progressing nicely as far as the wound is concerned. The only concern she really has is whether or not we can switch to every other week visits in order to avoid having as many appointments as her daughters have a difficult time getting her to her appointments as well as the patient's husband to his he is not doing very well at this point. 02/22/18 on evaluation today patient's right lateral malleolus ulcer appears to be doing great. She has been tolerating the dressing changes without complication. Overall you making excellent progress at this time. Patient is having no significant discomfort. [name], [name] ([id number]) 03/15/18 on evaluation today patient appears to be doing much more poorly in regard to her right lateral ankle ulcer at this point. Unfortunately since have last seen her her husband has passed just a few days ago is obviously weighed heavily on her her daughter also had surgery  well she is with her today as usual. There does not appear to be any evidence of infection she does seem to have significant contusion/deep tissue injury to the right lateral malleolus which was not noted previous when I saw her last. It's hard to tell of exactly when this injury occurred although during the time she was spending the night in the hospital this may have been most likely. 03/22/18 on evaluation today patient appears to actually be doing very well in regard to her ulcer. She did unfortunately have a setback which was noted last week however the good news is we seem to be getting back on track and in fact the wound in the core did still have some necrotic tissue which will be addressed at this point today but in general I'm seeing signs that things are on the up and up. She is glad to hear this obviously she's been somewhat concerned that due to the how her wound digressed more recently. 03/29/18 on evaluation today patient appears to be doing fairly well in regard to her right lower extremity lateral malleolus ulcer. She unfortunately does have a new area of pressure injury over the inferior portion where the wound has opened up a little bit larger secondary to the pressure she seems to be getting. She does tell me sometimes when she sleeps at night that it actually hurts and does seem to be pushing on the area little bit more unfortunately. There does not appear to be any evidence of infection which is good news. She has been tolerating the dressing changes without complication. She also did have some bruising in the left second and third toes due to the fact that she may have bump this or injured it although she has neuropathy so she does not feel she did move  recently that may have been where this came from. Nonetheless there does not appear to be any evidence of infection at this time. 04/12/18 on evaluation today patient's wound on the right lateral ankle actually appears to be doing a  little bit better with a lot of necrotic docking tissue centrally loosening up in clearing away. However she does have the beginnings of a deep tissue injury on the left lateral malleolus likely due to the fact we've been trying offload the right as much as we have. I think she may benefit from an assistive soft device to help with offloading and it looks like they're looking at one of the doughnut conditions that wraps around the lower leg to offload which I think will definitely do a good job. With that being said I think we definitely need to address this issue on the left before it becomes a wound. Patient is not having significant pain. 04/19/18 on evaluation today patient appears to be doing excellent in regard to the progress she's made with her right lateral ankle ulcer. The left ankle region which did show evidence of a deep tissue injury seems to be resolving there's little fluid noted underneath and a blister there's nothing open at this point in time overall I feel like this is progressing nicely which is good news. She does not seem to be having significant discomfort at this point which is also good news. 04/25/18-She is here in follow up evaluation for bilateral lateral malleolar ulcers. The right lateral malleolus ulcer with pale subcutaneous tissue exposure, central area of ulcer with tendon/periosteum exposed. The left lateral malleolus ulcer now with central area of nonviable tissue, otherwise deep tissue injury. She is wearing compression wraps to the left lower extremity, she will place the right lower extremity compression wraps on when she gets home. She will be out of town over the weekend and return next week and follow-up appointment. She completed her doxycycline this morning 05/03/18 on evaluation today patient appears to be doing very well in regard to her right lateral ankle ulcer in general. At least she's showing some signs of improvement in this regard. Unfortunately she  has some additional injury to the left lateral malleolus region which appears to be new likely even over the past several days. Again this determination is based on the overall appearance. With that being said the patient is obviously frustrated about this currently. 05/10/18-She is here in follow-up evaluation for bilateral lateral malleolar ulcers. She states she has purchased offloading shoes/boots and they will arrive tomorrow. She was asked to bring them in the office at next week's appointment so her provider is aware of product being utilized. She continues to sleep on right or left side, she has been encouraged to sleep on her back. The right lateral malleolus ulcer is precariously close to peri-osteum; will order xray. The left lateral malleolus ulcer is improved. Will switch back to santyl; she will follow up next week. 05/17/18 on evaluation today patient actually appears to be doing very well in regard to her malleolus her ulcers compared to last time I saw them. She does not seem to have as much in the way of contusion at this point which is great news. With that being said she does continue to have discomfort and I do believe that she is still continuing to benefit from the offloading/pressure reducing boots that were recommended. I think this is the key to trying to get this to heal up completely. 05/24/18 on evaluation  today patient actually appears to be doing worse at this point in time unfortunately compared to her last week's evaluation. She is having really no increased pain which is good news unfortunately she does have more maceration in your theme and noted surrounding the right lateral ankle the left lateral ankle is not really is erythematous I do not see signs of the overt cellulitis on that side. Unfortunately the wounds do not seem to have shown any signs of improvement since the last evaluation. She also has significant swelling especially on the right compared to previous  some of this may be due to infection however also think that she may be served better while she has these wounds by compression wrapping versus continuing to use the Juxta-Lite for the time being. Especially with the amount of drainage that she is experiencing at this point. No fevers, chills, nausea, or vomiting noted at this time. 05/31/18 on evaluation today patient appears to actually be doing better in regard to her right lateral lower extremity ulcer specifically on the malleolus region. She has been tolerating the antibiotic without complication. With that being said she still continues to have issues but a little bit of redness although nothing like she what she was experiencing previous. She still continues to pressure to her ankle area she did get the problem on offloading boots unfortunately she will not wear them she states there too uncomfortable and she can't get in and out of the bed. Nonetheless at this point her wounds seem to be continually getting worse which is not what we want I'm getting somewhat concerned about her progress and how things are going to proceed if we do not intervene in some way shape or form. I therefore had a very lengthy conversation today about offloading yet again and even made a specific suggestion for switching her to a memory foam mattress and even gave the information for a specific one that they could look at getting if it was something that they were interested in considering. She does not want to be considered for a hospital bed air mattress although honestly insurance would not cover it that she does not have any wounds on her trunk. 06/14/18 on evaluation today both wounds over the bilateral lateral malleolus her ulcers appear to be doing better there's no evidence of pressure injury at this point. She did get the foam mattress for her bed and this does seem to have been extremely beneficial for her in my pinion. Her daughter states that she is having  difficulty getting out of bed because of how soft it is. The patient also relates this to be. Nonetheless I do feel like she's actually doing better. Unfortunately right after and around the time she was getting the mattress she also sustained a fall when she got up to go pick up the phone and ended up injuring her right elbow she has 18 sutures in place. We are not caring for this currently although home health is going to be taking the sutures out shortly. Nonetheless this may be something that we need to evaluate going forward. It depends on how well it has or has not healed in the end. She also recently saw an orthopedic specialist for an injection in the right shoulder just before her fall unfortunately the fall seems to have worsened her pain. 06/21/18 on evaluation today patient appears to be doing about the same in regard to her lateral malleolus ulcers. Both appear to be just a little bit  deeper but again we are clinging away the necrotic and dead tissue which I think is why this is progressing towards a deeper realm as opposed LASYA, VETTER ([id number]) to improving from my measurement standpoint in that regard. Nonetheless she has been tolerating the dressing changes she absolutely hates the memory foam mattress topper that was obtained for her nonetheless I do believe this is still doing excellent as far as taking care of excess pressure in regard to the lateral malleolus regions. She in fact has no pressure injury that I see whereas in weeks past it was week by week I was constantly seeing new pressure injuries. Overall I think it has been very beneficial for her. 07/03/18; patient arrives in my clinic today. She has deep punched out areas over her bilateral lateral malleoli. The area on the right has some more depth. We spent a lot of time today talking about pressure relief for these areas. This started when her daughter asked for a prescription for a memory foam mattress. I have  never written a prescription for a mattress and I don't think insurances would pay for that on an ordinary bed. In any case he came up that she has foam boots that she refuses to wear. I would suggest going to these before any other offloading issues when she is in bed. They say she is meticulous about offloading this the rest of the day 07/10/18- She is seen in follow-up evaluation for bilateral, lateral malleolus ulcers. There is no improvement in the ulcers. She has purchased and is sleeping on a memory foam mattress/overlay, she has been using the offloading boots nightly over the past week. She has a follow up appointment with vascular medicine at the end of October, in my opinion this follow up should be expedited given her deterioration and suboptimal TBI results. We will order plain film xray of the left ankle as deeper structures are palpable; would consider having MRI, regardless of xray report(s). The ulcers will be treated with iodoflex/iodosorb, she is unable to safely change the dressings daily with santyl. 07/19/18 on evaluation today patient appears to be doing in general visually well in regard to her bilateral lateral malleolus ulcers. She has been tolerating the dressing changes without complication which is good news. With that being said we did have an x-ray performed on 07/12/18 which revealed a slight loosen see in the lateral portion of the distal left fibula which may represent artifact but underline lytic destruction or osteomyelitis could not be excluded. MRI was recommended. With that being said we can see about getting the patient scheduled for an MRI to further evaluate this area. In fact we have that scheduled currently for August 20 19,019. 07/26/18 on evaluation today patient's wound on the right lateral ankle actually appears to be doing fairly well at this point in my pinion. She has made some good progress currently. With that being said unfortunately in regard to the left  lateral ankle ulcer this seems to be a little bit more problematic at this time. In fact as I further evaluated the situation she actually had bone exposed which is the first time that's been the case in the bone appear to be necrotic. Currently I did review patient's note from Dr. Bunnie Domino office with [facility]. He stated that ABI was 1.26 on the right and 0.95 on the left with good waveforms. Her perfusion is stable not reduced from previous studies and her digital waveforms were pretty good  particularly on the right. His conclusion upon review of the note was that there was not much she could do to improve her perfusion and he felt she was adequate for wound healing. His suggestion was that she continued to see [place] and consider a synthetic skin graft if there was no underlying infection. He plans to see her back in six months or as needed. 08/01/18 on evaluation today patient appears to be doing better in regard to her right lateral ankle ulcer. Her left lateral ankle ulcer is about the same she still has bone involvement in evidence of necrosis. There does not appear to be evidence of infection at this time On the right lateral lower extremity. I have started her on the Augmentin she picked this up and started this yesterday. This is to get her through until she sees infectious disease which is scheduled for 08/12/18. 08/06/18 on evaluation today patient appears to be doing rather well considering my discussion with patient's daughter at the end of last week. The area which was marked where she had erythema seems to be improved and this is good news. With that being said overall the patient seems to be making good improvement when it comes to the overall appearance of the right lateral ankle ulcer although this has been slow she at least is coming around in this regard. Unfortunately in regard to the left lateral ankle ulcer this is osteomyelitis based on the pathology report as  well is bone culture. Nonetheless we are still waiting CT scan. Unfortunately the MRI we originally ordered cannot be performed as the patient is a pacemaker which I had overlooked. Nonetheless we are working on the CT scan approval and scheduling as of now. She did go to the hospital over the weekend and was placed on IV Cefzo for a couple of days. Fortunately this seems to have improved the erythema quite significantly which is good news. There does not appear to be any evidence of worsening infection at this time. She did have some bleeding after the last debridement therefore I did not perform any sharp debridement in regard to left lateral ankle at this point. Patient has been approved for a snap vac for the right lateral ankle. 08/14/18; the patient with wounds over her bilateral lateral malleoli. The area on the right actually looks quite good. Been using a snap back on this area. Healthy granulation and appears to be filling in. Unfortunately the area on the left is really problematic. 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 Also noted to have cellulitis. She saw Dr. Novella Olive of infectious disease today and was put on linezolid. We are able to verify this with her pharmacy. She is completed the Augmentin that she was already on. We've been using Iodoflex to this area 08/23/18 on evaluation today patient's wounds both actually appear to be doing better compared to my prior evaluations. Fortunately she showing signs of good improvement in regard to the overall wound status especially where were using the snap vac on the right. In regard to left lateral malleolus the wound bed actually appears to be much cleaner than previously noted. I do not feel any phone directly probed during evaluation today and though there is tendon noted this does not appear to be necrotic it's actually fairly good as far as the overall  appearance of the tendon is concerned. In general the wound bed actually appears to be  doing significantly better than it was previous. Patient is currently in the care of [name] and I did review that note today. He actually has her on two weeks of linezolid and then following the patient will be on 1-2 months of Keflex. That is the plan currently. She has been on antibiotics therapy as prescribed by myself initially starting on July 30, 2018 and has been on that continuously up to this point. 08/30/18 on evaluation today patient actually appears to be doing much better in regard to her right lateral malleolus ulcer. She has been tolerating the dressing changes specifically the snap vac without complication although she did have some issues with the seal currently. Apparently there was some trouble with getting it to maintain over the past week past Sunday. Nonetheless overall the wound appears better in regard to the right lateral malleolus region. In regard to left lateral malleolus this actually show some signs of additional granulation although there still tendon noted in the base of the wound this appears to be healthy not necrotic in any way whatsoever. We are considering potentially using a snap vac for the left lateral malleolus as well the product wrap from [employer], [place], was present in the clinic today we're going to see this patient I did have her come in with me after obtaining consent from the patient and her daughter in order to look at the wound and see if there's any recommendation one way or another as to whether or not they felt the snapback could be beneficial for the left lateral malleolus region. But the conclusion was that it might be but that this is definitely a little bit deeper wound than what traditionally would be utilized for a snap vac. 09/06/18 on evaluation today patient actually appears to be doing excellent in my pinion in regard to both ankle ulcers. She has been  tolerating the dressing changes without complication which is great news. Specifically we have been using the snap vac. In regard to the right ankle I'm not even sure that this is going to be necessary for today and following as the wound has filled in quite nicely. In regard to the left ankle I do believe [name], [name] ([id number]) that we're seeing excellent epithelialization from the edge as well as granulation in the central portion the tendon is still exposed but there's no evidence of necrotic bone and in general I feel like the patient has made excellent progress even compared to last week with just one week of the snap vac. 09/11/18; this is a patient who has wounds on her bilateral lateral malleoli. Initially both of these were deep stage IV wounds in the setting of chronic arterial insufficiency. She has been revascularized. As I understand think she been using snap vacs to both of these wounds however the area on the right became more superficial and currently she is only using it on the left. Using silver collagen on the right and silver collagen under the back on the left I believe 09/19/18 on evaluation today patient actually appears to be doing very well in regard to her lateral malleolus or ulcers bilaterally. She has been tolerating the dressing changes without complication. Fortunately there does not appear to be any evidence of infection at this time. Overall I feel like she is improving in an excellent manner and I'm very pleased with the fact that everything seems to be turning towards the better for her. This has obviously been a long road. 09/27/18 on evaluation  today patient actually appears to be doing very well in regard to her bilateral lateral malleolus ulcers. She has been tolerating the dressing changes without complication. Fortunately there does not appear to be any evidence of infection at this time which is also great news. No fevers, chills, nausea, or vomiting  noted at this time. Overall I feel like she is doing excellent with the snap vac on the left malleolus. She had 40 mL of fluid collection over the past week. 10/04/18 on evaluation today patient actually appears to be doing well in regard to her bilateral lateral malleolus ulcers. She continues to tolerate the dressing changes without complication. One issue that I see is the snap vac on the left lateral malleolus which appears to have sealed off some fluid underlying this area and has not really allowed it to heal to the degree that I would like to see. For that reason I did suggest at this point we may want to pack a small piece of packing strip into this region to allow it to more effectively wick out fluid. 10/11/18 in general the patient today does not feel that she has been doing very well. She's been a little bit lethargic and subsequently is having bodyaches as well according to what she tells me today. With that being said overall she has been concerned with the fact that something may be worsening although to be honest her wounds really have not been appearing poorly. She does have a new ulcer on her left heel unfortunately. This may be pressure related. Nonetheless it seems to me to have potentially started at least as a blister I do not see any evidence of deep tissue injury. In regard to the left ankle the snap vac still seems to be causing the ceiling off of the deeper part of the wound which is in turn trapping fluid. I'm not extremely pleased with the overall appearance as far as progress from last week to this week therefore I'm gonna discontinue the snap vac at this point. 10/18/18 patient unfortunately this point has not been feeling well for the past several days. She was seen by Grayland Ormond her primary care provider who is a Librarian, academic at [facility]. Subsequently she states that she's been very weak and generally feeling malaise. No fevers, chills, nausea, or  vomiting noted at this time. With that being said bloodwork was performed at the PCP office on the 11th of this month which showed a white blood cell count of 10.7. This was repeated today and shows a white blood cell count of 12.4. This does show signs of worsening. Coupled with the fact that she is feeling worse and that her left ankle wound is not really showing signs of improvement I feel like this is an indication that the osteomyelitis is likely exacerbating not improving. Overall I think we may also want to check her C-reactive protein and sedimentation rate. Actually did call [facility] office this afternoon while the patient was in the office here with me. Subsequently based on the findings we discussed treatment possibilities and I think that it is appropriate for [place] to go ahead and initiate treatment with doxycycline which I'm going to do. Subsequently he did agree to see about adding a CRP and sedimentation rate to her orders. If that has not already been drawn to where they can run it they will contact the patient she can come back to have that check. They are in agreement with plan as far  as the patient and her daughter are concerned. Nonetheless also think we need to get in touch with Dr. Henreitta Leber office to see about getting the patient scheduled with him as soon as possible. 11/08/18 on evaluation today patient presents for follow-up concerning her bilateral foot and ankle ulcers. I did do an extensive review of her chart in epic today. Subsequently she was seen by [name] he did initiate Cefepime IV antibiotic therapy. Subsequently she had some issues with her PICC line this had to be removed because it was coiled and then replaced. Fortunately that was now settled. Unfortunately she has continued have issues with her left heel as well as the issues that she is experiencing with her bilateral lateral malleolus regions. I do believe however both areas seem to be doing a little bit  better on evaluation today which is good news. No fevers, chills, nausea, or vomiting noted at this time. She actually has an angiogram schedule with Dr. dew on this coming Monday, November 11, 2018. Subsequently the patient states that she is feeling much better especially than what she was roughly 2 weeks ago. She actually had to cancel an appointment because she was feeling so poorly. No fevers, chills, nausea, or vomiting noted at this time. 11/15/18 on evaluation today patient actually is status post having had her angiogram with Dr. dew Monday, four days ago. 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 He had to go and work on several areas of the vasculature fortunately he was able to obtain no more than a 30% residual stenosis throughout post procedure. I reviewed this note today. I think this will definitely help with healing at this time. Fortunately there does not appear to be any signs of infection and I do feel like ratio already has a better appearance to it. 11/22/18 upon evaluation today patient actually appears to be doing very well in regard to her wounds in general. The right lateral malleolus looks excellent the heel looks better in the left lateral malleolus also appears to be doing a little better. With that being said the right second toe actually appears to be open and training we been watching this is been dry and stable but now is open. 12/03/2018 Seen today for follow-up and management of multiple bilateral lower extremity wounds. New pressure injury of the great toe which is closed at this time. Wound of the right distal second toe appears larger today with deep undermining and a pocket of fluid present within the undermining region. Left and right malleolus is wounds are stable today with no signs and symptoms of infection.Denies any needs or concerns during exam today. 12/13/18 on evaluation today patient appears to be doing somewhat better in  regard to her left heel ulcer. She also seems to be completely healed in regard to the right lateral malleolus ulcer. The left malleolus ulcer is smaller what unfortunately the wounds which are new over the first and second toes of the right foot are what are most concerning at this point especially the second. Both areas did require sharp debridement today. 12/20/18 on evaluation today patient's wound actually appears to be doing better in regard to left lateral ankle and her right lateral ankle continues to remain healed. The hill ulcer on the left is improved. She does have improvement noted as well in regard to both toe ulcers. Overall I'm very pleased in this regard. No fevers, chills, nausea, or vomiting noted at this time.  12/23/18 on evaluation today patient is seen after she had her toenails trimmed at the podiatrist office due to issues with her right great toe. There was what appeared to be dark eschar on the surface of the wound which had her in the podiatrist concerned. Nonetheless as I remember that during the last office visit I had utilize silver nitrate of this area I was much less concerned about the situation. Subsequently I was able to clean off much of this tissue without any complication today. This does not appear to show any signs of infection and actually look somewhat better Ramroop, [name] ([id number]) compared to last time post debridement. Her second toe on the right foot actually had callous over and there did appear still be some fluid underneath this that would require debridement today. 12/27/18 on evaluation today patient actually appears to be showing signs of improvement at all locations. Even the left lateral ankle although this is not quite as great as the other sites. Fortunately there does not appear to be any signs of infection at this time and both of her toes on the right foot seem to be showing signs of improvement which is good news and very pleased in this  regard. 01/03/19 on evaluation today patient appears to be doing better for the most part in regard to her wounds in particular. There does not appear to be any evidence of infection at this time which is good news. Fortunately there is no sign of really worsening anywhere except for the right great toe which she does have what appears to be a bruise/deep tissue injury which is very superficial and already resolving. I'm not sure where this came from I questioned her extensively and she does not recall what may have happened with this. Other than that the patient seems to be doing well even the left lateral ankle ulcer looks good and is getting smaller. 01/10/19 on evaluation today patient appears to be doing well in regard to her left heel wound and both of her toe wounds. Overall I feel like there is definitely improvement here and I'm happy in that regard. With that being said unfortunately she is having issues with the left lateral malleolus ulcer which unfortunately still has a lot of depth to it. This is gonna be a very difficult wound for [place] to be able to truly get to heal. I may want to consider some type of skin substitute to see if this would be of benefit for her. I'll discuss this with her more the next visit most likely. This was something I thought about more at the end of the visit when I was Artie out of the room and the patient had been discharged. 01/17/19 on evaluation today patient appears to be doing very well in regard to her wounds in general. She's been making excellent progress at this time. Fortunately there's no sign of infection at this time either. No fevers, chills, nausea, or vomiting noted at this time. The biggest issue is still her left lateral malleolus where it appears to be doing well and is getting smaller but still shows a small corner where this is deeper and goes down into what appears to be the joint space. Nonetheless this is taking much longer to heal although it  still looks better in smaller than previous evaluations. 01/24/19 on evaluation today patient's wounds actually appear to be doing rather well in general overall. She did require some sharp debridement in regard to the right great  toe but everything else appears to be doing excellent no debridement was even necessary. No fevers, chills, nausea, or vomiting noted at this time. 01/31/19 on evaluation today patient actually appears to be doing much better in regard to her left foot wound on the heel as well as the ankle. The right great toe appears to be a little bit worse today this had callous over and trapped a lot of fluid underneath. Fortunately there's no signs of infection at any site which is great news. 02/07/19 on evaluation today patient actually appears to be doing decently well in regard to all of her ulcers at this point. No sharp debridement was required she is a little bit of hyper granulation in regard to the left lateral ankle as well as the left heel but the hill itself is almost completely healed which is excellent news. Overall been very pleased in this regard. 02/14/19 on evaluation today patient actually appears to be doing very well in regard to her ulcers on the right first toe, left lateral malleolus, and left heel. In fact the heel is almost completely healed at this point. The patient does not show any signs of infection which is good news. Overall very pleased with how things have progressed. 04/18/19 Telehealth Evaluation During the COVID-19 National Emergency: Verbal Consent: Obtained from patient Allergies: reviewed and the active list is current. Medication changes: patient has no current medication changes. COVID-19 Screening: 1. Have you traveled internationally or on a cruise ship in the last 14 dayso No 2. Have you had contact with someone with or under investigation for COVID-19o No 3. Have you had a fever, cough, sore throat, or experiencing shortness of breatho  No on evaluation today actually did have a visit with this patient through a telehealth encounter with her home health nurse. Subsequently it was noted that the patient actually appears to be doing okay in regard to her wounds both the right great toe as well as the left lateral malleolus have shown signs of improvement although this in your theme around the left lateral malleolus there eschar coverings for both locations. The question is whether or not they are actually close and whether or not home health needs to discharge the patient or not. Nonetheless my concern is this point obviously is that without actually seeing her and being able to evaluate this directly I cannot ensure that she is completely healed which is the question that I'm being asked. 04/22/19 on evaluation today patient presents for her first evaluation since last time I saw her which was actually February 14, 2019. I did do a telehealth visit last week in which point it was questionable whether or not she may be healed and had to bring her in today for confirmation. With that being said she does seem to be doing quite well at this point which is good news. There does not appear to be any drainage in the deed I believe her wounds may be healed. Readmission: 09/04/2019 on evaluation today patient appears to be doing unfortunately somewhat more poorly in regard to her left foot ulcer secondary to a wound that began on 08/21/2019 at least when she first noticed this. Fortunately she has not had any evidence of active infection at this time. Systemically. I also do not necessarily see any evidence of infection at the blister/wound site on the first metatarsal head plantar aspect. This almost appears to be something that may have just rubbed inappropriately causing this to breakdown. They did not  want a wait too long to come in to be seen as again she had significant issues in the past with wounds that took quite a while to heal in fact it  was close to 2 years. Nonetheless this does not appear to be quite that bad but again we do need to remove some of the necrotic tissue from the surface of the wound to tell exactly the extent. She does not appear to have any significant arterial disease at this point and again her last ABIs and TBI's are recorded above in the alert section her left ABI was 1.27 with a TBI of 0.72 to the right ABI 1.08 with a TBI of 0.39. Other than this the patient has been doing quite well since I last saw her and that was in May 2020. 09/11/2019 on evaluation today patient appeared to be doing very well with regard to her plantar foot ulcer on the left. In fact this appears to be almost completely healed which is awesome. That is after just 1 week of intervention. With that being said there is no signs of active infection at this time. 09/18/2019 on evaluation today patient actually appears to be doing excellent in fact she is completely healed based on what I am seeing at this ARLETTE, SCHAAD. ([id number]) point. Fortunately there is no signs of active infection at this time and overall patient is very pleased to hear that this area has healed so quickly. Readmission: 05/13/2021 upon evaluation today this patient presents for reevaluation here in the clinic. This is a wound that actually we previously took care of. She had 1 on the right ankle and the left the left turned out to be be harder due to to heal but nonetheless is doing great at this point as the right that has reopened and it was noted first just several weeks ago with a scab over it and came off in just the past few days. Fortunately there does not appear to be any obvious evidence of significant active infection at this time which is great news. No fevers, chills, nausea, vomiting, or diarrhea. 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 There does not appear to be any signs of this interfering in any  way with her wound. She does have swelling we previously had compression socks for her ordered but again it does not look like she wears these on a regular basis by any means. 05/26/2021 upon evaluation today patient appears to be doing well with regard to her wound which is actually showing signs of excellent improvement. There does not appear to be any signs of active infection which is great news and overall very pleased with where things stand today. No fevers, chills, nausea, vomiting, or diarrhea. 06/02/2021 upon evaluation today patient's wound actually showing signs of excellent improvement. Fortunately there does not appear to be any signs of active infection which is great news. I think the patient is making good progress with regard to her wounds in general. 06/09/2021 upon evaluation today patient appears to be doing excellent in regard to her wounds currently. Fortunately there does not appear to be any signs of active infection which is great news. No fevers, chills, nausea, vomiting, or diarrhea. Overall extremely pleased with where things stand today. I think the patient is making excellent progress. 06/16/2021 upon evaluation today patient appears to be doing well in regard to her wound. This is going require little bit of debridement  today and that was discussed with the patient. Otherwise she seems to be doing quite well and I am actually very pleased with where things stand at this point. No fevers, chills, nausea, vomiting, or diarrhea. Electronic Signature(s) Signed: 06/16/2021 5:22:18 PM By: Worthy Keeler PA-C Entered By: Worthy Keeler on 06/16/2021 17:22:18 [name], [name] ([id number]) -------------------------------------------------------------------------------- Physical Exam Details Patient Name: [name] Date of Service: 06/16/2021 2:00 PM Medical Record Number: [id number] Patient Account Number: [id number] Date of Birth/Sex: May 11, 1923 (87 y.o.  F) Treating RN: Dolan Amen Primary Care Provider: Adrian Prows Other Clinician: Referring Provider: Adrian Prows Treating Provider/Extender: Skipper Cliche in Treatment: 4 Constitutional Well-nourished and well-hydrated in no acute distress. Respiratory normal breathing without difficulty. Psychiatric this patient is able to make decisions and demonstrates good insight into disease process. Alert and Oriented x 3. pleasant and cooperative. Notes Upon inspection patient's wound bed showed signs of good granulation epithelization at this point. There does not appear to be any signs of infection which is great and overall I am extremely pleased with where things stand. I do not see any evidence that this is worsening and in fact I feel like she is getting better. Electronic Signature(s) Signed: 06/16/2021 5:22:34 PM By: Worthy Keeler PA-C Entered By: Worthy Keeler on 06/16/2021 17:22:34 [name], [name] ([id number]) -------------------------------------------------------------------------------- Physician Orders Details Patient Name: [name] Date of Service: 06/16/2021 2:00 PM Medical Record Number: [id number] Patient Account Number: [id number] Date of Birth/Sex: 1923-07-18 (87 y.o. F) Treating RN: Donnamarie Poag Primary Care Provider: Adrian Prows Other Clinician: Referring Provider: Adrian Prows Treating Provider/Extender: Skipper Cliche in Treatment: 4 Verbal / Phone Orders: No Diagnosis Coding Follow-up Appointments o Return Appointment in 1 week. o Nurse Visit as needed Bathing/ Shower/ Hygiene o May shower with wound dressing protected with water repellent cover or cast protector. Edema Control - Lymphedema / Segmental Compressive Device / Other o Optional: One layer of unna paste to top of compression wrap (to act as an anchor). o Elevate, Exercise Daily and Avoid Standing for Long Periods of Time. o Elevate legs to the  level of the heart and pump ankles as often as possible o Elevate leg(s) parallel to the floor when sitting. Wound Treatment Wound #7 - Malleolus Wound Laterality: Right, Lateral Cleanser: Soap and Water 1 x Per Week/30 Days Discharge Instructions: Gently cleanse wound with antibacterial soap, rinse and pat dry prior to dressing wounds Primary Dressing: Prisma 4.34 (in) 1 x Per Week/30 Days Discharge Instructions: Moisten w/normal saline or sterile water; Cover wound as directed. Do not remove from wound bed. Secondary Dressing: ABD Pad 5x9 (in/in) 1 x Per Week/30 Days Discharge Instructions: Cover with ABD pad Compression Wrap: Profore Lite LF 3 Multilayer Compression Bandaging System 1 x Per Week/30 Days Discharge Instructions: Apply 3 multi-layer wrap as prescribed. Electronic Signature(s) Signed: 06/16/2021 4:28:40 PM By: Donnamarie Poag Signed: 06/16/2021 5:40:29 PM By: Worthy Keeler PA-C Entered By: Donnamarie Poag on 06/16/2021 14:49:14 [name], [name] ([id number]) -------------------------------------------------------------------------------- Progress Note Details Patient Name: [name] Date of Service: 06/16/2021 2:00 PM Medical Record Number: [id number] Patient Account Number: [id number] Date of Birth/Sex: [birth year] (87 y.o. 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 Dr. Amalia Hailey the podiatrist saw her on 07/20/2017 and sent her to the wound center for possible hyperbaric oxygen therapy. past medical history of peripheral  vascular disease, varicose veins, status post appendectomy, basal cell carcinoma excision from the left leg, cholecystectomy, pacemaker placement, right lower extremity angiography done by Dr. dew in March 2017 with placement of a stent. there is also note of a successful ablation of the right small saphenous vein done which was reviewed by ultrasound on 10/24/2016. the patient had a right small saphenous vein ablation done on 10/20/2016. The patient has never been a smoker. She has been seen by Dr. Corene Cornea dew the vascular surgeon who most recently saw her on 06/15/2017 for evaluation of ongoing problems with right leg swelling. She had a lower extremity arterial duplex examination done(02/13/17) which showed patent distal right superficial femoral artery stent and above-the-knee popliteal stent without evidence of restenosis. The ABI was more than 1.3 on the right and more than 1.3 on the left. This was consistent with noncompressible arteries due to medial calcification. The right great toe pressure and PPG waveforms are within normal limits and the left great toe pressure and PPG waveforms are decreased. he recommended she continue to wear her compression stockings and continue with elevation. She is scheduled to have a noninvasive arterial study in the near future 08/16/2017 -- had a lower extremity arterial duplex examination done which showed patent distal right superficial femoral artery stent and above-the-knee popliteal stent without evidence of restenosis. The ABI was more than 1.3 on the right and more than 1.3 on the left. This was consistent with noncompressible arteries due to medial calcification. The right great toe pressure and PPG waveforms are within normal limits and the left great toe pressure and PPG waveforms are decreased. the x-ray of the right ankle has not yet been done 08/24/2017 -- had a right ankle x-ray -- IMPRESSION:1. No fracture, bone lesion or evidence of  osteomyelitis. 2. Lateral soft tissue swelling with a soft tissue ulcer. she has not yet seen the vascular surgeon for review 08/31/17 on evaluation today patient's wound appears to be showing signs of improvement. She still with her appointment with vascular in order to review her results of her vascular study and then determine if any intervention would be recommended at that time. No fevers, chills, nausea, or vomiting noted at this time. She has been tolerating the dressing changes without complication. 09/28/17 on evaluation today patient's wound appears to show signs of good improvement in regard to the granulation tissue which is surfacing. There is still a layer of slough covering the wound and the posterior portion is still significantly deeper than the anterior nonetheless there has been some good sign of things moving towards the better. She is going to go back to Dr. dew for reevaluation to ensure her blood flow is still appropriate. That will be before her next evaluation with [place] next week. No fevers, chills, nausea, or vomiting noted at this time. Patient does have some discomfort rated to be a 3-4/10 depending on activity specifically cleansing the wound makes it worse. 10/05/2017 -- the patient was seen by Dr. Lucky Cowboy last week and noninvasive studies showed a normal right ABI with brisk triphasic waveforms consistent with no arterial insufficiency including normal digital pressures. The duplex showed a patent distal right SFA stent and the proximal SFA was also normal. He was pleased with her test and  thought she should have enough of perfusion for normal wound healing. He would see her back in 6 months time. 12/21/17 on evaluation today patient appears to be doing fairly well in regard to her right lateral ankle wound. Unfortunately the main issue that she is expansion at this point is that she is having some issues with what appears to be some cellulitis in the right anterior shin. She  has also been noting a little bit of uncomfortable feeling especially last night and her ankle area. I'm afraid that she made the developing a little bit of an infection. With that being said I think it is in the early stages. 12/28/17 on evaluation today patient's ankle appears to be doing excellent. She's making good progress at this point the cellulitis seems to have improved after last week's evaluation. Overall she is having no significant discomfort which is excellent news. She does have an appointment with Dr. dew on March 29, 2018 for reevaluation in regard to the stent he placed. She seems to have excellent blood flow in the right lower extremity. 01/19/12 on evaluation today patient's wound appears to be doing very well. In fact she does not appear to require debridement at this point, there's no evidence of infection, and overall from the standpoint of the wound she seems to be doing very well. With that being said I believe that it may be time to switch to different dressing away from the [facility] Dressing she tells me she does have a lot going on her friend actually passed away yesterday and she's also having a lot of issues with her husband this obviously is weighing heavy on her as far as your thoughts and concerns today. 01/25/18 on evaluation today patient appears to be doing fairly well in regard to her right lateral malleolus. She has been tolerating the dressing changes without complication. Overall I feel like this is definitely showing signs of improvement as far as how the overall appearance of the wound is there's also evidence of epithelium start to migrate over the granulation tissue. In general I think that she is progressing nicely as far as the wound is concerned. The only concern she really has is whether or not we can switch to every other week visits in order to avoid having as many [name], [name] ([id number]) appointments as her daughters have a difficult time  getting her to her appointments as well as the patient's husband to his he is not doing very well at this point. 02/22/18 on evaluation today patient's right lateral malleolus ulcer appears to be doing great. She has been tolerating the dressing changes without complication. Overall you making excellent progress at this time. Patient is having no significant discomfort. 03/15/18 on evaluation today patient appears to be doing much more poorly in regard to her right lateral ankle ulcer at this point. Unfortunately since have last seen her her husband has passed just a few days ago is obviously weighed heavily on her her daughter also had surgery well she is with her today as usual. There does not appear to be any evidence of infection she does seem to have significant contusion/deep tissue injury to the right lateral malleolus which was not noted previous when I saw her last. It's hard to tell of exactly when this injury occurred although during the time she was spending the night in the hospital this may have been most likely. 03/22/18 on evaluation today patient appears to actually be doing very well in regard  to her ulcer. She did unfortunately have a setback which was noted last week however the good news is we seem to be getting back on track and in fact the wound in the core did still have some necrotic tissue which will be addressed at this point today but in general I'm seeing signs that things are on the up and up. She is glad to hear this obviously she's been somewhat concerned that due to the how her wound digressed more recently. 03/29/18 on evaluation today patient appears to be doing fairly well in regard to her right lower extremity lateral malleolus ulcer. She unfortunately does have a new area of pressure injury over the inferior portion where the wound has opened up a little bit larger secondary to the pressure she seems to be getting. She does tell me sometimes when she sleeps at night  that it actually hurts and does seem to be pushing on the area little bit more unfortunately. There does not appear to be any evidence of infection which is good news. She has been tolerating the dressing changes without complication. She also did have some bruising in the left second and third toes due to the fact that she may have bump this or injured it although she has neuropathy so she does not feel she did move recently that may have been where this came from. Nonetheless there does not appear to be any evidence of infection at this time. 04/12/18 on evaluation today patient's wound on the right lateral ankle actually appears to be doing a little bit better with a lot of necrotic docking tissue centrally loosening up in clearing away. However she does have the beginnings of a deep tissue injury on the left lateral malleolus likely due to the fact we've been trying offload the right as much as we have. I think she may benefit from an assistive soft device to help with offloading and it looks like they're looking at one of the doughnut conditions that wraps around the lower leg to offload which I think will definitely do a good job. With that being said I think we definitely need to address this issue on the left before it becomes a wound. Patient is not having significant pain. 04/19/18 on evaluation today patient appears to be doing excellent in regard to the progress she's made with her right lateral ankle ulcer. The left ankle region which did show evidence of a deep tissue injury seems to be resolving there's little fluid noted underneath and a blister there's nothing open at this point in time overall I feel like this is progressing nicely which is good news. She does not seem to be having significant discomfort at this point which is also good news. 04/25/18-She is here in follow up evaluation for bilateral lateral malleolar ulcers. The right lateral malleolus ulcer with pale subcutaneous  tissue exposure, central area of ulcer with tendon/periosteum exposed. The left lateral malleolus ulcer now with central area of nonviable tissue, otherwise deep tissue injury. She is wearing compression wraps to the left lower extremity, she will place the right lower extremity compression wraps on when she gets home. She will be out of town over the weekend and return next week and follow-up appointment. She completed her doxycycline this morning 05/03/18 on evaluation today patient appears to be doing very well in regard to her right lateral ankle ulcer in general. At least she's showing some signs of improvement in this regard. Unfortunately she has some additional injury  to the left lateral malleolus region which appears to be new likely even over the past several days. Again this determination is based on the overall appearance. With that being said the patient is obviously frustrated about this currently. 05/10/18-She is here in follow-up evaluation for bilateral lateral malleolar ulcers. She states she has purchased offloading shoes/boots and they will arrive tomorrow. She was asked to bring them in the office at next week's appointment so her provider is aware of product being utilized. She continues to sleep on right or left side, she has been encouraged to sleep on her back. The right lateral malleolus ulcer is precariously close to peri-osteum; will order xray. The left lateral malleolus ulcer is improved. Will switch back to santyl; she will follow up next week. 05/17/18 on evaluation today patient actually appears to be doing very well in regard to her malleolus her ulcers compared to last time I saw them. She does not seem to have as much in the way of contusion at this point which is great news. With that being said she does continue to have discomfort and I do believe that she is still continuing to benefit from the offloading/pressure reducing boots that were recommended. I think this  is the key to trying to get this to heal up completely. 05/24/18 on evaluation today patient actually appears to be doing worse at this point in time unfortunately compared to her last week's evaluation. She is having really no increased pain which is good news unfortunately she does have more maceration in your theme and noted surrounding the right lateral ankle the left lateral ankle is not really is erythematous I do not see signs of the overt cellulitis on that side. Unfortunately the wounds do not seem to have shown any signs of improvement since the last evaluation. She also has significant swelling especially on the right compared to previous some of this may be due to infection however also think that she may be served better while she has these wounds by compression wrapping versus continuing to use the Juxta-Lite for the time being. Especially with the amount of drainage that she is experiencing at this point. No fevers, chills, nausea, or vomiting noted at this time. 05/31/18 on evaluation today patient appears to actually be doing better in regard to her right lateral lower extremity ulcer specifically on the malleolus region. She has been tolerating the antibiotic without complication. With that being said she still continues to have issues but a little bit of redness although nothing like she what she was experiencing previous. She still continues to pressure to her ankle area she did get the problem on offloading boots unfortunately she will not wear them she states there too uncomfortable and she can't get in and out of the bed. Nonetheless at this point her wounds seem to be continually getting worse which is not what we want I'm getting somewhat concerned about her progress and how things are going to proceed if we do not intervene in some way shape or form. I therefore had a very lengthy conversation today about offloading yet again and even made a specific suggestion for switching her  to a memory foam mattress and even gave the information for a specific one that they could look at getting if it was something that they were interested in considering. She does not want to be considered for a hospital bed air mattress although honestly insurance would not cover it that she does not have any wounds  on her trunk. 06/14/18 on evaluation today both wounds over the bilateral lateral malleolus her ulcers appear to be doing better there's no evidence of pressure injury at this point. She did get the foam mattress for her bed and this does seem to have been extremely beneficial for her in my pinion. Her daughter states that she is having difficulty getting out of bed because of how soft it is. The patient also relates this to be. Nonetheless I do feel like she's actually doing better. Unfortunately right after and around the time she was getting the mattress she also sustained a fall when she got up to go pick up the phone and ended up injuring her right elbow she has 18 sutures in place. We are not caring for this currently although home health is going to be taking the sutures out shortly. Nonetheless this may be something that we need to evaluate going forward. It depends on [name], COLBERG ([id number]) how well it has or has not healed in the end. She also recently saw an orthopedic specialist for an injection in the right shoulder just before her fall unfortunately the fall seems to have worsened her pain. 06/21/18 on evaluation today patient appears to be doing about the same in regard to her lateral malleolus ulcers. Both appear to be just a little bit deeper but again we are clinging away the necrotic and dead tissue which I think is why this is progressing towards a deeper realm as opposed to improving from my measurement standpoint in that regard. Nonetheless she has been tolerating the dressing changes she absolutely hates the memory foam mattress topper that was obtained for her  nonetheless I do believe this is still doing excellent as far as taking care of excess pressure in regard to the lateral malleolus regions. She in fact has no pressure injury that I see whereas in weeks past it was week by week I was constantly seeing new pressure injuries. Overall I think it has been very beneficial for her. 07/03/18; patient arrives in my clinic today. She has deep punched out areas over her bilateral lateral malleoli. The area on the right has some more depth. We spent a lot of time today talking about pressure relief for these areas. This started when her daughter asked for a prescription for a memory foam mattress. I have never written a prescription for a mattress and I don't think insurances would pay for that on an ordinary bed. In any case he came up that she has foam boots that she refuses to wear. I would suggest going to these before any other offloading issues when she is in bed. They say she is meticulous about offloading this the rest of the day 07/10/18- She is seen in follow-up evaluation for bilateral, lateral malleolus ulcers. There is no improvement in the ulcers. She has purchased and is sleeping on a memory foam mattress/overlay, she has been using the offloading boots nightly over the past week. She has a follow up appointment with vascular medicine at the end of October, in my opinion this follow up should be expedited given her deterioration and suboptimal TBI results. We will order plain film xray of the left ankle as deeper structures are palpable; would consider having MRI, regardless of xray report(s). The ulcers will be treated with iodoflex/iodosorb, she is unable to safely change the dressings daily with santyl. 07/19/18 on evaluation today patient appears to be doing in general visually well in regard to her bilateral  lateral malleolus ulcers. She has been tolerating the dressing changes without complication which is good news. With that being said we did  have an x-ray performed on 07/12/18 which revealed a slight loosen see in the lateral portion of the distal left fibula which may represent artifact but underline lytic destruction or osteomyelitis could not be excluded. MRI was recommended. With that being said we can see about getting the patient scheduled for an MRI to further evaluate this area. In fact we have that scheduled currently for August 20 19,019. 07/26/18 on evaluation today patient's wound on the right lateral ankle actually appears to be doing fairly well at this point in my pinion. She has made some good progress currently. With that being said unfortunately in regard to the left lateral ankle ulcer this seems to be a little bit more problematic at this time. In fact as I further evaluated the situation she actually had bone exposed which is the first time that's been the case in the bone appear to be necrotic. Currently I did review patient's note from Dr. Bunnie Domino office with [facility]. He stated that ABI was 1.26 on the right and 0.95 on the left with good waveforms. Her perfusion is stable not reduced from previous studies and her digital waveforms were pretty good particularly on the right. His conclusion upon review of the note was that there was not much she could do to improve her perfusion and he felt she was adequate for wound healing. His suggestion was that she continued to see [place] and consider a synthetic skin graft if there was no underlying infection. He plans to see her back in six months or as needed. 08/01/18 on evaluation today patient appears to be doing better in regard to her right lateral ankle ulcer. Her left lateral ankle ulcer is about the same she still has bone involvement in evidence of necrosis. There does not appear to be evidence of infection at this time On the right lateral lower extremity. I have started her on the Augmentin she picked this up and started this yesterday. This is to  get her through until she sees infectious disease which is scheduled for 08/12/18. 08/06/18 on evaluation today patient appears to be doing rather well considering my discussion with patient's daughter at the end of last week. The area which was marked where she had erythema seems to be improved and this is good news. With that being said overall the patient seems to be making good improvement when it comes to the overall appearance of the right lateral ankle ulcer although this has been slow she at least is coming around in this regard. Unfortunately in regard to the left lateral ankle ulcer this is osteomyelitis based on the pathology report as well is bone culture. Nonetheless we are still waiting CT scan. Unfortunately the MRI we originally ordered cannot be performed as the patient is a pacemaker which I had overlooked. Nonetheless we are working on the CT scan approval and scheduling as of now. She did go to the hospital over the weekend and was placed on IV Cefzo for a couple of days. Fortunately this seems to have improved the erythema quite significantly which is good news. There does not appear to be any evidence of worsening infection at this time. She did have some bleeding after the last debridement therefore I did not perform any sharp debridement in regard to left lateral ankle at this point. Patient has been approved for  a snap vac for the right lateral ankle. 08/14/18; the patient with wounds over her bilateral lateral malleoli. The area on the right actually looks quite good. Been using a snap back on this area. Healthy granulation and appears to be filling in. Unfortunately the area on the left is really problematic. 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 Also noted to have cellulitis. She saw Dr. Novella Olive of infectious disease today and was put on linezolid. We are able to verify this with her pharmacy.  She is completed the Augmentin that she was already on. We've been using Iodoflex to this area 08/23/18 on evaluation today patient's wounds both actually appear to be doing better compared to my prior evaluations. Fortunately she showing signs of good improvement in regard to the overall wound status especially where were using the snap vac on the right. In regard to left lateral malleolus the wound bed actually appears to be much cleaner than previously noted. I do not feel any phone directly probed during evaluation today and though there is tendon noted this does not appear to be necrotic it's actually fairly good as far as the overall appearance of the tendon is concerned. In general the wound bed actually appears to be doing significantly better than it was previous. Patient is currently in the care of [name] and I did review that note today. He actually has her on two weeks of linezolid and then following the patient will be on 1-2 months of Keflex. That is the plan currently. She has been on antibiotics therapy as prescribed by myself initially starting on July 30, 2018 and has been on that continuously up to this point. 08/30/18 on evaluation today patient actually appears to be doing much better in regard to her right lateral malleolus ulcer. She has been tolerating the dressing changes specifically the snap vac without complication although she did have some issues with the seal currently. Apparently there was some trouble with getting it to maintain over the past week past Sunday. Nonetheless overall the wound appears better in regard to the right lateral malleolus region. In regard to left lateral malleolus this actually show some signs of additional granulation although there still tendon noted in the base of the wound this appears to be healthy not necrotic in any way whatsoever. We are considering potentially using a snap vac for the left lateral malleolus as well the product wrap  from [employer], [place], was present in the clinic today we're going to see this patient I did have her come in with me after obtaining consent from the patient and her daughter in order to look at the wound and see if there's any recommendation one way or another as to whether or not they felt the snapback could be beneficial for the left lateral malleolus region. But [name], [name] ([id number]) the conclusion was that it might be but that this is definitely a little bit deeper wound than what traditionally would be utilized for a snap vac. 09/06/18 on evaluation today patient actually appears to be doing excellent in my pinion in regard to both ankle ulcers. She has been tolerating the dressing changes without complication which is great news. Specifically we have been using the snap vac. In regard to the right ankle I'm not even sure that this is going to be necessary for today and following as the wound has filled in quite nicely. In regard  to the left ankle I do believe that we're seeing excellent epithelialization from the edge as well as granulation in the central portion the tendon is still exposed but there's no evidence of necrotic bone and in general I feel like the patient has made excellent progress even compared to last week with just one week of the snap vac. 09/11/18; this is a patient who has wounds on her bilateral lateral malleoli. Initially both of these were deep stage IV wounds in the setting of chronic arterial insufficiency. She has been revascularized. As I understand think she been using snap vacs to both of these wounds however the area on the right became more superficial and currently she is only using it on the left. Using silver collagen on the right and silver collagen under the back on the left I believe 09/19/18 on evaluation today patient actually appears to be doing very well in regard to her lateral malleolus or ulcers bilaterally. She has been tolerating the dressing  changes without complication. Fortunately there does not appear to be any evidence of infection at this time. Overall I feel like she is improving in an excellent manner and I'm very pleased with the fact that everything seems to be turning towards the better for her. This has obviously been a long road. 09/27/18 on evaluation today patient actually appears to be doing very well in regard to her bilateral lateral malleolus ulcers. She has been tolerating the dressing changes without complication. Fortunately there does not appear to be any evidence of infection at this time which is also great news. No fevers, chills, nausea, or vomiting noted at this time. Overall I feel like she is doing excellent with the snap vac on the left malleolus. She had 40 mL of fluid collection over the past week. 10/04/18 on evaluation today patient actually appears to be doing well in regard to her bilateral lateral malleolus ulcers. She continues to tolerate the dressing changes without complication. One issue that I see is the snap vac on the left lateral malleolus which appears to have sealed off some fluid underlying this area and has not really allowed it to heal to the degree that I would like to see. For that reason I did suggest at this point we may want to pack a small piece of packing strip into this region to allow it to more effectively wick out fluid. 10/11/18 in general the patient today does not feel that she has been doing very well. She's been a little bit lethargic and subsequently is having bodyaches as well according to what she tells me today. With that being said overall she has been concerned with the fact that something may be worsening although to be honest her wounds really have not been appearing poorly. She does have a new ulcer on her left heel unfortunately. This may be pressure related. Nonetheless it seems to me to have potentially started at least as a blister I do not see any evidence of  deep tissue injury. In regard to the left ankle the snap vac still seems to be causing the ceiling off of the deeper part of the wound which is in turn trapping fluid. I'm not extremely pleased with the overall appearance as far as progress from last week to this week therefore I'm gonna discontinue the snap vac at this point. 10/18/18 patient unfortunately this point has not been feeling well for the past several days. She was seen by Grayland Ormond her primary care provider  who is a Librarian, academic at [facility]. Subsequently she states that she's been very weak and generally feeling malaise. No fevers, chills, nausea, or vomiting noted at this time. With that being said bloodwork was performed at the PCP office on the 11th of this month which showed a white blood cell count of 10.7. This was repeated today and shows a white blood cell count of 12.4. This does show signs of worsening. Coupled with the fact that she is feeling worse and that her left ankle wound is not really showing signs of improvement I feel like this is an indication that the osteomyelitis is likely exacerbating not improving. Overall I think we may also want to check her C-reactive protein and sedimentation rate. Actually did call [facility] office this afternoon while the patient was in the office here with me. Subsequently based on the findings we discussed treatment possibilities and I think that it is appropriate for [place] to go ahead and initiate treatment with doxycycline which I'm going to do. Subsequently he did agree to see about adding a CRP and sedimentation rate to her orders. If that has not already been drawn to where they can run it they will contact the patient she can come back to have that check. They are in agreement with plan as far as the patient and her daughter are concerned. Nonetheless also think we need to get in touch with Dr. Henreitta Leber office to see about getting the patient scheduled with  him as soon as possible. 11/08/18 on evaluation today patient presents for follow-up concerning her bilateral foot and ankle ulcers. I did do an extensive review of her chart in epic today. Subsequently she was seen by [name] he did initiate Cefepime IV antibiotic therapy. Subsequently she had some issues with her PICC line this had to be removed because it was coiled and then replaced. Fortunately that was now settled. Unfortunately she has continued have issues with her left heel as well as the issues that she is experiencing with her bilateral lateral malleolus regions. I do believe however both areas seem to be doing a little bit better on evaluation today which is good news. No fevers, chills, nausea, or vomiting noted at this time. She actually has an angiogram schedule with Dr. dew on this coming Monday, November 11, 2018. Subsequently the patient states that she is feeling much better especially than what she was roughly 2 weeks ago. She actually had to cancel an appointment because she was feeling so poorly. No fevers, chills, nausea, or vomiting noted at this time. 11/15/18 on evaluation today patient actually is status post having had her angiogram with Dr. dew Monday, four days ago. 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 He had to go and work on several areas of the vasculature fortunately he was able to obtain no more than a 30% residual stenosis throughout post procedure. I reviewed this note today. I think this will definitely help with healing at this time. Fortunately there does not appear to be any signs of infection and I do feel like ratio already has a better appearance to it. 11/22/18 upon evaluation today patient actually appears to be doing very well in regard to her wounds in general. The right lateral malleolus looks excellent the heel looks better in the left lateral malleolus also appears to be doing a little better. With that being said the  right second toe actually appears to be open  and training we been watching this is been dry and stable but now is open. 12/03/2018 Seen today for follow-up and management of multiple bilateral lower extremity wounds. New pressure injury of the great toe which is closed at this time. Wound of the right distal second toe appears larger today with deep undermining and a pocket of fluid present within the undermining region. Left and right malleolus is wounds are stable today with no signs and symptoms of infection.Denies any needs or concerns during exam today. 12/13/18 on evaluation today patient appears to be doing somewhat better in regard to her left heel ulcer. She also seems to be completely healed in regard to the right lateral malleolus ulcer. The left malleolus ulcer is smaller what unfortunately the wounds which are new over the first and second toes of the right foot are what are most concerning at this point especially the second. Both areas did require sharp debridement today. 12/20/18 on evaluation today patient's wound actually appears to be doing better in regard to left lateral ankle and her right lateral ankle continues to remain healed. The hill ulcer on the left is improved. She does have improvement noted as well in regard to both toe ulcers. Overall I'm very pleased in this regard. No fevers, chills, nausea, or vomiting noted at this time. TARON, CONREY ([id number]) 12/23/18 on evaluation today patient is seen after she had her toenails trimmed at the podiatrist office due to issues with her right great toe. There was what appeared to be dark eschar on the surface of the wound which had her in the podiatrist concerned. Nonetheless as I remember that during the last office visit I had utilize silver nitrate of this area I was much less concerned about the situation. Subsequently I was able to clean off much of this tissue without any complication today. This does not appear to  show any signs of infection and actually look somewhat better compared to last time post debridement. Her second toe on the right foot actually had callous over and there did appear still be some fluid underneath this that would require debridement today. 12/27/18 on evaluation today patient actually appears to be showing signs of improvement at all locations. Even the left lateral ankle although this is not quite as great as the other sites. Fortunately there does not appear to be any signs of infection at this time and both of her toes on the right foot seem to be showing signs of improvement which is good news and very pleased in this regard. 01/03/19 on evaluation today patient appears to be doing better for the most part in regard to her wounds in particular. There does not appear to be any evidence of infection at this time which is good news. Fortunately there is no sign of really worsening anywhere except for the right great toe which she does have what appears to be a bruise/deep tissue injury which is very superficial and already resolving. I'm not sure where this came from I questioned her extensively and she does not recall what may have happened with this. Other than that the patient seems to be doing well even the left lateral ankle ulcer looks good and is getting smaller. 01/10/19 on evaluation today patient appears to be doing well in regard to her left heel wound and both of her toe wounds. Overall I feel like there is definitely improvement here and I'm happy in that regard. With that being said unfortunately she is having issues  with the left lateral malleolus ulcer which unfortunately still has a lot of depth to it. This is gonna be a very difficult wound for [place] to be able to truly get to heal. I may want to consider some type of skin substitute to see if this would be of benefit for her. I'll discuss this with her more the next visit most likely. This was something I thought about  more at the end of the visit when I was Artie out of the room and the patient had been discharged. 01/17/19 on evaluation today patient appears to be doing very well in regard to her wounds in general. She's been making excellent progress at this time. Fortunately there's no sign of infection at this time either. No fevers, chills, nausea, or vomiting noted at this time. The biggest issue is still her left lateral malleolus where it appears to be doing well and is getting smaller but still shows a small corner where this is deeper and goes down into what appears to be the joint space. Nonetheless this is taking much longer to heal although it still looks better in smaller than previous evaluations. 01/24/19 on evaluation today patient's wounds actually appear to be doing rather well in general overall. She did require some sharp debridement in regard to the right great toe but everything else appears to be doing excellent no debridement was even necessary. No fevers, chills, nausea, or vomiting noted at this time. 01/31/19 on evaluation today patient actually appears to be doing much better in regard to her left foot wound on the heel as well as the ankle. The right great toe appears to be a little bit worse today this had callous over and trapped a lot of fluid underneath. Fortunately there's no signs of infection at any site which is great news. 02/07/19 on evaluation today patient actually appears to be doing decently well in regard to all of her ulcers at this point. No sharp debridement was required she is a little bit of hyper granulation in regard to the left lateral ankle as well as the left heel but the hill itself is almost completely healed which is excellent news. Overall been very pleased in this regard. 02/14/19 on evaluation today patient actually appears to be doing very well in regard to her ulcers on the right first toe, left lateral malleolus, and left heel. In fact the heel is almost  completely healed at this point. The patient does not show any signs of infection which is good news. Overall very pleased with how things have progressed. 04/18/19 Telehealth Evaluation During the COVID-19 National Emergency: Verbal Consent: Obtained from patient Allergies: reviewed and the active list is current. Medication changes: patient has no current medication changes. COVID-19 Screening: 1. Have you traveled internationally or on a cruise ship in the last 14 dayso No 2. Have you had contact with someone with or under investigation for COVID-19o No 3. Have you had a fever, cough, sore throat, or experiencing shortness of breatho No on evaluation today actually did have a visit with this patient through a telehealth encounter with her home health nurse. Subsequently it was noted that the patient actually appears to be doing okay in regard to her wounds both the right great toe as well as the left lateral malleolus have shown signs of improvement although this in your theme around the left lateral malleolus there eschar coverings for both locations. The question is whether or not they are actually close and whether  or not home health needs to discharge the patient or not. Nonetheless my concern is this point obviously is that without actually seeing her and being able to evaluate this directly I cannot ensure that she is completely healed which is the question that I'm being asked. 04/22/19 on evaluation today patient presents for her first evaluation since last time I saw her which was actually February 14, 2019. I did do a telehealth visit last week in which point it was questionable whether or not she may be healed and had to bring her in today for confirmation. With that being said she does seem to be doing quite well at this point which is good news. There does not appear to be any drainage in the deed I believe her wounds may be healed. Readmission: 09/04/2019 on evaluation today patient  appears to be doing unfortunately somewhat more poorly in regard to her left foot ulcer secondary to a wound that began on 08/21/2019 at least when she first noticed this. Fortunately she has not had any evidence of active infection at this time. Systemically. I also do not necessarily see any evidence of infection at the blister/wound site on the first metatarsal head plantar aspect. This almost appears to be something that may have just rubbed inappropriately causing this to breakdown. They did not want a wait too long to come in to be seen as again she had significant issues in the past with wounds that took quite a while to heal in fact it was close to 2 years. Nonetheless this does not appear to be quite that bad but again we do need to remove some of the necrotic tissue from the surface of the wound to tell exactly the extent. She does not appear to have any significant arterial disease at this point and again her last ABIs and TBI's are recorded above in the alert section her left ABI was 1.27 with a TBI of 0.72 to the right ABI 1.08 with a TBI of 0.39. Other than this the patient has been doing quite well since I last saw her and that was in May 2020. CALLAN, YONTZ ([id number]) 09/11/2019 on evaluation today patient appeared to be doing very well with regard to her plantar foot ulcer on the left. In fact this appears to be almost completely healed which is awesome. That is after just 1 week of intervention. With that being said there is no signs of active infection at this time. 09/18/2019 on evaluation today patient actually appears to be doing excellent in fact she is completely healed based on what I am seeing at this point. Fortunately there is no signs of active infection at this time and overall patient is very pleased to hear that this area has healed so quickly. Readmission: 05/13/2021 upon evaluation today this patient presents for reevaluation here in the clinic. This is a wound  that actually we previously took care of. She had 1 on the right ankle and the left the left turned out to be be harder due to to heal but nonetheless is doing great at this point as the right that has reopened and it was noted first just several weeks ago with a scab over it and came off in just the past few days. Fortunately there does not appear to be any obvious evidence of significant active infection at this time which is great news. No fevers, chills, nausea, vomiting, or diarrhea. The patient does have a history of pacemaker along with being  on Eliquis currently as well. There does not appear to be any signs of this interfering in any way with her wound. She does have swelling we previously had compression socks for her ordered but again it does not look like she wears these on a regular basis by any means. 05/26/2021 upon evaluation today patient appears to be doing well with regard to her wound which is actually showing signs of excellent improvement. There does not appear to be any signs of active infection which is great news and overall very pleased with where things stand today. No fevers, chills, nausea, vomiting, or diarrhea. 06/02/2021 upon evaluation today patient's wound actually showing signs of excellent improvement. Fortunately there does not appear to be any signs of active infection which is great news. I think the patient is making good progress with regard to her wounds in general. 06/09/2021 upon evaluation today patient appears to be doing excellent in regard to her wounds currently. Fortunately there does not appear to be any signs of active infection which is great news. No fevers, chills, nausea, vomiting, or diarrhea. Overall extremely pleased with where things stand today. I think the patient is making excellent progress. 06/16/2021 upon evaluation today patient appears to be doing well in regard to her wound. This is going require little bit of debridement today and that  was discussed with the patient. Otherwise she seems to be doing quite well and I am actually very pleased with where things stand at this point. No fevers, chills, nausea, vomiting, or diarrhea. Objective Constitutional Well-nourished and well-hydrated in no acute distress. Vitals Time Taken: 2:32 PM, Height: 62 in, Weight: 150 lbs, BMI: 27.4, Temperature: 98.2 F, Pulse: 85 bpm, Respiratory Rate: 18 breaths/min, Blood Pressure: 119/59 mmHg. Respiratory normal breathing without difficulty. Psychiatric this patient is able to make decisions and demonstrates good insight into disease process. Alert and Oriented x 3. pleasant and cooperative. General Notes: Upon inspection patient's wound bed showed signs of good granulation epithelization at this point. There does not appear to be any signs of infection which is great and overall I am extremely pleased with where things stand. I do not see any evidence that this is worsening and in fact I feel like she is getting better. Integumentary (Hair, Skin) Wound #7 status is Open. Original cause of wound was Gradually Appeared. The date acquired was: 05/12/2021. The wound has been in treatment 4 weeks. The wound is located on the Right,Lateral Malleolus. The wound measures 0.4cm length x 0.5cm width x 0.2cm depth; 0.157cm^2 area and 0.031cm^3 volume. There is Fat Layer (Subcutaneous Tissue) exposed. There is no tunneling or undermining noted. There is a medium amount of serous drainage noted. The wound margin is distinct with the outline attached to the wound base. There is large (67-100%) red, pink, pale granulation within the wound bed. There is a small (1-33%) amount of necrotic tissue within the wound bed including Adherent Slough. Procedures Wound #7 Pre-procedure diagnosis of Wound #7 is a Pressure Ulcer located on the Right,Lateral Malleolus . There was a Excisional Skin/Subcutaneous Tissue Debridement with a total area of 0.2 sq cm performed by  Tommie Sams., PA-C. With the following instrument(s): Curette to remove Viable and Non-Viable tissue/material. Material removed includes Callus, Subcutaneous Tissue, Slough, and Biofilm. A time out was conducted at 14:48, prior ZYIAH, WITHINGTON ([id number]) to the start of the procedure. A Minimum amount of bleeding was controlled with Pressure. The procedure was tolerated well. Post Debridement Measurements: 0.4cm length x  0.5cm width x 0.2cm depth; 0.031cm^3 volume. Post debridement Stage noted as Category/Stage III. Character of Wound/Ulcer Post Debridement is improved. Post procedure Diagnosis Wound #7: Same as Pre-Procedure Pre-procedure diagnosis of Wound #7 is a Pressure Ulcer located on the Right,Lateral Malleolus . There was a Three Layer Compression Therapy Procedure by Donnamarie Poag, RN. Post procedure Diagnosis Wound #7: Same as Pre-Procedure Plan Follow-up Appointments: Return Appointment in 1 week. Nurse Visit as needed Bathing/ Shower/ Hygiene: May shower with wound dressing protected with water repellent cover or cast protector. Edema Control - Lymphedema / Segmental Compressive Device / Other: Optional: One layer of unna paste to top of compression wrap (to act as an anchor). Elevate, Exercise Daily and Avoid Standing for Long Periods of Time. Elevate legs to the level of the heart and pump ankles as often as possible Elevate leg(s) parallel to the floor when sitting. WOUND #7: - Malleolus Wound Laterality: Right, Lateral Cleanser: Soap and Water 1 x Per Week/30 Days Discharge Instructions: Gently cleanse wound with antibacterial soap, rinse and pat dry prior to dressing wounds Primary Dressing: Prisma 4.34 (in) 1 x Per Week/30 Days Discharge Instructions: Moisten w/normal saline or sterile water; Cover wound as directed. Do not remove from wound bed. Secondary Dressing: ABD Pad 5x9 (in/in) 1 x Per Week/30 Days Discharge Instructions: Cover with ABD pad Compression  Wrap: Profore Lite LF 3 Multilayer Compression Bandaging System 1 x Per Week/30 Days Discharge Instructions: Apply 3 multi-layer wrap as prescribed. 1. Would recommend him going continue with the wound care measures as before and the patient is in agreement with plan. This includes the use of the silver collagen followed by an ABD pad. 2. I am also can recommend continuation of the 3 layer compression wrap. 3. I am also can recommend the patient continue to elevate her legs I think this is very important. We will see patient back for reevaluation in 1 week here in the clinic. If anything worsens or changes patient will contact our office for additional recommendations. Electronic Signature(s) Signed: 06/16/2021 5:22:59 PM By: Worthy Keeler PA-C Entered By: Worthy Keeler on 06/16/2021 17:22:58 [employer], [name] ([id number]) -------------------------------------------------------------------------------- SuperBill Details Patient Name: [name] Date of Service: 06/16/2021 Medical Record Number: [id number] Patient Account Number: [id number] Date of Birth/Sex: October 27, 1923 (87 y.o. F) Treating RN: Donnamarie Poag Primary Care Provider: Adrian Prows Other Clinician: Referring Provider: Adrian Prows Treating Provider/Extender: Skipper Cliche in Treatment: 4 Diagnosis Coding ICD-10 Codes Code Description [phone] Pressure ulcer of right ankle, stage 3 I89.0 Lymphedema, not elsewhere classified I87.2 Venous insufficiency (chronic) (peripheral) Z95.0 Presence of cardiac pacemaker Z79.01 Long term (current) use of anticoagulants Facility Procedures CPT4 Code: [id number] Description: 12248 - DEB SUBQ TISSUE 20 SQ CM/< Modifier: Quantity: 1 CPT4 Code: Description: ICD-10 Diagnosis Description 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, stage 3 Modifier: Quantity: Physician Procedures CPT4 Code: 2500370 Description: 48889 - WC PHYS SUBQ TISS 20 SQ CM Modifier: Quantity:  1 CPT4 Code: Description: ICD-10 Diagnosis Description 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, stage 3 Modifier: Quantity: Electronic Signature(s) Signed: 06/16/2021 5:23:31 PM By: Worthy Keeler PA-C Previous Signature: 06/16/2021 4:28:40 PM Version By: Donnamarie Poag Entered By: Worthy Keeler on 06/16/2021 17:23:31

## 2021-06-23 ENCOUNTER — Other Ambulatory Visit: Payer: Self-pay

## 2021-06-23 ENCOUNTER — Encounter: Payer: Medicare Other | Admitting: Physician Assistant

## 2021-06-23 DIAGNOSIS — L89513 Pressure ulcer of right ankle, stage 3: Secondary | ICD-10-CM | POA: Diagnosis not present

## 2021-06-23 NOTE — Progress Notes (Signed)
OMAR, ORREGO ([id number]) Visit Report for 06/23/2021 Arrival Information Details Patient Name: PETA, PEACHEY Date of Service: 06/23/2021 11:00 AM Medical Record Number: [id number] Patient Account Number: [id number] Date of Birth/Sex: 1923/02/22 (87 y.o. F) Treating RN: Carlene Coria Primary Care Mallika Sanmiguel: Adrian Prows Other Clinician: Referring Cai Flott: Adrian Prows Treating Kalonji Zurawski/Extender: Skipper Cliche in Treatment: 5 Visit Information History Since Last Visit All ordered tests and consults were completed: No Patient Arrived: [name] Added or deleted any medications: No Arrival Time: 10:51 Any new allergies or adverse reactions: No Accompanied By: self Had a fall or experienced change in No Transfer Assistance: None activities of daily living that may affect Patient Identification Verified: Yes risk of falls: Secondary Verification Process Completed: Yes Signs or symptoms of abuse/neglect since last visito No Patient Has Alerts: Yes Hospitalized since last visit: No Patient Alerts: Patient on Blood Thinner Implantable device outside of the clinic excluding No NOT DIABETIC cellular tissue based products placed in the center ***ELIQUIS*** since last visit: Has Dressing in Place as Prescribed: Yes Has Compression in Place as Prescribed: Yes Pain Present Now: No Electronic Signature(s) Signed: 06/23/2021 4:17:32 PM By: Carlene Coria RN Entered By: Carlene Coria on 06/23/2021 10:55:53 Foskey, [name] ([id number]) -------------------------------------------------------------------------------- Clinic Level of Care Assessment Details Patient Name: [name] Date of Service: 06/23/2021 11:00 AM Medical Record Number: [id number] Patient Account Number: [id number] Date of Birth/Sex: [birth year] (87 y.o. F) Treating RN: Carlene Coria Primary Care Josecarlos Harriott: Adrian Prows Other Clinician: Referring Vickye Astorino: Adrian Prows Treating  Kristalyn Bergstresser/Extender: Skipper Cliche in Treatment: 5 Clinic Level of Care Assessment Items TOOL 1 Quantity Score _0  - Use when EandM and Procedure is performed on INITIAL visit 0 ASSESSMENTS - Nursing Assessment / Reassessment _1  - General Physical Exam (combine w/ comprehensive assessment (listed just below) when performed on new 0 pt. evals) _2  - 0 Comprehensive Assessment (HX, ROS, Risk Assessments, Wounds Hx, etc.) ASSESSMENTS - Wound and Skin Assessment / Reassessment _3  - Dermatologic / Skin Assessment (not related to wound area) 0 ASSESSMENTS - Ostomy and/or Continence Assessment and Care _4  - Incontinence Assessment and Management 0 _5  - 0 Ostomy Care Assessment and Management (repouching, etc.) PROCESS - Coordination of Care _6  - Simple Patient / Family Education for ongoing care 0 _7  - 0 Complex (extensive) Patient / Family Education for ongoing care _8  - 0 Staff obtains Programmer, systems, Records, Test Results / Process Orders _9  - 0 Staff telephones HHA, Nursing Homes / Clarify orders / etc _10  - 0 Routine Transfer to another Facility (non-emergent condition) _11  - 0 Routine Hospital Admission (non-emergent condition) _12  - 0 New Admissions / Biomedical engineer / Ordering NPWT, Apligraf, etc. _13  - 0 Emergency Hospital Admission (emergent condition) PROCESS - Special Needs _14  - Pediatric / Minor Patient Management 0 _15  - 0 Isolation Patient Management _16  - 0 Hearing / Language / Visual special needs _17  - 0 Assessment of Community assistance (transportation, D/C planning, etc.) _18  - 0 Additional assistance / Altered mentation _19  - 0 Support Surface(s) Assessment (bed, cushion, seat, etc.) INTERVENTIONS - Miscellaneous _20  - External ear exam 0 _21  - 0 Patient Transfer (multiple staff / Civil Service fast streamer / Similar devices) _22  - 0 Simple Staple / Suture removal (25 or less) _23  - 0 Complex Staple / Suture removal (26 or more) _24  - 0 Hypo/Hyperglycemic Management (do not  check if billed separately) _25  - 0 Ankle / Brachial Index (ABI) - do not check if billed separately Has the patient been seen at the  hospital within the last three years: Yes Total Score: 0 Level Of Care: ____ [name] ([id number]) Electronic Signature(s) Signed: 06/23/2021 4:17:32 PM By: Carlene Coria RN Entered By: Carlene Coria on 06/23/2021 11:40:17 Condon, [name] ([id number]) -------------------------------------------------------------------------------- Compression Therapy Details Patient Name: [name] Date of Service: 06/23/2021 11:00 AM Medical Record Number: [id number] Patient Account Number: [id number] Date of Birth/Sex: [birth year] (87 y.o. F) Treating RN: Carlene Coria Primary Care Carlous Olivares: Adrian Prows Other Clinician: Referring Dasean Brow: Adrian Prows Treating Mirayah Wren/Extender: Skipper Cliche in Treatment: 5 Compression Therapy Performed for Wound Assessment: Wound #7 Right,Lateral Malleolus Performed By: Jake Church, RN Compression Type: Three Layer Post Procedure Diagnosis Same as Pre-procedure Electronic Signature(s) Signed: 06/23/2021 4:17:32 PM By: Carlene Coria RN Entered By: Carlene Coria on 06/23/2021 11:39:23 Iovino, [name] ([id number]) -------------------------------------------------------------------------------- Encounter Discharge Information Details Patient Name: [name] Date of Service: 06/23/2021 11:00 AM Medical Record Number: [id number] Patient Account Number: [id number] Date of Birth/Sex: [birth year] (87 y.o. F) Treating RN: Carlene Coria Primary Care Aslan Montagna: Adrian Prows Other Clinician: Referring Samirah Scarpati: Adrian Prows Treating Luisenrique Conran/Extender: Skipper Cliche in Treatment: 5 Encounter Discharge Information Items Discharge Condition: Stable Ambulatory Status: Walker Discharge Destination: Home Transportation: Private Auto Accompanied By: daughter Schedule Follow-up  Appointment: Yes Clinical Summary of Care: Patient Declined Electronic Signature(s) Signed: 06/23/2021 4:17:32 PM By: Carlene Coria RN Entered By: Carlene Coria on 06/23/2021 11:41:25 Knick, [name] ([id number]) -------------------------------------------------------------------------------- Lower Extremity Assessment Details Patient Name: [name] Date of Service: 06/23/2021 11:00 AM Medical Record Number: [id number] Patient Account Number: [id number] Date of Birth/Sex: [birth year] (87 y.o. F) Treating RN: Carlene Coria Primary Care Ocie Stanzione: Adrian Prows Other Clinician: Referring Emari Demmer: Adrian Prows Treating Gideon Burstein/Extender: Skipper Cliche in Treatment: 5 Edema Assessment Assessed: [Left: No] [Right: No] [Left: Edema] [Right: :] Calf Left: Right: Point of Measurement: 33 cm From Medial Instep 29 cm Ankle Left: Right: Point of Measurement: 10 cm From Medial Instep 17 cm Vascular Assessment Pulses: Dorsalis Pedis Palpable: [Right:Yes] Electronic Signature(s) Signed: 06/23/2021 4:17:32 PM By: Carlene Coria RN Entered By: Carlene Coria on 06/23/2021 11:09:32 Riven[place]ark, [name] ([id number]) -------------------------------------------------------------------------------- Multi Wound Chart Details Patient Name: [name] Date of Service: 06/23/2021 11:00 AM Medical Record Number: [id number] Patient Account Number: [id number] Date of Birth/Sex: 1922-12-30 (87 y.o. F) Treating RN: Carlene Coria Primary Care Jaedin Trumbo: Adrian Prows Other Clinician: Referring Ketara Cavness: Adrian Prows Treating Faizaan Falls/Extender: Skipper Cliche in Treatment: 5 Vital Signs Height(in): 62 Pulse(bpm): 35 Weight(lbs): 150 Blood Pressure(mmHg): 121/72 Body Mass Index(BMI): 27 Temperature(F): 98.6 Respiratory Rate(breaths/min): 18 Photos: [N/A:N/A] Wound Location: Right, Lateral Malleolus N/A N/A Wounding Event: Gradually Appeared N/A N/A Primary  Etiology: Pressure Ulcer N/A N/A Comorbid History: Cataracts, Congestive Heart Failure, N/A N/A Hypertension, Peripheral Arterial Disease, Osteoarthritis, Neuropathy Date Acquired: 05/12/2021 N/A N/A Weeks of Treatment: 5 N/A N/A Wound Status: Open N/A N/A Measurements L x W x D (cm) 0.5x0.7x0.2 N/A N/A Area (cm) : 0.275 N/A N/A Volume (cm) : 0.055 N/A N/A % Reduction in Area: 75.50% N/A N/A % Reduction in Volume: 75.60% N/A N/A Classification: Category/Stage III N/A N/A Exudate Amount: Medium N/A N/A Exudate Type: Serous N/A N/A Exudate Color: amber N/A N/A Wound Margin: Distinct, outline attached N/A N/A Granulation Amount: Large (67-100%) N/A N/A Granulation Quality: Red, Pink, Pale N/A N/A Necrotic Amount: Small (1-33%) N/A N/A Exposed Structures: Fat Layer (Subcutaneous Tissue): N/A N/A Yes Fascia: No Tendon: No Muscle: No Joint: No Bone: No Epithelialization: Small (1-33%) N/A N/A Treatment Notes Electronic Signature(s) Signed:  06/23/2021 4:17:32 PM By: Carlene Coria RN Entered By: Carlene Coria on 06/23/2021 11:27:01 Falcon, [name] ([id number]) -------------------------------------------------------------------------------- [place] Details Patient Name: ALEIGHA, GILANI Date of Service: 06/23/2021 11:00 AM Medical Record Number: [id number] Patient Account Number: [id number] Date of Birth/Sex: January 10, 1923 (87 y.o. F) Treating RN: Carlene Coria Primary Care Abbygail [place]: Adrian Prows Other Clinician: Referring Terah Robey: Adrian Prows Treating Brix Brearley/Extender: Skipper Cliche in Treatment: 5 Active Inactive Wound/Skin Impairment Nursing Diagnoses: Knowledge deficit related to ulceration/compromised skin integrity Goals: Patient/caregiver will verbalize understanding of skin care regimen Date Initiated: 05/13/2021 Date Inactivated: 05/13/2021 Target Resolution Date: 06/12/2021 Goal Status: Met Ulcer/skin breakdown will have a volume  reduction of 30% by week 4 Date Initiated: 05/13/2021 Date Inactivated: 06/16/2021 Target Resolution Date: 06/12/2021 Goal Status: Met Ulcer/skin breakdown will have a volume reduction of 50% by week 8 Date Initiated: 05/13/2021 Target Resolution Date: 07/13/2021 Goal Status: Active Ulcer/skin breakdown will have a volume reduction of 80% by week 12 Date Initiated: 05/13/2021 Target Resolution Date: 08/13/2021 Goal Status: Active Ulcer/skin breakdown will heal within 14 weeks Date Initiated: 05/13/2021 Target Resolution Date: 09/12/2021 Goal Status: Active Interventions: Assess patient/caregiver ability to obtain necessary supplies Assess patient/caregiver ability to perform ulcer/skin care regimen upon admission and as needed Assess ulceration(s) every visit Notes: Electronic Signature(s) Signed: 06/23/2021 4:17:32 PM By: Carlene Coria RN Entered By: Carlene Coria on 06/23/2021 11:26:52 [place], [name] ([id number]) -------------------------------------------------------------------------------- Pain Assessment Details Patient Name: [name] Date of Service: 06/23/2021 11:00 AM Medical Record Number: [id number] Patient Account Number: [id number] Date of Birth/Sex: 1923/09/15 (87 y.o. F) Treating RN: Carlene Coria Primary Care Akaiya Touchette: Adrian Prows Other Clinician: Referring Artin Mceuen: Adrian Prows Treating Amarrah Meinhart/Extender: Skipper Cliche in Treatment: 5 Active Problems Location of Pain Severity and Description of Pain Patient Has Paino No Site Locations Pain Management and Medication Current Pain Management: Electronic Signature(s) Signed: 06/23/2021 4:17:32 PM By: Carlene Coria RN Entered By: Carlene Coria on 06/23/2021 10:56:26 Jaffee, [name] ([id number]) -------------------------------------------------------------------------------- Patient/Caregiver Education Details Patient Name: [name] Date of Service: 06/23/2021 11:00 AM Medical  Record Number: [id number] Patient Account Number: [id number] Date of Birth/Gender: [birth year] (87 y.o. F) Treating RN: Carlene Coria Primary Care Physician: Adrian Prows Other Clinician: Referring Physician: Adrian Prows Treating Physician/Extender: Skipper Cliche in Treatment: 5 Education Assessment Education Provided To: Patient Education Topics Provided Wound/Skin Impairment: Methods: Explain/Verbal Responses: State content correctly Electronic Signature(s) Signed: 06/23/2021 4:17:32 PM By: Carlene Coria RN Entered By: Carlene Coria on 06/23/2021 11:40:38 Sabol, [name] ([id number]) -------------------------------------------------------------------------------- Wound Assessment Details Patient Name: [name] Date of Service: 06/23/2021 11:00 AM Medical Record Number: [id number] Patient Account Number: [id number] Date of Birth/Sex: 1923-05-19 (87 y.o. F) Treating RN: Carlene Coria Primary Care Shontay Wallner: Adrian Prows Other Clinician: Referring Xavion Muscat: Adrian Prows Treating Verbie Babic/Extender: Skipper Cliche in Treatment: 5 Wound Status Wound Number: 7 Primary Pressure Ulcer Etiology: Wound Location: Right, Lateral Malleolus Wound Open Wounding Event: Gradually Appeared Status: Date Acquired: 05/12/2021 Comorbid Cataracts, Congestive Heart Failure, Hypertension, Weeks Of Treatment: 5 History: Peripheral Arterial Disease, Osteoarthritis, Neuropathy Clustered Wound: No Photos Wound Measurements Length: (cm) 0.5 Width: (cm) 0.7 Depth: (cm) 0.2 Area: (cm) 0.275 Volume: (cm) 0.055 % Reduction in Area: 75.5% % Reduction in Volume: 75.6% Epithelialization: Small (1-33%) Tunneling: No Undermining: No Wound Description Classification: Category/Stage III Wound Margin: Distinct, outline attached Exudate Amount: Medium Exudate Type: Serous Exudate Color: amber Foul Odor After Cleansing: No Slough/Fibrino Yes Wound Bed Granulation  Amount: Large (67-100%) Exposed Structure Granulation Quality: Red, Pink,  Pale Fascia Exposed: No Necrotic Amount: Small (1-33%) Fat Layer (Subcutaneous Tissue) Exposed: Yes Necrotic Quality: Adherent Slough Tendon Exposed: No Muscle Exposed: No Joint Exposed: No Bone Exposed: No Treatment Notes Wound #7 (Malleolus) Wound Laterality: Right, Lateral Cleanser Soap and Water Discharge Instruction: Gently cleanse wound with antibacterial soap, rinse and pat dry prior to dressing wounds Peri-Wound Care Sprigg, [name] ([id number]) Topical Primary Dressing Prisma 4.34 (in) Discharge Instruction: Moisten w/normal saline or sterile water; Cover wound as directed. Do not remove from wound bed. Secondary Dressing ABD Pad 5x9 (in/in) Discharge Instruction: Cover with ABD pad Secured With Compression Wrap Profore Lite LF 3 Multilayer Compression Bandaging System Discharge Instruction: Apply 3 multi-layer wrap as prescribed. Compression Stockings Add-Ons Electronic Signature(s) Signed: 06/23/2021 4:17:32 PM By: Carlene Coria RN Entered By: Carlene Coria on 06/23/2021 11:08:25 Porr, [name] ([id number]) -------------------------------------------------------------------------------- Vitals Details Patient Name: [name] Date of Service: 06/23/2021 11:00 AM Medical Record Number: [id number] Patient Account Number: [id number] Date of Birth/Sex: 11/13/1923 (87 y.o. F) Treating RN: Carlene Coria Primary Care Foch Rosenwald: Adrian Prows Other Clinician: Referring Makinna Andy: Adrian Prows Treating Zyron Deeley/Extender: Skipper Cliche in Treatment: 5 Vital Signs Time Taken: 10:55 Temperature (F): 98.6 Height (in): 62 Pulse (bpm): 89 Weight (lbs): 150 Respiratory Rate (breaths/min): 18 Body Mass Index (BMI): 27.4 Blood Pressure (mmHg): 121/72 Reference Range: 80 - 120 mg / dl Electronic Signature(s) Signed: 06/23/2021 4:17:32 PM By: Carlene Coria RN Entered By: Carlene Coria on 06/23/2021 10:56:15

## 2021-06-23 NOTE — Progress Notes (Addendum)
[name], [name] ([id number]) Visit Report for 06/23/2021 Chief Complaint Document Details Patient Name: [name]. Date of Service: 06/23/2021 11:00 AM Medical Record Number: [id number] Patient Account Number: [id number] Date of Birth/Sex: 04-Mar-1923 (87 y.o. F) Treating RN: Dolan Amen Primary Care Provider: Adrian Prows Other Clinician: Referring Provider: Adrian Prows Treating Provider/Extender: Skipper Cliche in Treatment: 5 Information Obtained from: Patient Chief Complaint Right foot ulcer Electronic Signature(s) Signed: 06/23/2021 11:17:42 AM By: Worthy Keeler PA-C Entered By: Worthy Keeler on 06/23/2021 11:17:42 [name], [name] ([id number]) -------------------------------------------------------------------------------- HPI Details Patient Name: [name] Date of Service: 06/23/2021 11:00 AM Medical Record Number: [id number] Patient Account Number: [id number] Date of Birth/Sex: 1923-08-04 (87 y.o. 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 Dr. Amalia Hailey the podiatrist saw her on 07/20/2017 and sent her to the wound center for possible hyperbaric oxygen therapy. past medical history of peripheral vascular disease, varicose veins, status post appendectomy, basal cell carcinoma excision from the left leg, cholecystectomy, pacemaker placement, right lower extremity angiography done by Dr. dew in March 2017 with placement of a stent. there is also note of a successful ablation of the right small saphenous vein done which was reviewed by ultrasound on 10/24/2016. the  patient had a right small saphenous vein ablation done on 10/20/2016. The patient has never been a smoker. She has been seen by Dr. Corene Cornea dew the vascular surgeon who most recently saw her on 06/15/2017 for evaluation of ongoing problems with right leg swelling. She had a lower extremity arterial duplex examination done(02/13/17) which showed patent distal right superficial femoral artery stent and above-the-knee popliteal stent without evidence of restenosis. The ABI was more than 1.3 on the right and more than 1.3 on the left. This was consistent with noncompressible arteries due to medial calcification. The right great toe pressure and PPG waveforms are within normal limits and the left great toe pressure and PPG waveforms are decreased. he recommended she continue to wear her compression stockings and continue with elevation. She is scheduled to have a noninvasive arterial study in the near future 08/16/2017 -- had a lower extremity arterial duplex examination done which showed patent distal right superficial femoral artery stent and above-the-knee popliteal stent without evidence of restenosis. The ABI was more than 1.3 on the right and more than 1.3 on the left. This was consistent with noncompressible arteries due to medial calcification. The right great toe pressure and PPG waveforms are within normal limits and the left great toe pressure and PPG waveforms are decreased. the x-ray of the right ankle has not yet been done 08/24/2017 -- had a right ankle x-ray -- IMPRESSION:1. No fracture, bone lesion or evidence of osteomyelitis. 2. Lateral soft tissue swelling with a soft tissue ulcer. she has not yet seen the vascular surgeon for review 08/31/17 on evaluation today patient's wound appears to be showing signs of improvement. She still with her appointment with vascular in order to review her results of her vascular study and then determine if any intervention would be recommended at that  time. No fevers, chills, nausea, or vomiting noted at this time. She has been tolerating the dressing changes without complication. 09/28/17 on evaluation today patient's  wound appears to show signs of good improvement in regard to the granulation tissue which is surfacing. There is still a layer of slough covering the wound and the posterior portion is still significantly deeper than the anterior nonetheless there has been some good sign of things moving towards the better. She is going to go back to Dr. dew for reevaluation to ensure her blood flow is still appropriate. That will be before her next evaluation with [place] next week. No fevers, chills, nausea, or vomiting noted at this time. Patient does have some discomfort rated to be a 3-4/10 depending on activity specifically cleansing the wound makes it worse. 10/05/2017 -- the patient was seen by Dr. Lucky Cowboy last week and noninvasive studies showed a normal right ABI with brisk triphasic waveforms consistent with no arterial insufficiency including normal digital pressures. The duplex showed a patent distal right SFA stent and the proximal SFA was also normal. He was pleased with her test and thought she should have enough of perfusion for normal wound healing. He would see her back in 6 months time. 12/21/17 on evaluation today patient appears to be doing fairly well in regard to her right lateral ankle wound. Unfortunately the main issue that she is expansion at this point is that she is having some issues with what appears to be some cellulitis in the right anterior shin. She has also been noting a little bit of uncomfortable feeling especially last night and her ankle area. I'm afraid that she made the developing a little bit of an infection. With that being said I think it is in the early stages. 12/28/17 on evaluation today patient's ankle appears to be doing excellent. She's making good progress at this point the cellulitis seems to have improved  after last week's evaluation. Overall she is having no significant discomfort which is excellent news. She does have an appointment with Dr. dew on March 29, 2018 for reevaluation in regard to the stent he placed. She seems to have excellent blood flow in the right lower extremity. 01/19/12 on evaluation today patient's wound appears to be doing very well. In fact she does not appear to require debridement at this point, there's no evidence of infection, and overall from the standpoint of the wound she seems to be doing very well. With that being said I believe that it may be time to switch to different dressing away from the [facility] Dressing she tells me she does have a lot going on her friend actually passed away yesterday and she's also having a lot of issues with her husband this obviously is weighing heavy on her as far as your thoughts and concerns today. 01/25/18 on evaluation today patient appears to be doing fairly well in regard to her right lateral malleolus. She has been tolerating the dressing changes without complication. Overall I feel like this is definitely showing signs of improvement as far as how the overall appearance of the wound is there's also evidence of epithelium start to migrate over the granulation tissue. In general I think that she is progressing nicely as far as the wound is concerned. The only concern she really has is whether or not we can switch to every other week visits in order to avoid having as many appointments as her daughters have a difficult time getting her to her appointments as well as the patient's husband to his he is not doing very well at this point. 02/22/18 on evaluation today patient's right lateral malleolus ulcer  appears to be doing great. She has been tolerating the dressing changes without complication. Overall you making excellent progress at this time. Patient is having no significant discomfort. [name], [name] ([id number]) 03/15/18  on evaluation today patient appears to be doing much more poorly in regard to her right lateral ankle ulcer at this point. Unfortunately since have last seen her her husband has passed just a few days ago is obviously weighed heavily on her her daughter also had surgery well she is with her today as usual. There does not appear to be any evidence of infection she does seem to have significant contusion/deep tissue injury to the right lateral malleolus which was not noted previous when I saw her last. It's hard to tell of exactly when this injury occurred although during the time she was spending the night in the hospital this may have been most likely. 03/22/18 on evaluation today patient appears to actually be doing very well in regard to her ulcer. She did unfortunately have a setback which was noted last week however the good news is we seem to be getting back on track and in fact the wound in the core did still have some necrotic tissue which will be addressed at this point today but in general I'm seeing signs that things are on the up and up. She is glad to hear this obviously she's been somewhat concerned that due to the how her wound digressed more recently. 03/29/18 on evaluation today patient appears to be doing fairly well in regard to her right lower extremity lateral malleolus ulcer. She unfortunately does have a new area of pressure injury over the inferior portion where the wound has opened up a little bit larger secondary to the pressure she seems to be getting. She does tell me sometimes when she sleeps at night that it actually hurts and does seem to be pushing on the area little bit more unfortunately. There does not appear to be any evidence of infection which is good news. She has been tolerating the dressing changes without complication. She also did have some bruising in the left second and third toes due to the fact that she may have bump this or injured it although she has  neuropathy so she does not feel she did move recently that may have been where this came from. Nonetheless there does not appear to be any evidence of infection at this time. 04/12/18 on evaluation today patient's wound on the right lateral ankle actually appears to be doing a little bit better with a lot of necrotic docking tissue centrally loosening up in clearing away. However she does have the beginnings of a deep tissue injury on the left lateral malleolus likely due to the fact we've been trying offload the right as much as we have. I think she may benefit from an assistive soft device to help with offloading and it looks like they're looking at one of the doughnut conditions that wraps around the lower leg to offload which I think will definitely do a good job. With that being said I think we definitely need to address this issue on the left before it becomes a wound. Patient is not having significant pain. 04/19/18 on evaluation today patient appears to be doing excellent in regard to the progress she's made with her right lateral ankle ulcer. The left ankle region which did show evidence of a deep tissue injury seems to be resolving there's little fluid noted underneath and a blister there's  nothing open at this point in time overall I feel like this is progressing nicely which is good news. She does not seem to be having significant discomfort at this point which is also good news. 04/25/18-She is here in follow up evaluation for bilateral lateral malleolar ulcers. The right lateral malleolus ulcer with pale subcutaneous tissue exposure, central area of ulcer with tendon/periosteum exposed. The left lateral malleolus ulcer now with central area of nonviable tissue, otherwise deep tissue injury. She is wearing compression wraps to the left lower extremity, she will place the right lower extremity compression wraps on when she gets home. She will be out of town over the weekend and return next  week and follow-up appointment. She completed her doxycycline this morning 05/03/18 on evaluation today patient appears to be doing very well in regard to her right lateral ankle ulcer in general. At least she's showing some signs of improvement in this regard. Unfortunately she has some additional injury to the left lateral malleolus region which appears to be new likely even over the past several days. Again this determination is based on the overall appearance. With that being said the patient is obviously frustrated about this currently. 05/10/18-She is here in follow-up evaluation for bilateral lateral malleolar ulcers. She states she has purchased offloading shoes/boots and they will arrive tomorrow. She was asked to bring them in the office at next week's appointment so her provider is aware of product being utilized. She continues to sleep on right or left side, she has been encouraged to sleep on her back. The right lateral malleolus ulcer is precariously close to peri-osteum; will order xray. The left lateral malleolus ulcer is improved. Will switch back to santyl; she will follow up next week. 05/17/18 on evaluation today patient actually appears to be doing very well in regard to her malleolus her ulcers compared to last time I saw them. She does not seem to have as much in the way of contusion at this point which is great news. With that being said she does continue to have discomfort and I do believe that she is still continuing to benefit from the offloading/pressure reducing boots that were recommended. I think this is the key to trying to get this to heal up completely. 05/24/18 on evaluation today patient actually appears to be doing worse at this point in time unfortunately compared to her last week's evaluation. She is having really no increased pain which is good news unfortunately she does have more maceration in your theme and noted surrounding the right lateral ankle the left  lateral ankle is not really is erythematous I do not see signs of the overt cellulitis on that side. Unfortunately the wounds do not seem to have shown any signs of improvement since the last evaluation. She also has significant swelling especially on the right compared to previous some of this may be due to infection however also think that she may be served better while she has these wounds by compression wrapping versus continuing to use the Juxta-Lite for the time being. Especially with the amount of drainage that she is experiencing at this point. No fevers, chills, nausea, or vomiting noted at this time. 05/31/18 on evaluation today patient appears to actually be doing better in regard to her right lateral lower extremity ulcer specifically on the malleolus region. She has been tolerating the antibiotic without complication. With that being said she still continues to have issues but a little bit of redness although nothing like  she what she was experiencing previous. She still continues to pressure to her ankle area she did get the problem on offloading boots unfortunately she will not wear them she states there too uncomfortable and she can't get in and out of the bed. Nonetheless at this point her wounds seem to be continually getting worse which is not what we want I'm getting somewhat concerned about her progress and how things are going to proceed if we do not intervene in some way shape or form. I therefore had a very lengthy conversation today about offloading yet again and even made a specific suggestion for switching her to a memory foam mattress and even gave the information for a specific one that they could look at getting if it was something that they were interested in considering. She does not want to be considered for a hospital bed air mattress although honestly insurance would not cover it that she does not have any wounds on her trunk. 06/14/18 on evaluation today both wounds  over the bilateral lateral malleolus her ulcers appear to be doing better there's no evidence of pressure injury at this point. She did get the foam mattress for her bed and this does seem to have been extremely beneficial for her in my pinion. Her daughter states that she is having difficulty getting out of bed because of how soft it is. The patient also relates this to be. Nonetheless I do feel like she's actually doing better. Unfortunately right after and around the time she was getting the mattress she also sustained a fall when she got up to go pick up the phone and ended up injuring her right elbow she has [address] sut[address] place. We are not caring for this currently although home health is going to be taking the sutures out shortly. Nonetheless this may be something that we need to evaluate going forward. It depends on how well it has or has not healed in the end. She also recently saw an orthopedic specialist for an injection in the right shoulder just before her fall unfortunately the fall seems to have worsened her pain. 06/21/18 on evaluation today patient appears to be doing about the same in regard to her lateral malleolus ulcers. Both appear to be just a little bit deeper but again we are clinging away the necrotic and dead tissue which I think is why this is progressing towards a deeper realm as opposed [name], [name]. ([id number]) to improving from my measurement standpoint in that regard. Nonetheless she has been tolerating the dressing changes she absolutely hates the memory foam mattress topper that was obtained for her nonetheless I do believe this is still doing excellent as far as taking care of excess pressure in regard to the lateral malleolus regions. She in fact has no pressure injury that I see whereas in weeks past it was week by week I was constantly seeing new pressure injuries. Overall I think it has been very beneficial for her. 07/03/18; patient arrives in my clinic  today. She has deep punched out areas over her bilateral lateral malleoli. The area on the right has some more depth. We spent a lot of time today talking about pressure relief for these areas. This started when her daughter asked for a prescription for a memory foam mattress. I have never written a prescription for a mattress and I don't think insurances would pay for that on an ordinary bed. In any case he came up that she has foam  boots that she refuses to wear. I would suggest going to these before any other offloading issues when she is in bed. They say she is meticulous about offloading this the rest of the day 07/10/18- She is seen in follow-up evaluation for bilateral, lateral malleolus ulcers. There is no improvement in the ulcers. She has purchased and is sleeping on a memory foam mattress/overlay, she has been using the offloading boots nightly over the past week. She has a follow up appointment with vascular medicine at the end of October, in my opinion this follow up should be expedited given her deterioration and suboptimal TBI results. We will order plain film xray of the left ankle as deeper structures are palpable; would consider having MRI, regardless of xray report(s). The ulcers will be treated with iodoflex/iodosorb, she is unable to safely change the dressings daily with santyl. 07/19/18 on evaluation today patient appears to be doing in general visually well in regard to her bilateral lateral malleolus ulcers. She has been tolerating the dressing changes without complication which is good news. With that being said we did have an x-ray performed on 07/12/18 which revealed a slight loosen see in the lateral portion of the distal left fibula which may represent artifact but underline lytic destruction or osteomyelitis could not be excluded. MRI was recommended. With that being said we can see about getting the patient scheduled for an MRI to further evaluate this area. In fact we have  that scheduled currently for August 20 19,019. 07/26/18 on evaluation today patient's wound on the right lateral ankle actually appears to be doing fairly well at this point in my pinion. She has made some good progress currently. With that being said unfortunately in regard to the left lateral ankle ulcer this seems to be a little bit more problematic at this time. In fact as I further evaluated the situation she actually had bone exposed which is the first time that's been the case in the bone appear to be necrotic. Currently I did review patient's note from Dr. Bunnie Domino office with [facility]. He stated that ABI was 1.26 on the right and 0.95 on the left with good waveforms. Her perfusion is stable not reduced from previous studies and her digital waveforms were pretty good particularly on the right. His conclusion upon review of the note was that there was not much she could do to improve her perfusion and he felt she was adequate for wound healing. His suggestion was that she continued to see [place] and consider a synthetic skin graft if there was no underlying infection. He plans to see her back in six months or as needed. 08/01/18 on evaluation today patient appears to be doing better in regard to her right lateral ankle ulcer. Her left lateral ankle ulcer is about the same she still has bone involvement in evidence of necrosis. There does not appear to be evidence of infection at this time On the right lateral lower extremity. I have started her on the Augmentin she picked this up and started this yesterday. This is to get her through until she sees infectious disease which is scheduled for 08/12/18. 08/06/18 on evaluation today patient appears to be doing rather well considering my discussion with patient's daughter at the end of last week. The area which was marked where she had erythema seems to be improved and this is good news. With that being said overall the patient seems  to be making good improvement when it  comes to the overall appearance of the right lateral ankle ulcer although this has been slow she at least is coming around in this regard. Unfortunately in regard to the left lateral ankle ulcer this is osteomyelitis based on the pathology report as well is bone culture. Nonetheless we are still waiting CT scan. Unfortunately the MRI we originally ordered cannot be performed as the patient is a pacemaker which I had overlooked. Nonetheless we are working on the CT scan approval and scheduling as of now. She did go to the hospital over the weekend and was placed on IV Cefzo for a couple of days. Fortunately this seems to have improved the erythema quite significantly which is good news. There does not appear to be any evidence of worsening infection at this time. She did have some bleeding after the last debridement therefore I did not perform any sharp debridement in regard to left lateral ankle at this point. Patient has been approved for a snap vac for the right lateral ankle. 08/14/18; the patient with wounds over her bilateral lateral malleoli. The area on the right actually looks quite good. Been using a snap back on this area. Healthy granulation and appears to be filling in. Unfortunately the area on the left is really problematic. 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 Also noted to have cellulitis. She saw Dr. Novella Olive of infectious disease today and was put on linezolid. We are able to verify this with her pharmacy. She is completed the Augmentin that she was already on. We've been using Iodoflex to this area 08/23/18 on evaluation today patient's wounds both actually appear to be doing better compared to my prior evaluations. Fortunately she showing signs of good improvement in regard to the overall wound status especially where were using the snap vac on the right. In  regard to left lateral malleolus the wound bed actually appears to be much cleaner than previously noted. I do not feel any phone directly probed during evaluation today and though there is tendon noted this does not appear to be necrotic it's actually fairly good as far as the overall appearance of the tendon is concerned. In general the wound bed actually appears to be doing significantly better than it was previous. Patient is currently in the care of [name] and I did review that note today. He actually has her on two weeks of linezolid and then following the patient will be on 1-2 months of Keflex. That is the plan currently. She has been on antibiotics therapy as prescribed by myself initially starting on July 30, 2018 and has been on that continuously up to this point. 08/30/18 on evaluation today patient actually appears to be doing much better in regard to her right lateral malleolus ulcer. She has been tolerating the dressing changes specifically the snap vac without complication although she did have some issues with the seal currently. Apparently there was some trouble with getting it to maintain over the past week past Sunday. Nonetheless overall the wound appears better in regard to the right lateral malleolus region. In regard to left lateral malleolus this actually show some signs of additional granulation although there still tendon noted in the base of the wound this appears to be healthy not necrotic in any way whatsoever. We are considering potentially using a snap vac for the left lateral malleolus as well the product wrap from [employer], [place], was present in  the clinic today we're going to see this patient I did have her come in with me after obtaining consent from the patient and her daughter in order to look at the wound and see if there's any recommendation one way or another as to whether or not they felt the snapback could be beneficial for the left lateral malleolus region.  But the conclusion was that it might be but that this is definitely a little bit deeper wound than what traditionally would be utilized for a snap vac. 09/06/18 on evaluation today patient actually appears to be doing excellent in my pinion in regard to both ankle ulcers. She has been tolerating the dressing changes without complication which is great news. Specifically we have been using the snap vac. In regard to the right ankle I'm not even sure that this is going to be necessary for today and following as the wound has filled in quite nicely. In regard to the left ankle I do believe [name], [name] ([id number]) that we're seeing excellent epithelialization from the edge as well as granulation in the central portion the tendon is still exposed but there's no evidence of necrotic bone and in general I feel like the patient has made excellent progress even compared to last week with just one week of the snap vac. 09/11/18; this is a patient who has wounds on her bilateral lateral malleoli. Initially both of these were deep stage IV wounds in the setting of chronic arterial insufficiency. She has been revascularized. As I understand think she been using snap vacs to both of these wounds however the area on the right became more superficial and currently she is only using it on the left. Using silver collagen on the right and silver collagen under the back on the left I believe 09/19/18 on evaluation today patient actually appears to be doing very well in regard to her lateral malleolus or ulcers bilaterally. She has been tolerating the dressing changes without complication. Fortunately there does not appear to be any evidence of infection at this time. Overall I feel like she is improving in an excellent manner and I'm very pleased with the fact that everything seems to be turning towards the better for her. This has obviously been a long road. 09/27/18 on evaluation today patient actually appears  to be doing very well in regard to her bilateral lateral malleolus ulcers. She has been tolerating the dressing changes without complication. Fortunately there does not appear to be any evidence of infection at this time which is also great news. No fevers, chills, nausea, or vomiting noted at this time. Overall I feel like she is doing excellent with the snap vac on the left malleolus. She had 40 mL of fluid collection over the past week. 10/04/18 on evaluation today patient actually appears to be doing well in regard to her bilateral lateral malleolus ulcers. She continues to tolerate the dressing changes without complication. One issue that I see is the snap vac on the left lateral malleolus which appears to have sealed off some fluid underlying this area and has not really allowed it to heal to the degree that I would like to see. For that reason I did suggest at this point we may want to pack a small piece of packing strip into this region to allow it to more effectively wick out fluid. 10/11/18 in general the patient today does not feel that she has been doing very well. She's been a little bit lethargic and  subsequently is having bodyaches as well according to what she tells me today. With that being said overall she has been concerned with the fact that something may be worsening although to be honest her wounds really have not been appearing poorly. She does have a new ulcer on her left heel unfortunately. This may be pressure related. Nonetheless it seems to me to have potentially started at least as a blister I do not see any evidence of deep tissue injury. In regard to the left ankle the snap vac still seems to be causing the ceiling off of the deeper part of the wound which is in turn trapping fluid. I'm not extremely pleased with the overall appearance as far as progress from last week to this week therefore I'm gonna discontinue the snap vac at this point. 10/18/18 patient unfortunately  this point has not been feeling well for the past several days. She was seen by Grayland Ormond her primary care provider who is a Librarian, academic at [facility]. Subsequently she states that she's been very weak and generally feeling malaise. No fevers, chills, nausea, or vomiting noted at this time. With that being said bloodwork was performed at the PCP office on the 11th of this month which showed a white blood cell count of 10.7. This was repeated today and shows a white blood cell count of 12.4. This does show signs of worsening. Coupled with the fact that she is feeling worse and that her left ankle wound is not really showing signs of improvement I feel like this is an indication that the osteomyelitis is likely exacerbating not improving. Overall I think we may also want to check her C-reactive protein and sedimentation rate. Actually did call [facility] office this afternoon while the patient was in the office here with me. Subsequently based on the findings we discussed treatment possibilities and I think that it is appropriate for [place] to go ahead and initiate treatment with doxycycline which I'm going to do. Subsequently he did agree to see about adding a CRP and sedimentation rate to her orders. If that has not already been drawn to where they can run it they will contact the patient she can come back to have that check. They are in agreement with plan as far as the patient and her daughter are concerned. Nonetheless also think we need to get in touch with Dr. Henreitta Leber office to see about getting the patient scheduled with him as soon as possible. 11/08/18 on evaluation today patient presents for follow-up concerning her bilateral foot and ankle ulcers. I did do an extensive review of her chart in epic today. Subsequently she was seen by [name] he did initiate Cefepime IV antibiotic therapy. Subsequently she had some issues with her PICC line this had to be removed because it  was coiled and then replaced. Fortunately that was now settled. Unfortunately she has continued have issues with her left heel as well as the issues that she is experiencing with her bilateral lateral malleolus regions. I do believe however both areas seem to be doing a little bit better on evaluation today which is good news. No fevers, chills, nausea, or vomiting noted at this time. She actually has an angiogram schedule with Dr. dew on this coming Monday, November 11, 2018. Subsequently the patient states that she is feeling much better especially than what she was roughly 2 weeks ago. She actually had to cancel an appointment because she was feeling so poorly. No fevers,  chills, nausea, or vomiting noted at this time. 11/15/18 on evaluation today patient actually is status post having had her angiogram with Dr. dew Monday, four days ago. 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 He had to go and work on several areas of the vasculature fortunately he was able to obtain no more than a 30% residual stenosis throughout post procedure. I reviewed this note today. I think this will definitely help with healing at this time. Fortunately there does not appear to be any signs of infection and I do feel like ratio already has a better appearance to it. 11/22/18 upon evaluation today patient actually appears to be doing very well in regard to her wounds in general. The right lateral malleolus looks excellent the heel looks better in the left lateral malleolus also appears to be doing a little better. With that being said the right second toe actually appears to be open and training we been watching this is been dry and stable but now is open. 12/03/2018 Seen today for follow-up and management of multiple bilateral lower extremity wounds. New pressure injury of the great toe which is closed at this time. Wound of the right distal second toe appears larger today with deep undermining  and a pocket of fluid present within the undermining region. Left and right malleolus is wounds are stable today with no signs and symptoms of infection.Denies any needs or concerns during exam today. 12/13/18 on evaluation today patient appears to be doing somewhat better in regard to her left heel ulcer. She also seems to be completely healed in regard to the right lateral malleolus ulcer. The left malleolus ulcer is smaller what unfortunately the wounds which are new over the first and second toes of the right foot are what are most concerning at this point especially the second. Both areas did require sharp debridement today. 12/20/18 on evaluation today patient's wound actually appears to be doing better in regard to left lateral ankle and her right lateral ankle continues to remain healed. The hill ulcer on the left is improved. She does have improvement noted as well in regard to both toe ulcers. Overall I'm very pleased in this regard. No fevers, chills, nausea, or vomiting noted at this time. 12/23/18 on evaluation today patient is seen after she had her toenails trimmed at the podiatrist office due to issues with her right great toe. There was what appeared to be dark eschar on the surface of the wound which had her in the podiatrist concerned. Nonetheless as I remember that during the last office visit I had utilize silver nitrate of this area I was much less concerned about the situation. Subsequently I was able to clean off much of this tissue without any complication today. This does not appear to show any signs of infection and actually look somewhat better [name], [name] ([id number]) compared to last time post debridement. Her second toe on the right foot actually had callous over and there did appear still be some fluid underneath this that would require debridement today. 12/27/18 on evaluation today patient actually appears to be showing signs of improvement at all locations. Even  the left lateral ankle although this is not quite as great as the other sites. Fortunately there does not appear to be any signs of infection at this time and both of her toes on the right foot seem to be showing signs of improvement which is good news and very pleased  in this regard. 01/03/19 on evaluation today patient appears to be doing better for the most part in regard to her wounds in particular. There does not appear to be any evidence of infection at this time which is good news. Fortunately there is no sign of really worsening anywhere except for the right great toe which she does have what appears to be a bruise/deep tissue injury which is very superficial and already resolving. I'm not sure where this came from I questioned her extensively and she does not recall what may have happened with this. Other than that the patient seems to be doing well even the left lateral ankle ulcer looks good and is getting smaller. 01/10/19 on evaluation today patient appears to be doing well in regard to her left heel wound and both of her toe wounds. Overall I feel like there is definitely improvement here and I'm happy in that regard. With that being said unfortunately she is having issues with the left lateral malleolus ulcer which unfortunately still has a lot of depth to it. This is gonna be a very difficult wound for [place] to be able to truly get to heal. I may want to consider some type of skin substitute to see if this would be of benefit for her. I'll discuss this with her more the next visit most likely. This was something I thought about more at the end of the visit when I was Artie out of the room and the patient had been discharged. 01/17/19 on evaluation today patient appears to be doing very well in regard to her wounds in general. She's been making excellent progress at this time. Fortunately there's no sign of infection at this time either. No fevers, chills, nausea, or vomiting noted at this  time. The biggest issue is still her left lateral malleolus where it appears to be doing well and is getting smaller but still shows a small corner where this is deeper and goes down into what appears to be the joint space. Nonetheless this is taking much longer to heal although it still looks better in smaller than previous evaluations. 01/24/19 on evaluation today patient's wounds actually appear to be doing rather well in general overall. She did require some sharp debridement in regard to the right great toe but everything else appears to be doing excellent no debridement was even necessary. No fevers, chills, nausea, or vomiting noted at this time. 01/31/19 on evaluation today patient actually appears to be doing much better in regard to her left foot wound on the heel as well as the ankle. The right great toe appears to be a little bit worse today this had callous over and trapped a lot of fluid underneath. Fortunately there's no signs of infection at any site which is great news. 02/07/19 on evaluation today patient actually appears to be doing decently well in regard to all of her ulcers at this point. No sharp debridement was required she is a little bit of hyper granulation in regard to the left lateral ankle as well as the left heel but the hill itself is almost completely healed which is excellent news. Overall been very pleased in this regard. 02/14/19 on evaluation today patient actually appears to be doing very well in regard to her ulcers on the right first toe, left lateral malleolus, and left heel. In fact the heel is almost completely healed at this point. The patient does not show any signs of infection which is good news. Overall very  pleased with how things have progressed. 04/18/19 Telehealth Evaluation During the COVID-19 National Emergency: Verbal Consent: Obtained from patient Allergies: reviewed and the active list is current. Medication changes: patient has no current  medication changes. COVID-19 Screening: 1. Have you traveled internationally or on a cruise ship in the last 14 dayso No 2. Have you had contact with someone with or under investigation for COVID-19o No 3. Have you had a fever, cough, sore throat, or experiencing shortness of breatho No on evaluation today actually did have a visit with this patient through a telehealth encounter with her home health nurse. Subsequently it was noted that the patient actually appears to be doing okay in regard to her wounds both the right great toe as well as the left lateral malleolus have shown signs of improvement although this in your theme around the left lateral malleolus there eschar coverings for both locations. The question is whether or not they are actually close and whether or not home health needs to discharge the patient or not. Nonetheless my concern is this point obviously is that without actually seeing her and being able to evaluate this directly I cannot ensure that she is completely healed which is the question that I'm being asked. 04/22/19 on evaluation today patient presents for her first evaluation since last time I saw her which was actually February 14, 2019. I did do a telehealth visit last week in which point it was questionable whether or not she may be healed and had to bring her in today for confirmation. With that being said she does seem to be doing quite well at this point which is good news. There does not appear to be any drainage in the deed I believe her wounds may be healed. Readmission: 09/04/2019 on evaluation today patient appears to be doing unfortunately somewhat more poorly in regard to her left foot ulcer secondary to a wound that began on 08/21/2019 at least when she first noticed this. Fortunately she has not had any evidence of active infection at this time. Systemically. I also do not necessarily see any evidence of infection at the blister/wound site on the first  metatarsal head plantar aspect. This almost appears to be something that may have just rubbed inappropriately causing this to breakdown. They did not want a wait too long to come in to be seen as again she had significant issues in the past with wounds that took quite a while to heal in fact it was close to 2 years. Nonetheless this does not appear to be quite that bad but again we do need to remove some of the necrotic tissue from the surface of the wound to tell exactly the extent. She does not appear to have any significant arterial disease at this point and again her last ABIs and TBI's are recorded above in the alert section her left ABI was 1.27 with a TBI of 0.72 to the right ABI 1.08 with a TBI of 0.39. Other than this the patient has been doing quite well since I last saw her and that was in May 2020. 09/11/2019 on evaluation today patient appeared to be doing very well with regard to her plantar foot ulcer on the left. In fact this appears to be almost completely healed which is awesome. That is after just 1 week of intervention. With that being said there is no signs of active infection at this time. 09/18/2019 on evaluation today patient actually appears to be doing excellent in fact she  is completely healed based on what I am seeing at this [name], [name] ([id number]) point. Fortunately there is no signs of active infection at this time and overall patient is very pleased to hear that this area has healed so quickly. Readmission: 05/13/2021 upon evaluation today this patient presents for reevaluation here in the clinic. This is a wound that actually we previously took care of. She had 1 on the right ankle and the left the left turned out to be be harder due to to heal but nonetheless is doing great at this point as the right that has reopened and it was noted first just several weeks ago with a scab over it and came off in just the past few days. Fortunately there does not appear to  be any obvious evidence of significant active infection at this time which is great news. No fevers, chills, nausea, vomiting, or diarrhea. 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 There does not appear to be any signs of this interfering in any way with her wound. She does have swelling we previously had compression socks for her ordered but again it does not look like she wears these on a regular basis by any means. 05/26/2021 upon evaluation today patient appears to be doing well with regard to her wound which is actually showing signs of excellent improvement. There does not appear to be any signs of active infection which is great news and overall very pleased with where things stand today. No fevers, chills, nausea, vomiting, or diarrhea. 06/02/2021 upon evaluation today patient's wound actually showing signs of excellent improvement. Fortunately there does not appear to be any signs of active infection which is great news. I think the patient is making good progress with regard to her wounds in general. 06/09/2021 upon evaluation today patient appears to be doing excellent in regard to her wounds currently. Fortunately there does not appear to be any signs of active infection which is great news. No fevers, chills, nausea, vomiting, or diarrhea. Overall extremely pleased with where things stand today. I think the patient is making excellent progress. 06/16/2021 upon evaluation today patient appears to be doing well in regard to her wound. This is going require little bit of debridement today and that was discussed with the patient. Otherwise she seems to be doing quite well and I am actually very pleased with where things stand at this point. No fevers, chills, nausea, vomiting, or diarrhea. 06/23/2021 upon evaluation today patient appears to be doing well with regard to her wounds. She has been tolerating the dressing changes without complication.  Fortunately there does not appear to be any evidence of infection and she has not had air in her home which she actually lives at an assisted living that got fixed this morning. With that being said because of that her wrap has been extremely hot and bothersome for her over the past week. Electronic Signature(s) Signed: 06/23/2021 11:31:33 AM By: Worthy Keeler PA-C Entered By: Worthy Keeler on 06/23/2021 11:31:33 [name], [name] ([id number]) -------------------------------------------------------------------------------- Physical Exam Details Patient Name: [name] Date of Service: 06/23/2021 11:00 AM Medical Record Number: [id number] Patient Account Number: [id number] Date of Birth/Sex: [birth year] (87 y.o. F) Treating RN: Dolan Amen Primary Care Provider: Adrian Prows Other Clinician: Referring Provider: Adrian Prows Treating Provider/Extender: Skipper Cliche in Treatment: 5 Constitutional Well-nourished and well-hydrated in no acute distress. Respiratory normal breathing without difficulty. Psychiatric this patient is  able to make decisions and demonstrates good insight into disease process. Alert and Oriented x 3. pleasant and cooperative. Notes Upon inspection patient's wound bed actually showed signs of excellent granulation epithelization at this point. This is feeling quite nicely and seems to be healing extremely well Happy with the collagen and I am good recommend that we actually continue as such with the current measures. Electronic Signature(s) Signed: 06/23/2021 11:32:29 AM By: Worthy Keeler PA-C Entered By: Worthy Keeler on 06/23/2021 11:32:29 [name], [name] ([id number]) -------------------------------------------------------------------------------- Physician Orders Details Patient Name: [name] Date of Service: 06/23/2021 11:00 AM Medical Record Number: [id number] Patient Account Number: [id number] Date of Birth/Sex:  Aug 20, 1923 (87 y.o. F) Treating RN: Carlene Coria Primary Care Provider: Adrian Prows Other Clinician: Referring Provider: Adrian Prows Treating Provider/Extender: Skipper Cliche in Treatment: 5 Verbal / Phone Orders: No Diagnosis Coding ICD-10 Coding Code Description 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, stage 3 I89.0 Lymphedema, not elsewhere classified I87.2 Venous insufficiency (chronic) (peripheral) Z95.0 Presence of cardiac pacemaker Z79.01 Long term (current) use of anticoagulants Follow-up Appointments o Return Appointment in 1 week. o Nurse Visit as needed Bathing/ Shower/ Hygiene o May shower with wound dressing protected with water repellent cover or cast protector. Edema Control - Lymphedema / Segmental Compressive Device / Other o Optional: One layer of unna paste to top of compression wrap (to act as an anchor). o Elevate, Exercise Daily and Avoid Standing for Long Periods of Time. o Elevate legs to the level of the heart and pump ankles as often as possible o Elevate leg(s) parallel to the floor when sitting. Wound Treatment Wound #7 - Malleolus Wound Laterality: Right, Lateral Cleanser: Soap and Water 1 x Per Week/30 Days Discharge Instructions: Gently cleanse wound with antibacterial soap, rinse and pat dry prior to dressing wounds Primary Dressing: Prisma 4.34 (in) 1 x Per Week/30 Days Discharge Instructions: Moisten w/normal saline or sterile water; Cover wound as directed. Do not remove from wound bed. Secondary Dressing: ABD Pad 5x9 (in/in) 1 x Per Week/30 Days Discharge Instructions: Cover with ABD pad Compression Wrap: Profore Lite LF 3 Multilayer Compression Bandaging System 1 x Per Week/30 Days Discharge Instructions: Apply 3 multi-layer wrap as prescribed. Electronic Signature(s) Signed: 06/23/2021 4:17:32 PM By: Carlene Coria RN Signed: 06/23/2021 5:27:10 PM By: Worthy Keeler PA-C Entered By: Carlene Coria on 06/23/2021  11:40:10 [name], [name] ([id number]) -------------------------------------------------------------------------------- Problem List Details Patient Name: [name] Date of Service: 06/23/2021 11:00 AM Medical Record Number: [id number] Patient Account Number: [id number] Date of Birth/Sex: [birth year] (87 y.o. F) Treating RN: Dolan Amen Primary Care Provider: Adrian Prows Other Clinician: Referring Provider: Adrian Prows Treating Provider/Extender: Skipper Cliche in Treatment: 5 Active Problems ICD-10 Encounter Code Description Active Date MDM Diagnosis 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, stage 3 05/13/2021 No Yes I89.0 Lymphedema, not elsewhere classified 05/13/2021 No Yes I87.2 Venous insufficiency (chronic) (peripheral) 05/13/2021 No Yes Z95.0 Presence of cardiac pacemaker 05/13/2021 No Yes Z79.01 Long term (current) use of anticoagulants 05/13/2021 No Yes Inactive Problems Resolved Problems Electronic Signature(s) Signed: 06/23/2021 11:17:33 AM By: Worthy Keeler PA-C Entered By: Worthy Keeler on 06/23/2021 11:17:32 [name], [name] ([id number]) -------------------------------------------------------------------------------- Progress Note Details Patient Name: [name] Date of Service: 06/23/2021 11:00 AM Medical Record Number: [id number] Patient Account Number: [id number] Date of Birth/Sex: 10/02/1923 (87 y.o. 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 Dr. Amalia Hailey the podiatrist saw her on  07/20/2017 and sent her to the wound center for possible hyperbaric oxygen therapy. past medical history of peripheral vascular disease, varicose veins, status post appendectomy, basal cell carcinoma excision from the left leg, cholecystectomy, pacemaker placement, right lower extremity angiography done by Dr. dew in March 2017 with placement of a stent. there is also note of a successful ablation of the right small saphenous vein done which was reviewed by ultrasound on 10/24/2016. the patient had a right small saphenous vein ablation done on 10/20/2016. The patient has never been a smoker. She has been seen by Dr. Corene Cornea dew the vascular surgeon who most recently saw her on 06/15/2017 for evaluation of ongoing problems with right leg swelling. She had a lower extremity arterial duplex examination done(02/13/17) which showed patent distal right superficial femoral artery stent and above-the-knee popliteal stent without evidence of restenosis. The ABI was more than 1.3 on the right and more than 1.3 on the left. This was consistent with noncompressible arteries due to medial calcification. The right great toe pressure and PPG waveforms are within normal limits and the left great toe pressure and PPG waveforms are decreased. he recommended she continue to wear her compression stockings and continue with elevation. She is scheduled to have a noninvasive arterial study in the near future 08/16/2017 -- had a lower extremity arterial duplex examination done which showed patent distal right superficial femoral artery stent and above-the-knee popliteal stent without evidence of restenosis. The ABI was more than 1.3 on the right and more than 1.3 on the left. This was consistent with noncompressible arteries due to medial calcification. The right great toe pressure and PPG waveforms are within normal limits and the left great toe pressure and PPG waveforms are decreased. the x-ray of the right ankle has not  yet been done 08/24/2017 -- had a right ankle x-ray -- IMPRESSION:1. No fracture, bone lesion or evidence of osteomyelitis. 2. Lateral soft tissue swelling with a soft tissue ulcer. she has not yet seen the vascular surgeon for review 08/31/17 on evaluation today patient's wound appears to be showing signs of improvement. She still with her appointment with vascular in order to review her results of her vascular study and then determine if any intervention would be recommended at that time. No fevers, chills, nausea, or vomiting noted at this time. She has been tolerating the dressing changes without complication. 09/28/17 on evaluation today patient's wound appears to show signs of good improvement in regard to the granulation tissue which is surfacing. There is still a layer of slough covering the wound and the posterior portion is still significantly deeper than the anterior nonetheless there has been some good sign of things moving towards the better. She is going to go back to Dr. dew for reevaluation to ensure her blood flow is still appropriate. That will be before her next evaluation with [place] next week. No fevers, chills, nausea, or vomiting noted at this time. Patient does have some discomfort rated to be a 3-4/10 depending on activity specifically cleansing the wound makes it worse. 10/05/2017 -- the patient was seen by Dr. Lucky Cowboy last week and noninvasive studies showed a normal right ABI with brisk triphasic waveforms consistent with  no arterial insufficiency including normal digital pressures. The duplex showed a patent distal right SFA stent and the proximal SFA was also normal. He was pleased with her test and thought she should have enough of perfusion for normal wound healing. He would see her back in 6 months time. 12/21/17 on evaluation today patient appears to be doing fairly well in regard to her right lateral ankle wound. Unfortunately the main issue that she is expansion at this  point is that she is having some issues with what appears to be some cellulitis in the right anterior shin. She has also been noting a little bit of uncomfortable feeling especially last night and her ankle area. I'm afraid that she made the developing a little bit of an infection. With that being said I think it is in the early stages. 12/28/17 on evaluation today patient's ankle appears to be doing excellent. She's making good progress at this point the cellulitis seems to have improved after last week's evaluation. Overall she is having no significant discomfort which is excellent news. She does have an appointment with Dr. dew on March 29, 2018 for reevaluation in regard to the stent he placed. She seems to have excellent blood flow in the right lower extremity. 01/19/12 on evaluation today patient's wound appears to be doing very well. In fact she does not appear to require debridement at this point, there's no evidence of infection, and overall from the standpoint of the wound she seems to be doing very well. With that being said I believe that it may be time to switch to different dressing away from the [facility] Dressing she tells me she does have a lot going on her friend actually passed away yesterday and she's also having a lot of issues with her husband this obviously is weighing heavy on her as far as your thoughts and concerns today. 01/25/18 on evaluation today patient appears to be doing fairly well in regard to her right lateral malleolus. She has been tolerating the dressing changes without complication. Overall I feel like this is definitely showing signs of improvement as far as how the overall appearance of the wound is there's also evidence of epithelium start to migrate over the granulation tissue. In general I think that she is progressing nicely as far as the wound is concerned. The only concern she really has is whether or not we can switch to every other week visits in  order to avoid having as many BERNIECE, ABID ([id number]) appointments as her daughters have a difficult time getting her to her appointments as well as the patient's husband to his he is not doing very well at this point. 02/22/18 on evaluation today patient's right lateral malleolus ulcer appears to be doing great. She has been tolerating the dressing changes without complication. Overall you making excellent progress at this time. Patient is having no significant discomfort. 03/15/18 on evaluation today patient appears to be doing much more poorly in regard to her right lateral ankle ulcer at this point. Unfortunately since have last seen her her husband has passed just a few days ago is obviously weighed heavily on her her daughter also had surgery well she is with her today as usual. There does not appear to be any evidence of infection she does seem to have significant contusion/deep tissue injury to the right lateral malleolus which was not noted previous when I saw her last. It's hard to tell of exactly when this injury occurred although during  the time she was spending the night in the hospital this may have been most likely. 03/22/18 on evaluation today patient appears to actually be doing very well in regard to her ulcer. She did unfortunately have a setback which was noted last week however the good news is we seem to be getting back on track and in fact the wound in the core did still have some necrotic tissue which will be addressed at this point today but in general I'm seeing signs that things are on the up and up. She is glad to hear this obviously she's been somewhat concerned that due to the how her wound digressed more recently. 03/29/18 on evaluation today patient appears to be doing fairly well in regard to her right lower extremity lateral malleolus ulcer. She unfortunately does have a new area of pressure injury over the inferior portion where the wound has opened up a little  bit larger secondary to the pressure she seems to be getting. She does tell me sometimes when she sleeps at night that it actually hurts and does seem to be pushing on the area little bit more unfortunately. There does not appear to be any evidence of infection which is good news. She has been tolerating the dressing changes without complication. She also did have some bruising in the left second and third toes due to the fact that she may have bump this or injured it although she has neuropathy so she does not feel she did move recently that may have been where this came from. Nonetheless there does not appear to be any evidence of infection at this time. 04/12/18 on evaluation today patient's wound on the right lateral ankle actually appears to be doing a little bit better with a lot of necrotic docking tissue centrally loosening up in clearing away. However she does have the beginnings of a deep tissue injury on the left lateral malleolus likely due to the fact we've been trying offload the right as much as we have. I think she may benefit from an assistive soft device to help with offloading and it looks like they're looking at one of the doughnut conditions that wraps around the lower leg to offload which I think will definitely do a good job. With that being said I think we definitely need to address this issue on the left before it becomes a wound. Patient is not having significant pain. 04/19/18 on evaluation today patient appears to be doing excellent in regard to the progress she's made with her right lateral ankle ulcer. The left ankle region which did show evidence of a deep tissue injury seems to be resolving there's little fluid noted underneath and a blister there's nothing open at this point in time overall I feel like this is progressing nicely which is good news. She does not seem to be having significant discomfort at this point which is also good news. 04/25/18-She is here in follow  up evaluation for bilateral lateral malleolar ulcers. The right lateral malleolus ulcer with pale subcutaneous tissue exposure, central area of ulcer with tendon/periosteum exposed. The left lateral malleolus ulcer now with central area of nonviable tissue, otherwise deep tissue injury. She is wearing compression wraps to the left lower extremity, she will place the right lower extremity compression wraps on when she gets home. She will be out of town over the weekend and return next week and follow-up appointment. She completed her doxycycline this morning 05/03/18 on evaluation today patient appears to be  doing very well in regard to her right lateral ankle ulcer in general. At least she's showing some signs of improvement in this regard. Unfortunately she has some additional injury to the left lateral malleolus region which appears to be new likely even over the past several days. Again this determination is based on the overall appearance. With that being said the patient is obviously frustrated about this currently. 05/10/18-She is here in follow-up evaluation for bilateral lateral malleolar ulcers. She states she has purchased offloading shoes/boots and they will arrive tomorrow. She was asked to bring them in the office at next week's appointment so her provider is aware of product being utilized. She continues to sleep on right or left side, she has been encouraged to sleep on her back. The right lateral malleolus ulcer is precariously close to peri-osteum; will order xray. The left lateral malleolus ulcer is improved. Will switch back to santyl; she will follow up next week. 05/17/18 on evaluation today patient actually appears to be doing very well in regard to her malleolus her ulcers compared to last time I saw them. She does not seem to have as much in the way of contusion at this point which is great news. With that being said she does continue to have discomfort and I do believe that she is  still continuing to benefit from the offloading/pressure reducing boots that were recommended. I think this is the key to trying to get this to heal up completely. 05/24/18 on evaluation today patient actually appears to be doing worse at this point in time unfortunately compared to her last week's evaluation. She is having really no increased pain which is good news unfortunately she does have more maceration in your theme and noted surrounding the right lateral ankle the left lateral ankle is not really is erythematous I do not see signs of the overt cellulitis on that side. Unfortunately the wounds do not seem to have shown any signs of improvement since the last evaluation. She also has significant swelling especially on the right compared to previous some of this may be due to infection however also think that she may be served better while she has these wounds by compression wrapping versus continuing to use the Juxta-Lite for the time being. Especially with the amount of drainage that she is experiencing at this point. No fevers, chills, nausea, or vomiting noted at this time. 05/31/18 on evaluation today patient appears to actually be doing better in regard to her right lateral lower extremity ulcer specifically on the malleolus region. She has been tolerating the antibiotic without complication. With that being said she still continues to have issues but a little bit of redness although nothing like she what she was experiencing previous. She still continues to pressure to her ankle area she did get the problem on offloading boots unfortunately she will not wear them she states there too uncomfortable and she can't get in and out of the bed. Nonetheless at this point her wounds seem to be continually getting worse which is not what we want I'm getting somewhat concerned about her progress and how things are going to proceed if we do not intervene in some way shape or form. I therefore had a very  lengthy conversation today about offloading yet again and even made a specific suggestion for switching her to a memory foam mattress and even gave the information for a specific one that they could look at getting if it was something that they were  interested in considering. She does not want to be considered for a hospital bed air mattress although honestly insurance would not cover it that she does not have any wounds on her trunk. 06/14/18 on evaluation today both wounds over the bilateral lateral malleolus her ulcers appear to be doing better there's no evidence of pressure injury at this point. She did get the foam mattress for her bed and this does seem to have been extremely beneficial for her in my pinion. Her daughter states that she is having difficulty getting out of bed because of how soft it is. The patient also relates this to be. Nonetheless I do feel like she's actually doing better. Unfortunately right after and around the time she was getting the mattress she also sustained a fall when she got up to go pick up the phone and ended up injuring her right elbow she has 18 sutures in place. We are not caring for this currently although home health is going to be taking the sutures out shortly. Nonetheless this may be something that we need to evaluate going forward. It depends on RYANNA, TESCHNER ([id number]) how well it has or has not healed in the end. She also recently saw an orthopedic specialist for an injection in the right shoulder just before her fall unfortunately the fall seems to have worsened her pain. 06/21/18 on evaluation today patient appears to be doing about the same in regard to her lateral malleolus ulcers. Both appear to be just a little bit deeper but again we are clinging away the necrotic and dead tissue which I think is why this is progressing towards a deeper realm as opposed to improving from my measurement standpoint in that regard. Nonetheless she has been  tolerating the dressing changes she absolutely hates the memory foam mattress topper that was obtained for her nonetheless I do believe this is still doing excellent as far as taking care of excess pressure in regard to the lateral malleolus regions. She in fact has no pressure injury that I see whereas in weeks past it was week by week I was constantly seeing new pressure injuries. Overall I think it has been very beneficial for her. 07/03/18; patient arrives in my clinic today. She has deep punched out areas over her bilateral lateral malleoli. The area on the right has some more depth. We spent a lot of time today talking about pressure relief for these areas. This started when her daughter asked for a prescription for a memory foam mattress. I have never written a prescription for a mattress and I don't think insurances would pay for that on an ordinary bed. In any case he came up that she has foam boots that she refuses to wear. I would suggest going to these before any other offloading issues when she is in bed. They say she is meticulous about offloading this the rest of the day 07/10/18- She is seen in follow-up evaluation for bilateral, lateral malleolus ulcers. There is no improvement in the ulcers. She has purchased and is sleeping on a memory foam mattress/overlay, she has been using the offloading boots nightly over the past week. She has a follow up appointment with vascular medicine at the end of October, in my opinion this follow up should be expedited given her deterioration and suboptimal TBI results. We will order plain film xray of the left ankle as deeper structures are palpable; would consider having MRI, regardless of xray report(s). The ulcers will be treated with  iodoflex/iodosorb, she is unable to safely change the dressings daily with santyl. 07/19/18 on evaluation today patient appears to be doing in general visually well in regard to her bilateral lateral malleolus ulcers. She  has been tolerating the dressing changes without complication which is good news. With that being said we did have an x-ray performed on 07/12/18 which revealed a slight loosen see in the lateral portion of the distal left fibula which may represent artifact but underline lytic destruction or osteomyelitis could not be excluded. MRI was recommended. With that being said we can see about getting the patient scheduled for an MRI to further evaluate this area. In fact we have that scheduled currently for August 20 19,019. 07/26/18 on evaluation today patient's wound on the right lateral ankle actually appears to be doing fairly well at this point in my pinion. She has made some good progress currently. With that being said unfortunately in regard to the left lateral ankle ulcer this seems to be a little bit more problematic at this time. In fact as I further evaluated the situation she actually had bone exposed which is the first time that's been the case in the bone appear to be necrotic. Currently I did review patient's note from Dr. Bunnie Domino office with [facility]. He stated that ABI was 1.26 on the right and 0.95 on the left with good waveforms. Her perfusion is stable not reduced from previous studies and her digital waveforms were pretty good particularly on the right. His conclusion upon review of the note was that there was not much she could do to improve her perfusion and he felt she was adequate for wound healing. His suggestion was that she continued to see [place] and consider a synthetic skin graft if there was no underlying infection. He plans to see her back in six months or as needed. 08/01/18 on evaluation today patient appears to be doing better in regard to her right lateral ankle ulcer. Her left lateral ankle ulcer is about the same she still has bone involvement in evidence of necrosis. There does not appear to be evidence of infection at this time On the right  lateral lower extremity. I have started her on the Augmentin she picked this up and started this yesterday. This is to get her through until she sees infectious disease which is scheduled for 08/12/18. 08/06/18 on evaluation today patient appears to be doing rather well considering my discussion with patient's daughter at the end of last week. The area which was marked where she had erythema seems to be improved and this is good news. With that being said overall the patient seems to be making good improvement when it comes to the overall appearance of the right lateral ankle ulcer although this has been slow she at least is coming around in this regard. Unfortunately in regard to the left lateral ankle ulcer this is osteomyelitis based on the pathology report as well is bone culture. Nonetheless we are still waiting CT scan. Unfortunately the MRI we originally ordered cannot be performed as the patient is a pacemaker which I had overlooked. Nonetheless we are working on the CT scan approval and scheduling as of now. She did go to the hospital over the weekend and was placed on IV Cefzo for a couple of days. Fortunately this seems to have improved the erythema quite significantly which is good news. There does not appear to be any evidence of worsening infection at this time. She  did have some bleeding after the last debridement therefore I did not perform any sharp debridement in regard to left lateral ankle at this point. Patient has been approved for a snap vac for the right lateral ankle. 08/14/18; the patient with wounds over her bilateral lateral malleoli. The area on the right actually looks quite good. Been using a snap back on this area. Healthy granulation and appears to be filling in. Unfortunately the area on the left is really problematic. 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 Also noted to have cellulitis.  She saw Dr. Novella Olive of infectious disease today and was put on linezolid. We are able to verify this with her pharmacy. She is completed the Augmentin that she was already on. We've been using Iodoflex to this area 08/23/18 on evaluation today patient's wounds both actually appear to be doing better compared to my prior evaluations. Fortunately she showing signs of good improvement in regard to the overall wound status especially where were using the snap vac on the right. In regard to left lateral malleolus the wound bed actually appears to be much cleaner than previously noted. I do not feel any phone directly probed during evaluation today and though there is tendon noted this does not appear to be necrotic it's actually fairly good as far as the overall appearance of the tendon is concerned. In general the wound bed actually appears to be doing significantly better than it was previous. Patient is currently in the care of [name] and I did review that note today. He actually has her on two weeks of linezolid and then following the patient will be on 1-2 months of Keflex. That is the plan currently. She has been on antibiotics therapy as prescribed by myself initially starting on July 30, 2018 and has been on that continuously up to this point. 08/30/18 on evaluation today patient actually appears to be doing much better in regard to her right lateral malleolus ulcer. She has been tolerating the dressing changes specifically the snap vac without complication although she did have some issues with the seal currently. Apparently there was some trouble with getting it to maintain over the past week past Sunday. Nonetheless overall the wound appears better in regard to the right lateral malleolus region. In regard to left lateral malleolus this actually show some signs of additional granulation although there still tendon noted in the base of the wound this appears to be healthy not necrotic in any  way whatsoever. We are considering potentially using a snap vac for the left lateral malleolus as well the product wrap from [employer], [name], was present in the clinic today we're going to see this patient I did have her come in with me after obtaining consent from the patient and her daughter in order to look at the wound and see if there's any recommendation one way or another as to whether or not they felt the snapback could be beneficial for the left lateral malleolus region. But HELEENA, MICELI ([id number]) the conclusion was that it might be but that this is definitely a little bit deeper wound than what traditionally would be utilized for a snap vac. 09/06/18 on evaluation today patient actually appears to be doing excellent in my pinion in regard to both ankle ulcers. She has been tolerating the dressing changes without complication which is great news. Specifically we have been using the snap vac. In  regard to the right ankle I'm not even sure that this is going to be necessary for today and following as the wound has filled in quite nicely. In regard to the left ankle I do believe that we're seeing excellent epithelialization from the edge as well as granulation in the central portion the tendon is still exposed but there's no evidence of necrotic bone and in general I feel like the patient has made excellent progress even compared to last week with just one week of the snap vac. 09/11/18; this is a patient who has wounds on her bilateral lateral malleoli. Initially both of these were deep stage IV wounds in the setting of chronic arterial insufficiency. She has been revascularized. As I understand think she been using snap vacs to both of these wounds however the area on the right became more superficial and currently she is only using it on the left. Using silver collagen on the right and silver collagen under the back on the left I believe 09/19/18 on evaluation today patient actually  appears to be doing very well in regard to her lateral malleolus or ulcers bilaterally. She has been tolerating the dressing changes without complication. Fortunately there does not appear to be any evidence of infection at this time. Overall I feel like she is improving in an excellent manner and I'm very pleased with the fact that everything seems to be turning towards the better for her. This has obviously been a long road. 09/27/18 on evaluation today patient actually appears to be doing very well in regard to her bilateral lateral malleolus ulcers. She has been tolerating the dressing changes without complication. Fortunately there does not appear to be any evidence of infection at this time which is also great news. No fevers, chills, nausea, or vomiting noted at this time. Overall I feel like she is doing excellent with the snap vac on the left malleolus. She had 40 mL of fluid collection over the past week. 10/04/18 on evaluation today patient actually appears to be doing well in regard to her bilateral lateral malleolus ulcers. She continues to tolerate the dressing changes without complication. One issue that I see is the snap vac on the left lateral malleolus which appears to have sealed off some fluid underlying this area and has not really allowed it to heal to the degree that I would like to see. For that reason I did suggest at this point we may want to pack a small piece of packing strip into this region to allow it to more effectively wick out fluid. 10/11/18 in general the patient today does not feel that she has been doing very well. She's been a little bit lethargic and subsequently is having bodyaches as well according to what she tells me today. With that being said overall she has been concerned with the fact that something may be worsening although to be honest her wounds really have not been appearing poorly. She does have a new ulcer on her left heel unfortunately. This may be  pressure related. Nonetheless it seems to me to have potentially started at least as a blister I do not see any evidence of deep tissue injury. In regard to the left ankle the snap vac still seems to be causing the ceiling off of the deeper part of the wound which is in turn trapping fluid. I'm not extremely pleased with the overall appearance as far as progress from last week to this week therefore I'm gonna discontinue the  snap vac at this point. 10/18/18 patient unfortunately this point has not been feeling well for the past several days. She was seen by Grayland Ormond her primary care provider who is a Librarian, academic at [facility]. Subsequently she states that she's been very weak and generally feeling malaise. No fevers, chills, nausea, or vomiting noted at this time. With that being said bloodwork was performed at the PCP office on the 11th of this month which showed a white blood cell count of 10.7. This was repeated today and shows a white blood cell count of 12.4. This does show signs of worsening. Coupled with the fact that she is feeling worse and that her left ankle wound is not really showing signs of improvement I feel like this is an indication that the osteomyelitis is likely exacerbating not improving. Overall I think we may also want to check her C-reactive protein and sedimentation rate. Actually did call [facility] office this afternoon while the patient was in the office here with me. Subsequently based on the findings we discussed treatment possibilities and I think that it is appropriate for [place] to go ahead and initiate treatment with doxycycline which I'm going to do. Subsequently he did agree to see about adding a CRP and sedimentation rate to her orders. If that has not already been drawn to where they can run it they will contact the patient she can come back to have that check. They are in agreement with plan as far as the patient and her daughter are  concerned. Nonetheless also think we need to get in touch with Dr. Henreitta Leber office to see about getting the patient scheduled with him as soon as possible. 11/08/18 on evaluation today patient presents for follow-up concerning her bilateral foot and ankle ulcers. I did do an extensive review of her chart in epic today. Subsequently she was seen by [name] he did initiate Cefepime IV antibiotic therapy. Subsequently she had some issues with her PICC line this had to be removed because it was coiled and then replaced. Fortunately that was now settled. Unfortunately she has continued have issues with her left heel as well as the issues that she is experiencing with her bilateral lateral malleolus regions. I do believe however both areas seem to be doing a little bit better on evaluation today which is good news. No fevers, chills, nausea, or vomiting noted at this time. She actually has an angiogram schedule with Dr. dew on this coming Monday, November 11, 2018. Subsequently the patient states that she is feeling much better especially than what she was roughly 2 weeks ago. She actually had to cancel an appointment because she was feeling so poorly. No fevers, chills, nausea, or vomiting noted at this time. 11/15/18 on evaluation today patient actually is status post having had her angiogram with Dr. dew Monday, four days ago. 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 He had to go and work on several areas of the vasculature fortunately he was able to obtain no more than a 30% residual stenosis throughout post procedure. I reviewed this note today. I think this will definitely help with healing at this time. Fortunately there does not appear to be any signs of infection and I do feel like ratio already has a better appearance to it. 11/22/18 upon evaluation today patient actually appears to be doing very well in regard to her wounds in general. The right lateral malleolus looks  excellent  the heel looks better in the left lateral malleolus also appears to be doing a little better. With that being said the right second toe actually appears to be open and training we been watching this is been dry and stable but now is open. 12/03/2018 Seen today for follow-up and management of multiple bilateral lower extremity wounds. New pressure injury of the great toe which is closed at this time. Wound of the right distal second toe appears larger today with deep undermining and a pocket of fluid present within the undermining region. Left and right malleolus is wounds are stable today with no signs and symptoms of infection.Denies any needs or concerns during exam today. 12/13/18 on evaluation today patient appears to be doing somewhat better in regard to her left heel ulcer. She also seems to be completely healed in regard to the right lateral malleolus ulcer. The left malleolus ulcer is smaller what unfortunately the wounds which are new over the first and second toes of the right foot are what are most concerning at this point especially the second. Both areas did require sharp debridement today. 12/20/18 on evaluation today patient's wound actually appears to be doing better in regard to left lateral ankle and her right lateral ankle continues to remain healed. The hill ulcer on the left is improved. She does have improvement noted as well in regard to both toe ulcers. Overall I'm very pleased in this regard. No fevers, chills, nausea, or vomiting noted at this time. [name], [name] ([id number]) 12/23/18 on evaluation today patient is seen after she had her toenails trimmed at the podiatrist office due to issues with her right great toe. There was what appeared to be dark eschar on the surface of the wound which had her in the podiatrist concerned. Nonetheless as I remember that during the last office visit I had utilize silver nitrate of this area I was much less concerned about the  situation. Subsequently I was able to clean off much of this tissue without any complication today. This does not appear to show any signs of infection and actually look somewhat better compared to last time post debridement. Her second toe on the right foot actually had callous over and there did appear still be some fluid underneath this that would require debridement today. 12/27/18 on evaluation today patient actually appears to be showing signs of improvement at all locations. Even the left lateral ankle although this is not quite as great as the other sites. Fortunately there does not appear to be any signs of infection at this time and both of her toes on the right foot seem to be showing signs of improvement which is good news and very pleased in this regard. 01/03/19 on evaluation today patient appears to be doing better for the most part in regard to her wounds in particular. There does not appear to be any evidence of infection at this time which is good news. Fortunately there is no sign of really worsening anywhere except for the right great toe which she does have what appears to be a bruise/deep tissue injury which is very superficial and already resolving. I'm not sure where this came from I questioned her extensively and she does not recall what may have happened with this. Other than that the patient seems to be doing well even the left lateral ankle ulcer looks good and is getting smaller. 01/10/19 on evaluation today patient appears to be doing well in regard to her left heel wound  and both of her toe wounds. Overall I feel like there is definitely improvement here and I'm happy in that regard. With that being said unfortunately she is having issues with the left lateral malleolus ulcer which unfortunately still has a lot of depth to it. This is gonna be a very difficult wound for [place] to be able to truly get to heal. I may want to consider some type of skin substitute to see if this  would be of benefit for her. I'll discuss this with her more the next visit most likely. This was something I thought about more at the end of the visit when I was Artie out of the room and the patient had been discharged. 01/17/19 on evaluation today patient appears to be doing very well in regard to her wounds in general. She's been making excellent progress at this time. Fortunately there's no sign of infection at this time either. No fevers, chills, nausea, or vomiting noted at this time. The biggest issue is still her left lateral malleolus where it appears to be doing well and is getting smaller but still shows a small corner where this is deeper and goes down into what appears to be the joint space. Nonetheless this is taking much longer to heal although it still looks better in smaller than previous evaluations. 01/24/19 on evaluation today patient's wounds actually appear to be doing rather well in general overall. She did require some sharp debridement in regard to the right great toe but everything else appears to be doing excellent no debridement was even necessary. No fevers, chills, nausea, or vomiting noted at this time. 01/31/19 on evaluation today patient actually appears to be doing much better in regard to her left foot wound on the heel as well as the ankle. The right great toe appears to be a little bit worse today this had callous over and trapped a lot of fluid underneath. Fortunately there's no signs of infection at any site which is great news. 02/07/19 on evaluation today patient actually appears to be doing decently well in regard to all of her ulcers at this point. No sharp debridement was required she is a little bit of hyper granulation in regard to the left lateral ankle as well as the left heel but the hill itself is almost completely healed which is excellent news. Overall been very pleased in this regard. 02/14/19 on evaluation today patient actually appears to be doing  very well in regard to her ulcers on the right first toe, left lateral malleolus, and left heel. In fact the heel is almost completely healed at this point. The patient does not show any signs of infection which is good news. Overall very pleased with how things have progressed. 04/18/19 Telehealth Evaluation During the COVID-19 National Emergency: Verbal Consent: Obtained from patient Allergies: reviewed and the active list is current. Medication changes: patient has no current medication changes. COVID-19 Screening: 1. Have you traveled internationally or on a cruise ship in the last 14 dayso No 2. Have you had contact with someone with or under investigation for COVID-19o No 3. Have you had a fever, cough, sore throat, or experiencing shortness of breatho No on evaluation today actually did have a visit with this patient through a telehealth encounter with her home health nurse. Subsequently it was noted that the patient actually appears to be doing okay in regard to her wounds both the right great toe as well as the left lateral malleolus have shown signs  of improvement although this in your theme around the left lateral malleolus there eschar coverings for both locations. The question is whether or not they are actually close and whether or not home health needs to discharge the patient or not. Nonetheless my concern is this point obviously is that without actually seeing her and being able to evaluate this directly I cannot ensure that she is completely healed which is the question that I'm being asked. 04/22/19 on evaluation today patient presents for her first evaluation since last time I saw her which was actually February 14, 2019. I did do a telehealth visit last week in which point it was questionable whether or not she may be healed and had to bring her in today for confirmation. With that being said she does seem to be doing quite well at this point which is good news. There does not  appear to be any drainage in the deed I believe her wounds may be healed. Readmission: 09/04/2019 on evaluation today patient appears to be doing unfortunately somewhat more poorly in regard to her left foot ulcer secondary to a wound that began on 08/21/2019 at least when she first noticed this. Fortunately she has not had any evidence of active infection at this time. Systemically. I also do not necessarily see any evidence of infection at the blister/wound site on the first metatarsal head plantar aspect. This almost appears to be something that may have just rubbed inappropriately causing this to breakdown. They did not want a wait too long to come in to be seen as again she had significant issues in the past with wounds that took quite a while to heal in fact it was close to 2 years. Nonetheless this does not appear to be quite that bad but again we do need to remove some of the necrotic tissue from the surface of the wound to tell exactly the extent. She does not appear to have any significant arterial disease at this point and again her last ABIs and TBI's are recorded above in the alert section her left ABI was 1.27 with a TBI of 0.72 to the right ABI 1.08 with a TBI of 0.39. Other than this the patient has been doing quite well since I last saw her and that was in May 2020. ANAJULIA, LEYENDECKER ([id number]) 09/11/2019 on evaluation today patient appeared to be doing very well with regard to her plantar foot ulcer on the left. In fact this appears to be almost completely healed which is awesome. That is after just 1 week of intervention. With that being said there is no signs of active infection at this time. 09/18/2019 on evaluation today patient actually appears to be doing excellent in fact she is completely healed based on what I am seeing at this point. Fortunately there is no signs of active infection at this time and overall patient is very pleased to hear that this area has healed so  quickly. Readmission: 05/13/2021 upon evaluation today this patient presents for reevaluation here in the clinic. This is a wound that actually we previously took care of. She had 1 on the right ankle and the left the left turned out to be be harder due to to heal but nonetheless is doing great at this point as the right that has reopened and it was noted first just several weeks ago with a scab over it and came off in just the past few days. Fortunately there does not appear to be any obvious  evidence of significant active infection at this time which is great news. No fevers, chills, nausea, vomiting, or diarrhea. 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 There does not appear to be any signs of this interfering in any way with her wound. She does have swelling we previously had compression socks for her ordered but again it does not look like she wears these on a regular basis by any means. 05/26/2021 upon evaluation today patient appears to be doing well with regard to her wound which is actually showing signs of excellent improvement. There does not appear to be any signs of active infection which is great news and overall very pleased with where things stand today. No fevers, chills, nausea, vomiting, or diarrhea. 06/02/2021 upon evaluation today patient's wound actually showing signs of excellent improvement. Fortunately there does not appear to be any signs of active infection which is great news. I think the patient is making good progress with regard to her wounds in general. 06/09/2021 upon evaluation today patient appears to be doing excellent in regard to her wounds currently. Fortunately there does not appear to be any signs of active infection which is great news. No fevers, chills, nausea, vomiting, or diarrhea. Overall extremely pleased with where things stand today. I think the patient is making excellent progress. 06/16/2021 upon  evaluation today patient appears to be doing well in regard to her wound. This is going require little bit of debridement today and that was discussed with the patient. Otherwise she seems to be doing quite well and I am actually very pleased with where things stand at this point. No fevers, chills, nausea, vomiting, or diarrhea. 06/23/2021 upon evaluation today patient appears to be doing well with regard to her wounds. She has been tolerating the dressing changes without complication. Fortunately there does not appear to be any evidence of infection and she has not had air in her home which she actually lives at an assisted living that got fixed this morning. With that being said because of that her wrap has been extremely hot and bothersome for her over the past week. Objective Constitutional Well-nourished and well-hydrated in no acute distress. Vitals Time Taken: 10:55 AM, Height: 62 in, Weight: 150 lbs, BMI: 27.4, Temperature: 98.6 F, Pulse: 89 bpm, Respiratory Rate: 18 breaths/min, Blood Pressure: 121/72 mmHg. Respiratory normal breathing without difficulty. Psychiatric this patient is able to make decisions and demonstrates good insight into disease process. Alert and Oriented x 3. pleasant and cooperative. General Notes: Upon inspection patient's wound bed actually showed signs of excellent granulation epithelization at this point. This is feeling quite nicely and seems to be healing extremely well Happy with the collagen and I am good recommend that we actually continue as such with the current measures. Integumentary (Hair, Skin) Wound #7 status is Open. Original cause of wound was Gradually Appeared. The date acquired was: 05/12/2021. The wound has been in treatment 5 weeks. The wound is located on the Right,Lateral Malleolus. The wound measures 0.5cm length x 0.7cm width x 0.2cm depth; 0.275cm^2 area and 0.055cm^3 volume. There is Fat Layer (Subcutaneous Tissue) exposed. There is no  tunneling or undermining noted. There is a medium amount of serous drainage noted. The wound margin is distinct with the outline attached to the wound base. There is large (67-100%) red, pink, pale granulation within the wound bed. There is a small (1-33%) amount of necrotic tissue within the wound bed including Adherent Slough.  Assessment Shalene, Gallen [name] ([id number]) Active Problems ICD-10 Pressure ulcer of right ankle, stage 3 Lymphedema, not elsewhere classified Venous insufficiency (chronic) (peripheral) Presence of cardiac pacemaker Long term (current) use of anticoagulants Procedures Wound #7 Pre-procedure diagnosis of Wound #7 is a Pressure Ulcer located on the Right,Lateral Malleolus . There was a Three Layer Compression Therapy Procedure by Carlene Coria, RN. Post procedure Diagnosis Wound #7: Same as Pre-Procedure Plan Follow-up Appointments: Return Appointment in 1 week. Nurse Visit as needed Bathing/ Shower/ Hygiene: May shower with wound dressing protected with water repellent cover or cast protector. Edema Control - Lymphedema / Segmental Compressive Device / Other: Optional: One layer of unna paste to top of compression wrap (to act as an anchor). Elevate, Exercise Daily and Avoid Standing for Long Periods of Time. Elevate legs to the level of the heart and pump ankles as often as possible Elevate leg(s) parallel to the floor when sitting. WOUND #7: - Malleolus Wound Laterality: Right, Lateral Cleanser: Soap and Water 1 x Per Week/30 Days Discharge Instructions: Gently cleanse wound with antibacterial soap, rinse and pat dry prior to dressing wounds Primary Dressing: Prisma 4.34 (in) 1 x Per Week/30 Days Discharge Instructions: Moisten w/normal saline or sterile water; Cover wound as directed. Do not remove from wound bed. Secondary Dressing: ABD Pad 5x9 (in/in) 1 x Per Week/30 Days Discharge Instructions: Cover with ABD pad Compression Wrap: Profore Lite LF 3  Multilayer Compression Bandaging System 1 x Per Week/30 Days Discharge Instructions: Apply 3 multi-layer wrap as prescribed. 1. Would recommend currently we continue with the silver collagen I think that still the best way to go. 2. I am also can recommend that we have the patient continue with the compression wrap. I do believe the 3 layer compression wrap has been beneficial. 3. I am also going to suggest that the patient continue to elevate her leg is much as possible she does have edema bilaterally and really does need to have compression socks on pretty much all the time. We will see patient back for reevaluation in 1 week here in the clinic. If anything worsens or changes patient will contact our office for additional recommendations. Electronic Signature(s) Signed: 06/23/2021 11:42:42 AM By: Worthy Keeler PA-C Entered By: Worthy Keeler on 06/23/2021 11:42:42 Beale, [name] ([id number]) -------------------------------------------------------------------------------- SuperBill Details Patient Name: [name] Date of Service: 06/23/2021 Medical Record Number: [id number] Patient Account Number: [id number] Date of Birth/Sex: Oct 25, 1923 (87 y.o. F) Treating RN: Carlene Coria Primary Care Provider: Adrian Prows Other Clinician: Referring Provider: Adrian Prows Treating Provider/Extender: Skipper Cliche in Treatment: 5 Diagnosis Coding ICD-10 Codes Code Description [phone] Pressure ulcer of right ankle, stage 3 I89.0 Lymphedema, not elsewhere classified I87.2 Venous insufficiency (chronic) (peripheral) Z95.0 Presence of cardiac pacemaker Z79.01 Long term (current) use of anticoagulants Facility Procedures CPT4 Code: [id number] Description: (Facility Use Only) [phone] - [place] [facility] RT LEG Modifier: Quantity: 1 Physician Procedures CPT4 Code: 4497530 Description: 05110 - WC PHYS LEVEL 3 - EST PT Modifier: Quantity: 1 CPT4 Code: Description:  ICD-10 Diagnosis Description 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, stage 3 I89.0 Lymphedema, not elsewhere classified I87.2 Venous insufficiency (chronic) (peripheral) Z95.0 Presence of cardiac pacemaker Modifier: Quantity: Electronic Signature(s) Signed: 06/23/2021 11:42:56 AM By: Worthy Keeler PA-C Entered By: Worthy Keeler on 06/23/2021 11:42:55

## 2021-06-30 ENCOUNTER — Encounter: Payer: Medicare Other | Admitting: Physician Assistant

## 2021-06-30 ENCOUNTER — Other Ambulatory Visit: Payer: Self-pay

## 2021-06-30 DIAGNOSIS — L89513 Pressure ulcer of right ankle, stage 3: Secondary | ICD-10-CM | POA: Diagnosis not present

## 2021-06-30 NOTE — Progress Notes (Addendum)
ZEVA, OSTROVSKY ([id number]) Visit Report for 06/30/2021 Chief Complaint Document Details Patient Name: [name], [name]. Date of Service: 06/30/2021 11:00 AM Medical Record Number: [id number] Patient Account Number: [id number] Date of Birth/Sex: [birth year] (87 y.o. F) Treating RN: Dolan Amen Primary Care Provider: Adrian Prows Other Clinician: Referring Provider: Adrian Prows Treating Provider/Extender: Skipper Cliche in Treatment: 6 Information Obtained from: Patient Chief Complaint Right foot ulcer Electronic Signature(s) Signed: 06/30/2021 10:54:51 AM By: Worthy Keeler PA-C Entered By: Worthy Keeler on 06/30/2021 10:54:51 [name], [name] ([id number]) -------------------------------------------------------------------------------- HPI Details Patient Name: [name] Date of Service: 06/30/2021 11:00 AM Medical Record Number: [id number] Patient Account Number: [id number] Date of Birth/Sex: [birth year] (87 y.o. 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 Dr. Amalia Hailey the podiatrist saw her on 07/20/2017 and sent her to the wound center for possible hyperbaric oxygen therapy. past medical history of peripheral vascular disease, varicose veins, status post appendectomy, basal cell carcinoma excision from the left leg, cholecystectomy, pacemaker placement, right lower extremity angiography done by Dr. dew in March 2017 with placement of a stent. there is also note of a successful ablation of the right small saphenous vein done which was reviewed by ultrasound on 10/24/2016. the  patient had a right small saphenous vein ablation done on 10/20/2016. The patient has never been a smoker. She has been seen by Dr. Corene Cornea dew the vascular surgeon who most recently saw her on 06/15/2017 for evaluation of ongoing problems with right leg swelling. She had a lower extremity arterial duplex examination done(02/13/17) which showed patent distal right superficial femoral artery stent and above-the-knee popliteal stent without evidence of restenosis. The ABI was more than 1.3 on the right and more than 1.3 on the left. This was consistent with noncompressible arteries due to medial calcification. The right great toe pressure and PPG waveforms are within normal limits and the left great toe pressure and PPG waveforms are decreased. he recommended she continue to wear her compression stockings and continue with elevation. She is scheduled to have a noninvasive arterial study in the near future 08/16/2017 -- had a lower extremity arterial duplex examination done which showed patent distal right superficial femoral artery stent and above-the-knee popliteal stent without evidence of restenosis. The ABI was more than 1.3 on the right and more than 1.3 on the left. This was consistent with noncompressible arteries due to medial calcification. The right great toe pressure and PPG waveforms are within normal limits and the left great toe pressure and PPG waveforms are decreased. the x-ray of the right ankle has not yet been done 08/24/2017 -- had a right ankle x-ray -- IMPRESSION:1. No fracture, bone lesion or evidence of osteomyelitis. 2. Lateral soft tissue swelling with a soft tissue ulcer. she has not yet seen the vascular surgeon for review 08/31/17 on evaluation today patient's wound appears to be showing signs of improvement. She still with her appointment with vascular in order to review her results of her vascular study and then determine if any intervention would be recommended at that  time. No fevers, chills, nausea, or vomiting noted at this time. She has been tolerating the dressing changes without complication. 09/28/17 on evaluation today patient's  wound appears to show signs of good improvement in regard to the granulation tissue which is surfacing. There is still a layer of slough covering the wound and the posterior portion is still significantly deeper than the anterior nonetheless there has been some good sign of things moving towards the better. She is going to go back to Dr. dew for reevaluation to ensure her blood flow is still appropriate. That will be before her next evaluation with [place] next week. No fevers, chills, nausea, or vomiting noted at this time. Patient does have some discomfort rated to be a 3-4/10 depending on activity specifically cleansing the wound makes it worse. 10/05/2017 -- the patient was seen by Dr. Lucky Cowboy last week and noninvasive studies showed a normal right ABI with brisk triphasic waveforms consistent with no arterial insufficiency including normal digital pressures. The duplex showed a patent distal right SFA stent and the proximal SFA was also normal. He was pleased with her test and thought she should have enough of perfusion for normal wound healing. He would see her back in 6 months time. 12/21/17 on evaluation today patient appears to be doing fairly well in regard to her right lateral ankle wound. Unfortunately the main issue that she is expansion at this point is that she is having some issues with what appears to be some cellulitis in the right anterior shin. She has also been noting a little bit of uncomfortable feeling especially last night and her ankle area. I'm afraid that she made the developing a little bit of an infection. With that being said I think it is in the early stages. 12/28/17 on evaluation today patient's ankle appears to be doing excellent. She's making good progress at this point the cellulitis seems to have improved  after last week's evaluation. Overall she is having no significant discomfort which is excellent news. She does have an appointment with Dr. dew on March 29, 2018 for reevaluation in regard to the stent he placed. She seems to have excellent blood flow in the right lower extremity. 01/19/12 on evaluation today patient's wound appears to be doing very well. In fact she does not appear to require debridement at this point, there's no evidence of infection, and overall from the standpoint of the wound she seems to be doing very well. With that being said I believe that it may be time to switch to different dressing away from the [facility] Dressing she tells me she does have a lot going on her friend actually passed away yesterday and she's also having a lot of issues with her husband this obviously is weighing heavy on her as far as your thoughts and concerns today. 01/25/18 on evaluation today patient appears to be doing fairly well in regard to her right lateral malleolus. She has been tolerating the dressing changes without complication. Overall I feel like this is definitely showing signs of improvement as far as how the overall appearance of the wound is there's also evidence of epithelium start to migrate over the granulation tissue. In general I think that she is progressing nicely as far as the wound is concerned. The only concern she really has is whether or not we can switch to every other week visits in order to avoid having as many appointments as her daughters have a difficult time getting her to her appointments as well as the patient's husband to his he is not doing very well at this point. 02/22/18 on evaluation today patient's right lateral malleolus ulcer  appears to be doing great. She has been tolerating the dressing changes without complication. Overall you making excellent progress at this time. Patient is having no significant discomfort. [name], [name] ([id number]) 03/15/18  on evaluation today patient appears to be doing much more poorly in regard to her right lateral ankle ulcer at this point. Unfortunately since have last seen her her husband has passed just a few days ago is obviously weighed heavily on her her daughter also had surgery well she is with her today as usual. There does not appear to be any evidence of infection she does seem to have significant contusion/deep tissue injury to the right lateral malleolus which was not noted previous when I saw her last. It's hard to tell of exactly when this injury occurred although during the time she was spending the night in the hospital this may have been most likely. 03/22/18 on evaluation today patient appears to actually be doing very well in regard to her ulcer. She did unfortunately have a setback which was noted last week however the good news is we seem to be getting back on track and in fact the wound in the core did still have some necrotic tissue which will be addressed at this point today but in general I'm seeing signs that things are on the up and up. She is glad to hear this obviously she's been somewhat concerned that due to the how her wound digressed more recently. 03/29/18 on evaluation today patient appears to be doing fairly well in regard to her right lower extremity lateral malleolus ulcer. She unfortunately does have a new area of pressure injury over the inferior portion where the wound has opened up a little bit larger secondary to the pressure she seems to be getting. She does tell me sometimes when she sleeps at night that it actually hurts and does seem to be pushing on the area little bit more unfortunately. There does not appear to be any evidence of infection which is good news. She has been tolerating the dressing changes without complication. She also did have some bruising in the left second and third toes due to the fact that she may have bump this or injured it although she has  neuropathy [name] she does not feel she did move recently that may have been where this came from. Nonetheless there does not appear to be any evidence of infection at this time. 04/12/18 on evaluation today patient's wound on the right lateral ankle actually appears to be doing a little bit better with a lot of necrotic docking tissue centrally loosening up in clearing away. However she does have the beginnings of a deep tissue injury on the left lateral malleolus likely due to the fact we've been trying offload the right as much as we have. I think she may benefit from an assistive soft device to help with offloading and it looks like they're looking at one of the doughnut conditions that wraps around the lower leg to offload which I think will definitely do a good job. With that being said I think we definitely need to address this issue on the left before it becomes a wound. Patient is not having significant pain. 04/19/18 on evaluation today patient appears to be doing excellent in regard to the progress she's made with her right lateral ankle ulcer. The left ankle region which did show evidence of a deep tissue injury seems to be resolving there's little fluid noted underneath and a blister there's  nothing open at this point in time overall I feel like this is progressing nicely which is good news. She does not seem to be having significant discomfort at this point which is also good news. 04/25/18-She is here in follow up evaluation for bilateral lateral malleolar ulcers. The right lateral malleolus ulcer with pale subcutaneous tissue exposure, central area of ulcer with tendon/periosteum exposed. The left lateral malleolus ulcer now with central area of nonviable tissue, otherwise deep tissue injury. She is wearing compression wraps to the left lower extremity, she will place the right lower extremity compression wraps on when she gets home. She will be out of town over the weekend and return next  week and follow-up appointment. She completed her doxycycline this morning 05/03/18 on evaluation today patient appears to be doing very well in regard to her right lateral ankle ulcer in general. At least she's showing some signs of improvement in this regard. Unfortunately she has some additional injury to the left lateral malleolus region which appears to be new likely even over the past several days. Again this determination is based on the overall appearance. With that being said the patient is obviously frustrated about this currently. 05/10/18-She is here in follow-up evaluation for bilateral lateral malleolar ulcers. She states she has purchased offloading shoes/boots and they will arrive tomorrow. She was asked to bring them in the office at next week's appointment [name] her provider is aware of product being utilized. She continues to sleep on right or left side, she has been encouraged to sleep on her back. The right lateral malleolus ulcer is precariously close to peri-osteum; will order xray. The left lateral malleolus ulcer is improved. Will switch back to santyl; she will follow up next week. 05/17/18 on evaluation today patient actually appears to be doing very well in regard to her malleolus her ulcers compared to last time I saw them. She does not seem to have as much in the way of contusion at this point which is great news. With that being said she does continue to have discomfort and I do believe that she is still continuing to benefit from the offloading/pressure reducing boots that were recommended. I think this is the key to trying to get this to heal up completely. 05/24/18 on evaluation today patient actually appears to be doing worse at this point in time unfortunately compared to her last week's evaluation. She is having really no increased pain which is good news unfortunately she does have more maceration in your theme and noted surrounding the right lateral ankle the left  lateral ankle is not really is erythematous I do not see signs of the overt cellulitis on that side. Unfortunately the wounds do not seem to have shown any signs of improvement since the last evaluation. She also has significant swelling especially on the right compared to previous some of this may be due to infection however also think that she may be served better while she has these wounds by compression wrapping versus continuing to use the Juxta-Lite for the time being. Especially with the amount of drainage that she is experiencing at this point. No fevers, chills, nausea, or vomiting noted at this time. 05/31/18 on evaluation today patient appears to actually be doing better in regard to her right lateral lower extremity ulcer specifically on the malleolus region. She has been tolerating the antibiotic without complication. With that being said she still continues to have issues but a little bit of redness although nothing like  she what she was experiencing previous. She still continues to pressure to her ankle area she did get the problem on offloading boots unfortunately she will not wear them she states there too uncomfortable and she can't get in and out of the bed. Nonetheless at this point her wounds seem to be continually getting worse which is not what we want I'm getting somewhat concerned about her progress and how things are going to proceed if we do not intervene in some way shape or form. I therefore had a very lengthy conversation today about offloading yet again and even made a specific suggestion for switching her to a memory foam mattress and even gave the information for a specific one that they could look at getting if it was something that they were interested in considering. She does not want to be considered for a hospital bed air mattress although honestly insurance would not cover it that she does not have any wounds on her trunk. 06/14/18 on evaluation today both wounds  over the bilateral lateral malleolus her ulcers appear to be doing better there's no evidence of pressure injury at this point. She did get the foam mattress for her bed and this does seem to have been extremely beneficial for her in my pinion. Her daughter states that she is having difficulty getting out of bed because of how soft it is. The patient also relates this to be. Nonetheless I do feel like she's actually doing better. Unfortunately right after and around the time she was getting the mattress she also sustained a fall when she got up to go pick up the phone and ended up injuring her right elbow she has [address] sut[address] place. We are not caring for this currently although home health is going to be taking the sutures out shortly. Nonetheless this may be something that we need to evaluate going forward. It depends on how well it has or has not healed in the end. She also recently saw an orthopedic specialist for an injection in the right shoulder just before her fall unfortunately the fall seems to have worsened her pain. 06/21/18 on evaluation today patient appears to be doing about the same in regard to her lateral malleolus ulcers. Both appear to be just a little bit deeper but again we are clinging away the necrotic and dead tissue which I think is why this is progressing towards a deeper realm as opposed [name], [name]. ([id number]) to improving from my measurement standpoint in that regard. Nonetheless she has been tolerating the dressing changes she absolutely hates the memory foam mattress topper that was obtained for her nonetheless I do believe this is still doing excellent as far as taking care of excess pressure in regard to the lateral malleolus regions. She in fact has no pressure injury that I see whereas in weeks past it was week by week I was constantly seeing new pressure injuries. Overall I think it has been very beneficial for her. 07/03/18; patient arrives in my clinic  today. She has deep punched out areas over her bilateral lateral malleoli. The area on the right has some more depth. We spent a lot of time today talking about pressure relief for these areas. This started when her daughter asked for a prescription for a memory foam mattress. I have never written a prescription for a mattress and I don't think insurances would pay for that on an ordinary bed. In any case he came up that she has foam  boots that she refuses to wear. I would suggest going to these before any other offloading issues when she is in bed. They say she is meticulous about offloading this the rest of the day 07/10/18- She is seen in follow-up evaluation for bilateral, lateral malleolus ulcers. There is no improvement in the ulcers. She has purchased and is sleeping on a memory foam mattress/overlay, she has been using the offloading boots nightly over the past week. She has a follow up appointment with vascular medicine at the end of October, in my opinion this follow up should be expedited given her deterioration and suboptimal TBI results. We will order plain film xray of the left ankle as deeper structures are palpable; would consider having MRI, regardless of xray report(s). The ulcers will be treated with iodoflex/iodosorb, she is unable to safely change the dressings daily with santyl. 07/19/18 on evaluation today patient appears to be doing in general visually well in regard to her bilateral lateral malleolus ulcers. She has been tolerating the dressing changes without complication which is good news. With that being said we did have an x-ray performed on 07/12/18 which revealed a slight loosen see in the lateral portion of the distal left fibula which may represent artifact but underline lytic destruction or osteomyelitis could not be excluded. MRI was recommended. With that being said we can see about getting the patient scheduled for an MRI to further evaluate this area. In fact we have  that scheduled currently for August 20 19,019. 07/26/18 on evaluation today patient's wound on the right lateral ankle actually appears to be doing fairly well at this point in my pinion. She has made some good progress currently. With that being said unfortunately in regard to the left lateral ankle ulcer this seems to be a little bit more problematic at this time. In fact as I further evaluated the situation she actually had bone exposed which is the first time that's been the case in the bone appear to be necrotic. Currently I did review patient's note from Dr. Bunnie Domino office with [facility]. He stated that ABI was 1.26 on the right and 0.95 on the left with good waveforms. Her perfusion is stable not reduced from previous studies and her digital waveforms were pretty good particularly on the right. His conclusion upon review of the note was that there was not much she could do to improve her perfusion and he felt she was adequate for wound healing. His suggestion was that she continued to see [place] and consider a synthetic skin graft if there was no underlying infection. He plans to see her back in six months or as needed. 08/01/18 on evaluation today patient appears to be doing better in regard to her right lateral ankle ulcer. Her left lateral ankle ulcer is about the same she still has bone involvement in evidence of necrosis. There does not appear to be evidence of infection at this time On the right lateral lower extremity. I have started her on the Augmentin she picked this up and started this yesterday. This is to get her through until she sees infectious disease which is scheduled for 08/12/18. 08/06/18 on evaluation today patient appears to be doing rather well considering my discussion with patient's daughter at the end of last week. The area which was marked where she had erythema seems to be improved and this is good news. With that being said overall the patient seems  to be making good improvement when it  comes to the overall appearance of the right lateral ankle ulcer although this has been slow she at least is coming around in this regard. Unfortunately in regard to the left lateral ankle ulcer this is osteomyelitis based on the pathology report as well is bone culture. Nonetheless we are still waiting CT scan. Unfortunately the MRI we originally ordered cannot be performed as the patient is a pacemaker which I had overlooked. Nonetheless we are working on the CT scan approval and scheduling as of now. She did go to the hospital over the weekend and was placed on IV Cefzo for a couple of days. Fortunately this seems to have improved the erythema quite significantly which is good news. There does not appear to be any evidence of worsening infection at this time. She did have some bleeding after the last debridement therefore I did not perform any sharp debridement in regard to left lateral ankle at this point. Patient has been approved for a snap vac for the right lateral ankle. 08/14/18; the patient with wounds over her bilateral lateral malleoli. The area on the right actually looks quite good. Been using a snap back on this area. Healthy granulation and appears to be filling in. Unfortunately the area on the left is really problematic. 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 Also noted to have cellulitis. She saw Dr. Novella Olive of infectious disease today and was put on linezolid. We are able to verify this with her pharmacy. She is completed the Augmentin that she was already on. We've been using Iodoflex to this area 08/23/18 on evaluation today patient's wounds both actually appear to be doing better compared to my prior evaluations. Fortunately she showing signs of good improvement in regard to the overall wound status especially where were using the snap vac on the right. In  regard to left lateral malleolus the wound bed actually appears to be much cleaner than previously noted. I do not feel any phone directly probed during evaluation today and though there is tendon noted this does not appear to be necrotic it's actually fairly good as far as the overall appearance of the tendon is concerned. In general the wound bed actually appears to be doing significantly better than it was previous. Patient is currently in the care of [name] and I did review that note today. He actually has her on two weeks of linezolid and then following the patient will be on 1-2 months of Keflex. That is the plan currently. She has been on antibiotics therapy as prescribed by myself initially starting on July 30, 2018 and has been on that continuously up to this point. 08/30/18 on evaluation today patient actually appears to be doing much better in regard to her right lateral malleolus ulcer. She has been tolerating the dressing changes specifically the snap vac without complication although she did have some issues with the seal currently. Apparently there was some trouble with getting it to maintain over the past week past Sunday. Nonetheless overall the wound appears better in regard to the right lateral malleolus region. In regard to left lateral malleolus this actually show some signs of additional granulation although there still tendon noted in the base of the wound this appears to be healthy not necrotic in any way whatsoever. We are considering potentially using a snap vac for the left lateral malleolus as well the product wrap from [employer], [place], was present in  the clinic today we're going to see this patient I did have her come in with me after obtaining consent from the patient and her daughter in order to look at the wound and see if there's any recommendation one way or another as to whether or not they felt the snapback could be beneficial for the left lateral malleolus region.  But the conclusion was that it might be but that this is definitely a little bit deeper wound than what traditionally would be utilized for a snap vac. 09/06/18 on evaluation today patient actually appears to be doing excellent in my pinion in regard to both ankle ulcers. She has been tolerating the dressing changes without complication which is great news. Specifically we have been using the snap vac. In regard to the right ankle I'm not even sure that this is going to be necessary for today and following as the wound has filled in quite nicely. In regard to the left ankle I do believe [name], [name] ([id number]) that we're seeing excellent epithelialization from the edge as well as granulation in the central portion the tendon is still exposed but there's no evidence of necrotic bone and in general I feel like the patient has made excellent progress even compared to last week with just one week of the snap vac. 09/11/18; this is a patient who has wounds on her bilateral lateral malleoli. Initially both of these were deep stage IV wounds in the setting of chronic arterial insufficiency. She has been revascularized. As I understand think she been using snap vacs to both of these wounds however the area on the right became more superficial and currently she is only using it on the left. Using silver collagen on the right and silver collagen under the back on the left I believe 09/19/18 on evaluation today patient actually appears to be doing very well in regard to her lateral malleolus or ulcers bilaterally. She has been tolerating the dressing changes without complication. Fortunately there does not appear to be any evidence of infection at this time. Overall I feel like she is improving in an excellent manner and I'm very pleased with the fact that everything seems to be turning towards the better for her. This has obviously been a long road. 09/27/18 on evaluation today patient actually appears  to be doing very well in regard to her bilateral lateral malleolus ulcers. She has been tolerating the dressing changes without complication. Fortunately there does not appear to be any evidence of infection at this time which is also great news. No fevers, chills, nausea, or vomiting noted at this time. Overall I feel like she is doing excellent with the snap vac on the left malleolus. She had 40 mL of fluid collection over the past week. 10/04/18 on evaluation today patient actually appears to be doing well in regard to her bilateral lateral malleolus ulcers. She continues to tolerate the dressing changes without complication. One issue that I see is the snap vac on the left lateral malleolus which appears to have sealed off some fluid underlying this area and has not really allowed it to heal to the degree that I would like to see. For that reason I did suggest at this point we may want to pack a small piece of packing strip into this region to allow it to more effectively wick out fluid. 10/11/18 in general the patient today does not feel that she has been doing very well. She's been a little bit lethargic and  subsequently is having bodyaches as well according to what she tells me today. With that being said overall she has been concerned with the fact that something may be worsening although to be honest her wounds really have not been appearing poorly. She does have a new ulcer on her left heel unfortunately. This may be pressure related. Nonetheless it seems to me to have potentially started at least as a blister I do not see any evidence of deep tissue injury. In regard to the left ankle the snap vac still seems to be causing the ceiling off of the deeper part of the wound which is in turn trapping fluid. I'm not extremely pleased with the overall appearance as far as progress from last week to this week therefore I'm gonna discontinue the snap vac at this point. 10/18/18 patient unfortunately  this point has not been feeling well for the past several days. She was seen by Grayland Ormond her primary care provider who is a Librarian, academic at [facility]. Subsequently she states that she's been very weak and generally feeling malaise. No fevers, chills, nausea, or vomiting noted at this time. With that being said bloodwork was performed at the PCP office on the 11th of this month which showed a white blood cell count of 10.7. This was repeated today and shows a white blood cell count of 12.4. This does show signs of worsening. Coupled with the fact that she is feeling worse and that her left ankle wound is not really showing signs of improvement I feel like this is an indication that the osteomyelitis is likely exacerbating not improving. Overall I think we may also want to check her C-reactive protein and sedimentation rate. Actually did call [facility] office this afternoon while the patient was in the office here with me. Subsequently based on the findings we discussed treatment possibilities and I think that it is appropriate for [place] to go ahead and initiate treatment with doxycycline which I'm going to do. Subsequently he did agree to see about adding a CRP and sedimentation rate to her orders. If that has not already been drawn to where they can run it they will contact the patient she can come back to have that check. They are in agreement with plan as far as the patient and her daughter are concerned. Nonetheless also think we need to get in touch with Dr. Henreitta Leber office to see about getting the patient scheduled with him as soon as possible. 11/08/18 on evaluation today patient presents for follow-up concerning her bilateral foot and ankle ulcers. I did do an extensive review of her chart in epic today. Subsequently she was seen by [name] he did initiate Cefepime IV antibiotic therapy. Subsequently she had some issues with her PICC line this had to be removed because it  was coiled and then replaced. Fortunately that was now settled. Unfortunately she has continued have issues with her left heel as well as the issues that she is experiencing with her bilateral lateral malleolus regions. I do believe however both areas seem to be doing a little bit better on evaluation today which is good news. No fevers, chills, nausea, or vomiting noted at this time. She actually has an angiogram schedule with Dr. dew on this coming Monday, November 11, 2018. Subsequently the patient states that she is feeling much better especially than what she was roughly 2 weeks ago. She actually had to cancel an appointment because she was feeling [name] poorly. No fevers,  chills, nausea, or vomiting noted at this time. 11/15/18 on evaluation today patient actually is status post having had her angiogram with Dr. dew Monday, four days ago. 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 He had to go and work on several areas of the vasculature fortunately he was able to obtain no more than a 30% residual stenosis throughout post procedure. I reviewed this note today. I think this will definitely help with healing at this time. Fortunately there does not appear to be any signs of infection and I do feel like ratio already has a better appearance to it. 11/22/18 upon evaluation today patient actually appears to be doing very well in regard to her wounds in general. The right lateral malleolus looks excellent the heel looks better in the left lateral malleolus also appears to be doing a little better. With that being said the right second toe actually appears to be open and training we been watching this is been dry and stable but now is open. 12/03/2018 Seen today for follow-up and management of multiple bilateral lower extremity wounds. New pressure injury of the great toe which is closed at this time. Wound of the right distal second toe appears larger today with deep undermining  and a pocket of fluid present within the undermining region. Left and right malleolus is wounds are stable today with no signs and symptoms of infection.Denies any needs or concerns during exam today. 12/13/18 on evaluation today patient appears to be doing somewhat better in regard to her left heel ulcer. She also seems to be completely healed in regard to the right lateral malleolus ulcer. The left malleolus ulcer is smaller what unfortunately the wounds which are new over the first and second toes of the right foot are what are most concerning at this point especially the second. Both areas did require sharp debridement today. 12/20/18 on evaluation today patient's wound actually appears to be doing better in regard to left lateral ankle and her right lateral ankle continues to remain healed. The hill ulcer on the left is improved. She does have improvement noted as well in regard to both toe ulcers. Overall I'm very pleased in this regard. No fevers, chills, nausea, or vomiting noted at this time. 12/23/18 on evaluation today patient is seen after she had her toenails trimmed at the podiatrist office due to issues with her right great toe. There was what appeared to be dark eschar on the surface of the wound which had her in the podiatrist concerned. Nonetheless as I remember that during the last office visit I had utilize silver nitrate of this area I was much less concerned about the situation. Subsequently I was able to clean off much of this tissue without any complication today. This does not appear to show any signs of infection and actually look somewhat better Somera, [name] ([id number]) compared to last time post debridement. Her second toe on the right foot actually had callous over and there did appear still be some fluid underneath this that would require debridement today. 12/27/18 on evaluation today patient actually appears to be showing signs of improvement at all locations. Even  the left lateral ankle although this is not quite as great as the other sites. Fortunately there does not appear to be any signs of infection at this time and both of her toes on the right foot seem to be showing signs of improvement which is good news and very pleased  in this regard. 01/03/19 on evaluation today patient appears to be doing better for the most part in regard to her wounds in particular. There does not appear to be any evidence of infection at this time which is good news. Fortunately there is no sign of really worsening anywhere except for the right great toe which she does have what appears to be a bruise/deep tissue injury which is very superficial and already resolving. I'm not sure where this came from I questioned her extensively and she does not recall what may have happened with this. Other than that the patient seems to be doing well even the left lateral ankle ulcer looks good and is getting smaller. 01/10/19 on evaluation today patient appears to be doing well in regard to her left heel wound and both of her toe wounds. Overall I feel like there is definitely improvement here and I'm happy in that regard. With that being said unfortunately she is having issues with the left lateral malleolus ulcer which unfortunately still has a lot of depth to it. This is gonna be a very difficult wound for [place] to be able to truly get to heal. I may want to consider some type of skin substitute to see if this would be of benefit for her. I'll discuss this with her more the next visit most likely. This was something I thought about more at the end of the visit when I was Artie out of the room and the patient had been discharged. 01/17/19 on evaluation today patient appears to be doing very well in regard to her wounds in general. She's been making excellent progress at this time. Fortunately there's no sign of infection at this time either. No fevers, chills, nausea, or vomiting noted at this  time. The biggest issue is still her left lateral malleolus where it appears to be doing well and is getting smaller but still shows a small corner where this is deeper and goes down into what appears to be the joint space. Nonetheless this is taking much longer to heal although it still looks better in smaller than previous evaluations. 01/24/19 on evaluation today patient's wounds actually appear to be doing rather well in general overall. She did require some sharp debridement in regard to the right great toe but everything else appears to be doing excellent no debridement was even necessary. No fevers, chills, nausea, or vomiting noted at this time. 01/31/19 on evaluation today patient actually appears to be doing much better in regard to her left foot wound on the heel as well as the ankle. The right great toe appears to be a little bit worse today this had callous over and trapped a lot of fluid underneath. Fortunately there's no signs of infection at any site which is great news. 02/07/19 on evaluation today patient actually appears to be doing decently well in regard to all of her ulcers at this point. No sharp debridement was required she is a little bit of hyper granulation in regard to the left lateral ankle as well as the left heel but the hill itself is almost completely healed which is excellent news. Overall been very pleased in this regard. 02/14/19 on evaluation today patient actually appears to be doing very well in regard to her ulcers on the right first toe, left lateral malleolus, and left heel. In fact the heel is almost completely healed at this point. The patient does not show any signs of infection which is good news. Overall very  pleased with how things have progressed. 04/18/19 Telehealth Evaluation During the COVID-19 National Emergency: Verbal Consent: Obtained from patient Allergies: reviewed and the active list is current. Medication changes: patient has no current  medication changes. COVID-19 Screening: 1. Have you traveled internationally or on a cruise ship in the last 14 dayso No 2. Have you had contact with someone with or under investigation for COVID-19o No 3. Have you had a fever, cough, sore throat, or experiencing shortness of breatho No on evaluation today actually did have a visit with this patient through a telehealth encounter with her home health nurse. Subsequently it was noted that the patient actually appears to be doing okay in regard to her wounds both the right great toe as well as the left lateral malleolus have shown signs of improvement although this in your theme around the left lateral malleolus there eschar coverings for both locations. The question is whether or not they are actually close and whether or not home health needs to discharge the patient or not. Nonetheless my concern is this point obviously is that without actually seeing her and being able to evaluate this directly I cannot ensure that she is completely healed which is the question that I'm being asked. 04/22/19 on evaluation today patient presents for her first evaluation since last time I saw her which was actually February 14, 2019. I did do a telehealth visit last week in which point it was questionable whether or not she may be healed and had to bring her in today for confirmation. With that being said she does seem to be doing quite well at this point which is good news. There does not appear to be any drainage in the deed I believe her wounds may be healed. Readmission: 09/04/2019 on evaluation today patient appears to be doing unfortunately somewhat more poorly in regard to her left foot ulcer secondary to a wound that began on 08/21/2019 at least when she first noticed this. Fortunately she has not had any evidence of active infection at this time. Systemically. I also do not necessarily see any evidence of infection at the blister/wound site on the first  metatarsal head plantar aspect. This almost appears to be something that may have just rubbed inappropriately causing this to breakdown. They did not want a wait too long to come in to be seen as again she had significant issues in the past with wounds that took quite a while to heal in fact it was close to 2 years. Nonetheless this does not appear to be quite that bad but again we do need to remove some of the necrotic tissue from the surface of the wound to tell exactly the extent. She does not appear to have any significant arterial disease at this point and again her last ABIs and TBI's are recorded above in the alert section her left ABI was 1.27 with a TBI of 0.72 to the right ABI 1.08 with a TBI of 0.39. Other than this the patient has been doing quite well since I last saw her and that was in May 2020. 09/11/2019 on evaluation today patient appeared to be doing very well with regard to her plantar foot ulcer on the left. In fact this appears to be almost completely healed which is awesome. That is after just 1 week of intervention. With that being said there is no signs of active infection at this time. 09/18/2019 on evaluation today patient actually appears to be doing excellent in fact she  is completely healed based on what I am seeing at this [name], [name] ([id number]) point. Fortunately there is no signs of active infection at this time and overall patient is very pleased to hear that this area has healed [name] quickly. Readmission: 05/13/2021 upon evaluation today this patient presents for reevaluation here in the clinic. This is a wound that actually we previously took care of. She had 1 on the right ankle and the left the left turned out to be be harder due to to heal but nonetheless is doing great at this point as the right that has reopened and it was noted first just several weeks ago with a scab over it and came off in just the past few days. Fortunately there does not appear to  be any obvious evidence of significant active infection at this time which is great news. No fevers, chills, nausea, vomiting, or diarrhea. 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 There does not appear to be any signs of this interfering in any way with her wound. She does have swelling we previously had compression socks for her ordered but again it does not look like she wears these on a regular basis by any means. 05/26/2021 upon evaluation today patient appears to be doing well with regard to her wound which is actually showing signs of excellent improvement. There does not appear to be any signs of active infection which is great news and overall very pleased with where things stand today. No fevers, chills, nausea, vomiting, or diarrhea. 06/02/2021 upon evaluation today patient's wound actually showing signs of excellent improvement. Fortunately there does not appear to be any signs of active infection which is great news. I think the patient is making good progress with regard to her wounds in general. 06/09/2021 upon evaluation today patient appears to be doing excellent in regard to her wounds currently. Fortunately there does not appear to be any signs of active infection which is great news. No fevers, chills, nausea, vomiting, or diarrhea. Overall extremely pleased with where things stand today. I think the patient is making excellent progress. 06/16/2021 upon evaluation today patient appears to be doing well in regard to her wound. This is going require little bit of debridement today and that was discussed with the patient. Otherwise she seems to be doing quite well and I am actually very pleased with where things stand at this point. No fevers, chills, nausea, vomiting, or diarrhea. 06/23/2021 upon evaluation today patient appears to be doing well with regard to her wounds. She has been tolerating the dressing changes without complication.  Fortunately there does not appear to be any evidence of infection and she has not had air in her home which she actually lives at an assisted living that got fixed this morning. With that being said because of that her wrap has been extremely hot and bothersome for her over the past week. 06/30/2021 upon evaluation today patient is actually making excellent progress in regard to her ankle ulcer. She has been tolerating the dressing changes without complication and overall extremely pleased with where things stand there does not appear to be any evidence of active infection which is great news. No fevers, chills, nausea, vomiting, or diarrhea. Electronic Signature(s) Signed: 06/30/2021 3:41:06 PM By: Worthy Keeler PA-C Entered By: Worthy Keeler on 06/30/2021 15:41:05 Borgeson, [name] ([id number]) -------------------------------------------------------------------------------- Physical Exam Details Patient Name: [name] Date of Service: 06/30/2021 11:00 AM  Medical Record Number: [id number] Patient Account Number: [id number] Date of Birth/Sex: [birth year] (87 y.o. F) Treating RN: Dolan Amen Primary Care Provider: Adrian Prows Other Clinician: Referring Provider: Adrian Prows Treating Provider/Extender: Skipper Cliche in Treatment: 6 Constitutional Well-nourished and well-hydrated in no acute distress. Respiratory normal breathing without difficulty. Psychiatric this patient is able to make decisions and demonstrates good insight into disease process. Alert and Oriented x 3. pleasant and cooperative. Notes Upon inspection patient's wound seems to be doing excellent and I feel like the surface of the wound is great her swelling is also well under control with compression on the right. She really needs to be wearing a compression sock on the left and I discussed that with her today as well. Her daughter was present during this discussion. Nonetheless she does have  Velcro compression wraps at home but she has not been wearing them to get a look for them before the next time she comes back into the clinic. Electronic Signature(s) Signed: 06/30/2021 3:41:42 PM By: Worthy Keeler PA-C Entered By: Worthy Keeler on 06/30/2021 15:41:41 [name], [name] ([id number]) -------------------------------------------------------------------------------- Physician Orders Details Patient Name: [name] Date of Service: 06/30/2021 11:00 AM Medical Record Number: [id number] Patient Account Number: [id number] Date of Birth/Sex: 03/14/1923 (87 y.o. F) Treating RN: Carlene Coria Primary Care Provider: Adrian Prows Other Clinician: Referring Provider: Adrian Prows Treating Provider/Extender: Skipper Cliche in Treatment: 6 Verbal / Phone Orders: No Diagnosis Coding ICD-10 Coding Code Description 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, stage 3 I89.0 Lymphedema, not elsewhere classified I87.2 Venous insufficiency (chronic) (peripheral) Z95.0 Presence of cardiac pacemaker Z79.01 Long term (current) use of anticoagulants Follow-up Appointments o Return Appointment in 1 week. o Nurse Visit as needed Bathing/ Shower/ Hygiene o May shower with wound dressing protected with water repellent cover or cast protector. Edema Control - Lymphedema / Segmental Compressive Device / Other o Optional: One layer of unna paste to top of compression wrap (to act as an anchor). o Elevate, Exercise Daily and Avoid Standing for Long Periods of Time. o Elevate legs to the level of the heart and pump ankles as often as possible o Elevate leg(s) parallel to the floor when sitting. Wound Treatment Wound #7 - Malleolus Wound Laterality: Right, Lateral Cleanser: Soap and Water 1 x Per Week/30 Days Discharge Instructions: Gently cleanse wound with antibacterial soap, rinse and pat dry prior to dressing wounds Primary Dressing: Prisma 4.34 (in) 1 x Per  Week/30 Days Discharge Instructions: Moisten w/normal saline or sterile water; Cover wound as directed. Do not remove from wound bed. Secondary Dressing: ABD Pad 5x9 (in/in) 1 x Per Week/30 Days Discharge Instructions: Cover with ABD pad Compression Wrap: Profore Lite LF 3 Multilayer Compression Bandaging System 1 x Per Week/30 Days Discharge Instructions: Apply 3 multi-layer wrap as prescribed. Electronic Signature(s) Signed: 06/30/2021 4:44:10 PM By: Worthy Keeler PA-C Signed: 07/01/2021 4:32:50 PM By: Carlene Coria RN Entered By: Carlene Coria on 06/30/2021 11:26:30 Mazariego, [name] ([id number]) -------------------------------------------------------------------------------- Problem List Details Patient Name: [name] Date of Service: 06/30/2021 11:00 AM Medical Record Number: [id number] Patient Account Number: [id number] Date of Birth/Sex: [birth year] (87 y.o. F) Treating RN: Dolan Amen Primary Care Provider: Adrian Prows Other Clinician: Referring Provider: Adrian Prows Treating Provider/Extender: Skipper Cliche in Treatment: 6 Active Problems ICD-10 Encounter Code Description Active Date MDM Diagnosis 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, stage 3 05/13/2021 No Yes I89.0 Lymphedema, not elsewhere classified 05/13/2021 No Yes I87.2 Venous insufficiency (chronic) (peripheral)  05/13/2021 No Yes Z95.0 Presence of cardiac pacemaker 05/13/2021 No Yes Z79.01 Long term (current) use of anticoagulants 05/13/2021 No Yes Inactive Problems Resolved Problems Electronic Signature(s) Signed: 06/30/2021 10:54:44 AM By: Worthy Keeler PA-C Entered By: Worthy Keeler on 06/30/2021 10:54:43 Vandewater, [name] ([id number]) -------------------------------------------------------------------------------- Progress Note Details Patient Name: [name] Date of Service: 06/30/2021 11:00 AM Medical Record Number: [id number] Patient Account Number: [id number] Date of  Birth/Sex: 1923-08-10 (87 y.o. 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 Dr. Amalia Hailey the podiatrist saw her on 07/20/2017 and sent her to the wound center for possible hyperbaric oxygen therapy. past medical history of peripheral vascular disease, varicose veins, status post appendectomy, basal cell carcinoma excision from the left leg, cholecystectomy, pacemaker placement, right lower extremity angiography done by Dr. dew in March 2017 with placement of a stent. there is also note of a successful ablation of the right small saphenous vein done which was reviewed by ultrasound on 10/24/2016. the patient had a right small saphenous vein ablation done on 10/20/2016. The patient has never been a smoker. She has been seen by Dr. Corene Cornea dew the vascular surgeon who most recently saw her on 06/15/2017 for evaluation of ongoing problems with right leg swelling. She had a lower extremity arterial duplex examination done(02/13/17) which showed patent distal right superficial femoral artery stent and above-the-knee popliteal stent without evidence of restenosis. The ABI was more than 1.3 on the right and more than 1.3 on the left. This was consistent with noncompressible arteries due to medial calcification. The right great toe pressure and PPG waveforms are within normal limits and the left great toe pressure and PPG waveforms are decreased. he recommended she continue to wear her compression stockings and continue with elevation. She is scheduled to have a  noninvasive arterial study in the near future 08/16/2017 -- had a lower extremity arterial duplex examination done which showed patent distal right superficial femoral artery stent and above-the-knee popliteal stent without evidence of restenosis. The ABI was more than 1.3 on the right and more than 1.3 on the left. This was consistent with noncompressible arteries due to medial calcification. The right great toe pressure and PPG waveforms are within normal limits and the left great toe pressure and PPG waveforms are decreased. the x-ray of the right ankle has not yet been done 08/24/2017 -- had a right ankle x-ray -- IMPRESSION:1. No fracture, bone lesion or evidence of osteomyelitis. 2. Lateral soft tissue swelling with a soft tissue ulcer. she has not yet seen the vascular surgeon for review 08/31/17 on evaluation today patient's wound appears to be showing signs of improvement. She still with her appointment with vascular in order to review her results of her vascular study and then determine if any intervention would be recommended at that time. No fevers, chills, nausea, or vomiting noted at this time. She has been tolerating the dressing changes without complication. 09/28/17 on evaluation today patient's wound appears to show signs of good improvement in regard to the granulation tissue which is surfacing. There is still a layer of slough covering the wound and the posterior portion is still significantly deeper  than the anterior nonetheless there has been some good sign of things moving towards the better. She is going to go back to Dr. dew for reevaluation to ensure her blood flow is still appropriate. That will be before her next evaluation with [place] next week. No fevers, chills, nausea, or vomiting noted at this time. Patient does have some discomfort rated to be a 3-4/10 depending on activity specifically cleansing the wound makes it worse. 10/05/2017 -- the patient was seen by Dr. Lucky Cowboy  last week and noninvasive studies showed a normal right ABI with brisk triphasic waveforms consistent with no arterial insufficiency including normal digital pressures. The duplex showed a patent distal right SFA stent and the proximal SFA was also normal. He was pleased with her test and thought she should have enough of perfusion for normal wound healing. He would see her back in 6 months time. 12/21/17 on evaluation today patient appears to be doing fairly well in regard to her right lateral ankle wound. Unfortunately the main issue that she is expansion at this point is that she is having some issues with what appears to be some cellulitis in the right anterior shin. She has also been noting a little bit of uncomfortable feeling especially last night and her ankle area. I'm afraid that she made the developing a little bit of an infection. With that being said I think it is in the early stages. 12/28/17 on evaluation today patient's ankle appears to be doing excellent. She's making good progress at this point the cellulitis seems to have improved after last week's evaluation. Overall she is having no significant discomfort which is excellent news. She does have an appointment with Dr. dew on March 29, 2018 for reevaluation in regard to the stent he placed. She seems to have excellent blood flow in the right lower extremity. 01/19/12 on evaluation today patient's wound appears to be doing very well. In fact she does not appear to require debridement at this point, there's no evidence of infection, and overall from the standpoint of the wound she seems to be doing very well. With that being said I believe that it may be time to switch to different dressing away from the [place] Dressing she tells me she does have a lot going on her friend actually passed away yesterday and she's also having a lot of issues with her husband this obviously is weighing heavy on her as far as your thoughts  and concerns today. 01/25/18 on evaluation today patient appears to be doing fairly well in regard to her right lateral malleolus. She has been tolerating the dressing changes without complication. Overall I feel like this is definitely showing signs of improvement as far as how the overall appearance of the wound is there's also evidence of epithelium start to migrate over the granulation tissue. In general I think that she is progressing nicely as far as the wound is concerned. The only concern she really has is whether or not we can switch to every other week visits in order to avoid having as many APIRL, ARZUAGA ([id number]) appointments as her daughters have a difficult time getting her to her appointments as well as the patient's husband to his he is not doing very well at this point. 02/22/18 on evaluation today patient's right lateral malleolus ulcer appears to be doing great. She has been tolerating the dressing changes without complication. Overall you making excellent progress at this time. Patient is having no significant discomfort. 03/15/18 on  evaluation today patient appears to be doing much more poorly in regard to her right lateral ankle ulcer at this point. Unfortunately since have last seen her her husband has passed just a few days ago is obviously weighed heavily on her her daughter also had surgery well she is with her today as usual. There does not appear to be any evidence of infection she does seem to have significant contusion/deep tissue injury to the right lateral malleolus which was not noted previous when I saw her last. It's hard to tell of exactly when this injury occurred although during the time she was spending the night in the hospital this may have been most likely. 03/22/18 on evaluation today patient appears to actually be doing very well in regard to her ulcer. She did unfortunately have a setback which was noted last week however the good news is we seem to  be getting back on track and in fact the wound in the core did still have some necrotic tissue which will be addressed at this point today but in general I'm seeing signs that things are on the up and up. She is glad to hear this obviously she's been somewhat concerned that due to the how her wound digressed more recently. 03/29/18 on evaluation today patient appears to be doing fairly well in regard to her right lower extremity lateral malleolus ulcer. She unfortunately does have a new area of pressure injury over the inferior portion where the wound has opened up a little bit larger secondary to the pressure she seems to be getting. She does tell me sometimes when she sleeps at night that it actually hurts and does seem to be pushing on the area little bit more unfortunately. There does not appear to be any evidence of infection which is good news. She has been tolerating the dressing changes without complication. She also did have some bruising in the left second and third toes due to the fact that she may have bump this or injured it although she has neuropathy [name] she does not feel she did move recently that may have been where this came from. Nonetheless there does not appear to be any evidence of infection at this time. 04/12/18 on evaluation today patient's wound on the right lateral ankle actually appears to be doing a little bit better with a lot of necrotic docking tissue centrally loosening up in clearing away. However she does have the beginnings of a deep tissue injury on the left lateral malleolus likely due to the fact we've been trying offload the right as much as we have. I think she may benefit from an assistive soft device to help with offloading and it looks like they're looking at one of the doughnut conditions that wraps around the lower leg to offload which I think will definitely do a good job. With that being said I think we definitely need to address this issue on the left  before it becomes a wound. Patient is not having significant pain. 04/19/18 on evaluation today patient appears to be doing excellent in regard to the progress she's made with her right lateral ankle ulcer. The left ankle region which did show evidence of a deep tissue injury seems to be resolving there's little fluid noted underneath and a blister there's nothing open at this point in time overall I feel like this is progressing nicely which is good news. She does not seem to be having significant discomfort at this point which is also  good news. 04/25/18-She is here in follow up evaluation for bilateral lateral malleolar ulcers. The right lateral malleolus ulcer with pale subcutaneous tissue exposure, central area of ulcer with tendon/periosteum exposed. The left lateral malleolus ulcer now with central area of nonviable tissue, otherwise deep tissue injury. She is wearing compression wraps to the left lower extremity, she will place the right lower extremity compression wraps on when she gets home. She will be out of town over the weekend and return next week and follow-up appointment. She completed her doxycycline this morning 05/03/18 on evaluation today patient appears to be doing very well in regard to her right lateral ankle ulcer in general. At least she's showing some signs of improvement in this regard. Unfortunately she has some additional injury to the left lateral malleolus region which appears to be new likely even over the past several days. Again this determination is based on the overall appearance. With that being said the patient is obviously frustrated about this currently. 05/10/18-She is here in follow-up evaluation for bilateral lateral malleolar ulcers. She states she has purchased offloading shoes/boots and they will arrive tomorrow. She was asked to bring them in the office at next week's appointment [name] her provider is aware of product being utilized. She continues to sleep on  right or left side, she has been encouraged to sleep on her back. The right lateral malleolus ulcer is precariously close to peri-osteum; will order xray. The left lateral malleolus ulcer is improved. Will switch back to santyl; she will follow up next week. 05/17/18 on evaluation today patient actually appears to be doing very well in regard to her malleolus her ulcers compared to last time I saw them. She does not seem to have as much in the way of contusion at this point which is great news. With that being said she does continue to have discomfort and I do believe that she is still continuing to benefit from the offloading/pressure reducing boots that were recommended. I think this is the key to trying to get this to heal up completely. 05/24/18 on evaluation today patient actually appears to be doing worse at this point in time unfortunately compared to her last week's evaluation. She is having really no increased pain which is good news unfortunately she does have more maceration in your theme and noted surrounding the right lateral ankle the left lateral ankle is not really is erythematous I do not see signs of the overt cellulitis on that side. Unfortunately the wounds do not seem to have shown any signs of improvement since the last evaluation. She also has significant swelling especially on the right compared to previous some of this may be due to infection however also think that she may be served better while she has these wounds by compression wrapping versus continuing to use the Juxta-Lite for the time being. Especially with the amount of drainage that she is experiencing at this point. No fevers, chills, nausea, or vomiting noted at this time. 05/31/18 on evaluation today patient appears to actually be doing better in regard to her right lateral lower extremity ulcer specifically on the malleolus region. She has been tolerating the antibiotic without complication. With that being said she  still continues to have issues but a little bit of redness although nothing like she what she was experiencing previous. She still continues to pressure to her ankle area she did get the problem on offloading boots unfortunately she will not wear them she states there too uncomfortable  and she can't get in and out of the bed. Nonetheless at this point her wounds seem to be continually getting worse which is not what we want I'm getting somewhat concerned about her progress and how things are going to proceed if we do not intervene in some way shape or form. I therefore had a very lengthy conversation today about offloading yet again and even made a specific suggestion for switching her to a memory foam mattress and even gave the information for a specific one that they could look at getting if it was something that they were interested in considering. She does not want to be considered for a hospital bed air mattress although honestly insurance would not cover it that she does not have any wounds on her trunk. 06/14/18 on evaluation today both wounds over the bilateral lateral malleolus her ulcers appear to be doing better there's no evidence of pressure injury at this point. She did get the foam mattress for her bed and this does seem to have been extremely beneficial for her in my pinion. Her daughter states that she is having difficulty getting out of bed because of how soft it is. The patient also relates this to be. Nonetheless I do feel like she's actually doing better. Unfortunately right after and around the time she was getting the mattress she also sustained a fall when she got up to go pick up the phone and ended up injuring her right elbow she has 18 sutures in place. We are not caring for this currently although home health is going to be taking the sutures out shortly. Nonetheless this may be something that we need to evaluate going forward. It depends on [name], [name]  ([id number]) how well it has or has not healed in the end. She also recently saw an orthopedic specialist for an injection in the right shoulder just before her fall unfortunately the fall seems to have worsened her pain. 06/21/18 on evaluation today patient appears to be doing about the same in regard to her lateral malleolus ulcers. Both appear to be just a little bit deeper but again we are clinging away the necrotic and dead tissue which I think is why this is progressing towards a deeper realm as opposed to improving from my measurement standpoint in that regard. Nonetheless she has been tolerating the dressing changes she absolutely hates the memory foam mattress topper that was obtained for her nonetheless I do believe this is still doing excellent as far as taking care of excess pressure in regard to the lateral malleolus regions. She in fact has no pressure injury that I see whereas in weeks past it was week by week I was constantly seeing new pressure injuries. Overall I think it has been very beneficial for her. 07/03/18; patient arrives in my clinic today. She has deep punched out areas over her bilateral lateral malleoli. The area on the right has some more depth. We spent a lot of time today talking about pressure relief for these areas. This started when her daughter asked for a prescription for a memory foam mattress. I have never written a prescription for a mattress and I don't think insurances would pay for that on an ordinary bed. In any case he came up that she has foam boots that she refuses to wear. I would suggest going to these before any other offloading issues when she is in bed. They say she is meticulous about offloading this the rest of the day  07/10/18- She is seen in follow-up evaluation for bilateral, lateral malleolus ulcers. There is no improvement in the ulcers. She has purchased and is sleeping on a memory foam mattress/overlay, she has been using the offloading boots  nightly over the past week. She has a follow up appointment with vascular medicine at the end of October, in my opinion this follow up should be expedited given her deterioration and suboptimal TBI results. We will order plain film xray of the left ankle as deeper structures are palpable; would consider having MRI, regardless of xray report(s). The ulcers will be treated with iodoflex/iodosorb, she is unable to safely change the dressings daily with santyl. 07/19/18 on evaluation today patient appears to be doing in general visually well in regard to her bilateral lateral malleolus ulcers. She has been tolerating the dressing changes without complication which is good news. With that being said we did have an x-ray performed on 07/12/18 which revealed a slight loosen see in the lateral portion of the distal left fibula which may represent artifact but underline lytic destruction or osteomyelitis could not be excluded. MRI was recommended. With that being said we can see about getting the patient scheduled for an MRI to further evaluate this area. In fact we have that scheduled currently for August 20 19,019. 07/26/18 on evaluation today patient's wound on the right lateral ankle actually appears to be doing fairly well at this point in my pinion. She has made some good progress currently. With that being said unfortunately in regard to the left lateral ankle ulcer this seems to be a little bit more problematic at this time. In fact as I further evaluated the situation she actually had bone exposed which is the first time that's been the case in the bone appear to be necrotic. Currently I did review patient's note from Dr. Bunnie Domino office with [facility]. He stated that ABI was 1.26 on the right and 0.95 on the left with good waveforms. Her perfusion is stable not reduced from previous studies and her digital waveforms were pretty good particularly on the right. His conclusion upon  review of the note was that there was not much she could do to improve her perfusion and he felt she was adequate for wound healing. His suggestion was that she continued to see [place] and consider a synthetic skin graft if there was no underlying infection. He plans to see her back in six months or as needed. 08/01/18 on evaluation today patient appears to be doing better in regard to her right lateral ankle ulcer. Her left lateral ankle ulcer is about the same she still has bone involvement in evidence of necrosis. There does not appear to be evidence of infection at this time On the right lateral lower extremity. I have started her on the Augmentin she picked this up and started this yesterday. This is to get her through until she sees infectious disease which is scheduled for 08/12/18. 08/06/18 on evaluation today patient appears to be doing rather well considering my discussion with patient's daughter at the end of last week. The area which was marked where she had erythema seems to be improved and this is good news. With that being said overall the patient seems to be making good improvement when it comes to the overall appearance of the right lateral ankle ulcer although this has been slow she at least is coming around in this regard. Unfortunately in regard to the left lateral ankle ulcer this  is osteomyelitis based on the pathology report as well is bone culture. Nonetheless we are still waiting CT scan. Unfortunately the MRI we originally ordered cannot be performed as the patient is a pacemaker which I had overlooked. Nonetheless we are working on the CT scan approval and scheduling as of now. She did go to the hospital over the weekend and was placed on IV Cefzo for a couple of days. Fortunately this seems to have improved the erythema quite significantly which is good news. There does not appear to be any evidence of worsening infection at this time. She did have some bleeding after the  last debridement therefore I did not perform any sharp debridement in regard to left lateral ankle at this point. Patient has been approved for a snap vac for the right lateral ankle. 08/14/18; the patient with wounds over her bilateral lateral malleoli. The area on the right actually looks quite good. Been using a snap back on this area. Healthy granulation and appears to be filling in. Unfortunately the area on the left is really problematic. 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 Also noted to have cellulitis. She saw Dr. Novella Olive of infectious disease today and was put on linezolid. We are able to verify this with her pharmacy. She is completed the Augmentin that she was already on. We've been using Iodoflex to this area 08/23/18 on evaluation today patient's wounds both actually appear to be doing better compared to my prior evaluations. Fortunately she showing signs of good improvement in regard to the overall wound status especially where were using the snap vac on the right. In regard to left lateral malleolus the wound bed actually appears to be much cleaner than previously noted. I do not feel any phone directly probed during evaluation today and though there is tendon noted this does not appear to be necrotic it's actually fairly good as far as the overall appearance of the tendon is concerned. In general the wound bed actually appears to be doing significantly better than it was previous. Patient is currently in the care of [name] and I did review that note today. He actually has her on two weeks of linezolid and then following the patient will be on 1-2 months of Keflex. That is the plan currently. She has been on antibiotics therapy as prescribed by myself initially starting on July 30, 2018 and has been on that continuously up to this point. 08/30/18 on evaluation today patient actually appears to be  doing much better in regard to her right lateral malleolus ulcer. She has been tolerating the dressing changes specifically the snap vac without complication although she did have some issues with the seal currently. Apparently there was some trouble with getting it to maintain over the past week past Sunday. Nonetheless overall the wound appears better in regard to the right lateral malleolus region. In regard to left lateral malleolus this actually show some signs of additional granulation although there still tendon noted in the base of the wound this appears to be healthy not necrotic in any way whatsoever. We are considering potentially using a snap vac for the left lateral malleolus as well the product wrap from [employer], [place], was present in the clinic today we're going to see this patient I did have her come in with me after obtaining consent from the patient and her daughter in order to look at the wound and  see if there's any recommendation one way or another as to whether or not they felt the snapback could be beneficial for the left lateral malleolus region. But [name], [name] ([id number]) the conclusion was that it might be but that this is definitely a little bit deeper wound than what traditionally would be utilized for a snap vac. 09/06/18 on evaluation today patient actually appears to be doing excellent in my pinion in regard to both ankle ulcers. She has been tolerating the dressing changes without complication which is great news. Specifically we have been using the snap vac. In regard to the right ankle I'm not even sure that this is going to be necessary for today and following as the wound has filled in quite nicely. In regard to the left ankle I do believe that we're seeing excellent epithelialization from the edge as well as granulation in the central portion the tendon is still exposed but there's no evidence of necrotic bone and in general I feel like the patient has made  excellent progress even compared to last week with just one week of the snap vac. 09/11/18; this is a patient who has wounds on her bilateral lateral malleoli. Initially both of these were deep stage IV wounds in the setting of chronic arterial insufficiency. She has been revascularized. As I understand think she been using snap vacs to both of these wounds however the area on the right became more superficial and currently she is only using it on the left. Using silver collagen on the right and silver collagen under the back on the left I believe 09/19/18 on evaluation today patient actually appears to be doing very well in regard to her lateral malleolus or ulcers bilaterally. She has been tolerating the dressing changes without complication. Fortunately there does not appear to be any evidence of infection at this time. Overall I feel like she is improving in an excellent manner and I'm very pleased with the fact that everything seems to be turning towards the better for her. This has obviously been a long road. 09/27/18 on evaluation today patient actually appears to be doing very well in regard to her bilateral lateral malleolus ulcers. She has been tolerating the dressing changes without complication. Fortunately there does not appear to be any evidence of infection at this time which is also great news. No fevers, chills, nausea, or vomiting noted at this time. Overall I feel like she is doing excellent with the snap vac on the left malleolus. She had 40 mL of fluid collection over the past week. 10/04/18 on evaluation today patient actually appears to be doing well in regard to her bilateral lateral malleolus ulcers. She continues to tolerate the dressing changes without complication. One issue that I see is the snap vac on the left lateral malleolus which appears to have sealed off some fluid underlying this area and has not really allowed it to heal to the degree that I would like to see. For  that reason I did suggest at this point we may want to pack a small piece of packing strip into this region to allow it to more effectively wick out fluid. 10/11/18 in general the patient today does not feel that she has been doing very well. She's been a little bit lethargic and subsequently is having bodyaches as well according to what she tells me today. With that being said overall she has been concerned with the fact that something may be worsening although to be honest  her wounds really have not been appearing poorly. She does have a new ulcer on her left heel unfortunately. This may be pressure related. Nonetheless it seems to me to have potentially started at least as a blister I do not see any evidence of deep tissue injury. In regard to the left ankle the snap vac still seems to be causing the ceiling off of the deeper part of the wound which is in turn trapping fluid. I'm not extremely pleased with the overall appearance as far as progress from last week to this week therefore I'm gonna discontinue the snap vac at this point. 10/18/18 patient unfortunately this point has not been feeling well for the past several days. She was seen by Grayland Ormond her primary care provider who is a Librarian, academic at [employer]. Subsequently she states that she's been very weak and generally feeling malaise. No fevers, chills, nausea, or vomiting noted at this time. With that being said bloodwork was performed at the PCP office on the 11th of this month which showed a white blood cell count of 10.7. This was repeated today and shows a white blood cell count of 12.4. This does show signs of worsening. Coupled with the fact that she is feeling worse and that her left ankle wound is not really showing signs of improvement I feel like this is an indication that the osteomyelitis is likely exacerbating not improving. Overall I think we may also want to check her C-reactive protein and sedimentation  rate. Actually did call [facility] office this afternoon while the patient was in the office here with me. Subsequently based on the findings we discussed treatment possibilities and I think that it is appropriate for [place] to go ahead and initiate treatment with doxycycline which I'm going to do. Subsequently he did agree to see about adding a CRP and sedimentation rate to her orders. If that has not already been drawn to where they can run it they will contact the patient she can come back to have that check. They are in agreement with plan as far as the patient and her daughter are concerned. Nonetheless also think we need to get in touch with Dr. Henreitta Leber office to see about getting the patient scheduled with him as soon as possible. 11/08/18 on evaluation today patient presents for follow-up concerning her bilateral foot and ankle ulcers. I did do an extensive review of her chart in epic today. Subsequently she was seen by [name] he did initiate Cefepime IV antibiotic therapy. Subsequently she had some issues with her PICC line this had to be removed because it was coiled and then replaced. Fortunately that was now settled. Unfortunately she has continued have issues with her left heel as well as the issues that she is experiencing with her bilateral lateral malleolus regions. I do believe however both areas seem to be doing a little bit better on evaluation today which is good news. No fevers, chills, nausea, or vomiting noted at this time. She actually has an angiogram schedule with Dr. dew on this coming Monday, November 11, 2018. Subsequently the patient states that she is feeling much better especially than what she was roughly 2 weeks ago. She actually had to cancel an appointment because she was feeling [name] poorly. No fevers, chills, nausea, or vomiting noted at this time. 11/15/18 on evaluation today patient actually is status post having had her angiogram with Dr. dew Monday, four days  ago. It was noted that she had  60 to 80% stenosis noted in the extremity. He had to go and work on several areas of the vasculature fortunately he was able to obtain no more than a 30% residual stenosis throughout post procedure. I reviewed this note today. I think this will definitely help with healing at this time. Fortunately there does not appear to be any signs of infection and I do feel like ratio already has a better appearance to it. 11/22/18 upon evaluation today patient actually appears to be doing very well in regard to her wounds in general. The right lateral malleolus looks excellent the heel looks better in the left lateral malleolus also appears to be doing a little better. With that being said the right second toe actually appears to be open and training we been watching this is been dry and stable but now is open. 12/03/2018 Seen today for follow-up and management of multiple bilateral lower extremity wounds. New pressure injury of the great toe which is closed at this time. Wound of the right distal second toe appears larger today with deep undermining and a pocket of fluid present within the undermining region. Left and right malleolus is wounds are stable today with no signs and symptoms of infection.Denies any needs or concerns during exam today. 12/13/18 on evaluation today patient appears to be doing somewhat better in regard to her left heel ulcer. She also seems to be completely healed in regard to the right lateral malleolus ulcer. The left malleolus ulcer is smaller what unfortunately the wounds which are new over the first and second toes of the right foot are what are most concerning at this point especially the second. Both areas did require sharp debridement today. 12/20/18 on evaluation today patient's wound actually appears to be doing better in regard to left lateral ankle and her right lateral ankle continues to remain healed. The hill ulcer on the left is improved.  She does have improvement noted as well in regard to both toe ulcers. Overall I'm very pleased in this regard. No fevers, chills, nausea, or vomiting noted at this time. [name], [name] ([id number]) 12/23/18 on evaluation today patient is seen after she had her toenails trimmed at the podiatrist office due to issues with her right great toe. There was what appeared to be dark eschar on the surface of the wound which had her in the podiatrist concerned. Nonetheless as I remember that during the last office visit I had utilize silver nitrate of this area I was much less concerned about the situation. Subsequently I was able to clean off much of this tissue without any complication today. This does not appear to show any signs of infection and actually look somewhat better compared to last time post debridement. Her second toe on the right foot actually had callous over and there did appear still be some fluid underneath this that would require debridement today. 12/27/18 on evaluation today patient actually appears to be showing signs of improvement at all locations. Even the left lateral ankle although this is not quite as great as the other sites. Fortunately there does not appear to be any signs of infection at this time and both of her toes on the right foot seem to be showing signs of improvement which is good news and very pleased in this regard. 01/03/19 on evaluation today patient appears to be doing better for the most part in regard to her wounds in particular. There does not appear to be any evidence of infection at  this time which is good news. Fortunately there is no sign of really worsening anywhere except for the right great toe which she does have what appears to be a bruise/deep tissue injury which is very superficial and already resolving. I'm not sure where this came from I questioned her extensively and she does not recall what may have happened with this. Other than that the patient  seems to be doing well even the left lateral ankle ulcer looks good and is getting smaller. 01/10/19 on evaluation today patient appears to be doing well in regard to her left heel wound and both of her toe wounds. Overall I feel like there is definitely improvement here and I'm happy in that regard. With that being said unfortunately she is having issues with the left lateral malleolus ulcer which unfortunately still has a lot of depth to it. This is gonna be a very difficult wound for [place] to be able to truly get to heal. I may want to consider some type of skin substitute to see if this would be of benefit for her. I'll discuss this with her more the next visit most likely. This was something I thought about more at the end of the visit when I was Artie out of the room and the patient had been discharged. 01/17/19 on evaluation today patient appears to be doing very well in regard to her wounds in general. She's been making excellent progress at this time. Fortunately there's no sign of infection at this time either. No fevers, chills, nausea, or vomiting noted at this time. The biggest issue is still her left lateral malleolus where it appears to be doing well and is getting smaller but still shows a small corner where this is deeper and goes down into what appears to be the joint space. Nonetheless this is taking much longer to heal although it still looks better in smaller than previous evaluations. 01/24/19 on evaluation today patient's wounds actually appear to be doing rather well in general overall. She did require some sharp debridement in regard to the right great toe but everything else appears to be doing excellent no debridement was even necessary. No fevers, chills, nausea, or vomiting noted at this time. 01/31/19 on evaluation today patient actually appears to be doing much better in regard to her left foot wound on the heel as well as the ankle. The right great toe appears to be a little  bit worse today this had callous over and trapped a lot of fluid underneath. Fortunately there's no signs of infection at any site which is great news. 02/07/19 on evaluation today patient actually appears to be doing decently well in regard to all of her ulcers at this point. No sharp debridement was required she is a little bit of hyper granulation in regard to the left lateral ankle as well as the left heel but the hill itself is almost completely healed which is excellent news. Overall been very pleased in this regard. 02/14/19 on evaluation today patient actually appears to be doing very well in regard to her ulcers on the right first toe, left lateral malleolus, and left heel. In fact the heel is almost completely healed at this point. The patient does not show any signs of infection which is good news. Overall very pleased with how things have progressed. 04/18/19 Telehealth Evaluation During the COVID-19 National Emergency: Verbal Consent: Obtained from patient Allergies: reviewed and the active list is current. Medication changes: patient has no current medication  changes. COVID-19 Screening: 1. Have you traveled internationally or on a cruise ship in the last 14 dayso No 2. Have you had contact with someone with or under investigation for COVID-19o No 3. Have you had a fever, cough, sore throat, or experiencing shortness of breatho No on evaluation today actually did have a visit with this patient through a telehealth encounter with her home health nurse. Subsequently it was noted that the patient actually appears to be doing okay in regard to her wounds both the right great toe as well as the left lateral malleolus have shown signs of improvement although this in your theme around the left lateral malleolus there eschar coverings for both locations. The question is whether or not they are actually close and whether or not home health needs to discharge the patient or not. Nonetheless my  concern is this point obviously is that without actually seeing her and being able to evaluate this directly I cannot ensure that she is completely healed which is the question that I'm being asked. 04/22/19 on evaluation today patient presents for her first evaluation since last time I saw her which was actually February 14, 2019. I did do a telehealth visit last week in which point it was questionable whether or not she may be healed and had to bring her in today for confirmation. With that being said she does seem to be doing quite well at this point which is good news. There does not appear to be any drainage in the deed I believe her wounds may be healed. Readmission: 09/04/2019 on evaluation today patient appears to be doing unfortunately somewhat more poorly in regard to her left foot ulcer secondary to a wound that began on 08/21/2019 at least when she first noticed this. Fortunately she has not had any evidence of active infection at this time. Systemically. I also do not necessarily see any evidence of infection at the blister/wound site on the first metatarsal head plantar aspect. This almost appears to be something that may have just rubbed inappropriately causing this to breakdown. They did not want a wait too long to come in to be seen as again she had significant issues in the past with wounds that took quite a while to heal in fact it was close to 2 years. Nonetheless this does not appear to be quite that bad but again we do need to remove some of the necrotic tissue from the surface of the wound to tell exactly the extent. She does not appear to have any significant arterial disease at this point and again her last ABIs and TBI's are recorded above in the alert section her left ABI was 1.27 with a TBI of 0.72 to the right ABI 1.08 with a TBI of 0.39. Other than this the patient has been doing quite well since I last saw her and that was in May 2020. [name], [name]  ([id number]) 09/11/2019 on evaluation today patient appeared to be doing very well with regard to her plantar foot ulcer on the left. In fact this appears to be almost completely healed which is awesome. That is after just 1 week of intervention. With that being said there is no signs of active infection at this time. 09/18/2019 on evaluation today patient actually appears to be doing excellent in fact she is completely healed based on what I am seeing at this point. Fortunately there is no signs of active infection at this time and overall patient is very pleased to  hear that this area has healed [name] quickly. Readmission: 05/13/2021 upon evaluation today this patient presents for reevaluation here in the clinic. This is a wound that actually we previously took care of. She had 1 on the right ankle and the left the left turned out to be be harder due to to heal but nonetheless is doing great at this point as the right that has reopened and it was noted first just several weeks ago with a scab over it and came off in just the past few days. Fortunately there does not appear to be any obvious evidence of significant active infection at this time which is great news. No fevers, chills, nausea, vomiting, or diarrhea. 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 There does not appear to be any signs of this interfering in any way with her wound. She does have swelling we previously had compression socks for her ordered but again it does not look like she wears these on a regular basis by any means. 05/26/2021 upon evaluation today patient appears to be doing well with regard to her wound which is actually showing signs of excellent improvement. There does not appear to be any signs of active infection which is great news and overall very pleased with where things stand today. No fevers, chills, nausea, vomiting, or diarrhea. 06/02/2021 upon evaluation today  patient's wound actually showing signs of excellent improvement. Fortunately there does not appear to be any signs of active infection which is great news. I think the patient is making good progress with regard to her wounds in general. 06/09/2021 upon evaluation today patient appears to be doing excellent in regard to her wounds currently. Fortunately there does not appear to be any signs of active infection which is great news. No fevers, chills, nausea, vomiting, or diarrhea. Overall extremely pleased with where things stand today. I think the patient is making excellent progress. 06/16/2021 upon evaluation today patient appears to be doing well in regard to her wound. This is going require little bit of debridement today and that was discussed with the patient. Otherwise she seems to be doing quite well and I am actually very pleased with where things stand at this point. No fevers, chills, nausea, vomiting, or diarrhea. 06/23/2021 upon evaluation today patient appears to be doing well with regard to her wounds. She has been tolerating the dressing changes without complication. Fortunately there does not appear to be any evidence of infection and she has not had air in her home which she actually lives at an assisted living that got fixed this morning. With that being said because of that her wrap has been extremely hot and bothersome for her over the past week. 06/30/2021 upon evaluation today patient is actually making excellent progress in regard to her ankle ulcer. She has been tolerating the dressing changes without complication and overall extremely pleased with where things stand there does not appear to be any evidence of active infection which is great news. No fevers, chills, nausea, vomiting, or diarrhea. Objective Constitutional Well-nourished and well-hydrated in no acute distress. Vitals Time Taken: 11:15 AM, Height: 62 in, Weight: 150 lbs, BMI: 27.4, Temperature: 98.5 F, Pulse: 88  bpm, Respiratory Rate: 18 breaths/min, Blood Pressure: 134/76 mmHg. Respiratory normal breathing without difficulty. Psychiatric this patient is able to make decisions and demonstrates good insight into disease process. Alert and Oriented x 3. pleasant and cooperative. General Notes: Upon inspection patient's wound seems to  be doing excellent and I feel like the surface of the wound is great her swelling is also well under control with compression on the right. She really needs to be wearing a compression sock on the left and I discussed that with her today as well. Her daughter was present during this discussion. Nonetheless she does have Velcro compression wraps at home but she has not been wearing them to get a look for them before the next time she comes back into the clinic. Integumentary (Hair, Skin) Wound #7 status is Open. Original cause of wound was Gradually Appeared. The date acquired was: 05/12/2021. The wound has been in treatment 6 weeks. The wound is located on the Right,Lateral Malleolus. The wound measures 0.5cm length x 0.6cm width x 0.2cm depth; 0.236cm^2 area and 0.047cm^3 volume. There is Fat Layer (Subcutaneous Tissue) exposed. There is no tunneling or undermining noted. There is a medium amount of serous drainage noted. The wound margin is distinct with the outline attached to the wound base. There is large (67-100%) red, pink, pale granulation within the wound bed. There is a small (1-33%) amount of necrotic tissue within the wound bed including Adherent Slough. [name], [name] ([id number]) Assessment Active Problems ICD-10 Pressure ulcer of right ankle, stage 3 Lymphedema, not elsewhere classified Venous insufficiency (chronic) (peripheral) Presence of cardiac pacemaker Long term (current) use of anticoagulants Procedures Wound #7 Pre-procedure diagnosis of Wound #7 is a Pressure Ulcer located on the Right,Lateral Malleolus . There was a Three Layer Compression  Therapy Procedure by Carlene Coria, RN. Post procedure Diagnosis Wound #7: Same as Pre-Procedure Plan Follow-up Appointments: Return Appointment in 1 week. Nurse Visit as needed Bathing/ Shower/ Hygiene: May shower with wound dressing protected with water repellent cover or cast protector. Edema Control - Lymphedema / Segmental Compressive Device / Other: Optional: One layer of unna paste to top of compression wrap (to act as an anchor). Elevate, Exercise Daily and Avoid Standing for Long Periods of Time. Elevate legs to the level of the heart and pump ankles as often as possible Elevate leg(s) parallel to the floor when sitting. WOUND #7: - Malleolus Wound Laterality: Right, Lateral Cleanser: Soap and Water 1 x Per Week/30 Days Discharge Instructions: Gently cleanse wound with antibacterial soap, rinse and pat dry prior to dressing wounds Primary Dressing: Prisma 4.34 (in) 1 x Per Week/30 Days Discharge Instructions: Moisten w/normal saline or sterile water; Cover wound as directed. Do not remove from wound bed. Secondary Dressing: ABD Pad 5x9 (in/in) 1 x Per Week/30 Days Discharge Instructions: Cover with ABD pad Compression Wrap: Profore Lite LF 3 Multilayer Compression Bandaging System 1 x Per Week/30 Days Discharge Instructions: Apply 3 multi-layer wrap as prescribed. 1. Would recommend currently that we have the patient continue with the silver collagen which I think is doing a great job. 2. Also can recommend covering this with an ABD pad followed by 3 layer compression wrap this is keeping her edema under good control. 3. I am also can recommend she should be wearing her Velcro compression wrap on the left leg she should have these at home they just need to find them. We will see patient back for reevaluation in 1 week here in the clinic. If anything worsens or changes patient will contact our office for additional recommendations. Electronic Signature(s) Signed: 06/30/2021  3:42:39 PM By: Worthy Keeler PA-C Entered By: Worthy Keeler on 06/30/2021 15:42:38 Stoermer, [name] ([id number]) -------------------------------------------------------------------------------- SuperBill Details Patient Name: [name] Date of  Service: 06/30/2021 Medical Record Number: [id number] Patient Account Number: [id number] Date of Birth/Sex: 15-Apr-1923 (87 y.o. F) Treating RN: Carlene Coria Primary Care Provider: Adrian Prows Other Clinician: Referring Provider: Adrian Prows Treating Provider/Extender: Skipper Cliche in Treatment: 6 Diagnosis Coding ICD-10 Codes Code Description [phone] Pressure ulcer of right ankle, stage 3 I89.0 Lymphedema, not elsewhere classified I87.2 Venous insufficiency (chronic) (peripheral) Z95.0 Presence of cardiac pacemaker Z79.01 Long term (current) use of anticoagulants Facility Procedures CPT4 Code: [id number] Description: (Facility Use Only) [phone] - [place] LWR RT LEG Modifier: Quantity: 1 Physician Procedures CPT4 Code: [id number] Description: [id number] - WC PHYS LEVEL 3 - EST PT Modifier: Quantity: 1 CPT4 Code: Description: ICD-10 Diagnosis Description 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, stage 3 I89.0 Lymphedema, not elsewhere classified I87.2 Venous insufficiency (chronic) (peripheral) Z95.0 Presence of cardiac pacemaker Modifier: Quantity: Electronic Signature(s) Signed: 06/30/2021 3:42:50 PM By: Worthy Keeler PA-C Entered By: Worthy Keeler on 06/30/2021 15:42:50

## 2021-06-30 NOTE — Progress Notes (Addendum)
[name], [name] ([id number]) Visit Report for 06/30/2021 Arrival Information Details Patient Name: [name], [name] Date of Service: 06/30/2021 11:00 AM Medical Record Number: [id number] Patient Account Number: [id number] Date of Birth/Sex: 06/19/1923 (87 y.o. F) Treating RN: Carlene Coria Primary Care Alany Borman: Adrian Prows Other Clinician: Referring Wyolene Weimann: Adrian Prows Treating Phillipe Clemon/Extender: Skipper Cliche in Treatment: 6 Visit Information History Since Last Visit All ordered tests and consults were completed: No Patient Arrived: [name] Added or deleted any medications: No Arrival Time: 11:02 Any new allergies or adverse reactions: No Accompanied By: self Had a fall or experienced change in No Transfer Assistance: None activities of daily living that may affect Patient Identification Verified: Yes risk of falls: Secondary Verification Process Completed: Yes Signs or symptoms of abuse/neglect since last visito No Patient Requires Transmission-Based No Hospitalized since last visit: No Precautions: Implantable device outside of the clinic excluding No Patient Has Alerts: Yes cellular tissue based products placed in the center Patient Alerts: Patient on Blood since last visit: Thinner Has Dressing in Place as Prescribed: Yes NOT DIABETIC Pain Present Now: No ***ELIQUIS*** Electronic Signature(s) Signed: 07/01/2021 4:32:50 PM By: Carlene Coria RN Entered By: Carlene Coria on 06/30/2021 11:15:09 Cuny, [name] ([id number]) -------------------------------------------------------------------------------- Clinic Level of Care Assessment Details Patient Name: [name] Date of Service: 06/30/2021 11:00 AM Medical Record Number: [id number] Patient Account Number: [id number] Date of Birth/Sex: 1923-08-30 (87 y.o. F) Treating RN: Carlene Coria Primary Care Nethra Mehlberg: Adrian Prows Other Clinician: Referring Ramonita Koenig: Adrian Prows Treating  Cristal Qadir/Extender: Skipper Cliche in Treatment: 6 Clinic Level of Care Assessment Items TOOL 1 Quantity Score '[]'  - Use when EandM and Procedure is performed on INITIAL visit 0 ASSESSMENTS - Nursing Assessment / Reassessment '[]'  - General Physical Exam (combine w/ comprehensive assessment (listed just below) when performed on new 0 pt. evals) '[]'  - 0 Comprehensive Assessment (HX, ROS, Risk Assessments, Wounds Hx, etc.) ASSESSMENTS - Wound and Skin Assessment / Reassessment '[]'  - Dermatologic / Skin Assessment (not related to wound area) 0 ASSESSMENTS - Ostomy and/or Continence Assessment and Care '[]'  - Incontinence Assessment and Management 0 '[]'  - 0 Ostomy Care Assessment and Management (repouching, etc.) PROCESS - Coordination of Care '[]'  - Simple Patient / Family Education for ongoing care 0 '[]'  - 0 Complex (extensive) Patient / Family Education for ongoing care '[]'  - 0 Staff obtains Programmer, systems, Records, Test Results / Process Orders '[]'  - 0 Staff telephones HHA, Nursing Homes / Clarify orders / etc '[]'  - 0 Routine Transfer to another Facility (non-emergent condition) '[]'  - 0 Routine Hospital Admission (non-emergent condition) '[]'  - 0 New Admissions / Biomedical engineer / Ordering NPWT, Apligraf, etc. '[]'  - 0 Emergency Hospital Admission (emergent condition) PROCESS - Special [name] '[]'  - Pediatric / Minor Patient Management 0 '[]'  - 0 Isolation Patient Management '[]'  - 0 Hearing / Language / Visual special [name] '[]'  - 0 Assessment of Community assistance (transportation, D/C planning, etc.) '[]'  - 0 Additional assistance / Altered mentation '[]'  - 0 Support Surface(s) Assessment (bed, cushion, seat, etc.) INTERVENTIONS - Miscellaneous '[]'  - External ear exam 0 '[]'  - 0 Patient Transfer (multiple staff / Civil Service fast streamer / Similar devices) '[]'  - 0 Simple Staple / Suture removal (25 or less) '[]'  - 0 Complex Staple / Suture removal (26 or more) '[]'  - 0 Hypo/Hyperglycemic Management (do not  check if billed separately) '[]'  - 0 Ankle / Brachial Index (ABI) - do not check if billed separately Has the patient been seen at the hospital within  the last three years: Yes Total Score: 0 Level Of Care: ____ [name] ([id number]) Electronic Signature(s) Signed: 07/01/2021 4:32:50 PM By: Carlene Coria RN Entered By: Carlene Coria on 06/30/2021 11:27:57 Studzinski, [name] ([id number]) -------------------------------------------------------------------------------- Compression Therapy Details Patient Name: [name] Date of Service: 06/30/2021 11:00 AM Medical Record Number: [id number] Patient Account Number: [id number] Date of Birth/Sex: 16-Nov-1923 (87 y.o. F) Treating RN: Carlene Coria Primary Care Royce Sciara: Adrian Prows Other Clinician: Referring Tashawna Thom: Adrian Prows Treating Renaud Celli/Extender: Skipper Cliche in Treatment: 6 Compression Therapy Performed for Wound Assessment: Wound #7 Right,Lateral Malleolus Performed By: Jake Church, RN Compression Type: Three Layer Post Procedure Diagnosis Same as Pre-procedure Electronic Signature(s) Signed: 07/01/2021 4:32:50 PM By: Carlene Coria RN Entered By: Carlene Coria on 06/30/2021 11:27:47 Vawter, [name] ([id number]) -------------------------------------------------------------------------------- Encounter Discharge Information Details Patient Name: [name] Date of Service: 06/30/2021 11:00 AM Medical Record Number: [id number] Patient Account Number: [id number] Date of Birth/Sex: February 04, 1923 (87 y.o. F) Treating RN: Carlene Coria Primary Care Tamon Parkerson: Adrian Prows Other Clinician: Referring Donise Woodle: Adrian Prows Treating Madylin Fairbank/Extender: Skipper Cliche in Treatment: 6 Encounter Discharge Information Items Discharge Condition: Stable Ambulatory Status: Walker Discharge Destination: Home Transportation: Private Auto Accompanied By: self Schedule Follow-up  Appointment: Yes Clinical Summary of Care: Patient Declined Electronic Signature(s) Signed: 07/01/2021 4:32:50 PM By: Carlene Coria RN Entered By: Carlene Coria on 06/30/2021 11:29:35 Scantlin, [name] ([id number]) -------------------------------------------------------------------------------- Lower Extremity Assessment Details Patient Name: [name] Date of Service: 06/30/2021 11:00 AM Medical Record Number: [id number] Patient Account Number: [id number] Date of Birth/Sex: [birth year] (87 y.o. F) Treating RN: Carlene Coria Primary Care Malky Rudzinski: Adrian Prows Other Clinician: Referring Chealsey Miyamoto: Adrian Prows Treating Jaydence Arnesen/Extender: Skipper Cliche in Treatment: 6 Edema Assessment Assessed: [Left: No] [Right: No] Edema: [Left: Ye] [Right: s] Calf Left: Right: Point of Measurement: 33 cm From Medial Instep 29 cm Ankle Left: Right: Point of Measurement: 10 cm From Medial Instep 17 cm Vascular Assessment Pulses: Dorsalis Pedis Palpable: [Right:Yes] Electronic Signature(s) Signed: 07/01/2021 4:32:50 PM By: Carlene Coria RN Entered By: Carlene Coria on 06/30/2021 11:17:41 Laine, [name] ([id number]) -------------------------------------------------------------------------------- Multi Wound Chart Details Patient Name: [name] Date of Service: 06/30/2021 11:00 AM Medical Record Number: [id number] Patient Account Number: [id number] Date of Birth/Sex: 16-Nov-1923 (87 y.o. F) Treating RN: Carlene Coria Primary Care Kendra Grissett: Adrian Prows Other Clinician: Referring Slayde Brault: Adrian Prows Treating Aala [name]/Extender: Skipper Cliche in Treatment: 6 Vital Signs Height(in): 56 Pulse(bpm): 38 Weight(lbs): 150 Blood Pressure(mmHg): 134/76 Body Mass Index(BMI): 27 Temperature(F): 98.5 Respiratory Rate(breaths/min): 18 Photos: [N/A:N/A] Wound Location: Right, Lateral Malleolus N/A N/A Wounding Event: Gradually Appeared N/A N/A Primary  Etiology: Pressure Ulcer N/A N/A Comorbid History: Cataracts, Congestive Heart Failure, N/A N/A Hypertension, Peripheral Arterial Disease, Osteoarthritis, Neuropathy Date Acquired: 05/12/2021 N/A N/A Weeks of Treatment: 6 N/A N/A Wound Status: Open N/A N/A Measurements L x W x D (cm) 0.5x0.6x0.2 N/A N/A Area (cm) : 0.236 N/A N/A Volume (cm) : 0.047 N/A N/A % Reduction in Area: 79.00% N/A N/A % Reduction in Volume: 79.10% N/A N/A Classification: Category/Stage III N/A N/A Exudate Amount: Medium N/A N/A Exudate Type: Serous N/A N/A Exudate Color: amber N/A N/A Wound Margin: Distinct, outline attached N/A N/A Granulation Amount: Large (67-100%) N/A N/A Granulation Quality: Red, Pink, Pale N/A N/A Necrotic Amount: Small (1-33%) N/A N/A Exposed Structures: Fat Layer (Subcutaneous Tissue): N/A N/A Yes Fascia: No Tendon: No Muscle: No Joint: No Bone: No Epithelialization: Small (1-33%) N/A N/A Treatment Notes Electronic Signature(s) Signed: 07/01/2021  4:32:50 PM By: Carlene Coria RN Entered By: Carlene Coria on 06/30/2021 11:25:11 Spicher, [name] ([id number]) -------------------------------------------------------------------------------- [name] Details Patient Name: [name], [name] Date of Service: 06/30/2021 11:00 AM Medical Record Number: [id number] Patient Account Number: [id number] Date of Birth/Sex: [birth year] (87 y.o. F) Treating RN: Carlene Coria Primary Care Rita Vialpando: Adrian Prows Other Clinician: Referring Zilda No: Adrian Prows Treating Shantrell Placzek/Extender: Skipper Cliche in Treatment: 6 Active Inactive Wound/Skin Impairment Nursing Diagnoses: Knowledge deficit related to ulceration/compromised skin integrity Goals: Patient/caregiver will verbalize understanding of skin care regimen Date Initiated: 05/13/2021 Date Inactivated: 05/13/2021 Target Resolution Date: 06/12/2021 Goal Status: Met Ulcer/skin breakdown will have a volume  reduction of 30% by week 4 Date Initiated: 05/13/2021 Date Inactivated: 06/16/2021 Target Resolution Date: 06/12/2021 Goal Status: Met Ulcer/skin breakdown will have a volume reduction of 50% by week 8 Date Initiated: 05/13/2021 Target Resolution Date: 07/13/2021 Goal Status: Active Ulcer/skin breakdown will have a volume reduction of 80% by week 12 Date Initiated: 05/13/2021 Target Resolution Date: 08/13/2021 Goal Status: Active Ulcer/skin breakdown will heal within 14 weeks Date Initiated: 05/13/2021 Target Resolution Date: 09/12/2021 Goal Status: Active Interventions: Assess patient/caregiver ability to obtain necessary supplies Assess patient/caregiver ability to perform ulcer/skin care regimen upon admission and as needed Assess ulceration(s) every visit Notes: Electronic Signature(s) Signed: 07/01/2021 4:32:50 PM By: Carlene Coria RN Entered By: Carlene Coria on 06/30/2021 11:25:03 Acri, [name] ([id number]) -------------------------------------------------------------------------------- Pain Assessment Details Patient Name: [name] Date of Service: 06/30/2021 11:00 AM Medical Record Number: [id number] Patient Account Number: [id number] Date of Birth/Sex: 09-10-1923 (87 y.o. F) Treating RN: Carlene Coria Primary Care Mitsuye Schrodt: Adrian Prows Other Clinician: Referring Lewie Deman: Adrian Prows Treating Jaymian Bogart/Extender: Skipper Cliche in Treatment: 6 Active Problems Location of Pain Severity and Description of Pain Patient Has Paino No Site Locations Pain Management and Medication Current Pain Management: Electronic Signature(s) Signed: 07/01/2021 4:32:50 PM By: Carlene Coria RN Entered By: Carlene Coria on 06/30/2021 11:15:43 Radovich, [name] ([id number]) -------------------------------------------------------------------------------- Patient/Caregiver Education Details Patient Name: [name] Date of Service: 06/30/2021 11:00 AM Medical  Record Number: [id number] Patient Account Number: [id number] Date of Birth/Gender: Apr 09, 1923 (87 y.o. F) Treating RN: Carlene Coria Primary Care Physician: Adrian Prows Other Clinician: Referring Physician: Adrian Prows Treating Physician/Extender: Skipper Cliche in Treatment: 6 Education Assessment Education Provided To: Patient Education Topics Provided Wound/Skin Impairment: Methods: Explain/Verbal Responses: State content correctly Electronic Signature(s) Signed: 07/01/2021 4:32:50 PM By: Carlene Coria RN Entered By: Carlene Coria on 06/30/2021 11:28:35 Naraine, [name] ([id number]) -------------------------------------------------------------------------------- Wound Assessment Details Patient Name: [name] Date of Service: 06/30/2021 11:00 AM Medical Record Number: [id number] Patient Account Number: [id number] Date of Birth/Sex: 1923/03/02 (87 y.o. F) Treating RN: Carlene Coria Primary Care Lilianne Delair: Adrian Prows Other Clinician: Referring Ronnisha Felber: Adrian Prows Treating Dharma Pare/Extender: Skipper Cliche in Treatment: 6 Wound Status Wound Number: 7 Primary Pressure Ulcer Etiology: Wound Location: Right, Lateral Malleolus Wound Open Wounding Event: Gradually Appeared Status: Date Acquired: 05/12/2021 Comorbid Cataracts, Congestive Heart Failure, Hypertension, Weeks Of Treatment: 6 History: Peripheral Arterial Disease, Osteoarthritis, Neuropathy Clustered Wound: No Photos Wound Measurements Length: (cm) 0.5 Width: (cm) 0.6 Depth: (cm) 0.2 Area: (cm) 0.236 Volume: (cm) 0.047 % Reduction in Area: 79% % Reduction in Volume: 79.1% Epithelialization: Small (1-33%) Tunneling: No Undermining: No Wound Description Classification: Category/Stage III Wound Margin: Distinct, outline attached Exudate Amount: Medium Exudate Type: Serous Exudate Color: amber Foul Odor After Cleansing: No Slough/Fibrino Yes Wound Bed Granulation  Amount: Large (67-100%) Exposed Structure Granulation Quality: Red, Pink, Pale  Fascia Exposed: No Necrotic Amount: Small (1-33%) Fat Layer (Subcutaneous Tissue) Exposed: Yes Necrotic Quality: Adherent Slough Tendon Exposed: No Muscle Exposed: No Joint Exposed: No Bone Exposed: No Treatment Notes Wound #7 (Malleolus) Wound Laterality: Right, Lateral Cleanser Soap and Water Discharge Instruction: Gently cleanse wound with antibacterial soap, rinse and pat dry prior to dressing wounds Peri-Wound Care Homan, [name] ([id number]) Topical Primary Dressing Prisma 4.34 (in) Discharge Instruction: Moisten w/normal saline or sterile water; Cover wound as directed. Do not remove from wound bed. Secondary Dressing ABD Pad 5x9 (in/in) Discharge Instruction: Cover with ABD pad Secured With Compression Wrap Profore Lite LF 3 Multilayer Compression Bandaging System Discharge Instruction: Apply 3 multi-layer wrap as prescribed. Compression Stockings Add-Ons Electronic Signature(s) Signed: 07/01/2021 4:32:50 PM By: Carlene Coria RN Entered By: Carlene Coria on 06/30/2021 11:17:02 Sparr, [name] ([id number]) -------------------------------------------------------------------------------- Vitals Details Patient Name: [name] Date of Service: 06/30/2021 11:00 AM Medical Record Number: [id number] Patient Account Number: [id number] Date of Birth/Sex: 1923-11-22 (87 y.o. F) Treating RN: Carlene Coria Primary Care Elky Funches: Adrian Prows Other Clinician: Referring Katryna Tschirhart: Adrian Prows Treating Shellia Hartl/Extender: Skipper Cliche in Treatment: 6 Vital Signs Time Taken: 11:15 Temperature (F): 98.5 Height (in): 62 Pulse (bpm): 88 Weight (lbs): 150 Respiratory Rate (breaths/min): 18 Body Mass Index (BMI): 27.4 Blood Pressure (mmHg): 134/76 Reference Range: 80 - 120 mg / dl Electronic Signature(s) Signed: 07/01/2021 4:32:50 PM By: Carlene Coria RN Entered By: Carlene Coria on 06/30/2021 11:15:30

## 2021-07-07 ENCOUNTER — Other Ambulatory Visit: Payer: Self-pay

## 2021-07-07 ENCOUNTER — Encounter: Payer: Medicare Other | Attending: Physician Assistant | Admitting: Physician Assistant

## 2021-07-07 DIAGNOSIS — Z950 Presence of cardiac pacemaker: Secondary | ICD-10-CM | POA: Diagnosis not present

## 2021-07-07 DIAGNOSIS — L89513 Pressure ulcer of right ankle, stage 3: Secondary | ICD-10-CM | POA: Insufficient documentation

## 2021-07-07 DIAGNOSIS — Z7901 Long term (current) use of anticoagulants: Secondary | ICD-10-CM | POA: Insufficient documentation

## 2021-07-07 DIAGNOSIS — I890 Lymphedema, not elsewhere classified: Secondary | ICD-10-CM | POA: Insufficient documentation

## 2021-07-07 DIAGNOSIS — I872 Venous insufficiency (chronic) (peripheral): Secondary | ICD-10-CM | POA: Insufficient documentation

## 2021-07-07 DIAGNOSIS — I739 Peripheral vascular disease, unspecified: Secondary | ICD-10-CM | POA: Insufficient documentation

## 2021-07-07 DIAGNOSIS — Z85828 Personal history of other malignant neoplasm of skin: Secondary | ICD-10-CM | POA: Diagnosis not present

## 2021-07-07 NOTE — Progress Notes (Addendum)
[name] ([id number]) Visit Report for 07/07/2021 Arrival Information Details Patient Name: [name] Date of Service: 07/07/2021 11:00 AM Medical Record Number: [id number] Patient Account Number: [id number] Date of Birth/Sex: 02-28-1923 (87 y.o. F) Treating RN: Carlene Coria Primary Care Shantae Vantol: Adrian Prows Other Clinician: Referring Vineeth Fell: Adrian Prows Treating Jadakiss Barish/Extender: Skipper Cliche in Treatment: 7 Visit Information History Since Last Visit All ordered tests and consults were completed: No Patient Arrived: Ambulatory Added or deleted any medications: No Arrival Time: 11:30 Any new allergies or adverse reactions: No Accompanied By: daughter Had a fall or experienced change in No Transfer Assistance: None activities of daily living that may affect Patient Identification Verified: Yes risk of falls: Secondary Verification Process Completed: Yes Signs or symptoms of abuse/neglect since last visito No Patient Requires Transmission-Based No Hospitalized since last visit: No Precautions: Implantable device outside of the clinic excluding No Patient Has Alerts: Yes cellular tissue based products placed in the center Patient Alerts: Patient on Blood since last visit: Thinner Has Dressing in Place as Prescribed: Yes NOT DIABETIC Has Compression in Place as Prescribed: Yes ***ELIQUIS*** Pain Present Now: No Electronic Signature(s) Signed: 07/08/2021 11:52:50 AM By: Carlene Coria RN Entered By: Carlene Coria on 07/07/2021 11:35:25 Riede, [name] ([id number]) -------------------------------------------------------------------------------- Lower Extremity Assessment Details Patient Name: [name] Date of Service: 07/07/2021 11:00 AM Medical Record Number: [id number] Patient Account Number: [id number] Date of Birth/Sex: 1923/04/22 (87 y.o. F) Treating RN: Carlene Coria Primary Care Cecily Lawhorne: Adrian Prows Other  Clinician: Referring Yun Gutierrez: Adrian Prows Treating Ardine Iacovelli/Extender: Skipper Cliche in Treatment: 7 Edema Assessment Assessed: [Left: No] [Right: No] Edema: [Left: Ye] [Right: s] Calf Left: Right: Point of Measurement: 33 cm From Medial Instep 28 cm Ankle Left: Right: Point of Measurement: 10 cm From Medial Instep 17 cm Vascular Assessment Pulses: Dorsalis Pedis Palpable: [Right:Yes] Electronic Signature(s) Signed: 07/08/2021 11:52:50 AM By: Carlene Coria RN Entered By: Carlene Coria on 07/07/2021 11:44:04 Drinkard, [name] ([id number]) -------------------------------------------------------------------------------- Multi Wound Chart Details Patient Name: [name] Date of Service: 07/07/2021 11:00 AM Medical Record Number: [id number] Patient Account Number: [id number] Date of Birth/Sex: Jan 25, 1923 (87 y.o. F) Treating RN: Carlene Coria Primary Care Barlow Harrison: Adrian Prows Other Clinician: Referring Rikita Grabert: Adrian Prows Treating Keshaun Dubey/Extender: Skipper Cliche in Treatment: 7 Vital Signs Height(in): 5 Pulse(bpm): 36 Weight(lbs): 150 Blood Pressure(mmHg): 134/76 Body Mass Index(BMI): 27 Temperature(F): 98.5 Respiratory Rate(breaths/min): 18 Photos: [N/A:N/A] Wound Location: Right, Lateral Malleolus N/A N/A Wounding Event: Gradually Appeared N/A N/A Primary Etiology: Pressure Ulcer N/A N/A Comorbid History: Cataracts, Congestive Heart Failure, N/A N/A Hypertension, Peripheral Arterial Disease, Osteoarthritis, Neuropathy Date Acquired: 05/12/2021 N/A N/A Weeks of Treatment: 7 N/A N/A Wound Status: Open N/A N/A Measurements L x W x D (cm) 0.4x0.3x0.2 N/A N/A Area (cm) : 0.094 N/A N/A Volume (cm) : 0.019 N/A N/A % Reduction in Area: 91.60% N/A N/A % Reduction in Volume: 91.60% N/A N/A Classification: Category/Stage III N/A N/A Exudate Amount: Medium N/A N/A Exudate Type: Serosanguineous N/A N/A Exudate Color: red, brown N/A  N/A Wound Margin: Distinct, outline attached N/A N/A Granulation Amount: Large (67-100%) N/A N/A Granulation Quality: Red, Pink, Pale N/A N/A Necrotic Amount: Small (1-33%) N/A N/A Exposed Structures: Fat Layer (Subcutaneous Tissue): N/A N/A Yes Fascia: No Tendon: No Muscle: No Joint: No Bone: No Epithelialization: Small (1-33%) N/A N/A Treatment Notes Electronic Signature(s) Signed: 07/08/2021 11:52:50 AM By: Carlene Coria RN Entered By: Carlene Coria on 07/07/2021 11:54:36 Comer, [name] ([id number]) -------------------------------------------------------------------------------- Multi-Disciplinary Care Plan Details Patient Name:  [name], [name] Date of Service: 07/07/2021 11:00 AM Medical Record Number: [id number] Patient Account Number: [id number] Date of Birth/Sex: 1923/05/26 (87 y.o. F) Treating RN: Carlene Coria Primary Care Zaryia Markel: Adrian Prows Other Clinician: Referring Roye Gustafson: Adrian Prows Treating Donnelle Rubey/Extender: Skipper Cliche in Treatment: 7 Active Inactive Wound/Skin Impairment Nursing Diagnoses: Knowledge deficit related to ulceration/compromised skin integrity Goals: Patient/caregiver will verbalize understanding of skin care regimen Date Initiated: 05/13/2021 Date Inactivated: 05/13/2021 Target Resolution Date: 06/12/2021 Goal Status: Met Ulcer/skin breakdown will have a volume reduction of 30% by week 4 Date Initiated: 05/13/2021 Date Inactivated: 06/16/2021 Target Resolution Date: 06/12/2021 Goal Status: Met Ulcer/skin breakdown will have a volume reduction of 50% by week 8 Date Initiated: 05/13/2021 Target Resolution Date: 07/13/2021 Goal Status: Active Ulcer/skin breakdown will have a volume reduction of 80% by week 12 Date Initiated: 05/13/2021 Target Resolution Date: 08/13/2021 Goal Status: Active Ulcer/skin breakdown will heal within 14 weeks Date Initiated: 05/13/2021 Target Resolution Date: 09/12/2021 Goal Status:  Active Interventions: Assess patient/caregiver ability to obtain necessary supplies Assess patient/caregiver ability to perform ulcer/skin care regimen upon admission and as needed Assess ulceration(s) every visit Notes: Electronic Signature(s) Signed: 07/08/2021 11:52:50 AM By: Carlene Coria RN Entered By: Carlene Coria on 07/07/2021 11:54:26 Tobey, [name] ([id number]) -------------------------------------------------------------------------------- Pain Assessment Details Patient Name: [name] Date of Service: 07/07/2021 11:00 AM Medical Record Number: [id number] Patient Account Number: [id number] Date of Birth/Sex: 1923-08-28 (87 y.o. F) Treating RN: Carlene Coria Primary Care Adriena Manfre: Adrian Prows Other Clinician: Referring Oktober Glazer: Adrian Prows Treating Jayleen Afonso/Extender: Skipper Cliche in Treatment: 7 Active Problems Location of Pain Severity and Description of Pain Patient Has Paino No Site Locations Pain Management and Medication Current Pain Management: Electronic Signature(s) Signed: 07/08/2021 11:52:50 AM By: Carlene Coria RN Entered By: Carlene Coria on 07/07/2021 11:36:06 Bitton, [name] ([id number]) -------------------------------------------------------------------------------- Wound Assessment Details Patient Name: [name] Date of Service: 07/07/2021 11:00 AM Medical Record Number: [id number] Patient Account Number: [id number] Date of Birth/Sex: March 21, 1923 (87 y.o. F) Treating RN: Carlene Coria Primary Care Latravis Grine: Adrian Prows Other Clinician: Referring Othmar Ringer: Adrian Prows Treating Koehn Salehi/Extender: Skipper Cliche in Treatment: 7 Wound Status Wound Number: 7 Primary Pressure Ulcer Etiology: Wound Location: Right, Lateral Malleolus Wound Open Wounding Event: Gradually Appeared Status: Date Acquired: 05/12/2021 Comorbid Cataracts, Congestive Heart Failure, Hypertension, Weeks Of Treatment: 7 History:  Peripheral Arterial Disease, Osteoarthritis, Neuropathy Clustered Wound: No Photos Wound Measurements Length: (cm) 0.4 Width: (cm) 0.3 Depth: (cm) 0.2 Area: (cm) 0.094 Volume: (cm) 0.019 % Reduction in Area: 91.6% % Reduction in Volume: 91.6% Epithelialization: Small (1-33%) Tunneling: No Undermining: No Wound Description Classification: Category/Stage III Wound Margin: Distinct, outline attached Exudate Amount: Medium Exudate Type: Serosanguineous Exudate Color: red, brown Foul Odor After Cleansing: No Slough/Fibrino Yes Wound Bed Granulation Amount: Large (67-100%) Exposed Structure Granulation Quality: Red, Pink, Pale Fascia Exposed: No Necrotic Amount: Small (1-33%) Fat Layer (Subcutaneous Tissue) Exposed: Yes Necrotic Quality: Adherent Slough Tendon Exposed: No Muscle Exposed: No Joint Exposed: No Bone Exposed: No Electronic Signature(s) Signed: 07/08/2021 11:52:50 AM By: Carlene Coria RN Entered By: Carlene Coria on 07/07/2021 11:42:07 Bristow, [name] ([id number]) -------------------------------------------------------------------------------- Vitals Details Patient Name: [name] Date of Service: 07/07/2021 11:00 AM Medical Record Number: [id number] Patient Account Number: [id number] Date of Birth/Sex: 12/13/1922 (87 y.o. F) Treating RN: Carlene Coria Primary Care Eldene Plocher: Adrian Prows Other Clinician: Referring Ebany Bowermaster: Adrian Prows Treating Trellis Guirguis/Extender: Skipper Cliche in Treatment: 7 Vital Signs Time Taken: 11:35 Temperature (F): 98.5 Height (in): 62 Pulse (bpm):  88 Weight (lbs): 150 Respiratory Rate (breaths/min): 18 Body Mass Index (BMI): 27.4 Blood Pressure (mmHg): 134/76 Reference Range: 80 - 120 mg / dl Electronic Signature(s) Signed: 07/08/2021 11:52:50 AM By: Carlene Coria RN Entered By: Carlene Coria on 07/07/2021 11:35:46

## 2021-07-07 NOTE — Progress Notes (Addendum)
HINATA, HORACE ([id number]) Visit Report for 07/07/2021 Chief Complaint Document Details Patient Name: [name], [name]. Date of Service: 07/07/2021 11:00 AM Medical Record Number: [id number] Patient Account Number: [id number] Date of Birth/Sex: [birth year] (87 y.o. F) Treating RN: Carlene Coria Primary Care Provider: Adrian Prows Other Clinician: Referring Provider: Adrian Prows Treating Provider/Extender: Skipper Cliche in Treatment: 7 Information Obtained from: Patient Chief Complaint Right foot ulcer Electronic Signature(s) Signed: 07/07/2021 10:59:10 AM By: Worthy Keeler PA-C Entered By: Worthy Keeler on 07/07/2021 10:59:10 Hannis, [name] ([id number]) -------------------------------------------------------------------------------- Debridement Details Patient Name: [name] Date of Service: 07/07/2021 11:00 AM Medical Record Number: [id number] Patient Account Number: [id number] Date of Birth/Sex: 04-19-1923 (87 y.o. F) Treating RN: Carlene Coria Primary Care Provider: Adrian Prows Other Clinician: Referring Provider: Adrian Prows Treating Provider/Extender: Skipper Cliche in Treatment: 7 Debridement Performed for Wound #7 Right,Lateral Malleolus Assessment: Performed By: Physician Tommie Sams., PA-C Debridement Type: Debridement Level of Consciousness (Pre- Awake and Alert procedure): Pre-procedure Verification/Time Out Yes - 11:54 Taken: Start Time: 11:54 Pain Control: Lidocaine 4% Topical Solution Total Area Debrided (L x W): 0.4 (cm) x 0.3 (cm) = 0.12 (cm) Tissue and other material Viable, Non-Viable, Slough, Subcutaneous, Skin: Dermis , Skin: Epidermis, Slough debrided: Level: Skin/Subcutaneous Tissue Debridement Description: Excisional Instrument: Curette Bleeding: Minimum Hemostasis Achieved: Pressure End Time: 11:57 Procedural Pain: 0 Post Procedural Pain: 0 Response to Treatment: Procedure was tolerated  well Level of Consciousness (Post- Awake and Alert procedure): Post Debridement Measurements of Total Wound Length: (cm) 4 Stage: Category/Stage III Width: (cm) 0.3 Depth: (cm) 0.2 Volume: (cm) 0.188 Character of Wound/Ulcer Post Debridement: Improved Post Procedure Diagnosis Same as Pre-procedure Electronic Signature(s) Signed: 07/07/2021 9:44:25 PM By: Worthy Keeler PA-C Signed: 07/08/2021 11:52:50 AM By: Carlene Coria RN Entered By: Carlene Coria on 07/07/2021 11:57:11 Stonebraker, [name] ([id number]) -------------------------------------------------------------------------------- HPI Details Patient Name: [name] Date of Service: 07/07/2021 11:00 AM Medical Record Number: [id number] Patient Account Number: [id number] Date of Birth/Sex: [birth year] (87 y.o. 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 Dr. Amalia Hailey the podiatrist saw her on 07/20/2017 and sent her to the wound center for possible hyperbaric oxygen therapy. past medical history of peripheral vascular disease, varicose veins, status post appendectomy, basal cell carcinoma excision from the left leg, cholecystectomy, pacemaker placement, right lower extremity angiography done by Dr. dew in March 2017 with placement of a stent. there is also note of a successful ablation of the right small saphenous vein done which was reviewed by ultrasound on 10/24/2016. the patient had a right small saphenous vein ablation done on 10/20/2016. The patient has never been a smoker. She has been seen by Dr. Corene Cornea dew the vascular surgeon who most recently  saw her on 06/15/2017 for evaluation of ongoing problems with right leg swelling. She had a lower extremity arterial duplex examination done(02/13/17) which showed patent distal right superficial femoral artery stent and above-the-knee popliteal stent without evidence of restenosis. The ABI was more than 1.3 on the right and more than 1.3 on the left. This was consistent with noncompressible arteries due to medial calcification. The right great toe pressure and PPG waveforms are within normal limits and the left  great toe pressure and PPG waveforms are decreased. he recommended she continue to wear her compression stockings and continue with elevation. She is scheduled to have a noninvasive arterial study in the near future 08/16/2017 -- had a lower extremity arterial duplex examination done which showed patent distal right superficial femoral artery stent and above-the-knee popliteal stent without evidence of restenosis. The ABI was more than 1.3 on the right and more than 1.3 on the left. This was consistent with noncompressible arteries due to medial calcification. The right great toe pressure and PPG waveforms are within normal limits and the left great toe pressure and PPG waveforms are decreased. the x-ray of the right ankle has not yet been done 08/24/2017 -- had a right ankle x-ray -- IMPRESSION:1. No fracture, bone lesion or evidence of osteomyelitis. 2. Lateral soft tissue swelling with a soft tissue ulcer. she has not yet seen the vascular surgeon for review 08/31/17 on evaluation today patient's wound appears to be showing signs of improvement. She still with her appointment with vascular in order to review her results of her vascular study and then determine if any intervention would be recommended at that time. No fevers, chills, nausea, or vomiting noted at this time. She has been tolerating the dressing changes without complication. 09/28/17 on evaluation today patient's wound appears  to show signs of good improvement in regard to the granulation tissue which is surfacing. There is still a layer of slough covering the wound and the posterior portion is still significantly deeper than the anterior nonetheless there has been some good sign of things moving towards the better. She is going to go back to Dr. dew for reevaluation to ensure her blood flow is still appropriate. That will be before her next evaluation with [place] next week. No fevers, chills, nausea, or vomiting noted at this time. Patient does have some discomfort rated to be a 3-4/10 depending on activity specifically cleansing the wound makes it worse. 10/05/2017 -- the patient was seen by Dr. Lucky Cowboy last week and noninvasive studies showed a normal right ABI with brisk triphasic waveforms consistent with no arterial insufficiency including normal digital pressures. The duplex showed a patent distal right SFA stent and the proximal SFA was also normal. He was pleased with her test and thought she should have enough of perfusion for normal wound healing. He would see her back in 6 months time. 12/21/17 on evaluation today patient appears to be doing fairly well in regard to her right lateral ankle wound. Unfortunately the main issue that she is expansion at this point is that she is having some issues with what appears to be some cellulitis in the right anterior shin. She has also been noting a little bit of uncomfortable feeling especially last night and her ankle area. I'm afraid that she made the developing a little bit of an infection. With that being said I think it is in the early stages. 12/28/17 on evaluation today patient's ankle appears to be doing excellent. She's making good progress at this point the cellulitis seems to have improved after last week's evaluation. Overall she is having no significant discomfort which is excellent news. She does have an appointment with Dr. dew on March 29, 2018 for reevaluation in  regard to the stent he placed. She seems to have excellent blood flow in the right lower extremity. 01/19/12 on evaluation today patient's wound appears to be doing very well. In fact she does not appear to require debridement at this point,  there's no evidence of infection, and overall from the standpoint of the wound she seems to be doing very well. With that being said I believe that it may be time to switch to different dressing away from the [facility] Dressing she tells me she does have a lot going on her friend actually passed away yesterday and she's also having a lot of issues with her husband this obviously is weighing heavy on her as far as your thoughts and concerns today. 01/25/18 on evaluation today patient appears to be doing fairly well in regard to her right lateral malleolus. She has been tolerating the dressing changes without complication. Overall I feel like this is definitely showing signs of improvement as far as how the overall appearance of the wound is there's also evidence of epithelium start to migrate over the granulation tissue. In general I think that she is progressing nicely as far as the wound is concerned. The only concern she really has is whether or not we can switch to every other week visits in order to avoid having as many appointments as her daughters have a difficult time getting her to her appointments as well as the patient's husband to his he is not doing very well at this point. 02/22/18 on evaluation today patient's right lateral malleolus ulcer appears to be doing great. She has been tolerating the dressing changes without complication. Overall you making excellent progress at this time. Patient is having no significant discomfort. DENNETTE, HEDEEN ([id number]) 03/15/18 on evaluation today patient appears to be doing much more poorly in regard to her right lateral ankle ulcer at this point. Unfortunately since have last seen her her husband has passed  just a few days ago is obviously weighed heavily on her her daughter also had surgery well she is with her today as usual. There does not appear to be any evidence of infection she does seem to have significant contusion/deep tissue injury to the right lateral malleolus which was not noted previous when I saw her last. It's hard to tell of exactly when this injury occurred although during the time she was spending the night in the hospital this may have been most likely. 03/22/18 on evaluation today patient appears to actually be doing very well in regard to her ulcer. She did unfortunately have a setback which was noted last week however the good news is we seem to be getting back on track and in fact the wound in the core did still have some necrotic tissue which will be addressed at this point today but in general I'm seeing signs that things are on the up and up. She is glad to hear this obviously she's been somewhat concerned that due to the how her wound digressed more recently. 03/29/18 on evaluation today patient appears to be doing fairly well in regard to her right lower extremity lateral malleolus ulcer. She unfortunately does have a new area of pressure injury over the inferior portion where the wound has opened up a little bit larger secondary to the pressure she seems to be getting. She does tell me sometimes when she sleeps at night that it actually hurts and does seem to be pushing on the area little bit more unfortunately. There does not appear to be any evidence of infection which is good news. She has been tolerating the dressing changes without complication. She also did have some bruising in the left second and third toes due to the fact that she may have  bump this or injured it although she has neuropathy so she does not feel she did move recently that may have been where this came from. Nonetheless there does not appear to be any evidence of infection at this time. 04/12/18 on  evaluation today patient's wound on the right lateral ankle actually appears to be doing a little bit better with a lot of necrotic docking tissue centrally loosening up in clearing away. However she does have the beginnings of a deep tissue injury on the left lateral malleolus likely due to the fact we've been trying offload the right as much as we have. I think she may benefit from an assistive soft device to help with offloading and it looks like they're looking at one of the doughnut conditions that wraps around the lower leg to offload which I think will definitely do a good job. With that being said I think we definitely need to address this issue on the left before it becomes a wound. Patient is not having significant pain. 04/19/18 on evaluation today patient appears to be doing excellent in regard to the progress she's made with her right lateral ankle ulcer. The left ankle region which did show evidence of a deep tissue injury seems to be resolving there's little fluid noted underneath and a blister there's nothing open at this point in time overall I feel like this is progressing nicely which is good news. She does not seem to be having significant discomfort at this point which is also good news. 04/25/18-She is here in follow up evaluation for bilateral lateral malleolar ulcers. The right lateral malleolus ulcer with pale subcutaneous tissue exposure, central area of ulcer with tendon/periosteum exposed. The left lateral malleolus ulcer now with central area of nonviable tissue, otherwise deep tissue injury. She is wearing compression wraps to the left lower extremity, she will place the right lower extremity compression wraps on when she gets home. She will be out of town over the weekend and return next week and follow-up appointment. She completed her doxycycline this morning 05/03/18 on evaluation today patient appears to be doing very well in regard to her right lateral ankle ulcer in  general. At least she's showing some signs of improvement in this regard. Unfortunately she has some additional injury to the left lateral malleolus region which appears to be new likely even over the past several days. Again this determination is based on the overall appearance. With that being said the patient is obviously frustrated about this currently. 05/10/18-She is here in follow-up evaluation for bilateral lateral malleolar ulcers. She states she has purchased offloading shoes/boots and they will arrive tomorrow. She was asked to bring them in the office at next week's appointment so her provider is aware of product being utilized. She continues to sleep on right or left side, she has been encouraged to sleep on her back. The right lateral malleolus ulcer is precariously close to peri-osteum; will order xray. The left lateral malleolus ulcer is improved. Will switch back to santyl; she will follow up next week. 05/17/18 on evaluation today patient actually appears to be doing very well in regard to her malleolus her ulcers compared to last time I saw them. She does not seem to have as much in the way of contusion at this point which is great news. With that being said she does continue to have discomfort and I do believe that she is still continuing to benefit from the offloading/pressure reducing boots that were recommended. I  think this is the key to trying to get this to heal up completely. 05/24/18 on evaluation today patient actually appears to be doing worse at this point in time unfortunately compared to her last week's evaluation. She is having really no increased pain which is good news unfortunately she does have more maceration in your theme and noted surrounding the right lateral ankle the left lateral ankle is not really is erythematous I do not see signs of the overt cellulitis on that side. Unfortunately the wounds do not seem to have shown any signs of improvement since the last  evaluation. She also has significant swelling especially on the right compared to previous some of this may be due to infection however also think that she may be served better while she has these wounds by compression wrapping versus continuing to use the Juxta-Lite for the time being. Especially with the amount of drainage that she is experiencing at this point. No fevers, chills, nausea, or vomiting noted at this time. 05/31/18 on evaluation today patient appears to actually be doing better in regard to her right lateral lower extremity ulcer specifically on the malleolus region. She has been tolerating the antibiotic without complication. With that being said she still continues to have issues but a little bit of redness although nothing like she what she was experiencing previous. She still continues to pressure to her ankle area she did get the problem on offloading boots unfortunately she will not wear them she states there too uncomfortable and she can't get in and out of the bed. Nonetheless at this point her wounds seem to be continually getting worse which is not what we want I'm getting somewhat concerned about her progress and how things are going to proceed if we do not intervene in some way shape or form. I therefore had a very lengthy conversation today about offloading yet again and even made a specific suggestion for switching her to a memory foam mattress and even gave the information for a specific one that they could look at getting if it was something that they were interested in considering. She does not want to be considered for a hospital bed air mattress although honestly insurance would not cover it that she does not have any wounds on her trunk. 06/14/18 on evaluation today both wounds over the bilateral lateral malleolus her ulcers appear to be doing better there's no evidence of pressure injury at this point. She did get the foam mattress for her bed and this does seem to  have been extremely beneficial for her in my pinion. Her daughter states that she is having difficulty getting out of bed because of how soft it is. The patient also relates this to be. Nonetheless I do feel like she's actually doing better. Unfortunately right after and around the time she was getting the mattress she also sustained a fall when she got up to go pick up the phone and ended up injuring her right elbow she has 18 sutures in place. We are not caring for this currently although home health is going to be taking the sutures out shortly. Nonetheless this may be something that we need to evaluate going forward. It depends on how well it has or has not healed in the end. She also recently saw an orthopedic specialist for an injection in the right shoulder just before her fall unfortunately the fall seems to have worsened her pain. 06/21/18 on evaluation today patient appears to be doing about  the same in regard to her lateral malleolus ulcers. Both appear to be just a little bit deeper but again we are clinging away the necrotic and dead tissue which I think is why this is progressing towards a deeper realm as opposed CEIRA, WILLOCKS. ([id number]) to improving from my measurement standpoint in that regard. Nonetheless she has been tolerating the dressing changes she absolutely hates the memory foam mattress topper that was obtained for her nonetheless I do believe this is still doing excellent as far as taking care of excess pressure in regard to the lateral malleolus regions. She in fact has no pressure injury that I see whereas in weeks past it was week by week I was constantly seeing new pressure injuries. Overall I think it has been very beneficial for her. 07/03/18; patient arrives in my clinic today. She has deep punched out areas over her bilateral lateral malleoli. The area on the right has some more depth. We spent a lot of time today talking about pressure relief for these areas.  This started when her daughter asked for a prescription for a memory foam mattress. I have never written a prescription for a mattress and I don't think insurances would pay for that on an ordinary bed. In any case he came up that she has foam boots that she refuses to wear. I would suggest going to these before any other offloading issues when she is in bed. They say she is meticulous about offloading this the rest of the day 07/10/18- She is seen in follow-up evaluation for bilateral, lateral malleolus ulcers. There is no improvement in the ulcers. She has purchased and is sleeping on a memory foam mattress/overlay, she has been using the offloading boots nightly over the past week. She has a follow up appointment with vascular medicine at the end of October, in my opinion this follow up should be expedited given her deterioration and suboptimal TBI results. We will order plain film xray of the left ankle as deeper structures are palpable; would consider having MRI, regardless of xray report(s). The ulcers will be treated with iodoflex/iodosorb, she is unable to safely change the dressings daily with santyl. 07/19/18 on evaluation today patient appears to be doing in general visually well in regard to her bilateral lateral malleolus ulcers. She has been tolerating the dressing changes without complication which is good news. With that being said we did have an x-ray performed on 07/12/18 which revealed a slight loosen see in the lateral portion of the distal left fibula which may represent artifact but underline lytic destruction or osteomyelitis could not be excluded. MRI was recommended. With that being said we can see about getting the patient scheduled for an MRI to further evaluate this area. In fact we have that scheduled currently for August 20 19,019. 07/26/18 on evaluation today patient's wound on the right lateral ankle actually appears to be doing fairly well at this point in my pinion. She  has made some good progress currently. With that being said unfortunately in regard to the left lateral ankle ulcer this seems to be a little bit more problematic at this time. In fact as I further evaluated the situation she actually had bone exposed which is the first time that's been the case in the bone appear to be necrotic. Currently I did review patient's note from Dr. Bunnie Domino office with  [facility] surgery. He stated that ABI was 1.26 on the right and 0.95 on the left with good  waveforms. Her perfusion is stable not reduced from previous studies and her digital waveforms were pretty good particularly on the right. His conclusion upon review of the note was that there was not much she could do to improve her perfusion and he felt she was adequate for wound healing. His suggestion was that she continued to see [place] and consider a synthetic skin graft if there was no underlying infection. He plans to see her back in six months or as needed. 08/01/18 on evaluation today patient appears to be doing better in regard to her right lateral ankle ulcer. Her left lateral ankle ulcer is about the same she still has bone involvement in evidence of necrosis. There does not appear to be evidence of infection at this time On the right lateral lower extremity. I have started her on the Augmentin she picked this up and started this yesterday. This is to get her through until she sees infectious disease which is scheduled for 08/12/18. 08/06/18 on evaluation today patient appears to be doing rather well considering my discussion with patient's daughter at the end of last week. The area which was marked where she had erythema seems to be improved and this is good news. With that being said overall the patient seems to be making good improvement when it comes to the overall appearance of the right lateral ankle ulcer although this has been slow she at least is coming around in this regard. Unfortunately in  regard to the left lateral ankle ulcer this is osteomyelitis based on the pathology report as well is bone culture. Nonetheless we are still waiting CT scan. Unfortunately the MRI we originally ordered cannot be performed as the patient is a pacemaker which I had overlooked. Nonetheless we are working on the CT scan approval and scheduling as of now. She did go to the hospital over the weekend and was placed on IV Cefzo for a couple of days. Fortunately this seems to have improved the erythema quite significantly which is good news. There does not appear to be any evidence of worsening infection at this time. She did have some bleeding after the last debridement therefore I did not perform any sharp debridement in regard to left lateral ankle at this point. Patient has been approved for a snap vac for the right lateral ankle. 08/14/18; the patient with wounds over her bilateral lateral malleoli. The area on the right actually looks quite good. Been using a snap back on this area. Healthy granulation and appears to be filling in. Unfortunately the area on the left is really problematic. 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 Also noted to have cellulitis. She saw Dr. Novella Olive of infectious disease today and was put on linezolid. We are able to verify this with her pharmacy. She is completed the Augmentin that she was already on. We've been using Iodoflex to this area 08/23/18 on evaluation today patient's wounds both actually appear to be doing better compared to my prior evaluations. Fortunately she showing signs of good improvement in regard to the overall wound status especially where were using the snap vac on the right. In regard to left lateral malleolus the wound bed actually appears to be much cleaner than previously noted. I do not feel any phone directly probed during evaluation today and though there is tendon  noted this does not appear to be necrotic it's actually fairly good as far as  the overall appearance of the tendon is concerned. In general the wound bed actually appears to be doing significantly better than it was previous. Patient is currently in the care of Dr. Linus [name] and I did review that note today. He actually has her on two weeks of linezolid and then following the patient will be on 1-2 months of Keflex. That is the plan currently. She has been on antibiotics therapy as prescribed by myself initially starting on July 30, 2018 and has been on that continuously up to this point. 08/30/18 on evaluation today patient actually appears to be doing much better in regard to her right lateral malleolus ulcer. She has been tolerating the dressing changes specifically the snap vac without complication although she did have some issues with the seal currently. Apparently there was some trouble with getting it to maintain over the past week past Sunday. Nonetheless overall the wound appears better in regard to the right lateral malleolus region. In regard to left lateral malleolus this actually show some signs of additional granulation although there still tendon noted in the base of the wound this appears to be healthy not necrotic in any way whatsoever. We are considering potentially using a snap vac for the left lateral malleolus as well the product wrap from [employer], [place], was present in the clinic today we're going to see this patient I did have her come in with me after obtaining consent from the patient and her daughter in order to look at the wound and see if there's any recommendation one way or another as to whether or not they felt the snapback could be beneficial for the left lateral malleolus region. But the conclusion was that it might be but that this is definitely a little bit deeper wound than what traditionally would be utilized for a snap vac. 09/06/18 on evaluation today patient actually  appears to be doing excellent in my pinion in regard to both ankle ulcers. She has been tolerating the dressing changes without complication which is great news. Specifically we have been using the snap vac. In regard to the right ankle I'm not even sure that this is going to be necessary for today and following as the wound has filled in quite nicely. In regard to the left ankle I do believe JENNALEE, AMON ([id number]) that we're seeing excellent epithelialization from the edge as well as granulation in the central portion the tendon is still exposed but there's no evidence of necrotic bone and in general I feel like the patient has made excellent progress even compared to last week with just one week of the snap vac. 09/11/18; this is a patient who has wounds on her bilateral lateral malleoli. Initially both of these were deep stage IV wounds in the setting of chronic arterial insufficiency. She has been revascularized. As I understand think she been using snap vacs to both of these wounds however the area on the right became more superficial and currently she is only using it on the left. Using silver collagen on the right and silver collagen under the back on the left I believe 09/19/18 on evaluation today patient actually appears to be doing very well in regard to her lateral malleolus or ulcers bilaterally. She has been tolerating the dressing changes without complication. Fortunately there does not appear to be any evidence of infection at this time. Overall I feel like she is improving in an excellent manner and I'm very pleased with the fact that everything seems to  be turning towards the better for her. This has obviously been a long road. 09/27/18 on evaluation today patient actually appears to be doing very well in regard to her bilateral lateral malleolus ulcers. She has been tolerating the dressing changes without complication. Fortunately there does not appear to be any evidence of  infection at this time which is also great news. No fevers, chills, nausea, or vomiting noted at this time. Overall I feel like she is doing excellent with the snap vac on the left malleolus. She had 40 mL of fluid collection over the past week. 10/04/18 on evaluation today patient actually appears to be doing well in regard to her bilateral lateral malleolus ulcers. She continues to tolerate the dressing changes without complication. One issue that I see is the snap vac on the left lateral malleolus which appears to have sealed off some fluid underlying this area and has not really allowed it to heal to the degree that I would like to see. For that reason I did suggest at this point we may want to pack a small piece of packing strip into this region to allow it to more effectively wick out fluid. 10/11/18 in general the patient today does not feel that she has been doing very well. She's been a little bit lethargic and subsequently is having bodyaches as well according to what she tells me today. With that being said overall she has been concerned with the fact that something may be worsening although to be honest her wounds really have not been appearing poorly. She does have a new ulcer on her left heel unfortunately. This may be pressure related. Nonetheless it seems to me to have potentially started at least as a blister I do not see any evidence of deep tissue injury. In regard to the left ankle the snap vac still seems to be causing the ceiling off of the deeper part of the wound which is in turn trapping fluid. I'm not extremely pleased with the overall appearance as far as progress from last week to this week therefore I'm gonna discontinue the snap vac at this point. 10/18/18 patient unfortunately this point has not been feeling well for the past several days. She was seen by Grayland Ormond her primary care provider who is a Librarian, academic at [facility]. Subsequently she states that  she's been very weak and generally feeling malaise. No fevers, chills, nausea, or vomiting noted at this time. With that being said bloodwork was performed at the PCP office on the 11th of this month which showed a white blood cell count of 10.7. This was repeated today and shows a white blood cell count of 12.4. This does show signs of worsening. Coupled with the fact that she is feeling worse and that her left ankle wound is not really showing signs of improvement I feel like this is an indication that the osteomyelitis is likely exacerbating not improving. Overall I think we may also want to check her C-reactive protein and sedimentation rate. Actually did call [facility] office this afternoon while the patient was in the office here with me. Subsequently based on the findings we discussed treatment possibilities and I think that it is appropriate for [place] to go ahead and initiate treatment with doxycycline which I'm going to do. Subsequently he did agree to see about adding a CRP and sedimentation rate to her orders. If that has not already been drawn to where they can run it they will contact the  patient she can come back to have that check. They are in agreement with plan as far as the patient and her daughter are concerned. Nonetheless also think we need to get in touch with Dr. Henreitta Leber office to see about getting the patient scheduled with him as soon as possible. 11/08/18 on evaluation today patient presents for follow-up concerning her bilateral foot and ankle ulcers. I did do an extensive review of her chart in epic today. Subsequently she was seen by Dr. Linus [name] he did initiate Cefepime IV antibiotic therapy. Subsequently she had some issues with her PICC line this had to be removed because it was coiled and then replaced. Fortunately that was now settled. Unfortunately she has continued have issues with her left heel as well as the issues that she is experiencing with her bilateral  lateral malleolus regions. I do believe however both areas seem to be doing a little bit better on evaluation today which is good news. No fevers, chills, nausea, or vomiting noted at this time. She actually has an angiogram schedule with Dr. dew on this coming Monday, November 11, 2018. Subsequently the patient states that she is feeling much better especially than what she was roughly 2 weeks ago. She actually had to cancel an appointment because she was feeling so poorly. No fevers, chills, nausea, or vomiting noted at this time. 11/15/18 on evaluation today patient actually is status post having had her angiogram with Dr. dew Monday, four days ago. 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 He had to go and work on several areas of the vasculature fortunately he was able to obtain no more than a 30% residual stenosis throughout post procedure. I reviewed this note today. I think this will definitely help with healing at this time. Fortunately there does not appear to be any signs of infection and I do feel like ratio already has a better appearance to it. 11/22/18 upon evaluation today patient actually appears to be doing very well in regard to her wounds in general. The right lateral malleolus looks excellent the heel looks better in the left lateral malleolus also appears to be doing a little better. With that being said the right second toe actually appears to be open and training we been watching this is been dry and stable but now is open. 12/03/2018 Seen today for follow-up and management of multiple bilateral lower extremity wounds. New pressure injury of the great toe which is closed at this time. Wound of the right distal second toe appears larger today with deep undermining and a pocket of fluid present within the undermining region. Left and right malleolus is wounds are stable today with no signs and symptoms of infection.Denies any needs or concerns during  exam today. 12/13/18 on evaluation today patient appears to be doing somewhat better in regard to her left heel ulcer. She also seems to be completely healed in regard to the right lateral malleolus ulcer. The left malleolus ulcer is smaller what unfortunately the wounds which are new over the first and second toes of the right foot are what are most concerning at this point especially the second. Both areas did require sharp debridement today. 12/20/18 on evaluation today patient's wound actually appears to be doing better in regard to left lateral ankle and her right lateral ankle continues to remain healed. The hill ulcer on the left is improved. She does have improvement noted as well in regard to both toe ulcers.  Overall I'm very pleased in this regard. No fevers, chills, nausea, or vomiting noted at this time. 12/23/18 on evaluation today patient is seen after she had her toenails trimmed at the podiatrist office due to issues with her right great toe. There was what appeared to be dark eschar on the surface of the wound which had her in the podiatrist concerned. Nonetheless as I remember that during the last office visit I had utilize silver nitrate of this area I was much less concerned about the situation. Subsequently I was able to clean off much of this tissue without any complication today. This does not appear to show any signs of infection and actually look somewhat better [name], [name] ([id number]) compared to last time post debridement. Her second toe on the right foot actually had callous over and there did appear still be some fluid underneath this that would require debridement today. 12/27/18 on evaluation today patient actually appears to be showing signs of improvement at all locations. Even the left lateral ankle although this is not quite as great as the other sites. Fortunately there does not appear to be any signs of infection at this time and both of her toes on the  right foot seem to be showing signs of improvement which is good news and very pleased in this regard. 01/03/19 on evaluation today patient appears to be doing better for the most part in regard to her wounds in particular. There does not appear to be any evidence of infection at this time which is good news. Fortunately there is no sign of really worsening anywhere except for the right great toe which she does have what appears to be a bruise/deep tissue injury which is very superficial and already resolving. I'm not sure where this came from I questioned her extensively and she does not recall what may have happened with this. Other than that the patient seems to be doing well even the left lateral ankle ulcer looks good and is getting smaller. 01/10/19 on evaluation today patient appears to be doing well in regard to her left heel wound and both of her toe wounds. Overall I feel like there is definitely improvement here and I'm happy in that regard. With that being said unfortunately she is having issues with the left lateral malleolus ulcer which unfortunately still has a lot of depth to it. This is gonna be a very difficult wound for [place] to be able to truly get to heal. I may want to consider some type of skin substitute to see if this would be of benefit for her. I'll discuss this with her more the next visit most likely. This was something I thought about more at the end of the visit when I was Artie out of the room and the patient had been discharged. 01/17/19 on evaluation today patient appears to be doing very well in regard to her wounds in general. She's been making excellent progress at this time. Fortunately there's no sign of infection at this time either. No fevers, chills, nausea, or vomiting noted at this time. The biggest issue is still her left lateral malleolus where it appears to be doing well and is getting smaller but still shows a small corner where this is deeper and goes down  into what appears to be the joint space. Nonetheless this is taking much longer to heal although it still looks better in smaller than previous evaluations. 01/24/19 on evaluation today patient's wounds actually appear to be doing  rather well in general overall. She did require some sharp debridement in regard to the right great toe but everything else appears to be doing excellent no debridement was even necessary. No fevers, chills, nausea, or vomiting noted at this time. 01/31/19 on evaluation today patient actually appears to be doing much better in regard to her left foot wound on the heel as well as the ankle. The right great toe appears to be a little bit worse today this had callous over and trapped a lot of fluid underneath. Fortunately there's no signs of infection at any site which is great news. 02/07/19 on evaluation today patient actually appears to be doing decently well in regard to all of her ulcers at this point. No sharp debridement was required she is a little bit of hyper granulation in regard to the left lateral ankle as well as the left heel but the hill itself is almost completely healed which is excellent news. Overall been very pleased in this regard. 02/14/19 on evaluation today patient actually appears to be doing very well in regard to her ulcers on the right first toe, left lateral malleolus, and left heel. In fact the heel is almost completely healed at this point. The patient does not show any signs of infection which is good news. Overall very pleased with how things have progressed. 04/18/19 Telehealth Evaluation During the COVID-19 National Emergency: Verbal Consent: Obtained from patient Allergies: reviewed and the active list is current. Medication changes: patient has no current medication changes. COVID-19 Screening: 1. Have you traveled internationally or on a cruise ship in the last 14 dayso No 2. Have you had contact with someone with or under investigation for  COVID-19o No 3. Have you had a fever, cough, sore throat, or experiencing shortness of breatho No on evaluation today actually did have a visit with this patient through a telehealth encounter with her home health nurse. Subsequently it was noted that the patient actually appears to be doing okay in regard to her wounds both the right great toe as well as the left lateral malleolus have shown signs of improvement although this in your theme around the left lateral malleolus there eschar coverings for both locations. The question is whether or not they are actually close and whether or not home health needs to discharge the patient or not. Nonetheless my concern is this point obviously is that without actually seeing her and being able to evaluate this directly I cannot ensure that she is completely healed which is the question that I'm being asked. 04/22/19 on evaluation today patient presents for her first evaluation since last time I saw her which was actually February 14, 2019. I did do a telehealth visit last week in which point it was questionable whether or not she may be healed and had to bring her in today for confirmation. With that being said she does seem to be doing quite well at this point which is good news. There does not appear to be any drainage in the deed I believe her wounds may be healed. Readmission: 09/04/2019 on evaluation today patient appears to be doing unfortunately somewhat more poorly in regard to her left foot ulcer secondary to a wound that began on 08/21/2019 at least when she first noticed this. Fortunately she has not had any evidence of active infection at this time. Systemically. I also do not necessarily see any evidence of infection at the blister/wound site on the first metatarsal head plantar aspect. This almost  appears to be something that may have just rubbed inappropriately causing this to breakdown. They did not want a wait too long to come in to be seen as  again she had significant issues in the past with wounds that took quite a while to heal in fact it was close to 2 years. Nonetheless this does not appear to be quite that bad but again we do need to remove some of the necrotic tissue from the surface of the wound to tell exactly the extent. She does not appear to have any significant arterial disease at this point and again her last ABIs and TBI's are recorded above in the alert section her left ABI was 1.27 with a TBI of 0.72 to the right ABI 1.08 with a TBI of 0.39. Other than this the patient has been doing quite well since I last saw her and that was in May 2020. 09/11/2019 on evaluation today patient appeared to be doing very well with regard to her plantar foot ulcer on the left. In fact this appears to be almost completely healed which is awesome. That is after just 1 week of intervention. With that being said there is no signs of active infection at this time. 09/18/2019 on evaluation today patient actually appears to be doing excellent in fact she is completely healed based on what I am seeing at this ANN, CRANSON. ([id number]) point. Fortunately there is no signs of active infection at this time and overall patient is very pleased to hear that this area has healed so quickly. Readmission: 05/13/2021 upon evaluation today this patient presents for reevaluation here in the clinic. This is a wound that actually we previously took care of. She had 1 on the right ankle and the left the left turned out to be be harder due to to heal but nonetheless is doing great at this point as the right that has reopened and it was noted first just several weeks ago with a scab over it and came off in just the past few days. Fortunately there does not appear to be any obvious evidence of significant active infection at this time which is great news. No fevers, chills, nausea, vomiting, or diarrhea. The patient does have a history of pacemaker along with  being on Eliquis currently as well. There does not appear to be any signs of this interfering in any way with her wound. She does have swelling we previously had compression socks for her ordered but again it does not look like she wears these on a regular basis by any means. 05/26/2021 upon evaluation today patient appears to be doing well with regard to her wound which is actually showing signs of excellent improvement. There does not appear to be any signs of active infection which is great news and overall very pleased with where things stand today. No fevers, chills, nausea, vomiting, or diarrhea. 06/02/2021 upon evaluation today patient's wound actually showing signs of excellent improvement. Fortunately there does not appear to be any signs of active infection which is great news. I think the patient is making good progress with regard to her wounds in general. 06/09/2021 upon evaluation today patient appears to be doing excellent in regard to her wounds currently. Fortunately there does not appear to be any signs of active infection which is great news. No fevers, chills, nausea, vomiting, or diarrhea. Overall extremely pleased with where things stand today. I think the patient is making excellent progress. 06/16/2021 upon evaluation today patient appears  to be doing well in regard to her wound. This is going require little bit of debridement today and that was discussed with the patient. Otherwise she seems to be doing quite well and I am actually very pleased with where things stand at this point. No fevers, chills, nausea, vomiting, or diarrhea. 06/23/2021 upon evaluation today patient appears to be doing well with regard to her wounds. She has been tolerating the dressing changes without complication. Fortunately there does not appear to be any evidence of infection and she has not had air in her home which she actually lives at an assisted living that got fixed this morning. With that being  said because of that her wrap has been extremely hot and bothersome for her over the past week. 06/30/2021 upon evaluation today patient is actually making excellent progress in regard to her ankle ulcer. She has been tolerating the dressing changes without complication and overall extremely pleased with where things stand there does not appear to be any evidence of active infection which is great news. No fevers, chills, nausea, vomiting, or diarrhea. 07/07/21 upon evaluation today patients and culture on the right actually appears to be doing quite well. There does not appear to be any signs of infection and overall very pleased with where things stand today. No fevers, chills, nausea, or vomiting noted at this time. Electronic Signature(s) Signed: 07/07/2021 9:44:25 PM By: Worthy Keeler PA-C Entered By: Worthy Keeler on 07/07/2021 20:31:06 [name] ([id number]) -------------------------------------------------------------------------------- Physical Exam Details Patient Name: TAESHA, [name] Date of Service: 07/07/2021 11:00 AM Medical Record Number: [id number] Patient Account Number: [id number] Date of Birth/Sex: [birth year] (87 y.o. F) Treating RN: Carlene Coria Primary Care Provider: Adrian Prows Other Clinician: Referring Provider: Adrian Prows Treating Provider/Extender: Skipper Cliche in Treatment: 7 Constitutional Well-nourished and well-hydrated in no acute distress. Respiratory normal breathing without difficulty. Psychiatric this patient is able to make decisions and demonstrates good insight into disease process. Alert and Oriented x 3. pleasant and cooperative. Notes Upon inspection patients wound bed actually showed signs of good granulation and a physician at this point. There does not appear to be any evidence of active infection which is great news and a wrong extremely please with where things stand today. Post agreement wound bed appears to be  doing even better. Electronic Signature(s) Signed: 07/07/2021 9:44:25 PM By: Worthy Keeler PA-C Entered By: Worthy Keeler on 07/07/2021 20:31:28 [name] ([id number]) -------------------------------------------------------------------------------- Physician Orders Details Patient Name: [name] Date of Service: 07/07/2021 11:00 AM Medical Record Number: [id number] Patient Account Number: [id number] Date of Birth/Sex: May 12, 1923 (87 y.o. F) Treating RN: Carlene Coria Primary Care Provider: Adrian Prows Other Clinician: Referring Provider: Adrian Prows Treating Provider/Extender: Skipper Cliche in Treatment: 7 Verbal / Phone Orders: No Diagnosis Coding ICD-10 Coding Code Description 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, stage 3 I89.0 Lymphedema, not elsewhere classified I87.2 Venous insufficiency (chronic) (peripheral) Z95.0 Presence of cardiac pacemaker Z79.01 Long term (current) use of anticoagulants Follow-up Appointments o Return Appointment in 1 week. o Nurse Visit as needed Bathing/ Shower/ Hygiene o May shower with wound dressing protected with water repellent cover or cast protector. Edema Control - Lymphedema / Segmental Compressive Device / Other o Optional: One layer of unna paste to top of compression wrap (to act as an anchor). o Elevate, Exercise Daily and Avoid Standing for Long Periods of Time. o Elevate legs to the level of the heart and pump ankles as often as  possible o Elevate leg(s) parallel to the floor when sitting. Wound Treatment Wound #7 - Malleolus Wound Laterality: Right, Lateral Cleanser: Soap and Water 1 x Per Week/30 Days Discharge Instructions: Gently cleanse wound with antibacterial soap, rinse and pat dry prior to dressing wounds Primary Dressing: Prisma 4.34 (in) 1 x Per Week/30 Days Discharge Instructions: Moisten w/normal saline or sterile water; Cover wound as directed. Do not remove from wound  bed. Secondary Dressing: ABD Pad 5x9 (in/in) 1 x Per Week/30 Days Discharge Instructions: Cover with ABD pad Compression Wrap: Profore Lite LF 3 Multilayer Compression Bandaging System 1 x Per Week/30 Days Discharge Instructions: Apply 3 multi-layer wrap as prescribed. Electronic Signature(s) Signed: 07/07/2021 9:44:25 PM By: Worthy Keeler PA-C Signed: 07/08/2021 11:52:50 AM By: Carlene Coria RN Entered By: Carlene Coria on 07/07/2021 11:55:13 [place], [name] ([id number]) -------------------------------------------------------------------------------- Problem List Details Patient Name: [name] Date of Service: 07/07/2021 11:00 AM Medical Record Number: [id number] Patient Account Number: [id number] Date of Birth/Sex: [birth year] (87 y.o. F) Treating RN: Carlene Coria Primary Care Provider: Adrian Prows Other Clinician: Referring Provider: Adrian Prows Treating Provider/Extender: Skipper Cliche in Treatment: 7 Active Problems ICD-10 Encounter Code Description Active Date MDM Diagnosis 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, stage 3 05/13/2021 No Yes I89.0 Lymphedema, not elsewhere classified 05/13/2021 No Yes I87.2 Venous insufficiency (chronic) (peripheral) 05/13/2021 No Yes Z95.0 Presence of cardiac pacemaker 05/13/2021 No Yes Z79.01 Long term (current) use of anticoagulants 05/13/2021 No Yes Inactive Problems Resolved Problems Electronic Signature(s) Signed: 07/07/2021 10:59:02 AM By: Worthy Keeler PA-C Entered By: Worthy Keeler on 07/07/2021 10:59:02 [place], [name] ([id number]) -------------------------------------------------------------------------------- Progress Note Details Patient Name: [name] Date of Service: 07/07/2021 11:00 AM Medical Record Number: [id number] Patient Account Number: [id number] Date of Birth/Sex: 03-29-1923 (87 y.o. 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 Dr. Amalia Hailey the podiatrist saw her on 07/20/2017 and sent her to the wound center for possible hyperbaric oxygen therapy. past medical history of peripheral vascular disease, varicose veins, status post appendectomy, basal cell carcinoma excision from the left leg, cholecystectomy, pacemaker placement, right lower extremity angiography done by Dr. dew in March 2017 with placement of a stent. there is also note of a successful ablation of the right small saphenous vein done which was reviewed by ultrasound on 10/24/2016. the patient had a right small saphenous vein ablation done on 10/20/2016. The patient has never been a smoker. She has been seen by Dr. Corene Cornea dew the vascular surgeon who most recently saw her on 06/15/2017 for evaluation of ongoing problems with right leg swelling. She had a lower extremity arterial duplex examination done(02/13/17) which showed patent distal right superficial femoral artery stent and above-the-knee popliteal stent without evidence of restenosis. The ABI was more than 1.3 on the right and more than 1.3 on the left. This was consistent with noncompressible arteries due to medial calcification. The right great toe pressure and PPG waveforms are within normal limits and the left great toe pressure and PPG waveforms are decreased. he recommended she continue to wear her compression stockings and continue with elevation. She is scheduled to have a noninvasive arterial  study in the near future 08/16/2017 -- had a lower extremity arterial duplex examination done which showed  patent distal right superficial femoral artery stent and above-the-knee popliteal stent without evidence of restenosis. The ABI was more than 1.3 on the right and more than 1.3 on the left. This was consistent with noncompressible arteries due to medial calcification. The right great toe pressure and PPG waveforms are within normal limits and the left great toe pressure and PPG waveforms are decreased. the x-ray of the right ankle has not yet been done 08/24/2017 -- had a right ankle x-ray -- IMPRESSION:1. No fracture, bone lesion or evidence of osteomyelitis. 2. Lateral soft tissue swelling with a soft tissue ulcer. she has not yet seen the vascular surgeon for review 08/31/17 on evaluation today patient's wound appears to be showing signs of improvement. She still with her appointment with vascular in order to review her results of her vascular study and then determine if any intervention would be recommended at that time. No fevers, chills, nausea, or vomiting noted at this time. She has been tolerating the dressing changes without complication. 09/28/17 on evaluation today patient's wound appears to show signs of good improvement in regard to the granulation tissue which is surfacing. There is still a layer of slough covering the wound and the posterior portion is still significantly deeper than the anterior nonetheless there has been some good sign of things moving towards the better. She is going to go back to Dr. dew for reevaluation to ensure her blood flow is still appropriate. That will be before her next evaluation with [place] next week. No fevers, chills, nausea, or vomiting noted at this time. Patient does have some discomfort rated to be a 3-4/10 depending on activity specifically cleansing the wound makes it worse. 10/05/2017 -- the patient was seen by Dr. Lucky Cowboy last week and noninvasive studies showed a normal right ABI with brisk triphasic waveforms consistent with no arterial  insufficiency including normal digital pressures. The duplex showed a patent distal right SFA stent and the proximal SFA was also normal. He was pleased with her test and thought she should have enough of perfusion for normal wound healing. He would see her back in 6 months time. 12/21/17 on evaluation today patient appears to be doing fairly well in regard to her right lateral ankle wound. Unfortunately the main issue that she is expansion at this point is that she is having some issues with what appears to be some cellulitis in the right anterior shin. She has also been noting a little bit of uncomfortable feeling especially last night and her ankle area. I'm afraid that she made the developing a little bit of an infection. With that being said I think it is in the early stages. 12/28/17 on evaluation today patient's ankle appears to be doing excellent. She's making good progress at this point the cellulitis seems to have improved after last week's evaluation. Overall she is having no significant discomfort which is excellent news. She does have an appointment with Dr. dew on March 29, 2018 for reevaluation in regard to the stent he placed. She seems to have excellent blood flow in the right lower extremity. 01/19/12 on evaluation today patient's wound appears to be doing very well. In fact she does not appear to require debridement at this point, there's no evidence of infection, and overall from the standpoint of the wound she seems to be doing very well. With that being said I believe that it may  be time to switch to different dressing away from the [facility] she tells me she does have a lot going on her friend actually passed away yesterday and she's also having a lot of issues with her husband this obviously is weighing heavy on her as far as your thoughts and concerns today. 01/25/18 on evaluation today patient appears to be doing fairly well in regard to her right lateral malleolus.  She has been tolerating the dressing changes without complication. Overall I feel like this is definitely showing signs of improvement as far as how the overall appearance of the wound is there's also evidence of epithelium start to migrate over the granulation tissue. In general I think that she is progressing nicely as far as the wound is concerned. The only concern she really has is whether or not we can switch to every other week visits in order to avoid having as many DEBRALEE, WINIARSKI ([id number]) appointments as her daughters have a difficult time getting her to her appointments as well as the patient's husband to his he is not doing very well at this point. 02/22/18 on evaluation today patient's right lateral malleolus ulcer appears to be doing great. She has been tolerating the dressing changes without complication. Overall you making excellent progress at this time. Patient is having no significant discomfort. 03/15/18 on evaluation today patient appears to be doing much more poorly in regard to her right lateral ankle ulcer at this point. Unfortunately since have last seen her her husband has passed just a few days ago is obviously weighed heavily on her her daughter also had surgery well she is with her today as usual. There does not appear to be any evidence of infection she does seem to have significant contusion/deep tissue injury to the right lateral malleolus which was not noted previous when I saw her last. It's hard to tell of exactly when this injury occurred although during the time she was spending the night in the hospital this may have been most likely. 03/22/18 on evaluation today patient appears to actually be doing very well in regard to her ulcer. She did unfortunately have a setback which was noted last week however the good news is we seem to be getting back on track and in fact the wound in the core did still have some necrotic tissue which will be addressed at this point  today but in general I'm seeing signs that things are on the up and up. She is glad to hear this obviously she's been somewhat concerned that due to the how her wound digressed more recently. 03/29/18 on evaluation today patient appears to be doing fairly well in regard to her right lower extremity lateral malleolus ulcer. She unfortunately does have a new area of pressure injury over the inferior portion where the wound has opened up a little bit larger secondary to the pressure she seems to be getting. She does tell me sometimes when she sleeps at night that it actually hurts and does seem to be pushing on the area little bit more unfortunately. There does not appear to be any evidence of infection which is good news. She has been tolerating the dressing changes without complication. She also did have some bruising in the left second and third toes due to the fact that she may have bump this or injured it although she has neuropathy so she does not feel she did move recently that may have been where this came from. Nonetheless there  does not appear to be any evidence of infection at this time. 04/12/18 on evaluation today patient's wound on the right lateral ankle actually appears to be doing a little bit better with a lot of necrotic docking tissue centrally loosening up in clearing away. However she does have the beginnings of a deep tissue injury on the left lateral malleolus likely due to the fact we've been trying offload the right as much as we have. I think she may benefit from an assistive soft device to help with offloading and it looks like they're looking at one of the doughnut conditions that wraps around the lower leg to offload which I think will definitely do a good job. With that being said I think we definitely need to address this issue on the left before it becomes a wound. Patient is not having significant pain. 04/19/18 on evaluation today patient appears to be doing excellent in  regard to the progress she's made with her right lateral ankle ulcer. The left ankle region which did show evidence of a deep tissue injury seems to be resolving there's little fluid noted underneath and a blister there's nothing open at this point in time overall I feel like this is progressing nicely which is good news. She does not seem to be having significant discomfort at this point which is also good news. 04/25/18-She is here in follow up evaluation for bilateral lateral malleolar ulcers. The right lateral malleolus ulcer with pale subcutaneous tissue exposure, central area of ulcer with tendon/periosteum exposed. The left lateral malleolus ulcer now with central area of nonviable tissue, otherwise deep tissue injury. She is wearing compression wraps to the left lower extremity, she will place the right lower extremity compression wraps on when she gets home. She will be out of town over the weekend and return next week and follow-up appointment. She completed her doxycycline this morning 05/03/18 on evaluation today patient appears to be doing very well in regard to her right lateral ankle ulcer in general. At least she's showing some signs of improvement in this regard. Unfortunately she has some additional injury to the left lateral malleolus region which appears to be new likely even over the past several days. Again this determination is based on the overall appearance. With that being said the patient is obviously frustrated about this currently. 05/10/18-She is here in follow-up evaluation for bilateral lateral malleolar ulcers. She states she has purchased offloading shoes/boots and they will arrive tomorrow. She was asked to bring them in the office at next week's appointment so her provider is aware of product being utilized. She continues to sleep on right or left side, she has been encouraged to sleep on her back. The right lateral malleolus ulcer is precariously close to peri-osteum;  will order xray. The left lateral malleolus ulcer is improved. Will switch back to santyl; she will follow up next week. 05/17/18 on evaluation today patient actually appears to be doing very well in regard to her malleolus her ulcers compared to last time I saw them. She does not seem to have as much in the way of contusion at this point which is great news. With that being said she does continue to have discomfort and I do believe that she is still continuing to benefit from the offloading/pressure reducing boots that were recommended. I think this is the key to trying to get this to heal up completely. 05/24/18 on evaluation today patient actually appears to be doing worse at this point  in time unfortunately compared to her last week's evaluation. She is having really no increased pain which is good news unfortunately she does have more maceration in your theme and noted surrounding the right lateral ankle the left lateral ankle is not really is erythematous I do not see signs of the overt cellulitis on that side. Unfortunately the wounds do not seem to have shown any signs of improvement since the last evaluation. She also has significant swelling especially on the right compared to previous some of this may be due to infection however also think that she may be served better while she has these wounds by compression wrapping versus continuing to use the Juxta-Lite for the time being. Especially with the amount of drainage that she is experiencing at this point. No fevers, chills, nausea, or vomiting noted at this time. 05/31/18 on evaluation today patient appears to actually be doing better in regard to her right lateral lower extremity ulcer specifically on the malleolus region. She has been tolerating the antibiotic without complication. With that being said she still continues to have issues but a little bit of redness although nothing like she what she was experiencing previous. She still  continues to pressure to her ankle area she did get the problem on offloading boots unfortunately she will not wear them she states there too uncomfortable and she can't get in and out of the bed. Nonetheless at this point her wounds seem to be continually getting worse which is not what we want I'm getting somewhat concerned about her progress and how things are going to proceed if we do not intervene in some way shape or form. I therefore had a very lengthy conversation today about offloading yet again and even made a specific suggestion for switching her to a memory foam mattress and even gave the information for a specific one that they could look at getting if it was something that they were interested in considering. She does not want to be considered for a hospital bed air mattress although honestly insurance would not cover it that she does not have any wounds on her trunk. 06/14/18 on evaluation today both wounds over the bilateral lateral malleolus her ulcers appear to be doing better there's no evidence of pressure injury at this point. She did get the foam mattress for her bed and this does seem to have been extremely beneficial for her in my pinion. Her daughter states that she is having difficulty getting out of bed because of how soft it is. The patient also relates this to be. Nonetheless I do feel like she's actually doing better. Unfortunately right after and around the time she was getting the mattress she also sustained a fall when she got up to go pick up the phone and ended up injuring her right elbow she has 18 sutures in place. We are not caring for this currently although home health is going to be taking the sutures out shortly. Nonetheless this may be something that we need to evaluate going forward. It depends on JAMILLA, SHINGLEDECKER ([id number]) how well it has or has not healed in the end. She also recently saw an orthopedic specialist for an injection in the right shoulder  just before her fall unfortunately the fall seems to have worsened her pain. 06/21/18 on evaluation today patient appears to be doing about the same in regard to her lateral malleolus ulcers. Both appear to be just a little bit deeper but again we are clinging away  the necrotic and dead tissue which I think is why this is progressing towards a deeper realm as opposed to improving from my measurement standpoint in that regard. Nonetheless she has been tolerating the dressing changes she absolutely hates the memory foam mattress topper that was obtained for her nonetheless I do believe this is still doing excellent as far as taking care of excess pressure in regard to the lateral malleolus regions. She in fact has no pressure injury that I see whereas in weeks past it was week by week I was constantly seeing new pressure injuries. Overall I think it has been very beneficial for her. 07/03/18; patient arrives in my clinic today. She has deep punched out areas over her bilateral lateral malleoli. The area on the right has some more depth. We spent a lot of time today talking about pressure relief for these areas. This started when her daughter asked for a prescription for a memory foam mattress. I have never written a prescription for a mattress and I don't think insurances would pay for that on an ordinary bed. In any case he came up that she has foam boots that she refuses to wear. I would suggest going to these before any other offloading issues when she is in bed. They say she is meticulous about offloading this the rest of the day 07/10/18- She is seen in follow-up evaluation for bilateral, lateral malleolus ulcers. There is no improvement in the ulcers. She has purchased and is sleeping on a memory foam mattress/overlay, she has been using the offloading boots nightly over the past week. She has a follow up appointment with vascular medicine at the end of October, in my opinion this follow up should be  expedited given her deterioration and suboptimal TBI results. We will order plain film xray of the left ankle as deeper structures are palpable; would consider having MRI, regardless of xray report(s). The ulcers will be treated with iodoflex/iodosorb, she is unable to safely change the dressings daily with santyl. 07/19/18 on evaluation today patient appears to be doing in general visually well in regard to her bilateral lateral malleolus ulcers. She has been tolerating the dressing changes without complication which is good news. With that being said we did have an x-ray performed on 07/12/18 which revealed a slight loosen see in the lateral portion of the distal left fibula which may represent artifact but underline lytic destruction or osteomyelitis could not be excluded. MRI was recommended. With that being said we can see about getting the patient scheduled for an MRI to further evaluate this area. In fact we have that scheduled currently for August 20 19,019. 07/26/18 on evaluation today patient's wound on the right lateral ankle actually appears to be doing fairly well at this point in my pinion. She has made some good progress currently. With that being said unfortunately in regard to the left lateral ankle ulcer this seems to be a little bit more problematic at this time. In fact as I further evaluated the situation she actually had bone exposed which is the first time that's been the case in the bone appear to be necrotic. Currently I did review patient's note from Dr. Bunnie Domino office with [facility]. He stated that ABI was 1.26 on the right and 0.95 on the left with good waveforms. Her perfusion is stable not reduced from previous studies and her digital waveforms were pretty good particularly on the right. His conclusion upon review of the note was  that there was not much she could do to improve her perfusion and he felt she was adequate for wound healing. His suggestion  was that she continued to see [place] and consider a synthetic skin graft if there was no underlying infection. He plans to see her back in six months or as needed. 08/01/18 on evaluation today patient appears to be doing better in regard to her right lateral ankle ulcer. Her left lateral ankle ulcer is about the same she still has bone involvement in evidence of necrosis. There does not appear to be evidence of infection at this time On the right lateral lower extremity. I have started her on the Augmentin she picked this up and started this yesterday. This is to get her through until she sees infectious disease which is scheduled for 08/12/18. 08/06/18 on evaluation today patient appears to be doing rather well considering my discussion with patient's daughter at the end of last week. The area which was marked where she had erythema seems to be improved and this is good news. With that being said overall the patient seems to be making good improvement when it comes to the overall appearance of the right lateral ankle ulcer although this has been slow she at least is coming around in this regard. Unfortunately in regard to the left lateral ankle ulcer this is osteomyelitis based on the pathology report as well is bone culture. Nonetheless we are still waiting CT scan. Unfortunately the MRI we originally ordered cannot be performed as the patient is a pacemaker which I had overlooked. Nonetheless we are working on the CT scan approval and scheduling as of now. She did go to the hospital over the weekend and was placed on IV Cefzo for a couple of days. Fortunately this seems to have improved the erythema quite significantly which is good news. There does not appear to be any evidence of worsening infection at this time. She did have some bleeding after the last debridement therefore I did not perform any sharp debridement in regard to left lateral ankle at this point. Patient has been approved for a snap vac for  the right lateral ankle. 08/14/18; the patient with wounds over her bilateral lateral malleoli. The area on the right actually looks quite good. Been using a snap back on this area. Healthy granulation and appears to be filling in. Unfortunately the area on the left is really problematic. 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 Also noted to have cellulitis. She saw Dr. Novella Olive of infectious disease today and was put on linezolid. We are able to verify this with her pharmacy. She is completed the Augmentin that she was already on. We've been using Iodoflex to this area 08/23/18 on evaluation today patient's wounds both actually appear to be doing better compared to my prior evaluations. Fortunately she showing signs of good improvement in regard to the overall wound status especially where were using the snap vac on the right. In regard to left lateral malleolus the wound bed actually appears to be much cleaner than previously noted. I do not feel any phone directly probed during evaluation today and though there is tendon noted this does not appear to be necrotic it's actually fairly good as far as the overall appearance of the tendon is concerned. In general the wound bed actually appears to be doing significantly better than it was previous. Patient is currently in the  care of Dr. Linus [name] and I did review that note today. He actually has her on two weeks of linezolid and then following the patient will be on 1-2 months of Keflex. That is the plan currently. She has been on antibiotics therapy as prescribed by myself initially starting on July 30, 2018 and has been on that continuously up to this point. 08/30/18 on evaluation today patient actually appears to be doing much better in regard to her right lateral malleolus ulcer. She has been tolerating the dressing changes specifically the snap vac without complication  although she did have some issues with the seal currently. Apparently there was some trouble with getting it to maintain over the past week past Sunday. Nonetheless overall the wound appears better in regard to the right lateral malleolus region. In regard to left lateral malleolus this actually show some signs of additional granulation although there still tendon noted in the base of the wound this appears to be healthy not necrotic in any way whatsoever. We are considering potentially using a snap vac for the left lateral malleolus as well the product wrap from [employer], [place], was present in the clinic today we're going to see this patient I did have her come in with me after obtaining consent from the patient and her daughter in order to look at the wound and see if there's any recommendation one way or another as to whether or not they felt the snapback could be beneficial for the left lateral malleolus region. But RHYLIN, [name] ([id number]) the conclusion was that it might be but that this is definitely a little bit deeper wound than what traditionally would be utilized for a snap vac. 09/06/18 on evaluation today patient actually appears to be doing excellent in my pinion in regard to both ankle ulcers. She has been tolerating the dressing changes without complication which is great news. Specifically we have been using the snap vac. In regard to the right ankle I'm not even sure that this is going to be necessary for today and following as the wound has filled in quite nicely. In regard to the left ankle I do believe that we're seeing excellent epithelialization from the edge as well as granulation in the central portion the tendon is still exposed but there's no evidence of necrotic bone and in general I feel like the patient has made excellent progress even compared to last week with just one week of the snap vac. 09/11/18; this is a patient who has wounds on her bilateral lateral malleoli.  Initially both of these were deep stage IV wounds in the setting of chronic arterial insufficiency. She has been revascularized. As I understand think she been using snap vacs to both of these wounds however the area on the right became more superficial and currently she is only using it on the left. Using silver collagen on the right and silver collagen under the back on the left I believe 09/19/18 on evaluation today patient actually appears to be doing very well in regard to her lateral malleolus or ulcers bilaterally. She has been tolerating the dressing changes without complication. Fortunately there does not appear to be any evidence of infection at this time. Overall I feel like she is improving in an excellent manner and I'm very pleased with the fact that everything seems to be turning towards the better for her. This has obviously been a long road. 09/27/18 on evaluation today patient actually appears to be doing very well in regard  to her bilateral lateral malleolus ulcers. She has been tolerating the dressing changes without complication. Fortunately there does not appear to be any evidence of infection at this time which is also great news. No fevers, chills, nausea, or vomiting noted at this time. Overall I feel like she is doing excellent with the snap vac on the left malleolus. She had 40 mL of fluid collection over the past week. 10/04/18 on evaluation today patient actually appears to be doing well in regard to her bilateral lateral malleolus ulcers. She continues to tolerate the dressing changes without complication. One issue that I see is the snap vac on the left lateral malleolus which appears to have sealed off some fluid underlying this area and has not really allowed it to heal to the degree that I would like to see. For that reason I did suggest at this point we may want to pack a small piece of packing strip into this region to allow it to more effectively wick out  fluid. 10/11/18 in general the patient today does not feel that she has been doing very well. She's been a little bit lethargic and subsequently is having bodyaches as well according to what she tells me today. With that being said overall she has been concerned with the fact that something may be worsening although to be honest her wounds really have not been appearing poorly. She does have a new ulcer on her left heel unfortunately. This may be pressure related. Nonetheless it seems to me to have potentially started at least as a blister I do not see any evidence of deep tissue injury. In regard to the left ankle the snap vac still seems to be causing the ceiling off of the deeper part of the wound which is in turn trapping fluid. I'm not extremely pleased with the overall appearance as far as progress from last week to this week therefore I'm gonna discontinue the snap vac at this point. 10/18/18 patient unfortunately this point has not been feeling well for the past several days. She was seen by Grayland Ormond her primary care provider who is a Librarian, academic at [facility]. Subsequently she states that she's been very weak and generally feeling malaise. No fevers, chills, nausea, or vomiting noted at this time. With that being said bloodwork was performed at the PCP office on the 11th of this month which showed a white blood cell count of 10.7. This was repeated today and shows a white blood cell count of 12.4. This does show signs of worsening. Coupled with the fact that she is feeling worse and that her left ankle wound is not really showing signs of improvement I feel like this is an indication that the osteomyelitis is likely exacerbating not improving. Overall I think we may also want to check her C-reactive protein and sedimentation rate. Actually did call [facility] office this afternoon while the patient was in the office here with me. Subsequently based on the findings we  discussed treatment possibilities and I think that it is appropriate for [place] to go ahead and initiate treatment with doxycycline which I'm going to do. Subsequently he did agree to see about adding a CRP and sedimentation rate to her orders. If that has not already been drawn to where they can run it they will contact the patient she can come back to have that check. They are in agreement with plan as far as the patient and her daughter are concerned. Nonetheless also think  we need to get in touch with Dr. Henreitta Leber office to see about getting the patient scheduled with him as soon as possible. 11/08/18 on evaluation today patient presents for follow-up concerning her bilateral foot and ankle ulcers. I did do an extensive review of her chart in epic today. Subsequently she was seen by Dr. Linus [name] he did initiate Cefepime IV antibiotic therapy. Subsequently she had some issues with her PICC line this had to be removed because it was coiled and then replaced. Fortunately that was now settled. Unfortunately she has continued have issues with her left heel as well as the issues that she is experiencing with her bilateral lateral malleolus regions. I do believe however both areas seem to be doing a little bit better on evaluation today which is good news. No fevers, chills, nausea, or vomiting noted at this time. She actually has an angiogram schedule with Dr. dew on this coming Monday, November 11, 2018. Subsequently the patient states that she is feeling much better especially than what she was roughly 2 weeks ago. She actually had to cancel an appointment because she was feeling so poorly. No fevers, chills, nausea, or vomiting noted at this time. 11/15/18 on evaluation today patient actually is status post having had her angiogram with Dr. dew Monday, four days ago. 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 He had to go and work on several areas of the vasculature fortunately he was  able to obtain no more than a 30% residual stenosis throughout post procedure. I reviewed this note today. I think this will definitely help with healing at this time. Fortunately there does not appear to be any signs of infection and I do feel like ratio already has a better appearance to it. 11/22/18 upon evaluation today patient actually appears to be doing very well in regard to her wounds in general. The right lateral malleolus looks excellent the heel looks better in the left lateral malleolus also appears to be doing a little better. With that being said the right second toe actually appears to be open and training we been watching this is been dry and stable but now is open. 12/03/2018 Seen today for follow-up and management of multiple bilateral lower extremity wounds. New pressure injury of the great toe which is closed at this time. Wound of the right distal second toe appears larger today with deep undermining and a pocket of fluid present within the undermining region. Left and right malleolus is wounds are stable today with no signs and symptoms of infection.Denies any needs or concerns during exam today. 12/13/18 on evaluation today patient appears to be doing somewhat better in regard to her left heel ulcer. She also seems to be completely healed in regard to the right lateral malleolus ulcer. The left malleolus ulcer is smaller what unfortunately the wounds which are new over the first and second toes of the right foot are what are most concerning at this point especially the second. Both areas did require sharp debridement today. 12/20/18 on evaluation today patient's wound actually appears to be doing better in regard to left lateral ankle and her right lateral ankle continues to remain healed. The hill ulcer on the left is improved. She does have improvement noted as well in regard to both toe ulcers. Overall I'm very pleased in this regard. No fevers, chills, nausea, or vomiting  noted at this time. [name], [name] ([id number]) 12/23/18 on evaluation today patient is seen  after she had her toenails trimmed at the podiatrist office due to issues with her right great toe. There was what appeared to be dark eschar on the surface of the wound which had her in the podiatrist concerned. Nonetheless as I remember that during the last office visit I had utilize silver nitrate of this area I was much less concerned about the situation. Subsequently I was able to clean off much of this tissue without any complication today. This does not appear to show any signs of infection and actually look somewhat better compared to last time post debridement. Her second toe on the right foot actually had callous over and there did appear still be some fluid underneath this that would require debridement today. 12/27/18 on evaluation today patient actually appears to be showing signs of improvement at all locations. Even the left lateral ankle although this is not quite as great as the other sites. Fortunately there does not appear to be any signs of infection at this time and both of her toes on the right foot seem to be showing signs of improvement which is good news and very pleased in this regard. 01/03/19 on evaluation today patient appears to be doing better for the most part in regard to her wounds in particular. There does not appear to be any evidence of infection at this time which is good news. Fortunately there is no sign of really worsening anywhere except for the right great toe which she does have what appears to be a bruise/deep tissue injury which is very superficial and already resolving. I'm not sure where this came from I questioned her extensively and she does not recall what may have happened with this. Other than that the patient seems to be doing well even the left lateral ankle ulcer looks good and is getting smaller. 01/10/19 on evaluation today patient appears to be doing  well in regard to her left heel wound and both of her toe wounds. Overall I feel like there is definitely improvement here and I'm happy in that regard. With that being said unfortunately she is having issues with the left lateral malleolus ulcer which unfortunately still has a lot of depth to it. This is gonna be a very difficult wound for [place] to be able to truly get to heal. I may want to consider some type of skin substitute to see if this would be of benefit for her. I'll discuss this with her more the next visit most likely. This was something I thought about more at the end of the visit when I was Artie out of the room and the patient had been discharged. 01/17/19 on evaluation today patient appears to be doing very well in regard to her wounds in general. She's been making excellent progress at this time. Fortunately there's no sign of infection at this time either. No fevers, chills, nausea, or vomiting noted at this time. The biggest issue is still her left lateral malleolus where it appears to be doing well and is getting smaller but still shows a small corner where this is deeper and goes down into what appears to be the joint space. Nonetheless this is taking much longer to heal although it still looks better in smaller than previous evaluations. 01/24/19 on evaluation today patient's wounds actually appear to be doing rather well in general overall. She did require some sharp debridement in regard to the right great toe but everything else appears to be doing excellent no debridement was  even necessary. No fevers, chills, nausea, or vomiting noted at this time. 01/31/19 on evaluation today patient actually appears to be doing much better in regard to her left foot wound on the heel as well as the ankle. The right great toe appears to be a little bit worse today this had callous over and trapped a lot of fluid underneath. Fortunately there's no signs of infection at any site which is great  news. 02/07/19 on evaluation today patient actually appears to be doing decently well in regard to all of her ulcers at this point. No sharp debridement was required she is a little bit of hyper granulation in regard to the left lateral ankle as well as the left heel but the hill itself is almost completely healed which is excellent news. Overall been very pleased in this regard. 02/14/19 on evaluation today patient actually appears to be doing very well in regard to her ulcers on the right first toe, left lateral malleolus, and left heel. In fact the heel is almost completely healed at this point. The patient does not show any signs of infection which is good news. Overall very pleased with how things have progressed. 04/18/19 Telehealth Evaluation During the COVID-19 National Emergency: Verbal Consent: Obtained from patient Allergies: reviewed and the active list is current. Medication changes: patient has no current medication changes. COVID-19 Screening: 1. Have you traveled internationally or on a cruise ship in the last 14 dayso No 2. Have you had contact with someone with or under investigation for COVID-19o No 3. Have you had a fever, cough, sore throat, or experiencing shortness of breatho No on evaluation today actually did have a visit with this patient through a telehealth encounter with her home health nurse. Subsequently it was noted that the patient actually appears to be doing okay in regard to her wounds both the right great toe as well as the left lateral malleolus have shown signs of improvement although this in your theme around the left lateral malleolus there eschar coverings for both locations. The question is whether or not they are actually close and whether or not home health needs to discharge the patient or not. Nonetheless my concern is this point obviously is that without actually seeing her and being able to evaluate this directly I cannot ensure that she is completely  healed which is the question that I'm being asked. 04/22/19 on evaluation today patient presents for her first evaluation since last time I saw her which was actually February 14, 2019. I did do a telehealth visit last week in which point it was questionable whether or not she may be healed and had to bring her in today for confirmation. With that being said she does seem to be doing quite well at this point which is good news. There does not appear to be any drainage in the deed I believe her wounds may be healed. Readmission: 09/04/2019 on evaluation today patient appears to be doing unfortunately somewhat more poorly in regard to her left foot ulcer secondary to a wound that began on 08/21/2019 at least when she first noticed this. Fortunately she has not had any evidence of active infection at this time. Systemically. I also do not necessarily see any evidence of infection at the blister/wound site on the first metatarsal head plantar aspect. This almost appears to be something that may have just rubbed inappropriately causing this to breakdown. They did not want a wait too long to come in to be seen  as again she had significant issues in the past with wounds that took quite a while to heal in fact it was close to 2 years. Nonetheless this does not appear to be quite that bad but again we do need to remove some of the necrotic tissue from the surface of the wound to tell exactly the extent. She does not appear to have any significant arterial disease at this point and again her last ABIs and TBI's are recorded above in the alert section her left ABI was 1.27 with a TBI of 0.72 to the right ABI 1.08 with a TBI of 0.39. Other than this the patient has been doing quite well since I last saw her and that was in May 2020. [name], [name] ([id number]) 09/11/2019 on evaluation today patient appeared to be doing very well with regard to her plantar foot ulcer on the left. In fact this appears to be almost  completely healed which is awesome. That is after just 1 week of intervention. With that being said there is no signs of active infection at this time. 09/18/2019 on evaluation today patient actually appears to be doing excellent in fact she is completely healed based on what I am seeing at this point. Fortunately there is no signs of active infection at this time and overall patient is very pleased to hear that this area has healed so quickly. Readmission: 05/13/2021 upon evaluation today this patient presents for reevaluation here in the clinic. This is a wound that actually we previously took care of. She had 1 on the right ankle and the left the left turned out to be be harder due to to heal but nonetheless is doing great at this point as the right that has reopened and it was noted first just several weeks ago with a scab over it and came off in just the past few days. Fortunately there does not appear to be any obvious evidence of significant active infection at this time which is great news. No fevers, chills, nausea, vomiting, or diarrhea. 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 There does not appear to be any signs of this interfering in any way with her wound. She does have swelling we previously had compression socks for her ordered but again it does not look like she wears these on a regular basis by any means. 05/26/2021 upon evaluation today patient appears to be doing well with regard to her wound which is actually showing signs of excellent improvement. There does not appear to be any signs of active infection which is great news and overall very pleased with where things stand today. No fevers, chills, nausea, vomiting, or diarrhea. 06/02/2021 upon evaluation today patient's wound actually showing signs of excellent improvement. Fortunately there does not appear to be any signs of active infection which is great news. I think the  patient is making good progress with regard to her wounds in general. 06/09/2021 upon evaluation today patient appears to be doing excellent in regard to her wounds currently. Fortunately there does not appear to be any signs of active infection which is great news. No fevers, chills, nausea, vomiting, or diarrhea. Overall extremely pleased with where things stand today. I think the patient is making excellent progress. 06/16/2021 upon evaluation today patient appears to be doing well in regard to her wound. This is going require little bit of debridement today and that was discussed with the patient. Otherwise she seems  to be doing quite well and I am actually very pleased with where things stand at this point. No fevers, chills, nausea, vomiting, or diarrhea. 06/23/2021 upon evaluation today patient appears to be doing well with regard to her wounds. She has been tolerating the dressing changes without complication. Fortunately there does not appear to be any evidence of infection and she has not had air in her home which she actually lives at an assisted living that got fixed this morning. With that being said because of that her wrap has been extremely hot and bothersome for her over the past week. 06/30/2021 upon evaluation today patient is actually making excellent progress in regard to her ankle ulcer. She has been tolerating the dressing changes without complication and overall extremely pleased with where things stand there does not appear to be any evidence of active infection which is great news. No fevers, chills, nausea, vomiting, or diarrhea. 07/07/21 upon evaluation today patients and culture on the right actually appears to be doing quite well. There does not appear to be any signs of infection and overall very pleased with where things stand today. No fevers, chills, nausea, or vomiting noted at this time. Objective Constitutional Well-nourished and well-hydrated in no acute  distress. Vitals Time Taken: 11:35 AM, Height: 62 in, Weight: 150 lbs, BMI: 27.4, Temperature: 98.5 F, Pulse: 88 bpm, Respiratory Rate: 18 breaths/min, Blood Pressure: 134/76 mmHg. Respiratory normal breathing without difficulty. Psychiatric this patient is able to make decisions and demonstrates good insight into disease process. Alert and Oriented x 3. pleasant and cooperative. General Notes: Upon inspection patients wound bed actually showed signs of good granulation and a physician at this point. There does not appear to be any evidence of active infection which is great news and a wrong extremely please with where things stand today. Post agreement wound bed appears to be doing even better. Integumentary (Hair, Skin) Wound #7 status is Open. Original cause of wound was Gradually Appeared. The date acquired was: 05/12/2021. The wound has been in treatment 7 weeks. The wound is located on the Right,Lateral Malleolus. The wound measures 0.4cm length x 0.3cm width x 0.2cm depth; 0.094cm^2 area and 0.019cm^3 volume. There is Fat Layer (Subcutaneous Tissue) exposed. There is no tunneling or undermining noted. There is a medium amount of serosanguineous drainage noted. The wound margin is distinct with the outline attached to the wound base. There is large (67-100%) red, pink, pale granulation within the wound bed. There is a small (1-33%) amount of necrotic tissue within the wound bed including Adherent Danh, [name] ([id number]) [place]. Assessment Active Problems ICD-10 Pressure ulcer of right ankle, stage 3 Lymphedema, not elsewhere classified Venous insufficiency (chronic) (peripheral) Presence of cardiac pacemaker Long term (current) use of anticoagulants Procedures Wound #7 Pre-procedure diagnosis of Wound #7 is a Pressure Ulcer located on the Right,Lateral Malleolus . There was a Excisional Skin/Subcutaneous Tissue Debridement with a total area of 0.12 sq cm performed by Tommie Sams., PA-C. With the following instrument(s): Curette to remove Viable and Non-Viable tissue/material. Material removed includes Subcutaneous Tissue, Slough, Skin: Dermis, and Skin: Epidermis after achieving pain control using Lidocaine 4% Topical Solution. A time out was conducted at 11:54, prior to the start of the procedure. A Minimum amount of bleeding was controlled with Pressure. The procedure was tolerated well with a pain level of 0 throughout and a pain level of 0 following the procedure. Post Debridement Measurements: 4cm length x 0.3cm width x 0.2cm depth; 0.188cm^3 volume.  Post debridement Stage noted as Category/Stage III. Character of Wound/Ulcer Post Debridement is improved. Post procedure Diagnosis Wound #7: Same as Pre-Procedure Plan Follow-up Appointments: Return Appointment in 1 week. Nurse Visit as needed Bathing/ Shower/ Hygiene: May shower with wound dressing protected with water repellent cover or cast protector. Edema Control - Lymphedema / Segmental Compressive Device / Other: Optional: One layer of unna paste to top of compression wrap (to act as an anchor). Elevate, Exercise Daily and Avoid Standing for Long Periods of Time. Elevate legs to the level of the heart and pump ankles as often as possible Elevate leg(s) parallel to the floor when sitting. WOUND #7: - Malleolus Wound Laterality: Right, Lateral Cleanser: Soap and Water 1 x Per Week/30 Days Discharge Instructions: Gently cleanse wound with antibacterial soap, rinse and pat dry prior to dressing wounds Primary Dressing: Prisma 4.34 (in) 1 x Per Week/30 Days Discharge Instructions: Moisten w/normal saline or sterile water; Cover wound as directed. Do not remove from wound bed. Secondary Dressing: ABD Pad 5x9 (in/in) 1 x Per Week/30 Days Discharge Instructions: Cover with ABD pad Compression Wrap: Profore Lite LF 3 Multilayer Compression Bandaging System 1 x Per Week/30 Days Discharge Instructions: Apply  3 multi-layer wrap as prescribed. 1. I'm gonna recommend currently that we actually go and continue with the collagen dressing which I think is actually doing a great job at this point. 2. I'm also in recommend we have the patient continue with the three letter compression wrap which I think is doing a great job keeping the edema under good control so for the wound to heal more effectively as well. 3. I'm also gonna recommend the patient should be wearing her compression sock on her left leg shakes as a Velcro compression wrap that would be even better. SAMMI, APRAHAMIAN ([id number]) Please see above for specific wound care orders. We will see patient for re-evaluation in 1 week(s) here in the clinic. If anything worsens or changes patient will contact our office for additional recommendations. Electronic Signature(s) Signed: 07/07/2021 9:44:25 PM By: Worthy Keeler PA-C Entered By: Worthy Keeler on 07/07/2021 20:33:00 Michaux, [name] ([id number]) -------------------------------------------------------------------------------- SuperBill Details Patient Name: [name] Date of Service: 07/07/2021 Medical Record Number: [id number] Patient Account Number: [id number] Date of Birth/Sex: 1923/09/30 (87 y.o. F) Treating RN: Carlene Coria Primary Care Provider: Adrian Prows Other Clinician: Referring Provider: Adrian Prows Treating Provider/Extender: Skipper Cliche in Treatment: 7 Diagnosis Coding ICD-10 Codes Code Description [phone] Pressure ulcer of right ankle, stage 3 I89.0 Lymphedema, not elsewhere classified I87.2 Venous insufficiency (chronic) (peripheral) Z95.0 Presence of cardiac pacemaker Z79.01 Long term (current) use of anticoagulants Facility Procedures CPT4 Code: [id number] Description: [id number] - DEB SUBQ TISSUE 20 SQ CM/< Modifier: Quantity: 1 CPT4 Code: Description: ICD-10 Diagnosis Description 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, stage  3 Modifier: Quantity: Physician Procedures CPT4 Code: [id number] Description: [id number] - WC PHYS SUBQ TISS 20 SQ CM Modifier: Quantity: 1 CPT4 Code: Description: ICD-10 Diagnosis Description 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, stage 3 Modifier: Quantity: Electronic Signature(s) Signed: 07/07/2021 9:44:25 PM By: Worthy Keeler PA-C Entered By: Worthy Keeler on 07/07/2021 20:33:22

## 2021-07-14 ENCOUNTER — Encounter: Payer: Medicare Other | Admitting: Physician Assistant

## 2021-07-14 ENCOUNTER — Other Ambulatory Visit: Payer: Self-pay

## 2021-07-14 DIAGNOSIS — L89513 Pressure ulcer of right ankle, stage 3: Secondary | ICD-10-CM | POA: Diagnosis not present

## 2021-07-15 NOTE — Progress Notes (Signed)
PRISTINE, GLADHILL ([id number]) Visit Report for 07/14/2021 Arrival Information Details Patient Name: [name], [name] Date of Service: 07/14/2021 11:00 AM Medical Record Number: [id number] Patient Account Number: [id number] Date of Birth/Sex: Apr 27, 1923 (87 y.o. F) Treating RN: Carlene Coria Primary Care Stefanny Pieri: Adrian Prows Other Clinician: Referring Kairy Folsom: Adrian Prows Treating Trayson Stitely/Extender: Skipper Cliche in Treatment: 8 Visit Information History Since Last Visit All ordered tests and consults were completed: No Patient Arrived: Ambulatory Added or deleted any medications: No Arrival Time: 11:45 Any new allergies or adverse reactions: No Accompanied By: daughter Had a fall or experienced change in No Transfer Assistance: None activities of daily living that may affect Patient Identification Verified: Yes risk of falls: Secondary Verification Process Completed: Yes Signs or symptoms of abuse/neglect since last visito No Patient Requires Transmission-Based No Hospitalized since last visit: No Precautions: Implantable device outside of the clinic excluding No Patient Has Alerts: Yes cellular tissue based products placed in the center Patient Alerts: Patient on Blood since last visit: Thinner Has Dressing in Place as Prescribed: Yes NOT DIABETIC Has Compression in Place as Prescribed: Yes ***ELIQUIS*** Pain Present Now: No Electronic Signature(s) Signed: 07/14/2021 8:40:56 PM By: Carlene Coria RN Entered By: Carlene Coria on 07/14/2021 11:46:15 Pesqueira, [name] ([id number]) -------------------------------------------------------------------------------- Clinic Level of Care Assessment Details Patient Name: [name] Date of Service: 07/14/2021 11:00 AM Medical Record Number: [id number] Patient Account Number: [id number] Date of Birth/Sex: 02-04-1923 (87 y.o. F) Treating RN: Carlene Coria Primary Care Atom Solivan: Adrian Prows Other  Clinician: Referring Zafira Munos: Adrian Prows Treating Giorgia Wahler/Extender: Skipper Cliche in Treatment: 8 Clinic Level of Care Assessment Items TOOL 1 Quantity Score '[]'  - Use when EandM and Procedure is performed on INITIAL visit 0 ASSESSMENTS - Nursing Assessment / Reassessment '[]'  - General Physical Exam (combine w/ comprehensive assessment (listed just below) when performed on new 0 pt. evals) '[]'  - 0 Comprehensive Assessment (HX, ROS, Risk Assessments, Wounds Hx, etc.) ASSESSMENTS - Wound and Skin Assessment / Reassessment '[]'  - Dermatologic / Skin Assessment (not related to wound area) 0 ASSESSMENTS - Ostomy and/or Continence Assessment and Care '[]'  - Incontinence Assessment and Management 0 '[]'  - 0 Ostomy Care Assessment and Management (repouching, etc.) PROCESS - Coordination of Care '[]'  - Simple Patient / Family Education for ongoing care 0 '[]'  - 0 Complex (extensive) Patient / Family Education for ongoing care '[]'  - 0 Staff obtains Programmer, systems, Records, Test Results / Process Orders '[]'  - 0 Staff telephones HHA, Nursing Homes / Clarify orders / etc '[]'  - 0 Routine Transfer to another Facility (non-emergent condition) '[]'  - 0 Routine Hospital Admission (non-emergent condition) '[]'  - 0 New Admissions / Biomedical engineer / Ordering NPWT, Apligraf, etc. '[]'  - 0 Emergency Hospital Admission (emergent condition) PROCESS - Special Needs '[]'  - Pediatric / Minor Patient Management 0 '[]'  - 0 Isolation Patient Management '[]'  - 0 Hearing / Language / Visual special needs '[]'  - 0 Assessment of Community assistance (transportation, D/C planning, etc.) '[]'  - 0 Additional assistance / Altered mentation '[]'  - 0 Support Surface(s) Assessment (bed, cushion, seat, etc.) INTERVENTIONS - Miscellaneous '[]'  - External ear exam 0 '[]'  - 0 Patient Transfer (multiple staff / Civil Service fast streamer / Similar devices) '[]'  - 0 Simple Staple / Suture removal (25 or less) '[]'  - 0 Complex Staple / Suture removal  (26 or more) '[]'  - 0 Hypo/Hyperglycemic Management (do not check if billed separately) '[]'  - 0 Ankle / Brachial Index (ABI) - do not check if billed separately Has the  patient been seen at the hospital within the last three years: Yes Total Score: 0 Level Of Care: ____ [name] ([id number]) Electronic Signature(s) Signed: 07/14/2021 8:40:56 PM By: Carlene Coria RN Entered By: Carlene Coria on 07/14/2021 12:07:57 Bryand, [name] ([id number]) -------------------------------------------------------------------------------- Compression Therapy Details Patient Name: [name] Date of Service: 07/14/2021 11:00 AM Medical Record Number: [id number] Patient Account Number: [id number] Date of Birth/Sex: May 30, 1923 (87 y.o. F) Treating RN: Carlene Coria Primary Care Ellis Koffler: Adrian Prows Other Clinician: Referring Brodrick Curran: Adrian Prows Treating Elycia Woodside/Extender: Skipper Cliche in Treatment: 8 Compression Therapy Performed for Wound Assessment: Wound #7 Right,Lateral Malleolus Performed By: Jake Church, RN Compression Type: Three Layer Post Procedure Diagnosis Same as Pre-procedure Electronic Signature(s) Signed: 07/14/2021 8:40:56 PM By: Carlene Coria RN Entered By: Carlene Coria on 07/14/2021 12:05:52 Hannan, [name] ([id number]) -------------------------------------------------------------------------------- Encounter Discharge Information Details Patient Name: [name] Date of Service: 07/14/2021 11:00 AM Medical Record Number: [id number] Patient Account Number: [id number] Date of Birth/Sex: 01/23/1923 (87 y.o. F) Treating RN: Carlene Coria Primary Care Carney Saxton: Adrian Prows Other Clinician: Referring Arlynn Stare: Adrian Prows Treating Karrigan Messamore/Extender: Skipper Cliche in Treatment: 8 Encounter Discharge Information Items Post Procedure Vitals Discharge Condition: Stable Temperature (F): 98.4 Ambulatory Status:  Walker Pulse (bpm): 71 Discharge Destination: Home Respiratory Rate (breaths/min): 18 Transportation: Private Auto Blood Pressure (mmHg): 131/72 Accompanied By: self Schedule Follow-up Appointment: Yes Clinical Summary of Care: Patient Declined Electronic Signature(s) Signed: 07/14/2021 8:40:56 PM By: Carlene Coria RN Entered By: Carlene Coria on 07/14/2021 12:51:34 Heaton, [name] ([id number]) -------------------------------------------------------------------------------- Lower Extremity Assessment Details Patient Name: [name] Date of Service: 07/14/2021 11:00 AM Medical Record Number: [id number] Patient Account Number: [id number] Date of Birth/Sex: [birth year] (87 y.o. F) Treating RN: Carlene Coria Primary Care Trust Crago: Adrian Prows Other Clinician: Referring Ivanna Kocak: Adrian Prows Treating Henya Aguallo/Extender: Skipper Cliche in Treatment: 8 Edema Assessment Assessed: [Left: No] [Right: No] Edema: [Left: Ye] [Right: s] Calf Left: Right: Point of Measurement: 33 cm From Medial Instep 28 cm Ankle Left: Right: Point of Measurement: 10 cm From Medial Instep 17 cm Vascular Assessment Pulses: Dorsalis Pedis Palpable: [Right:Yes] Electronic Signature(s) Signed: 07/14/2021 8:40:56 PM By: Carlene Coria RN Entered By: Carlene Coria on 07/14/2021 12:04:54 Stubblefield, [name] ([id number]) -------------------------------------------------------------------------------- Multi Wound Chart Details Patient Name: [name] Date of Service: 07/14/2021 11:00 AM Medical Record Number: [id number] Patient Account Number: [id number] Date of Birth/Sex: [birth year] (87 y.o. F) Treating RN: Carlene Coria Primary Care Lamiyah Schlotter: Adrian Prows Other Clinician: Referring Jett Kulzer: Adrian Prows Treating Synai Prettyman/Extender: Skipper Cliche in Treatment: 8 Vital Signs Height(in): 62 Pulse(bpm): 75 Weight(lbs): 150 Blood Pressure(mmHg): 131/72 Body Mass  Index(BMI): 27 Temperature(F): 98.4 Respiratory Rate(breaths/min): 18 Photos: [N/A:N/A] Wound Location: Right, Lateral Malleolus N/A N/A Wounding Event: Gradually Appeared N/A N/A Primary Etiology: Pressure Ulcer N/A N/A Comorbid History: Cataracts, Congestive Heart Failure, N/A N/A Hypertension, Peripheral Arterial Disease, Osteoarthritis, Neuropathy Date Acquired: 05/12/2021 N/A N/A Weeks of Treatment: 8 N/A N/A Wound Status: Open N/A N/A Measurements L x W x D (cm) 0.7x0.6x0.2 N/A N/A Area (cm) : 0.33 N/A N/A Volume (cm) : 0.066 N/A N/A % Reduction in Area: 70.60% N/A N/A % Reduction in Volume: 70.70% N/A N/A Classification: Category/Stage III N/A N/A Exudate Amount: Medium N/A N/A Exudate Type: Serosanguineous N/A N/A Exudate Color: red, brown N/A N/A Wound Margin: Distinct, outline attached N/A N/A Granulation Amount: Large (67-100%) N/A N/A Granulation Quality: Red, Pink, Pale N/A N/A Necrotic Amount: Small (1-33%) N/A N/A Exposed Structures: Fat Layer (Subcutaneous  Tissue): N/A N/A Yes Fascia: No Tendon: No Muscle: No Joint: No Bone: No Epithelialization: Small (1-33%) N/A N/A Treatment Notes Electronic Signature(s) Signed: 07/14/2021 8:40:56 PM By: Carlene Coria RN Entered By: Carlene Coria on 07/14/2021 12:05:12 Caracci, [name] ([id number]) -------------------------------------------------------------------------------- Graball Details Patient Name: [name] Date of Service: 07/14/2021 11:00 AM Medical Record Number: [id number] Patient Account Number: [id number] Date of Birth/Sex: 1923/05/21 (87 y.o. F) Treating RN: Carlene Coria Primary Care Randell Detter: Adrian Prows Other Clinician: Referring Cheyanne Lamison: Adrian Prows Treating Faiza Bansal/Extender: Skipper Cliche in Treatment: 8 Active Inactive Wound/Skin Impairment Nursing Diagnoses: Knowledge deficit related to ulceration/compromised skin  integrity Goals: Patient/caregiver will verbalize understanding of skin care regimen Date Initiated: 05/13/2021 Date Inactivated: 05/13/2021 Target Resolution Date: 06/12/2021 Goal Status: Met Ulcer/skin breakdown will have a volume reduction of 30% by week 4 Date Initiated: 05/13/2021 Date Inactivated: 06/16/2021 Target Resolution Date: 06/12/2021 Goal Status: Met Ulcer/skin breakdown will have a volume reduction of 50% by week 8 Date Initiated: 05/13/2021 Target Resolution Date: 07/13/2021 Goal Status: Active Ulcer/skin breakdown will have a volume reduction of 80% by week 12 Date Initiated: 05/13/2021 Target Resolution Date: 08/13/2021 Goal Status: Active Ulcer/skin breakdown will heal within 14 weeks Date Initiated: 05/13/2021 Target Resolution Date: 09/12/2021 Goal Status: Active Interventions: Assess patient/caregiver ability to obtain necessary supplies Assess patient/caregiver ability to perform ulcer/skin care regimen upon admission and as needed Assess ulceration(s) every visit Notes: Electronic Signature(s) Signed: 07/14/2021 8:40:56 PM By: Carlene Coria RN Entered By: Carlene Coria on 07/14/2021 12:05:02 [name] ([id number]) -------------------------------------------------------------------------------- Pain Assessment Details Patient Name: [name] Date of Service: 07/14/2021 11:00 AM Medical Record Number: [id number] Patient Account Number: [id number] Date of Birth/Sex: 07-19-1923 (87 y.o. F) Treating RN: Carlene Coria Primary Care Nylia Gavina: Adrian Prows Other Clinician: Referring Ranjit Ashurst: Adrian Prows Treating Iyannah Blake/Extender: Skipper Cliche in Treatment: 8 Active Problems Location of Pain Severity and Description of Pain Patient Has Paino No Site Locations Pain Management and Medication Current Pain Management: Electronic Signature(s) Signed: 07/14/2021 8:40:56 PM By: Carlene Coria RN Entered By: Carlene Coria on 07/14/2021  11:48:47 Knarr, [name] ([id number]) -------------------------------------------------------------------------------- Patient/Caregiver Education Details Patient Name: [name] Date of Service: 07/14/2021 11:00 AM Medical Record Number: [id number] Patient Account Number: [id number] Date of Birth/Gender: 03-Jun-1923 (87 y.o. F) Treating RN: Carlene Coria Primary Care Physician: Adrian Prows Other Clinician: Referring Physician: Adrian Prows Treating Physician/Extender: Skipper Cliche in Treatment: 8 Education Assessment Education Provided To: Patient Education Topics Provided Wound/Skin Impairment: Methods: Explain/Verbal Responses: State content correctly Electronic Signature(s) Signed: 07/14/2021 8:40:56 PM By: Carlene Coria RN Entered By: Carlene Coria on 07/14/2021 12:08:15 [name] ([id number]) -------------------------------------------------------------------------------- Wound Assessment Details Patient Name: [name] Date of Service: 07/14/2021 11:00 AM Medical Record Number: [id number] Patient Account Number: [id number] Date of Birth/Sex: Dec 17, 1922 (87 y.o. F) Treating RN: Carlene Coria Primary Care Gracelee Stemmler: Adrian Prows Other Clinician: Referring Lewis Keats: Adrian Prows Treating Adasyn Mcadams/Extender: Skipper Cliche in Treatment: 8 Wound Status Wound Number: 7 Primary Pressure Ulcer Etiology: Wound Location: Right, Lateral Malleolus Wound Open Wounding Event: Gradually Appeared Status: Date Acquired: 05/12/2021 Comorbid Cataracts, Congestive Heart Failure, Hypertension, Weeks Of Treatment: 8 History: Peripheral Arterial Disease, Osteoarthritis, Neuropathy Clustered Wound: No Photos Wound Measurements Length: (cm) 0.7 Width: (cm) 0.6 Depth: (cm) 0.2 Area: (cm) 0.33 Volume: (cm) 0.066 % Reduction in Area: 70.6% % Reduction in Volume: 70.7% Epithelialization: Small (1-33%) Tunneling: No Undermining:  No Wound Description Classification: Category/Stage III Wound Margin: Distinct, outline attached Exudate Amount: Medium Exudate  Type: Serosanguineous Exudate Color: red, brown Foul Odor After Cleansing: No Slough/Fibrino Yes Wound Bed Granulation Amount: Large (67-100%) Exposed Structure Granulation Quality: Red, Pink, Pale Fascia Exposed: No Necrotic Amount: Small (1-33%) Fat Layer (Subcutaneous Tissue) Exposed: Yes Necrotic Quality: Adherent Slough Tendon Exposed: No Muscle Exposed: No Joint Exposed: No Bone Exposed: No Treatment Notes Wound #7 (Malleolus) Wound Laterality: Right, Lateral Cleanser Soap and Water Discharge Instruction: Gently cleanse wound with antibacterial soap, rinse and pat dry prior to dressing wounds Peri-Wound Care Meyn, [name] ([id number]) Topical Primary Dressing Prisma 4.34 (in) Discharge Instruction: Moisten w/normal saline or sterile water; Cover wound as directed. Do not remove from wound bed. Secondary Dressing ABD Pad 5x9 (in/in) Discharge Instruction: Cover with ABD pad Secured With Compression Wrap Profore Lite LF 3 Multilayer Compression Bandaging System Discharge Instruction: Apply 3 multi-layer wrap as prescribed. Compression Stockings Add-Ons Electronic Signature(s) Signed: 07/14/2021 8:40:56 PM By: Carlene Coria RN Entered By: Carlene Coria on 07/14/2021 11:54:28 Alas, [name] ([id number]) -------------------------------------------------------------------------------- Vitals Details Patient Name: [name] Date of Service: 07/14/2021 11:00 AM Medical Record Number: [id number] Patient Account Number: [id number] Date of Birth/Sex: Feb 21, 1923 (87 y.o. F) Treating RN: Carlene Coria Primary Care Azara Gemme: Adrian Prows Other Clinician: Referring Everet Flagg: Adrian Prows Treating Mckaylah Bettendorf/Extender: Skipper Cliche in Treatment: 8 Vital Signs Time Taken: 11:46 Temperature (F): 98.4 Height (in):  62 Pulse (bpm): 71 Weight (lbs): 150 Respiratory Rate (breaths/min): 18 Body Mass Index (BMI): 27.4 Blood Pressure (mmHg): 131/72 Reference Range: 80 - 120 mg / dl Electronic Signature(s) Signed: 07/14/2021 8:40:56 PM By: Carlene Coria RN Entered By: Carlene Coria on 07/14/2021 11:48:41

## 2021-07-15 NOTE — Progress Notes (Signed)
[name], [name] ([id number]) Visit Report for 07/14/2021 Chief Complaint Document Details Patient Name: [name], [name]. Date of Service: 07/14/2021 11:00 AM Medical Record Number: [id number] Patient Account Number: [id number] Date of Birth/Sex: 1923-04-16 (87 y.o. F) Treating RN: Carlene Coria Primary Care Provider: Adrian Prows Other Clinician: Referring Provider: Adrian Prows Treating Provider/Extender: Skipper Cliche in Treatment: 8 Information Obtained from: Patient Chief Complaint Right foot ulcer Electronic Signature(s) Signed: 07/14/2021 12:58:59 PM By: Worthy Keeler PA-C Entered By: Worthy Keeler on 07/14/2021 12:58:58 [name], [name] ([id number]) -------------------------------------------------------------------------------- Debridement Details Patient Name: [name] Date of Service: 07/14/2021 11:00 AM Medical Record Number: [id number] Patient Account Number: [id number] Date of Birth/Sex: 1923-07-31 (87 y.o. F) Treating RN: Carlene Coria Primary Care Provider: Adrian Prows Other Clinician: Referring Provider: Adrian Prows Treating Provider/Extender: Skipper Cliche in Treatment: 8 Debridement Performed for Wound #7 Right,Lateral Malleolus Assessment: Performed By: Physician Tommie Sams., PA-C Debridement Type: Debridement Level of Consciousness (Pre- Awake and Alert procedure): Pre-procedure Verification/Time Out Yes - 12:04 Taken: Start Time: 12:04 Pain Control: Lidocaine 4% Topical Solution Total Area Debrided (L x W): 0.7 (cm) x 0.5 (cm) = 0.35 (cm) Tissue and other material Viable, Non-Viable, Slough, Subcutaneous, Skin: Dermis , Skin: Epidermis, Slough debrided: Level: Skin/Subcutaneous Tissue Debridement Description: Excisional Instrument: Curette Bleeding: Minimum Hemostasis Achieved: Pressure End Time: 12:08 Procedural Pain: 0 Post Procedural Pain: 0 Response to Treatment: Procedure was tolerated  well Level of Consciousness (Post- Awake and Alert procedure): Post Debridement Measurements of Total Wound Length: (cm) 0.7 Stage: Category/Stage III Width: (cm) 0.6 Depth: (cm) 0.2 Volume: (cm) 0.066 Character of Wound/Ulcer Post Debridement: Improved Post Procedure Diagnosis Same as Pre-procedure Electronic Signature(s) Signed: 07/14/2021 5:38:02 PM By: Worthy Keeler PA-C Signed: 07/14/2021 8:40:56 PM By: Carlene Coria RN Entered By: Carlene Coria on 07/14/2021 12:07:47 [name], [name] ([id number]) -------------------------------------------------------------------------------- HPI Details Patient Name: [name] Date of Service: 07/14/2021 11:00 AM Medical Record Number: [id number] Patient Account Number: [id number] Date of Birth/Sex: [birth year] (87 y.o. 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 Dr. Amalia Hailey the podiatrist saw her on 07/20/2017 and sent her to the wound center for possible hyperbaric oxygen therapy. past medical history of peripheral vascular disease, varicose veins, status post appendectomy, basal cell carcinoma excision from the left leg, cholecystectomy, pacemaker placement, right lower extremity angiography done by Dr. dew in March 2017 with placement of a stent. there is also note of a successful ablation of the right small saphenous vein done which was reviewed by ultrasound on 10/24/2016. the patient had a right small saphenous vein ablation done on 10/20/2016. The patient has never been a smoker. She has been seen by Dr. Corene Cornea dew the vascular surgeon who most  recently saw her on 06/15/2017 for evaluation of ongoing problems with right leg swelling. She had a lower extremity arterial duplex examination done(02/13/17) which showed patent distal right superficial femoral artery stent and above-the-knee popliteal stent without evidence of restenosis. The ABI was more than 1.3 on the right and more than 1.3 on the left. This was consistent with noncompressible arteries due to medial calcification. The right great toe pressure and PPG waveforms are within normal limits and the left  great toe pressure and PPG waveforms are decreased. he recommended she continue to wear her compression stockings and continue with elevation. She is scheduled to have a noninvasive arterial study in the near future 08/16/2017 -- had a lower extremity arterial duplex examination done which showed patent distal right superficial femoral artery stent and above-the-knee popliteal stent without evidence of restenosis. The ABI was more than 1.3 on the right and more than 1.3 on the left. This was consistent with noncompressible arteries due to medial calcification. The right great toe pressure and PPG waveforms are within normal limits and the left great toe pressure and PPG waveforms are decreased. the x-ray of the right ankle has not yet been done 08/24/2017 -- had a right ankle x-ray -- IMPRESSION:1. No fracture, bone lesion or evidence of osteomyelitis. 2. Lateral soft tissue swelling with a soft tissue ulcer. she has not yet seen the vascular surgeon for review 08/31/17 on evaluation today patient's wound appears to be showing signs of improvement. She still with her appointment with vascular in order to review her results of her vascular study and then determine if any intervention would be recommended at that time. No fevers, chills, nausea, or vomiting noted at this time. She has been tolerating the dressing changes without complication. 09/28/17 on evaluation today patient's  wound appears to show signs of good improvement in regard to the granulation tissue which is surfacing. There is still a layer of slough covering the wound and the posterior portion is still significantly deeper than the anterior nonetheless there has been some good sign of things moving towards the better. She is going to go back to Dr. dew for reevaluation to ensure her blood flow is still appropriate. That will be before her next evaluation with [place] next week. No fevers, chills, nausea, or vomiting noted at this time. Patient does have some discomfort rated to be a 3-4/10 depending on activity specifically cleansing the wound makes it worse. 10/05/2017 -- the patient was seen by Dr. Lucky Cowboy last week and noninvasive studies showed a normal right ABI with brisk triphasic waveforms consistent with no arterial insufficiency including normal digital pressures. The duplex showed a patent distal right SFA stent and the proximal SFA was also normal. He was pleased with her test and thought she should have enough of perfusion for normal wound healing. He would see her back in 6 months time. 12/21/17 on evaluation today patient appears to be doing fairly well in regard to her right lateral ankle wound. Unfortunately the main issue that she is expansion at this point is that she is having some issues with what appears to be some cellulitis in the right anterior shin. She has also been noting a little bit of uncomfortable feeling especially last night and her ankle area. I'm afraid that she made the developing a little bit of an infection. With that being said I think it is in the early stages. 12/28/17 on evaluation today patient's ankle appears to be doing excellent. She's making good progress at this point the cellulitis seems to have improved after last week's evaluation. Overall she is having no significant discomfort which is excellent news. She does have an appointment with Dr. dew on March 29, 2018 for  reevaluation in regard to the stent he placed. She seems to have excellent blood flow in the right lower extremity. 01/19/12 on evaluation today patient's wound appears to be doing very well. In fact she does not appear to require debridement at this point,  there's no evidence of infection, and overall from the standpoint of the wound she seems to be doing very well. With that being said I believe that it may be time to switch to different dressing away from the [facility] Dressing she tells me she does have a lot going on her friend actually passed away yesterday and she's also having a lot of issues with her husband this obviously is weighing heavy on her as far as your thoughts and concerns today. 01/25/18 on evaluation today patient appears to be doing fairly well in regard to her right lateral malleolus. She has been tolerating the dressing changes without complication. Overall I feel like this is definitely showing signs of improvement as far as how the overall appearance of the wound is there's also evidence of epithelium start to migrate over the granulation tissue. In general I think that she is progressing nicely as far as the wound is concerned. The only concern she really has is whether or not we can switch to every other week visits in order to avoid having as many appointments as her daughters have a difficult time getting her to her appointments as well as the patient's husband to his he is not doing very well at this point. 02/22/18 on evaluation today patient's right lateral malleolus ulcer appears to be doing great. She has been tolerating the dressing changes without complication. Overall you making excellent progress at this time. Patient is having no significant discomfort. [name], [name] ([id number]) 03/15/18 on evaluation today patient appears to be doing much more poorly in regard to her right lateral ankle ulcer at this point. Unfortunately since have last seen her her  husband has passed just a few days ago is obviously weighed heavily on her her daughter also had surgery well she is with her today as usual. There does not appear to be any evidence of infection she does seem to have significant contusion/deep tissue injury to the right lateral malleolus which was not noted previous when I saw her last. It's hard to tell of exactly when this injury occurred although during the time she was spending the night in the hospital this may have been most likely. 03/22/18 on evaluation today patient appears to actually be doing very well in regard to her ulcer. She did unfortunately have a setback which was noted last week however the good news is we seem to be getting back on track and in fact the wound in the core did still have some necrotic tissue which will be addressed at this point today but in general I'm seeing signs that things are on the up and up. She is glad to hear this obviously she's been somewhat concerned that due to the how her wound digressed more recently. 03/29/18 on evaluation today patient appears to be doing fairly well in regard to her right lower extremity lateral malleolus ulcer. She unfortunately does have a new area of pressure injury over the inferior portion where the wound has opened up a little bit larger secondary to the pressure she seems to be getting. She does tell me sometimes when she sleeps at night that it actually hurts and does seem to be pushing on the area little bit more unfortunately. There does not appear to be any evidence of infection which is good news. She has been tolerating the dressing changes without complication. She also did have some bruising in the left second and third toes due to the fact that she may have  bump this or injured it although she has neuropathy so she does not feel she did move recently that may have been where this came from. Nonetheless there does not appear to be any evidence of infection at this  time. 04/12/18 on evaluation today patient's wound on the right lateral ankle actually appears to be doing a little bit better with a lot of necrotic docking tissue centrally loosening up in clearing away. However she does have the beginnings of a deep tissue injury on the left lateral malleolus likely due to the fact we've been trying offload the right as much as we have. I think she may benefit from an assistive soft device to help with offloading and it looks like they're looking at one of the doughnut conditions that wraps around the lower leg to offload which I think will definitely do a good job. With that being said I think we definitely need to address this issue on the left before it becomes a wound. Patient is not having significant pain. 04/19/18 on evaluation today patient appears to be doing excellent in regard to the progress she's made with her right lateral ankle ulcer. The left ankle region which did show evidence of a deep tissue injury seems to be resolving there's little fluid noted underneath and a blister there's nothing open at this point in time overall I feel like this is progressing nicely which is good news. She does not seem to be having significant discomfort at this point which is also good news. 04/25/18-She is here in follow up evaluation for bilateral lateral malleolar ulcers. The right lateral malleolus ulcer with pale subcutaneous tissue exposure, central area of ulcer with tendon/periosteum exposed. The left lateral malleolus ulcer now with central area of nonviable tissue, otherwise deep tissue injury. She is wearing compression wraps to the left lower extremity, she will place the right lower extremity compression wraps on when she gets home. She will be out of town over the weekend and return next week and follow-up appointment. She completed her doxycycline this morning 05/03/18 on evaluation today patient appears to be doing very well in regard to her right  lateral ankle ulcer in general. At least she's showing some signs of improvement in this regard. Unfortunately she has some additional injury to the left lateral malleolus region which appears to be new likely even over the past several days. Again this determination is based on the overall appearance. With that being said the patient is obviously frustrated about this currently. 05/10/18-She is here in follow-up evaluation for bilateral lateral malleolar ulcers. She states she has purchased offloading shoes/boots and they will arrive tomorrow. She was asked to bring them in the office at next week's appointment so her provider is aware of product being utilized. She continues to sleep on right or left side, she has been encouraged to sleep on her back. The right lateral malleolus ulcer is precariously close to peri-osteum; will order xray. The left lateral malleolus ulcer is improved. Will switch back to santyl; she will follow up next week. 05/17/18 on evaluation today patient actually appears to be doing very well in regard to her malleolus her ulcers compared to last time I saw them. She does not seem to have as much in the way of contusion at this point which is great news. With that being said she does continue to have discomfort and I do believe that she is still continuing to benefit from the offloading/pressure reducing boots that were recommended. I  think this is the key to trying to get this to heal up completely. 05/24/18 on evaluation today patient actually appears to be doing worse at this point in time unfortunately compared to her last week's evaluation. She is having really no increased pain which is good news unfortunately she does have more maceration in your theme and noted surrounding the right lateral ankle the left lateral ankle is not really is erythematous I do not see signs of the overt cellulitis on that side. Unfortunately the wounds do not seem to have shown any signs of  improvement since the last evaluation. She also has significant swelling especially on the right compared to previous some of this may be due to infection however also think that she may be served better while she has these wounds by compression wrapping versus continuing to use the Juxta-Lite for the time being. Especially with the amount of drainage that she is experiencing at this point. No fevers, chills, nausea, or vomiting noted at this time. 05/31/18 on evaluation today patient appears to actually be doing better in regard to her right lateral lower extremity ulcer specifically on the malleolus region. She has been tolerating the antibiotic without complication. With that being said she still continues to have issues but a little bit of redness although nothing like she what she was experiencing previous. She still continues to pressure to her ankle area she did get the problem on offloading boots unfortunately she will not wear them she states there too uncomfortable and she can't get in and out of the bed. Nonetheless at this point her wounds seem to be continually getting worse which is not what we want I'm getting somewhat concerned about her progress and how things are going to proceed if we do not intervene in some way shape or form. I therefore had a very lengthy conversation today about offloading yet again and even made a specific suggestion for switching her to a memory foam mattress and even gave the information for a specific one that they could look at getting if it was something that they were interested in considering. She does not want to be considered for a hospital bed air mattress although honestly insurance would not cover it that she does not have any wounds on her trunk. 06/14/18 on evaluation today both wounds over the bilateral lateral malleolus her ulcers appear to be doing better there's no evidence of pressure injury at this point. She did get the foam mattress for her  bed and this does seem to have been extremely beneficial for her in my pinion. Her daughter states that she is having difficulty getting out of bed because of how soft it is. The patient also relates this to be. Nonetheless I do feel like she's actually doing better. Unfortunately right after and around the time she was getting the mattress she also sustained a fall when she got up to go pick up the phone and ended up injuring her right elbow she has 18 sutures in place. We are not caring for this currently although home health is going to be taking the sutures out shortly. Nonetheless this may be something that we need to evaluate going forward. It depends on how well it has or has not healed in the end. She also recently saw an orthopedic specialist for an injection in the right shoulder just before her fall unfortunately the fall seems to have worsened her pain. 06/21/18 on evaluation today patient appears to be doing about  the same in regard to her lateral malleolus ulcers. Both appear to be just a little bit deeper but again we are clinging away the necrotic and dead tissue which I think is why this is progressing towards a deeper realm as opposed [name], [name]. ([id number]) to improving from my measurement standpoint in that regard. Nonetheless she has been tolerating the dressing changes she absolutely hates the memory foam mattress topper that was obtained for her nonetheless I do believe this is still doing excellent as far as taking care of excess pressure in regard to the lateral malleolus regions. She in fact has no pressure injury that I see whereas in weeks past it was week by week I was constantly seeing new pressure injuries. Overall I think it has been very beneficial for her. 07/03/18; patient arrives in my clinic today. She has deep punched out areas over her bilateral lateral malleoli. The area on the right has some more depth. We spent a lot of time today talking about pressure  relief for these areas. This started when her daughter asked for a prescription for a memory foam mattress. I have never written a prescription for a mattress and I don't think insurances would pay for that on an ordinary bed. In any case he came up that she has foam boots that she refuses to wear. I would suggest going to these before any other offloading issues when she is in bed. They say she is meticulous about offloading this the rest of the day 07/10/18- She is seen in follow-up evaluation for bilateral, lateral malleolus ulcers. There is no improvement in the ulcers. She has purchased and is sleeping on a memory foam mattress/overlay, she has been using the offloading boots nightly over the past week. She has a follow up appointment with vascular medicine at the end of October, in my opinion this follow up should be expedited given her deterioration and suboptimal TBI results. We will order plain film xray of the left ankle as deeper structures are palpable; would consider having MRI, regardless of xray report(s). The ulcers will be treated with iodoflex/iodosorb, she is unable to safely change the dressings daily with santyl. 07/19/18 on evaluation today patient appears to be doing in general visually well in regard to her bilateral lateral malleolus ulcers. She has been tolerating the dressing changes without complication which is good news. With that being said we did have an x-ray performed on 07/12/18 which revealed a slight loosen see in the lateral portion of the distal left fibula which may represent artifact but underline lytic destruction or osteomyelitis could not be excluded. MRI was recommended. With that being said we can see about getting the patient scheduled for an MRI to further evaluate this area. In fact we have that scheduled currently for August 20 19,019. 07/26/18 on evaluation today patient's wound on the right lateral ankle actually appears to be doing fairly well at this  point in my pinion. She has made some good progress currently. With that being said unfortunately in regard to the left lateral ankle ulcer this seems to be a little bit more problematic at this time. In fact as I further evaluated the situation she actually had bone exposed which is the first time that's been the case in the bone appear to be necrotic. Currently I did review patient's note from Dr. Bunnie Domino office with [facility]. He stated that ABI was 1.26 on the right and 0.95 on the left with good  waveforms. Her perfusion is stable not reduced from previous studies and her digital waveforms were pretty good particularly on the right. His conclusion upon review of the note was that there was not much she could do to improve her perfusion and he felt she was adequate for wound healing. His suggestion was that she continued to see [place] and consider a synthetic skin graft if there was no underlying infection. He plans to see her back in six months or as needed. 08/01/18 on evaluation today patient appears to be doing better in regard to her right lateral ankle ulcer. Her left lateral ankle ulcer is about the same she still has bone involvement in evidence of necrosis. There does not appear to be evidence of infection at this time On the right lateral lower extremity. I have started her on the Augmentin she picked this up and started this yesterday. This is to get her through until she sees infectious disease which is scheduled for 08/12/18. 08/06/18 on evaluation today patient appears to be doing rather well considering my discussion with patient's daughter at the end of last week. The area which was marked where she had erythema seems to be improved and this is good news. With that being said overall the patient seems to be making good improvement when it comes to the overall appearance of the right lateral ankle ulcer although this has been slow she at least is coming around in this  regard. Unfortunately in regard to the left lateral ankle ulcer this is osteomyelitis based on the pathology report as well is bone culture. Nonetheless we are still waiting CT scan. Unfortunately the MRI we originally ordered cannot be performed as the patient is a pacemaker which I had overlooked. Nonetheless we are working on the CT scan approval and scheduling as of now. She did go to the hospital over the weekend and was placed on IV Cefzo for a couple of days. Fortunately this seems to have improved the erythema quite significantly which is good news. There does not appear to be any evidence of worsening infection at this time. She did have some bleeding after the last debridement therefore I did not perform any sharp debridement in regard to left lateral ankle at this point. Patient has been approved for a snap vac for the right lateral ankle. 08/14/18; the patient with wounds over her bilateral lateral malleoli. The area on the right actually looks quite good. Been using a snap back on this area. Healthy granulation and appears to be filling in. Unfortunately the area on the left is really problematic. 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 Also noted to have cellulitis. She saw Dr. Novella Olive of infectious disease today and was put on linezolid. We are able to verify this with her pharmacy. She is completed the Augmentin that she was already on. We've been using Iodoflex to this area 08/23/18 on evaluation today patient's wounds both actually appear to be doing better compared to my prior evaluations. Fortunately she showing signs of good improvement in regard to the overall wound status especially where were using the snap vac on the right. In regard to left lateral malleolus the wound bed actually appears to be much cleaner than previously noted. I do not feel any phone directly probed during evaluation today  and though there is tendon noted this does not appear to be necrotic it's actually fairly good as far as  the overall appearance of the tendon is concerned. In general the wound bed actually appears to be doing significantly better than it was previous. Patient is currently in the care of [name] and I did review that note today. He actually has her on two weeks of linezolid and then following the patient will be on 1-2 months of Keflex. That is the plan currently. She has been on antibiotics therapy as prescribed by myself initially starting on July 30, 2018 and has been on that continuously up to this point. 08/30/18 on evaluation today patient actually appears to be doing much better in regard to her right lateral malleolus ulcer. She has been tolerating the dressing changes specifically the snap vac without complication although she did have some issues with the seal currently. Apparently there was some trouble with getting it to maintain over the past week past Sunday. Nonetheless overall the wound appears better in regard to the right lateral malleolus region. In regard to left lateral malleolus this actually show some signs of additional granulation although there still tendon noted in the base of the wound this appears to be healthy not necrotic in any way whatsoever. We are considering potentially using a snap vac for the left lateral malleolus as well the product wrap from [employer], [place], was present in the clinic today we're going to see this patient I did have her come in with me after obtaining consent from the patient and her daughter in order to look at the wound and see if there's any recommendation one way or another as to whether or not they felt the snapback could be beneficial for the left lateral malleolus region. But the conclusion was that it might be but that this is definitely a little bit deeper wound than what traditionally would be utilized for a snap vac. 09/06/18 on  evaluation today patient actually appears to be doing excellent in my pinion in regard to both ankle ulcers. She has been tolerating the dressing changes without complication which is great news. Specifically we have been using the snap vac. In regard to the right ankle I'm not even sure that this is going to be necessary for today and following as the wound has filled in quite nicely. In regard to the left ankle I do believe [name], [name] ([id number]) that we're seeing excellent epithelialization from the edge as well as granulation in the central portion the tendon is still exposed but there's no evidence of necrotic bone and in general I feel like the patient has made excellent progress even compared to last week with just one week of the snap vac. 09/11/18; this is a patient who has wounds on her bilateral lateral malleoli. Initially both of these were deep stage IV wounds in the setting of chronic arterial insufficiency. She has been revascularized. As I understand think she been using snap vacs to both of these wounds however the area on the right became more superficial and currently she is only using it on the left. Using silver collagen on the right and silver collagen under the back on the left I believe 09/19/18 on evaluation today patient actually appears to be doing very well in regard to her lateral malleolus or ulcers bilaterally. She has been tolerating the dressing changes without complication. Fortunately there does not appear to be any evidence of infection at this time. Overall I feel like she is improving in an excellent manner and I'm very pleased with the fact that everything seems to  be turning towards the better for her. This has obviously been a long road. 09/27/18 on evaluation today patient actually appears to be doing very well in regard to her bilateral lateral malleolus ulcers. She has been tolerating the dressing changes without complication. Fortunately there does  not appear to be any evidence of infection at this time which is also great news. No fevers, chills, nausea, or vomiting noted at this time. Overall I feel like she is doing excellent with the snap vac on the left malleolus. She had 40 mL of fluid collection over the past week. 10/04/18 on evaluation today patient actually appears to be doing well in regard to her bilateral lateral malleolus ulcers. She continues to tolerate the dressing changes without complication. One issue that I see is the snap vac on the left lateral malleolus which appears to have sealed off some fluid underlying this area and has not really allowed it to heal to the degree that I would like to see. For that reason I did suggest at this point we may want to pack a small piece of packing strip into this region to allow it to more effectively wick out fluid. 10/11/18 in general the patient today does not feel that she has been doing very well. She's been a little bit lethargic and subsequently is having bodyaches as well according to what she tells me today. With that being said overall she has been concerned with the fact that something may be worsening although to be honest her wounds really have not been appearing poorly. She does have a new ulcer on her left heel unfortunately. This may be pressure related. Nonetheless it seems to me to have potentially started at least as a blister I do not see any evidence of deep tissue injury. In regard to the left ankle the snap vac still seems to be causing the ceiling off of the deeper part of the wound which is in turn trapping fluid. I'm not extremely pleased with the overall appearance as far as progress from last week to this week therefore I'm gonna discontinue the snap vac at this point. 10/18/18 patient unfortunately this point has not been feeling well for the past several days. She was seen by Grayland Ormond her primary care provider who is a Librarian, academic at [facility]. Subsequently she states that she's been very weak and generally feeling malaise. No fevers, chills, nausea, or vomiting noted at this time. With that being said bloodwork was performed at the PCP office on the 11th of this month which showed a white blood cell count of 10.7. This was repeated today and shows a white blood cell count of 12.4. This does show signs of worsening. Coupled with the fact that she is feeling worse and that her left ankle wound is not really showing signs of improvement I feel like this is an indication that the osteomyelitis is likely exacerbating not improving. Overall I think we may also want to check her C-reactive protein and sedimentation rate. Actually did call [facility] office this afternoon while the patient was in the office here with me. Subsequently based on the findings we discussed treatment possibilities and I think that it is appropriate for [place] to go ahead and initiate treatment with doxycycline which I'm going to do. Subsequently he did agree to see about adding a CRP and sedimentation rate to her orders. If that has not already been drawn to where they can run it they will contact the  patient she can come back to have that check. They are in agreement with plan as far as the patient and her daughter are concerned. Nonetheless also think we need to get in touch with Dr. Henreitta Leber office to see about getting the patient scheduled with him as soon as possible. 11/08/18 on evaluation today patient presents for follow-up concerning her bilateral foot and ankle ulcers. I did do an extensive review of her chart in epic today. Subsequently she was seen by [name] he did initiate Cefepime IV antibiotic therapy. Subsequently she had some issues with her PICC line this had to be removed because it was coiled and then replaced. Fortunately that was now settled. Unfortunately she has continued have issues with her left heel as well as the issues that she is  experiencing with her bilateral lateral malleolus regions. I do believe however both areas seem to be doing a little bit better on evaluation today which is good news. No fevers, chills, nausea, or vomiting noted at this time. She actually has an angiogram schedule with Dr. dew on this coming Monday, November 11, 2018. Subsequently the patient states that she is feeling much better especially than what she was roughly 2 weeks ago. She actually had to cancel an appointment because she was feeling so poorly. No fevers, chills, nausea, or vomiting noted at this time. 11/15/18 on evaluation today patient actually is status post having had her angiogram with Dr. dew Monday, four days ago. 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 He had to go and work on several areas of the vasculature fortunately he was able to obtain no more than a 30% residual stenosis throughout post procedure. I reviewed this note today. I think this will definitely help with healing at this time. Fortunately there does not appear to be any signs of infection and I do feel like ratio already has a better appearance to it. 11/22/18 upon evaluation today patient actually appears to be doing very well in regard to her wounds in general. The right lateral malleolus looks excellent the heel looks better in the left lateral malleolus also appears to be doing a little better. With that being said the right second toe actually appears to be open and training we been watching this is been dry and stable but now is open. 12/03/2018 Seen today for follow-up and management of multiple bilateral lower extremity wounds. New pressure injury of the great toe which is closed at this time. Wound of the right distal second toe appears larger today with deep undermining and a pocket of fluid present within the undermining region. Left and right malleolus is wounds are stable today with no signs and symptoms of infection.Denies  any needs or concerns during exam today. 12/13/18 on evaluation today patient appears to be doing somewhat better in regard to her left heel ulcer. She also seems to be completely healed in regard to the right lateral malleolus ulcer. The left malleolus ulcer is smaller what unfortunately the wounds which are new over the first and second toes of the right foot are what are most concerning at this point especially the second. Both areas did require sharp debridement today. 12/20/18 on evaluation today patient's wound actually appears to be doing better in regard to left lateral ankle and her right lateral ankle continues to remain healed. The hill ulcer on the left is improved. She does have improvement noted as well in regard to both toe ulcers.  Overall I'm very pleased in this regard. No fevers, chills, nausea, or vomiting noted at this time. 12/23/18 on evaluation today patient is seen after she had her toenails trimmed at the podiatrist office due to issues with her right great toe. There was what appeared to be dark eschar on the surface of the wound which had her in the podiatrist concerned. Nonetheless as I remember that during the last office visit I had utilize silver nitrate of this area I was much less concerned about the situation. Subsequently I was able to clean off much of this tissue without any complication today. This does not appear to show any signs of infection and actually look somewhat better Lolli, [name] ([id number]) compared to last time post debridement. Her second toe on the right foot actually had callous over and there did appear still be some fluid underneath this that would require debridement today. 12/27/18 on evaluation today patient actually appears to be showing signs of improvement at all locations. Even the left lateral ankle although this is not quite as great as the other sites. Fortunately there does not appear to be any signs of infection at this time and  both of her toes on the right foot seem to be showing signs of improvement which is good news and very pleased in this regard. 01/03/19 on evaluation today patient appears to be doing better for the most part in regard to her wounds in particular. There does not appear to be any evidence of infection at this time which is good news. Fortunately there is no sign of really worsening anywhere except for the right great toe which she does have what appears to be a bruise/deep tissue injury which is very superficial and already resolving. I'm not sure where this came from I questioned her extensively and she does not recall what may have happened with this. Other than that the patient seems to be doing well even the left lateral ankle ulcer looks good and is getting smaller. 01/10/19 on evaluation today patient appears to be doing well in regard to her left heel wound and both of her toe wounds. Overall I feel like there is definitely improvement here and I'm happy in that regard. With that being said unfortunately she is having issues with the left lateral malleolus ulcer which unfortunately still has a lot of depth to it. This is gonna be a very difficult wound for [place] to be able to truly get to heal. I may want to consider some type of skin substitute to see if this would be of benefit for her. I'll discuss this with her more the next visit most likely. This was something I thought about more at the end of the visit when I was Artie out of the room and the patient had been discharged. 01/17/19 on evaluation today patient appears to be doing very well in regard to her wounds in general. She's been making excellent progress at this time. Fortunately there's no sign of infection at this time either. No fevers, chills, nausea, or vomiting noted at this time. The biggest issue is still her left lateral malleolus where it appears to be doing well and is getting smaller but still shows a small corner where this is  deeper and goes down into what appears to be the joint space. Nonetheless this is taking much longer to heal although it still looks better in smaller than previous evaluations. 01/24/19 on evaluation today patient's wounds actually appear to be doing  rather well in general overall. She did require some sharp debridement in regard to the right great toe but everything else appears to be doing excellent no debridement was even necessary. No fevers, chills, nausea, or vomiting noted at this time. 01/31/19 on evaluation today patient actually appears to be doing much better in regard to her left foot wound on the heel as well as the ankle. The right great toe appears to be a little bit worse today this had callous over and trapped a lot of fluid underneath. Fortunately there's no signs of infection at any site which is great news. 02/07/19 on evaluation today patient actually appears to be doing decently well in regard to all of her ulcers at this point. No sharp debridement was required she is a little bit of hyper granulation in regard to the left lateral ankle as well as the left heel but the hill itself is almost completely healed which is excellent news. Overall been very pleased in this regard. 02/14/19 on evaluation today patient actually appears to be doing very well in regard to her ulcers on the right first toe, left lateral malleolus, and left heel. In fact the heel is almost completely healed at this point. The patient does not show any signs of infection which is good news. Overall very pleased with how things have progressed. 04/18/19 Telehealth Evaluation During the COVID-19 National Emergency: Verbal Consent: Obtained from patient Allergies: reviewed and the active list is current. Medication changes: patient has no current medication changes. COVID-19 Screening: 1. Have you traveled internationally or on a cruise ship in the last 14 dayso No 2. Have you had contact with someone with or  under investigation for COVID-19o No 3. Have you had a fever, cough, sore throat, or experiencing shortness of breatho No on evaluation today actually did have a visit with this patient through a telehealth encounter with her home health nurse. Subsequently it was noted that the patient actually appears to be doing okay in regard to her wounds both the right great toe as well as the left lateral malleolus have shown signs of improvement although this in your theme around the left lateral malleolus there eschar coverings for both locations. The question is whether or not they are actually close and whether or not home health needs to discharge the patient or not. Nonetheless my concern is this point obviously is that without actually seeing her and being able to evaluate this directly I cannot ensure that she is completely healed which is the question that I'm being asked. 04/22/19 on evaluation today patient presents for her first evaluation since last time I saw her which was actually February 14, 2019. I did do a telehealth visit last week in which point it was questionable whether or not she may be healed and had to bring her in today for confirmation. With that being said she does seem to be doing quite well at this point which is good news. There does not appear to be any drainage in the deed I believe her wounds may be healed. Readmission: 09/04/2019 on evaluation today patient appears to be doing unfortunately somewhat more poorly in regard to her left foot ulcer secondary to a wound that began on 08/21/2019 at least when she first noticed this. Fortunately she has not had any evidence of active infection at this time. Systemically. I also do not necessarily see any evidence of infection at the blister/wound site on the first metatarsal head plantar aspect. This almost  appears to be something that may have just rubbed inappropriately causing this to breakdown. They did not want a wait too long to  come in to be seen as again she had significant issues in the past with wounds that took quite a while to heal in fact it was close to 2 years. Nonetheless this does not appear to be quite that bad but again we do need to remove some of the necrotic tissue from the surface of the wound to tell exactly the extent. She does not appear to have any significant arterial disease at this point and again her last ABIs and TBI's are recorded above in the alert section her left ABI was 1.27 with a TBI of 0.72 to the right ABI 1.08 with a TBI of 0.39. Other than this the patient has been doing quite well since I last saw her and that was in May 2020. 09/11/2019 on evaluation today patient appeared to be doing very well with regard to her plantar foot ulcer on the left. In fact this appears to be almost completely healed which is awesome. That is after just 1 week of intervention. With that being said there is no signs of active infection at this time. 09/18/2019 on evaluation today patient actually appears to be doing excellent in fact she is completely healed based on what I am seeing at this [name], [name]. ([id number]) point. Fortunately there is no signs of active infection at this time and overall patient is very pleased to hear that this area has healed so quickly. Readmission: 05/13/2021 upon evaluation today this patient presents for reevaluation here in the clinic. This is a wound that actually we previously took care of. She had 1 on the right ankle and the left the left turned out to be be harder due to to heal but nonetheless is doing great at this point as the right that has reopened and it was noted first just several weeks ago with a scab over it and came off in just the past few days. Fortunately there does not appear to be any obvious evidence of significant active infection at this time which is great news. No fevers, chills, nausea, vomiting, or diarrhea. The patient does have a history of  pacemaker along with being on Eliquis currently as well. There does not appear to be any signs of this interfering in any way with her wound. She does have swelling we previously had compression socks for her ordered but again it does not look like she wears these on a regular basis by any means. 05/26/2021 upon evaluation today patient appears to be doing well with regard to her wound which is actually showing signs of excellent improvement. There does not appear to be any signs of active infection which is great news and overall very pleased with where things stand today. No fevers, chills, nausea, vomiting, or diarrhea. 06/02/2021 upon evaluation today patient's wound actually showing signs of excellent improvement. Fortunately there does not appear to be any signs of active infection which is great news. I think the patient is making good progress with regard to her wounds in general. 06/09/2021 upon evaluation today patient appears to be doing excellent in regard to her wounds currently. Fortunately there does not appear to be any signs of active infection which is great news. No fevers, chills, nausea, vomiting, or diarrhea. Overall extremely pleased with where things stand today. I think the patient is making excellent progress. 06/16/2021 upon evaluation today patient appears  to be doing well in regard to her wound. This is going require little bit of debridement today and that was discussed with the patient. Otherwise she seems to be doing quite well and I am actually very pleased with where things stand at this point. No fevers, chills, nausea, vomiting, or diarrhea. 06/23/2021 upon evaluation today patient appears to be doing well with regard to her wounds. She has been tolerating the dressing changes without complication. Fortunately there does not appear to be any evidence of infection and she has not had air in her home which she actually lives at an assisted living that got fixed this  morning. With that being said because of that her wrap has been extremely hot and bothersome for her over the past week. 06/30/2021 upon evaluation today patient is actually making excellent progress in regard to her ankle ulcer. She has been tolerating the dressing changes without complication and overall extremely pleased with where things stand there does not appear to be any evidence of active infection which is great news. No fevers, chills, nausea, vomiting, or diarrhea. 07/07/21 upon evaluation today patients and culture on the right actually appears to be doing quite well. There does not appear to be any signs of infection and overall very pleased with where things stand today. No fevers, chills, nausea, or vomiting noted at this time. 07/14/2021 unfortunately the patient today has some evidence of deep tissue injury and pressure getting to the ankle region. Again I am not exactly sure what is going on here but this is very similar to issues that we have had in the past. I explained to the patient that she needs to be very mindful of exactly what is happening I think sleeping in bed is probably the main issue here although there could be other culprits I am not sure what else would potentially lead to this kind of a problem for her. Electronic Signature(s) Signed: 07/14/2021 1:20:06 PM By: Worthy Keeler PA-C Entered By: Worthy Keeler on 07/14/2021 13:20:06 Daman, [name] ([id number]) -------------------------------------------------------------------------------- Physical Exam Details Patient Name: [name] Date of Service: 07/14/2021 11:00 AM Medical Record Number: [id number] Patient Account Number: [id number] Date of Birth/Sex: Apr 06, 1923 (87 y.o. F) Treating RN: Carlene Coria Primary Care Provider: Adrian Prows Other Clinician: Referring Provider: Adrian Prows Treating Provider/Extender: Skipper Cliche in Treatment: 8 Constitutional Well-nourished and  well-hydrated in no acute distress. Respiratory normal breathing without difficulty. Psychiatric this patient is able to make decisions and demonstrates good insight into disease process. Alert and Oriented x 3. pleasant and cooperative. Notes Upon inspection patient did have some enlargement of the wound including deep tissue injury and bruising which led to some necrotic tissue around the edge and surface of the wound which did require sharp debridement today as well. Fortunately I was able to debride this away and the patient tolerated this today without complication postdebridement the wound bed appears to be doing much better. Electronic Signature(s) Signed: 07/14/2021 1:20:28 PM By: Worthy Keeler PA-C Entered By: Worthy Keeler on 07/14/2021 13:20:28 [name] ([id number]) -------------------------------------------------------------------------------- Physician Orders Details Patient Name: [name] Date of Service: 07/14/2021 11:00 AM Medical Record Number: [id number] Patient Account Number: [id number] Date of Birth/Sex: 05/21/1923 (87 y.o. F) Treating RN: Carlene Coria Primary Care Provider: Adrian Prows Other Clinician: Referring Provider: Adrian Prows Treating Provider/Extender: Skipper Cliche in Treatment: 8 Verbal / Phone Orders: No Diagnosis Coding Follow-up Appointments o Return Appointment in 1 week. o Nurse Visit  as needed Midwife Hygiene o May shower with wound dressing protected with water repellent cover or cast protector. Edema Control - Lymphedema / Segmental Compressive Device / Other o Optional: One layer of unna paste to top of compression wrap (to act as an anchor). o Elevate, Exercise Daily and Avoid Standing for Long Periods of Time. o Elevate legs to the level of the heart and pump ankles as often as possible o Elevate leg(s) parallel to the floor when sitting. Wound Treatment Wound #7 - Malleolus  Wound Laterality: Right, Lateral Cleanser: Soap and Water 1 x Per Week/30 Days Discharge Instructions: Gently cleanse wound with antibacterial soap, rinse and pat dry prior to dressing wounds Primary Dressing: Prisma 4.34 (in) 1 x Per Week/30 Days Discharge Instructions: Moisten w/normal saline or sterile water; Cover wound as directed. Do not remove from wound bed. Primary Dressing: Curad Oil Emulsion Dressing 3x3 (in/in) 1 x Per Week/30 Days Discharge Instructions: over prisma Secondary Dressing: ABD Pad 5x9 (in/in) 1 x Per Week/30 Days Discharge Instructions: Cover with ABD pad Compression Wrap: Profore Lite LF 3 Multilayer Compression Bandaging System 1 x Per Week/30 Days Discharge Instructions: Apply 3 multi-layer wrap as prescribed. Electronic Signature(s) Signed: 07/14/2021 5:38:02 PM By: Worthy Keeler PA-C Signed: 07/14/2021 8:40:56 PM By: Carlene Coria RN Entered By: Carlene Coria on 07/14/2021 12:54:35 [name], [name] ([id number]) -------------------------------------------------------------------------------- Problem List Details Patient Name: [name] Date of Service: 07/14/2021 11:00 AM Medical Record Number: [id number] Patient Account Number: [id number] Date of Birth/Sex: Oct 20, 1923 (87 y.o. F) Treating RN: Carlene Coria Primary Care Provider: Adrian Prows Other Clinician: Referring Provider: Adrian Prows Treating Provider/Extender: Skipper Cliche in Treatment: 8 Active Problems ICD-10 Encounter Code Description Active Date MDM Diagnosis 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, stage 3 05/13/2021 No Yes I89.0 Lymphedema, not elsewhere classified 05/13/2021 No Yes I87.2 Venous insufficiency (chronic) (peripheral) 05/13/2021 No Yes Z95.0 Presence of cardiac pacemaker 05/13/2021 No Yes Z79.01 Long term (current) use of anticoagulants 05/13/2021 No Yes Inactive Problems Resolved Problems Electronic Signature(s) Signed: 07/14/2021 12:58:51 PM By: Worthy Keeler PA-C Entered By: Worthy Keeler on 07/14/2021 12:58:51 [name], [name] ([id number]) -------------------------------------------------------------------------------- Progress Note Details Patient Name: [name] Date of Service: 07/14/2021 11:00 AM Medical Record Number: [id number] Patient Account Number: [id number] Date of Birth/Sex: 09-Feb-1923 (87 y.o. 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 Dr. Amalia Hailey the podiatrist saw her on 07/20/2017 and sent her to the wound center for possible hyperbaric oxygen therapy. past medical history of peripheral vascular disease, varicose veins, status post appendectomy, basal cell carcinoma excision from the left leg, cholecystectomy, pacemaker placement, right lower extremity angiography done by Dr. dew in March 2017 with placement of a stent. there is also note of a successful ablation of the right small saphenous vein done which was reviewed by ultrasound on 10/24/2016. the patient had a right small saphenous vein ablation done on 10/20/2016. The patient has never been a smoker. She has been seen by Dr. Corene Cornea dew the vascular surgeon who most recently saw her on 06/15/2017 for evaluation of ongoing problems with right leg swelling. She had a lower extremity arterial  duplex examination done(02/13/17) which showed patent distal right superficial femoral artery stent and above-the-knee popliteal stent without evidence of restenosis. The ABI was more than 1.3 on the right and more than 1.3  on the left. This was consistent with noncompressible arteries due to medial calcification. The right great toe pressure and PPG waveforms are within normal limits and the left great toe pressure and PPG waveforms are decreased. he recommended she continue to wear her compression stockings and continue with elevation. She is scheduled to have a noninvasive arterial study in the near future 08/16/2017 -- had a lower extremity arterial duplex examination done which showed patent distal right superficial femoral artery stent and above-the-knee popliteal stent without evidence of restenosis. The ABI was more than 1.3 on the right and more than 1.3 on the left. This was consistent with noncompressible arteries due to medial calcification. The right great toe pressure and PPG waveforms are within normal limits and the left great toe pressure and PPG waveforms are decreased. the x-ray of the right ankle has not yet been done 08/24/2017 -- had a right ankle x-ray -- IMPRESSION:1. No fracture, bone lesion or evidence of osteomyelitis. 2. Lateral soft tissue swelling with a soft tissue ulcer. she has not yet seen the vascular surgeon for review 08/31/17 on evaluation today patient's wound appears to be showing signs of improvement. She still with her appointment with vascular in order to review her results of her vascular study and then determine if any intervention would be recommended at that time. No fevers, chills, nausea, or vomiting noted at this time. She has been tolerating the dressing changes without complication. 09/28/17 on evaluation today patient's wound appears to show signs of good improvement in regard to the granulation tissue which is surfacing. There is still a layer of slough covering the wound and the posterior portion is still significantly deeper than the anterior nonetheless there has been some good sign of things moving towards the better. She is going to go back to Dr. dew for  reevaluation to ensure her blood flow is still appropriate. That will be before her next evaluation with [place] next week. No fevers, chills, nausea, or vomiting noted at this time. Patient does have some discomfort rated to be a 3-4/10 depending on activity specifically cleansing the wound makes it worse. 10/05/2017 -- the patient was seen by Dr. Lucky Cowboy last week and noninvasive studies showed a normal right ABI with brisk triphasic waveforms consistent with no arterial insufficiency including normal digital pressures. The duplex showed a patent distal right SFA stent and the proximal SFA was also normal. He was pleased with her test and thought she should have enough of perfusion for normal wound healing. He would see her back in 6 months time. 12/21/17 on evaluation today patient appears to be doing fairly well in regard to her right lateral ankle wound. Unfortunately the main issue that she is expansion at this point is that she is having some issues with what appears to be some cellulitis in the right anterior shin. She has also been noting a little bit of uncomfortable feeling especially last night and her ankle area. I'm afraid that she made the developing a little bit of an infection. With that being said I think it is in the early stages. 12/28/17 on evaluation today patient's ankle appears to be doing excellent. She's making good progress at this point the cellulitis seems to have improved after last week's evaluation. Overall she is having no significant discomfort  which is excellent news. She does have an appointment with Dr. dew on March 29, 2018 for reevaluation in regard to the stent he placed. She seems to have excellent blood flow in the right lower extremity. 01/19/12 on evaluation today patient's wound appears to be doing very well. In fact she does not appear to require debridement at this point, there's no evidence of infection, and overall from the standpoint of the wound she seems to be  doing very well. With that being said I believe that it may be time to switch to different dressing away from the [facility] Dressing she tells me she does have a lot going on her friend actually passed away yesterday and she's also having a lot of issues with her husband this obviously is weighing heavy on her as far as your thoughts and concerns today. 01/25/18 on evaluation today patient appears to be doing fairly well in regard to her right lateral malleolus. She has been tolerating the dressing changes without complication. Overall I feel like this is definitely showing signs of improvement as far as how the overall appearance of the wound is there's also evidence of epithelium start to migrate over the granulation tissue. In general I think that she is progressing nicely as far as the wound is concerned. The only concern she really has is whether or not we can switch to every other week visits in order to avoid having as many DAMYAH, YOCHUM ([id number]) appointments as her daughters have a difficult time getting her to her appointments as well as the patient's husband to his he is not doing very well at this point. 02/22/18 on evaluation today patient's right lateral malleolus ulcer appears to be doing great. She has been tolerating the dressing changes without complication. Overall you making excellent progress at this time. Patient is having no significant discomfort. 03/15/18 on evaluation today patient appears to be doing much more poorly in regard to her right lateral ankle ulcer at this point. Unfortunately since have last seen her her husband has passed just a few days ago is obviously weighed heavily on her her daughter also had surgery well she is with her today as usual. There does not appear to be any evidence of infection she does seem to have significant contusion/deep tissue injury to the right lateral malleolus which was not noted previous when I saw her last. It's hard to  tell of exactly when this injury occurred although during the time she was spending the night in the hospital this may have been most likely. 03/22/18 on evaluation today patient appears to actually be doing very well in regard to her ulcer. She did unfortunately have a setback which was noted last week however the good news is we seem to be getting back on track and in fact the wound in the core did still have some necrotic tissue which will be addressed at this point today but in general I'm seeing signs that things are on the up and up. She is glad to hear this obviously she's been somewhat concerned that due to the how her wound digressed more recently. 03/29/18 on evaluation today patient appears to be doing fairly well in regard to her right lower extremity lateral malleolus ulcer. She unfortunately does have a new area of pressure injury over the inferior portion where the wound has opened up a little bit larger secondary to the pressure she seems to be getting. She does tell me sometimes when she sleeps at  night that it actually hurts and does seem to be pushing on the area little bit more unfortunately. There does not appear to be any evidence of infection which is good news. She has been tolerating the dressing changes without complication. She also did have some bruising in the left second and third toes due to the fact that she may have bump this or injured it although she has neuropathy so she does not feel she did move recently that may have been where this came from. Nonetheless there does not appear to be any evidence of infection at this time. 04/12/18 on evaluation today patient's wound on the right lateral ankle actually appears to be doing a little bit better with a lot of necrotic docking tissue centrally loosening up in clearing away. However she does have the beginnings of a deep tissue injury on the left lateral malleolus likely due to the fact we've been trying offload the right  as much as we have. I think she may benefit from an assistive soft device to help with offloading and it looks like they're looking at one of the doughnut conditions that wraps around the lower leg to offload which I think will definitely do a good job. With that being said I think we definitely need to address this issue on the left before it becomes a wound. Patient is not having significant pain. 04/19/18 on evaluation today patient appears to be doing excellent in regard to the progress she's made with her right lateral ankle ulcer. The left ankle region which did show evidence of a deep tissue injury seems to be resolving there's little fluid noted underneath and a blister there's nothing open at this point in time overall I feel like this is progressing nicely which is good news. She does not seem to be having significant discomfort at this point which is also good news. 04/25/18-She is here in follow up evaluation for bilateral lateral malleolar ulcers. The right lateral malleolus ulcer with pale subcutaneous tissue exposure, central area of ulcer with tendon/periosteum exposed. The left lateral malleolus ulcer now with central area of nonviable tissue, otherwise deep tissue injury. She is wearing compression wraps to the left lower extremity, she will place the right lower extremity compression wraps on when she gets home. She will be out of town over the weekend and return next week and follow-up appointment. She completed her doxycycline this morning 05/03/18 on evaluation today patient appears to be doing very well in regard to her right lateral ankle ulcer in general. At least she's showing some signs of improvement in this regard. Unfortunately she has some additional injury to the left lateral malleolus region which appears to be new likely even over the past several days. Again this determination is based on the overall appearance. With that being said the patient is obviously frustrated  about this currently. 05/10/18-She is here in follow-up evaluation for bilateral lateral malleolar ulcers. She states she has purchased offloading shoes/boots and they will arrive tomorrow. She was asked to bring them in the office at next week's appointment so her provider is aware of product being utilized. She continues to sleep on right or left side, she has been encouraged to sleep on her back. The right lateral malleolus ulcer is precariously close to peri-osteum; will order xray. The left lateral malleolus ulcer is improved. Will switch back to santyl; she will follow up next week. 05/17/18 on evaluation today patient actually appears to be doing very well in regard  to her malleolus her ulcers compared to last time I saw them. She does not seem to have as much in the way of contusion at this point which is great news. With that being said she does continue to have discomfort and I do believe that she is still continuing to benefit from the offloading/pressure reducing boots that were recommended. I think this is the key to trying to get this to heal up completely. 05/24/18 on evaluation today patient actually appears to be doing worse at this point in time unfortunately compared to her last week's evaluation. She is having really no increased pain which is good news unfortunately she does have more maceration in your theme and noted surrounding the right lateral ankle the left lateral ankle is not really is erythematous I do not see signs of the overt cellulitis on that side. Unfortunately the wounds do not seem to have shown any signs of improvement since the last evaluation. She also has significant swelling especially on the right compared to previous some of this may be due to infection however also think that she may be served better while she has these wounds by compression wrapping versus continuing to use the Juxta-Lite for the time being. Especially with the amount of drainage that she is  experiencing at this point. No fevers, chills, nausea, or vomiting noted at this time. 05/31/18 on evaluation today patient appears to actually be doing better in regard to her right lateral lower extremity ulcer specifically on the malleolus region. She has been tolerating the antibiotic without complication. With that being said she still continues to have issues but a little bit of redness although nothing like she what she was experiencing previous. She still continues to pressure to her ankle area she did get the problem on offloading boots unfortunately she will not wear them she states there too uncomfortable and she can't get in and out of the bed. Nonetheless at this point her wounds seem to be continually getting worse which is not what we want I'm getting somewhat concerned about her progress and how things are going to proceed if we do not intervene in some way shape or form. I therefore had a very lengthy conversation today about offloading yet again and even made a specific suggestion for switching her to a memory foam mattress and even gave the information for a specific one that they could look at getting if it was something that they were interested in considering. She does not want to be considered for a hospital bed air mattress although honestly insurance would not cover it that she does not have any wounds on her trunk. 06/14/18 on evaluation today both wounds over the bilateral lateral malleolus her ulcers appear to be doing better there's no evidence of pressure injury at this point. She did get the foam mattress for her bed and this does seem to have been extremely beneficial for her in my pinion. Her daughter states that she is having difficulty getting out of bed because of how soft it is. The patient also relates this to be. Nonetheless I do feel like she's actually doing better. Unfortunately right after and around the time she was getting the mattress she also sustained a  fall when she got up to go pick up the phone and ended up injuring her right elbow she has 18 sutures in place. We are not caring for this currently although home health is going to be taking the sutures out shortly. Nonetheless  this may be something that we need to evaluate going forward. It depends on JNIYAH, MATHIOWETZ ([id number]) how well it has or has not healed in the end. She also recently saw an orthopedic specialist for an injection in the right shoulder just before her fall unfortunately the fall seems to have worsened her pain. 06/21/18 on evaluation today patient appears to be doing about the same in regard to her lateral malleolus ulcers. Both appear to be just a little bit deeper but again we are clinging away the necrotic and dead tissue which I think is why this is progressing towards a deeper realm as opposed to improving from my measurement standpoint in that regard. Nonetheless she has been tolerating the dressing changes she absolutely hates the memory foam mattress topper that was obtained for her nonetheless I do believe this is still doing excellent as far as taking care of excess pressure in regard to the lateral malleolus regions. She in fact has no pressure injury that I see whereas in weeks past it was week by week I was constantly seeing new pressure injuries. Overall I think it has been very beneficial for her. 07/03/18; patient arrives in my clinic today. She has deep punched out areas over her bilateral lateral malleoli. The area on the right has some more depth. We spent a lot of time today talking about pressure relief for these areas. This started when her daughter asked for a prescription for a memory foam mattress. I have never written a prescription for a mattress and I don't think insurances would pay for that on an ordinary bed. In any case he came up that she has foam boots that she refuses to wear. I would suggest going to these before any other offloading  issues when she is in bed. They say she is meticulous about offloading this the rest of the day 07/10/18- She is seen in follow-up evaluation for bilateral, lateral malleolus ulcers. There is no improvement in the ulcers. She has purchased and is sleeping on a memory foam mattress/overlay, she has been using the offloading boots nightly over the past week. She has a follow up appointment with vascular medicine at the end of October, in my opinion this follow up should be expedited given her deterioration and suboptimal TBI results. We will order plain film xray of the left ankle as deeper structures are palpable; would consider having MRI, regardless of xray report(s). The ulcers will be treated with iodoflex/iodosorb, she is unable to safely change the dressings daily with santyl. 07/19/18 on evaluation today patient appears to be doing in general visually well in regard to her bilateral lateral malleolus ulcers. She has been tolerating the dressing changes without complication which is good news. With that being said we did have an x-ray performed on 07/12/18 which revealed a slight loosen see in the lateral portion of the distal left fibula which may represent artifact but underline lytic destruction or osteomyelitis could not be excluded. MRI was recommended. With that being said we can see about getting the patient scheduled for an MRI to further evaluate this area. In fact we have that scheduled currently for August 20 19,019. 07/26/18 on evaluation today patient's wound on the right lateral ankle actually appears to be doing fairly well at this point in my pinion. She has made some good progress currently. With that being said unfortunately in regard to the left lateral ankle ulcer this seems to be a little bit more problematic at this time.  In fact as I further evaluated the situation she actually had bone exposed which is the first time that's been the case in the bone appear to be  necrotic. Currently I did review patient's note from Dr. Bunnie Domino office with [facility]. He stated that ABI was 1.26 on the right and 0.95 on the left with good waveforms. Her perfusion is stable not reduced from previous studies and her digital waveforms were pretty good particularly on the right. His conclusion upon review of the note was that there was not much she could do to improve her perfusion and he felt she was adequate for wound healing. His suggestion was that she continued to see [place] and consider a synthetic skin graft if there was no underlying infection. He plans to see her back in six months or as needed. 08/01/18 on evaluation today patient appears to be doing better in regard to her right lateral ankle ulcer. Her left lateral ankle ulcer is about the same she still has bone involvement in evidence of necrosis. There does not appear to be evidence of infection at this time On the right lateral lower extremity. I have started her on the Augmentin she picked this up and started this yesterday. This is to get her through until she sees infectious disease which is scheduled for 08/12/18. 08/06/18 on evaluation today patient appears to be doing rather well considering my discussion with patient's daughter at the end of last week. The area which was marked where she had erythema seems to be improved and this is good news. With that being said overall the patient seems to be making good improvement when it comes to the overall appearance of the right lateral ankle ulcer although this has been slow she at least is coming around in this regard. Unfortunately in regard to the left lateral ankle ulcer this is osteomyelitis based on the pathology report as well is bone culture. Nonetheless we are still waiting CT scan. Unfortunately the MRI we originally ordered cannot be performed as the patient is a pacemaker which I had overlooked. Nonetheless we are working on the CT scan  approval and scheduling as of now. She did go to the hospital over the weekend and was placed on IV Cefzo for a couple of days. Fortunately this seems to have improved the erythema quite significantly which is good news. There does not appear to be any evidence of worsening infection at this time. She did have some bleeding after the last debridement therefore I did not perform any sharp debridement in regard to left lateral ankle at this point. Patient has been approved for a snap vac for the right lateral ankle. 08/14/18; the patient with wounds over her bilateral lateral malleoli. The area on the right actually looks quite good. Been using a snap back on this area. Healthy granulation and appears to be filling in. Unfortunately the area on the left is really problematic. 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 Also noted to have cellulitis. She saw Dr. Novella Olive of infectious disease today and was put on linezolid. We are able to verify this with her pharmacy. She is completed the Augmentin that she was already on. We've been using Iodoflex to this area 08/23/18 on evaluation today patient's wounds both actually appear to be doing better compared to my prior evaluations. Fortunately she showing signs of good improvement in regard to the overall  wound status especially where were using the snap vac on the right. In regard to left lateral malleolus the wound bed actually appears to be much cleaner than previously noted. I do not feel any phone directly probed during evaluation today and though there is tendon noted this does not appear to be necrotic it's actually fairly good as far as the overall appearance of the tendon is concerned. In general the wound bed actually appears to be doing significantly better than it was previous. Patient is currently in the care of [name] and I did review that note today. He actually  has her on two weeks of linezolid and then following the patient will be on 1-2 months of Keflex. That is the plan currently. She has been on antibiotics therapy as prescribed by myself initially starting on July 30, 2018 and has been on that continuously up to this point. 08/30/18 on evaluation today patient actually appears to be doing much better in regard to her right lateral malleolus ulcer. She has been tolerating the dressing changes specifically the snap vac without complication although she did have some issues with the seal currently. Apparently there was some trouble with getting it to maintain over the past week past Sunday. Nonetheless overall the wound appears better in regard to the right lateral malleolus region. In regard to left lateral malleolus this actually show some signs of additional granulation although there still tendon noted in the base of the wound this appears to be healthy not necrotic in any way whatsoever. We are considering potentially using a snap vac for the left lateral malleolus as well the product wrap from [employer], [place], was present in the clinic today we're going to see this patient I did have her come in with me after obtaining consent from the patient and her daughter in order to look at the wound and see if there's any recommendation one way or another as to whether or not they felt the snapback could be beneficial for the left lateral malleolus region. But KYTZIA, HESTERBERG ([id number]) the conclusion was that it might be but that this is definitely a little bit deeper wound than what traditionally would be utilized for a snap vac. 09/06/18 on evaluation today patient actually appears to be doing excellent in my pinion in regard to both ankle ulcers. She has been tolerating the dressing changes without complication which is great news. Specifically we have been using the snap vac. In regard to the right ankle I'm not even sure that this is going to be  necessary for today and following as the wound has filled in quite nicely. In regard to the left ankle I do believe that we're seeing excellent epithelialization from the edge as well as granulation in the central portion the tendon is still exposed but there's no evidence of necrotic bone and in general I feel like the patient has made excellent progress even compared to last week with just one week of the snap vac. 09/11/18; this is a patient who has wounds on her bilateral lateral malleoli. Initially both of these were deep stage IV wounds in the setting of chronic arterial insufficiency. She has been revascularized. As I understand think she been using snap vacs to both of these wounds however the area on the right became more superficial and currently she is only using it on the left. Using silver collagen on the right and silver collagen under the back on the left I believe 09/19/18 on evaluation today  patient actually appears to be doing very well in regard to her lateral malleolus or ulcers bilaterally. She has been tolerating the dressing changes without complication. Fortunately there does not appear to be any evidence of infection at this time. Overall I feel like she is improving in an excellent manner and I'm very pleased with the fact that everything seems to be turning towards the better for her. This has obviously been a long road. 09/27/18 on evaluation today patient actually appears to be doing very well in regard to her bilateral lateral malleolus ulcers. She has been tolerating the dressing changes without complication. Fortunately there does not appear to be any evidence of infection at this time which is also great news. No fevers, chills, nausea, or vomiting noted at this time. Overall I feel like she is doing excellent with the snap vac on the left malleolus. She had 40 mL of fluid collection over the past week. 10/04/18 on evaluation today patient actually appears to be doing  well in regard to her bilateral lateral malleolus ulcers. She continues to tolerate the dressing changes without complication. One issue that I see is the snap vac on the left lateral malleolus which appears to have sealed off some fluid underlying this area and has not really allowed it to heal to the degree that I would like to see. For that reason I did suggest at this point we may want to pack a small piece of packing strip into this region to allow it to more effectively wick out fluid. 10/11/18 in general the patient today does not feel that she has been doing very well. She's been a little bit lethargic and subsequently is having bodyaches as well according to what she tells me today. With that being said overall she has been concerned with the fact that something may be worsening although to be honest her wounds really have not been appearing poorly. She does have a new ulcer on her left heel unfortunately. This may be pressure related. Nonetheless it seems to me to have potentially started at least as a blister I do not see any evidence of deep tissue injury. In regard to the left ankle the snap vac still seems to be causing the ceiling off of the deeper part of the wound which is in turn trapping fluid. I'm not extremely pleased with the overall appearance as far as progress from last week to this week therefore I'm gonna discontinue the snap vac at this point. 10/18/18 patient unfortunately this point has not been feeling well for the past several days. She was seen by Grayland Ormond her primary care provider who is a Librarian, academic at [facility]. Subsequently she states that she's been very weak and generally feeling malaise. No fevers, chills, nausea, or vomiting noted at this time. With that being said bloodwork was performed at the PCP office on the 11th of this month which showed a white blood cell count of 10.7. This was repeated today and shows a white blood cell count of  12.4. This does show signs of worsening. Coupled with the fact that she is feeling worse and that her left ankle wound is not really showing signs of improvement I feel like this is an indication that the osteomyelitis is likely exacerbating not improving. Overall I think we may also want to check her C-reactive protein and sedimentation rate. Actually did call [facility] office this afternoon while the patient was in the office here with me. Subsequently based  on the findings we discussed treatment possibilities and I think that it is appropriate for [place] to go ahead and initiate treatment with doxycycline which I'm going to do. Subsequently he did agree to see about adding a CRP and sedimentation rate to her orders. If that has not already been drawn to where they can run it they will contact the patient she can come back to have that check. They are in agreement with plan as far as the patient and her daughter are concerned. Nonetheless also think we need to get in touch with Dr. Henreitta Leber office to see about getting the patient scheduled with him as soon as possible. 11/08/18 on evaluation today patient presents for follow-up concerning her bilateral foot and ankle ulcers. I did do an extensive review of her chart in epic today. Subsequently she was seen by [name] he did initiate Cefepime IV antibiotic therapy. Subsequently she had some issues with her PICC line this had to be removed because it was coiled and then replaced. Fortunately that was now settled. Unfortunately she has continued have issues with her left heel as well as the issues that she is experiencing with her bilateral lateral malleolus regions. I do believe however both areas seem to be doing a little bit better on evaluation today which is good news. No fevers, chills, nausea, or vomiting noted at this time. She actually has an angiogram schedule with Dr. dew on this coming Monday, November 11, 2018. Subsequently the patient  states that she is feeling much better especially than what she was roughly 2 weeks ago. She actually had to cancel an appointment because she was feeling so poorly. No fevers, chills, nausea, or vomiting noted at this time. 11/15/18 on evaluation today patient actually is status post having had her angiogram with Dr. dew Monday, four days ago. 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 He had to go and work on several areas of the vasculature fortunately he was able to obtain no more than a 30% residual stenosis throughout post procedure. I reviewed this note today. I think this will definitely help with healing at this time. Fortunately there does not appear to be any signs of infection and I do feel like ratio already has a better appearance to it. 11/22/18 upon evaluation today patient actually appears to be doing very well in regard to her wounds in general. The right lateral malleolus looks excellent the heel looks better in the left lateral malleolus also appears to be doing a little better. With that being said the right second toe actually appears to be open and training we been watching this is been dry and stable but now is open. 12/03/2018 Seen today for follow-up and management of multiple bilateral lower extremity wounds. New pressure injury of the great toe which is closed at this time. Wound of the right distal second toe appears larger today with deep undermining and a pocket of fluid present within the undermining region. Left and right malleolus is wounds are stable today with no signs and symptoms of infection.Denies any needs or concerns during exam today. 12/13/18 on evaluation today patient appears to be doing somewhat better in regard to her left heel ulcer. She also seems to be completely healed in regard to the right lateral malleolus ulcer. The left malleolus ulcer is smaller what unfortunately the wounds which are new over the first and second toes  of the right foot are what are most  concerning at this point especially the second. Both areas did require sharp debridement today. 12/20/18 on evaluation today patient's wound actually appears to be doing better in regard to left lateral ankle and her right lateral ankle continues to remain healed. The hill ulcer on the left is improved. She does have improvement noted as well in regard to both toe ulcers. Overall I'm very pleased in this regard. No fevers, chills, nausea, or vomiting noted at this time. [name], [name] ([id number]) 12/23/18 on evaluation today patient is seen after she had her toenails trimmed at the podiatrist office due to issues with her right great toe. There was what appeared to be dark eschar on the surface of the wound which had her in the podiatrist concerned. Nonetheless as I remember that during the last office visit I had utilize silver nitrate of this area I was much less concerned about the situation. Subsequently I was able to clean off much of this tissue without any complication today. This does not appear to show any signs of infection and actually look somewhat better compared to last time post debridement. Her second toe on the right foot actually had callous over and there did appear still be some fluid underneath this that would require debridement today. 12/27/18 on evaluation today patient actually appears to be showing signs of improvement at all locations. Even the left lateral ankle although this is not quite as great as the other sites. Fortunately there does not appear to be any signs of infection at this time and both of her toes on the right foot seem to be showing signs of improvement which is good news and very pleased in this regard. 01/03/19 on evaluation today patient appears to be doing better for the most part in regard to her wounds in particular. There does not appear to be any evidence of infection at this time which is good news. Fortunately  there is no sign of really worsening anywhere except for the right great toe which she does have what appears to be a bruise/deep tissue injury which is very superficial and already resolving. I'm not sure where this came from I questioned her extensively and she does not recall what may have happened with this. Other than that the patient seems to be doing well even the left lateral ankle ulcer looks good and is getting smaller. 01/10/19 on evaluation today patient appears to be doing well in regard to her left heel wound and both of her toe wounds. Overall I feel like there is definitely improvement here and I'm happy in that regard. With that being said unfortunately she is having issues with the left lateral malleolus ulcer which unfortunately still has a lot of depth to it. This is gonna be a very difficult wound for [place] to be able to truly get to heal. I may want to consider some type of skin substitute to see if this would be of benefit for her. I'll discuss this with her more the next visit most likely. This was something I thought about more at the end of the visit when I was Artie out of the room and the patient had been discharged. 01/17/19 on evaluation today patient appears to be doing very well in regard to her wounds in general. She's been making excellent progress at this time. Fortunately there's no sign of infection at this time either. No fevers, chills, nausea, or vomiting noted at this time. The biggest issue is still her left lateral malleolus  where it appears to be doing well and is getting smaller but still shows a small corner where this is deeper and goes down into what appears to be the joint space. Nonetheless this is taking much longer to heal although it still looks better in smaller than previous evaluations. 01/24/19 on evaluation today patient's wounds actually appear to be doing rather well in general overall. She did require some sharp debridement in regard to the right  great toe but everything else appears to be doing excellent no debridement was even necessary. No fevers, chills, nausea, or vomiting noted at this time. 01/31/19 on evaluation today patient actually appears to be doing much better in regard to her left foot wound on the heel as well as the ankle. The right great toe appears to be a little bit worse today this had callous over and trapped a lot of fluid underneath. Fortunately there's no signs of infection at any site which is great news. 02/07/19 on evaluation today patient actually appears to be doing decently well in regard to all of her ulcers at this point. No sharp debridement was required she is a little bit of hyper granulation in regard to the left lateral ankle as well as the left heel but the hill itself is almost completely healed which is excellent news. Overall been very pleased in this regard. 02/14/19 on evaluation today patient actually appears to be doing very well in regard to her ulcers on the right first toe, left lateral malleolus, and left heel. In fact the heel is almost completely healed at this point. The patient does not show any signs of infection which is good news. Overall very pleased with how things have progressed. 04/18/19 Telehealth Evaluation During the COVID-19 National Emergency: Verbal Consent: Obtained from patient Allergies: reviewed and the active list is current. Medication changes: patient has no current medication changes. COVID-19 Screening: 1. Have you traveled internationally or on a cruise ship in the last 14 dayso No 2. Have you had contact with someone with or under investigation for COVID-19o No 3. Have you had a fever, cough, sore throat, or experiencing shortness of breatho No on evaluation today actually did have a visit with this patient through a telehealth encounter with her home health nurse. Subsequently it was noted that the patient actually appears to be doing okay in regard to her wounds  both the right great toe as well as the left lateral malleolus have shown signs of improvement although this in your theme around the left lateral malleolus there eschar coverings for both locations. The question is whether or not they are actually close and whether or not home health needs to discharge the patient or not. Nonetheless my concern is this point obviously is that without actually seeing her and being able to evaluate this directly I cannot ensure that she is completely healed which is the question that I'm being asked. 04/22/19 on evaluation today patient presents for her first evaluation since last time I saw her which was actually February 14, 2019. I did do a telehealth visit last week in which point it was questionable whether or not she may be healed and had to bring her in today for confirmation. With that being said she does seem to be doing quite well at this point which is good news. There does not appear to be any drainage in the deed I believe her wounds may be healed. Readmission: 09/04/2019 on evaluation today patient appears to be doing unfortunately  somewhat more poorly in regard to her left foot ulcer secondary to a wound that began on 08/21/2019 at least when she first noticed this. Fortunately she has not had any evidence of active infection at this time. Systemically. I also do not necessarily see any evidence of infection at the blister/wound site on the first metatarsal head plantar aspect. This almost appears to be something that may have just rubbed inappropriately causing this to breakdown. They did not want a wait too long to come in to be seen as again she had significant issues in the past with wounds that took quite a while to heal in fact it was close to 2 years. Nonetheless this does not appear to be quite that bad but again we do need to remove some of the necrotic tissue from the surface of the wound to tell exactly the extent. She does not appear to have any  significant arterial disease at this point and again her last ABIs and TBI's are recorded above in the alert section her left ABI was 1.27 with a TBI of 0.72 to the right ABI 1.08 with a TBI of 0.39. Other than this the patient has been doing quite well since I last saw her and that was in May 2020. [name], [name] ([id number]) 09/11/2019 on evaluation today patient appeared to be doing very well with regard to her plantar foot ulcer on the left. In fact this appears to be almost completely healed which is awesome. That is after just 1 week of intervention. With that being said there is no signs of active infection at this time. 09/18/2019 on evaluation today patient actually appears to be doing excellent in fact she is completely healed based on what I am seeing at this point. Fortunately there is no signs of active infection at this time and overall patient is very pleased to hear that this area has healed so quickly. Readmission: 05/13/2021 upon evaluation today this patient presents for reevaluation here in the clinic. This is a wound that actually we previously took care of. She had 1 on the right ankle and the left the left turned out to be be harder due to to heal but nonetheless is doing great at this point as the right that has reopened and it was noted first just several weeks ago with a scab over it and came off in just the past few days. Fortunately there does not appear to be any obvious evidence of significant active infection at this time which is great news. No fevers, chills, nausea, vomiting, or diarrhea. 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 There does not appear to be any signs of this interfering in any way with her wound. She does have swelling we previously had compression socks for her ordered but again it does not look like she wears these on a regular basis by any means. 05/26/2021 upon evaluation today patient appears  to be doing well with regard to her wound which is actually showing signs of excellent improvement. There does not appear to be any signs of active infection which is great news and overall very pleased with where things stand today. No fevers, chills, nausea, vomiting, or diarrhea. 06/02/2021 upon evaluation today patient's wound actually showing signs of excellent improvement. Fortunately there does not appear to be any signs of active infection which is great news. I think the patient is making good progress with regard to her wounds in  general. 06/09/2021 upon evaluation today patient appears to be doing excellent in regard to her wounds currently. Fortunately there does not appear to be any signs of active infection which is great news. No fevers, chills, nausea, vomiting, or diarrhea. Overall extremely pleased with where things stand today. I think the patient is making excellent progress. 06/16/2021 upon evaluation today patient appears to be doing well in regard to her wound. This is going require little bit of debridement today and that was discussed with the patient. Otherwise she seems to be doing quite well and I am actually very pleased with where things stand at this point. No fevers, chills, nausea, vomiting, or diarrhea. 06/23/2021 upon evaluation today patient appears to be doing well with regard to her wounds. She has been tolerating the dressing changes without complication. Fortunately there does not appear to be any evidence of infection and she has not had air in her home which she actually lives at an assisted living that got fixed this morning. With that being said because of that her wrap has been extremely hot and bothersome for her over the past week. 06/30/2021 upon evaluation today patient is actually making excellent progress in regard to her ankle ulcer. She has been tolerating the dressing changes without complication and overall extremely pleased with where things stand  there does not appear to be any evidence of active infection which is great news. No fevers, chills, nausea, vomiting, or diarrhea. 07/07/21 upon evaluation today patients and culture on the right actually appears to be doing quite well. There does not appear to be any signs of infection and overall very pleased with where things stand today. No fevers, chills, nausea, or vomiting noted at this time. 07/14/2021 unfortunately the patient today has some evidence of deep tissue injury and pressure getting to the ankle region. Again I am not exactly sure what is going on here but this is very similar to issues that we have had in the past. I explained to the patient that she needs to be very mindful of exactly what is happening I think sleeping in bed is probably the main issue here although there could be other culprits I am not sure what else would potentially lead to this kind of a problem for her. Objective Constitutional Well-nourished and well-hydrated in no acute distress. Vitals Time Taken: 11:46 AM, Height: 62 in, Weight: 150 lbs, BMI: 27.4, Temperature: 98.4 F, Pulse: 71 bpm, Respiratory Rate: 18 breaths/min, Blood Pressure: 131/72 mmHg. Respiratory normal breathing without difficulty. Psychiatric this patient is able to make decisions and demonstrates good insight into disease process. Alert and Oriented x 3. pleasant and cooperative. General Notes: Upon inspection patient did have some enlargement of the wound including deep tissue injury and bruising which led to some necrotic tissue around the edge and surface of the wound which did require sharp debridement today as well. Fortunately I was able to debride this away and the patient tolerated this today without complication postdebridement the wound bed appears to be doing much better. Integumentary (Hair, Skin) Musick, [name] ([id number]) Wound #7 status is Open. Original cause of wound was Gradually Appeared. The date acquired  was: 05/12/2021. The wound has been in treatment 8 weeks. The wound is located on the Right,Lateral Malleolus. The wound measures 0.7cm length x 0.6cm width x 0.2cm depth; 0.33cm^2 area and 0.066cm^3 volume. There is Fat Layer (Subcutaneous Tissue) exposed. There is no tunneling or undermining noted. There is a medium amount of serosanguineous drainage noted.  The wound margin is distinct with the outline attached to the wound base. There is large (67-100%) red, pink, pale granulation within the wound bed. There is a small (1-33%) amount of necrotic tissue within the wound bed including Adherent Slough. Assessment Active Problems ICD-10 Pressure ulcer of right ankle, stage 3 Lymphedema, not elsewhere classified Venous insufficiency (chronic) (peripheral) Presence of cardiac pacemaker Long term (current) use of anticoagulants Procedures Wound #7 Pre-procedure diagnosis of Wound #7 is a Pressure Ulcer located on the Right,Lateral Malleolus . There was a Excisional Skin/Subcutaneous Tissue Debridement with a total area of 0.35 sq cm performed by Tommie Sams., PA-C. With the following instrument(s): Curette to remove Viable and Non-Viable tissue/material. Material removed includes Subcutaneous Tissue, Slough, Skin: Dermis, and Skin: Epidermis after achieving pain control using Lidocaine 4% Topical Solution. No specimens were taken. A time out was conducted at 12:04, prior to the start of the procedure. A Minimum amount of bleeding was controlled with Pressure. The procedure was tolerated well with a pain level of 0 throughout and a pain level of 0 following the procedure. Post Debridement Measurements: 0.7cm length x 0.6cm width x 0.2cm depth; 0.066cm^3 volume. Post debridement Stage noted as Category/Stage III. Character of Wound/Ulcer Post Debridement is improved. Post procedure Diagnosis Wound #7: Same as Pre-Procedure Pre-procedure diagnosis of Wound #7 is a Pressure Ulcer located on the  Right,Lateral Malleolus . There was a Three Layer Compression Therapy Procedure by Carlene Coria, RN. Post procedure Diagnosis Wound #7: Same as Pre-Procedure Plan Follow-up Appointments: Return Appointment in 1 week. Nurse Visit as needed Bathing/ Shower/ Hygiene: May shower with wound dressing protected with water repellent cover or cast protector. Edema Control - Lymphedema / Segmental Compressive Device / Other: Optional: One layer of unna paste to top of compression wrap (to act as an anchor). Elevate, Exercise Daily and Avoid Standing for Long Periods of Time. Elevate legs to the level of the heart and pump ankles as often as possible Elevate leg(s) parallel to the floor when sitting. WOUND #7: - Malleolus Wound Laterality: Right, Lateral Cleanser: Soap and Water 1 x Per Week/30 Days Discharge Instructions: Gently cleanse wound with antibacterial soap, rinse and pat dry prior to dressing wounds Primary Dressing: Prisma 4.34 (in) 1 x Per Week/30 Days Discharge Instructions: Moisten w/normal saline or sterile water; Cover wound as directed. Do not remove from wound bed. Primary Dressing: Curad Oil Emulsion Dressing 3x3 (in/in) 1 x Per Week/30 Days Discharge Instructions: over prisma Secondary Dressing: ABD Pad 5x9 (in/in) 1 x Per Week/30 Days Discharge Instructions: Cover with ABD pad Compression Wrap: Profore Lite LF 3 Multilayer Compression Bandaging System 1 x Per Week/30 Days Discharge Instructions: Apply 3 multi-layer wrap as prescribed. ELERI, TINER ([id number]) 1. Would recommend currently that we going to continue with the wound care measures as before and the patient is in agreement with plan. Specifically this includes the use of the silver collagen we will use some oil emulsion over top of this and then subsequently ABD pad followed by the compression wrap. 2. I am also going to recommend that we go ahead and continue with the 3 layer compression wrap specifically  which I think is doing a good job. 3. I would also recommend the juxta lite compression wrap be used on the left leg for prevention as well. We will see patient back for reevaluation in 1 week here in the clinic. If anything worsens or changes patient will contact our office for additional recommendations. Electronic Signature(s)  Signed: 07/14/2021 1:21:09 PM By: Worthy Keeler PA-C Entered By: Worthy Keeler on 07/14/2021 13:21:09 Tripathi, [name] ([id number]) -------------------------------------------------------------------------------- SuperBill Details Patient Name: [name] Date of Service: 07/14/2021 Medical Record Number: [id number] Patient Account Number: [id number] Date of Birth/Sex: 08-29-1923 (87 y.o. F) Treating RN: Carlene Coria Primary Care Provider: Adrian Prows Other Clinician: Referring Provider: Adrian Prows Treating Provider/Extender: Skipper Cliche in Treatment: 8 Diagnosis Coding ICD-10 Codes Code Description [phone] Pressure ulcer of right ankle, stage 3 I89.0 Lymphedema, not elsewhere classified I87.2 Venous insufficiency (chronic) (peripheral) Z95.0 Presence of cardiac pacemaker Z79.01 Long term (current) use of anticoagulants Facility Procedures CPT4 Code: [id number] Description: [id number] - DEB SUBQ TISSUE 20 SQ CM/< Modifier: Quantity: 1 CPT4 Code: Description: ICD-10 Diagnosis Description 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, stage 3 Modifier: Quantity: Physician Procedures CPT4 Code: [id number] Description: [id number] - WC PHYS SUBQ TISS 20 SQ CM Modifier: Quantity: 1 CPT4 Code: Description: ICD-10 Diagnosis Description 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, stage 3 Modifier: Quantity: Electronic Signature(s) Signed: 07/14/2021 1:21:19 PM By: Worthy Keeler PA-C Entered By: Worthy Keeler on 07/14/2021 13:21:19

## 2021-07-19 ENCOUNTER — Other Ambulatory Visit: Payer: Self-pay

## 2021-07-19 DIAGNOSIS — L89513 Pressure ulcer of right ankle, stage 3: Secondary | ICD-10-CM | POA: Diagnosis not present

## 2021-07-19 NOTE — Progress Notes (Signed)
[name], [name] ([id number]) Visit Report for 07/19/2021 Arrival Information Details Patient Name: [name] Date of Service: 07/19/2021 3:45 PM Medical Record Number: [id number] Patient Account Number: [id number] Date of Birth/Sex: 06/02/1923 (87 y.o. F) Treating RN: Carlene Coria Primary Care Henry Utsey: Adrian Prows Other Clinician: Referring Makaria Poarch: Adrian Prows Treating Eyleen Rawlinson/Extender: Skipper Cliche in Treatment: 9 Visit Information History Since Last Visit All ordered tests and consults were completed: No Patient Arrived: [name] Added or deleted any medications: No Arrival Time: 16:10 Any new allergies or adverse reactions: No Transfer Assistance: None Had a fall or experienced change in No Patient Identification Verified: Yes activities of daily living that may affect Secondary Verification Process Completed: Yes risk of falls: Patient Requires Transmission-Based No Signs or symptoms of abuse/neglect since last visito No Precautions: Hospitalized since last visit: No Patient Has Alerts: Yes Implantable device outside of the clinic excluding No Patient Alerts: Patient on Blood cellular tissue based products placed in the center Thinner since last visit: NOT DIABETIC Pain Present Now: No ***ELIQUIS*** Electronic Signature(s) Signed: 07/19/2021 5:27:33 PM By: Carlene Coria RN Entered By: Carlene Coria on 07/19/2021 16:38:43 [name] ([id number]) -------------------------------------------------------------------------------- Clinic Level of Care Assessment Details Patient Name: [name] Date of Service: 07/19/2021 3:45 PM Medical Record Number: [id number] Patient Account Number: [id number] Date of Birth/Sex: [birth year] (87 y.o. F) Treating RN: Carlene Coria Primary Care Janesa Dockery: Adrian Prows Other Clinician: Referring Vernell Townley: Adrian Prows Treating Jowana Thumma/Extender: Skipper Cliche in Treatment: 9 Clinic  Level of Care Assessment Items TOOL 1 Quantity Score '[]'$  - Use when EandM and Procedure is performed on INITIAL visit 0 ASSESSMENTS - Nursing Assessment / Reassessment '[]'$  - General Physical Exam (combine w/ comprehensive assessment (listed just below) when performed on new 0 pt. evals) '[]'$  - 0 Comprehensive Assessment (HX, ROS, Risk Assessments, Wounds Hx, etc.) ASSESSMENTS - Wound and Skin Assessment / Reassessment '[]'$  - Dermatologic / Skin Assessment (not related to wound area) 0 ASSESSMENTS - Ostomy and/or Continence Assessment and Care '[]'$  - Incontinence Assessment and Management 0 '[]'$  - 0 Ostomy Care Assessment and Management (repouching, etc.) PROCESS - Coordination of Care '[]'$  - Simple Patient / Family Education for ongoing care 0 '[]'$  - 0 Complex (extensive) Patient / Family Education for ongoing care '[]'$  - 0 Staff obtains Programmer, systems, Records, Test Results / Process Orders '[]'$  - 0 Staff telephones HHA, Nursing Homes / Clarify orders / etc '[]'$  - 0 Routine Transfer to another Facility (non-emergent condition) '[]'$  - 0 Routine Hospital Admission (non-emergent condition) '[]'$  - 0 New Admissions / Biomedical engineer / Ordering NPWT, Apligraf, etc. '[]'$  - 0 Emergency Hospital Admission (emergent condition) PROCESS - Special Needs '[]'$  - Pediatric / Minor Patient Management 0 '[]'$  - 0 Isolation Patient Management '[]'$  - 0 Hearing / Language / Visual special needs '[]'$  - 0 Assessment of Community assistance (transportation, D/C planning, etc.) '[]'$  - 0 Additional assistance / Altered mentation '[]'$  - 0 Support Surface(s) Assessment (bed, cushion, seat, etc.) INTERVENTIONS - Miscellaneous '[]'$  - External ear exam 0 '[]'$  - 0 Patient Transfer (multiple staff / Civil Service fast streamer / Similar devices) '[]'$  - 0 Simple Staple / Suture removal (25 or less) '[]'$  - 0 Complex Staple / Suture removal (26 or more) '[]'$  - 0 Hypo/Hyperglycemic Management (do not check if billed separately) '[]'$  - 0 Ankle / Brachial Index  (ABI) - do not check if billed separately Has the patient been seen at the hospital within the last three years: Yes Total Score: 0 Level Of  Care: ____ [name] ([id number]) Electronic Signature(s) Signed: 07/19/2021 5:27:33 PM By: Carlene Coria RN Entered By: Carlene Coria on 07/19/2021 16:40:18 [name] ([id number]) -------------------------------------------------------------------------------- Compression Therapy Details Patient Name: [name] Date of Service: 07/19/2021 3:45 PM Medical Record Number: [id number] Patient Account Number: [id number] Date of Birth/Sex: 1923-04-18 (87 y.o. F) Treating RN: Carlene Coria Primary Care Joanathan Affeldt: Adrian Prows Other Clinician: Referring Trayce Caravello: Adrian Prows Treating Geron Mulford/Extender: Skipper Cliche in Treatment: 9 Compression Therapy Performed for Wound Assessment: Wound #7 Right,Lateral Malleolus Performed By: Jake Church, RN Compression Type: Three Layer Electronic Signature(s) Signed: 07/19/2021 5:27:33 PM By: Carlene Coria RN Entered By: Carlene Coria on 07/19/2021 16:39:33 [name] ([id number]) -------------------------------------------------------------------------------- Encounter Discharge Information Details Patient Name: [name] Date of Service: 07/19/2021 3:45 PM Medical Record Number: [id number] Patient Account Number: [id number] Date of Birth/Sex: 1922-12-29 (87 y.o. F) Treating RN: Carlene Coria Primary Care Aundre Hietala: Adrian Prows Other Clinician: Referring Pelham Hennick: Adrian Prows Treating Elizzie Westergard/Extender: Skipper Cliche in Treatment: 9 Encounter Discharge Information Items Discharge Condition: Stable Ambulatory Status: Walker Discharge Destination: Home Transportation: Private Auto Accompanied By: daughter Schedule Follow-up Appointment: Yes Clinical Summary of Care: Patient Declined Electronic Signature(s) Signed: 07/19/2021 5:27:33  PM By: Carlene Coria RN Entered By: Carlene Coria on 07/19/2021 16:40:10 [name] ([id number]) -------------------------------------------------------------------------------- Wound Assessment Details Patient Name: [name] Date of Service: 07/19/2021 3:45 PM Medical Record Number: [id number] Patient Account Number: [id number] Date of Birth/Sex: 06-13-1923 (87 y.o. F) Treating RN: Carlene Coria Primary Care [place]: Adrian Prows Other Clinician: Referring Zayne Marovich: Adrian Prows Treating Cambry Spampinato/Extender: Skipper Cliche in Treatment: 9 Wound Status Wound Number: 7 Primary Pressure Ulcer Etiology: Wound Location: Right, Lateral Malleolus Wound Open Wounding Event: Gradually Appeared Status: Date Acquired: 05/12/2021 Comorbid Cataracts, Congestive Heart Failure, Hypertension, Weeks Of Treatment: 9 History: Peripheral Arterial Disease, Osteoarthritis, Neuropathy Clustered Wound: No Wound Measurements Length: (cm) 0.7 Width: (cm) 0.6 Depth: (cm) 0.2 Area: (cm) 0.33 Volume: (cm) 0.066 % Reduction in Area: 70.6% % Reduction in Volume: 70.7% Epithelialization: Small (1-33%) Tunneling: No Undermining: No Wound Description Classification: Category/Stage III Wound Margin: Distinct, outline attached Exudate Amount: Medium Exudate Type: Serosanguineous Exudate Color: red, brown Foul Odor After Cleansing: No Slough/Fibrino Yes Wound Bed Granulation Amount: Large (67-100%) Exposed Structure Granulation Quality: Red, Pink, Pale Fascia Exposed: No Necrotic Amount: Small (1-33%) Fat Layer (Subcutaneous Tissue) Exposed: Yes Necrotic Quality: Adherent Slough Tendon Exposed: No Muscle Exposed: No Joint Exposed: No Bone Exposed: No Treatment Notes Wound #7 (Malleolus) Wound Laterality: Right, Lateral Cleanser Soap and Water Discharge Instruction: Gently cleanse wound with antibacterial soap, rinse and pat dry prior to dressing wounds Peri-Wound  Care Topical Primary Dressing Prisma 4.34 (in) Discharge Instruction: Moisten w/normal saline or sterile water; Cover wound as directed. Do not remove from wound bed. Curad Oil Emulsion Dressing 3x3 (in/in) Discharge Instruction: over prisma Secondary Dressing ABD Pad 5x9 (in/in) Discharge Instruction: Cover with ABD pad Secured With Louvinia, Defrancisco [name] ([id number]) Compression Wrap Profore Lite LF 3 Multilayer Compression Bandaging System Discharge Instruction: Apply 3 multi-layer wrap as prescribed. Compression Stockings Add-Ons Electronic Signature(s) Signed: 07/19/2021 5:27:33 PM By: Carlene Coria RN Entered By: Carlene Coria on 07/19/2021 16:39:05

## 2021-07-21 ENCOUNTER — Other Ambulatory Visit: Payer: Self-pay

## 2021-07-21 ENCOUNTER — Encounter: Payer: Medicare Other | Admitting: Physician Assistant

## 2021-07-21 DIAGNOSIS — L89513 Pressure ulcer of right ankle, stage 3: Secondary | ICD-10-CM | POA: Diagnosis not present

## 2021-07-21 NOTE — Progress Notes (Addendum)
ELVERTA, SHALA ([id number]) Visit Report for 07/21/2021 Chief Complaint Document Details Patient Name: [name], [name]. Date of Service: 07/21/2021 2:15 PM Medical Record Number: [id number] Patient Account Number: [id number] Date of Birth/Sex: 10/25/1923 (87 y.o. F) Treating RN: Carlene Coria Primary Care Provider: Adrian Prows Other Clinician: Referring Provider: Adrian Prows Treating Provider/Extender: Skipper Cliche in Treatment: 9 Information Obtained from: Patient Chief Complaint Right foot ulcer Electronic Signature(s) Signed: 07/21/2021 2:23:17 PM By: Worthy Keeler PA-C Entered By: Worthy Keeler on 07/21/2021 14:23:17 Freas, [name] ([id number]) -------------------------------------------------------------------------------- Debridement Details Patient Name: [name] Date of Service: 07/21/2021 2:15 PM Medical Record Number: [id number] Patient Account Number: [id number] Date of Birth/Sex: 12-02-1923 (87 y.o. F) Treating RN: Carlene Coria Primary Care Provider: Adrian Prows Other Clinician: Referring Provider: Adrian Prows Treating Provider/Extender: Skipper Cliche in Treatment: 9 Debridement Performed for Wound #7 Right,Lateral Malleolus Assessment: Performed By: Physician Tommie Sams., PA-C Debridement Type: Debridement Level of Consciousness (Pre- Awake and Alert procedure): Pre-procedure Verification/Time Out Yes - 14:37 Taken: Start Time: 14:37 Pain Control: Lidocaine 4% Topical Solution Total Area Debrided (L x W): 0.9 (cm) x 0.7 (cm) = 0.63 (cm) Tissue and other material Viable, Non-Viable, Slough, Subcutaneous, Skin: Dermis , Skin: Epidermis, Slough debrided: Level: Skin/Subcutaneous Tissue Debridement Description: Excisional Instrument: Curette Bleeding: Minimum Hemostasis Achieved: Pressure End Time: 14:41 Procedural Pain: 0 Post Procedural Pain: 0 Response to Treatment: Procedure was tolerated  well Level of Consciousness (Post- Awake and Alert procedure): Post Debridement Measurements of Total Wound Length: (cm) 0.9 Stage: Category/Stage III Width: (cm) 0.7 Depth: (cm) 0.2 Volume: (cm) 0.099 Character of Wound/Ulcer Post Debridement: Improved Post Procedure Diagnosis Same as Pre-procedure Electronic Signature(s) Signed: 07/21/2021 4:48:07 PM By: Worthy Keeler PA-C Signed: 07/21/2021 5:07:13 PM By: Carlene Coria RN Entered By: Carlene Coria on 07/21/2021 14:41:43 Lovelady, [name] ([id number]) -------------------------------------------------------------------------------- HPI Details Patient Name: [name] Date of Service: 07/21/2021 2:15 PM Medical Record Number: [id number] Patient Account Number: [id number] Date of Birth/Sex: 1923-04-24 (87 y.o. 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 Dr. Amalia Hailey the podiatrist saw her on 07/20/2017 and sent her to the wound center for possible hyperbaric oxygen therapy. past medical history of peripheral vascular disease, varicose veins, status post appendectomy, basal cell carcinoma excision from the left leg, cholecystectomy, pacemaker placement, right lower extremity angiography done by Dr. dew in March 2017 with placement of a stent. there is also note of a successful ablation of the right small saphenous vein done which was reviewed by ultrasound on 10/24/2016. the patient had a right small saphenous vein ablation done on 10/20/2016. The patient has never been a smoker. She has been seen by Dr. Corene Cornea dew the vascular surgeon who most  recently saw her on 06/15/2017 for evaluation of ongoing problems with right leg swelling. She had a lower extremity arterial duplex examination done(02/13/17) which showed patent distal right superficial femoral artery stent and above-the-knee popliteal stent without evidence of restenosis. The ABI was more than 1.3 on the right and more than 1.3 on the left. This was consistent with noncompressible arteries due to medial calcification. The right great toe pressure and PPG waveforms are within normal limits and the left  great toe pressure and PPG waveforms are decreased. he recommended she continue to wear her compression stockings and continue with elevation. She is scheduled to have a noninvasive arterial study in the near future 08/16/2017 -- had a lower extremity arterial duplex examination done which showed patent distal right superficial femoral artery stent and above-the-knee popliteal stent without evidence of restenosis. The ABI was more than 1.3 on the right and more than 1.3 on the left. This was consistent with noncompressible arteries due to medial calcification. The right great toe pressure and PPG waveforms are within normal limits and the left great toe pressure and PPG waveforms are decreased. the x-ray of the right ankle has not yet been done 08/24/2017 -- had a right ankle x-ray -- IMPRESSION:1. No fracture, bone lesion or evidence of osteomyelitis. 2. Lateral soft tissue swelling with a soft tissue ulcer. she has not yet seen the vascular surgeon for review 08/31/17 on evaluation today patient's wound appears to be showing signs of improvement. She still with her appointment with vascular in order to review her results of her vascular study and then determine if any intervention would be recommended at that time. No fevers, chills, nausea, or vomiting noted at this time. She has been tolerating the dressing changes without complication. 09/28/17 on evaluation today patient's  wound appears to show signs of good improvement in regard to the granulation tissue which is surfacing. There is still a layer of slough covering the wound and the posterior portion is still significantly deeper than the anterior nonetheless there has been some good sign of things moving towards the better. She is going to go back to Dr. dew for reevaluation to ensure her blood flow is still appropriate. That will be before her next evaluation with [place] next week. No fevers, chills, nausea, or vomiting noted at this time. Patient does have some discomfort rated to be a 3-4/10 depending on activity specifically cleansing the wound makes it worse. 10/05/2017 -- the patient was seen by Dr. Lucky Cowboy last week and noninvasive studies showed a normal right ABI with brisk triphasic waveforms consistent with no arterial insufficiency including normal digital pressures. The duplex showed a patent distal right SFA stent and the proximal SFA was also normal. He was pleased with her test and thought she should have enough of perfusion for normal wound healing. He would see her back in 6 months time. 12/21/17 on evaluation today patient appears to be doing fairly well in regard to her right lateral ankle wound. Unfortunately the main issue that she is expansion at this point is that she is having some issues with what appears to be some cellulitis in the right anterior shin. She has also been noting a little bit of uncomfortable feeling especially last night and her ankle area. I'm afraid that she made the developing a little bit of an infection. With that being said I think it is in the early stages. 12/28/17 on evaluation today patient's ankle appears to be doing excellent. She's making good progress at this point the cellulitis seems to have improved after last week's evaluation. Overall she is having no significant discomfort which is excellent news. She does have an appointment with Dr. dew on March 29, 2018 for  reevaluation in regard to the stent he placed. She seems to have excellent blood flow in the right lower extremity. 01/19/12 on evaluation today patient's wound appears to be doing very well. In fact she does not appear to require debridement at this point,  there's no evidence of infection, and overall from the standpoint of the wound she seems to be doing very well. With that being said I believe that it may be time to switch to different dressing away from the [facility] Dressing she tells me she does have a lot going on her friend actually passed away yesterday and she's also having a lot of issues with her husband this obviously is weighing heavy on her as far as your thoughts and concerns today. 01/25/18 on evaluation today patient appears to be doing fairly well in regard to her right lateral malleolus. She has been tolerating the dressing changes without complication. Overall I feel like this is definitely showing signs of improvement as far as how the overall appearance of the wound is there's also evidence of epithelium start to migrate over the granulation tissue. In general I think that she is progressing nicely as far as the wound is concerned. The only concern she really has is whether or not we can switch to every other week visits in order to avoid having as many appointments as her daughters have a difficult time getting her to her appointments as well as the patient's husband to his he is not doing very well at this point. 02/22/18 on evaluation today patient's right lateral malleolus ulcer appears to be doing great. She has been tolerating the dressing changes without complication. Overall you making excellent progress at this time. Patient is having no significant discomfort. EULONDA, ANDALON ([id number]) 03/15/18 on evaluation today patient appears to be doing much more poorly in regard to her right lateral ankle ulcer at this point. Unfortunately since have last seen her her  husband has passed just a few days ago is obviously weighed heavily on her her daughter also had surgery well she is with her today as usual. There does not appear to be any evidence of infection she does seem to have significant contusion/deep tissue injury to the right lateral malleolus which was not noted previous when I saw her last. It's hard to tell of exactly when this injury occurred although during the time she was spending the night in the hospital this may have been most likely. 03/22/18 on evaluation today patient appears to actually be doing very well in regard to her ulcer. She did unfortunately have a setback which was noted last week however the good news is we seem to be getting back on track and in fact the wound in the core did still have some necrotic tissue which will be addressed at this point today but in general I'm seeing signs that things are on the up and up. She is glad to hear this obviously she's been somewhat concerned that due to the how her wound digressed more recently. 03/29/18 on evaluation today patient appears to be doing fairly well in regard to her right lower extremity lateral malleolus ulcer. She unfortunately does have a new area of pressure injury over the inferior portion where the wound has opened up a little bit larger secondary to the pressure she seems to be getting. She does tell me sometimes when she sleeps at night that it actually hurts and does seem to be pushing on the area little bit more unfortunately. There does not appear to be any evidence of infection which is good news. She has been tolerating the dressing changes without complication. She also did have some bruising in the left second and third toes due to the fact that she may have  bump this or injured it although she has neuropathy so she does not feel she did move recently that may have been where this came from. Nonetheless there does not appear to be any evidence of infection at this  time. 04/12/18 on evaluation today patient's wound on the right lateral ankle actually appears to be doing a little bit better with a lot of necrotic docking tissue centrally loosening up in clearing away. However she does have the beginnings of a deep tissue injury on the left lateral malleolus likely due to the fact we've been trying offload the right as much as we have. I think she may benefit from an assistive soft device to help with offloading and it looks like they're looking at one of the doughnut conditions that wraps around the lower leg to offload which I think will definitely do a good job. With that being said I think we definitely need to address this issue on the left before it becomes a wound. Patient is not having significant pain. 04/19/18 on evaluation today patient appears to be doing excellent in regard to the progress she's made with her right lateral ankle ulcer. The left ankle region which did show evidence of a deep tissue injury seems to be resolving there's little fluid noted underneath and a blister there's nothing open at this point in time overall I feel like this is progressing nicely which is good news. She does not seem to be having significant discomfort at this point which is also good news. 04/25/18-She is here in follow up evaluation for bilateral lateral malleolar ulcers. The right lateral malleolus ulcer with pale subcutaneous tissue exposure, central area of ulcer with tendon/periosteum exposed. The left lateral malleolus ulcer now with central area of nonviable tissue, otherwise deep tissue injury. She is wearing compression wraps to the left lower extremity, she will place the right lower extremity compression wraps on when she gets home. She will be out of town over the weekend and return next week and follow-up appointment. She completed her doxycycline this morning 05/03/18 on evaluation today patient appears to be doing very well in regard to her right  lateral ankle ulcer in general. At least she's showing some signs of improvement in this regard. Unfortunately she has some additional injury to the left lateral malleolus region which appears to be new likely even over the past several days. Again this determination is based on the overall appearance. With that being said the patient is obviously frustrated about this currently. 05/10/18-She is here in follow-up evaluation for bilateral lateral malleolar ulcers. She states she has purchased offloading shoes/boots and they will arrive tomorrow. She was asked to bring them in the office at next week's appointment so her provider is aware of product being utilized. She continues to sleep on right or left side, she has been encouraged to sleep on her back. The right lateral malleolus ulcer is precariously close to peri-osteum; will order xray. The left lateral malleolus ulcer is improved. Will switch back to santyl; she will follow up next week. 05/17/18 on evaluation today patient actually appears to be doing very well in regard to her malleolus her ulcers compared to last time I saw them. She does not seem to have as much in the way of contusion at this point which is great news. With that being said she does continue to have discomfort and I do believe that she is still continuing to benefit from the offloading/pressure reducing boots that were recommended. I  think this is the key to trying to get this to heal up completely. 05/24/18 on evaluation today patient actually appears to be doing worse at this point in time unfortunately compared to her last week's evaluation. She is having really no increased pain which is good news unfortunately she does have more maceration in your theme and noted surrounding the right lateral ankle the left lateral ankle is not really is erythematous I do not see signs of the overt cellulitis on that side. Unfortunately the wounds do not seem to have shown any signs of  improvement since the last evaluation. She also has significant swelling especially on the right compared to previous some of this may be due to infection however also think that she may be served better while she has these wounds by compression wrapping versus continuing to use the Juxta-Lite for the time being. Especially with the amount of drainage that she is experiencing at this point. No fevers, chills, nausea, or vomiting noted at this time. 05/31/18 on evaluation today patient appears to actually be doing better in regard to her right lateral lower extremity ulcer specifically on the malleolus region. She has been tolerating the antibiotic without complication. With that being said she still continues to have issues but a little bit of redness although nothing like she what she was experiencing previous. She still continues to pressure to her ankle area she did get the problem on offloading boots unfortunately she will not wear them she states there too uncomfortable and she can't get in and out of the bed. Nonetheless at this point her wounds seem to be continually getting worse which is not what we want I'm getting somewhat concerned about her progress and how things are going to proceed if we do not intervene in some way shape or form. I therefore had a very lengthy conversation today about offloading yet again and even made a specific suggestion for switching her to a memory foam mattress and even gave the information for a specific one that they could look at getting if it was something that they were interested in considering. She does not want to be considered for a hospital bed air mattress although honestly insurance would not cover it that she does not have any wounds on her trunk. 06/14/18 on evaluation today both wounds over the bilateral lateral malleolus her ulcers appear to be doing better there's no evidence of pressure injury at this point. She did get the foam mattress for her  bed and this does seem to have been extremely beneficial for her in my pinion. Her daughter states that she is having difficulty getting out of bed because of how soft it is. The patient also relates this to be. Nonetheless I do feel like she's actually doing better. Unfortunately right after and around the time she was getting the mattress she also sustained a fall when she got up to go pick up the phone and ended up injuring her right elbow she has 18 sut[address] in place. We are not caring for this currently although home health is going to be taking the sutures out shortly. Nonetheless this may be something that we need to evaluate going forward. It depends on how well it has or has not healed in the end. She also recently saw an orthopedic specialist for an injection in the right shoulder just before her fall unfortunately the fall seems to have worsened her pain. 06/21/18 on evaluation today patient appears to be doing about  the same in regard to her lateral malleolus ulcers. Both appear to be just a little bit deeper but again we are clinging away the necrotic and dead tissue which I think is why this is progressing towards a deeper realm as opposed DONYETTA, OGLETREE. ([id number]) to improving from my measurement standpoint in that regard. Nonetheless she has been tolerating the dressing changes she absolutely hates the memory foam mattress topper that was obtained for her nonetheless I do believe this is still doing excellent as far as taking care of excess pressure in regard to the lateral malleolus regions. She in fact has no pressure injury that I see whereas in weeks past it was week by week I was constantly seeing new pressure injuries. Overall I think it has been very beneficial for her. 07/03/18; patient arrives in my clinic today. She has deep punched out areas over her bilateral lateral malleoli. The area on the right has some more depth. We spent a lot of time today talking about pressure  relief for these areas. This started when her daughter asked for a prescription for a memory foam mattress. I have never written a prescription for a mattress and I don't think insurances would pay for that on an ordinary bed. In any case he came up that she has foam boots that she refuses to wear. I would suggest going to these before any other offloading issues when she is in bed. They say she is meticulous about offloading this the rest of the day 07/10/18- She is seen in follow-up evaluation for bilateral, lateral malleolus ulcers. There is no improvement in the ulcers. She has purchased and is sleeping on a memory foam mattress/overlay, she has been using the offloading boots nightly over the past week. She has a follow up appointment with vascular medicine at the end of October, in my opinion this follow up should be expedited given her deterioration and suboptimal TBI results. We will order plain film xray of the left ankle as deeper structures are palpable; would consider having MRI, regardless of xray report(s). The ulcers will be treated with iodoflex/iodosorb, she is unable to safely change the dressings daily with santyl. 07/19/18 on evaluation today patient appears to be doing in general visually well in regard to her bilateral lateral malleolus ulcers. She has been tolerating the dressing changes without complication which is good news. With that being said we did have an x-ray performed on 07/12/18 which revealed a slight loosen see in the lateral portion of the distal left fibula which may represent artifact but underline lytic destruction or osteomyelitis could not be excluded. MRI was recommended. With that being said we can see about getting the patient scheduled for an MRI to further evaluate this area. In fact we have that scheduled currently for August 20 19,019. 07/26/18 on evaluation today patient's wound on the right lateral ankle actually appears to be doing fairly well at this  point in my pinion. She has made some good progress currently. With that being said unfortunately in regard to the left lateral ankle ulcer this seems to be a little bit more problematic at this time. In fact as I further evaluated the situation she actually had bone exposed which is the first time that's been the case in the bone appear to be necrotic. Currently I did review patient's note from Dr. Bunnie Domino office with [facility]. He stated that ABI was 1.26 on the right and 0.95 on the left with good  waveforms. Her perfusion is stable not reduced from previous studies and her digital waveforms were pretty good particularly on the right. His conclusion upon review of the note was that there was not much she could do to improve her perfusion and he felt she was adequate for wound healing. His suggestion was that she continued to see [place] and consider a synthetic skin graft if there was no underlying infection. He plans to see her back in six months or as needed. 08/01/18 on evaluation today patient appears to be doing better in regard to her right lateral ankle ulcer. Her left lateral ankle ulcer is about the same she still has bone involvement in evidence of necrosis. There does not appear to be evidence of infection at this time On the right lateral lower extremity. I have started her on the Augmentin she picked this up and started this yesterday. This is to get her through until she sees infectious disease which is scheduled for 08/12/18. 08/06/18 on evaluation today patient appears to be doing rather well considering my discussion with patient's daughter at the end of last week. The area which was marked where she had erythema seems to be improved and this is good news. With that being said overall the patient seems to be making good improvement when it comes to the overall appearance of the right lateral ankle ulcer although this has been slow she at least is coming around in this  regard. Unfortunately in regard to the left lateral ankle ulcer this is osteomyelitis based on the pathology report as well is bone culture. Nonetheless we are still waiting CT scan. Unfortunately the MRI we originally ordered cannot be performed as the patient is a pacemaker which I had overlooked. Nonetheless we are working on the CT scan approval and scheduling as of now. She did go to the hospital over the weekend and was placed on IV Cefzo for a couple of days. Fortunately this seems to have improved the erythema quite significantly which is good news. There does not appear to be any evidence of worsening infection at this time. She did have some bleeding after the last debridement therefore I did not perform any sharp debridement in regard to left lateral ankle at this point. Patient has been approved for a snap vac for the right lateral ankle. 08/14/18; the patient with wounds over her bilateral lateral malleoli. The area on the right actually looks quite good. Been using a snap back on this area. Healthy granulation and appears to be filling in. Unfortunately the area on the left is really problematic. 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 Also noted to have cellulitis. She saw Dr. Novella Olive of infectious disease today and was put on linezolid. We are able to verify this with her pharmacy. She is completed the Augmentin that she was already on. We've been using Iodoflex to this area 08/23/18 on evaluation today patient's wounds both actually appear to be doing better compared to my prior evaluations. Fortunately she showing signs of good improvement in regard to the overall wound status especially where were using the snap vac on the right. In regard to left lateral malleolus the wound bed actually appears to be much cleaner than previously noted. I do not feel any phone directly probed during evaluation today  and though there is tendon noted this does not appear to be necrotic it's actually fairly good as far as  the overall appearance of the tendon is concerned. In general the wound bed actually appears to be doing significantly better than it was previous. Patient is currently in the care of [name] and I did review that note today. He actually has her on two weeks of linezolid and then following the patient will be on 1-2 months of Keflex. That is the plan currently. She has been on antibiotics therapy as prescribed by myself initially starting on July 30, 2018 and has been on that continuously up to this point. 08/30/18 on evaluation today patient actually appears to be doing much better in regard to her right lateral malleolus ulcer. She has been tolerating the dressing changes specifically the snap vac without complication although she did have some issues with the seal currently. Apparently there was some trouble with getting it to maintain over the past week past Sunday. Nonetheless overall the wound appears better in regard to the right lateral malleolus region. In regard to left lateral malleolus this actually show some signs of additional granulation although there still tendon noted in the base of the wound this appears to be healthy not necrotic in any way whatsoever. We are considering potentially using a snap vac for the left lateral malleolus as well the product wrap from [employer], [place], was present in the clinic today we're going to see this patient I did have her come in with me after obtaining consent from the patient and her daughter in order to look at the wound and see if there's any recommendation one way or another as to whether or not they felt the snapback could be beneficial for the left lateral malleolus region. But the conclusion was that it might be but that this is definitely a little bit deeper wound than what traditionally would be utilized for a snap vac. 09/06/18 on  evaluation today patient actually appears to be doing excellent in my pinion in regard to both ankle ulcers. She has been tolerating the dressing changes without complication which is great news. Specifically we have been using the snap vac. In regard to the right ankle I'm not even sure that this is going to be necessary for today and following as the wound has filled in quite nicely. In regard to the left ankle I do believe DANAJA, LASOTA ([id number]) that we're seeing excellent epithelialization from the edge as well as granulation in the central portion the tendon is still exposed but there's no evidence of necrotic bone and in general I feel like the patient has made excellent progress even compared to last week with just one week of the snap vac. 09/11/18; this is a patient who has wounds on her bilateral lateral malleoli. Initially both of these were deep stage IV wounds in the setting of chronic arterial insufficiency. She has been revascularized. As I understand think she been using snap vacs to both of these wounds however the area on the right became more superficial and currently she is only using it on the left. Using silver collagen on the right and silver collagen under the back on the left I believe 09/19/18 on evaluation today patient actually appears to be doing very well in regard to her lateral malleolus or ulcers bilaterally. She has been tolerating the dressing changes without complication. Fortunately there does not appear to be any evidence of infection at this time. Overall I feel like she is improving in an excellent manner and I'm very pleased with the fact that everything seems to  be turning towards the better for her. This has obviously been a long road. 09/27/18 on evaluation today patient actually appears to be doing very well in regard to her bilateral lateral malleolus ulcers. She has been tolerating the dressing changes without complication. Fortunately there does  not appear to be any evidence of infection at this time which is also great news. No fevers, chills, nausea, or vomiting noted at this time. Overall I feel like she is doing excellent with the snap vac on the left malleolus. She had 40 mL of fluid collection over the past week. 10/04/18 on evaluation today patient actually appears to be doing well in regard to her bilateral lateral malleolus ulcers. She continues to tolerate the dressing changes without complication. One issue that I see is the snap vac on the left lateral malleolus which appears to have sealed off some fluid underlying this area and has not really allowed it to heal to the degree that I would like to see. For that reason I did suggest at this point we may want to pack a small piece of packing strip into this region to allow it to more effectively wick out fluid. 10/11/18 in general the patient today does not feel that she has been doing very well. She's been a little bit lethargic and subsequently is having bodyaches as well according to what she tells me today. With that being said overall she has been concerned with the fact that something may be worsening although to be honest her wounds really have not been appearing poorly. She does have a new ulcer on her left heel unfortunately. This may be pressure related. Nonetheless it seems to me to have potentially started at least as a blister I do not see any evidence of deep tissue injury. In regard to the left ankle the snap vac still seems to be causing the ceiling off of the deeper part of the wound which is in turn trapping fluid. I'm not extremely pleased with the overall appearance as far as progress from last week to this week therefore I'm gonna discontinue the snap vac at this point. 10/18/18 patient unfortunately this point has not been feeling well for the past several days. She was seen by Grayland Ormond her primary care provider who is a Librarian, academic at [facility]. Subsequently she states that she's been very weak and generally feeling malaise. No fevers, chills, nausea, or vomiting noted at this time. With that being said bloodwork was performed at the PCP office on the 11th of this month which showed a white blood cell count of 10.7. This was repeated today and shows a white blood cell count of 12.4. This does show signs of worsening. Coupled with the fact that she is feeling worse and that her left ankle wound is not really showing signs of improvement I feel like this is an indication that the osteomyelitis is likely exacerbating not improving. Overall I think we may also want to check her C-reactive protein and sedimentation rate. Actually did call [facility] office this afternoon while the patient was in the office here with me. Subsequently based on the findings we discussed treatment possibilities and I think that it is appropriate for [place] to go ahead and initiate treatment with doxycycline which I'm going to do. Subsequently he did agree to see about adding a CRP and sedimentation rate to her orders. If that has not already been drawn to where they can run it they will contact the  patient she can come back to have that check. They are in agreement with plan as far as the patient and her daughter are concerned. Nonetheless also think we need to get in touch with Dr. Henreitta Leber office to see about getting the patient scheduled with him as soon as possible. 11/08/18 on evaluation today patient presents for follow-up concerning her bilateral foot and ankle ulcers. I did do an extensive review of her chart in epic today. Subsequently she was seen by [name] he did initiate Cefepime IV antibiotic therapy. Subsequently she had some issues with her PICC line this had to be removed because it was coiled and then replaced. Fortunately that was now settled. Unfortunately she has continued have issues with her left heel as well as the issues that she is  experiencing with her bilateral lateral malleolus regions. I do believe however both areas seem to be doing a little bit better on evaluation today which is good news. No fevers, chills, nausea, or vomiting noted at this time. She actually has an angiogram schedule with Dr. dew on this coming Monday, November 11, 2018. Subsequently the patient states that she is feeling much better especially than what she was roughly 2 weeks ago. She actually had to cancel an appointment because she was feeling so poorly. No fevers, chills, nausea, or vomiting noted at this time. 11/15/18 on evaluation today patient actually is status post having had her angiogram with Dr. dew Monday, four days ago. 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 He had to go and work on several areas of the vasculature fortunately he was able to obtain no more than a 30% residual stenosis throughout post procedure. I reviewed this note today. I think this will definitely help with healing at this time. Fortunately there does not appear to be any signs of infection and I do feel like ratio already has a better appearance to it. 11/22/18 upon evaluation today patient actually appears to be doing very well in regard to her wounds in general. The right lateral malleolus looks excellent the heel looks better in the left lateral malleolus also appears to be doing a little better. With that being said the right second toe actually appears to be open and training we been watching this is been dry and stable but now is open. 12/03/2018 Seen today for follow-up and management of multiple bilateral lower extremity wounds. New pressure injury of the great toe which is closed at this time. Wound of the right distal second toe appears larger today with deep undermining and a pocket of fluid present within the undermining region. Left and right malleolus is wounds are stable today with no signs and symptoms of infection.Denies  any needs or concerns during exam today. 12/13/18 on evaluation today patient appears to be doing somewhat better in regard to her left heel ulcer. She also seems to be completely healed in regard to the right lateral malleolus ulcer. The left malleolus ulcer is smaller what unfortunately the wounds which are new over the first and second toes of the right foot are what are most concerning at this point especially the second. Both areas did require sharp debridement today. 12/20/18 on evaluation today patient's wound actually appears to be doing better in regard to left lateral ankle and her right lateral ankle continues to remain healed. The hill ulcer on the left is improved. She does have improvement noted as well in regard to both toe ulcers.  Overall I'm very pleased in this regard. No fevers, chills, nausea, or vomiting noted at this time. 12/23/18 on evaluation today patient is seen after she had her toenails trimmed at the podiatrist office due to issues with her right great toe. There was what appeared to be dark eschar on the surface of the wound which had her in the podiatrist concerned. Nonetheless as I remember that during the last office visit I had utilize silver nitrate of this area I was much less concerned about the situation. Subsequently I was able to clean off much of this tissue without any complication today. This does not appear to show any signs of infection and actually look somewhat better Lolli, [name] ([id number]) compared to last time post debridement. Her second toe on the right foot actually had callous over and there did appear still be some fluid underneath this that would require debridement today. 12/27/18 on evaluation today patient actually appears to be showing signs of improvement at all locations. Even the left lateral ankle although this is not quite as great as the other sites. Fortunately there does not appear to be any signs of infection at this time and  both of her toes on the right foot seem to be showing signs of improvement which is good news and very pleased in this regard. 01/03/19 on evaluation today patient appears to be doing better for the most part in regard to her wounds in particular. There does not appear to be any evidence of infection at this time which is good news. Fortunately there is no sign of really worsening anywhere except for the right great toe which she does have what appears to be a bruise/deep tissue injury which is very superficial and already resolving. I'm not sure where this came from I questioned her extensively and she does not recall what may have happened with this. Other than that the patient seems to be doing well even the left lateral ankle ulcer looks good and is getting smaller. 01/10/19 on evaluation today patient appears to be doing well in regard to her left heel wound and both of her toe wounds. Overall I feel like there is definitely improvement here and I'm happy in that regard. With that being said unfortunately she is having issues with the left lateral malleolus ulcer which unfortunately still has a lot of depth to it. This is gonna be a very difficult wound for [place] to be able to truly get to heal. I may want to consider some type of skin substitute to see if this would be of benefit for her. I'll discuss this with her more the next visit most likely. This was something I thought about more at the end of the visit when I was Artie out of the room and the patient had been discharged. 01/17/19 on evaluation today patient appears to be doing very well in regard to her wounds in general. She's been making excellent progress at this time. Fortunately there's no sign of infection at this time either. No fevers, chills, nausea, or vomiting noted at this time. The biggest issue is still her left lateral malleolus where it appears to be doing well and is getting smaller but still shows a small corner where this is  deeper and goes down into what appears to be the joint space. Nonetheless this is taking much longer to heal although it still looks better in smaller than previous evaluations. 01/24/19 on evaluation today patient's wounds actually appear to be doing  rather well in general overall. She did require some sharp debridement in regard to the right great toe but everything else appears to be doing excellent no debridement was even necessary. No fevers, chills, nausea, or vomiting noted at this time. 01/31/19 on evaluation today patient actually appears to be doing much better in regard to her left foot wound on the heel as well as the ankle. The right great toe appears to be a little bit worse today this had callous over and trapped a lot of fluid underneath. Fortunately there's no signs of infection at any site which is great news. 02/07/19 on evaluation today patient actually appears to be doing decently well in regard to all of her ulcers at this point. No sharp debridement was required she is a little bit of hyper granulation in regard to the left lateral ankle as well as the left heel but the hill itself is almost completely healed which is excellent news. Overall been very pleased in this regard. 02/14/19 on evaluation today patient actually appears to be doing very well in regard to her ulcers on the right first toe, left lateral malleolus, and left heel. In fact the heel is almost completely healed at this point. The patient does not show any signs of infection which is good news. Overall very pleased with how things have progressed. 04/18/19 Telehealth Evaluation During the COVID-19 National Emergency: Verbal Consent: Obtained from patient Allergies: reviewed and the active list is current. Medication changes: patient has no current medication changes. COVID-19 Screening: 1. Have you traveled internationally or on a cruise ship in the last 14 dayso No 2. Have you had contact with someone with or  under investigation for COVID-19o No 3. Have you had a fever, cough, sore throat, or experiencing shortness of breatho No on evaluation today actually did have a visit with this patient through a telehealth encounter with her home health nurse. Subsequently it was noted that the patient actually appears to be doing okay in regard to her wounds both the right great toe as well as the left lateral malleolus have shown signs of improvement although this in your theme around the left lateral malleolus there eschar coverings for both locations. The question is whether or not they are actually close and whether or not home health needs to discharge the patient or not. Nonetheless my concern is this point obviously is that without actually seeing her and being able to evaluate this directly I cannot ensure that she is completely healed which is the question that I'm being asked. 04/22/19 on evaluation today patient presents for her first evaluation since last time I saw her which was actually February 14, 2019. I did do a telehealth visit last week in which point it was questionable whether or not she may be healed and had to bring her in today for confirmation. With that being said she does seem to be doing quite well at this point which is good news. There does not appear to be any drainage in the deed I believe her wounds may be healed. Readmission: 09/04/2019 on evaluation today patient appears to be doing unfortunately somewhat more poorly in regard to her left foot ulcer secondary to a wound that began on 08/21/2019 at least when she first noticed this. Fortunately she has not had any evidence of active infection at this time. Systemically. I also do not necessarily see any evidence of infection at the blister/wound site on the first metatarsal head plantar aspect. This almost  appears to be something that may have just rubbed inappropriately causing this to breakdown. They did not want a wait too long to  come in to be seen as again she had significant issues in the past with wounds that took quite a while to heal in fact it was close to 2 years. Nonetheless this does not appear to be quite that bad but again we do need to remove some of the necrotic tissue from the surface of the wound to tell exactly the extent. She does not appear to have any significant arterial disease at this point and again her last ABIs and TBI's are recorded above in the alert section her left ABI was 1.27 with a TBI of 0.72 to the right ABI 1.08 with a TBI of 0.39. Other than this the patient has been doing quite well since I last saw her and that was in May 2020. 09/11/2019 on evaluation today patient appeared to be doing very well with regard to her plantar foot ulcer on the left. In fact this appears to be almost completely healed which is awesome. That is after just 1 week of intervention. With that being said there is no signs of active infection at this time. 09/18/2019 on evaluation today patient actually appears to be doing excellent in fact she is completely healed based on what I am seeing at this JAMISHA, BRAMLET. ([id number]) point. Fortunately there is no signs of active infection at this time and overall patient is very pleased to hear that this area has healed so quickly. Readmission: 05/13/2021 upon evaluation today this patient presents for reevaluation here in the clinic. This is a wound that actually we previously took care of. She had 1 on the right ankle and the left the left turned out to be be harder due to to heal but nonetheless is doing great at this point as the right that has reopened and it was noted first just several weeks ago with a scab over it and came off in just the past few days. Fortunately there does not appear to be any obvious evidence of significant active infection at this time which is great news. No fevers, chills, nausea, vomiting, or diarrhea. The patient does have a history of  pacemaker along with being on Eliquis currently as well. There does not appear to be any signs of this interfering in any way with her wound. She does have swelling we previously had compression socks for her ordered but again it does not look like she wears these on a regular basis by any means. 05/26/2021 upon evaluation today patient appears to be doing well with regard to her wound which is actually showing signs of excellent improvement. There does not appear to be any signs of active infection which is great news and overall very pleased with where things stand today. No fevers, chills, nausea, vomiting, or diarrhea. 06/02/2021 upon evaluation today patient's wound actually showing signs of excellent improvement. Fortunately there does not appear to be any signs of active infection which is great news. I think the patient is making good progress with regard to her wounds in general. 06/09/2021 upon evaluation today patient appears to be doing excellent in regard to her wounds currently. Fortunately there does not appear to be any signs of active infection which is great news. No fevers, chills, nausea, vomiting, or diarrhea. Overall extremely pleased with where things stand today. I think the patient is making excellent progress. 06/16/2021 upon evaluation today patient appears  to be doing well in regard to her wound. This is going require little bit of debridement today and that was discussed with the patient. Otherwise she seems to be doing quite well and I am actually very pleased with where things stand at this point. No fevers, chills, nausea, vomiting, or diarrhea. 06/23/2021 upon evaluation today patient appears to be doing well with regard to her wounds. She has been tolerating the dressing changes without complication. Fortunately there does not appear to be any evidence of infection and she has not had air in her home which she actually lives at an assisted living that got fixed this  morning. With that being said because of that her wrap has been extremely hot and bothersome for her over the past week. 06/30/2021 upon evaluation today patient is actually making excellent progress in regard to her ankle ulcer. She has been tolerating the dressing changes without complication and overall extremely pleased with where things stand there does not appear to be any evidence of active infection which is great news. No fevers, chills, nausea, vomiting, or diarrhea. 07/07/21 upon evaluation today patients and culture on the right actually appears to be doing quite well. There does not appear to be any signs of infection and overall very pleased with where things stand today. No fevers, chills, nausea, or vomiting noted at this time. 07/14/2021 unfortunately the patient today has some evidence of deep tissue injury and pressure getting to the ankle region. Again I am not exactly sure what is going on here but this is very similar to issues that we have had in the past. I explained to the patient that she needs to be very mindful of exactly what is happening I think sleeping in bed is probably the main issue here although there could be other culprits I am not sure what else would potentially lead to this kind of a problem for her. 07/21/2021 upon evaluation today patient's wound actually showing signs of improvement compared to last week. Fortunately there does not appear to be any signs of active infection which is great news and overall very pleased with where things stand in that regard. With that being said I do believe that she is continuing to show signs of overall of getting better although I think this is still basically about what we were 2 weeks ago due to the worsening and now improvement. Electronic Signature(s) Signed: 07/21/2021 2:47:21 PM By: Worthy Keeler PA-C Entered By: Worthy Keeler on 07/21/2021 14:47:21 Tiley, [name]  ([id number]) -------------------------------------------------------------------------------- Physical Exam Details Patient Name: [name] Date of Service: 07/21/2021 2:15 PM Medical Record Number: [id number] Patient Account Number: [id number] Date of Birth/Sex: May 24, 1923 (87 y.o. F) Treating RN: Carlene Coria Primary Care Provider: Adrian Prows Other Clinician: Referring Provider: Adrian Prows Treating Provider/Extender: Skipper Cliche in Treatment: 9 Constitutional Well-nourished and well-hydrated in no acute distress. Respiratory normal breathing without difficulty. Psychiatric this patient is able to make decisions and demonstrates good insight into disease process. Alert and Oriented x 3. pleasant and cooperative. Notes Upon inspection patient's wound bed actually showed signs of good granulation and epithelization with some slough noted I did perform sharp debridement to clear away some of the necrotic debris she tolerated that today without complication and postdebridement the wound bed appears to be doing much better which is great news. Electronic Signature(s) Signed: 07/21/2021 2:47:37 PM By: Worthy Keeler PA-C Entered By: Worthy Keeler on 07/21/2021 14:47:37 [place], [name] ([id number]) -------------------------------------------------------------------------------- Physician  Orders Details Patient Name: DARNIKA, SOBEL. Date of Service: 07/21/2021 2:15 PM Medical Record Number: [id number] Patient Account Number: [id number] Date of Birth/Sex: 1923/07/20 (87 y.o. F) Treating RN: Carlene Coria Primary Care Provider: Adrian Prows Other Clinician: Referring Provider: Adrian Prows Treating Provider/Extender: Skipper Cliche in Treatment: 9 Verbal / Phone Orders: No Diagnosis Coding ICD-10 Coding Code Description 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, stage 3 I89.0 Lymphedema, not elsewhere classified I87.2 Venous insufficiency  (chronic) (peripheral) Z95.0 Presence of cardiac pacemaker Z79.01 Long term (current) use of anticoagulants Follow-up Appointments o Return Appointment in 1 week. Bathing/ Shower/ Hygiene o May shower with wound dressing protected with water repellent cover or cast protector. Edema Control - Lymphedema / Segmental Compressive Device / Other o Optional: One layer of unna paste to top of compression wrap (to act as an anchor). - and at toes o Elevate, Exercise Daily and Avoid Standing for Long Periods of Time. o Elevate legs to the level of the heart and pump ankles as often as possible o Elevate leg(s) parallel to the floor when sitting. Wound Treatment Wound #7 - Malleolus Wound Laterality: Right, Lateral Cleanser: Soap and Water 1 x Per Week/30 Days Discharge Instructions: Gently cleanse wound with antibacterial soap, rinse and pat dry prior to dressing wounds Primary Dressing: Prisma 4.34 (in) 1 x Per Week/30 Days Discharge Instructions: Moisten w/normal saline or sterile water; Cover wound as directed. Do not remove from wound bed. Primary Dressing: Curad Oil Emulsion Dressing 3x3 (in/in) 1 x Per Week/30 Days Discharge Instructions: over prisma Secondary Dressing: ABD Pad 5x9 (in/in) 1 x Per Week/30 Days Discharge Instructions: Cover with ABD pad Compression Wrap: Profore Lite LF 3 Multilayer Compression Bandaging System 1 x Per Week/30 Days Discharge Instructions: Apply 3 multi-layer wrap as prescribed. Electronic Signature(s) Signed: 07/21/2021 4:48:07 PM By: Worthy Keeler PA-C Signed: 07/21/2021 5:07:13 PM By: Carlene Coria RN Entered By: Carlene Coria on 07/21/2021 14:42:22 [place], [name] ([id number]) -------------------------------------------------------------------------------- Problem List Details Patient Name: KANISE, GLACE Date of Service: 07/21/2021 2:15 PM Medical Record Number: [id number] Patient Account Number: [id number] Date of Birth/Sex:  13-Dec-1922 (87 y.o. F) Treating RN: Carlene Coria Primary Care Provider: Adrian Prows Other Clinician: Referring Provider: Adrian Prows Treating Provider/Extender: Skipper Cliche in Treatment: 9 Active Problems ICD-10 Encounter Code Description Active Date MDM Diagnosis 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, stage 3 05/13/2021 No Yes I89.0 Lymphedema, not elsewhere classified 05/13/2021 No Yes I87.2 Venous insufficiency (chronic) (peripheral) 05/13/2021 No Yes Z95.0 Presence of cardiac pacemaker 05/13/2021 No Yes Z79.01 Long term (current) use of anticoagulants 05/13/2021 No Yes Inactive Problems Resolved Problems Electronic Signature(s) Signed: 07/21/2021 2:23:10 PM By: Worthy Keeler PA-C Entered By: Worthy Keeler on 07/21/2021 14:23:09 [place], [name] ([id number]) -------------------------------------------------------------------------------- Progress Note Details Patient Name: [name] Date of Service: 07/21/2021 2:15 PM Medical Record Number: [id number] Patient Account Number: [id number] Date of Birth/Sex: Apr 28, 1923 (87 y.o. 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 Dr. Amalia Hailey the podiatrist saw her on 07/20/2017 and sent her to the wound center for possible hyperbaric oxygen therapy. past medical history of peripheral vascular disease, varicose veins, status post  appendectomy, basal cell carcinoma excision from the left leg, cholecystectomy, pacemaker placement, right lower extremity angiography done by  Dr. dew in March 2017 with placement of a stent. there is also note of a successful ablation of the right small saphenous vein done which was reviewed by ultrasound on 10/24/2016. the patient had a right small saphenous vein ablation done on 10/20/2016. The patient has never been a smoker. She has been seen by Dr. Corene Cornea dew the vascular surgeon who most recently saw her on 06/15/2017 for evaluation of ongoing problems with right leg swelling. She had a lower extremity arterial duplex examination done(02/13/17) which showed patent distal right superficial femoral artery stent and above-the-knee popliteal stent without evidence of restenosis. The ABI was more than 1.3 on the right and more than 1.3 on the left. This was consistent with noncompressible arteries due to medial calcification. The right great toe pressure and PPG waveforms are within normal limits and the left great toe pressure and PPG waveforms are decreased. he recommended she continue to wear her compression stockings and continue with elevation. She is scheduled to have a noninvasive arterial study in the near future 08/16/2017 -- had a lower extremity arterial duplex examination done which showed patent distal right superficial femoral artery stent and above-the-knee popliteal stent without evidence of restenosis. The ABI was more than 1.3 on the right and more than 1.3 on the left. This was consistent with noncompressible arteries due to medial calcification. The right great toe pressure and PPG waveforms are within normal limits and the left great toe pressure and PPG waveforms are decreased. the x-ray of the right ankle has not yet been done 08/24/2017 -- had a right ankle x-ray -- IMPRESSION:1. No fracture, bone lesion or evidence of osteomyelitis. 2. Lateral soft tissue swelling with a soft tissue ulcer. she has not yet seen the vascular surgeon for review 08/31/17 on evaluation today patient's wound appears to be showing signs  of improvement. She still with her appointment with vascular in order to review her results of her vascular study and then determine if any intervention would be recommended at that time. No fevers, chills, nausea, or vomiting noted at this time. She has been tolerating the dressing changes without complication. 09/28/17 on evaluation today patient's wound appears to show signs of good improvement in regard to the granulation tissue which is surfacing. There is still a layer of slough covering the wound and the posterior portion is still significantly deeper than the anterior nonetheless there has been some good sign of things moving towards the better. She is going to go back to Dr. dew for reevaluation to ensure her blood flow is still appropriate. That will be before her next evaluation with [place] next week. No fevers, chills, nausea, or vomiting noted at this time. Patient does have some discomfort rated to be a 3-4/10 depending on activity specifically cleansing the wound makes it worse. 10/05/2017 -- the patient was seen by Dr. Lucky Cowboy last week and noninvasive studies showed a normal right ABI with brisk triphasic waveforms consistent with no arterial insufficiency including normal digital pressures. The duplex showed a patent distal right SFA stent and the proximal SFA was also normal. He was pleased with her test and thought she should have enough of perfusion for normal wound healing. He would see her back in 6 months time. 12/21/17 on evaluation today patient appears to be doing fairly well in regard to her right lateral ankle wound. Unfortunately the main issue that she  is expansion at this point is that she is having some issues with what appears to be some cellulitis in the right anterior shin. She has also been noting a little bit of uncomfortable feeling especially last night and her ankle area. I'm afraid that she made the developing a little bit of an infection. With that being said I think  it is in the early stages. 12/28/17 on evaluation today patient's ankle appears to be doing excellent. She's making good progress at this point the cellulitis seems to have improved after last week's evaluation. Overall she is having no significant discomfort which is excellent news. She does have an appointment with Dr. dew on March 29, 2018 for reevaluation in regard to the stent he placed. She seems to have excellent blood flow in the right lower extremity. 01/19/12 on evaluation today patient's wound appears to be doing very well. In fact she does not appear to require debridement at this point, there's no evidence of infection, and overall from the standpoint of the wound she seems to be doing very well. With that being said I believe that it may be time to switch to different dressing away from the [facility] Dressing she tells me she does have a lot going on her friend actually passed away yesterday and she's also having a lot of issues with her husband this obviously is weighing heavy on her as far as your thoughts and concerns today. 01/25/18 on evaluation today patient appears to be doing fairly well in regard to her right lateral malleolus. She has been tolerating the dressing changes without complication. Overall I feel like this is definitely showing signs of improvement as far as how the overall appearance of the wound is there's also evidence of epithelium start to migrate over the granulation tissue. In general I think that she is progressing nicely as far as the wound is concerned. The only concern she really has is whether or not we can switch to every other week visits in order to avoid having as many TOMIKO, CARACCIOLO ([id number]) appointments as her daughters have a difficult time getting her to her appointments as well as the patient's husband to his he is not doing very well at this point. 02/22/18 on evaluation today patient's right lateral malleolus ulcer appears to be doing  great. She has been tolerating the dressing changes without complication. Overall you making excellent progress at this time. Patient is having no significant discomfort. 03/15/18 on evaluation today patient appears to be doing much more poorly in regard to her right lateral ankle ulcer at this point. Unfortunately since have last seen her her husband has passed just a few days ago is obviously weighed heavily on her her daughter also had surgery well she is with her today as usual. There does not appear to be any evidence of infection she does seem to have significant contusion/deep tissue injury to the right lateral malleolus which was not noted previous when I saw her last. It's hard to tell of exactly when this injury occurred although during the time she was spending the night in the hospital this may have been most likely. 03/22/18 on evaluation today patient appears to actually be doing very well in regard to her ulcer. She did unfortunately have a setback which was noted last week however the good news is we seem to be getting back on track and in fact the wound in the core did still have some necrotic tissue which will be addressed  at this point today but in general I'm seeing signs that things are on the up and up. She is glad to hear this obviously she's been somewhat concerned that due to the how her wound digressed more recently. 03/29/18 on evaluation today patient appears to be doing fairly well in regard to her right lower extremity lateral malleolus ulcer. She unfortunately does have a new area of pressure injury over the inferior portion where the wound has opened up a little bit larger secondary to the pressure she seems to be getting. She does tell me sometimes when she sleeps at night that it actually hurts and does seem to be pushing on the area little bit more unfortunately. There does not appear to be any evidence of infection which is good news. She has been tolerating the  dressing changes without complication. She also did have some bruising in the left second and third toes due to the fact that she may have bump this or injured it although she has neuropathy so she does not feel she did move recently that may have been where this came from. Nonetheless there does not appear to be any evidence of infection at this time. 04/12/18 on evaluation today patient's wound on the right lateral ankle actually appears to be doing a little bit better with a lot of necrotic docking tissue centrally loosening up in clearing away. However she does have the beginnings of a deep tissue injury on the left lateral malleolus likely due to the fact we've been trying offload the right as much as we have. I think she may benefit from an assistive soft device to help with offloading and it looks like they're looking at one of the doughnut conditions that wraps around the lower leg to offload which I think will definitely do a good job. With that being said I think we definitely need to address this issue on the left before it becomes a wound. Patient is not having significant pain. 04/19/18 on evaluation today patient appears to be doing excellent in regard to the progress she's made with her right lateral ankle ulcer. The left ankle region which did show evidence of a deep tissue injury seems to be resolving there's little fluid noted underneath and a blister there's nothing open at this point in time overall I feel like this is progressing nicely which is good news. She does not seem to be having significant discomfort at this point which is also good news. 04/25/18-She is here in follow up evaluation for bilateral lateral malleolar ulcers. The right lateral malleolus ulcer with pale subcutaneous tissue exposure, central area of ulcer with tendon/periosteum exposed. The left lateral malleolus ulcer now with central area of nonviable tissue, otherwise deep tissue injury. She is wearing  compression wraps to the left lower extremity, she will place the right lower extremity compression wraps on when she gets home. She will be out of town over the weekend and return next week and follow-up appointment. She completed her doxycycline this morning 05/03/18 on evaluation today patient appears to be doing very well in regard to her right lateral ankle ulcer in general. At least she's showing some signs of improvement in this regard. Unfortunately she has some additional injury to the left lateral malleolus region which appears to be new likely even over the past several days. Again this determination is based on the overall appearance. With that being said the patient is obviously frustrated about this currently. 05/10/18-She is here in follow-up evaluation  for bilateral lateral malleolar ulcers. She states she has purchased offloading shoes/boots and they will arrive tomorrow. She was asked to bring them in the office at next week's appointment so her provider is aware of product being utilized. She continues to sleep on right or left side, she has been encouraged to sleep on her back. The right lateral malleolus ulcer is precariously close to peri-osteum; will order xray. The left lateral malleolus ulcer is improved. Will switch back to santyl; she will follow up next week. 05/17/18 on evaluation today patient actually appears to be doing very well in regard to her malleolus her ulcers compared to last time I saw them. She does not seem to have as much in the way of contusion at this point which is great news. With that being said she does continue to have discomfort and I do believe that she is still continuing to benefit from the offloading/pressure reducing boots that were recommended. I think this is the key to trying to get this to heal up completely. 05/24/18 on evaluation today patient actually appears to be doing worse at this point in time unfortunately compared to her last week's  evaluation. She is having really no increased pain which is good news unfortunately she does have more maceration in your theme and noted surrounding the right lateral ankle the left lateral ankle is not really is erythematous I do not see signs of the overt cellulitis on that side. Unfortunately the wounds do not seem to have shown any signs of improvement since the last evaluation. She also has significant swelling especially on the right compared to previous some of this may be due to infection however also think that she may be served better while she has these wounds by compression wrapping versus continuing to use the Juxta-Lite for the time being. Especially with the amount of drainage that she is experiencing at this point. No fevers, chills, nausea, or vomiting noted at this time. 05/31/18 on evaluation today patient appears to actually be doing better in regard to her right lateral lower extremity ulcer specifically on the malleolus region. She has been tolerating the antibiotic without complication. With that being said she still continues to have issues but a little bit of redness although nothing like she what she was experiencing previous. She still continues to pressure to her ankle area she did get the problem on offloading boots unfortunately she will not wear them she states there too uncomfortable and she can't get in and out of the bed. Nonetheless at this point her wounds seem to be continually getting worse which is not what we want I'm getting somewhat concerned about her progress and how things are going to proceed if we do not intervene in some way shape or form. I therefore had a very lengthy conversation today about offloading yet again and even made a specific suggestion for switching her to a memory foam mattress and even gave the information for a specific one that they could look at getting if it was something that they were interested in considering. She does not want to  be considered for a hospital bed air mattress although honestly insurance would not cover it that she does not have any wounds on her trunk. 06/14/18 on evaluation today both wounds over the bilateral lateral malleolus her ulcers appear to be doing better there's no evidence of pressure injury at this point. She did get the foam mattress for her bed and this does seem to have  been extremely beneficial for her in my pinion. Her daughter states that she is having difficulty getting out of bed because of how soft it is. The patient also relates this to be. Nonetheless I do feel like she's actually doing better. Unfortunately right after and around the time she was getting the mattress she also sustained a fall when she got up to go pick up the phone and ended up injuring her right elbow she has 18 sut[address] in place. We are not caring for this currently although home health is going to be taking the sutures out shortly. Nonetheless this may be something that we need to evaluate going forward. It depends on BOWEN, BONTON ([id number]) how well it has or has not healed in the end. She also recently saw an orthopedic specialist for an injection in the right shoulder just before her fall unfortunately the fall seems to have worsened her pain. 06/21/18 on evaluation today patient appears to be doing about the same in regard to her lateral malleolus ulcers. Both appear to be just a little bit deeper but again we are clinging away the necrotic and dead tissue which I think is why this is progressing towards a deeper realm as opposed to improving from my measurement standpoint in that regard. Nonetheless she has been tolerating the dressing changes she absolutely hates the memory foam mattress topper that was obtained for her nonetheless I do believe this is still doing excellent as far as taking care of excess pressure in regard to the lateral malleolus regions. She in fact has no pressure injury that I see  whereas in weeks past it was week by week I was constantly seeing new pressure injuries. Overall I think it has been very beneficial for her. 07/03/18; patient arrives in my clinic today. She has deep punched out areas over her bilateral lateral malleoli. The area on the right has some more depth. We spent a lot of time today talking about pressure relief for these areas. This started when her daughter asked for a prescription for a memory foam mattress. I have never written a prescription for a mattress and I don't think insurances would pay for that on an ordinary bed. In any case he came up that she has foam boots that she refuses to wear. I would suggest going to these before any other offloading issues when she is in bed. They say she is meticulous about offloading this the rest of the day 07/10/18- She is seen in follow-up evaluation for bilateral, lateral malleolus ulcers. There is no improvement in the ulcers. She has purchased and is sleeping on a memory foam mattress/overlay, she has been using the offloading boots nightly over the past week. She has a follow up appointment with vascular medicine at the end of October, in my opinion this follow up should be expedited given her deterioration and suboptimal TBI results. We will order plain film xray of the left ankle as deeper structures are palpable; would consider having MRI, regardless of xray report(s). The ulcers will be treated with iodoflex/iodosorb, she is unable to safely change the dressings daily with santyl. 07/19/18 on evaluation today patient appears to be doing in general visually well in regard to her bilateral lateral malleolus ulcers. She has been tolerating the dressing changes without complication which is good news. With that being said we did have an x-ray performed on 07/12/18 which revealed a slight loosen see in the lateral portion of the distal left fibula which may  represent artifact but underline lytic destruction  or osteomyelitis could not be excluded. MRI was recommended. With that being said we can see about getting the patient scheduled for an MRI to further evaluate this area. In fact we have that scheduled currently for August 20 19,019. 07/26/18 on evaluation today patient's wound on the right lateral ankle actually appears to be doing fairly well at this point in my pinion. She has made some good progress currently. With that being said unfortunately in regard to the left lateral ankle ulcer this seems to be a little bit more problematic at this time. In fact as I further evaluated the situation she actually had bone exposed which is the first time that's been the case in the bone appear to be necrotic. Currently I did review patient's note from Dr. Bunnie Domino office with [facility]. He stated that ABI was 1.26 on the right and 0.95 on the left with good waveforms. Her perfusion is stable not reduced from previous studies and her digital waveforms were pretty good particularly on the right. His conclusion upon review of the note was that there was not much she could do to improve her perfusion and he felt she was adequate for wound healing. His suggestion was that she continued to see [place] and consider a synthetic skin graft if there was no underlying infection. He plans to see her back in six months or as needed. 08/01/18 on evaluation today patient appears to be doing better in regard to her right lateral ankle ulcer. Her left lateral ankle ulcer is about the same she still has bone involvement in evidence of necrosis. There does not appear to be evidence of infection at this time On the right lateral lower extremity. I have started her on the Augmentin she picked this up and started this yesterday. This is to get her through until she sees infectious disease which is scheduled for 08/12/18. 08/06/18 on evaluation today patient appears to be doing rather well considering my discussion with  patient's daughter at the end of last week. The area which was marked where she had erythema seems to be improved and this is good news. With that being said overall the patient seems to be making good improvement when it comes to the overall appearance of the right lateral ankle ulcer although this has been slow she at least is coming around in this regard. Unfortunately in regard to the left lateral ankle ulcer this is osteomyelitis based on the pathology report as well is bone culture. Nonetheless we are still waiting CT scan. Unfortunately the MRI we originally ordered cannot be performed as the patient is a pacemaker which I had overlooked. Nonetheless we are working on the CT scan approval and scheduling as of now. She did go to the hospital over the weekend and was placed on IV Cefzo for a couple of days. Fortunately this seems to have improved the erythema quite significantly which is good news. There does not appear to be any evidence of worsening infection at this time. She did have some bleeding after the last debridement therefore I did not perform any sharp debridement in regard to left lateral ankle at this point. Patient has been approved for a snap vac for the right lateral ankle. 08/14/18; the patient with wounds over her bilateral lateral malleoli. The area on the right actually looks quite good. Been using a snap back on this area. Healthy granulation and appears to be filling in. Unfortunately the  area on the left is really problematic. 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 Also noted to have cellulitis. She saw Dr. Novella Olive of infectious disease today and was put on linezolid. We are able to verify this with her pharmacy. She is completed the Augmentin that she was already on. We've been using Iodoflex to this area 08/23/18 on evaluation today patient's wounds both actually appear to be doing better  compared to my prior evaluations. Fortunately she showing signs of good improvement in regard to the overall wound status especially where were using the snap vac on the right. In regard to left lateral malleolus the wound bed actually appears to be much cleaner than previously noted. I do not feel any phone directly probed during evaluation today and though there is tendon noted this does not appear to be necrotic it's actually fairly good as far as the overall appearance of the tendon is concerned. In general the wound bed actually appears to be doing significantly better than it was previous. Patient is currently in the care of [name] and I did review that note today. He actually has her on two weeks of linezolid and then following the patient will be on 1-2 months of Keflex. That is the plan currently. She has been on antibiotics therapy as prescribed by myself initially starting on July 30, 2018 and has been on that continuously up to this point. 08/30/18 on evaluation today patient actually appears to be doing much better in regard to her right lateral malleolus ulcer. She has been tolerating the dressing changes specifically the snap vac without complication although she did have some issues with the seal currently. Apparently there was some trouble with getting it to maintain over the past week past Sunday. Nonetheless overall the wound appears better in regard to the right lateral malleolus region. In regard to left lateral malleolus this actually show some signs of additional granulation although there still tendon noted in the base of the wound this appears to be healthy not necrotic in any way whatsoever. We are considering potentially using a snap vac for the left lateral malleolus as well the product wrap from [employer], [place], was present in the clinic today we're going to see this patient I did have her come in with me after obtaining consent from the patient and her daughter in order  to look at the wound and see if there's any recommendation one way or another as to whether or not they felt the snapback could be beneficial for the left lateral malleolus region. But TYARA, CORWIN ([id number]) the conclusion was that it might be but that this is definitely a little bit deeper wound than what traditionally would be utilized for a snap vac. 09/06/18 on evaluation today patient actually appears to be doing excellent in my pinion in regard to both ankle ulcers. She has been tolerating the dressing changes without complication which is great news. Specifically we have been using the snap vac. In regard to the right ankle I'm not even sure that this is going to be necessary for today and following as the wound has filled in quite nicely. In regard to the left ankle I do believe that we're seeing excellent epithelialization from the edge as well as granulation in the central portion the tendon is still exposed but there's no evidence of necrotic bone and in general I feel like the patient has made  excellent progress even compared to last week with just one week of the snap vac. 09/11/18; this is a patient who has wounds on her bilateral lateral malleoli. Initially both of these were deep stage IV wounds in the setting of chronic arterial insufficiency. She has been revascularized. As I understand think she been using snap vacs to both of these wounds however the area on the right became more superficial and currently she is only using it on the left. Using silver collagen on the right and silver collagen under the back on the left I believe 09/19/18 on evaluation today patient actually appears to be doing very well in regard to her lateral malleolus or ulcers bilaterally. She has been tolerating the dressing changes without complication. Fortunately there does not appear to be any evidence of infection at this time. Overall I feel like she is improving in an excellent manner and I'm  very pleased with the fact that everything seems to be turning towards the better for her. This has obviously been a long road. 09/27/18 on evaluation today patient actually appears to be doing very well in regard to her bilateral lateral malleolus ulcers. She has been tolerating the dressing changes without complication. Fortunately there does not appear to be any evidence of infection at this time which is also great news. No fevers, chills, nausea, or vomiting noted at this time. Overall I feel like she is doing excellent with the snap vac on the left malleolus. She had 40 mL of fluid collection over the past week. 10/04/18 on evaluation today patient actually appears to be doing well in regard to her bilateral lateral malleolus ulcers. She continues to tolerate the dressing changes without complication. One issue that I see is the snap vac on the left lateral malleolus which appears to have sealed off some fluid underlying this area and has not really allowed it to heal to the degree that I would like to see. For that reason I did suggest at this point we may want to pack a small piece of packing strip into this region to allow it to more effectively wick out fluid. 10/11/18 in general the patient today does not feel that she has been doing very well. She's been a little bit lethargic and subsequently is having bodyaches as well according to what she tells me today. With that being said overall she has been concerned with the fact that something may be worsening although to be honest her wounds really have not been appearing poorly. She does have a new ulcer on her left heel unfortunately. This may be pressure related. Nonetheless it seems to me to have potentially started at least as a blister I do not see any evidence of deep tissue injury. In regard to the left ankle the snap vac still seems to be causing the ceiling off of the deeper part of the wound which is in turn trapping fluid. I'm not  extremely pleased with the overall appearance as far as progress from last week to this week therefore I'm gonna discontinue the snap vac at this point. 10/18/18 patient unfortunately this point has not been feeling well for the past several days. She was seen by Grayland Ormond her primary care provider who is a Librarian, academic at [facility]. Subsequently she states that she's been very weak and generally feeling malaise. No fevers, chills, nausea, or vomiting noted at this time. With that being said bloodwork was performed at the PCP office on the 11th of  this month which showed a white blood cell count of 10.7. This was repeated today and shows a white blood cell count of 12.4. This does show signs of worsening. Coupled with the fact that she is feeling worse and that her left ankle wound is not really showing signs of improvement I feel like this is an indication that the osteomyelitis is likely exacerbating not improving. Overall I think we may also want to check her C-reactive protein and sedimentation rate. Actually did call [facility] office this afternoon while the patient was in the office here with me. Subsequently based on the findings we discussed treatment possibilities and I think that it is appropriate for [place] to go ahead and initiate treatment with doxycycline which I'm going to do. Subsequently he did agree to see about adding a CRP and sedimentation rate to her orders. If that has not already been drawn to where they can run it they will contact the patient she can come back to have that check. They are in agreement with plan as far as the patient and her daughter are concerned. Nonetheless also think we need to get in touch with Dr. Henreitta Leber office to see about getting the patient scheduled with him as soon as possible. 11/08/18 on evaluation today patient presents for follow-up concerning her bilateral foot and ankle ulcers. I did do an extensive review of her chart in  epic today. Subsequently she was seen by [name] he did initiate Cefepime IV antibiotic therapy. Subsequently she had some issues with her PICC line this had to be removed because it was coiled and then replaced. Fortunately that was now settled. Unfortunately she has continued have issues with her left heel as well as the issues that she is experiencing with her bilateral lateral malleolus regions. I do believe however both areas seem to be doing a little bit better on evaluation today which is good news. No fevers, chills, nausea, or vomiting noted at this time. She actually has an angiogram schedule with Dr. dew on this coming Monday, November 11, 2018. Subsequently the patient states that she is feeling much better especially than what she was roughly 2 weeks ago. She actually had to cancel an appointment because she was feeling so poorly. No fevers, chills, nausea, or vomiting noted at this time. 11/15/18 on evaluation today patient actually is status post having had her angiogram with Dr. dew Monday, four days ago. 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 He had to go and work on several areas of the vasculature fortunately he was able to obtain no more than a 30% residual stenosis throughout post procedure. I reviewed this note today. I think this will definitely help with healing at this time. Fortunately there does not appear to be any signs of infection and I do feel like ratio already has a better appearance to it. 11/22/18 upon evaluation today patient actually appears to be doing very well in regard to her wounds in general. The right lateral malleolus looks excellent the heel looks better in the left lateral malleolus also appears to be doing a little better. With that being said the right second toe actually appears to be open and training we been watching this is been dry and stable but now is open. 12/03/2018 Seen today for follow-up and management of  multiple bilateral lower extremity wounds. New pressure injury of the great toe which is closed at this time. Wound of the right  distal second toe appears larger today with deep undermining and a pocket of fluid present within the undermining region. Left and right malleolus is wounds are stable today with no signs and symptoms of infection.Denies any needs or concerns during exam today. 12/13/18 on evaluation today patient appears to be doing somewhat better in regard to her left heel ulcer. She also seems to be completely healed in regard to the right lateral malleolus ulcer. The left malleolus ulcer is smaller what unfortunately the wounds which are new over the first and second toes of the right foot are what are most concerning at this point especially the second. Both areas did require sharp debridement today. 12/20/18 on evaluation today patient's wound actually appears to be doing better in regard to left lateral ankle and her right lateral ankle continues to remain healed. The hill ulcer on the left is improved. She does have improvement noted as well in regard to both toe ulcers. Overall I'm very pleased in this regard. No fevers, chills, nausea, or vomiting noted at this time. CORDELL, GOLUB ([id number]) 12/23/18 on evaluation today patient is seen after she had her toenails trimmed at the podiatrist office due to issues with her right great toe. There was what appeared to be dark eschar on the surface of the wound which had her in the podiatrist concerned. Nonetheless as I remember that during the last office visit I had utilize silver nitrate of this area I was much less concerned about the situation. Subsequently I was able to clean off much of this tissue without any complication today. This does not appear to show any signs of infection and actually look somewhat better compared to last time post debridement. Her second toe on the right foot actually had callous over and there did  appear still be some fluid underneath this that would require debridement today. 12/27/18 on evaluation today patient actually appears to be showing signs of improvement at all locations. Even the left lateral ankle although this is not quite as great as the other sites. Fortunately there does not appear to be any signs of infection at this time and both of her toes on the right foot seem to be showing signs of improvement which is good news and very pleased in this regard. 01/03/19 on evaluation today patient appears to be doing better for the most part in regard to her wounds in particular. There does not appear to be any evidence of infection at this time which is good news. Fortunately there is no sign of really worsening anywhere except for the right great toe which she does have what appears to be a bruise/deep tissue injury which is very superficial and already resolving. I'm not sure where this came from I questioned her extensively and she does not recall what may have happened with this. Other than that the patient seems to be doing well even the left lateral ankle ulcer looks good and is getting smaller. 01/10/19 on evaluation today patient appears to be doing well in regard to her left heel wound and both of her toe wounds. Overall I feel like there is definitely improvement here and I'm happy in that regard. With that being said unfortunately she is having issues with the left lateral malleolus ulcer which unfortunately still has a lot of depth to it. This is gonna be a very difficult wound for [place] to be able to truly get to heal. I may want to consider some type of skin substitute to  see if this would be of benefit for her. I'll discuss this with her more the next visit most likely. This was something I thought about more at the end of the visit when I was Artie out of the room and the patient had been discharged. 01/17/19 on evaluation today patient appears to be doing very well in regard to  her wounds in general. She's been making excellent progress at this time. Fortunately there's no sign of infection at this time either. No fevers, chills, nausea, or vomiting noted at this time. The biggest issue is still her left lateral malleolus where it appears to be doing well and is getting smaller but still shows a small corner where this is deeper and goes down into what appears to be the joint space. Nonetheless this is taking much longer to heal although it still looks better in smaller than previous evaluations. 01/24/19 on evaluation today patient's wounds actually appear to be doing rather well in general overall. She did require some sharp debridement in regard to the right great toe but everything else appears to be doing excellent no debridement was even necessary. No fevers, chills, nausea, or vomiting noted at this time. 01/31/19 on evaluation today patient actually appears to be doing much better in regard to her left foot wound on the heel as well as the ankle. The right great toe appears to be a little bit worse today this had callous over and trapped a lot of fluid underneath. Fortunately there's no signs of infection at any site which is great news. 02/07/19 on evaluation today patient actually appears to be doing decently well in regard to all of her ulcers at this point. No sharp debridement was required she is a little bit of hyper granulation in regard to the left lateral ankle as well as the left heel but the hill itself is almost completely healed which is excellent news. Overall been very pleased in this regard. 02/14/19 on evaluation today patient actually appears to be doing very well in regard to her ulcers on the right first toe, left lateral malleolus, and left heel. In fact the heel is almost completely healed at this point. The patient does not show any signs of infection which is good news. Overall very pleased with how things have progressed. 04/18/19 Telehealth  Evaluation During the COVID-19 National Emergency: Verbal Consent: Obtained from patient Allergies: reviewed and the active list is current. Medication changes: patient has no current medication changes. COVID-19 Screening: 1. Have you traveled internationally or on a cruise ship in the last 14 dayso No 2. Have you had contact with someone with or under investigation for COVID-19o No 3. Have you had a fever, cough, sore throat, or experiencing shortness of breatho No on evaluation today actually did have a visit with this patient through a telehealth encounter with her home health nurse. Subsequently it was noted that the patient actually appears to be doing okay in regard to her wounds both the right great toe as well as the left lateral malleolus have shown signs of improvement although this in your theme around the left lateral malleolus there eschar coverings for both locations. The question is whether or not they are actually close and whether or not home health needs to discharge the patient or not. Nonetheless my concern is this point obviously is that without actually seeing her and being able to evaluate this directly I cannot ensure that she is completely healed which is the question that I'm  being asked. 04/22/19 on evaluation today patient presents for her first evaluation since last time I saw her which was actually February 14, 2019. I did do a telehealth visit last week in which point it was questionable whether or not she may be healed and had to bring her in today for confirmation. With that being said she does seem to be doing quite well at this point which is good news. There does not appear to be any drainage in the deed I believe her wounds may be healed. Readmission: 09/04/2019 on evaluation today patient appears to be doing unfortunately somewhat more poorly in regard to her left foot ulcer secondary to a wound that began on 08/21/2019 at least when she first noticed this.  Fortunately she has not had any evidence of active infection at this time. Systemically. I also do not necessarily see any evidence of infection at the blister/wound site on the first metatarsal head plantar aspect. This almost appears to be something that may have just rubbed inappropriately causing this to breakdown. They did not want a wait too long to come in to be seen as again she had significant issues in the past with wounds that took quite a while to heal in fact it was close to 2 years. Nonetheless this does not appear to be quite that bad but again we do need to remove some of the necrotic tissue from the surface of the wound to tell exactly the extent. She does not appear to have any significant arterial disease at this point and again her last ABIs and TBI's are recorded above in the alert section her left ABI was 1.27 with a TBI of 0.72 to the right ABI 1.08 with a TBI of 0.39. Other than this the patient has been doing quite well since I last saw her and that was in May 2020. ROSELYNNE, DEVOSS ([id number]) 09/11/2019 on evaluation today patient appeared to be doing very well with regard to her plantar foot ulcer on the left. In fact this appears to be almost completely healed which is awesome. That is after just 1 week of intervention. With that being said there is no signs of active infection at this time. 09/18/2019 on evaluation today patient actually appears to be doing excellent in fact she is completely healed based on what I am seeing at this point. Fortunately there is no signs of active infection at this time and overall patient is very pleased to hear that this area has healed so quickly. Readmission: 05/13/2021 upon evaluation today this patient presents for reevaluation here in the clinic. This is a wound that actually we previously took care of. She had 1 on the right ankle and the left the left turned out to be be harder due to to heal but nonetheless is doing great at  this point as the right that has reopened and it was noted first just several weeks ago with a scab over it and came off in just the past few days. Fortunately there does not appear to be any obvious evidence of significant active infection at this time which is great news. No fevers, chills, nausea, vomiting, or diarrhea. 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 There does not appear to be any signs of this interfering in any way with her wound. She does have swelling we previously had compression socks for her ordered but again it does not look like she wears these on  a regular basis by any means. 05/26/2021 upon evaluation today patient appears to be doing well with regard to her wound which is actually showing signs of excellent improvement. There does not appear to be any signs of active infection which is great news and overall very pleased with where things stand today. No fevers, chills, nausea, vomiting, or diarrhea. 06/02/2021 upon evaluation today patient's wound actually showing signs of excellent improvement. Fortunately there does not appear to be any signs of active infection which is great news. I think the patient is making good progress with regard to her wounds in general. 06/09/2021 upon evaluation today patient appears to be doing excellent in regard to her wounds currently. Fortunately there does not appear to be any signs of active infection which is great news. No fevers, chills, nausea, vomiting, or diarrhea. Overall extremely pleased with where things stand today. I think the patient is making excellent progress. 06/16/2021 upon evaluation today patient appears to be doing well in regard to her wound. This is going require little bit of debridement today and that was discussed with the patient. Otherwise she seems to be doing quite well and I am actually very pleased with where things stand at this point. No fevers, chills,  nausea, vomiting, or diarrhea. 06/23/2021 upon evaluation today patient appears to be doing well with regard to her wounds. She has been tolerating the dressing changes without complication. Fortunately there does not appear to be any evidence of infection and she has not had air in her home which she actually lives at an assisted living that got fixed this morning. With that being said because of that her wrap has been extremely hot and bothersome for her over the past week. 06/30/2021 upon evaluation today patient is actually making excellent progress in regard to her ankle ulcer. She has been tolerating the dressing changes without complication and overall extremely pleased with where things stand there does not appear to be any evidence of active infection which is great news. No fevers, chills, nausea, vomiting, or diarrhea. 07/07/21 upon evaluation today patients and culture on the right actually appears to be doing quite well. There does not appear to be any signs of infection and overall very pleased with where things stand today. No fevers, chills, nausea, or vomiting noted at this time. 07/14/2021 unfortunately the patient today has some evidence of deep tissue injury and pressure getting to the ankle region. Again I am not exactly sure what is going on here but this is very similar to issues that we have had in the past. I explained to the patient that she needs to be very mindful of exactly what is happening I think sleeping in bed is probably the main issue here although there could be other culprits I am not sure what else would potentially lead to this kind of a problem for her. 07/21/2021 upon evaluation today patient's wound actually showing signs of improvement compared to last week. Fortunately there does not appear to be any signs of active infection which is great news and overall very pleased with where things stand in that regard. With that being said I do believe that she is  continuing to show signs of overall of getting better although I think this is still basically about what we were 2 weeks ago due to the worsening and now improvement. Objective Constitutional Well-nourished and well-hydrated in no acute distress. Vitals Time Taken: 2:32 PM, Height: 62 in, Weight: 150 lbs, BMI: 27.4, Temperature: 98.4 F,  Pulse: 78 bpm, Respiratory Rate: 18 breaths/min, Blood Pressure: 126/74 mmHg. Respiratory normal breathing without difficulty. Psychiatric this patient is able to make decisions and demonstrates good insight into disease process. Alert and Oriented x 3. pleasant and cooperative. LU, HASEK ([id number]) General Notes: Upon inspection patient's wound bed actually showed signs of good granulation and epithelization with some slough noted I did perform sharp debridement to clear away some of the necrotic debris she tolerated that today without complication and postdebridement the wound bed appears to be doing much better which is great news. Integumentary (Hair, Skin) Wound #7 status is Open. Original cause of wound was Gradually Appeared. The date acquired was: 05/12/2021. The wound has been in treatment 9 weeks. The wound is located on the Right,Lateral Malleolus. The wound measures 0.9cm length x 0.7cm width x 0.2cm depth; 0.495cm^2 area and 0.099cm^3 volume. There is Fat Layer (Subcutaneous Tissue) exposed. There is no tunneling or undermining noted. There is a medium amount of serosanguineous drainage noted. The wound margin is distinct with the outline attached to the wound base. There is large (67-100%) red, pink, pale granulation within the wound bed. There is a small (1-33%) amount of necrotic tissue within the wound bed including Adherent Slough. Assessment Active Problems ICD-10 Pressure ulcer of right ankle, stage 3 Lymphedema, not elsewhere classified Venous insufficiency (chronic) (peripheral) Presence of cardiac pacemaker Long term  (current) use of anticoagulants Procedures Wound #7 Pre-procedure diagnosis of Wound #7 is a Pressure Ulcer located on the Right,Lateral Malleolus . There was a Excisional Skin/Subcutaneous Tissue Debridement with a total area of 0.63 sq cm performed by Tommie Sams., PA-C. With the following instrument(s): Curette to remove Viable and Non-Viable tissue/material. Material removed includes Subcutaneous Tissue, Slough, Skin: Dermis, and Skin: Epidermis after achieving pain control using Lidocaine 4% Topical Solution. No specimens were taken. A time out was conducted at 14:37, prior to the start of the procedure. A Minimum amount of bleeding was controlled with Pressure. The procedure was tolerated well with a pain level of 0 throughout and a pain level of 0 following the procedure. Post Debridement Measurements: 0.9cm length x 0.7cm width x 0.2cm depth; 0.099cm^3 volume. Post debridement Stage noted as Category/Stage III. Character of Wound/Ulcer Post Debridement is improved. Post procedure Diagnosis Wound #7: Same as Pre-Procedure Plan Follow-up Appointments: Return Appointment in 1 week. Bathing/ Shower/ Hygiene: May shower with wound dressing protected with water repellent cover or cast protector. Edema Control - Lymphedema / Segmental Compressive Device / Other: Optional: One layer of unna paste to top of compression wrap (to act as an anchor). - and at toes Elevate, Exercise Daily and Avoid Standing for Long Periods of Time. Elevate legs to the level of the heart and pump ankles as often as possible Elevate leg(s) parallel to the floor when sitting. WOUND #7: - Malleolus Wound Laterality: Right, Lateral Cleanser: Soap and Water 1 x Per Week/30 Days Discharge Instructions: Gently cleanse wound with antibacterial soap, rinse and pat dry prior to dressing wounds Primary Dressing: Prisma 4.34 (in) 1 x Per Week/30 Days Discharge Instructions: Moisten w/normal saline or sterile water; Cover  wound as directed. Do not remove from wound bed. Primary Dressing: Curad Oil Emulsion Dressing 3x3 (in/in) 1 x Per Week/30 Days Discharge Instructions: over prisma Secondary Dressing: ABD Pad 5x9 (in/in) 1 x Per Week/30 Days Discharge Instructions: Cover with ABD pad Compression Wrap: Profore Lite LF 3 Multilayer Compression Bandaging System 1 x Per Week/30 Days Discharge Instructions: Apply 3 multi-layer wrap  as prescribed. ANTANESHA, DEGRASSE ([id number]) 1. Would recommend currently that we going continue with wound care measures as before and the patient is in agreement the plan that includes the use of the silver collagen dressing which I think is doing a good job. 2. Also use the oil emulsion dressing to cover. 3. I am also can recommend the patient continue with the 3 layer compression wrap. 4. She will continue with her Velcro compression wrap on the left leg. We will see patient back for reevaluation in 1 week here in the clinic. If anything worsens or changes patient will contact our office for additional recommendations. Electronic Signature(s) Signed: 07/21/2021 2:48:16 PM By: Worthy Keeler PA-C Entered By: Worthy Keeler on 07/21/2021 14:48:16 Goar, [name] ([id number]) -------------------------------------------------------------------------------- SuperBill Details Patient Name: [name] Date of Service: 07/21/2021 Medical Record Number: [id number] Patient Account Number: [id number] Date of Birth/Sex: [birth year] (87 y.o. F) Treating RN: Carlene Coria Primary Care Provider: Adrian Prows Other Clinician: Referring Provider: Adrian Prows Treating Provider/Extender: Skipper Cliche in Treatment: 9 Diagnosis Coding ICD-10 Codes Code Description [phone] Pressure ulcer of right ankle, stage 3 I89.0 Lymphedema, not elsewhere classified I87.2 Venous insufficiency (chronic) (peripheral) Z95.0 Presence of cardiac pacemaker Z79.01 Long term (current)  use of anticoagulants Facility Procedures CPT4 Code: [id number] Description: [id number] - DEB SUBQ TISSUE 20 SQ CM/< Modifier: Quantity: 1 CPT4 Code: Description: ICD-10 Diagnosis Description 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, stage 3 Modifier: Quantity: Physician Procedures CPT4 Code: [id number] Description: [id number] - WC PHYS SUBQ TISS 20 SQ CM Modifier: Quantity: 1 CPT4 Code: Description: ICD-10 Diagnosis Description 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, stage 3 Modifier: Quantity: Electronic Signature(s) Signed: 07/21/2021 2:48:32 PM By: Worthy Keeler PA-C Entered By: Worthy Keeler on 07/21/2021 14:48:32

## 2021-07-21 NOTE — Progress Notes (Addendum)
[name], [name] ([id number]) Visit Report for 07/21/2021 Arrival Information Details Patient Name: [name], [name] Date of Service: 07/21/2021 2:15 PM Medical Record Number: [id number] Patient Account Number: [id number] Date of Birth/Sex: 1923-10-09 (87 y.o. F) Treating RN: Carlene Coria Primary Care Jenel Gierke: Adrian Prows Other Clinician: Referring Mariam Helbert: Adrian Prows Treating Macguire Holsinger/Extender: Skipper Cliche in Treatment: 9 Visit Information History Since Last Visit All ordered tests and consults were completed: No Patient Arrived: [name] Added or deleted any medications: No Arrival Time: 14:13 Any new allergies or adverse reactions: No Accompanied By: daughter Had a fall or experienced change in No Transfer Assistance: None activities of daily living that may affect Patient Identification Verified: Yes risk of falls: Secondary Verification Process Completed: Yes Signs or symptoms of abuse/neglect since last visito No Patient Requires Transmission-Based No Hospitalized since last visit: No Precautions: Implantable device outside of the clinic excluding No Patient Has Alerts: Yes cellular tissue based products placed in the center Patient Alerts: Patient on Blood since last visit: Thinner Has Dressing in Place as Prescribed: Yes NOT DIABETIC Has Compression in Place as Prescribed: Yes ***ELIQUIS*** Pain Present Now: No Electronic Signature(s) Signed: 07/21/2021 5:07:13 PM By: Carlene Coria RN Entered By: Carlene Coria on 07/21/2021 14:31:58 [name], [name] ([id number]) -------------------------------------------------------------------------------- Clinic Level of Care Assessment Details Patient Name: [name] Date of Service: 07/21/2021 2:15 PM Medical Record Number: [id number] Patient Account Number: [id number] Date of Birth/Sex: 09-Jun-1923 (87 y.o. F) Treating RN: Carlene Coria Primary Care Doreena Maulden: Adrian Prows Other  Clinician: Referring Tyrae Alcoser: Adrian Prows Treating Kiaira Pointer/Extender: Skipper Cliche in Treatment: 9 Clinic Level of Care Assessment Items TOOL 1 Quantity Score '[]'  - Use when EandM and Procedure is performed on INITIAL visit 0 ASSESSMENTS - Nursing Assessment / Reassessment '[]'  - General Physical Exam (combine w/ comprehensive assessment (listed just below) when performed on new 0 pt. evals) '[]'  - 0 Comprehensive Assessment (HX, ROS, Risk Assessments, Wounds Hx, etc.) ASSESSMENTS - Wound and Skin Assessment / Reassessment '[]'  - Dermatologic / Skin Assessment (not related to wound area) 0 ASSESSMENTS - Ostomy and/or Continence Assessment and Care '[]'  - Incontinence Assessment and Management 0 '[]'  - 0 Ostomy Care Assessment and Management (repouching, etc.) PROCESS - Coordination of Care '[]'  - Simple Patient / Family Education for ongoing care 0 '[]'  - 0 Complex (extensive) Patient / Family Education for ongoing care '[]'  - 0 Staff obtains Programmer, systems, Records, Test Results / Process Orders '[]'  - 0 Staff telephones HHA, Nursing Homes / Clarify orders / etc '[]'  - 0 Routine Transfer to another Facility (non-emergent condition) '[]'  - 0 Routine Hospital Admission (non-emergent condition) '[]'  - 0 New Admissions / Biomedical engineer / Ordering NPWT, Apligraf, etc. '[]'  - 0 Emergency Hospital Admission (emergent condition) PROCESS - Special Needs '[]'  - Pediatric / Minor Patient Management 0 '[]'  - 0 Isolation Patient Management '[]'  - 0 Hearing / Language / Visual special needs '[]'  - 0 Assessment of Community assistance (transportation, D/C planning, etc.) '[]'  - 0 Additional assistance / Altered mentation '[]'  - 0 Support Surface(s) Assessment (bed, cushion, seat, etc.) INTERVENTIONS - Miscellaneous '[]'  - External ear exam 0 '[]'  - 0 Patient Transfer (multiple staff / Civil Service fast streamer / Similar devices) '[]'  - 0 Simple Staple / Suture removal (25 or less) '[]'  - 0 Complex Staple / Suture removal  (26 or more) '[]'  - 0 Hypo/Hyperglycemic Management (do not check if billed separately) '[]'  - 0 Ankle / Brachial Index (ABI) - do not check if billed separately Has the  patient been seen at the hospital within the last three years: Yes Total Score: 0 Level Of Care: ____ [name] ([id number]) Electronic Signature(s) Signed: 07/21/2021 5:07:13 PM By: Carlene Coria RN Entered By: Carlene Coria on 07/21/2021 14:42:32 [name], [name] ([id number]) -------------------------------------------------------------------------------- Encounter Discharge Information Details Patient Name: [name] Date of Service: 07/21/2021 2:15 PM Medical Record Number: [id number] Patient Account Number: [id number] Date of Birth/Sex: 1923-03-02 (87 y.o. F) Treating RN: Carlene Coria Primary Care Zeth Buday: Adrian Prows Other Clinician: Referring Nawaal Alling: Adrian Prows Treating Corrado Hymon/Extender: Skipper Cliche in Treatment: 9 Encounter Discharge Information Items Post Procedure Vitals Discharge Condition: Stable Temperature (F): 98.4 Ambulatory Status: Walker Pulse (bpm): 78 Discharge Destination: Home Respiratory Rate (breaths/min): 18 Transportation: Private Auto Blood Pressure (mmHg): 126/74 Accompanied By: daughter Schedule Follow-up Appointment: Yes Clinical Summary of Care: Patient Declined Electronic Signature(s) Signed: 07/21/2021 5:07:13 PM By: Carlene Coria RN Entered By: Carlene Coria on 07/21/2021 14:44:05 [name], [name] ([id number]) -------------------------------------------------------------------------------- Lower Extremity Assessment Details Patient Name: [name] Date of Service: 07/21/2021 2:15 PM Medical Record Number: [id number] Patient Account Number: [id number] Date of Birth/Sex: June 21, 1923 (87 y.o. F) Treating RN: Carlene Coria Primary Care Braun Rocca: Adrian Prows Other Clinician: Referring Toiya Morrish: Adrian Prows Treating  Zyshonne Malecha/Extender: Skipper Cliche in Treatment: 9 Edema Assessment Assessed: [Left: No] [Right: No] Edema: [Left: Ye] [Right: s] Calf Left: Right: Point of Measurement: 33 cm From Medial Instep 29 cm Ankle Left: Right: Point of Measurement: 10 cm From Medial Instep 17 cm Vascular Assessment Pulses: Dorsalis Pedis Palpable: [Right:Yes] Electronic Signature(s) Signed: 07/21/2021 5:07:13 PM By: Carlene Coria RN Entered By: Carlene Coria on 07/21/2021 14:45:49 Hobart, [name] ([id number]) -------------------------------------------------------------------------------- Multi Wound Chart Details Patient Name: [name] Date of Service: 07/21/2021 2:15 PM Medical Record Number: [id number] Patient Account Number: [id number] Date of Birth/Sex: 1923-02-28 (87 y.o. F) Treating RN: Carlene Coria Primary Care Cherese Lozano: Adrian Prows Other Clinician: Referring Madyx Delfin: Adrian Prows Treating Mikaylee Arseneau/Extender: Skipper Cliche in Treatment: 9 Vital Signs Height(in): 62 Pulse(bpm): 90 Weight(lbs): 150 Blood Pressure(mmHg): 126/74 Body Mass Index(BMI): 27 Temperature(F): 98.4 Respiratory Rate(breaths/min): 18 Photos: [N/A:N/A] Wound Location: Right, Lateral Malleolus N/A N/A Wounding Event: Gradually Appeared N/A N/A Primary Etiology: Pressure Ulcer N/A N/A Comorbid History: Cataracts, Congestive Heart Failure, N/A N/A Hypertension, Peripheral Arterial Disease, Osteoarthritis, Neuropathy Date Acquired: 05/12/2021 N/A N/A Weeks of Treatment: 9 N/A N/A Wound Status: Open N/A N/A Measurements L x W x D (cm) 0.9x0.7x0.2 N/A N/A Area (cm) : 0.495 N/A N/A Volume (cm) : 0.099 N/A N/A % Reduction in Area: 55.90% N/A N/A % Reduction in Volume: 56.00% N/A N/A Classification: Category/Stage III N/A N/A Exudate Amount: Medium N/A N/A Exudate Type: Serosanguineous N/A N/A Exudate Color: red, brown N/A N/A Wound Margin: Distinct, outline attached N/A N/A Granulation  Amount: Large (67-100%) N/A N/A Granulation Quality: Red, Pink, Pale N/A N/A Necrotic Amount: Small (1-33%) N/A N/A Exposed Structures: Fat Layer (Subcutaneous Tissue): N/A N/A Yes Fascia: No Tendon: No Muscle: No Joint: No Bone: No Epithelialization: Small (1-33%) N/A N/A Treatment Notes Electronic Signature(s) Signed: 07/21/2021 5:07:13 PM By: Carlene Coria RN Entered By: Carlene Coria on 07/21/2021 14:40:21 Easom, [name] ([id number]) -------------------------------------------------------------------------------- Multi-Disciplinary Care Plan Details Patient Name: [name] Date of Service: 07/21/2021 2:15 PM Medical Record Number: [id number] Patient Account Number: [id number] Date of Birth/Sex: 1923-10-12 (87 y.o. F) Treating RN: Carlene Coria Primary Care Pedram Goodchild: Adrian Prows Other Clinician: Referring Nickie Deren: Adrian Prows Treating Kaitlan Bin/Extender: Skipper Cliche in Treatment: 9 Active Inactive Wound/Skin Impairment  Nursing Diagnoses: Knowledge deficit related to ulceration/compromised skin integrity Goals: Patient/caregiver will verbalize understanding of skin care regimen Date Initiated: 05/13/2021 Date Inactivated: 05/13/2021 Target Resolution Date: 06/12/2021 Goal Status: Met Ulcer/skin breakdown will have a volume reduction of 30% by week 4 Date Initiated: 05/13/2021 Date Inactivated: 06/16/2021 Target Resolution Date: 06/12/2021 Goal Status: Met Ulcer/skin breakdown will have a volume reduction of 50% by week 8 Date Initiated: 05/13/2021 Target Resolution Date: 07/13/2021 Goal Status: Active Ulcer/skin breakdown will have a volume reduction of 80% by week 12 Date Initiated: 05/13/2021 Target Resolution Date: 08/13/2021 Goal Status: Active Ulcer/skin breakdown will heal within 14 weeks Date Initiated: 05/13/2021 Target Resolution Date: 09/12/2021 Goal Status: Active Interventions: Assess patient/caregiver ability to obtain necessary  supplies Assess patient/caregiver ability to perform ulcer/skin care regimen upon admission and as needed Assess ulceration(s) every visit Notes: Electronic Signature(s) Signed: 07/21/2021 5:07:13 PM By: Carlene Coria RN Entered By: Carlene Coria on 07/21/2021 14:39:53 Zahner, [name] ([id number]) -------------------------------------------------------------------------------- Pain Assessment Details Patient Name: [name] Date of Service: 07/21/2021 2:15 PM Medical Record Number: [id number] Patient Account Number: [id number] Date of Birth/Sex: 1923-10-14 (87 y.o. F) Treating RN: Carlene Coria Primary Care Devanny Palecek: Adrian Prows Other Clinician: Referring Eliaz Fout: Adrian Prows Treating Rayonna Heldman/Extender: Skipper Cliche in Treatment: 9 Active Problems Location of Pain Severity and Description of Pain Patient Has Paino No Site Locations Pain Management and Medication Current Pain Management: Electronic Signature(s) Signed: 07/21/2021 5:07:13 PM By: Carlene Coria RN Entered By: Carlene Coria on 07/21/2021 14:32:59 Moffatt, [name] ([id number]) -------------------------------------------------------------------------------- Patient/Caregiver Education Details Patient Name: [name] Date of Service: 07/21/2021 2:15 PM Medical Record Number: [id number] Patient Account Number: [id number] Date of Birth/Gender: [birth year] (87 y.o. F) Treating RN: Carlene Coria Primary Care Physician: Adrian Prows Other Clinician: Referring Physician: Adrian Prows Treating Physician/Extender: Skipper Cliche in Treatment: 9 Education Assessment Education Provided To: Patient Education Topics Provided Wound/Skin Impairment: Methods: Explain/Verbal Responses: State content correctly Electronic Signature(s) Signed: 07/21/2021 5:07:13 PM By: Carlene Coria RN Entered By: Carlene Coria on 07/21/2021 14:43:06 Durkin, [name]  ([id number]) -------------------------------------------------------------------------------- Wound Assessment Details Patient Name: [name] Date of Service: 07/21/2021 2:15 PM Medical Record Number: [id number] Patient Account Number: [id number] Date of Birth/Sex: 11-Mar-1923 (87 y.o. F) Treating RN: Carlene Coria Primary Care Edynn Gillock: Adrian Prows Other Clinician: Referring Janisse Ghan: Adrian Prows Treating Armen Waring/Extender: Skipper Cliche in Treatment: 9 Wound Status Wound Number: 7 Primary Pressure Ulcer Etiology: Wound Location: Right, Lateral Malleolus Wound Open Wounding Event: Gradually Appeared Status: Date Acquired: 05/12/2021 Comorbid Cataracts, Congestive Heart Failure, Hypertension, Weeks Of Treatment: 9 History: Peripheral Arterial Disease, Osteoarthritis, Neuropathy Clustered Wound: No Photos Wound Measurements Length: (cm) 0.9 Width: (cm) 0.7 Depth: (cm) 0.2 Area: (cm) 0.495 Volume: (cm) 0.099 % Reduction in Area: 55.9% % Reduction in Volume: 56% Epithelialization: Small (1-33%) Tunneling: No Undermining: No Wound Description Classification: Category/Stage III Wound Margin: Distinct, outline attached Exudate Amount: Medium Exudate Type: Serosanguineous Exudate Color: red, brown Foul Odor After Cleansing: No Slough/Fibrino Yes Wound Bed Granulation Amount: Large (67-100%) Exposed Structure Granulation Quality: Red, Pink, Pale Fascia Exposed: No Necrotic Amount: Small (1-33%) Fat Layer (Subcutaneous Tissue) Exposed: Yes Necrotic Quality: Adherent Slough Tendon Exposed: No Muscle Exposed: No Joint Exposed: No Bone Exposed: No Treatment Notes Wound #7 (Malleolus) Wound Laterality: Right, Lateral Cleanser Soap and Water Discharge Instruction: Gently cleanse wound with antibacterial soap, rinse and pat dry prior to dressing wounds Peri-Wound Care Recinos, [name] ([id number]) Topical Primary Dressing Prisma 4.34  (in) Discharge Instruction: Moisten w/normal  saline or sterile water; Cover wound as directed. Do not remove from wound bed. Curad Oil Emulsion Dressing 3x3 (in/in) Discharge Instruction: over prisma Secondary Dressing ABD Pad 5x9 (in/in) Discharge Instruction: Cover with ABD pad Secured With Compression Wrap Profore Lite LF 3 Multilayer Compression Bandaging System Discharge Instruction: Apply 3 multi-layer wrap as prescribed. Compression Stockings Add-Ons Electronic Signature(s) Signed: 07/21/2021 5:07:13 PM By: Carlene Coria RN Entered By: Carlene Coria on 07/21/2021 14:34:07 Batton, [name] ([id number]) -------------------------------------------------------------------------------- Vitals Details Patient Name: [name] Date of Service: 07/21/2021 2:15 PM Medical Record Number: [id number] Patient Account Number: [id number] Date of Birth/Sex: May 12, 1923 (87 y.o. F) Treating RN: Carlene Coria Primary Care Thimothy Barretta: Adrian Prows Other Clinician: Referring Braylynn Lewing: Adrian Prows Treating Worley Radermacher/Extender: Skipper Cliche in Treatment: 9 Vital Signs Time Taken: 14:32 Temperature (F): 98.4 Height (in): 62 Pulse (bpm): 78 Weight (lbs): 150 Respiratory Rate (breaths/min): 18 Body Mass Index (BMI): 27.4 Blood Pressure (mmHg): 126/74 Reference Range: 80 - 120 mg / dl Electronic Signature(s) Signed: 07/21/2021 5:07:13 PM By: Carlene Coria RN Entered By: Carlene Coria on 07/21/2021 14:32:17

## 2021-07-28 ENCOUNTER — Other Ambulatory Visit: Payer: Self-pay

## 2021-07-28 ENCOUNTER — Encounter: Payer: Medicare Other | Admitting: Physician Assistant

## 2021-07-28 DIAGNOSIS — L89513 Pressure ulcer of right ankle, stage 3: Secondary | ICD-10-CM | POA: Diagnosis not present

## 2021-07-28 NOTE — Progress Notes (Addendum)
[name], [name] ([id number]) Visit Report for 07/28/2021 Chief Complaint Document Details Patient Name: [name]. Date of Service: 07/28/2021 11:00 AM Medical Record Number: [id number] Patient Account Number: [id number] Date of Birth/Sex: August 02, 1923 (87 y.o. F) Treating RN: Carlene Coria Primary Care Provider: Adrian Prows Other Clinician: Referring Provider: Adrian Prows Treating Provider/Extender: Skipper Cliche in Treatment: 10 Information Obtained from: Patient Chief Complaint Right foot ulcer Electronic Signature(s) Signed: 07/28/2021 11:18:45 AM By: Worthy Keeler PA-C Entered By: Worthy Keeler on 07/28/2021 11:18:45 [name], [name] ([id number]) -------------------------------------------------------------------------------- Debridement Details Patient Name: [name] Date of Service: 07/28/2021 11:00 AM Medical Record Number: [id number] Patient Account Number: [id number] Date of Birth/Sex: 1923/05/19 (87 y.o. F) Treating RN: Carlene Coria Primary Care Provider: Adrian Prows Other Clinician: Referring Provider: Adrian Prows Treating Provider/Extender: Skipper Cliche in Treatment: 10 Debridement Performed for Wound #7 Right,Lateral Malleolus Assessment: Performed By: Physician Tommie Sams., PA-C Debridement Type: Debridement Level of Consciousness (Pre- Awake and Alert procedure): Pre-procedure Verification/Time Out Yes - 11:30 Taken: Start Time: 11:30 Pain Control: Lidocaine 4% Topical Solution Total Area Debrided (L x W): 0.7 (cm) x 0.6 (cm) = 0.42 (cm) Tissue and other material Viable, Non-Viable, Slough, Subcutaneous, Skin: Dermis , Skin: Epidermis, Slough debrided: Level: Skin/Subcutaneous Tissue Debridement Description: Excisional Instrument: Curette Bleeding: Minimum Hemostasis Achieved: Pressure End Time: 11:34 Procedural Pain: 0 Post Procedural Pain: 0 Response to Treatment: Procedure was tolerated  well Level of Consciousness (Post- Awake and Alert procedure): Post Debridement Measurements of Total Wound Length: (cm) 0.7 Stage: Category/Stage III Width: (cm) 0.6 Depth: (cm) 0.2 Volume: (cm) 0.066 Character of Wound/Ulcer Post Debridement: Improved Post Procedure Diagnosis Same as Pre-procedure Electronic Signature(s) Signed: 07/28/2021 3:56:40 PM By: Worthy Keeler PA-C Signed: 08/01/2021 1:47:40 PM By: Carlene Coria RN Entered By: Carlene Coria on 07/28/2021 11:35:04 [name], [name] ([id number]) -------------------------------------------------------------------------------- HPI Details Patient Name: [name] Date of Service: 07/28/2021 11:00 AM Medical Record Number: [id number] Patient Account Number: [id number] Date of Birth/Sex: [birth year] (87 y.o. 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 Dr. Amalia Hailey the podiatrist saw her on 07/20/2017 and sent her to the wound center for possible hyperbaric oxygen therapy. past medical history of peripheral vascular disease, varicose veins, status post appendectomy, basal cell carcinoma excision from the left leg, cholecystectomy, pacemaker placement, right lower extremity angiography done by Dr. dew in March 2017 with placement of a stent. there is also note of a successful ablation of the right small saphenous vein done which was reviewed by ultrasound on 10/24/2016. the patient had a right small saphenous vein ablation done on 10/20/2016. The patient has never been a smoker. She has been seen by Dr. Corene Cornea dew the vascular surgeon who most  recently saw her on 06/15/2017 for evaluation of ongoing problems with right leg swelling. She had a lower extremity arterial duplex examination done(02/13/17) which showed patent distal right superficial femoral artery stent and above-the-knee popliteal stent without evidence of restenosis. The ABI was more than 1.3 on the right and more than 1.3 on the left. This was consistent with noncompressible arteries due to medial calcification. The right great toe pressure and PPG waveforms are within normal limits and the left  great toe pressure and PPG waveforms are decreased. he recommended she continue to wear her compression stockings and continue with elevation. She is scheduled to have a noninvasive arterial study in the near future 08/16/2017 -- had a lower extremity arterial duplex examination done which showed patent distal right superficial femoral artery stent and above-the-knee popliteal stent without evidence of restenosis. The ABI was more than 1.3 on the right and more than 1.3 on the left. This was consistent with noncompressible arteries due to medial calcification. The right great toe pressure and PPG waveforms are within normal limits and the left great toe pressure and PPG waveforms are decreased. the x-ray of the right ankle has not yet been done 08/24/2017 -- had a right ankle x-ray -- IMPRESSION:1. No fracture, bone lesion or evidence of osteomyelitis. 2. Lateral soft tissue swelling with a soft tissue ulcer. she has not yet seen the vascular surgeon for review 08/31/17 on evaluation today patient's wound appears to be showing signs of improvement. She still with her appointment with vascular in order to review her results of her vascular study and then determine if any intervention would be recommended at that time. No fevers, chills, nausea, or vomiting noted at this time. She has been tolerating the dressing changes without complication. 09/28/17 on evaluation today patient's  wound appears to show signs of good improvement in regard to the granulation tissue which is surfacing. There is still a layer of slough covering the wound and the posterior portion is still significantly deeper than the anterior nonetheless there has been some good sign of things moving towards the better. She is going to go back to Dr. dew for reevaluation to ensure her blood flow is still appropriate. That will be before her next evaluation with [place] next week. No fevers, chills, nausea, or vomiting noted at this time. Patient does have some discomfort rated to be a 3-4/10 depending on activity specifically cleansing the wound makes it worse. 10/05/2017 -- the patient was seen by Dr. Lucky Cowboy last week and noninvasive studies showed a normal right ABI with brisk triphasic waveforms consistent with no arterial insufficiency including normal digital pressures. The duplex showed a patent distal right SFA stent and the proximal SFA was also normal. He was pleased with her test and thought she should have enough of perfusion for normal wound healing. He would see her back in 6 months time. 12/21/17 on evaluation today patient appears to be doing fairly well in regard to her right lateral ankle wound. Unfortunately the main issue that she is expansion at this point is that she is having some issues with what appears to be some cellulitis in the right anterior shin. She has also been noting a little bit of uncomfortable feeling especially last night and her ankle area. I'm afraid that she made the developing a little bit of an infection. With that being said I think it is in the early stages. 12/28/17 on evaluation today patient's ankle appears to be doing excellent. She's making good progress at this point the cellulitis seems to have improved after last week's evaluation. Overall she is having no significant discomfort which is excellent news. She does have an appointment with Dr. dew on March 29, 2018 for  reevaluation in regard to the stent he placed. She seems to have excellent blood flow in the right lower extremity. 01/19/12 on evaluation today patient's wound appears to be doing very well. In fact she does not appear to require debridement at this point,  there's no evidence of infection, and overall from the standpoint of the wound she seems to be doing very well. With that being said I believe that it may be time to switch to different dressing away from the [facility] Dressing she tells me she does have a lot going on her friend actually passed away yesterday and she's also having a lot of issues with her husband this obviously is weighing heavy on her as far as your thoughts and concerns today. 01/25/18 on evaluation today patient appears to be doing fairly well in regard to her right lateral malleolus. She has been tolerating the dressing changes without complication. Overall I feel like this is definitely showing signs of improvement as far as how the overall appearance of the wound is there's also evidence of epithelium start to migrate over the granulation tissue. In general I think that she is progressing nicely as far as the wound is concerned. The only concern she really has is whether or not we can switch to every other week visits in order to avoid having as many appointments as her daughters have a difficult time getting her to her appointments as well as the patient's husband to his he is not doing very well at this point. 02/22/18 on evaluation today patient's right lateral malleolus ulcer appears to be doing great. She has been tolerating the dressing changes without complication. Overall you making excellent progress at this time. Patient is having no significant discomfort. [name], [name] ([id number]) 03/15/18 on evaluation today patient appears to be doing much more poorly in regard to her right lateral ankle ulcer at this point. Unfortunately since have last seen her her  husband has passed just a few days ago is obviously weighed heavily on her her daughter also had surgery well she is with her today as usual. There does not appear to be any evidence of infection she does seem to have significant contusion/deep tissue injury to the right lateral malleolus which was not noted previous when I saw her last. It's hard to tell of exactly when this injury occurred although during the time she was spending the night in the hospital this may have been most likely. 03/22/18 on evaluation today patient appears to actually be doing very well in regard to her ulcer. She did unfortunately have a setback which was noted last week however the good news is we seem to be getting back on track and in fact the wound in the core did still have some necrotic tissue which will be addressed at this point today but in general I'm seeing signs that things are on the up and up. She is glad to hear this obviously she's been somewhat concerned that due to the how her wound digressed more recently. 03/29/18 on evaluation today patient appears to be doing fairly well in regard to her right lower extremity lateral malleolus ulcer. She unfortunately does have a new area of pressure injury over the inferior portion where the wound has opened up a little bit larger secondary to the pressure she seems to be getting. She does tell me sometimes when she sleeps at night that it actually hurts and does seem to be pushing on the area little bit more unfortunately. There does not appear to be any evidence of infection which is good news. She has been tolerating the dressing changes without complication. She also did have some bruising in the left second and third toes due to the fact that she may have  bump this or injured it although she has neuropathy so she does not feel she did move recently that may have been where this came from. Nonetheless there does not appear to be any evidence of infection at this  time. 04/12/18 on evaluation today patient's wound on the right lateral ankle actually appears to be doing a little bit better with a lot of necrotic docking tissue centrally loosening up in clearing away. However she does have the beginnings of a deep tissue injury on the left lateral malleolus likely due to the fact we've been trying offload the right as much as we have. I think she may benefit from an assistive soft device to help with offloading and it looks like they're looking at one of the doughnut conditions that wraps around the lower leg to offload which I think will definitely do a good job. With that being said I think we definitely need to address this issue on the left before it becomes a wound. Patient is not having significant pain. 04/19/18 on evaluation today patient appears to be doing excellent in regard to the progress she's made with her right lateral ankle ulcer. The left ankle region which did show evidence of a deep tissue injury seems to be resolving there's little fluid noted underneath and a blister there's nothing open at this point in time overall I feel like this is progressing nicely which is good news. She does not seem to be having significant discomfort at this point which is also good news. 04/25/18-She is here in follow up evaluation for bilateral lateral malleolar ulcers. The right lateral malleolus ulcer with pale subcutaneous tissue exposure, central area of ulcer with tendon/periosteum exposed. The left lateral malleolus ulcer now with central area of nonviable tissue, otherwise deep tissue injury. She is wearing compression wraps to the left lower extremity, she will place the right lower extremity compression wraps on when she gets home. She will be out of town over the weekend and return next week and follow-up appointment. She completed her doxycycline this morning 05/03/18 on evaluation today patient appears to be doing very well in regard to her right  lateral ankle ulcer in general. At least she's showing some signs of improvement in this regard. Unfortunately she has some additional injury to the left lateral malleolus region which appears to be new likely even over the past several days. Again this determination is based on the overall appearance. With that being said the patient is obviously frustrated about this currently. 05/10/18-She is here in follow-up evaluation for bilateral lateral malleolar ulcers. She states she has purchased offloading shoes/boots and they will arrive tomorrow. She was asked to bring them in the office at next week's appointment so her provider is aware of product being utilized. She continues to sleep on right or left side, she has been encouraged to sleep on her back. The right lateral malleolus ulcer is precariously close to peri-osteum; will order xray. The left lateral malleolus ulcer is improved. Will switch back to santyl; she will follow up next week. 05/17/18 on evaluation today patient actually appears to be doing very well in regard to her malleolus her ulcers compared to last time I saw them. She does not seem to have as much in the way of contusion at this point which is great news. With that being said she does continue to have discomfort and I do believe that she is still continuing to benefit from the offloading/pressure reducing boots that were recommended. I  think this is the key to trying to get this to heal up completely. 05/24/18 on evaluation today patient actually appears to be doing worse at this point in time unfortunately compared to her last week's evaluation. She is having really no increased pain which is good news unfortunately she does have more maceration in your theme and noted surrounding the right lateral ankle the left lateral ankle is not really is erythematous I do not see signs of the overt cellulitis on that side. Unfortunately the wounds do not seem to have shown any signs of  improvement since the last evaluation. She also has significant swelling especially on the right compared to previous some of this may be due to infection however also think that she may be served better while she has these wounds by compression wrapping versus continuing to use the Juxta-Lite for the time being. Especially with the amount of drainage that she is experiencing at this point. No fevers, chills, nausea, or vomiting noted at this time. 05/31/18 on evaluation today patient appears to actually be doing better in regard to her right lateral lower extremity ulcer specifically on the malleolus region. She has been tolerating the antibiotic without complication. With that being said she still continues to have issues but a little bit of redness although nothing like she what she was experiencing previous. She still continues to pressure to her ankle area she did get the problem on offloading boots unfortunately she will not wear them she states there too uncomfortable and she can't get in and out of the bed. Nonetheless at this point her wounds seem to be continually getting worse which is not what we want I'm getting somewhat concerned about her progress and how things are going to proceed if we do not intervene in some way shape or form. I therefore had a very lengthy conversation today about offloading yet again and even made a specific suggestion for switching her to a memory foam mattress and even gave the information for a specific one that they could look at getting if it was something that they were interested in considering. She does not want to be considered for a hospital bed air mattress although honestly insurance would not cover it that she does not have any wounds on her trunk. 06/14/18 on evaluation today both wounds over the bilateral lateral malleolus her ulcers appear to be doing better there's no evidence of pressure injury at this point. She did get the foam mattress for her  bed and this does seem to have been extremely beneficial for her in my pinion. Her daughter states that she is having difficulty getting out of bed because of how soft it is. The patient also relates this to be. Nonetheless I do feel like she's actually doing better. Unfortunately right after and around the time she was getting the mattress she also sustained a fall when she got up to go pick up the phone and ended up injuring her right elbow she has 18 sutures in place. We are not caring for this currently although home health is going to be taking the sutures out shortly. Nonetheless this may be something that we need to evaluate going forward. It depends on how well it has or has not healed in the end. She also recently saw an orthopedic specialist for an injection in the right shoulder just before her fall unfortunately the fall seems to have worsened her pain. 06/21/18 on evaluation today patient appears to be doing about  the same in regard to her lateral malleolus ulcers. Both appear to be just a little bit deeper but again we are clinging away the necrotic and dead tissue which I think is why this is progressing towards a deeper realm as opposed [name], [name]. ([id number]) to improving from my measurement standpoint in that regard. Nonetheless she has been tolerating the dressing changes she absolutely hates the memory foam mattress topper that was obtained for her nonetheless I do believe this is still doing excellent as far as taking care of excess pressure in regard to the lateral malleolus regions. She in fact has no pressure injury that I see whereas in weeks past it was week by week I was constantly seeing new pressure injuries. Overall I think it has been very beneficial for her. 07/03/18; patient arrives in my clinic today. She has deep punched out areas over her bilateral lateral malleoli. The area on the right has some more depth. We spent a lot of time today talking about pressure  relief for these areas. This started when her daughter asked for a prescription for a memory foam mattress. I have never written a prescription for a mattress and I don't think insurances would pay for that on an ordinary bed. In any case he came up that she has foam boots that she refuses to wear. I would suggest going to these before any other offloading issues when she is in bed. They say she is meticulous about offloading this the rest of the day 07/10/18- She is seen in follow-up evaluation for bilateral, lateral malleolus ulcers. There is no improvement in the ulcers. She has purchased and is sleeping on a memory foam mattress/overlay, she has been using the offloading boots nightly over the past week. She has a follow up appointment with vascular medicine at the end of October, in my opinion this follow up should be expedited given her deterioration and suboptimal TBI results. We will order plain film xray of the left ankle as deeper structures are palpable; would consider having MRI, regardless of xray report(s). The ulcers will be treated with iodoflex/iodosorb, she is unable to safely change the dressings daily with santyl. 07/19/18 on evaluation today patient appears to be doing in general visually well in regard to her bilateral lateral malleolus ulcers. She has been tolerating the dressing changes without complication which is good news. With that being said we did have an x-ray performed on 07/12/18 which revealed a slight loosen see in the lateral portion of the distal left fibula which may represent artifact but underline lytic destruction or osteomyelitis could not be excluded. MRI was recommended. With that being said we can see about getting the patient scheduled for an MRI to further evaluate this area. In fact we have that scheduled currently for August 20 19,019. 07/26/18 on evaluation today patient's wound on the right lateral ankle actually appears to be doing fairly well at this  point in my pinion. She has made some good progress currently. With that being said unfortunately in regard to the left lateral ankle ulcer this seems to be a little bit more problematic at this time. In fact as I further evaluated the situation she actually had bone exposed which is the first time that's been the case in the bone appear to be necrotic. Currently I did review patient's note from Dr. Bunnie Domino office with [facility]. He stated that ABI was 1.26 on the right and 0.95 on the left with good  waveforms. Her perfusion is stable not reduced from previous studies and her digital waveforms were pretty good particularly on the right. His conclusion upon review of the note was that there was not much she could do to improve her perfusion and he felt she was adequate for wound healing. His suggestion was that she continued to see [place] and consider a synthetic skin graft if there was no underlying infection. He plans to see her back in six months or as needed. 08/01/18 on evaluation today patient appears to be doing better in regard to her right lateral ankle ulcer. Her left lateral ankle ulcer is about the same she still has bone involvement in evidence of necrosis. There does not appear to be evidence of infection at this time On the right lateral lower extremity. I have started her on the Augmentin she picked this up and started this yesterday. This is to get her through until she sees infectious disease which is scheduled for 08/12/18. 08/06/18 on evaluation today patient appears to be doing rather well considering my discussion with patient's daughter at the end of last week. The area which was marked where she had erythema seems to be improved and this is good news. With that being said overall the patient seems to be making good improvement when it comes to the overall appearance of the right lateral ankle ulcer although this has been slow she at least is coming around in this  regard. Unfortunately in regard to the left lateral ankle ulcer this is osteomyelitis based on the pathology report as well is bone culture. Nonetheless we are still waiting CT scan. Unfortunately the MRI we originally ordered cannot be performed as the patient is a pacemaker which I had overlooked. Nonetheless we are working on the CT scan approval and scheduling as of now. She did go to the hospital over the weekend and was placed on IV Cefzo for a couple of days. Fortunately this seems to have improved the erythema quite significantly which is good news. There does not appear to be any evidence of worsening infection at this time. She did have some bleeding after the last debridement therefore I did not perform any sharp debridement in regard to left lateral ankle at this point. Patient has been approved for a snap vac for the right lateral ankle. 08/14/18; the patient with wounds over her bilateral lateral malleoli. The area on the right actually looks quite good. Been using a snap back on this area. Healthy granulation and appears to be filling in. Unfortunately the area on the left is really problematic. 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 Also noted to have cellulitis. She saw Dr. Novella Olive of infectious disease today and was put on linezolid. We are able to verify this with her pharmacy. She is completed the Augmentin that she was already on. We've been using Iodoflex to this area 08/23/18 on evaluation today patient's wounds both actually appear to be doing better compared to my prior evaluations. Fortunately she showing signs of good improvement in regard to the overall wound status especially where were using the snap vac on the right. In regard to left lateral malleolus the wound bed actually appears to be much cleaner than previously noted. I do not feel any phone directly probed during evaluation today  and though there is tendon noted this does not appear to be necrotic it's actually fairly good as far as  the overall appearance of the tendon is concerned. In general the wound bed actually appears to be doing significantly better than it was previous. Patient is currently in the care of [name] and I did review that note today. He actually has her on two weeks of linezolid and then following the patient will be on 1-2 months of Keflex. That is the plan currently. She has been on antibiotics therapy as prescribed by myself initially starting on July 30, 2018 and has been on that continuously up to this point. 08/30/18 on evaluation today patient actually appears to be doing much better in regard to her right lateral malleolus ulcer. She has been tolerating the dressing changes specifically the snap vac without complication although she did have some issues with the seal currently. Apparently there was some trouble with getting it to maintain over the past week past Sunday. Nonetheless overall the wound appears better in regard to the right lateral malleolus region. In regard to left lateral malleolus this actually show some signs of additional granulation although there still tendon noted in the base of the wound this appears to be healthy not necrotic in any way whatsoever. We are considering potentially using a snap vac for the left lateral malleolus as well the product wrap from [employer], [place], was present in the clinic today we're going to see this patient I did have her come in with me after obtaining consent from the patient and her daughter in order to look at the wound and see if there's any recommendation one way or another as to whether or not they felt the snapback could be beneficial for the left lateral malleolus region. But the conclusion was that it might be but that this is definitely a little bit deeper wound than what traditionally would be utilized for a snap vac. 09/06/18 on  evaluation today patient actually appears to be doing excellent in my pinion in regard to both ankle ulcers. She has been tolerating the dressing changes without complication which is great news. Specifically we have been using the snap vac. In regard to the right ankle I'm not even sure that this is going to be necessary for today and following as the wound has filled in quite nicely. In regard to the left ankle I do believe [name], [name] ([id number]) that we're seeing excellent epithelialization from the edge as well as granulation in the central portion the tendon is still exposed but there's no evidence of necrotic bone and in general I feel like the patient has made excellent progress even compared to last week with just one week of the snap vac. 09/11/18; this is a patient who has wounds on her bilateral lateral malleoli. Initially both of these were deep stage IV wounds in the setting of chronic arterial insufficiency. She has been revascularized. As I understand think she been using snap vacs to both of these wounds however the area on the right became more superficial and currently she is only using it on the left. Using silver collagen on the right and silver collagen under the back on the left I believe 09/19/18 on evaluation today patient actually appears to be doing very well in regard to her lateral malleolus or ulcers bilaterally. She has been tolerating the dressing changes without complication. Fortunately there does not appear to be any evidence of infection at this time. Overall I feel like she is improving in an excellent manner and I'm very pleased with the fact that everything seems to  be turning towards the better for her. This has obviously been a long road. 09/27/18 on evaluation today patient actually appears to be doing very well in regard to her bilateral lateral malleolus ulcers. She has been tolerating the dressing changes without complication. Fortunately there does  not appear to be any evidence of infection at this time which is also great news. No fevers, chills, nausea, or vomiting noted at this time. Overall I feel like she is doing excellent with the snap vac on the left malleolus. She had 40 mL of fluid collection over the past week. 10/04/18 on evaluation today patient actually appears to be doing well in regard to her bilateral lateral malleolus ulcers. She continues to tolerate the dressing changes without complication. One issue that I see is the snap vac on the left lateral malleolus which appears to have sealed off some fluid underlying this area and has not really allowed it to heal to the degree that I would like to see. For that reason I did suggest at this point we may want to pack a small piece of packing strip into this region to allow it to more effectively wick out fluid. 10/11/18 in general the patient today does not feel that she has been doing very well. She's been a little bit lethargic and subsequently is having bodyaches as well according to what she tells me today. With that being said overall she has been concerned with the fact that something may be worsening although to be honest her wounds really have not been appearing poorly. She does have a new ulcer on her left heel unfortunately. This may be pressure related. Nonetheless it seems to me to have potentially started at least as a blister I do not see any evidence of deep tissue injury. In regard to the left ankle the snap vac still seems to be causing the ceiling off of the deeper part of the wound which is in turn trapping fluid. I'm not extremely pleased with the overall appearance as far as progress from last week to this week therefore I'm gonna discontinue the snap vac at this point. 10/18/18 patient unfortunately this point has not been feeling well for the past several days. She was seen by Grayland Ormond her primary care provider who is a Librarian, academic at [facility]. Subsequently she states that she's been very weak and generally feeling malaise. No fevers, chills, nausea, or vomiting noted at this time. With that being said bloodwork was performed at the PCP office on the 11th of this month which showed a white blood cell count of 10.7. This was repeated today and shows a white blood cell count of 12.4. This does show signs of worsening. Coupled with the fact that she is feeling worse and that her left ankle wound is not really showing signs of improvement I feel like this is an indication that the osteomyelitis is likely exacerbating not improving. Overall I think we may also want to check her C-reactive protein and sedimentation rate. Actually did call [facility] office this afternoon while the patient was in the office here with me. Subsequently based on the findings we discussed treatment possibilities and I think that it is appropriate for [place] to go ahead and initiate treatment with doxycycline which I'm going to do. Subsequently he did agree to see about adding a CRP and sedimentation rate to her orders. If that has not already been drawn to where they can run it they will contact the  patient she can come back to have that check. They are in agreement with plan as far as the patient and her daughter are concerned. Nonetheless also think we need to get in touch with Dr. Henreitta Leber office to see about getting the patient scheduled with him as soon as possible. 11/08/18 on evaluation today patient presents for follow-up concerning her bilateral foot and ankle ulcers. I did do an extensive review of her chart in epic today. Subsequently she was seen by [name] he did initiate Cefepime IV antibiotic therapy. Subsequently she had some issues with her PICC line this had to be removed because it was coiled and then replaced. Fortunately that was now settled. Unfortunately she has continued have issues with her left heel as well as the issues that she is  experiencing with her bilateral lateral malleolus regions. I do believe however both areas seem to be doing a little bit better on evaluation today which is good news. No fevers, chills, nausea, or vomiting noted at this time. She actually has an angiogram schedule with Dr. dew on this coming Monday, November 11, 2018. Subsequently the patient states that she is feeling much better especially than what she was roughly 2 weeks ago. She actually had to cancel an appointment because she was feeling so poorly. No fevers, chills, nausea, or vomiting noted at this time. 11/15/18 on evaluation today patient actually is status post having had her angiogram with Dr. dew Monday, four days ago. 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 He had to go and work on several areas of the vasculature fortunately he was able to obtain no more than a 30% residual stenosis throughout post procedure. I reviewed this note today. I think this will definitely help with healing at this time. Fortunately there does not appear to be any signs of infection and I do feel like ratio already has a better appearance to it. 11/22/18 upon evaluation today patient actually appears to be doing very well in regard to her wounds in general. The right lateral malleolus looks excellent the heel looks better in the left lateral malleolus also appears to be doing a little better. With that being said the right second toe actually appears to be open and training we been watching this is been dry and stable but now is open. 12/03/2018 Seen today for follow-up and management of multiple bilateral lower extremity wounds. New pressure injury of the great toe which is closed at this time. Wound of the right distal second toe appears larger today with deep undermining and a pocket of fluid present within the undermining region. Left and right malleolus is wounds are stable today with no signs and symptoms of infection.Denies  any needs or concerns during exam today. 12/13/18 on evaluation today patient appears to be doing somewhat better in regard to her left heel ulcer. She also seems to be completely healed in regard to the right lateral malleolus ulcer. The left malleolus ulcer is smaller what unfortunately the wounds which are new over the first and second toes of the right foot are what are most concerning at this point especially the second. Both areas did require sharp debridement today. 12/20/18 on evaluation today patient's wound actually appears to be doing better in regard to left lateral ankle and her right lateral ankle continues to remain healed. The hill ulcer on the left is improved. She does have improvement noted as well in regard to both toe ulcers.  Overall I'm very pleased in this regard. No fevers, chills, nausea, or vomiting noted at this time. 12/23/18 on evaluation today patient is seen after she had her toenails trimmed at the podiatrist office due to issues with her right great toe. There was what appeared to be dark eschar on the surface of the wound which had her in the podiatrist concerned. Nonetheless as I remember that during the last office visit I had utilize silver nitrate of this area I was much less concerned about the situation. Subsequently I was able to clean off much of this tissue without any complication today. This does not appear to show any signs of infection and actually look somewhat better Lolli, [name] ([id number]) compared to last time post debridement. Her second toe on the right foot actually had callous over and there did appear still be some fluid underneath this that would require debridement today. 12/27/18 on evaluation today patient actually appears to be showing signs of improvement at all locations. Even the left lateral ankle although this is not quite as great as the other sites. Fortunately there does not appear to be any signs of infection at this time and  both of her toes on the right foot seem to be showing signs of improvement which is good news and very pleased in this regard. 01/03/19 on evaluation today patient appears to be doing better for the most part in regard to her wounds in particular. There does not appear to be any evidence of infection at this time which is good news. Fortunately there is no sign of really worsening anywhere except for the right great toe which she does have what appears to be a bruise/deep tissue injury which is very superficial and already resolving. I'm not sure where this came from I questioned her extensively and she does not recall what may have happened with this. Other than that the patient seems to be doing well even the left lateral ankle ulcer looks good and is getting smaller. 01/10/19 on evaluation today patient appears to be doing well in regard to her left heel wound and both of her toe wounds. Overall I feel like there is definitely improvement here and I'm happy in that regard. With that being said unfortunately she is having issues with the left lateral malleolus ulcer which unfortunately still has a lot of depth to it. This is gonna be a very difficult wound for [place] to be able to truly get to heal. I may want to consider some type of skin substitute to see if this would be of benefit for her. I'll discuss this with her more the next visit most likely. This was something I thought about more at the end of the visit when I was Artie out of the room and the patient had been discharged. 01/17/19 on evaluation today patient appears to be doing very well in regard to her wounds in general. She's been making excellent progress at this time. Fortunately there's no sign of infection at this time either. No fevers, chills, nausea, or vomiting noted at this time. The biggest issue is still her left lateral malleolus where it appears to be doing well and is getting smaller but still shows a small corner where this is  deeper and goes down into what appears to be the joint space. Nonetheless this is taking much longer to heal although it still looks better in smaller than previous evaluations. 01/24/19 on evaluation today patient's wounds actually appear to be doing  rather well in general overall. She did require some sharp debridement in regard to the right great toe but everything else appears to be doing excellent no debridement was even necessary. No fevers, chills, nausea, or vomiting noted at this time. 01/31/19 on evaluation today patient actually appears to be doing much better in regard to her left foot wound on the heel as well as the ankle. The right great toe appears to be a little bit worse today this had callous over and trapped a lot of fluid underneath. Fortunately there's no signs of infection at any site which is great news. 02/07/19 on evaluation today patient actually appears to be doing decently well in regard to all of her ulcers at this point. No sharp debridement was required she is a little bit of hyper granulation in regard to the left lateral ankle as well as the left heel but the hill itself is almost completely healed which is excellent news. Overall been very pleased in this regard. 02/14/19 on evaluation today patient actually appears to be doing very well in regard to her ulcers on the right first toe, left lateral malleolus, and left heel. In fact the heel is almost completely healed at this point. The patient does not show any signs of infection which is good news. Overall very pleased with how things have progressed. 04/18/19 Telehealth Evaluation During the COVID-19 National Emergency: Verbal Consent: Obtained from patient Allergies: reviewed and the active list is current. Medication changes: patient has no current medication changes. COVID-19 Screening: 1. Have you traveled internationally or on a cruise ship in the last 14 dayso No 2. Have you had contact with someone with or  under investigation for COVID-19o No 3. Have you had a fever, cough, sore throat, or experiencing shortness of breatho No on evaluation today actually did have a visit with this patient through a telehealth encounter with her home health nurse. Subsequently it was noted that the patient actually appears to be doing okay in regard to her wounds both the right great toe as well as the left lateral malleolus have shown signs of improvement although this in your theme around the left lateral malleolus there eschar coverings for both locations. The question is whether or not they are actually close and whether or not home health needs to discharge the patient or not. Nonetheless my concern is this point obviously is that without actually seeing her and being able to evaluate this directly I cannot ensure that she is completely healed which is the question that I'm being asked. 04/22/19 on evaluation today patient presents for her first evaluation since last time I saw her which was actually February 14, 2019. I did do a telehealth visit last week in which point it was questionable whether or not she may be healed and had to bring her in today for confirmation. With that being said she does seem to be doing quite well at this point which is good news. There does not appear to be any drainage in the deed I believe her wounds may be healed. Readmission: 09/04/2019 on evaluation today patient appears to be doing unfortunately somewhat more poorly in regard to her left foot ulcer secondary to a wound that began on 08/21/2019 at least when she first noticed this. Fortunately she has not had any evidence of active infection at this time. Systemically. I also do not necessarily see any evidence of infection at the blister/wound site on the first metatarsal head plantar aspect. This almost  appears to be something that may have just rubbed inappropriately causing this to breakdown. They did not want a wait too long to  come in to be seen as again she had significant issues in the past with wounds that took quite a while to heal in fact it was close to 2 years. Nonetheless this does not appear to be quite that bad but again we do need to remove some of the necrotic tissue from the surface of the wound to tell exactly the extent. She does not appear to have any significant arterial disease at this point and again her last ABIs and TBI's are recorded above in the alert section her left ABI was 1.27 with a TBI of 0.72 to the right ABI 1.08 with a TBI of 0.39. Other than this the patient has been doing quite well since I last saw her and that was in May 2020. 09/11/2019 on evaluation today patient appeared to be doing very well with regard to her plantar foot ulcer on the left. In fact this appears to be almost completely healed which is awesome. That is after just 1 week of intervention. With that being said there is no signs of active infection at this time. 09/18/2019 on evaluation today patient actually appears to be doing excellent in fact she is completely healed based on what I am seeing at this [name], [name]. ([id number]) point. Fortunately there is no signs of active infection at this time and overall patient is very pleased to hear that this area has healed so quickly. Readmission: 05/13/2021 upon evaluation today this patient presents for reevaluation here in the clinic. This is a wound that actually we previously took care of. She had 1 on the right ankle and the left the left turned out to be be harder due to to heal but nonetheless is doing great at this point as the right that has reopened and it was noted first just several weeks ago with a scab over it and came off in just the past few days. Fortunately there does not appear to be any obvious evidence of significant active infection at this time which is great news. No fevers, chills, nausea, vomiting, or diarrhea. The patient does have a history of  pacemaker along with being on Eliquis currently as well. There does not appear to be any signs of this interfering in any way with her wound. She does have swelling we previously had compression socks for her ordered but again it does not look like she wears these on a regular basis by any means. 05/26/2021 upon evaluation today patient appears to be doing well with regard to her wound which is actually showing signs of excellent improvement. There does not appear to be any signs of active infection which is great news and overall very pleased with where things stand today. No fevers, chills, nausea, vomiting, or diarrhea. 06/02/2021 upon evaluation today patient's wound actually showing signs of excellent improvement. Fortunately there does not appear to be any signs of active infection which is great news. I think the patient is making good progress with regard to her wounds in general. 06/09/2021 upon evaluation today patient appears to be doing excellent in regard to her wounds currently. Fortunately there does not appear to be any signs of active infection which is great news. No fevers, chills, nausea, vomiting, or diarrhea. Overall extremely pleased with where things stand today. I think the patient is making excellent progress. 06/16/2021 upon evaluation today patient appears  to be doing well in regard to her wound. This is going require little bit of debridement today and that was discussed with the patient. Otherwise she seems to be doing quite well and I am actually very pleased with where things stand at this point. No fevers, chills, nausea, vomiting, or diarrhea. 06/23/2021 upon evaluation today patient appears to be doing well with regard to her wounds. She has been tolerating the dressing changes without complication. Fortunately there does not appear to be any evidence of infection and she has not had air in her home which she actually lives at an assisted living that got fixed this  morning. With that being said because of that her wrap has been extremely hot and bothersome for her over the past week. 06/30/2021 upon evaluation today patient is actually making excellent progress in regard to her ankle ulcer. She has been tolerating the dressing changes without complication and overall extremely pleased with where things stand there does not appear to be any evidence of active infection which is great news. No fevers, chills, nausea, vomiting, or diarrhea. 07/07/21 upon evaluation today patients and culture on the right actually appears to be doing quite well. There does not appear to be any signs of infection and overall very pleased with where things stand today. No fevers, chills, nausea, or vomiting noted at this time. 07/14/2021 unfortunately the patient today has some evidence of deep tissue injury and pressure getting to the ankle region. Again I am not exactly sure what is going on here but this is very similar to issues that we have had in the past. I explained to the patient that she needs to be very mindful of exactly what is happening I think sleeping in bed is probably the main issue here although there could be other culprits I am not sure what else would potentially lead to this kind of a problem for her. 07/21/2021 upon evaluation today patient's wound actually showing signs of improvement compared to last week. Fortunately there does not appear to be any signs of active infection which is great news and overall very pleased with where things stand in that regard. With that being said I do believe that she is continuing to show signs of overall of getting better although I think this is still basically about what we were 2 weeks ago due to the worsening and now improvement. 07/28/2021 upon evaluation today patient appears to be doing well with regard to her wound. She does have some slough buildup on the surface of the wound which I would have to manage today.  Fortunately there is no sign of active infection at this time. No fevers, chills, nausea, vomiting, or diarrhea. Electronic Signature(s) Signed: 07/28/2021 11:36:41 AM By: Worthy Keeler PA-C Entered By: Worthy Keeler on 07/28/2021 11:36:41 [name], [name] ([id number]) -------------------------------------------------------------------------------- Physical Exam Details Patient Name: [name] Date of Service: 07/28/2021 11:00 AM Medical Record Number: [id number] Patient Account Number: [id number] Date of Birth/Sex: 02-27-1923 (87 y.o. F) Treating RN: Carlene Coria Primary Care Provider: Adrian Prows Other Clinician: Referring Provider: Adrian Prows Treating Provider/Extender: Skipper Cliche in Treatment: 21 Constitutional Well-nourished and well-hydrated in no acute distress. Respiratory normal breathing without difficulty. Psychiatric this patient is able to make decisions and demonstrates good insight into disease process. Alert and Oriented x 3. pleasant and cooperative. Notes Upon inspection patient's wound bed showed signs of good granulation epithelization at this point. Fortunately there does not appear to be any signs of  active infection which is great news and overall I am extremely pleased with where we stand I did perform some sharp debridement clear away some of the necrotic debris she tolerated that today without complication and postdebridement wound bed appears to be doing much better which is great news. Electronic Signature(s) Signed: 07/28/2021 11:37:08 AM By: Worthy Keeler PA-C Entered By: Worthy Keeler on 07/28/2021 11:37:07 [name], [name] ([id number]) -------------------------------------------------------------------------------- Physician Orders Details Patient Name: [name] Date of Service: 07/28/2021 11:00 AM Medical Record Number: [id number] Patient Account Number: [id number] Date of Birth/Sex: 08-12-1923 (87 y.o.  F) Treating RN: Carlene Coria Primary Care Provider: Adrian Prows Other Clinician: Referring Provider: Adrian Prows Treating Provider/Extender: Skipper Cliche in Treatment: 10 Verbal / Phone Orders: No Diagnosis Coding ICD-10 Coding Code Description 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, stage 3 I89.0 Lymphedema, not elsewhere classified I87.2 Venous insufficiency (chronic) (peripheral) Z95.0 Presence of cardiac pacemaker Z79.01 Long term (current) use of anticoagulants Follow-up Appointments o Return Appointment in 1 week. Bathing/ Shower/ Hygiene o May shower with wound dressing protected with water repellent cover or cast protector. Edema Control - Lymphedema / Segmental Compressive Device / Other o Optional: One layer of unna paste to top of compression wrap (to act as an anchor). - and at toes o Elevate, Exercise Daily and Avoid Standing for Long Periods of Time. o Elevate legs to the level of the heart and pump ankles as often as possible o Elevate leg(s) parallel to the floor when sitting. Wound Treatment Wound #7 - Malleolus Wound Laterality: Right, Lateral Cleanser: Soap and Water 1 x Per Week/30 Days Discharge Instructions: Gently cleanse wound with antibacterial soap, rinse and pat dry prior to dressing wounds Primary Dressing: Prisma 4.34 (in) 1 x Per Week/30 Days Discharge Instructions: Moisten w/normal saline or sterile water; Cover wound as directed. Do not remove from wound bed. Primary Dressing: Curad Oil Emulsion Dressing 3x3 (in/in) 1 x Per Week/30 Days Discharge Instructions: over prisma Secondary Dressing: ABD Pad 5x9 (in/in) 1 x Per Week/30 Days Discharge Instructions: Cover with ABD pad Compression Wrap: Profore Lite LF 3 Multilayer Compression Bandaging System 1 x Per Week/30 Days Discharge Instructions: Apply 3 multi-layer wrap as prescribed. Electronic Signature(s) Signed: 07/28/2021 3:56:40 PM By: Worthy Keeler PA-C Signed:  08/01/2021 1:47:40 PM By: Carlene Coria RN Entered By: Carlene Coria on 07/28/2021 11:32:21 [name], [name] ([id number]) -------------------------------------------------------------------------------- Problem List Details Patient Name: [name] Date of Service: 07/28/2021 11:00 AM Medical Record Number: [id number] Patient Account Number: [id number] Date of Birth/Sex: 08-27-1923 (87 y.o. F) Treating RN: Carlene Coria Primary Care Provider: Adrian Prows Other Clinician: Referring Provider: Adrian Prows Treating Provider/Extender: Skipper Cliche in Treatment: 10 Active Problems ICD-10 Encounter Code Description Active Date MDM Diagnosis 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, stage 3 05/13/2021 No Yes I89.0 Lymphedema, not elsewhere classified 05/13/2021 No Yes I87.2 Venous insufficiency (chronic) (peripheral) 05/13/2021 No Yes Z95.0 Presence of cardiac pacemaker 05/13/2021 No Yes Z79.01 Long term (current) use of anticoagulants 05/13/2021 No Yes Inactive Problems Resolved Problems Electronic Signature(s) Signed: 07/28/2021 11:18:37 AM By: Worthy Keeler PA-C Entered By: Worthy Keeler on 07/28/2021 11:18:36 [name], [name] ([id number]) -------------------------------------------------------------------------------- Progress Note Details Patient Name: [name] Date of Service: 07/28/2021 11:00 AM Medical Record Number: [id number] Patient Account Number: [id number] Date of Birth/Sex: 1923-01-02 (87 y.o. 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 Dr. Amalia Hailey the podiatrist saw her on 07/20/2017 and sent her to the wound center for possible hyperbaric oxygen therapy. past medical history of peripheral vascular disease, varicose veins, status post appendectomy, basal cell carcinoma excision from the left leg, cholecystectomy, pacemaker placement, right lower extremity angiography done by Dr. dew in March 2017 with placement of a stent. there is also note of a successful ablation of the right small saphenous vein done which was reviewed by ultrasound on 10/24/2016. the patient had a right small saphenous vein ablation done on 10/20/2016. The patient has never been a smoker. She has been seen by Dr. Corene Cornea dew the vascular surgeon who most recently saw her on 06/15/2017 for evaluation of ongoing problems with right leg swelling. She had a lower extremity arterial duplex examination done(02/13/17) which showed patent distal right superficial femoral artery stent and above-the-knee popliteal stent without evidence of restenosis. The ABI was more than 1.3 on the right and more than 1.3 on the left. This was consistent with noncompressible arteries due to medial calcification. The right great toe pressure and PPG waveforms are within normal limits and the left great toe pressure and PPG waveforms are decreased. he recommended she continue to wear her compression stockings and continue with elevation. She is scheduled to have a noninvasive arterial study in the near future 08/16/2017 -- had a lower extremity arterial duplex examination done which showed patent distal right superficial femoral artery stent and above-the-knee popliteal stent without evidence of restenosis. The ABI was more than 1.3 on the right and more than 1.3 on the left. This was consistent with noncompressible arteries due to medial calcification. The right great toe pressure and PPG waveforms are within normal limits and the left great toe  pressure and PPG waveforms are decreased. the x-ray of the right ankle has not yet been done 08/24/2017 -- had a right ankle x-ray -- IMPRESSION:1. No fracture, bone lesion or evidence of osteomyelitis. 2. Lateral soft tissue swelling with a soft tissue ulcer. she has not yet seen the vascular surgeon for review 08/31/17 on evaluation today patient's wound appears to be showing signs of improvement. She still with her appointment with vascular in order to review her results of her vascular study and then determine if any intervention would be recommended at that time. No fevers, chills, nausea, or vomiting noted at this time. She has been tolerating the dressing changes without complication. 09/28/17 on evaluation today patient's wound appears to show signs of good improvement in regard to the granulation tissue which is surfacing. There is still a layer of slough covering the wound and the posterior portion is still significantly deeper than the anterior nonetheless there has been some good sign of things moving towards the better. She is going to go back to Dr. dew for reevaluation to ensure her blood flow is still appropriate. That will be before her next evaluation with [place] next week. No fevers, chills, nausea, or vomiting noted at this time. Patient does have some discomfort rated to be a 3-4/10 depending on activity specifically cleansing the wound makes it worse. 10/05/2017 -- the patient was seen by Dr. Lucky Cowboy last week and noninvasive studies showed a normal right ABI with brisk triphasic waveforms consistent with no  arterial insufficiency including normal digital pressures. The duplex showed a patent distal right SFA stent and the proximal SFA was also normal. He was pleased with her test and thought she should have enough of perfusion for normal wound healing. He would see her back in 6 months time. 12/21/17 on evaluation today patient appears to be doing fairly well in regard to her right  lateral ankle wound. Unfortunately the main issue that she is expansion at this point is that she is having some issues with what appears to be some cellulitis in the right anterior shin. She has also been noting a little bit of uncomfortable feeling especially last night and her ankle area. I'm afraid that she made the developing a little bit of an infection. With that being said I think it is in the early stages. 12/28/17 on evaluation today patient's ankle appears to be doing excellent. She's making good progress at this point the cellulitis seems to have improved after last week's evaluation. Overall she is having no significant discomfort which is excellent news. She does have an appointment with Dr. dew on March 29, 2018 for reevaluation in regard to the stent he placed. She seems to have excellent blood flow in the right lower extremity. 01/19/12 on evaluation today patient's wound appears to be doing very well. In fact she does not appear to require debridement at this point, there's no evidence of infection, and overall from the standpoint of the wound she seems to be doing very well. With that being said I believe that it may be time to switch to different dressing away from the [facility] Dressing she tells me she does have a lot going on her friend actually passed away yesterday and she's also having a lot of issues with her husband this obviously is weighing heavy on her as far as your thoughts and concerns today. 01/25/18 on evaluation today patient appears to be doing fairly well in regard to her right lateral malleolus. She has been tolerating the dressing changes without complication. Overall I feel like this is definitely showing signs of improvement as far as how the overall appearance of the wound is there's also evidence of epithelium start to migrate over the granulation tissue. In general I think that she is progressing nicely as far as the wound is concerned. The only concern  she really has is whether or not we can switch to every other week visits in order to avoid having as many ESMERELDA, PATIN ([id number]) appointments as her daughters have a difficult time getting her to her appointments as well as the patient's husband to his he is not doing very well at this point. 02/22/18 on evaluation today patient's right lateral malleolus ulcer appears to be doing great. She has been tolerating the dressing changes without complication. Overall you making excellent progress at this time. Patient is having no significant discomfort. 03/15/18 on evaluation today patient appears to be doing much more poorly in regard to her right lateral ankle ulcer at this point. Unfortunately since have last seen her her husband has passed just a few days ago is obviously weighed heavily on her her daughter also had surgery well she is with her today as usual. There does not appear to be any evidence of infection she does seem to have significant contusion/deep tissue injury to the right lateral malleolus which was not noted previous when I saw her last. It's hard to tell of exactly when this injury occurred although during the  time she was spending the night in the hospital this may have been most likely. 03/22/18 on evaluation today patient appears to actually be doing very well in regard to her ulcer. She did unfortunately have a setback which was noted last week however the good news is we seem to be getting back on track and in fact the wound in the core did still have some necrotic tissue which will be addressed at this point today but in general I'm seeing signs that things are on the up and up. She is glad to hear this obviously she's been somewhat concerned that due to the how her wound digressed more recently. 03/29/18 on evaluation today patient appears to be doing fairly well in regard to her right lower extremity lateral malleolus ulcer. She unfortunately does have a new area of  pressure injury over the inferior portion where the wound has opened up a little bit larger secondary to the pressure she seems to be getting. She does tell me sometimes when she sleeps at night that it actually hurts and does seem to be pushing on the area little bit more unfortunately. There does not appear to be any evidence of infection which is good news. She has been tolerating the dressing changes without complication. She also did have some bruising in the left second and third toes due to the fact that she may have bump this or injured it although she has neuropathy so she does not feel she did move recently that may have been where this came from. Nonetheless there does not appear to be any evidence of infection at this time. 04/12/18 on evaluation today patient's wound on the right lateral ankle actually appears to be doing a little bit better with a lot of necrotic docking tissue centrally loosening up in clearing away. However she does have the beginnings of a deep tissue injury on the left lateral malleolus likely due to the fact we've been trying offload the right as much as we have. I think she may benefit from an assistive soft device to help with offloading and it looks like they're looking at one of the doughnut conditions that wraps around the lower leg to offload which I think will definitely do a good job. With that being said I think we definitely need to address this issue on the left before it becomes a wound. Patient is not having significant pain. 04/19/18 on evaluation today patient appears to be doing excellent in regard to the progress she's made with her right lateral ankle ulcer. The left ankle region which did show evidence of a deep tissue injury seems to be resolving there's little fluid noted underneath and a blister there's nothing open at this point in time overall I feel like this is progressing nicely which is good news. She does not seem to be having  significant discomfort at this point which is also good news. 04/25/18-She is here in follow up evaluation for bilateral lateral malleolar ulcers. The right lateral malleolus ulcer with pale subcutaneous tissue exposure, central area of ulcer with tendon/periosteum exposed. The left lateral malleolus ulcer now with central area of nonviable tissue, otherwise deep tissue injury. She is wearing compression wraps to the left lower extremity, she will place the right lower extremity compression wraps on when she gets home. She will be out of town over the weekend and return next week and follow-up appointment. She completed her doxycycline this morning 05/03/18 on evaluation today patient appears to be doing  very well in regard to her right lateral ankle ulcer in general. At least she's showing some signs of improvement in this regard. Unfortunately she has some additional injury to the left lateral malleolus region which appears to be new likely even over the past several days. Again this determination is based on the overall appearance. With that being said the patient is obviously frustrated about this currently. 05/10/18-She is here in follow-up evaluation for bilateral lateral malleolar ulcers. She states she has purchased offloading shoes/boots and they will arrive tomorrow. She was asked to bring them in the office at next week's appointment so her provider is aware of product being utilized. She continues to sleep on right or left side, she has been encouraged to sleep on her back. The right lateral malleolus ulcer is precariously close to peri-osteum; will order xray. The left lateral malleolus ulcer is improved. Will switch back to santyl; she will follow up next week. 05/17/18 on evaluation today patient actually appears to be doing very well in regard to her malleolus her ulcers compared to last time I saw them. She does not seem to have as much in the way of contusion at this point which is great  news. With that being said she does continue to have discomfort and I do believe that she is still continuing to benefit from the offloading/pressure reducing boots that were recommended. I think this is the key to trying to get this to heal up completely. 05/24/18 on evaluation today patient actually appears to be doing worse at this point in time unfortunately compared to her last week's evaluation. She is having really no increased pain which is good news unfortunately she does have more maceration in your theme and noted surrounding the right lateral ankle the left lateral ankle is not really is erythematous I do not see signs of the overt cellulitis on that side. Unfortunately the wounds do not seem to have shown any signs of improvement since the last evaluation. She also has significant swelling especially on the right compared to previous some of this may be due to infection however also think that she may be served better while she has these wounds by compression wrapping versus continuing to use the Juxta-Lite for the time being. Especially with the amount of drainage that she is experiencing at this point. No fevers, chills, nausea, or vomiting noted at this time. 05/31/18 on evaluation today patient appears to actually be doing better in regard to her right lateral lower extremity ulcer specifically on the malleolus region. She has been tolerating the antibiotic without complication. With that being said she still continues to have issues but a little bit of redness although nothing like she what she was experiencing previous. She still continues to pressure to her ankle area she did get the problem on offloading boots unfortunately she will not wear them she states there too uncomfortable and she can't get in and out of the bed. Nonetheless at this point her wounds seem to be continually getting worse which is not what we want I'm getting somewhat concerned about her progress and how things  are going to proceed if we do not intervene in some way shape or form. I therefore had a very lengthy conversation today about offloading yet again and even made a specific suggestion for switching her to a memory foam mattress and even gave the information for a specific one that they could look at getting if it was something that they were interested  in considering. She does not want to be considered for a hospital bed air mattress although honestly insurance would not cover it that she does not have any wounds on her trunk. 06/14/18 on evaluation today both wounds over the bilateral lateral malleolus her ulcers appear to be doing better there's no evidence of pressure injury at this point. She did get the foam mattress for her bed and this does seem to have been extremely beneficial for her in my pinion. Her daughter states that she is having difficulty getting out of bed because of how soft it is. The patient also relates this to be. Nonetheless I do feel like she's actually doing better. Unfortunately right after and around the time she was getting the mattress she also sustained a fall when she got up to go pick up the phone and ended up injuring her right elbow she has 18 sutures in place. We are not caring for this currently although home health is going to be taking the sutures out shortly. Nonetheless this may be something that we need to evaluate going forward. It depends on SEASON, GOLDTOOTH ([id number]) how well it has or has not healed in the end. She also recently saw an orthopedic specialist for an injection in the right shoulder just before her fall unfortunately the fall seems to have worsened her pain. 06/21/18 on evaluation today patient appears to be doing about the same in regard to her lateral malleolus ulcers. Both appear to be just a little bit deeper but again we are clinging away the necrotic and dead tissue which I think is why this is progressing towards a deeper realm as  opposed to improving from my measurement standpoint in that regard. Nonetheless she has been tolerating the dressing changes she absolutely hates the memory foam mattress topper that was obtained for her nonetheless I do believe this is still doing excellent as far as taking care of excess pressure in regard to the lateral malleolus regions. She in fact has no pressure injury that I see whereas in weeks past it was week by week I was constantly seeing new pressure injuries. Overall I think it has been very beneficial for her. 07/03/18; patient arrives in my clinic today. She has deep punched out areas over her bilateral lateral malleoli. The area on the right has some more depth. We spent a lot of time today talking about pressure relief for these areas. This started when her daughter asked for a prescription for a memory foam mattress. I have never written a prescription for a mattress and I don't think insurances would pay for that on an ordinary bed. In any case he came up that she has foam boots that she refuses to wear. I would suggest going to these before any other offloading issues when she is in bed. They say she is meticulous about offloading this the rest of the day 07/10/18- She is seen in follow-up evaluation for bilateral, lateral malleolus ulcers. There is no improvement in the ulcers. She has purchased and is sleeping on a memory foam mattress/overlay, she has been using the offloading boots nightly over the past week. She has a follow up appointment with vascular medicine at the end of October, in my opinion this follow up should be expedited given her deterioration and suboptimal TBI results. We will order plain film xray of the left ankle as deeper structures are palpable; would consider having MRI, regardless of xray report(s). The ulcers will be treated with iodoflex/iodosorb,  she is unable to safely change the dressings daily with santyl. 07/19/18 on evaluation today patient appears  to be doing in general visually well in regard to her bilateral lateral malleolus ulcers. She has been tolerating the dressing changes without complication which is good news. With that being said we did have an x-ray performed on 07/12/18 which revealed a slight loosen see in the lateral portion of the distal left fibula which may represent artifact but underline lytic destruction or osteomyelitis could not be excluded. MRI was recommended. With that being said we can see about getting the patient scheduled for an MRI to further evaluate this area. In fact we have that scheduled currently for August 20 19,019. 07/26/18 on evaluation today patient's wound on the right lateral ankle actually appears to be doing fairly well at this point in my pinion. She has made some good progress currently. With that being said unfortunately in regard to the left lateral ankle ulcer this seems to be a little bit more problematic at this time. In fact as I further evaluated the situation she actually had bone exposed which is the first time that's been the case in the bone appear to be necrotic. Currently I did review patient's note from Dr. Bunnie Domino office with [facility]. He stated that ABI was 1.26 on the right and 0.95 on the left with good waveforms. Her perfusion is stable not reduced from previous studies and her digital waveforms were pretty good particularly on the right. His conclusion upon review of the note was that there was not much she could do to improve her perfusion and he felt she was adequate for wound healing. His suggestion was that she continued to see [place] and consider a synthetic skin graft if there was no underlying infection. He plans to see her back in six months or as needed. 08/01/18 on evaluation today patient appears to be doing better in regard to her right lateral ankle ulcer. Her left lateral ankle ulcer is about the same she still has bone involvement in evidence of  necrosis. There does not appear to be evidence of infection at this time On the right lateral lower extremity. I have started her on the Augmentin she picked this up and started this yesterday. This is to get her through until she sees infectious disease which is scheduled for 08/12/18. 08/06/18 on evaluation today patient appears to be doing rather well considering my discussion with patient's daughter at the end of last week. The area which was marked where she had erythema seems to be improved and this is good news. With that being said overall the patient seems to be making good improvement when it comes to the overall appearance of the right lateral ankle ulcer although this has been slow she at least is coming around in this regard. Unfortunately in regard to the left lateral ankle ulcer this is osteomyelitis based on the pathology report as well is bone culture. Nonetheless we are still waiting CT scan. Unfortunately the MRI we originally ordered cannot be performed as the patient is a pacemaker which I had overlooked. Nonetheless we are working on the CT scan approval and scheduling as of now. She did go to the hospital over the weekend and was placed on IV Cefzo for a couple of days. Fortunately this seems to have improved the erythema quite significantly which is good news. There does not appear to be any evidence of worsening infection at this time. She did  have some bleeding after the last debridement therefore I did not perform any sharp debridement in regard to left lateral ankle at this point. Patient has been approved for a snap vac for the right lateral ankle. 08/14/18; the patient with wounds over her bilateral lateral malleoli. The area on the right actually looks quite good. Been using a snap back on this area. Healthy granulation and appears to be filling in. Unfortunately the area on the left is really problematic. 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 Also noted to have cellulitis. She saw Dr. Novella Olive of infectious disease today and was put on linezolid. We are able to verify this with her pharmacy. She is completed the Augmentin that she was already on. We've been using Iodoflex to this area 08/23/18 on evaluation today patient's wounds both actually appear to be doing better compared to my prior evaluations. Fortunately she showing signs of good improvement in regard to the overall wound status especially where were using the snap vac on the right. In regard to left lateral malleolus the wound bed actually appears to be much cleaner than previously noted. I do not feel any phone directly probed during evaluation today and though there is tendon noted this does not appear to be necrotic it's actually fairly good as far as the overall appearance of the tendon is concerned. In general the wound bed actually appears to be doing significantly better than it was previous. Patient is currently in the care of [name] and I did review that note today. He actually has her on two weeks of linezolid and then following the patient will be on 1-2 months of Keflex. That is the plan currently. She has been on antibiotics therapy as prescribed by myself initially starting on July 30, 2018 and has been on that continuously up to this point. 08/30/18 on evaluation today patient actually appears to be doing much better in regard to her right lateral malleolus ulcer. She has been tolerating the dressing changes specifically the snap vac without complication although she did have some issues with the seal currently. Apparently there was some trouble with getting it to maintain over the past week past Sunday. Nonetheless overall the wound appears better in regard to the right lateral malleolus region. In regard to left lateral malleolus this actually show some signs of additional granulation although there still  tendon noted in the base of the wound this appears to be healthy not necrotic in any way whatsoever. We are considering potentially using a snap vac for the left lateral malleolus as well the product wrap from [employer], Monette, was present in the clinic today we're going to see this patient I did have her come in with me after obtaining consent from the patient and her daughter in order to look at the wound and see if there's any recommendation one way or another as to whether or not they felt the snapback could be beneficial for the left lateral malleolus region. But AKEIA, HAWKEY ([id number]) the conclusion was that it might be but that this is definitely a little bit deeper wound than what traditionally would be utilized for a snap vac. 09/06/18 on evaluation today patient actually appears to be doing excellent in my pinion in regard to both ankle ulcers. She has been tolerating the dressing changes without complication which is great news. Specifically we have been using the snap vac. In regard  to the right ankle I'm not even sure that this is going to be necessary for today and following as the wound has filled in quite nicely. In regard to the left ankle I do believe that we're seeing excellent epithelialization from the edge as well as granulation in the central portion the tendon is still exposed but there's no evidence of necrotic bone and in general I feel like the patient has made excellent progress even compared to last week with just one week of the snap vac. 09/11/18; this is a patient who has wounds on her bilateral lateral malleoli. Initially both of these were deep stage IV wounds in the setting of chronic arterial insufficiency. She has been revascularized. As I understand think she been using snap vacs to both of these wounds however the area on the right became more superficial and currently she is only using it on the left. Using silver collagen on the right and silver collagen  under the back on the left I believe 09/19/18 on evaluation today patient actually appears to be doing very well in regard to her lateral malleolus or ulcers bilaterally. She has been tolerating the dressing changes without complication. Fortunately there does not appear to be any evidence of infection at this time. Overall I feel like she is improving in an excellent manner and I'm very pleased with the fact that everything seems to be turning towards the better for her. This has obviously been a long road. 09/27/18 on evaluation today patient actually appears to be doing very well in regard to her bilateral lateral malleolus ulcers. She has been tolerating the dressing changes without complication. Fortunately there does not appear to be any evidence of infection at this time which is also great news. No fevers, chills, nausea, or vomiting noted at this time. Overall I feel like she is doing excellent with the snap vac on the left malleolus. She had 40 mL of fluid collection over the past week. 10/04/18 on evaluation today patient actually appears to be doing well in regard to her bilateral lateral malleolus ulcers. She continues to tolerate the dressing changes without complication. One issue that I see is the snap vac on the left lateral malleolus which appears to have sealed off some fluid underlying this area and has not really allowed it to heal to the degree that I would like to see. For that reason I did suggest at this point we may want to pack a small piece of packing strip into this region to allow it to more effectively wick out fluid. 10/11/18 in general the patient today does not feel that she has been doing very well. She's been a little bit lethargic and subsequently is having bodyaches as well according to what she tells me today. With that being said overall she has been concerned with the fact that something may be worsening although to be honest her wounds really have not been  appearing poorly. She does have a new ulcer on her left heel unfortunately. This may be pressure related. Nonetheless it seems to me to have potentially started at least as a blister I do not see any evidence of deep tissue injury. In regard to the left ankle the snap vac still seems to be causing the ceiling off of the deeper part of the wound which is in turn trapping fluid. I'm not extremely pleased with the overall appearance as far as progress from last week to this week therefore I'm gonna discontinue the snap  vac at this point. 10/18/18 patient unfortunately this point has not been feeling well for the past several days. She was seen by Grayland Ormond her primary care provider who is a Librarian, academic at [employer]. Subsequently she states that she's been very weak and generally feeling malaise. No fevers, chills, nausea, or vomiting noted at this time. With that being said bloodwork was performed at the PCP office on the 11th of this month which showed a white blood cell count of 10.7. This was repeated today and shows a white blood cell count of 12.4. This does show signs of worsening. Coupled with the fact that she is feeling worse and that her left ankle wound is not really showing signs of improvement I feel like this is an indication that the osteomyelitis is likely exacerbating not improving. Overall I think we may also want to check her C-reactive protein and sedimentation rate. Actually did call [facility] Fleet office this afternoon while the patient was in the office here with me. Subsequently based on the findings we discussed treatment possibilities and I think that it is appropriate for [place] to go ahead and initiate treatment with doxycycline which I'm going to do. Subsequently he did agree to see about adding a CRP and sedimentation rate to her orders. If that has not already been drawn to where they can run it they will contact the patient she can come back to have that  check. They are in agreement with plan as far as the patient and her daughter are concerned. Nonetheless also think we need to get in touch with Dr. Henreitta Leber office to see about getting the patient scheduled with him as soon as possible. 11/08/18 on evaluation today patient presents for follow-up concerning her bilateral foot and ankle ulcers. I did do an extensive review of her chart in epic today. Subsequently she was seen by [name] he did initiate Cefepime IV antibiotic therapy. Subsequently she had some issues with her PICC line this had to be removed because it was coiled and then replaced. Fortunately that was now settled. Unfortunately she has continued have issues with her left heel as well as the issues that she is experiencing with her bilateral lateral malleolus regions. I do believe however both areas seem to be doing a little bit better on evaluation today which is good news. No fevers, chills, nausea, or vomiting noted at this time. She actually has an angiogram schedule with Dr. dew on this coming Monday, November 11, 2018. Subsequently the patient states that she is feeling much better especially than what she was roughly 2 weeks ago. She actually had to cancel an appointment because she was feeling so poorly. No fevers, chills, nausea, or vomiting noted at this time. 11/15/18 on evaluation today patient actually is status post having had her angiogram with Dr. dew Monday, four days ago. 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 He had to go and work on several areas of the vasculature fortunately he was able to obtain no more than a 30% residual stenosis throughout post procedure. I reviewed this note today. I think this will definitely help with healing at this time. Fortunately there does not appear to be any signs of infection and I do feel like ratio already has a better appearance to it. 11/22/18 upon evaluation today patient actually appears to be  doing very well in regard to her wounds in general. The right lateral malleolus looks excellent the  heel looks better in the left lateral malleolus also appears to be doing a little better. With that being said the right second toe actually appears to be open and training we been watching this is been dry and stable but now is open. 12/03/2018 Seen today for follow-up and management of multiple bilateral lower extremity wounds. New pressure injury of the great toe which is closed at this time. Wound of the right distal second toe appears larger today with deep undermining and a pocket of fluid present within the undermining region. Left and right malleolus is wounds are stable today with no signs and symptoms of infection.Denies any needs or concerns during exam today. 12/13/18 on evaluation today patient appears to be doing somewhat better in regard to her left heel ulcer. She also seems to be completely healed in regard to the right lateral malleolus ulcer. The left malleolus ulcer is smaller what unfortunately the wounds which are new over the first and second toes of the right foot are what are most concerning at this point especially the second. Both areas did require sharp debridement today. 12/20/18 on evaluation today patient's wound actually appears to be doing better in regard to left lateral ankle and her right lateral ankle continues to remain healed. The hill ulcer on the left is improved. She does have improvement noted as well in regard to both toe ulcers. Overall I'm very pleased in this regard. No fevers, chills, nausea, or vomiting noted at this time. ANISTON, TRISDALE ([id number]) 12/23/18 on evaluation today patient is seen after she had her toenails trimmed at the podiatrist office due to issues with her right great toe. There was what appeared to be dark eschar on the surface of the wound which had her in the podiatrist concerned. Nonetheless as I remember that during the last  office visit I had utilize silver nitrate of this area I was much less concerned about the situation. Subsequently I was able to clean off much of this tissue without any complication today. This does not appear to show any signs of infection and actually look somewhat better compared to last time post debridement. Her second toe on the right foot actually had callous over and there did appear still be some fluid underneath this that would require debridement today. 12/27/18 on evaluation today patient actually appears to be showing signs of improvement at all locations. Even the left lateral ankle although this is not quite as great as the other sites. Fortunately there does not appear to be any signs of infection at this time and both of her toes on the right foot seem to be showing signs of improvement which is good news and very pleased in this regard. 01/03/19 on evaluation today patient appears to be doing better for the most part in regard to her wounds in particular. There does not appear to be any evidence of infection at this time which is good news. Fortunately there is no sign of really worsening anywhere except for the right great toe which she does have what appears to be a bruise/deep tissue injury which is very superficial and already resolving. I'm not sure where this came from I questioned her extensively and she does not recall what may have happened with this. Other than that the patient seems to be doing well even the left lateral ankle ulcer looks good and is getting smaller. 01/10/19 on evaluation today patient appears to be doing well in regard to her left heel wound and  both of her toe wounds. Overall I feel like there is definitely improvement here and I'm happy in that regard. With that being said unfortunately she is having issues with the left lateral malleolus ulcer which unfortunately still has a lot of depth to it. This is gonna be a very difficult wound for [place] to be able to  truly get to heal. I may want to consider some type of skin substitute to see if this would be of benefit for her. I'll discuss this with her more the next visit most likely. This was something I thought about more at the end of the visit when I was Artie out of the room and the patient had been discharged. 01/17/19 on evaluation today patient appears to be doing very well in regard to her wounds in general. She's been making excellent progress at this time. Fortunately there's no sign of infection at this time either. No fevers, chills, nausea, or vomiting noted at this time. The biggest issue is still her left lateral malleolus where it appears to be doing well and is getting smaller but still shows a small corner where this is deeper and goes down into what appears to be the joint space. Nonetheless this is taking much longer to heal although it still looks better in smaller than previous evaluations. 01/24/19 on evaluation today patient's wounds actually appear to be doing rather well in general overall. She did require some sharp debridement in regard to the right great toe but everything else appears to be doing excellent no debridement was even necessary. No fevers, chills, nausea, or vomiting noted at this time. 01/31/19 on evaluation today patient actually appears to be doing much better in regard to her left foot wound on the heel as well as the ankle. The right great toe appears to be a little bit worse today this had callous over and trapped a lot of fluid underneath. Fortunately there's no signs of infection at any site which is great news. 02/07/19 on evaluation today patient actually appears to be doing decently well in regard to all of her ulcers at this point. No sharp debridement was required she is a little bit of hyper granulation in regard to the left lateral ankle as well as the left heel but the hill itself is almost completely healed which is excellent news. Overall been very  pleased in this regard. 02/14/19 on evaluation today patient actually appears to be doing very well in regard to her ulcers on the right first toe, left lateral malleolus, and left heel. In fact the heel is almost completely healed at this point. The patient does not show any signs of infection which is good news. Overall very pleased with how things have progressed. 04/18/19 Telehealth Evaluation During the COVID-19 National Emergency: Verbal Consent: Obtained from patient Allergies: reviewed and the active list is current. Medication changes: patient has no current medication changes. COVID-19 Screening: 1. Have you traveled internationally or on a cruise ship in the last 14 dayso No 2. Have you had contact with someone with or under investigation for COVID-19o No 3. Have you had a fever, cough, sore throat, or experiencing shortness of breatho No on evaluation today actually did have a visit with this patient through a telehealth encounter with her home health nurse. Subsequently it was noted that the patient actually appears to be doing okay in regard to her wounds both the right great toe as well as the left lateral malleolus have shown signs of  improvement although this in your theme around the left lateral malleolus there eschar coverings for both locations. The question is whether or not they are actually close and whether or not home health needs to discharge the patient or not. Nonetheless my concern is this point obviously is that without actually seeing her and being able to evaluate this directly I cannot ensure that she is completely healed which is the question that I'm being asked. 04/22/19 on evaluation today patient presents for her first evaluation since last time I saw her which was actually February 14, 2019. I did do a telehealth visit last week in which point it was questionable whether or not she may be healed and had to bring her in today for confirmation. With that being said  she does seem to be doing quite well at this point which is good news. There does not appear to be any drainage in the deed I believe her wounds may be healed. Readmission: 09/04/2019 on evaluation today patient appears to be doing unfortunately somewhat more poorly in regard to her left foot ulcer secondary to a wound that began on 08/21/2019 at least when she first noticed this. Fortunately she has not had any evidence of active infection at this time. Systemically. I also do not necessarily see any evidence of infection at the blister/wound site on the first metatarsal head plantar aspect. This almost appears to be something that may have just rubbed inappropriately causing this to breakdown. They did not want a wait too long to come in to be seen as again she had significant issues in the past with wounds that took quite a while to heal in fact it was close to 2 years. Nonetheless this does not appear to be quite that bad but again we do need to remove some of the necrotic tissue from the surface of the wound to tell exactly the extent. She does not appear to have any significant arterial disease at this point and again her last ABIs and TBI's are recorded above in the alert section her left ABI was 1.27 with a TBI of 0.72 to the right ABI 1.08 with a TBI of 0.39. Other than this the patient has been doing quite well since I last saw her and that was in May 2020. MAECI, POPIEL ([id number]) 09/11/2019 on evaluation today patient appeared to be doing very well with regard to her plantar foot ulcer on the left. In fact this appears to be almost completely healed which is awesome. That is after just 1 week of intervention. With that being said there is no signs of active infection at this time. 09/18/2019 on evaluation today patient actually appears to be doing excellent in fact she is completely healed based on what I am seeing at this point. Fortunately there is no signs of active infection at  this time and overall patient is very pleased to hear that this area has healed so quickly. Readmission: 05/13/2021 upon evaluation today this patient presents for reevaluation here in the clinic. This is a wound that actually we previously took care of. She had 1 on the right ankle and the left the left turned out to be be harder due to to heal but nonetheless is doing great at this point as the right that has reopened and it was noted first just several weeks ago with a scab over it and came off in just the past few days. Fortunately there does not appear to be any obvious evidence  of significant active infection at this time which is great news. No fevers, chills, nausea, vomiting, or diarrhea. 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 There does not appear to be any signs of this interfering in any way with her wound. She does have swelling we previously had compression socks for her ordered but again it does not look like she wears these on a regular basis by any means. 05/26/2021 upon evaluation today patient appears to be doing well with regard to her wound which is actually showing signs of excellent improvement. There does not appear to be any signs of active infection which is great news and overall very pleased with where things stand today. No fevers, chills, nausea, vomiting, or diarrhea. 06/02/2021 upon evaluation today patient's wound actually showing signs of excellent improvement. Fortunately there does not appear to be any signs of active infection which is great news. I think the patient is making good progress with regard to her wounds in general. 06/09/2021 upon evaluation today patient appears to be doing excellent in regard to her wounds currently. Fortunately there does not appear to be any signs of active infection which is great news. No fevers, chills, nausea, vomiting, or diarrhea. Overall extremely pleased with where  things stand today. I think the patient is making excellent progress. 06/16/2021 upon evaluation today patient appears to be doing well in regard to her wound. This is going require little bit of debridement today and that was discussed with the patient. Otherwise she seems to be doing quite well and I am actually very pleased with where things stand at this point. No fevers, chills, nausea, vomiting, or diarrhea. 06/23/2021 upon evaluation today patient appears to be doing well with regard to her wounds. She has been tolerating the dressing changes without complication. Fortunately there does not appear to be any evidence of infection and she has not had air in her home which she actually lives at an assisted living that got fixed this morning. With that being said because of that her wrap has been extremely hot and bothersome for her over the past week. 06/30/2021 upon evaluation today patient is actually making excellent progress in regard to her ankle ulcer. She has been tolerating the dressing changes without complication and overall extremely pleased with where things stand there does not appear to be any evidence of active infection which is great news. No fevers, chills, nausea, vomiting, or diarrhea. 07/07/21 upon evaluation today patients and culture on the right actually appears to be doing quite well. There does not appear to be any signs of infection and overall very pleased with where things stand today. No fevers, chills, nausea, or vomiting noted at this time. 07/14/2021 unfortunately the patient today has some evidence of deep tissue injury and pressure getting to the ankle region. Again I am not exactly sure what is going on here but this is very similar to issues that we have had in the past. I explained to the patient that she needs to be very mindful of exactly what is happening I think sleeping in bed is probably the main issue here although there could be other culprits I am  not sure what else would potentially lead to this kind of a problem for her. 07/21/2021 upon evaluation today patient's wound actually showing signs of improvement compared to last week. Fortunately there does not appear to be any signs of active infection which is great news and overall  very pleased with where things stand in that regard. With that being said I do believe that she is continuing to show signs of overall of getting better although I think this is still basically about what we were 2 weeks ago due to the worsening and now improvement. 07/28/2021 upon evaluation today patient appears to be doing well with regard to her wound. She does have some slough buildup on the surface of the wound which I would have to manage today. Fortunately there is no sign of active infection at this time. No fevers, chills, nausea, vomiting, or diarrhea. Objective Constitutional Well-nourished and well-hydrated in no acute distress. Vitals Time Taken: 11:09 AM, Height: 62 in, Weight: 150 lbs, BMI: 27.4, Temperature: 98.3 F, Pulse: 85 bpm, Respiratory Rate: 18 breaths/min, Blood Pressure: 148/80 mmHg. Respiratory normal breathing without difficulty. EVERETTE, COSTENBADER ([id number]) Psychiatric this patient is able to make decisions and demonstrates good insight into disease process. Alert and Oriented x 3. pleasant and cooperative. General Notes: Upon inspection patient's wound bed showed signs of good granulation epithelization at this point. Fortunately there does not appear to be any signs of active infection which is great news and overall I am extremely pleased with where we stand I did perform some sharp debridement clear away some of the necrotic debris she tolerated that today without complication and postdebridement wound bed appears to be doing much better which is great news. Integumentary (Hair, Skin) Wound #7 status is Open. Original cause of wound was Gradually Appeared. The date acquired  was: 05/12/2021. The wound has been in treatment 10 weeks. The wound is located on the Right,Lateral Malleolus. The wound measures 0.7cm length x 0.6cm width x 0.2cm depth; 0.33cm^2 area and 0.066cm^3 volume. There is Fat Layer (Subcutaneous Tissue) exposed. There is no tunneling or undermining noted. There is a medium amount of serosanguineous drainage noted. The wound margin is distinct with the outline attached to the wound base. There is large (67-100%) red, pink, pale granulation within the wound bed. There is a small (1-33%) amount of necrotic tissue within the wound bed including Adherent Slough. Assessment Active Problems ICD-10 Pressure ulcer of right ankle, stage 3 Lymphedema, not elsewhere classified Venous insufficiency (chronic) (peripheral) Presence of cardiac pacemaker Long term (current) use of anticoagulants Procedures Wound #7 Pre-procedure diagnosis of Wound #7 is a Pressure Ulcer located on the Right,Lateral Malleolus . There was a Excisional Skin/Subcutaneous Tissue Debridement with a total area of 0.42 sq cm performed by Tommie Sams., PA-C. With the following instrument(s): Curette to remove Viable and Non-Viable tissue/material. Material removed includes Subcutaneous Tissue, Slough, Skin: Dermis, and Skin: Epidermis after achieving pain control using Lidocaine 4% Topical Solution. No specimens were taken. A time out was conducted at 11:30, prior to the start of the procedure. A Minimum amount of bleeding was controlled with Pressure. The procedure was tolerated well with a pain level of 0 throughout and a pain level of 0 following the procedure. Post Debridement Measurements: 0.7cm length x 0.6cm width x 0.2cm depth; 0.066cm^3 volume. Post debridement Stage noted as Category/Stage III. Character of Wound/Ulcer Post Debridement is improved. Post procedure Diagnosis Wound #7: Same as Pre-Procedure Pre-procedure diagnosis of Wound #7 is a Pressure Ulcer located on the  Right,Lateral Malleolus . There was a Three Layer Compression Therapy Procedure by Carlene Coria, RN. Post procedure Diagnosis Wound #7: Same as Pre-Procedure Plan Follow-up Appointments: Return Appointment in 1 week. Bathing/ Shower/ Hygiene: May shower with wound dressing protected with water repellent cover  or cast protector. Edema Control - Lymphedema / Segmental Compressive Device / Other: Optional: One layer of unna paste to top of compression wrap (to act as an anchor). - and at toes Elevate, Exercise Daily and Avoid Standing for Long Periods of Time. Elevate legs to the level of the heart and pump ankles as often as possible Elevate leg(s) parallel to the floor when sitting. WOUND #7: - Malleolus Wound Laterality: Right, Lateral Cleanser: Soap and Water 1 x Per Week/30 Days Discharge Instructions: Gently cleanse wound with antibacterial soap, rinse and pat dry prior to dressing wounds Primary Dressing: Prisma 4.34 (in) 1 x Per Week/30 Days Discharge Instructions: Moisten w/normal saline or sterile water; Cover wound as directed. Do not remove from wound bed. LINETTE, OSTERMEYER ([id number]) Primary Dressing: Curad Oil Emulsion Dressing 3x3 (in/in) 1 x Per Week/30 Days Discharge Instructions: over prisma Secondary Dressing: ABD Pad 5x9 (in/in) 1 x Per Week/30 Days Discharge Instructions: Cover with ABD pad Compression Wrap: Profore Lite LF 3 Multilayer Compression Bandaging System 1 x Per Week/30 Days Discharge Instructions: Apply 3 multi-layer wrap as prescribed. 1. Would recommend that we going continue with the wound care measures as before and the patient is in agreement with plan. This includes the use of the silver collagen dressing followed by the oil emulsion to keep it nice and moist. 2. We will cover with an ABD pad. 3. I am also can recommend that we continue with a 3 layer compression wrap which I think is doing a great job. We will see patient back for reevaluation in  1 week here in the clinic. If anything worsens or changes patient will contact our office for additional recommendations. Electronic Signature(s) Signed: 07/28/2021 11:37:37 AM By: Worthy Keeler PA-C Entered By: Worthy Keeler on 07/28/2021 11:37:37 Stiehl, [name] ([id number]) -------------------------------------------------------------------------------- SuperBill Details Patient Name: [name] Date of Service: 07/28/2021 Medical Record Number: [id number] Patient Account Number: [id number] Date of Birth/Sex: 07-04-1923 (87 y.o. F) Treating RN: Carlene Coria Primary Care Provider: Adrian Prows Other Clinician: Referring Provider: Adrian Prows Treating Provider/Extender: Skipper Cliche in Treatment: 10 Diagnosis Coding ICD-10 Codes Code Description [phone] Pressure ulcer of right ankle, stage 3 I89.0 Lymphedema, not elsewhere classified I87.2 Venous insufficiency (chronic) (peripheral) Z95.0 Presence of cardiac pacemaker Z79.01 Long term (current) use of anticoagulants Facility Procedures CPT4 Code: [id number] Description: [id number] - DEB SUBQ TISSUE 20 SQ CM/< Modifier: Quantity: 1 CPT4 Code: Description: ICD-10 Diagnosis Description 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, stage 3 Modifier: Quantity: Physician Procedures CPT4 Code: [id number] Description: [id number] - WC PHYS SUBQ TISS 20 SQ CM Modifier: Quantity: 1 CPT4 Code: Description: ICD-10 Diagnosis Description 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, stage 3 Modifier: Quantity: Electronic Signature(s) Signed: 07/28/2021 11:37:51 AM By: Worthy Keeler PA-C Entered By: Worthy Keeler on 07/28/2021 11:37:50

## 2021-08-01 NOTE — Progress Notes (Signed)
DAJANAE, BROPHY ([id number]) Visit Report for 07/28/2021 Arrival Information Details Patient Name: [name], [name]. Date of Service: 07/28/2021 11:00 AM Medical Record Number: [id number] Patient Account Number: [id number] Date of Birth/Sex: 08-16-1923 (87 y.o. F) Treating RN: Carlene Coria Primary Care Le Faulcon: Adrian Prows Other Clinician: Referring Kollen Armenti: Adrian Prows Treating Breland Trouten/Extender: Skipper Cliche in Treatment: 10 Visit Information History Since Last Visit All ordered tests and consults were completed: No Patient Arrived: [name] Added or deleted any medications: No Arrival Time: 11:09 Any new allergies or adverse reactions: No Accompanied By: self Had a fall or experienced change in No Transfer Assistance: None activities of daily living that may affect Patient Identification Verified: Yes risk of falls: Secondary Verification Process Completed: Yes Signs or symptoms of abuse/neglect since last visito No Patient Requires Transmission-Based No Hospitalized since last visit: No Precautions: Implantable device outside of the clinic excluding No Patient Has Alerts: Yes cellular tissue based products placed in the center Patient Alerts: Patient on Blood since last visit: Thinner Has Dressing in Place as Prescribed: Yes NOT DIABETIC Has Compression in Place as Prescribed: Yes ***ELIQUIS*** Pain Present Now: No Electronic Signature(s) Signed: 08/01/2021 1:47:40 PM By: Carlene Coria RN Entered By: Carlene Coria on 07/28/2021 11:09:29 Carrell, [name] ([id number]) -------------------------------------------------------------------------------- Clinic Level of Care Assessment Details Patient Name: [name] Date of Service: 07/28/2021 11:00 AM Medical Record Number: [id number] Patient Account Number: [id number] Date of Birth/Sex: [birth year] (87 y.o. F) Treating RN: Carlene Coria Primary Care De Libman: Adrian Prows Other  Clinician: Referring Oshen Wlodarczyk: Adrian Prows Treating Lessie Manigo/Extender: Skipper Cliche in Treatment: 10 Clinic Level of Care Assessment Items TOOL 1 Quantity Score _0  - Use when EandM and Procedure is performed on INITIAL visit 0 ASSESSMENTS - Nursing Assessment / Reassessment _1  - General Physical Exam (combine w/ comprehensive assessment (listed just below) when performed on new 0 pt. evals) _2  - 0 Comprehensive Assessment (HX, ROS, Risk Assessments, Wounds Hx, etc.) ASSESSMENTS - Wound and Skin Assessment / Reassessment _3  - Dermatologic / Skin Assessment (not related to wound area) 0 ASSESSMENTS - Ostomy and/or Continence Assessment and Care _4  - Incontinence Assessment and Management 0 _5  - 0 Ostomy Care Assessment and Management (repouching, etc.) PROCESS - Coordination of Care _6  - Simple Patient / Family Education for ongoing care 0 _7  - 0 Complex (extensive) Patient / Family Education for ongoing care _8  - 0 Staff obtains Programmer, systems, Records, Test Results / Process Orders _9  - 0 Staff telephones HHA, Nursing Homes / Clarify orders / etc _10  - 0 Routine Transfer to another Facility (non-emergent condition) _11  - 0 Routine Hospital Admission (non-emergent condition) _12  - 0 New Admissions / Biomedical engineer / Ordering NPWT, Apligraf, etc. _13  - 0 Emergency Hospital Admission (emergent condition) PROCESS - Special Needs _14  - Pediatric / Minor Patient Management 0 _15  - 0 Isolation Patient Management _16  - 0 Hearing / Language / Visual special needs _17  - 0 Assessment of Community assistance (transportation, D/C planning, etc.) _18  - 0 Additional assistance / Altered mentation _19  - 0 Support Surface(s) Assessment (bed, cushion, seat, etc.) INTERVENTIONS - Miscellaneous _20  - External ear exam 0 _21  - 0 Patient Transfer (multiple staff / Civil Service fast streamer / Similar devices) _22  - 0 Simple Staple / Suture removal (25 or less) _23  - 0 Complex Staple / Suture removal  (26 or more) _24  - 0 Hypo/Hyperglycemic Management (do not check if billed separately) _25  - 0 Ankle / Brachial Index (ABI) - do not check if billed separately Has the  patient been seen at the hospital within the last three years: Yes Total Score: 0 Level Of Care: ____ [name] ([id number]) Electronic Signature(s) Signed: 08/01/2021 1:47:40 PM By: Carlene Coria RN Entered By: Carlene Coria on 07/28/2021 11:32:59 Akhavan, [name] ([id number]) -------------------------------------------------------------------------------- Compression Therapy Details Patient Name: [name] Date of Service: 07/28/2021 11:00 AM Medical Record Number: [id number] Patient Account Number: [id number] Date of Birth/Sex: 1922-12-29 (87 y.o. F) Treating RN: Carlene Coria Primary Care Admiral Marcucci: Adrian Prows Other Clinician: Referring Ashaad Gaertner: Adrian Prows Treating Patricia Fargo/Extender: Skipper Cliche in Treatment: 10 Compression Therapy Performed for Wound Assessment: Wound #7 Right,Lateral Malleolus Performed By: Jake Church, RN Compression Type: Three Layer Post Procedure Diagnosis Same as Pre-procedure Electronic Signature(s) Signed: 08/01/2021 1:47:40 PM By: Carlene Coria RN Entered By: Carlene Coria on 07/28/2021 11:31:50 Bebout, [name] ([id number]) -------------------------------------------------------------------------------- Encounter Discharge Information Details Patient Name: [name] Date of Service: 07/28/2021 11:00 AM Medical Record Number: [id number] Patient Account Number: [id number] Date of Birth/Sex: [birth year] (87 y.o. F) Treating RN: Carlene Coria Primary Care Keithen Capo: Adrian Prows Other Clinician: Referring Aaylah Pokorny: Adrian Prows Treating Alanis Clift/Extender: Skipper Cliche in Treatment: 10 Encounter Discharge Information Items Post Procedure Vitals Discharge Condition: Stable Temperature (F): 98.3 Ambulatory Status:  Walker Pulse (bpm): 85 Discharge Destination: Home Respiratory Rate (breaths/min): 18 Transportation: Private Auto Blood Pressure (mmHg): 148/80 Accompanied By: son Schedule Follow-up Appointment: Yes Clinical Summary of Care: Patient Declined Electronic Signature(s) Signed: 08/01/2021 1:47:40 PM By: Carlene Coria RN Entered By: Carlene Coria on 07/28/2021 11:53:48 Patin, [name] ([id number]) -------------------------------------------------------------------------------- Lower Extremity Assessment Details Patient Name: [name] Date of Service: 07/28/2021 11:00 AM Medical Record Number: [id number] Patient Account Number: [id number] Date of Birth/Sex: Apr 29, 1923 (87 y.o. F) Treating RN: Carlene Coria Primary Care Inda Mcglothen: Adrian Prows Other Clinician: Referring Haydon Kalmar: Adrian Prows Treating Laurelle Skiver/Extender: Skipper Cliche in Treatment: 10 Edema Assessment Assessed: Shirlyn Goltz: No] [Right: No] Edema: [Left: Ye] [Right: s] Calf Left: Right: Point of Measurement: 33 cm From Medial Instep 29 cm Ankle Left: Right: Point of Measurement: 10 cm From Medial Instep 17 cm Vascular Assessment Pulses: Dorsalis Pedis Palpable: [Right:Yes] Electronic Signature(s) Signed: 08/01/2021 1:47:40 PM By: Carlene Coria RN Entered By: Carlene Coria on 07/28/2021 11:25:39 Linenberger, [name] ([id number]) -------------------------------------------------------------------------------- Multi Wound Chart Details Patient Name: [name] Date of Service: 07/28/2021 11:00 AM Medical Record Number: [id number] Patient Account Number: [id number] Date of Birth/Sex: 01-07-1923 (87 y.o. F) Treating RN: Carlene Coria Primary Care Naeem Quillin: Adrian Prows Other Clinician: Referring Lezli Danek: Adrian Prows Treating Jakala Herford/Extender: Skipper Cliche in Treatment: 10 Vital Signs Height(in): 15 Pulse(bpm): 40 Weight(lbs): 150 Blood Pressure(mmHg): 148/80 Body Mass  Index(BMI): 27 Temperature(F): 98.3 Respiratory Rate(breaths/min): 18 Photos: [N/A:N/A] Wound Location: Right, Lateral Malleolus N/A N/A Wounding Event: Gradually Appeared N/A N/A Primary Etiology: Pressure Ulcer N/A N/A Comorbid History: Cataracts, Congestive Heart Failure, N/A N/A Hypertension, Peripheral Arterial Disease, Osteoarthritis, Neuropathy Date Acquired: 05/12/2021 N/A N/A Weeks of Treatment: 10 N/A N/A Wound Status: Open N/A N/A Measurements L x W x D (cm) 0.7x0.6x0.2 N/A N/A Area (cm) : 0.33 N/A N/A Volume (cm) : 0.066 N/A N/A % Reduction in Area: 70.60% N/A N/A % Reduction in Volume: 70.70% N/A N/A Classification: Category/Stage III N/A N/A Exudate Amount: Medium N/A N/A Exudate Type: Serosanguineous N/A N/A Exudate Color: red, brown N/A N/A Wound Margin: Distinct, outline attached N/A N/A Granulation Amount: Large (67-100%) N/A N/A Granulation Quality: Red, Pink, Pale N/A N/A Necrotic Amount: Small (1-33%) N/A N/A Exposed Structures: Fat Layer (Subcutaneous  Tissue): N/A N/A Yes Fascia: No Tendon: No Muscle: No Joint: No Bone: No Epithelialization: Small (1-33%) N/A N/A Treatment Notes Electronic Signature(s) Signed: 08/01/2021 1:47:40 PM By: Carlene Coria RN Entered By: Carlene Coria on 07/28/2021 11:30:18 Steinruck, [name] ([id number]) -------------------------------------------------------------------------------- [place] Details Patient Name: [name] Date of Service: 07/28/2021 11:00 AM Medical Record Number: [id number] Patient Account Number: [id number] Date of Birth/Sex: May 13, 1923 (87 y.o. F) Treating RN: Carlene Coria Primary Care Steele Stracener: Adrian Prows Other Clinician: Referring Mazal Ebey: Adrian Prows Treating Jaskiran Pata/Extender: Skipper Cliche in Treatment: 10 Active Inactive Wound/Skin Impairment Nursing Diagnoses: Knowledge deficit related to ulceration/compromised skin  integrity Goals: Patient/caregiver will verbalize understanding of skin care regimen Date Initiated: 05/13/2021 Date Inactivated: 05/13/2021 Target Resolution Date: 06/12/2021 Goal Status: Met Ulcer/skin breakdown will have a volume reduction of 30% by week 4 Date Initiated: 05/13/2021 Date Inactivated: 06/16/2021 Target Resolution Date: 06/12/2021 Goal Status: Met Ulcer/skin breakdown will have a volume reduction of 50% by week 8 Date Initiated: 05/13/2021 Target Resolution Date: 07/13/2021 Goal Status: Active Ulcer/skin breakdown will have a volume reduction of 80% by week 12 Date Initiated: 05/13/2021 Target Resolution Date: 08/13/2021 Goal Status: Active Ulcer/skin breakdown will heal within 14 weeks Date Initiated: 05/13/2021 Target Resolution Date: 09/12/2021 Goal Status: Active Interventions: Assess patient/caregiver ability to obtain necessary supplies Assess patient/caregiver ability to perform ulcer/skin care regimen upon admission and as needed Assess ulceration(s) every visit Notes: Electronic Signature(s) Signed: 08/01/2021 1:47:40 PM By: Carlene Coria RN Entered By: Carlene Coria on 07/28/2021 11:30:06 Loftus, [name] ([id number]) -------------------------------------------------------------------------------- Pain Assessment Details Patient Name: [name] Date of Service: 07/28/2021 11:00 AM Medical Record Number: [id number] Patient Account Number: [id number] Date of Birth/Sex: 01-12-1923 (87 y.o. F) Treating RN: Carlene Coria Primary Care Villa Burgin: Adrian Prows Other Clinician: Referring Malasia Torain: Adrian Prows Treating Izic Stfort/Extender: Skipper Cliche in Treatment: 10 Active Problems Location of Pain Severity and Description of Pain Patient Has Paino No Site Locations Pain Management and Medication Current Pain Management: Electronic Signature(s) Signed: 08/01/2021 1:47:40 PM By: Carlene Coria RN Entered By: Carlene Coria on 07/28/2021  11:10:12 Loa, [name] ([id number]) -------------------------------------------------------------------------------- Patient/Caregiver Education Details Patient Name: [name] Date of Service: 07/28/2021 11:00 AM Medical Record Number: [id number] Patient Account Number: [id number] Date of Birth/Gender: 03/16/1923 (87 y.o. F) Treating RN: Carlene Coria Primary Care Physician: Adrian Prows Other Clinician: Referring Physician: Adrian Prows Treating Physician/Extender: Skipper Cliche in Treatment: 10 Education Assessment Education Provided To: Patient Education Topics Provided Wound/Skin Impairment: Methods: Explain/Verbal Responses: State content correctly Electronic Signature(s) Signed: 08/01/2021 1:47:40 PM By: Carlene Coria RN Entered By: Carlene Coria on 07/28/2021 11:35:36 Weatherwax, [name] ([id number]) -------------------------------------------------------------------------------- Wound Assessment Details Patient Name: [name] Date of Service: 07/28/2021 11:00 AM Medical Record Number: [id number] Patient Account Number: [id number] Date of Birth/Sex: Jan 18, 1923 (87 y.o. F) Treating RN: Carlene Coria Primary Care Treyshon Buchanon: Adrian Prows Other Clinician: Referring Tamika Shropshire: Adrian Prows Treating Randen Kauth/Extender: Skipper Cliche in Treatment: 10 Wound Status Wound Number: 7 Primary Pressure Ulcer Etiology: Wound Location: Right, Lateral Malleolus Wound Open Wounding Event: Gradually Appeared Status: Date Acquired: 05/12/2021 Comorbid Cataracts, Congestive Heart Failure, Hypertension, Weeks Of Treatment: 10 History: Peripheral Arterial Disease, Osteoarthritis, Neuropathy Clustered Wound: No Photos Wound Measurements Length: (cm) 0.7 Width: (cm) 0.6 Depth: (cm) 0.2 Area: (cm) 0.33 Volume: (cm) 0.066 % Reduction in Area: 70.6% % Reduction in Volume: 70.7% Epithelialization: Small (1-33%) Tunneling: No Undermining:  No Wound Description Classification: Category/Stage III Wound Margin: Distinct, outline attached Exudate Amount: Medium Exudate  Type: Serosanguineous Exudate Color: red, brown Foul Odor After Cleansing: No Slough/Fibrino Yes Wound Bed Granulation Amount: Large (67-100%) Exposed Structure Granulation Quality: Red, Pink, Pale Fascia Exposed: No Necrotic Amount: Small (1-33%) Fat Layer (Subcutaneous Tissue) Exposed: Yes Necrotic Quality: Adherent Slough Tendon Exposed: No Muscle Exposed: No Joint Exposed: No Bone Exposed: No Treatment Notes Wound #7 (Malleolus) Wound Laterality: Right, Lateral Cleanser Soap and Water Discharge Instruction: Gently cleanse wound with antibacterial soap, rinse and pat dry prior to dressing wounds Peri-Wound Care Ilic, [name] ([id number]) Topical Primary Dressing Prisma 4.34 (in) Discharge Instruction: Moisten w/normal saline or sterile water; Cover wound as directed. Do not remove from wound bed. Curad Oil Emulsion Dressing 3x3 (in/in) Discharge Instruction: over prisma Secondary Dressing ABD Pad 5x9 (in/in) Discharge Instruction: Cover with ABD pad Secured With Compression Wrap Profore Lite LF 3 Multilayer Compression Bandaging System Discharge Instruction: Apply 3 multi-layer wrap as prescribed. Compression Stockings Add-Ons Electronic Signature(s) Signed: 08/01/2021 1:47:40 PM By: Carlene Coria RN Entered By: Carlene Coria on 07/28/2021 11:24:48 Ballengee, [name] ([id number]) -------------------------------------------------------------------------------- Vitals Details Patient Name: [name] Date of Service: 07/28/2021 11:00 AM Medical Record Number: [id number] Patient Account Number: [id number] Date of Birth/Sex: 11/09/1923 (87 y.o. F) Treating RN: Carlene Coria Primary Care Cleone Hulick: Adrian Prows Other Clinician: Referring Omarr Hann: Adrian Prows Treating Yesenia Fontenette/Extender: Skipper Cliche in Treatment:  10 Vital Signs Time Taken: 11:09 Temperature (F): 98.3 Height (in): 62 Pulse (bpm): 85 Weight (lbs): 150 Respiratory Rate (breaths/min): 18 Body Mass Index (BMI): 27.4 Blood Pressure (mmHg): 148/80 Reference Range: 80 - 120 mg / dl Electronic Signature(s) Signed: 08/01/2021 1:47:40 PM By: Carlene Coria RN Entered By: Carlene Coria on 07/28/2021 11:10:02

## 2021-08-04 ENCOUNTER — Encounter: Payer: Medicare Other | Attending: Physician Assistant | Admitting: Physician Assistant

## 2021-08-04 ENCOUNTER — Other Ambulatory Visit: Payer: Self-pay

## 2021-08-04 ENCOUNTER — Other Ambulatory Visit: Payer: Self-pay | Admitting: Physician Assistant

## 2021-08-04 DIAGNOSIS — I872 Venous insufficiency (chronic) (peripheral): Secondary | ICD-10-CM | POA: Diagnosis not present

## 2021-08-04 DIAGNOSIS — Z950 Presence of cardiac pacemaker: Secondary | ICD-10-CM | POA: Insufficient documentation

## 2021-08-04 DIAGNOSIS — Z881 Allergy status to other antibiotic agents status: Secondary | ICD-10-CM | POA: Diagnosis not present

## 2021-08-04 DIAGNOSIS — Z7901 Long term (current) use of anticoagulants: Secondary | ICD-10-CM | POA: Insufficient documentation

## 2021-08-04 DIAGNOSIS — M1990 Unspecified osteoarthritis, unspecified site: Secondary | ICD-10-CM | POA: Diagnosis not present

## 2021-08-04 DIAGNOSIS — L89513 Pressure ulcer of right ankle, stage 3: Secondary | ICD-10-CM | POA: Insufficient documentation

## 2021-08-04 DIAGNOSIS — I890 Lymphedema, not elsewhere classified: Secondary | ICD-10-CM | POA: Insufficient documentation

## 2021-08-04 DIAGNOSIS — G629 Polyneuropathy, unspecified: Secondary | ICD-10-CM | POA: Diagnosis not present

## 2021-08-04 NOTE — Progress Notes (Addendum)
[name], [name] ([id number]) Visit Report for 08/04/2021 Arrival Information Details Patient Name: [name] Date of Service: 08/04/2021 10:15 AM Medical Record Number: [id number] Patient Account Number: [id number] Date of Birth/Sex: 10/18/1923 (87 y.o. F) Treating RN: Carlene Coria Primary Care Deontay Ladnier: Adrian Prows Other Clinician: Referring Keeli Roberg: Adrian Prows Treating Delilah Mulgrew/Extender: Skipper Cliche in Treatment: 11 Visit Information History Since Last Visit All ordered tests and consults were completed: No Patient Arrived: [name] Added or deleted any medications: No Arrival Time: 10:16 Any new allergies or adverse reactions: No Accompanied By: self Had a fall or experienced change in No Transfer Assistance: None activities of daily living that may affect Patient Identification Verified: Yes risk of falls: Secondary Verification Process Completed: Yes Signs or symptoms of abuse/neglect since last visito No Patient Requires Transmission-Based No Hospitalized since last visit: No Precautions: Implantable device outside of the clinic excluding No Patient Has Alerts: Yes cellular tissue based products placed in the center Patient Alerts: Patient on Blood since last visit: Thinner Has Dressing in Place as Prescribed: Yes NOT DIABETIC Has Compression in Place as Prescribed: Yes ***ELIQUIS*** Pain Present Now: No Electronic Signature(s) Signed: 08/05/2021 7:59:11 PM By: Carlene Coria RN Entered By: Carlene Coria on 08/04/2021 10:21:04 [name] ([id number]) -------------------------------------------------------------------------------- Clinic Level of Care Assessment Details Patient Name: [name] Date of Service: 08/04/2021 10:15 AM Medical Record Number: [id number] Patient Account Number: [id number] Date of Birth/Sex: 03-19-1923 (87 y.o. F) Treating RN: Carlene Coria Primary Care Azarya Oconnell: Adrian Prows Other  Clinician: Referring Tamsyn Owusu: Adrian Prows Treating Marieli Rudy/Extender: Skipper Cliche in Treatment: 11 Clinic Level of Care Assessment Items TOOL 1 Quantity Score _0  - Use when EandM and Procedure is performed on INITIAL visit 0 ASSESSMENTS - Nursing Assessment / Reassessment _1  - General Physical Exam (combine w/ comprehensive assessment (listed just below) when performed on new 0 pt. evals) _2  - 0 Comprehensive Assessment (HX, ROS, Risk Assessments, Wounds Hx, etc.) ASSESSMENTS - Wound and Skin Assessment / Reassessment _3  - Dermatologic / Skin Assessment (not related to wound area) 0 ASSESSMENTS - Ostomy and/or Continence Assessment and Care _4  - Incontinence Assessment and Management 0 _5  - 0 Ostomy Care Assessment and Management (repouching, etc.) PROCESS - Coordination of Care _6  - Simple Patient / Family Education for ongoing care 0 _7  - 0 Complex (extensive) Patient / Family Education for ongoing care _8  - 0 Staff obtains Programmer, systems, Records, Test Results / Process Orders _9  - 0 Staff telephones HHA, Nursing Homes / Clarify orders / etc _10  - 0 Routine Transfer to another Facility (non-emergent condition) _11  - 0 Routine Hospital Admission (non-emergent condition) _12  - 0 New Admissions / Biomedical engineer / Ordering NPWT, Apligraf, etc. _13  - 0 Emergency Hospital Admission (emergent condition) PROCESS - Special Needs _14  - Pediatric / Minor Patient Management 0 _15  - 0 Isolation Patient Management _16  - 0 Hearing / Language / Visual special needs _17  - 0 Assessment of Community assistance (transportation, D/C planning, etc.) _18  - 0 Additional assistance / Altered mentation _19  - 0 Support Surface(s) Assessment (bed, cushion, seat, etc.) INTERVENTIONS - Miscellaneous _20  - External ear exam 0 _21  - 0 Patient Transfer (multiple staff / Civil Service fast streamer / Similar devices) _22  - 0 Simple Staple / Suture removal (25 or less) _23  - 0 Complex Staple / Suture removal  (26 or more) _24  - 0 Hypo/Hyperglycemic Management (do not check if billed separately) _25  - 0 Ankle / Brachial Index (ABI) - do not check if billed separately Has the  patient been seen at the hospital within the last three years: Yes Total Score: 0 Level Of Care: ____ [name] ([id number]) Electronic Signature(s) Signed: 08/05/2021 7:59:11 PM By: Carlene Coria RN Entered By: Carlene Coria on 08/04/2021 10:43:47 [name] ([id number]) -------------------------------------------------------------------------------- Encounter Discharge Information Details Patient Name: [name] Date of Service: 08/04/2021 10:15 AM Medical Record Number: [id number] Patient Account Number: [id number] Date of Birth/Sex: 1923/08/31 (87 y.o. F) Treating RN: Carlene Coria Primary Care Adine Heimann: Adrian Prows Other Clinician: Referring Talley Casco: Adrian Prows Treating Chesni Vos/Extender: Skipper Cliche in Treatment: 11 Encounter Discharge Information Items Post Procedure Vitals Discharge Condition: Stable Temperature (F): 98 Ambulatory Status: Walker Pulse (bpm): 85 Discharge Destination: Home Respiratory Rate (breaths/min): 16 Transportation: Private Auto Blood Pressure (mmHg): 143/67 Accompanied By: self Schedule Follow-up Appointment: Yes Clinical Summary of Care: Patient Declined Electronic Signature(s) Signed: 08/05/2021 7:59:11 PM By: Carlene Coria RN Entered By: Carlene Coria on 08/04/2021 10:45:02 Pinkstaff, [name] ([id number]) -------------------------------------------------------------------------------- Lower Extremity Assessment Details Patient Name: [name] Date of Service: 08/04/2021 10:15 AM Medical Record Number: [id number] Patient Account Number: [id number] Date of Birth/Sex: Jul 04, 1923 (87 y.o. F) Treating RN: Carlene Coria Primary Care Noemi Ishmael: Adrian Prows Other Clinician: Referring Katelee Schupp: Adrian Prows Treating  Siya Flurry/Extender: Skipper Cliche in Treatment: 11 Edema Assessment Assessed: [Left: No] Patrice Paradise: No] Edema: [Left: Ye] [Right: s] Calf Left: Right: Point of Measurement: 33 cm From Medial Instep 28 cm Ankle Left: Right: Point of Measurement: 10 cm From Medial Instep 17 cm Vascular Assessment Pulses: Dorsalis Pedis Palpable: [Right:Yes] Electronic Signature(s) Signed: 08/05/2021 7:59:11 PM By: Carlene Coria RN Entered By: Carlene Coria on 08/04/2021 10:29:21 [name] ([id number]) -------------------------------------------------------------------------------- Multi Wound Chart Details Patient Name: [name] Date of Service: 08/04/2021 10:15 AM Medical Record Number: [id number] Patient Account Number: [id number] Date of Birth/Sex: 01-17-1923 (87 y.o. F) Treating RN: Carlene Coria Primary Care Alaisha Eversley: Adrian Prows Other Clinician: Referring [employer] Null: Adrian Prows Treating Wadsworth Skolnick/Extender: Skipper Cliche in Treatment: 11 Vital Signs Height(in): 62 Pulse(bpm): 24 Weight(lbs): 150 Blood Pressure(mmHg): 143/67 Body Mass Index(BMI): 27 Temperature(F): 98 Respiratory Rate(breaths/min): 16 Photos: [N/A:N/A] Wound Location: Right, Lateral Malleolus N/A N/A Wounding Event: Gradually Appeared N/A N/A Primary Etiology: Pressure Ulcer N/A N/A Comorbid History: Cataracts, Congestive Heart Failure, N/A N/A Hypertension, Peripheral Arterial Disease, Osteoarthritis, Neuropathy Date Acquired: 05/12/2021 N/A N/A Weeks of Treatment: 11 N/A N/A Wound Status: Open N/A N/A Measurements L x W x D (cm) 1x1x1.2 N/A N/A Area (cm) : 0.785 N/A N/A Volume (cm) : 0.942 N/A N/A % Reduction in Area: 30.10% N/A N/A % Reduction in Volume: -318.70% N/A N/A Classification: Category/Stage III N/A N/A Exudate Amount: Medium N/A N/A Exudate Type: Serosanguineous N/A N/A Exudate Color: red, brown N/A N/A Wound Margin: Distinct, outline attached N/A N/A Granulation  Amount: Small (1-33%) N/A N/A Granulation Quality: Red, Pink, Pale N/A N/A Necrotic Amount: Large (67-100%) N/A N/A Exposed Structures: Fat Layer (Subcutaneous Tissue): N/A N/A Yes Fascia: No Tendon: No Muscle: No Joint: No Bone: No Epithelialization: Small (1-33%) N/A N/A Treatment Notes Electronic Signature(s) Signed: 08/05/2021 7:59:11 PM By: Carlene Coria RN Entered By: Carlene Coria on 08/04/2021 10:33:25 Barhorst, [name] ([id number]) -------------------------------------------------------------------------------- Multi-Disciplinary Care Plan Details Patient Name: [name] Date of Service: 08/04/2021 10:15 AM Medical Record Number: [id number] Patient Account Number: [id number] Date of Birth/Sex: 05-15-1923 (87 y.o. F) Treating RN: Carlene Coria Primary Care Zak Gondek: Adrian Prows Other Clinician: Referring Michell Giuliano: Adrian Prows Treating Yaneisy Wenz/Extender: Skipper Cliche in Treatment: 11 Active Inactive Wound/Skin Impairment  Nursing Diagnoses: Knowledge deficit related to ulceration/compromised skin integrity Goals: Patient/caregiver will verbalize understanding of skin care regimen Date Initiated: 05/13/2021 Date Inactivated: 05/13/2021 Target Resolution Date: 06/12/2021 Goal Status: Met Ulcer/skin breakdown will have a volume reduction of 30% by week 4 Date Initiated: 05/13/2021 Date Inactivated: 06/16/2021 Target Resolution Date: 06/12/2021 Goal Status: Met Ulcer/skin breakdown will have a volume reduction of 50% by week 8 Date Initiated: 05/13/2021 Target Resolution Date: 07/13/2021 Goal Status: Active Ulcer/skin breakdown will have a volume reduction of 80% by week 12 Date Initiated: 05/13/2021 Target Resolution Date: 08/13/2021 Goal Status: Active Ulcer/skin breakdown will heal within 14 weeks Date Initiated: 05/13/2021 Target Resolution Date: 09/12/2021 Goal Status: Active Interventions: Assess patient/caregiver ability to obtain necessary  supplies Assess patient/caregiver ability to perform ulcer/skin care regimen upon admission and as needed Assess ulceration(s) every visit Notes: Electronic Signature(s) Signed: 08/05/2021 7:59:11 PM By: Carlene Coria RN Entered By: Carlene Coria on 08/04/2021 10:33:14 Mudrick, [name] ([id number]) -------------------------------------------------------------------------------- Pain Assessment Details Patient Name: [name] Date of Service: 08/04/2021 10:15 AM Medical Record Number: [id number] Patient Account Number: [id number] Date of Birth/Sex: Oct 09, 1923 (87 y.o. F) Treating RN: Carlene Coria Primary Care Noel Rodier: Adrian Prows Other Clinician: Referring Breionna Punt: Adrian Prows Treating Zierra Laroque/Extender: Skipper Cliche in Treatment: 11 Active Problems Location of Pain Severity and Description of Pain Patient Has Paino No Site Locations Pain Management and Medication Current Pain Management: Electronic Signature(s) Signed: 08/05/2021 7:59:11 PM By: Carlene Coria RN Entered By: Carlene Coria on 08/04/2021 10:21:34 Carthen, [name] ([id number]) -------------------------------------------------------------------------------- Patient/Caregiver Education Details Patient Name: [name] Date of Service: 08/04/2021 10:15 AM Medical Record Number: [id number] Patient Account Number: [id number] Date of Birth/Gender: [birth year] (87 y.o. F) Treating RN: Carlene Coria Primary Care Physician: Adrian Prows Other Clinician: Referring Physician: Adrian Prows Treating Physician/Extender: Skipper Cliche in Treatment: 11 Education Assessment Education Provided To: Patient Education Topics Provided Wound/Skin Impairment: Methods: Explain/Verbal Responses: State content correctly Electronic Signature(s) Signed: 08/05/2021 7:59:11 PM By: Carlene Coria RN Entered By: Carlene Coria on 08/04/2021 10:44:06 Simao, [name]  ([id number]) -------------------------------------------------------------------------------- Wound Assessment Details Patient Name: [name] Date of Service: 08/04/2021 10:15 AM Medical Record Number: [id number] Patient Account Number: [id number] Date of Birth/Sex: Jul 23, 1923 (87 y.o. F) Treating RN: Carlene Coria Primary Care Diem Dicocco: Adrian Prows Other Clinician: Referring Aydien Majette: Adrian Prows Treating Falcon Mccaskey/Extender: Skipper Cliche in Treatment: 11 Wound Status Wound Number: 7 Primary Pressure Ulcer Etiology: Wound Location: Right, Lateral Malleolus Wound Open Wounding Event: Gradually Appeared Status: Date Acquired: 05/12/2021 Comorbid Cataracts, Congestive Heart Failure, Hypertension, Weeks Of Treatment: 11 History: Peripheral Arterial Disease, Osteoarthritis, Neuropathy Clustered Wound: No Photos Wound Measurements Length: (cm) 1 Width: (cm) 1 Depth: (cm) 0.2 Area: (cm) 0.785 Volume: (cm) 0.157 % Reduction in Area: 30.1% % Reduction in Volume: 30.2% Epithelialization: Small (1-33%) Tunneling: No Undermining: No Wound Description Classification: Category/Stage III Wound Margin: Distinct, outline attached Exudate Amount: Medium Exudate Type: Serosanguineous Exudate Color: red, brown Foul Odor After Cleansing: No Slough/Fibrino Yes Wound Bed Granulation Amount: Small (1-33%) Exposed Structure Granulation Quality: Red, Pink, Pale Fascia Exposed: No Necrotic Amount: Large (67-100%) Fat Layer (Subcutaneous Tissue) Exposed: Yes Tendon Exposed: No Muscle Exposed: No Joint Exposed: No Bone Exposed: No Treatment Notes Wound #7 (Malleolus) Wound Laterality: Right, Lateral Cleanser Soap and Water Discharge Instruction: Gently cleanse wound with antibacterial soap, rinse and pat dry prior to dressing wounds Peri-Wound Care Avallone, [name] ([id number]) Topical Primary Dressing Prisma 4.34 (in) Discharge Instruction: Moisten w/normal  saline or sterile water;  Cover wound as directed. Do not remove from wound bed. Curad Oil Emulsion Dressing 3x3 (in/in) Discharge Instruction: over prisma Secondary Dressing ABD Pad 5x9 (in/in) Discharge Instruction: Cover with ABD pad Secured With Compression Wrap Profore Lite LF 3 Multilayer Compression Bandaging System Discharge Instruction: Apply 3 multi-layer wrap as prescribed. Compression Stockings Add-Ons Electronic Signature(s) Signed: 08/05/2021 7:59:11 PM By: Carlene Coria RN Entered By: Carlene Coria on 08/04/2021 10:36:48 Karn, [name] ([id number]) -------------------------------------------------------------------------------- Vitals Details Patient Name: [name] Date of Service: 08/04/2021 10:15 AM Medical Record Number: [id number] Patient Account Number: [id number] Date of Birth/Sex: [birth year] (87 y.o. F) Treating RN: Carlene Coria Primary Care Marvelle Span: Adrian Prows Other Clinician: Referring Yaffa Seckman: Adrian Prows Treating Spencer Cardinal/Extender: Skipper Cliche in Treatment: 11 Vital Signs Time Taken: 10:21 Temperature (F): 98 Height (in): 62 Pulse (bpm): 85 Weight (lbs): 150 Respiratory Rate (breaths/min): 16 Body Mass Index (BMI): 27.4 Blood Pressure (mmHg): 143/67 Reference Range: 80 - 120 mg / dl Electronic Signature(s) Signed: 08/05/2021 7:59:11 PM By: Carlene Coria RN Entered By: Carlene Coria on 08/04/2021 10:21:25

## 2021-08-04 NOTE — Progress Notes (Addendum)
LASSIE, PETROVSKI ([id number]) Visit Report for 08/04/2021 Chief Complaint Document Details Patient Name: [name], [name]. Date of Service: 08/04/2021 10:15 AM Medical Record Number: [id number] Patient Account Number: [id number] Date of Birth/Sex: [birth year] (87 y.o. F) Treating RN: Carlene Coria Primary Care Provider: Adrian Prows Other Clinician: Referring Provider: Adrian Prows Treating Provider/Extender: Skipper Cliche in Treatment: 11 Information Obtained from: Patient Chief Complaint Right foot ulcer Electronic Signature(s) Signed: 08/04/2021 10:18:26 AM By: Worthy Keeler PA-C Entered By: Worthy Keeler on 08/04/2021 10:18:26 [name], [name] ([id number]) -------------------------------------------------------------------------------- Debridement Details Patient Name: [name] Date of Service: 08/04/2021 10:15 AM Medical Record Number: [id number] Patient Account Number: [id number] Date of Birth/Sex: [birth year] (87 y.o. F) Treating RN: Carlene Coria Primary Care Provider: Adrian Prows Other Clinician: Referring Provider: Adrian Prows Treating Provider/Extender: Skipper Cliche in Treatment: 11 Debridement Performed for Wound #7 Right,Lateral Malleolus Assessment: Performed By: Physician Tommie Sams., PA-C Debridement Type: Debridement Level of Consciousness (Pre- Awake and Alert procedure): Pre-procedure Verification/Time Out Yes - 10:33 Taken: Start Time: 10:33 Pain Control: Lidocaine 4% Topical Solution Total Area Debrided (L x W): 1 (cm) x 1 (cm) = 1 (cm) Tissue and other material Viable, Non-Viable, Callus, Subcutaneous, Skin: Dermis , Skin: Epidermis debrided: Level: Skin/Subcutaneous Tissue Debridement Description: Excisional Instrument: Curette Specimen: Tissue Culture Number of Specimens Taken: 1 Bleeding: Minimum Hemostasis Achieved: Pressure End Time: 10:37 Procedural Pain: 0 Post Procedural Pain: 0 Response to  Treatment: Procedure was tolerated well Level of Consciousness (Post- Awake and Alert procedure): Post Debridement Measurements of Total Wound Length: (cm) 1 Stage: Category/Stage III Width: (cm) 1 Depth: (cm) 0.2 Volume: (cm) 0.157 Character of Wound/Ulcer Post Debridement: Improved Post Procedure Diagnosis Same as Pre-procedure Electronic Signature(s) Signed: 08/04/2021 5:15:45 PM By: Worthy Keeler PA-C Signed: 08/05/2021 7:59:11 PM By: Carlene Coria RN Entered By: Carlene Coria on 08/04/2021 10:41:53 [name], [name] ([id number]) -------------------------------------------------------------------------------- HPI Details Patient Name: [name] Date of Service: 08/04/2021 10:15 AM Medical Record Number: [id number] Patient Account Number: [id number] Date of Birth/Sex: February 07, 1923 (87 y.o. 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 Dr. Amalia Hailey the podiatrist saw her on 07/20/2017 and sent her to the wound center for possible hyperbaric oxygen therapy. past medical history of peripheral vascular disease, varicose veins, status post appendectomy, basal cell carcinoma excision from the left leg, cholecystectomy, pacemaker placement, right lower extremity angiography done by Dr. dew in March 2017 with placement of a stent. there is also note of a successful ablation of the right small saphenous vein done which was reviewed by ultrasound on 10/24/2016. the patient had a right small saphenous vein ablation done on 10/20/2016. The patient has never been a smoker. She has been seen by Dr. Corene Cornea dew the  vascular surgeon who most recently saw her on 06/15/2017 for evaluation of ongoing problems with right leg swelling. She had a lower extremity arterial duplex examination done(02/13/17) which showed patent distal right superficial femoral artery stent and above-the-knee popliteal stent without evidence of restenosis. The ABI was more than 1.3 on the right and more than 1.3 on the left. This was consistent with noncompressible arteries due to medial calcification. The right great toe pressure and PPG waveforms  are within normal limits and the left great toe pressure and PPG waveforms are decreased. he recommended she continue to wear her compression stockings and continue with elevation. She is scheduled to have a noninvasive arterial study in the near future 08/16/2017 -- had a lower extremity arterial duplex examination done which showed patent distal right superficial femoral artery stent and above-the-knee popliteal stent without evidence of restenosis. The ABI was more than 1.3 on the right and more than 1.3 on the left. This was consistent with noncompressible arteries due to medial calcification. The right great toe pressure and PPG waveforms are within normal limits and the left great toe pressure and PPG waveforms are decreased. the x-ray of the right ankle has not yet been done 08/24/2017 -- had a right ankle x-ray -- IMPRESSION:1. No fracture, bone lesion or evidence of osteomyelitis. 2. Lateral soft tissue swelling with a soft tissue ulcer. she has not yet seen the vascular surgeon for review 08/31/17 on evaluation today patient's wound appears to be showing signs of improvement. She still with her appointment with vascular in order to review her results of her vascular study and then determine if any intervention would be recommended at that time. No fevers, chills, nausea, or vomiting noted at this time. She has been tolerating the dressing changes without complication. 09/28/17 on  evaluation today patient's wound appears to show signs of good improvement in regard to the granulation tissue which is surfacing. There is still a layer of slough covering the wound and the posterior portion is still significantly deeper than the anterior nonetheless there has been some good sign of things moving towards the better. She is going to go back to Dr. dew for reevaluation to ensure her blood flow is still appropriate. That will be before her next evaluation with [place] next week. No fevers, chills, nausea, or vomiting noted at this time. Patient does have some discomfort rated to be a 3-4/10 depending on activity specifically cleansing the wound makes it worse. 10/05/2017 -- the patient was seen by Dr. Lucky Cowboy last week and noninvasive studies showed a normal right ABI with brisk triphasic waveforms consistent with no arterial insufficiency including normal digital pressures. The duplex showed a patent distal right SFA stent and the proximal SFA was also normal. He was pleased with her test and thought she should have enough of perfusion for normal wound healing. He would see her back in 6 months time. 12/21/17 on evaluation today patient appears to be doing fairly well in regard to her right lateral ankle wound. Unfortunately the main issue that she is expansion at this point is that she is having some issues with what appears to be some cellulitis in the right anterior shin. She has also been noting a little bit of uncomfortable feeling especially last night and her ankle area. I'm afraid that she made the developing a little bit of an infection. With that being said I think it is in the early stages. 12/28/17 on evaluation today patient's ankle appears to be doing excellent. She's making good progress at this point the cellulitis seems to have improved after last week's evaluation. Overall she is having no significant discomfort which is excellent news. She does have an appointment with Dr. dew  on March 29, 2018 for reevaluation in regard to the stent he placed. She seems to have excellent blood flow in the right lower extremity. 01/19/12 on evaluation today patient's wound appears to be doing very well. In fact she does not  appear to require debridement at this point, there's no evidence of infection, and overall from the standpoint of the wound she seems to be doing very well. With that being said I believe that it may be time to switch to different dressing away from the [facility] Dressing she tells me she does have a lot going on her friend actually passed away yesterday and she's also having a lot of issues with her husband this obviously is weighing heavy on her as far as your thoughts and concerns today. 01/25/18 on evaluation today patient appears to be doing fairly well in regard to her right lateral malleolus. She has been tolerating the dressing changes without complication. Overall I feel like this is definitely showing signs of improvement as far as how the overall appearance of the wound is there's also evidence of epithelium start to migrate over the granulation tissue. In general I think that she is progressing nicely as far as the wound is concerned. The only concern she really has is whether or not we can switch to every other week visits in order to avoid having as many appointments as her daughters have a difficult time getting her to her appointments as well as the patient's husband to his he is not doing very well at this point. 02/22/18 on evaluation today patient's right lateral malleolus ulcer appears to be doing great. She has been tolerating the dressing changes without complication. Overall you making excellent progress at this time. Patient is having no significant discomfort. [name], [name] ([id number]) 03/15/18 on evaluation today patient appears to be doing much more poorly in regard to her right lateral ankle ulcer at this point. Unfortunately since  have last seen her her husband has passed just a few days ago is obviously weighed heavily on her her daughter also had surgery well she is with her today as usual. There does not appear to be any evidence of infection she does seem to have significant contusion/deep tissue injury to the right lateral malleolus which was not noted previous when I saw her last. It's hard to tell of exactly when this injury occurred although during the time she was spending the night in the hospital this may have been most likely. 03/22/18 on evaluation today patient appears to actually be doing very well in regard to her ulcer. She did unfortunately have a setback which was noted last week however the good news is we seem to be getting back on track and in fact the wound in the core did still have some necrotic tissue which will be addressed at this point today but in general I'm seeing signs that things are on the up and up. She is glad to hear this obviously she's been somewhat concerned that due to the how her wound digressed more recently. 03/29/18 on evaluation today patient appears to be doing fairly well in regard to her right lower extremity lateral malleolus ulcer. She unfortunately does have a new area of pressure injury over the inferior portion where the wound has opened up a little bit larger secondary to the pressure she seems to be getting. She does tell me sometimes when she sleeps at night that it actually hurts and does seem to be pushing on the area little bit more unfortunately. There does not appear to be any evidence of infection which is good news. She has been tolerating the dressing changes without complication. She also did have some bruising in the left second and third toes due  to the fact that she may have bump this or injured it although she has neuropathy so she does not feel she did move recently that may have been where this came from. Nonetheless there does not appear to be any evidence  of infection at this time. 04/12/18 on evaluation today patient's wound on the right lateral ankle actually appears to be doing a little bit better with a lot of necrotic docking tissue centrally loosening up in clearing away. However she does have the beginnings of a deep tissue injury on the left lateral malleolus likely due to the fact we've been trying offload the right as much as we have. I think she may benefit from an assistive soft device to help with offloading and it looks like they're looking at one of the doughnut conditions that wraps around the lower leg to offload which I think will definitely do a good job. With that being said I think we definitely need to address this issue on the left before it becomes a wound. Patient is not having significant pain. 04/19/18 on evaluation today patient appears to be doing excellent in regard to the progress she's made with her right lateral ankle ulcer. The left ankle region which did show evidence of a deep tissue injury seems to be resolving there's little fluid noted underneath and a blister there's nothing open at this point in time overall I feel like this is progressing nicely which is good news. She does not seem to be having significant discomfort at this point which is also good news. 04/25/18-She is here in follow up evaluation for bilateral lateral malleolar ulcers. The right lateral malleolus ulcer with pale subcutaneous tissue exposure, central area of ulcer with tendon/periosteum exposed. The left lateral malleolus ulcer now with central area of nonviable tissue, otherwise deep tissue injury. She is wearing compression wraps to the left lower extremity, she will place the right lower extremity compression wraps on when she gets home. She will be out of town over the weekend and return next week and follow-up appointment. She completed her doxycycline this morning 05/03/18 on evaluation today patient appears to be doing very well in  regard to her right lateral ankle ulcer in general. At least she's showing some signs of improvement in this regard. Unfortunately she has some additional injury to the left lateral malleolus region which appears to be new likely even over the past several days. Again this determination is based on the overall appearance. With that being said the patient is obviously frustrated about this currently. 05/10/18-She is here in follow-up evaluation for bilateral lateral malleolar ulcers. She states she has purchased offloading shoes/boots and they will arrive tomorrow. She was asked to bring them in the office at next week's appointment so her provider is aware of product being utilized. She continues to sleep on right or left side, she has been encouraged to sleep on her back. The right lateral malleolus ulcer is precariously close to peri-osteum; will order xray. The left lateral malleolus ulcer is improved. Will switch back to santyl; she will follow up next week. 05/17/18 on evaluation today patient actually appears to be doing very well in regard to her malleolus her ulcers compared to last time I saw them. She does not seem to have as much in the way of contusion at this point which is great news. With that being said she does continue to have discomfort and I do believe that she is still continuing to benefit from the  offloading/pressure reducing boots that were recommended. I think this is the key to trying to get this to heal up completely. 05/24/18 on evaluation today patient actually appears to be doing worse at this point in time unfortunately compared to her last week's evaluation. She is having really no increased pain which is good news unfortunately she does have more maceration in your theme and noted surrounding the right lateral ankle the left lateral ankle is not really is erythematous I do not see signs of the overt cellulitis on that side. Unfortunately the wounds do not seem to have  shown any signs of improvement since the last evaluation. She also has significant swelling especially on the right compared to previous some of this may be due to infection however also think that she may be served better while she has these wounds by compression wrapping versus continuing to use the Juxta-Lite for the time being. Especially with the amount of drainage that she is experiencing at this point. No fevers, chills, nausea, or vomiting noted at this time. 05/31/18 on evaluation today patient appears to actually be doing better in regard to her right lateral lower extremity ulcer specifically on the malleolus region. She has been tolerating the antibiotic without complication. With that being said she still continues to have issues but a little bit of redness although nothing like she what she was experiencing previous. She still continues to pressure to her ankle area she did get the problem on offloading boots unfortunately she will not wear them she states there too uncomfortable and she can't get in and out of the bed. Nonetheless at this point her wounds seem to be continually getting worse which is not what we want I'm getting somewhat concerned about her progress and how things are going to proceed if we do not intervene in some way shape or form. I therefore had a very lengthy conversation today about offloading yet again and even made a specific suggestion for switching her to a memory foam mattress and even gave the information for a specific one that they could look at getting if it was something that they were interested in considering. She does not want to be considered for a hospital bed air mattress although honestly insurance would not cover it that she does not have any wounds on her trunk. 06/14/18 on evaluation today both wounds over the bilateral lateral malleolus her ulcers appear to be doing better there's no evidence of pressure injury at this point. She did get the foam  mattress for her bed and this does seem to have been extremely beneficial for her in my pinion. Her daughter states that she is having difficulty getting out of bed because of how soft it is. The patient also relates this to be. Nonetheless I do feel like she's actually doing better. Unfortunately right after and around the time she was getting the mattress she also sustained a fall when she got up to go pick up the phone and ended up injuring her right elbow she has [address] sut[address] place. We are not caring for this currently although home health is going to be taking the sutures out shortly. Nonetheless this may be something that we need to evaluate going forward. It depends on how well it has or has not healed in the end. She also recently saw an orthopedic specialist for an injection in the right shoulder just before her fall unfortunately the fall seems to have worsened her pain. 06/21/18 on evaluation  today patient appears to be doing about the same in regard to her lateral malleolus ulcers. Both appear to be just a little bit deeper but again we are clinging away the necrotic and dead tissue which I think is why this is progressing towards a deeper realm as opposed [name], [name]. ([id number]) to improving from my measurement standpoint in that regard. Nonetheless she has been tolerating the dressing changes she absolutely hates the memory foam mattress topper that was obtained for her nonetheless I do believe this is still doing excellent as far as taking care of excess pressure in regard to the lateral malleolus regions. She in fact has no pressure injury that I see whereas in weeks past it was week by week I was constantly seeing new pressure injuries. Overall I think it has been very beneficial for her. 07/03/18; patient arrives in my clinic today. She has deep punched out areas over her bilateral lateral malleoli. The area on the right has some more depth. We spent a lot of time today  talking about pressure relief for these areas. This started when her daughter asked for a prescription for a memory foam mattress. I have never written a prescription for a mattress and I don't think insurances would pay for that on an ordinary bed. In any case he came up that she has foam boots that she refuses to wear. I would suggest going to these before any other offloading issues when she is in bed. They say she is meticulous about offloading this the rest of the day 07/10/18- She is seen in follow-up evaluation for bilateral, lateral malleolus ulcers. There is no improvement in the ulcers. She has purchased and is sleeping on a memory foam mattress/overlay, she has been using the offloading boots nightly over the past week. She has a follow up appointment with vascular medicine at the end of October, in my opinion this follow up should be expedited given her deterioration and suboptimal TBI results. We will order plain film xray of the left ankle as deeper structures are palpable; would consider having MRI, regardless of xray report(s). The ulcers will be treated with iodoflex/iodosorb, she is unable to safely change the dressings daily with santyl. 07/19/18 on evaluation today patient appears to be doing in general visually well in regard to her bilateral lateral malleolus ulcers. She has been tolerating the dressing changes without complication which is good news. With that being said we did have an x-ray performed on 07/12/18 which revealed a slight loosen see in the lateral portion of the distal left fibula which may represent artifact but underline lytic destruction or osteomyelitis could not be excluded. MRI was recommended. With that being said we can see about getting the patient scheduled for an MRI to further evaluate this area. In fact we have that scheduled currently for August 20 19,019. 07/26/18 on evaluation today patient's wound on the right lateral ankle actually appears to be doing  fairly well at this point in my pinion. She has made some good progress currently. With that being said unfortunately in regard to the left lateral ankle ulcer this seems to be a little bit more problematic at this time. In fact as I further evaluated the situation she actually had bone exposed which is the first time that's been the case in the bone appear to be necrotic. Currently I did review patient's note from Dr. Bunnie Domino office with [facility]. He stated that ABI was 1.26 on the right  and 0.95 on the left with good waveforms. Her perfusion is stable not reduced from previous studies and her digital waveforms were pretty good particularly on the right. His conclusion upon review of the note was that there was not much she could do to improve her perfusion and he felt she was adequate for wound healing. His suggestion was that she continued to see [place] and consider a synthetic skin graft if there was no underlying infection. He plans to see her back in six months or as needed. 08/01/18 on evaluation today patient appears to be doing better in regard to her right lateral ankle ulcer. Her left lateral ankle ulcer is about the same she still has bone involvement in evidence of necrosis. There does not appear to be evidence of infection at this time On the right lateral lower extremity. I have started her on the Augmentin she picked this up and started this yesterday. This is to get her through until she sees infectious disease which is scheduled for 08/12/18. 08/06/18 on evaluation today patient appears to be doing rather well considering my discussion with patient's daughter at the end of last week. The area which was marked where she had erythema seems to be improved and this is good news. With that being said overall the patient seems to be making good improvement when it comes to the overall appearance of the right lateral ankle ulcer although this has been slow she at least  is coming around in this regard. Unfortunately in regard to the left lateral ankle ulcer this is osteomyelitis based on the pathology report as well is bone culture. Nonetheless we are still waiting CT scan. Unfortunately the MRI we originally ordered cannot be performed as the patient is a pacemaker which I had overlooked. Nonetheless we are working on the CT scan approval and scheduling as of now. She did go to the hospital over the weekend and was placed on IV Cefzo for a couple of days. Fortunately this seems to have improved the erythema quite significantly which is good news. There does not appear to be any evidence of worsening infection at this time. She did have some bleeding after the last debridement therefore I did not perform any sharp debridement in regard to left lateral ankle at this point. Patient has been approved for a snap vac for the right lateral ankle. 08/14/18; the patient with wounds over her bilateral lateral malleoli. The area on the right actually looks quite good. Been using a snap back on this area. Healthy granulation and appears to be filling in. Unfortunately the area on the left is really problematic. 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 Also noted to have cellulitis. She saw Dr. Novella Olive of infectious disease today and was put on linezolid. We are able to verify this with her pharmacy. She is completed the Augmentin that she was already on. We've been using Iodoflex to this area 08/23/18 on evaluation today patient's wounds both actually appear to be doing better compared to my prior evaluations. Fortunately she showing signs of good improvement in regard to the overall wound status especially where were using the snap vac on the right. In regard to left lateral malleolus the wound bed actually appears to be much cleaner than previously noted. I do not feel any phone directly probed  during evaluation today and though there is tendon noted this does not appear to be necrotic  it's actually fairly good as far as the overall appearance of the tendon is concerned. In general the wound bed actually appears to be doing significantly better than it was previous. Patient is currently in the care of [name] and I did review that note today. He actually has her on two weeks of linezolid and then following the patient will be on 1-2 months of Keflex. That is the plan currently. She has been on antibiotics therapy as prescribed by myself initially starting on July 30, 2018 and has been on that continuously up to this point. 08/30/18 on evaluation today patient actually appears to be doing much better in regard to her right lateral malleolus ulcer. She has been tolerating the dressing changes specifically the snap vac without complication although she did have some issues with the seal currently. Apparently there was some trouble with getting it to maintain over the past week past Sunday. Nonetheless overall the wound appears better in regard to the right lateral malleolus region. In regard to left lateral malleolus this actually show some signs of additional granulation although there still tendon noted in the base of the wound this appears to be healthy not necrotic in any way whatsoever. We are considering potentially using a snap vac for the left lateral malleolus as well the product wrap from [employer], [place], was present in the clinic today we're going to see this patient I did have her come in with me after obtaining consent from the patient and her daughter in order to look at the wound and see if there's any recommendation one way or another as to whether or not they felt the snapback could be beneficial for the left lateral malleolus region. But the conclusion was that it might be but that this is definitely a little bit deeper wound than what traditionally would be utilized for a snap  vac. 09/06/18 on evaluation today patient actually appears to be doing excellent in my pinion in regard to both ankle ulcers. She has been tolerating the dressing changes without complication which is great news. Specifically we have been using the snap vac. In regard to the right ankle I'm not even sure that this is going to be necessary for today and following as the wound has filled in quite nicely. In regard to the left ankle I do believe [name], [name] ([id number]) that we're seeing excellent epithelialization from the edge as well as granulation in the central portion the tendon is still exposed but there's no evidence of necrotic bone and in general I feel like the patient has made excellent progress even compared to last week with just one week of the snap vac. 09/11/18; this is a patient who has wounds on her bilateral lateral malleoli. Initially both of these were deep stage IV wounds in the setting of chronic arterial insufficiency. She has been revascularized. As I understand think she been using snap vacs to both of these wounds however the area on the right became more superficial and currently she is only using it on the left. Using silver collagen on the right and silver collagen under the back on the left I believe 09/19/18 on evaluation today patient actually appears to be doing very well in regard to her lateral malleolus or ulcers bilaterally. She has been tolerating the dressing changes without complication. Fortunately there does not appear to be any evidence of infection at this time. Overall I feel like she is improving in an excellent manner and I'm very pleased  with the fact that everything seems to be turning towards the better for her. This has obviously been a long road. 09/27/18 on evaluation today patient actually appears to be doing very well in regard to her bilateral lateral malleolus ulcers. She has been tolerating the dressing changes without complication.  Fortunately there does not appear to be any evidence of infection at this time which is also great news. No fevers, chills, nausea, or vomiting noted at this time. Overall I feel like she is doing excellent with the snap vac on the left malleolus. She had 40 mL of fluid collection over the past week. 10/04/18 on evaluation today patient actually appears to be doing well in regard to her bilateral lateral malleolus ulcers. She continues to tolerate the dressing changes without complication. One issue that I see is the snap vac on the left lateral malleolus which appears to have sealed off some fluid underlying this area and has not really allowed it to heal to the degree that I would like to see. For that reason I did suggest at this point we may want to pack a small piece of packing strip into this region to allow it to more effectively wick out fluid. 10/11/18 in general the patient today does not feel that she has been doing very well. She's been a little bit lethargic and subsequently is having bodyaches as well according to what she tells me today. With that being said overall she has been concerned with the fact that something may be worsening although to be honest her wounds really have not been appearing poorly. She does have a new ulcer on her left heel unfortunately. This may be pressure related. Nonetheless it seems to me to have potentially started at least as a blister I do not see any evidence of deep tissue injury. In regard to the left ankle the snap vac still seems to be causing the ceiling off of the deeper part of the wound which is in turn trapping fluid. I'm not extremely pleased with the overall appearance as far as progress from last week to this week therefore I'm gonna discontinue the snap vac at this point. 10/18/18 patient unfortunately this point has not been feeling well for the past several days. She was seen by Grayland Ormond her primary care provider who is a Psychologist, occupational at [facility]. Subsequently she states that she's been very weak and generally feeling malaise. No fevers, chills, nausea, or vomiting noted at this time. With that being said bloodwork was performed at the PCP office on the 11th of this month which showed a white blood cell count of 10.7. This was repeated today and shows a white blood cell count of 12.4. This does show signs of worsening. Coupled with the fact that she is feeling worse and that her left ankle wound is not really showing signs of improvement I feel like this is an indication that the osteomyelitis is likely exacerbating not improving. Overall I think we may also want to check her C-reactive protein and sedimentation rate. Actually did call [facility] office this afternoon while the patient was in the office here with me. Subsequently based on the findings we discussed treatment possibilities and I think that it is appropriate for [place] to go ahead and initiate treatment with doxycycline which I'm going to do. Subsequently he did agree to see about adding a CRP and sedimentation rate to her orders. If that has not already been drawn to where they  can run it they will contact the patient she can come back to have that check. They are in agreement with plan as far as the patient and her daughter are concerned. Nonetheless also think we need to get in touch with Dr. Henreitta Leber office to see about getting the patient scheduled with him as soon as possible. 11/08/18 on evaluation today patient presents for follow-up concerning her bilateral foot and ankle ulcers. I did do an extensive review of her chart in epic today. Subsequently she was seen by [name] he did initiate Cefepime IV antibiotic therapy. Subsequently she had some issues with her PICC line this had to be removed because it was coiled and then replaced. Fortunately that was now settled. Unfortunately she has continued have issues with her left heel as well as  the issues that she is experiencing with her bilateral lateral malleolus regions. I do believe however both areas seem to be doing a little bit better on evaluation today which is good news. No fevers, chills, nausea, or vomiting noted at this time. She actually has an angiogram schedule with Dr. dew on this coming Monday, November 11, 2018. Subsequently the patient states that she is feeling much better especially than what she was roughly 2 weeks ago. She actually had to cancel an appointment because she was feeling so poorly. No fevers, chills, nausea, or vomiting noted at this time. 11/15/18 on evaluation today patient actually is status post having had her angiogram with Dr. dew Monday, four days ago. 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 He had to go and work on several areas of the vasculature fortunately he was able to obtain no more than a 30% residual stenosis throughout post procedure. I reviewed this note today. I think this will definitely help with healing at this time. Fortunately there does not appear to be any signs of infection and I do feel like ratio already has a better appearance to it. 11/22/18 upon evaluation today patient actually appears to be doing very well in regard to her wounds in general. The right lateral malleolus looks excellent the heel looks better in the left lateral malleolus also appears to be doing a little better. With that being said the right second toe actually appears to be open and training we been watching this is been dry and stable but now is open. 12/03/2018 Seen today for follow-up and management of multiple bilateral lower extremity wounds. New pressure injury of the great toe which is closed at this time. Wound of the right distal second toe appears larger today with deep undermining and a pocket of fluid present within the undermining region. Left and right malleolus is wounds are stable today with no signs and  symptoms of infection.Denies any needs or concerns during exam today. 12/13/18 on evaluation today patient appears to be doing somewhat better in regard to her left heel ulcer. She also seems to be completely healed in regard to the right lateral malleolus ulcer. The left malleolus ulcer is smaller what unfortunately the wounds which are new over the first and second toes of the right foot are what are most concerning at this point especially the second. Both areas did require sharp debridement today. 12/20/18 on evaluation today patient's wound actually appears to be doing better in regard to left lateral ankle and her right lateral ankle continues to remain healed. The hill ulcer on the left is improved. She does have improvement noted as  well in regard to both toe ulcers. Overall I'm very pleased in this regard. No fevers, chills, nausea, or vomiting noted at this time. 12/23/18 on evaluation today patient is seen after she had her toenails trimmed at the podiatrist office due to issues with her right great toe. There was what appeared to be dark eschar on the surface of the wound which had her in the podiatrist concerned. Nonetheless as I remember that during the last office visit I had utilize silver nitrate of this area I was much less concerned about the situation. Subsequently I was able to clean off much of this tissue without any complication today. This does not appear to show any signs of infection and actually look somewhat better Delaine, [name] ([id number]) compared to last time post debridement. Her second toe on the right foot actually had callous over and there did appear still be some fluid underneath this that would require debridement today. 12/27/18 on evaluation today patient actually appears to be showing signs of improvement at [name] locations. Even the left lateral ankle although this is not quite as great as the other sites. Fortunately there does not appear to be any signs of  infection at this time and both of her toes on the right foot seem to be showing signs of improvement which is good news and very pleased in this regard. 01/03/19 on evaluation today patient appears to be doing better for the most part in regard to her wounds in particular. There does not appear to be any evidence of infection at this time which is good news. Fortunately there is no sign of really worsening anywhere except for the right great toe which she does have what appears to be a bruise/deep tissue injury which is very superficial and already resolving. I'm not sure where this came from I questioned her extensively and she does not recall what may have happened with this. Other than that the patient seems to be doing well even the left lateral ankle ulcer looks good and is getting smaller. 01/10/19 on evaluation today patient appears to be doing well in regard to her left heel wound and both of her toe wounds. Overall I feel like there is definitely improvement here and I'm happy in that regard. With that being said unfortunately she is having issues with the left lateral malleolus ulcer which unfortunately still has a lot of depth to it. This is gonna be a very difficult wound for [place] to be able to truly get to heal. I may want to consider some type of skin substitute to see if this would be of benefit for her. I'll discuss this with her more the next visit most likely. This was something I thought about more at the end of the visit when I was Artie out of the room and the patient had been discharged. 01/17/19 on evaluation today patient appears to be doing very well in regard to her wounds in general. She's been making excellent progress at this time. Fortunately there's no sign of infection at this time either. No fevers, chills, nausea, or vomiting noted at this time. The biggest issue is still her left lateral malleolus where it appears to be doing well and is getting smaller but still shows a  small corner where this is deeper and goes down into what appears to be the joint space. Nonetheless this is taking much longer to heal although it still looks better in smaller than previous evaluations. 01/24/19 on evaluation today  patient's wounds actually appear to be doing rather well in general overall. She did require some sharp debridement in regard to the right great toe but everything else appears to be doing excellent no debridement was even necessary. No fevers, chills, nausea, or vomiting noted at this time. 01/31/19 on evaluation today patient actually appears to be doing much better in regard to her left foot wound on the heel as well as the ankle. The right great toe appears to be a little bit worse today this had callous over and trapped a lot of fluid underneath. Fortunately there's no signs of infection at any site which is great news. 02/07/19 on evaluation today patient actually appears to be doing decently well in regard to [name] of her ulcers at this point. No sharp debridement was required she is a little bit of hyper granulation in regard to the left lateral ankle as well as the left heel but the hill itself is almost completely healed which is excellent news. Overall been very pleased in this regard. 02/14/19 on evaluation today patient actually appears to be doing very well in regard to her ulcers on the right first toe, left lateral malleolus, and left heel. In fact the heel is almost completely healed at this point. The patient does not show any signs of infection which is good news. Overall very pleased with how things have progressed. 04/18/19 Telehealth Evaluation During the COVID-19 National Emergency: Verbal Consent: Obtained from patient Allergies: reviewed and the active list is current. Medication changes: patient has no current medication changes. COVID-19 Screening: 1. Have you traveled internationally or on a cruise ship in the last 14 dayso No 2. Have you had  contact with someone with or under investigation for COVID-19o No 3. Have you had a fever, cough, sore throat, or experiencing shortness of breatho No on evaluation today actually did have a visit with this patient through a telehealth encounter with her home health nurse. Subsequently it was noted that the patient actually appears to be doing okay in regard to her wounds both the right great toe as well as the left lateral malleolus have shown signs of improvement although this in your theme around the left lateral malleolus there eschar coverings for both locations. The question is whether or not they are actually close and whether or not home health needs to discharge the patient or not. Nonetheless my concern is this point obviously is that without actually seeing her and being able to evaluate this directly I cannot ensure that she is completely healed which is the question that I'm being asked. 04/22/19 on evaluation today patient presents for her first evaluation since last time I saw her which was actually February 14, 2019. I did do a telehealth visit last week in which point it was questionable whether or not she may be healed and had to bring her in today for confirmation. With that being said she does seem to be doing quite well at this point which is good news. There does not appear to be any drainage in the deed I believe her wounds may be healed. Readmission: 09/04/2019 on evaluation today patient appears to be doing unfortunately somewhat more poorly in regard to her left foot ulcer secondary to a wound that began on 08/21/2019 at least when she first noticed this. Fortunately she has not had any evidence of active infection at this time. Systemically. I also do not necessarily see any evidence of infection at the blister/wound site on the  first metatarsal head plantar aspect. This almost appears to be something that may have just rubbed inappropriately causing this to breakdown. They did  not want a wait too long to come in to be seen as again she had significant issues in the past with wounds that took quite a while to heal in fact it was close to 2 years. Nonetheless this does not appear to be quite that bad but again we do need to remove some of the necrotic tissue from the surface of the wound to tell exactly the extent. She does not appear to have any significant arterial disease at this point and again her last ABIs and TBI's are recorded above in the alert section her left ABI was 1.27 with a TBI of 0.72 to the right ABI 1.08 with a TBI of 0.39. Other than this the patient has been doing quite well since I last saw her and that was in May 2020. 09/11/2019 on evaluation today patient appeared to be doing very well with regard to her plantar foot ulcer on the left. In fact this appears to be almost completely healed which is awesome. That is after just 1 week of intervention. With that being said there is no signs of active infection at this time. 09/18/2019 on evaluation today patient actually appears to be doing excellent in fact she is completely healed based on what I am seeing at this SATIVA, DRILLING. ([id number]) point. Fortunately there is no signs of active infection at this time and overall patient is very pleased to hear that this area has healed so quickly. Readmission: 05/13/2021 upon evaluation today this patient presents for reevaluation here in the clinic. This is a wound that actually we previously took care of. She had 1 on the right ankle and the left the left turned out to be be harder due to to heal but nonetheless is doing great at this point as the right that has reopened and it was noted first just several weeks ago with a scab over it and came off in just the past few days. Fortunately there does not appear to be any obvious evidence of significant active infection at this time which is great news. No fevers, chills, nausea, vomiting, or diarrhea. The  patient does have a history of pacemaker along with being on Eliquis currently as well. There does not appear to be any signs of this interfering in any way with her wound. She does have swelling we previously had compression socks for her ordered but again it does not look like she wears these on a regular basis by any means. 05/26/2021 upon evaluation today patient appears to be doing well with regard to her wound which is actually showing signs of excellent improvement. There does not appear to be any signs of active infection which is great news and overall very pleased with where things stand today. No fevers, chills, nausea, vomiting, or diarrhea. 06/02/2021 upon evaluation today patient's wound actually showing signs of excellent improvement. Fortunately there does not appear to be any signs of active infection which is great news. I think the patient is making good progress with regard to her wounds in general. 06/09/2021 upon evaluation today patient appears to be doing excellent in regard to her wounds currently. Fortunately there does not appear to be any signs of active infection which is great news. No fevers, chills, nausea, vomiting, or diarrhea. Overall extremely pleased with where things stand today. I think the patient is making excellent  progress. 06/16/2021 upon evaluation today patient appears to be doing well in regard to her wound. This is going require little bit of debridement today and that was discussed with the patient. Otherwise she seems to be doing quite well and I am actually very pleased with where things stand at this point. No fevers, chills, nausea, vomiting, or diarrhea. 06/23/2021 upon evaluation today patient appears to be doing well with regard to her wounds. She has been tolerating the dressing changes without complication. Fortunately there does not appear to be any evidence of infection and she has not had air in her home which she actually lives at an assisted  living that got fixed this morning. With that being said because of that her wrap has been extremely hot and bothersome for her over the past week. 06/30/2021 upon evaluation today patient is actually making excellent progress in regard to her ankle ulcer. She has been tolerating the dressing changes without complication and overall extremely pleased with where things stand there does not appear to be any evidence of active infection which is great news. No fevers, chills, nausea, vomiting, or diarrhea. 07/07/21 upon evaluation today patients and culture on the right actually appears to be doing quite well. There does not appear to be any signs of infection and overall very pleased with where things stand today. No fevers, chills, nausea, or vomiting noted at this time. 07/14/2021 unfortunately the patient today has some evidence of deep tissue injury and pressure getting to the ankle region. Again I am not exactly sure what is going on here but this is very similar to issues that we have had in the past. I explained to the patient that she needs to be very mindful of exactly what is happening I think sleeping in bed is probably the main issue here although there could be other culprits I am not sure what else would potentially lead to this kind of a problem for her. 07/21/2021 upon evaluation today patient's wound actually showing signs of improvement compared to last week. Fortunately there does not appear to be any signs of active infection which is great news and overall very pleased with where things stand in that regard. With that being said I do believe that she is continuing to show signs of overall of getting better although I think this is still basically about what we were 2 weeks ago due to the worsening and now improvement. 07/28/2021 upon evaluation today patient appears to be doing well with regard to her wound. She does have some slough buildup on the surface of the wound which I would have  to manage today. Fortunately there is no sign of active infection at this time. No fevers, chills, nausea, vomiting, or diarrhea. 08/04/2021 upon evaluation today patient appears to be doing about the same in regard to her wound. To be perfectly honest I am beginning to be a little bit concerned about the overall appearance of the wound bed. I do think possibly taking a sample right around the margin of the wound could be beneficial for her as far as identifying anything such as an inflammatory process or to be honest even a skin tag cancer type process that may be of concern here. Fortunately there does not appear to be any evidence of active infection at this time which is great news she is not having any pain also great news. Electronic Signature(s) Signed: 08/04/2021 10:48:43 AM By: Worthy Keeler PA-C Entered By: Worthy Keeler on 08/04/2021  10:48:43 [name], ALLI ([id number]) -------------------------------------------------------------------------------- Physical Exam Details Patient Name: SHURON, CASSITY Date of Service: 08/04/2021 10:15 AM Medical Record Number: [id number] Patient Account Number: [id number] Date of Birth/Sex: [birth year] (87 y.o. F) Treating RN: Carlene Coria Primary Care Provider: Adrian Prows Other Clinician: Referring Provider: Adrian Prows Treating Provider/Extender: Skipper Cliche in Treatment: 28 Constitutional Well-nourished and well-hydrated in no acute distress. Respiratory normal breathing without difficulty. Psychiatric this patient is able to make decisions and demonstrates good insight into disease process. Alert and Oriented x 3. pleasant and cooperative. Notes Upon inspection patient's wound bed actually showed signs of good granulation and epithelization at this point. Fortunately there does not appear to be any evidence of active infection systemically which is great news and overall I am extremely pleased with where things stand  today. No fevers, chills, nausea, vomiting, or diarrhea. Electronic Signature(s) Signed: 08/04/2021 10:48:59 AM By: Worthy Keeler PA-C Entered By: Worthy Keeler on 08/04/2021 10:48:59 [place], [name] ([id number]) -------------------------------------------------------------------------------- Physician Orders Details Patient Name: [name] Date of Service: 08/04/2021 10:15 AM Medical Record Number: [id number] Patient Account Number: [id number] Date of Birth/Sex: May 21, 1923 (87 y.o. F) Treating RN: Carlene Coria Primary Care Provider: Adrian Prows Other Clinician: Referring Provider: Adrian Prows Treating Provider/Extender: Skipper Cliche in Treatment: 11 Verbal / Phone Orders: No Diagnosis Coding ICD-10 Coding Code Description 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, stage 3 I89.0 Lymphedema, not elsewhere classified I87.2 Venous insufficiency (chronic) (peripheral) Z95.0 Presence of cardiac pacemaker Z79.01 Long term (current) use of anticoagulants Follow-up Appointments o Return Appointment in 1 week. Bathing/ Shower/ Hygiene o May shower with wound dressing protected with water repellent cover or cast protector. Edema Control - Lymphedema / Segmental Compressive Device / Other o Optional: One layer of unna paste to top of compression wrap (to act as an anchor). - and at toes o Elevate, Exercise Daily and Avoid Standing for Long Periods of Time. o Elevate legs to the level of the heart and pump ankles as often as possible o Elevate leg(s) parallel to the floor when sitting. Wound Treatment Wound #7 - Malleolus Wound Laterality: Right, Lateral Cleanser: Soap and Water 1 x Per Week/30 Days Discharge Instructions: Gently cleanse wound with antibacterial soap, rinse and pat dry prior to dressing wounds Primary Dressing: Prisma 4.34 (in) 1 x Per Week/30 Days Discharge Instructions: Moisten w/normal saline or sterile water; Cover wound as  directed. Do not remove from wound bed. Primary Dressing: Curad Oil Emulsion Dressing 3x3 (in/in) 1 x Per Week/30 Days Discharge Instructions: over prisma Secondary Dressing: ABD Pad 5x9 (in/in) 1 x Per Week/30 Days Discharge Instructions: Cover with ABD pad Compression Wrap: Profore Lite LF 3 Multilayer Compression Bandaging System 1 x Per Week/30 Days Discharge Instructions: Apply 3 multi-layer wrap as prescribed. Laboratory o Bacteria identified in Tissue by Biopsy culture (MICRO) - tissue biopsy right ankle - (ICD10 L89.513 - Pressure ulcer of right ankle, stage 3) oooo LOINC Code: [id number] oooo Convenience Name: Biopsy specimen culture Electronic Signature(s) Signed: 08/12/2021 4:41:08 PM By: Worthy Keeler PA-C Signed: 08/17/2021 7:54:52 AM By: Carlene Coria RN Previous Signature: 08/04/2021 5:15:45 PM Version By: Worthy Keeler PA-C Previous Signature: 08/05/2021 7:59:11 PM Version By: Carlene Coria RN Entered By: Carlene Coria on 08/12/2021 10:51:10 [place], [name] ([id number]) Grega, [name] ([id number]) -------------------------------------------------------------------------------- Problem List Details Patient Name: [name] Date of Service: 08/04/2021 10:15 AM Medical Record Number: [id number] Patient Account Number: [id number] Date of Birth/Sex: [birth year] (87 y.o. F) Treating  RN: Carlene Coria Primary Care Provider: Adrian Prows Other Clinician: Referring Provider: Adrian Prows Treating Provider/Extender: Skipper Cliche in Treatment: 11 Active Problems ICD-10 Encounter Code Description Active Date MDM Diagnosis 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, stage 3 05/13/2021 No Yes I89.0 Lymphedema, not elsewhere classified 05/13/2021 No Yes I87.2 Venous insufficiency (chronic) (peripheral) 05/13/2021 No Yes Z95.0 Presence of cardiac pacemaker 05/13/2021 No Yes Z79.01 Long term (current) use of anticoagulants 05/13/2021 No Yes Inactive Problems Resolved  Problems Electronic Signature(s) Signed: 08/04/2021 10:18:10 AM By: Worthy Keeler PA-C Entered By: Worthy Keeler on 08/04/2021 10:18:09 [place], [name] ([id number]) -------------------------------------------------------------------------------- Progress Note Details Patient Name: [name] Date of Service: 08/04/2021 10:15 AM Medical Record Number: [id number] Patient Account Number: [id number] Date of Birth/Sex: 1923-04-29 (87 y.o. 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 Dr. Amalia Hailey the podiatrist saw her on 07/20/2017 and sent her to the wound center for possible hyperbaric oxygen therapy. past medical history of peripheral vascular disease, varicose veins, status post appendectomy, basal cell carcinoma excision from the left leg, cholecystectomy, pacemaker placement, right lower extremity angiography done by Dr. dew in March 2017 with placement of a stent. there is also note of a successful ablation of the right small saphenous vein done which was reviewed by ultrasound on 10/24/2016. the patient had a right small saphenous vein ablation done on 10/20/2016. The patient has never been a smoker. She has been seen by Dr. Corene Cornea dew the vascular surgeon who most recently saw her on 06/15/2017 for evaluation of ongoing problems with right leg swelling. She had a lower extremity arterial duplex examination done(02/13/17) which showed patent distal right superficial femoral artery stent and above-the-knee popliteal stent without  evidence of restenosis. The ABI was more than 1.3 on the right and more than 1.3 on the left. This was consistent with noncompressible arteries due to medial calcification. The right great toe pressure and PPG waveforms are within normal limits and the left great toe pressure and PPG waveforms are decreased. he recommended she continue to wear her compression stockings and continue with elevation. She is scheduled to have a noninvasive arterial study in the near future 08/16/2017 -- had a lower extremity arterial duplex examination done which showed patent distal right superficial femoral artery stent and above-the-knee popliteal stent without evidence of restenosis. The ABI was more than 1.3 on the right and more than 1.3 on the left. This was consistent with noncompressible arteries due to medial calcification. The right great toe pressure and PPG waveforms are within normal limits and the left great toe pressure and PPG waveforms are decreased. the x-ray of the right ankle has not yet been done 08/24/2017 -- had a right ankle x-ray -- IMPRESSION:1. No fracture, bone lesion or evidence of osteomyelitis. 2. Lateral soft tissue swelling with a soft tissue ulcer. she has not yet seen the vascular surgeon for review 08/31/17 on evaluation today patient's wound appears to be showing signs of improvement. She still with her appointment with vascular in order to review her results of her vascular study and then determine if any intervention would be recommended at that time. No fevers, chills,  nausea, or vomiting noted at this time. She has been tolerating the dressing changes without complication. 09/28/17 on evaluation today patient's wound appears to show signs of good improvement in regard to the granulation tissue which is surfacing. There is still a layer of slough covering the wound and the posterior portion is still significantly deeper than the anterior nonetheless there has been some good sign  of things moving towards the better. She is going to go back to Dr. dew for reevaluation to ensure her blood flow is still appropriate. That will be before her next evaluation with [place] next week. No fevers, chills, nausea, or vomiting noted at this time. Patient does have some discomfort rated to be a 3-4/10 depending on activity specifically cleansing the wound makes it worse. 10/05/2017 -- the patient was seen by Dr. Lucky Cowboy last week and noninvasive studies showed a normal right ABI with brisk triphasic waveforms consistent with no arterial insufficiency including normal digital pressures. The duplex showed a patent distal right SFA stent and the proximal SFA was also normal. He was pleased with her test and thought she should have enough of perfusion for normal wound healing. He would see her back in 6 months time. 12/21/17 on evaluation today patient appears to be doing fairly well in regard to her right lateral ankle wound. Unfortunately the main issue that she is expansion at this point is that she is having some issues with what appears to be some cellulitis in the right anterior shin. She has also been noting a little bit of uncomfortable feeling especially last night and her ankle area. I'm afraid that she made the developing a little bit of an infection. With that being said I think it is in the early stages. 12/28/17 on evaluation today patient's ankle appears to be doing excellent. She's making good progress at this point the cellulitis seems to have improved after last week's evaluation. Overall she is having no significant discomfort which is excellent news. She does have an appointment with Dr. dew on March 29, 2018 for reevaluation in regard to the stent he placed. She seems to have excellent blood flow in the right lower extremity. 01/19/12 on evaluation today patient's wound appears to be doing very well. In fact she does not appear to require debridement at this point, there's no evidence  of infection, and overall from the standpoint of the wound she seems to be doing very well. With that being said I believe that it may be time to switch to different dressing away from the [facility] Dressing she tells me she does have a lot going on her friend actually passed away yesterday and she's also having a lot of issues with her husband this obviously is weighing heavy on her as far as your thoughts and concerns today. 01/25/18 on evaluation today patient appears to be doing fairly well in regard to her right lateral malleolus. She has been tolerating the dressing changes without complication. Overall I feel like this is definitely showing signs of improvement as far as how the overall appearance of the wound is there's also evidence of epithelium start to migrate over the granulation tissue. In general I think that she is progressing nicely as far as the wound is concerned. The only concern she really has is whether or not we can switch to every other week visits in order to avoid having as many [name], [name] ([id number]) appointments as her daughters have a difficult time getting her to her appointments  as well as the patient's husband to his he is not doing very well at this point. 02/22/18 on evaluation today patient's right lateral malleolus ulcer appears to be doing great. She has been tolerating the dressing changes without complication. Overall you making excellent progress at this time. Patient is having no significant discomfort. 03/15/18 on evaluation today patient appears to be doing much more poorly in regard to her right lateral ankle ulcer at this point. Unfortunately since have last seen her her husband has passed just a few days ago is obviously weighed heavily on her her daughter also had surgery well she is with her today as usual. There does not appear to be any evidence of infection she does seem to have significant contusion/deep tissue injury to the right lateral  malleolus which was not noted previous when I saw her last. It's hard to tell of exactly when this injury occurred although during the time she was spending the night in the hospital this may have been most likely. 03/22/18 on evaluation today patient appears to actually be doing very well in regard to her ulcer. She did unfortunately have a setback which was noted last week however the good news is we seem to be getting back on track and in fact the wound in the core did still have some necrotic tissue which will be addressed at this point today but in general I'm seeing signs that things are on the up and up. She is glad to hear this obviously she's been somewhat concerned that due to the how her wound digressed more recently. 03/29/18 on evaluation today patient appears to be doing fairly well in regard to her right lower extremity lateral malleolus ulcer. She unfortunately does have a new area of pressure injury over the inferior portion where the wound has opened up a little bit larger secondary to the pressure she seems to be getting. She does tell me sometimes when she sleeps at night that it actually hurts and does seem to be pushing on the area little bit more unfortunately. There does not appear to be any evidence of infection which is good news. She has been tolerating the dressing changes without complication. She also did have some bruising in the left second and third toes due to the fact that she may have bump this or injured it although she has neuropathy so she does not feel she did move recently that may have been where this came from. Nonetheless there does not appear to be any evidence of infection at this time. 04/12/18 on evaluation today patient's wound on the right lateral ankle actually appears to be doing a little bit better with a lot of necrotic docking tissue centrally loosening up in clearing away. However she does have the beginnings of a deep tissue injury on the left  lateral malleolus likely due to the fact we've been trying offload the right as much as we have. I think she may benefit from an assistive soft device to help with offloading and it looks like they're looking at one of the doughnut conditions that wraps around the lower leg to offload which I think will definitely do a good job. With that being said I think we definitely need to address this issue on the left before it becomes a wound. Patient is not having significant pain. 04/19/18 on evaluation today patient appears to be doing excellent in regard to the progress she's made with her right lateral ankle ulcer. The left ankle region  which did show evidence of a deep tissue injury seems to be resolving there's little fluid noted underneath and a blister there's nothing open at this point in time overall I feel like this is progressing nicely which is good news. She does not seem to be having significant discomfort at this point which is also good news. 04/25/18-She is here in follow up evaluation for bilateral lateral malleolar ulcers. The right lateral malleolus ulcer with pale subcutaneous tissue exposure, central area of ulcer with tendon/periosteum exposed. The left lateral malleolus ulcer now with central area of nonviable tissue, otherwise deep tissue injury. She is wearing compression wraps to the left lower extremity, she will place the right lower extremity compression wraps on when she gets home. She will be out of town over the weekend and return next week and follow-up appointment. She completed her doxycycline this morning 05/03/18 on evaluation today patient appears to be doing very well in regard to her right lateral ankle ulcer in general. At least she's showing some signs of improvement in this regard. Unfortunately she has some additional injury to the left lateral malleolus region which appears to be new likely even over the past several days. Again this determination is based on the  overall appearance. With that being said the patient is obviously frustrated about this currently. 05/10/18-She is here in follow-up evaluation for bilateral lateral malleolar ulcers. She states she has purchased offloading shoes/boots and they will arrive tomorrow. She was asked to bring them in the office at next week's appointment so her provider is aware of product being utilized. She continues to sleep on right or left side, she has been encouraged to sleep on her back. The right lateral malleolus ulcer is precariously close to peri-osteum; will order xray. The left lateral malleolus ulcer is improved. Will switch back to santyl; she will follow up next week. 05/17/18 on evaluation today patient actually appears to be doing very well in regard to her malleolus her ulcers compared to last time I saw them. She does not seem to have as much in the way of contusion at this point which is great news. With that being said she does continue to have discomfort and I do believe that she is still continuing to benefit from the offloading/pressure reducing boots that were recommended. I think this is the key to trying to get this to heal up completely. 05/24/18 on evaluation today patient actually appears to be doing worse at this point in time unfortunately compared to her last week's evaluation. She is having really no increased pain which is good news unfortunately she does have more maceration in your theme and noted surrounding the right lateral ankle the left lateral ankle is not really is erythematous I do not see signs of the overt cellulitis on that side. Unfortunately the wounds do not seem to have shown any signs of improvement since the last evaluation. She also has significant swelling especially on the right compared to previous some of this may be due to infection however also think that she may be served better while she has these wounds by compression wrapping versus continuing to use the  Juxta-Lite for the time being. Especially with the amount of drainage that she is experiencing at this point. No fevers, chills, nausea, or vomiting noted at this time. 05/31/18 on evaluation today patient appears to actually be doing better in regard to her right lateral lower extremity ulcer specifically on the malleolus region. She has been tolerating the  antibiotic without complication. With that being said she still continues to have issues but a little bit of redness although nothing like she what she was experiencing previous. She still continues to pressure to her ankle area she did get the problem on offloading boots unfortunately she will not wear them she states there too uncomfortable and she can't get in and out of the bed. Nonetheless at this point her wounds seem to be continually getting worse which is not what we want I'm getting somewhat concerned about her progress and how things are going to proceed if we do not intervene in some way shape or form. I therefore had a very lengthy conversation today about offloading yet again and even made a specific suggestion for switching her to a memory foam mattress and even gave the information for a specific one that they could look at getting if it was something that they were interested in considering. She does not want to be considered for a hospital bed air mattress although honestly insurance would not cover it that she does not have any wounds on her trunk. 06/14/18 on evaluation today both wounds over the bilateral lateral malleolus her ulcers appear to be doing better there's no evidence of pressure injury at this point. She did get the foam mattress for her bed and this does seem to have been extremely beneficial for her in my pinion. Her daughter states that she is having difficulty getting out of bed because of how soft it is. The patient also relates this to be. Nonetheless I do feel like she's actually doing better. Unfortunately  right after and around the time she was getting the mattress she also sustained a fall when she got up to go pick up the phone and ended up injuring her right elbow she has 18 sutures in place. We are not caring for this currently although home health is going to be taking the sutures out shortly. Nonetheless this may be something that we need to evaluate going forward. It depends on [name], [name] ([id number]) how well it has or has not healed in the end. She also recently saw an orthopedic specialist for an injection in the right shoulder just before her fall unfortunately the fall seems to have worsened her pain. 06/21/18 on evaluation today patient appears to be doing about the same in regard to her lateral malleolus ulcers. Both appear to be just a little bit deeper but again we are clinging away the necrotic and dead tissue which I think is why this is progressing towards a deeper realm as opposed to improving from my measurement standpoint in that regard. Nonetheless she has been tolerating the dressing changes she absolutely hates the memory foam mattress topper that was obtained for her nonetheless I do believe this is still doing excellent as far as taking care of excess pressure in regard to the lateral malleolus regions. She in fact has no pressure injury that I see whereas in weeks past it was week by week I was constantly seeing new pressure injuries. Overall I think it has been very beneficial for her. 07/03/18; patient arrives in my clinic today. She has deep punched out areas over her bilateral lateral malleoli. The area on the right has some more depth. We spent a lot of time today talking about pressure relief for these areas. This started when her daughter asked for a prescription for a memory foam mattress. I have never written a prescription for a mattress and I  don't think insurances would pay for that on an ordinary bed. In any case he came up that she has foam boots that  she refuses to wear. I would suggest going to these before any other offloading issues when she is in bed. They say she is meticulous about offloading this the rest of the day 07/10/18- She is seen in follow-up evaluation for bilateral, lateral malleolus ulcers. There is no improvement in the ulcers. She has purchased and is sleeping on a memory foam mattress/overlay, she has been using the offloading boots nightly over the past week. She has a follow up appointment with vascular medicine at the end of October, in my opinion this follow up should be expedited given her deterioration and suboptimal TBI results. We will order plain film xray of the left ankle as deeper structures are palpable; would consider having MRI, regardless of xray report(s). The ulcers will be treated with iodoflex/iodosorb, she is unable to safely change the dressings daily with santyl. 07/19/18 on evaluation today patient appears to be doing in general visually well in regard to her bilateral lateral malleolus ulcers. She has been tolerating the dressing changes without complication which is good news. With that being said we did have an x-ray performed on 07/12/18 which revealed a slight loosen see in the lateral portion of the distal left fibula which may represent artifact but underline lytic destruction or osteomyelitis could not be excluded. MRI was recommended. With that being said we can see about getting the patient scheduled for an MRI to further evaluate this area. In fact we have that scheduled currently for August 20 19,019. 07/26/18 on evaluation today patient's wound on the right lateral ankle actually appears to be doing fairly well at this point in my pinion. She has made some good progress currently. With that being said unfortunately in regard to the left lateral ankle ulcer this seems to be a little bit more problematic at this time. In fact as I further evaluated the situation she actually had bone exposed which  is the first time that's been the case in the bone appear to be necrotic. Currently I did review patient's note from Dr. Bunnie Domino office with [facility]. He stated that ABI was 1.26 on the right and 0.95 on the left with good waveforms. Her perfusion is stable not reduced from previous studies and her digital waveforms were pretty good particularly on the right. His conclusion upon review of the note was that there was not much she could do to improve her perfusion and he felt she was adequate for wound healing. His suggestion was that she continued to see [place] and consider a synthetic skin graft if there was no underlying infection. He plans to see her back in six months or as needed. 08/01/18 on evaluation today patient appears to be doing better in regard to her right lateral ankle ulcer. Her left lateral ankle ulcer is about the same she still has bone involvement in evidence of necrosis. There does not appear to be evidence of infection at this time On the right lateral lower extremity. I have started her on the Augmentin she picked this up and started this yesterday. This is to get her through until she sees infectious disease which is scheduled for 08/12/18. 08/06/18 on evaluation today patient appears to be doing rather well considering my discussion with patient's daughter at the end of last week. The area which was marked where she had erythema seems to be  improved and this is good news. With that being said overall the patient seems to be making good improvement when it comes to the overall appearance of the right lateral ankle ulcer although this has been slow she at least is coming around in this regard. Unfortunately in regard to the left lateral ankle ulcer this is osteomyelitis based on the pathology report as well is bone culture. Nonetheless we are still waiting CT scan. Unfortunately the MRI we originally ordered cannot be performed as the patient is a pacemaker which  I had overlooked. Nonetheless we are working on the CT scan approval and scheduling as of now. She did go to the hospital over the weekend and was placed on IV Cefzo for a couple of days. Fortunately this seems to have improved the erythema quite significantly which is good news. There does not appear to be any evidence of worsening infection at this time. She did have some bleeding after the last debridement therefore I did not perform any sharp debridement in regard to left lateral ankle at this point. Patient has been approved for a snap vac for the right lateral ankle. 08/14/18; the patient with wounds over her bilateral lateral malleoli. The area on the right actually looks quite good. Been using a snap back on this area. Healthy granulation and appears to be filling in. Unfortunately the area on the left is really problematic. 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 Also noted to have cellulitis. She saw Dr. Novella Olive of infectious disease today and was put on linezolid. We are able to verify this with her pharmacy. She is completed the Augmentin that she was already on. We've been using Iodoflex to this area 08/23/18 on evaluation today patient's wounds both actually appear to be doing better compared to my prior evaluations. Fortunately she showing signs of good improvement in regard to the overall wound status especially where were using the snap vac on the right. In regard to left lateral malleolus the wound bed actually appears to be much cleaner than previously noted. I do not feel any phone directly probed during evaluation today and though there is tendon noted this does not appear to be necrotic it's actually fairly good as far as the overall appearance of the tendon is concerned. In general the wound bed actually appears to be doing significantly better than it was previous. Patient is currently in the care  of [name] and I did review that note today. He actually has her on two weeks of linezolid and then following the patient will be on 1-2 months of Keflex. That is the plan currently. She has been on antibiotics therapy as prescribed by myself initially starting on July 30, 2018 and has been on that continuously up to this point. 08/30/18 on evaluation today patient actually appears to be doing much better in regard to her right lateral malleolus ulcer. She has been tolerating the dressing changes specifically the snap vac without complication although she did have some issues with the seal currently. Apparently there was some trouble with getting it to maintain over the past week past Sunday. Nonetheless overall the wound appears better in regard to the right lateral malleolus region. In regard to left lateral malleolus this actually show some signs of additional granulation although there still tendon noted in the base of the wound this appears to be healthy not necrotic in any way whatsoever. We are  considering potentially using a snap vac for the left lateral malleolus as well the product wrap from [employer], [place], was present in the clinic today we're going to see this patient I did have her come in with me after obtaining consent from the patient and her daughter in order to look at the wound and see if there's any recommendation one way or another as to whether or not they felt the snapback could be beneficial for the left lateral malleolus region. But [name], [name] ([id number]) the conclusion was that it might be but that this is definitely a little bit deeper wound than what traditionally would be utilized for a snap vac. 09/06/18 on evaluation today patient actually appears to be doing excellent in my pinion in regard to both ankle ulcers. She has been tolerating the dressing changes without complication which is great news. Specifically we have been using the snap vac. In regard to the  right ankle I'm not even sure that this is going to be necessary for today and following as the wound has filled in quite nicely. In regard to the left ankle I do believe that we're seeing excellent epithelialization from the edge as well as granulation in the central portion the tendon is still exposed but there's no evidence of necrotic bone and in general I feel like the patient has made excellent progress even compared to last week with just one week of the snap vac. 09/11/18; this is a patient who has wounds on her bilateral lateral malleoli. Initially both of these were deep stage IV wounds in the setting of chronic arterial insufficiency. She has been revascularized. As I understand think she been using snap vacs to both of these wounds however the area on the right became more superficial and currently she is only using it on the left. Using silver collagen on the right and silver collagen under the back on the left I believe 09/19/18 on evaluation today patient actually appears to be doing very well in regard to her lateral malleolus or ulcers bilaterally. She has been tolerating the dressing changes without complication. Fortunately there does not appear to be any evidence of infection at this time. Overall I feel like she is improving in an excellent manner and I'm very pleased with the fact that everything seems to be turning towards the better for her. This has obviously been a long road. 09/27/18 on evaluation today patient actually appears to be doing very well in regard to her bilateral lateral malleolus ulcers. She has been tolerating the dressing changes without complication. Fortunately there does not appear to be any evidence of infection at this time which is also great news. No fevers, chills, nausea, or vomiting noted at this time. Overall I feel like she is doing excellent with the snap vac on the left malleolus. She had 40 mL of fluid collection over the past week. 10/04/18 on  evaluation today patient actually appears to be doing well in regard to her bilateral lateral malleolus ulcers. She continues to tolerate the dressing changes without complication. One issue that I see is the snap vac on the left lateral malleolus which appears to have sealed off some fluid underlying this area and has not really allowed it to heal to the degree that I would like to see. For that reason I did suggest at this point we may want to pack a small piece of packing strip into this region to allow it to more effectively wick out fluid. 10/11/18  in general the patient today does not feel that she has been doing very well. She's been a little bit lethargic and subsequently is having bodyaches as well according to what she tells me today. With that being said overall she has been concerned with the fact that something may be worsening although to be honest her wounds really have not been appearing poorly. She does have a new ulcer on her left heel unfortunately. This may be pressure related. Nonetheless it seems to me to have potentially started at least as a blister I do not see any evidence of deep tissue injury. In regard to the left ankle the snap vac still seems to be causing the ceiling off of the deeper part of the wound which is in turn trapping fluid. I'm not extremely pleased with the overall appearance as far as progress from last week to this week therefore I'm gonna discontinue the snap vac at this point. 10/18/18 patient unfortunately this point has not been feeling well for the past several days. She was seen by Grayland Ormond her primary care provider who is a Librarian, academic at [facility]. Subsequently she states that she's been very weak and generally feeling malaise. No fevers, chills, nausea, or vomiting noted at this time. With that being said bloodwork was performed at the PCP office on the 11th of this month which showed a white blood cell count of 10.7. This was  repeated today and shows a white blood cell count of 12.4. This does show signs of worsening. Coupled with the fact that she is feeling worse and that her left ankle wound is not really showing signs of improvement I feel like this is an indication that the osteomyelitis is likely exacerbating not improving. Overall I think we may also want to check her C-reactive protein and sedimentation rate. Actually did call [facility] office this afternoon while the patient was in the office here with me. Subsequently based on the findings we discussed treatment possibilities and I think that it is appropriate for [place] to go ahead and initiate treatment with doxycycline which I'm going to do. Subsequently he did agree to see about adding a CRP and sedimentation rate to her orders. If that has not already been drawn to where they can run it they will contact the patient she can come back to have that check. They are in agreement with plan as far as the patient and her daughter are concerned. Nonetheless also think we need to get in touch with Dr. Henreitta Leber office to see about getting the patient scheduled with him as soon as possible. 11/08/18 on evaluation today patient presents for follow-up concerning her bilateral foot and ankle ulcers. I did do an extensive review of her chart in epic today. Subsequently she was seen by [name] he did initiate Cefepime IV antibiotic therapy. Subsequently she had some issues with her PICC line this had to be removed because it was coiled and then replaced. Fortunately that was now settled. Unfortunately she has continued have issues with her left heel as well as the issues that she is experiencing with her bilateral lateral malleolus regions. I do believe however both areas seem to be doing a little bit better on evaluation today which is good news. No fevers, chills, nausea, or vomiting noted at this time. She actually has an angiogram schedule with Dr. dew on this coming  Monday, November 11, 2018. Subsequently the patient states that she is feeling much better especially than  what she was roughly 2 weeks ago. She actually had to cancel an appointment because she was feeling so poorly. No fevers, chills, nausea, or vomiting noted at this time. 11/15/18 on evaluation today patient actually is status post having had her angiogram with Dr. dew Monday, four days ago. 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 He had to go and work on several areas of the vasculature fortunately he was able to obtain no more than a 30% residual stenosis throughout post procedure. I reviewed this note today. I think this will definitely help with healing at this time. Fortunately there does not appear to be any signs of infection and I do feel like ratio already has a better appearance to it. 11/22/18 upon evaluation today patient actually appears to be doing very well in regard to her wounds in general. The right lateral malleolus looks excellent the heel looks better in the left lateral malleolus also appears to be doing a little better. With that being said the right second toe actually appears to be open and training we been watching this is been dry and stable but now is open. 12/03/2018 Seen today for follow-up and management of multiple bilateral lower extremity wounds. New pressure injury of the great toe which is closed at this time. Wound of the right distal second toe appears larger today with deep undermining and a pocket of fluid present within the undermining region. Left and right malleolus is wounds are stable today with no signs and symptoms of infection.Denies any needs or concerns during exam today. 12/13/18 on evaluation today patient appears to be doing somewhat better in regard to her left heel ulcer. She also seems to be completely healed in regard to the right lateral malleolus ulcer. The left malleolus ulcer is smaller what unfortunately the  wounds which are new over the first and second toes of the right foot are what are most concerning at this point especially the second. Both areas did require sharp debridement today. 12/20/18 on evaluation today patient's wound actually appears to be doing better in regard to left lateral ankle and her right lateral ankle continues to remain healed. The hill ulcer on the left is improved. She does have improvement noted as well in regard to both toe ulcers. Overall I'm very pleased in this regard. No fevers, chills, nausea, or vomiting noted at this time. HILARI, HABLE ([id number]) 12/23/18 on evaluation today patient is seen after she had her toenails trimmed at the podiatrist office due to issues with her right great toe. There was what appeared to be dark eschar on the surface of the wound which had her in the podiatrist concerned. Nonetheless as I remember that during the last office visit I had utilize silver nitrate of this area I was much less concerned about the situation. Subsequently I was able to clean off much of this tissue without any complication today. This does not appear to show any signs of infection and actually look somewhat better compared to last time post debridement. Her second toe on the right foot actually had callous over and there did appear still be some fluid underneath this that would require debridement today. 12/27/18 on evaluation today patient actually appears to be showing signs of improvement at [name] locations. Even the left lateral ankle although this is not quite as great as the other sites. Fortunately there does not appear to be any signs of infection at this time and both  of her toes on the right foot seem to be showing signs of improvement which is good news and very pleased in this regard. 01/03/19 on evaluation today patient appears to be doing better for the most part in regard to her wounds in particular. There does not appear to be any evidence of  infection at this time which is good news. Fortunately there is no sign of really worsening anywhere except for the right great toe which she does have what appears to be a bruise/deep tissue injury which is very superficial and already resolving. I'm not sure where this came from I questioned her extensively and she does not recall what may have happened with this. Other than that the patient seems to be doing well even the left lateral ankle ulcer looks good and is getting smaller. 01/10/19 on evaluation today patient appears to be doing well in regard to her left heel wound and both of her toe wounds. Overall I feel like there is definitely improvement here and I'm happy in that regard. With that being said unfortunately she is having issues with the left lateral malleolus ulcer which unfortunately still has a lot of depth to it. This is gonna be a very difficult wound for [place] to be able to truly get to heal. I may want to consider some type of skin substitute to see if this would be of benefit for her. I'll discuss this with her more the next visit most likely. This was something I thought about more at the end of the visit when I was Artie out of the room and the patient had been discharged. 01/17/19 on evaluation today patient appears to be doing very well in regard to her wounds in general. She's been making excellent progress at this time. Fortunately there's no sign of infection at this time either. No fevers, chills, nausea, or vomiting noted at this time. The biggest issue is still her left lateral malleolus where it appears to be doing well and is getting smaller but still shows a small corner where this is deeper and goes down into what appears to be the joint space. Nonetheless this is taking much longer to heal although it still looks better in smaller than previous evaluations. 01/24/19 on evaluation today patient's wounds actually appear to be doing rather well in general overall. She did  require some sharp debridement in regard to the right great toe but everything else appears to be doing excellent no debridement was even necessary. No fevers, chills, nausea, or vomiting noted at this time. 01/31/19 on evaluation today patient actually appears to be doing much better in regard to her left foot wound on the heel as well as the ankle. The right great toe appears to be a little bit worse today this had callous over and trapped a lot of fluid underneath. Fortunately there's no signs of infection at any site which is great news. 02/07/19 on evaluation today patient actually appears to be doing decently well in regard to [name] of her ulcers at this point. No sharp debridement was required she is a little bit of hyper granulation in regard to the left lateral ankle as well as the left heel but the hill itself is almost completely healed which is excellent news. Overall been very pleased in this regard. 02/14/19 on evaluation today patient actually appears to be doing very well in regard to her ulcers on the right first toe, left lateral malleolus, and left heel. In fact the heel  is almost completely healed at this point. The patient does not show any signs of infection which is good news. Overall very pleased with how things have progressed. 04/18/19 Telehealth Evaluation During the COVID-19 National Emergency: Verbal Consent: Obtained from patient Allergies: reviewed and the active list is current. Medication changes: patient has no current medication changes. COVID-19 Screening: 1. Have you traveled internationally or on a cruise ship in the last 14 dayso No 2. Have you had contact with someone with or under investigation for COVID-19o No 3. Have you had a fever, cough, sore throat, or experiencing shortness of breatho No on evaluation today actually did have a visit with this patient through a telehealth encounter with her home health nurse. Subsequently it was noted that the patient  actually appears to be doing okay in regard to her wounds both the right great toe as well as the left lateral malleolus have shown signs of improvement although this in your theme around the left lateral malleolus there eschar coverings for both locations. The question is whether or not they are actually close and whether or not home health needs to discharge the patient or not. Nonetheless my concern is this point obviously is that without actually seeing her and being able to evaluate this directly I cannot ensure that she is completely healed which is the question that I'm being asked. 04/22/19 on evaluation today patient presents for her first evaluation since last time I saw her which was actually February 14, 2019. I did do a telehealth visit last week in which point it was questionable whether or not she may be healed and had to bring her in today for confirmation. With that being said she does seem to be doing quite well at this point which is good news. There does not appear to be any drainage in the deed I believe her wounds may be healed. Readmission: 09/04/2019 on evaluation today patient appears to be doing unfortunately somewhat more poorly in regard to her left foot ulcer secondary to a wound that began on 08/21/2019 at least when she first noticed this. Fortunately she has not had any evidence of active infection at this time. Systemically. I also do not necessarily see any evidence of infection at the blister/wound site on the first metatarsal head plantar aspect. This almost appears to be something that may have just rubbed inappropriately causing this to breakdown. They did not want a wait too long to come in to be seen as again she had significant issues in the past with wounds that took quite a while to heal in fact it was close to 2 years. Nonetheless this does not appear to be quite that bad but again we do need to remove some of the necrotic tissue from the surface of the wound to  tell exactly the extent. She does not appear to have any significant arterial disease at this point and again her last ABIs and TBI's are recorded above in the alert section her left ABI was 1.27 with a TBI of 0.72 to the right ABI 1.08 with a TBI of 0.39. Other than this the patient has been doing quite well since I last saw her and that was in May 2020. [name], [name] ([id number]) 09/11/2019 on evaluation today patient appeared to be doing very well with regard to her plantar foot ulcer on the left. In fact this appears to be almost completely healed which is awesome. That is after just 1 week of intervention. With that  being said there is no signs of active infection at this time. 09/18/2019 on evaluation today patient actually appears to be doing excellent in fact she is completely healed based on what I am seeing at this point. Fortunately there is no signs of active infection at this time and overall patient is very pleased to hear that this area has healed so quickly. Readmission: 05/13/2021 upon evaluation today this patient presents for reevaluation here in the clinic. This is a wound that actually we previously took care of. She had 1 on the right ankle and the left the left turned out to be be harder due to to heal but nonetheless is doing great at this point as the right that has reopened and it was noted first just several weeks ago with a scab over it and came off in just the past few days. Fortunately there does not appear to be any obvious evidence of significant active infection at this time which is great news. No fevers, chills, nausea, vomiting, or diarrhea. 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 There does not appear to be any signs of this interfering in any way with her wound. She does have swelling we previously had compression socks for her ordered but again it does not look like she wears these on a regular basis by any  means. 05/26/2021 upon evaluation today patient appears to be doing well with regard to her wound which is actually showing signs of excellent improvement. There does not appear to be any signs of active infection which is great news and overall very pleased with where things stand today. No fevers, chills, nausea, vomiting, or diarrhea. 06/02/2021 upon evaluation today patient's wound actually showing signs of excellent improvement. Fortunately there does not appear to be any signs of active infection which is great news. I think the patient is making good progress with regard to her wounds in general. 06/09/2021 upon evaluation today patient appears to be doing excellent in regard to her wounds currently. Fortunately there does not appear to be any signs of active infection which is great news. No fevers, chills, nausea, vomiting, or diarrhea. Overall extremely pleased with where things stand today. I think the patient is making excellent progress. 06/16/2021 upon evaluation today patient appears to be doing well in regard to her wound. This is going require little bit of debridement today and that was discussed with the patient. Otherwise she seems to be doing quite well and I am actually very pleased with where things stand at this point. No fevers, chills, nausea, vomiting, or diarrhea. 06/23/2021 upon evaluation today patient appears to be doing well with regard to her wounds. She has been tolerating the dressing changes without complication. Fortunately there does not appear to be any evidence of infection and she has not had air in her home which she actually lives at an assisted living that got fixed this morning. With that being said because of that her wrap has been extremely hot and bothersome for her over the past week. 06/30/2021 upon evaluation today patient is actually making excellent progress in regard to her ankle ulcer. She has been tolerating the dressing changes without complication  and overall extremely pleased with where things stand there does not appear to be any evidence of active infection which is great news. No fevers, chills, nausea, vomiting, or diarrhea. 07/07/21 upon evaluation today patients and culture on the right actually appears to be doing quite  well. There does not appear to be any signs of infection and overall very pleased with where things stand today. No fevers, chills, nausea, or vomiting noted at this time. 07/14/2021 unfortunately the patient today has some evidence of deep tissue injury and pressure getting to the ankle region. Again I am not exactly sure what is going on here but this is very similar to issues that we have had in the past. I explained to the patient that she needs to be very mindful of exactly what is happening I think sleeping in bed is probably the main issue here although there could be other culprits I am not sure what else would potentially lead to this kind of a problem for her. 07/21/2021 upon evaluation today patient's wound actually showing signs of improvement compared to last week. Fortunately there does not appear to be any signs of active infection which is great news and overall very pleased with where things stand in that regard. With that being said I do believe that she is continuing to show signs of overall of getting better although I think this is still basically about what we were 2 weeks ago due to the worsening and now improvement. 07/28/2021 upon evaluation today patient appears to be doing well with regard to her wound. She does have some slough buildup on the surface of the wound which I would have to manage today. Fortunately there is no sign of active infection at this time. No fevers, chills, nausea, vomiting, or diarrhea. 08/04/2021 upon evaluation today patient appears to be doing about the same in regard to her wound. To be perfectly honest I am beginning to be a little bit concerned about the overall  appearance of the wound bed. I do think possibly taking a sample right around the margin of the wound could be beneficial for her as far as identifying anything such as an inflammatory process or to be honest even a skin tag cancer type process that may be of concern here. Fortunately there does not appear to be any evidence of active infection at this time which is great news she is not having any pain also great news. Objective Constitutional Well-nourished and well-hydrated in no acute distress. Vitals Time Taken: 10:21 AM, Height: 62 in, Weight: 150 lbs, BMI: 27.4, Temperature: 98 F, Pulse: 85 bpm, Respiratory Rate: 16 breaths/min, Leece, Lanell G. ([id number]) Blood Pressure: 143/67 mmHg. Respiratory normal breathing without difficulty. Psychiatric this patient is able to make decisions and demonstrates good insight into disease process. Alert and Oriented x 3. pleasant and cooperative. General Notes: Upon inspection patient's wound bed actually showed signs of good granulation and epithelization at this point. Fortunately there does not appear to be any evidence of active infection systemically which is great news and overall I am extremely pleased with where things stand today. No fevers, chills, nausea, vomiting, or diarrhea. Integumentary (Hair, Skin) Wound #7 status is Open. Original cause of wound was Gradually Appeared. The date acquired was: 05/12/2021. The wound has been in treatment 11 weeks. The wound is located on the Right,Lateral Malleolus. The wound measures 1cm length x 1cm width x 0.2cm depth; 0.785cm^2 area and 0.157cm^3 volume. There is Fat Layer (Subcutaneous Tissue) exposed. There is no tunneling or undermining noted. There is a medium amount of serosanguineous drainage noted. The wound margin is distinct with the outline attached to the wound base. There is small (1-33%) red, pink, pale granulation within the wound bed. There is a large (67-100%) amount of  necrotic  tissue within the wound bed. Assessment Active Problems ICD-10 Pressure ulcer of right ankle, stage 3 Lymphedema, not elsewhere classified Venous insufficiency (chronic) (peripheral) Presence of cardiac pacemaker Long term (current) use of anticoagulants Procedures Wound #7 Pre-procedure diagnosis of Wound #7 is a Pressure Ulcer located on the Right,Lateral Malleolus . There was a Excisional Skin/Subcutaneous Tissue Debridement with a total area of 1 sq cm performed by Tommie Sams., PA-C. With the following instrument(s): Curette to remove Viable and Non-Viable tissue/material. Material removed includes Callus, Subcutaneous Tissue, Skin: Dermis, and Skin: Epidermis after achieving pain control using Lidocaine 4% Topical Solution. 1 specimen was taken by a Tissue Culture and sent to the lab per facility protocol. A time out was conducted at 10:33, prior to the start of the procedure. A Minimum amount of bleeding was controlled with Pressure. The procedure was tolerated well with a pain level of 0 throughout and a pain level of 0 following the procedure. Post Debridement Measurements: 1cm length x 1cm width x 0.2cm depth; 0.157cm^3 volume. Post debridement Stage noted as Category/Stage III. Character of Wound/Ulcer Post Debridement is improved. Post procedure Diagnosis Wound #7: Same as Pre-Procedure Plan Follow-up Appointments: Return Appointment in 1 week. Bathing/ Shower/ Hygiene: May shower with wound dressing protected with water repellent cover or cast protector. Edema Control - Lymphedema / Segmental Compressive Device / Other: Optional: One layer of unna paste to top of compression wrap (to act as an anchor). - and at toes Elevate, Exercise Daily and Avoid Standing for Long Periods of Time. Elevate legs to the level of the heart and pump ankles as often as possible Elevate leg(s) parallel to the floor when sitting. WOUND #7: - Malleolus Wound Laterality: Right,  Lateral Cleanser: Soap and Water 1 x Per Week/30 Days Discharge Instructions: Gently cleanse wound with antibacterial soap, rinse and pat dry prior to dressing wounds Primary Dressing: Prisma 4.34 (in) 1 x Per Week/30 Days Discharge Instructions: Moisten w/normal saline or sterile water; Cover wound as directed. Do not remove from wound bed. Primary Dressing: Curad Oil Emulsion Dressing 3x3 (in/in) 1 x Per Week/30 Days KATRINKA, [place] ([id number]) Discharge Instructions: over prisma Secondary Dressing: ABD Pad 5x9 (in/in) 1 x Per Week/30 Days Discharge Instructions: Cover with ABD pad Compression Wrap: Profore Lite LF 3 Multilayer Compression Bandaging System 1 x Per Week/30 Days Discharge Instructions: Apply 3 multi-layer wrap as prescribed. 1. Would recommend currently that we go ahead and obtain a sample biopsy from the edges of the wound. I did perform a very light debridement but then subsequently got a biopsy sample from the base of the wound and right at the margin I tried to be very conservative with this as another the patient is concerned about making the wound larger. Hopefully this is still a sufficient sample for them to be able to evaluate. 2. I am also going to recommend that we continue with the 3 layer compression wrap which I think is doing a good job. 3. Were also going to continue with the collagen and see how this looks over the next week. We will see patient back for reevaluation in 1 week here in the clinic. If anything worsens or changes patient will contact our office for additional recommendations. Electronic Signature(s) Signed: 08/04/2021 10:49:42 AM By: Worthy Keeler PA-C Entered By: Worthy Keeler on 08/04/2021 10:49:42 Heiland, [name] ([id number]) -------------------------------------------------------------------------------- SuperBill Details Patient Name: [name] Date of Service: 08/04/2021 Medical Record Number: [id number] Patient Account  Number: [id number] Date of Birth/Sex: 1923/03/06 (87 y.o. F) Treating RN: Carlene Coria Primary Care Provider: Adrian Prows Other Clinician: Referring Provider: Adrian Prows Treating Provider/Extender: Skipper Cliche in Treatment: 11 Diagnosis Coding ICD-10 Codes Code Description [phone] Pressure ulcer of right ankle, stage 3 I89.0 Lymphedema, not elsewhere classified I87.2 Venous insufficiency (chronic) (peripheral) Z95.0 Presence of cardiac pacemaker Z79.01 Long term (current) use of anticoagulants Facility Procedures CPT4 Code: [id number] Description: [id number] - DEB SUBQ TISSUE 20 SQ CM/< Modifier: Quantity: 1 CPT4 Code: Description: ICD-10 Diagnosis Description 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, stage 3 Modifier: Quantity: Physician Procedures CPT4 [id number] Description: [id number] - WC PHYS LEVEL 4 - EST PT Modifier: 25 Quantity: 1 CPT4 Code: Description: ICD-10 Diagnosis Description 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, stage 3 I89.0 Lymphedema, not elsewhere classified I87.2 Venous insufficiency (chronic) (peripheral) Z95.0 Presence of cardiac pacemaker Modifier: Quantity: CPT4 Code: [id number] Description: [id number] - WC PHYS SUBQ TISS 20 SQ CM Modifier: Quantity: 1 CPT4 Code: Description: ICD-10 Diagnosis Description 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, stage 3 Modifier: Quantity: Electronic Signature(s) Signed: 08/04/2021 10:49:58 AM By: Worthy Keeler PA-C Entered By: Worthy Keeler on 08/04/2021 10:49:58

## 2021-08-05 LAB — SURGICAL PATHOLOGY

## 2021-08-11 ENCOUNTER — Other Ambulatory Visit
Admission: RE | Admit: 2021-08-11 | Discharge: 2021-08-11 | Disposition: A | Discharge status: Home / Self Care | Payer: Medicare Other | Source: Ambulatory Visit | Attending: Physician Assistant | Admitting: Physician Assistant

## 2021-08-11 ENCOUNTER — Encounter: Payer: Medicare Other | Admitting: Physician Assistant

## 2021-08-11 ENCOUNTER — Other Ambulatory Visit: Payer: Self-pay

## 2021-08-11 DIAGNOSIS — L89513 Pressure ulcer of right ankle, stage 3: Secondary | ICD-10-CM | POA: Diagnosis not present

## 2021-08-11 DIAGNOSIS — L089 Local infection of the skin and subcutaneous tissue, unspecified: Secondary | ICD-10-CM | POA: Insufficient documentation

## 2021-08-11 NOTE — Progress Notes (Addendum)
[name], [name] ([id number]) Visit Report for 08/11/2021 Chief Complaint Document Details Patient Name: [name], [name]. Date of Service: 08/11/2021 10:15 AM Medical Record Number: [id number] Patient Account Number: [id number] Date of Birth/Sex: [name] (87 y.o. F) Treating RN: Carlene Coria Primary Care Provider: Adrian Prows Other Clinician: Referring Provider: Adrian Prows Treating Provider/Extender: Skipper Cliche in Treatment: 12 Information Obtained from: Patient Chief Complaint Right foot ulcer Electronic Signature(s) Signed: 08/11/2021 10:26:42 AM By: Worthy Keeler PA-C Entered By: Worthy Keeler on 08/11/2021 10:26:42 [name] ([id number]) -------------------------------------------------------------------------------- Debridement Details Patient Name: [name] Date of Service: 08/11/2021 10:15 AM Medical Record Number: [id number] Patient Account Number: [id number] Date of Birth/Sex: [name] (87 y.o. F) Treating RN: Carlene Coria Primary Care Provider: Adrian Prows Other Clinician: Referring Provider: Adrian Prows Treating Provider/Extender: Skipper Cliche in Treatment: 12 Debridement Performed for Wound #7 Right,Lateral Malleolus Assessment: Performed By: Physician Tommie Sams., PA-C Debridement Type: Debridement Level of Consciousness (Pre- Awake and Alert procedure): Pre-procedure Verification/Time Out Yes - 10:30 Taken: Start Time: 10:30 Total Area Debrided (L x W): 1 (cm) x 1 (cm) = 1 (cm) Tissue and other material Viable, Non-Viable, Slough, Subcutaneous, Skin: Dermis , Skin: Epidermis, Slough debrided: Level: Skin/Subcutaneous Tissue Debridement Description: Excisional Instrument: Curette Specimen: Tissue Culture Number of Specimens Taken: 1 Bleeding: Moderate Hemostasis Achieved: Pressure End Time: 10:34 Procedural Pain: 0 Post Procedural Pain: 0 Response to Treatment: Procedure was tolerated  well Level of Consciousness (Post- Awake and Alert procedure): Post Debridement Measurements of Total Wound Length: (cm) 1 Stage: Category/Stage III Width: (cm) 1 Depth: (cm) 0.2 Volume: (cm) 0.157 Character of Wound/Ulcer Post Debridement: Improved Post Procedure Diagnosis Same as Pre-procedure Electronic Signature(s) Signed: 08/12/2021 11:43:44 AM By: Worthy Keeler PA-C Signed: 08/17/2021 7:54:12 AM By: Carlene Coria RN Entered By: Carlene Coria on 08/11/2021 10:31:18 [name] ([id number]) -------------------------------------------------------------------------------- HPI Details Patient Name: [name] Date of Service: 08/11/2021 10:15 AM Medical Record Number: [id number] Patient Account Number: [id number] Date of Birth/Sex: [name] (87 y.o. 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 Dr. Amalia Hailey the podiatrist saw her on 07/20/2017 and sent her to the wound center for possible hyperbaric oxygen therapy. past medical history of peripheral vascular disease, varicose veins, status post appendectomy, basal cell carcinoma excision from the left leg, cholecystectomy, pacemaker placement, right lower extremity angiography done by Dr. dew in March 2017 with placement of a stent. there is also note of a successful ablation of the right small saphenous vein done which was reviewed by ultrasound on 10/24/2016. the patient had a right small saphenous vein ablation done on 10/20/2016. The patient has never been a smoker. She has been seen by Dr. Corene Cornea dew the vascular surgeon who most recently  saw her on 06/15/2017 for evaluation of ongoing problems with right leg swelling. She had a lower extremity arterial duplex examination done(02/13/17) which showed patent distal right superficial femoral artery stent and above-the-knee popliteal stent without evidence of restenosis. The ABI was more than 1.3 on the right and more than 1.3 on the left. This was consistent with noncompressible arteries due to medial calcification. The right great toe pressure and PPG waveforms are within normal limits and  the left great toe pressure and PPG waveforms are decreased. he recommended she continue to wear her compression stockings and continue with elevation. She is scheduled to have a noninvasive arterial study in the near future 08/16/2017 -- had a lower extremity arterial duplex examination done which showed patent distal right superficial femoral artery stent and above-the-knee popliteal stent without evidence of restenosis. The ABI was more than 1.3 on the right and more than 1.3 on the left. This was consistent with noncompressible arteries due to medial calcification. The right great toe pressure and PPG waveforms are within normal limits and the left great toe pressure and PPG waveforms are decreased. the x-ray of the right ankle has not yet been done 08/24/2017 -- had a right ankle x-ray -- IMPRESSION:1. No fracture, bone lesion or evidence of osteomyelitis. 2. Lateral soft tissue swelling with a soft tissue ulcer. she has not yet seen the vascular surgeon for review 08/31/17 on evaluation today patient's wound appears to be showing signs of improvement. She still with her appointment with vascular in order to review her results of her vascular study and then determine if any intervention would be recommended at that time. No fevers, chills, nausea, or vomiting noted at this time. She has been tolerating the dressing changes without complication. 09/28/17 on evaluation today patient's wound appears  to show signs of good improvement in regard to the granulation tissue which is surfacing. There is still a layer of slough covering the wound and the posterior portion is still significantly deeper than the anterior nonetheless there has been some good sign of things moving towards the better. She is going to go back to Dr. dew for reevaluation to ensure her blood flow is still appropriate. That will be before her next evaluation with [place] next week. No fevers, chills, nausea, or vomiting noted at this time. Patient does have some discomfort rated to be a 3-4/10 depending on activity specifically cleansing the wound makes it worse. 10/05/2017 -- the patient was seen by Dr. Lucky Cowboy last week and noninvasive studies showed a normal right ABI with brisk triphasic waveforms consistent with no arterial insufficiency including normal digital pressures. The duplex showed a patent distal right SFA stent and the proximal SFA was also normal. He was pleased with her test and thought she should have enough of perfusion for normal wound healing. He would see her back in 6 months time. 12/21/17 on evaluation today patient appears to be doing fairly well in regard to her right lateral ankle wound. Unfortunately the main issue that she is expansion at this point is that she is having some issues with what appears to be some cellulitis in the right anterior shin. She has also been noting a little bit of uncomfortable feeling especially last night and her ankle area. I'm afraid that she made the developing a little bit of an infection. With that being said I think it is in the early stages. 12/28/17 on evaluation today patient's ankle appears to be doing excellent. She's making good progress at this point the cellulitis seems to have improved after last week's evaluation. Overall she is having no significant discomfort which is excellent news. She does have an appointment with Dr. dew on March 29, 2018 for reevaluation in  regard to the stent he placed. She seems to have excellent blood flow in the right lower extremity. 01/19/12 on evaluation today patient's wound appears to be doing very well. In fact she does not appear to require debridement at  this point, there's no evidence of infection, and overall from the standpoint of the wound she seems to be doing very well. With that being said I believe that it [name] be time to switch to different dressing away from the [facility] Dressing she tells me she does have a lot going on her friend actually passed away yesterday and she's also having a lot of issues with her husband this obviously is weighing heavy on her as far as your thoughts and concerns today. 01/25/18 on evaluation today patient appears to be doing fairly well in regard to her right lateral malleolus. She has been tolerating the dressing changes without complication. Overall I feel like this is definitely showing signs of improvement as far as how the overall appearance of the wound is there's also evidence of epithelium start to migrate over the granulation tissue. In general I think that she is progressing nicely as far as the wound is concerned. The only concern she really has is whether or not we can switch to every other week visits in order to avoid having as many appointments as her daughters have a difficult time getting her to her appointments as well as the patient's husband to his he is not doing very well at this point. 02/22/18 on evaluation today patient's right lateral malleolus ulcer appears to be doing great. She has been tolerating the dressing changes without complication. Overall you making excellent progress at this time. Patient is having no significant discomfort. [name], [name] ([id number]) 03/15/18 on evaluation today patient appears to be doing much more poorly in regard to her right lateral ankle ulcer at this point. Unfortunately since have last seen her her husband has passed  just a few days ago is obviously weighed heavily on her her daughter also had surgery well she is with her today as usual. There does not appear to be any evidence of infection she does seem to have significant contusion/deep tissue injury to the right lateral malleolus which was not noted previous when I saw her last. It's hard to tell of exactly when this injury occurred although during the time she was spending the night in the hospital this [name] have been most likely. 03/22/18 on evaluation today patient appears to actually be doing very well in regard to her ulcer. She did unfortunately have a setback which was noted last week however the good news is we seem to be getting back on track and in fact the wound in the core did still have some necrotic tissue which will be addressed at this point today but in general I'm seeing signs that things are on the up and up. She is glad to hear this obviously she's been somewhat concerned that due to the how her wound digressed more recently. 03/29/18 on evaluation today patient appears to be doing fairly well in regard to her right lower extremity lateral malleolus ulcer. She unfortunately does have a new area of pressure injury over the inferior portion where the wound has opened up a little bit larger secondary to the pressure she seems to be getting. She does tell me sometimes when she sleeps at night that it actually hurts and does seem to be pushing on the area little bit more unfortunately. There does not appear to be any evidence of infection which is good news. She has been tolerating the dressing changes without complication. She also did have some bruising in the left second and third toes due to the fact that she  [name] have bump this or injured it although she has neuropathy so she does not feel she did move recently that [name] have been where this came from. Nonetheless there does not appear to be any evidence of infection at this time. 04/12/18 on  evaluation today patient's wound on the right lateral ankle actually appears to be doing a little bit better with a lot of necrotic docking tissue centrally loosening up in clearing away. However she does have the beginnings of a deep tissue injury on the left lateral malleolus likely due to the fact we've been trying offload the right as much as we have. I think she [name] benefit from an assistive soft device to help with offloading and it looks like they're looking at one of the doughnut conditions that wraps around the lower leg to offload which I think will definitely do a good job. With that being said I think we definitely need to address this issue on the left before it becomes a wound. Patient is not having significant pain. 04/19/18 on evaluation today patient appears to be doing excellent in regard to the progress she's made with her right lateral ankle ulcer. The left ankle region which did show evidence of a deep tissue injury seems to be resolving there's little fluid noted underneath and a blister there's nothing open at this point in time overall I feel like this is progressing nicely which is good news. She does not seem to be having significant discomfort at this point which is also good news. 04/25/18-She is here in follow up evaluation for bilateral lateral malleolar ulcers. The right lateral malleolus ulcer with pale subcutaneous tissue exposure, central area of ulcer with tendon/periosteum exposed. The left lateral malleolus ulcer now with central area of nonviable tissue, otherwise deep tissue injury. She is wearing compression wraps to the left lower extremity, she will place the right lower extremity compression wraps on when she gets home. She will be out of town over the weekend and return next week and follow-up appointment. She completed her doxycycline this morning 05/03/18 on evaluation today patient appears to be doing very well in regard to her right lateral ankle ulcer in  general. At least she's showing some signs of improvement in this regard. Unfortunately she has some additional injury to the left lateral malleolus region which appears to be new likely even over the past several days. Again this determination is based on the overall appearance. With that being said the patient is obviously frustrated about this currently. 05/10/18-She is here in follow-up evaluation for bilateral lateral malleolar ulcers. She states she has purchased offloading shoes/boots and they will arrive tomorrow. She was asked to bring them in the office at next week's appointment so her provider is aware of product being utilized. She continues to sleep on right or left side, she has been encouraged to sleep on her back. The right lateral malleolus ulcer is precariously close to peri-osteum; will order xray. The left lateral malleolus ulcer is improved. Will switch back to santyl; she will follow up next week. 05/17/18 on evaluation today patient actually appears to be doing very well in regard to her malleolus her ulcers compared to last time I saw them. She does not seem to have as much in the way of contusion at this point which is great news. With that being said she does continue to have discomfort and I do believe that she is still continuing to benefit from the offloading/pressure reducing boots that were  recommended. I think this is the key to trying to get this to heal up completely. 05/24/18 on evaluation today patient actually appears to be doing worse at this point in time unfortunately compared to her last week's evaluation. She is having really no increased pain which is good news unfortunately she does have more maceration in your theme and noted surrounding the right lateral ankle the left lateral ankle is not really is erythematous I do not see signs of the overt cellulitis on that side. Unfortunately the wounds do not seem to have shown any signs of improvement since the last  evaluation. She also has significant swelling especially on the right compared to previous some of this [name] be due to infection however also think that she [name] be served better while she has these wounds by compression wrapping versus continuing to use the Juxta-Lite for the time being. Especially with the amount of drainage that she is experiencing at this point. No fevers, chills, nausea, or vomiting noted at this time. 05/31/18 on evaluation today patient appears to actually be doing better in regard to her right lateral lower extremity ulcer specifically on the malleolus region. She has been tolerating the antibiotic without complication. With that being said she still continues to have issues but a little bit of redness although nothing like she what she was experiencing previous. She still continues to pressure to her ankle area she did get the problem on offloading boots unfortunately she will not wear them she states there too uncomfortable and she can't get in and out of the bed. Nonetheless at this point her wounds seem to be continually getting worse which is not what we want I'm getting somewhat concerned about her progress and how things are going to proceed if we do not intervene in some way shape or form. I therefore had a very lengthy conversation today about offloading yet again and even made a specific suggestion for switching her to a memory foam mattress and even gave the information for a specific one that they could look at getting if it was something that they were interested in considering. She does not want to be considered for a hospital bed air mattress although honestly insurance would not cover it that she does not have any wounds on her trunk. 06/14/18 on evaluation today both wounds over the bilateral lateral malleolus her ulcers appear to be doing better there's no evidence of pressure injury at this point. She did get the foam mattress for her bed and this does seem to  have been extremely beneficial for her in my pinion. Her daughter states that she is having difficulty getting out of bed because of how soft it is. The patient also relates this to be. Nonetheless I do feel like she's actually doing better. Unfortunately right after and around the time she was getting the mattress she also sustained a fall when she got up to go pick up the phone and ended up injuring her right elbow she has 18 sutures in place. We are not caring for this currently although home health is going to be taking the sutures out shortly. Nonetheless this [name] be something that we need to evaluate going forward. It depends on how well it has or has not healed in the end. She also recently saw an orthopedic specialist for an injection in the right shoulder just before her fall unfortunately the fall seems to have worsened her pain. 06/21/18 on evaluation today patient appears to be  doing about the same in regard to her lateral malleolus ulcers. Both appear to be just a little bit deeper but again we are clinging away the necrotic and dead tissue which I think is why this is progressing towards a deeper realm as opposed [name], [name]. ([id number]) to improving from my measurement standpoint in that regard. Nonetheless she has been tolerating the dressing changes she absolutely hates the memory foam mattress topper that was obtained for her nonetheless I do believe this is still doing excellent as far as taking care of excess pressure in regard to the lateral malleolus regions. She in fact has no pressure injury that I see whereas in weeks past it was week by week I was constantly seeing new pressure injuries. Overall I think it has been very beneficial for her. 07/03/18; patient arrives in my clinic today. She has deep punched out areas over her bilateral lateral malleoli. The area on the right has some more depth. We spent a lot of time today talking about pressure relief for these areas.  This started when her daughter asked for a prescription for a memory foam mattress. I have never written a prescription for a mattress and I don't think insurances would pay for that on an ordinary bed. In any case he came up that she has foam boots that she refuses to wear. I would suggest going to these before any other offloading issues when she is in bed. They say she is meticulous about offloading this the rest of the day 07/10/18- She is seen in follow-up evaluation for bilateral, lateral malleolus ulcers. There is no improvement in the ulcers. She has purchased and is sleeping on a memory foam mattress/overlay, she has been using the offloading boots nightly over the past week. She has a follow up appointment with vascular medicine at the end of October, in my opinion this follow up should be expedited given her deterioration and suboptimal TBI results. We will order plain film xray of the left ankle as deeper structures are palpable; would consider having MRI, regardless of xray report(s). The ulcers will be treated with iodoflex/iodosorb, she is unable to safely change the dressings daily with santyl. 07/19/18 on evaluation today patient appears to be doing in general visually well in regard to her bilateral lateral malleolus ulcers. She has been tolerating the dressing changes without complication which is good news. With that being said we did have an x-ray performed on 07/12/18 which revealed a slight loosen see in the lateral portion of the distal left fibula which [name] represent artifact but underline lytic destruction or osteomyelitis could not be excluded. MRI was recommended. With that being said we can see about getting the patient scheduled for an MRI to further evaluate this area. In fact we have that scheduled currently for August 20 19,019. 07/26/18 on evaluation today patient's wound on the right lateral ankle actually appears to be doing fairly well at this point in my pinion. She  has made some good progress currently. With that being said unfortunately in regard to the left lateral ankle ulcer this seems to be a little bit more problematic at this time. In fact as I further evaluated the situation she actually had bone exposed which is the first time that's been the case in the bone appear to be necrotic. Currently I did review patient's note from Dr. Bunnie Domino office with [facility]. He stated that ABI was 1.26 on the right and 0.95 on the left  with good waveforms. Her perfusion is stable not reduced from previous studies and her digital waveforms were pretty good particularly on the right. His conclusion upon review of the note was that there was not much she could do to improve her perfusion and he felt she was adequate for wound healing. His suggestion was that she continued to see [place] and consider a synthetic skin graft if there was no underlying infection. He plans to see her back in six months or as needed. 08/01/18 on evaluation today patient appears to be doing better in regard to her right lateral ankle ulcer. Her left lateral ankle ulcer is about the same she still has bone involvement in evidence of necrosis. There does not appear to be evidence of infection at this time On the right lateral lower extremity. I have started her on the Augmentin she picked this up and started this yesterday. This is to get her through until she sees infectious disease which is scheduled for 08/12/18. 08/06/18 on evaluation today patient appears to be doing rather well considering my discussion with patient's daughter at the end of last week. The area which was marked where she had erythema seems to be improved and this is good news. With that being said overall the patient seems to be making good improvement when it comes to the overall appearance of the right lateral ankle ulcer although this has been slow she at least is coming around in this regard. Unfortunately in  regard to the left lateral ankle ulcer this is osteomyelitis based on the pathology report as well is bone culture. Nonetheless we are still waiting CT scan. Unfortunately the MRI we originally ordered cannot be performed as the patient is a pacemaker which I had overlooked. Nonetheless we are working on the CT scan approval and scheduling as of now. She did go to the hospital over the weekend and was placed on IV Cefzo for a couple of days. Fortunately this seems to have improved the erythema quite significantly which is good news. There does not appear to be any evidence of worsening infection at this time. She did have some bleeding after the last debridement therefore I did not perform any sharp debridement in regard to left lateral ankle at this point. Patient has been approved for a snap vac for the right lateral ankle. 08/14/18; the patient with wounds over her bilateral lateral malleoli. The area on the right actually looks quite good. Been using a snap back on this area. Healthy granulation and appears to be filling in. Unfortunately the area on the left is really problematic. 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 Also noted to have cellulitis. She saw Dr. Novella Olive of infectious disease today and was put on linezolid. We are able to verify this with her pharmacy. She is completed the Augmentin that she was already on. We've been using Iodoflex to this area 08/23/18 on evaluation today patient's wounds both actually appear to be doing better compared to my prior evaluations. Fortunately she showing signs of good improvement in regard to the overall wound status especially where were using the snap vac on the right. In regard to left lateral malleolus the wound bed actually appears to be much cleaner than previously noted. I do not feel any phone directly probed during evaluation today and though there is tendon  noted this does not appear to be necrotic it's actually fairly good as  far as the overall appearance of the tendon is concerned. In general the wound bed actually appears to be doing significantly better than it was previous. Patient is currently in the care of [name] and I did review that note today. He actually has her on two weeks of linezolid and then following the patient will be on 1-2 months of Keflex. That is the plan currently. She has been on antibiotics therapy as prescribed by myself initially starting on July 30, 2018 and has been on that continuously up to this point. 08/30/18 on evaluation today patient actually appears to be doing much better in regard to her right lateral malleolus ulcer. She has been tolerating the dressing changes specifically the snap vac without complication although she did have some issues with the seal currently. Apparently there was some trouble with getting it to maintain over the past week past Sunday. Nonetheless overall the wound appears better in regard to the right lateral malleolus region. In regard to left lateral malleolus this actually show some signs of additional granulation although there still tendon noted in the base of the wound this appears to be healthy not necrotic in any way whatsoever. We are considering potentially using a snap vac for the left lateral malleolus as well the product wrap from [employer], [place], was present in the clinic today we're going to see this patient I did have her come in with me after obtaining consent from the patient and her daughter in order to look at the wound and see if there's any recommendation one way or another as to whether or not they felt the snapback could be beneficial for the left lateral malleolus region. But the conclusion was that it might be but that this is definitely a little bit deeper wound than what traditionally would be utilized for a snap vac. 09/06/18 on evaluation today patient actually  appears to be doing excellent in my pinion in regard to both ankle ulcers. She has been tolerating the dressing changes without complication which is great news. Specifically we have been using the snap vac. In regard to the right ankle I'm not even sure that this is going to be necessary for today and following as the wound has filled in quite nicely. In regard to the left ankle I do believe [name], [name] ([id number]) that we're seeing excellent epithelialization from the edge as well as granulation in the central portion the tendon is still exposed but there's no evidence of necrotic bone and in general I feel like the patient has made excellent progress even compared to last week with just one week of the snap vac. 09/11/18; this is a patient who has wounds on her bilateral lateral malleoli. Initially both of these were deep stage IV wounds in the setting of chronic arterial insufficiency. She has been revascularized. As I understand think she been using snap vacs to both of these wounds however the area on the right became more superficial and currently she is only using it on the left. Using silver collagen on the right and silver collagen under the back on the left I believe 09/19/18 on evaluation today patient actually appears to be doing very well in regard to her lateral malleolus or ulcers bilaterally. She has been tolerating the dressing changes without complication. Fortunately there does not appear to be any evidence of infection at this time. Overall I feel like she is improving in an excellent manner and I'm very pleased with the fact that everything  seems to be turning towards the better for her. This has obviously been a long road. 09/27/18 on evaluation today patient actually appears to be doing very well in regard to her bilateral lateral malleolus ulcers. She has been tolerating the dressing changes without complication. Fortunately there does not appear to be any evidence of  infection at this time which is also great news. No fevers, chills, nausea, or vomiting noted at this time. Overall I feel like she is doing excellent with the snap vac on the left malleolus. She had 40 mL of fluid collection over the past week. 10/04/18 on evaluation today patient actually appears to be doing well in regard to her bilateral lateral malleolus ulcers. She continues to tolerate the dressing changes without complication. One issue that I see is the snap vac on the left lateral malleolus which appears to have sealed off some fluid underlying this area and has not really allowed it to heal to the degree that I would like to see. For that reason I did suggest at this point we [name] want to pack a small piece of packing strip into this region to allow it to more effectively wick out fluid. 10/11/18 in general the patient today does not feel that she has been doing very well. She's been a little bit lethargic and subsequently is having bodyaches as well according to what she tells me today. With that being said overall she has been concerned with the fact that something [name] be worsening although to be honest her wounds really have not been appearing poorly. She does have a new ulcer on her left heel unfortunately. This [name] be pressure related. Nonetheless it seems to me to have potentially started at least as a blister I do not see any evidence of deep tissue injury. In regard to the left ankle the snap vac still seems to be causing the ceiling off of the deeper part of the wound which is in turn trapping fluid. I'm not extremely pleased with the overall appearance as far as progress from last week to this week therefore I'm gonna discontinue the snap vac at this point. 10/18/18 patient unfortunately this point has not been feeling well for the past several days. She was seen by Grayland Ormond her primary care provider who is a Librarian, academic at [facility]. Subsequently she states that  she's been very weak and generally feeling malaise. No fevers, chills, nausea, or vomiting noted at this time. With that being said bloodwork was performed at the PCP office on the 11th of this month which showed a white blood cell count of 10.7. This was repeated today and shows a white blood cell count of 12.4. This does show signs of worsening. Coupled with the fact that she is feeling worse and that her left ankle wound is not really showing signs of improvement I feel like this is an indication that the osteomyelitis is likely exacerbating not improving. Overall I think we [name] also want to check her C-reactive protein and sedimentation rate. Actually did call [facility] office this afternoon while the patient was in the office here with me. Subsequently based on the findings we discussed treatment possibilities and I think that it is appropriate for [place] to go ahead and initiate treatment with doxycycline which I'm going to do. Subsequently he did agree to see about adding a CRP and sedimentation rate to her orders. If that has not already been drawn to where they can run it they will  contact the patient she can come back to have that check. They are in agreement with plan as far as the patient and her daughter are concerned. Nonetheless also think we need to get in touch with Dr. Henreitta Leber office to see about getting the patient scheduled with him as soon as possible. 11/08/18 on evaluation today patient presents for follow-up concerning her bilateral foot and ankle ulcers. I did do an extensive review of her chart in epic today. Subsequently she was seen by [name] he did initiate Cefepime IV antibiotic therapy. Subsequently she had some issues with her PICC line this had to be removed because it was coiled and then replaced. Fortunately that was now settled. Unfortunately she has continued have issues with her left heel as well as the issues that she is experiencing with her bilateral  lateral malleolus regions. I do believe however both areas seem to be doing a little bit better on evaluation today which is good news. No fevers, chills, nausea, or vomiting noted at this time. She actually has an angiogram schedule with Dr. dew on this coming Monday, November 11, 2018. Subsequently the patient states that she is feeling much better especially than what she was roughly 2 weeks ago. She actually had to cancel an appointment because she was feeling so poorly. No fevers, chills, nausea, or vomiting noted at this time. 11/15/18 on evaluation today patient actually is status post having had her angiogram with Dr. dew Monday, four days ago. 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 He had to go and work on several areas of the vasculature fortunately he was able to obtain no more than a 30% residual stenosis throughout post procedure. I reviewed this note today. I think this will definitely help with healing at this time. Fortunately there does not appear to be any signs of infection and I do feel like ratio already has a better appearance to it. 11/22/18 upon evaluation today patient actually appears to be doing very well in regard to her wounds in general. The right lateral malleolus looks excellent the heel looks better in the left lateral malleolus also appears to be doing a little better. With that being said the right second toe actually appears to be open and training we been watching this is been dry and stable but now is open. 12/03/2018 Seen today for follow-up and management of multiple bilateral lower extremity wounds. New pressure injury of the great toe which is closed at this time. Wound of the right distal second toe appears larger today with deep undermining and a pocket of fluid present within the undermining region. Left and right malleolus is wounds are stable today with no signs and symptoms of infection.Denies any needs or concerns during  exam today. 12/13/18 on evaluation today patient appears to be doing somewhat better in regard to her left heel ulcer. She also seems to be completely healed in regard to the right lateral malleolus ulcer. The left malleolus ulcer is smaller what unfortunately the wounds which are new over the first and second toes of the right foot are what are most concerning at this point especially the second. Both areas did require sharp debridement today. 12/20/18 on evaluation today patient's wound actually appears to be doing better in regard to left lateral ankle and her right lateral ankle continues to remain healed. The hill ulcer on the left is improved. She does have improvement noted as well in regard to both  toe ulcers. Overall I'm very pleased in this regard. No fevers, chills, nausea, or vomiting noted at this time. 12/23/18 on evaluation today patient is seen after she had her toenails trimmed at the podiatrist office due to issues with her right great toe. There was what appeared to be dark eschar on the surface of the wound which had her in the podiatrist concerned. Nonetheless as I remember that during the last office visit I had utilize silver nitrate of this area I was much less concerned about the situation. Subsequently I was able to clean off much of this tissue without any complication today. This does not appear to show any signs of infection and actually look somewhat better [name], [name] ([id number]) compared to last time post debridement. Her second toe on the right foot actually had callous over and there did appear still be some fluid underneath this that would require debridement today. 12/27/18 on evaluation today patient actually appears to be showing signs of improvement at all locations. Even the left lateral ankle although this is not quite as great as the other sites. Fortunately there does not appear to be any signs of infection at this time and both of her toes on the  right foot seem to be showing signs of improvement which is good news and very pleased in this regard. 01/03/19 on evaluation today patient appears to be doing better for the most part in regard to her wounds in particular. There does not appear to be any evidence of infection at this time which is good news. Fortunately there is no sign of really worsening anywhere except for the right great toe which she does have what appears to be a bruise/deep tissue injury which is very superficial and already resolving. I'm not sure where this came from I questioned her extensively and she does not recall what [name] have happened with this. Other than that the patient seems to be doing well even the left lateral ankle ulcer looks good and is getting smaller. 01/10/19 on evaluation today patient appears to be doing well in regard to her left heel wound and both of her toe wounds. Overall I feel like there is definitely improvement here and I'm happy in that regard. With that being said unfortunately she is having issues with the left lateral malleolus ulcer which unfortunately still has a lot of depth to it. This is gonna be a very difficult wound for [place] to be able to truly get to heal. I [name] want to consider some type of skin substitute to see if this would be of benefit for her. I'll discuss this with her more the next visit most likely. This was something I thought about more at the end of the visit when I was Artie out of the room and the patient had been discharged. 01/17/19 on evaluation today patient appears to be doing very well in regard to her wounds in general. She's been making excellent progress at this time. Fortunately there's no sign of infection at this time either. No fevers, chills, nausea, or vomiting noted at this time. The biggest issue is still her left lateral malleolus where it appears to be doing well and is getting smaller but still shows a small corner where this is deeper and goes down  into what appears to be the joint space. Nonetheless this is taking much longer to heal although it still looks better in smaller than previous evaluations. 01/24/19 on evaluation today patient's wounds actually appear to  be doing rather well in general overall. She did require some sharp debridement in regard to the right great toe but everything else appears to be doing excellent no debridement was even necessary. No fevers, chills, nausea, or vomiting noted at this time. 01/31/19 on evaluation today patient actually appears to be doing much better in regard to her left foot wound on the heel as well as the ankle. The right great toe appears to be a little bit worse today this had callous over and trapped a lot of fluid underneath. Fortunately there's no signs of infection at any site which is great news. 02/07/19 on evaluation today patient actually appears to be doing decently well in regard to all of her ulcers at this point. No sharp debridement was required she is a little bit of hyper granulation in regard to the left lateral ankle as well as the left heel but the hill itself is almost completely healed which is excellent news. Overall been very pleased in this regard. 02/14/19 on evaluation today patient actually appears to be doing very well in regard to her ulcers on the right first toe, left lateral malleolus, and left heel. In fact the heel is almost completely healed at this point. The patient does not show any signs of infection which is good news. Overall very pleased with how things have progressed. 04/18/19 Telehealth Evaluation During the COVID-19 National Emergency: Verbal Consent: Obtained from patient Allergies: reviewed and the active list is current. Medication changes: patient has no current medication changes. COVID-19 Screening: 1. Have you traveled internationally or on a cruise ship in the last 14 dayso No 2. Have you had contact with someone with or under investigation for  COVID-19o No 3. Have you had a fever, cough, sore throat, or experiencing shortness of breatho No on evaluation today actually did have a visit with this patient through a telehealth encounter with her home health nurse. Subsequently it was noted that the patient actually appears to be doing okay in regard to her wounds both the right great toe as well as the left lateral malleolus have shown signs of improvement although this in your theme around the left lateral malleolus there eschar coverings for both locations. The question is whether or not they are actually close and whether or not home health needs to discharge the patient or not. Nonetheless my concern is this point obviously is that without actually seeing her and being able to evaluate this directly I cannot ensure that she is completely healed which is the question that I'm being asked. 04/22/19 on evaluation today patient presents for her first evaluation since last time I saw her which was actually February 14, 2019. I did do a telehealth visit last week in which point it was questionable whether or not she [name] be healed and had to bring her in today for confirmation. With that being said she does seem to be doing quite well at this point which is good news. There does not appear to be any drainage in the deed I believe her wounds [name] be healed. Readmission: 09/04/2019 on evaluation today patient appears to be doing unfortunately somewhat more poorly in regard to her left foot ulcer secondary to a wound that began on 08/21/2019 at least when she first noticed this. Fortunately she has not had any evidence of active infection at this time. Systemically. I also do not necessarily see any evidence of infection at the blister/wound site on the first metatarsal head plantar aspect.  This almost appears to be something that [name] have just rubbed inappropriately causing this to breakdown. They did not want a wait too long to come in to be seen as  again she had significant issues in the past with wounds that took quite a while to heal in fact it was close to 2 years. Nonetheless this does not appear to be quite that bad but again we do need to remove some of the necrotic tissue from the surface of the wound to tell exactly the extent. She does not appear to have any significant arterial disease at this point and again her last ABIs and TBI's are recorded above in the alert section her left ABI was 1.27 with a TBI of 0.72 to the right ABI 1.08 with a TBI of 0.39. Other than this the patient has been doing quite well since I last saw her and that was in [name] 2020. 09/11/2019 on evaluation today patient appeared to be doing very well with regard to her plantar foot ulcer on the left. In fact this appears to be almost completely healed which is awesome. That is after just 1 week of intervention. With that being said there is no signs of active infection at this time. GENELL, CADWELL ([id number]) 09/18/2019 on evaluation today patient actually appears to be doing excellent in fact she is completely healed based on what I am seeing at this point. Fortunately there is no signs of active infection at this time and overall patient is very pleased to hear that this area has healed so quickly. Readmission: 05/13/2021 upon evaluation today this patient presents for reevaluation here in the clinic. This is a wound that actually we previously took care of. She had 1 on the right ankle and the left the left turned out to be be harder due to to heal but nonetheless is doing great at this point as the right that has reopened and it was noted first just several weeks ago with a scab over it and came off in just the past few days. Fortunately there does not appear to be any obvious evidence of significant active infection at this time which is great news. No fevers, chills, nausea, vomiting, or diarrhea. The patient does have a history of pacemaker along with  being on Eliquis currently as well. There does not appear to be any signs of this interfering in any way with her wound. She does have swelling we previously had compression socks for her ordered but again it does not look like she wears these on a regular basis by any means. 05/26/2021 upon evaluation today patient appears to be doing well with regard to her wound which is actually showing signs of excellent improvement. There does not appear to be any signs of active infection which is great news and overall very pleased with where things stand today. No fevers, chills, nausea, vomiting, or diarrhea. 06/02/2021 upon evaluation today patient's wound actually showing signs of excellent improvement. Fortunately there does not appear to be any signs of active infection which is great news. I think the patient is making good progress with regard to her wounds in general. 06/09/2021 upon evaluation today patient appears to be doing excellent in regard to her wounds currently. Fortunately there does not appear to be any signs of active infection which is great news. No fevers, chills, nausea, vomiting, or diarrhea. Overall extremely pleased with where things stand today. I think the patient is making excellent progress. 06/16/2021 upon evaluation today  patient appears to be doing well in regard to her wound. This is going require little bit of debridement today and that was discussed with the patient. Otherwise she seems to be doing quite well and I am actually very pleased with where things stand at this point. No fevers, chills, nausea, vomiting, or diarrhea. 06/23/2021 upon evaluation today patient appears to be doing well with regard to her wounds. She has been tolerating the dressing changes without complication. Fortunately there does not appear to be any evidence of infection and she has not had air in her home which she actually lives at an assisted living that got fixed this morning. With that being  said because of that her wrap has been extremely hot and bothersome for her over the past week. 06/30/2021 upon evaluation today patient is actually making excellent progress in regard to her ankle ulcer. She has been tolerating the dressing changes without complication and overall extremely pleased with where things stand there does not appear to be any evidence of active infection which is great news. No fevers, chills, nausea, vomiting, or diarrhea. 07/07/21 upon evaluation today patients and culture on the right actually appears to be doing quite well. There does not appear to be any signs of infection and overall very pleased with where things stand today. No fevers, chills, nausea, or vomiting noted at this time. 07/14/2021 unfortunately the patient today has some evidence of deep tissue injury and pressure getting to the ankle region. Again I am not exactly sure what is going on here but this is very similar to issues that we have had in the past. I explained to the patient that she needs to be very mindful of exactly what is happening I think sleeping in bed is probably the main issue here although there could be other culprits I am not sure what else would potentially lead to this kind of a problem for her. 07/21/2021 upon evaluation today patient's wound actually showing signs of improvement compared to last week. Fortunately there does not appear to be any signs of active infection which is great news and overall very pleased with where things stand in that regard. With that being said I do believe that she is continuing to show signs of overall of getting better although I think this is still basically about what we were 2 weeks ago due to the worsening and now improvement. 07/28/2021 upon evaluation today patient appears to be doing well with regard to her wound. She does have some slough buildup on the surface of the wound which I would have to manage today. Fortunately there is no sign of  active infection at this time. No fevers, chills, nausea, vomiting, or diarrhea. 08/04/2021 upon evaluation today patient appears to be doing about the same in regard to her wound. To be perfectly honest I am beginning to be a little bit concerned about the overall appearance of the wound bed. I do think possibly taking a sample right around the margin of the wound could be beneficial for her as far as identifying anything such as an inflammatory process or to be honest even a skin tag cancer type process that [name] be of concern here. Fortunately there does not appear to be any evidence of active infection at this time which is great news she is not having any pain also great news. 08/11/2021 upon evaluation today patient appears to be doing well with regard to her wound. The good news is I did review her  biopsy results and it showed some inflammatory mixed findings but nothing that appeared to be malignant which is great news. Overall this is more of a chronic venous stasis type issue which again is more what we have been treating. Nonetheless I just wanted to make sure before going forward that there was not anything more untoward going on at this point. Electronic Signature(s) Signed: 08/11/2021 11:38:02 AM By: Worthy Keeler PA-C Entered By: Worthy Keeler on 08/11/2021 11:38:02 [name], [name] ([id number]) -------------------------------------------------------------------------------- Physical Exam Details Patient Name: [name] Date of Service: 08/11/2021 10:15 AM Medical Record Number: [id number] Patient Account Number: [id number] Date of Birth/Sex: [name] (87 y.o. F) Treating RN: Carlene Coria Primary Care Provider: Adrian Prows Other Clinician: Referring Provider: Adrian Prows Treating Provider/Extender: Skipper Cliche in Treatment: 49 Constitutional Well-nourished and well-hydrated in no acute distress. Respiratory normal breathing without  difficulty. Psychiatric this patient is able to make decisions and demonstrates good insight into disease process. Alert and Oriented x 3. pleasant and cooperative. Notes Upon inspection patient's wound bed actually showed signs of good granulation epithelization at this point. Fortunately there does not appear to be any evidence of infection which is great news and overall I am extremely pleased with where things stand today. No fevers, chills, nausea, vomiting, or diarrhea. I did perform debridement to clear away some of the necrotic debris. With that being said postdebridement I did obtain a wound culture from the clean wound base and we will see what this shows. I am also going to see about getting started on the antibiotic therapy here. Electronic Signature(s) Signed: 08/11/2021 11:38:35 AM By: Worthy Keeler PA-C Entered By: Worthy Keeler on 08/11/2021 11:38:35 [name], [name] ([id number]) -------------------------------------------------------------------------------- Physician Orders Details Patient Name: [name] Date of Service: 08/11/2021 10:15 AM Medical Record Number: [id number] Patient Account Number: [id number] Date of Birth/Sex: [name] (87 y.o. F) Treating RN: Carlene Coria Primary Care Provider: Adrian Prows Other Clinician: Referring Provider: Adrian Prows Treating Provider/Extender: Skipper Cliche in Treatment: 12 Verbal / Phone Orders: No Diagnosis Coding ICD-10 Coding Code Description 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, stage 3 I89.0 Lymphedema, not elsewhere classified I87.2 Venous insufficiency (chronic) (peripheral) Z95.0 Presence of cardiac pacemaker Z79.01 Long term (current) use of anticoagulants Follow-up Appointments o Return Appointment in 1 week. Bathing/ Shower/ Hygiene o [name] shower with wound dressing protected with water repellent cover or cast protector. Edema Control - Lymphedema / Segmental Compressive Device  / Other o Optional: One layer of unna paste to top of compression wrap (to act as an anchor). - and at toes o Elevate, Exercise Daily and Avoid Standing for Long Periods of Time. o Elevate legs to the level of the heart and pump ankles as often as possible o Elevate leg(s) parallel to the floor when sitting. Wound Treatment Wound #7 - Malleolus Wound Laterality: Right, Lateral Cleanser: Soap and Water 1 x Per Week/30 Days Discharge Instructions: Gently cleanse wound with antibacterial soap, rinse and pat dry prior to dressing wounds Primary Dressing: Hydrofera Blue Ready Transfer Foam, 2.5x2.5 (in/in) 1 x Per Week/30 Days Discharge Instructions: Apply Hydrofera Blue Ready to wound bed as directed Secondary Dressing: ABD Pad 5x9 (in/in) 1 x Per Week/30 Days Discharge Instructions: Cover with ABD pad Compression Wrap: Profore Lite LF 3 Multilayer Compression Bandaging System 1 x Per Week/30 Days Discharge Instructions: Apply 3 multi-layer wrap as prescribed. Laboratory o Bacteria identified in Wound by Culture (MICRO) - non healing wound with increased pain - (  ICD10 L89.513 - Pressure ulcer of right ankle, stage 3) oooo LOINC Code: [id number] oooo Convenience Name: Wound culture routine Patient Medications Allergies: Sulfa (Sulfonamide Antibiotics), metronidazole, monistat Notifications Medication Indication Start End doxycycline hyclate 08/15/2021 DOSE 1 - oral 100 mg capsule - 1 capsule oral taken 2 times per day for 14 days [name], [name] ([id number]) Electronic Signature(s) Signed: 08/15/2021 5:21:45 PM By: Worthy Keeler PA-C Previous Signature: 08/12/2021 11:43:44 AM Version By: Worthy Keeler PA-C Entered By: Worthy Keeler on 08/15/2021 17:21:45 [name], [name] ([id number]) -------------------------------------------------------------------------------- Problem List Details Patient Name: [name] Date of Service: 08/11/2021 10:15 AM Medical Record Number:  [id number] Patient Account Number: [id number] Date of Birth/Sex: [name] (87 y.o. F) Treating RN: Carlene Coria Primary Care Provider: Adrian Prows Other Clinician: Referring Provider: Adrian Prows Treating Provider/Extender: Skipper Cliche in Treatment: 12 Active Problems ICD-10 Encounter Code Description Active Date MDM Diagnosis 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, stage 3 05/13/2021 No Yes I89.0 Lymphedema, not elsewhere classified 05/13/2021 No Yes I87.2 Venous insufficiency (chronic) (peripheral) 05/13/2021 No Yes Z95.0 Presence of cardiac pacemaker 05/13/2021 No Yes Z79.01 Long term (current) use of anticoagulants 05/13/2021 No Yes Inactive Problems Resolved Problems Electronic Signature(s) Signed: 08/11/2021 10:26:36 AM By: Worthy Keeler PA-C Entered By: Worthy Keeler on 08/11/2021 10:26:36 [name], [name] ([id number]) -------------------------------------------------------------------------------- Progress Note Details Patient Name: [name] Date of Service: 08/11/2021 10:15 AM Medical Record Number: [id number] Patient Account Number: [id number] Date of Birth/Sex: [name] (87 y.o. 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 Dr. Amalia Hailey the podiatrist saw her on 07/20/2017 and sent her to the wound center for possible hyperbaric oxygen therapy. past medical history of peripheral vascular disease, varicose veins, status post appendectomy, basal cell carcinoma excision from the left  leg, cholecystectomy, pacemaker placement, right lower extremity angiography done by Dr. dew in March 2017 with placement of a stent. there is also note of a successful ablation of the right small saphenous vein done which was reviewed by ultrasound on 10/24/2016. the patient had a right small saphenous vein ablation done on 10/20/2016. The patient has never been a smoker. She has been seen by Dr. Corene Cornea dew the vascular surgeon who most recently saw her on 06/15/2017 for evaluation of ongoing problems with right leg swelling. She had a lower extremity arterial duplex examination done(02/13/17) which showed patent distal right superficial femoral artery stent and above-the-knee popliteal stent without evidence of restenosis. The ABI was more than 1.3 on the right and more than 1.3 on the left. This was consistent with noncompressible arteries due to medial calcification. The right great toe pressure and PPG waveforms are within normal limits and the left great toe pressure and PPG waveforms are decreased. he recommended she continue to wear her compression stockings and continue with elevation. She is scheduled to have a noninvasive arterial study in the near future 08/16/2017 -- had a lower extremity arterial duplex examination done which showed patent distal right superficial femoral artery stent and above-the-knee popliteal stent without evidence of restenosis. The ABI was more than 1.3 on the right and more than 1.3 on the left. This was  consistent with noncompressible arteries due to medial calcification. The right great toe pressure and PPG waveforms are within normal limits and the left great toe pressure and PPG waveforms are decreased. the x-ray of the right ankle has not yet been done 08/24/2017 -- had a right ankle x-ray -- IMPRESSION:1. No fracture, bone lesion or evidence of osteomyelitis. 2. Lateral soft tissue swelling with a soft tissue ulcer. she has not yet seen the vascular surgeon  for review 08/31/17 on evaluation today patient's wound appears to be showing signs of improvement. She still with her appointment with vascular in order to review her results of her vascular study and then determine if any intervention would be recommended at that time. No fevers, chills, nausea, or vomiting noted at this time. She has been tolerating the dressing changes without complication. 09/28/17 on evaluation today patient's wound appears to show signs of good improvement in regard to the granulation tissue which is surfacing. There is still a layer of slough covering the wound and the posterior portion is still significantly deeper than the anterior nonetheless there has been some good sign of things moving towards the better. She is going to go back to Dr. dew for reevaluation to ensure her blood flow is still appropriate. That will be before her next evaluation with [place] next week. No fevers, chills, nausea, or vomiting noted at this time. Patient does have some discomfort rated to be a 3-4/10 depending on activity specifically cleansing the wound makes it worse. 10/05/2017 -- the patient was seen by Dr. Lucky Cowboy last week and noninvasive studies showed a normal right ABI with brisk triphasic waveforms consistent with no arterial insufficiency including normal digital pressures. The duplex showed a patent distal right SFA stent and the proximal SFA was also normal. He was pleased with her test and thought she should have enough of perfusion for normal wound healing. He would see her back in 6 months time. 12/21/17 on evaluation today patient appears to be doing fairly well in regard to her right lateral ankle wound. Unfortunately the main issue that she is expansion at this point is that she is having some issues with what appears to be some cellulitis in the right anterior shin. She has also been noting a little bit of uncomfortable feeling especially last night and her ankle area. I'm afraid that  she made the developing a little bit of an infection. With that being said I think it is in the early stages. 12/28/17 on evaluation today patient's ankle appears to be doing excellent. She's making good progress at this point the cellulitis seems to have improved after last week's evaluation. Overall she is having no significant discomfort which is excellent news. She does have an appointment with Dr. dew on March 29, 2018 for reevaluation in regard to the stent he placed. She seems to have excellent blood flow in the right lower extremity. 01/19/12 on evaluation today patient's wound appears to be doing very well. In fact she does not appear to require debridement at this point, there's no evidence of infection, and overall from the standpoint of the wound she seems to be doing very well. With that being said I believe that it [name] be time to switch to different dressing away from the [facility] she tells me she does have a lot going on her friend actually passed away yesterday and she's also having a lot of issues with her husband this obviously is weighing heavy on her as far as  your thoughts and concerns today. 01/25/18 on evaluation today patient appears to be doing fairly well in regard to her right lateral malleolus. She has been tolerating the dressing changes without complication. Overall I feel like this is definitely showing signs of improvement as far as how the overall appearance of the wound is there's also evidence of epithelium start to migrate over the granulation tissue. In general I think that she is progressing nicely as far as the wound is concerned. The only concern she really has is whether or not we can switch to every other week visits in order to avoid having as many KAYLAND, YEUNG ([id number]) appointments as her daughters have a difficult time getting her to her appointments as well as the patient's husband to his he is not doing very well at this  point. 02/22/18 on evaluation today patient's right lateral malleolus ulcer appears to be doing great. She has been tolerating the dressing changes without complication. Overall you making excellent progress at this time. Patient is having no significant discomfort. 03/15/18 on evaluation today patient appears to be doing much more poorly in regard to her right lateral ankle ulcer at this point. Unfortunately since have last seen her her husband has passed just a few days ago is obviously weighed heavily on her her daughter also had surgery well she is with her today as usual. There does not appear to be any evidence of infection she does seem to have significant contusion/deep tissue injury to the right lateral malleolus which was not noted previous when I saw her last. It's hard to tell of exactly when this injury occurred although during the time she was spending the night in the hospital this [name] have been most likely. 03/22/18 on evaluation today patient appears to actually be doing very well in regard to her ulcer. She did unfortunately have a setback which was noted last week however the good news is we seem to be getting back on track and in fact the wound in the core did still have some necrotic tissue which will be addressed at this point today but in general I'm seeing signs that things are on the up and up. She is glad to hear this obviously she's been somewhat concerned that due to the how her wound digressed more recently. 03/29/18 on evaluation today patient appears to be doing fairly well in regard to her right lower extremity lateral malleolus ulcer. She unfortunately does have a new area of pressure injury over the inferior portion where the wound has opened up a little bit larger secondary to the pressure she seems to be getting. She does tell me sometimes when she sleeps at night that it actually hurts and does seem to be pushing on the area little bit more unfortunately. There does  not appear to be any evidence of infection which is good news. She has been tolerating the dressing changes without complication. She also did have some bruising in the left second and third toes due to the fact that she [name] have bump this or injured it although she has neuropathy so she does not feel she did move recently that [name] have been where this came from. Nonetheless there does not appear to be any evidence of infection at this time. 04/12/18 on evaluation today patient's wound on the right lateral ankle actually appears to be doing a little bit better with a lot of necrotic docking tissue centrally loosening up in clearing away. However she does have the  beginnings of a deep tissue injury on the left lateral malleolus likely due to the fact we've been trying offload the right as much as we have. I think she [name] benefit from an assistive soft device to help with offloading and it looks like they're looking at one of the doughnut conditions that wraps around the lower leg to offload which I think will definitely do a good job. With that being said I think we definitely need to address this issue on the left before it becomes a wound. Patient is not having significant pain. 04/19/18 on evaluation today patient appears to be doing excellent in regard to the progress she's made with her right lateral ankle ulcer. The left ankle region which did show evidence of a deep tissue injury seems to be resolving there's little fluid noted underneath and a blister there's nothing open at this point in time overall I feel like this is progressing nicely which is good news. She does not seem to be having significant discomfort at this point which is also good news. 04/25/18-She is here in follow up evaluation for bilateral lateral malleolar ulcers. The right lateral malleolus ulcer with pale subcutaneous tissue exposure, central area of ulcer with tendon/periosteum exposed. The left lateral malleolus ulcer now  with central area of nonviable tissue, otherwise deep tissue injury. She is wearing compression wraps to the left lower extremity, she will place the right lower extremity compression wraps on when she gets home. She will be out of town over the weekend and return next week and follow-up appointment. She completed her doxycycline this morning 05/03/18 on evaluation today patient appears to be doing very well in regard to her right lateral ankle ulcer in general. At least she's showing some signs of improvement in this regard. Unfortunately she has some additional injury to the left lateral malleolus region which appears to be new likely even over the past several days. Again this determination is based on the overall appearance. With that being said the patient is obviously frustrated about this currently. 05/10/18-She is here in follow-up evaluation for bilateral lateral malleolar ulcers. She states she has purchased offloading shoes/boots and they will arrive tomorrow. She was asked to bring them in the office at next week's appointment so her provider is aware of product being utilized. She continues to sleep on right or left side, she has been encouraged to sleep on her back. The right lateral malleolus ulcer is precariously close to peri-osteum; will order xray. The left lateral malleolus ulcer is improved. Will switch back to santyl; she will follow up next week. 05/17/18 on evaluation today patient actually appears to be doing very well in regard to her malleolus her ulcers compared to last time I saw them. She does not seem to have as much in the way of contusion at this point which is great news. With that being said she does continue to have discomfort and I do believe that she is still continuing to benefit from the offloading/pressure reducing boots that were recommended. I think this is the key to trying to get this to heal up completely. 05/24/18 on evaluation today patient actually appears  to be doing worse at this point in time unfortunately compared to her last week's evaluation. She is having really no increased pain which is good news unfortunately she does have more maceration in your theme and noted surrounding the right lateral ankle the left lateral ankle is not really is erythematous I do not see signs  of the overt cellulitis on that side. Unfortunately the wounds do not seem to have shown any signs of improvement since the last evaluation. She also has significant swelling especially on the right compared to previous some of this [name] be due to infection however also think that she [name] be served better while she has these wounds by compression wrapping versus continuing to use the Juxta-Lite for the time being. Especially with the amount of drainage that she is experiencing at this point. No fevers, chills, nausea, or vomiting noted at this time. 05/31/18 on evaluation today patient appears to actually be doing better in regard to her right lateral lower extremity ulcer specifically on the malleolus region. She has been tolerating the antibiotic without complication. With that being said she still continues to have issues but a little bit of redness although nothing like she what she was experiencing previous. She still continues to pressure to her ankle area she did get the problem on offloading boots unfortunately she will not wear them she states there too uncomfortable and she can't get in and out of the bed. Nonetheless at this point her wounds seem to be continually getting worse which is not what we want I'm getting somewhat concerned about her progress and how things are going to proceed if we do not intervene in some way shape or form. I therefore had a very lengthy conversation today about offloading yet again and even made a specific suggestion for switching her to a memory foam mattress and even gave the information for a specific one that they could look at getting  if it was something that they were interested in considering. She does not want to be considered for a hospital bed air mattress although honestly insurance would not cover it that she does not have any wounds on her trunk. 06/14/18 on evaluation today both wounds over the bilateral lateral malleolus her ulcers appear to be doing better there's no evidence of pressure injury at this point. She did get the foam mattress for her bed and this does seem to have been extremely beneficial for her in my pinion. Her daughter states that she is having difficulty getting out of bed because of how soft it is. The patient also relates this to be. Nonetheless I do feel like she's actually doing better. Unfortunately right after and around the time she was getting the mattress she also sustained a fall when she got up to go pick up the phone and ended up injuring her right elbow she has 18 sut[address] place. We are not caring for this currently although home health is going to be taking the sutures out shortly. Nonetheless this [name] be something that we need to evaluate going forward. It depends on [name], [name] ([id number]) how well it has or has not healed in the end. She also recently saw an orthopedic specialist for an injection in the right shoulder just before her fall unfortunately the fall seems to have worsened her pain. 06/21/18 on evaluation today patient appears to be doing about the same in regard to her lateral malleolus ulcers. Both appear to be just a little bit deeper but again we are clinging away the necrotic and dead tissue which I think is why this is progressing towards a deeper realm as opposed to improving from my measurement standpoint in that regard. Nonetheless she has been tolerating the dressing changes she absolutely hates the memory foam mattress topper that was obtained for her nonetheless I  do believe this is still doing excellent as far as taking care of excess pressure in regard  to the lateral malleolus regions. She in fact has no pressure injury that I see whereas in weeks past it was week by week I was constantly seeing new pressure injuries. Overall I think it has been very beneficial for her. 07/03/18; patient arrives in my clinic today. She has deep punched out areas over her bilateral lateral malleoli. The area on the right has some more depth. We spent a lot of time today talking about pressure relief for these areas. This started when her daughter asked for a prescription for a memory foam mattress. I have never written a prescription for a mattress and I don't think insurances would pay for that on an ordinary bed. In any case he came up that she has foam boots that she refuses to wear. I would suggest going to these before any other offloading issues when she is in bed. They say she is meticulous about offloading this the rest of the day 07/10/18- She is seen in follow-up evaluation for bilateral, lateral malleolus ulcers. There is no improvement in the ulcers. She has purchased and is sleeping on a memory foam mattress/overlay, she has been using the offloading boots nightly over the past week. She has a follow up appointment with vascular medicine at the end of October, in my opinion this follow up should be expedited given her deterioration and suboptimal TBI results. We will order plain film xray of the left ankle as deeper structures are palpable; would consider having MRI, regardless of xray report(s). The ulcers will be treated with iodoflex/iodosorb, she is unable to safely change the dressings daily with santyl. 07/19/18 on evaluation today patient appears to be doing in general visually well in regard to her bilateral lateral malleolus ulcers. She has been tolerating the dressing changes without complication which is good news. With that being said we did have an x-ray performed on 07/12/18 which revealed a slight loosen see in the lateral portion of the distal  left fibula which [name] represent artifact but underline lytic destruction or osteomyelitis could not be excluded. MRI was recommended. With that being said we can see about getting the patient scheduled for an MRI to further evaluate this area. In fact we have that scheduled currently for August 20 19,019. 07/26/18 on evaluation today patient's wound on the right lateral ankle actually appears to be doing fairly well at this point in my pinion. She has made some good progress currently. With that being said unfortunately in regard to the left lateral ankle ulcer this seems to be a little bit more problematic at this time. In fact as I further evaluated the situation she actually had bone exposed which is the first time that's been the case in the bone appear to be necrotic. Currently I did review patient's note from Dr. Bunnie Domino office with [facility]. He stated that ABI was 1.26 on the right and 0.95 on the left with good waveforms. Her perfusion is stable not reduced from previous studies and her digital waveforms were pretty good particularly on the right. His conclusion upon review of the note was that there was not much she could do to improve her perfusion and he felt she was adequate for wound healing. His suggestion was that she continued to see [place] and consider a synthetic skin graft if there was no underlying infection. He plans to see her back in six  months or as needed. 08/01/18 on evaluation today patient appears to be doing better in regard to her right lateral ankle ulcer. Her left lateral ankle ulcer is about the same she still has bone involvement in evidence of necrosis. There does not appear to be evidence of infection at this time On the right lateral lower extremity. I have started her on the Augmentin she picked this up and started this yesterday. This is to get her through until she sees infectious disease which is scheduled for 08/12/18. 08/06/18 on evaluation  today patient appears to be doing rather well considering my discussion with patient's daughter at the end of last week. The area which was marked where she had erythema seems to be improved and this is good news. With that being said overall the patient seems to be making good improvement when it comes to the overall appearance of the right lateral ankle ulcer although this has been slow she at least is coming around in this regard. Unfortunately in regard to the left lateral ankle ulcer this is osteomyelitis based on the pathology report as well is bone culture. Nonetheless we are still waiting CT scan. Unfortunately the MRI we originally ordered cannot be performed as the patient is a pacemaker which I had overlooked. Nonetheless we are working on the CT scan approval and scheduling as of now. She did go to the hospital over the weekend and was placed on IV Cefzo for a couple of days. Fortunately this seems to have improved the erythema quite significantly which is good news. There does not appear to be any evidence of worsening infection at this time. She did have some bleeding after the last debridement therefore I did not perform any sharp debridement in regard to left lateral ankle at this point. Patient has been approved for a snap vac for the right lateral ankle. 08/14/18; the patient with wounds over her bilateral lateral malleoli. The area on the right actually looks quite good. Been using a snap back on this area. Healthy granulation and appears to be filling in. Unfortunately the area on the left is really problematic. 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 Also noted to have cellulitis. She saw Dr. Novella Olive of infectious disease today and was put on linezolid. We are able to verify this with her pharmacy. She is completed the Augmentin that she was already on. We've been using Iodoflex to this area 08/23/18  on evaluation today patient's wounds both actually appear to be doing better compared to my prior evaluations. Fortunately she showing signs of good improvement in regard to the overall wound status especially where were using the snap vac on the right. In regard to left lateral malleolus the wound bed actually appears to be much cleaner than previously noted. I do not feel any phone directly probed during evaluation today and though there is tendon noted this does not appear to be necrotic it's actually fairly good as far as the overall appearance of the tendon is concerned. In general the wound bed actually appears to be doing significantly better than it was previous. Patient is currently in the care of [name] and I did review that note today. He actually has her on two weeks of linezolid and then following the patient will be on 1-2 months of Keflex. That is the plan currently. She has been on antibiotics therapy as prescribed by myself initially starting on  July 30, 2018 and has been on that continuously up to this point. 08/30/18 on evaluation today patient actually appears to be doing much better in regard to her right lateral malleolus ulcer. She has been tolerating the dressing changes specifically the snap vac without complication although she did have some issues with the seal currently. Apparently there was some trouble with getting it to maintain over the past week past Sunday. Nonetheless overall the wound appears better in regard to the right lateral malleolus region. In regard to left lateral malleolus this actually show some signs of additional granulation although there still tendon noted in the base of the wound this appears to be healthy not necrotic in any way whatsoever. We are considering potentially using a snap vac for the left lateral malleolus as well the product wrap from [employer], [place], was present in the clinic today we're going to see this patient I did have her come in  with me after obtaining consent from the patient and her daughter in order to look at the wound and see if there's any recommendation one way or another as to whether or not they felt the snapback could be beneficial for the left lateral malleolus region. But [name], [name] ([id number]) the conclusion was that it might be but that this is definitely a little bit deeper wound than what traditionally would be utilized for a snap vac. 09/06/18 on evaluation today patient actually appears to be doing excellent in my pinion in regard to both ankle ulcers. She has been tolerating the dressing changes without complication which is great news. Specifically we have been using the snap vac. In regard to the right ankle I'm not even sure that this is going to be necessary for today and following as the wound has filled in quite nicely. In regard to the left ankle I do believe that we're seeing excellent epithelialization from the edge as well as granulation in the central portion the tendon is still exposed but there's no evidence of necrotic bone and in general I feel like the patient has made excellent progress even compared to last week with just one week of the snap vac. 09/11/18; this is a patient who has wounds on her bilateral lateral malleoli. Initially both of these were deep stage IV wounds in the setting of chronic arterial insufficiency. She has been revascularized. As I understand think she been using snap vacs to both of these wounds however the area on the right became more superficial and currently she is only using it on the left. Using silver collagen on the right and silver collagen under the back on the left I believe 09/19/18 on evaluation today patient actually appears to be doing very well in regard to her lateral malleolus or ulcers bilaterally. She has been tolerating the dressing changes without complication. Fortunately there does not appear to be any evidence of infection at this  time. Overall I feel like she is improving in an excellent manner and I'm very pleased with the fact that everything seems to be turning towards the better for her. This has obviously been a long road. 09/27/18 on evaluation today patient actually appears to be doing very well in regard to her bilateral lateral malleolus ulcers. She has been tolerating the dressing changes without complication. Fortunately there does not appear to be any evidence of infection at this time which is also great news. No fevers, chills, nausea, or vomiting noted at this time. Overall I feel like she is  doing excellent with the snap vac on the left malleolus. She had 40 mL of fluid collection over the past week. 10/04/18 on evaluation today patient actually appears to be doing well in regard to her bilateral lateral malleolus ulcers. She continues to tolerate the dressing changes without complication. One issue that I see is the snap vac on the left lateral malleolus which appears to have sealed off some fluid underlying this area and has not really allowed it to heal to the degree that I would like to see. For that reason I did suggest at this point we [name] want to pack a small piece of packing strip into this region to allow it to more effectively wick out fluid. 10/11/18 in general the patient today does not feel that she has been doing very well. She's been a little bit lethargic and subsequently is having bodyaches as well according to what she tells me today. With that being said overall she has been concerned with the fact that something [name] be worsening although to be honest her wounds really have not been appearing poorly. She does have a new ulcer on her left heel unfortunately. This [name] be pressure related. Nonetheless it seems to me to have potentially started at least as a blister I do not see any evidence of deep tissue injury. In regard to the left ankle the snap vac still seems to be causing the ceiling off of  the deeper part of the wound which is in turn trapping fluid. I'm not extremely pleased with the overall appearance as far as progress from last week to this week therefore I'm gonna discontinue the snap vac at this point. 10/18/18 patient unfortunately this point has not been feeling well for the past several days. She was seen by Grayland Ormond her primary care provider who is a Librarian, academic at [facility]. Subsequently she states that she's been very weak and generally feeling malaise. No fevers, chills, nausea, or vomiting noted at this time. With that being said bloodwork was performed at the PCP office on the 11th of this month which showed a white blood cell count of 10.7. This was repeated today and shows a white blood cell count of 12.4. This does show signs of worsening. Coupled with the fact that she is feeling worse and that her left ankle wound is not really showing signs of improvement I feel like this is an indication that the osteomyelitis is likely exacerbating not improving. Overall I think we [name] also want to check her C-reactive protein and sedimentation rate. Actually did call [facility] office this afternoon while the patient was in the office here with me. Subsequently based on the findings we discussed treatment possibilities and I think that it is appropriate for [place] to go ahead and initiate treatment with doxycycline which I'm going to do. Subsequently he did agree to see about adding a CRP and sedimentation rate to her orders. If that has not already been drawn to where they can run it they will contact the patient she can come back to have that check. They are in agreement with plan as far as the patient and her daughter are concerned. Nonetheless also think we need to get in touch with Dr. Henreitta Leber office to see about getting the patient scheduled with him as soon as possible. 11/08/18 on evaluation today patient presents for follow-up concerning her  bilateral foot and ankle ulcers. I did do an extensive review of her chart in epic today.  Subsequently she was seen by [name] he did initiate Cefepime IV antibiotic therapy. Subsequently she had some issues with her PICC line this had to be removed because it was coiled and then replaced. Fortunately that was now settled. Unfortunately she has continued have issues with her left heel as well as the issues that she is experiencing with her bilateral lateral malleolus regions. I do believe however both areas seem to be doing a little bit better on evaluation today which is good news. No fevers, chills, nausea, or vomiting noted at this time. She actually has an angiogram schedule with Dr. dew on this coming Monday, November 11, 2018. Subsequently the patient states that she is feeling much better especially than what she was roughly 2 weeks ago. She actually had to cancel an appointment because she was feeling so poorly. No fevers, chills, nausea, or vomiting noted at this time. 11/15/18 on evaluation today patient actually is status post having had her angiogram with Dr. dew Monday, four days ago. 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 He had to go and work on several areas of the vasculature fortunately he was able to obtain no more than a 30% residual stenosis throughout post procedure. I reviewed this note today. I think this will definitely help with healing at this time. Fortunately there does not appear to be any signs of infection and I do feel like ratio already has a better appearance to it. 11/22/18 upon evaluation today patient actually appears to be doing very well in regard to her wounds in general. The right lateral malleolus looks excellent the heel looks better in the left lateral malleolus also appears to be doing a little better. With that being said the right second toe actually appears to be open and training we been watching this is been dry and  stable but now is open. 12/03/2018 Seen today for follow-up and management of multiple bilateral lower extremity wounds. New pressure injury of the great toe which is closed at this time. Wound of the right distal second toe appears larger today with deep undermining and a pocket of fluid present within the undermining region. Left and right malleolus is wounds are stable today with no signs and symptoms of infection.Denies any needs or concerns during exam today. 12/13/18 on evaluation today patient appears to be doing somewhat better in regard to her left heel ulcer. She also seems to be completely healed in regard to the right lateral malleolus ulcer. The left malleolus ulcer is smaller what unfortunately the wounds which are new over the first and second toes of the right foot are what are most concerning at this point especially the second. Both areas did require sharp debridement today. 12/20/18 on evaluation today patient's wound actually appears to be doing better in regard to left lateral ankle and her right lateral ankle continues to remain healed. The hill ulcer on the left is improved. She does have improvement noted as well in regard to both toe ulcers. Overall I'm very pleased in this regard. No fevers, chills, nausea, or vomiting noted at this time. JAHZARA, FINNELL ([id number]) 12/23/18 on evaluation today patient is seen after she had her toenails trimmed at the podiatrist office due to issues with her right great toe. There was what appeared to be dark eschar on the surface of the wound which had her in the podiatrist concerned. Nonetheless as I remember that during the last office visit I had  utilize silver nitrate of this area I was much less concerned about the situation. Subsequently I was able to clean off much of this tissue without any complication today. This does not appear to show any signs of infection and actually look somewhat better compared to last time post  debridement. Her second toe on the right foot actually had callous over and there did appear still be some fluid underneath this that would require debridement today. 12/27/18 on evaluation today patient actually appears to be showing signs of improvement at all locations. Even the left lateral ankle although this is not quite as great as the other sites. Fortunately there does not appear to be any signs of infection at this time and both of her toes on the right foot seem to be showing signs of improvement which is good news and very pleased in this regard. 01/03/19 on evaluation today patient appears to be doing better for the most part in regard to her wounds in particular. There does not appear to be any evidence of infection at this time which is good news. Fortunately there is no sign of really worsening anywhere except for the right great toe which she does have what appears to be a bruise/deep tissue injury which is very superficial and already resolving. I'm not sure where this came from I questioned her extensively and she does not recall what [name] have happened with this. Other than that the patient seems to be doing well even the left lateral ankle ulcer looks good and is getting smaller. 01/10/19 on evaluation today patient appears to be doing well in regard to her left heel wound and both of her toe wounds. Overall I feel like there is definitely improvement here and I'm happy in that regard. With that being said unfortunately she is having issues with the left lateral malleolus ulcer which unfortunately still has a lot of depth to it. This is gonna be a very difficult wound for [place] to be able to truly get to heal. I [name] want to consider some type of skin substitute to see if this would be of benefit for her. I'll discuss this with her more the next visit most likely. This was something I thought about more at the end of the visit when I was Artie out of the room and the patient had been  discharged. 01/17/19 on evaluation today patient appears to be doing very well in regard to her wounds in general. She's been making excellent progress at this time. Fortunately there's no sign of infection at this time either. No fevers, chills, nausea, or vomiting noted at this time. The biggest issue is still her left lateral malleolus where it appears to be doing well and is getting smaller but still shows a small corner where this is deeper and goes down into what appears to be the joint space. Nonetheless this is taking much longer to heal although it still looks better in smaller than previous evaluations. 01/24/19 on evaluation today patient's wounds actually appear to be doing rather well in general overall. She did require some sharp debridement in regard to the right great toe but everything else appears to be doing excellent no debridement was even necessary. No fevers, chills, nausea, or vomiting noted at this time. 01/31/19 on evaluation today patient actually appears to be doing much better in regard to her left foot wound on the heel as well as the ankle. The right great toe appears to be a little bit worse  today this had callous over and trapped a lot of fluid underneath. Fortunately there's no signs of infection at any site which is great news. 02/07/19 on evaluation today patient actually appears to be doing decently well in regard to all of her ulcers at this point. No sharp debridement was required she is a little bit of hyper granulation in regard to the left lateral ankle as well as the left heel but the hill itself is almost completely healed which is excellent news. Overall been very pleased in this regard. 02/14/19 on evaluation today patient actually appears to be doing very well in regard to her ulcers on the right first toe, left lateral malleolus, and left heel. In fact the heel is almost completely healed at this point. The patient does not show any signs of infection which  is good news. Overall very pleased with how things have progressed. 04/18/19 Telehealth Evaluation During the COVID-19 National Emergency: Verbal Consent: Obtained from patient Allergies: reviewed and the active list is current. Medication changes: patient has no current medication changes. COVID-19 Screening: 1. Have you traveled internationally or on a cruise ship in the last 14 dayso No 2. Have you had contact with someone with or under investigation for COVID-19o No 3. Have you had a fever, cough, sore throat, or experiencing shortness of breatho No on evaluation today actually did have a visit with this patient through a telehealth encounter with her home health nurse. Subsequently it was noted that the patient actually appears to be doing okay in regard to her wounds both the right great toe as well as the left lateral malleolus have shown signs of improvement although this in your theme around the left lateral malleolus there eschar coverings for both locations. The question is whether or not they are actually close and whether or not home health needs to discharge the patient or not. Nonetheless my concern is this point obviously is that without actually seeing her and being able to evaluate this directly I cannot ensure that she is completely healed which is the question that I'm being asked. 04/22/19 on evaluation today patient presents for her first evaluation since last time I saw her which was actually February 14, 2019. I did do a telehealth visit last week in which point it was questionable whether or not she [name] be healed and had to bring her in today for confirmation. With that being said she does seem to be doing quite well at this point which is good news. There does not appear to be any drainage in the deed I believe her wounds [name] be healed. Readmission: 09/04/2019 on evaluation today patient appears to be doing unfortunately somewhat more poorly in regard to her left foot ulcer  secondary to a wound that began on 08/21/2019 at least when she first noticed this. Fortunately she has not had any evidence of active infection at this time. Systemically. I also do not necessarily see any evidence of infection at the blister/wound site on the first metatarsal head plantar aspect. This almost appears to be something that [name] have just rubbed inappropriately causing this to breakdown. They did not want a wait too long to come in to be seen as again she had significant issues in the past with wounds that took quite a while to heal in fact it was close to 2 years. Nonetheless this does not appear to be quite that bad but again we do need to remove some of the necrotic tissue from the  surface of the wound to tell exactly the extent. She does not appear to have any significant arterial disease at this point and again her last ABIs and TBI's are recorded above in the alert section her left ABI was 1.27 with a TBI of 0.72 to the right ABI 1.08 with a TBI of 0.39. Other than this the patient has been doing quite ISMENIA, BENAVIDES ([id number]) well since I last saw her and that was in [name] 2020. 09/11/2019 on evaluation today patient appeared to be doing very well with regard to her plantar foot ulcer on the left. In fact this appears to be almost completely healed which is awesome. That is after just 1 week of intervention. With that being said there is no signs of active infection at this time. 09/18/2019 on evaluation today patient actually appears to be doing excellent in fact she is completely healed based on what I am seeing at this point. Fortunately there is no signs of active infection at this time and overall patient is very pleased to hear that this area has healed so quickly. Readmission: 05/13/2021 upon evaluation today this patient presents for reevaluation here in the clinic. This is a wound that actually we previously took care of. She had 1 on the right ankle and the left the  left turned out to be be harder due to to heal but nonetheless is doing great at this point as the right that has reopened and it was noted first just several weeks ago with a scab over it and came off in just the past few days. Fortunately there does not appear to be any obvious evidence of significant active infection at this time which is great news. No fevers, chills, nausea, vomiting, or diarrhea. 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 There does not appear to be any signs of this interfering in any way with her wound. She does have swelling we previously had compression socks for her ordered but again it does not look like she wears these on a regular basis by any means. 05/26/2021 upon evaluation today patient appears to be doing well with regard to her wound which is actually showing signs of excellent improvement. There does not appear to be any signs of active infection which is great news and overall very pleased with where things stand today. No fevers, chills, nausea, vomiting, or diarrhea. 06/02/2021 upon evaluation today patient's wound actually showing signs of excellent improvement. Fortunately there does not appear to be any signs of active infection which is great news. I think the patient is making good progress with regard to her wounds in general. 06/09/2021 upon evaluation today patient appears to be doing excellent in regard to her wounds currently. Fortunately there does not appear to be any signs of active infection which is great news. No fevers, chills, nausea, vomiting, or diarrhea. Overall extremely pleased with where things stand today. I think the patient is making excellent progress. 06/16/2021 upon evaluation today patient appears to be doing well in regard to her wound. This is going require little bit of debridement today and that was discussed with the patient. Otherwise she seems to be doing quite well and I am  actually very pleased with where things stand at this point. No fevers, chills, nausea, vomiting, or diarrhea. 06/23/2021 upon evaluation today patient appears to be doing well with regard to her wounds. She has been tolerating the dressing changes without complication. Fortunately  there does not appear to be any evidence of infection and she has not had air in her home which she actually lives at an assisted living that got fixed this morning. With that being said because of that her wrap has been extremely hot and bothersome for her over the past week. 06/30/2021 upon evaluation today patient is actually making excellent progress in regard to her ankle ulcer. She has been tolerating the dressing changes without complication and overall extremely pleased with where things stand there does not appear to be any evidence of active infection which is great news. No fevers, chills, nausea, vomiting, or diarrhea. 07/07/21 upon evaluation today patients and culture on the right actually appears to be doing quite well. There does not appear to be any signs of infection and overall very pleased with where things stand today. No fevers, chills, nausea, or vomiting noted at this time. 07/14/2021 unfortunately the patient today has some evidence of deep tissue injury and pressure getting to the ankle region. Again I am not exactly sure what is going on here but this is very similar to issues that we have had in the past. I explained to the patient that she needs to be very mindful of exactly what is happening I think sleeping in bed is probably the main issue here although there could be other culprits I am not sure what else would potentially lead to this kind of a problem for her. 07/21/2021 upon evaluation today patient's wound actually showing signs of improvement compared to last week. Fortunately there does not appear to be any signs of active infection which is great news and overall very pleased with where  things stand in that regard. With that being said I do believe that she is continuing to show signs of overall of getting better although I think this is still basically about what we were 2 weeks ago due to the worsening and now improvement. 07/28/2021 upon evaluation today patient appears to be doing well with regard to her wound. She does have some slough buildup on the surface of the wound which I would have to manage today. Fortunately there is no sign of active infection at this time. No fevers, chills, nausea, vomiting, or diarrhea. 08/04/2021 upon evaluation today patient appears to be doing about the same in regard to her wound. To be perfectly honest I am beginning to be a little bit concerned about the overall appearance of the wound bed. I do think possibly taking a sample right around the margin of the wound could be beneficial for her as far as identifying anything such as an inflammatory process or to be honest even a skin tag cancer type process that [name] be of concern here. Fortunately there does not appear to be any evidence of active infection at this time which is great news she is not having any pain also great news. 08/11/2021 upon evaluation today patient appears to be doing well with regard to her wound. The good news is I did review her biopsy results and it showed some inflammatory mixed findings but nothing that appeared to be malignant which is great news. Overall this is more of a chronic venous stasis type issue which again is more what we have been treating. Nonetheless I just wanted to make sure before going forward that there was not anything more untoward going on at this point. JINA, GALANOS ([id number]) Objective Constitutional Well-nourished and well-hydrated in no acute distress. Vitals Time Taken: 10:14 AM, Height:  62 in, Weight: 150 lbs, BMI: 27.4, Temperature: 98.4 F, Pulse: 72 bpm, Respiratory Rate: 18 breaths/min, Blood Pressure: 138/71  mmHg. Respiratory normal breathing without difficulty. Psychiatric this patient is able to make decisions and demonstrates good insight into disease process. Alert and Oriented x 3. pleasant and cooperative. General Notes: Upon inspection patient's wound bed actually showed signs of good granulation epithelization at this point. Fortunately there does not appear to be any evidence of infection which is great news and overall I am extremely pleased with where things stand today. No fevers, chills, nausea, vomiting, or diarrhea. I did perform debridement to clear away some of the necrotic debris. With that being said postdebridement I did obtain a wound culture from the clean wound base and we will see what this shows. I am also going to see about getting started on the antibiotic therapy here. Integumentary (Hair, Skin) Wound #7 status is Open. Original cause of wound was Gradually Appeared. The date acquired was: 05/12/2021. The wound has been in treatment 12 weeks. The wound is located on the Right,Lateral Malleolus. The wound measures 1cm length x 1cm width x 0.2cm depth; 0.785cm^2 area and 0.157cm^3 volume. There is Fat Layer (Subcutaneous Tissue) exposed. There is no tunneling or undermining noted. There is a medium amount of serosanguineous drainage noted. The wound margin is distinct with the outline attached to the wound base. There is small (1-33%) red, pink, pale granulation within the wound bed. There is a large (67-100%) amount of necrotic tissue within the wound bed. Assessment Active Problems ICD-10 Pressure ulcer of right ankle, stage 3 Lymphedema, not elsewhere classified Venous insufficiency (chronic) (peripheral) Presence of cardiac pacemaker Long term (current) use of anticoagulants Procedures Wound #7 Pre-procedure diagnosis of Wound #7 is a Pressure Ulcer located on the Right,Lateral Malleolus . There was a Excisional Skin/Subcutaneous Tissue Debridement with a total area  of 1 sq cm performed by Tommie Sams., PA-C. With the following instrument(s): Curette to remove Viable and Non-Viable tissue/material. Material removed includes Subcutaneous Tissue, Slough, Skin: Dermis, and Skin: Epidermis. 1 specimen was taken by a Tissue Culture and sent to the lab per facility protocol. A time out was conducted at 10:30, prior to the start of the procedure. A Moderate amount of bleeding was controlled with Pressure. The procedure was tolerated well with a pain level of 0 throughout and a pain level of 0 following the procedure. Post Debridement Measurements: 1cm length x 1cm width x 0.2cm depth; 0.157cm^3 volume. Post debridement Stage noted as Category/Stage III. Character of Wound/Ulcer Post Debridement is improved. Post procedure Diagnosis Wound #7: Same as Pre-Procedure Plan Follow-up Appointments: Return Appointment in 1 week. Bathing/ Shower/ Hygiene: [name] shower with wound dressing protected with water repellent cover or cast protector. Edema Control - Lymphedema / Segmental Compressive Device / Other: KAJA, KEENER ([id number]) Optional: One layer of unna paste to top of compression wrap (to act as an anchor). - and at toes Elevate, Exercise Daily and Avoid Standing for Long Periods of Time. Elevate legs to the level of the heart and pump ankles as often as possible Elevate leg(s) parallel to the floor when sitting. Laboratory ordered were: Wound culture routine - non healing wound with increased pain The following medication(s) was prescribed: doxycycline hyclate oral 100 mg capsule 1 1 capsule oral taken 2 times per day for 14 days starting 08/15/2021 WOUND #7: - Malleolus Wound Laterality: Right, Lateral Cleanser: Soap and Water 1 x Per Week/30 Days Discharge Instructions: Gently cleanse wound  with antibacterial soap, rinse and pat dry prior to dressing wounds Primary Dressing: Hydrofera Blue Ready Transfer Foam, 2.5x2.5 (in/in) 1 x Per Week/30  Days Discharge Instructions: Apply Hydrofera Blue Ready to wound bed as directed Secondary Dressing: ABD Pad 5x9 (in/in) 1 x Per Week/30 Days Discharge Instructions: Cover with ABD pad Compression Wrap: Profore Lite LF 3 Multilayer Compression Bandaging System 1 x Per Week/30 Days Discharge Instructions: Apply 3 multi-layer wrap as prescribed. 1. Would recommend currently that we going to continue with the wound care measures as before and the patient is in agreement with the plan. This includes the use of the [facility] which I think is getting better for her. 2. I am also can recommend that we have the patient go ahead and begin to continue with covering this with ABD pad and a 3 layer compression wrap I think that still doing a great job. 3. I would also recommend the patient continue to elevate her leg is much as possible she should also continue to wear the zipper compression on the left leg for now. We will see patient back for reevaluation in 1 week here in the clinic. If anything worsens or changes patient will contact our office for additional recommendations. Electronic Signature(s) Signed: 08/15/2021 5:21:55 PM By: Worthy Keeler PA-C Previous Signature: 08/11/2021 12:57:04 PM Version By: Worthy Keeler PA-C Entered By: Worthy Keeler on 08/15/2021 17:21:55 Anderegg, [name] ([id number]) -------------------------------------------------------------------------------- SuperBill Details Patient Name: [name] Date of Service: 08/11/2021 Medical Record Number: [id number] Patient Account Number: [id number] Date of Birth/Sex: [name] (87 y.o. F) Treating RN: Carlene Coria Primary Care Provider: Adrian Prows Other Clinician: Referring Provider: Adrian Prows Treating Provider/Extender: Skipper Cliche in Treatment: 12 Diagnosis Coding ICD-10 Codes Code Description [phone] Pressure ulcer of right ankle, stage 3 I89.0 Lymphedema, not elsewhere  classified I87.2 Venous insufficiency (chronic) (peripheral) Z95.0 Presence of cardiac pacemaker Z79.01 Long term (current) use of anticoagulants Facility Procedures CPT4 Code: [id number] Description: [id number] - DEB SUBQ TISSUE 20 SQ CM/< Modifier: Quantity: 1 CPT4 Code: Description: ICD-10 Diagnosis Description 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, stage 3 Modifier: Quantity: Physician Procedures CPT4 Code: [id number] Description: [id number] - WC PHYS SUBQ TISS 20 SQ CM Modifier: Quantity: 1 CPT4 Code: Description: ICD-10 Diagnosis Description 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, stage 3 Modifier: Quantity: Electronic Signature(s) Signed: 08/11/2021 12:58:44 PM By: Worthy Keeler PA-C Entered By: Worthy Keeler on 08/11/2021 12:58:44

## 2021-08-16 LAB — AEROBIC CULTURE W GRAM STAIN (SUPERFICIAL SPECIMEN)

## 2021-08-17 NOTE — Progress Notes (Signed)
[name], [name] ([id number]) Visit Report for 08/11/2021 Arrival Information Details Patient Name: [name] Date of Service: 08/11/2021 10:15 AM Medical Record Number: [id number] Patient Account Number: [id number] Date of Birth/Sex: [birth year] (87 y.o. F) Treating RN: Carlene Coria Primary Care Denson Niccoli: Adrian Prows Other Clinician: Referring Maidie Streight: Adrian Prows Treating Stace Peace/Extender: Skipper Cliche in Treatment: 12 Visit Information History Since Last Visit All ordered tests and consults were completed: No Patient Arrived: [name] Added or deleted any medications: No Arrival Time: 10:05 Any new allergies or adverse reactions: No Accompanied By: daughter Had a fall or experienced change in No Transfer Assistance: None activities of daily living that may affect Patient Identification Verified: Yes risk of falls: Secondary Verification Process Completed: Yes Signs or symptoms of abuse/neglect since last visito No Patient Requires Transmission-Based No Hospitalized since last visit: No Precautions: Implantable device outside of the clinic excluding No Patient Has Alerts: Yes cellular tissue based products placed in the center Patient Alerts: Patient on Blood since last visit: Thinner Has Dressing in Place as Prescribed: Yes NOT DIABETIC Has Compression in Place as Prescribed: Yes ***ELIQUIS*** Pain Present Now: No Electronic Signature(s) Signed: 08/17/2021 7:54:12 AM By: Carlene Coria RN Entered By: Carlene Coria on 08/11/2021 10:14:52 [name] ([id number]) -------------------------------------------------------------------------------- Clinic Level of Care Assessment Details Patient Name: [name] Date of Service: 08/11/2021 10:15 AM Medical Record Number: [id number] Patient Account Number: [id number] Date of Birth/Sex: Jan 28, 1923 (87 y.o. F) Treating RN: Carlene Coria Primary Care Leyla Soliz: Adrian Prows Other  Clinician: Referring Arlyss Weathersby: Adrian Prows Treating Jericho Cieslik/Extender: Skipper Cliche in Treatment: 12 Clinic Level of Care Assessment Items TOOL 1 Quantity Score _0  - Use when EandM and Procedure is performed on INITIAL visit 0 ASSESSMENTS - Nursing Assessment / Reassessment _1  - General Physical Exam (combine w/ comprehensive assessment (listed just below) when performed on new 0 pt. evals) _2  - 0 Comprehensive Assessment (HX, ROS, Risk Assessments, Wounds Hx, etc.) ASSESSMENTS - Wound and Skin Assessment / Reassessment _3  - Dermatologic / Skin Assessment (not related to wound area) 0 ASSESSMENTS - Ostomy and/or Continence Assessment and Care _4  - Incontinence Assessment and Management 0 _5  - 0 Ostomy Care Assessment and Management (repouching, etc.) PROCESS - Coordination of Care _6  - Simple Patient / Family Education for ongoing care 0 _7  - 0 Complex (extensive) Patient / Family Education for ongoing care _8  - 0 Staff obtains Programmer, systems, Records, Test Results / Process Orders _9  - 0 Staff telephones HHA, Nursing Homes / Clarify orders / etc _10  - 0 Routine Transfer to another Facility (non-emergent condition) _11  - 0 Routine Hospital Admission (non-emergent condition) _12  - 0 New Admissions / Biomedical engineer / Ordering NPWT, Apligraf, etc. _13  - 0 Emergency Hospital Admission (emergent condition) PROCESS - Special Needs _14  - Pediatric / Minor Patient Management 0 _15  - 0 Isolation Patient Management _16  - 0 Hearing / Language / Visual special needs _17  - 0 Assessment of Community assistance (transportation, D/C planning, etc.) _18  - 0 Additional assistance / Altered mentation _19  - 0 Support Surface(s) Assessment (bed, cushion, seat, etc.) INTERVENTIONS - Miscellaneous _20  - External ear exam 0 _21  - 0 Patient Transfer (multiple staff / Civil Service fast streamer / Similar devices) _22  - 0 Simple Staple / Suture removal (25 or less) _23  - 0 Complex Staple / Suture removal  (26 or more) _24  - 0 Hypo/Hyperglycemic Management (do not check if billed separately) _25  - 0 Ankle / Brachial Index (ABI) - do not check if billed separately Has the  patient been seen at the hospital within the last three years: Yes Total Score: 0 Level Of Care: ____ [name] ([id number]) Electronic Signature(s) Signed: 08/17/2021 7:54:12 AM By: Carlene Coria RN Entered By: Carlene Coria on 08/11/2021 11:54:30 [name] ([id number]) -------------------------------------------------------------------------------- Encounter Discharge Information Details Patient Name: [name] Date of Service: 08/11/2021 10:15 AM Medical Record Number: [id number] Patient Account Number: [id number] Date of Birth/Sex: [birth year] (87 y.o. F) Treating RN: Carlene Coria Primary Care Caroleann Casler: Adrian Prows Other Clinician: Referring Zyrell Carmean: Adrian Prows Treating Alleya Demeter/Extender: Skipper Cliche in Treatment: 12 Encounter Discharge Information Items Post Procedure Vitals Discharge Condition: Stable Temperature (F): 98.4 Ambulatory Status: Walker Pulse (bpm): 72 Discharge Destination: Home Respiratory Rate (breaths/min): 18 Transportation: Private Auto Blood Pressure (mmHg): 138/71 Accompanied By: daughter Schedule Follow-up Appointment: Yes Clinical Summary of Care: Patient Declined Electronic Signature(s) Signed: 08/11/2021 11:58:06 AM By: Carlene Coria RN Entered By: Carlene Coria on 08/11/2021 11:58:06 Hessling, [name] ([id number]) -------------------------------------------------------------------------------- Lower Extremity Assessment Details Patient Name: [name] Date of Service: 08/11/2021 10:15 AM Medical Record Number: [id number] Patient Account Number: [id number] Date of Birth/Sex: [birth year] (87 y.o. F) Treating RN: Carlene Coria Primary Care Shontell Prosser: Adrian Prows Other Clinician: Referring Sariah Henkin: Adrian Prows Treating  Kanan Sobek/Extender: Skipper Cliche in Treatment: 12 Edema Assessment Assessed: [Left: No] Patrice Paradise: No] Edema: [Left: Ye] [Right: s] Calf Left: Right: Point of Measurement: 33 cm From Medial Instep 28 cm Ankle Left: Right: Point of Measurement: 10 cm From Medial Instep 17 cm Vascular Assessment Pulses: Dorsalis Pedis Palpable: [Right:Yes] Electronic Signature(s) Signed: 08/17/2021 7:54:12 AM By: Carlene Coria RN Entered By: Carlene Coria on 08/11/2021 10:25:25 Lyons, [name] ([id number]) -------------------------------------------------------------------------------- Multi Wound Chart Details Patient Name: [name] Date of Service: 08/11/2021 10:15 AM Medical Record Number: [id number] Patient Account Number: [id number] Date of Birth/Sex: [birth year] (87 y.o. F) Treating RN: Carlene Coria Primary Care Khamiyah Grefe: Adrian Prows Other Clinician: Referring Yuji Walth: Adrian Prows Treating Mayfield Schoene/Extender: Skipper Cliche in Treatment: 12 Vital Signs Height(in): 62 Pulse(bpm): 72 Weight(lbs): 150 Blood Pressure(mmHg): 138/71 Body Mass Index(BMI): 27 Temperature(F): 98.4 Respiratory Rate(breaths/min): 18 Photos: [N/A:N/A] Wound Location: Right, Lateral Malleolus N/A N/A Wounding Event: Gradually Appeared N/A N/A Primary Etiology: Pressure Ulcer N/A N/A Comorbid History: Cataracts, Congestive Heart Failure, N/A N/A Hypertension, Peripheral Arterial Disease, Osteoarthritis, Neuropathy Date Acquired: 05/12/2021 N/A N/A Weeks of Treatment: 12 N/A N/A Wound Status: Open N/A N/A Measurements L x W x D (cm) 1x1x0.2 N/A N/A Area (cm) : 0.785 N/A N/A Volume (cm) : 0.157 N/A N/A % Reduction in Area: 30.10% N/A N/A % Reduction in Volume: 30.20% N/A N/A Classification: Category/Stage III N/A N/A Exudate Amount: Medium N/A N/A Exudate Type: Serosanguineous N/A N/A Exudate Color: red, brown N/A N/A Wound Margin: Distinct, outline attached N/A N/A Granulation  Amount: Small (1-33%) N/A N/A Granulation Quality: Red, Pink, Pale N/A N/A Necrotic Amount: Large (67-100%) N/A N/A Exposed Structures: Fat Layer (Subcutaneous Tissue): N/A N/A Yes Fascia: No Tendon: No Muscle: No Joint: No Bone: No Epithelialization: Small (1-33%) N/A N/A Treatment Notes Electronic Signature(s) Signed: 08/17/2021 7:54:12 AM By: Carlene Coria RN Entered By: Carlene Coria on 08/11/2021 10:30:10 Dicamillo, [name] ([id number]) -------------------------------------------------------------------------------- Multi-Disciplinary Care Plan Details Patient Name: [name] Date of Service: 08/11/2021 10:15 AM Medical Record Number: [id number] Patient Account Number: [id number] Date of Birth/Sex: [birth year] (87 y.o. F) Treating RN: Carlene Coria Primary Care Adesuwa Osgood: Adrian Prows Other Clinician: Referring Ishika Chesterfield: Adrian Prows Treating Simrit Gohlke/Extender: Skipper Cliche in Treatment: 12 Active Inactive Wound/Skin Impairment  Nursing Diagnoses: Knowledge deficit related to ulceration/compromised skin integrity Goals: Patient/caregiver will verbalize understanding of skin care regimen Date Initiated: 05/13/2021 Date Inactivated: 05/13/2021 Target Resolution Date: 06/12/2021 Goal Status: Met Ulcer/skin breakdown will have a volume reduction of 30% by week 4 Date Initiated: 05/13/2021 Date Inactivated: 06/16/2021 Target Resolution Date: 06/12/2021 Goal Status: Met Ulcer/skin breakdown will have a volume reduction of 50% by week 8 Date Initiated: 05/13/2021 Target Resolution Date: 07/13/2021 Goal Status: Active Ulcer/skin breakdown will have a volume reduction of 80% by week 12 Date Initiated: 05/13/2021 Target Resolution Date: 08/13/2021 Goal Status: Active Ulcer/skin breakdown will heal within 14 weeks Date Initiated: 05/13/2021 Target Resolution Date: 09/12/2021 Goal Status: Active Interventions: Assess patient/caregiver ability to obtain necessary  supplies Assess patient/caregiver ability to perform ulcer/skin care regimen upon admission and as needed Assess ulceration(s) every visit Notes: Electronic Signature(s) Signed: 08/17/2021 7:54:12 AM By: Carlene Coria RN Entered By: Carlene Coria on 08/11/2021 10:30:01 [name] ([id number]) -------------------------------------------------------------------------------- Pain Assessment Details Patient Name: [name] Date of Service: 08/11/2021 10:15 AM Medical Record Number: [id number] Patient Account Number: [id number] Date of Birth/Sex: 02/10/1923 (87 y.o. F) Treating RN: Carlene Coria Primary Care Nealy Karapetian: Adrian Prows Other Clinician: Referring Meria Crilly: Adrian Prows Treating Delaney Schnick/Extender: Skipper Cliche in Treatment: 12 Active Problems Location of Pain Severity and Description of Pain Patient Has Paino No Site Locations Pain Management and Medication Current Pain Management: Electronic Signature(s) Signed: 08/17/2021 7:54:12 AM By: Carlene Coria RN Entered By: Carlene Coria on 08/11/2021 10:15:27 Figge, [name] ([id number]) -------------------------------------------------------------------------------- Patient/Caregiver Education Details Patient Name: [name] Date of Service: 08/11/2021 10:15 AM Medical Record Number: [id number] Patient Account Number: [id number] Date of Birth/Gender: 06/17/1923 (87 y.o. F) Treating RN: Carlene Coria Primary Care Physician: Adrian Prows Other Clinician: Referring Physician: Adrian Prows Treating Physician/Extender: Skipper Cliche in Treatment: 12 Education Assessment Education Provided To: Patient Education Topics Provided Wound/Skin Impairment: Methods: Explain/Verbal Responses: State content correctly Electronic Signature(s) Signed: 08/17/2021 7:54:12 AM By: Carlene Coria RN Entered By: Carlene Coria on 08/11/2021 11:54:50 Shedden, [name]  ([id number]) -------------------------------------------------------------------------------- Wound Assessment Details Patient Name: [name] Date of Service: 08/11/2021 10:15 AM Medical Record Number: [id number] Patient Account Number: [id number] Date of Birth/Sex: 1923-08-30 (87 y.o. F) Treating RN: Carlene Coria Primary Care Prima Rayner: Adrian Prows Other Clinician: Referring Sindia Kowalczyk: Adrian Prows Treating Jamonta Goerner/Extender: Skipper Cliche in Treatment: 12 Wound Status Wound Number: 7 Primary Pressure Ulcer Etiology: Wound Location: Right, Lateral Malleolus Wound Open Wounding Event: Gradually Appeared Status: Date Acquired: 05/12/2021 Comorbid Cataracts, Congestive Heart Failure, Hypertension, Weeks Of Treatment: 12 History: Peripheral Arterial Disease, Osteoarthritis, Neuropathy Clustered Wound: No Photos Wound Measurements Length: (cm) 1 Width: (cm) 1 Depth: (cm) 0.2 Area: (cm) 0.785 Volume: (cm) 0.157 % Reduction in Area: 30.1% % Reduction in Volume: 30.2% Epithelialization: Small (1-33%) Tunneling: No Undermining: No Wound Description Classification: Category/Stage III Wound Margin: Distinct, outline attached Exudate Amount: Medium Exudate Type: Serosanguineous Exudate Color: red, brown Foul Odor After Cleansing: No Slough/Fibrino Yes Wound Bed Granulation Amount: Small (1-33%) Exposed Structure Granulation Quality: Red, Pink, Pale Fascia Exposed: No Necrotic Amount: Large (67-100%) Fat Layer (Subcutaneous Tissue) Exposed: Yes Tendon Exposed: No Muscle Exposed: No Joint Exposed: No Bone Exposed: No Treatment Notes Wound #7 (Malleolus) Wound Laterality: Right, Lateral Cleanser Soap and Water Discharge Instruction: Gently cleanse wound with antibacterial soap, rinse and pat dry prior to dressing wounds Peri-Wound Care Betts, [name] ([id number]) Topical Primary Dressing Hydrofera Blue Ready Transfer Foam, 2.5x2.5  (in/in) Discharge Instruction: Apply Hydrofera  Blue Ready to wound bed as directed Secondary Dressing ABD Pad 5x9 (in/in) Discharge Instruction: Cover with ABD pad Secured With Compression Wrap Profore Lite LF 3 Multilayer Compression Bandaging System Discharge Instruction: Apply 3 multi-layer wrap as prescribed. Compression Stockings Add-Ons Electronic Signature(s) Signed: 08/17/2021 7:54:12 AM By: Carlene Coria RN Entered By: Carlene Coria on 08/11/2021 10:24:21 [name] ([id number]) -------------------------------------------------------------------------------- Vitals Details Patient Name: [name] Date of Service: 08/11/2021 10:15 AM Medical Record Number: [id number] Patient Account Number: [id number] Date of Birth/Sex: 29-Oct-1923 (87 y.o. F) Treating RN: Carlene Coria Primary Care Kirbie Stodghill: Adrian Prows Other Clinician: Referring Delshon Blanchfield: Adrian Prows Treating Jahmere Bramel/Extender: Skipper Cliche in Treatment: 12 Vital Signs Time Taken: 10:14 Temperature (F): 98.4 Height (in): 62 Pulse (bpm): 72 Weight (lbs): 150 Respiratory Rate (breaths/min): 18 Body Mass Index (BMI): 27.4 Blood Pressure (mmHg): 138/71 Reference Range: 80 - 120 mg / dl Electronic Signature(s) Signed: 08/17/2021 7:54:12 AM By: Carlene Coria RN Entered By: Carlene Coria on 08/11/2021 10:15:12

## 2021-08-18 ENCOUNTER — Encounter: Payer: Medicare Other | Admitting: Physician Assistant

## 2021-08-18 ENCOUNTER — Other Ambulatory Visit: Payer: Self-pay

## 2021-08-18 DIAGNOSIS — L89513 Pressure ulcer of right ankle, stage 3: Secondary | ICD-10-CM | POA: Diagnosis not present

## 2021-08-18 NOTE — Progress Notes (Addendum)
[name], [name] ([id number]) Visit Report for 08/18/2021 Chief Complaint Document Details Patient Name: [name], [name]. Date of Service: 08/18/2021 10:15 AM Medical Record Number: [id number] Patient Account Number: [id number] Date of Birth/Sex: [birth year] (87 y.o. F) Treating RN: Carlene Coria Primary Care Provider: Adrian Prows Other Clinician: Referring Provider: Adrian Prows Treating Provider/Extender: Skipper Cliche in Treatment: 13 Information Obtained from: Patient Chief Complaint Right foot ulcer Electronic Signature(s) Signed: 08/18/2021 10:14:24 AM By: Worthy Keeler PA-C Entered By: Worthy Keeler on 08/18/2021 10:14:24 Maack, [name] ([id number]) -------------------------------------------------------------------------------- HPI Details Patient Name: [name] Date of Service: 08/18/2021 10:15 AM Medical Record Number: [id number] Patient Account Number: [id number] Date of Birth/Sex: 03/15/1923 (87 y.o. 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 Dr. Amalia Hailey the podiatrist saw her on 07/20/2017 and sent her to the wound center for possible hyperbaric oxygen therapy. past medical history of peripheral vascular disease, varicose veins, status post appendectomy, basal cell carcinoma excision from the left leg, cholecystectomy, pacemaker placement, right lower extremity angiography done by Dr. dew in March 2017 with placement of a stent. there is also note of a successful ablation of the right small saphenous vein done which was reviewed by ultrasound on 10/24/2016. the  patient had a right small saphenous vein ablation done on 10/20/2016. The patient has never been a smoker. She has been seen by Dr. Corene Cornea dew the vascular surgeon who most recently saw her on 06/15/2017 for evaluation of ongoing problems with right leg swelling. She had a lower extremity arterial duplex examination done(02/13/17) which showed patent distal right superficial femoral artery stent and above-the-knee popliteal stent without evidence of restenosis. The ABI was more than 1.3 on the right and more than 1.3 on the left. This was consistent with noncompressible arteries due to medial calcification. The right great toe pressure and PPG waveforms are within normal limits and the left great toe pressure and PPG waveforms are decreased. he recommended she continue to wear her compression stockings and continue with elevation. She is scheduled to have a noninvasive arterial study in the near future 08/16/2017 -- had a lower extremity arterial duplex examination done which showed patent distal right superficial femoral artery stent and above-the-knee popliteal stent without evidence of restenosis. The ABI was more than 1.3 on the right and more than 1.3 on the left. This was consistent with noncompressible arteries due to medial calcification. The right great toe pressure and PPG waveforms are within normal limits and the left great toe pressure and PPG waveforms are decreased. the x-ray of the right ankle has not yet been done 08/24/2017 -- had a right ankle x-ray -- IMPRESSION:1. No fracture, bone lesion or evidence of osteomyelitis. 2. Lateral soft tissue swelling with a soft tissue ulcer. she has not yet seen the vascular surgeon for review 08/31/17 on evaluation today patient's wound appears to be showing signs of improvement. She still with her appointment with vascular in order to review her results of her vascular study and then determine if any intervention would be recommended at that  time. No fevers, chills, nausea, or vomiting noted at this time. She has been tolerating the dressing changes without complication. 09/28/17 on evaluation today patient's  wound appears to show signs of good improvement in regard to the granulation tissue which is surfacing. There is still a layer of slough covering the wound and the posterior portion is still significantly deeper than the anterior nonetheless there has been some good sign of things moving towards the better. She is going to go back to Dr. dew for reevaluation to ensure her blood flow is still appropriate. That will be before her next evaluation with [place] next week. No fevers, chills, nausea, or vomiting noted at this time. Patient does have some discomfort rated to be a 3-4/10 depending on activity specifically cleansing the wound makes it worse. 10/05/2017 -- the patient was seen by Dr. Lucky Cowboy last week and noninvasive studies showed a normal right ABI with brisk triphasic waveforms consistent with no arterial insufficiency including normal digital pressures. The duplex showed a patent distal right SFA stent and the proximal SFA was also normal. He was pleased with her test and thought she should have enough of perfusion for normal wound healing. He would see her back in 6 months time. 12/21/17 on evaluation today patient appears to be doing fairly well in regard to her right lateral ankle wound. Unfortunately the main issue that she is expansion at this point is that she is having some issues with what appears to be some cellulitis in the right anterior shin. She has also been noting a little bit of uncomfortable feeling especially last night and her ankle area. I'm afraid that she made the developing a little bit of an infection. With that being said I think it is in the early stages. 12/28/17 on evaluation today patient's ankle appears to be doing excellent. She's making good progress at this point the cellulitis seems to have improved  after last week's evaluation. Overall she is having no significant discomfort which is excellent news. She does have an appointment with Dr. dew on March 29, 2018 for reevaluation in regard to the stent he placed. She seems to have excellent blood flow in the right lower extremity. 01/19/12 on evaluation today patient's wound appears to be doing very well. In fact she does not appear to require debridement at this point, there's no evidence of infection, and overall from the standpoint of the wound she seems to be doing very well. With that being said I believe that it may be time to switch to different dressing away from the [facility] Dressing she tells me she does have a lot going on her friend actually passed away yesterday and she's also having a lot of issues with her husband this obviously is weighing heavy on her as far as your thoughts and concerns today. 01/25/18 on evaluation today patient appears to be doing fairly well in regard to her right lateral malleolus. She has been tolerating the dressing changes without complication. Overall I feel like this is definitely showing signs of improvement as far as how the overall appearance of the wound is there's also evidence of epithelium start to migrate over the granulation tissue. In general I think that she is progressing nicely as far as the wound is concerned. The only concern she really has is whether or not we can switch to every other week visits in order to avoid having as many appointments as her daughters have a difficult time getting her to her appointments as well as the patient's husband to his he is not doing very well at this point. 02/22/18 on evaluation today patient's right lateral malleolus ulcer  appears to be doing great. She has been tolerating the dressing changes without complication. Overall you making excellent progress at this time. Patient is having no significant discomfort. MATTYE, VERDONE ([id number]) 03/15/18  on evaluation today patient appears to be doing much more poorly in regard to her right lateral ankle ulcer at this point. Unfortunately since have last seen her her husband has passed just a few days ago is obviously weighed heavily on her her daughter also had surgery well she is with her today as usual. There does not appear to be any evidence of infection she does seem to have significant contusion/deep tissue injury to the right lateral malleolus which was not noted previous when I saw her last. It's hard to tell of exactly when this injury occurred although during the time she was spending the night in the hospital this may have been most likely. 03/22/18 on evaluation today patient appears to actually be doing very well in regard to her ulcer. She did unfortunately have a setback which was noted last week however the good news is we seem to be getting back on track and in fact the wound in the core did still have some necrotic tissue which will be addressed at this point today but in general I'm seeing signs that things are on the up and up. She is glad to hear this obviously she's been somewhat concerned that due to the how her wound digressed more recently. 03/29/18 on evaluation today patient appears to be doing fairly well in regard to her right lower extremity lateral malleolus ulcer. She unfortunately does have a new area of pressure injury over the inferior portion where the wound has opened up a little bit larger secondary to the pressure she seems to be getting. She does tell me sometimes when she sleeps at night that it actually hurts and does seem to be pushing on the area little bit more unfortunately. There does not appear to be any evidence of infection which is good news. She has been tolerating the dressing changes without complication. She also did have some bruising in the left second and third toes due to the fact that she may have bump this or injured it although she has  neuropathy so she does not feel she did move recently that may have been where this came from. Nonetheless there does not appear to be any evidence of infection at this time. 04/12/18 on evaluation today patient's wound on the right lateral ankle actually appears to be doing a little bit better with a lot of necrotic docking tissue centrally loosening up in clearing away. However she does have the beginnings of a deep tissue injury on the left lateral malleolus likely due to the fact we've been trying offload the right as much as we have. I think she may benefit from an assistive soft device to help with offloading and it looks like they're looking at one of the doughnut conditions that wraps around the lower leg to offload which I think will definitely do a good job. With that being said I think we definitely need to address this issue on the left before it becomes a wound. Patient is not having significant pain. 04/19/18 on evaluation today patient appears to be doing excellent in regard to the progress she's made with her right lateral ankle ulcer. The left ankle region which did show evidence of a deep tissue injury seems to be resolving there's little fluid noted underneath and a blister there's  nothing open at this point in time overall I feel like this is progressing nicely which is good news. She does not seem to be having significant discomfort at this point which is also good news. 04/25/18-She is here in follow up evaluation for bilateral lateral malleolar ulcers. The right lateral malleolus ulcer with pale subcutaneous tissue exposure, central area of ulcer with tendon/periosteum exposed. The left lateral malleolus ulcer now with central area of nonviable tissue, otherwise deep tissue injury. She is wearing compression wraps to the left lower extremity, she will place the right lower extremity compression wraps on when she gets home. She will be out of town over the weekend and return next  week and follow-up appointment. She completed her doxycycline this morning 05/03/18 on evaluation today patient appears to be doing very well in regard to her right lateral ankle ulcer in general. At least she's showing some signs of improvement in this regard. Unfortunately she has some additional injury to the left lateral malleolus region which appears to be new likely even over the past several days. Again this determination is based on the overall appearance. With that being said the patient is obviously frustrated about this currently. 05/10/18-She is here in follow-up evaluation for bilateral lateral malleolar ulcers. She states she has purchased offloading shoes/boots and they will arrive tomorrow. She was asked to bring them in the office at next week's appointment so her provider is aware of product being utilized. She continues to sleep on right or left side, she has been encouraged to sleep on her back. The right lateral malleolus ulcer is precariously close to peri-osteum; will order xray. The left lateral malleolus ulcer is improved. Will switch back to santyl; she will follow up next week. 05/17/18 on evaluation today patient actually appears to be doing very well in regard to her malleolus her ulcers compared to last time I saw them. She does not seem to have as much in the way of contusion at this point which is great news. With that being said she does continue to have discomfort and I do believe that she is still continuing to benefit from the offloading/pressure reducing boots that were recommended. I think this is the key to trying to get this to heal up completely. 05/24/18 on evaluation today patient actually appears to be doing worse at this point in time unfortunately compared to her last week's evaluation. She is having really no increased pain which is good news unfortunately she does have more maceration in your theme and noted surrounding the right lateral ankle the left  lateral ankle is not really is erythematous I do not see signs of the overt cellulitis on that side. Unfortunately the wounds do not seem to have shown any signs of improvement since the last evaluation. She also has significant swelling especially on the right compared to previous some of this may be due to infection however also think that she may be served better while she has these wounds by compression wrapping versus continuing to use the Juxta-Lite for the time being. Especially with the amount of drainage that she is experiencing at this point. No fevers, chills, nausea, or vomiting noted at this time. 05/31/18 on evaluation today patient appears to actually be doing better in regard to her right lateral lower extremity ulcer specifically on the malleolus region. She has been tolerating the antibiotic without complication. With that being said she still continues to have issues but a little bit of redness although nothing like  she what she was experiencing previous. She still continues to pressure to her ankle area she did get the problem on offloading boots unfortunately she will not wear them she states there too uncomfortable and she can't get in and out of the bed. Nonetheless at this point her wounds seem to be continually getting worse which is not what we want I'm getting somewhat concerned about her progress and how things are going to proceed if we do not intervene in some way shape or form. I therefore had a very lengthy conversation today about offloading yet again and even made a specific suggestion for switching her to a memory foam mattress and even gave the information for a specific one that they could look at getting if it was something that they were interested in considering. She does not want to be considered for a hospital bed air mattress although honestly insurance would not cover it that she does not have any wounds on her trunk. 06/14/18 on evaluation today both wounds  over the bilateral lateral malleolus her ulcers appear to be doing better there's no evidence of pressure injury at this point. She did get the foam mattress for her bed and this does seem to have been extremely beneficial for her in my pinion. Her daughter states that she is having difficulty getting out of bed because of how soft it is. The patient also relates this to be. Nonetheless I do feel like she's actually doing better. Unfortunately right after and around the time she was getting the mattress she also sustained a fall when she got up to go pick up the phone and ended up injuring her right elbow she has [address] sut[address] place. We are not caring for this currently although home health is going to be taking the sutures out shortly. Nonetheless this may be something that we need to evaluate going forward. It depends on how well it has or has not healed in the end. She also recently saw an orthopedic specialist for an injection in the right shoulder just before her fall unfortunately the fall seems to have worsened her pain. 06/21/18 on evaluation today patient appears to be doing about the same in regard to her lateral malleolus ulcers. Both appear to be just a little bit deeper but again we are clinging away the necrotic and dead tissue which I think is why this is progressing towards a deeper realm as opposed MONCHEL, POLLITT. ([id number]) to improving from my measurement standpoint in that regard. Nonetheless she has been tolerating the dressing changes she absolutely hates the memory foam mattress topper that was obtained for her nonetheless I do believe this is still doing excellent as far as taking care of excess pressure in regard to the lateral malleolus regions. She in fact has no pressure injury that I see whereas in weeks past it was week by week I was constantly seeing new pressure injuries. Overall I think it has been very beneficial for her. 07/03/18; patient arrives in my clinic  today. She has deep punched out areas over her bilateral lateral malleoli. The area on the right has some more depth. We spent a lot of time today talking about pressure relief for these areas. This started when her daughter asked for a prescription for a memory foam mattress. I have never written a prescription for a mattress and I don't think insurances would pay for that on an ordinary bed. In any case he came up that she has foam  boots that she refuses to wear. I would suggest going to these before any other offloading issues when she is in bed. They say she is meticulous about offloading this the rest of the day 07/10/18- She is seen in follow-up evaluation for bilateral, lateral malleolus ulcers. There is no improvement in the ulcers. She has purchased and is sleeping on a memory foam mattress/overlay, she has been using the offloading boots nightly over the past week. She has a follow up appointment with vascular medicine at the end of October, in my opinion this follow up should be expedited given her deterioration and suboptimal TBI results. We will order plain film xray of the left ankle as deeper structures are palpable; would consider having MRI, regardless of xray report(s). The ulcers will be treated with iodoflex/iodosorb, she is unable to safely change the dressings daily with santyl. 07/19/18 on evaluation today patient appears to be doing in general visually well in regard to her bilateral lateral malleolus ulcers. She has been tolerating the dressing changes without complication which is good news. With that being said we did have an x-ray performed on 07/12/18 which revealed a slight loosen see in the lateral portion of the distal left fibula which may represent artifact but underline lytic destruction or osteomyelitis could not be excluded. MRI was recommended. With that being said we can see about getting the patient scheduled for an MRI to further evaluate this area. In fact we have  that scheduled currently for August 20 19,019. 07/26/18 on evaluation today patient's wound on the right lateral ankle actually appears to be doing fairly well at this point in my pinion. She has made some good progress currently. With that being said unfortunately in regard to the left lateral ankle ulcer this seems to be a little bit more problematic at this time. In fact as I further evaluated the situation she actually had bone exposed which is the first time that's been the case in the bone appear to be necrotic. Currently I did review patient's note from Dr. Bunnie Domino office with [facility]. He stated that ABI was 1.26 on the right and 0.95 on the left with good waveforms. Her perfusion is stable not reduced from previous studies and her digital waveforms were pretty good particularly on the right. His conclusion upon review of the note was that there was not much she could do to improve her perfusion and he felt she was adequate for wound healing. His suggestion was that she continued to see [place] and consider a synthetic skin graft if there was no underlying infection. He plans to see her back in six months or as needed. 08/01/18 on evaluation today patient appears to be doing better in regard to her right lateral ankle ulcer. Her left lateral ankle ulcer is about the same she still has bone involvement in evidence of necrosis. There does not appear to be evidence of infection at this time On the right lateral lower extremity. I have started her on the Augmentin she picked this up and started this yesterday. This is to get her through until she sees infectious disease which is scheduled for 08/12/18. 08/06/18 on evaluation today patient appears to be doing rather well considering my discussion with patient's daughter at the end of last week. The area which was marked where she had erythema seems to be improved and this is good news. With that being said overall the patient seems  to be making good improvement when it  comes to the overall appearance of the right lateral ankle ulcer although this has been slow she at least is coming around in this regard. Unfortunately in regard to the left lateral ankle ulcer this is osteomyelitis based on the pathology report as well is bone culture. Nonetheless we are still waiting CT scan. Unfortunately the MRI we originally ordered cannot be performed as the patient is a pacemaker which I had overlooked. Nonetheless we are working on the CT scan approval and scheduling as of now. She did go to the hospital over the weekend and was placed on IV Cefzo for a couple of days. Fortunately this seems to have improved the erythema quite significantly which is good news. There does not appear to be any evidence of worsening infection at this time. She did have some bleeding after the last debridement therefore I did not perform any sharp debridement in regard to left lateral ankle at this point. Patient has been approved for a snap vac for the right lateral ankle. 08/14/18; the patient with wounds over her bilateral lateral malleoli. The area on the right actually looks quite good. Been using a snap back on this area. Healthy granulation and appears to be filling in. Unfortunately the area on the left is really problematic. 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 Also noted to have cellulitis. She saw Dr. Novella Olive of infectious disease today and was put on linezolid. We are able to verify this with her pharmacy. She is completed the Augmentin that she was already on. We've been using Iodoflex to this area 08/23/18 on evaluation today patient's wounds both actually appear to be doing better compared to my prior evaluations. Fortunately she showing signs of good improvement in regard to the overall wound status especially where were using the snap vac on the right. In  regard to left lateral malleolus the wound bed actually appears to be much cleaner than previously noted. I do not feel any phone directly probed during evaluation today and though there is tendon noted this does not appear to be necrotic it's actually fairly good as far as the overall appearance of the tendon is concerned. In general the wound bed actually appears to be doing significantly better than it was previous. Patient is currently in the care of [name] and I did review that note today. He actually has her on two weeks of linezolid and then following the patient will be on 1-2 months of Keflex. That is the plan currently. She has been on antibiotics therapy as prescribed by myself initially starting on July 30, 2018 and has been on that continuously up to this point. 08/30/18 on evaluation today patient actually appears to be doing much better in regard to her right lateral malleolus ulcer. She has been tolerating the dressing changes specifically the snap vac without complication although she did have some issues with the seal currently. Apparently there was some trouble with getting it to maintain over the past week past Sunday. Nonetheless overall the wound appears better in regard to the right lateral malleolus region. In regard to left lateral malleolus this actually show some signs of additional granulation although there still tendon noted in the base of the wound this appears to be healthy not necrotic in any way whatsoever. We are considering potentially using a snap vac for the left lateral malleolus as well the product wrap from [employer], [place], was present in  the clinic today we're going to see this patient I did have her come in with me after obtaining consent from the patient and her daughter in order to look at the wound and see if there's any recommendation one way or another as to whether or not they felt the snapback could be beneficial for the left lateral malleolus region.  But the conclusion was that it might be but that this is definitely a little bit deeper wound than what traditionally would be utilized for a snap vac. 09/06/18 on evaluation today patient actually appears to be doing excellent in my pinion in regard to both ankle ulcers. She has been tolerating the dressing changes without complication which is great news. Specifically we have been using the snap vac. In regard to the right ankle I'm not even sure that this is going to be necessary for today and following as the wound has filled in quite nicely. In regard to the left ankle I do believe SEASON, ASTACIO ([id number]) that we're seeing excellent epithelialization from the edge as well as granulation in the central portion the tendon is still exposed but there's no evidence of necrotic bone and in general I feel like the patient has made excellent progress even compared to last week with just one week of the snap vac. 09/11/18; this is a patient who has wounds on her bilateral lateral malleoli. Initially both of these were deep stage IV wounds in the setting of chronic arterial insufficiency. She has been revascularized. As I understand think she been using snap vacs to both of these wounds however the area on the right became more superficial and currently she is only using it on the left. Using silver collagen on the right and silver collagen under the back on the left I believe 09/19/18 on evaluation today patient actually appears to be doing very well in regard to her lateral malleolus or ulcers bilaterally. She has been tolerating the dressing changes without complication. Fortunately there does not appear to be any evidence of infection at this time. Overall I feel like she is improving in an excellent manner and I'm very pleased with the fact that everything seems to be turning towards the better for her. This has obviously been a long road. 09/27/18 on evaluation today patient actually appears  to be doing very well in regard to her bilateral lateral malleolus ulcers. She has been tolerating the dressing changes without complication. Fortunately there does not appear to be any evidence of infection at this time which is also great news. No fevers, chills, nausea, or vomiting noted at this time. Overall I feel like she is doing excellent with the snap vac on the left malleolus. She had 40 mL of fluid collection over the past week. 10/04/18 on evaluation today patient actually appears to be doing well in regard to her bilateral lateral malleolus ulcers. She continues to tolerate the dressing changes without complication. One issue that I see is the snap vac on the left lateral malleolus which appears to have sealed off some fluid underlying this area and has not really allowed it to heal to the degree that I would like to see. For that reason I did suggest at this point we may want to pack a small piece of packing strip into this region to allow it to more effectively wick out fluid. 10/11/18 in general the patient today does not feel that she has been doing very well. She's been a little bit lethargic and  subsequently is having bodyaches as well according to what she tells me today. With that being said overall she has been concerned with the fact that something may be worsening although to be honest her wounds really have not been appearing poorly. She does have a new ulcer on her left heel unfortunately. This may be pressure related. Nonetheless it seems to me to have potentially started at least as a blister I do not see any evidence of deep tissue injury. In regard to the left ankle the snap vac still seems to be causing the ceiling off of the deeper part of the wound which is in turn trapping fluid. I'm not extremely pleased with the overall appearance as far as progress from last week to this week therefore I'm gonna discontinue the snap vac at this point. 10/18/18 patient unfortunately  this point has not been feeling well for the past several days. She was seen by Grayland Ormond her primary care provider who is a Librarian, academic at [facility]. Subsequently she states that she's been very weak and generally feeling malaise. No fevers, chills, nausea, or vomiting noted at this time. With that being said bloodwork was performed at the PCP office on the 11th of this month which showed a white blood cell count of 10.7. This was repeated today and shows a white blood cell count of 12.4. This does show signs of worsening. Coupled with the fact that she is feeling worse and that her left ankle wound is not really showing signs of improvement I feel like this is an indication that the osteomyelitis is likely exacerbating not improving. Overall I think we may also want to check her C-reactive protein and sedimentation rate. Actually did call [facility] office this afternoon while the patient was in the office here with me. Subsequently based on the findings we discussed treatment possibilities and I think that it is appropriate for [place] to go ahead and initiate treatment with doxycycline which I'm going to do. Subsequently he did agree to see about adding a CRP and sedimentation rate to her orders. If that has not already been drawn to where they can run it they will contact the patient she can come back to have that check. They are in agreement with plan as far as the patient and her daughter are concerned. Nonetheless also think we need to get in touch with Dr. Henreitta Leber office to see about getting the patient scheduled with him as soon as possible. 11/08/18 on evaluation today patient presents for follow-up concerning her bilateral foot and ankle ulcers. I did do an extensive review of her chart in epic today. Subsequently she was seen by [name] he did initiate Cefepime IV antibiotic therapy. Subsequently she had some issues with her PICC line this had to be removed because it  was coiled and then replaced. Fortunately that was now settled. Unfortunately she has continued have issues with her left heel as well as the issues that she is experiencing with her bilateral lateral malleolus regions. I do believe however both areas seem to be doing a little bit better on evaluation today which is good news. No fevers, chills, nausea, or vomiting noted at this time. She actually has an angiogram schedule with Dr. dew on this coming Monday, November 11, 2018. Subsequently the patient states that she is feeling much better especially than what she was roughly 2 weeks ago. She actually had to cancel an appointment because she was feeling so poorly. No fevers,  chills, nausea, or vomiting noted at this time. 11/15/18 on evaluation today patient actually is status post having had her angiogram with Dr. dew Monday, four days ago. 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 He had to go and work on several areas of the vasculature fortunately he was able to obtain no more than a 30% residual stenosis throughout post procedure. I reviewed this note today. I think this will definitely help with healing at this time. Fortunately there does not appear to be any signs of infection and I do feel like ratio already has a better appearance to it. 11/22/18 upon evaluation today patient actually appears to be doing very well in regard to her wounds in general. The right lateral malleolus looks excellent the heel looks better in the left lateral malleolus also appears to be doing a little better. With that being said the right second toe actually appears to be open and training we been watching this is been dry and stable but now is open. 12/03/2018 Seen today for follow-up and management of multiple bilateral lower extremity wounds. New pressure injury of the great toe which is closed at this time. Wound of the right distal second toe appears larger today with deep undermining  and a pocket of fluid present within the undermining region. Left and right malleolus is wounds are stable today with no signs and symptoms of infection.Denies any needs or concerns during exam today. 12/13/18 on evaluation today patient appears to be doing somewhat better in regard to her left heel ulcer. She also seems to be completely healed in regard to the right lateral malleolus ulcer. The left malleolus ulcer is smaller what unfortunately the wounds which are new over the first and second toes of the right foot are what are most concerning at this point especially the second. Both areas did require sharp debridement today. 12/20/18 on evaluation today patient's wound actually appears to be doing better in regard to left lateral ankle and her right lateral ankle continues to remain healed. The hill ulcer on the left is improved. She does have improvement noted as well in regard to both toe ulcers. Overall I'm very pleased in this regard. No fevers, chills, nausea, or vomiting noted at this time. 12/23/18 on evaluation today patient is seen after she had her toenails trimmed at the podiatrist office due to issues with her right great toe. There was what appeared to be dark eschar on the surface of the wound which had her in the podiatrist concerned. Nonetheless as I remember that during the last office visit I had utilize silver nitrate of this area I was much less concerned about the situation. Subsequently I was able to clean off much of this tissue without any complication today. This does not appear to show any signs of infection and actually look somewhat better Somera, [name] ([id number]) compared to last time post debridement. Her second toe on the right foot actually had callous over and there did appear still be some fluid underneath this that would require debridement today. 12/27/18 on evaluation today patient actually appears to be showing signs of improvement at all locations. Even  the left lateral ankle although this is not quite as great as the other sites. Fortunately there does not appear to be any signs of infection at this time and both of her toes on the right foot seem to be showing signs of improvement which is good news and very pleased  in this regard. 01/03/19 on evaluation today patient appears to be doing better for the most part in regard to her wounds in particular. There does not appear to be any evidence of infection at this time which is good news. Fortunately there is no sign of really worsening anywhere except for the right great toe which she does have what appears to be a bruise/deep tissue injury which is very superficial and already resolving. I'm not sure where this came from I questioned her extensively and she does not recall what may have happened with this. Other than that the patient seems to be doing well even the left lateral ankle ulcer looks good and is getting smaller. 01/10/19 on evaluation today patient appears to be doing well in regard to her left heel wound and both of her toe wounds. Overall I feel like there is definitely improvement here and I'm happy in that regard. With that being said unfortunately she is having issues with the left lateral malleolus ulcer which unfortunately still has a lot of depth to it. This is gonna be a very difficult wound for [place] to be able to truly get to heal. I may want to consider some type of skin substitute to see if this would be of benefit for her. I'll discuss this with her more the next visit most likely. This was something I thought about more at the end of the visit when I was Artie out of the room and the patient had been discharged. 01/17/19 on evaluation today patient appears to be doing very well in regard to her wounds in general. She's been making excellent progress at this time. Fortunately there's no sign of infection at this time either. No fevers, chills, nausea, or vomiting noted at this  time. The biggest issue is still her left lateral malleolus where it appears to be doing well and is getting smaller but still shows a small corner where this is deeper and goes down into what appears to be the joint space. Nonetheless this is taking much longer to heal although it still looks better in smaller than previous evaluations. 01/24/19 on evaluation today patient's wounds actually appear to be doing rather well in general overall. She did require some sharp debridement in regard to the right great toe but everything else appears to be doing excellent no debridement was even necessary. No fevers, chills, nausea, or vomiting noted at this time. 01/31/19 on evaluation today patient actually appears to be doing much better in regard to her left foot wound on the heel as well as the ankle. The right great toe appears to be a little bit worse today this had callous over and trapped a lot of fluid underneath. Fortunately there's no signs of infection at any site which is great news. 02/07/19 on evaluation today patient actually appears to be doing decently well in regard to all of her ulcers at this point. No sharp debridement was required she is a little bit of hyper granulation in regard to the left lateral ankle as well as the left heel but the hill itself is almost completely healed which is excellent news. Overall been very pleased in this regard. 02/14/19 on evaluation today patient actually appears to be doing very well in regard to her ulcers on the right first toe, left lateral malleolus, and left heel. In fact the heel is almost completely healed at this point. The patient does not show any signs of infection which is good news. Overall very  pleased with how things have progressed. 04/18/19 Telehealth Evaluation During the COVID-19 National Emergency: Verbal Consent: Obtained from patient Allergies: reviewed and the active list is current. Medication changes: patient has no current  medication changes. COVID-19 Screening: 1. Have you traveled internationally or on a cruise ship in the last 14 dayso No 2. Have you had contact with someone with or under investigation for COVID-19o No 3. Have you had a fever, cough, sore throat, or experiencing shortness of breatho No on evaluation today actually did have a visit with this patient through a telehealth encounter with her home health nurse. Subsequently it was noted that the patient actually appears to be doing okay in regard to her wounds both the right great toe as well as the left lateral malleolus have shown signs of improvement although this in your theme around the left lateral malleolus there eschar coverings for both locations. The question is whether or not they are actually close and whether or not home health needs to discharge the patient or not. Nonetheless my concern is this point obviously is that without actually seeing her and being able to evaluate this directly I cannot ensure that she is completely healed which is the question that I'm being asked. 04/22/19 on evaluation today patient presents for her first evaluation since last time I saw her which was actually February 14, 2019. I did do a telehealth visit last week in which point it was questionable whether or not she may be healed and had to bring her in today for confirmation. With that being said she does seem to be doing quite well at this point which is good news. There does not appear to be any drainage in the deed I believe her wounds may be healed. Readmission: 09/04/2019 on evaluation today patient appears to be doing unfortunately somewhat more poorly in regard to her left foot ulcer secondary to a wound that began on 08/21/2019 at least when she first noticed this. Fortunately she has not had any evidence of active infection at this time. Systemically. I also do not necessarily see any evidence of infection at the blister/wound site on the first  metatarsal head plantar aspect. This almost appears to be something that may have just rubbed inappropriately causing this to breakdown. They did not want a wait too long to come in to be seen as again she had significant issues in the past with wounds that took quite a while to heal in fact it was close to 2 years. Nonetheless this does not appear to be quite that bad but again we do need to remove some of the necrotic tissue from the surface of the wound to tell exactly the extent. She does not appear to have any significant arterial disease at this point and again her last ABIs and TBI's are recorded above in the alert section her left ABI was 1.27 with a TBI of 0.72 to the right ABI 1.08 with a TBI of 0.39. Other than this the patient has been doing quite well since I last saw her and that was in May 2020. 09/11/2019 on evaluation today patient appeared to be doing very well with regard to her plantar foot ulcer on the left. In fact this appears to be almost completely healed which is awesome. That is after just 1 week of intervention. With that being said there is no signs of active infection at this time. AMMY, LIENHARD ([id number]) 09/18/2019 on evaluation today patient actually appears to be doing  excellent in fact she is completely healed based on what I am seeing at this point. Fortunately there is no signs of active infection at this time and overall patient is very pleased to hear that this area has healed so quickly. Readmission: 05/13/2021 upon evaluation today this patient presents for reevaluation here in the clinic. This is a wound that actually we previously took care of. She had 1 on the right ankle and the left the left turned out to be be harder due to to heal but nonetheless is doing great at this point as the right that has reopened and it was noted first just several weeks ago with a scab over it and came off in just the past few days. Fortunately there does not appear to  be any obvious evidence of significant active infection at this time which is great news. No fevers, chills, nausea, vomiting, or diarrhea. 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 There does not appear to be any signs of this interfering in any way with her wound. She does have swelling we previously had compression socks for her ordered but again it does not look like she wears these on a regular basis by any means. 05/26/2021 upon evaluation today patient appears to be doing well with regard to her wound which is actually showing signs of excellent improvement. There does not appear to be any signs of active infection which is great news and overall very pleased with where things stand today. No fevers, chills, nausea, vomiting, or diarrhea. 06/02/2021 upon evaluation today patient's wound actually showing signs of excellent improvement. Fortunately there does not appear to be any signs of active infection which is great news. I think the patient is making good progress with regard to her wounds in general. 06/09/2021 upon evaluation today patient appears to be doing excellent in regard to her wounds currently. Fortunately there does not appear to be any signs of active infection which is great news. No fevers, chills, nausea, vomiting, or diarrhea. Overall extremely pleased with where things stand today. I think the patient is making excellent progress. 06/16/2021 upon evaluation today patient appears to be doing well in regard to her wound. This is going require little bit of debridement today and that was discussed with the patient. Otherwise she seems to be doing quite well and I am actually very pleased with where things stand at this point. No fevers, chills, nausea, vomiting, or diarrhea. 06/23/2021 upon evaluation today patient appears to be doing well with regard to her wounds. She has been tolerating the dressing changes without complication.  Fortunately there does not appear to be any evidence of infection and she has not had air in her home which she actually lives at an assisted living that got fixed this morning. With that being said because of that her wrap has been extremely hot and bothersome for her over the past week. 06/30/2021 upon evaluation today patient is actually making excellent progress in regard to her ankle ulcer. She has been tolerating the dressing changes without complication and overall extremely pleased with where things stand there does not appear to be any evidence of active infection which is great news. No fevers, chills, nausea, vomiting, or diarrhea. 07/07/21 upon evaluation today patients and culture on the right actually appears to be doing quite well. There does not appear to be any signs of infection and overall very pleased with where things stand today. No fevers,  chills, nausea, or vomiting noted at this time. 07/14/2021 unfortunately the patient today has some evidence of deep tissue injury and pressure getting to the ankle region. Again I am not exactly sure what is going on here but this is very similar to issues that we have had in the past. I explained to the patient that she needs to be very mindful of exactly what is happening I think sleeping in bed is probably the main issue here although there could be other culprits I am not sure what else would potentially lead to this kind of a problem for her. 07/21/2021 upon evaluation today patient's wound actually showing signs of improvement compared to last week. Fortunately there does not appear to be any signs of active infection which is great news and overall very pleased with where things stand in that regard. With that being said I do believe that she is continuing to show signs of overall of getting better although I think this is still basically about what we were 2 weeks ago due to the worsening and now improvement. 07/28/2021 upon evaluation  today patient appears to be doing well with regard to her wound. She does have some slough buildup on the surface of the wound which I would have to manage today. Fortunately there is no sign of active infection at this time. No fevers, chills, nausea, vomiting, or diarrhea. 08/04/2021 upon evaluation today patient appears to be doing about the same in regard to her wound. To be perfectly honest I am beginning to be a little bit concerned about the overall appearance of the wound bed. I do think possibly taking a sample right around the margin of the wound could be beneficial for her as far as identifying anything such as an inflammatory process or to be honest even a skin tag cancer type process that may be of concern here. Fortunately there does not appear to be any evidence of active infection at this time which is great news she is not having any pain also great news. 08/11/2021 upon evaluation today patient appears to be doing well with regard to her wound. The good news is I did review her biopsy results and it showed some inflammatory mixed findings but nothing that appeared to be malignant which is great news. Overall this is more of a chronic venous stasis type issue which again is more what we have been treating. Nonetheless I just wanted to make sure before going forward that there was not anything more untoward going on at this point. 08/18/2021 upon evaluation today patient appears to be doing well with regard to her ankle ulcer. Fortunately there does not appear to be any signs of active infection at this time which is great overall wound is dramatically improved compared to last week. Since last week I have actually placed her on doxycycline and subsequently this is a good option as far as the findings are concerned at this point. I do believe that the positive result of MRSA is definitely something that needed to be addressed and the good news is The doxycycline is doing a good job of doing  this. the doxycycline is doing that. There does not appear to be any evidence of active infection systemically which is great news. Electronic Signature(s) Signed: 08/18/2021 11:11:13 AM By: Worthy Keeler PA-C Entered By: Worthy Keeler on 08/18/2021 11:11:13 Goertzen, [name] ([id number]) -------------------------------------------------------------------------------- Physical Exam Details Patient Name: [name] Date of Service: 08/18/2021 10:15 AM Medical Record Number:  [id number] Patient Account Number: [id number] Date of Birth/Sex: Apr 05, 1923 (87 y.o. F) Treating RN: Carlene Coria Primary Care Provider: Adrian Prows Other Clinician: Referring Provider: Adrian Prows Treating Provider/Extender: Skipper Cliche in Treatment: 23 Constitutional Well-nourished and well-hydrated in no acute distress. Respiratory normal breathing without difficulty. Psychiatric this patient is able to make decisions and demonstrates good insight into disease process. Alert and Oriented x 3. pleasant and cooperative. Notes Upon inspection patient's wound bed actually showed signs of better granulation epithelization than what we have seen for some time. There does not appear to be any evidence of active infection at this time which is great news and overall I think that the doxycycline is doing a great job. No sharp debridement was performed today. Electronic Signature(s) Signed: 08/18/2021 4:01:03 PM By: Worthy Keeler PA-C Entered By: Worthy Keeler on 08/18/2021 16:01:02 Cruzan, [name] ([id number]) -------------------------------------------------------------------------------- Physician Orders Details Patient Name: [name] Date of Service: 08/18/2021 10:15 AM Medical Record Number: [id number] Patient Account Number: [id number] Date of Birth/Sex: [birth year] (87 y.o. F) Treating RN: Carlene Coria Primary Care Provider: Adrian Prows Other Clinician: Referring  Provider: Adrian Prows Treating Provider/Extender: Skipper Cliche in Treatment: 57 Verbal / Phone Orders: No Diagnosis Coding ICD-10 Coding Code Description 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, stage 3 I89.0 Lymphedema, not elsewhere classified I87.2 Venous insufficiency (chronic) (peripheral) Z95.0 Presence of cardiac pacemaker Z79.01 Long term (current) use of anticoagulants Follow-up Appointments o Return Appointment in 1 week. Bathing/ Shower/ Hygiene o May shower with wound dressing protected with water repellent cover or cast protector. Edema Control - Lymphedema / Segmental Compressive Device / Other o Optional: One layer of unna paste to top of compression wrap (to act as an anchor). - and at toes o Elevate, Exercise Daily and Avoid Standing for Long Periods of Time. o Elevate legs to the level of the heart and pump ankles as often as possible o Elevate leg(s) parallel to the floor when sitting. Wound Treatment Wound #7 - Malleolus Wound Laterality: Right, Lateral Cleanser: Soap and Water 1 x Per Week/30 Days Discharge Instructions: Gently cleanse wound with antibacterial soap, rinse and pat dry prior to dressing wounds Primary Dressing: Hydrofera Blue Ready Transfer Foam, 2.5x2.5 (in/in) 1 x Per Week/30 Days Discharge Instructions: Apply Hydrofera Blue Ready to wound bed as directed Secondary Dressing: ABD Pad 5x9 (in/in) 1 x Per Week/30 Days Discharge Instructions: Cover with ABD pad Compression Wrap: Profore Lite LF 3 Multilayer Compression Bandaging System 1 x Per Week/30 Days Discharge Instructions: Apply 3 multi-layer wrap as prescribed. Electronic Signature(s) Signed: 08/18/2021 5:19:10 PM By: Worthy Keeler PA-C Signed: 08/22/2021 7:55:24 AM By: Carlene Coria RN Entered By: Carlene Coria on 08/18/2021 10:54:11 Hilliker, [name] ([id number]) -------------------------------------------------------------------------------- Problem List  Details Patient Name: [name] Date of Service: 08/18/2021 10:15 AM Medical Record Number: [id number] Patient Account Number: [id number] Date of Birth/Sex: 1923-11-28 (87 y.o. F) Treating RN: Carlene Coria Primary Care Provider: Adrian Prows Other Clinician: Referring Provider: Adrian Prows Treating Provider/Extender: Skipper Cliche in Treatment: 13 Active Problems ICD-10 Encounter Code Description Active Date MDM Diagnosis 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, stage 3 05/13/2021 No Yes I89.0 Lymphedema, not elsewhere classified 05/13/2021 No Yes I87.2 Venous insufficiency (chronic) (peripheral) 05/13/2021 No Yes Z95.0 Presence of cardiac pacemaker 05/13/2021 No Yes Z79.01 Long term (current) use of anticoagulants 05/13/2021 No Yes Inactive Problems Resolved Problems Electronic Signature(s) Signed: 08/18/2021 10:14:17 AM By: Worthy Keeler PA-C Entered By: Worthy Keeler on 08/18/2021  10:14:17 Juanika, Zahm [name] ([id number]) -------------------------------------------------------------------------------- Progress Note Details Patient Name: RECHELL, VORWERK. Date of Service: 08/18/2021 10:15 AM Medical Record Number: [id number] Patient Account Number: [id number] Date of Birth/Sex: [birth year] (87 y.o. 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 Dr. Amalia Hailey the podiatrist saw her on 07/20/2017 and sent her to the wound center for possible hyperbaric oxygen therapy. past medical history of peripheral vascular disease,  varicose veins, status post appendectomy, basal cell carcinoma excision from the left leg, cholecystectomy, pacemaker placement, right lower extremity angiography done by Dr. dew in March 2017 with placement of a stent. there is also note of a successful ablation of the right small saphenous vein done which was reviewed by ultrasound on 10/24/2016. the patient had a right small saphenous vein ablation done on 10/20/2016. The patient has never been a smoker. She has been seen by Dr. Corene Cornea dew the vascular surgeon who most recently saw her on 06/15/2017 for evaluation of ongoing problems with right leg swelling. She had a lower extremity arterial duplex examination done(02/13/17) which showed patent distal right superficial femoral artery stent and above-the-knee popliteal stent without evidence of restenosis. The ABI was more than 1.3 on the right and more than 1.3 on the left. This was consistent with noncompressible arteries due to medial calcification. The right great toe pressure and PPG waveforms are within normal limits and the left great toe pressure and PPG waveforms are decreased. he recommended she continue to wear her compression stockings and continue with elevation. She is scheduled to have a noninvasive arterial study in the near future 08/16/2017 -- had a lower extremity arterial duplex examination done which showed patent distal right superficial femoral artery stent and above-the-knee popliteal stent without evidence of restenosis. The ABI was more than 1.3 on the right and more than 1.3 on the left. This was consistent with noncompressible arteries due to medial calcification. The right great toe pressure and PPG waveforms are within normal limits and the left great toe pressure and PPG waveforms are decreased. the x-ray of the right ankle has not yet been done 08/24/2017 -- had a right ankle x-ray -- IMPRESSION:1. No fracture, bone lesion or evidence of osteomyelitis. 2. Lateral soft  tissue swelling with a soft tissue ulcer. she has not yet seen the vascular surgeon for review 08/31/17 on evaluation today patient's wound appears to be showing signs of improvement. She still with her appointment with vascular in order to review her results of her vascular study and then determine if any intervention would be recommended at that time. No fevers, chills, nausea, or vomiting noted at this time. She has been tolerating the dressing changes without complication. 09/28/17 on evaluation today patient's wound appears to show signs of good improvement in regard to the granulation tissue which is surfacing. There is still a layer of slough covering the wound and the posterior portion is still significantly deeper than the anterior nonetheless there has been some good sign of things moving towards the better. She is going to go back to Dr. dew for reevaluation to ensure her blood flow is still appropriate. That will be before her next evaluation with  [place] next week. No fevers, chills, nausea, or vomiting noted at this time. Patient does have some discomfort rated to be a 3-4/10 depending on activity specifically cleansing the wound makes it worse. 10/05/2017 -- the patient was seen by Dr. Lucky Cowboy last week and noninvasive studies showed a normal right ABI with brisk triphasic waveforms consistent with no arterial insufficiency including normal digital pressures. The duplex showed a patent distal right SFA stent and the proximal SFA was also normal. He was pleased with her test and thought she should have enough of perfusion for normal wound healing. He would see her back in 6 months time. 12/21/17 on evaluation today patient appears to be doing fairly well in regard to her right lateral ankle wound. Unfortunately the main issue that she is expansion at this point is that she is having some issues with what appears to be some cellulitis in the right anterior shin. She has also been noting a little  bit of uncomfortable feeling especially last night and her ankle area. I'm afraid that she made the developing a little bit of an infection. With that being said I think it is in the early stages. 12/28/17 on evaluation today patient's ankle appears to be doing excellent. She's making good progress at this point the cellulitis seems to have improved after last week's evaluation. Overall she is having no significant discomfort which is excellent news. She does have an appointment with Dr. dew on March 29, 2018 for reevaluation in regard to the stent he placed. She seems to have excellent blood flow in the right lower extremity. 01/19/12 on evaluation today patient's wound appears to be doing very well. In fact she does not appear to require debridement at this point, there's no evidence of infection, and overall from the standpoint of the wound she seems to be doing very well. With that being said I believe that it may be time to switch to different dressing away from the [facility] Dressing she tells me she does have a lot going on her friend actually passed away yesterday and she's also having a lot of issues with her husband this obviously is weighing heavy on her as far as your thoughts and concerns today. 01/25/18 on evaluation today patient appears to be doing fairly well in regard to her right lateral malleolus. She has been tolerating the dressing changes without complication. Overall I feel like this is definitely showing signs of improvement as far as how the overall appearance of the wound is there's also evidence of epithelium start to migrate over the granulation tissue. In general I think that she is progressing nicely as far as the wound is concerned. The only concern she really has is whether or not we can switch to every other week visits in order to avoid having as many YARETHZI, VANDALEN ([id number]) appointments as her daughters have a difficult time getting her to her appointments  as well as the patient's husband to his he is not doing very well at this point. 02/22/18 on evaluation today patient's right lateral malleolus ulcer appears to be doing great. She has been tolerating the dressing changes without complication. Overall you making excellent progress at this time. Patient is having no significant discomfort. 03/15/18 on evaluation today patient appears to be doing much more poorly in regard to her right lateral ankle ulcer at this point. Unfortunately since have last seen her her husband has passed just a few days ago is obviously weighed heavily on her her  daughter also had surgery well she is with her today as usual. There does not appear to be any evidence of infection she does seem to have significant contusion/deep tissue injury to the right lateral malleolus which was not noted previous when I saw her last. It's hard to tell of exactly when this injury occurred although during the time she was spending the night in the hospital this may have been most likely. 03/22/18 on evaluation today patient appears to actually be doing very well in regard to her ulcer. She did unfortunately have a setback which was noted last week however the good news is we seem to be getting back on track and in fact the wound in the core did still have some necrotic tissue which will be addressed at this point today but in general I'm seeing signs that things are on the up and up. She is glad to hear this obviously she's been somewhat concerned that due to the how her wound digressed more recently. 03/29/18 on evaluation today patient appears to be doing fairly well in regard to her right lower extremity lateral malleolus ulcer. She unfortunately does have a new area of pressure injury over the inferior portion where the wound has opened up a little bit larger secondary to the pressure she seems to be getting. She does tell me sometimes when she sleeps at night that it actually hurts and does  seem to be pushing on the area little bit more unfortunately. There does not appear to be any evidence of infection which is good news. She has been tolerating the dressing changes without complication. She also did have some bruising in the left second and third toes due to the fact that she may have bump this or injured it although she has neuropathy so she does not feel she did move recently that may have been where this came from. Nonetheless there does not appear to be any evidence of infection at this time. 04/12/18 on evaluation today patient's wound on the right lateral ankle actually appears to be doing a little bit better with a lot of necrotic docking tissue centrally loosening up in clearing away. However she does have the beginnings of a deep tissue injury on the left lateral malleolus likely due to the fact we've been trying offload the right as much as we have. I think she may benefit from an assistive soft device to help with offloading and it looks like they're looking at one of the doughnut conditions that wraps around the lower leg to offload which I think will definitely do a good job. With that being said I think we definitely need to address this issue on the left before it becomes a wound. Patient is not having significant pain. 04/19/18 on evaluation today patient appears to be doing excellent in regard to the progress she's made with her right lateral ankle ulcer. The left ankle region which did show evidence of a deep tissue injury seems to be resolving there's little fluid noted underneath and a blister there's nothing open at this point in time overall I feel like this is progressing nicely which is good news. She does not seem to be having significant discomfort at this point which is also good news. 04/25/18-She is here in follow up evaluation for bilateral lateral malleolar ulcers. The right lateral malleolus ulcer with pale subcutaneous tissue exposure, central area of  ulcer with tendon/periosteum exposed. The left lateral malleolus ulcer now with central area of nonviable tissue,  otherwise deep tissue injury. She is wearing compression wraps to the left lower extremity, she will place the right lower extremity compression wraps on when she gets home. She will be out of town over the weekend and return next week and follow-up appointment. She completed her doxycycline this morning 05/03/18 on evaluation today patient appears to be doing very well in regard to her right lateral ankle ulcer in general. At least she's showing some signs of improvement in this regard. Unfortunately she has some additional injury to the left lateral malleolus region which appears to be new likely even over the past several days. Again this determination is based on the overall appearance. With that being said the patient is obviously frustrated about this currently. 05/10/18-She is here in follow-up evaluation for bilateral lateral malleolar ulcers. She states she has purchased offloading shoes/boots and they will arrive tomorrow. She was asked to bring them in the office at next week's appointment so her provider is aware of product being utilized. She continues to sleep on right or left side, she has been encouraged to sleep on her back. The right lateral malleolus ulcer is precariously close to peri-osteum; will order xray. The left lateral malleolus ulcer is improved. Will switch back to santyl; she will follow up next week. 05/17/18 on evaluation today patient actually appears to be doing very well in regard to her malleolus her ulcers compared to last time I saw them. She does not seem to have as much in the way of contusion at this point which is great news. With that being said she does continue to have discomfort and I do believe that she is still continuing to benefit from the offloading/pressure reducing boots that were recommended. I think this is the key to trying to get this to  heal up completely. 05/24/18 on evaluation today patient actually appears to be doing worse at this point in time unfortunately compared to her last week's evaluation. She is having really no increased pain which is good news unfortunately she does have more maceration in your theme and noted surrounding the right lateral ankle the left lateral ankle is not really is erythematous I do not see signs of the overt cellulitis on that side. Unfortunately the wounds do not seem to have shown any signs of improvement since the last evaluation. She also has significant swelling especially on the right compared to previous some of this may be due to infection however also think that she may be served better while she has these wounds by compression wrapping versus continuing to use the Juxta-Lite for the time being. Especially with the amount of drainage that she is experiencing at this point. No fevers, chills, nausea, or vomiting noted at this time. 05/31/18 on evaluation today patient appears to actually be doing better in regard to her right lateral lower extremity ulcer specifically on the malleolus region. She has been tolerating the antibiotic without complication. With that being said she still continues to have issues but a little bit of redness although nothing like she what she was experiencing previous. She still continues to pressure to her ankle area she did get the problem on offloading boots unfortunately she will not wear them she states there too uncomfortable and she can't get in and out of the bed. Nonetheless at this point her wounds seem to be continually getting worse which is not what we want I'm getting somewhat concerned about her progress and how things are going to proceed if we  do not intervene in some way shape or form. I therefore had a very lengthy conversation today about offloading yet again and even made a specific suggestion for switching her to a memory foam mattress and even  gave the information for a specific one that they could look at getting if it was something that they were interested in considering. She does not want to be considered for a hospital bed air mattress although honestly insurance would not cover it that she does not have any wounds on her trunk. 06/14/18 on evaluation today both wounds over the bilateral lateral malleolus her ulcers appear to be doing better there's no evidence of pressure injury at this point. She did get the foam mattress for her bed and this does seem to have been extremely beneficial for her in my pinion. Her daughter states that she is having difficulty getting out of bed because of how soft it is. The patient also relates this to be. Nonetheless I do feel like she's actually doing better. Unfortunately right after and around the time she was getting the mattress she also sustained a fall when she got up to go pick up the phone and ended up injuring her right elbow she has 18 sut[address] place. We are not caring for this currently although home health is going to be taking the sutures out shortly. Nonetheless this may be something that we need to evaluate going forward. It depends on KHAYLAH, TILLETT ([id number]) how well it has or has not healed in the end. She also recently saw an orthopedic specialist for an injection in the right shoulder just before her fall unfortunately the fall seems to have worsened her pain. 06/21/18 on evaluation today patient appears to be doing about the same in regard to her lateral malleolus ulcers. Both appear to be just a little bit deeper but again we are clinging away the necrotic and dead tissue which I think is why this is progressing towards a deeper realm as opposed to improving from my measurement standpoint in that regard. Nonetheless she has been tolerating the dressing changes she absolutely hates the memory foam mattress topper that was obtained for her nonetheless I do believe this is  still doing excellent as far as taking care of excess pressure in regard to the lateral malleolus regions. She in fact has no pressure injury that I see whereas in weeks past it was week by week I was constantly seeing new pressure injuries. Overall I think it has been very beneficial for her. 07/03/18; patient arrives in my clinic today. She has deep punched out areas over her bilateral lateral malleoli. The area on the right has some more depth. We spent a lot of time today talking about pressure relief for these areas. This started when her daughter asked for a prescription for a memory foam mattress. I have never written a prescription for a mattress and I don't think insurances would pay for that on an ordinary bed. In any case he came up that she has foam boots that she refuses to wear. I would suggest going to these before any other offloading issues when she is in bed. They say she is meticulous about offloading this the rest of the day 07/10/18- She is seen in follow-up evaluation for bilateral, lateral malleolus ulcers. There is no improvement in the ulcers. She has purchased and is sleeping on a memory foam mattress/overlay, she has been using the offloading boots nightly over the past week. She  has a follow up appointment with vascular medicine at the end of October, in my opinion this follow up should be expedited given her deterioration and suboptimal TBI results. We will order plain film xray of the left ankle as deeper structures are palpable; would consider having MRI, regardless of xray report(s). The ulcers will be treated with iodoflex/iodosorb, she is unable to safely change the dressings daily with santyl. 07/19/18 on evaluation today patient appears to be doing in general visually well in regard to her bilateral lateral malleolus ulcers. She has been tolerating the dressing changes without complication which is good news. With that being said we did have an x-ray performed on 07/12/18  which revealed a slight loosen see in the lateral portion of the distal left fibula which may represent artifact but underline lytic destruction or osteomyelitis could not be excluded. MRI was recommended. With that being said we can see about getting the patient scheduled for an MRI to further evaluate this area. In fact we have that scheduled currently for August 20 19,019. 07/26/18 on evaluation today patient's wound on the right lateral ankle actually appears to be doing fairly well at this point in my pinion. She has made some good progress currently. With that being said unfortunately in regard to the left lateral ankle ulcer this seems to be a little bit more problematic at this time. In fact as I further evaluated the situation she actually had bone exposed which is the first time that's been the case in the bone appear to be necrotic. Currently I did review patient's note from Dr. Bunnie Domino office with [facility]. He stated that ABI was 1.26 on the right and 0.95 on the left with good waveforms. Her perfusion is stable not reduced from previous studies and her digital waveforms were pretty good particularly on the right. His conclusion upon review of the note was that there was not much she could do to improve her perfusion and he felt she was adequate for wound healing. His suggestion was that she continued to see [place] and consider a synthetic skin graft if there was no underlying infection. He plans to see her back in six months or as needed. 08/01/18 on evaluation today patient appears to be doing better in regard to her right lateral ankle ulcer. Her left lateral ankle ulcer is about the same she still has bone involvement in evidence of necrosis. There does not appear to be evidence of infection at this time On the right lateral lower extremity. I have started her on the Augmentin she picked this up and started this yesterday. This is to get her through until she  sees infectious disease which is scheduled for 08/12/18. 08/06/18 on evaluation today patient appears to be doing rather well considering my discussion with patient's daughter at the end of last week. The area which was marked where she had erythema seems to be improved and this is good news. With that being said overall the patient seems to be making good improvement when it comes to the overall appearance of the right lateral ankle ulcer although this has been slow she at least is coming around in this regard. Unfortunately in regard to the left lateral ankle ulcer this is osteomyelitis based on the pathology report as well is bone culture. Nonetheless we are still waiting CT scan. Unfortunately the MRI we originally ordered cannot be performed as the patient is a pacemaker which I had overlooked. Nonetheless we are working on  the CT scan approval and scheduling as of now. She did go to the hospital over the weekend and was placed on IV Cefzo for a couple of days. Fortunately this seems to have improved the erythema quite significantly which is good news. There does not appear to be any evidence of worsening infection at this time. She did have some bleeding after the last debridement therefore I did not perform any sharp debridement in regard to left lateral ankle at this point. Patient has been approved for a snap vac for the right lateral ankle. 08/14/18; the patient with wounds over her bilateral lateral malleoli. The area on the right actually looks quite good. Been using a snap back on this area. Healthy granulation and appears to be filling in. Unfortunately the area on the left is really problematic. 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 Also noted to have cellulitis. She saw Dr. Novella Olive of infectious disease today and was put on linezolid. We are able to verify this with her pharmacy. She is completed the  Augmentin that she was already on. We've been using Iodoflex to this area 08/23/18 on evaluation today patient's wounds both actually appear to be doing better compared to my prior evaluations. Fortunately she showing signs of good improvement in regard to the overall wound status especially where were using the snap vac on the right. In regard to left lateral malleolus the wound bed actually appears to be much cleaner than previously noted. I do not feel any phone directly probed during evaluation today and though there is tendon noted this does not appear to be necrotic it's actually fairly good as far as the overall appearance of the tendon is concerned. In general the wound bed actually appears to be doing significantly better than it was previous. Patient is currently in the care of [name] and I did review that note today. He actually has her on two weeks of linezolid and then following the patient will be on 1-2 months of Keflex. That is the plan currently. She has been on antibiotics therapy as prescribed by myself initially starting on July 30, 2018 and has been on that continuously up to this point. 08/30/18 on evaluation today patient actually appears to be doing much better in regard to her right lateral malleolus ulcer. She has been tolerating the dressing changes specifically the snap vac without complication although she did have some issues with the seal currently. Apparently there was some trouble with getting it to maintain over the past week past Sunday. Nonetheless overall the wound appears better in regard to the right lateral malleolus region. In regard to left lateral malleolus this actually show some signs of additional granulation although there still tendon noted in the base of the wound this appears to be healthy not necrotic in any way whatsoever. We are considering potentially using a snap vac for the left lateral malleolus as well the product wrap from [employer], [employer], was  present in the clinic today we're going to see this patient I did have her come in with me after obtaining consent from the patient and her daughter in order to look at the wound and see if there's any recommendation one way or another as to whether or not they felt the snapback could be beneficial for the left lateral malleolus region. But DAMANI, BARDIN ([id number]) the conclusion was that it might be but that this is  definitely a little bit deeper wound than what traditionally would be utilized for a snap vac. 09/06/18 on evaluation today patient actually appears to be doing excellent in my pinion in regard to both ankle ulcers. She has been tolerating the dressing changes without complication which is great news. Specifically we have been using the snap vac. In regard to the right ankle I'm not even sure that this is going to be necessary for today and following as the wound has filled in quite nicely. In regard to the left ankle I do believe that we're seeing excellent epithelialization from the edge as well as granulation in the central portion the tendon is still exposed but there's no evidence of necrotic bone and in general I feel like the patient has made excellent progress even compared to last week with just one week of the snap vac. 09/11/18; this is a patient who has wounds on her bilateral lateral malleoli. Initially both of these were deep stage IV wounds in the setting of chronic arterial insufficiency. She has been revascularized. As I understand think she been using snap vacs to both of these wounds however the area on the right became more superficial and currently she is only using it on the left. Using silver collagen on the right and silver collagen under the back on the left I believe 09/19/18 on evaluation today patient actually appears to be doing very well in regard to her lateral malleolus or ulcers bilaterally. She has been tolerating the dressing changes without  complication. Fortunately there does not appear to be any evidence of infection at this time. Overall I feel like she is improving in an excellent manner and I'm very pleased with the fact that everything seems to be turning towards the better for her. This has obviously been a long road. 09/27/18 on evaluation today patient actually appears to be doing very well in regard to her bilateral lateral malleolus ulcers. She has been tolerating the dressing changes without complication. Fortunately there does not appear to be any evidence of infection at this time which is also great news. No fevers, chills, nausea, or vomiting noted at this time. Overall I feel like she is doing excellent with the snap vac on the left malleolus. She had 40 mL of fluid collection over the past week. 10/04/18 on evaluation today patient actually appears to be doing well in regard to her bilateral lateral malleolus ulcers. She continues to tolerate the dressing changes without complication. One issue that I see is the snap vac on the left lateral malleolus which appears to have sealed off some fluid underlying this area and has not really allowed it to heal to the degree that I would like to see. For that reason I did suggest at this point we may want to pack a small piece of packing strip into this region to allow it to more effectively wick out fluid. 10/11/18 in general the patient today does not feel that she has been doing very well. She's been a little bit lethargic and subsequently is having bodyaches as well according to what she tells me today. With that being said overall she has been concerned with the fact that something may be worsening although to be honest her wounds really have not been appearing poorly. She does have a new ulcer on her left heel unfortunately. This may be pressure related. Nonetheless it seems to me to have potentially started at least as a blister I do not see any evidence  of deep  tissue injury. In regard to the left ankle the snap vac still seems to be causing the ceiling off of the deeper part of the wound which is in turn trapping fluid. I'm not extremely pleased with the overall appearance as far as progress from last week to this week therefore I'm gonna discontinue the snap vac at this point. 10/18/18 patient unfortunately this point has not been feeling well for the past several days. She was seen by Grayland Ormond her primary care provider who is a Librarian, academic at [facility]. Subsequently she states that she's been very weak and generally feeling malaise. No fevers, chills, nausea, or vomiting noted at this time. With that being said bloodwork was performed at the PCP office on the 11th of this month which showed a white blood cell count of 10.7. This was repeated today and shows a white blood cell count of 12.4. This does show signs of worsening. Coupled with the fact that she is feeling worse and that her left ankle wound is not really showing signs of improvement I feel like this is an indication that the osteomyelitis is likely exacerbating not improving. Overall I think we may also want to check her C-reactive protein and sedimentation rate. Actually did call [facility] office this afternoon while the patient was in the office here with me. Subsequently based on the findings we discussed treatment possibilities and I think that it is appropriate for [place] to go ahead and initiate treatment with doxycycline which I'm going to do. Subsequently he did agree to see about adding a CRP and sedimentation rate to her orders. If that has not already been drawn to where they can run it they will contact the patient she can come back to have that check. They are in agreement with plan as far as the patient and her daughter are concerned. Nonetheless also think we need to get in touch with Dr. Henreitta Leber office to see about getting the patient scheduled with him as  soon as possible. 11/08/18 on evaluation today patient presents for follow-up concerning her bilateral foot and ankle ulcers. I did do an extensive review of her chart in epic today. Subsequently she was seen by [name] he did initiate Cefepime IV antibiotic therapy. Subsequently she had some issues with her PICC line this had to be removed because it was coiled and then replaced. Fortunately that was now settled. Unfortunately she has continued have issues with her left heel as well as the issues that she is experiencing with her bilateral lateral malleolus regions. I do believe however both areas seem to be doing a little bit better on evaluation today which is good news. No fevers, chills, nausea, or vomiting noted at this time. She actually has an angiogram schedule with Dr. dew on this coming Monday, November 11, 2018. Subsequently the patient states that she is feeling much better especially than what she was roughly 2 weeks ago. She actually had to cancel an appointment because she was feeling so poorly. No fevers, chills, nausea, or vomiting noted at this time. 11/15/18 on evaluation today patient actually is status post having had her angiogram with Dr. dew Monday, four days ago. 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 He had to go and work on several areas of the vasculature fortunately he was able to obtain no more than a 30% residual stenosis throughout post procedure. I reviewed this note today. I think this  will definitely help with healing at this time. Fortunately there does not appear to be any signs of infection and I do feel like ratio already has a better appearance to it. 11/22/18 upon evaluation today patient actually appears to be doing very well in regard to her wounds in general. The right lateral malleolus looks excellent the heel looks better in the left lateral malleolus also appears to be doing a little better. With that being said the right  second toe actually appears to be open and training we been watching this is been dry and stable but now is open. 12/03/2018 Seen today for follow-up and management of multiple bilateral lower extremity wounds. New pressure injury of the great toe which is closed at this time. Wound of the right distal second toe appears larger today with deep undermining and a pocket of fluid present within the undermining region. Left and right malleolus is wounds are stable today with no signs and symptoms of infection.Denies any needs or concerns during exam today. 12/13/18 on evaluation today patient appears to be doing somewhat better in regard to her left heel ulcer. She also seems to be completely healed in regard to the right lateral malleolus ulcer. The left malleolus ulcer is smaller what unfortunately the wounds which are new over the first and second toes of the right foot are what are most concerning at this point especially the second. Both areas did require sharp debridement today. 12/20/18 on evaluation today patient's wound actually appears to be doing better in regard to left lateral ankle and her right lateral ankle continues to remain healed. The hill ulcer on the left is improved. She does have improvement noted as well in regard to both toe ulcers. Overall I'm very pleased in this regard. No fevers, chills, nausea, or vomiting noted at this time. CLAIRESE, SNEATHEN ([id number]) 12/23/18 on evaluation today patient is seen after she had her toenails trimmed at the podiatrist office due to issues with her right great toe. There was what appeared to be dark eschar on the surface of the wound which had her in the podiatrist concerned. Nonetheless as I remember that during the last office visit I had utilize silver nitrate of this area I was much less concerned about the situation. Subsequently I was able to clean off much of this tissue without any complication today. This does not appear to show any  signs of infection and actually look somewhat better compared to last time post debridement. Her second toe on the right foot actually had callous over and there did appear still be some fluid underneath this that would require debridement today. 12/27/18 on evaluation today patient actually appears to be showing signs of improvement at all locations. Even the left lateral ankle although this is not quite as great as the other sites. Fortunately there does not appear to be any signs of infection at this time and both of her toes on the right foot seem to be showing signs of improvement which is good news and very pleased in this regard. 01/03/19 on evaluation today patient appears to be doing better for the most part in regard to her wounds in particular. There does not appear to be any evidence of infection at this time which is good news. Fortunately there is no sign of really worsening anywhere except for the right great toe which she does have what appears to be a bruise/deep tissue injury which is very superficial and already resolving. I'm not sure  where this came from I questioned her extensively and she does not recall what may have happened with this. Other than that the patient seems to be doing well even the left lateral ankle ulcer looks good and is getting smaller. 01/10/19 on evaluation today patient appears to be doing well in regard to her left heel wound and both of her toe wounds. Overall I feel like there is definitely improvement here and I'm happy in that regard. With that being said unfortunately she is having issues with the left lateral malleolus ulcer which unfortunately still has a lot of depth to it. This is gonna be a very difficult wound for [place] to be able to truly get to heal. I may want to consider some type of skin substitute to see if this would be of benefit for her. I'll discuss this with her more the next visit most likely. This was something I thought about more at the  end of the visit when I was Artie out of the room and the patient had been discharged. 01/17/19 on evaluation today patient appears to be doing very well in regard to her wounds in general. She's been making excellent progress at this time. Fortunately there's no sign of infection at this time either. No fevers, chills, nausea, or vomiting noted at this time. The biggest issue is still her left lateral malleolus where it appears to be doing well and is getting smaller but still shows a small corner where this is deeper and goes down into what appears to be the joint space. Nonetheless this is taking much longer to heal although it still looks better in smaller than previous evaluations. 01/24/19 on evaluation today patient's wounds actually appear to be doing rather well in general overall. She did require some sharp debridement in regard to the right great toe but everything else appears to be doing excellent no debridement was even necessary. No fevers, chills, nausea, or vomiting noted at this time. 01/31/19 on evaluation today patient actually appears to be doing much better in regard to her left foot wound on the heel as well as the ankle. The right great toe appears to be a little bit worse today this had callous over and trapped a lot of fluid underneath. Fortunately there's no signs of infection at any site which is great news. 02/07/19 on evaluation today patient actually appears to be doing decently well in regard to all of her ulcers at this point. No sharp debridement was required she is a little bit of hyper granulation in regard to the left lateral ankle as well as the left heel but the hill itself is almost completely healed which is excellent news. Overall been very pleased in this regard. 02/14/19 on evaluation today patient actually appears to be doing very well in regard to her ulcers on the right first toe, left lateral malleolus, and left heel. In fact the heel is almost completely  healed at this point. The patient does not show any signs of infection which is good news. Overall very pleased with how things have progressed. 04/18/19 Telehealth Evaluation During the COVID-19 National Emergency: Verbal Consent: Obtained from patient Allergies: reviewed and the active list is current. Medication changes: patient has no current medication changes. COVID-19 Screening: 1. Have you traveled internationally or on a cruise ship in the last 14 dayso No 2. Have you had contact with someone with or under investigation for COVID-19o No 3. Have you had a fever, cough, sore throat, or  experiencing shortness of breatho No on evaluation today actually did have a visit with this patient through a telehealth encounter with her home health nurse. Subsequently it was noted that the patient actually appears to be doing okay in regard to her wounds both the right great toe as well as the left lateral malleolus have shown signs of improvement although this in your theme around the left lateral malleolus there eschar coverings for both locations. The question is whether or not they are actually close and whether or not home health needs to discharge the patient or not. Nonetheless my concern is this point obviously is that without actually seeing her and being able to evaluate this directly I cannot ensure that she is completely healed which is the question that I'm being asked. 04/22/19 on evaluation today patient presents for her first evaluation since last time I saw her which was actually February 14, 2019. I did do a telehealth visit last week in which point it was questionable whether or not she may be healed and had to bring her in today for confirmation. With that being said she does seem to be doing quite well at this point which is good news. There does not appear to be any drainage in the deed I believe her wounds may be healed. Readmission: 09/04/2019 on evaluation today patient appears to  be doing unfortunately somewhat more poorly in regard to her left foot ulcer secondary to a wound that began on 08/21/2019 at least when she first noticed this. Fortunately she has not had any evidence of active infection at this time. Systemically. I also do not necessarily see any evidence of infection at the blister/wound site on the first metatarsal head plantar aspect. This almost appears to be something that may have just rubbed inappropriately causing this to breakdown. They did not want a wait too long to come in to be seen as again she had significant issues in the past with wounds that took quite a while to heal in fact it was close to 2 years. Nonetheless this does not appear to be quite that bad but again we do need to remove some of the necrotic tissue from the surface of the wound to tell exactly the extent. She does not appear to have any significant arterial disease at this point and again her last ABIs and TBI's are recorded above in the alert section her left ABI was 1.27 with a TBI of 0.72 to the right ABI 1.08 with a TBI of 0.39. Other than this the patient has been doing quite MONCHEL, ATALLA ([id number]) well since I last saw her and that was in May 2020. 09/11/2019 on evaluation today patient appeared to be doing very well with regard to her plantar foot ulcer on the left. In fact this appears to be almost completely healed which is awesome. That is after just 1 week of intervention. With that being said there is no signs of active infection at this time. 09/18/2019 on evaluation today patient actually appears to be doing excellent in fact she is completely healed based on what I am seeing at this point. Fortunately there is no signs of active infection at this time and overall patient is very pleased to hear that this area has healed so quickly. Readmission: 05/13/2021 upon evaluation today this patient presents for reevaluation here in the clinic. This is a wound that  actually we previously took care of. She had 1 on the right ankle and the left  the left turned out to be be harder due to to heal but nonetheless is doing great at this point as the right that has reopened and it was noted first just several weeks ago with a scab over it and came off in just the past few days. Fortunately there does not appear to be any obvious evidence of significant active infection at this time which is great news. No fevers, chills, nausea, vomiting, or diarrhea. 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 There does not appear to be any signs of this interfering in any way with her wound. She does have swelling we previously had compression socks for her ordered but again it does not look like she wears these on a regular basis by any means. 05/26/2021 upon evaluation today patient appears to be doing well with regard to her wound which is actually showing signs of excellent improvement. There does not appear to be any signs of active infection which is great news and overall very pleased with where things stand today. No fevers, chills, nausea, vomiting, or diarrhea. 06/02/2021 upon evaluation today patient's wound actually showing signs of excellent improvement. Fortunately there does not appear to be any signs of active infection which is great news. I think the patient is making good progress with regard to her wounds in general. 06/09/2021 upon evaluation today patient appears to be doing excellent in regard to her wounds currently. Fortunately there does not appear to be any signs of active infection which is great news. No fevers, chills, nausea, vomiting, or diarrhea. Overall extremely pleased with where things stand today. I think the patient is making excellent progress. 06/16/2021 upon evaluation today patient appears to be doing well in regard to her wound. This is going require little bit of debridement today and that was  discussed with the patient. Otherwise she seems to be doing quite well and I am actually very pleased with where things stand at this point. No fevers, chills, nausea, vomiting, or diarrhea. 06/23/2021 upon evaluation today patient appears to be doing well with regard to her wounds. She has been tolerating the dressing changes without complication. Fortunately there does not appear to be any evidence of infection and she has not had air in her home which she actually lives at an assisted living that got fixed this morning. With that being said because of that her wrap has been extremely hot and bothersome for her over the past week. 06/30/2021 upon evaluation today patient is actually making excellent progress in regard to her ankle ulcer. She has been tolerating the dressing changes without complication and overall extremely pleased with where things stand there does not appear to be any evidence of active infection which is great news. No fevers, chills, nausea, vomiting, or diarrhea. 07/07/21 upon evaluation today patients and culture on the right actually appears to be doing quite well. There does not appear to be any signs of infection and overall very pleased with where things stand today. No fevers, chills, nausea, or vomiting noted at this time. 07/14/2021 unfortunately the patient today has some evidence of deep tissue injury and pressure getting to the ankle region. Again I am not exactly sure what is going on here but this is very similar to issues that we have had in the past. I explained to the patient that she needs to be very mindful of exactly what is happening I think sleeping in bed is probably the main  issue here although there could be other culprits I am not sure what else would potentially lead to this kind of a problem for her. 07/21/2021 upon evaluation today patient's wound actually showing signs of improvement compared to last week. Fortunately there does not appear to be any  signs of active infection which is great news and overall very pleased with where things stand in that regard. With that being said I do believe that she is continuing to show signs of overall of getting better although I think this is still basically about what we were 2 weeks ago due to the worsening and now improvement. 07/28/2021 upon evaluation today patient appears to be doing well with regard to her wound. She does have some slough buildup on the surface of the wound which I would have to manage today. Fortunately there is no sign of active infection at this time. No fevers, chills, nausea, vomiting, or diarrhea. 08/04/2021 upon evaluation today patient appears to be doing about the same in regard to her wound. To be perfectly honest I am beginning to be a little bit concerned about the overall appearance of the wound bed. I do think possibly taking a sample right around the margin of the wound could be beneficial for her as far as identifying anything such as an inflammatory process or to be honest even a skin tag cancer type process that may be of concern here. Fortunately there does not appear to be any evidence of active infection at this time which is great news she is not having any pain also great news. 08/11/2021 upon evaluation today patient appears to be doing well with regard to her wound. The good news is I did review her biopsy results and it showed some inflammatory mixed findings but nothing that appeared to be malignant which is great news. Overall this is more of a chronic venous stasis type issue which again is more what we have been treating. Nonetheless I just wanted to make sure before going forward that there was not anything more untoward going on at this point. 08/18/2021 upon evaluation today patient appears to be doing well with regard to her ankle ulcer. Fortunately there does not appear to be any signs of active infection at this time which is great overall wound is  dramatically improved compared to last week. Since last week I have actually placed her on doxycycline and subsequently this is a good option as far as the findings are concerned at this point. 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 the doxycycline is doing that. There does not appear to be any evidence of active infection systemically which is great news. MITA, PETRAKIS ([id number]) Objective Constitutional Well-nourished and well-hydrated in no acute distress. Vitals Time Taken: 10:43 AM, Height: 62 in, Weight: 150 lbs, BMI: 27.4, Temperature: 98 F, Pulse: 72 bpm, Respiratory Rate: 18 breaths/min, Blood Pressure: 138/74 mmHg. Respiratory normal breathing without difficulty. Psychiatric this patient is able to make decisions and demonstrates good insight into disease process. Alert and Oriented x 3. pleasant and cooperative. General Notes: Upon inspection patient's wound bed actually showed signs of better granulation epithelization than what we have seen for some time. There does not appear to be any evidence of active infection at this time which is great news and overall I think that the doxycycline is doing a great  job. No sharp debridement was performed today. Integumentary (Hair, Skin) Wound #7 status is Open. Original cause of wound was Gradually Appeared. The date acquired was: 05/12/2021. The wound has been in treatment 13 weeks. The wound is located on the Right,Lateral Malleolus. The wound measures 1.5cm length x 0.6cm width x 0.3cm depth; 0.707cm^2 area and 0.212cm^3 volume. There is Fat Layer (Subcutaneous Tissue) exposed. There is no tunneling or undermining noted. There is a medium amount of serosanguineous drainage noted. The wound margin is distinct with the outline attached to the wound base. There is small (1-33%) red, pink, pale granulation  within the wound bed. There is a large (67-100%) amount of necrotic tissue within the wound bed. Assessment Active Problems ICD-10 Pressure ulcer of right ankle, stage 3 Lymphedema, not elsewhere classified Venous insufficiency (chronic) (peripheral) Presence of cardiac pacemaker Long term (current) use of anticoagulants Procedures Wound #7 Pre-procedure diagnosis of Wound #7 is a Pressure Ulcer located on the Right,Lateral Malleolus . There was a Three Layer Compression Therapy Procedure by Carlene Coria, RN. Post procedure Diagnosis Wound #7: Same as Pre-Procedure Plan Follow-up Appointments: Return Appointment in 1 week. Bathing/ Shower/ Hygiene: May shower with wound dressing protected with water repellent cover or cast protector. Edema Control - Lymphedema / Segmental Compressive Device / Other: Optional: One layer of unna paste to top of compression wrap (to act as an anchor). - and at toes Elevate, Exercise Daily and Avoid Standing for Long Periods of Time. Elevate legs to the level of the heart and pump ankles as often as possible Pretlow, [name] ([id number]) Elevate leg(s) parallel to the floor when sitting. WOUND #7: - Malleolus Wound Laterality: Right, Lateral Cleanser: Soap and Water 1 x Per Week/30 Days Discharge Instructions: Gently cleanse wound with antibacterial soap, rinse and pat dry prior to dressing wounds Primary Dressing: Hydrofera Blue Ready Transfer Foam, 2.5x2.5 (in/in) 1 x Per Week/30 Days Discharge Instructions: Apply Hydrofera Blue Ready to wound bed as directed Secondary Dressing: ABD Pad 5x9 (in/in) 1 x Per Week/30 Days Discharge Instructions: Cover with ABD pad Compression Wrap: Profore Lite LF 3 Multilayer Compression Bandaging System 1 x Per Week/30 Days Discharge Instructions: Apply 3 multi-layer wrap as prescribed. 1. Would recommend that we going continue with wound care measures as before and the patient is in agreement with the plan. This  includes the use of the doxycycline which I think is still doing awesome for her. 2. I am also can recommend at this time that we have the patient continue to elevate her legs much as possible and wear compression sock on the left leg that seems to also be doing awesome for her. We will see patient back for reevaluation in 1 week here in the clinic. If anything worsens or changes patient will contact our office for additional recommendations. Electronic Signature(s) Signed: 08/18/2021 4:01:33 PM By: Worthy Keeler PA-C Entered By: Worthy Keeler on 08/18/2021 16:01:33 Torosian, [name] ([id number]) -------------------------------------------------------------------------------- SuperBill Details Patient Name: [name] Date of Service: 08/18/2021 Medical Record Number: [id number] Patient Account Number: [id number] Date of Birth/Sex: [birth year] (87 y.o. F) Treating RN: Carlene Coria Primary Care Provider: Adrian Prows Other Clinician: Referring Provider: Adrian Prows Treating Provider/Extender: Skipper Cliche in Treatment: 13 Diagnosis Coding ICD-10 Codes Code Description [phone] Pressure ulcer of right ankle, stage 3 I89.0 Lymphedema, not elsewhere classified I87.2 Venous insufficiency (chronic) (peripheral) Z95.0 Presence of cardiac pacemaker Z79.01 Long term (current) use of anticoagulants Facility Procedures CPT4 Code: [id number] Description: (Facility Use  Only) [id number] - APPLY MULTLAY COMPRS LWR RT LEG Modifier: Quantity: 1 Physician Procedures CPT4 Code: [id number] Description: [id number] - WC PHYS LEVEL 4 - EST PT Modifier: Quantity: 1 CPT4 Code: Description: ICD-10 Diagnosis Description 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, stage 3 I89.0 Lymphedema, not elsewhere classified I87.2 Venous insufficiency (chronic) (peripheral) Z95.0 Presence of cardiac pacemaker Modifier: Quantity: Electronic Signature(s) Signed: 08/18/2021 4:01:53 PM By: Worthy Keeler  PA-C Entered By: Worthy Keeler on 08/18/2021 16:01:53

## 2021-08-19 NOTE — Progress Notes (Addendum)
[name], [name] ([id number]) Visit Report for 08/18/2021 Arrival Information Details Patient Name: [name] Date of Service: 08/18/2021 10:15 AM Medical Record Number: [id number] Patient Account Number: [id number] Date of Birth/Sex: 05/11/1923 (87 y.o. F) Treating RN: Carlene Coria Primary Care [name]: Adrian Prows Other Clinician: Referring Kynsley Whitehouse: Adrian Prows Treating Alfredo Spong/Extender: Skipper Cliche in Treatment: 13 Visit Information History Since Last Visit All ordered tests and consults were completed: No Patient Arrived: [name] Added or deleted any medications: No Arrival Time: 10:32 Any new allergies or adverse reactions: No Accompanied By: daughter Had a fall or experienced change in No Transfer Assistance: None activities of daily living that may affect Patient Identification Verified: Yes risk of falls: Secondary Verification Process Completed: Yes Signs or symptoms of abuse/neglect since last visito No Patient Requires Transmission-Based No Hospitalized since last visit: No Precautions: Implantable device outside of the clinic excluding No Patient Has Alerts: Yes cellular tissue based products placed in the center Patient Alerts: Patient on Blood since last visit: Thinner Has Dressing in Place as Prescribed: Yes NOT DIABETIC Has Compression in Place as Prescribed: Yes ***ELIQUIS*** Pain Present Now: No Electronic Signature(s) Signed: 08/22/2021 7:55:24 AM By: Carlene Coria RN Entered By: Carlene Coria on 08/18/2021 10:42:53 [name] ([id number]) -------------------------------------------------------------------------------- Clinic Level of Care Assessment Details Patient Name: [name] Date of Service: 08/18/2021 10:15 AM Medical Record Number: [id number] Patient Account Number: [id number] Date of Birth/Sex: 05/22/1923 (87 y.o. F) Treating RN: Carlene Coria Primary Care [place] Souter: Adrian Prows Other  Clinician: Referring Festus Pursel: Adrian Prows Treating Aneeka Bowden/Extender: Skipper Cliche in Treatment: 13 Clinic Level of Care Assessment Items TOOL 1 Quantity Score '[]'  - Use when EandM and Procedure is performed on INITIAL visit 0 ASSESSMENTS - Nursing Assessment / Reassessment '[]'  - General Physical Exam (combine w/ comprehensive assessment (listed just below) when performed on new 0 pt. evals) '[]'  - 0 Comprehensive Assessment (HX, ROS, Risk Assessments, Wounds Hx, etc.) ASSESSMENTS - Wound and Skin Assessment / Reassessment '[]'  - Dermatologic / Skin Assessment (not related to wound area) 0 ASSESSMENTS - Ostomy and/or Continence Assessment and Care '[]'  - Incontinence Assessment and Management 0 '[]'  - 0 Ostomy Care Assessment and Management (repouching, etc.) PROCESS - Coordination of Care '[]'  - Simple Patient / Family Education for ongoing care 0 '[]'  - 0 Complex (extensive) Patient / Family Education for ongoing care '[]'  - 0 Staff obtains Programmer, systems, Records, [name] Results / Process Orders '[]'  - 0 Staff telephones HHA, Nursing Homes / Clarify orders / etc '[]'  - 0 Routine Transfer to another Facility (non-emergent condition) '[]'  - 0 Routine Hospital Admission (non-emergent condition) '[]'  - 0 New Admissions / Biomedical engineer / Ordering NPWT, Apligraf, etc. '[]'  - 0 Emergency Hospital Admission (emergent condition) PROCESS - Special Needs '[]'  - Pediatric / Minor Patient Management 0 '[]'  - 0 Isolation Patient Management '[]'  - 0 Hearing / Language / Visual special needs '[]'  - 0 Assessment of Community assistance (transportation, D/C planning, etc.) '[]'  - 0 Additional assistance / Altered mentation '[]'  - 0 Support Surface(s) Assessment (bed, cushion, seat, etc.) INTERVENTIONS - Miscellaneous '[]'  - External ear exam 0 '[]'  - 0 Patient Transfer (multiple staff / Civil Service fast streamer / Similar devices) '[]'  - 0 Simple Staple / Suture removal (25 or less) '[]'  - 0 Complex Staple / Suture removal  (26 or more) '[]'  - 0 Hypo/Hyperglycemic Management (do not check if billed separately) '[]'  - 0 Ankle / Brachial Index (ABI) - do not check if billed separately Has the  patient been seen at the hospital within the last three years: Yes Total Score: 0 Level Of Care: ____ [name] ([id number]) Electronic Signature(s) Signed: 08/22/2021 7:55:24 AM By: Carlene Coria RN Entered By: Carlene Coria on 08/18/2021 10:55:15 [name] ([id number]) -------------------------------------------------------------------------------- Compression Therapy Details Patient Name: [name] Date of Service: 08/18/2021 10:15 AM Medical Record Number: [id number] Patient Account Number: [id number] Date of Birth/Sex: 1923/03/30 (87 y.o. F) Treating RN: Carlene Coria Primary Care Lynee Rosenbach: Adrian Prows Other Clinician: Referring Rose Hippler: Adrian Prows Treating Rivers Hamrick/Extender: Skipper Cliche in Treatment: 13 Compression Therapy Performed for Wound Assessment: Wound #7 Right,Lateral Malleolus Performed By: Jake Church, RN Compression Type: Three Layer Post Procedure Diagnosis Same as Pre-procedure Electronic Signature(s) Signed: 08/22/2021 7:55:24 AM By: Carlene Coria RN Entered By: Carlene Coria on 08/18/2021 10:53:35 [name] ([id number]) -------------------------------------------------------------------------------- Encounter Discharge Information Details Patient Name: [name] Date of Service: 08/18/2021 10:15 AM Medical Record Number: [id number] Patient Account Number: [id number] Date of Birth/Sex: 11/02/1923 (87 y.o. F) Treating RN: Carlene Coria Primary Care Tallin Hart: Adrian Prows Other Clinician: Referring Raiden Haydu: Adrian Prows Treating Gayatri Teasdale/Extender: Skipper Cliche in Treatment: 13 Encounter Discharge Information Items Discharge Condition: Stable Ambulatory Status: Walker Discharge Destination:  Home Transportation: Private Auto Accompanied By: daughter Schedule Follow-up Appointment: Yes Clinical Summary of Care: Patient Declined Electronic Signature(s) Signed: 08/22/2021 7:55:24 AM By: Carlene Coria RN Entered By: Carlene Coria on 08/18/2021 10:57:04 Cobert, [name] ([id number]) -------------------------------------------------------------------------------- Lower Extremity Assessment Details Patient Name: [name] Date of Service: 08/18/2021 10:15 AM Medical Record Number: [id number] Patient Account Number: [id number] Date of Birth/Sex: October 29, 1923 (87 y.o. F) Treating RN: Carlene Coria Primary Care Atasha Colebank: Adrian Prows Other Clinician: Referring Jamael Hoffmann: Adrian Prows Treating Tyee Vandevoorde/Extender: Skipper Cliche in Treatment: 13 Edema Assessment Assessed: [Left: No] Patrice Paradise: No] [Left: Edema] [Right: :] Calf Left: Right: Point of Measurement: 33 cm From Medial Instep 29 cm Ankle Left: Right: Point of Measurement: 10 cm From Medial Instep 17 cm Vascular Assessment Pulses: Dorsalis Pedis Palpable: [Right:Yes] Electronic Signature(s) Signed: 08/22/2021 7:55:24 AM By: Carlene Coria RN Entered By: Carlene Coria on 08/18/2021 10:47:37 Deshong, [name] ([id number]) -------------------------------------------------------------------------------- Multi Wound Chart Details Patient Name: [name] Date of Service: 08/18/2021 10:15 AM Medical Record Number: [id number] Patient Account Number: [id number] Date of Birth/Sex: 01-29-1923 (87 y.o. F) Treating RN: Carlene Coria Primary Care Oral Remache: Adrian Prows Other Clinician: Referring Jadarrius Maselli: Adrian Prows Treating Theona Muhs/Extender: Skipper Cliche in Treatment: 13 Vital Signs Height(in): 62 Pulse(bpm): 23 Weight(lbs): 150 Blood Pressure(mmHg): 138/74 Body Mass Index(BMI): 27 Temperature(F): 98 Respiratory Rate(breaths/min): 18 Photos: [N/A:N/A] Wound Location: Right,  Lateral Malleolus N/A N/A Wounding Event: Gradually Appeared N/A N/A Primary Etiology: Pressure Ulcer N/A N/A Comorbid History: Cataracts, Congestive Heart Failure, N/A N/A Hypertension, Peripheral Arterial Disease, Osteoarthritis, Neuropathy Date Acquired: 05/12/2021 N/A N/A Weeks of Treatment: 13 N/A N/A Wound Status: Open N/A N/A Measurements L x W x D (cm) 1.5x0.6x0.3 N/A N/A Area (cm) : 0.707 N/A N/A Volume (cm) : 0.212 N/A N/A % Reduction in Area: 37.00% N/A N/A % Reduction in Volume: 5.80% N/A N/A Classification: Category/Stage III N/A N/A Exudate Amount: Medium N/A N/A Exudate Type: Serosanguineous N/A N/A Exudate Color: red, brown N/A N/A Wound Margin: Distinct, outline attached N/A N/A Granulation Amount: Small (1-33%) N/A N/A Granulation Quality: Red, Pink, Pale N/A N/A Necrotic Amount: Large (67-100%) N/A N/A Exposed Structures: Fat Layer (Subcutaneous Tissue): N/A N/A Yes Fascia: No Tendon: No Muscle: No Joint: No Bone: No Epithelialization: Small (1-33%) N/A  N/A Treatment Notes Electronic Signature(s) Signed: 08/22/2021 7:55:24 AM By: Carlene Coria RN Entered By: Carlene Coria on 08/18/2021 10:53:12 Bradish, [name] ([id number]) -------------------------------------------------------------------------------- Price Details Patient Name: [name] Date of Service: 08/18/2021 10:15 AM Medical Record Number: [id number] Patient Account Number: [id number] Date of Birth/Sex: 26-Feb-1923 (87 y.o. F) Treating RN: Carlene Coria Primary Care Jaide Hillenburg: Adrian Prows Other Clinician: Referring Tashae Inda: Adrian Prows Treating Tryton Bodi/Extender: Skipper Cliche in Treatment: 13 Active Inactive Wound/Skin Impairment Nursing Diagnoses: Knowledge deficit related to ulceration/compromised skin integrity Goals: Patient/caregiver will verbalize understanding of skin care regimen Date Initiated: 05/13/2021 Date Inactivated:  05/13/2021 Target Resolution Date: 06/12/2021 Goal Status: Met Ulcer/skin breakdown will have a volume reduction of 30% by week 4 Date Initiated: 05/13/2021 Date Inactivated: 06/16/2021 Target Resolution Date: 06/12/2021 Goal Status: Met Ulcer/skin breakdown will have a volume reduction of 50% by week 8 Date Initiated: 05/13/2021 Date Inactivated: 08/18/2021 Target Resolution Date: 07/13/2021 Goal Status: Unmet Unmet Reason: comorbities Ulcer/skin breakdown will have a volume reduction of 80% by week 12 Date Initiated: 05/13/2021 Date Inactivated: 08/18/2021 Target Resolution Date: 08/13/2021 Goal Status: Unmet Unmet Reason: comorbities Ulcer/skin breakdown will heal within 14 weeks Date Initiated: 05/13/2021 Target Resolution Date: 09/12/2021 Goal Status: Active Interventions: Assess patient/caregiver ability to obtain necessary supplies Assess patient/caregiver ability to perform ulcer/skin care regimen upon admission and as needed Assess ulceration(s) every visit Notes: Electronic Signature(s) Signed: 08/22/2021 7:55:24 AM By: Carlene Coria RN Entered By: Carlene Coria on 08/18/2021 10:52:55 Baugh, [name] ([id number]) -------------------------------------------------------------------------------- Pain Assessment Details Patient Name: [name] Date of Service: 08/18/2021 10:15 AM Medical Record Number: [id number] Patient Account Number: [id number] Date of Birth/Sex: [birth year] (87 y.o. F) Treating RN: Carlene Coria Primary Care Carolyna Yerian: Adrian Prows Other Clinician: Referring Caidyn Blossom: Adrian Prows Treating Veronique Warga/Extender: Skipper Cliche in Treatment: 13 Active Problems Location of Pain Severity and Description of Pain Patient Has Paino No Site Locations Pain Management and Medication Current Pain Management: Electronic Signature(s) Signed: 08/22/2021 7:55:24 AM By: Carlene Coria RN Entered By: Carlene Coria on 08/18/2021 10:44:02 Bartell, [name]  ([id number]) -------------------------------------------------------------------------------- Patient/Caregiver Education Details Patient Name: [name] Date of Service: 08/18/2021 10:15 AM Medical Record Number: [id number] Patient Account Number: [id number] Date of Birth/Gender: 03-09-1923 (87 y.o. F) Treating RN: Carlene Coria Primary Care Physician: Adrian Prows Other Clinician: Referring Physician: Adrian Prows Treating Physician/Extender: Skipper Cliche in Treatment: 13 Education Assessment Education Provided To: Patient Education Topics Provided Wound/Skin Impairment: Methods: Explain/Verbal Responses: State content correctly Electronic Signature(s) Signed: 08/22/2021 7:55:24 AM By: Carlene Coria RN Entered By: Carlene Coria on 08/18/2021 10:56:11 Riesgo, [name] ([id number]) -------------------------------------------------------------------------------- Wound Assessment Details Patient Name: [name] Date of Service: 08/18/2021 10:15 AM Medical Record Number: [id number] Patient Account Number: [id number] Date of Birth/Sex: 1922/12/31 (87 y.o. F) Treating RN: Carlene Coria Primary Care Ademide Schaberg: Adrian Prows Other Clinician: Referring Leighton Luster: Adrian Prows Treating Sabino Denning/Extender: Skipper Cliche in Treatment: 13 Wound Status Wound Number: 7 Primary Pressure Ulcer Etiology: Wound Location: Right, Lateral Malleolus Wound Open Wounding Event: Gradually Appeared Status: Date Acquired: 05/12/2021 Comorbid Cataracts, Congestive Heart Failure, Hypertension, Weeks Of Treatment: 13 History: Peripheral Arterial Disease, Osteoarthritis, Neuropathy Clustered Wound: No Photos Wound Measurements Length: (cm) 1.5 Width: (cm) 0.6 Depth: (cm) 0.3 Area: (cm) 0.707 Volume: (cm) 0.212 % Reduction in Area: 37% % Reduction in Volume: 5.8% Epithelialization: Small (1-33%) Tunneling: No Undermining: No Wound  Description Classification: Category/Stage III Wound Margin: Distinct, outline attached Exudate Amount: Medium Exudate Type: Serosanguineous Exudate Color: red, brown  Foul Odor After Cleansing: No Slough/Fibrino Yes Wound Bed Granulation Amount: Small (1-33%) Exposed Structure Granulation Quality: Red, Pink, Pale Fascia Exposed: No Necrotic Amount: Large (67-100%) Fat Layer (Subcutaneous Tissue) Exposed: Yes Tendon Exposed: No Muscle Exposed: No Joint Exposed: No Bone Exposed: No Treatment Notes Wound #7 (Malleolus) Wound Laterality: Right, Lateral Cleanser Soap and Water Discharge Instruction: Gently cleanse wound with antibacterial soap, rinse and pat dry prior to dressing wounds Peri-Wound Care Tangen, [name] ([id number]) Topical Primary Dressing Hydrofera Blue Ready Transfer Foam, 2.5x2.5 (in/in) Discharge Instruction: Apply Hydrofera Blue Ready to wound bed as directed Secondary Dressing ABD Pad 5x9 (in/in) Discharge Instruction: Cover with ABD pad Secured With Compression Wrap Profore Lite LF 3 Multilayer Compression Bandaging System Discharge Instruction: Apply 3 multi-layer wrap as prescribed. Compression Stockings Add-Ons Electronic Signature(s) Signed: 08/22/2021 7:55:24 AM By: Carlene Coria RN Entered By: Carlene Coria on 08/18/2021 10:45:01 Babin, [name] ([id number]) -------------------------------------------------------------------------------- Vitals Details Patient Name: [name] Date of Service: 08/18/2021 10:15 AM Medical Record Number: [id number] Patient Account Number: [id number] Date of Birth/Sex: 16-Aug-1923 (87 y.o. F) Treating RN: Carlene Coria Primary Care Bannon Giammarco: Adrian Prows Other Clinician: Referring Janita Camberos: Adrian Prows Treating Jamar Weatherall/Extender: Skipper Cliche in Treatment: 13 Vital Signs Time Taken: 10:43 Temperature (F): 98 Height (in): 62 Pulse (bpm): 72 Weight (lbs): 150 Respiratory Rate  (breaths/min): 18 Body Mass Index (BMI): 27.4 Blood Pressure (mmHg): 138/74 Reference Range: 80 - 120 mg / dl Electronic Signature(s) Signed: 08/22/2021 7:55:24 AM By: Carlene Coria RN Entered By: Carlene Coria on 08/18/2021 10:43:51

## 2021-08-25 ENCOUNTER — Encounter: Payer: Medicare Other | Admitting: Physician Assistant

## 2021-08-25 ENCOUNTER — Other Ambulatory Visit: Payer: Self-pay

## 2021-08-25 DIAGNOSIS — L89513 Pressure ulcer of right ankle, stage 3: Secondary | ICD-10-CM | POA: Diagnosis not present

## 2021-08-25 NOTE — Progress Notes (Addendum)
BRITT, THEARD ([id number]) Visit Report for 08/25/2021 Chief Complaint Document Details Patient Name: [name], [name]. Date of Service: 08/25/2021 10:15 AM Medical Record Number: [id number] Patient Account Number: [id number] Date of Birth/Sex: 07/18/1923 (87 y.o. F) Treating RN: Carlene Coria Primary Care Provider: Adrian Prows Other Clinician: Referring Provider: Adrian Prows Treating Provider/Extender: Skipper Cliche in Treatment: 14 Information Obtained from: Patient Chief Complaint Right foot ulcer Electronic Signature(s) Signed: 08/25/2021 10:41:41 AM By: Worthy Keeler PA-C Entered By: Worthy Keeler on 08/25/2021 10:41:41 Pecore, [name] ([id number]) -------------------------------------------------------------------------------- Debridement Details Patient Name: [name] Date of Service: 08/25/2021 10:15 AM Medical Record Number: [id number] Patient Account Number: [id number] Date of Birth/Sex: 10-Jun-1923 (87 y.o. F) Treating RN: Carlene Coria Primary Care Provider: Adrian Prows Other Clinician: Referring Provider: Adrian Prows Treating Provider/Extender: Skipper Cliche in Treatment: 14 Debridement Performed for Wound #7 Right,Lateral Malleolus Assessment: Performed By: Physician Tommie Sams., PA-C Debridement Type: Debridement Level of Consciousness (Pre- Awake and Alert procedure): Pre-procedure Verification/Time Out Yes - 10:45 Taken: Start Time: 10:45 Total Area Debrided (L x W): 1 (cm) x 1 (cm) = 1 (cm) Tissue and other material Viable, Non-Viable, Slough, Subcutaneous, Skin: Dermis , Skin: Epidermis, Slough debrided: Level: Skin/Subcutaneous Tissue Debridement Description: Excisional Instrument: Curette Bleeding: Minimum Hemostasis Achieved: Pressure End Time: 10:49 Procedural Pain: 0 Post Procedural Pain: 0 Response to Treatment: Procedure was tolerated well Level of Consciousness (Post- Awake and  Alert procedure): Post Debridement Measurements of Total Wound Length: (cm) 1 Stage: Category/Stage III Width: (cm) 1 Depth: (cm) 0.3 Volume: (cm) 0.236 Character of Wound/Ulcer Post Debridement: Improved Post Procedure Diagnosis Same as Pre-procedure Electronic Signature(s) Signed: 08/25/2021 1:17:18 PM By: Carlene Coria RN Signed: 08/25/2021 2:21:21 PM By: Worthy Keeler PA-C Entered By: Carlene Coria on 08/25/2021 10:48:45 Vultaggio, [name] ([id number]) -------------------------------------------------------------------------------- HPI Details Patient Name: [name] Date of Service: 08/25/2021 10:15 AM Medical Record Number: [id number] Patient Account Number: [id number] Date of Birth/Sex: 11/18/1923 (87 y.o. 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 Dr. Amalia Hailey the podiatrist saw her on 07/20/2017 and sent her to the wound center for possible hyperbaric oxygen therapy. past medical history of peripheral vascular disease, varicose veins, status post appendectomy, basal cell carcinoma excision from the left leg, cholecystectomy, pacemaker placement, right lower extremity angiography done by Dr. dew in March 2017 with placement of a stent. there is also note of a successful ablation of the right small saphenous vein done which was reviewed by ultrasound on 10/24/2016. the patient had a right small saphenous vein ablation done on 10/20/2016. The patient has never been a smoker. She has been seen by Dr. Corene Cornea dew the vascular surgeon who most recently saw her on 06/15/2017 for evaluation of ongoing  problems with right leg swelling. She had a lower extremity arterial duplex examination done(02/13/17) which showed patent distal right superficial femoral artery stent and above-the-knee popliteal stent without evidence of restenosis. The ABI was more than 1.3 on the right and more than 1.3 on the left. This was consistent with noncompressible arteries due to medial calcification. The right great toe pressure and PPG waveforms are within normal limits and the left great toe pressure and PPG waveforms  are decreased. he recommended she continue to wear her compression stockings and continue with elevation. She is scheduled to have a noninvasive arterial study in the near future 08/16/2017 -- had a lower extremity arterial duplex examination done which showed patent distal right superficial femoral artery stent and above-the-knee popliteal stent without evidence of restenosis. The ABI was more than 1.3 on the right and more than 1.3 on the left. This was consistent with noncompressible arteries due to medial calcification. The right great toe pressure and PPG waveforms are within normal limits and the left great toe pressure and PPG waveforms are decreased. the x-ray of the right ankle has not yet been done 08/24/2017 -- had a right ankle x-ray -- IMPRESSION:1. No fracture, bone lesion or evidence of osteomyelitis. 2. Lateral soft tissue swelling with a soft tissue ulcer. she has not yet seen the vascular surgeon for review 08/31/17 on evaluation today patient's wound appears to be showing signs of improvement. She still with her appointment with vascular in order to review her results of her vascular study and then determine if any intervention would be recommended at that time. No fevers, chills, nausea, or vomiting noted at this time. She has been tolerating the dressing changes without complication. 09/28/17 on evaluation today patient's wound appears to show signs of good improvement in regard to  the granulation tissue which is surfacing. There is still a layer of slough covering the wound and the posterior portion is still significantly deeper than the anterior nonetheless there has been some good sign of things moving towards the better. She is going to go back to Dr. dew for reevaluation to ensure her blood flow is still appropriate. That will be before her next evaluation with [place] next week. No fevers, chills, nausea, or vomiting noted at this time. Patient does have some discomfort rated to be a 3-4/10 depending on activity specifically cleansing the wound makes it worse. 10/05/2017 -- the patient was seen by Dr. Lucky Cowboy last week and noninvasive studies showed a normal right ABI with brisk triphasic waveforms consistent with no arterial insufficiency including normal digital pressures. The duplex showed a patent distal right SFA stent and the proximal SFA was also normal. He was pleased with her test and thought she should have enough of perfusion for normal wound healing. He would see her back in 6 months time. 12/21/17 on evaluation today patient appears to be doing fairly well in regard to her right lateral ankle wound. Unfortunately the main issue that she is expansion at this point is that she is having some issues with what appears to be some cellulitis in the right anterior shin. She has also been noting a little bit of uncomfortable feeling especially last night and her ankle area. I'm afraid that she made the developing a little bit of an infection. With that being said I think it is in the early stages. 12/28/17 on evaluation today patient's ankle appears to be doing excellent. She's making good progress at this point the cellulitis seems to have improved after last week's evaluation. Overall she is having no significant discomfort which is excellent news. She does have an appointment with Dr. dew on March 29, 2018 for reevaluation in regard to the stent he placed. She seems to have  excellent blood flow in the right lower extremity. 01/19/12 on evaluation today patient's wound appears to be doing very well. In fact she does not appear to require debridement at this point, there's no evidence of infection, and  overall from the standpoint of the wound she seems to be doing very well. With that being said I believe that it may be time to switch to different dressing away from the [name] Dressing she tells me she does have a lot going on her friend actually passed away yesterday and she's also having a lot of issues with her husband this obviously is weighing heavy on her as far as your thoughts and concerns today. 01/25/18 on evaluation today patient appears to be doing fairly well in regard to her right lateral malleolus. She has been tolerating the dressing changes without complication. Overall I feel like this is definitely showing signs of improvement as far as how the overall appearance of the wound is there's also evidence of epithelium start to migrate over the granulation tissue. In general I think that she is progressing nicely as far as the wound is concerned. The only concern she really has is whether or not we can switch to every other week visits in order to avoid having as many appointments as her daughters have a difficult time getting her to her appointments as well as the patient's husband to his he is not doing very well at this point. 02/22/18 on evaluation today patient's right lateral malleolus ulcer appears to be doing great. She has been tolerating the dressing changes without complication. Overall you making excellent progress at this time. Patient is having no significant discomfort. CALENE, PARADISO ([id number]) 03/15/18 on evaluation today patient appears to be doing much more poorly in regard to her right lateral ankle ulcer at this point. Unfortunately since have last seen her her husband has passed just a few days ago is obviously weighed heavily  on her her daughter also had surgery well she is with her today as usual. There does not appear to be any evidence of infection she does seem to have significant contusion/deep tissue injury to the right lateral malleolus which was not noted previous when I saw her last. It's hard to tell of exactly when this injury occurred although during the time she was spending the night in the hospital this may have been most likely. 03/22/18 on evaluation today patient appears to actually be doing very well in regard to her ulcer. She did unfortunately have a setback which was noted last week however the good news is we seem to be getting back on track and in fact the wound in the core did still have some necrotic tissue which will be addressed at this point today but in general I'm seeing signs that things are on the up and up. She is glad to hear this obviously she's been somewhat concerned that due to the how her wound digressed more recently. 03/29/18 on evaluation today patient appears to be doing fairly well in regard to her right lower extremity lateral malleolus ulcer. She unfortunately does have a new area of pressure injury over the inferior portion where the wound has opened up a little bit larger secondary to the pressure she seems to be getting. She does tell me sometimes when she sleeps at night that it actually hurts and does seem to be pushing on the area little bit more unfortunately. There does not appear to be any evidence of infection which is good news. She has been tolerating the dressing changes without complication. She also did have some bruising in the left second and third toes due to the fact that she may have bump this or injured it although  she has neuropathy so she does not feel she did move recently that may have been where this came from. Nonetheless there does not appear to be any evidence of infection at this time. 04/12/18 on evaluation today patient's wound on the right lateral  ankle actually appears to be doing a little bit better with a lot of necrotic docking tissue centrally loosening up in clearing away. However she does have the beginnings of a deep tissue injury on the left lateral malleolus likely due to the fact we've been trying offload the right as much as we have. I think she may benefit from an assistive soft device to help with offloading and it looks like they're looking at one of the doughnut conditions that wraps around the lower leg to offload which I think will definitely do a good job. With that being said I think we definitely need to address this issue on the left before it becomes a wound. Patient is not having significant pain. 04/19/18 on evaluation today patient appears to be doing excellent in regard to the progress she's made with her right lateral ankle ulcer. The left ankle region which did show evidence of a deep tissue injury seems to be resolving there's little fluid noted underneath and a blister there's nothing open at this point in time overall I feel like this is progressing nicely which is good news. She does not seem to be having significant discomfort at this point which is also good news. 04/25/18-She is here in follow up evaluation for bilateral lateral malleolar ulcers. The right lateral malleolus ulcer with pale subcutaneous tissue exposure, central area of ulcer with tendon/periosteum exposed. The left lateral malleolus ulcer now with central area of nonviable tissue, otherwise deep tissue injury. She is wearing compression wraps to the left lower extremity, she will place the right lower extremity compression wraps on when she gets home. She will be out of town over the weekend and return next week and follow-up appointment. She completed her doxycycline this morning 05/03/18 on evaluation today patient appears to be doing very well in regard to her right lateral ankle ulcer in general. At least she's showing some signs of  improvement in this regard. Unfortunately she has some additional injury to the left lateral malleolus region which appears to be new likely even over the past several days. Again this determination is based on the overall appearance. With that being said the patient is obviously frustrated about this currently. 05/10/18-She is here in follow-up evaluation for bilateral lateral malleolar ulcers. She states she has purchased offloading shoes/boots and they will arrive tomorrow. She was asked to bring them in the office at next week's appointment so her provider is aware of product being utilized. She continues to sleep on right or left side, she has been encouraged to sleep on her back. The right lateral malleolus ulcer is precariously close to peri-osteum; will order xray. The left lateral malleolus ulcer is improved. Will switch back to santyl; she will follow up next week. 05/17/18 on evaluation today patient actually appears to be doing very well in regard to her malleolus her ulcers compared to last time I saw them. She does not seem to have as much in the way of contusion at this point which is great news. With that being said she does continue to have discomfort and I do believe that she is still continuing to benefit from the offloading/pressure reducing boots that were recommended. I think this is the key to  trying to get this to heal up completely. 05/24/18 on evaluation today patient actually appears to be doing worse at this point in time unfortunately compared to her last week's evaluation. She is having really no increased pain which is good news unfortunately she does have more maceration in your theme and noted surrounding the right lateral ankle the left lateral ankle is not really is erythematous I do not see signs of the overt cellulitis on that side. Unfortunately the wounds do not seem to have shown any signs of improvement since the last evaluation. She also has significant swelling  especially on the right compared to previous some of this may be due to infection however also think that she may be served better while she has these wounds by compression wrapping versus continuing to use the Juxta-Lite for the time being. Especially with the amount of drainage that she is experiencing at this point. No fevers, chills, nausea, or vomiting noted at this time. 05/31/18 on evaluation today patient appears to actually be doing better in regard to her right lateral lower extremity ulcer specifically on the malleolus region. She has been tolerating the antibiotic without complication. With that being said she still continues to have issues but a little bit of redness although nothing like she what she was experiencing previous. She still continues to pressure to her ankle area she did get the problem on offloading boots unfortunately she will not wear them she states there too uncomfortable and she can't get in and out of the bed. Nonetheless at this point her wounds seem to be continually getting worse which is not what we want I'm getting somewhat concerned about her progress and how things are going to proceed if we do not intervene in some way shape or form. I therefore had a very lengthy conversation today about offloading yet again and even made a specific suggestion for switching her to a memory foam mattress and even gave the information for a specific one that they could look at getting if it was something that they were interested in considering. She does not want to be considered for a hospital bed air mattress although honestly insurance would not cover it that she does not have any wounds on her trunk. 06/14/18 on evaluation today both wounds over the bilateral lateral malleolus her ulcers appear to be doing better there's no evidence of pressure injury at this point. She did get the foam mattress for her bed and this does seem to have been extremely beneficial for her in my  pinion. Her daughter states that she is having difficulty getting out of bed because of how soft it is. The patient also relates this to be. Nonetheless I do feel like she's actually doing better. Unfortunately right after and around the time she was getting the mattress she also sustained a fall when she got up to go pick up the phone and ended up injuring her right elbow she has 18 sutures in place. We are not caring for this currently although home health is going to be taking the sutures out shortly. Nonetheless this may be something that we need to evaluate going forward. It depends on how well it has or has not healed in the end. She also recently saw an orthopedic specialist for an injection in the right shoulder just before her fall unfortunately the fall seems to have worsened her pain. 06/21/18 on evaluation today patient appears to be doing about the same in regard to her  lateral malleolus ulcers. Both appear to be just a little bit deeper but again we are clinging away the necrotic and dead tissue which I think is why this is progressing towards a deeper realm as opposed ADEAN, MILOSEVIC. ([id number]) to improving from my measurement standpoint in that regard. Nonetheless she has been tolerating the dressing changes she absolutely hates the memory foam mattress topper that was obtained for her nonetheless I do believe this is still doing excellent as far as taking care of excess pressure in regard to the lateral malleolus regions. She in fact has no pressure injury that I see whereas in weeks past it was week by week I was constantly seeing new pressure injuries. Overall I think it has been very beneficial for her. 07/03/18; patient arrives in my clinic today. She has deep punched out areas over her bilateral lateral malleoli. The area on the right has some more depth. We spent a lot of time today talking about pressure relief for these areas. This started when her daughter asked for a  prescription for a memory foam mattress. I have never written a prescription for a mattress and I don't think insurances would pay for that on an ordinary bed. In any case he came up that she has foam boots that she refuses to wear. I would suggest going to these before any other offloading issues when she is in bed. They say she is meticulous about offloading this the rest of the day 07/10/18- She is seen in follow-up evaluation for bilateral, lateral malleolus ulcers. There is no improvement in the ulcers. She has purchased and is sleeping on a memory foam mattress/overlay, she has been using the offloading boots nightly over the past week. She has a follow up appointment with vascular medicine at the end of October, in my opinion this follow up should be expedited given her deterioration and suboptimal TBI results. We will order plain film xray of the left ankle as deeper structures are palpable; would consider having MRI, regardless of xray report(s). The ulcers will be treated with iodoflex/iodosorb, she is unable to safely change the dressings daily with santyl. 07/19/18 on evaluation today patient appears to be doing in general visually well in regard to her bilateral lateral malleolus ulcers. She has been tolerating the dressing changes without complication which is good news. With that being said we did have an x-ray performed on 07/12/18 which revealed a slight loosen see in the lateral portion of the distal left fibula which may represent artifact but underline lytic destruction or osteomyelitis could not be excluded. MRI was recommended. With that being said we can see about getting the patient scheduled for an MRI to further evaluate this area. In fact we have that scheduled currently for August 20 19,019. 07/26/18 on evaluation today patient's wound on the right lateral ankle actually appears to be doing fairly well at this point in my pinion. She has made some good progress currently. With  that being said unfortunately in regard to the left lateral ankle ulcer this seems to be a little bit more problematic at this time. In fact as I further evaluated the situation she actually had bone exposed which is the first time that's been the case in the bone appear to be necrotic. Currently I did review patient's note from Dr. Bunnie Domino office with [facility]. He stated that ABI was 1.26 on the right and 0.95 on the left with good waveforms. Her perfusion is stable not  reduced from previous studies and her digital waveforms were pretty good particularly on the right. His conclusion upon review of the note was that there was not much she could do to improve her perfusion and he felt she was adequate for wound healing. His suggestion was that she continued to see [place] and consider a synthetic skin graft if there was no underlying infection. He plans to see her back in six months or as needed. 08/01/18 on evaluation today patient appears to be doing better in regard to her right lateral ankle ulcer. Her left lateral ankle ulcer is about the same she still has bone involvement in evidence of necrosis. There does not appear to be evidence of infection at this time On the right lateral lower extremity. I have started her on the Augmentin she picked this up and started this yesterday. This is to get her through until she sees infectious disease which is scheduled for 08/12/18. 08/06/18 on evaluation today patient appears to be doing rather well considering my discussion with patient's daughter at the end of last week. The area which was marked where she had erythema seems to be improved and this is good news. With that being said overall the patient seems to be making good improvement when it comes to the overall appearance of the right lateral ankle ulcer although this has been slow she at least is coming around in this regard. Unfortunately in regard to the left lateral ankle ulcer this  is osteomyelitis based on the pathology report as well is bone culture. Nonetheless we are still waiting CT scan. Unfortunately the MRI we originally ordered cannot be performed as the patient is a pacemaker which I had overlooked. Nonetheless we are working on the CT scan approval and scheduling as of now. She did go to the hospital over the weekend and was placed on IV Cefzo for a couple of days. Fortunately this seems to have improved the erythema quite significantly which is good news. There does not appear to be any evidence of worsening infection at this time. She did have some bleeding after the last debridement therefore I did not perform any sharp debridement in regard to left lateral ankle at this point. Patient has been approved for a snap vac for the right lateral ankle. 08/14/18; the patient with wounds over her bilateral lateral malleoli. The area on the right actually looks quite good. Been using a snap back on this area. Healthy granulation and appears to be filling in. Unfortunately the area on the left is really problematic. 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 Also noted to have cellulitis. She saw Dr. Novella Olive of infectious disease today and was put on linezolid. We are able to verify this with her pharmacy. She is completed the Augmentin that she was already on. We've been using Iodoflex to this area 08/23/18 on evaluation today patient's wounds both actually appear to be doing better compared to my prior evaluations. Fortunately she showing signs of good improvement in regard to the overall wound status especially where were using the snap vac on the right. In regard to left lateral malleolus the wound bed actually appears to be much cleaner than previously noted. I do not feel any phone directly probed during evaluation today and though there is tendon noted this does not appear to be necrotic  it's actually fairly good as far as the overall appearance of the tendon  is concerned. In general the wound bed actually appears to be doing significantly better than it was previous. Patient is currently in the care of [name] and I did review that note today. He actually has her on two weeks of linezolid and then following the patient will be on 1-2 months of Keflex. That is the plan currently. She has been on antibiotics therapy as prescribed by myself initially starting on July 30, 2018 and has been on that continuously up to this point. 08/30/18 on evaluation today patient actually appears to be doing much better in regard to her right lateral malleolus ulcer. She has been tolerating the dressing changes specifically the snap vac without complication although she did have some issues with the seal currently. Apparently there was some trouble with getting it to maintain over the past week past Sunday. Nonetheless overall the wound appears better in regard to the right lateral malleolus region. In regard to left lateral malleolus this actually show some signs of additional granulation although there still tendon noted in the base of the wound this appears to be healthy not necrotic in any way whatsoever. We are considering potentially using a snap vac for the left lateral malleolus as well the product wrap from [employer], [place], was present in the clinic today we're going to see this patient I did have her come in with me after obtaining consent from the patient and her daughter in order to look at the wound and see if there's any recommendation one way or another as to whether or not they felt the snapback could be beneficial for the left lateral malleolus region. But the conclusion was that it might be but that this is definitely a little bit deeper wound than what traditionally would be utilized for a snap vac. 09/06/18 on evaluation today patient actually appears to be doing excellent in my pinion  in regard to both ankle ulcers. She has been tolerating the dressing changes without complication which is great news. Specifically we have been using the snap vac. In regard to the right ankle I'm not even sure that this is going to be necessary for today and following as the wound has filled in quite nicely. In regard to the left ankle I do believe SHATEKA, PETREA ([id number]) that we're seeing excellent epithelialization from the edge as well as granulation in the central portion the tendon is still exposed but there's no evidence of necrotic bone and in general I feel like the patient has made excellent progress even compared to last week with just one week of the snap vac. 09/11/18; this is a patient who has wounds on her bilateral lateral malleoli. Initially both of these were deep stage IV wounds in the setting of chronic arterial insufficiency. She has been revascularized. As I understand think she been using snap vacs to both of these wounds however the area on the right became more superficial and currently she is only using it on the left. Using silver collagen on the right and silver collagen under the back on the left I believe 09/19/18 on evaluation today patient actually appears to be doing very well in regard to her lateral malleolus or ulcers bilaterally. She has been tolerating the dressing changes without complication. Fortunately there does not appear to be any evidence of infection at this time. Overall I feel like she is improving in an excellent manner and I'm very pleased with the fact that everything seems to be turning towards the better for  her. This has obviously been a long road. 09/27/18 on evaluation today patient actually appears to be doing very well in regard to her bilateral lateral malleolus ulcers. She has been tolerating the dressing changes without complication. Fortunately there does not appear to be any evidence of infection at this time which is also great  news. No fevers, chills, nausea, or vomiting noted at this time. Overall I feel like she is doing excellent with the snap vac on the left malleolus. She had 40 mL of fluid collection over the past week. 10/04/18 on evaluation today patient actually appears to be doing well in regard to her bilateral lateral malleolus ulcers. She continues to tolerate the dressing changes without complication. One issue that I see is the snap vac on the left lateral malleolus which appears to have sealed off some fluid underlying this area and has not really allowed it to heal to the degree that I would like to see. For that reason I did suggest at this point we may want to pack a small piece of packing strip into this region to allow it to more effectively wick out fluid. 10/11/18 in general the patient today does not feel that she has been doing very well. She's been a little bit lethargic and subsequently is having bodyaches as well according to what she tells me today. With that being said overall she has been concerned with the fact that something may be worsening although to be honest her wounds really have not been appearing poorly. She does have a new ulcer on her left heel unfortunately. This may be pressure related. Nonetheless it seems to me to have potentially started at least as a blister I do not see any evidence of deep tissue injury. In regard to the left ankle the snap vac still seems to be causing the ceiling off of the deeper part of the wound which is in turn trapping fluid. I'm not extremely pleased with the overall appearance as far as progress from last week to this week therefore I'm gonna discontinue the snap vac at this point. 10/18/18 patient unfortunately this point has not been feeling well for the past several days. She was seen by Grayland Ormond her primary care provider who is a Librarian, academic at [facility]. Subsequently she states that she's been very weak and generally feeling  malaise. No fevers, chills, nausea, or vomiting noted at this time. With that being said bloodwork was performed at the PCP office on the 11th of this month which showed a white blood cell count of 10.7. This was repeated today and shows a white blood cell count of 12.4. This does show signs of worsening. Coupled with the fact that she is feeling worse and that her left ankle wound is not really showing signs of improvement I feel like this is an indication that the osteomyelitis is likely exacerbating not improving. Overall I think we may also want to check her C-reactive protein and sedimentation rate. Actually did call [facility] office this afternoon while the patient was in the office here with me. Subsequently based on the findings we discussed treatment possibilities and I think that it is appropriate for [place] to go ahead and initiate treatment with doxycycline which I'm going to do. Subsequently he did agree to see about adding a CRP and sedimentation rate to her orders. If that has not already been drawn to where they can run it they will contact the patient she can come back to  have that check. They are in agreement with plan as far as the patient and her daughter are concerned. Nonetheless also think we need to get in touch with Dr. Henreitta Leber office to see about getting the patient scheduled with him as soon as possible. 11/08/18 on evaluation today patient presents for follow-up concerning her bilateral foot and ankle ulcers. I did do an extensive review of her chart in epic today. Subsequently she was seen by [name] he did initiate Cefepime IV antibiotic therapy. Subsequently she had some issues with her PICC line this had to be removed because it was coiled and then replaced. Fortunately that was now settled. Unfortunately she has continued have issues with her left heel as well as the issues that she is experiencing with her bilateral lateral malleolus regions. I do believe however  both areas seem to be doing a little bit better on evaluation today which is good news. No fevers, chills, nausea, or vomiting noted at this time. She actually has an angiogram schedule with Dr. dew on this coming Monday, November 11, 2018. Subsequently the patient states that she is feeling much better especially than what she was roughly 2 weeks ago. She actually had to cancel an appointment because she was feeling so poorly. No fevers, chills, nausea, or vomiting noted at this time. 11/15/18 on evaluation today patient actually is status post having had her angiogram with Dr. dew Monday, four days ago. 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 He had to go and work on several areas of the vasculature fortunately he was able to obtain no more than a 30% residual stenosis throughout post procedure. I reviewed this note today. I think this will definitely help with healing at this time. Fortunately there does not appear to be any signs of infection and I do feel like ratio already has a better appearance to it. 11/22/18 upon evaluation today patient actually appears to be doing very well in regard to her wounds in general. The right lateral malleolus looks excellent the heel looks better in the left lateral malleolus also appears to be doing a little better. With that being said the right second toe actually appears to be open and training we been watching this is been dry and stable but now is open. 12/03/2018 Seen today for follow-up and management of multiple bilateral lower extremity wounds. New pressure injury of the great toe which is closed at this time. Wound of the right distal second toe appears larger today with deep undermining and a pocket of fluid present within the undermining region. Left and right malleolus is wounds are stable today with no signs and symptoms of infection.Denies any needs or concerns during exam today. 12/13/18 on evaluation today patient  appears to be doing somewhat better in regard to her left heel ulcer. She also seems to be completely healed in regard to the right lateral malleolus ulcer. The left malleolus ulcer is smaller what unfortunately the wounds which are new over the first and second toes of the right foot are what are most concerning at this point especially the second. Both areas did require sharp debridement today. 12/20/18 on evaluation today patient's wound actually appears to be doing better in regard to left lateral ankle and her right lateral ankle continues to remain healed. The hill ulcer on the left is improved. She does have improvement noted as well in regard to both toe ulcers. Overall I'm very pleased in this  regard. No fevers, chills, nausea, or vomiting noted at this time. 12/23/18 on evaluation today patient is seen after she had her toenails trimmed at the podiatrist office due to issues with her right great toe. There was what appeared to be dark eschar on the surface of the wound which had her in the podiatrist concerned. Nonetheless as I remember that during the last office visit I had utilize silver nitrate of this area I was much less concerned about the situation. Subsequently I was able to clean off much of this tissue without any complication today. This does not appear to show any signs of infection and actually look somewhat better Gugel, [name] ([id number]) compared to last time post debridement. Her second toe on the right foot actually had callous over and there did appear still be some fluid underneath this that would require debridement today. 12/27/18 on evaluation today patient actually appears to be showing signs of improvement at all locations. Even the left lateral ankle although this is not quite as great as the other sites. Fortunately there does not appear to be any signs of infection at this time and both of her toes on the right foot seem to be showing signs of improvement  which is good news and very pleased in this regard. 01/03/19 on evaluation today patient appears to be doing better for the most part in regard to her wounds in particular. There does not appear to be any evidence of infection at this time which is good news. Fortunately there is no sign of really worsening anywhere except for the right great toe which she does have what appears to be a bruise/deep tissue injury which is very superficial and already resolving. I'm not sure where this came from I questioned her extensively and she does not recall what may have happened with this. Other than that the patient seems to be doing well even the left lateral ankle ulcer looks good and is getting smaller. 01/10/19 on evaluation today patient appears to be doing well in regard to her left heel wound and both of her toe wounds. Overall I feel like there is definitely improvement here and I'm happy in that regard. With that being said unfortunately she is having issues with the left lateral malleolus ulcer which unfortunately still has a lot of depth to it. This is gonna be a very difficult wound for [place] to be able to truly get to heal. I may want to consider some type of skin substitute to see if this would be of benefit for her. I'll discuss this with her more the next visit most likely. This was something I thought about more at the end of the visit when I was Artie out of the room and the patient had been discharged. 01/17/19 on evaluation today patient appears to be doing very well in regard to her wounds in general. She's been making excellent progress at this time. Fortunately there's no sign of infection at this time either. No fevers, chills, nausea, or vomiting noted at this time. The biggest issue is still her left lateral malleolus where it appears to be doing well and is getting smaller but still shows a small corner where this is deeper and goes down into what appears to be the joint space. Nonetheless  this is taking much longer to heal although it still looks better in smaller than previous evaluations. 01/24/19 on evaluation today patient's wounds actually appear to be doing rather well in general overall. She  did require some sharp debridement in regard to the right great toe but everything else appears to be doing excellent no debridement was even necessary. No fevers, chills, nausea, or vomiting noted at this time. 01/31/19 on evaluation today patient actually appears to be doing much better in regard to her left foot wound on the heel as well as the ankle. The right great toe appears to be a little bit worse today this had callous over and trapped a lot of fluid underneath. Fortunately there's no signs of infection at any site which is great news. 02/07/19 on evaluation today patient actually appears to be doing decently well in regard to all of her ulcers at this point. No sharp debridement was required she is a little bit of hyper granulation in regard to the left lateral ankle as well as the left heel but the hill itself is almost completely healed which is excellent news. Overall been very pleased in this regard. 02/14/19 on evaluation today patient actually appears to be doing very well in regard to her ulcers on the right first toe, left lateral malleolus, and left heel. In fact the heel is almost completely healed at this point. The patient does not show any signs of infection which is good news. Overall very pleased with how things have progressed. 04/18/19 Telehealth Evaluation During the COVID-19 National Emergency: Verbal Consent: Obtained from patient Allergies: reviewed and the active list is current. Medication changes: patient has no current medication changes. COVID-19 Screening: 1. Have you traveled internationally or on a cruise ship in the last 14 dayso No 2. Have you had contact with someone with or under investigation for COVID-19o No 3. Have you had a fever, cough, sore  throat, or experiencing shortness of breatho No on evaluation today actually did have a visit with this patient through a telehealth encounter with her home health nurse. Subsequently it was noted that the patient actually appears to be doing okay in regard to her wounds both the right great toe as well as the left lateral malleolus have shown signs of improvement although this in your theme around the left lateral malleolus there eschar coverings for both locations. The question is whether or not they are actually close and whether or not home health needs to discharge the patient or not. Nonetheless my concern is this point obviously is that without actually seeing her and being able to evaluate this directly I cannot ensure that she is completely healed which is the question that I'm being asked. 04/22/19 on evaluation today patient presents for her first evaluation since last time I saw her which was actually February 14, 2019. I did do a telehealth visit last week in which point it was questionable whether or not she may be healed and had to bring her in today for confirmation. With that being said she does seem to be doing quite well at this point which is good news. There does not appear to be any drainage in the deed I believe her wounds may be healed. Readmission: 09/04/2019 on evaluation today patient appears to be doing unfortunately somewhat more poorly in regard to her left foot ulcer secondary to a wound that began on 08/21/2019 at least when she first noticed this. Fortunately she has not had any evidence of active infection at this time. Systemically. I also do not necessarily see any evidence of infection at the blister/wound site on the first metatarsal head plantar aspect. This almost appears to be something that may  have just rubbed inappropriately causing this to breakdown. They did not want a wait too long to come in to be seen as again she had significant issues in the past with  wounds that took quite a while to heal in fact it was close to 2 years. Nonetheless this does not appear to be quite that bad but again we do need to remove some of the necrotic tissue from the surface of the wound to tell exactly the extent. She does not appear to have any significant arterial disease at this point and again her last ABIs and TBI's are recorded above in the alert section her left ABI was 1.27 with a TBI of 0.72 to the right ABI 1.08 with a TBI of 0.39. Other than this the patient has been doing quite well since I last saw her and that was in May 2020. 09/11/2019 on evaluation today patient appeared to be doing very well with regard to her plantar foot ulcer on the left. In fact this appears to be almost completely healed which is awesome. That is after just 1 week of intervention. With that being said there is no signs of active infection at this time. LEQUITA, MEADOWCROFT ([id number]) 09/18/2019 on evaluation today patient actually appears to be doing excellent in fact she is completely healed based on what I am seeing at this point. Fortunately there is no signs of active infection at this time and overall patient is very pleased to hear that this area has healed so quickly. Readmission: 05/13/2021 upon evaluation today this patient presents for reevaluation here in the clinic. This is a wound that actually we previously took care of. She had 1 on the right ankle and the left the left turned out to be be harder due to to heal but nonetheless is doing great at this point as the right that has reopened and it was noted first just several weeks ago with a scab over it and came off in just the past few days. Fortunately there does not appear to be any obvious evidence of significant active infection at this time which is great news. No fevers, chills, nausea, vomiting, or diarrhea. 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 There does not  appear to be any signs of this interfering in any way with her wound. She does have swelling we previously had compression socks for her ordered but again it does not look like she wears these on a regular basis by any means. 05/26/2021 upon evaluation today patient appears to be doing well with regard to her wound which is actually showing signs of excellent improvement. There does not appear to be any signs of active infection which is great news and overall very pleased with where things stand today. No fevers, chills, nausea, vomiting, or diarrhea. 06/02/2021 upon evaluation today patient's wound actually showing signs of excellent improvement. Fortunately there does not appear to be any signs of active infection which is great news. I think the patient is making good progress with regard to her wounds in general. 06/09/2021 upon evaluation today patient appears to be doing excellent in regard to her wounds currently. Fortunately there does not appear to be any signs of active infection which is great news. No fevers, chills, nausea, vomiting, or diarrhea. Overall extremely pleased with where things stand today. I think the patient is making excellent progress. 06/16/2021 upon evaluation today patient appears to be doing well in regard  to her wound. This is going require little bit of debridement today and that was discussed with the patient. Otherwise she seems to be doing quite well and I am actually very pleased with where things stand at this point. No fevers, chills, nausea, vomiting, or diarrhea. 06/23/2021 upon evaluation today patient appears to be doing well with regard to her wounds. She has been tolerating the dressing changes without complication. Fortunately there does not appear to be any evidence of infection and she has not had air in her home which she actually lives at an assisted living that got fixed this morning. With that being said because of that her wrap has been extremely hot  and bothersome for her over the past week. 06/30/2021 upon evaluation today patient is actually making excellent progress in regard to her ankle ulcer. She has been tolerating the dressing changes without complication and overall extremely pleased with where things stand there does not appear to be any evidence of active infection which is great news. No fevers, chills, nausea, vomiting, or diarrhea. 07/07/21 upon evaluation today patients and culture on the right actually appears to be doing quite well. There does not appear to be any signs of infection and overall very pleased with where things stand today. No fevers, chills, nausea, or vomiting noted at this time. 07/14/2021 unfortunately the patient today has some evidence of deep tissue injury and pressure getting to the ankle region. Again I am not exactly sure what is going on here but this is very similar to issues that we have had in the past. I explained to the patient that she needs to be very mindful of exactly what is happening I think sleeping in bed is probably the main issue here although there could be other culprits I am not sure what else would potentially lead to this kind of a problem for her. 07/21/2021 upon evaluation today patient's wound actually showing signs of improvement compared to last week. Fortunately there does not appear to be any signs of active infection which is great news and overall very pleased with where things stand in that regard. With that being said I do believe that she is continuing to show signs of overall of getting better although I think this is still basically about what we were 2 weeks ago due to the worsening and now improvement. 07/28/2021 upon evaluation today patient appears to be doing well with regard to her wound. She does have some slough buildup on the surface of the wound which I would have to manage today. Fortunately there is no sign of active infection at this time. No fevers, chills,  nausea, vomiting, or diarrhea. 08/04/2021 upon evaluation today patient appears to be doing about the same in regard to her wound. To be perfectly honest I am beginning to be a little bit concerned about the overall appearance of the wound bed. I do think possibly taking a sample right around the margin of the wound could be beneficial for her as far as identifying anything such as an inflammatory process or to be honest even a skin tag cancer type process that may be of concern here. Fortunately there does not appear to be any evidence of active infection at this time which is great news she is not having any pain also great news. 08/11/2021 upon evaluation today patient appears to be doing well with regard to her wound. The good news is I did review her biopsy results and it showed some inflammatory mixed  findings but nothing that appeared to be malignant which is great news. Overall this is more of a chronic venous stasis type issue which again is more what we have been treating. Nonetheless I just wanted to make sure before going forward that there was not anything more untoward going on at this point. 08/18/2021 upon evaluation today patient appears to be doing well with regard to her ankle ulcer. Fortunately there does not appear to be any signs of active infection at this time which is great overall wound is dramatically improved compared to last week. Since last week I have actually placed her on doxycycline and subsequently this is a good option as far as the findings are concerned at this point. 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 the doxycycline is doing that. There does not appear to be any evidence of active infection systemically which is great news. 08/25/2021 upon evaluation today patient appears to be doing well with regard to her wound. I feel like we are  finally get back on track as far as healing is concerned I am much happier with the overall appearance today. I do think that she is tolerating the dressing changes without complication which is great news. We have been using Hydrofera Blue which I think is a good option. The good news is she is also doing great in regard to her compression sock on the left which is a zipper compression that seems to be doing a great job keeping her edema under good control. Electronic Signature(s) ANAHIT, KLUMB ([id number]) Signed: 08/25/2021 1:56:45 PM By: Worthy Keeler PA-C Entered By: Worthy Keeler on 08/25/2021 13:56:44 Wahid, [name] ([id number]) -------------------------------------------------------------------------------- Physical Exam Details Patient Name: [name] Date of Service: 08/25/2021 10:15 AM Medical Record Number: [id number] Patient Account Number: [id number] Date of Birth/Sex: [birth year] (87 y.o. F) Treating RN: Carlene Coria Primary Care Provider: Adrian Prows Other Clinician: Referring Provider: Adrian Prows Treating Provider/Extender: Skipper Cliche in Treatment: 89 Constitutional Well-nourished and well-hydrated in no acute distress. Respiratory normal breathing without difficulty. Psychiatric this patient is able to make decisions and demonstrates good insight into disease process. Alert and Oriented x 3. pleasant and cooperative. Notes Upon inspection patient's wound bed actually showed signs of better granulation epithelization I did perform some sharp debridement clear away necrotic debris she tolerated that today without complication and postdebridement wound bed appears to be doing much better. Electronic Signature(s) Signed: 08/25/2021 2:03:25 PM By: Worthy Keeler PA-C Entered By: Worthy Keeler on 08/25/2021 14:03:25 Granados, [name]  ([id number]) -------------------------------------------------------------------------------- Physician Orders Details Patient Name: [name] Date of Service: 08/25/2021 10:15 AM Medical Record Number: [id number] Patient Account Number: [id number] Date of Birth/Sex: 25-Jan-1923 (87 y.o. F) Treating RN: Carlene Coria Primary Care Provider: Adrian Prows Other Clinician: Referring Provider: Adrian Prows Treating Provider/Extender: Skipper Cliche in Treatment: 14 Verbal / Phone Orders: No Diagnosis Coding ICD-10 Coding Code Description 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, stage 3 I89.0 Lymphedema, not elsewhere classified I87.2 Venous insufficiency (chronic) (peripheral) Z95.0 Presence of cardiac pacemaker Z79.01 Long term (current) use of anticoagulants Follow-up Appointments o Return Appointment in 1 week. Bathing/ Shower/ Hygiene o May shower with wound dressing protected with water repellent cover or cast protector. Edema Control - Lymphedema / Segmental Compressive Device / Other o Optional: One layer of unna paste to  top of compression wrap (to act as an anchor). - and at toes o Elevate, Exercise Daily and Avoid Standing for Long Periods of Time. o Elevate legs to the level of the heart and pump ankles as often as possible o Elevate leg(s) parallel to the floor when sitting. Wound Treatment Wound #7 - Malleolus Wound Laterality: Right, Lateral Cleanser: Soap and Water 1 x Per Week/30 Days Discharge Instructions: Gently cleanse wound with antibacterial soap, rinse and pat dry prior to dressing wounds Primary Dressing: Hydrofera Blue Ready Transfer Foam, 2.5x2.5 (in/in) 1 x Per Week/30 Days Discharge Instructions: Apply Hydrofera Blue Ready to wound bed as directed Secondary Dressing: ABD Pad 5x9 (in/in) 1 x Per Week/30 Days Discharge Instructions: Cover with ABD pad Compression Wrap: Profore Lite LF 3 Multilayer Compression Bandaging System 1 x  Per Week/30 Days Discharge Instructions: Apply 3 multi-layer wrap as prescribed. Electronic Signature(s) Signed: 08/25/2021 1:17:18 PM By: Carlene Coria RN Signed: 08/25/2021 2:21:21 PM By: Worthy Keeler PA-C Entered By: Carlene Coria on 08/25/2021 10:49:36 [name], [name] ([id number]) -------------------------------------------------------------------------------- Problem List Details Patient Name: [name] Date of Service: 08/25/2021 10:15 AM Medical Record Number: [id number] Patient Account Number: [id number] Date of Birth/Sex: [birth year] (87 y.o. F) Treating RN: Carlene Coria Primary Care Provider: Adrian Prows Other Clinician: Referring Provider: Adrian Prows Treating Provider/Extender: Skipper Cliche in Treatment: 14 Active Problems ICD-10 Encounter Code Description Active Date MDM Diagnosis 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, stage 3 05/13/2021 No Yes I89.0 Lymphedema, not elsewhere classified 05/13/2021 No Yes I87.2 Venous insufficiency (chronic) (peripheral) 05/13/2021 No Yes Z95.0 Presence of cardiac pacemaker 05/13/2021 No Yes Z79.01 Long term (current) use of anticoagulants 05/13/2021 No Yes Inactive Problems Resolved Problems Electronic Signature(s) Signed: 08/25/2021 10:41:34 AM By: Worthy Keeler PA-C Entered By: Worthy Keeler on 08/25/2021 10:41:34 Bendavid, [name] ([id number]) -------------------------------------------------------------------------------- Progress Note Details Patient Name: [name] Date of Service: 08/25/2021 10:15 AM Medical Record Number: [id number] Patient Account Number: [id number] Date of Birth/Sex: 1923-11-07 (87 y.o. 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 Dr. Amalia Hailey the podiatrist saw her on 07/20/2017 and sent her to the wound center for possible hyperbaric oxygen therapy. past medical history of peripheral vascular disease, varicose veins, status post appendectomy, basal cell carcinoma excision from the left leg, cholecystectomy, pacemaker placement, right lower extremity angiography done by Dr. dew in March 2017 with placement of a stent. there is also note of a successful ablation of the right small saphenous vein done which was reviewed by ultrasound on 10/24/2016. the patient had a right small saphenous vein ablation done on 10/20/2016. The patient has never been a smoker. She has been seen by Dr. Corene Cornea dew the vascular surgeon who most recently saw her on 06/15/2017 for evaluation of ongoing problems with right leg swelling. She had a lower extremity arterial duplex examination done(02/13/17) which showed patent distal right superficial femoral artery stent and above-the-knee popliteal stent without evidence of restenosis. The ABI was more than 1.3 on the right and more than 1.3 on the left. This was consistent with noncompressible arteries due to medial calcification. The right great toe pressure and PPG  waveforms are within normal limits and the left great toe pressure and PPG waveforms are decreased. he recommended she continue to wear her compression stockings and continue with elevation. She is scheduled to have a noninvasive arterial study in the near future 08/16/2017 -- had a lower extremity arterial duplex examination done which showed patent distal right superficial femoral artery stent and above-the-knee popliteal stent without evidence of restenosis. The ABI was more than 1.3 on the right and more than 1.3 on the left.  This was consistent with noncompressible arteries due to medial calcification. The right great toe pressure and PPG waveforms are within normal limits and the left great toe pressure and PPG waveforms are decreased. the x-ray of the right ankle has not yet been done 08/24/2017 -- had a right ankle x-ray -- IMPRESSION:1. No fracture, bone lesion or evidence of osteomyelitis. 2. Lateral soft tissue swelling with a soft tissue ulcer. she has not yet seen the vascular surgeon for review 08/31/17 on evaluation today patient's wound appears to be showing signs of improvement. She still with her appointment with vascular in order to review her results of her vascular study and then determine if any intervention would be recommended at that time. No fevers, chills, nausea, or vomiting noted at this time. She has been tolerating the dressing changes without complication. 09/28/17 on evaluation today patient's wound appears to show signs of good improvement in regard to the granulation tissue which is surfacing. There is still a layer of slough covering the wound and the posterior portion is still significantly deeper than the anterior nonetheless there has been some good sign of things moving towards the better. She is going to go back to Dr. dew for reevaluation to ensure her blood flow is still appropriate. That will be before her next evaluation with [place] next week. No fevers, chills, nausea, or vomiting noted at this time. Patient does have some discomfort rated to be a 3-4/10 depending on activity specifically cleansing the wound makes it worse. 10/05/2017 -- the patient was seen by Dr. Lucky Cowboy last week and noninvasive studies showed a normal right ABI with brisk triphasic waveforms consistent with no arterial insufficiency including normal digital pressures. The duplex showed a patent distal right SFA stent and the proximal SFA was also normal. He was pleased with her test and thought she should have enough  of perfusion for normal wound healing. He would see her back in 6 months time. 12/21/17 on evaluation today patient appears to be doing fairly well in regard to her right lateral ankle wound. Unfortunately the main issue that she is expansion at this point is that she is having some issues with what appears to be some cellulitis in the right anterior shin. She has also been noting a little bit of uncomfortable feeling especially last night and her ankle area. I'm afraid that she made the developing a little bit of an infection. With that being said I think it is in the early stages. 12/28/17 on evaluation today patient's ankle appears to be doing excellent. She's making good progress at this point the cellulitis seems to have improved after last week's evaluation. Overall she is having no significant discomfort which is excellent news. She does have an appointment with Dr. dew on March 29, 2018 for reevaluation in regard to the stent he placed. She seems to have excellent blood flow in the right lower extremity. 01/19/12 on evaluation today patient's wound appears to be doing very well. In fact she does  not appear to require debridement at this point, there's no evidence of infection, and overall from the standpoint of the wound she seems to be doing very well. With that being said I believe that it may be time to switch to different dressing away from the [facility] Dressing she tells me she does have a lot going on her friend actually passed away yesterday and she's also having a lot of issues with her husband this obviously is weighing heavy on her as far as your thoughts and concerns today. 01/25/18 on evaluation today patient appears to be doing fairly well in regard to her right lateral malleolus. She has been tolerating the dressing changes without complication. Overall I feel like this is definitely showing signs of improvement as far as how the overall appearance of the wound is there's also  evidence of epithelium start to migrate over the granulation tissue. In general I think that she is progressing nicely as far as the wound is concerned. The only concern she really has is whether or not we can switch to every other week visits in order to avoid having as many DANNAE, KATO ([id number]) appointments as her daughters have a difficult time getting her to her appointments as well as the patient's husband to his he is not doing very well at this point. 02/22/18 on evaluation today patient's right lateral malleolus ulcer appears to be doing great. She has been tolerating the dressing changes without complication. Overall you making excellent progress at this time. Patient is having no significant discomfort. 03/15/18 on evaluation today patient appears to be doing much more poorly in regard to her right lateral ankle ulcer at this point. Unfortunately since have last seen her her husband has passed just a few days ago is obviously weighed heavily on her her daughter also had surgery well she is with her today as usual. There does not appear to be any evidence of infection she does seem to have significant contusion/deep tissue injury to the right lateral malleolus which was not noted previous when I saw her last. It's hard to tell of exactly when this injury occurred although during the time she was spending the night in the hospital this may have been most likely. 03/22/18 on evaluation today patient appears to actually be doing very well in regard to her ulcer. She did unfortunately have a setback which was noted last week however the good news is we seem to be getting back on track and in fact the wound in the core did still have some necrotic tissue which will be addressed at this point today but in general I'm seeing signs that things are on the up and up. She is glad to hear this obviously she's been somewhat concerned that due to the how her wound digressed more recently. 03/29/18  on evaluation today patient appears to be doing fairly well in regard to her right lower extremity lateral malleolus ulcer. She unfortunately does have a new area of pressure injury over the inferior portion where the wound has opened up a little bit larger secondary to the pressure she seems to be getting. She does tell me sometimes when she sleeps at night that it actually hurts and does seem to be pushing on the area little bit more unfortunately. There does not appear to be any evidence of infection which is good news. She has been tolerating the dressing changes without complication. She also did have some bruising in the left second and third  toes due to the fact that she may have bump this or injured it although she has neuropathy so she does not feel she did move recently that may have been where this came from. Nonetheless there does not appear to be any evidence of infection at this time. 04/12/18 on evaluation today patient's wound on the right lateral ankle actually appears to be doing a little bit better with a lot of necrotic docking tissue centrally loosening up in clearing away. However she does have the beginnings of a deep tissue injury on the left lateral malleolus likely due to the fact we've been trying offload the right as much as we have. I think she may benefit from an assistive soft device to help with offloading and it looks like they're looking at one of the doughnut conditions that wraps around the lower leg to offload which I think will definitely do a good job. With that being said I think we definitely need to address this issue on the left before it becomes a wound. Patient is not having significant pain. 04/19/18 on evaluation today patient appears to be doing excellent in regard to the progress she's made with her right lateral ankle ulcer. The left ankle region which did show evidence of a deep tissue injury seems to be resolving there's little fluid noted underneath  and a blister there's nothing open at this point in time overall I feel like this is progressing nicely which is good news. She does not seem to be having significant discomfort at this point which is also good news. 04/25/18-She is here in follow up evaluation for bilateral lateral malleolar ulcers. The right lateral malleolus ulcer with pale subcutaneous tissue exposure, central area of ulcer with tendon/periosteum exposed. The left lateral malleolus ulcer now with central area of nonviable tissue, otherwise deep tissue injury. She is wearing compression wraps to the left lower extremity, she will place the right lower extremity compression wraps on when she gets home. She will be out of town over the weekend and return next week and follow-up appointment. She completed her doxycycline this morning 05/03/18 on evaluation today patient appears to be doing very well in regard to her right lateral ankle ulcer in general. At least she's showing some signs of improvement in this regard. Unfortunately she has some additional injury to the left lateral malleolus region which appears to be new likely even over the past several days. Again this determination is based on the overall appearance. With that being said the patient is obviously frustrated about this currently. 05/10/18-She is here in follow-up evaluation for bilateral lateral malleolar ulcers. She states she has purchased offloading shoes/boots and they will arrive tomorrow. She was asked to bring them in the office at next week's appointment so her provider is aware of product being utilized. She continues to sleep on right or left side, she has been encouraged to sleep on her back. The right lateral malleolus ulcer is precariously close to peri-osteum; will order xray. The left lateral malleolus ulcer is improved. Will switch back to santyl; she will follow up next week. 05/17/18 on evaluation today patient actually appears to be doing very well in  regard to her malleolus her ulcers compared to last time I saw them. She does not seem to have as much in the way of contusion at this point which is great news. With that being said she does continue to have discomfort and I do believe that she is still continuing to benefit  from the offloading/pressure reducing boots that were recommended. I think this is the key to trying to get this to heal up completely. 05/24/18 on evaluation today patient actually appears to be doing worse at this point in time unfortunately compared to her last week's evaluation. She is having really no increased pain which is good news unfortunately she does have more maceration in your theme and noted surrounding the right lateral ankle the left lateral ankle is not really is erythematous I do not see signs of the overt cellulitis on that side. Unfortunately the wounds do not seem to have shown any signs of improvement since the last evaluation. She also has significant swelling especially on the right compared to previous some of this may be due to infection however also think that she may be served better while she has these wounds by compression wrapping versus continuing to use the Juxta-Lite for the time being. Especially with the amount of drainage that she is experiencing at this point. No fevers, chills, nausea, or vomiting noted at this time. 05/31/18 on evaluation today patient appears to actually be doing better in regard to her right lateral lower extremity ulcer specifically on the malleolus region. She has been tolerating the antibiotic without complication. With that being said she still continues to have issues but a little bit of redness although nothing like she what she was experiencing previous. She still continues to pressure to her ankle area she did get the problem on offloading boots unfortunately she will not wear them she states there too uncomfortable and she can't get in and out of the bed.  Nonetheless at this point her wounds seem to be continually getting worse which is not what we want I'm getting somewhat concerned about her progress and how things are going to proceed if we do not intervene in some way shape or form. I therefore had a very lengthy conversation today about offloading yet again and even made a specific suggestion for switching her to a memory foam mattress and even gave the information for a specific one that they could look at getting if it was something that they were interested in considering. She does not want to be considered for a hospital bed air mattress although honestly insurance would not cover it that she does not have any wounds on her trunk. 06/14/18 on evaluation today both wounds over the bilateral lateral malleolus her ulcers appear to be doing better there's no evidence of pressure injury at this point. She did get the foam mattress for her bed and this does seem to have been extremely beneficial for her in my pinion. Her daughter states that she is having difficulty getting out of bed because of how soft it is. The patient also relates this to be. Nonetheless I do feel like she's actually doing better. Unfortunately right after and around the time she was getting the mattress she also sustained a fall when she got up to go pick up the phone and ended up injuring her right elbow she has 18 sutures in place. We are not caring for this currently although home health is going to be taking the sutures out shortly. Nonetheless this may be something that we need to evaluate going forward. It depends on PECOLA, HAXTON ([id number]) how well it has or has not healed in the end. She also recently saw an orthopedic specialist for an injection in the right shoulder just before her fall unfortunately the fall seems to have worsened  her pain. 06/21/18 on evaluation today patient appears to be doing about the same in regard to her lateral malleolus ulcers. Both  appear to be just a little bit deeper but again we are clinging away the necrotic and dead tissue which I think is why this is progressing towards a deeper realm as opposed to improving from my measurement standpoint in that regard. Nonetheless she has been tolerating the dressing changes she absolutely hates the memory foam mattress topper that was obtained for her nonetheless I do believe this is still doing excellent as far as taking care of excess pressure in regard to the lateral malleolus regions. She in fact has no pressure injury that I see whereas in weeks past it was week by week I was constantly seeing new pressure injuries. Overall I think it has been very beneficial for her. 07/03/18; patient arrives in my clinic today. She has deep punched out areas over her bilateral lateral malleoli. The area on the right has some more depth. We spent a lot of time today talking about pressure relief for these areas. This started when her daughter asked for a prescription for a memory foam mattress. I have never written a prescription for a mattress and I don't think insurances would pay for that on an ordinary bed. In any case he came up that she has foam boots that she refuses to wear. I would suggest going to these before any other offloading issues when she is in bed. They say she is meticulous about offloading this the rest of the day 07/10/18- She is seen in follow-up evaluation for bilateral, lateral malleolus ulcers. There is no improvement in the ulcers. She has purchased and is sleeping on a memory foam mattress/overlay, she has been using the offloading boots nightly over the past week. She has a follow up appointment with vascular medicine at the end of October, in my opinion this follow up should be expedited given her deterioration and suboptimal TBI results. We will order plain film xray of the left ankle as deeper structures are palpable; would consider having MRI, regardless of xray  report(s). The ulcers will be treated with iodoflex/iodosorb, she is unable to safely change the dressings daily with santyl. 07/19/18 on evaluation today patient appears to be doing in general visually well in regard to her bilateral lateral malleolus ulcers. She has been tolerating the dressing changes without complication which is good news. With that being said we did have an x-ray performed on 07/12/18 which revealed a slight loosen see in the lateral portion of the distal left fibula which may represent artifact but underline lytic destruction or osteomyelitis could not be excluded. MRI was recommended. With that being said we can see about getting the patient scheduled for an MRI to further evaluate this area. In fact we have that scheduled currently for August 20 19,019. 07/26/18 on evaluation today patient's wound on the right lateral ankle actually appears to be doing fairly well at this point in my pinion. She has made some good progress currently. With that being said unfortunately in regard to the left lateral ankle ulcer this seems to be a little bit more problematic at this time. In fact as I further evaluated the situation she actually had bone exposed which is the first time that's been the case in the bone appear to be necrotic. Currently I did review patient's note from Dr. Bunnie Domino office with [facility]. He stated that ABI was 1.26 on the  right and 0.95 on the left with good waveforms. Her perfusion is stable not reduced from previous studies and her digital waveforms were pretty good particularly on the right. His conclusion upon review of the note was that there was not much she could do to improve her perfusion and he felt she was adequate for wound healing. His suggestion was that she continued to see [place] and consider a synthetic skin graft if there was no underlying infection. He plans to see her back in six months or as needed. 08/01/18 on evaluation today  patient appears to be doing better in regard to her right lateral ankle ulcer. Her left lateral ankle ulcer is about the same she still has bone involvement in evidence of necrosis. There does not appear to be evidence of infection at this time On the right lateral lower extremity. I have started her on the Augmentin she picked this up and started this yesterday. This is to get her through until she sees infectious disease which is scheduled for 08/12/18. 08/06/18 on evaluation today patient appears to be doing rather well considering my discussion with patient's daughter at the end of last week. The area which was marked where she had erythema seems to be improved and this is good news. With that being said overall the patient seems to be making good improvement when it comes to the overall appearance of the right lateral ankle ulcer although this has been slow she at least is coming around in this regard. Unfortunately in regard to the left lateral ankle ulcer this is osteomyelitis based on the pathology report as well is bone culture. Nonetheless we are still waiting CT scan. Unfortunately the MRI we originally ordered cannot be performed as the patient is a pacemaker which I had overlooked. Nonetheless we are working on the CT scan approval and scheduling as of now. She did go to the hospital over the weekend and was placed on IV Cefzo for a couple of days. Fortunately this seems to have improved the erythema quite significantly which is good news. There does not appear to be any evidence of worsening infection at this time. She did have some bleeding after the last debridement therefore I did not perform any sharp debridement in regard to left lateral ankle at this point. Patient has been approved for a snap vac for the right lateral ankle. 08/14/18; the patient with wounds over her bilateral lateral malleoli. The area on the right actually looks quite good. Been using a snap back on this area.  Healthy granulation and appears to be filling in. Unfortunately the area on the left is really problematic. 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 Also noted to have cellulitis. She saw Dr. Novella Olive of infectious disease today and was put on linezolid. We are able to verify this with her pharmacy. She is completed the Augmentin that she was already on. We've been using Iodoflex to this area 08/23/18 on evaluation today patient's wounds both actually appear to be doing better compared to my prior evaluations. Fortunately she showing signs of good improvement in regard to the overall wound status especially where were using the snap vac on the right. In regard to left lateral malleolus the wound bed actually appears to be much cleaner than previously noted. I do not feel any phone directly probed during evaluation today and though there is tendon noted this does not appear to be  necrotic it's actually fairly good as far as the overall appearance of the tendon is concerned. In general the wound bed actually appears to be doing significantly better than it was previous. Patient is currently in the care of [name] and I did review that note today. He actually has her on two weeks of linezolid and then following the patient will be on 1-2 months of Keflex. That is the plan currently. She has been on antibiotics therapy as prescribed by myself initially starting on July 30, 2018 and has been on that continuously up to this point. 08/30/18 on evaluation today patient actually appears to be doing much better in regard to her right lateral malleolus ulcer. She has been tolerating the dressing changes specifically the snap vac without complication although she did have some issues with the seal currently. Apparently there was some trouble with getting it to maintain over the past week past Sunday. Nonetheless overall the wound  appears better in regard to the right lateral malleolus region. In regard to left lateral malleolus this actually show some signs of additional granulation although there still tendon noted in the base of the wound this appears to be healthy not necrotic in any way whatsoever. We are considering potentially using a snap vac for the left lateral malleolus as well the product wrap from [employer], [place], was present in the clinic today we're going to see this patient I did have her come in with me after obtaining consent from the patient and her daughter in order to look at the wound and see if there's any recommendation one way or another as to whether or not they felt the snapback could be beneficial for the left lateral malleolus region. But JAMELYN, BOVARD ([id number]) the conclusion was that it might be but that this is definitely a little bit deeper wound than what traditionally would be utilized for a snap vac. 09/06/18 on evaluation today patient actually appears to be doing excellent in my pinion in regard to both ankle ulcers. She has been tolerating the dressing changes without complication which is great news. Specifically we have been using the snap vac. In regard to the right ankle I'm not even sure that this is going to be necessary for today and following as the wound has filled in quite nicely. In regard to the left ankle I do believe that we're seeing excellent epithelialization from the edge as well as granulation in the central portion the tendon is still exposed but there's no evidence of necrotic bone and in general I feel like the patient has made excellent progress even compared to last week with just one week of the snap vac. 09/11/18; this is a patient who has wounds on her bilateral lateral malleoli. Initially both of these were deep stage IV wounds in the setting of chronic arterial insufficiency. She has been revascularized. As I understand think she been using snap vacs to both  of these wounds however the area on the right became more superficial and currently she is only using it on the left. Using silver collagen on the right and silver collagen under the back on the left I believe 09/19/18 on evaluation today patient actually appears to be doing very well in regard to her lateral malleolus or ulcers bilaterally. She has been tolerating the dressing changes without complication. Fortunately there does not appear to be any evidence of infection at this time. Overall I feel like she is improving in an excellent manner and I'm  very pleased with the fact that everything seems to be turning towards the better for her. This has obviously been a long road. 09/27/18 on evaluation today patient actually appears to be doing very well in regard to her bilateral lateral malleolus ulcers. She has been tolerating the dressing changes without complication. Fortunately there does not appear to be any evidence of infection at this time which is also great news. No fevers, chills, nausea, or vomiting noted at this time. Overall I feel like she is doing excellent with the snap vac on the left malleolus. She had 40 mL of fluid collection over the past week. 10/04/18 on evaluation today patient actually appears to be doing well in regard to her bilateral lateral malleolus ulcers. She continues to tolerate the dressing changes without complication. One issue that I see is the snap vac on the left lateral malleolus which appears to have sealed off some fluid underlying this area and has not really allowed it to heal to the degree that I would like to see. For that reason I did suggest at this point we may want to pack a small piece of packing strip into this region to allow it to more effectively wick out fluid. 10/11/18 in general the patient today does not feel that she has been doing very well. She's been a little bit lethargic and subsequently is having bodyaches as well according to what she  tells me today. With that being said overall she has been concerned with the fact that something may be worsening although to be honest her wounds really have not been appearing poorly. She does have a new ulcer on her left heel unfortunately. This may be pressure related. Nonetheless it seems to me to have potentially started at least as a blister I do not see any evidence of deep tissue injury. In regard to the left ankle the snap vac still seems to be causing the ceiling off of the deeper part of the wound which is in turn trapping fluid. I'm not extremely pleased with the overall appearance as far as progress from last week to this week therefore I'm gonna discontinue the snap vac at this point. 10/18/18 patient unfortunately this point has not been feeling well for the past several days. She was seen by Grayland Ormond her primary care provider who is a Librarian, academic at [facility]. Subsequently she states that she's been very weak and generally feeling malaise. No fevers, chills, nausea, or vomiting noted at this time. With that being said bloodwork was performed at the PCP office on the 11th of this month which showed a white blood cell count of 10.7. This was repeated today and shows a white blood cell count of 12.4. This does show signs of worsening. Coupled with the fact that she is feeling worse and that her left ankle wound is not really showing signs of improvement I feel like this is an indication that the osteomyelitis is likely exacerbating not improving. Overall I think we may also want to check her C-reactive protein and sedimentation rate. Actually did call [facility] office this afternoon while the patient was in the office here with me. Subsequently based on the findings we discussed treatment possibilities and I think that it is appropriate for [place] to go ahead and initiate treatment with doxycycline which I'm going to do. Subsequently he did agree to see about adding  a CRP and sedimentation rate to her orders. If that has not already been drawn to  where they can run it they will contact the patient she can come back to have that check. They are in agreement with plan as far as the patient and her daughter are concerned. Nonetheless also think we need to get in touch with Dr. Henreitta Leber office to see about getting the patient scheduled with him as soon as possible. 11/08/18 on evaluation today patient presents for follow-up concerning her bilateral foot and ankle ulcers. I did do an extensive review of her chart in epic today. Subsequently she was seen by [name] he did initiate Cefepime IV antibiotic therapy. Subsequently she had some issues with her PICC line this had to be removed because it was coiled and then replaced. Fortunately that was now settled. Unfortunately she has continued have issues with her left heel as well as the issues that she is experiencing with her bilateral lateral malleolus regions. I do believe however both areas seem to be doing a little bit better on evaluation today which is good news. No fevers, chills, nausea, or vomiting noted at this time. She actually has an angiogram schedule with Dr. dew on this coming Monday, November 11, 2018. Subsequently the patient states that she is feeling much better especially than what she was roughly 2 weeks ago. She actually had to cancel an appointment because she was feeling so poorly. No fevers, chills, nausea, or vomiting noted at this time. 11/15/18 on evaluation today patient actually is status post having had her angiogram with Dr. dew Monday, four days ago. 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 He had to go and work on several areas of the vasculature fortunately he was able to obtain no more than a 30% residual stenosis throughout post procedure. I reviewed this note today. I think this will definitely help with healing at this time. Fortunately there does not  appear to be any signs of infection and I do feel like ratio already has a better appearance to it. 11/22/18 upon evaluation today patient actually appears to be doing very well in regard to her wounds in general. The right lateral malleolus looks excellent the heel looks better in the left lateral malleolus also appears to be doing a little better. With that being said the right second toe actually appears to be open and training we been watching this is been dry and stable but now is open. 12/03/2018 Seen today for follow-up and management of multiple bilateral lower extremity wounds. New pressure injury of the great toe which is closed at this time. Wound of the right distal second toe appears larger today with deep undermining and a pocket of fluid present within the undermining region. Left and right malleolus is wounds are stable today with no signs and symptoms of infection.Denies any needs or concerns during exam today. 12/13/18 on evaluation today patient appears to be doing somewhat better in regard to her left heel ulcer. She also seems to be completely healed in regard to the right lateral malleolus ulcer. The left malleolus ulcer is smaller what unfortunately the wounds which are new over the first and second toes of the right foot are what are most concerning at this point especially the second. Both areas did require sharp debridement today. 12/20/18 on evaluation today patient's wound actually appears to be doing better in regard to left lateral ankle and her right lateral ankle continues to remain healed. The hill ulcer on the left is improved. She does have improvement noted  as well in regard to both toe ulcers. Overall I'm very pleased in this regard. No fevers, chills, nausea, or vomiting noted at this time. MAYELI, BORNHORST ([id number]) 12/23/18 on evaluation today patient is seen after she had her toenails trimmed at the podiatrist office due to issues with her right great toe.  There was what appeared to be dark eschar on the surface of the wound which had her in the podiatrist concerned. Nonetheless as I remember that during the last office visit I had utilize silver nitrate of this area I was much less concerned about the situation. Subsequently I was able to clean off much of this tissue without any complication today. This does not appear to show any signs of infection and actually look somewhat better compared to last time post debridement. Her second toe on the right foot actually had callous over and there did appear still be some fluid underneath this that would require debridement today. 12/27/18 on evaluation today patient actually appears to be showing signs of improvement at all locations. Even the left lateral ankle although this is not quite as great as the other sites. Fortunately there does not appear to be any signs of infection at this time and both of her toes on the right foot seem to be showing signs of improvement which is good news and very pleased in this regard. 01/03/19 on evaluation today patient appears to be doing better for the most part in regard to her wounds in particular. There does not appear to be any evidence of infection at this time which is good news. Fortunately there is no sign of really worsening anywhere except for the right great toe which she does have what appears to be a bruise/deep tissue injury which is very superficial and already resolving. I'm not sure where this came from I questioned her extensively and she does not recall what may have happened with this. Other than that the patient seems to be doing well even the left lateral ankle ulcer looks good and is getting smaller. 01/10/19 on evaluation today patient appears to be doing well in regard to her left heel wound and both of her toe wounds. Overall I feel like there is definitely improvement here and I'm happy in that regard. With that being said unfortunately she is  having issues with the left lateral malleolus ulcer which unfortunately still has a lot of depth to it. This is gonna be a very difficult wound for [place] to be able to truly get to heal. I may want to consider some type of skin substitute to see if this would be of benefit for her. I'll discuss this with her more the next visit most likely. This was something I thought about more at the end of the visit when I was Artie out of the room and the patient had been discharged. 01/17/19 on evaluation today patient appears to be doing very well in regard to her wounds in general. She's been making excellent progress at this time. Fortunately there's no sign of infection at this time either. No fevers, chills, nausea, or vomiting noted at this time. The biggest issue is still her left lateral malleolus where it appears to be doing well and is getting smaller but still shows a small corner where this is deeper and goes down into what appears to be the joint space. Nonetheless this is taking much longer to heal although it still looks better in smaller than previous evaluations. 01/24/19 on evaluation  today patient's wounds actually appear to be doing rather well in general overall. She did require some sharp debridement in regard to the right great toe but everything else appears to be doing excellent no debridement was even necessary. No fevers, chills, nausea, or vomiting noted at this time. 01/31/19 on evaluation today patient actually appears to be doing much better in regard to her left foot wound on the heel as well as the ankle. The right great toe appears to be a little bit worse today this had callous over and trapped a lot of fluid underneath. Fortunately there's no signs of infection at any site which is great news. 02/07/19 on evaluation today patient actually appears to be doing decently well in regard to all of her ulcers at this point. No sharp debridement was required she is a little bit of hyper  granulation in regard to the left lateral ankle as well as the left heel but the hill itself is almost completely healed which is excellent news. Overall been very pleased in this regard. 02/14/19 on evaluation today patient actually appears to be doing very well in regard to her ulcers on the right first toe, left lateral malleolus, and left heel. In fact the heel is almost completely healed at this point. The patient does not show any signs of infection which is good news. Overall very pleased with how things have progressed. 04/18/19 Telehealth Evaluation During the COVID-19 National Emergency: Verbal Consent: Obtained from patient Allergies: reviewed and the active list is current. Medication changes: patient has no current medication changes. COVID-19 Screening: 1. Have you traveled internationally or on a cruise ship in the last 14 dayso No 2. Have you had contact with someone with or under investigation for COVID-19o No 3. Have you had a fever, cough, sore throat, or experiencing shortness of breatho No on evaluation today actually did have a visit with this patient through a telehealth encounter with her home health nurse. Subsequently it was noted that the patient actually appears to be doing okay in regard to her wounds both the right great toe as well as the left lateral malleolus have shown signs of improvement although this in your theme around the left lateral malleolus there eschar coverings for both locations. The question is whether or not they are actually close and whether or not home health needs to discharge the patient or not. Nonetheless my concern is this point obviously is that without actually seeing her and being able to evaluate this directly I cannot ensure that she is completely healed which is the question that I'm being asked. 04/22/19 on evaluation today patient presents for her first evaluation since last time I saw her which was actually February 14, 2019. I did do  a telehealth visit last week in which point it was questionable whether or not she may be healed and had to bring her in today for confirmation. With that being said she does seem to be doing quite well at this point which is good news. There does not appear to be any drainage in the deed I believe her wounds may be healed. Readmission: 09/04/2019 on evaluation today patient appears to be doing unfortunately somewhat more poorly in regard to her left foot ulcer secondary to a wound that began on 08/21/2019 at least when she first noticed this. Fortunately she has not had any evidence of active infection at this time. Systemically. I also do not necessarily see any evidence of infection at the blister/wound site  on the first metatarsal head plantar aspect. This almost appears to be something that may have just rubbed inappropriately causing this to breakdown. They did not want a wait too long to come in to be seen as again she had significant issues in the past with wounds that took quite a while to heal in fact it was close to 2 years. Nonetheless this does not appear to be quite that bad but again we do need to remove some of the necrotic tissue from the surface of the wound to tell exactly the extent. She does not appear to have any significant arterial disease at this point and again her last ABIs and TBI's are recorded above in the alert section her left ABI was 1.27 with a TBI of 0.72 to the right ABI 1.08 with a TBI of 0.39. Other than this the patient has been doing quite YOSHIKA, VENSEL ([id number]) well since I last saw her and that was in May 2020. 09/11/2019 on evaluation today patient appeared to be doing very well with regard to her plantar foot ulcer on the left. In fact this appears to be almost completely healed which is awesome. That is after just 1 week of intervention. With that being said there is no signs of active infection at this time. 09/18/2019 on evaluation today patient  actually appears to be doing excellent in fact she is completely healed based on what I am seeing at this point. Fortunately there is no signs of active infection at this time and overall patient is very pleased to hear that this area has healed so quickly. Readmission: 05/13/2021 upon evaluation today this patient presents for reevaluation here in the clinic. This is a wound that actually we previously took care of. She had 1 on the right ankle and the left the left turned out to be be harder due to to heal but nonetheless is doing great at this point as the right that has reopened and it was noted first just several weeks ago with a scab over it and came off in just the past few days. Fortunately there does not appear to be any obvious evidence of significant active infection at this time which is great news. No fevers, chills, nausea, vomiting, or diarrhea. 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 There does not appear to be any signs of this interfering in any way with her wound. She does have swelling we previously had compression socks for her ordered but again it does not look like she wears these on a regular basis by any means. 05/26/2021 upon evaluation today patient appears to be doing well with regard to her wound which is actually showing signs of excellent improvement. There does not appear to be any signs of active infection which is great news and overall very pleased with where things stand today. No fevers, chills, nausea, vomiting, or diarrhea. 06/02/2021 upon evaluation today patient's wound actually showing signs of excellent improvement. Fortunately there does not appear to be any signs of active infection which is great news. I think the patient is making good progress with regard to her wounds in general. 06/09/2021 upon evaluation today patient appears to be doing excellent in regard to her wounds currently. Fortunately there does  not appear to be any signs of active infection which is great news. No fevers, chills, nausea, vomiting, or diarrhea. Overall extremely pleased with where things stand today. I think the patient is  making excellent progress. 06/16/2021 upon evaluation today patient appears to be doing well in regard to her wound. This is going require little bit of debridement today and that was discussed with the patient. Otherwise she seems to be doing quite well and I am actually very pleased with where things stand at this point. No fevers, chills, nausea, vomiting, or diarrhea. 06/23/2021 upon evaluation today patient appears to be doing well with regard to her wounds. She has been tolerating the dressing changes without complication. Fortunately there does not appear to be any evidence of infection and she has not had air in her home which she actually lives at an assisted living that got fixed this morning. With that being said because of that her wrap has been extremely hot and bothersome for her over the past week. 06/30/2021 upon evaluation today patient is actually making excellent progress in regard to her ankle ulcer. She has been tolerating the dressing changes without complication and overall extremely pleased with where things stand there does not appear to be any evidence of active infection which is great news. No fevers, chills, nausea, vomiting, or diarrhea. 07/07/21 upon evaluation today patients and culture on the right actually appears to be doing quite well. There does not appear to be any signs of infection and overall very pleased with where things stand today. No fevers, chills, nausea, or vomiting noted at this time. 07/14/2021 unfortunately the patient today has some evidence of deep tissue injury and pressure getting to the ankle region. Again I am not exactly sure what is going on here but this is very similar to issues that we have had in the past. I explained to the patient that she needs  to be very mindful of exactly what is happening I think sleeping in bed is probably the main issue here although there could be other culprits I am not sure what else would potentially lead to this kind of a problem for her. 07/21/2021 upon evaluation today patient's wound actually showing signs of improvement compared to last week. Fortunately there does not appear to be any signs of active infection which is great news and overall very pleased with where things stand in that regard. With that being said I do believe that she is continuing to show signs of overall of getting better although I think this is still basically about what we were 2 weeks ago due to the worsening and now improvement. 07/28/2021 upon evaluation today patient appears to be doing well with regard to her wound. She does have some slough buildup on the surface of the wound which I would have to manage today. Fortunately there is no sign of active infection at this time. No fevers, chills, nausea, vomiting, or diarrhea. 08/04/2021 upon evaluation today patient appears to be doing about the same in regard to her wound. To be perfectly honest I am beginning to be a little bit concerned about the overall appearance of the wound bed. I do think possibly taking a sample right around the margin of the wound could be beneficial for her as far as identifying anything such as an inflammatory process or to be honest even a skin tag cancer type process that may be of concern here. Fortunately there does not appear to be any evidence of active infection at this time which is great news she is not having any pain also great news. 08/11/2021 upon evaluation today patient appears to be doing well with regard to her wound. The good  news is I did review her biopsy results and it showed some inflammatory mixed findings but nothing that appeared to be malignant which is great news. Overall this is more of a chronic venous stasis type issue which again  is more what we have been treating. Nonetheless I just wanted to make sure before going forward that there was not anything more untoward going on at this point. 08/18/2021 upon evaluation today patient appears to be doing well with regard to her ankle ulcer. Fortunately there does not appear to be any signs of active infection at this time which is great overall wound is dramatically improved compared to last week. Since last week I have actually placed her on doxycycline and subsequently this is a good option as far as the findings are concerned at this point. 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 the doxycycline is doing that. There does not appear to be any evidence of active infection systemically which is great news. 08/25/2021 upon evaluation today patient appears to be doing well with regard to her wound. I feel like we are finally get back on track as far as healing is concerned I am much happier with the overall appearance today. I do think that she is tolerating the dressing changes without complication which is great news. We have been using Hydrofera Blue which I think is a good option. The good news is she is also doing great in [name], REBBECCA OSUNA. ([id number]) regard to her compression sock on the left which is a zipper compression that seems to be doing a great job keeping her edema under good control. Objective Constitutional Well-nourished and well-hydrated in no acute distress. Vitals Time Taken: 10:20 AM, Height: 62 in, Weight: 150 lbs, BMI: 27.4, Temperature: 98 F, Pulse: 72 bpm, Respiratory Rate: 18 breaths/min, Blood Pressure: 134/75 mmHg. Respiratory normal breathing without difficulty. Psychiatric this patient is able to make decisions and demonstrates good insight into disease process. Alert and Oriented x 3. pleasant and  cooperative. General Notes: Upon inspection patient's wound bed actually showed signs of better granulation epithelization I did perform some sharp debridement clear away necrotic debris she tolerated that today without complication and postdebridement wound bed appears to be doing much better. Integumentary (Hair, Skin) Wound #7 status is Open. Original cause of wound was Gradually Appeared. The date acquired was: 05/12/2021. The wound has been in treatment 14 weeks. The wound is located on the Right,Lateral Malleolus. The wound measures 1cm length x 1cm width x 0.3cm depth; 0.785cm^2 area and 0.236cm^3 volume. There is Fat Layer (Subcutaneous Tissue) exposed. There is no tunneling or undermining noted. There is a medium amount of serosanguineous drainage noted. The wound margin is distinct with the outline attached to the wound base. There is small (1-33%) red, pink, pale granulation within the wound bed. There is a large (67-100%) amount of necrotic tissue within the wound bed. Assessment Active Problems ICD-10 Pressure ulcer of right ankle, stage 3 Lymphedema, not elsewhere classified Venous insufficiency (chronic) (peripheral) Presence of cardiac pacemaker Long term (current) use of anticoagulants Procedures Wound #7 Pre-procedure diagnosis of Wound #7 is a Pressure Ulcer located on the Right,Lateral Malleolus . There was a Excisional Skin/Subcutaneous Tissue Debridement with a total area of 1 sq cm performed by Tommie Sams., PA-C. With the following instrument(s): Curette to remove Viable and Non-Viable tissue/material. Material  removed includes Subcutaneous Tissue, Slough, Skin: Dermis, and Skin: Epidermis. No specimens were taken. A time out was conducted at 10:45, prior to the start of the procedure. A Minimum amount of bleeding was controlled with Pressure. The procedure was tolerated well with a pain level of 0 throughout and a pain level of 0 following the procedure. Post  Debridement Measurements: 1cm length x 1cm width x 0.3cm depth; 0.236cm^3 volume. Post debridement Stage noted as Category/Stage III. Character of Wound/Ulcer Post Debridement is improved. Post procedure Diagnosis Wound #7: Same as Pre-Procedure Ishanvi, [place]qu[place] [name] ([id number]) Plan Follow-up Appointments: Return Appointment in 1 week. Bathing/ Shower/ Hygiene: May shower with wound dressing protected with water repellent cover or cast protector. Edema Control - Lymphedema / Segmental Compressive Device / Other: Optional: One layer of unna paste to top of compression wrap (to act as an anchor). - and at toes Elevate, Exercise Daily and Avoid Standing for Long Periods of Time. Elevate legs to the level of the heart and pump ankles as often as possible Elevate leg(s) parallel to the floor when sitting. WOUND #7: - Malleolus Wound Laterality: Right, Lateral Cleanser: Soap and Water 1 x Per Week/30 Days Discharge Instructions: Gently cleanse wound with antibacterial soap, rinse and pat dry prior to dressing wounds Primary Dressing: Hydrofera Blue Ready Transfer Foam, 2.5x2.5 (in/in) 1 x Per Week/30 Days Discharge Instructions: Apply Hydrofera Blue Ready to wound bed as directed Secondary Dressing: ABD Pad 5x9 (in/in) 1 x Per Week/30 Days Discharge Instructions: Cover with ABD pad Compression Wrap: Profore Lite LF 3 Multilayer Compression Bandaging System 1 x Per Week/30 Days Discharge Instructions: Apply 3 multi-layer wrap as prescribed. 1. Would recommend currently that we continue with the [facility] which I think is still a good option for her. 2. Also can recommend that we have the patient continue with the ABD pad to cover followed by 3 layer compression wrap. 3. I would also suggest the patient continue to monitor for any signs of worsening or infection if anything changes she should let me know as soon as possible. We will see patient back for reevaluation in 1 week here in the  clinic. If anything worsens or changes patient will contact our office for additional recommendations. Electronic Signature(s) Signed: 08/25/2021 2:03:54 PM By: Worthy Keeler PA-C Entered By: Worthy Keeler on 08/25/2021 14:03:54 Hinzman, [name] ([id number]) -------------------------------------------------------------------------------- SuperBill Details Patient Name: [name] Date of Service: 08/25/2021 Medical Record Number: [id number] Patient Account Number: [id number] Date of Birth/Sex: 23-Dec-1922 (87 y.o. F) Treating RN: Carlene Coria Primary Care Provider: Adrian Prows Other Clinician: Referring Provider: Adrian Prows Treating Provider/Extender: Skipper Cliche in Treatment: 14 Diagnosis Coding ICD-10 Codes Code Description [phone] Pressure ulcer of right ankle, stage 3 I89.0 Lymphedema, not elsewhere classified I87.2 Venous insufficiency (chronic) (peripheral) Z95.0 Presence of cardiac pacemaker Z79.01 Long term (current) use of anticoagulants Facility Procedures CPT4 Code: [id number] Description: 29476 - DEB SUBQ TISSUE 20 SQ CM/< Modifier: Quantity: 1 CPT4 Code: Description: ICD-10 Diagnosis Description 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, stage 3 Modifier: Quantity: Physician Procedures CPT4 Code: 5465035 Description: 46568 - WC PHYS SUBQ TISS 20 SQ CM Modifier: Quantity: 1 CPT4 Code: Description: ICD-10 Diagnosis Description 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, stage 3 Modifier: Quantity: Electronic Signature(s) Signed: 08/25/2021 2:04:04 PM By: Worthy Keeler PA-C Entered By: Worthy Keeler on 08/25/2021 14:04:03

## 2021-08-25 NOTE — Progress Notes (Signed)
FLECIA, SHUTTER ([id number]) Visit Report for 08/25/2021 Arrival Information Details Patient Name: [name], [name]. Date of Service: 08/25/2021 10:15 AM Medical Record Number: [id number] Patient Account Number: [id number] Date of Birth/Sex: [birth year] (87 y.o. F) Treating RN: Carlene Coria Primary Care Armarion [name]: Adrian Prows Other Clinician: Referring Brooklyn Alfredo: Adrian Prows Treating Jadwiga Faidley/Extender: Skipper Cliche in Treatment: 14 Visit Information History Since Last Visit All ordered tests and consults were completed: No Patient Arrived: Ambulatory Added or deleted any medications: No Arrival Time: 10:13 Any new allergies or adverse reactions: No Accompanied By: self Had a fall or experienced change in No Transfer Assistance: None activities of daily living that may affect Patient Identification Verified: Yes risk of falls: Secondary Verification Process Completed: Yes Signs or symptoms of abuse/neglect since last visito No Patient Requires Transmission-Based No Hospitalized since last visit: No Precautions: Implantable device outside of the clinic excluding No Patient Has Alerts: Yes cellular tissue based products placed in the center Patient Alerts: Patient on Blood since last visit: Thinner Has Dressing in Place as Prescribed: Yes NOT DIABETIC Pain Present Now: Yes ***ELIQUIS*** Electronic Signature(s) Signed: 08/25/2021 1:17:18 PM By: Carlene Coria RN Entered By: Carlene Coria on 08/25/2021 10:20:45 Ardolino, [name] ([id number]) -------------------------------------------------------------------------------- Clinic Level of Care Assessment Details Patient Name: [name] Date of Service: 08/25/2021 10:15 AM Medical Record Number: [id number] Patient Account Number: [id number] Date of Birth/Sex: 1923/04/05 (87 y.o. F) Treating RN: Carlene Coria Primary Care Keydi Giel: Adrian Prows Other Clinician: Referring Emanie Behan: Adrian Prows Treating Faye Sanfilippo/Extender: Skipper Cliche in Treatment: 14 Clinic Level of Care Assessment Items TOOL 1 Quantity Score '[]'  - Use when EandM and Procedure is performed on INITIAL visit 0 ASSESSMENTS - Nursing Assessment / Reassessment '[]'  - General Physical Exam (combine w/ comprehensive assessment (listed just below) when performed on new 0 pt. evals) '[]'  - 0 Comprehensive Assessment (HX, ROS, Risk Assessments, Wounds Hx, etc.) ASSESSMENTS - Wound and Skin Assessment / Reassessment '[]'  - Dermatologic / Skin Assessment (not related to wound area) 0 ASSESSMENTS - Ostomy and/or Continence Assessment and Care '[]'  - Incontinence Assessment and Management 0 '[]'  - 0 Ostomy Care Assessment and Management (repouching, etc.) PROCESS - Coordination of Care '[]'  - Simple Patient / Family Education for ongoing care 0 '[]'  - 0 Complex (extensive) Patient / Family Education for ongoing care '[]'  - 0 Staff obtains Programmer, systems, Records, Test Results / Process Orders '[]'  - 0 Staff telephones HHA, Nursing Homes / Clarify orders / etc '[]'  - 0 Routine Transfer to another Facility (non-emergent condition) '[]'  - 0 Routine Hospital Admission (non-emergent condition) '[]'  - 0 New Admissions / Biomedical engineer / Ordering NPWT, Apligraf, etc. '[]'  - 0 Emergency Hospital Admission (emergent condition) PROCESS - Special Needs '[]'  - Pediatric / Minor Patient Management 0 '[]'  - 0 Isolation Patient Management '[]'  - 0 Hearing / Language / Visual special needs '[]'  - 0 Assessment of Community assistance (transportation, D/C planning, etc.) '[]'  - 0 Additional assistance / Altered mentation '[]'  - 0 Support Surface(s) Assessment (bed, cushion, seat, etc.) INTERVENTIONS - Miscellaneous '[]'  - External ear exam 0 '[]'  - 0 Patient Transfer (multiple staff / Civil Service fast streamer / Similar devices) '[]'  - 0 Simple Staple / Suture removal (25 or less) '[]'  - 0 Complex Staple / Suture removal (26 or more) '[]'  - 0 Hypo/Hyperglycemic  Management (do not check if billed separately) '[]'  - 0 Ankle / Brachial Index (ABI) - do not check if billed separately Has the patient been seen at the hospital within  the last three years: Yes Total Score: 0 Level Of Care: ____ [name] ([id number]) Electronic Signature(s) Signed: 08/25/2021 1:17:18 PM By: Carlene Coria RN Entered By: Carlene Coria on 08/25/2021 11:00:26 Aikey, [name] ([id number]) -------------------------------------------------------------------------------- Encounter Discharge Information Details Patient Name: [name] Date of Service: 08/25/2021 10:15 AM Medical Record Number: [id number] Patient Account Number: [id number] Date of Birth/Sex: September 04, 1923 (87 y.o. F) Treating RN: Carlene Coria Primary Care Demarie Uhlig: Adrian Prows Other Clinician: Referring Joan Herschberger: Adrian Prows Treating Jannine Abreu/Extender: Skipper Cliche in Treatment: 14 Encounter Discharge Information Items Post Procedure Vitals Discharge Condition: Stable Temperature (F): 98.4 Ambulatory Status: Walker Pulse (bpm): 71 Discharge Destination: Home Respiratory Rate (breaths/min): 18 Transportation: Private Auto Blood Pressure (mmHg): 134/75 Accompanied By: self Schedule Follow-up Appointment: Yes Clinical Summary of Care: Patient Declined Electronic Signature(s) Signed: 08/25/2021 1:17:18 PM By: Carlene Coria RN Entered By: Carlene Coria on 08/25/2021 11:01:47 Harter, [name] ([id number]) -------------------------------------------------------------------------------- Lower Extremity Assessment Details Patient Name: [name] Date of Service: 08/25/2021 10:15 AM Medical Record Number: [id number] Patient Account Number: [id number] Date of Birth/Sex: May 07, 1923 (87 y.o. F) Treating RN: Carlene Coria Primary Care Brownie Nehme: Adrian Prows Other Clinician: Referring Cassidie Veiga: Adrian Prows Treating Deniah Saia/Extender: Skipper Cliche in  Treatment: 14 Edema Assessment Assessed: [Left: No] Patrice Paradise: No] Edema: [Left: Ye] [Right: s] Calf Left: Right: Point of Measurement: 33 cm From Medial Instep 28 cm Ankle Left: Right: Point of Measurement: 10 cm From Medial Instep 17 cm Vascular Assessment Pulses: Dorsalis Pedis Palpable: [Right:Yes] Electronic Signature(s) Signed: 08/25/2021 1:17:18 PM By: Carlene Coria RN Entered By: Carlene Coria on 08/25/2021 10:34:13 Kluck, [name] ([id number]) -------------------------------------------------------------------------------- Multi Wound Chart Details Patient Name: [name] Date of Service: 08/25/2021 10:15 AM Medical Record Number: [id number] Patient Account Number: [id number] Date of Birth/Sex: 1922/12/13 (87 y.o. F) Treating RN: Carlene Coria Primary Care Linn Clavin: Adrian Prows Other Clinician: Referring Xandria Gallaga: Adrian Prows Treating Bianca Raneri/Extender: Skipper Cliche in Treatment: 14 Vital Signs Height(in): 62 Pulse(bpm): 72 Weight(lbs): 150 Blood Pressure(mmHg): 134/75 Body Mass Index(BMI): 27 Temperature(F): 98 Respiratory Rate(breaths/min): 18 Photos: [N/A:N/A] Wound Location: Right, Lateral Malleolus N/A N/A Wounding Event: Gradually Appeared N/A N/A Primary Etiology: Pressure Ulcer N/A N/A Comorbid History: Cataracts, Congestive Heart Failure, N/A N/A Hypertension, Peripheral Arterial Disease, Osteoarthritis, Neuropathy Date Acquired: 05/12/2021 N/A N/A Weeks of Treatment: 14 N/A N/A Wound Status: Open N/A N/A Measurements L x W x D (cm) 1x1x0.3 N/A N/A Area (cm) : 0.785 N/A N/A Volume (cm) : 0.236 N/A N/A % Reduction in Area: 30.10% N/A N/A % Reduction in Volume: -4.90% N/A N/A Classification: Category/Stage III N/A N/A Exudate Amount: Medium N/A N/A Exudate Type: Serosanguineous N/A N/A Exudate Color: red, brown N/A N/A Wound Margin: Distinct, outline attached N/A N/A Granulation Amount: Small (1-33%) N/A N/A Granulation  Quality: Red, Pink, Pale N/A N/A Necrotic Amount: Large (67-100%) N/A N/A Exposed Structures: Fat Layer (Subcutaneous Tissue): N/A N/A Yes Fascia: No Tendon: No Muscle: No Joint: No Bone: No Epithelialization: Small (1-33%) N/A N/A Treatment Notes Electronic Signature(s) Signed: 08/25/2021 1:17:18 PM By: Carlene Coria RN Entered By: Carlene Coria on 08/25/2021 10:45:53 Canino, [name] ([id number]) -------------------------------------------------------------------------------- Multi-Disciplinary Care Plan Details Patient Name: [name] Date of Service: 08/25/2021 10:15 AM Medical Record Number: [id number] Patient Account Number: [id number] Date of Birth/Sex: 08-06-1923 (87 y.o. F) Treating RN: Carlene Coria Primary Care Cris Talavera: Adrian Prows Other Clinician: Referring Gianni Fuchs: Adrian Prows Treating Zack Crager/Extender: Skipper Cliche in Treatment: 14 Active Inactive Wound/Skin Impairment Nursing Diagnoses: Knowledge deficit related to ulceration/compromised  skin integrity Goals: Patient/caregiver will verbalize understanding of skin care regimen Date Initiated: 05/13/2021 Date Inactivated: 05/13/2021 Target Resolution Date: 06/12/2021 Goal Status: Met Ulcer/skin breakdown will have a volume reduction of 30% by week 4 Date Initiated: 05/13/2021 Date Inactivated: 06/16/2021 Target Resolution Date: 06/12/2021 Goal Status: Met Ulcer/skin breakdown will have a volume reduction of 50% by week 8 Date Initiated: 05/13/2021 Date Inactivated: 08/18/2021 Target Resolution Date: 07/13/2021 Goal Status: Unmet Unmet Reason: comorbities Ulcer/skin breakdown will have a volume reduction of 80% by week 12 Date Initiated: 05/13/2021 Date Inactivated: 08/18/2021 Target Resolution Date: 08/13/2021 Goal Status: Unmet Unmet Reason: comorbities Ulcer/skin breakdown will heal within 14 weeks Date Initiated: 05/13/2021 Target Resolution Date: 09/12/2021 Goal Status:  Active Interventions: Assess patient/caregiver ability to obtain necessary supplies Assess patient/caregiver ability to perform ulcer/skin care regimen upon admission and as needed Assess ulceration(s) every visit Notes: Electronic Signature(s) Signed: 08/25/2021 1:17:18 PM By: Carlene Coria RN Entered By: Carlene Coria on 08/25/2021 10:45:28 Batrez, [name] ([id number]) -------------------------------------------------------------------------------- Pain Assessment Details Patient Name: [name] Date of Service: 08/25/2021 10:15 AM Medical Record Number: [id number] Patient Account Number: [id number] Date of Birth/Sex: [birth year] (87 y.o. F) Treating RN: Carlene Coria Primary Care Kasra Melvin: Adrian Prows Other Clinician: Referring Savanah Bayles: Adrian Prows Treating Dimitra Woodstock/Extender: Skipper Cliche in Treatment: 14 Active Problems Location of Pain Severity and Description of Pain Patient Has Paino No Site Locations Pain Management and Medication Current Pain Management: Electronic Signature(s) Signed: 08/25/2021 1:17:18 PM By: Carlene Coria RN Entered By: Carlene Coria on 08/25/2021 10:21:34 Texeira, [name] ([id number]) -------------------------------------------------------------------------------- Patient/Caregiver Education Details Patient Name: [name] Date of Service: 08/25/2021 10:15 AM Medical Record Number: [id number] Patient Account Number: [id number] Date of Birth/Gender: [birth year] (87 y.o. F) Treating RN: Carlene Coria Primary Care Physician: Adrian Prows Other Clinician: Referring Physician: Adrian Prows Treating Physician/Extender: Skipper Cliche in Treatment: 14 Education Assessment Education Provided To: Patient Education Topics Provided Wound/Skin Impairment: Methods: Explain/Verbal Responses: State content correctly Electronic Signature(s) Signed: 08/25/2021 1:17:18 PM By: Carlene Coria RN Entered By: Carlene Coria  on 08/25/2021 11:00:42 Teater, [name] ([id number]) -------------------------------------------------------------------------------- Wound Assessment Details Patient Name: [name] Date of Service: 08/25/2021 10:15 AM Medical Record Number: [id number] Patient Account Number: [id number] Date of Birth/Sex: [birth year] (87 y.o. F) Treating RN: Carlene Coria Primary Care Shemekia Patane: Adrian Prows Other Clinician: Referring Sanda Dejoy: Adrian Prows Treating Loralei Radcliffe/Extender: Skipper Cliche in Treatment: 14 Wound Status Wound Number: 7 Primary Pressure Ulcer Etiology: Wound Location: Right, Lateral Malleolus Wound Open Wounding Event: Gradually Appeared Status: Date Acquired: 05/12/2021 Comorbid Cataracts, Congestive Heart Failure, Hypertension, Weeks Of Treatment: 14 History: Peripheral Arterial Disease, Osteoarthritis, Neuropathy Clustered Wound: No Photos Wound Measurements Length: (cm) 1 Width: (cm) 1 Depth: (cm) 0.3 Area: (cm) 0.785 Volume: (cm) 0.236 % Reduction in Area: 30.1% % Reduction in Volume: -4.9% Epithelialization: Small (1-33%) Tunneling: No Undermining: No Wound Description Classification: Category/Stage III Wound Margin: Distinct, outline attached Exudate Amount: Medium Exudate Type: Serosanguineous Exudate Color: red, brown Foul Odor After Cleansing: No Slough/Fibrino Yes Wound Bed Granulation Amount: Small (1-33%) Exposed Structure Granulation Quality: Red, Pink, Pale Fascia Exposed: No Necrotic Amount: Large (67-100%) Fat Layer (Subcutaneous Tissue) Exposed: Yes Tendon Exposed: No Muscle Exposed: No Joint Exposed: No Bone Exposed: No Treatment Notes Wound #7 (Malleolus) Wound Laterality: Right, Lateral Cleanser Soap and Water Discharge Instruction: Gently cleanse wound with antibacterial soap, rinse and pat dry prior to dressing wounds Peri-Wound Care Rodriges, [name] ([id number]) Topical Primary Dressing Hydrofera  Blue Ready Transfer Foam, 2.5x2.5 (  in/in) Discharge Instruction: Apply Hydrofera Blue Ready to wound bed as directed Secondary Dressing ABD Pad 5x9 (in/in) Discharge Instruction: Cover with ABD pad Secured With Compression Wrap Profore Lite LF 3 Multilayer Compression Bandaging System Discharge Instruction: Apply 3 multi-layer wrap as prescribed. Compression Stockings Add-Ons Electronic Signature(s) Signed: 08/25/2021 1:17:18 PM By: Carlene Coria RN Entered By: Carlene Coria on 08/25/2021 10:33:02 Estey, [name] ([id number]) -------------------------------------------------------------------------------- Vitals Details Patient Name: [name] Date of Service: 08/25/2021 10:15 AM Medical Record Number: [id number] Patient Account Number: [id number] Date of Birth/Sex: 1922-12-22 (87 y.o. F) Treating RN: Carlene Coria Primary Care Livie Vanderhoof: Adrian Prows Other Clinician: Referring Cayli Escajeda: Adrian Prows Treating Tamberly Pomplun/Extender: Skipper Cliche in Treatment: 14 Vital Signs Time Taken: 10:20 Temperature (F): 98 Height (in): 62 Pulse (bpm): 72 Weight (lbs): 150 Respiratory Rate (breaths/min): 18 Body Mass Index (BMI): 27.4 Blood Pressure (mmHg): 134/75 Reference Range: 80 - 120 mg / dl Electronic Signature(s) Signed: 08/25/2021 1:17:18 PM By: Carlene Coria RN Entered By: Carlene Coria on 08/25/2021 10:21:21

## 2021-09-01 ENCOUNTER — Other Ambulatory Visit: Payer: Self-pay

## 2021-09-01 ENCOUNTER — Encounter: Payer: Medicare Other | Admitting: Physician Assistant

## 2021-09-01 DIAGNOSIS — L89513 Pressure ulcer of right ankle, stage 3: Secondary | ICD-10-CM | POA: Diagnosis not present

## 2021-09-01 NOTE — Progress Notes (Addendum)
[name], [name] ([id number]) Visit Report for 09/01/2021 Chief Complaint Document Details Patient Name: [name], [name]. Date of Service: 09/01/2021 10:15 AM Medical Record Number: [id number] Patient Account Number: [id number] Date of Birth/Sex: 02-Jun-1923 (87 y.o. F) Treating RN: Carlene Coria Primary Care Provider: Adrian Prows Other Clinician: Referring Provider: Adrian Prows Treating Provider/Extender: Skipper Cliche in Treatment: 15 Information Obtained from: Patient Chief Complaint Right foot ulcer Electronic Signature(s) Signed: 09/01/2021 10:42:13 AM By: Worthy Keeler PA-C Entered By: Worthy Keeler on 09/01/2021 10:42:13 Winner, [name] ([id number]) -------------------------------------------------------------------------------- HPI Details Patient Name: [name] Date of Service: 09/01/2021 10:15 AM Medical Record Number: [id number] Patient Account Number: [id number] Date of Birth/Sex: 07-05-1923 (87 y.o. 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 Dr. Amalia Hailey the podiatrist saw her on 07/20/2017 and sent her to the wound center for possible hyperbaric oxygen therapy. past medical history of peripheral vascular disease, varicose veins, status post appendectomy, basal cell carcinoma excision from the left leg, cholecystectomy, pacemaker placement, right lower extremity angiography done by Dr. dew in March 2017 with placement of a stent. there is also note of a successful ablation of the right small saphenous vein done which was reviewed by ultrasound on 10/24/2016. the  patient had a right small saphenous vein ablation done on 10/20/2016. The patient has never been a smoker. She has been seen by Dr. Corene Cornea dew the vascular surgeon who most recently saw her on 06/15/2017 for evaluation of ongoing problems with right leg swelling. She had a lower extremity arterial duplex examination done(02/13/17) which showed patent distal right superficial femoral artery stent and above-the-knee popliteal stent without evidence of restenosis. The ABI was more than 1.3 on the right and more than 1.3 on the left. This was consistent with noncompressible arteries due to medial calcification. The right great toe pressure and PPG waveforms are within normal limits and the left great toe pressure and PPG waveforms are decreased. he recommended she continue to wear her compression stockings and continue with elevation. She is scheduled to have a noninvasive arterial study in the near future 08/16/2017 -- had a lower extremity arterial duplex examination done which showed patent distal right superficial femoral artery stent and above-the-knee popliteal stent without evidence of restenosis. The ABI was more than 1.3 on the right and more than 1.3 on the left. This was consistent with noncompressible arteries due to medial calcification. The right great toe pressure and PPG waveforms are within normal limits and the left great toe pressure and PPG waveforms are decreased. the x-ray of the right ankle has not yet been done 08/24/2017 -- had a right ankle x-ray -- IMPRESSION:1. No fracture, bone lesion or evidence of osteomyelitis. 2. Lateral soft tissue swelling with a soft tissue ulcer. she has not yet seen the vascular surgeon for review 08/31/17 on evaluation today patient's wound appears to be showing signs of improvement. She still with her appointment with vascular in order to review her results of her vascular study and then determine if any intervention would be recommended at that  time. No fevers, chills, nausea, or vomiting noted at this time. She has been tolerating the dressing changes without complication. 09/28/17 on evaluation today patient's  wound appears to show signs of good improvement in regard to the granulation tissue which is surfacing. There is still a layer of slough covering the wound and the posterior portion is still significantly deeper than the anterior nonetheless there has been some good sign of things moving towards the better. She is going to go back to Dr. dew for reevaluation to ensure her blood flow is still appropriate. That will be before her next evaluation with [place] next week. No fevers, chills, nausea, or vomiting noted at this time. Patient does have some discomfort rated to be a 3-4/10 depending on activity specifically cleansing the wound makes it worse. 10/05/2017 -- the patient was seen by Dr. Lucky Cowboy last week and noninvasive studies showed a normal right ABI with brisk triphasic waveforms consistent with no arterial insufficiency including normal digital pressures. The duplex showed a patent distal right SFA stent and the proximal SFA was also normal. He was pleased with her test and thought she should have enough of perfusion for normal wound healing. He would see her back in 6 months time. 12/21/17 on evaluation today patient appears to be doing fairly well in regard to her right lateral ankle wound. Unfortunately the main issue that she is expansion at this point is that she is having some issues with what appears to be some cellulitis in the right anterior shin. She has also been noting a little bit of uncomfortable feeling especially last night and her ankle area. I'm afraid that she made the developing a little bit of an infection. With that being said I think it is in the early stages. 12/28/17 on evaluation today patient's ankle appears to be doing excellent. She's making good progress at this point the cellulitis seems to have improved  after last week's evaluation. Overall she is having no significant discomfort which is excellent news. She does have an appointment with Dr. dew on March 29, 2018 for reevaluation in regard to the stent he placed. She seems to have excellent blood flow in the right lower extremity. 01/19/12 on evaluation today patient's wound appears to be doing very well. In fact she does not appear to require debridement at this point, there's no evidence of infection, and overall from the standpoint of the wound she seems to be doing very well. With that being said I believe that it may be time to switch to different dressing away from the [facility] Dressing she tells me she does have a lot going on her friend actually passed away yesterday and she's also having a lot of issues with her husband this obviously is weighing heavy on her as far as your thoughts and concerns today. 01/25/18 on evaluation today patient appears to be doing fairly well in regard to her right lateral malleolus. She has been tolerating the dressing changes without complication. Overall I feel like this is definitely showing signs of improvement as far as how the overall appearance of the wound is there's also evidence of epithelium start to migrate over the granulation tissue. In general I think that she is progressing nicely as far as the wound is concerned. The only concern she really has is whether or not we can switch to every other week visits in order to avoid having as many appointments as her daughters have a difficult time getting her to her appointments as well as the patient's husband to his he is not doing very well at this point. 02/22/18 on evaluation today patient's right lateral malleolus ulcer  appears to be doing great. She has been tolerating the dressing changes without complication. Overall you making excellent progress at this time. Patient is having no significant discomfort. MATTYE, VERDONE ([id number]) 03/15/18  on evaluation today patient appears to be doing much more poorly in regard to her right lateral ankle ulcer at this point. Unfortunately since have last seen her her husband has passed just a few days ago is obviously weighed heavily on her her daughter also had surgery well she is with her today as usual. There does not appear to be any evidence of infection she does seem to have significant contusion/deep tissue injury to the right lateral malleolus which was not noted previous when I saw her last. It's hard to tell of exactly when this injury occurred although during the time she was spending the night in the hospital this may have been most likely. 03/22/18 on evaluation today patient appears to actually be doing very well in regard to her ulcer. She did unfortunately have a setback which was noted last week however the good news is we seem to be getting back on track and in fact the wound in the core did still have some necrotic tissue which will be addressed at this point today but in general I'm seeing signs that things are on the up and up. She is glad to hear this obviously she's been somewhat concerned that due to the how her wound digressed more recently. 03/29/18 on evaluation today patient appears to be doing fairly well in regard to her right lower extremity lateral malleolus ulcer. She unfortunately does have a new area of pressure injury over the inferior portion where the wound has opened up a little bit larger secondary to the pressure she seems to be getting. She does tell me sometimes when she sleeps at night that it actually hurts and does seem to be pushing on the area little bit more unfortunately. There does not appear to be any evidence of infection which is good news. She has been tolerating the dressing changes without complication. She also did have some bruising in the left second and third toes due to the fact that she may have bump this or injured it although she has  neuropathy so she does not feel she did move recently that may have been where this came from. Nonetheless there does not appear to be any evidence of infection at this time. 04/12/18 on evaluation today patient's wound on the right lateral ankle actually appears to be doing a little bit better with a lot of necrotic docking tissue centrally loosening up in clearing away. However she does have the beginnings of a deep tissue injury on the left lateral malleolus likely due to the fact we've been trying offload the right as much as we have. I think she may benefit from an assistive soft device to help with offloading and it looks like they're looking at one of the doughnut conditions that wraps around the lower leg to offload which I think will definitely do a good job. With that being said I think we definitely need to address this issue on the left before it becomes a wound. Patient is not having significant pain. 04/19/18 on evaluation today patient appears to be doing excellent in regard to the progress she's made with her right lateral ankle ulcer. The left ankle region which did show evidence of a deep tissue injury seems to be resolving there's little fluid noted underneath and a blister there's  nothing open at this point in time overall I feel like this is progressing nicely which is good news. She does not seem to be having significant discomfort at this point which is also good news. 04/25/18-She is here in follow up evaluation for bilateral lateral malleolar ulcers. The right lateral malleolus ulcer with pale subcutaneous tissue exposure, central area of ulcer with tendon/periosteum exposed. The left lateral malleolus ulcer now with central area of nonviable tissue, otherwise deep tissue injury. She is wearing compression wraps to the left lower extremity, she will place the right lower extremity compression wraps on when she gets home. She will be out of town over the weekend and return next  week and follow-up appointment. She completed her doxycycline this morning 05/03/18 on evaluation today patient appears to be doing very well in regard to her right lateral ankle ulcer in general. At least she's showing some signs of improvement in this regard. Unfortunately she has some additional injury to the left lateral malleolus region which appears to be new likely even over the past several days. Again this determination is based on the overall appearance. With that being said the patient is obviously frustrated about this currently. 05/10/18-She is here in follow-up evaluation for bilateral lateral malleolar ulcers. She states she has purchased offloading shoes/boots and they will arrive tomorrow. She was asked to bring them in the office at next week's appointment so her provider is aware of product being utilized. She continues to sleep on right or left side, she has been encouraged to sleep on her back. The right lateral malleolus ulcer is precariously close to peri-osteum; will order xray. The left lateral malleolus ulcer is improved. Will switch back to santyl; she will follow up next week. 05/17/18 on evaluation today patient actually appears to be doing very well in regard to her malleolus her ulcers compared to last time I saw them. She does not seem to have as much in the way of contusion at this point which is great news. With that being said she does continue to have discomfort and I do believe that she is still continuing to benefit from the offloading/pressure reducing boots that were recommended. I think this is the key to trying to get this to heal up completely. 05/24/18 on evaluation today patient actually appears to be doing worse at this point in time unfortunately compared to her last week's evaluation. She is having really no increased pain which is good news unfortunately she does have more maceration in your theme and noted surrounding the right lateral ankle the left  lateral ankle is not really is erythematous I do not see signs of the overt cellulitis on that side. Unfortunately the wounds do not seem to have shown any signs of improvement since the last evaluation. She also has significant swelling especially on the right compared to previous some of this may be due to infection however also think that she may be served better while she has these wounds by compression wrapping versus continuing to use the Juxta-Lite for the time being. Especially with the amount of drainage that she is experiencing at this point. No fevers, chills, nausea, or vomiting noted at this time. 05/31/18 on evaluation today patient appears to actually be doing better in regard to her right lateral lower extremity ulcer specifically on the malleolus region. She has been tolerating the antibiotic without complication. With that being said she still continues to have issues but a little bit of redness although nothing like  she what she was experiencing previous. She still continues to pressure to her ankle area she did get the problem on offloading boots unfortunately she will not wear them she states there too uncomfortable and she can't get in and out of the bed. Nonetheless at this point her wounds seem to be continually getting worse which is not what we want I'm getting somewhat concerned about her progress and how things are going to proceed if we do not intervene in some way shape or form. I therefore had a very lengthy conversation today about offloading yet again and even made a specific suggestion for switching her to a memory foam mattress and even gave the information for a specific one that they could look at getting if it was something that they were interested in considering. She does not want to be considered for a hospital bed air mattress although honestly insurance would not cover it that she does not have any wounds on her trunk. 06/14/18 on evaluation today both wounds  over the bilateral lateral malleolus her ulcers appear to be doing better there's no evidence of pressure injury at this point. She did get the foam mattress for her bed and this does seem to have been extremely beneficial for her in my pinion. Her daughter states that she is having difficulty getting out of bed because of how soft it is. The patient also relates this to be. Nonetheless I do feel like she's actually doing better. Unfortunately right after and around the time she was getting the mattress she also sustained a fall when she got up to go pick up the phone and ended up injuring her right elbow she has [address] sut[address] place. We are not caring for this currently although home health is going to be taking the sutures out shortly. Nonetheless this may be something that we need to evaluate going forward. It depends on how well it has or has not healed in the end. She also recently saw an orthopedic specialist for an injection in the right shoulder just before her fall unfortunately the fall seems to have worsened her pain. 06/21/18 on evaluation today patient appears to be doing about the same in regard to her lateral malleolus ulcers. Both appear to be just a little bit deeper but again we are clinging away the necrotic and dead tissue which I think is why this is progressing towards a deeper realm as opposed MONCHEL, POLLITT. ([id number]) to improving from my measurement standpoint in that regard. Nonetheless she has been tolerating the dressing changes she absolutely hates the memory foam mattress topper that was obtained for her nonetheless I do believe this is still doing excellent as far as taking care of excess pressure in regard to the lateral malleolus regions. She in fact has no pressure injury that I see whereas in weeks past it was week by week I was constantly seeing new pressure injuries. Overall I think it has been very beneficial for her. 07/03/18; patient arrives in my clinic  today. She has deep punched out areas over her bilateral lateral malleoli. The area on the right has some more depth. We spent a lot of time today talking about pressure relief for these areas. This started when her daughter asked for a prescription for a memory foam mattress. I have never written a prescription for a mattress and I don't think insurances would pay for that on an ordinary bed. In any case he came up that she has foam  boots that she refuses to wear. I would suggest going to these before any other offloading issues when she is in bed. They say she is meticulous about offloading this the rest of the day 07/10/18- She is seen in follow-up evaluation for bilateral, lateral malleolus ulcers. There is no improvement in the ulcers. She has purchased and is sleeping on a memory foam mattress/overlay, she has been using the offloading boots nightly over the past week. She has a follow up appointment with vascular medicine at the end of October, in my opinion this follow up should be expedited given her deterioration and suboptimal TBI results. We will order plain film xray of the left ankle as deeper structures are palpable; would consider having MRI, regardless of xray report(s). The ulcers will be treated with iodoflex/iodosorb, she is unable to safely change the dressings daily with santyl. 07/19/18 on evaluation today patient appears to be doing in general visually well in regard to her bilateral lateral malleolus ulcers. She has been tolerating the dressing changes without complication which is good news. With that being said we did have an x-ray performed on 07/12/18 which revealed a slight loosen see in the lateral portion of the distal left fibula which may represent artifact but underline lytic destruction or osteomyelitis could not be excluded. MRI was recommended. With that being said we can see about getting the patient scheduled for an MRI to further evaluate this area. In fact we have  that scheduled currently for August 20 19,019. 07/26/18 on evaluation today patient's wound on the right lateral ankle actually appears to be doing fairly well at this point in my pinion. She has made some good progress currently. With that being said unfortunately in regard to the left lateral ankle ulcer this seems to be a little bit more problematic at this time. In fact as I further evaluated the situation she actually had bone exposed which is the first time that's been the case in the bone appear to be necrotic. Currently I did review patient's note from Dr. Bunnie Domino office with [facility]. He stated that ABI was 1.26 on the right and 0.95 on the left with good waveforms. Her perfusion is stable not reduced from previous studies and her digital waveforms were pretty good particularly on the right. His conclusion upon review of the note was that there was not much she could do to improve her perfusion and he felt she was adequate for wound healing. His suggestion was that she continued to see [place] and consider a synthetic skin graft if there was no underlying infection. He plans to see her back in six months or as needed. 08/01/18 on evaluation today patient appears to be doing better in regard to her right lateral ankle ulcer. Her left lateral ankle ulcer is about the same she still has bone involvement in evidence of necrosis. There does not appear to be evidence of infection at this time On the right lateral lower extremity. I have started her on the Augmentin she picked this up and started this yesterday. This is to get her through until she sees infectious disease which is scheduled for 08/12/18. 08/06/18 on evaluation today patient appears to be doing rather well considering my discussion with patient's daughter at the end of last week. The area which was marked where she had erythema seems to be improved and this is good news. With that being said overall the patient seems  to be making good improvement when it  comes to the overall appearance of the right lateral ankle ulcer although this has been slow she at least is coming around in this regard. Unfortunately in regard to the left lateral ankle ulcer this is osteomyelitis based on the pathology report as well is bone culture. Nonetheless we are still waiting CT scan. Unfortunately the MRI we originally ordered cannot be performed as the patient is a pacemaker which I had overlooked. Nonetheless we are working on the CT scan approval and scheduling as of now. She did go to the hospital over the weekend and was placed on IV Cefzo for a couple of days. Fortunately this seems to have improved the erythema quite significantly which is good news. There does not appear to be any evidence of worsening infection at this time. She did have some bleeding after the last debridement therefore I did not perform any sharp debridement in regard to left lateral ankle at this point. Patient has been approved for a snap vac for the right lateral ankle. 08/14/18; the patient with wounds over her bilateral lateral malleoli. The area on the right actually looks quite good. Been using a snap back on this area. Healthy granulation and appears to be filling in. Unfortunately the area on the left is really problematic. 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 Also noted to have cellulitis. She saw Dr. Novella Olive of infectious disease today and was put on linezolid. We are able to verify this with her pharmacy. She is completed the Augmentin that she was already on. We've been using Iodoflex to this area 08/23/18 on evaluation today patient's wounds both actually appear to be doing better compared to my prior evaluations. Fortunately she showing signs of good improvement in regard to the overall wound status especially where were using the snap vac on the right. In  regard to left lateral malleolus the wound bed actually appears to be much cleaner than previously noted. I do not feel any phone directly probed during evaluation today and though there is tendon noted this does not appear to be necrotic it's actually fairly good as far as the overall appearance of the tendon is concerned. In general the wound bed actually appears to be doing significantly better than it was previous. Patient is currently in the care of [name] and I did review that note today. He actually has her on two weeks of linezolid and then following the patient will be on 1-2 months of Keflex. That is the plan currently. She has been on antibiotics therapy as prescribed by myself initially starting on July 30, 2018 and has been on that continuously up to this point. 08/30/18 on evaluation today patient actually appears to be doing much better in regard to her right lateral malleolus ulcer. She has been tolerating the dressing changes specifically the snap vac without complication although she did have some issues with the seal currently. Apparently there was some trouble with getting it to maintain over the past week past Sunday. Nonetheless overall the wound appears better in regard to the right lateral malleolus region. In regard to left lateral malleolus this actually show some signs of additional granulation although there still tendon noted in the base of the wound this appears to be healthy not necrotic in any way whatsoever. We are considering potentially using a snap vac for the left lateral malleolus as well the product wrap from [employer], [place], was present in  the clinic today we're going to see this patient I did have her come in with me after obtaining consent from the patient and her daughter in order to look at the wound and see if there's any recommendation one way or another as to whether or not they felt the snapback could be beneficial for the left lateral malleolus region.  But the conclusion was that it might be but that this is definitely a little bit deeper wound than what traditionally would be utilized for a snap vac. 09/06/18 on evaluation today patient actually appears to be doing excellent in my pinion in regard to both ankle ulcers. She has been tolerating the dressing changes without complication which is great news. Specifically we have been using the snap vac. In regard to the right ankle I'm not even sure that this is going to be necessary for today and following as the wound has filled in quite nicely. In regard to the left ankle I do believe SEASON, ASTACIO ([id number]) that we're seeing excellent epithelialization from the edge as well as granulation in the central portion the tendon is still exposed but there's no evidence of necrotic bone and in general I feel like the patient has made excellent progress even compared to last week with just one week of the snap vac. 09/11/18; this is a patient who has wounds on her bilateral lateral malleoli. Initially both of these were deep stage IV wounds in the setting of chronic arterial insufficiency. She has been revascularized. As I understand think she been using snap vacs to both of these wounds however the area on the right became more superficial and currently she is only using it on the left. Using silver collagen on the right and silver collagen under the back on the left I believe 09/19/18 on evaluation today patient actually appears to be doing very well in regard to her lateral malleolus or ulcers bilaterally. She has been tolerating the dressing changes without complication. Fortunately there does not appear to be any evidence of infection at this time. Overall I feel like she is improving in an excellent manner and I'm very pleased with the fact that everything seems to be turning towards the better for her. This has obviously been a long road. 09/27/18 on evaluation today patient actually appears  to be doing very well in regard to her bilateral lateral malleolus ulcers. She has been tolerating the dressing changes without complication. Fortunately there does not appear to be any evidence of infection at this time which is also great news. No fevers, chills, nausea, or vomiting noted at this time. Overall I feel like she is doing excellent with the snap vac on the left malleolus. She had 40 mL of fluid collection over the past week. 10/04/18 on evaluation today patient actually appears to be doing well in regard to her bilateral lateral malleolus ulcers. She continues to tolerate the dressing changes without complication. One issue that I see is the snap vac on the left lateral malleolus which appears to have sealed off some fluid underlying this area and has not really allowed it to heal to the degree that I would like to see. For that reason I did suggest at this point we may want to pack a small piece of packing strip into this region to allow it to more effectively wick out fluid. 10/11/18 in general the patient today does not feel that she has been doing very well. She's been a little bit lethargic and  subsequently is having bodyaches as well according to what she tells me today. With that being said overall she has been concerned with the fact that something may be worsening although to be honest her wounds really have not been appearing poorly. She does have a new ulcer on her left heel unfortunately. This may be pressure related. Nonetheless it seems to me to have potentially started at least as a blister I do not see any evidence of deep tissue injury. In regard to the left ankle the snap vac still seems to be causing the ceiling off of the deeper part of the wound which is in turn trapping fluid. I'm not extremely pleased with the overall appearance as far as progress from last week to this week therefore I'm gonna discontinue the snap vac at this point. 10/18/18 patient unfortunately  this point has not been feeling well for the past several days. She was seen by Grayland Ormond her primary care provider who is a Librarian, academic at [facility]. Subsequently she states that she's been very weak and generally feeling malaise. No fevers, chills, nausea, or vomiting noted at this time. With that being said bloodwork was performed at the PCP office on the 11th of this month which showed a white blood cell count of 10.7. This was repeated today and shows a white blood cell count of 12.4. This does show signs of worsening. Coupled with the fact that she is feeling worse and that her left ankle wound is not really showing signs of improvement I feel like this is an indication that the osteomyelitis is likely exacerbating not improving. Overall I think we may also want to check her C-reactive protein and sedimentation rate. Actually did call [facility] office this afternoon while the patient was in the office here with me. Subsequently based on the findings we discussed treatment possibilities and I think that it is appropriate for [place] to go ahead and initiate treatment with doxycycline which I'm going to do. Subsequently he did agree to see about adding a CRP and sedimentation rate to her orders. If that has not already been drawn to where they can run it they will contact the patient she can come back to have that check. They are in agreement with plan as far as the patient and her daughter are concerned. Nonetheless also think we need to get in touch with Dr. Henreitta Leber office to see about getting the patient scheduled with him as soon as possible. 11/08/18 on evaluation today patient presents for follow-up concerning her bilateral foot and ankle ulcers. I did do an extensive review of her chart in epic today. Subsequently she was seen by [name] he did initiate Cefepime IV antibiotic therapy. Subsequently she had some issues with her PICC line this had to be removed because it  was coiled and then replaced. Fortunately that was now settled. Unfortunately she has continued have issues with her left heel as well as the issues that she is experiencing with her bilateral lateral malleolus regions. I do believe however both areas seem to be doing a little bit better on evaluation today which is good news. No fevers, chills, nausea, or vomiting noted at this time. She actually has an angiogram schedule with Dr. dew on this coming Monday, November 11, 2018. Subsequently the patient states that she is feeling much better especially than what she was roughly 2 weeks ago. She actually had to cancel an appointment because she was feeling so poorly. No fevers,  chills, nausea, or vomiting noted at this time. 11/15/18 on evaluation today patient actually is status post having had her angiogram with Dr. dew Monday, four days ago. 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 He had to go and work on several areas of the vasculature fortunately he was able to obtain no more than a 30% residual stenosis throughout post procedure. I reviewed this note today. I think this will definitely help with healing at this time. Fortunately there does not appear to be any signs of infection and I do feel like ratio already has a better appearance to it. 11/22/18 upon evaluation today patient actually appears to be doing very well in regard to her wounds in general. The right lateral malleolus looks excellent the heel looks better in the left lateral malleolus also appears to be doing a little better. With that being said the right second toe actually appears to be open and training we been watching this is been dry and stable but now is open. 12/03/2018 Seen today for follow-up and management of multiple bilateral lower extremity wounds. New pressure injury of the great toe which is closed at this time. Wound of the right distal second toe appears larger today with deep undermining  and a pocket of fluid present within the undermining region. Left and right malleolus is wounds are stable today with no signs and symptoms of infection.Denies any needs or concerns during exam today. 12/13/18 on evaluation today patient appears to be doing somewhat better in regard to her left heel ulcer. She also seems to be completely healed in regard to the right lateral malleolus ulcer. The left malleolus ulcer is smaller what unfortunately the wounds which are new over the first and second toes of the right foot are what are most concerning at this point especially the second. Both areas did require sharp debridement today. 12/20/18 on evaluation today patient's wound actually appears to be doing better in regard to left lateral ankle and her right lateral ankle continues to remain healed. The hill ulcer on the left is improved. She does have improvement noted as well in regard to both toe ulcers. Overall I'm very pleased in this regard. No fevers, chills, nausea, or vomiting noted at this time. 12/23/18 on evaluation today patient is seen after she had her toenails trimmed at the podiatrist office due to issues with her right great toe. There was what appeared to be dark eschar on the surface of the wound which had her in the podiatrist concerned. Nonetheless as I remember that during the last office visit I had utilize silver nitrate of this area I was much less concerned about the situation. Subsequently I was able to clean off much of this tissue without any complication today. This does not appear to show any signs of infection and actually look somewhat better Somera, [name] ([id number]) compared to last time post debridement. Her second toe on the right foot actually had callous over and there did appear still be some fluid underneath this that would require debridement today. 12/27/18 on evaluation today patient actually appears to be showing signs of improvement at all locations. Even  the left lateral ankle although this is not quite as great as the other sites. Fortunately there does not appear to be any signs of infection at this time and both of her toes on the right foot seem to be showing signs of improvement which is good news and very pleased  in this regard. 01/03/19 on evaluation today patient appears to be doing better for the most part in regard to her wounds in particular. There does not appear to be any evidence of infection at this time which is good news. Fortunately there is no sign of really worsening anywhere except for the right great toe which she does have what appears to be a bruise/deep tissue injury which is very superficial and already resolving. I'm not sure where this came from I questioned her extensively and she does not recall what may have happened with this. Other than that the patient seems to be doing well even the left lateral ankle ulcer looks good and is getting smaller. 01/10/19 on evaluation today patient appears to be doing well in regard to her left heel wound and both of her toe wounds. Overall I feel like there is definitely improvement here and I'm happy in that regard. With that being said unfortunately she is having issues with the left lateral malleolus ulcer which unfortunately still has a lot of depth to it. This is gonna be a very difficult wound for [place] to be able to truly get to heal. I may want to consider some type of skin substitute to see if this would be of benefit for her. I'll discuss this with her more the next visit most likely. This was something I thought about more at the end of the visit when I was Artie out of the room and the patient had been discharged. 01/17/19 on evaluation today patient appears to be doing very well in regard to her wounds in general. She's been making excellent progress at this time. Fortunately there's no sign of infection at this time either. No fevers, chills, nausea, or vomiting noted at this  time. The biggest issue is still her left lateral malleolus where it appears to be doing well and is getting smaller but still shows a small corner where this is deeper and goes down into what appears to be the joint space. Nonetheless this is taking much longer to heal although it still looks better in smaller than previous evaluations. 01/24/19 on evaluation today patient's wounds actually appear to be doing rather well in general overall. She did require some sharp debridement in regard to the right great toe but everything else appears to be doing excellent no debridement was even necessary. No fevers, chills, nausea, or vomiting noted at this time. 01/31/19 on evaluation today patient actually appears to be doing much better in regard to her left foot wound on the heel as well as the ankle. The right great toe appears to be a little bit worse today this had callous over and trapped a lot of fluid underneath. Fortunately there's no signs of infection at any site which is great news. 02/07/19 on evaluation today patient actually appears to be doing decently well in regard to all of her ulcers at this point. No sharp debridement was required she is a little bit of hyper granulation in regard to the left lateral ankle as well as the left heel but the hill itself is almost completely healed which is excellent news. Overall been very pleased in this regard. 02/14/19 on evaluation today patient actually appears to be doing very well in regard to her ulcers on the right first toe, left lateral malleolus, and left heel. In fact the heel is almost completely healed at this point. The patient does not show any signs of infection which is good news. Overall very  pleased with how things have progressed. 04/18/19 Telehealth Evaluation During the COVID-19 National Emergency: Verbal Consent: Obtained from patient Allergies: reviewed and the active list is current. Medication changes: patient has no current  medication changes. COVID-19 Screening: 1. Have you traveled internationally or on a cruise ship in the last 14 dayso No 2. Have you had contact with someone with or under investigation for COVID-19o No 3. Have you had a fever, cough, sore throat, or experiencing shortness of breatho No on evaluation today actually did have a visit with this patient through a telehealth encounter with her home health nurse. Subsequently it was noted that the patient actually appears to be doing okay in regard to her wounds both the right great toe as well as the left lateral malleolus have shown signs of improvement although this in your theme around the left lateral malleolus there eschar coverings for both locations. The question is whether or not they are actually close and whether or not home health needs to discharge the patient or not. Nonetheless my concern is this point obviously is that without actually seeing her and being able to evaluate this directly I cannot ensure that she is completely healed which is the question that I'm being asked. 04/22/19 on evaluation today patient presents for her first evaluation since last time I saw her which was actually February 14, 2019. I did do a telehealth visit last week in which point it was questionable whether or not she may be healed and had to bring her in today for confirmation. With that being said she does seem to be doing quite well at this point which is good news. There does not appear to be any drainage in the deed I believe her wounds may be healed. Readmission: 09/04/2019 on evaluation today patient appears to be doing unfortunately somewhat more poorly in regard to her left foot ulcer secondary to a wound that began on 08/21/2019 at least when she first noticed this. Fortunately she has not had any evidence of active infection at this time. Systemically. I also do not necessarily see any evidence of infection at the blister/wound site on the first  metatarsal head plantar aspect. This almost appears to be something that may have just rubbed inappropriately causing this to breakdown. They did not want a wait too long to come in to be seen as again she had significant issues in the past with wounds that took quite a while to heal in fact it was close to 2 years. Nonetheless this does not appear to be quite that bad but again we do need to remove some of the necrotic tissue from the surface of the wound to tell exactly the extent. She does not appear to have any significant arterial disease at this point and again her last ABIs and TBI's are recorded above in the alert section her left ABI was 1.27 with a TBI of 0.72 to the right ABI 1.08 with a TBI of 0.39. Other than this the patient has been doing quite well since I last saw her and that was in May 2020. 09/11/2019 on evaluation today patient appeared to be doing very well with regard to her plantar foot ulcer on the left. In fact this appears to be almost completely healed which is awesome. That is after just 1 week of intervention. With that being said there is no signs of active infection at this time. AMMY, LIENHARD ([id number]) 09/18/2019 on evaluation today patient actually appears to be doing  excellent in fact she is completely healed based on what I am seeing at this point. Fortunately there is no signs of active infection at this time and overall patient is very pleased to hear that this area has healed so quickly. Readmission: 05/13/2021 upon evaluation today this patient presents for reevaluation here in the clinic. This is a wound that actually we previously took care of. She had 1 on the right ankle and the left the left turned out to be be harder due to to heal but nonetheless is doing great at this point as the right that has reopened and it was noted first just several weeks ago with a scab over it and came off in just the past few days. Fortunately there does not appear to  be any obvious evidence of significant active infection at this time which is great news. No fevers, chills, nausea, vomiting, or diarrhea. 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 There does not appear to be any signs of this interfering in any way with her wound. She does have swelling we previously had compression socks for her ordered but again it does not look like she wears these on a regular basis by any means. 05/26/2021 upon evaluation today patient appears to be doing well with regard to her wound which is actually showing signs of excellent improvement. There does not appear to be any signs of active infection which is great news and overall very pleased with where things stand today. No fevers, chills, nausea, vomiting, or diarrhea. 06/02/2021 upon evaluation today patient's wound actually showing signs of excellent improvement. Fortunately there does not appear to be any signs of active infection which is great news. I think the patient is making good progress with regard to her wounds in general. 06/09/2021 upon evaluation today patient appears to be doing excellent in regard to her wounds currently. Fortunately there does not appear to be any signs of active infection which is great news. No fevers, chills, nausea, vomiting, or diarrhea. Overall extremely pleased with where things stand today. I think the patient is making excellent progress. 06/16/2021 upon evaluation today patient appears to be doing well in regard to her wound. This is going require little bit of debridement today and that was discussed with the patient. Otherwise she seems to be doing quite well and I am actually very pleased with where things stand at this point. No fevers, chills, nausea, vomiting, or diarrhea. 06/23/2021 upon evaluation today patient appears to be doing well with regard to her wounds. She has been tolerating the dressing changes without complication.  Fortunately there does not appear to be any evidence of infection and she has not had air in her home which she actually lives at an assisted living that got fixed this morning. With that being said because of that her wrap has been extremely hot and bothersome for her over the past week. 06/30/2021 upon evaluation today patient is actually making excellent progress in regard to her ankle ulcer. She has been tolerating the dressing changes without complication and overall extremely pleased with where things stand there does not appear to be any evidence of active infection which is great news. No fevers, chills, nausea, vomiting, or diarrhea. 07/07/21 upon evaluation today patients and culture on the right actually appears to be doing quite well. There does not appear to be any signs of infection and overall very pleased with where things stand today. No fevers,  chills, nausea, or vomiting noted at this time. 07/14/2021 unfortunately the patient today has some evidence of deep tissue injury and pressure getting to the ankle region. Again I am not exactly sure what is going on here but this is very similar to issues that we have had in the past. I explained to the patient that she needs to be very mindful of exactly what is happening I think sleeping in bed is probably the main issue here although there could be other culprits I am not sure what else would potentially lead to this kind of a problem for her. 07/21/2021 upon evaluation today patient's wound actually showing signs of improvement compared to last week. Fortunately there does not appear to be any signs of active infection which is great news and overall very pleased with where things stand in that regard. With that being said I do believe that she is continuing to show signs of overall of getting better although I think this is still basically about what we were 2 weeks ago due to the worsening and now improvement. 07/28/2021 upon evaluation  today patient appears to be doing well with regard to her wound. She does have some slough buildup on the surface of the wound which I would have to manage today. Fortunately there is no sign of active infection at this time. No fevers, chills, nausea, vomiting, or diarrhea. 08/04/2021 upon evaluation today patient appears to be doing about the same in regard to her wound. To be perfectly honest I am beginning to be a little bit concerned about the overall appearance of the wound bed. I do think possibly taking a sample right around the margin of the wound could be beneficial for her as far as identifying anything such as an inflammatory process or to be honest even a skin tag cancer type process that may be of concern here. Fortunately there does not appear to be any evidence of active infection at this time which is great news she is not having any pain also great news. 08/11/2021 upon evaluation today patient appears to be doing well with regard to her wound. The good news is I did review her biopsy results and it showed some inflammatory mixed findings but nothing that appeared to be malignant which is great news. Overall this is more of a chronic venous stasis type issue which again is more what we have been treating. Nonetheless I just wanted to make sure before going forward that there was not anything more untoward going on at this point. 08/18/2021 upon evaluation today patient appears to be doing well with regard to her ankle ulcer. Fortunately there does not appear to be any signs of active infection at this time which is great overall wound is dramatically improved compared to last week. Since last week I have actually placed her on doxycycline and subsequently this is a good option as far as the findings are concerned at this point. I do believe that the positive result of MRSA is definitely something that needed to be addressed and the good news is The doxycycline is doing a good job of doing  this. the doxycycline is doing that. There does not appear to be any evidence of active infection systemically which is great news. 08/25/2021 upon evaluation today patient appears to be doing well with regard to her wound. I feel like we are finally get back on track as far as healing is concerned I am much happier with the overall appearance today. I  do think that she is tolerating the dressing changes without complication which is great news. We have been using Hydrofera Blue which I think is a good option. The good news is she is also doing great in regard to her compression sock on the left which is a zipper compression that seems to be doing a great job keeping her edema under good control. 09/01/2021 upon evaluation today patient appears to be doing well with regard to her wound. Infection seems to be under much better control which is great news and very pleased in that regard. Fortunately there does not appear to be any signs of infection currently. LIESL, SIMONS ([id number]) Electronic Signature(s) Signed: 09/01/2021 10:49:20 AM By: Worthy Keeler PA-C Entered By: Worthy Keeler on 09/01/2021 10:49:20 Salinger, [name] ([id number]) -------------------------------------------------------------------------------- Physical Exam Details Patient Name: [name] Date of Service: 09/01/2021 10:15 AM Medical Record Number: [id number] Patient Account Number: [id number] Date of Birth/Sex: 1923/09/09 (87 y.o. F) Treating RN: Carlene Coria Primary Care Provider: Adrian Prows Other Clinician: Referring Provider: Adrian Prows Treating Provider/Extender: Skipper Cliche in Treatment: 40 Constitutional Well-nourished and well-hydrated in no acute distress. Respiratory normal breathing without difficulty. Psychiatric this patient is able to make decisions and demonstrates good insight into disease process. Alert and Oriented x 3. pleasant and  cooperative. Notes Patient's wound bed showed signs of good granulation epithelization the wound appears to be somewhat dry with the Hydrofera Blue to it. I think for this reason we may want to go and switch to the collagen at this point in order to help with the moisture content of the wound bed as well as hopefully new granular tissue growth. Electronic Signature(s) Signed: 09/01/2021 10:50:02 AM By: Worthy Keeler PA-C Entered By: Worthy Keeler on 09/01/2021 10:50:02 Cromartie, [name] ([id number]) -------------------------------------------------------------------------------- Physician Orders Details Patient Name: [name] Date of Service: 09/01/2021 10:15 AM Medical Record Number: [id number] Patient Account Number: [id number] Date of Birth/Sex: 1923/06/10 (87 y.o. F) Treating RN: Carlene Coria Primary Care Provider: Adrian Prows Other Clinician: Referring Provider: Adrian Prows Treating Provider/Extender: Skipper Cliche in Treatment: 15 Verbal / Phone Orders: No Diagnosis Coding ICD-10 Coding Code Description 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, stage 3 I89.0 Lymphedema, not elsewhere classified I87.2 Venous insufficiency (chronic) (peripheral) Z95.0 Presence of cardiac pacemaker Z79.01 Long term (current) use of anticoagulants Follow-up Appointments o Return Appointment in 1 week. Bathing/ Shower/ Hygiene o May shower with wound dressing protected with water repellent cover or cast protector. Edema Control - Lymphedema / Segmental Compressive Device / Other o Optional: One layer of unna paste to top of compression wrap (to act as an anchor). - and at toes o Elevate, Exercise Daily and Avoid Standing for Long Periods of Time. o Elevate legs to the level of the heart and pump ankles as often as possible o Elevate leg(s) parallel to the floor when sitting. Wound Treatment Wound #7 - Malleolus Wound Laterality: Right, Lateral Cleanser:  Soap and Water 1 x Per Week/30 Days Discharge Instructions: Gently cleanse wound with antibacterial soap, rinse and pat dry prior to dressing wounds Primary Dressing: Prisma 4.34 (in) 1 x Per Week/30 Days Discharge Instructions: Moisten w/normal saline or sterile water; Cover wound as directed. Do not remove from wound bed. Secondary Dressing: ABD Pad 5x9 (in/in) 1 x Per Week/30 Days Discharge Instructions: Cover with ABD pad Compression Wrap: Profore Lite LF 3 Multilayer Compression Bandaging System 1 x Per Week/30 Days Discharge Instructions: Apply 3 multi-layer wrap  as prescribed. Electronic Signature(s) Signed: 09/01/2021 5:08:20 PM By: Carlene Coria RN Signed: 09/01/2021 5:23:54 PM By: Worthy Keeler PA-C Entered By: Carlene Coria on 09/01/2021 10:47:11 Linhardt, [name] ([id number]) -------------------------------------------------------------------------------- Problem List Details Patient Name: [name] Date of Service: 09/01/2021 10:15 AM Medical Record Number: [id number] Patient Account Number: [id number] Date of Birth/Sex: 1923/08/22 (87 y.o. F) Treating RN: Carlene Coria Primary Care Provider: Adrian Prows Other Clinician: Referring Provider: Adrian Prows Treating Provider/Extender: Skipper Cliche in Treatment: 15 Active Problems ICD-10 Encounter Code Description Active Date MDM Diagnosis 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, stage 3 05/13/2021 No Yes I89.0 Lymphedema, not elsewhere classified 05/13/2021 No Yes I87.2 Venous insufficiency (chronic) (peripheral) 05/13/2021 No Yes Z95.0 Presence of cardiac pacemaker 05/13/2021 No Yes Z79.01 Long term (current) use of anticoagulants 05/13/2021 No Yes Inactive Problems Resolved Problems Electronic Signature(s) Signed: 09/01/2021 10:42:06 AM By: Worthy Keeler PA-C Entered By: Worthy Keeler on 09/01/2021 10:42:06 Sobecki, [name]  ([id number]) -------------------------------------------------------------------------------- Progress Note Details Patient Name: [name] Date of Service: 09/01/2021 10:15 AM Medical Record Number: [id number] Patient Account Number: [id number] Date of Birth/Sex: [birth year] (87 y.o. 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 Dr. Amalia Hailey the podiatrist saw her on 07/20/2017 and sent her to the wound center for possible hyperbaric oxygen therapy. past medical history of peripheral vascular disease, varicose veins, status post appendectomy, basal cell carcinoma excision from the left leg, cholecystectomy, pacemaker placement, right lower extremity angiography done by Dr. dew in March 2017 with placement of a stent. there is also note of a successful ablation of the right small saphenous vein done which was reviewed by ultrasound on 10/24/2016. the patient had a right small saphenous vein ablation done on 10/20/2016. The patient has never been a smoker. She has been seen by Dr. Corene Cornea dew the vascular surgeon who most recently saw her on 06/15/2017 for evaluation of ongoing problems with right leg swelling. She had a lower extremity arterial duplex examination done(02/13/17) which showed patent distal right superficial femoral artery stent and above-the-knee popliteal stent without evidence of restenosis. The ABI was more than 1.3 on the right and more than 1.3 on the left. This was consistent with noncompressible arteries due to medial  calcification. The right great toe pressure and PPG waveforms are within normal limits and the left great toe pressure and PPG waveforms are decreased. he recommended she continue to wear her compression stockings and continue with elevation. She is scheduled to have a noninvasive arterial study in the near future 08/16/2017 -- had a lower extremity arterial duplex examination done which showed patent distal right superficial femoral artery stent and above-the-knee popliteal stent without evidence of restenosis. The ABI was more than 1.3 on the right and more than 1.3 on the left. This was consistent with noncompressible arteries due to medial calcification. The right great toe pressure and PPG waveforms are within normal limits and the left great toe pressure and PPG waveforms are decreased. the x-ray of the right ankle has not yet been done 08/24/2017 -- had a right ankle x-ray -- IMPRESSION:1. No fracture, bone lesion or evidence of osteomyelitis. 2. Lateral  soft tissue swelling with a soft tissue ulcer. she has not yet seen the vascular surgeon for review 08/31/17 on evaluation today patient's wound appears to be showing signs of improvement. She still with her appointment with vascular in order to review her results of her vascular study and then determine if any intervention would be recommended at that time. No fevers, chills, nausea, or vomiting noted at this time. She has been tolerating the dressing changes without complication. 09/28/17 on evaluation today patient's wound appears to show signs of good improvement in regard to the granulation tissue which is surfacing. There is still a layer of slough covering the wound and the posterior portion is still significantly deeper than the anterior nonetheless there has been some good sign of things moving towards the better. She is going to go back to Dr. dew for reevaluation to ensure her blood flow is still appropriate. That will be before  her next evaluation with [place] next week. No fevers, chills, nausea, or vomiting noted at this time. Patient does have some discomfort rated to be a 3-4/10 depending on activity specifically cleansing the wound makes it worse. 10/05/2017 -- the patient was seen by Dr. Lucky Cowboy last week and noninvasive studies showed a normal right ABI with brisk triphasic waveforms consistent with no arterial insufficiency including normal digital pressures. The duplex showed a patent distal right SFA stent and the proximal SFA was also normal. He was pleased with her test and thought she should have enough of perfusion for normal wound healing. He would see her back in 6 months time. 12/21/17 on evaluation today patient appears to be doing fairly well in regard to her right lateral ankle wound. Unfortunately the main issue that she is expansion at this point is that she is having some issues with what appears to be some cellulitis in the right anterior shin. She has also been noting a little bit of uncomfortable feeling especially last night and her ankle area. I'm afraid that she made the developing a little bit of an infection. With that being said I think it is in the early stages. 12/28/17 on evaluation today patient's ankle appears to be doing excellent. She's making good progress at this point the cellulitis seems to have improved after last week's evaluation. Overall she is having no significant discomfort which is excellent news. She does have an appointment with Dr. dew on March 29, 2018 for reevaluation in regard to the stent he placed. She seems to have excellent blood flow in the right lower extremity. 01/19/12 on evaluation today patient's wound appears to be doing very well. In fact she does not appear to require debridement at this point, there's no evidence of infection, and overall from the standpoint of the wound she seems to be doing very well. With that being said I believe that it may be time to switch to  different dressing away from the [facility] Dressing she tells me she does have a lot going on her friend actually passed away yesterday and she's also having a lot of issues with her husband this obviously is weighing heavy on her as far as your thoughts and concerns today. 01/25/18 on evaluation today patient appears to be doing fairly well in regard to her right lateral malleolus. She has been tolerating the dressing changes without complication. Overall I feel like this is definitely showing signs of improvement as far as how the overall appearance of the wound is there's also evidence of epithelium start to  migrate over the granulation tissue. In general I think that she is progressing nicely as far as the wound is concerned. The only concern she really has is whether or not we can switch to every other week visits in order to avoid having as many MAKEYA, HILGERT ([id number]) appointments as her daughters have a difficult time getting her to her appointments as well as the patient's husband to his he is not doing very well at this point. 02/22/18 on evaluation today patient's right lateral malleolus ulcer appears to be doing great. She has been tolerating the dressing changes without complication. Overall you making excellent progress at this time. Patient is having no significant discomfort. 03/15/18 on evaluation today patient appears to be doing much more poorly in regard to her right lateral ankle ulcer at this point. Unfortunately since have last seen her her husband has passed just a few days ago is obviously weighed heavily on her her daughter also had surgery well she is with her today as usual. There does not appear to be any evidence of infection she does seem to have significant contusion/deep tissue injury to the right lateral malleolus which was not noted previous when I saw her last. It's hard to tell of exactly when this injury occurred although during the time she was  spending the night in the hospital this may have been most likely. 03/22/18 on evaluation today patient appears to actually be doing very well in regard to her ulcer. She did unfortunately have a setback which was noted last week however the good news is we seem to be getting back on track and in fact the wound in the core did still have some necrotic tissue which will be addressed at this point today but in general I'm seeing signs that things are on the up and up. She is glad to hear this obviously she's been somewhat concerned that due to the how her wound digressed more recently. 03/29/18 on evaluation today patient appears to be doing fairly well in regard to her right lower extremity lateral malleolus ulcer. She unfortunately does have a new area of pressure injury over the inferior portion where the wound has opened up a little bit larger secondary to the pressure she seems to be getting. She does tell me sometimes when she sleeps at night that it actually hurts and does seem to be pushing on the area little bit more unfortunately. There does not appear to be any evidence of infection which is good news. She has been tolerating the dressing changes without complication. She also did have some bruising in the left second and third toes due to the fact that she may have bump this or injured it although she has neuropathy so she does not feel she did move recently that may have been where this came from. Nonetheless there does not appear to be any evidence of infection at this time. 04/12/18 on evaluation today patient's wound on the right lateral ankle actually appears to be doing a little bit better with a lot of necrotic docking tissue centrally loosening up in clearing away. However she does have the beginnings of a deep tissue injury on the left lateral malleolus likely due to the fact we've been trying offload the right as much as we have. I think she may benefit from an assistive soft device to  help with offloading and it looks like they're looking at one of the doughnut conditions that wraps around the lower leg to offload  which I think will definitely do a good job. With that being said I think we definitely need to address this issue on the left before it becomes a wound. Patient is not having significant pain. 04/19/18 on evaluation today patient appears to be doing excellent in regard to the progress she's made with her right lateral ankle ulcer. The left ankle region which did show evidence of a deep tissue injury seems to be resolving there's little fluid noted underneath and a blister there's nothing open at this point in time overall I feel like this is progressing nicely which is good news. She does not seem to be having significant discomfort at this point which is also good news. 04/25/18-She is here in follow up evaluation for bilateral lateral malleolar ulcers. The right lateral malleolus ulcer with pale subcutaneous tissue exposure, central area of ulcer with tendon/periosteum exposed. The left lateral malleolus ulcer now with central area of nonviable tissue, otherwise deep tissue injury. She is wearing compression wraps to the left lower extremity, she will place the right lower extremity compression wraps on when she gets home. She will be out of town over the weekend and return next week and follow-up appointment. She completed her doxycycline this morning 05/03/18 on evaluation today patient appears to be doing very well in regard to her right lateral ankle ulcer in general. At least she's showing some signs of improvement in this regard. Unfortunately she has some additional injury to the left lateral malleolus region which appears to be new likely even over the past several days. Again this determination is based on the overall appearance. With that being said the patient is obviously frustrated about this currently. 05/10/18-She is here in follow-up evaluation for  bilateral lateral malleolar ulcers. She states she has purchased offloading shoes/boots and they will arrive tomorrow. She was asked to bring them in the office at next week's appointment so her provider is aware of product being utilized. She continues to sleep on right or left side, she has been encouraged to sleep on her back. The right lateral malleolus ulcer is precariously close to peri-osteum; will order xray. The left lateral malleolus ulcer is improved. Will switch back to santyl; she will follow up next week. 05/17/18 on evaluation today patient actually appears to be doing very well in regard to her malleolus her ulcers compared to last time I saw them. She does not seem to have as much in the way of contusion at this point which is great news. With that being said she does continue to have discomfort and I do believe that she is still continuing to benefit from the offloading/pressure reducing boots that were recommended. I think this is the key to trying to get this to heal up completely. 05/24/18 on evaluation today patient actually appears to be doing worse at this point in time unfortunately compared to her last week's evaluation. She is having really no increased pain which is good news unfortunately she does have more maceration in your theme and noted surrounding the right lateral ankle the left lateral ankle is not really is erythematous I do not see signs of the overt cellulitis on that side. Unfortunately the wounds do not seem to have shown any signs of improvement since the last evaluation. She also has significant swelling especially on the right compared to previous some of this may be due to infection however also think that she may be served better while she has these wounds by compression wrapping versus  continuing to use the Juxta-Lite for the time being. Especially with the amount of drainage that she is experiencing at this point. No fevers, chills, nausea, or vomiting  noted at this time. 05/31/18 on evaluation today patient appears to actually be doing better in regard to her right lateral lower extremity ulcer specifically on the malleolus region. She has been tolerating the antibiotic without complication. With that being said she still continues to have issues but a little bit of redness although nothing like she what she was experiencing previous. She still continues to pressure to her ankle area she did get the problem on offloading boots unfortunately she will not wear them she states there too uncomfortable and she can't get in and out of the bed. Nonetheless at this point her wounds seem to be continually getting worse which is not what we want I'm getting somewhat concerned about her progress and how things are going to proceed if we do not intervene in some way shape or form. I therefore had a very lengthy conversation today about offloading yet again and even made a specific suggestion for switching her to a memory foam mattress and even gave the information for a specific one that they could look at getting if it was something that they were interested in considering. She does not want to be considered for a hospital bed air mattress although honestly insurance would not cover it that she does not have any wounds on her trunk. 06/14/18 on evaluation today both wounds over the bilateral lateral malleolus her ulcers appear to be doing better there's no evidence of pressure injury at this point. She did get the foam mattress for her bed and this does seem to have been extremely beneficial for her in my pinion. Her daughter states that she is having difficulty getting out of bed because of how soft it is. The patient also relates this to be. Nonetheless I do feel like she's actually doing better. Unfortunately right after and around the time she was getting the mattress she also sustained a fall when she got up to go pick up the phone and ended up injuring her  right elbow she has 18 sutures in place. We are not caring for this currently although home health is going to be taking the sutures out shortly. Nonetheless this may be something that we need to evaluate going forward. It depends on BETZABE, BEVANS ([id number]) how well it has or has not healed in the end. She also recently saw an orthopedic specialist for an injection in the right shoulder just before her fall unfortunately the fall seems to have worsened her pain. 06/21/18 on evaluation today patient appears to be doing about the same in regard to her lateral malleolus ulcers. Both appear to be just a little bit deeper but again we are clinging away the necrotic and dead tissue which I think is why this is progressing towards a deeper realm as opposed to improving from my measurement standpoint in that regard. Nonetheless she has been tolerating the dressing changes she absolutely hates the memory foam mattress topper that was obtained for her nonetheless I do believe this is still doing excellent as far as taking care of excess pressure in regard to the lateral malleolus regions. She in fact has no pressure injury that I see whereas in weeks past it was week by week I was constantly seeing new pressure injuries. Overall I think it has been very beneficial for her. 07/03/18; patient arrives  in my clinic today. She has deep punched out areas over her bilateral lateral malleoli. The area on the right has some more depth. We spent a lot of time today talking about pressure relief for these areas. This started when her daughter asked for a prescription for a memory foam mattress. I have never written a prescription for a mattress and I don't think insurances would pay for that on an ordinary bed. In any case he came up that she has foam boots that she refuses to wear. I would suggest going to these before any other offloading issues when she is in bed. They say she is meticulous about offloading this  the rest of the day 07/10/18- She is seen in follow-up evaluation for bilateral, lateral malleolus ulcers. There is no improvement in the ulcers. She has purchased and is sleeping on a memory foam mattress/overlay, she has been using the offloading boots nightly over the past week. She has a follow up appointment with vascular medicine at the end of October, in my opinion this follow up should be expedited given her deterioration and suboptimal TBI results. We will order plain film xray of the left ankle as deeper structures are palpable; would consider having MRI, regardless of xray report(s). The ulcers will be treated with iodoflex/iodosorb, she is unable to safely change the dressings daily with santyl. 07/19/18 on evaluation today patient appears to be doing in general visually well in regard to her bilateral lateral malleolus ulcers. She has been tolerating the dressing changes without complication which is good news. With that being said we did have an x-ray performed on 07/12/18 which revealed a slight loosen see in the lateral portion of the distal left fibula which may represent artifact but underline lytic destruction or osteomyelitis could not be excluded. MRI was recommended. With that being said we can see about getting the patient scheduled for an MRI to further evaluate this area. In fact we have that scheduled currently for August 20 19,019. 07/26/18 on evaluation today patient's wound on the right lateral ankle actually appears to be doing fairly well at this point in my pinion. She has made some good progress currently. With that being said unfortunately in regard to the left lateral ankle ulcer this seems to be a little bit more problematic at this time. In fact as I further evaluated the situation she actually had bone exposed which is the first time that's been the case in the bone appear to be necrotic. Currently I did review patient's note from Dr. Bunnie Domino office with [facility]. He stated that ABI was 1.26 on the right and 0.95 on the left with good waveforms. Her perfusion is stable not reduced from previous studies and her digital waveforms were pretty good particularly on the right. His conclusion upon review of the note was that there was not much she could do to improve her perfusion and he felt she was adequate for wound healing. His suggestion was that she continued to see [place] and consider a synthetic skin graft if there was no underlying infection. He plans to see her back in six months or as needed. 08/01/18 on evaluation today patient appears to be doing better in regard to her right lateral ankle ulcer. Her left lateral ankle ulcer is about the same she still has bone involvement in evidence of necrosis. There does not appear to be evidence of infection at this time On the right lateral lower extremity. I have started  her on the Augmentin she picked this up and started this yesterday. This is to get her through until she sees infectious disease which is scheduled for 08/12/18. 08/06/18 on evaluation today patient appears to be doing rather well considering my discussion with patient's daughter at the end of last week. The area which was marked where she had erythema seems to be improved and this is good news. With that being said overall the patient seems to be making good improvement when it comes to the overall appearance of the right lateral ankle ulcer although this has been slow she at least is coming around in this regard. Unfortunately in regard to the left lateral ankle ulcer this is osteomyelitis based on the pathology report as well is bone culture. Nonetheless we are still waiting CT scan. Unfortunately the MRI we originally ordered cannot be performed as the patient is a pacemaker which I had overlooked. Nonetheless we are working on the CT scan approval and scheduling as of now. She did go to the hospital over the weekend and was placed on  IV Cefzo for a couple of days. Fortunately this seems to have improved the erythema quite significantly which is good news. There does not appear to be any evidence of worsening infection at this time. She did have some bleeding after the last debridement therefore I did not perform any sharp debridement in regard to left lateral ankle at this point. Patient has been approved for a snap vac for the right lateral ankle. 08/14/18; the patient with wounds over her bilateral lateral malleoli. The area on the right actually looks quite good. Been using a snap back on this area. Healthy granulation and appears to be filling in. Unfortunately the area on the left is really problematic. 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 Also noted to have cellulitis. She saw Dr. Novella Olive of infectious disease today and was put on linezolid. We are able to verify this with her pharmacy. She is completed the Augmentin that she was already on. We've been using Iodoflex to this area 08/23/18 on evaluation today patient's wounds both actually appear to be doing better compared to my prior evaluations. Fortunately she showing signs of good improvement in regard to the overall wound status especially where were using the snap vac on the right. In regard to left lateral malleolus the wound bed actually appears to be much cleaner than previously noted. I do not feel any phone directly probed during evaluation today and though there is tendon noted this does not appear to be necrotic it's actually fairly good as far as the overall appearance of the tendon is concerned. In general the wound bed actually appears to be doing significantly better than it was previous. Patient is currently in the care of [name] and I did review that note today. He actually has her on two weeks of linezolid and then following the patient will be on 1-2 months of Keflex.  That is the plan currently. She has been on antibiotics therapy as prescribed by myself initially starting on July 30, 2018 and has been on that continuously up to this point. 08/30/18 on evaluation today patient actually appears to be doing much better in regard to her right lateral malleolus ulcer. She has been tolerating the dressing changes specifically the snap vac without complication although she did have some issues with the seal currently. Apparently there was some trouble  with getting it to maintain over the past week past Sunday. Nonetheless overall the wound appears better in regard to the right lateral malleolus region. In regard to left lateral malleolus this actually show some signs of additional granulation although there still tendon noted in the base of the wound this appears to be healthy not necrotic in any way whatsoever. We are considering potentially using a snap vac for the left lateral malleolus as well the product wrap from [employer], [place], was present in the clinic today we're going to see this patient I did have her come in with me after obtaining consent from the patient and her daughter in order to look at the wound and see if there's any recommendation one way or another as to whether or not they felt the snapback could be beneficial for the left lateral malleolus region. But KAYLIEE, ATIENZA ([id number]) the conclusion was that it might be but that this is definitely a little bit deeper wound than what traditionally would be utilized for a snap vac. 09/06/18 on evaluation today patient actually appears to be doing excellent in my pinion in regard to both ankle ulcers. She has been tolerating the dressing changes without complication which is great news. Specifically we have been using the snap vac. In regard to the right ankle I'm not even sure that this is going to be necessary for today and following as the wound has filled in quite nicely. In regard to the left ankle I  do believe that we're seeing excellent epithelialization from the edge as well as granulation in the central portion the tendon is still exposed but there's no evidence of necrotic bone and in general I feel like the patient has made excellent progress even compared to last week with just one week of the snap vac. 09/11/18; this is a patient who has wounds on her bilateral lateral malleoli. Initially both of these were deep stage IV wounds in the setting of chronic arterial insufficiency. She has been revascularized. As I understand think she been using snap vacs to both of these wounds however the area on the right became more superficial and currently she is only using it on the left. Using silver collagen on the right and silver collagen under the back on the left I believe 09/19/18 on evaluation today patient actually appears to be doing very well in regard to her lateral malleolus or ulcers bilaterally. She has been tolerating the dressing changes without complication. Fortunately there does not appear to be any evidence of infection at this time. Overall I feel like she is improving in an excellent manner and I'm very pleased with the fact that everything seems to be turning towards the better for her. This has obviously been a long road. 09/27/18 on evaluation today patient actually appears to be doing very well in regard to her bilateral lateral malleolus ulcers. She has been tolerating the dressing changes without complication. Fortunately there does not appear to be any evidence of infection at this time which is also great news. No fevers, chills, nausea, or vomiting noted at this time. Overall I feel like she is doing excellent with the snap vac on the left malleolus. She had 40 mL of fluid collection over the past week. 10/04/18 on evaluation today patient actually appears to be doing well in regard to her bilateral lateral malleolus ulcers. She continues to tolerate the dressing changes  without complication. One issue that I see is the snap vac on the left  lateral malleolus which appears to have sealed off some fluid underlying this area and has not really allowed it to heal to the degree that I would like to see. For that reason I did suggest at this point we may want to pack a small piece of packing strip into this region to allow it to more effectively wick out fluid. 10/11/18 in general the patient today does not feel that she has been doing very well. She's been a little bit lethargic and subsequently is having bodyaches as well according to what she tells me today. With that being said overall she has been concerned with the fact that something may be worsening although to be honest her wounds really have not been appearing poorly. She does have a new ulcer on her left heel unfortunately. This may be pressure related. Nonetheless it seems to me to have potentially started at least as a blister I do not see any evidence of deep tissue injury. In regard to the left ankle the snap vac still seems to be causing the ceiling off of the deeper part of the wound which is in turn trapping fluid. I'm not extremely pleased with the overall appearance as far as progress from last week to this week therefore I'm gonna discontinue the snap vac at this point. 10/18/18 patient unfortunately this point has not been feeling well for the past several days. She was seen by Grayland Ormond her primary care provider who is a Librarian, academic at [facility]. Subsequently she states that she's been very weak and generally feeling malaise. No fevers, chills, nausea, or vomiting noted at this time. With that being said bloodwork was performed at the PCP office on the 11th of this month which showed a white blood cell count of 10.7. This was repeated today and shows a white blood cell count of 12.4. This does show signs of worsening. Coupled with the fact that she is feeling worse and that her left  ankle wound is not really showing signs of improvement I feel like this is an indication that the osteomyelitis is likely exacerbating not improving. Overall I think we may also want to check her C-reactive protein and sedimentation rate. Actually did call [facility] office this afternoon while the patient was in the office here with me. Subsequently based on the findings we discussed treatment possibilities and I think that it is appropriate for [place] to go ahead and initiate treatment with doxycycline which I'm going to do. Subsequently he did agree to see about adding a CRP and sedimentation rate to her orders. If that has not already been drawn to where they can run it they will contact the patient she can come back to have that check. They are in agreement with plan as far as the patient and her daughter are concerned. Nonetheless also think we need to get in touch with Dr. Henreitta Leber office to see about getting the patient scheduled with him as soon as possible. 11/08/18 on evaluation today patient presents for follow-up concerning her bilateral foot and ankle ulcers. I did do an extensive review of her chart in epic today. Subsequently she was seen by [name] he did initiate Cefepime IV antibiotic therapy. Subsequently she had some issues with her PICC line this had to be removed because it was coiled and then replaced. Fortunately that was now settled. Unfortunately she has continued have issues with her left heel as well as the issues that she is experiencing with her bilateral  lateral malleolus regions. I do believe however both areas seem to be doing a little bit better on evaluation today which is good news. No fevers, chills, nausea, or vomiting noted at this time. She actually has an angiogram schedule with Dr. dew on this coming Monday, November 11, 2018. Subsequently the patient states that she is feeling much better especially than what she was roughly 2 weeks ago. She actually had to  cancel an appointment because she was feeling so poorly. No fevers, chills, nausea, or vomiting noted at this time. 11/15/18 on evaluation today patient actually is status post having had her angiogram with Dr. dew Monday, four days ago. 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 He had to go and work on several areas of the vasculature fortunately he was able to obtain no more than a 30% residual stenosis throughout post procedure. I reviewed this note today. I think this will definitely help with healing at this time. Fortunately there does not appear to be any signs of infection and I do feel like ratio already has a better appearance to it. 11/22/18 upon evaluation today patient actually appears to be doing very well in regard to her wounds in general. The right lateral malleolus looks excellent the heel looks better in the left lateral malleolus also appears to be doing a little better. With that being said the right second toe actually appears to be open and training we been watching this is been dry and stable but now is open. 12/03/2018 Seen today for follow-up and management of multiple bilateral lower extremity wounds. New pressure injury of the great toe which is closed at this time. Wound of the right distal second toe appears larger today with deep undermining and a pocket of fluid present within the undermining region. Left and right malleolus is wounds are stable today with no signs and symptoms of infection.Denies any needs or concerns during exam today. 12/13/18 on evaluation today patient appears to be doing somewhat better in regard to her left heel ulcer. She also seems to be completely healed in regard to the right lateral malleolus ulcer. The left malleolus ulcer is smaller what unfortunately the wounds which are new over the first and second toes of the right foot are what are most concerning at this point especially the second. Both areas did require  sharp debridement today. 12/20/18 on evaluation today patient's wound actually appears to be doing better in regard to left lateral ankle and her right lateral ankle continues to remain healed. The hill ulcer on the left is improved. She does have improvement noted as well in regard to both toe ulcers. Overall I'm very pleased in this regard. No fevers, chills, nausea, or vomiting noted at this time. SEIRA, CODY ([id number]) 12/23/18 on evaluation today patient is seen after she had her toenails trimmed at the podiatrist office due to issues with her right great toe. There was what appeared to be dark eschar on the surface of the wound which had her in the podiatrist concerned. Nonetheless as I remember that during the last office visit I had utilize silver nitrate of this area I was much less concerned about the situation. Subsequently I was able to clean off much of this tissue without any complication today. This does not appear to show any signs of infection and actually look somewhat better compared to last time post debridement. Her second toe on the right foot actually had callous over  and there did appear still be some fluid underneath this that would require debridement today. 12/27/18 on evaluation today patient actually appears to be showing signs of improvement at all locations. Even the left lateral ankle although this is not quite as great as the other sites. Fortunately there does not appear to be any signs of infection at this time and both of her toes on the right foot seem to be showing signs of improvement which is good news and very pleased in this regard. 01/03/19 on evaluation today patient appears to be doing better for the most part in regard to her wounds in particular. There does not appear to be any evidence of infection at this time which is good news. Fortunately there is no sign of really worsening anywhere except for the right great toe which she does have what  appears to be a bruise/deep tissue injury which is very superficial and already resolving. I'm not sure where this came from I questioned her extensively and she does not recall what may have happened with this. Other than that the patient seems to be doing well even the left lateral ankle ulcer looks good and is getting smaller. 01/10/19 on evaluation today patient appears to be doing well in regard to her left heel wound and both of her toe wounds. Overall I feel like there is definitely improvement here and I'm happy in that regard. With that being said unfortunately she is having issues with the left lateral malleolus ulcer which unfortunately still has a lot of depth to it. This is gonna be a very difficult wound for [place] to be able to truly get to heal. I may want to consider some type of skin substitute to see if this would be of benefit for her. I'll discuss this with her more the next visit most likely. This was something I thought about more at the end of the visit when I was Artie out of the room and the patient had been discharged. 01/17/19 on evaluation today patient appears to be doing very well in regard to her wounds in general. She's been making excellent progress at this time. Fortunately there's no sign of infection at this time either. No fevers, chills, nausea, or vomiting noted at this time. The biggest issue is still her left lateral malleolus where it appears to be doing well and is getting smaller but still shows a small corner where this is deeper and goes down into what appears to be the joint space. Nonetheless this is taking much longer to heal although it still looks better in smaller than previous evaluations. 01/24/19 on evaluation today patient's wounds actually appear to be doing rather well in general overall. She did require some sharp debridement in regard to the right great toe but everything else appears to be doing excellent no debridement was even necessary. No  fevers, chills, nausea, or vomiting noted at this time. 01/31/19 on evaluation today patient actually appears to be doing much better in regard to her left foot wound on the heel as well as the ankle. The right great toe appears to be a little bit worse today this had callous over and trapped a lot of fluid underneath. Fortunately there's no signs of infection at any site which is great news. 02/07/19 on evaluation today patient actually appears to be doing decently well in regard to all of her ulcers at this point. No sharp debridement was required she is a little bit of hyper granulation in  regard to the left lateral ankle as well as the left heel but the hill itself is almost completely healed which is excellent news. Overall been very pleased in this regard. 02/14/19 on evaluation today patient actually appears to be doing very well in regard to her ulcers on the right first toe, left lateral malleolus, and left heel. In fact the heel is almost completely healed at this point. The patient does not show any signs of infection which is good news. Overall very pleased with how things have progressed. 04/18/19 Telehealth Evaluation During the COVID-19 National Emergency: Verbal Consent: Obtained from patient Allergies: reviewed and the active list is current. Medication changes: patient has no current medication changes. COVID-19 Screening: 1. Have you traveled internationally or on a cruise ship in the last 14 dayso No 2. Have you had contact with someone with or under investigation for COVID-19o No 3. Have you had a fever, cough, sore throat, or experiencing shortness of breatho No on evaluation today actually did have a visit with this patient through a telehealth encounter with her home health nurse. Subsequently it was noted that the patient actually appears to be doing okay in regard to her wounds both the right great toe as well as the left lateral malleolus have shown signs of improvement  although this in your theme around the left lateral malleolus there eschar coverings for both locations. The question is whether or not they are actually close and whether or not home health needs to discharge the patient or not. Nonetheless my concern is this point obviously is that without actually seeing her and being able to evaluate this directly I cannot ensure that she is completely healed which is the question that I'm being asked. 04/22/19 on evaluation today patient presents for her first evaluation since last time I saw her which was actually February 14, 2019. I did do a telehealth visit last week in which point it was questionable whether or not she may be healed and had to bring her in today for confirmation. With that being said she does seem to be doing quite well at this point which is good news. There does not appear to be any drainage in the deed I believe her wounds may be healed. Readmission: 09/04/2019 on evaluation today patient appears to be doing unfortunately somewhat more poorly in regard to her left foot ulcer secondary to a wound that began on 08/21/2019 at least when she first noticed this. Fortunately she has not had any evidence of active infection at this time. Systemically. I also do not necessarily see any evidence of infection at the blister/wound site on the first metatarsal head plantar aspect. This almost appears to be something that may have just rubbed inappropriately causing this to breakdown. They did not want a wait too long to come in to be seen as again she had significant issues in the past with wounds that took quite a while to heal in fact it was close to 2 years. Nonetheless this does not appear to be quite that bad but again we do need to remove some of the necrotic tissue from the surface of the wound to tell exactly the extent. She does not appear to have any significant arterial disease at this point and again her last ABIs and TBI's are recorded above  in the alert section her left ABI was 1.27 with a TBI of 0.72 to the right ABI 1.08 with a TBI of 0.39. Other than this the patient  has been doing quite GIDGET, QUIZHPI ([id number]) well since I last saw her and that was in May 2020. 09/11/2019 on evaluation today patient appeared to be doing very well with regard to her plantar foot ulcer on the left. In fact this appears to be almost completely healed which is awesome. That is after just 1 week of intervention. With that being said there is no signs of active infection at this time. 09/18/2019 on evaluation today patient actually appears to be doing excellent in fact she is completely healed based on what I am seeing at this point. Fortunately there is no signs of active infection at this time and overall patient is very pleased to hear that this area has healed so quickly. Readmission: 05/13/2021 upon evaluation today this patient presents for reevaluation here in the clinic. This is a wound that actually we previously took care of. She had 1 on the right ankle and the left the left turned out to be be harder due to to heal but nonetheless is doing great at this point as the right that has reopened and it was noted first just several weeks ago with a scab over it and came off in just the past few days. Fortunately there does not appear to be any obvious evidence of significant active infection at this time which is great news. No fevers, chills, nausea, vomiting, or diarrhea. 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 There does not appear to be any signs of this interfering in any way with her wound. She does have swelling we previously had compression socks for her ordered but again it does not look like she wears these on a regular basis by any means. 05/26/2021 upon evaluation today patient appears to be doing well with regard to her wound which is actually showing signs of  excellent improvement. There does not appear to be any signs of active infection which is great news and overall very pleased with where things stand today. No fevers, chills, nausea, vomiting, or diarrhea. 06/02/2021 upon evaluation today patient's wound actually showing signs of excellent improvement. Fortunately there does not appear to be any signs of active infection which is great news. I think the patient is making good progress with regard to her wounds in general. 06/09/2021 upon evaluation today patient appears to be doing excellent in regard to her wounds currently. Fortunately there does not appear to be any signs of active infection which is great news. No fevers, chills, nausea, vomiting, or diarrhea. Overall extremely pleased with where things stand today. I think the patient is making excellent progress. 06/16/2021 upon evaluation today patient appears to be doing well in regard to her wound. This is going require little bit of debridement today and that was discussed with the patient. Otherwise she seems to be doing quite well and I am actually very pleased with where things stand at this point. No fevers, chills, nausea, vomiting, or diarrhea. 06/23/2021 upon evaluation today patient appears to be doing well with regard to her wounds. She has been tolerating the dressing changes without complication. Fortunately there does not appear to be any evidence of infection and she has not had air in her home which she actually lives at an assisted living that got fixed this morning. With that being said because of that her wrap has been extremely hot and bothersome for her over the past week. 06/30/2021 upon evaluation today patient is actually making excellent  progress in regard to her ankle ulcer. She has been tolerating the dressing changes without complication and overall extremely pleased with where things stand there does not appear to be any evidence of active infection which is great  news. No fevers, chills, nausea, vomiting, or diarrhea. 07/07/21 upon evaluation today patients and culture on the right actually appears to be doing quite well. There does not appear to be any signs of infection and overall very pleased with where things stand today. No fevers, chills, nausea, or vomiting noted at this time. 07/14/2021 unfortunately the patient today has some evidence of deep tissue injury and pressure getting to the ankle region. Again I am not exactly sure what is going on here but this is very similar to issues that we have had in the past. I explained to the patient that she needs to be very mindful of exactly what is happening I think sleeping in bed is probably the main issue here although there could be other culprits I am not sure what else would potentially lead to this kind of a problem for her. 07/21/2021 upon evaluation today patient's wound actually showing signs of improvement compared to last week. Fortunately there does not appear to be any signs of active infection which is great news and overall very pleased with where things stand in that regard. With that being said I do believe that she is continuing to show signs of overall of getting better although I think this is still basically about what we were 2 weeks ago due to the worsening and now improvement. 07/28/2021 upon evaluation today patient appears to be doing well with regard to her wound. She does have some slough buildup on the surface of the wound which I would have to manage today. Fortunately there is no sign of active infection at this time. No fevers, chills, nausea, vomiting, or diarrhea. 08/04/2021 upon evaluation today patient appears to be doing about the same in regard to her wound. To be perfectly honest I am beginning to be a little bit concerned about the overall appearance of the wound bed. I do think possibly taking a sample right around the margin of the wound could be beneficial for her as far  as identifying anything such as an inflammatory process or to be honest even a skin tag cancer type process that may be of concern here. Fortunately there does not appear to be any evidence of active infection at this time which is great news she is not having any pain also great news. 08/11/2021 upon evaluation today patient appears to be doing well with regard to her wound. The good news is I did review her biopsy results and it showed some inflammatory mixed findings but nothing that appeared to be malignant which is great news. Overall this is more of a chronic venous stasis type issue which again is more what we have been treating. Nonetheless I just wanted to make sure before going forward that there was not anything more untoward going on at this point. 08/18/2021 upon evaluation today patient appears to be doing well with regard to her ankle ulcer. Fortunately there does not appear to be any signs of active infection at this time which is great overall wound is dramatically improved compared to last week. Since last week I have actually placed her on doxycycline and subsequently this is a good option as far as the findings are concerned at this point. 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 the doxycycline is doing that. There does not appear to be any evidence of active infection systemically which is great news. 08/25/2021 upon evaluation today patient appears to be doing well with regard to her wound. I feel like we are finally get back on track as far as healing is concerned I am much happier with the overall appearance today. I do think that she is tolerating the dressing changes without complication which is great news. We have been using Hydrofera Blue which I think is a good option. The good news is she is also doing great in [place], SALVATORE POE.  ([id number]) regard to her compression sock on the left which is a zipper compression that seems to be doing a great job keeping her edema under good control. 09/01/2021 upon evaluation today patient appears to be doing well with regard to her wound. Infection seems to be under much better control which is great news and very pleased in that regard. Fortunately there does not appear to be any signs of infection currently. Objective Constitutional Well-nourished and well-hydrated in no acute distress. Vitals Time Taken: 10:29 AM, Height: 62 in, Weight: 150 lbs, BMI: 27.4, Temperature: 98.3 F, Pulse: 72 bpm, Respiratory Rate: 18 breaths/min, Blood Pressure: 150/76 mmHg. Respiratory normal breathing without difficulty. Psychiatric this patient is able to make decisions and demonstrates good insight into disease process. Alert and Oriented x 3. pleasant and cooperative. General Notes: Patient's wound bed showed signs of good granulation epithelization the wound appears to be somewhat dry with the Hydrofera Blue to it. I think for this reason we may want to go and switch to the collagen at this point in order to help with the moisture content of the wound bed as well as hopefully new granular tissue growth. Integumentary (Hair, Skin) Wound #7 status is Open. Original cause of wound was Gradually Appeared. The date acquired was: 05/12/2021. The wound has been in treatment 15 weeks. The wound is located on the Right,Lateral Malleolus. The wound measures 1.3cm length x 1.1cm width x 0.3cm depth; 1.123cm^2 area and 0.337cm^3 volume. There is Fat Layer (Subcutaneous Tissue) exposed. There is no tunneling or undermining noted. There is a medium amount of serosanguineous drainage noted. The wound margin is distinct with the outline attached to the wound base. There is small (1-33%) red, pink, pale granulation within the wound bed. There is a large (67-100%) amount of necrotic tissue within the wound  bed. Assessment Active Problems ICD-10 Pressure ulcer of right ankle, stage 3 Lymphedema, not elsewhere classified Venous insufficiency (chronic) (peripheral) Presence of cardiac pacemaker Long term (current) use of anticoagulants Procedures Wound #7 Pre-procedure diagnosis of Wound #7 is a Pressure Ulcer located on the Right,Lateral Malleolus . There was a Three Layer Compression Therapy Procedure by Carlene Coria, RN. Post procedure Diagnosis Wound #7: Same as Pre-Procedure Plan BRIENNE, LIGUORI ([id number]) Follow-up Appointments: Return Appointment in 1 week. Bathing/ Shower/ Hygiene: May shower with wound dressing protected with water repellent cover or cast protector. Edema Control - Lymphedema / Segmental Compressive Device / Other: Optional: One layer of unna paste to top of compression wrap (to act as an anchor). - and at toes Elevate, Exercise Daily and Avoid Standing for Long Periods of Time. Elevate legs to the level of the heart and pump ankles as often as possible Elevate leg(s) parallel to the floor when sitting. WOUND #7: -  Malleolus Wound Laterality: Right, Lateral Cleanser: Soap and Water 1 x Per Week/30 Days Discharge Instructions: Gently cleanse wound with antibacterial soap, rinse and pat dry prior to dressing wounds Primary Dressing: Prisma 4.34 (in) 1 x Per Week/30 Days Discharge Instructions: Moisten w/normal saline or sterile water; Cover wound as directed. Do not remove from wound bed. Secondary Dressing: ABD Pad 5x9 (in/in) 1 x Per Week/30 Days Discharge Instructions: Cover with ABD pad Compression Wrap: Profore Lite LF 3 Multilayer Compression Bandaging System 1 x Per Week/30 Days Discharge Instructions: Apply 3 multi-layer wrap as prescribed. 1. Would recommend currently that we go ahead and continue with the wound care measures as before with regard to the compression wrapping. I do think she is doing well in this regard. Again we have been utilizing  a 3 layer compression wrap. 2. I am also can recommend that we go ahead and continue with the dressings although we will switch to a silver collagen dressing I think this will be ideal. It will help apply some moisture which I think will also be beneficial for the patient. We will see patient back for reevaluation in 1 week here in the clinic. If anything worsens or changes patient will contact our office for additional recommendations. Electronic Signature(s) Signed: 09/01/2021 10:51:36 AM By: Worthy Keeler PA-C Entered By: Worthy Keeler on 09/01/2021 10:51:36 Ruberg, [name] ([id number]) -------------------------------------------------------------------------------- SuperBill Details Patient Name: [name] Date of Service: 09/01/2021 Medical Record Number: [id number] Patient Account Number: [id number] Date of Birth/Sex: [birth year] (87 y.o. F) Treating RN: Carlene Coria Primary Care Provider: Adrian Prows Other Clinician: Referring Provider: Adrian Prows Treating Provider/Extender: Skipper Cliche in Treatment: 15 Diagnosis Coding ICD-10 Codes Code Description [id number] Pressure ulcer of right ankle, stage 3 I89.0 Lymphedema, not elsewhere classified I87.2 Venous insufficiency (chronic) (peripheral) Z95.0 Presence of cardiac pacemaker Z79.01 Long term (current) use of anticoagulants Physician Procedures CPT4 Code: [id number] Description: 99213 - WC PHYS LEVEL 3 - EST PT Modifier: Quantity: 1 CPT4 Code: Description: ICD-10 Diagnosis Description 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, stage 3 I89.0 Lymphedema, not elsewhere classified I87.2 Venous insufficiency (chronic) (peripheral) Z95.0 Presence of cardiac pacemaker Modifier: Quantity: Electronic Signature(s) Signed: 09/01/2021 10:52:00 AM By: Worthy Keeler PA-C Entered By: Worthy Keeler on 09/01/2021 10:51:59

## 2021-09-01 NOTE — Progress Notes (Addendum)
ARMA, REINING ([id number]) Visit Report for 09/01/2021 Arrival Information Details Patient Name: [name], [name] Date of Service: 09/01/2021 10:15 AM Medical Record Number: [id number] Patient Account Number: [id number] Date of Birth/Sex: [birth year] (87 y.o. F) Treating RN: Carlene Coria Primary Care Luceal Hollibaugh: Adrian Prows Other Clinician: Referring Williette Loewe: Adrian Prows Treating Veronika Heard/Extender: Skipper Cliche in Treatment: 15 Visit Information History Since Last Visit All ordered tests and consults were completed: No Patient Arrived: [name] Added or deleted any medications: No Arrival Time: 10:15 Any new allergies or adverse reactions: No Accompanied By: daughter Had a fall or experienced change in No Transfer Assistance: None activities of daily living that may affect Patient Identification Verified: Yes risk of falls: Secondary Verification Process Completed: Yes Signs or symptoms of abuse/neglect since last visito No Patient Requires Transmission-Based No Hospitalized since last visit: No Precautions: Implantable device outside of the clinic excluding No Patient Has Alerts: Yes cellular tissue based products placed in the center Patient Alerts: Patient on Blood since last visit: Thinner Has Dressing in Place as Prescribed: Yes NOT DIABETIC Has Compression in Place as Prescribed: Yes ***ELIQUIS*** Pain Present Now: No Electronic Signature(s) Signed: 09/01/2021 5:08:20 PM By: Carlene Coria RN Entered By: Carlene Coria on 09/01/2021 10:29:40 Schreier, [name] ([id number]) -------------------------------------------------------------------------------- Clinic Level of Care Assessment Details Patient Name: [name] Date of Service: 09/01/2021 10:15 AM Medical Record Number: [id number] Patient Account Number: [id number] Date of Birth/Sex: April 01, 1923 (87 y.o. F) Treating RN: Carlene Coria Primary Care Frona Yost: Adrian Prows Other  Clinician: Referring Leshon Armistead: Adrian Prows Treating Nolen Lindamood/Extender: Skipper Cliche in Treatment: 15 Clinic Level of Care Assessment Items TOOL 4 Quantity Score _0  - Use when only an EandM is performed on FOLLOW-UP visit 0 ASSESSMENTS - Nursing Assessment / Reassessment _1  - Reassessment of Co-morbidities (includes updates in patient status) 0 _2  - 0 Reassessment of Adherence to Treatment Plan ASSESSMENTS - Wound and Skin Assessment / Reassessment _3  - Simple Wound Assessment / Reassessment - one wound 0 _4  - 0 Complex Wound Assessment / Reassessment - multiple wounds _5  - 0 Dermatologic / Skin Assessment (not related to wound area) ASSESSMENTS - Focused Assessment _6  - Circumferential Edema Measurements - multi extremities 0 _7  - 0 Nutritional Assessment / Counseling / Intervention _8  - 0 Lower Extremity Assessment (monofilament, tuning fork, pulses) _9  - 0 Peripheral Arterial Disease Assessment (using hand held doppler) ASSESSMENTS - Ostomy and/or Continence Assessment and Care _10  - Incontinence Assessment and Management 0 _11  - 0 Ostomy Care Assessment and Management (repouching, etc.) PROCESS - Coordination of Care _12  - Simple Patient / Family Education for ongoing care 0 _13  - 0 Complex (extensive) Patient / Family Education for ongoing care _14  - 0 Staff obtains Programmer, systems, Records, Test Results / Process Orders _15  - 0 Staff telephones HHA, Nursing Homes / Clarify orders / etc _16  - 0 Routine Transfer to another Facility (non-emergent condition) _17  - 0 Routine Hospital Admission (non-emergent condition) _18  - 0 New Admissions / Biomedical engineer / Ordering NPWT, Apligraf, etc. _19  - 0 Emergency Hospital Admission (emergent condition) _20  - 0 Simple Discharge Coordination _21  - 0 Complex (extensive) Discharge Coordination PROCESS - Special Needs _22  - Pediatric / Minor Patient Management 0 _23  - 0 Isolation Patient Management _24  - 0 Hearing / Language  / Visual special needs _25  - 0 Assessment of Community assistance (transportation, D/C planning, etc.) _26  - 0 Additional assistance / Altered mentation _27  - 0 Support Surface(s) Assessment (bed, cushion, seat, etc.) INTERVENTIONS - Wound Cleansing /  Measurement Jimmye, Wisnieski [name] ([id number]) _0  - 0 Simple Wound Cleansing - one wound _1  - 0 Complex Wound Cleansing - multiple wounds _2  - 0 Wound Imaging (photographs - any number of wounds) _3  - 0 Wound Tracing (instead of photographs) _4  - 0 Simple Wound Measurement - one wound _5  - 0 Complex Wound Measurement - multiple wounds INTERVENTIONS - Wound Dressings _6  - Small Wound Dressing one or multiple wounds 0 _7  - 0 Medium Wound Dressing one or multiple wounds _8  - 0 Large Wound Dressing one or multiple wounds <HDQQIWLNLGXQJJHE>_1<\/DEYCXKGYJEHUDJSH>_7  - 0 Application of Medications - topical <WYOVZCHYIFOYDXAJ>_2<\/INOMVEHMCNOBSJGG>_83  - 0 Application of Medications - injection INTERVENTIONS - Miscellaneous _11  - External ear exam 0 _12  - 0 Specimen Collection (cultures, biopsies, blood, body fluids, etc.) _13  - 0 Specimen(s) / Culture(s) sent or taken to Lab for analysis _14  - 0 Patient Transfer (multiple staff / Civil Service fast streamer / Similar devices) _15  - 0 Simple Staple / Suture removal (25 or less) _16  - 0 Complex Staple / Suture removal (26 or more) _17  - 0 Hypo / Hyperglycemic Management (close monitor of Blood Glucose) _18  - 0 Ankle / Brachial Index (ABI) - do not check if billed separately _19  - 0 Vital Signs Has the patient been seen at the hospital within the last three years: Yes Total Score: 0 Level Of Care: ____ Electronic Signature(s) Signed: 09/02/2021 1:32:41 PM By: Carlene Coria RN Previous Signature: 09/01/2021 5:08:20 PM Version By: Carlene Coria RN Entered By: Carlene Coria on 09/02/2021 11:01:08 Kindt, [name] ([id number]) -------------------------------------------------------------------------------- Compression Therapy Details Patient Name: [name] Date of Service:  09/01/2021 10:15 AM Medical Record Number: [id number] Patient Account Number: [id number] Date of Birth/Sex: 03/11/1923 (87 y.o. F) Treating RN: Carlene Coria Primary Care Josette Shimabukuro: Adrian Prows Other Clinician: Referring Trygg Mantz: Adrian Prows Treating Frederick Marro/Extender: Skipper Cliche in Treatment: 15 Compression Therapy Performed for Wound Assessment: Wound #7 Right,Lateral Malleolus Performed By: Jake Church, RN Compression Type: Three Layer Post Procedure Diagnosis Same as Pre-procedure Electronic Signature(s) Signed: 09/01/2021 5:08:20 PM By: Carlene Coria RN Entered By: Carlene Coria on 09/01/2021 10:46:27 Hornaday, [name] ([id number]) -------------------------------------------------------------------------------- Encounter Discharge Information Details Patient Name: [name] Date of Service: 09/01/2021 10:15 AM Medical Record Number: [id number] Patient Account Number: [id number] Date of Birth/Sex: 01/30/1923 (87 y.o. F) Treating RN: Carlene Coria Primary Care Emylee Decelle: Adrian Prows Other Clinician: Referring Vanecia Limpert: Adrian Prows Treating Krystl Wickware/Extender: Skipper Cliche in Treatment: 15 Encounter Discharge Information Items Discharge Condition: Stable Ambulatory Status: Walker Discharge Destination: Home Transportation: Private Auto Accompanied By: self Schedule Follow-up Appointment: Yes Clinical Summary of Care: Patient Declined Electronic Signature(s) Signed: 09/02/2021 11:02:38 AM By: Carlene Coria RN Entered By: Carlene Coria on 09/02/2021 11:02:38 Wernert, [name] ([id number]) -------------------------------------------------------------------------------- Lower Extremity Assessment Details Patient Name: [name] Date of Service: 09/01/2021 10:15 AM Medical Record Number: [id number] Patient Account Number: [id number] Date of Birth/Sex: November 09, 1923 (87 y.o. F) Treating RN: Carlene Coria Primary Care Arlyss Weathersby:  Adrian Prows Other Clinician: Referring Lurleen Soltero: Adrian Prows Treating Vaibhav Fogleman/Extender: Skipper Cliche in Treatment: 15 Edema Assessment Assessed: [Left: No] Patrice Paradise: No] [Left: Edema] [Right: :] Calf Left: Right: Point of Measurement: 33 cm From Medial Instep 28 cm Ankle Left: Right: Point of Measurement: 10 cm From Medial Instep 17 cm Vascular Assessment Pulses: Dorsalis Pedis Palpable: [Right:Yes] Electronic Signature(s) Signed: 09/01/2021 5:08:20 PM By: Carlene Coria RN Entered By: Carlene Coria on 09/01/2021 10:33:36 Jasko, [name] ([id number]) -------------------------------------------------------------------------------- Multi Wound Chart Details Patient Name: [name] Date of Service:  09/01/2021 10:15 AM Medical Record Number: [id number] Patient Account Number: [id number] Date of Birth/Sex: [birth year] (87 y.o. F) Treating RN: Carlene Coria Primary Care Arasely Akkerman: Adrian Prows Other Clinician: Referring Santresa Levett: Adrian Prows Treating Marquiz Sotelo/Extender: Skipper Cliche in Treatment: 15 Vital Signs Height(in): 62 Pulse(bpm): 72 Weight(lbs): 150 Blood Pressure(mmHg): 150/76 Body Mass Index(BMI): 27 Temperature(F): 98.3 Respiratory Rate(breaths/min): 18 Photos: [N/A:N/A] Wound Location: Right, Lateral Malleolus N/A N/A Wounding Event: Gradually Appeared N/A N/A Primary Etiology: Pressure Ulcer N/A N/A Comorbid History: Cataracts, Congestive Heart Failure, N/A N/A Hypertension, Peripheral Arterial Disease, Osteoarthritis, Neuropathy Date Acquired: 05/12/2021 N/A N/A Weeks of Treatment: 15 N/A N/A Wound Status: Open N/A N/A Measurements L x W x D (cm) 1.3x1.1x0.3 N/A N/A Area (cm) : 1.123 N/A N/A Volume (cm) : 0.337 N/A N/A % Reduction in Area: 0.00% N/A N/A % Reduction in Volume: -49.80% N/A N/A Classification: Category/Stage III N/A N/A Exudate Amount: Medium N/A N/A Exudate Type: Serosanguineous N/A N/A Exudate Color:  red, brown N/A N/A Wound Margin: Distinct, outline attached N/A N/A Granulation Amount: Small (1-33%) N/A N/A Granulation Quality: Red, Pink, Pale N/A N/A Necrotic Amount: Large (67-100%) N/A N/A Exposed Structures: Fat Layer (Subcutaneous Tissue): N/A N/A Yes Fascia: No Tendon: No Muscle: No Joint: No Bone: No Epithelialization: Small (1-33%) N/A N/A Treatment Notes Electronic Signature(s) Signed: 09/01/2021 5:08:20 PM By: Carlene Coria RN Entered By: Carlene Coria on 09/01/2021 10:45:55 Rudin, [name] ([id number]) -------------------------------------------------------------------------------- Multi-Disciplinary Care Plan Details Patient Name: [name] Date of Service: 09/01/2021 10:15 AM Medical Record Number: [id number] Patient Account Number: [id number] Date of Birth/Sex: Apr 12, 1923 (87 y.o. F) Treating RN: Carlene Coria Primary Care Luz Mares: Adrian Prows Other Clinician: Referring Akash Winski: Adrian Prows Treating Deloras Reichard/Extender: Skipper Cliche in Treatment: 15 Active Inactive Wound/Skin Impairment Nursing Diagnoses: Knowledge deficit related to ulceration/compromised skin integrity Goals: Patient/caregiver will verbalize understanding of skin care regimen Date Initiated: 05/13/2021 Date Inactivated: 05/13/2021 Target Resolution Date: 06/12/2021 Goal Status: Met Ulcer/skin breakdown will have a volume reduction of 30% by week 4 Date Initiated: 05/13/2021 Date Inactivated: 06/16/2021 Target Resolution Date: 06/12/2021 Goal Status: Met Ulcer/skin breakdown will have a volume reduction of 50% by week 8 Date Initiated: 05/13/2021 Date Inactivated: 08/18/2021 Target Resolution Date: 07/13/2021 Goal Status: Unmet Unmet Reason: comorbities Ulcer/skin breakdown will have a volume reduction of 80% by week 12 Date Initiated: 05/13/2021 Date Inactivated: 08/18/2021 Target Resolution Date: 08/13/2021 Goal Status: Unmet Unmet Reason: comorbities Ulcer/skin  breakdown will heal within 14 weeks Date Initiated: 05/13/2021 Target Resolution Date: 09/12/2021 Goal Status: Active Interventions: Assess patient/caregiver ability to obtain necessary supplies Assess patient/caregiver ability to perform ulcer/skin care regimen upon admission and as needed Assess ulceration(s) every visit Notes: Electronic Signature(s) Signed: 09/01/2021 5:08:20 PM By: Carlene Coria RN Entered By: Carlene Coria on 09/01/2021 10:44:40 Knodel, [name] ([id number]) -------------------------------------------------------------------------------- Pain Assessment Details Patient Name: [name] Date of Service: 09/01/2021 10:15 AM Medical Record Number: [id number] Patient Account Number: [id number] Date of Birth/Sex: [birth year] (87 y.o. F) Treating RN: Carlene Coria Primary Care Swara Donze: Adrian Prows Other Clinician: Referring Nigel Ericsson: Adrian Prows Treating Cashlyn Huguley/Extender: Skipper Cliche in Treatment: 15 Active Problems Location of Pain Severity and Description of Pain Patient Has Paino No Site Locations Pain Management and Medication Current Pain Management: Electronic Signature(s) Signed: 09/01/2021 5:08:20 PM By: Carlene Coria RN Entered By: Carlene Coria on 09/01/2021 10:30:25 Rendall, [name] ([id number]) -------------------------------------------------------------------------------- Patient/Caregiver Education Details Patient Name: [name] Date of Service: 09/01/2021 10:15 AM Medical Record Number: [id number] Patient Account Number: [id number] Date of  Birth/Gender: July 22, 1923 (87 y.o. F) Treating RN: Carlene Coria Primary Care Physician: Adrian Prows Other Clinician: Referring Physician: Adrian Prows Treating Physician/Extender: Skipper Cliche in Treatment: 15 Education Assessment Education Provided To: Patient Education Topics Provided Wound/Skin Impairment: Methods: Explain/Verbal Responses: State  content correctly Electronic Signature(s) Signed: 09/02/2021 1:32:41 PM By: Carlene Coria RN Entered By: Carlene Coria on 09/02/2021 11:01:34 Ramakrishnan, [name] ([id number]) -------------------------------------------------------------------------------- Wound Assessment Details Patient Name: [name] Date of Service: 09/01/2021 10:15 AM Medical Record Number: [id number] Patient Account Number: [id number] Date of Birth/Sex: June 28, 1923 (87 y.o. F) Treating RN: Carlene Coria Primary Care Jawara Latorre: Adrian Prows Other Clinician: Referring Fareeha Evon: Adrian Prows Treating Katalyna Socarras/Extender: Skipper Cliche in Treatment: 15 Wound Status Wound Number: 7 Primary Pressure Ulcer Etiology: Wound Location: Right, Lateral Malleolus Wound Open Wounding Event: Gradually Appeared Status: Date Acquired: 05/12/2021 Comorbid Cataracts, Congestive Heart Failure, Hypertension, Weeks Of Treatment: 15 History: Peripheral Arterial Disease, Osteoarthritis, Neuropathy Clustered Wound: No Photos Wound Measurements Length: (cm) 1.3 Width: (cm) 1.1 Depth: (cm) 0.3 Area: (cm) 1.123 Volume: (cm) 0.337 % Reduction in Area: 0% % Reduction in Volume: -49.8% Epithelialization: Small (1-33%) Tunneling: No Undermining: No Wound Description Classification: Category/Stage III Wound Margin: Distinct, outline attached Exudate Amount: Medium Exudate Type: Serosanguineous Exudate Color: red, brown Foul Odor After Cleansing: No Slough/Fibrino Yes Wound Bed Granulation Amount: Small (1-33%) Exposed Structure Granulation Quality: Red, Pink, Pale Fascia Exposed: No Necrotic Amount: Large (67-100%) Fat Layer (Subcutaneous Tissue) Exposed: Yes Tendon Exposed: No Muscle Exposed: No Joint Exposed: No Bone Exposed: No Treatment Notes Wound #7 (Malleolus) Wound Laterality: Right, Lateral Cleanser Soap and Water Discharge Instruction: Gently cleanse wound with antibacterial soap, rinse and pat  dry prior to dressing wounds Peri-Wound Care Novell, [name] ([id number]) Topical Primary Dressing Prisma 4.34 (in) Discharge Instruction: Moisten w/normal saline or sterile water; Cover wound as directed. Do not remove from wound bed. Secondary Dressing ABD Pad 5x9 (in/in) Discharge Instruction: Cover with ABD pad Secured With Compression Wrap Profore Lite LF 3 Multilayer Compression Bandaging System Discharge Instruction: Apply 3 multi-layer wrap as prescribed. Compression Stockings Add-Ons Electronic Signature(s) Signed: 09/01/2021 5:08:20 PM By: Carlene Coria RN Entered By: Carlene Coria on 09/01/2021 10:32:15 Rodman, [name] ([id number]) -------------------------------------------------------------------------------- Vitals Details Patient Name: [name] Date of Service: 09/01/2021 10:15 AM Medical Record Number: [id number] Patient Account Number: [id number] Date of Birth/Sex: [birth year] (87 y.o. F) Treating RN: Carlene Coria Primary Care Clydie Dillen: Adrian Prows Other Clinician: Referring Yakira Duquette: Adrian Prows Treating Ules Marsala/Extender: Skipper Cliche in Treatment: 15 Vital Signs Time Taken: 10:29 Temperature (F): 98.3 Height (in): 62 Pulse (bpm): 72 Weight (lbs): 150 Respiratory Rate (breaths/min): 18 Body Mass Index (BMI): 27.4 Blood Pressure (mmHg): 150/76 Reference Range: 80 - 120 mg / dl Electronic Signature(s) Signed: 09/01/2021 5:08:20 PM By: Carlene Coria RN Entered By: Carlene Coria on 09/01/2021 10:30:05

## 2021-09-08 ENCOUNTER — Encounter: Payer: Medicare Other | Attending: Physician Assistant | Admitting: Physician Assistant

## 2021-09-08 ENCOUNTER — Other Ambulatory Visit: Payer: Self-pay

## 2021-09-08 DIAGNOSIS — M1990 Unspecified osteoarthritis, unspecified site: Secondary | ICD-10-CM | POA: Diagnosis not present

## 2021-09-08 DIAGNOSIS — L89513 Pressure ulcer of right ankle, stage 3: Secondary | ICD-10-CM | POA: Insufficient documentation

## 2021-09-08 DIAGNOSIS — G629 Polyneuropathy, unspecified: Secondary | ICD-10-CM | POA: Diagnosis not present

## 2021-09-08 DIAGNOSIS — Z950 Presence of cardiac pacemaker: Secondary | ICD-10-CM | POA: Insufficient documentation

## 2021-09-08 DIAGNOSIS — I872 Venous insufficiency (chronic) (peripheral): Secondary | ICD-10-CM | POA: Insufficient documentation

## 2021-09-08 DIAGNOSIS — Z7901 Long term (current) use of anticoagulants: Secondary | ICD-10-CM | POA: Diagnosis not present

## 2021-09-08 DIAGNOSIS — Z881 Allergy status to other antibiotic agents status: Secondary | ICD-10-CM | POA: Diagnosis not present

## 2021-09-08 DIAGNOSIS — I890 Lymphedema, not elsewhere classified: Secondary | ICD-10-CM | POA: Diagnosis not present

## 2021-09-08 NOTE — Progress Notes (Addendum)
KELIAH, HARNED ([id number]) Visit Report for 09/08/2021 Chief Complaint Document Details Patient Name: [name], [name]. Date of Service: 09/08/2021 10:15 AM Medical Record Number: [id number] Patient Account Number: [id number] Date of Birth/Sex: [birth year] (87 y.o. F) Treating RN: Carlene Coria Primary Care Provider: Adrian Prows Other Clinician: Referring Provider: Adrian Prows Treating Provider/Extender: Skipper Cliche in Treatment: 16 Information Obtained from: Patient Chief Complaint Right foot ulcer Electronic Signature(s) Signed: 09/08/2021 10:26:48 AM By: Worthy Keeler PA-C Entered By: Worthy Keeler on 09/08/2021 10:26:48 [name] ([id number]) -------------------------------------------------------------------------------- HPI Details Patient Name: [name] Date of Service: 09/08/2021 10:15 AM Medical Record Number: [id number] Patient Account Number: [id number] Date of Birth/Sex: 1923-02-27 (87 y.o. 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 Dr. Amalia Hailey the podiatrist saw her on 07/20/2017 and sent her to the wound center for possible hyperbaric oxygen therapy. past medical history of peripheral vascular disease, varicose veins, status post appendectomy, basal cell carcinoma excision from the left leg, cholecystectomy, pacemaker placement, right lower extremity angiography done by Dr. dew in March 2017 with placement of a stent. there is also note of a successful ablation of the right small saphenous vein done which was reviewed by ultrasound on 10/24/2016. the  patient had a right small saphenous vein ablation done on 10/20/2016. The patient has never been a smoker. She has been seen by Dr. Corene Cornea dew the vascular surgeon who most recently saw her on 06/15/2017 for evaluation of ongoing problems with right leg swelling. She had a lower extremity arterial duplex examination done(02/13/17) which showed patent distal right superficial femoral artery stent and above-the-knee popliteal stent without evidence of restenosis. The ABI was more than 1.3 on the right and more than 1.3 on the left. This was consistent with noncompressible arteries due to medial calcification. The right great toe pressure and PPG waveforms are within normal limits and the left great toe pressure and PPG waveforms are decreased. he recommended she continue to wear her compression stockings and continue with elevation. She is scheduled to have a noninvasive arterial study in the near future 08/16/2017 -- had a lower extremity arterial duplex examination done which showed patent distal right superficial femoral artery stent and above-the-knee popliteal stent without evidence of restenosis. The ABI was more than 1.3 on the right and more than 1.3 on the left. This was consistent with noncompressible arteries due to medial calcification. The right great toe pressure and PPG waveforms are within normal limits and the left great toe pressure and PPG waveforms are decreased. the x-ray of the right ankle has not yet been done 08/24/2017 -- had a right ankle x-ray -- IMPRESSION:1. No fracture, bone lesion or evidence of osteomyelitis. 2. Lateral soft tissue swelling with a soft tissue ulcer. she has not yet seen the vascular surgeon for review 08/31/17 on evaluation today patient's wound appears to be showing signs of improvement. She still with her appointment with vascular in order to review her results of her vascular study and then determine if any intervention would be recommended at that  time. No fevers, chills, nausea, or vomiting noted at this time. She has been tolerating the dressing changes without complication. 09/28/17 on evaluation today patient's  wound appears to show signs of good improvement in regard to the granulation tissue which is surfacing. There is still a layer of slough covering the wound and the posterior portion is still significantly deeper than the anterior nonetheless there has been some good sign of things moving towards the better. She is going to go back to Dr. dew for reevaluation to ensure her blood flow is still appropriate. That will be before her next evaluation with [place] next week. No fevers, chills, nausea, or vomiting noted at this time. Patient does have some discomfort rated to be a 3-4/10 depending on activity specifically cleansing the wound makes it worse. 10/05/2017 -- the patient was seen by Dr. Lucky Cowboy last week and noninvasive studies showed a normal right ABI with brisk triphasic waveforms consistent with no arterial insufficiency including normal digital pressures. The duplex showed a patent distal right SFA stent and the proximal SFA was also normal. He was pleased with her test and thought she should have enough of perfusion for normal wound healing. He would see her back in 6 months time. 12/21/17 on evaluation today patient appears to be doing fairly well in regard to her right lateral ankle wound. Unfortunately the main issue that she is expansion at this point is that she is having some issues with what appears to be some cellulitis in the right anterior shin. She has also been noting a little bit of uncomfortable feeling especially last night and her ankle area. I'm afraid that she made the developing a little bit of an infection. With that being said I think it is in the early stages. 12/28/17 on evaluation today patient's ankle appears to be doing excellent. She's making good progress at this point the cellulitis seems to have improved  after last week's evaluation. Overall she is having no significant discomfort which is excellent news. She does have an appointment with Dr. dew on March 29, 2018 for reevaluation in regard to the stent he placed. She seems to have excellent blood flow in the right lower extremity. 01/19/12 on evaluation today patient's wound appears to be doing very well. In fact she does not appear to require debridement at this point, there's no evidence of infection, and overall from the standpoint of the wound she seems to be doing very well. With that being said I believe that it may be time to switch to different dressing away from the [facility] Dressing she tells me she does have a lot going on her friend actually passed away yesterday and she's also having a lot of issues with her husband this obviously is weighing heavy on her as far as your thoughts and concerns today. 01/25/18 on evaluation today patient appears to be doing fairly well in regard to her right lateral malleolus. She has been tolerating the dressing changes without complication. Overall I feel like this is definitely showing signs of improvement as far as how the overall appearance of the wound is there's also evidence of epithelium start to migrate over the granulation tissue. In general I think that she is progressing nicely as far as the wound is concerned. The only concern she really has is whether or not we can switch to every other week visits in order to avoid having as many appointments as her daughters have a difficult time getting her to her appointments as well as the patient's husband to his he is not doing very well at this point. 02/22/18 on evaluation today patient's right lateral malleolus ulcer  appears to be doing great. She has been tolerating the dressing changes without complication. Overall you making excellent progress at this time. Patient is having no significant discomfort. MATTYE, VERDONE ([id number]) 03/15/18  on evaluation today patient appears to be doing much more poorly in regard to her right lateral ankle ulcer at this point. Unfortunately since have last seen her her husband has passed just a few days ago is obviously weighed heavily on her her daughter also had surgery well she is with her today as usual. There does not appear to be any evidence of infection she does seem to have significant contusion/deep tissue injury to the right lateral malleolus which was not noted previous when I saw her last. It's hard to tell of exactly when this injury occurred although during the time she was spending the night in the hospital this may have been most likely. 03/22/18 on evaluation today patient appears to actually be doing very well in regard to her ulcer. She did unfortunately have a setback which was noted last week however the good news is we seem to be getting back on track and in fact the wound in the core did still have some necrotic tissue which will be addressed at this point today but in general I'm seeing signs that things are on the up and up. She is glad to hear this obviously she's been somewhat concerned that due to the how her wound digressed more recently. 03/29/18 on evaluation today patient appears to be doing fairly well in regard to her right lower extremity lateral malleolus ulcer. She unfortunately does have a new area of pressure injury over the inferior portion where the wound has opened up a little bit larger secondary to the pressure she seems to be getting. She does tell me sometimes when she sleeps at night that it actually hurts and does seem to be pushing on the area little bit more unfortunately. There does not appear to be any evidence of infection which is good news. She has been tolerating the dressing changes without complication. She also did have some bruising in the left second and third toes due to the fact that she may have bump this or injured it although she has  neuropathy so she does not feel she did move recently that may have been where this came from. Nonetheless there does not appear to be any evidence of infection at this time. 04/12/18 on evaluation today patient's wound on the right lateral ankle actually appears to be doing a little bit better with a lot of necrotic docking tissue centrally loosening up in clearing away. However she does have the beginnings of a deep tissue injury on the left lateral malleolus likely due to the fact we've been trying offload the right as much as we have. I think she may benefit from an assistive soft device to help with offloading and it looks like they're looking at one of the doughnut conditions that wraps around the lower leg to offload which I think will definitely do a good job. With that being said I think we definitely need to address this issue on the left before it becomes a wound. Patient is not having significant pain. 04/19/18 on evaluation today patient appears to be doing excellent in regard to the progress she's made with her right lateral ankle ulcer. The left ankle region which did show evidence of a deep tissue injury seems to be resolving there's little fluid noted underneath and a blister there's  nothing open at this point in time overall I feel like this is progressing nicely which is good news. She does not seem to be having significant discomfort at this point which is also good news. 04/25/18-She is here in follow up evaluation for bilateral lateral malleolar ulcers. The right lateral malleolus ulcer with pale subcutaneous tissue exposure, central area of ulcer with tendon/periosteum exposed. The left lateral malleolus ulcer now with central area of nonviable tissue, otherwise deep tissue injury. She is wearing compression wraps to the left lower extremity, she will place the right lower extremity compression wraps on when she gets home. She will be out of town over the weekend and return next  week and follow-up appointment. She completed her doxycycline this morning 05/03/18 on evaluation today patient appears to be doing very well in regard to her right lateral ankle ulcer in general. At least she's showing some signs of improvement in this regard. Unfortunately she has some additional injury to the left lateral malleolus region which appears to be new likely even over the past several days. Again this determination is based on the overall appearance. With that being said the patient is obviously frustrated about this currently. 05/10/18-She is here in follow-up evaluation for bilateral lateral malleolar ulcers. She states she has purchased offloading shoes/boots and they will arrive tomorrow. She was asked to bring them in the office at next week's appointment so her provider is aware of product being utilized. She continues to sleep on right or left side, she has been encouraged to sleep on her back. The right lateral malleolus ulcer is precariously close to peri-osteum; will order xray. The left lateral malleolus ulcer is improved. Will switch back to santyl; she will follow up next week. 05/17/18 on evaluation today patient actually appears to be doing very well in regard to her malleolus her ulcers compared to last time I saw them. She does not seem to have as much in the way of contusion at this point which is great news. With that being said she does continue to have discomfort and I do believe that she is still continuing to benefit from the offloading/pressure reducing boots that were recommended. I think this is the key to trying to get this to heal up completely. 05/24/18 on evaluation today patient actually appears to be doing worse at this point in time unfortunately compared to her last week's evaluation. She is having really no increased pain which is good news unfortunately she does have more maceration in your theme and noted surrounding the right lateral ankle the left  lateral ankle is not really is erythematous I do not see signs of the overt cellulitis on that side. Unfortunately the wounds do not seem to have shown any signs of improvement since the last evaluation. She also has significant swelling especially on the right compared to previous some of this may be due to infection however also think that she may be served better while she has these wounds by compression wrapping versus continuing to use the Juxta-Lite for the time being. Especially with the amount of drainage that she is experiencing at this point. No fevers, chills, nausea, or vomiting noted at this time. 05/31/18 on evaluation today patient appears to actually be doing better in regard to her right lateral lower extremity ulcer specifically on the malleolus region. She has been tolerating the antibiotic without complication. With that being said she still continues to have issues but a little bit of redness although nothing like  she what she was experiencing previous. She still continues to pressure to her ankle area she did get the problem on offloading boots unfortunately she will not wear them she states there too uncomfortable and she can't get in and out of the bed. Nonetheless at this point her wounds seem to be continually getting worse which is not what we want I'm getting somewhat concerned about her progress and how things are going to proceed if we do not intervene in some way shape or form. I therefore had a very lengthy conversation today about offloading yet again and even made a specific suggestion for switching her to a memory foam mattress and even gave the information for a specific one that they could look at getting if it was something that they were interested in considering. She does not want to be considered for a hospital bed air mattress although honestly insurance would not cover it that she does not have any wounds on her trunk. 06/14/18 on evaluation today both wounds  over the bilateral lateral malleolus her ulcers appear to be doing better there's no evidence of pressure injury at this point. She did get the foam mattress for her bed and this does seem to have been extremely beneficial for her in my pinion. Her daughter states that she is having difficulty getting out of bed because of how soft it is. The patient also relates this to be. Nonetheless I do feel like she's actually doing better. Unfortunately right after and around the time she was getting the mattress she also sustained a fall when she got up to go pick up the phone and ended up injuring her right elbow she has [address] sut[address] place. We are not caring for this currently although home health is going to be taking the sutures out shortly. Nonetheless this may be something that we need to evaluate going forward. It depends on how well it has or has not healed in the end. She also recently saw an orthopedic specialist for an injection in the right shoulder just before her fall unfortunately the fall seems to have worsened her pain. 06/21/18 on evaluation today patient appears to be doing about the same in regard to her lateral malleolus ulcers. Both appear to be just a little bit deeper but again we are clinging away the necrotic and dead tissue which I think is why this is progressing towards a deeper realm as opposed MONCHEL, POLLITT. ([id number]) to improving from my measurement standpoint in that regard. Nonetheless she has been tolerating the dressing changes she absolutely hates the memory foam mattress topper that was obtained for her nonetheless I do believe this is still doing excellent as far as taking care of excess pressure in regard to the lateral malleolus regions. She in fact has no pressure injury that I see whereas in weeks past it was week by week I was constantly seeing new pressure injuries. Overall I think it has been very beneficial for her. 07/03/18; patient arrives in my clinic  today. She has deep punched out areas over her bilateral lateral malleoli. The area on the right has some more depth. We spent a lot of time today talking about pressure relief for these areas. This started when her daughter asked for a prescription for a memory foam mattress. I have never written a prescription for a mattress and I don't think insurances would pay for that on an ordinary bed. In any case he came up that she has foam  boots that she refuses to wear. I would suggest going to these before any other offloading issues when she is in bed. They say she is meticulous about offloading this the rest of the day 07/10/18- She is seen in follow-up evaluation for bilateral, lateral malleolus ulcers. There is no improvement in the ulcers. She has purchased and is sleeping on a memory foam mattress/overlay, she has been using the offloading boots nightly over the past week. She has a follow up appointment with vascular medicine at the end of October, in my opinion this follow up should be expedited given her deterioration and suboptimal TBI results. We will order plain film xray of the left ankle as deeper structures are palpable; would consider having MRI, regardless of xray report(s). The ulcers will be treated with iodoflex/iodosorb, she is unable to safely change the dressings daily with santyl. 07/19/18 on evaluation today patient appears to be doing in general visually well in regard to her bilateral lateral malleolus ulcers. She has been tolerating the dressing changes without complication which is good news. With that being said we did have an x-ray performed on 07/12/18 which revealed a slight loosen see in the lateral portion of the distal left fibula which may represent artifact but underline lytic destruction or osteomyelitis could not be excluded. MRI was recommended. With that being said we can see about getting the patient scheduled for an MRI to further evaluate this area. In fact we have  that scheduled currently for August 20 19,019. 07/26/18 on evaluation today patient's wound on the right lateral ankle actually appears to be doing fairly well at this point in my pinion. She has made some good progress currently. With that being said unfortunately in regard to the left lateral ankle ulcer this seems to be a little bit more problematic at this time. In fact as I further evaluated the situation she actually had bone exposed which is the first time that's been the case in the bone appear to be necrotic. Currently I did review patient's note from Dr. Bunnie Domino office with [facility]. He stated that ABI was 1.26 on the right and 0.95 on the left with good waveforms. Her perfusion is stable not reduced from previous studies and her digital waveforms were pretty good particularly on the right. His conclusion upon review of the note was that there was not much she could do to improve her perfusion and he felt she was adequate for wound healing. His suggestion was that she continued to see [place] and consider a synthetic skin graft if there was no underlying infection. He plans to see her back in six months or as needed. 08/01/18 on evaluation today patient appears to be doing better in regard to her right lateral ankle ulcer. Her left lateral ankle ulcer is about the same she still has bone involvement in evidence of necrosis. There does not appear to be evidence of infection at this time On the right lateral lower extremity. I have started her on the Augmentin she picked this up and started this yesterday. This is to get her through until she sees infectious disease which is scheduled for 08/12/18. 08/06/18 on evaluation today patient appears to be doing rather well considering my discussion with patient's daughter at the end of last week. The area which was marked where she had erythema seems to be improved and this is good news. With that being said overall the patient seems  to be making good improvement when it  comes to the overall appearance of the right lateral ankle ulcer although this has been slow she at least is coming around in this regard. Unfortunately in regard to the left lateral ankle ulcer this is osteomyelitis based on the pathology report as well is bone culture. Nonetheless we are still waiting CT scan. Unfortunately the MRI we originally ordered cannot be performed as the patient is a pacemaker which I had overlooked. Nonetheless we are working on the CT scan approval and scheduling as of now. She did go to the hospital over the weekend and was placed on IV Cefzo for a couple of days. Fortunately this seems to have improved the erythema quite significantly which is good news. There does not appear to be any evidence of worsening infection at this time. She did have some bleeding after the last debridement therefore I did not perform any sharp debridement in regard to left lateral ankle at this point. Patient has been approved for a snap vac for the right lateral ankle. 08/14/18; the patient with wounds over her bilateral lateral malleoli. The area on the right actually looks quite good. Been using a snap back on this area. Healthy granulation and appears to be filling in. Unfortunately the area on the left is really problematic. 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 Also noted to have cellulitis. She saw Dr. Novella Olive of infectious disease today and was put on linezolid. We are able to verify this with her pharmacy. She is completed the Augmentin that she was already on. We've been using Iodoflex to this area 08/23/18 on evaluation today patient's wounds both actually appear to be doing better compared to my prior evaluations. Fortunately she showing signs of good improvement in regard to the overall wound status especially where were using the snap vac on the right. In  regard to left lateral malleolus the wound bed actually appears to be much cleaner than previously noted. I do not feel any phone directly probed during evaluation today and though there is tendon noted this does not appear to be necrotic it's actually fairly good as far as the overall appearance of the tendon is concerned. In general the wound bed actually appears to be doing significantly better than it was previous. Patient is currently in the care of [name] and I did review that note today. He actually has her on two weeks of linezolid and then following the patient will be on 1-2 months of Keflex. That is the plan currently. She has been on antibiotics therapy as prescribed by myself initially starting on July 30, 2018 and has been on that continuously up to this point. 08/30/18 on evaluation today patient actually appears to be doing much better in regard to her right lateral malleolus ulcer. She has been tolerating the dressing changes specifically the snap vac without complication although she did have some issues with the seal currently. Apparently there was some trouble with getting it to maintain over the past week past Sunday. Nonetheless overall the wound appears better in regard to the right lateral malleolus region. In regard to left lateral malleolus this actually show some signs of additional granulation although there still tendon noted in the base of the wound this appears to be healthy not necrotic in any way whatsoever. We are considering potentially using a snap vac for the left lateral malleolus as well the product wrap from [employer], [place], was present in  the clinic today we're going to see this patient I did have her come in with me after obtaining consent from the patient and her daughter in order to look at the wound and see if there's any recommendation one way or another as to whether or not they felt the snapback could be beneficial for the left lateral malleolus region.  But the conclusion was that it might be but that this is definitely a little bit deeper wound than what traditionally would be utilized for a snap vac. 09/06/18 on evaluation today patient actually appears to be doing excellent in my pinion in regard to both ankle ulcers. She has been tolerating the dressing changes without complication which is great news. Specifically we have been using the snap vac. In regard to the right ankle I'm not even sure that this is going to be necessary for today and following as the wound has filled in quite nicely. In regard to the left ankle I do believe SEASON, ASTACIO ([id number]) that we're seeing excellent epithelialization from the edge as well as granulation in the central portion the tendon is still exposed but there's no evidence of necrotic bone and in general I feel like the patient has made excellent progress even compared to last week with just one week of the snap vac. 09/11/18; this is a patient who has wounds on her bilateral lateral malleoli. Initially both of these were deep stage IV wounds in the setting of chronic arterial insufficiency. She has been revascularized. As I understand think she been using snap vacs to both of these wounds however the area on the right became more superficial and currently she is only using it on the left. Using silver collagen on the right and silver collagen under the back on the left I believe 09/19/18 on evaluation today patient actually appears to be doing very well in regard to her lateral malleolus or ulcers bilaterally. She has been tolerating the dressing changes without complication. Fortunately there does not appear to be any evidence of infection at this time. Overall I feel like she is improving in an excellent manner and I'm very pleased with the fact that everything seems to be turning towards the better for her. This has obviously been a long road. 09/27/18 on evaluation today patient actually appears  to be doing very well in regard to her bilateral lateral malleolus ulcers. She has been tolerating the dressing changes without complication. Fortunately there does not appear to be any evidence of infection at this time which is also great news. No fevers, chills, nausea, or vomiting noted at this time. Overall I feel like she is doing excellent with the snap vac on the left malleolus. She had 40 mL of fluid collection over the past week. 10/04/18 on evaluation today patient actually appears to be doing well in regard to her bilateral lateral malleolus ulcers. She continues to tolerate the dressing changes without complication. One issue that I see is the snap vac on the left lateral malleolus which appears to have sealed off some fluid underlying this area and has not really allowed it to heal to the degree that I would like to see. For that reason I did suggest at this point we may want to pack a small piece of packing strip into this region to allow it to more effectively wick out fluid. 10/11/18 in general the patient today does not feel that she has been doing very well. She's been a little bit lethargic and  subsequently is having bodyaches as well according to what she tells me today. With that being said overall she has been concerned with the fact that something may be worsening although to be honest her wounds really have not been appearing poorly. She does have a new ulcer on her left heel unfortunately. This may be pressure related. Nonetheless it seems to me to have potentially started at least as a blister I do not see any evidence of deep tissue injury. In regard to the left ankle the snap vac still seems to be causing the ceiling off of the deeper part of the wound which is in turn trapping fluid. I'm not extremely pleased with the overall appearance as far as progress from last week to this week therefore I'm gonna discontinue the snap vac at this point. 10/18/18 patient unfortunately  this point has not been feeling well for the past several days. She was seen by Grayland Ormond her primary care provider who is a Librarian, academic at [facility]. Subsequently she states that she's been very weak and generally feeling malaise. No fevers, chills, nausea, or vomiting noted at this time. With that being said bloodwork was performed at the PCP office on the 11th of this month which showed a white blood cell count of 10.7. This was repeated today and shows a white blood cell count of 12.4. This does show signs of worsening. Coupled with the fact that she is feeling worse and that her left ankle wound is not really showing signs of improvement I feel like this is an indication that the osteomyelitis is likely exacerbating not improving. Overall I think we may also want to check her C-reactive protein and sedimentation rate. Actually did call [facility] office this afternoon while the patient was in the office here with me. Subsequently based on the findings we discussed treatment possibilities and I think that it is appropriate for [place] to go ahead and initiate treatment with doxycycline which I'm going to do. Subsequently he did agree to see about adding a CRP and sedimentation rate to her orders. If that has not already been drawn to where they can run it they will contact the patient she can come back to have that check. They are in agreement with plan as far as the patient and her daughter are concerned. Nonetheless also think we need to get in touch with Dr. Henreitta Leber office to see about getting the patient scheduled with him as soon as possible. 11/08/18 on evaluation today patient presents for follow-up concerning her bilateral foot and ankle ulcers. I did do an extensive review of her chart in epic today. Subsequently she was seen by [name] he did initiate Cefepime IV antibiotic therapy. Subsequently she had some issues with her PICC line this had to be removed because it  was coiled and then replaced. Fortunately that was now settled. Unfortunately she has continued have issues with her left heel as well as the issues that she is experiencing with her bilateral lateral malleolus regions. I do believe however both areas seem to be doing a little bit better on evaluation today which is good news. No fevers, chills, nausea, or vomiting noted at this time. She actually has an angiogram schedule with Dr. dew on this coming Monday, November 11, 2018. Subsequently the patient states that she is feeling much better especially than what she was roughly 2 weeks ago. She actually had to cancel an appointment because she was feeling so poorly. No fevers,  chills, nausea, or vomiting noted at this time. 11/15/18 on evaluation today patient actually is status post having had her angiogram with Dr. dew Monday, four days ago. 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 He had to go and work on several areas of the vasculature fortunately he was able to obtain no more than a 30% residual stenosis throughout post procedure. I reviewed this note today. I think this will definitely help with healing at this time. Fortunately there does not appear to be any signs of infection and I do feel like ratio already has a better appearance to it. 11/22/18 upon evaluation today patient actually appears to be doing very well in regard to her wounds in general. The right lateral malleolus looks excellent the heel looks better in the left lateral malleolus also appears to be doing a little better. With that being said the right second toe actually appears to be open and training we been watching this is been dry and stable but now is open. 12/03/2018 Seen today for follow-up and management of multiple bilateral lower extremity wounds. New pressure injury of the great toe which is closed at this time. Wound of the right distal second toe appears larger today with deep undermining  and a pocket of fluid present within the undermining region. Left and right malleolus is wounds are stable today with no signs and symptoms of infection.Denies any needs or concerns during exam today. 12/13/18 on evaluation today patient appears to be doing somewhat better in regard to her left heel ulcer. She also seems to be completely healed in regard to the right lateral malleolus ulcer. The left malleolus ulcer is smaller what unfortunately the wounds which are new over the first and second toes of the right foot are what are most concerning at this point especially the second. Both areas did require sharp debridement today. 12/20/18 on evaluation today patient's wound actually appears to be doing better in regard to left lateral ankle and her right lateral ankle continues to remain healed. The hill ulcer on the left is improved. She does have improvement noted as well in regard to both toe ulcers. Overall I'm very pleased in this regard. No fevers, chills, nausea, or vomiting noted at this time. 12/23/18 on evaluation today patient is seen after she had her toenails trimmed at the podiatrist office due to issues with her right great toe. There was what appeared to be dark eschar on the surface of the wound which had her in the podiatrist concerned. Nonetheless as I remember that during the last office visit I had utilize silver nitrate of this area I was much less concerned about the situation. Subsequently I was able to clean off much of this tissue without any complication today. This does not appear to show any signs of infection and actually look somewhat better [name], [name] ([id number]) compared to last time post debridement. Her second toe on the right foot actually had callous over and there did appear still be some fluid underneath this that would require debridement today. 12/27/18 on evaluation today patient actually appears to be showing signs of improvement at all locations. Even  the left lateral ankle although this is not quite as great as the other sites. Fortunately there does not appear to be any signs of infection at this time and both of her toes on the right foot seem to be showing signs of improvement which is good news and very pleased  in this regard. 01/03/19 on evaluation today patient appears to be doing better for the most part in regard to her wounds in particular. There does not appear to be any evidence of infection at this time which is good news. Fortunately there is no sign of really worsening anywhere except for the right great toe which she does have what appears to be a bruise/deep tissue injury which is very superficial and already resolving. I'm not sure where this came from I questioned her extensively and she does not recall what may have happened with this. Other than that the patient seems to be doing well even the left lateral ankle ulcer looks good and is getting smaller. 01/10/19 on evaluation today patient appears to be doing well in regard to her left heel wound and both of her toe wounds. Overall I feel like there is definitely improvement here and I'm happy in that regard. With that being said unfortunately she is having issues with the left lateral malleolus ulcer which unfortunately still has a lot of depth to it. This is gonna be a very difficult wound for [place] to be able to truly get to heal. I may want to consider some type of skin substitute to see if this would be of benefit for her. I'll discuss this with her more the next visit most likely. This was something I thought about more at the end of the visit when I was Artie out of the room and the patient had been discharged. 01/17/19 on evaluation today patient appears to be doing very well in regard to her wounds in general. She's been making excellent progress at this time. Fortunately there's no sign of infection at this time either. No fevers, chills, nausea, or vomiting noted at this  time. The biggest issue is still her left lateral malleolus where it appears to be doing well and is getting smaller but still shows a small corner where this is deeper and goes down into what appears to be the joint space. Nonetheless this is taking much longer to heal although it still looks better in smaller than previous evaluations. 01/24/19 on evaluation today patient's wounds actually appear to be doing rather well in general overall. She did require some sharp debridement in regard to the right great toe but everything else appears to be doing excellent no debridement was even necessary. No fevers, chills, nausea, or vomiting noted at this time. 01/31/19 on evaluation today patient actually appears to be doing much better in regard to her left foot wound on the heel as well as the ankle. The right great toe appears to be a little bit worse today this had callous over and trapped a lot of fluid underneath. Fortunately there's no signs of infection at any site which is great news. 02/07/19 on evaluation today patient actually appears to be doing decently well in regard to all of her ulcers at this point. No sharp debridement was required she is a little bit of hyper granulation in regard to the left lateral ankle as well as the left heel but the hill itself is almost completely healed which is excellent news. Overall been very pleased in this regard. 02/14/19 on evaluation today patient actually appears to be doing very well in regard to her ulcers on the right first toe, left lateral malleolus, and left heel. In fact the heel is almost completely healed at this point. The patient does not show any signs of infection which is good news. Overall very  pleased with how things have progressed. 04/18/19 Telehealth Evaluation During the COVID-19 National Emergency: Verbal Consent: Obtained from patient Allergies: reviewed and the active list is current. Medication changes: patient has no current  medication changes. COVID-19 Screening: 1. Have you traveled internationally or on a cruise ship in the last 14 dayso No 2. Have you had contact with someone with or under investigation for COVID-19o No 3. Have you had a fever, cough, sore throat, or experiencing shortness of breatho No on evaluation today actually did have a visit with this patient through a telehealth encounter with her home health nurse. Subsequently it was noted that the patient actually appears to be doing okay in regard to her wounds both the right great toe as well as the left lateral malleolus have shown signs of improvement although this in your theme around the left lateral malleolus there eschar coverings for both locations. The question is whether or not they are actually close and whether or not home health needs to discharge the patient or not. Nonetheless my concern is this point obviously is that without actually seeing her and being able to evaluate this directly I cannot ensure that she is completely healed which is the question that I'm being asked. 04/22/19 on evaluation today patient presents for her first evaluation since last time I saw her which was actually February 14, 2019. I did do a telehealth visit last week in which point it was questionable whether or not she may be healed and had to bring her in today for confirmation. With that being said she does seem to be doing quite well at this point which is good news. There does not appear to be any drainage in the deed I believe her wounds may be healed. Readmission: 09/04/2019 on evaluation today patient appears to be doing unfortunately somewhat more poorly in regard to her left foot ulcer secondary to a wound that began on 08/21/2019 at least when she first noticed this. Fortunately she has not had any evidence of active infection at this time. Systemically. I also do not necessarily see any evidence of infection at the blister/wound site on the first  metatarsal head plantar aspect. This almost appears to be something that may have just rubbed inappropriately causing this to breakdown. They did not want a wait too long to come in to be seen as again she had significant issues in the past with wounds that took quite a while to heal in fact it was close to 2 years. Nonetheless this does not appear to be quite that bad but again we do need to remove some of the necrotic tissue from the surface of the wound to tell exactly the extent. She does not appear to have any significant arterial disease at this point and again her last ABIs and TBI's are recorded above in the alert section her left ABI was 1.27 with a TBI of 0.72 to the right ABI 1.08 with a TBI of 0.39. Other than this the patient has been doing quite well since I last saw her and that was in May 2020. 09/11/2019 on evaluation today patient appeared to be doing very well with regard to her plantar foot ulcer on the left. In fact this appears to be almost completely healed which is awesome. That is after just 1 week of intervention. With that being said there is no signs of active infection at this time. AMMY, LIENHARD ([id number]) 09/18/2019 on evaluation today patient actually appears to be doing  excellent in fact she is completely healed based on what I am seeing at this point. Fortunately there is no signs of active infection at this time and overall patient is very pleased to hear that this area has healed so quickly. Readmission: 05/13/2021 upon evaluation today this patient presents for reevaluation here in the clinic. This is a wound that actually we previously took care of. She had 1 on the right ankle and the left the left turned out to be be harder due to to heal but nonetheless is doing great at this point as the right that has reopened and it was noted first just several weeks ago with a scab over it and came off in just the past few days. Fortunately there does not appear to  be any obvious evidence of significant active infection at this time which is great news. No fevers, chills, nausea, vomiting, or diarrhea. 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 There does not appear to be any signs of this interfering in any way with her wound. She does have swelling we previously had compression socks for her ordered but again it does not look like she wears these on a regular basis by any means. 05/26/2021 upon evaluation today patient appears to be doing well with regard to her wound which is actually showing signs of excellent improvement. There does not appear to be any signs of active infection which is great news and overall very pleased with where things stand today. No fevers, chills, nausea, vomiting, or diarrhea. 06/02/2021 upon evaluation today patient's wound actually showing signs of excellent improvement. Fortunately there does not appear to be any signs of active infection which is great news. I think the patient is making good progress with regard to her wounds in general. 06/09/2021 upon evaluation today patient appears to be doing excellent in regard to her wounds currently. Fortunately there does not appear to be any signs of active infection which is great news. No fevers, chills, nausea, vomiting, or diarrhea. Overall extremely pleased with where things stand today. I think the patient is making excellent progress. 06/16/2021 upon evaluation today patient appears to be doing well in regard to her wound. This is going require little bit of debridement today and that was discussed with the patient. Otherwise she seems to be doing quite well and I am actually very pleased with where things stand at this point. No fevers, chills, nausea, vomiting, or diarrhea. 06/23/2021 upon evaluation today patient appears to be doing well with regard to her wounds. She has been tolerating the dressing changes without complication.  Fortunately there does not appear to be any evidence of infection and she has not had air in her home which she actually lives at an assisted living that got fixed this morning. With that being said because of that her wrap has been extremely hot and bothersome for her over the past week. 06/30/2021 upon evaluation today patient is actually making excellent progress in regard to her ankle ulcer. She has been tolerating the dressing changes without complication and overall extremely pleased with where things stand there does not appear to be any evidence of active infection which is great news. No fevers, chills, nausea, vomiting, or diarrhea. 07/07/21 upon evaluation today patients and culture on the right actually appears to be doing quite well. There does not appear to be any signs of infection and overall very pleased with where things stand today. No fevers,  chills, nausea, or vomiting noted at this time. 07/14/2021 unfortunately the patient today has some evidence of deep tissue injury and pressure getting to the ankle region. Again I am not exactly sure what is going on here but this is very similar to issues that we have had in the past. I explained to the patient that she needs to be very mindful of exactly what is happening I think sleeping in bed is probably the main issue here although there could be other culprits I am not sure what else would potentially lead to this kind of a problem for her. 07/21/2021 upon evaluation today patient's wound actually showing signs of improvement compared to last week. Fortunately there does not appear to be any signs of active infection which is great news and overall very pleased with where things stand in that regard. With that being said I do believe that she is continuing to show signs of overall of getting better although I think this is still basically about what we were 2 weeks ago due to the worsening and now improvement. 07/28/2021 upon evaluation  today patient appears to be doing well with regard to her wound. She does have some slough buildup on the surface of the wound which I would have to manage today. Fortunately there is no sign of active infection at this time. No fevers, chills, nausea, vomiting, or diarrhea. 08/04/2021 upon evaluation today patient appears to be doing about the same in regard to her wound. To be perfectly honest I am beginning to be a little bit concerned about the overall appearance of the wound bed. I do think possibly taking a sample right around the margin of the wound could be beneficial for her as far as identifying anything such as an inflammatory process or to be honest even a skin tag cancer type process that may be of concern here. Fortunately there does not appear to be any evidence of active infection at this time which is great news she is not having any pain also great news. 08/11/2021 upon evaluation today patient appears to be doing well with regard to her wound. The good news is I did review her biopsy results and it showed some inflammatory mixed findings but nothing that appeared to be malignant which is great news. Overall this is more of a chronic venous stasis type issue which again is more what we have been treating. Nonetheless I just wanted to make sure before going forward that there was not anything more untoward going on at this point. 08/18/2021 upon evaluation today patient appears to be doing well with regard to her ankle ulcer. Fortunately there does not appear to be any signs of active infection at this time which is great overall wound is dramatically improved compared to last week. Since last week I have actually placed her on doxycycline and subsequently this is a good option as far as the findings are concerned at this point. I do believe that the positive result of MRSA is definitely something that needed to be addressed and the good news is The doxycycline is doing a good job of doing  this. the doxycycline is doing that. There does not appear to be any evidence of active infection systemically which is great news. 08/25/2021 upon evaluation today patient appears to be doing well with regard to her wound. I feel like we are finally get back on track as far as healing is concerned I am much happier with the overall appearance today. I  do think that she is tolerating the dressing changes without complication which is great news. We have been using Hydrofera Blue which I think is a good option. The good news is she is also doing great in regard to her compression sock on the left which is a zipper compression that seems to be doing a great job keeping her edema under good control. 09/01/2021 upon evaluation today patient appears to be doing well with regard to her wound. Infection seems to be under much better control which is great news and very pleased in that regard. Fortunately there does not appear to be any signs of infection currently. BILLIJO, DILLING ([id number]) 09/08/2021 upon evaluation today patient actually appears to be making good progress in regard to her wound. She has been tolerating the dressing changes without complication. Fortunately there does not appear to be any evidence of active infection at this time which is great news as well. No fevers, chills, nausea, vomiting, or diarrhea. Electronic Signature(s) Signed: 09/08/2021 10:50:03 AM By: Worthy Keeler PA-C Entered By: Worthy Keeler on 09/08/2021 10:50:02 Cashman, [name] ([id number]) -------------------------------------------------------------------------------- Physical Exam Details Patient Name: [name] Date of Service: 09/08/2021 10:15 AM Medical Record Number: [id number] Patient Account Number: [id number] Date of Birth/Sex: [birth year] (87 y.o. F) Treating RN: Carlene Coria Primary Care Provider: Adrian Prows Other Clinician: Referring Provider: Adrian Prows Treating  Provider/Extender: Skipper Cliche in Treatment: 52 Constitutional Well-nourished and well-hydrated in no acute distress. Respiratory normal breathing without difficulty. Psychiatric this patient is able to make decisions and demonstrates good insight into disease process. Alert and Oriented x 3. pleasant and cooperative. Notes Upon inspection patient's wound bed did show some slough buildup on the surface of the wound. With that being said I was able to gently clean this away just with saline gauze and a Q-tip I felt like it was fairly clean following I did not feel sharp debridement was necessary today. The patient is not having any pain and overall I am very pleased with the progress that stents been made. Electronic Signature(s) Signed: 09/08/2021 10:50:25 AM By: Worthy Keeler PA-C Entered By: Worthy Keeler on 09/08/2021 10:50:24 Katayama, [name] ([id number]) -------------------------------------------------------------------------------- Physician Orders Details Patient Name: [name] Date of Service: 09/08/2021 10:15 AM Medical Record Number: [id number] Patient Account Number: [id number] Date of Birth/Sex: [birth year] (87 y.o. F) Treating RN: Carlene Coria Primary Care Provider: Adrian Prows Other Clinician: Referring Provider: Adrian Prows Treating Provider/Extender: Skipper Cliche in Treatment: [phone] Verbal / Phone Orders: No Diagnosis Coding ICD-10 Coding Code Description 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, stage 3 I89.0 Lymphedema, not elsewhere classified I87.2 Venous insufficiency (chronic) (peripheral) Z95.0 Presence of cardiac pacemaker Z79.01 Long term (current) use of anticoagulants Follow-up Appointments o Return Appointment in 1 week. Bathing/ Shower/ Hygiene o May shower with wound dressing protected with water repellent cover or cast protector. Edema Control - Lymphedema / Segmental Compressive Device / Other o Optional: One  layer of unna paste to top of compression wrap (to act as an anchor). - and at toes o Elevate, Exercise Daily and Avoid Standing for Long Periods of Time. o Elevate legs to the level of the heart and pump ankles as often as possible o Elevate leg(s) parallel to the floor when sitting. Wound Treatment Wound #7 - Malleolus Wound Laterality: Right, Lateral Cleanser: Soap and Water 1 x Per Week/30 Days Discharge Instructions: Gently cleanse wound with antibacterial soap, rinse and pat dry prior to dressing wounds  Primary Dressing: Prisma 4.34 (in) 1 x Per Week/30 Days Discharge Instructions: Moisten w/normal saline or sterile water; Cover wound as directed. Do not remove from wound bed. Secondary Dressing: ABD Pad 5x9 (in/in) 1 x Per Week/30 Days Discharge Instructions: Cover with ABD pad Compression Wrap: Profore Lite LF 3 Multilayer Compression Bandaging System 1 x Per Week/30 Days Discharge Instructions: Apply 3 multi-layer wrap as prescribed. Electronic Signature(s) Signed: 09/09/2021 9:28:39 AM By: Worthy Keeler PA-C Signed: 09/09/2021 12:57:29 PM By: Carlene Coria RN Entered By: Carlene Coria on 09/08/2021 11:00:21 Milhouse, [name] ([id number]) -------------------------------------------------------------------------------- Problem List Details Patient Name: [name], [name] Date of Service: 09/08/2021 10:15 AM Medical Record Number: [id number] Patient Account Number: [id number] Date of Birth/Sex: 1923-11-02 (87 y.o. F) Treating RN: Carlene Coria Primary Care Provider: Adrian Prows Other Clinician: Referring Provider: Adrian Prows Treating Provider/Extender: Skipper Cliche in Treatment: 16 Active Problems ICD-10 Encounter Code Description Active Date MDM Diagnosis 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, stage 3 05/13/2021 No Yes I89.0 Lymphedema, not elsewhere classified 05/13/2021 No Yes I87.2 Venous insufficiency (chronic) (peripheral) 05/13/2021 No Yes Z95.0  Presence of cardiac pacemaker 05/13/2021 No Yes Z79.01 Long term (current) use of anticoagulants 05/13/2021 No Yes Inactive Problems Resolved Problems Electronic Signature(s) Signed: 09/08/2021 10:26:41 AM By: Worthy Keeler PA-C Entered By: Worthy Keeler on 09/08/2021 10:26:41 Antillon, [name] ([id number]) -------------------------------------------------------------------------------- Progress Note Details Patient Name: [name] Date of Service: 09/08/2021 10:15 AM Medical Record Number: [id number] Patient Account Number: [id number] Date of Birth/Sex: [birth year] (87 y.o. 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 Dr. Amalia Hailey the podiatrist saw her on 07/20/2017 and sent her to the wound center for possible hyperbaric oxygen therapy. past medical history of peripheral vascular disease, varicose veins, status post appendectomy, basal cell carcinoma excision from the left leg, cholecystectomy, pacemaker placement, right lower extremity angiography done by Dr. dew in March 2017 with placement of a stent. there is also note of a successful ablation of the right small saphenous vein done which was reviewed by ultrasound on 10/24/2016. the patient had a right small saphenous vein ablation done on 10/20/2016. The patient has never been a smoker. She has been seen by Dr. Corene Cornea dew the vascular surgeon who most recently saw her on 06/15/2017 for evaluation of ongoing problems with right leg swelling. She had a lower extremity arterial  duplex examination done(02/13/17) which showed patent distal right superficial femoral artery stent and above-the-knee popliteal stent without evidence of restenosis. The ABI was more than 1.3 on the right and more than 1.3 on the left. This was consistent with noncompressible arteries due to medial calcification. The right great toe pressure and PPG waveforms are within normal limits and the left great toe pressure and PPG waveforms are decreased. he recommended she continue to wear her compression stockings and continue with elevation. She is scheduled to have a noninvasive arterial study in the near future 08/16/2017 -- had a lower extremity arterial duplex examination done which showed patent distal right superficial femoral artery stent and above-the-knee popliteal stent without evidence of restenosis. The ABI was more than 1.3 on the right and more than 1.3  on the left. This was consistent with noncompressible arteries due to medial calcification. The right great toe pressure and PPG waveforms are within normal limits and the left great toe pressure and PPG waveforms are decreased. the x-ray of the right ankle has not yet been done 08/24/2017 -- had a right ankle x-ray -- IMPRESSION:1. No fracture, bone lesion or evidence of osteomyelitis. 2. Lateral soft tissue swelling with a soft tissue ulcer. she has not yet seen the vascular surgeon for review 08/31/17 on evaluation today patient's wound appears to be showing signs of improvement. She still with her appointment with vascular in order to review her results of her vascular study and then determine if any intervention would be recommended at that time. No fevers, chills, nausea, or vomiting noted at this time. She has been tolerating the dressing changes without complication. 09/28/17 on evaluation today patient's wound appears to show signs of good improvement in regard to the granulation tissue which is surfacing. There is still a layer of  slough covering the wound and the posterior portion is still significantly deeper than the anterior nonetheless there has been some good sign of things moving towards the better. She is going to go back to Dr. dew for reevaluation to ensure her blood flow is still appropriate. That will be before her next evaluation with [place] next week. No fevers, chills, nausea, or vomiting noted at this time. Patient does have some discomfort rated to be a 3-4/10 depending on activity specifically cleansing the wound makes it worse. 10/05/2017 -- the patient was seen by Dr. Lucky Cowboy last week and noninvasive studies showed a normal right ABI with brisk triphasic waveforms consistent with no arterial insufficiency including normal digital pressures. The duplex showed a patent distal right SFA stent and the proximal SFA was also normal. He was pleased with her test and thought she should have enough of perfusion for normal wound healing. He would see her back in 6 months time. 12/21/17 on evaluation today patient appears to be doing fairly well in regard to her right lateral ankle wound. Unfortunately the main issue that she is expansion at this point is that she is having some issues with what appears to be some cellulitis in the right anterior shin. She has also been noting a little bit of uncomfortable feeling especially last night and her ankle area. I'm afraid that she made the developing a little bit of an infection. With that being said I think it is in the early stages. 12/28/17 on evaluation today patient's ankle appears to be doing excellent. She's making good progress at this point the cellulitis seems to have improved after last week's evaluation. Overall she is having no significant discomfort which is excellent news. She does have an appointment with Dr. dew on March 29, 2018 for reevaluation in regard to the stent he placed. She seems to have excellent blood flow in the right lower extremity. 01/19/12 on  evaluation today patient's wound appears to be doing very well. In fact she does not appear to require debridement at this point, there's no evidence of infection, and overall from the standpoint of the wound she seems to be doing very well. With that being said I believe that it may be time to switch to different dressing away from the [name] Dressing she tells me she does have a lot going on her friend actually passed away yesterday and she's also having a lot of issues with her husband this obviously is weighing heavy  on her as far as your thoughts and concerns today. 01/25/18 on evaluation today patient appears to be doing fairly well in regard to her right lateral malleolus. She has been tolerating the dressing changes without complication. Overall I feel like this is definitely showing signs of improvement as far as how the overall appearance of the wound is there's also evidence of epithelium start to migrate over the granulation tissue. In general I think that she is progressing nicely as far as the wound is concerned. The only concern she really has is whether or not we can switch to every other week visits in order to avoid having as many ILISHA, BLUST ([id number]) appointments as her daughters have a difficult time getting her to her appointments as well as the patient's husband to his he is not doing very well at this point. 02/22/18 on evaluation today patient's right lateral malleolus ulcer appears to be doing great. She has been tolerating the dressing changes without complication. Overall you making excellent progress at this time. Patient is having no significant discomfort. 03/15/18 on evaluation today patient appears to be doing much more poorly in regard to her right lateral ankle ulcer at this point. Unfortunately since have last seen her her husband has passed just a few days ago is obviously weighed heavily on her her daughter also had surgery well she is with her  today as usual. There does not appear to be any evidence of infection she does seem to have significant contusion/deep tissue injury to the right lateral malleolus which was not noted previous when I saw her last. It's hard to tell of exactly when this injury occurred although during the time she was spending the night in the hospital this may have been most likely. 03/22/18 on evaluation today patient appears to actually be doing very well in regard to her ulcer. She did unfortunately have a setback which was noted last week however the good news is we seem to be getting back on track and in fact the wound in the core did still have some necrotic tissue which will be addressed at this point today but in general I'm seeing signs that things are on the up and up. She is glad to hear this obviously she's been somewhat concerned that due to the how her wound digressed more recently. 03/29/18 on evaluation today patient appears to be doing fairly well in regard to her right lower extremity lateral malleolus ulcer. She unfortunately does have a new area of pressure injury over the inferior portion where the wound has opened up a little bit larger secondary to the pressure she seems to be getting. She does tell me sometimes when she sleeps at night that it actually hurts and does seem to be pushing on the area little bit more unfortunately. There does not appear to be any evidence of infection which is good news. She has been tolerating the dressing changes without complication. She also did have some bruising in the left second and third toes due to the fact that she may have bump this or injured it although she has neuropathy so she does not feel she did move recently that may have been where this came from. Nonetheless there does not appear to be any evidence of infection at this time. 04/12/18 on evaluation today patient's wound on the right lateral ankle actually appears to be doing a little bit better  with a lot of necrotic docking tissue centrally loosening up in clearing away.  However she does have the beginnings of a deep tissue injury on the left lateral malleolus likely due to the fact we've been trying offload the right as much as we have. I think she may benefit from an assistive soft device to help with offloading and it looks like they're looking at one of the doughnut conditions that wraps around the lower leg to offload which I think will definitely do a good job. With that being said I think we definitely need to address this issue on the left before it becomes a wound. Patient is not having significant pain. 04/19/18 on evaluation today patient appears to be doing excellent in regard to the progress she's made with her right lateral ankle ulcer. The left ankle region which did show evidence of a deep tissue injury seems to be resolving there's little fluid noted underneath and a blister there's nothing open at this point in time overall I feel like this is progressing nicely which is good news. She does not seem to be having significant discomfort at this point which is also good news. 04/25/18-She is here in follow up evaluation for bilateral lateral malleolar ulcers. The right lateral malleolus ulcer with pale subcutaneous tissue exposure, central area of ulcer with tendon/periosteum exposed. The left lateral malleolus ulcer now with central area of nonviable tissue, otherwise deep tissue injury. She is wearing compression wraps to the left lower extremity, she will place the right lower extremity compression wraps on when she gets home. She will be out of town over the weekend and return next week and follow-up appointment. She completed her doxycycline this morning 05/03/18 on evaluation today patient appears to be doing very well in regard to her right lateral ankle ulcer in general. At least she's showing some signs of improvement in this regard. Unfortunately she has some  additional injury to the left lateral malleolus region which appears to be new likely even over the past several days. Again this determination is based on the overall appearance. With that being said the patient is obviously frustrated about this currently. 05/10/18-She is here in follow-up evaluation for bilateral lateral malleolar ulcers. She states she has purchased offloading shoes/boots and they will arrive tomorrow. She was asked to bring them in the office at next week's appointment so her provider is aware of product being utilized. She continues to sleep on right or left side, she has been encouraged to sleep on her back. The right lateral malleolus ulcer is precariously close to peri-osteum; will order xray. The left lateral malleolus ulcer is improved. Will switch back to santyl; she will follow up next week. 05/17/18 on evaluation today patient actually appears to be doing very well in regard to her malleolus her ulcers compared to last time I saw them. She does not seem to have as much in the way of contusion at this point which is great news. With that being said she does continue to have discomfort and I do believe that she is still continuing to benefit from the offloading/pressure reducing boots that were recommended. I think this is the key to trying to get this to heal up completely. 05/24/18 on evaluation today patient actually appears to be doing worse at this point in time unfortunately compared to her last week's evaluation. She is having really no increased pain which is good news unfortunately she does have more maceration in your theme and noted surrounding the right lateral ankle the left lateral ankle is not really is erythematous I  do not see signs of the overt cellulitis on that side. Unfortunately the wounds do not seem to have shown any signs of improvement since the last evaluation. She also has significant swelling especially on the right compared to previous some of  this may be due to infection however also think that she may be served better while she has these wounds by compression wrapping versus continuing to use the Juxta-Lite for the time being. Especially with the amount of drainage that she is experiencing at this point. No fevers, chills, nausea, or vomiting noted at this time. 05/31/18 on evaluation today patient appears to actually be doing better in regard to her right lateral lower extremity ulcer specifically on the malleolus region. She has been tolerating the antibiotic without complication. With that being said she still continues to have issues but a little bit of redness although nothing like she what she was experiencing previous. She still continues to pressure to her ankle area she did get the problem on offloading boots unfortunately she will not wear them she states there too uncomfortable and she can't get in and out of the bed. Nonetheless at this point her wounds seem to be continually getting worse which is not what we want I'm getting somewhat concerned about her progress and how things are going to proceed if we do not intervene in some way shape or form. I therefore had a very lengthy conversation today about offloading yet again and even made a specific suggestion for switching her to a memory foam mattress and even gave the information for a specific one that they could look at getting if it was something that they were interested in considering. She does not want to be considered for a hospital bed air mattress although honestly insurance would not cover it that she does not have any wounds on her trunk. 06/14/18 on evaluation today both wounds over the bilateral lateral malleolus her ulcers appear to be doing better there's no evidence of pressure injury at this point. She did get the foam mattress for her bed and this does seem to have been extremely beneficial for her in my pinion. Her daughter states that she is having  difficulty getting out of bed because of how soft it is. The patient also relates this to be. Nonetheless I do feel like she's actually doing better. Unfortunately right after and around the time she was getting the mattress she also sustained a fall when she got up to go pick up the phone and ended up injuring her right elbow she has 18 sutures in place. We are not caring for this currently although home health is going to be taking the sutures out shortly. Nonetheless this may be something that we need to evaluate going forward. It depends on HULDAH, MARIN ([id number]) how well it has or has not healed in the end. She also recently saw an orthopedic specialist for an injection in the right shoulder just before her fall unfortunately the fall seems to have worsened her pain. 06/21/18 on evaluation today patient appears to be doing about the same in regard to her lateral malleolus ulcers. Both appear to be just a little bit deeper but again we are clinging away the necrotic and dead tissue which I think is why this is progressing towards a deeper realm as opposed to improving from my measurement standpoint in that regard. Nonetheless she has been tolerating the dressing changes she absolutely hates the memory foam mattress topper that was  obtained for her nonetheless I do believe this is still doing excellent as far as taking care of excess pressure in regard to the lateral malleolus regions. She in fact has no pressure injury that I see whereas in weeks past it was week by week I was constantly seeing new pressure injuries. Overall I think it has been very beneficial for her. 07/03/18; patient arrives in my clinic today. She has deep punched out areas over her bilateral lateral malleoli. The area on the right has some more depth. We spent a lot of time today talking about pressure relief for these areas. This started when her daughter asked for a prescription for a memory foam mattress. I have  never written a prescription for a mattress and I don't think insurances would pay for that on an ordinary bed. In any case he came up that she has foam boots that she refuses to wear. I would suggest going to these before any other offloading issues when she is in bed. They say she is meticulous about offloading this the rest of the day 07/10/18- She is seen in follow-up evaluation for bilateral, lateral malleolus ulcers. There is no improvement in the ulcers. She has purchased and is sleeping on a memory foam mattress/overlay, she has been using the offloading boots nightly over the past week. She has a follow up appointment with vascular medicine at the end of October, in my opinion this follow up should be expedited given her deterioration and suboptimal TBI results. We will order plain film xray of the left ankle as deeper structures are palpable; would consider having MRI, regardless of xray report(s). The ulcers will be treated with iodoflex/iodosorb, she is unable to safely change the dressings daily with santyl. 07/19/18 on evaluation today patient appears to be doing in general visually well in regard to her bilateral lateral malleolus ulcers. She has been tolerating the dressing changes without complication which is good news. With that being said we did have an x-ray performed on 07/12/18 which revealed a slight loosen see in the lateral portion of the distal left fibula which may represent artifact but underline lytic destruction or osteomyelitis could not be excluded. MRI was recommended. With that being said we can see about getting the patient scheduled for an MRI to further evaluate this area. In fact we have that scheduled currently for August 20 19,019. 07/26/18 on evaluation today patient's wound on the right lateral ankle actually appears to be doing fairly well at this point in my pinion. She has made some good progress currently. With that being said unfortunately in regard to the left  lateral ankle ulcer this seems to be a little bit more problematic at this time. In fact as I further evaluated the situation she actually had bone exposed which is the first time that's been the case in the bone appear to be necrotic. Currently I did review patient's note from Dr. Bunnie Domino office with [facility]. He stated that ABI was 1.26 on the right and 0.95 on the left with good waveforms. Her perfusion is stable not reduced from previous studies and her digital waveforms were pretty good particularly on the right. His conclusion upon review of the note was that there was not much she could do to improve her perfusion and he felt she was adequate for wound healing. His suggestion was that she continued to see [place] and consider a synthetic skin graft if there was no underlying infection. He plans to  see her back in six months or as needed. 08/01/18 on evaluation today patient appears to be doing better in regard to her right lateral ankle ulcer. Her left lateral ankle ulcer is about the same she still has bone involvement in evidence of necrosis. There does not appear to be evidence of infection at this time On the right lateral lower extremity. I have started her on the Augmentin she picked this up and started this yesterday. This is to get her through until she sees infectious disease which is scheduled for 08/12/18. 08/06/18 on evaluation today patient appears to be doing rather well considering my discussion with patient's daughter at the end of last week. The area which was marked where she had erythema seems to be improved and this is good news. With that being said overall the patient seems to be making good improvement when it comes to the overall appearance of the right lateral ankle ulcer although this has been slow she at least is coming around in this regard. Unfortunately in regard to the left lateral ankle ulcer this is osteomyelitis based on the pathology report as  well is bone culture. Nonetheless we are still waiting CT scan. Unfortunately the MRI we originally ordered cannot be performed as the patient is a pacemaker which I had overlooked. Nonetheless we are working on the CT scan approval and scheduling as of now. She did go to the hospital over the weekend and was placed on IV Cefzo for a couple of days. Fortunately this seems to have improved the erythema quite significantly which is good news. There does not appear to be any evidence of worsening infection at this time. She did have some bleeding after the last debridement therefore I did not perform any sharp debridement in regard to left lateral ankle at this point. Patient has been approved for a snap vac for the right lateral ankle. 08/14/18; the patient with wounds over her bilateral lateral malleoli. The area on the right actually looks quite good. Been using a snap back on this area. Healthy granulation and appears to be filling in. Unfortunately the area on the left is really problematic. 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 Also noted to have cellulitis. She saw Dr. Novella Olive of infectious disease today and was put on linezolid. We are able to verify this with her pharmacy. She is completed the Augmentin that she was already on. We've been using Iodoflex to this area 08/23/18 on evaluation today patient's wounds both actually appear to be doing better compared to my prior evaluations. Fortunately she showing signs of good improvement in regard to the overall wound status especially where were using the snap vac on the right. In regard to left lateral malleolus the wound bed actually appears to be much cleaner than previously noted. I do not feel any phone directly probed during evaluation today and though there is tendon noted this does not appear to be necrotic it's actually fairly good as far as the overall  appearance of the tendon is concerned. In general the wound bed actually appears to be doing significantly better than it was previous. Patient is currently in the care of [name] and I did review that note today. He actually has her on two weeks of linezolid and then following the patient will be on 1-2 months of Keflex. That is the plan currently. She has been on antibiotics therapy as prescribed  by myself initially starting on July 30, 2018 and has been on that continuously up to this point. 08/30/18 on evaluation today patient actually appears to be doing much better in regard to her right lateral malleolus ulcer. She has been tolerating the dressing changes specifically the snap vac without complication although she did have some issues with the seal currently. Apparently there was some trouble with getting it to maintain over the past week past Sunday. Nonetheless overall the wound appears better in regard to the right lateral malleolus region. In regard to left lateral malleolus this actually show some signs of additional granulation although there still tendon noted in the base of the wound this appears to be healthy not necrotic in any way whatsoever. We are considering potentially using a snap vac for the left lateral malleolus as well the product wrap from [employer], [place], was present in the clinic today we're going to see this patient I did have her come in with me after obtaining consent from the patient and her daughter in order to look at the wound and see if there's any recommendation one way or another as to whether or not they felt the snapback could be beneficial for the left lateral malleolus region. But [name], [name] ([id number]) the conclusion was that it might be but that this is definitely a little bit deeper wound than what traditionally would be utilized for a snap vac. 09/06/18 on evaluation today patient actually appears to be doing excellent in my pinion in regard to  both ankle ulcers. She has been tolerating the dressing changes without complication which is great news. Specifically we have been using the snap vac. In regard to the right ankle I'm not even sure that this is going to be necessary for today and following as the wound has filled in quite nicely. In regard to the left ankle I do believe that we're seeing excellent epithelialization from the edge as well as granulation in the central portion the tendon is still exposed but there's no evidence of necrotic bone and in general I feel like the patient has made excellent progress even compared to last week with just one week of the snap vac. 09/11/18; this is a patient who has wounds on her bilateral lateral malleoli. Initially both of these were deep stage IV wounds in the setting of chronic arterial insufficiency. She has been revascularized. As I understand think she been using snap vacs to both of these wounds however the area on the right became more superficial and currently she is only using it on the left. Using silver collagen on the right and silver collagen under the back on the left I believe 09/19/18 on evaluation today patient actually appears to be doing very well in regard to her lateral malleolus or ulcers bilaterally. She has been tolerating the dressing changes without complication. Fortunately there does not appear to be any evidence of infection at this time. Overall I feel like she is improving in an excellent manner and I'm very pleased with the fact that everything seems to be turning towards the better for her. This has obviously been a long road. 09/27/18 on evaluation today patient actually appears to be doing very well in regard to her bilateral lateral malleolus ulcers. She has been tolerating the dressing changes without complication. Fortunately there does not appear to be any evidence of infection at this time which is also great news. No fevers, chills, nausea, or vomiting  noted at this time. Overall  I feel like she is doing excellent with the snap vac on the left malleolus. She had 40 mL of fluid collection over the past week. 10/04/18 on evaluation today patient actually appears to be doing well in regard to her bilateral lateral malleolus ulcers. She continues to tolerate the dressing changes without complication. One issue that I see is the snap vac on the left lateral malleolus which appears to have sealed off some fluid underlying this area and has not really allowed it to heal to the degree that I would like to see. For that reason I did suggest at this point we may want to pack a small piece of packing strip into this region to allow it to more effectively wick out fluid. 10/11/18 in general the patient today does not feel that she has been doing very well. She's been a little bit lethargic and subsequently is having bodyaches as well according to what she tells me today. With that being said overall she has been concerned with the fact that something may be worsening although to be honest her wounds really have not been appearing poorly. She does have a new ulcer on her left heel unfortunately. This may be pressure related. Nonetheless it seems to me to have potentially started at least as a blister I do not see any evidence of deep tissue injury. In regard to the left ankle the snap vac still seems to be causing the ceiling off of the deeper part of the wound which is in turn trapping fluid. I'm not extremely pleased with the overall appearance as far as progress from last week to this week therefore I'm gonna discontinue the snap vac at this point. 10/18/18 patient unfortunately this point has not been feeling well for the past several days. She was seen by Grayland Ormond her primary care provider who is a Librarian, academic at [facility]. Subsequently she states that she's been very weak and generally feeling malaise. No fevers, chills, nausea, or  vomiting noted at this time. With that being said bloodwork was performed at the PCP office on the 11th of this month which showed a white blood cell count of 10.7. This was repeated today and shows a white blood cell count of 12.4. This does show signs of worsening. Coupled with the fact that she is feeling worse and that her left ankle wound is not really showing signs of improvement I feel like this is an indication that the osteomyelitis is likely exacerbating not improving. Overall I think we may also want to check her C-reactive protein and sedimentation rate. Actually did call [facility] office this afternoon while the patient was in the office here with me. Subsequently based on the findings we discussed treatment possibilities and I think that it is appropriate for [place] to go ahead and initiate treatment with doxycycline which I'm going to do. Subsequently he did agree to see about adding a CRP and sedimentation rate to her orders. If that has not already been drawn to where they can run it they will contact the patient she can come back to have that check. They are in agreement with plan as far as the patient and her daughter are concerned. Nonetheless also think we need to get in touch with Dr. Henreitta Leber office to see about getting the patient scheduled with him as soon as possible. 11/08/18 on evaluation today patient presents for follow-up concerning her bilateral foot and ankle ulcers. I did do an extensive review of her  chart in epic today. Subsequently she was seen by [name] he did initiate Cefepime IV antibiotic therapy. Subsequently she had some issues with her PICC line this had to be removed because it was coiled and then replaced. Fortunately that was now settled. Unfortunately she has continued have issues with her left heel as well as the issues that she is experiencing with her bilateral lateral malleolus regions. I do believe however both areas seem to be doing a little bit  better on evaluation today which is good news. No fevers, chills, nausea, or vomiting noted at this time. She actually has an angiogram schedule with Dr. dew on this coming Monday, November 11, 2018. Subsequently the patient states that she is feeling much better especially than what she was roughly 2 weeks ago. She actually had to cancel an appointment because she was feeling so poorly. No fevers, chills, nausea, or vomiting noted at this time. 11/15/18 on evaluation today patient actually is status post having had her angiogram with Dr. dew Monday, four days ago. 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 He had to go and work on several areas of the vasculature fortunately he was able to obtain no more than a 30% residual stenosis throughout post procedure. I reviewed this note today. I think this will definitely help with healing at this time. Fortunately there does not appear to be any signs of infection and I do feel like ratio already has a better appearance to it. 11/22/18 upon evaluation today patient actually appears to be doing very well in regard to her wounds in general. The right lateral malleolus looks excellent the heel looks better in the left lateral malleolus also appears to be doing a little better. With that being said the right second toe actually appears to be open and training we been watching this is been dry and stable but now is open. 12/03/2018 Seen today for follow-up and management of multiple bilateral lower extremity wounds. New pressure injury of the great toe which is closed at this time. Wound of the right distal second toe appears larger today with deep undermining and a pocket of fluid present within the undermining region. Left and right malleolus is wounds are stable today with no signs and symptoms of infection.Denies any needs or concerns during exam today. 12/13/18 on evaluation today patient appears to be doing somewhat better in  regard to her left heel ulcer. She also seems to be completely healed in regard to the right lateral malleolus ulcer. The left malleolus ulcer is smaller what unfortunately the wounds which are new over the first and second toes of the right foot are what are most concerning at this point especially the second. Both areas did require sharp debridement today. 12/20/18 on evaluation today patient's wound actually appears to be doing better in regard to left lateral ankle and her right lateral ankle continues to remain healed. The hill ulcer on the left is improved. She does have improvement noted as well in regard to both toe ulcers. Overall I'm very pleased in this regard. No fevers, chills, nausea, or vomiting noted at this time. [name], [name] ([id number]) 12/23/18 on evaluation today patient is seen after she had her toenails trimmed at the podiatrist office due to issues with her right great toe. There was what appeared to be dark eschar on the surface of the wound which had her in the podiatrist concerned. Nonetheless as I remember that during the  last office visit I had utilize silver nitrate of this area I was much less concerned about the situation. Subsequently I was able to clean off much of this tissue without any complication today. This does not appear to show any signs of infection and actually look somewhat better compared to last time post debridement. Her second toe on the right foot actually had callous over and there did appear still be some fluid underneath this that would require debridement today. 12/27/18 on evaluation today patient actually appears to be showing signs of improvement at all locations. Even the left lateral ankle although this is not quite as great as the other sites. Fortunately there does not appear to be any signs of infection at this time and both of her toes on the right foot seem to be showing signs of improvement which is good news and very pleased in this  regard. 01/03/19 on evaluation today patient appears to be doing better for the most part in regard to her wounds in particular. There does not appear to be any evidence of infection at this time which is good news. Fortunately there is no sign of really worsening anywhere except for the right great toe which she does have what appears to be a bruise/deep tissue injury which is very superficial and already resolving. I'm not sure where this came from I questioned her extensively and she does not recall what may have happened with this. Other than that the patient seems to be doing well even the left lateral ankle ulcer looks good and is getting smaller. 01/10/19 on evaluation today patient appears to be doing well in regard to her left heel wound and both of her toe wounds. Overall I feel like there is definitely improvement here and I'm happy in that regard. With that being said unfortunately she is having issues with the left lateral malleolus ulcer which unfortunately still has a lot of depth to it. This is gonna be a very difficult wound for [place] to be able to truly get to heal. I may want to consider some type of skin substitute to see if this would be of benefit for her. I'll discuss this with her more the next visit most likely. This was something I thought about more at the end of the visit when I was Artie out of the room and the patient had been discharged. 01/17/19 on evaluation today patient appears to be doing very well in regard to her wounds in general. She's been making excellent progress at this time. Fortunately there's no sign of infection at this time either. No fevers, chills, nausea, or vomiting noted at this time. The biggest issue is still her left lateral malleolus where it appears to be doing well and is getting smaller but still shows a small corner where this is deeper and goes down into what appears to be the joint space. Nonetheless this is taking much longer to heal although it  still looks better in smaller than previous evaluations. 01/24/19 on evaluation today patient's wounds actually appear to be doing rather well in general overall. She did require some sharp debridement in regard to the right great toe but everything else appears to be doing excellent no debridement was even necessary. No fevers, chills, nausea, or vomiting noted at this time. 01/31/19 on evaluation today patient actually appears to be doing much better in regard to her left foot wound on the heel as well as the ankle. The right great toe appears to  be a little bit worse today this had callous over and trapped a lot of fluid underneath. Fortunately there's no signs of infection at any site which is great news. 02/07/19 on evaluation today patient actually appears to be doing decently well in regard to all of her ulcers at this point. No sharp debridement was required she is a little bit of hyper granulation in regard to the left lateral ankle as well as the left heel but the hill itself is almost completely healed which is excellent news. Overall been very pleased in this regard. 02/14/19 on evaluation today patient actually appears to be doing very well in regard to her ulcers on the right first toe, left lateral malleolus, and left heel. In fact the heel is almost completely healed at this point. The patient does not show any signs of infection which is good news. Overall very pleased with how things have progressed. 04/18/19 Telehealth Evaluation During the COVID-19 National Emergency: Verbal Consent: Obtained from patient Allergies: reviewed and the active list is current. Medication changes: patient has no current medication changes. COVID-19 Screening: 1. Have you traveled internationally or on a cruise ship in the last 14 dayso No 2. Have you had contact with someone with or under investigation for COVID-19o No 3. Have you had a fever, cough, sore throat, or experiencing shortness of breatho  No on evaluation today actually did have a visit with this patient through a telehealth encounter with her home health nurse. Subsequently it was noted that the patient actually appears to be doing okay in regard to her wounds both the right great toe as well as the left lateral malleolus have shown signs of improvement although this in your theme around the left lateral malleolus there eschar coverings for both locations. The question is whether or not they are actually close and whether or not home health needs to discharge the patient or not. Nonetheless my concern is this point obviously is that without actually seeing her and being able to evaluate this directly I cannot ensure that she is completely healed which is the question that I'm being asked. 04/22/19 on evaluation today patient presents for her first evaluation since last time I saw her which was actually February 14, 2019. I did do a telehealth visit last week in which point it was questionable whether or not she may be healed and had to bring her in today for confirmation. With that being said she does seem to be doing quite well at this point which is good news. There does not appear to be any drainage in the deed I believe her wounds may be healed. Readmission: 09/04/2019 on evaluation today patient appears to be doing unfortunately somewhat more poorly in regard to her left foot ulcer secondary to a wound that began on 08/21/2019 at least when she first noticed this. Fortunately she has not had any evidence of active infection at this time. Systemically. I also do not necessarily see any evidence of infection at the blister/wound site on the first metatarsal head plantar aspect. This almost appears to be something that may have just rubbed inappropriately causing this to breakdown. They did not want a wait too long to come in to be seen as again she had significant issues in the past with wounds that took quite a while to heal in fact it  was close to 2 years. Nonetheless this does not appear to be quite that bad but again we do need to remove some of  the necrotic tissue from the surface of the wound to tell exactly the extent. She does not appear to have any significant arterial disease at this point and again her last ABIs and TBI's are recorded above in the alert section her left ABI was 1.27 with a TBI of 0.72 to the right ABI 1.08 with a TBI of 0.39. Other than this the patient has been doing quite JACKQUELYN, SUNDBERG ([id number]) well since I last saw her and that was in May 2020. 09/11/2019 on evaluation today patient appeared to be doing very well with regard to her plantar foot ulcer on the left. In fact this appears to be almost completely healed which is awesome. That is after just 1 week of intervention. With that being said there is no signs of active infection at this time. 09/18/2019 on evaluation today patient actually appears to be doing excellent in fact she is completely healed based on what I am seeing at this point. Fortunately there is no signs of active infection at this time and overall patient is very pleased to hear that this area has healed so quickly. Readmission: 05/13/2021 upon evaluation today this patient presents for reevaluation here in the clinic. This is a wound that actually we previously took care of. She had 1 on the right ankle and the left the left turned out to be be harder due to to heal but nonetheless is doing great at this point as the right that has reopened and it was noted first just several weeks ago with a scab over it and came off in just the past few days. Fortunately there does not appear to be any obvious evidence of significant active infection at this time which is great news. No fevers, chills, nausea, vomiting, or diarrhea. 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 There does not appear to be any signs of this interfering in any  way with her wound. She does have swelling we previously had compression socks for her ordered but again it does not look like she wears these on a regular basis by any means. 05/26/2021 upon evaluation today patient appears to be doing well with regard to her wound which is actually showing signs of excellent improvement. There does not appear to be any signs of active infection which is great news and overall very pleased with where things stand today. No fevers, chills, nausea, vomiting, or diarrhea. 06/02/2021 upon evaluation today patient's wound actually showing signs of excellent improvement. Fortunately there does not appear to be any signs of active infection which is great news. I think the patient is making good progress with regard to her wounds in general. 06/09/2021 upon evaluation today patient appears to be doing excellent in regard to her wounds currently. Fortunately there does not appear to be any signs of active infection which is great news. No fevers, chills, nausea, vomiting, or diarrhea. Overall extremely pleased with where things stand today. I think the patient is making excellent progress. 06/16/2021 upon evaluation today patient appears to be doing well in regard to her wound. This is going require little bit of debridement today and that was discussed with the patient. Otherwise she seems to be doing quite well and I am actually very pleased with where things stand at this point. No fevers, chills, nausea, vomiting, or diarrhea. 06/23/2021 upon evaluation today patient appears to be doing well with regard to her wounds. She has been tolerating the dressing  changes without complication. Fortunately there does not appear to be any evidence of infection and she has not had air in her home which she actually lives at an assisted living that got fixed this morning. With that being said because of that her wrap has been extremely hot and bothersome for her over the past  week. 06/30/2021 upon evaluation today patient is actually making excellent progress in regard to her ankle ulcer. She has been tolerating the dressing changes without complication and overall extremely pleased with where things stand there does not appear to be any evidence of active infection which is great news. No fevers, chills, nausea, vomiting, or diarrhea. 07/07/21 upon evaluation today patients and culture on the right actually appears to be doing quite well. There does not appear to be any signs of infection and overall very pleased with where things stand today. No fevers, chills, nausea, or vomiting noted at this time. 07/14/2021 unfortunately the patient today has some evidence of deep tissue injury and pressure getting to the ankle region. Again I am not exactly sure what is going on here but this is very similar to issues that we have had in the past. I explained to the patient that she needs to be very mindful of exactly what is happening I think sleeping in bed is probably the main issue here although there could be other culprits I am not sure what else would potentially lead to this kind of a problem for her. 07/21/2021 upon evaluation today patient's wound actually showing signs of improvement compared to last week. Fortunately there does not appear to be any signs of active infection which is great news and overall very pleased with where things stand in that regard. With that being said I do believe that she is continuing to show signs of overall of getting better although I think this is still basically about what we were 2 weeks ago due to the worsening and now improvement. 07/28/2021 upon evaluation today patient appears to be doing well with regard to her wound. She does have some slough buildup on the surface of the wound which I would have to manage today. Fortunately there is no sign of active infection at this time. No fevers, chills, nausea, vomiting, or diarrhea. 08/04/2021  upon evaluation today patient appears to be doing about the same in regard to her wound. To be perfectly honest I am beginning to be a little bit concerned about the overall appearance of the wound bed. I do think possibly taking a sample right around the margin of the wound could be beneficial for her as far as identifying anything such as an inflammatory process or to be honest even a skin tag cancer type process that may be of concern here. Fortunately there does not appear to be any evidence of active infection at this time which is great news she is not having any pain also great news. 08/11/2021 upon evaluation today patient appears to be doing well with regard to her wound. The good news is I did review her biopsy results and it showed some inflammatory mixed findings but nothing that appeared to be malignant which is great news. Overall this is more of a chronic venous stasis type issue which again is more what we have been treating. Nonetheless I just wanted to make sure before going forward that there was not anything more untoward going on at this point. 08/18/2021 upon evaluation today patient appears to be doing well with regard to her  ankle ulcer. Fortunately there does not appear to be any signs of active infection at this time which is great overall wound is dramatically improved compared to last week. Since last week I have actually placed her on doxycycline and subsequently this is a good option as far as the findings are concerned at this point. 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 the doxycycline is doing that. There does not appear to be any evidence of active infection systemically which is great news. 08/25/2021 upon evaluation today patient appears to be doing well with regard to her wound. I feel like we are finally get back on track as far  as healing is concerned I am much happier with the overall appearance today. I do think that she is tolerating the dressing changes without complication which is great news. We have been using Hydrofera Blue which I think is a good option. The good news is she is also doing great in [name], [name]. ([id number]) regard to her compression sock on the left which is a zipper compression that seems to be doing a great job keeping her edema under good control. 09/01/2021 upon evaluation today patient appears to be doing well with regard to her wound. Infection seems to be under much better control which is great news and very pleased in that regard. Fortunately there does not appear to be any signs of infection currently. 09/08/2021 upon evaluation today patient actually appears to be making good progress in regard to her wound. She has been tolerating the dressing changes without complication. Fortunately there does not appear to be any evidence of active infection at this time which is great news as well. No fevers, chills, nausea, vomiting, or diarrhea. Objective Constitutional Well-nourished and well-hydrated in no acute distress. Vitals Time Taken: 10:19 AM, Height: 62 in, Weight: 150 lbs, BMI: 27.4, Temperature: 97.9 F, Pulse: 74 bpm, Respiratory Rate: 18 breaths/min, Blood Pressure: 151/68 mmHg. Respiratory normal breathing without difficulty. Psychiatric this patient is able to make decisions and demonstrates good insight into disease process. Alert and Oriented x 3. pleasant and cooperative. General Notes: Upon inspection patient's wound bed did show some slough buildup on the surface of the wound. With that being said I was able to gently clean this away just with saline gauze and a Q-tip I felt like it was fairly clean following I did not feel sharp debridement was necessary today. The patient is not having any pain and overall I am very pleased with the progress that stents been  made. Integumentary (Hair, Skin) Wound #7 status is Open. Original cause of wound was Gradually Appeared. The date acquired was: 05/12/2021. The wound has been in treatment 16 weeks. The wound is located on the Right,Lateral Malleolus. The wound measures 1cm length x 0.9cm width x 0.3cm depth; 0.707cm^2 area and 0.212cm^3 volume. There is Fat Layer (Subcutaneous Tissue) exposed. There is no tunneling or undermining noted. There is a medium amount of serosanguineous drainage noted. The wound margin is distinct with the outline attached to the wound base. There is small (1-33%) red, pink, pale granulation within the wound bed. There is a large (67-100%) amount of necrotic tissue within the wound bed. Assessment Active Problems ICD-10 Pressure ulcer of right ankle, stage 3 Lymphedema, not elsewhere classified Venous insufficiency (chronic) (peripheral) Presence of cardiac pacemaker Long term (current) use of anticoagulants  Plan 1. I am good recommend currently that we continue with the silver collagen I think that is probably can to be the best way to go. It seems to be helping with additional granular growth which is also good news. 2. I am also can recommend that we have the patient continue with the compression wrap. We have been doing a 4-layer compression wrap and this is doing a great job her wound is actually much smaller this week. We will see patient back for reevaluation in 1 week here in the clinic. If anything worsens or changes patient will contact our office for additional recommendations. This will be a nurse visit and I will see her in 2 weeks for a provider visit. [name], [name] ([id number]) Electronic Signature(s) Signed: 09/08/2021 10:50:55 AM By: Worthy Keeler PA-C Entered By: Worthy Keeler on 09/08/2021 10:50:54 Taber, [name] ([id number]) -------------------------------------------------------------------------------- SuperBill Details Patient Name: [name] Date of Service: 09/08/2021 Medical Record Number: [id number] Patient Account Number: [id number] Date of Birth/Sex: 11/24/1923 (87 y.o. F) Treating RN: Carlene Coria Primary Care Provider: Adrian Prows Other Clinician: Referring Provider: Adrian Prows Treating Provider/Extender: Skipper Cliche in Treatment: 16 Diagnosis Coding ICD-10 Codes Code Description [phone] Pressure ulcer of right ankle, stage 3 I89.0 Lymphedema, not elsewhere classified I87.2 Venous insufficiency (chronic) (peripheral) Z95.0 Presence of cardiac pacemaker Z79.01 Long term (current) use of anticoagulants Facility Procedures CPT4 Code: [id number] Description: (Facility Use Only) [phone] - [facility] RT LEG Modifier: Quantity: 1 Physician Procedures CPT4 Code: [id number] Description: 03212 - WC PHYS LEVEL 3 - EST PT Modifier: Quantity: 1 CPT4 Code: Description: ICD-10 Diagnosis Description 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, stage 3 I89.0 Lymphedema, not elsewhere classified I87.2 Venous insufficiency (chronic) (peripheral) Z95.0 Presence of cardiac pacemaker Modifier: Quantity: Electronic Signature(s) Signed: 09/09/2021 9:28:39 AM By: Worthy Keeler PA-C Signed: 09/09/2021 12:57:29 PM By: Carlene Coria RN Previous Signature: 09/08/2021 10:51:10 AM Version By: Worthy Keeler PA-C Entered By: Carlene Coria on 09/08/2021 11:01:11

## 2021-09-09 NOTE — Progress Notes (Signed)
[name], [name] ([id number]) Visit Report for 09/08/2021 Arrival Information Details Patient Name: [name], [name] Date of Service: 09/08/2021 10:15 AM Medical Record Number: [id number] Patient Account Number: [id number] Date of Birth/Sex: May 26, 1923 (87 y.o. F) Treating RN: Carlene Coria Primary Care Etha Stambaugh: Adrian Prows Other Clinician: Referring Heath Badon: Adrian Prows Treating Ramesha Poster/Extender: Skipper Cliche in Treatment: 27 Visit Information History Since Last Visit All ordered tests and consults were completed: No Patient Arrived: [name] Added or deleted any medications: No Arrival Time: 10:15 Any new allergies or adverse reactions: No Accompanied By: daughter Had a fall or experienced change in No Transfer Assistance: None activities of daily living that may affect Patient Identification Verified: Yes risk of falls: Secondary Verification Process Completed: Yes Signs or symptoms of abuse/neglect since last visito No Patient Requires Transmission-Based No Hospitalized since last visit: No Precautions: Implantable device outside of the clinic excluding No Patient Has Alerts: Yes cellular tissue based products placed in the center Patient Alerts: Patient on Blood since last visit: Thinner Has Dressing in Place as Prescribed: Yes NOT DIABETIC Has Compression in Place as Prescribed: Yes ***ELIQUIS*** Pain Present Now: No Electronic Signature(s) Signed: 09/09/2021 12:57:29 PM By: Carlene Coria RN Entered By: Carlene Coria on 09/08/2021 10:19:38 Odaniel, [name] ([id number]) -------------------------------------------------------------------------------- Clinic Level of Care Assessment Details Patient Name: [name] Date of Service: 09/08/2021 10:15 AM Medical Record Number: [id number] Patient Account Number: [id number] Date of Birth/Sex: 09/05/1923 (87 y.o. F) Treating RN: Carlene Coria Primary Care Daliana Leverett: Adrian Prows Other  Clinician: Referring Asmar Brozek: Adrian Prows Treating Yovan Leeman/Extender: Skipper Cliche in Treatment: 16 Clinic Level of Care Assessment Items TOOL 1 Quantity Score '[]'  - Use when EandM and Procedure is performed on INITIAL visit 0 ASSESSMENTS - Nursing Assessment / Reassessment '[]'  - General Physical Exam (combine w/ comprehensive assessment (listed just below) when performed on new 0 pt. evals) '[]'  - 0 Comprehensive Assessment (HX, ROS, Risk Assessments, Wounds Hx, etc.) ASSESSMENTS - Wound and Skin Assessment / Reassessment '[]'  - Dermatologic / Skin Assessment (not related to wound area) 0 ASSESSMENTS - Ostomy and/or Continence Assessment and Care '[]'  - Incontinence Assessment and Management 0 '[]'  - 0 Ostomy Care Assessment and Management (repouching, etc.) PROCESS - Coordination of Care '[]'  - Simple Patient / Family Education for ongoing care 0 '[]'  - 0 Complex (extensive) Patient / Family Education for ongoing care '[]'  - 0 Staff obtains Programmer, systems, Records, Test Results / Process Orders '[]'  - 0 Staff telephones HHA, Nursing Homes / Clarify orders / etc '[]'  - 0 Routine Transfer to another Facility (non-emergent condition) '[]'  - 0 Routine Hospital Admission (non-emergent condition) '[]'  - 0 New Admissions / Biomedical engineer / Ordering NPWT, Apligraf, etc. '[]'  - 0 Emergency Hospital Admission (emergent condition) PROCESS - Special Needs '[]'  - Pediatric / Minor Patient Management 0 '[]'  - 0 Isolation Patient Management '[]'  - 0 Hearing / Language / Visual special needs '[]'  - 0 Assessment of Community assistance (transportation, D/C planning, etc.) '[]'  - 0 Additional assistance / Altered mentation '[]'  - 0 Support Surface(s) Assessment (bed, cushion, seat, etc.) INTERVENTIONS - Miscellaneous '[]'  - External ear exam 0 '[]'  - 0 Patient Transfer (multiple staff / Civil Service fast streamer / Similar devices) '[]'  - 0 Simple Staple / Suture removal (25 or less) '[]'  - 0 Complex Staple / Suture removal  (26 or more) '[]'  - 0 Hypo/Hyperglycemic Management (do not check if billed separately) '[]'  - 0 Ankle / Brachial Index (ABI) - do not check if billed separately Has the  patient been seen at the hospital within the last three years: Yes Total Score: 0 Level Of Care: ____ [name] ([id number]) Electronic Signature(s) Signed: 09/09/2021 12:57:29 PM By: Carlene Coria RN Entered By: Carlene Coria on 09/08/2021 11:00:50 Hlavaty, [name] ([id number]) -------------------------------------------------------------------------------- Compression Therapy Details Patient Name: [name] Date of Service: 09/08/2021 10:15 AM Medical Record Number: [id number] Patient Account Number: [id number] Date of Birth/Sex: 05-Aug-1923 (87 y.o. F) Treating RN: Carlene Coria Primary Care Anzel Kearse: Adrian Prows Other Clinician: Referring Claudis Giovanelli: Adrian Prows Treating Malayah Demuro/Extender: Skipper Cliche in Treatment: 16 Compression Therapy Performed for Wound Assessment: Wound #7 Right,Lateral Malleolus Performed By: Jake Church, RN Compression Type: Three Layer Post Procedure Diagnosis Same as Pre-procedure Electronic Signature(s) Signed: 09/09/2021 12:57:29 PM By: Carlene Coria RN Entered By: Carlene Coria on 09/08/2021 11:00:41 Maricle, [name] ([id number]) -------------------------------------------------------------------------------- Encounter Discharge Information Details Patient Name: [name] Date of Service: 09/08/2021 10:15 AM Medical Record Number: [id number] Patient Account Number: [id number] Date of Birth/Sex: 07/19/1923 (87 y.o. F) Treating RN: Carlene Coria Primary Care Barrie Sigmund: Adrian Prows Other Clinician: Referring Landis Dowdy: Adrian Prows Treating Tahjae Clausing/Extender: Skipper Cliche in Treatment: 16 Encounter Discharge Information Items Discharge Condition: Stable Ambulatory Status: Walker Discharge Destination:  Home Transportation: Private Auto Accompanied By: daughter Schedule Follow-up Appointment: Yes Clinical Summary of Care: Patient Declined Electronic Signature(s) Signed: 09/09/2021 12:57:29 PM By: Carlene Coria RN Entered By: Carlene Coria on 09/08/2021 11:02:02 [name] ([id number]) -------------------------------------------------------------------------------- Lower Extremity Assessment Details Patient Name: [name] Date of Service: 09/08/2021 10:15 AM Medical Record Number: [id number] Patient Account Number: [id number] Date of Birth/Sex: 1923/08/21 (87 y.o. F) Treating RN: Carlene Coria Primary Care Jaymi Tinner: Adrian Prows Other Clinician: Referring James Lafalce: Adrian Prows Treating Chanler Schreiter/Extender: Skipper Cliche in Treatment: 16 Edema Assessment Assessed: Shirlyn Goltz: Yes] Patrice Paradise: No] Edema: [Left: Ye] [Right: s] Calf Left: Right: Point of Measurement: 33 cm From Medial Instep 28 cm Ankle Left: Right: Point of Measurement: 10 cm From Medial Instep 17 cm Vascular Assessment Pulses: Dorsalis Pedis Palpable: [Right:Yes] Electronic Signature(s) Signed: 09/09/2021 12:57:29 PM By: Carlene Coria RN Entered By: Carlene Coria on 09/08/2021 10:31:06 Wiltgen, [name] ([id number]) -------------------------------------------------------------------------------- Multi Wound Chart Details Patient Name: [name] Date of Service: 09/08/2021 10:15 AM Medical Record Number: [id number] Patient Account Number: [id number] Date of Birth/Sex: [birth year] (87 y.o. F) Treating RN: Carlene Coria Primary Care Pedro Oldenburg: Adrian Prows Other Clinician: Referring Keith Cancio: Adrian Prows Treating Grenda Lora/Extender: Skipper Cliche in Treatment: 16 Vital Signs Height(in): 62 Pulse(bpm): 87 Weight(lbs): 150 Blood Pressure(mmHg): 151/68 Body Mass Index(BMI): 27 Temperature(F): 97.9 Respiratory Rate(breaths/min): 18 Photos: [N/A:N/A] Wound Location:  Right, Lateral Malleolus N/A N/A Wounding Event: Gradually Appeared N/A N/A Primary Etiology: Pressure Ulcer N/A N/A Comorbid History: Cataracts, Congestive Heart Failure, N/A N/A Hypertension, Peripheral Arterial Disease, Osteoarthritis, Neuropathy Date Acquired: 05/12/2021 N/A N/A Weeks of Treatment: 16 N/A N/A Wound Status: Open N/A N/A Measurements L x W x D (cm) 1x0.9x0.3 N/A N/A Area (cm) : 0.707 N/A N/A Volume (cm) : 0.212 N/A N/A % Reduction in Area: 37.00% N/A N/A % Reduction in Volume: 5.80% N/A N/A Classification: Category/Stage III N/A N/A Exudate Amount: Medium N/A N/A Exudate Type: Serosanguineous N/A N/A Exudate Color: red, brown N/A N/A Wound Margin: Distinct, outline attached N/A N/A Granulation Amount: Small (1-33%) N/A N/A Granulation Quality: Red, Pink, Pale N/A N/A Necrotic Amount: Large (67-100%) N/A N/A Exposed Structures: Fat Layer (Subcutaneous Tissue): N/A N/A Yes Fascia: No Tendon: No Muscle: No Joint: No Bone: No Epithelialization: Small (1-33%)  N/A N/A Treatment Notes Electronic Signature(s) Signed: 09/09/2021 12:57:29 PM By: Carlene Coria RN Entered By: Carlene Coria on 09/08/2021 10:44:16 Kreutzer, [name] ([id number]) -------------------------------------------------------------------------------- Rosa Details Patient Name: [name] Date of Service: 09/08/2021 10:15 AM Medical Record Number: [id number] Patient Account Number: [id number] Date of Birth/Sex: 12-16-1922 (87 y.o. F) Treating RN: Carlene Coria Primary Care Stanislawa Gaffin: Adrian Prows Other Clinician: Referring Sayan Aldava: Adrian Prows Treating Christabelle Hanzlik/Extender: Skipper Cliche in Treatment: 16 Active Inactive Wound/Skin Impairment Nursing Diagnoses: Knowledge deficit related to ulceration/compromised skin integrity Goals: Patient/caregiver will verbalize understanding of skin care regimen Date Initiated: 05/13/2021 Date Inactivated:  05/13/2021 Target Resolution Date: 06/12/2021 Goal Status: Met Ulcer/skin breakdown will have a volume reduction of 30% by week 4 Date Initiated: 05/13/2021 Date Inactivated: 06/16/2021 Target Resolution Date: 06/12/2021 Goal Status: Met Ulcer/skin breakdown will have a volume reduction of 50% by week 8 Date Initiated: 05/13/2021 Date Inactivated: 08/18/2021 Target Resolution Date: 07/13/2021 Goal Status: Unmet Unmet Reason: comorbities Ulcer/skin breakdown will have a volume reduction of 80% by week 12 Date Initiated: 05/13/2021 Date Inactivated: 08/18/2021 Target Resolution Date: 08/13/2021 Goal Status: Unmet Unmet Reason: comorbities Ulcer/skin breakdown will heal within 14 weeks Date Initiated: 05/13/2021 Target Resolution Date: 09/12/2021 Goal Status: Active Interventions: Assess patient/caregiver ability to obtain necessary supplies Assess patient/caregiver ability to perform ulcer/skin care regimen upon admission and as needed Assess ulceration(s) every visit Notes: Electronic Signature(s) Signed: 09/09/2021 12:57:29 PM By: Carlene Coria RN Entered By: Carlene Coria on 09/08/2021 10:44:06 Beahm, [name] ([id number]) -------------------------------------------------------------------------------- Pain Assessment Details Patient Name: [name] Date of Service: 09/08/2021 10:15 AM Medical Record Number: [id number] Patient Account Number: [id number] Date of Birth/Sex: [birth year] (87 y.o. F) Treating RN: Carlene Coria Primary Care Barlow Harrison: Adrian Prows Other Clinician: Referring Emory Leaver: Adrian Prows Treating Claryce Friel/Extender: Skipper Cliche in Treatment: 16 Active Problems Location of Pain Severity and Description of Pain Patient Has Paino No Site Locations Pain Management and Medication Current Pain Management: Electronic Signature(s) Signed: 09/09/2021 12:57:29 PM By: Carlene Coria RN Entered By: Carlene Coria on 09/08/2021 10:20:09 [name] ([id number]) -------------------------------------------------------------------------------- Patient/Caregiver Education Details Patient Name: [name] Date of Service: 09/08/2021 10:15 AM Medical Record Number: [id number] Patient Account Number: [id number] Date of Birth/Gender: February 13, 1923 (87 y.o. F) Treating RN: Carlene Coria Primary Care Physician: Adrian Prows Other Clinician: Referring Physician: Adrian Prows Treating Physician/Extender: Skipper Cliche in Treatment: 16 Education Assessment Education Provided To: Patient Education Topics Provided Wound/Skin Impairment: Methods: Explain/Verbal Responses: State content correctly Electronic Signature(s) Signed: 09/09/2021 12:57:29 PM By: Carlene Coria RN Entered By: Carlene Coria on 09/08/2021 11:01:21 [name] ([id number]) -------------------------------------------------------------------------------- Wound Assessment Details Patient Name: [name] Date of Service: 09/08/2021 10:15 AM Medical Record Number: [id number] Patient Account Number: [id number] Date of Birth/Sex: 1923/08/07 (87 y.o. F) Treating RN: Carlene Coria Primary Care Kalven Ganim: Adrian Prows Other Clinician: Referring Erie Sica: Adrian Prows Treating Somtochukwu Woollard/Extender: Skipper Cliche in Treatment: 16 Wound Status Wound Number: 7 Primary Pressure Ulcer Etiology: Wound Location: Right, Lateral Malleolus Wound Open Wounding Event: Gradually Appeared Status: Date Acquired: 05/12/2021 Comorbid Cataracts, Congestive Heart Failure, Hypertension, Weeks Of Treatment: 16 History: Peripheral Arterial Disease, Osteoarthritis, Neuropathy Clustered Wound: No Photos Wound Measurements Length: (cm) 1 Width: (cm) 0.9 Depth: (cm) 0.3 Area: (cm) 0.707 Volume: (cm) 0.212 % Reduction in Area: 37% % Reduction in Volume: 5.8% Epithelialization: Small (1-33%) Tunneling: No Undermining: No Wound  Description Classification: Category/Stage III Wound Margin: Distinct, outline attached Exudate Amount: Medium Exudate Type: Serosanguineous Exudate Color: red,  brown Foul Odor After Cleansing: No Slough/Fibrino Yes Wound Bed Granulation Amount: Small (1-33%) Exposed Structure Granulation Quality: Red, Pink, Pale Fascia Exposed: No Necrotic Amount: Large (67-100%) Fat Layer (Subcutaneous Tissue) Exposed: Yes Tendon Exposed: No Muscle Exposed: No Joint Exposed: No Bone Exposed: No Treatment Notes Wound #7 (Malleolus) Wound Laterality: Right, Lateral Cleanser Soap and Water Discharge Instruction: Gently cleanse wound with antibacterial soap, rinse and pat dry prior to dressing wounds Peri-Wound Care Speir, [name] ([id number]) Topical Primary Dressing Prisma 4.34 (in) Discharge Instruction: Moisten w/normal saline or sterile water; Cover wound as directed. Do not remove from wound bed. Secondary Dressing ABD Pad 5x9 (in/in) Discharge Instruction: Cover with ABD pad Secured With Compression Wrap Profore Lite LF 3 Multilayer Compression Bandaging System Discharge Instruction: Apply 3 multi-layer wrap as prescribed. Compression Stockings Add-Ons Electronic Signature(s) Signed: 09/09/2021 12:57:29 PM By: Carlene Coria RN Entered By: Carlene Coria on 09/08/2021 10:28:53 [name] ([id number]) -------------------------------------------------------------------------------- Vitals Details Patient Name: [name] Date of Service: 09/08/2021 10:15 AM Medical Record Number: [id number] Patient Account Number: [id number] Date of Birth/Sex: November 10, 1923 (87 y.o. F) Treating RN: Carlene Coria Primary Care Shourya Macpherson: Adrian Prows Other Clinician: Referring Prestyn Stanco: Adrian Prows Treating Inmer Nix/Extender: Skipper Cliche in Treatment: 16 Vital Signs Time Taken: 10:19 Temperature (F): 97.9 Height (in): 62 Pulse (bpm): 74 Weight (lbs):  150 Respiratory Rate (breaths/min): 18 Body Mass Index (BMI): 27.4 Blood Pressure (mmHg): 151/68 Reference Range: 80 - 120 mg / dl Electronic Signature(s) Signed: 09/09/2021 12:57:29 PM By: Carlene Coria RN Entered By: Carlene Coria on 09/08/2021 10:19:58

## 2021-09-15 ENCOUNTER — Other Ambulatory Visit: Payer: Self-pay

## 2021-09-15 ENCOUNTER — Encounter: Payer: Medicare Other | Admitting: Physician Assistant

## 2021-09-15 DIAGNOSIS — L89513 Pressure ulcer of right ankle, stage 3: Secondary | ICD-10-CM | POA: Diagnosis not present

## 2021-09-15 NOTE — Progress Notes (Signed)
LADANA, CHAVERO ([id number]) Visit Report for 09/15/2021 Arrival Information Details Patient Name: [name], [name] Date of Service: 09/15/2021 9:00 AM Medical Record Number: [id number] Patient Account Number: [id number] Date of Birth/Sex: [birth year] (87 y.o. F) Treating RN: Carlene Coria Primary Care Dimple Bastyr: Adrian Prows Other Clinician: Referring Rhona Fusilier: Adrian Prows Treating Janeva Peaster/Extender: Skipper Cliche in Treatment: 33 Visit Information History Since Last Visit All ordered tests and consults were completed: No Patient Arrived: Ambulatory Added or deleted any medications: No Arrival Time: 08:48 Any new allergies or adverse reactions: No Accompanied By: daughter Had a fall or experienced change in No Transfer Assistance: None activities of daily living that may affect Patient Identification Verified: Yes risk of falls: Secondary Verification Process Completed: Yes Signs or symptoms of abuse/neglect since last visito No Patient Requires Transmission-Based No Hospitalized since last visit: No Precautions: Implantable device outside of the clinic excluding No Patient Has Alerts: Yes cellular tissue based products placed in the center Patient Alerts: Patient on Blood since last visit: Thinner Has Dressing in Place as Prescribed: Yes NOT DIABETIC Has Compression in Place as Prescribed: Yes ***ELIQUIS*** Pain Present Now: No Electronic Signature(s) Signed: 09/15/2021 1:01:44 PM By: Carlene Coria RN Entered By: Carlene Coria on 09/15/2021 08:48:27 [name], [name] ([id number]) -------------------------------------------------------------------------------- Clinic Level of Care Assessment Details Patient Name: [name] Date of Service: 09/15/2021 9:00 AM Medical Record Number: [id number] Patient Account Number: [id number] Date of Birth/Sex: Sep 25, 1923 (87 y.o. F) Treating RN: Carlene Coria Primary Care Tashema Tiller: Adrian Prows Other  Clinician: Referring Briggett Tuccillo: Adrian Prows Treating Samantha Ragen/Extender: Skipper Cliche in Treatment: 17 Clinic Level of Care Assessment Items TOOL 1 Quantity Score []  - Use when EandM and Procedure is performed on INITIAL visit 0 ASSESSMENTS - Nursing Assessment / Reassessment []  - General Physical Exam (combine w/ comprehensive assessment (listed just below) when performed on new 0 pt. evals) []  - 0 Comprehensive Assessment (HX, ROS, Risk Assessments, Wounds Hx, etc.) ASSESSMENTS - Wound and Skin Assessment / Reassessment []  - Dermatologic / Skin Assessment (not related to wound area) 0 ASSESSMENTS - Ostomy and/or Continence Assessment and Care []  - Incontinence Assessment and Management 0 []  - 0 Ostomy Care Assessment and Management (repouching, etc.) PROCESS - Coordination of Care []  - Simple Patient / Family Education for ongoing care 0 []  - 0 Complex (extensive) Patient / Family Education for ongoing care []  - 0 Staff obtains Programmer, systems, Records, Test Results / Process Orders []  - 0 Staff telephones HHA, Nursing Homes / Clarify orders / etc []  - 0 Routine Transfer to another Facility (non-emergent condition) []  - 0 Routine Hospital Admission (non-emergent condition) []  - 0 New Admissions / Biomedical engineer / Ordering NPWT, Apligraf, etc. []  - 0 Emergency Hospital Admission (emergent condition) PROCESS - Special Needs []  - Pediatric / Minor Patient Management 0 []  - 0 Isolation Patient Management []  - 0 Hearing / Language / Visual special needs []  - 0 Assessment of Community assistance (transportation, D/C planning, etc.) []  - 0 Additional assistance / Altered mentation []  - 0 Support Surface(s) Assessment (bed, cushion, seat, etc.) INTERVENTIONS - Miscellaneous []  - External ear exam 0 []  - 0 Patient Transfer (multiple staff / Civil Service fast streamer / Similar devices) []  - 0 Simple Staple / Suture removal (25 or less) []  - 0 Complex Staple / Suture removal  (26 or more) []  - 0 Hypo/Hyperglycemic Management (do not check if billed separately) []  - 0 Ankle / Brachial Index (ABI) - do not check if billed separately Has the  patient been seen at the hospital within the last three years: Yes Total Score: 0 Level Of Care: ____ [name] ([id number]) Electronic Signature(s) Signed: 09/15/2021 1:01:44 PM By: Carlene Coria RN Entered By: Carlene Coria on 09/15/2021 09:12:37 Schrimpf, [name] ([id number]) -------------------------------------------------------------------------------- Compression Therapy Details Patient Name: [name] Date of Service: 09/15/2021 9:00 AM Medical Record Number: [id number] Patient Account Number: [id number] Date of Birth/Sex: Dec 05, 1922 (87 y.o. F) Treating RN: Carlene Coria Primary Care Nazar Kuan: Adrian Prows Other Clinician: Referring Levana Minetti: Adrian Prows Treating Kianna Billet/Extender: Skipper Cliche in Treatment: 17 Compression Therapy Performed for Wound Assessment: Wound #7 Right,Lateral Malleolus Performed By: Jake Church, RN Compression Type: Three Layer Electronic Signature(s) Signed: 09/15/2021 1:01:44 PM By: Carlene Coria RN Entered By: Carlene Coria on 09/15/2021 09:11:55 Riles, [name] ([id number]) -------------------------------------------------------------------------------- Encounter Discharge Information Details Patient Name: [name] Date of Service: 09/15/2021 9:00 AM Medical Record Number: [id number] Patient Account Number: [id number] Date of Birth/Sex: [birth year] (87 y.o. F) Treating RN: Carlene Coria Primary Care Armida Vickroy: Adrian Prows Other Clinician: Referring Jayen Bromwell: Adrian Prows Treating Lauralyn Shadowens/Extender: Skipper Cliche in Treatment: 17 Encounter Discharge Information Items Discharge Condition: Stable Ambulatory Status: Walker Discharge Destination: Home Transportation: Private Auto Accompanied By:  daughter Schedule Follow-up Appointment: Yes Clinical Summary of Care: Patient Declined Electronic Signature(s) Signed: 09/15/2021 1:01:44 PM By: Carlene Coria RN Entered By: Carlene Coria on 09/15/2021 09:12:29 [name] ([id number]) -------------------------------------------------------------------------------- Wound Assessment Details Patient Name: [name] Date of Service: 09/15/2021 9:00 AM Medical Record Number: [id number] Patient Account Number: [id number] Date of Birth/Sex: 02/09/1923 (87 y.o. F) Treating RN: Carlene Coria Primary Care Rieley Khalsa: Adrian Prows Other Clinician: Referring Kielan Dreisbach: Adrian Prows Treating Corrinna Karapetyan/Extender: Skipper Cliche in Treatment: 17 Wound Status Wound Number: 7 Primary Pressure Ulcer Etiology: Wound Location: Right, Lateral Malleolus Wound Open Wounding Event: Gradually Appeared Status: Date Acquired: 05/12/2021 Comorbid Cataracts, Congestive Heart Failure, Hypertension, Weeks Of Treatment: 17 History: Peripheral Arterial Disease, Osteoarthritis, Neuropathy Clustered Wound: No Wound Measurements Length: (cm) 1 Width: (cm) 0.9 Depth: (cm) 0.3 Area: (cm) 0.707 Volume: (cm) 0.212 % Reduction in Area: 37% % Reduction in Volume: 5.8% Epithelialization: Small (1-33%) Tunneling: No Undermining: No Wound Description Classification: Category/Stage III Wound Margin: Distinct, outline attached Exudate Amount: Medium Exudate Type: Serosanguineous Exudate Color: red, brown Foul Odor After Cleansing: No Slough/Fibrino Yes Wound Bed Granulation Amount: Small (1-33%) Exposed Structure Granulation Quality: Red, Pink, Pale Fascia Exposed: No Necrotic Amount: Large (67-100%) Fat Layer (Subcutaneous Tissue) Exposed: Yes Tendon Exposed: No Muscle Exposed: No Joint Exposed: No Bone Exposed: No Treatment Notes Wound #7 (Malleolus) Wound Laterality: Right, Lateral Cleanser Soap and Water Discharge  Instruction: Gently cleanse wound with antibacterial soap, rinse and pat dry prior to dressing wounds Peri-Wound Care Topical Primary Dressing Prisma 4.34 (in) Discharge Instruction: Moisten w/normal saline or sterile water; Cover wound as directed. Do not remove from wound bed. Secondary Dressing ABD Pad 5x9 (in/in) Discharge Instruction: Cover with ABD pad Secured With Compression Wrap Profore Lite LF 3 Multilayer Compression Gaston [name], [name] ([id number]) Discharge Instruction: Apply 3 multi-layer wrap as prescribed. Compression Stockings Add-Ons Electronic Signature(s) Signed: 09/15/2021 1:01:44 PM By: Carlene Coria RN Entered By: Carlene Coria on 09/15/2021 09:11:32

## 2021-09-22 ENCOUNTER — Other Ambulatory Visit: Payer: Self-pay

## 2021-09-22 ENCOUNTER — Encounter: Payer: Medicare Other | Admitting: Physician Assistant

## 2021-09-22 DIAGNOSIS — L89513 Pressure ulcer of right ankle, stage 3: Secondary | ICD-10-CM | POA: Diagnosis not present

## 2021-09-22 NOTE — Progress Notes (Addendum)
[name], [name] ([id number]) Visit Report for 09/22/2021 Chief Complaint Document Details Patient Name: [name], [name]. Date of Service: 09/22/2021 10:15 AM Medical Record Number: [id number] Patient Account Number: [id number] Date of Birth/Sex: [birth year] (87 y.o. F) Treating RN: Carlene Coria Primary Care Provider: Adrian Prows Other Clinician: Referring Provider: Adrian Prows Treating Provider/Extender: Skipper Cliche in Treatment: 18 Information Obtained from: Patient Chief Complaint Right foot ulcer Electronic Signature(s) Signed: 09/22/2021 10:45:57 AM By: Worthy Keeler PA-C Entered By: Worthy Keeler on 09/22/2021 10:45:56 [name], [name] ([id number]) -------------------------------------------------------------------------------- Debridement Details Patient Name: [name] Date of Service: 09/22/2021 10:15 AM Medical Record Number: [id number] Patient Account Number: [id number] Date of Birth/Sex: 1923/10/01 (87 y.o. F) Treating RN: Carlene Coria Primary Care Provider: Adrian Prows Other Clinician: Referring Provider: Adrian Prows Treating Provider/Extender: Skipper Cliche in Treatment: 18 Debridement Performed for Wound #7 Right,Lateral Malleolus Assessment: Performed By: Physician Tommie Sams., PA-C Debridement Type: Debridement Level of Consciousness (Pre- Awake and Alert procedure): Pre-procedure Verification/Time Out Yes - 10:50 Taken: Start Time: 10:50 Total Area Debrided (L x W): 1 (cm) x 1 (cm) = 1 (cm) Tissue and other material Viable, Non-Viable, Subcutaneous, Skin: Dermis , Skin: Epidermis debrided: Level: Skin/Subcutaneous Tissue Debridement Description: Excisional Instrument: Curette Bleeding: Minimum Hemostasis Achieved: Pressure End Time: 10:52 Procedural Pain: 0 Post Procedural Pain: 0 Response to Treatment: Procedure was tolerated well Level of Consciousness (Post- Awake and  Alert procedure): Post Debridement Measurements of Total Wound Length: (cm) 1 Stage: Category/Stage III Width: (cm) 1 Depth: (cm) 0.3 Volume: (cm) 0.236 Character of Wound/Ulcer Post Debridement: Improved Post Procedure Diagnosis Same as Pre-procedure Electronic Signature(s) Signed: 09/23/2021 4:19:44 PM By: Carlene Coria RN Signed: 09/23/2021 4:28:58 PM By: Worthy Keeler PA-C Entered By: Carlene Coria on 09/22/2021 10:51:47 [name], [name] ([id number]) -------------------------------------------------------------------------------- HPI Details Patient Name: [name] Date of Service: 09/22/2021 10:15 AM Medical Record Number: [id number] Patient Account Number: [id number] Date of Birth/Sex: 1923-06-14 (87 y.o. 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 Dr. Amalia Hailey the podiatrist saw her on 07/20/2017 and sent her to the wound center for possible hyperbaric oxygen therapy. past medical history of peripheral vascular disease, varicose veins, status post appendectomy, basal cell carcinoma excision from the left leg, cholecystectomy, pacemaker placement, right lower extremity angiography done by Dr. dew in March 2017 with placement of a stent. there is also note of a successful ablation of the right small saphenous vein done which was reviewed by ultrasound on 10/24/2016. the patient had a right small saphenous vein ablation done on 10/20/2016. The patient has never been a smoker. She has been seen by Dr. Corene Cornea dew the vascular surgeon who most recently saw her on 06/15/2017 for evaluation of  ongoing problems with right leg swelling. She had a lower extremity arterial duplex examination done(02/13/17) which showed patent distal right superficial femoral artery stent and above-the-knee popliteal stent without evidence of restenosis. The ABI was more than 1.3 on the right and more than 1.3 on the left. This was consistent with noncompressible arteries due to medial calcification. The right great toe pressure and PPG waveforms are within normal limits and the left great toe pressure and PPG waveforms are decreased.  he recommended she continue to wear her compression stockings and continue with elevation. She is scheduled to have a noninvasive arterial study in the near future 08/16/2017 -- had a lower extremity arterial duplex examination done which showed patent distal right superficial femoral artery stent and above-the-knee popliteal stent without evidence of restenosis. The ABI was more than 1.3 on the right and more than 1.3 on the left. This was consistent with noncompressible arteries due to medial calcification. The right great toe pressure and PPG waveforms are within normal limits and the left great toe pressure and PPG waveforms are decreased. the x-ray of the right ankle has not yet been done 08/24/2017 -- had a right ankle x-ray -- IMPRESSION:1. No fracture, bone lesion or evidence of osteomyelitis. 2. Lateral soft tissue swelling with a soft tissue ulcer. she has not yet seen the vascular surgeon for review 08/31/17 on evaluation today patient's wound appears to be showing signs of improvement. She still with her appointment with vascular in order to review her results of her vascular study and then determine if any intervention would be recommended at that time. No fevers, chills, nausea, or vomiting noted at this time. She has been tolerating the dressing changes without complication. 09/28/17 on evaluation today patient's wound appears to show signs of good improvement in  regard to the granulation tissue which is surfacing. There is still a layer of slough covering the wound and the posterior portion is still significantly deeper than the anterior nonetheless there has been some good sign of things moving towards the better. She is going to go back to Dr. dew for reevaluation to ensure her blood flow is still appropriate. That will be before her next evaluation with [place] next week. No fevers, chills, nausea, or vomiting noted at this time. Patient does have some discomfort rated to be a 3-4/10 depending on activity specifically cleansing the wound makes it worse. 10/05/2017 -- the patient was seen by Dr. Lucky Cowboy last week and noninvasive studies showed a normal right ABI with brisk triphasic waveforms consistent with no arterial insufficiency including normal digital pressures. The duplex showed a patent distal right SFA stent and the proximal SFA was also normal. He was pleased with her test and thought she should have enough of perfusion for normal wound healing. He would see her back in 6 months time. 12/21/17 on evaluation today patient appears to be doing fairly well in regard to her right lateral ankle wound. Unfortunately the main issue that she is expansion at this point is that she is having some issues with what appears to be some cellulitis in the right anterior shin. She has also been noting a little bit of uncomfortable feeling especially last night and her ankle area. I'm afraid that she made the developing a little bit of an infection. With that being said I think it is in the early stages. 12/28/17 on evaluation today patient's ankle appears to be doing excellent. She's making good progress at this point the cellulitis seems to have improved after last week's evaluation. Overall she is having no significant discomfort which is excellent news. She does have an appointment with Dr. dew on March 29, 2018 for reevaluation in regard to the stent he placed. She seems  to have excellent blood flow in the right lower extremity. 01/19/12 on evaluation today patient's wound appears to be doing very well. In fact she does not appear to require debridement at this point, there's no evidence of infection, and overall from  the standpoint of the wound she seems to be doing very well. With that being said I believe that it may be time to switch to different dressing away from the [facility] Dressing she tells me she does have a lot going on her friend actually passed away yesterday and she's also having a lot of issues with her husband this obviously is weighing heavy on her as far as your thoughts and concerns today. 01/25/18 on evaluation today patient appears to be doing fairly well in regard to her right lateral malleolus. She has been tolerating the dressing changes without complication. Overall I feel like this is definitely showing signs of improvement as far as how the overall appearance of the wound is there's also evidence of epithelium start to migrate over the granulation tissue. In general I think that she is progressing nicely as far as the wound is concerned. The only concern she really has is whether or not we can switch to every other week visits in order to avoid having as many appointments as her daughters have a difficult time getting her to her appointments as well as the patient's husband to his he is not doing very well at this point. 02/22/18 on evaluation today patient's right lateral malleolus ulcer appears to be doing great. She has been tolerating the dressing changes without complication. Overall you making excellent progress at this time. Patient is having no significant discomfort. ALEXYSS, BALZARINI ([id number]) 03/15/18 on evaluation today patient appears to be doing much more poorly in regard to her right lateral ankle ulcer at this point. Unfortunately since have last seen her her husband has passed just a few days ago is obviously weighed  heavily on her her daughter also had surgery well she is with her today as usual. There does not appear to be any evidence of infection she does seem to have significant contusion/deep tissue injury to the right lateral malleolus which was not noted previous when I saw her last. It's hard to tell of exactly when this injury occurred although during the time she was spending the night in the hospital this may have been most likely. 03/22/18 on evaluation today patient appears to actually be doing very well in regard to her ulcer. She did unfortunately have a setback which was noted last week however the good news is we seem to be getting back on track and in fact the wound in the core did still have some necrotic tissue which will be addressed at this point today but in general I'm seeing signs that things are on the up and up. She is glad to hear this obviously she's been somewhat concerned that due to the how her wound digressed more recently. 03/29/18 on evaluation today patient appears to be doing fairly well in regard to her right lower extremity lateral malleolus ulcer. She unfortunately does have a new area of pressure injury over the inferior portion where the wound has opened up a little bit larger secondary to the pressure she seems to be getting. She does tell me sometimes when she sleeps at night that it actually hurts and does seem to be pushing on the area little bit more unfortunately. There does not appear to be any evidence of infection which is good news. She has been tolerating the dressing changes without complication. She also did have some bruising in the left second and third toes due to the fact that she may have bump this or injured it although she has  neuropathy so she does not feel she did move recently that may have been where this came from. Nonetheless there does not appear to be any evidence of infection at this time. 04/12/18 on evaluation today patient's wound on the right  lateral ankle actually appears to be doing a little bit better with a lot of necrotic docking tissue centrally loosening up in clearing away. However she does have the beginnings of a deep tissue injury on the left lateral malleolus likely due to the fact we've been trying offload the right as much as we have. I think she may benefit from an assistive soft device to help with offloading and it looks like they're looking at one of the doughnut conditions that wraps around the lower leg to offload which I think will definitely do a good job. With that being said I think we definitely need to address this issue on the left before it becomes a wound. Patient is not having significant pain. 04/19/18 on evaluation today patient appears to be doing excellent in regard to the progress she's made with her right lateral ankle ulcer. The left ankle region which did show evidence of a deep tissue injury seems to be resolving there's little fluid noted underneath and a blister there's nothing open at this point in time overall I feel like this is progressing nicely which is good news. She does not seem to be having significant discomfort at this point which is also good news. 04/25/18-She is here in follow up evaluation for bilateral lateral malleolar ulcers. The right lateral malleolus ulcer with pale subcutaneous tissue exposure, central area of ulcer with tendon/periosteum exposed. The left lateral malleolus ulcer now with central area of nonviable tissue, otherwise deep tissue injury. She is wearing compression wraps to the left lower extremity, she will place the right lower extremity compression wraps on when she gets home. She will be out of town over the weekend and return next week and follow-up appointment. She completed her doxycycline this morning 05/03/18 on evaluation today patient appears to be doing very well in regard to her right lateral ankle ulcer in general. At least she's showing some signs of  improvement in this regard. Unfortunately she has some additional injury to the left lateral malleolus region which appears to be new likely even over the past several days. Again this determination is based on the overall appearance. With that being said the patient is obviously frustrated about this currently. 05/10/18-She is here in follow-up evaluation for bilateral lateral malleolar ulcers. She states she has purchased offloading shoes/boots and they will arrive tomorrow. She was asked to bring them in the office at next week's appointment so her provider is aware of product being utilized. She continues to sleep on right or left side, she has been encouraged to sleep on her back. The right lateral malleolus ulcer is precariously close to peri-osteum; will order xray. The left lateral malleolus ulcer is improved. Will switch back to santyl; she will follow up next week. 05/17/18 on evaluation today patient actually appears to be doing very well in regard to her malleolus her ulcers compared to last time I saw them. She does not seem to have as much in the way of contusion at this point which is great news. With that being said she does continue to have discomfort and I do believe that she is still continuing to benefit from the offloading/pressure reducing boots that were recommended. I think this is the key to trying to  get this to heal up completely. 05/24/18 on evaluation today patient actually appears to be doing worse at this point in time unfortunately compared to her last week's evaluation. She is having really no increased pain which is good news unfortunately she does have more maceration in your theme and noted surrounding the right lateral ankle the left lateral ankle is not really is erythematous I do not see signs of the overt cellulitis on that side. Unfortunately the wounds do not seem to have shown any signs of improvement since the last evaluation. She also has significant swelling  especially on the right compared to previous some of this may be due to infection however also think that she may be served better while she has these wounds by compression wrapping versus continuing to use the Juxta-Lite for the time being. Especially with the amount of drainage that she is experiencing at this point. No fevers, chills, nausea, or vomiting noted at this time. 05/31/18 on evaluation today patient appears to actually be doing better in regard to her right lateral lower extremity ulcer specifically on the malleolus region. She has been tolerating the antibiotic without complication. With that being said she still continues to have issues but a little bit of redness although nothing like she what she was experiencing previous. She still continues to pressure to her ankle area she did get the problem on offloading boots unfortunately she will not wear them she states there too uncomfortable and she can't get in and out of the bed. Nonetheless at this point her wounds seem to be continually getting worse which is not what we want I'm getting somewhat concerned about her progress and how things are going to proceed if we do not intervene in some way shape or form. I therefore had a very lengthy conversation today about offloading yet again and even made a specific suggestion for switching her to a memory foam mattress and even gave the information for a specific one that they could look at getting if it was something that they were interested in considering. She does not want to be considered for a hospital bed air mattress although honestly insurance would not cover it that she does not have any wounds on her trunk. 06/14/18 on evaluation today both wounds over the bilateral lateral malleolus her ulcers appear to be doing better there's no evidence of pressure injury at this point. She did get the foam mattress for her bed and this does seem to have been extremely beneficial for her in my  pinion. Her daughter states that she is having difficulty getting out of bed because of how soft it is. The patient also relates this to be. Nonetheless I do feel like she's actually doing better. Unfortunately right after and around the time she was getting the mattress she also sustained a fall when she got up to go pick up the phone and ended up injuring her right elbow she has 18 sutures in place. We are not caring for this currently although home health is going to be taking the sutures out shortly. Nonetheless this may be something that we need to evaluate going forward. It depends on how well it has or has not healed in the end. She also recently saw an orthopedic specialist for an injection in the right shoulder just before her fall unfortunately the fall seems to have worsened her pain. 06/21/18 on evaluation today patient appears to be doing about the same in regard to her lateral malleolus  ulcers. Both appear to be just a little bit deeper but again we are clinging away the necrotic and dead tissue which I think is why this is progressing towards a deeper realm as opposed [name], [name]. ([id number]) to improving from my measurement standpoint in that regard. Nonetheless she has been tolerating the dressing changes she absolutely hates the memory foam mattress topper that was obtained for her nonetheless I do believe this is still doing excellent as far as taking care of excess pressure in regard to the lateral malleolus regions. She in fact has no pressure injury that I see whereas in weeks past it was week by week I was constantly seeing new pressure injuries. Overall I think it has been very beneficial for her. 07/03/18; patient arrives in my clinic today. She has deep punched out areas over her bilateral lateral malleoli. The area on the right has some more depth. We spent a lot of time today talking about pressure relief for these areas. This started when her daughter asked for a  prescription for a memory foam mattress. I have never written a prescription for a mattress and I don't think insurances would pay for that on an ordinary bed. In any case he came up that she has foam boots that she refuses to wear. I would suggest going to these before any other offloading issues when she is in bed. They say she is meticulous about offloading this the rest of the day 07/10/18- She is seen in follow-up evaluation for bilateral, lateral malleolus ulcers. There is no improvement in the ulcers. She has purchased and is sleeping on a memory foam mattress/overlay, she has been using the offloading boots nightly over the past week. She has a follow up appointment with vascular medicine at the end of October, in my opinion this follow up should be expedited given her deterioration and suboptimal TBI results. We will order plain film xray of the left ankle as deeper structures are palpable; would consider having MRI, regardless of xray report(s). The ulcers will be treated with iodoflex/iodosorb, she is unable to safely change the dressings daily with santyl. 07/19/18 on evaluation today patient appears to be doing in general visually well in regard to her bilateral lateral malleolus ulcers. She has been tolerating the dressing changes without complication which is good news. With that being said we did have an x-ray performed on 07/12/18 which revealed a slight loosen see in the lateral portion of the distal left fibula which may represent artifact but underline lytic destruction or osteomyelitis could not be excluded. MRI was recommended. With that being said we can see about getting the patient scheduled for an MRI to further evaluate this area. In fact we have that scheduled currently for August 20 19,019. 07/26/18 on evaluation today patient's wound on the right lateral ankle actually appears to be doing fairly well at this point in my pinion. She has made some good progress currently. With  that being said unfortunately in regard to the left lateral ankle ulcer this seems to be a little bit more problematic at this time. In fact as I further evaluated the situation she actually had bone exposed which is the first time that's been the case in the bone appear to be necrotic. Currently I did review patient's note from Dr. Bunnie Domino office with [facility]. He stated that ABI was 1.26 on the right and 0.95 on the left with good waveforms. Her perfusion is stable not reduced from  previous studies and her digital waveforms were pretty good particularly on the right. His conclusion upon review of the note was that there was not much she could do to improve her perfusion and he felt she was adequate for wound healing. His suggestion was that she continued to see [place] and consider a synthetic skin graft if there was no underlying infection. He plans to see her back in six months or as needed. 08/01/18 on evaluation today patient appears to be doing better in regard to her right lateral ankle ulcer. Her left lateral ankle ulcer is about the same she still has bone involvement in evidence of necrosis. There does not appear to be evidence of infection at this time On the right lateral lower extremity. I have started her on the Augmentin she picked this up and started this yesterday. This is to get her through until she sees infectious disease which is scheduled for 08/12/18. 08/06/18 on evaluation today patient appears to be doing rather well considering my discussion with patient's daughter at the end of last week. The area which was marked where she had erythema seems to be improved and this is good news. With that being said overall the patient seems to be making good improvement when it comes to the overall appearance of the right lateral ankle ulcer although this has been slow she at least is coming around in this regard. Unfortunately in regard to the left lateral ankle ulcer this  is osteomyelitis based on the pathology report as well is bone culture. Nonetheless we are still waiting CT scan. Unfortunately the MRI we originally ordered cannot be performed as the patient is a pacemaker which I had overlooked. Nonetheless we are working on the CT scan approval and scheduling as of now. She did go to the hospital over the weekend and was placed on IV Cefzo for a couple of days. Fortunately this seems to have improved the erythema quite significantly which is good news. There does not appear to be any evidence of worsening infection at this time. She did have some bleeding after the last debridement therefore I did not perform any sharp debridement in regard to left lateral ankle at this point. Patient has been approved for a snap vac for the right lateral ankle. 08/14/18; the patient with wounds over her bilateral lateral malleoli. The area on the right actually looks quite good. Been using a snap back on this area. Healthy granulation and appears to be filling in. Unfortunately the area on the left is really problematic. 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 Also noted to have cellulitis. She saw Dr. Novella Olive of infectious disease today and was put on linezolid. We are able to verify this with her pharmacy. She is completed the Augmentin that she was already on. We've been using Iodoflex to this area 08/23/18 on evaluation today patient's wounds both actually appear to be doing better compared to my prior evaluations. Fortunately she showing signs of good improvement in regard to the overall wound status especially where were using the snap vac on the right. In regard to left lateral malleolus the wound bed actually appears to be much cleaner than previously noted. I do not feel any phone directly probed during evaluation today and though there is tendon noted this does not appear to be necrotic  it's actually fairly good as far as the overall appearance of the tendon is concerned.  In general the wound bed actually appears to be doing significantly better than it was previous. Patient is currently in the care of [name] and I did review that note today. He actually has her on two weeks of linezolid and then following the patient will be on 1-2 months of Keflex. That is the plan currently. She has been on antibiotics therapy as prescribed by myself initially starting on July 30, 2018 and has been on that continuously up to this point. 08/30/18 on evaluation today patient actually appears to be doing much better in regard to her right lateral malleolus ulcer. She has been tolerating the dressing changes specifically the snap vac without complication although she did have some issues with the seal currently. Apparently there was some trouble with getting it to maintain over the past week past Sunday. Nonetheless overall the wound appears better in regard to the right lateral malleolus region. In regard to left lateral malleolus this actually show some signs of additional granulation although there still tendon noted in the base of the wound this appears to be healthy not necrotic in any way whatsoever. We are considering potentially using a snap vac for the left lateral malleolus as well the product wrap from [employer], [place], was present in the clinic today we're going to see this patient I did have her come in with me after obtaining consent from the patient and her daughter in order to look at the wound and see if there's any recommendation one way or another as to whether or not they felt the snapback could be beneficial for the left lateral malleolus region. But the conclusion was that it might be but that this is definitely a little bit deeper wound than what traditionally would be utilized for a snap vac. 09/06/18 on evaluation today patient actually appears to be doing excellent in my pinion  in regard to both ankle ulcers. She has been tolerating the dressing changes without complication which is great news. Specifically we have been using the snap vac. In regard to the right ankle I'm not even sure that this is going to be necessary for today and following as the wound has filled in quite nicely. In regard to the left ankle I do believe [name], [name] ([id number]) that we're seeing excellent epithelialization from the edge as well as granulation in the central portion the tendon is still exposed but there's no evidence of necrotic bone and in general I feel like the patient has made excellent progress even compared to last week with just one week of the snap vac. 09/11/18; this is a patient who has wounds on her bilateral lateral malleoli. Initially both of these were deep stage IV wounds in the setting of chronic arterial insufficiency. She has been revascularized. As I understand think she been using snap vacs to both of these wounds however the area on the right became more superficial and currently she is only using it on the left. Using silver collagen on the right and silver collagen under the back on the left I believe 09/19/18 on evaluation today patient actually appears to be doing very well in regard to her lateral malleolus or ulcers bilaterally. She has been tolerating the dressing changes without complication. Fortunately there does not appear to be any evidence of infection at this time. Overall I feel like she is improving in an excellent manner and I'm very pleased with the fact that everything seems to be turning towards the better for her. This  has obviously been a long road. 09/27/18 on evaluation today patient actually appears to be doing very well in regard to her bilateral lateral malleolus ulcers. She has been tolerating the dressing changes without complication. Fortunately there does not appear to be any evidence of infection at this time which is also great  news. No fevers, chills, nausea, or vomiting noted at this time. Overall I feel like she is doing excellent with the snap vac on the left malleolus. She had 40 mL of fluid collection over the past week. 10/04/18 on evaluation today patient actually appears to be doing well in regard to her bilateral lateral malleolus ulcers. She continues to tolerate the dressing changes without complication. One issue that I see is the snap vac on the left lateral malleolus which appears to have sealed off some fluid underlying this area and has not really allowed it to heal to the degree that I would like to see. For that reason I did suggest at this point we may want to pack a small piece of packing strip into this region to allow it to more effectively wick out fluid. 10/11/18 in general the patient today does not feel that she has been doing very well. She's been a little bit lethargic and subsequently is having bodyaches as well according to what she tells me today. With that being said overall she has been concerned with the fact that something may be worsening although to be honest her wounds really have not been appearing poorly. She does have a new ulcer on her left heel unfortunately. This may be pressure related. Nonetheless it seems to me to have potentially started at least as a blister I do not see any evidence of deep tissue injury. In regard to the left ankle the snap vac still seems to be causing the ceiling off of the deeper part of the wound which is in turn trapping fluid. I'm not extremely pleased with the overall appearance as far as progress from last week to this week therefore I'm gonna discontinue the snap vac at this point. 10/18/18 patient unfortunately this point has not been feeling well for the past several days. She was seen by Grayland Ormond her primary care provider who is a Librarian, academic at [name]. Subsequently she states that she's been very weak and generally feeling  malaise. No fevers, chills, nausea, or vomiting noted at this time. With that being said bloodwork was performed at the PCP office on the 11th of this month which showed a white blood cell count of 10.7. This was repeated today and shows a white blood cell count of 12.4. This does show signs of worsening. Coupled with the fact that she is feeling worse and that her left ankle wound is not really showing signs of improvement I feel like this is an indication that the osteomyelitis is likely exacerbating not improving. Overall I think we may also want to check her C-reactive protein and sedimentation rate. Actually did call [facility] office this afternoon while the patient was in the office here with me. Subsequently based on the findings we discussed treatment possibilities and I think that it is appropriate for [place] to go ahead and initiate treatment with doxycycline which I'm going to do. Subsequently he did agree to see about adding a CRP and sedimentation rate to her orders. If that has not already been drawn to where they can run it they will contact the patient she can come back to have that  check. They are in agreement with plan as far as the patient and her daughter are concerned. Nonetheless also think we need to get in touch with Dr. Henreitta Leber office to see about getting the patient scheduled with him as soon as possible. 11/08/18 on evaluation today patient presents for follow-up concerning her bilateral foot and ankle ulcers. I did do an extensive review of her chart in epic today. Subsequently she was seen by [name] he did initiate Cefepime IV antibiotic therapy. Subsequently she had some issues with her PICC line this had to be removed because it was coiled and then replaced. Fortunately that was now settled. Unfortunately she has continued have issues with her left heel as well as the issues that she is experiencing with her bilateral lateral malleolus regions. I do believe however  both areas seem to be doing a little bit better on evaluation today which is good news. No fevers, chills, nausea, or vomiting noted at this time. She actually has an angiogram schedule with Dr. dew on this coming Monday, November 11, 2018. Subsequently the patient states that she is feeling much better especially than what she was roughly 2 weeks ago. She actually had to cancel an appointment because she was feeling so poorly. No fevers, chills, nausea, or vomiting noted at this time. 11/15/18 on evaluation today patient actually is status post having had her angiogram with Dr. dew Monday, four days ago. 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 He had to go and work on several areas of the vasculature fortunately he was able to obtain no more than a 30% residual stenosis throughout post procedure. I reviewed this note today. I think this will definitely help with healing at this time. Fortunately there does not appear to be any signs of infection and I do feel like ratio already has a better appearance to it. 11/22/18 upon evaluation today patient actually appears to be doing very well in regard to her wounds in general. The right lateral malleolus looks excellent the heel looks better in the left lateral malleolus also appears to be doing a little better. With that being said the right second toe actually appears to be open and training we been watching this is been dry and stable but now is open. 12/03/2018 Seen today for follow-up and management of multiple bilateral lower extremity wounds. New pressure injury of the great toe which is closed at this time. Wound of the right distal second toe appears larger today with deep undermining and a pocket of fluid present within the undermining region. Left and right malleolus is wounds are stable today with no signs and symptoms of infection.Denies any needs or concerns during exam today. 12/13/18 on evaluation today patient  appears to be doing somewhat better in regard to her left heel ulcer. She also seems to be completely healed in regard to the right lateral malleolus ulcer. The left malleolus ulcer is smaller what unfortunately the wounds which are new over the first and second toes of the right foot are what are most concerning at this point especially the second. Both areas did require sharp debridement today. 12/20/18 on evaluation today patient's wound actually appears to be doing better in regard to left lateral ankle and her right lateral ankle continues to remain healed. The hill ulcer on the left is improved. She does have improvement noted as well in regard to both toe ulcers. Overall I'm very pleased in this regard. No  fevers, chills, nausea, or vomiting noted at this time. 12/23/18 on evaluation today patient is seen after she had her toenails trimmed at the podiatrist office due to issues with her right great toe. There was what appeared to be dark eschar on the surface of the wound which had her in the podiatrist concerned. Nonetheless as I remember that during the last office visit I had utilize silver nitrate of this area I was much less concerned about the situation. Subsequently I was able to clean off much of this tissue without any complication today. This does not appear to show any signs of infection and actually look somewhat better Stillson, [name] ([id number]) compared to last time post debridement. Her second toe on the right foot actually had callous over and there did appear still be some fluid underneath this that would require debridement today. 12/27/18 on evaluation today patient actually appears to be showing signs of improvement at all locations. Even the left lateral ankle although this is not quite as great as the other sites. Fortunately there does not appear to be any signs of infection at this time and both of her toes on the right foot seem to be showing signs of improvement  which is good news and very pleased in this regard. 01/03/19 on evaluation today patient appears to be doing better for the most part in regard to her wounds in particular. There does not appear to be any evidence of infection at this time which is good news. Fortunately there is no sign of really worsening anywhere except for the right great toe which she does have what appears to be a bruise/deep tissue injury which is very superficial and already resolving. I'm not sure where this came from I questioned her extensively and she does not recall what may have happened with this. Other than that the patient seems to be doing well even the left lateral ankle ulcer looks good and is getting smaller. 01/10/19 on evaluation today patient appears to be doing well in regard to her left heel wound and both of her toe wounds. Overall I feel like there is definitely improvement here and I'm happy in that regard. With that being said unfortunately she is having issues with the left lateral malleolus ulcer which unfortunately still has a lot of depth to it. This is gonna be a very difficult wound for [place] to be able to truly get to heal. I may want to consider some type of skin substitute to see if this would be of benefit for her. I'll discuss this with her more the next visit most likely. This was something I thought about more at the end of the visit when I was Artie out of the room and the patient had been discharged. 01/17/19 on evaluation today patient appears to be doing very well in regard to her wounds in general. She's been making excellent progress at this time. Fortunately there's no sign of infection at this time either. No fevers, chills, nausea, or vomiting noted at this time. The biggest issue is still her left lateral malleolus where it appears to be doing well and is getting smaller but still shows a small corner where this is deeper and goes down into what appears to be the joint space. Nonetheless  this is taking much longer to heal although it still looks better in smaller than previous evaluations. 01/24/19 on evaluation today patient's wounds actually appear to be doing rather well in general overall. She did require  some sharp debridement in regard to the right great toe but everything else appears to be doing excellent no debridement was even necessary. No fevers, chills, nausea, or vomiting noted at this time. 01/31/19 on evaluation today patient actually appears to be doing much better in regard to her left foot wound on the heel as well as the ankle. The right great toe appears to be a little bit worse today this had callous over and trapped a lot of fluid underneath. Fortunately there's no signs of infection at any site which is great news. 02/07/19 on evaluation today patient actually appears to be doing decently well in regard to all of her ulcers at this point. No sharp debridement was required she is a little bit of hyper granulation in regard to the left lateral ankle as well as the left heel but the hill itself is almost completely healed which is excellent news. Overall been very pleased in this regard. 02/14/19 on evaluation today patient actually appears to be doing very well in regard to her ulcers on the right first toe, left lateral malleolus, and left heel. In fact the heel is almost completely healed at this point. The patient does not show any signs of infection which is good news. Overall very pleased with how things have progressed. 04/18/19 Telehealth Evaluation During the COVID-19 National Emergency: Verbal Consent: Obtained from patient Allergies: reviewed and the active list is current. Medication changes: patient has no current medication changes. COVID-19 Screening: 1. Have you traveled internationally or on a cruise ship in the last 14 dayso No 2. Have you had contact with someone with or under investigation for COVID-19o No 3. Have you had a fever, cough, sore  throat, or experiencing shortness of breatho No on evaluation today actually did have a visit with this patient through a telehealth encounter with her home health nurse. Subsequently it was noted that the patient actually appears to be doing okay in regard to her wounds both the right great toe as well as the left lateral malleolus have shown signs of improvement although this in your theme around the left lateral malleolus there eschar coverings for both locations. The question is whether or not they are actually close and whether or not home health needs to discharge the patient or not. Nonetheless my concern is this point obviously is that without actually seeing her and being able to evaluate this directly I cannot ensure that she is completely healed which is the question that I'm being asked. 04/22/19 on evaluation today patient presents for her first evaluation since last time I saw her which was actually February 14, 2019. I did do a telehealth visit last week in which point it was questionable whether or not she may be healed and had to bring her in today for confirmation. With that being said she does seem to be doing quite well at this point which is good news. There does not appear to be any drainage in the deed I believe her wounds may be healed. Readmission: 09/04/2019 on evaluation today patient appears to be doing unfortunately somewhat more poorly in regard to her left foot ulcer secondary to a wound that began on 08/21/2019 at least when she first noticed this. Fortunately she has not had any evidence of active infection at this time. Systemically. I also do not necessarily see any evidence of infection at the blister/wound site on the first metatarsal head plantar aspect. This almost appears to be something that may have just  rubbed inappropriately causing this to breakdown. They did not want a wait too long to come in to be seen as again she had significant issues in the past with  wounds that took quite a while to heal in fact it was close to 2 years. Nonetheless this does not appear to be quite that bad but again we do need to remove some of the necrotic tissue from the surface of the wound to tell exactly the extent. She does not appear to have any significant arterial disease at this point and again her last ABIs and TBI's are recorded above in the alert section her left ABI was 1.27 with a TBI of 0.72 to the right ABI 1.08 with a TBI of 0.39. Other than this the patient has been doing quite well since I last saw her and that was in May 2020. 09/11/2019 on evaluation today patient appeared to be doing very well with regard to her plantar foot ulcer on the left. In fact this appears to be almost completely healed which is awesome. That is after just 1 week of intervention. With that being said there is no signs of active infection at this time. NAVEEN, [name] ([id number]) 09/18/2019 on evaluation today patient actually appears to be doing excellent in fact she is completely healed based on what I am seeing at this point. Fortunately there is no signs of active infection at this time and overall patient is very pleased to hear that this area has healed so quickly. Readmission: 05/13/2021 upon evaluation today this patient presents for reevaluation here in the clinic. This is a wound that actually we previously took care of. She had 1 on the right ankle and the left the left turned out to be be harder due to to heal but nonetheless is doing great at this point as the right that has reopened and it was noted first just several weeks ago with a scab over it and came off in just the past few days. Fortunately there does not appear to be any obvious evidence of significant active infection at this time which is great news. No fevers, chills, nausea, vomiting, or diarrhea. 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 There does not  appear to be any signs of this interfering in any way with her wound. She does have swelling we previously had compression socks for her ordered but again it does not look like she wears these on a regular basis by any means. 05/26/2021 upon evaluation today patient appears to be doing well with regard to her wound which is actually showing signs of excellent improvement. There does not appear to be any signs of active infection which is great news and overall very pleased with where things stand today. No fevers, chills, nausea, vomiting, or diarrhea. 06/02/2021 upon evaluation today patient's wound actually showing signs of excellent improvement. Fortunately there does not appear to be any signs of active infection which is great news. I think the patient is making good progress with regard to her wounds in general. 06/09/2021 upon evaluation today patient appears to be doing excellent in regard to her wounds currently. Fortunately there does not appear to be any signs of active infection which is great news. No fevers, chills, nausea, vomiting, or diarrhea. Overall extremely pleased with where things stand today. I think the patient is making excellent progress. 06/16/2021 upon evaluation today patient appears to be doing well in regard to her  wound. This is going require little bit of debridement today and that was discussed with the patient. Otherwise she seems to be doing quite well and I am actually very pleased with where things stand at this point. No fevers, chills, nausea, vomiting, or diarrhea. 06/23/2021 upon evaluation today patient appears to be doing well with regard to her wounds. She has been tolerating the dressing changes without complication. Fortunately there does not appear to be any evidence of infection and she has not had air in her home which she actually lives at an assisted living that got fixed this morning. With that being said because of that her wrap has been extremely hot  and bothersome for her over the past week. 06/30/2021 upon evaluation today patient is actually making excellent progress in regard to her ankle ulcer. She has been tolerating the dressing changes without complication and overall extremely pleased with where things stand there does not appear to be any evidence of active infection which is great news. No fevers, chills, nausea, vomiting, or diarrhea. 07/07/21 upon evaluation today patients and culture on the right actually appears to be doing quite well. There does not appear to be any signs of infection and overall very pleased with where things stand today. No fevers, chills, nausea, or vomiting noted at this time. 07/14/2021 unfortunately the patient today has some evidence of deep tissue injury and pressure getting to the ankle region. Again I am not exactly sure what is going on here but this is very similar to issues that we have had in the past. I explained to the patient that she needs to be very mindful of exactly what is happening I think sleeping in bed is probably the main issue here although there could be other culprits I am not sure what else would potentially lead to this kind of a problem for her. 07/21/2021 upon evaluation today patient's wound actually showing signs of improvement compared to last week. Fortunately there does not appear to be any signs of active infection which is great news and overall very pleased with where things stand in that regard. With that being said I do believe that she is continuing to show signs of overall of getting better although I think this is still basically about what we were 2 weeks ago due to the worsening and now improvement. 07/28/2021 upon evaluation today patient appears to be doing well with regard to her wound. She does have some slough buildup on the surface of the wound which I would have to manage today. Fortunately there is no sign of active infection at this time. No fevers, chills,  nausea, vomiting, or diarrhea. 08/04/2021 upon evaluation today patient appears to be doing about the same in regard to her wound. To be perfectly honest I am beginning to be a little bit concerned about the overall appearance of the wound bed. I do think possibly taking a sample right around the margin of the wound could be beneficial for her as far as identifying anything such as an inflammatory process or to be honest even a skin tag cancer type process that may be of concern here. Fortunately there does not appear to be any evidence of active infection at this time which is great news she is not having any pain also great news. 08/11/2021 upon evaluation today patient appears to be doing well with regard to her wound. The good news is I did review her biopsy results and it showed some inflammatory mixed findings but  nothing that appeared to be malignant which is great news. Overall this is more of a chronic venous stasis type issue which again is more what we have been treating. Nonetheless I just wanted to make sure before going forward that there was not anything more untoward going on at this point. 08/18/2021 upon evaluation today patient appears to be doing well with regard to her ankle ulcer. Fortunately there does not appear to be any signs of active infection at this time which is great overall wound is dramatically improved compared to last week. Since last week I have actually placed her on doxycycline and subsequently this is a good option as far as the findings are concerned at this point. 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 the doxycycline is doing that. There does not appear to be any evidence of active infection systemically which is great news. 08/25/2021 upon evaluation today patient appears to be doing well with regard to her wound. I feel like we are  finally get back on track as far as healing is concerned I am much happier with the overall appearance today. I do think that she is tolerating the dressing changes without complication which is great news. We have been using Hydrofera Blue which I think is a good option. The good news is she is also doing great in regard to her compression sock on the left which is a zipper compression that seems to be doing a great job keeping her edema under good control. 09/01/2021 upon evaluation today patient appears to be doing well with regard to her wound. Infection seems to be under much better control which is great news and very pleased in that regard. Fortunately there does not appear to be any signs of infection currently. TANETTA, FUHRIMAN ([id number]) 09/08/2021 upon evaluation today patient actually appears to be making good progress in regard to her wound. She has been tolerating the dressing changes without complication. Fortunately there does not appear to be any evidence of active infection at this time which is great news as well. No fevers, chills, nausea, vomiting, or diarrhea. 09/22/2021 upon evaluation today patient appears to be doing well with regard to her wound although is very slow to heal. We have not looked into Apligraf yet I think that is something that we should see about doing. Electronic Signature(s) Signed: 09/22/2021 5:36:56 PM By: Worthy Keeler PA-C Entered By: Worthy Keeler on 09/22/2021 17:36:56 [name], [name] ([id number]) -------------------------------------------------------------------------------- Physical Exam Details Patient Name: [name] Date of Service: 09/22/2021 10:15 AM Medical Record Number: [id number] Patient Account Number: [id number] Date of Birth/Sex: [birth year] (87 y.o. F) Treating RN: Carlene Coria Primary Care Provider: Adrian Prows Other Clinician: Referring Provider: Adrian Prows Treating Provider/Extender: Skipper Cliche in Treatment: 73 Constitutional Well-nourished and well-hydrated in no acute distress. Respiratory normal breathing without difficulty. Psychiatric this patient is able to make decisions and demonstrates good insight into disease process. Alert and Oriented x 3. pleasant and cooperative. Notes Upon inspection patient's wound bed actually showed signs of good granulation and epithelization at this point. Fortunately there does not appear to be any evidence of infection systemically which is great news. No fevers, chills, nausea, vomiting, or diarrhea. Electronic Signature(s) Signed: 09/22/2021 5:37:14 PM By: Worthy Keeler PA-C Entered By: Worthy Keeler on 09/22/2021 17:37:13 [name], [name] ([id number]) --------------------------------------------------------------------------------  Physician Orders Details Patient Name: YSIDRA, SOPHER. Date of Service: 09/22/2021 10:15 AM Medical Record Number: [id number] Patient Account Number: [id number] Date of Birth/Sex: Feb 08, 1923 (87 y.o. 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 Bathing/ Shower/ Hygiene o May shower with wound dressing protected with water repellent cover or cast protector. Edema Control - Lymphedema / Segmental Compressive Device / Other o Optional: One layer of unna paste to top of compression wrap (to act as an anchor). - and at toes o Elevate, Exercise Daily and Avoid Standing for Long Periods of  Time. o Elevate legs to the level of the heart and pump ankles as often as possible o Elevate leg(s) parallel to the floor when sitting. Wound Treatment Wound #7 - Malleolus Wound Laterality: Right, Lateral Cleanser: Soap and Water 1 x Per Week/30 Days Discharge Instructions: Gently cleanse wound with antibacterial soap, rinse and pat dry prior to dressing wounds Primary Dressing: Prisma 4.34 (in) 1 x Per Week/30 Days Discharge Instructions: Moisten w/normal saline or sterile water; Cover wound as directed. Do not remove from wound bed. Secondary Dressing: ABD Pad 5x9 (in/in) 1 x Per Week/30 Days Discharge Instructions: Cover with ABD pad Compression Wrap: Profore Lite LF 3 Multilayer Compression Bandaging System 1 x Per Week/30 Days Discharge Instructions: Apply 3 multi-layer wrap as prescribed. Electronic Signature(s) Signed: 09/23/2021 4:19:44 PM By: Carlene Coria RN Signed: 09/23/2021 4:28:58 PM By: Worthy Keeler PA-C Entered By: Carlene Coria on 09/22/2021 10:53:55 [name], [name] ([id number]) -------------------------------------------------------------------------------- Problem List Details Patient Name: [name] Date of Service: 09/22/2021 10:15 AM Medical Record Number: [id number] Patient Account Number: [id number] Date of Birth/Sex: February 14, 1923 (87 y.o. F) Treating RN: Carlene Coria Primary Care Provider: Adrian Prows Other Clinician: Referring Provider: Adrian Prows Treating Provider/Extender: Skipper Cliche in Treatment: 18 Active Problems ICD-10 Encounter Code Description Active Date MDM Diagnosis 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, stage 3 05/13/2021 No Yes I89.0 Lymphedema, not elsewhere classified 05/13/2021 No Yes I87.2 Venous insufficiency (chronic) (peripheral) 05/13/2021 No Yes Z95.0 Presence of cardiac pacemaker 05/13/2021 No Yes Z79.01 Long term (current) use of anticoagulants 05/13/2021 No Yes Inactive Problems Resolved  Problems Electronic Signature(s) Signed: 09/22/2021 10:45:47 AM By: Worthy Keeler PA-C Entered By: Worthy Keeler on 09/22/2021 10:45:47 [name], [name] ([id number]) -------------------------------------------------------------------------------- Progress Note Details Patient Name: [name] Date of Service: 09/22/2021 10:15 AM Medical Record Number: [id number] Patient Account Number: [id number] Date of Birth/Sex: 1923-10-02 (87 y.o. 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 Dr. Amalia Hailey the podiatrist saw her on 07/20/2017 and sent her to the wound center for possible hyperbaric oxygen therapy. past medical history of peripheral vascular disease, varicose veins, status post appendectomy, basal cell carcinoma excision from the left leg, cholecystectomy, pacemaker placement, right lower extremity angiography  done by Dr. dew in March 2017 with placement of a stent. there is also note of a successful ablation of the right small saphenous vein done which was reviewed by ultrasound on 10/24/2016. the patient had a right small saphenous vein ablation done on 10/20/2016. The patient has never been a smoker. She has been seen by Dr. Corene Cornea dew the vascular surgeon who most recently saw her on 06/15/2017 for evaluation of ongoing problems with right leg swelling. She had a lower extremity arterial duplex examination done(02/13/17) which showed patent distal right superficial femoral artery stent and above-the-knee popliteal stent without  evidence of restenosis. The ABI was more than 1.3 on the right and more than 1.3 on the left. This was consistent with noncompressible arteries due to medial calcification. The right great toe pressure and PPG waveforms are within normal limits and the left great toe pressure and PPG waveforms are decreased. he recommended she continue to wear her compression stockings and continue with elevation. She is scheduled to have a noninvasive arterial study in the near future 08/16/2017 -- had a lower extremity arterial duplex examination done which showed patent distal right superficial femoral artery stent and above-the-knee popliteal stent without evidence of restenosis. The ABI was more than 1.3 on the right and more than 1.3 on the left. This was consistent with noncompressible arteries due to medial calcification. The right great toe pressure and PPG waveforms are within normal limits and the left great toe pressure and PPG waveforms are decreased. the x-ray of the right ankle has not yet been done 08/24/2017 -- had a right ankle x-ray -- IMPRESSION:1. No fracture, bone lesion or evidence of osteomyelitis. 2. Lateral soft tissue swelling with a soft tissue ulcer. she has not yet seen the vascular surgeon for review 08/31/17 on evaluation today patient's wound appears to be showing signs of improvement. She still with her appointment with vascular in order to review her results of her vascular study and then determine if any intervention would be recommended at that time. No fevers, chills, nausea, or vomiting noted at this time. She has been tolerating the dressing changes without complication. 09/28/17 on evaluation today patient's wound appears to show signs of good improvement in regard to the granulation tissue which is surfacing. There is still a layer of slough covering the wound and the posterior portion is still significantly deeper than the anterior nonetheless there has been some good sign  of things moving towards the better. She is going to go back to Dr. dew for reevaluation to ensure her blood flow is still appropriate. That will be before her next evaluation with [place] next week. No fevers, chills, nausea, or vomiting noted at this time. Patient does have some discomfort rated to be a 3-4/10 depending on activity specifically cleansing the wound makes it worse. 10/05/2017 -- the patient was seen by Dr. Lucky Cowboy last week and noninvasive studies showed a normal right ABI with brisk triphasic waveforms consistent with no arterial insufficiency including normal digital pressures. The duplex showed a patent distal right SFA stent and the proximal SFA was also normal. He was pleased with her test and thought she should have enough of perfusion for normal wound healing. He would see her back in 6 months time. 12/21/17 on evaluation today patient appears to be doing fairly well in regard to her right lateral ankle wound. Unfortunately the main issue that she is expansion at this point is that she is having some issues with what appears to  be some cellulitis in the right anterior shin. She has also been noting a little bit of uncomfortable feeling especially last night and her ankle area. I'm afraid that she made the developing a little bit of an infection. With that being said I think it is in the early stages. 12/28/17 on evaluation today patient's ankle appears to be doing excellent. She's making good progress at this point the cellulitis seems to have improved after last week's evaluation. Overall she is having no significant discomfort which is excellent news. She does have an appointment with Dr. dew on March 29, 2018 for reevaluation in regard to the stent he placed. She seems to have excellent blood flow in the right lower extremity. 01/19/12 on evaluation today patient's wound appears to be doing very well. In fact she does not appear to require debridement at this point, there's no evidence  of infection, and overall from the standpoint of the wound she seems to be doing very well. With that being said I believe that it may be time to switch to different dressing away from the [facility] Dressing she tells me she does have a lot going on her friend actually passed away yesterday and she's also having a lot of issues with her husband this obviously is weighing heavy on her as far as your thoughts and concerns today. 01/25/18 on evaluation today patient appears to be doing fairly well in regard to her right lateral malleolus. She has been tolerating the dressing changes without complication. Overall I feel like this is definitely showing signs of improvement as far as how the overall appearance of the wound is there's also evidence of epithelium start to migrate over the granulation tissue. In general I think that she is progressing nicely as far as the wound is concerned. The only concern she really has is whether or not we can switch to every other week visits in order to avoid having as many MARIESHA, VENTURELLA ([id number]) appointments as her daughters have a difficult time getting her to her appointments as well as the patient's husband to his he is not doing very well at this point. 02/22/18 on evaluation today patient's right lateral malleolus ulcer appears to be doing great. She has been tolerating the dressing changes without complication. Overall you making excellent progress at this time. Patient is having no significant discomfort. 03/15/18 on evaluation today patient appears to be doing much more poorly in regard to her right lateral ankle ulcer at this point. Unfortunately since have last seen her her husband has passed just a few days ago is obviously weighed heavily on her her daughter also had surgery well she is with her today as usual. There does not appear to be any evidence of infection she does seem to have significant contusion/deep tissue injury to the right lateral  malleolus which was not noted previous when I saw her last. It's hard to tell of exactly when this injury occurred although during the time she was spending the night in the hospital this may have been most likely. 03/22/18 on evaluation today patient appears to actually be doing very well in regard to her ulcer. She did unfortunately have a setback which was noted last week however the good news is we seem to be getting back on track and in fact the wound in the core did still have some necrotic tissue which will be addressed at this point today but in general I'm seeing signs that things are on the up  and up. She is glad to hear this obviously she's been somewhat concerned that due to the how her wound digressed more recently. 03/29/18 on evaluation today patient appears to be doing fairly well in regard to her right lower extremity lateral malleolus ulcer. She unfortunately does have a new area of pressure injury over the inferior portion where the wound has opened up a little bit larger secondary to the pressure she seems to be getting. She does tell me sometimes when she sleeps at night that it actually hurts and does seem to be pushing on the area little bit more unfortunately. There does not appear to be any evidence of infection which is good news. She has been tolerating the dressing changes without complication. She also did have some bruising in the left second and third toes due to the fact that she may have bump this or injured it although she has neuropathy so she does not feel she did move recently that may have been where this came from. Nonetheless there does not appear to be any evidence of infection at this time. 04/12/18 on evaluation today patient's wound on the right lateral ankle actually appears to be doing a little bit better with a lot of necrotic docking tissue centrally loosening up in clearing away. However she does have the beginnings of a deep tissue injury on the left  lateral malleolus likely due to the fact we've been trying offload the right as much as we have. I think she may benefit from an assistive soft device to help with offloading and it looks like they're looking at one of the doughnut conditions that wraps around the lower leg to offload which I think will definitely do a good job. With that being said I think we definitely need to address this issue on the left before it becomes a wound. Patient is not having significant pain. 04/19/18 on evaluation today patient appears to be doing excellent in regard to the progress she's made with her right lateral ankle ulcer. The left ankle region which did show evidence of a deep tissue injury seems to be resolving there's little fluid noted underneath and a blister there's nothing open at this point in time overall I feel like this is progressing nicely which is good news. She does not seem to be having significant discomfort at this point which is also good news. 04/25/18-She is here in follow up evaluation for bilateral lateral malleolar ulcers. The right lateral malleolus ulcer with pale subcutaneous tissue exposure, central area of ulcer with tendon/periosteum exposed. The left lateral malleolus ulcer now with central area of nonviable tissue, otherwise deep tissue injury. She is wearing compression wraps to the left lower extremity, she will place the right lower extremity compression wraps on when she gets home. She will be out of town over the weekend and return next week and follow-up appointment. She completed her doxycycline this morning 05/03/18 on evaluation today patient appears to be doing very well in regard to her right lateral ankle ulcer in general. At least she's showing some signs of improvement in this regard. Unfortunately she has some additional injury to the left lateral malleolus region which appears to be new likely even over the past several days. Again this determination is based on the  overall appearance. With that being said the patient is obviously frustrated about this currently. 05/10/18-She is here in follow-up evaluation for bilateral lateral malleolar ulcers. She states she has purchased offloading shoes/boots and they will arrive  tomorrow. She was asked to bring them in the office at next week's appointment so her provider is aware of product being utilized. She continues to sleep on right or left side, she has been encouraged to sleep on her back. The right lateral malleolus ulcer is precariously close to peri-osteum; will order xray. The left lateral malleolus ulcer is improved. Will switch back to santyl; she will follow up next week. 05/17/18 on evaluation today patient actually appears to be doing very well in regard to her malleolus her ulcers compared to last time I saw them. She does not seem to have as much in the way of contusion at this point which is great news. With that being said she does continue to have discomfort and I do believe that she is still continuing to benefit from the offloading/pressure reducing boots that were recommended. I think this is the key to trying to get this to heal up completely. 05/24/18 on evaluation today patient actually appears to be doing worse at this point in time unfortunately compared to her last week's evaluation. She is having really no increased pain which is good news unfortunately she does have more maceration in your theme and noted surrounding the right lateral ankle the left lateral ankle is not really is erythematous I do not see signs of the overt cellulitis on that side. Unfortunately the wounds do not seem to have shown any signs of improvement since the last evaluation. She also has significant swelling especially on the right compared to previous some of this may be due to infection however also think that she may be served better while she has these wounds by compression wrapping versus continuing to use the  Juxta-Lite for the time being. Especially with the amount of drainage that she is experiencing at this point. No fevers, chills, nausea, or vomiting noted at this time. 05/31/18 on evaluation today patient appears to actually be doing better in regard to her right lateral lower extremity ulcer specifically on the malleolus region. She has been tolerating the antibiotic without complication. With that being said she still continues to have issues but a little bit of redness although nothing like she what she was experiencing previous. She still continues to pressure to her ankle area she did get the problem on offloading boots unfortunately she will not wear them she states there too uncomfortable and she can't get in and out of the bed. Nonetheless at this point her wounds seem to be continually getting worse which is not what we want I'm getting somewhat concerned about her progress and how things are going to proceed if we do not intervene in some way shape or form. I therefore had a very lengthy conversation today about offloading yet again and even made a specific suggestion for switching her to a memory foam mattress and even gave the information for a specific one that they could look at getting if it was something that they were interested in considering. She does not want to be considered for a hospital bed air mattress although honestly insurance would not cover it that she does not have any wounds on her trunk. 06/14/18 on evaluation today both wounds over the bilateral lateral malleolus her ulcers appear to be doing better there's no evidence of pressure injury at this point. She did get the foam mattress for her bed and this does seem to have been extremely beneficial for her in my pinion. Her daughter states that she is having difficulty getting  out of bed because of how soft it is. The patient also relates this to be. Nonetheless I do feel like she's actually doing better. Unfortunately  right after and around the time she was getting the mattress she also sustained a fall when she got up to go pick up the phone and ended up injuring her right elbow she has 18 sutures in place. We are not caring for this currently although home health is going to be taking the sutures out shortly. Nonetheless this may be something that we need to evaluate going forward. It depends on SHANECA, ORNE ([id number]) how well it has or has not healed in the end. She also recently saw an orthopedic specialist for an injection in the right shoulder just before her fall unfortunately the fall seems to have worsened her pain. 06/21/18 on evaluation today patient appears to be doing about the same in regard to her lateral malleolus ulcers. Both appear to be just a little bit deeper but again we are clinging away the necrotic and dead tissue which I think is why this is progressing towards a deeper realm as opposed to improving from my measurement standpoint in that regard. Nonetheless she has been tolerating the dressing changes she absolutely hates the memory foam mattress topper that was obtained for her nonetheless I do believe this is still doing excellent as far as taking care of excess pressure in regard to the lateral malleolus regions. She in fact has no pressure injury that I see whereas in weeks past it was week by week I was constantly seeing new pressure injuries. Overall I think it has been very beneficial for her. 07/03/18; patient arrives in my clinic today. She has deep punched out areas over her bilateral lateral malleoli. The area on the right has some more depth. We spent a lot of time today talking about pressure relief for these areas. This started when her daughter asked for a prescription for a memory foam mattress. I have never written a prescription for a mattress and I don't think insurances would pay for that on an ordinary bed. In any case he came up that she has foam boots that  she refuses to wear. I would suggest going to these before any other offloading issues when she is in bed. They say she is meticulous about offloading this the rest of the day 07/10/18- She is seen in follow-up evaluation for bilateral, lateral malleolus ulcers. There is no improvement in the ulcers. She has purchased and is sleeping on a memory foam mattress/overlay, she has been using the offloading boots nightly over the past week. She has a follow up appointment with vascular medicine at the end of October, in my opinion this follow up should be expedited given her deterioration and suboptimal TBI results. We will order plain film xray of the left ankle as deeper structures are palpable; would consider having MRI, regardless of xray report(s). The ulcers will be treated with iodoflex/iodosorb, she is unable to safely change the dressings daily with santyl. 07/19/18 on evaluation today patient appears to be doing in general visually well in regard to her bilateral lateral malleolus ulcers. She has been tolerating the dressing changes without complication which is good news. With that being said we did have an x-ray performed on 07/12/18 which revealed a slight loosen see in the lateral portion of the distal left fibula which may represent artifact but underline lytic destruction or osteomyelitis could not be excluded. MRI was recommended. With  that being said we can see about getting the patient scheduled for an MRI to further evaluate this area. In fact we have that scheduled currently for August 20 19,019. 07/26/18 on evaluation today patient's wound on the right lateral ankle actually appears to be doing fairly well at this point in my pinion. She has made some good progress currently. With that being said unfortunately in regard to the left lateral ankle ulcer this seems to be a little bit more problematic at this time. In fact as I further evaluated the situation she actually had bone exposed which  is the first time that's been the case in the bone appear to be necrotic. Currently I did review patient's note from Dr. Bunnie Domino office with [facility]. He stated that ABI was 1.26 on the right and 0.95 on the left with good waveforms. Her perfusion is stable not reduced from previous studies and her digital waveforms were pretty good particularly on the right. His conclusion upon review of the note was that there was not much she could do to improve her perfusion and he felt she was adequate for wound healing. His suggestion was that she continued to see [place] and consider a synthetic skin graft if there was no underlying infection. He plans to see her back in six months or as needed. 08/01/18 on evaluation today patient appears to be doing better in regard to her right lateral ankle ulcer. Her left lateral ankle ulcer is about the same she still has bone involvement in evidence of necrosis. There does not appear to be evidence of infection at this time On the right lateral lower extremity. I have started her on the Augmentin she picked this up and started this yesterday. This is to get her through until she sees infectious disease which is scheduled for 08/12/18. 08/06/18 on evaluation today patient appears to be doing rather well considering my discussion with patient's daughter at the end of last week. The area which was marked where she had erythema seems to be improved and this is good news. With that being said overall the patient seems to be making good improvement when it comes to the overall appearance of the right lateral ankle ulcer although this has been slow she at least is coming around in this regard. Unfortunately in regard to the left lateral ankle ulcer this is osteomyelitis based on the pathology report as well is bone culture. Nonetheless we are still waiting CT scan. Unfortunately the MRI we originally ordered cannot be performed as the patient is a pacemaker which  I had overlooked. Nonetheless we are working on the CT scan approval and scheduling as of now. She did go to the hospital over the weekend and was placed on IV Cefzo for a couple of days. Fortunately this seems to have improved the erythema quite significantly which is good news. There does not appear to be any evidence of worsening infection at this time. She did have some bleeding after the last debridement therefore I did not perform any sharp debridement in regard to left lateral ankle at this point. Patient has been approved for a snap vac for the right lateral ankle. 08/14/18; the patient with wounds over her bilateral lateral malleoli. The area on the right actually looks quite good. Been using a snap back on this area. Healthy granulation and appears to be filling in. Unfortunately the area on the left is really problematic. 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 Also noted to have cellulitis. She saw Dr. Novella Olive of infectious disease today and was put on linezolid. We are able to verify this with her pharmacy. She is completed the Augmentin that she was already on. We've been using Iodoflex to this area 08/23/18 on evaluation today patient's wounds both actually appear to be doing better compared to my prior evaluations. Fortunately she showing signs of good improvement in regard to the overall wound status especially where were using the snap vac on the right. In regard to left lateral malleolus the wound bed actually appears to be much cleaner than previously noted. I do not feel any phone directly probed during evaluation today and though there is tendon noted this does not appear to be necrotic it's actually fairly good as far as the overall appearance of the tendon is concerned. In general the wound bed actually appears to be doing significantly better than it was previous. Patient is currently in the care  of [name] and I did review that note today. He actually has her on two weeks of linezolid and then following the patient will be on 1-2 months of Keflex. That is the plan currently. She has been on antibiotics therapy as prescribed by myself initially starting on July 30, 2018 and has been on that continuously up to this point. 08/30/18 on evaluation today patient actually appears to be doing much better in regard to her right lateral malleolus ulcer. She has been tolerating the dressing changes specifically the snap vac without complication although she did have some issues with the seal currently. Apparently there was some trouble with getting it to maintain over the past week past Sunday. Nonetheless overall the wound appears better in regard to the right lateral malleolus region. In regard to left lateral malleolus this actually show some signs of additional granulation although there still tendon noted in the base of the wound this appears to be healthy not necrotic in any way whatsoever. We are considering potentially using a snap vac for the left lateral malleolus as well the product wrap from [employer], [place], was present in the clinic today we're going to see this patient I did have her come in with me after obtaining consent from the patient and her daughter in order to look at the wound and see if there's any recommendation one way or another as to whether or not they felt the snapback could be beneficial for the left lateral malleolus region. But [name], [name] ([id number]) the conclusion was that it might be but that this is definitely a little bit deeper wound than what traditionally would be utilized for a snap vac. 09/06/18 on evaluation today patient actually appears to be doing excellent in my pinion in regard to both ankle ulcers. She has been tolerating the dressing changes without complication which is great news. Specifically we have been using the snap vac. In regard to the  right ankle I'm not even sure that this is going to be necessary for today and following as the wound has filled in quite nicely. In regard to the left ankle I do believe that we're seeing excellent epithelialization from the edge as well as granulation in the central portion the tendon is still exposed but there's no evidence of necrotic bone and in general I feel like the patient has made excellent progress even compared to last week with just one week of the snap vac. 09/11/18;  this is a patient who has wounds on her bilateral lateral malleoli. Initially both of these were deep stage IV wounds in the setting of chronic arterial insufficiency. She has been revascularized. As I understand think she been using snap vacs to both of these wounds however the area on the right became more superficial and currently she is only using it on the left. Using silver collagen on the right and silver collagen under the back on the left I believe 09/19/18 on evaluation today patient actually appears to be doing very well in regard to her lateral malleolus or ulcers bilaterally. She has been tolerating the dressing changes without complication. Fortunately there does not appear to be any evidence of infection at this time. Overall I feel like she is improving in an excellent manner and I'm very pleased with the fact that everything seems to be turning towards the better for her. This has obviously been a long road. 09/27/18 on evaluation today patient actually appears to be doing very well in regard to her bilateral lateral malleolus ulcers. She has been tolerating the dressing changes without complication. Fortunately there does not appear to be any evidence of infection at this time which is also great news. No fevers, chills, nausea, or vomiting noted at this time. Overall I feel like she is doing excellent with the snap vac on the left malleolus. She had 40 mL of fluid collection over the past week. 10/04/18 on  evaluation today patient actually appears to be doing well in regard to her bilateral lateral malleolus ulcers. She continues to tolerate the dressing changes without complication. One issue that I see is the snap vac on the left lateral malleolus which appears to have sealed off some fluid underlying this area and has not really allowed it to heal to the degree that I would like to see. For that reason I did suggest at this point we may want to pack a small piece of packing strip into this region to allow it to more effectively wick out fluid. 10/11/18 in general the patient today does not feel that she has been doing very well. She's been a little bit lethargic and subsequently is having bodyaches as well according to what she tells me today. With that being said overall she has been concerned with the fact that something may be worsening although to be honest her wounds really have not been appearing poorly. She does have a new ulcer on her left heel unfortunately. This may be pressure related. Nonetheless it seems to me to have potentially started at least as a blister I do not see any evidence of deep tissue injury. In regard to the left ankle the snap vac still seems to be causing the ceiling off of the deeper part of the wound which is in turn trapping fluid. I'm not extremely pleased with the overall appearance as far as progress from last week to this week therefore I'm gonna discontinue the snap vac at this point. 10/18/18 patient unfortunately this point has not been feeling well for the past several days. She was seen by Grayland Ormond her primary care provider who is a Librarian, academic at [facility]. Subsequently she states that she's been very weak and generally feeling malaise. No fevers, chills, nausea, or vomiting noted at this time. With that being said bloodwork was performed at the PCP office on the 11th of this month which showed a white blood cell count of 10.7. This was  repeated today and  shows a white blood cell count of 12.4. This does show signs of worsening. Coupled with the fact that she is feeling worse and that her left ankle wound is not really showing signs of improvement I feel like this is an indication that the osteomyelitis is likely exacerbating not improving. Overall I think we may also want to check her C-reactive protein and sedimentation rate. Actually did call [facility] office this afternoon while the patient was in the office here with me. Subsequently based on the findings we discussed treatment possibilities and I think that it is appropriate for [place] to go ahead and initiate treatment with doxycycline which I'm going to do. Subsequently he did agree to see about adding a CRP and sedimentation rate to her orders. If that has not already been drawn to where they can run it they will contact the patient she can come back to have that check. They are in agreement with plan as far as the patient and her daughter are concerned. Nonetheless also think we need to get in touch with Dr. Henreitta Leber office to see about getting the patient scheduled with him as soon as possible. 11/08/18 on evaluation today patient presents for follow-up concerning her bilateral foot and ankle ulcers. I did do an extensive review of her chart in epic today. Subsequently she was seen by [name] he did initiate Cefepime IV antibiotic therapy. Subsequently she had some issues with her PICC line this had to be removed because it was coiled and then replaced. Fortunately that was now settled. Unfortunately she has continued have issues with her left heel as well as the issues that she is experiencing with her bilateral lateral malleolus regions. I do believe however both areas seem to be doing a little bit better on evaluation today which is good news. No fevers, chills, nausea, or vomiting noted at this time. She actually has an angiogram schedule with Dr. dew on this coming  Monday, November 11, 2018. Subsequently the patient states that she is feeling much better especially than what she was roughly 2 weeks ago. She actually had to cancel an appointment because she was feeling so poorly. No fevers, chills, nausea, or vomiting noted at this time. 11/15/18 on evaluation today patient actually is status post having had her angiogram with Dr. dew Monday, four days ago. 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 He had to go and work on several areas of the vasculature fortunately he was able to obtain no more than a 30% residual stenosis throughout post procedure. I reviewed this note today. I think this will definitely help with healing at this time. Fortunately there does not appear to be any signs of infection and I do feel like ratio already has a better appearance to it. 11/22/18 upon evaluation today patient actually appears to be doing very well in regard to her wounds in general. The right lateral malleolus looks excellent the heel looks better in the left lateral malleolus also appears to be doing a little better. With that being said the right second toe actually appears to be open and training we been watching this is been dry and stable but now is open. 12/03/2018 Seen today for follow-up and management of multiple bilateral lower extremity wounds. New pressure injury of the great toe which is closed at this time. Wound of the right distal second toe appears larger today with deep undermining and a pocket of fluid present within the  undermining region. Left and right malleolus is wounds are stable today with no signs and symptoms of infection.Denies any needs or concerns during exam today. 12/13/18 on evaluation today patient appears to be doing somewhat better in regard to her left heel ulcer. She also seems to be completely healed in regard to the right lateral malleolus ulcer. The left malleolus ulcer is smaller what unfortunately the  wounds which are new over the first and second toes of the right foot are what are most concerning at this point especially the second. Both areas did require sharp debridement today. 12/20/18 on evaluation today patient's wound actually appears to be doing better in regard to left lateral ankle and her right lateral ankle continues to remain healed. The hill ulcer on the left is improved. She does have improvement noted as well in regard to both toe ulcers. Overall I'm very pleased in this regard. No fevers, chills, nausea, or vomiting noted at this time. [name], [name] ([id number]) 12/23/18 on evaluation today patient is seen after she had her toenails trimmed at the podiatrist office due to issues with her right great toe. There was what appeared to be dark eschar on the surface of the wound which had her in the podiatrist concerned. Nonetheless as I remember that during the last office visit I had utilize silver nitrate of this area I was much less concerned about the situation. Subsequently I was able to clean off much of this tissue without any complication today. This does not appear to show any signs of infection and actually look somewhat better compared to last time post debridement. Her second toe on the right foot actually had callous over and there did appear still be some fluid underneath this that would require debridement today. 12/27/18 on evaluation today patient actually appears to be showing signs of improvement at all locations. Even the left lateral ankle although this is not quite as great as the other sites. Fortunately there does not appear to be any signs of infection at this time and both of her toes on the right foot seem to be showing signs of improvement which is good news and very pleased in this regard. 01/03/19 on evaluation today patient appears to be doing better for the most part in regard to her wounds in particular. There does not appear to be any evidence of  infection at this time which is good news. Fortunately there is no sign of really worsening anywhere except for the right great toe which she does have what appears to be a bruise/deep tissue injury which is very superficial and already resolving. I'm not sure where this came from I questioned her extensively and she does not recall what may have happened with this. Other than that the patient seems to be doing well even the left lateral ankle ulcer looks good and is getting smaller. 01/10/19 on evaluation today patient appears to be doing well in regard to her left heel wound and both of her toe wounds. Overall I feel like there is definitely improvement here and I'm happy in that regard. With that being said unfortunately she is having issues with the left lateral malleolus ulcer which unfortunately still has a lot of depth to it. This is gonna be a very difficult wound for [place] to be able to truly get to heal. I may want to consider some type of skin substitute to see if this would be of benefit for her. I'll discuss this with her more the  next visit most likely. This was something I thought about more at the end of the visit when I was Artie out of the room and the patient had been discharged. 01/17/19 on evaluation today patient appears to be doing very well in regard to her wounds in general. She's been making excellent progress at this time. Fortunately there's no sign of infection at this time either. No fevers, chills, nausea, or vomiting noted at this time. The biggest issue is still her left lateral malleolus where it appears to be doing well and is getting smaller but still shows a small corner where this is deeper and goes down into what appears to be the joint space. Nonetheless this is taking much longer to heal although it still looks better in smaller than previous evaluations. 01/24/19 on evaluation today patient's wounds actually appear to be doing rather well in general overall. She did  require some sharp debridement in regard to the right great toe but everything else appears to be doing excellent no debridement was even necessary. No fevers, chills, nausea, or vomiting noted at this time. 01/31/19 on evaluation today patient actually appears to be doing much better in regard to her left foot wound on the heel as well as the ankle. The right great toe appears to be a little bit worse today this had callous over and trapped a lot of fluid underneath. Fortunately there's no signs of infection at any site which is great news. 02/07/19 on evaluation today patient actually appears to be doing decently well in regard to all of her ulcers at this point. No sharp debridement was required she is a little bit of hyper granulation in regard to the left lateral ankle as well as the left heel but the hill itself is almost completely healed which is excellent news. Overall been very pleased in this regard. 02/14/19 on evaluation today patient actually appears to be doing very well in regard to her ulcers on the right first toe, left lateral malleolus, and left heel. In fact the heel is almost completely healed at this point. The patient does not show any signs of infection which is good news. Overall very pleased with how things have progressed. 04/18/19 Telehealth Evaluation During the COVID-19 National Emergency: Verbal Consent: Obtained from patient Allergies: reviewed and the active list is current. Medication changes: patient has no current medication changes. COVID-19 Screening: 1. Have you traveled internationally or on a cruise ship in the last 14 dayso No 2. Have you had contact with someone with or under investigation for COVID-19o No 3. Have you had a fever, cough, sore throat, or experiencing shortness of breatho No on evaluation today actually did have a visit with this patient through a telehealth encounter with her home health nurse. Subsequently it was noted that the patient  actually appears to be doing okay in regard to her wounds both the right great toe as well as the left lateral malleolus have shown signs of improvement although this in your theme around the left lateral malleolus there eschar coverings for both locations. The question is whether or not they are actually close and whether or not home health needs to discharge the patient or not. Nonetheless my concern is this point obviously is that without actually seeing her and being able to evaluate this directly I cannot ensure that she is completely healed which is the question that I'm being asked. 04/22/19 on evaluation today patient presents for her first evaluation since last time I  saw her which was actually February 14, 2019. I did do a telehealth visit last week in which point it was questionable whether or not she may be healed and had to bring her in today for confirmation. With that being said she does seem to be doing quite well at this point which is good news. There does not appear to be any drainage in the deed I believe her wounds may be healed. Readmission: 09/04/2019 on evaluation today patient appears to be doing unfortunately somewhat more poorly in regard to her left foot ulcer secondary to a wound that began on 08/21/2019 at least when she first noticed this. Fortunately she has not had any evidence of active infection at this time. Systemically. I also do not necessarily see any evidence of infection at the blister/wound site on the first metatarsal head plantar aspect. This almost appears to be something that may have just rubbed inappropriately causing this to breakdown. They did not want a wait too long to come in to be seen as again she had significant issues in the past with wounds that took quite a while to heal in fact it was close to 2 years. Nonetheless this does not appear to be quite that bad but again we do need to remove some of the necrotic tissue from the surface of the wound to  tell exactly the extent. She does not appear to have any significant arterial disease at this point and again her last ABIs and TBI's are recorded above in the alert section her left ABI was 1.27 with a TBI of 0.72 to the right ABI 1.08 with a TBI of 0.39. Other than this the patient has been doing quite [name], COSTABILE ([id number]) well since I last saw her and that was in May 2020. 09/11/2019 on evaluation today patient appeared to be doing very well with regard to her plantar foot ulcer on the left. In fact this appears to be almost completely healed which is awesome. That is after just 1 week of intervention. With that being said there is no signs of active infection at this time. 09/18/2019 on evaluation today patient actually appears to be doing excellent in fact she is completely healed based on what I am seeing at this point. Fortunately there is no signs of active infection at this time and overall patient is very pleased to hear that this area has healed so quickly. Readmission: 05/13/2021 upon evaluation today this patient presents for reevaluation here in the clinic. This is a wound that actually we previously took care of. She had 1 on the right ankle and the left the left turned out to be be harder due to to heal but nonetheless is doing great at this point as the right that has reopened and it was noted first just several weeks ago with a scab over it and came off in just the past few days. Fortunately there does not appear to be any obvious evidence of significant active infection at this time which is great news. No fevers, chills, nausea, vomiting, or diarrhea. 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 There does not appear to be any signs of this interfering in any way with her wound. She does have swelling we previously had compression socks for her ordered but again it does not look like she wears these on a regular basis by any  means. 05/26/2021 upon evaluation today patient appears to be doing well  with regard to her wound which is actually showing signs of excellent improvement. There does not appear to be any signs of active infection which is great news and overall very pleased with where things stand today. No fevers, chills, nausea, vomiting, or diarrhea. 06/02/2021 upon evaluation today patient's wound actually showing signs of excellent improvement. Fortunately there does not appear to be any signs of active infection which is great news. I think the patient is making good progress with regard to her wounds in general. 06/09/2021 upon evaluation today patient appears to be doing excellent in regard to her wounds currently. Fortunately there does not appear to be any signs of active infection which is great news. No fevers, chills, nausea, vomiting, or diarrhea. Overall extremely pleased with where things stand today. I think the patient is making excellent progress. 06/16/2021 upon evaluation today patient appears to be doing well in regard to her wound. This is going require little bit of debridement today and that was discussed with the patient. Otherwise she seems to be doing quite well and I am actually very pleased with where things stand at this point. No fevers, chills, nausea, vomiting, or diarrhea. 06/23/2021 upon evaluation today patient appears to be doing well with regard to her wounds. She has been tolerating the dressing changes without complication. Fortunately there does not appear to be any evidence of infection and she has not had air in her home which she actually lives at an assisted living that got fixed this morning. With that being said because of that her wrap has been extremely hot and bothersome for her over the past week. 06/30/2021 upon evaluation today patient is actually making excellent progress in regard to her ankle ulcer. She has been tolerating the dressing changes without complication  and overall extremely pleased with where things stand there does not appear to be any evidence of active infection which is great news. No fevers, chills, nausea, vomiting, or diarrhea. 07/07/21 upon evaluation today patients and culture on the right actually appears to be doing quite well. There does not appear to be any signs of infection and overall very pleased with where things stand today. No fevers, chills, nausea, or vomiting noted at this time. 07/14/2021 unfortunately the patient today has some evidence of deep tissue injury and pressure getting to the ankle region. Again I am not exactly sure what is going on here but this is very similar to issues that we have had in the past. I explained to the patient that she needs to be very mindful of exactly what is happening I think sleeping in bed is probably the main issue here although there could be other culprits I am not sure what else would potentially lead to this kind of a problem for her. 07/21/2021 upon evaluation today patient's wound actually showing signs of improvement compared to last week. Fortunately there does not appear to be any signs of active infection which is great news and overall very pleased with where things stand in that regard. With that being said I do believe that she is continuing to show signs of overall of getting better although I think this is still basically about what we were 2 weeks ago due to the worsening and now improvement. 07/28/2021 upon evaluation today patient appears to be doing well with regard to her wound. She does have some slough buildup on the surface of the wound which I would have to manage today. Fortunately there is no sign of active infection  at this time. No fevers, chills, nausea, vomiting, or diarrhea. 08/04/2021 upon evaluation today patient appears to be doing about the same in regard to her wound. To be perfectly honest I am beginning to be a little bit concerned about the overall  appearance of the wound bed. I do think possibly taking a sample right around the margin of the wound could be beneficial for her as far as identifying anything such as an inflammatory process or to be honest even a skin tag cancer type process that may be of concern here. Fortunately there does not appear to be any evidence of active infection at this time which is great news she is not having any pain also great news. 08/11/2021 upon evaluation today patient appears to be doing well with regard to her wound. The good news is I did review her biopsy results and it showed some inflammatory mixed findings but nothing that appeared to be malignant which is great news. Overall this is more of a chronic venous stasis type issue which again is more what we have been treating. Nonetheless I just wanted to make sure before going forward that there was not anything more untoward going on at this point. 08/18/2021 upon evaluation today patient appears to be doing well with regard to her ankle ulcer. Fortunately there does not appear to be any signs of active infection at this time which is great overall wound is dramatically improved compared to last week. Since last week I have actually placed her on doxycycline and subsequently this is a good option as far as the findings are concerned at this point. 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 the doxycycline is doing that. There does not appear to be any evidence of active infection systemically which is great news. 08/25/2021 upon evaluation today patient appears to be doing well with regard to her wound. I feel like we are finally get back on track as far as healing is concerned I am much happier with the overall appearance today. I do think that she is tolerating the dressing changes without complication which is great news. We have  been using Hydrofera Blue which I think is a good option. The good news is she is also doing great in [place], ALEXARAE OLIVA. ([id number]) regard to her compression sock on the left which is a zipper compression that seems to be doing a great job keeping her edema under good control. 09/01/2021 upon evaluation today patient appears to be doing well with regard to her wound. Infection seems to be under much better control which is great news and very pleased in that regard. Fortunately there does not appear to be any signs of infection currently. 09/08/2021 upon evaluation today patient actually appears to be making good progress in regard to her wound. She has been tolerating the dressing changes without complication. Fortunately there does not appear to be any evidence of active infection at this time which is great news as well. No fevers, chills, nausea, vomiting, or diarrhea. 09/22/2021 upon evaluation today patient appears to be doing well with regard to her wound although is very slow to heal. We have not looked into Apligraf yet I think that is something that we should see about doing. Objective Constitutional Well-nourished and well-hydrated in no acute distress. Vitals Time Taken: 10:28 AM, Height: 62 in, Weight: 150 lbs,  BMI: 27.4, Temperature: 98.4 F, Pulse: 78 bpm, Respiratory Rate: 18 breaths/min, Blood Pressure: 133/80 mmHg. Respiratory normal breathing without difficulty. Psychiatric this patient is able to make decisions and demonstrates good insight into disease process. Alert and Oriented x 3. pleasant and cooperative. General Notes: Upon inspection patient's wound bed actually showed signs of good granulation and epithelization at this point. Fortunately there does not appear to be any evidence of infection systemically which is great news. No fevers, chills, nausea, vomiting, or diarrhea. Integumentary (Hair, Skin) Wound #7 status is Open. Original cause of wound was Gradually  Appeared. The date acquired was: 05/12/2021. The wound has been in treatment 18 weeks. The wound is located on the Right,Lateral Malleolus. The wound measures 1cm length x 1cm width x 0.3cm depth; 0.785cm^2 area and 0.236cm^3 volume. There is Fat Layer (Subcutaneous Tissue) exposed. There is no tunneling or undermining noted. There is a medium amount of serosanguineous drainage noted. The wound margin is distinct with the outline attached to the wound base. There is medium (34-66%) red, pink, pale granulation within the wound bed. There is a medium (34-66%) amount of necrotic tissue within the wound bed. Assessment Active Problems ICD-10 Pressure ulcer of right ankle, stage 3 Lymphedema, not elsewhere classified Venous insufficiency (chronic) (peripheral) Presence of cardiac pacemaker Long term (current) use of anticoagulants Procedures Wound #7 Pre-procedure diagnosis of Wound #7 is a Pressure Ulcer located on the Right,Lateral Malleolus . There was a Excisional Skin/Subcutaneous Tissue Debridement with a total area of 1 sq cm performed by Tommie Sams., PA-C. With the following instrument(s): Curette to remove Viable and Non-Viable tissue/material. Material removed includes Subcutaneous Tissue, Skin: Dermis, and Skin: Epidermis. No specimens were taken. A time out was conducted at 10:50, prior to the start of the procedure. A Minimum amount of bleeding was controlled with Pressure. The procedure was tolerated well with a pain level of 0 throughout and a pain level of 0 following the procedure. Post Debridement Measurements: 1cm length x 1cm width x 0.3cm depth; 0.236cm^3 volume. Post debridement Stage noted as Category/Stage III. LACORA, FOLMER ([id number]) Character of Wound/Ulcer Post Debridement is improved. Post procedure Diagnosis Wound #7: Same as Pre-Procedure Plan Follow-up Appointments: Return Appointment in 1 week. Bathing/ Shower/ Hygiene: May shower with wound dressing  protected with water repellent cover or cast protector. Edema Control - Lymphedema / Segmental Compressive Device / Other: Optional: One layer of unna paste to top of compression wrap (to act as an anchor). - and at toes Elevate, Exercise Daily and Avoid Standing for Long Periods of Time. Elevate legs to the level of the heart and pump ankles as often as possible Elevate leg(s) parallel to the floor when sitting. WOUND #7: - Malleolus Wound Laterality: Right, Lateral Cleanser: Soap and Water 1 x Per Week/30 Days Discharge Instructions: Gently cleanse wound with antibacterial soap, rinse and pat dry prior to dressing wounds Primary Dressing: Prisma 4.34 (in) 1 x Per Week/30 Days Discharge Instructions: Moisten w/normal saline or sterile water; Cover wound as directed. Do not remove from wound bed. Secondary Dressing: ABD Pad 5x9 (in/in) 1 x Per Week/30 Days Discharge Instructions: Cover with ABD pad Compression Wrap: Profore Lite LF 3 Multilayer Compression Bandaging System 1 x Per Week/30 Days Discharge Instructions: Apply 3 multi-layer wrap as prescribed. 1. Would recommend currently that we going continue with the wound care measures as before and the patient is in agreement with that plan. This includes the use of the silver collagen although regular look  into Apligraf for the patient we get that approved I will make that switch. 2. I am also going to recommend an ABD pad followed by the 3 layer compression wrap which I think is doing a great job. We will see patient back for reevaluation in 1 week here in the clinic. If anything worsens or changes patient will contact our office for additional recommendations. Electronic Signature(s) Signed: 09/22/2021 5:38:22 PM By: Worthy Keeler PA-C Entered By: Worthy Keeler on 09/22/2021 17:38:22 Metz[place], [name] ([id number]) -------------------------------------------------------------------------------- SuperBill Details Patient Name:  [name] Date of Service: 09/22/2021 Medical Record Number: [id number] Patient Account Number: [id number] Date of Birth/Sex: 1923-02-01 (87 y.o. F) Treating RN: Carlene Coria Primary Care Provider: Adrian Prows Other Clinician: Referring Provider: Adrian Prows Treating Provider/Extender: Skipper Cliche in Treatment: 18 Diagnosis Coding ICD-10 Codes Code Description [phone] Pressure ulcer of right ankle, stage 3 I89.0 Lymphedema, not elsewhere classified I87.2 Venous insufficiency (chronic) (peripheral) Z95.0 Presence of cardiac pacemaker Z79.01 Long term (current) use of anticoagulants Facility Procedures CPT4 Code: [id number] Description: 88110 - DEB SUBQ TISSUE 20 SQ CM/< Modifier: Quantity: 1 CPT4 Code: Description: ICD-10 Diagnosis Description 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, stage 3 Modifier: Quantity: Physician Procedures CPT4 Code: 3159458 Description: 59292 - WC PHYS LEVEL 4 - EST PT Modifier: 25 Quantity: 1 CPT4 Code: Description: ICD-10 Diagnosis Description 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, stage 3 I89.0 Lymphedema, not elsewhere classified I87.2 Venous insufficiency (chronic) (peripheral) Z95.0 Presence of cardiac pacemaker Modifier: Quantity: CPT4 Code: [id number] Description: 81771 - WC PHYS SUBQ TISS 20 SQ CM Modifier: Quantity: 1 CPT4 Code: Description: ICD-10 Diagnosis Description 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, stage 3 Modifier: Quantity: Electronic Signature(s) Signed: 09/22/2021 5:38:47 PM By: Worthy Keeler PA-C Entered By: Worthy Keeler on 09/22/2021 17:38:47

## 2021-09-29 ENCOUNTER — Encounter: Payer: Medicare Other | Admitting: Physician Assistant

## 2021-09-29 ENCOUNTER — Other Ambulatory Visit: Payer: Self-pay

## 2021-09-29 DIAGNOSIS — L89513 Pressure ulcer of right ankle, stage 3: Secondary | ICD-10-CM | POA: Diagnosis not present

## 2021-09-29 NOTE — Progress Notes (Addendum)
[name], [name] ([id number]) Visit Report for 09/29/2021 Chief Complaint Document Details Patient Name: [name], [name]. Date of Service: 09/29/2021 10:15 AM Medical Record Number: [id number] Patient Account Number: [id number] Date of Birth/Sex: 03-27-1923 (87 y.o. F) Treating RN: Carlene Coria Primary Care Provider: Adrian Prows Other Clinician: Referring Provider: Adrian Prows Treating Provider/Extender: Skipper Cliche in Treatment: 19 Information Obtained from: Patient Chief Complaint Right foot ulcer Electronic Signature(s) Signed: 09/29/2021 10:26:56 AM By: Worthy Keeler PA-C Entered By: Worthy Keeler on 09/29/2021 10:26:55 Glassco, [name] ([id number]) -------------------------------------------------------------------------------- Debridement Details Patient Name: [name] Date of Service: 09/29/2021 10:15 AM Medical Record Number: [id number] Patient Account Number: [id number] Date of Birth/Sex: 15-Sep-1923 (87 y.o. F) Treating RN: Carlene Coria Primary Care Provider: Adrian Prows Other Clinician: Referring Provider: Adrian Prows Treating Provider/Extender: Skipper Cliche in Treatment: 19 Debridement Performed for Wound #7 Right,Lateral Malleolus Assessment: Performed By: Physician Tommie Sams., PA-C Debridement Type: Debridement Level of Consciousness (Pre- Awake and Alert procedure): Pre-procedure Verification/Time Out Yes - 10:50 Taken: Start Time: 10:50 Pain Control: Lidocaine 4% Topical Solution Total Area Debrided (L x W): 1 (cm) x 1 (cm) = 1 (cm) Tissue and other material Viable, Non-Viable, Slough, Subcutaneous, Skin: Dermis , Skin: Epidermis, Slough debrided: Level: Skin/Subcutaneous Tissue Debridement Description: Excisional Instrument: Curette Bleeding: Minimum Hemostasis Achieved: Pressure End Time: 10:52 Procedural Pain: 0 Post Procedural Pain: 0 Response to Treatment: Procedure was tolerated  well Level of Consciousness (Post- Awake and Alert procedure): Post Debridement Measurements of Total Wound Length: (cm) 1 Stage: Category/Stage III Width: (cm) 1 Depth: (cm) 0.3 Volume: (cm) 0.236 Character of Wound/Ulcer Post Debridement: Improved Post Procedure Diagnosis Same as Pre-procedure Electronic Signature(s) Signed: 09/29/2021 4:43:43 PM By: Worthy Keeler PA-C Signed: 10/03/2021 6:56:11 PM By: Carlene Coria RN Entered By: Carlene Coria on 09/29/2021 10:52:39 Cowley, [name] ([id number]) -------------------------------------------------------------------------------- HPI Details Patient Name: [name] Date of Service: 09/29/2021 10:15 AM Medical Record Number: [id number] Patient Account Number: [id number] Date of Birth/Sex: Sep 10, 1923 (87 y.o. 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 Dr. Amalia Hailey the podiatrist saw her on 07/20/2017 and sent her to the wound center for possible hyperbaric oxygen therapy. past medical history of peripheral vascular disease, varicose veins, status post appendectomy, basal cell carcinoma excision from the left leg, cholecystectomy, pacemaker placement, right lower extremity angiography done by Dr. dew in March 2017 with placement of a stent. there is also note of a successful ablation of the right small saphenous vein done which was reviewed by ultrasound on 10/24/2016. the patient had a right small saphenous vein ablation done on 10/20/2016. The patient has never been a smoker. She has been seen by Dr. Corene Cornea dew the vascular surgeon who most  recently saw her on 06/15/2017 for evaluation of ongoing problems with right leg swelling. She had a lower extremity arterial duplex examination done(02/13/17) which showed patent distal right superficial femoral artery stent and above-the-knee popliteal stent without evidence of restenosis. The ABI was more than 1.3 on the right and more than 1.3 on the left. This was consistent with noncompressible arteries due to medial calcification. The right great toe pressure and PPG waveforms are within normal limits and the left  great toe pressure and PPG waveforms are decreased. he recommended she continue to wear her compression stockings and continue with elevation. She is scheduled to have a noninvasive arterial study in the near future 08/16/2017 -- had a lower extremity arterial duplex examination done which showed patent distal right superficial femoral artery stent and above-the-knee popliteal stent without evidence of restenosis. The ABI was more than 1.3 on the right and more than 1.3 on the left. This was consistent with noncompressible arteries due to medial calcification. The right great toe pressure and PPG waveforms are within normal limits and the left great toe pressure and PPG waveforms are decreased. the x-ray of the right ankle has not yet been done 08/24/2017 -- had a right ankle x-ray -- IMPRESSION:1. No fracture, bone lesion or evidence of osteomyelitis. 2. Lateral soft tissue swelling with a soft tissue ulcer. she has not yet seen the vascular surgeon for review 08/31/17 on evaluation today patient's wound appears to be showing signs of improvement. She still with her appointment with vascular in order to review her results of her vascular study and then determine if any intervention would be recommended at that time. No fevers, chills, nausea, or vomiting noted at this time. She has been tolerating the dressing changes without complication. 09/28/17 on evaluation today patient's  wound appears to show signs of good improvement in regard to the granulation tissue which is surfacing. There is still a layer of slough covering the wound and the posterior portion is still significantly deeper than the anterior nonetheless there has been some good sign of things moving towards the better. She is going to go back to Dr. dew for reevaluation to ensure her blood flow is still appropriate. That will be before her next evaluation with [place] next week. No fevers, chills, nausea, or vomiting noted at this time. Patient does have some discomfort rated to be a 3-4/10 depending on activity specifically cleansing the wound makes it worse. 10/05/2017 -- the patient was seen by Dr. Lucky Cowboy last week and noninvasive studies showed a normal right ABI with brisk triphasic waveforms consistent with no arterial insufficiency including normal digital pressures. The duplex showed a patent distal right SFA stent and the proximal SFA was also normal. He was pleased with her test and thought she should have enough of perfusion for normal wound healing. He would see her back in 6 months time. 12/21/17 on evaluation today patient appears to be doing fairly well in regard to her right lateral ankle wound. Unfortunately the main issue that she is expansion at this point is that she is having some issues with what appears to be some cellulitis in the right anterior shin. She has also been noting a little bit of uncomfortable feeling especially last night and her ankle area. I'm afraid that she made the developing a little bit of an infection. With that being said I think it is in the early stages. 12/28/17 on evaluation today patient's ankle appears to be doing excellent. She's making good progress at this point the cellulitis seems to have improved after last week's evaluation. Overall she is having no significant discomfort which is excellent news. She does have an appointment with Dr. dew on March 29, 2018 for  reevaluation in regard to the stent he placed. She seems to have excellent blood flow in the right lower extremity. 01/19/12 on evaluation today patient's wound appears to be doing very well. In fact she does not appear to require debridement at this point,  there's no evidence of infection, and overall from the standpoint of the wound she seems to be doing very well. With that being said I believe that it may be time to switch to different dressing away from the [facility] Dressing she tells me she does have a lot going on her friend actually passed away yesterday and she's also having a lot of issues with her husband this obviously is weighing heavy on her as far as your thoughts and concerns today. 01/25/18 on evaluation today patient appears to be doing fairly well in regard to her right lateral malleolus. She has been tolerating the dressing changes without complication. Overall I feel like this is definitely showing signs of improvement as far as how the overall appearance of the wound is there's also evidence of epithelium start to migrate over the granulation tissue. In general I think that she is progressing nicely as far as the wound is concerned. The only concern she really has is whether or not we can switch to every other week visits in order to avoid having as many appointments as her daughters have a difficult time getting her to her appointments as well as the patient's husband to his he is not doing very well at this point. 02/22/18 on evaluation today patient's right lateral malleolus ulcer appears to be doing great. She has been tolerating the dressing changes without complication. Overall you making excellent progress at this time. Patient is having no significant discomfort. EULONDA, ANDALON ([id number]) 03/15/18 on evaluation today patient appears to be doing much more poorly in regard to her right lateral ankle ulcer at this point. Unfortunately since have last seen her her  husband has passed just a few days ago is obviously weighed heavily on her her daughter also had surgery well she is with her today as usual. There does not appear to be any evidence of infection she does seem to have significant contusion/deep tissue injury to the right lateral malleolus which was not noted previous when I saw her last. It's hard to tell of exactly when this injury occurred although during the time she was spending the night in the hospital this may have been most likely. 03/22/18 on evaluation today patient appears to actually be doing very well in regard to her ulcer. She did unfortunately have a setback which was noted last week however the good news is we seem to be getting back on track and in fact the wound in the core did still have some necrotic tissue which will be addressed at this point today but in general I'm seeing signs that things are on the up and up. She is glad to hear this obviously she's been somewhat concerned that due to the how her wound digressed more recently. 03/29/18 on evaluation today patient appears to be doing fairly well in regard to her right lower extremity lateral malleolus ulcer. She unfortunately does have a new area of pressure injury over the inferior portion where the wound has opened up a little bit larger secondary to the pressure she seems to be getting. She does tell me sometimes when she sleeps at night that it actually hurts and does seem to be pushing on the area little bit more unfortunately. There does not appear to be any evidence of infection which is good news. She has been tolerating the dressing changes without complication. She also did have some bruising in the left second and third toes due to the fact that she may have  bump this or injured it although she has neuropathy so she does not feel she did move recently that may have been where this came from. Nonetheless there does not appear to be any evidence of infection at this  time. 04/12/18 on evaluation today patient's wound on the right lateral ankle actually appears to be doing a little bit better with a lot of necrotic docking tissue centrally loosening up in clearing away. However she does have the beginnings of a deep tissue injury on the left lateral malleolus likely due to the fact we've been trying offload the right as much as we have. I think she may benefit from an assistive soft device to help with offloading and it looks like they're looking at one of the doughnut conditions that wraps around the lower leg to offload which I think will definitely do a good job. With that being said I think we definitely need to address this issue on the left before it becomes a wound. Patient is not having significant pain. 04/19/18 on evaluation today patient appears to be doing excellent in regard to the progress she's made with her right lateral ankle ulcer. The left ankle region which did show evidence of a deep tissue injury seems to be resolving there's little fluid noted underneath and a blister there's nothing open at this point in time overall I feel like this is progressing nicely which is good news. She does not seem to be having significant discomfort at this point which is also good news. 04/25/18-She is here in follow up evaluation for bilateral lateral malleolar ulcers. The right lateral malleolus ulcer with pale subcutaneous tissue exposure, central area of ulcer with tendon/periosteum exposed. The left lateral malleolus ulcer now with central area of nonviable tissue, otherwise deep tissue injury. She is wearing compression wraps to the left lower extremity, she will place the right lower extremity compression wraps on when she gets home. She will be out of town over the weekend and return next week and follow-up appointment. She completed her doxycycline this morning 05/03/18 on evaluation today patient appears to be doing very well in regard to her right  lateral ankle ulcer in general. At least she's showing some signs of improvement in this regard. Unfortunately she has some additional injury to the left lateral malleolus region which appears to be new likely even over the past several days. Again this determination is based on the overall appearance. With that being said the patient is obviously frustrated about this currently. 05/10/18-She is here in follow-up evaluation for bilateral lateral malleolar ulcers. She states she has purchased offloading shoes/boots and they will arrive tomorrow. She was asked to bring them in the office at next week's appointment so her provider is aware of product being utilized. She continues to sleep on right or left side, she has been encouraged to sleep on her back. The right lateral malleolus ulcer is precariously close to peri-osteum; will order xray. The left lateral malleolus ulcer is improved. Will switch back to santyl; she will follow up next week. 05/17/18 on evaluation today patient actually appears to be doing very well in regard to her malleolus her ulcers compared to last time I saw them. She does not seem to have as much in the way of contusion at this point which is great news. With that being said she does continue to have discomfort and I do believe that she is still continuing to benefit from the offloading/pressure reducing boots that were recommended. I  think this is the key to trying to get this to heal up completely. 05/24/18 on evaluation today patient actually appears to be doing worse at this point in time unfortunately compared to her last week's evaluation. She is having really no increased pain which is good news unfortunately she does have more maceration in your theme and noted surrounding the right lateral ankle the left lateral ankle is not really is erythematous I do not see signs of the overt cellulitis on that side. Unfortunately the wounds do not seem to have shown any signs of  improvement since the last evaluation. She also has significant swelling especially on the right compared to previous some of this may be due to infection however also think that she may be served better while she has these wounds by compression wrapping versus continuing to use the Juxta-Lite for the time being. Especially with the amount of drainage that she is experiencing at this point. No fevers, chills, nausea, or vomiting noted at this time. 05/31/18 on evaluation today patient appears to actually be doing better in regard to her right lateral lower extremity ulcer specifically on the malleolus region. She has been tolerating the antibiotic without complication. With that being said she still continues to have issues but a little bit of redness although nothing like she what she was experiencing previous. She still continues to pressure to her ankle area she did get the problem on offloading boots unfortunately she will not wear them she states there too uncomfortable and she can't get in and out of the bed. Nonetheless at this point her wounds seem to be continually getting worse which is not what we want I'm getting somewhat concerned about her progress and how things are going to proceed if we do not intervene in some way shape or form. I therefore had a very lengthy conversation today about offloading yet again and even made a specific suggestion for switching her to a memory foam mattress and even gave the information for a specific one that they could look at getting if it was something that they were interested in considering. She does not want to be considered for a hospital bed air mattress although honestly insurance would not cover it that she does not have any wounds on her trunk. 06/14/18 on evaluation today both wounds over the bilateral lateral malleolus her ulcers appear to be doing better there's no evidence of pressure injury at this point. She did get the foam mattress for her  bed and this does seem to have been extremely beneficial for her in my pinion. Her daughter states that she is having difficulty getting out of bed because of how soft it is. The patient also relates this to be. Nonetheless I do feel like she's actually doing better. Unfortunately right after and around the time she was getting the mattress she also sustained a fall when she got up to go pick up the phone and ended up injuring her right elbow she has 18 sut[address] in place. We are not caring for this currently although home health is going to be taking the sutures out shortly. Nonetheless this may be something that we need to evaluate going forward. It depends on how well it has or has not healed in the end. She also recently saw an orthopedic specialist for an injection in the right shoulder just before her fall unfortunately the fall seems to have worsened her pain. 06/21/18 on evaluation today patient appears to be doing about  the same in regard to her lateral malleolus ulcers. Both appear to be just a little bit deeper but again we are clinging away the necrotic and dead tissue which I think is why this is progressing towards a deeper realm as opposed DONYETTA, OGLETREE. ([id number]) to improving from my measurement standpoint in that regard. Nonetheless she has been tolerating the dressing changes she absolutely hates the memory foam mattress topper that was obtained for her nonetheless I do believe this is still doing excellent as far as taking care of excess pressure in regard to the lateral malleolus regions. She in fact has no pressure injury that I see whereas in weeks past it was week by week I was constantly seeing new pressure injuries. Overall I think it has been very beneficial for her. 07/03/18; patient arrives in my clinic today. She has deep punched out areas over her bilateral lateral malleoli. The area on the right has some more depth. We spent a lot of time today talking about pressure  relief for these areas. This started when her daughter asked for a prescription for a memory foam mattress. I have never written a prescription for a mattress and I don't think insurances would pay for that on an ordinary bed. In any case he came up that she has foam boots that she refuses to wear. I would suggest going to these before any other offloading issues when she is in bed. They say she is meticulous about offloading this the rest of the day 07/10/18- She is seen in follow-up evaluation for bilateral, lateral malleolus ulcers. There is no improvement in the ulcers. She has purchased and is sleeping on a memory foam mattress/overlay, she has been using the offloading boots nightly over the past week. She has a follow up appointment with vascular medicine at the end of October, in my opinion this follow up should be expedited given her deterioration and suboptimal TBI results. We will order plain film xray of the left ankle as deeper structures are palpable; would consider having MRI, regardless of xray report(s). The ulcers will be treated with iodoflex/iodosorb, she is unable to safely change the dressings daily with santyl. 07/19/18 on evaluation today patient appears to be doing in general visually well in regard to her bilateral lateral malleolus ulcers. She has been tolerating the dressing changes without complication which is good news. With that being said we did have an x-ray performed on 07/12/18 which revealed a slight loosen see in the lateral portion of the distal left fibula which may represent artifact but underline lytic destruction or osteomyelitis could not be excluded. MRI was recommended. With that being said we can see about getting the patient scheduled for an MRI to further evaluate this area. In fact we have that scheduled currently for August 20 19,019. 07/26/18 on evaluation today patient's wound on the right lateral ankle actually appears to be doing fairly well at this  point in my pinion. She has made some good progress currently. With that being said unfortunately in regard to the left lateral ankle ulcer this seems to be a little bit more problematic at this time. In fact as I further evaluated the situation she actually had bone exposed which is the first time that's been the case in the bone appear to be necrotic. Currently I did review patient's note from Dr. Bunnie Domino office with [facility]. He stated that ABI was 1.26 on the right and 0.95 on the left with good  waveforms. Her perfusion is stable not reduced from previous studies and her digital waveforms were pretty good particularly on the right. His conclusion upon review of the note was that there was not much she could do to improve her perfusion and he felt she was adequate for wound healing. His suggestion was that she continued to see [place] and consider a synthetic skin graft if there was no underlying infection. He plans to see her back in six months or as needed. 08/01/18 on evaluation today patient appears to be doing better in regard to her right lateral ankle ulcer. Her left lateral ankle ulcer is about the same she still has bone involvement in evidence of necrosis. There does not appear to be evidence of infection at this time On the right lateral lower extremity. I have started her on the Augmentin she picked this up and started this yesterday. This is to get her through until she sees infectious disease which is scheduled for 08/12/18. 08/06/18 on evaluation today patient appears to be doing rather well considering my discussion with patient's daughter at the end of last week. The area which was marked where she had erythema seems to be improved and this is good news. With that being said overall the patient seems to be making good improvement when it comes to the overall appearance of the right lateral ankle ulcer although this has been slow she at least is coming around in this  regard. Unfortunately in regard to the left lateral ankle ulcer this is osteomyelitis based on the pathology report as well is bone culture. Nonetheless we are still waiting CT scan. Unfortunately the MRI we originally ordered cannot be performed as the patient is a pacemaker which I had overlooked. Nonetheless we are working on the CT scan approval and scheduling as of now. She did go to the hospital over the weekend and was placed on IV Cefzo for a couple of days. Fortunately this seems to have improved the erythema quite significantly which is good news. There does not appear to be any evidence of worsening infection at this time. She did have some bleeding after the last debridement therefore I did not perform any sharp debridement in regard to left lateral ankle at this point. Patient has been approved for a snap vac for the right lateral ankle. 08/14/18; the patient with wounds over her bilateral lateral malleoli. The area on the right actually looks quite good. Been using a snap back on this area. Healthy granulation and appears to be filling in. Unfortunately the area on the left is really problematic. 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 Also noted to have cellulitis. She saw Dr. Novella Olive of infectious disease today and was put on linezolid. We are able to verify this with her pharmacy. She is completed the Augmentin that she was already on. We've been using Iodoflex to this area 08/23/18 on evaluation today patient's wounds both actually appear to be doing better compared to my prior evaluations. Fortunately she showing signs of good improvement in regard to the overall wound status especially where were using the snap vac on the right. In regard to left lateral malleolus the wound bed actually appears to be much cleaner than previously noted. I do not feel any phone directly probed during evaluation today  and though there is tendon noted this does not appear to be necrotic it's actually fairly good as far as  the overall appearance of the tendon is concerned. In general the wound bed actually appears to be doing significantly better than it was previous. Patient is currently in the care of [name] and I did review that note today. He actually has her on two weeks of linezolid and then following the patient will be on 1-2 months of Keflex. That is the plan currently. She has been on antibiotics therapy as prescribed by myself initially starting on July 30, 2018 and has been on that continuously up to this point. 08/30/18 on evaluation today patient actually appears to be doing much better in regard to her right lateral malleolus ulcer. She has been tolerating the dressing changes specifically the snap vac without complication although she did have some issues with the seal currently. Apparently there was some trouble with getting it to maintain over the past week past Sunday. Nonetheless overall the wound appears better in regard to the right lateral malleolus region. In regard to left lateral malleolus this actually show some signs of additional granulation although there still tendon noted in the base of the wound this appears to be healthy not necrotic in any way whatsoever. We are considering potentially using a snap vac for the left lateral malleolus as well the product wrap from [employer], [place], was present in the clinic today we're going to see this patient I did have her come in with me after obtaining consent from the patient and her daughter in order to look at the wound and see if there's any recommendation one way or another as to whether or not they felt the snapback could be beneficial for the left lateral malleolus region. But the conclusion was that it might be but that this is definitely a little bit deeper wound than what traditionally would be utilized for a snap vac. 09/06/18 on  evaluation today patient actually appears to be doing excellent in my pinion in regard to both ankle ulcers. She has been tolerating the dressing changes without complication which is great news. Specifically we have been using the snap vac. In regard to the right ankle I'm not even sure that this is going to be necessary for today and following as the wound has filled in quite nicely. In regard to the left ankle I do believe DANAJA, LASOTA ([id number]) that we're seeing excellent epithelialization from the edge as well as granulation in the central portion the tendon is still exposed but there's no evidence of necrotic bone and in general I feel like the patient has made excellent progress even compared to last week with just one week of the snap vac. 09/11/18; this is a patient who has wounds on her bilateral lateral malleoli. Initially both of these were deep stage IV wounds in the setting of chronic arterial insufficiency. She has been revascularized. As I understand think she been using snap vacs to both of these wounds however the area on the right became more superficial and currently she is only using it on the left. Using silver collagen on the right and silver collagen under the back on the left I believe 09/19/18 on evaluation today patient actually appears to be doing very well in regard to her lateral malleolus or ulcers bilaterally. She has been tolerating the dressing changes without complication. Fortunately there does not appear to be any evidence of infection at this time. Overall I feel like she is improving in an excellent manner and I'm very pleased with the fact that everything seems to  be turning towards the better for her. This has obviously been a long road. 09/27/18 on evaluation today patient actually appears to be doing very well in regard to her bilateral lateral malleolus ulcers. She has been tolerating the dressing changes without complication. Fortunately there does  not appear to be any evidence of infection at this time which is also great news. No fevers, chills, nausea, or vomiting noted at this time. Overall I feel like she is doing excellent with the snap vac on the left malleolus. She had 40 mL of fluid collection over the past week. 10/04/18 on evaluation today patient actually appears to be doing well in regard to her bilateral lateral malleolus ulcers. She continues to tolerate the dressing changes without complication. One issue that I see is the snap vac on the left lateral malleolus which appears to have sealed off some fluid underlying this area and has not really allowed it to heal to the degree that I would like to see. For that reason I did suggest at this point we may want to pack a small piece of packing strip into this region to allow it to more effectively wick out fluid. 10/11/18 in general the patient today does not feel that she has been doing very well. She's been a little bit lethargic and subsequently is having bodyaches as well according to what she tells me today. With that being said overall she has been concerned with the fact that something may be worsening although to be honest her wounds really have not been appearing poorly. She does have a new ulcer on her left heel unfortunately. This may be pressure related. Nonetheless it seems to me to have potentially started at least as a blister I do not see any evidence of deep tissue injury. In regard to the left ankle the snap vac still seems to be causing the ceiling off of the deeper part of the wound which is in turn trapping fluid. I'm not extremely pleased with the overall appearance as far as progress from last week to this week therefore I'm gonna discontinue the snap vac at this point. 10/18/18 patient unfortunately this point has not been feeling well for the past several days. She was seen by Grayland Ormond her primary care provider who is a Librarian, academic at [facility]. Subsequently she states that she's been very weak and generally feeling malaise. No fevers, chills, nausea, or vomiting noted at this time. With that being said bloodwork was performed at the PCP office on the 11th of this month which showed a white blood cell count of 10.7. This was repeated today and shows a white blood cell count of 12.4. This does show signs of worsening. Coupled with the fact that she is feeling worse and that her left ankle wound is not really showing signs of improvement I feel like this is an indication that the osteomyelitis is likely exacerbating not improving. Overall I think we may also want to check her C-reactive protein and sedimentation rate. Actually did call [facility] office this afternoon while the patient was in the office here with me. Subsequently based on the findings we discussed treatment possibilities and I think that it is appropriate for [place] to go ahead and initiate treatment with doxycycline which I'm going to do. Subsequently he did agree to see about adding a CRP and sedimentation rate to her orders. If that has not already been drawn to where they can run it they will contact the  patient she can come back to have that check. They are in agreement with plan as far as the patient and her daughter are concerned. Nonetheless also think we need to get in touch with Dr. Henreitta Leber office to see about getting the patient scheduled with him as soon as possible. 11/08/18 on evaluation today patient presents for follow-up concerning her bilateral foot and ankle ulcers. I did do an extensive review of her chart in epic today. Subsequently she was seen by [name] he did initiate Cefepime IV antibiotic therapy. Subsequently she had some issues with her PICC line this had to be removed because it was coiled and then replaced. Fortunately that was now settled. Unfortunately she has continued have issues with her left heel as well as the issues that she is  experiencing with her bilateral lateral malleolus regions. I do believe however both areas seem to be doing a little bit better on evaluation today which is good news. No fevers, chills, nausea, or vomiting noted at this time. She actually has an angiogram schedule with Dr. dew on this coming Monday, November 11, 2018. Subsequently the patient states that she is feeling much better especially than what she was roughly 2 weeks ago. She actually had to cancel an appointment because she was feeling so poorly. No fevers, chills, nausea, or vomiting noted at this time. 11/15/18 on evaluation today patient actually is status post having had her angiogram with Dr. dew Monday, four days ago. 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 He had to go and work on several areas of the vasculature fortunately he was able to obtain no more than a 30% residual stenosis throughout post procedure. I reviewed this note today. I think this will definitely help with healing at this time. Fortunately there does not appear to be any signs of infection and I do feel like ratio already has a better appearance to it. 11/22/18 upon evaluation today patient actually appears to be doing very well in regard to her wounds in general. The right lateral malleolus looks excellent the heel looks better in the left lateral malleolus also appears to be doing a little better. With that being said the right second toe actually appears to be open and training we been watching this is been dry and stable but now is open. 12/03/2018 Seen today for follow-up and management of multiple bilateral lower extremity wounds. New pressure injury of the great toe which is closed at this time. Wound of the right distal second toe appears larger today with deep undermining and a pocket of fluid present within the undermining region. Left and right malleolus is wounds are stable today with no signs and symptoms of infection.Denies  any needs or concerns during exam today. 12/13/18 on evaluation today patient appears to be doing somewhat better in regard to her left heel ulcer. She also seems to be completely healed in regard to the right lateral malleolus ulcer. The left malleolus ulcer is smaller what unfortunately the wounds which are new over the first and second toes of the right foot are what are most concerning at this point especially the second. Both areas did require sharp debridement today. 12/20/18 on evaluation today patient's wound actually appears to be doing better in regard to left lateral ankle and her right lateral ankle continues to remain healed. The hill ulcer on the left is improved. She does have improvement noted as well in regard to both toe ulcers.  Overall I'm very pleased in this regard. No fevers, chills, nausea, or vomiting noted at this time. 12/23/18 on evaluation today patient is seen after she had her toenails trimmed at the podiatrist office due to issues with her right great toe. There was what appeared to be dark eschar on the surface of the wound which had her in the podiatrist concerned. Nonetheless as I remember that during the last office visit I had utilize silver nitrate of this area I was much less concerned about the situation. Subsequently I was able to clean off much of this tissue without any complication today. This does not appear to show any signs of infection and actually look somewhat better Lolli, [name] ([id number]) compared to last time post debridement. Her second toe on the right foot actually had callous over and there did appear still be some fluid underneath this that would require debridement today. 12/27/18 on evaluation today patient actually appears to be showing signs of improvement at all locations. Even the left lateral ankle although this is not quite as great as the other sites. Fortunately there does not appear to be any signs of infection at this time and  both of her toes on the right foot seem to be showing signs of improvement which is good news and very pleased in this regard. 01/03/19 on evaluation today patient appears to be doing better for the most part in regard to her wounds in particular. There does not appear to be any evidence of infection at this time which is good news. Fortunately there is no sign of really worsening anywhere except for the right great toe which she does have what appears to be a bruise/deep tissue injury which is very superficial and already resolving. I'm not sure where this came from I questioned her extensively and she does not recall what may have happened with this. Other than that the patient seems to be doing well even the left lateral ankle ulcer looks good and is getting smaller. 01/10/19 on evaluation today patient appears to be doing well in regard to her left heel wound and both of her toe wounds. Overall I feel like there is definitely improvement here and I'm happy in that regard. With that being said unfortunately she is having issues with the left lateral malleolus ulcer which unfortunately still has a lot of depth to it. This is gonna be a very difficult wound for [place] to be able to truly get to heal. I may want to consider some type of skin substitute to see if this would be of benefit for her. I'll discuss this with her more the next visit most likely. This was something I thought about more at the end of the visit when I was Artie out of the room and the patient had been discharged. 01/17/19 on evaluation today patient appears to be doing very well in regard to her wounds in general. She's been making excellent progress at this time. Fortunately there's no sign of infection at this time either. No fevers, chills, nausea, or vomiting noted at this time. The biggest issue is still her left lateral malleolus where it appears to be doing well and is getting smaller but still shows a small corner where this is  deeper and goes down into what appears to be the joint space. Nonetheless this is taking much longer to heal although it still looks better in smaller than previous evaluations. 01/24/19 on evaluation today patient's wounds actually appear to be doing  rather well in general overall. She did require some sharp debridement in regard to the right great toe but everything else appears to be doing excellent no debridement was even necessary. No fevers, chills, nausea, or vomiting noted at this time. 01/31/19 on evaluation today patient actually appears to be doing much better in regard to her left foot wound on the heel as well as the ankle. The right great toe appears to be a little bit worse today this had callous over and trapped a lot of fluid underneath. Fortunately there's no signs of infection at any site which is great news. 02/07/19 on evaluation today patient actually appears to be doing decently well in regard to all of her ulcers at this point. No sharp debridement was required she is a little bit of hyper granulation in regard to the left lateral ankle as well as the left heel but the hill itself is almost completely healed which is excellent news. Overall been very pleased in this regard. 02/14/19 on evaluation today patient actually appears to be doing very well in regard to her ulcers on the right first toe, left lateral malleolus, and left heel. In fact the heel is almost completely healed at this point. The patient does not show any signs of infection which is good news. Overall very pleased with how things have progressed. 04/18/19 Telehealth Evaluation During the COVID-19 National Emergency: Verbal Consent: Obtained from patient Allergies: reviewed and the active list is current. Medication changes: patient has no current medication changes. COVID-19 Screening: 1. Have you traveled internationally or on a cruise ship in the last 14 dayso No 2. Have you had contact with someone with or  under investigation for COVID-19o No 3. Have you had a fever, cough, sore throat, or experiencing shortness of breatho No on evaluation today actually did have a visit with this patient through a telehealth encounter with her home health nurse. Subsequently it was noted that the patient actually appears to be doing okay in regard to her wounds both the right great toe as well as the left lateral malleolus have shown signs of improvement although this in your theme around the left lateral malleolus there eschar coverings for both locations. The question is whether or not they are actually close and whether or not home health needs to discharge the patient or not. Nonetheless my concern is this point obviously is that without actually seeing her and being able to evaluate this directly I cannot ensure that she is completely healed which is the question that I'm being asked. 04/22/19 on evaluation today patient presents for her first evaluation since last time I saw her which was actually February 14, 2019. I did do a telehealth visit last week in which point it was questionable whether or not she may be healed and had to bring her in today for confirmation. With that being said she does seem to be doing quite well at this point which is good news. There does not appear to be any drainage in the deed I believe her wounds may be healed. Readmission: 09/04/2019 on evaluation today patient appears to be doing unfortunately somewhat more poorly in regard to her left foot ulcer secondary to a wound that began on 08/21/2019 at least when she first noticed this. Fortunately she has not had any evidence of active infection at this time. Systemically. I also do not necessarily see any evidence of infection at the blister/wound site on the first metatarsal head plantar aspect. This almost  appears to be something that may have just rubbed inappropriately causing this to breakdown. They did not want a wait too long to  come in to be seen as again she had significant issues in the past with wounds that took quite a while to heal in fact it was close to 2 years. Nonetheless this does not appear to be quite that bad but again we do need to remove some of the necrotic tissue from the surface of the wound to tell exactly the extent. She does not appear to have any significant arterial disease at this point and again her last ABIs and TBI's are recorded above in the alert section her left ABI was 1.27 with a TBI of 0.72 to the right ABI 1.08 with a TBI of 0.39. Other than this the patient has been doing quite well since I last saw her and that was in May 2020. 09/11/2019 on evaluation today patient appeared to be doing very well with regard to her plantar foot ulcer on the left. In fact this appears to be almost completely healed which is awesome. That is after just 1 week of intervention. With that being said there is no signs of active infection at this time. MERARY, GARGUILO ([id number]) 09/18/2019 on evaluation today patient actually appears to be doing excellent in fact she is completely healed based on what I am seeing at this point. Fortunately there is no signs of active infection at this time and overall patient is very pleased to hear that this area has healed so quickly. Readmission: 05/13/2021 upon evaluation today this patient presents for reevaluation here in the clinic. This is a wound that actually we previously took care of. She had 1 on the right ankle and the left the left turned out to be be harder due to to heal but nonetheless is doing great at this point as the right that has reopened and it was noted first just several weeks ago with a scab over it and came off in just the past few days. Fortunately there does not appear to be any obvious evidence of significant active infection at this time which is great news. No fevers, chills, nausea, vomiting, or diarrhea. The patient does have a history of  pacemaker along with being on Eliquis currently as well. There does not appear to be any signs of this interfering in any way with her wound. She does have swelling we previously had compression socks for her ordered but again it does not look like she wears these on a regular basis by any means. 05/26/2021 upon evaluation today patient appears to be doing well with regard to her wound which is actually showing signs of excellent improvement. There does not appear to be any signs of active infection which is great news and overall very pleased with where things stand today. No fevers, chills, nausea, vomiting, or diarrhea. 06/02/2021 upon evaluation today patient's wound actually showing signs of excellent improvement. Fortunately there does not appear to be any signs of active infection which is great news. I think the patient is making good progress with regard to her wounds in general. 06/09/2021 upon evaluation today patient appears to be doing excellent in regard to her wounds currently. Fortunately there does not appear to be any signs of active infection which is great news. No fevers, chills, nausea, vomiting, or diarrhea. Overall extremely pleased with where things stand today. I think the patient is making excellent progress. 06/16/2021 upon evaluation today patient appears  to be doing well in regard to her wound. This is going require little bit of debridement today and that was discussed with the patient. Otherwise she seems to be doing quite well and I am actually very pleased with where things stand at this point. No fevers, chills, nausea, vomiting, or diarrhea. 06/23/2021 upon evaluation today patient appears to be doing well with regard to her wounds. She has been tolerating the dressing changes without complication. Fortunately there does not appear to be any evidence of infection and she has not had air in her home which she actually lives at an assisted living that got fixed this  morning. With that being said because of that her wrap has been extremely hot and bothersome for her over the past week. 06/30/2021 upon evaluation today patient is actually making excellent progress in regard to her ankle ulcer. She has been tolerating the dressing changes without complication and overall extremely pleased with where things stand there does not appear to be any evidence of active infection which is great news. No fevers, chills, nausea, vomiting, or diarrhea. 07/07/21 upon evaluation today patients and culture on the right actually appears to be doing quite well. There does not appear to be any signs of infection and overall very pleased with where things stand today. No fevers, chills, nausea, or vomiting noted at this time. 07/14/2021 unfortunately the patient today has some evidence of deep tissue injury and pressure getting to the ankle region. Again I am not exactly sure what is going on here but this is very similar to issues that we have had in the past. I explained to the patient that she needs to be very mindful of exactly what is happening I think sleeping in bed is probably the main issue here although there could be other culprits I am not sure what else would potentially lead to this kind of a problem for her. 07/21/2021 upon evaluation today patient's wound actually showing signs of improvement compared to last week. Fortunately there does not appear to be any signs of active infection which is great news and overall very pleased with where things stand in that regard. With that being said I do believe that she is continuing to show signs of overall of getting better although I think this is still basically about what we were 2 weeks ago due to the worsening and now improvement. 07/28/2021 upon evaluation today patient appears to be doing well with regard to her wound. She does have some slough buildup on the surface of the wound which I would have to manage today.  Fortunately there is no sign of active infection at this time. No fevers, chills, nausea, vomiting, or diarrhea. 08/04/2021 upon evaluation today patient appears to be doing about the same in regard to her wound. To be perfectly honest I am beginning to be a little bit concerned about the overall appearance of the wound bed. I do think possibly taking a sample right around the margin of the wound could be beneficial for her as far as identifying anything such as an inflammatory process or to be honest even a skin tag cancer type process that may be of concern here. Fortunately there does not appear to be any evidence of active infection at this time which is great news she is not having any pain also great news. 08/11/2021 upon evaluation today patient appears to be doing well with regard to her wound. The good news is I did review her biopsy results  and it showed some inflammatory mixed findings but nothing that appeared to be malignant which is great news. Overall this is more of a chronic venous stasis type issue which again is more what we have been treating. Nonetheless I just wanted to make sure before going forward that there was not anything more untoward going on at this point. 08/18/2021 upon evaluation today patient appears to be doing well with regard to her ankle ulcer. Fortunately there does not appear to be any signs of active infection at this time which is great overall wound is dramatically improved compared to last week. Since last week I have actually placed her on doxycycline and subsequently this is a good option as far as the findings are concerned at this point. 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 the doxycycline is doing that. There does not appear to be any evidence of active infection systemically which is great news. 08/25/2021 upon evaluation  today patient appears to be doing well with regard to her wound. I feel like we are finally get back on track as far as healing is concerned I am much happier with the overall appearance today. I do think that she is tolerating the dressing changes without complication which is great news. We have been using Hydrofera Blue which I think is a good option. The good news is she is also doing great in regard to her compression sock on the left which is a zipper compression that seems to be doing a great job keeping her edema under good control. 09/01/2021 upon evaluation today patient appears to be doing well with regard to her wound. Infection seems to be under much better control which is great news and very pleased in that regard. Fortunately there does not appear to be any signs of infection currently. MADALYNN, PICKELSIMER ([id number]) 09/08/2021 upon evaluation today patient actually appears to be making good progress in regard to her wound. She has been tolerating the dressing changes without complication. Fortunately there does not appear to be any evidence of active infection at this time which is great news as well. No fevers, chills, nausea, vomiting, or diarrhea. 09/22/2021 upon evaluation today patient appears to be doing well with regard to her wound although is very slow to heal. We have not looked into Apligraf yet I think that is something that we should see about doing. 09/29/2021 upon evaluation today patient's wound is actually showing signs of doing about the same. I am not seeing any evidence of worsening but also no significant evidence of improvement. We did gain approval for the organogenesis products all except for Apligraf as covered by her insurance. With that being said I do think that we can go ahead and proceed with the NuShield if the patient and her family in agreement of the plan I discussed that with him today she does have a 20% coinsurance which we also  discussed. Electronic Signature(s) Signed: 09/29/2021 1:20:07 PM By: Worthy Keeler PA-C Entered By: Worthy Keeler on 09/29/2021 13:20:07 Quadros, [name] ([id number]) -------------------------------------------------------------------------------- Physical Exam Details Patient Name: [name] Date of Service: 09/29/2021 10:15 AM Medical Record Number: [id number] Patient Account Number: [id number] Date of Birth/Sex: 04-08-1923 (87 y.o. F) Treating RN: Carlene Coria Primary Care Provider: Adrian Prows Other Clinician: Referring Provider: Adrian Prows Treating Provider/Extender: Skipper Cliche in Treatment: [phone]  Notes Upon inspection patient's wound bed actually showed signs of good granulation Epithelization at this point in some areas she did have some slough noted there was some callus around the edges of the wound as well. I did perform sharp debridement to clear away the necrotic tissue here. She tolerated this without complication and postdebridement the wound bed appears to be doing much better. Electronic Signature(s) Signed: 09/29/2021 1:20:43 PM By: Worthy Keeler PA-C Entered By: Worthy Keeler on 09/29/2021 13:20:42 Inman, [name] ([id number]) -------------------------------------------------------------------------------- Physician Orders Details Patient Name: [name] Date of Service: 09/29/2021 10:15 AM Medical Record Number: [id number] Patient Account Number: [id number] Date of Birth/Sex: [birth year] (87 y.o. 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 Bathing/ Shower/ Hygiene o May shower with wound dressing protected with water repellent cover or cast protector. Edema Control - Lymphedema / Segmental Compressive Device / Other o Optional: One layer of unna paste to top of compression wrap (to act as an anchor). - and at toes o Elevate, Exercise Daily and Avoid Standing for Long Periods of Time. o Elevate legs to the level of the heart and pump ankles as often as possible o Elevate leg(s) parallel to the floor when sitting. Wound Treatment Wound #7 - Malleolus Wound Laterality: Right, Lateral Cleanser: Soap and Water 1 x Per Week/30 Days Discharge Instructions: Gently cleanse wound with antibacterial soap, rinse and pat dry prior to dressing wounds Primary Dressing: Prisma 4.34 (in) 1 x Per Week/30 Days Discharge Instructions: Moisten w/normal saline or sterile water; Cover wound as directed. Do not remove from wound bed. Secondary Dressing: ABD Pad 5x9 (in/in) 1 x Per Week/30 Days Discharge Instructions: Cover with ABD pad Compression Wrap: Profore Lite LF 3 Multilayer Compression Bandaging System 1 x Per Week/30 Days Discharge Instructions: Apply 3 multi-layer wrap as prescribed. Electronic Signature(s) Signed: 09/29/2021 4:43:43 PM By: Worthy Keeler PA-C Signed: 10/03/2021 6:56:11 PM By: Carlene Coria RN Entered By: Carlene Coria on 09/29/2021 10:57:29 Minella, [name] ([id number]) -------------------------------------------------------------------------------- Problem List Details Patient Name: [name] Date of Service: 09/29/2021 10:15 AM Medical Record Number: [id number] Patient Account Number: [id number] Date of Birth/Sex: [birth year] (87 y.o. 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, stage 3 05/13/2021 No Yes I89.0 Lymphedema, not elsewhere classified 05/13/2021 No Yes I87.2 Venous insufficiency (chronic) (peripheral) 05/13/2021 No Yes Z95.0 Presence of cardiac pacemaker 05/13/2021 No Yes Z79.01 Long term (current) use of anticoagulants 05/13/2021 No Yes Inactive Problems Resolved Problems Electronic Signature(s) Signed: 09/29/2021 10:25:38 AM By: Worthy Keeler PA-C Entered By: Worthy Keeler on 09/29/2021 10:25:37 Gafford, [name] ([id number]) -------------------------------------------------------------------------------- Progress Note Details Patient Name: [name] Date of Service: 09/29/2021 10:15 AM Medical Record Number: [id number] Patient Account Number: [id number] Date of Birth/Sex: 01-17-1923 (87 y.o. 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 Dr. Amalia Hailey the podiatrist saw her on 07/20/2017 and sent her to the wound center for possible hyperbaric oxygen therapy. past medical history of peripheral vascular disease, varicose veins, status post appendectomy, basal cell carcinoma excision from the left leg, cholecystectomy, pacemaker placement, right lower extremity angiography done by Dr. dew in March 2017 with placement of a stent. there is also note of a successful ablation of the right small saphenous vein done which was reviewed  by ultrasound on 10/24/2016. the patient had a right small saphenous vein ablation done on 10/20/2016. The patient has never been a smoker. She has been seen by Dr. Corene Cornea dew the vascular surgeon who most recently saw her on 06/15/2017 for evaluation of ongoing problems with right leg swelling. She had a lower extremity arterial duplex examination done(02/13/17) which showed patent distal right superficial femoral artery stent and above-the-knee popliteal stent without evidence of restenosis. The ABI was more than 1.3 on the right and more than 1.3 on the left. This was consistent with noncompressible arteries due to medial calcification. The right great toe pressure and PPG waveforms are within normal limits and the left great toe pressure and PPG waveforms are decreased. he recommended she continue to wear her compression stockings and continue with elevation. She is scheduled to have a noninvasive arterial study in the near future 08/16/2017 -- had a lower extremity arterial duplex examination done which showed patent distal right superficial femoral artery stent and above-the-knee popliteal stent without evidence of restenosis. The ABI was more than 1.3 on the right and more than 1.3 on the left. This was consistent with noncompressible arteries due to medial calcification. The right great toe pressure and PPG waveforms are within normal limits and the left great toe pressure and PPG waveforms are decreased. the x-ray of the right ankle has not yet been done 08/24/2017 -- had a right ankle x-ray -- IMPRESSION:1. No fracture, bone lesion or evidence of osteomyelitis. 2. Lateral soft tissue swelling with a soft tissue ulcer. she has not yet seen the vascular surgeon for review 08/31/17 on evaluation today patient's wound appears to be showing signs of improvement. She still with her appointment with vascular in order to review her results of her vascular study and then determine if any intervention  would be recommended at that time. No fevers, chills, nausea, or vomiting noted at this time. She has been tolerating the dressing changes without complication. 09/28/17 on evaluation today patient's wound appears to show signs of good improvement in regard to the granulation tissue which is surfacing. There is still a layer of slough covering the wound and the posterior portion is still significantly deeper than the anterior nonetheless there has been some good sign of things moving towards the better. She is going to go back to Dr. dew for reevaluation to ensure her blood flow is still appropriate. That will be before her next evaluation with [place] next week. No fevers, chills, nausea, or vomiting noted at this time. Patient does have some discomfort rated to be a 3-4/10 depending on activity specifically cleansing the wound makes it worse. 10/05/2017 -- the patient was seen by Dr. Lucky Cowboy last week and noninvasive studies showed a normal right ABI with brisk triphasic waveforms consistent with no arterial  insufficiency including normal digital pressures. The duplex showed a patent distal right SFA stent and the proximal SFA was also normal. He was pleased with her test and thought she should have enough of perfusion for normal wound healing. He would see her back in 6 months time. 12/21/17 on evaluation today patient appears to be doing fairly well in regard to her right lateral ankle wound. Unfortunately the main issue that she is expansion at this point is that she is having some issues with what appears to be some cellulitis in the right anterior shin. She has also been noting a little bit of uncomfortable feeling especially last night and her ankle area. I'm afraid that she made the developing a little bit of an infection. With that being said I think it is in the early stages. 12/28/17 on evaluation today patient's ankle appears to be doing excellent. She's making good progress at this point the  cellulitis seems to have improved after last week's evaluation. Overall she is having no significant discomfort which is excellent news. She does have an appointment with Dr. dew on March 29, 2018 for reevaluation in regard to the stent he placed. She seems to have excellent blood flow in the right lower extremity. 01/19/12 on evaluation today patient's wound appears to be doing very well. In fact she does not appear to require debridement at this point, there's no evidence of infection, and overall from the standpoint of the wound she seems to be doing very well. With that being said I believe that it may be time to switch to different dressing away from the [facility] Dressing she tells me she does have a lot going on her friend actually passed away yesterday and she's also having a lot of issues with her husband this obviously is weighing heavy on her as far as your thoughts and concerns today. 01/25/18 on evaluation today patient appears to be doing fairly well in regard to her right lateral malleolus. She has been tolerating the dressing changes without complication. Overall I feel like this is definitely showing signs of improvement as far as how the overall appearance of the wound is there's also evidence of epithelium start to migrate over the granulation tissue. In general I think that she is progressing nicely as far as the wound is concerned. The only concern she really has is whether or not we can switch to every other week visits in order to avoid having as many KRIMSON, MASSMANN ([id number]) appointments as her daughters have a difficult time getting her to her appointments as well as the patient's husband to his he is not doing very well at this point. 02/22/18 on evaluation today patient's right lateral malleolus ulcer appears to be doing great. She has been tolerating the dressing changes without complication. Overall you making excellent progress at this time. Patient is having no  significant discomfort. 03/15/18 on evaluation today patient appears to be doing much more poorly in regard to her right lateral ankle ulcer at this point. Unfortunately since have last seen her her husband has passed just a few days ago is obviously weighed heavily on her her daughter also had surgery well she is with her today as usual. There does not appear to be any evidence of infection she does seem to have significant contusion/deep tissue injury to the right lateral malleolus which was not noted previous when I saw her last. It's hard to tell of exactly when this injury occurred although during the time  she was spending the night in the hospital this may have been most likely. 03/22/18 on evaluation today patient appears to actually be doing very well in regard to her ulcer. She did unfortunately have a setback which was noted last week however the good news is we seem to be getting back on track and in fact the wound in the core did still have some necrotic tissue which will be addressed at this point today but in general I'm seeing signs that things are on the up and up. She is glad to hear this obviously she's been somewhat concerned that due to the how her wound digressed more recently. 03/29/18 on evaluation today patient appears to be doing fairly well in regard to her right lower extremity lateral malleolus ulcer. She unfortunately does have a new area of pressure injury over the inferior portion where the wound has opened up a little bit larger secondary to the pressure she seems to be getting. She does tell me sometimes when she sleeps at night that it actually hurts and does seem to be pushing on the area little bit more unfortunately. There does not appear to be any evidence of infection which is good news. She has been tolerating the dressing changes without complication. She also did have some bruising in the left second and third toes due to the fact that she may have bump this  or injured it although she has neuropathy so she does not feel she did move recently that may have been where this came from. Nonetheless there does not appear to be any evidence of infection at this time. 04/12/18 on evaluation today patient's wound on the right lateral ankle actually appears to be doing a little bit better with a lot of necrotic docking tissue centrally loosening up in clearing away. However she does have the beginnings of a deep tissue injury on the left lateral malleolus likely due to the fact we've been trying offload the right as much as we have. I think she may benefit from an assistive soft device to help with offloading and it looks like they're looking at one of the doughnut conditions that wraps around the lower leg to offload which I think will definitely do a good job. With that being said I think we definitely need to address this issue on the left before it becomes a wound. Patient is not having significant pain. 04/19/18 on evaluation today patient appears to be doing excellent in regard to the progress she's made with her right lateral ankle ulcer. The left ankle region which did show evidence of a deep tissue injury seems to be resolving there's little fluid noted underneath and a blister there's nothing open at this point in time overall I feel like this is progressing nicely which is good news. She does not seem to be having significant discomfort at this point which is also good news. 04/25/18-She is here in follow up evaluation for bilateral lateral malleolar ulcers. The right lateral malleolus ulcer with pale subcutaneous tissue exposure, central area of ulcer with tendon/periosteum exposed. The left lateral malleolus ulcer now with central area of nonviable tissue, otherwise deep tissue injury. She is wearing compression wraps to the left lower extremity, she will place the right lower extremity compression wraps on when she gets home. She will be out of town  over the weekend and return next week and follow-up appointment. She completed her doxycycline this morning 05/03/18 on evaluation today patient appears to be doing very  well in regard to her right lateral ankle ulcer in general. At least she's showing some signs of improvement in this regard. Unfortunately she has some additional injury to the left lateral malleolus region which appears to be new likely even over the past several days. Again this determination is based on the overall appearance. With that being said the patient is obviously frustrated about this currently. 05/10/18-She is here in follow-up evaluation for bilateral lateral malleolar ulcers. She states she has purchased offloading shoes/boots and they will arrive tomorrow. She was asked to bring them in the office at next week's appointment so her provider is aware of product being utilized. She continues to sleep on right or left side, she has been encouraged to sleep on her back. The right lateral malleolus ulcer is precariously close to peri-osteum; will order xray. The left lateral malleolus ulcer is improved. Will switch back to santyl; she will follow up next week. 05/17/18 on evaluation today patient actually appears to be doing very well in regard to her malleolus her ulcers compared to last time I saw them. She does not seem to have as much in the way of contusion at this point which is great news. With that being said she does continue to have discomfort and I do believe that she is still continuing to benefit from the offloading/pressure reducing boots that were recommended. I think this is the key to trying to get this to heal up completely. 05/24/18 on evaluation today patient actually appears to be doing worse at this point in time unfortunately compared to her last week's evaluation. She is having really no increased pain which is good news unfortunately she does have more maceration in your theme and noted surrounding  the right lateral ankle the left lateral ankle is not really is erythematous I do not see signs of the overt cellulitis on that side. Unfortunately the wounds do not seem to have shown any signs of improvement since the last evaluation. She also has significant swelling especially on the right compared to previous some of this may be due to infection however also think that she may be served better while she has these wounds by compression wrapping versus continuing to use the Juxta-Lite for the time being. Especially with the amount of drainage that she is experiencing at this point. No fevers, chills, nausea, or vomiting noted at this time. 05/31/18 on evaluation today patient appears to actually be doing better in regard to her right lateral lower extremity ulcer specifically on the malleolus region. She has been tolerating the antibiotic without complication. With that being said she still continues to have issues but a little bit of redness although nothing like she what she was experiencing previous. She still continues to pressure to her ankle area she did get the problem on offloading boots unfortunately she will not wear them she states there too uncomfortable and she can't get in and out of the bed. Nonetheless at this point her wounds seem to be continually getting worse which is not what we want I'm getting somewhat concerned about her progress and how things are going to proceed if we do not intervene in some way shape or form. I therefore had a very lengthy conversation today about offloading yet again and even made a specific suggestion for switching her to a memory foam mattress and even gave the information for a specific one that they could look at getting if it was something that they were interested in considering.  She does not want to be considered for a hospital bed air mattress although honestly insurance would not cover it that she does not have any wounds on her trunk. 06/14/18  on evaluation today both wounds over the bilateral lateral malleolus her ulcers appear to be doing better there's no evidence of pressure injury at this point. She did get the foam mattress for her bed and this does seem to have been extremely beneficial for her in my pinion. Her daughter states that she is having difficulty getting out of bed because of how soft it is. The patient also relates this to be. Nonetheless I do feel like she's actually doing better. Unfortunately right after and around the time she was getting the mattress she also sustained a fall when she got up to go pick up the phone and ended up injuring her right elbow she has [address] sut[address]. We are not caring for this currently although home health is going to be taking the sutures out shortly. Nonetheless this may be something that we need to evaluate going forward. It depends on CHERISH, RUNDE ([id number]) how well it has or has not healed in the end. She also recently saw an orthopedic specialist for an injection in the right shoulder just before her fall unfortunately the fall seems to have worsened her pain. 06/21/18 on evaluation today patient appears to be doing about the same in regard to her lateral malleolus ulcers. Both appear to be just a little bit deeper but again we are clinging away the necrotic and dead tissue which I think is why this is progressing towards a deeper realm as opposed to improving from my measurement standpoint in that regard. Nonetheless she has been tolerating the dressing changes she absolutely hates the memory foam mattress topper that was obtained for her nonetheless I do believe this is still doing excellent as far as taking care of excess pressure in regard to the lateral malleolus regions. She in fact has no pressure injury that I see whereas in weeks past it was week by week I was constantly seeing new pressure injuries. Overall I think it has been very beneficial for her. 07/03/18;  patient arrives in my clinic today. She has deep punched out areas over her bilateral lateral malleoli. The area on the right has some more depth. We spent a lot of time today talking about pressure relief for these areas. This started when her daughter asked for a prescription for a memory foam mattress. I have never written a prescription for a mattress and I don't think insurances would pay for that on an ordinary bed. In any case he came up that she has foam boots that she refuses to wear. I would suggest going to these before any other offloading issues when she is in bed. They say she is meticulous about offloading this the rest of the day 07/10/18- She is seen in follow-up evaluation for bilateral, lateral malleolus ulcers. There is no improvement in the ulcers. She has purchased and is sleeping on a memory foam mattress/overlay, she has been using the offloading boots nightly over the past week. She has a follow up appointment with vascular medicine at the end of October, in my opinion this follow up should be expedited given her deterioration and suboptimal TBI results. We will order plain film xray of the left ankle as deeper structures are palpable; would consider having MRI, regardless of xray report(s). The ulcers will be treated with iodoflex/iodosorb, she  is unable to safely change the dressings daily with santyl. 07/19/18 on evaluation today patient appears to be doing in general visually well in regard to her bilateral lateral malleolus ulcers. She has been tolerating the dressing changes without complication which is good news. With that being said we did have an x-ray performed on 07/12/18 which revealed a slight loosen see in the lateral portion of the distal left fibula which may represent artifact but underline lytic destruction or osteomyelitis could not be excluded. MRI was recommended. With that being said we can see about getting the patient scheduled for an MRI to further  evaluate this area. In fact we have that scheduled currently for August 20 19,019. 07/26/18 on evaluation today patient's wound on the right lateral ankle actually appears to be doing fairly well at this point in my pinion. She has made some good progress currently. With that being said unfortunately in regard to the left lateral ankle ulcer this seems to be a little bit more problematic at this time. In fact as I further evaluated the situation she actually had bone exposed which is the first time that's been the case in the bone appear to be necrotic. Currently I did review patient's note from Dr. Bunnie Domino office with [facility]. He stated that ABI was 1.26 on the right and 0.95 on the left with good waveforms. Her perfusion is stable not reduced from previous studies and her digital waveforms were pretty good particularly on the right. His conclusion upon review of the note was that there was not much she could do to improve her perfusion and he felt she was adequate for wound healing. His suggestion was that she continued to see [place] and consider a synthetic skin graft if there was no underlying infection. He plans to see her back in six months or as needed. 08/01/18 on evaluation today patient appears to be doing better in regard to her right lateral ankle ulcer. Her left lateral ankle ulcer is about the same she still has bone involvement in evidence of necrosis. There does not appear to be evidence of infection at this time On the right lateral lower extremity. I have started her on the Augmentin she picked this up and started this yesterday. This is to get her through until she sees infectious disease which is scheduled for 08/12/18. 08/06/18 on evaluation today patient appears to be doing rather well considering my discussion with patient's daughter at the end of last week. The area which was marked where she had erythema seems to be improved and this is good news. With that  being said overall the patient seems to be making good improvement when it comes to the overall appearance of the right lateral ankle ulcer although this has been slow she at least is coming around in this regard. Unfortunately in regard to the left lateral ankle ulcer this is osteomyelitis based on the pathology report as well is bone culture. Nonetheless we are still waiting CT scan. Unfortunately the MRI we originally ordered cannot be performed as the patient is a pacemaker which I had overlooked. Nonetheless we are working on the CT scan approval and scheduling as of now. She did go to the hospital over the weekend and was placed on IV Cefzo for a couple of days. Fortunately this seems to have improved the erythema quite significantly which is good news. There does not appear to be any evidence of worsening infection at this time. She did have  some bleeding after the last debridement therefore I did not perform any sharp debridement in regard to left lateral ankle at this point. Patient has been approved for a snap vac for the right lateral ankle. 08/14/18; the patient with wounds over her bilateral lateral malleoli. The area on the right actually looks quite good. Been using a snap back on this area. Healthy granulation and appears to be filling in. Unfortunately the area on the left is really problematic. 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 Also noted to have cellulitis. She saw Dr. Novella Olive of infectious disease today and was put on linezolid. We are able to verify this with her pharmacy. She is completed the Augmentin that she was already on. We've been using Iodoflex to this area 08/23/18 on evaluation today patient's wounds both actually appear to be doing better compared to my prior evaluations. Fortunately she showing signs of good improvement in regard to the overall wound status especially where were  using the snap vac on the right. In regard to left lateral malleolus the wound bed actually appears to be much cleaner than previously noted. I do not feel any phone directly probed during evaluation today and though there is tendon noted this does not appear to be necrotic it's actually fairly good as far as the overall appearance of the tendon is concerned. In general the wound bed actually appears to be doing significantly better than it was previous. Patient is currently in the care of [name] and I did review that note today. He actually has her on two weeks of linezolid and then following the patient will be on 1-2 months of Keflex. That is the plan currently. She has been on antibiotics therapy as prescribed by myself initially starting on July 30, 2018 and has been on that continuously up to this point. 08/30/18 on evaluation today patient actually appears to be doing much better in regard to her right lateral malleolus ulcer. She has been tolerating the dressing changes specifically the snap vac without complication although she did have some issues with the seal currently. Apparently there was some trouble with getting it to maintain over the past week past Sunday. Nonetheless overall the wound appears better in regard to the right lateral malleolus region. In regard to left lateral malleolus this actually show some signs of additional granulation although there still tendon noted in the base of the wound this appears to be healthy not necrotic in any way whatsoever. We are considering potentially using a snap vac for the left lateral malleolus as well the product wrap from [employer], [place], was present in the clinic today we're going to see this patient I did have her come in with me after obtaining consent from the patient and her daughter in order to look at the wound and see if there's any recommendation one way or another as to whether or not they felt the snapback could be beneficial for  the left lateral malleolus region. But LASHANNA, ANGELO ([id number]) the conclusion was that it might be but that this is definitely a little bit deeper wound than what traditionally would be utilized for a snap vac. 09/06/18 on evaluation today patient actually appears to be doing excellent in my pinion in regard to both ankle ulcers. She has been tolerating the dressing changes without complication which is great news. Specifically we have been using the snap vac. In regard to  the right ankle I'm not even sure that this is going to be necessary for today and following as the wound has filled in quite nicely. In regard to the left ankle I do believe that we're seeing excellent epithelialization from the edge as well as granulation in the central portion the tendon is still exposed but there's no evidence of necrotic bone and in general I feel like the patient has made excellent progress even compared to last week with just one week of the snap vac. 09/11/18; this is a patient who has wounds on her bilateral lateral malleoli. Initially both of these were deep stage IV wounds in the setting of chronic arterial insufficiency. She has been revascularized. As I understand think she been using snap vacs to both of these wounds however the area on the right became more superficial and currently she is only using it on the left. Using silver collagen on the right and silver collagen under the back on the left I believe 09/19/18 on evaluation today patient actually appears to be doing very well in regard to her lateral malleolus or ulcers bilaterally. She has been tolerating the dressing changes without complication. Fortunately there does not appear to be any evidence of infection at this time. Overall I feel like she is improving in an excellent manner and I'm very pleased with the fact that everything seems to be turning towards the better for her. This has obviously been a long road. 09/27/18 on  evaluation today patient actually appears to be doing very well in regard to her bilateral lateral malleolus ulcers. She has been tolerating the dressing changes without complication. Fortunately there does not appear to be any evidence of infection at this time which is also great news. No fevers, chills, nausea, or vomiting noted at this time. Overall I feel like she is doing excellent with the snap vac on the left malleolus. She had 40 mL of fluid collection over the past week. 10/04/18 on evaluation today patient actually appears to be doing well in regard to her bilateral lateral malleolus ulcers. She continues to tolerate the dressing changes without complication. One issue that I see is the snap vac on the left lateral malleolus which appears to have sealed off some fluid underlying this area and has not really allowed it to heal to the degree that I would like to see. For that reason I did suggest at this point we may want to pack a small piece of packing strip into this region to allow it to more effectively wick out fluid. 10/11/18 in general the patient today does not feel that she has been doing very well. She's been a little bit lethargic and subsequently is having bodyaches as well according to what she tells me today. With that being said overall she has been concerned with the fact that something may be worsening although to be honest her wounds really have not been appearing poorly. She does have a new ulcer on her left heel unfortunately. This may be pressure related. Nonetheless it seems to me to have potentially started at least as a blister I do not see any evidence of deep tissue injury. In regard to the left ankle the snap vac still seems to be causing the ceiling off of the deeper part of the wound which is in turn trapping fluid. I'm not extremely pleased with the overall appearance as far as progress from last week to this week therefore I'm gonna discontinue the snap vac at  this point. 10/18/18 patient unfortunately this point has not been feeling well for the past several days. She was seen by Grayland Ormond her primary care provider who is a Librarian, academic at [facility]. Subsequently she states that she's been very weak and generally feeling malaise. No fevers, chills, nausea, or vomiting noted at this time. With that being said bloodwork was performed at the PCP office on the 11th of this month which showed a white blood cell count of 10.7. This was repeated today and shows a white blood cell count of 12.4. This does show signs of worsening. Coupled with the fact that she is feeling worse and that her left ankle wound is not really showing signs of improvement I feel like this is an indication that the osteomyelitis is likely exacerbating not improving. Overall I think we may also want to check her C-reactive protein and sedimentation rate. Actually did call [facility] office this afternoon while the patient was in the office here with me. Subsequently based on the findings we discussed treatment possibilities and I think that it is appropriate for [place] to go ahead and initiate treatment with doxycycline which I'm going to do. Subsequently he did agree to see about adding a CRP and sedimentation rate to her orders. If that has not already been drawn to where they can run it they will contact the patient she can come back to have that check. They are in agreement with plan as far as the patient and her daughter are concerned. Nonetheless also think we need to get in touch with Dr. Henreitta Leber office to see about getting the patient scheduled with him as soon as possible. 11/08/18 on evaluation today patient presents for follow-up concerning her bilateral foot and ankle ulcers. I did do an extensive review of her chart in epic today. Subsequently she was seen by [name] he did initiate Cefepime IV antibiotic therapy. Subsequently she had some issues with  her PICC line this had to be removed because it was coiled and then replaced. Fortunately that was now settled. Unfortunately she has continued have issues with her left heel as well as the issues that she is experiencing with her bilateral lateral malleolus regions. I do believe however both areas seem to be doing a little bit better on evaluation today which is good news. No fevers, chills, nausea, or vomiting noted at this time. She actually has an angiogram schedule with Dr. dew on this coming Monday, November 11, 2018. Subsequently the patient states that she is feeling much better especially than what she was roughly 2 weeks ago. She actually had to cancel an appointment because she was feeling so poorly. No fevers, chills, nausea, or vomiting noted at this time. 11/15/18 on evaluation today patient actually is status post having had her angiogram with Dr. dew Monday, four days ago. 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 He had to go and work on several areas of the vasculature fortunately he was able to obtain no more than a 30% residual stenosis throughout post procedure. I reviewed this note today. I think this will definitely help with healing at this time. Fortunately there does not appear to be any signs of infection and I do feel like ratio already has a better appearance to it. 11/22/18 upon evaluation today patient actually appears to be doing very well in regard to her wounds in general. The right lateral malleolus looks excellent the heel looks better  in the left lateral malleolus also appears to be doing a little better. With that being said the right second toe actually appears to be open and training we been watching this is been dry and stable but now is open. 12/03/2018 Seen today for follow-up and management of multiple bilateral lower extremity wounds. New pressure injury of the great toe which is closed at this time. Wound of the right distal  second toe appears larger today with deep undermining and a pocket of fluid present within the undermining region. Left and right malleolus is wounds are stable today with no signs and symptoms of infection.Denies any needs or concerns during exam today. 12/13/18 on evaluation today patient appears to be doing somewhat better in regard to her left heel ulcer. She also seems to be completely healed in regard to the right lateral malleolus ulcer. The left malleolus ulcer is smaller what unfortunately the wounds which are new over the first and second toes of the right foot are what are most concerning at this point especially the second. Both areas did require sharp debridement today. 12/20/18 on evaluation today patient's wound actually appears to be doing better in regard to left lateral ankle and her right lateral ankle continues to remain healed. The hill ulcer on the left is improved. She does have improvement noted as well in regard to both toe ulcers. Overall I'm very pleased in this regard. No fevers, chills, nausea, or vomiting noted at this time. BRIANNIE, GUTIERREZ ([id number]) 12/23/18 on evaluation today patient is seen after she had her toenails trimmed at the podiatrist office due to issues with her right great toe. There was what appeared to be dark eschar on the surface of the wound which had her in the podiatrist concerned. Nonetheless as I remember that during the last office visit I had utilize silver nitrate of this area I was much less concerned about the situation. Subsequently I was able to clean off much of this tissue without any complication today. This does not appear to show any signs of infection and actually look somewhat better compared to last time post debridement. Her second toe on the right foot actually had callous over and there did appear still be some fluid underneath this that would require debridement today. 12/27/18 on evaluation today patient actually appears to  be showing signs of improvement at all locations. Even the left lateral ankle although this is not quite as great as the other sites. Fortunately there does not appear to be any signs of infection at this time and both of her toes on the right foot seem to be showing signs of improvement which is good news and very pleased in this regard. 01/03/19 on evaluation today patient appears to be doing better for the most part in regard to her wounds in particular. There does not appear to be any evidence of infection at this time which is good news. Fortunately there is no sign of really worsening anywhere except for the right great toe which she does have what appears to be a bruise/deep tissue injury which is very superficial and already resolving. I'm not sure where this came from I questioned her extensively and she does not recall what may have happened with this. Other than that the patient seems to be doing well even the left lateral ankle ulcer looks good and is getting smaller. 01/10/19 on evaluation today patient appears to be doing well in regard to her left heel wound and both of  her toe wounds. Overall I feel like there is definitely improvement here and I'm happy in that regard. With that being said unfortunately she is having issues with the left lateral malleolus ulcer which unfortunately still has a lot of depth to it. This is gonna be a very difficult wound for [place] to be able to truly get to heal. I may want to consider some type of skin substitute to see if this would be of benefit for her. I'll discuss this with her more the next visit most likely. This was something I thought about more at the end of the visit when I was Artie out of the room and the patient had been discharged. 01/17/19 on evaluation today patient appears to be doing very well in regard to her wounds in general. She's been making excellent progress at this time. Fortunately there's no sign of infection at this time either. No  fevers, chills, nausea, or vomiting noted at this time. The biggest issue is still her left lateral malleolus where it appears to be doing well and is getting smaller but still shows a small corner where this is deeper and goes down into what appears to be the joint space. Nonetheless this is taking much longer to heal although it still looks better in smaller than previous evaluations. 01/24/19 on evaluation today patient's wounds actually appear to be doing rather well in general overall. She did require some sharp debridement in regard to the right great toe but everything else appears to be doing excellent no debridement was even necessary. No fevers, chills, nausea, or vomiting noted at this time. 01/31/19 on evaluation today patient actually appears to be doing much better in regard to her left foot wound on the heel as well as the ankle. The right great toe appears to be a little bit worse today this had callous over and trapped a lot of fluid underneath. Fortunately there's no signs of infection at any site which is great news. 02/07/19 on evaluation today patient actually appears to be doing decently well in regard to all of her ulcers at this point. No sharp debridement was required she is a little bit of hyper granulation in regard to the left lateral ankle as well as the left heel but the hill itself is almost completely healed which is excellent news. Overall been very pleased in this regard. 02/14/19 on evaluation today patient actually appears to be doing very well in regard to her ulcers on the right first toe, left lateral malleolus, and left heel. In fact the heel is almost completely healed at this point. The patient does not show any signs of infection which is good news. Overall very pleased with how things have progressed. 04/18/19 Telehealth Evaluation During the COVID-19 National Emergency: Verbal Consent: Obtained from patient Allergies: reviewed and the active list is  current. Medication changes: patient has no current medication changes. COVID-19 Screening: 1. Have you traveled internationally or on a cruise ship in the last 14 dayso No 2. Have you had contact with someone with or under investigation for COVID-19o No 3. Have you had a fever, cough, sore throat, or experiencing shortness of breatho No on evaluation today actually did have a visit with this patient through a telehealth encounter with her home health nurse. Subsequently it was noted that the patient actually appears to be doing okay in regard to her wounds both the right great toe as well as the left lateral malleolus have shown signs of improvement although  this in your theme around the left lateral malleolus there eschar coverings for both locations. The question is whether or not they are actually close and whether or not home health needs to discharge the patient or not. Nonetheless my concern is this point obviously is that without actually seeing her and being able to evaluate this directly I cannot ensure that she is completely healed which is the question that I'm being asked. 04/22/19 on evaluation today patient presents for her first evaluation since last time I saw her which was actually February 14, 2019. I did do a telehealth visit last week in which point it was questionable whether or not she may be healed and had to bring her in today for confirmation. With that being said she does seem to be doing quite well at this point which is good news. There does not appear to be any drainage in the deed I believe her wounds may be healed. Readmission: 09/04/2019 on evaluation today patient appears to be doing unfortunately somewhat more poorly in regard to her left foot ulcer secondary to a wound that began on 08/21/2019 at least when she first noticed this. Fortunately she has not had any evidence of active infection at this time. Systemically. I also do not necessarily see any evidence of  infection at the blister/wound site on the first metatarsal head plantar aspect. This almost appears to be something that may have just rubbed inappropriately causing this to breakdown. They did not want a wait too long to come in to be seen as again she had significant issues in the past with wounds that took quite a while to heal in fact it was close to 2 years. Nonetheless this does not appear to be quite that bad but again we do need to remove some of the necrotic tissue from the surface of the wound to tell exactly the extent. She does not appear to have any significant arterial disease at this point and again her last ABIs and TBI's are recorded above in the alert section her left ABI was 1.27 with a TBI of 0.72 to the right ABI 1.08 with a TBI of 0.39. Other than this the patient has been doing quite KESSA, FAIRBAIRN ([id number]) well since I last saw her and that was in May 2020. 09/11/2019 on evaluation today patient appeared to be doing very well with regard to her plantar foot ulcer on the left. In fact this appears to be almost completely healed which is awesome. That is after just 1 week of intervention. With that being said there is no signs of active infection at this time. 09/18/2019 on evaluation today patient actually appears to be doing excellent in fact she is completely healed based on what I am seeing at this point. Fortunately there is no signs of active infection at this time and overall patient is very pleased to hear that this area has healed so quickly. Readmission: 05/13/2021 upon evaluation today this patient presents for reevaluation here in the clinic. This is a wound that actually we previously took care of. She had 1 on the right ankle and the left the left turned out to be be harder due to to heal but nonetheless is doing great at this point as the right that has reopened and it was noted first just several weeks ago with a scab over it and came off in just the past  few days. Fortunately there does not appear to be any obvious evidence of significant  active infection at this time which is great news. No fevers, chills, nausea, vomiting, or diarrhea. 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 There does not appear to be any signs of this interfering in any way with her wound. She does have swelling we previously had compression socks for her ordered but again it does not look like she wears these on a regular basis by any means. 05/26/2021 upon evaluation today patient appears to be doing well with regard to her wound which is actually showing signs of excellent improvement. There does not appear to be any signs of active infection which is great news and overall very pleased with where things stand today. No fevers, chills, nausea, vomiting, or diarrhea. 06/02/2021 upon evaluation today patient's wound actually showing signs of excellent improvement. Fortunately there does not appear to be any signs of active infection which is great news. I think the patient is making good progress with regard to her wounds in general. 06/09/2021 upon evaluation today patient appears to be doing excellent in regard to her wounds currently. Fortunately there does not appear to be any signs of active infection which is great news. No fevers, chills, nausea, vomiting, or diarrhea. Overall extremely pleased with where things stand today. I think the patient is making excellent progress. 06/16/2021 upon evaluation today patient appears to be doing well in regard to her wound. This is going require little bit of debridement today and that was discussed with the patient. Otherwise she seems to be doing quite well and I am actually very pleased with where things stand at this point. No fevers, chills, nausea, vomiting, or diarrhea. 06/23/2021 upon evaluation today patient appears to be doing well with regard to her wounds. She has been  tolerating the dressing changes without complication. Fortunately there does not appear to be any evidence of infection and she has not had air in her home which she actually lives at an assisted living that got fixed this morning. With that being said because of that her wrap has been extremely hot and bothersome for her over the past week. 06/30/2021 upon evaluation today patient is actually making excellent progress in regard to her ankle ulcer. She has been tolerating the dressing changes without complication and overall extremely pleased with where things stand there does not appear to be any evidence of active infection which is great news. No fevers, chills, nausea, vomiting, or diarrhea. 07/07/21 upon evaluation today patients and culture on the right actually appears to be doing quite well. There does not appear to be any signs of infection and overall very pleased with where things stand today. No fevers, chills, nausea, or vomiting noted at this time. 07/14/2021 unfortunately the patient today has some evidence of deep tissue injury and pressure getting to the ankle region. Again I am not exactly sure what is going on here but this is very similar to issues that we have had in the past. I explained to the patient that she needs to be very mindful of exactly what is happening I think sleeping in bed is probably the main issue here although there could be other culprits I am not sure what else would potentially lead to this kind of a problem for her. 07/21/2021 upon evaluation today patient's wound actually showing signs of improvement compared to last week. Fortunately there does not appear to be any signs of active infection which is great news and overall very pleased with  where things stand in that regard. With that being said I do believe that she is continuing to show signs of overall of getting better although I think this is still basically about what we were 2 weeks ago due to the  worsening and now improvement. 07/28/2021 upon evaluation today patient appears to be doing well with regard to her wound. She does have some slough buildup on the surface of the wound which I would have to manage today. Fortunately there is no sign of active infection at this time. No fevers, chills, nausea, vomiting, or diarrhea. 08/04/2021 upon evaluation today patient appears to be doing about the same in regard to her wound. To be perfectly honest I am beginning to be a little bit concerned about the overall appearance of the wound bed. I do think possibly taking a sample right around the margin of the wound could be beneficial for her as far as identifying anything such as an inflammatory process or to be honest even a skin tag cancer type process that may be of concern here. Fortunately there does not appear to be any evidence of active infection at this time which is great news she is not having any pain also great news. 08/11/2021 upon evaluation today patient appears to be doing well with regard to her wound. The good news is I did review her biopsy results and it showed some inflammatory mixed findings but nothing that appeared to be malignant which is great news. Overall this is more of a chronic venous stasis type issue which again is more what we have been treating. Nonetheless I just wanted to make sure before going forward that there was not anything more untoward going on at this point. 08/18/2021 upon evaluation today patient appears to be doing well with regard to her ankle ulcer. Fortunately there does not appear to be any signs of active infection at this time which is great overall wound is dramatically improved compared to last week. Since last week I have actually placed her on doxycycline and subsequently this is a good option as far as the findings are concerned at this point. 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 the doxycycline is doing that. There does not appear to be any evidence of active infection systemically which is great news. 08/25/2021 upon evaluation today patient appears to be doing well with regard to her wound. I feel like we are finally get back on track as far as healing is concerned I am much happier with the overall appearance today. I do think that she is tolerating the dressing changes without complication which is great news. We have been using Hydrofera Blue which I think is a good option. The good news is she is also doing great in [place], SYMIAH NOWOTNY. ([id number]) regard to her compression sock on the left which is a zipper compression that seems to be doing a great job keeping her edema under good control. 09/01/2021 upon evaluation today patient appears to be doing well with regard to her wound. Infection seems to be under much better control which is great news and very pleased in that regard. Fortunately there does not appear to be any signs of infection currently. 09/08/2021 upon evaluation today patient actually appears to be making good progress in regard to her wound. She has been tolerating the dressing changes without  complication. Fortunately there does not appear to be any evidence of active infection at this time which is great news as well. No fevers, chills, nausea, vomiting, or diarrhea. 09/22/2021 upon evaluation today patient appears to be doing well with regard to her wound although is very slow to heal. We have not looked into Apligraf yet I think that is something that we should see about doing. 09/29/2021 upon evaluation today patient's wound is actually showing signs of doing about the same. I am not seeing any evidence of worsening but also no significant evidence of improvement. We did gain approval for the organogenesis products all except for Apligraf as covered by her insurance. With that being  said I do think that we can go ahead and proceed with the NuShield if the patient and her family in agreement of the plan I discussed that with him today she does have a 20% coinsurance which we also discussed. Objective Constitutional Vitals Time Taken: 10:32 AM, Height: 62 in, Weight: 150 lbs, BMI: 27.4, Temperature: 98.2 F, Pulse: 75 bpm, Respiratory Rate: 18 breaths/min, Blood Pressure: 142/82 mmHg. Integumentary (Hair, Skin) Wound #7 status is Open. Original cause of wound was Gradually Appeared. The date acquired was: 05/12/2021. The wound has been in treatment 19 weeks. The wound is located on the Right,Lateral Malleolus. The wound measures 1cm length x 1cm width x 0.3cm depth; 0.785cm^2 area and 0.236cm^3 volume. There is Fat Layer (Subcutaneous Tissue) exposed. There is no tunneling or undermining noted. There is a medium amount of serosanguineous drainage noted. The wound margin is distinct with the outline attached to the wound base. There is medium (34-66%) red, pink, pale granulation within the wound bed. There is a medium (34-66%) amount of necrotic tissue within the wound bed. Assessment Active Problems ICD-10 Pressure ulcer of right ankle, stage 3 Lymphedema, not elsewhere classified Venous insufficiency (chronic) (peripheral) Presence of cardiac pacemaker Long term (current) use of anticoagulants Procedures Wound #7 Pre-procedure diagnosis of Wound #7 is a Pressure Ulcer located on the Right,Lateral Malleolus . There was a Excisional Skin/Subcutaneous Tissue Debridement with a total area of 1 sq cm performed by Tommie Sams., PA-C. With the following instrument(s): Curette to remove Viable and Non-Viable tissue/material. Material removed includes Subcutaneous Tissue, Slough, Skin: Dermis, and Skin: Epidermis after achieving pain control using Lidocaine 4% Topical Solution. No specimens were taken. A time out was conducted at 10:50, prior to the start of the procedure.  A Minimum amount of bleeding was controlled with Pressure. The procedure was tolerated well with a pain level of 0 throughout and a pain level of 0 following the procedure. Post Debridement Measurements: 1cm length x 1cm width x 0.3cm depth; 0.236cm^3 volume. Post debridement Stage noted as Category/Stage III. Character of Wound/Ulcer Post Debridement is improved. Post procedure Diagnosis Wound #7: Same as Pre-Procedure Liliani, Bobo [name] ([id number]) Plan Follow-up Appointments: Return Appointment in 1 week. Bathing/ Shower/ Hygiene: May shower with wound dressing protected with water repellent cover or cast protector. Edema Control - Lymphedema / Segmental Compressive Device / Other: Optional: One layer of unna paste to top of compression wrap (to act as an anchor). - and at toes Elevate, Exercise Daily and Avoid Standing for Long Periods of Time. Elevate legs to the level of the heart and pump ankles as often as possible Elevate leg(s) parallel to the floor when sitting. WOUND #7: - Malleolus Wound Laterality: Right, Lateral Cleanser: Soap and Water 1 x Per Week/30 Days Discharge Instructions: Gently cleanse wound with  antibacterial soap, rinse and pat dry prior to dressing wounds Primary Dressing: Prisma 4.34 (in) 1 x Per Week/30 Days Discharge Instructions: Moisten w/normal saline or sterile water; Cover wound as directed. Do not remove from wound bed. Secondary Dressing: ABD Pad 5x9 (in/in) 1 x Per Week/30 Days Discharge Instructions: Cover with ABD pad Compression Wrap: Profore Lite LF 3 Multilayer Compression Bandaging System 1 x Per Week/30 Days Discharge Instructions: Apply 3 multi-layer wrap as prescribed. 1. I would recommend currently that we going to continue with the wound care measures as before and the patient is in agreement the plan. With that being said we will use of the collagen for now although his family is going to discuss whether or not they want to proceed with  the NuShield which I think would be appropriate as well. If they do then we will see about ordering that to have in place for next week. 2. I am also can recommend that we have the patient continue with a 3 layer compression wrap which is doing a good job keeping the edema under control. We will see patient back for reevaluation in 1 week here in the clinic. If anything worsens or changes patient will contact our office for additional recommendations. Electronic Signature(s) Signed: 09/29/2021 1:22:17 PM By: Worthy Keeler PA-C Entered By: Worthy Keeler on 09/29/2021 13:22:17 Colucci, [name] ([id number]) -------------------------------------------------------------------------------- SuperBill Details Patient Name: [name] Date of Service: 09/29/2021 Medical Record Number: [id number] Patient Account Number: [id number] Date of Birth/Sex: [birth year] (87 y.o. F) Treating RN: Carlene Coria Primary Care Provider: Adrian Prows Other Clinician: Referring Provider: Adrian Prows Treating Provider/Extender: Skipper Cliche in Treatment: 19 Diagnosis Coding ICD-10 Codes Code Description [phone] Pressure ulcer of right ankle, stage 3 I89.0 Lymphedema, not elsewhere classified I87.2 Venous insufficiency (chronic) (peripheral) Z95.0 Presence of cardiac pacemaker Z79.01 Long term (current) use of anticoagulants Facility Procedures CPT4 Code: [id number] Description: 16579 - DEB SUBQ TISSUE 20 SQ CM/< Modifier: Quantity: 1 CPT4 Code: Description: ICD-10 Diagnosis Description 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, stage 3 Modifier: Quantity: Physician Procedures CPT4 Code: [id number] Description: 32919 - WC PHYS SUBQ TISS 20 SQ CM Modifier: Quantity: 1 CPT4 Code: Description: ICD-10 Diagnosis Description 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, stage 3 Modifier: Quantity: Electronic Signature(s) Signed: 09/29/2021 1:22:29 PM By: Worthy Keeler PA-C Entered By:  Worthy Keeler on 09/29/2021 13:22:29

## 2021-09-29 NOTE — Progress Notes (Addendum)
[name], [name] ([id number]) Visit Report for 09/29/2021 Arrival Information Details Patient Name: [name], [name]. Date of Service: 09/29/2021 10:15 AM Medical Record Number: [id number] Patient Account Number: [id number] Date of Birth/Sex: 07-Jan-1923 (87 y.o. F) Treating RN: Carlene Coria Primary Care Josey Forcier: Adrian Prows Other Clinician: Referring Melecio Cueto: Adrian Prows Treating Adewale Pucillo/Extender: Skipper Cliche in Treatment: 24 Visit Information History Since Last Visit All ordered tests and consults were completed: No Patient Arrived: Ambulatory Added or deleted any medications: No Arrival Time: 10:12 Any new allergies or adverse reactions: No Accompanied By: son Had a fall or experienced change in No Transfer Assistance: None activities of daily living that may affect Patient Identification Verified: Yes risk of falls: Secondary Verification Process Completed: Yes Signs or symptoms of abuse/neglect since last visito No Patient Requires Transmission-Based No Hospitalized since last visit: No Precautions: Implantable device outside of the clinic excluding No Patient Has Alerts: Yes cellular tissue based products placed in the center Patient Alerts: Patient on Blood since last visit: Thinner Has Dressing in Place as Prescribed: Yes NOT DIABETIC Has Compression in Place as Prescribed: Yes ***ELIQUIS*** Pain Present Now: No Electronic Signature(s) Signed: 10/03/2021 6:56:11 PM By: Carlene Coria RN Entered By: Carlene Coria on 09/29/2021 10:32:31 Heuerman, [name] ([id number]) -------------------------------------------------------------------------------- Clinic Level of Care Assessment Details Patient Name: [name] Date of Service: 09/29/2021 10:15 AM Medical Record Number: [id number] Patient Account Number: [id number] Date of Birth/Sex: 1923-08-23 (87 y.o. F) Treating RN: Carlene Coria Primary Care Jeston Junkins: Adrian Prows Other  Clinician: Referring Talani Brazee: Adrian Prows Treating Daisuke Bailey/Extender: Skipper Cliche in Treatment: 19 Clinic Level of Care Assessment Items TOOL 1 Quantity Score []  - Use when EandM and Procedure is performed on INITIAL visit 0 ASSESSMENTS - Nursing Assessment / Reassessment []  - General Physical Exam (combine w/ comprehensive assessment (listed just below) when performed on new 0 pt. evals) []  - 0 Comprehensive Assessment (HX, ROS, Risk Assessments, Wounds Hx, etc.) ASSESSMENTS - Wound and Skin Assessment / Reassessment []  - Dermatologic / Skin Assessment (not related to wound area) 0 ASSESSMENTS - Ostomy and/or Continence Assessment and Care []  - Incontinence Assessment and Management 0 []  - 0 Ostomy Care Assessment and Management (repouching, etc.) PROCESS - Coordination of Care []  - Simple Patient / Family Education for ongoing care 0 []  - 0 Complex (extensive) Patient / Family Education for ongoing care []  - 0 Staff obtains Programmer, systems, Records, Test Results / Process Orders []  - 0 Staff telephones HHA, Nursing Homes / Clarify orders / etc []  - 0 Routine Transfer to another Facility (non-emergent condition) []  - 0 Routine Hospital Admission (non-emergent condition) []  - 0 New Admissions / Biomedical engineer / Ordering NPWT, Apligraf, etc. []  - 0 Emergency Hospital Admission (emergent condition) PROCESS - Special Needs []  - Pediatric / Minor Patient Management 0 []  - 0 Isolation Patient Management []  - 0 Hearing / Language / Visual special needs []  - 0 Assessment of Community assistance (transportation, D/C planning, etc.) []  - 0 Additional assistance / Altered mentation []  - 0 Support Surface(s) Assessment (bed, cushion, seat, etc.) INTERVENTIONS - Miscellaneous []  - External ear exam 0 []  - 0 Patient Transfer (multiple staff / Civil Service fast streamer / Similar devices) []  - 0 Simple Staple / Suture removal (25 or less) []  - 0 Complex Staple / Suture removal  (26 or more) []  - 0 Hypo/Hyperglycemic Management (do not check if billed separately) []  - 0 Ankle / Brachial Index (ABI) - do not check if billed separately Has the  patient been seen at the hospital within the last three years: Yes Total Score: 0 Level Of Care: ____ [name] ([id number]) Electronic Signature(s) Signed: 10/03/2021 6:56:11 PM By: Carlene Coria RN Entered By: Carlene Coria on 09/29/2021 11:06:54 Moxey, [name] ([id number]) -------------------------------------------------------------------------------- Encounter Discharge Information Details Patient Name: [name] Date of Service: 09/29/2021 10:15 AM Medical Record Number: [id number] Patient Account Number: [id number] Date of Birth/Sex: 08-29-1923 (87 y.o. F) Treating RN: Carlene Coria Primary Care Faren Florence: Adrian Prows Other Clinician: Referring Hooria Gasparini: Adrian Prows Treating Cyrstal Leitz/Extender: Skipper Cliche in Treatment: 19 Encounter Discharge Information Items Post Procedure Vitals Discharge Condition: Stable Temperature (F): 98.2 Ambulatory Status: Ambulatory Pulse (bpm): 75 Discharge Destination: Home Respiratory Rate (breaths/min): 18 Transportation: Private Auto Blood Pressure (mmHg): 142/82 Accompanied By: son Schedule Follow-up Appointment: Yes Clinical Summary of Care: Patient Declined Electronic Signature(s) Signed: 10/03/2021 6:56:11 PM By: Carlene Coria RN Entered By: Carlene Coria on 09/29/2021 11:09:24 Mcguffin, [name] ([id number]) -------------------------------------------------------------------------------- Lower Extremity Assessment Details Patient Name: [name] Date of Service: 09/29/2021 10:15 AM Medical Record Number: [id number] Patient Account Number: [id number] Date of Birth/Sex: 25-Jul-1923 (87 y.o. F) Treating RN: Carlene Coria Primary Care Lenyx Boody: Adrian Prows Other Clinician: Referring Makaveli Hoard: Adrian Prows Treating  Allenmichael Mcpartlin/Extender: Skipper Cliche in Treatment: 19 Edema Assessment Assessed: [Left: No] Patrice Paradise: No] [Left: Edema] [Right: :] Calf Left: Right: Point of Measurement: 33 cm From Medial Instep 29 cm Ankle Left: Right: Point of Measurement: 10 cm From Medial Instep 17 cm Vascular Assessment Pulses: Dorsalis Pedis Palpable: [Right:Yes] Electronic Signature(s) Signed: 10/03/2021 6:56:11 PM By: Carlene Coria RN Entered By: Carlene Coria on 09/29/2021 10:36:42 Nangle, [name] ([id number]) -------------------------------------------------------------------------------- Multi Wound Chart Details Patient Name: [name] Date of Service: 09/29/2021 10:15 AM Medical Record Number: [id number] Patient Account Number: [id number] Date of Birth/Sex: [birth year] (87 y.o. F) Treating RN: Carlene Coria Primary Care Mariette Cowley: Adrian Prows Other Clinician: Referring Jhonny Calixto: Adrian Prows Treating Caroljean Monsivais/Extender: Skipper Cliche in Treatment: 19 Vital Signs Height(in): 62 Pulse(bpm): 75 Weight(lbs): 150 Blood Pressure(mmHg): 142/82 Body Mass Index(BMI): 27 Temperature(F): 98.2 Respiratory Rate(breaths/min): 18 Photos: [N/A:N/A] Wound Location: Right, Lateral Malleolus N/A N/A Wounding Event: Gradually Appeared N/A N/A Primary Etiology: Pressure Ulcer N/A N/A Comorbid History: Cataracts, Congestive Heart Failure, N/A N/A Hypertension, Peripheral Arterial Disease, Osteoarthritis, Neuropathy Date Acquired: 05/12/2021 N/A N/A Weeks of Treatment: 19 N/A N/A Wound Status: Open N/A N/A Measurements L x W x D (cm) 1x1x0.3 N/A N/A Area (cm) : 0.785 N/A N/A Volume (cm) : 0.236 N/A N/A % Reduction in Area: 30.10% N/A N/A % Reduction in Volume: -4.90% N/A N/A Classification: Category/Stage III N/A N/A Exudate Amount: Medium N/A N/A Exudate Type: Serosanguineous N/A N/A Exudate Color: red, brown N/A N/A Wound Margin: Distinct, outline attached N/A N/A Granulation  Amount: Medium (34-66%) N/A N/A Granulation Quality: Red, Pink, Pale N/A N/A Necrotic Amount: Medium (34-66%) N/A N/A Exposed Structures: Fat Layer (Subcutaneous Tissue): N/A N/A Yes Fascia: No Tendon: No Muscle: No Joint: No Bone: No Epithelialization: Small (1-33%) N/A N/A Treatment Notes Electronic Signature(s) Signed: 10/03/2021 6:56:11 PM By: Carlene Coria RN Entered By: Carlene Coria on 09/29/2021 10:50:31 Ritzel, [name] ([id number]) -------------------------------------------------------------------------------- Multi-Disciplinary Care Plan Details Patient Name: [name] Date of Service: 09/29/2021 10:15 AM Medical Record Number: [id number] Patient Account Number: [id number] Date of Birth/Sex: Dec 18, 1922 (87 y.o. F) Treating RN: Carlene Coria Primary Care [name]: Adrian Prows Other Clinician: Referring Saharsh Sterling: Adrian Prows Treating Oluwatobi Ruppe/Extender: Skipper Cliche in Treatment: 33 Active Inactive Electronic Signature(s) Signed:  10/03/2021 6:56:11 PM By: Carlene Coria RN Entered By: Carlene Coria on 09/29/2021 10:48:27 Mclelland, [name] ([id number]) -------------------------------------------------------------------------------- Pain Assessment Details Patient Name: [name] Date of Service: 09/29/2021 10:15 AM Medical Record Number: [id number] Patient Account Number: [id number] Date of Birth/Sex: 04-May-1923 (87 y.o. F) Treating RN: Carlene Coria Primary Care Shanetra Blumenstock: Adrian Prows Other Clinician: Referring Sherice Ijames: Adrian Prows Treating Kyheem Bathgate/Extender: Skipper Cliche in Treatment: 19 Active Problems Location of Pain Severity and Description of Pain Patient Has Paino No Site Locations Pain Management and Medication Current Pain Management: Electronic Signature(s) Signed: 10/03/2021 6:56:11 PM By: Carlene Coria RN Entered By: Carlene Coria on 09/29/2021 10:33:22 Ojo, [name]  ([id number]) -------------------------------------------------------------------------------- Patient/Caregiver Education Details Patient Name: [name] Date of Service: 09/29/2021 10:15 AM Medical Record Number: [id number] Patient Account Number: [id number] Date of Birth/Gender: 15-May-1923 (87 y.o. F) Treating RN: Carlene Coria Primary Care Physician: Adrian Prows Other Clinician: Referring Physician: Adrian Prows Treating Physician/Extender: Skipper Cliche in Treatment: 45 Education Assessment Education Provided To: Patient Education Topics Provided Wound/Skin Impairment: Methods: Explain/Verbal Responses: State content correctly Electronic Signature(s) Signed: 10/03/2021 6:56:11 PM By: Carlene Coria RN Entered By: Carlene Coria on 09/29/2021 11:08:02 Caputi, [name] ([id number]) -------------------------------------------------------------------------------- Wound Assessment Details Patient Name: [name] Date of Service: 09/29/2021 10:15 AM Medical Record Number: [id number] Patient Account Number: [id number] Date of Birth/Sex: March 26, 1923 (87 y.o. F) Treating RN: Carlene Coria Primary Care Koby Pickup: Adrian Prows Other Clinician: Referring Opaline Reyburn: Adrian Prows Treating Jamicia Haaland/Extender: Skipper Cliche in Treatment: 19 Wound Status Wound Number: 7 Primary Pressure Ulcer Etiology: Wound Location: Right, Lateral Malleolus Wound Open Wounding Event: Gradually Appeared Status: Date Acquired: 05/12/2021 Comorbid Cataracts, Congestive Heart Failure, Hypertension, Weeks Of Treatment: 19 History: Peripheral Arterial Disease, Osteoarthritis, Neuropathy Clustered Wound: No Photos Wound Measurements Length: (cm) 1 Width: (cm) 1 Depth: (cm) 0.3 Area: (cm) 0.785 Volume: (cm) 0.236 % Reduction in Area: 30.1% % Reduction in Volume: -4.9% Epithelialization: Small (1-33%) Tunneling: No Undermining: No Wound  Description Classification: Category/Stage III Wound Margin: Distinct, outline attached Exudate Amount: Medium Exudate Type: Serosanguineous Exudate Color: red, brown Foul Odor After Cleansing: No Slough/Fibrino Yes Wound Bed Granulation Amount: Medium (34-66%) Exposed Structure Granulation Quality: Red, Pink, Pale Fascia Exposed: No Necrotic Amount: Medium (34-66%) Fat Layer (Subcutaneous Tissue) Exposed: Yes Tendon Exposed: No Muscle Exposed: No Joint Exposed: No Bone Exposed: No Treatment Notes Wound #7 (Malleolus) Wound Laterality: Right, Lateral Cleanser Soap and Water Discharge Instruction: Gently cleanse wound with antibacterial soap, rinse and pat dry prior to dressing wounds Peri-Wound Care Convery, [name] ([id number]) Topical Primary Dressing Prisma 4.34 (in) Discharge Instruction: Moisten w/normal saline or sterile water; Cover wound as directed. Do not remove from wound bed. Secondary Dressing ABD Pad 5x9 (in/in) Discharge Instruction: Cover with ABD pad Secured With Compression Wrap Profore Lite LF 3 Multilayer Compression Bandaging System Discharge Instruction: Apply 3 multi-layer wrap as prescribed. Compression Stockings Add-Ons Electronic Signature(s) Signed: 10/03/2021 6:56:11 PM By: Carlene Coria RN Entered By: Carlene Coria on 09/29/2021 10:34:59 Whitham, [name] ([id number]) -------------------------------------------------------------------------------- Vitals Details Patient Name: [name] Date of Service: 09/29/2021 10:15 AM Medical Record Number: [id number] Patient Account Number: [id number] Date of Birth/Sex: 1923-03-18 (87 y.o. F) Treating RN: Carlene Coria Primary Care Johnni Wunschel: Adrian Prows Other Clinician: Referring Luisdaniel Kenton: Adrian Prows Treating Yaiza Palazzola/Extender: Skipper Cliche in Treatment: 19 Vital Signs Time Taken: 10:32 Temperature (F): 98.2 Height (in): 62 Pulse (bpm): 75 Weight (lbs):  150 Respiratory Rate (breaths/min): 18 Body Mass Index (BMI): 27.4 Blood Pressure (  mmHg): 142/82 Reference Range: 80 - 120 mg / dl Electronic Signature(s) Signed: 10/03/2021 6:56:11 PM By: Carlene Coria RN Entered By: Carlene Coria on 09/29/2021 10:33:08

## 2021-10-06 ENCOUNTER — Other Ambulatory Visit: Payer: Self-pay

## 2021-10-06 ENCOUNTER — Encounter: Payer: Medicare Other | Attending: Physician Assistant | Admitting: Physician Assistant

## 2021-10-06 ENCOUNTER — Other Ambulatory Visit
Admission: RE | Admit: 2021-10-06 | Discharge: 2021-10-06 | Disposition: A | Discharge status: Home / Self Care | Payer: Medicare Other | Source: Ambulatory Visit | Attending: Physician Assistant | Admitting: Physician Assistant

## 2021-10-06 DIAGNOSIS — L89513 Pressure ulcer of right ankle, stage 3: Secondary | ICD-10-CM | POA: Insufficient documentation

## 2021-10-06 DIAGNOSIS — I890 Lymphedema, not elsewhere classified: Secondary | ICD-10-CM | POA: Insufficient documentation

## 2021-10-06 DIAGNOSIS — Z881 Allergy status to other antibiotic agents status: Secondary | ICD-10-CM | POA: Insufficient documentation

## 2021-10-06 DIAGNOSIS — Z950 Presence of cardiac pacemaker: Secondary | ICD-10-CM | POA: Insufficient documentation

## 2021-10-06 DIAGNOSIS — I872 Venous insufficiency (chronic) (peripheral): Secondary | ICD-10-CM | POA: Insufficient documentation

## 2021-10-06 DIAGNOSIS — Z7901 Long term (current) use of anticoagulants: Secondary | ICD-10-CM | POA: Insufficient documentation

## 2021-10-06 DIAGNOSIS — L97319 Non-pressure chronic ulcer of right ankle with unspecified severity: Secondary | ICD-10-CM | POA: Insufficient documentation

## 2021-10-06 DIAGNOSIS — G629 Polyneuropathy, unspecified: Secondary | ICD-10-CM | POA: Insufficient documentation

## 2021-10-06 DIAGNOSIS — M1990 Unspecified osteoarthritis, unspecified site: Secondary | ICD-10-CM | POA: Insufficient documentation

## 2021-10-06 NOTE — Progress Notes (Addendum)
[name], [name] ([id number]) Visit Report for 10/06/2021 Chief Complaint Document Details Patient Name: [name], [name]. Date of Service: 10/06/2021 10:15 AM Medical Record Number: [id number] Patient Account Number: [id number] Date of Birth/Sex: 12-26-1922 (87 y.o. F) Treating RN: Carlene Coria Primary Care Provider: Adrian Prows Other Clinician: Referring Provider: Adrian Prows Treating Provider/Extender: Skipper Cliche in Treatment: 20 Information Obtained from: Patient Chief Complaint Right foot ulcer Electronic Signature(s) Signed: 10/06/2021 10:38:01 AM By: Worthy Keeler PA-C Entered By: Worthy Keeler on 10/06/2021 10:38:01 [name], [name] ([id number]) -------------------------------------------------------------------------------- HPI Details Patient Name: [name] Date of Service: 10/06/2021 10:15 AM Medical Record Number: [id number] Patient Account Number: [id number] Date of Birth/Sex: [birth year] (87 y.o. 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 Dr. Amalia Hailey the podiatrist saw her on 07/20/2017 and sent her to the wound center for possible hyperbaric oxygen therapy. past medical history of peripheral vascular disease, varicose veins, status post appendectomy, basal cell carcinoma excision from the left leg, cholecystectomy, pacemaker placement, right lower extremity angiography done by Dr. dew in March 2017 with placement of a stent. there is also note of a successful ablation of the right small saphenous vein done which was reviewed by ultrasound on 10/24/2016. the  patient had a right small saphenous vein ablation done on 10/20/2016. The patient has never been a smoker. She has been seen by Dr. Corene Cornea dew the vascular surgeon who most recently saw her on 06/15/2017 for evaluation of ongoing problems with right leg swelling. She had a lower extremity arterial duplex examination done(02/13/17) which showed patent distal right superficial femoral artery stent and above-the-knee popliteal stent without evidence of restenosis. The ABI was more than 1.3 on the right and more than 1.3 on the left. This was consistent with noncompressible arteries due to medial calcification. The right great toe pressure and PPG waveforms are within normal limits and the left great toe pressure and PPG waveforms are decreased. he recommended she continue to wear her compression stockings and continue with elevation. She is scheduled to have a noninvasive arterial study in the near future 08/16/2017 -- had a lower extremity arterial duplex examination done which showed patent distal right superficial femoral artery stent and above-the-knee popliteal stent without evidence of restenosis. The ABI was more than 1.3 on the right and more than 1.3 on the left. This was consistent with noncompressible arteries due to medial calcification. The right great toe pressure and PPG waveforms are within normal limits and the left great toe pressure and PPG waveforms are decreased. the x-ray of the right ankle has not yet been done 08/24/2017 -- had a right ankle x-ray -- IMPRESSION:1. No fracture, bone lesion or evidence of osteomyelitis. 2. Lateral soft tissue swelling with a soft tissue ulcer. she has not yet seen the vascular surgeon for review 08/31/17 on evaluation today patient's wound appears to be showing signs of improvement. She still with her appointment with vascular in order to review her results of her vascular study and then determine if any intervention would be recommended at that  time. No fevers, chills, nausea, or vomiting noted at this time. She has been tolerating the dressing changes without complication. 09/28/17 on evaluation today patient's  wound appears to show signs of good improvement in regard to the granulation tissue which is surfacing. There is still a layer of slough covering the wound and the posterior portion is still significantly deeper than the anterior nonetheless there has been some good sign of things moving towards the better. She is going to go back to Dr. dew for reevaluation to ensure her blood flow is still appropriate. That will be before her next evaluation with [place] next week. No fevers, chills, nausea, or vomiting noted at this time. Patient does have some discomfort rated to be a 3-4/10 depending on activity specifically cleansing the wound makes it worse. 10/05/2017 -- the patient was seen by Dr. Lucky Cowboy last week and noninvasive studies showed a normal right ABI with brisk triphasic waveforms consistent with no arterial insufficiency including normal digital pressures. The duplex showed a patent distal right SFA stent and the proximal SFA was also normal. He was pleased with her test and thought she should have enough of perfusion for normal wound healing. He would see her back in 6 months time. 12/21/17 on evaluation today patient appears to be doing fairly well in regard to her right lateral ankle wound. Unfortunately the main issue that she is expansion at this point is that she is having some issues with what appears to be some cellulitis in the right anterior shin. She has also been noting a little bit of uncomfortable feeling especially last night and her ankle area. I'm afraid that she made the developing a little bit of an infection. With that being said I think it is in the early stages. 12/28/17 on evaluation today patient's ankle appears to be doing excellent. She's making good progress at this point the cellulitis seems to have improved  after last week's evaluation. Overall she is having no significant discomfort which is excellent news. She does have an appointment with Dr. dew on March 29, 2018 for reevaluation in regard to the stent he placed. She seems to have excellent blood flow in the right lower extremity. 01/19/12 on evaluation today patient's wound appears to be doing very well. In fact she does not appear to require debridement at this point, there's no evidence of infection, and overall from the standpoint of the wound she seems to be doing very well. With that being said I believe that it may be time to switch to different dressing away from the [facility] Dressing she tells me she does have a lot going on her friend actually passed away yesterday and she's also having a lot of issues with her husband this obviously is weighing heavy on her as far as your thoughts and concerns today. 01/25/18 on evaluation today patient appears to be doing fairly well in regard to her right lateral malleolus. She has been tolerating the dressing changes without complication. Overall I feel like this is definitely showing signs of improvement as far as how the overall appearance of the wound is there's also evidence of epithelium start to migrate over the granulation tissue. In general I think that she is progressing nicely as far as the wound is concerned. The only concern she really has is whether or not we can switch to every other week visits in order to avoid having as many appointments as her daughters have a difficult time getting her to her appointments as well as the patient's husband to his he is not doing very well at this point. 02/22/18 on evaluation today patient's right lateral malleolus ulcer  appears to be doing great. She has been tolerating the dressing changes without complication. Overall you making excellent progress at this time. Patient is having no significant discomfort. [name], [name] ([id number]) 03/15/18  on evaluation today patient appears to be doing much more poorly in regard to her right lateral ankle ulcer at this point. Unfortunately since have last seen her her husband has passed just a few days ago is obviously weighed heavily on her her daughter also had surgery well she is with her today as usual. There does not appear to be any evidence of infection she does seem to have significant contusion/deep tissue injury to the right lateral malleolus which was not noted previous when I saw her last. It's hard to tell of exactly when this injury occurred although during the time she was spending the night in the hospital this may have been most likely. 03/22/18 on evaluation today patient appears to actually be doing very well in regard to her ulcer. She did unfortunately have a setback which was noted last week however the good news is we seem to be getting back on track and in fact the wound in the core did still have some necrotic tissue which will be addressed at this point today but in general I'm seeing signs that things are on the up and up. She is glad to hear this obviously she's been somewhat concerned that due to the how her wound digressed more recently. 03/29/18 on evaluation today patient appears to be doing fairly well in regard to her right lower extremity lateral malleolus ulcer. She unfortunately does have a new area of pressure injury over the inferior portion where the wound has opened up a little bit larger secondary to the pressure she seems to be getting. She does tell me sometimes when she sleeps at night that it actually hurts and does seem to be pushing on the area little bit more unfortunately. There does not appear to be any evidence of infection which is good news. She has been tolerating the dressing changes without complication. She also did have some bruising in the left second and third toes due to the fact that she may have bump this or injured it although she has  neuropathy so she does not feel she did move recently that may have been where this came from. Nonetheless there does not appear to be any evidence of infection at this time. 04/12/18 on evaluation today patient's wound on the right lateral ankle actually appears to be doing a little bit better with a lot of necrotic docking tissue centrally loosening up in clearing away. However she does have the beginnings of a deep tissue injury on the left lateral malleolus likely due to the fact we've been trying offload the right as much as we have. I think she may benefit from an assistive soft device to help with offloading and it looks like they're looking at one of the doughnut conditions that wraps around the lower leg to offload which I think will definitely do a good job. With that being said I think we definitely need to address this issue on the left before it becomes a wound. Patient is not having significant pain. 04/19/18 on evaluation today patient appears to be doing excellent in regard to the progress she's made with her right lateral ankle ulcer. The left ankle region which did show evidence of a deep tissue injury seems to be resolving there's little fluid noted underneath and a blister there's  nothing open at this point in time overall I feel like this is progressing nicely which is good news. She does not seem to be having significant discomfort at this point which is also good news. 04/25/18-She is here in follow up evaluation for bilateral lateral malleolar ulcers. The right lateral malleolus ulcer with pale subcutaneous tissue exposure, central area of ulcer with tendon/periosteum exposed. The left lateral malleolus ulcer now with central area of nonviable tissue, otherwise deep tissue injury. She is wearing compression wraps to the left lower extremity, she will place the right lower extremity compression wraps on when she gets home. She will be out of town over the weekend and return next  week and follow-up appointment. She completed her doxycycline this morning 05/03/18 on evaluation today patient appears to be doing very well in regard to her right lateral ankle ulcer in general. At least she's showing some signs of improvement in this regard. Unfortunately she has some additional injury to the left lateral malleolus region which appears to be new likely even over the past several days. Again this determination is based on the overall appearance. With that being said the patient is obviously frustrated about this currently. 05/10/18-She is here in follow-up evaluation for bilateral lateral malleolar ulcers. She states she has purchased offloading shoes/boots and they will arrive tomorrow. She was asked to bring them in the office at next week's appointment so her provider is aware of product being utilized. She continues to sleep on right or left side, she has been encouraged to sleep on her back. The right lateral malleolus ulcer is precariously close to peri-osteum; will order xray. The left lateral malleolus ulcer is improved. Will switch back to santyl; she will follow up next week. 05/17/18 on evaluation today patient actually appears to be doing very well in regard to her malleolus her ulcers compared to last time I saw them. She does not seem to have as much in the way of contusion at this point which is great news. With that being said she does continue to have discomfort and I do believe that she is still continuing to benefit from the offloading/pressure reducing boots that were recommended. I think this is the key to trying to get this to heal up completely. 05/24/18 on evaluation today patient actually appears to be doing worse at this point in time unfortunately compared to her last week's evaluation. She is having really no increased pain which is good news unfortunately she does have more maceration in your theme and noted surrounding the right lateral ankle the left  lateral ankle is not really is erythematous I do not see signs of the overt cellulitis on that side. Unfortunately the wounds do not seem to have shown any signs of improvement since the last evaluation. She also has significant swelling especially on the right compared to previous some of this may be due to infection however also think that she may be served better while she has these wounds by compression wrapping versus continuing to use the Juxta-Lite for the time being. Especially with the amount of drainage that she is experiencing at this point. No fevers, chills, nausea, or vomiting noted at this time. 05/31/18 on evaluation today patient appears to actually be doing better in regard to her right lateral lower extremity ulcer specifically on the malleolus region. She has been tolerating the antibiotic without complication. With that being said she still continues to have issues but a little bit of redness although nothing like  she what she was experiencing previous. She still continues to pressure to her ankle area she did get the problem on offloading boots unfortunately she will not wear them she states there too uncomfortable and she can't get in and out of the bed. Nonetheless at this point her wounds seem to be continually getting worse which is not what we want I'm getting somewhat concerned about her progress and how things are going to proceed if we do not intervene in some way shape or form. I therefore had a very lengthy conversation today about offloading yet again and even made a specific suggestion for switching her to a memory foam mattress and even gave the information for a specific one that they could look at getting if it was something that they were interested in considering. She does not want to be considered for a hospital bed air mattress although honestly insurance would not cover it that she does not have any wounds on her trunk. 06/14/18 on evaluation today both wounds  over the bilateral lateral malleolus her ulcers appear to be doing better there's no evidence of pressure injury at this point. She did get the foam mattress for her bed and this does seem to have been extremely beneficial for her in my pinion. Her daughter states that she is having difficulty getting out of bed because of how soft it is. The patient also relates this to be. Nonetheless I do feel like she's actually doing better. Unfortunately right after and around the time she was getting the mattress she also sustained a fall when she got up to go pick up the phone and ended up injuring her right elbow she has [address] sut[address] place. We are not caring for this currently although home health is going to be taking the sutures out shortly. Nonetheless this may be something that we need to evaluate going forward. It depends on how well it has or has not healed in the end. She also recently saw an orthopedic specialist for an injection in the right shoulder just before her fall unfortunately the fall seems to have worsened her pain. 06/21/18 on evaluation today patient appears to be doing about the same in regard to her lateral malleolus ulcers. Both appear to be just a little bit deeper but again we are clinging away the necrotic and dead tissue which I think is why this is progressing towards a deeper realm as opposed [name], [name]. ([id number]) to improving from my measurement standpoint in that regard. Nonetheless she has been tolerating the dressing changes she absolutely hates the memory foam mattress topper that was obtained for her nonetheless I do believe this is still doing excellent as far as taking care of excess pressure in regard to the lateral malleolus regions. She in fact has no pressure injury that I see whereas in weeks past it was week by week I was constantly seeing new pressure injuries. Overall I think it has been very beneficial for her. 07/03/18; patient arrives in my clinic  today. She has deep punched out areas over her bilateral lateral malleoli. The area on the right has some more depth. We spent a lot of time today talking about pressure relief for these areas. This started when her daughter asked for a prescription for a memory foam mattress. I have never written a prescription for a mattress and I don't think insurances would pay for that on an ordinary bed. In any case he came up that she has foam  boots that she refuses to wear. I would suggest going to these before any other offloading issues when she is in bed. They say she is meticulous about offloading this the rest of the day 07/10/18- She is seen in follow-up evaluation for bilateral, lateral malleolus ulcers. There is no improvement in the ulcers. She has purchased and is sleeping on a memory foam mattress/overlay, she has been using the offloading boots nightly over the past week. She has a follow up appointment with vascular medicine at the end of October, in my opinion this follow up should be expedited given her deterioration and suboptimal TBI results. We will order plain film xray of the left ankle as deeper structures are palpable; would consider having MRI, regardless of xray report(s). The ulcers will be treated with iodoflex/iodosorb, she is unable to safely change the dressings daily with santyl. 07/19/18 on evaluation today patient appears to be doing in general visually well in regard to her bilateral lateral malleolus ulcers. She has been tolerating the dressing changes without complication which is good news. With that being said we did have an x-ray performed on 07/12/18 which revealed a slight loosen see in the lateral portion of the distal left fibula which may represent artifact but underline lytic destruction or osteomyelitis could not be excluded. MRI was recommended. With that being said we can see about getting the patient scheduled for an MRI to further evaluate this area. In fact we have  that scheduled currently for August 20 19,019. 07/26/18 on evaluation today patient's wound on the right lateral ankle actually appears to be doing fairly well at this point in my pinion. She has made some good progress currently. With that being said unfortunately in regard to the left lateral ankle ulcer this seems to be a little bit more problematic at this time. In fact as I further evaluated the situation she actually had bone exposed which is the first time that's been the case in the bone appear to be necrotic. Currently I did review patient's note from Dr. Bunnie Domino office with [facility]. He stated that ABI was 1.26 on the right and 0.95 on the left with good waveforms. Her perfusion is stable not reduced from previous studies and her digital waveforms were pretty good particularly on the right. His conclusion upon review of the note was that there was not much she could do to improve her perfusion and he felt she was adequate for wound healing. His suggestion was that she continued to see [place] and consider a synthetic skin graft if there was no underlying infection. He plans to see her back in six months or as needed. 08/01/18 on evaluation today patient appears to be doing better in regard to her right lateral ankle ulcer. Her left lateral ankle ulcer is about the same she still has bone involvement in evidence of necrosis. There does not appear to be evidence of infection at this time On the right lateral lower extremity. I have started her on the Augmentin she picked this up and started this yesterday. This is to get her through until she sees infectious disease which is scheduled for 08/12/18. 08/06/18 on evaluation today patient appears to be doing rather well considering my discussion with patient's daughter at the end of last week. The area which was marked where she had erythema seems to be improved and this is good news. With that being said overall the patient seems  to be making good improvement when it  comes to the overall appearance of the right lateral ankle ulcer although this has been slow she at least is coming around in this regard. Unfortunately in regard to the left lateral ankle ulcer this is osteomyelitis based on the pathology report as well is bone culture. Nonetheless we are still waiting CT scan. Unfortunately the MRI we originally ordered cannot be performed as the patient is a pacemaker which I had overlooked. Nonetheless we are working on the CT scan approval and scheduling as of now. She did go to the hospital over the weekend and was placed on IV Cefzo for a couple of days. Fortunately this seems to have improved the erythema quite significantly which is good news. There does not appear to be any evidence of worsening infection at this time. She did have some bleeding after the last debridement therefore I did not perform any sharp debridement in regard to left lateral ankle at this point. Patient has been approved for a snap vac for the right lateral ankle. 08/14/18; the patient with wounds over her bilateral lateral malleoli. The area on the right actually looks quite good. Been using a snap back on this area. Healthy granulation and appears to be filling in. Unfortunately the area on the left is really problematic. 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 Also noted to have cellulitis. She saw Dr. Novella Olive of infectious disease today and was put on linezolid. We are able to verify this with her pharmacy. She is completed the Augmentin that she was already on. We've been using Iodoflex to this area 08/23/18 on evaluation today patient's wounds both actually appear to be doing better compared to my prior evaluations. Fortunately she showing signs of good improvement in regard to the overall wound status especially where were using the snap vac on the right. In  regard to left lateral malleolus the wound bed actually appears to be much cleaner than previously noted. I do not feel any phone directly probed during evaluation today and though there is tendon noted this does not appear to be necrotic it's actually fairly good as far as the overall appearance of the tendon is concerned. In general the wound bed actually appears to be doing significantly better than it was previous. Patient is currently in the care of [name] and I did review that note today. He actually has her on two weeks of linezolid and then following the patient will be on 1-2 months of Keflex. That is the plan currently. She has been on antibiotics therapy as prescribed by myself initially starting on July 30, 2018 and has been on that continuously up to this point. 08/30/18 on evaluation today patient actually appears to be doing much better in regard to her right lateral malleolus ulcer. She has been tolerating the dressing changes specifically the snap vac without complication although she did have some issues with the seal currently. Apparently there was some trouble with getting it to maintain over the past week past Sunday. Nonetheless overall the wound appears better in regard to the right lateral malleolus region. In regard to left lateral malleolus this actually show some signs of additional granulation although there still tendon noted in the base of the wound this appears to be healthy not necrotic in any way whatsoever. We are considering potentially using a snap vac for the left lateral malleolus as well the product wrap from [employer], [place], was present in  the clinic today we're going to see this patient I did have her come in with me after obtaining consent from the patient and her daughter in order to look at the wound and see if there's any recommendation one way or another as to whether or not they felt the snapback could be beneficial for the left lateral malleolus region.  But the conclusion was that it might be but that this is definitely a little bit deeper wound than what traditionally would be utilized for a snap vac. 09/06/18 on evaluation today patient actually appears to be doing excellent in my pinion in regard to both ankle ulcers. She has been tolerating the dressing changes without complication which is great news. Specifically we have been using the snap vac. In regard to the right ankle I'm not even sure that this is going to be necessary for today and following as the wound has filled in quite nicely. In regard to the left ankle I do believe [name], [name] ([id number]) that we're seeing excellent epithelialization from the edge as well as granulation in the central portion the tendon is still exposed but there's no evidence of necrotic bone and in general I feel like the patient has made excellent progress even compared to last week with just one week of the snap vac. 09/11/18; this is a patient who has wounds on her bilateral lateral malleoli. Initially both of these were deep stage IV wounds in the setting of chronic arterial insufficiency. She has been revascularized. As I understand think she been using snap vacs to both of these wounds however the area on the right became more superficial and currently she is only using it on the left. Using silver collagen on the right and silver collagen under the back on the left I believe 09/19/18 on evaluation today patient actually appears to be doing very well in regard to her lateral malleolus or ulcers bilaterally. She has been tolerating the dressing changes without complication. Fortunately there does not appear to be any evidence of infection at this time. Overall I feel like she is improving in an excellent manner and I'm very pleased with the fact that everything seems to be turning towards the better for her. This has obviously been a long road. 09/27/18 on evaluation today patient actually appears  to be doing very well in regard to her bilateral lateral malleolus ulcers. She has been tolerating the dressing changes without complication. Fortunately there does not appear to be any evidence of infection at this time which is also great news. No fevers, chills, nausea, or vomiting noted at this time. Overall I feel like she is doing excellent with the snap vac on the left malleolus. She had 40 mL of fluid collection over the past week. 10/04/18 on evaluation today patient actually appears to be doing well in regard to her bilateral lateral malleolus ulcers. She continues to tolerate the dressing changes without complication. One issue that I see is the snap vac on the left lateral malleolus which appears to have sealed off some fluid underlying this area and has not really allowed it to heal to the degree that I would like to see. For that reason I did suggest at this point we may want to pack a small piece of packing strip into this region to allow it to more effectively wick out fluid. 10/11/18 in general the patient today does not feel that she has been doing very well. She's been a little bit lethargic and  subsequently is having bodyaches as well according to what she tells me today. With that being said overall she has been concerned with the fact that something may be worsening although to be honest her wounds really have not been appearing poorly. She does have a new ulcer on her left heel unfortunately. This may be pressure related. Nonetheless it seems to me to have potentially started at least as a blister I do not see any evidence of deep tissue injury. In regard to the left ankle the snap vac still seems to be causing the ceiling off of the deeper part of the wound which is in turn trapping fluid. I'm not extremely pleased with the overall appearance as far as progress from last week to this week therefore I'm gonna discontinue the snap vac at this point. 10/18/18 patient unfortunately  this point has not been feeling well for the past several days. She was seen by Grayland Ormond her primary care provider who is a Librarian, academic at [facility]. Subsequently she states that she's been very weak and generally feeling malaise. No fevers, chills, nausea, or vomiting noted at this time. With that being said bloodwork was performed at the PCP office on the 11th of this month which showed a white blood cell count of 10.7. This was repeated today and shows a white blood cell count of 12.4. This does show signs of worsening. Coupled with the fact that she is feeling worse and that her left ankle wound is not really showing signs of improvement I feel like this is an indication that the osteomyelitis is likely exacerbating not improving. Overall I think we may also want to check her C-reactive protein and sedimentation rate. Actually did call [facility] office this afternoon while the patient was in the office here with me. Subsequently based on the findings we discussed treatment possibilities and I think that it is appropriate for [place] to go ahead and initiate treatment with doxycycline which I'm going to do. Subsequently he did agree to see about adding a CRP and sedimentation rate to her orders. If that has not already been drawn to where they can run it they will contact the patient she can come back to have that check. They are in agreement with plan as far as the patient and her daughter are concerned. Nonetheless also think we need to get in touch with Dr. Henreitta Leber office to see about getting the patient scheduled with him as soon as possible. 11/08/18 on evaluation today patient presents for follow-up concerning her bilateral foot and ankle ulcers. I did do an extensive review of her chart in epic today. Subsequently she was seen by [name] he did initiate Cefepime IV antibiotic therapy. Subsequently she had some issues with her PICC line this had to be removed because it  was coiled and then replaced. Fortunately that was now settled. Unfortunately she has continued have issues with her left heel as well as the issues that she is experiencing with her bilateral lateral malleolus regions. I do believe however both areas seem to be doing a little bit better on evaluation today which is good news. No fevers, chills, nausea, or vomiting noted at this time. She actually has an angiogram schedule with Dr. dew on this coming Monday, November 11, 2018. Subsequently the patient states that she is feeling much better especially than what she was roughly 2 weeks ago. She actually had to cancel an appointment because she was feeling so poorly. No fevers,  chills, nausea, or vomiting noted at this time. 11/15/18 on evaluation today patient actually is status post having had her angiogram with Dr. dew Monday, four days ago. 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 He had to go and work on several areas of the vasculature fortunately he was able to obtain no more than a 30% residual stenosis throughout post procedure. I reviewed this note today. I think this will definitely help with healing at this time. Fortunately there does not appear to be any signs of infection and I do feel like ratio already has a better appearance to it. 11/22/18 upon evaluation today patient actually appears to be doing very well in regard to her wounds in general. The right lateral malleolus looks excellent the heel looks better in the left lateral malleolus also appears to be doing a little better. With that being said the right second toe actually appears to be open and training we been watching this is been dry and stable but now is open. 12/03/2018 Seen today for follow-up and management of multiple bilateral lower extremity wounds. New pressure injury of the great toe which is closed at this time. Wound of the right distal second toe appears larger today with deep undermining  and a pocket of fluid present within the undermining region. Left and right malleolus is wounds are stable today with no signs and symptoms of infection.Denies any needs or concerns during exam today. 12/13/18 on evaluation today patient appears to be doing somewhat better in regard to her left heel ulcer. She also seems to be completely healed in regard to the right lateral malleolus ulcer. The left malleolus ulcer is smaller what unfortunately the wounds which are new over the first and second toes of the right foot are what are most concerning at this point especially the second. Both areas did require sharp debridement today. 12/20/18 on evaluation today patient's wound actually appears to be doing better in regard to left lateral ankle and her right lateral ankle continues to remain healed. The hill ulcer on the left is improved. She does have improvement noted as well in regard to both toe ulcers. Overall I'm very pleased in this regard. No fevers, chills, nausea, or vomiting noted at this time. 12/23/18 on evaluation today patient is seen after she had her toenails trimmed at the podiatrist office due to issues with her right great toe. There was what appeared to be dark eschar on the surface of the wound which had her in the podiatrist concerned. Nonetheless as I remember that during the last office visit I had utilize silver nitrate of this area I was much less concerned about the situation. Subsequently I was able to clean off much of this tissue without any complication today. This does not appear to show any signs of infection and actually look somewhat better Somera, [name] ([id number]) compared to last time post debridement. Her second toe on the right foot actually had callous over and there did appear still be some fluid underneath this that would require debridement today. 12/27/18 on evaluation today patient actually appears to be showing signs of improvement at all locations. Even  the left lateral ankle although this is not quite as great as the other sites. Fortunately there does not appear to be any signs of infection at this time and both of her toes on the right foot seem to be showing signs of improvement which is good news and very pleased  in this regard. 01/03/19 on evaluation today patient appears to be doing better for the most part in regard to her wounds in particular. There does not appear to be any evidence of infection at this time which is good news. Fortunately there is no sign of really worsening anywhere except for the right great toe which she does have what appears to be a bruise/deep tissue injury which is very superficial and already resolving. I'm not sure where this came from I questioned her extensively and she does not recall what may have happened with this. Other than that the patient seems to be doing well even the left lateral ankle ulcer looks good and is getting smaller. 01/10/19 on evaluation today patient appears to be doing well in regard to her left heel wound and both of her toe wounds. Overall I feel like there is definitely improvement here and I'm happy in that regard. With that being said unfortunately she is having issues with the left lateral malleolus ulcer which unfortunately still has a lot of depth to it. This is gonna be a very difficult wound for [place] to be able to truly get to heal. I may want to consider some type of skin substitute to see if this would be of benefit for her. I'll discuss this with her more the next visit most likely. This was something I thought about more at the end of the visit when I was Artie out of the room and the patient had been discharged. 01/17/19 on evaluation today patient appears to be doing very well in regard to her wounds in general. She's been making excellent progress at this time. Fortunately there's no sign of infection at this time either. No fevers, chills, nausea, or vomiting noted at this  time. The biggest issue is still her left lateral malleolus where it appears to be doing well and is getting smaller but still shows a small corner where this is deeper and goes down into what appears to be the joint space. Nonetheless this is taking much longer to heal although it still looks better in smaller than previous evaluations. 01/24/19 on evaluation today patient's wounds actually appear to be doing rather well in general overall. She did require some sharp debridement in regard to the right great toe but everything else appears to be doing excellent no debridement was even necessary. No fevers, chills, nausea, or vomiting noted at this time. 01/31/19 on evaluation today patient actually appears to be doing much better in regard to her left foot wound on the heel as well as the ankle. The right great toe appears to be a little bit worse today this had callous over and trapped a lot of fluid underneath. Fortunately there's no signs of infection at any site which is great news. 02/07/19 on evaluation today patient actually appears to be doing decently well in regard to all of her ulcers at this point. No sharp debridement was required she is a little bit of hyper granulation in regard to the left lateral ankle as well as the left heel but the hill itself is almost completely healed which is excellent news. Overall been very pleased in this regard. 02/14/19 on evaluation today patient actually appears to be doing very well in regard to her ulcers on the right first toe, left lateral malleolus, and left heel. In fact the heel is almost completely healed at this point. The patient does not show any signs of infection which is good news. Overall very  pleased with how things have progressed. 04/18/19 Telehealth Evaluation During the COVID-19 National Emergency: Verbal Consent: Obtained from patient Allergies: reviewed and the active list is current. Medication changes: patient has no current  medication changes. COVID-19 Screening: 1. Have you traveled internationally or on a cruise ship in the last 14 dayso No 2. Have you had contact with someone with or under investigation for COVID-19o No 3. Have you had a fever, cough, sore throat, or experiencing shortness of breatho No on evaluation today actually did have a visit with this patient through a telehealth encounter with her home health nurse. Subsequently it was noted that the patient actually appears to be doing okay in regard to her wounds both the right great toe as well as the left lateral malleolus have shown signs of improvement although this in your theme around the left lateral malleolus there eschar coverings for both locations. The question is whether or not they are actually close and whether or not home health needs to discharge the patient or not. Nonetheless my concern is this point obviously is that without actually seeing her and being able to evaluate this directly I cannot ensure that she is completely healed which is the question that I'm being asked. 04/22/19 on evaluation today patient presents for her first evaluation since last time I saw her which was actually February 14, 2019. I did do a telehealth visit last week in which point it was questionable whether or not she may be healed and had to bring her in today for confirmation. With that being said she does seem to be doing quite well at this point which is good news. There does not appear to be any drainage in the deed I believe her wounds may be healed. Readmission: 09/04/2019 on evaluation today patient appears to be doing unfortunately somewhat more poorly in regard to her left foot ulcer secondary to a wound that began on 08/21/2019 at least when she first noticed this. Fortunately she has not had any evidence of active infection at this time. Systemically. I also do not necessarily see any evidence of infection at the blister/wound site on the first  metatarsal head plantar aspect. This almost appears to be something that may have just rubbed inappropriately causing this to breakdown. They did not want a wait too long to come in to be seen as again she had significant issues in the past with wounds that took quite a while to heal in fact it was close to 2 years. Nonetheless this does not appear to be quite that bad but again we do need to remove some of the necrotic tissue from the surface of the wound to tell exactly the extent. She does not appear to have any significant arterial disease at this point and again her last ABIs and TBI's are recorded above in the alert section her left ABI was 1.27 with a TBI of 0.72 to the right ABI 1.08 with a TBI of 0.39. Other than this the patient has been doing quite well since I last saw her and that was in May 2020. 09/11/2019 on evaluation today patient appeared to be doing very well with regard to her plantar foot ulcer on the left. In fact this appears to be almost completely healed which is awesome. That is after just 1 week of intervention. With that being said there is no signs of active infection at this time. [name], [name] ([id number]) 09/18/2019 on evaluation today patient actually appears to be doing  excellent in fact she is completely healed based on what I am seeing at this point. Fortunately there is no signs of active infection at this time and overall patient is very pleased to hear that this area has healed so quickly. Readmission: 05/13/2021 upon evaluation today this patient presents for reevaluation here in the clinic. This is a wound that actually we previously took care of. She had 1 on the right ankle and the left the left turned out to be be harder due to to heal but nonetheless is doing great at this point as the right that has reopened and it was noted first just several weeks ago with a scab over it and came off in just the past few days. Fortunately there does not appear to  be any obvious evidence of significant active infection at this time which is great news. No fevers, chills, nausea, vomiting, or diarrhea. 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 There does not appear to be any signs of this interfering in any way with her wound. She does have swelling we previously had compression socks for her ordered but again it does not look like she wears these on a regular basis by any means. 05/26/2021 upon evaluation today patient appears to be doing well with regard to her wound which is actually showing signs of excellent improvement. There does not appear to be any signs of active infection which is great news and overall very pleased with where things stand today. No fevers, chills, nausea, vomiting, or diarrhea. 06/02/2021 upon evaluation today patient's wound actually showing signs of excellent improvement. Fortunately there does not appear to be any signs of active infection which is great news. I think the patient is making good progress with regard to her wounds in general. 06/09/2021 upon evaluation today patient appears to be doing excellent in regard to her wounds currently. Fortunately there does not appear to be any signs of active infection which is great news. No fevers, chills, nausea, vomiting, or diarrhea. Overall extremely pleased with where things stand today. I think the patient is making excellent progress. 06/16/2021 upon evaluation today patient appears to be doing well in regard to her wound. This is going require little bit of debridement today and that was discussed with the patient. Otherwise she seems to be doing quite well and I am actually very pleased with where things stand at this point. No fevers, chills, nausea, vomiting, or diarrhea. 06/23/2021 upon evaluation today patient appears to be doing well with regard to her wounds. She has been tolerating the dressing changes without complication.  Fortunately there does not appear to be any evidence of infection and she has not had air in her home which she actually lives at an assisted living that got fixed this morning. With that being said because of that her wrap has been extremely hot and bothersome for her over the past week. 06/30/2021 upon evaluation today patient is actually making excellent progress in regard to her ankle ulcer. She has been tolerating the dressing changes without complication and overall extremely pleased with where things stand there does not appear to be any evidence of active infection which is great news. No fevers, chills, nausea, vomiting, or diarrhea. 07/07/21 upon evaluation today patients and culture on the right actually appears to be doing quite well. There does not appear to be any signs of infection and overall very pleased with where things stand today. No fevers,  chills, nausea, or vomiting noted at this time. 07/14/2021 unfortunately the patient today has some evidence of deep tissue injury and pressure getting to the ankle region. Again I am not exactly sure what is going on here but this is very similar to issues that we have had in the past. I explained to the patient that she needs to be very mindful of exactly what is happening I think sleeping in bed is probably the main issue here although there could be other culprits I am not sure what else would potentially lead to this kind of a problem for her. 07/21/2021 upon evaluation today patient's wound actually showing signs of improvement compared to last week. Fortunately there does not appear to be any signs of active infection which is great news and overall very pleased with where things stand in that regard. With that being said I do believe that she is continuing to show signs of overall of getting better although I think this is still basically about what we were 2 weeks ago due to the worsening and now improvement. 07/28/2021 upon evaluation  today patient appears to be doing well with regard to her wound. She does have some slough buildup on the surface of the wound which I would have to manage today. Fortunately there is no sign of active infection at this time. No fevers, chills, nausea, vomiting, or diarrhea. 08/04/2021 upon evaluation today patient appears to be doing about the same in regard to her wound. To be perfectly honest I am beginning to be a little bit concerned about the overall appearance of the wound bed. I do think possibly taking a sample right around the margin of the wound could be beneficial for her as far as identifying anything such as an inflammatory process or to be honest even a skin tag cancer type process that may be of concern here. Fortunately there does not appear to be any evidence of active infection at this time which is great news she is not having any pain also great news. 08/11/2021 upon evaluation today patient appears to be doing well with regard to her wound. The good news is I did review her biopsy results and it showed some inflammatory mixed findings but nothing that appeared to be malignant which is great news. Overall this is more of a chronic venous stasis type issue which again is more what we have been treating. Nonetheless I just wanted to make sure before going forward that there was not anything more untoward going on at this point. 08/18/2021 upon evaluation today patient appears to be doing well with regard to her ankle ulcer. Fortunately there does not appear to be any signs of active infection at this time which is great overall wound is dramatically improved compared to last week. Since last week I have actually placed her on doxycycline and subsequently this is a good option as far as the findings are concerned at this point. I do believe that the positive result of MRSA is definitely something that needed to be addressed and the good news is The doxycycline is doing a good job of doing  this. the doxycycline is doing that. There does not appear to be any evidence of active infection systemically which is great news. 08/25/2021 upon evaluation today patient appears to be doing well with regard to her wound. I feel like we are finally get back on track as far as healing is concerned I am much happier with the overall appearance today. I  do think that she is tolerating the dressing changes without complication which is great news. We have been using Hydrofera Blue which I think is a good option. The good news is she is also doing great in regard to her compression sock on the left which is a zipper compression that seems to be doing a great job keeping her edema under good control. 09/01/2021 upon evaluation today patient appears to be doing well with regard to her wound. Infection seems to be under much better control which is great news and very pleased in that regard. Fortunately there does not appear to be any signs of infection currently. [name], [name] ([id number]) 09/08/2021 upon evaluation today patient actually appears to be making good progress in regard to her wound. She has been tolerating the dressing changes without complication. Fortunately there does not appear to be any evidence of active infection at this time which is great news as well. No fevers, chills, nausea, vomiting, or diarrhea. 09/22/2021 upon evaluation today patient appears to be doing well with regard to her wound although is very slow to heal. We have not looked into Apligraf yet I think that is something that we should see about doing. 09/29/2021 upon evaluation today patient's wound is actually showing signs of doing about the same. I am not seeing any evidence of worsening but also no significant evidence of improvement. We did gain approval for the organogenesis products all except for Apligraf as covered by her insurance. With that being said I do think that we can go ahead and proceed with the  NuShield if the patient and her family in agreement of the plan I discussed that with him today she does have a 20% coinsurance which we also discussed. 10/06/2021 upon evaluation today patient appears to unfortunately be doing a little bit worse she appears to be infected based on what I am seeing. Fortunately there does not appear to be any signs of active infection at this time which is great news. Unfortunately it does appear to be some evidence of around the wound edge indicated by way of erythema and warmth as well as redness Electronic Signature(s) Signed: 10/06/2021 1:25:09 PM By: Worthy Keeler PA-C Entered By: Worthy Keeler on 10/06/2021 13:25:08 Villanueva, [name] ([id number]) -------------------------------------------------------------------------------- Physical Exam Details Patient Name: [name] Date of Service: 10/06/2021 10:15 AM Medical Record Number: [id number] Patient Account Number: [id number] Date of Birth/Sex: December 15, 1922 (87 y.o. F) Treating RN: Carlene Coria Primary Care Provider: Adrian Prows Other Clinician: Referring Provider: Adrian Prows Treating Provider/Extender: Skipper Cliche in Treatment: 50 Constitutional Well-nourished and well-hydrated in no acute distress. Respiratory normal breathing without difficulty. Psychiatric this patient is able to make decisions and demonstrates good insight into disease process. Alert and Oriented x 3. pleasant and cooperative. Notes Upon inspection patient's wound unfortunately appears to show signs of redness and erythema and infection I am concerned here that we need to address that before applying the NuShield which we do have here in the clinic for her unfortunately this is shelf stable we can save it until she is absolutely ready I do not think were quite there yet. Electronic Signature(s) Signed: 10/06/2021 3:05:16 PM By: Worthy Keeler PA-C Entered By: Worthy Keeler on 10/06/2021  15:05:15 Scheid, [name] ([id number]) -------------------------------------------------------------------------------- Physician Orders Details Patient Name: [name] Date of Service: 10/06/2021 10:15 AM Medical Record Number: [id number] Patient Account Number: [id number] Date of Birth/Sex: 06/25/1923 (87 y.o. F) Treating RN: Carlene Coria Primary  Care Provider: Adrian Prows Other Clinician: Referring Provider: Adrian Prows Treating Provider/Extender: Skipper Cliche in Treatment: 20 Verbal / Phone Orders: No Diagnosis Coding ICD-10 Coding Code Description 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, stage 3 I89.0 Lymphedema, not elsewhere classified I87.2 Venous insufficiency (chronic) (peripheral) Z95.0 Presence of cardiac pacemaker Z79.01 Long term (current) use of anticoagulants Follow-up Appointments o Return Appointment in 1 week. Bathing/ Shower/ Hygiene o May shower with wound dressing protected with water repellent cover or cast protector. Edema Control - Lymphedema / Segmental Compressive Device / Other o Optional: One layer of unna paste to top of compression wrap (to act as an anchor). - and at toes o Elevate, Exercise Daily and Avoid Standing for Long Periods of Time. o Elevate legs to the level of the heart and pump ankles as often as possible o Elevate leg(s) parallel to the floor when sitting. Wound Treatment Wound #7 - Malleolus Wound Laterality: Right, Lateral Cleanser: Soap and Water 1 x Per Week/30 Days Discharge Instructions: Gently cleanse wound with antibacterial soap, rinse and pat dry prior to dressing wounds Topical: Mupirocin Ointment 1 x Per Week/30 Days Discharge Instructions: Apply as directed by provider. Primary Dressing: Silvercel Small 2x2 (in/in) 1 x Per Week/30 Days Discharge Instructions: Apply Silvercel Small 2x2 (in/in) as instructed Secondary Dressing: Gauze 1 x Per Week/30 Days Discharge Instructions: As  directed: dry, moistened with saline or moistened with Dakins Solution Compression Wrap: Profore Lite LF 3 Multilayer Compression Bandaging System 1 x Per Week/30 Days Discharge Instructions: Apply 3 multi-layer wrap as prescribed. Laboratory o Bacteria identified in Wound by Culture (MICRO) - non healing wound right ankle with redness and pain - (ICD10 L89.513 - Pressure ulcer of right ankle, stage 3) oooo LOINC Code: 8786-7 oooo Convenience Name: Wound culture routine Patient Medications Allergies: Sulfa (Sulfonamide Antibiotics), metronidazole, monistat Notifications Medication Indication Start End doxycycline hyclate 10/06/2021 DOSE 1 - oral 100 mg capsule - 1 capsule oral taken 2 times per day for 14 days [name], [name] ([id number]) Electronic Signature(s) Signed: 10/06/2021 11:28:09 AM By: Worthy Keeler PA-C Entered By: Worthy Keeler on 10/06/2021 11:28:07 Turley, [name] ([id number]) -------------------------------------------------------------------------------- Problem List Details Patient Name: [name] Date of Service: 10/06/2021 10:15 AM Medical Record Number: [id number] Patient Account Number: [id number] Date of Birth/Sex: 07/03/1923 (87 y.o. F) Treating RN: Carlene Coria Primary Care Provider: Adrian Prows Other Clinician: Referring Provider: Adrian Prows Treating Provider/Extender: Skipper Cliche in Treatment: 20 Active Problems ICD-10 Encounter Code Description Active Date MDM Diagnosis 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, stage 3 05/13/2021 No Yes I89.0 Lymphedema, not elsewhere classified 05/13/2021 No Yes I87.2 Venous insufficiency (chronic) (peripheral) 05/13/2021 No Yes Z95.0 Presence of cardiac pacemaker 05/13/2021 No Yes Z79.01 Long term (current) use of anticoagulants 05/13/2021 No Yes Inactive Problems Resolved Problems Electronic Signature(s) Signed: 10/06/2021 10:37:30 AM By: Worthy Keeler PA-C Entered By: Worthy Keeler on 10/06/2021 10:37:30 [place], [name] ([id number]) -------------------------------------------------------------------------------- Progress Note Details Patient Name: [name] Date of Service: 10/06/2021 10:15 AM Medical Record Number: [id number] Patient Account Number: [id number] Date of Birth/Sex: [birth year] (87 y.o. 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 Dr. Amalia Hailey the podiatrist saw her on 07/20/2017 and sent her to the wound center for possible hyperbaric oxygen therapy. past medical history of peripheral vascular disease, varicose veins, status post appendectomy, basal cell carcinoma excision from the left leg, cholecystectomy, pacemaker placement, right lower extremity angiography done by Dr. dew in March 2017 with placement of a stent. there is also note of a successful ablation of the right small saphenous vein done which was reviewed by ultrasound on 10/24/2016. the patient had a right small saphenous vein ablation done on 10/20/2016. The patient has never been a smoker. She has been seen by Dr. Corene Cornea dew the vascular surgeon who most recently saw her on 06/15/2017 for evaluation of ongoing problems with right leg swelling. She had a lower extremity arterial duplex examination done(02/13/17) which showed patent distal right superficial femoral artery stent and above-the-knee popliteal stent without evidence of restenosis. The ABI was more than 1.3 on the right and more than 1.3 on the left. This was consistent with  noncompressible arteries due to medial calcification. The right great toe pressure and PPG waveforms are within normal limits and the left great toe pressure and PPG waveforms are decreased. he recommended she continue to wear her compression stockings and continue with elevation. She is scheduled to have a noninvasive arterial study in the near future 08/16/2017 -- had a lower extremity arterial duplex examination done which showed patent distal right superficial femoral artery stent and above-the-knee popliteal stent without evidence of restenosis. The ABI was more than 1.3 on the right and more than 1.3 on the left. This was consistent with noncompressible arteries due to medial calcification. The right great toe pressure and PPG waveforms are within normal limits and the left great toe pressure and PPG waveforms are decreased. the x-ray of the right ankle has not yet been done 08/24/2017 -- had a right ankle x-ray -- IMPRESSION:1. No fracture, bone lesion or evidence of osteomyelitis. 2. Lateral soft tissue swelling with a soft tissue ulcer. she has not yet seen the vascular surgeon for review 08/31/17 on evaluation today patient's wound appears to be showing signs of improvement. She still with her appointment with vascular in order to review her results of her vascular study and then determine if any intervention would be recommended at that time. No fevers, chills, nausea, or vomiting noted at this time. She has been tolerating the dressing changes without complication. 09/28/17 on evaluation today patient's wound appears to show signs of good improvement in regard to the granulation tissue which is surfacing. There is still a layer of slough covering the wound and the posterior portion is still significantly deeper than the anterior nonetheless there has been some good sign of things moving towards the better. She is going to go back to Dr. dew for reevaluation to ensure her blood flow is  still appropriate. That will be before her next evaluation with [place] next week. No fevers, chills, nausea, or vomiting noted at this time. Patient does have some discomfort rated to be a 3-4/10 depending on activity specifically cleansing the wound makes it worse. 10/05/2017 -- the patient was seen by Dr. Lucky Cowboy last week and noninvasive studies showed a normal right ABI with brisk triphasic waveforms consistent with no arterial insufficiency including normal digital pressures. The duplex showed a patent distal right SFA stent and the proximal SFA was also normal. He was pleased with her test and thought she should have enough of perfusion for normal wound healing. He  would see her back in 6 months time. 12/21/17 on evaluation today patient appears to be doing fairly well in regard to her right lateral ankle wound. Unfortunately the main issue that she is expansion at this point is that she is having some issues with what appears to be some cellulitis in the right anterior shin. She has also been noting a little bit of uncomfortable feeling especially last night and her ankle area. I'm afraid that she made the developing a little bit of an infection. With that being said I think it is in the early stages. 12/28/17 on evaluation today patient's ankle appears to be doing excellent. She's making good progress at this point the cellulitis seems to have improved after last week's evaluation. Overall she is having no significant discomfort which is excellent news. She does have an appointment with Dr. dew on March 29, 2018 for reevaluation in regard to the stent he placed. She seems to have excellent blood flow in the right lower extremity. 01/19/12 on evaluation today patient's wound appears to be doing very well. In fact she does not appear to require debridement at this point, there's no evidence of infection, and overall from the standpoint of the wound she seems to be doing very well. With that being said I  believe that it may be time to switch to different dressing away from the [facility] Dressing she tells me she does have a lot going on her friend actually passed away yesterday and she's also having a lot of issues with her husband this obviously is weighing heavy on her as far as your thoughts and concerns today. 01/25/18 on evaluation today patient appears to be doing fairly well in regard to her right lateral malleolus. She has been tolerating the dressing changes without complication. Overall I feel like this is definitely showing signs of improvement as far as how the overall appearance of the wound is there's also evidence of epithelium start to migrate over the granulation tissue. In general I think that she is progressing nicely as far as the wound is concerned. The only concern she really has is whether or not we can switch to every other week visits in order to avoid having as many SHIRELL, STRUTHERS ([id number]) appointments as her daughters have a difficult time getting her to her appointments as well as the patient's husband to his he is not doing very well at this point. 02/22/18 on evaluation today patient's right lateral malleolus ulcer appears to be doing great. She has been tolerating the dressing changes without complication. Overall you making excellent progress at this time. Patient is having no significant discomfort. 03/15/18 on evaluation today patient appears to be doing much more poorly in regard to her right lateral ankle ulcer at this point. Unfortunately since have last seen her her husband has passed just a few days ago is obviously weighed heavily on her her daughter also had surgery well she is with her today as usual. There does not appear to be any evidence of infection she does seem to have significant contusion/deep tissue injury to the right lateral malleolus which was not noted previous when I saw her last. It's hard to tell of exactly when this injury occurred  although during the time she was spending the night in the hospital this may have been most likely. 03/22/18 on evaluation today patient appears to actually be doing very well in regard to her ulcer. She did unfortunately have a setback which was noted  last week however the good news is we seem to be getting back on track and in fact the wound in the core did still have some necrotic tissue which will be addressed at this point today but in general I'm seeing signs that things are on the up and up. She is glad to hear this obviously she's been somewhat concerned that due to the how her wound digressed more recently. 03/29/18 on evaluation today patient appears to be doing fairly well in regard to her right lower extremity lateral malleolus ulcer. She unfortunately does have a new area of pressure injury over the inferior portion where the wound has opened up a little bit larger secondary to the pressure she seems to be getting. She does tell me sometimes when she sleeps at night that it actually hurts and does seem to be pushing on the area little bit more unfortunately. There does not appear to be any evidence of infection which is good news. She has been tolerating the dressing changes without complication. She also did have some bruising in the left second and third toes due to the fact that she may have bump this or injured it although she has neuropathy so she does not feel she did move recently that may have been where this came from. Nonetheless there does not appear to be any evidence of infection at this time. 04/12/18 on evaluation today patient's wound on the right lateral ankle actually appears to be doing a little bit better with a lot of necrotic docking tissue centrally loosening up in clearing away. However she does have the beginnings of a deep tissue injury on the left lateral malleolus likely due to the fact we've been trying offload the right as much as we have. I think she may  benefit from an assistive soft device to help with offloading and it looks like they're looking at one of the doughnut conditions that wraps around the lower leg to offload which I think will definitely do a good job. With that being said I think we definitely need to address this issue on the left before it becomes a wound. Patient is not having significant pain. 04/19/18 on evaluation today patient appears to be doing excellent in regard to the progress she's made with her right lateral ankle ulcer. The left ankle region which did show evidence of a deep tissue injury seems to be resolving there's little fluid noted underneath and a blister there's nothing open at this point in time overall I feel like this is progressing nicely which is good news. She does not seem to be having significant discomfort at this point which is also good news. 04/25/18-She is here in follow up evaluation for bilateral lateral malleolar ulcers. The right lateral malleolus ulcer with pale subcutaneous tissue exposure, central area of ulcer with tendon/periosteum exposed. The left lateral malleolus ulcer now with central area of nonviable tissue, otherwise deep tissue injury. She is wearing compression wraps to the left lower extremity, she will place the right lower extremity compression wraps on when she gets home. She will be out of town over the weekend and return next week and follow-up appointment. She completed her doxycycline this morning 05/03/18 on evaluation today patient appears to be doing very well in regard to her right lateral ankle ulcer in general. At least she's showing some signs of improvement in this regard. Unfortunately she has some additional injury to the left lateral malleolus region which appears to be new likely even  over the past several days. Again this determination is based on the overall appearance. With that being said the patient is obviously frustrated about this currently. 05/10/18-She is  here in follow-up evaluation for bilateral lateral malleolar ulcers. She states she has purchased offloading shoes/boots and they will arrive tomorrow. She was asked to bring them in the office at next week's appointment so her provider is aware of product being utilized. She continues to sleep on right or left side, she has been encouraged to sleep on her back. The right lateral malleolus ulcer is precariously close to peri-osteum; will order xray. The left lateral malleolus ulcer is improved. Will switch back to santyl; she will follow up next week. 05/17/18 on evaluation today patient actually appears to be doing very well in regard to her malleolus her ulcers compared to last time I saw them. She does not seem to have as much in the way of contusion at this point which is great news. With that being said she does continue to have discomfort and I do believe that she is still continuing to benefit from the offloading/pressure reducing boots that were recommended. I think this is the key to trying to get this to heal up completely. 05/24/18 on evaluation today patient actually appears to be doing worse at this point in time unfortunately compared to her last week's evaluation. She is having really no increased pain which is good news unfortunately she does have more maceration in your theme and noted surrounding the right lateral ankle the left lateral ankle is not really is erythematous I do not see signs of the overt cellulitis on that side. Unfortunately the wounds do not seem to have shown any signs of improvement since the last evaluation. She also has significant swelling especially on the right compared to previous some of this may be due to infection however also think that she may be served better while she has these wounds by compression wrapping versus continuing to use the Juxta-Lite for the time being. Especially with the amount of drainage that she is experiencing at this point. No  fevers, chills, nausea, or vomiting noted at this time. 05/31/18 on evaluation today patient appears to actually be doing better in regard to her right lateral lower extremity ulcer specifically on the malleolus region. She has been tolerating the antibiotic without complication. With that being said she still continues to have issues but a little bit of redness although nothing like she what she was experiencing previous. She still continues to pressure to her ankle area she did get the problem on offloading boots unfortunately she will not wear them she states there too uncomfortable and she can't get in and out of the bed. Nonetheless at this point her wounds seem to be continually getting worse which is not what we want I'm getting somewhat concerned about her progress and how things are going to proceed if we do not intervene in some way shape or form. I therefore had a very lengthy conversation today about offloading yet again and even made a specific suggestion for switching her to a memory foam mattress and even gave the information for a specific one that they could look at getting if it was something that they were interested in considering. She does not want to be considered for a hospital bed air mattress although honestly insurance would not cover it that she does not have any wounds on her trunk. 06/14/18 on evaluation today both wounds over the bilateral lateral  malleolus her ulcers appear to be doing better there's no evidence of pressure injury at this point. She did get the foam mattress for her bed and this does seem to have been extremely beneficial for her in my pinion. Her daughter states that she is having difficulty getting out of bed because of how soft it is. The patient also relates this to be. Nonetheless I do feel like she's actually doing better. Unfortunately right after and around the time she was getting the mattress she also sustained a fall when she got up to go pick up  the phone and ended up injuring her right elbow she has 18 sutures in place. We are not caring for this currently although home health is going to be taking the sutures out shortly. Nonetheless this may be something that we need to evaluate going forward. It depends on ADAIR, LEMAR ([id number]) how well it has or has not healed in the end. She also recently saw an orthopedic specialist for an injection in the right shoulder just before her fall unfortunately the fall seems to have worsened her pain. 06/21/18 on evaluation today patient appears to be doing about the same in regard to her lateral malleolus ulcers. Both appear to be just a little bit deeper but again we are clinging away the necrotic and dead tissue which I think is why this is progressing towards a deeper realm as opposed to improving from my measurement standpoint in that regard. Nonetheless she has been tolerating the dressing changes she absolutely hates the memory foam mattress topper that was obtained for her nonetheless I do believe this is still doing excellent as far as taking care of excess pressure in regard to the lateral malleolus regions. She in fact has no pressure injury that I see whereas in weeks past it was week by week I was constantly seeing new pressure injuries. Overall I think it has been very beneficial for her. 07/03/18; patient arrives in my clinic today. She has deep punched out areas over her bilateral lateral malleoli. The area on the right has some more depth. We spent a lot of time today talking about pressure relief for these areas. This started when her daughter asked for a prescription for a memory foam mattress. I have never written a prescription for a mattress and I don't think insurances would pay for that on an ordinary bed. In any case he came up that she has foam boots that she refuses to wear. I would suggest going to these before any other offloading issues when she is in bed. They say she  is meticulous about offloading this the rest of the day 07/10/18- She is seen in follow-up evaluation for bilateral, lateral malleolus ulcers. There is no improvement in the ulcers. She has purchased and is sleeping on a memory foam mattress/overlay, she has been using the offloading boots nightly over the past week. She has a follow up appointment with vascular medicine at the end of October, in my opinion this follow up should be expedited given her deterioration and suboptimal TBI results. We will order plain film xray of the left ankle as deeper structures are palpable; would consider having MRI, regardless of xray report(s). The ulcers will be treated with iodoflex/iodosorb, she is unable to safely change the dressings daily with santyl. 07/19/18 on evaluation today patient appears to be doing in general visually well in regard to her bilateral lateral malleolus ulcers. She has been tolerating the dressing changes without complication  which is good news. With that being said we did have an x-ray performed on 07/12/18 which revealed a slight loosen see in the lateral portion of the distal left fibula which may represent artifact but underline lytic destruction or osteomyelitis could not be excluded. MRI was recommended. With that being said we can see about getting the patient scheduled for an MRI to further evaluate this area. In fact we have that scheduled currently for August 20 19,019. 07/26/18 on evaluation today patient's wound on the right lateral ankle actually appears to be doing fairly well at this point in my pinion. She has made some good progress currently. With that being said unfortunately in regard to the left lateral ankle ulcer this seems to be a little bit more problematic at this time. In fact as I further evaluated the situation she actually had bone exposed which is the first time that's been the case in the bone appear to be necrotic. Currently I did review patient's note from Dr.  Bunnie Domino office with  [facility]. He stated that ABI was 1.26 on the right and 0.95 on the left with good waveforms. Her perfusion is stable not reduced from previous studies and her digital waveforms were pretty good particularly on the right. His conclusion upon review of the note was that there was not much she could do to improve her perfusion and he felt she was adequate for wound healing. His suggestion was that she continued to see [place] and consider a synthetic skin graft if there was no underlying infection. He plans to see her back in six months or as needed. 08/01/18 on evaluation today patient appears to be doing better in regard to her right lateral ankle ulcer. Her left lateral ankle ulcer is about the same she still has bone involvement in evidence of necrosis. There does not appear to be evidence of infection at this time On the right lateral lower extremity. I have started her on the Augmentin she picked this up and started this yesterday. This is to get her through until she sees infectious disease which is scheduled for 08/12/18. 08/06/18 on evaluation today patient appears to be doing rather well considering my discussion with patient's daughter at the end of last week. The area which was marked where she had erythema seems to be improved and this is good news. With that being said overall the patient seems to be making good improvement when it comes to the overall appearance of the right lateral ankle ulcer although this has been slow she at least is coming around in this regard. Unfortunately in regard to the left lateral ankle ulcer this is osteomyelitis based on the pathology report as well is bone culture. Nonetheless we are still waiting CT scan. Unfortunately the MRI we originally ordered cannot be performed as the patient is a pacemaker which I had overlooked. Nonetheless we are working on the CT scan approval and scheduling as of now. She did go to the  hospital over the weekend and was placed on IV Cefzo for a couple of days. Fortunately this seems to have improved the erythema quite significantly which is good news. There does not appear to be any evidence of worsening infection at this time. She did have some bleeding after the last debridement therefore I did not perform any sharp debridement in regard to left lateral ankle at this point. Patient has been approved for a snap vac for the right lateral ankle. 08/14/18; the patient with  wounds over her bilateral lateral malleoli. The area on the right actually looks quite good. Been using a snap back on this area. Healthy granulation and appears to be filling in. Unfortunately the area on the left is really problematic. 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 Also noted to have cellulitis. She saw Dr. Novella Olive of infectious disease today and was put on linezolid. We are able to verify this with her pharmacy. She is completed the Augmentin that she was already on. We've been using Iodoflex to this area 08/23/18 on evaluation today patient's wounds both actually appear to be doing better compared to my prior evaluations. Fortunately she showing signs of good improvement in regard to the overall wound status especially where were using the snap vac on the right. In regard to left lateral malleolus the wound bed actually appears to be much cleaner than previously noted. I do not feel any phone directly probed during evaluation today and though there is tendon noted this does not appear to be necrotic it's actually fairly good as far as the overall appearance of the tendon is concerned. In general the wound bed actually appears to be doing significantly better than it was previous. Patient is currently in the care of [name] and I did review that note today. He actually has her on two weeks of linezolid and then following  the patient will be on 1-2 months of Keflex. That is the plan currently. She has been on antibiotics therapy as prescribed by myself initially starting on July 30, 2018 and has been on that continuously up to this point. 08/30/18 on evaluation today patient actually appears to be doing much better in regard to her right lateral malleolus ulcer. She has been tolerating the dressing changes specifically the snap vac without complication although she did have some issues with the seal currently. Apparently there was some trouble with getting it to maintain over the past week past Sunday. Nonetheless overall the wound appears better in regard to the right lateral malleolus region. In regard to left lateral malleolus this actually show some signs of additional granulation although there still tendon noted in the base of the wound this appears to be healthy not necrotic in any way whatsoever. We are considering potentially using a snap vac for the left lateral malleolus as well the product wrap from [employer], [place], was present in the clinic today we're going to see this patient I did have her come in with me after obtaining consent from the patient and her daughter in order to look at the wound and see if there's any recommendation one way or another as to whether or not they felt the snapback could be beneficial for the left lateral malleolus region. But [name], [name] ([id number]) the conclusion was that it might be but that this is definitely a little bit deeper wound than what traditionally would be utilized for a snap vac. 09/06/18 on evaluation today patient actually appears to be doing excellent in my pinion in regard to both ankle ulcers. She has been tolerating the dressing changes without complication which is great news. Specifically we have been using the snap vac. In regard to the right ankle I'm not even sure that this is going to be necessary for today and following as the wound has filled  in quite nicely. In regard to the left ankle I do believe that we're seeing excellent epithelialization  from the edge as well as granulation in the central portion the tendon is still exposed but there's no evidence of necrotic bone and in general I feel like the patient has made excellent progress even compared to last week with just one week of the snap vac. 09/11/18; this is a patient who has wounds on her bilateral lateral malleoli. Initially both of these were deep stage IV wounds in the setting of chronic arterial insufficiency. She has been revascularized. As I understand think she been using snap vacs to both of these wounds however the area on the right became more superficial and currently she is only using it on the left. Using silver collagen on the right and silver collagen under the back on the left I believe 09/19/18 on evaluation today patient actually appears to be doing very well in regard to her lateral malleolus or ulcers bilaterally. She has been tolerating the dressing changes without complication. Fortunately there does not appear to be any evidence of infection at this time. Overall I feel like she is improving in an excellent manner and I'm very pleased with the fact that everything seems to be turning towards the better for her. This has obviously been a long road. 09/27/18 on evaluation today patient actually appears to be doing very well in regard to her bilateral lateral malleolus ulcers. She has been tolerating the dressing changes without complication. Fortunately there does not appear to be any evidence of infection at this time which is also great news. No fevers, chills, nausea, or vomiting noted at this time. Overall I feel like she is doing excellent with the snap vac on the left malleolus. She had 40 mL of fluid collection over the past week. 10/04/18 on evaluation today patient actually appears to be doing well in regard to her bilateral lateral malleolus ulcers.  She continues to tolerate the dressing changes without complication. One issue that I see is the snap vac on the left lateral malleolus which appears to have sealed off some fluid underlying this area and has not really allowed it to heal to the degree that I would like to see. For that reason I did suggest at this point we may want to pack a small piece of packing strip into this region to allow it to more effectively wick out fluid. 10/11/18 in general the patient today does not feel that she has been doing very well. She's been a little bit lethargic and subsequently is having bodyaches as well according to what she tells me today. With that being said overall she has been concerned with the fact that something may be worsening although to be honest her wounds really have not been appearing poorly. She does have a new ulcer on her left heel unfortunately. This may be pressure related. Nonetheless it seems to me to have potentially started at least as a blister I do not see any evidence of deep tissue injury. In regard to the left ankle the snap vac still seems to be causing the ceiling off of the deeper part of the wound which is in turn trapping fluid. I'm not extremely pleased with the overall appearance as far as progress from last week to this week therefore I'm gonna discontinue the snap vac at this point. 10/18/18 patient unfortunately this point has not been feeling well for the past several days. She was seen by Grayland Ormond her primary care provider who is a Librarian, academic at [name]. Subsequently she states that she's  been very weak and generally feeling malaise. No fevers, chills, nausea, or vomiting noted at this time. With that being said bloodwork was performed at the PCP office on the 11th of this month which showed a white blood cell count of 10.7. This was repeated today and shows a white blood cell count of 12.4. This does show signs of worsening. Coupled with the  fact that she is feeling worse and that her left ankle wound is not really showing signs of improvement I feel like this is an indication that the osteomyelitis is likely exacerbating not improving. Overall I think we may also want to check her C-reactive protein and sedimentation rate. Actually did call [facility] office this afternoon while the patient was in the office here with me. Subsequently based on the findings we discussed treatment possibilities and I think that it is appropriate for [place] to go ahead and initiate treatment with doxycycline which I'm going to do. Subsequently he did agree to see about adding a CRP and sedimentation rate to her orders. If that has not already been drawn to where they can run it they will contact the patient she can come back to have that check. They are in agreement with plan as far as the patient and her daughter are concerned. Nonetheless also think we need to get in touch with Dr. Henreitta Leber office to see about getting the patient scheduled with him as soon as possible. 11/08/18 on evaluation today patient presents for follow-up concerning her bilateral foot and ankle ulcers. I did do an extensive review of her chart in epic today. Subsequently she was seen by [name] he did initiate Cefepime IV antibiotic therapy. Subsequently she had some issues with her PICC line this had to be removed because it was coiled and then replaced. Fortunately that was now settled. Unfortunately she has continued have issues with her left heel as well as the issues that she is experiencing with her bilateral lateral malleolus regions. I do believe however both areas seem to be doing a little bit better on evaluation today which is good news. No fevers, chills, nausea, or vomiting noted at this time. She actually has an angiogram schedule with Dr. dew on this coming Monday, November 11, 2018. Subsequently the patient states that she is feeling much better especially than what  she was roughly 2 weeks ago. She actually had to cancel an appointment because she was feeling so poorly. No fevers, chills, nausea, or vomiting noted at this time. 11/15/18 on evaluation today patient actually is status post having had her angiogram with Dr. dew Monday, four days ago. 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 He had to go and work on several areas of the vasculature fortunately he was able to obtain no more than a 30% residual stenosis throughout post procedure. I reviewed this note today. I think this will definitely help with healing at this time. Fortunately there does not appear to be any signs of infection and I do feel like ratio already has a better appearance to it. 11/22/18 upon evaluation today patient actually appears to be doing very well in regard to her wounds in general. The right lateral malleolus looks excellent the heel looks better in the left lateral malleolus also appears to be doing a little better. With that being said the right second toe actually appears to be open and training we been watching this is been dry and stable but now  is open. 12/03/2018 Seen today for follow-up and management of multiple bilateral lower extremity wounds. New pressure injury of the great toe which is closed at this time. Wound of the right distal second toe appears larger today with deep undermining and a pocket of fluid present within the undermining region. Left and right malleolus is wounds are stable today with no signs and symptoms of infection.Denies any needs or concerns during exam today. 12/13/18 on evaluation today patient appears to be doing somewhat better in regard to her left heel ulcer. She also seems to be completely healed in regard to the right lateral malleolus ulcer. The left malleolus ulcer is smaller what unfortunately the wounds which are new over the first and second toes of the right foot are what are most concerning at this point  especially the second. Both areas did require sharp debridement today. 12/20/18 on evaluation today patient's wound actually appears to be doing better in regard to left lateral ankle and her right lateral ankle continues to remain healed. The hill ulcer on the left is improved. She does have improvement noted as well in regard to both toe ulcers. Overall I'm very pleased in this regard. No fevers, chills, nausea, or vomiting noted at this time. [name], [name] ([id number]) 12/23/18 on evaluation today patient is seen after she had her toenails trimmed at the podiatrist office due to issues with her right great toe. There was what appeared to be dark eschar on the surface of the wound which had her in the podiatrist concerned. Nonetheless as I remember that during the last office visit I had utilize silver nitrate of this area I was much less concerned about the situation. Subsequently I was able to clean off much of this tissue without any complication today. This does not appear to show any signs of infection and actually look somewhat better compared to last time post debridement. Her second toe on the right foot actually had callous over and there did appear still be some fluid underneath this that would require debridement today. 12/27/18 on evaluation today patient actually appears to be showing signs of improvement at all locations. Even the left lateral ankle although this is not quite as great as the other sites. Fortunately there does not appear to be any signs of infection at this time and both of her toes on the right foot seem to be showing signs of improvement which is good news and very pleased in this regard. 01/03/19 on evaluation today patient appears to be doing better for the most part in regard to her wounds in particular. There does not appear to be any evidence of infection at this time which is good news. Fortunately there is no sign of really worsening anywhere except for the  right great toe which she does have what appears to be a bruise/deep tissue injury which is very superficial and already resolving. I'm not sure where this came from I questioned her extensively and she does not recall what may have happened with this. Other than that the patient seems to be doing well even the left lateral ankle ulcer looks good and is getting smaller. 01/10/19 on evaluation today patient appears to be doing well in regard to her left heel wound and both of her toe wounds. Overall I feel like there is definitely improvement here and I'm happy in that regard. With that being said unfortunately she is having issues with the left lateral malleolus ulcer which unfortunately still has a lot  of depth to it. This is gonna be a very difficult wound for [place] to be able to truly get to heal. I may want to consider some type of skin substitute to see if this would be of benefit for her. I'll discuss this with her more the next visit most likely. This was something I thought about more at the end of the visit when I was Artie out of the room and the patient had been discharged. 01/17/19 on evaluation today patient appears to be doing very well in regard to her wounds in general. She's been making excellent progress at this time. Fortunately there's no sign of infection at this time either. No fevers, chills, nausea, or vomiting noted at this time. The biggest issue is still her left lateral malleolus where it appears to be doing well and is getting smaller but still shows a small corner where this is deeper and goes down into what appears to be the joint space. Nonetheless this is taking much longer to heal although it still looks better in smaller than previous evaluations. 01/24/19 on evaluation today patient's wounds actually appear to be doing rather well in general overall. She did require some sharp debridement in regard to the right great toe but everything else appears to be doing excellent no  debridement was even necessary. No fevers, chills, nausea, or vomiting noted at this time. 01/31/19 on evaluation today patient actually appears to be doing much better in regard to her left foot wound on the heel as well as the ankle. The right great toe appears to be a little bit worse today this had callous over and trapped a lot of fluid underneath. Fortunately there's no signs of infection at any site which is great news. 02/07/19 on evaluation today patient actually appears to be doing decently well in regard to all of her ulcers at this point. No sharp debridement was required she is a little bit of hyper granulation in regard to the left lateral ankle as well as the left heel but the hill itself is almost completely healed which is excellent news. Overall been very pleased in this regard. 02/14/19 on evaluation today patient actually appears to be doing very well in regard to her ulcers on the right first toe, left lateral malleolus, and left heel. In fact the heel is almost completely healed at this point. The patient does not show any signs of infection which is good news. Overall very pleased with how things have progressed. 04/18/19 Telehealth Evaluation During the COVID-19 National Emergency: Verbal Consent: Obtained from patient Allergies: reviewed and the active list is current. Medication changes: patient has no current medication changes. COVID-19 Screening: 1. Have you traveled internationally or on a cruise ship in the last 14 dayso No 2. Have you had contact with someone with or under investigation for COVID-19o No 3. Have you had a fever, cough, sore throat, or experiencing shortness of breatho No on evaluation today actually did have a visit with this patient through a telehealth encounter with her home health nurse. Subsequently it was noted that the patient actually appears to be doing okay in regard to her wounds both the right great toe as well as the left lateral malleolus  have shown signs of improvement although this in your theme around the left lateral malleolus there eschar coverings for both locations. The question is whether or not they are actually close and whether or not home health needs to discharge the patient or not. Nonetheless  my concern is this point obviously is that without actually seeing her and being able to evaluate this directly I cannot ensure that she is completely healed which is the question that I'm being asked. 04/22/19 on evaluation today patient presents for her first evaluation since last time I saw her which was actually February 14, 2019. I did do a telehealth visit last week in which point it was questionable whether or not she may be healed and had to bring her in today for confirmation. With that being said she does seem to be doing quite well at this point which is good news. There does not appear to be any drainage in the deed I believe her wounds may be healed. Readmission: 09/04/2019 on evaluation today patient appears to be doing unfortunately somewhat more poorly in regard to her left foot ulcer secondary to a wound that began on 08/21/2019 at least when she first noticed this. Fortunately she has not had any evidence of active infection at this time. Systemically. I also do not necessarily see any evidence of infection at the blister/wound site on the first metatarsal head plantar aspect. This almost appears to be something that may have just rubbed inappropriately causing this to breakdown. They did not want a wait too long to come in to be seen as again she had significant issues in the past with wounds that took quite a while to heal in fact it was close to 2 years. Nonetheless this does not appear to be quite that bad but again we do need to remove some of the necrotic tissue from the surface of the wound to tell exactly the extent. She does not appear to have any significant arterial disease at this point and again her last  ABIs and TBI's are recorded above in the alert section her left ABI was 1.27 with a TBI of 0.72 to the right ABI 1.08 with a TBI of 0.39. Other than this the patient has been doing quite [name], [name] ([id number]) well since I last saw her and that was in May 2020. 09/11/2019 on evaluation today patient appeared to be doing very well with regard to her plantar foot ulcer on the left. In fact this appears to be almost completely healed which is awesome. That is after just 1 week of intervention. With that being said there is no signs of active infection at this time. 09/18/2019 on evaluation today patient actually appears to be doing excellent in fact she is completely healed based on what I am seeing at this point. Fortunately there is no signs of active infection at this time and overall patient is very pleased to hear that this area has healed so quickly. Readmission: 05/13/2021 upon evaluation today this patient presents for reevaluation here in the clinic. This is a wound that actually we previously took care of. She had 1 on the right ankle and the left the left turned out to be be harder due to to heal but nonetheless is doing great at this point as the right that has reopened and it was noted first just several weeks ago with a scab over it and came off in just the past few days. Fortunately there does not appear to be any obvious evidence of significant active infection at this time which is great news. No fevers, chills, nausea, vomiting, or diarrhea. 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 There does not appear to be any signs  of this interfering in any way with her wound. She does have swelling we previously had compression socks for her ordered but again it does not look like she wears these on a regular basis by any means. 05/26/2021 upon evaluation today patient appears to be doing well with regard to her wound which is actually  showing signs of excellent improvement. There does not appear to be any signs of active infection which is great news and overall very pleased with where things stand today. No fevers, chills, nausea, vomiting, or diarrhea. 06/02/2021 upon evaluation today patient's wound actually showing signs of excellent improvement. Fortunately there does not appear to be any signs of active infection which is great news. I think the patient is making good progress with regard to her wounds in general. 06/09/2021 upon evaluation today patient appears to be doing excellent in regard to her wounds currently. Fortunately there does not appear to be any signs of active infection which is great news. No fevers, chills, nausea, vomiting, or diarrhea. Overall extremely pleased with where things stand today. I think the patient is making excellent progress. 06/16/2021 upon evaluation today patient appears to be doing well in regard to her wound. This is going require little bit of debridement today and that was discussed with the patient. Otherwise she seems to be doing quite well and I am actually very pleased with where things stand at this point. No fevers, chills, nausea, vomiting, or diarrhea. 06/23/2021 upon evaluation today patient appears to be doing well with regard to her wounds. She has been tolerating the dressing changes without complication. Fortunately there does not appear to be any evidence of infection and she has not had air in her home which she actually lives at an assisted living that got fixed this morning. With that being said because of that her wrap has been extremely hot and bothersome for her over the past week. 06/30/2021 upon evaluation today patient is actually making excellent progress in regard to her ankle ulcer. She has been tolerating the dressing changes without complication and overall extremely pleased with where things stand there does not appear to be any evidence of active  infection which is great news. No fevers, chills, nausea, vomiting, or diarrhea. 07/07/21 upon evaluation today patients and culture on the right actually appears to be doing quite well. There does not appear to be any signs of infection and overall very pleased with where things stand today. No fevers, chills, nausea, or vomiting noted at this time. 07/14/2021 unfortunately the patient today has some evidence of deep tissue injury and pressure getting to the ankle region. Again I am not exactly sure what is going on here but this is very similar to issues that we have had in the past. I explained to the patient that she needs to be very mindful of exactly what is happening I think sleeping in bed is probably the main issue here although there could be other culprits I am not sure what else would potentially lead to this kind of a problem for her. 07/21/2021 upon evaluation today patient's wound actually showing signs of improvement compared to last week. Fortunately there does not appear to be any signs of active infection which is great news and overall very pleased with where things stand in that regard. With that being said I do believe that she is continuing to show signs of overall of getting better although I think this is still basically about what we were 2 weeks ago  due to the worsening and now improvement. 07/28/2021 upon evaluation today patient appears to be doing well with regard to her wound. She does have some slough buildup on the surface of the wound which I would have to manage today. Fortunately there is no sign of active infection at this time. No fevers, chills, nausea, vomiting, or diarrhea. 08/04/2021 upon evaluation today patient appears to be doing about the same in regard to her wound. To be perfectly honest I am beginning to be a little bit concerned about the overall appearance of the wound bed. I do think possibly taking a sample right around the margin of the wound could be  beneficial for her as far as identifying anything such as an inflammatory process or to be honest even a skin tag cancer type process that may be of concern here. Fortunately there does not appear to be any evidence of active infection at this time which is great news she is not having any pain also great news. 08/11/2021 upon evaluation today patient appears to be doing well with regard to her wound. The good news is I did review her biopsy results and it showed some inflammatory mixed findings but nothing that appeared to be malignant which is great news. Overall this is more of a chronic venous stasis type issue which again is more what we have been treating. Nonetheless I just wanted to make sure before going forward that there was not anything more untoward going on at this point. 08/18/2021 upon evaluation today patient appears to be doing well with regard to her ankle ulcer. Fortunately there does not appear to be any signs of active infection at this time which is great overall wound is dramatically improved compared to last week. Since last week I have actually placed her on doxycycline and subsequently this is a good option as far as the findings are concerned at this point. 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 the doxycycline is doing that. There does not appear to be any evidence of active infection systemically which is great news. 08/25/2021 upon evaluation today patient appears to be doing well with regard to her wound. I feel like we are finally get back on track as far as healing is concerned I am much happier with the overall appearance today. I do think that she is tolerating the dressing changes without complication which is great news. We have been using Hydrofera Blue which I think is a good option. The good news is she is also doing great in [place],  [name]. ([id number]) regard to her compression sock on the left which is a zipper compression that seems to be doing a great job keeping her edema under good control. 09/01/2021 upon evaluation today patient appears to be doing well with regard to her wound. Infection seems to be under much better control which is great news and very pleased in that regard. Fortunately there does not appear to be any signs of infection currently. 09/08/2021 upon evaluation today patient actually appears to be making good progress in regard to her wound. She has been tolerating the dressing changes without complication. Fortunately there does not appear to be any evidence of active infection at this time which is great news as well. No fevers, chills, nausea, vomiting, or diarrhea. 09/22/2021 upon evaluation today patient appears to be doing well  with regard to her wound although is very slow to heal. We have not looked into Apligraf yet I think that is something that we should see about doing. 09/29/2021 upon evaluation today patient's wound is actually showing signs of doing about the same. I am not seeing any evidence of worsening but also no significant evidence of improvement. We did gain approval for the organogenesis products all except for Apligraf as covered by her insurance. With that being said I do think that we can go ahead and proceed with the NuShield if the patient and her family in agreement of the plan I discussed that with him today she does have a 20% coinsurance which we also discussed. 10/06/2021 upon evaluation today patient appears to unfortunately be doing a little bit worse she appears to be infected based on what I am seeing. Fortunately there does not appear to be any signs of active infection at this time which is great news. Unfortunately it does appear to be some evidence of around the wound edge indicated by way of erythema and warmth as well as  redness Objective Constitutional Well-nourished and well-hydrated in no acute distress. Vitals Time Taken: 10:44 AM, Height: 62 in, Weight: 150 lbs, BMI: 27.4, Temperature: 98.3 F, Pulse: 71 bpm, Respiratory Rate: 18 breaths/min, Blood Pressure: 144/81 mmHg. Respiratory normal breathing without difficulty. Psychiatric this patient is able to make decisions and demonstrates good insight into disease process. Alert and Oriented x 3. pleasant and cooperative. General Notes: Upon inspection patient's wound unfortunately appears to show signs of redness and erythema and infection I am concerned here that we need to address that before applying the NuShield which we do have here in the clinic for her unfortunately this is shelf stable we can save it until she is absolutely ready I do not think were quite there yet. Integumentary (Hair, Skin) Wound #7 status is Open. Original cause of wound was Gradually Appeared. The date acquired was: 05/12/2021. The wound has been in treatment 20 weeks. The wound is located on the Right,Lateral Malleolus. The wound measures 0.8cm length x 1cm width x 0.3cm depth; 0.628cm^2 area and 0.188cm^3 volume. There is Fat Layer (Subcutaneous Tissue) exposed. There is no tunneling or undermining noted. There is a medium amount of serosanguineous drainage noted. The wound margin is distinct with the outline attached to the wound base. There is medium (34-66%) red, pink, pale granulation within the wound bed. There is a medium (34-66%) amount of necrotic tissue within the wound bed. Assessment Active Problems ICD-10 Pressure ulcer of right ankle, stage 3 Lymphedema, not elsewhere classified Venous insufficiency (chronic) (peripheral) Presence of cardiac pacemaker Long term (current) use of anticoagulants Stuck, [name] ([id number]) Procedures Wound #7 Pre-procedure diagnosis of Wound #7 is a Pressure Ulcer located on the Right,Lateral Malleolus . There was a Three  Layer Compression Therapy Procedure by Carlene Coria, RN. Post procedure Diagnosis Wound #7: Same as Pre-Procedure Plan Follow-up Appointments: Return Appointment in 1 week. Bathing/ Shower/ Hygiene: May shower with wound dressing protected with water repellent cover or cast protector. Edema Control - Lymphedema / Segmental Compressive Device / Other: Optional: One layer of unna paste to top of compression wrap (to act as an anchor). - and at toes Elevate, Exercise Daily and Avoid Standing for Long Periods of Time. Elevate legs to the level of the heart and pump ankles as often as possible Elevate leg(s) parallel to the floor when sitting. Laboratory ordered were: Wound culture routine - non healing wound right  ankle with redness and pain The following medication(s) was prescribed: doxycycline hyclate oral 100 mg capsule 1 1 capsule oral taken 2 times per day for 14 days starting 10/06/2021 WOUND #7: - Malleolus Wound Laterality: Right, Lateral Cleanser: Soap and Water 1 x Per Week/30 Days Discharge Instructions: Gently cleanse wound with antibacterial soap, rinse and pat dry prior to dressing wounds Topical: Mupirocin Ointment 1 x Per Week/30 Days Discharge Instructions: Apply as directed by provider. Primary Dressing: Silvercel Small 2x2 (in/in) 1 x Per Week/30 Days Discharge Instructions: Apply Silvercel Small 2x2 (in/in) as instructed Secondary Dressing: Gauze 1 x Per Week/30 Days Discharge Instructions: As directed: dry, moistened with saline or moistened with Dakins Solution Compression Wrap: Profore Lite LF 3 Multilayer Compression Bandaging System 1 x Per Week/30 Days Discharge Instructions: Apply 3 multi-layer wrap as prescribed. 1. Would recommend currently that we actually continue with the silver alginate dressing for now I think that is can to be best. 2. I am also going to recommend using some mupirocin underneath just and a thin film to help with antibiotic properties here  as far as try to get this under control. 3. I am also can recommend that we continue with a 3 layer compression wrap currently. 4. I did also send in a prescription for doxycycline for the patient I think this will be a good option she is taking this previously without any complication. We will see patient back for reevaluation in 1 week here in the clinic. If anything worsens or changes patient will contact our office for additional recommendations. Electronic Signature(s) Signed: 10/06/2021 3:05:57 PM By: Worthy Keeler PA-C Entered By: Worthy Keeler on 10/06/2021 15:05:57 Walby, [name] ([id number]) -------------------------------------------------------------------------------- SuperBill Details Patient Name: [name] Date of Service: 10/06/2021 Medical Record Number: [id number] Patient Account Number: [id number] Date of Birth/Sex: Nov 17, 1923 (87 y.o. F) Treating RN: Carlene Coria Primary Care Provider: Adrian Prows Other Clinician: Referring Provider: Adrian Prows Treating Provider/Extender: Skipper Cliche in Treatment: 20 Diagnosis Coding ICD-10 Codes Code Description [phone] Pressure ulcer of right ankle, stage 3 I89.0 Lymphedema, not elsewhere classified I87.2 Venous insufficiency (chronic) (peripheral) Z95.0 Presence of cardiac pacemaker Z79.01 Long term (current) use of anticoagulants Facility Procedures CPT4 Code: [id number] Description: (Facility Use Only) [phone] - [place] LEG Modifier: Quantity: 1 Physician Procedures CPT4 Code: [id number] Description: 09381 - WC PHYS LEVEL 4 - EST PT Modifier: Quantity: 1 CPT4 Code: Description: ICD-10 Diagnosis Description 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, stage 3 I89.0 Lymphedema, not elsewhere classified I87.2 Venous insufficiency (chronic) (peripheral) Z95.0 Presence of cardiac pacemaker Modifier: Quantity: Electronic Signature(s) Signed: 10/06/2021 3:06:57 PM By: Worthy Keeler PA-C Previous Signature: 10/06/2021 11:59:57 AM Version By: Carlene Coria RN Entered By: Worthy Keeler on 10/06/2021 15:06:57

## 2021-10-06 NOTE — Progress Notes (Addendum)
[name] ([id number]) Visit Report for 10/06/2021 Arrival Information Details Patient Name: [name], [name] Date of Service: 10/06/2021 10:15 AM Medical Record Number: [id number] Patient Account Number: [id number] Date of Birth/Sex: 1923-03-08 (87 y.o. F) Treating RN: [name] Primary Care Jillianna Stanek: Adrian Prows Other Clinician: Referring Hildegard Hlavac: Adrian Prows Treating Dayden Viverette/Extender: Skipper Cliche in Treatment: 75 Visit Information History Since Last Visit All ordered tests and consults were completed: No Patient Arrived: [name] Added or deleted any medications: No Arrival Time: 10:39 Any new allergies or adverse reactions: No Accompanied By: daughter Had a fall or experienced change in No Transfer Assistance: None activities of daily living that may affect Patient Identification Verified: Yes risk of falls: Secondary Verification Process Completed: Yes Signs or symptoms of abuse/neglect since last visito No Patient Requires Transmission-Based No Hospitalized since last visit: No Precautions: Implantable device outside of the clinic excluding No Patient Has Alerts: Yes cellular tissue based products placed in the center Patient Alerts: Patient on Blood since last visit: Thinner Has Dressing in Place as Prescribed: Yes NOT DIABETIC Has Compression in Place as Prescribed: Yes ***ELIQUIS*** Pain Present Now: Yes Electronic Signature(s) Signed: 10/06/2021 5:07:02 PM By: [name] Coria RN Entered By: [name] on 10/06/2021 10:44:40 [name] ([id number]) -------------------------------------------------------------------------------- Clinic Level of Care Assessment Details Patient Name: [name] Date of Service: 10/06/2021 10:15 AM Medical Record Number: [id number] Patient Account Number: [id number] Date of Birth/Sex: Feb 18, 1923 (87 y.o. F) Treating RN: [name] Primary Care Mystery Schrupp: Adrian Prows Other  Clinician: Referring Baylyn Sickles: Adrian Prows Treating Jatavian Calica/Extender: Skipper Cliche in Treatment: 20 Clinic Level of Care Assessment Items TOOL 1 Quantity Score []  - Use when EandM and Procedure is performed on INITIAL visit 0 ASSESSMENTS - Nursing Assessment / Reassessment []  - General Physical Exam (combine w/ comprehensive assessment (listed just below) when performed on new 0 pt. evals) []  - 0 Comprehensive Assessment (HX, ROS, Risk Assessments, Wounds Hx, etc.) ASSESSMENTS - Wound and Skin Assessment / Reassessment []  - Dermatologic / Skin Assessment (not related to wound area) 0 ASSESSMENTS - Ostomy and/or Continence Assessment and Care []  - Incontinence Assessment and Management 0 []  - 0 Ostomy Care Assessment and Management (repouching, etc.) PROCESS - Coordination of Care []  - Simple Patient / Family Education for ongoing care 0 []  - 0 Complex (extensive) Patient / Family Education for ongoing care []  - 0 Staff obtains Programmer, systems, Records, Test Results / Process Orders []  - 0 Staff telephones HHA, Nursing Homes / Clarify orders / etc []  - 0 Routine Transfer to another Facility (non-emergent condition) []  - 0 Routine Hospital Admission (non-emergent condition) []  - 0 New Admissions / Biomedical engineer / Ordering NPWT, Apligraf, etc. []  - 0 Emergency Hospital Admission (emergent condition) PROCESS - Special Needs []  - Pediatric / Minor Patient Management 0 []  - 0 Isolation Patient Management []  - 0 Hearing / Language / Visual special needs []  - 0 Assessment of Community assistance (transportation, D/C planning, etc.) []  - 0 Additional assistance / Altered mentation []  - 0 Support Surface(s) Assessment (bed, cushion, seat, etc.) INTERVENTIONS - Miscellaneous []  - External ear exam 0 []  - 0 Patient Transfer (multiple staff / Civil Service fast streamer / Similar devices) []  - 0 Simple Staple / Suture removal (25 or less) []  - 0 Complex Staple / Suture removal  (26 or more) []  - 0 Hypo/Hyperglycemic Management (do not check if billed separately) []  - 0 Ankle / Brachial Index (ABI) - do not check if billed separately Has the  patient been seen at the hospital within the last three years: Yes Total Score: 0 Level Of Care: ____ [name] ([id number]) Electronic Signature(s) Signed: 10/06/2021 5:07:02 PM By: [name] Coria RN Entered By: [name] on 10/06/2021 11:59:37 [name] ([id number]) -------------------------------------------------------------------------------- Compression Therapy Details Patient Name: [name] Date of Service: 10/06/2021 10:15 AM Medical Record Number: [id number] Patient Account Number: [id number] Date of Birth/Sex: Oct 30, 1923 (87 y.o. F) Treating RN: [name] Primary Care Darothy Courtright: Adrian Prows Other Clinician: Referring Mitsuo Budnick: Adrian Prows Treating Lary Eckardt/Extender: Skipper Cliche in Treatment: 20 Compression Therapy Performed for Wound Assessment: Wound #7 Right,Lateral Malleolus Performed By: [name] Church, RN Compression Type: Three Layer Post Procedure Diagnosis Same as Pre-procedure Electronic Signature(s) Signed: 10/06/2021 5:07:02 PM By: [name] Coria RN Entered By: [name] on 10/06/2021 11:35:04 [name] ([id number]) -------------------------------------------------------------------------------- Encounter Discharge Information Details Patient Name: [name] Date of Service: 10/06/2021 10:15 AM Medical Record Number: [id number] Patient Account Number: [id number] Date of Birth/Sex: 01/15/1923 (87 y.o. F) Treating RN: [name] Primary Care Carnell Casamento: Adrian Prows Other Clinician: Referring Rhyen Mazariego: Adrian Prows Treating Cherene Dobbins/Extender: Skipper Cliche in Treatment: 20 Encounter Discharge Information Items Discharge Condition: Stable Ambulatory Status: Walker Discharge Destination:  Home Transportation: Private Auto Accompanied By: daughter Schedule Follow-up Appointment: Yes Clinical Summary of Care: Patient Declined Electronic Signature(s) Signed: 10/06/2021 12:53:51 PM By: [name] Coria RN Entered By: [name] on 10/06/2021 12:53:51 Thoma, [name] ([id number]) -------------------------------------------------------------------------------- Lower Extremity Assessment Details Patient Name: [name] Date of Service: 10/06/2021 10:15 AM Medical Record Number: [id number] Patient Account Number: [id number] Date of Birth/Sex: 09-Jan-1923 (87 y.o. F) Treating RN: [name] Primary Care Lorely Bubb: Adrian Prows Other Clinician: Referring Judy Pollman: Adrian Prows Treating Adaira Centola/Extender: Skipper Cliche in Treatment: 20 Edema Assessment Assessed: [Left: No] [Right: No] Edema: [Left: Ye] [Right: s] Calf Left: Right: Point of Measurement: 33 cm From Medial Instep 28 cm Ankle Left: Right: Point of Measurement: 10 cm From Medial Instep 174 cm Vascular Assessment Pulses: Dorsalis Pedis Palpable: [Right:Yes] Electronic Signature(s) Signed: 10/06/2021 5:07:02 PM By: [name] Coria RN Entered By: [name] on 10/06/2021 11:10:37 Nott, [name] ([id number]) -------------------------------------------------------------------------------- Multi Wound Chart Details Patient Name: [name] Date of Service: 10/06/2021 10:15 AM Medical Record Number: [id number] Patient Account Number: [id number] Date of Birth/Sex: [birth year] (87 y.o. F) Treating RN: [name] Primary Care Saraih Lorton: Adrian Prows Other Clinician: Referring Esperanza Madrazo: Adrian Prows Treating Emari Hreha/Extender: Skipper Cliche in Treatment: 20 Vital Signs Height(in): 62 Pulse(bpm): 67 Weight(lbs): 150 Blood Pressure(mmHg): 144/81 Body Mass Index(BMI): 27 Temperature(F): 98.3 Respiratory Rate(breaths/min): 18 Photos: [N/A:N/A] Wound Location:  Right, Lateral Malleolus N/A N/A Wounding Event: Gradually Appeared N/A N/A Primary Etiology: Pressure Ulcer N/A N/A Comorbid History: Cataracts, Congestive Heart Failure, N/A N/A Hypertension, Peripheral Arterial Disease, Osteoarthritis, Neuropathy Date Acquired: 05/12/2021 N/A N/A Weeks of Treatment: 20 N/A N/A Wound Status: Open N/A N/A Measurements L x W x D (cm) 0.8x1x0.3 N/A N/A Area (cm) : 0.628 N/A N/A Volume (cm) : 0.188 N/A N/A % Reduction in Area: 44.10% N/A N/A % Reduction in Volume: 16.40% N/A N/A Classification: Category/Stage III N/A N/A Exudate Amount: Medium N/A N/A Exudate Type: Serosanguineous N/A N/A Exudate Color: red, brown N/A N/A Wound Margin: Distinct, outline attached N/A N/A Granulation Amount: Medium (34-66%) N/A N/A Granulation Quality: Red, Pink, Pale N/A N/A Necrotic Amount: Medium (34-66%) N/A N/A Exposed Structures: Fat Layer (Subcutaneous Tissue): N/A N/A Yes Fascia: No Tendon: No Muscle: No Joint: No Bone: No Epithelialization: Small (1-33%)  N/A N/A Treatment Notes Electronic Signature(s) Signed: 10/06/2021 5:07:02 PM By: [name] Coria RN Entered By: [name] on 10/06/2021 11:14:21 Fredricksen, [name] ([id number]) -------------------------------------------------------------------------------- [place] Details Patient Name: [name] Date of Service: 10/06/2021 10:15 AM Medical Record Number: [id number] Patient Account Number: [id number] Date of Birth/Sex: 1923/07/18 (87 y.o. F) Treating RN: [name] Primary Care Martavius Lusty: Adrian Prows Other Clinician: Referring Aleera Gilcrease: Adrian Prows Treating Synia Douglass/Extender: Skipper Cliche in Treatment: 20 Active Inactive Electronic Signature(s) Signed: 10/06/2021 5:07:02 PM By: [name] Coria RN Entered By: [name] on 10/06/2021 11:13:34 Musso, [name]  ([id number]) -------------------------------------------------------------------------------- Pain Assessment Details Patient Name: [name] Date of Service: 10/06/2021 10:15 AM Medical Record Number: [id number] Patient Account Number: [id number] Date of Birth/Sex: 1923-08-01 (87 y.o. F) Treating RN: [name] Primary Care Chelsea Pedretti: Adrian Prows Other Clinician: Referring Shalie Schremp: Adrian Prows Treating Saiya Crist/Extender: Skipper Cliche in Treatment: 20 Active Problems Location of Pain Severity and Description of Pain Patient Has Paino No Site Locations Pain Management and Medication Current Pain Management: Electronic Signature(s) Signed: 10/06/2021 5:07:02 PM By: [name] Coria RN Entered By: [name] on 10/06/2021 10:45:30 Harbach, [name] ([id number]) -------------------------------------------------------------------------------- Patient/Caregiver Education Details Patient Name: [name] Date of Service: 10/06/2021 10:15 AM Medical Record Number: [id number] Patient Account Number: [id number] Date of Birth/Gender: 10-15-1923 (87 y.o. F) Treating RN: [name] Primary Care Physician: Adrian Prows Other Clinician: Referring Physician: Adrian Prows Treating Physician/Extender: Skipper Cliche in Treatment: 20 Education Assessment Education Provided To: Patient Education Topics Provided Wound/Skin Impairment: Methods: Explain/Verbal Responses: State content correctly Electronic Signature(s) Signed: 10/06/2021 5:07:02 PM By: [name] Coria RN Entered By: [name] on 10/06/2021 12:53:01 Balbach, [name] ([id number]) -------------------------------------------------------------------------------- Wound Assessment Details Patient Name: [name] Date of Service: 10/06/2021 10:15 AM Medical Record Number: [id number] Patient Account Number: [id number] Date of Birth/Sex: 09-03-1923 (87 y.o. F) Treating RN: [name] Primary Care Jacie Tristan: Adrian Prows Other Clinician: Referring Glorene Leitzke: Adrian Prows Treating Amahia Madonia/Extender: Skipper Cliche in Treatment: 20 Wound Status Wound Number: 7 Primary Pressure Ulcer Etiology: Wound Location: Right, Lateral Malleolus Wound Open Wounding Event: Gradually Appeared Status: Date Acquired: 05/12/2021 Comorbid Cataracts, Congestive Heart Failure, Hypertension, Weeks Of Treatment: 20 History: Peripheral Arterial Disease, Osteoarthritis, Neuropathy Clustered Wound: No Photos Wound Measurements Length: (cm) 0.8 Width: (cm) 1 Depth: (cm) 0.3 Area: (cm) 0.628 Volume: (cm) 0.188 % Reduction in Area: 44.1% % Reduction in Volume: 16.4% Epithelialization: Small (1-33%) Tunneling: No Undermining: No Wound Description Classification: Category/Stage III Wound Margin: Distinct, outline attached Exudate Amount: Medium Exudate Type: Serosanguineous Exudate Color: red, brown Foul Odor After Cleansing: No Slough/Fibrino Yes Wound Bed Granulation Amount: Medium (34-66%) Exposed Structure Granulation Quality: Red, Pink, Pale Fascia Exposed: No Necrotic Amount: Medium (34-66%) Fat Layer (Subcutaneous Tissue) Exposed: Yes Tendon Exposed: No Muscle Exposed: No Joint Exposed: No Bone Exposed: No Treatment Notes Wound #7 (Malleolus) Wound Laterality: Right, Lateral Cleanser Soap and Water Discharge Instruction: Gently cleanse wound with antibacterial soap, rinse and pat dry prior to dressing wounds Peri-Wound Care Lehan, [name] ([id number]) Topical Mupirocin Ointment Discharge Instruction: Apply as directed by Daelyn Pettaway. Primary Dressing Silvercel Small 2x2 (in/in) Discharge Instruction: Apply Silvercel Small 2x2 (in/in) as instructed Secondary Dressing Gauze Discharge Instruction: As directed: dry, moistened with saline or moistened with Dakins Solution Secured With Compression Wrap Profore Lite LF 3 Multilayer Compression  Bandaging System Discharge Instruction: Apply 3 multi-layer wrap as prescribed. Compression Stockings Add-Ons Electronic Signature(s) Signed: 10/06/2021 5:07:02 PM By: [name] Coria RN Entered By:  [name] on 10/06/2021 11:00:23 Fleet, [name] ([id number]) -------------------------------------------------------------------------------- Vitals Details Patient Name: [name], [name] Date of Service: 10/06/2021 10:15 AM Medical Record Number: [id number] Patient Account Number: [id number] Date of Birth/Sex: [birth year] (87 y.o. F) Treating RN: [name] Primary Care Dezirea Mccollister: Adrian Prows Other Clinician: Referring Tenoch Mcclure: Adrian Prows Treating Stephannie Broner/Extender: Skipper Cliche in Treatment: 20 Vital Signs Time Taken: 10:44 Temperature (F): 98.3 Height (in): 62 Pulse (bpm): 71 Weight (lbs): 150 Respiratory Rate (breaths/min): 18 Body Mass Index (BMI): 27.4 Blood Pressure (mmHg): 144/81 Reference Range: 80 - 120 mg / dl Electronic Signature(s) Signed: 10/06/2021 5:07:02 PM By: [name] Coria RN Entered By: [name] on 10/06/2021 10:45:10

## 2021-10-09 LAB — AEROBIC CULTURE W GRAM STAIN (SUPERFICIAL SPECIMEN)

## 2021-10-13 ENCOUNTER — Encounter: Payer: Medicare Other | Admitting: Physician Assistant

## 2021-10-13 ENCOUNTER — Other Ambulatory Visit: Payer: Self-pay

## 2021-10-13 DIAGNOSIS — I872 Venous insufficiency (chronic) (peripheral): Secondary | ICD-10-CM | POA: Insufficient documentation

## 2021-10-13 DIAGNOSIS — I890 Lymphedema, not elsewhere classified: Secondary | ICD-10-CM | POA: Diagnosis not present

## 2021-10-13 DIAGNOSIS — L89513 Pressure ulcer of right ankle, stage 3: Secondary | ICD-10-CM | POA: Insufficient documentation

## 2021-10-13 DIAGNOSIS — Z950 Presence of cardiac pacemaker: Secondary | ICD-10-CM | POA: Diagnosis not present

## 2021-10-13 DIAGNOSIS — G629 Polyneuropathy, unspecified: Secondary | ICD-10-CM | POA: Insufficient documentation

## 2021-10-13 DIAGNOSIS — M1990 Unspecified osteoarthritis, unspecified site: Secondary | ICD-10-CM | POA: Insufficient documentation

## 2021-10-13 DIAGNOSIS — Z881 Allergy status to other antibiotic agents status: Secondary | ICD-10-CM | POA: Diagnosis not present

## 2021-10-13 DIAGNOSIS — Z7901 Long term (current) use of anticoagulants: Secondary | ICD-10-CM | POA: Insufficient documentation

## 2021-10-13 NOTE — Progress Notes (Addendum)
KAMAIYA, ANTILLA ([id number]) Visit Report for 10/13/2021 Chief Complaint Document Details Patient Name: [name], [name]. Date of Service: 10/13/2021 10:15 AM Medical Record Number: [id number] Patient Account Number: [id number] Date of Birth/Sex: [birth year] (87 y.o. F) Treating RN: Carlene Coria Primary Care Provider: Adrian Prows Other Clinician: Referring Provider: Adrian Prows Treating Provider/Extender: Skipper Cliche in Treatment: 21 Information Obtained from: Patient Chief Complaint Right foot ulcer Electronic Signature(s) Signed: 10/13/2021 10:20:26 AM By: Worthy Keeler PA-C Entered By: Worthy Keeler on 10/13/2021 10:20:26 Joni Fears [name] ([id number]) -------------------------------------------------------------------------------- Cellular or Tissue Based Product Details Patient Name: [name], [name] Date of Service: 10/13/2021 10:15 AM Medical Record Number: [id number] Patient Account Number: [id number] Date of Birth/Sex: February 17, 1923 (87 y.o. F) Treating RN: Carlene Coria Primary Care Provider: Adrian Prows Other Clinician: Referring Provider: Adrian Prows Treating Provider/Extender: Skipper Cliche in Treatment: 21 Cellular or Tissue Based Product Type Wound #7 Right,Lateral Malleolus Applied to: Performed By: Physician Tommie Sams., PA-C Cellular or Tissue Based Product [place] Type: Level of Consciousness (Pre- Awake and Alert procedure): Pre-procedure Verification/Time Out Yes - 10:55 Taken: Location: trunk / arms / legs Wound Size (sq cm): 1 Product Size (sq cm): 1.6 Waste Size (sq cm): 0 Amount of Product Applied (sq cm): 1.6 Instrument Used: Forceps, Scissors Lot #: [id number] Order #: 1 Expiration Date: 05/03/2026 Fenestrated: No Reconstituted: Yes Solution Type: normal saline Solution Amount: 10 ml Lot #: [id number] Solution Expiration Date: 05/05/2023 Secured: Yes Secured With: Steri-Strips Dressing Applied:  Yes Primary Dressing: adaptic Procedural Pain: 0 Post Procedural Pain: 0 Response to Treatment: Procedure was tolerated well Level of Consciousness (Post- Awake and Alert procedure): Post Procedure Diagnosis Same as Pre-procedure Electronic Signature(s) Signed: 10/14/2021 3:56:47 PM By: Carlene Coria RN Entered By: Carlene Coria on 10/13/2021 10:59:52 [name], [name] ([id number]) -------------------------------------------------------------------------------- Debridement Details Patient Name: [name] Date of Service: 10/13/2021 10:15 AM Medical Record Number: [id number] Patient Account Number: [id number] Date of Birth/Sex: 1923-03-30 (87 y.o. F) Treating RN: Carlene Coria Primary Care Provider: Adrian Prows Other Clinician: Referring Provider: Adrian Prows Treating Provider/Extender: Skipper Cliche in Treatment: 21 Debridement Performed for Wound #7 Right,Lateral Malleolus Assessment: Performed By: Physician Tommie Sams., PA-C Debridement Type: Debridement Level of Consciousness (Pre- Awake and Alert procedure): Pre-procedure Verification/Time Out Yes - 10:45 Taken: Start Time: 10:45 Total Area Debrided (L x W): 1 (cm) x 1 (cm) = 1 (cm) Tissue and other material Viable, Non-Viable, Slough, Subcutaneous, Skin: Dermis , Skin: Epidermis, Slough debrided: Level: Skin/Subcutaneous Tissue Debridement Description: Excisional Instrument: Curette Bleeding: Minimum Hemostasis Achieved: Pressure End Time: 10:48 Procedural Pain: 0 Post Procedural Pain: 0 Response to Treatment: Procedure was tolerated well Level of Consciousness (Post- Awake and Alert procedure): Post Debridement Measurements of Total Wound Length: (cm) 1 Stage: Category/Stage III Width: (cm) 1 Depth: (cm) 0.3 Volume: (cm) 0.236 Character of Wound/Ulcer Post Debridement: Improved Post Procedure Diagnosis Same as Pre-procedure Electronic Signature(s) Signed: 10/13/2021  5:14:46 PM By: Worthy Keeler PA-C Signed: 10/14/2021 3:56:47 PM By: Carlene Coria RN Entered By: Carlene Coria on 10/13/2021 10:47:42 [name], [name] ([id number]) -------------------------------------------------------------------------------- HPI Details Patient Name: [name] Date of Service: 10/13/2021 10:15 AM Medical Record Number: [id number] Patient Account Number: [id number] Date of Birth/Sex: 1923-07-26 (87 y.o. 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 Dr. Amalia Hailey the podiatrist saw her on 07/20/2017 and sent her to the wound center for possible hyperbaric oxygen therapy. past medical history of peripheral vascular disease, varicose veins, status post appendectomy, basal cell carcinoma excision from the left leg, cholecystectomy, pacemaker placement, right lower extremity angiography done by Dr. dew in March 2017 with placement of a stent. there is also note of a successful ablation of the right small saphenous vein done which was reviewed by ultrasound on 10/24/2016. the patient had a right small saphenous vein ablation done on 10/20/2016. The patient has never been a smoker. She has been seen by Dr. Corene Cornea dew the vascular surgeon who most recently saw her on 06/15/2017 for evaluation of ongoing problems with right leg swelling. She had a lower extremity arterial duplex examination done(02/13/17) which showed patent distal right superficial femoral artery stent and above-the-knee popliteal stent without evidence of restenosis. The ABI was more than 1.3 on the right and more than 1.3 on  the left. This was consistent with noncompressible arteries due to medial calcification. The right great toe pressure and PPG waveforms are within normal limits and the left great toe pressure and PPG waveforms are decreased. he recommended she continue to wear her compression stockings and continue with elevation. She is scheduled to have a noninvasive arterial study in the near future 08/16/2017 -- had a lower extremity arterial duplex examination done which showed patent distal right superficial femoral artery stent and above-the-knee popliteal stent without evidence of restenosis. The ABI was more than 1.3 on the right and more than 1.3 on the left. This was consistent with noncompressible arteries due to medial calcification. The right great toe pressure and PPG waveforms are within normal limits and the left great toe pressure and PPG waveforms are decreased. the x-ray of the right ankle has not yet been done 08/24/2017 -- had a right ankle x-ray -- IMPRESSION:1. No fracture, bone lesion or evidence of osteomyelitis. 2. Lateral soft tissue swelling with a soft tissue ulcer. she has not yet seen the vascular surgeon for review 08/31/17 on evaluation today patient's wound appears to be showing signs of improvement. She still with her appointment with vascular in order to review her results of her vascular study and then determine if any intervention would be recommended at that time. No fevers, chills, nausea, or vomiting noted at this time. She has been tolerating the dressing changes without complication. 09/28/17 on evaluation today patient's wound appears to show signs of good improvement in regard to the granulation tissue which is surfacing. There is still a layer of slough covering the wound and the posterior portion is still significantly deeper than the anterior nonetheless there has been some good sign of things moving towards the better. She is going to go back to Dr. dew for  reevaluation to ensure her blood flow is still appropriate. That will be before her next evaluation with [place] next week. No fevers, chills, nausea, or vomiting noted at this time. Patient does have some discomfort rated to be a 3-4/10 depending on activity specifically cleansing the wound makes it worse. 10/05/2017 -- the patient was seen by Dr. Lucky Cowboy last week and noninvasive studies showed a normal right ABI with brisk triphasic waveforms consistent with no arterial insufficiency including normal digital pressures. The duplex showed a patent distal right SFA stent and the proximal SFA was also normal. He was pleased with  her test and thought she should have enough of perfusion for normal wound healing. He would see her back in 6 months time. 12/21/17 on evaluation today patient appears to be doing fairly well in regard to her right lateral ankle wound. Unfortunately the main issue that she is expansion at this point is that she is having some issues with what appears to be some cellulitis in the right anterior shin. She has also been noting a little bit of uncomfortable feeling especially last night and her ankle area. I'm afraid that she made the developing a little bit of an infection. With that being said I think it is in the early stages. 12/28/17 on evaluation today patient's ankle appears to be doing excellent. She's making good progress at this point the cellulitis seems to have improved after last week's evaluation. Overall she is having no significant discomfort which is excellent news. She does have an appointment with Dr. dew on March 29, 2018 for reevaluation in regard to the stent he placed. She seems to have excellent blood flow in the right lower extremity. 01/19/12 on evaluation today patient's wound appears to be doing very well. In fact she does not appear to require debridement at this point, there's no evidence of infection, and overall from the standpoint of the wound she seems to be  doing very well. With that being said I believe that it may be time to switch to different dressing away from the [facility] Dressing she tells me she does have a lot going on her friend actually passed away yesterday and she's also having a lot of issues with her husband this obviously is weighing heavy on her as far as your thoughts and concerns today. 01/25/18 on evaluation today patient appears to be doing fairly well in regard to her right lateral malleolus. She has been tolerating the dressing changes without complication. Overall I feel like this is definitely showing signs of improvement as far as how the overall appearance of the wound is there's also evidence of epithelium start to migrate over the granulation tissue. In general I think that she is progressing nicely as far as the wound is concerned. The only concern she really has is whether or not we can switch to every other week visits in order to avoid having as many appointments as her daughters have a difficult time getting her to her appointments as well as the patient's husband to his he is not doing very well at this point. 02/22/18 on evaluation today patient's right lateral malleolus ulcer appears to be doing great. She has been tolerating the dressing changes without complication. Overall you making excellent progress at this time. Patient is having no significant discomfort. [name], [name] ([id number]) 03/15/18 on evaluation today patient appears to be doing much more poorly in regard to her right lateral ankle ulcer at this point. Unfortunately since have last seen her her husband has passed just a few days ago is obviously weighed heavily on her her daughter also had surgery well she is with her today as usual. There does not appear to be any evidence of infection she does seem to have significant contusion/deep tissue injury to the right lateral malleolus which was not noted previous when I saw her last. It's hard to  tell of exactly when this injury occurred although during the time she was spending the night in the hospital this may have been most likely. 03/22/18 on evaluation today patient appears to actually be doing very  well in regard to her ulcer. She did unfortunately have a setback which was noted last week however the good news is we seem to be getting back on track and in fact the wound in the core did still have some necrotic tissue which will be addressed at this point today but in general I'm seeing signs that things are on the up and up. She is glad to hear this obviously she's been somewhat concerned that due to the how her wound digressed more recently. 03/29/18 on evaluation today patient appears to be doing fairly well in regard to her right lower extremity lateral malleolus ulcer. She unfortunately does have a new area of pressure injury over the inferior portion where the wound has opened up a little bit larger secondary to the pressure she seems to be getting. She does tell me sometimes when she sleeps at night that it actually hurts and does seem to be pushing on the area little bit more unfortunately. There does not appear to be any evidence of infection which is good news. She has been tolerating the dressing changes without complication. She also did have some bruising in the left second and third toes due to the fact that she may have bump this or injured it although she has neuropathy so she does not feel she did move recently that may have been where this came from. Nonetheless there does not appear to be any evidence of infection at this time. 04/12/18 on evaluation today patient's wound on the right lateral ankle actually appears to be doing a little bit better with a lot of necrotic docking tissue centrally loosening up in clearing away. However she does have the beginnings of a deep tissue injury on the left lateral malleolus likely due to the fact we've been trying offload the right  as much as we have. I think she may benefit from an assistive soft device to help with offloading and it looks like they're looking at one of the doughnut conditions that wraps around the lower leg to offload which I think will definitely do a good job. With that being said I think we definitely need to address this issue on the left before it becomes a wound. Patient is not having significant pain. 04/19/18 on evaluation today patient appears to be doing excellent in regard to the progress she's made with her right lateral ankle ulcer. The left ankle region which did show evidence of a deep tissue injury seems to be resolving there's little fluid noted underneath and a blister there's nothing open at this point in time overall I feel like this is progressing nicely which is good news. She does not seem to be having significant discomfort at this point which is also good news. 04/25/18-She is here in follow up evaluation for bilateral lateral malleolar ulcers. The right lateral malleolus ulcer with pale subcutaneous tissue exposure, central area of ulcer with tendon/periosteum exposed. The left lateral malleolus ulcer now with central area of nonviable tissue, otherwise deep tissue injury. She is wearing compression wraps to the left lower extremity, she will place the right lower extremity compression wraps on when she gets home. She will be out of town over the weekend and return next week and follow-up appointment. She completed her doxycycline this morning 05/03/18 on evaluation today patient appears to be doing very well in regard to her right lateral ankle ulcer in general. At least she's showing some signs of improvement in this regard. Unfortunately she has some  additional injury to the left lateral malleolus region which appears to be new likely even over the past several days. Again this determination is based on the overall appearance. With that being said the patient is obviously frustrated  about this currently. 05/10/18-She is here in follow-up evaluation for bilateral lateral malleolar ulcers. She states she has purchased offloading shoes/boots and they will arrive tomorrow. She was asked to bring them in the office at next week's appointment so her provider is aware of product being utilized. She continues to sleep on right or left side, she has been encouraged to sleep on her back. The right lateral malleolus ulcer is precariously close to peri-osteum; will order xray. The left lateral malleolus ulcer is improved. Will switch back to santyl; she will follow up next week. 05/17/18 on evaluation today patient actually appears to be doing very well in regard to her malleolus her ulcers compared to last time I saw them. She does not seem to have as much in the way of contusion at this point which is great news. With that being said she does continue to have discomfort and I do believe that she is still continuing to benefit from the offloading/pressure reducing boots that were recommended. I think this is the key to trying to get this to heal up completely. 05/24/18 on evaluation today patient actually appears to be doing worse at this point in time unfortunately compared to her last week's evaluation. She is having really no increased pain which is good news unfortunately she does have more maceration in your theme and noted surrounding the right lateral ankle the left lateral ankle is not really is erythematous I do not see signs of the overt cellulitis on that side. Unfortunately the wounds do not seem to have shown any signs of improvement since the last evaluation. She also has significant swelling especially on the right compared to previous some of this may be due to infection however also think that she may be served better while she has these wounds by compression wrapping versus continuing to use the Juxta-Lite for the time being. Especially with the amount of drainage that she is  experiencing at this point. No fevers, chills, nausea, or vomiting noted at this time. 05/31/18 on evaluation today patient appears to actually be doing better in regard to her right lateral lower extremity ulcer specifically on the malleolus region. She has been tolerating the antibiotic without complication. With that being said she still continues to have issues but a little bit of redness although nothing like she what she was experiencing previous. She still continues to pressure to her ankle area she did get the problem on offloading boots unfortunately she will not wear them she states there too uncomfortable and she can't get in and out of the bed. Nonetheless at this point her wounds seem to be continually getting worse which is not what we want I'm getting somewhat concerned about her progress and how things are going to proceed if we do not intervene in some way shape or form. I therefore had a very lengthy conversation today about offloading yet again and even made a specific suggestion for switching her to a memory foam mattress and even gave the information for a specific one that they could look at getting if it was something that they were interested in considering. She does not want to be considered for a hospital bed air mattress although honestly insurance would not cover it that she does not have  any wounds on her trunk. 06/14/18 on evaluation today both wounds over the bilateral lateral malleolus her ulcers appear to be doing better there's no evidence of pressure injury at this point. She did get the foam mattress for her bed and this does seem to have been extremely beneficial for her in my pinion. Her daughter states that she is having difficulty getting out of bed because of how soft it is. The patient also relates this to be. Nonetheless I do feel like she's actually doing better. Unfortunately right after and around the time she was getting the mattress she also sustained a  fall when she got up to go pick up the phone and ended up injuring her right elbow she has 18 sutures in place. We are not caring for this currently although home health is going to be taking the sutures out shortly. Nonetheless this may be something that we need to evaluate going forward. It depends on how well it has or has not healed in the end. She also recently saw an orthopedic specialist for an injection in the right shoulder just before her fall unfortunately the fall seems to have worsened her pain. 06/21/18 on evaluation today patient appears to be doing about the same in regard to her lateral malleolus ulcers. Both appear to be just a little bit deeper but again we are clinging away the necrotic and dead tissue which I think is why this is progressing towards a deeper realm as opposed [name], [name]. ([id number]) to improving from my measurement standpoint in that regard. Nonetheless she has been tolerating the dressing changes she absolutely hates the memory foam mattress topper that was obtained for her nonetheless I do believe this is still doing excellent as far as taking care of excess pressure in regard to the lateral malleolus regions. She in fact has no pressure injury that I see whereas in weeks past it was week by week I was constantly seeing new pressure injuries. Overall I think it has been very beneficial for her. 07/03/18; patient arrives in my clinic today. She has deep punched out areas over her bilateral lateral malleoli. The area on the right has some more depth. We spent a lot of time today talking about pressure relief for these areas. This started when her daughter asked for a prescription for a memory foam mattress. I have never written a prescription for a mattress and I don't think insurances would pay for that on an ordinary bed. In any case he came up that she has foam boots that she refuses to wear. I would suggest going to these before any other offloading  issues when she is in bed. They say she is meticulous about offloading this the rest of the day 07/10/18- She is seen in follow-up evaluation for bilateral, lateral malleolus ulcers. There is no improvement in the ulcers. She has purchased and is sleeping on a memory foam mattress/overlay, she has been using the offloading boots nightly over the past week. She has a follow up appointment with vascular medicine at the end of October, in my opinion this follow up should be expedited given her deterioration and suboptimal TBI results. We will order plain film xray of the left ankle as deeper structures are palpable; would consider having MRI, regardless of xray report(s). The ulcers will be treated with iodoflex/iodosorb, she is unable to safely change the dressings daily with santyl. 07/19/18 on evaluation today patient appears to be doing in general visually well in regard  to her bilateral lateral malleolus ulcers. She has been tolerating the dressing changes without complication which is good news. With that being said we did have an x-ray performed on 07/12/18 which revealed a slight loosen see in the lateral portion of the distal left fibula which may represent artifact but underline lytic destruction or osteomyelitis could not be excluded. MRI was recommended. With that being said we can see about getting the patient scheduled for an MRI to further evaluate this area. In fact we have that scheduled currently for August 20 19,019. 07/26/18 on evaluation today patient's wound on the right lateral ankle actually appears to be doing fairly well at this point in my pinion. She has made some good progress currently. With that being said unfortunately in regard to the left lateral ankle ulcer this seems to be a little bit more problematic at this time. In fact as I further evaluated the situation she actually had bone exposed which is the first time that's been the case in the bone appear to be  necrotic. Currently I did review patient's note from Dr. Bunnie Domino office with [facility]. He stated that ABI was 1.26 on the right and 0.95 on the left with good waveforms. Her perfusion is stable not reduced from previous studies and her digital waveforms were pretty good particularly on the right. His conclusion upon review of the note was that there was not much she could do to improve her perfusion and he felt she was adequate for wound healing. His suggestion was that she continued to see [place] and consider a synthetic skin graft if there was no underlying infection. He plans to see her back in six months or as needed. 08/01/18 on evaluation today patient appears to be doing better in regard to her right lateral ankle ulcer. Her left lateral ankle ulcer is about the same she still has bone involvement in evidence of necrosis. There does not appear to be evidence of infection at this time On the right lateral lower extremity. I have started her on the Augmentin she picked this up and started this yesterday. This is to get her through until she sees infectious disease which is scheduled for 08/12/18. 08/06/18 on evaluation today patient appears to be doing rather well considering my discussion with patient's daughter at the end of last week. The area which was marked where she had erythema seems to be improved and this is good news. With that being said overall the patient seems to be making good improvement when it comes to the overall appearance of the right lateral ankle ulcer although this has been slow she at least is coming around in this regard. Unfortunately in regard to the left lateral ankle ulcer this is osteomyelitis based on the pathology report as well is bone culture. Nonetheless we are still waiting CT scan. Unfortunately the MRI we originally ordered cannot be performed as the patient is a pacemaker which I had overlooked. Nonetheless we are working on the CT scan  approval and scheduling as of now. She did go to the hospital over the weekend and was placed on IV Cefzo for a couple of days. Fortunately this seems to have improved the erythema quite significantly which is good news. There does not appear to be any evidence of worsening infection at this time. She did have some bleeding after the last debridement therefore I did not perform any sharp debridement in regard to left lateral ankle at this point. Patient has  been approved for a snap vac for the right lateral ankle. 08/14/18; the patient with wounds over her bilateral lateral malleoli. The area on the right actually looks quite good. Been using a snap back on this area. Healthy granulation and appears to be filling in. Unfortunately the area on the left is really problematic. 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 Also noted to have cellulitis. She saw Dr. Novella Olive of infectious disease today and was put on linezolid. We are able to verify this with her pharmacy. She is completed the Augmentin that she was already on. We've been using Iodoflex to this area 08/23/18 on evaluation today patient's wounds both actually appear to be doing better compared to my prior evaluations. Fortunately she showing signs of good improvement in regard to the overall wound status especially where were using the snap vac on the right. In regard to left lateral malleolus the wound bed actually appears to be much cleaner than previously noted. I do not feel any phone directly probed during evaluation today and though there is tendon noted this does not appear to be necrotic it's actually fairly good as far as the overall appearance of the tendon is concerned. In general the wound bed actually appears to be doing significantly better than it was previous. Patient is currently in the care of [name] and I did review that note today. He actually  has her on two weeks of linezolid and then following the patient will be on 1-2 months of Keflex. That is the plan currently. She has been on antibiotics therapy as prescribed by myself initially starting on July 30, 2018 and has been on that continuously up to this point. 08/30/18 on evaluation today patient actually appears to be doing much better in regard to her right lateral malleolus ulcer. She has been tolerating the dressing changes specifically the snap vac without complication although she did have some issues with the seal currently. Apparently there was some trouble with getting it to maintain over the past week past Sunday. Nonetheless overall the wound appears better in regard to the right lateral malleolus region. In regard to left lateral malleolus this actually show some signs of additional granulation although there still tendon noted in the base of the wound this appears to be healthy not necrotic in any way whatsoever. We are considering potentially using a snap vac for the left lateral malleolus as well the product wrap from [employer], [employer], was present in the clinic today we're going to see this patient I did have her come in with me after obtaining consent from the patient and her daughter in order to look at the wound and see if there's any recommendation one way or another as to whether or not they felt the snapback could be beneficial for the left lateral malleolus region. But the conclusion was that it might be but that this is definitely a little bit deeper wound than what traditionally would be utilized for a snap vac. 09/06/18 on evaluation today patient actually appears to be doing excellent in my pinion in regard to both ankle ulcers. She has been tolerating the dressing changes without complication which is great news. Specifically we have been using the snap vac. In regard to the right ankle I'm not even sure that this is going to be necessary for today and following as  the wound has filled in quite nicely. In regard to  the left ankle I do believe [name], [name] ([id number]) that we're seeing excellent epithelialization from the edge as well as granulation in the central portion the tendon is still exposed but there's no evidence of necrotic bone and in general I feel like the patient has made excellent progress even compared to last week with just one week of the snap vac. 09/11/18; this is a patient who has wounds on her bilateral lateral malleoli. Initially both of these were deep stage IV wounds in the setting of chronic arterial insufficiency. She has been revascularized. As I understand think she been using snap vacs to both of these wounds however the area on the right became more superficial and currently she is only using it on the left. Using silver collagen on the right and silver collagen under the back on the left I believe 09/19/18 on evaluation today patient actually appears to be doing very well in regard to her lateral malleolus or ulcers bilaterally. She has been tolerating the dressing changes without complication. Fortunately there does not appear to be any evidence of infection at this time. Overall I feel like she is improving in an excellent manner and I'm very pleased with the fact that everything seems to be turning towards the better for her. This has obviously been a long road. 09/27/18 on evaluation today patient actually appears to be doing very well in regard to her bilateral lateral malleolus ulcers. She has been tolerating the dressing changes without complication. Fortunately there does not appear to be any evidence of infection at this time which is also great news. No fevers, chills, nausea, or vomiting noted at this time. Overall I feel like she is doing excellent with the snap vac on the left malleolus. She had 40 mL of fluid collection over the past week. 10/04/18 on evaluation today patient actually appears to be doing well  in regard to her bilateral lateral malleolus ulcers. She continues to tolerate the dressing changes without complication. One issue that I see is the snap vac on the left lateral malleolus which appears to have sealed off some fluid underlying this area and has not really allowed it to heal to the degree that I would like to see. For that reason I did suggest at this point we may want to pack a small piece of packing strip into this region to allow it to more effectively wick out fluid. 10/11/18 in general the patient today does not feel that she has been doing very well. She's been a little bit lethargic and subsequently is having bodyaches as well according to what she tells me today. With that being said overall she has been concerned with the fact that something may be worsening although to be honest her wounds really have not been appearing poorly. She does have a new ulcer on her left heel unfortunately. This may be pressure related. Nonetheless it seems to me to have potentially started at least as a blister I do not see any evidence of deep tissue injury. In regard to the left ankle the snap vac still seems to be causing the ceiling off of the deeper part of the wound which is in turn trapping fluid. I'm not extremely pleased with the overall appearance as far as progress from last week to this week therefore I'm gonna discontinue the snap vac at this point. 10/18/18 patient unfortunately this point has not been feeling well for the past several days. She was seen by Grayland Ormond her primary  care provider who is a Librarian, academic at [facility]. Subsequently she states that she's been very weak and generally feeling malaise. No fevers, chills, nausea, or vomiting noted at this time. With that being said bloodwork was performed at the PCP office on the 11th of this month which showed a white blood cell count of 10.7. This was repeated today and shows a white blood cell count of 12.4.  This does show signs of worsening. Coupled with the fact that she is feeling worse and that her left ankle wound is not really showing signs of improvement I feel like this is an indication that the osteomyelitis is likely exacerbating not improving. Overall I think we may also want to check her C-reactive protein and sedimentation rate. Actually did call [facility] office this afternoon while the patient was in the office here with me. Subsequently based on the findings we discussed treatment possibilities and I think that it is appropriate for [place] to go ahead and initiate treatment with doxycycline which I'm going to do. Subsequently he did agree to see about adding a CRP and sedimentation rate to her orders. If that has not already been drawn to where they can run it they will contact the patient she can come back to have that check. They are in agreement with plan as far as the patient and her daughter are concerned. Nonetheless also think we need to get in touch with Dr. Henreitta Leber office to see about getting the patient scheduled with him as soon as possible. 11/08/18 on evaluation today patient presents for follow-up concerning her bilateral foot and ankle ulcers. I did do an extensive review of her chart in epic today. Subsequently she was seen by [name] he did initiate Cefepime IV antibiotic therapy. Subsequently she had some issues with her PICC line this had to be removed because it was coiled and then replaced. Fortunately that was now settled. Unfortunately she has continued have issues with her left heel as well as the issues that she is experiencing with her bilateral lateral malleolus regions. I do believe however both areas seem to be doing a little bit better on evaluation today which is good news. No fevers, chills, nausea, or vomiting noted at this time. She actually has an angiogram schedule with Dr. dew on this coming Monday, November 11, 2018. Subsequently the patient states  that she is feeling much better especially than what she was roughly 2 weeks ago. She actually had to cancel an appointment because she was feeling so poorly. No fevers, chills, nausea, or vomiting noted at this time. 11/15/18 on evaluation today patient actually is status post having had her angiogram with Dr. dew Monday, four days ago. 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 He had to go and work on several areas of the vasculature fortunately he was able to obtain no more than a 30% residual stenosis throughout post procedure. I reviewed this note today. I think this will definitely help with healing at this time. Fortunately there does not appear to be any signs of infection and I do feel like ratio already has a better appearance to it. 11/22/18 upon evaluation today patient actually appears to be doing very well in regard to her wounds in general. The right lateral malleolus looks excellent the heel looks better in the left lateral malleolus also appears to be doing a little better. With that being said the right second toe actually appears to  be open and training we been watching this is been dry and stable but now is open. 12/03/2018 Seen today for follow-up and management of multiple bilateral lower extremity wounds. New pressure injury of the great toe which is closed at this time. Wound of the right distal second toe appears larger today with deep undermining and a pocket of fluid present within the undermining region. Left and right malleolus is wounds are stable today with no signs and symptoms of infection.Denies any needs or concerns during exam today. 12/13/18 on evaluation today patient appears to be doing somewhat better in regard to her left heel ulcer. She also seems to be completely healed in regard to the right lateral malleolus ulcer. The left malleolus ulcer is smaller what unfortunately the wounds which are new over the first and second toes of the  right foot are what are most concerning at this point especially the second. Both areas did require sharp debridement today. 12/20/18 on evaluation today patient's wound actually appears to be doing better in regard to left lateral ankle and her right lateral ankle continues to remain healed. The hill ulcer on the left is improved. She does have improvement noted as well in regard to both toe ulcers. Overall I'm very pleased in this regard. No fevers, chills, nausea, or vomiting noted at this time. 12/23/18 on evaluation today patient is seen after she had her toenails trimmed at the podiatrist office due to issues with her right great toe. There was what appeared to be dark eschar on the surface of the wound which had her in the podiatrist concerned. Nonetheless as I remember that during the last office visit I had utilize silver nitrate of this area I was much less concerned about the situation. Subsequently I was able to clean off much of this tissue without any complication today. This does not appear to show any signs of infection and actually look somewhat better [name], [name] ([id number]) compared to last time post debridement. Her second toe on the right foot actually had callous over and there did appear still be some fluid underneath this that would require debridement today. 12/27/18 on evaluation today patient actually appears to be showing signs of improvement at all locations. Even the left lateral ankle although this is not quite as great as the other sites. Fortunately there does not appear to be any signs of infection at this time and both of her toes on the right foot seem to be showing signs of improvement which is good news and very pleased in this regard. 01/03/19 on evaluation today patient appears to be doing better for the most part in regard to her wounds in particular. There does not appear to be any evidence of infection at this time which is good news. Fortunately there is  no sign of really worsening anywhere except for the right great toe which she does have what appears to be a bruise/deep tissue injury which is very superficial and already resolving. I'm not sure where this came from I questioned her extensively and she does not recall what may have happened with this. Other than that the patient seems to be doing well even the left lateral ankle ulcer looks good and is getting smaller. 01/10/19 on evaluation today patient appears to be doing well in regard to her left heel wound and both of her toe wounds. Overall I feel like there is definitely improvement here and I'm happy in that regard. With that being said unfortunately she  is having issues with the left lateral malleolus ulcer which unfortunately still has a lot of depth to it. This is gonna be a very difficult wound for [place] to be able to truly get to heal. I may want to consider some type of skin substitute to see if this would be of benefit for her. I'll discuss this with her more the next visit most likely. This was something I thought about more at the end of the visit when I was Artie out of the room and the patient had been discharged. 01/17/19 on evaluation today patient appears to be doing very well in regard to her wounds in general. She's been making excellent progress at this time. Fortunately there's no sign of infection at this time either. No fevers, chills, nausea, or vomiting noted at this time. The biggest issue is still her left lateral malleolus where it appears to be doing well and is getting smaller but still shows a small corner where this is deeper and goes down into what appears to be the joint space. Nonetheless this is taking much longer to heal although it still looks better in smaller than previous evaluations. 01/24/19 on evaluation today patient's wounds actually appear to be doing rather well in general overall. She did require some sharp debridement in regard to the right great toe  but everything else appears to be doing excellent no debridement was even necessary. No fevers, chills, nausea, or vomiting noted at this time. 01/31/19 on evaluation today patient actually appears to be doing much better in regard to her left foot wound on the heel as well as the ankle. The right great toe appears to be a little bit worse today this had callous over and trapped a lot of fluid underneath. Fortunately there's no signs of infection at any site which is great news. 02/07/19 on evaluation today patient actually appears to be doing decently well in regard to all of her ulcers at this point. No sharp debridement was required she is a little bit of hyper granulation in regard to the left lateral ankle as well as the left heel but the hill itself is almost completely healed which is excellent news. Overall been very pleased in this regard. 02/14/19 on evaluation today patient actually appears to be doing very well in regard to her ulcers on the right first toe, left lateral malleolus, and left heel. In fact the heel is almost completely healed at this point. The patient does not show any signs of infection which is good news. Overall very pleased with how things have progressed. 04/18/19 Telehealth Evaluation During the COVID-19 National Emergency: Verbal Consent: Obtained from patient Allergies: reviewed and the active list is current. Medication changes: patient has no current medication changes. COVID-19 Screening: 1. Have you traveled internationally or on a cruise ship in the last 14 dayso No 2. Have you had contact with someone with or under investigation for COVID-19o No 3. Have you had a fever, cough, sore throat, or experiencing shortness of breatho No on evaluation today actually did have a visit with this patient through a telehealth encounter with her home health nurse. Subsequently it was noted that the patient actually appears to be doing okay in regard to her wounds both the  right great toe as well as the left lateral malleolus have shown signs of improvement although this in your theme around the left lateral malleolus there eschar coverings for both locations. The question is whether or not they are actually  close and whether or not home health needs to discharge the patient or not. Nonetheless my concern is this point obviously is that without actually seeing her and being able to evaluate this directly I cannot ensure that she is completely healed which is the question that I'm being asked. 04/22/19 on evaluation today patient presents for her first evaluation since last time I saw her which was actually February 14, 2019. I did do a telehealth visit last week in which point it was questionable whether or not she may be healed and had to bring her in today for confirmation. With that being said she does seem to be doing quite well at this point which is good news. There does not appear to be any drainage in the deed I believe her wounds may be healed. Readmission: 09/04/2019 on evaluation today patient appears to be doing unfortunately somewhat more poorly in regard to her left foot ulcer secondary to a wound that began on 08/21/2019 at least when she first noticed this. Fortunately she has not had any evidence of active infection at this time. Systemically. I also do not necessarily see any evidence of infection at the blister/wound site on the first metatarsal head plantar aspect. This almost appears to be something that may have just rubbed inappropriately causing this to breakdown. They did not want a wait too long to come in to be seen as again she had significant issues in the past with wounds that took quite a while to heal in fact it was close to 2 years. Nonetheless this does not appear to be quite that bad but again we do need to remove some of the necrotic tissue from the surface of the wound to tell exactly the extent. She does not appear to have any  significant arterial disease at this point and again her last ABIs and TBI's are recorded above in the alert section her left ABI was 1.27 with a TBI of 0.72 to the right ABI 1.08 with a TBI of 0.39. Other than this the patient has been doing quite well since I last saw her and that was in May 2020. 09/11/2019 on evaluation today patient appeared to be doing very well with regard to her plantar foot ulcer on the left. In fact this appears to be almost completely healed which is awesome. That is after just 1 week of intervention. With that being said there is no signs of active infection at this time. SUMEDHA, MUNNERLYN ([id number]) 09/18/2019 on evaluation today patient actually appears to be doing excellent in fact she is completely healed based on what I am seeing at this point. Fortunately there is no signs of active infection at this time and overall patient is very pleased to hear that this area has healed so quickly. Readmission: 05/13/2021 upon evaluation today this patient presents for reevaluation here in the clinic. This is a wound that actually we previously took care of. She had 1 on the right ankle and the left the left turned out to be be harder due to to heal but nonetheless is doing great at this point as the right that has reopened and it was noted first just several weeks ago with a scab over it and came off in just the past few days. Fortunately there does not appear to be any obvious evidence of significant active infection at this time which is great news. No fevers, chills, nausea, vomiting, or diarrhea. The patient does have a history of pacemaker along  with being on Eliquis currently as well. There does not appear to be any signs of this interfering in any way with her wound. She does have swelling we previously had compression socks for her ordered but again it does not look like she wears these on a regular basis by any means. 05/26/2021 upon evaluation today patient appears  to be doing well with regard to her wound which is actually showing signs of excellent improvement. There does not appear to be any signs of active infection which is great news and overall very pleased with where things stand today. No fevers, chills, nausea, vomiting, or diarrhea. 06/02/2021 upon evaluation today patient's wound actually showing signs of excellent improvement. Fortunately there does not appear to be any signs of active infection which is great news. I think the patient is making good progress with regard to her wounds in general. 06/09/2021 upon evaluation today patient appears to be doing excellent in regard to her wounds currently. Fortunately there does not appear to be any signs of active infection which is great news. No fevers, chills, nausea, vomiting, or diarrhea. Overall extremely pleased with where things stand today. I think the patient is making excellent progress. 06/16/2021 upon evaluation today patient appears to be doing well in regard to her wound. This is going require little bit of debridement today and that was discussed with the patient. Otherwise she seems to be doing quite well and I am actually very pleased with where things stand at this point. No fevers, chills, nausea, vomiting, or diarrhea. 06/23/2021 upon evaluation today patient appears to be doing well with regard to her wounds. She has been tolerating the dressing changes without complication. Fortunately there does not appear to be any evidence of infection and she has not had air in her home which she actually lives at an assisted living that got fixed this morning. With that being said because of that her wrap has been extremely hot and bothersome for her over the past week. 06/30/2021 upon evaluation today patient is actually making excellent progress in regard to her ankle ulcer. She has been tolerating the dressing changes without complication and overall extremely pleased with where things stand  there does not appear to be any evidence of active infection which is great news. No fevers, chills, nausea, vomiting, or diarrhea. 07/07/21 upon evaluation today patients and culture on the right actually appears to be doing quite well. There does not appear to be any signs of infection and overall very pleased with where things stand today. No fevers, chills, nausea, or vomiting noted at this time. 07/14/2021 unfortunately the patient today has some evidence of deep tissue injury and pressure getting to the ankle region. Again I am not exactly sure what is going on here but this is very similar to issues that we have had in the past. I explained to the patient that she needs to be very mindful of exactly what is happening I think sleeping in bed is probably the main issue here although there could be other culprits I am not sure what else would potentially lead to this kind of a problem for her. 07/21/2021 upon evaluation today patient's wound actually showing signs of improvement compared to last week. Fortunately there does not appear to be any signs of active infection which is great news and overall very pleased with where things stand in that regard. With that being said I do believe that she is continuing to show signs of overall of getting  better although I think this is still basically about what we were 2 weeks ago due to the worsening and now improvement. 07/28/2021 upon evaluation today patient appears to be doing well with regard to her wound. She does have some slough buildup on the surface of the wound which I would have to manage today. Fortunately there is no sign of active infection at this time. No fevers, chills, nausea, vomiting, or diarrhea. 08/04/2021 upon evaluation today patient appears to be doing about the same in regard to her wound. To be perfectly honest I am beginning to be a little bit concerned about the overall appearance of the wound bed. I do think possibly taking a  sample right around the margin of the wound could be beneficial for her as far as identifying anything such as an inflammatory process or to be honest even a skin tag cancer type process that may be of concern here. Fortunately there does not appear to be any evidence of active infection at this time which is great news she is not having any pain also great news. 08/11/2021 upon evaluation today patient appears to be doing well with regard to her wound. The good news is I did review her biopsy results and it showed some inflammatory mixed findings but nothing that appeared to be malignant which is great news. Overall this is more of a chronic venous stasis type issue which again is more what we have been treating. Nonetheless I just wanted to make sure before going forward that there was not anything more untoward going on at this point. 08/18/2021 upon evaluation today patient appears to be doing well with regard to her ankle ulcer. Fortunately there does not appear to be any signs of active infection at this time which is great overall wound is dramatically improved compared to last week. Since last week I have actually placed her on doxycycline and subsequently this is a good option as far as the findings are concerned at this point. 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 the doxycycline is doing that. There does not appear to be any evidence of active infection systemically which is great news. 08/25/2021 upon evaluation today patient appears to be doing well with regard to her wound. I feel like we are finally get back on track as far as healing is concerned I am much happier with the overall appearance today. I do think that she is tolerating the dressing changes without complication which is great news. We have been using Hydrofera Blue which I think is a good option.  The good news is she is also doing great in regard to her compression sock on the left which is a zipper compression that seems to be doing a great job keeping her edema under good control. 09/01/2021 upon evaluation today patient appears to be doing well with regard to her wound. Infection seems to be under much better control which is great news and very pleased in that regard. Fortunately there does not appear to be any signs of infection currently. [name], [name] ([id number]) 09/08/2021 upon evaluation today patient actually appears to be making good progress in regard to her wound. She has been tolerating the dressing changes without complication. Fortunately there does not appear to be any evidence of active infection at this time which is great news as well. No fevers,  chills, nausea, vomiting, or diarrhea. 09/22/2021 upon evaluation today patient appears to be doing well with regard to her wound although is very slow to heal. We have not looked into Apligraf yet I think that is something that we should see about doing. 09/29/2021 upon evaluation today patient's wound is actually showing signs of doing about the same. I am not seeing any evidence of worsening but also no significant evidence of improvement. We did gain approval for the organogenesis products all except for Apligraf as covered by her insurance. With that being said I do think that we can go ahead and proceed with the NuShield if the patient and her family in agreement of the plan I discussed that with him today she does have a 20% coinsurance which we also discussed. 10/06/2021 upon evaluation today patient appears to unfortunately be doing a little bit worse she appears to be infected based on what I am seeing. Fortunately there does not appear to be any signs of active infection at this time which is great news. Unfortunately it does appear to be some evidence of around the wound edge indicated by way of erythema and warmth  as well as redness 10/13/2021 upon evaluation today patient actually appears to be doing excellent in regard to her ankle ulcer compared to what it was. 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 I think were fine to go ahead and apply the NuShield today. Electronic Signature(s) Signed: 10/13/2021 5:03:17 PM By: Worthy Keeler PA-C Entered By: Worthy Keeler on 10/13/2021 17:03:17 [name], [name] ([id number]) -------------------------------------------------------------------------------- Physical Exam Details Patient Name: [name] Date of Service: 10/13/2021 10:15 AM Medical Record Number: [id number] Patient Account Number: [id number] Date of Birth/Sex: 03/28/1923 (87 y.o. F) Treating RN: Carlene Coria Primary Care Provider: Adrian Prows Other Clinician: Referring Provider: Adrian Prows Treating Provider/Extender: Skipper Cliche in Treatment: 21 Constitutional Well-nourished and well-hydrated in no acute distress. Respiratory normal breathing without difficulty. Psychiatric this patient is able to make decisions and demonstrates good insight into disease process. Alert and Oriented x 3. pleasant and cooperative. Notes Upon inspection patient's wound did require some sharp debridement to clear away some of the necrotic debris in preparation for application of NuShield. She tolerated that today without complication and postdebridement the wound bed appears to be doing much better. Electronic Signature(s) Signed: 10/13/2021 5:03:36 PM By: Worthy Keeler PA-C Entered By: Worthy Keeler on 10/13/2021 17:03:35 Cipriani, [name] ([id number]) -------------------------------------------------------------------------------- Physician Orders Details Patient Name:  [name] Date of Service: 10/13/2021 10:15 AM Medical Record Number: [id number] Patient Account Number: [id number] Date of Birth/Sex: 02-Jul-1923 (87 y.o. F) Treating RN: Carlene Coria Primary Care Provider: Adrian Prows Other Clinician: Referring Provider: Adrian Prows Treating Provider/Extender: Skipper Cliche in Treatment: 21 Verbal / Phone Orders: No Diagnosis Coding ICD-10 Coding Code Description 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, stage 3 I89.0 Lymphedema, not elsewhere classified I87.2 Venous insufficiency (chronic) (peripheral) Z95.0 Presence of cardiac pacemaker Z79.01 Long term (current) use of anticoagulants Follow-up Appointments o Return Appointment in 1 week. Bathing/ Shower/ Hygiene o May shower with wound dressing protected with water repellent cover or cast protector. Cellular or Tissue Based Products o Cellular or Tissue Based Product Type: - NuShield #1 o Cellular or Tissue Based Product applied to wound  bed; including contact layer, fixation with steri-strips, dry gauze and cover dressing. (DO NOT REMOVE). Edema Control - Lymphedema / Segmental Compressive Device / Other o Optional: One layer of unna paste to top of compression wrap (to act as an anchor). - and at toes o Elevate, Exercise Daily and Avoid Standing for Long Periods of Time. o Elevate legs to the level of the heart and pump ankles as often as possible o Elevate leg(s) parallel to the floor when sitting. Wound Treatment Wound #7 - Malleolus Wound Laterality: Right, Lateral Cleanser: Soap and Water 1 x Per Week/30 Days Discharge Instructions: Gently cleanse wound with antibacterial soap, rinse and pat dry prior to dressing wounds Secondary Dressing: Gauze 1 x Per Week/30 Days Discharge Instructions: As directed: dry, moistened with saline or moistened with Dakins Solution Compression Wrap: Profore Lite LF 3 Multilayer Compression Bandaging System 1 x Per  Week/30 Days Discharge Instructions: Apply 3 multi-layer wrap as prescribed. Electronic Signature(s) Signed: 10/13/2021 5:14:46 PM By: Worthy Keeler PA-C Signed: 10/14/2021 3:56:47 PM By: Carlene Coria RN Entered By: Carlene Coria on 10/13/2021 10:52:41 P[place], [name] ([id number]) -------------------------------------------------------------------------------- Problem List Details Patient Name: [name] Date of Service: 10/13/2021 10:15 AM Medical Record Number: [id number] Patient Account Number: [id number] Date of Birth/Sex: 1923-04-22 (87 y.o. 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, stage 3 05/13/2021 No Yes I89.0 Lymphedema, not elsewhere classified 05/13/2021 No Yes I87.2 Venous insufficiency (chronic) (peripheral) 05/13/2021 No Yes Z95.0 Presence of cardiac pacemaker 05/13/2021 No Yes Z79.01 Long term (current) use of anticoagulants 05/13/2021 No Yes Inactive Problems Resolved Problems Electronic Signature(s) Signed: 10/13/2021 10:20:16 AM By: Worthy Keeler PA-C Entered By: Worthy Keeler on 10/13/2021 10:20:16 [place], [name] ([id number]) -------------------------------------------------------------------------------- Progress Note Details Patient Name: [name] Date of Service: 10/13/2021 10:15 AM Medical Record Number: [id number] Patient Account Number: [id number] Date of Birth/Sex: [birth year] (87 y.o. 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 Dr. Amalia Hailey the podiatrist saw her on 07/20/2017 and sent her to the wound center for possible hyperbaric oxygen therapy. past medical history of peripheral vascular disease, varicose veins, status post appendectomy, basal cell carcinoma excision from the left leg, cholecystectomy, pacemaker placement, right lower extremity angiography done by Dr. dew in March 2017 with placement of a stent. there is also note of a successful ablation of the right small saphenous vein done which was reviewed by ultrasound on 10/24/2016. the patient had a right small saphenous vein ablation done on 10/20/2016. The patient has never been a smoker. She has been seen by Dr. Corene Cornea dew the vascular surgeon who most recently saw her on 06/15/2017 for evaluation of ongoing problems with right leg swelling. She had a lower extremity arterial duplex examination done(02/13/17) which showed patent distal right superficial femoral artery stent and above-the-knee popliteal stent without evidence of restenosis. The ABI was more than 1.3 on the right and more than 1.3 on the left. This was consistent with noncompressible  arteries due to medial calcification. The right great toe pressure and PPG waveforms are within normal limits and the left great toe pressure and PPG waveforms are decreased. he recommended she continue to wear her compression stockings and continue with elevation. She is scheduled to have a noninvasive arterial study in the near future 08/16/2017 -- had a lower extremity arterial duplex examination done which showed patent distal right superficial femoral artery stent and above-the-knee popliteal stent without evidence of restenosis. The ABI was more than 1.3 on the right and more than 1.3 on the left.  This was consistent with noncompressible arteries due to medial calcification. The right great toe pressure and PPG waveforms are within normal limits and the left great toe pressure and PPG waveforms are decreased. the x-ray of the right ankle has not yet been done 08/24/2017 -- had a right ankle x-ray -- IMPRESSION:1. No fracture, bone lesion or evidence of osteomyelitis. 2. Lateral soft tissue swelling with a soft tissue ulcer. she has not yet seen the vascular surgeon for review 08/31/17 on evaluation today patient's wound appears to be showing signs of improvement. She still with her appointment with vascular in order to review her results of her vascular study and then determine if any intervention would be recommended at that time. No fevers, chills, nausea, or vomiting noted at this time. She has been tolerating the dressing changes without complication. 09/28/17 on evaluation today patient's wound appears to show signs of good improvement in regard to the granulation tissue which is surfacing. There is still a layer of slough covering the wound and the posterior portion is still significantly deeper than the anterior nonetheless there has been some good sign of things moving towards the better. She is going to go back to Dr. dew for reevaluation to ensure her blood flow is still appropriate. That will be before her next evaluation with [place] next week. No fevers, chills, nausea, or vomiting noted at this time. Patient does have some discomfort rated to be a 3-4/10 depending on activity specifically cleansing the wound makes it worse. 10/05/2017 -- the patient was seen by Dr. Lucky Cowboy last week and noninvasive studies showed a normal right ABI with brisk triphasic waveforms consistent with no arterial insufficiency including normal digital pressures. The duplex showed a patent distal right SFA stent and the proximal SFA was also normal. He was pleased with her test and thought she should have enough  of perfusion for normal wound healing. He would see her back in 6 months time. 12/21/17 on evaluation today patient appears to be doing fairly well in regard to her right lateral ankle wound. Unfortunately the main issue that she is expansion at this point is that she is having some issues with what appears to be some cellulitis in the right anterior shin. She has also been noting a little bit of uncomfortable feeling especially last night and her ankle area. I'm afraid that she made the developing a little bit of an infection. With that being said I think it is in the early stages. 12/28/17 on evaluation today patient's ankle appears to be doing excellent. She's making good progress at this point the cellulitis seems to have improved after last week's evaluation. Overall she is having no significant discomfort which is excellent news. She does have an appointment with Dr. dew on March 29, 2018 for reevaluation in regard to the stent he placed. She seems to have excellent blood flow in the right lower extremity. 01/19/12 on evaluation today  patient's wound appears to be doing very well. In fact she does not appear to require debridement at this point, there's no evidence of infection, and overall from the standpoint of the wound she seems to be doing very well. With that being said I believe that it may be time to switch to different dressing away from the [facility] Dressing she tells me she does have a lot going on her friend actually passed away yesterday and she's also having a lot of issues with her husband this obviously is weighing heavy on her as far as your thoughts and concerns today. 01/25/18 on evaluation today patient appears to be doing fairly well in regard to her right lateral malleolus. She has been tolerating the dressing changes without complication. Overall I feel like this is definitely showing signs of improvement as far as how the overall appearance of the wound is there's also  evidence of epithelium start to migrate over the granulation tissue. In general I think that she is progressing nicely as far as the wound is concerned. The only concern she really has is whether or not we can switch to every other week visits in order to avoid having as many DIMITRA, WOODSTOCK ([id number]) appointments as her daughters have a difficult time getting her to her appointments as well as the patient's husband to his he is not doing very well at this point. 02/22/18 on evaluation today patient's right lateral malleolus ulcer appears to be doing great. She has been tolerating the dressing changes without complication. Overall you making excellent progress at this time. Patient is having no significant discomfort. 03/15/18 on evaluation today patient appears to be doing much more poorly in regard to her right lateral ankle ulcer at this point. Unfortunately since have last seen her her husband has passed just a few days ago is obviously weighed heavily on her her daughter also had surgery well she is with her today as usual. There does not appear to be any evidence of infection she does seem to have significant contusion/deep tissue injury to the right lateral malleolus which was not noted previous when I saw her last. It's hard to tell of exactly when this injury occurred although during the time she was spending the night in the hospital this may have been most likely. 03/22/18 on evaluation today patient appears to actually be doing very well in regard to her ulcer. She did unfortunately have a setback which was noted last week however the good news is we seem to be getting back on track and in fact the wound in the core did still have some necrotic tissue which will be addressed at this point today but in general I'm seeing signs that things are on the up and up. She is glad to hear this obviously she's been somewhat concerned that due to the how her wound digressed more recently. 03/29/18  on evaluation today patient appears to be doing fairly well in regard to her right lower extremity lateral malleolus ulcer. She unfortunately does have a new area of pressure injury over the inferior portion where the wound has opened up a little bit larger secondary to the pressure she seems to be getting. She does tell me sometimes when she sleeps at night that it actually hurts and does seem to be pushing on the area little bit more unfortunately. There does not appear to be any evidence of infection which is good news. She has been tolerating the dressing changes without complication.  She also did have some bruising in the left second and third toes due to the fact that she may have bump this or injured it although she has neuropathy so she does not feel she did move recently that may have been where this came from. Nonetheless there does not appear to be any evidence of infection at this time. 04/12/18 on evaluation today patient's wound on the right lateral ankle actually appears to be doing a little bit better with a lot of necrotic docking tissue centrally loosening up in clearing away. However she does have the beginnings of a deep tissue injury on the left lateral malleolus likely due to the fact we've been trying offload the right as much as we have. I think she may benefit from an assistive soft device to help with offloading and it looks like they're looking at one of the doughnut conditions that wraps around the lower leg to offload which I think will definitely do a good job. With that being said I think we definitely need to address this issue on the left before it becomes a wound. Patient is not having significant pain. 04/19/18 on evaluation today patient appears to be doing excellent in regard to the progress she's made with her right lateral ankle ulcer. The left ankle region which did show evidence of a deep tissue injury seems to be resolving there's little fluid noted underneath  and a blister there's nothing open at this point in time overall I feel like this is progressing nicely which is good news. She does not seem to be having significant discomfort at this point which is also good news. 04/25/18-She is here in follow up evaluation for bilateral lateral malleolar ulcers. The right lateral malleolus ulcer with pale subcutaneous tissue exposure, central area of ulcer with tendon/periosteum exposed. The left lateral malleolus ulcer now with central area of nonviable tissue, otherwise deep tissue injury. She is wearing compression wraps to the left lower extremity, she will place the right lower extremity compression wraps on when she gets home. She will be out of town over the weekend and return next week and follow-up appointment. She completed her doxycycline this morning 05/03/18 on evaluation today patient appears to be doing very well in regard to her right lateral ankle ulcer in general. At least she's showing some signs of improvement in this regard. Unfortunately she has some additional injury to the left lateral malleolus region which appears to be new likely even over the past several days. Again this determination is based on the overall appearance. With that being said the patient is obviously frustrated about this currently. 05/10/18-She is here in follow-up evaluation for bilateral lateral malleolar ulcers. She states she has purchased offloading shoes/boots and they will arrive tomorrow. She was asked to bring them in the office at next week's appointment so her provider is aware of product being utilized. She continues to sleep on right or left side, she has been encouraged to sleep on her back. The right lateral malleolus ulcer is precariously close to peri-osteum; will order xray. The left lateral malleolus ulcer is improved. Will switch back to santyl; she will follow up next week. 05/17/18 on evaluation today patient actually appears to be doing very well in  regard to her malleolus her ulcers compared to last time I saw them. She does not seem to have as much in the way of contusion at this point which is great news. With that being said she does continue to have  discomfort and I do believe that she is still continuing to benefit from the offloading/pressure reducing boots that were recommended. I think this is the key to trying to get this to heal up completely. 05/24/18 on evaluation today patient actually appears to be doing worse at this point in time unfortunately compared to her last week's evaluation. She is having really no increased pain which is good news unfortunately she does have more maceration in your theme and noted surrounding the right lateral ankle the left lateral ankle is not really is erythematous I do not see signs of the overt cellulitis on that side. Unfortunately the wounds do not seem to have shown any signs of improvement since the last evaluation. She also has significant swelling especially on the right compared to previous some of this may be due to infection however also think that she may be served better while she has these wounds by compression wrapping versus continuing to use the Juxta-Lite for the time being. Especially with the amount of drainage that she is experiencing at this point. No fevers, chills, nausea, or vomiting noted at this time. 05/31/18 on evaluation today patient appears to actually be doing better in regard to her right lateral lower extremity ulcer specifically on the malleolus region. She has been tolerating the antibiotic without complication. With that being said she still continues to have issues but a little bit of redness although nothing like she what she was experiencing previous. She still continues to pressure to her ankle area she did get the problem on offloading boots unfortunately she will not wear them she states there too uncomfortable and she can't get in and out of the bed.  Nonetheless at this point her wounds seem to be continually getting worse which is not what we want I'm getting somewhat concerned about her progress and how things are going to proceed if we do not intervene in some way shape or form. I therefore had a very lengthy conversation today about offloading yet again and even made a specific suggestion for switching her to a memory foam mattress and even gave the information for a specific one that they could look at getting if it was something that they were interested in considering. She does not want to be considered for a hospital bed air mattress although honestly insurance would not cover it that she does not have any wounds on her trunk. 06/14/18 on evaluation today both wounds over the bilateral lateral malleolus her ulcers appear to be doing better there's no evidence of pressure injury at this point. She did get the foam mattress for her bed and this does seem to have been extremely beneficial for her in my pinion. Her daughter states that she is having difficulty getting out of bed because of how soft it is. The patient also relates this to be. Nonetheless I do feel like she's actually doing better. Unfortunately right after and around the time she was getting the mattress she also sustained a fall when she got up to go pick up the phone and ended up injuring her right elbow she has 18 sutures in place. We are not caring for this currently although home health is going to be taking the sutures out shortly. Nonetheless this may be something that we need to evaluate going forward. It depends on [name], [name] ([id number]) how well it has or has not healed in the end. She also recently saw an orthopedic specialist for an injection in the right  shoulder just before her fall unfortunately the fall seems to have worsened her pain. 06/21/18 on evaluation today patient appears to be doing about the same in regard to her lateral malleolus ulcers. Both  appear to be just a little bit deeper but again we are clinging away the necrotic and dead tissue which I think is why this is progressing towards a deeper realm as opposed to improving from my measurement standpoint in that regard. Nonetheless she has been tolerating the dressing changes she absolutely hates the memory foam mattress topper that was obtained for her nonetheless I do believe this is still doing excellent as far as taking care of excess pressure in regard to the lateral malleolus regions. She in fact has no pressure injury that I see whereas in weeks past it was week by week I was constantly seeing new pressure injuries. Overall I think it has been very beneficial for her. 07/03/18; patient arrives in my clinic today. She has deep punched out areas over her bilateral lateral malleoli. The area on the right has some more depth. We spent a lot of time today talking about pressure relief for these areas. This started when her daughter asked for a prescription for a memory foam mattress. I have never written a prescription for a mattress and I don't think insurances would pay for that on an ordinary bed. In any case he came up that she has foam boots that she refuses to wear. I would suggest going to these before any other offloading issues when she is in bed. They say she is meticulous about offloading this the rest of the day 07/10/18- She is seen in follow-up evaluation for bilateral, lateral malleolus ulcers. There is no improvement in the ulcers. She has purchased and is sleeping on a memory foam mattress/overlay, she has been using the offloading boots nightly over the past week. She has a follow up appointment with vascular medicine at the end of October, in my opinion this follow up should be expedited given her deterioration and suboptimal TBI results. We will order plain film xray of the left ankle as deeper structures are palpable; would consider having MRI, regardless of xray  report(s). The ulcers will be treated with iodoflex/iodosorb, she is unable to safely change the dressings daily with santyl. 07/19/18 on evaluation today patient appears to be doing in general visually well in regard to her bilateral lateral malleolus ulcers. She has been tolerating the dressing changes without complication which is good news. With that being said we did have an x-ray performed on 07/12/18 which revealed a slight loosen see in the lateral portion of the distal left fibula which may represent artifact but underline lytic destruction or osteomyelitis could not be excluded. MRI was recommended. With that being said we can see about getting the patient scheduled for an MRI to further evaluate this area. In fact we have that scheduled currently for August 20 19,019. 07/26/18 on evaluation today patient's wound on the right lateral ankle actually appears to be doing fairly well at this point in my pinion. She has made some good progress currently. With that being said unfortunately in regard to the left lateral ankle ulcer this seems to be a little bit more problematic at this time. In fact as I further evaluated the situation she actually had bone exposed which is the first time that's been the case in the bone appear to be necrotic. Currently I did review patient's note from Dr. Bunnie Domino office with [facility]. He stated that ABI was 1.26 on the right and 0.95 on the left with good waveforms. Her perfusion is stable not reduced from previous studies and her digital waveforms were pretty good particularly on the right. His conclusion upon review of the note was that there was not much she could do to improve her perfusion and he felt she was adequate for wound healing. His suggestion was that she continued to see [place] and consider a synthetic skin graft if there was no underlying infection. He plans to see her back in six months or as needed. 08/01/18 on evaluation today  patient appears to be doing better in regard to her right lateral ankle ulcer. Her left lateral ankle ulcer is about the same she still has bone involvement in evidence of necrosis. There does not appear to be evidence of infection at this time On the right lateral lower extremity. I have started her on the Augmentin she picked this up and started this yesterday. This is to get her through until she sees infectious disease which is scheduled for 08/12/18. 08/06/18 on evaluation today patient appears to be doing rather well considering my discussion with patient's daughter at the end of last week. The area which was marked where she had erythema seems to be improved and this is good news. With that being said overall the patient seems to be making good improvement when it comes to the overall appearance of the right lateral ankle ulcer although this has been slow she at least is coming around in this regard. Unfortunately in regard to the left lateral ankle ulcer this is osteomyelitis based on the pathology report as well is bone culture. Nonetheless we are still waiting CT scan. Unfortunately the MRI we originally ordered cannot be performed as the patient is a pacemaker which I had overlooked. Nonetheless we are working on the CT scan approval and scheduling as of now. She did go to the hospital over the weekend and was placed on IV Cefzo for a couple of days. Fortunately this seems to have improved the erythema quite significantly which is good news. There does not appear to be any evidence of worsening infection at this time. She did have some bleeding after the last debridement therefore I did not perform any sharp debridement in regard to left lateral ankle at this point. Patient has been approved for a snap vac for the right lateral ankle. 08/14/18; the patient with wounds over her bilateral lateral malleoli. The area on the right actually looks quite good. Been using a snap back on this area.  Healthy granulation and appears to be filling in. Unfortunately the area on the left is really problematic. 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 Also noted to have cellulitis. She saw Dr. Novella Olive of infectious disease today and was put on linezolid. We are able to verify this with her pharmacy. She is completed the Augmentin that she was already on. We've been using Iodoflex to this area 08/23/18 on evaluation today patient's wounds both actually appear to be doing better compared to my prior evaluations. Fortunately she showing signs of good improvement in regard to the overall wound status especially where were using the snap vac on the right. In regard to left lateral malleolus the wound bed actually appears to be much cleaner than previously noted. I do not feel any phone directly probed during evaluation today  and though there is tendon noted this does not appear to be necrotic it's actually fairly good as far as the overall appearance of the tendon is concerned. In general the wound bed actually appears to be doing significantly better than it was previous. Patient is currently in the care of [name] and I did review that note today. He actually has her on two weeks of linezolid and then following the patient will be on 1-2 months of Keflex. That is the plan currently. She has been on antibiotics therapy as prescribed by myself initially starting on July 30, 2018 and has been on that continuously up to this point. 08/30/18 on evaluation today patient actually appears to be doing much better in regard to her right lateral malleolus ulcer. She has been tolerating the dressing changes specifically the snap vac without complication although she did have some issues with the seal currently. Apparently there was some trouble with getting it to maintain over the past week past Sunday. Nonetheless overall the wound  appears better in regard to the right lateral malleolus region. In regard to left lateral malleolus this actually show some signs of additional granulation although there still tendon noted in the base of the wound this appears to be healthy not necrotic in any way whatsoever. We are considering potentially using a snap vac for the left lateral malleolus as well the product wrap from [employer], [place], was present in the clinic today we're going to see this patient I did have her come in with me after obtaining consent from the patient and her daughter in order to look at the wound and see if there's any recommendation one way or another as to whether or not they felt the snapback could be beneficial for the left lateral malleolus region. But CENA, BRUHN ([id number]) the conclusion was that it might be but that this is definitely a little bit deeper wound than what traditionally would be utilized for a snap vac. 09/06/18 on evaluation today patient actually appears to be doing excellent in my pinion in regard to both ankle ulcers. She has been tolerating the dressing changes without complication which is great news. Specifically we have been using the snap vac. In regard to the right ankle I'm not even sure that this is going to be necessary for today and following as the wound has filled in quite nicely. In regard to the left ankle I do believe that we're seeing excellent epithelialization from the edge as well as granulation in the central portion the tendon is still exposed but there's no evidence of necrotic bone and in general I feel like the patient has made excellent progress even compared to last week with just one week of the snap vac. 09/11/18; this is a patient who has wounds on her bilateral lateral malleoli. Initially both of these were deep stage IV wounds in the setting of chronic arterial insufficiency. She has been revascularized. As I understand think she been using snap vacs to both  of these wounds however the area on the right became more superficial and currently she is only using it on the left. Using silver collagen on the right and silver collagen under the back on the left I believe 09/19/18 on evaluation today patient actually appears to be doing very well in regard to her lateral malleolus or ulcers bilaterally. She has been tolerating the dressing changes without complication. Fortunately there does not appear to be any evidence of infection at this time. Overall  I feel like she is improving in an excellent manner and I'm very pleased with the fact that everything seems to be turning towards the better for her. This has obviously been a long road. 09/27/18 on evaluation today patient actually appears to be doing very well in regard to her bilateral lateral malleolus ulcers. She has been tolerating the dressing changes without complication. Fortunately there does not appear to be any evidence of infection at this time which is also great news. No fevers, chills, nausea, or vomiting noted at this time. Overall I feel like she is doing excellent with the snap vac on the left malleolus. She had 40 mL of fluid collection over the past week. 10/04/18 on evaluation today patient actually appears to be doing well in regard to her bilateral lateral malleolus ulcers. She continues to tolerate the dressing changes without complication. One issue that I see is the snap vac on the left lateral malleolus which appears to have sealed off some fluid underlying this area and has not really allowed it to heal to the degree that I would like to see. For that reason I did suggest at this point we may want to pack a small piece of packing strip into this region to allow it to more effectively wick out fluid. 10/11/18 in general the patient today does not feel that she has been doing very well. She's been a little bit lethargic and subsequently is having bodyaches as well according to what she  tells me today. With that being said overall she has been concerned with the fact that something may be worsening although to be honest her wounds really have not been appearing poorly. She does have a new ulcer on her left heel unfortunately. This may be pressure related. Nonetheless it seems to me to have potentially started at least as a blister I do not see any evidence of deep tissue injury. In regard to the left ankle the snap vac still seems to be causing the ceiling off of the deeper part of the wound which is in turn trapping fluid. I'm not extremely pleased with the overall appearance as far as progress from last week to this week therefore I'm gonna discontinue the snap vac at this point. 10/18/18 patient unfortunately this point has not been feeling well for the past several days. She was seen by Grayland Ormond her primary care provider who is a Librarian, academic at [facility]. Subsequently she states that she's been very weak and generally feeling malaise. No fevers, chills, nausea, or vomiting noted at this time. With that being said bloodwork was performed at the PCP office on the 11th of this month which showed a white blood cell count of 10.7. This was repeated today and shows a white blood cell count of 12.4. This does show signs of worsening. Coupled with the fact that she is feeling worse and that her left ankle wound is not really showing signs of improvement I feel like this is an indication that the osteomyelitis is likely exacerbating not improving. Overall I think we may also want to check her C-reactive protein and sedimentation rate. Actually did call [facility] Fleet office this afternoon while the patient was in the office here with me. Subsequently based on the findings we discussed treatment possibilities and I think that it is appropriate for [place] to go ahead and initiate treatment with doxycycline which I'm going to do. Subsequently he did agree to see about adding  a CRP and sedimentation  rate to her orders. If that has not already been drawn to where they can run it they will contact the patient she can come back to have that check. They are in agreement with plan as far as the patient and her daughter are concerned. Nonetheless also think we need to get in touch with Dr. Henreitta Leber office to see about getting the patient scheduled with him as soon as possible. 11/08/18 on evaluation today patient presents for follow-up concerning her bilateral foot and ankle ulcers. I did do an extensive review of her chart in epic today. Subsequently she was seen by [name] he did initiate Cefepime IV antibiotic therapy. Subsequently she had some issues with her PICC line this had to be removed because it was coiled and then replaced. Fortunately that was now settled. Unfortunately she has continued have issues with her left heel as well as the issues that she is experiencing with her bilateral lateral malleolus regions. I do believe however both areas seem to be doing a little bit better on evaluation today which is good news. No fevers, chills, nausea, or vomiting noted at this time. She actually has an angiogram schedule with Dr. dew on this coming Monday, November 11, 2018. Subsequently the patient states that she is feeling much better especially than what she was roughly 2 weeks ago. She actually had to cancel an appointment because she was feeling so poorly. No fevers, chills, nausea, or vomiting noted at this time. 11/15/18 on evaluation today patient actually is status post having had her angiogram with Dr. dew Monday, four days ago. 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 He had to go and work on several areas of the vasculature fortunately he was able to obtain no more than a 30% residual stenosis throughout post procedure. I reviewed this note today. I think this will definitely help with healing at this time. Fortunately there does not  appear to be any signs of infection and I do feel like ratio already has a better appearance to it. 11/22/18 upon evaluation today patient actually appears to be doing very well in regard to her wounds in general. The right lateral malleolus looks excellent the heel looks better in the left lateral malleolus also appears to be doing a little better. With that being said the right second toe actually appears to be open and training we been watching this is been dry and stable but now is open. 12/03/2018 Seen today for follow-up and management of multiple bilateral lower extremity wounds. New pressure injury of the great toe which is closed at this time. Wound of the right distal second toe appears larger today with deep undermining and a pocket of fluid present within the undermining region. Left and right malleolus is wounds are stable today with no signs and symptoms of infection.Denies any needs or concerns during exam today. 12/13/18 on evaluation today patient appears to be doing somewhat better in regard to her left heel ulcer. She also seems to be completely healed in regard to the right lateral malleolus ulcer. The left malleolus ulcer is smaller what unfortunately the wounds which are new over the first and second toes of the right foot are what are most concerning at this point especially the second. Both areas did require sharp debridement today. 12/20/18 on evaluation today patient's wound actually appears to be doing better in regard to left lateral ankle and her right lateral ankle continues to remain healed. The  hill ulcer on the left is improved. She does have improvement noted as well in regard to both toe ulcers. Overall I'm very pleased in this regard. No fevers, chills, nausea, or vomiting noted at this time. LAVANNA, ROG ([id number]) 12/23/18 on evaluation today patient is seen after she had her toenails trimmed at the podiatrist office due to issues with her right great toe.  There was what appeared to be dark eschar on the surface of the wound which had her in the podiatrist concerned. Nonetheless as I remember that during the last office visit I had utilize silver nitrate of this area I was much less concerned about the situation. Subsequently I was able to clean off much of this tissue without any complication today. This does not appear to show any signs of infection and actually look somewhat better compared to last time post debridement. Her second toe on the right foot actually had callous over and there did appear still be some fluid underneath this that would require debridement today. 12/27/18 on evaluation today patient actually appears to be showing signs of improvement at all locations. Even the left lateral ankle although this is not quite as great as the other sites. Fortunately there does not appear to be any signs of infection at this time and both of her toes on the right foot seem to be showing signs of improvement which is good news and very pleased in this regard. 01/03/19 on evaluation today patient appears to be doing better for the most part in regard to her wounds in particular. There does not appear to be any evidence of infection at this time which is good news. Fortunately there is no sign of really worsening anywhere except for the right great toe which she does have what appears to be a bruise/deep tissue injury which is very superficial and already resolving. I'm not sure where this came from I questioned her extensively and she does not recall what may have happened with this. Other than that the patient seems to be doing well even the left lateral ankle ulcer looks good and is getting smaller. 01/10/19 on evaluation today patient appears to be doing well in regard to her left heel wound and both of her toe wounds. Overall I feel like there is definitely improvement here and I'm happy in that regard. With that being said unfortunately she is  having issues with the left lateral malleolus ulcer which unfortunately still has a lot of depth to it. This is gonna be a very difficult wound for [place] to be able to truly get to heal. I may want to consider some type of skin substitute to see if this would be of benefit for her. I'll discuss this with her more the next visit most likely. This was something I thought about more at the end of the visit when I was Artie out of the room and the patient had been discharged. 01/17/19 on evaluation today patient appears to be doing very well in regard to her wounds in general. She's been making excellent progress at this time. Fortunately there's no sign of infection at this time either. No fevers, chills, nausea, or vomiting noted at this time. The biggest issue is still her left lateral malleolus where it appears to be doing well and is getting smaller but still shows a small corner where this is deeper and goes down into what appears to be the joint space. Nonetheless this is taking much longer to heal although  it still looks better in smaller than previous evaluations. 01/24/19 on evaluation today patient's wounds actually appear to be doing rather well in general overall. She did require some sharp debridement in regard to the right great toe but everything else appears to be doing excellent no debridement was even necessary. No fevers, chills, nausea, or vomiting noted at this time. 01/31/19 on evaluation today patient actually appears to be doing much better in regard to her left foot wound on the heel as well as the ankle. The right great toe appears to be a little bit worse today this had callous over and trapped a lot of fluid underneath. Fortunately there's no signs of infection at any site which is great news. 02/07/19 on evaluation today patient actually appears to be doing decently well in regard to all of her ulcers at this point. No sharp debridement was required she is a little bit of hyper  granulation in regard to the left lateral ankle as well as the left heel but the hill itself is almost completely healed which is excellent news. Overall been very pleased in this regard. 02/14/19 on evaluation today patient actually appears to be doing very well in regard to her ulcers on the right first toe, left lateral malleolus, and left heel. In fact the heel is almost completely healed at this point. The patient does not show any signs of infection which is good news. Overall very pleased with how things have progressed. 04/18/19 Telehealth Evaluation During the COVID-19 National Emergency: Verbal Consent: Obtained from patient Allergies: reviewed and the active list is current. Medication changes: patient has no current medication changes. COVID-19 Screening: 1. Have you traveled internationally or on a cruise ship in the last 14 dayso No 2. Have you had contact with someone with or under investigation for COVID-19o No 3. Have you had a fever, cough, sore throat, or experiencing shortness of breatho No on evaluation today actually did have a visit with this patient through a telehealth encounter with her home health nurse. Subsequently it was noted that the patient actually appears to be doing okay in regard to her wounds both the right great toe as well as the left lateral malleolus have shown signs of improvement although this in your theme around the left lateral malleolus there eschar coverings for both locations. The question is whether or not they are actually close and whether or not home health needs to discharge the patient or not. Nonetheless my concern is this point obviously is that without actually seeing her and being able to evaluate this directly I cannot ensure that she is completely healed which is the question that I'm being asked. 04/22/19 on evaluation today patient presents for her first evaluation since last time I saw her which was actually February 14, 2019. I did do  a telehealth visit last week in which point it was questionable whether or not she may be healed and had to bring her in today for confirmation. With that being said she does seem to be doing quite well at this point which is good news. There does not appear to be any drainage in the deed I believe her wounds may be healed. Readmission: 09/04/2019 on evaluation today patient appears to be doing unfortunately somewhat more poorly in regard to her left foot ulcer secondary to a wound that began on 08/21/2019 at least when she first noticed this. Fortunately she has not had any evidence of active infection at this time. Systemically. I also  do not necessarily see any evidence of infection at the blister/wound site on the first metatarsal head plantar aspect. This almost appears to be something that may have just rubbed inappropriately causing this to breakdown. They did not want a wait too long to come in to be seen as again she had significant issues in the past with wounds that took quite a while to heal in fact it was close to 2 years. Nonetheless this does not appear to be quite that bad but again we do need to remove some of the necrotic tissue from the surface of the wound to tell exactly the extent. She does not appear to have any significant arterial disease at this point and again her last ABIs and TBI's are recorded above in the alert section her left ABI was 1.27 with a TBI of 0.72 to the right ABI 1.08 with a TBI of 0.39. Other than this the patient has been doing quite REBEL, LAUGHRIDGE ([id number]) well since I last saw her and that was in May 2020. 09/11/2019 on evaluation today patient appeared to be doing very well with regard to her plantar foot ulcer on the left. In fact this appears to be almost completely healed which is awesome. That is after just 1 week of intervention. With that being said there is no signs of active infection at this time. 09/18/2019 on evaluation today patient  actually appears to be doing excellent in fact she is completely healed based on what I am seeing at this point. Fortunately there is no signs of active infection at this time and overall patient is very pleased to hear that this area has healed so quickly. Readmission: 05/13/2021 upon evaluation today this patient presents for reevaluation here in the clinic. This is a wound that actually we previously took care of. She had 1 on the right ankle and the left the left turned out to be be harder due to to heal but nonetheless is doing great at this point as the right that has reopened and it was noted first just several weeks ago with a scab over it and came off in just the past few days. Fortunately there does not appear to be any obvious evidence of significant active infection at this time which is great news. No fevers, chills, nausea, vomiting, or diarrhea. 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 There does not appear to be any signs of this interfering in any way with her wound. She does have swelling we previously had compression socks for her ordered but again it does not look like she wears these on a regular basis by any means. 05/26/2021 upon evaluation today patient appears to be doing well with regard to her wound which is actually showing signs of excellent improvement. There does not appear to be any signs of active infection which is great news and overall very pleased with where things stand today. No fevers, chills, nausea, vomiting, or diarrhea. 06/02/2021 upon evaluation today patient's wound actually showing signs of excellent improvement. Fortunately there does not appear to be any signs of active infection which is great news. I think the patient is making good progress with regard to her wounds in general. 06/09/2021 upon evaluation today patient appears to be doing excellent in regard to her wounds currently. Fortunately there does  not appear to be any signs of active infection which is great news. No fevers, chills, nausea, vomiting, or diarrhea. Overall  extremely pleased with where things stand today. I think the patient is making excellent progress. 06/16/2021 upon evaluation today patient appears to be doing well in regard to her wound. This is going require little bit of debridement today and that was discussed with the patient. Otherwise she seems to be doing quite well and I am actually very pleased with where things stand at this point. No fevers, chills, nausea, vomiting, or diarrhea. 06/23/2021 upon evaluation today patient appears to be doing well with regard to her wounds. She has been tolerating the dressing changes without complication. Fortunately there does not appear to be any evidence of infection and she has not had air in her home which she actually lives at an assisted living that got fixed this morning. With that being said because of that her wrap has been extremely hot and bothersome for her over the past week. 06/30/2021 upon evaluation today patient is actually making excellent progress in regard to her ankle ulcer. She has been tolerating the dressing changes without complication and overall extremely pleased with where things stand there does not appear to be any evidence of active infection which is great news. No fevers, chills, nausea, vomiting, or diarrhea. 07/07/21 upon evaluation today patients and culture on the right actually appears to be doing quite well. There does not appear to be any signs of infection and overall very pleased with where things stand today. No fevers, chills, nausea, or vomiting noted at this time. 07/14/2021 unfortunately the patient today has some evidence of deep tissue injury and pressure getting to the ankle region. Again I am not exactly sure what is going on here but this is very similar to issues that we have had in the past. I explained to the patient that she needs  to be very mindful of exactly what is happening I think sleeping in bed is probably the main issue here although there could be other culprits I am not sure what else would potentially lead to this kind of a problem for her. 07/21/2021 upon evaluation today patient's wound actually showing signs of improvement compared to last week. Fortunately there does not appear to be any signs of active infection which is great news and overall very pleased with where things stand in that regard. With that being said I do believe that she is continuing to show signs of overall of getting better although I think this is still basically about what we were 2 weeks ago due to the worsening and now improvement. 07/28/2021 upon evaluation today patient appears to be doing well with regard to her wound. She does have some slough buildup on the surface of the wound which I would have to manage today. Fortunately there is no sign of active infection at this time. No fevers, chills, nausea, vomiting, or diarrhea. 08/04/2021 upon evaluation today patient appears to be doing about the same in regard to her wound. To be perfectly honest I am beginning to be a little bit concerned about the overall appearance of the wound bed. I do think possibly taking a sample right around the margin of the wound could be beneficial for her as far as identifying anything such as an inflammatory process or to be honest even a skin tag cancer type process that may be of concern here. Fortunately there does not appear to be any evidence of active infection at this time which is great news she is not having any pain also great news. 08/11/2021 upon evaluation today patient  appears to be doing well with regard to her wound. The good news is I did review her biopsy results and it showed some inflammatory mixed findings but nothing that appeared to be malignant which is great news. Overall this is more of a chronic venous stasis type issue which again  is more what we have been treating. Nonetheless I just wanted to make sure before going forward that there was not anything more untoward going on at this point. 08/18/2021 upon evaluation today patient appears to be doing well with regard to her ankle ulcer. Fortunately there does not appear to be any signs of active infection at this time which is great overall wound is dramatically improved compared to last week. Since last week I have actually placed her on doxycycline and subsequently this is a good option as far as the findings are concerned at this point. 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 the doxycycline is doing that. There does not appear to be any evidence of active infection systemically which is great news. 08/25/2021 upon evaluation today patient appears to be doing well with regard to her wound. I feel like we are finally get back on track as far as healing is concerned I am much happier with the overall appearance today. I do think that she is tolerating the dressing changes without complication which is great news. We have been using Hydrofera Blue which I think is a good option. The good news is she is also doing great in [place], TELEAH VILLAMAR. ([id number]) regard to her compression sock on the left which is a zipper compression that seems to be doing a great job keeping her edema under good control. 09/01/2021 upon evaluation today patient appears to be doing well with regard to her wound. Infection seems to be under much better control which is great news and very pleased in that regard. Fortunately there does not appear to be any signs of infection currently. 09/08/2021 upon evaluation today patient actually appears to be making good progress in regard to her wound. She has been tolerating the dressing changes without complication. Fortunately there does  not appear to be any evidence of active infection at this time which is great news as well. No fevers, chills, nausea, vomiting, or diarrhea. 09/22/2021 upon evaluation today patient appears to be doing well with regard to her wound although is very slow to heal. We have not looked into Apligraf yet I think that is something that we should see about doing. 09/29/2021 upon evaluation today patient's wound is actually showing signs of doing about the same. I am not seeing any evidence of worsening but also no significant evidence of improvement. We did gain approval for the organogenesis products all except for Apligraf as covered by her insurance. With that being said I do think that we can go ahead and proceed with the NuShield if the patient and her family in agreement of the plan I discussed that with him today she does have a 20% coinsurance which we also discussed. 10/06/2021 upon evaluation today patient appears to unfortunately be doing a little bit worse she appears to be infected based on what I am seeing. Fortunately there does not appear to be any signs of active infection at this time which is great news. Unfortunately it does appear to be some evidence of around the wound  edge indicated by way of erythema and warmth as well as redness 10/13/2021 upon evaluation today patient actually appears to be doing excellent in regard to her ankle ulcer compared to what it was. 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 I think were fine to go ahead and apply the NuShield today. Objective Constitutional Well-nourished and well-hydrated in no acute distress. Vitals Time Taken: 10:20 AM, Height: 62 in, Weight: 150 lbs, BMI: 27.4, Temperature: 98.1 F, Pulse: 74 bpm, Respiratory Rate: 18 breaths/min, Blood  Pressure: 145/80 mmHg. Respiratory normal breathing without difficulty. Psychiatric this patient is able to make decisions and demonstrates good insight into disease process. Alert and Oriented x 3. pleasant and cooperative. General Notes: Upon inspection patient's wound did require some sharp debridement to clear away some of the necrotic debris in preparation for application of NuShield. She tolerated that today without complication and postdebridement the wound bed appears to be doing much better. Integumentary (Hair, Skin) Wound #7 status is Open. Original cause of wound was Gradually Appeared. The date acquired was: 05/12/2021. The wound has been in treatment 21 weeks. The wound is located on the Right,Lateral Malleolus. The wound measures 1cm length x 1cm width x 0.3cm depth; 0.785cm^2 area and 0.236cm^3 volume. There is Fat Layer (Subcutaneous Tissue) exposed. There is no tunneling or undermining noted. There is a medium amount of serosanguineous drainage noted. The wound margin is distinct with the outline attached to the wound base. There is medium (34-66%) red, pink, pale granulation within the wound bed. There is a medium (34-66%) amount of necrotic tissue within the wound bed. Assessment Active Problems ICD-10 Pressure ulcer of right ankle, stage 3 Lymphedema, not elsewhere classified Venous insufficiency (chronic) (peripheral) Presence of cardiac pacemaker Long term (current) use of anticoagulants [place], [name] ([id number]) Procedures Wound #7 Pre-procedure diagnosis of Wound #7 is a Pressure Ulcer located on the Right,Lateral Malleolus . There was a Excisional Skin/Subcutaneous Tissue Debridement with a total area of 1 sq cm performed by Tommie Sams., PA-C. With the following instrument(s): Curette to remove Viable and Non-Viable tissue/material. Material removed includes Subcutaneous Tissue, Slough, Skin: Dermis, and Skin: Epidermis. No specimens were taken. A time out  was conducted at 10:45, prior to the start of the procedure. A Minimum amount of bleeding was controlled with Pressure. The procedure was tolerated well with a pain level of 0 throughout and a pain level of 0 following the procedure. Post Debridement Measurements: 1cm length x 1cm width x 0.3cm depth; 0.236cm^3 volume. Post debridement Stage noted as Category/Stage III. Character of Wound/Ulcer Post Debridement is improved. Post procedure Diagnosis Wound #7: Same as Pre-Procedure Pre-procedure diagnosis of Wound #7 is a Pressure Ulcer located on the Right,Lateral Malleolus. A skin graft procedure using a bioengineered skin substitute/cellular or tissue based product was performed by Tommie Sams., PA-C with the following instrument(s): Forceps and Scissors. Nushield was applied and secured with Steri-Strips. 1.6 sq cm of product was utilized and 0 sq cm was wasted. Post Application, adaptic was applied. A Time Out was conducted at 10:55, prior to the start of the procedure. The procedure was tolerated well with a pain level of 0 throughout and a pain level of 0 following the procedure. Post procedure Diagnosis Wound #7: Same as Pre-Procedure . Plan Follow-up Appointments: Return  Appointment in 1 week. Bathing/ Shower/ Hygiene: May shower with wound dressing protected with water repellent cover or cast protector. Cellular or Tissue Based Products: Cellular or Tissue Based Product Type: - NuShield #1 Cellular or Tissue Based Product applied to wound bed; including contact layer, fixation with steri-strips, dry gauze and cover dressing. (DO NOT REMOVE). Edema Control - Lymphedema / Segmental Compressive Device / Other: Optional: One layer of unna paste to top of compression wrap (to act as an anchor). - and at toes Elevate, Exercise Daily and Avoid Standing for Long Periods of Time. Elevate legs to the level of the heart and pump ankles as often as possible Elevate leg(s) parallel to the floor  when sitting. WOUND #7: - Malleolus Wound Laterality: Right, Lateral Cleanser: Soap and Water 1 x Per Week/30 Days Discharge Instructions: Gently cleanse wound with antibacterial soap, rinse and pat dry prior to dressing wounds Secondary Dressing: Gauze 1 x Per Week/30 Days Discharge Instructions: As directed: dry, moistened with saline or moistened with Dakins Solution Compression Wrap: Profore Lite LF 3 Multilayer Compression Bandaging System 1 x Per Week/30 Days Discharge Instructions: Apply 3 multi-layer wrap as prescribed. 1. Would recommend currently that we going to continue with the wound care measures as before and the patient is in agreement the plan. This includes the use of the NuShield which is the first application today. Subsequently we will have one ordered for next week. 2. I am also can recommend that we continue with the 3 layer compression wrap I did put a little bolster of gauze outside of the Mepitel in order to hold the skin substitute in place. We will see patient back for reevaluation in 1 week here in the clinic. If anything worsens or changes patient will contact our office for additional recommendations. Electronic Signature(s) Signed: 10/13/2021 5:04:14 PM By: Worthy Keeler PA-C Entered By: Worthy Keeler on 10/13/2021 17:04:14 [place], [name] ([id number]) -------------------------------------------------------------------------------- SuperBill Details Patient Name: [name] Date of Service: 10/13/2021 Medical Record Number: [id number] Patient Account Number: [id number] Date of Birth/Sex: December 27, 1922 (87 y.o. F) Treating RN: Carlene Coria Primary Care Provider: Adrian Prows Other Clinician: Referring Provider: Adrian Prows Treating Provider/Extender: Skipper Cliche in Treatment: 21 Diagnosis Coding ICD-10 Codes Code Description 6013791357 Pressure ulcer of right ankle, stage 3 I89.0 Lymphedema, not elsewhere classified I87.2  Venous insufficiency (chronic) (peripheral) Z95.0 Presence of cardiac pacemaker Z79.01 Long term (current) use of anticoagulants Facility Procedures CPT4 Code: [id number] Description: Q2297 NUSHIELD 1.6 SQ CM (CHG PER SQ CM) Modifier: Quantity: 1.6 CPT4 Code: 98921194 Description: 17408 - SKIN SUB GRAFT TRNK/ARM/LEG Modifier: Quantity: 1 CPT4 Code: Description: ICD-10 Diagnosis Description 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, stage 3 Modifier: Quantity: Physician Procedures CPT4 Code: 1448185 Description: 63149 - WC PHYS SKIN SUB GRAFT TRNK/ARM/LEG Modifier: Quantity: 1 CPT4 Code: Description: ICD-10 Diagnosis Description 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, stage 3 Modifier: Quantity: Electronic Signature(s) Signed: 10/13/2021 5:04:29 PM By: Worthy Keeler PA-C Entered By: Worthy Keeler on 10/13/2021 17:04:29

## 2021-10-14 NOTE — Progress Notes (Signed)
[name], [name] ([id number]) Visit Report for 10/13/2021 Arrival Information Details Patient Name: [name], [name] Date of Service: 10/13/2021 10:15 AM Medical Record Number: [id number] Patient Account Number: [id number] Date of Birth/Sex: [birth year] (87 y.o. F) Treating RN: [name] Primary Care [name]: [name] Other Clinician: Referring [name]: [name] Treating [name]/Extender: [name] in Treatment: 21 Visit Information History Since Last Visit All ordered tests and consults were completed: No Patient Arrived: [name] Added or deleted any medications: No Arrival Time: 10:14 Any new allergies or adverse reactions: No Accompanied By: self Had a fall or experienced change in No Transfer Assistance: None activities of daily living that may affect Patient Identification Verified: Yes risk of falls: Secondary Verification Process Completed: Yes Signs or symptoms of abuse/neglect since last visito No Patient Requires Transmission-Based No Hospitalized since last visit: No Precautions: Implantable device outside of the clinic excluding No Patient Has Alerts: Yes cellular tissue based products placed in the center Patient Alerts: Patient on Blood since last visit: Thinner Has Dressing in Place as Prescribed: Yes NOT DIABETIC Pain Present Now: No ***ELIQUIS*** Electronic Signature(s) Signed: 10/14/2021 3:56:47 PM By: [name] Coria RN Entered By: [name] on 10/13/2021 10:19:52 [name], [name] ([id number]) -------------------------------------------------------------------------------- Clinic Level of Care Assessment Details Patient Name: [name] Date of Service: 10/13/2021 10:15 AM Medical Record Number: [id number] Patient Account Number: [id number] Date of Birth/Sex: [birth year] (87 y.o. F) Treating RN: [name] Primary Care [name]: [name] Other Clinician: Referring [name]: [name] Treating Kwana Ringel/Extender: [name] in Treatment: 21 Clinic Level of Care Assessment Items TOOL 1 Quantity Score []  - Use when EandM and Procedure is performed on INITIAL visit 0 ASSESSMENTS - Nursing Assessment / Reassessment []  - General Physical Exam (combine w/ comprehensive assessment (listed just below) when performed on new 0 pt. evals) []  - 0 Comprehensive Assessment (HX, ROS, Risk Assessments, Wounds Hx, etc.) ASSESSMENTS - Wound and Skin Assessment / Reassessment []  - Dermatologic / Skin Assessment (not related to wound area) 0 ASSESSMENTS - Ostomy and/or Continence Assessment and Care []  - Incontinence Assessment and Management 0 []  - 0 Ostomy Care Assessment and Management (repouching, etc.) PROCESS - Coordination of Care []  - Simple Patient / Family Education for ongoing care 0 []  - 0 Complex (extensive) Patient / Family Education for ongoing care []  - 0 Staff obtains Programmer, systems, Records, Test Results / Process Orders []  - 0 Staff telephones HHA, Nursing Homes / Clarify orders / etc []  - 0 Routine Transfer to another Facility (non-emergent condition) []  - 0 Routine Hospital Admission (non-emergent condition) []  - 0 New Admissions / Biomedical engineer / Ordering NPWT, Apligraf, etc. []  - 0 Emergency Hospital Admission (emergent condition) PROCESS - Special Needs []  - Pediatric / Minor Patient Management 0 []  - 0 Isolation Patient Management []  - 0 Hearing / Language / Visual special needs []  - 0 Assessment of Community assistance (transportation, D/C planning, etc.) []  - 0 Additional assistance / Altered mentation []  - 0 Support Surface(s) Assessment (bed, cushion, seat, etc.) INTERVENTIONS - Miscellaneous []  - External ear exam 0 []  - 0 Patient Transfer (multiple staff / Civil Service fast streamer / Similar devices) []  - 0 Simple Staple / Suture removal (25 or less) []  - 0 Complex Staple / Suture removal (26 or more) []  - 0 Hypo/Hyperglycemic  Management (do not check if billed separately) []  - 0 Ankle / Brachial Index (ABI) - do not check if billed separately Has the patient been seen at the hospital within  the last three years: Yes Total Score: 0 Level Of Care: ____ [name] ([id number]) Electronic Signature(s) Signed: 10/14/2021 3:56:47 PM By: [name] Coria RN Entered By: [name] on 10/13/2021 10:53:39 [name], [name] ([id number]) -------------------------------------------------------------------------------- Lower Extremity Assessment Details Patient Name: [name] Date of Service: 10/13/2021 10:15 AM Medical Record Number: [id number] Patient Account Number: [id number] Date of Birth/Sex: 04/18/1923 (87 y.o. F) Treating RN: [name] Primary Care [name]: [name] Other Clinician: Referring [name]: [name] Treating [name]/Extender: [name] in Treatment: 21 Edema Assessment Assessed: [Left: No] [Right: No] Edema: [Left: Ye] [Right: s] Calf Left: Right: Point of Measurement: 33 cm From Medial Instep 28 cm Ankle Left: Right: Point of Measurement: 10 cm From Medial Instep 17 cm Vascular Assessment Pulses: Dorsalis Pedis Palpable: [Right:Yes] Electronic Signature(s) Signed: 10/14/2021 3:56:47 PM By: [name] Coria RN Entered By: [name] on 10/13/2021 10:33:40 [name], [name] ([id number]) -------------------------------------------------------------------------------- Multi Wound Chart Details Patient Name: [name] Date of Service: 10/13/2021 10:15 AM Medical Record Number: [id number] Patient Account Number: [id number] Date of Birth/Sex: February 17, 1923 (87 y.o. F) Treating RN: [name] Primary Care Vere Diantonio: [name] Other Clinician: Referring [name]: [name] Treating [name]/Extender: [name] in Treatment: 21 Vital Signs Height(in): 62 Pulse(bpm): 30 Weight(lbs): 150 Blood Pressure(mmHg):  145/80 Body Mass Index(BMI): 27 Temperature(F): 98.1 Respiratory Rate(breaths/min): 18 Photos: [N/A:N/A] Wound Location: Right, Lateral Malleolus N/A N/A Wounding Event: Gradually Appeared N/A N/A Primary Etiology: Pressure Ulcer N/A N/A Comorbid History: Cataracts, Congestive Heart Failure, N/A N/A Hypertension, Peripheral Arterial Disease, Osteoarthritis, Neuropathy Date Acquired: 05/12/2021 N/A N/A Weeks of Treatment: 21 N/A N/A Wound Status: Open N/A N/A Measurements L x W x D (cm) 1x1x0.3 N/A N/A Area (cm) : 0.785 N/A N/A Volume (cm) : 0.236 N/A N/A % Reduction in Area: 30.10% N/A N/A % Reduction in Volume: -4.90% N/A N/A Classification: Category/Stage III N/A N/A Exudate Amount: Medium N/A N/A Exudate Type: Serosanguineous N/A N/A Exudate Color: red, brown N/A N/A Wound Margin: Distinct, outline attached N/A N/A Granulation Amount: Medium (34-66%) N/A N/A Granulation Quality: Red, Pink, Pale N/A N/A Necrotic Amount: Medium (34-66%) N/A N/A Exposed Structures: Fat Layer (Subcutaneous Tissue): N/A N/A Yes Fascia: No Tendon: No Muscle: No Joint: No Bone: No Epithelialization: Small (1-33%) N/A N/A Treatment Notes Electronic Signature(s) Signed: 10/14/2021 3:56:47 PM By: [name] Coria RN Entered By: [name] on 10/13/2021 10:44:38 [name], [name] ([id number]) -------------------------------------------------------------------------------- Multi-Disciplinary Care Plan Details Patient Name: [name] Date of Service: 10/13/2021 10:15 AM Medical Record Number: [id number] Patient Account Number: [id number] Date of Birth/Sex: May 08, 1923 (87 y.o. F) Treating RN: [name] Primary Care Katrine Radich: [name] Other Clinician: Referring Mariama Saintvil: [name] Treating Chantea Surace/Extender: [name] in Treatment: 21 Active Inactive Electronic Signature(s) Signed: 10/14/2021 3:56:47 PM By: [name] Coria RN Entered By: [name] on  10/13/2021 10:44:11 [name], [name] ([id number]) -------------------------------------------------------------------------------- Pain Assessment Details Patient Name: [name] Date of Service: 10/13/2021 10:15 AM Medical Record Number: [id number] Patient Account Number: [id number] Date of Birth/Sex: [birth year] (87 y.o. F) Treating RN: [name] Primary Care Mi Balla: [name] Other Clinician: Referring Vasil Juhasz: [name] Treating Nadene Witherspoon/Extender: [name] in Treatment: 21 Active Problems Location of Pain Severity and Description of Pain Patient Has Paino No Site Locations Pain Management and Medication Current Pain Management: Electronic Signature(s) Signed: 10/14/2021 3:56:47 PM By: [name] Coria RN Entered By: [name] on 10/13/2021 10:20:41 [name], [name] ([id number]) -------------------------------------------------------------------------------- Wound Assessment Details Patient Name: [name] Date of Service:  10/13/2021 10:15 AM Medical Record Number: [id number] Patient Account Number: [id number] Date of Birth/Sex: [birth year] (87 y.o. F) Treating RN: [name] Primary Care Jasslyn Finkel: [name] Other Clinician: Referring Faylinn Schwenn: [name] Treating Denzil Mceachron/Extender: [name] in Treatment: 21 Wound Status Wound Number: 7 Primary Pressure Ulcer Etiology: Wound Location: Right, Lateral Malleolus Wound Open Wounding Event: Gradually Appeared Status: Date Acquired: 05/12/2021 Comorbid Cataracts, Congestive Heart Failure, Hypertension, Weeks Of Treatment: 21 History: Peripheral Arterial Disease, Osteoarthritis, Neuropathy Clustered Wound: No Photos Wound Measurements Length: (cm) 1 Width: (cm) 1 Depth: (cm) 0.3 Area: (cm) 0.785 Volume: (cm) 0.236 % Reduction in Area: 30.1% % Reduction in Volume: -4.9% Epithelialization: Small (1-33%) Tunneling: No Undermining: No Wound  Description Classification: Category/Stage III Wound Margin: Distinct, outline attached Exudate Amount: Medium Exudate Type: Serosanguineous Exudate Color: red, brown Foul Odor After Cleansing: No Slough/Fibrino Yes Wound Bed Granulation Amount: Medium (34-66%) Exposed Structure Granulation Quality: Red, Pink, Pale Fascia Exposed: No Necrotic Amount: Medium (34-66%) Fat Layer (Subcutaneous Tissue) Exposed: Yes Tendon Exposed: No Muscle Exposed: No Joint Exposed: No Bone Exposed: No Electronic Signature(s) Signed: 10/14/2021 3:56:47 PM By: [name] Coria RN Entered By: [name] on 10/13/2021 10:32:44 Micco, [name] ([id number]) -------------------------------------------------------------------------------- Vitals Details Patient Name: [name] Date of Service: 10/13/2021 10:15 AM Medical Record Number: [id number] Patient Account Number: [id number] Date of Birth/Sex: 1922-12-28 (87 y.o. F) Treating RN: [name] Primary Care Lyell Clugston: [name] Other Clinician: Referring Cornelis Kluver: [name] Treating Maijor Hornig/Extender: [name] in Treatment: 21 Vital Signs Time Taken: 10:20 Temperature (F): 98.1 Height (in): 62 Pulse (bpm): 74 Weight (lbs): 150 Respiratory Rate (breaths/min): 18 Body Mass Index (BMI): 27.4 Blood Pressure (mmHg): 145/80 Reference Range: 80 - 120 mg / dl Electronic Signature(s) Signed: 10/14/2021 3:56:47 PM By: [name] Coria RN Entered By: [name] on 10/13/2021 10:20:20

## 2021-10-20 ENCOUNTER — Other Ambulatory Visit: Payer: Self-pay

## 2021-10-20 ENCOUNTER — Encounter: Payer: Medicare Other | Admitting: Physician Assistant

## 2021-10-20 DIAGNOSIS — L89513 Pressure ulcer of right ankle, stage 3: Secondary | ICD-10-CM | POA: Diagnosis not present

## 2021-10-20 NOTE — Progress Notes (Addendum)
[name], [name] ([id number]) Visit Report for 10/20/2021 Arrival Information Details Patient Name: [name], [name] Date of Service: 10/20/2021 10:15 AM Medical Record Number: [id number] Patient Account Number: [id number] Date of Birth/Sex: 01-09-1923 (87 y.o. F) Treating RN: Carlene Coria Primary Care Girl Schissler: Adrian Prows Other Clinician: Referring Phylicia Mcgaugh: Adrian Prows Treating Kashish Yglesias/Extender: Skipper Cliche in Treatment: 20 Visit Information History Since Last Visit All ordered tests and consults were completed: No Patient Arrived: [name] Added or deleted any medications: No Arrival Time: 10:16 Any new allergies or adverse reactions: No Accompanied By: son Had a fall or experienced change in No Transfer Assistance: None activities of daily living that may affect Patient Identification Verified: Yes risk of falls: Secondary Verification Process Completed: Yes Signs or symptoms of abuse/neglect since last visito No Patient Requires Transmission-Based No Hospitalized since last visit: No Precautions: Implantable device outside of the clinic excluding No Patient Has Alerts: Yes cellular tissue based products placed in the center Patient Alerts: Patient on Blood since last visit: Thinner Has Dressing in Place as Prescribed: Yes NOT DIABETIC Has Compression in Place as Prescribed: Yes ***ELIQUIS*** Pain Present Now: No Electronic Signature(s) Signed: 10/21/2021 10:13:49 AM By: Carlene Coria RN Entered By: Carlene Coria on 10/20/2021 10:32:11 Dains, [name] ([id number]) -------------------------------------------------------------------------------- Clinic Level of Care Assessment Details Patient Name: [name] Date of Service: 10/20/2021 10:15 AM Medical Record Number: [id number] Patient Account Number: [id number] Date of Birth/Sex: 1923-01-30 (87 y.o. F) Treating RN: Carlene Coria Primary Care Vickye Astorino: Adrian Prows Other  Clinician: Referring Jazmene Racz: Adrian Prows Treating Apphia Cropley/Extender: Skipper Cliche in Treatment: 22 Clinic Level of Care Assessment Items TOOL 1 Quantity Score []  - Use when EandM and Procedure is performed on INITIAL visit 0 ASSESSMENTS - Nursing Assessment / Reassessment []  - General Physical Exam (combine w/ comprehensive assessment (listed just below) when performed on new 0 pt. evals) []  - 0 Comprehensive Assessment (HX, ROS, Risk Assessments, Wounds Hx, etc.) ASSESSMENTS - Wound and Skin Assessment / Reassessment []  - Dermatologic / Skin Assessment (not related to wound area) 0 ASSESSMENTS - Ostomy and/or Continence Assessment and Care []  - Incontinence Assessment and Management 0 []  - 0 Ostomy Care Assessment and Management (repouching, etc.) PROCESS - Coordination of Care []  - Simple Patient / Family Education for ongoing care 0 []  - 0 Complex (extensive) Patient / Family Education for ongoing care []  - 0 Staff obtains Programmer, systems, Records, Test Results / Process Orders []  - 0 Staff telephones HHA, Nursing Homes / Clarify orders / etc []  - 0 Routine Transfer to another Facility (non-emergent condition) []  - 0 Routine Hospital Admission (non-emergent condition) []  - 0 New Admissions / Biomedical engineer / Ordering NPWT, Apligraf, etc. []  - 0 Emergency Hospital Admission (emergent condition) PROCESS - Special Needs []  - Pediatric / Minor Patient Management 0 []  - 0 Isolation Patient Management []  - 0 Hearing / Language / Visual special needs []  - 0 Assessment of Community assistance (transportation, D/C planning, etc.) []  - 0 Additional assistance / Altered mentation []  - 0 Support Surface(s) Assessment (bed, cushion, seat, etc.) INTERVENTIONS - Miscellaneous []  - External ear exam 0 []  - 0 Patient Transfer (multiple staff / Civil Service fast streamer / Similar devices) []  - 0 Simple Staple / Suture removal (25 or less) []  - 0 Complex Staple / Suture removal  (26 or more) []  - 0 Hypo/Hyperglycemic Management (do not check if billed separately) []  - 0 Ankle / Brachial Index (ABI) - do not check if billed separately Has the  patient been seen at the hospital within the last three years: Yes Total Score: 0 Level Of Care: ____ [name] ([id number]) Electronic Signature(s) Signed: 10/21/2021 10:13:49 AM By: Carlene Coria RN Entered By: Carlene Coria on 10/20/2021 11:04:34 Thieme, [name] ([id number]) -------------------------------------------------------------------------------- Encounter Discharge Information Details Patient Name: [name] Date of Service: 10/20/2021 10:15 AM Medical Record Number: [id number] Patient Account Number: [id number] Date of Birth/Sex: [birth year] (87 y.o. F) Treating RN: Carlene Coria Primary Care Shaunessy Dobratz: Adrian Prows Other Clinician: Referring Britzy Graul: Adrian Prows Treating Miroslav Gin/Extender: Skipper Cliche in Treatment: 22 Encounter Discharge Information Items Post Procedure Vitals Discharge Condition: Stable Temperature (F): 98.3 Ambulatory Status: Walker Pulse (bpm): 72 Discharge Destination: Home Respiratory Rate (breaths/min): 18 Transportation: Private Auto Blood Pressure (mmHg): 139/82 Accompanied By: son Schedule Follow-up Appointment: Yes Clinical Summary of Care: Patient Declined Electronic Signature(s) Signed: 10/20/2021 11:14:14 AM By: Carlene Coria RN Entered By: Carlene Coria on 10/20/2021 11:14:14 Farruggia, [name] ([id number]) -------------------------------------------------------------------------------- Lower Extremity Assessment Details Patient Name: [name] Date of Service: 10/20/2021 10:15 AM Medical Record Number: [id number] Patient Account Number: [id number] Date of Birth/Sex: Oct 21, 1923 (87 y.o. F) Treating RN: Carlene Coria Primary Care Yancey Pedley: Adrian Prows Other Clinician: Referring Raiza [name]: Adrian Prows Treating  Shaheem Pichon/Extender: Skipper Cliche in Treatment: 22 Edema Assessment Assessed: [Left: No] [Right: No] Edema: [Left: Ye] [Right: s] Calf Left: Right: Point of Measurement: 33 cm From Medial Instep 27 cm Ankle Left: Right: Point of Measurement: 10 cm From Medial Instep 17 cm Vascular Assessment Pulses: Dorsalis Pedis Palpable: [Right:Yes] Electronic Signature(s) Signed: 10/21/2021 10:13:49 AM By: Carlene Coria RN Entered By: Carlene Coria on 10/20/2021 10:35:20 Frenz, [name] ([id number]) -------------------------------------------------------------------------------- Multi Wound Chart Details Patient Name: [name] Date of Service: 10/20/2021 10:15 AM Medical Record Number: [id number] Patient Account Number: [id number] Date of Birth/Sex: May 09, 1923 (87 y.o. F) Treating RN: Carlene Coria Primary Care Ashlon Lottman: Adrian Prows Other Clinician: Referring Marry Kusch: Adrian Prows Treating Berel Najjar/Extender: Skipper Cliche in Treatment: 22 Vital Signs Height(in): 62 Pulse(bpm): 72 Weight(lbs): 150 Blood Pressure(mmHg): 139/82 Body Mass Index(BMI): 27 Temperature(F): 98.3 Respiratory Rate(breaths/min): 18 Photos: [N/A:N/A] Wound Location: Right, Lateral Malleolus N/A N/A Wounding Event: Gradually Appeared N/A N/A Primary Etiology: Pressure Ulcer N/A N/A Comorbid History: Cataracts, Congestive Heart Failure, N/A N/A Hypertension, Peripheral Arterial Disease, Osteoarthritis, Neuropathy Date Acquired: 05/12/2021 N/A N/A Weeks of Treatment: 22 N/A N/A Wound Status: Open N/A N/A Measurements L x W x D (cm) 1x1.1x0.2 N/A N/A Area (cm) : 0.864 N/A N/A Volume (cm) : 0.173 N/A N/A % Reduction in Area: 23.10% N/A N/A % Reduction in Volume: 23.10% N/A N/A Classification: Category/Stage III N/A N/A Exudate Amount: Medium N/A N/A Exudate Type: Serosanguineous N/A N/A Exudate Color: red, brown N/A N/A Wound Margin: Distinct, outline attached N/A  N/A Granulation Amount: Medium (34-66%) N/A N/A Granulation Quality: Red, Pink, Pale N/A N/A Necrotic Amount: Medium (34-66%) N/A N/A Exposed Structures: Fat Layer (Subcutaneous Tissue): N/A N/A Yes Fascia: No Tendon: No Muscle: No Joint: No Bone: No Epithelialization: Small (1-33%) N/A N/A Treatment Notes Electronic Signature(s) Signed: 10/21/2021 10:13:49 AM By: Carlene Coria RN Entered By: Carlene Coria on 10/20/2021 10:39:58 Mandella, [name] ([id number]) -------------------------------------------------------------------------------- Multi-Disciplinary Care Plan Details Patient Name: [name] Date of Service: 10/20/2021 10:15 AM Medical Record Number: [id number] Patient Account Number: [id number] Date of Birth/Sex: 1923/05/15 (87 y.o. F) Treating RN: Carlene Coria Primary Care Adolpho Meenach: Adrian Prows Other Clinician: Referring Zari Cly: Adrian Prows Treating Clarita Mcelvain/Extender: Skipper Cliche in Treatment: 22 Active Inactive Electronic Signature(s)  Signed: 10/21/2021 10:13:49 AM By: Carlene Coria RN Entered By: Carlene Coria on 10/20/2021 10:38:42 [name] ([id number]) -------------------------------------------------------------------------------- Pain Assessment Details Patient Name: [name] Date of Service: 10/20/2021 10:15 AM Medical Record Number: [id number] Patient Account Number: [id number] Date of Birth/Sex: January 29, 1923 (87 y.o. F) Treating RN: Carlene Coria Primary Care Issak Goley: Adrian Prows Other Clinician: Referring Keoni Risinger: Adrian Prows Treating Jacob Cicero/Extender: Skipper Cliche in Treatment: 22 Active Problems Location of Pain Severity and Description of Pain Patient Has Paino No Site Locations Pain Management and Medication Current Pain Management: Electronic Signature(s) Signed: 10/21/2021 10:13:49 AM By: Carlene Coria RN Entered By: Carlene Coria on 10/20/2021 10:32:46 Briggs, [name]  ([id number]) -------------------------------------------------------------------------------- Patient/Caregiver Education Details Patient Name: [name] Date of Service: 10/20/2021 10:15 AM Medical Record Number: [id number] Patient Account Number: [id number] Date of Birth/Gender: [birth year] (87 y.o. F) Treating RN: Carlene Coria Primary Care Physician: Adrian Prows Other Clinician: Referring Physician: Adrian Prows Treating Physician/Extender: Skipper Cliche in Treatment: 22 Education Assessment Education Provided To: Patient Education Topics Provided Wound/Skin Impairment: Methods: Explain/Verbal Responses: State content correctly Electronic Signature(s) Signed: 10/21/2021 10:13:49 AM By: Carlene Coria RN Entered By: Carlene Coria on 10/20/2021 11:05:26 Lopes, [name] ([id number]) -------------------------------------------------------------------------------- Wound Assessment Details Patient Name: [name] Date of Service: 10/20/2021 10:15 AM Medical Record Number: [id number] Patient Account Number: [id number] Date of Birth/Sex: [birth year] (87 y.o. F) Treating RN: Carlene Coria Primary Care Ismeal Heider: Adrian Prows Other Clinician: Referring Sadik Piascik: Adrian Prows Treating Tomasita Beevers/Extender: Skipper Cliche in Treatment: 22 Wound Status Wound Number: 7 Primary Pressure Ulcer Etiology: Wound Location: Right, Lateral Malleolus Wound Open Wounding Event: Gradually Appeared Status: Date Acquired: 05/12/2021 Comorbid Cataracts, Congestive Heart Failure, Hypertension, Weeks Of Treatment: 22 History: Peripheral Arterial Disease, Osteoarthritis, Neuropathy Clustered Wound: No Photos Wound Measurements Length: (cm) 1 Width: (cm) 1.1 Depth: (cm) 0.2 Area: (cm) 0.864 Volume: (cm) 0.173 % Reduction in Area: 23.1% % Reduction in Volume: 23.1% Epithelialization: Small (1-33%) Tunneling: No Undermining: No Wound  Description Classification: Category/Stage III Wound Margin: Distinct, outline attached Exudate Amount: Medium Exudate Type: Serosanguineous Exudate Color: red, brown Foul Odor After Cleansing: No Slough/Fibrino Yes Wound Bed Granulation Amount: Medium (34-66%) Exposed Structure Granulation Quality: Red, Pink, Pale Fascia Exposed: No Necrotic Amount: Medium (34-66%) Fat Layer (Subcutaneous Tissue) Exposed: Yes Tendon Exposed: No Muscle Exposed: No Joint Exposed: No Bone Exposed: No Treatment Notes Wound #7 (Malleolus) Wound Laterality: Right, Lateral Cleanser Soap and Water Discharge Instruction: Gently cleanse wound with antibacterial soap, rinse and pat dry prior to dressing wounds Peri-Wound Care Ehinger, [name] ([id number]) Topical Primary Dressing Secondary Dressing Gauze Discharge Instruction: As directed: dry, moistened with saline or moistened with Dakins Solution Secured With Compression Wrap Profore Lite LF 3 Multilayer Compression Bandaging System Discharge Instruction: Apply 3 multi-layer wrap as prescribed. Compression Stockings Add-Ons Electronic Signature(s) Signed: 10/21/2021 10:13:49 AM By: Carlene Coria RN Entered By: Carlene Coria on 10/20/2021 10:33:45 Pottle, [name] ([id number]) -------------------------------------------------------------------------------- Vitals Details Patient Name: [name] Date of Service: 10/20/2021 10:15 AM Medical Record Number: [id number] Patient Account Number: [id number] Date of Birth/Sex: Apr 29, 1923 (87 y.o. F) Treating RN: Carlene Coria Primary Care Hina Gupta: Adrian Prows Other Clinician: Referring Clorene Nerio: Adrian Prows Treating Drayson Dorko/Extender: Skipper Cliche in Treatment: 22 Vital Signs Time Taken: 10:32 Temperature (F): 98.3 Height (in): 62 Pulse (bpm): 72 Weight (lbs): 150 Respiratory Rate (breaths/min): 18 Body Mass Index (BMI): 27.4 Blood Pressure (mmHg):  139/82 Reference Range: 80 - 120 mg / dl Electronic Signature(s) Signed: 10/21/2021 10:13:49 AM  By: Carlene Coria RN Entered By: Carlene Coria on 10/20/2021 10:32:37

## 2021-10-20 NOTE — Progress Notes (Addendum)
DWAYNA, KENTNER ([id number]) Visit Report for 10/20/2021 Chief Complaint Document Details Patient Name: [name], [name]. Date of Service: 10/20/2021 10:15 AM Medical Record Number: [id number] Patient Account Number: [id number] Date of Birth/Sex: May 13, 1923 (87 y.o. F) Treating RN: Carlene Coria Primary Care Provider: Adrian Prows Other Clinician: Referring Provider: Adrian Prows Treating Provider/Extender: Skipper Cliche in Treatment: 22 Information Obtained from: Patient Chief Complaint Right foot ulcer Electronic Signature(s) Signed: 10/20/2021 10:15:25 AM By: Worthy Keeler PA-C Entered By: Worthy Keeler on 10/20/2021 10:15:25 Twedt, [name] ([id number]) -------------------------------------------------------------------------------- Cellular or Tissue Based Product Details Patient Name: [name], [name] Date of Service: 10/20/2021 10:15 AM Medical Record Number: [id number] Patient Account Number: [id number] Date of Birth/Sex: October 15, 1923 (87 y.o. F) Treating RN: Carlene Coria Primary Care Provider: Adrian Prows Other Clinician: Referring Provider: Adrian Prows Treating Provider/Extender: Skipper Cliche in Treatment: 22 Cellular or Tissue Based Product Type Wound #7 Right,Lateral Malleolus Applied to: Performed By: Physician Tommie Sams., PA-C Cellular or Tissue Based Product [place] Type: Level of Consciousness (Pre- Awake and Alert procedure): Pre-procedure Verification/Time Out Yes - 10:40 Taken: Location: trunk / arms / legs Wound Size (sq cm): 1.1 Product Size (sq cm): 1.6 Waste Size (sq cm): 0 Amount of Product Applied (sq cm): 1.6 Instrument Used: Forceps, Scissors Lot #: [id number] Order #: 2 Expiration Date: 06/07/2026 Fenestrated: No Reconstituted: Yes Solution Type: normal saline Solution Amount: 10 ml Lot #: [id number] Expiration Date: 05/05/2023 Secured: Yes Secured With: Steri-Strips Dressing Applied:  Yes Primary Dressing: adaptic Procedural Pain: 0 Post Procedural Pain: 0 Response to Treatment: Procedure was tolerated well Level of Consciousness (Post- Awake and Alert procedure): Post Procedure Diagnosis Same as Pre-procedure Electronic Signature(s) Signed: 10/21/2021 11:10:40 AM By: Carlene Coria RN Previous Signature: 10/21/2021 10:13:49 AM Version By: Carlene Coria RN Entered By: Carlene Coria on 10/21/2021 11:10:40 Parkinson, [name] ([id number]) -------------------------------------------------------------------------------- HPI Details Patient Name: [name] Date of Service: 10/20/2021 10:15 AM Medical Record Number: [id number] Patient Account Number: [id number] Date of Birth/Sex: 04/17/1923 (87 y.o. 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 Dr. Amalia Hailey the podiatrist saw her on 07/20/2017 and sent her to the wound center for possible hyperbaric oxygen therapy. past medical history of peripheral vascular disease, varicose veins, status post appendectomy, basal cell carcinoma excision from the left leg, cholecystectomy, pacemaker placement, right lower extremity angiography done by Dr. dew in March 2017 with placement of a stent. there is also note of a successful ablation of the right small saphenous vein done which was reviewed by ultrasound on 10/24/2016. the patient had a right small saphenous vein ablation done on 10/20/2016. The patient has never been a smoker. She has been seen by Dr. Corene Cornea dew the vascular surgeon who most recently saw her on 06/15/2017 for evaluation of ongoing  problems with right leg swelling. She had a lower extremity arterial duplex examination done(02/13/17) which showed patent distal right superficial femoral artery stent and above-the-knee popliteal stent without evidence of restenosis. The ABI was more than 1.3 on the right and more than 1.3 on the left. This was consistent with noncompressible arteries due to medial calcification. The right great toe pressure and PPG waveforms are within normal limits  and the left great toe pressure and PPG waveforms are decreased. he recommended she continue to wear her compression stockings and continue with elevation. She is scheduled to have a noninvasive arterial study in the near future 08/16/2017 -- had a lower extremity arterial duplex examination done which showed patent distal right superficial femoral artery stent and above-the-knee popliteal stent without evidence of restenosis. The ABI was more than 1.3 on the right and more than 1.3 on the left. This was consistent with noncompressible arteries due to medial calcification. The right great toe pressure and PPG waveforms are within normal limits and the left great toe pressure and PPG waveforms are decreased. the x-ray of the right ankle has not yet been done 08/24/2017 -- had a right ankle x-ray -- IMPRESSION:1. No fracture, bone lesion or evidence of osteomyelitis. 2. Lateral soft tissue swelling with a soft tissue ulcer. she has not yet seen the vascular surgeon for review 08/31/17 on evaluation today patient's wound appears to be showing signs of improvement. She still with her appointment with vascular in order to review her results of her vascular study and then determine if any intervention would be recommended at that time. No fevers, chills, nausea, or vomiting noted at this time. She has been tolerating the dressing changes without complication. 09/28/17 on evaluation today patient's wound appears to show signs of good improvement in regard to  the granulation tissue which is surfacing. There is still a layer of slough covering the wound and the posterior portion is still significantly deeper than the anterior nonetheless there has been some good sign of things moving towards the better. She is going to go back to Dr. dew for reevaluation to ensure her blood flow is still appropriate. That will be before her next evaluation with [place] next week. No fevers, chills, nausea, or vomiting noted at this time. Patient does have some discomfort rated to be a 3-4/10 depending on activity specifically cleansing the wound makes it worse. 10/05/2017 -- the patient was seen by Dr. Lucky Cowboy last week and noninvasive studies showed a normal right ABI with brisk triphasic waveforms consistent with no arterial insufficiency including normal digital pressures. The duplex showed a patent distal right SFA stent and the proximal SFA was also normal. He was pleased with her test and thought she should have enough of perfusion for normal wound healing. He would see her back in 6 months time. 12/21/17 on evaluation today patient appears to be doing fairly well in regard to her right lateral ankle wound. Unfortunately the main issue that she is expansion at this point is that she is having some issues with what appears to be some cellulitis in the right anterior shin. She has also been noting a little bit of uncomfortable feeling especially last night and her ankle area. I'm afraid that she made the developing a little bit of an infection. With that being said I think it is in the early stages. 12/28/17 on evaluation today patient's ankle appears to be doing excellent. She's making good progress at this point the cellulitis seems to have improved after last week's evaluation. Overall she is having no significant discomfort which is excellent news. She does have an appointment with Dr. dew on March 29, 2018 for reevaluation in regard to the stent he placed. She seems to have  excellent blood flow in the right lower extremity. 01/19/12 on evaluation today patient's wound appears to be doing very well. In fact she does not appear to require debridement  at this point, there's no evidence of infection, and overall from the standpoint of the wound she seems to be doing very well. With that being said I believe that it may be time to switch to different dressing away from the [facility] Dressing she tells me she does have a lot going on her friend actually passed away yesterday and she's also having a lot of issues with her husband this obviously is weighing heavy on her as far as your thoughts and concerns today. 01/25/18 on evaluation today patient appears to be doing fairly well in regard to her right lateral malleolus. She has been tolerating the dressing changes without complication. Overall I feel like this is definitely showing signs of improvement as far as how the overall appearance of the wound is there's also evidence of epithelium start to migrate over the granulation tissue. In general I think that she is progressing nicely as far as the wound is concerned. The only concern she really has is whether or not we can switch to every other week visits in order to avoid having as many appointments as her daughters have a difficult time getting her to her appointments as well as the patient's husband to his he is not doing very well at this point. 02/22/18 on evaluation today patient's right lateral malleolus ulcer appears to be doing great. She has been tolerating the dressing changes without complication. Overall you making excellent progress at this time. Patient is having no significant discomfort. [name], [name] ([id number]) 03/15/18 on evaluation today patient appears to be doing much more poorly in regard to her right lateral ankle ulcer at this point. Unfortunately since have last seen her her husband has passed just a few days ago is obviously weighed heavily  on her her daughter also had surgery well she is with her today as usual. There does not appear to be any evidence of infection she does seem to have significant contusion/deep tissue injury to the right lateral malleolus which was not noted previous when I saw her last. It's hard to tell of exactly when this injury occurred although during the time she was spending the night in the hospital this may have been most likely. 03/22/18 on evaluation today patient appears to actually be doing very well in regard to her ulcer. She did unfortunately have a setback which was noted last week however the good news is we seem to be getting back on track and in fact the wound in the core did still have some necrotic tissue which will be addressed at this point today but in general I'm seeing signs that things are on the up and up. She is glad to hear this obviously she's been somewhat concerned that due to the how her wound digressed more recently. 03/29/18 on evaluation today patient appears to be doing fairly well in regard to her right lower extremity lateral malleolus ulcer. She unfortunately does have a new area of pressure injury over the inferior portion where the wound has opened up a little bit larger secondary to the pressure she seems to be getting. She does tell me sometimes when she sleeps at night that it actually hurts and does seem to be pushing on the area little bit more unfortunately. There does not appear to be any evidence of infection which is good news. She has been tolerating the dressing changes without complication. She also did have some bruising in the left second and third toes due to the fact that  she may have bump this or injured it although she has neuropathy so she does not feel she did move recently that may have been where this came from. Nonetheless there does not appear to be any evidence of infection at this time. 04/12/18 on evaluation today patient's wound on the right lateral  ankle actually appears to be doing a little bit better with a lot of necrotic docking tissue centrally loosening up in clearing away. However she does have the beginnings of a deep tissue injury on the left lateral malleolus likely due to the fact we've been trying offload the right as much as we have. I think she may benefit from an assistive soft device to help with offloading and it looks like they're looking at one of the doughnut conditions that wraps around the lower leg to offload which I think will definitely do a good job. With that being said I think we definitely need to address this issue on the left before it becomes a wound. Patient is not having significant pain. 04/19/18 on evaluation today patient appears to be doing excellent in regard to the progress she's made with her right lateral ankle ulcer. The left ankle region which did show evidence of a deep tissue injury seems to be resolving there's little fluid noted underneath and a blister there's nothing open at this point in time overall I feel like this is progressing nicely which is good news. She does not seem to be having significant discomfort at this point which is also good news. 04/25/18-She is here in follow up evaluation for bilateral lateral malleolar ulcers. The right lateral malleolus ulcer with pale subcutaneous tissue exposure, central area of ulcer with tendon/periosteum exposed. The left lateral malleolus ulcer now with central area of nonviable tissue, otherwise deep tissue injury. She is wearing compression wraps to the left lower extremity, she will place the right lower extremity compression wraps on when she gets home. She will be out of town over the weekend and return next week and follow-up appointment. She completed her doxycycline this morning 05/03/18 on evaluation today patient appears to be doing very well in regard to her right lateral ankle ulcer in general. At least she's showing some signs of  improvement in this regard. Unfortunately she has some additional injury to the left lateral malleolus region which appears to be new likely even over the past several days. Again this determination is based on the overall appearance. With that being said the patient is obviously frustrated about this currently. 05/10/18-She is here in follow-up evaluation for bilateral lateral malleolar ulcers. She states she has purchased offloading shoes/boots and they will arrive tomorrow. She was asked to bring them in the office at next week's appointment so her provider is aware of product being utilized. She continues to sleep on right or left side, she has been encouraged to sleep on her back. The right lateral malleolus ulcer is precariously close to peri-osteum; will order xray. The left lateral malleolus ulcer is improved. Will switch back to santyl; she will follow up next week. 05/17/18 on evaluation today patient actually appears to be doing very well in regard to her malleolus her ulcers compared to last time I saw them. She does not seem to have as much in the way of contusion at this point which is great news. With that being said she does continue to have discomfort and I do believe that she is still continuing to benefit from the offloading/pressure reducing boots that  were recommended. I think this is the key to trying to get this to heal up completely. 05/24/18 on evaluation today patient actually appears to be doing worse at this point in time unfortunately compared to her last week's evaluation. She is having really no increased pain which is good news unfortunately she does have more maceration in your theme and noted surrounding the right lateral ankle the left lateral ankle is not really is erythematous I do not see signs of the overt cellulitis on that side. Unfortunately the wounds do not seem to have shown any signs of improvement since the last evaluation. She also has significant swelling  especially on the right compared to previous some of this may be due to infection however also think that she may be served better while she has these wounds by compression wrapping versus continuing to use the Juxta-Lite for the time being. Especially with the amount of drainage that she is experiencing at this point. No fevers, chills, nausea, or vomiting noted at this time. 05/31/18 on evaluation today patient appears to actually be doing better in regard to her right lateral lower extremity ulcer specifically on the malleolus region. She has been tolerating the antibiotic without complication. With that being said she still continues to have issues but a little bit of redness although nothing like she what she was experiencing previous. She still continues to pressure to her ankle area she did get the problem on offloading boots unfortunately she will not wear them she states there too uncomfortable and she can't get in and out of the bed. Nonetheless at this point her wounds seem to be continually getting worse which is not what we want I'm getting somewhat concerned about her progress and how things are going to proceed if we do not intervene in some way shape or form. I therefore had a very lengthy conversation today about offloading yet again and even made a specific suggestion for switching her to a memory foam mattress and even gave the information for a specific one that they could look at getting if it was something that they were interested in considering. She does not want to be considered for a hospital bed air mattress although honestly insurance would not cover it that she does not have any wounds on her trunk. 06/14/18 on evaluation today both wounds over the bilateral lateral malleolus her ulcers appear to be doing better there's no evidence of pressure injury at this point. She did get the foam mattress for her bed and this does seem to have been extremely beneficial for her in my  pinion. Her daughter states that she is having difficulty getting out of bed because of how soft it is. The patient also relates this to be. Nonetheless I do feel like she's actually doing better. Unfortunately right after and around the time she was getting the mattress she also sustained a fall when she got up to go pick up the phone and ended up injuring her right elbow she has 18 sutures in place. We are not caring for this currently although home health is going to be taking the sutures out shortly. Nonetheless this may be something that we need to evaluate going forward. It depends on how well it has or has not healed in the end. She also recently saw an orthopedic specialist for an injection in the right shoulder just before her fall unfortunately the fall seems to have worsened her pain. 06/21/18 on evaluation today patient appears to  be doing about the same in regard to her lateral malleolus ulcers. Both appear to be just a little bit deeper but again we are clinging away the necrotic and dead tissue which I think is why this is progressing towards a deeper realm as opposed [name], [name]. ([id number]) to improving from my measurement standpoint in that regard. Nonetheless she has been tolerating the dressing changes she absolutely hates the memory foam mattress topper that was obtained for her nonetheless I do believe this is still doing excellent as far as taking care of excess pressure in regard to the lateral malleolus regions. She in fact has no pressure injury that I see whereas in weeks past it was week by week I was constantly seeing new pressure injuries. Overall I think it has been very beneficial for her. 07/03/18; patient arrives in my clinic today. She has deep punched out areas over her bilateral lateral malleoli. The area on the right has some more depth. We spent a lot of time today talking about pressure relief for these areas. This started when her daughter asked for a  prescription for a memory foam mattress. I have never written a prescription for a mattress and I don't think insurances would pay for that on an ordinary bed. In any case he came up that she has foam boots that she refuses to wear. I would suggest going to these before any other offloading issues when she is in bed. They say she is meticulous about offloading this the rest of the day 07/10/18- She is seen in follow-up evaluation for bilateral, lateral malleolus ulcers. There is no improvement in the ulcers. She has purchased and is sleeping on a memory foam mattress/overlay, she has been using the offloading boots nightly over the past week. She has a follow up appointment with vascular medicine at the end of October, in my opinion this follow up should be expedited given her deterioration and suboptimal TBI results. We will order plain film xray of the left ankle as deeper structures are palpable; would consider having MRI, regardless of xray report(s). The ulcers will be treated with iodoflex/iodosorb, she is unable to safely change the dressings daily with santyl. 07/19/18 on evaluation today patient appears to be doing in general visually well in regard to her bilateral lateral malleolus ulcers. She has been tolerating the dressing changes without complication which is good news. With that being said we did have an x-ray performed on 07/12/18 which revealed a slight loosen see in the lateral portion of the distal left fibula which may represent artifact but underline lytic destruction or osteomyelitis could not be excluded. MRI was recommended. With that being said we can see about getting the patient scheduled for an MRI to further evaluate this area. In fact we have that scheduled currently for August 20 19,019. 07/26/18 on evaluation today patient's wound on the right lateral ankle actually appears to be doing fairly well at this point in my pinion. She has made some good progress currently. With  that being said unfortunately in regard to the left lateral ankle ulcer this seems to be a little bit more problematic at this time. In fact as I further evaluated the situation she actually had bone exposed which is the first time that's been the case in the bone appear to be necrotic. Currently I did review patient's note from Dr. Bunnie Domino office with [facility]. He stated that ABI was 1.26 on the right and 0.95 on the  left with good waveforms. Her perfusion is stable not reduced from previous studies and her digital waveforms were pretty good particularly on the right. His conclusion upon review of the note was that there was not much she could do to improve her perfusion and he felt she was adequate for wound healing. His suggestion was that she continued to see [place] and consider a synthetic skin graft if there was no underlying infection. He plans to see her back in six months or as needed. 08/01/18 on evaluation today patient appears to be doing better in regard to her right lateral ankle ulcer. Her left lateral ankle ulcer is about the same she still has bone involvement in evidence of necrosis. There does not appear to be evidence of infection at this time On the right lateral lower extremity. I have started her on the Augmentin she picked this up and started this yesterday. This is to get her through until she sees infectious disease which is scheduled for 08/12/18. 08/06/18 on evaluation today patient appears to be doing rather well considering my discussion with patient's daughter at the end of last week. The area which was marked where she had erythema seems to be improved and this is good news. With that being said overall the patient seems to be making good improvement when it comes to the overall appearance of the right lateral ankle ulcer although this has been slow she at least is coming around in this regard. Unfortunately in regard to the left lateral ankle ulcer this  is osteomyelitis based on the pathology report as well is bone culture. Nonetheless we are still waiting CT scan. Unfortunately the MRI we originally ordered cannot be performed as the patient is a pacemaker which I had overlooked. Nonetheless we are working on the CT scan approval and scheduling as of now. She did go to the hospital over the weekend and was placed on IV Cefzo for a couple of days. Fortunately this seems to have improved the erythema quite significantly which is good news. There does not appear to be any evidence of worsening infection at this time. She did have some bleeding after the last debridement therefore I did not perform any sharp debridement in regard to left lateral ankle at this point. Patient has been approved for a snap vac for the right lateral ankle. 08/14/18; the patient with wounds over her bilateral lateral malleoli. The area on the right actually looks quite good. Been using a snap back on this area. Healthy granulation and appears to be filling in. Unfortunately the area on the left is really problematic. 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 Also noted to have cellulitis. She saw Dr. Novella Olive of infectious disease today and was put on linezolid. We are able to verify this with her pharmacy. She is completed the Augmentin that she was already on. We've been using Iodoflex to this area 08/23/18 on evaluation today patient's wounds both actually appear to be doing better compared to my prior evaluations. Fortunately she showing signs of good improvement in regard to the overall wound status especially where were using the snap vac on the right. In regard to left lateral malleolus the wound bed actually appears to be much cleaner than previously noted. I do not feel any phone directly probed during evaluation today and though there is tendon noted this does not appear to be necrotic  it's actually fairly good  as far as the overall appearance of the tendon is concerned. In general the wound bed actually appears to be doing significantly better than it was previous. Patient is currently in the care of [name] and I did review that note today. He actually has her on two weeks of linezolid and then following the patient will be on 1-2 months of Keflex. That is the plan currently. She has been on antibiotics therapy as prescribed by myself initially starting on July 30, 2018 and has been on that continuously up to this point. 08/30/18 on evaluation today patient actually appears to be doing much better in regard to her right lateral malleolus ulcer. She has been tolerating the dressing changes specifically the snap vac without complication although she did have some issues with the seal currently. Apparently there was some trouble with getting it to maintain over the past week past Sunday. Nonetheless overall the wound appears better in regard to the right lateral malleolus region. In regard to left lateral malleolus this actually show some signs of additional granulation although there still tendon noted in the base of the wound this appears to be healthy not necrotic in any way whatsoever. We are considering potentially using a snap vac for the left lateral malleolus as well the product wrap from [employer], [place], was present in the clinic today we're going to see this patient I did have her come in with me after obtaining consent from the patient and her daughter in order to look at the wound and see if there's any recommendation one way or another as to whether or not they felt the snapback could be beneficial for the left lateral malleolus region. But the conclusion was that it might be but that this is definitely a little bit deeper wound than what traditionally would be utilized for a snap vac. 09/06/18 on evaluation today patient actually appears to be doing excellent in my pinion  in regard to both ankle ulcers. She has been tolerating the dressing changes without complication which is great news. Specifically we have been using the snap vac. In regard to the right ankle I'm not even sure that this is going to be necessary for today and following as the wound has filled in quite nicely. In regard to the left ankle I do believe [name], [name] ([id number]) that we're seeing excellent epithelialization from the edge as well as granulation in the central portion the tendon is still exposed but there's no evidence of necrotic bone and in general I feel like the patient has made excellent progress even compared to last week with just one week of the snap vac. 09/11/18; this is a patient who has wounds on her bilateral lateral malleoli. Initially both of these were deep stage IV wounds in the setting of chronic arterial insufficiency. She has been revascularized. As I understand think she been using snap vacs to both of these wounds however the area on the right became more superficial and currently she is only using it on the left. Using silver collagen on the right and silver collagen under the back on the left I believe 09/19/18 on evaluation today patient actually appears to be doing very well in regard to her lateral malleolus or ulcers bilaterally. She has been tolerating the dressing changes without complication. Fortunately there does not appear to be any evidence of infection at this time. Overall I feel like she is improving in an excellent manner and I'm very pleased with the fact that  everything seems to be turning towards the better for her. This has obviously been a long road. 09/27/18 on evaluation today patient actually appears to be doing very well in regard to her bilateral lateral malleolus ulcers. She has been tolerating the dressing changes without complication. Fortunately there does not appear to be any evidence of infection at this time which is also great  news. No fevers, chills, nausea, or vomiting noted at this time. Overall I feel like she is doing excellent with the snap vac on the left malleolus. She had 40 mL of fluid collection over the past week. 10/04/18 on evaluation today patient actually appears to be doing well in regard to her bilateral lateral malleolus ulcers. She continues to tolerate the dressing changes without complication. One issue that I see is the snap vac on the left lateral malleolus which appears to have sealed off some fluid underlying this area and has not really allowed it to heal to the degree that I would like to see. For that reason I did suggest at this point we may want to pack a small piece of packing strip into this region to allow it to more effectively wick out fluid. 10/11/18 in general the patient today does not feel that she has been doing very well. She's been a little bit lethargic and subsequently is having bodyaches as well according to what she tells me today. With that being said overall she has been concerned with the fact that something may be worsening although to be honest her wounds really have not been appearing poorly. She does have a new ulcer on her left heel unfortunately. This may be pressure related. Nonetheless it seems to me to have potentially started at least as a blister I do not see any evidence of deep tissue injury. In regard to the left ankle the snap vac still seems to be causing the ceiling off of the deeper part of the wound which is in turn trapping fluid. I'm not extremely pleased with the overall appearance as far as progress from last week to this week therefore I'm gonna discontinue the snap vac at this point. 10/18/18 patient unfortunately this point has not been feeling well for the past several days. She was seen by Grayland Ormond her primary care provider who is a Librarian, academic at [facility]. Subsequently she states that she's been very weak and generally feeling  malaise. No fevers, chills, nausea, or vomiting noted at this time. With that being said bloodwork was performed at the PCP office on the 11th of this month which showed a white blood cell count of 10.7. This was repeated today and shows a white blood cell count of 12.4. This does show signs of worsening. Coupled with the fact that she is feeling worse and that her left ankle wound is not really showing signs of improvement I feel like this is an indication that the osteomyelitis is likely exacerbating not improving. Overall I think we may also want to check her C-reactive protein and sedimentation rate. Actually did call [facility] office this afternoon while the patient was in the office here with me. Subsequently based on the findings we discussed treatment possibilities and I think that it is appropriate for [place] to go ahead and initiate treatment with doxycycline which I'm going to do. Subsequently he did agree to see about adding a CRP and sedimentation rate to her orders. If that has not already been drawn to where they can run it they  will contact the patient she can come back to have that check. They are in agreement with plan as far as the patient and her daughter are concerned. Nonetheless also think we need to get in touch with Dr. Henreitta Leber office to see about getting the patient scheduled with him as soon as possible. 11/08/18 on evaluation today patient presents for follow-up concerning her bilateral foot and ankle ulcers. I did do an extensive review of her chart in epic today. Subsequently she was seen by [name] he did initiate Cefepime IV antibiotic therapy. Subsequently she had some issues with her PICC line this had to be removed because it was coiled and then replaced. Fortunately that was now settled. Unfortunately she has continued have issues with her left heel as well as the issues that she is experiencing with her bilateral lateral malleolus regions. I do believe however  both areas seem to be doing a little bit better on evaluation today which is good news. No fevers, chills, nausea, or vomiting noted at this time. She actually has an angiogram schedule with Dr. dew on this coming Monday, November 11, 2018. Subsequently the patient states that she is feeling much better especially than what she was roughly 2 weeks ago. She actually had to cancel an appointment because she was feeling so poorly. No fevers, chills, nausea, or vomiting noted at this time. 11/15/18 on evaluation today patient actually is status post having had her angiogram with Dr. dew Monday, four days ago. 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 He had to go and work on several areas of the vasculature fortunately he was able to obtain no more than a 30% residual stenosis throughout post procedure. I reviewed this note today. I think this will definitely help with healing at this time. Fortunately there does not appear to be any signs of infection and I do feel like ratio already has a better appearance to it. 11/22/18 upon evaluation today patient actually appears to be doing very well in regard to her wounds in general. The right lateral malleolus looks excellent the heel looks better in the left lateral malleolus also appears to be doing a little better. With that being said the right second toe actually appears to be open and training we been watching this is been dry and stable but now is open. 12/03/2018 Seen today for follow-up and management of multiple bilateral lower extremity wounds. New pressure injury of the great toe which is closed at this time. Wound of the right distal second toe appears larger today with deep undermining and a pocket of fluid present within the undermining region. Left and right malleolus is wounds are stable today with no signs and symptoms of infection.Denies any needs or concerns during exam today. 12/13/18 on evaluation today patient  appears to be doing somewhat better in regard to her left heel ulcer. She also seems to be completely healed in regard to the right lateral malleolus ulcer. The left malleolus ulcer is smaller what unfortunately the wounds which are new over the first and second toes of the right foot are what are most concerning at this point especially the second. Both areas did require sharp debridement today. 12/20/18 on evaluation today patient's wound actually appears to be doing better in regard to left lateral ankle and her right lateral ankle continues to remain healed. The hill ulcer on the left is improved. She does have improvement noted as well in regard to  both toe ulcers. Overall I'm very pleased in this regard. No fevers, chills, nausea, or vomiting noted at this time. 12/23/18 on evaluation today patient is seen after she had her toenails trimmed at the podiatrist office due to issues with her right great toe. There was what appeared to be dark eschar on the surface of the wound which had her in the podiatrist concerned. Nonetheless as I remember that during the last office visit I had utilize silver nitrate of this area I was much less concerned about the situation. Subsequently I was able to clean off much of this tissue without any complication today. This does not appear to show any signs of infection and actually look somewhat better Bernardi, [name] ([id number]) compared to last time post debridement. Her second toe on the right foot actually had callous over and there did appear still be some fluid underneath this that would require debridement today. 12/27/18 on evaluation today patient actually appears to be showing signs of improvement at all locations. Even the left lateral ankle although this is not quite as great as the other sites. Fortunately there does not appear to be any signs of infection at this time and both of her toes on the right foot seem to be showing signs of improvement  which is good news and very pleased in this regard. 01/03/19 on evaluation today patient appears to be doing better for the most part in regard to her wounds in particular. There does not appear to be any evidence of infection at this time which is good news. Fortunately there is no sign of really worsening anywhere except for the right great toe which she does have what appears to be a bruise/deep tissue injury which is very superficial and already resolving. I'm not sure where this came from I questioned her extensively and she does not recall what may have happened with this. Other than that the patient seems to be doing well even the left lateral ankle ulcer looks good and is getting smaller. 01/10/19 on evaluation today patient appears to be doing well in regard to her left heel wound and both of her toe wounds. Overall I feel like there is definitely improvement here and I'm happy in that regard. With that being said unfortunately she is having issues with the left lateral malleolus ulcer which unfortunately still has a lot of depth to it. This is gonna be a very difficult wound for [place] to be able to truly get to heal. I may want to consider some type of skin substitute to see if this would be of benefit for her. I'll discuss this with her more the next visit most likely. This was something I thought about more at the end of the visit when I was Artie out of the room and the patient had been discharged. 01/17/19 on evaluation today patient appears to be doing very well in regard to her wounds in general. She's been making excellent progress at this time. Fortunately there's no sign of infection at this time either. No fevers, chills, nausea, or vomiting noted at this time. The biggest issue is still her left lateral malleolus where it appears to be doing well and is getting smaller but still shows a small corner where this is deeper and goes down into what appears to be the joint space. Nonetheless  this is taking much longer to heal although it still looks better in smaller than previous evaluations. 01/24/19 on evaluation today patient's wounds actually appear  to be doing rather well in general overall. She did require some sharp debridement in regard to the right great toe but everything else appears to be doing excellent no debridement was even necessary. No fevers, chills, nausea, or vomiting noted at this time. 01/31/19 on evaluation today patient actually appears to be doing much better in regard to her left foot wound on the heel as well as the ankle. The right great toe appears to be a little bit worse today this had callous over and trapped a lot of fluid underneath. Fortunately there's no signs of infection at any site which is great news. 02/07/19 on evaluation today patient actually appears to be doing decently well in regard to all of her ulcers at this point. No sharp debridement was required she is a little bit of hyper granulation in regard to the left lateral ankle as well as the left heel but the hill itself is almost completely healed which is excellent news. Overall been very pleased in this regard. 02/14/19 on evaluation today patient actually appears to be doing very well in regard to her ulcers on the right first toe, left lateral malleolus, and left heel. In fact the heel is almost completely healed at this point. The patient does not show any signs of infection which is good news. Overall very pleased with how things have progressed. 04/18/19 Telehealth Evaluation During the COVID-19 National Emergency: Verbal Consent: Obtained from patient Allergies: reviewed and the active list is current. Medication changes: patient has no current medication changes. COVID-19 Screening: 1. Have you traveled internationally or on a cruise ship in the last 14 dayso No 2. Have you had contact with someone with or under investigation for COVID-19o No 3. Have you had a fever, cough, sore  throat, or experiencing shortness of breatho No on evaluation today actually did have a visit with this patient through a telehealth encounter with her home health nurse. Subsequently it was noted that the patient actually appears to be doing okay in regard to her wounds both the right great toe as well as the left lateral malleolus have shown signs of improvement although this in your theme around the left lateral malleolus there eschar coverings for both locations. The question is whether or not they are actually close and whether or not home health needs to discharge the patient or not. Nonetheless my concern is this point obviously is that without actually seeing her and being able to evaluate this directly I cannot ensure that she is completely healed which is the question that I'm being asked. 04/22/19 on evaluation today patient presents for her first evaluation since last time I saw her which was actually February 14, 2019. I did do a telehealth visit last week in which point it was questionable whether or not she may be healed and had to bring her in today for confirmation. With that being said she does seem to be doing quite well at this point which is good news. There does not appear to be any drainage in the deed I believe her wounds may be healed. Readmission: 09/04/2019 on evaluation today patient appears to be doing unfortunately somewhat more poorly in regard to her left foot ulcer secondary to a wound that began on 08/21/2019 at least when she first noticed this. Fortunately she has not had any evidence of active infection at this time. Systemically. I also do not necessarily see any evidence of infection at the blister/wound site on the first metatarsal head plantar  aspect. This almost appears to be something that may have just rubbed inappropriately causing this to breakdown. They did not want a wait too long to come in to be seen as again she had significant issues in the past with  wounds that took quite a while to heal in fact it was close to 2 years. Nonetheless this does not appear to be quite that bad but again we do need to remove some of the necrotic tissue from the surface of the wound to tell exactly the extent. She does not appear to have any significant arterial disease at this point and again her last ABIs and TBI's are recorded above in the alert section her left ABI was 1.27 with a TBI of 0.72 to the right ABI 1.08 with a TBI of 0.39. Other than this the patient has been doing quite well since I last saw her and that was in May 2020. 09/11/2019 on evaluation today patient appeared to be doing very well with regard to her plantar foot ulcer on the left. In fact this appears to be almost completely healed which is awesome. That is after just 1 week of intervention. With that being said there is no signs of active infection at this time. PERCY, COMP ([id number]) 09/18/2019 on evaluation today patient actually appears to be doing excellent in fact she is completely healed based on what I am seeing at this point. Fortunately there is no signs of active infection at this time and overall patient is very pleased to hear that this area has healed so quickly. Readmission: 05/13/2021 upon evaluation today this patient presents for reevaluation here in the clinic. This is a wound that actually we previously took care of. She had 1 on the right ankle and the left the left turned out to be be harder due to to heal but nonetheless is doing great at this point as the right that has reopened and it was noted first just several weeks ago with a scab over it and came off in just the past few days. Fortunately there does not appear to be any obvious evidence of significant active infection at this time which is great news. No fevers, chills, nausea, vomiting, or diarrhea. 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 There does not  appear to be any signs of this interfering in any way with her wound. She does have swelling we previously had compression socks for her ordered but again it does not look like she wears these on a regular basis by any means. 05/26/2021 upon evaluation today patient appears to be doing well with regard to her wound which is actually showing signs of excellent improvement. There does not appear to be any signs of active infection which is great news and overall very pleased with where things stand today. No fevers, chills, nausea, vomiting, or diarrhea. 06/02/2021 upon evaluation today patient's wound actually showing signs of excellent improvement. Fortunately there does not appear to be any signs of active infection which is great news. I think the patient is making good progress with regard to her wounds in general. 06/09/2021 upon evaluation today patient appears to be doing excellent in regard to her wounds currently. Fortunately there does not appear to be any signs of active infection which is great news. No fevers, chills, nausea, vomiting, or diarrhea. Overall extremely pleased with where things stand today. I think the patient is making excellent progress. 06/16/2021 upon evaluation  today patient appears to be doing well in regard to her wound. This is going require little bit of debridement today and that was discussed with the patient. Otherwise she seems to be doing quite well and I am actually very pleased with where things stand at this point. No fevers, chills, nausea, vomiting, or diarrhea. 06/23/2021 upon evaluation today patient appears to be doing well with regard to her wounds. She has been tolerating the dressing changes without complication. Fortunately there does not appear to be any evidence of infection and she has not had air in her home which she actually lives at an assisted living that got fixed this morning. With that being said because of that her wrap has been extremely hot  and bothersome for her over the past week. 06/30/2021 upon evaluation today patient is actually making excellent progress in regard to her ankle ulcer. She has been tolerating the dressing changes without complication and overall extremely pleased with where things stand there does not appear to be any evidence of active infection which is great news. No fevers, chills, nausea, vomiting, or diarrhea. 07/07/21 upon evaluation today patients and culture on the right actually appears to be doing quite well. There does not appear to be any signs of infection and overall very pleased with where things stand today. No fevers, chills, nausea, or vomiting noted at this time. 07/14/2021 unfortunately the patient today has some evidence of deep tissue injury and pressure getting to the ankle region. Again I am not exactly sure what is going on here but this is very similar to issues that we have had in the past. I explained to the patient that she needs to be very mindful of exactly what is happening I think sleeping in bed is probably the main issue here although there could be other culprits I am not sure what else would potentially lead to this kind of a problem for her. 07/21/2021 upon evaluation today patient's wound actually showing signs of improvement compared to last week. Fortunately there does not appear to be any signs of active infection which is great news and overall very pleased with where things stand in that regard. With that being said I do believe that she is continuing to show signs of overall of getting better although I think this is still basically about what we were 2 weeks ago due to the worsening and now improvement. 07/28/2021 upon evaluation today patient appears to be doing well with regard to her wound. She does have some slough buildup on the surface of the wound which I would have to manage today. Fortunately there is no sign of active infection at this time. No fevers, chills,  nausea, vomiting, or diarrhea. 08/04/2021 upon evaluation today patient appears to be doing about the same in regard to her wound. To be perfectly honest I am beginning to be a little bit concerned about the overall appearance of the wound bed. I do think possibly taking a sample right around the margin of the wound could be beneficial for her as far as identifying anything such as an inflammatory process or to be honest even a skin tag cancer type process that may be of concern here. Fortunately there does not appear to be any evidence of active infection at this time which is great news she is not having any pain also great news. 08/11/2021 upon evaluation today patient appears to be doing well with regard to her wound. The good news is I did review  her biopsy results and it showed some inflammatory mixed findings but nothing that appeared to be malignant which is great news. Overall this is more of a chronic venous stasis type issue which again is more what we have been treating. Nonetheless I just wanted to make sure before going forward that there was not anything more untoward going on at this point. 08/18/2021 upon evaluation today patient appears to be doing well with regard to her ankle ulcer. Fortunately there does not appear to be any signs of active infection at this time which is great overall wound is dramatically improved compared to last week. Since last week I have actually placed her on doxycycline and subsequently this is a good option as far as the findings are concerned at this point. 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 the doxycycline is doing that. There does not appear to be any evidence of active infection systemically which is great news. 08/25/2021 upon evaluation today patient appears to be doing well with regard to her wound. I feel like we are  finally get back on track as far as healing is concerned I am much happier with the overall appearance today. I do think that she is tolerating the dressing changes without complication which is great news. We have been using Hydrofera Blue which I think is a good option. The good news is she is also doing great in regard to her compression sock on the left which is a zipper compression that seems to be doing a great job keeping her edema under good control. 09/01/2021 upon evaluation today patient appears to be doing well with regard to her wound. Infection seems to be under much better control which is great news and very pleased in that regard. Fortunately there does not appear to be any signs of infection currently. [name], [name] ([id number]) 09/08/2021 upon evaluation today patient actually appears to be making good progress in regard to her wound. She has been tolerating the dressing changes without complication. Fortunately there does not appear to be any evidence of active infection at this time which is great news as well. No fevers, chills, nausea, vomiting, or diarrhea. 09/22/2021 upon evaluation today patient appears to be doing well with regard to her wound although is very slow to heal. We have not looked into Apligraf yet I think that is something that we should see about doing. 09/29/2021 upon evaluation today patient's wound is actually showing signs of doing about the same. I am not seeing any evidence of worsening but also no significant evidence of improvement. We did gain approval for the organogenesis products all except for Apligraf as covered by her insurance. With that being said I do think that we can go ahead and proceed with the NuShield if the patient and her family in agreement of the plan I discussed that with him today she does have a 20% coinsurance which we also discussed. 10/06/2021 upon evaluation today patient appears to unfortunately be doing a little bit  worse she appears to be infected based on what I am seeing. Fortunately there does not appear to be any signs of active infection at this time which is great news. Unfortunately it does appear to be some evidence of around the wound edge indicated by way of erythema and warmth as well as redness 10/13/2021 upon evaluation today patient  actually appears to be doing excellent in regard to her ankle ulcer compared to what it was. 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 I think were fine to go ahead and apply the NuShield today. 10/20/2021 upon evaluation today patient appears to be doing excellent in fact the NuShield seems to have done all some for her thus far. I am actually very pleased with where we stand and overall I think that she is making great progress. There is no evidence of active infection at this time. Electronic Signature(s) Signed: 10/20/2021 10:47:55 AM By: Worthy Keeler PA-C Entered By: Worthy Keeler on 10/20/2021 10:47:55 Hackman, [name] ([id number]) -------------------------------------------------------------------------------- Physical Exam Details Patient Name: [name] Date of Service: 10/20/2021 10:15 AM Medical Record Number: [id number] Patient Account Number: [id number] Date of Birth/Sex: [birth year] (87 y.o. F) Treating RN: Carlene Coria Primary Care Provider: Adrian Prows Other Clinician: Referring Provider: Adrian Prows Treating Provider/Extender: Skipper Cliche in Treatment: 3 Constitutional Well-nourished and well-hydrated in no acute distress. Respiratory normal breathing without difficulty. Psychiatric this patient is able to make decisions and demonstrates good insight into disease process. Alert and Oriented x 3. pleasant and  cooperative. Notes Patient's wound bed showed signs of good granulation and epithelization at this point. Fortunately there does not appear to be any evidence of infection systemically which is great and overall I am extremely happy with where we stand today. Electronic Signature(s) Signed: 10/20/2021 10:48:09 AM By: Worthy Keeler PA-C Entered By: Worthy Keeler on 10/20/2021 10:48:09 Joni Fears [name] ([id number]) -------------------------------------------------------------------------------- Physician Orders Details Patient Name: [name] Date of Service: 10/20/2021 10:15 AM Medical Record Number: [id number] Patient Account Number: [id number] Date of Birth/Sex: 1923/02/12 (87 y.o. 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 Bathing/ Shower/ Hygiene o May shower with wound dressing protected with water repellent cover or cast protector. Cellular or Tissue Based Products o Cellular or Tissue Based Product Type: - NuShield #2 o Cellular or Tissue Based Product applied to wound bed; including contact layer, fixation with steri-strips, dry gauze and cover dressing. (DO NOT REMOVE). Edema Control - Lymphedema / Segmental Compressive Device / Other o Optional: One layer of unna paste to top of compression wrap (to act as an anchor). - and at toes o Elevate, Exercise Daily and Avoid Standing for Long Periods of Time. o Elevate legs to the level of the heart and pump  ankles as often as possible o Elevate leg(s) parallel to the floor when sitting. Wound Treatment Wound #7 - Malleolus Wound Laterality: Right, Lateral Cleanser: Soap and Water 1 x Per Week/30 Days Discharge Instructions: Gently cleanse wound with antibacterial soap, rinse and pat dry prior to dressing wounds Secondary Dressing: Gauze 1 x Per Week/30 Days Discharge Instructions: As directed: dry, moistened with saline or moistened with Dakins Solution Compression Wrap: Profore Lite LF 3 Multilayer Compression Bandaging System 1 x Per Week/30 Days Discharge Instructions:  Apply 3 multi-layer wrap as prescribed. Electronic Signature(s) Signed: 10/20/2021 5:41:09 PM By: Worthy Keeler PA-C Signed: 10/21/2021 10:13:49 AM By: Carlene Coria RN Entered By: Carlene Coria on 10/20/2021 10:56:57 Casler, [name] ([id number]) -------------------------------------------------------------------------------- Problem List Details Patient Name: [name], [name] Date of Service: 10/20/2021 10:15 AM Medical Record Number: [id number] Patient Account Number: [id number] Date of Birth/Sex: 01/19/1923 (87 y.o. 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, stage 3 05/13/2021 No Yes I89.0 Lymphedema, not elsewhere classified 05/13/2021 No Yes I87.2 Venous insufficiency (chronic) (peripheral) 05/13/2021 No Yes Z95.0 Presence of cardiac pacemaker 05/13/2021 No Yes Z79.01 Long term (current) use of anticoagulants 05/13/2021 No Yes Inactive Problems Resolved Problems Electronic Signature(s) Signed: 10/20/2021 10:15:14 AM By: Worthy Keeler PA-C Entered By: Worthy Keeler on 10/20/2021 10:15:14 Nixon, [name]  ([id number]) -------------------------------------------------------------------------------- Progress Note Details Patient Name: [name] Date of Service: 10/20/2021 10:15 AM Medical Record Number: [id number] Patient Account Number: [id number] Date of Birth/Sex: 07-11-1923 (87 y.o. 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 Dr. Amalia Hailey the podiatrist saw her on 07/20/2017 and sent her to the wound center for possible hyperbaric oxygen therapy. past medical history of peripheral vascular disease, varicose veins, status post appendectomy, basal cell carcinoma excision from the left leg, cholecystectomy, pacemaker placement, right lower extremity angiography done by Dr. dew in March 2017 with placement of a stent. there is also note of a successful ablation of the right small saphenous vein done which was reviewed by ultrasound on 10/24/2016. the patient had a right small saphenous vein ablation done on 10/20/2016. The patient has never been a smoker. She has been seen by Dr. Corene Cornea dew the vascular surgeon who most recently saw her on 06/15/2017 for evaluation of ongoing problems with right leg swelling. She had a lower extremity arterial duplex examination done(02/13/17) which showed patent distal right superficial femoral artery stent and above-the-knee popliteal stent without evidence of restenosis. The ABI was more than 1.3 on the right and more than 1.3 on the left. This was consistent with noncompressible arteries due to medial  calcification. The right great toe pressure and PPG waveforms are within normal limits and the left great toe pressure and PPG waveforms are decreased. he recommended she continue to wear her compression stockings and continue with elevation. She is scheduled to have a noninvasive arterial study in the near future 08/16/2017 -- had a lower extremity arterial duplex examination done which showed patent distal right superficial femoral artery stent and above-the-knee popliteal stent without evidence of restenosis. The ABI was more than 1.3 on the right and more than 1.3 on the left. This was consistent with noncompressible arteries due to medial calcification. The right great toe pressure and PPG waveforms are within normal limits and the left great toe pressure and PPG waveforms are decreased. the x-ray of the right ankle has not yet been done 08/24/2017 -- had a right ankle x-ray -- IMPRESSION:1. No fracture, bone lesion or evidence  of osteomyelitis. 2. Lateral soft tissue swelling with a soft tissue ulcer. she has not yet seen the vascular surgeon for review 08/31/17 on evaluation today patient's wound appears to be showing signs of improvement. She still with her appointment with vascular in order to review her results of her vascular study and then determine if any intervention would be recommended at that time. No fevers, chills, nausea, or vomiting noted at this time. She has been tolerating the dressing changes without complication. 09/28/17 on evaluation today patient's wound appears to show signs of good improvement in regard to the granulation tissue which is surfacing. There is still a layer of slough covering the wound and the posterior portion is still significantly deeper than the anterior nonetheless there has been some good sign of things moving towards the better. She is going to go back to Dr. dew for reevaluation to ensure her blood flow is still appropriate. That will be before  her next evaluation with [place] next week. No fevers, chills, nausea, or vomiting noted at this time. Patient does have some discomfort rated to be a 3-4/10 depending on activity specifically cleansing the wound makes it worse. 10/05/2017 -- the patient was seen by Dr. Lucky Cowboy last week and noninvasive studies showed a normal right ABI with brisk triphasic waveforms consistent with no arterial insufficiency including normal digital pressures. The duplex showed a patent distal right SFA stent and the proximal SFA was also normal. He was pleased with her test and thought she should have enough of perfusion for normal wound healing. He would see her back in 6 months time. 12/21/17 on evaluation today patient appears to be doing fairly well in regard to her right lateral ankle wound. Unfortunately the main issue that she is expansion at this point is that she is having some issues with what appears to be some cellulitis in the right anterior shin. She has also been noting a little bit of uncomfortable feeling especially last night and her ankle area. I'm afraid that she made the developing a little bit of an infection. With that being said I think it is in the early stages. 12/28/17 on evaluation today patient's ankle appears to be doing excellent. She's making good progress at this point the cellulitis seems to have improved after last week's evaluation. Overall she is having no significant discomfort which is excellent news. She does have an appointment with Dr. dew on March 29, 2018 for reevaluation in regard to the stent he placed. She seems to have excellent blood flow in the right lower extremity. 01/19/12 on evaluation today patient's wound appears to be doing very well. In fact she does not appear to require debridement at this point, there's no evidence of infection, and overall from the standpoint of the wound she seems to be doing very well. With that being said I believe that it may be time to switch to  different dressing away from the [facility] Dressing she tells me she does have a lot going on her friend actually passed away yesterday and she's also having a lot of issues with her husband this obviously is weighing heavy on her as far as your thoughts and concerns today. 01/25/18 on evaluation today patient appears to be doing fairly well in regard to her right lateral malleolus. She has been tolerating the dressing changes without complication. Overall I feel like this is definitely showing signs of improvement as far as how the overall appearance of the wound is there's also evidence  of epithelium start to migrate over the granulation tissue. In general I think that she is progressing nicely as far as the wound is concerned. The only concern she really has is whether or not we can switch to every other week visits in order to avoid having as many REDELL, [name] ([id number]) appointments as her daughters have a difficult time getting her to her appointments as well as the patient's husband to his he is not doing very well at this point. 02/22/18 on evaluation today patient's right lateral malleolus ulcer appears to be doing great. She has been tolerating the dressing changes without complication. Overall you making excellent progress at this time. Patient is having no significant discomfort. 03/15/18 on evaluation today patient appears to be doing much more poorly in regard to her right lateral ankle ulcer at this point. Unfortunately since have last seen her her husband has passed just a few days ago is obviously weighed heavily on her her daughter also had surgery well she is with her today as usual. There does not appear to be any evidence of infection she does seem to have significant contusion/deep tissue injury to the right lateral malleolus which was not noted previous when I saw her last. It's hard to tell of exactly when this injury occurred although during the time she was  spending the night in the hospital this may have been most likely. 03/22/18 on evaluation today patient appears to actually be doing very well in regard to her ulcer. She did unfortunately have a setback which was noted last week however the good news is we seem to be getting back on track and in fact the wound in the core did still have some necrotic tissue which will be addressed at this point today but in general I'm seeing signs that things are on the up and up. She is glad to hear this obviously she's been somewhat concerned that due to the how her wound digressed more recently. 03/29/18 on evaluation today patient appears to be doing fairly well in regard to her right lower extremity lateral malleolus ulcer. She unfortunately does have a new area of pressure injury over the inferior portion where the wound has opened up a little bit larger secondary to the pressure she seems to be getting. She does tell me sometimes when she sleeps at night that it actually hurts and does seem to be pushing on the area little bit more unfortunately. There does not appear to be any evidence of infection which is good news. She has been tolerating the dressing changes without complication. She also did have some bruising in the left second and third toes due to the fact that she may have bump this or injured it although she has neuropathy so she does not feel she did move recently that may have been where this came from. Nonetheless there does not appear to be any evidence of infection at this time. 04/12/18 on evaluation today patient's wound on the right lateral ankle actually appears to be doing a little bit better with a lot of necrotic docking tissue centrally loosening up in clearing away. However she does have the beginnings of a deep tissue injury on the left lateral malleolus likely due to the fact we've been trying offload the right as much as we have. I think she may benefit from an assistive soft device to  help with offloading and it looks like they're looking at one of the doughnut conditions that wraps around the  lower leg to offload which I think will definitely do a good job. With that being said I think we definitely need to address this issue on the left before it becomes a wound. Patient is not having significant pain. 04/19/18 on evaluation today patient appears to be doing excellent in regard to the progress she's made with her right lateral ankle ulcer. The left ankle region which did show evidence of a deep tissue injury seems to be resolving there's little fluid noted underneath and a blister there's nothing open at this point in time overall I feel like this is progressing nicely which is good news. She does not seem to be having significant discomfort at this point which is also good news. 04/25/18-She is here in follow up evaluation for bilateral lateral malleolar ulcers. The right lateral malleolus ulcer with pale subcutaneous tissue exposure, central area of ulcer with tendon/periosteum exposed. The left lateral malleolus ulcer now with central area of nonviable tissue, otherwise deep tissue injury. She is wearing compression wraps to the left lower extremity, she will place the right lower extremity compression wraps on when she gets home. She will be out of town over the weekend and return next week and follow-up appointment. She completed her doxycycline this morning 05/03/18 on evaluation today patient appears to be doing very well in regard to her right lateral ankle ulcer in general. At least she's showing some signs of improvement in this regard. Unfortunately she has some additional injury to the left lateral malleolus region which appears to be new likely even over the past several days. Again this determination is based on the overall appearance. With that being said the patient is obviously frustrated about this currently. 05/10/18-She is here in follow-up evaluation for  bilateral lateral malleolar ulcers. She states she has purchased offloading shoes/boots and they will arrive tomorrow. She was asked to bring them in the office at next week's appointment so her provider is aware of product being utilized. She continues to sleep on right or left side, she has been encouraged to sleep on her back. The right lateral malleolus ulcer is precariously close to peri-osteum; will order xray. The left lateral malleolus ulcer is improved. Will switch back to santyl; she will follow up next week. 05/17/18 on evaluation today patient actually appears to be doing very well in regard to her malleolus her ulcers compared to last time I saw them. She does not seem to have as much in the way of contusion at this point which is great news. With that being said she does continue to have discomfort and I do believe that she is still continuing to benefit from the offloading/pressure reducing boots that were recommended. I think this is the key to trying to get this to heal up completely. 05/24/18 on evaluation today patient actually appears to be doing worse at this point in time unfortunately compared to her last week's evaluation. She is having really no increased pain which is good news unfortunately she does have more maceration in your theme and noted surrounding the right lateral ankle the left lateral ankle is not really is erythematous I do not see signs of the overt cellulitis on that side. Unfortunately the wounds do not seem to have shown any signs of improvement since the last evaluation. She also has significant swelling especially on the right compared to previous some of this may be due to infection however also think that she may be served better while she has these wounds  by compression wrapping versus continuing to use the Juxta-Lite for the time being. Especially with the amount of drainage that she is experiencing at this point. No fevers, chills, nausea, or vomiting  noted at this time. 05/31/18 on evaluation today patient appears to actually be doing better in regard to her right lateral lower extremity ulcer specifically on the malleolus region. She has been tolerating the antibiotic without complication. With that being said she still continues to have issues but a little bit of redness although nothing like she what she was experiencing previous. She still continues to pressure to her ankle area she did get the problem on offloading boots unfortunately she will not wear them she states there too uncomfortable and she can't get in and out of the bed. Nonetheless at this point her wounds seem to be continually getting worse which is not what we want I'm getting somewhat concerned about her progress and how things are going to proceed if we do not intervene in some way shape or form. I therefore had a very lengthy conversation today about offloading yet again and even made a specific suggestion for switching her to a memory foam mattress and even gave the information for a specific one that they could look at getting if it was something that they were interested in considering. She does not want to be considered for a hospital bed air mattress although honestly insurance would not cover it that she does not have any wounds on her trunk. 06/14/18 on evaluation today both wounds over the bilateral lateral malleolus her ulcers appear to be doing better there's no evidence of pressure injury at this point. She did get the foam mattress for her bed and this does seem to have been extremely beneficial for her in my pinion. Her daughter states that she is having difficulty getting out of bed because of how soft it is. The patient also relates this to be. Nonetheless I do feel like she's actually doing better. Unfortunately right after and around the time she was getting the mattress she also sustained a fall when she got up to go pick up the phone and ended up injuring her  right elbow she has 18 sutures in place. We are not caring for this currently although home health is going to be taking the sutures out shortly. Nonetheless this may be something that we need to evaluate going forward. It depends on [name], [name] ([id number]) how well it has or has not healed in the end. She also recently saw an orthopedic specialist for an injection in the right shoulder just before her fall unfortunately the fall seems to have worsened her pain. 06/21/18 on evaluation today patient appears to be doing about the same in regard to her lateral malleolus ulcers. Both appear to be just a little bit deeper but again we are clinging away the necrotic and dead tissue which I think is why this is progressing towards a deeper realm as opposed to improving from my measurement standpoint in that regard. Nonetheless she has been tolerating the dressing changes she absolutely hates the memory foam mattress topper that was obtained for her nonetheless I do believe this is still doing excellent as far as taking care of excess pressure in regard to the lateral malleolus regions. She in fact has no pressure injury that I see whereas in weeks past it was week by week I was constantly seeing new pressure injuries. Overall I think it has been very beneficial for  her. 07/03/18; patient arrives in my clinic today. She has deep punched out areas over her bilateral lateral malleoli. The area on the right has some more depth. We spent a lot of time today talking about pressure relief for these areas. This started when her daughter asked for a prescription for a memory foam mattress. I have never written a prescription for a mattress and I don't think insurances would pay for that on an ordinary bed. In any case he came up that she has foam boots that she refuses to wear. I would suggest going to these before any other offloading issues when she is in bed. They say she is meticulous about offloading this  the rest of the day 07/10/18- She is seen in follow-up evaluation for bilateral, lateral malleolus ulcers. There is no improvement in the ulcers. She has purchased and is sleeping on a memory foam mattress/overlay, she has been using the offloading boots nightly over the past week. She has a follow up appointment with vascular medicine at the end of October, in my opinion this follow up should be expedited given her deterioration and suboptimal TBI results. We will order plain film xray of the left ankle as deeper structures are palpable; would consider having MRI, regardless of xray report(s). The ulcers will be treated with iodoflex/iodosorb, she is unable to safely change the dressings daily with santyl. 07/19/18 on evaluation today patient appears to be doing in general visually well in regard to her bilateral lateral malleolus ulcers. She has been tolerating the dressing changes without complication which is good news. With that being said we did have an x-ray performed on 07/12/18 which revealed a slight loosen see in the lateral portion of the distal left fibula which may represent artifact but underline lytic destruction or osteomyelitis could not be excluded. MRI was recommended. With that being said we can see about getting the patient scheduled for an MRI to further evaluate this area. In fact we have that scheduled currently for August 20 19,019. 07/26/18 on evaluation today patient's wound on the right lateral ankle actually appears to be doing fairly well at this point in my pinion. She has made some good progress currently. With that being said unfortunately in regard to the left lateral ankle ulcer this seems to be a little bit more problematic at this time. In fact as I further evaluated the situation she actually had bone exposed which is the first time that's been the case in the bone appear to be necrotic. Currently I did review patient's note from Dr. Bunnie Domino office with [facility]. He stated that ABI was 1.26 on the right and 0.95 on the left with good waveforms. Her perfusion is stable not reduced from previous studies and her digital waveforms were pretty good particularly on the right. His conclusion upon review of the note was that there was not much she could do to improve her perfusion and he felt she was adequate for wound healing. His suggestion was that she continued to see [place] and consider a synthetic skin graft if there was no underlying infection. He plans to see her back in six months or as needed. 08/01/18 on evaluation today patient appears to be doing better in regard to her right lateral ankle ulcer. Her left lateral ankle ulcer is about the same she still has bone involvement in evidence of necrosis. There does not appear to be evidence of infection at this time On the right lateral lower  extremity. I have started her on the Augmentin she picked this up and started this yesterday. This is to get her through until she sees infectious disease which is scheduled for 08/12/18. 08/06/18 on evaluation today patient appears to be doing rather well considering my discussion with patient's daughter at the end of last week. The area which was marked where she had erythema seems to be improved and this is good news. With that being said overall the patient seems to be making good improvement when it comes to the overall appearance of the right lateral ankle ulcer although this has been slow she at least is coming around in this regard. Unfortunately in regard to the left lateral ankle ulcer this is osteomyelitis based on the pathology report as well is bone culture. Nonetheless we are still waiting CT scan. Unfortunately the MRI we originally ordered cannot be performed as the patient is a pacemaker which I had overlooked. Nonetheless we are working on the CT scan approval and scheduling as of now. She did go to the hospital over the weekend and was placed on  IV Cefzo for a couple of days. Fortunately this seems to have improved the erythema quite significantly which is good news. There does not appear to be any evidence of worsening infection at this time. She did have some bleeding after the last debridement therefore I did not perform any sharp debridement in regard to left lateral ankle at this point. Patient has been approved for a snap vac for the right lateral ankle. 08/14/18; the patient with wounds over her bilateral lateral malleoli. The area on the right actually looks quite good. Been using a snap back on this area. Healthy granulation and appears to be filling in. Unfortunately the area on the left is really problematic. 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 Also noted to have cellulitis. She saw Dr. Novella Olive of infectious disease today and was put on linezolid. We are able to verify this with her pharmacy. She is completed the Augmentin that she was already on. We've been using Iodoflex to this area 08/23/18 on evaluation today patient's wounds both actually appear to be doing better compared to my prior evaluations. Fortunately she showing signs of good improvement in regard to the overall wound status especially where were using the snap vac on the right. In regard to left lateral malleolus the wound bed actually appears to be much cleaner than previously noted. I do not feel any phone directly probed during evaluation today and though there is tendon noted this does not appear to be necrotic it's actually fairly good as far as the overall appearance of the tendon is concerned. In general the wound bed actually appears to be doing significantly better than it was previous. Patient is currently in the care of [name] and I did review that note today. He actually has her on two weeks of linezolid and then following the patient will be on 1-2 months of Keflex.  That is the plan currently. She has been on antibiotics therapy as prescribed by myself initially starting on July 30, 2018 and has been on that continuously up to this point. 08/30/18 on evaluation today patient actually appears to be doing much better in regard to her right lateral malleolus ulcer. She has been tolerating the dressing changes specifically the snap vac without complication although she did have some issues with the seal currently. Apparently  there was some trouble with getting it to maintain over the past week past Sunday. Nonetheless overall the wound appears better in regard to the right lateral malleolus region. In regard to left lateral malleolus this actually show some signs of additional granulation although there still tendon noted in the base of the wound this appears to be healthy not necrotic in any way whatsoever. We are considering potentially using a snap vac for the left lateral malleolus as well the product wrap from [employer], [employer], was present in the clinic today we're going to see this patient I did have her come in with me after obtaining consent from the patient and her daughter in order to look at the wound and see if there's any recommendation one way or another as to whether or not they felt the snapback could be beneficial for the left lateral malleolus region. But LEN, KLUVER ([id number]) the conclusion was that it might be but that this is definitely a little bit deeper wound than what traditionally would be utilized for a snap vac. 09/06/18 on evaluation today patient actually appears to be doing excellent in my pinion in regard to both ankle ulcers. She has been tolerating the dressing changes without complication which is great news. Specifically we have been using the snap vac. In regard to the right ankle I'm not even sure that this is going to be necessary for today and following as the wound has filled in quite nicely. In regard to the left ankle I  do believe that we're seeing excellent epithelialization from the edge as well as granulation in the central portion the tendon is still exposed but there's no evidence of necrotic bone and in general I feel like the patient has made excellent progress even compared to last week with just one week of the snap vac. 09/11/18; this is a patient who has wounds on her bilateral lateral malleoli. Initially both of these were deep stage IV wounds in the setting of chronic arterial insufficiency. She has been revascularized. As I understand think she been using snap vacs to both of these wounds however the area on the right became more superficial and currently she is only using it on the left. Using silver collagen on the right and silver collagen under the back on the left I believe 09/19/18 on evaluation today patient actually appears to be doing very well in regard to her lateral malleolus or ulcers bilaterally. She has been tolerating the dressing changes without complication. Fortunately there does not appear to be any evidence of infection at this time. Overall I feel like she is improving in an excellent manner and I'm very pleased with the fact that everything seems to be turning towards the better for her. This has obviously been a long road. 09/27/18 on evaluation today patient actually appears to be doing very well in regard to her bilateral lateral malleolus ulcers. She has been tolerating the dressing changes without complication. Fortunately there does not appear to be any evidence of infection at this time which is also great news. No fevers, chills, nausea, or vomiting noted at this time. Overall I feel like she is doing excellent with the snap vac on the left malleolus. She had 40 mL of fluid collection over the past week. 10/04/18 on evaluation today patient actually appears to be doing well in regard to her bilateral lateral malleolus ulcers. She continues to tolerate the dressing changes  without complication. One issue that I see is the snap  vac on the left lateral malleolus which appears to have sealed off some fluid underlying this area and has not really allowed it to heal to the degree that I would like to see. For that reason I did suggest at this point we may want to pack a small piece of packing strip into this region to allow it to more effectively wick out fluid. 10/11/18 in general the patient today does not feel that she has been doing very well. She's been a little bit lethargic and subsequently is having bodyaches as well according to what she tells me today. With that being said overall she has been concerned with the fact that something may be worsening although to be honest her wounds really have not been appearing poorly. She does have a new ulcer on her left heel unfortunately. This may be pressure related. Nonetheless it seems to me to have potentially started at least as a blister I do not see any evidence of deep tissue injury. In regard to the left ankle the snap vac still seems to be causing the ceiling off of the deeper part of the wound which is in turn trapping fluid. I'm not extremely pleased with the overall appearance as far as progress from last week to this week therefore I'm gonna discontinue the snap vac at this point. 10/18/18 patient unfortunately this point has not been feeling well for the past several days. She was seen by Grayland Ormond her primary care provider who is a Librarian, academic at [facility] [employer] Subsequently she states that she's been very weak and generally feeling malaise. No fevers, chills, nausea, or vomiting noted at this time. With that being said bloodwork was performed at the PCP office on the 11th of this month which showed a white blood cell count of 10.7. This was repeated today and shows a white blood cell count of 12.4. This does show signs of worsening. Coupled with the fact that she is feeling worse and that her left  ankle wound is not really showing signs of improvement I feel like this is an indication that the osteomyelitis is likely exacerbating not improving. Overall I think we may also want to check her C-reactive protein and sedimentation rate. Actually did call [facility] Fleet office this afternoon while the patient was in the office here with me. Subsequently based on the findings we discussed treatment possibilities and I think that it is appropriate for [place] to go ahead and initiate treatment with doxycycline which I'm going to do. Subsequently he did agree to see about adding a CRP and sedimentation rate to her orders. If that has not already been drawn to where they can run it they will contact the patient she can come back to have that check. They are in agreement with plan as far as the patient and her daughter are concerned. Nonetheless also think we need to get in touch with Dr. Henreitta Leber office to see about getting the patient scheduled with him as soon as possible. 11/08/18 on evaluation today patient presents for follow-up concerning her bilateral foot and ankle ulcers. I did do an extensive review of her chart in epic today. Subsequently she was seen by [name] he did initiate Cefepime IV antibiotic therapy. Subsequently she had some issues with her PICC line this had to be removed because it was coiled and then replaced. Fortunately that was now settled. Unfortunately she has continued have issues with her left heel as well as the issues that she is  experiencing with her bilateral lateral malleolus regions. I do believe however both areas seem to be doing a little bit better on evaluation today which is good news. No fevers, chills, nausea, or vomiting noted at this time. She actually has an angiogram schedule with Dr. dew on this coming Monday, November 11, 2018. Subsequently the patient states that she is feeling much better especially than what she was roughly 2 weeks ago. She actually had to  cancel an appointment because she was feeling so poorly. No fevers, chills, nausea, or vomiting noted at this time. 11/15/18 on evaluation today patient actually is status post having had her angiogram with Dr. dew Monday, four days ago. 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 He had to go and work on several areas of the vasculature fortunately he was able to obtain no more than a 30% residual stenosis throughout post procedure. I reviewed this note today. I think this will definitely help with healing at this time. Fortunately there does not appear to be any signs of infection and I do feel like ratio already has a better appearance to it. 11/22/18 upon evaluation today patient actually appears to be doing very well in regard to her wounds in general. The right lateral malleolus looks excellent the heel looks better in the left lateral malleolus also appears to be doing a little better. With that being said the right second toe actually appears to be open and training we been watching this is been dry and stable but now is open. 12/03/2018 Seen today for follow-up and management of multiple bilateral lower extremity wounds. New pressure injury of the great toe which is closed at this time. Wound of the right distal second toe appears larger today with deep undermining and a pocket of fluid present within the undermining region. Left and right malleolus is wounds are stable today with no signs and symptoms of infection.Denies any needs or concerns during exam today. 12/13/18 on evaluation today patient appears to be doing somewhat better in regard to her left heel ulcer. She also seems to be completely healed in regard to the right lateral malleolus ulcer. The left malleolus ulcer is smaller what unfortunately the wounds which are new over the first and second toes of the right foot are what are most concerning at this point especially the second. Both areas did require  sharp debridement today. 12/20/18 on evaluation today patient's wound actually appears to be doing better in regard to left lateral ankle and her right lateral ankle continues to remain healed. The hill ulcer on the left is improved. She does have improvement noted as well in regard to both toe ulcers. Overall I'm very pleased in this regard. No fevers, chills, nausea, or vomiting noted at this time. [name], [name] ([id number]) 12/23/18 on evaluation today patient is seen after she had her toenails trimmed at the podiatrist office due to issues with her right great toe. There was what appeared to be dark eschar on the surface of the wound which had her in the podiatrist concerned. Nonetheless as I remember that during the last office visit I had utilize silver nitrate of this area I was much less concerned about the situation. Subsequently I was able to clean off much of this tissue without any complication today. This does not appear to show any signs of infection and actually look somewhat better compared to last time post debridement. Her second toe on the right foot  actually had callous over and there did appear still be some fluid underneath this that would require debridement today. 12/27/18 on evaluation today patient actually appears to be showing signs of improvement at all locations. Even the left lateral ankle although this is not quite as great as the other sites. Fortunately there does not appear to be any signs of infection at this time and both of her toes on the right foot seem to be showing signs of improvement which is good news and very pleased in this regard. 01/03/19 on evaluation today patient appears to be doing better for the most part in regard to her wounds in particular. There does not appear to be any evidence of infection at this time which is good news. Fortunately there is no sign of really worsening anywhere except for the right great toe which she does have what  appears to be a bruise/deep tissue injury which is very superficial and already resolving. I'm not sure where this came from I questioned her extensively and she does not recall what may have happened with this. Other than that the patient seems to be doing well even the left lateral ankle ulcer looks good and is getting smaller. 01/10/19 on evaluation today patient appears to be doing well in regard to her left heel wound and both of her toe wounds. Overall I feel like there is definitely improvement here and I'm happy in that regard. With that being said unfortunately she is having issues with the left lateral malleolus ulcer which unfortunately still has a lot of depth to it. This is gonna be a very difficult wound for [place] to be able to truly get to heal. I may want to consider some type of skin substitute to see if this would be of benefit for her. I'll discuss this with her more the next visit most likely. This was something I thought about more at the end of the visit when I was Artie out of the room and the patient had been discharged. 01/17/19 on evaluation today patient appears to be doing very well in regard to her wounds in general. She's been making excellent progress at this time. Fortunately there's no sign of infection at this time either. No fevers, chills, nausea, or vomiting noted at this time. The biggest issue is still her left lateral malleolus where it appears to be doing well and is getting smaller but still shows a small corner where this is deeper and goes down into what appears to be the joint space. Nonetheless this is taking much longer to heal although it still looks better in smaller than previous evaluations. 01/24/19 on evaluation today patient's wounds actually appear to be doing rather well in general overall. She did require some sharp debridement in regard to the right great toe but everything else appears to be doing excellent no debridement was even necessary. No  fevers, chills, nausea, or vomiting noted at this time. 01/31/19 on evaluation today patient actually appears to be doing much better in regard to her left foot wound on the heel as well as the ankle. The right great toe appears to be a little bit worse today this had callous over and trapped a lot of fluid underneath. Fortunately there's no signs of infection at any site which is great news. 02/07/19 on evaluation today patient actually appears to be doing decently well in regard to all of her ulcers at this point. No sharp debridement was required she is a little bit  of hyper granulation in regard to the left lateral ankle as well as the left heel but the hill itself is almost completely healed which is excellent news. Overall been very pleased in this regard. 02/14/19 on evaluation today patient actually appears to be doing very well in regard to her ulcers on the right first toe, left lateral malleolus, and left heel. In fact the heel is almost completely healed at this point. The patient does not show any signs of infection which is good news. Overall very pleased with how things have progressed. 04/18/19 Telehealth Evaluation During the COVID-19 National Emergency: Verbal Consent: Obtained from patient Allergies: reviewed and the active list is current. Medication changes: patient has no current medication changes. COVID-19 Screening: 1. Have you traveled internationally or on a cruise ship in the last 14 dayso No 2. Have you had contact with someone with or under investigation for COVID-19o No 3. Have you had a fever, cough, sore throat, or experiencing shortness of breatho No on evaluation today actually did have a visit with this patient through a telehealth encounter with her home health nurse. Subsequently it was noted that the patient actually appears to be doing okay in regard to her wounds both the right great toe as well as the left lateral malleolus have shown signs of improvement  although this in your theme around the left lateral malleolus there eschar coverings for both locations. The question is whether or not they are actually close and whether or not home health needs to discharge the patient or not. Nonetheless my concern is this point obviously is that without actually seeing her and being able to evaluate this directly I cannot ensure that she is completely healed which is the question that I'm being asked. 04/22/19 on evaluation today patient presents for her first evaluation since last time I saw her which was actually February 14, 2019. I did do a telehealth visit last week in which point it was questionable whether or not she may be healed and had to bring her in today for confirmation. With that being said she does seem to be doing quite well at this point which is good news. There does not appear to be any drainage in the deed I believe her wounds may be healed. Readmission: 09/04/2019 on evaluation today patient appears to be doing unfortunately somewhat more poorly in regard to her left foot ulcer secondary to a wound that began on 08/21/2019 at least when she first noticed this. Fortunately she has not had any evidence of active infection at this time. Systemically. I also do not necessarily see any evidence of infection at the blister/wound site on the first metatarsal head plantar aspect. This almost appears to be something that may have just rubbed inappropriately causing this to breakdown. They did not want a wait too long to come in to be seen as again she had significant issues in the past with wounds that took quite a while to heal in fact it was close to 2 years. Nonetheless this does not appear to be quite that bad but again we do need to remove some of the necrotic tissue from the surface of the wound to tell exactly the extent. She does not appear to have any significant arterial disease at this point and again her last ABIs and TBI's are recorded above  in the alert section her left ABI was 1.27 with a TBI of 0.72 to the right ABI 1.08 with a TBI of 0.39. Other  than this the patient has been doing quite [name], [name] ([id number]) well since I last saw her and that was in May 2020. 09/11/2019 on evaluation today patient appeared to be doing very well with regard to her plantar foot ulcer on the left. In fact this appears to be almost completely healed which is awesome. That is after just 1 week of intervention. With that being said there is no signs of active infection at this time. 09/18/2019 on evaluation today patient actually appears to be doing excellent in fact she is completely healed based on what I am seeing at this point. Fortunately there is no signs of active infection at this time and overall patient is very pleased to hear that this area has healed so quickly. Readmission: 05/13/2021 upon evaluation today this patient presents for reevaluation here in the clinic. This is a wound that actually we previously took care of. She had 1 on the right ankle and the left the left turned out to be be harder due to to heal but nonetheless is doing great at this point as the right that has reopened and it was noted first just several weeks ago with a scab over it and came off in just the past few days. Fortunately there does not appear to be any obvious evidence of significant active infection at this time which is great news. No fevers, chills, nausea, vomiting, or diarrhea. 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 There does not appear to be any signs of this interfering in any way with her wound. She does have swelling we previously had compression socks for her ordered but again it does not look like she wears these on a regular basis by any means. 05/26/2021 upon evaluation today patient appears to be doing well with regard to her wound which is actually showing signs of  excellent improvement. There does not appear to be any signs of active infection which is great news and overall very pleased with where things stand today. No fevers, chills, nausea, vomiting, or diarrhea. 06/02/2021 upon evaluation today patient's wound actually showing signs of excellent improvement. Fortunately there does not appear to be any signs of active infection which is great news. I think the patient is making good progress with regard to her wounds in general. 06/09/2021 upon evaluation today patient appears to be doing excellent in regard to her wounds currently. Fortunately there does not appear to be any signs of active infection which is great news. No fevers, chills, nausea, vomiting, or diarrhea. Overall extremely pleased with where things stand today. I think the patient is making excellent progress. 06/16/2021 upon evaluation today patient appears to be doing well in regard to her wound. This is going require little bit of debridement today and that was discussed with the patient. Otherwise she seems to be doing quite well and I am actually very pleased with where things stand at this point. No fevers, chills, nausea, vomiting, or diarrhea. 06/23/2021 upon evaluation today patient appears to be doing well with regard to her wounds. She has been tolerating the dressing changes without complication. Fortunately there does not appear to be any evidence of infection and she has not had air in her home which she actually lives at an assisted living that got fixed this morning. With that being said because of that her wrap has been extremely hot and bothersome for her over the past week. 06/30/2021 upon evaluation today patient  is actually making excellent progress in regard to her ankle ulcer. She has been tolerating the dressing changes without complication and overall extremely pleased with where things stand there does not appear to be any evidence of active infection which is great  news. No fevers, chills, nausea, vomiting, or diarrhea. 07/07/21 upon evaluation today patients and culture on the right actually appears to be doing quite well. There does not appear to be any signs of infection and overall very pleased with where things stand today. No fevers, chills, nausea, or vomiting noted at this time. 07/14/2021 unfortunately the patient today has some evidence of deep tissue injury and pressure getting to the ankle region. Again I am not exactly sure what is going on here but this is very similar to issues that we have had in the past. I explained to the patient that she needs to be very mindful of exactly what is happening I think sleeping in bed is probably the main issue here although there could be other culprits I am not sure what else would potentially lead to this kind of a problem for her. 07/21/2021 upon evaluation today patient's wound actually showing signs of improvement compared to last week. Fortunately there does not appear to be any signs of active infection which is great news and overall very pleased with where things stand in that regard. With that being said I do believe that she is continuing to show signs of overall of getting better although I think this is still basically about what we were 2 weeks ago due to the worsening and now improvement. 07/28/2021 upon evaluation today patient appears to be doing well with regard to her wound. She does have some slough buildup on the surface of the wound which I would have to manage today. Fortunately there is no sign of active infection at this time. No fevers, chills, nausea, vomiting, or diarrhea. 08/04/2021 upon evaluation today patient appears to be doing about the same in regard to her wound. To be perfectly honest I am beginning to be a little bit concerned about the overall appearance of the wound bed. I do think possibly taking a sample right around the margin of the wound could be beneficial for her as far  as identifying anything such as an inflammatory process or to be honest even a skin tag cancer type process that may be of concern here. Fortunately there does not appear to be any evidence of active infection at this time which is great news she is not having any pain also great news. 08/11/2021 upon evaluation today patient appears to be doing well with regard to her wound. The good news is I did review her biopsy results and it showed some inflammatory mixed findings but nothing that appeared to be malignant which is great news. Overall this is more of a chronic venous stasis type issue which again is more what we have been treating. Nonetheless I just wanted to make sure before going forward that there was not anything more untoward going on at this point. 08/18/2021 upon evaluation today patient appears to be doing well with regard to her ankle ulcer. Fortunately there does not appear to be any signs of active infection at this time which is great overall wound is dramatically improved compared to last week. Since last week I have actually placed her on doxycycline and subsequently this is a good option as far as the findings are concerned at this point. I do believe that the positive  result of MRSA is definitely something that needed to be addressed and the good news is The doxycycline is doing a good job of doing this. the doxycycline is doing that. There does not appear to be any evidence of active infection systemically which is great news. 08/25/2021 upon evaluation today patient appears to be doing well with regard to her wound. I feel like we are finally get back on track as far as healing is concerned I am much happier with the overall appearance today. I do think that she is tolerating the dressing changes without complication which is great news. We have been using Hydrofera Blue which I think is a good option. The good news is she is also doing great in [place], [name] BRION.  ([id number]) regard to her compression sock on the left which is a zipper compression that seems to be doing a great job keeping her edema under good control. 09/01/2021 upon evaluation today patient appears to be doing well with regard to her wound. Infection seems to be under much better control which is great news and very pleased in that regard. Fortunately there does not appear to be any signs of infection currently. 09/08/2021 upon evaluation today patient actually appears to be making good progress in regard to her wound. She has been tolerating the dressing changes without complication. Fortunately there does not appear to be any evidence of active infection at this time which is great news as well. No fevers, chills, nausea, vomiting, or diarrhea. 09/22/2021 upon evaluation today patient appears to be doing well with regard to her wound although is very slow to heal. We have not looked into Apligraf yet I think that is something that we should see about doing. 09/29/2021 upon evaluation today patient's wound is actually showing signs of doing about the same. I am not seeing any evidence of worsening but also no significant evidence of improvement. We did gain approval for the organogenesis products all except for Apligraf as covered by her insurance. With that being said I do think that we can go ahead and proceed with the NuShield if the patient and her family in agreement of the plan I discussed that with him today she does have a 20% coinsurance which we also discussed. 10/06/2021 upon evaluation today patient appears to unfortunately be doing a little bit worse she appears to be infected based on what I am seeing. Fortunately there does not appear to be any signs of active infection at this time which is great news. Unfortunately it does appear to be some evidence of around the wound edge indicated by way of erythema and warmth as well as redness 10/13/2021 upon evaluation today patient  actually appears to be doing excellent in regard to her ankle ulcer compared to what it was. 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 I think were fine to go ahead and apply the NuShield today. 10/20/2021 upon evaluation today patient appears to be doing excellent in fact the NuShield seems to have done all some for her thus far. I am actually very pleased with where we stand and overall I think that she is making great progress. There is no evidence of active infection at this time. Objective Constitutional Well-nourished and well-hydrated in no acute distress. Vitals Time Taken: 10:32 AM, Height:  62 in, Weight: 150 lbs, BMI: 27.4, Temperature: 98.3 F, Pulse: 72 bpm, Respiratory Rate: 18 breaths/min, Blood Pressure: 139/82 mmHg. Respiratory normal breathing without difficulty. Psychiatric this patient is able to make decisions and demonstrates good insight into disease process. Alert and Oriented x 3. pleasant and cooperative. General Notes: Patient's wound bed showed signs of good granulation and epithelization at this point. Fortunately there does not appear to be any evidence of infection systemically which is great and overall I am extremely happy with where we stand today. Integumentary (Hair, Skin) Wound #7 status is Open. Original cause of wound was Gradually Appeared. The date acquired was: 05/12/2021. The wound has been in treatment 22 weeks. The wound is located on the Right,Lateral Malleolus. The wound measures 1cm length x 1.1cm width x 0.2cm depth; 0.864cm^2 area and 0.173cm^3 volume. There is Fat Layer (Subcutaneous Tissue) exposed. There is no tunneling or undermining noted. There is a medium amount of serosanguineous drainage noted. The wound margin is distinct with the outline  attached to the wound base. There is medium (34-66%) red, pink, pale granulation within the wound bed. There is a medium (34-66%) amount of necrotic tissue within the wound bed. Assessment Active Problems ICD-10 Pressure ulcer of right ankle, stage 3 Lymphedema, not elsewhere classified Venous insufficiency (chronic) (peripheral) Presence of cardiac pacemaker Long term (current) use of anticoagulants Ogas, [name] ([id number]) Procedures Wound #7 Pre-procedure diagnosis of Wound #7 is a Pressure Ulcer located on the Right,Lateral Malleolus. A skin graft procedure using a bioengineered skin substitute/cellular or tissue based product was performed by Tommie Sams., PA-C with the following instrument(s): Forceps and Scissors. Nushield was applied and secured with Steri-Strips. 1.6 sq cm of product was utilized and 0 sq cm was wasted. Post Application, adaptic was applied. A Time Out was conducted at 10:40, prior to the start of the procedure. The procedure was tolerated well with a pain level of 0 throughout and a pain level of 0 following the procedure. Post procedure Diagnosis Wound #7: Same as Pre-Procedure . Plan Follow-up Appointments: Return Appointment in 1 week. Bathing/ Shower/ Hygiene: May shower with wound dressing protected with water repellent cover or cast protector. Cellular or Tissue Based Products: Cellular or Tissue Based Product Type: - NuShield #2 Cellular or Tissue Based Product applied to wound bed; including contact layer, fixation with steri-strips, dry gauze and cover dressing. (DO NOT REMOVE). Edema Control - Lymphedema / Segmental Compressive Device / Other: Optional: One layer of unna paste to top of compression wrap (to act as an anchor). - and at toes Elevate, Exercise Daily and Avoid Standing for Long Periods of Time. Elevate legs to the level of the heart and pump ankles as often as possible Elevate leg(s) parallel to the floor when sitting. WOUND  #7: - Malleolus Wound Laterality: Right, Lateral Cleanser: Soap and Water 1 x Per Week/30 Days Discharge Instructions: Gently cleanse wound with antibacterial soap, rinse and pat dry prior to dressing wounds Secondary Dressing: Gauze 1 x Per Week/30 Days Discharge Instructions: As directed: dry, moistened with saline or moistened with Dakins Solution Compression Wrap: Profore Lite LF 3 Multilayer Compression Bandaging System 1 x Per Week/30 Days Discharge Instructions: Apply 3 multi-layer wrap as prescribed. 1. I am good recommend that we going to continue with the wound care measures as before and the patient is in agreement with the plan. This includes the use of the NuShield which I did reapply today no sharp debridement was necessary currently. 2. I am  also can recommend we have the patient continue with the compression wrapping which I think is also doing awesome for her. We will see patient back for reevaluation in 1 week here in the clinic. If anything worsens or changes patient will contact our office for additional recommendations. Electronic Signature(s) Signed: 10/20/2021 12:51:14 PM By: Worthy Keeler PA-C Previous Signature: 10/20/2021 10:48:47 AM Version By: Worthy Keeler PA-C Entered By: Worthy Keeler on 10/20/2021 12:51:14 Critz, [name] ([id number]) -------------------------------------------------------------------------------- SuperBill Details Patient Name: [name] Date of Service: 10/20/2021 Medical Record Number: [id number] Patient Account Number: [id number] Date of Birth/Sex: 22-Jan-1923 (87 y.o. F) Treating RN: Carlene Coria Primary Care Provider: Adrian Prows Other Clinician: Referring Provider: Adrian Prows Treating Provider/Extender: Skipper Cliche in Treatment: 22 Diagnosis Coding ICD-10 Codes Code Description [phone] Pressure ulcer of right ankle, stage 3 I89.0 Lymphedema, not elsewhere classified I87.2 Venous insufficiency  (chronic) (peripheral) Z95.0 Presence of cardiac pacemaker Z79.01 Long term (current) use of anticoagulants Facility Procedures CPT4 Code: 35686168 Description: 37290 - SKIN SUB GRAFT FACE/NK/HF/G Modifier: Quantity: 1 CPT4 Code: Description: ICD-10 Diagnosis Description 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, stage 3 Modifier: Quantity: CPT4 Code: [id number] Description: E0223 NUSHIELD 1.6 SQ CM (CHG PER SQ CM) Modifier: Quantity: 2 Physician Procedures CPT4 Code: 3612244 Description: 97530 - WC PHYS SKIN SUB GRAFT FACE/NK/HF/G Modifier: Quantity: 1 CPT4 Code: Description: ICD-10 Diagnosis Description 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, stage 3 Modifier: Quantity: Electronic Signature(s) Signed: 10/21/2021 8:55:16 AM By: Gretta Cool, BSN, RN, CWS, Kim RN, BSN Previous Signature: 10/20/2021 12:51:24 PM Version By: Worthy Keeler PA-C Previous Signature: 10/20/2021 11:04:57 AM Version By: Carlene Coria RN Entered By: Gretta Cool, BSN, RN, CWS, Kim on 10/21/2021 08:55:16

## 2021-10-26 ENCOUNTER — Encounter: Payer: Medicare Other | Admitting: Internal Medicine

## 2021-10-26 ENCOUNTER — Other Ambulatory Visit: Payer: Self-pay

## 2021-10-26 DIAGNOSIS — L89513 Pressure ulcer of right ankle, stage 3: Secondary | ICD-10-CM | POA: Diagnosis not present

## 2021-11-02 NOTE — Progress Notes (Signed)
[name] ([id number]) Visit Report for 10/26/2021 Chief Complaint Document Details Patient Name: [name]. Date of Service: 10/26/2021 10:00 AM Medical Record Number: [id number] Patient Account Number: [id number] Date of Birth/Sex: 26-May-1923 (87 y.o. F) Treating RN: Cornell Barman Primary Care Provider: Adrian Prows Other Clinician: Referring Provider: Adrian Prows Treating Provider/Extender: Yaakov Guthrie in Treatment: 23 Information Obtained from: Patient Chief Complaint Right foot ulcer Electronic Signature(s) Signed: 11/01/2021 4:44:29 PM By: Gretta Cool BSN, RN, CWS, Kim RN, BSN Signed: 11/02/2021 3:54:58 PM By: Kalman Shan DO Previous Signature: 10/26/2021 11:13:47 AM Version By: Kalman Shan DO Entered By: Gretta Cool BSN, RN, CWS, Kim on 11/01/2021 16:44:29 [name] ([id number]) -------------------------------------------------------------------------------- Cellular or Tissue Based Product Details Patient Name: [name], [name] Date of Service: 10/26/2021 10:00 AM Medical Record Number: [id number] Patient Account Number: [id number] Date of Birth/Sex: 06-05-1923 (87 y.o. F) Treating RN: Cornell Barman Primary Care Provider: Adrian Prows Other Clinician: Referring Provider: Adrian Prows Treating Provider/Extender: Skipper Cliche in Treatment: 23 Cellular or Tissue Based Product Type Wound #7 Right,Lateral Malleolus Applied to: Performed By: Physician Kalman Shan, MD Cellular or Tissue Based Product [place] Type: Level of Consciousness (Pre- Awake and Alert procedure): Pre-procedure Verification/Time Out Yes - 10:55 Taken: Location: trunk / arms / legs Wound Size (sq cm): 1 Product Size (sq cm): 1.6 Waste Size (sq cm): 0.4 Waste Reason: wound size Amount of Product Applied (sq cm): 1.2 Instrument Used: Forceps, Scissors Lot #: [id number] Order #: [id number] Expiration Date: 06/14/2026 Fenestrated:  No Reconstituted: Yes Solution Type: NACL Solution Amount: 1 Lot #: [id number] Solution Expiration Date: 06/04/2023 Secured: Yes Secured With: Steri-Strips Dressing Applied: Yes Primary Dressing: mepitel one Level of Consciousness (Post- Awake and Alert procedure): Post Procedure Diagnosis Same as Pre-procedure Electronic Signature(s) Signed: 11/01/2021 4:44:08 PM By: Gretta Cool, BSN, RN, CWS, Kim RN, BSN Previous Signature: 11/01/2021 4:41:47 PM Version By: Gretta Cool, BSN, RN, CWS, Kim RN, BSN Previous Signature: 10/26/2021 1:51:33 PM Version By: Gretta Cool, BSN, RN, CWS, Kim RN, BSN Entered By: Gretta Cool, BSN, RN, CWS, Kim on 11/01/2021 16:44:08 [name] ([id number]) -------------------------------------------------------------------------------- HPI Details Patient Name: [name] Date of Service: 10/26/2021 10:00 AM Medical Record Number: [id number] Patient Account Number: [id number] Date of Birth/Sex: [birth year] (87 y.o. 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 Dr. Amalia Hailey the podiatrist saw her on 07/20/2017 and sent her to the wound center for possible hyperbaric oxygen therapy. past medical history of peripheral vascular disease, varicose veins, status post appendectomy, basal cell carcinoma excision from the left leg, cholecystectomy, pacemaker placement, right lower extremity angiography done by Dr. dew in March 2017 with placement of a stent. there is also note of a successful ablation of the right small saphenous vein done which was reviewed by ultrasound on 10/24/2016. the patient had  a right small saphenous vein ablation done on 10/20/2016. The patient has never been a smoker. She has been seen by Dr. Corene Cornea dew the vascular surgeon who most recently saw her on 06/15/2017 for evaluation of ongoing problems with right leg swelling. She had a lower extremity arterial duplex examination done(02/13/17) which showed patent distal right superficial femoral artery stent and above-the-knee popliteal stent without evidence of restenosis. The ABI  was more than 1.3 on the right and more than 1.3 on the left. This was consistent with noncompressible arteries due to medial calcification. The right great toe pressure and PPG waveforms are within normal limits and the left great toe pressure and PPG waveforms are decreased. he recommended she continue to wear her compression stockings and continue with elevation. She is scheduled to have a noninvasive arterial study in the near future 08/16/2017 -- had a lower extremity arterial duplex examination done which showed patent distal right superficial femoral artery stent and above-the-knee popliteal stent without evidence of restenosis. The ABI was more than 1.3 on the right and more than 1.3 on the left. This was consistent with noncompressible arteries due to medial calcification. The right great toe pressure and PPG waveforms are within normal limits and the left great toe pressure and PPG waveforms are decreased. the x-ray of the right ankle has not yet been done 08/24/2017 -- had a right ankle x-ray -- IMPRESSION:1. No fracture, bone lesion or evidence of osteomyelitis. 2. Lateral soft tissue swelling with a soft tissue ulcer. she has not yet seen the vascular surgeon for review 08/31/17 on evaluation today patient's wound appears to be showing signs of improvement. She still with her appointment with vascular in order to review her results of her vascular study and then determine if any intervention would be recommended at that time. No  fevers, chills, nausea, or vomiting noted at this time. She has been tolerating the dressing changes without complication. 09/28/17 on evaluation today patient's wound appears to show signs of good improvement in regard to the granulation tissue which is surfacing. There is still a layer of slough covering the wound and the posterior portion is still significantly deeper than the anterior nonetheless there has been some good sign of things moving towards the better. She is going to go back to Dr. dew for reevaluation to ensure her blood flow is still appropriate. That will be before her next evaluation with [place] next week. No fevers, chills, nausea, or vomiting noted at this time. Patient does have some discomfort rated to be a 3-4/10 depending on activity specifically cleansing the wound makes it worse. 10/05/2017 -- the patient was seen by Dr. Lucky Cowboy last week and noninvasive studies showed a normal right ABI with brisk triphasic waveforms consistent with no arterial insufficiency including normal digital pressures. The duplex showed a patent distal right SFA stent and the proximal SFA was also normal. He was pleased with her test and thought she should have enough of perfusion for normal wound healing. He would see her back in 6 months time. 12/21/17 on evaluation today patient appears to be doing fairly well in regard to her right lateral ankle wound. Unfortunately the main issue that she is expansion at this point is that she is having some issues with what appears to be some cellulitis in the right anterior shin. She has also been noting a little bit of uncomfortable feeling especially last night and her ankle area. I'm afraid that she made the developing a little bit of an infection. With that being said I think it is in the early stages. 12/28/17 on evaluation today patient's ankle appears to be doing excellent. She's making good progress at this point the cellulitis seems to have improved after  last week's evaluation. Overall she is having no significant discomfort which is excellent news. She does have an appointment with Dr. dew on March 29, 2018 for reevaluation in regard to the  stent he placed. She seems to have excellent blood flow in the right lower extremity. 01/19/12 on evaluation today patient's wound appears to be doing very well. In fact she does not appear to require debridement at this point, there's no evidence of infection, and overall from the standpoint of the wound she seems to be doing very well. With that being said I believe that it may be time to switch to different dressing away from the [facility] Dressing she tells me she does have a lot going on her friend actually passed away yesterday and she's also having a lot of issues with her husband this obviously is weighing heavy on her as far as your thoughts and concerns today. 01/25/18 on evaluation today patient appears to be doing fairly well in regard to her right lateral malleolus. She has been tolerating the dressing changes without complication. Overall I feel like this is definitely showing signs of improvement as far as how the overall appearance of the wound is there's also evidence of epithelium start to migrate over the granulation tissue. In general I think that she is progressing nicely as far as the wound is concerned. The only concern she really has is whether or not we can switch to every other week visits in order to avoid having as many appointments as her daughters have a difficult time getting her to her appointments as well as the patient's husband to his he is not doing very well at this point. 02/22/18 on evaluation today patient's right lateral malleolus ulcer appears to be doing great. She has been tolerating the dressing changes without complication. Overall you making excellent progress at this time. Patient is having no significant discomfort. [name], [name] ([id number]) 03/15/18 on  evaluation today patient appears to be doing much more poorly in regard to her right lateral ankle ulcer at this point. Unfortunately since have last seen her her husband has passed just a few days ago is obviously weighed heavily on her her daughter also had surgery well she is with her today as usual. There does not appear to be any evidence of infection she does seem to have significant contusion/deep tissue injury to the right lateral malleolus which was not noted previous when I saw her last. It's hard to tell of exactly when this injury occurred although during the time she was spending the night in the hospital this may have been most likely. 03/22/18 on evaluation today patient appears to actually be doing very well in regard to her ulcer. She did unfortunately have a setback which was noted last week however the good news is we seem to be getting back on track and in fact the wound in the core did still have some necrotic tissue which will be addressed at this point today but in general I'm seeing signs that things are on the up and up. She is glad to hear this obviously she's been somewhat concerned that due to the how her wound digressed more recently. 03/29/18 on evaluation today patient appears to be doing fairly well in regard to her right lower extremity lateral malleolus ulcer. She unfortunately does have a new area of pressure injury over the inferior portion where the wound has opened up a little bit larger secondary to the pressure she seems to be getting. She does tell me sometimes when she sleeps at night that it actually hurts and does seem to be pushing on the area little bit more unfortunately. There does not appear to  be any evidence of infection which is good news. She has been tolerating the dressing changes without complication. She also did have some bruising in the left second and third toes due to the fact that she may have bump this or injured it although she has  neuropathy so she does not feel she did move recently that may have been where this came from. Nonetheless there does not appear to be any evidence of infection at this time. 04/12/18 on evaluation today patient's wound on the right lateral ankle actually appears to be doing a little bit better with a lot of necrotic docking tissue centrally loosening up in clearing away. However she does have the beginnings of a deep tissue injury on the left lateral malleolus likely due to the fact we've been trying offload the right as much as we have. I think she may benefit from an assistive soft device to help with offloading and it looks like they're looking at one of the doughnut conditions that wraps around the lower leg to offload which I think will definitely do a good job. With that being said I think we definitely need to address this issue on the left before it becomes a wound. Patient is not having significant pain. 04/19/18 on evaluation today patient appears to be doing excellent in regard to the progress she's made with her right lateral ankle ulcer. The left ankle region which did show evidence of a deep tissue injury seems to be resolving there's little fluid noted underneath and a blister there's nothing open at this point in time overall I feel like this is progressing nicely which is good news. She does not seem to be having significant discomfort at this point which is also good news. 04/25/18-She is here in follow up evaluation for bilateral lateral malleolar ulcers. The right lateral malleolus ulcer with pale subcutaneous tissue exposure, central area of ulcer with tendon/periosteum exposed. The left lateral malleolus ulcer now with central area of nonviable tissue, otherwise deep tissue injury. She is wearing compression wraps to the left lower extremity, she will place the right lower extremity compression wraps on when she gets home. She will be out of town over the weekend and return next  week and follow-up appointment. She completed her doxycycline this morning 05/03/18 on evaluation today patient appears to be doing very well in regard to her right lateral ankle ulcer in general. At least she's showing some signs of improvement in this regard. Unfortunately she has some additional injury to the left lateral malleolus region which appears to be new likely even over the past several days. Again this determination is based on the overall appearance. With that being said the patient is obviously frustrated about this currently. 05/10/18-She is here in follow-up evaluation for bilateral lateral malleolar ulcers. She states she has purchased offloading shoes/boots and they will arrive tomorrow. She was asked to bring them in the office at next week's appointment so her provider is aware of product being utilized. She continues to sleep on right or left side, she has been encouraged to sleep on her back. The right lateral malleolus ulcer is precariously close to peri-osteum; will order xray. The left lateral malleolus ulcer is improved. Will switch back to santyl; she will follow up next week. 05/17/18 on evaluation today patient actually appears to be doing very well in regard to her malleolus her ulcers compared to last time I saw them. She does not seem to have as much in the way  of contusion at this point which is great news. With that being said she does continue to have discomfort and I do believe that she is still continuing to benefit from the offloading/pressure reducing boots that were recommended. I think this is the key to trying to get this to heal up completely. 05/24/18 on evaluation today patient actually appears to be doing worse at this point in time unfortunately compared to her last week's evaluation. She is having really no increased pain which is good news unfortunately she does have more maceration in your theme and noted surrounding the right lateral ankle the left  lateral ankle is not really is erythematous I do not see signs of the overt cellulitis on that side. Unfortunately the wounds do not seem to have shown any signs of improvement since the last evaluation. She also has significant swelling especially on the right compared to previous some of this may be due to infection however also think that she may be served better while she has these wounds by compression wrapping versus continuing to use the Juxta-Lite for the time being. Especially with the amount of drainage that she is experiencing at this point. No fevers, chills, nausea, or vomiting noted at this time. 05/31/18 on evaluation today patient appears to actually be doing better in regard to her right lateral lower extremity ulcer specifically on the malleolus region. She has been tolerating the antibiotic without complication. With that being said she still continues to have issues but a little bit of redness although nothing like she what she was experiencing previous. She still continues to pressure to her ankle area she did get the problem on offloading boots unfortunately she will not wear them she states there too uncomfortable and she can't get in and out of the bed. Nonetheless at this point her wounds seem to be continually getting worse which is not what we want I'm getting somewhat concerned about her progress and how things are going to proceed if we do not intervene in some way shape or form. I therefore had a very lengthy conversation today about offloading yet again and even made a specific suggestion for switching her to a memory foam mattress and even gave the information for a specific one that they could look at getting if it was something that they were interested in considering. She does not want to be considered for a hospital bed air mattress although honestly insurance would not cover it that she does not have any wounds on her trunk. 06/14/18 on evaluation today both wounds  over the bilateral lateral malleolus her ulcers appear to be doing better there's no evidence of pressure injury at this point. She did get the foam mattress for her bed and this does seem to have been extremely beneficial for her in my pinion. Her daughter states that she is having difficulty getting out of bed because of how soft it is. The patient also relates this to be. Nonetheless I do feel like she's actually doing better. Unfortunately right after and around the time she was getting the mattress she also sustained a fall when she got up to go pick up the phone and ended up injuring her right elbow she has [address] sut[address] place. We are not caring for this currently although home health is going to be taking the sutures out shortly. Nonetheless this may be something that we need to evaluate going forward. It depends on how well it has or has not healed in  the end. She also recently saw an orthopedic specialist for an injection in the right shoulder just before her fall unfortunately the fall seems to have worsened her pain. 06/21/18 on evaluation today patient appears to be doing about the same in regard to her lateral malleolus ulcers. Both appear to be just a little bit deeper but again we are clinging away the necrotic and dead tissue which I think is why this is progressing towards a deeper realm as opposed [name], [name]. ([id number]) to improving from my measurement standpoint in that regard. Nonetheless she has been tolerating the dressing changes she absolutely hates the memory foam mattress topper that was obtained for her nonetheless I do believe this is still doing excellent as far as taking care of excess pressure in regard to the lateral malleolus regions. She in fact has no pressure injury that I see whereas in weeks past it was week by week I was constantly seeing new pressure injuries. Overall I think it has been very beneficial for her. 07/03/18; patient arrives in my clinic  today. She has deep punched out areas over her bilateral lateral malleoli. The area on the right has some more depth. We spent a lot of time today talking about pressure relief for these areas. This started when her daughter asked for a prescription for a memory foam mattress. I have never written a prescription for a mattress and I don't think insurances would pay for that on an ordinary bed. In any case he came up that she has foam boots that she refuses to wear. I would suggest going to these before any other offloading issues when she is in bed. They say she is meticulous about offloading this the rest of the day 07/10/18- She is seen in follow-up evaluation for bilateral, lateral malleolus ulcers. There is no improvement in the ulcers. She has purchased and is sleeping on a memory foam mattress/overlay, she has been using the offloading boots nightly over the past week. She has a follow up appointment with vascular medicine at the end of October, in my opinion this follow up should be expedited given her deterioration and suboptimal TBI results. We will order plain film xray of the left ankle as deeper structures are palpable; would consider having MRI, regardless of xray report(s). The ulcers will be treated with iodoflex/iodosorb, she is unable to safely change the dressings daily with santyl. 07/19/18 on evaluation today patient appears to be doing in general visually well in regard to her bilateral lateral malleolus ulcers. She has been tolerating the dressing changes without complication which is good news. With that being said we did have an x-ray performed on 07/12/18 which revealed a slight loosen see in the lateral portion of the distal left fibula which may represent artifact but underline lytic destruction or osteomyelitis could not be excluded. MRI was recommended. With that being said we can see about getting the patient scheduled for an MRI to further evaluate this area. In fact we have  that scheduled currently for August 20 19,019. 07/26/18 on evaluation today patient's wound on the right lateral ankle actually appears to be doing fairly well at this point in my pinion. She has made some good progress currently. With that being said unfortunately in regard to the left lateral ankle ulcer this seems to be a little bit more problematic at this time. In fact as I further evaluated the situation she actually had bone exposed which is the first time that's been the case  in the bone appear to be necrotic. Currently I did review patient's note from Dr. Bunnie Domino office with  [facility] surgery. He stated that ABI was 1.26 on the right and 0.95 on the left with good waveforms. Her perfusion is stable not reduced from previous studies and her digital waveforms were pretty good particularly on the right. His conclusion upon review of the note was that there was not much she could do to improve her perfusion and he felt she was adequate for wound healing. His suggestion was that she continued to see [place] and consider a synthetic skin graft if there was no underlying infection. He plans to see her back in six months or as needed. 08/01/18 on evaluation today patient appears to be doing better in regard to her right lateral ankle ulcer. Her left lateral ankle ulcer is about the same she still has bone involvement in evidence of necrosis. There does not appear to be evidence of infection at this time On the right lateral lower extremity. I have started her on the Augmentin she picked this up and started this yesterday. This is to get her through until she sees infectious disease which is scheduled for 08/12/18. 08/06/18 on evaluation today patient appears to be doing rather well considering my discussion with patient's daughter at the end of last week. The area which was marked where she had erythema seems to be improved and this is good news. With that being said overall the patient seems  to be making good improvement when it comes to the overall appearance of the right lateral ankle ulcer although this has been slow she at least is coming around in this regard. Unfortunately in regard to the left lateral ankle ulcer this is osteomyelitis based on the pathology report as well is bone culture. Nonetheless we are still waiting CT scan. Unfortunately the MRI we originally ordered cannot be performed as the patient is a pacemaker which I had overlooked. Nonetheless we are working on the CT scan approval and scheduling as of now. She did go to the hospital over the weekend and was placed on IV Cefzo for a couple of days. Fortunately this seems to have improved the erythema quite significantly which is good news. There does not appear to be any evidence of worsening infection at this time. She did have some bleeding after the last debridement therefore I did not perform any sharp debridement in regard to left lateral ankle at this point. Patient has been approved for a snap vac for the right lateral ankle. 08/14/18; the patient with wounds over her bilateral lateral malleoli. The area on the right actually looks quite good. Been using a snap back on this area. Healthy granulation and appears to be filling in. Unfortunately the area on the left is really problematic. 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 Also noted to have cellulitis. She saw Dr. Novella Olive of infectious disease today and was put on linezolid. We are able to verify this with her pharmacy. She is completed the Augmentin that she was already on. We've been using Iodoflex to this area 08/23/18 on evaluation today patient's wounds both actually appear to be doing better compared to my prior evaluations. Fortunately she showing signs of good improvement in regard to the overall wound status especially where were using the snap vac on the right. In  regard to left lateral malleolus the wound bed actually  appears to be much cleaner than previously noted. I do not feel any phone directly probed during evaluation today and though there is tendon noted this does not appear to be necrotic it's actually fairly good as far as the overall appearance of the tendon is concerned. In general the wound bed actually appears to be doing significantly better than it was previous. Patient is currently in the care of [name] and I did review that note today. He actually has her on two weeks of linezolid and then following the patient will be on 1-2 months of Keflex. That is the plan currently. She has been on antibiotics therapy as prescribed by myself initially starting on July 30, 2018 and has been on that continuously up to this point. 08/30/18 on evaluation today patient actually appears to be doing much better in regard to her right lateral malleolus ulcer. She has been tolerating the dressing changes specifically the snap vac without complication although she did have some issues with the seal currently. Apparently there was some trouble with getting it to maintain over the past week past Sunday. Nonetheless overall the wound appears better in regard to the right lateral malleolus region. In regard to left lateral malleolus this actually show some signs of additional granulation although there still tendon noted in the base of the wound this appears to be healthy not necrotic in any way whatsoever. We are considering potentially using a snap vac for the left lateral malleolus as well the product wrap from [employer], [place], was present in the clinic today we're going to see this patient I did have her come in with me after obtaining consent from the patient and her daughter in order to look at the wound and see if there's any recommendation one way or another as to whether or not they felt the snapback could be beneficial for the left lateral malleolus region.  But the conclusion was that it might be but that this is definitely a little bit deeper wound than what traditionally would be utilized for a snap vac. 09/06/18 on evaluation today patient actually appears to be doing excellent in my pinion in regard to both ankle ulcers. She has been tolerating the dressing changes without complication which is great news. Specifically we have been using the snap vac. In regard to the right ankle I'm not even sure that this is going to be necessary for today and following as the wound has filled in quite nicely. In regard to the left ankle I do believe [name], [name] ([id number]) that we're seeing excellent epithelialization from the edge as well as granulation in the central portion the tendon is still exposed but there's no evidence of necrotic bone and in general I feel like the patient has made excellent progress even compared to last week with just one week of the snap vac. 09/11/18; this is a patient who has wounds on her bilateral lateral malleoli. Initially both of these were deep stage IV wounds in the setting of chronic arterial insufficiency. She has been revascularized. As I understand think she been using snap vacs to both of these wounds however the area on the right became more superficial and currently she is only using it on the left. Using silver collagen on the right and silver collagen under the back on the left I believe 09/19/18 on evaluation today patient actually appears to be doing very well in regard to her lateral malleolus or ulcers bilaterally. She has been tolerating the dressing  changes without complication. Fortunately there does not appear to be any evidence of infection at this time. Overall I feel like she is improving in an excellent manner and I'm very pleased with the fact that everything seems to be turning towards the better for her. This has obviously been a long road. 09/27/18 on evaluation today patient actually appears  to be doing very well in regard to her bilateral lateral malleolus ulcers. She has been tolerating the dressing changes without complication. Fortunately there does not appear to be any evidence of infection at this time which is also great news. No fevers, chills, nausea, or vomiting noted at this time. Overall I feel like she is doing excellent with the snap vac on the left malleolus. She had 40 mL of fluid collection over the past week. 10/04/18 on evaluation today patient actually appears to be doing well in regard to her bilateral lateral malleolus ulcers. She continues to tolerate the dressing changes without complication. One issue that I see is the snap vac on the left lateral malleolus which appears to have sealed off some fluid underlying this area and has not really allowed it to heal to the degree that I would like to see. For that reason I did suggest at this point we may want to pack a small piece of packing strip into this region to allow it to more effectively wick out fluid. 10/11/18 in general the patient today does not feel that she has been doing very well. She's been a little bit lethargic and subsequently is having bodyaches as well according to what she tells me today. With that being said overall she has been concerned with the fact that something may be worsening although to be honest her wounds really have not been appearing poorly. She does have a new ulcer on her left heel unfortunately. This may be pressure related. Nonetheless it seems to me to have potentially started at least as a blister I do not see any evidence of deep tissue injury. In regard to the left ankle the snap vac still seems to be causing the ceiling off of the deeper part of the wound which is in turn trapping fluid. I'm not extremely pleased with the overall appearance as far as progress from last week to this week therefore I'm gonna discontinue the snap vac at this point. 10/18/18 patient unfortunately  this point has not been feeling well for the past several days. She was seen by Grayland Ormond her primary care provider who is a Librarian, academic at [facility]. Subsequently she states that she's been very weak and generally feeling malaise. No fevers, chills, nausea, or vomiting noted at this time. With that being said bloodwork was performed at the PCP office on the 11th of this month which showed a white blood cell count of 10.7. This was repeated today and shows a white blood cell count of 12.4. This does show signs of worsening. Coupled with the fact that she is feeling worse and that her left ankle wound is not really showing signs of improvement I feel like this is an indication that the osteomyelitis is likely exacerbating not improving. Overall I think we may also want to check her C-reactive protein and sedimentation rate. Actually did call [facility] office this afternoon while the patient was in the office here with me. Subsequently based on the findings we discussed treatment possibilities and I think that it is appropriate for [place] to go ahead and initiate treatment with  doxycycline which I'm going to do. Subsequently he did agree to see about adding a CRP and sedimentation rate to her orders. If that has not already been drawn to where they can run it they will contact the patient she can come back to have that check. They are in agreement with plan as far as the patient and her daughter are concerned. Nonetheless also think we need to get in touch with Dr. Henreitta Leber office to see about getting the patient scheduled with him as soon as possible. 11/08/18 on evaluation today patient presents for follow-up concerning her bilateral foot and ankle ulcers. I did do an extensive review of her chart in epic today. Subsequently she was seen by [name] he did initiate Cefepime IV antibiotic therapy. Subsequently she had some issues with her PICC line this had to be removed because it  was coiled and then replaced. Fortunately that was now settled. Unfortunately she has continued have issues with her left heel as well as the issues that she is experiencing with her bilateral lateral malleolus regions. I do believe however both areas seem to be doing a little bit better on evaluation today which is good news. No fevers, chills, nausea, or vomiting noted at this time. She actually has an angiogram schedule with Dr. dew on this coming Monday, November 11, 2018. Subsequently the patient states that she is feeling much better especially than what she was roughly 2 weeks ago. She actually had to cancel an appointment because she was feeling so poorly. No fevers, chills, nausea, or vomiting noted at this time. 11/15/18 on evaluation today patient actually is status post having had her angiogram with Dr. dew Monday, four days ago. 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 He had to go and work on several areas of the vasculature fortunately he was able to obtain no more than a 30% residual stenosis throughout post procedure. I reviewed this note today. I think this will definitely help with healing at this time. Fortunately there does not appear to be any signs of infection and I do feel like ratio already has a better appearance to it. 11/22/18 upon evaluation today patient actually appears to be doing very well in regard to her wounds in general. The right lateral malleolus looks excellent the heel looks better in the left lateral malleolus also appears to be doing a little better. With that being said the right second toe actually appears to be open and training we been watching this is been dry and stable but now is open. 12/03/2018 Seen today for follow-up and management of multiple bilateral lower extremity wounds. New pressure injury of the great toe which is closed at this time. Wound of the right distal second toe appears larger today with deep undermining  and a pocket of fluid present within the undermining region. Left and right malleolus is wounds are stable today with no signs and symptoms of infection.Denies any needs or concerns during exam today. 12/13/18 on evaluation today patient appears to be doing somewhat better in regard to her left heel ulcer. She also seems to be completely healed in regard to the right lateral malleolus ulcer. The left malleolus ulcer is smaller what unfortunately the wounds which are new over the first and second toes of the right foot are what are most concerning at this point especially the second. Both areas did require sharp debridement today. 12/20/18 on evaluation today patient's wound actually appears to  be doing better in regard to left lateral ankle and her right lateral ankle continues to remain healed. The hill ulcer on the left is improved. She does have improvement noted as well in regard to both toe ulcers. Overall I'm very pleased in this regard. No fevers, chills, nausea, or vomiting noted at this time. 12/23/18 on evaluation today patient is seen after she had her toenails trimmed at the podiatrist office due to issues with her right great toe. There was what appeared to be dark eschar on the surface of the wound which had her in the podiatrist concerned. Nonetheless as I remember that during the last office visit I had utilize silver nitrate of this area I was much less concerned about the situation. Subsequently I was able to clean off much of this tissue without any complication today. This does not appear to show any signs of infection and actually look somewhat better Borenstein, [name] ([id number]) compared to last time post debridement. Her second toe on the right foot actually had callous over and there did appear still be some fluid underneath this that would require debridement today. 12/27/18 on evaluation today patient actually appears to be showing signs of improvement at all locations. Even  the left lateral ankle although this is not quite as great as the other sites. Fortunately there does not appear to be any signs of infection at this time and both of her toes on the right foot seem to be showing signs of improvement which is good news and very pleased in this regard. 01/03/19 on evaluation today patient appears to be doing better for the most part in regard to her wounds in particular. There does not appear to be any evidence of infection at this time which is good news. Fortunately there is no sign of really worsening anywhere except for the right great toe which she does have what appears to be a bruise/deep tissue injury which is very superficial and already resolving. I'm not sure where this came from I questioned her extensively and she does not recall what may have happened with this. Other than that the patient seems to be doing well even the left lateral ankle ulcer looks good and is getting smaller. 01/10/19 on evaluation today patient appears to be doing well in regard to her left heel wound and both of her toe wounds. Overall I feel like there is definitely improvement here and I'm happy in that regard. With that being said unfortunately she is having issues with the left lateral malleolus ulcer which unfortunately still has a lot of depth to it. This is gonna be a very difficult wound for [place] to be able to truly get to heal. I may want to consider some type of skin substitute to see if this would be of benefit for her. I'll discuss this with her more the next visit most likely. This was something I thought about more at the end of the visit when I was Artie out of the room and the patient had been discharged. 01/17/19 on evaluation today patient appears to be doing very well in regard to her wounds in general. She's been making excellent progress at this time. Fortunately there's no sign of infection at this time either. No fevers, chills, nausea, or vomiting noted at this  time. The biggest issue is still her left lateral malleolus where it appears to be doing well and is getting smaller but still shows a small corner where this is deeper and  goes down into what appears to be the joint space. Nonetheless this is taking much longer to heal although it still looks better in smaller than previous evaluations. 01/24/19 on evaluation today patient's wounds actually appear to be doing rather well in general overall. She did require some sharp debridement in regard to the right great toe but everything else appears to be doing excellent no debridement was even necessary. No fevers, chills, nausea, or vomiting noted at this time. 01/31/19 on evaluation today patient actually appears to be doing much better in regard to her left foot wound on the heel as well as the ankle. The right great toe appears to be a little bit worse today this had callous over and trapped a lot of fluid underneath. Fortunately there's no signs of infection at any site which is great news. 02/07/19 on evaluation today patient actually appears to be doing decently well in regard to all of her ulcers at this point. No sharp debridement was required she is a little bit of hyper granulation in regard to the left lateral ankle as well as the left heel but the hill itself is almost completely healed which is excellent news. Overall been very pleased in this regard. 02/14/19 on evaluation today patient actually appears to be doing very well in regard to her ulcers on the right first toe, left lateral malleolus, and left heel. In fact the heel is almost completely healed at this point. The patient does not show any signs of infection which is good news. Overall very pleased with how things have progressed. 04/18/19 Telehealth Evaluation During the COVID-19 National Emergency: Verbal Consent: Obtained from patient Allergies: reviewed and the active list is current. Medication changes: patient has no current  medication changes. COVID-19 Screening: 1. Have you traveled internationally or on a cruise ship in the last 14 dayso No 2. Have you had contact with someone with or under investigation for COVID-19o No 3. Have you had a fever, cough, sore throat, or experiencing shortness of breatho No on evaluation today actually did have a visit with this patient through a telehealth encounter with her home health nurse. Subsequently it was noted that the patient actually appears to be doing okay in regard to her wounds both the right great toe as well as the left lateral malleolus have shown signs of improvement although this in your theme around the left lateral malleolus there eschar coverings for both locations. The question is whether or not they are actually close and whether or not home health needs to discharge the patient or not. Nonetheless my concern is this point obviously is that without actually seeing her and being able to evaluate this directly I cannot ensure that she is completely healed which is the question that I'm being asked. 04/22/19 on evaluation today patient presents for her first evaluation since last time I saw her which was actually February 14, 2019. I did do a telehealth visit last week in which point it was questionable whether or not she may be healed and had to bring her in today for confirmation. With that being said she does seem to be doing quite well at this point which is good news. There does not appear to be any drainage in the deed I believe her wounds may be healed. Readmission: 09/04/2019 on evaluation today patient appears to be doing unfortunately somewhat more poorly in regard to her left foot ulcer secondary to a wound that began on 08/21/2019 at least when she first  noticed this. Fortunately she has not had any evidence of active infection at this time. Systemically. I also do not necessarily see any evidence of infection at the blister/wound site on the first  metatarsal head plantar aspect. This almost appears to be something that may have just rubbed inappropriately causing this to breakdown. They did not want a wait too long to come in to be seen as again she had significant issues in the past with wounds that took quite a while to heal in fact it was close to 2 years. Nonetheless this does not appear to be quite that bad but again we do need to remove some of the necrotic tissue from the surface of the wound to tell exactly the extent. She does not appear to have any significant arterial disease at this point and again her last ABIs and TBI's are recorded above in the alert section her left ABI was 1.27 with a TBI of 0.72 to the right ABI 1.08 with a TBI of 0.39. Other than this the patient has been doing quite well since I last saw her and that was in May 2020. 09/11/2019 on evaluation today patient appeared to be doing very well with regard to her plantar foot ulcer on the left. In fact this appears to be almost completely healed which is awesome. That is after just 1 week of intervention. With that being said there is no signs of active infection at this time. [name], [name] ([id number]) 09/18/2019 on evaluation today patient actually appears to be doing excellent in fact she is completely healed based on what I am seeing at this point. Fortunately there is no signs of active infection at this time and overall patient is very pleased to hear that this area has healed so quickly. Readmission: 05/13/2021 upon evaluation today this patient presents for reevaluation here in the clinic. This is a wound that actually we previously took care of. She had 1 on the right ankle and the left the left turned out to be be harder due to to heal but nonetheless is doing great at this point as the right that has reopened and it was noted first just several weeks ago with a scab over it and came off in just the past few days. Fortunately there does not appear to  be any obvious evidence of significant active infection at this time which is great news. No fevers, chills, nausea, vomiting, or diarrhea. 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 There does not appear to be any signs of this interfering in any way with her wound. She does have swelling we previously had compression socks for her ordered but again it does not look like she wears these on a regular basis by any means. 05/26/2021 upon evaluation today patient appears to be doing well with regard to her wound which is actually showing signs of excellent improvement. There does not appear to be any signs of active infection which is great news and overall very pleased with where things stand today. No fevers, chills, nausea, vomiting, or diarrhea. 06/02/2021 upon evaluation today patient's wound actually showing signs of excellent improvement. Fortunately there does not appear to be any signs of active infection which is great news. I think the patient is making good progress with regard to her wounds in general. 06/09/2021 upon evaluation today patient appears to be doing excellent in regard to her wounds currently. Fortunately there does not appear to  be any signs of active infection which is great news. No fevers, chills, nausea, vomiting, or diarrhea. Overall extremely pleased with where things stand today. I think the patient is making excellent progress. 06/16/2021 upon evaluation today patient appears to be doing well in regard to her wound. This is going require little bit of debridement today and that was discussed with the patient. Otherwise she seems to be doing quite well and I am actually very pleased with where things stand at this point. No fevers, chills, nausea, vomiting, or diarrhea. 06/23/2021 upon evaluation today patient appears to be doing well with regard to her wounds. She has been tolerating the dressing changes without complication.  Fortunately there does not appear to be any evidence of infection and she has not had air in her home which she actually lives at an assisted living that got fixed this morning. With that being said because of that her wrap has been extremely hot and bothersome for her over the past week. 06/30/2021 upon evaluation today patient is actually making excellent progress in regard to her ankle ulcer. She has been tolerating the dressing changes without complication and overall extremely pleased with where things stand there does not appear to be any evidence of active infection which is great news. No fevers, chills, nausea, vomiting, or diarrhea. 07/07/21 upon evaluation today patients and culture on the right actually appears to be doing quite well. There does not appear to be any signs of infection and overall very pleased with where things stand today. No fevers, chills, nausea, or vomiting noted at this time. 07/14/2021 unfortunately the patient today has some evidence of deep tissue injury and pressure getting to the ankle region. Again I am not exactly sure what is going on here but this is very similar to issues that we have had in the past. I explained to the patient that she needs to be very mindful of exactly what is happening I think sleeping in bed is probably the main issue here although there could be other culprits I am not sure what else would potentially lead to this kind of a problem for her. 07/21/2021 upon evaluation today patient's wound actually showing signs of improvement compared to last week. Fortunately there does not appear to be any signs of active infection which is great news and overall very pleased with where things stand in that regard. With that being said I do believe that she is continuing to show signs of overall of getting better although I think this is still basically about what we were 2 weeks ago due to the worsening and now improvement. 07/28/2021 upon evaluation  today patient appears to be doing well with regard to her wound. She does have some slough buildup on the surface of the wound which I would have to manage today. Fortunately there is no sign of active infection at this time. No fevers, chills, nausea, vomiting, or diarrhea. 08/04/2021 upon evaluation today patient appears to be doing about the same in regard to her wound. To be perfectly honest I am beginning to be a little bit concerned about the overall appearance of the wound bed. I do think possibly taking a sample right around the margin of the wound could be beneficial for her as far as identifying anything such as an inflammatory process or to be honest even a skin tag cancer type process that may be of concern here. Fortunately there does not appear to be any evidence of active infection at this  time which is great news she is not having any pain also great news. 08/11/2021 upon evaluation today patient appears to be doing well with regard to her wound. The good news is I did review her biopsy results and it showed some inflammatory mixed findings but nothing that appeared to be malignant which is great news. Overall this is more of a chronic venous stasis type issue which again is more what we have been treating. Nonetheless I just wanted to make sure before going forward that there was not anything more untoward going on at this point. 08/18/2021 upon evaluation today patient appears to be doing well with regard to her ankle ulcer. Fortunately there does not appear to be any signs of active infection at this time which is great overall wound is dramatically improved compared to last week. Since last week I have actually placed her on doxycycline and subsequently this is a good option as far as the findings are concerned at this point. I do believe that the positive result of MRSA is definitely something that needed to be addressed and the good news is The doxycycline is doing a good job of doing  this. the doxycycline is doing that. There does not appear to be any evidence of active infection systemically which is great news. 08/25/2021 upon evaluation today patient appears to be doing well with regard to her wound. I feel like we are finally get back on track as far as healing is concerned I am much happier with the overall appearance today. I do think that she is tolerating the dressing changes without complication which is great news. We have been using Hydrofera Blue which I think is a good option. The good news is she is also doing great in regard to her compression sock on the left which is a zipper compression that seems to be doing a great job keeping her edema under good control. 09/01/2021 upon evaluation today patient appears to be doing well with regard to her wound. Infection seems to be under much better control which is great news and very pleased in that regard. Fortunately there does not appear to be any signs of infection currently. [name], [name] ([id number]) 09/08/2021 upon evaluation today patient actually appears to be making good progress in regard to her wound. She has been tolerating the dressing changes without complication. Fortunately there does not appear to be any evidence of active infection at this time which is great news as well. No fevers, chills, nausea, vomiting, or diarrhea. 09/22/2021 upon evaluation today patient appears to be doing well with regard to her wound although is very slow to heal. We have not looked into Apligraf yet I think that is something that we should see about doing. 09/29/2021 upon evaluation today patient's wound is actually showing signs of doing about the same. I am not seeing any evidence of worsening but also no significant evidence of improvement. We did gain approval for the organogenesis products all except for Apligraf as covered by her insurance. With that being said I do think that we can go ahead and proceed with the  NuShield if the patient and her family in agreement of the plan I discussed that with him today she does have a 20% coinsurance which we also discussed. 10/06/2021 upon evaluation today patient appears to unfortunately be doing a little bit worse she appears to be infected based on what I am seeing. Fortunately there does not appear to be any signs of active infection  at this time which is great news. Unfortunately it does appear to be some evidence of around the wound edge indicated by way of erythema and warmth as well as redness 10/13/2021 upon evaluation today patient actually appears to be doing excellent in regard to her ankle ulcer compared to what it was. 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 I think were fine to go ahead and apply the NuShield today. 10/20/2021 upon evaluation today patient appears to be doing excellent in fact the NuShield seems to have done all some for her thus far. I am actually very pleased with where we stand and overall I think that she is making great progress. There is no evidence of active infection at this time. 11/23; patient presents for follow-up. She has no issues or complaints today. She denies signs of infection. She reports stability in her wound healing. Electronic Signature(s) Signed: 11/01/2021 4:45:05 PM By: Gretta Cool, BSN, RN, CWS, Kim RN, BSN Signed: 11/02/2021 3:54:58 PM By: Kalman Shan DO Previous Signature: 10/26/2021 11:13:47 AM Version By: Kalman Shan DO Entered By: Gretta Cool BSN, RN, CWS, Kim on 11/01/2021 16:45:05 Grabe, [name] ([id number]) -------------------------------------------------------------------------------- Physical Exam Details Patient Name: NEVAEH, KORTE Date of Service: 10/26/2021 10:00 AM Medical Record  Number: [id number] Patient Account Number: [id number] Date of Birth/Sex: [birth year] (87 y.o. F) Treating RN: Cornell Barman Primary Care Provider: Adrian Prows Other Clinician: Referring Provider: Adrian Prows Treating Provider/Extender: Yaakov Guthrie in Treatment: 23 Constitutional . Cardiovascular . Psychiatric . Notes Right foot: To the lateral malleolus there is an open wound with granulation tissue present. No signs of infection. Electronic Signature(s) Signed: 11/01/2021 4:45:41 PM By: Gretta Cool, BSN, RN, CWS, Kim RN, BSN Signed: 11/02/2021 3:54:58 PM By: Kalman Shan DO Previous Signature: 10/26/2021 11:13:47 AM Version By: Kalman Shan DO Entered By: Gretta Cool BSN, RN, CWS, Kim on 11/01/2021 16:45:41 Jeangilles, [name] ([id number]) -------------------------------------------------------------------------------- Physician Orders Details Patient Name: [name], [name] Date of Service: 10/26/2021 10:00 AM Medical Record Number: [id number] Patient Account Number: [id number] Date of Birth/Sex: 12/14/1922 (87 y.o. F) Treating RN: Cornell Barman Primary Care Provider: Adrian Prows Other Clinician: Referring Provider: Adrian Prows Treating Provider/Extender: Yaakov Guthrie in Treatment: 82 Verbal / Phone Orders: No Diagnosis Coding Follow-up Appointments o Return Appointment in 1 week. Bathing/ Shower/ Hygiene o May shower with wound dressing protected with water repellent cover or cast protector. Cellular or Tissue Based Products o Cellular or Tissue Based Product Type: - NuShield #3 o Cellular or Tissue Based Product applied to wound bed; including contact layer, fixation with steri-strips, dry gauze and cover dressing. (DO NOT REMOVE). Edema Control - Lymphedema / Segmental Compressive Device / Other o Optional: One layer of unna paste to top of compression wrap (to act as an anchor). - and at toes o Elevate, Exercise Daily and  Avoid Standing for Long Periods of Time. o Elevate legs to the level of the heart and pump ankles as often as possible o Elevate leg(s) parallel to the floor when sitting. Wound Treatment Wound #7 - Malleolus Wound Laterality: Right, Lateral Cleanser: Soap and Water 1 x Per Week/30 Days Discharge Instructions: Gently cleanse wound with antibacterial soap, rinse and pat dry prior to dressing wounds Secondary Dressing: Gauze 1 x Per Week/30 Days Discharge Instructions: As directed: dry, moistened with saline  or moistened with Dakins Solution Compression Wrap: Profore Lite LF 3 Multilayer Compression Bandaging System 1 x Per Week/30 Days Discharge Instructions: Apply 3 multi-layer wrap as prescribed. Electronic Signature(s) Signed: 11/01/2021 5:03:13 PM By: Gretta Cool, BSN, RN, CWS, Kim RN, BSN Signed: 11/02/2021 3:54:58 PM By: Kalman Shan DO Previous Signature: 10/26/2021 3:24:52 PM Version By: Gretta Cool, BSN, RN, CWS, Kim RN, BSN Entered By: Gretta Cool, BSN, RN, CWS, Kim on 11/01/2021 16:42:37 Civello, [name] ([id number]) -------------------------------------------------------------------------------- Problem List Details Patient Name: RAFAELITA, FOISTER Date of Service: 10/26/2021 10:00 AM Medical Record Number: [id number] Patient Account Number: [id number] Date of Birth/Sex: [birth year] (87 y.o. 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, stage 3 05/13/2021 No Yes I89.0 Lymphedema, not elsewhere classified 05/13/2021 No Yes I87.2 Venous insufficiency (chronic) (peripheral) 05/13/2021 No Yes Z95.0 Presence of cardiac pacemaker 05/13/2021 No Yes Z79.01 Long term (current) use of anticoagulants 05/13/2021 No Yes Inactive  Problems Resolved Problems Electronic Signature(s) Signed: 11/01/2021 4:43:38 PM By: Gretta Cool, BSN, RN, CWS, Kim RN, BSN Signed: 11/02/2021 3:54:58 PM By: Kalman Shan DO Previous Signature: 10/26/2021 11:13:47 AM Version By: Kalman Shan DO Entered By: Gretta Cool BSN, RN, CWS, Kim on 11/01/2021 16:43:38 Penrose, [name] ([id number]) -------------------------------------------------------------------------------- Progress Note Details Patient Name: [name] Date of Service: 10/26/2021 10:00 AM Medical Record Number: [id number] Patient Account Number: [id number] Date of Birth/Sex: [birth year] (87 y.o. 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 Dr. Amalia Hailey the podiatrist saw her on 07/20/2017 and sent her to the wound center for possible hyperbaric oxygen therapy. past medical history of peripheral vascular disease, varicose veins, status post appendectomy, basal cell carcinoma excision from the left leg, cholecystectomy, pacemaker placement, right lower extremity angiography done by Dr. dew in March 2017 with placement of a stent. there is also note of a successful ablation of the right small saphenous vein done which was reviewed by ultrasound on 10/24/2016. the patient had a right small saphenous vein ablation done on 10/20/2016. The patient has never been a smoker. She has been seen by Dr. Corene Cornea dew the vascular surgeon who most recently saw her on 06/15/2017 for evaluation of ongoing problems with right leg swelling. She had  a lower extremity arterial duplex examination done(02/13/17) which showed patent distal right superficial femoral artery stent and above-the-knee popliteal stent without evidence of restenosis. The ABI was more than 1.3 on the right and more than 1.3 on the left. This was consistent with noncompressible arteries due to medial calcification. The right great toe pressure and PPG waveforms are within normal limits and the left great toe pressure and PPG waveforms are decreased. he recommended she continue to wear her compression stockings and continue with elevation. She is scheduled to have a noninvasive arterial study in the near future 08/16/2017 -- had a lower extremity arterial duplex examination done which showed patent distal right superficial femoral artery stent and above-the-knee popliteal stent without evidence of restenosis. The ABI was more than 1.3 on the right and  more than 1.3 on the left. This was consistent with noncompressible arteries due to medial calcification. The right great toe pressure and PPG waveforms are within normal limits and the left great toe pressure and PPG waveforms are decreased. the x-ray of the right ankle has not yet been done 08/24/2017 -- had a right ankle x-ray -- IMPRESSION:1. No fracture, bone lesion or evidence of osteomyelitis. 2. Lateral soft tissue swelling with a soft tissue ulcer. she has not yet seen the vascular surgeon for review 08/31/17 on evaluation today patient's wound appears to be showing signs of improvement. She still with her appointment with vascular in order to review her results of her vascular study and then determine if any intervention would be recommended at that time. No fevers, chills, nausea, or vomiting noted at this time. She has been tolerating the dressing changes without complication. 09/28/17 on evaluation today patient's wound appears to show signs of good improvement in regard to the granulation tissue which is  surfacing. There is still a layer of slough covering the wound and the posterior portion is still significantly deeper than the anterior nonetheless there has been some good sign of things moving towards the better. She is going to go back to Dr. dew for reevaluation to ensure her blood flow is still appropriate. That will be before her next evaluation with [place] next week. No fevers, chills, nausea, or vomiting noted at this time. Patient does have some discomfort rated to be a 3-4/10 depending on activity specifically cleansing the wound makes it worse. 10/05/2017 -- the patient was seen by Dr. Lucky Cowboy last week and noninvasive studies showed a normal right ABI with brisk triphasic waveforms consistent with no arterial insufficiency including normal digital pressures. The duplex showed a patent distal right SFA stent and the proximal SFA was also normal. He was pleased with her test and thought she should have enough of perfusion for normal wound healing. He would see her back in 6 months time. 12/21/17 on evaluation today patient appears to be doing fairly well in regard to her right lateral ankle wound. Unfortunately the main issue that she is expansion at this point is that she is having some issues with what appears to be some cellulitis in the right anterior shin. She has also been noting a little bit of uncomfortable feeling especially last night and her ankle area. I'm afraid that she made the developing a little bit of an infection. With that being said I think it is in the early stages. 12/28/17 on evaluation today patient's ankle appears to be doing excellent. She's making good progress at this point the cellulitis seems to have improved after last week's evaluation. Overall she is having no significant discomfort which is excellent news. She does have an appointment with Dr. dew on March 29, 2018 for reevaluation in regard to the stent he placed. She seems to have excellent blood flow in the right  lower extremity. 01/19/12 on evaluation today patient's wound appears to be doing very well. In fact she does not appear to require debridement at this point, there's no evidence of infection, and overall from the standpoint of the wound she seems to be doing very well. With that being said I believe that it may be time to switch to different dressing away from the [facility] Dressing she tells me she does have a lot going on her friend actually passed away yesterday and she's also having a lot of issues with her husband this obviously  is weighing heavy on her as far as your thoughts and concerns today. 01/25/18 on evaluation today patient appears to be doing fairly well in regard to her right lateral malleolus. She has been tolerating the dressing changes without complication. Overall I feel like this is definitely showing signs of improvement as far as how the overall appearance of the wound is there's also evidence of epithelium start to migrate over the granulation tissue. In general I think that she is progressing nicely as far as the wound is concerned. The only concern she really has is whether or not we can switch to every other week visits in order to avoid having as many [name], [name] ([id number]) appointments as her daughters have a difficult time getting her to her appointments as well as the patient's husband to his he is not doing very well at this point. 02/22/18 on evaluation today patient's right lateral malleolus ulcer appears to be doing great. She has been tolerating the dressing changes without complication. Overall you making excellent progress at this time. Patient is having no significant discomfort. 03/15/18 on evaluation today patient appears to be doing much more poorly in regard to her right lateral ankle ulcer at this point. Unfortunately since have last seen her her husband has passed just a few days ago is obviously weighed heavily on her her daughter also had  surgery well she is with her today as usual. There does not appear to be any evidence of infection she does seem to have significant contusion/deep tissue injury to the right lateral malleolus which was not noted previous when I saw her last. It's hard to tell of exactly when this injury occurred although during the time she was spending the night in the hospital this may have been most likely. 03/22/18 on evaluation today patient appears to actually be doing very well in regard to her ulcer. She did unfortunately have a setback which was noted last week however the good news is we seem to be getting back on track and in fact the wound in the core did still have some necrotic tissue which will be addressed at this point today but in general I'm seeing signs that things are on the up and up. She is glad to hear this obviously she's been somewhat concerned that due to the how her wound digressed more recently. 03/29/18 on evaluation today patient appears to be doing fairly well in regard to her right lower extremity lateral malleolus ulcer. She unfortunately does have a new area of pressure injury over the inferior portion where the wound has opened up a little bit larger secondary to the pressure she seems to be getting. She does tell me sometimes when she sleeps at night that it actually hurts and does seem to be pushing on the area little bit more unfortunately. There does not appear to be any evidence of infection which is good news. She has been tolerating the dressing changes without complication. She also did have some bruising in the left second and third toes due to the fact that she may have bump this or injured it although she has neuropathy so she does not feel she did move recently that may have been where this came from. Nonetheless there does not appear to be any evidence of infection at this time. 04/12/18 on evaluation today patient's wound on the right lateral ankle actually appears to be  doing a little bit better with a lot of necrotic docking tissue centrally loosening up  in clearing away. However she does have the beginnings of a deep tissue injury on the left lateral malleolus likely due to the fact we've been trying offload the right as much as we have. I think she may benefit from an assistive soft device to help with offloading and it looks like they're looking at one of the doughnut conditions that wraps around the lower leg to offload which I think will definitely do a good job. With that being said I think we definitely need to address this issue on the left before it becomes a wound. Patient is not having significant pain. 04/19/18 on evaluation today patient appears to be doing excellent in regard to the progress she's made with her right lateral ankle ulcer. The left ankle region which did show evidence of a deep tissue injury seems to be resolving there's little fluid noted underneath and a blister there's nothing open at this point in time overall I feel like this is progressing nicely which is good news. She does not seem to be having significant discomfort at this point which is also good news. 04/25/18-She is here in follow up evaluation for bilateral lateral malleolar ulcers. The right lateral malleolus ulcer with pale subcutaneous tissue exposure, central area of ulcer with tendon/periosteum exposed. The left lateral malleolus ulcer now with central area of nonviable tissue, otherwise deep tissue injury. She is wearing compression wraps to the left lower extremity, she will place the right lower extremity compression wraps on when she gets home. She will be out of town over the weekend and return next week and follow-up appointment. She completed her doxycycline this morning 05/03/18 on evaluation today patient appears to be doing very well in regard to her right lateral ankle ulcer in general. At least she's showing some signs of improvement in this regard.  Unfortunately she has some additional injury to the left lateral malleolus region which appears to be new likely even over the past several days. Again this determination is based on the overall appearance. With that being said the patient is obviously frustrated about this currently. 05/10/18-She is here in follow-up evaluation for bilateral lateral malleolar ulcers. She states she has purchased offloading shoes/boots and they will arrive tomorrow. She was asked to bring them in the office at next week's appointment so her provider is aware of product being utilized. She continues to sleep on right or left side, she has been encouraged to sleep on her back. The right lateral malleolus ulcer is precariously close to peri-osteum; will order xray. The left lateral malleolus ulcer is improved. Will switch back to santyl; she will follow up next week. 05/17/18 on evaluation today patient actually appears to be doing very well in regard to her malleolus her ulcers compared to last time I saw them. She does not seem to have as much in the way of contusion at this point which is great news. With that being said she does continue to have discomfort and I do believe that she is still continuing to benefit from the offloading/pressure reducing boots that were recommended. I think this is the key to trying to get this to heal up completely. 05/24/18 on evaluation today patient actually appears to be doing worse at this point in time unfortunately compared to her last week's evaluation. She is having really no increased pain which is good news unfortunately she does have more maceration in your theme and noted surrounding the right lateral ankle the left lateral ankle is not really  is erythematous I do not see signs of the overt cellulitis on that side. Unfortunately the wounds do not seem to have shown any signs of improvement since the last evaluation. She also has significant swelling especially on the right  compared to previous some of this may be due to infection however also think that she may be served better while she has these wounds by compression wrapping versus continuing to use the Juxta-Lite for the time being. Especially with the amount of drainage that she is experiencing at this point. No fevers, chills, nausea, or vomiting noted at this time. 05/31/18 on evaluation today patient appears to actually be doing better in regard to her right lateral lower extremity ulcer specifically on the malleolus region. She has been tolerating the antibiotic without complication. With that being said she still continues to have issues but a little bit of redness although nothing like she what she was experiencing previous. She still continues to pressure to her ankle area she did get the problem on offloading boots unfortunately she will not wear them she states there too uncomfortable and she can't get in and out of the bed. Nonetheless at this point her wounds seem to be continually getting worse which is not what we want I'm getting somewhat concerned about her progress and how things are going to proceed if we do not intervene in some way shape or form. I therefore had a very lengthy conversation today about offloading yet again and even made a specific suggestion for switching her to a memory foam mattress and even gave the information for a specific one that they could look at getting if it was something that they were interested in considering. She does not want to be considered for a hospital bed air mattress although honestly insurance would not cover it that she does not have any wounds on her trunk. 06/14/18 on evaluation today both wounds over the bilateral lateral malleolus her ulcers appear to be doing better there's no evidence of pressure injury at this point. She did get the foam mattress for her bed and this does seem to have been extremely beneficial for her in my pinion. Her daughter  states that she is having difficulty getting out of bed because of how soft it is. The patient also relates this to be. Nonetheless I do feel like she's actually doing better. Unfortunately right after and around the time she was getting the mattress she also sustained a fall when she got up to go pick up the phone and ended up injuring her right elbow she has 18 sutures in place. We are not caring for this currently although home health is going to be taking the sutures out shortly. Nonetheless this may be something that we need to evaluate going forward. It depends on [name], [name] ([id number]) how well it has or has not healed in the end. She also recently saw an orthopedic specialist for an injection in the right shoulder just before her fall unfortunately the fall seems to have worsened her pain. 06/21/18 on evaluation today patient appears to be doing about the same in regard to her lateral malleolus ulcers. Both appear to be just a little bit deeper but again we are clinging away the necrotic and dead tissue which I think is why this is progressing towards a deeper realm as opposed to improving from my measurement standpoint in that regard. Nonetheless she has been tolerating the dressing changes she absolutely hates the memory foam mattress  topper that was obtained for her nonetheless I do believe this is still doing excellent as far as taking care of excess pressure in regard to the lateral malleolus regions. She in fact has no pressure injury that I see whereas in weeks past it was week by week I was constantly seeing new pressure injuries. Overall I think it has been very beneficial for her. 07/03/18; patient arrives in my clinic today. She has deep punched out areas over her bilateral lateral malleoli. The area on the right has some more depth. We spent a lot of time today talking about pressure relief for these areas. This started when her daughter asked for a prescription for a  memory foam mattress. I have never written a prescription for a mattress and I don't think insurances would pay for that on an ordinary bed. In any case he came up that she has foam boots that she refuses to wear. I would suggest going to these before any other offloading issues when she is in bed. They say she is meticulous about offloading this the rest of the day 07/10/18- She is seen in follow-up evaluation for bilateral, lateral malleolus ulcers. There is no improvement in the ulcers. She has purchased and is sleeping on a memory foam mattress/overlay, she has been using the offloading boots nightly over the past week. She has a follow up appointment with vascular medicine at the end of October, in my opinion this follow up should be expedited given her deterioration and suboptimal TBI results. We will order plain film xray of the left ankle as deeper structures are palpable; would consider having MRI, regardless of xray report(s). The ulcers will be treated with iodoflex/iodosorb, she is unable to safely change the dressings daily with santyl. 07/19/18 on evaluation today patient appears to be doing in general visually well in regard to her bilateral lateral malleolus ulcers. She has been tolerating the dressing changes without complication which is good news. With that being said we did have an x-ray performed on 07/12/18 which revealed a slight loosen see in the lateral portion of the distal left fibula which may represent artifact but underline lytic destruction or osteomyelitis could not be excluded. MRI was recommended. With that being said we can see about getting the patient scheduled for an MRI to further evaluate this area. In fact we have that scheduled currently for August 20 19,019. 07/26/18 on evaluation today patient's wound on the right lateral ankle actually appears to be doing fairly well at this point in my pinion. She has made some good progress currently. With that being said  unfortunately in regard to the left lateral ankle ulcer this seems to be a little bit more problematic at this time. In fact as I further evaluated the situation she actually had bone exposed which is the first time that's been the case in the bone appear to be necrotic. Currently I did review patient's note from Dr. Bunnie Domino office with [facility]. He stated that ABI was 1.26 on the right and 0.95 on the left with good waveforms. Her perfusion is stable not reduced from previous studies and her digital waveforms were pretty good particularly on the right. His conclusion upon review of the note was that there was not much she could do to improve her perfusion and he felt she was adequate for wound healing. His suggestion was that she continued to see [place] and consider a synthetic skin graft if there was no underlying infection.  He plans to see her back in six months or as needed. 08/01/18 on evaluation today patient appears to be doing better in regard to her right lateral ankle ulcer. Her left lateral ankle ulcer is about the same she still has bone involvement in evidence of necrosis. There does not appear to be evidence of infection at this time On the right lateral lower extremity. I have started her on the Augmentin she picked this up and started this yesterday. This is to get her through until she sees infectious disease which is scheduled for 08/12/18. 08/06/18 on evaluation today patient appears to be doing rather well considering my discussion with patient's daughter at the end of last week. The area which was marked where she had erythema seems to be improved and this is good news. With that being said overall the patient seems to be making good improvement when it comes to the overall appearance of the right lateral ankle ulcer although this has been slow she at least is coming around in this regard. Unfortunately in regard to the left lateral ankle ulcer this is osteomyelitis  based on the pathology report as well is bone culture. Nonetheless we are still waiting CT scan. Unfortunately the MRI we originally ordered cannot be performed as the patient is a pacemaker which I had overlooked. Nonetheless we are working on the CT scan approval and scheduling as of now. She did go to the hospital over the weekend and was placed on IV Cefzo for a couple of days. Fortunately this seems to have improved the erythema quite significantly which is good news. There does not appear to be any evidence of worsening infection at this time. She did have some bleeding after the last debridement therefore I did not perform any sharp debridement in regard to left lateral ankle at this point. Patient has been approved for a snap vac for the right lateral ankle. 08/14/18; the patient with wounds over her bilateral lateral malleoli. The area on the right actually looks quite good. Been using a snap back on this area. Healthy granulation and appears to be filling in. Unfortunately the area on the left is really problematic. 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 Also noted to have cellulitis. She saw Dr. Novella Olive of infectious disease today and was put on linezolid. We are able to verify this with her pharmacy. She is completed the Augmentin that she was already on. We've been using Iodoflex to this area 08/23/18 on evaluation today patient's wounds both actually appear to be doing better compared to my prior evaluations. Fortunately she showing signs of good improvement in regard to the overall wound status especially where were using the snap vac on the right. In regard to left lateral malleolus the wound bed actually appears to be much cleaner than previously noted. I do not feel any phone directly probed during evaluation today and though there is tendon noted this does not appear to be necrotic it's actually  fairly good as far as the overall appearance of the tendon is concerned. In general the wound bed actually appears to be doing significantly better than it was previous. Patient is currently in the care of [name] and I did review that note today. He actually has her on two weeks of linezolid and then following the patient will be on 1-2 months of Keflex. That is the plan currently. She has been on antibiotics  therapy as prescribed by myself initially starting on July 30, 2018 and has been on that continuously up to this point. 08/30/18 on evaluation today patient actually appears to be doing much better in regard to her right lateral malleolus ulcer. She has been tolerating the dressing changes specifically the snap vac without complication although she did have some issues with the seal currently. Apparently there was some trouble with getting it to maintain over the past week past Sunday. Nonetheless overall the wound appears better in regard to the right lateral malleolus region. In regard to left lateral malleolus this actually show some signs of additional granulation although there still tendon noted in the base of the wound this appears to be healthy not necrotic in any way whatsoever. We are considering potentially using a snap vac for the left lateral malleolus as well the product wrap from [employer], [place], was present in the clinic today we're going to see this patient I did have her come in with me after obtaining consent from the patient and her daughter in order to look at the wound and see if there's any recommendation one way or another as to whether or not they felt the snapback could be beneficial for the left lateral malleolus region. But [name], [name] ([id number]) the conclusion was that it might be but that this is definitely a little bit deeper wound than what traditionally would be utilized for a snap vac. 09/06/18 on evaluation today patient actually appears to be doing  excellent in my pinion in regard to both ankle ulcers. She has been tolerating the dressing changes without complication which is great news. Specifically we have been using the snap vac. In regard to the right ankle I'm not even sure that this is going to be necessary for today and following as the wound has filled in quite nicely. In regard to the left ankle I do believe that we're seeing excellent epithelialization from the edge as well as granulation in the central portion the tendon is still exposed but there's no evidence of necrotic bone and in general I feel like the patient has made excellent progress even compared to last week with just one week of the snap vac. 09/11/18; this is a patient who has wounds on her bilateral lateral malleoli. Initially both of these were deep stage IV wounds in the setting of chronic arterial insufficiency. She has been revascularized. As I understand think she been using snap vacs to both of these wounds however the area on the right became more superficial and currently she is only using it on the left. Using silver collagen on the right and silver collagen under the back on the left I believe 09/19/18 on evaluation today patient actually appears to be doing very well in regard to her lateral malleolus or ulcers bilaterally. She has been tolerating the dressing changes without complication. Fortunately there does not appear to be any evidence of infection at this time. Overall I feel like she is improving in an excellent manner and I'm very pleased with the fact that everything seems to be turning towards the better for her. This has obviously been a long road. 09/27/18 on evaluation today patient actually appears to be doing very well in regard to her bilateral lateral malleolus ulcers. She has been tolerating the dressing changes without complication. Fortunately there does not appear to be any evidence of infection at this time which is also great news. No  fevers, chills, nausea, or vomiting noted at  this time. Overall I feel like she is doing excellent with the snap vac on the left malleolus. She had 40 mL of fluid collection over the past week. 10/04/18 on evaluation today patient actually appears to be doing well in regard to her bilateral lateral malleolus ulcers. She continues to tolerate the dressing changes without complication. One issue that I see is the snap vac on the left lateral malleolus which appears to have sealed off some fluid underlying this area and has not really allowed it to heal to the degree that I would like to see. For that reason I did suggest at this point we may want to pack a small piece of packing strip into this region to allow it to more effectively wick out fluid. 10/11/18 in general the patient today does not feel that she has been doing very well. She's been a little bit lethargic and subsequently is having bodyaches as well according to what she tells me today. With that being said overall she has been concerned with the fact that something may be worsening although to be honest her wounds really have not been appearing poorly. She does have a new ulcer on her left heel unfortunately. This may be pressure related. Nonetheless it seems to me to have potentially started at least as a blister I do not see any evidence of deep tissue injury. In regard to the left ankle the snap vac still seems to be causing the ceiling off of the deeper part of the wound which is in turn trapping fluid. I'm not extremely pleased with the overall appearance as far as progress from last week to this week therefore I'm gonna discontinue the snap vac at this point. 10/18/18 patient unfortunately this point has not been feeling well for the past several days. She was seen by Grayland Ormond her primary care provider who is a Librarian, academic at [facility]. Subsequently she states that she's been very weak and generally feeling malaise.  No fevers, chills, nausea, or vomiting noted at this time. With that being said bloodwork was performed at the PCP office on the 11th of this month which showed a white blood cell count of 10.7. This was repeated today and shows a white blood cell count of 12.4. This does show signs of worsening. Coupled with the fact that she is feeling worse and that her left ankle wound is not really showing signs of improvement I feel like this is an indication that the osteomyelitis is likely exacerbating not improving. Overall I think we may also want to check her C-reactive protein and sedimentation rate. Actually did call [facility] office this afternoon while the patient was in the office here with me. Subsequently based on the findings we discussed treatment possibilities and I think that it is appropriate for [place] to go ahead and initiate treatment with doxycycline which I'm going to do. Subsequently he did agree to see about adding a CRP and sedimentation rate to her orders. If that has not already been drawn to where they can run it they will contact the patient she can come back to have that check. They are in agreement with plan as far as the patient and her daughter are concerned. Nonetheless also think we need to get in touch with Dr. Henreitta Leber office to see about getting the patient scheduled with him as soon as possible. 11/08/18 on evaluation today patient presents for follow-up concerning her bilateral foot and ankle ulcers. I did do an extensive  review of her chart in epic today. Subsequently she was seen by [name] he did initiate Cefepime IV antibiotic therapy. Subsequently she had some issues with her PICC line this had to be removed because it was coiled and then replaced. Fortunately that was now settled. Unfortunately she has continued have issues with her left heel as well as the issues that she is experiencing with her bilateral lateral malleolus regions. I do believe however both  areas seem to be doing a little bit better on evaluation today which is good news. No fevers, chills, nausea, or vomiting noted at this time. She actually has an angiogram schedule with Dr. dew on this coming Monday, November 11, 2018. Subsequently the patient states that she is feeling much better especially than what she was roughly 2 weeks ago. She actually had to cancel an appointment because she was feeling so poorly. No fevers, chills, nausea, or vomiting noted at this time. 11/15/18 on evaluation today patient actually is status post having had her angiogram with Dr. dew Monday, four days ago. 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 He had to go and work on several areas of the vasculature fortunately he was able to obtain no more than a 30% residual stenosis throughout post procedure. I reviewed this note today. I think this will definitely help with healing at this time. Fortunately there does not appear to be any signs of infection and I do feel like ratio already has a better appearance to it. 11/22/18 upon evaluation today patient actually appears to be doing very well in regard to her wounds in general. The right lateral malleolus looks excellent the heel looks better in the left lateral malleolus also appears to be doing a little better. With that being said the right second toe actually appears to be open and training we been watching this is been dry and stable but now is open. 12/03/2018 Seen today for follow-up and management of multiple bilateral lower extremity wounds. New pressure injury of the great toe which is closed at this time. Wound of the right distal second toe appears larger today with deep undermining and a pocket of fluid present within the undermining region. Left and right malleolus is wounds are stable today with no signs and symptoms of infection.Denies any needs or concerns during exam today. 12/13/18 on evaluation today patient appears  to be doing somewhat better in regard to her left heel ulcer. She also seems to be completely healed in regard to the right lateral malleolus ulcer. The left malleolus ulcer is smaller what unfortunately the wounds which are new over the first and second toes of the right foot are what are most concerning at this point especially the second. Both areas did require sharp debridement today. 12/20/18 on evaluation today patient's wound actually appears to be doing better in regard to left lateral ankle and her right lateral ankle continues to remain healed. The hill ulcer on the left is improved. She does have improvement noted as well in regard to both toe ulcers. Overall I'm very pleased in this regard. No fevers, chills, nausea, or vomiting noted at this time. [name], [name] ([id number]) 12/23/18 on evaluation today patient is seen after she had her toenails trimmed at the podiatrist office due to issues with her right great toe. There was what appeared to be dark eschar on the surface of the wound which had her in the podiatrist concerned. Nonetheless as I remember  that during the last office visit I had utilize silver nitrate of this area I was much less concerned about the situation. Subsequently I was able to clean off much of this tissue without any complication today. This does not appear to show any signs of infection and actually look somewhat better compared to last time post debridement. Her second toe on the right foot actually had callous over and there did appear still be some fluid underneath this that would require debridement today. 12/27/18 on evaluation today patient actually appears to be showing signs of improvement at all locations. Even the left lateral ankle although this is not quite as great as the other sites. Fortunately there does not appear to be any signs of infection at this time and both of her toes on the right foot seem to be showing signs of improvement which is  good news and very pleased in this regard. 01/03/19 on evaluation today patient appears to be doing better for the most part in regard to her wounds in particular. There does not appear to be any evidence of infection at this time which is good news. Fortunately there is no sign of really worsening anywhere except for the right great toe which she does have what appears to be a bruise/deep tissue injury which is very superficial and already resolving. I'm not sure where this came from I questioned her extensively and she does not recall what may have happened with this. Other than that the patient seems to be doing well even the left lateral ankle ulcer looks good and is getting smaller. 01/10/19 on evaluation today patient appears to be doing well in regard to her left heel wound and both of her toe wounds. Overall I feel like there is definitely improvement here and I'm happy in that regard. With that being said unfortunately she is having issues with the left lateral malleolus ulcer which unfortunately still has a lot of depth to it. This is gonna be a very difficult wound for [place] to be able to truly get to heal. I may want to consider some type of skin substitute to see if this would be of benefit for her. I'll discuss this with her more the next visit most likely. This was something I thought about more at the end of the visit when I was Artie out of the room and the patient had been discharged. 01/17/19 on evaluation today patient appears to be doing very well in regard to her wounds in general. She's been making excellent progress at this time. Fortunately there's no sign of infection at this time either. No fevers, chills, nausea, or vomiting noted at this time. The biggest issue is still her left lateral malleolus where it appears to be doing well and is getting smaller but still shows a small corner where this is deeper and goes down into what appears to be the joint space. Nonetheless this is  taking much longer to heal although it still looks better in smaller than previous evaluations. 01/24/19 on evaluation today patient's wounds actually appear to be doing rather well in general overall. She did require some sharp debridement in regard to the right great toe but everything else appears to be doing excellent no debridement was even necessary. No fevers, chills, nausea, or vomiting noted at this time. 01/31/19 on evaluation today patient actually appears to be doing much better in regard to her left foot wound on the heel as well as the ankle. The right great  toe appears to be a little bit worse today this had callous over and trapped a lot of fluid underneath. Fortunately there's no signs of infection at any site which is great news. 02/07/19 on evaluation today patient actually appears to be doing decently well in regard to all of her ulcers at this point. No sharp debridement was required she is a little bit of hyper granulation in regard to the left lateral ankle as well as the left heel but the hill itself is almost completely healed which is excellent news. Overall been very pleased in this regard. 02/14/19 on evaluation today patient actually appears to be doing very well in regard to her ulcers on the right first toe, left lateral malleolus, and left heel. In fact the heel is almost completely healed at this point. The patient does not show any signs of infection which is good news. Overall very pleased with how things have progressed. 04/18/19 Telehealth Evaluation During the COVID-19 National Emergency: Verbal Consent: Obtained from patient Allergies: reviewed and the active list is current. Medication changes: patient has no current medication changes. COVID-19 Screening: 1. Have you traveled internationally or on a cruise ship in the last 14 dayso No 2. Have you had contact with someone with or under investigation for COVID-19o No 3. Have you had a fever, cough, sore throat,  or experiencing shortness of breatho No on evaluation today actually did have a visit with this patient through a telehealth encounter with her home health nurse. Subsequently it was noted that the patient actually appears to be doing okay in regard to her wounds both the right great toe as well as the left lateral malleolus have shown signs of improvement although this in your theme around the left lateral malleolus there eschar coverings for both locations. The question is whether or not they are actually close and whether or not home health needs to discharge the patient or not. Nonetheless my concern is this point obviously is that without actually seeing her and being able to evaluate this directly I cannot ensure that she is completely healed which is the question that I'm being asked. 04/22/19 on evaluation today patient presents for her first evaluation since last time I saw her which was actually February 14, 2019. I did do a telehealth visit last week in which point it was questionable whether or not she may be healed and had to bring her in today for confirmation. With that being said she does seem to be doing quite well at this point which is good news. There does not appear to be any drainage in the deed I believe her wounds may be healed. Readmission: 09/04/2019 on evaluation today patient appears to be doing unfortunately somewhat more poorly in regard to her left foot ulcer secondary to a wound that began on 08/21/2019 at least when she first noticed this. Fortunately she has not had any evidence of active infection at this time. Systemically. I also do not necessarily see any evidence of infection at the blister/wound site on the first metatarsal head plantar aspect. This almost appears to be something that may have just rubbed inappropriately causing this to breakdown. They did not want a wait too long to come in to be seen as again she had significant issues in the past with wounds that  took quite a while to heal in fact it was close to 2 years. Nonetheless this does not appear to be quite that bad but again we do need to  remove some of the necrotic tissue from the surface of the wound to tell exactly the extent. She does not appear to have any significant arterial disease at this point and again her last ABIs and TBI's are recorded above in the alert section her left ABI was 1.27 with a TBI of 0.72 to the right ABI 1.08 with a TBI of 0.39. Other than this the patient has been doing quite [name], [name] ([id number]) well since I last saw her and that was in May 2020. 09/11/2019 on evaluation today patient appeared to be doing very well with regard to her plantar foot ulcer on the left. In fact this appears to be almost completely healed which is awesome. That is after just 1 week of intervention. With that being said there is no signs of active infection at this time. 09/18/2019 on evaluation today patient actually appears to be doing excellent in fact she is completely healed based on what I am seeing at this point. Fortunately there is no signs of active infection at this time and overall patient is very pleased to hear that this area has healed so quickly. Readmission: 05/13/2021 upon evaluation today this patient presents for reevaluation here in the clinic. This is a wound that actually we previously took care of. She had 1 on the right ankle and the left the left turned out to be be harder due to to heal but nonetheless is doing great at this point as the right that has reopened and it was noted first just several weeks ago with a scab over it and came off in just the past few days. Fortunately there does not appear to be any obvious evidence of significant active infection at this time which is great news. No fevers, chills, nausea, vomiting, or diarrhea. 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 There does not appear to be  any signs of this interfering in any way with her wound. She does have swelling we previously had compression socks for her ordered but again it does not look like she wears these on a regular basis by any means. 05/26/2021 upon evaluation today patient appears to be doing well with regard to her wound which is actually showing signs of excellent improvement. There does not appear to be any signs of active infection which is great news and overall very pleased with where things stand today. No fevers, chills, nausea, vomiting, or diarrhea. 06/02/2021 upon evaluation today patient's wound actually showing signs of excellent improvement. Fortunately there does not appear to be any signs of active infection which is great news. I think the patient is making good progress with regard to her wounds in general. 06/09/2021 upon evaluation today patient appears to be doing excellent in regard to her wounds currently. Fortunately there does not appear to be any signs of active infection which is great news. No fevers, chills, nausea, vomiting, or diarrhea. Overall extremely pleased with where things stand today. I think the patient is making excellent progress. 06/16/2021 upon evaluation today patient appears to be doing well in regard to her wound. This is going require little bit of debridement today and that was discussed with the patient. Otherwise she seems to be doing quite well and I am actually very pleased with where things stand at this point. No fevers, chills, nausea, vomiting, or diarrhea. 06/23/2021 upon evaluation today patient appears to be doing well with regard to her wounds. She has been  tolerating the dressing changes without complication. Fortunately there does not appear to be any evidence of infection and she has not had air in her home which she actually lives at an assisted living that got fixed this morning. With that being said because of that her wrap has been extremely hot and  bothersome for her over the past week. 06/30/2021 upon evaluation today patient is actually making excellent progress in regard to her ankle ulcer. She has been tolerating the dressing changes without complication and overall extremely pleased with where things stand there does not appear to be any evidence of active infection which is great news. No fevers, chills, nausea, vomiting, or diarrhea. 07/07/21 upon evaluation today patients and culture on the right actually appears to be doing quite well. There does not appear to be any signs of infection and overall very pleased with where things stand today. No fevers, chills, nausea, or vomiting noted at this time. 07/14/2021 unfortunately the patient today has some evidence of deep tissue injury and pressure getting to the ankle region. Again I am not exactly sure what is going on here but this is very similar to issues that we have had in the past. I explained to the patient that she needs to be very mindful of exactly what is happening I think sleeping in bed is probably the main issue here although there could be other culprits I am not sure what else would potentially lead to this kind of a problem for her. 07/21/2021 upon evaluation today patient's wound actually showing signs of improvement compared to last week. Fortunately there does not appear to be any signs of active infection which is great news and overall very pleased with where things stand in that regard. With that being said I do believe that she is continuing to show signs of overall of getting better although I think this is still basically about what we were 2 weeks ago due to the worsening and now improvement. 07/28/2021 upon evaluation today patient appears to be doing well with regard to her wound. She does have some slough buildup on the surface of the wound which I would have to manage today. Fortunately there is no sign of active infection at this time. No fevers, chills, nausea,  vomiting, or diarrhea. 08/04/2021 upon evaluation today patient appears to be doing about the same in regard to her wound. To be perfectly honest I am beginning to be a little bit concerned about the overall appearance of the wound bed. I do think possibly taking a sample right around the margin of the wound could be beneficial for her as far as identifying anything such as an inflammatory process or to be honest even a skin tag cancer type process that may be of concern here. Fortunately there does not appear to be any evidence of active infection at this time which is great news she is not having any pain also great news. 08/11/2021 upon evaluation today patient appears to be doing well with regard to her wound. The good news is I did review her biopsy results and it showed some inflammatory mixed findings but nothing that appeared to be malignant which is great news. Overall this is more of a chronic venous stasis type issue which again is more what we have been treating. Nonetheless I just wanted to make sure before going forward that there was not anything more untoward going on at this point. 08/18/2021 upon evaluation today patient appears to be doing well with  regard to her ankle ulcer. Fortunately there does not appear to be any signs of active infection at this time which is great overall wound is dramatically improved compared to last week. Since last week I have actually placed her on doxycycline and subsequently this is a good option as far as the findings are concerned at this point. 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 the doxycycline is doing that. There does not appear to be any evidence of active infection systemically which is great news. 08/25/2021 upon evaluation today patient appears to be doing well with regard to her wound. I feel like we are finally  get back on track as far as healing is concerned I am much happier with the overall appearance today. I do think that she is tolerating the dressing changes without complication which is great news. We have been using Hydrofera Blue which I think is a good option. The good news is she is also doing great in [name], AZELYN BATIE. ([id number]) regard to her compression sock on the left which is a zipper compression that seems to be doing a great job keeping her edema under good control. 09/01/2021 upon evaluation today patient appears to be doing well with regard to her wound. Infection seems to be under much better control which is great news and very pleased in that regard. Fortunately there does not appear to be any signs of infection currently. 09/08/2021 upon evaluation today patient actually appears to be making good progress in regard to her wound. She has been tolerating the dressing changes without complication. Fortunately there does not appear to be any evidence of active infection at this time which is great news as well. No fevers, chills, nausea, vomiting, or diarrhea. 09/22/2021 upon evaluation today patient appears to be doing well with regard to her wound although is very slow to heal. We have not looked into Apligraf yet I think that is something that we should see about doing. 09/29/2021 upon evaluation today patient's wound is actually showing signs of doing about the same. I am not seeing any evidence of worsening but also no significant evidence of improvement. We did gain approval for the organogenesis products all except for Apligraf as covered by her insurance. With that being said I do think that we can go ahead and proceed with the NuShield if the patient and her family in agreement of the plan I discussed that with him today she does have a 20% coinsurance which we also discussed. 10/06/2021 upon evaluation today patient appears to unfortunately be doing a little bit worse she  appears to be infected based on what I am seeing. Fortunately there does not appear to be any signs of active infection at this time which is great news. Unfortunately it does appear to be some evidence of around the wound edge indicated by way of erythema and warmth as well as redness 10/13/2021 upon evaluation today patient actually appears to be doing excellent in regard to her ankle ulcer compared to what it was. 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 I think were fine to go ahead and apply the NuShield today. 10/20/2021 upon evaluation  today patient appears to be doing excellent in fact the NuShield seems to have done all some for her thus far. I am actually very pleased with where we stand and overall I think that she is making great progress. There is no evidence of active infection at this time. 11/23; patient presents for follow-up. She has no issues or complaints today. She denies signs of infection. She reports stability in her wound healing. Patient History Information obtained from Patient. Family History Cancer - Father,Siblings, Heart Disease - Siblings, No family history of Diabetes, Hereditary Spherocytosis, Hypertension, Kidney Disease, Lung Disease, Seizures, Stroke, Thyroid Problems, Tuberculosis. Social History Never smoker, Marital Status - Widowed, Alcohol Use - Never, Drug Use - No History, Caffeine Use - Rarely. Medical History Eyes Patient has history of Cataracts - surgery Denies history of Glaucoma, Optic Neuritis Ear/Nose/Mouth/Throat Denies history of Chronic sinus problems/congestion, Middle ear problems Hematologic/Lymphatic Denies history of Anemia, Hemophilia, Human Immunodeficiency Virus, Lymphedema, Sickle Cell Disease Respiratory Denies history of  Aspiration, Asthma, Chronic Obstructive Pulmonary Disease (COPD), Pneumothorax, Sleep Apnea, Tuberculosis Cardiovascular Patient has history of Congestive Heart Failure, Hypertension, Peripheral Arterial Disease Denies history of Angina, Arrhythmia, Coronary Artery Disease, Deep Vein Thrombosis, Hypotension, Myocardial Infarction, Peripheral Venous Disease, Phlebitis, Vasculitis Gastrointestinal Denies history of Cirrhosis , Colitis, Crohn s, Hepatitis A, Hepatitis B, Hepatitis C Endocrine Denies history of Type I Diabetes, Type II Diabetes Genitourinary Denies history of End Stage Renal Disease Immunological Denies history of Lupus Erythematosus, Raynaud s, Scleroderma Integumentary (Skin) Denies history of History of Burn, History of pressure wounds Musculoskeletal Patient has history of Osteoarthritis Denies history of Gout, Rheumatoid Arthritis, Osteomyelitis Neurologic Patient has history of Neuropathy Denies history of Dementia, Quadriplegia, Paraplegia, Seizure Disorder Oncologic Denies history of Received Chemotherapy, Received Radiation Psychiatric Denies history of Anorexia/bulimia, Confinement Anxiety Germano, [name] ([id number]) Hyperbaric Evaluation Form Objective Constitutional Vitals Time Taken: 10:26 AM, Height: 62 in, Weight: 150 lbs, BMI: 27.4, Temperature: 98.0 F, Pulse: 68 bpm, Respiratory Rate: 18 breaths/min, Blood Pressure: 122/67 mmHg. General Notes: Right foot: To the lateral malleolus there is an open wound with granulation tissue present. No signs of infection. Integumentary (Hair, Skin) Wound #7 status is Open. Original cause of wound was Gradually Appeared. The date acquired was: 05/12/2021. The wound has been in treatment 23 weeks. The wound is located on the Right,Lateral Malleolus. The wound measures 1cm length x 1cm width x 0.2cm depth; 0.785cm^2 area and 0.157cm^3 volume. There is Fat Layer (Subcutaneous Tissue) exposed. There is a medium amount  of serosanguineous drainage noted. The wound margin is distinct with the outline attached to the wound base. There is medium (34-66%) pale granulation within the wound bed. There is a medium (34-66%) amount of necrotic tissue within the wound bed including Adherent Slough. Assessment Active Problems ICD-10 Pressure ulcer of right ankle, stage 3 Lymphedema, not elsewhere classified Venous insufficiency (chronic) (peripheral) Presence of cardiac pacemaker Long term (current) use of anticoagulants Patient's wound is stable. No signs of infection. Nushield #3 was placed in standard fashion today. Follow-up in 1 week. Procedures Wound #7 Pre-procedure diagnosis of Wound #7 is a Pressure Ulcer located on the Right,Lateral Malleolus. A skin graft procedure using a bioengineered skin substitute/cellular or tissue based product was performed by Kalman Shan, MD with the following instrument(s): Forceps and Scissors. Nushield was applied and secured with Steri-Strips. 1.2 sq cm of product was utilized and 0.4 sq cm was wasted due to wound size. Post Application, mepitel one was applied. A Time Out was  conducted at 10:55, prior to the start of the procedure. Post procedure Diagnosis Wound #7: Same as Pre-Procedure . Pre-procedure diagnosis of Wound #7 is a Pressure Ulcer located on the Right,Lateral Malleolus . There was a Three Layer Compression Therapy Procedure by Cornell Barman, RN. Post procedure Diagnosis Wound #7: Same as Pre-Procedure Notes: patient is tolerating wraps well. Plan Follow-up Appointments: Return Appointment in 1 week. [name], [name] ([id number]) Bathing/ Shower/ Hygiene: May shower with wound dressing protected with water repellent cover or cast protector. Cellular or Tissue Based Products: Cellular or Tissue Based Product Type: - NuShield #3 Cellular or Tissue Based Product applied to wound bed; including contact layer, fixation with steri-strips, dry gauze and cover  dressing. (DO NOT REMOVE). Edema Control - Lymphedema / Segmental Compressive Device / Other: Optional: One layer of unna paste to top of compression wrap (to act as an anchor). - and at toes Elevate, Exercise Daily and Avoid Standing for Long Periods of Time. Elevate legs to the level of the heart and pump ankles as often as possible Elevate leg(s) parallel to the floor when sitting. WOUND #7: - Malleolus Wound Laterality: Right, Lateral Cleanser: Soap and Water 1 x Per Week/30 Days Discharge Instructions: Gently cleanse wound with antibacterial soap, rinse and pat dry prior to dressing wounds Secondary Dressing: Gauze 1 x Per Week/30 Days Discharge Instructions: As directed: dry, moistened with saline or moistened with Dakins Solution Compression Wrap: Profore Lite LF 3 Multilayer Compression Bandaging System 1 x Per Week/30 Days Discharge Instructions: Apply 3 multi-layer wrap as prescribed. 1. NuShield #3 was placed in standard fashion 2. Follow-up in 1 week Electronic Signature(s) Signed: 11/01/2021 5:02:47 PM By: Gretta Cool, BSN, RN, CWS, Kim RN, BSN Signed: 11/02/2021 3:54:58 PM By: Kalman Shan DO Previous Signature: 11/01/2021 4:45:58 PM Version By: Gretta Cool BSN, RN, CWS, Kim RN, BSN Previous Signature: 10/26/2021 11:13:47 AM Version By: Kalman Shan DO Entered By: Gretta Cool BSN, RN, CWS, Kim on 11/01/2021 17:02:46 Pietrzyk, [name] ([id number]) -------------------------------------------------------------------------------- ROS/PFSH Details Patient Name: JAIDE, HILLENBURG Date of Service: 10/26/2021 10:00 AM Medical Record Number: [id number] Patient Account Number: [id number] Date of Birth/Sex: 01-Jun-1923 (87 y.o. F) Treating RN: Cornell Barman Primary Care Provider: Adrian Prows Other Clinician: Referring Provider: Adrian Prows Treating Provider/Extender: Yaakov Guthrie in Treatment: 69 Information Obtained From Patient Eyes Medical History: Positive  for: Cataracts - surgery Negative for: Glaucoma; Optic Neuritis Ear/Nose/Mouth/Throat Medical History: Negative for: Chronic sinus problems/congestion; Middle ear problems Hematologic/Lymphatic Medical History: Negative for: Anemia; Hemophilia; Human Immunodeficiency Virus; Lymphedema; Sickle Cell Disease Respiratory Medical History: Negative for: Aspiration; Asthma; Chronic Obstructive Pulmonary Disease (COPD); Pneumothorax; Sleep Apnea; Tuberculosis Cardiovascular Medical History: Positive for: Congestive Heart Failure; Hypertension; Peripheral Arterial Disease Negative for: Angina; Arrhythmia; Coronary Artery Disease; Deep Vein Thrombosis; Hypotension; Myocardial Infarction; Peripheral Venous Disease; Phlebitis; Vasculitis Gastrointestinal Medical History: Negative for: Cirrhosis ; Colitis; Crohnos; Hepatitis A; Hepatitis B; Hepatitis C Endocrine Medical History: Negative for: Type I Diabetes; Type II Diabetes Genitourinary Medical History: Negative for: End Stage Renal Disease Immunological Medical History: Negative for: Lupus Erythematosus; Raynaudos; Scleroderma Integumentary (Skin) Medical History: Negative for: History of Burn; History of pressure wounds Musculoskeletal [place], [name] ([id number]) Medical History: Positive for: Osteoarthritis Negative for: Gout; Rheumatoid Arthritis; Osteomyelitis Neurologic Medical History: Positive for: Neuropathy Negative for: Dementia; Quadriplegia; Paraplegia; Seizure Disorder Oncologic Medical History: Negative for: Received Chemotherapy; Received Radiation Psychiatric Medical History: Negative for: Anorexia/bulimia; Confinement Anxiety HBO Extended History Items Eyes: Cataracts Immunizations Pneumococcal Vaccine: Received Pneumococcal Vaccination: Yes Received Pneumococcal Vaccination  On or After 60th Birthday: No Implantable Devices Yes Family and Social History Cancer: Yes - Father,Siblings; Diabetes: No;  Heart Disease: Yes - Siblings; Hereditary Spherocytosis: No; Hypertension: No; Kidney Disease: No; Lung Disease: No; Seizures: No; Stroke: No; Thyroid Problems: No; Tuberculosis: No; Never smoker; Marital Status - Widowed; Alcohol Use: Never; Drug Use: No History; Caffeine Use: Rarely; Financial Concerns: No; Food, Clothing or Shelter Needs: No; Support System Lacking: No; Transportation Concerns: No Engineer, maintenance) Signed: 11/01/2021 5:03:13 PM By: Gretta Cool, BSN, RN, CWS, Kim RN, BSN Signed: 11/02/2021 3:54:58 PM By: Kalman Shan DO Previous Signature: 10/26/2021 11:13:47 AM Version By: Kalman Shan DO Previous Signature: 10/26/2021 3:24:52 PM Version By: Gretta Cool, BSN, RN, CWS, Kim RN, BSN Entered By: Gretta Cool, BSN, RN, CWS, Kim on 11/01/2021 16:45:19 Cerny, [name] ([id number]) -------------------------------------------------------------------------------- [facility] Details Patient Name: KENTRELL, GUETTLER Date of Service: 10/26/2021 Medical Record Number: [id number] Patient Account Number: [id number] Date of Birth/Sex: 1923-01-26 (87 y.o. F) Treating RN: Cornell Barman Primary Care Provider: Adrian Prows Other Clinician: Referring Provider: Adrian Prows Treating Provider/Extender: Yaakov Guthrie in Treatment: 23 Diagnosis Coding ICD-10 Codes Code Description [phone] Pressure ulcer of right ankle, stage 3 I89.0 Lymphedema, not elsewhere classified I87.2 Venous insufficiency (chronic) (peripheral) Z95.0 Presence of cardiac pacemaker Z79.01 Long term (current) use of anticoagulants Facility Procedures CPT4 Code: [id number] Description: 56861 - SKIN SUB GRAFT FACE/NK/HF/G Modifier: Quantity: 1 CPT4 Code: Description: ICD-10 Diagnosis Description 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, stage 3 I87.2 Venous insufficiency (chronic) (peripheral) Modifier: Quantity: CPT4 Code: [id number] Description: X1155 NUSHIELD 1.6 SQ CM (CHG PER SQ CM) Modifier: Quantity:  2 CPT4 Code: Description: ICD-10 Diagnosis Description 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, stage 3 I87.2 Venous insufficiency (chronic) (peripheral) Modifier: Quantity: Physician Procedures CPT4 Code: 2080223 Description: 36122 - WC PHYS SKIN SUB GRAFT FACE/NK/HF/G Modifier: Quantity: 1 CPT4 Code: Description: ICD-10 Diagnosis Description 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

## 2021-11-02 NOTE — Progress Notes (Signed)
[name], [name] ([id number]) Visit Report for 10/26/2021 Arrival Information Details Patient Name: [name], [name] Date of Service: 10/26/2021 10:00 AM Medical Record Number: [id number] Patient Account Number: [id number] Date of Birth/Sex: [birth year] (87 y.o. F) Treating RN: Cornell Barman Primary Care Sophi Calligan: Adrian Prows Other Clinician: Referring Cadynce Garrette: Adrian Prows Treating Sabien Umland/Extender: Yaakov Guthrie in Treatment: 23 Visit Information History Since Last Visit Added or deleted any medications: No Patient Arrived: Walker Has Dressing in Place as Prescribed: Yes Arrival Time: 10:26 Pain Present Now: No Accompanied By: daughter Transfer Assistance: None Patient Identification Verified: Yes Secondary Verification Process Completed: Yes Patient Requires Transmission-Based No Precautions: Patient Has Alerts: Yes Patient Alerts: Patient on Blood Thinner NOT DIABETIC ***ELIQUIS*** Electronic Signature(s) Signed: 11/01/2021 4:39:42 PM By: Gretta Cool, BSN, RN, CWS, Kim RN, BSN Previous Signature: 10/26/2021 3:24:52 PM Version By: Gretta Cool, BSN, RN, CWS, Kim RN, BSN Entered By: Gretta Cool, BSN, RN, CWS, Kim on 11/01/2021 16:39:41 Shuffler, [name] ([id number]) -------------------------------------------------------------------------------- Clinic Level of Care Assessment Details Patient Name: [name] Date of Service: 10/26/2021 10:00 AM Medical Record Number: [id number] Patient Account Number: [id number] Date of Birth/Sex: 11-30-1923 (87 y.o. F) Treating RN: Cornell Barman Primary Care Crystalle Popwell: Adrian Prows Other Clinician: Referring Khalidah Herbold: Adrian Prows Treating Dain Laseter/Extender: Yaakov Guthrie in Treatment: 23 Clinic Level of Care Assessment Items TOOL 1 Quantity Score []  - Use when EandM and Procedure is performed on INITIAL visit 0 ASSESSMENTS - Nursing Assessment / Reassessment []  - General Physical Exam (combine w/  comprehensive assessment (listed just below) when performed on new 0 pt. evals) []  - 0 Comprehensive Assessment (HX, ROS, Risk Assessments, Wounds Hx, etc.) ASSESSMENTS - Wound and Skin Assessment / Reassessment []  - Dermatologic / Skin Assessment (not related to wound area) 0 ASSESSMENTS - Ostomy and/or Continence Assessment and Care []  - Incontinence Assessment and Management 0 []  - 0 Ostomy Care Assessment and Management (repouching, etc.) PROCESS - Coordination of Care []  - Simple Patient / Family Education for ongoing care 0 []  - 0 Complex (extensive) Patient / Family Education for ongoing care []  - 0 Staff obtains Programmer, systems, Records, Test Results / Process Orders []  - 0 Staff telephones HHA, Nursing Homes / Clarify orders / etc []  - 0 Routine Transfer to another Facility (non-emergent condition) []  - 0 Routine Hospital Admission (non-emergent condition) []  - 0 New Admissions / Biomedical engineer / Ordering NPWT, Apligraf, etc. []  - 0 Emergency Hospital Admission (emergent condition) PROCESS - Special Needs []  - Pediatric / Minor Patient Management 0 []  - 0 Isolation Patient Management []  - 0 Hearing / Language / Visual special needs []  - 0 Assessment of Community assistance (transportation, D/C planning, etc.) []  - 0 Additional assistance / Altered mentation []  - 0 Support Surface(s) Assessment (bed, cushion, seat, etc.) INTERVENTIONS - Miscellaneous []  - External ear exam 0 []  - 0 Patient Transfer (multiple staff / Civil Service fast streamer / Similar devices) []  - 0 Simple Staple / Suture removal (25 or less) []  - 0 Complex Staple / Suture removal (26 or more) []  - 0 Hypo/Hyperglycemic Management (do not check if billed separately) []  - 0 Ankle / Brachial Index (ABI) - do not check if billed separately Has the patient been seen at the hospital within the last three years: Yes Total Score: 0 Level Of Care: ____ [name] ([id number]) Electronic  Signature(s) Signed: 11/01/2021 5:03:13 PM By: Gretta Cool, BSN, RN, CWS, Kim RN, BSN Entered By: Gretta Cool, BSN, RN, CWS, Kim on 11/01/2021 16:42:49 Maclaughlin, [name] ([id number]) --------------------------------------------------------------------------------  Compression Therapy Details Patient Name: [name], [name]. Date of Service: 10/26/2021 10:00 AM Medical Record Number: [id number] Patient Account Number: [id number] Date of Birth/Sex: Nov 29, 1923 (87 y.o. F) Treating RN: Cornell Barman Primary Care Naylene Foell: Adrian Prows Other Clinician: Referring Tariyah Pendry: Adrian Prows Treating Tashaun Obey/Extender: Skipper Cliche in Treatment: 23 Compression Therapy Performed for Wound Assessment: Wound #7 Right,Lateral Malleolus Performed By: Charlesetta Garibaldi, RN Compression Type: Three Layer Post Procedure Diagnosis Same as Pre-procedure Notes patient is tolerating wraps well Electronic Signature(s) Signed: 10/26/2021 3:24:52 PM By: Gretta Cool, BSN, RN, CWS, Kim RN, BSN Entered By: Gretta Cool, BSN, RN, CWS, Kim on 10/26/2021 11:02:36 Garciaperez, [name] ([id number]) -------------------------------------------------------------------------------- Encounter Discharge Information Details Patient Name: [name] Date of Service: 10/26/2021 10:00 AM Medical Record Number: [id number] Patient Account Number: [id number] Date of Birth/Sex: [birth year] (87 y.o. F) Treating RN: Cornell Barman Primary Care Cutter Passey: Adrian Prows Other Clinician: Referring Telesia Ates: Adrian Prows Treating Crockett Rallo/Extender: Yaakov Guthrie in Treatment: 48 Encounter Discharge Information Items Post Procedure Vitals Discharge Condition: Stable Temperature (F): 98.0 Ambulatory Status: Walker Pulse (bpm): 68 Discharge Destination: Home Respiratory Rate (breaths/min): 16 Transportation: Private Auto Blood Pressure (mmHg): 122/67 Accompanied By: daughter Schedule Follow-up Appointment: Yes Clinical  Summary of Care: Electronic Signature(s) Signed: 11/01/2021 4:48:22 PM By: Gretta Cool, BSN, RN, CWS, Kim RN, BSN Previous Signature: 10/26/2021 3:24:52 PM Version By: Gretta Cool, BSN, RN, CWS, Kim RN, BSN Entered By: Gretta Cool, BSN, RN, CWS, Kim on 11/01/2021 16:48:22 Vowles, [name] ([id number]) -------------------------------------------------------------------------------- Lower Extremity Assessment Details Patient Name: [name], [name] Date of Service: 10/26/2021 10:00 AM Medical Record Number: [id number] Patient Account Number: [id number] Date of Birth/Sex: 04-Mar-1923 (87 y.o. F) Treating RN: Cornell Barman Primary Care Daivik Overley: Adrian Prows Other Clinician: Referring Braiden Presutti: Adrian Prows Treating Elior Robinette/Extender: Yaakov Guthrie in Treatment: 23 Edema Assessment Assessed: [Left: No] Patrice Paradise: No] [Left: Edema] [Right: :] Calf Left: Right: Point of Measurement: 33 cm From Medial Instep 27 cm Ankle Left: Right: Point of Measurement: 10 cm From Medial Instep 17 cm Vascular Assessment Pulses: Dorsalis Pedis Palpable: [Right:Yes] Electronic Signature(s) Signed: 11/01/2021 4:40:39 PM By: Gretta Cool, BSN, RN, CWS, Kim RN, BSN Previous Signature: 10/26/2021 3:24:52 PM Version By: Gretta Cool, BSN, RN, CWS, Kim RN, BSN Entered By: Gretta Cool, BSN, RN, CWS, Kim on 11/01/2021 16:40:39 Hartis, [name] ([id number]) -------------------------------------------------------------------------------- Multi Wound Chart Details Patient Name: [name] Date of Service: 10/26/2021 10:00 AM Medical Record Number: [id number] Patient Account Number: [id number] Date of Birth/Sex: March 31, 1923 (87 y.o. F) Treating RN: Cornell Barman Primary Care Deannie Resetar: Adrian Prows Other Clinician: Referring Kamaree Berkel: Adrian Prows Treating Creta Dorame/Extender: Yaakov Guthrie in Treatment: 23 Vital Signs Height(in): 62 Pulse(bpm): 16 Weight(lbs): 150 Blood Pressure(mmHg): 122/67 Body Mass  Index(BMI): 27 Temperature(F): 98.0 Respiratory Rate(breaths/min): 18 Photos: [N/A:N/A] Wound Location: Right, Lateral Malleolus N/A N/A Wounding Event: Gradually Appeared N/A N/A Primary Etiology: Pressure Ulcer N/A N/A Comorbid History: Cataracts, Congestive Heart Failure, N/A N/A Hypertension, Peripheral Arterial Disease, Osteoarthritis, Neuropathy Date Acquired: 05/12/2021 N/A N/A Weeks of Treatment: 23 N/A N/A Wound Status: Open N/A N/A Measurements L x W x D (cm) 1x1x0.2 N/A N/A Area (cm) : 0.785 N/A N/A Volume (cm) : 0.157 N/A N/A % Reduction in Area: 30.10% N/A N/A % Reduction in Volume: 30.20% N/A N/A Classification: Category/Stage III N/A N/A Exudate Amount: Medium N/A N/A Exudate Type: Serosanguineous N/A N/A Exudate Color: red, brown N/A N/A Wound Margin: Distinct, outline attached N/A N/A Granulation Amount: Medium (34-66%) N/A N/A Granulation Quality: Pale N/A N/A Necrotic Amount: Medium (34-66%) N/A N/A  Exposed Structures: Fat Layer (Subcutaneous Tissue): N/A N/A Yes Fascia: No Tendon: No Muscle: No Joint: No Bone: No Epithelialization: Small (1-33%) N/A N/A Procedures Performed: Cellular or Tissue Based Product N/A N/A Compression Therapy Treatment Notes Wound #7 (Malleolus) Wound Laterality: Right, Lateral Cleanser Soap and Water Discharge Instruction: Gently cleanse wound with antibacterial soap, rinse and pat dry prior to dressing wounds Gauthier, [name] ([id number]) Peri-Wound Care Topical Primary Dressing Secondary Dressing Gauze Discharge Instruction: As directed: dry, moistened with saline or moistened with Dakins Solution Secured With Compression Wrap Profore Lite LF 3 Multilayer Compression Bandaging System Discharge Instruction: Apply 3 multi-layer wrap as prescribed. Compression Stockings Environmental education officer) Signed: 11/01/2021 4:43:48 PM By: Gretta Cool, BSN, RN, CWS, Kim RN, BSN Previous Signature: 11/01/2021 4:41:16 PM  Version By: Gretta Cool, BSN, RN, CWS, Kim RN, BSN Previous Signature: 10/26/2021 11:13:47 AM Version By: Kalman Shan DO Entered By: Gretta Cool BSN, RN, CWS, Kim on 11/01/2021 16:43:48 Ferdig, [name] ([id number]) -------------------------------------------------------------------------------- [place] Details Patient Name: [name], [name] Date of Service: 10/26/2021 10:00 AM Medical Record Number: [id number] Patient Account Number: [id number] Date of Birth/Sex: [birth year] (87 y.o. F) Treating RN: Cornell Barman Primary Care Aaliyha Mumford: Adrian Prows Other Clinician: Referring Rayven Hendrickson: Adrian Prows Treating Corlette Ciano/Extender: Yaakov Guthrie in Treatment: 9 Active Inactive Electronic Signature(s) Signed: 11/01/2021 4:40:53 PM By: Gretta Cool, BSN, RN, CWS, Kim RN, BSN Entered By: Gretta Cool, BSN, RN, CWS, Kim on 11/01/2021 16:40:53 Roback, [name] ([id number]) -------------------------------------------------------------------------------- Pain Assessment Details Patient Name: [name] Date of Service: 10/26/2021 10:00 AM Medical Record Number: [id number] Patient Account Number: [id number] Date of Birth/Sex: 04-20-1923 (87 y.o. F) Treating RN: Cornell Barman Primary Care Christiane Sistare: Adrian Prows Other Clinician: Referring Bambie Pizzolato: Adrian Prows Treating Damiano Stamper/Extender: Yaakov Guthrie in Treatment: 23 Active Problems Location of Pain Severity and Description of Pain Patient Has Paino No Site Locations Pain Management and Medication Current Pain Management: Notes Patient denies pain at this time. Electronic Signature(s) Signed: 11/01/2021 4:40:12 PM By: Gretta Cool, BSN, RN, CWS, Kim RN, BSN Previous Signature: 10/26/2021 3:24:52 PM Version By: Gretta Cool, BSN, RN, CWS, Kim RN, BSN Entered By: Gretta Cool, BSN, RN, CWS, Kim on 11/01/2021 16:40:12 Weedon, [name]  ([id number]) -------------------------------------------------------------------------------- Patient/Caregiver Education Details Patient Name: [name], [name] Date of Service: 10/26/2021 10:00 AM Medical Record Number: [id number] Patient Account Number: [id number] Date of Birth/Gender: 1923/07/21 (87 y.o. F) Treating RN: Cornell Barman Primary Care Physician: Adrian Prows Other Clinician: Referring Physician: Adrian Prows Treating Physician/Extender: Yaakov Guthrie in Treatment: 78 Education Assessment Education Provided To: Patient Education Topics Provided Wound/Skin Impairment: Handouts: Caring for Your Ulcer Methods: Demonstration, Explain/Verbal Responses: State content correctly Electronic Signature(s) Signed: 11/01/2021 5:03:13 PM By: Gretta Cool, BSN, RN, CWS, Kim RN, BSN Previous Signature: 10/26/2021 3:24:52 PM Version By: Gretta Cool, BSN, RN, CWS, Kim RN, BSN Entered By: Gretta Cool, BSN, RN, CWS, Kim on 11/01/2021 16:43:18 Canova, [name] ([id number]) -------------------------------------------------------------------------------- Wound Assessment Details Patient Name: [name] Date of Service: 10/26/2021 10:00 AM Medical Record Number: [id number] Patient Account Number: [id number] Date of Birth/Sex: 1923-02-06 (87 y.o. F) Treating RN: Cornell Barman Primary Care Dewana Ammirati: Adrian Prows Other Clinician: Referring Tomy Khim: Adrian Prows Treating Kaye Luoma/Extender: Skipper Cliche in Treatment: 23 Wound Status Wound Number: 7 Primary Pressure Ulcer Etiology: Wound Location: Right, Lateral Malleolus Wound Open Wounding Event: Gradually Appeared Status: Date Acquired: 05/12/2021 Comorbid Cataracts, Congestive Heart Failure, Hypertension, Weeks Of Treatment: 23 History: Peripheral Arterial Disease, Osteoarthritis, Neuropathy Clustered Wound: No Photos Wound Measurements Length: (cm) 1 Width: (cm) 1 Depth: (cm)  0.2 Area: (cm) 0.785 Volume:  (cm) 0.157 % Reduction in Area: 30.1% % Reduction in Volume: 30.2% Epithelialization: Small (1-33%) Wound Description Classification: Category/Stage III Wound Margin: Distinct, outline attached Exudate Amount: Medium Exudate Type: Serosanguineous Exudate Color: red, brown Foul Odor After Cleansing: No Slough/Fibrino Yes Wound Bed Granulation Amount: Medium (34-66%) Exposed Structure Granulation Quality: Pale Fascia Exposed: No Necrotic Amount: Medium (34-66%) Fat Layer (Subcutaneous Tissue) Exposed: Yes Necrotic Quality: Adherent Slough Tendon Exposed: No Muscle Exposed: No Joint Exposed: No Bone Exposed: No Treatment Notes Wound #7 (Malleolus) Wound Laterality: Right, Lateral Cleanser Soap and Water Discharge Instruction: Gently cleanse wound with antibacterial soap, rinse and pat dry prior to dressing wounds Peri-Wound Care Hass, [name] ([id number]) Topical Primary Dressing Secondary Dressing Gauze Discharge Instruction: As directed: dry, moistened with saline or moistened with Dakins Solution Secured With Compression Wrap Profore Lite LF 3 Multilayer Compression Bandaging System Discharge Instruction: Apply 3 multi-layer wrap as prescribed. Compression Stockings Environmental education officer) Signed: 10/26/2021 3:24:52 PM By: Gretta Cool, BSN, RN, CWS, Kim RN, BSN Entered By: Gretta Cool, BSN, RN, CWS, Kim on 10/26/2021 10:39:03 Chaloux, [name] ([id number]) -------------------------------------------------------------------------------- [place] Details Patient Name: [name] Date of Service: 10/26/2021 10:00 AM Medical Record Number: [id number] Patient Account Number: [id number] Date of Birth/Sex: [birth year] (87 y.o. F) Treating RN: Cornell Barman Primary Care Feleica Fulmore: Adrian Prows Other Clinician: Referring Maheen Cwikla: Adrian Prows Treating Yandell Mcjunkins/Extender: Yaakov Guthrie in Treatment: 23 Vital Signs Time Taken: 10:26 Temperature  (F): 98.0 Height (in): 62 Pulse (bpm): 68 Weight (lbs): 150 Respiratory Rate (breaths/min): 18 Body Mass Index (BMI): 27.4 Blood Pressure (mmHg): 122/67 Reference Range: 80 - 120 mg / dl Electronic Signature(s) Signed: 11/01/2021 4:39:57 PM By: Gretta Cool, BSN, RN, CWS, Kim RN, BSN Previous Signature: 10/26/2021 3:24:52 PM Version By: Gretta Cool, BSN, RN, CWS, Kim RN, BSN Entered By: Gretta Cool, BSN, RN, CWS, Kim on 11/01/2021 16:39:56

## 2021-11-03 ENCOUNTER — Encounter: Payer: Medicare Other | Attending: Physician Assistant | Admitting: Physician Assistant

## 2021-11-03 ENCOUNTER — Other Ambulatory Visit: Payer: Self-pay

## 2021-11-03 DIAGNOSIS — G629 Polyneuropathy, unspecified: Secondary | ICD-10-CM | POA: Insufficient documentation

## 2021-11-03 DIAGNOSIS — I739 Peripheral vascular disease, unspecified: Secondary | ICD-10-CM | POA: Insufficient documentation

## 2021-11-03 DIAGNOSIS — L89513 Pressure ulcer of right ankle, stage 3: Secondary | ICD-10-CM | POA: Insufficient documentation

## 2021-11-03 DIAGNOSIS — I771 Stricture of artery: Secondary | ICD-10-CM | POA: Insufficient documentation

## 2021-11-03 DIAGNOSIS — I890 Lymphedema, not elsewhere classified: Secondary | ICD-10-CM | POA: Diagnosis not present

## 2021-11-03 DIAGNOSIS — I872 Venous insufficiency (chronic) (peripheral): Secondary | ICD-10-CM | POA: Diagnosis not present

## 2021-11-03 DIAGNOSIS — I10 Essential (primary) hypertension: Secondary | ICD-10-CM | POA: Insufficient documentation

## 2021-11-03 NOTE — Progress Notes (Addendum)
MAILYN, STEICHEN ([id number]) Visit Report for 11/03/2021 Arrival Information Details Patient Name: [name], [name] Date of Service: 11/03/2021 10:15 AM Medical Record Number: [id number] Patient Account Number: [id number] Date of Birth/Sex: [birth year] (87 y.o. F) Treating RN: Carlene Coria Primary Care [place] Sarinana: Adrian Prows Other Clinician: Referring Blandina Renaldo: Adrian Prows Treating Darly Massi/Extender: Skipper Cliche in Treatment: 24 Visit Information History Since Last Visit All ordered tests and consults were completed: No Patient Arrived: [name] Added or deleted any medications: No Arrival Time: 10:39 Any new allergies or adverse reactions: No Accompanied By: daughter Had a fall or experienced change in No Transfer Assistance: None activities of daily living that may affect Patient Identification Verified: Yes risk of falls: Secondary Verification Process Completed: Yes Signs or symptoms of abuse/neglect since last visito No Patient Requires Transmission-Based No Hospitalized since last visit: No Precautions: Implantable device outside of the clinic excluding No Patient Has Alerts: Yes cellular tissue based products placed in the center Patient Alerts: Patient on Blood since last visit: Thinner Has Dressing in Place as Prescribed: Yes NOT DIABETIC Has Compression in Place as Prescribed: Yes ***ELIQUIS*** Pain Present Now: No Electronic Signature(s) Signed: 11/03/2021 4:59:09 PM By: Carlene Coria RN Entered By: Carlene Coria on 11/03/2021 10:40:35 Hereford, [name] ([id number]) -------------------------------------------------------------------------------- Clinic Level of Care Assessment Details Patient Name: [name] Date of Service: 11/03/2021 10:15 AM Medical Record Number: [id number] Patient Account Number: [id number] Date of Birth/Sex: [birth year] (87 y.o. F) Treating RN: Carlene Coria Primary Care Dayon Witt: Adrian Prows Other  Clinician: Referring Doraine Schexnider: Adrian Prows Treating Avari Gelles/Extender: Skipper Cliche in Treatment: 24 Clinic Level of Care Assessment Items TOOL 1 Quantity Score []  - Use when EandM and Procedure is performed on INITIAL visit 0 ASSESSMENTS - Nursing Assessment / Reassessment []  - General Physical Exam (combine w/ comprehensive assessment (listed just below) when performed on new 0 pt. evals) []  - 0 Comprehensive Assessment (HX, ROS, Risk Assessments, Wounds Hx, etc.) ASSESSMENTS - Wound and Skin Assessment / Reassessment []  - Dermatologic / Skin Assessment (not related to wound area) 0 ASSESSMENTS - Ostomy and/or Continence Assessment and Care []  - Incontinence Assessment and Management 0 []  - 0 Ostomy Care Assessment and Management (repouching, etc.) PROCESS - Coordination of Care []  - Simple Patient / Family Education for ongoing care 0 []  - 0 Complex (extensive) Patient / Family Education for ongoing care []  - 0 Staff obtains Programmer, systems, Records, Test Results / Process Orders []  - 0 Staff telephones HHA, Nursing Homes / Clarify orders / etc []  - 0 Routine Transfer to another Facility (non-emergent condition) []  - 0 Routine Hospital Admission (non-emergent condition) []  - 0 New Admissions / Biomedical engineer / Ordering NPWT, Apligraf, etc. []  - 0 Emergency Hospital Admission (emergent condition) PROCESS - Special Needs []  - Pediatric / Minor Patient Management 0 []  - 0 Isolation Patient Management []  - 0 Hearing / Language / Visual special needs []  - 0 Assessment of Community assistance (transportation, D/C planning, etc.) []  - 0 Additional assistance / Altered mentation []  - 0 Support Surface(s) Assessment (bed, cushion, seat, etc.) INTERVENTIONS - Miscellaneous []  - External ear exam 0 []  - 0 Patient Transfer (multiple staff / Civil Service fast streamer / Similar devices) []  - 0 Simple Staple / Suture removal (25 or less) []  - 0 Complex Staple / Suture removal  (26 or more) []  - 0 Hypo/Hyperglycemic Management (do not check if billed separately) []  - 0 Ankle / Brachial Index (ABI) - do not check if billed separately Has the  patient been seen at the hospital within the last three years: Yes Total Score: 0 Level Of Care: ____ [name] ([id number]) Electronic Signature(s) Signed: 11/04/2021 4:25:56 PM By: Carlene Coria RN Entered By: Carlene Coria on 11/04/2021 16:20:29 Spratt, [name] ([id number]) -------------------------------------------------------------------------------- Encounter Discharge Information Details Patient Name: [name] Date of Service: 11/03/2021 10:15 AM Medical Record Number: [id number] Patient Account Number: [id number] Date of Birth/Sex: 1923/04/07 (87 y.o. F) Treating RN: Carlene Coria Primary Care Udell Mazzocco: Adrian Prows Other Clinician: Referring Legrande Hao: Adrian Prows Treating Monicka Cyran/Extender: Skipper Cliche in Treatment: 24 Encounter Discharge Information Items Post Procedure Vitals Discharge Condition: Stable Temperature (F): 98.3 Ambulatory Status: Walker Pulse (bpm): 82 Discharge Destination: Home Respiratory Rate (breaths/min): 18 Transportation: Private Auto Blood Pressure (mmHg): 141/85 Accompanied By: daughter Schedule Follow-up Appointment: Yes Clinical Summary of Care: Patient Declined Electronic Signature(s) Signed: 11/04/2021 4:25:01 PM By: Carlene Coria RN Entered By: Carlene Coria on 11/04/2021 16:25:00 Suh, [name] ([id number]) -------------------------------------------------------------------------------- Lower Extremity Assessment Details Patient Name: [name] Date of Service: 11/03/2021 10:15 AM Medical Record Number: [id number] Patient Account Number: [id number] Date of Birth/Sex: 1923/11/10 (87 y.o. F) Treating RN: Carlene Coria Primary Care Yessenia Maillet: Adrian Prows Other Clinician: Referring Kharter Sestak: Adrian Prows Treating  Gared Gillie/Extender: Skipper Cliche in Treatment: 24 Edema Assessment Assessed: [Left: No] [Right: No] Edema: [Left: Ye] [Right: s] Calf Left: Right: Point of Measurement: 33 cm From Medial Instep 26 cm Ankle Left: Right: Point of Measurement: 10 cm From Medial Instep 17 cm Vascular Assessment Pulses: Dorsalis Pedis Palpable: [Right:Yes] Electronic Signature(s) Signed: 11/03/2021 4:59:09 PM By: Carlene Coria RN Entered By: Carlene Coria on 11/03/2021 10:56:55 Geisler, [name] ([id number]) -------------------------------------------------------------------------------- Multi Wound Chart Details Patient Name: [name] Date of Service: 11/03/2021 10:15 AM Medical Record Number: [id number] Patient Account Number: [id number] Date of Birth/Sex: 19-Sep-1923 (87 y.o. F) Treating RN: Carlene Coria Primary Care Nyemah Watton: Adrian Prows Other Clinician: Referring Kehinde Totzke: Adrian Prows Treating Devon Kingdon/Extender: Skipper Cliche in Treatment: 24 Vital Signs Height(in): 71 Pulse(bpm): 76 Weight(lbs): 150 Blood Pressure(mmHg): 141/85 Body Mass Index(BMI): 27 Temperature(F): 98.3 Respiratory Rate(breaths/min): 18 Photos: [N/A:N/A] Wound Location: Right, Lateral Malleolus N/A N/A Wounding Event: Gradually Appeared N/A N/A Primary Etiology: Pressure Ulcer N/A N/A Comorbid History: Cataracts, Congestive Heart Failure, N/A N/A Hypertension, Peripheral Arterial Disease, Osteoarthritis, Neuropathy Date Acquired: 05/12/2021 N/A N/A Weeks of Treatment: 24 N/A N/A Wound Status: Open N/A N/A Measurements L x W x D (cm) 0.7x0.8x0.2 N/A N/A Area (cm) : 0.44 N/A N/A Volume (cm) : 0.088 N/A N/A % Reduction in Area: 60.80% N/A N/A % Reduction in Volume: 60.90% N/A N/A Classification: Category/Stage III N/A N/A Exudate Amount: Medium N/A N/A Exudate Type: Serosanguineous N/A N/A Exudate Color: red, brown N/A N/A Wound Margin: Distinct, outline attached N/A  N/A Granulation Amount: Medium (34-66%) N/A N/A Granulation Quality: Pale N/A N/A Necrotic Amount: Medium (34-66%) N/A N/A Exposed Structures: Fat Layer (Subcutaneous Tissue): N/A N/A Yes Fascia: No Tendon: No Muscle: No Joint: No Bone: No Epithelialization: Small (1-33%) N/A N/A Treatment Notes Electronic Signature(s) Signed: 11/03/2021 4:59:09 PM By: Carlene Coria RN Entered By: Carlene Coria on 11/03/2021 11:17:20 Redditt, [name] ([id number]) -------------------------------------------------------------------------------- Multi-Disciplinary Care Plan Details Patient Name: [name] Date of Service: 11/03/2021 10:15 AM Medical Record Number: [id number] Patient Account Number: [id number] Date of Birth/Sex: 1923-08-28 (87 y.o. F) Treating RN: Carlene Coria Primary Care Constantino Starace: Adrian Prows Other Clinician: Referring Ngina Royer: Adrian Prows Treating Adonis Ryther/Extender: Skipper Cliche in Treatment: 24 Active Inactive Electronic Signature(s) Signed: 11/03/2021  4:59:09 PM By: Carlene Coria RN Entered By: Carlene Coria on 11/03/2021 11:16:50 Hearn, [name] ([id number]) -------------------------------------------------------------------------------- Pain Assessment Details Patient Name: [name] Date of Service: 11/03/2021 10:15 AM Medical Record Number: [id number] Patient Account Number: [id number] Date of Birth/Sex: [birth year] (87 y.o. F) Treating RN: Carlene Coria Primary Care Shakina Choy: Adrian Prows Other Clinician: Referring Rigoberto Repass: Adrian Prows Treating Bertin Inabinet/Extender: Skipper Cliche in Treatment: 24 Active Problems Location of Pain Severity and Description of Pain Patient Has Paino No Site Locations Pain Management and Medication Current Pain Management: Electronic Signature(s) Signed: 11/03/2021 4:59:09 PM By: Carlene Coria RN Entered By: Carlene Coria on 11/03/2021 10:54:44 Caldron, [name]  ([id number]) -------------------------------------------------------------------------------- Patient/Caregiver Education Details Patient Name: [name] Date of Service: 11/03/2021 10:15 AM Medical Record Number: [id number] Patient Account Number: [id number] Date of Birth/Gender: 1922/12/25 (87 y.o. F) Treating RN: Carlene Coria Primary Care Physician: Adrian Prows Other Clinician: Referring Physician: Adrian Prows Treating Physician/Extender: Skipper Cliche in Treatment: 24 Education Assessment Education Provided To: Patient Education Topics Provided Wound/Skin Impairment: Methods: Explain/Verbal Responses: State content correctly Electronic Signature(s) Signed: 11/04/2021 4:25:56 PM By: Carlene Coria RN Entered By: Carlene Coria on 11/04/2021 16:20:47 Messmer, [name] ([id number]) -------------------------------------------------------------------------------- Wound Assessment Details Patient Name: [name] Date of Service: 11/03/2021 10:15 AM Medical Record Number: [id number] Patient Account Number: [id number] Date of Birth/Sex: 11/14/1923 (87 y.o. F) Treating RN: Carlene Coria Primary Care Braylee Bosher: Adrian Prows Other Clinician: Referring Ulisses Vondrak: Adrian Prows Treating Mariana Wiederholt/Extender: Skipper Cliche in Treatment: 24 Wound Status Wound Number: 7 Primary Pressure Ulcer Etiology: Wound Location: Right, Lateral Malleolus Wound Open Wounding Event: Gradually Appeared Status: Date Acquired: 05/12/2021 Comorbid Cataracts, Congestive Heart Failure, Hypertension, Weeks Of Treatment: 24 History: Peripheral Arterial Disease, Osteoarthritis, Neuropathy Clustered Wound: No Photos Wound Measurements Length: (cm) 0.7 Width: (cm) 0.8 Depth: (cm) 0.2 Area: (cm) 0.44 Volume: (cm) 0.088 % Reduction in Area: 60.8% % Reduction in Volume: 60.9% Epithelialization: Small (1-33%) Tunneling: No Undermining: No Wound  Description Classification: Category/Stage III Wound Margin: Distinct, outline attached Exudate Amount: Medium Exudate Type: Serosanguineous Exudate Color: red, brown Foul Odor After Cleansing: No Slough/Fibrino Yes Wound Bed Granulation Amount: Medium (34-66%) Exposed Structure Granulation Quality: Pale Fascia Exposed: No Necrotic Amount: Medium (34-66%) Fat Layer (Subcutaneous Tissue) Exposed: Yes Necrotic Quality: Adherent Slough Tendon Exposed: No Muscle Exposed: No Joint Exposed: No Bone Exposed: No Treatment Notes Wound #7 (Malleolus) Wound Laterality: Right, Lateral Cleanser Soap and Water Discharge Instruction: Gently cleanse wound with antibacterial soap, rinse and pat dry prior to dressing wounds Peri-Wound Care [place], [name] ([id number]) Topical Primary Dressing Secondary Dressing Gauze Discharge Instruction: As directed: dry, moistened with saline or moistened with Dakins Solution Secured With Compression Wrap Profore Lite LF 3 Multilayer Compression Bandaging System Discharge Instruction: Apply 3 multi-layer wrap as prescribed. Compression Stockings Add-Ons Electronic Signature(s) Signed: 11/03/2021 4:59:09 PM By: Carlene Coria RN Entered By: Carlene Coria on 11/03/2021 10:55:46 Kau, [name] ([id number]) -------------------------------------------------------------------------------- Vitals Details Patient Name: [name] Date of Service: 11/03/2021 10:15 AM Medical Record Number: [id number] Patient Account Number: [id number] Date of Birth/Sex: [birth year] (87 y.o. F) Treating RN: Carlene Coria Primary Care Madailein Londo: Adrian Prows Other Clinician: Referring Dezirae Service: Adrian Prows Treating Raylee Strehl/Extender: Skipper Cliche in Treatment: 24 Vital Signs Time Taken: 10:30 Temperature (F): 98.3 Height (in): 62 Pulse (bpm): 82 Weight (lbs): 150 Respiratory Rate (breaths/min): 18 Body Mass Index (BMI): 27.4 Blood Pressure  (mmHg): 141/85 Reference Range: 80 - 120 mg / dl Electronic Signature(s) Signed: 11/03/2021 4:59:09 PM  By: Carlene Coria RN Entered By: Carlene Coria on 11/03/2021 10:52:58

## 2021-11-03 NOTE — Progress Notes (Addendum)
[name] ([id number]) Visit Report for 11/03/2021 Chief Complaint Document Details Patient Name: [name]. Date of Service: 11/03/2021 10:15 AM Medical Record Number: [id number] Patient Account Number: [id number] Date of Birth/Sex: [birth year] (87 y.o. F) Treating RN: Carlene Coria Primary Care Provider: Adrian Prows Other Clinician: Referring Provider: Adrian Prows Treating Provider/Extender: Skipper Cliche in Treatment: 24 Information Obtained from: Patient Chief Complaint Right foot ulcer Electronic Signature(s) Signed: 11/03/2021 11:13:27 AM By: Worthy Keeler PA-C Entered By: Worthy Keeler on 11/03/2021 11:13:27 [name], [name] ([id number]) -------------------------------------------------------------------------------- Cellular or Tissue Based Product Details Patient Name: [name] Date of Service: 11/03/2021 10:15 AM Medical Record Number: [id number] Patient Account Number: [id number] Date of Birth/Sex: 1923-11-08 (87 y.o. F) Treating RN: Carlene Coria Primary Care Provider: Adrian Prows Other Clinician: Referring Provider: Adrian Prows Treating Provider/Extender: Skipper Cliche in Treatment: 24 Cellular or Tissue Based Product Type Wound #7 Right,Lateral Malleolus Applied to: Performed By: Physician Tommie Sams., PA-C Cellular or Tissue Based Product [place] Type: Level of Consciousness (Pre- Awake and Alert procedure): Pre-procedure Verification/Time Out Yes - 11:25 Taken: Location: trunk / arms / legs Wound Size (sq cm): 0.56 Product Size (sq cm): 1.6 Waste Size (sq cm): 0 Waste Reason: .2 Amount of Product Applied (sq cm): 1.6 Instrument Used: Forceps, Scissors Lot #: [id number] Order #: 3 Expiration Date: 06/16/2026 Fenestrated: No Reconstituted: Yes Solution Type: normal saline Solution Amount: 10 ml Lot #: [id number] Solution Expiration Date: 05/05/2023 Secured: Yes Secured With:  Steri-Strips Dressing Applied: Yes Primary Dressing: adaptic Procedural Pain: 0 Post Procedural Pain: 0 Response to Treatment: Procedure was tolerated well Level of Consciousness (Post- Awake and Alert procedure): Post Procedure Diagnosis Same as Pre-procedure Electronic Signature(s) Signed: 11/03/2021 4:59:09 PM By: Carlene Coria RN Entered By: Carlene Coria on 11/03/2021 11:31:41 [name], [name] ([id number]) -------------------------------------------------------------------------------- HPI Details Patient Name: [name] Date of Service: 11/03/2021 10:15 AM Medical Record Number: [id number] Patient Account Number: [id number] Date of Birth/Sex: Dec 07, 1922 (87 y.o. 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 Dr. Amalia Hailey the podiatrist saw her on 07/20/2017 and sent her to the wound center for possible hyperbaric oxygen therapy. past medical history of peripheral vascular disease, varicose veins, status post appendectomy, basal cell carcinoma excision from the left leg, cholecystectomy, pacemaker placement, right lower extremity angiography done by Dr. dew in March 2017 with placement of a stent. there is also note of a successful ablation of the right small saphenous vein done which was reviewed by ultrasound on 10/24/2016. the patient had a right small saphenous vein ablation done on 10/20/2016. The patient has never been a smoker. She has been seen by Dr. Corene Cornea dew the vascular surgeon who most recently saw her on 06/15/2017 for evaluation of ongoing problems with right leg swelling. She had a  lower extremity arterial duplex examination done(02/13/17) which showed patent distal right superficial femoral artery stent and above-the-knee popliteal stent without evidence of restenosis. The ABI was more than 1.3 on the right and more than 1.3 on the left. This was consistent with noncompressible arteries due to medial calcification. The right great toe pressure and PPG waveforms are within normal limits and the left great toe pressure and  PPG waveforms are decreased. he recommended she continue to wear her compression stockings and continue with elevation. She is scheduled to have a noninvasive arterial study in the near future 08/16/2017 -- had a lower extremity arterial duplex examination done which showed patent distal right superficial femoral artery stent and above-the-knee popliteal stent without evidence of restenosis. The ABI was more than 1.3 on the right and more than 1.3 on the left. This was consistent with noncompressible arteries due to medial calcification. The right great toe pressure and PPG waveforms are within normal limits and the left great toe pressure and PPG waveforms are decreased. the x-ray of the right ankle has not yet been done 08/24/2017 -- had a right ankle x-ray -- IMPRESSION:1. No fracture, bone lesion or evidence of osteomyelitis. 2. Lateral soft tissue swelling with a soft tissue ulcer. she has not yet seen the vascular surgeon for review 08/31/17 on evaluation today patient's wound appears to be showing signs of improvement. She still with her appointment with vascular in order to review her results of her vascular study and then determine if any intervention would be recommended at that time. No fevers, chills, nausea, or vomiting noted at this time. She has been tolerating the dressing changes without complication. 09/28/17 on evaluation today patient's wound appears to show signs of good improvement in regard to the granulation tissue which is  surfacing. There is still a layer of slough covering the wound and the posterior portion is still significantly deeper than the anterior nonetheless there has been some good sign of things moving towards the better. She is going to go back to Dr. dew for reevaluation to ensure her blood flow is still appropriate. That will be before her next evaluation with [place] next week. No fevers, chills, nausea, or vomiting noted at this time. Patient does have some discomfort rated to be a 3-4/10 depending on activity specifically cleansing the wound makes it worse. 10/05/2017 -- the patient was seen by Dr. Lucky Cowboy last week and noninvasive studies showed a normal right ABI with brisk triphasic waveforms consistent with no arterial insufficiency including normal digital pressures. The duplex showed a patent distal right SFA stent and the proximal SFA was also normal. He was pleased with her test and thought she should have enough of perfusion for normal wound healing. He would see her back in 6 months time. 12/21/17 on evaluation today patient appears to be doing fairly well in regard to her right lateral ankle wound. Unfortunately the main issue that she is expansion at this point is that she is having some issues with what appears to be some cellulitis in the right anterior shin. She has also been noting a little bit of uncomfortable feeling especially last night and her ankle area. I'm afraid that she made the developing a little bit of an infection. With that being said I think it is in the early stages. 12/28/17 on evaluation today patient's ankle appears to be doing excellent. She's making good progress at this point the cellulitis seems to have improved after last week's evaluation. Overall she is having no significant discomfort which is excellent news. She does have an appointment with Dr. dew on March 29, 2018 for reevaluation in regard to the stent he placed. She seems to have excellent blood flow in the right  lower extremity. 01/19/12 on evaluation today patient's wound appears to be doing very well. In fact she does not appear to require debridement at this point, there's no evidence of  infection, and overall from the standpoint of the wound she seems to be doing very well. With that being said I believe that it may be time to switch to different dressing away from the [name] Dressing she tells me she does have a lot going on her friend actually passed away yesterday and she's also having a lot of issues with her husband this obviously is weighing heavy on her as far as your thoughts and concerns today. 01/25/18 on evaluation today patient appears to be doing fairly well in regard to her right lateral malleolus. She has been tolerating the dressing changes without complication. Overall I feel like this is definitely showing signs of improvement as far as how the overall appearance of the wound is there's also evidence of epithelium start to migrate over the granulation tissue. In general I think that she is progressing nicely as far as the wound is concerned. The only concern she really has is whether or not we can switch to every other week visits in order to avoid having as many appointments as her daughters have a difficult time getting her to her appointments as well as the patient's husband to his he is not doing very well at this point. 02/22/18 on evaluation today patient's right lateral malleolus ulcer appears to be doing great. She has been tolerating the dressing changes without complication. Overall you making excellent progress at this time. Patient is having no significant discomfort. DNIYAH, GRANT ([id number]) 03/15/18 on evaluation today patient appears to be doing much more poorly in regard to her right lateral ankle ulcer at this point. Unfortunately since have last seen her her husband has passed just a few days ago is obviously weighed heavily on her her daughter also had  surgery well she is with her today as usual. There does not appear to be any evidence of infection she does seem to have significant contusion/deep tissue injury to the right lateral malleolus which was not noted previous when I saw her last. It's hard to tell of exactly when this injury occurred although during the time she was spending the night in the hospital this may have been most likely. 03/22/18 on evaluation today patient appears to actually be doing very well in regard to her ulcer. She did unfortunately have a setback which was noted last week however the good news is we seem to be getting back on track and in fact the wound in the core did still have some necrotic tissue which will be addressed at this point today but in general I'm seeing signs that things are on the up and up. She is glad to hear this obviously she's been somewhat concerned that due to the how her wound digressed more recently. 03/29/18 on evaluation today patient appears to be doing fairly well in regard to her right lower extremity lateral malleolus ulcer. She unfortunately does have a new area of pressure injury over the inferior portion where the wound has opened up a little bit larger secondary to the pressure she seems to be getting. She does tell me sometimes when she sleeps at night that it actually hurts and does seem to be pushing on the area little bit more unfortunately. There does not appear to be any evidence of infection which is good news. She has been tolerating the dressing changes without complication. She also did have some bruising in the left second and third toes due to the fact that she may have bump this or injured  it although she has neuropathy so she does not feel she did move recently that may have been where this came from. Nonetheless there does not appear to be any evidence of infection at this time. 04/12/18 on evaluation today patient's wound on the right lateral ankle actually appears to be  doing a little bit better with a lot of necrotic docking tissue centrally loosening up in clearing away. However she does have the beginnings of a deep tissue injury on the left lateral malleolus likely due to the fact we've been trying offload the right as much as we have. I think she may benefit from an assistive soft device to help with offloading and it looks like they're looking at one of the doughnut conditions that wraps around the lower leg to offload which I think will definitely do a good job. With that being said I think we definitely need to address this issue on the left before it becomes a wound. Patient is not having significant pain. 04/19/18 on evaluation today patient appears to be doing excellent in regard to the progress she's made with her right lateral ankle ulcer. The left ankle region which did show evidence of a deep tissue injury seems to be resolving there's little fluid noted underneath and a blister there's nothing open at this point in time overall I feel like this is progressing nicely which is good news. She does not seem to be having significant discomfort at this point which is also good news. 04/25/18-She is here in follow up evaluation for bilateral lateral malleolar ulcers. The right lateral malleolus ulcer with pale subcutaneous tissue exposure, central area of ulcer with tendon/periosteum exposed. The left lateral malleolus ulcer now with central area of nonviable tissue, otherwise deep tissue injury. She is wearing compression wraps to the left lower extremity, she will place the right lower extremity compression wraps on when she gets home. She will be out of town over the weekend and return next week and follow-up appointment. She completed her doxycycline this morning 05/03/18 on evaluation today patient appears to be doing very well in regard to her right lateral ankle ulcer in general. At least she's showing some signs of improvement in this regard.  Unfortunately she has some additional injury to the left lateral malleolus region which appears to be new likely even over the past several days. Again this determination is based on the overall appearance. With that being said the patient is obviously frustrated about this currently. 05/10/18-She is here in follow-up evaluation for bilateral lateral malleolar ulcers. She states she has purchased offloading shoes/boots and they will arrive tomorrow. She was asked to bring them in the office at next week's appointment so her provider is aware of product being utilized. She continues to sleep on right or left side, she has been encouraged to sleep on her back. The right lateral malleolus ulcer is precariously close to peri-osteum; will order xray. The left lateral malleolus ulcer is improved. Will switch back to santyl; she will follow up next week. 05/17/18 on evaluation today patient actually appears to be doing very well in regard to her malleolus her ulcers compared to last time I saw them. She does not seem to have as much in the way of contusion at this point which is great news. With that being said she does continue to have discomfort and I do believe that she is still continuing to benefit from the offloading/pressure reducing boots that were recommended. I think this is the  key to trying to get this to heal up completely. 05/24/18 on evaluation today patient actually appears to be doing worse at this point in time unfortunately compared to her last week's evaluation. She is having really no increased pain which is good news unfortunately she does have more maceration in your theme and noted surrounding the right lateral ankle the left lateral ankle is not really is erythematous I do not see signs of the overt cellulitis on that side. Unfortunately the wounds do not seem to have shown any signs of improvement since the last evaluation. She also has significant swelling especially on the right  compared to previous some of this may be due to infection however also think that she may be served better while she has these wounds by compression wrapping versus continuing to use the Juxta-Lite for the time being. Especially with the amount of drainage that she is experiencing at this point. No fevers, chills, nausea, or vomiting noted at this time. 05/31/18 on evaluation today patient appears to actually be doing better in regard to her right lateral lower extremity ulcer specifically on the malleolus region. She has been tolerating the antibiotic without complication. With that being said she still continues to have issues but a little bit of redness although nothing like she what she was experiencing previous. She still continues to pressure to her ankle area she did get the problem on offloading boots unfortunately she will not wear them she states there too uncomfortable and she can't get in and out of the bed. Nonetheless at this point her wounds seem to be continually getting worse which is not what we want I'm getting somewhat concerned about her progress and how things are going to proceed if we do not intervene in some way shape or form. I therefore had a very lengthy conversation today about offloading yet again and even made a specific suggestion for switching her to a memory foam mattress and even gave the information for a specific one that they could look at getting if it was something that they were interested in considering. She does not want to be considered for a hospital bed air mattress although honestly insurance would not cover it that she does not have any wounds on her trunk. 06/14/18 on evaluation today both wounds over the bilateral lateral malleolus her ulcers appear to be doing better there's no evidence of pressure injury at this point. She did get the foam mattress for her bed and this does seem to have been extremely beneficial for her in my pinion. Her daughter  states that she is having difficulty getting out of bed because of how soft it is. The patient also relates this to be. Nonetheless I do feel like she's actually doing better. Unfortunately right after and around the time she was getting the mattress she also sustained a fall when she got up to go pick up the phone and ended up injuring her right elbow she has 18 sutures in place. We are not caring for this currently although home health is going to be taking the sutures out shortly. Nonetheless this may be something that we need to evaluate going forward. It depends on how well it has or has not healed in the end. She also recently saw an orthopedic specialist for an injection in the right shoulder just before her fall unfortunately the fall seems to have worsened her pain. 06/21/18 on evaluation today patient appears to be doing about the same in regard  to her lateral malleolus ulcers. Both appear to be just a little bit deeper but again we are clinging away the necrotic and dead tissue which I think is why this is progressing towards a deeper realm as opposed [name], [name]. ([id number]) to improving from my measurement standpoint in that regard. Nonetheless she has been tolerating the dressing changes she absolutely hates the memory foam mattress topper that was obtained for her nonetheless I do believe this is still doing excellent as far as taking care of excess pressure in regard to the lateral malleolus regions. She in fact has no pressure injury that I see whereas in weeks past it was week by week I was constantly seeing new pressure injuries. Overall I think it has been very beneficial for her. 07/03/18; patient arrives in my clinic today. She has deep punched out areas over her bilateral lateral malleoli. The area on the right has some more depth. We spent a lot of time today talking about pressure relief for these areas. This started when her daughter asked for a prescription for a  memory foam mattress. I have never written a prescription for a mattress and I don't think insurances would pay for that on an ordinary bed. In any case he came up that she has foam boots that she refuses to wear. I would suggest going to these before any other offloading issues when she is in bed. They say she is meticulous about offloading this the rest of the day 07/10/18- She is seen in follow-up evaluation for bilateral, lateral malleolus ulcers. There is no improvement in the ulcers. She has purchased and is sleeping on a memory foam mattress/overlay, she has been using the offloading boots nightly over the past week. She has a follow up appointment with vascular medicine at the end of October, in my opinion this follow up should be expedited given her deterioration and suboptimal TBI results. We will order plain film xray of the left ankle as deeper structures are palpable; would consider having MRI, regardless of xray report(s). The ulcers will be treated with iodoflex/iodosorb, she is unable to safely change the dressings daily with santyl. 07/19/18 on evaluation today patient appears to be doing in general visually well in regard to her bilateral lateral malleolus ulcers. She has been tolerating the dressing changes without complication which is good news. With that being said we did have an x-ray performed on 07/12/18 which revealed a slight loosen see in the lateral portion of the distal left fibula which may represent artifact but underline lytic destruction or osteomyelitis could not be excluded. MRI was recommended. With that being said we can see about getting the patient scheduled for an MRI to further evaluate this area. In fact we have that scheduled currently for August 20 19,019. 07/26/18 on evaluation today patient's wound on the right lateral ankle actually appears to be doing fairly well at this point in my pinion. She has made some good progress currently. With that being said  unfortunately in regard to the left lateral ankle ulcer this seems to be a little bit more problematic at this time. In fact as I further evaluated the situation she actually had bone exposed which is the first time that's been the case in the bone appear to be necrotic. Currently I did review patient's note from Dr. Bunnie Domino office with [facility]. He stated that ABI was 1.26 on the right and 0.95 on the left with good waveforms. Her perfusion is  stable not reduced from previous studies and her digital waveforms were pretty good particularly on the right. His conclusion upon review of the note was that there was not much she could do to improve her perfusion and he felt she was adequate for wound healing. His suggestion was that she continued to see [place] and consider a synthetic skin graft if there was no underlying infection. He plans to see her back in six months or as needed. 08/01/18 on evaluation today patient appears to be doing better in regard to her right lateral ankle ulcer. Her left lateral ankle ulcer is about the same she still has bone involvement in evidence of necrosis. There does not appear to be evidence of infection at this time On the right lateral lower extremity. I have started her on the Augmentin she picked this up and started this yesterday. This is to get her through until she sees infectious disease which is scheduled for 08/12/18. 08/06/18 on evaluation today patient appears to be doing rather well considering my discussion with patient's daughter at the end of last week. The area which was marked where she had erythema seems to be improved and this is good news. With that being said overall the patient seems to be making good improvement when it comes to the overall appearance of the right lateral ankle ulcer although this has been slow she at least is coming around in this regard. Unfortunately in regard to the left lateral ankle ulcer this is osteomyelitis  based on the pathology report as well is bone culture. Nonetheless we are still waiting CT scan. Unfortunately the MRI we originally ordered cannot be performed as the patient is a pacemaker which I had overlooked. Nonetheless we are working on the CT scan approval and scheduling as of now. She did go to the hospital over the weekend and was placed on IV Cefzo for a couple of days. Fortunately this seems to have improved the erythema quite significantly which is good news. There does not appear to be any evidence of worsening infection at this time. She did have some bleeding after the last debridement therefore I did not perform any sharp debridement in regard to left lateral ankle at this point. Patient has been approved for a snap vac for the right lateral ankle. 08/14/18; the patient with wounds over her bilateral lateral malleoli. The area on the right actually looks quite good. Been using a snap back on this area. Healthy granulation and appears to be filling in. Unfortunately the area on the left is really problematic. 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 Also noted to have cellulitis. She saw Dr. Novella Olive of infectious disease today and was put on linezolid. We are able to verify this with her pharmacy. She is completed the Augmentin that she was already on. We've been using Iodoflex to this area 08/23/18 on evaluation today patient's wounds both actually appear to be doing better compared to my prior evaluations. Fortunately she showing signs of good improvement in regard to the overall wound status especially where were using the snap vac on the right. In regard to left lateral malleolus the wound bed actually appears to be much cleaner than previously noted. I do not feel any phone directly probed during evaluation today and though there is tendon noted this does not appear to be necrotic it's actually  fairly good as far as the overall appearance of  the tendon is concerned. In general the wound bed actually appears to be doing significantly better than it was previous. Patient is currently in the care of [name] and I did review that note today. He actually has her on two weeks of linezolid and then following the patient will be on 1-2 months of Keflex. That is the plan currently. She has been on antibiotics therapy as prescribed by myself initially starting on July 30, 2018 and has been on that continuously up to this point. 08/30/18 on evaluation today patient actually appears to be doing much better in regard to her right lateral malleolus ulcer. She has been tolerating the dressing changes specifically the snap vac without complication although she did have some issues with the seal currently. Apparently there was some trouble with getting it to maintain over the past week past Sunday. Nonetheless overall the wound appears better in regard to the right lateral malleolus region. In regard to left lateral malleolus this actually show some signs of additional granulation although there still tendon noted in the base of the wound this appears to be healthy not necrotic in any way whatsoever. We are considering potentially using a snap vac for the left lateral malleolus as well the product wrap from [employer], [place], was present in the clinic today we're going to see this patient I did have her come in with me after obtaining consent from the patient and her daughter in order to look at the wound and see if there's any recommendation one way or another as to whether or not they felt the snapback could be beneficial for the left lateral malleolus region. But the conclusion was that it might be but that this is definitely a little bit deeper wound than what traditionally would be utilized for a snap vac. 09/06/18 on evaluation today patient actually appears to be doing excellent in my pinion in regard to  both ankle ulcers. She has been tolerating the dressing changes without complication which is great news. Specifically we have been using the snap vac. In regard to the right ankle I'm not even sure that this is going to be necessary for today and following as the wound has filled in quite nicely. In regard to the left ankle I do believe [name], [name] ([id number]) that we're seeing excellent epithelialization from the edge as well as granulation in the central portion the tendon is still exposed but there's no evidence of necrotic bone and in general I feel like the patient has made excellent progress even compared to last week with just one week of the snap vac. 09/11/18; this is a patient who has wounds on her bilateral lateral malleoli. Initially both of these were deep stage IV wounds in the setting of chronic arterial insufficiency. She has been revascularized. As I understand think she been using snap vacs to both of these wounds however the area on the right became more superficial and currently she is only using it on the left. Using silver collagen on the right and silver collagen under the back on the left I believe 09/19/18 on evaluation today patient actually appears to be doing very well in regard to her lateral malleolus or ulcers bilaterally. She has been tolerating the dressing changes without complication. Fortunately there does not appear to be any evidence of infection at this time. Overall I feel like she is improving in an excellent manner and I'm very pleased with the fact that everything seems to be turning towards the  better for her. This has obviously been a long road. 09/27/18 on evaluation today patient actually appears to be doing very well in regard to her bilateral lateral malleolus ulcers. She has been tolerating the dressing changes without complication. Fortunately there does not appear to be any evidence of infection at this time which is also great news. No  fevers, chills, nausea, or vomiting noted at this time. Overall I feel like she is doing excellent with the snap vac on the left malleolus. She had 40 mL of fluid collection over the past week. 10/04/18 on evaluation today patient actually appears to be doing well in regard to her bilateral lateral malleolus ulcers. She continues to tolerate the dressing changes without complication. One issue that I see is the snap vac on the left lateral malleolus which appears to have sealed off some fluid underlying this area and has not really allowed it to heal to the degree that I would like to see. For that reason I did suggest at this point we may want to pack a small piece of packing strip into this region to allow it to more effectively wick out fluid. 10/11/18 in general the patient today does not feel that she has been doing very well. She's been a little bit lethargic and subsequently is having bodyaches as well according to what she tells me today. With that being said overall she has been concerned with the fact that something may be worsening although to be honest her wounds really have not been appearing poorly. She does have a new ulcer on her left heel unfortunately. This may be pressure related. Nonetheless it seems to me to have potentially started at least as a blister I do not see any evidence of deep tissue injury. In regard to the left ankle the snap vac still seems to be causing the ceiling off of the deeper part of the wound which is in turn trapping fluid. I'm not extremely pleased with the overall appearance as far as progress from last week to this week therefore I'm gonna discontinue the snap vac at this point. 10/18/18 patient unfortunately this point has not been feeling well for the past several days. She was seen by Grayland Ormond her primary care provider who is a Librarian, academic at [facility]. Subsequently she states that she's been very weak and generally feeling malaise.  No fevers, chills, nausea, or vomiting noted at this time. With that being said bloodwork was performed at the PCP office on the 11th of this month which showed a white blood cell count of 10.7. This was repeated today and shows a white blood cell count of 12.4. This does show signs of worsening. Coupled with the fact that she is feeling worse and that her left ankle wound is not really showing signs of improvement I feel like this is an indication that the osteomyelitis is likely exacerbating not improving. Overall I think we may also want to check her C-reactive protein and sedimentation rate. Actually did call [facility] office this afternoon while the patient was in the office here with me. Subsequently based on the findings we discussed treatment possibilities and I think that it is appropriate for [place] to go ahead and initiate treatment with doxycycline which I'm going to do. Subsequently he did agree to see about adding a CRP and sedimentation rate to her orders. If that has not already been drawn to where they can run it they will contact the patient she can come  back to have that check. They are in agreement with plan as far as the patient and her daughter are concerned. Nonetheless also think we need to get in touch with Dr. Henreitta Leber office to see about getting the patient scheduled with him as soon as possible. 11/08/18 on evaluation today patient presents for follow-up concerning her bilateral foot and ankle ulcers. I did do an extensive review of her chart in epic today. Subsequently she was seen by [name] he did initiate Cefepime IV antibiotic therapy. Subsequently she had some issues with her PICC line this had to be removed because it was coiled and then replaced. Fortunately that was now settled. Unfortunately she has continued have issues with her left heel as well as the issues that she is experiencing with her bilateral lateral malleolus regions. I do believe however both  areas seem to be doing a little bit better on evaluation today which is good news. No fevers, chills, nausea, or vomiting noted at this time. She actually has an angiogram schedule with Dr. dew on this coming Monday, November 11, 2018. Subsequently the patient states that she is feeling much better especially than what she was roughly 2 weeks ago. She actually had to cancel an appointment because she was feeling so poorly. No fevers, chills, nausea, or vomiting noted at this time. 11/15/18 on evaluation today patient actually is status post having had her angiogram with Dr. dew Monday, four days ago. 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 He had to go and work on several areas of the vasculature fortunately he was able to obtain no more than a 30% residual stenosis throughout post procedure. I reviewed this note today. I think this will definitely help with healing at this time. Fortunately there does not appear to be any signs of infection and I do feel like ratio already has a better appearance to it. 11/22/18 upon evaluation today patient actually appears to be doing very well in regard to her wounds in general. The right lateral malleolus looks excellent the heel looks better in the left lateral malleolus also appears to be doing a little better. With that being said the right second toe actually appears to be open and training we been watching this is been dry and stable but now is open. 12/03/2018 Seen today for follow-up and management of multiple bilateral lower extremity wounds. New pressure injury of the great toe which is closed at this time. Wound of the right distal second toe appears larger today with deep undermining and a pocket of fluid present within the undermining region. Left and right malleolus is wounds are stable today with no signs and symptoms of infection.Denies any needs or concerns during exam today. 12/13/18 on evaluation today patient appears  to be doing somewhat better in regard to her left heel ulcer. She also seems to be completely healed in regard to the right lateral malleolus ulcer. The left malleolus ulcer is smaller what unfortunately the wounds which are new over the first and second toes of the right foot are what are most concerning at this point especially the second. Both areas did require sharp debridement today. 12/20/18 on evaluation today patient's wound actually appears to be doing better in regard to left lateral ankle and her right lateral ankle continues to remain healed. The hill ulcer on the left is improved. She does have improvement noted as well in regard to both toe ulcers. Overall I'm very pleased  in this regard. No fevers, chills, nausea, or vomiting noted at this time. 12/23/18 on evaluation today patient is seen after she had her toenails trimmed at the podiatrist office due to issues with her right great toe. There was what appeared to be dark eschar on the surface of the wound which had her in the podiatrist concerned. Nonetheless as I remember that during the last office visit I had utilize silver nitrate of this area I was much less concerned about the situation. Subsequently I was able to clean off much of this tissue without any complication today. This does not appear to show any signs of infection and actually look somewhat better [name], [name] ([id number]) compared to last time post debridement. Her second toe on the right foot actually had callous over and there did appear still be some fluid underneath this that would require debridement today. 12/27/18 on evaluation today patient actually appears to be showing signs of improvement at all locations. Even the left lateral ankle although this is not quite as great as the other sites. Fortunately there does not appear to be any signs of infection at this time and both of her toes on the right foot seem to be showing signs of improvement which is  good news and very pleased in this regard. 01/03/19 on evaluation today patient appears to be doing better for the most part in regard to her wounds in particular. There does not appear to be any evidence of infection at this time which is good news. Fortunately there is no sign of really worsening anywhere except for the right great toe which she does have what appears to be a bruise/deep tissue injury which is very superficial and already resolving. I'm not sure where this came from I questioned her extensively and she does not recall what may have happened with this. Other than that the patient seems to be doing well even the left lateral ankle ulcer looks good and is getting smaller. 01/10/19 on evaluation today patient appears to be doing well in regard to her left heel wound and both of her toe wounds. Overall I feel like there is definitely improvement here and I'm happy in that regard. With that being said unfortunately she is having issues with the left lateral malleolus ulcer which unfortunately still has a lot of depth to it. This is gonna be a very difficult wound for [place] to be able to truly get to heal. I may want to consider some type of skin substitute to see if this would be of benefit for her. I'll discuss this with her more the next visit most likely. This was something I thought about more at the end of the visit when I was Artie out of the room and the patient had been discharged. 01/17/19 on evaluation today patient appears to be doing very well in regard to her wounds in general. She's been making excellent progress at this time. Fortunately there's no sign of infection at this time either. No fevers, chills, nausea, or vomiting noted at this time. The biggest issue is still her left lateral malleolus where it appears to be doing well and is getting smaller but still shows a small corner where this is deeper and goes down into what appears to be the joint space. Nonetheless this is  taking much longer to heal although it still looks better in smaller than previous evaluations. 01/24/19 on evaluation today patient's wounds actually appear to be doing rather well in general  overall. She did require some sharp debridement in regard to the right great toe but everything else appears to be doing excellent no debridement was even necessary. No fevers, chills, nausea, or vomiting noted at this time. 01/31/19 on evaluation today patient actually appears to be doing much better in regard to her left foot wound on the heel as well as the ankle. The right great toe appears to be a little bit worse today this had callous over and trapped a lot of fluid underneath. Fortunately there's no signs of infection at any site which is great news. 02/07/19 on evaluation today patient actually appears to be doing decently well in regard to all of her ulcers at this point. No sharp debridement was required she is a little bit of hyper granulation in regard to the left lateral ankle as well as the left heel but the hill itself is almost completely healed which is excellent news. Overall been very pleased in this regard. 02/14/19 on evaluation today patient actually appears to be doing very well in regard to her ulcers on the right first toe, left lateral malleolus, and left heel. In fact the heel is almost completely healed at this point. The patient does not show any signs of infection which is good news. Overall very pleased with how things have progressed. 04/18/19 Telehealth Evaluation During the COVID-19 National Emergency: Verbal Consent: Obtained from patient Allergies: reviewed and the active list is current. Medication changes: patient has no current medication changes. COVID-19 Screening: 1. Have you traveled internationally or on a cruise ship in the last 14 dayso No 2. Have you had contact with someone with or under investigation for COVID-19o No 3. Have you had a fever, cough, sore throat,  or experiencing shortness of breatho No on evaluation today actually did have a visit with this patient through a telehealth encounter with her home health nurse. Subsequently it was noted that the patient actually appears to be doing okay in regard to her wounds both the right great toe as well as the left lateral malleolus have shown signs of improvement although this in your theme around the left lateral malleolus there eschar coverings for both locations. The question is whether or not they are actually close and whether or not home health needs to discharge the patient or not. Nonetheless my concern is this point obviously is that without actually seeing her and being able to evaluate this directly I cannot ensure that she is completely healed which is the question that I'm being asked. 04/22/19 on evaluation today patient presents for her first evaluation since last time I saw her which was actually February 14, 2019. I did do a telehealth visit last week in which point it was questionable whether or not she may be healed and had to bring her in today for confirmation. With that being said she does seem to be doing quite well at this point which is good news. There does not appear to be any drainage in the deed I believe her wounds may be healed. Readmission: 09/04/2019 on evaluation today patient appears to be doing unfortunately somewhat more poorly in regard to her left foot ulcer secondary to a wound that began on 08/21/2019 at least when she first noticed this. Fortunately she has not had any evidence of active infection at this time. Systemically. I also do not necessarily see any evidence of infection at the blister/wound site on the first metatarsal head plantar aspect. This almost appears to be something  that may have just rubbed inappropriately causing this to breakdown. They did not want a wait too long to come in to be seen as again she had significant issues in the past with wounds that  took quite a while to heal in fact it was close to 2 years. Nonetheless this does not appear to be quite that bad but again we do need to remove some of the necrotic tissue from the surface of the wound to tell exactly the extent. She does not appear to have any significant arterial disease at this point and again her last ABIs and TBI's are recorded above in the alert section her left ABI was 1.27 with a TBI of 0.72 to the right ABI 1.08 with a TBI of 0.39. Other than this the patient has been doing quite well since I last saw her and that was in May 2020. 09/11/2019 on evaluation today patient appeared to be doing very well with regard to her plantar foot ulcer on the left. In fact this appears to be almost completely healed which is awesome. That is after just 1 week of intervention. With that being said there is no signs of active infection at this time. JENTRI, AYE ([id number]) 09/18/2019 on evaluation today patient actually appears to be doing excellent in fact she is completely healed based on what I am seeing at this point. Fortunately there is no signs of active infection at this time and overall patient is very pleased to hear that this area has healed so quickly. Readmission: 05/13/2021 upon evaluation today this patient presents for reevaluation here in the clinic. This is a wound that actually we previously took care of. She had 1 on the right ankle and the left the left turned out to be be harder due to to heal but nonetheless is doing great at this point as the right that has reopened and it was noted first just several weeks ago with a scab over it and came off in just the past few days. Fortunately there does not appear to be any obvious evidence of significant active infection at this time which is great news. No fevers, chills, nausea, vomiting, or diarrhea. 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 There does not appear to be  any signs of this interfering in any way with her wound. She does have swelling we previously had compression socks for her ordered but again it does not look like she wears these on a regular basis by any means. 05/26/2021 upon evaluation today patient appears to be doing well with regard to her wound which is actually showing signs of excellent improvement. There does not appear to be any signs of active infection which is great news and overall very pleased with where things stand today. No fevers, chills, nausea, vomiting, or diarrhea. 06/02/2021 upon evaluation today patient's wound actually showing signs of excellent improvement. Fortunately there does not appear to be any signs of active infection which is great news. I think the patient is making good progress with regard to her wounds in general. 06/09/2021 upon evaluation today patient appears to be doing excellent in regard to her wounds currently. Fortunately there does not appear to be any signs of active infection which is great news. No fevers, chills, nausea, vomiting, or diarrhea. Overall extremely pleased with where things stand today. I think the patient is making excellent progress. 06/16/2021 upon evaluation today patient appears to be doing well  in regard to her wound. This is going require little bit of debridement today and that was discussed with the patient. Otherwise she seems to be doing quite well and I am actually very pleased with where things stand at this point. No fevers, chills, nausea, vomiting, or diarrhea. 06/23/2021 upon evaluation today patient appears to be doing well with regard to her wounds. She has been tolerating the dressing changes without complication. Fortunately there does not appear to be any evidence of infection and she has not had air in her home which she actually lives at an assisted living that got fixed this morning. With that being said because of that her wrap has been extremely hot and  bothersome for her over the past week. 06/30/2021 upon evaluation today patient is actually making excellent progress in regard to her ankle ulcer. She has been tolerating the dressing changes without complication and overall extremely pleased with where things stand there does not appear to be any evidence of active infection which is great news. No fevers, chills, nausea, vomiting, or diarrhea. 07/07/21 upon evaluation today patients and culture on the right actually appears to be doing quite well. There does not appear to be any signs of infection and overall very pleased with where things stand today. No fevers, chills, nausea, or vomiting noted at this time. 07/14/2021 unfortunately the patient today has some evidence of deep tissue injury and pressure getting to the ankle region. Again I am not exactly sure what is going on here but this is very similar to issues that we have had in the past. I explained to the patient that she needs to be very mindful of exactly what is happening I think sleeping in bed is probably the main issue here although there could be other culprits I am not sure what else would potentially lead to this kind of a problem for her. 07/21/2021 upon evaluation today patient's wound actually showing signs of improvement compared to last week. Fortunately there does not appear to be any signs of active infection which is great news and overall very pleased with where things stand in that regard. With that being said I do believe that she is continuing to show signs of overall of getting better although I think this is still basically about what we were 2 weeks ago due to the worsening and now improvement. 07/28/2021 upon evaluation today patient appears to be doing well with regard to her wound. She does have some slough buildup on the surface of the wound which I would have to manage today. Fortunately there is no sign of active infection at this time. No fevers, chills, nausea,  vomiting, or diarrhea. 08/04/2021 upon evaluation today patient appears to be doing about the same in regard to her wound. To be perfectly honest I am beginning to be a little bit concerned about the overall appearance of the wound bed. I do think possibly taking a sample right around the margin of the wound could be beneficial for her as far as identifying anything such as an inflammatory process or to be honest even a skin tag cancer type process that may be of concern here. Fortunately there does not appear to be any evidence of active infection at this time which is great news she is not having any pain also great news. 08/11/2021 upon evaluation today patient appears to be doing well with regard to her wound. The good news is I did review her biopsy results and it showed some  inflammatory mixed findings but nothing that appeared to be malignant which is great news. Overall this is more of a chronic venous stasis type issue which again is more what we have been treating. Nonetheless I just wanted to make sure before going forward that there was not anything more untoward going on at this point. 08/18/2021 upon evaluation today patient appears to be doing well with regard to her ankle ulcer. Fortunately there does not appear to be any signs of active infection at this time which is great overall wound is dramatically improved compared to last week. Since last week I have actually placed her on doxycycline and subsequently this is a good option as far as the findings are concerned at this point. 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 the doxycycline is doing that. There does not appear to be any evidence of active infection systemically which is great news. 08/25/2021 upon evaluation today patient appears to be doing well with regard to her wound. I feel like we are finally  get back on track as far as healing is concerned I am much happier with the overall appearance today. I do think that she is tolerating the dressing changes without complication which is great news. We have been using Hydrofera Blue which I think is a good option. The good news is she is also doing great in regard to her compression sock on the left which is a zipper compression that seems to be doing a great job keeping her edema under good control. 09/01/2021 upon evaluation today patient appears to be doing well with regard to her wound. Infection seems to be under much better control which is great news and very pleased in that regard. Fortunately there does not appear to be any signs of infection currently. [name], [name] ([id number]) 09/08/2021 upon evaluation today patient actually appears to be making good progress in regard to her wound. She has been tolerating the dressing changes without complication. Fortunately there does not appear to be any evidence of active infection at this time which is great news as well. No fevers, chills, nausea, vomiting, or diarrhea. 09/22/2021 upon evaluation today patient appears to be doing well with regard to her wound although is very slow to heal. We have not looked into Apligraf yet I think that is something that we should see about doing. 09/29/2021 upon evaluation today patient's wound is actually showing signs of doing about the same. I am not seeing any evidence of worsening but also no significant evidence of improvement. We did gain approval for the organogenesis products all except for Apligraf as covered by her insurance. With that being said I do think that we can go ahead and proceed with the NuShield if the patient and her family in agreement of the plan I discussed that with him today she does have a 20% coinsurance which we also discussed. 10/06/2021 upon evaluation today patient appears to unfortunately be doing a little bit worse she  appears to be infected based on what I am seeing. Fortunately there does not appear to be any signs of active infection at this time which is great news. Unfortunately it does appear to be some evidence of around the wound edge indicated by way of erythema and warmth as well as redness 10/13/2021 upon evaluation today patient actually appears to be doing excellent in  regard to her ankle ulcer compared to what it was. 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 I think were fine to go ahead and apply the NuShield today. 10/20/2021 upon evaluation today patient appears to be doing excellent in fact the NuShield seems to have done all some for her thus far. I am actually very pleased with where we stand and overall I think that she is making great progress. There is no evidence of active infection at this time. 11/23; patient presents for follow-up. She has no issues or complaints today. She denies signs of infection. She reports stability in her wound healing. 11/03/2021 upon evaluation today patient appears to be doing well with regard to her wounds. She has been tolerating the dressing changes without complication. Fortunately there is no signs of active infection at this time. Electronic Signature(s) Signed: 11/03/2021 11:32:08 AM By: Worthy Keeler PA-C Entered By: Worthy Keeler on 11/03/2021 11:32:07 [name], [name] ([id number]) -------------------------------------------------------------------------------- Physical Exam Details Patient Name: [name] Date of Service: 11/03/2021 10:15 AM Medical Record Number: [id number] Patient Account Number: [id number] Date of Birth/Sex: 1923/08/06 (87 y.o. F) Treating RN: Carlene Coria Primary Care Provider: Adrian Prows Other  Clinician: Referring Provider: Adrian Prows Treating Provider/Extender: Skipper Cliche in Treatment: 64 Constitutional Well-nourished and well-hydrated in no acute distress. Respiratory normal breathing without difficulty. Psychiatric this patient is able to make decisions and demonstrates good insight into disease process. Alert and Oriented x 3. pleasant and cooperative. Notes Patient's wound bed showed signs of excellent improvement with the NuShield and I am extremely pleased with the progress we have made up to this point. Overall I think that we are headed towards complete closure this is about half the size it was last week which is even better. Electronic Signature(s) Signed: 11/03/2021 11:32:26 AM By: Worthy Keeler PA-C Entered By: Worthy Keeler on 11/03/2021 11:32:26 [name], [name] ([id number]) -------------------------------------------------------------------------------- Physician Orders Details Patient Name: [name] Date of Service: 11/03/2021 10:15 AM Medical Record Number: [id number] Patient Account Number: [id number] Date of Birth/Sex: [birth year] (87 y.o. 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 Bathing/ Shower/ Hygiene o May shower with wound dressing protected with water repellent cover or cast protector. Cellular or Tissue Based Products o Cellular or Tissue Based Product Type: - NuShield #4 o Cellular or Tissue Based Product  applied to wound bed; including contact layer, fixation with steri-strips, dry gauze and cover dressing. (DO NOT REMOVE). Edema Control - Lymphedema / Segmental Compressive Device / Other o Optional: One layer of unna paste to top of compression wrap (to act as an anchor). - and at toes o Elevate, Exercise Daily and Avoid Standing for Long Periods of Time. o Elevate legs to the level of the heart and pump ankles as often as possible o Elevate leg(s) parallel to the floor when sitting. Wound Treatment Wound #7 - Malleolus Wound Laterality: Right, Lateral  Cleanser: Soap and Water 1 x Per Week/30 Days Discharge Instructions: Gently cleanse wound with antibacterial soap, rinse and pat dry prior to dressing wounds Secondary Dressing: Gauze 1 x Per Week/30 Days Discharge Instructions: As directed: dry, moistened with saline or moistened with Dakins Solution Compression Wrap: Profore Lite LF 3 Multilayer Compression Bandaging System 1 x Per Week/30 Days Discharge Instructions: Apply 3 multi-layer wrap as prescribed. Patient Medications Allergies: Sulfa (Sulfonamide Antibiotics), metronidazole, monistat Notifications Medication Indication Start End doxycycline hyclate 11/03/2021 DOSE 1 - oral 100 mg capsule - 1 capsule oral taken 2 times per day for 14 days Electronic Signature(s) Signed: 11/03/2021 4:59:09 PM By: Carlene Coria RN Signed: 11/03/2021 5:34:12 PM By: Worthy Keeler PA-C Previous Signature: 11/03/2021 11:34:08 AM Version By: Worthy Keeler PA-C Entered By: Carlene Coria on 11/03/2021 16:15:07 Patricelli, [name] ([id number]) Kugel, [name] ([id number]) -------------------------------------------------------------------------------- Problem List Details Patient Name: [name] Date of Service: 11/03/2021 10:15 AM Medical Record Number: [id number] Patient Account Number: [id number] Date of Birth/Sex: 1923/03/13 (87 y.o. F) Treating RN: Carlene Coria Primary Care  Provider: Adrian Prows Other Clinician: Referring Provider: Adrian Prows Treating Provider/Extender: Skipper Cliche in Treatment: 24 Active Problems ICD-10 Encounter Code Description Active Date MDM Diagnosis 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, stage 3 05/13/2021 No Yes I89.0 Lymphedema, not elsewhere classified 05/13/2021 No Yes I87.2 Venous insufficiency (chronic) (peripheral) 05/13/2021 No Yes Z95.0 Presence of cardiac pacemaker 05/13/2021 No Yes Z79.01 Long term (current) use of anticoagulants 05/13/2021 No Yes Inactive Problems Resolved Problems Electronic Signature(s) Signed: 11/03/2021 11:13:08 AM By: Worthy Keeler PA-C Entered By: Worthy Keeler on 11/03/2021 11:13:08 Munday, [name] ([id number]) -------------------------------------------------------------------------------- Progress Note Details Patient Name: [name] Date of Service: 11/03/2021 10:15 AM Medical Record Number: [id number] Patient Account Number: [id number] Date of Birth/Sex: 08/27/1923 (87 y.o. 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 Dr. Amalia Hailey the podiatrist saw her on 07/20/2017 and sent her to the wound center for possible hyperbaric oxygen therapy. past medical history of peripheral vascular disease, varicose veins, status post appendectomy, basal cell carcinoma excision from the left leg, cholecystectomy, pacemaker placement, right lower extremity angiography done by Dr. dew in March 2017 with placement of a stent. there  is also note of a successful ablation of the right small saphenous vein done which was reviewed by ultrasound on 10/24/2016. the patient had a right small saphenous vein ablation done on 10/20/2016. The patient has never been a smoker. She has been seen by Dr. Corene Cornea dew the vascular surgeon who most recently saw her on 06/15/2017 for evaluation of ongoing problems with right leg swelling. She had a lower extremity arterial duplex examination done(02/13/17) which showed patent distal right superficial femoral artery stent and above-the-knee popliteal stent without evidence of restenosis. The ABI was more than 1.3 on the right and more than 1.3 on the left. This was consistent with noncompressible arteries due to medial calcification. The right great toe pressure and PPG waveforms are within normal limits and the left great toe pressure and PPG waveforms are decreased. he recommended she continue to wear her compression stockings and continue with elevation. She is  scheduled to have a noninvasive arterial study in the near future 08/16/2017 -- had a lower extremity arterial duplex examination done which showed patent distal right superficial femoral artery stent and above-the-knee popliteal stent without evidence of restenosis. The ABI was more than 1.3 on the right and more than 1.3 on the left. This was consistent with noncompressible arteries due to medial calcification. The right great toe pressure and PPG waveforms are within normal limits and the left great toe pressure and PPG waveforms are decreased. the x-ray of the right ankle has not yet been done 08/24/2017 -- had a right ankle x-ray -- IMPRESSION:1. No fracture, bone lesion or evidence of osteomyelitis. 2. Lateral soft tissue swelling with a soft tissue ulcer. she has not yet seen the vascular surgeon for review 08/31/17 on evaluation today patient's wound appears to be showing signs of improvement. She still with her appointment with  vascular in order to review her results of her vascular study and then determine if any intervention would be recommended at that time. No fevers, chills, nausea, or vomiting noted at this time. She has been tolerating the dressing changes without complication. 09/28/17 on evaluation today patient's wound appears to show signs of good improvement in regard to the granulation tissue which is surfacing. There is still a layer of slough covering the wound and the posterior portion is still significantly deeper than the anterior nonetheless there has been some good sign of things moving towards the better. She is going to go back to Dr. dew for reevaluation to ensure her blood flow is still appropriate. That will be before her next evaluation with [place] next week. No fevers, chills, nausea, or vomiting noted at this time. Patient does have some discomfort rated to be a 3-4/10 depending on activity specifically cleansing the wound makes it worse. 10/05/2017 -- the patient was seen by Dr. Lucky Cowboy last week and noninvasive studies showed a normal right ABI with brisk triphasic waveforms consistent with no arterial insufficiency including normal digital pressures. The duplex showed a patent distal right SFA stent and the proximal SFA was also normal. He was pleased with her test and thought she should have enough of perfusion for normal wound healing. He would see her back in 6 months time. 12/21/17 on evaluation today patient appears to be doing fairly well in regard to her right lateral ankle wound. Unfortunately the main issue that she is expansion at this point is that she is having some issues with what appears to be some cellulitis in the right anterior shin. She has also been noting a little bit of uncomfortable feeling especially last night and her ankle area. I'm afraid that she made the developing a little bit of an infection. With that being said I think it is in the early stages. 12/28/17 on evaluation  today patient's ankle appears to be doing excellent. She's making good progress at this point the cellulitis seems to have improved after last week's evaluation. Overall she is having no significant discomfort which is excellent news. She does have an appointment with Dr. dew on March 29, 2018 for reevaluation in regard to the stent he placed. She seems to have excellent blood flow in the right lower extremity. 01/19/12 on evaluation today patient's wound appears to be doing very well. In fact she does not appear to require debridement at this point, there's no evidence of infection, and overall from the standpoint of the wound she seems to be doing very well. With that being  said I believe that it may be time to switch to different dressing away from the [facility] Dressing she tells me she does have a lot going on her friend actually passed away yesterday and she's also having a lot of issues with her husband this obviously is weighing heavy on her as far as your thoughts and concerns today. 01/25/18 on evaluation today patient appears to be doing fairly well in regard to her right lateral malleolus. She has been tolerating the dressing changes without complication. Overall I feel like this is definitely showing signs of improvement as far as how the overall appearance of the wound is there's also evidence of epithelium start to migrate over the granulation tissue. In general I think that she is progressing nicely as far as the wound is concerned. The only concern she really has is whether or not we can switch to every other week visits in order to avoid having as many TEODORA, BAUMGARTEN ([id number]) appointments as her daughters have a difficult time getting her to her appointments as well as the patient's husband to his he is not doing very well at this point. 02/22/18 on evaluation today patient's right lateral malleolus ulcer appears to be doing great. She has been tolerating the dressing  changes without complication. Overall you making excellent progress at this time. Patient is having no significant discomfort. 03/15/18 on evaluation today patient appears to be doing much more poorly in regard to her right lateral ankle ulcer at this point. Unfortunately since have last seen her her husband has passed just a few days ago is obviously weighed heavily on her her daughter also had surgery well she is with her today as usual. There does not appear to be any evidence of infection she does seem to have significant contusion/deep tissue injury to the right lateral malleolus which was not noted previous when I saw her last. It's hard to tell of exactly when this injury occurred although during the time she was spending the night in the hospital this may have been most likely. 03/22/18 on evaluation today patient appears to actually be doing very well in regard to her ulcer. She did unfortunately have a setback which was noted last week however the good news is we seem to be getting back on track and in fact the wound in the core did still have some necrotic tissue which will be addressed at this point today but in general I'm seeing signs that things are on the up and up. She is glad to hear this obviously she's been somewhat concerned that due to the how her wound digressed more recently. 03/29/18 on evaluation today patient appears to be doing fairly well in regard to her right lower extremity lateral malleolus ulcer. She unfortunately does have a new area of pressure injury over the inferior portion where the wound has opened up a little bit larger secondary to the pressure she seems to be getting. She does tell me sometimes when she sleeps at night that it actually hurts and does seem to be pushing on the area little bit more unfortunately. There does not appear to be any evidence of infection which is good news. She has been tolerating the dressing changes without complication. She also  did have some bruising in the left second and third toes due to the fact that she may have bump this or injured it although she has neuropathy so she does not feel she did move recently that may have been  where this came from. Nonetheless there does not appear to be any evidence of infection at this time. 04/12/18 on evaluation today patient's wound on the right lateral ankle actually appears to be doing a little bit better with a lot of necrotic docking tissue centrally loosening up in clearing away. However she does have the beginnings of a deep tissue injury on the left lateral malleolus likely due to the fact we've been trying offload the right as much as we have. I think she may benefit from an assistive soft device to help with offloading and it looks like they're looking at one of the doughnut conditions that wraps around the lower leg to offload which I think will definitely do a good job. With that being said I think we definitely need to address this issue on the left before it becomes a wound. Patient is not having significant pain. 04/19/18 on evaluation today patient appears to be doing excellent in regard to the progress she's made with her right lateral ankle ulcer. The left ankle region which did show evidence of a deep tissue injury seems to be resolving there's little fluid noted underneath and a blister there's nothing open at this point in time overall I feel like this is progressing nicely which is good news. She does not seem to be having significant discomfort at this point which is also good news. 04/25/18-She is here in follow up evaluation for bilateral lateral malleolar ulcers. The right lateral malleolus ulcer with pale subcutaneous tissue exposure, central area of ulcer with tendon/periosteum exposed. The left lateral malleolus ulcer now with central area of nonviable tissue, otherwise deep tissue injury. She is wearing compression wraps to the left lower extremity, she will  place the right lower extremity compression wraps on when she gets home. She will be out of town over the weekend and return next week and follow-up appointment. She completed her doxycycline this morning 05/03/18 on evaluation today patient appears to be doing very well in regard to her right lateral ankle ulcer in general. At least she's showing some signs of improvement in this regard. Unfortunately she has some additional injury to the left lateral malleolus region which appears to be new likely even over the past several days. Again this determination is based on the overall appearance. With that being said the patient is obviously frustrated about this currently. 05/10/18-She is here in follow-up evaluation for bilateral lateral malleolar ulcers. She states she has purchased offloading shoes/boots and they will arrive tomorrow. She was asked to bring them in the office at next week's appointment so her provider is aware of product being utilized. She continues to sleep on right or left side, she has been encouraged to sleep on her back. The right lateral malleolus ulcer is precariously close to peri-osteum; will order xray. The left lateral malleolus ulcer is improved. Will switch back to santyl; she will follow up next week. 05/17/18 on evaluation today patient actually appears to be doing very well in regard to her malleolus her ulcers compared to last time I saw them. She does not seem to have as much in the way of contusion at this point which is great news. With that being said she does continue to have discomfort and I do believe that she is still continuing to benefit from the offloading/pressure reducing boots that were recommended. I think this is the key to trying to get this to heal up completely. 05/24/18 on evaluation today patient actually appears to be  doing worse at this point in time unfortunately compared to her last week's evaluation. She is having really no increased pain which  is good news unfortunately she does have more maceration in your theme and noted surrounding the right lateral ankle the left lateral ankle is not really is erythematous I do not see signs of the overt cellulitis on that side. Unfortunately the wounds do not seem to have shown any signs of improvement since the last evaluation. She also has significant swelling especially on the right compared to previous some of this may be due to infection however also think that she may be served better while she has these wounds by compression wrapping versus continuing to use the Juxta-Lite for the time being. Especially with the amount of drainage that she is experiencing at this point. No fevers, chills, nausea, or vomiting noted at this time. 05/31/18 on evaluation today patient appears to actually be doing better in regard to her right lateral lower extremity ulcer specifically on the malleolus region. She has been tolerating the antibiotic without complication. With that being said she still continues to have issues but a little bit of redness although nothing like she what she was experiencing previous. She still continues to pressure to her ankle area she did get the problem on offloading boots unfortunately she will not wear them she states there too uncomfortable and she can't get in and out of the bed. Nonetheless at this point her wounds seem to be continually getting worse which is not what we want I'm getting somewhat concerned about her progress and how things are going to proceed if we do not intervene in some way shape or form. I therefore had a very lengthy conversation today about offloading yet again and even made a specific suggestion for switching her to a memory foam mattress and even gave the information for a specific one that they could look at getting if it was something that they were interested in considering. She does not want to be considered for a hospital bed air mattress although  honestly insurance would not cover it that she does not have any wounds on her trunk. 06/14/18 on evaluation today both wounds over the bilateral lateral malleolus her ulcers appear to be doing better there's no evidence of pressure injury at this point. She did get the foam mattress for her bed and this does seem to have been extremely beneficial for her in my pinion. Her daughter states that she is having difficulty getting out of bed because of how soft it is. The patient also relates this to be. Nonetheless I do feel like she's actually doing better. Unfortunately right after and around the time she was getting the mattress she also sustained a fall when she got up to go pick up the phone and ended up injuring her right elbow she has 18 sutures in place. We are not caring for this currently although home health is going to be taking the sutures out shortly. Nonetheless this may be something that we need to evaluate going forward. It depends on [name], [name] ([id number]) how well it has or has not healed in the end. She also recently saw an orthopedic specialist for an injection in the right shoulder just before her fall unfortunately the fall seems to have worsened her pain. 06/21/18 on evaluation today patient appears to be doing about the same in regard to her lateral malleolus ulcers. Both appear to be just a little bit deeper but  again we are clinging away the necrotic and dead tissue which I think is why this is progressing towards a deeper realm as opposed to improving from my measurement standpoint in that regard. Nonetheless she has been tolerating the dressing changes she absolutely hates the memory foam mattress topper that was obtained for her nonetheless I do believe this is still doing excellent as far as taking care of excess pressure in regard to the lateral malleolus regions. She in fact has no pressure injury that I see whereas in weeks past it was week by week I was constantly  seeing new pressure injuries. Overall I think it has been very beneficial for her. 07/03/18; patient arrives in my clinic today. She has deep punched out areas over her bilateral lateral malleoli. The area on the right has some more depth. We spent a lot of time today talking about pressure relief for these areas. This started when her daughter asked for a prescription for a memory foam mattress. I have never written a prescription for a mattress and I don't think insurances would pay for that on an ordinary bed. In any case he came up that she has foam boots that she refuses to wear. I would suggest going to these before any other offloading issues when she is in bed. They say she is meticulous about offloading this the rest of the day 07/10/18- She is seen in follow-up evaluation for bilateral, lateral malleolus ulcers. There is no improvement in the ulcers. She has purchased and is sleeping on a memory foam mattress/overlay, she has been using the offloading boots nightly over the past week. She has a follow up appointment with vascular medicine at the end of October, in my opinion this follow up should be expedited given her deterioration and suboptimal TBI results. We will order plain film xray of the left ankle as deeper structures are palpable; would consider having MRI, regardless of xray report(s). The ulcers will be treated with iodoflex/iodosorb, she is unable to safely change the dressings daily with santyl. 07/19/18 on evaluation today patient appears to be doing in general visually well in regard to her bilateral lateral malleolus ulcers. She has been tolerating the dressing changes without complication which is good news. With that being said we did have an x-ray performed on 07/12/18 which revealed a slight loosen see in the lateral portion of the distal left fibula which may represent artifact but underline lytic destruction or osteomyelitis could not be excluded. MRI was recommended. With  that being said we can see about getting the patient scheduled for an MRI to further evaluate this area. In fact we have that scheduled currently for August 20 19,019. 07/26/18 on evaluation today patient's wound on the right lateral ankle actually appears to be doing fairly well at this point in my pinion. She has made some good progress currently. With that being said unfortunately in regard to the left lateral ankle ulcer this seems to be a little bit more problematic at this time. In fact as I further evaluated the situation she actually had bone exposed which is the first time that's been the case in the bone appear to be necrotic. Currently I did review patient's note from Dr. Bunnie Domino office with [facility]. He stated that ABI was 1.26 on the right and 0.95 on the left with good waveforms. Her perfusion is stable not reduced from previous studies and her digital waveforms were pretty good particularly on the right. His conclusion  upon review of the note was that there was not much she could do to improve her perfusion and he felt she was adequate for wound healing. His suggestion was that she continued to see [place] and consider a synthetic skin graft if there was no underlying infection. He plans to see her back in six months or as needed. 08/01/18 on evaluation today patient appears to be doing better in regard to her right lateral ankle ulcer. Her left lateral ankle ulcer is about the same she still has bone involvement in evidence of necrosis. There does not appear to be evidence of infection at this time On the right lateral lower extremity. I have started her on the Augmentin she picked this up and started this yesterday. This is to get her through until she sees infectious disease which is scheduled for 08/12/18. 08/06/18 on evaluation today patient appears to be doing rather well considering my discussion with patient's daughter at the end of last week. The area which was  marked where she had erythema seems to be improved and this is good news. With that being said overall the patient seems to be making good improvement when it comes to the overall appearance of the right lateral ankle ulcer although this has been slow she at least is coming around in this regard. Unfortunately in regard to the left lateral ankle ulcer this is osteomyelitis based on the pathology report as well is bone culture. Nonetheless we are still waiting CT scan. Unfortunately the MRI we originally ordered cannot be performed as the patient is a pacemaker which I had overlooked. Nonetheless we are working on the CT scan approval and scheduling as of now. She did go to the hospital over the weekend and was placed on IV Cefzo for a couple of days. Fortunately this seems to have improved the erythema quite significantly which is good news. There does not appear to be any evidence of worsening infection at this time. She did have some bleeding after the last debridement therefore I did not perform any sharp debridement in regard to left lateral ankle at this point. Patient has been approved for a snap vac for the right lateral ankle. 08/14/18; the patient with wounds over her bilateral lateral malleoli. The area on the right actually looks quite good. Been using a snap back on this area. Healthy granulation and appears to be filling in. Unfortunately the area on the left is really problematic. 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 Also noted to have cellulitis. She saw Dr. Novella Olive of infectious disease today and was put on linezolid. We are able to verify this with her pharmacy. She is completed the Augmentin that she was already on. We've been using Iodoflex to this area 08/23/18 on evaluation today patient's wounds both actually appear to be doing better compared to my prior evaluations. Fortunately she  showing signs of good improvement in regard to the overall wound status especially where were using the snap vac on the right. In regard to left lateral malleolus the wound bed actually appears to be much cleaner than previously noted. I do not feel any phone directly probed during evaluation today and though there is tendon noted this does not appear to be necrotic it's actually fairly good as far as the overall appearance of the tendon is concerned. In general the wound bed actually appears to be doing significantly better than it was  previous. Patient is currently in the care of [name] and I did review that note today. He actually has her on two weeks of linezolid and then following the patient will be on 1-2 months of Keflex. That is the plan currently. She has been on antibiotics therapy as prescribed by myself initially starting on July 30, 2018 and has been on that continuously up to this point. 08/30/18 on evaluation today patient actually appears to be doing much better in regard to her right lateral malleolus ulcer. She has been tolerating the dressing changes specifically the snap vac without complication although she did have some issues with the seal currently. Apparently there was some trouble with getting it to maintain over the past week past Sunday. Nonetheless overall the wound appears better in regard to the right lateral malleolus region. In regard to left lateral malleolus this actually show some signs of additional granulation although there still tendon noted in the base of the wound this appears to be healthy not necrotic in any way whatsoever. We are considering potentially using a snap vac for the left lateral malleolus as well the product wrap from [employer], [employer], was present in the clinic today we're going to see this patient I did have her come in with me after obtaining consent from the patient and her daughter in order to look at the wound and see if there's any  recommendation one way or another as to whether or not they felt the snapback could be beneficial for the left lateral malleolus region. But [name], [name] ([id number]) the conclusion was that it might be but that this is definitely a little bit deeper wound than what traditionally would be utilized for a snap vac. 09/06/18 on evaluation today patient actually appears to be doing excellent in my pinion in regard to both ankle ulcers. She has been tolerating the dressing changes without complication which is great news. Specifically we have been using the snap vac. In regard to the right ankle I'm not even sure that this is going to be necessary for today and following as the wound has filled in quite nicely. In regard to the left ankle I do believe that we're seeing excellent epithelialization from the edge as well as granulation in the central portion the tendon is still exposed but there's no evidence of necrotic bone and in general I feel like the patient has made excellent progress even compared to last week with just one week of the snap vac. 09/11/18; this is a patient who has wounds on her bilateral lateral malleoli. Initially both of these were deep stage IV wounds in the setting of chronic arterial insufficiency. She has been revascularized. As I understand think she been using snap vacs to both of these wounds however the area on the right became more superficial and currently she is only using it on the left. Using silver collagen on the right and silver collagen under the back on the left I believe 09/19/18 on evaluation today patient actually appears to be doing very well in regard to her lateral malleolus or ulcers bilaterally. She has been tolerating the dressing changes without complication. Fortunately there does not appear to be any evidence of infection at this time. Overall I feel like she is improving in an excellent manner and I'm very pleased with the fact that everything  seems to be turning towards the better for her. This has obviously been a long road. 09/27/18 on evaluation today patient actually appears to  be doing very well in regard to her bilateral lateral malleolus ulcers. She has been tolerating the dressing changes without complication. Fortunately there does not appear to be any evidence of infection at this time which is also great news. No fevers, chills, nausea, or vomiting noted at this time. Overall I feel like she is doing excellent with the snap vac on the left malleolus. She had 40 mL of fluid collection over the past week. 10/04/18 on evaluation today patient actually appears to be doing well in regard to her bilateral lateral malleolus ulcers. She continues to tolerate the dressing changes without complication. One issue that I see is the snap vac on the left lateral malleolus which appears to have sealed off some fluid underlying this area and has not really allowed it to heal to the degree that I would like to see. For that reason I did suggest at this point we may want to pack a small piece of packing strip into this region to allow it to more effectively wick out fluid. 10/11/18 in general the patient today does not feel that she has been doing very well. She's been a little bit lethargic and subsequently is having bodyaches as well according to what she tells me today. With that being said overall she has been concerned with the fact that something may be worsening although to be honest her wounds really have not been appearing poorly. She does have a new ulcer on her left heel unfortunately. This may be pressure related. Nonetheless it seems to me to have potentially started at least as a blister I do not see any evidence of deep tissue injury. In regard to the left ankle the snap vac still seems to be causing the ceiling off of the deeper part of the wound which is in turn trapping fluid. I'm not extremely pleased with the overall appearance  as far as progress from last week to this week therefore I'm gonna discontinue the snap vac at this point. 10/18/18 patient unfortunately this point has not been feeling well for the past several days. She was seen by Grayland Ormond her primary care provider who is a Librarian, academic at [facility]. Subsequently she states that she's been very weak and generally feeling malaise. No fevers, chills, nausea, or vomiting noted at this time. With that being said bloodwork was performed at the PCP office on the 11th of this month which showed a white blood cell count of 10.7. This was repeated today and shows a white blood cell count of 12.4. This does show signs of worsening. Coupled with the fact that she is feeling worse and that her left ankle wound is not really showing signs of improvement I feel like this is an indication that the osteomyelitis is likely exacerbating not improving. Overall I think we may also want to check her C-reactive protein and sedimentation rate. Actually did call [facility] office this afternoon while the patient was in the office here with me. Subsequently based on the findings we discussed treatment possibilities and I think that it is appropriate for [place] to go ahead and initiate treatment with doxycycline which I'm going to do. Subsequently he did agree to see about adding a CRP and sedimentation rate to her orders. If that has not already been drawn to where they can run it they will contact the patient she can come back to have that check. They are in agreement with plan as far as the patient and her daughter  are concerned. Nonetheless also think we need to get in touch with Dr. Henreitta Leber office to see about getting the patient scheduled with him as soon as possible. 11/08/18 on evaluation today patient presents for follow-up concerning her bilateral foot and ankle ulcers. I did do an extensive review of her chart in epic today. Subsequently she was seen by Dr.  [name] he did initiate Cefepime IV antibiotic therapy. Subsequently she had some issues with her PICC line this had to be removed because it was coiled and then replaced. Fortunately that was now settled. Unfortunately she has continued have issues with her left heel as well as the issues that she is experiencing with her bilateral lateral malleolus regions. I do believe however both areas seem to be doing a little bit better on evaluation today which is good news. No fevers, chills, nausea, or vomiting noted at this time. She actually has an angiogram schedule with Dr. dew on this coming Monday, November 11, 2018. Subsequently the patient states that she is feeling much better especially than what she was roughly 2 weeks ago. She actually had to cancel an appointment because she was feeling so poorly. No fevers, chills, nausea, or vomiting noted at this time. 11/15/18 on evaluation today patient actually is status post having had her angiogram with Dr. dew Monday, four days ago. 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 He had to go and work on several areas of the vasculature fortunately he was able to obtain no more than a 30% residual stenosis throughout post procedure. I reviewed this note today. I think this will definitely help with healing at this time. Fortunately there does not appear to be any signs of infection and I do feel like ratio already has a better appearance to it. 11/22/18 upon evaluation today patient actually appears to be doing very well in regard to her wounds in general. The right lateral malleolus looks excellent the heel looks better in the left lateral malleolus also appears to be doing a little better. With that being said the right second toe actually appears to be open and training we been watching this is been dry and stable but now is open. 12/03/2018 Seen today for follow-up and management of multiple bilateral lower extremity wounds. New  pressure injury of the great toe which is closed at this time. Wound of the right distal second toe appears larger today with deep undermining and a pocket of fluid present within the undermining region. Left and right malleolus is wounds are stable today with no signs and symptoms of infection.Denies any needs or concerns during exam today. 12/13/18 on evaluation today patient appears to be doing somewhat better in regard to her left heel ulcer. She also seems to be completely healed in regard to the right lateral malleolus ulcer. The left malleolus ulcer is smaller what unfortunately the wounds which are new over the first and second toes of the right foot are what are most concerning at this point especially the second. Both areas did require sharp debridement today. 12/20/18 on evaluation today patient's wound actually appears to be doing better in regard to left lateral ankle and her right lateral ankle continues to remain healed. The hill ulcer on the left is improved. She does have improvement noted as well in regard to both toe ulcers. Overall I'm very pleased in this regard. No fevers, chills, nausea, or vomiting noted at this time. [name], [name] ([id number]) 12/23/18 on  evaluation today patient is seen after she had her toenails trimmed at the podiatrist office due to issues with her right great toe. There was what appeared to be dark eschar on the surface of the wound which had her in the podiatrist concerned. Nonetheless as I remember that during the last office visit I had utilize silver nitrate of this area I was much less concerned about the situation. Subsequently I was able to clean off much of this tissue without any complication today. This does not appear to show any signs of infection and actually look somewhat better compared to last time post debridement. Her second toe on the right foot actually had callous over and there did appear still be some fluid underneath this that  would require debridement today. 12/27/18 on evaluation today patient actually appears to be showing signs of improvement at all locations. Even the left lateral ankle although this is not quite as great as the other sites. Fortunately there does not appear to be any signs of infection at this time and both of her toes on the right foot seem to be showing signs of improvement which is good news and very pleased in this regard. 01/03/19 on evaluation today patient appears to be doing better for the most part in regard to her wounds in particular. There does not appear to be any evidence of infection at this time which is good news. Fortunately there is no sign of really worsening anywhere except for the right great toe which she does have what appears to be a bruise/deep tissue injury which is very superficial and already resolving. I'm not sure where this came from I questioned her extensively and she does not recall what may have happened with this. Other than that the patient seems to be doing well even the left lateral ankle ulcer looks good and is getting smaller. 01/10/19 on evaluation today patient appears to be doing well in regard to her left heel wound and both of her toe wounds. Overall I feel like there is definitely improvement here and I'm happy in that regard. With that being said unfortunately she is having issues with the left lateral malleolus ulcer which unfortunately still has a lot of depth to it. This is gonna be a very difficult wound for [place] to be able to truly get to heal. I may want to consider some type of skin substitute to see if this would be of benefit for her. I'll discuss this with her more the next visit most likely. This was something I thought about more at the end of the visit when I was Artie out of the room and the patient had been discharged. 01/17/19 on evaluation today patient appears to be doing very well in regard to her wounds in general. She's been making  excellent progress at this time. Fortunately there's no sign of infection at this time either. No fevers, chills, nausea, or vomiting noted at this time. The biggest issue is still her left lateral malleolus where it appears to be doing well and is getting smaller but still shows a small corner where this is deeper and goes down into what appears to be the joint space. Nonetheless this is taking much longer to heal although it still looks better in smaller than previous evaluations. 01/24/19 on evaluation today patient's wounds actually appear to be doing rather well in general overall. She did require some sharp debridement in regard to the right great toe but everything else appears to  be doing excellent no debridement was even necessary. No fevers, chills, nausea, or vomiting noted at this time. 01/31/19 on evaluation today patient actually appears to be doing much better in regard to her left foot wound on the heel as well as the ankle. The right great toe appears to be a little bit worse today this had callous over and trapped a lot of fluid underneath. Fortunately there's no signs of infection at any site which is great news. 02/07/19 on evaluation today patient actually appears to be doing decently well in regard to all of her ulcers at this point. No sharp debridement was required she is a little bit of hyper granulation in regard to the left lateral ankle as well as the left heel but the hill itself is almost completely healed which is excellent news. Overall been very pleased in this regard. 02/14/19 on evaluation today patient actually appears to be doing very well in regard to her ulcers on the right first toe, left lateral malleolus, and left heel. In fact the heel is almost completely healed at this point. The patient does not show any signs of infection which is good news. Overall very pleased with how things have progressed. 04/18/19 Telehealth Evaluation During the COVID-19 National  Emergency: Verbal Consent: Obtained from patient Allergies: reviewed and the active list is current. Medication changes: patient has no current medication changes. COVID-19 Screening: 1. Have you traveled internationally or on a cruise ship in the last 14 dayso No 2. Have you had contact with someone with or under investigation for COVID-19o No 3. Have you had a fever, cough, sore throat, or experiencing shortness of breatho No on evaluation today actually did have a visit with this patient through a telehealth encounter with her home health nurse. Subsequently it was noted that the patient actually appears to be doing okay in regard to her wounds both the right great toe as well as the left lateral malleolus have shown signs of improvement although this in your theme around the left lateral malleolus there eschar coverings for both locations. The question is whether or not they are actually close and whether or not home health needs to discharge the patient or not. Nonetheless my concern is this point obviously is that without actually seeing her and being able to evaluate this directly I cannot ensure that she is completely healed which is the question that I'm being asked. 04/22/19 on evaluation today patient presents for her first evaluation since last time I saw her which was actually February 14, 2019. I did do a telehealth visit last week in which point it was questionable whether or not she may be healed and had to bring her in today for confirmation. With that being said she does seem to be doing quite well at this point which is good news. There does not appear to be any drainage in the deed I believe her wounds may be healed. Readmission: 09/04/2019 on evaluation today patient appears to be doing unfortunately somewhat more poorly in regard to her left foot ulcer secondary to a wound that began on 08/21/2019 at least when she first noticed this. Fortunately she has not had any evidence of  active infection at this time. Systemically. I also do not necessarily see any evidence of infection at the blister/wound site on the first metatarsal head plantar aspect. This almost appears to be something that may have just rubbed inappropriately causing this to breakdown. They did not want a wait too long  to come in to be seen as again she had significant issues in the past with wounds that took quite a while to heal in fact it was close to 2 years. Nonetheless this does not appear to be quite that bad but again we do need to remove some of the necrotic tissue from the surface of the wound to tell exactly the extent. She does not appear to have any significant arterial disease at this point and again her last ABIs and TBI's are recorded above in the alert section her left ABI was 1.27 with a TBI of 0.72 to the right ABI 1.08 with a TBI of 0.39. Other than this the patient has been doing quite [name], [name] ([id number]) well since I last saw her and that was in May 2020. 09/11/2019 on evaluation today patient appeared to be doing very well with regard to her plantar foot ulcer on the left. In fact this appears to be almost completely healed which is awesome. That is after just 1 week of intervention. With that being said there is no signs of active infection at this time. 09/18/2019 on evaluation today patient actually appears to be doing excellent in fact she is completely healed based on what I am seeing at this point. Fortunately there is no signs of active infection at this time and overall patient is very pleased to hear that this area has healed so quickly. Readmission: 05/13/2021 upon evaluation today this patient presents for reevaluation here in the clinic. This is a wound that actually we previously took care of. She had 1 on the right ankle and the left the left turned out to be be harder due to to heal but nonetheless is doing great at this point as the right that has reopened and  it was noted first just several weeks ago with a scab over it and came off in just the past few days. Fortunately there does not appear to be any obvious evidence of significant active infection at this time which is great news. No fevers, chills, nausea, vomiting, or diarrhea. 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 There does not appear to be any signs of this interfering in any way with her wound. She does have swelling we previously had compression socks for her ordered but again it does not look like she wears these on a regular basis by any means. 05/26/2021 upon evaluation today patient appears to be doing well with regard to her wound which is actually showing signs of excellent improvement. There does not appear to be any signs of active infection which is great news and overall very pleased with where things stand today. No fevers, chills, nausea, vomiting, or diarrhea. 06/02/2021 upon evaluation today patient's wound actually showing signs of excellent improvement. Fortunately there does not appear to be any signs of active infection which is great news. I think the patient is making good progress with regard to her wounds in general. 06/09/2021 upon evaluation today patient appears to be doing excellent in regard to her wounds currently. Fortunately there does not appear to be any signs of active infection which is great news. No fevers, chills, nausea, vomiting, or diarrhea. Overall extremely pleased with where things stand today. I think the patient is making excellent progress. 06/16/2021 upon evaluation today patient appears to be doing well in regard to her wound. This is going require little bit of debridement today and that was discussed with  the patient. Otherwise she seems to be doing quite well and I am actually very pleased with where things stand at this point. No fevers, chills, nausea, vomiting, or diarrhea. 06/23/2021 upon  evaluation today patient appears to be doing well with regard to her wounds. She has been tolerating the dressing changes without complication. Fortunately there does not appear to be any evidence of infection and she has not had air in her home which she actually lives at an assisted living that got fixed this morning. With that being said because of that her wrap has been extremely hot and bothersome for her over the past week. 06/30/2021 upon evaluation today patient is actually making excellent progress in regard to her ankle ulcer. She has been tolerating the dressing changes without complication and overall extremely pleased with where things stand there does not appear to be any evidence of active infection which is great news. No fevers, chills, nausea, vomiting, or diarrhea. 07/07/21 upon evaluation today patients and culture on the right actually appears to be doing quite well. There does not appear to be any signs of infection and overall very pleased with where things stand today. No fevers, chills, nausea, or vomiting noted at this time. 07/14/2021 unfortunately the patient today has some evidence of deep tissue injury and pressure getting to the ankle region. Again I am not exactly sure what is going on here but this is very similar to issues that we have had in the past. I explained to the patient that she needs to be very mindful of exactly what is happening I think sleeping in bed is probably the main issue here although there could be other culprits I am not sure what else would potentially lead to this kind of a problem for her. 07/21/2021 upon evaluation today patient's wound actually showing signs of improvement compared to last week. Fortunately there does not appear to be any signs of active infection which is great news and overall very pleased with where things stand in that regard. With that being said I do believe that she is continuing to show signs of overall of getting better  although I think this is still basically about what we were 2 weeks ago due to the worsening and now improvement. 07/28/2021 upon evaluation today patient appears to be doing well with regard to her wound. She does have some slough buildup on the surface of the wound which I would have to manage today. Fortunately there is no sign of active infection at this time. No fevers, chills, nausea, vomiting, or diarrhea. 08/04/2021 upon evaluation today patient appears to be doing about the same in regard to her wound. To be perfectly honest I am beginning to be a little bit concerned about the overall appearance of the wound bed. I do think possibly taking a sample right around the margin of the wound could be beneficial for her as far as identifying anything such as an inflammatory process or to be honest even a skin tag cancer type process that may be of concern here. Fortunately there does not appear to be any evidence of active infection at this time which is great news she is not having any pain also great news. 08/11/2021 upon evaluation today patient appears to be doing well with regard to her wound. The good news is I did review her biopsy results and it showed some inflammatory mixed findings but nothing that appeared to be malignant which is great news. Overall this is more of  a chronic venous stasis type issue which again is more what we have been treating. Nonetheless I just wanted to make sure before going forward that there was not anything more untoward going on at this point. 08/18/2021 upon evaluation today patient appears to be doing well with regard to her ankle ulcer. Fortunately there does not appear to be any signs of active infection at this time which is great overall wound is dramatically improved compared to last week. Since last week I have actually placed her on doxycycline and subsequently this is a good option as far as the findings are concerned at this point. I do believe that the  positive result of MRSA is definitely something that needed to be addressed and the good news is The doxycycline is doing a good job of doing this. the doxycycline is doing that. There does not appear to be any evidence of active infection systemically which is great news. 08/25/2021 upon evaluation today patient appears to be doing well with regard to her wound. I feel like we are finally get back on track as far as healing is concerned I am much happier with the overall appearance today. I do think that she is tolerating the dressing changes without complication which is great news. We have been using Hydrofera Blue which I think is a good option. The good news is she is also doing great in [place], LUANN ASPINWALL. ([id number]) regard to her compression sock on the left which is a zipper compression that seems to be doing a great job keeping her edema under good control. 09/01/2021 upon evaluation today patient appears to be doing well with regard to her wound. Infection seems to be under much better control which is great news and very pleased in that regard. Fortunately there does not appear to be any signs of infection currently. 09/08/2021 upon evaluation today patient actually appears to be making good progress in regard to her wound. She has been tolerating the dressing changes without complication. Fortunately there does not appear to be any evidence of active infection at this time which is great news as well. No fevers, chills, nausea, vomiting, or diarrhea. 09/22/2021 upon evaluation today patient appears to be doing well with regard to her wound although is very slow to heal. We have not looked into Apligraf yet I think that is something that we should see about doing. 09/29/2021 upon evaluation today patient's wound is actually showing signs of doing about the same. I am not seeing any evidence of worsening but also no significant evidence of improvement. We did gain approval for the  organogenesis products all except for Apligraf as covered by her insurance. With that being said I do think that we can go ahead and proceed with the NuShield if the patient and her family in agreement of the plan I discussed that with him today she does have a 20% coinsurance which we also discussed. 10/06/2021 upon evaluation today patient appears to unfortunately be doing a little bit worse she appears to be infected based on what I am seeing. Fortunately there does not appear to be any signs of active infection at this time which is great news. Unfortunately it does appear to be some evidence of around the wound edge indicated by way of erythema and warmth as well as redness 10/13/2021 upon evaluation today patient actually appears to be doing excellent in regard to her ankle ulcer compared to what it was. Fortunately though she does have evidence of infection, MRSA,  on the culture which I did review this overall should be managed by the antibiotic that I given her which was the doxycycline and again today this seems to be doing much better. I think were fine to go ahead and apply the NuShield today. 10/20/2021 upon evaluation today patient appears to be doing excellent in fact the NuShield seems to have done all some for her thus far. I am actually very pleased with where we stand and overall I think that she is making great progress. There is no evidence of active infection at this time. 11/23; patient presents for follow-up. She has no issues or complaints today. She denies signs of infection. She reports stability in her wound healing. 11/03/2021 upon evaluation today patient appears to be doing well with regard to her wounds. She has been tolerating the dressing changes without complication. Fortunately there is no signs of active infection at this time. Objective Constitutional Well-nourished and well-hydrated in no acute distress. Vitals Time Taken: 10:30 AM, Height: 62 in, Weight: 150  lbs, BMI: 27.4, Temperature: 98.3 F, Pulse: 82 bpm, Respiratory Rate: 18 breaths/min, Blood Pressure: 141/85 mmHg. Respiratory normal breathing without difficulty. Psychiatric this patient is able to make decisions and demonstrates good insight into disease process. Alert and Oriented x 3. pleasant and cooperative. General Notes: Patient's wound bed showed signs of excellent improvement with the NuShield and I am extremely pleased with the progress we have made up to this point. Overall I think that we are headed towards complete closure this is about half the size it was last week which is even better. Integumentary (Hair, Skin) Wound #7 status is Open. Original cause of wound was Gradually Appeared. The date acquired was: 05/12/2021. The wound has been in treatment 24 weeks. The wound is located on the Right,Lateral Malleolus. The wound measures 0.7cm length x 0.8cm width x 0.2cm depth; 0.44cm^2 area and 0.088cm^3 volume. There is Fat Layer (Subcutaneous Tissue) exposed. There is no tunneling or undermining noted. There is a medium amount of serosanguineous drainage noted. The wound margin is distinct with the outline attached to the wound base. There is medium (34-66%) pale granulation within the wound bed. There is a medium (34-66%) amount of necrotic tissue within the wound bed including Adherent Slough. Assessment Christiane, Sistare [name] ([id number]) Active Problems ICD-10 Pressure ulcer of right ankle, stage 3 Lymphedema, not elsewhere classified Venous insufficiency (chronic) (peripheral) Presence of cardiac pacemaker Long term (current) use of anticoagulants Procedures Wound #7 Pre-procedure diagnosis of Wound #7 is a Pressure Ulcer located on the Right,Lateral Malleolus. A skin graft procedure using a bioengineered skin substitute/cellular or tissue based product was performed by Tommie Sams., PA-C with the following instrument(s): Forceps and Scissors. Nushield was applied and  secured with Steri-Strips. 1.6 sq cm of product was utilized and 0 sq cm was wasted due to .2. Post Application, adaptic was applied. A Time Out was conducted at 11:25, prior to the start of the procedure. The procedure was tolerated well with a pain level of 0 throughout and a pain level of 0 following the procedure. Post procedure Diagnosis Wound #7: Same as Pre-Procedure . Plan Follow-up Appointments: Return Appointment in 1 week. Bathing/ Shower/ Hygiene: May shower with wound dressing protected with water repellent cover or cast protector. Cellular or Tissue Based Products: Cellular or Tissue Based Product Type: - NuShield #3 Cellular or Tissue Based Product applied to wound bed; including contact layer, fixation with steri-strips, dry gauze and cover dressing. (DO NOT REMOVE). Edema  Control - Lymphedema / Segmental Compressive Device / Other: Optional: One layer of unna paste to top of compression wrap (to act as an anchor). - and at toes Elevate, Exercise Daily and Avoid Standing for Long Periods of Time. Elevate legs to the level of the heart and pump ankles as often as possible Elevate leg(s) parallel to the floor when sitting. The following medication(s) was prescribed: doxycycline hyclate oral 100 mg capsule 1 1 capsule oral taken 2 times per day for 14 days starting 11/03/2021 WOUND #7: - Malleolus Wound Laterality: Right, Lateral Cleanser: Soap and Water 1 x Per Week/30 Days Discharge Instructions: Gently cleanse wound with antibacterial soap, rinse and pat dry prior to dressing wounds Secondary Dressing: Gauze 1 x Per Week/30 Days Discharge Instructions: As directed: dry, moistened with saline or moistened with Dakins Solution Compression Wrap: Profore Lite LF 3 Multilayer Compression Bandaging System 1 x Per Week/30 Days Discharge Instructions: Apply 3 multi-layer wrap as prescribed. 1. Recommend currently that going to continue with the NuShield I did perform a little bit  of slight debridement to clear away some of the slough and biofilm on the surface of the wound as well as some of the callus around the edges of the wound she tolerated that without complication today. Post debridement wound bed appears to be doing great which is excellent news. 2. After applying the NuShield this was secured with Steri-Strips and Adaptic. I did put a very light gauze bolster over this as well. 3. She also has some redness over her left ankle I feel like this is more pressure related but we going down this road before where she had issues 2 years ago right around Christmas time with a similar experience with her right ankle which then as we got close to healing that went into the left ankle. Nonetheless I think at this point we need to try to do what we can to prevent this from worsening I am going to send in a prescription for an antibiotic I am not 100% sure that is infected but is definitely sore and there could be an issue here. She is in agreement with this plan. If anything does change or worsen she should contact the office and let me know as soon as possible. Her daughter was present during this conversation today as well. We will see patient back for reevaluation in 1 week here in the clinic. If anything worsens or changes patient will contact our office for additional recommendations. Electronic Signature(s) Signed: 11/03/2021 11:34:22 AM By: Sherene Sires, [name] ([id number]) Entered By: Worthy Keeler on 11/03/2021 11:34:21 [place], [name] ([id number]) -------------------------------------------------------------------------------- SuperBill Details Patient Name: [name] Date of Service: 11/03/2021 Medical Record Number: [id number] Patient Account Number: [id number] Date of Birth/Sex: [birth year] (87 y.o. F) Treating RN: Carlene Coria Primary Care Provider: Adrian Prows Other Clinician: Referring Provider: Adrian Prows Treating Provider/Extender: Skipper Cliche in Treatment: 24 Diagnosis Coding ICD-10 Codes Code Description [phone] Pressure ulcer of right ankle, stage 3 I89.0 Lymphedema, not elsewhere classified I87.2 Venous insufficiency (chronic) (peripheral) Z95.0 Presence of cardiac pacemaker Z79.01 Long term (current) use of anticoagulants Facility Procedures CPT4 Code: [id number] Description: Q4160 NUSHIELD 1.6 SQ CM (CHG PER SQ CM) Modifier: Quantity: 1.6 CPT4 Code: [id number] Description: 67209 - SKIN SUB GRAFT TRNK/ARM/LEG Modifier: Quantity: 1 CPT4 Code: Description: ICD-10 Diagnosis Description 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, stage 3 Modifier: Quantity: Physician Procedures CPT4 Code: 4709628 Description: 36629 - WC PHYS SKIN SUB  GRAFT TRNK/ARM/LEG Modifier: Quantity: 1 CPT4 Code: Description: ICD-10 Diagnosis Description 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, stage 3 Modifier: Quantity: Electronic Signature(s) Signed: 11/03/2021 11:34:38 AM By: Worthy Keeler PA-C Entered By: Worthy Keeler on 11/03/2021 11:34:37

## 2021-11-10 ENCOUNTER — Other Ambulatory Visit: Payer: Self-pay

## 2021-11-10 ENCOUNTER — Encounter: Payer: Medicare Other | Admitting: Physician Assistant

## 2021-11-10 DIAGNOSIS — L89513 Pressure ulcer of right ankle, stage 3: Secondary | ICD-10-CM | POA: Diagnosis not present

## 2021-11-10 NOTE — Progress Notes (Signed)
[name] ([id number]) Visit Report for 11/10/2021 Arrival Information Details Patient Name: [name], [name] Date of Service: 11/10/2021 10:15 AM Medical Record Number: [id number] Patient Account Number: [id number] Date of Birth/Sex: [birth year] (87 y.o. F) Treating RN: [name] Primary Care [name]: [name] Other Clinician: Referring [name]: [name] Treating [name]/Extender: [name] in Treatment: 25 Visit Information History Since Last Visit All ordered tests and consults were completed: No Patient Arrived: [name] Added or deleted any medications: No Arrival Time: 10:06 Any new allergies or adverse reactions: No Accompanied By: daughter Had a fall or experienced change in No Transfer Assistance: None activities of daily living that may affect Patient Identification Verified: Yes risk of falls: Secondary Verification Process Completed: Yes Signs or symptoms of abuse/neglect since last visito No Patient Requires Transmission-Based No Hospitalized since last visit: No Precautions: Implantable device outside of the clinic excluding No Patient Has Alerts: Yes cellular tissue based products placed in the center Patient Alerts: Patient on Blood since last visit: Thinner Has Dressing in Place as Prescribed: Yes NOT DIABETIC Has Compression in Place as Prescribed: Yes ***ELIQUIS*** Pain Present Now: No Electronic Signature(s) Signed: 11/10/2021 1:47:42 PM By: [name] Coria RN Entered By: [name] on 11/10/2021 10:11:20 Dimond, [name] ([id number]) -------------------------------------------------------------------------------- Clinic Level of Care Assessment Details Patient Name: [name] Date of Service: 11/10/2021 10:15 AM Medical Record Number: [id number] Patient Account Number: [id number] Date of Birth/Sex: 09-02-1923 (87 y.o. F) Treating RN: [name] Primary Care [name]: [name] Other  Clinician: Referring [name]: [name] Treating [name]/Extender: [name] in Treatment: 25 Clinic Level of Care Assessment Items TOOL 1 Quantity Score []  - Use when EandM and Procedure is performed on INITIAL visit 0 ASSESSMENTS - Nursing Assessment / Reassessment []  - General Physical Exam (combine w/ comprehensive assessment (listed just below) when performed on new 0 pt. evals) []  - 0 Comprehensive Assessment (HX, ROS, Risk Assessments, Wounds Hx, etc.) ASSESSMENTS - Wound and Skin Assessment / Reassessment []  - Dermatologic / Skin Assessment (not related to wound area) 0 ASSESSMENTS - Ostomy and/or Continence Assessment and Care []  - Incontinence Assessment and Management 0 []  - 0 Ostomy Care Assessment and Management (repouching, etc.) PROCESS - Coordination of Care []  - Simple Patient / Family Education for ongoing care 0 []  - 0 Complex (extensive) Patient / Family Education for ongoing care []  - 0 Staff obtains Programmer, systems, Records, Test Results / Process Orders []  - 0 Staff telephones HHA, Nursing Homes / Clarify orders / etc []  - 0 Routine Transfer to another Facility (non-emergent condition) []  - 0 Routine Hospital Admission (non-emergent condition) []  - 0 New Admissions / Biomedical engineer / Ordering NPWT, Apligraf, etc. []  - 0 Emergency Hospital Admission (emergent condition) PROCESS - Special Needs []  - Pediatric / Minor Patient Management 0 []  - 0 Isolation Patient Management []  - 0 Hearing / Language / Visual special needs []  - 0 Assessment of Community assistance (transportation, D/C planning, etc.) []  - 0 Additional assistance / Altered mentation []  - 0 Support Surface(s) Assessment (bed, cushion, seat, etc.) INTERVENTIONS - Miscellaneous []  - External ear exam 0 []  - 0 Patient Transfer (multiple staff / Civil Service fast streamer / Similar devices) []  - 0 Simple Staple / Suture removal (25 or less) []  - 0 Complex Staple / Suture removal  (26 or more) []  - 0 Hypo/Hyperglycemic Management (do not check if billed separately) []  - 0 Ankle / Brachial Index (ABI) - do not check if billed separately Has the  patient been seen at the hospital within the last three years: Yes Total Score: 0 Level Of Care: ____ [name] ([id number]) Electronic Signature(s) Signed: 11/10/2021 1:47:42 PM By: [name] Coria RN Entered By: [name] on 11/10/2021 10:45:30 Sparkman, [name] ([id number]) -------------------------------------------------------------------------------- Encounter Discharge Information Details Patient Name: [name] Date of Service: 11/10/2021 10:15 AM Medical Record Number: [id number] Patient Account Number: [id number] Date of Birth/Sex: Sep 07, 1923 (87 y.o. F) Treating RN: [name] Primary Care [name]: [name] Other Clinician: Referring [name]: [name] Treating [name]/Extender: [name] in Treatment: 25 Encounter Discharge Information Items Post Procedure Vitals Discharge Condition: Stable Temperature (F): 98.5 Ambulatory Status: Walker Pulse (bpm): 78 Discharge Destination: Home Respiratory Rate (breaths/min): 18 Transportation: Private Auto Blood Pressure (mmHg): 167/87 Accompanied By: self Schedule Follow-up Appointment: Yes Clinical Summary of Care: Patient Declined Electronic Signature(s) Signed: 11/10/2021 1:47:42 PM By: [name] Coria RN Entered By: [name] on 11/10/2021 11:00:04 Coston, [name] ([id number]) -------------------------------------------------------------------------------- Lower Extremity Assessment Details Patient Name: [name] Date of Service: 11/10/2021 10:15 AM Medical Record Number: [id number] Patient Account Number: [id number] Date of Birth/Sex: [birth year] (87 y.o. F) Treating RN: [name] Primary Care Nitin Mckowen: [name] Other Clinician: Referring Marchella Hibbard: [name] Treating  Ardyce Heyer/Extender: [name] in Treatment: 25 Edema Assessment Assessed: [Left: No] [name]: No] [Left: Edema] [Right: :] Calf Left: Right: Point of Measurement: 33 cm From Medial Instep 26 cm Ankle Left: Right: Point of Measurement: 10 cm From Medial Instep 17 cm Vascular Assessment Pulses: Dorsalis Pedis Palpable: [Right:Yes] Electronic Signature(s) Signed: 11/10/2021 1:47:42 PM By: [name] Coria RN Entered By: [name] on 11/10/2021 10:33:10 Berkovich, [name] ([id number]) -------------------------------------------------------------------------------- Multi Wound Chart Details Patient Name: [name] Date of Service: 11/10/2021 10:15 AM Medical Record Number: [id number] Patient Account Number: [id number] Date of Birth/Sex: 1923/08/21 (87 y.o. F) Treating RN: [name] Primary Care Jakaria Lavergne: [name] Other Clinician: Referring Gerianne Simonet: [name] Treating Kanai Berrios/Extender: [name] in Treatment: 25 Vital Signs Height(in): 62 Pulse(bpm): 78 Weight(lbs): 150 Blood Pressure(mmHg): 167/87 Body Mass Index(BMI): 27 Temperature(F): 98.5 Respiratory Rate(breaths/min): 18 Photos: [7:No Photos] [N/A:N/A] Wound Location: [7:Right, Lateral Malleolus] [N/A:N/A] Wounding Event: [7:Gradually Appeared] [N/A:N/A] Primary Etiology: [7:Pressure Ulcer] [N/A:N/A] Date Acquired: [7:05/12/2021] [N/A:N/A] Weeks of Treatment: [7:25] [N/A:N/A] Wound Status: [7:Open] [N/A:N/A] Measurements L x W x D (cm) [7:0.7x0.8x0.2] [N/A:N/A] Area (cm) : [7:0.44] [N/A:N/A] Volume (cm) : [7:0.088] [N/A:N/A] % Reduction in Area: [7:60.80%] [N/A:N/A] % Reduction in Volume: [7:60.90%] [N/A:N/A] Classification: [7:Category/Stage III] [N/A:N/A] Exudate Amount: [7:Medium] [N/A:N/A] Exudate Type: [7:Serosanguineous red, brown] [N/A:N/A N/A] Treatment Notes Electronic Signature(s) Signed: 11/10/2021 1:47:42 PM By: [name] Coria RN Entered By: [name] on  11/10/2021 10:33:52 Weiland, [name] ([id number]) -------------------------------------------------------------------------------- [place] Details Patient Name: [name] Date of Service: 11/10/2021 10:15 AM Medical Record Number: [id number] Patient Account Number: [id number] Date of Birth/Sex: 07-01-1923 (87 y.o. F) Treating RN: [name] Primary Care Estrella Alcaraz: [name] Other Clinician: Referring Emree Locicero: [name] Treating Tacora Athanas/Extender: [name] in Treatment: 25 Active Inactive Electronic Signature(s) Signed: 11/10/2021 1:47:42 PM By: [name] Coria RN Entered By: [name] on 11/10/2021 10:33:20 Swisher, [name] ([id number]) -------------------------------------------------------------------------------- Pain Assessment Details Patient Name: [name] Date of Service: 11/10/2021 10:15 AM Medical Record Number: [id number] Patient Account Number: [id number] Date of Birth/Sex: [birth year] (87 y.o. F) Treating RN: [name] Primary Care Sriansh Farra: [name] Other Clinician: Referring Khloi Rawl: [name] Treating Corliss Coggeshall/Extender: [name] in Treatment: 25 Active Problems Location of Pain Severity and Description  of Pain Patient Has Paino No Site Locations Pain Management and Medication Current Pain Management: Electronic Signature(s) Signed: 11/10/2021 1:47:42 PM By: [name] Coria RN Entered By: [name] on 11/10/2021 10:11:48 Royce, [name] ([id number]) -------------------------------------------------------------------------------- Patient/Caregiver Education Details Patient Name: [name] Date of Service: 11/10/2021 10:15 AM Medical Record Number: [id number] Patient Account Number: [id number] Date of Birth/Gender: 1923-05-30 (87 y.o. F) Treating RN: [name] Primary Care Physician: [name] Other Clinician: Referring Physician: Adrian [name] Treating Physician/Extender: [name] in Treatment: 25 Education Assessment Education Provided To: Patient Education Topics Provided Wound/Skin Impairment: Methods: Explain/Verbal Responses: State content correctly Electronic Signature(s) Signed: 11/10/2021 1:47:42 PM By: [name] Coria RN Entered By: [name] on 11/10/2021 10:45:53 Kamen, [name] ([id number]) -------------------------------------------------------------------------------- Wound Assessment Details Patient Name: [name] Date of Service: 11/10/2021 10:15 AM Medical Record Number: [id number] Patient Account Number: [id number] Date of Birth/Sex: [birth year] (87 y.o. F) Treating RN: [name] Primary Care Javier Mamone: [name] Other Clinician: Referring Gurshan Settlemire: [name] Treating Saidee Geremia/Extender: [name] in Treatment: 25 Wound Status Wound Number: 7 Primary Etiology: Pressure Ulcer Wound Location: Right, Lateral Malleolus Wound Status: Open Wounding Event: Gradually Appeared Date Acquired: 05/12/2021 Weeks Of Treatment: 25 Clustered Wound: No Wound Measurements Length: (cm) 0.7 Width: (cm) 0.8 Depth: (cm) 0.2 Area: (cm) 0.44 Volume: (cm) 0.088 % Reduction in Area: 60.8% % Reduction in Volume: 60.9% Wound Description Classification: Category/Stage III Exudate Amount: Medium Exudate Type: Serosanguineous Exudate Color: red, brown Treatment Notes Wound #7 (Malleolus) Wound Laterality: Right, Lateral Cleanser Soap and Water Discharge Instruction: Gently cleanse wound with antibacterial soap, rinse and pat dry prior to dressing wounds Peri-Wound Care Topical Primary Dressing Secondary Dressing Gauze Discharge Instruction: As directed: dry, moistened with saline or moistened with Dakins Solution Secured With Compression Wrap Profore Lite LF 3 Multilayer Compression Bandaging System Discharge Instruction: Apply 3 multi-layer wrap as  prescribed. Compression Stockings Add-Ons Electronic Signature(s) Signed: 11/10/2021 1:47:42 PM By: [name] Coria RN Entered By: [name] on 11/10/2021 10:20:40 Kriesel, [name] ([id number]) -------------------------------------------------------------------------------- Vitals Details Patient Name: [name] Date of Service: 11/10/2021 10:15 AM Medical Record Number: [id number] Patient Account Number: [id number] Date of Birth/Sex: 28-Sep-1923 (87 y.o. F) Treating RN: [name] Primary Care Dyshaun Bonzo: [name] Other Clinician: Referring Oluwafemi Villella: [name] Treating Yehudah Standing/Extender: [name] in Treatment: 25 Vital Signs Time Taken: 10:11 Temperature (F): 98.5 Height (in): 62 Pulse (bpm): 78 Weight (lbs): 150 Respiratory Rate (breaths/min): 18 Body Mass Index (BMI): 27.4 Blood Pressure (mmHg): 167/87 Reference Range: 80 - 120 mg / dl Electronic Signature(s) Signed: 11/10/2021 1:47:42 PM By: [name] Coria RN Entered By: [name] on 11/10/2021 10:11:37

## 2021-11-10 NOTE — Progress Notes (Addendum)
[name], [name] ([id number]) Visit Report for 11/10/2021 Chief Complaint Document Details Patient Name: [name]. Date of Service: 11/10/2021 10:15 AM Medical Record Number: [id number] Patient Account Number: [id number] Date of Birth/Sex: [birth year] (87 y.o. F) Treating RN: Carlene Coria Primary Care Provider: Adrian Prows Other Clinician: Referring Provider: Adrian Prows Treating Provider/Extender: Skipper Cliche in Treatment: 25 Information Obtained from: Patient Chief Complaint Right foot ulcer Electronic Signature(s) Signed: 11/10/2021 10:27:37 AM By: Worthy Keeler PA-C Entered By: Worthy Keeler on 11/10/2021 10:27:37 Ehresman, [name] ([id number]) -------------------------------------------------------------------------------- Cellular or Tissue Based Product Details Patient Name: [name] Date of Service: 11/10/2021 10:15 AM Medical Record Number: [id number] Patient Account Number: [id number] Date of Birth/Sex: August 28, 1923 (87 y.o. F) Treating RN: Carlene Coria Primary Care Provider: Adrian Prows Other Clinician: Referring Provider: Adrian Prows Treating Provider/Extender: Skipper Cliche in Treatment: 25 Cellular or Tissue Based Product Type Wound #7 Right,Lateral Malleolus Applied to: Performed By: Physician Tommie Sams., PA-C Cellular or Tissue Based Product [place] Type: Level of Consciousness (Pre- Awake and Alert procedure): Pre-procedure Verification/Time Out Yes - 10:34 Taken: Location: trunk / arms / legs Wound Size (sq cm): 0.56 Product Size (sq cm): 1.6 Waste Size (sq cm): 0 Amount of Product Applied (sq cm): 1.6 Instrument Used: Forceps, Scissors Lot #: [id number] Order #: 5 Expiration Date: 08/10/2026 Fenestrated: No Reconstituted: Yes Solution Type: normal saline Solution Amount: 10 ml Lot #: [id number] Solution Expiration Date: 05/05/2023 Secured: Yes Secured With: Steri-Strips Dressing Applied:  Yes Primary Dressing: adaptic Procedural Pain: 0 Post Procedural Pain: 0 Response to Treatment: Procedure was tolerated well Level of Consciousness (Post- Awake and Alert procedure): Post Procedure Diagnosis Same as Pre-procedure Electronic Signature(s) Signed: 11/10/2021 1:47:42 PM By: Carlene Coria RN Entered By: Carlene Coria on 11/10/2021 10:42:33 Arcidiacono, [name] ([id number]) -------------------------------------------------------------------------------- HPI Details Patient Name: [name] Date of Service: 11/10/2021 10:15 AM Medical Record Number: [id number] Patient Account Number: [id number] Date of Birth/Sex: 1923/10/17 (87 y.o. 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 Dr. Amalia Hailey the podiatrist saw her on 07/20/2017 and sent her to the wound center for possible hyperbaric oxygen therapy. past medical history of peripheral vascular disease, varicose veins, status post appendectomy, basal cell carcinoma excision from the left leg, cholecystectomy, pacemaker placement, right lower extremity angiography done by Dr. dew in March 2017 with placement of a stent. there is also note of a successful ablation of the right small saphenous vein done which was reviewed by ultrasound on 10/24/2016. the patient had a right small saphenous vein ablation done on 10/20/2016. The patient has never been a smoker. She has been seen by Dr. Corene Cornea dew the vascular surgeon who most recently saw her on 06/15/2017 for evaluation of ongoing problems with right leg swelling. She had a lower extremity arterial duplex  examination done(02/13/17) which showed patent distal right superficial femoral artery stent and above-the-knee popliteal stent without evidence of restenosis. The ABI was more than 1.3 on the right and more than 1.3 on the left. This was consistent with noncompressible arteries due to medial calcification. The right great toe pressure and PPG waveforms are within normal limits and the left great toe pressure and PPG waveforms are  decreased. he recommended she continue to wear her compression stockings and continue with elevation. She is scheduled to have a noninvasive arterial study in the near future 08/16/2017 -- had a lower extremity arterial duplex examination done which showed patent distal right superficial femoral artery stent and above-the-knee popliteal stent without evidence of restenosis. The ABI was more than 1.3 on the right and more than 1.3 on the left. This was consistent with noncompressible arteries due to medial calcification. The right great toe pressure and PPG waveforms are within normal limits and the left great toe pressure and PPG waveforms are decreased. the x-ray of the right ankle has not yet been done 08/24/2017 -- had a right ankle x-ray -- IMPRESSION:1. No fracture, bone lesion or evidence of osteomyelitis. 2. Lateral soft tissue swelling with a soft tissue ulcer. she has not yet seen the vascular surgeon for review 08/31/17 on evaluation today patient's wound appears to be showing signs of improvement. She still with her appointment with vascular in order to review her results of her vascular study and then determine if any intervention would be recommended at that time. No fevers, chills, nausea, or vomiting noted at this time. She has been tolerating the dressing changes without complication. 09/28/17 on evaluation today patient's wound appears to show signs of good improvement in regard to the granulation tissue which is surfacing. There is still a layer of slough  covering the wound and the posterior portion is still significantly deeper than the anterior nonetheless there has been some good sign of things moving towards the better. She is going to go back to Dr. dew for reevaluation to ensure her blood flow is still appropriate. That will be before her next evaluation with [place] next week. No fevers, chills, nausea, or vomiting noted at this time. Patient does have some discomfort rated to be a 3-4/10 depending on activity specifically cleansing the wound makes it worse. 10/05/2017 -- the patient was seen by Dr. Lucky Cowboy last week and noninvasive studies showed a normal right ABI with brisk triphasic waveforms consistent with no arterial insufficiency including normal digital pressures. The duplex showed a patent distal right SFA stent and the proximal SFA was also normal. He was pleased with her test and thought she should have enough of perfusion for normal wound healing. He would see her back in 6 months time. 12/21/17 on evaluation today patient appears to be doing fairly well in regard to her right lateral ankle wound. Unfortunately the main issue that she is expansion at this point is that she is having some issues with what appears to be some cellulitis in the right anterior shin. She has also been noting a little bit of uncomfortable feeling especially last night and her ankle area. I'm afraid that she made the developing a little bit of an infection. With that being said I think it is in the early stages. 12/28/17 on evaluation today patient's ankle appears to be doing excellent. She's making good progress at this point the cellulitis seems to have improved after last week's evaluation. Overall she is having no significant discomfort which is excellent news. She does have an appointment with Dr. dew on March 29, 2018 for reevaluation in regard to the stent he placed. She seems to have excellent blood flow in the right lower extremity. 01/19/12 on evaluation  today patient's wound appears to be doing very well. In fact she does not appear to require debridement at this point, there's no evidence of infection, and overall  from the standpoint of the wound she seems to be doing very well. With that being said I believe that it may be time to switch to different dressing away from the [facility] Dressing she tells me she does have a lot going on her friend actually passed away yesterday and she's also having a lot of issues with her husband this obviously is weighing heavy on her as far as your thoughts and concerns today. 01/25/18 on evaluation today patient appears to be doing fairly well in regard to her right lateral malleolus. She has been tolerating the dressing changes without complication. Overall I feel like this is definitely showing signs of improvement as far as how the overall appearance of the wound is there's also evidence of epithelium start to migrate over the granulation tissue. In general I think that she is progressing nicely as far as the wound is concerned. The only concern she really has is whether or not we can switch to every other week visits in order to avoid having as many appointments as her daughters have a difficult time getting her to her appointments as well as the patient's husband to his he is not doing very well at this point. 02/22/18 on evaluation today patient's right lateral malleolus ulcer appears to be doing great. She has been tolerating the dressing changes without complication. Overall you making excellent progress at this time. Patient is having no significant discomfort. KENZINGTON, MIELKE ([id number]) 03/15/18 on evaluation today patient appears to be doing much more poorly in regard to her right lateral ankle ulcer at this point. Unfortunately since have last seen her her husband has passed just a few days ago is obviously weighed heavily on her her daughter also had surgery well she is with her today as  usual. There does not appear to be any evidence of infection she does seem to have significant contusion/deep tissue injury to the right lateral malleolus which was not noted previous when I saw her last. It's hard to tell of exactly when this injury occurred although during the time she was spending the night in the hospital this may have been most likely. 03/22/18 on evaluation today patient appears to actually be doing very well in regard to her ulcer. She did unfortunately have a setback which was noted last week however the good news is we seem to be getting back on track and in fact the wound in the core did still have some necrotic tissue which will be addressed at this point today but in general I'm seeing signs that things are on the up and up. She is glad to hear this obviously she's been somewhat concerned that due to the how her wound digressed more recently. 03/29/18 on evaluation today patient appears to be doing fairly well in regard to her right lower extremity lateral malleolus ulcer. She unfortunately does have a new area of pressure injury over the inferior portion where the wound has opened up a little bit larger secondary to the pressure she seems to be getting. She does tell me sometimes when she sleeps at night that it actually hurts and does seem to be pushing on the area little bit more unfortunately. There does not appear to be any evidence of infection which is good news. She has been tolerating the dressing changes without complication. She also did have some bruising in the left second and third toes due to the fact that she may have bump this or injured it although she  has neuropathy so she does not feel she did move recently that may have been where this came from. Nonetheless there does not appear to be any evidence of infection at this time. 04/12/18 on evaluation today patient's wound on the right lateral ankle actually appears to be doing a little bit better with a lot  of necrotic docking tissue centrally loosening up in clearing away. However she does have the beginnings of a deep tissue injury on the left lateral malleolus likely due to the fact we've been trying offload the right as much as we have. I think she may benefit from an assistive soft device to help with offloading and it looks like they're looking at one of the doughnut conditions that wraps around the lower leg to offload which I think will definitely do a good job. With that being said I think we definitely need to address this issue on the left before it becomes a wound. Patient is not having significant pain. 04/19/18 on evaluation today patient appears to be doing excellent in regard to the progress she's made with her right lateral ankle ulcer. The left ankle region which did show evidence of a deep tissue injury seems to be resolving there's little fluid noted underneath and a blister there's nothing open at this point in time overall I feel like this is progressing nicely which is good news. She does not seem to be having significant discomfort at this point which is also good news. 04/25/18-She is here in follow up evaluation for bilateral lateral malleolar ulcers. The right lateral malleolus ulcer with pale subcutaneous tissue exposure, central area of ulcer with tendon/periosteum exposed. The left lateral malleolus ulcer now with central area of nonviable tissue, otherwise deep tissue injury. She is wearing compression wraps to the left lower extremity, she will place the right lower extremity compression wraps on when she gets home. She will be out of town over the weekend and return next week and follow-up appointment. She completed her doxycycline this morning 05/03/18 on evaluation today patient appears to be doing very well in regard to her right lateral ankle ulcer in general. At least she's showing some signs of improvement in this regard. Unfortunately she has some additional injury  to the left lateral malleolus region which appears to be new likely even over the past several days. Again this determination is based on the overall appearance. With that being said the patient is obviously frustrated about this currently. 05/10/18-She is here in follow-up evaluation for bilateral lateral malleolar ulcers. She states she has purchased offloading shoes/boots and they will arrive tomorrow. She was asked to bring them in the office at next week's appointment so her provider is aware of product being utilized. She continues to sleep on right or left side, she has been encouraged to sleep on her back. The right lateral malleolus ulcer is precariously close to peri-osteum; will order xray. The left lateral malleolus ulcer is improved. Will switch back to santyl; she will follow up next week. 05/17/18 on evaluation today patient actually appears to be doing very well in regard to her malleolus her ulcers compared to last time I saw them. She does not seem to have as much in the way of contusion at this point which is great news. With that being said she does continue to have discomfort and I do believe that she is still continuing to benefit from the offloading/pressure reducing boots that were recommended. I think this is the key to trying  to get this to heal up completely. 05/24/18 on evaluation today patient actually appears to be doing worse at this point in time unfortunately compared to her last week's evaluation. She is having really no increased pain which is good news unfortunately she does have more maceration in your theme and noted surrounding the right lateral ankle the left lateral ankle is not really is erythematous I do not see signs of the overt cellulitis on that side. Unfortunately the wounds do not seem to have shown any signs of improvement since the last evaluation. She also has significant swelling especially on the right compared to previous some of this may be due to  infection however also think that she may be served better while she has these wounds by compression wrapping versus continuing to use the Juxta-Lite for the time being. Especially with the amount of drainage that she is experiencing at this point. No fevers, chills, nausea, or vomiting noted at this time. 05/31/18 on evaluation today patient appears to actually be doing better in regard to her right lateral lower extremity ulcer specifically on the malleolus region. She has been tolerating the antibiotic without complication. With that being said she still continues to have issues but a little bit of redness although nothing like she what she was experiencing previous. She still continues to pressure to her ankle area she did get the problem on offloading boots unfortunately she will not wear them she states there too uncomfortable and she can't get in and out of the bed. Nonetheless at this point her wounds seem to be continually getting worse which is not what we want I'm getting somewhat concerned about her progress and how things are going to proceed if we do not intervene in some way shape or form. I therefore had a very lengthy conversation today about offloading yet again and even made a specific suggestion for switching her to a memory foam mattress and even gave the information for a specific one that they could look at getting if it was something that they were interested in considering. She does not want to be considered for a hospital bed air mattress although honestly insurance would not cover it that she does not have any wounds on her trunk. 06/14/18 on evaluation today both wounds over the bilateral lateral malleolus her ulcers appear to be doing better there's no evidence of pressure injury at this point. She did get the foam mattress for her bed and this does seem to have been extremely beneficial for her in my pinion. Her daughter states that she is having difficulty getting out of  bed because of how soft it is. The patient also relates this to be. Nonetheless I do feel like she's actually doing better. Unfortunately right after and around the time she was getting the mattress she also sustained a fall when she got up to go pick up the phone and ended up injuring her right elbow she has 18 sutures in place. We are not caring for this currently although home health is going to be taking the sutures out shortly. Nonetheless this may be something that we need to evaluate going forward. It depends on how well it has or has not healed in the end. She also recently saw an orthopedic specialist for an injection in the right shoulder just before her fall unfortunately the fall seems to have worsened her pain. 06/21/18 on evaluation today patient appears to be doing about the same in regard to her lateral  malleolus ulcers. Both appear to be just a little bit deeper but again we are clinging away the necrotic and dead tissue which I think is why this is progressing towards a deeper realm as opposed MULKI, ROESLER. ([id number]) to improving from my measurement standpoint in that regard. Nonetheless she has been tolerating the dressing changes she absolutely hates the memory foam mattress topper that was obtained for her nonetheless I do believe this is still doing excellent as far as taking care of excess pressure in regard to the lateral malleolus regions. She in fact has no pressure injury that I see whereas in weeks past it was week by week I was constantly seeing new pressure injuries. Overall I think it has been very beneficial for her. 07/03/18; patient arrives in my clinic today. She has deep punched out areas over her bilateral lateral malleoli. The area on the right has some more depth. We spent a lot of time today talking about pressure relief for these areas. This started when her daughter asked for a prescription for a memory foam mattress. I have never written a prescription  for a mattress and I don't think insurances would pay for that on an ordinary bed. In any case he came up that she has foam boots that she refuses to wear. I would suggest going to these before any other offloading issues when she is in bed. They say she is meticulous about offloading this the rest of the day 07/10/18- She is seen in follow-up evaluation for bilateral, lateral malleolus ulcers. There is no improvement in the ulcers. She has purchased and is sleeping on a memory foam mattress/overlay, she has been using the offloading boots nightly over the past week. She has a follow up appointment with vascular medicine at the end of October, in my opinion this follow up should be expedited given her deterioration and suboptimal TBI results. We will order plain film xray of the left ankle as deeper structures are palpable; would consider having MRI, regardless of xray report(s). The ulcers will be treated with iodoflex/iodosorb, she is unable to safely change the dressings daily with santyl. 07/19/18 on evaluation today patient appears to be doing in general visually well in regard to her bilateral lateral malleolus ulcers. She has been tolerating the dressing changes without complication which is good news. With that being said we did have an x-ray performed on 07/12/18 which revealed a slight loosen see in the lateral portion of the distal left fibula which may represent artifact but underline lytic destruction or osteomyelitis could not be excluded. MRI was recommended. With that being said we can see about getting the patient scheduled for an MRI to further evaluate this area. In fact we have that scheduled currently for August 20 19,019. 07/26/18 on evaluation today patient's wound on the right lateral ankle actually appears to be doing fairly well at this point in my pinion. She has made some good progress currently. With that being said unfortunately in regard to the left lateral ankle ulcer this  seems to be a little bit more problematic at this time. In fact as I further evaluated the situation she actually had bone exposed which is the first time that's been the case in the bone appear to be necrotic. Currently I did review patient's note from Dr. Bunnie Domino office with [facility]. He stated that ABI was 1.26 on the right and 0.95 on the left with good waveforms. Her perfusion is stable not reduced  from previous studies and her digital waveforms were pretty good particularly on the right. His conclusion upon review of the note was that there was not much she could do to improve her perfusion and he felt she was adequate for wound healing. His suggestion was that she continued to see [place] and consider a synthetic skin graft if there was no underlying infection. He plans to see her back in six months or as needed. 08/01/18 on evaluation today patient appears to be doing better in regard to her right lateral ankle ulcer. Her left lateral ankle ulcer is about the same she still has bone involvement in evidence of necrosis. There does not appear to be evidence of infection at this time On the right lateral lower extremity. I have started her on the Augmentin she picked this up and started this yesterday. This is to get her through until she sees infectious disease which is scheduled for 08/12/18. 08/06/18 on evaluation today patient appears to be doing rather well considering my discussion with patient's daughter at the end of last week. The area which was marked where she had erythema seems to be improved and this is good news. With that being said overall the patient seems to be making good improvement when it comes to the overall appearance of the right lateral ankle ulcer although this has been slow she at least is coming around in this regard. Unfortunately in regard to the left lateral ankle ulcer this is osteomyelitis based on the pathology report as well is bone culture.  Nonetheless we are still waiting CT scan. Unfortunately the MRI we originally ordered cannot be performed as the patient is a pacemaker which I had overlooked. Nonetheless we are working on the CT scan approval and scheduling as of now. She did go to the hospital over the weekend and was placed on IV Cefzo for a couple of days. Fortunately this seems to have improved the erythema quite significantly which is good news. There does not appear to be any evidence of worsening infection at this time. She did have some bleeding after the last debridement therefore I did not perform any sharp debridement in regard to left lateral ankle at this point. Patient has been approved for a snap vac for the right lateral ankle. 08/14/18; the patient with wounds over her bilateral lateral malleoli. The area on the right actually looks quite good. Been using a snap back on this area. Healthy granulation and appears to be filling in. Unfortunately the area on the left is really problematic. 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 Also noted to have cellulitis. She saw Dr. Novella Olive of infectious disease today and was put on linezolid. We are able to verify this with her pharmacy. She is completed the Augmentin that she was already on. We've been using Iodoflex to this area 08/23/18 on evaluation today patient's wounds both actually appear to be doing better compared to my prior evaluations. Fortunately she showing signs of good improvement in regard to the overall wound status especially where were using the snap vac on the right. In regard to left lateral malleolus the wound bed actually appears to be much cleaner than previously noted. I do not feel any phone directly probed during evaluation today and though there is tendon noted this does not appear to be necrotic it's actually fairly good as far as the overall appearance of the  tendon is  concerned. In general the wound bed actually appears to be doing significantly better than it was previous. Patient is currently in the care of [name] and I did review that note today. He actually has her on two weeks of linezolid and then following the patient will be on 1-2 months of Keflex. That is the plan currently. She has been on antibiotics therapy as prescribed by myself initially starting on July 30, 2018 and has been on that continuously up to this point. 08/30/18 on evaluation today patient actually appears to be doing much better in regard to her right lateral malleolus ulcer. She has been tolerating the dressing changes specifically the snap vac without complication although she did have some issues with the seal currently. Apparently there was some trouble with getting it to maintain over the past week past Sunday. Nonetheless overall the wound appears better in regard to the right lateral malleolus region. In regard to left lateral malleolus this actually show some signs of additional granulation although there still tendon noted in the base of the wound this appears to be healthy not necrotic in any way whatsoever. We are considering potentially using a snap vac for the left lateral malleolus as well the product wrap from [employer], [place], was present in the clinic today we're going to see this patient I did have her come in with me after obtaining consent from the patient and her daughter in order to look at the wound and see if there's any recommendation one way or another as to whether or not they felt the snapback could be beneficial for the left lateral malleolus region. But the conclusion was that it might be but that this is definitely a little bit deeper wound than what traditionally would be utilized for a snap vac. 09/06/18 on evaluation today patient actually appears to be doing excellent in my pinion in regard to both ankle ulcers. She has been tolerating  the dressing changes without complication which is great news. Specifically we have been using the snap vac. In regard to the right ankle I'm not even sure that this is going to be necessary for today and following as the wound has filled in quite nicely. In regard to the left ankle I do believe SAWYER, KAHAN ([id number]) that we're seeing excellent epithelialization from the edge as well as granulation in the central portion the tendon is still exposed but there's no evidence of necrotic bone and in general I feel like the patient has made excellent progress even compared to last week with just one week of the snap vac. 09/11/18; this is a patient who has wounds on her bilateral lateral malleoli. Initially both of these were deep stage IV wounds in the setting of chronic arterial insufficiency. She has been revascularized. As I understand think she been using snap vacs to both of these wounds however the area on the right became more superficial and currently she is only using it on the left. Using silver collagen on the right and silver collagen under the back on the left I believe 09/19/18 on evaluation today patient actually appears to be doing very well in regard to her lateral malleolus or ulcers bilaterally. She has been tolerating the dressing changes without complication. Fortunately there does not appear to be any evidence of infection at this time. Overall I feel like she is improving in an excellent manner and I'm very pleased with the fact that everything seems to be turning towards the better for her.  This has obviously been a long road. 09/27/18 on evaluation today patient actually appears to be doing very well in regard to her bilateral lateral malleolus ulcers. She has been tolerating the dressing changes without complication. Fortunately there does not appear to be any evidence of infection at this time which is also great news. No fevers, chills, nausea, or vomiting noted at  this time. Overall I feel like she is doing excellent with the snap vac on the left malleolus. She had 40 mL of fluid collection over the past week. 10/04/18 on evaluation today patient actually appears to be doing well in regard to her bilateral lateral malleolus ulcers. She continues to tolerate the dressing changes without complication. One issue that I see is the snap vac on the left lateral malleolus which appears to have sealed off some fluid underlying this area and has not really allowed it to heal to the degree that I would like to see. For that reason I did suggest at this point we may want to pack a small piece of packing strip into this region to allow it to more effectively wick out fluid. 10/11/18 in general the patient today does not feel that she has been doing very well. She's been a little bit lethargic and subsequently is having bodyaches as well according to what she tells me today. With that being said overall she has been concerned with the fact that something may be worsening although to be honest her wounds really have not been appearing poorly. She does have a new ulcer on her left heel unfortunately. This may be pressure related. Nonetheless it seems to me to have potentially started at least as a blister I do not see any evidence of deep tissue injury. In regard to the left ankle the snap vac still seems to be causing the ceiling off of the deeper part of the wound which is in turn trapping fluid. I'm not extremely pleased with the overall appearance as far as progress from last week to this week therefore I'm gonna discontinue the snap vac at this point. 10/18/18 patient unfortunately this point has not been feeling well for the past several days. She was seen by Grayland Ormond her primary care provider who is a Librarian, academic at [facility]. Subsequently she states that she's been very weak and generally feeling malaise. No fevers, chills, nausea, or vomiting noted  at this time. With that being said bloodwork was performed at the PCP office on the 11th of this month which showed a white blood cell count of 10.7. This was repeated today and shows a white blood cell count of 12.4. This does show signs of worsening. Coupled with the fact that she is feeling worse and that her left ankle wound is not really showing signs of improvement I feel like this is an indication that the osteomyelitis is likely exacerbating not improving. Overall I think we may also want to check her C-reactive protein and sedimentation rate. Actually did call [facility] office this afternoon while the patient was in the office here with me. Subsequently based on the findings we discussed treatment possibilities and I think that it is appropriate for [place] to go ahead and initiate treatment with doxycycline which I'm going to do. Subsequently he did agree to see about adding a CRP and sedimentation rate to her orders. If that has not already been drawn to where they can run it they will contact the patient she can come back to have  that check. They are in agreement with plan as far as the patient and her daughter are concerned. Nonetheless also think we need to get in touch with Dr. Henreitta Leber office to see about getting the patient scheduled with him as soon as possible. 11/08/18 on evaluation today patient presents for follow-up concerning her bilateral foot and ankle ulcers. I did do an extensive review of her chart in epic today. Subsequently she was seen by [name] he did initiate Cefepime IV antibiotic therapy. Subsequently she had some issues with her PICC line this had to be removed because it was coiled and then replaced. Fortunately that was now settled. Unfortunately she has continued have issues with her left heel as well as the issues that she is experiencing with her bilateral lateral malleolus regions. I do believe however both areas seem to be doing a little bit better on  evaluation today which is good news. No fevers, chills, nausea, or vomiting noted at this time. She actually has an angiogram schedule with Dr. dew on this coming Monday, November 11, 2018. Subsequently the patient states that she is feeling much better especially than what she was roughly 2 weeks ago. She actually had to cancel an appointment because she was feeling so poorly. No fevers, chills, nausea, or vomiting noted at this time. 11/15/18 on evaluation today patient actually is status post having had her angiogram with Dr. dew Monday, four days ago. 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 He had to go and work on several areas of the vasculature fortunately he was able to obtain no more than a 30% residual stenosis throughout post procedure. I reviewed this note today. I think this will definitely help with healing at this time. Fortunately there does not appear to be any signs of infection and I do feel like ratio already has a better appearance to it. 11/22/18 upon evaluation today patient actually appears to be doing very well in regard to her wounds in general. The right lateral malleolus looks excellent the heel looks better in the left lateral malleolus also appears to be doing a little better. With that being said the right second toe actually appears to be open and training we been watching this is been dry and stable but now is open. 12/03/2018 Seen today for follow-up and management of multiple bilateral lower extremity wounds. New pressure injury of the great toe which is closed at this time. Wound of the right distal second toe appears larger today with deep undermining and a pocket of fluid present within the undermining region. Left and right malleolus is wounds are stable today with no signs and symptoms of infection.Denies any needs or concerns during exam today. 12/13/18 on evaluation today patient appears to be doing somewhat better in regard to her  left heel ulcer. She also seems to be completely healed in regard to the right lateral malleolus ulcer. The left malleolus ulcer is smaller what unfortunately the wounds which are new over the first and second toes of the right foot are what are most concerning at this point especially the second. Both areas did require sharp debridement today. 12/20/18 on evaluation today patient's wound actually appears to be doing better in regard to left lateral ankle and her right lateral ankle continues to remain healed. The hill ulcer on the left is improved. She does have improvement noted as well in regard to both toe ulcers. Overall I'm very pleased in this regard.  No fevers, chills, nausea, or vomiting noted at this time. 12/23/18 on evaluation today patient is seen after she had her toenails trimmed at the podiatrist office due to issues with her right great toe. There was what appeared to be dark eschar on the surface of the wound which had her in the podiatrist concerned. Nonetheless as I remember that during the last office visit I had utilize silver nitrate of this area I was much less concerned about the situation. Subsequently I was able to clean off much of this tissue without any complication today. This does not appear to show any signs of infection and actually look somewhat better Dunnam, [name] ([id number]) compared to last time post debridement. Her second toe on the right foot actually had callous over and there did appear still be some fluid underneath this that would require debridement today. 12/27/18 on evaluation today patient actually appears to be showing signs of improvement at all locations. Even the left lateral ankle although this is not quite as great as the other sites. Fortunately there does not appear to be any signs of infection at this time and both of her toes on the right foot seem to be showing signs of improvement which is good news and very pleased in this  regard. 01/03/19 on evaluation today patient appears to be doing better for the most part in regard to her wounds in particular. There does not appear to be any evidence of infection at this time which is good news. Fortunately there is no sign of really worsening anywhere except for the right great toe which she does have what appears to be a bruise/deep tissue injury which is very superficial and already resolving. I'm not sure where this came from I questioned her extensively and she does not recall what may have happened with this. Other than that the patient seems to be doing well even the left lateral ankle ulcer looks good and is getting smaller. 01/10/19 on evaluation today patient appears to be doing well in regard to her left heel wound and both of her toe wounds. Overall I feel like there is definitely improvement here and I'm happy in that regard. With that being said unfortunately she is having issues with the left lateral malleolus ulcer which unfortunately still has a lot of depth to it. This is gonna be a very difficult wound for [place] to be able to truly get to heal. I may want to consider some type of skin substitute to see if this would be of benefit for her. I'll discuss this with her more the next visit most likely. This was something I thought about more at the end of the visit when I was Artie out of the room and the patient had been discharged. 01/17/19 on evaluation today patient appears to be doing very well in regard to her wounds in general. She's been making excellent progress at this time. Fortunately there's no sign of infection at this time either. No fevers, chills, nausea, or vomiting noted at this time. The biggest issue is still her left lateral malleolus where it appears to be doing well and is getting smaller but still shows a small corner where this is deeper and goes down into what appears to be the joint space. Nonetheless this is taking much longer to heal although it  still looks better in smaller than previous evaluations. 01/24/19 on evaluation today patient's wounds actually appear to be doing rather well in general overall. She did  require some sharp debridement in regard to the right great toe but everything else appears to be doing excellent no debridement was even necessary. No fevers, chills, nausea, or vomiting noted at this time. 01/31/19 on evaluation today patient actually appears to be doing much better in regard to her left foot wound on the heel as well as the ankle. The right great toe appears to be a little bit worse today this had callous over and trapped a lot of fluid underneath. Fortunately there's no signs of infection at any site which is great news. 02/07/19 on evaluation today patient actually appears to be doing decently well in regard to all of her ulcers at this point. No sharp debridement was required she is a little bit of hyper granulation in regard to the left lateral ankle as well as the left heel but the hill itself is almost completely healed which is excellent news. Overall been very pleased in this regard. 02/14/19 on evaluation today patient actually appears to be doing very well in regard to her ulcers on the right first toe, left lateral malleolus, and left heel. In fact the heel is almost completely healed at this point. The patient does not show any signs of infection which is good news. Overall very pleased with how things have progressed. 04/18/19 Telehealth Evaluation During the COVID-19 National Emergency: Verbal Consent: Obtained from patient Allergies: reviewed and the active list is current. Medication changes: patient has no current medication changes. COVID-19 Screening: 1. Have you traveled internationally or on a cruise ship in the last 14 dayso No 2. Have you had contact with someone with or under investigation for COVID-19o No 3. Have you had a fever, cough, sore throat, or experiencing shortness of breatho  No on evaluation today actually did have a visit with this patient through a telehealth encounter with her home health nurse. Subsequently it was noted that the patient actually appears to be doing okay in regard to her wounds both the right great toe as well as the left lateral malleolus have shown signs of improvement although this in your theme around the left lateral malleolus there eschar coverings for both locations. The question is whether or not they are actually close and whether or not home health needs to discharge the patient or not. Nonetheless my concern is this point obviously is that without actually seeing her and being able to evaluate this directly I cannot ensure that she is completely healed which is the question that I'm being asked. 04/22/19 on evaluation today patient presents for her first evaluation since last time I saw her which was actually February 14, 2019. I did do a telehealth visit last week in which point it was questionable whether or not she may be healed and had to bring her in today for confirmation. With that being said she does seem to be doing quite well at this point which is good news. There does not appear to be any drainage in the deed I believe her wounds may be healed. Readmission: 09/04/2019 on evaluation today patient appears to be doing unfortunately somewhat more poorly in regard to her left foot ulcer secondary to a wound that began on 08/21/2019 at least when she first noticed this. Fortunately she has not had any evidence of active infection at this time. Systemically. I also do not necessarily see any evidence of infection at the blister/wound site on the first metatarsal head plantar aspect. This almost appears to be something that may have  just rubbed inappropriately causing this to breakdown. They did not want a wait too long to come in to be seen as again she had significant issues in the past with wounds that took quite a while to heal in fact it  was close to 2 years. Nonetheless this does not appear to be quite that bad but again we do need to remove some of the necrotic tissue from the surface of the wound to tell exactly the extent. She does not appear to have any significant arterial disease at this point and again her last ABIs and TBI's are recorded above in the alert section her left ABI was 1.27 with a TBI of 0.72 to the right ABI 1.08 with a TBI of 0.39. Other than this the patient has been doing quite well since I last saw her and that was in May 2020. 09/11/2019 on evaluation today patient appeared to be doing very well with regard to her plantar foot ulcer on the left. In fact this appears to be almost completely healed which is awesome. That is after just 1 week of intervention. With that being said there is no signs of active infection at this time. HOMER, PFEIFER ([id number]) 09/18/2019 on evaluation today patient actually appears to be doing excellent in fact she is completely healed based on what I am seeing at this point. Fortunately there is no signs of active infection at this time and overall patient is very pleased to hear that this area has healed so quickly. Readmission: 05/13/2021 upon evaluation today this patient presents for reevaluation here in the clinic. This is a wound that actually we previously took care of. She had 1 on the right ankle and the left the left turned out to be be harder due to to heal but nonetheless is doing great at this point as the right that has reopened and it was noted first just several weeks ago with a scab over it and came off in just the past few days. Fortunately there does not appear to be any obvious evidence of significant active infection at this time which is great news. No fevers, chills, nausea, vomiting, or diarrhea. 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 There does not appear to be any signs of this interfering in any  way with her wound. She does have swelling we previously had compression socks for her ordered but again it does not look like she wears these on a regular basis by any means. 05/26/2021 upon evaluation today patient appears to be doing well with regard to her wound which is actually showing signs of excellent improvement. There does not appear to be any signs of active infection which is great news and overall very pleased with where things stand today. No fevers, chills, nausea, vomiting, or diarrhea. 06/02/2021 upon evaluation today patient's wound actually showing signs of excellent improvement. Fortunately there does not appear to be any signs of active infection which is great news. I think the patient is making good progress with regard to her wounds in general. 06/09/2021 upon evaluation today patient appears to be doing excellent in regard to her wounds currently. Fortunately there does not appear to be any signs of active infection which is great news. No fevers, chills, nausea, vomiting, or diarrhea. Overall extremely pleased with where things stand today. I think the patient is making excellent progress. 06/16/2021 upon evaluation today patient appears to be doing well in regard to  her wound. This is going require little bit of debridement today and that was discussed with the patient. Otherwise she seems to be doing quite well and I am actually very pleased with where things stand at this point. No fevers, chills, nausea, vomiting, or diarrhea. 06/23/2021 upon evaluation today patient appears to be doing well with regard to her wounds. She has been tolerating the dressing changes without complication. Fortunately there does not appear to be any evidence of infection and she has not had air in her home which she actually lives at an assisted living that got fixed this morning. With that being said because of that her wrap has been extremely hot and bothersome for her over the past  week. 06/30/2021 upon evaluation today patient is actually making excellent progress in regard to her ankle ulcer. She has been tolerating the dressing changes without complication and overall extremely pleased with where things stand there does not appear to be any evidence of active infection which is great news. No fevers, chills, nausea, vomiting, or diarrhea. 07/07/21 upon evaluation today patients and culture on the right actually appears to be doing quite well. There does not appear to be any signs of infection and overall very pleased with where things stand today. No fevers, chills, nausea, or vomiting noted at this time. 07/14/2021 unfortunately the patient today has some evidence of deep tissue injury and pressure getting to the ankle region. Again I am not exactly sure what is going on here but this is very similar to issues that we have had in the past. I explained to the patient that she needs to be very mindful of exactly what is happening I think sleeping in bed is probably the main issue here although there could be other culprits I am not sure what else would potentially lead to this kind of a problem for her. 07/21/2021 upon evaluation today patient's wound actually showing signs of improvement compared to last week. Fortunately there does not appear to be any signs of active infection which is great news and overall very pleased with where things stand in that regard. With that being said I do believe that she is continuing to show signs of overall of getting better although I think this is still basically about what we were 2 weeks ago due to the worsening and now improvement. 07/28/2021 upon evaluation today patient appears to be doing well with regard to her wound. She does have some slough buildup on the surface of the wound which I would have to manage today. Fortunately there is no sign of active infection at this time. No fevers, chills, nausea, vomiting, or diarrhea. 08/04/2021  upon evaluation today patient appears to be doing about the same in regard to her wound. To be perfectly honest I am beginning to be a little bit concerned about the overall appearance of the wound bed. I do think possibly taking a sample right around the margin of the wound could be beneficial for her as far as identifying anything such as an inflammatory process or to be honest even a skin tag cancer type process that may be of concern here. Fortunately there does not appear to be any evidence of active infection at this time which is great news she is not having any pain also great news. 08/11/2021 upon evaluation today patient appears to be doing well with regard to her wound. The good news is I did review her biopsy results and it showed some inflammatory mixed findings  but nothing that appeared to be malignant which is great news. Overall this is more of a chronic venous stasis type issue which again is more what we have been treating. Nonetheless I just wanted to make sure before going forward that there was not anything more untoward going on at this point. 08/18/2021 upon evaluation today patient appears to be doing well with regard to her ankle ulcer. Fortunately there does not appear to be any signs of active infection at this time which is great overall wound is dramatically improved compared to last week. Since last week I have actually placed her on doxycycline and subsequently this is a good option as far as the findings are concerned at this point. 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 the doxycycline is doing that. There does not appear to be any evidence of active infection systemically which is great news. 08/25/2021 upon evaluation today patient appears to be doing well with regard to her wound. I feel like we are finally get back on track as far  as healing is concerned I am much happier with the overall appearance today. I do think that she is tolerating the dressing changes without complication which is great news. We have been using Hydrofera Blue which I think is a good option. The good news is she is also doing great in regard to her compression sock on the left which is a zipper compression that seems to be doing a great job keeping her edema under good control. 09/01/2021 upon evaluation today patient appears to be doing well with regard to her wound. Infection seems to be under much better control which is great news and very pleased in that regard. Fortunately there does not appear to be any signs of infection currently. ELBIA, PARO ([id number]) 09/08/2021 upon evaluation today patient actually appears to be making good progress in regard to her wound. She has been tolerating the dressing changes without complication. Fortunately there does not appear to be any evidence of active infection at this time which is great news as well. No fevers, chills, nausea, vomiting, or diarrhea. 09/22/2021 upon evaluation today patient appears to be doing well with regard to her wound although is very slow to heal. We have not looked into Apligraf yet I think that is something that we should see about doing. 09/29/2021 upon evaluation today patient's wound is actually showing signs of doing about the same. I am not seeing any evidence of worsening but also no significant evidence of improvement. We did gain approval for the organogenesis products all except for Apligraf as covered by her insurance. With that being said I do think that we can go ahead and proceed with the NuShield if the patient and her family in agreement of the plan I discussed that with him today she does have a 20% coinsurance which we also discussed. 10/06/2021 upon evaluation today patient appears to unfortunately be doing a little bit worse she appears to be infected  based on what I am seeing. Fortunately there does not appear to be any signs of active infection at this time which is great news. Unfortunately it does appear to be some evidence of around the wound edge indicated by way of erythema and warmth as well as redness 10/13/2021 upon evaluation today patient actually appears to be doing excellent in regard to her  ankle ulcer compared to what it was. 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 I think were fine to go ahead and apply the NuShield today. 10/20/2021 upon evaluation today patient appears to be doing excellent in fact the NuShield seems to have done all some for her thus far. I am actually very pleased with where we stand and overall I think that she is making great progress. There is no evidence of active infection at this time. 11/23; patient presents for follow-up. She has no issues or complaints today. She denies signs of infection. She reports stability in her wound healing. 11/03/2021 upon evaluation today patient appears to be doing well with regard to her wounds. She has been tolerating the dressing changes without complication. Fortunately there is no signs of active infection at this time. 11/10/2021 upon evaluation today patient appears to be doing well with regard to her right ankle which is actually showing signs of improvement with the NuShield I am very pleased. Subsequently in regards to the left ankle this appears to be doing okay with no evidence of issue here either. With that being said she has a significant contusion on the left leg from having fallen 2 days ago. She tells me that she was using her walker going to the closet and then when she got to the closet turned around to get something out at which point she fell according to  the story. Nonetheless she does have a hematoma just below her knee on the anterior portion of her shin. My hope is that this will not open until wound although we do need I think Some compression over it also think that we need to have her use an ice as well to help with the swelling and prevent this from getting worse. The last thing she needs is a wound opened up here. Electronic Signature(s) Signed: 11/10/2021 6:06:14 PM By: Worthy Keeler PA-C Entered By: Worthy Keeler on 11/10/2021 18:06:14 Fraser, [name] ([id number]) -------------------------------------------------------------------------------- Physical Exam Details Patient Name: [name] Date of Service: 11/10/2021 10:15 AM Medical Record Number: [id number] Patient Account Number: [id number] Date of Birth/Sex: 02-23-1923 (87 y.o. F) Treating RN: Carlene Coria Primary Care Provider: Adrian Prows Other Clinician: Referring Provider: Adrian Prows Treating Provider/Extender: Skipper Cliche in Treatment: 92 Constitutional Well-nourished and well-hydrated in no acute distress. Respiratory normal breathing without difficulty. Psychiatric this patient is able to make decisions and demonstrates good insight into disease process. Alert and Oriented x 3. pleasant and cooperative. Notes Upon inspection patient's wound again on the right leg seems to be doing well I did actually reapply the NuShield today and the patient tolerated this without complication. With that being said I do not see any signs of active infection which is great news. This was secured with Adaptic and Steri-Strips. With regard to her left leg the contusion area does have me concerned we will get a use Tubigrip over top of the leg in general to help with edema control and hopefully get this to reabsorb also want her to be using ice. In regard to the left lateral ankle there is nothing showing any signs of opening at this point. Electronic  Signature(s) Signed: 11/10/2021 6:07:31 PM By: Worthy Keeler PA-C Entered By: Worthy Keeler on 11/10/2021 18:07:31 Babiarz, Hulan Amato  Darnell Level ([id number]) -------------------------------------------------------------------------------- Physician Orders Details Patient Name: LTANYA, BAYLEY. Date of Service: 11/10/2021 10:15 AM Medical Record Number: [id number] Patient Account Number: [id number] Date of Birth/Sex: [birth year] (87 y.o. 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 Bathing/ Shower/ Hygiene o May shower with wound dressing protected with water repellent cover or cast protector. Cellular or Tissue Based Products o Cellular or Tissue Based Product Type: - NuShield #5 o Cellular or Tissue Based Product applied to wound bed; including contact layer, fixation with steri-strips, dry gauze and cover dressing. (DO NOT REMOVE). Edema Control - Lymphedema / Segmental Compressive Device / Other o Optional: One layer of unna paste to top of compression wrap (to act as an anchor). - and at toes o Tubigrip single layer applied. - size D to left lower leg to above the knee o Elevate, Exercise Daily and Avoid Standing for Long Periods of Time. o Elevate legs to the level of the heart and pump ankles as often as possible o Elevate leg(s) parallel to the floor when sitting. Wound Treatment Wound #7 - Malleolus Wound Laterality: Right, Lateral Cleanser: Soap and Water 1 x Per Week/30  Days Discharge Instructions: Gently cleanse wound with antibacterial soap, rinse and pat dry prior to dressing wounds Secondary Dressing: Gauze 1 x Per Week/30 Days Discharge Instructions: As directed: dry, moistened with saline or moistened with Dakins Solution Compression Wrap: Profore Lite LF 3 Multilayer Compression Bandaging System 1 x Per Week/30 Days Discharge Instructions: Apply 3 multi-layer wrap as prescribed. Electronic Signature(s) Signed: 11/10/2021 6:23:09 PM By: Worthy Keeler PA-C Entered By: Worthy Keeler on 11/10/2021 11:03:19 Okerlund, [name] ([id number]) -------------------------------------------------------------------------------- Problem List Details Patient Name: [name] Date of Service: 11/10/2021 10:15 AM Medical Record Number: [id number] Patient Account Number: [id number] Date of Birth/Sex: [birth year] (87 y.o. 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, stage 3 05/13/2021 No Yes I89.0 Lymphedema, not elsewhere classified 05/13/2021 No Yes I87.2 Venous insufficiency (chronic) (peripheral) 05/13/2021 No Yes S80.12XA Contusion of left lower leg, initial encounter 11/10/2021 No Yes Z95.0 Presence of cardiac pacemaker 05/13/2021 No Yes Z79.01 Long term (current) use of anticoagulants 05/13/2021 No Yes Inactive Problems Resolved Problems Electronic Signature(s) Signed: 11/10/2021 6:06:56 PM By: Worthy Keeler PA-C Previous Signature: 11/10/2021 10:27:33 AM Version By: Worthy Keeler PA-C Entered By: Worthy Keeler on 11/10/2021 18:06:56 Follmer, [name] ([id number]) -------------------------------------------------------------------------------- Progress Note Details Patient Name: [name] Date of Service: 11/10/2021 10:15 AM Medical Record Number: [id number] Patient Account Number: [id number] Date of Birth/Sex: [birth year] (87 y.o. 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 Dr. Amalia Hailey the podiatrist saw her on 07/20/2017 and sent her to the wound center for possible hyperbaric oxygen therapy. past medical history of peripheral vascular disease, varicose veins, status post appendectomy, basal cell carcinoma excision from the left leg, cholecystectomy, pacemaker placement, right lower extremity angiography done by Dr. dew in March 2017 with placement of a stent. there is also note of a successful ablation of the right small saphenous vein done which was reviewed by ultrasound on 10/24/2016. the patient had a right small saphenous vein ablation done on 10/20/2016. The patient has never been a smoker. She has been seen by Dr. Corene Cornea dew the vascular surgeon who most recently saw her on 06/15/2017 for evaluation of ongoing problems with right leg swelling. She had a lower extremity arterial duplex examination done(02/13/17) which showed patent distal right superficial femoral artery stent and above-the-knee popliteal stent without evidence of restenosis. The ABI was more than 1.3 on the right and more than 1.3 on the left. This was consistent with noncompressible arteries due to medial calcification. The right great toe pressure and PPG waveforms are within normal limits and the left great toe pressure and PPG waveforms are  decreased. he recommended she continue to wear her compression stockings and continue with elevation. She is scheduled to have a noninvasive arterial study in the near future 08/16/2017 -- had a lower extremity arterial duplex examination done which showed patent distal right superficial femoral artery stent and above-the-knee popliteal stent without evidence of restenosis. The ABI was more than 1.3 on the right and more than 1.3 on the left. This was consistent with noncompressible arteries due to medial calcification. The right great toe pressure and PPG waveforms are within normal limits and the left great toe pressure and PPG waveforms are decreased. the x-ray of the right ankle has not yet been done 08/24/2017 -- had a right ankle x-ray -- IMPRESSION:1. No fracture, bone lesion or evidence of osteomyelitis. 2. Lateral soft tissue swelling with a soft tissue ulcer. she has not yet seen the vascular surgeon for review 08/31/17 on evaluation today patient's wound appears to be showing signs of improvement. She still with her appointment with vascular in order to review her results of her vascular study and then determine if any intervention would be recommended at that time. No fevers, chills, nausea, or vomiting noted at this time. She has been tolerating the dressing changes without complication. 09/28/17 on evaluation today patient's wound appears to show signs of good improvement in regard to the granulation tissue which is surfacing. There is still a layer of slough covering the wound and the posterior portion is still significantly deeper than the anterior nonetheless there has been some good sign of things moving towards the better. She is going to go back to Dr. dew for reevaluation to ensure her blood flow is still appropriate. That will be before her next evaluation with [place] next week. No fevers, chills, nausea, or vomiting noted at this time. Patient does have some discomfort rated to be  a 3-4/10 depending on activity specifically cleansing the wound makes it worse. 10/05/2017 -- the patient was seen by Dr. Lucky Cowboy last week and noninvasive studies showed a normal right ABI with brisk triphasic waveforms consistent with no arterial insufficiency including normal digital pressures. The duplex showed a patent distal right SFA stent and the proximal SFA was also normal. He was pleased with her test and thought she should have enough of perfusion for normal wound healing. He  would see her back in 6 months time. 12/21/17 on evaluation today patient appears to be doing fairly well in regard to her right lateral ankle wound. Unfortunately the main issue that she is expansion at this point is that she is having some issues with what appears to be some cellulitis in the right anterior shin. She has also been noting a little bit of uncomfortable feeling especially last night and her ankle area. I'm afraid that she made the developing a little bit of an infection. With that being said I think it is in the early stages. 12/28/17 on evaluation today patient's ankle appears to be doing excellent. She's making good progress at this point the cellulitis seems to have improved after last week's evaluation. Overall she is having no significant discomfort which is excellent news. She does have an appointment with Dr. dew on March 29, 2018 for reevaluation in regard to the stent he placed. She seems to have excellent blood flow in the right lower extremity. 01/19/12 on evaluation today patient's wound appears to be doing very well. In fact she does not appear to require debridement at this point, there's no evidence of infection, and overall from the standpoint of the wound she seems to be doing very well. With that being said I believe that it may be time to switch to different dressing away from the [facility] she tells me she does have a lot going on her friend actually passed away yesterday and  she's also having a lot of issues with her husband this obviously is weighing heavy on her as far as your thoughts and concerns today. 01/25/18 on evaluation today patient appears to be doing fairly well in regard to her right lateral malleolus. She has been tolerating the dressing changes without complication. Overall I feel like this is definitely showing signs of improvement as far as how the overall appearance of the wound is there's also evidence of epithelium start to migrate over the granulation tissue. In general I think that she is progressing nicely as far as the wound is concerned. The only concern she really has is whether or not we can switch to every other week visits in order to avoid having as many LAJUANDA, PENICK ([id number]) appointments as her daughters have a difficult time getting her to her appointments as well as the patient's husband to his he is not doing very well at this point. 02/22/18 on evaluation today patient's right lateral malleolus ulcer appears to be doing great. She has been tolerating the dressing changes without complication. Overall you making excellent progress at this time. Patient is having no significant discomfort. 03/15/18 on evaluation today patient appears to be doing much more poorly in regard to her right lateral ankle ulcer at this point. Unfortunately since have last seen her her husband has passed just a few days ago is obviously weighed heavily on her her daughter also had surgery well she is with her today as usual. There does not appear to be any evidence of infection she does seem to have significant contusion/deep tissue injury to the right lateral malleolus which was not noted previous when I saw her last. It's hard to tell of exactly when this injury occurred although during the time she was spending the night in the hospital this may have been most likely. 03/22/18 on evaluation today patient appears to actually be doing very well in regard  to her ulcer. She did unfortunately have a setback which was noted  last week however the good news is we seem to be getting back on track and in fact the wound in the core did still have some necrotic tissue which will be addressed at this point today but in general I'm seeing signs that things are on the up and up. She is glad to hear this obviously she's been somewhat concerned that due to the how her wound digressed more recently. 03/29/18 on evaluation today patient appears to be doing fairly well in regard to her right lower extremity lateral malleolus ulcer. She unfortunately does have a new area of pressure injury over the inferior portion where the wound has opened up a little bit larger secondary to the pressure she seems to be getting. She does tell me sometimes when she sleeps at night that it actually hurts and does seem to be pushing on the area little bit more unfortunately. There does not appear to be any evidence of infection which is good news. She has been tolerating the dressing changes without complication. She also did have some bruising in the left second and third toes due to the fact that she may have bump this or injured it although she has neuropathy so she does not feel she did move recently that may have been where this came from. Nonetheless there does not appear to be any evidence of infection at this time. 04/12/18 on evaluation today patient's wound on the right lateral ankle actually appears to be doing a little bit better with a lot of necrotic docking tissue centrally loosening up in clearing away. However she does have the beginnings of a deep tissue injury on the left lateral malleolus likely due to the fact we've been trying offload the right as much as we have. I think she may benefit from an assistive soft device to help with offloading and it looks like they're looking at one of the doughnut conditions that wraps around the lower leg to offload which I think will  definitely do a good job. With that being said I think we definitely need to address this issue on the left before it becomes a wound. Patient is not having significant pain. 04/19/18 on evaluation today patient appears to be doing excellent in regard to the progress she's made with her right lateral ankle ulcer. The left ankle region which did show evidence of a deep tissue injury seems to be resolving there's little fluid noted underneath and a blister there's nothing open at this point in time overall I feel like this is progressing nicely which is good news. She does not seem to be having significant discomfort at this point which is also good news. 04/25/18-She is here in follow up evaluation for bilateral lateral malleolar ulcers. The right lateral malleolus ulcer with pale subcutaneous tissue exposure, central area of ulcer with tendon/periosteum exposed. The left lateral malleolus ulcer now with central area of nonviable tissue, otherwise deep tissue injury. She is wearing compression wraps to the left lower extremity, she will place the right lower extremity compression wraps on when she gets home. She will be out of town over the weekend and return next week and follow-up appointment. She completed her doxycycline this morning 05/03/18 on evaluation today patient appears to be doing very well in regard to her right lateral ankle ulcer in general. At least she's showing some signs of improvement in this regard. Unfortunately she has some additional injury to the left lateral malleolus region which appears to be new likely even  over the past several days. Again this determination is based on the overall appearance. With that being said the patient is obviously frustrated about this currently. 05/10/18-She is here in follow-up evaluation for bilateral lateral malleolar ulcers. She states she has purchased offloading shoes/boots and they will arrive tomorrow. She was asked to bring them in the  office at next week's appointment so her provider is aware of product being utilized. She continues to sleep on right or left side, she has been encouraged to sleep on her back. The right lateral malleolus ulcer is precariously close to peri-osteum; will order xray. The left lateral malleolus ulcer is improved. Will switch back to santyl; she will follow up next week. 05/17/18 on evaluation today patient actually appears to be doing very well in regard to her malleolus her ulcers compared to last time I saw them. She does not seem to have as much in the way of contusion at this point which is great news. With that being said she does continue to have discomfort and I do believe that she is still continuing to benefit from the offloading/pressure reducing boots that were recommended. I think this is the key to trying to get this to heal up completely. 05/24/18 on evaluation today patient actually appears to be doing worse at this point in time unfortunately compared to her last week's evaluation. She is having really no increased pain which is good news unfortunately she does have more maceration in your theme and noted surrounding the right lateral ankle the left lateral ankle is not really is erythematous I do not see signs of the overt cellulitis on that side. Unfortunately the wounds do not seem to have shown any signs of improvement since the last evaluation. She also has significant swelling especially on the right compared to previous some of this may be due to infection however also think that she may be served better while she has these wounds by compression wrapping versus continuing to use the Juxta-Lite for the time being. Especially with the amount of drainage that she is experiencing at this point. No fevers, chills, nausea, or vomiting noted at this time. 05/31/18 on evaluation today patient appears to actually be doing better in regard to her right lateral lower extremity ulcer specifically  on the malleolus region. She has been tolerating the antibiotic without complication. With that being said she still continues to have issues but a little bit of redness although nothing like she what she was experiencing previous. She still continues to pressure to her ankle area she did get the problem on offloading boots unfortunately she will not wear them she states there too uncomfortable and she can't get in and out of the bed. Nonetheless at this point her wounds seem to be continually getting worse which is not what we want I'm getting somewhat concerned about her progress and how things are going to proceed if we do not intervene in some way shape or form. I therefore had a very lengthy conversation today about offloading yet again and even made a specific suggestion for switching her to a memory foam mattress and even gave the information for a specific one that they could look at getting if it was something that they were interested in considering. She does not want to be considered for a hospital bed air mattress although honestly insurance would not cover it that she does not have any wounds on her trunk. 06/14/18 on evaluation today both wounds over the bilateral lateral  malleolus her ulcers appear to be doing better there's no evidence of pressure injury at this point. She did get the foam mattress for her bed and this does seem to have been extremely beneficial for her in my pinion. Her daughter states that she is having difficulty getting out of bed because of how soft it is. The patient also relates this to be. Nonetheless I do feel like she's actually doing better. Unfortunately right after and around the time she was getting the mattress she also sustained a fall when she got up to go pick up the phone and ended up injuring her right elbow she has 18 sutures [address] place. We are not caring for this currently although home health is going to be taking the sutures out shortly. Nonetheless  this may be something that we need to evaluate going forward. It depends on AMEA, MCPHAIL ([id number]) how well it has or has not healed in the end. She also recently saw an orthopedic specialist for an injection in the right shoulder just before her fall unfortunately the fall seems to have worsened her pain. 06/21/18 on evaluation today patient appears to be doing about the same in regard to her lateral malleolus ulcers. Both appear to be just a little bit deeper but again we are clinging away the necrotic and dead tissue which I think is why this is progressing towards a deeper realm as opposed to improving from my measurement standpoint in that regard. Nonetheless she has been tolerating the dressing changes she absolutely hates the memory foam mattress topper that was obtained for her nonetheless I do believe this is still doing excellent as far as taking care of excess pressure in regard to the lateral malleolus regions. She in fact has no pressure injury that I see whereas in weeks past it was week by week I was constantly seeing new pressure injuries. Overall I think it has been very beneficial for her. 07/03/18; patient arrives in my clinic today. She has deep punched out areas over her bilateral lateral malleoli. The area on the right has some more depth. We spent a lot of time today talking about pressure relief for these areas. This started when her daughter asked for a prescription for a memory foam mattress. I have never written a prescription for a mattress and I don't think insurances would pay for that on an ordinary bed. In any case he came up that she has foam boots that she refuses to wear. I would suggest going to these before any other offloading issues when she is in bed. They say she is meticulous about offloading this the rest of the day 07/10/18- She is seen in follow-up evaluation for bilateral, lateral malleolus ulcers. There is no improvement in the ulcers. She has  purchased and is sleeping on a memory foam mattress/overlay, she has been using the offloading boots nightly over the past week. She has a follow up appointment with vascular medicine at the end of October, in my opinion this follow up should be expedited given her deterioration and suboptimal TBI results. We will order plain film xray of the left ankle as deeper structures are palpable; would consider having MRI, regardless of xray report(s). The ulcers will be treated with iodoflex/iodosorb, she is unable to safely change the dressings daily with santyl. 07/19/18 on evaluation today patient appears to be doing in general visually well in regard to her bilateral lateral malleolus ulcers. She has been tolerating the dressing changes without complication  which is good news. With that being said we did have an x-ray performed on 07/12/18 which revealed a slight loosen see in the lateral portion of the distal left fibula which may represent artifact but underline lytic destruction or osteomyelitis could not be excluded. MRI was recommended. With that being said we can see about getting the patient scheduled for an MRI to further evaluate this area. In fact we have that scheduled currently for August 20 19,019. 07/26/18 on evaluation today patient's wound on the right lateral ankle actually appears to be doing fairly well at this point in my pinion. She has made some good progress currently. With that being said unfortunately in regard to the left lateral ankle ulcer this seems to be a little bit more problematic at this time. In fact as I further evaluated the situation she actually had bone exposed which is the first time that's been the case in the bone appear to be necrotic. Currently I did review patient's note from Dr. Bunnie Domino office with [facility]. He stated that ABI was 1.26 on the right and 0.95 on the left with good waveforms. Her perfusion is stable not reduced from previous  studies and her digital waveforms were pretty good particularly on the right. His conclusion upon review of the note was that there was not much she could do to improve her perfusion and he felt she was adequate for wound healing. His suggestion was that she continued to see [place] and consider a synthetic skin graft if there was no underlying infection. He plans to see her back in six months or as needed. 08/01/18 on evaluation today patient appears to be doing better in regard to her right lateral ankle ulcer. Her left lateral ankle ulcer is about the same she still has bone involvement in evidence of necrosis. There does not appear to be evidence of infection at this time On the right lateral lower extremity. I have started her on the Augmentin she picked this up and started this yesterday. This is to get her through until she sees infectious disease which is scheduled for 08/12/18. 08/06/18 on evaluation today patient appears to be doing rather well considering my discussion with patient's daughter at the end of last week. The area which was marked where she had erythema seems to be improved and this is good news. With that being said overall the patient seems to be making good improvement when it comes to the overall appearance of the right lateral ankle ulcer although this has been slow she at least is coming around in this regard. Unfortunately in regard to the left lateral ankle ulcer this is osteomyelitis based on the pathology report as well is bone culture. Nonetheless we are still waiting CT scan. Unfortunately the MRI we originally ordered cannot be performed as the patient is a pacemaker which I had overlooked. Nonetheless we are working on the CT scan approval and scheduling as of now. She did go to the hospital over the weekend and was placed on IV Cefzo for a couple of days. Fortunately this seems to have improved the erythema quite significantly which is good news. There does not appear to be  any evidence of worsening infection at this time. She did have some bleeding after the last debridement therefore I did not perform any sharp debridement in regard to left lateral ankle at this point. Patient has been approved for a snap vac for the right lateral ankle. 08/14/18; the patient with  wounds over her bilateral lateral malleoli. The area on the right actually looks quite good. Been using a snap back on this area. Healthy granulation and appears to be filling in. Unfortunately the area on the left is really problematic. 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 Also noted to have cellulitis. She saw Dr. Novella Olive of infectious disease today and was put on linezolid. We are able to verify this with her pharmacy. She is completed the Augmentin that she was already on. We've been using Iodoflex to this area 08/23/18 on evaluation today patient's wounds both actually appear to be doing better compared to my prior evaluations. Fortunately she showing signs of good improvement in regard to the overall wound status especially where were using the snap vac on the right. In regard to left lateral malleolus the wound bed actually appears to be much cleaner than previously noted. I do not feel any phone directly probed during evaluation today and though there is tendon noted this does not appear to be necrotic it's actually fairly good as far as the overall appearance of the tendon is concerned. In general the wound bed actually appears to be doing significantly better than it was previous. Patient is currently in the care of [name] and I did review that note today. He actually has her on two weeks of linezolid and then following the patient will be on 1-2 months of Keflex. That is the plan currently. She has been on antibiotics therapy as prescribed by myself initially starting on July 30, 2018 and has been on that  continuously up to this point. 08/30/18 on evaluation today patient actually appears to be doing much better in regard to her right lateral malleolus ulcer. She has been tolerating the dressing changes specifically the snap vac without complication although she did have some issues with the seal currently. Apparently there was some trouble with getting it to maintain over the past week past Sunday. Nonetheless overall the wound appears better in regard to the right lateral malleolus region. In regard to left lateral malleolus this actually show some signs of additional granulation although there still tendon noted in the base of the wound this appears to be healthy not necrotic in any way whatsoever. We are considering potentially using a snap vac for the left lateral malleolus as well the product wrap from [employer], [place], was present in the clinic today we're going to see this patient I did have her come in with me after obtaining consent from the patient and her daughter in order to look at the wound and see if there's any recommendation one way or another as to whether or not they felt the snapback could be beneficial for the left lateral malleolus region. But MADOLIN, TWADDLE ([id number]) the conclusion was that it might be but that this is definitely a little bit deeper wound than what traditionally would be utilized for a snap vac. 09/06/18 on evaluation today patient actually appears to be doing excellent in my pinion in regard to both ankle ulcers. She has been tolerating the dressing changes without complication which is great news. Specifically we have been using the snap vac. In regard to the right ankle I'm not even sure that this is going to be necessary for today and following as the wound has filled in quite nicely. In regard to the left ankle I do believe that we're seeing excellent epithelialization from  the edge as well as granulation in the central portion the tendon is still exposed  but there's no evidence of necrotic bone and in general I feel like the patient has made excellent progress even compared to last week with just one week of the snap vac. 09/11/18; this is a patient who has wounds on her bilateral lateral malleoli. Initially both of these were deep stage IV wounds in the setting of chronic arterial insufficiency. She has been revascularized. As I understand think she been using snap vacs to both of these wounds however the area on the right became more superficial and currently she is only using it on the left. Using silver collagen on the right and silver collagen under the back on the left I believe 09/19/18 on evaluation today patient actually appears to be doing very well in regard to her lateral malleolus or ulcers bilaterally. She has been tolerating the dressing changes without complication. Fortunately there does not appear to be any evidence of infection at this time. Overall I feel like she is improving in an excellent manner and I'm very pleased with the fact that everything seems to be turning towards the better for her. This has obviously been a long road. 09/27/18 on evaluation today patient actually appears to be doing very well in regard to her bilateral lateral malleolus ulcers. She has been tolerating the dressing changes without complication. Fortunately there does not appear to be any evidence of infection at this time which is also great news. No fevers, chills, nausea, or vomiting noted at this time. Overall I feel like she is doing excellent with the snap vac on the left malleolus. She had 40 mL of fluid collection over the past week. 10/04/18 on evaluation today patient actually appears to be doing well in regard to her bilateral lateral malleolus ulcers. She continues to tolerate the dressing changes without complication. One issue that I see is the snap vac on the left lateral malleolus which appears to have sealed off some fluid underlying  this area and has not really allowed it to heal to the degree that I would like to see. For that reason I did suggest at this point we may want to pack a small piece of packing strip into this region to allow it to more effectively wick out fluid. 10/11/18 in general the patient today does not feel that she has been doing very well. She's been a little bit lethargic and subsequently is having bodyaches as well according to what she tells me today. With that being said overall she has been concerned with the fact that something may be worsening although to be honest her wounds really have not been appearing poorly. She does have a new ulcer on her left heel unfortunately. This may be pressure related. Nonetheless it seems to me to have potentially started at least as a blister I do not see any evidence of deep tissue injury. In regard to the left ankle the snap vac still seems to be causing the ceiling off of the deeper part of the wound which is in turn trapping fluid. I'm not extremely pleased with the overall appearance as far as progress from last week to this week therefore I'm gonna discontinue the snap vac at this point. 10/18/18 patient unfortunately this point has not been feeling well for the past several days. She was seen by Grayland Ormond her primary care provider who is a Librarian, academic at [facility]. Subsequently she states that she's  been very weak and generally feeling malaise. No fevers, chills, nausea, or vomiting noted at this time. With that being said bloodwork was performed at the PCP office on the 11th of this month which showed a white blood cell count of 10.7. This was repeated today and shows a white blood cell count of 12.4. This does show signs of worsening. Coupled with the fact that she is feeling worse and that her left ankle wound is not really showing signs of improvement I feel like this is an indication that the osteomyelitis is likely exacerbating not  improving. Overall I think we may also want to check her C-reactive protein and sedimentation rate. Actually did call [facility] Fleet office this afternoon while the patient was in the office here with me. Subsequently based on the findings we discussed treatment possibilities and I think that it is appropriate for [place] to go ahead and initiate treatment with doxycycline which I'm going to do. Subsequently he did agree to see about adding a CRP and sedimentation rate to her orders. If that has not already been drawn to where they can run it they will contact the patient she can come back to have that check. They are in agreement with plan as far as the patient and her daughter are concerned. Nonetheless also think we need to get in touch with Dr. Henreitta Leber office to see about getting the patient scheduled with him as soon as possible. 11/08/18 on evaluation today patient presents for follow-up concerning her bilateral foot and ankle ulcers. I did do an extensive review of her chart in epic today. Subsequently she was seen by [name] he did initiate Cefepime IV antibiotic therapy. Subsequently she had some issues with her PICC line this had to be removed because it was coiled and then replaced. Fortunately that was now settled. Unfortunately she has continued have issues with her left heel as well as the issues that she is experiencing with her bilateral lateral malleolus regions. I do believe however both areas seem to be doing a little bit better on evaluation today which is good news. No fevers, chills, nausea, or vomiting noted at this time. She actually has an angiogram schedule with Dr. dew on this coming Monday, November 11, 2018. Subsequently the patient states that she is feeling much better especially than what she was roughly 2 weeks ago. She actually had to cancel an appointment because she was feeling so poorly. No fevers, chills, nausea, or vomiting noted at this time. 11/15/18 on evaluation  today patient actually is status post having had her angiogram with Dr. dew Monday, four days ago. 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 He had to go and work on several areas of the vasculature fortunately he was able to obtain no more than a 30% residual stenosis throughout post procedure. I reviewed this note today. I think this will definitely help with healing at this time. Fortunately there does not appear to be any signs of infection and I do feel like ratio already has a better appearance to it. 11/22/18 upon evaluation today patient actually appears to be doing very well in regard to her wounds in general. The right lateral malleolus looks excellent the heel looks better in the left lateral malleolus also appears to be doing a little better. With that being said the right second toe actually appears to be open and training we been watching this is been dry and stable but now  is open. 12/03/2018 Seen today for follow-up and management of multiple bilateral lower extremity wounds. New pressure injury of the great toe which is closed at this time. Wound of the right distal second toe appears larger today with deep undermining and a pocket of fluid present within the undermining region. Left and right malleolus is wounds are stable today with no signs and symptoms of infection.Denies any needs or concerns during exam today. 12/13/18 on evaluation today patient appears to be doing somewhat better in regard to her left heel ulcer. She also seems to be completely healed in regard to the right lateral malleolus ulcer. The left malleolus ulcer is smaller what unfortunately the wounds which are new over the first and second toes of the right foot are what are most concerning at this point especially the second. Both areas did require sharp debridement today. 12/20/18 on evaluation today patient's wound actually appears to be doing better in regard to left lateral ankle and  her right lateral ankle continues to remain healed. The hill ulcer on the left is improved. She does have improvement noted as well in regard to both toe ulcers. Overall I'm very pleased in this regard. No fevers, chills, nausea, or vomiting noted at this time. PATRICA, MENDELL ([id number]) 12/23/18 on evaluation today patient is seen after she had her toenails trimmed at the podiatrist office due to issues with her right great toe. There was what appeared to be dark eschar on the surface of the wound which had her in the podiatrist concerned. Nonetheless as I remember that during the last office visit I had utilize silver nitrate of this area I was much less concerned about the situation. Subsequently I was able to clean off much of this tissue without any complication today. This does not appear to show any signs of infection and actually look somewhat better compared to last time post debridement. Her second toe on the right foot actually had callous over and there did appear still be some fluid underneath this that would require debridement today. 12/27/18 on evaluation today patient actually appears to be showing signs of improvement at all locations. Even the left lateral ankle although this is not quite as great as the other sites. Fortunately there does not appear to be any signs of infection at this time and both of her toes on the right foot seem to be showing signs of improvement which is good news and very pleased in this regard. 01/03/19 on evaluation today patient appears to be doing better for the most part in regard to her wounds in particular. There does not appear to be any evidence of infection at this time which is good news. Fortunately there is no sign of really worsening anywhere except for the right great toe which she does have what appears to be a bruise/deep tissue injury which is very superficial and already resolving. I'm not sure where this came from I questioned her  extensively and she does not recall what may have happened with this. Other than that the patient seems to be doing well even the left lateral ankle ulcer looks good and is getting smaller. 01/10/19 on evaluation today patient appears to be doing well in regard to her left heel wound and both of her toe wounds. Overall I feel like there is definitely improvement here and I'm happy in that regard. With that being said unfortunately she is having issues with the left lateral malleolus ulcer which unfortunately still has a lot  of depth to it. This is gonna be a very difficult wound for [place] to be able to truly get to heal. I may want to consider some type of skin substitute to see if this would be of benefit for her. I'll discuss this with her more the next visit most likely. This was something I thought about more at the end of the visit when I was Artie out of the room and the patient had been discharged. 01/17/19 on evaluation today patient appears to be doing very well in regard to her wounds in general. She's been making excellent progress at this time. Fortunately there's no sign of infection at this time either. No fevers, chills, nausea, or vomiting noted at this time. The biggest issue is still her left lateral malleolus where it appears to be doing well and is getting smaller but still shows a small corner where this is deeper and goes down into what appears to be the joint space. Nonetheless this is taking much longer to heal although it still looks better in smaller than previous evaluations. 01/24/19 on evaluation today patient's wounds actually appear to be doing rather well in general overall. She did require some sharp debridement in regard to the right great toe but everything else appears to be doing excellent no debridement was even necessary. No fevers, chills, nausea, or vomiting noted at this time. 01/31/19 on evaluation today patient actually appears to be doing much better in regard to  her left foot wound on the heel as well as the ankle. The right great toe appears to be a little bit worse today this had callous over and trapped a lot of fluid underneath. Fortunately there's no signs of infection at any site which is great news. 02/07/19 on evaluation today patient actually appears to be doing decently well in regard to all of her ulcers at this point. No sharp debridement was required she is a little bit of hyper granulation in regard to the left lateral ankle as well as the left heel but the hill itself is almost completely healed which is excellent news. Overall been very pleased in this regard. 02/14/19 on evaluation today patient actually appears to be doing very well in regard to her ulcers on the right first toe, left lateral malleolus, and left heel. In fact the heel is almost completely healed at this point. The patient does not show any signs of infection which is good news. Overall very pleased with how things have progressed. 04/18/19 Telehealth Evaluation During the COVID-19 National Emergency: Verbal Consent: Obtained from patient Allergies: reviewed and the active list is current. Medication changes: patient has no current medication changes. COVID-19 Screening: 1. Have you traveled internationally or on a cruise ship in the last 14 dayso No 2. Have you had contact with someone with or under investigation for COVID-19o No 3. Have you had a fever, cough, sore throat, or experiencing shortness of breatho No on evaluation today actually did have a visit with this patient through a telehealth encounter with her home health nurse. Subsequently it was noted that the patient actually appears to be doing okay in regard to her wounds both the right great toe as well as the left lateral malleolus have shown signs of improvement although this in your theme around the left lateral malleolus there eschar coverings for both locations. The question is whether or not they are  actually close and whether or not home health needs to discharge the patient or not. Nonetheless  my concern is this point obviously is that without actually seeing her and being able to evaluate this directly I cannot ensure that she is completely healed which is the question that I'm being asked. 04/22/19 on evaluation today patient presents for her first evaluation since last time I saw her which was actually February 14, 2019. I did do a telehealth visit last week in which point it was questionable whether or not she may be healed and had to bring her in today for confirmation. With that being said she does seem to be doing quite well at this point which is good news. There does not appear to be any drainage in the deed I believe her wounds may be healed. Readmission: 09/04/2019 on evaluation today patient appears to be doing unfortunately somewhat more poorly in regard to her left foot ulcer secondary to a wound that began on 08/21/2019 at least when she first noticed this. Fortunately she has not had any evidence of active infection at this time. Systemically. I also do not necessarily see any evidence of infection at the blister/wound site on the first metatarsal head plantar aspect. This almost appears to be something that may have just rubbed inappropriately causing this to breakdown. They did not want a wait too long to come in to be seen as again she had significant issues in the past with wounds that took quite a while to heal in fact it was close to 2 years. Nonetheless this does not appear to be quite that bad but again we do need to remove some of the necrotic tissue from the surface of the wound to tell exactly the extent. She does not appear to have any significant arterial disease at this point and again her last ABIs and TBI's are recorded above in the alert section her left ABI was 1.27 with a TBI of 0.72 to the right ABI 1.08 with a TBI of 0.39. Other than this the patient has been doing  quite NAFEESAH, LAPAGLIA ([id number]) well since I last saw her and that was in May 2020. 09/11/2019 on evaluation today patient appeared to be doing very well with regard to her plantar foot ulcer on the left. In fact this appears to be almost completely healed which is awesome. That is after just 1 week of intervention. With that being said there is no signs of active infection at this time. 09/18/2019 on evaluation today patient actually appears to be doing excellent in fact she is completely healed based on what I am seeing at this point. Fortunately there is no signs of active infection at this time and overall patient is very pleased to hear that this area has healed so quickly. Readmission: 05/13/2021 upon evaluation today this patient presents for reevaluation here in the clinic. This is a wound that actually we previously took care of. She had 1 on the right ankle and the left the left turned out to be be harder due to to heal but nonetheless is doing great at this point as the right that has reopened and it was noted first just several weeks ago with a scab over it and came off in just the past few days. Fortunately there does not appear to be any obvious evidence of significant active infection at this time which is great news. No fevers, chills, nausea, vomiting, or diarrhea. 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 There does not appear to be any signs  of this interfering in any way with her wound. She does have swelling we previously had compression socks for her ordered but again it does not look like she wears these on a regular basis by any means. 05/26/2021 upon evaluation today patient appears to be doing well with regard to her wound which is actually showing signs of excellent improvement. There does not appear to be any signs of active infection which is great news and overall very pleased with where things stand today. No  fevers, chills, nausea, vomiting, or diarrhea. 06/02/2021 upon evaluation today patient's wound actually showing signs of excellent improvement. Fortunately there does not appear to be any signs of active infection which is great news. I think the patient is making good progress with regard to her wounds in general. 06/09/2021 upon evaluation today patient appears to be doing excellent in regard to her wounds currently. Fortunately there does not appear to be any signs of active infection which is great news. No fevers, chills, nausea, vomiting, or diarrhea. Overall extremely pleased with where things stand today. I think the patient is making excellent progress. 06/16/2021 upon evaluation today patient appears to be doing well in regard to her wound. This is going require little bit of debridement today and that was discussed with the patient. Otherwise she seems to be doing quite well and I am actually very pleased with where things stand at this point. No fevers, chills, nausea, vomiting, or diarrhea. 06/23/2021 upon evaluation today patient appears to be doing well with regard to her wounds. She has been tolerating the dressing changes without complication. Fortunately there does not appear to be any evidence of infection and she has not had air in her home which she actually lives at an assisted living that got fixed this morning. With that being said because of that her wrap has been extremely hot and bothersome for her over the past week. 06/30/2021 upon evaluation today patient is actually making excellent progress in regard to her ankle ulcer. She has been tolerating the dressing changes without complication and overall extremely pleased with where things stand there does not appear to be any evidence of active infection which is great news. No fevers, chills, nausea, vomiting, or diarrhea. 07/07/21 upon evaluation today patients and culture on the right actually appears to be doing quite well.  There does not appear to be any signs of infection and overall very pleased with where things stand today. No fevers, chills, nausea, or vomiting noted at this time. 07/14/2021 unfortunately the patient today has some evidence of deep tissue injury and pressure getting to the ankle region. Again I am not exactly sure what is going on here but this is very similar to issues that we have had in the past. I explained to the patient that she needs to be very mindful of exactly what is happening I think sleeping in bed is probably the main issue here although there could be other culprits I am not sure what else would potentially lead to this kind of a problem for her. 07/21/2021 upon evaluation today patient's wound actually showing signs of improvement compared to last week. Fortunately there does not appear to be any signs of active infection which is great news and overall very pleased with where things stand in that regard. With that being said I do believe that she is continuing to show signs of overall of getting better although I think this is still basically about what we were 2 weeks ago  due to the worsening and now improvement. 07/28/2021 upon evaluation today patient appears to be doing well with regard to her wound. She does have some slough buildup on the surface of the wound which I would have to manage today. Fortunately there is no sign of active infection at this time. No fevers, chills, nausea, vomiting, or diarrhea. 08/04/2021 upon evaluation today patient appears to be doing about the same in regard to her wound. To be perfectly honest I am beginning to be a little bit concerned about the overall appearance of the wound bed. I do think possibly taking a sample right around the margin of the wound could be beneficial for her as far as identifying anything such as an inflammatory process or to be honest even a skin tag cancer type process that may be of concern here. Fortunately there does  not appear to be any evidence of active infection at this time which is great news she is not having any pain also great news. 08/11/2021 upon evaluation today patient appears to be doing well with regard to her wound. The good news is I did review her biopsy results and it showed some inflammatory mixed findings but nothing that appeared to be malignant which is great news. Overall this is more of a chronic venous stasis type issue which again is more what we have been treating. Nonetheless I just wanted to make sure before going forward that there was not anything more untoward going on at this point. 08/18/2021 upon evaluation today patient appears to be doing well with regard to her ankle ulcer. Fortunately there does not appear to be any signs of active infection at this time which is great overall wound is dramatically improved compared to last week. Since last week I have actually placed her on doxycycline and subsequently this is a good option as far as the findings are concerned at this point. 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 the doxycycline is doing that. There does not appear to be any evidence of active infection systemically which is great news. 08/25/2021 upon evaluation today patient appears to be doing well with regard to her wound. I feel like we are finally get back on track as far as healing is concerned I am much happier with the overall appearance today. I do think that she is tolerating the dressing changes without complication which is great news. We have been using Hydrofera Blue which I think is a good option. The good news is she is also doing great in Casselman, CALLAHAN PEDDIE. ([id number]) regard to her compression sock on the left which is a zipper compression that seems to be doing a great job keeping her edema under good control. 09/01/2021 upon  evaluation today patient appears to be doing well with regard to her wound. Infection seems to be under much better control which is great news and very pleased in that regard. Fortunately there does not appear to be any signs of infection currently. 09/08/2021 upon evaluation today patient actually appears to be making good progress in regard to her wound. She has been tolerating the dressing changes without complication. Fortunately there does not appear to be any evidence of active infection at this time which is great news as well. No fevers, chills, nausea, vomiting, or diarrhea. 09/22/2021 upon evaluation today patient appears to be doing well  with regard to her wound although is very slow to heal. We have not looked into Apligraf yet I think that is something that we should see about doing. 09/29/2021 upon evaluation today patient's wound is actually showing signs of doing about the same. I am not seeing any evidence of worsening but also no significant evidence of improvement. We did gain approval for the organogenesis products all except for Apligraf as covered by her insurance. With that being said I do think that we can go ahead and proceed with the NuShield if the patient and her family in agreement of the plan I discussed that with him today she does have a 20% coinsurance which we also discussed. 10/06/2021 upon evaluation today patient appears to unfortunately be doing a little bit worse she appears to be infected based on what I am seeing. Fortunately there does not appear to be any signs of active infection at this time which is great news. Unfortunately it does appear to be some evidence of around the wound edge indicated by way of erythema and warmth as well as redness 10/13/2021 upon evaluation today patient actually appears to be doing excellent in regard to her ankle ulcer compared to what it was. Fortunately though she does have evidence of infection, MRSA, on the culture which I  did review this overall should be managed by the antibiotic that I given her which was the doxycycline and again today this seems to be doing much better. I think were fine to go ahead and apply the NuShield today. 10/20/2021 upon evaluation today patient appears to be doing excellent in fact the NuShield seems to have done all some for her thus far. I am actually very pleased with where we stand and overall I think that she is making great progress. There is no evidence of active infection at this time. 11/23; patient presents for follow-up. She has no issues or complaints today. She denies signs of infection. She reports stability in her wound healing. 11/03/2021 upon evaluation today patient appears to be doing well with regard to her wounds. She has been tolerating the dressing changes without complication. Fortunately there is no signs of active infection at this time. 11/10/2021 upon evaluation today patient appears to be doing well with regard to her right ankle which is actually showing signs of improvement with the NuShield I am very pleased. Subsequently in regards to the left ankle this appears to be doing okay with no evidence of issue here either. With that being said she has a significant contusion on the left leg from having fallen 2 days ago. She tells me that she was using her walker going to the closet and then when she got to the closet turned around to get something out at which point she fell according to the story. Nonetheless she does have a hematoma just below her knee on the anterior portion of her shin. My hope is that this will not open until wound although we do need I think Some compression over it also think that we need to have her use an ice as well to help with the swelling and prevent this from getting worse. The last thing she needs is a wound opened up here. Objective Constitutional Well-nourished and well-hydrated in no acute distress. Vitals Time Taken: 10:11  AM, Height: 62 in, Weight: 150 lbs, BMI: 27.4, Temperature: 98.5 F, Pulse: 78 bpm, Respiratory Rate: 18 breaths/min, Blood Pressure: 167/87 mmHg. Respiratory normal breathing without difficulty. Psychiatric this patient is  able to make decisions and demonstrates good insight into disease process. Alert and Oriented x 3. pleasant and cooperative. General Notes: Upon inspection patient's wound again on the right leg seems to be doing well I did actually reapply the NuShield today and the patient tolerated this without complication. With that being said I do not see any signs of active infection which is great news. This was secured with Adaptic and Steri-Strips. With regard to her left leg the contusion area does have me concerned we will get a use Tubigrip over top of the leg in general to help with edema control and hopefully get this to reabsorb also want her to be using ice. In regard to the left lateral ankle there is nothing showing any signs of opening at this point. Integumentary (Hair, Skin) Wound #7 status is Open. Original cause of wound was Gradually Appeared. The date acquired was: 05/12/2021. The wound has been in treatment 25 weeks. The wound is located on the Right,Lateral Malleolus. The wound measures 0.7cm length x 0.8cm width x 0.2cm depth; 0.44cm^2 area Mcnellis, [name] ([id number]) and 0.088cm^3 volume. There is a medium amount of serosanguineous drainage noted. Assessment Active Problems ICD-10 Pressure ulcer of right ankle, stage 3 Lymphedema, not elsewhere classified Venous insufficiency (chronic) (peripheral) Contusion of left lower leg, initial encounter Presence of cardiac pacemaker Long term (current) use of anticoagulants Procedures Wound #7 Pre-procedure diagnosis of Wound #7 is a Pressure Ulcer located on the Right,Lateral Malleolus. A skin graft procedure using a bioengineered skin substitute/cellular or tissue based product was performed by Tommie Sams.,  PA-C with the following instrument(s): Forceps and Scissors. Nushield was applied and secured with Steri-Strips. 1.6 sq cm of product was utilized and 0 sq cm was wasted. Post Application, adaptic was applied. A Time Out was conducted at 10:34, prior to the start of the procedure. The procedure was tolerated well with a pain level of 0 throughout and a pain level of 0 following the procedure. Post procedure Diagnosis Wound #7: Same as Pre-Procedure . Plan Follow-up Appointments: Return Appointment in 1 week. Bathing/ Shower/ Hygiene: May shower with wound dressing protected with water repellent cover or cast protector. Cellular or Tissue Based Products: Cellular or Tissue Based Product Type: - NuShield #5 Cellular or Tissue Based Product applied to wound bed; including contact layer, fixation with steri-strips, dry gauze and cover dressing. (DO NOT REMOVE). Edema Control - Lymphedema / Segmental Compressive Device / Other: Optional: One layer of unna paste to top of compression wrap (to act as an anchor). - and at toes Tubigrip single layer applied. - size D to left lower leg to above the knee Elevate, Exercise Daily and Avoid Standing for Long Periods of Time. Elevate legs to the level of the heart and pump ankles as often as possible Elevate leg(s) parallel to the floor when sitting. WOUND #7: - Malleolus Wound Laterality: Right, Lateral Cleanser: Soap and Water 1 x Per Week/30 Days Discharge Instructions: Gently cleanse wound with antibacterial soap, rinse and pat dry prior to dressing wounds Secondary Dressing: Gauze 1 x Per Week/30 Days Discharge Instructions: As directed: dry, moistened with saline or moistened with Dakins Solution Compression Wrap: Profore Lite LF 3 Multilayer Compression Bandaging System 1 x Per Week/30 Days Discharge Instructions: Apply 3 multi-layer wrap as prescribed. 1. I would recommend currently that we go ahead and continue with the wound care measures as  before specifically with regard to the NuShield on the right ankle. 2. I am also  going to go ahead and continue with the recommendation for Tubigrip on the left leg she should also be icing this 3 times a day. 3. I am also can recommend that she should continue to elevate her legs as well to help with edema control. We will see patient back for reevaluation in 1 week here in the clinic. If anything worsens or changes patient will contact our office for additional recommendations. Electronic Signature(s) KYNDLE, SCHLENDER ([id number]) Signed: 11/10/2021 6:08:07 PM By: Worthy Keeler PA-C Entered By: Worthy Keeler on 11/10/2021 18:08:06 Tramell, [name] ([id number]) -------------------------------------------------------------------------------- SuperBill Details Patient Name: [name] Date of Service: 11/10/2021 Medical Record Number: [id number] Patient Account Number: [id number] Date of Birth/Sex: 05/19/1923 (87 y.o. F) Treating RN: Carlene Coria Primary Care Provider: Adrian Prows Other Clinician: Referring Provider: Adrian Prows Treating Provider/Extender: Skipper Cliche in Treatment: 25 Diagnosis Coding ICD-10 Codes Code Description [phone] Pressure ulcer of right ankle, stage 3 I89.0 Lymphedema, not elsewhere classified I87.2 Venous insufficiency (chronic) (peripheral) S80.12XA Contusion of left lower leg, initial encounter Z95.0 Presence of cardiac pacemaker Z79.01 Long term (current) use of anticoagulants Facility Procedures CPT4 Code: [id number] Description: H3716 NUSHIELD 1.6 SQ CM (CHG PER SQ CM) Modifier: Quantity: 2 CPT4 Code: 96789381 Description: 01751 - SKIN SUB GRAFT TRNK/ARM/LEG Modifier: Quantity: 1 CPT4 Code: Description: ICD-10 Diagnosis Description 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, stage 3 Modifier: Quantity: Physician Procedures CPT4 Code: 0258527 Description: 78242 - WC PHYS LEVEL 4 - EST PT Modifier: 25 Quantity:  1 CPT4 Code: Description: ICD-10 Diagnosis Description 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, stage 3 I89.0 Lymphedema, not elsewhere classified I87.2 Venous insufficiency (chronic) (peripheral) S80.12XA Contusion of left lower leg, initial encounter Modifier: Quantity: CPT4 Code: 3536144 Description: 31540 - WC PHYS SKIN SUB GRAFT TRNK/ARM/LEG Modifier: Quantity: 1 CPT4 Code: Description: ICD-10 Diagnosis Description 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, stage 3 Modifier: Quantity: Electronic Signature(s) Signed: 11/10/2021 6:08:54 PM By: Worthy Keeler PA-C Previous Signature: 11/10/2021 1:43:28 PM Version By: Carlene Coria RN Entered By: Worthy Keeler on 11/10/2021 18:08:53

## 2021-11-17 ENCOUNTER — Encounter: Payer: Medicare Other | Admitting: Physician Assistant

## 2021-11-17 ENCOUNTER — Ambulatory Visit (INDEPENDENT_AMBULATORY_CARE_PROVIDER_SITE_OTHER): Payer: Medicare Other | Admitting: Podiatry

## 2021-11-17 ENCOUNTER — Encounter: Payer: Self-pay | Admitting: Podiatry

## 2021-11-17 ENCOUNTER — Other Ambulatory Visit: Payer: Self-pay

## 2021-11-17 DIAGNOSIS — I739 Peripheral vascular disease, unspecified: Secondary | ICD-10-CM | POA: Diagnosis not present

## 2021-11-17 DIAGNOSIS — L89513 Pressure ulcer of right ankle, stage 3: Secondary | ICD-10-CM | POA: Diagnosis not present

## 2021-11-17 DIAGNOSIS — M79676 Pain in unspecified toe(s): Secondary | ICD-10-CM

## 2021-11-17 DIAGNOSIS — B351 Tinea unguium: Secondary | ICD-10-CM

## 2021-11-17 NOTE — Progress Notes (Addendum)
AIXA, [name] ([id number]) Visit Report for 11/17/2021 Chief Complaint Document Details Patient Name: [name], [name]. Date of Service: 11/17/2021 10:15 AM Medical Record Number: [id number] Patient Account Number: [id number] Date of Birth/Sex: 03/26/1923 (87 y.o. F) Treating RN: Carlene Coria Primary Care Provider: Adrian Prows Other Clinician: Referring Provider: Adrian Prows Treating Provider/Extender: Skipper Cliche in Treatment: 26 Information Obtained from: Patient Chief Complaint Right foot ulcer Electronic Signature(s) Signed: 11/17/2021 10:54:57 AM By: Worthy Keeler PA-C Entered By: Worthy Keeler on 11/17/2021 10:54:56 Cleaver, [name] ([id number]) -------------------------------------------------------------------------------- Cellular or Tissue Based Product Details Patient Name: [name], [name] Date of Service: 11/17/2021 10:15 AM Medical Record Number: [id number] Patient Account Number: [id number] Date of Birth/Sex: 05-04-1923 (87 y.o. F) Treating RN: Donnamarie Poag Primary Care Provider: Adrian Prows Other Clinician: Referring Provider: Adrian Prows Treating Provider/Extender: Skipper Cliche in Treatment: 26 Cellular or Tissue Based Product Type Wound #7 Right,Lateral Malleolus Applied to: Performed By: Physician Tommie Sams., PA-C Cellular or Tissue Based Product [place] Type: Level of Consciousness (Pre- Awake and Alert procedure): Pre-procedure Verification/Time Out Yes - 10:57 Taken: Location: trunk / arms / legs Wound Size (sq cm): 0.9 Product Size (sq cm): 1.6 Waste Size (sq cm): 0 Amount of Product Applied (sq cm): 1.6 Instrument Used: Forceps, Scissors Lot #: [id number] Order #: 6 Expiration Date: 08/10/2026 Fenestrated: No Reconstituted: Yes Solution Type: normal saline Solution Amount: 10 ml Lot #: [id number] Solution Expiration Date: 05/05/2023 Secured: Yes Secured With: Steri-Strips Dressing Applied:  Yes Primary Dressing: adaptic Procedural Pain: 0 Post Procedural Pain: 0 Response to Treatment: Procedure was tolerated well Level of Consciousness (Post- Awake and Alert procedure): Post Procedure Diagnosis Same as Pre-procedure Electronic Signature(s) Signed: 11/17/2021 4:58:48 PM By: Donnamarie Poag Entered By: Donnamarie Poag on 11/17/2021 16:58:47 Arneson, [name] ([id number]) -------------------------------------------------------------------------------- HPI Details Patient Name: [name] Date of Service: 11/17/2021 10:15 AM Medical Record Number: [id number] Patient Account Number: [id number] Date of Birth/Sex: Sep 11, 1923 (87 y.o. 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 Dr. Amalia Hailey the podiatrist saw her on 07/20/2017 and sent her to the wound center for possible hyperbaric oxygen therapy. past medical history of peripheral vascular disease, varicose veins, status post appendectomy, basal cell carcinoma excision from the left leg, cholecystectomy, pacemaker placement, right lower extremity angiography done by Dr. dew in March 2017 with placement of a stent. there is also note of a successful ablation of the right small saphenous vein done which was reviewed by ultrasound on 10/24/2016. the patient had a right small saphenous vein ablation done on 10/20/2016. The patient has never been a smoker. She has been seen by Dr. Corene Cornea dew the vascular surgeon who most recently saw her on 06/15/2017 for evaluation of ongoing problems with right leg swelling. She had a lower extremity arterial duplex  examination done(02/13/17) which showed patent distal right superficial femoral artery stent and above-the-knee popliteal stent without evidence of restenosis. The ABI was more than 1.3 on the right and more than 1.3 on the left. This was consistent with noncompressible arteries due to medial calcification. The right great toe pressure and PPG waveforms are within normal limits and the left great toe pressure and PPG waveforms are decreased.  he recommended she continue to wear her compression stockings and continue with elevation. She is scheduled to have a noninvasive arterial study in the near future 08/16/2017 -- had a lower extremity arterial duplex examination done which showed patent distal right superficial femoral artery stent and above-the-knee popliteal stent without evidence of restenosis. The ABI was more than 1.3 on the right and more than 1.3 on the left. This was consistent with noncompressible arteries due to medial calcification. The right great toe pressure and PPG waveforms are within normal limits and the left great toe pressure and PPG waveforms are decreased. the x-ray of the right ankle has not yet been done 08/24/2017 -- had a right ankle x-ray -- IMPRESSION:1. No fracture, bone lesion or evidence of osteomyelitis. 2. Lateral soft tissue swelling with a soft tissue ulcer. she has not yet seen the vascular surgeon for review 08/31/17 on evaluation today patient's wound appears to be showing signs of improvement. She still with her appointment with vascular in order to review her results of her vascular study and then determine if any intervention would be recommended at that time. No fevers, chills, nausea, or vomiting noted at this time. She has been tolerating the dressing changes without complication. 09/28/17 on evaluation today patient's wound appears to show signs of good improvement in regard to the granulation tissue which is surfacing. There is still a layer of slough  covering the wound and the posterior portion is still significantly deeper than the anterior nonetheless there has been some good sign of things moving towards the better. She is going to go back to Dr. dew for reevaluation to ensure her blood flow is still appropriate. That will be before her next evaluation with [place] next week. No fevers, chills, nausea, or vomiting noted at this time. Patient does have some discomfort rated to be a 3-4/10 depending on activity specifically cleansing the wound makes it worse. 10/05/2017 -- the patient was seen by Dr. Lucky Cowboy last week and noninvasive studies showed a normal right ABI with brisk triphasic waveforms consistent with no arterial insufficiency including normal digital pressures. The duplex showed a patent distal right SFA stent and the proximal SFA was also normal. He was pleased with her test and thought she should have enough of perfusion for normal wound healing. He would see her back in 6 months time. 12/21/17 on evaluation today patient appears to be doing fairly well in regard to her right lateral ankle wound. Unfortunately the main issue that she is expansion at this point is that she is having some issues with what appears to be some cellulitis in the right anterior shin. She has also been noting a little bit of uncomfortable feeling especially last night and her ankle area. I'm afraid that she made the developing a little bit of an infection. With that being said I think it is in the early stages. 12/28/17 on evaluation today patient's ankle appears to be doing excellent. She's making good progress at this point the cellulitis seems to have improved after last week's evaluation. Overall she is having no significant discomfort which is excellent news. She does have an appointment with Dr. dew on March 29, 2018 for reevaluation in regard to the stent he placed. She seems to have excellent blood flow in the right lower extremity. 01/19/12 on evaluation  today patient's wound appears to be doing very well. In fact she does not appear to require debridement at this point, there's no evidence of infection, and overall from  the standpoint of the wound she seems to be doing very well. With that being said I believe that it may be time to switch to different dressing away from the [facility] Dressing she tells me she does have a lot going on her friend actually passed away yesterday and she's also having a lot of issues with her husband this obviously is weighing heavy on her as far as your thoughts and concerns today. 01/25/18 on evaluation today patient appears to be doing fairly well in regard to her right lateral malleolus. She has been tolerating the dressing changes without complication. Overall I feel like this is definitely showing signs of improvement as far as how the overall appearance of the wound is there's also evidence of epithelium start to migrate over the granulation tissue. In general I think that she is progressing nicely as far as the wound is concerned. The only concern she really has is whether or not we can switch to every other week visits in order to avoid having as many appointments as her daughters have a difficult time getting her to her appointments as well as the patient's husband to his he is not doing very well at this point. 02/22/18 on evaluation today patient's right lateral malleolus ulcer appears to be doing great. She has been tolerating the dressing changes without complication. Overall you making excellent progress at this time. Patient is having no significant discomfort. [name], [name] ([id number]) 03/15/18 on evaluation today patient appears to be doing much more poorly in regard to her right lateral ankle ulcer at this point. Unfortunately since have last seen her her husband has passed just a few days ago is obviously weighed heavily on her her daughter also had surgery well she is with her today as  usual. There does not appear to be any evidence of infection she does seem to have significant contusion/deep tissue injury to the right lateral malleolus which was not noted previous when I saw her last. It's hard to tell of exactly when this injury occurred although during the time she was spending the night in the hospital this may have been most likely. 03/22/18 on evaluation today patient appears to actually be doing very well in regard to her ulcer. She did unfortunately have a setback which was noted last week however the good news is we seem to be getting back on track and in fact the wound in the core did still have some necrotic tissue which will be addressed at this point today but in general I'm seeing signs that things are on the up and up. She is glad to hear this obviously she's been somewhat concerned that due to the how her wound digressed more recently. 03/29/18 on evaluation today patient appears to be doing fairly well in regard to her right lower extremity lateral malleolus ulcer. She unfortunately does have a new area of pressure injury over the inferior portion where the wound has opened up a little bit larger secondary to the pressure she seems to be getting. She does tell me sometimes when she sleeps at night that it actually hurts and does seem to be pushing on the area little bit more unfortunately. There does not appear to be any evidence of infection which is good news. She has been tolerating the dressing changes without complication. She also did have some bruising in the left second and third toes due to the fact that she may have bump this or injured it although she has  neuropathy so she does not feel she did move recently that may have been where this came from. Nonetheless there does not appear to be any evidence of infection at this time. 04/12/18 on evaluation today patient's wound on the right lateral ankle actually appears to be doing a little bit better with a lot  of necrotic docking tissue centrally loosening up in clearing away. However she does have the beginnings of a deep tissue injury on the left lateral malleolus likely due to the fact we've been trying offload the right as much as we have. I think she may benefit from an assistive soft device to help with offloading and it looks like they're looking at one of the doughnut conditions that wraps around the lower leg to offload which I think will definitely do a good job. With that being said I think we definitely need to address this issue on the left before it becomes a wound. Patient is not having significant pain. 04/19/18 on evaluation today patient appears to be doing excellent in regard to the progress she's made with her right lateral ankle ulcer. The left ankle region which did show evidence of a deep tissue injury seems to be resolving there's little fluid noted underneath and a blister there's nothing open at this point in time overall I feel like this is progressing nicely which is good news. She does not seem to be having significant discomfort at this point which is also good news. 04/25/18-She is here in follow up evaluation for bilateral lateral malleolar ulcers. The right lateral malleolus ulcer with pale subcutaneous tissue exposure, central area of ulcer with tendon/periosteum exposed. The left lateral malleolus ulcer now with central area of nonviable tissue, otherwise deep tissue injury. She is wearing compression wraps to the left lower extremity, she will place the right lower extremity compression wraps on when she gets home. She will be out of town over the weekend and return next week and follow-up appointment. She completed her doxycycline this morning 05/03/18 on evaluation today patient appears to be doing very well in regard to her right lateral ankle ulcer in general. At least she's showing some signs of improvement in this regard. Unfortunately she has some additional injury  to the left lateral malleolus region which appears to be new likely even over the past several days. Again this determination is based on the overall appearance. With that being said the patient is obviously frustrated about this currently. 05/10/18-She is here in follow-up evaluation for bilateral lateral malleolar ulcers. She states she has purchased offloading shoes/boots and they will arrive tomorrow. She was asked to bring them in the office at next week's appointment so her provider is aware of product being utilized. She continues to sleep on right or left side, she has been encouraged to sleep on her back. The right lateral malleolus ulcer is precariously close to peri-osteum; will order xray. The left lateral malleolus ulcer is improved. Will switch back to santyl; she will follow up next week. 05/17/18 on evaluation today patient actually appears to be doing very well in regard to her malleolus her ulcers compared to last time I saw them. She does not seem to have as much in the way of contusion at this point which is great news. With that being said she does continue to have discomfort and I do believe that she is still continuing to benefit from the offloading/pressure reducing boots that were recommended. I think this is the key to trying to  get this to heal up completely. 05/24/18 on evaluation today patient actually appears to be doing worse at this point in time unfortunately compared to her last week's evaluation. She is having really no increased pain which is good news unfortunately she does have more maceration in your theme and noted surrounding the right lateral ankle the left lateral ankle is not really is erythematous I do not see signs of the overt cellulitis on that side. Unfortunately the wounds do not seem to have shown any signs of improvement since the last evaluation. She also has significant swelling especially on the right compared to previous some of this may be due to  infection however also think that she may be served better while she has these wounds by compression wrapping versus continuing to use the Juxta-Lite for the time being. Especially with the amount of drainage that she is experiencing at this point. No fevers, chills, nausea, or vomiting noted at this time. 05/31/18 on evaluation today patient appears to actually be doing better in regard to her right lateral lower extremity ulcer specifically on the malleolus region. She has been tolerating the antibiotic without complication. With that being said she still continues to have issues but a little bit of redness although nothing like she what she was experiencing previous. She still continues to pressure to her ankle area she did get the problem on offloading boots unfortunately she will not wear them she states there too uncomfortable and she can't get in and out of the bed. Nonetheless at this point her wounds seem to be continually getting worse which is not what we want I'm getting somewhat concerned about her progress and how things are going to proceed if we do not intervene in some way shape or form. I therefore had a very lengthy conversation today about offloading yet again and even made a specific suggestion for switching her to a memory foam mattress and even gave the information for a specific one that they could look at getting if it was something that they were interested in considering. She does not want to be considered for a hospital bed air mattress although honestly insurance would not cover it that she does not have any wounds on her trunk. 06/14/18 on evaluation today both wounds over the bilateral lateral malleolus her ulcers appear to be doing better there's no evidence of pressure injury at this point. She did get the foam mattress for her bed and this does seem to have been extremely beneficial for her in my pinion. Her daughter states that she is having difficulty getting out of  bed because of how soft it is. The patient also relates this to be. Nonetheless I do feel like she's actually doing better. Unfortunately right after and around the time she was getting the mattress she also sustained a fall when she got up to go pick up the phone and ended up injuring her right elbow she has 18 sutures in place. We are not caring for this currently although home health is going to be taking the sutures out shortly. Nonetheless this may be something that we need to evaluate going forward. It depends on how well it has or has not healed in the end. She also recently saw an orthopedic specialist for an injection in the right shoulder just before her fall unfortunately the fall seems to have worsened her pain. 06/21/18 on evaluation today patient appears to be doing about the same in regard to her lateral malleolus  ulcers. Both appear to be just a little bit deeper but again we are clinging away the necrotic and dead tissue which I think is why this is progressing towards a deeper realm as opposed YACINE, DROZ. ([id number]) to improving from my measurement standpoint in that regard. Nonetheless she has been tolerating the dressing changes she absolutely hates the memory foam mattress topper that was obtained for her nonetheless I do believe this is still doing excellent as far as taking care of excess pressure in regard to the lateral malleolus regions. She in fact has no pressure injury that I see whereas in weeks past it was week by week I was constantly seeing new pressure injuries. Overall I think it has been very beneficial for her. 07/03/18; patient arrives in my clinic today. She has deep punched out areas over her bilateral lateral malleoli. The area on the right has some more depth. We spent a lot of time today talking about pressure relief for these areas. This started when her daughter asked for a prescription for a memory foam mattress. I have never written a prescription  for a mattress and I don't think insurances would pay for that on an ordinary bed. In any case he came up that she has foam boots that she refuses to wear. I would suggest going to these before any other offloading issues when she is in bed. They say she is meticulous about offloading this the rest of the day 07/10/18- She is seen in follow-up evaluation for bilateral, lateral malleolus ulcers. There is no improvement in the ulcers. She has purchased and is sleeping on a memory foam mattress/overlay, she has been using the offloading boots nightly over the past week. She has a follow up appointment with vascular medicine at the end of October, in my opinion this follow up should be expedited given her deterioration and suboptimal TBI results. We will order plain film xray of the left ankle as deeper structures are palpable; would consider having MRI, regardless of xray report(s). The ulcers will be treated with iodoflex/iodosorb, she is unable to safely change the dressings daily with santyl. 07/19/18 on evaluation today patient appears to be doing in general visually well in regard to her bilateral lateral malleolus ulcers. She has been tolerating the dressing changes without complication which is good news. With that being said we did have an x-ray performed on 07/12/18 which revealed a slight loosen see in the lateral portion of the distal left fibula which may represent artifact but underline lytic destruction or osteomyelitis could not be excluded. MRI was recommended. With that being said we can see about getting the patient scheduled for an MRI to further evaluate this area. In fact we have that scheduled currently for August 20 19,019. 07/26/18 on evaluation today patient's wound on the right lateral ankle actually appears to be doing fairly well at this point in my pinion. She has made some good progress currently. With that being said unfortunately in regard to the left lateral ankle ulcer this  seems to be a little bit more problematic at this time. In fact as I further evaluated the situation she actually had bone exposed which is the first time that's been the case in the bone appear to be necrotic. Currently I did review patient's note from Dr. Bunnie Domino office with [facility]. He stated that ABI was 1.26 on the right and 0.95 on the left with good waveforms. Her perfusion is stable not reduced from  previous studies and her digital waveforms were pretty good particularly on the right. His conclusion upon review of the note was that there was not much she could do to improve her perfusion and he felt she was adequate for wound healing. His suggestion was that she continued to see [place] and consider a synthetic skin graft if there was no underlying infection. He plans to see her back in six months or as needed. 08/01/18 on evaluation today patient appears to be doing better in regard to her right lateral ankle ulcer. Her left lateral ankle ulcer is about the same she still has bone involvement in evidence of necrosis. There does not appear to be evidence of infection at this time On the right lateral lower extremity. I have started her on the Augmentin she picked this up and started this yesterday. This is to get her through until she sees infectious disease which is scheduled for 08/12/18. 08/06/18 on evaluation today patient appears to be doing rather well considering my discussion with patient's daughter at the end of last week. The area which was marked where she had erythema seems to be improved and this is good news. With that being said overall the patient seems to be making good improvement when it comes to the overall appearance of the right lateral ankle ulcer although this has been slow she at least is coming around in this regard. Unfortunately in regard to the left lateral ankle ulcer this is osteomyelitis based on the pathology report as well is bone culture.  Nonetheless we are still waiting CT scan. Unfortunately the MRI we originally ordered cannot be performed as the patient is a pacemaker which I had overlooked. Nonetheless we are working on the CT scan approval and scheduling as of now. She did go to the hospital over the weekend and was placed on IV Cefzo for a couple of days. Fortunately this seems to have improved the erythema quite significantly which is good news. There does not appear to be any evidence of worsening infection at this time. She did have some bleeding after the last debridement therefore I did not perform any sharp debridement in regard to left lateral ankle at this point. Patient has been approved for a snap vac for the right lateral ankle. 08/14/18; the patient with wounds over her bilateral lateral malleoli. The area on the right actually looks quite good. Been using a snap back on this area. Healthy granulation and appears to be filling in. Unfortunately the area on the left is really problematic. 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 Also noted to have cellulitis. She saw Dr. Novella Olive of infectious disease today and was put on linezolid. We are able to verify this with her pharmacy. She is completed the Augmentin that she was already on. We've been using Iodoflex to this area 08/23/18 on evaluation today patient's wounds both actually appear to be doing better compared to my prior evaluations. Fortunately she showing signs of good improvement in regard to the overall wound status especially where were using the snap vac on the right. In regard to left lateral malleolus the wound bed actually appears to be much cleaner than previously noted. I do not feel any phone directly probed during evaluation today and though there is tendon noted this does not appear to be necrotic it's actually fairly good as far as the overall appearance of the  tendon is concerned.  In general the wound bed actually appears to be doing significantly better than it was previous. Patient is currently in the care of [name] and I did review that note today. He actually has her on two weeks of linezolid and then following the patient will be on 1-2 months of Keflex. That is the plan currently. She has been on antibiotics therapy as prescribed by myself initially starting on July 30, 2018 and has been on that continuously up to this point. 08/30/18 on evaluation today patient actually appears to be doing much better in regard to her right lateral malleolus ulcer. She has been tolerating the dressing changes specifically the snap vac without complication although she did have some issues with the seal currently. Apparently there was some trouble with getting it to maintain over the past week past Sunday. Nonetheless overall the wound appears better in regard to the right lateral malleolus region. In regard to left lateral malleolus this actually show some signs of additional granulation although there still tendon noted in the base of the wound this appears to be healthy not necrotic in any way whatsoever. We are considering potentially using a snap vac for the left lateral malleolus as well the product wrap from [employer], [place], was present in the clinic today we're going to see this patient I did have her come in with me after obtaining consent from the patient and her daughter in order to look at the wound and see if there's any recommendation one way or another as to whether or not they felt the snapback could be beneficial for the left lateral malleolus region. But the conclusion was that it might be but that this is definitely a little bit deeper wound than what traditionally would be utilized for a snap vac. 09/06/18 on evaluation today patient actually appears to be doing excellent in my pinion in regard to both ankle ulcers. She has been tolerating  the dressing changes without complication which is great news. Specifically we have been using the snap vac. In regard to the right ankle I'm not even sure that this is going to be necessary for today and following as the wound has filled in quite nicely. In regard to the left ankle I do believe [name], [name] ([id number]) that we're seeing excellent epithelialization from the edge as well as granulation in the central portion the tendon is still exposed but there's no evidence of necrotic bone and in general I feel like the patient has made excellent progress even compared to last week with just one week of the snap vac. 09/11/18; this is a patient who has wounds on her bilateral lateral malleoli. Initially both of these were deep stage IV wounds in the setting of chronic arterial insufficiency. She has been revascularized. As I understand think she been using snap vacs to both of these wounds however the area on the right became more superficial and currently she is only using it on the left. Using silver collagen on the right and silver collagen under the back on the left I believe 09/19/18 on evaluation today patient actually appears to be doing very well in regard to her lateral malleolus or ulcers bilaterally. She has been tolerating the dressing changes without complication. Fortunately there does not appear to be any evidence of infection at this time. Overall I feel like she is improving in an excellent manner and I'm very pleased with the fact that everything seems to be turning towards the better for her. This  has obviously been a long road. 09/27/18 on evaluation today patient actually appears to be doing very well in regard to her bilateral lateral malleolus ulcers. She has been tolerating the dressing changes without complication. Fortunately there does not appear to be any evidence of infection at this time which is also great news. No fevers, chills, nausea, or vomiting noted at  this time. Overall I feel like she is doing excellent with the snap vac on the left malleolus. She had 40 mL of fluid collection over the past week. 10/04/18 on evaluation today patient actually appears to be doing well in regard to her bilateral lateral malleolus ulcers. She continues to tolerate the dressing changes without complication. One issue that I see is the snap vac on the left lateral malleolus which appears to have sealed off some fluid underlying this area and has not really allowed it to heal to the degree that I would like to see. For that reason I did suggest at this point we may want to pack a small piece of packing strip into this region to allow it to more effectively wick out fluid. 10/11/18 in general the patient today does not feel that she has been doing very well. She's been a little bit lethargic and subsequently is having bodyaches as well according to what she tells me today. With that being said overall she has been concerned with the fact that something may be worsening although to be honest her wounds really have not been appearing poorly. She does have a new ulcer on her left heel unfortunately. This may be pressure related. Nonetheless it seems to me to have potentially started at least as a blister I do not see any evidence of deep tissue injury. In regard to the left ankle the snap vac still seems to be causing the ceiling off of the deeper part of the wound which is in turn trapping fluid. I'm not extremely pleased with the overall appearance as far as progress from last week to this week therefore I'm gonna discontinue the snap vac at this point. 10/18/18 patient unfortunately this point has not been feeling well for the past several days. She was seen by Grayland Ormond her primary care provider who is a Librarian, academic at [name]. Subsequently she states that she's been very weak and generally feeling malaise. No fevers, chills, nausea, or vomiting noted  at this time. With that being said bloodwork was performed at the PCP office on the 11th of this month which showed a white blood cell count of 10.7. This was repeated today and shows a white blood cell count of 12.4. This does show signs of worsening. Coupled with the fact that she is feeling worse and that her left ankle wound is not really showing signs of improvement I feel like this is an indication that the osteomyelitis is likely exacerbating not improving. Overall I think we may also want to check her C-reactive protein and sedimentation rate. Actually did call [facility] office this afternoon while the patient was in the office here with me. Subsequently based on the findings we discussed treatment possibilities and I think that it is appropriate for [place] to go ahead and initiate treatment with doxycycline which I'm going to do. Subsequently he did agree to see about adding a CRP and sedimentation rate to her orders. If that has not already been drawn to where they can run it they will contact the patient she can come back to have that  check. They are in agreement with plan as far as the patient and her daughter are concerned. Nonetheless also think we need to get in touch with Dr. Henreitta Leber office to see about getting the patient scheduled with him as soon as possible. 11/08/18 on evaluation today patient presents for follow-up concerning her bilateral foot and ankle ulcers. I did do an extensive review of her chart in epic today. Subsequently she was seen by [name] he did initiate Cefepime IV antibiotic therapy. Subsequently she had some issues with her PICC line this had to be removed because it was coiled and then replaced. Fortunately that was now settled. Unfortunately she has continued have issues with her left heel as well as the issues that she is experiencing with her bilateral lateral malleolus regions. I do believe however both areas seem to be doing a little bit better on  evaluation today which is good news. No fevers, chills, nausea, or vomiting noted at this time. She actually has an angiogram schedule with Dr. dew on this coming Monday, November 11, 2018. Subsequently the patient states that she is feeling much better especially than what she was roughly 2 weeks ago. She actually had to cancel an appointment because she was feeling so poorly. No fevers, chills, nausea, or vomiting noted at this time. 11/15/18 on evaluation today patient actually is status post having had her angiogram with Dr. dew Monday, four days ago. 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 He had to go and work on several areas of the vasculature fortunately he was able to obtain no more than a 30% residual stenosis throughout post procedure. I reviewed this note today. I think this will definitely help with healing at this time. Fortunately there does not appear to be any signs of infection and I do feel like ratio already has a better appearance to it. 11/22/18 upon evaluation today patient actually appears to be doing very well in regard to her wounds in general. The right lateral malleolus looks excellent the heel looks better in the left lateral malleolus also appears to be doing a little better. With that being said the right second toe actually appears to be open and training we been watching this is been dry and stable but now is open. 12/03/2018 Seen today for follow-up and management of multiple bilateral lower extremity wounds. New pressure injury of the great toe which is closed at this time. Wound of the right distal second toe appears larger today with deep undermining and a pocket of fluid present within the undermining region. Left and right malleolus is wounds are stable today with no signs and symptoms of infection.Denies any needs or concerns during exam today. 12/13/18 on evaluation today patient appears to be doing somewhat better in regard to her  left heel ulcer. She also seems to be completely healed in regard to the right lateral malleolus ulcer. The left malleolus ulcer is smaller what unfortunately the wounds which are new over the first and second toes of the right foot are what are most concerning at this point especially the second. Both areas did require sharp debridement today. 12/20/18 on evaluation today patient's wound actually appears to be doing better in regard to left lateral ankle and her right lateral ankle continues to remain healed. The hill ulcer on the left is improved. She does have improvement noted as well in regard to both toe ulcers. Overall I'm very pleased in this regard. No  fevers, chills, nausea, or vomiting noted at this time. 12/23/18 on evaluation today patient is seen after she had her toenails trimmed at the podiatrist office due to issues with her right great toe. There was what appeared to be dark eschar on the surface of the wound which had her in the podiatrist concerned. Nonetheless as I remember that during the last office visit I had utilize silver nitrate of this area I was much less concerned about the situation. Subsequently I was able to clean off much of this tissue without any complication today. This does not appear to show any signs of infection and actually look somewhat better Deeney, [name] ([id number]) compared to last time post debridement. Her second toe on the right foot actually had callous over and there did appear still be some fluid underneath this that would require debridement today. 12/27/18 on evaluation today patient actually appears to be showing signs of improvement at all locations. Even the left lateral ankle although this is not quite as great as the other sites. Fortunately there does not appear to be any signs of infection at this time and both of her toes on the right foot seem to be showing signs of improvement which is good news and very pleased in this  regard. 01/03/19 on evaluation today patient appears to be doing better for the most part in regard to her wounds in particular. There does not appear to be any evidence of infection at this time which is good news. Fortunately there is no sign of really worsening anywhere except for the right great toe which she does have what appears to be a bruise/deep tissue injury which is very superficial and already resolving. I'm not sure where this came from I questioned her extensively and she does not recall what may have happened with this. Other than that the patient seems to be doing well even the left lateral ankle ulcer looks good and is getting smaller. 01/10/19 on evaluation today patient appears to be doing well in regard to her left heel wound and both of her toe wounds. Overall I feel like there is definitely improvement here and I'm happy in that regard. With that being said unfortunately she is having issues with the left lateral malleolus ulcer which unfortunately still has a lot of depth to it. This is gonna be a very difficult wound for [place] to be able to truly get to heal. I may want to consider some type of skin substitute to see if this would be of benefit for her. I'll discuss this with her more the next visit most likely. This was something I thought about more at the end of the visit when I was Artie out of the room and the patient had been discharged. 01/17/19 on evaluation today patient appears to be doing very well in regard to her wounds in general. She's been making excellent progress at this time. Fortunately there's no sign of infection at this time either. No fevers, chills, nausea, or vomiting noted at this time. The biggest issue is still her left lateral malleolus where it appears to be doing well and is getting smaller but still shows a small corner where this is deeper and goes down into what appears to be the joint space. Nonetheless this is taking much longer to heal although it  still looks better in smaller than previous evaluations. 01/24/19 on evaluation today patient's wounds actually appear to be doing rather well in general overall. She did require  some sharp debridement in regard to the right great toe but everything else appears to be doing excellent no debridement was even necessary. No fevers, chills, nausea, or vomiting noted at this time. 01/31/19 on evaluation today patient actually appears to be doing much better in regard to her left foot wound on the heel as well as the ankle. The right great toe appears to be a little bit worse today this had callous over and trapped a lot of fluid underneath. Fortunately there's no signs of infection at any site which is great news. 02/07/19 on evaluation today patient actually appears to be doing decently well in regard to all of her ulcers at this point. No sharp debridement was required she is a little bit of hyper granulation in regard to the left lateral ankle as well as the left heel but the hill itself is almost completely healed which is excellent news. Overall been very pleased in this regard. 02/14/19 on evaluation today patient actually appears to be doing very well in regard to her ulcers on the right first toe, left lateral malleolus, and left heel. In fact the heel is almost completely healed at this point. The patient does not show any signs of infection which is good news. Overall very pleased with how things have progressed. 04/18/19 Telehealth Evaluation During the COVID-19 National Emergency: Verbal Consent: Obtained from patient Allergies: reviewed and the active list is current. Medication changes: patient has no current medication changes. COVID-19 Screening: 1. Have you traveled internationally or on a cruise ship in the last 14 dayso No 2. Have you had contact with someone with or under investigation for COVID-19o No 3. Have you had a fever, cough, sore throat, or experiencing shortness of breatho  No on evaluation today actually did have a visit with this patient through a telehealth encounter with her home health nurse. Subsequently it was noted that the patient actually appears to be doing okay in regard to her wounds both the right great toe as well as the left lateral malleolus have shown signs of improvement although this in your theme around the left lateral malleolus there eschar coverings for both locations. The question is whether or not they are actually close and whether or not home health needs to discharge the patient or not. Nonetheless my concern is this point obviously is that without actually seeing her and being able to evaluate this directly I cannot ensure that she is completely healed which is the question that I'm being asked. 04/22/19 on evaluation today patient presents for her first evaluation since last time I saw her which was actually February 14, 2019. I did do a telehealth visit last week in which point it was questionable whether or not she may be healed and had to bring her in today for confirmation. With that being said she does seem to be doing quite well at this point which is good news. There does not appear to be any drainage in the deed I believe her wounds may be healed. Readmission: 09/04/2019 on evaluation today patient appears to be doing unfortunately somewhat more poorly in regard to her left foot ulcer secondary to a wound that began on 08/21/2019 at least when she first noticed this. Fortunately she has not had any evidence of active infection at this time. Systemically. I also do not necessarily see any evidence of infection at the blister/wound site on the first metatarsal head plantar aspect. This almost appears to be something that may have just  rubbed inappropriately causing this to breakdown. They did not want a wait too long to come in to be seen as again she had significant issues in the past with wounds that took quite a while to heal in fact it  was close to 2 years. Nonetheless this does not appear to be quite that bad but again we do need to remove some of the necrotic tissue from the surface of the wound to tell exactly the extent. She does not appear to have any significant arterial disease at this point and again her last ABIs and TBI's are recorded above in the alert section her left ABI was 1.27 with a TBI of 0.72 to the right ABI 1.08 with a TBI of 0.39. Other than this the patient has been doing quite well since I last saw her and that was in May 2020. 09/11/2019 on evaluation today patient appeared to be doing very well with regard to her plantar foot ulcer on the left. In fact this appears to be almost completely healed which is awesome. That is after just 1 week of intervention. With that being said there is no signs of active infection at this time. [name], [name] ([id number]) 09/18/2019 on evaluation today patient actually appears to be doing excellent in fact she is completely healed based on what I am seeing at this point. Fortunately there is no signs of active infection at this time and overall patient is very pleased to hear that this area has healed so quickly. Readmission: 05/13/2021 upon evaluation today this patient presents for reevaluation here in the clinic. This is a wound that actually we previously took care of. She had 1 on the right ankle and the left the left turned out to be be harder due to to heal but nonetheless is doing great at this point as the right that has reopened and it was noted first just several weeks ago with a scab over it and came off in just the past few days. Fortunately there does not appear to be any obvious evidence of significant active infection at this time which is great news. No fevers, chills, nausea, vomiting, or diarrhea. 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 There does not appear to be any signs of this interfering in any  way with her wound. She does have swelling we previously had compression socks for her ordered but again it does not look like she wears these on a regular basis by any means. 05/26/2021 upon evaluation today patient appears to be doing well with regard to her wound which is actually showing signs of excellent improvement. There does not appear to be any signs of active infection which is great news and overall very pleased with where things stand today. No fevers, chills, nausea, vomiting, or diarrhea. 06/02/2021 upon evaluation today patient's wound actually showing signs of excellent improvement. Fortunately there does not appear to be any signs of active infection which is great news. I think the patient is making good progress with regard to her wounds in general. 06/09/2021 upon evaluation today patient appears to be doing excellent in regard to her wounds currently. Fortunately there does not appear to be any signs of active infection which is great news. No fevers, chills, nausea, vomiting, or diarrhea. Overall extremely pleased with where things stand today. I think the patient is making excellent progress. 06/16/2021 upon evaluation today patient appears to be doing well in regard to her  wound. This is going require little bit of debridement today and that was discussed with the patient. Otherwise she seems to be doing quite well and I am actually very pleased with where things stand at this point. No fevers, chills, nausea, vomiting, or diarrhea. 06/23/2021 upon evaluation today patient appears to be doing well with regard to her wounds. She has been tolerating the dressing changes without complication. Fortunately there does not appear to be any evidence of infection and she has not had air in her home which she actually lives at an assisted living that got fixed this morning. With that being said because of that her wrap has been extremely hot and bothersome for her over the past  week. 06/30/2021 upon evaluation today patient is actually making excellent progress in regard to her ankle ulcer. She has been tolerating the dressing changes without complication and overall extremely pleased with where things stand there does not appear to be any evidence of active infection which is great news. No fevers, chills, nausea, vomiting, or diarrhea. 07/07/21 upon evaluation today patients and culture on the right actually appears to be doing quite well. There does not appear to be any signs of infection and overall very pleased with where things stand today. No fevers, chills, nausea, or vomiting noted at this time. 07/14/2021 unfortunately the patient today has some evidence of deep tissue injury and pressure getting to the ankle region. Again I am not exactly sure what is going on here but this is very similar to issues that we have had in the past. I explained to the patient that she needs to be very mindful of exactly what is happening I think sleeping in bed is probably the main issue here although there could be other culprits I am not sure what else would potentially lead to this kind of a problem for her. 07/21/2021 upon evaluation today patient's wound actually showing signs of improvement compared to last week. Fortunately there does not appear to be any signs of active infection which is great news and overall very pleased with where things stand in that regard. With that being said I do believe that she is continuing to show signs of overall of getting better although I think this is still basically about what we were 2 weeks ago due to the worsening and now improvement. 07/28/2021 upon evaluation today patient appears to be doing well with regard to her wound. She does have some slough buildup on the surface of the wound which I would have to manage today. Fortunately there is no sign of active infection at this time. No fevers, chills, nausea, vomiting, or diarrhea. 08/04/2021  upon evaluation today patient appears to be doing about the same in regard to her wound. To be perfectly honest I am beginning to be a little bit concerned about the overall appearance of the wound bed. I do think possibly taking a sample right around the margin of the wound could be beneficial for her as far as identifying anything such as an inflammatory process or to be honest even a skin tag cancer type process that may be of concern here. Fortunately there does not appear to be any evidence of active infection at this time which is great news she is not having any pain also great news. 08/11/2021 upon evaluation today patient appears to be doing well with regard to her wound. The good news is I did review her biopsy results and it showed some inflammatory mixed findings but  nothing that appeared to be malignant which is great news. Overall this is more of a chronic venous stasis type issue which again is more what we have been treating. Nonetheless I just wanted to make sure before going forward that there was not anything more untoward going on at this point. 08/18/2021 upon evaluation today patient appears to be doing well with regard to her ankle ulcer. Fortunately there does not appear to be any signs of active infection at this time which is great overall wound is dramatically improved compared to last week. Since last week I have actually placed her on doxycycline and subsequently this is a good option as far as the findings are concerned at this point. 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 the doxycycline is doing that. There does not appear to be any evidence of active infection systemically which is great news. 08/25/2021 upon evaluation today patient appears to be doing well with regard to her wound. I feel like we are finally get back on track as far  as healing is concerned I am much happier with the overall appearance today. I do think that she is tolerating the dressing changes without complication which is great news. We have been using Hydrofera Blue which I think is a good option. The good news is she is also doing great in regard to her compression sock on the left which is a zipper compression that seems to be doing a great job keeping her edema under good control. 09/01/2021 upon evaluation today patient appears to be doing well with regard to her wound. Infection seems to be under much better control which is great news and very pleased in that regard. Fortunately there does not appear to be any signs of infection currently. [name], [name] ([id number]) 09/08/2021 upon evaluation today patient actually appears to be making good progress in regard to her wound. She has been tolerating the dressing changes without complication. Fortunately there does not appear to be any evidence of active infection at this time which is great news as well. No fevers, chills, nausea, vomiting, or diarrhea. 09/22/2021 upon evaluation today patient appears to be doing well with regard to her wound although is very slow to heal. We have not looked into Apligraf yet I think that is something that we should see about doing. 09/29/2021 upon evaluation today patient's wound is actually showing signs of doing about the same. I am not seeing any evidence of worsening but also no significant evidence of improvement. We did gain approval for the organogenesis products all except for Apligraf as covered by her insurance. With that being said I do think that we can go ahead and proceed with the NuShield if the patient and her family in agreement of the plan I discussed that with him today she does have a 20% coinsurance which we also discussed. 10/06/2021 upon evaluation today patient appears to unfortunately be doing a little bit worse she appears to be infected  based on what I am seeing. Fortunately there does not appear to be any signs of active infection at this time which is great news. Unfortunately it does appear to be some evidence of around the wound edge indicated by way of erythema and warmth as well as redness 10/13/2021 upon evaluation today patient actually appears to be doing excellent in regard to her ankle  ulcer compared to what it was. 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 I think were fine to go ahead and apply the NuShield today. 10/20/2021 upon evaluation today patient appears to be doing excellent in fact the NuShield seems to have done all some for her thus far. I am actually very pleased with where we stand and overall I think that she is making great progress. There is no evidence of active infection at this time. 11/23; patient presents for follow-up. She has no issues or complaints today. She denies signs of infection. She reports stability in her wound healing. 11/03/2021 upon evaluation today patient appears to be doing well with regard to her wounds. She has been tolerating the dressing changes without complication. Fortunately there is no signs of active infection at this time. 11/10/2021 upon evaluation today patient appears to be doing well with regard to her right ankle which is actually showing signs of improvement with the NuShield I am very pleased. Subsequently in regards to the left ankle this appears to be doing okay with no evidence of issue here either. With that being said she has a significant contusion on the left leg from having fallen 2 days ago. She tells me that she was using her walker going to the closet and then when she got to the closet turned around to get something out at which point she fell according to  the story. Nonetheless she does have a hematoma just below her knee on the anterior portion of her shin. My hope is that this will not open until wound although we do need I think Some compression over it also think that we need to have her use an ice as well to help with the swelling and prevent this from getting worse. The last thing she needs is a wound opened up here. 11/17/2021 upon evaluation patient's right ankle actually showing signs of improvement based on what I see currently there is a lot of new skin growth coming in which is great news. Nonetheless I do feel like that the patient is showing improvement as well in regard to her left leg with the use of the Tubigrip it swollen but not as bruised as what I would have expected after what I was seeing last week. Nonetheless I do think doubling up on the Tubigrip would probably be beneficial to try to keep some of the edema under better control here. Electronic Signature(s) Signed: 11/17/2021 6:29:05 PM By: Worthy Keeler PA-C Entered By: Worthy Keeler on 11/17/2021 18:29:05 [place], [name] ([id number]) -------------------------------------------------------------------------------- Physical Exam Details Patient Name: [name] Date of Service: 11/17/2021 10:15 AM Medical Record Number: [id number] Patient Account Number: [id number] Date of Birth/Sex: 03/24/1923 (87 y.o. F) Treating RN: Carlene Coria Primary Care Provider: Adrian Prows Other Clinician: Referring Provider: Adrian Prows Treating Provider/Extender: Skipper Cliche in Treatment: 51 Constitutional Well-nourished and well-hydrated in no acute distress. Respiratory normal breathing without difficulty. Psychiatric this patient is able to make decisions and demonstrates good insight into disease process. Alert and Oriented x 3. pleasant and cooperative. Notes Upon inspection patient's wound bed actually showed signs of good granulation  epithelization no sharp debridement was necessary and I did actually proceed with reapplication of the NuShield today. She tolerated that without complication this was secured  with a Mepitel and Steri-Strips. Electronic Signature(s) Signed: 11/17/2021 6:29:24 PM By: Worthy Keeler PA-C Entered By: Worthy Keeler on 11/17/2021 18:29:24 [name] ([id number]) -------------------------------------------------------------------------------- Physician Orders Details Patient Name: [name] Date of Service: 11/17/2021 10:15 AM Medical Record Number: [id number] Patient Account Number: [id number] Date of Birth/Sex: 05-Jul-1923 (87 y.o. F) Treating RN: Carlene Coria Primary Care Provider: Adrian Prows Other Clinician: Referring Provider: Adrian Prows Treating Provider/Extender: Skipper Cliche in Treatment: 74 Verbal / Phone Orders: No Diagnosis Coding ICD-10 Coding Code Description 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, stage 3 I89.0 Lymphedema, not elsewhere classified I87.2 Venous insufficiency (chronic) (peripheral) S80.12XA Contusion of left lower leg, initial encounter Z95.0 Presence of cardiac pacemaker Z79.01 Long term (current) use of anticoagulants Follow-up Appointments o Return Appointment in 1 week. Bathing/ Shower/ Hygiene o May shower with wound dressing protected with water repellent cover or cast protector. Cellular or Tissue Based Products o Cellular or Tissue Based Product Type: - NuShield #6 o Cellular or Tissue Based Product applied to wound bed; including contact layer, fixation with steri-strips, dry gauze and cover dressing. (DO NOT REMOVE). Edema Control - Lymphedema / Segmental Compressive Device / Other o Optional: One layer of unna paste to top of compression wrap (to act as an anchor). - and at toes o Tubigrip single layer applied. - size D to left lower leg to above the knee o Elevate, Exercise Daily and Avoid  Standing for Long Periods of Time. o Elevate legs to the level of the heart and pump ankles as often as possible o Elevate leg(s) parallel to the floor when sitting. Wound Treatment Wound #7 - Malleolus Wound Laterality: Right, Lateral Cleanser: Soap and Water 1 x Per Week/30 Days Discharge Instructions: Gently cleanse wound with antibacterial soap, rinse and pat dry prior to dressing wounds Secondary Dressing: Gauze 1 x Per Week/30 Days Discharge Instructions: As directed: dry, moistened with saline or moistened with Dakins Solution Compression Wrap: Profore Lite LF 3 Multilayer Compression Bandaging System 1 x Per Week/30 Days Discharge Instructions: Apply 3 multi-layer wrap as prescribed. Electronic Signature(s) Signed: 11/17/2021 7:26:01 PM By: Worthy Keeler PA-C Signed: 11/18/2021 4:49:51 PM By: Carlene Coria RN Entered By: Carlene Coria on 11/17/2021 11:02:01 [place], [name] ([id number]) -------------------------------------------------------------------------------- Problem List Details Patient Name: [name] Date of Service: 11/17/2021 10:15 AM Medical Record Number: [id number] Patient Account Number: [id number] Date of Birth/Sex: 10/22/1923 (87 y.o. F) Treating RN: Carlene Coria Primary Care Provider: Adrian Prows Other Clinician: Referring Provider: Adrian Prows Treating Provider/Extender: Skipper Cliche in Treatment: 26 Active Problems ICD-10 Encounter Code Description Active Date MDM Diagnosis 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, stage 3 05/13/2021 No Yes I89.0 Lymphedema, not elsewhere classified 05/13/2021 No Yes I87.2 Venous insufficiency (chronic) (peripheral) 05/13/2021 No Yes S80.12XA Contusion of left lower leg, initial encounter 11/10/2021 No Yes Z95.0 Presence of cardiac pacemaker 05/13/2021 No Yes Z79.01 Long term (current) use of anticoagulants 05/13/2021 No Yes Inactive Problems Resolved Problems Electronic Signature(s) Signed:  11/17/2021 10:54:51 AM By: Worthy Keeler PA-C Entered By: Worthy Keeler on 11/17/2021 10:54:51 [place], [name] ([id number]) -------------------------------------------------------------------------------- Progress Note Details Patient Name: [name] Date of Service: 11/17/2021 10:15 AM Medical Record Number: [id number] Patient Account Number: [id number] Date of Birth/Sex: 01-21-1923 (87 y.o. 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 Dr. Amalia Hailey the podiatrist saw her on 07/20/2017 and sent her to the wound center for possible hyperbaric oxygen therapy. past medical history of peripheral vascular disease, varicose veins, status post appendectomy, basal cell carcinoma excision from the left leg, cholecystectomy, pacemaker placement, right lower extremity angiography done by Dr. dew in March 2017 with placement of a stent. there is also note of a successful ablation of the right small saphenous vein done which was reviewed by ultrasound on 10/24/2016. the patient had a right small saphenous vein ablation done on 10/20/2016. The patient has never been a smoker. She has been seen by Dr. Corene Cornea dew the vascular surgeon who most recently saw her on 06/15/2017 for evaluation of ongoing problems with right leg swelling. She had a lower extremity arterial duplex examination done(02/13/17) which showed patent distal right superficial femoral artery stent and above-the-knee popliteal stent without evidence of restenosis. The ABI was more than  1.3 on the right and more than 1.3 on the left. This was consistent with noncompressible arteries due to medial calcification. The right great toe pressure and PPG waveforms are within normal limits and the left great toe pressure and PPG waveforms are decreased. he recommended she continue to wear her compression stockings and continue with elevation. She is scheduled to have a noninvasive arterial study in the near future 08/16/2017 -- had a lower extremity arterial duplex examination done which showed patent distal right superficial femoral artery stent and above-the-knee popliteal stent without evidence of restenosis. The ABI was more than 1.3 on the right and more than 1.3 on the left. This was consistent with noncompressible arteries due to medial calcification. The right great toe pressure and PPG waveforms are within normal limits and the left great toe pressure and PPG waveforms are decreased. the x-ray of the right ankle has not yet been done 08/24/2017 -- had a right ankle x-ray -- IMPRESSION:1. No fracture, bone lesion or evidence of osteomyelitis. 2. Lateral soft tissue swelling with a soft tissue ulcer. she has not yet seen the vascular surgeon for review 08/31/17 on evaluation today patient's wound appears to be showing signs of improvement. She still with her appointment with vascular in order to review her results of her vascular study and then determine if any intervention would be recommended at that time. No fevers, chills, nausea, or vomiting noted at this time. She has been tolerating the dressing changes without complication. 09/28/17 on evaluation today patient's wound appears to show signs of good improvement in regard to the granulation tissue which is surfacing. There is still a layer of slough covering the wound and the posterior portion is still significantly deeper than the anterior nonetheless there has been some good sign of things moving towards the better. She is  going to go back to Dr. dew for reevaluation to ensure her blood flow is still appropriate. That will be before her next evaluation with [place] next week. No fevers, chills, nausea, or vomiting noted at this time. Patient does have some discomfort rated to be a 3-4/10 depending on activity specifically cleansing the wound makes it worse. 10/05/2017 -- the patient was seen by Dr. Lucky Cowboy last week and noninvasive studies showed a normal right ABI with brisk triphasic waveforms consistent with no arterial insufficiency including normal digital pressures. The duplex showed a patent  distal right SFA stent and the proximal SFA was also normal. He was pleased with her test and thought she should have enough of perfusion for normal wound healing. He would see her back in 6 months time. 12/21/17 on evaluation today patient appears to be doing fairly well in regard to her right lateral ankle wound. Unfortunately the main issue that she is expansion at this point is that she is having some issues with what appears to be some cellulitis in the right anterior shin. She has also been noting a little bit of uncomfortable feeling especially last night and her ankle area. I'm afraid that she made the developing a little bit of an infection. With that being said I think it is in the early stages. 12/28/17 on evaluation today patient's ankle appears to be doing excellent. She's making good progress at this point the cellulitis seems to have improved after last week's evaluation. Overall she is having no significant discomfort which is excellent news. She does have an appointment with Dr. dew on March 29, 2018 for reevaluation in regard to the stent he placed. She seems to have excellent blood flow in the right lower extremity. 01/19/12 on evaluation today patient's wound appears to be doing very well. In fact she does not appear to require debridement at this point, there's no evidence of infection, and overall from the  standpoint of the wound she seems to be doing very well. With that being said I believe that it may be time to switch to different dressing away from the [facility] Dressing she tells me she does have a lot going on her friend actually passed away yesterday and she's also having a lot of issues with her husband this obviously is weighing heavy on her as far as your thoughts and concerns today. 01/25/18 on evaluation today patient appears to be doing fairly well in regard to her right lateral malleolus. She has been tolerating the dressing changes without complication. Overall I feel like this is definitely showing signs of improvement as far as how the overall appearance of the wound is there's also evidence of epithelium start to migrate over the granulation tissue. In general I think that she is progressing nicely as far as the wound is concerned. The only concern she really has is whether or not we can switch to every other week visits in order to avoid having as many JETTIE, MANNOR ([id number]) appointments as her daughters have a difficult time getting her to her appointments as well as the patient's husband to his he is not doing very well at this point. 02/22/18 on evaluation today patient's right lateral malleolus ulcer appears to be doing great. She has been tolerating the dressing changes without complication. Overall you making excellent progress at this time. Patient is having no significant discomfort. 03/15/18 on evaluation today patient appears to be doing much more poorly in regard to her right lateral ankle ulcer at this point. Unfortunately since have last seen her her husband has passed just a few days ago is obviously weighed heavily on her her daughter also had surgery well she is with her today as usual. There does not appear to be any evidence of infection she does seem to have significant contusion/deep tissue injury to the right lateral malleolus which was not noted  previous when I saw her last. It's hard to tell of exactly when this injury occurred although during the time she was spending the night in the hospital this may  have been most likely. 03/22/18 on evaluation today patient appears to actually be doing very well in regard to her ulcer. She did unfortunately have a setback which was noted last week however the good news is we seem to be getting back on track and in fact the wound in the core did still have some necrotic tissue which will be addressed at this point today but in general I'm seeing signs that things are on the up and up. She is glad to hear this obviously she's been somewhat concerned that due to the how her wound digressed more recently. 03/29/18 on evaluation today patient appears to be doing fairly well in regard to her right lower extremity lateral malleolus ulcer. She unfortunately does have a new area of pressure injury over the inferior portion where the wound has opened up a little bit larger secondary to the pressure she seems to be getting. She does tell me sometimes when she sleeps at night that it actually hurts and does seem to be pushing on the area little bit more unfortunately. There does not appear to be any evidence of infection which is good news. She has been tolerating the dressing changes without complication. She also did have some bruising in the left second and third toes due to the fact that she may have bump this or injured it although she has neuropathy so she does not feel she did move recently that may have been where this came from. Nonetheless there does not appear to be any evidence of infection at this time. 04/12/18 on evaluation today patient's wound on the right lateral ankle actually appears to be doing a little bit better with a lot of necrotic docking tissue centrally loosening up in clearing away. However she does have the beginnings of a deep tissue injury on the left lateral malleolus likely due to  the fact we've been trying offload the right as much as we have. I think she may benefit from an assistive soft device to help with offloading and it looks like they're looking at one of the doughnut conditions that wraps around the lower leg to offload which I think will definitely do a good job. With that being said I think we definitely need to address this issue on the left before it becomes a wound. Patient is not having significant pain. 04/19/18 on evaluation today patient appears to be doing excellent in regard to the progress she's made with her right lateral ankle ulcer. The left ankle region which did show evidence of a deep tissue injury seems to be resolving there's little fluid noted underneath and a blister there's nothing open at this point in time overall I feel like this is progressing nicely which is good news. She does not seem to be having significant discomfort at this point which is also good news. 04/25/18-She is here in follow up evaluation for bilateral lateral malleolar ulcers. The right lateral malleolus ulcer with pale subcutaneous tissue exposure, central area of ulcer with tendon/periosteum exposed. The left lateral malleolus ulcer now with central area of nonviable tissue, otherwise deep tissue injury. She is wearing compression wraps to the left lower extremity, she will place the right lower extremity compression wraps on when she gets home. She will be out of town over the weekend and return next week and follow-up appointment. She completed her doxycycline this morning 05/03/18 on evaluation today patient appears to be doing very well in regard to her right lateral ankle ulcer in general.  At least she's showing some signs of improvement in this regard. Unfortunately she has some additional injury to the left lateral malleolus region which appears to be new likely even over the past several days. Again this determination is based on the overall appearance. With that  being said the patient is obviously frustrated about this currently. 05/10/18-She is here in follow-up evaluation for bilateral lateral malleolar ulcers. She states she has purchased offloading shoes/boots and they will arrive tomorrow. She was asked to bring them in the office at next week's appointment so her provider is aware of product being utilized. She continues to sleep on right or left side, she has been encouraged to sleep on her back. The right lateral malleolus ulcer is precariously close to peri-osteum; will order xray. The left lateral malleolus ulcer is improved. Will switch back to santyl; she will follow up next week. 05/17/18 on evaluation today patient actually appears to be doing very well in regard to her malleolus her ulcers compared to last time I saw them. She does not seem to have as much in the way of contusion at this point which is great news. With that being said she does continue to have discomfort and I do believe that she is still continuing to benefit from the offloading/pressure reducing boots that were recommended. I think this is the key to trying to get this to heal up completely. 05/24/18 on evaluation today patient actually appears to be doing worse at this point in time unfortunately compared to her last week's evaluation. She is having really no increased pain which is good news unfortunately she does have more maceration in your theme and noted surrounding the right lateral ankle the left lateral ankle is not really is erythematous I do not see signs of the overt cellulitis on that side. Unfortunately the wounds do not seem to have shown any signs of improvement since the last evaluation. She also has significant swelling especially on the right compared to previous some of this may be due to infection however also think that she may be served better while she has these wounds by compression wrapping versus continuing to use the Juxta-Lite for the time being.  Especially with the amount of drainage that she is experiencing at this point. No fevers, chills, nausea, or vomiting noted at this time. 05/31/18 on evaluation today patient appears to actually be doing better in regard to her right lateral lower extremity ulcer specifically on the malleolus region. She has been tolerating the antibiotic without complication. With that being said she still continues to have issues but a little bit of redness although nothing like she what she was experiencing previous. She still continues to pressure to her ankle area she did get the problem on offloading boots unfortunately she will not wear them she states there too uncomfortable and she can't get in and out of the bed. Nonetheless at this point her wounds seem to be continually getting worse which is not what we want I'm getting somewhat concerned about her progress and how things are going to proceed if we do not intervene in some way shape or form. I therefore had a very lengthy conversation today about offloading yet again and even made a specific suggestion for switching her to a memory foam mattress and even gave the information for a specific one that they could look at getting if it was something that they were interested in considering. She does not want to be considered for a hospital  bed air mattress although honestly insurance would not cover it that she does not have any wounds on her trunk. 06/14/18 on evaluation today both wounds over the bilateral lateral malleolus her ulcers appear to be doing better there's no evidence of pressure injury at this point. She did get the foam mattress for her bed and this does seem to have been extremely beneficial for her in my pinion. Her daughter states that she is having difficulty getting out of bed because of how soft it is. The patient also relates this to be. Nonetheless I do feel like she's actually doing better. Unfortunately right after and around the time  she was getting the mattress she also sustained a fall when she got up to go pick up the phone and ended up injuring her right elbow she has 18 sutures in place. We are not caring for this currently although home health is going to be taking the sutures out shortly. Nonetheless this may be something that we need to evaluate going forward. It depends on [name], [name] ([id number]) how well it has or has not healed in the end. She also recently saw an orthopedic specialist for an injection in the right shoulder just before her fall unfortunately the fall seems to have worsened her pain. 06/21/18 on evaluation today patient appears to be doing about the same in regard to her lateral malleolus ulcers. Both appear to be just a little bit deeper but again we are clinging away the necrotic and dead tissue which I think is why this is progressing towards a deeper realm as opposed to improving from my measurement standpoint in that regard. Nonetheless she has been tolerating the dressing changes she absolutely hates the memory foam mattress topper that was obtained for her nonetheless I do believe this is still doing excellent as far as taking care of excess pressure in regard to the lateral malleolus regions. She in fact has no pressure injury that I see whereas in weeks past it was week by week I was constantly seeing new pressure injuries. Overall I think it has been very beneficial for her. 07/03/18; patient arrives in my clinic today. She has deep punched out areas over her bilateral lateral malleoli. The area on the right has some more depth. We spent a lot of time today talking about pressure relief for these areas. This started when her daughter asked for a prescription for a memory foam mattress. I have never written a prescription for a mattress and I don't think insurances would pay for that on an ordinary bed. In any case he came up that she has foam boots that she refuses to wear. I would  suggest going to these before any other offloading issues when she is in bed. They say she is meticulous about offloading this the rest of the day 07/10/18- She is seen in follow-up evaluation for bilateral, lateral malleolus ulcers. There is no improvement in the ulcers. She has purchased and is sleeping on a memory foam mattress/overlay, she has been using the offloading boots nightly over the past week. She has a follow up appointment with vascular medicine at the end of October, in my opinion this follow up should be expedited given her deterioration and suboptimal TBI results. We will order plain film xray of the left ankle as deeper structures are palpable; would consider having MRI, regardless of xray report(s). The ulcers will be treated with iodoflex/iodosorb, she is unable to safely change the dressings daily with santyl.  07/19/18 on evaluation today patient appears to be doing in general visually well in regard to her bilateral lateral malleolus ulcers. She has been tolerating the dressing changes without complication which is good news. With that being said we did have an x-ray performed on 07/12/18 which revealed a slight loosen see in the lateral portion of the distal left fibula which may represent artifact but underline lytic destruction or osteomyelitis could not be excluded. MRI was recommended. With that being said we can see about getting the patient scheduled for an MRI to further evaluate this area. In fact we have that scheduled currently for August 20 19,019. 07/26/18 on evaluation today patient's wound on the right lateral ankle actually appears to be doing fairly well at this point in my pinion. She has made some good progress currently. With that being said unfortunately in regard to the left lateral ankle ulcer this seems to be a little bit more problematic at this time. In fact as I further evaluated the situation she actually had bone exposed which is the first time that's been  the case in the bone appear to be necrotic. Currently I did review patient's note from Dr. Bunnie Domino office with [facility]. He stated that ABI was 1.26 on the right and 0.95 on the left with good waveforms. Her perfusion is stable not reduced from previous studies and her digital waveforms were pretty good particularly on the right. His conclusion upon review of the note was that there was not much she could do to improve her perfusion and he felt she was adequate for wound healing. His suggestion was that she continued to see [place] and consider a synthetic skin graft if there was no underlying infection. He plans to see her back in six months or as needed. 08/01/18 on evaluation today patient appears to be doing better in regard to her right lateral ankle ulcer. Her left lateral ankle ulcer is about the same she still has bone involvement in evidence of necrosis. There does not appear to be evidence of infection at this time On the right lateral lower extremity. I have started her on the Augmentin she picked this up and started this yesterday. This is to get her through until she sees infectious disease which is scheduled for 08/12/18. 08/06/18 on evaluation today patient appears to be doing rather well considering my discussion with patient's daughter at the end of last week. The area which was marked where she had erythema seems to be improved and this is good news. With that being said overall the patient seems to be making good improvement when it comes to the overall appearance of the right lateral ankle ulcer although this has been slow she at least is coming around in this regard. Unfortunately in regard to the left lateral ankle ulcer this is osteomyelitis based on the pathology report as well is bone culture. Nonetheless we are still waiting CT scan. Unfortunately the MRI we originally ordered cannot be performed as the patient is a pacemaker which I had overlooked. Nonetheless  we are working on the CT scan approval and scheduling as of now. She did go to the hospital over the weekend and was placed on IV Cefzo for a couple of days. Fortunately this seems to have improved the erythema quite significantly which is good news. There does not appear to be any evidence of worsening infection at this time. She did have some bleeding after the last debridement therefore I did not  perform any sharp debridement in regard to left lateral ankle at this point. Patient has been approved for a snap vac for the right lateral ankle. 08/14/18; the patient with wounds over her bilateral lateral malleoli. The area on the right actually looks quite good. Been using a snap back on this area. Healthy granulation and appears to be filling in. Unfortunately the area on the left is really problematic. 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 Also noted to have cellulitis. She saw Dr. Novella Olive of infectious disease today and was put on linezolid. We are able to verify this with her pharmacy. She is completed the Augmentin that she was already on. We've been using Iodoflex to this area 08/23/18 on evaluation today patient's wounds both actually appear to be doing better compared to my prior evaluations. Fortunately she showing signs of good improvement in regard to the overall wound status especially where were using the snap vac on the right. In regard to left lateral malleolus the wound bed actually appears to be much cleaner than previously noted. I do not feel any phone directly probed during evaluation today and though there is tendon noted this does not appear to be necrotic it's actually fairly good as far as the overall appearance of the tendon is concerned. In general the wound bed actually appears to be doing significantly better than it was previous. Patient is currently in the care of [name] and I did review  that note today. He actually has her on two weeks of linezolid and then following the patient will be on 1-2 months of Keflex. That is the plan currently. She has been on antibiotics therapy as prescribed by myself initially starting on July 30, 2018 and has been on that continuously up to this point. 08/30/18 on evaluation today patient actually appears to be doing much better in regard to her right lateral malleolus ulcer. She has been tolerating the dressing changes specifically the snap vac without complication although she did have some issues with the seal currently. Apparently there was some trouble with getting it to maintain over the past week past Sunday. Nonetheless overall the wound appears better in regard to the right lateral malleolus region. In regard to left lateral malleolus this actually show some signs of additional granulation although there still tendon noted in the base of the wound this appears to be healthy not necrotic in any way whatsoever. We are considering potentially using a snap vac for the left lateral malleolus as well the product wrap from [employer], [place], was present in the clinic today we're going to see this patient I did have her come in with me after obtaining consent from the patient and her daughter in order to look at the wound and see if there's any recommendation one way or another as to whether or not they felt the snapback could be beneficial for the left lateral malleolus region. But JULIE, NAY ([id number]) the conclusion was that it might be but that this is definitely a little bit deeper wound than what traditionally would be utilized for a snap vac. 09/06/18 on evaluation today patient actually appears to be doing excellent in my pinion in regard to both ankle ulcers. She has been tolerating the dressing changes without complication which is great news. Specifically we have been using the snap vac. In regard to the right ankle I'm not even sure  that this is  going to be necessary for today and following as the wound has filled in quite nicely. In regard to the left ankle I do believe that we're seeing excellent epithelialization from the edge as well as granulation in the central portion the tendon is still exposed but there's no evidence of necrotic bone and in general I feel like the patient has made excellent progress even compared to last week with just one week of the snap vac. 09/11/18; this is a patient who has wounds on her bilateral lateral malleoli. Initially both of these were deep stage IV wounds in the setting of chronic arterial insufficiency. She has been revascularized. As I understand think she been using snap vacs to both of these wounds however the area on the right became more superficial and currently she is only using it on the left. Using silver collagen on the right and silver collagen under the back on the left I believe 09/19/18 on evaluation today patient actually appears to be doing very well in regard to her lateral malleolus or ulcers bilaterally. She has been tolerating the dressing changes without complication. Fortunately there does not appear to be any evidence of infection at this time. Overall I feel like she is improving in an excellent manner and I'm very pleased with the fact that everything seems to be turning towards the better for her. This has obviously been a long road. 09/27/18 on evaluation today patient actually appears to be doing very well in regard to her bilateral lateral malleolus ulcers. She has been tolerating the dressing changes without complication. Fortunately there does not appear to be any evidence of infection at this time which is also great news. No fevers, chills, nausea, or vomiting noted at this time. Overall I feel like she is doing excellent with the snap vac on the left malleolus. She had 40 mL of fluid collection over the past week. 10/04/18 on evaluation today patient  actually appears to be doing well in regard to her bilateral lateral malleolus ulcers. She continues to tolerate the dressing changes without complication. One issue that I see is the snap vac on the left lateral malleolus which appears to have sealed off some fluid underlying this area and has not really allowed it to heal to the degree that I would like to see. For that reason I did suggest at this point we may want to pack a small piece of packing strip into this region to allow it to more effectively wick out fluid. 10/11/18 in general the patient today does not feel that she has been doing very well. She's been a little bit lethargic and subsequently is having bodyaches as well according to what she tells me today. With that being said overall she has been concerned with the fact that something may be worsening although to be honest her wounds really have not been appearing poorly. She does have a new ulcer on her left heel unfortunately. This may be pressure related. Nonetheless it seems to me to have potentially started at least as a blister I do not see any evidence of deep tissue injury. In regard to the left ankle the snap vac still seems to be causing the ceiling off of the deeper part of the wound which is in turn trapping fluid. I'm not extremely pleased with the overall appearance as far as progress from last week to this week therefore I'm gonna discontinue the snap vac at this point. 10/18/18 patient unfortunately this point has not been  feeling well for the past several days. She was seen by Grayland Ormond her primary care provider who is a Librarian, academic at [facility]. Subsequently she states that she's been very weak and generally feeling malaise. No fevers, chills, nausea, or vomiting noted at this time. With that being said bloodwork was performed at the PCP office on the 11th of this month which showed a white blood cell count of 10.7. This was repeated today and shows a  white blood cell count of 12.4. This does show signs of worsening. Coupled with the fact that she is feeling worse and that her left ankle wound is not really showing signs of improvement I feel like this is an indication that the osteomyelitis is likely exacerbating not improving. Overall I think we may also want to check her C-reactive protein and sedimentation rate. Actually did call [facility] office this afternoon while the patient was in the office here with me. Subsequently based on the findings we discussed treatment possibilities and I think that it is appropriate for [place] to go ahead and initiate treatment with doxycycline which I'm going to do. Subsequently he did agree to see about adding a CRP and sedimentation rate to her orders. If that has not already been drawn to where they can run it they will contact the patient she can come back to have that check. They are in agreement with plan as far as the patient and her daughter are concerned. Nonetheless also think we need to get in touch with Dr. Henreitta Leber office to see about getting the patient scheduled with him as soon as possible. 11/08/18 on evaluation today patient presents for follow-up concerning her bilateral foot and ankle ulcers. I did do an extensive review of her chart in epic today. Subsequently she was seen by [name] he did initiate Cefepime IV antibiotic therapy. Subsequently she had some issues with her PICC line this had to be removed because it was coiled and then replaced. Fortunately that was now settled. Unfortunately she has continued have issues with her left heel as well as the issues that she is experiencing with her bilateral lateral malleolus regions. I do believe however both areas seem to be doing a little bit better on evaluation today which is good news. No fevers, chills, nausea, or vomiting noted at this time. She actually has an angiogram schedule with Dr. dew on this coming Monday, November 11, 2018.  Subsequently the patient states that she is feeling much better especially than what she was roughly 2 weeks ago. She actually had to cancel an appointment because she was feeling so poorly. No fevers, chills, nausea, or vomiting noted at this time. 11/15/18 on evaluation today patient actually is status post having had her angiogram with Dr. dew Monday, four days ago. 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 He had to go and work on several areas of the vasculature fortunately he was able to obtain no more than a 30% residual stenosis throughout post procedure. I reviewed this note today. I think this will definitely help with healing at this time. Fortunately there does not appear to be any signs of infection and I do feel like ratio already has a better appearance to it. 11/22/18 upon evaluation today patient actually appears to be doing very well in regard to her wounds in general. The right lateral malleolus looks excellent the heel looks better in the left lateral malleolus also appears to be  doing a little better. With that being said the right second toe actually appears to be open and training we been watching this is been dry and stable but now is open. 12/03/2018 Seen today for follow-up and management of multiple bilateral lower extremity wounds. New pressure injury of the great toe which is closed at this time. Wound of the right distal second toe appears larger today with deep undermining and a pocket of fluid present within the undermining region. Left and right malleolus is wounds are stable today with no signs and symptoms of infection.Denies any needs or concerns during exam today. 12/13/18 on evaluation today patient appears to be doing somewhat better in regard to her left heel ulcer. She also seems to be completely healed in regard to the right lateral malleolus ulcer. The left malleolus ulcer is smaller what unfortunately the wounds which are new over  the first and second toes of the right foot are what are most concerning at this point especially the second. Both areas did require sharp debridement today. 12/20/18 on evaluation today patient's wound actually appears to be doing better in regard to left lateral ankle and her right lateral ankle continues to remain healed. The hill ulcer on the left is improved. She does have improvement noted as well in regard to both toe ulcers. Overall I'm very pleased in this regard. No fevers, chills, nausea, or vomiting noted at this time. [name], [name] ([id number]) 12/23/18 on evaluation today patient is seen after she had her toenails trimmed at the podiatrist office due to issues with her right great toe. There was what appeared to be dark eschar on the surface of the wound which had her in the podiatrist concerned. Nonetheless as I remember that during the last office visit I had utilize silver nitrate of this area I was much less concerned about the situation. Subsequently I was able to clean off much of this tissue without any complication today. This does not appear to show any signs of infection and actually look somewhat better compared to last time post debridement. Her second toe on the right foot actually had callous over and there did appear still be some fluid underneath this that would require debridement today. 12/27/18 on evaluation today patient actually appears to be showing signs of improvement at all locations. Even the left lateral ankle although this is not quite as great as the other sites. Fortunately there does not appear to be any signs of infection at this time and both of her toes on the right foot seem to be showing signs of improvement which is good news and very pleased in this regard. 01/03/19 on evaluation today patient appears to be doing better for the most part in regard to her wounds in particular. There does not appear to be any evidence of infection at this time which  is good news. Fortunately there is no sign of really worsening anywhere except for the right great toe which she does have what appears to be a bruise/deep tissue injury which is very superficial and already resolving. I'm not sure where this came from I questioned her extensively and she does not recall what may have happened with this. Other than that the patient seems to be doing well even the left lateral ankle ulcer looks good and is getting smaller. 01/10/19 on evaluation today patient appears to be doing well in regard to her left heel wound and both of her toe wounds. Overall I feel like there is  definitely improvement here and I'm happy in that regard. With that being said unfortunately she is having issues with the left lateral malleolus ulcer which unfortunately still has a lot of depth to it. This is gonna be a very difficult wound for [place] to be able to truly get to heal. I may want to consider some type of skin substitute to see if this would be of benefit for her. I'll discuss this with her more the next visit most likely. This was something I thought about more at the end of the visit when I was Artie out of the room and the patient had been discharged. 01/17/19 on evaluation today patient appears to be doing very well in regard to her wounds in general. She's been making excellent progress at this time. Fortunately there's no sign of infection at this time either. No fevers, chills, nausea, or vomiting noted at this time. The biggest issue is still her left lateral malleolus where it appears to be doing well and is getting smaller but still shows a small corner where this is deeper and goes down into what appears to be the joint space. Nonetheless this is taking much longer to heal although it still looks better in smaller than previous evaluations. 01/24/19 on evaluation today patient's wounds actually appear to be doing rather well in general overall. She did require some sharp  debridement in regard to the right great toe but everything else appears to be doing excellent no debridement was even necessary. No fevers, chills, nausea, or vomiting noted at this time. 01/31/19 on evaluation today patient actually appears to be doing much better in regard to her left foot wound on the heel as well as the ankle. The right great toe appears to be a little bit worse today this had callous over and trapped a lot of fluid underneath. Fortunately there's no signs of infection at any site which is great news. 02/07/19 on evaluation today patient actually appears to be doing decently well in regard to all of her ulcers at this point. No sharp debridement was required she is a little bit of hyper granulation in regard to the left lateral ankle as well as the left heel but the hill itself is almost completely healed which is excellent news. Overall been very pleased in this regard. 02/14/19 on evaluation today patient actually appears to be doing very well in regard to her ulcers on the right first toe, left lateral malleolus, and left heel. In fact the heel is almost completely healed at this point. The patient does not show any signs of infection which is good news. Overall very pleased with how things have progressed. 04/18/19 Telehealth Evaluation During the COVID-19 National Emergency: Verbal Consent: Obtained from patient Allergies: reviewed and the active list is current. Medication changes: patient has no current medication changes. COVID-19 Screening: 1. Have you traveled internationally or on a cruise ship in the last 14 dayso No 2. Have you had contact with someone with or under investigation for COVID-19o No 3. Have you had a fever, cough, sore throat, or experiencing shortness of breatho No on evaluation today actually did have a visit with this patient through a telehealth encounter with her home health nurse. Subsequently it was noted that the patient actually appears to be  doing okay in regard to her wounds both the right great toe as well as the left lateral malleolus have shown signs of improvement although this in your theme around the left lateral malleolus  there eschar coverings for both locations. The question is whether or not they are actually close and whether or not home health needs to discharge the patient or not. Nonetheless my concern is this point obviously is that without actually seeing her and being able to evaluate this directly I cannot ensure that she is completely healed which is the question that I'm being asked. 04/22/19 on evaluation today patient presents for her first evaluation since last time I saw her which was actually February 14, 2019. I did do a telehealth visit last week in which point it was questionable whether or not she may be healed and had to bring her in today for confirmation. With that being said she does seem to be doing quite well at this point which is good news. There does not appear to be any drainage in the deed I believe her wounds may be healed. Readmission: 09/04/2019 on evaluation today patient appears to be doing unfortunately somewhat more poorly in regard to her left foot ulcer secondary to a wound that began on 08/21/2019 at least when she first noticed this. Fortunately she has not had any evidence of active infection at this time. Systemically. I also do not necessarily see any evidence of infection at the blister/wound site on the first metatarsal head plantar aspect. This almost appears to be something that may have just rubbed inappropriately causing this to breakdown. They did not want a wait too long to come in to be seen as again she had significant issues in the past with wounds that took quite a while to heal in fact it was close to 2 years. Nonetheless this does not appear to be quite that bad but again we do need to remove some of the necrotic tissue from the surface of the wound to tell exactly the  extent. She does not appear to have any significant arterial disease at this point and again her last ABIs and TBI's are recorded above in the alert section her left ABI was 1.27 with a TBI of 0.72 to the right ABI 1.08 with a TBI of 0.39. Other than this the patient has been doing quite [name], [name] ([id number]) well since I last saw her and that was in May 2020. 09/11/2019 on evaluation today patient appeared to be doing very well with regard to her plantar foot ulcer on the left. In fact this appears to be almost completely healed which is awesome. That is after just 1 week of intervention. With that being said there is no signs of active infection at this time. 09/18/2019 on evaluation today patient actually appears to be doing excellent in fact she is completely healed based on what I am seeing at this point. Fortunately there is no signs of active infection at this time and overall patient is very pleased to hear that this area has healed so quickly. Readmission: 05/13/2021 upon evaluation today this patient presents for reevaluation here in the clinic. This is a wound that actually we previously took care of. She had 1 on the right ankle and the left the left turned out to be be harder due to to heal but nonetheless is doing great at this point as the right that has reopened and it was noted first just several weeks ago with a scab over it and came off in just the past few days. Fortunately there does not appear to be any obvious evidence of significant active infection at this time which is great news. No  fevers, chills, nausea, vomiting, or diarrhea. 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 There does not appear to be any signs of this interfering in any way with her wound. She does have swelling we previously had compression socks for her ordered but again it does not look like she wears these on a regular basis by any means. 05/26/2021  upon evaluation today patient appears to be doing well with regard to her wound which is actually showing signs of excellent improvement. There does not appear to be any signs of active infection which is great news and overall very pleased with where things stand today. No fevers, chills, nausea, vomiting, or diarrhea. 06/02/2021 upon evaluation today patient's wound actually showing signs of excellent improvement. Fortunately there does not appear to be any signs of active infection which is great news. I think the patient is making good progress with regard to her wounds in general. 06/09/2021 upon evaluation today patient appears to be doing excellent in regard to her wounds currently. Fortunately there does not appear to be any signs of active infection which is great news. No fevers, chills, nausea, vomiting, or diarrhea. Overall extremely pleased with where things stand today. I think the patient is making excellent progress. 06/16/2021 upon evaluation today patient appears to be doing well in regard to her wound. This is going require little bit of debridement today and that was discussed with the patient. Otherwise she seems to be doing quite well and I am actually very pleased with where things stand at this point. No fevers, chills, nausea, vomiting, or diarrhea. 06/23/2021 upon evaluation today patient appears to be doing well with regard to her wounds. She has been tolerating the dressing changes without complication. Fortunately there does not appear to be any evidence of infection and she has not had air in her home which she actually lives at an assisted living that got fixed this morning. With that being said because of that her wrap has been extremely hot and bothersome for her over the past week. 06/30/2021 upon evaluation today patient is actually making excellent progress in regard to her ankle ulcer. She has been tolerating the dressing changes without complication and overall  extremely pleased with where things stand there does not appear to be any evidence of active infection which is great news. No fevers, chills, nausea, vomiting, or diarrhea. 07/07/21 upon evaluation today patients and culture on the right actually appears to be doing quite well. There does not appear to be any signs of infection and overall very pleased with where things stand today. No fevers, chills, nausea, or vomiting noted at this time. 07/14/2021 unfortunately the patient today has some evidence of deep tissue injury and pressure getting to the ankle region. Again I am not exactly sure what is going on here but this is very similar to issues that we have had in the past. I explained to the patient that she needs to be very mindful of exactly what is happening I think sleeping in bed is probably the main issue here although there could be other culprits I am not sure what else would potentially lead to this kind of a problem for her. 07/21/2021 upon evaluation today patient's wound actually showing signs of improvement compared to last week. Fortunately there does not appear to be any signs of active infection which is great news and overall very pleased with where things stand in that regard. With that being  said I do believe that she is continuing to show signs of overall of getting better although I think this is still basically about what we were 2 weeks ago due to the worsening and now improvement. 07/28/2021 upon evaluation today patient appears to be doing well with regard to her wound. She does have some slough buildup on the surface of the wound which I would have to manage today. Fortunately there is no sign of active infection at this time. No fevers, chills, nausea, vomiting, or diarrhea. 08/04/2021 upon evaluation today patient appears to be doing about the same in regard to her wound. To be perfectly honest I am beginning to be a little bit concerned about the overall appearance of the  wound bed. I do think possibly taking a sample right around the margin of the wound could be beneficial for her as far as identifying anything such as an inflammatory process or to be honest even a skin tag cancer type process that may be of concern here. Fortunately there does not appear to be any evidence of active infection at this time which is great news she is not having any pain also great news. 08/11/2021 upon evaluation today patient appears to be doing well with regard to her wound. The good news is I did review her biopsy results and it showed some inflammatory mixed findings but nothing that appeared to be malignant which is great news. Overall this is more of a chronic venous stasis type issue which again is more what we have been treating. Nonetheless I just wanted to make sure before going forward that there was not anything more untoward going on at this point. 08/18/2021 upon evaluation today patient appears to be doing well with regard to her ankle ulcer. Fortunately there does not appear to be any signs of active infection at this time which is great overall wound is dramatically improved compared to last week. Since last week I have actually placed her on doxycycline and subsequently this is a good option as far as the findings are concerned at this point. 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 the doxycycline is doing that. There does not appear to be any evidence of active infection systemically which is great news. 08/25/2021 upon evaluation today patient appears to be doing well with regard to her wound. I feel like we are finally get back on track as far as healing is concerned I am much happier with the overall appearance today. I do think that she is tolerating the dressing changes without complication which is great news. We have been using  Hydrofera Blue which I think is a good option. The good news is she is also doing great in [place], SARAANN ENNEKING. ([id number]) regard to her compression sock on the left which is a zipper compression that seems to be doing a great job keeping her edema under good control. 09/01/2021 upon evaluation today patient appears to be doing well with regard to her wound. Infection seems to be under much better control which is great news and very pleased in that regard. Fortunately there does not appear to be any signs of infection currently. 09/08/2021 upon evaluation today patient actually appears to be making good progress in regard to her wound. She has been tolerating the dressing changes without complication. Fortunately there does not appear to be any  evidence of active infection at this time which is great news as well. No fevers, chills, nausea, vomiting, or diarrhea. 09/22/2021 upon evaluation today patient appears to be doing well with regard to her wound although is very slow to heal. We have not looked into Apligraf yet I think that is something that we should see about doing. 09/29/2021 upon evaluation today patient's wound is actually showing signs of doing about the same. I am not seeing any evidence of worsening but also no significant evidence of improvement. We did gain approval for the organogenesis products all except for Apligraf as covered by her insurance. With that being said I do think that we can go ahead and proceed with the NuShield if the patient and her family in agreement of the plan I discussed that with him today she does have a 20% coinsurance which we also discussed. 10/06/2021 upon evaluation today patient appears to unfortunately be doing a little bit worse she appears to be infected based on what I am seeing. Fortunately there does not appear to be any signs of active infection at this time which is great news. Unfortunately it does appear to be some evidence of around the  wound edge indicated by way of erythema and warmth as well as redness 10/13/2021 upon evaluation today patient actually appears to be doing excellent in regard to her ankle ulcer compared to what it was. 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 I think were fine to go ahead and apply the NuShield today. 10/20/2021 upon evaluation today patient appears to be doing excellent in fact the NuShield seems to have done all some for her thus far. I am actually very pleased with where we stand and overall I think that she is making great progress. There is no evidence of active infection at this time. 11/23; patient presents for follow-up. She has no issues or complaints today. She denies signs of infection. She reports stability in her wound healing. 11/03/2021 upon evaluation today patient appears to be doing well with regard to her wounds. She has been tolerating the dressing changes without complication. Fortunately there is no signs of active infection at this time. 11/10/2021 upon evaluation today patient appears to be doing well with regard to her right ankle which is actually showing signs of improvement with the NuShield I am very pleased. Subsequently in regards to the left ankle this appears to be doing okay with no evidence of issue here either. With that being said she has a significant contusion on the left leg from having fallen 2 days ago. She tells me that she was using her walker going to the closet and then when she got to the closet turned around to get something out at which point she fell according to the story. Nonetheless she does have a hematoma just below her knee on the anterior portion of her shin. My hope is that this will not open until wound although we do need I think Some compression  over it also think that we need to have her use an ice as well to help with the swelling and prevent this from getting worse. The last thing she needs is a wound opened up here. 11/17/2021 upon evaluation patient's right ankle actually showing signs of improvement based on what I  see currently there is a lot of new skin growth coming in which is great news. Nonetheless I do feel like that the patient is showing improvement as well in regard to her left leg with the use of the Tubigrip it swollen but not as bruised as what I would have expected after what I was seeing last week. Nonetheless I do think doubling up on the Tubigrip would probably be beneficial to try to keep some of the edema under better control here. Objective Constitutional Well-nourished and well-hydrated in no acute distress. Vitals Time Taken: 10:26 AM, Height: 62 in, Weight: 150 lbs, BMI: 27.4, Temperature: 98.2 F, Pulse: 77 bpm, Respiratory Rate: 18 breaths/min, Blood Pressure: 155/82 mmHg. Respiratory normal breathing without difficulty. Psychiatric this patient is able to make decisions and demonstrates good insight into disease process. Alert and Oriented x 3. pleasant and cooperative. General Notes: Upon inspection patient's wound bed actually showed signs of good granulation epithelization no sharp debridement was necessary and I did actually proceed with reapplication of the NuShield today. She tolerated that without complication this was secured with a Mepitel and Steri-Strips. Integumentary (Hair, Skin) Struble, [name] ([id number]) Wound #7 status is Open. Original cause of wound was Gradually Appeared. The date acquired was: 05/12/2021. The wound has been in treatment 26 weeks. The wound is located on the Right,Lateral Malleolus. The wound measures 0.9cm length x 1cm width x 0.2cm depth; 0.707cm^2 area and 0.141cm^3 volume. There is Fat Layer (Subcutaneous Tissue) exposed. There is no tunneling or undermining  noted. There is a medium amount of serosanguineous drainage noted. There is large (67-100%) granulation within the wound bed. There is no necrotic tissue within the wound bed. Assessment Active Problems ICD-10 Pressure ulcer of right ankle, stage 3 Lymphedema, not elsewhere classified Venous insufficiency (chronic) (peripheral) Contusion of left lower leg, initial encounter Presence of cardiac pacemaker Long term (current) use of anticoagulants Procedures Wound #7 Pre-procedure diagnosis of Wound #7 is a Pressure Ulcer located on the Right,Lateral Malleolus. A skin graft procedure using a bioengineered skin substitute/cellular or tissue based product was performed by Tommie Sams., PA-C with the following instrument(s): Forceps and Scissors. Nushield was applied and secured with Steri-Strips. 1.6 sq cm of product was utilized and 0 sq cm was wasted. Post Application, adaptic was applied. A Time Out was conducted at 10:57, prior to the start of the procedure. The procedure was tolerated well with a pain level of 0 throughout and a pain level of 0 following the procedure. Post procedure Diagnosis Wound #7: Same as Pre-Procedure . Pre-procedure diagnosis of Wound #7 is a Pressure Ulcer located on the Right,Lateral Malleolus . There was a Three Layer Compression Therapy Procedure by Carlene Coria, RN. Post procedure Diagnosis Wound #7: Same as Pre-Procedure Plan Follow-up Appointments: Return Appointment in 1 week. Bathing/ Shower/ Hygiene: May shower with wound dressing protected with water repellent cover or cast protector. Cellular or Tissue Based Products: Cellular or Tissue Based Product Type: - NuShield #6 Cellular or Tissue Based Product applied to wound bed; including contact layer, fixation with steri-strips, dry gauze and cover dressing. (DO NOT REMOVE). Edema Control - Lymphedema / Segmental Compressive Device / Other: Optional: One layer of unna paste to top of compression  wrap (to act as an anchor). - and at toes Tubigrip single layer applied. - size D to left lower leg to above the knee Elevate, Exercise Daily and Avoid Standing for Long Periods of Time. Elevate legs to the level of the heart  and pump ankles as often as possible Elevate leg(s) parallel to the floor when sitting. WOUND #7: - Malleolus Wound Laterality: Right, Lateral Cleanser: Soap and Water 1 x Per Week/30 Days Discharge Instructions: Gently cleanse wound with antibacterial soap, rinse and pat dry prior to dressing wounds Secondary Dressing: Gauze 1 x Per Week/30 Days Discharge Instructions: As directed: dry, moistened with saline or moistened with Dakins Solution Compression Wrap: Profore Lite LF 3 Multilayer Compression Bandaging System 1 x Per Week/30 Days Discharge Instructions: Apply 3 multi-layer wrap as prescribed. [name], [name] ([id number]) 1. Would recommend currently that we continue with the NuShield treatment for the time being as it seems to be helping with the right ankle. 2. I am going to suggest as well there is nothing open on the left leg but we are going to double up on the Tubigrip to try to keep the edema under better control. We will see patient back for reevaluation in 1 week here in the clinic. If anything worsens or changes patient will contact our office for additional recommendations. Electronic Signature(s) Signed: 11/17/2021 6:29:42 PM By: Worthy Keeler PA-C Entered By: Worthy Keeler on 11/17/2021 18:29:42 Radovich, [name] ([id number]) -------------------------------------------------------------------------------- SuperBill Details Patient Name: [name] Date of Service: 11/17/2021 Medical Record Number: [id number] Patient Account Number: [id number] Date of Birth/Sex: Oct 10, 1923 (87 y.o. F) Treating RN: Carlene Coria Primary Care Provider: Adrian Prows Other Clinician: Referring Provider: Adrian Prows Treating  Provider/Extender: Skipper Cliche in Treatment: 26 Diagnosis Coding ICD-10 Codes Code Description [phone] Pressure ulcer of right ankle, stage 3 I89.0 Lymphedema, not elsewhere classified I87.2 Venous insufficiency (chronic) (peripheral) S80.12XA Contusion of left lower leg, initial encounter Z95.0 Presence of cardiac pacemaker Z79.01 Long term (current) use of anticoagulants Facility Procedures CPT4 Code: [id number] Description: F1219 NUSHIELD 1.6 SQ CM (CHG PER SQ CM) Modifier: Quantity: 2 CPT4 Code: [id number] Description: 98264 - SKIN SUB GRAFT TRNK/ARM/LEG Modifier: Quantity: 1 CPT4 Code: Description: ICD-10 Diagnosis Description 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, stage 3 Modifier: Quantity: Physician Procedures CPT4 Code: [id number] Description: 07680 - WC PHYS SKIN SUB GRAFT TRNK/ARM/LEG Modifier: Quantity: 1 CPT4 Code: Description: ICD-10 Diagnosis Description 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, stage 3 Modifier: Quantity: Electronic Signature(s) Signed: 11/17/2021 6:34:56 PM By: Worthy Keeler PA-C Previous Signature: 11/17/2021 11:31:49 AM Version By: Carlene Coria RN Entered By: Worthy Keeler on 11/17/2021 18:34:56

## 2021-11-17 NOTE — Progress Notes (Signed)
This patient returns to my office for at risk foot care.  This patient requires this care by a professional since this patient will be at risk due to having PVD and coagulation defect.  Patient is taking eliquis..  This patient is unable to cut nails herself since the patient cannot reach her nails.These nails are painful walking and wearing shoes. Patient presents to the office with her daughter. This patient presents for at risk foot care today.  General Appearance  Alert, conversant and in no acute stress.  Vascular  Dorsalis pedis and posterior tibial  pulses are weakly  palpable  bilaterally.  Capillary return is within normal limits  bilaterally. Cold feet.  Bilaterally. Absent hair noted.  Neurologic  Senn-Weinstein monofilament wire test within normal limits  bilaterally. Muscle power within normal limits bilaterally.  Nails Thick disfigured discolored nails with subungual debris  from hallux to fifth toes bilaterally. No evidence of bacterial infection or drainage bilaterally.  Orthopedic  No limitations of motion  feet .  No crepitus or effusions noted.  No bony pathology.  Medial deviation of 3,4 and 5 right foot.  No pain or swelling noted.  Skin  normotropic skin with no porokeratosis noted bilaterally.  No signs of infections or ulcers noted.     Onychomycosis  Pain in right toes  Pain in left toes  Consent was obtained for treatment procedures.   Mechanical debridement of nails 1-5  bilaterally performed with a nail nipper.  Filed with dremel without incident.    Return office visit    prn                 Told patient to return for periodic foot care and evaluation due to potential at risk complications.   Boneta Lucks DPM

## 2021-11-18 NOTE — Progress Notes (Signed)
[name] ([id number]) Visit Report for 11/17/2021 Arrival Information Details Patient Name: [name], [name] Date of Service: 11/17/2021 10:15 AM Medical Record Number: [id number] Patient Account Number: [id number] Date of Birth/Sex: [birth year] (87 y.o. F) Treating RN: Carlene Coria Primary Care Kalika Smay: Adrian Prows Other Clinician: Referring Tanyiah Laurich: Adrian Prows Treating Kalynne Womac/Extender: Skipper Cliche in Treatment: 39 Visit Information History Since Last Visit All ordered tests and consults were completed: No Patient Arrived: [name] Added or deleted any medications: No Arrival Time: 10:22 Any new allergies or adverse reactions: No Accompanied By: daughter Had a fall or experienced change in No Transfer Assistance: None activities of daily living that may affect Patient Identification Verified: Yes risk of falls: Secondary Verification Process Completed: Yes Signs or symptoms of abuse/neglect since last visito No Patient Requires Transmission-Based No Hospitalized since last visit: No Precautions: Implantable device outside of the clinic excluding No Patient Has Alerts: Yes cellular tissue based products placed in the center Patient Alerts: Patient on Blood since last visit: Thinner Has Dressing in Place as Prescribed: Yes NOT DIABETIC Pain Present Now: No ***ELIQUIS*** Electronic Signature(s) Signed: 11/18/2021 4:49:51 PM By: Carlene Coria RN Entered By: Carlene Coria on 11/17/2021 10:26:15 Rothe, [name] ([id number]) -------------------------------------------------------------------------------- Clinic Level of Care Assessment Details Patient Name: [name] Date of Service: 11/17/2021 10:15 AM Medical Record Number: [id number] Patient Account Number: [id number] Date of Birth/Sex: [birth year] (87 y.o. F) Treating RN: Carlene Coria Primary Care Loraine Freid: Adrian Prows Other Clinician: Referring Priya Matsen: Adrian Prows Treating Deionte Spivack/Extender: Skipper Cliche in Treatment: 26 Clinic Level of Care Assessment Items TOOL 1 Quantity Score []  - Use when EandM and Procedure is performed on INITIAL visit 0 ASSESSMENTS - Nursing Assessment / Reassessment []  - General Physical Exam (combine w/ comprehensive assessment (listed just below) when performed on new 0 pt. evals) []  - 0 Comprehensive Assessment (HX, ROS, Risk Assessments, Wounds Hx, etc.) ASSESSMENTS - Wound and Skin Assessment / Reassessment []  - Dermatologic / Skin Assessment (not related to wound area) 0 ASSESSMENTS - Ostomy and/or Continence Assessment and Care []  - Incontinence Assessment and Management 0 []  - 0 Ostomy Care Assessment and Management (repouching, etc.) PROCESS - Coordination of Care []  - Simple Patient / Family Education for ongoing care 0 []  - 0 Complex (extensive) Patient / Family Education for ongoing care []  - 0 Staff obtains Programmer, systems, Records, Test Results / Process Orders []  - 0 Staff telephones HHA, Nursing Homes / Clarify orders / etc []  - 0 Routine Transfer to another Facility (non-emergent condition) []  - 0 Routine Hospital Admission (non-emergent condition) []  - 0 New Admissions / Biomedical engineer / Ordering NPWT, Apligraf, etc. []  - 0 Emergency Hospital Admission (emergent condition) PROCESS - Special Needs []  - Pediatric / Minor Patient Management 0 []  - 0 Isolation Patient Management []  - 0 Hearing / Language / Visual special needs []  - 0 Assessment of Community assistance (transportation, D/C planning, etc.) []  - 0 Additional assistance / Altered mentation []  - 0 Support Surface(s) Assessment (bed, cushion, seat, etc.) INTERVENTIONS - Miscellaneous []  - External ear exam 0 []  - 0 Patient Transfer (multiple staff / Civil Service fast streamer / Similar devices) []  - 0 Simple Staple / Suture removal (25 or less) []  - 0 Complex Staple / Suture removal (26 or more) []  - 0 Hypo/Hyperglycemic  Management (do not check if billed separately) []  - 0 Ankle / Brachial Index (ABI) - do not check if billed separately Has the patient been seen at the hospital within  the last three years: Yes Total Score: 0 Level Of Care: ____ [name] ([id number]) Electronic Signature(s) Signed: 11/18/2021 4:49:51 PM By: Carlene Coria RN Entered By: Carlene Coria on 11/17/2021 11:02:13 Casebeer, [name] ([id number]) -------------------------------------------------------------------------------- Compression Therapy Details Patient Name: [name] Date of Service: 11/17/2021 10:15 AM Medical Record Number: [id number] Patient Account Number: [id number] Date of Birth/Sex: [birth year] (87 y.o. F) Treating RN: Carlene Coria Primary Care Lisa-Marie Rueger: Adrian Prows Other Clinician: Referring Talisha Erby: Adrian Prows Treating Kalil Woessner/Extender: Skipper Cliche in Treatment: 26 Compression Therapy Performed for Wound Assessment: Wound #7 Right,Lateral Malleolus Performed By: Jake Church, RN Compression Type: Three Layer Post Procedure Diagnosis Same as Pre-procedure Electronic Signature(s) Signed: 11/17/2021 11:32:34 AM By: Carlene Coria RN Entered By: Carlene Coria on 11/17/2021 11:32:34 Creson, [name] ([id number]) -------------------------------------------------------------------------------- Encounter Discharge Information Details Patient Name: [name] Date of Service: 11/17/2021 10:15 AM Medical Record Number: [id number] Patient Account Number: [id number] Date of Birth/Sex: 12/01/1923 (87 y.o. F) Treating RN: Carlene Coria Primary Care Carly Applegate: Adrian Prows Other Clinician: Referring Drayson Dorko: Adrian Prows Treating Stonewall Doss/Extender: Skipper Cliche in Treatment: 26 Encounter Discharge Information Items Post Procedure Vitals Discharge Condition: Stable Temperature (F): 98.2 Ambulatory Status: Ambulatory Pulse (bpm): 77 Discharge  Destination: Home Respiratory Rate (breaths/min): 18 Transportation: Private Auto Blood Pressure (mmHg): 155/82 Accompanied By: self Schedule Follow-up Appointment: Yes Clinical Summary of Care: Patient Declined Electronic Signature(s) Signed: 11/18/2021 4:49:51 PM By: Carlene Coria RN Entered By: Carlene Coria on 11/17/2021 11:17:08 Grudzinski, [name] ([id number]) -------------------------------------------------------------------------------- Lower Extremity Assessment Details Patient Name: [name] Date of Service: 11/17/2021 10:15 AM Medical Record Number: [id number] Patient Account Number: [id number] Date of Birth/Sex: 1923-11-01 (87 y.o. F) Treating RN: Carlene Coria Primary Care Soundra Lampley: Adrian Prows Other Clinician: Referring Randen Kauth: Adrian Prows Treating Sharmane Dame/Extender: Skipper Cliche in Treatment: 26 Edema Assessment Assessed: [Left: No] [Right: No] Edema: [Left: Ye] [Right: s] Calf Left: Right: Point of Measurement: 33 cm From Medial Instep 26 cm Ankle Left: Right: Point of Measurement: 10 cm From Medial Instep 17 cm Vascular Assessment Pulses: Dorsalis Pedis Palpable: [Right:Yes] Electronic Signature(s) Signed: 11/18/2021 4:49:51 PM By: Carlene Coria RN Entered By: Carlene Coria on 11/17/2021 10:41:37 Mckamey, [name] ([id number]) -------------------------------------------------------------------------------- Multi Wound Chart Details Patient Name: [name] Date of Service: 11/17/2021 10:15 AM Medical Record Number: [id number] Patient Account Number: [id number] Date of Birth/Sex: [birth year] (87 y.o. F) Treating RN: Carlene Coria Primary Care Arraya Buck: Adrian Prows Other Clinician: Referring Brainard Highfill: Adrian Prows Treating Ryleah Miramontes/Extender: Skipper Cliche in Treatment: 26 Vital Signs Height(in): 50 Pulse(bpm): 75 Weight(lbs): 150 Blood Pressure(mmHg): 155/82 Body Mass Index(BMI): 27 Temperature(F):  98.2 Respiratory Rate(breaths/min): 18 Photos: [N/A:N/A] Wound Location: Right, Lateral Malleolus N/A N/A Wounding Event: Gradually Appeared N/A N/A Primary Etiology: Pressure Ulcer N/A N/A Comorbid History: Cataracts, Congestive Heart Failure, N/A N/A Hypertension, Peripheral Arterial Disease, Osteoarthritis, Neuropathy Date Acquired: 05/12/2021 N/A N/A Weeks of Treatment: 26 N/A N/A Wound Status: Open N/A N/A Measurements L x W x D (cm) 0.9x1x0.2 N/A N/A Area (cm) : 0.707 N/A N/A Volume (cm) : 0.141 N/A N/A % Reduction in Area: 37.00% N/A N/A % Reduction in Volume: 37.30% N/A N/A Classification: Category/Stage III N/A N/A Exudate Amount: Medium N/A N/A Exudate Type: Serosanguineous N/A N/A Exudate Color: red, brown N/A N/A Granulation Amount: Large (67-100%) N/A N/A Necrotic Amount: None Present (0%) N/A N/A Exposed Structures: Fat Layer (Subcutaneous Tissue): N/A N/A Yes Fascia: No Tendon: No Muscle: No Joint: No Bone: No Epithelialization: None N/A N/A Treatment Notes  Electronic Signature(s) Signed: 11/18/2021 4:49:51 PM By: Carlene Coria RN Entered By: Carlene Coria on 11/17/2021 10:56:40 Coatney, [name] ([id number]) -------------------------------------------------------------------------------- [place] Details Patient Name: [name] Date of Service: 11/17/2021 10:15 AM Medical Record Number: [id number] Patient Account Number: [id number] Date of Birth/Sex: 15-Jun-1923 (87 y.o. F) Treating RN: Carlene Coria Primary Care Gwen Sarvis: Adrian Prows Other Clinician: Referring Rahn Lacuesta: Adrian Prows Treating Makinzee Durley/Extender: Skipper Cliche in Treatment: 26 Active Inactive Electronic Signature(s) Signed: 11/18/2021 4:49:51 PM By: Carlene Coria RN Entered By: Carlene Coria on 11/17/2021 10:56:22 Stief, [name] ([id number]) -------------------------------------------------------------------------------- Pain Assessment  Details Patient Name: [name] Date of Service: 11/17/2021 10:15 AM Medical Record Number: [id number] Patient Account Number: [id number] Date of Birth/Sex: 1922/12/14 (87 y.o. F) Treating RN: Carlene Coria Primary Care Anadelia Kintz: Adrian Prows Other Clinician: Referring Maciel Kegg: Adrian Prows Treating Kieffer Blatz/Extender: Skipper Cliche in Treatment: 26 Active Problems Location of Pain Severity and Description of Pain Patient Has Paino No Site Locations Pain Management and Medication Current Pain Management: Electronic Signature(s) Signed: 11/18/2021 4:49:51 PM By: Carlene Coria RN Entered By: Carlene Coria on 11/17/2021 10:26:54 [name] ([id number]) -------------------------------------------------------------------------------- Patient/Caregiver Education Details Patient Name: [name] Date of Service: 11/17/2021 10:15 AM Medical Record Number: [id number] Patient Account Number: [id number] Date of Birth/Gender: 09/27/1923 (87 y.o. F) Treating RN: Carlene Coria Primary Care Physician: Adrian Prows Other Clinician: Referring Physician: Adrian Prows Treating Physician/Extender: Skipper Cliche in Treatment: 26 Education Assessment Education Provided To: Patient Education Topics Provided Wound/Skin Impairment: Methods: Explain/Verbal Responses: State content correctly Electronic Signature(s) Signed: 11/18/2021 4:49:51 PM By: Carlene Coria RN Entered By: Carlene Coria on 11/17/2021 11:02:54 Strehle, [name] ([id number]) -------------------------------------------------------------------------------- Wound Assessment Details Patient Name: [name] Date of Service: 11/17/2021 10:15 AM Medical Record Number: [id number] Patient Account Number: [id number] Date of Birth/Sex: 1923/09/28 (87 y.o. F) Treating RN: Carlene Coria Primary Care Sherica Paternostro: Adrian Prows Other Clinician: Referring Mendy Lapinsky: Adrian Prows Treating Siah Steely/Extender: Skipper Cliche in Treatment: 26 Wound Status Wound Number: 7 Primary Pressure Ulcer Etiology: Wound Location: Right, Lateral Malleolus Wound Open Wounding Event: Gradually Appeared Status: Date Acquired: 05/12/2021 Comorbid Cataracts, Congestive Heart Failure, Hypertension, Weeks Of Treatment: 26 History: Peripheral Arterial Disease, Osteoarthritis, Neuropathy Clustered Wound: No Photos Wound Measurements Length: (cm) 0.9 Width: (cm) 1 Depth: (cm) 0.2 Area: (cm) 0.707 Volume: (cm) 0.141 % Reduction in Area: 37% % Reduction in Volume: 37.3% Epithelialization: None Tunneling: No Undermining: No Wound Description Classification: Category/Stage III Exudate Amount: Medium Exudate Type: Serosanguineous Exudate Color: red, brown Foul Odor After Cleansing: No Slough/Fibrino No Wound Bed Granulation Amount: Large (67-100%) Exposed Structure Necrotic Amount: None Present (0%) Fascia Exposed: No Fat Layer (Subcutaneous Tissue) Exposed: Yes Tendon Exposed: No Muscle Exposed: No Joint Exposed: No Bone Exposed: No Treatment Notes Wound #7 (Malleolus) Wound Laterality: Right, Lateral Cleanser Soap and Water Discharge Instruction: Gently cleanse wound with antibacterial soap, rinse and pat dry prior to dressing wounds Peri-Wound Care Fatima, [name] ([id number]) Topical Primary Dressing Secondary Dressing Gauze Discharge Instruction: As directed: dry, moistened with saline or moistened with Dakins Solution Secured With Compression Wrap Profore Lite LF 3 Multilayer Compression Bandaging System Discharge Instruction: Apply 3 multi-layer wrap as prescribed. Compression Stockings Add-Ons Electronic Signature(s) Signed: 11/18/2021 4:49:51 PM By: Carlene Coria RN Entered By: Carlene Coria on 11/17/2021 10:40:24 [place]ford, [name] ([id number]) -------------------------------------------------------------------------------- Vitals  Details Patient Name: [name] Date of Service: 11/17/2021 10:15 AM Medical Record Number: [id number] Patient Account Number: [id number] Date of Birth/Sex: 07/20/1923 (87  y.o. F) Treating RN: Carlene Coria Primary Care Laiyla Slagel: Adrian Prows Other Clinician: Referring Kateleen Encarnacion: Adrian Prows Treating Merisa Julio/Extender: Skipper Cliche in Treatment: 26 Vital Signs Time Taken: 10:26 Temperature (F): 98.2 Height (in): 62 Pulse (bpm): 77 Weight (lbs): 150 Respiratory Rate (breaths/min): 18 Body Mass Index (BMI): 27.4 Blood Pressure (mmHg): 155/82 Reference Range: 80 - 120 mg / dl Electronic Signature(s) Signed: 11/18/2021 4:49:51 PM By: Carlene Coria RN Entered By: Carlene Coria on 11/17/2021 10:26:40

## 2021-11-24 ENCOUNTER — Other Ambulatory Visit: Payer: Self-pay

## 2021-11-24 ENCOUNTER — Encounter: Payer: Medicare Other | Admitting: Physician Assistant

## 2021-11-24 DIAGNOSIS — L89513 Pressure ulcer of right ankle, stage 3: Secondary | ICD-10-CM | POA: Diagnosis not present

## 2021-11-24 NOTE — Progress Notes (Addendum)
MARQUETA, PULLEY ([id number]) Visit Report for 11/24/2021 Chief Complaint Document Details Patient Name: [name], [name]. Date of Service: 11/24/2021 10:15 AM Medical Record Number: [id number] Patient Account Number: [id number] Date of Birth/Sex: 12-26-1922 (87 y.o. F) Treating RN: Carlene Coria Primary Care Provider: Adrian Prows Other Clinician: Referring Provider: Adrian Prows Treating Provider/Extender: Skipper Cliche in Treatment: 27 Information Obtained from: Patient Chief Complaint Right foot ulcer Electronic Signature(s) Signed: 11/24/2021 10:26:40 AM By: Worthy Keeler PA-C Entered By: Worthy Keeler on 11/24/2021 10:26:39 [name], [name] ([id number]) -------------------------------------------------------------------------------- Cellular or Tissue Based Product Details Patient Name: [name], [name] Date of Service: 11/24/2021 10:15 AM Medical Record Number: [id number] Patient Account Number: [id number] Date of Birth/Sex: [birth year] (87 y.o. F) Treating RN: Carlene Coria Primary Care Provider: Adrian Prows Other Clinician: Referring Provider: Adrian Prows Treating Provider/Extender: Skipper Cliche in Treatment: 27 Cellular or Tissue Based Product Type Wound #7 Right,Lateral Malleolus Applied to: Performed By: Physician Tommie Sams., PA-C Cellular or Tissue Based Product [place] Type: Level of Consciousness (Pre- Awake and Alert procedure): Pre-procedure Verification/Time Out Yes - 10:34 Taken: Location: trunk / arms / legs Wound Size (sq cm): 0.42 Product Size (sq cm): 1.6 Waste Size (sq cm): 0 Amount of Product Applied (sq cm): 1.6 Instrument Used: Forceps, Scissors Lot #: [id number] Order #: 7 Expiration Date: 06/20/2026 Fenestrated: No Reconstituted: Yes Solution Type: normal saline Solution Amount: 10 ml Lot #: [id number] Solution Expiration Date: 05/05/2023 Secured: Yes Secured With: Steri-Strips Dressing Applied:  Yes Primary Dressing: adaptic Procedural Pain: 0 Post Procedural Pain: 0 Response to Treatment: Procedure was tolerated well Level of Consciousness (Post- Awake and Alert procedure): Post Procedure Diagnosis Same as Pre-procedure Electronic Signature(s) Signed: 11/24/2021 11:33:18 AM By: Carlene Coria RN Entered By: Carlene Coria on 11/24/2021 10:35:34 [name], [name] ([id number]) -------------------------------------------------------------------------------- HPI Details Patient Name: [name] Date of Service: 11/24/2021 10:15 AM Medical Record Number: [id number] Patient Account Number: [id number] Date of Birth/Sex: 1923-03-30 (87 y.o. 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 Dr. Amalia Hailey the podiatrist saw her on 07/20/2017 and sent her to the wound center for possible hyperbaric oxygen therapy. past medical history of peripheral vascular disease, varicose veins, status post appendectomy, basal cell carcinoma excision from the left leg, cholecystectomy, pacemaker placement, right lower extremity angiography done by Dr. dew in March 2017 with placement of a stent. there is also note of a successful ablation of the right small saphenous vein done which was reviewed by ultrasound on 10/24/2016. the patient had a right small saphenous vein ablation done on 10/20/2016. The patient has never been a smoker. She has been seen by Dr. Corene Cornea dew the vascular surgeon who most recently saw her on 06/15/2017 for evaluation of ongoing problems with right leg swelling. She had a lower extremity arterial duplex  examination done(02/13/17) which showed patent distal right superficial femoral artery stent and above-the-knee popliteal stent without evidence of restenosis. The ABI was more than 1.3 on the right and more than 1.3 on the left. This was consistent with noncompressible arteries due to medial calcification. The right great toe pressure and PPG waveforms are within normal limits and the left great toe pressure and PPG waveforms are  decreased. he recommended she continue to wear her compression stockings and continue with elevation. She is scheduled to have a noninvasive arterial study in the near future 08/16/2017 -- had a lower extremity arterial duplex examination done which showed patent distal right superficial femoral artery stent and above-the-knee popliteal stent without evidence of restenosis. The ABI was more than 1.3 on the right and more than 1.3 on the left. This was consistent with noncompressible arteries due to medial calcification. The right great toe pressure and PPG waveforms are within normal limits and the left great toe pressure and PPG waveforms are decreased. the x-ray of the right ankle has not yet been done 08/24/2017 -- had a right ankle x-ray -- IMPRESSION:1. No fracture, bone lesion or evidence of osteomyelitis. 2. Lateral soft tissue swelling with a soft tissue ulcer. she has not yet seen the vascular surgeon for review 08/31/17 on evaluation today patient's wound appears to be showing signs of improvement. She still with her appointment with vascular in order to review her results of her vascular study and then determine if any intervention would be recommended at that time. No fevers, chills, nausea, or vomiting noted at this time. She has been tolerating the dressing changes without complication. 09/28/17 on evaluation today patient's wound appears to show signs of good improvement in regard to the granulation tissue which is surfacing. There is still a layer of slough  covering the wound and the posterior portion is still significantly deeper than the anterior nonetheless there has been some good sign of things moving towards the better. She is going to go back to Dr. dew for reevaluation to ensure her blood flow is still appropriate. That will be before her next evaluation with [place] next week. No fevers, chills, nausea, or vomiting noted at this time. Patient does have some discomfort rated to be a 3-4/10 depending on activity specifically cleansing the wound makes it worse. 10/05/2017 -- the patient was seen by Dr. Lucky Cowboy last week and noninvasive studies showed a normal right ABI with brisk triphasic waveforms consistent with no arterial insufficiency including normal digital pressures. The duplex showed a patent distal right SFA stent and the proximal SFA was also normal. He was pleased with her test and thought she should have enough of perfusion for normal wound healing. He would see her back in 6 months time. 12/21/17 on evaluation today patient appears to be doing fairly well in regard to her right lateral ankle wound. Unfortunately the main issue that she is expansion at this point is that she is having some issues with what appears to be some cellulitis in the right anterior shin. She has also been noting a little bit of uncomfortable feeling especially last night and her ankle area. I'm afraid that she made the developing a little bit of an infection. With that being said I think it is in the early stages. 12/28/17 on evaluation today patient's ankle appears to be doing excellent. She's making good progress at this point the cellulitis seems to have improved after last week's evaluation. Overall she is having no significant discomfort which is excellent news. She does have an appointment with Dr. dew on March 29, 2018 for reevaluation in regard to the stent he placed. She seems to have excellent blood flow in the right lower extremity. 01/19/12 on evaluation  today patient's wound appears to be doing very well. In fact she does not appear to require debridement at this point, there's no evidence of infection, and overall  from the standpoint of the wound she seems to be doing very well. With that being said I believe that it may be time to switch to different dressing away from the [facility] Dressing she tells me she does have a lot going on her friend actually passed away yesterday and she's also having a lot of issues with her husband this obviously is weighing heavy on her as far as your thoughts and concerns today. 01/25/18 on evaluation today patient appears to be doing fairly well in regard to her right lateral malleolus. She has been tolerating the dressing changes without complication. Overall I feel like this is definitely showing signs of improvement as far as how the overall appearance of the wound is there's also evidence of epithelium start to migrate over the granulation tissue. In general I think that she is progressing nicely as far as the wound is concerned. The only concern she really has is whether or not we can switch to every other week visits in order to avoid having as many appointments as her daughters have a difficult time getting her to her appointments as well as the patient's husband to his he is not doing very well at this point. 02/22/18 on evaluation today patient's right lateral malleolus ulcer appears to be doing great. She has been tolerating the dressing changes without complication. Overall you making excellent progress at this time. Patient is having no significant discomfort. BAYLEIGH, LOFLIN ([id number]) 03/15/18 on evaluation today patient appears to be doing much more poorly in regard to her right lateral ankle ulcer at this point. Unfortunately since have last seen her her husband has passed just a few days ago is obviously weighed heavily on her her daughter also had surgery well she is with her today as  usual. There does not appear to be any evidence of infection she does seem to have significant contusion/deep tissue injury to the right lateral malleolus which was not noted previous when I saw her last. It's hard to tell of exactly when this injury occurred although during the time she was spending the night in the hospital this may have been most likely. 03/22/18 on evaluation today patient appears to actually be doing very well in regard to her ulcer. She did unfortunately have a setback which was noted last week however the good news is we seem to be getting back on track and in fact the wound in the core did still have some necrotic tissue which will be addressed at this point today but in general I'm seeing signs that things are on the up and up. She is glad to hear this obviously she's been somewhat concerned that due to the how her wound digressed more recently. 03/29/18 on evaluation today patient appears to be doing fairly well in regard to her right lower extremity lateral malleolus ulcer. She unfortunately does have a new area of pressure injury over the inferior portion where the wound has opened up a little bit larger secondary to the pressure she seems to be getting. She does tell me sometimes when she sleeps at night that it actually hurts and does seem to be pushing on the area little bit more unfortunately. There does not appear to be any evidence of infection which is good news. She has been tolerating the dressing changes without complication. She also did have some bruising in the left second and third toes due to the fact that she may have bump this or injured it although she  has neuropathy so she does not feel she did move recently that may have been where this came from. Nonetheless there does not appear to be any evidence of infection at this time. 04/12/18 on evaluation today patient's wound on the right lateral ankle actually appears to be doing a little bit better with a lot  of necrotic docking tissue centrally loosening up in clearing away. However she does have the beginnings of a deep tissue injury on the left lateral malleolus likely due to the fact we've been trying offload the right as much as we have. I think she may benefit from an assistive soft device to help with offloading and it looks like they're looking at one of the doughnut conditions that wraps around the lower leg to offload which I think will definitely do a good job. With that being said I think we definitely need to address this issue on the left before it becomes a wound. Patient is not having significant pain. 04/19/18 on evaluation today patient appears to be doing excellent in regard to the progress she's made with her right lateral ankle ulcer. The left ankle region which did show evidence of a deep tissue injury seems to be resolving there's little fluid noted underneath and a blister there's nothing open at this point in time overall I feel like this is progressing nicely which is good news. She does not seem to be having significant discomfort at this point which is also good news. 04/25/18-She is here in follow up evaluation for bilateral lateral malleolar ulcers. The right lateral malleolus ulcer with pale subcutaneous tissue exposure, central area of ulcer with tendon/periosteum exposed. The left lateral malleolus ulcer now with central area of nonviable tissue, otherwise deep tissue injury. She is wearing compression wraps to the left lower extremity, she will place the right lower extremity compression wraps on when she gets home. She will be out of town over the weekend and return next week and follow-up appointment. She completed her doxycycline this morning 05/03/18 on evaluation today patient appears to be doing very well in regard to her right lateral ankle ulcer in general. At least she's showing some signs of improvement in this regard. Unfortunately she has some additional injury  to the left lateral malleolus region which appears to be new likely even over the past several days. Again this determination is based on the overall appearance. With that being said the patient is obviously frustrated about this currently. 05/10/18-She is here in follow-up evaluation for bilateral lateral malleolar ulcers. She states she has purchased offloading shoes/boots and they will arrive tomorrow. She was asked to bring them in the office at next week's appointment so her provider is aware of product being utilized. She continues to sleep on right or left side, she has been encouraged to sleep on her back. The right lateral malleolus ulcer is precariously close to peri-osteum; will order xray. The left lateral malleolus ulcer is improved. Will switch back to santyl; she will follow up next week. 05/17/18 on evaluation today patient actually appears to be doing very well in regard to her malleolus her ulcers compared to last time I saw them. She does not seem to have as much in the way of contusion at this point which is great news. With that being said she does continue to have discomfort and I do believe that she is still continuing to benefit from the offloading/pressure reducing boots that were recommended. I think this is the key to trying  to get this to heal up completely. 05/24/18 on evaluation today patient actually appears to be doing worse at this point in time unfortunately compared to her last week's evaluation. She is having really no increased pain which is good news unfortunately she does have more maceration in your theme and noted surrounding the right lateral ankle the left lateral ankle is not really is erythematous I do not see signs of the overt cellulitis on that side. Unfortunately the wounds do not seem to have shown any signs of improvement since the last evaluation. She also has significant swelling especially on the right compared to previous some of this may be due to  infection however also think that she may be served better while she has these wounds by compression wrapping versus continuing to use the Juxta-Lite for the time being. Especially with the amount of drainage that she is experiencing at this point. No fevers, chills, nausea, or vomiting noted at this time. 05/31/18 on evaluation today patient appears to actually be doing better in regard to her right lateral lower extremity ulcer specifically on the malleolus region. She has been tolerating the antibiotic without complication. With that being said she still continues to have issues but a little bit of redness although nothing like she what she was experiencing previous. She still continues to pressure to her ankle area she did get the problem on offloading boots unfortunately she will not wear them she states there too uncomfortable and she can't get in and out of the bed. Nonetheless at this point her wounds seem to be continually getting worse which is not what we want I'm getting somewhat concerned about her progress and how things are going to proceed if we do not intervene in some way shape or form. I therefore had a very lengthy conversation today about offloading yet again and even made a specific suggestion for switching her to a memory foam mattress and even gave the information for a specific one that they could look at getting if it was something that they were interested in considering. She does not want to be considered for a hospital bed air mattress although honestly insurance would not cover it that she does not have any wounds on her trunk. 06/14/18 on evaluation today both wounds over the bilateral lateral malleolus her ulcers appear to be doing better there's no evidence of pressure injury at this point. She did get the foam mattress for her bed and this does seem to have been extremely beneficial for her in my pinion. Her daughter states that she is having difficulty getting out of  bed because of how soft it is. The patient also relates this to be. Nonetheless I do feel like she's actually doing better. Unfortunately right after and around the time she was getting the mattress she also sustained a fall when she got up to go pick up the phone and ended up injuring her right elbow she has 18 sutures in place. We are not caring for this currently although home health is going to be taking the sutures out shortly. Nonetheless this may be something that we need to evaluate going forward. It depends on how well it has or has not healed in the end. She also recently saw an orthopedic specialist for an injection in the right shoulder just before her fall unfortunately the fall seems to have worsened her pain. 06/21/18 on evaluation today patient appears to be doing about the same in regard to her lateral  malleolus ulcers. Both appear to be just a little bit deeper but again we are clinging away the necrotic and dead tissue which I think is why this is progressing towards a deeper realm as opposed [name], [name]. ([id number]) to improving from my measurement standpoint in that regard. Nonetheless she has been tolerating the dressing changes she absolutely hates the memory foam mattress topper that was obtained for her nonetheless I do believe this is still doing excellent as far as taking care of excess pressure in regard to the lateral malleolus regions. She in fact has no pressure injury that I see whereas in weeks past it was week by week I was constantly seeing new pressure injuries. Overall I think it has been very beneficial for her. 07/03/18; patient arrives in my clinic today. She has deep punched out areas over her bilateral lateral malleoli. The area on the right has some more depth. We spent a lot of time today talking about pressure relief for these areas. This started when her daughter asked for a prescription for a memory foam mattress. I have never written a prescription  for a mattress and I don't think insurances would pay for that on an ordinary bed. In any case he came up that she has foam boots that she refuses to wear. I would suggest going to these before any other offloading issues when she is in bed. They say she is meticulous about offloading this the rest of the day 07/10/18- She is seen in follow-up evaluation for bilateral, lateral malleolus ulcers. There is no improvement in the ulcers. She has purchased and is sleeping on a memory foam mattress/overlay, she has been using the offloading boots nightly over the past week. She has a follow up appointment with vascular medicine at the end of October, in my opinion this follow up should be expedited given her deterioration and suboptimal TBI results. We will order plain film xray of the left ankle as deeper structures are palpable; would consider having MRI, regardless of xray report(s). The ulcers will be treated with iodoflex/iodosorb, she is unable to safely change the dressings daily with santyl. 07/19/18 on evaluation today patient appears to be doing in general visually well in regard to her bilateral lateral malleolus ulcers. She has been tolerating the dressing changes without complication which is good news. With that being said we did have an x-ray performed on 07/12/18 which revealed a slight loosen see in the lateral portion of the distal left fibula which may represent artifact but underline lytic destruction or osteomyelitis could not be excluded. MRI was recommended. With that being said we can see about getting the patient scheduled for an MRI to further evaluate this area. In fact we have that scheduled currently for August 20 19,019. 07/26/18 on evaluation today patient's wound on the right lateral ankle actually appears to be doing fairly well at this point in my pinion. She has made some good progress currently. With that being said unfortunately in regard to the left lateral ankle ulcer this  seems to be a little bit more problematic at this time. In fact as I further evaluated the situation she actually had bone exposed which is the first time that's been the case in the bone appear to be necrotic. Currently I did review patient's note from Dr. Bunnie Domino office with [facility]. He stated that ABI was 1.26 on the right and 0.95 on the left with good waveforms. Her perfusion is stable not reduced  from previous studies and her digital waveforms were pretty good particularly on the right. His conclusion upon review of the note was that there was not much she could do to improve her perfusion and he felt she was adequate for wound healing. His suggestion was that she continued to see [place] and consider a synthetic skin graft if there was no underlying infection. He plans to see her back in six months or as needed. 08/01/18 on evaluation today patient appears to be doing better in regard to her right lateral ankle ulcer. Her left lateral ankle ulcer is about the same she still has bone involvement in evidence of necrosis. There does not appear to be evidence of infection at this time On the right lateral lower extremity. I have started her on the Augmentin she picked this up and started this yesterday. This is to get her through until she sees infectious disease which is scheduled for 08/12/18. 08/06/18 on evaluation today patient appears to be doing rather well considering my discussion with patient's daughter at the end of last week. The area which was marked where she had erythema seems to be improved and this is good news. With that being said overall the patient seems to be making good improvement when it comes to the overall appearance of the right lateral ankle ulcer although this has been slow she at least is coming around in this regard. Unfortunately in regard to the left lateral ankle ulcer this is osteomyelitis based on the pathology report as well is bone culture.  Nonetheless we are still waiting CT scan. Unfortunately the MRI we originally ordered cannot be performed as the patient is a pacemaker which I had overlooked. Nonetheless we are working on the CT scan approval and scheduling as of now. She did go to the hospital over the weekend and was placed on IV Cefzo for a couple of days. Fortunately this seems to have improved the erythema quite significantly which is good news. There does not appear to be any evidence of worsening infection at this time. She did have some bleeding after the last debridement therefore I did not perform any sharp debridement in regard to left lateral ankle at this point. Patient has been approved for a snap vac for the right lateral ankle. 08/14/18; the patient with wounds over her bilateral lateral malleoli. The area on the right actually looks quite good. Been using a snap back on this area. Healthy granulation and appears to be filling in. Unfortunately the area on the left is really problematic. 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 Also noted to have cellulitis. She saw Dr. Novella Olive of infectious disease today and was put on linezolid. We are able to verify this with her pharmacy. She is completed the Augmentin that she was already on. We've been using Iodoflex to this area 08/23/18 on evaluation today patient's wounds both actually appear to be doing better compared to my prior evaluations. Fortunately she showing signs of good improvement in regard to the overall wound status especially where were using the snap vac on the right. In regard to left lateral malleolus the wound bed actually appears to be much cleaner than previously noted. I do not feel any phone directly probed during evaluation today and though there is tendon noted this does not appear to be necrotic it's actually fairly good as far as the overall appearance of the  tendon is  concerned. In general the wound bed actually appears to be doing significantly better than it was previous. Patient is currently in the care of [name] and I did review that note today. He actually has her on two weeks of linezolid and then following the patient will be on 1-2 months of Keflex. That is the plan currently. She has been on antibiotics therapy as prescribed by myself initially starting on July 30, 2018 and has been on that continuously up to this point. 08/30/18 on evaluation today patient actually appears to be doing much better in regard to her right lateral malleolus ulcer. She has been tolerating the dressing changes specifically the snap vac without complication although she did have some issues with the seal currently. Apparently there was some trouble with getting it to maintain over the past week past Sunday. Nonetheless overall the wound appears better in regard to the right lateral malleolus region. In regard to left lateral malleolus this actually show some signs of additional granulation although there still tendon noted in the base of the wound this appears to be healthy not necrotic in any way whatsoever. We are considering potentially using a snap vac for the left lateral malleolus as well the product wrap from [employer], [place], was present in the clinic today we're going to see this patient I did have her come in with me after obtaining consent from the patient and her daughter in order to look at the wound and see if there's any recommendation one way or another as to whether or not they felt the snapback could be beneficial for the left lateral malleolus region. But the conclusion was that it might be but that this is definitely a little bit deeper wound than what traditionally would be utilized for a snap vac. 09/06/18 on evaluation today patient actually appears to be doing excellent in my pinion in regard to both ankle ulcers. She has been tolerating  the dressing changes without complication which is great news. Specifically we have been using the snap vac. In regard to the right ankle I'm not even sure that this is going to be necessary for today and following as the wound has filled in quite nicely. In regard to the left ankle I do believe [name], HESLIN ([id number]) that we're seeing excellent epithelialization from the edge as well as granulation in the central portion the tendon is still exposed but there's no evidence of necrotic bone and in general I feel like the patient has made excellent progress even compared to last week with just one week of the snap vac. 09/11/18; this is a patient who has wounds on her bilateral lateral malleoli. Initially both of these were deep stage IV wounds in the setting of chronic arterial insufficiency. She has been revascularized. As I understand think she been using snap vacs to both of these wounds however the area on the right became more superficial and currently she is only using it on the left. Using silver collagen on the right and silver collagen under the back on the left I believe 09/19/18 on evaluation today patient actually appears to be doing very well in regard to her lateral malleolus or ulcers bilaterally. She has been tolerating the dressing changes without complication. Fortunately there does not appear to be any evidence of infection at this time. Overall I feel like she is improving in an excellent manner and I'm very pleased with the fact that everything seems to be turning towards the better for her.  This has obviously been a long road. 09/27/18 on evaluation today patient actually appears to be doing very well in regard to her bilateral lateral malleolus ulcers. She has been tolerating the dressing changes without complication. Fortunately there does not appear to be any evidence of infection at this time which is also great news. No fevers, chills, nausea, or vomiting noted at  this time. Overall I feel like she is doing excellent with the snap vac on the left malleolus. She had 40 mL of fluid collection over the past week. 10/04/18 on evaluation today patient actually appears to be doing well in regard to her bilateral lateral malleolus ulcers. She continues to tolerate the dressing changes without complication. One issue that I see is the snap vac on the left lateral malleolus which appears to have sealed off some fluid underlying this area and has not really allowed it to heal to the degree that I would like to see. For that reason I did suggest at this point we may want to pack a small piece of packing strip into this region to allow it to more effectively wick out fluid. 10/11/18 in general the patient today does not feel that she has been doing very well. She's been a little bit lethargic and subsequently is having bodyaches as well according to what she tells me today. With that being said overall she has been concerned with the fact that something may be worsening although to be honest her wounds really have not been appearing poorly. She does have a new ulcer on her left heel unfortunately. This may be pressure related. Nonetheless it seems to me to have potentially started at least as a blister I do not see any evidence of deep tissue injury. In regard to the left ankle the snap vac still seems to be causing the ceiling off of the deeper part of the wound which is in turn trapping fluid. I'm not extremely pleased with the overall appearance as far as progress from last week to this week therefore I'm gonna discontinue the snap vac at this point. 10/18/18 patient unfortunately this point has not been feeling well for the past several days. She was seen by Grayland Ormond her primary care provider who is a Librarian, academic at [employer]. Subsequently she states that she's been very weak and generally feeling malaise. No fevers, chills, nausea, or vomiting noted  at this time. With that being said bloodwork was performed at the PCP office on the 11th of this month which showed a white blood cell count of 10.7. This was repeated today and shows a white blood cell count of 12.4. This does show signs of worsening. Coupled with the fact that she is feeling worse and that her left ankle wound is not really showing signs of improvement I feel like this is an indication that the osteomyelitis is likely exacerbating not improving. Overall I think we may also want to check her C-reactive protein and sedimentation rate. Actually did call [facility] office this afternoon while the patient was in the office here with me. Subsequently based on the findings we discussed treatment possibilities and I think that it is appropriate for [place] to go ahead and initiate treatment with doxycycline which I'm going to do. Subsequently he did agree to see about adding a CRP and sedimentation rate to her orders. If that has not already been drawn to where they can run it they will contact the patient she can come back to have  that check. They are in agreement with plan as far as the patient and her daughter are concerned. Nonetheless also think we need to get in touch with Dr. Henreitta Leber office to see about getting the patient scheduled with him as soon as possible. 11/08/18 on evaluation today patient presents for follow-up concerning her bilateral foot and ankle ulcers. I did do an extensive review of her chart in epic today. Subsequently she was seen by [name] he did initiate Cefepime IV antibiotic therapy. Subsequently she had some issues with her PICC line this had to be removed because it was coiled and then replaced. Fortunately that was now settled. Unfortunately she has continued have issues with her left heel as well as the issues that she is experiencing with her bilateral lateral malleolus regions. I do believe however both areas seem to be doing a little bit better on  evaluation today which is good news. No fevers, chills, nausea, or vomiting noted at this time. She actually has an angiogram schedule with Dr. dew on this coming Monday, November 11, 2018. Subsequently the patient states that she is feeling much better especially than what she was roughly 2 weeks ago. She actually had to cancel an appointment because she was feeling so poorly. No fevers, chills, nausea, or vomiting noted at this time. 11/15/18 on evaluation today patient actually is status post having had her angiogram with Dr. dew Monday, four days ago. 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 He had to go and work on several areas of the vasculature fortunately he was able to obtain no more than a 30% residual stenosis throughout post procedure. I reviewed this note today. I think this will definitely help with healing at this time. Fortunately there does not appear to be any signs of infection and I do feel like ratio already has a better appearance to it. 11/22/18 upon evaluation today patient actually appears to be doing very well in regard to her wounds in general. The right lateral malleolus looks excellent the heel looks better in the left lateral malleolus also appears to be doing a little better. With that being said the right second toe actually appears to be open and training we been watching this is been dry and stable but now is open. 12/03/2018 Seen today for follow-up and management of multiple bilateral lower extremity wounds. New pressure injury of the great toe which is closed at this time. Wound of the right distal second toe appears larger today with deep undermining and a pocket of fluid present within the undermining region. Left and right malleolus is wounds are stable today with no signs and symptoms of infection.Denies any needs or concerns during exam today. 12/13/18 on evaluation today patient appears to be doing somewhat better in regard to her  left heel ulcer. She also seems to be completely healed in regard to the right lateral malleolus ulcer. The left malleolus ulcer is smaller what unfortunately the wounds which are new over the first and second toes of the right foot are what are most concerning at this point especially the second. Both areas did require sharp debridement today. 12/20/18 on evaluation today patient's wound actually appears to be doing better in regard to left lateral ankle and her right lateral ankle continues to remain healed. The hill ulcer on the left is improved. She does have improvement noted as well in regard to both toe ulcers. Overall I'm very pleased in this regard.  No fevers, chills, nausea, or vomiting noted at this time. 12/23/18 on evaluation today patient is seen after she had her toenails trimmed at the podiatrist office due to issues with her right great toe. There was what appeared to be dark eschar on the surface of the wound which had her in the podiatrist concerned. Nonetheless as I remember that during the last office visit I had utilize silver nitrate of this area I was much less concerned about the situation. Subsequently I was able to clean off much of this tissue without any complication today. This does not appear to show any signs of infection and actually look somewhat better [name], [name] ([id number]) compared to last time post debridement. Her second toe on the right foot actually had callous over and there did appear still be some fluid underneath this that would require debridement today. 12/27/18 on evaluation today patient actually appears to be showing signs of improvement at all locations. Even the left lateral ankle although this is not quite as great as the other sites. Fortunately there does not appear to be any signs of infection at this time and both of her toes on the right foot seem to be showing signs of improvement which is good news and very pleased in this  regard. 01/03/19 on evaluation today patient appears to be doing better for the most part in regard to her wounds in particular. There does not appear to be any evidence of infection at this time which is good news. Fortunately there is no sign of really worsening anywhere except for the right great toe which she does have what appears to be a bruise/deep tissue injury which is very superficial and already resolving. I'm not sure where this came from I questioned her extensively and she does not recall what may have happened with this. Other than that the patient seems to be doing well even the left lateral ankle ulcer looks good and is getting smaller. 01/10/19 on evaluation today patient appears to be doing well in regard to her left heel wound and both of her toe wounds. Overall I feel like there is definitely improvement here and I'm happy in that regard. With that being said unfortunately she is having issues with the left lateral malleolus ulcer which unfortunately still has a lot of depth to it. This is gonna be a very difficult wound for [place] to be able to truly get to heal. I may want to consider some type of skin substitute to see if this would be of benefit for her. I'll discuss this with her more the next visit most likely. This was something I thought about more at the end of the visit when I was Artie out of the room and the patient had been discharged. 01/17/19 on evaluation today patient appears to be doing very well in regard to her wounds in general. She's been making excellent progress at this time. Fortunately there's no sign of infection at this time either. No fevers, chills, nausea, or vomiting noted at this time. The biggest issue is still her left lateral malleolus where it appears to be doing well and is getting smaller but still shows a small corner where this is deeper and goes down into what appears to be the joint space. Nonetheless this is taking much longer to heal although it  still looks better in smaller than previous evaluations. 01/24/19 on evaluation today patient's wounds actually appear to be doing rather well in general overall. She did  require some sharp debridement in regard to the right great toe but everything else appears to be doing excellent no debridement was even necessary. No fevers, chills, nausea, or vomiting noted at this time. 01/31/19 on evaluation today patient actually appears to be doing much better in regard to her left foot wound on the heel as well as the ankle. The right great toe appears to be a little bit worse today this had callous over and trapped a lot of fluid underneath. Fortunately there's no signs of infection at any site which is great news. 02/07/19 on evaluation today patient actually appears to be doing decently well in regard to all of her ulcers at this point. No sharp debridement was required she is a little bit of hyper granulation in regard to the left lateral ankle as well as the left heel but the hill itself is almost completely healed which is excellent news. Overall been very pleased in this regard. 02/14/19 on evaluation today patient actually appears to be doing very well in regard to her ulcers on the right first toe, left lateral malleolus, and left heel. In fact the heel is almost completely healed at this point. The patient does not show any signs of infection which is good news. Overall very pleased with how things have progressed. 04/18/19 Telehealth Evaluation During the COVID-19 National Emergency: Verbal Consent: Obtained from patient Allergies: reviewed and the active list is current. Medication changes: patient has no current medication changes. COVID-19 Screening: 1. Have you traveled internationally or on a cruise ship in the last 14 dayso No 2. Have you had contact with someone with or under investigation for COVID-19o No 3. Have you had a fever, cough, sore throat, or experiencing shortness of breatho  No on evaluation today actually did have a visit with this patient through a telehealth encounter with her home health nurse. Subsequently it was noted that the patient actually appears to be doing okay in regard to her wounds both the right great toe as well as the left lateral malleolus have shown signs of improvement although this in your theme around the left lateral malleolus there eschar coverings for both locations. The question is whether or not they are actually close and whether or not home health needs to discharge the patient or not. Nonetheless my concern is this point obviously is that without actually seeing her and being able to evaluate this directly I cannot ensure that she is completely healed which is the question that I'm being asked. 04/22/19 on evaluation today patient presents for her first evaluation since last time I saw her which was actually February 14, 2019. I did do a telehealth visit last week in which point it was questionable whether or not she may be healed and had to bring her in today for confirmation. With that being said she does seem to be doing quite well at this point which is good news. There does not appear to be any drainage in the deed I believe her wounds may be healed. Readmission: 09/04/2019 on evaluation today patient appears to be doing unfortunately somewhat more poorly in regard to her left foot ulcer secondary to a wound that began on 08/21/2019 at least when she first noticed this. Fortunately she has not had any evidence of active infection at this time. Systemically. I also do not necessarily see any evidence of infection at the blister/wound site on the first metatarsal head plantar aspect. This almost appears to be something that may have  just rubbed inappropriately causing this to breakdown. They did not want a wait too long to come in to be seen as again she had significant issues in the past with wounds that took quite a while to heal in fact it  was close to 2 years. Nonetheless this does not appear to be quite that bad but again we do need to remove some of the necrotic tissue from the surface of the wound to tell exactly the extent. She does not appear to have any significant arterial disease at this point and again her last ABIs and TBI's are recorded above in the alert section her left ABI was 1.27 with a TBI of 0.72 to the right ABI 1.08 with a TBI of 0.39. Other than this the patient has been doing quite well since I last saw her and that was in May 2020. 09/11/2019 on evaluation today patient appeared to be doing very well with regard to her plantar foot ulcer on the left. In fact this appears to be almost completely healed which is awesome. That is after just 1 week of intervention. With that being said there is no signs of active infection at this time. KEONDRIA, SIEVER ([id number]) 09/18/2019 on evaluation today patient actually appears to be doing excellent in fact she is completely healed based on what I am seeing at this point. Fortunately there is no signs of active infection at this time and overall patient is very pleased to hear that this area has healed so quickly. Readmission: 05/13/2021 upon evaluation today this patient presents for reevaluation here in the clinic. This is a wound that actually we previously took care of. She had 1 on the right ankle and the left the left turned out to be be harder due to to heal but nonetheless is doing great at this point as the right that has reopened and it was noted first just several weeks ago with a scab over it and came off in just the past few days. Fortunately there does not appear to be any obvious evidence of significant active infection at this time which is great news. No fevers, chills, nausea, vomiting, or diarrhea. 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 There does not appear to be any signs of this interfering in any  way with her wound. She does have swelling we previously had compression socks for her ordered but again it does not look like she wears these on a regular basis by any means. 05/26/2021 upon evaluation today patient appears to be doing well with regard to her wound which is actually showing signs of excellent improvement. There does not appear to be any signs of active infection which is great news and overall very pleased with where things stand today. No fevers, chills, nausea, vomiting, or diarrhea. 06/02/2021 upon evaluation today patient's wound actually showing signs of excellent improvement. Fortunately there does not appear to be any signs of active infection which is great news. I think the patient is making good progress with regard to her wounds in general. 06/09/2021 upon evaluation today patient appears to be doing excellent in regard to her wounds currently. Fortunately there does not appear to be any signs of active infection which is great news. No fevers, chills, nausea, vomiting, or diarrhea. Overall extremely pleased with where things stand today. I think the patient is making excellent progress. 06/16/2021 upon evaluation today patient appears to be doing well in regard to  her wound. This is going require little bit of debridement today and that was discussed with the patient. Otherwise she seems to be doing quite well and I am actually very pleased with where things stand at this point. No fevers, chills, nausea, vomiting, or diarrhea. 06/23/2021 upon evaluation today patient appears to be doing well with regard to her wounds. She has been tolerating the dressing changes without complication. Fortunately there does not appear to be any evidence of infection and she has not had air in her home which she actually lives at an assisted living that got fixed this morning. With that being said because of that her wrap has been extremely hot and bothersome for her over the past  week. 06/30/2021 upon evaluation today patient is actually making excellent progress in regard to her ankle ulcer. She has been tolerating the dressing changes without complication and overall extremely pleased with where things stand there does not appear to be any evidence of active infection which is great news. No fevers, chills, nausea, vomiting, or diarrhea. 07/07/21 upon evaluation today patients and culture on the right actually appears to be doing quite well. There does not appear to be any signs of infection and overall very pleased with where things stand today. No fevers, chills, nausea, or vomiting noted at this time. 07/14/2021 unfortunately the patient today has some evidence of deep tissue injury and pressure getting to the ankle region. Again I am not exactly sure what is going on here but this is very similar to issues that we have had in the past. I explained to the patient that she needs to be very mindful of exactly what is happening I think sleeping in bed is probably the main issue here although there could be other culprits I am not sure what else would potentially lead to this kind of a problem for her. 07/21/2021 upon evaluation today patient's wound actually showing signs of improvement compared to last week. Fortunately there does not appear to be any signs of active infection which is great news and overall very pleased with where things stand in that regard. With that being said I do believe that she is continuing to show signs of overall of getting better although I think this is still basically about what we were 2 weeks ago due to the worsening and now improvement. 07/28/2021 upon evaluation today patient appears to be doing well with regard to her wound. She does have some slough buildup on the surface of the wound which I would have to manage today. Fortunately there is no sign of active infection at this time. No fevers, chills, nausea, vomiting, or diarrhea. 08/04/2021  upon evaluation today patient appears to be doing about the same in regard to her wound. To be perfectly honest I am beginning to be a little bit concerned about the overall appearance of the wound bed. I do think possibly taking a sample right around the margin of the wound could be beneficial for her as far as identifying anything such as an inflammatory process or to be honest even a skin tag cancer type process that may be of concern here. Fortunately there does not appear to be any evidence of active infection at this time which is great news she is not having any pain also great news. 08/11/2021 upon evaluation today patient appears to be doing well with regard to her wound. The good news is I did review her biopsy results and it showed some inflammatory mixed findings  but nothing that appeared to be malignant which is great news. Overall this is more of a chronic venous stasis type issue which again is more what we have been treating. Nonetheless I just wanted to make sure before going forward that there was not anything more untoward going on at this point. 08/18/2021 upon evaluation today patient appears to be doing well with regard to her ankle ulcer. Fortunately there does not appear to be any signs of active infection at this time which is great overall wound is dramatically improved compared to last week. Since last week I have actually placed her on doxycycline and subsequently this is a good option as far as the findings are concerned at this point. 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 the doxycycline is doing that. There does not appear to be any evidence of active infection systemically which is great news. 08/25/2021 upon evaluation today patient appears to be doing well with regard to her wound. I feel like we are finally get back on track as far  as healing is concerned I am much happier with the overall appearance today. I do think that she is tolerating the dressing changes without complication which is great news. We have been using Hydrofera Blue which I think is a good option. The good news is she is also doing great in regard to her compression sock on the left which is a zipper compression that seems to be doing a great job keeping her edema under good control. 09/01/2021 upon evaluation today patient appears to be doing well with regard to her wound. Infection seems to be under much better control which is great news and very pleased in that regard. Fortunately there does not appear to be any signs of infection currently. ENGLISH, TOMER ([id number]) 09/08/2021 upon evaluation today patient actually appears to be making good progress in regard to her wound. She has been tolerating the dressing changes without complication. Fortunately there does not appear to be any evidence of active infection at this time which is great news as well. No fevers, chills, nausea, vomiting, or diarrhea. 09/22/2021 upon evaluation today patient appears to be doing well with regard to her wound although is very slow to heal. We have not looked into Apligraf yet I think that is something that we should see about doing. 09/29/2021 upon evaluation today patient's wound is actually showing signs of doing about the same. I am not seeing any evidence of worsening but also no significant evidence of improvement. We did gain approval for the organogenesis products all except for Apligraf as covered by her insurance. With that being said I do think that we can go ahead and proceed with the NuShield if the patient and her family in agreement of the plan I discussed that with him today she does have a 20% coinsurance which we also discussed. 10/06/2021 upon evaluation today patient appears to unfortunately be doing a little bit worse she appears to be infected  based on what I am seeing. Fortunately there does not appear to be any signs of active infection at this time which is great news. Unfortunately it does appear to be some evidence of around the wound edge indicated by way of erythema and warmth as well as redness 10/13/2021 upon evaluation today patient actually appears to be doing excellent in regard to her  ankle ulcer compared to what it was. 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 I think were fine to go ahead and apply the NuShield today. 10/20/2021 upon evaluation today patient appears to be doing excellent in fact the NuShield seems to have done all some for her thus far. I am actually very pleased with where we stand and overall I think that she is making great progress. There is no evidence of active infection at this time. 11/23; patient presents for follow-up. She has no issues or complaints today. She denies signs of infection. She reports stability in her wound healing. 11/03/2021 upon evaluation today patient appears to be doing well with regard to her wounds. She has been tolerating the dressing changes without complication. Fortunately there is no signs of active infection at this time. 11/10/2021 upon evaluation today patient appears to be doing well with regard to her right ankle which is actually showing signs of improvement with the NuShield I am very pleased. Subsequently in regards to the left ankle this appears to be doing okay with no evidence of issue here either. With that being said she has a significant contusion on the left leg from having fallen 2 days ago. She tells me that she was using her walker going to the closet and then when she got to the closet turned around to get something out at which point she fell according to  the story. Nonetheless she does have a hematoma just below her knee on the anterior portion of her shin. My hope is that this will not open until wound although we do need I think Some compression over it also think that we need to have her use an ice as well to help with the swelling and prevent this from getting worse. The last thing she needs is a wound opened up here. 11/17/2021 upon evaluation patient's right ankle actually showing signs of improvement based on what I see currently there is a lot of new skin growth coming in which is great news. Nonetheless I do feel like that the patient is showing improvement as well in regard to her left leg with the use of the Tubigrip it swollen but not as bruised as what I would have expected after what I was seeing last week. Nonetheless I do think doubling up on the Tubigrip would probably be beneficial to try to keep some of the edema under better control here. 11/24/2021 upon evaluation today patient appears to be doing excellent in regard to her wound. This is actually measuring significantly smaller which is great news. Fortunately I do not see any signs of active infection locally nor systemically at this point. Overall I am very pleased with how the NuShield is doing. Electronic Signature(s) Signed: 11/24/2021 11:06:35 AM By: Worthy Keeler PA-C Entered By: Worthy Keeler on 11/24/2021 11:06:35 [name], [name] ([id number]) -------------------------------------------------------------------------------- Physical Exam Details Patient Name: [name], [name] Date of Service: 11/24/2021 10:15 AM Medical Record Number: [id number] Patient Account Number: [id number] Date of Birth/Sex: 26-Nov-1923 (87 y.o. F) Treating RN: Carlene Coria Primary Care Provider: Adrian Prows Other Clinician: Referring Provider: Adrian Prows Treating Provider/Extender: Skipper Cliche in Treatment: 23 Constitutional Well-nourished and well-hydrated in  no acute distress. Respiratory normal breathing without difficulty. Psychiatric this patient is able to make  decisions and demonstrates good insight into disease process. Alert and Oriented x 3. pleasant and cooperative. Notes Upon inspection patient's wound bed showed signs of good granulation and epithelization at this point. Fortunately I think that she is doing excellent there is no need for sharp debridement and overall the wound is measuring significantly smaller which is great news. Electronic Signature(s) Signed: 11/24/2021 11:07:04 AM By: Worthy Keeler PA-C Entered By: Worthy Keeler on 11/24/2021 11:07:04 [name], [name] ([id number]) -------------------------------------------------------------------------------- Physician Orders Details Patient Name: [name] Date of Service: 11/24/2021 10:15 AM Medical Record Number: [id number] Patient Account Number: [id number] Date of Birth/Sex: 09-10-1923 (87 y.o. F) Treating RN: Carlene Coria Primary Care Provider: Adrian Prows Other Clinician: Referring Provider: Adrian Prows Treating Provider/Extender: Skipper Cliche in Treatment: 23 Verbal / Phone Orders: No Diagnosis Coding ICD-10 Coding Code Description 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, stage 3 I89.0 Lymphedema, not elsewhere classified I87.2 Venous insufficiency (chronic) (peripheral) S80.12XA Contusion of left lower leg, initial encounter Z95.0 Presence of cardiac pacemaker Z79.01 Long term (current) use of anticoagulants Follow-up Appointments o Return Appointment in 1 week. Bathing/ Shower/ Hygiene o May shower with wound dressing protected with water repellent cover or cast protector. Cellular or Tissue Based Products o Cellular or Tissue Based Product Type: - NuShield #7 o Cellular or Tissue Based Product applied to wound bed; including contact layer, fixation with steri-strips, dry gauze and cover dressing. (DO NOT  REMOVE). Edema Control - Lymphedema / Segmental Compressive Device / Other o Optional: One layer of unna paste to top of compression wrap (to act as an anchor). - and at toes o Tubigrip single layer applied. - size D to left lower leg to above the knee o Elevate, Exercise Daily and Avoid Standing for Long Periods of Time. o Elevate legs to the level of the heart and pump ankles as often as possible o Elevate leg(s) parallel to the floor when sitting. Wound Treatment Wound #7 - Malleolus Wound Laterality: Right, Lateral Cleanser: Soap and Water 1 x Per Week/30 Days Discharge Instructions: Gently cleanse wound with antibacterial soap, rinse and pat dry prior to dressing wounds Secondary Dressing: Gauze 1 x Per Week/30 Days Discharge Instructions: As directed: dry, moistened with saline or moistened with Dakins Solution Compression Wrap: Profore Lite LF 3 Multilayer Compression Bandaging System 1 x Per Week/30 Days Discharge Instructions: Apply 3 multi-layer wrap as prescribed. Electronic Signature(s) Signed: 11/24/2021 11:33:18 AM By: Carlene Coria RN Signed: 11/24/2021 6:19:38 PM By: Worthy Keeler PA-C Entered By: Carlene Coria on 11/24/2021 10:33:31 [name], [name] ([id number]) -------------------------------------------------------------------------------- Problem List Details Patient Name: [name] Date of Service: 11/24/2021 10:15 AM Medical Record Number: [id number] Patient Account Number: [id number] Date of Birth/Sex: Apr 28, 1923 (87 y.o. F) Treating RN: Carlene Coria Primary Care Provider: Adrian Prows Other Clinician: Referring Provider: Adrian Prows Treating Provider/Extender: Skipper Cliche in Treatment: 27 Active Problems ICD-10 Encounter Code Description Active Date MDM Diagnosis 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, stage 3 05/13/2021 No Yes I89.0 Lymphedema, not elsewhere classified 05/13/2021 No Yes I87.2 Venous insufficiency  (chronic) (peripheral) 05/13/2021 No Yes S80.12XA Contusion of left lower leg, initial encounter 11/10/2021 No Yes Z95.0 Presence of cardiac pacemaker 05/13/2021 No Yes Z79.01 Long term (current) use of anticoagulants 05/13/2021 No Yes Inactive Problems Resolved Problems Electronic Signature(s) Signed: 11/24/2021 10:26:34 AM By: Worthy Keeler PA-C Entered By: Worthy Keeler on 11/24/2021 10:26:34 [name], [name] ([id number]) -------------------------------------------------------------------------------- Progress Note Details Patient Name: [name] Date of  Service: 11/24/2021 10:15 AM Medical Record Number: [id number] Patient Account Number: [id number] Date of Birth/Sex: 28-Jul-1923 (87 y.o. 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 Dr. Amalia Hailey the podiatrist saw her on 07/20/2017 and sent her to the wound center for possible hyperbaric oxygen therapy. past medical history of peripheral vascular disease, varicose veins, status post appendectomy, basal cell carcinoma excision from the left leg, cholecystectomy, pacemaker placement, right lower extremity angiography done by Dr. dew in March 2017 with placement of a stent. there is also note of a successful ablation of the right small saphenous vein done which was reviewed by ultrasound on 10/24/2016. the patient had a right small saphenous vein ablation done on 10/20/2016. The patient has never been a smoker. She has been seen by Dr. Corene Cornea dew the vascular surgeon who most recently  saw her on 06/15/2017 for evaluation of ongoing problems with right leg swelling. She had a lower extremity arterial duplex examination done(02/13/17) which showed patent distal right superficial femoral artery stent and above-the-knee popliteal stent without evidence of restenosis. The ABI was more than 1.3 on the right and more than 1.3 on the left. This was consistent with noncompressible arteries due to medial calcification. The right great toe pressure and PPG waveforms are within normal limits and the left great toe pressure and PPG waveforms are decreased. he recommended she continue to wear her compression stockings and continue with elevation. She is scheduled to have a noninvasive arterial study in the near future 08/16/2017 -- had a lower extremity arterial duplex examination done which showed patent distal right superficial femoral artery stent and above-the-knee popliteal stent without evidence of restenosis. The ABI was more than 1.3 on the right and more than 1.3 on the left. This was consistent with noncompressible arteries due to medial calcification. The right great toe pressure and PPG waveforms are within normal limits and the left great toe pressure and PPG waveforms are decreased. the x-ray of the right ankle has not yet been done 08/24/2017 -- had a right ankle x-ray -- IMPRESSION:1. No fracture, bone lesion or evidence of osteomyelitis. 2. Lateral soft tissue swelling with a soft tissue ulcer. she has not yet seen the vascular surgeon for review 08/31/17 on evaluation today patient's wound appears to be showing signs of improvement. She still with her appointment with vascular in order to review her results of her vascular study and then determine if any intervention would be recommended at that time. No fevers, chills, nausea, or vomiting noted at this time. She has been tolerating the dressing changes without complication. 09/28/17 on evaluation today patient's wound appears  to show signs of good improvement in regard to the granulation tissue which is surfacing. There is still a layer of slough covering the wound and the posterior portion is still significantly deeper than the anterior nonetheless there has been some good sign of things moving towards the better. She is going to go back to Dr. dew for reevaluation to ensure her blood flow is still appropriate. That will be before her next evaluation with [place] next week. No fevers, chills, nausea, or vomiting noted at this time. Patient does have  some discomfort rated to be a 3-4/10 depending on activity specifically cleansing the wound makes it worse. 10/05/2017 -- the patient was seen by Dr. Lucky Cowboy last week and noninvasive studies showed a normal right ABI with brisk triphasic waveforms consistent with no arterial insufficiency including normal digital pressures. The duplex showed a patent distal right SFA stent and the proximal SFA was also normal. He was pleased with her test and thought she should have enough of perfusion for normal wound healing. He would see her back in 6 months time. 12/21/17 on evaluation today patient appears to be doing fairly well in regard to her right lateral ankle wound. Unfortunately the main issue that she is expansion at this point is that she is having some issues with what appears to be some cellulitis in the right anterior shin. She has also been noting a little bit of uncomfortable feeling especially last night and her ankle area. I'm afraid that she made the developing a little bit of an infection. With that being said I think it is in the early stages. 12/28/17 on evaluation today patient's ankle appears to be doing excellent. She's making good progress at this point the cellulitis seems to have improved after last week's evaluation. Overall she is having no significant discomfort which is excellent news. She does have an appointment with Dr. dew on March 29, 2018 for reevaluation in  regard to the stent he placed. She seems to have excellent blood flow in the right lower extremity. 01/19/12 on evaluation today patient's wound appears to be doing very well. In fact she does not appear to require debridement at this point, there's no evidence of infection, and overall from the standpoint of the wound she seems to be doing very well. With that being said I believe that it may be time to switch to different dressing away from the [facility] Dressing she tells me she does have a lot going on her friend actually passed away yesterday and she's also having a lot of issues with her husband this obviously is weighing heavy on her as far as your thoughts and concerns today. 01/25/18 on evaluation today patient appears to be doing fairly well in regard to her right lateral malleolus. She has been tolerating the dressing changes without complication. Overall I feel like this is definitely showing signs of improvement as far as how the overall appearance of the wound is there's also evidence of epithelium start to migrate over the granulation tissue. In general I think that she is progressing nicely as far as the wound is concerned. The only concern she really has is whether or not we can switch to every other week visits in order to avoid having as many [name], [name] ([id number]) appointments as her daughters have a difficult time getting her to her appointments as well as the patient's husband to his he is not doing very well at this point. 02/22/18 on evaluation today patient's right lateral malleolus ulcer appears to be doing great. She has been tolerating the dressing changes without complication. Overall you making excellent progress at this time. Patient is having no significant discomfort. 03/15/18 on evaluation today patient appears to be doing much more poorly in regard to her right lateral ankle ulcer at this point. Unfortunately since have last seen her her husband has passed  just a few days ago is obviously weighed heavily on her her daughter also had surgery well she is with her today as usual. There does not appear  to be any evidence of infection she does seem to have significant contusion/deep tissue injury to the right lateral malleolus which was not noted previous when I saw her last. It's hard to tell of exactly when this injury occurred although during the time she was spending the night in the hospital this may have been most likely. 03/22/18 on evaluation today patient appears to actually be doing very well in regard to her ulcer. She did unfortunately have a setback which was noted last week however the good news is we seem to be getting back on track and in fact the wound in the core did still have some necrotic tissue which will be addressed at this point today but in general I'm seeing signs that things are on the up and up. She is glad to hear this obviously she's been somewhat concerned that due to the how her wound digressed more recently. 03/29/18 on evaluation today patient appears to be doing fairly well in regard to her right lower extremity lateral malleolus ulcer. She unfortunately does have a new area of pressure injury over the inferior portion where the wound has opened up a little bit larger secondary to the pressure she seems to be getting. She does tell me sometimes when she sleeps at night that it actually hurts and does seem to be pushing on the area little bit more unfortunately. There does not appear to be any evidence of infection which is good news. She has been tolerating the dressing changes without complication. She also did have some bruising in the left second and third toes due to the fact that she may have bump this or injured it although she has neuropathy so she does not feel she did move recently that may have been where this came from. Nonetheless there does not appear to be any evidence of infection at this time. 04/12/18 on  evaluation today patient's wound on the right lateral ankle actually appears to be doing a little bit better with a lot of necrotic docking tissue centrally loosening up in clearing away. However she does have the beginnings of a deep tissue injury on the left lateral malleolus likely due to the fact we've been trying offload the right as much as we have. I think she may benefit from an assistive soft device to help with offloading and it looks like they're looking at one of the doughnut conditions that wraps around the lower leg to offload which I think will definitely do a good job. With that being said I think we definitely need to address this issue on the left before it becomes a wound. Patient is not having significant pain. 04/19/18 on evaluation today patient appears to be doing excellent in regard to the progress she's made with her right lateral ankle ulcer. The left ankle region which did show evidence of a deep tissue injury seems to be resolving there's little fluid noted underneath and a blister there's nothing open at this point in time overall I feel like this is progressing nicely which is good news. She does not seem to be having significant discomfort at this point which is also good news. 04/25/18-She is here in follow up evaluation for bilateral lateral malleolar ulcers. The right lateral malleolus ulcer with pale subcutaneous tissue exposure, central area of ulcer with tendon/periosteum exposed. The left lateral malleolus ulcer now with central area of nonviable tissue, otherwise deep tissue injury. She is wearing compression wraps to the left lower extremity, she will place  the right lower extremity compression wraps on when she gets home. She will be out of town over the weekend and return next week and follow-up appointment. She completed her doxycycline this morning 05/03/18 on evaluation today patient appears to be doing very well in regard to her right lateral ankle ulcer in  general. At least she's showing some signs of improvement in this regard. Unfortunately she has some additional injury to the left lateral malleolus region which appears to be new likely even over the past several days. Again this determination is based on the overall appearance. With that being said the patient is obviously frustrated about this currently. 05/10/18-She is here in follow-up evaluation for bilateral lateral malleolar ulcers. She states she has purchased offloading shoes/boots and they will arrive tomorrow. She was asked to bring them in the office at next week's appointment so her provider is aware of product being utilized. She continues to sleep on right or left side, she has been encouraged to sleep on her back. The right lateral malleolus ulcer is precariously close to peri-osteum; will order xray. The left lateral malleolus ulcer is improved. Will switch back to santyl; she will follow up next week. 05/17/18 on evaluation today patient actually appears to be doing very well in regard to her malleolus her ulcers compared to last time I saw them. She does not seem to have as much in the way of contusion at this point which is great news. With that being said she does continue to have discomfort and I do believe that she is still continuing to benefit from the offloading/pressure reducing boots that were recommended. I think this is the key to trying to get this to heal up completely. 05/24/18 on evaluation today patient actually appears to be doing worse at this point in time unfortunately compared to her last week's evaluation. She is having really no increased pain which is good news unfortunately she does have more maceration in your theme and noted surrounding the right lateral ankle the left lateral ankle is not really is erythematous I do not see signs of the overt cellulitis on that side. Unfortunately the wounds do not seem to have shown any signs of improvement since the last  evaluation. She also has significant swelling especially on the right compared to previous some of this may be due to infection however also think that she may be served better while she has these wounds by compression wrapping versus continuing to use the Juxta-Lite for the time being. Especially with the amount of drainage that she is experiencing at this point. No fevers, chills, nausea, or vomiting noted at this time. 05/31/18 on evaluation today patient appears to actually be doing better in regard to her right lateral lower extremity ulcer specifically on the malleolus region. She has been tolerating the antibiotic without complication. With that being said she still continues to have issues but a little bit of redness although nothing like she what she was experiencing previous. She still continues to pressure to her ankle area she did get the problem on offloading boots unfortunately she will not wear them she states there too uncomfortable and she can't get in and out of the bed. Nonetheless at this point her wounds seem to be continually getting worse which is not what we want I'm getting somewhat concerned about her progress and how things are going to proceed if we do not intervene in some way shape or form. I therefore had a very lengthy conversation  today about offloading yet again and even made a specific suggestion for switching her to a memory foam mattress and even gave the information for a specific one that they could look at getting if it was something that they were interested in considering. She does not want to be considered for a hospital bed air mattress although honestly insurance would not cover it that she does not have any wounds on her trunk. 06/14/18 on evaluation today both wounds over the bilateral lateral malleolus her ulcers appear to be doing better there's no evidence of pressure injury at this point. She did get the foam mattress for her bed and this does seem to  have been extremely beneficial for her in my pinion. Her daughter states that she is having difficulty getting out of bed because of how soft it is. The patient also relates this to be. Nonetheless I do feel like she's actually doing better. Unfortunately right after and around the time she was getting the mattress she also sustained a fall when she got up to go pick up the phone and ended up injuring her right elbow she has 18 sutures in place. We are not caring for this currently although home health is going to be taking the sutures out shortly. Nonetheless this may be something that we need to evaluate going forward. It depends on BERTIE, MCCONATHY ([id number]) how well it has or has not healed in the end. She also recently saw an orthopedic specialist for an injection in the right shoulder just before her fall unfortunately the fall seems to have worsened her pain. 06/21/18 on evaluation today patient appears to be doing about the same in regard to her lateral malleolus ulcers. Both appear to be just a little bit deeper but again we are clinging away the necrotic and dead tissue which I think is why this is progressing towards a deeper realm as opposed to improving from my measurement standpoint in that regard. Nonetheless she has been tolerating the dressing changes she absolutely hates the memory foam mattress topper that was obtained for her nonetheless I do believe this is still doing excellent as far as taking care of excess pressure in regard to the lateral malleolus regions. She in fact has no pressure injury that I see whereas in weeks past it was week by week I was constantly seeing new pressure injuries. Overall I think it has been very beneficial for her. 07/03/18; patient arrives in my clinic today. She has deep punched out areas over her bilateral lateral malleoli. The area on the right has some more depth. We spent a lot of time today talking about pressure relief for these areas.  This started when her daughter asked for a prescription for a memory foam mattress. I have never written a prescription for a mattress and I don't think insurances would pay for that on an ordinary bed. In any case he came up that she has foam boots that she refuses to wear. I would suggest going to these before any other offloading issues when she is in bed. They say she is meticulous about offloading this the rest of the day 07/10/18- She is seen in follow-up evaluation for bilateral, lateral malleolus ulcers. There is no improvement in the ulcers. She has purchased and is sleeping on a memory foam mattress/overlay, she has been using the offloading boots nightly over the past week. She has a follow up appointment with vascular medicine at the end of October, in my opinion  this follow up should be expedited given her deterioration and suboptimal TBI results. We will order plain film xray of the left ankle as deeper structures are palpable; would consider having MRI, regardless of xray report(s). The ulcers will be treated with iodoflex/iodosorb, she is unable to safely change the dressings daily with santyl. 07/19/18 on evaluation today patient appears to be doing in general visually well in regard to her bilateral lateral malleolus ulcers. She has been tolerating the dressing changes without complication which is good news. With that being said we did have an x-ray performed on 07/12/18 which revealed a slight loosen see in the lateral portion of the distal left fibula which may represent artifact but underline lytic destruction or osteomyelitis could not be excluded. MRI was recommended. With that being said we can see about getting the patient scheduled for an MRI to further evaluate this area. In fact we have that scheduled currently for August 20 19,019. 07/26/18 on evaluation today patient's wound on the right lateral ankle actually appears to be doing fairly well at this point in my pinion. She  has made some good progress currently. With that being said unfortunately in regard to the left lateral ankle ulcer this seems to be a little bit more problematic at this time. In fact as I further evaluated the situation she actually had bone exposed which is the first time that's been the case in the bone appear to be necrotic. Currently I did review patient's note from Dr. Bunnie Domino office with [facility]. He stated that ABI was 1.26 on the right and 0.95 on the left with good waveforms. Her perfusion is stable not reduced from previous studies and her digital waveforms were pretty good particularly on the right. His conclusion upon review of the note was that there was not much she could do to improve her perfusion and he felt she was adequate for wound healing. His suggestion was that she continued to see [place] and consider a synthetic skin graft if there was no underlying infection. He plans to see her back in six months or as needed. 08/01/18 on evaluation today patient appears to be doing better in regard to her right lateral ankle ulcer. Her left lateral ankle ulcer is about the same she still has bone involvement in evidence of necrosis. There does not appear to be evidence of infection at this time On the right lateral lower extremity. I have started her on the Augmentin she picked this up and started this yesterday. This is to get her through until she sees infectious disease which is scheduled for 08/12/18. 08/06/18 on evaluation today patient appears to be doing rather well considering my discussion with patient's daughter at the end of last week. The area which was marked where she had erythema seems to be improved and this is good news. With that being said overall the patient seems to be making good improvement when it comes to the overall appearance of the right lateral ankle ulcer although this has been slow she at least is coming around in this regard. Unfortunately in  regard to the left lateral ankle ulcer this is osteomyelitis based on the pathology report as well is bone culture. Nonetheless we are still waiting CT scan. Unfortunately the MRI we originally ordered cannot be performed as the patient is a pacemaker which I had overlooked. Nonetheless we are working on the CT scan approval and scheduling as of now. She did go to the hospital over  the weekend and was placed on IV Cefzo for a couple of days. Fortunately this seems to have improved the erythema quite significantly which is good news. There does not appear to be any evidence of worsening infection at this time. She did have some bleeding after the last debridement therefore I did not perform any sharp debridement in regard to left lateral ankle at this point. Patient has been approved for a snap vac for the right lateral ankle. 08/14/18; the patient with wounds over her bilateral lateral malleoli. The area on the right actually looks quite good. Been using a snap back on this area. Healthy granulation and appears to be filling in. Unfortunately the area on the left is really problematic. 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 Also noted to have cellulitis. She saw Dr. Novella Olive of infectious disease today and was put on linezolid. We are able to verify this with her pharmacy. She is completed the Augmentin that she was already on. We've been using Iodoflex to this area 08/23/18 on evaluation today patient's wounds both actually appear to be doing better compared to my prior evaluations. Fortunately she showing signs of good improvement in regard to the overall wound status especially where were using the snap vac on the right. In regard to left lateral malleolus the wound bed actually appears to be much cleaner than previously noted. I do not feel any phone directly probed during evaluation today and though there is tendon  noted this does not appear to be necrotic it's actually fairly good as far as the overall appearance of the tendon is concerned. In general the wound bed actually appears to be doing significantly better than it was previous. Patient is currently in the care of [name] and I did review that note today. He actually has her on two weeks of linezolid and then following the patient will be on 1-2 months of Keflex. That is the plan currently. She has been on antibiotics therapy as prescribed by myself initially starting on July 30, 2018 and has been on that continuously up to this point. 08/30/18 on evaluation today patient actually appears to be doing much better in regard to her right lateral malleolus ulcer. She has been tolerating the dressing changes specifically the snap vac without complication although she did have some issues with the seal currently. Apparently there was some trouble with getting it to maintain over the past week past Sunday. Nonetheless overall the wound appears better in regard to the right lateral malleolus region. In regard to left lateral malleolus this actually show some signs of additional granulation although there still tendon noted in the base of the wound this appears to be healthy not necrotic in any way whatsoever. We are considering potentially using a snap vac for the left lateral malleolus as well the product wrap from [employer], [place], was present in the clinic today we're going to see this patient I did have her come in with me after obtaining consent from the patient and her daughter in order to look at the wound and see if there's any recommendation one way or another as to whether or not they felt the snapback could be beneficial for the left lateral malleolus region. But TAMEE, BATTIN ([id number]) the conclusion was that it might be but that this is definitely a little bit deeper wound than what traditionally would be utilized for a snap vac. 09/06/18  on  evaluation today patient actually appears to be doing excellent in my pinion in regard to both ankle ulcers. She has been tolerating the dressing changes without complication which is great news. Specifically we have been using the snap vac. In regard to the right ankle I'm not even sure that this is going to be necessary for today and following as the wound has filled in quite nicely. In regard to the left ankle I do believe that we're seeing excellent epithelialization from the edge as well as granulation in the central portion the tendon is still exposed but there's no evidence of necrotic bone and in general I feel like the patient has made excellent progress even compared to last week with just one week of the snap vac. 09/11/18; this is a patient who has wounds on her bilateral lateral malleoli. Initially both of these were deep stage IV wounds in the setting of chronic arterial insufficiency. She has been revascularized. As I understand think she been using snap vacs to both of these wounds however the area on the right became more superficial and currently she is only using it on the left. Using silver collagen on the right and silver collagen under the back on the left I believe 09/19/18 on evaluation today patient actually appears to be doing very well in regard to her lateral malleolus or ulcers bilaterally. She has been tolerating the dressing changes without complication. Fortunately there does not appear to be any evidence of infection at this time. Overall I feel like she is improving in an excellent manner and I'm very pleased with the fact that everything seems to be turning towards the better for her. This has obviously been a long road. 09/27/18 on evaluation today patient actually appears to be doing very well in regard to her bilateral lateral malleolus ulcers. She has been tolerating the dressing changes without complication. Fortunately there does not appear to be any evidence of  infection at this time which is also great news. No fevers, chills, nausea, or vomiting noted at this time. Overall I feel like she is doing excellent with the snap vac on the left malleolus. She had 40 mL of fluid collection over the past week. 10/04/18 on evaluation today patient actually appears to be doing well in regard to her bilateral lateral malleolus ulcers. She continues to tolerate the dressing changes without complication. One issue that I see is the snap vac on the left lateral malleolus which appears to have sealed off some fluid underlying this area and has not really allowed it to heal to the degree that I would like to see. For that reason I did suggest at this point we may want to pack a small piece of packing strip into this region to allow it to more effectively wick out fluid. 10/11/18 in general the patient today does not feel that she has been doing very well. She's been a little bit lethargic and subsequently is having bodyaches as well according to what she tells me today. With that being said overall she has been concerned with the fact that something may be worsening although to be honest her wounds really have not been appearing poorly. She does have a new ulcer on her left heel unfortunately. This may be pressure related. Nonetheless it seems to me to have potentially started at least as a blister I do not see any evidence of deep tissue injury. In regard to the left ankle the snap vac still seems to  be causing the ceiling off of the deeper part of the wound which is in turn trapping fluid. I'm not extremely pleased with the overall appearance as far as progress from last week to this week therefore I'm gonna discontinue the snap vac at this point. 10/18/18 patient unfortunately this point has not been feeling well for the past several days. She was seen by Grayland Ormond her primary care provider who is a Librarian, academic at [facility]. Subsequently she states that  she's been very weak and generally feeling malaise. No fevers, chills, nausea, or vomiting noted at this time. With that being said bloodwork was performed at the PCP office on the 11th of this month which showed a white blood cell count of 10.7. This was repeated today and shows a white blood cell count of 12.4. This does show signs of worsening. Coupled with the fact that she is feeling worse and that her left ankle wound is not really showing signs of improvement I feel like this is an indication that the osteomyelitis is likely exacerbating not improving. Overall I think we may also want to check her C-reactive protein and sedimentation rate. Actually did call [facility] office this afternoon while the patient was in the office here with me. Subsequently based on the findings we discussed treatment possibilities and I think that it is appropriate for [place] to go ahead and initiate treatment with doxycycline which I'm going to do. Subsequently he did agree to see about adding a CRP and sedimentation rate to her orders. If that has not already been drawn to where they can run it they will contact the patient she can come back to have that check. They are in agreement with plan as far as the patient and her daughter are concerned. Nonetheless also think we need to get in touch with Dr. Henreitta Leber office to see about getting the patient scheduled with him as soon as possible. 11/08/18 on evaluation today patient presents for follow-up concerning her bilateral foot and ankle ulcers. I did do an extensive review of her chart in epic today. Subsequently she was seen by [name] he did initiate Cefepime IV antibiotic therapy. Subsequently she had some issues with her PICC line this had to be removed because it was coiled and then replaced. Fortunately that was now settled. Unfortunately she has continued have issues with her left heel as well as the issues that she is experiencing with her bilateral  lateral malleolus regions. I do believe however both areas seem to be doing a little bit better on evaluation today which is good news. No fevers, chills, nausea, or vomiting noted at this time. She actually has an angiogram schedule with Dr. dew on this coming Monday, November 11, 2018. Subsequently the patient states that she is feeling much better especially than what she was roughly 2 weeks ago. She actually had to cancel an appointment because she was feeling so poorly. No fevers, chills, nausea, or vomiting noted at this time. 11/15/18 on evaluation today patient actually is status post having had her angiogram with Dr. dew Monday, four days ago. 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 He had to go and work on several areas of the vasculature fortunately he was able to obtain no more than a 30% residual stenosis throughout post procedure. I reviewed this note today. I think this will definitely help with healing at this time. Fortunately there does not appear to be any  signs of infection and I do feel like ratio already has a better appearance to it. 11/22/18 upon evaluation today patient actually appears to be doing very well in regard to her wounds in general. The right lateral malleolus looks excellent the heel looks better in the left lateral malleolus also appears to be doing a little better. With that being said the right second toe actually appears to be open and training we been watching this is been dry and stable but now is open. 12/03/2018 Seen today for follow-up and management of multiple bilateral lower extremity wounds. New pressure injury of the great toe which is closed at this time. Wound of the right distal second toe appears larger today with deep undermining and a pocket of fluid present within the undermining region. Left and right malleolus is wounds are stable today with no signs and symptoms of infection.Denies any needs or concerns during  exam today. 12/13/18 on evaluation today patient appears to be doing somewhat better in regard to her left heel ulcer. She also seems to be completely healed in regard to the right lateral malleolus ulcer. The left malleolus ulcer is smaller what unfortunately the wounds which are new over the first and second toes of the right foot are what are most concerning at this point especially the second. Both areas did require sharp debridement today. 12/20/18 on evaluation today patient's wound actually appears to be doing better in regard to left lateral ankle and her right lateral ankle continues to remain healed. The hill ulcer on the left is improved. She does have improvement noted as well in regard to both toe ulcers. Overall I'm very pleased in this regard. No fevers, chills, nausea, or vomiting noted at this time. NASHIRA, MCGLYNN ([id number]) 12/23/18 on evaluation today patient is seen after she had her toenails trimmed at the podiatrist office due to issues with her right great toe. There was what appeared to be dark eschar on the surface of the wound which had her in the podiatrist concerned. Nonetheless as I remember that during the last office visit I had utilize silver nitrate of this area I was much less concerned about the situation. Subsequently I was able to clean off much of this tissue without any complication today. This does not appear to show any signs of infection and actually look somewhat better compared to last time post debridement. Her second toe on the right foot actually had callous over and there did appear still be some fluid underneath this that would require debridement today. 12/27/18 on evaluation today patient actually appears to be showing signs of improvement at all locations. Even the left lateral ankle although this is not quite as great as the other sites. Fortunately there does not appear to be any signs of infection at this time and both of her toes on the  right foot seem to be showing signs of improvement which is good news and very pleased in this regard. 01/03/19 on evaluation today patient appears to be doing better for the most part in regard to her wounds in particular. There does not appear to be any evidence of infection at this time which is good news. Fortunately there is no sign of really worsening anywhere except for the right great toe which she does have what appears to be a bruise/deep tissue injury which is very superficial and already resolving. I'm not sure where this came from I questioned her extensively and she does not recall what may have  happened with this. Other than that the patient seems to be doing well even the left lateral ankle ulcer looks good and is getting smaller. 01/10/19 on evaluation today patient appears to be doing well in regard to her left heel wound and both of her toe wounds. Overall I feel like there is definitely improvement here and I'm happy in that regard. With that being said unfortunately she is having issues with the left lateral malleolus ulcer which unfortunately still has a lot of depth to it. This is gonna be a very difficult wound for [place] to be able to truly get to heal. I may want to consider some type of skin substitute to see if this would be of benefit for her. I'll discuss this with her more the next visit most likely. This was something I thought about more at the end of the visit when I was Artie out of the room and the patient had been discharged. 01/17/19 on evaluation today patient appears to be doing very well in regard to her wounds in general. She's been making excellent progress at this time. Fortunately there's no sign of infection at this time either. No fevers, chills, nausea, or vomiting noted at this time. The biggest issue is still her left lateral malleolus where it appears to be doing well and is getting smaller but still shows a small corner where this is deeper and goes down  into what appears to be the joint space. Nonetheless this is taking much longer to heal although it still looks better in smaller than previous evaluations. 01/24/19 on evaluation today patient's wounds actually appear to be doing rather well in general overall. She did require some sharp debridement in regard to the right great toe but everything else appears to be doing excellent no debridement was even necessary. No fevers, chills, nausea, or vomiting noted at this time. 01/31/19 on evaluation today patient actually appears to be doing much better in regard to her left foot wound on the heel as well as the ankle. The right great toe appears to be a little bit worse today this had callous over and trapped a lot of fluid underneath. Fortunately there's no signs of infection at any site which is great news. 02/07/19 on evaluation today patient actually appears to be doing decently well in regard to all of her ulcers at this point. No sharp debridement was required she is a little bit of hyper granulation in regard to the left lateral ankle as well as the left heel but the hill itself is almost completely healed which is excellent news. Overall been very pleased in this regard. 02/14/19 on evaluation today patient actually appears to be doing very well in regard to her ulcers on the right first toe, left lateral malleolus, and left heel. In fact the heel is almost completely healed at this point. The patient does not show any signs of infection which is good news. Overall very pleased with how things have progressed. 04/18/19 Telehealth Evaluation During the COVID-19 National Emergency: Verbal Consent: Obtained from patient Allergies: reviewed and the active list is current. Medication changes: patient has no current medication changes. COVID-19 Screening: 1. Have you traveled internationally or on a cruise ship in the last 14 dayso No 2. Have you had contact with someone with or under investigation for  COVID-19o No 3. Have you had a fever, cough, sore throat, or experiencing shortness of breatho No on evaluation today actually did have a visit with this patient  through a telehealth encounter with her home health nurse. Subsequently it was noted that the patient actually appears to be doing okay in regard to her wounds both the right great toe as well as the left lateral malleolus have shown signs of improvement although this in your theme around the left lateral malleolus there eschar coverings for both locations. The question is whether or not they are actually close and whether or not home health needs to discharge the patient or not. Nonetheless my concern is this point obviously is that without actually seeing her and being able to evaluate this directly I cannot ensure that she is completely healed which is the question that I'm being asked. 04/22/19 on evaluation today patient presents for her first evaluation since last time I saw her which was actually February 14, 2019. I did do a telehealth visit last week in which point it was questionable whether or not she may be healed and had to bring her in today for confirmation. With that being said she does seem to be doing quite well at this point which is good news. There does not appear to be any drainage in the deed I believe her wounds may be healed. Readmission: 09/04/2019 on evaluation today patient appears to be doing unfortunately somewhat more poorly in regard to her left foot ulcer secondary to a wound that began on 08/21/2019 at least when she first noticed this. Fortunately she has not had any evidence of active infection at this time. Systemically. I also do not necessarily see any evidence of infection at the blister/wound site on the first metatarsal head plantar aspect. This almost appears to be something that may have just rubbed inappropriately causing this to breakdown. They did not want a wait too long to come in to be seen as  again she had significant issues in the past with wounds that took quite a while to heal in fact it was close to 2 years. Nonetheless this does not appear to be quite that bad but again we do need to remove some of the necrotic tissue from the surface of the wound to tell exactly the extent. She does not appear to have any significant arterial disease at this point and again her last ABIs and TBI's are recorded above in the alert section her left ABI was 1.27 with a TBI of 0.72 to the right ABI 1.08 with a TBI of 0.39. Other than this the patient has been doing quite ANTONIETTA, LANSDOWNE ([id number]) well since I last saw her and that was in May 2020. 09/11/2019 on evaluation today patient appeared to be doing very well with regard to her plantar foot ulcer on the left. In fact this appears to be almost completely healed which is awesome. That is after just 1 week of intervention. With that being said there is no signs of active infection at this time. 09/18/2019 on evaluation today patient actually appears to be doing excellent in fact she is completely healed based on what I am seeing at this point. Fortunately there is no signs of active infection at this time and overall patient is very pleased to hear that this area has healed so quickly. Readmission: 05/13/2021 upon evaluation today this patient presents for reevaluation here in the clinic. This is a wound that actually we previously took care of. She had 1 on the right ankle and the left the left turned out to be be harder due to to heal but nonetheless is doing great  at this point as the right that has reopened and it was noted first just several weeks ago with a scab over it and came off in just the past few days. Fortunately there does not appear to be any obvious evidence of significant active infection at this time which is great news. No fevers, chills, nausea, vomiting, or diarrhea. The patient does have a history of pacemaker along with  being on Eliquis currently as well. There does not appear to be any signs of this interfering in any way with her wound. She does have swelling we previously had compression socks for her ordered but again it does not look like she wears these on a regular basis by any means. 05/26/2021 upon evaluation today patient appears to be doing well with regard to her wound which is actually showing signs of excellent improvement. There does not appear to be any signs of active infection which is great news and overall very pleased with where things stand today. No fevers, chills, nausea, vomiting, or diarrhea. 06/02/2021 upon evaluation today patient's wound actually showing signs of excellent improvement. Fortunately there does not appear to be any signs of active infection which is great news. I think the patient is making good progress with regard to her wounds in general. 06/09/2021 upon evaluation today patient appears to be doing excellent in regard to her wounds currently. Fortunately there does not appear to be any signs of active infection which is great news. No fevers, chills, nausea, vomiting, or diarrhea. Overall extremely pleased with where things stand today. I think the patient is making excellent progress. 06/16/2021 upon evaluation today patient appears to be doing well in regard to her wound. This is going require little bit of debridement today and that was discussed with the patient. Otherwise she seems to be doing quite well and I am actually very pleased with where things stand at this point. No fevers, chills, nausea, vomiting, or diarrhea. 06/23/2021 upon evaluation today patient appears to be doing well with regard to her wounds. She has been tolerating the dressing changes without complication. Fortunately there does not appear to be any evidence of infection and she has not had air in her home which she actually lives at an assisted living that got fixed this morning. With that being  said because of that her wrap has been extremely hot and bothersome for her over the past week. 06/30/2021 upon evaluation today patient is actually making excellent progress in regard to her ankle ulcer. She has been tolerating the dressing changes without complication and overall extremely pleased with where things stand there does not appear to be any evidence of active infection which is great news. No fevers, chills, nausea, vomiting, or diarrhea. 07/07/21 upon evaluation today patients and culture on the right actually appears to be doing quite well. There does not appear to be any signs of infection and overall very pleased with where things stand today. No fevers, chills, nausea, or vomiting noted at this time. 07/14/2021 unfortunately the patient today has some evidence of deep tissue injury and pressure getting to the ankle region. Again I am not exactly sure what is going on here but this is very similar to issues that we have had in the past. I explained to the patient that she needs to be very mindful of exactly what is happening I think sleeping in bed is probably the main issue here although there could be other culprits I am not sure what else would potentially  lead to this kind of a problem for her. 07/21/2021 upon evaluation today patient's wound actually showing signs of improvement compared to last week. Fortunately there does not appear to be any signs of active infection which is great news and overall very pleased with where things stand in that regard. With that being said I do believe that she is continuing to show signs of overall of getting better although I think this is still basically about what we were 2 weeks ago due to the worsening and now improvement. 07/28/2021 upon evaluation today patient appears to be doing well with regard to her wound. She does have some slough buildup on the surface of the wound which I would have to manage today. Fortunately there is no sign of  active infection at this time. No fevers, chills, nausea, vomiting, or diarrhea. 08/04/2021 upon evaluation today patient appears to be doing about the same in regard to her wound. To be perfectly honest I am beginning to be a little bit concerned about the overall appearance of the wound bed. I do think possibly taking a sample right around the margin of the wound could be beneficial for her as far as identifying anything such as an inflammatory process or to be honest even a skin tag cancer type process that may be of concern here. Fortunately there does not appear to be any evidence of active infection at this time which is great news she is not having any pain also great news. 08/11/2021 upon evaluation today patient appears to be doing well with regard to her wound. The good news is I did review her biopsy results and it showed some inflammatory mixed findings but nothing that appeared to be malignant which is great news. Overall this is more of a chronic venous stasis type issue which again is more what we have been treating. Nonetheless I just wanted to make sure before going forward that there was not anything more untoward going on at this point. 08/18/2021 upon evaluation today patient appears to be doing well with regard to her ankle ulcer. Fortunately there does not appear to be any signs of active infection at this time which is great overall wound is dramatically improved compared to last week. Since last week I have actually placed her on doxycycline and subsequently this is a good option as far as the findings are concerned at this point. 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 the doxycycline is doing that. There does not appear to be any evidence of active infection systemically which is great news. 08/25/2021 upon evaluation today patient appears to be doing  well with regard to her wound. I feel like we are finally get back on track as far as healing is concerned I am much happier with the overall appearance today. I do think that she is tolerating the dressing changes without complication which is great news. We have been using Hydrofera Blue which I think is a good option. The good news is she is also doing great in [place], ILINA XU. ([id number]) regard to her compression sock on the left which is a zipper compression that seems to be doing a great job keeping her edema under good control. 09/01/2021 upon evaluation today patient appears to be doing well with regard to her wound. Infection seems to be under much better control which is  great news and very pleased in that regard. Fortunately there does not appear to be any signs of infection currently. 09/08/2021 upon evaluation today patient actually appears to be making good progress in regard to her wound. She has been tolerating the dressing changes without complication. Fortunately there does not appear to be any evidence of active infection at this time which is great news as well. No fevers, chills, nausea, vomiting, or diarrhea. 09/22/2021 upon evaluation today patient appears to be doing well with regard to her wound although is very slow to heal. We have not looked into Apligraf yet I think that is something that we should see about doing. 09/29/2021 upon evaluation today patient's wound is actually showing signs of doing about the same. I am not seeing any evidence of worsening but also no significant evidence of improvement. We did gain approval for the organogenesis products all except for Apligraf as covered by her insurance. With that being said I do think that we can go ahead and proceed with the NuShield if the patient and her family in agreement of the plan I discussed that with him today she does have a 20% coinsurance which we also discussed. 10/06/2021 upon evaluation today patient  appears to unfortunately be doing a little bit worse she appears to be infected based on what I am seeing. Fortunately there does not appear to be any signs of active infection at this time which is great news. Unfortunately it does appear to be some evidence of around the wound edge indicated by way of erythema and warmth as well as redness 10/13/2021 upon evaluation today patient actually appears to be doing excellent in regard to her ankle ulcer compared to what it was. 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 I think were fine to go ahead and apply the NuShield today. 10/20/2021 upon evaluation today patient appears to be doing excellent in fact the NuShield seems to have done all some for her thus far. I am actually very pleased with where we stand and overall I think that she is making great progress. There is no evidence of active infection at this time. 11/23; patient presents for follow-up. She has no issues or complaints today. She denies signs of infection. She reports stability in her wound healing. 11/03/2021 upon evaluation today patient appears to be doing well with regard to her wounds. She has been tolerating the dressing changes without complication. Fortunately there is no signs of active infection at this time. 11/10/2021 upon evaluation today patient appears to be doing well with regard to her right ankle which is actually showing signs of improvement with the NuShield I am very pleased. Subsequently in regards to the left ankle this appears to be doing okay with no evidence of issue here either. With that being said she has a significant contusion on the left leg from having fallen 2 days ago. She tells me that she was using her walker going to the closet and then when she got to the  closet turned around to get something out at which point she fell according to the story. Nonetheless she does have a hematoma just below her knee on the anterior portion of her shin. My hope is that this will not open until wound although we do need I think  Some compression over it also think that we need to have her use an ice as well to help with the swelling and prevent this from getting worse. The last thing she needs is a wound opened up here. 11/17/2021 upon evaluation patient's right ankle actually showing signs of improvement based on what I see currently there is a lot of new skin growth coming in which is great news. Nonetheless I do feel like that the patient is showing improvement as well in regard to her left leg with the use of the Tubigrip it swollen but not as bruised as what I would have expected after what I was seeing last week. Nonetheless I do think doubling up on the Tubigrip would probably be beneficial to try to keep some of the edema under better control here. 11/24/2021 upon evaluation today patient appears to be doing excellent in regard to her wound. This is actually measuring significantly smaller which is great news. Fortunately I do not see any signs of active infection locally nor systemically at this point. Overall I am very pleased with how the NuShield is doing. Objective Constitutional Well-nourished and well-hydrated in no acute distress. Vitals Time Taken: 10:18 AM, Height: 62 in, Weight: 150 lbs, BMI: 27.4, Temperature: 98.3 F, Pulse: 76 bpm, Respiratory Rate: 18 breaths/min, Blood Pressure: 171/85 mmHg. Respiratory normal breathing without difficulty. Psychiatric this patient is able to make decisions and demonstrates good insight into disease process. Alert and Oriented x 3. pleasant and cooperative. General Notes: Upon inspection patient's wound bed showed signs of good granulation and epithelization at this point. Fortunately I think that  she SHATARRA, WEHLING ([id number]) is doing excellent there is no need for sharp debridement and overall the wound is measuring significantly smaller which is great news. Integumentary (Hair, Skin) Wound #7 status is Open. Original cause of wound was Gradually Appeared. The date acquired was: 05/12/2021. The wound has been in treatment 27 weeks. The wound is located on the Right,Lateral Malleolus. The wound measures 0.6cm length x 0.7cm width x 0.2cm depth; 0.33cm^2 area and 0.066cm^3 volume. There is Fat Layer (Subcutaneous Tissue) exposed. There is a medium amount of serosanguineous drainage noted. There is large (67-100%) granulation within the wound bed. There is no necrotic tissue within the wound bed. Assessment Active Problems ICD-10 Pressure ulcer of right ankle, stage 3 Lymphedema, not elsewhere classified Venous insufficiency (chronic) (peripheral) Contusion of left lower leg, initial encounter Presence of cardiac pacemaker Long term (current) use of anticoagulants Procedures Wound #7 Pre-procedure diagnosis of Wound #7 is a Pressure Ulcer located on the Right,Lateral Malleolus. A skin graft procedure using a bioengineered skin substitute/cellular or tissue based product was performed by Tommie Sams., PA-C with the following instrument(s): Forceps and Scissors. Nushield was applied and secured with Steri-Strips. 1.6 sq cm of product was utilized and 0 sq cm was wasted. Post Application, adaptic was applied. A Time Out was conducted at 10:34, prior to the start of the procedure. The procedure was tolerated well with a pain level of 0 throughout and a pain level of 0 following the procedure. Post procedure Diagnosis Wound #7: Same as Pre-Procedure . Plan Follow-up Appointments: Return Appointment in 1 week. Bathing/ Shower/ Hygiene: May shower with wound dressing protected with water repellent cover or cast protector. Cellular or Tissue Based Products: Cellular or Tissue  Based Product Type: - NuShield #7 Cellular or Tissue Based Product applied to wound bed; including contact layer, fixation with steri-strips, dry gauze and cover dressing. (DO NOT  REMOVE). Edema Control - Lymphedema / Segmental Compressive Device / Other: Optional: One layer of unna paste to top of compression wrap (to act as an anchor). - and at toes Tubigrip single layer applied. - size D to left lower leg to above the knee Elevate, Exercise Daily and Avoid Standing for Long Periods of Time. Elevate legs to the level of the heart and pump ankles as often as possible Elevate leg(s) parallel to the floor when sitting. WOUND #7: - Malleolus Wound Laterality: Right, Lateral Cleanser: Soap and Water 1 x Per Week/30 Days Discharge Instructions: Gently cleanse wound with antibacterial soap, rinse and pat dry prior to dressing wounds Secondary Dressing: Gauze 1 x Per Week/30 Days Discharge Instructions: As directed: dry, moistened with saline or moistened with Dakins Solution Compression Wrap: Profore Lite LF 3 Multilayer Compression Bandaging System 1 x Per Week/30 Days Discharge Instructions: Apply 3 multi-layer wrap as prescribed. 1. I am good recommend that we go ahead and continue with the wound care measures as before using the NuShield I did apply that today following the application secured with Steri-Strips and Adaptic she tolerated this without complication. 2. I am also can recommend that we have her continue with the compression wrap on this right side. That is doing awesome and again this is a 3 layer compression wrap. BREAH, JOA ([id number]) 3. I am also can recommend patient continue with the Tubigrip on the left leg as of next week she will probably be okay to go ahead and switch back to her own compression sock. We will see patient back for reevaluation in 1 week here in the clinic. If anything worsens or changes patient will contact our office for  additional recommendations. Electronic Signature(s) Signed: 11/24/2021 11:07:31 AM By: Worthy Keeler PA-C Entered By: Worthy Keeler on 11/24/2021 11:07:30 Schooley, [name] ([id number]) -------------------------------------------------------------------------------- SuperBill Details Patient Name: [name] Date of Service: 11/24/2021 Medical Record Number: [id number] Patient Account Number: [id number] Date of Birth/Sex: March 28, 1923 (87 y.o. F) Treating RN: Carlene Coria Primary Care Provider: Adrian Prows Other Clinician: Referring Provider: Adrian Prows Treating Provider/Extender: Skipper Cliche in Treatment: 27 Diagnosis Coding ICD-10 Codes Code Description [phone] Pressure ulcer of right ankle, stage 3 I89.0 Lymphedema, not elsewhere classified I87.2 Venous insufficiency (chronic) (peripheral) S80.12XA Contusion of left lower leg, initial encounter Z95.0 Presence of cardiac pacemaker Z79.01 Long term (current) use of anticoagulants Facility Procedures CPT4 Code: [id number] Description: S9373 NUSHIELD 1.6 SQ CM (CHG PER SQ CM) Modifier: Quantity: 2 CPT4 Code: 42876811 Description: 57262 - SKIN SUB GRAFT TRNK/ARM/LEG Modifier: Quantity: 1 CPT4 Code: Description: ICD-10 Diagnosis Description 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, stage 3 Modifier: Quantity: Physician Procedures CPT4 Code: [id number] Description: 16384 - WC PHYS SKIN SUB GRAFT TRNK/ARM/LEG Modifier: Quantity: 1 CPT4 Code: Description: ICD-10 Diagnosis Description 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, stage 3 Modifier: Quantity: Electronic Signature(s) Signed: 11/24/2021 11:09:51 AM By: Worthy Keeler PA-C Entered By: Worthy Keeler on 11/24/2021 11:09:51

## 2021-11-24 NOTE — Progress Notes (Signed)
ESTY, AHUJA ([id number]) Visit Report for 11/24/2021 Arrival Information Details Patient Name: [name], [name]. Date of Service: 11/24/2021 10:15 AM Medical Record Number: [id number] Patient Account Number: [id number] Date of Birth/Sex: 07-08-1923 (87 y.o. F) Treating RN: Carlene Coria Primary Care Alasha Mcguinness: Adrian Prows Other Clinician: Referring Nyashia Raney: Adrian Prows Treating Sojourner Behringer/Extender: Skipper Cliche in Treatment: 27 Visit Information History Since Last Visit All ordered tests and consults were completed: No Patient Arrived: [name] Added or deleted any medications: No Arrival Time: 10:12 Any new allergies or adverse reactions: No Accompanied By: daughter Had a fall or experienced change in No Transfer Assistance: None activities of daily living that may affect Patient Identification Verified: Yes risk of falls: Secondary Verification Process Completed: Yes Signs or symptoms of abuse/neglect since last visito No Patient Requires Transmission-Based No Hospitalized since last visit: No Precautions: Implantable device outside of the clinic excluding No Patient Has Alerts: Yes cellular tissue based products placed in the center Patient Alerts: Patient on Blood since last visit: Thinner Has Dressing in Place as Prescribed: Yes NOT DIABETIC Pain Present Now: No ***ELIQUIS*** Electronic Signature(s) Signed: 11/24/2021 11:33:18 AM By: Carlene Coria RN Entered By: Carlene Coria on 11/24/2021 10:17:59 Herder, [name] ([id number]) -------------------------------------------------------------------------------- Clinic Level of Care Assessment Details Patient Name: [name] Date of Service: 11/24/2021 10:15 AM Medical Record Number: [id number] Patient Account Number: [id number] Date of Birth/Sex: Mar 27, 1923 (87 y.o. F) Treating RN: Carlene Coria Primary Care Makala Fetterolf: Adrian Prows Other Clinician: Referring Renatha Rosen: Adrian Prows Treating Jelitza Manninen/Extender: Skipper Cliche in Treatment: 27 Clinic Level of Care Assessment Items TOOL 1 Quantity Score []  - Use when EandM and Procedure is performed on INITIAL visit 0 ASSESSMENTS - Nursing Assessment / Reassessment []  - General Physical Exam (combine w/ comprehensive assessment (listed just below) when performed on new 0 pt. evals) []  - 0 Comprehensive Assessment (HX, ROS, Risk Assessments, Wounds Hx, etc.) ASSESSMENTS - Wound and Skin Assessment / Reassessment []  - Dermatologic / Skin Assessment (not related to wound area) 0 ASSESSMENTS - Ostomy and/or Continence Assessment and Care []  - Incontinence Assessment and Management 0 []  - 0 Ostomy Care Assessment and Management (repouching, etc.) PROCESS - Coordination of Care []  - Simple Patient / Family Education for ongoing care 0 []  - 0 Complex (extensive) Patient / Family Education for ongoing care []  - 0 Staff obtains Programmer, systems, Records, Test Results / Process Orders []  - 0 Staff telephones HHA, Nursing Homes / Clarify orders / etc []  - 0 Routine Transfer to another Facility (non-emergent condition) []  - 0 Routine Hospital Admission (non-emergent condition) []  - 0 New Admissions / Biomedical engineer / Ordering NPWT, Apligraf, etc. []  - 0 Emergency Hospital Admission (emergent condition) PROCESS - Special Needs []  - Pediatric / Minor Patient Management 0 []  - 0 Isolation Patient Management []  - 0 Hearing / Language / Visual special needs []  - 0 Assessment of Community assistance (transportation, D/C planning, etc.) []  - 0 Additional assistance / Altered mentation []  - 0 Support Surface(s) Assessment (bed, cushion, seat, etc.) INTERVENTIONS - Miscellaneous []  - External ear exam 0 []  - 0 Patient Transfer (multiple staff / Civil Service fast streamer / Similar devices) []  - 0 Simple Staple / Suture removal (25 or less) []  - 0 Complex Staple / Suture removal (26 or more) []  - 0 Hypo/Hyperglycemic  Management (do not check if billed separately) []  - 0 Ankle / Brachial Index (ABI) - do not check if billed separately Has the patient been seen at the hospital within  the last three years: Yes Total Score: 0 Level Of Care: ____ [name] ([id number]) Electronic Signature(s) Signed: 11/24/2021 11:33:18 AM By: Carlene Coria RN Entered By: Carlene Coria on 11/24/2021 10:35:45 Cozby, [name] ([id number]) -------------------------------------------------------------------------------- Encounter Discharge Information Details Patient Name: [name] Date of Service: 11/24/2021 10:15 AM Medical Record Number: [id number] Patient Account Number: [id number] Date of Birth/Sex: 28-Nov-1923 (87 y.o. F) Treating RN: Carlene Coria Primary Care Avanna Sowder: Adrian Prows Other Clinician: Referring Alvia Tory: Adrian Prows Treating [name] [name]/Extender: Skipper Cliche in Treatment: 27 Encounter Discharge Information Items Post Procedure Vitals Discharge Condition: Stable Temperature (F): 98.3 Ambulatory Status: Ambulatory Pulse (bpm): 76 Discharge Destination: Home Respiratory Rate (breaths/min): 18 Transportation: Private Auto Blood Pressure (mmHg): 171/85 Accompanied By: self Schedule Follow-up Appointment: Yes Clinical Summary of Care: Patient Declined Electronic Signature(s) Signed: 11/24/2021 11:33:18 AM By: Carlene Coria RN Entered By: Carlene Coria on 11/24/2021 10:38:04 Hottel, [name] ([id number]) -------------------------------------------------------------------------------- Lower Extremity Assessment Details Patient Name: [name] Date of Service: 11/24/2021 10:15 AM Medical Record Number: [id number] Patient Account Number: [id number] Date of Birth/Sex: 1923/10/09 (87 y.o. F) Treating RN: Carlene Coria Primary Care Ricarda Atayde: Adrian Prows Other Clinician: Referring Therman Hughlett: Adrian Prows Treating Paticia Moster/Extender: Skipper Cliche  in Treatment: 27 Edema Assessment Assessed: [Left: No] [Right: No] Edema: [Left: Ye] [Right: s] Calf Left: Right: Point of Measurement: 33 cm From Medial Instep 26 cm Ankle Left: Right: Point of Measurement: 10 cm From Medial Instep 17 cm Vascular Assessment Pulses: Dorsalis Pedis Palpable: [Right:Yes] Electronic Signature(s) Signed: 11/24/2021 11:33:18 AM By: Carlene Coria RN Entered By: Carlene Coria on 11/24/2021 10:29:15 Fantini, [name] ([id number]) -------------------------------------------------------------------------------- Multi Wound Chart Details Patient Name: [name] Date of Service: 11/24/2021 10:15 AM Medical Record Number: [id number] Patient Account Number: [id number] Date of Birth/Sex: 1923/06/04 (87 y.o. F) Treating RN: Carlene Coria Primary Care Amyrie Illingworth: Adrian Prows Other Clinician: Referring Grecia Lynk: Adrian Prows Treating Renay Crammer/Extender: Skipper Cliche in Treatment: 27 Vital Signs Height(in): 23 Pulse(bpm): 43 Weight(lbs): 150 Blood Pressure(mmHg): 171/85 Body Mass Index(BMI): 27 Temperature(F): 98.3 Respiratory Rate(breaths/min): 18 Photos: [N/A:N/A] Wound Location: Right, Lateral Malleolus N/A N/A Wounding Event: Gradually Appeared N/A N/A Primary Etiology: Pressure Ulcer N/A N/A Comorbid History: Cataracts, Congestive Heart Failure, N/A N/A Hypertension, Peripheral Arterial Disease, Osteoarthritis, Neuropathy Date Acquired: 05/12/2021 N/A N/A Weeks of Treatment: 27 N/A N/A Wound Status: Open N/A N/A Measurements L x W x D (cm) 0.6x0.7x0.2 N/A N/A Area (cm) : 0.33 N/A N/A Volume (cm) : 0.066 N/A N/A % Reduction in Area: 70.60% N/A N/A % Reduction in Volume: 70.70% N/A N/A Classification: Category/Stage III N/A N/A Exudate Amount: Medium N/A N/A Exudate Type: Serosanguineous N/A N/A Exudate Color: red, brown N/A N/A Granulation Amount: Large (67-100%) N/A N/A Necrotic Amount: None Present (0%) N/A  N/A Exposed Structures: Fat Layer (Subcutaneous Tissue): N/A N/A Yes Fascia: No Tendon: No Muscle: No Joint: No Bone: No Epithelialization: None N/A N/A Treatment Notes Electronic Signature(s) Signed: 11/24/2021 11:33:18 AM By: Carlene Coria RN Entered By: Carlene Coria on 11/24/2021 10:32:31 Vanallen, [name] ([id number]) -------------------------------------------------------------------------------- [place] Details Patient Name: [name] Date of Service: 11/24/2021 10:15 AM Medical Record Number: [id number] Patient Account Number: [id number] Date of Birth/Sex: 07/16/1923 (87 y.o. F) Treating RN: Carlene Coria Primary Care Helma Argyle: Adrian Prows Other Clinician: Referring Cesiah Westley: Adrian Prows Treating Tamario Heal/Extender: Skipper Cliche in Treatment: 95 Active Inactive Electronic Signature(s) Signed: 11/24/2021 11:33:18 AM By: Carlene Coria RN Entered By: Carlene Coria on 11/24/2021 10:32:10 Dube, [name] ([id number]) -------------------------------------------------------------------------------- Pain  Assessment Details Patient Name: [name], [name]. Date of Service: 11/24/2021 10:15 AM Medical Record Number: [id number] Patient Account Number: [id number] Date of Birth/Sex: 1923/11/03 (87 y.o. F) Treating RN: Carlene Coria Primary Care Kerry Odonohue: Adrian Prows Other Clinician: Referring Cheria Sadiq: Adrian Prows Treating Jax Abdelrahman/Extender: Skipper Cliche in Treatment: 27 Active Problems Location of Pain Severity and Description of Pain Patient Has Paino No Site Locations Pain Management and Medication Current Pain Management: Electronic Signature(s) Signed: 11/24/2021 11:33:18 AM By: Carlene Coria RN Entered By: Carlene Coria on 11/24/2021 10:18:28 [name] ([id number]) -------------------------------------------------------------------------------- Patient/Caregiver Education Details Patient Name:  [name] Date of Service: 11/24/2021 10:15 AM Medical Record Number: [id number] Patient Account Number: [id number] Date of Birth/Gender: 04-Nov-1923 (87 y.o. F) Treating RN: Carlene Coria Primary Care Physician: Adrian Prows Other Clinician: Referring Physician: Adrian Prows Treating Physician/Extender: Skipper Cliche in Treatment: 109 Education Assessment Education Provided To: Patient Education Topics Provided Wound/Skin Impairment: Methods: Explain/Verbal Responses: State content correctly Electronic Signature(s) Signed: 11/24/2021 11:33:18 AM By: Carlene Coria RN Entered By: Carlene Coria on 11/24/2021 10:36:25 Fichter, [name] ([id number]) -------------------------------------------------------------------------------- Wound Assessment Details Patient Name: [name] Date of Service: 11/24/2021 10:15 AM Medical Record Number: [id number] Patient Account Number: [id number] Date of Birth/Sex: 1923-11-09 (87 y.o. F) Treating RN: Carlene Coria Primary Care Igor Bishop: Adrian Prows Other Clinician: Referring Zairah Arista: Adrian Prows Treating Hikeem Andersson/Extender: Skipper Cliche in Treatment: 27 Wound Status Wound Number: 7 Primary Pressure Ulcer Etiology: Wound Location: Right, Lateral Malleolus Wound Open Wounding Event: Gradually Appeared Status: Date Acquired: 05/12/2021 Comorbid Cataracts, Congestive Heart Failure, Hypertension, Weeks Of Treatment: 27 History: Peripheral Arterial Disease, Osteoarthritis, Neuropathy Clustered Wound: No Photos Wound Measurements Length: (cm) 0.6 Width: (cm) 0.7 Depth: (cm) 0.2 Area: (cm) 0.33 Volume: (cm) 0.066 % Reduction in Area: 70.6% % Reduction in Volume: 70.7% Epithelialization: None Wound Description Classification: Category/Stage III Exudate Amount: Medium Exudate Type: Serosanguineous Exudate Color: red, brown Foul Odor After Cleansing: No Slough/Fibrino No Wound Bed Granulation  Amount: Large (67-100%) Exposed Structure Necrotic Amount: None Present (0%) Fascia Exposed: No Fat Layer (Subcutaneous Tissue) Exposed: Yes Tendon Exposed: No Muscle Exposed: No Joint Exposed: No Bone Exposed: No Treatment Notes Wound #7 (Malleolus) Wound Laterality: Right, Lateral Cleanser Soap and Water Discharge Instruction: Gently cleanse wound with antibacterial soap, rinse and pat dry prior to dressing wounds Peri-Wound Care Gwendolynn, Merkey [name] ([id number]) Topical Primary Dressing Secondary Dressing Gauze Discharge Instruction: As directed: dry, moistened with saline or moistened with Dakins Solution Secured With Compression Wrap Profore Lite LF 3 Multilayer Compression Bandaging System Discharge Instruction: Apply 3 multi-layer wrap as prescribed. Compression Stockings Add-Ons Electronic Signature(s) Signed: 11/24/2021 11:33:18 AM By: Carlene Coria RN Entered By: Carlene Coria on 11/24/2021 10:28:19 Nicol, [name] ([id number]) -------------------------------------------------------------------------------- Vitals Details Patient Name: [name] Date of Service: 11/24/2021 10:15 AM Medical Record Number: [id number] Patient Account Number: [id number] Date of Birth/Sex: 05/26/1923 (87 y.o. F) Treating RN: Carlene Coria Primary Care Deloy Archey: Adrian Prows Other Clinician: Referring Isao Seltzer: Adrian Prows Treating Delyla Sandeen/Extender: Skipper Cliche in Treatment: 27 Vital Signs Time Taken: 10:18 Temperature (F): 98.3 Height (in): 62 Pulse (bpm): 76 Weight (lbs): 150 Respiratory Rate (breaths/min): 18 Body Mass Index (BMI): 27.4 Blood Pressure (mmHg): 171/85 Reference Range: 80 - 120 mg / dl Electronic Signature(s) Signed: 11/24/2021 11:33:18 AM By: Carlene Coria RN Entered By: Carlene Coria on 11/24/2021 10:18:17

## 2021-12-01 ENCOUNTER — Ambulatory Visit: Payer: Medicare Other | Admitting: Physician Assistant

## 2021-12-02 ENCOUNTER — Other Ambulatory Visit: Payer: Self-pay

## 2021-12-02 ENCOUNTER — Encounter: Payer: Medicare Other | Admitting: Internal Medicine

## 2021-12-02 DIAGNOSIS — L89513 Pressure ulcer of right ankle, stage 3: Secondary | ICD-10-CM | POA: Diagnosis not present

## 2021-12-02 NOTE — Progress Notes (Addendum)
[name], [name] ([id number]) Visit Report for 12/02/2021 Chief Complaint Document Details Patient Name: [name], [name]. Date of Service: 12/02/2021 10:15 AM Medical Record Number: [id number] Patient Account Number: [id number] Date of Birth/Sex: 1923/10/20 (87 y.o. F) Treating RN: [name] Primary Care Provider: Adrian [name] Other Clinician: Referring Provider: Adrian [name] Treating Provider/Extender: [name] in Treatment: 70 Information Obtained from: Patient Chief Complaint Right foot ulcer Electronic Signature(s) Signed: 12/02/2021 4:08:28 PM By: [name] Ham MD Entered By: [name] on 12/02/2021 10:28:49 [name] ([id number]) -------------------------------------------------------------------------------- Cellular or Tissue Based Product Details Patient Name: [name] Date of Service: 12/02/2021 10:15 AM Medical Record Number: [id number] Patient Account Number: [id number] Date of Birth/Sex: March 11, 1923 (87 y.o. F) Treating RN: [name] Primary Care Provider: Adrian [name] Other Clinician: Referring Provider: Adrian [name] Treating Provider/Extender: [name] in Treatment: 95 Cellular or Tissue Based Product Type Wound #7 Right,Lateral Malleolus Applied to: Performed By: Physician [name] Dillon, MD Cellular or Tissue Based Product [name] Type: Level of Consciousness (Pre- Awake and Alert procedure): Pre-procedure Verification/Time Out Yes - 10:30 Taken: Location: trunk / arms / legs Wound Size (sq cm): 0.42 Product Size (sq cm): 1.6 Waste Size (sq cm): 0 Amount of Product Applied (sq cm): 1.6 Instrument Used: Forceps Lot #: [id number] Order #: 8 Expiration Date: 06/23/2026 Fenestrated: No Reconstituted: Yes Solution Type: normal saline Solution Amount: 10 ml Lot #: [id number] Solution Expiration Date: 05/05/2023 Secured: Yes Secured With: Steri-Strips Dressing Applied:  Yes Primary Dressing: adaptic Procedural Pain: 0 Post Procedural Pain: 0 Response to Treatment: Procedure was tolerated well Level of Consciousness (Post- Awake and Alert procedure): Post Procedure Diagnosis Same as Pre-procedure Electronic Signature(s) Signed: 12/02/2021 4:08:28 PM By: [name] Ham MD Entered By: [name] on 12/02/2021 10:40:59 [name], [name] ([id number]) -------------------------------------------------------------------------------- HPI Details Patient Name: [name] Date of Service: 12/02/2021 10:15 AM Medical Record Number: [id number] Patient Account Number: [id number] Date of Birth/Sex: 23-Jan-1923 (87 y.o. 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 Dr. Amalia [name] the podiatrist saw her on 07/20/2017 and sent her to the wound center for possible hyperbaric oxygen therapy. past medical history of peripheral vascular disease, varicose veins, status post appendectomy, basal cell carcinoma excision from the left leg, cholecystectomy, pacemaker placement, right lower extremity angiography done by [name] in March 2017 with placement of a stent. there is also note of a successful ablation of the right small saphenous vein done which was reviewed by ultrasound on 10/24/2016. the patient had a right small saphenous vein ablation done on 10/20/2016. The patient has never been a smoker. She has been seen by Dr. Corene Cornea [name] the vascular surgeon who most recently saw her on 06/15/2017 for evaluation of ongoing problems with right leg swelling. She had a lower extremity  arterial duplex examination done(02/13/17) which showed patent distal right superficial femoral artery stent and above-the-knee popliteal stent without evidence of restenosis. The ABI was more than 1.3 on the right and more than 1.3 on the left. This was consistent with noncompressible arteries due to medial calcification. The right great toe pressure and PPG waveforms are within normal limits and the left great toe pressure and PPG waveforms are  decreased. he recommended she continue to wear her compression stockings and continue with elevation. She is scheduled to have a noninvasive arterial study in the near future 08/16/2017 -- had a lower extremity arterial duplex examination done which showed patent distal right superficial femoral artery stent and above-the-knee popliteal stent without evidence of restenosis. The ABI was more than 1.3 on the right and more than 1.3 on the left. This was consistent with noncompressible arteries due to medial calcification. The right great toe pressure and PPG waveforms are within normal limits and the left great toe pressure and PPG waveforms are decreased. the x-ray of the right ankle has not yet been done 08/24/2017 -- had a right ankle x-ray -- IMPRESSION:1. No fracture, bone lesion or evidence of osteomyelitis. 2. Lateral soft tissue swelling with a soft tissue ulcer. she has not yet seen the vascular surgeon for review 08/31/17 on evaluation today patient's wound appears to be showing signs of improvement. She still with her appointment with vascular in order to review her results of her vascular study and then determine if any intervention would be recommended at that time. No fevers, chills, nausea, or vomiting noted at this time. She has been tolerating the dressing changes without complication. 09/28/17 on evaluation today patient's wound appears to show signs of good improvement in regard to the granulation tissue which is surfacing. There is still a  layer of slough covering the wound and the posterior portion is still significantly deeper than the anterior nonetheless there has been some good sign of things moving towards the better. She is going to go back to [name] for reevaluation to ensure her blood flow is still appropriate. That will be before her next evaluation with [place] next week. No fevers, chills, nausea, or vomiting noted at this time. Patient does have some discomfort rated to be a 3-4/10 depending on activity specifically cleansing the wound makes it worse. 10/05/2017 -- the patient was seen by Dr. Lucky Cowboy last week and noninvasive studies showed a normal right ABI with brisk triphasic waveforms consistent with no arterial insufficiency including normal digital pressures. The duplex showed a patent distal right SFA stent and the proximal SFA was also normal. He was pleased with her test and thought she should have enough of perfusion for normal wound healing. He would see her back in 6 months time. 12/21/17 on evaluation today patient appears to be doing fairly well in regard to her right lateral ankle wound. Unfortunately the main issue that she is expansion at this point is that she is having some issues with what appears to be some cellulitis in the right anterior shin. She has also been noting a little bit of uncomfortable feeling especially last night and her ankle area. I'm afraid that she made the developing a little bit of an infection. With that being said I think it is in the early stages. 12/28/17 on evaluation today patient's ankle appears to be doing excellent. She's making good progress at this point the cellulitis seems to have improved after last week's evaluation. Overall she is having no significant discomfort which is excellent news. She does have an appointment with [name] on March 29, 2018 for reevaluation in regard to the stent he placed. She seems to have excellent blood flow in the right lower extremity. 01/19/12 on  evaluation today patient's wound appears to be doing very well. In fact she does not appear to require debridement at this point, there's no evidence of infection, and overall  from the standpoint of the wound she seems to be doing very well. With that being said I believe that it may be time to switch to different dressing away from the [facility] Dressing she tells me she does have a lot going on her friend actually passed away yesterday and she's also having a lot of issues with her husband this obviously is weighing heavy on her as far as your thoughts and concerns today. 01/25/18 on evaluation today patient appears to be doing fairly well in regard to her right lateral malleolus. She has been tolerating the dressing changes without complication. Overall I feel like this is definitely showing signs of improvement as far as [name] the overall appearance of the wound is there's also evidence of epithelium start to migrate over the granulation tissue. In general I think that she is progressing nicely as far as the wound is concerned. The only concern she really has is whether or not we can switch to every other week visits in order to avoid having as many appointments as her daughters have a difficult time getting her to her appointments as well as the patient's husband to his he is not doing very well at this point. 02/22/18 on evaluation today patient's right lateral malleolus ulcer appears to be doing great. She has been tolerating the dressing changes without complication. Overall you making excellent progress at this time. Patient is having no significant discomfort. AAIRAH, NEGRETTE ([id number]) 03/15/18 on evaluation today patient appears to be doing much more poorly in regard to her right lateral ankle ulcer at this point. Unfortunately since have last seen her her husband has passed just a few days ago is obviously weighed heavily on her her daughter also had surgery well she is with her  today as usual. There does not appear to be any evidence of infection she does seem to have significant contusion/deep tissue injury to the right lateral malleolus which was not noted previous when I saw her last. It's hard to tell of exactly when this injury occurred although during the time she was spending the night in the hospital this may have been most likely. 03/22/18 on evaluation today patient appears to actually be doing very well in regard to her ulcer. She did unfortunately have a setback which was noted last week however the good news is we seem to be getting back on track and in fact the wound in the core did still have some necrotic tissue which will be addressed at this point today but in general I'm seeing signs that things are on the up and up. She is glad to hear this obviously she's been somewhat concerned that due to the [name] her wound digressed more recently. 03/29/18 on evaluation today patient appears to be doing fairly well in regard to her right lower extremity lateral malleolus ulcer. She unfortunately does have a new area of pressure injury over the inferior portion where the wound has opened up a little bit larger secondary to the pressure she seems to be getting. She does tell me sometimes when she sleeps at night that it actually hurts and does seem to be pushing on the area little bit more unfortunately. There does not appear to be any evidence of infection which is good news. She has been tolerating the dressing changes without complication. She also did have some bruising in the left second and third toes due to the fact that she may have bump this or injured it although she  has neuropathy so she does not feel she did move recently that may have been where this came from. Nonetheless there does not appear to be any evidence of infection at this time. 04/12/18 on evaluation today patient's wound on the right lateral ankle actually appears to be doing a little bit better  with a lot of necrotic docking tissue centrally loosening up in clearing away. However she does have the beginnings of a deep tissue injury on the left lateral malleolus likely due to the fact we've been trying offload the right as much as we have. I think she may benefit from an assistive soft device to help with offloading and it looks like they're looking at one of the doughnut conditions that wraps around the lower leg to offload which I think will definitely do a good job. With that being said I think we definitely need to address this issue on the left before it becomes a wound. Patient is not having significant pain. 04/19/18 on evaluation today patient appears to be doing excellent in regard to the progress she's made with her right lateral ankle ulcer. The left ankle region which did show evidence of a deep tissue injury seems to be resolving there's little fluid noted underneath and a blister there's nothing open at this point in time overall I feel like this is progressing nicely which is good news. She does not seem to be having significant discomfort at this point which is also good news. 04/25/18-She is here in follow up evaluation for bilateral lateral malleolar ulcers. The right lateral malleolus ulcer with pale subcutaneous tissue exposure, central area of ulcer with tendon/periosteum exposed. The left lateral malleolus ulcer now with central area of nonviable tissue, otherwise deep tissue injury. She is wearing compression wraps to the left lower extremity, she will place the right lower extremity compression wraps on when she gets home. She will be out of town over the weekend and return next week and follow-up appointment. She completed her doxycycline this morning 05/03/18 on evaluation today patient appears to be doing very well in regard to her right lateral ankle ulcer in general. At least she's showing some signs of improvement in this regard. Unfortunately she has some  additional injury to the left lateral malleolus region which appears to be new likely even over the past several days. Again this determination is based on the overall appearance. With that being said the patient is obviously frustrated about this currently. 05/10/18-She is here in follow-up evaluation for bilateral lateral malleolar ulcers. She states she has purchased offloading shoes/boots and they will arrive tomorrow. She was asked to bring them in the office at next week's appointment so her provider is aware of product being utilized. She continues to sleep on right or left side, she has been encouraged to sleep on her back. The right lateral malleolus ulcer is precariously close to peri-osteum; will order xray. The left lateral malleolus ulcer is improved. Will switch back to santyl; she will follow up next week. 05/17/18 on evaluation today patient actually appears to be doing very well in regard to her malleolus her ulcers compared to last time I saw them. She does not seem to have as much in the way of contusion at this point which is great news. With that being said she does continue to have discomfort and I do believe that she is still continuing to benefit from the offloading/pressure reducing boots that were recommended. I think this is the key to trying  to get this to heal up completely. 05/24/18 on evaluation today patient actually appears to be doing worse at this point in time unfortunately compared to her last week's evaluation. She is having really no increased pain which is good news unfortunately she does have more maceration in your theme and noted surrounding the right lateral ankle the left lateral ankle is not really is erythematous I do not see signs of the overt cellulitis on that side. Unfortunately the wounds do not seem to have shown any signs of improvement since the last evaluation. She also has significant swelling especially on the right compared to previous some of  this may be due to infection however also think that she may be served better while she has these wounds by compression wrapping versus continuing to use the Juxta-Lite for the time being. Especially with the amount of drainage that she is experiencing at this point. No fevers, chills, nausea, or vomiting noted at this time. 05/31/18 on evaluation today patient appears to actually be doing better in regard to her right lateral lower extremity ulcer specifically on the malleolus region. She has been tolerating the antibiotic without complication. With that being said she still continues to have issues but a little bit of redness although nothing like she what she was experiencing previous. She still continues to pressure to her ankle area she did get the problem on offloading boots unfortunately she will not wear them she states there too uncomfortable and she can't get in and out of the bed. Nonetheless at this point her wounds seem to be continually getting worse which is not what we want I'm getting somewhat concerned about her progress and [name] things are going to proceed if we do not intervene in some way shape or form. I therefore had a very lengthy conversation today about offloading yet again and even made a specific suggestion for switching her to a memory foam mattress and even gave the information for a specific one that they could look at getting if it was something that they were interested in considering. She does not want to be considered for a hospital bed air mattress although honestly insurance would not cover it that she does not have any wounds on her trunk. 06/14/18 on evaluation today both wounds over the bilateral lateral malleolus her ulcers appear to be doing better there's no evidence of pressure injury at this point. She did get the foam mattress for her bed and this does seem to have been extremely beneficial for her in my pinion. Her daughter states that she is having  difficulty getting out of bed because of [name] soft it is. The patient also relates this to be. Nonetheless I do feel like she's actually doing better. Unfortunately right after and around the time she was getting the mattress she also sustained a fall when she got up to go pick up the phone and ended up injuring her right elbow she has 18 sutures in place. We are not caring for this currently although home health is going to be taking the sutures out shortly. Nonetheless this may be something that we need to evaluate going forward. It depends on [name] well it has or has not healed in the end. She also recently saw an orthopedic specialist for an injection in the right shoulder just before her fall unfortunately the fall seems to have worsened her pain. 06/21/18 on evaluation today patient appears to be doing about the same in regard to her lateral  malleolus ulcers. Both appear to be just a little bit deeper but again we are clinging away the necrotic and dead tissue which I think is why this is progressing towards a deeper realm as opposed [name], [name]. ([id number]) to improving from my measurement standpoint in that regard. Nonetheless she has been tolerating the dressing changes she absolutely hates the memory foam mattress topper that was obtained for her nonetheless I do believe this is still doing excellent as far as taking care of excess pressure in regard to the lateral malleolus regions. She in fact has no pressure injury that I see whereas in weeks past it was week by week I was constantly seeing new pressure injuries. Overall I think it has been very beneficial for her. 07/03/18; patient arrives in my clinic today. She has deep punched out areas over her bilateral lateral malleoli. The area on the right has some more depth. We spent a lot of time today talking about pressure relief for these areas. This started when her daughter asked for a prescription for a memory foam mattress. I have  never written a prescription for a mattress and I don't think insurances would pay for that on an ordinary bed. In any case he came up that she has foam boots that she refuses to wear. I would suggest going to these before any other offloading issues when she is in bed. They say she is meticulous about offloading this the rest of the day 07/10/18- She is seen in follow-up evaluation for bilateral, lateral malleolus ulcers. There is no improvement in the ulcers. She has purchased and is sleeping on a memory foam mattress/overlay, she has been using the offloading boots nightly over the past week. She has a follow up appointment with vascular medicine at the end of October, in my opinion this follow up should be expedited given her deterioration and suboptimal TBI results. We will order plain film xray of the left ankle as deeper structures are palpable; would consider having MRI, regardless of xray report(s). The ulcers will be treated with iodoflex/iodosorb, she is unable to safely change the dressings daily with santyl. 07/19/18 on evaluation today patient appears to be doing in general visually well in regard to her bilateral lateral malleolus ulcers. She has been tolerating the dressing changes without complication which is good news. With that being said we did have an x-ray performed on 07/12/18 which revealed a slight loosen see in the lateral portion of the distal left fibula which may represent artifact but underline lytic destruction or osteomyelitis could not be excluded. MRI was recommended. With that being said we can see about getting the patient scheduled for an MRI to further evaluate this area. In fact we have that scheduled currently for August 20 19,019. 07/26/18 on evaluation today patient's wound on the right lateral ankle actually appears to be doing fairly well at this point in my pinion. She has made some good progress currently. With that being said unfortunately in regard to the left  lateral ankle ulcer this seems to be a little bit more problematic at this time. In fact as I further evaluated the situation she actually had bone exposed which is the first time that's been the case in the bone appear to be necrotic. Currently I did review patient's note from Dr. Bunnie Domino office with [facility]. He stated that ABI was 1.26 on the right and 0.95 on the left with good waveforms. Her perfusion is stable not reduced  from previous studies and her digital waveforms were pretty good particularly on the right. His conclusion upon review of the note was that there was not much she could do to improve her perfusion and he felt she was adequate for wound healing. His suggestion was that she continued to see [place] and consider a synthetic skin graft if there was no underlying infection. He plans to see her back in six months or as needed. 08/01/18 on evaluation today patient appears to be doing better in regard to her right lateral ankle ulcer. Her left lateral ankle ulcer is about the same she still has bone involvement in evidence of necrosis. There does not appear to be evidence of infection at this time On the right lateral lower extremity. I have started her on the Augmentin she picked this up and started this yesterday. This is to get her through until she sees infectious disease which is scheduled for 08/12/18. 08/06/18 on evaluation today patient appears to be doing rather well considering my discussion with patient's daughter at the end of last week. The area which was marked where she had erythema seems to be improved and this is good news. With that being said overall the patient seems to be making good improvement when it comes to the overall appearance of the right lateral ankle ulcer although this has been slow she at least is coming around in this regard. Unfortunately in regard to the left lateral ankle ulcer this is osteomyelitis based on the pathology report as  well is bone culture. Nonetheless we are still waiting CT scan. Unfortunately the MRI we originally ordered cannot be performed as the patient is a pacemaker which I had overlooked. Nonetheless we are working on the CT scan approval and scheduling as of now. She did go to the hospital over the weekend and was placed on IV Cefzo for a couple of days. Fortunately this seems to have improved the erythema quite significantly which is good news. There does not appear to be any evidence of worsening infection at this time. She did have some bleeding after the last debridement therefore I did not perform any sharp debridement in regard to left lateral ankle at this point. Patient has been approved for a snap vac for the right lateral ankle. 08/14/18; the patient with wounds over her bilateral lateral malleoli. The area on the right actually looks quite good. Been using a snap back on this area. Healthy granulation and appears to be filling in. Unfortunately the area on the left is really problematic. 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 Also noted to have cellulitis. She saw Dr. Novella Olive of infectious disease today and was put on linezolid. We are able to verify this with her pharmacy. She is completed the Augmentin that she was already on. We've been using Iodoflex to this area 08/23/18 on evaluation today patient's wounds both actually appear to be doing better compared to my prior evaluations. Fortunately she showing signs of good improvement in regard to the overall wound status especially where were using the snap vac on the right. In regard to left lateral malleolus the wound bed actually appears to be much cleaner than previously noted. I do not feel any phone directly probed during evaluation today and though there is tendon noted this does not appear to be necrotic it's actually fairly good as far as the overall  appearance of the tendon is  concerned. In general the wound bed actually appears to be doing significantly better than it was previous. Patient is currently in the care of [name] and I did review that note today. He actually has her on two weeks of linezolid and then following the patient will be on 1-2 months of Keflex. That is the plan currently. She has been on antibiotics therapy as prescribed by myself initially starting on July 30, 2018 and has been on that continuously up to this point. 08/30/18 on evaluation today patient actually appears to be doing much better in regard to her right lateral malleolus ulcer. She has been tolerating the dressing changes specifically the snap vac without complication although she did have some issues with the seal currently. Apparently there was some trouble with getting it to maintain over the past week past Sunday. Nonetheless overall the wound appears better in regard to the right lateral malleolus region. In regard to left lateral malleolus this actually show some signs of additional granulation although there still tendon noted in the base of the wound this appears to be healthy not necrotic in any way whatsoever. We are considering potentially using a snap vac for the left lateral malleolus as well the product wrap from [employer], [place], was present in the clinic today we're going to see this patient I did have her come in with me after obtaining consent from the patient and her daughter in order to look at the wound and see if there's any recommendation one way or another as to whether or not they felt the snapback could be beneficial for the left lateral malleolus region. But the conclusion was that it might be but that this is definitely a little bit deeper wound than what traditionally would be utilized for a snap vac. 09/06/18 on evaluation today patient actually appears to be doing excellent in my pinion in regard to both ankle ulcers. She has been  tolerating the dressing changes without complication which is great news. Specifically we have been using the snap vac. In regard to the right ankle I'm not even sure that this is going to be necessary for today and following as the wound has filled in quite nicely. In regard to the left ankle I do believe VONETTE, GROSSO ([id number]) that we're seeing excellent epithelialization from the edge as well as granulation in the central portion the tendon is still exposed but there's no evidence of necrotic bone and in general I feel like the patient has made excellent progress even compared to last week with just one week of the snap vac. 09/11/18; this is a patient who has wounds on her bilateral lateral malleoli. Initially both of these were deep stage IV wounds in the setting of chronic arterial insufficiency. She has been revascularized. As I understand think she been using snap vacs to both of these wounds however the area on the right became more superficial and currently she is only using it on the left. Using silver collagen on the right and silver collagen under the back on the left I believe 09/19/18 on evaluation today patient actually appears to be doing very well in regard to her lateral malleolus or ulcers bilaterally. She has been tolerating the dressing changes without complication. Fortunately there does not appear to be any evidence of infection at this time. Overall I feel like she is improving in an excellent manner and I'm very pleased with the fact that everything seems to be turning towards the better for her.  This has obviously been a long road. 09/27/18 on evaluation today patient actually appears to be doing very well in regard to her bilateral lateral malleolus ulcers. She has been tolerating the dressing changes without complication. Fortunately there does not appear to be any evidence of infection at this time which is also great news. No fevers, chills, nausea, or vomiting  noted at this time. Overall I feel like she is doing excellent with the snap vac on the left malleolus. She had 40 mL of fluid collection over the past week. 10/04/18 on evaluation today patient actually appears to be doing well in regard to her bilateral lateral malleolus ulcers. She continues to tolerate the dressing changes without complication. One issue that I see is the snap vac on the left lateral malleolus which appears to have sealed off some fluid underlying this area and has not really allowed it to heal to the degree that I would like to see. For that reason I did suggest at this point we may want to pack a small piece of packing strip into this region to allow it to more effectively wick out fluid. 10/11/18 in general the patient today does not feel that she has been doing very well. She's been a little bit lethargic and subsequently is having bodyaches as well according to what she tells me today. With that being said overall she has been concerned with the fact that something may be worsening although to be honest her wounds really have not been appearing poorly. She does have a new ulcer on her left heel unfortunately. This may be pressure related. Nonetheless it seems to me to have potentially started at least as a blister I do not see any evidence of deep tissue injury. In regard to the left ankle the snap vac still seems to be causing the ceiling off of the deeper part of the wound which is in turn trapping fluid. I'm not extremely pleased with the overall appearance as far as progress from last week to this week therefore I'm gonna discontinue the snap vac at this point. 10/18/18 patient unfortunately this point has not been feeling well for the past several days. She was seen by Grayland Ormond her primary care provider who is a Librarian, academic at [facility]. Subsequently she states that she's been very weak and generally feeling malaise. No fevers, chills, nausea, or  vomiting noted at this time. With that being said bloodwork was performed at the PCP office on the 11th of this month which showed a white blood cell count of 10.7. This was repeated today and shows a white blood cell count of 12.4. This does show signs of worsening. Coupled with the fact that she is feeling worse and that her left ankle wound is not really showing signs of improvement I feel like this is an indication that the osteomyelitis is likely exacerbating not improving. Overall I think we may also want to check her C-reactive protein and sedimentation rate. Actually did call [facility] office this afternoon while the patient was in the office here with me. Subsequently based on the findings we discussed treatment possibilities and I think that it is appropriate for [place] to go ahead and initiate treatment with doxycycline which I'm going to do. Subsequently he did agree to see about adding a CRP and sedimentation rate to her orders. If that has not already been drawn to where they can run it they will contact the patient she can come back to have  that check. They are in agreement with plan as far as the patient and her daughter are concerned. Nonetheless also think we need to get in touch with Dr. Henreitta Leber office to see about getting the patient scheduled with him as soon as possible. 11/08/18 on evaluation today patient presents for follow-up concerning her bilateral foot and ankle ulcers. I did do an extensive review of her chart in epic today. Subsequently she was seen by [name] he did initiate Cefepime IV antibiotic therapy. Subsequently she had some issues with her PICC line this had to be removed because it was coiled and then replaced. Fortunately that was now settled. Unfortunately she has continued have issues with her left heel as well as the issues that she is experiencing with her bilateral lateral malleolus regions. I do believe however both areas seem to be doing a little bit  better on evaluation today which is good news. No fevers, chills, nausea, or vomiting noted at this time. She actually has an angiogram schedule with [name] on this coming Monday, November 11, 2018. Subsequently the patient states that she is feeling much better especially than what she was roughly 2 weeks ago. She actually had to cancel an appointment because she was feeling so poorly. No fevers, chills, nausea, or vomiting noted at this time. 11/15/18 on evaluation today patient actually is status post having had her angiogram with [name] Monday, four days ago. 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 He had to go and work on several areas of the vasculature fortunately he was able to obtain no more than a 30% residual stenosis throughout post procedure. I reviewed this note today. I think this will definitely help with healing at this time. Fortunately there does not appear to be any signs of infection and I do feel like ratio already has a better appearance to it. 11/22/18 upon evaluation today patient actually appears to be doing very well in regard to her wounds in general. The right lateral malleolus looks excellent the heel looks better in the left lateral malleolus also appears to be doing a little better. With that being said the right second toe actually appears to be open and training we been watching this is been dry and stable but now is open. 12/03/2018 Seen today for follow-up and management of multiple bilateral lower extremity wounds. New pressure injury of the great toe which is closed at this time. Wound of the right distal second toe appears larger today with deep undermining and a pocket of fluid present within the undermining region. Left and right malleolus is wounds are stable today with no signs and symptoms of infection.Denies any needs or concerns during exam today. 12/13/18 on evaluation today patient appears to be doing somewhat better in  regard to her left heel ulcer. She also seems to be completely healed in regard to the right lateral malleolus ulcer. The left malleolus ulcer is smaller what unfortunately the wounds which are new over the first and second toes of the right foot are what are most concerning at this point especially the second. Both areas did require sharp debridement today. 12/20/18 on evaluation today patient's wound actually appears to be doing better in regard to left lateral ankle and her right lateral ankle continues to remain healed. The hill ulcer on the left is improved. She does have improvement noted as well in regard to both toe ulcers. Overall I'm very pleased in this regard.  No fevers, chills, nausea, or vomiting noted at this time. 12/23/18 on evaluation today patient is seen after she had her toenails trimmed at the podiatrist office due to issues with her right great toe. There was what appeared to be dark eschar on the surface of the wound which had her in the podiatrist concerned. Nonetheless as I remember that during the last office visit I had utilize silver nitrate of this area I was much less concerned about the situation. Subsequently I was able to clean off much of this tissue without any complication today. This does not appear to show any signs of infection and actually look somewhat better Maret, [name] ([id number]) compared to last time post debridement. Her second toe on the right foot actually had callous over and there did appear still be some fluid underneath this that would require debridement today. 12/27/18 on evaluation today patient actually appears to be showing signs of improvement at all locations. Even the left lateral ankle although this is not quite as great as the other sites. Fortunately there does not appear to be any signs of infection at this time and both of her toes on the right foot seem to be showing signs of improvement which is good news and very pleased in this  regard. 01/03/19 on evaluation today patient appears to be doing better for the most part in regard to her wounds in particular. There does not appear to be any evidence of infection at this time which is good news. Fortunately there is no sign of really worsening anywhere except for the right great toe which she does have what appears to be a bruise/deep tissue injury which is very superficial and already resolving. I'm not sure where this came from I questioned her extensively and she does not recall what may have happened with this. Other than that the patient seems to be doing well even the left lateral ankle ulcer looks good and is getting smaller. 01/10/19 on evaluation today patient appears to be doing well in regard to her left heel wound and both of her toe wounds. Overall I feel like there is definitely improvement here and I'm happy in that regard. With that being said unfortunately she is having issues with the left lateral malleolus ulcer which unfortunately still has a lot of depth to it. This is gonna be a very difficult wound for [place] to be able to truly get to heal. I may want to consider some type of skin substitute to see if this would be of benefit for her. I'll discuss this with her more the next visit most likely. This was something I thought about more at the end of the visit when I was Artie out of the room and the patient had been discharged. 01/17/19 on evaluation today patient appears to be doing very well in regard to her wounds in general. She's been making excellent progress at this time. Fortunately there's no sign of infection at this time either. No fevers, chills, nausea, or vomiting noted at this time. The biggest issue is still her left lateral malleolus where it appears to be doing well and is getting smaller but still shows a small corner where this is deeper and goes down into what appears to be the joint space. Nonetheless this is taking much longer to heal although it  still looks better in smaller than previous evaluations. 01/24/19 on evaluation today patient's wounds actually appear to be doing rather well in general overall. She did  require some sharp debridement in regard to the right great toe but everything else appears to be doing excellent no debridement was even necessary. No fevers, chills, nausea, or vomiting noted at this time. 01/31/19 on evaluation today patient actually appears to be doing much better in regard to her left foot wound on the heel as well as the ankle. The right great toe appears to be a little bit worse today this had callous over and trapped a lot of fluid underneath. Fortunately there's no signs of infection at any site which is great news. 02/07/19 on evaluation today patient actually appears to be doing decently well in regard to all of her ulcers at this point. No sharp debridement was required she is a little bit of hyper granulation in regard to the left lateral ankle as well as the left heel but the hill itself is almost completely healed which is excellent news. Overall been very pleased in this regard. 02/14/19 on evaluation today patient actually appears to be doing very well in regard to her ulcers on the right first toe, left lateral malleolus, and left heel. In fact the heel is almost completely healed at this point. The patient does not show any signs of infection which is good news. Overall very pleased with [name] things have progressed. 04/18/19 Telehealth Evaluation During the COVID-19 National Emergency: Verbal Consent: Obtained from patient Allergies: reviewed and the active list is current. Medication changes: patient has no current medication changes. COVID-19 Screening: 1. Have you traveled internationally or on a cruise ship in the last 14 dayso No 2. Have you had contact with someone with or under investigation for COVID-19o No 3. Have you had a fever, cough, sore throat, or experiencing shortness of breatho  No on evaluation today actually did have a visit with this patient through a telehealth encounter with her home health nurse. Subsequently it was noted that the patient actually appears to be doing okay in regard to her wounds both the right great toe as well as the left lateral malleolus have shown signs of improvement although this in your theme around the left lateral malleolus there eschar coverings for both locations. The question is whether or not they are actually close and whether or not home health needs to discharge the patient or not. Nonetheless my concern is this point obviously is that without actually seeing her and being able to evaluate this directly I cannot ensure that she is completely healed which is the question that I'm being asked. 04/22/19 on evaluation today patient presents for her first evaluation since last time I saw her which was actually February 14, 2019. I did do a telehealth visit last week in which point it was questionable whether or not she may be healed and had to bring her in today for confirmation. With that being said she does seem to be doing quite well at this point which is good news. There does not appear to be any drainage in the deed I believe her wounds may be healed. Readmission: 09/04/2019 on evaluation today patient appears to be doing unfortunately somewhat more poorly in regard to her left foot ulcer secondary to a wound that began on 08/21/2019 at least when she first noticed this. Fortunately she has not had any evidence of active infection at this time. Systemically. I also do not necessarily see any evidence of infection at the blister/wound site on the first metatarsal head plantar aspect. This almost appears to be something that may have  just rubbed inappropriately causing this to breakdown. They did not want a wait too long to come in to be seen as again she had significant issues in the past with wounds that took quite a while to heal in fact it  was close to 2 years. Nonetheless this does not appear to be quite that bad but again we do need to remove some of the necrotic tissue from the surface of the wound to tell exactly the extent. She does not appear to have any significant arterial disease at this point and again her last ABIs and TBI's are recorded above in the alert section her left ABI was 1.27 with a TBI of 0.72 to the right ABI 1.08 with a TBI of 0.39. Other than this the patient has been doing quite well since I last saw her and that was in May 2020. 09/11/2019 on evaluation today patient appeared to be doing very well with regard to her plantar foot ulcer on the left. In fact this appears to be almost completely healed which is awesome. That is after just 1 week of intervention. With that being said there is no signs of active infection at this time. ALISCIA, CLAYTON ([id number]) 09/18/2019 on evaluation today patient actually appears to be doing excellent in fact she is completely healed based on what I am seeing at this point. Fortunately there is no signs of active infection at this time and overall patient is very pleased to hear that this area has healed so quickly. Readmission: 05/13/2021 upon evaluation today this patient presents for reevaluation here in the clinic. This is a wound that actually we previously took care of. She had 1 on the right ankle and the left the left turned out to be be harder due to to heal but nonetheless is doing great at this point as the right that has reopened and it was noted first just several weeks ago with a scab over it and came off in just the past few days. Fortunately there does not appear to be any obvious evidence of significant active infection at this time which is great news. No fevers, chills, nausea, vomiting, or diarrhea. 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 There does not appear to be any signs of this interfering in any  way with her wound. She does have swelling we previously had compression socks for her ordered but again it does not look like she wears these on a regular basis by any means. 05/26/2021 upon evaluation today patient appears to be doing well with regard to her wound which is actually showing signs of excellent improvement. There does not appear to be any signs of active infection which is great news and overall very pleased with where things stand today. No fevers, chills, nausea, vomiting, or diarrhea. 06/02/2021 upon evaluation today patient's wound actually showing signs of excellent improvement. Fortunately there does not appear to be any signs of active infection which is great news. I think the patient is making good progress with regard to her wounds in general. 06/09/2021 upon evaluation today patient appears to be doing excellent in regard to her wounds currently. Fortunately there does not appear to be any signs of active infection which is great news. No fevers, chills, nausea, vomiting, or diarrhea. Overall extremely pleased with where things stand today. I think the patient is making excellent progress. 06/16/2021 upon evaluation today patient appears to be doing well in regard to  her wound. This is going require little bit of debridement today and that was discussed with the patient. Otherwise she seems to be doing quite well and I am actually very pleased with where things stand at this point. No fevers, chills, nausea, vomiting, or diarrhea. 06/23/2021 upon evaluation today patient appears to be doing well with regard to her wounds. She has been tolerating the dressing changes without complication. Fortunately there does not appear to be any evidence of infection and she has not had air in her home which she actually lives at an assisted living that got fixed this morning. With that being said because of that her wrap has been extremely hot and bothersome for her over the past  week. 06/30/2021 upon evaluation today patient is actually making excellent progress in regard to her ankle ulcer. She has been tolerating the dressing changes without complication and overall extremely pleased with where things stand there does not appear to be any evidence of active infection which is great news. No fevers, chills, nausea, vomiting, or diarrhea. 07/07/21 upon evaluation today patients and culture on the right actually appears to be doing quite well. There does not appear to be any signs of infection and overall very pleased with where things stand today. No fevers, chills, nausea, or vomiting noted at this time. 07/14/2021 unfortunately the patient today has some evidence of deep tissue injury and pressure getting to the ankle region. Again I am not exactly sure what is going on here but this is very similar to issues that we have had in the past. I explained to the patient that she needs to be very mindful of exactly what is happening I think sleeping in bed is probably the main issue here although there could be other culprits I am not sure what else would potentially lead to this kind of a problem for her. 07/21/2021 upon evaluation today patient's wound actually showing signs of improvement compared to last week. Fortunately there does not appear to be any signs of active infection which is great news and overall very pleased with where things stand in that regard. With that being said I do believe that she is continuing to show signs of overall of getting better although I think this is still basically about what we were 2 weeks ago due to the worsening and now improvement. 07/28/2021 upon evaluation today patient appears to be doing well with regard to her wound. She does have some slough buildup on the surface of the wound which I would have to manage today. Fortunately there is no sign of active infection at this time. No fevers, chills, nausea, vomiting, or diarrhea. 08/04/2021  upon evaluation today patient appears to be doing about the same in regard to her wound. To be perfectly honest I am beginning to be a little bit concerned about the overall appearance of the wound bed. I do think possibly taking a sample right around the margin of the wound could be beneficial for her as far as identifying anything such as an inflammatory process or to be honest even a skin tag cancer type process that may be of concern here. Fortunately there does not appear to be any evidence of active infection at this time which is great news she is not having any pain also great news. 08/11/2021 upon evaluation today patient appears to be doing well with regard to her wound. The good news is I did review her biopsy results and it showed some inflammatory mixed findings  but nothing that appeared to be malignant which is great news. Overall this is more of a chronic venous stasis type issue which again is more what we have been treating. Nonetheless I just wanted to make sure before going forward that there was not anything more untoward going on at this point. 08/18/2021 upon evaluation today patient appears to be doing well with regard to her ankle ulcer. Fortunately there does not appear to be any signs of active infection at this time which is great overall wound is dramatically improved compared to last week. Since last week I have actually placed her on doxycycline and subsequently this is a good option as far as the findings are concerned at this point. 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 the doxycycline is doing that. There does not appear to be any evidence of active infection systemically which is great news. 08/25/2021 upon evaluation today patient appears to be doing well with regard to her wound. I feel like we are finally get back on track as far  as healing is concerned I am much happier with the overall appearance today. I do think that she is tolerating the dressing changes without complication which is great news. We have been using Hydrofera Blue which I think is a good option. The good news is she is also doing great in regard to her compression sock on the left which is a zipper compression that seems to be doing a great job keeping her edema under good control. 09/01/2021 upon evaluation today patient appears to be doing well with regard to her wound. Infection seems to be under much better control which is great news and very pleased in that regard. Fortunately there does not appear to be any signs of infection currently. [name], [name] ([id number]) 09/08/2021 upon evaluation today patient actually appears to be making good progress in regard to her wound. She has been tolerating the dressing changes without complication. Fortunately there does not appear to be any evidence of active infection at this time which is great news as well. No fevers, chills, nausea, vomiting, or diarrhea. 09/22/2021 upon evaluation today patient appears to be doing well with regard to her wound although is very slow to heal. We have not looked into Apligraf yet I think that is something that we should see about doing. 09/29/2021 upon evaluation today patient's wound is actually showing signs of doing about the same. I am not seeing any evidence of worsening but also no significant evidence of improvement. We did gain approval for the organogenesis products all except for Apligraf as covered by her insurance. With that being said I do think that we can go ahead and proceed with the [name] if the patient and her family in agreement of the plan I discussed that with him today she does have a 20% coinsurance which we also discussed. 10/06/2021 upon evaluation today patient appears to unfortunately be doing a little bit worse she appears to be infected  based on what I am seeing. Fortunately there does not appear to be any signs of active infection at this time which is great news. Unfortunately it does appear to be some evidence of around the wound edge indicated by way of erythema and warmth as well as redness 10/13/2021 upon evaluation today patient actually appears to be doing excellent in regard to her  ankle ulcer compared to what it was. 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 I think were fine to go ahead and apply the [name] today. 10/20/2021 upon evaluation today patient appears to be doing excellent in fact the [name] seems to have done all some for her thus far. I am actually very pleased with where we stand and overall I think that she is making great progress. There is no evidence of active infection at this time. 11/23; patient presents for follow-up. She has no issues or complaints today. She denies signs of infection. She reports stability in her wound healing. 11/03/2021 upon evaluation today patient appears to be doing well with regard to her wounds. She has been tolerating the dressing changes without complication. Fortunately there is no signs of active infection at this time. 11/10/2021 upon evaluation today patient appears to be doing well with regard to her right ankle which is actually showing signs of improvement with the [name] I am very pleased. Subsequently in regards to the left ankle this appears to be doing okay with no evidence of issue here either. With that being said she has a significant contusion on the left leg from having fallen 2 days ago. She tells me that she was using her walker going to the closet and then when she got to the closet turned around to get something out at which point she fell according to  the story. Nonetheless she does have a hematoma just below her knee on the anterior portion of her shin. My hope is that this will not open until wound although we do need I think Some compression over it also think that we need to have her use an ice as well to help with the swelling and prevent this from getting worse. The last thing she needs is a wound opened up here. 11/17/2021 upon evaluation patient's right ankle actually showing signs of improvement based on what I see currently there is a lot of new skin growth coming in which is great news. Nonetheless I do feel like that the patient is showing improvement as well in regard to her left leg with the use of the Tubigrip it swollen but not as bruised as what I would have expected after what I was seeing last week. Nonetheless I do think doubling up on the Tubigrip would probably be beneficial to try to keep some of the edema under better control here. 11/24/2021 upon evaluation today patient appears to be doing excellent in regard to her wound. This is actually measuring significantly smaller which is great news. Fortunately I do not see any signs of active infection locally nor systemically at this point. Overall I am very pleased with [name] the [name] is doing. 12/30; wound bed looks healthy however not much change in overall wound volume. We applied [name] again today in the standard fashion Electronic Signature(s) Signed: 12/02/2021 4:08:28 PM By: [name] Ham MD Entered By: [name] on 12/02/2021 10:31:20 [name], [name] ([id number]) -------------------------------------------------------------------------------- Physical Exam Details Patient Name: [name] Date of Service: 12/02/2021 10:15 AM Medical Record Number: [id number] Patient Account Number: [id number] Date of Birth/Sex: 1923-02-26 (87 y.o. F) Treating RN: [name] Primary Care Provider: Adrian [name] Other Clinician: Referring Provider:  Adrian [name] Treating Provider/Extender: [name] in Treatment:  29 Constitutional Sitting or standing Blood Pressure is within target range for patient.. Pulse regular and within target range for patient.Marland Kitchen Respirations regular, non- labored and within target range.. Temperature is normal and within the target range for the patient.Marland Kitchen appears in no distress. Notes Wound exam; granulation tissue at the bottom this wound looks quite healthy. No debridement is required there is no evidence of surrounding cellulitis. The patient has palpable pulses but evidence of significant venous hypertension. We have her in 3 layer compression Electronic Signature(s) Signed: 12/02/2021 4:08:28 PM By: [name] Ham MD Entered By: [name] on 12/02/2021 10:32:00 [name], [name] ([id number]) -------------------------------------------------------------------------------- Physician Orders Details Patient Name: [name] Date of Service: 12/02/2021 10:15 AM Medical Record Number: [id number] Patient Account Number: [id number] Date of Birth/Sex: Sep 30, 1923 (87 y.o. 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 Bathing/ Shower/ Hygiene o May shower with wound dressing protected with water repellent cover or cast protector. Cellular or Tissue Based Products o Cellular or Tissue Based Product Type: - [name] #8 o Cellular or Tissue Based Product applied to wound  bed; including contact layer, fixation with steri-strips, dry gauze and cover dressing. (DO NOT REMOVE). Edema Control - Lymphedema / Segmental Compressive Device / Other o Optional: One layer of unna paste to top of compression wrap (to act as an anchor). - and at toes o Patient to wear own compression stockings. Remove compression stockings every night before going to bed and put on every morning when getting up. - left leg o Elevate, Exercise Daily and Avoid Standing for Long Periods of Time. o Elevate legs to the level of the heart and pump ankles as often as possible o Elevate leg(s) parallel to the floor when sitting. Wound Treatment Wound #7 - Malleolus Wound Laterality: Right, Lateral Cleanser: Soap and Water 1 x Per Week/30 Days Discharge Instructions: Gently cleanse wound with antibacterial soap, rinse and pat dry prior to dressing wounds Secondary Dressing: Gauze 1 x Per Week/30 Days Discharge Instructions: As directed: dry, moistened with saline or moistened with Dakins Solution Compression Wrap: Profore Lite LF 3 Multilayer Compression Bandaging System 1 x Per Week/30 Days Discharge Instructions: Apply 3 multi-layer wrap as prescribed. Electronic Signature(s) Signed: 12/02/2021 10:43:32 AM By: [name] Coria RN Signed: 12/02/2021 4:08:28 PM By: [name] Ham MD Entered By: [name] on 12/02/2021 10:43:32 [name], [name] ([id number]) -------------------------------------------------------------------------------- Problem List Details Patient Name: [name], [name] Date of Service: 12/02/2021 10:15 AM Medical Record Number: [id number] Patient Account Number: [id number] Date of Birth/Sex: [birth year] (87 y.o. 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, stage 3 05/13/2021 No Yes I89.0 Lymphedema, not elsewhere classified 05/13/2021 No Yes I87.2 Venous insufficiency (chronic) (peripheral) 05/13/2021 No Yes Z95.0 Presence of cardiac pacemaker 05/13/2021 No Yes Z79.01 Long term (current) use of anticoagulants 05/13/2021 No Yes Inactive Problems ICD-10 Code Description Active Date Inactive Date S80.12XA Contusion of left lower leg, initial encounter 11/10/2021 11/10/2021 Resolved  Problems Electronic Signature(s) Signed: 12/02/2021 4:08:28 PM By: [name] Ham MD Entered By: [name] on 12/02/2021 10:28:40 [name], [name] ([id number]) -------------------------------------------------------------------------------- Progress Note Details Patient Name: [name] Date of Service: 12/02/2021 10:15 AM Medical Record Number: [id number] Patient Account Number: [id number] Date of Birth/Sex: 01-May-1923 (87 y.o. 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 Dr. Amalia [name] the podiatrist saw her on 07/20/2017 and sent her to the wound center for possible hyperbaric oxygen therapy. past medical history of peripheral vascular disease, varicose veins, status post appendectomy, basal cell carcinoma excision from the left leg, cholecystectomy, pacemaker placement, right lower extremity angiography done by [name] in March 2017 with placement of a stent. there is also note of a successful ablation of the right small saphenous vein  done which was reviewed by ultrasound on 10/24/2016. the patient had a right small saphenous vein ablation done on 10/20/2016. The patient has never been a smoker. She has been seen by Dr. Corene Cornea [name] the vascular surgeon who most recently saw her on 06/15/2017 for evaluation of ongoing problems with right leg swelling. She had a lower extremity arterial duplex examination done(02/13/17) which showed patent distal right superficial femoral artery stent and above-the-knee popliteal stent without evidence of restenosis. The ABI was more than 1.3 on the right and more than 1.3 on the left. This was consistent with noncompressible arteries due to medial calcification. The right great toe pressure and PPG waveforms are within normal limits and the left great toe pressure and PPG waveforms are decreased. he recommended she continue to wear her compression stockings and continue with elevation. She is scheduled to have a noninvasive arterial study in the near future 08/16/2017 -- had a lower extremity arterial duplex examination done which showed patent distal right superficial femoral artery stent and above-the-knee popliteal stent without evidence of restenosis. The ABI was more than 1.3 on the right and more than 1.3 on the left. This was consistent with noncompressible arteries due to medial calcification. The right great toe pressure and PPG waveforms are within normal limits and the left great toe pressure and PPG waveforms are decreased. the x-ray of the right ankle has not yet been done 08/24/2017 -- had a right ankle x-ray -- IMPRESSION:1. No fracture, bone lesion or evidence of osteomyelitis. 2. Lateral soft tissue swelling with a soft tissue ulcer. she has not yet seen the vascular surgeon for review 08/31/17 on evaluation today patient's wound appears to be showing signs of improvement. She still with her appointment with vascular in order to review her results of her vascular study and then  determine if any intervention would be recommended at that time. No fevers, chills, nausea, or vomiting noted at this time. She has been tolerating the dressing changes without complication. 09/28/17 on evaluation today patient's wound appears to show signs of good improvement in regard to the granulation tissue which is surfacing. There is still a layer of slough covering the wound and the posterior portion is still significantly deeper than the anterior nonetheless there has been some good sign of things moving towards the better. She is going to go back to [name]  for reevaluation to ensure her blood flow is still appropriate. That will be before her next evaluation with [place] next week. No fevers, chills, nausea, or vomiting noted at this time. Patient does have some discomfort rated to be a 3-4/10 depending on activity specifically cleansing the wound makes it worse. 10/05/2017 -- the patient was seen by Dr. Lucky Cowboy last week and noninvasive studies showed a normal right ABI with brisk triphasic waveforms consistent with no arterial insufficiency including normal digital pressures. The duplex showed a patent distal right SFA stent and the proximal SFA was also normal. He was pleased with her test and thought she should have enough of perfusion for normal wound healing. He would see her back in 6 months time. 12/21/17 on evaluation today patient appears to be doing fairly well in regard to her right lateral ankle wound. Unfortunately the main issue that she is expansion at this point is that she is having some issues with what appears to be some cellulitis in the right anterior shin. She has also been noting a little bit of uncomfortable feeling especially last night and her ankle area. I'm afraid that she made the developing a little bit of an infection. With that being said I think it is in the early stages. 12/28/17 on evaluation today patient's ankle appears to be doing excellent. She's making good  progress at this point the cellulitis seems to have improved after last week's evaluation. Overall she is having no significant discomfort which is excellent news. She does have an appointment with [name] on March 29, 2018 for reevaluation in regard to the stent he placed. She seems to have excellent blood flow in the right lower extremity. 01/19/12 on evaluation today patient's wound appears to be doing very well. In fact she does not appear to require debridement at this point, there's no evidence of infection, and overall from the standpoint of the wound she seems to be doing very well. With that being said I believe that it may be time to switch to different dressing away from the [facility] Dressing she tells me she does have a lot going on her friend actually passed away yesterday and she's also having a lot of issues with her husband this obviously is weighing heavy on her as far as your thoughts and concerns today. 01/25/18 on evaluation today patient appears to be doing fairly well in regard to her right lateral malleolus. She has been tolerating the dressing changes without complication. Overall I feel like this is definitely showing signs of improvement as far as [name] the overall appearance of the wound is there's also evidence of epithelium start to migrate over the granulation tissue. In general I think that she is progressing nicely as far as the wound is concerned. The only concern she really has is whether or not we can switch to every other week visits in order to avoid having as many DALAYLA, ALDREDGE ([id number]) appointments as her daughters have a difficult time getting her to her appointments as well as the patient's husband to his he is not doing very well at this point. 02/22/18 on evaluation today patient's right lateral malleolus ulcer appears to be doing great. She has been tolerating the dressing changes without complication. Overall you making excellent progress at this  time. Patient is having no significant discomfort. 03/15/18 on evaluation today patient appears to be doing much more poorly in regard to her right lateral ankle ulcer at this point. Unfortunately since have last  seen her her husband has passed just a few days ago is obviously weighed heavily on her her daughter also had surgery well she is with her today as usual. There does not appear to be any evidence of infection she does seem to have significant contusion/deep tissue injury to the right lateral malleolus which was not noted previous when I saw her last. It's hard to tell of exactly when this injury occurred although during the time she was spending the night in the hospital this may have been most likely. 03/22/18 on evaluation today patient appears to actually be doing very well in regard to her ulcer. She did unfortunately have a setback which was noted last week however the good news is we seem to be getting back on track and in fact the wound in the core did still have some necrotic tissue which will be addressed at this point today but in general I'm seeing signs that things are on the up and up. She is glad to hear this obviously she's been somewhat concerned that due to the [name] her wound digressed more recently. 03/29/18 on evaluation today patient appears to be doing fairly well in regard to her right lower extremity lateral malleolus ulcer. She unfortunately does have a new area of pressure injury over the inferior portion where the wound has opened up a little bit larger secondary to the pressure she seems to be getting. She does tell me sometimes when she sleeps at night that it actually hurts and does seem to be pushing on the area little bit more unfortunately. There does not appear to be any evidence of infection which is good news. She has been tolerating the dressing changes without complication. She also did have some bruising in the left second and third toes due to the fact that  she may have bump this or injured it although she has neuropathy so she does not feel she did move recently that may have been where this came from. Nonetheless there does not appear to be any evidence of infection at this time. 04/12/18 on evaluation today patient's wound on the right lateral ankle actually appears to be doing a little bit better with a lot of necrotic docking tissue centrally loosening up in clearing away. However she does have the beginnings of a deep tissue injury on the left lateral malleolus likely due to the fact we've been trying offload the right as much as we have. I think she may benefit from an assistive soft device to help with offloading and it looks like they're looking at one of the doughnut conditions that wraps around the lower leg to offload which I think will definitely do a good job. With that being said I think we definitely need to address this issue on the left before it becomes a wound. Patient is not having significant pain. 04/19/18 on evaluation today patient appears to be doing excellent in regard to the progress she's made with her right lateral ankle ulcer. The left ankle region which did show evidence of a deep tissue injury seems to be resolving there's little fluid noted underneath and a blister there's nothing open at this point in time overall I feel like this is progressing nicely which is good news. She does not seem to be having significant discomfort at this point which is also good news. 04/25/18-She is here in follow up evaluation for bilateral lateral malleolar ulcers. The right lateral malleolus ulcer with pale subcutaneous tissue exposure, central area  of ulcer with tendon/periosteum exposed. The left lateral malleolus ulcer now with central area of nonviable tissue, otherwise deep tissue injury. She is wearing compression wraps to the left lower extremity, she will place the right lower extremity compression wraps on when she gets home. She  will be out of town over the weekend and return next week and follow-up appointment. She completed her doxycycline this morning 05/03/18 on evaluation today patient appears to be doing very well in regard to her right lateral ankle ulcer in general. At least she's showing some signs of improvement in this regard. Unfortunately she has some additional injury to the left lateral malleolus region which appears to be new likely even over the past several days. Again this determination is based on the overall appearance. With that being said the patient is obviously frustrated about this currently. 05/10/18-She is here in follow-up evaluation for bilateral lateral malleolar ulcers. She states she has purchased offloading shoes/boots and they will arrive tomorrow. She was asked to bring them in the office at next week's appointment so her provider is aware of product being utilized. She continues to sleep on right or left side, she has been encouraged to sleep on her back. The right lateral malleolus ulcer is precariously close to peri-osteum; will order xray. The left lateral malleolus ulcer is improved. Will switch back to santyl; she will follow up next week. 05/17/18 on evaluation today patient actually appears to be doing very well in regard to her malleolus her ulcers compared to last time I saw them. She does not seem to have as much in the way of contusion at this point which is great news. With that being said she does continue to have discomfort and I do believe that she is still continuing to benefit from the offloading/pressure reducing boots that were recommended. I think this is the key to trying to get this to heal up completely. 05/24/18 on evaluation today patient actually appears to be doing worse at this point in time unfortunately compared to her last week's evaluation. She is having really no increased pain which is good news unfortunately she does have more maceration in your theme and  noted surrounding the right lateral ankle the left lateral ankle is not really is erythematous I do not see signs of the overt cellulitis on that side. Unfortunately the wounds do not seem to have shown any signs of improvement since the last evaluation. She also has significant swelling especially on the right compared to previous some of this may be due to infection however also think that she may be served better while she has these wounds by compression wrapping versus continuing to use the Juxta-Lite for the time being. Especially with the amount of drainage that she is experiencing at this point. No fevers, chills, nausea, or vomiting noted at this time. 05/31/18 on evaluation today patient appears to actually be doing better in regard to her right lateral lower extremity ulcer specifically on the malleolus region. She has been tolerating the antibiotic without complication. With that being said she still continues to have issues but a little bit of redness although nothing like she what she was experiencing previous. She still continues to pressure to her ankle area she did get the problem on offloading boots unfortunately she will not wear them she states there too uncomfortable and she can't get in and out of the bed. Nonetheless at this point her wounds seem to be continually getting worse which is not what  we want I'm getting somewhat concerned about her progress and [name] things are going to proceed if we do not intervene in some way shape or form. I therefore had a very lengthy conversation today about offloading yet again and even made a specific suggestion for switching her to a memory foam mattress and even gave the information for a specific one that they could look at getting if it was something that they were interested in considering. She does not want to be considered for a hospital bed air mattress although honestly insurance would not cover it that she does not have any wounds on  her trunk. 06/14/18 on evaluation today both wounds over the bilateral lateral malleolus her ulcers appear to be doing better there's no evidence of pressure injury at this point. She did get the foam mattress for her bed and this does seem to have been extremely beneficial for her in my pinion. Her daughter states that she is having difficulty getting out of bed because of [name] soft it is. The patient also relates this to be. Nonetheless I do feel like she's actually doing better. Unfortunately right after and around the time she was getting the mattress she also sustained a fall when she got up to go pick up the phone and ended up injuring her right elbow she has 18 sutures in place. We are not caring for this currently although home health is going to be taking the sutures out shortly. Nonetheless this may be something that we need to evaluate going forward. It depends on GEORGIANNA, BAND ([id number]) [name] well it has or has not healed in the end. She also recently saw an orthopedic specialist for an injection in the right shoulder just before her fall unfortunately the fall seems to have worsened her pain. 06/21/18 on evaluation today patient appears to be doing about the same in regard to her lateral malleolus ulcers. Both appear to be just a little bit deeper but again we are clinging away the necrotic and dead tissue which I think is why this is progressing towards a deeper realm as opposed to improving from my measurement standpoint in that regard. Nonetheless she has been tolerating the dressing changes she absolutely hates the memory foam mattress topper that was obtained for her nonetheless I do believe this is still doing excellent as far as taking care of excess pressure in regard to the lateral malleolus regions. She in fact has no pressure injury that I see whereas in weeks past it was week by week I was constantly seeing new pressure injuries. Overall I think it has been very beneficial  for her. 07/03/18; patient arrives in my clinic today. She has deep punched out areas over her bilateral lateral malleoli. The area on the right has some more depth. We spent a lot of time today talking about pressure relief for these areas. This started when her daughter asked for a prescription for a memory foam mattress. I have never written a prescription for a mattress and I don't think insurances would pay for that on an ordinary bed. In any case he came up that she has foam boots that she refuses to wear. I would suggest going to these before any other offloading issues when she is in bed. They say she is meticulous about offloading this the rest of the day 07/10/18- She is seen in follow-up evaluation for bilateral, lateral malleolus ulcers. There is no improvement in the ulcers. She has purchased and is sleeping  on a memory foam mattress/overlay, she has been using the offloading boots nightly over the past week. She has a follow up appointment with vascular medicine at the end of October, in my opinion this follow up should be expedited given her deterioration and suboptimal TBI results. We will order plain film xray of the left ankle as deeper structures are palpable; would consider having MRI, regardless of xray report(s). The ulcers will be treated with iodoflex/iodosorb, she is unable to safely change the dressings daily with santyl. 07/19/18 on evaluation today patient appears to be doing in general visually well in regard to her bilateral lateral malleolus ulcers. She has been tolerating the dressing changes without complication which is good news. With that being said we did have an x-ray performed on 07/12/18 which revealed a slight loosen see in the lateral portion of the distal left fibula which may represent artifact but underline lytic destruction or osteomyelitis could not be excluded. MRI was recommended. With that being said we can see about getting the patient scheduled for an MRI  to further evaluate this area. In fact we have that scheduled currently for August 20 19,019. 07/26/18 on evaluation today patient's wound on the right lateral ankle actually appears to be doing fairly well at this point in my pinion. She has made some good progress currently. With that being said unfortunately in regard to the left lateral ankle ulcer this seems to be a little bit more problematic at this time. In fact as I further evaluated the situation she actually had bone exposed which is the first time that's been the case in the bone appear to be necrotic. Currently I did review patient's note from Dr. Bunnie Domino office with [facility]. He stated that ABI was 1.26 on the right and 0.95 on the left with good waveforms. Her perfusion is stable not reduced from previous studies and her digital waveforms were pretty good particularly on the right. His conclusion upon review of the note was that there was not much she could do to improve her perfusion and he felt she was adequate for wound healing. His suggestion was that she continued to see [place] and consider a synthetic skin graft if there was no underlying infection. He plans to see her back in six months or as needed. 08/01/18 on evaluation today patient appears to be doing better in regard to her right lateral ankle ulcer. Her left lateral ankle ulcer is about the same she still has bone involvement in evidence of necrosis. There does not appear to be evidence of infection at this time On the right lateral lower extremity. I have started her on the Augmentin she picked this up and started this yesterday. This is to get her through until she sees infectious disease which is scheduled for 08/12/18. 08/06/18 on evaluation today patient appears to be doing rather well considering my discussion with patient's daughter at the end of last week. The area which was marked where she had erythema seems to be improved and this is good news.  With that being said overall the patient seems to be making good improvement when it comes to the overall appearance of the right lateral ankle ulcer although this has been slow she at least is coming around in this regard. Unfortunately in regard to the left lateral ankle ulcer this is osteomyelitis based on the pathology report as well is bone culture. Nonetheless we are still waiting CT scan. Unfortunately the MRI we originally ordered  cannot be performed as the patient is a pacemaker which I had overlooked. Nonetheless we are working on the CT scan approval and scheduling as of now. She did go to the hospital over the weekend and was placed on IV Cefzo for a couple of days. Fortunately this seems to have improved the erythema quite significantly which is good news. There does not appear to be any evidence of worsening infection at this time. She did have some bleeding after the last debridement therefore I did not perform any sharp debridement in regard to left lateral ankle at this point. Patient has been approved for a snap vac for the right lateral ankle. 08/14/18; the patient with wounds over her bilateral lateral malleoli. The area on the right actually looks quite good. Been using a snap back on this area. Healthy granulation and appears to be filling in. Unfortunately the area on the left is really problematic. 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 Also noted to have cellulitis. She saw Dr. Novella Olive of infectious disease today and was put on linezolid. We are able to verify this with her pharmacy. She is completed the Augmentin that she was already on. We've been using Iodoflex to this area 08/23/18 on evaluation today patient's wounds both actually appear to be doing better compared to my prior evaluations. Fortunately she showing signs of good improvement in regard to the overall wound status especially  where were using the snap vac on the right. In regard to left lateral malleolus the wound bed actually appears to be much cleaner than previously noted. I do not feel any phone directly probed during evaluation today and though there is tendon noted this does not appear to be necrotic it's actually fairly good as far as the overall appearance of the tendon is concerned. In general the wound bed actually appears to be doing significantly better than it was previous. Patient is currently in the care of [name] and I did review that note today. He actually has her on two weeks of linezolid and then following the patient will be on 1-2 months of Keflex. That is the plan currently. She has been on antibiotics therapy as prescribed by myself initially starting on July 30, 2018 and has been on that continuously up to this point. 08/30/18 on evaluation today patient actually appears to be doing much better in regard to her right lateral malleolus ulcer. She has been tolerating the dressing changes specifically the snap vac without complication although she did have some issues with the seal currently. Apparently there was some trouble with getting it to maintain over the past week past Sunday. Nonetheless overall the wound appears better in regard to the right lateral malleolus region. In regard to left lateral malleolus this actually show some signs of additional granulation although there still tendon noted in the base of the wound this appears to be healthy not necrotic in any way whatsoever. We are considering potentially using a snap vac for the left lateral malleolus as well the product wrap from [employer], Reese, was present in the clinic today we're going to see this patient I did have her come in with me after obtaining consent from the patient and her daughter in order to look at the wound and see if there's any recommendation one way or another as to whether or not they felt the snapback could be  beneficial for the left lateral  malleolus region. But EVAH, RASHID ([id number]) the conclusion was that it might be but that this is definitely a little bit deeper wound than what traditionally would be utilized for a snap vac. 09/06/18 on evaluation today patient actually appears to be doing excellent in my pinion in regard to both ankle ulcers. She has been tolerating the dressing changes without complication which is great news. Specifically we have been using the snap vac. In regard to the right ankle I'm not even sure that this is going to be necessary for today and following as the wound has filled in quite nicely. In regard to the left ankle I do believe that we're seeing excellent epithelialization from the edge as well as granulation in the central portion the tendon is still exposed but there's no evidence of necrotic bone and in general I feel like the patient has made excellent progress even compared to last week with just one week of the snap vac. 09/11/18; this is a patient who has wounds on her bilateral lateral malleoli. Initially both of these were deep stage IV wounds in the setting of chronic arterial insufficiency. She has been revascularized. As I understand think she been using snap vacs to both of these wounds however the area on the right became more superficial and currently she is only using it on the left. Using silver collagen on the right and silver collagen under the back on the left I believe 09/19/18 on evaluation today patient actually appears to be doing very well in regard to her lateral malleolus or ulcers bilaterally. She has been tolerating the dressing changes without complication. Fortunately there does not appear to be any evidence of infection at this time. Overall I feel like she is improving in an excellent manner and I'm very pleased with the fact that everything seems to be turning towards the better for her. This has obviously been a long  road. 09/27/18 on evaluation today patient actually appears to be doing very well in regard to her bilateral lateral malleolus ulcers. She has been tolerating the dressing changes without complication. Fortunately there does not appear to be any evidence of infection at this time which is also great news. No fevers, chills, nausea, or vomiting noted at this time. Overall I feel like she is doing excellent with the snap vac on the left malleolus. She had 40 mL of fluid collection over the past week. 10/04/18 on evaluation today patient actually appears to be doing well in regard to her bilateral lateral malleolus ulcers. She continues to tolerate the dressing changes without complication. One issue that I see is the snap vac on the left lateral malleolus which appears to have sealed off some fluid underlying this area and has not really allowed it to heal to the degree that I would like to see. For that reason I did suggest at this point we may want to pack a small piece of packing strip into this region to allow it to more effectively wick out fluid. 10/11/18 in general the patient today does not feel that she has been doing very well. She's been a little bit lethargic and subsequently is having bodyaches as well according to what she tells me today. With that being said overall she has been concerned with the fact that something may be worsening although to be honest her wounds really have not been appearing poorly. She does have a new ulcer on her left heel unfortunately. This may be pressure related. Nonetheless it  seems to me to have potentially started at least as a blister I do not see any evidence of deep tissue injury. In regard to the left ankle the snap vac still seems to be causing the ceiling off of the deeper part of the wound which is in turn trapping fluid. I'm not extremely pleased with the overall appearance as far as progress from last week to this week therefore I'm gonna discontinue  the snap vac at this point. 10/18/18 patient unfortunately this point has not been feeling well for the past several days. She was seen by Grayland Ormond her primary care provider who is a Librarian, academic at [facility]. Subsequently she states that she's been very weak and generally feeling malaise. No fevers, chills, nausea, or vomiting noted at this time. With that being said bloodwork was performed at the PCP office on the 11th of this month which showed a white blood cell count of 10.7. This was repeated today and shows a white blood cell count of 12.4. This does show signs of worsening. Coupled with the fact that she is feeling worse and that her left ankle wound is not really showing signs of improvement I feel like this is an indication that the osteomyelitis is likely exacerbating not improving. Overall I think we may also want to check her C-reactive protein and sedimentation rate. Actually did call [facility] office this afternoon while the patient was in the office here with me. Subsequently based on the findings we discussed treatment possibilities and I think that it is appropriate for [place] to go ahead and initiate treatment with doxycycline which I'm going to do. Subsequently he did agree to see about adding a CRP and sedimentation rate to her orders. If that has not already been drawn to where they can run it they will contact the patient she can come back to have that check. They are in agreement with plan as far as the patient and her daughter are concerned. Nonetheless also think we need to get in touch with Dr. Henreitta Leber office to see about getting the patient scheduled with him as soon as possible. 11/08/18 on evaluation today patient presents for follow-up concerning her bilateral foot and ankle ulcers. I did do an extensive review of her chart in epic today. Subsequently she was seen by [name] he did initiate Cefepime IV antibiotic therapy. Subsequently she had some  issues with her PICC line this had to be removed because it was coiled and then replaced. Fortunately that was now settled. Unfortunately she has continued have issues with her left heel as well as the issues that she is experiencing with her bilateral lateral malleolus regions. I do believe however both areas seem to be doing a little bit better on evaluation today which is good news. No fevers, chills, nausea, or vomiting noted at this time. She actually has an angiogram schedule with [name] on this coming Monday, November 11, 2018. Subsequently the patient states that she is feeling much better especially than what she was roughly 2 weeks ago. She actually had to cancel an appointment because she was feeling so poorly. No fevers, chills, nausea, or vomiting noted at this time. 11/15/18 on evaluation today patient actually is status post having had her angiogram with [name] Monday, four days ago. 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 He had to go and work on several areas of the vasculature fortunately he was able to obtain  no more than a 30% residual stenosis throughout post procedure. I reviewed this note today. I think this will definitely help with healing at this time. Fortunately there does not appear to be any signs of infection and I do feel like ratio already has a better appearance to it. 11/22/18 upon evaluation today patient actually appears to be doing very well in regard to her wounds in general. The right lateral malleolus looks excellent the heel looks better in the left lateral malleolus also appears to be doing a little better. With that being said the right second toe actually appears to be open and training we been watching this is been dry and stable but now is open. 12/03/2018 Seen today for follow-up and management of multiple bilateral lower extremity wounds. New pressure injury of the great toe which is closed at this time. Wound of the right  distal second toe appears larger today with deep undermining and a pocket of fluid present within the undermining region. Left and right malleolus is wounds are stable today with no signs and symptoms of infection.Denies any needs or concerns during exam today. 12/13/18 on evaluation today patient appears to be doing somewhat better in regard to her left heel ulcer. She also seems to be completely healed in regard to the right lateral malleolus ulcer. The left malleolus ulcer is smaller what unfortunately the wounds which are new over the first and second toes of the right foot are what are most concerning at this point especially the second. Both areas did require sharp debridement today. 12/20/18 on evaluation today patient's wound actually appears to be doing better in regard to left lateral ankle and her right lateral ankle continues to remain healed. The hill ulcer on the left is improved. She does have improvement noted as well in regard to both toe ulcers. Overall I'm very pleased in this regard. No fevers, chills, nausea, or vomiting noted at this time. ROSEA, DORY ([id number]) 12/23/18 on evaluation today patient is seen after she had her toenails trimmed at the podiatrist office due to issues with her right great toe. There was what appeared to be dark eschar on the surface of the wound which had her in the podiatrist concerned. Nonetheless as I remember that during the last office visit I had utilize silver nitrate of this area I was much less concerned about the situation. Subsequently I was able to clean off much of this tissue without any complication today. This does not appear to show any signs of infection and actually look somewhat better compared to last time post debridement. Her second toe on the right foot actually had callous over and there did appear still be some fluid underneath this that would require debridement today. 12/27/18 on evaluation today patient actually  appears to be showing signs of improvement at all locations. Even the left lateral ankle although this is not quite as great as the other sites. Fortunately there does not appear to be any signs of infection at this time and both of her toes on the right foot seem to be showing signs of improvement which is good news and very pleased in this regard. 01/03/19 on evaluation today patient appears to be doing better for the most part in regard to her wounds in particular. There does not appear to be any evidence of infection at this time which is good news. Fortunately there is no sign of really worsening anywhere except for the right great toe which she does have  what appears to be a bruise/deep tissue injury which is very superficial and already resolving. I'm not sure where this came from I questioned her extensively and she does not recall what may have happened with this. Other than that the patient seems to be doing well even the left lateral ankle ulcer looks good and is getting smaller. 01/10/19 on evaluation today patient appears to be doing well in regard to her left heel wound and both of her toe wounds. Overall I feel like there is definitely improvement here and I'm happy in that regard. With that being said unfortunately she is having issues with the left lateral malleolus ulcer which unfortunately still has a lot of depth to it. This is gonna be a very difficult wound for [place] to be able to truly get to heal. I may want to consider some type of skin substitute to see if this would be of benefit for her. I'll discuss this with her more the next visit most likely. This was something I thought about more at the end of the visit when I was Artie out of the room and the patient had been discharged. 01/17/19 on evaluation today patient appears to be doing very well in regard to her wounds in general. She's been making excellent progress at this time. Fortunately there's no sign of infection at this time  either. No fevers, chills, nausea, or vomiting noted at this time. The biggest issue is still her left lateral malleolus where it appears to be doing well and is getting smaller but still shows a small corner where this is deeper and goes down into what appears to be the joint space. Nonetheless this is taking much longer to heal although it still looks better in smaller than previous evaluations. 01/24/19 on evaluation today patient's wounds actually appear to be doing rather well in general overall. She did require some sharp debridement in regard to the right great toe but everything else appears to be doing excellent no debridement was even necessary. No fevers, chills, nausea, or vomiting noted at this time. 01/31/19 on evaluation today patient actually appears to be doing much better in regard to her left foot wound on the heel as well as the ankle. The right great toe appears to be a little bit worse today this had callous over and trapped a lot of fluid underneath. Fortunately there's no signs of infection at any site which is great news. 02/07/19 on evaluation today patient actually appears to be doing decently well in regard to all of her ulcers at this point. No sharp debridement was required she is a little bit of hyper granulation in regard to the left lateral ankle as well as the left heel but the hill itself is almost completely healed which is excellent news. Overall been very pleased in this regard. 02/14/19 on evaluation today patient actually appears to be doing very well in regard to her ulcers on the right first toe, left lateral malleolus, and left heel. In fact the heel is almost completely healed at this point. The patient does not show any signs of infection which is good news. Overall very pleased with [name] things have progressed. 04/18/19 Telehealth Evaluation During the COVID-19 National Emergency: Verbal Consent: Obtained from patient Allergies: reviewed and the active list is  current. Medication changes: patient has no current medication changes. COVID-19 Screening: 1. Have you traveled internationally or on a cruise ship in the last 14 dayso No 2. Have you had contact with  someone with or under investigation for COVID-19o No 3. Have you had a fever, cough, sore throat, or experiencing shortness of breatho No on evaluation today actually did have a visit with this patient through a telehealth encounter with her home health nurse. Subsequently it was noted that the patient actually appears to be doing okay in regard to her wounds both the right great toe as well as the left lateral malleolus have shown signs of improvement although this in your theme around the left lateral malleolus there eschar coverings for both locations. The question is whether or not they are actually close and whether or not home health needs to discharge the patient or not. Nonetheless my concern is this point obviously is that without actually seeing her and being able to evaluate this directly I cannot ensure that she is completely healed which is the question that I'm being asked. 04/22/19 on evaluation today patient presents for her first evaluation since last time I saw her which was actually February 14, 2019. I did do a telehealth visit last week in which point it was questionable whether or not she may be healed and had to bring her in today for confirmation. With that being said she does seem to be doing quite well at this point which is good news. There does not appear to be any drainage in the deed I believe her wounds may be healed. Readmission: 09/04/2019 on evaluation today patient appears to be doing unfortunately somewhat more poorly in regard to her left foot ulcer secondary to a wound that began on 08/21/2019 at least when she first noticed this. Fortunately she has not had any evidence of active infection at this time. Systemically. I also do not necessarily see any evidence of  infection at the blister/wound site on the first metatarsal head plantar aspect. This almost appears to be something that may have just rubbed inappropriately causing this to breakdown. They did not want a wait too long to come in to be seen as again she had significant issues in the past with wounds that took quite a while to heal in fact it was close to 2 years. Nonetheless this does not appear to be quite that bad but again we do need to remove some of the necrotic tissue from the surface of the wound to tell exactly the extent. She does not appear to have any significant arterial disease at this point and again her last ABIs and TBI's are recorded above in the alert section her left ABI was 1.27 with a TBI of 0.72 to the right ABI 1.08 with a TBI of 0.39. Other than this the patient has been doing quite BINDU, DOCTER ([id number]) well since I last saw her and that was in May 2020. 09/11/2019 on evaluation today patient appeared to be doing very well with regard to her plantar foot ulcer on the left. In fact this appears to be almost completely healed which is awesome. That is after just 1 week of intervention. With that being said there is no signs of active infection at this time. 09/18/2019 on evaluation today patient actually appears to be doing excellent in fact she is completely healed based on what I am seeing at this point. Fortunately there is no signs of active infection at this time and overall patient is very pleased to hear that this area has healed so quickly. Readmission: 05/13/2021 upon evaluation today this patient presents for reevaluation here in the clinic. This is a wound  that actually we previously took care of. She had 1 on the right ankle and the left the left turned out to be be harder due to to heal but nonetheless is doing great at this point as the right that has reopened and it was noted first just several weeks ago with a scab over it and came off in just the past  few days. Fortunately there does not appear to be any obvious evidence of significant active infection at this time which is great news. No fevers, chills, nausea, vomiting, or diarrhea. 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 There does not appear to be any signs of this interfering in any way with her wound. She does have swelling we previously had compression socks for her ordered but again it does not look like she wears these on a regular basis by any means. 05/26/2021 upon evaluation today patient appears to be doing well with regard to her wound which is actually showing signs of excellent improvement. There does not appear to be any signs of active infection which is great news and overall very pleased with where things stand today. No fevers, chills, nausea, vomiting, or diarrhea. 06/02/2021 upon evaluation today patient's wound actually showing signs of excellent improvement. Fortunately there does not appear to be any signs of active infection which is great news. I think the patient is making good progress with regard to her wounds in general. 06/09/2021 upon evaluation today patient appears to be doing excellent in regard to her wounds currently. Fortunately there does not appear to be any signs of active infection which is great news. No fevers, chills, nausea, vomiting, or diarrhea. Overall extremely pleased with where things stand today. I think the patient is making excellent progress. 06/16/2021 upon evaluation today patient appears to be doing well in regard to her wound. This is going require little bit of debridement today and that was discussed with the patient. Otherwise she seems to be doing quite well and I am actually very pleased with where things stand at this point. No fevers, chills, nausea, vomiting, or diarrhea. 06/23/2021 upon evaluation today patient appears to be doing well with regard to her wounds. She has been  tolerating the dressing changes without complication. Fortunately there does not appear to be any evidence of infection and she has not had air in her home which she actually lives at an assisted living that got fixed this morning. With that being said because of that her wrap has been extremely hot and bothersome for her over the past week. 06/30/2021 upon evaluation today patient is actually making excellent progress in regard to her ankle ulcer. She has been tolerating the dressing changes without complication and overall extremely pleased with where things stand there does not appear to be any evidence of active infection which is great news. No fevers, chills, nausea, vomiting, or diarrhea. 07/07/21 upon evaluation today patients and culture on the right actually appears to be doing quite well. There does not appear to be any signs of infection and overall very pleased with where things stand today. No fevers, chills, nausea, or vomiting noted at this time. 07/14/2021 unfortunately the patient today has some evidence of deep tissue injury and pressure getting to the ankle region. Again I am not exactly sure what is going on here but this is very similar to issues that we have had in the past. I explained to the patient that she needs  to be very mindful of exactly what is happening I think sleeping in bed is probably the main issue here although there could be other culprits I am not sure what else would potentially lead to this kind of a problem for her. 07/21/2021 upon evaluation today patient's wound actually showing signs of improvement compared to last week. Fortunately there does not appear to be any signs of active infection which is great news and overall very pleased with where things stand in that regard. With that being said I do believe that she is continuing to show signs of overall of getting better although I think this is still basically about what we were 2 weeks ago due to the  worsening and now improvement. 07/28/2021 upon evaluation today patient appears to be doing well with regard to her wound. She does have some slough buildup on the surface of the wound which I would have to manage today. Fortunately there is no sign of active infection at this time. No fevers, chills, nausea, vomiting, or diarrhea. 08/04/2021 upon evaluation today patient appears to be doing about the same in regard to her wound. To be perfectly honest I am beginning to be a little bit concerned about the overall appearance of the wound bed. I do think possibly taking a sample right around the margin of the wound could be beneficial for her as far as identifying anything such as an inflammatory process or to be honest even a skin tag cancer type process that may be of concern here. Fortunately there does not appear to be any evidence of active infection at this time which is great news she is not having any pain also great news. 08/11/2021 upon evaluation today patient appears to be doing well with regard to her wound. The good news is I did review her biopsy results and it showed some inflammatory mixed findings but nothing that appeared to be malignant which is great news. Overall this is more of a chronic venous stasis type issue which again is more what we have been treating. Nonetheless I just wanted to make sure before going forward that there was not anything more untoward going on at this point. 08/18/2021 upon evaluation today patient appears to be doing well with regard to her ankle ulcer. Fortunately there does not appear to be any signs of active infection at this time which is great overall wound is dramatically improved compared to last week. Since last week I have actually placed her on doxycycline and subsequently this is a good option as far as the findings are concerned at this point. 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 the doxycycline is doing that. There does not appear to be any evidence of active infection systemically which is great news. 08/25/2021 upon evaluation today patient appears to be doing well with regard to her wound. I feel like we are finally get back on track as far as healing is concerned I am much happier with the overall appearance today. I do think that she is tolerating the dressing changes without complication which is great news. We have been using Hydrofera Blue which I think is a good option. The good news is she is also doing great in [place], NEELEY SEDIVY. ([id number]) regard to her compression sock on the left which is a zipper compression that seems to be doing  a great job keeping her edema under good control. 09/01/2021 upon evaluation today patient appears to be doing well with regard to her wound. Infection seems to be under much better control which is great news and very pleased in that regard. Fortunately there does not appear to be any signs of infection currently. 09/08/2021 upon evaluation today patient actually appears to be making good progress in regard to her wound. She has been tolerating the dressing changes without complication. Fortunately there does not appear to be any evidence of active infection at this time which is great news as well. No fevers, chills, nausea, vomiting, or diarrhea. 09/22/2021 upon evaluation today patient appears to be doing well with regard to her wound although is very slow to heal. We have not looked into Apligraf yet I think that is something that we should see about doing. 09/29/2021 upon evaluation today patient's wound is actually showing signs of doing about the same. I am not seeing any evidence of worsening but also no significant evidence of improvement. We did gain approval for the organogenesis products all except for Apligraf as covered by her insurance. With that being  said I do think that we can go ahead and proceed with the [name] if the patient and her family in agreement of the plan I discussed that with him today she does have a 20% coinsurance which we also discussed. 10/06/2021 upon evaluation today patient appears to unfortunately be doing a little bit worse she appears to be infected based on what I am seeing. Fortunately there does not appear to be any signs of active infection at this time which is great news. Unfortunately it does appear to be some evidence of around the wound edge indicated by way of erythema and warmth as well as redness 10/13/2021 upon evaluation today patient actually appears to be doing excellent in regard to her ankle ulcer compared to what it was. 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 I think were fine to go ahead and apply the [name] today. 10/20/2021 upon evaluation today patient appears to be doing excellent in fact the [name] seems to have done all some for her thus far. I am actually very pleased with where we stand and overall I think that she is making great progress. There is no evidence of active infection at this time. 11/23; patient presents for follow-up. She has no issues or complaints today. She denies signs of infection. She reports stability in her wound healing. 11/03/2021 upon evaluation today patient appears to be doing well with regard to her wounds. She has been tolerating the dressing changes without complication. Fortunately there is no signs of active infection at this time. 11/10/2021 upon evaluation today patient appears to be doing well with regard to her right ankle which is actually showing signs of improvement with the [name] I am very pleased. Subsequently in regards to the left ankle this  appears to be doing okay with no evidence of issue here either. With that being said she has a significant contusion on the left leg from having fallen 2 days ago. She tells me that she was using her walker going to the closet and then when she got to the closet turned around to get something out at which point she fell according to the  story. Nonetheless she does have a hematoma just below her knee on the anterior portion of her shin. My hope is that this will not open until wound although we do need I think Some compression over it also think that we need to have her use an ice as well to help with the swelling and prevent this from getting worse. The last thing she needs is a wound opened up here. 11/17/2021 upon evaluation patient's right ankle actually showing signs of improvement based on what I see currently there is a lot of new skin growth coming in which is great news. Nonetheless I do feel like that the patient is showing improvement as well in regard to her left leg with the use of the Tubigrip it swollen but not as bruised as what I would have expected after what I was seeing last week. Nonetheless I do think doubling up on the Tubigrip would probably be beneficial to try to keep some of the edema under better control here. 11/24/2021 upon evaluation today patient appears to be doing excellent in regard to her wound. This is actually measuring significantly smaller which is great news. Fortunately I do not see any signs of active infection locally nor systemically at this point. Overall I am very pleased with [name] the [name] is doing. 12/30; wound bed looks healthy however not much change in overall wound volume. We applied [name] again today in the standard fashion Objective Constitutional Sitting or standing Blood Pressure is within target range for patient.. Pulse regular and within target range for patient.Marland Kitchen Respirations regular, non- labored and within target range.. Temperature  is normal and within the target range for the patient.Marland Kitchen appears in no distress. Vitals Time Taken: 10:10 AM, Height: 62 in, Weight: 150 lbs, BMI: 27.4, Temperature: 98.3 F, Pulse: 73 bpm, Respiratory Rate: 18 breaths/min, Blood Pressure: 135/78 mmHg. General Notes: Wound exam; granulation tissue at the bottom this wound looks quite healthy. No debridement is required there is no evidence of surrounding cellulitis. The patient has palpable pulses but evidence of significant venous hypertension. We have her in 3 layer compression Integumentary (Hair, Skin) Wound #7 status is Open. Original cause of wound was Gradually Appeared. The date acquired was: 05/12/2021. The wound has been in treatment AELA, BOHAN. ([id number]) 29 weeks. The wound is located on the Right,Lateral Malleolus. The wound measures 0.6cm length x 0.7cm width x 0.2cm depth; 0.33cm^2 area and 0.066cm^3 volume. There is Fat Layer (Subcutaneous Tissue) exposed. There is no tunneling or undermining noted. There is a medium amount of serosanguineous drainage noted. There is large (67-100%) red granulation within the wound bed. There is no necrotic tissue within the wound bed. Assessment Active Problems ICD-10 Pressure ulcer of right ankle, stage 3 Lymphedema, not elsewhere classified Venous insufficiency (chronic) (peripheral) Presence of cardiac pacemaker Long term (current) use of anticoagulants Procedures Wound #7 Pre-procedure diagnosis of Wound #7 is a Pressure Ulcer located on the Right,Lateral Malleolus. A skin graft procedure using a bioengineered skin substitute/cellular or tissue based product was performed by [name] Dillon, MD with the following instrument(s): Forceps. [name] was applied and secured with Steri-Strips. 1.6 sq cm of product was utilized and 0 sq cm was wasted. Post Application, adaptic was applied. A Time Out was conducted at 10:30, prior to the start of the procedure. The procedure was  tolerated well with a pain level of 0 throughout and a pain level of 0 following the procedure. Post procedure Diagnosis Wound #7: Same as Pre-Procedure .  Pre-procedure diagnosis of Wound #7 is a Pressure Ulcer located on the Right,Lateral Malleolus . There was a Three Layer Compression Therapy Procedure by [name] Coria, RN. Post procedure Diagnosis Wound #7: Same as Pre-Procedure Plan Follow-up Appointments: Return Appointment in 1 week. Bathing/ Shower/ Hygiene: May shower with wound dressing protected with water repellent cover or cast protector. Cellular or Tissue Based Products: Cellular or Tissue Based Product Type: - [name] #8 Cellular or Tissue Based Product applied to wound bed; including contact layer, fixation with steri-strips, dry gauze and cover dressing. (DO NOT REMOVE). Edema Control - Lymphedema / Segmental Compressive Device / Other: Optional: One layer of unna paste to top of compression wrap (to act as an anchor). - and at toes Patient to wear own compression stockings. Remove compression stockings every night before going to bed and put on every morning when getting up. - left leg Elevate, Exercise Daily and Avoid Standing for Long Periods of Time. Elevate legs to the level of the heart and pump ankles as often as possible Elevate leg(s) parallel to the floor when sitting. WOUND #7: - Malleolus Wound Laterality: Right, Lateral Cleanser: Soap and Water 1 x Per Week/30 Days Discharge Instructions: Gently cleanse wound with antibacterial soap, rinse and pat dry prior to dressing wounds Secondary Dressing: Gauze 1 x Per Week/30 Days Discharge Instructions: As directed: dry, moistened with saline or moistened with Dakins Solution Compression Wrap: Profore Lite LF 3 Multilayer Compression Bandaging System 1 x Per Week/30 Days Discharge Instructions: Apply 3 multi-layer wrap as prescribed. 1. New shield applied in the standard fashion. She will be back next week and  follow-up. Last application in the current series 2. No evidence of infection FUSAE, FLORIO ([id number]) Electronic Signature(s) Signed: 12/07/2021 3:50:39 PM By: Gretta Cool, BSN, RN, CWS, Kim RN, BSN Signed: 12/07/2021 4:08:25 PM By: [name] Ham MD Previous Signature: 12/02/2021 4:08:28 PM Version By: [name] Ham MD Entered By: Gretta Cool, BSN, RN, CWS, Kim on 12/07/2021 15:50:39 Fiske, [name] ([id number]) -------------------------------------------------------------------------------- [place] Details Patient Name: GRACEE, RATTERREE Date of Service: 12/02/2021 Medical Record Number: [id number] Patient Account Number: [id number] Date of Birth/Sex: 05-06-1923 (87 y.o. F) Treating RN: [name] Primary Care Provider: Adrian [name] Other Clinician: Referring Provider: Adrian [name] Treating Provider/Extender: [name] in Treatment: 29 Diagnosis Coding ICD-10 Codes Code Description 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, stage 3 I89.0 Lymphedema, not elsewhere classified I87.2 Venous insufficiency (chronic) (peripheral) Z95.0 Presence of cardiac pacemaker Z79.01 Long term (current) use of anticoagulants Facility Procedures CPT4 Code: 58832549 Description: I2641 [name] 1.6 SQ CM (CHG PER SQ CM) Modifier: Quantity: 2 CPT4 Code: 58309407 Description: 68088 - SKIN SUB GRAFT TRNK/ARM/LEG Modifier: Quantity: 1 CPT4 Code: Description: ICD-10 Diagnosis Description 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, stage 3 Modifier: Quantity: Physician Procedures CPT4 Code: 1103159 Description: 45859 - WC PHYS SKIN SUB GRAFT TRNK/ARM/LEG Modifier: Quantity: 1 CPT4 Code: Description: ICD-10 Diagnosis Description 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, stage 3 Modifier: Quantity: Electronic Signature(s) Signed: 12/02/2021 10:46:37 AM By: [name] Coria RN Signed: 12/02/2021 4:08:28 PM By: [name] Ham MD Previous Signature: 12/02/2021 10:46:07 AM Version By:  [name] Coria RN Entered By: [name] on 12/02/2021 10:46:36

## 2021-12-02 NOTE — Progress Notes (Signed)
[name] ([id number]) Visit Report for 12/02/2021 Arrival Information Details Patient Name: [name], [name]. Date of Service: 12/02/2021 10:15 AM Medical Record Number: [id number] Patient Account Number: [id number] Date of Birth/Sex: 1923/07/05 (87 y.o. F) Treating RN: Carlene Coria Primary Care Mariaha Ellington: Adrian Prows Other Clinician: Referring Desera Graffeo: Adrian Prows Treating Karron Goens/Extender: Tito Dine in Treatment: 25 Visit Information History Since Last Visit All ordered tests and consults were completed: No Patient Arrived: [name] Added or deleted any medications: No Arrival Time: 10:05 Any new allergies or adverse reactions: No Accompanied By: daughter Had a fall or experienced change in No Transfer Assistance: None activities of daily living that may affect Patient Identification Verified: Yes risk of falls: Secondary Verification Process Completed: Yes Signs or symptoms of abuse/neglect since last visito No Patient Requires Transmission-Based No Hospitalized since last visit: No Precautions: Implantable device outside of the clinic excluding No Patient Has Alerts: Yes cellular tissue based products placed in the center Patient Alerts: Patient on Blood since last visit: Thinner Has Dressing in Place as Prescribed: Yes NOT DIABETIC Has Compression in Place as Prescribed: Yes ***ELIQUIS*** Pain Present Now: No Electronic Signature(s) Signed: 12/02/2021 4:17:41 PM By: Carlene Coria RN Entered By: Carlene Coria on 12/02/2021 10:10:07 Blaydes, [name] ([id number]) -------------------------------------------------------------------------------- Clinic Level of Care Assessment Details Patient Name: [name] Date of Service: 12/02/2021 10:15 AM Medical Record Number: [id number] Patient Account Number: [id number] Date of Birth/Sex: 1922/12/21 (87 y.o. F) Treating RN: Carlene Coria Primary Care Jung Ingerson: Adrian Prows Other  Clinician: Referring Marico Buckle: Adrian Prows Treating Nickayla Mcinnis/Extender: Tito Dine in Treatment: 29 Clinic Level of Care Assessment Items TOOL 1 Quantity Score []  - Use when EandM and Procedure is performed on INITIAL visit 0 ASSESSMENTS - Nursing Assessment / Reassessment []  - General Physical Exam (combine w/ comprehensive assessment (listed just below) when performed on new 0 pt. evals) []  - 0 Comprehensive Assessment (HX, ROS, Risk Assessments, Wounds Hx, etc.) ASSESSMENTS - Wound and Skin Assessment / Reassessment []  - Dermatologic / Skin Assessment (not related to wound area) 0 ASSESSMENTS - Ostomy and/or Continence Assessment and Care []  - Incontinence Assessment and Management 0 []  - 0 Ostomy Care Assessment and Management (repouching, etc.) PROCESS - Coordination of Care []  - Simple Patient / Family Education for ongoing care 0 []  - 0 Complex (extensive) Patient / Family Education for ongoing care []  - 0 Staff obtains Programmer, systems, Records, Test Results / Process Orders []  - 0 Staff telephones HHA, Nursing Homes / Clarify orders / etc []  - 0 Routine Transfer to another Facility (non-emergent condition) []  - 0 Routine Hospital Admission (non-emergent condition) []  - 0 New Admissions / Biomedical engineer / Ordering NPWT, Apligraf, etc. []  - 0 Emergency Hospital Admission (emergent condition) PROCESS - Special Needs []  - Pediatric / Minor Patient Management 0 []  - 0 Isolation Patient Management []  - 0 Hearing / Language / Visual special needs []  - 0 Assessment of Community assistance (transportation, D/C planning, etc.) []  - 0 Additional assistance / Altered mentation []  - 0 Support Surface(s) Assessment (bed, cushion, seat, etc.) INTERVENTIONS - Miscellaneous []  - External ear exam 0 []  - 0 Patient Transfer (multiple staff / Civil Service fast streamer / Similar devices) []  - 0 Simple Staple / Suture removal (25 or less) []  - 0 Complex Staple / Suture  removal (26 or more) []  - 0 Hypo/Hyperglycemic Management (do not check if billed separately) []  - 0 Ankle / Brachial Index (ABI) - do not check if billed separately  Has the patient been seen at the hospital within the last three years: Yes Total Score: 0 Level Of Care: ____ [name] ([id number]) Electronic Signature(s) Signed: 12/02/2021 4:17:41 PM By: Carlene Coria RN Entered By: Carlene Coria on 12/02/2021 10:44:14 Therrien, [name] ([id number]) -------------------------------------------------------------------------------- Compression Therapy Details Patient Name: [name] Date of Service: 12/02/2021 10:15 AM Medical Record Number: [id number] Patient Account Number: [id number] Date of Birth/Sex: 01/24/1923 (87 y.o. F) Treating RN: Carlene Coria Primary Care Anakin Varkey: Adrian Prows Other Clinician: Referring Nan Maya: Adrian Prows Treating Sheina Mcleish/Extender: Tito Dine in Treatment: 29 Compression Therapy Performed for Wound Assessment: Wound #7 Right,Lateral Malleolus Performed By: Jake Church, RN Compression Type: Three Layer Post Procedure Diagnosis Same as Pre-procedure Electronic Signature(s) Signed: 12/02/2021 4:17:41 PM By: Carlene Coria RN Entered By: Carlene Coria on 12/02/2021 10:40:51 Bricco, [name] ([id number]) -------------------------------------------------------------------------------- Encounter Discharge Information Details Patient Name: [name] Date of Service: 12/02/2021 10:15 AM Medical Record Number: [id number] Patient Account Number: [id number] Date of Birth/Sex: 1923-01-13 (87 y.o. F) Treating RN: Carlene Coria Primary Care Cellie Dardis: Adrian Prows Other Clinician: Referring Adaisha Campise: Adrian Prows Treating Mansa Willers/Extender: Tito Dine in Treatment: 20 Encounter Discharge Information Items Post Procedure Vitals Discharge Condition: Stable Temperature (F):  98.3 Ambulatory Status: Ambulatory Pulse (bpm): 73 Discharge Destination: Home Respiratory Rate (breaths/min): 18 Transportation: Private Auto Blood Pressure (mmHg): 135/78 Accompanied By: self Schedule Follow-up Appointment: Yes Clinical Summary of Care: Patient Declined Electronic Signature(s) Signed: 12/02/2021 10:48:30 AM By: Carlene Coria RN Entered By: Carlene Coria on 12/02/2021 10:48:30 Daus, [name] ([id number]) -------------------------------------------------------------------------------- Lower Extremity Assessment Details Patient Name: [name] Date of Service: 12/02/2021 10:15 AM Medical Record Number: [id number] Patient Account Number: [id number] Date of Birth/Sex: 04/23/1923 (87 y.o. F) Treating RN: Carlene Coria Primary Care Kalai Baca: Adrian Prows Other Clinician: Referring Tre Sanker: Adrian Prows Treating Cabrina Shiroma/Extender: Tito Dine in Treatment: 29 Edema Assessment Assessed: [Left: No] [Right: No] Edema: [Left: Ye] [Right: s] Calf Left: Right: Point of Measurement: 33 cm From Medial Instep 28 cm Ankle Left: Right: Point of Measurement: 10 cm From Medial Instep 17 cm Vascular Assessment Pulses: Dorsalis Pedis Palpable: [Right:Yes] Electronic Signature(s) Signed: 12/02/2021 4:17:41 PM By: Carlene Coria RN Entered By: Carlene Coria on 12/02/2021 10:20:15 Clift, [name] ([id number]) -------------------------------------------------------------------------------- Multi Wound Chart Details Patient Name: [name] Date of Service: 12/02/2021 10:15 AM Medical Record Number: [id number] Patient Account Number: [id number] Date of Birth/Sex: 03-Nov-1923 (87 y.o. F) Treating RN: Carlene Coria Primary Care Macallister Ashmead: Adrian Prows Other Clinician: Referring Teena Mangus: Adrian Prows Treating Valina Maes/Extender: Tito Dine in Treatment: 29 Vital Signs Height(in): 62 Pulse(bpm): 41 Weight(lbs):  150 Blood Pressure(mmHg): 135/78 Body Mass Index(BMI): 27 Temperature(F): 98.3 Respiratory Rate(breaths/min): 18 Photos: [N/A:N/A] Wound Location: Right, Lateral Malleolus N/A N/A Wounding Event: Gradually Appeared N/A N/A Primary Etiology: Pressure Ulcer N/A N/A Comorbid History: Cataracts, Congestive Heart Failure, N/A N/A Hypertension, Peripheral Arterial Disease, Osteoarthritis, Neuropathy Date Acquired: 05/12/2021 N/A N/A Weeks of Treatment: 29 N/A N/A Wound Status: Open N/A N/A Measurements L x W x D (cm) 0.6x0.7x0.2 N/A N/A Area (cm) : 0.33 N/A N/A Volume (cm) : 0.066 N/A N/A % Reduction in Area: 70.60% N/A N/A % Reduction in Volume: 70.70% N/A N/A Classification: Category/Stage III N/A N/A Exudate Amount: Medium N/A N/A Exudate Type: Serosanguineous N/A N/A Exudate Color: red, brown N/A N/A Granulation Amount: Large (67-100%) N/A N/A Granulation Quality: Red N/A N/A Necrotic Amount: None Present (0%) N/A N/A Exposed Structures: Fat Layer (Subcutaneous Tissue): N/A  N/A Yes Fascia: No Tendon: No Muscle: No Joint: No Bone: No Epithelialization: None N/A N/A Treatment Notes Electronic Signature(s) Signed: 12/02/2021 4:17:41 PM By: Carlene Coria RN Entered By: Carlene Coria on 12/02/2021 10:37:42 Preyer, [name] ([id number]) -------------------------------------------------------------------------------- Brethren Details Patient Name: [name] Date of Service: 12/02/2021 10:15 AM Medical Record Number: [id number] Patient Account Number: [id number] Date of Birth/Sex: 09-12-1923 (87 y.o. F) Treating RN: Carlene Coria Primary Care Noralyn Karim: Adrian Prows Other Clinician: Referring Jing Howatt: Adrian Prows Treating Lyndsey Demos/Extender: Tito Dine in Treatment: 65 Active Inactive Electronic Signature(s) Signed: 12/02/2021 4:17:41 PM By: Carlene Coria RN Entered By: Carlene Coria on 12/02/2021 10:37:32 Robinette,  [name] ([id number]) -------------------------------------------------------------------------------- Pain Assessment Details Patient Name: [name] Date of Service: 12/02/2021 10:15 AM Medical Record Number: [id number] Patient Account Number: [id number] Date of Birth/Sex: 1923/10/03 (87 y.o. F) Treating RN: Carlene Coria Primary Care Shantasia Hunnell: Adrian Prows Other Clinician: Referring Hilda Wexler: Adrian Prows Treating Sevilla Murtagh/Extender: Tito Dine in Treatment: 29 Active Problems Location of Pain Severity and Description of Pain Patient Has Paino No Site Locations Pain Management and Medication Current Pain Management: Electronic Signature(s) Signed: 12/02/2021 4:17:41 PM By: Carlene Coria RN Entered By: Carlene Coria on 12/02/2021 10:10:36 Nyce, [name] ([id number]) -------------------------------------------------------------------------------- Patient/Caregiver Education Details Patient Name: [name] Date of Service: 12/02/2021 10:15 AM Medical Record Number: [id number] Patient Account Number: [id number] Date of Birth/Gender: [birth year] (87 y.o. F) Treating RN: Carlene Coria Primary Care Physician: Adrian Prows Other Clinician: Referring Physician: Adrian Prows Treating Physician/Extender: Tito Dine in Treatment: 26 Education Assessment Education Provided To: Patient Education Topics Provided Wound/Skin Impairment: Methods: Explain/Verbal Responses: State content correctly Electronic Signature(s) Signed: 12/02/2021 4:17:41 PM By: Carlene Coria RN Entered By: Carlene Coria on 12/02/2021 10:46:47 Goffredo, [name] ([id number]) -------------------------------------------------------------------------------- Wound Assessment Details Patient Name: [name] Date of Service: 12/02/2021 10:15 AM Medical Record Number: [id number] Patient Account Number: [id number] Date of Birth/Sex: 01-12-1923 (87 y.o.  F) Treating RN: Carlene Coria Primary Care Kendrik Mcshan: Adrian Prows Other Clinician: Referring Ashtynn Berke: Adrian Prows Treating Evgenia Merriman/Extender: Tito Dine in Treatment: 29 Wound Status Wound Number: 7 Primary Pressure Ulcer Etiology: Wound Location: Right, Lateral Malleolus Wound Open Wounding Event: Gradually Appeared Status: Date Acquired: 05/12/2021 Comorbid Cataracts, Congestive Heart Failure, Hypertension, Weeks Of Treatment: 29 History: Peripheral Arterial Disease, Osteoarthritis, Neuropathy Clustered Wound: No Photos Wound Measurements Length: (cm) 0.6 Width: (cm) 0.7 Depth: (cm) 0.2 Area: (cm) 0.33 Volume: (cm) 0.066 % Reduction in Area: 70.6% % Reduction in Volume: 70.7% Epithelialization: None Tunneling: No Undermining: No Wound Description Classification: Category/Stage III Exudate Amount: Medium Exudate Type: Serosanguineous Exudate Color: red, brown Foul Odor After Cleansing: No Slough/Fibrino No Wound Bed Granulation Amount: Large (67-100%) Exposed Structure Granulation Quality: Red Fascia Exposed: No Necrotic Amount: None Present (0%) Fat Layer (Subcutaneous Tissue) Exposed: Yes Tendon Exposed: No Muscle Exposed: No Joint Exposed: No Bone Exposed: No Treatment Notes Wound #7 (Malleolus) Wound Laterality: Right, Lateral Cleanser Soap and Water Discharge Instruction: Gently cleanse wound with antibacterial soap, rinse and pat dry prior to dressing wounds Peri-Wound Care Jancy, Sprankle [name] ([id number]) Topical Primary Dressing Secondary Dressing Gauze Discharge Instruction: As directed: dry, moistened with saline or moistened with Dakins Solution Secured With Compression Wrap Profore Lite LF 3 Multilayer Compression Bandaging System Discharge Instruction: Apply 3 multi-layer wrap as prescribed. Compression Stockings Add-Ons Electronic Signature(s) Signed: 12/02/2021 4:17:41 PM By: Carlene Coria RN Entered By: Carlene Coria on 12/02/2021 10:19:28 Urey, [name] ([id number]) -------------------------------------------------------------------------------- Vitals  Details Patient Name: DAMARIZ, PAGANELLI. Date of Service: 12/02/2021 10:15 AM Medical Record Number: [id number] Patient Account Number: [id number] Date of Birth/Sex: 1923/05/21 (87 y.o. F) Treating RN: Carlene Coria Primary Care Sriyan Cutting: Adrian Prows Other Clinician: Referring Jevaughn Degollado: Adrian Prows Treating Nona Gracey/Extender: Tito Dine in Treatment: 29 Vital Signs Time Taken: 10:10 Temperature (F): 98.3 Height (in): 62 Pulse (bpm): 73 Weight (lbs): 150 Respiratory Rate (breaths/min): 18 Body Mass Index (BMI): 27.4 Blood Pressure (mmHg): 135/78 Reference Range: 80 - 120 mg / dl Electronic Signature(s) Signed: 12/02/2021 4:17:41 PM By: Carlene Coria RN Entered By: Carlene Coria on 12/02/2021 10:10:27

## 2021-12-08 ENCOUNTER — Encounter: Payer: Medicare Other | Attending: Physician Assistant | Admitting: Physician Assistant

## 2021-12-08 ENCOUNTER — Other Ambulatory Visit: Payer: Self-pay

## 2021-12-08 DIAGNOSIS — G629 Polyneuropathy, unspecified: Secondary | ICD-10-CM | POA: Diagnosis not present

## 2021-12-08 DIAGNOSIS — I890 Lymphedema, not elsewhere classified: Secondary | ICD-10-CM | POA: Insufficient documentation

## 2021-12-08 DIAGNOSIS — L89513 Pressure ulcer of right ankle, stage 3: Secondary | ICD-10-CM | POA: Insufficient documentation

## 2021-12-08 DIAGNOSIS — I872 Venous insufficiency (chronic) (peripheral): Secondary | ICD-10-CM | POA: Insufficient documentation

## 2021-12-08 DIAGNOSIS — I739 Peripheral vascular disease, unspecified: Secondary | ICD-10-CM | POA: Insufficient documentation

## 2021-12-08 NOTE — Progress Notes (Addendum)
NEOMIA, HERBEL ([id number]) Visit Report for 12/08/2021 Chief Complaint Document Details Patient Name: [name], [name]. Date of Service: 12/08/2021 11:00 AM Medical Record Number: [id number] Patient Account Number: [id number] Date of Birth/Sex: 12/04/1923 (88 y.o. F) Treating RN: Carlene Coria Primary Care Provider: Adrian Prows Other Clinician: Referring Provider: Adrian Prows Treating Provider/Extender: Skipper Cliche in Treatment: 29 Information Obtained from: Patient Chief Complaint Right foot ulcer Electronic Signature(s) Signed: 12/08/2021 11:29:01 AM By: Worthy Keeler PA-C Entered By: Worthy Keeler on 12/08/2021 11:29:00 Wallenstein, [name] ([id number]) -------------------------------------------------------------------------------- HPI Details Patient Name: [name] Date of Service: 12/08/2021 11:00 AM Medical Record Number: [id number] Patient Account Number: [id number] Date of Birth/Sex: [birth year] (88 y.o. 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 Dr. Amalia Hailey the podiatrist saw her on 07/20/2017 and sent her to the wound center for possible hyperbaric oxygen therapy. past medical history of peripheral vascular disease, varicose veins, status post appendectomy, basal cell carcinoma excision from the left leg, cholecystectomy, pacemaker placement, right lower extremity angiography done by Dr. dew in March 2017 with placement of a stent. there is also note of a successful ablation of the right small saphenous vein done which was reviewed by ultrasound on 10/24/2016. the  patient had a right small saphenous vein ablation done on 10/20/2016. The patient has never been a smoker. She has been seen by Dr. Corene Cornea dew the vascular surgeon who most recently saw her on 06/15/2017 for evaluation of ongoing problems with right leg swelling. She had a lower extremity arterial duplex examination done(02/13/17) which showed patent distal right superficial femoral artery stent and above-the-knee popliteal stent without evidence of restenosis. The ABI was more than 1.3 on the right and more than 1.3 on the left. This was consistent with noncompressible arteries due to medial calcification. The right great toe pressure and PPG waveforms are within normal limits and the left great toe pressure and PPG waveforms are decreased. he recommended she continue to wear her compression stockings and continue with elevation. She is scheduled to have a noninvasive arterial study in the near future 08/16/2017 -- had a lower extremity arterial duplex examination done which showed patent distal right superficial femoral artery stent and above-the-knee popliteal stent without evidence of restenosis. The ABI was more than 1.3 on the right and more than 1.3 on the left. This was consistent with noncompressible arteries due to medial calcification. The right great toe pressure and PPG waveforms are within normal limits and the left great toe pressure and PPG waveforms are decreased. the x-ray of the right ankle has not yet been done 08/24/2017 -- had a right ankle x-ray -- IMPRESSION:1. No fracture, bone lesion or evidence of osteomyelitis. 2. Lateral soft tissue swelling with a soft tissue ulcer. she has not yet seen the vascular surgeon for review 08/31/17 on evaluation today patient's wound appears to be showing signs of improvement. She still with her appointment with vascular in order to review her results of her vascular study and then determine if any intervention would be recommended at that  time. No fevers, chills, nausea, or vomiting noted at this time. She has been tolerating the dressing changes without complication. 09/28/17 on evaluation today patient's  wound appears to show signs of good improvement in regard to the granulation tissue which is surfacing. There is still a layer of slough covering the wound and the posterior portion is still significantly deeper than the anterior nonetheless there has been some good sign of things moving towards the better. She is going to go back to Dr. dew for reevaluation to ensure her blood flow is still appropriate. That will be before her next evaluation with [place] next week. No fevers, chills, nausea, or vomiting noted at this time. Patient does have some discomfort rated to be a 3-4/10 depending on activity specifically cleansing the wound makes it worse. 10/05/2017 -- the patient was seen by Dr. Lucky Cowboy last week and noninvasive studies showed a normal right ABI with brisk triphasic waveforms consistent with no arterial insufficiency including normal digital pressures. The duplex showed a patent distal right SFA stent and the proximal SFA was also normal. He was pleased with her test and thought she should have enough of perfusion for normal wound healing. He would [name] her back in 6 months time. 12/21/17 on evaluation today patient appears to be doing fairly well in regard to her right lateral ankle wound. Unfortunately the main issue that she is expansion at this point is that she is having some issues with what appears to be some cellulitis in the right anterior shin. She has also been noting a little bit of uncomfortable feeling especially last night and her ankle area. I'm afraid that she made the developing a little bit of an infection. With that being said I think it is in the early stages. 12/28/17 on evaluation today patient's ankle appears to be doing excellent. She's making good progress at this point the cellulitis seems to have improved  after last week's evaluation. Overall she is having no significant discomfort which is excellent news. She does have an appointment with Dr. dew on March 29, 2018 for reevaluation in regard to the stent he placed. She seems to have excellent blood flow in the right lower extremity. 01/19/12 on evaluation today patient's wound appears to be doing very well. In fact she does not appear to require debridement at this point, there's no evidence of infection, and overall from the standpoint of the wound she seems to be doing very well. With that being said I believe that it may be time to switch to different dressing away from the [facility] Dressing she tells me she does have a lot going on her friend actually passed away yesterday and she's also having a lot of issues with her husband this obviously is weighing heavy on her as far as your thoughts and concerns today. 01/25/18 on evaluation today patient appears to be doing fairly well in regard to her right lateral malleolus. She has been tolerating the dressing changes without complication. Overall I feel like this is definitely showing signs of improvement as far as how the overall appearance of the wound is there's also evidence of epithelium start to migrate over the granulation tissue. In general I think that she is progressing nicely as far as the wound is concerned. The only concern she really has is whether or not we can switch to every other week visits in order to avoid having as many appointments as her daughters have a difficult time getting her to her appointments as well as the patient's husband to his he is not doing very well at this point. 02/22/18 on evaluation today patient's right lateral malleolus ulcer  appears to be doing great. She has been tolerating the dressing changes without complication. Overall you making excellent progress at this time. Patient is having no significant discomfort. [name], [name] ([id number]) 03/15/18  on evaluation today patient appears to be doing much more poorly in regard to her right lateral ankle ulcer at this point. Unfortunately since have last seen her her husband has passed just a few days ago is obviously weighed heavily on her her daughter also had surgery well she is with her today as usual. There does not appear to be any evidence of infection she does seem to have significant contusion/deep tissue injury to the right lateral malleolus which was not noted previous when I saw her last. It's hard to tell of exactly when this injury occurred although during the time she was spending the night in the hospital this may have been most likely. 03/22/18 on evaluation today patient appears to actually be doing very well in regard to her ulcer. She did unfortunately have a setback which was noted last week however the good news is we seem to be getting back on track and in fact the wound in the core did still have some necrotic tissue which will be addressed at this point today but in general I'm seeing signs that things are on the up and up. She is glad to hear this obviously she's been somewhat concerned that due to the how her wound digressed more recently. 03/29/18 on evaluation today patient appears to be doing fairly well in regard to her right lower extremity lateral malleolus ulcer. She unfortunately does have a new area of pressure injury over the inferior portion where the wound has opened up a little bit larger secondary to the pressure she seems to be getting. She does tell me sometimes when she sleeps at night that it actually hurts and does seem to be pushing on the area little bit more unfortunately. There does not appear to be any evidence of infection which is good news. She has been tolerating the dressing changes without complication. She also did have some bruising in the left second and third toes due to the fact that she may have bump this or injured it although she has  neuropathy so she does not feel she did move recently that may have been where this came from. Nonetheless there does not appear to be any evidence of infection at this time. 04/12/18 on evaluation today patient's wound on the right lateral ankle actually appears to be doing a little bit better with a lot of necrotic docking tissue centrally loosening up in clearing away. However she does have the beginnings of a deep tissue injury on the left lateral malleolus likely due to the fact we've been trying offload the right as much as we have. I think she may benefit from an assistive soft device to help with offloading and it looks like they're looking at one of the doughnut conditions that wraps around the lower leg to offload which I think will definitely do a good job. With that being said I think we definitely need to address this issue on the left before it becomes a wound. Patient is not having significant pain. 04/19/18 on evaluation today patient appears to be doing excellent in regard to the progress she's made with her right lateral ankle ulcer. The left ankle region which did show evidence of a deep tissue injury seems to be resolving there's little fluid noted underneath and a blister there's  nothing open at this point in time overall I feel like this is progressing nicely which is good news. She does not seem to be having significant discomfort at this point which is also good news. 04/25/18-She is here in follow up evaluation for bilateral lateral malleolar ulcers. The right lateral malleolus ulcer with pale subcutaneous tissue exposure, central area of ulcer with tendon/periosteum exposed. The left lateral malleolus ulcer now with central area of nonviable tissue, otherwise deep tissue injury. She is wearing compression wraps to the left lower extremity, she will place the right lower extremity compression wraps on when she gets home. She will be out of town over the weekend and return next  week and follow-up appointment. She completed her doxycycline this morning 05/03/18 on evaluation today patient appears to be doing very well in regard to her right lateral ankle ulcer in general. At least she's showing some signs of improvement in this regard. Unfortunately she has some additional injury to the left lateral malleolus region which appears to be new likely even over the past several days. Again this determination is based on the overall appearance. With that being said the patient is obviously frustrated about this currently. 05/10/18-She is here in follow-up evaluation for bilateral lateral malleolar ulcers. She states she has purchased offloading shoes/boots and they will arrive tomorrow. She was asked to bring them in the office at next week's appointment so her provider is aware of product being utilized. She continues to sleep on right or left side, she has been encouraged to sleep on her back. The right lateral malleolus ulcer is precariously close to peri-osteum; will order xray. The left lateral malleolus ulcer is improved. Will switch back to santyl; she will follow up next week. 05/17/18 on evaluation today patient actually appears to be doing very well in regard to her malleolus her ulcers compared to last time I saw them. She does not seem to have as much in the way of contusion at this point which is great news. With that being said she does continue to have discomfort and I do believe that she is still continuing to benefit from the offloading/pressure reducing boots that were recommended. I think this is the key to trying to get this to heal up completely. 05/24/18 on evaluation today patient actually appears to be doing worse at this point in time unfortunately compared to her last week's evaluation. She is having really no increased pain which is good news unfortunately she does have more maceration in your theme and noted surrounding the right lateral ankle the left  lateral ankle is not really is erythematous I do not [name] signs of the overt cellulitis on that side. Unfortunately the wounds do not seem to have shown any signs of improvement since the last evaluation. She also has significant swelling especially on the right compared to previous some of this may be due to infection however also think that she may be served better while she has these wounds by compression wrapping versus continuing to use the Juxta-Lite for the time being. Especially with the amount of drainage that she is experiencing at this point. No fevers, chills, nausea, or vomiting noted at this time. 05/31/18 on evaluation today patient appears to actually be doing better in regard to her right lateral lower extremity ulcer specifically on the malleolus region. She has been tolerating the antibiotic without complication. With that being said she still continues to have issues but a little bit of redness although nothing like  she what she was experiencing previous. She still continues to pressure to her ankle area she did get the problem on offloading boots unfortunately she will not wear them she states there too uncomfortable and she can't get in and out of the bed. Nonetheless at this point her wounds seem to be continually getting worse which is not what we want I'm getting somewhat concerned about her progress and how things are going to proceed if we do not intervene in some way shape or form. I therefore had a very lengthy conversation today about offloading yet again and even made a specific suggestion for switching her to a memory foam mattress and even gave the information for a specific one that they could look at getting if it was something that they were interested in considering. She does not want to be considered for a hospital bed air mattress although honestly insurance would not cover it that she does not have any wounds on her trunk. 06/14/18 on evaluation today both wounds  over the bilateral lateral malleolus her ulcers appear to be doing better there's no evidence of pressure injury at this point. She did get the foam mattress for her bed and this does seem to have been extremely beneficial for her in my pinion. Her daughter states that she is having difficulty getting out of bed because of how soft it is. The patient also relates this to be. Nonetheless I do feel like she's actually doing better. Unfortunately right after and around the time she was getting the mattress she also sustained a fall when she got up to go pick up the phone and ended up injuring her right elbow she has [address] sut[address] place. We are not caring for this currently although home health is going to be taking the sutures out shortly. Nonetheless this may be something that we need to evaluate going forward. It depends on how well it has or has not healed in the end. She also recently saw an orthopedic specialist for an injection in the right shoulder just before her fall unfortunately the fall seems to have worsened her pain. 06/21/18 on evaluation today patient appears to be doing about the same in regard to her lateral malleolus ulcers. Both appear to be just a little bit deeper but again we are clinging away the necrotic and dead tissue which I think is why this is progressing towards a deeper realm as opposed [name], [name]. ([id number]) to improving from my measurement standpoint in that regard. Nonetheless she has been tolerating the dressing changes she absolutely hates the memory foam mattress topper that was obtained for her nonetheless I do believe this is still doing excellent as far as taking care of excess pressure in regard to the lateral malleolus regions. She in fact has no pressure injury that I [name] whereas in weeks past it was week by week I was constantly seeing new pressure injuries. Overall I think it has been very beneficial for her. 07/03/18; patient arrives in my clinic  today. She has deep punched out areas over her bilateral lateral malleoli. The area on the right has some more depth. We spent a lot of time today talking about pressure relief for these areas. This started when her daughter asked for a prescription for a memory foam mattress. I have never written a prescription for a mattress and I don't think insurances would pay for that on an ordinary bed. In any case he came up that she has foam  boots that she refuses to wear. I would suggest going to these before any other offloading issues when she is in bed. They say she is meticulous about offloading this the rest of the day 07/10/18- She is seen in follow-up evaluation for bilateral, lateral malleolus ulcers. There is no improvement in the ulcers. She has purchased and is sleeping on a memory foam mattress/overlay, she has been using the offloading boots nightly over the past week. She has a follow up appointment with vascular medicine at the end of October, in my opinion this follow up should be expedited given her deterioration and suboptimal TBI results. We will order plain film xray of the left ankle as deeper structures are palpable; would consider having MRI, regardless of xray report(s). The ulcers will be treated with iodoflex/iodosorb, she is unable to safely change the dressings daily with santyl. 07/19/18 on evaluation today patient appears to be doing in general visually well in regard to her bilateral lateral malleolus ulcers. She has been tolerating the dressing changes without complication which is good news. With that being said we did have an x-ray performed on 07/12/18 which revealed a slight loosen [name] in the lateral portion of the distal left fibula which may represent artifact but underline lytic destruction or osteomyelitis could not be excluded. MRI was recommended. With that being said we can [name] about getting the patient scheduled for an MRI to further evaluate this area. In fact we have  that scheduled currently for August 20 19,019. 07/26/18 on evaluation today patient's wound on the right lateral ankle actually appears to be doing fairly well at this point in my pinion. She has made some good progress currently. With that being said unfortunately in regard to the left lateral ankle ulcer this seems to be a little bit more problematic at this time. In fact as I further evaluated the situation she actually had bone exposed which is the first time that's been the case in the bone appear to be necrotic. Currently I did review patient's note from Dr. Bunnie Domino office with [facility]. He stated that ABI was 1.26 on the right and 0.95 on the left with good waveforms. Her perfusion is stable not reduced from previous studies and her digital waveforms were pretty good particularly on the right. His conclusion upon review of the note was that there was not much she could do to improve her perfusion and he felt she was adequate for wound healing. His suggestion was that she continued to [name] [place] and consider a synthetic skin graft if there was no underlying infection. He plans to [name] her back in six months or as needed. 08/01/18 on evaluation today patient appears to be doing better in regard to her right lateral ankle ulcer. Her left lateral ankle ulcer is about the same she still has bone involvement in evidence of necrosis. There does not appear to be evidence of infection at this time On the right lateral lower extremity. I have started her on the Augmentin she picked this up and started this yesterday. This is to get her through until she sees infectious disease which is scheduled for 08/12/18. 08/06/18 on evaluation today patient appears to be doing rather well considering my discussion with patient's daughter at the end of last week. The area which was marked where she had erythema seems to be improved and this is good news. With that being said overall the patient seems  to be making good improvement when it  [name] to the overall appearance of the right lateral ankle ulcer although this has been slow she at least is coming around in this regard. Unfortunately in regard to the left lateral ankle ulcer this is osteomyelitis based on the pathology report as well is bone culture. Nonetheless we are still waiting CT scan. Unfortunately the MRI we originally ordered cannot be performed as the patient is a pacemaker which I had overlooked. Nonetheless we are working on the CT scan approval and scheduling as of now. She did go to the hospital over the weekend and was placed on IV Cefzo for a couple of days. Fortunately this seems to have improved the erythema quite significantly which is good news. There does not appear to be any evidence of worsening infection at this time. She did have some bleeding after the last debridement therefore I did not perform any sharp debridement in regard to left lateral ankle at this point. Patient has been approved for a snap vac for the right lateral ankle. 08/14/18; the patient with wounds over her bilateral lateral malleoli. The area on the right actually looks quite good. Been using a snap back on this area. Healthy granulation and appears to be filling in. Unfortunately the area on the left is really problematic. 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 Also noted to have cellulitis. She saw Dr. Novella Olive of infectious disease today and was put on linezolid. We are able to verify this with her pharmacy. She is completed the Augmentin that she was already on. We've been using Iodoflex to this area 08/23/18 on evaluation today patient's wounds both actually appear to be doing better compared to my prior evaluations. Fortunately she showing signs of good improvement in regard to the overall wound status especially where were using the snap vac on the right. In  regard to left lateral malleolus the wound bed actually appears to be much cleaner than previously noted. I do not feel any phone directly probed during evaluation today and though there is tendon noted this does not appear to be necrotic it's actually fairly good as far as the overall appearance of the tendon is concerned. In general the wound bed actually appears to be doing significantly better than it was previous. Patient is currently in the care of [name] and I did review that note today. He actually has her on two weeks of linezolid and then following the patient will be on 1-2 months of Keflex. That is the plan currently. She has been on antibiotics therapy as prescribed by myself initially starting on July 30, 2018 and has been on that continuously up to this point. 08/30/18 on evaluation today patient actually appears to be doing much better in regard to her right lateral malleolus ulcer. She has been tolerating the dressing changes specifically the snap vac without complication although she did have some issues with the seal currently. Apparently there was some trouble with getting it to maintain over the past week past Sunday. Nonetheless overall the wound appears better in regard to the right lateral malleolus region. In regard to left lateral malleolus this actually show some signs of additional granulation although there still tendon noted in the base of the wound this appears to be healthy not necrotic in any way whatsoever. We are considering potentially using a snap vac for the left lateral malleolus as well the product wrap from [employer], [employer], was present in  the clinic today we're going to [name] this patient I did have her come in with me after obtaining consent from the patient and her daughter in order to look at the wound and [name] if there's any recommendation one way or another as to whether or not they felt the snapback could be beneficial for the left lateral malleolus region.  But the conclusion was that it might be but that this is definitely a little bit deeper wound than what traditionally would be utilized for a snap vac. 09/06/18 on evaluation today patient actually appears to be doing excellent in my pinion in regard to both ankle ulcers. She has been tolerating the dressing changes without complication which is great news. Specifically we have been using the snap vac. In regard to the right ankle I'm not even sure that this is going to be necessary for today and following as the wound has filled in quite nicely. In regard to the left ankle I do believe [name], [name] ([id number]) that we're seeing excellent epithelialization from the edge as well as granulation in the central portion the tendon is still exposed but there's no evidence of necrotic bone and in general I feel like the patient has made excellent progress even compared to last week with just one week of the snap vac. 09/11/18; this is a patient who has wounds on her bilateral lateral malleoli. Initially both of these were deep stage IV wounds in the setting of chronic arterial insufficiency. She has been revascularized. As I understand think she been using snap vacs to both of these wounds however the area on the right became more superficial and currently she is only using it on the left. Using silver collagen on the right and silver collagen under the back on the left I believe 09/19/18 on evaluation today patient actually appears to be doing very well in regard to her lateral malleolus or ulcers bilaterally. She has been tolerating the dressing changes without complication. Fortunately there does not appear to be any evidence of infection at this time. Overall I feel like she is improving in an excellent manner and I'm very pleased with the fact that everything seems to be turning towards the better for her. This has obviously been a long road. 09/27/18 on evaluation today patient actually appears  to be doing very well in regard to her bilateral lateral malleolus ulcers. She has been tolerating the dressing changes without complication. Fortunately there does not appear to be any evidence of infection at this time which is also great news. No fevers, chills, nausea, or vomiting noted at this time. Overall I feel like she is doing excellent with the snap vac on the left malleolus. She had 40 mL of fluid collection over the past week. 10/04/18 on evaluation today patient actually appears to be doing well in regard to her bilateral lateral malleolus ulcers. She continues to tolerate the dressing changes without complication. One issue that I [name] is the snap vac on the left lateral malleolus which appears to have sealed off some fluid underlying this area and has not really allowed it to heal to the degree that I would like to [name]. For that reason I did suggest at this point we may want to pack a small piece of packing strip into this region to allow it to more effectively wick out fluid. 10/11/18 in general the patient today does not feel that she has been doing very well. She's been a little bit lethargic and  subsequently is having bodyaches as well according to what she tells me today. With that being said overall she has been concerned with the fact that something may be worsening although to be honest her wounds really have not been appearing poorly. She does have a new ulcer on her left heel unfortunately. This may be pressure related. Nonetheless it seems to me to have potentially started at least as a blister I do not [name] any evidence of deep tissue injury. In regard to the left ankle the snap vac still seems to be causing the ceiling off of the deeper part of the wound which is in turn trapping fluid. I'm not extremely pleased with the overall appearance as far as progress from last week to this week therefore I'm gonna discontinue the snap vac at this point. 10/18/18 patient unfortunately  this point has not been feeling well for the past several days. She was seen by Grayland Ormond her primary care provider who is a Librarian, academic at [facility]. Subsequently she states that she's been very weak and generally feeling malaise. No fevers, chills, nausea, or vomiting noted at this time. With that being said bloodwork was performed at the PCP office on the 11th of this month which showed a white blood cell count of 10.7. This was repeated today and shows a white blood cell count of 12.4. This does show signs of worsening. Coupled with the fact that she is feeling worse and that her left ankle wound is not really showing signs of improvement I feel like this is an indication that the osteomyelitis is likely exacerbating not improving. Overall I think we may also want to check her C-reactive protein and sedimentation rate. Actually did call [facility] office this afternoon while the patient was in the office here with me. Subsequently based on the findings we discussed treatment possibilities and I think that it is appropriate for [place] to go ahead and initiate treatment with doxycycline which I'm going to do. Subsequently he did agree to [name] about adding a CRP and sedimentation rate to her orders. If that has not already been drawn to where they can run it they will contact the patient she can come back to have that check. They are in agreement with plan as far as the patient and her daughter are concerned. Nonetheless also think we need to get in touch with Dr. Henreitta Leber office to [name] about getting the patient scheduled with him as soon as possible. 11/08/18 on evaluation today patient presents for follow-up concerning her bilateral foot and ankle ulcers. I did do an extensive review of her chart in epic today. Subsequently she was seen by [name] he did initiate Cefepime IV antibiotic therapy. Subsequently she had some issues with her PICC line this had to be removed because it  was coiled and then replaced. Fortunately that was now settled. Unfortunately she has continued have issues with her left heel as well as the issues that she is experiencing with her bilateral lateral malleolus regions. I do believe however both areas seem to be doing a little bit better on evaluation today which is good news. No fevers, chills, nausea, or vomiting noted at this time. She actually has an angiogram schedule with Dr. dew on this coming Monday, November 11, 2018. Subsequently the patient states that she is feeling much better especially than what she was roughly 2 weeks ago. She actually had to cancel an appointment because she was feeling so poorly. No fevers,  chills, nausea, or vomiting noted at this time. 11/15/18 on evaluation today patient actually is status post having had her angiogram with Dr. dew Monday, four days ago. 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 He had to go and work on several areas of the vasculature fortunately he was able to obtain no more than a 30% residual stenosis throughout post procedure. I reviewed this note today. I think this will definitely help with healing at this time. Fortunately there does not appear to be any signs of infection and I do feel like ratio already has a better appearance to it. 11/22/18 upon evaluation today patient actually appears to be doing very well in regard to her wounds in general. The right lateral malleolus looks excellent the heel looks better in the left lateral malleolus also appears to be doing a little better. With that being said the right second toe actually appears to be open and training we been watching this is been dry and stable but now is open. 12/03/2018 Seen today for follow-up and management of multiple bilateral lower extremity wounds. New pressure injury of the great toe which is closed at this time. Wound of the right distal second toe appears larger today with deep undermining  and a pocket of fluid present within the undermining region. Left and right malleolus is wounds are stable today with no signs and symptoms of infection.Denies any needs or concerns during exam today. 12/13/18 on evaluation today patient appears to be doing somewhat better in regard to her left heel ulcer. She also seems to be completely healed in regard to the right lateral malleolus ulcer. The left malleolus ulcer is smaller what unfortunately the wounds which are new over the first and second toes of the right foot are what are most concerning at this point especially the second. Both areas did require sharp debridement today. 12/20/18 on evaluation today patient's wound actually appears to be doing better in regard to left lateral ankle and her right lateral ankle continues to remain healed. The hill ulcer on the left is improved. She does have improvement noted as well in regard to both toe ulcers. Overall I'm very pleased in this regard. No fevers, chills, nausea, or vomiting noted at this time. 12/23/18 on evaluation today patient is seen after she had her toenails trimmed at the podiatrist office due to issues with her right great toe. There was what appeared to be dark eschar on the surface of the wound which had her in the podiatrist concerned. Nonetheless as I remember that during the last office visit I had utilize silver nitrate of this area I was much less concerned about the situation. Subsequently I was able to clean off much of this tissue without any complication today. This does not appear to show any signs of infection and actually look somewhat better Cromwell, [name] ([id number]) compared to last time post debridement. Her second toe on the right foot actually had callous over and there did appear still be some fluid underneath this that would require debridement today. 12/27/18 on evaluation today patient actually appears to be showing signs of improvement at all locations. Even  the left lateral ankle although this is not quite as great as the other sites. Fortunately there does not appear to be any signs of infection at this time and both of her toes on the right foot seem to be showing signs of improvement which is good news and very pleased  in this regard. 01/03/19 on evaluation today patient appears to be doing better for the most part in regard to her wounds in particular. There does not appear to be any evidence of infection at this time which is good news. Fortunately there is no sign of really worsening anywhere except for the right great toe which she does have what appears to be a bruise/deep tissue injury which is very superficial and already resolving. I'm not sure where this came from I questioned her extensively and she does not recall what may have happened with this. Other than that the patient seems to be doing well even the left lateral ankle ulcer looks good and is getting smaller. 01/10/19 on evaluation today patient appears to be doing well in regard to her left heel wound and both of her toe wounds. Overall I feel like there is definitely improvement here and I'm happy in that regard. With that being said unfortunately she is having issues with the left lateral malleolus ulcer which unfortunately still has a lot of depth to it. This is gonna be a very difficult wound for [place] to be able to truly get to heal. I may want to consider some type of skin substitute to [name] if this would be of benefit for her. I'll discuss this with her more the next visit most likely. This was something I thought about more at the end of the visit when I was Artie out of the room and the patient had been discharged. 01/17/19 on evaluation today patient appears to be doing very well in regard to her wounds in general. She's been making excellent progress at this time. Fortunately there's no sign of infection at this time either. No fevers, chills, nausea, or vomiting noted at this  time. The biggest issue is still her left lateral malleolus where it appears to be doing well and is getting smaller but still shows a small corner where this is deeper and goes down into what appears to be the joint space. Nonetheless this is taking much longer to heal although it still looks better in smaller than previous evaluations. 01/24/19 on evaluation today patient's wounds actually appear to be doing rather well in general overall. She did require some sharp debridement in regard to the right great toe but everything else appears to be doing excellent no debridement was even necessary. No fevers, chills, nausea, or vomiting noted at this time. 01/31/19 on evaluation today patient actually appears to be doing much better in regard to her left foot wound on the heel as well as the ankle. The right great toe appears to be a little bit worse today this had callous over and trapped a lot of fluid underneath. Fortunately there's no signs of infection at any site which is great news. 02/07/19 on evaluation today patient actually appears to be doing decently well in regard to all of her ulcers at this point. No sharp debridement was required she is a little bit of hyper granulation in regard to the left lateral ankle as well as the left heel but the hill itself is almost completely healed which is excellent news. Overall been very pleased in this regard. 02/14/19 on evaluation today patient actually appears to be doing very well in regard to her ulcers on the right first toe, left lateral malleolus, and left heel. In fact the heel is almost completely healed at this point. The patient does not show any signs of infection which is good news. Overall very  pleased with how things have progressed. 04/18/19 Telehealth Evaluation During the COVID-19 National Emergency: Verbal Consent: Obtained from patient Allergies: reviewed and the active list is current. Medication changes: patient has no current  medication changes. COVID-19 Screening: 1. Have you traveled internationally or on a cruise ship in the last 14 dayso No 2. Have you had contact with someone with or under investigation for COVID-19o No 3. Have you had a fever, cough, sore throat, or experiencing shortness of breatho No on evaluation today actually did have a visit with this patient through a telehealth encounter with her home health nurse. Subsequently it was noted that the patient actually appears to be doing okay in regard to her wounds both the right great toe as well as the left lateral malleolus have shown signs of improvement although this in your theme around the left lateral malleolus there eschar coverings for both locations. The question is whether or not they are actually close and whether or not home health needs to discharge the patient or not. Nonetheless my concern is this point obviously is that without actually seeing her and being able to evaluate this directly I cannot ensure that she is completely healed which is the question that I'm being asked. 04/22/19 on evaluation today patient presents for her first evaluation since last time I saw her which was actually February 14, 2019. I did do a telehealth visit last week in which point it was questionable whether or not she may be healed and had to bring her in today for confirmation. With that being said she does seem to be doing quite well at this point which is good news. There does not appear to be any drainage in the deed I believe her wounds may be healed. Readmission: 09/04/2019 on evaluation today patient appears to be doing unfortunately somewhat more poorly in regard to her left foot ulcer secondary to a wound that began on 08/21/2019 at least when she first noticed this. Fortunately she has not had any evidence of active infection at this time. Systemically. I also do not necessarily [name] any evidence of infection at the blister/wound site on the first  metatarsal head plantar aspect. This almost appears to be something that may have just rubbed inappropriately causing this to breakdown. They did not want a wait too long to come in to be seen as again she had significant issues in the past with wounds that took quite a while to heal in fact it was close to 2 years. Nonetheless this does not appear to be quite that bad but again we do need to remove some of the necrotic tissue from the surface of the wound to tell exactly the extent. She does not appear to have any significant arterial disease at this point and again her last ABIs and TBI's are recorded above in the alert section her left ABI was 1.27 with a TBI of 0.72 to the right ABI 1.08 with a TBI of 0.39. Other than this the patient has been doing quite well since I last saw her and that was in May 2020. 09/11/2019 on evaluation today patient appeared to be doing very well with regard to her plantar foot ulcer on the left. In fact this appears to be almost completely healed which is awesome. That is after just 1 week of intervention. With that being said there is no signs of active infection at this time. [name], [name] ([id number]) 09/18/2019 on evaluation today patient actually appears to be doing  excellent in fact she is completely healed based on what I am seeing at this point. Fortunately there is no signs of active infection at this time and overall patient is very pleased to hear that this area has healed so quickly. Readmission: 05/13/2021 upon evaluation today this patient presents for reevaluation here in the clinic. This is a wound that actually we previously took care of. She had 1 on the right ankle and the left the left turned out to be be harder due to to heal but nonetheless is doing great at this point as the right that has reopened and it was noted first just several weeks ago with a scab over it and came off in just the past few days. Fortunately there does not appear to  be any obvious evidence of significant active infection at this time which is great news. No fevers, chills, nausea, vomiting, or diarrhea. 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 There does not appear to be any signs of this interfering in any way with her wound. She does have swelling we previously had compression socks for her ordered but again it does not look like she wears these on a regular basis by any means. 05/26/2021 upon evaluation today patient appears to be doing well with regard to her wound which is actually showing signs of excellent improvement. There does not appear to be any signs of active infection which is great news and overall very pleased with where things stand today. No fevers, chills, nausea, vomiting, or diarrhea. 06/02/2021 upon evaluation today patient's wound actually showing signs of excellent improvement. Fortunately there does not appear to be any signs of active infection which is great news. I think the patient is making good progress with regard to her wounds in general. 06/09/2021 upon evaluation today patient appears to be doing excellent in regard to her wounds currently. Fortunately there does not appear to be any signs of active infection which is great news. No fevers, chills, nausea, vomiting, or diarrhea. Overall extremely pleased with where things stand today. I think the patient is making excellent progress. 06/16/2021 upon evaluation today patient appears to be doing well in regard to her wound. This is going require little bit of debridement today and that was discussed with the patient. Otherwise she seems to be doing quite well and I am actually very pleased with where things stand at this point. No fevers, chills, nausea, vomiting, or diarrhea. 06/23/2021 upon evaluation today patient appears to be doing well with regard to her wounds. She has been tolerating the dressing changes without complication.  Fortunately there does not appear to be any evidence of infection and she has not had air in her home which she actually lives at an assisted living that got fixed this morning. With that being said because of that her wrap has been extremely hot and bothersome for her over the past week. 06/30/2021 upon evaluation today patient is actually making excellent progress in regard to her ankle ulcer. She has been tolerating the dressing changes without complication and overall extremely pleased with where things stand there does not appear to be any evidence of active infection which is great news. No fevers, chills, nausea, vomiting, or diarrhea. 07/07/21 upon evaluation today patients and culture on the right actually appears to be doing quite well. There does not appear to be any signs of infection and overall very pleased with where things stand today. No fevers,  chills, nausea, or vomiting noted at this time. 07/14/2021 unfortunately the patient today has some evidence of deep tissue injury and pressure getting to the ankle region. Again I am not exactly sure what is going on here but this is very similar to issues that we have had in the past. I explained to the patient that she needs to be very mindful of exactly what is happening I think sleeping in bed is probably the main issue here although there could be other culprits I am not sure what else would potentially lead to this kind of a problem for her. 07/21/2021 upon evaluation today patient's wound actually showing signs of improvement compared to last week. Fortunately there does not appear to be any signs of active infection which is great news and overall very pleased with where things stand in that regard. With that being said I do believe that she is continuing to show signs of overall of getting better although I think this is still basically about what we were 2 weeks ago due to the worsening and now improvement. 07/28/2021 upon evaluation  today patient appears to be doing well with regard to her wound. She does have some slough buildup on the surface of the wound which I would have to manage today. Fortunately there is no sign of active infection at this time. No fevers, chills, nausea, vomiting, or diarrhea. 08/04/2021 upon evaluation today patient appears to be doing about the same in regard to her wound. To be perfectly honest I am beginning to be a little bit concerned about the overall appearance of the wound bed. I do think possibly taking a sample right around the margin of the wound could be beneficial for her as far as identifying anything such as an inflammatory process or to be honest even a skin tag cancer type process that may be of concern here. Fortunately there does not appear to be any evidence of active infection at this time which is great news she is not having any pain also great news. 08/11/2021 upon evaluation today patient appears to be doing well with regard to her wound. The good news is I did review her biopsy results and it showed some inflammatory mixed findings but nothing that appeared to be malignant which is great news. Overall this is more of a chronic venous stasis type issue which again is more what we have been treating. Nonetheless I just wanted to make sure before going forward that there was not anything more untoward going on at this point. 08/18/2021 upon evaluation today patient appears to be doing well with regard to her ankle ulcer. Fortunately there does not appear to be any signs of active infection at this time which is great overall wound is dramatically improved compared to last week. Since last week I have actually placed her on doxycycline and subsequently this is a good option as far as the findings are concerned at this point. I do believe that the positive result of MRSA is definitely something that needed to be addressed and the good news is The doxycycline is doing a good job of doing  this. the doxycycline is doing that. There does not appear to be any evidence of active infection systemically which is great news. 08/25/2021 upon evaluation today patient appears to be doing well with regard to her wound. I feel like we are finally get back on track as far as healing is concerned I am much happier with the overall appearance today. I  do think that she is tolerating the dressing changes without complication which is great news. We have been using Hydrofera Blue which I think is a good option. The good news is she is also doing great in regard to her compression sock on the left which is a zipper compression that seems to be doing a great job keeping her edema under good control. 09/01/2021 upon evaluation today patient appears to be doing well with regard to her wound. Infection seems to be under much better control which is great news and very pleased in that regard. Fortunately there does not appear to be any signs of infection currently. [name], [name] ([id number]) 09/08/2021 upon evaluation today patient actually appears to be making good progress in regard to her wound. She has been tolerating the dressing changes without complication. Fortunately there does not appear to be any evidence of active infection at this time which is great news as well. No fevers, chills, nausea, vomiting, or diarrhea. 09/22/2021 upon evaluation today patient appears to be doing well with regard to her wound although is very slow to heal. We have not looked into Apligraf yet I think that is something that we should [name] about doing. 09/29/2021 upon evaluation today patient's wound is actually showing signs of doing about the same. I am not seeing any evidence of worsening but also no significant evidence of improvement. We did gain approval for the organogenesis products all except for Apligraf as covered by her insurance. With that being said I do think that we can go ahead and proceed with the  NuShield if the patient and her family in agreement of the plan I discussed that with him today she does have a 20% coinsurance which we also discussed. 10/06/2021 upon evaluation today patient appears to unfortunately be doing a little bit worse she appears to be infected based on what I am seeing. Fortunately there does not appear to be any signs of active infection at this time which is great news. Unfortunately it does appear to be some evidence of around the wound edge indicated by way of erythema and warmth as well as redness 10/13/2021 upon evaluation today patient actually appears to be doing excellent in regard to her ankle ulcer compared to what it was. 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 I think were fine to go ahead and apply the NuShield today. 10/20/2021 upon evaluation today patient appears to be doing excellent in fact the NuShield seems to have done all some for her thus far. I am actually very pleased with where we stand and overall I think that she is making great progress. There is no evidence of active infection at this time. 11/23; patient presents for follow-up. She has no issues or complaints today. She denies signs of infection. She reports stability in her wound healing. 11/03/2021 upon evaluation today patient appears to be doing well with regard to her wounds. She has been tolerating the dressing changes without complication. Fortunately there is no signs of active infection at this time. 11/10/2021 upon evaluation today patient appears to be doing well with regard to her right ankle which is actually showing signs of improvement with the NuShield I am very pleased. Subsequently in regards to the left ankle this appears to be doing okay with no  evidence of issue here  either. With that being said she has a significant contusion on the left leg from having fallen 2 days ago. She tells me that she was using her walker going to the closet and then when she got to the closet turned around to get something out at which point she fell according to the story. Nonetheless she does have a hematoma just below her knee on the anterior portion of her shin. My hope is that this will not open until wound although we do need I think Some compression over it also think that we need to have her use an ice as well to help with the swelling and prevent this from getting worse. The last thing she needs is a wound opened up here. 11/17/2021 upon evaluation patient's right ankle actually showing signs of improvement based on what I [name] currently there is a lot of new skin growth coming in which is great news. Nonetheless I do feel like that the patient is showing improvement as well in regard to her left leg with the use of the Tubigrip it swollen but not as bruised as what I would have expected after what I was seeing last week. Nonetheless I do think doubling up on the Tubigrip would probably be beneficial to try to keep some of the edema under better control here. 11/24/2021 upon evaluation today patient appears to be doing excellent in regard to her wound. This is actually measuring significantly smaller which is great news. Fortunately I do not [name] any signs of active infection locally nor systemically at this point. Overall I am very pleased with how the NuShield is doing. 12/30; wound bed looks healthy however not much change in overall wound volume. We applied Nushield again today in the standard fashion 12/08/2021 upon evaluation today patient's wound actually showing signs of good improvement and I am actually very pleased with where we stand today as well. I do not [name] any evidence of active infection locally nor systemically which is great news. Unfortunately she has been  having some issues with stomach upset and diarrhea today. Electronic Signature(s) Signed: 12/09/2021 10:51:06 AM By: Worthy Keeler PA-C Entered By: Worthy Keeler on 12/09/2021 10:51:05 Lussier, [name] ([id number]) -------------------------------------------------------------------------------- Physical Exam Details Patient Name: [name] Date of Service: 12/08/2021 11:00 AM Medical Record Number: [id number] Patient Account Number: [id number] Date of Birth/Sex: February 19, 1923 (88 y.o. F) Treating RN: Carlene Coria Primary Care Provider: Adrian Prows Other Clinician: Referring Provider: Adrian Prows Treating Provider/Extender: Skipper Cliche in Treatment: 7 Constitutional Well-nourished and well-hydrated in no acute distress. Respiratory normal breathing without difficulty. Psychiatric this patient is able to make decisions and demonstrates good insight into disease process. Alert and Oriented x 3. pleasant and cooperative. Notes Upon inspection patient's wound bed actually showed signs of good granulation and epithelization at this point. Fortunately there does not appear to be any evidence of active infection locally nor systemically at this point and overall I think that the wound is measuring better and looking quite nice as far as the healing is concerned I am very pleased. Electronic Signature(s) Signed: 12/09/2021 10:51:39 AM By: Worthy Keeler PA-C Entered By: Worthy Keeler on 12/09/2021 10:51:39 Heppler, [name] ([id number]) -------------------------------------------------------------------------------- Physician Orders Details Patient Name: [name] Date of Service: 12/08/2021 11:00 AM Medical Record Number: [id number] Patient Account Number: [id number] Date of Birth/Sex: 02-25-1923 (88 y.o. F) Treating RN: Carlene Coria Primary Care Provider: Adrian Prows Other Clinician: Referring Provider:  FItzgerald, Shanon Brow Treating  Provider/Extender: Skipper Cliche in Treatment: 29 Verbal / Phone Orders: No Diagnosis Coding Follow-up Appointments o Return Appointment in 1 week. Bathing/ Shower/ Hygiene o May shower with wound dressing protected with water repellent cover or cast protector. Edema Control - Lymphedema / Segmental Compressive Device / Other o Optional: One layer of unna paste to top of compression wrap (to act as an anchor). - and at toes o Patient to wear own compression stockings. Remove compression stockings every night before going to bed and put on every morning when getting up. - left leg o Elevate, Exercise Daily and Avoid Standing for Long Periods of Time. o Elevate legs to the level of the heart and pump ankles as often as possible o Elevate leg(s) parallel to the floor when sitting. Wound Treatment Wound #7 - Malleolus Wound Laterality: Right, Lateral Cleanser: Soap and Water 1 x Per Week/30 Days Discharge Instructions: Gently cleanse wound with antibacterial soap, rinse and pat dry prior to dressing wounds Primary Dressing: Prisma 4.34 (in) 1 x Per Week/30 Days Discharge Instructions: Moisten w/normal saline or sterile water; Cover wound as directed. Do not remove from wound bed. Secondary Dressing: Gauze 1 x Per Week/30 Days Discharge Instructions: As directed: dry, moistened with saline or moistened with Dakins Solution Compression Wrap: Profore Lite LF 3 Multilayer Compression Bandaging System 1 x Per Week/30 Days Discharge Instructions: Apply 3 multi-layer wrap as prescribed. Electronic Signature(s) Signed: 12/09/2021 4:33:01 PM By: Carlene Coria RN Signed: 12/09/2021 5:21:10 PM By: Worthy Keeler PA-C Entered By: Carlene Coria on 12/08/2021 11:30:59 Lorey, [name] ([id number]) -------------------------------------------------------------------------------- Problem List Details Patient Name: [name] Date of Service: 12/08/2021 11:00 AM Medical Record  Number: [id number] Patient Account Number: [id number] Date of Birth/Sex: 07/04/1923 (88 y.o. 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, stage 3 05/13/2021 No Yes I89.0 Lymphedema, not elsewhere classified 05/13/2021 No Yes I87.2 Venous insufficiency (chronic) (peripheral) 05/13/2021 No Yes Z95.0 Presence of cardiac pacemaker 05/13/2021 No Yes Z79.01 Long term (current) use of anticoagulants 05/13/2021 No Yes Inactive Problems ICD-10 Code Description Active Date Inactive Date S80.12XA Contusion of left lower leg, initial encounter 11/10/2021 11/10/2021 Resolved Problems Electronic Signature(s) Signed: 12/08/2021 11:28:55 AM By: Worthy Keeler PA-C Entered By: Worthy Keeler on 12/08/2021 11:28:54 Nephew, [name] ([id number]) -------------------------------------------------------------------------------- Progress Note Details Patient Name: [name] Date of Service: 12/08/2021 11:00 AM Medical Record Number: [id number] Patient Account Number: [id number] Date of Birth/Sex: 09-21-1923 (88 y.o. 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 Dr. Amalia Hailey the podiatrist saw her on 07/20/2017 and sent her to the wound center for possible hyperbaric oxygen therapy. past medical history  of peripheral vascular disease, varicose veins, status post appendectomy, basal cell carcinoma excision from the left leg, cholecystectomy, pacemaker placement, right lower extremity angiography done by Dr. dew in March 2017 with placement of a stent. there is also note of a successful ablation of the right small saphenous vein done  which was reviewed by ultrasound on 10/24/2016. the patient had a right small saphenous vein ablation done on 10/20/2016. The patient has never been a smoker. She has been seen by Dr. Corene Cornea dew the vascular surgeon who most recently saw her on 06/15/2017 for evaluation of ongoing problems with right leg swelling. She had a lower extremity arterial duplex examination done(02/13/17) which showed patent distal right superficial femoral artery stent and above-the-knee popliteal stent without evidence of restenosis. The ABI was more than 1.3 on the right and more than 1.3 on the left. This was consistent with noncompressible arteries due to medial calcification. The right great toe pressure and PPG waveforms are within normal limits and the left great toe pressure and PPG waveforms are decreased. he recommended she continue to wear her compression stockings and continue with elevation. She is scheduled to have a noninvasive arterial study in the near future 08/16/2017 -- had a lower extremity arterial duplex examination done which showed patent distal right superficial femoral artery stent and above-the-knee popliteal stent without evidence of restenosis. The ABI was more than 1.3 on the right and more than 1.3 on the left. This was consistent with noncompressible arteries due to medial calcification. The right great toe pressure and PPG waveforms are within normal limits and the left great toe pressure and PPG waveforms are decreased. the x-ray of the right ankle has not yet been done 08/24/2017 -- had a right ankle x-ray -- IMPRESSION:1. No fracture, bone lesion or evidence  of osteomyelitis. 2. Lateral soft tissue swelling with a soft tissue ulcer. she has not yet seen the vascular surgeon for review 08/31/17 on evaluation today patient's wound appears to be showing signs of improvement. She still with her appointment with vascular in order to review her results of her vascular study and then determine if any intervention would be recommended at that time. No fevers, chills, nausea, or vomiting noted at this time. She has been tolerating the dressing changes without complication. 09/28/17 on evaluation today patient's wound appears to show signs of good improvement in regard to the granulation tissue which is surfacing. There is still a layer of slough covering the wound and the posterior portion is still significantly deeper than the anterior nonetheless there has been some good sign of things moving towards the better. She is going to go back to Dr. dew for reevaluation to ensure her blood flow is still appropriate. That will be before her next evaluation with [place] next week. No fevers, chills, nausea, or vomiting noted at this time. Patient does have some discomfort rated to be a 3-4/10 depending on activity specifically cleansing the wound makes it worse. 10/05/2017 -- the patient was seen by Dr. Lucky Cowboy last week and noninvasive studies showed a normal right ABI with brisk triphasic waveforms consistent with no arterial insufficiency including normal digital pressures. The duplex showed a patent distal right SFA stent and the proximal SFA was also normal. He was pleased with her test and thought she should have enough of perfusion for normal wound healing. He would [name] her back in 6 months time. 12/21/17 on evaluation today patient appears to be doing fairly well in regard to her right lateral ankle wound. Unfortunately the main issue that she is expansion at this point is that she is having some issues with what appears to be some cellulitis in the right anterior shin.  She has also been noting a little bit of uncomfortable feeling especially last night and her ankle area. I'm  afraid that she made the developing a little bit of an infection. With that being said I think it is in the early stages. 12/28/17 on evaluation today patient's ankle appears to be doing excellent. She's making good progress at this point the cellulitis seems to have improved after last week's evaluation. Overall she is having no significant discomfort which is excellent news. She does have an appointment with Dr. dew on March 29, 2018 for reevaluation in regard to the stent he placed. She seems to have excellent blood flow in the right lower extremity. 01/19/12 on evaluation today patient's wound appears to be doing very well. In fact she does not appear to require debridement at this point, there's no evidence of infection, and overall from the standpoint of the wound she seems to be doing very well. With that being said I believe that it may be time to switch to different dressing away from the [facility] Dressing she tells me she does have a lot going on her friend actually passed away yesterday and she's also having a lot of issues with her husband this obviously is weighing heavy on her as far as your thoughts and concerns today. 01/25/18 on evaluation today patient appears to be doing fairly well in regard to her right lateral malleolus. She has been tolerating the dressing changes without complication. Overall I feel like this is definitely showing signs of improvement as far as how the overall appearance of the wound is there's also evidence of epithelium start to migrate over the granulation tissue. In general I think that she is progressing nicely as far as the wound is concerned. The only concern she really has is whether or not we can switch to every other week visits in order to avoid having as many [name], [name] ([id number]) appointments as her daughters have a difficult  time getting her to her appointments as well as the patient's husband to his he is not doing very well at this point. 02/22/18 on evaluation today patient's right lateral malleolus ulcer appears to be doing great. She has been tolerating the dressing changes without complication. Overall you making excellent progress at this time. Patient is having no significant discomfort. 03/15/18 on evaluation today patient appears to be doing much more poorly in regard to her right lateral ankle ulcer at this point. Unfortunately since have last seen her her husband has passed just a few days ago is obviously weighed heavily on her her daughter also had surgery well she is with her today as usual. There does not appear to be any evidence of infection she does seem to have significant contusion/deep tissue injury to the right lateral malleolus which was not noted previous when I saw her last. It's hard to tell of exactly when this injury occurred although during the time she was spending the night in the hospital this may have been most likely. 03/22/18 on evaluation today patient appears to actually be doing very well in regard to her ulcer. She did unfortunately have a setback which was noted last week however the good news is we seem to be getting back on track and in fact the wound in the core did still have some necrotic tissue which will be addressed at this point today but in general I'm seeing signs that things are on the up and up. She is glad to hear this obviously she's been somewhat concerned that due to the how her wound digressed more recently. 03/29/18 on evaluation today patient  appears to be doing fairly well in regard to her right lower extremity lateral malleolus ulcer. She unfortunately does have a new area of pressure injury over the inferior portion where the wound has opened up a little bit larger secondary to the pressure she seems to be getting. She does tell me sometimes when she sleeps at  night that it actually hurts and does seem to be pushing on the area little bit more unfortunately. There does not appear to be any evidence of infection which is good news. She has been tolerating the dressing changes without complication. She also did have some bruising in the left second and third toes due to the fact that she may have bump this or injured it although she has neuropathy so she does not feel she did move recently that may have been where this came from. Nonetheless there does not appear to be any evidence of infection at this time. 04/12/18 on evaluation today patient's wound on the right lateral ankle actually appears to be doing a little bit better with a lot of necrotic docking tissue centrally loosening up in clearing away. However she does have the beginnings of a deep tissue injury on the left lateral malleolus likely due to the fact we've been trying offload the right as much as we have. I think she may benefit from an assistive soft device to help with offloading and it looks like they're looking at one of the doughnut conditions that wraps around the lower leg to offload which I think will definitely do a good job. With that being said I think we definitely need to address this issue on the left before it becomes a wound. Patient is not having significant pain. 04/19/18 on evaluation today patient appears to be doing excellent in regard to the progress she's made with her right lateral ankle ulcer. The left ankle region which did show evidence of a deep tissue injury seems to be resolving there's little fluid noted underneath and a blister there's nothing open at this point in time overall I feel like this is progressing nicely which is good news. She does not seem to be having significant discomfort at this point which is also good news. 04/25/18-She is here in follow up evaluation for bilateral lateral malleolar ulcers. The right lateral malleolus ulcer with pale  subcutaneous tissue exposure, central area of ulcer with tendon/periosteum exposed. The left lateral malleolus ulcer now with central area of nonviable tissue, otherwise deep tissue injury. She is wearing compression wraps to the left lower extremity, she will place the right lower extremity compression wraps on when she gets home. She will be out of town over the weekend and return next week and follow-up appointment. She completed her doxycycline this morning 05/03/18 on evaluation today patient appears to be doing very well in regard to her right lateral ankle ulcer in general. At least she's showing some signs of improvement in this regard. Unfortunately she has some additional injury to the left lateral malleolus region which appears to be new likely even over the past several days. Again this determination is based on the overall appearance. With that being said the patient is obviously frustrated about this currently. 05/10/18-She is here in follow-up evaluation for bilateral lateral malleolar ulcers. She states she has purchased offloading shoes/boots and they will arrive tomorrow. She was asked to bring them in the office at next week's appointment so her provider is aware of product being utilized. She continues to sleep on  right or left side, she has been encouraged to sleep on her back. The right lateral malleolus ulcer is precariously close to peri-osteum; will order xray. The left lateral malleolus ulcer is improved. Will switch back to santyl; she will follow up next week. 05/17/18 on evaluation today patient actually appears to be doing very well in regard to her malleolus her ulcers compared to last time I saw them. She does not seem to have as much in the way of contusion at this point which is great news. With that being said she does continue to have discomfort and I do believe that she is still continuing to benefit from the offloading/pressure reducing boots that were recommended.  I think this is the key to trying to get this to heal up completely. 05/24/18 on evaluation today patient actually appears to be doing worse at this point in time unfortunately compared to her last week's evaluation. She is having really no increased pain which is good news unfortunately she does have more maceration in your theme and noted surrounding the right lateral ankle the left lateral ankle is not really is erythematous I do not [name] signs of the overt cellulitis on that side. Unfortunately the wounds do not seem to have shown any signs of improvement since the last evaluation. She also has significant swelling especially on the right compared to previous some of this may be due to infection however also think that she may be served better while she has these wounds by compression wrapping versus continuing to use the Juxta-Lite for the time being. Especially with the amount of drainage that she is experiencing at this point. No fevers, chills, nausea, or vomiting noted at this time. 05/31/18 on evaluation today patient appears to actually be doing better in regard to her right lateral lower extremity ulcer specifically on the malleolus region. She has been tolerating the antibiotic without complication. With that being said she still continues to have issues but a little bit of redness although nothing like she what she was experiencing previous. She still continues to pressure to her ankle area she did get the problem on offloading boots unfortunately she will not wear them she states there too uncomfortable and she can't get in and out of the bed. Nonetheless at this point her wounds seem to be continually getting worse which is not what we want I'm getting somewhat concerned about her progress and how things are going to proceed if we do not intervene in some way shape or form. I therefore had a very lengthy conversation today about offloading yet again and even made a specific suggestion for  switching her to a memory foam mattress and even gave the information for a specific one that they could look at getting if it was something that they were interested in considering. She does not want to be considered for a hospital bed air mattress although honestly insurance would not cover it that she does not have any wounds on her trunk. 06/14/18 on evaluation today both wounds over the bilateral lateral malleolus her ulcers appear to be doing better there's no evidence of pressure injury at this point. She did get the foam mattress for her bed and this does seem to have been extremely beneficial for her in my pinion. Her daughter states that she is having difficulty getting out of bed because of how soft it is. The patient also relates this to be. Nonetheless I do feel like she's actually doing better. Unfortunately right  after and around the time she was getting the mattress she also sustained a fall when she got up to go pick up the phone and ended up injuring her right elbow she has 18 sutures in place. We are not caring for this currently although home health is going to be taking the sutures out shortly. Nonetheless this may be something that we need to evaluate going forward. It depends on [name], [name] ([id number]) how well it has or has not healed in the end. She also recently saw an orthopedic specialist for an injection in the right shoulder just before her fall unfortunately the fall seems to have worsened her pain. 06/21/18 on evaluation today patient appears to be doing about the same in regard to her lateral malleolus ulcers. Both appear to be just a little bit deeper but again we are clinging away the necrotic and dead tissue which I think is why this is progressing towards a deeper realm as opposed to improving from my measurement standpoint in that regard. Nonetheless she has been tolerating the dressing changes she absolutely hates the memory foam mattress topper that was  obtained for her nonetheless I do believe this is still doing excellent as far as taking care of excess pressure in regard to the lateral malleolus regions. She in fact has no pressure injury that I [name] whereas in weeks past it was week by week I was constantly seeing new pressure injuries. Overall I think it has been very beneficial for her. 07/03/18; patient arrives in my clinic today. She has deep punched out areas over her bilateral lateral malleoli. The area on the right has some more depth. We spent a lot of time today talking about pressure relief for these areas. This started when her daughter asked for a prescription for a memory foam mattress. I have never written a prescription for a mattress and I don't think insurances would pay for that on an ordinary bed. In any case he came up that she has foam boots that she refuses to wear. I would suggest going to these before any other offloading issues when she is in bed. They say she is meticulous about offloading this the rest of the day 07/10/18- She is seen in follow-up evaluation for bilateral, lateral malleolus ulcers. There is no improvement in the ulcers. She has purchased and is sleeping on a memory foam mattress/overlay, she has been using the offloading boots nightly over the past week. She has a follow up appointment with vascular medicine at the end of October, in my opinion this follow up should be expedited given her deterioration and suboptimal TBI results. We will order plain film xray of the left ankle as deeper structures are palpable; would consider having MRI, regardless of xray report(s). The ulcers will be treated with iodoflex/iodosorb, she is unable to safely change the dressings daily with santyl. 07/19/18 on evaluation today patient appears to be doing in general visually well in regard to her bilateral lateral malleolus ulcers. She has been tolerating the dressing changes without complication which is good news. With that  being said we did have an x-ray performed on 07/12/18 which revealed a slight loosen [name] in the lateral portion of the distal left fibula which may represent artifact but underline lytic destruction or osteomyelitis could not be excluded. MRI was recommended. With that being said we can [name] about getting the patient scheduled for an MRI to further evaluate this area. In fact we have that scheduled currently for  August 20 19,019. 07/26/18 on evaluation today patient's wound on the right lateral ankle actually appears to be doing fairly well at this point in my pinion. She has made some good progress currently. With that being said unfortunately in regard to the left lateral ankle ulcer this seems to be a little bit more problematic at this time. In fact as I further evaluated the situation she actually had bone exposed which is the first time that's been the case in the bone appear to be necrotic. Currently I did review patient's note from Dr. Bunnie Domino office with [facility]. He stated that ABI was 1.26 on the right and 0.95 on the left with good waveforms. Her perfusion is stable not reduced from previous studies and her digital waveforms were pretty good particularly on the right. His conclusion upon review of the note was that there was not much she could do to improve her perfusion and he felt she was adequate for wound healing. His suggestion was that she continued to [name] [place] and consider a synthetic skin graft if there was no underlying infection. He plans to [name] her back in six months or as needed. 08/01/18 on evaluation today patient appears to be doing better in regard to her right lateral ankle ulcer. Her left lateral ankle ulcer is about the same she still has bone involvement in evidence of necrosis. There does not appear to be evidence of infection at this time On the right lateral lower extremity. I have started her on the Augmentin she picked this up and started this  yesterday. This is to get her through until she sees infectious disease which is scheduled for 08/12/18. 08/06/18 on evaluation today patient appears to be doing rather well considering my discussion with patient's daughter at the end of last week. The area which was marked where she had erythema seems to be improved and this is good news. With that being said overall the patient seems to be making good improvement when it [name] to the overall appearance of the right lateral ankle ulcer although this has been slow she at least is coming around in this regard. Unfortunately in regard to the left lateral ankle ulcer this is osteomyelitis based on the pathology report as well is bone culture. Nonetheless we are still waiting CT scan. Unfortunately the MRI we originally ordered cannot be performed as the patient is a pacemaker which I had overlooked. Nonetheless we are working on the CT scan approval and scheduling as of now. She did go to the hospital over the weekend and was placed on IV Cefzo for a couple of days. Fortunately this seems to have improved the erythema quite significantly which is good news. There does not appear to be any evidence of worsening infection at this time. She did have some bleeding after the last debridement therefore I did not perform any sharp debridement in regard to left lateral ankle at this point. Patient has been approved for a snap vac for the right lateral ankle. 08/14/18; the patient with wounds over her bilateral lateral malleoli. The area on the right actually looks quite good. Been using a snap back on this area. Healthy granulation and appears to be filling in. Unfortunately the area on the left is really problematic. 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 Also noted to have cellulitis. She saw Dr. Novella Olive of infectious disease today and was put  on linezolid. We are able to verify  this with her pharmacy. She is completed the Augmentin that she was already on. We've been using Iodoflex to this area 08/23/18 on evaluation today patient's wounds both actually appear to be doing better compared to my prior evaluations. Fortunately she showing signs of good improvement in regard to the overall wound status especially where were using the snap vac on the right. In regard to left lateral malleolus the wound bed actually appears to be much cleaner than previously noted. I do not feel any phone directly probed during evaluation today and though there is tendon noted this does not appear to be necrotic it's actually fairly good as far as the overall appearance of the tendon is concerned. In general the wound bed actually appears to be doing significantly better than it was previous. Patient is currently in the care of [name] and I did review that note today. He actually has her on two weeks of linezolid and then following the patient will be on 1-2 months of Keflex. That is the plan currently. She has been on antibiotics therapy as prescribed by myself initially starting on July 30, 2018 and has been on that continuously up to this point. 08/30/18 on evaluation today patient actually appears to be doing much better in regard to her right lateral malleolus ulcer. She has been tolerating the dressing changes specifically the snap vac without complication although she did have some issues with the seal currently. Apparently there was some trouble with getting it to maintain over the past week past Sunday. Nonetheless overall the wound appears better in regard to the right lateral malleolus region. In regard to left lateral malleolus this actually show some signs of additional granulation although there still tendon noted in the base of the wound this appears to be healthy not necrotic in any way whatsoever. We are considering potentially using a snap vac for the left lateral malleolus as  well the product wrap from [employer], [place], was present in the clinic today we're going to [name] this patient I did have her come in with me after obtaining consent from the patient and her daughter in order to look at the wound and [name] if there's any recommendation one way or another as to whether or not they felt the snapback could be beneficial for the left lateral malleolus region. But EMMAH, BRATCHER ([id number]) the conclusion was that it might be but that this is definitely a little bit deeper wound than what traditionally would be utilized for a snap vac. 09/06/18 on evaluation today patient actually appears to be doing excellent in my pinion in regard to both ankle ulcers. She has been tolerating the dressing changes without complication which is great news. Specifically we have been using the snap vac. In regard to the right ankle I'm not even sure that this is going to be necessary for today and following as the wound has filled in quite nicely. In regard to the left ankle I do believe that we're seeing excellent epithelialization from the edge as well as granulation in the central portion the tendon is still exposed but there's no evidence of necrotic bone and in general I feel like the patient has made excellent progress even compared to last week with just one week of the snap vac. 09/11/18; this is a patient who has wounds on her bilateral lateral malleoli. Initially both of these were deep stage IV wounds in the setting of chronic arterial insufficiency.  She has been revascularized. As I understand think she been using snap vacs to both of these wounds however the area on the right became more superficial and currently she is only using it on the left. Using silver collagen on the right and silver collagen under the back on the left I believe 09/19/18 on evaluation today patient actually appears to be doing very well in regard to her lateral malleolus or ulcers bilaterally. She has  been tolerating the dressing changes without complication. Fortunately there does not appear to be any evidence of infection at this time. Overall I feel like she is improving in an excellent manner and I'm very pleased with the fact that everything seems to be turning towards the better for her. This has obviously been a long road. 09/27/18 on evaluation today patient actually appears to be doing very well in regard to her bilateral lateral malleolus ulcers. She has been tolerating the dressing changes without complication. Fortunately there does not appear to be any evidence of infection at this time which is also great news. No fevers, chills, nausea, or vomiting noted at this time. Overall I feel like she is doing excellent with the snap vac on the left malleolus. She had 40 mL of fluid collection over the past week. 10/04/18 on evaluation today patient actually appears to be doing well in regard to her bilateral lateral malleolus ulcers. She continues to tolerate the dressing changes without complication. One issue that I [name] is the snap vac on the left lateral malleolus which appears to have sealed off some fluid underlying this area and has not really allowed it to heal to the degree that I would like to [name]. For that reason I did suggest at this point we may want to pack a small piece of packing strip into this region to allow it to more effectively wick out fluid. 10/11/18 in general the patient today does not feel that she has been doing very well. She's been a little bit lethargic and subsequently is having bodyaches as well according to what she tells me today. With that being said overall she has been concerned with the fact that something may be worsening although to be honest her wounds really have not been appearing poorly. She does have a new ulcer on her left heel unfortunately. This may be pressure related. Nonetheless it seems to me to have potentially started at least as a blister I  do not [name] any evidence of deep tissue injury. In regard to the left ankle the snap vac still seems to be causing the ceiling off of the deeper part of the wound which is in turn trapping fluid. I'm not extremely pleased with the overall appearance as far as progress from last week to this week therefore I'm gonna discontinue the snap vac at this point. 10/18/18 patient unfortunately this point has not been feeling well for the past several days. She was seen by Grayland Ormond her primary care provider who is a Librarian, academic at [facility]. Subsequently she states that she's been very weak and generally feeling malaise. No fevers, chills, nausea, or vomiting noted at this time. With that being said bloodwork was performed at the PCP office on the 11th of this month which showed a white blood cell count of 10.7. This was repeated today and shows a white blood cell count of 12.4. This does show signs of worsening. Coupled with the fact that she is feeling worse and that her left ankle  wound is not really showing signs of improvement I feel like this is an indication that the osteomyelitis is likely exacerbating not improving. Overall I think we may also want to check her C-reactive protein and sedimentation rate. Actually did call [facility] office this afternoon while the patient was in the office here with me. Subsequently based on the findings we discussed treatment possibilities and I think that it is appropriate for [place] to go ahead and initiate treatment with doxycycline which I'm going to do. Subsequently he did agree to [name] about adding a CRP and sedimentation rate to her orders. If that has not already been drawn to where they can run it they will contact the patient she can come back to have that check. They are in agreement with plan as far as the patient and her daughter are concerned. Nonetheless also think we need to get in touch with Dr. Henreitta Leber office to [name] about getting  the patient scheduled with him as soon as possible. 11/08/18 on evaluation today patient presents for follow-up concerning her bilateral foot and ankle ulcers. I did do an extensive review of her chart in epic today. Subsequently she was seen by [name] he did initiate Cefepime IV antibiotic therapy. Subsequently she had some issues with her PICC line this had to be removed because it was coiled and then replaced. Fortunately that was now settled. Unfortunately she has continued have issues with her left heel as well as the issues that she is experiencing with her bilateral lateral malleolus regions. I do believe however both areas seem to be doing a little bit better on evaluation today which is good news. No fevers, chills, nausea, or vomiting noted at this time. She actually has an angiogram schedule with Dr. dew on this coming Monday, November 11, 2018. Subsequently the patient states that she is feeling much better especially than what she was roughly 2 weeks ago. She actually had to cancel an appointment because she was feeling so poorly. No fevers, chills, nausea, or vomiting noted at this time. 11/15/18 on evaluation today patient actually is status post having had her angiogram with Dr. dew Monday, four days ago. 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 He had to go and work on several areas of the vasculature fortunately he was able to obtain no more than a 30% residual stenosis throughout post procedure. I reviewed this note today. I think this will definitely help with healing at this time. Fortunately there does not appear to be any signs of infection and I do feel like ratio already has a better appearance to it. 11/22/18 upon evaluation today patient actually appears to be doing very well in regard to her wounds in general. The right lateral malleolus looks excellent the heel looks better in the left lateral malleolus also appears to be doing a little better.  With that being said the right second toe actually appears to be open and training we been watching this is been dry and stable but now is open. 12/03/2018 Seen today for follow-up and management of multiple bilateral lower extremity wounds. New pressure injury of the great toe which is closed at this time. Wound of the right distal second toe appears larger today with deep undermining and a pocket of fluid present within the undermining region. Left and right malleolus is wounds are stable today with no signs and symptoms of infection.Denies any needs or concerns during exam today. 12/13/18 on  evaluation today patient appears to be doing somewhat better in regard to her left heel ulcer. She also seems to be completely healed in regard to the right lateral malleolus ulcer. The left malleolus ulcer is smaller what unfortunately the wounds which are new over the first and second toes of the right foot are what are most concerning at this point especially the second. Both areas did require sharp debridement today. 12/20/18 on evaluation today patient's wound actually appears to be doing better in regard to left lateral ankle and her right lateral ankle continues to remain healed. The hill ulcer on the left is improved. She does have improvement noted as well in regard to both toe ulcers. Overall I'm very pleased in this regard. No fevers, chills, nausea, or vomiting noted at this time. [name], [name] ([id number]) 12/23/18 on evaluation today patient is seen after she had her toenails trimmed at the podiatrist office due to issues with her right great toe. There was what appeared to be dark eschar on the surface of the wound which had her in the podiatrist concerned. Nonetheless as I remember that during the last office visit I had utilize silver nitrate of this area I was much less concerned about the situation. Subsequently I was able to clean off much of this tissue without any complication today.  This does not appear to show any signs of infection and actually look somewhat better compared to last time post debridement. Her second toe on the right foot actually had callous over and there did appear still be some fluid underneath this that would require debridement today. 12/27/18 on evaluation today patient actually appears to be showing signs of improvement at all locations. Even the left lateral ankle although this is not quite as great as the other sites. Fortunately there does not appear to be any signs of infection at this time and both of her toes on the right foot seem to be showing signs of improvement which is good news and very pleased in this regard. 01/03/19 on evaluation today patient appears to be doing better for the most part in regard to her wounds in particular. There does not appear to be any evidence of infection at this time which is good news. Fortunately there is no sign of really worsening anywhere except for the right great toe which she does have what appears to be a bruise/deep tissue injury which is very superficial and already resolving. I'm not sure where this came from I questioned her extensively and she does not recall what may have happened with this. Other than that the patient seems to be doing well even the left lateral ankle ulcer looks good and is getting smaller. 01/10/19 on evaluation today patient appears to be doing well in regard to her left heel wound and both of her toe wounds. Overall I feel like there is definitely improvement here and I'm happy in that regard. With that being said unfortunately she is having issues with the left lateral malleolus ulcer which unfortunately still has a lot of depth to it. This is gonna be a very difficult wound for [place] to be able to truly get to heal. I may want to consider some type of skin substitute to [name] if this would be of benefit for her. I'll discuss this with her more the next visit most likely. This  was something I thought about more at the end of the visit when I was Artie out of the room and the  patient had been discharged. 01/17/19 on evaluation today patient appears to be doing very well in regard to her wounds in general. She's been making excellent progress at this time. Fortunately there's no sign of infection at this time either. No fevers, chills, nausea, or vomiting noted at this time. The biggest issue is still her left lateral malleolus where it appears to be doing well and is getting smaller but still shows a small corner where this is deeper and goes down into what appears to be the joint space. Nonetheless this is taking much longer to heal although it still looks better in smaller than previous evaluations. 01/24/19 on evaluation today patient's wounds actually appear to be doing rather well in general overall. She did require some sharp debridement in regard to the right great toe but everything else appears to be doing excellent no debridement was even necessary. No fevers, chills, nausea, or vomiting noted at this time. 01/31/19 on evaluation today patient actually appears to be doing much better in regard to her left foot wound on the heel as well as the ankle. The right great toe appears to be a little bit worse today this had callous over and trapped a lot of fluid underneath. Fortunately there's no signs of infection at any site which is great news. 02/07/19 on evaluation today patient actually appears to be doing decently well in regard to all of her ulcers at this point. No sharp debridement was required she is a little bit of hyper granulation in regard to the left lateral ankle as well as the left heel but the hill itself is almost completely healed which is excellent news. Overall been very pleased in this regard. 02/14/19 on evaluation today patient actually appears to be doing very well in regard to her ulcers on the right first toe, left lateral malleolus, and left  heel. In fact the heel is almost completely healed at this point. The patient does not show any signs of infection which is good news. Overall very pleased with how things have progressed. 04/18/19 Telehealth Evaluation During the COVID-19 National Emergency: Verbal Consent: Obtained from patient Allergies: reviewed and the active list is current. Medication changes: patient has no current medication changes. COVID-19 Screening: 1. Have you traveled internationally or on a cruise ship in the last 14 dayso No 2. Have you had contact with someone with or under investigation for COVID-19o No 3. Have you had a fever, cough, sore throat, or experiencing shortness of breatho No on evaluation today actually did have a visit with this patient through a telehealth encounter with her home health nurse. Subsequently it was noted that the patient actually appears to be doing okay in regard to her wounds both the right great toe as well as the left lateral malleolus have shown signs of improvement although this in your theme around the left lateral malleolus there eschar coverings for both locations. The question is whether or not they are actually close and whether or not home health needs to discharge the patient or not. Nonetheless my concern is this point obviously is that without actually seeing her and being able to evaluate this directly I cannot ensure that she is completely healed which is the question that I'm being asked. 04/22/19 on evaluation today patient presents for her first evaluation since last time I saw her which was actually February 14, 2019. I did do a telehealth visit last week in which point it was questionable whether or not she may be  healed and had to bring her in today for confirmation. With that being said she does seem to be doing quite well at this point which is good news. There does not appear to be any drainage in the deed I believe her wounds may be  healed. Readmission: 09/04/2019 on evaluation today patient appears to be doing unfortunately somewhat more poorly in regard to her left foot ulcer secondary to a wound that began on 08/21/2019 at least when she first noticed this. Fortunately she has not had any evidence of active infection at this time. Systemically. I also do not necessarily [name] any evidence of infection at the blister/wound site on the first metatarsal head plantar aspect. This almost appears to be something that may have just rubbed inappropriately causing this to breakdown. They did not want a wait too long to come in to be seen as again she had significant issues in the past with wounds that took quite a while to heal in fact it was close to 2 years. Nonetheless this does not appear to be quite that bad but again we do need to remove some of the necrotic tissue from the surface of the wound to tell exactly the extent. She does not appear to have any significant arterial disease at this point and again her last ABIs and TBI's are recorded above in the alert section her left ABI was 1.27 with a TBI of 0.72 to the right ABI 1.08 with a TBI of 0.39. Other than this the patient has been doing quite [name], [name] ([id number]) well since I last saw her and that was in May 2020. 09/11/2019 on evaluation today patient appeared to be doing very well with regard to her plantar foot ulcer on the left. In fact this appears to be almost completely healed which is awesome. That is after just 1 week of intervention. With that being said there is no signs of active infection at this time. 09/18/2019 on evaluation today patient actually appears to be doing excellent in fact she is completely healed based on what I am seeing at this point. Fortunately there is no signs of active infection at this time and overall patient is very pleased to hear that this area has healed so quickly. Readmission: 05/13/2021 upon evaluation today this patient  presents for reevaluation here in the clinic. This is a wound that actually we previously took care of. She had 1 on the right ankle and the left the left turned out to be be harder due to to heal but nonetheless is doing great at this point as the right that has reopened and it was noted first just several weeks ago with a scab over it and came off in just the past few days. Fortunately there does not appear to be any obvious evidence of significant active infection at this time which is great news. No fevers, chills, nausea, vomiting, or diarrhea. 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 There does not appear to be any signs of this interfering in any way with her wound. She does have swelling we previously had compression socks for her ordered but again it does not look like she wears these on a regular basis by any means. 05/26/2021 upon evaluation today patient appears to be doing well with regard to her wound which is actually showing signs of excellent improvement. There does not appear to be any signs of active infection which is great news  and overall very pleased with where things stand today. No fevers, chills, nausea, vomiting, or diarrhea. 06/02/2021 upon evaluation today patient's wound actually showing signs of excellent improvement. Fortunately there does not appear to be any signs of active infection which is great news. I think the patient is making good progress with regard to her wounds in general. 06/09/2021 upon evaluation today patient appears to be doing excellent in regard to her wounds currently. Fortunately there does not appear to be any signs of active infection which is great news. No fevers, chills, nausea, vomiting, or diarrhea. Overall extremely pleased with where things stand today. I think the patient is making excellent progress. 06/16/2021 upon evaluation today patient appears to be doing well in regard to her wound.  This is going require little bit of debridement today and that was discussed with the patient. Otherwise she seems to be doing quite well and I am actually very pleased with where things stand at this point. No fevers, chills, nausea, vomiting, or diarrhea. 06/23/2021 upon evaluation today patient appears to be doing well with regard to her wounds. She has been tolerating the dressing changes without complication. Fortunately there does not appear to be any evidence of infection and she has not had air in her home which she actually lives at an assisted living that got fixed this morning. With that being said because of that her wrap has been extremely hot and bothersome for her over the past week. 06/30/2021 upon evaluation today patient is actually making excellent progress in regard to her ankle ulcer. She has been tolerating the dressing changes without complication and overall extremely pleased with where things stand there does not appear to be any evidence of active infection which is great news. No fevers, chills, nausea, vomiting, or diarrhea. 07/07/21 upon evaluation today patients and culture on the right actually appears to be doing quite well. There does not appear to be any signs of infection and overall very pleased with where things stand today. No fevers, chills, nausea, or vomiting noted at this time. 07/14/2021 unfortunately the patient today has some evidence of deep tissue injury and pressure getting to the ankle region. Again I am not exactly sure what is going on here but this is very similar to issues that we have had in the past. I explained to the patient that she needs to be very mindful of exactly what is happening I think sleeping in bed is probably the main issue here although there could be other culprits I am not sure what else would potentially lead to this kind of a problem for her. 07/21/2021 upon evaluation today patient's wound actually showing signs of improvement  compared to last week. Fortunately there does not appear to be any signs of active infection which is great news and overall very pleased with where things stand in that regard. With that being said I do believe that she is continuing to show signs of overall of getting better although I think this is still basically about what we were 2 weeks ago due to the worsening and now improvement. 07/28/2021 upon evaluation today patient appears to be doing well with regard to her wound. She does have some slough buildup on the surface of the wound which I would have to manage today. Fortunately there is no sign of active infection at this time. No fevers, chills, nausea, vomiting, or diarrhea. 08/04/2021 upon evaluation today patient appears to be doing about the same in regard to her wound.  To be perfectly honest I am beginning to be a little bit concerned about the overall appearance of the wound bed. I do think possibly taking a sample right around the margin of the wound could be beneficial for her as far as identifying anything such as an inflammatory process or to be honest even a skin tag cancer type process that may be of concern here. Fortunately there does not appear to be any evidence of active infection at this time which is great news she is not having any pain also great news. 08/11/2021 upon evaluation today patient appears to be doing well with regard to her wound. The good news is I did review her biopsy results and it showed some inflammatory mixed findings but nothing that appeared to be malignant which is great news. Overall this is more of a chronic venous stasis type issue which again is more what we have been treating. Nonetheless I just wanted to make sure before going forward that there was not anything more untoward going on at this point. 08/18/2021 upon evaluation today patient appears to be doing well with regard to her ankle ulcer. Fortunately there does not appear to be any signs of  active infection at this time which is great overall wound is dramatically improved compared to last week. Since last week I have actually placed her on doxycycline and subsequently this is a good option as far as the findings are concerned at this point. 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 the doxycycline is doing that. There does not appear to be any evidence of active infection systemically which is great news. 08/25/2021 upon evaluation today patient appears to be doing well with regard to her wound. I feel like we are finally get back on track as far as healing is concerned I am much happier with the overall appearance today. I do think that she is tolerating the dressing changes without complication which is great news. We have been using Hydrofera Blue which I think is a good option. The good news is she is also doing great in [place], [name]. ([id number]) regard to her compression sock on the left which is a zipper compression that seems to be doing a great job keeping her edema under good control. 09/01/2021 upon evaluation today patient appears to be doing well with regard to her wound. Infection seems to be under much better control which is great news and very pleased in that regard. Fortunately there does not appear to be any signs of infection currently. 09/08/2021 upon evaluation today patient actually appears to be making good progress in regard to her wound. She has been tolerating the dressing changes without complication. Fortunately there does not appear to be any evidence of active infection at this time which is great news as well. No fevers, chills, nausea, vomiting, or diarrhea. 09/22/2021 upon evaluation today patient appears to be doing well with regard to her wound although is very slow to heal. We have not looked into Apligraf yet I think  that is something that we should [name] about doing. 09/29/2021 upon evaluation today patient's wound is actually showing signs of doing about the same. I am not seeing any evidence of worsening but also no significant evidence of improvement. We did gain approval for the organogenesis products all except for Apligraf as covered by her insurance.  With that being said I do think that we can go ahead and proceed with the NuShield if the patient and her family in agreement of the plan I discussed that with him today she does have a 20% coinsurance which we also discussed. 10/06/2021 upon evaluation today patient appears to unfortunately be doing a little bit worse she appears to be infected based on what I am seeing. Fortunately there does not appear to be any signs of active infection at this time which is great news. Unfortunately it does appear to be some evidence of around the wound edge indicated by way of erythema and warmth as well as redness 10/13/2021 upon evaluation today patient actually appears to be doing excellent in regard to her ankle ulcer compared to what it was. 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 I think were fine to go ahead and apply the NuShield today. 10/20/2021 upon evaluation today patient appears to be doing excellent in fact the NuShield seems to have done all some for her thus far. I am actually very pleased with where we stand and overall I think that she is making great progress. There is no evidence of active infection at this time. 11/23; patient presents for follow-up. She has no issues or complaints today. She denies signs of infection. She reports stability in her wound healing. 11/03/2021 upon evaluation today patient appears to be doing well with regard to her wounds.  She has been tolerating the dressing changes without complication. Fortunately there is no signs of active infection at this time. 11/10/2021 upon evaluation today patient appears to be doing well with regard to her right ankle which is actually showing signs of improvement with the NuShield I am very pleased. Subsequently in regards to the left ankle this appears to be doing okay with no evidence of issue here either. With that being said she has a significant contusion on the left leg from having fallen 2 days ago. She tells me that she was using her walker going to the closet and then when she got to the closet turned around to get something out at which point she fell according to the story. Nonetheless she does have a hematoma just below her knee on the anterior portion of her shin. My hope is that this will not open until wound although we do need I think Some compression over it also think that we need to have her use an ice as well to help with the swelling and prevent this from getting worse. The last thing she needs is a wound opened up here. 11/17/2021 upon evaluation patient's right ankle actually showing signs of improvement based on what I [name] currently there is a lot of new skin growth coming in which is great news. Nonetheless I do feel like that the patient is showing improvement as well in regard to her left leg with the use of the Tubigrip it swollen but not as bruised as what I would have expected after what I was seeing last week. Nonetheless I do think doubling up on the Tubigrip would probably be beneficial to try to keep some of the edema under better control here. 11/24/2021 upon evaluation today patient appears to be doing excellent in regard to her wound. This is actually measuring significantly smaller which  is great news. Fortunately I do not [name] any signs of active infection locally nor systemically at this point. Overall I am very pleased with how the NuShield is  doing. 12/30; wound bed looks healthy however not much change in overall wound volume. We applied Nushield again today in the standard fashion 12/08/2021 upon evaluation today patient's wound actually showing signs of good improvement and I am actually very pleased with where we stand today as well. I do not [name] any evidence of active infection locally nor systemically which is great news. Unfortunately she has been having some issues with stomach upset and diarrhea today. Objective Constitutional Well-nourished and well-hydrated in no acute distress. Vitals Time Taken: 11:11 AM, Height: 62 in, Weight: 150 lbs, BMI: 27.4, Temperature: 98.6 F, Pulse: 71 bpm, Respiratory Rate: 18 breaths/min, Blood Pressure: 156/82 mmHg. Respiratory normal breathing without difficulty. [name], SZYMBORSKI ([id number]) Psychiatric this patient is able to make decisions and demonstrates good insight into disease process. Alert and Oriented x 3. pleasant and cooperative. General Notes: Upon inspection patient's wound bed actually showed signs of good granulation and epithelization at this point. Fortunately there does not appear to be any evidence of active infection locally nor systemically at this point and overall I think that the wound is measuring better and looking quite nice as far as the healing is concerned I am very pleased. Integumentary (Hair, Skin) Wound #7 status is Open. Original cause of wound was Gradually Appeared. The date acquired was: 05/12/2021. The wound has been in treatment 29 weeks. The wound is located on the Right,Lateral Malleolus. The wound measures 0.5cm length x 0.6cm width x 0.2cm depth; 0.236cm^2 area and 0.047cm^3 volume. There is Fat Layer (Subcutaneous Tissue) exposed. There is no tunneling or undermining noted. There is a medium amount of serosanguineous drainage noted. There is large (67-100%) red granulation within the wound bed. There is no necrotic tissue within the wound  bed. Assessment Active Problems ICD-10 Pressure ulcer of right ankle, stage 3 Lymphedema, not elsewhere classified Venous insufficiency (chronic) (peripheral) Presence of cardiac pacemaker Long term (current) use of anticoagulants Procedures Wound #7 Pre-procedure diagnosis of Wound #7 is a Pressure Ulcer located on the Right,Lateral Malleolus . There was a Three Layer Compression Therapy Procedure by Carlene Coria, RN. Post procedure Diagnosis Wound #7: Same as Pre-Procedure Plan Follow-up Appointments: Return Appointment in 1 week. Bathing/ Shower/ Hygiene: May shower with wound dressing protected with water repellent cover or cast protector. Edema Control - Lymphedema / Segmental Compressive Device / Other: Optional: One layer of unna paste to top of compression wrap (to act as an anchor). - and at toes Patient to wear own compression stockings. Remove compression stockings every night before going to bed and put on every morning when getting up. - left leg Elevate, Exercise Daily and Avoid Standing for Long Periods of Time. Elevate legs to the level of the heart and pump ankles as often as possible Elevate leg(s) parallel to the floor when sitting. WOUND #7: - Malleolus Wound Laterality: Right, Lateral Cleanser: Soap and Water 1 x Per Week/30 Days Discharge Instructions: Gently cleanse wound with antibacterial soap, rinse and pat dry prior to dressing wounds Primary Dressing: Prisma 4.34 (in) 1 x Per Week/30 Days Discharge Instructions: Moisten w/normal saline or sterile water; Cover wound as directed. Do not remove from wound bed. Secondary Dressing: Gauze 1 x Per Week/30 Days Discharge Instructions: As directed: dry, moistened with saline or moistened with Dakins Solution Compression Wrap: Profore Lite LF 3  Multilayer Compression Bandaging System 1 x Per Week/30 Days Discharge Instructions: Apply 3 multi-layer wrap as prescribed. 1. Working to switch over to a silver collagen  dressing which I think is good to be a much better way to go at this point patient is in agreement with plan. LEAH, THORNBERRY ([id number]) 2. Also can recommend that we have the patient continue with the compression wrapping. Which I think is keeping the edema under good control and does seem to be helping as well on the right. 3. With regard to the left leg she can now go back to using her standard compression sock which I think is doing a good job as well fortunately. I do not [name] any signs of active infection locally nor systemically at this point. The bruising that she previously had from the hematoma has pretty much completely resolved. We will [name] patient back for reevaluation in 1 week here in the clinic. If anything worsens or changes patient will contact our office for additional recommendations. Electronic Signature(s) Signed: 12/09/2021 10:51:56 AM By: Worthy Keeler PA-C Entered By: Worthy Keeler on 12/09/2021 10:51:55 Christopoulos, [name] ([id number]) -------------------------------------------------------------------------------- SuperBill Details Patient Name: [name] Date of Service: 12/08/2021 Medical Record Number: [id number] Patient Account Number: [id number] Date of Birth/Sex: 1923/07/13 (88 y.o. F) Treating RN: Carlene Coria Primary Care Provider: Adrian Prows Other Clinician: Referring Provider: Adrian Prows Treating Provider/Extender: Skipper Cliche in Treatment: 29 Diagnosis Coding ICD-10 Codes Code Description [phone] Pressure ulcer of right ankle, stage 3 I89.0 Lymphedema, not elsewhere classified I87.2 Venous insufficiency (chronic) (peripheral) Z95.0 Presence of cardiac pacemaker Z79.01 Long term (current) use of anticoagulants Facility Procedures CPT4 Code: [id number] Description: (Facility Use Only) [phone] - [place] [facility] RT LEG Modifier: Quantity: 1 Physician Procedures CPT4 Code: 1505697 Description: 94801 - WC  PHYS LEVEL 3 - EST PT Modifier: Quantity: 1 CPT4 Code: Description: ICD-10 Diagnosis Description 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, stage 3 I89.0 Lymphedema, not elsewhere classified I87.2 Venous insufficiency (chronic) (peripheral) Z95.0 Presence of cardiac pacemaker Modifier: Quantity: Electronic Signature(s) Signed: 12/09/2021 10:52:32 AM By: Worthy Keeler PA-C Entered By: Worthy Keeler on 12/09/2021 10:52:32

## 2021-12-09 NOTE — Progress Notes (Signed)
[name] ([id number]) Visit Report for 12/08/2021 Arrival Information Details Patient Name: [name] Date of Service: 12/08/2021 11:00 AM Medical Record Number: [id number] Patient Account Number: [id number] Date of Birth/Sex: 1923/04/14 (88 y.o. F) Treating RN: Carlene Coria Primary Care Lashone Stauber: Adrian Prows Other Clinician: Referring Abir Craine: Adrian Prows Treating Alexine Pilant/Extender: Skipper Cliche in Treatment: 29 Visit Information History Since Last Visit All ordered tests and consults were completed: No Patient Arrived: Ambulatory Added or deleted any medications: No Arrival Time: 11:07 Any new allergies or adverse reactions: No Accompanied By: daughter Had a fall or experienced change in No Transfer Assistance: None activities of daily living that may affect Patient Identification Verified: Yes risk of falls: Secondary Verification Process Completed: Yes Signs or symptoms of abuse/neglect since last visito No Patient Requires Transmission-Based No Hospitalized since last visit: No Precautions: Implantable device outside of the clinic excluding No Patient Has Alerts: Yes cellular tissue based products placed in the center Patient Alerts: Patient on Blood since last visit: Thinner Has Dressing in Place as Prescribed: Yes NOT DIABETIC Has Compression in Place as Prescribed: Yes ***ELIQUIS*** Pain Present Now: No Electronic Signature(s) Signed: 12/09/2021 4:33:01 PM By: Carlene Coria RN Entered By: Carlene Coria on 12/08/2021 11:12:03 Newbury, [name] ([id number]) -------------------------------------------------------------------------------- Clinic Level of Care Assessment Details Patient Name: [name] Date of Service: 12/08/2021 11:00 AM Medical Record Number: [id number] Patient Account Number: [id number] Date of Birth/Sex: [birth year] (88 y.o. F) Treating RN: Carlene Coria Primary Care [name] [name]: Adrian Prows Other  Clinician: Referring Janyia Guion: Adrian Prows Treating Charnele Semple/Extender: Skipper Cliche in Treatment: 29 Clinic Level of Care Assessment Items TOOL 1 Quantity Score []  - Use when EandM and Procedure is performed on INITIAL visit 0 ASSESSMENTS - Nursing Assessment / Reassessment []  - General Physical Exam (combine w/ comprehensive assessment (listed just below) when performed on new 0 pt. evals) []  - 0 Comprehensive Assessment (HX, ROS, Risk Assessments, Wounds Hx, etc.) ASSESSMENTS - Wound and Skin Assessment / Reassessment []  - Dermatologic / Skin Assessment (not related to wound area) 0 ASSESSMENTS - Ostomy and/or Continence Assessment and Care []  - Incontinence Assessment and Management 0 []  - 0 Ostomy Care Assessment and Management (repouching, etc.) PROCESS - Coordination of Care []  - Simple Patient / Family Education for ongoing care 0 []  - 0 Complex (extensive) Patient / Family Education for ongoing care []  - 0 Staff obtains Programmer, systems, Records, Test Results / Process Orders []  - 0 Staff telephones HHA, Nursing Homes / Clarify orders / etc []  - 0 Routine Transfer to another Facility (non-emergent condition) []  - 0 Routine Hospital Admission (non-emergent condition) []  - 0 New Admissions / Biomedical engineer / Ordering NPWT, Apligraf, etc. []  - 0 Emergency Hospital Admission (emergent condition) PROCESS - Special Needs []  - Pediatric / Minor Patient Management 0 []  - 0 Isolation Patient Management []  - 0 Hearing / Language / Visual special needs []  - 0 Assessment of Community assistance (transportation, D/C planning, etc.) []  - 0 Additional assistance / Altered mentation []  - 0 Support Surface(s) Assessment (bed, cushion, seat, etc.) INTERVENTIONS - Miscellaneous []  - External ear exam 0 []  - 0 Patient Transfer (multiple staff / Civil Service fast streamer / Similar devices) []  - 0 Simple Staple / Suture removal (25 or less) []  - 0 Complex Staple / Suture removal  (26 or more) []  - 0 Hypo/Hyperglycemic Management (do not check if billed separately) []  - 0 Ankle / Brachial Index (ABI) - do not check if billed separately Has the  patient been seen at the hospital within the last three years: Yes Total Score: 0 Level Of Care: ____ [name] ([id number]) Electronic Signature(s) Signed: 12/09/2021 4:33:01 PM By: Carlene Coria RN Entered By: Carlene Coria on 12/08/2021 11:32:23 Dunleavy, [name] ([id number]) -------------------------------------------------------------------------------- Compression Therapy Details Patient Name: [name] Date of Service: 12/08/2021 11:00 AM Medical Record Number: [id number] Patient Account Number: [id number] Date of Birth/Sex: Mar 24, 1923 (88 y.o. F) Treating RN: Carlene Coria Primary Care Shaiann Mcmanamon: Adrian Prows Other Clinician: Referring Alondria Mousseau: Adrian Prows Treating Eriona Kinchen/Extender: Skipper Cliche in Treatment: 29 Compression Therapy Performed for Wound Assessment: Wound #7 Right,Lateral Malleolus Performed By: Jake Church, RN Compression Type: Three Layer Post Procedure Diagnosis Same as Pre-procedure Electronic Signature(s) Signed: 12/09/2021 4:33:01 PM By: Carlene Coria RN Entered By: Carlene Coria on 12/08/2021 11:31:31 Hanahan, [name] ([id number]) -------------------------------------------------------------------------------- Encounter Discharge Information Details Patient Name: [name] Date of Service: 12/08/2021 11:00 AM Medical Record Number: [id number] Patient Account Number: [id number] Date of Birth/Sex: 1923/03/19 (88 y.o. F) Treating RN: Carlene Coria Primary Care Marium Ragan: Adrian Prows Other Clinician: Referring Derrion Tritz: Adrian Prows Treating Danthony Kendrix/Extender: Skipper Cliche in Treatment: 29 Encounter Discharge Information Items Discharge Condition: Stable Ambulatory Status: Walker Discharge Destination: Home Transportation:  Private Auto Accompanied By: daughter Schedule Follow-up Appointment: Yes Clinical Summary of Care: Patient Declined Electronic Signature(s) Signed: 12/09/2021 4:33:01 PM By: Carlene Coria RN Entered By: Carlene Coria on 12/08/2021 11:33:50 Vanloan, [name] ([id number]) -------------------------------------------------------------------------------- Lower Extremity Assessment Details Patient Name: [name] Date of Service: 12/08/2021 11:00 AM Medical Record Number: [id number] Patient Account Number: [id number] Date of Birth/Sex: Dec 19, 1922 (88 y.o. F) Treating RN: Carlene Coria Primary Care Kohlton Gilpatrick: Adrian Prows Other Clinician: Referring Shantea Poulton: Adrian Prows Treating Janyth Riera/Extender: Skipper Cliche in Treatment: 29 Edema Assessment Assessed: [Left: No] [Right: No] Edema: [Left: Ye] [Right: s] Calf Left: Right: Point of Measurement: 33 cm From Medial Instep 28 cm Ankle Left: Right: Point of Measurement: 10 cm From Medial Instep 18 cm Vascular Assessment Pulses: Dorsalis Pedis Palpable: [Right:Yes] Electronic Signature(s) Signed: 12/09/2021 4:33:01 PM By: Carlene Coria RN Entered By: Carlene Coria on 12/08/2021 11:24:16 Torti, [name] ([id number]) -------------------------------------------------------------------------------- Multi Wound Chart Details Patient Name: [name] Date of Service: 12/08/2021 11:00 AM Medical Record Number: [id number] Patient Account Number: [id number] Date of Birth/Sex: March 19, 1923 (88 y.o. F) Treating RN: Carlene Coria Primary Care Quency Tober: Adrian Prows Other Clinician: Referring Edell Mesenbrink: Adrian Prows Treating Daryan Cagley/Extender: Skipper Cliche in Treatment: 29 Vital Signs Height(in): 9 Pulse(bpm): 99 Weight(lbs): 150 Blood Pressure(mmHg): 156/82 Body Mass Index(BMI): 27 Temperature(F): 98.6 Respiratory Rate(breaths/min): 18 Photos: [N/A:N/A] Wound Location: Right, Lateral Malleolus N/A  N/A Wounding Event: Gradually Appeared N/A N/A Primary Etiology: Pressure Ulcer N/A N/A Comorbid History: Cataracts, Congestive Heart Failure, N/A N/A Hypertension, Peripheral Arterial Disease, Osteoarthritis, Neuropathy Date Acquired: 05/12/2021 N/A N/A Weeks of Treatment: 29 N/A N/A Wound Status: Open N/A N/A Measurements L x W x D (cm) 0.5x0.6x0.2 N/A N/A Area (cm) : 0.236 N/A N/A Volume (cm) : 0.047 N/A N/A % Reduction in Area: 79.00% N/A N/A % Reduction in Volume: 79.10% N/A N/A Classification: Category/Stage III N/A N/A Exudate Amount: Medium N/A N/A Exudate Type: Serosanguineous N/A N/A Exudate Color: red, brown N/A N/A Granulation Amount: Large (67-100%) N/A N/A Granulation Quality: Red N/A N/A Necrotic Amount: None Present (0%) N/A N/A Exposed Structures: Fat Layer (Subcutaneous Tissue): N/A N/A Yes Fascia: No Tendon: No Muscle: No Joint: No Bone: No Epithelialization: None N/A N/A Treatment Notes Electronic Signature(s) Signed: 12/09/2021 4:33:01  PM By: Carlene Coria RN Entered By: Carlene Coria on 12/08/2021 11:26:47 [name] ([id number]) -------------------------------------------------------------------------------- [place] Details Patient Name: [name], [name] Date of Service: 12/08/2021 11:00 AM Medical Record Number: [id number] Patient Account Number: [id number] Date of Birth/Sex: 1922/12/16 (88 y.o. F) Treating RN: Carlene Coria Primary Care Shaili Donalson: Adrian Prows Other Clinician: Referring Zyrell Carmean: Adrian Prows Treating Vietta Bonifield/Extender: Skipper Cliche in Treatment: 67 Active Inactive Electronic Signature(s) Signed: 12/09/2021 4:33:01 PM By: Carlene Coria RN Entered By: Carlene Coria on 12/08/2021 11:26:22 [name] ([id number]) -------------------------------------------------------------------------------- Pain Assessment Details Patient Name: [name] Date of Service: 12/08/2021 11:00  AM Medical Record Number: [id number] Patient Account Number: [id number] Date of Birth/Sex: May 03, 1923 (88 y.o. F) Treating RN: Carlene Coria Primary Care Francia Verry: Adrian Prows Other Clinician: Referring Angella Montas: Adrian Prows Treating Talton Delpriore/Extender: Skipper Cliche in Treatment: 29 Active Problems Location of Pain Severity and Description of Pain Patient Has Paino No Site Locations Pain Management and Medication Current Pain Management: Electronic Signature(s) Signed: 12/09/2021 4:33:01 PM By: Carlene Coria RN Entered By: Carlene Coria on 12/08/2021 11:12:46 Heeg, [name] ([id number]) -------------------------------------------------------------------------------- Patient/Caregiver Education Details Patient Name: [name] Date of Service: 12/08/2021 11:00 AM Medical Record Number: [id number] Patient Account Number: [id number] Date of Birth/Gender: 23-Dec-1922 (88 y.o. F) Treating RN: Carlene Coria Primary Care Physician: Adrian Prows Other Clinician: Referring Physician: Adrian Prows Treating Physician/Extender: Skipper Cliche in Treatment: 74 Education Assessment Education Provided To: Patient Education Topics Provided Wound/Skin Impairment: Methods: Explain/Verbal Responses: State content correctly Electronic Signature(s) Signed: 12/09/2021 4:33:01 PM By: Carlene Coria RN Entered By: Carlene Coria on 12/08/2021 11:32:54 Riga, [name] ([id number]) -------------------------------------------------------------------------------- Wound Assessment Details Patient Name: [name] Date of Service: 12/08/2021 11:00 AM Medical Record Number: [id number] Patient Account Number: [id number] Date of Birth/Sex: 11/25/1923 (88 y.o. F) Treating RN: Carlene Coria Primary Care Renai Lopata: Adrian Prows Other Clinician: Referring Teleshia Lemere: Adrian Prows Treating Kadience Macchi/Extender: Skipper Cliche in Treatment: 29 Wound Status Wound  Number: 7 Primary Pressure Ulcer Etiology: Wound Location: Right, Lateral Malleolus Wound Open Wounding Event: Gradually Appeared Status: Date Acquired: 05/12/2021 Comorbid Cataracts, Congestive Heart Failure, Hypertension, Weeks Of Treatment: 29 History: Peripheral Arterial Disease, Osteoarthritis, Neuropathy Clustered Wound: No Photos Wound Measurements Length: (cm) 0.5 Width: (cm) 0.6 Depth: (cm) 0.2 Area: (cm) 0.236 Volume: (cm) 0.047 % Reduction in Area: 79% % Reduction in Volume: 79.1% Epithelialization: None Tunneling: No Undermining: No Wound Description Classification: Category/Stage III Exudate Amount: Medium Exudate Type: Serosanguineous Exudate Color: red, brown Foul Odor After Cleansing: No Slough/Fibrino No Wound Bed Granulation Amount: Large (67-100%) Exposed Structure Granulation Quality: Red Fascia Exposed: No Necrotic Amount: None Present (0%) Fat Layer (Subcutaneous Tissue) Exposed: Yes Tendon Exposed: No Muscle Exposed: No Joint Exposed: No Bone Exposed: No Treatment Notes Wound #7 (Malleolus) Wound Laterality: Right, Lateral Cleanser Soap and Water Discharge Instruction: Gently cleanse wound with antibacterial soap, rinse and pat dry prior to dressing wounds Peri-Wound Care Montanari, [name] ([id number]) Topical Primary Dressing Prisma 4.34 (in) Discharge Instruction: Moisten w/normal saline or sterile water; Cover wound as directed. Do not remove from wound bed. Secondary Dressing Gauze Discharge Instruction: As directed: dry, moistened with saline or moistened with Dakins Solution Secured With Compression Wrap Profore Lite LF 3 Multilayer Compression Bandaging System Discharge Instruction: Apply 3 multi-layer wrap as prescribed. Compression Stockings Add-Ons Electronic Signature(s) Signed: 12/09/2021 4:33:01 PM By: Carlene Coria RN Entered By: Carlene Coria on 12/08/2021 11:20:49 Polidore, [name]  ([id number]) -------------------------------------------------------------------------------- Vitals Details Patient Name: [name]  Date of Service: 12/08/2021 11:00 AM Medical Record Number: [id number] Patient Account Number: [id number] Date of Birth/Sex: December 30, 1922 (88 y.o. F) Treating RN: Carlene Coria Primary Care Kiyo Heal: Adrian Prows Other Clinician: Referring Keagan Anthis: Adrian Prows Treating Skie Vitrano/Extender: Skipper Cliche in Treatment: 29 Vital Signs Time Taken: 11:11 Temperature (F): 98.6 Height (in): 62 Pulse (bpm): 71 Weight (lbs): 150 Respiratory Rate (breaths/min): 18 Body Mass Index (BMI): 27.4 Blood Pressure (mmHg): 156/82 Reference Range: 80 - 120 mg / dl Electronic Signature(s) Signed: 12/09/2021 4:33:01 PM By: Carlene Coria RN Entered By: Carlene Coria on 12/08/2021 11:12:30

## 2021-12-15 ENCOUNTER — Encounter: Payer: Medicare Other | Admitting: Physician Assistant

## 2021-12-15 ENCOUNTER — Other Ambulatory Visit: Payer: Self-pay

## 2021-12-15 DIAGNOSIS — L89513 Pressure ulcer of right ankle, stage 3: Secondary | ICD-10-CM | POA: Diagnosis not present

## 2021-12-15 NOTE — Progress Notes (Addendum)
ANNAMARIA, SALAH ([id number]) Visit Report for 12/15/2021 Chief Complaint Document Details Patient Name: [name], [name]. Date of Service: 12/15/2021 2:00 PM Medical Record Number: [id number] Patient Account Number: [id number] Date of Birth/Sex: [birth year] (88 y.o. F) Treating RN: Carlene Coria Primary Care Provider: Adrian Prows Other Clinician: Referring Provider: Adrian Prows Treating Provider/Extender: Skipper Cliche in Treatment: 30 Information Obtained from: Patient Chief Complaint Right foot ulcer Electronic Signature(s) Signed: 12/15/2021 1:31:46 PM By: Worthy Keeler PA-C Entered By: Worthy Keeler on 12/15/2021 13:31:46 Mormile, [name] ([id number]) -------------------------------------------------------------------------------- Debridement Details Patient Name: [name] Date of Service: 12/15/2021 2:00 PM Medical Record Number: [id number] Patient Account Number: [id number] Date of Birth/Sex: [birth year] (88 y.o. F) Treating RN: Carlene Coria Primary Care Provider: Adrian Prows Other Clinician: Referring Provider: Adrian Prows Treating Provider/Extender: Skipper Cliche in Treatment: 30 Debridement Performed for Wound #7 Right,Lateral Malleolus Assessment: Performed By: Physician Tommie Sams., PA-C Debridement Type: Debridement Level of Consciousness (Pre- Awake and Alert procedure): Pre-procedure Verification/Time Out Yes - 14:35 Taken: Start Time: 14:35 Total Area Debrided (L x W): 0.5 (cm) x 0.5 (cm) = 0.25 (cm) Tissue and other material Viable, Non-Viable, Slough, Subcutaneous, Biofilm, Slough debrided: Level: Skin/Subcutaneous Tissue Debridement Description: Excisional Instrument: Curette Bleeding: Minimum Hemostasis Achieved: Pressure End Time: 14:39 Procedural Pain: 0 Post Procedural Pain: 0 Response to Treatment: Procedure was tolerated well Level of Consciousness (Post- Awake and Alert procedure): Post  Debridement Measurements of Total Wound Length: (cm) 0.5 Stage: Category/Stage III Width: (cm) 0.5 Depth: (cm) 0.2 Volume: (cm) 0.039 Character of Wound/Ulcer Post Debridement: Improved Post Procedure Diagnosis Same as Pre-procedure Electronic Signature(s) Signed: 12/16/2021 9:05:45 AM By: Worthy Keeler PA-C Signed: 12/16/2021 4:26:12 PM By: Carlene Coria RN Entered By: Carlene Coria on 12/15/2021 14:37:30 Osuna, [name] ([id number]) -------------------------------------------------------------------------------- HPI Details Patient Name: [name] Date of Service: 12/15/2021 2:00 PM Medical Record Number: [id number] Patient Account Number: [id number] Date of Birth/Sex: [birth year] (88 y.o. 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 Dr. Amalia Hailey the podiatrist saw her on 07/20/2017 and sent her to the wound center for possible hyperbaric oxygen therapy. past medical history of peripheral vascular disease, varicose veins, status post appendectomy, basal cell carcinoma excision from the left leg, cholecystectomy, pacemaker placement, right lower extremity angiography done by Dr. dew in March 2017 with placement of a stent. there is also note of a successful ablation of the right small saphenous vein done which was reviewed by ultrasound on 10/24/2016. the patient had a right small saphenous vein ablation done on 10/20/2016. The patient has never been a smoker. She has been seen by Dr. Corene Cornea dew the vascular surgeon who most recently saw her on 06/15/2017 for evaluation of ongoing problems with right  leg swelling. She had a lower extremity arterial duplex examination done(02/13/17) which showed patent distal right superficial femoral artery stent and above-the-knee popliteal stent without evidence of restenosis. The ABI was more than 1.3 on the right and more than 1.3 on the left. This was consistent with noncompressible arteries due to medial calcification. The right great toe pressure and PPG waveforms are within normal limits and the left great toe pressure and PPG waveforms are decreased. he recommended  she continue to wear her compression stockings and continue with elevation. She is scheduled to have a noninvasive arterial study in the near future 08/16/2017 -- had a lower extremity arterial duplex examination done which showed patent distal right superficial femoral artery stent and above-the-knee popliteal stent without evidence of restenosis. The ABI was more than 1.3 on the right and more than 1.3 on the left. This was consistent with noncompressible arteries due to medial calcification. The right great toe pressure and PPG waveforms are within normal limits and the left great toe pressure and PPG waveforms are decreased. the x-ray of the right ankle has not yet been done 08/24/2017 -- had a right ankle x-ray -- IMPRESSION:1. No fracture, bone lesion or evidence of osteomyelitis. 2. Lateral soft tissue swelling with a soft tissue ulcer. she has not yet seen the vascular surgeon for review 08/31/17 on evaluation today patient's wound appears to be showing signs of improvement. She still with her appointment with vascular in order to review her results of her vascular study and then determine if any intervention would be recommended at that time. No fevers, chills, nausea, or vomiting noted at this time. She has been tolerating the dressing changes without complication. 09/28/17 on evaluation today patient's wound appears to show signs of good improvement in regard to the granulation  tissue which is surfacing. There is still a layer of slough covering the wound and the posterior portion is still significantly deeper than the anterior nonetheless there has been some good sign of things moving towards the better. She is going to go back to Dr. dew for reevaluation to ensure her blood flow is still appropriate. That will be before her next evaluation with [place] next week. No fevers, chills, nausea, or vomiting noted at this time. Patient does have some discomfort rated to be a 3-4/10 depending on activity specifically cleansing the wound makes it worse. 10/05/2017 -- the patient was seen by Dr. Lucky Cowboy last week and noninvasive studies showed a normal right ABI with brisk triphasic waveforms consistent with no arterial insufficiency including normal digital pressures. The duplex showed a patent distal right SFA stent and the proximal SFA was also normal. He was pleased with her test and thought she should have enough of perfusion for normal wound healing. He would see her back in 6 months time. 12/21/17 on evaluation today patient appears to be doing fairly well in regard to her right lateral ankle wound. Unfortunately the main issue that she is expansion at this point is that she is having some issues with what appears to be some cellulitis in the right anterior shin. She has also been noting a little bit of uncomfortable feeling especially last night and her ankle area. I'm afraid that she made the developing a little bit of an infection. With that being said I think it is in the early stages. 12/28/17 on evaluation today patient's ankle appears to be doing excellent. She's making good progress at this point the cellulitis seems to have improved after last week's evaluation. Overall she is having no significant discomfort which is excellent news. She does have an appointment with Dr. dew on March 29, 2018 for reevaluation in regard to the stent he placed. She seems to have excellent blood  flow in the right lower extremity. 01/19/12 on evaluation today patient's wound appears to be doing very well. In fact she does not appear to require debridement at this point, there's no evidence of infection, and overall from the standpoint  of the wound she seems to be doing very well. With that being said I believe that it may be time to switch to different dressing away from the [facility] Dressing she tells me she does have a lot going on her friend actually passed away yesterday and she's also having a lot of issues with her husband this obviously is weighing heavy on her as far as your thoughts and concerns today. 01/25/18 on evaluation today patient appears to be doing fairly well in regard to her right lateral malleolus. She has been tolerating the dressing changes without complication. Overall I feel like this is definitely showing signs of improvement as far as how the overall appearance of the wound is there's also evidence of epithelium start to migrate over the granulation tissue. In general I think that she is progressing nicely as far as the wound is concerned. The only concern she really has is whether or not we can switch to every other week visits in order to avoid having as many appointments as her daughters have a difficult time getting her to her appointments as well as the patient's husband to his he is not doing very well at this point. 02/22/18 on evaluation today patient's right lateral malleolus ulcer appears to be doing great. She has been tolerating the dressing changes without complication. Overall you making excellent progress at this time. Patient is having no significant discomfort. HAVANAH, NELMS ([id number]) 03/15/18 on evaluation today patient appears to be doing much more poorly in regard to her right lateral ankle ulcer at this point. Unfortunately since have last seen her her husband has passed just a few days ago is obviously weighed heavily on her her  daughter also had surgery well she is with her today as usual. There does not appear to be any evidence of infection she does seem to have significant contusion/deep tissue injury to the right lateral malleolus which was not noted previous when I saw her last. It's hard to tell of exactly when this injury occurred although during the time she was spending the night in the hospital this may have been most likely. 03/22/18 on evaluation today patient appears to actually be doing very well in regard to her ulcer. She did unfortunately have a setback which was noted last week however the good news is we seem to be getting back on track and in fact the wound in the core did still have some necrotic tissue which will be addressed at this point today but in general I'm seeing signs that things are on the up and up. She is glad to hear this obviously she's been somewhat concerned that due to the how her wound digressed more recently. 03/29/18 on evaluation today patient appears to be doing fairly well in regard to her right lower extremity lateral malleolus ulcer. She unfortunately does have a new area of pressure injury over the inferior portion where the wound has opened up a little bit larger secondary to the pressure she seems to be getting. She does tell me sometimes when she sleeps at night that it actually hurts and does seem to be pushing on the area little bit more unfortunately. There does not appear to be any evidence of infection which is good news. She has been tolerating the dressing changes without complication. She also did have some bruising in the left second and third toes due to the fact that she may have bump this or injured it although she has neuropathy so  she does not feel she did move recently that may have been where this came from. Nonetheless there does not appear to be any evidence of infection at this time. 04/12/18 on evaluation today patient's wound on the right lateral ankle  actually appears to be doing a little bit better with a lot of necrotic docking tissue centrally loosening up in clearing away. However she does have the beginnings of a deep tissue injury on the left lateral malleolus likely due to the fact we've been trying offload the right as much as we have. I think she may benefit from an assistive soft device to help with offloading and it looks like they're looking at one of the doughnut conditions that wraps around the lower leg to offload which I think will definitely do a good job. With that being said I think we definitely need to address this issue on the left before it becomes a wound. Patient is not having significant pain. 04/19/18 on evaluation today patient appears to be doing excellent in regard to the progress she's made with her right lateral ankle ulcer. The left ankle region which did show evidence of a deep tissue injury seems to be resolving there's little fluid noted underneath and a blister there's nothing open at this point in time overall I feel like this is progressing nicely which is good news. She does not seem to be having significant discomfort at this point which is also good news. 04/25/18-She is here in follow up evaluation for bilateral lateral malleolar ulcers. The right lateral malleolus ulcer with pale subcutaneous tissue exposure, central area of ulcer with tendon/periosteum exposed. The left lateral malleolus ulcer now with central area of nonviable tissue, otherwise deep tissue injury. She is wearing compression wraps to the left lower extremity, she will place the right lower extremity compression wraps on when she gets home. She will be out of town over the weekend and return next week and follow-up appointment. She completed her doxycycline this morning 05/03/18 on evaluation today patient appears to be doing very well in regard to her right lateral ankle ulcer in general. At least she's showing some signs of improvement in  this regard. Unfortunately she has some additional injury to the left lateral malleolus region which appears to be new likely even over the past several days. Again this determination is based on the overall appearance. With that being said the patient is obviously frustrated about this currently. 05/10/18-She is here in follow-up evaluation for bilateral lateral malleolar ulcers. She states she has purchased offloading shoes/boots and they will arrive tomorrow. She was asked to bring them in the office at next week's appointment so her provider is aware of product being utilized. She continues to sleep on right or left side, she has been encouraged to sleep on her back. The right lateral malleolus ulcer is precariously close to peri-osteum; will order xray. The left lateral malleolus ulcer is improved. Will switch back to santyl; she will follow up next week. 05/17/18 on evaluation today patient actually appears to be doing very well in regard to her malleolus her ulcers compared to last time I saw them. She does not seem to have as much in the way of contusion at this point which is great news. With that being said she does continue to have discomfort and I do believe that she is still continuing to benefit from the offloading/pressure reducing boots that were recommended. I think this is the key to trying to get this  to heal up completely. 05/24/18 on evaluation today patient actually appears to be doing worse at this point in time unfortunately compared to her last week's evaluation. She is having really no increased pain which is good news unfortunately she does have more maceration in your theme and noted surrounding the right lateral ankle the left lateral ankle is not really is erythematous I do not see signs of the overt cellulitis on that side. Unfortunately the wounds do not seem to have shown any signs of improvement since the last evaluation. She also has significant swelling especially on the  right compared to previous some of this may be due to infection however also think that she may be served better while she has these wounds by compression wrapping versus continuing to use the Juxta-Lite for the time being. Especially with the amount of drainage that she is experiencing at this point. No fevers, chills, nausea, or vomiting noted at this time. 05/31/18 on evaluation today patient appears to actually be doing better in regard to her right lateral lower extremity ulcer specifically on the malleolus region. She has been tolerating the antibiotic without complication. With that being said she still continues to have issues but a little bit of redness although nothing like she what she was experiencing previous. She still continues to pressure to her ankle area she did get the problem on offloading boots unfortunately she will not wear them she states there too uncomfortable and she can't get in and out of the bed. Nonetheless at this point her wounds seem to be continually getting worse which is not what we want I'm getting somewhat concerned about her progress and how things are going to proceed if we do not intervene in some way shape or form. I therefore had a very lengthy conversation today about offloading yet again and even made a specific suggestion for switching her to a memory foam mattress and even gave the information for a specific one that they could look at getting if it was something that they were interested in considering. She does not want to be considered for a hospital bed air mattress although honestly insurance would not cover it that she does not have any wounds on her trunk. 06/14/18 on evaluation today both wounds over the bilateral lateral malleolus her ulcers appear to be doing better there's no evidence of pressure injury at this point. She did get the foam mattress for her bed and this does seem to have been extremely beneficial for her in my pinion. Her daughter  states that she is having difficulty getting out of bed because of how soft it is. The patient also relates this to be. Nonetheless I do feel like she's actually doing better. Unfortunately right after and around the time she was getting the mattress she also sustained a fall when she got up to go pick up the phone and ended up injuring her right elbow she has 18 sutures in place. We are not caring for this currently although home health is going to be taking the sutures out shortly. Nonetheless this may be something that we need to evaluate going forward. It depends on how well it has or has not healed in the end. She also recently saw an orthopedic specialist for an injection in the right shoulder just before her fall unfortunately the fall seems to have worsened her pain. 06/21/18 on evaluation today patient appears to be doing about the same in regard to her lateral malleolus ulcers. Both  appear to be just a little bit deeper but again we are clinging away the necrotic and dead tissue which I think is why this is progressing towards a deeper realm as opposed TISH, BEGIN. ([id number]) to improving from my measurement standpoint in that regard. Nonetheless she has been tolerating the dressing changes she absolutely hates the memory foam mattress topper that was obtained for her nonetheless I do believe this is still doing excellent as far as taking care of excess pressure in regard to the lateral malleolus regions. She in fact has no pressure injury that I see whereas in weeks past it was week by week I was constantly seeing new pressure injuries. Overall I think it has been very beneficial for her. 07/03/18; patient arrives in my clinic today. She has deep punched out areas over her bilateral lateral malleoli. The area on the right has some more depth. We spent a lot of time today talking about pressure relief for these areas. This started when her daughter asked for a prescription for a  memory foam mattress. I have never written a prescription for a mattress and I don't think insurances would pay for that on an ordinary bed. In any case he came up that she has foam boots that she refuses to wear. I would suggest going to these before any other offloading issues when she is in bed. They say she is meticulous about offloading this the rest of the day 07/10/18- She is seen in follow-up evaluation for bilateral, lateral malleolus ulcers. There is no improvement in the ulcers. She has purchased and is sleeping on a memory foam mattress/overlay, she has been using the offloading boots nightly over the past week. She has a follow up appointment with vascular medicine at the end of October, in my opinion this follow up should be expedited given her deterioration and suboptimal TBI results. We will order plain film xray of the left ankle as deeper structures are palpable; would consider having MRI, regardless of xray report(s). The ulcers will be treated with iodoflex/iodosorb, she is unable to safely change the dressings daily with santyl. 07/19/18 on evaluation today patient appears to be doing in general visually well in regard to her bilateral lateral malleolus ulcers. She has been tolerating the dressing changes without complication which is good news. With that being said we did have an x-ray performed on 07/12/18 which revealed a slight loosen see in the lateral portion of the distal left fibula which may represent artifact but underline lytic destruction or osteomyelitis could not be excluded. MRI was recommended. With that being said we can see about getting the patient scheduled for an MRI to further evaluate this area. In fact we have that scheduled currently for August 20 19,019. 07/26/18 on evaluation today patient's wound on the right lateral ankle actually appears to be doing fairly well at this point in my pinion. She has made some good progress currently. With that being said  unfortunately in regard to the left lateral ankle ulcer this seems to be a little bit more problematic at this time. In fact as I further evaluated the situation she actually had bone exposed which is the first time that's been the case in the bone appear to be necrotic. Currently I did review patient's note from Dr. Bunnie Domino office with [facility]. He stated that ABI was 1.26 on the right and 0.95 on the left with good waveforms. Her perfusion is stable not reduced from previous studies  and her digital waveforms were pretty good particularly on the right. His conclusion upon review of the note was that there was not much she could do to improve her perfusion and he felt she was adequate for wound healing. His suggestion was that she continued to see [place] and consider a synthetic skin graft if there was no underlying infection. He plans to see her back in six months or as needed. 08/01/18 on evaluation today patient appears to be doing better in regard to her right lateral ankle ulcer. Her left lateral ankle ulcer is about the same she still has bone involvement in evidence of necrosis. There does not appear to be evidence of infection at this time On the right lateral lower extremity. I have started her on the Augmentin she picked this up and started this yesterday. This is to get her through until she sees infectious disease which is scheduled for 08/12/18. 08/06/18 on evaluation today patient appears to be doing rather well considering my discussion with patient's daughter at the end of last week. The area which was marked where she had erythema seems to be improved and this is good news. With that being said overall the patient seems to be making good improvement when it comes to the overall appearance of the right lateral ankle ulcer although this has been slow she at least is coming around in this regard. Unfortunately in regard to the left lateral ankle ulcer this is osteomyelitis  based on the pathology report as well is bone culture. Nonetheless we are still waiting CT scan. Unfortunately the MRI we originally ordered cannot be performed as the patient is a pacemaker which I had overlooked. Nonetheless we are working on the CT scan approval and scheduling as of now. She did go to the hospital over the weekend and was placed on IV Cefzo for a couple of days. Fortunately this seems to have improved the erythema quite significantly which is good news. There does not appear to be any evidence of worsening infection at this time. She did have some bleeding after the last debridement therefore I did not perform any sharp debridement in regard to left lateral ankle at this point. Patient has been approved for a snap vac for the right lateral ankle. 08/14/18; the patient with wounds over her bilateral lateral malleoli. The area on the right actually looks quite good. Been using a snap back on this area. Healthy granulation and appears to be filling in. Unfortunately the area on the left is really problematic. 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 Also noted to have cellulitis. She saw Dr. Novella Olive of infectious disease today and was put on linezolid. We are able to verify this with her pharmacy. She is completed the Augmentin that she was already on. We've been using Iodoflex to this area 08/23/18 on evaluation today patient's wounds both actually appear to be doing better compared to my prior evaluations. Fortunately she showing signs of good improvement in regard to the overall wound status especially where were using the snap vac on the right. In regard to left lateral malleolus the wound bed actually appears to be much cleaner than previously noted. I do not feel any phone directly probed during evaluation today and though there is tendon noted this does not appear to be necrotic it's actually  fairly good as far as the overall appearance of the tendon is concerned. In general  the wound bed actually appears to be doing significantly better than it was previous. Patient is currently in the care of [name] and I did review that note today. He actually has her on two weeks of linezolid and then following the patient will be on 1-2 months of Keflex. That is the plan currently. She has been on antibiotics therapy as prescribed by myself initially starting on July 30, 2018 and has been on that continuously up to this point. 08/30/18 on evaluation today patient actually appears to be doing much better in regard to her right lateral malleolus ulcer. She has been tolerating the dressing changes specifically the snap vac without complication although she did have some issues with the seal currently. Apparently there was some trouble with getting it to maintain over the past week past Sunday. Nonetheless overall the wound appears better in regard to the right lateral malleolus region. In regard to left lateral malleolus this actually show some signs of additional granulation although there still tendon noted in the base of the wound this appears to be healthy not necrotic in any way whatsoever. We are considering potentially using a snap vac for the left lateral malleolus as well the product wrap from [employer], [place], was present in the clinic today we're going to see this patient I did have her come in with me after obtaining consent from the patient and her daughter in order to look at the wound and see if there's any recommendation one way or another as to whether or not they felt the snapback could be beneficial for the left lateral malleolus region. But the conclusion was that it might be but that this is definitely a little bit deeper wound than what traditionally would be utilized for a snap vac. 09/06/18 on evaluation today patient actually appears to be doing excellent in my pinion in regard to  both ankle ulcers. She has been tolerating the dressing changes without complication which is great news. Specifically we have been using the snap vac. In regard to the right ankle I'm not even sure that this is going to be necessary for today and following as the wound has filled in quite nicely. In regard to the left ankle I do believe ONESTI, BONFIGLIO ([id number]) that we're seeing excellent epithelialization from the edge as well as granulation in the central portion the tendon is still exposed but there's no evidence of necrotic bone and in general I feel like the patient has made excellent progress even compared to last week with just one week of the snap vac. 09/11/18; this is a patient who has wounds on her bilateral lateral malleoli. Initially both of these were deep stage IV wounds in the setting of chronic arterial insufficiency. She has been revascularized. As I understand think she been using snap vacs to both of these wounds however the area on the right became more superficial and currently she is only using it on the left. Using silver collagen on the right and silver collagen under the back on the left I believe 09/19/18 on evaluation today patient actually appears to be doing very well in regard to her lateral malleolus or ulcers bilaterally. She has been tolerating the dressing changes without complication. Fortunately there does not appear to be any evidence of infection at this time. Overall I feel like she is improving in an excellent manner and I'm very pleased with the fact that everything seems to be turning towards the better for her. This has obviously  been a long road. 09/27/18 on evaluation today patient actually appears to be doing very well in regard to her bilateral lateral malleolus ulcers. She has been tolerating the dressing changes without complication. Fortunately there does not appear to be any evidence of infection at this time which is also great news. No  fevers, chills, nausea, or vomiting noted at this time. Overall I feel like she is doing excellent with the snap vac on the left malleolus. She had 40 mL of fluid collection over the past week. 10/04/18 on evaluation today patient actually appears to be doing well in regard to her bilateral lateral malleolus ulcers. She continues to tolerate the dressing changes without complication. One issue that I see is the snap vac on the left lateral malleolus which appears to have sealed off some fluid underlying this area and has not really allowed it to heal to the degree that I would like to see. For that reason I did suggest at this point we may want to pack a small piece of packing strip into this region to allow it to more effectively wick out fluid. 10/11/18 in general the patient today does not feel that she has been doing very well. She's been a little bit lethargic and subsequently is having bodyaches as well according to what she tells me today. With that being said overall she has been concerned with the fact that something may be worsening although to be honest her wounds really have not been appearing poorly. She does have a new ulcer on her left heel unfortunately. This may be pressure related. Nonetheless it seems to me to have potentially started at least as a blister I do not see any evidence of deep tissue injury. In regard to the left ankle the snap vac still seems to be causing the ceiling off of the deeper part of the wound which is in turn trapping fluid. I'm not extremely pleased with the overall appearance as far as progress from last week to this week therefore I'm gonna discontinue the snap vac at this point. 10/18/18 patient unfortunately this point has not been feeling well for the past several days. She was seen by Grayland Ormond her primary care provider who is a Librarian, academic at [facility]. Subsequently she states that she's been very weak and generally feeling malaise.  No fevers, chills, nausea, or vomiting noted at this time. With that being said bloodwork was performed at the PCP office on the 11th of this month which showed a white blood cell count of 10.7. This was repeated today and shows a white blood cell count of 12.4. This does show signs of worsening. Coupled with the fact that she is feeling worse and that her left ankle wound is not really showing signs of improvement I feel like this is an indication that the osteomyelitis is likely exacerbating not improving. Overall I think we may also want to check her C-reactive protein and sedimentation rate. Actually did call [facility] office this afternoon while the patient was in the office here with me. Subsequently based on the findings we discussed treatment possibilities and I think that it is appropriate for [place] to go ahead and initiate treatment with doxycycline which I'm going to do. Subsequently he did agree to see about adding a CRP and sedimentation rate to her orders. If that has not already been drawn to where they can run it they will contact the patient she can come back to have that check. They  are in agreement with plan as far as the patient and her daughter are concerned. Nonetheless also think we need to get in touch with Dr. Henreitta Leber office to see about getting the patient scheduled with him as soon as possible. 11/08/18 on evaluation today patient presents for follow-up concerning her bilateral foot and ankle ulcers. I did do an extensive review of her chart in epic today. Subsequently she was seen by [name] he did initiate Cefepime IV antibiotic therapy. Subsequently she had some issues with her PICC line this had to be removed because it was coiled and then replaced. Fortunately that was now settled. Unfortunately she has continued have issues with her left heel as well as the issues that she is experiencing with her bilateral lateral malleolus regions. I do believe however both  areas seem to be doing a little bit better on evaluation today which is good news. No fevers, chills, nausea, or vomiting noted at this time. She actually has an angiogram schedule with Dr. dew on this coming Monday, November 11, 2018. Subsequently the patient states that she is feeling much better especially than what she was roughly 2 weeks ago. She actually had to cancel an appointment because she was feeling so poorly. No fevers, chills, nausea, or vomiting noted at this time. 11/15/18 on evaluation today patient actually is status post having had her angiogram with Dr. dew Monday, four days ago. 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 He had to go and work on several areas of the vasculature fortunately he was able to obtain no more than a 30% residual stenosis throughout post procedure. I reviewed this note today. I think this will definitely help with healing at this time. Fortunately there does not appear to be any signs of infection and I do feel like ratio already has a better appearance to it. 11/22/18 upon evaluation today patient actually appears to be doing very well in regard to her wounds in general. The right lateral malleolus looks excellent the heel looks better in the left lateral malleolus also appears to be doing a little better. With that being said the right second toe actually appears to be open and training we been watching this is been dry and stable but now is open. 12/03/2018 Seen today for follow-up and management of multiple bilateral lower extremity wounds. New pressure injury of the great toe which is closed at this time. Wound of the right distal second toe appears larger today with deep undermining and a pocket of fluid present within the undermining region. Left and right malleolus is wounds are stable today with no signs and symptoms of infection.Denies any needs or concerns during exam today. 12/13/18 on evaluation today patient appears  to be doing somewhat better in regard to her left heel ulcer. She also seems to be completely healed in regard to the right lateral malleolus ulcer. The left malleolus ulcer is smaller what unfortunately the wounds which are new over the first and second toes of the right foot are what are most concerning at this point especially the second. Both areas did require sharp debridement today. 12/20/18 on evaluation today patient's wound actually appears to be doing better in regard to left lateral ankle and her right lateral ankle continues to remain healed. The hill ulcer on the left is improved. She does have improvement noted as well in regard to both toe ulcers. Overall I'm very pleased in this regard. No fevers, chills,  nausea, or vomiting noted at this time. 12/23/18 on evaluation today patient is seen after she had her toenails trimmed at the podiatrist office due to issues with her right great toe. There was what appeared to be dark eschar on the surface of the wound which had her in the podiatrist concerned. Nonetheless as I remember that during the last office visit I had utilize silver nitrate of this area I was much less concerned about the situation. Subsequently I was able to clean off much of this tissue without any complication today. This does not appear to show any signs of infection and actually look somewhat better Hakim, [name] ([id number]) compared to last time post debridement. Her second toe on the right foot actually had callous over and there did appear still be some fluid underneath this that would require debridement today. 12/27/18 on evaluation today patient actually appears to be showing signs of improvement at all locations. Even the left lateral ankle although this is not quite as great as the other sites. Fortunately there does not appear to be any signs of infection at this time and both of her toes on the right foot seem to be showing signs of improvement which is  good news and very pleased in this regard. 01/03/19 on evaluation today patient appears to be doing better for the most part in regard to her wounds in particular. There does not appear to be any evidence of infection at this time which is good news. Fortunately there is no sign of really worsening anywhere except for the right great toe which she does have what appears to be a bruise/deep tissue injury which is very superficial and already resolving. I'm not sure where this came from I questioned her extensively and she does not recall what may have happened with this. Other than that the patient seems to be doing well even the left lateral ankle ulcer looks good and is getting smaller. 01/10/19 on evaluation today patient appears to be doing well in regard to her left heel wound and both of her toe wounds. Overall I feel like there is definitely improvement here and I'm happy in that regard. With that being said unfortunately she is having issues with the left lateral malleolus ulcer which unfortunately still has a lot of depth to it. This is gonna be a very difficult wound for [place] to be able to truly get to heal. I may want to consider some type of skin substitute to see if this would be of benefit for her. I'll discuss this with her more the next visit most likely. This was something I thought about more at the end of the visit when I was Artie out of the room and the patient had been discharged. 01/17/19 on evaluation today patient appears to be doing very well in regard to her wounds in general. She's been making excellent progress at this time. Fortunately there's no sign of infection at this time either. No fevers, chills, nausea, or vomiting noted at this time. The biggest issue is still her left lateral malleolus where it appears to be doing well and is getting smaller but still shows a small corner where this is deeper and goes down into what appears to be the joint space. Nonetheless this is  taking much longer to heal although it still looks better in smaller than previous evaluations. 01/24/19 on evaluation today patient's wounds actually appear to be doing rather well in general overall. She did require some sharp  debridement in regard to the right great toe but everything else appears to be doing excellent no debridement was even necessary. No fevers, chills, nausea, or vomiting noted at this time. 01/31/19 on evaluation today patient actually appears to be doing much better in regard to her left foot wound on the heel as well as the ankle. The right great toe appears to be a little bit worse today this had callous over and trapped a lot of fluid underneath. Fortunately there's no signs of infection at any site which is great news. 02/07/19 on evaluation today patient actually appears to be doing decently well in regard to all of her ulcers at this point. No sharp debridement was required she is a little bit of hyper granulation in regard to the left lateral ankle as well as the left heel but the hill itself is almost completely healed which is excellent news. Overall been very pleased in this regard. 02/14/19 on evaluation today patient actually appears to be doing very well in regard to her ulcers on the right first toe, left lateral malleolus, and left heel. In fact the heel is almost completely healed at this point. The patient does not show any signs of infection which is good news. Overall very pleased with how things have progressed. 04/18/19 Telehealth Evaluation During the COVID-19 National Emergency: Verbal Consent: Obtained from patient Allergies: reviewed and the active list is current. Medication changes: patient has no current medication changes. COVID-19 Screening: 1. Have you traveled internationally or on a cruise ship in the last 14 dayso No 2. Have you had contact with someone with or under investigation for COVID-19o No 3. Have you had a fever, cough, sore throat,  or experiencing shortness of breatho No on evaluation today actually did have a visit with this patient through a telehealth encounter with her home health nurse. Subsequently it was noted that the patient actually appears to be doing okay in regard to her wounds both the right great toe as well as the left lateral malleolus have shown signs of improvement although this in your theme around the left lateral malleolus there eschar coverings for both locations. The question is whether or not they are actually close and whether or not home health needs to discharge the patient or not. Nonetheless my concern is this point obviously is that without actually seeing her and being able to evaluate this directly I cannot ensure that she is completely healed which is the question that I'm being asked. 04/22/19 on evaluation today patient presents for her first evaluation since last time I saw her which was actually February 14, 2019. I did do a telehealth visit last week in which point it was questionable whether or not she may be healed and had to bring her in today for confirmation. With that being said she does seem to be doing quite well at this point which is good news. There does not appear to be any drainage in the deed I believe her wounds may be healed. Readmission: 09/04/2019 on evaluation today patient appears to be doing unfortunately somewhat more poorly in regard to her left foot ulcer secondary to a wound that began on 08/21/2019 at least when she first noticed this. Fortunately she has not had any evidence of active infection at this time. Systemically. I also do not necessarily see any evidence of infection at the blister/wound site on the first metatarsal head plantar aspect. This almost appears to be something that may have just rubbed inappropriately  causing this to breakdown. They did not want a wait too long to come in to be seen as again she had significant issues in the past with wounds that  took quite a while to heal in fact it was close to 2 years. Nonetheless this does not appear to be quite that bad but again we do need to remove some of the necrotic tissue from the surface of the wound to tell exactly the extent. She does not appear to have any significant arterial disease at this point and again her last ABIs and TBI's are recorded above in the alert section her left ABI was 1.27 with a TBI of 0.72 to the right ABI 1.08 with a TBI of 0.39. Other than this the patient has been doing quite well since I last saw her and that was in May 2020. 09/11/2019 on evaluation today patient appeared to be doing very well with regard to her plantar foot ulcer on the left. In fact this appears to be almost completely healed which is awesome. That is after just 1 week of intervention. With that being said there is no signs of active infection at this time. SHANTRELL, PLACZEK ([id number]) 09/18/2019 on evaluation today patient actually appears to be doing excellent in fact she is completely healed based on what I am seeing at this point. Fortunately there is no signs of active infection at this time and overall patient is very pleased to hear that this area has healed so quickly. Readmission: 05/13/2021 upon evaluation today this patient presents for reevaluation here in the clinic. This is a wound that actually we previously took care of. She had 1 on the right ankle and the left the left turned out to be be harder due to to heal but nonetheless is doing great at this point as the right that has reopened and it was noted first just several weeks ago with a scab over it and came off in just the past few days. Fortunately there does not appear to be any obvious evidence of significant active infection at this time which is great news. No fevers, chills, nausea, vomiting, or diarrhea. 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 There does not appear to be  any signs of this interfering in any way with her wound. She does have swelling we previously had compression socks for her ordered but again it does not look like she wears these on a regular basis by any means. 05/26/2021 upon evaluation today patient appears to be doing well with regard to her wound which is actually showing signs of excellent improvement. There does not appear to be any signs of active infection which is great news and overall very pleased with where things stand today. No fevers, chills, nausea, vomiting, or diarrhea. 06/02/2021 upon evaluation today patient's wound actually showing signs of excellent improvement. Fortunately there does not appear to be any signs of active infection which is great news. I think the patient is making good progress with regard to her wounds in general. 06/09/2021 upon evaluation today patient appears to be doing excellent in regard to her wounds currently. Fortunately there does not appear to be any signs of active infection which is great news. No fevers, chills, nausea, vomiting, or diarrhea. Overall extremely pleased with where things stand today. I think the patient is making excellent progress. 06/16/2021 upon evaluation today patient appears to be doing well in regard to her wound. This  is going require little bit of debridement today and that was discussed with the patient. Otherwise she seems to be doing quite well and I am actually very pleased with where things stand at this point. No fevers, chills, nausea, vomiting, or diarrhea. 06/23/2021 upon evaluation today patient appears to be doing well with regard to her wounds. She has been tolerating the dressing changes without complication. Fortunately there does not appear to be any evidence of infection and she has not had air in her home which she actually lives at an assisted living that got fixed this morning. With that being said because of that her wrap has been extremely hot and  bothersome for her over the past week. 06/30/2021 upon evaluation today patient is actually making excellent progress in regard to her ankle ulcer. She has been tolerating the dressing changes without complication and overall extremely pleased with where things stand there does not appear to be any evidence of active infection which is great news. No fevers, chills, nausea, vomiting, or diarrhea. 07/07/21 upon evaluation today patients and culture on the right actually appears to be doing quite well. There does not appear to be any signs of infection and overall very pleased with where things stand today. No fevers, chills, nausea, or vomiting noted at this time. 07/14/2021 unfortunately the patient today has some evidence of deep tissue injury and pressure getting to the ankle region. Again I am not exactly sure what is going on here but this is very similar to issues that we have had in the past. I explained to the patient that she needs to be very mindful of exactly what is happening I think sleeping in bed is probably the main issue here although there could be other culprits I am not sure what else would potentially lead to this kind of a problem for her. 07/21/2021 upon evaluation today patient's wound actually showing signs of improvement compared to last week. Fortunately there does not appear to be any signs of active infection which is great news and overall very pleased with where things stand in that regard. With that being said I do believe that she is continuing to show signs of overall of getting better although I think this is still basically about what we were 2 weeks ago due to the worsening and now improvement. 07/28/2021 upon evaluation today patient appears to be doing well with regard to her wound. She does have some slough buildup on the surface of the wound which I would have to manage today. Fortunately there is no sign of active infection at this time. No fevers, chills, nausea,  vomiting, or diarrhea. 08/04/2021 upon evaluation today patient appears to be doing about the same in regard to her wound. To be perfectly honest I am beginning to be a little bit concerned about the overall appearance of the wound bed. I do think possibly taking a sample right around the margin of the wound could be beneficial for her as far as identifying anything such as an inflammatory process or to be honest even a skin tag cancer type process that may be of concern here. Fortunately there does not appear to be any evidence of active infection at this time which is great news she is not having any pain also great news. 08/11/2021 upon evaluation today patient appears to be doing well with regard to her wound. The good news is I did review her biopsy results and it showed some inflammatory mixed findings but nothing that  appeared to be malignant which is great news. Overall this is more of a chronic venous stasis type issue which again is more what we have been treating. Nonetheless I just wanted to make sure before going forward that there was not anything more untoward going on at this point. 08/18/2021 upon evaluation today patient appears to be doing well with regard to her ankle ulcer. Fortunately there does not appear to be any signs of active infection at this time which is great overall wound is dramatically improved compared to last week. Since last week I have actually placed her on doxycycline and subsequently this is a good option as far as the findings are concerned at this point. 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 the doxycycline is doing that. There does not appear to be any evidence of active infection systemically which is great news. 08/25/2021 upon evaluation today patient appears to be doing well with regard to her wound. I feel like we are finally  get back on track as far as healing is concerned I am much happier with the overall appearance today. I do think that she is tolerating the dressing changes without complication which is great news. We have been using Hydrofera Blue which I think is a good option. The good news is she is also doing great in regard to her compression sock on the left which is a zipper compression that seems to be doing a great job keeping her edema under good control. 09/01/2021 upon evaluation today patient appears to be doing well with regard to her wound. Infection seems to be under much better control which is great news and very pleased in that regard. Fortunately there does not appear to be any signs of infection currently. MARTHELLA, OSORNO ([id number]) 09/08/2021 upon evaluation today patient actually appears to be making good progress in regard to her wound. She has been tolerating the dressing changes without complication. Fortunately there does not appear to be any evidence of active infection at this time which is great news as well. No fevers, chills, nausea, vomiting, or diarrhea. 09/22/2021 upon evaluation today patient appears to be doing well with regard to her wound although is very slow to heal. We have not looked into Apligraf yet I think that is something that we should see about doing. 09/29/2021 upon evaluation today patient's wound is actually showing signs of doing about the same. I am not seeing any evidence of worsening but also no significant evidence of improvement. We did gain approval for the organogenesis products all except for Apligraf as covered by her insurance. With that being said I do think that we can go ahead and proceed with the NuShield if the patient and her family in agreement of the plan I discussed that with him today she does have a 20% coinsurance which we also discussed. 10/06/2021 upon evaluation today patient appears to unfortunately be doing a little bit worse she  appears to be infected based on what I am seeing. Fortunately there does not appear to be any signs of active infection at this time which is great news. Unfortunately it does appear to be some evidence of around the wound edge indicated by way of erythema and warmth as well as redness 10/13/2021 upon evaluation today patient actually appears to be doing excellent in regard to her ankle ulcer compared  to what it was. 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 I think were fine to go ahead and apply the NuShield today. 10/20/2021 upon evaluation today patient appears to be doing excellent in fact the NuShield seems to have done all some for her thus far. I am actually very pleased with where we stand and overall I think that she is making great progress. There is no evidence of active infection at this time. 11/23; patient presents for follow-up. She has no issues or complaints today. She denies signs of infection. She reports stability in her wound healing. 11/03/2021 upon evaluation today patient appears to be doing well with regard to her wounds. She has been tolerating the dressing changes without complication. Fortunately there is no signs of active infection at this time. 11/10/2021 upon evaluation today patient appears to be doing well with regard to her right ankle which is actually showing signs of improvement with the NuShield I am very pleased. Subsequently in regards to the left ankle this appears to be doing okay with no evidence of issue here either. With that being said she has a significant contusion on the left leg from having fallen 2 days ago. She tells me that she was using her walker going to the closet and then when she got to the closet turned around to get something out at which point  she fell according to the story. Nonetheless she does have a hematoma just below her knee on the anterior portion of her shin. My hope is that this will not open until wound although we do need I think Some compression over it also think that we need to have her use an ice as well to help with the swelling and prevent this from getting worse. The last thing she needs is a wound opened up here. 11/17/2021 upon evaluation patient's right ankle actually showing signs of improvement based on what I see currently there is a lot of new skin growth coming in which is great news. Nonetheless I do feel like that the patient is showing improvement as well in regard to her left leg with the use of the Tubigrip it swollen but not as bruised as what I would have expected after what I was seeing last week. Nonetheless I do think doubling up on the Tubigrip would probably be beneficial to try to keep some of the edema under better control here. 11/24/2021 upon evaluation today patient appears to be doing excellent in regard to her wound. This is actually measuring significantly smaller which is great news. Fortunately I do not see any signs of active infection locally nor systemically at this point. Overall I am very pleased with how the NuShield is doing. 12/30; wound bed looks healthy however not much change in overall wound volume. We applied Nushield again today in the standard fashion 12/08/2021 upon evaluation today patient's wound actually showing signs of good improvement and I am actually very pleased with where we stand today as well. I do not see any evidence of active infection locally nor systemically which is great news. Unfortunately she has been having some issues with stomach upset and diarrhea today. 12/15/2021 upon evaluation today patient appears to be doing excellent in regard to her wound. There is a little bit of dry skin raised  up around the edges of the wound but fortunately nothing too  significant at this point. Fortunately I do not see any evidence of active infection either which is great news. No fevers, chills, nausea, vomiting, or diarrhea. Electronic Signature(s) Signed: 12/15/2021 2:41:46 PM By: Worthy Keeler PA-C Entered By: Worthy Keeler on 12/15/2021 14:41:46 [place], [name] ([id number]) -------------------------------------------------------------------------------- Physical Exam Details Patient Name: [name] Date of Service: 12/15/2021 2:00 PM Medical Record Number: [id number] Patient Account Number: [id number] Date of Birth/Sex: May 11, 1923 (88 y.o. F) Treating RN: Carlene Coria Primary Care Provider: Adrian Prows Other Clinician: Referring Provider: Adrian Prows Treating Provider/Extender: Skipper Cliche in Treatment: 47 Constitutional Well-nourished and well-hydrated in no acute distress. Respiratory normal breathing without difficulty. Psychiatric this patient is able to make decisions and demonstrates good insight into disease process. Alert and Oriented x 3. pleasant and cooperative. Notes Patient's wound bed did require little bit of sharp debridement which I was able to do today without complication postdebridement wound bed appears to be doing much better which is great news and overall I am extremely pleased with where we stand at this point. Electronic Signature(s) Signed: 12/15/2021 2:42:04 PM By: Worthy Keeler PA-C Entered By: Worthy Keeler on 12/15/2021 14:42:04 Stene, [name] ([id number]) -------------------------------------------------------------------------------- Physician Orders Details Patient Name: [name] Date of Service: 12/15/2021 2:00 PM Medical Record Number: [id number] Patient Account Number: [id number] Date of Birth/Sex: Nov 27, 1923 (88 y.o. 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 Bathing/ Shower/ Hygiene o May shower with wound dressing protected with water repellent cover or cast protector. Edema Control - Lymphedema / Segmental Compressive Device / Other o Optional: One layer of unna paste to top of compression wrap (to act as an anchor). - and at toes o Patient to wear own compression stockings. Remove compression stockings every night before going to bed and put on every morning when getting up. - left leg o Elevate, Exercise Daily and Avoid Standing for Long Periods of Time. o Elevate legs to the level of the heart and pump ankles as often as possible o Elevate leg(s) parallel to the floor when sitting. Wound Treatment Wound #7 - Malleolus Wound Laterality: Right, Lateral Cleanser: Soap and Water 1 x Per Week/30 Days Discharge Instructions: Gently cleanse wound with antibacterial soap, rinse and pat dry prior to dressing wounds Primary Dressing: Prisma 4.34 (in) 1 x Per Week/30 Days Discharge Instructions: Moisten w/normal saline or sterile water; Cover wound as directed. Do not remove from wound bed. Secondary Dressing: Gauze 1 x Per Week/30 Days Discharge Instructions: As directed: dry, moistened with saline or moistened with Dakins Solution Compression Wrap: Profore Lite LF 3 Multilayer Compression Bandaging System 1 x Per Week/30 Days Discharge Instructions: Apply 3 multi-layer wrap as prescribed. Electronic Signature(s) Signed: 12/16/2021 9:05:45 AM By: Worthy Keeler PA-C Signed: 12/16/2021 4:26:12 PM By: Carlene Coria RN Entered By:  Carlene Coria on 12/15/2021 14:37:58 [place], [name] ([id number]) -------------------------------------------------------------------------------- Problem List Details Patient Name: [name], [name] Date of Service: 12/15/2021 2:00 PM Medical Record Number: [id number] Patient Account Number: [id number] Date of Birth/Sex: 11-Sep-1923 (88  y.o. 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, stage 3 05/13/2021 No Yes I89.0 Lymphedema, not elsewhere classified 05/13/2021 No Yes I87.2 Venous insufficiency (chronic) (peripheral) 05/13/2021 No Yes Z95.0 Presence of cardiac pacemaker 05/13/2021 No Yes Z79.01 Long term (current) use of anticoagulants 05/13/2021 No Yes Inactive Problems ICD-10 Code Description Active Date Inactive Date S80.12XA Contusion of left lower leg, initial encounter 11/10/2021 11/10/2021 Resolved Problems Electronic Signature(s) Signed: 12/15/2021 1:31:39 PM By: Worthy Keeler PA-C Entered By: Worthy Keeler on 12/15/2021 13:31:39 [place], [name] ([id number]) -------------------------------------------------------------------------------- Progress Note Details Patient Name: [name] Date of Service: 12/15/2021 2:00 PM Medical Record Number: [id number] Patient Account Number: [id number] Date of Birth/Sex: Nov 23, 1923 (88 y.o. 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 Dr. Amalia Hailey the podiatrist saw her on 07/20/2017 and sent her to the wound center for possible hyperbaric oxygen therapy. past medical history of peripheral vascular disease, varicose veins, status post appendectomy, basal cell carcinoma excision from the left leg, cholecystectomy, pacemaker placement, right lower extremity angiography done by Dr. dew in March 2017 with placement of a stent. there is also note of a successful ablation of the right small saphenous vein done which was reviewed by ultrasound on 10/24/2016. the patient had a right small saphenous vein ablation done on 10/20/2016. The patient has never been a smoker. She has been seen by Dr. Corene Cornea dew the vascular surgeon who most recently saw her on 06/15/2017 for evaluation of ongoing problems with right leg swelling. She had a lower extremity arterial duplex examination done(02/13/17) which showed patent distal right superficial femoral artery stent and above-the-knee popliteal stent without evidence of restenosis. The ABI was more than 1.3 on the right and more than 1.3 on the left. This was consistent with noncompressible arteries due to medial calcification. The right great toe pressure and PPG waveforms are within normal limits and the left great toe pressure and PPG waveforms are decreased. he recommended she continue to wear her compression stockings and continue with elevation. She is scheduled to have a noninvasive arterial study in the near future 08/16/2017 -- had a lower extremity arterial duplex examination done which showed patent distal right superficial femoral artery stent and above-the-knee popliteal stent without evidence of restenosis. The ABI was more than 1.3 on the right and more than 1.3 on the left. This was consistent with noncompressible arteries due to medial calcification. The right great toe  pressure and PPG waveforms are within normal limits and the left great toe pressure and PPG waveforms are decreased. the x-ray of the right ankle has not yet been done 08/24/2017 -- had a right ankle x-ray -- IMPRESSION:1. No fracture, bone lesion or evidence of osteomyelitis. 2. Lateral soft tissue swelling with a soft tissue ulcer. she has not yet seen the vascular surgeon for review 08/31/17 on evaluation today patient's wound appears to be showing signs of improvement. She still with her appointment with vascular in order to review her  results of her vascular study and then determine if any intervention would be recommended at that time. No fevers, chills, nausea, or vomiting noted at this time. She has been tolerating the dressing changes without complication. 09/28/17 on evaluation today patient's wound appears to show signs of good improvement in regard to the granulation tissue which is surfacing. There is still a layer of slough covering the wound and the posterior portion is still significantly deeper than the anterior nonetheless there has been some good sign of things moving towards the better. She is going to go back to Dr. dew for reevaluation to ensure her blood flow is still appropriate. That will be before her next evaluation with [place] next week. No fevers, chills, nausea, or vomiting noted at this time. Patient does have some discomfort rated to be a 3-4/10 depending on activity specifically cleansing the wound makes it worse. 10/05/2017 -- the patient was seen by Dr. Lucky Cowboy last week and noninvasive studies showed a normal right ABI with brisk triphasic waveforms consistent with no arterial insufficiency including normal digital pressures. The duplex showed a patent distal right SFA stent and the proximal SFA was also normal. He was pleased with her test and thought she should have enough of perfusion for normal wound healing. He would see her back in 6 months time. 12/21/17 on  evaluation today patient appears to be doing fairly well in regard to her right lateral ankle wound. Unfortunately the main issue that she is expansion at this point is that she is having some issues with what appears to be some cellulitis in the right anterior shin. She has also been noting a little bit of uncomfortable feeling especially last night and her ankle area. I'm afraid that she made the developing a little bit of an infection. With that being said I think it is in the early stages. 12/28/17 on evaluation today patient's ankle appears to be doing excellent. She's making good progress at this point the cellulitis seems to have improved after last week's evaluation. Overall she is having no significant discomfort which is excellent news. She does have an appointment with Dr. dew on March 29, 2018 for reevaluation in regard to the stent he placed. She seems to have excellent blood flow in the right lower extremity. 01/19/12 on evaluation today patient's wound appears to be doing very well. In fact she does not appear to require debridement at this point, there's no evidence of infection, and overall from the standpoint of the wound she seems to be doing very well. With that being said I believe that it may be time to switch to different dressing away from the [facility] Dressing she tells me she does have a lot going on her friend actually passed away yesterday and she's also having a lot of issues with her husband this obviously is weighing heavy on her as far as your thoughts and concerns today. 01/25/18 on evaluation today patient appears to be doing fairly well in regard to her right lateral malleolus. She has been tolerating the dressing changes without complication. Overall I feel like this is definitely showing signs of improvement as far as how the overall appearance of the wound is there's also evidence of epithelium start to migrate over the granulation tissue. In general I think  that she is progressing nicely as far as the wound is concerned. The only concern she really has is whether or not we can switch to every other week visits in order to avoid  having as many KISHANA, BATTEY ([id number]) appointments as her daughters have a difficult time getting her to her appointments as well as the patient's husband to his he is not doing very well at this point. 02/22/18 on evaluation today patient's right lateral malleolus ulcer appears to be doing great. She has been tolerating the dressing changes without complication. Overall you making excellent progress at this time. Patient is having no significant discomfort. 03/15/18 on evaluation today patient appears to be doing much more poorly in regard to her right lateral ankle ulcer at this point. Unfortunately since have last seen her her husband has passed just a few days ago is obviously weighed heavily on her her daughter also had surgery well she is with her today as usual. There does not appear to be any evidence of infection she does seem to have significant contusion/deep tissue injury to the right lateral malleolus which was not noted previous when I saw her last. It's hard to tell of exactly when this injury occurred although during the time she was spending the night in the hospital this may have been most likely. 03/22/18 on evaluation today patient appears to actually be doing very well in regard to her ulcer. She did unfortunately have a setback which was noted last week however the good news is we seem to be getting back on track and in fact the wound in the core did still have some necrotic tissue which will be addressed at this point today but in general I'm seeing signs that things are on the up and up. She is glad to hear this obviously she's been somewhat concerned that due to the how her wound digressed more recently. 03/29/18 on evaluation today patient appears to be doing fairly well in regard to her right lower  extremity lateral malleolus ulcer. She unfortunately does have a new area of pressure injury over the inferior portion where the wound has opened up a little bit larger secondary to the pressure she seems to be getting. She does tell me sometimes when she sleeps at night that it actually hurts and does seem to be pushing on the area little bit more unfortunately. There does not appear to be any evidence of infection which is good news. She has been tolerating the dressing changes without complication. She also did have some bruising in the left second and third toes due to the fact that she may have bump this or injured it although she has neuropathy so she does not feel she did move recently that may have been where this came from. Nonetheless there does not appear to be any evidence of infection at this time. 04/12/18 on evaluation today patient's wound on the right lateral ankle actually appears to be doing a little bit better with a lot of necrotic docking tissue centrally loosening up in clearing away. However she does have the beginnings of a deep tissue injury on the left lateral malleolus likely due to the fact we've been trying offload the right as much as we have. I think she may benefit from an assistive soft device to help with offloading and it looks like they're looking at one of the doughnut conditions that wraps around the lower leg to offload which I think will definitely do a good job. With that being said I think we definitely need to address this issue on the left before it becomes a wound. Patient is not having significant pain. 04/19/18 on evaluation today patient appears to be  doing excellent in regard to the progress she's made with her right lateral ankle ulcer. The left ankle region which did show evidence of a deep tissue injury seems to be resolving there's little fluid noted underneath and a blister there's nothing open at this point in time overall I feel like this is  progressing nicely which is good news. She does not seem to be having significant discomfort at this point which is also good news. 04/25/18-She is here in follow up evaluation for bilateral lateral malleolar ulcers. The right lateral malleolus ulcer with pale subcutaneous tissue exposure, central area of ulcer with tendon/periosteum exposed. The left lateral malleolus ulcer now with central area of nonviable tissue, otherwise deep tissue injury. She is wearing compression wraps to the left lower extremity, she will place the right lower extremity compression wraps on when she gets home. She will be out of town over the weekend and return next week and follow-up appointment. She completed her doxycycline this morning 05/03/18 on evaluation today patient appears to be doing very well in regard to her right lateral ankle ulcer in general. At least she's showing some signs of improvement in this regard. Unfortunately she has some additional injury to the left lateral malleolus region which appears to be new likely even over the past several days. Again this determination is based on the overall appearance. With that being said the patient is obviously frustrated about this currently. 05/10/18-She is here in follow-up evaluation for bilateral lateral malleolar ulcers. She states she has purchased offloading shoes/boots and they will arrive tomorrow. She was asked to bring them in the office at next week's appointment so her provider is aware of product being utilized. She continues to sleep on right or left side, she has been encouraged to sleep on her back. The right lateral malleolus ulcer is precariously close to peri-osteum; will order xray. The left lateral malleolus ulcer is improved. Will switch back to santyl; she will follow up next week. 05/17/18 on evaluation today patient actually appears to be doing very well in regard to her malleolus her ulcers compared to last time I saw them. She does not  seem to have as much in the way of contusion at this point which is great news. With that being said she does continue to have discomfort and I do believe that she is still continuing to benefit from the offloading/pressure reducing boots that were recommended. I think this is the key to trying to get this to heal up completely. 05/24/18 on evaluation today patient actually appears to be doing worse at this point in time unfortunately compared to her last week's evaluation. She is having really no increased pain which is good news unfortunately she does have more maceration in your theme and noted surrounding the right lateral ankle the left lateral ankle is not really is erythematous I do not see signs of the overt cellulitis on that side. Unfortunately the wounds do not seem to have shown any signs of improvement since the last evaluation. She also has significant swelling especially on the right compared to previous some of this may be due to infection however also think that she may be served better while she has these wounds by compression wrapping versus continuing to use the Juxta-Lite for the time being. Especially with the amount of drainage that she is experiencing at this point. No fevers, chills, nausea, or vomiting noted at this time. 05/31/18 on evaluation today patient appears to actually be doing better  in regard to her right lateral lower extremity ulcer specifically on the malleolus region. She has been tolerating the antibiotic without complication. With that being said she still continues to have issues but a little bit of redness although nothing like she what she was experiencing previous. She still continues to pressure to her ankle area she did get the problem on offloading boots unfortunately she will not wear them she states there too uncomfortable and she can't get in and out of the bed. Nonetheless at this point her wounds seem to be continually getting worse which is not what  we want I'm getting somewhat concerned about her progress and how things are going to proceed if we do not intervene in some way shape or form. I therefore had a very lengthy conversation today about offloading yet again and even made a specific suggestion for switching her to a memory foam mattress and even gave the information for a specific one that they could look at getting if it was something that they were interested in considering. She does not want to be considered for a hospital bed air mattress although honestly insurance would not cover it that she does not have any wounds on her trunk. 06/14/18 on evaluation today both wounds over the bilateral lateral malleolus her ulcers appear to be doing better there's no evidence of pressure injury at this point. She did get the foam mattress for her bed and this does seem to have been extremely beneficial for her in my pinion. Her daughter states that she is having difficulty getting out of bed because of how soft it is. The patient also relates this to be. Nonetheless I do feel like she's actually doing better. Unfortunately right after and around the time she was getting the mattress she also sustained a fall when she got up to go pick up the phone and ended up injuring her right elbow she has [address] sut[address] place. We are not caring for this currently although home health is going to be taking the sutures out shortly. Nonetheless this may be something that we need to evaluate going forward. It depends on [name], [name] ([id number]) how well it has or has not healed in the end. She also recently saw an orthopedic specialist for an injection in the right shoulder just before her fall unfortunately the fall seems to have worsened her pain. 06/21/18 on evaluation today patient appears to be doing about the same in regard to her lateral malleolus ulcers. Both appear to be just a little bit deeper but again we are clinging away the necrotic and dead  tissue which I think is why this is progressing towards a deeper realm as opposed to improving from my measurement standpoint in that regard. Nonetheless she has been tolerating the dressing changes she absolutely hates the memory foam mattress topper that was obtained for her nonetheless I do believe this is still doing excellent as far as taking care of excess pressure in regard to the lateral malleolus regions. She in fact has no pressure injury that I see whereas in weeks past it was week by week I was constantly seeing new pressure injuries. Overall I think it has been very beneficial for her. 07/03/18; patient arrives in my clinic today. She has deep punched out areas over her bilateral lateral malleoli. The area on the right has some more depth. We spent a lot of time today talking about pressure relief for these areas. This started when her daughter  asked for a prescription for a memory foam mattress. I have never written a prescription for a mattress and I don't think insurances would pay for that on an ordinary bed. In any case he came up that she has foam boots that she refuses to wear. I would suggest going to these before any other offloading issues when she is in bed. They say she is meticulous about offloading this the rest of the day 07/10/18- She is seen in follow-up evaluation for bilateral, lateral malleolus ulcers. There is no improvement in the ulcers. She has purchased and is sleeping on a memory foam mattress/overlay, she has been using the offloading boots nightly over the past week. She has a follow up appointment with vascular medicine at the end of October, in my opinion this follow up should be expedited given her deterioration and suboptimal TBI results. We will order plain film xray of the left ankle as deeper structures are palpable; would consider having MRI, regardless of xray report(s). The ulcers will be treated with iodoflex/iodosorb, she is unable to safely change the  dressings daily with santyl. 07/19/18 on evaluation today patient appears to be doing in general visually well in regard to her bilateral lateral malleolus ulcers. She has been tolerating the dressing changes without complication which is good news. With that being said we did have an x-ray performed on 07/12/18 which revealed a slight loosen see in the lateral portion of the distal left fibula which may represent artifact but underline lytic destruction or osteomyelitis could not be excluded. MRI was recommended. With that being said we can see about getting the patient scheduled for an MRI to further evaluate this area. In fact we have that scheduled currently for August 20 19,019. 07/26/18 on evaluation today patient's wound on the right lateral ankle actually appears to be doing fairly well at this point in my pinion. She has made some good progress currently. With that being said unfortunately in regard to the left lateral ankle ulcer this seems to be a little bit more problematic at this time. In fact as I further evaluated the situation she actually had bone exposed which is the first time that's been the case in the bone appear to be necrotic. Currently I did review patient's note from Dr. Bunnie Domino office with [facility]. He stated that ABI was 1.26 on the right and 0.95 on the left with good waveforms. Her perfusion is stable not reduced from previous studies and her digital waveforms were pretty good particularly on the right. His conclusion upon review of the note was that there was not much she could do to improve her perfusion and he felt she was adequate for wound healing. His suggestion was that she continued to see [place] and consider a synthetic skin graft if there was no underlying infection. He plans to see her back in six months or as needed. 08/01/18 on evaluation today patient appears to be doing better in regard to her right lateral ankle ulcer. Her left lateral  ankle ulcer is about the same she still has bone involvement in evidence of necrosis. There does not appear to be evidence of infection at this time On the right lateral lower extremity. I have started her on the Augmentin she picked this up and started this yesterday. This is to get her through until she sees infectious disease which is scheduled for 08/12/18. 08/06/18 on evaluation today patient appears to be doing rather well considering my discussion with  patient's daughter at the end of last week. The area which was marked where she had erythema seems to be improved and this is good news. With that being said overall the patient seems to be making good improvement when it comes to the overall appearance of the right lateral ankle ulcer although this has been slow she at least is coming around in this regard. Unfortunately in regard to the left lateral ankle ulcer this is osteomyelitis based on the pathology report as well is bone culture. Nonetheless we are still waiting CT scan. Unfortunately the MRI we originally ordered cannot be performed as the patient is a pacemaker which I had overlooked. Nonetheless we are working on the CT scan approval and scheduling as of now. She did go to the hospital over the weekend and was placed on IV Cefzo for a couple of days. Fortunately this seems to have improved the erythema quite significantly which is good news. There does not appear to be any evidence of worsening infection at this time. She did have some bleeding after the last debridement therefore I did not perform any sharp debridement in regard to left lateral ankle at this point. Patient has been approved for a snap vac for the right lateral ankle. 08/14/18; the patient with wounds over her bilateral lateral malleoli. The area on the right actually looks quite good. Been using a snap back on this area. Healthy granulation and appears to be filling in. Unfortunately the area on the left is really  problematic. 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 Also noted to have cellulitis. She saw Dr. Novella Olive of infectious disease today and was put on linezolid. We are able to verify this with her pharmacy. She is completed the Augmentin that she was already on. We've been using Iodoflex to this area 08/23/18 on evaluation today patient's wounds both actually appear to be doing better compared to my prior evaluations. Fortunately she showing signs of good improvement in regard to the overall wound status especially where were using the snap vac on the right. In regard to left lateral malleolus the wound bed actually appears to be much cleaner than previously noted. I do not feel any phone directly probed during evaluation today and though there is tendon noted this does not appear to be necrotic it's actually fairly good as far as the overall appearance of the tendon is concerned. In general the wound bed actually appears to be doing significantly better than it was previous. Patient is currently in the care of [name] and I did review that note today. He actually has her on two weeks of linezolid and then following the patient will be on 1-2 months of Keflex. That is the plan currently. She has been on antibiotics therapy as prescribed by myself initially starting on July 30, 2018 and has been on that continuously up to this point. 08/30/18 on evaluation today patient actually appears to be doing much better in regard to her right lateral malleolus ulcer. She has been tolerating the dressing changes specifically the snap vac without complication although she did have some issues with the seal currently. Apparently there was some trouble with getting it to maintain over the past week past Sunday. Nonetheless overall the wound appears better in regard to the right lateral malleolus region. In regard to left lateral  malleolus this actually show some signs of additional granulation although there still tendon  noted in the base of the wound this appears to be healthy not necrotic in any way whatsoever. We are considering potentially using a snap vac for the left lateral malleolus as well the product wrap from [employer], [place], was present in the clinic today we're going to see this patient I did have her come in with me after obtaining consent from the patient and her daughter in order to look at the wound and see if there's any recommendation one way or another as to whether or not they felt the snapback could be beneficial for the left lateral malleolus region. But [name], [name] ([id number]) the conclusion was that it might be but that this is definitely a little bit deeper wound than what traditionally would be utilized for a snap vac. 09/06/18 on evaluation today patient actually appears to be doing excellent in my pinion in regard to both ankle ulcers. She has been tolerating the dressing changes without complication which is great news. Specifically we have been using the snap vac. In regard to the right ankle I'm not even sure that this is going to be necessary for today and following as the wound has filled in quite nicely. In regard to the left ankle I do believe that we're seeing excellent epithelialization from the edge as well as granulation in the central portion the tendon is still exposed but there's no evidence of necrotic bone and in general I feel like the patient has made excellent progress even compared to last week with just one week of the snap vac. 09/11/18; this is a patient who has wounds on her bilateral lateral malleoli. Initially both of these were deep stage IV wounds in the setting of chronic arterial insufficiency. She has been revascularized. As I understand think she been using snap vacs to both of these wounds however the area on the right became more superficial and currently she  is only using it on the left. Using silver collagen on the right and silver collagen under the back on the left I believe 09/19/18 on evaluation today patient actually appears to be doing very well in regard to her lateral malleolus or ulcers bilaterally. She has been tolerating the dressing changes without complication. Fortunately there does not appear to be any evidence of infection at this time. Overall I feel like she is improving in an excellent manner and I'm very pleased with the fact that everything seems to be turning towards the better for her. This has obviously been a long road. 09/27/18 on evaluation today patient actually appears to be doing very well in regard to her bilateral lateral malleolus ulcers. She has been tolerating the dressing changes without complication. Fortunately there does not appear to be any evidence of infection at this time which is also great news. No fevers, chills, nausea, or vomiting noted at this time. Overall I feel like she is doing excellent with the snap vac on the left malleolus. She had 40 mL of fluid collection over the past week. 10/04/18 on evaluation today patient actually appears to be doing well in regard to her bilateral lateral malleolus ulcers. She continues to tolerate the dressing changes without complication. One issue that I see is the snap vac on the left lateral malleolus which appears to have sealed off some fluid underlying this area and has not really allowed it to heal to the degree that I would like to see. For that reason I did suggest at this point we may want to pack  a small piece of packing strip into this region to allow it to more effectively wick out fluid. 10/11/18 in general the patient today does not feel that she has been doing very well. She's been a little bit lethargic and subsequently is having bodyaches as well according to what she tells me today. With that being said overall she has been concerned with the fact that  something may be worsening although to be honest her wounds really have not been appearing poorly. She does have a new ulcer on her left heel unfortunately. This may be pressure related. Nonetheless it seems to me to have potentially started at least as a blister I do not see any evidence of deep tissue injury. In regard to the left ankle the snap vac still seems to be causing the ceiling off of the deeper part of the wound which is in turn trapping fluid. I'm not extremely pleased with the overall appearance as far as progress from last week to this week therefore I'm gonna discontinue the snap vac at this point. 10/18/18 patient unfortunately this point has not been feeling well for the past several days. She was seen by Grayland Ormond her primary care provider who is a Librarian, academic at [facility]. Subsequently she states that she's been very weak and generally feeling malaise. No fevers, chills, nausea, or vomiting noted at this time. With that being said bloodwork was performed at the PCP office on the 11th of this month which showed a white blood cell count of 10.7. This was repeated today and shows a white blood cell count of 12.4. This does show signs of worsening. Coupled with the fact that she is feeling worse and that her left ankle wound is not really showing signs of improvement I feel like this is an indication that the osteomyelitis is likely exacerbating not improving. Overall I think we may also want to check her C-reactive protein and sedimentation rate. Actually did call [facility] office this afternoon while the patient was in the office here with me. Subsequently based on the findings we discussed treatment possibilities and I think that it is appropriate for [place] to go ahead and initiate treatment with doxycycline which I'm going to do. Subsequently he did agree to see about adding a CRP and sedimentation rate to her orders. If that has not already been drawn  to where they can run it they will contact the patient she can come back to have that check. They are in agreement with plan as far as the patient and her daughter are concerned. Nonetheless also think we need to get in touch with Dr. Henreitta Leber office to see about getting the patient scheduled with him as soon as possible. 11/08/18 on evaluation today patient presents for follow-up concerning her bilateral foot and ankle ulcers. I did do an extensive review of her chart in epic today. Subsequently she was seen by [name] he did initiate Cefepime IV antibiotic therapy. Subsequently she had some issues with her PICC line this had to be removed because it was coiled and then replaced. Fortunately that was now settled. Unfortunately she has continued have issues with her left heel as well as the issues that she is experiencing with her bilateral lateral malleolus regions. I do believe however both areas seem to be doing a little bit better on evaluation today which is good news. No fevers, chills, nausea, or vomiting noted at this time. She actually has an angiogram schedule with Dr. dew  on this coming Monday, November 11, 2018. Subsequently the patient states that she is feeling much better especially than what she was roughly 2 weeks ago. She actually had to cancel an appointment because she was feeling so poorly. No fevers, chills, nausea, or vomiting noted at this time. 11/15/18 on evaluation today patient actually is status post having had her angiogram with Dr. dew Monday, four days ago. 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 He had to go and work on several areas of the vasculature fortunately he was able to obtain no more than a 30% residual stenosis throughout post procedure. I reviewed this note today. I think this will definitely help with healing at this time. Fortunately there does not appear to be any signs of infection and I do feel like ratio already has a  better appearance to it. 11/22/18 upon evaluation today patient actually appears to be doing very well in regard to her wounds in general. The right lateral malleolus looks excellent the heel looks better in the left lateral malleolus also appears to be doing a little better. With that being said the right second toe actually appears to be open and training we been watching this is been dry and stable but now is open. 12/03/2018 Seen today for follow-up and management of multiple bilateral lower extremity wounds. New pressure injury of the great toe which is closed at this time. Wound of the right distal second toe appears larger today with deep undermining and a pocket of fluid present within the undermining region. Left and right malleolus is wounds are stable today with no signs and symptoms of infection.Denies any needs or concerns during exam today. 12/13/18 on evaluation today patient appears to be doing somewhat better in regard to her left heel ulcer. She also seems to be completely healed in regard to the right lateral malleolus ulcer. The left malleolus ulcer is smaller what unfortunately the wounds which are new over the first and second toes of the right foot are what are most concerning at this point especially the second. Both areas did require sharp debridement today. 12/20/18 on evaluation today patient's wound actually appears to be doing better in regard to left lateral ankle and her right lateral ankle continues to remain healed. The hill ulcer on the left is improved. She does have improvement noted as well in regard to both toe ulcers. Overall I'm very pleased in this regard. No fevers, chills, nausea, or vomiting noted at this time. RUBYLEE, ZAMARRIPA ([id number]) 12/23/18 on evaluation today patient is seen after she had her toenails trimmed at the podiatrist office due to issues with her right great toe. There was what appeared to be dark eschar on the surface of the wound which  had her in the podiatrist concerned. Nonetheless as I remember that during the last office visit I had utilize silver nitrate of this area I was much less concerned about the situation. Subsequently I was able to clean off much of this tissue without any complication today. This does not appear to show any signs of infection and actually look somewhat better compared to last time post debridement. Her second toe on the right foot actually had callous over and there did appear still be some fluid underneath this that would require debridement today. 12/27/18 on evaluation today patient actually appears to be showing signs of improvement at all locations. Even the left lateral ankle although this is not quite as great  as the other sites. Fortunately there does not appear to be any signs of infection at this time and both of her toes on the right foot seem to be showing signs of improvement which is good news and very pleased in this regard. 01/03/19 on evaluation today patient appears to be doing better for the most part in regard to her wounds in particular. There does not appear to be any evidence of infection at this time which is good news. Fortunately there is no sign of really worsening anywhere except for the right great toe which she does have what appears to be a bruise/deep tissue injury which is very superficial and already resolving. I'm not sure where this came from I questioned her extensively and she does not recall what may have happened with this. Other than that the patient seems to be doing well even the left lateral ankle ulcer looks good and is getting smaller. 01/10/19 on evaluation today patient appears to be doing well in regard to her left heel wound and both of her toe wounds. Overall I feel like there is definitely improvement here and I'm happy in that regard. With that being said unfortunately she is having issues with the left lateral malleolus ulcer which unfortunately still  has a lot of depth to it. This is gonna be a very difficult wound for [place] to be able to truly get to heal. I may want to consider some type of skin substitute to see if this would be of benefit for her. I'll discuss this with her more the next visit most likely. This was something I thought about more at the end of the visit when I was Artie out of the room and the patient had been discharged. 01/17/19 on evaluation today patient appears to be doing very well in regard to her wounds in general. She's been making excellent progress at this time. Fortunately there's no sign of infection at this time either. No fevers, chills, nausea, or vomiting noted at this time. The biggest issue is still her left lateral malleolus where it appears to be doing well and is getting smaller but still shows a small corner where this is deeper and goes down into what appears to be the joint space. Nonetheless this is taking much longer to heal although it still looks better in smaller than previous evaluations. 01/24/19 on evaluation today patient's wounds actually appear to be doing rather well in general overall. She did require some sharp debridement in regard to the right great toe but everything else appears to be doing excellent no debridement was even necessary. No fevers, chills, nausea, or vomiting noted at this time. 01/31/19 on evaluation today patient actually appears to be doing much better in regard to her left foot wound on the heel as well as the ankle. The right great toe appears to be a little bit worse today this had callous over and trapped a lot of fluid underneath. Fortunately there's no signs of infection at any site which is great news. 02/07/19 on evaluation today patient actually appears to be doing decently well in regard to all of her ulcers at this point. No sharp debridement was required she is a little bit of hyper granulation in regard to the left lateral ankle as well as the left heel but the  hill itself is almost completely healed which is excellent news. Overall been very pleased in this regard. 02/14/19 on evaluation today patient actually appears to be doing very well  in regard to her ulcers on the right first toe, left lateral malleolus, and left heel. In fact the heel is almost completely healed at this point. The patient does not show any signs of infection which is good news. Overall very pleased with how things have progressed. 04/18/19 Telehealth Evaluation During the COVID-19 National Emergency: Verbal Consent: Obtained from patient Allergies: reviewed and the active list is current. Medication changes: patient has no current medication changes. COVID-19 Screening: 1. Have you traveled internationally or on a cruise ship in the last 14 dayso No 2. Have you had contact with someone with or under investigation for COVID-19o No 3. Have you had a fever, cough, sore throat, or experiencing shortness of breatho No on evaluation today actually did have a visit with this patient through a telehealth encounter with her home health nurse. Subsequently it was noted that the patient actually appears to be doing okay in regard to her wounds both the right great toe as well as the left lateral malleolus have shown signs of improvement although this in your theme around the left lateral malleolus there eschar coverings for both locations. The question is whether or not they are actually close and whether or not home health needs to discharge the patient or not. Nonetheless my concern is this point obviously is that without actually seeing her and being able to evaluate this directly I cannot ensure that she is completely healed which is the question that I'm being asked. 04/22/19 on evaluation today patient presents for her first evaluation since last time I saw her which was actually February 14, 2019. I did do a telehealth visit last week in which point it was questionable whether or not she  may be healed and had to bring her in today for confirmation. With that being said she does seem to be doing quite well at this point which is good news. There does not appear to be any drainage in the deed I believe her wounds may be healed. Readmission: 09/04/2019 on evaluation today patient appears to be doing unfortunately somewhat more poorly in regard to her left foot ulcer secondary to a wound that began on 08/21/2019 at least when she first noticed this. Fortunately she has not had any evidence of active infection at this time. Systemically. I also do not necessarily see any evidence of infection at the blister/wound site on the first metatarsal head plantar aspect. This almost appears to be something that may have just rubbed inappropriately causing this to breakdown. They did not want a wait too long to come in to be seen as again she had significant issues in the past with wounds that took quite a while to heal in fact it was close to 2 years. Nonetheless this does not appear to be quite that bad but again we do need to remove some of the necrotic tissue from the surface of the wound to tell exactly the extent. She does not appear to have any significant arterial disease at this point and again her last ABIs and TBI's are recorded above in the alert section her left ABI was 1.27 with a TBI of 0.72 to the right ABI 1.08 with a TBI of 0.39. Other than this the patient has been doing quite [name], [name] ([id number]) well since I last saw her and that was in May 2020. 09/11/2019 on evaluation today patient appeared to be doing very well with regard to her plantar foot ulcer on the left. In fact this  appears to be almost completely healed which is awesome. That is after just 1 week of intervention. With that being said there is no signs of active infection at this time. 09/18/2019 on evaluation today patient actually appears to be doing excellent in fact she is completely healed based on  what I am seeing at this point. Fortunately there is no signs of active infection at this time and overall patient is very pleased to hear that this area has healed so quickly. Readmission: 05/13/2021 upon evaluation today this patient presents for reevaluation here in the clinic. This is a wound that actually we previously took care of. She had 1 on the right ankle and the left the left turned out to be be harder due to to heal but nonetheless is doing great at this point as the right that has reopened and it was noted first just several weeks ago with a scab over it and came off in just the past few days. Fortunately there does not appear to be any obvious evidence of significant active infection at this time which is great news. No fevers, chills, nausea, vomiting, or diarrhea. 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 There does not appear to be any signs of this interfering in any way with her wound. She does have swelling we previously had compression socks for her ordered but again it does not look like she wears these on a regular basis by any means. 05/26/2021 upon evaluation today patient appears to be doing well with regard to her wound which is actually showing signs of excellent improvement. There does not appear to be any signs of active infection which is great news and overall very pleased with where things stand today. No fevers, chills, nausea, vomiting, or diarrhea. 06/02/2021 upon evaluation today patient's wound actually showing signs of excellent improvement. Fortunately there does not appear to be any signs of active infection which is great news. I think the patient is making good progress with regard to her wounds in general. 06/09/2021 upon evaluation today patient appears to be doing excellent in regard to her wounds currently. Fortunately there does not appear to be any signs of active infection which is great news. No fevers,  chills, nausea, vomiting, or diarrhea. Overall extremely pleased with where things stand today. I think the patient is making excellent progress. 06/16/2021 upon evaluation today patient appears to be doing well in regard to her wound. This is going require little bit of debridement today and that was discussed with the patient. Otherwise she seems to be doing quite well and I am actually very pleased with where things stand at this point. No fevers, chills, nausea, vomiting, or diarrhea. 06/23/2021 upon evaluation today patient appears to be doing well with regard to her wounds. She has been tolerating the dressing changes without complication. Fortunately there does not appear to be any evidence of infection and she has not had air in her home which she actually lives at an assisted living that got fixed this morning. With that being said because of that her wrap has been extremely hot and bothersome for her over the past week. 06/30/2021 upon evaluation today patient is actually making excellent progress in regard to her ankle ulcer. She has been tolerating the dressing changes without complication and overall extremely pleased with where things stand there does not appear to be any evidence of active infection which is great news. No fevers, chills, nausea,  vomiting, or diarrhea. 07/07/21 upon evaluation today patients and culture on the right actually appears to be doing quite well. There does not appear to be any signs of infection and overall very pleased with where things stand today. No fevers, chills, nausea, or vomiting noted at this time. 07/14/2021 unfortunately the patient today has some evidence of deep tissue injury and pressure getting to the ankle region. Again I am not exactly sure what is going on here but this is very similar to issues that we have had in the past. I explained to the patient that she needs to be very mindful of exactly what is happening I think sleeping in bed is  probably the main issue here although there could be other culprits I am not sure what else would potentially lead to this kind of a problem for her. 07/21/2021 upon evaluation today patient's wound actually showing signs of improvement compared to last week. Fortunately there does not appear to be any signs of active infection which is great news and overall very pleased with where things stand in that regard. With that being said I do believe that she is continuing to show signs of overall of getting better although I think this is still basically about what we were 2 weeks ago due to the worsening and now improvement. 07/28/2021 upon evaluation today patient appears to be doing well with regard to her wound. She does have some slough buildup on the surface of the wound which I would have to manage today. Fortunately there is no sign of active infection at this time. No fevers, chills, nausea, vomiting, or diarrhea. 08/04/2021 upon evaluation today patient appears to be doing about the same in regard to her wound. To be perfectly honest I am beginning to be a little bit concerned about the overall appearance of the wound bed. I do think possibly taking a sample right around the margin of the wound could be beneficial for her as far as identifying anything such as an inflammatory process or to be honest even a skin tag cancer type process that may be of concern here. Fortunately there does not appear to be any evidence of active infection at this time which is great news she is not having any pain also great news. 08/11/2021 upon evaluation today patient appears to be doing well with regard to her wound. The good news is I did review her biopsy results and it showed some inflammatory mixed findings but nothing that appeared to be malignant which is great news. Overall this is more of a chronic venous stasis type issue which again is more what we have been treating. Nonetheless I just wanted to make sure  before going forward that there was not anything more untoward going on at this point. 08/18/2021 upon evaluation today patient appears to be doing well with regard to her ankle ulcer. Fortunately there does not appear to be any signs of active infection at this time which is great overall wound is dramatically improved compared to last week. Since last week I have actually placed her on doxycycline and subsequently this is a good option as far as the findings are concerned at this point. 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 the doxycycline is doing that. There does not appear to be any evidence of active infection systemically which is great news.  08/25/2021 upon evaluation today patient appears to be doing well with regard to her wound. I feel like we are finally get back on track as far as healing is concerned I am much happier with the overall appearance today. I do think that she is tolerating the dressing changes without complication which is great news. We have been using Hydrofera Blue which I think is a good option. The good news is she is also doing great in [place], TARALEE MARCUS. ([id number]) regard to her compression sock on the left which is a zipper compression that seems to be doing a great job keeping her edema under good control. 09/01/2021 upon evaluation today patient appears to be doing well with regard to her wound. Infection seems to be under much better control which is great news and very pleased in that regard. Fortunately there does not appear to be any signs of infection currently. 09/08/2021 upon evaluation today patient actually appears to be making good progress in regard to her wound. She has been tolerating the dressing changes without complication. Fortunately there does not appear to be any evidence of active infection at this time which is  great news as well. No fevers, chills, nausea, vomiting, or diarrhea. 09/22/2021 upon evaluation today patient appears to be doing well with regard to her wound although is very slow to heal. We have not looked into Apligraf yet I think that is something that we should see about doing. 09/29/2021 upon evaluation today patient's wound is actually showing signs of doing about the same. I am not seeing any evidence of worsening but also no significant evidence of improvement. We did gain approval for the organogenesis products all except for Apligraf as covered by her insurance. With that being said I do think that we can go ahead and proceed with the NuShield if the patient and her family in agreement of the plan I discussed that with him today she does have a 20% coinsurance which we also discussed. 10/06/2021 upon evaluation today patient appears to unfortunately be doing a little bit worse she appears to be infected based on what I am seeing. Fortunately there does not appear to be any signs of active infection at this time which is great news. Unfortunately it does appear to be some evidence of around the wound edge indicated by way of erythema and warmth as well as redness 10/13/2021 upon evaluation today patient actually appears to be doing excellent in regard to her ankle ulcer compared to what it was. 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 I think were fine to go ahead and apply the NuShield today. 10/20/2021 upon evaluation today patient appears to be doing excellent in fact the NuShield seems to have done all some for her thus far. I am actually very pleased with where we stand and overall I think that she is making great progress. There is no evidence of active infection at this time. 11/23;  patient presents for follow-up. She has no issues or complaints today. She denies signs of infection. She reports stability in her wound healing. 11/03/2021 upon evaluation today patient appears to be doing well with regard to her wounds. She has been tolerating the dressing changes without complication. Fortunately there is no signs of active infection at this time. 11/10/2021 upon evaluation  today patient appears to be doing well with regard to her right ankle which is actually showing signs of improvement with the NuShield I am very pleased. Subsequently in regards to the left ankle this appears to be doing okay with no evidence of issue here either. With that being said she has a significant contusion on the left leg from having fallen 2 days ago. She tells me that she was using her walker going to the closet and then when she got to the closet turned around to get something out at which point she fell according to the story. Nonetheless she does have a hematoma just below her knee on the anterior portion of her shin. My hope is that this will not open until wound although we do need I think Some compression over it also think that we need to have her use an ice as well to help with the swelling and prevent this from getting worse. The last thing she needs is a wound opened up here. 11/17/2021 upon evaluation patient's right ankle actually showing signs of improvement based on what I see currently there is a lot of new skin growth coming in which is great news. Nonetheless I do feel like that the patient is showing improvement as well in regard to her left leg with the use of the Tubigrip it swollen but not as bruised as what I would have expected after what I was seeing last week. Nonetheless I do think doubling up on the Tubigrip would probably be beneficial to try to keep some of the edema under better control here. 11/24/2021 upon evaluation today patient appears to be doing excellent in regard  to her wound. This is actually measuring significantly smaller which is great news. Fortunately I do not see any signs of active infection locally nor systemically at this point. Overall I am very pleased with how the NuShield is doing. 12/30; wound bed looks healthy however not much change in overall wound volume. We applied Nushield again today in the standard fashion 12/08/2021 upon evaluation today patient's wound actually showing signs of good improvement and I am actually very pleased with where we stand today as well. I do not see any evidence of active infection locally nor systemically which is great news. Unfortunately she has been having some issues with stomach upset and diarrhea today. 12/15/2021 upon evaluation today patient appears to be doing excellent in regard to her wound. There is a little bit of dry skin raised up around the edges of the wound but fortunately nothing too significant at this point. Fortunately I do not see any evidence of active infection either which is great news. No fevers, chills, nausea, vomiting, or diarrhea. Objective Constitutional Well-nourished and well-hydrated in no acute distress. Vitals Time Taken: 2:27 PM, Height: 62 in, Weight: 150 lbs, BMI: 27.4, Temperature: 98 F, Pulse: 79 bpm, Respiratory Rate: 18 breaths/min, Blood Pressure: 156/84 mmHg. [name], [name] ([id number]) Respiratory normal breathing without difficulty. Psychiatric this patient is able to make decisions and demonstrates good insight into disease process. Alert and Oriented x 3. pleasant and cooperative. General Notes: Patient's wound bed did require little bit of sharp debridement which I was able to do today without complication postdebridement wound bed appears to be doing much better which is great news and overall I am extremely pleased with where we stand at this point. Integumentary (Hair, Skin) Wound #7 status is Open. Original cause of wound was Gradually Appeared.  The date acquired was: 05/12/2021.  The wound has been in treatment 30 weeks. The wound is located on the Right,Lateral Malleolus. The wound measures 0.5cm length x 0.5cm width x 0.2cm depth; 0.196cm^2 area and 0.039cm^3 volume. There is Fat Layer (Subcutaneous Tissue) exposed. There is no tunneling or undermining noted. There is a medium amount of serosanguineous drainage noted. There is medium (34-66%) red granulation within the wound bed. There is a medium (34-66%) amount of necrotic tissue within the wound bed. Assessment Active Problems ICD-10 Pressure ulcer of right ankle, stage 3 Lymphedema, not elsewhere classified Venous insufficiency (chronic) (peripheral) Presence of cardiac pacemaker Long term (current) use of anticoagulants Procedures Wound #7 Pre-procedure diagnosis of Wound #7 is a Pressure Ulcer located on the Right,Lateral Malleolus . There was a Excisional Skin/Subcutaneous Tissue Debridement with a total area of 0.25 sq cm performed by Tommie Sams., PA-C. With the following instrument(s): Curette to remove Viable and Non-Viable tissue/material. Material removed includes Subcutaneous Tissue, Slough, and Biofilm. No specimens were taken. A time out was conducted at 14:35, prior to the start of the procedure. A Minimum amount of bleeding was controlled with Pressure. The procedure was tolerated well with a pain level of 0 throughout and a pain level of 0 following the procedure. Post Debridement Measurements: 0.5cm length x 0.5cm width x 0.2cm depth; 0.039cm^3 volume. Post debridement Stage noted as Category/Stage III. Character of Wound/Ulcer Post Debridement is improved. Post procedure Diagnosis Wound #7: Same as Pre-Procedure Plan Follow-up Appointments: Return Appointment in 1 week. Bathing/ Shower/ Hygiene: May shower with wound dressing protected with water repellent cover or cast protector. Edema Control - Lymphedema / Segmental Compressive Device / Other: Optional:  One layer of unna paste to top of compression wrap (to act as an anchor). - and at toes Patient to wear own compression stockings. Remove compression stockings every night before going to bed and put on every morning when getting up. - left leg Elevate, Exercise Daily and Avoid Standing for Long Periods of Time. Elevate legs to the level of the heart and pump ankles as often as possible Elevate leg(s) parallel to the floor when sitting. WOUND #7: - Malleolus Wound Laterality: Right, Lateral Cleanser: Soap and Water 1 x Per Week/30 Days Discharge Instructions: Gently cleanse wound with antibacterial soap, rinse and pat dry prior to dressing wounds Primary Dressing: Prisma 4.34 (in) 1 x Per Week/30 Days Discharge Instructions: Moisten w/normal saline or sterile water; Cover wound as directed. Do not remove from wound bed. Secondary Dressing: Gauze 1 x Per Week/30 Days Discharge Instructions: As directed: dry, moistened with saline or moistened with Dakins Solution Val, [name] ([id number]) Compression Wrap: Profore Lite LF 3 Multilayer Compression Bandaging System 1 x Per Week/30 Days Discharge Instructions: Apply 3 multi-layer wrap as prescribed. #1 would recommend that we going to continue with the wound care measures as before and the patient is in agreement with plan. This includes the use of the silver collagen which I think is doing a great job. 2. I am also can recommend that we continue with the 3 layer compression wrap which I think is actually doing an awesome job for her. I think this has really kept the edema under control which has been extremely helpful. We will see patient back for reevaluation in 1 week here in the clinic. If anything worsens or changes patient will contact our office for additional recommendations. Electronic Signature(s) Signed: 12/15/2021 2:42:30 PM By: Worthy Keeler PA-C Entered By: Worthy Keeler on 12/15/2021 14:42:30 Dagley, Hulan Amato  Darnell Level  ([id number]) -------------------------------------------------------------------------------- SuperBill Details Patient Name: CHARRIE, MCCONNON. Date of Service: 12/15/2021 Medical Record Number: [id number] Patient Account Number: [id number] Date of Birth/Sex: 15-Apr-1923 (88 y.o. F) Treating RN: Carlene Coria Primary Care Provider: Adrian Prows Other Clinician: Referring Provider: Adrian Prows Treating Provider/Extender: Skipper Cliche in Treatment: 30 Diagnosis Coding ICD-10 Codes Code Description [phone] Pressure ulcer of right ankle, stage 3 I89.0 Lymphedema, not elsewhere classified I87.2 Venous insufficiency (chronic) (peripheral) Z95.0 Presence of cardiac pacemaker Z79.01 Long term (current) use of anticoagulants Facility Procedures CPT4 Code: 77824235 Description: 36144 - DEB SUBQ TISSUE 20 SQ CM/< Modifier: Quantity: 1 CPT4 Code: Description: ICD-10 Diagnosis Description 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, stage 3 Modifier: Quantity: Physician Procedures CPT4 Code: 3154008 Description: 67619 - WC PHYS SUBQ TISS 20 SQ CM Modifier: Quantity: 1 CPT4 Code: Description: ICD-10 Diagnosis Description 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, stage 3 Modifier: Quantity: Electronic Signature(s) Signed: 12/15/2021 2:42:46 PM By: Worthy Keeler PA-C Entered By: Worthy Keeler on 12/15/2021 14:42:45

## 2021-12-15 NOTE — Progress Notes (Addendum)
MARIEELENA, BARTKO ([id number]) Visit Report for 12/15/2021 Arrival Information Details Patient Name: JENILYN, MAGANA Date of Service: 12/15/2021 2:00 PM Medical Record Number: [id number] Patient Account Number: [id number] Date of Birth/Sex: 1923/04/21 (88 y.o. F) Treating RN: Carlene Coria Primary Care Trea Latner: Adrian Prows Other Clinician: Referring Amaro Mangold: Adrian Prows Treating Taaj Hurlbut/Extender: Skipper Cliche in Treatment: 66 Visit Information History Since Last Visit All ordered tests and consults were completed: No Patient Arrived: [name] Added or deleted any medications: No Arrival Time: 14:12 Any new allergies or adverse reactions: No Accompanied By: daughter Had a fall or experienced change in No Transfer Assistance: None activities of daily living that may affect Patient Identification Verified: Yes risk of falls: Secondary Verification Process Completed: Yes Signs or symptoms of abuse/neglect since last visito No Patient Requires Transmission-Based No Hospitalized since last visit: No Precautions: Implantable device outside of the clinic excluding No Patient Has Alerts: Yes cellular tissue based products placed in the center Patient Alerts: Patient on Blood since last visit: Thinner Has Dressing in Place as Prescribed: Yes NOT DIABETIC Has Compression in Place as Prescribed: Yes ***ELIQUIS*** Pain Present Now: No Electronic Signature(s) Signed: 12/16/2021 4:26:12 PM By: Carlene Coria RN Entered By: Carlene Coria on 12/15/2021 14:27:27 Muzio, [name] ([id number]) -------------------------------------------------------------------------------- Clinic Level of Care Assessment Details Patient Name: [name] Date of Service: 12/15/2021 2:00 PM Medical Record Number: [id number] Patient Account Number: [id number] Date of Birth/Sex: 25-Sep-1923 (88 y.o. F) Treating RN: Carlene Coria Primary Care Jaxan Michel: Adrian Prows Other  Clinician: Referring Dua Mehler: Adrian Prows Treating Cerina Leary/Extender: Skipper Cliche in Treatment: 30 Clinic Level of Care Assessment Items TOOL 1 Quantity Score []  - Use when EandM and Procedure is performed on INITIAL visit 0 ASSESSMENTS - Nursing Assessment / Reassessment []  - General Physical Exam (combine w/ comprehensive assessment (listed just below) when performed on new 0 pt. evals) []  - 0 Comprehensive Assessment (HX, ROS, Risk Assessments, Wounds Hx, etc.) ASSESSMENTS - Wound and Skin Assessment / Reassessment []  - Dermatologic / Skin Assessment (not related to wound area) 0 ASSESSMENTS - Ostomy and/or Continence Assessment and Care []  - Incontinence Assessment and Management 0 []  - 0 Ostomy Care Assessment and Management (repouching, etc.) PROCESS - Coordination of Care []  - Simple Patient / Family Education for ongoing care 0 []  - 0 Complex (extensive) Patient / Family Education for ongoing care []  - 0 Staff obtains Programmer, systems, Records, Test Results / Process Orders []  - 0 Staff telephones HHA, Nursing Homes / Clarify orders / etc []  - 0 Routine Transfer to another Facility (non-emergent condition) []  - 0 Routine Hospital Admission (non-emergent condition) []  - 0 New Admissions / Biomedical engineer / Ordering NPWT, Apligraf, etc. []  - 0 Emergency Hospital Admission (emergent condition) PROCESS - Special Needs []  - Pediatric / Minor Patient Management 0 []  - 0 Isolation Patient Management []  - 0 Hearing / Language / Visual special needs []  - 0 Assessment of Community assistance (transportation, D/C planning, etc.) []  - 0 Additional assistance / Altered mentation []  - 0 Support Surface(s) Assessment (bed, cushion, seat, etc.) INTERVENTIONS - Miscellaneous []  - External ear exam 0 []  - 0 Patient Transfer (multiple staff / Civil Service fast streamer / Similar devices) []  - 0 Simple Staple / Suture removal (25 or less) []  - 0 Complex Staple / Suture removal  (26 or more) []  - 0 Hypo/Hyperglycemic Management (do not check if billed separately) []  - 0 Ankle / Brachial Index (ABI) - do not check if billed separately Has the  patient been seen at the hospital within the last three years: Yes Total Score: 0 Level Of Care: ____ [name] ([id number]) Electronic Signature(s) Signed: 12/16/2021 4:26:12 PM By: Carlene Coria RN Entered By: Carlene Coria on 12/15/2021 14:38:14 Angelillo, [name] ([id number]) -------------------------------------------------------------------------------- Encounter Discharge Information Details Patient Name: [name] Date of Service: 12/15/2021 2:00 PM Medical Record Number: [id number] Patient Account Number: [id number] Date of Birth/Sex: [birth year] (88 y.o. F) Treating RN: Carlene Coria Primary Care Kharon Hixon: Adrian Prows Other Clinician: Referring Quentez Lober: Adrian Prows Treating Guled Gahan/Extender: Skipper Cliche in Treatment: 30 Encounter Discharge Information Items Post Procedure Vitals Discharge Condition: Stable Temperature (F): 98 Ambulatory Status: Walker Pulse (bpm): 79 Discharge Destination: Home Respiratory Rate (breaths/min): 18 Transportation: Private Auto Blood Pressure (mmHg): 156/84 Accompanied By: daughter Schedule Follow-up Appointment: Yes Clinical Summary of Care: Patient Declined Electronic Signature(s) Signed: 12/16/2021 4:26:12 PM By: Carlene Coria RN Entered By: Carlene Coria on 12/15/2021 14:39:32 Kuehl, [name] ([id number]) -------------------------------------------------------------------------------- Lower Extremity Assessment Details Patient Name: [name] Date of Service: 12/15/2021 2:00 PM Medical Record Number: [id number] Patient Account Number: [id number] Date of Birth/Sex: 09/20/1923 (88 y.o. F) Treating RN: Carlene Coria Primary Care Halsey Hammen: Adrian Prows Other Clinician: Referring Ronda Kazmi: Adrian Prows Treating  Charrisse Masley/Extender: Skipper Cliche in Treatment: 30 Edema Assessment Assessed: [Left: No] Patrice Paradise: No] [Left: Edema] [Right: :] Calf Left: Right: Point of Measurement: 33 cm From Medial Instep 27 cm Ankle Left: Right: Point of Measurement: 10 cm From Medial Instep 18 cm Vascular Assessment Pulses: Dorsalis Pedis Palpable: [Right:Yes] Electronic Signature(s) Signed: 12/16/2021 4:26:12 PM By: Carlene Coria RN Entered By: Carlene Coria on 12/15/2021 14:30:15 Danielski, [name] ([id number]) -------------------------------------------------------------------------------- Multi Wound Chart Details Patient Name: [name] Date of Service: 12/15/2021 2:00 PM Medical Record Number: [id number] Patient Account Number: [id number] Date of Birth/Sex: 03-Oct-1923 (88 y.o. F) Treating RN: Carlene Coria Primary Care Ritha Sampedro: Adrian Prows Other Clinician: Referring Ayan Yankey: Adrian Prows Treating Marquavion Venhuizen/Extender: Skipper Cliche in Treatment: 30 Vital Signs Height(in): 62 Pulse(bpm): 39 Weight(lbs): 150 Blood Pressure(mmHg): 156/84 Body Mass Index(BMI): 27 Temperature(F): 98 Respiratory Rate(breaths/min): 18 Photos: [N/A:N/A] Wound Location: Right, Lateral Malleolus N/A N/A Wounding Event: Gradually Appeared N/A N/A Primary Etiology: Pressure Ulcer N/A N/A Comorbid History: Cataracts, Congestive Heart Failure, N/A N/A Hypertension, Peripheral Arterial Disease, Osteoarthritis, Neuropathy Date Acquired: 05/12/2021 N/A N/A Weeks of Treatment: 30 N/A N/A Wound Status: Open N/A N/A Measurements L x W x D (cm) 0.5x0.5x0.2 N/A N/A Area (cm) : 0.196 N/A N/A Volume (cm) : 0.039 N/A N/A % Reduction in Area: 82.50% N/A N/A % Reduction in Volume: 82.70% N/A N/A Classification: Category/Stage III N/A N/A Exudate Amount: Medium N/A N/A Exudate Type: Serosanguineous N/A N/A Exudate Color: red, brown N/A N/A Granulation Amount: Medium (34-66%) N/A N/A Granulation Quality:  Red N/A N/A Necrotic Amount: Medium (34-66%) N/A N/A Exposed Structures: Fat Layer (Subcutaneous Tissue): N/A N/A Yes Fascia: No Tendon: No Muscle: No Joint: No Bone: No Epithelialization: None N/A N/A Treatment Notes Electronic Signature(s) Signed: 12/16/2021 4:26:12 PM By: Carlene Coria RN Entered By: Carlene Coria on 12/15/2021 14:34:35 Goulette, [name] ([id number]) -------------------------------------------------------------------------------- [place] Details Patient Name: [name] Date of Service: 12/15/2021 2:00 PM Medical Record Number: [id number] Patient Account Number: [id number] Date of Birth/Sex: January 23, 1923 (88 y.o. F) Treating RN: Carlene Coria Primary Care Pranit Owensby: Adrian Prows Other Clinician: Referring Michaeal Davis: Adrian Prows Treating Pattijo Juste/Extender: Skipper Cliche in Treatment: 30 Active Inactive Electronic Signature(s) Signed: 12/16/2021 4:26:12 PM By: Carlene Coria RN Entered By: Dolores Lory  Carrie on 12/15/2021 14:34:12 Perno, [name] ([id number]) -------------------------------------------------------------------------------- Pain Assessment Details Patient Name: RAMIYA, DELAHUNTY Date of Service: 12/15/2021 2:00 PM Medical Record Number: [id number] Patient Account Number: [id number] Date of Birth/Sex: 09/11/1923 (88 y.o. F) Treating RN: Carlene Coria Primary Care Azaan Leask: Adrian Prows Other Clinician: Referring Halyn Flaugher: Adrian Prows Treating Mandeep Ferch/Extender: Skipper Cliche in Treatment: 30 Active Problems Location of Pain Severity and Description of Pain Patient Has Paino No Site Locations Pain Management and Medication Current Pain Management: Electronic Signature(s) Signed: 12/16/2021 4:26:12 PM By: Carlene Coria RN Entered By: Carlene Coria on 12/15/2021 14:28:08 [name]  ([id number]) -------------------------------------------------------------------------------- Patient/Caregiver Education Details Patient Name: [name] Date of Service: 12/15/2021 2:00 PM Medical Record Number: [id number] Patient Account Number: [id number] Date of Birth/Gender: [birth year] (88 y.o. F) Treating RN: Carlene Coria Primary Care Physician: Adrian Prows Other Clinician: Referring Physician: Adrian Prows Treating Physician/Extender: Skipper Cliche in Treatment: 30 Education Assessment Education Provided To: Patient Education Topics Provided Wound/Skin Impairment: Methods: Explain/Verbal Responses: State content correctly Electronic Signature(s) Signed: 12/16/2021 4:26:12 PM By: Carlene Coria RN Entered By: Carlene Coria on 12/15/2021 14:38:37 Lippe, [name] ([id number]) -------------------------------------------------------------------------------- Wound Assessment Details Patient Name: [name] Date of Service: 12/15/2021 2:00 PM Medical Record Number: [id number] Patient Account Number: [id number] Date of Birth/Sex: 1923/09/28 (88 y.o. F) Treating RN: Carlene Coria Primary Care Sia Gabrielsen: Adrian Prows Other Clinician: Referring Madaline Lefeber: Adrian Prows Treating Etty Isaac/Extender: Skipper Cliche in Treatment: 30 Wound Status Wound Number: 7 Primary Pressure Ulcer Etiology: Wound Location: Right, Lateral Malleolus Wound Open Wounding Event: Gradually Appeared Status: Date Acquired: 05/12/2021 Comorbid Cataracts, Congestive Heart Failure, Hypertension, Weeks Of Treatment: 30 History: Peripheral Arterial Disease, Osteoarthritis, Neuropathy Clustered Wound: No Photos Wound Measurements Length: (cm) 0.5 Width: (cm) 0.5 Depth: (cm) 0.2 Area: (cm) 0.196 Volume: (cm) 0.039 % Reduction in Area: 82.5% % Reduction in Volume: 82.7% Epithelialization: None Tunneling: No Undermining: No Wound Description Classification:  Category/Stage III Exudate Amount: Medium Exudate Type: Serosanguineous Exudate Color: red, brown Foul Odor After Cleansing: No Slough/Fibrino Yes Wound Bed Granulation Amount: Medium (34-66%) Exposed Structure Granulation Quality: Red Fascia Exposed: No Necrotic Amount: Medium (34-66%) Fat Layer (Subcutaneous Tissue) Exposed: Yes Tendon Exposed: No Muscle Exposed: No Joint Exposed: No Bone Exposed: No Treatment Notes Wound #7 (Malleolus) Wound Laterality: Right, Lateral Cleanser Soap and Water Discharge Instruction: Gently cleanse wound with antibacterial soap, rinse and pat dry prior to dressing wounds Peri-Wound Care Bartee, [name] ([id number]) Topical Primary Dressing Prisma 4.34 (in) Discharge Instruction: Moisten w/normal saline or sterile water; Cover wound as directed. Do not remove from wound bed. Secondary Dressing Gauze Discharge Instruction: As directed: dry, moistened with saline or moistened with Dakins Solution Secured With Compression Wrap Profore Lite LF 3 Multilayer Compression Bandaging System Discharge Instruction: Apply 3 multi-layer wrap as prescribed. Compression Stockings Add-Ons Electronic Signature(s) Signed: 12/16/2021 4:26:12 PM By: Carlene Coria RN Entered By: Carlene Coria on 12/15/2021 14:29:15 Tomasetti, [name] ([id number]) -------------------------------------------------------------------------------- Vitals Details Patient Name: [name] Date of Service: 12/15/2021 2:00 PM Medical Record Number: [id number] Patient Account Number: [id number] Date of Birth/Sex: Feb 25, 1923 (88 y.o. F) Treating RN: Carlene Coria Primary Care Dea Bitting: Adrian Prows Other Clinician: Referring Adalei Novell: Adrian Prows Treating Lesslie Mossa/Extender: Skipper Cliche in Treatment: 30 Vital Signs Time Taken: 14:27 Temperature (F): 98 Height (in): 62 Pulse (bpm): 79 Weight (lbs): 150 Respiratory Rate (breaths/min): 18 Body Mass Index  (BMI): 27.4 Blood Pressure (mmHg): 156/84 Reference Range: 80 - 120 mg / dl Electronic Signature(s) Signed: 12/16/2021  4:26:12 PM By: Carlene Coria RN Entered By: Carlene Coria on 12/15/2021 14:27:58

## 2021-12-22 ENCOUNTER — Other Ambulatory Visit: Payer: Self-pay

## 2021-12-22 DIAGNOSIS — L89513 Pressure ulcer of right ankle, stage 3: Secondary | ICD-10-CM | POA: Diagnosis not present

## 2021-12-22 NOTE — Progress Notes (Signed)
CELES, DEDIC ([id number]) Visit Report for 12/22/2021 Physician Orders Details Patient Name: [name], [name]. Date of Service: 12/22/2021 10:15 AM Medical Record Number: [id number] Patient Account Number: [id number] Date of Birth/Sex: 06-Jun-1923 (88 y.o. F) Treating RN: Carlene Coria Primary Care Provider: Adrian Prows Other Clinician: Referring Provider: Adrian Prows Treating Provider/Extender: Skipper Cliche in Treatment: 44 Verbal / Phone Orders: No Diagnosis Coding Follow-up Appointments o Return Appointment in 1 week. Bathing/ Shower/ Hygiene o May shower with wound dressing protected with water repellent cover or cast protector. Edema Control - Lymphedema / Segmental Compressive Device / Other o Optional: One layer of unna paste to top of compression wrap (to act as an anchor). - and at toes o Patient to wear own compression stockings. Remove compression stockings every night before going to bed and put on every morning when getting up. - left leg o Elevate, Exercise Daily and Avoid Standing for Long Periods of Time. o Elevate legs to the level of the heart and pump ankles as often as possible o Elevate leg(s) parallel to the floor when sitting. Wound Treatment Wound #7 - Malleolus Wound Laterality: Right, Lateral Cleanser: Soap and Water 1 x Per Week/30 Days Discharge Instructions: Gently cleanse wound with antibacterial soap, rinse and pat dry prior to dressing wounds Primary Dressing: Prisma 4.34 (in) 1 x Per Week/30 Days Discharge Instructions: Moisten w/normal saline or sterile water; Cover wound as directed. Do not remove from wound bed. Secondary Dressing: Gauze 1 x Per Week/30 Days Discharge Instructions: As directed: dry, moistened with saline or moistened with Dakins Solution Compression Wrap: Profore Lite LF 3 Multilayer Compression Bandaging System 1 x Per Week/30 Days Discharge Instructions: Apply 3 multi-layer wrap as  prescribed. Electronic Signature(s) Signed: 12/22/2021 11:44:23 AM By: Carlene Coria RN Signed: 12/22/2021 2:08:42 PM By: Worthy Keeler PA-C Entered By: Carlene Coria on 12/22/2021 10:46:37 Budzinski, [name] ([id number]) -------------------------------------------------------------------------------- SuperBill Details Patient Name: [name] Date of Service: 12/22/2021 Medical Record Number: [id number] Patient Account Number: [id number] Date of Birth/Sex: December 14, 1922 (88 y.o. F) Treating RN: Carlene Coria Primary Care Provider: Adrian Prows Other Clinician: Referring Provider: Adrian Prows Treating Provider/Extender: Skipper Cliche in Treatment: 31 Diagnosis Coding ICD-10 Codes Code Description [phone] Pressure ulcer of right ankle, stage 3 I89.0 Lymphedema, not elsewhere classified I87.2 Venous insufficiency (chronic) (peripheral) Z95.0 Presence of cardiac pacemaker Z79.01 Long term (current) use of anticoagulants Facility Procedures CPT4 Code: [id number] Description: (Facility Use Only) [phone] - [place] RT LEG Modifier: Quantity: 1 Electronic Signature(s) Signed: 12/22/2021 11:44:23 AM By: Carlene Coria RN Signed: 12/22/2021 2:08:42 PM By: Worthy Keeler PA-C Entered By: Carlene Coria on 12/22/2021 10:47:50

## 2021-12-22 NOTE — Progress Notes (Signed)
BRYNNAN, RODENBAUGH ([id number]) Visit Report for 12/22/2021 Arrival Information Details Patient Name: [name], [name] Date of Service: 12/22/2021 10:15 AM Medical Record Number: [id number] Patient Account Number: [id number] Date of Birth/Sex: 1922/12/15 (88 y.o. F) Treating RN: Carlene Coria Primary Care Rachid Parham: Adrian Prows Other Clinician: Referring Massiah Minjares: Adrian Prows Treating Titiana Severa/Extender: Skipper Cliche in Treatment: 56 Visit Information History Since Last Visit All ordered tests and consults were completed: No Patient Arrived: Ambulatory Added or deleted any medications: No Arrival Time: 10:22 Any new allergies or adverse reactions: No Accompanied By: son Had a fall or experienced change in No Transfer Assistance: None activities of daily living that may affect Patient Identification Verified: Yes risk of falls: Secondary Verification Process Completed: Yes Signs or symptoms of abuse/neglect since last visito No Patient Requires Transmission-Based No Hospitalized since last visit: No Precautions: Implantable device outside of the clinic excluding No Patient Has Alerts: Yes cellular tissue based products placed in the center Patient Alerts: Patient on Blood since last visit: Thinner Has Dressing in Place as Prescribed: Yes NOT DIABETIC Has Compression in Place as Prescribed: Yes ***ELIQUIS*** Pain Present Now: No Electronic Signature(s) Signed: 12/22/2021 11:44:23 AM By: Carlene Coria RN Entered By: Carlene Coria on 12/22/2021 10:23:46 Felipe, [name] ([id number]) -------------------------------------------------------------------------------- Clinic Level of Care Assessment Details Patient Name: [name] Date of Service: 12/22/2021 10:15 AM Medical Record Number: [id number] Patient Account Number: [id number] Date of Birth/Sex: 1923-08-31 (88 y.o. F) Treating RN: Carlene Coria Primary Care Marsheila Alejo: Adrian Prows Other  Clinician: Referring Carnita Golob: Adrian Prows Treating Filomena Pokorney/Extender: Skipper Cliche in Treatment: 31 Clinic Level of Care Assessment Items TOOL 1 Quantity Score []  - Use when EandM and Procedure is performed on INITIAL visit 0 ASSESSMENTS - Nursing Assessment / Reassessment []  - General Physical Exam (combine w/ comprehensive assessment (listed just below) when performed on new 0 pt. evals) []  - 0 Comprehensive Assessment (HX, ROS, Risk Assessments, Wounds Hx, etc.) ASSESSMENTS - Wound and Skin Assessment / Reassessment []  - Dermatologic / Skin Assessment (not related to wound area) 0 ASSESSMENTS - Ostomy and/or Continence Assessment and Care []  - Incontinence Assessment and Management 0 []  - 0 Ostomy Care Assessment and Management (repouching, etc.) PROCESS - Coordination of Care []  - Simple Patient / Family Education for ongoing care 0 []  - 0 Complex (extensive) Patient / Family Education for ongoing care []  - 0 Staff obtains Programmer, systems, Records, Test Results / Process Orders []  - 0 Staff telephones HHA, Nursing Homes / Clarify orders / etc []  - 0 Routine Transfer to another Facility (non-emergent condition) []  - 0 Routine Hospital Admission (non-emergent condition) []  - 0 New Admissions / Biomedical engineer / Ordering NPWT, Apligraf, etc. []  - 0 Emergency Hospital Admission (emergent condition) PROCESS - Special Needs []  - Pediatric / Minor Patient Management 0 []  - 0 Isolation Patient Management []  - 0 Hearing / Language / Visual special needs []  - 0 Assessment of Community assistance (transportation, D/C planning, etc.) []  - 0 Additional assistance / Altered mentation []  - 0 Support Surface(s) Assessment (bed, cushion, seat, etc.) INTERVENTIONS - Miscellaneous []  - External ear exam 0 []  - 0 Patient Transfer (multiple staff / Civil Service fast streamer / Similar devices) []  - 0 Simple Staple / Suture removal (25 or less) []  - 0 Complex Staple / Suture removal  (26 or more) []  - 0 Hypo/Hyperglycemic Management (do not check if billed separately) []  - 0 Ankle / Brachial Index (ABI) - do not check if billed separately Has the  patient been seen at the hospital within the last three years: Yes Total Score: 0 Level Of Care: ____ [name] ([id number]) Electronic Signature(s) Signed: 12/22/2021 11:44:23 AM By: Carlene Coria RN Entered By: Carlene Coria on 12/22/2021 10:47:28 Serres, [name] ([id number]) -------------------------------------------------------------------------------- Compression Therapy Details Patient Name: [name] Date of Service: 12/22/2021 10:15 AM Medical Record Number: [id number] Patient Account Number: [id number] Date of Birth/Sex: [birth year] (88 y.o. F) Treating RN: Carlene Coria Primary Care Angeliyah Kirkey: Adrian Prows Other Clinician: Referring Hadlie Gipson: Adrian Prows Treating Vedant Shehadeh/Extender: Skipper Cliche in Treatment: 31 Compression Therapy Performed for Wound Assessment: Wound #7 Right,Lateral Malleolus Performed By: Jake Church, RN Compression Type: Three Layer Electronic Signature(s) Signed: 12/22/2021 11:44:23 AM By: Carlene Coria RN Entered By: Carlene Coria on 12/22/2021 10:45:59 Beale, [name] ([id number]) -------------------------------------------------------------------------------- Encounter Discharge Information Details Patient Name: [name] Date of Service: 12/22/2021 10:15 AM Medical Record Number: [id number] Patient Account Number: [id number] Date of Birth/Sex: May 13, 1923 (88 y.o. F) Treating RN: Carlene Coria Primary Care Orlandis Sanden: Adrian Prows Other Clinician: Referring Fabiha Rougeau: Adrian Prows Treating Neidra Girvan/Extender: Skipper Cliche in Treatment: 31 Encounter Discharge Information Items Discharge Condition: Stable Ambulatory Status: Walker Discharge Destination: Home Transportation: Private Auto Accompanied By: son Schedule  Follow-up Appointment: Yes Clinical Summary of Care: Patient Declined Electronic Signature(s) Signed: 12/22/2021 11:44:23 AM By: Carlene Coria RN Entered By: Carlene Coria on 12/22/2021 10:47:15 Hogate, [name] ([id number]) -------------------------------------------------------------------------------- Wound Assessment Details Patient Name: [name] Date of Service: 12/22/2021 10:15 AM Medical Record Number: [id number] Patient Account Number: [id number] Date of Birth/Sex: 28-Jan-1923 (88 y.o. F) Treating RN: Carlene Coria Primary Care Alveria Mcglaughlin: Adrian Prows Other Clinician: Referring Haidee Stogsdill: Adrian Prows Treating Brodie Scovell/Extender: Skipper Cliche in Treatment: 31 Wound Status Wound Number: 7 Primary Pressure Ulcer Etiology: Wound Location: Right, Lateral Malleolus Wound Open Wounding Event: Gradually Appeared Status: Date Acquired: 05/12/2021 Comorbid Cataracts, Congestive Heart Failure, Hypertension, Weeks Of Treatment: 31 History: Peripheral Arterial Disease, Osteoarthritis, Neuropathy Clustered Wound: No Wound Measurements Length: (cm) 0.5 Width: (cm) 0.5 Depth: (cm) 0.2 Area: (cm) 0.196 Volume: (cm) 0.039 % Reduction in Area: 82.5% % Reduction in Volume: 82.7% Epithelialization: None Tunneling: No Undermining: No Wound Description Classification: Category/Stage III Exudate Amount: Medium Exudate Type: Serosanguineous Exudate Color: red, brown Foul Odor After Cleansing: No Slough/Fibrino Yes Wound Bed Granulation Amount: Medium (34-66%) Exposed Structure Granulation Quality: Red Fascia Exposed: No Necrotic Amount: Medium (34-66%) Fat Layer (Subcutaneous Tissue) Exposed: Yes Tendon Exposed: No Muscle Exposed: No Joint Exposed: No Bone Exposed: No Treatment Notes Wound #7 (Malleolus) Wound Laterality: Right, Lateral Cleanser Soap and Water Discharge Instruction: Gently cleanse wound with antibacterial soap, rinse and pat dry prior to  dressing wounds Peri-Wound Care Topical Primary Dressing Prisma 4.34 (in) Discharge Instruction: Moisten w/normal saline or sterile water; Cover wound as directed. Do not remove from wound bed. Secondary Dressing Gauze Discharge Instruction: As directed: dry, moistened with saline or moistened with Dakins Solution Secured With Compression Wrap Profore Lite LF 3 Multilayer Compression Bandaging System Discharge Instruction: Apply 3 multi-layer wrap as prescribed. WILLIETTE, LOEWE ([id number]) Compression Stockings Add-Ons Electronic Signature(s) Signed: 12/22/2021 11:44:23 AM By: Carlene Coria RN Entered By: Carlene Coria on 12/22/2021 10:24:27

## 2021-12-29 ENCOUNTER — Other Ambulatory Visit: Payer: Self-pay

## 2021-12-29 ENCOUNTER — Encounter: Payer: Medicare Other | Admitting: Physician Assistant

## 2021-12-29 DIAGNOSIS — L89513 Pressure ulcer of right ankle, stage 3: Secondary | ICD-10-CM | POA: Diagnosis not present

## 2021-12-29 NOTE — Progress Notes (Addendum)
PASTY, MANNINEN ([id number]) Visit Report for 12/29/2021 Chief Complaint Document Details Patient Name: [name], [name]. Date of Service: 12/29/2021 10:15 AM Medical Record Number: [id number] Patient Account Number: [id number] Date of Birth/Sex: 1923/11/24 (88 y.o. F) Treating RN: Carlene Coria Primary Care Provider: Adrian Prows Other Clinician: Referring Provider: Adrian Prows Treating Provider/Extender: Skipper Cliche in Treatment: 5 Information Obtained from: Patient Chief Complaint Right foot ulcer Electronic Signature(s) Signed: 12/29/2021 10:23:53 AM By: Worthy Keeler PA-C Entered By: Worthy Keeler on 12/29/2021 10:23:52 [name], [name] ([id number]) -------------------------------------------------------------------------------- Debridement Details Patient Name: [name] Date of Service: 12/29/2021 10:15 AM Medical Record Number: [id number] Patient Account Number: [id number] Date of Birth/Sex: 03-06-1923 (88 y.o. F) Treating RN: Carlene Coria Primary Care Provider: Adrian Prows Other Clinician: Referring Provider: Adrian Prows Treating Provider/Extender: Skipper Cliche in Treatment: 32 Debridement Performed for Wound #7 Right,Lateral Malleolus Assessment: Performed By: Physician Tommie Sams., PA-C Debridement Type: Debridement Level of Consciousness (Pre- Awake and Alert procedure): Pre-procedure Verification/Time Out Yes - 10:26 Taken: Start Time: 10:26 Pain Control: Lidocaine 4% Topical Solution Total Area Debrided (L x W): 1 (cm) x 1 (cm) = 1 (cm) Tissue and other material Viable, Non-Viable, Callus, Subcutaneous debrided: Level: Skin/Subcutaneous Tissue Debridement Description: Excisional Instrument: Curette Bleeding: Minimum Hemostasis Achieved: Pressure End Time: 10:35 Procedural Pain: 0 Post Procedural Pain: 0 Response to Treatment: Procedure was tolerated well Level of Consciousness (Post- Awake and  Alert procedure): Post Debridement Measurements of Total Wound Length: (cm) 0.5 Stage: Category/Stage III Width: (cm) 0.5 Depth: (cm) 0.2 Volume: (cm) 0.039 Character of Wound/Ulcer Post Debridement: Improved Post Procedure Diagnosis Same as Pre-procedure Electronic Signature(s) Signed: 12/29/2021 6:09:14 PM By: Worthy Keeler PA-C Signed: 12/30/2021 2:31:10 PM By: Carlene Coria RN Entered By: Carlene Coria on 12/29/2021 10:29:56 [name], [name] ([id number]) -------------------------------------------------------------------------------- HPI Details Patient Name: [name] Date of Service: 12/29/2021 10:15 AM Medical Record Number: [id number] Patient Account Number: [id number] Date of Birth/Sex: Mar 11, 1923 (88 y.o. 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 Dr. Amalia Hailey the podiatrist saw her on 07/20/2017 and sent her to the wound center for possible hyperbaric oxygen therapy. past medical history of peripheral vascular disease, varicose veins, status post appendectomy, basal cell carcinoma excision from the left leg, cholecystectomy, pacemaker placement, right lower extremity angiography done by Dr. dew in March 2017 with placement of a stent. there is also note of a successful ablation of the right small saphenous vein done which was reviewed by ultrasound on 10/24/2016. the patient had a right small saphenous vein ablation done on 10/20/2016. The patient has never been a smoker. She has been seen by Dr. Corene Cornea dew the vascular surgeon who most recently saw her on 06/15/2017 for evaluation of  ongoing problems with right leg swelling. She had a lower extremity arterial duplex examination done(02/13/17) which showed patent distal right superficial femoral artery stent and above-the-knee popliteal stent without evidence of restenosis. The ABI was more than 1.3 on the right and more than 1.3 on the left. This was consistent with noncompressible arteries due to medial calcification. The right great toe pressure and PPG waveforms are within normal limits and the left great toe pressure and PPG waveforms  are decreased. he recommended she continue to wear her compression stockings and continue with elevation. She is scheduled to have a noninvasive arterial study in the near future 08/16/2017 -- had a lower extremity arterial duplex examination done which showed patent distal right superficial femoral artery stent and above-the-knee popliteal stent without evidence of restenosis. The ABI was more than 1.3 on the right and more than 1.3 on the left. This was consistent with noncompressible arteries due to medial calcification. The right great toe pressure and PPG waveforms are within normal limits and the left great toe pressure and PPG waveforms are decreased. the x-ray of the right ankle has not yet been done 08/24/2017 -- had a right ankle x-ray -- IMPRESSION:1. No fracture, bone lesion or evidence of osteomyelitis. 2. Lateral soft tissue swelling with a soft tissue ulcer. she has not yet seen the vascular surgeon for review 08/31/17 on evaluation today patient's wound appears to be showing signs of improvement. She still with her appointment with vascular in order to review her results of her vascular study and then determine if any intervention would be recommended at that time. No fevers, chills, nausea, or vomiting noted at this time. She has been tolerating the dressing changes without complication. 09/28/17 on evaluation today patient's wound appears to show signs of good improvement in  regard to the granulation tissue which is surfacing. There is still a layer of slough covering the wound and the posterior portion is still significantly deeper than the anterior nonetheless there has been some good sign of things moving towards the better. She is going to go back to Dr. dew for reevaluation to ensure her blood flow is still appropriate. That will be before her next evaluation with [place] next week. No fevers, chills, nausea, or vomiting noted at this time. Patient does have some discomfort rated to be a 3-4/10 depending on activity specifically cleansing the wound makes it worse. 10/05/2017 -- the patient was seen by Dr. Lucky Cowboy last week and noninvasive studies showed a normal right ABI with brisk triphasic waveforms consistent with no arterial insufficiency including normal digital pressures. The duplex showed a patent distal right SFA stent and the proximal SFA was also normal. He was pleased with her test and thought she should have enough of perfusion for normal wound healing. He would see her back in 6 months time. 12/21/17 on evaluation today patient appears to be doing fairly well in regard to her right lateral ankle wound. Unfortunately the main issue that she is expansion at this point is that she is having some issues with what appears to be some cellulitis in the right anterior shin. She has also been noting a little bit of uncomfortable feeling especially last night and her ankle area. I'm afraid that she made the developing a little bit of an infection. With that being said I think it is in the early stages. 12/28/17 on evaluation today patient's ankle appears to be doing excellent. She's making good progress at this point the cellulitis seems to have improved after last week's evaluation. Overall she is having no significant discomfort which is excellent news. She does have an appointment with Dr. dew on March 29, 2018 for reevaluation in regard to the stent he placed. She seems  to have excellent blood flow in the right lower extremity. 01/19/12 on evaluation today patient's wound appears to be doing very well. In fact she does not appear to require debridement at this point, there's no evidence of infection, and  overall from the standpoint of the wound she seems to be doing very well. With that being said I believe that it may be time to switch to different dressing away from the [facility] [place] [facility] she tells me she does have a lot going on her friend actually passed away yesterday and she's also having a lot of issues with her husband this obviously is weighing heavy on her as far as your thoughts and concerns today. 01/25/18 on evaluation today patient appears to be doing fairly well in regard to her right lateral malleolus. She has been tolerating the dressing changes without complication. Overall I feel like this is definitely showing signs of improvement as far as how the overall appearance of the wound is there's also evidence of epithelium start to migrate over the granulation tissue. In general I think that she is progressing nicely as far as the wound is concerned. The only concern she really has is whether or not we can switch to every other week visits in order to avoid having as many appointments as her daughters have a difficult time getting her to her appointments as well as the patient's husband to his he is not doing very well at this point. 02/22/18 on evaluation today patient's right lateral malleolus ulcer appears to be doing great. She has been tolerating the dressing changes without complication. Overall you making excellent progress at this time. Patient is having no significant discomfort. [name], [name] ([id number]) 03/15/18 on evaluation today patient appears to be doing much more poorly in regard to her right lateral ankle ulcer at this point. Unfortunately since have last seen her her husband has passed just a few days ago is obviously weighed  heavily on her her daughter also had surgery well she is with her today as usual. There does not appear to be any evidence of infection she does seem to have significant contusion/deep tissue injury to the right lateral malleolus which was not noted previous when I saw her last. It's hard to tell of exactly when this injury occurred although during the time she was spending the night in the hospital this may have been most likely. 03/22/18 on evaluation today patient appears to actually be doing very well in regard to her ulcer. She did unfortunately have a setback which was noted last week however the good news is we seem to be getting back on track and in fact the wound in the core did still have some necrotic tissue which will be addressed at this point today but in general I'm seeing signs that things are on the up and up. She is glad to hear this obviously she's been somewhat concerned that due to the how her wound digressed more recently. 03/29/18 on evaluation today patient appears to be doing fairly well in regard to her right lower extremity lateral malleolus ulcer. She unfortunately does have a new area of pressure injury over the inferior portion where the wound has opened up a little bit larger secondary to the pressure she seems to be getting. She does tell me sometimes when she sleeps at night that it actually hurts and does seem to be pushing on the area little bit more unfortunately. There does not appear to be any evidence of infection which is good news. She has been tolerating the dressing changes without complication. She also did have some bruising in the left second and third toes due to the fact that she may have bump this or injured it although  she has neuropathy so she does not feel she did move recently that may have been where this came from. Nonetheless there does not appear to be any evidence of infection at this time. 04/12/18 on evaluation today patient's wound on the right  lateral ankle actually appears to be doing a little bit better with a lot of necrotic docking tissue centrally loosening up in clearing away. However she does have the beginnings of a deep tissue injury on the left lateral malleolus likely due to the fact we've been trying offload the right as much as we have. I think she may benefit from an assistive soft device to help with offloading and it looks like they're looking at one of the doughnut conditions that wraps around the lower leg to offload which I think will definitely do a good job. With that being said I think we definitely need to address this issue on the left before it becomes a wound. Patient is not having significant pain. 04/19/18 on evaluation today patient appears to be doing excellent in regard to the progress she's made with her right lateral ankle ulcer. The left ankle region which did show evidence of a deep tissue injury seems to be resolving there's little fluid noted underneath and a blister there's nothing open at this point in time overall I feel like this is progressing nicely which is good news. She does not seem to be having significant discomfort at this point which is also good news. 04/25/18-She is here in follow up evaluation for bilateral lateral malleolar ulcers. The right lateral malleolus ulcer with pale subcutaneous tissue exposure, central area of ulcer with tendon/periosteum exposed. The left lateral malleolus ulcer now with central area of nonviable tissue, otherwise deep tissue injury. She is wearing compression wraps to the left lower extremity, she will place the right lower extremity compression wraps on when she gets home. She will be out of town over the weekend and return next week and follow-up appointment. She completed her doxycycline this morning 05/03/18 on evaluation today patient appears to be doing very well in regard to her right lateral ankle ulcer in general. At least she's showing some signs of  improvement in this regard. Unfortunately she has some additional injury to the left lateral malleolus region which appears to be new likely even over the past several days. Again this determination is based on the overall appearance. With that being said the patient is obviously frustrated about this currently. 05/10/18-She is here in follow-up evaluation for bilateral lateral malleolar ulcers. She states she has purchased offloading shoes/boots and they will arrive tomorrow. She was asked to bring them in the office at next week's appointment so her provider is aware of product being utilized. She continues to sleep on right or left side, she has been encouraged to sleep on her back. The right lateral malleolus ulcer is precariously close to peri-osteum; will order xray. The left lateral malleolus ulcer is improved. Will switch back to santyl; she will follow up next week. 05/17/18 on evaluation today patient actually appears to be doing very well in regard to her malleolus her ulcers compared to last time I saw them. She does not seem to have as much in the way of contusion at this point which is great news. With that being said she does continue to have discomfort and I do believe that she is still continuing to benefit from the offloading/pressure reducing boots that were recommended. I think this is the key to  trying to get this to heal up completely. 05/24/18 on evaluation today patient actually appears to be doing worse at this point in time unfortunately compared to her last week's evaluation. She is having really no increased pain which is good news unfortunately she does have more maceration in your theme and noted surrounding the right lateral ankle the left lateral ankle is not really is erythematous I do not see signs of the overt cellulitis on that side. Unfortunately the wounds do not seem to have shown any signs of improvement since the last evaluation. She also has significant swelling  especially on the right compared to previous some of this may be due to infection however also think that she may be served better while she has these wounds by compression wrapping versus continuing to use the Juxta-Lite for the time being. Especially with the amount of drainage that she is experiencing at this point. No fevers, chills, nausea, or vomiting noted at this time. 05/31/18 on evaluation today patient appears to actually be doing better in regard to her right lateral lower extremity ulcer specifically on the malleolus region. She has been tolerating the antibiotic without complication. With that being said she still continues to have issues but a little bit of redness although nothing like she what she was experiencing previous. She still continues to pressure to her ankle area she did get the problem on offloading boots unfortunately she will not wear them she states there too uncomfortable and she can't get in and out of the bed. Nonetheless at this point her wounds seem to be continually getting worse which is not what we want I'm getting somewhat concerned about her progress and how things are going to proceed if we do not intervene in some way shape or form. I therefore had a very lengthy conversation today about offloading yet again and even made a specific suggestion for switching her to a memory foam mattress and even gave the information for a specific one that they could look at getting if it was something that they were interested in considering. She does not want to be considered for a hospital bed air mattress although honestly insurance would not cover it that she does not have any wounds on her trunk. 06/14/18 on evaluation today both wounds over the bilateral lateral malleolus her ulcers appear to be doing better there's no evidence of pressure injury at this point. She did get the foam mattress for her bed and this does seem to have been extremely beneficial for her in my  pinion. Her daughter states that she is having difficulty getting out of bed because of how soft it is. The patient also relates this to be. Nonetheless I do feel like she's actually doing better. Unfortunately right after and around the time she was getting the mattress she also sustained a fall when she got up to go pick up the phone and ended up injuring her right elbow she has 18 sutures in place. We are not caring for this currently although home health is going to be taking the sutures out shortly. Nonetheless this may be something that we need to evaluate going forward. It depends on how well it has or has not healed in the end. She also recently saw an orthopedic specialist for an injection in the right shoulder just before her fall unfortunately the fall seems to have worsened her pain. 06/21/18 on evaluation today patient appears to be doing about the same in regard to her  lateral malleolus ulcers. Both appear to be just a little bit deeper but again we are clinging away the necrotic and dead tissue which I think is why this is progressing towards a deeper realm as opposed [name], [name]. ([id number]) to improving from my measurement standpoint in that regard. Nonetheless she has been tolerating the dressing changes she absolutely hates the memory foam mattress topper that was obtained for her nonetheless I do believe this is still doing excellent as far as taking care of excess pressure in regard to the lateral malleolus regions. She in fact has no pressure injury that I see whereas in weeks past it was week by week I was constantly seeing new pressure injuries. Overall I think it has been very beneficial for her. 07/03/18; patient arrives in my clinic today. She has deep punched out areas over her bilateral lateral malleoli. The area on the right has some more depth. We spent a lot of time today talking about pressure relief for these areas. This started when her daughter asked for a  prescription for a memory foam mattress. I have never written a prescription for a mattress and I don't think insurances would pay for that on an ordinary bed. In any case he came up that she has foam boots that she refuses to wear. I would suggest going to these before any other offloading issues when she is in bed. They say she is meticulous about offloading this the rest of the day 07/10/18- She is seen in follow-up evaluation for bilateral, lateral malleolus ulcers. There is no improvement in the ulcers. She has purchased and is sleeping on a memory foam mattress/overlay, she has been using the offloading boots nightly over the past week. She has a follow up appointment with vascular medicine at the end of October, in my opinion this follow up should be expedited given her deterioration and suboptimal TBI results. We will order plain film xray of the left ankle as deeper structures are palpable; would consider having MRI, regardless of xray report(s). The ulcers will be treated with iodoflex/iodosorb, she is unable to safely change the dressings daily with santyl. 07/19/18 on evaluation today patient appears to be doing in general visually well in regard to her bilateral lateral malleolus ulcers. She has been tolerating the dressing changes without complication which is good news. With that being said we did have an x-ray performed on 07/12/18 which revealed a slight loosen see in the lateral portion of the distal left fibula which may represent artifact but underline lytic destruction or osteomyelitis could not be excluded. MRI was recommended. With that being said we can see about getting the patient scheduled for an MRI to further evaluate this area. In fact we have that scheduled currently for August 20 19,019. 07/26/18 on evaluation today patient's wound on the right lateral ankle actually appears to be doing fairly well at this point in my pinion. She has made some good progress currently. With  that being said unfortunately in regard to the left lateral ankle ulcer this seems to be a little bit more problematic at this time. In fact as I further evaluated the situation she actually had bone exposed which is the first time that's been the case in the bone appear to be necrotic. Currently I did review patient's note from Dr. Bunnie Domino office with [facility]. He stated that ABI was 1.26 on the right and 0.95 on the left with good waveforms. Her perfusion is stable not  reduced from previous studies and her digital waveforms were pretty good particularly on the right. His conclusion upon review of the note was that there was not much she could do to improve her perfusion and he felt she was adequate for wound healing. His suggestion was that she continued to see [place] and consider a synthetic skin graft if there was no underlying infection. He plans to see her back in six months or as needed. 08/01/18 on evaluation today patient appears to be doing better in regard to her right lateral ankle ulcer. Her left lateral ankle ulcer is about the same she still has bone involvement in evidence of necrosis. There does not appear to be evidence of infection at this time On the right lateral lower extremity. I have started her on the Augmentin she picked this up and started this yesterday. This is to get her through until she sees infectious disease which is scheduled for 08/12/18. 08/06/18 on evaluation today patient appears to be doing rather well considering my discussion with patient's daughter at the end of last week. The area which was marked where she had erythema seems to be improved and this is good news. With that being said overall the patient seems to be making good improvement when it comes to the overall appearance of the right lateral ankle ulcer although this has been slow she at least is coming around in this regard. Unfortunately in regard to the left lateral ankle ulcer this  is osteomyelitis based on the pathology report as well is bone culture. Nonetheless we are still waiting CT scan. Unfortunately the MRI we originally ordered cannot be performed as the patient is a pacemaker which I had overlooked. Nonetheless we are working on the CT scan approval and scheduling as of now. She did go to the hospital over the weekend and was placed on IV Cefzo for a couple of days. Fortunately this seems to have improved the erythema quite significantly which is good news. There does not appear to be any evidence of worsening infection at this time. She did have some bleeding after the last debridement therefore I did not perform any sharp debridement in regard to left lateral ankle at this point. Patient has been approved for a snap vac for the right lateral ankle. 08/14/18; the patient with wounds over her bilateral lateral malleoli. The area on the right actually looks quite good. Been using a snap back on this area. Healthy granulation and appears to be filling in. Unfortunately the area on the left is really problematic. 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 Also noted to have cellulitis. She saw Dr. Novella Olive of infectious disease today and was put on linezolid. We are able to verify this with her pharmacy. She is completed the Augmentin that she was already on. We've been using Iodoflex to this area 08/23/18 on evaluation today patient's wounds both actually appear to be doing better compared to my prior evaluations. Fortunately she showing signs of good improvement in regard to the overall wound status especially where were using the snap vac on the right. In regard to left lateral malleolus the wound bed actually appears to be much cleaner than previously noted. I do not feel any phone directly probed during evaluation today and though there is tendon noted this does not appear to be necrotic  it's actually fairly good as far as the overall appearance of the tendon  is concerned. In general the wound bed actually appears to be doing significantly better than it was previous. Patient is currently in the care of [name] and I did review that note today. He actually has her on two weeks of linezolid and then following the patient will be on 1-2 months of Keflex. That is the plan currently. She has been on antibiotics therapy as prescribed by myself initially starting on July 30, 2018 and has been on that continuously up to this point. 08/30/18 on evaluation today patient actually appears to be doing much better in regard to her right lateral malleolus ulcer. She has been tolerating the dressing changes specifically the snap vac without complication although she did have some issues with the seal currently. Apparently there was some trouble with getting it to maintain over the past week past Sunday. Nonetheless overall the wound appears better in regard to the right lateral malleolus region. In regard to left lateral malleolus this actually show some signs of additional granulation although there still tendon noted in the base of the wound this appears to be healthy not necrotic in any way whatsoever. We are considering potentially using a snap vac for the left lateral malleolus as well the product wrap from [employer], [place], was present in the clinic today we're going to see this patient I did have her come in with me after obtaining consent from the patient and her daughter in order to look at the wound and see if there's any recommendation one way or another as to whether or not they felt the snapback could be beneficial for the left lateral malleolus region. But the conclusion was that it might be but that this is definitely a little bit deeper wound than what traditionally would be utilized for a snap vac. 09/06/18 on evaluation today patient actually appears to be doing excellent in my pinion  in regard to both ankle ulcers. She has been tolerating the dressing changes without complication which is great news. Specifically we have been using the snap vac. In regard to the right ankle I'm not even sure that this is going to be necessary for today and following as the wound has filled in quite nicely. In regard to the left ankle I do believe [name], [name] ([id number]) that we're seeing excellent epithelialization from the edge as well as granulation in the central portion the tendon is still exposed but there's no evidence of necrotic bone and in general I feel like the patient has made excellent progress even compared to last week with just one week of the snap vac. 09/11/18; this is a patient who has wounds on her bilateral lateral malleoli. Initially both of these were deep stage IV wounds in the setting of chronic arterial insufficiency. She has been revascularized. As I understand think she been using snap vacs to both of these wounds however the area on the right became more superficial and currently she is only using it on the left. Using silver collagen on the right and silver collagen under the back on the left I believe 09/19/18 on evaluation today patient actually appears to be doing very well in regard to her lateral malleolus or ulcers bilaterally. She has been tolerating the dressing changes without complication. Fortunately there does not appear to be any evidence of infection at this time. Overall I feel like she is improving in an excellent manner and I'm very pleased with the fact that everything seems to be turning towards the better for  her. This has obviously been a long road. 09/27/18 on evaluation today patient actually appears to be doing very well in regard to her bilateral lateral malleolus ulcers. She has been tolerating the dressing changes without complication. Fortunately there does not appear to be any evidence of infection at this time which is also great  news. No fevers, chills, nausea, or vomiting noted at this time. Overall I feel like she is doing excellent with the snap vac on the left malleolus. She had 40 mL of fluid collection over the past week. 10/04/18 on evaluation today patient actually appears to be doing well in regard to her bilateral lateral malleolus ulcers. She continues to tolerate the dressing changes without complication. One issue that I see is the snap vac on the left lateral malleolus which appears to have sealed off some fluid underlying this area and has not really allowed it to heal to the degree that I would like to see. For that reason I did suggest at this point we may want to pack a small piece of packing strip into this region to allow it to more effectively wick out fluid. 10/11/18 in general the patient today does not feel that she has been doing very well. She's been a little bit lethargic and subsequently is having bodyaches as well according to what she tells me today. With that being said overall she has been concerned with the fact that something may be worsening although to be honest her wounds really have not been appearing poorly. She does have a new ulcer on her left heel unfortunately. This may be pressure related. Nonetheless it seems to me to have potentially started at least as a blister I do not see any evidence of deep tissue injury. In regard to the left ankle the snap vac still seems to be causing the ceiling off of the deeper part of the wound which is in turn trapping fluid. I'm not extremely pleased with the overall appearance as far as progress from last week to this week therefore I'm gonna discontinue the snap vac at this point. 10/18/18 patient unfortunately this point has not been feeling well for the past several days. She was seen by Grayland Ormond her primary care provider who is a Librarian, academic at [facility]. Subsequently she states that she's been very weak and generally feeling  malaise. No fevers, chills, nausea, or vomiting noted at this time. With that being said bloodwork was performed at the PCP office on the 11th of this month which showed a white blood cell count of 10.7. This was repeated today and shows a white blood cell count of 12.4. This does show signs of worsening. Coupled with the fact that she is feeling worse and that her left ankle wound is not really showing signs of improvement I feel like this is an indication that the osteomyelitis is likely exacerbating not improving. Overall I think we may also want to check her C-reactive protein and sedimentation rate. Actually did call [facility] office this afternoon while the patient was in the office here with me. Subsequently based on the findings we discussed treatment possibilities and I think that it is appropriate for [place] to go ahead and initiate treatment with doxycycline which I'm going to do. Subsequently he did agree to see about adding a CRP and sedimentation rate to her orders. If that has not already been drawn to where they can run it they will contact the patient she can come back to  have that check. They are in agreement with plan as far as the patient and her daughter are concerned. Nonetheless also think we need to get in touch with Dr. Henreitta Leber office to see about getting the patient scheduled with him as soon as possible. 11/08/18 on evaluation today patient presents for follow-up concerning her bilateral foot and ankle ulcers. I did do an extensive review of her chart in epic today. Subsequently she was seen by [name] he did initiate Cefepime IV antibiotic therapy. Subsequently she had some issues with her PICC line this had to be removed because it was coiled and then replaced. Fortunately that was now settled. Unfortunately she has continued have issues with her left heel as well as the issues that she is experiencing with her bilateral lateral malleolus regions. I do believe however  both areas seem to be doing a little bit better on evaluation today which is good news. No fevers, chills, nausea, or vomiting noted at this time. She actually has an angiogram schedule with Dr. dew on this coming Monday, November 11, 2018. Subsequently the patient states that she is feeling much better especially than what she was roughly 2 weeks ago. She actually had to cancel an appointment because she was feeling so poorly. No fevers, chills, nausea, or vomiting noted at this time. 11/15/18 on evaluation today patient actually is status post having had her angiogram with Dr. dew Monday, four days ago. 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 He had to go and work on several areas of the vasculature fortunately he was able to obtain no more than a 30% residual stenosis throughout post procedure. I reviewed this note today. I think this will definitely help with healing at this time. Fortunately there does not appear to be any signs of infection and I do feel like ratio already has a better appearance to it. 11/22/18 upon evaluation today patient actually appears to be doing very well in regard to her wounds in general. The right lateral malleolus looks excellent the heel looks better in the left lateral malleolus also appears to be doing a little better. With that being said the right second toe actually appears to be open and training we been watching this is been dry and stable but now is open. 12/03/2018 Seen today for follow-up and management of multiple bilateral lower extremity wounds. New pressure injury of the great toe which is closed at this time. Wound of the right distal second toe appears larger today with deep undermining and a pocket of fluid present within the undermining region. Left and right malleolus is wounds are stable today with no signs and symptoms of infection.Denies any needs or concerns during exam today. 12/13/18 on evaluation today patient  appears to be doing somewhat better in regard to her left heel ulcer. She also seems to be completely healed in regard to the right lateral malleolus ulcer. The left malleolus ulcer is smaller what unfortunately the wounds which are new over the first and second toes of the right foot are what are most concerning at this point especially the second. Both areas did require sharp debridement today. 12/20/18 on evaluation today patient's wound actually appears to be doing better in regard to left lateral ankle and her right lateral ankle continues to remain healed. The hill ulcer on the left is improved. She does have improvement noted as well in regard to both toe ulcers. Overall I'm very pleased in this  regard. No fevers, chills, nausea, or vomiting noted at this time. 12/23/18 on evaluation today patient is seen after she had her toenails trimmed at the podiatrist office due to issues with her right great toe. There was what appeared to be dark eschar on the surface of the wound which had her in the podiatrist concerned. Nonetheless as I remember that during the last office visit I had utilize silver nitrate of this area I was much less concerned about the situation. Subsequently I was able to clean off much of this tissue without any complication today. This does not appear to show any signs of infection and actually look somewhat better [name], [name] ([id number]) compared to last time post debridement. Her second toe on the right foot actually had callous over and there did appear still be some fluid underneath this that would require debridement today. 12/27/18 on evaluation today patient actually appears to be showing signs of improvement at all locations. Even the left lateral ankle although this is not quite as great as the other sites. Fortunately there does not appear to be any signs of infection at this time and both of her toes on the right foot seem to be showing signs of improvement  which is good news and very pleased in this regard. 01/03/19 on evaluation today patient appears to be doing better for the most part in regard to her wounds in particular. There does not appear to be any evidence of infection at this time which is good news. Fortunately there is no sign of really worsening anywhere except for the right great toe which she does have what appears to be a bruise/deep tissue injury which is very superficial and already resolving. I'm not sure where this came from I questioned her extensively and she does not recall what may have happened with this. Other than that the patient seems to be doing well even the left lateral ankle ulcer looks good and is getting smaller. 01/10/19 on evaluation today patient appears to be doing well in regard to her left heel wound and both of her toe wounds. Overall I feel like there is definitely improvement here and I'm happy in that regard. With that being said unfortunately she is having issues with the left lateral malleolus ulcer which unfortunately still has a lot of depth to it. This is gonna be a very difficult wound for [place] to be able to truly get to heal. I may want to consider some type of skin substitute to see if this would be of benefit for her. I'll discuss this with her more the next visit most likely. This was something I thought about more at the end of the visit when I was Artie out of the room and the patient had been discharged. 01/17/19 on evaluation today patient appears to be doing very well in regard to her wounds in general. She's been making excellent progress at this time. Fortunately there's no sign of infection at this time either. No fevers, chills, nausea, or vomiting noted at this time. The biggest issue is still her left lateral malleolus where it appears to be doing well and is getting smaller but still shows a small corner where this is deeper and goes down into what appears to be the joint space. Nonetheless  this is taking much longer to heal although it still looks better in smaller than previous evaluations. 01/24/19 on evaluation today patient's wounds actually appear to be doing rather well in general overall. She  did require some sharp debridement in regard to the right great toe but everything else appears to be doing excellent no debridement was even necessary. No fevers, chills, nausea, or vomiting noted at this time. 01/31/19 on evaluation today patient actually appears to be doing much better in regard to her left foot wound on the heel as well as the ankle. The right great toe appears to be a little bit worse today this had callous over and trapped a lot of fluid underneath. Fortunately there's no signs of infection at any site which is great news. 02/07/19 on evaluation today patient actually appears to be doing decently well in regard to all of her ulcers at this point. No sharp debridement was required she is a little bit of hyper granulation in regard to the left lateral ankle as well as the left heel but the hill itself is almost completely healed which is excellent news. Overall been very pleased in this regard. 02/14/19 on evaluation today patient actually appears to be doing very well in regard to her ulcers on the right first toe, left lateral malleolus, and left heel. In fact the heel is almost completely healed at this point. The patient does not show any signs of infection which is good news. Overall very pleased with how things have progressed. 04/18/19 Telehealth Evaluation During the COVID-19 National Emergency: Verbal Consent: Obtained from patient Allergies: reviewed and the active list is current. Medication changes: patient has no current medication changes. COVID-19 Screening: 1. Have you traveled internationally or on a cruise ship in the last 14 dayso No 2. Have you had contact with someone with or under investigation for COVID-19o No 3. Have you had a fever, cough, sore  throat, or experiencing shortness of breatho No on evaluation today actually did have a visit with this patient through a telehealth encounter with her home health nurse. Subsequently it was noted that the patient actually appears to be doing okay in regard to her wounds both the right great toe as well as the left lateral malleolus have shown signs of improvement although this in your theme around the left lateral malleolus there eschar coverings for both locations. The question is whether or not they are actually close and whether or not home health needs to discharge the patient or not. Nonetheless my concern is this point obviously is that without actually seeing her and being able to evaluate this directly I cannot ensure that she is completely healed which is the question that I'm being asked. 04/22/19 on evaluation today patient presents for her first evaluation since last time I saw her which was actually February 14, 2019. I did do a telehealth visit last week in which point it was questionable whether or not she may be healed and had to bring her in today for confirmation. With that being said she does seem to be doing quite well at this point which is good news. There does not appear to be any drainage in the deed I believe her wounds may be healed. Readmission: 09/04/2019 on evaluation today patient appears to be doing unfortunately somewhat more poorly in regard to her left foot ulcer secondary to a wound that began on 08/21/2019 at least when she first noticed this. Fortunately she has not had any evidence of active infection at this time. Systemically. I also do not necessarily see any evidence of infection at the blister/wound site on the first metatarsal head plantar aspect. This almost appears to be something that may  have just rubbed inappropriately causing this to breakdown. They did not want a wait too long to come in to be seen as again she had significant issues in the past with  wounds that took quite a while to heal in fact it was close to 2 years. Nonetheless this does not appear to be quite that bad but again we do need to remove some of the necrotic tissue from the surface of the wound to tell exactly the extent. She does not appear to have any significant arterial disease at this point and again her last ABIs and TBI's are recorded above in the alert section her left ABI was 1.27 with a TBI of 0.72 to the right ABI 1.08 with a TBI of 0.39. Other than this the patient has been doing quite well since I last saw her and that was in May 2020. 09/11/2019 on evaluation today patient appeared to be doing very well with regard to her plantar foot ulcer on the left. In fact this appears to be almost completely healed which is awesome. That is after just 1 week of intervention. With that being said there is no signs of active infection at this time. TAYLYN, BRAME ([id number]) 09/18/2019 on evaluation today patient actually appears to be doing excellent in fact she is completely healed based on what I am seeing at this point. Fortunately there is no signs of active infection at this time and overall patient is very pleased to hear that this area has healed so quickly. Readmission: 05/13/2021 upon evaluation today this patient presents for reevaluation here in the clinic. This is a wound that actually we previously took care of. She had 1 on the right ankle and the left the left turned out to be be harder due to to heal but nonetheless is doing great at this point as the right that has reopened and it was noted first just several weeks ago with a scab over it and came off in just the past few days. Fortunately there does not appear to be any obvious evidence of significant active infection at this time which is great news. No fevers, chills, nausea, vomiting, or diarrhea. 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 There does not  appear to be any signs of this interfering in any way with her wound. She does have swelling we previously had compression socks for her ordered but again it does not look like she wears these on a regular basis by any means. 05/26/2021 upon evaluation today patient appears to be doing well with regard to her wound which is actually showing signs of excellent improvement. There does not appear to be any signs of active infection which is great news and overall very pleased with where things stand today. No fevers, chills, nausea, vomiting, or diarrhea. 06/02/2021 upon evaluation today patient's wound actually showing signs of excellent improvement. Fortunately there does not appear to be any signs of active infection which is great news. I think the patient is making good progress with regard to her wounds in general. 06/09/2021 upon evaluation today patient appears to be doing excellent in regard to her wounds currently. Fortunately there does not appear to be any signs of active infection which is great news. No fevers, chills, nausea, vomiting, or diarrhea. Overall extremely pleased with where things stand today. I think the patient is making excellent progress. 06/16/2021 upon evaluation today patient appears to be doing well in regard  to her wound. This is going require little bit of debridement today and that was discussed with the patient. Otherwise she seems to be doing quite well and I am actually very pleased with where things stand at this point. No fevers, chills, nausea, vomiting, or diarrhea. 06/23/2021 upon evaluation today patient appears to be doing well with regard to her wounds. She has been tolerating the dressing changes without complication. Fortunately there does not appear to be any evidence of infection and she has not had air in her home which she actually lives at an assisted living that got fixed this morning. With that being said because of that her wrap has been extremely hot  and bothersome for her over the past week. 06/30/2021 upon evaluation today patient is actually making excellent progress in regard to her ankle ulcer. She has been tolerating the dressing changes without complication and overall extremely pleased with where things stand there does not appear to be any evidence of active infection which is great news. No fevers, chills, nausea, vomiting, or diarrhea. 07/07/21 upon evaluation today patients and culture on the right actually appears to be doing quite well. There does not appear to be any signs of infection and overall very pleased with where things stand today. No fevers, chills, nausea, or vomiting noted at this time. 07/14/2021 unfortunately the patient today has some evidence of deep tissue injury and pressure getting to the ankle region. Again I am not exactly sure what is going on here but this is very similar to issues that we have had in the past. I explained to the patient that she needs to be very mindful of exactly what is happening I think sleeping in bed is probably the main issue here although there could be other culprits I am not sure what else would potentially lead to this kind of a problem for her. 07/21/2021 upon evaluation today patient's wound actually showing signs of improvement compared to last week. Fortunately there does not appear to be any signs of active infection which is great news and overall very pleased with where things stand in that regard. With that being said I do believe that she is continuing to show signs of overall of getting better although I think this is still basically about what we were 2 weeks ago due to the worsening and now improvement. 07/28/2021 upon evaluation today patient appears to be doing well with regard to her wound. She does have some slough buildup on the surface of the wound which I would have to manage today. Fortunately there is no sign of active infection at this time. No fevers, chills,  nausea, vomiting, or diarrhea. 08/04/2021 upon evaluation today patient appears to be doing about the same in regard to her wound. To be perfectly honest I am beginning to be a little bit concerned about the overall appearance of the wound bed. I do think possibly taking a sample right around the margin of the wound could be beneficial for her as far as identifying anything such as an inflammatory process or to be honest even a skin tag cancer type process that may be of concern here. Fortunately there does not appear to be any evidence of active infection at this time which is great news she is not having any pain also great news. 08/11/2021 upon evaluation today patient appears to be doing well with regard to her wound. The good news is I did review her biopsy results and it showed some inflammatory mixed  findings but nothing that appeared to be malignant which is great news. Overall this is more of a chronic venous stasis type issue which again is more what we have been treating. Nonetheless I just wanted to make sure before going forward that there was not anything more untoward going on at this point. 08/18/2021 upon evaluation today patient appears to be doing well with regard to her ankle ulcer. Fortunately there does not appear to be any signs of active infection at this time which is great overall wound is dramatically improved compared to last week. Since last week I have actually placed her on doxycycline and subsequently this is a good option as far as the findings are concerned at this point. 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 the doxycycline is doing that. There does not appear to be any evidence of active infection systemically which is great news. 08/25/2021 upon evaluation today patient appears to be doing well with regard to her wound. I feel like we are  finally get back on track as far as healing is concerned I am much happier with the overall appearance today. I do think that she is tolerating the dressing changes without complication which is great news. We have been using Hydrofera Blue which I think is a good option. The good news is she is also doing great in regard to her compression sock on the left which is a zipper compression that seems to be doing a great job keeping her edema under good control. 09/01/2021 upon evaluation today patient appears to be doing well with regard to her wound. Infection seems to be under much better control which is great news and very pleased in that regard. Fortunately there does not appear to be any signs of infection currently. [name], [name] ([id number]) 09/08/2021 upon evaluation today patient actually appears to be making good progress in regard to her wound. She has been tolerating the dressing changes without complication. Fortunately there does not appear to be any evidence of active infection at this time which is great news as well. No fevers, chills, nausea, vomiting, or diarrhea. 09/22/2021 upon evaluation today patient appears to be doing well with regard to her wound although is very slow to heal. We have not looked into Apligraf yet I think that is something that we should see about doing. 09/29/2021 upon evaluation today patient's wound is actually showing signs of doing about the same. I am not seeing any evidence of worsening but also no significant evidence of improvement. We did gain approval for the organogenesis products all except for Apligraf as covered by her insurance. With that being said I do think that we can go ahead and proceed with the NuShield if the patient and her family in agreement of the plan I discussed that with him today she does have a 20% coinsurance which we also discussed. 10/06/2021 upon evaluation today patient appears to unfortunately be doing a little bit  worse she appears to be infected based on what I am seeing. Fortunately there does not appear to be any signs of active infection at this time which is great news. Unfortunately it does appear to be some evidence of around the wound edge indicated by way of erythema and warmth as well as redness 10/13/2021 upon evaluation today patient actually appears to be doing excellent in regard to  her ankle ulcer compared to what it was. 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 I think were fine to go ahead and apply the NuShield today. 10/20/2021 upon evaluation today patient appears to be doing excellent in fact the NuShield seems to have done all some for her thus far. I am actually very pleased with where we stand and overall I think that she is making great progress. There is no evidence of active infection at this time. 11/23; patient presents for follow-up. She has no issues or complaints today. She denies signs of infection. She reports stability in her wound healing. 11/03/2021 upon evaluation today patient appears to be doing well with regard to her wounds. She has been tolerating the dressing changes without complication. Fortunately there is no signs of active infection at this time. 11/10/2021 upon evaluation today patient appears to be doing well with regard to her right ankle which is actually showing signs of improvement with the NuShield I am very pleased. Subsequently in regards to the left ankle this appears to be doing okay with no evidence of issue here either. With that being said she has a significant contusion on the left leg from having fallen 2 days ago. She tells me that she was using her walker going to the closet and then when she got to the closet turned around to get something out at  which point she fell according to the story. Nonetheless she does have a hematoma just below her knee on the anterior portion of her shin. My hope is that this will not open until wound although we do need I think Some compression over it also think that we need to have her use an ice as well to help with the swelling and prevent this from getting worse. The last thing she needs is a wound opened up here. 11/17/2021 upon evaluation patient's right ankle actually showing signs of improvement based on what I see currently there is a lot of new skin growth coming in which is great news. Nonetheless I do feel like that the patient is showing improvement as well in regard to her left leg with the use of the Tubigrip it swollen but not as bruised as what I would have expected after what I was seeing last week. Nonetheless I do think doubling up on the Tubigrip would probably be beneficial to try to keep some of the edema under better control here. 11/24/2021 upon evaluation today patient appears to be doing excellent in regard to her wound. This is actually measuring significantly smaller which is great news. Fortunately I do not see any signs of active infection locally nor systemically at this point. Overall I am very pleased with how the NuShield is doing. 12/30; wound bed looks healthy however not much change in overall wound volume. We applied Nushield again today in the standard fashion 12/08/2021 upon evaluation today patient's wound actually showing signs of good improvement and I am actually very pleased with where we stand today as well. I do not see any evidence of active infection locally nor systemically which is great news. Unfortunately she has been having some issues with stomach upset and diarrhea today. 12/15/2021 upon evaluation today patient appears to be doing excellent in regard to her wound. There is a little bit  of dry skin raised up around the edges of the wound but fortunately  nothing too significant at this point. Fortunately I do not see any evidence of active infection either which is great news. No fevers, chills, nausea, vomiting, or diarrhea. 12/29/2021 upon evaluation today patient appears to be doing well with regard to her wound. In general I feel like she is getting very close to complete resolution. I think that she is making good progress here as well. Overall I do not see any signs of infection and very little of this appears to actually be open. Electronic Signature(s) Signed: 12/29/2021 10:41:07 AM By: Worthy Keeler PA-C Entered By: Worthy Keeler on 12/29/2021 10:41:06 [place], [name] ([id number]) -------------------------------------------------------------------------------- Physical Exam Details Patient Name: [name] Date of Service: 12/29/2021 10:15 AM Medical Record Number: [id number] Patient Account Number: [id number] Date of Birth/Sex: [birth year] (88 y.o. F) Treating RN: Carlene Coria Primary Care Provider: Adrian Prows Other Clinician: Referring Provider: Adrian Prows Treating Provider/Extender: Skipper Cliche in Treatment: 43 Constitutional Well-nourished and well-hydrated in no acute distress. Respiratory normal breathing without difficulty. Psychiatric this patient is able to make decisions and demonstrates good insight into disease process. Alert and Oriented x 3. pleasant and cooperative. Notes Upon inspection patient's wound bed showed signs of good granulation and epithelization at this point. Fortunately I do not see any evidence of much that is open there is a rim around the edges and there is an indention but there appears to be mostly new skin there is just a couple small spots that are still remaining open otherwise I think this looks excellent. We have already biopsied this previous and there was no evidence of a carcinoma. I think this is just scar tissue from the way the wound has  healed. Electronic Signature(s) Signed: 12/29/2021 10:41:37 AM By: Worthy Keeler PA-C Entered By: Worthy Keeler on 12/29/2021 10:41:37 [name], [name] ([id number]) -------------------------------------------------------------------------------- Physician Orders Details Patient Name: [name] Date of Service: 12/29/2021 10:15 AM Medical Record Number: [id number] Patient Account Number: [id number] Date of Birth/Sex: 05/01/1923 (88 y.o. 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 Bathing/ Shower/ Hygiene o May shower with wound dressing protected with water repellent cover or cast protector. Edema Control - Lymphedema / Segmental Compressive Device / Other o Optional: One layer of unna paste to top of compression wrap (to act as an anchor). - and at toes o Patient to wear own compression stockings. Remove compression stockings every night before going to bed and put on every morning when getting up. - left leg o Elevate, Exercise Daily and Avoid Standing for Long Periods of Time. o Elevate legs to the level of the heart and pump ankles as often as possible o Elevate leg(s) parallel to the floor when sitting. Wound Treatment Wound #7 - Malleolus Wound Laterality: Right, Lateral Cleanser: Soap and Water 1 x Per Week/30 Days Discharge Instructions: Gently cleanse wound with antibacterial soap, rinse and pat dry prior to dressing wounds Primary Dressing: Prisma 4.34  (in) 1 x Per Week/30 Days Discharge Instructions: Moisten w/normal saline or sterile water; Cover wound  as directed. Do not remove from wound bed. Secondary Dressing: Gauze 1 x Per Week/30 Days Discharge Instructions: As directed: dry, moistened with saline or moistened with Dakins Solution Compression Wrap: Profore Lite LF 3 Multilayer Compression Bandaging System 1 x Per Week/30 Days Discharge Instructions: Apply 3 multi-layer wrap as prescribed. Electronic Signature(s) Signed: 12/29/2021 6:09:14 PM By: Worthy Keeler PA-C Signed: 12/30/2021 2:31:10 PM By: Carlene Coria RN Entered By: Carlene Coria on 12/29/2021 10:28:31 [name], [name] ([id number]) -------------------------------------------------------------------------------- Problem List Details Patient Name: DANETTA, PROM Date of Service: 12/29/2021 10:15 AM Medical Record Number: [id number] Patient Account Number: [id number] Date of Birth/Sex: 09-04-1923 (88 y.o. 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, stage 3 05/13/2021 No Yes I89.0 Lymphedema, not elsewhere classified 05/13/2021 No Yes I87.2 Venous insufficiency (chronic) (peripheral) 05/13/2021 No Yes Z95.0 Presence of cardiac pacemaker 05/13/2021 No Yes Z79.01 Long term (current) use of anticoagulants 05/13/2021 No Yes Inactive Problems ICD-10 Code Description Active Date Inactive Date S80.12XA Contusion of left lower leg, initial encounter 11/10/2021 11/10/2021 Resolved Problems Electronic Signature(s) Signed: 12/29/2021 10:23:26 AM By: Worthy Keeler PA-C Entered By: Worthy Keeler on 12/29/2021 10:23:26 [name], [name]  ([id number]) -------------------------------------------------------------------------------- Progress Note Details Patient Name: [name] Date of Service: 12/29/2021 10:15 AM Medical Record Number: [id number] Patient Account Number: [id number] Date of Birth/Sex: March 04, 1923 (88 y.o. 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 Dr. Amalia Hailey the podiatrist saw her on 07/20/2017 and sent her to the wound center for possible hyperbaric oxygen therapy. past medical history of peripheral vascular disease, varicose veins, status post appendectomy, basal cell carcinoma excision from the left leg, cholecystectomy, pacemaker placement, right lower extremity angiography done by Dr. dew in March 2017 with placement of a stent. there is also note of a successful ablation of the right small saphenous vein done which was reviewed by ultrasound on 10/24/2016. the patient had a right small saphenous vein ablation done on 10/20/2016. The patient has never been a smoker. She has been seen by Dr. Corene Cornea dew the vascular surgeon who most recently saw her on 06/15/2017 for evaluation of ongoing problems with right leg swelling. She had a lower extremity arterial duplex examination done(02/13/17) which showed patent distal right superficial femoral artery stent and above-the-knee popliteal stent without evidence of restenosis. The ABI was more than 1.3 on the right and more than 1.3 on the left. This was consistent with noncompressible arteries due to medial  calcification. The right great toe pressure and PPG waveforms are within normal limits and the left great toe pressure and PPG waveforms are decreased. he recommended she continue to wear her compression stockings and continue with elevation. She is scheduled to have a noninvasive arterial study in the near future 08/16/2017 -- had a lower extremity arterial duplex examination done which showed patent distal right superficial femoral artery stent and above-the-knee popliteal stent without evidence of restenosis. The ABI was more than 1.3 on the right and more than  1.3 on the left. This was consistent with noncompressible arteries due to medial calcification. The right great toe pressure and PPG waveforms are within normal limits and the left great toe pressure and PPG waveforms are decreased. the x-ray of the right ankle has not yet been done 08/24/2017 -- had a right ankle x-ray -- IMPRESSION:1. No fracture, bone lesion or evidence of osteomyelitis. 2. Lateral soft tissue swelling with a soft tissue ulcer. she has not yet seen the vascular surgeon for review 08/31/17 on evaluation today patient's wound appears to be showing signs of improvement. She still with her appointment with vascular in order to review her results of her vascular study and then determine if any intervention would be recommended at that time. No fevers, chills, nausea, or vomiting noted at this time. She has been tolerating the dressing changes without complication. 09/28/17 on evaluation today patient's wound appears to show signs of good improvement in regard to the granulation tissue which is surfacing. There is still a layer of slough covering the wound and the posterior portion is still significantly deeper than the anterior nonetheless there has been some good sign of things moving towards the better. She is going to go back to Dr. dew for reevaluation to ensure her blood flow is still appropriate. That will be before  her next evaluation with [place] next week. No fevers, chills, nausea, or vomiting noted at this time. Patient does have some discomfort rated to be a 3-4/10 depending on activity specifically cleansing the wound makes it worse. 10/05/2017 -- the patient was seen by Dr. Lucky Cowboy last week and noninvasive studies showed a normal right ABI with brisk triphasic waveforms consistent with no arterial insufficiency including normal digital pressures. The duplex showed a patent distal right SFA stent and the proximal SFA was also normal. He was pleased with her test and thought she should have enough of perfusion for normal wound healing. He would see her back in 6 months time. 12/21/17 on evaluation today patient appears to be doing fairly well in regard to her right lateral ankle wound. Unfortunately the main issue that she is expansion at this point is that she is having some issues with what appears to be some cellulitis in the right anterior shin. She has also been noting a little bit of uncomfortable feeling especially last night and her ankle area. I'm afraid that she made the developing a little bit of an infection. With that being said I think it is in the early stages. 12/28/17 on evaluation today patient's ankle appears to be doing excellent. She's making good progress at this point the cellulitis seems to have improved after last week's evaluation. Overall she is having no significant discomfort which is excellent news. She does have an appointment with Dr. dew on March 29, 2018 for reevaluation in regard to the stent he placed. She seems to have excellent blood flow in the right lower extremity. 01/19/12 on evaluation today patient's wound appears to be doing very well. In fact she does not appear to require debridement at this point, there's no evidence of infection, and overall from the standpoint of the wound she seems to be doing very well. With that being said I believe that it may be time to switch to  different dressing away from the [facility] Dressing she tells me she does have a lot going on her friend actually passed away yesterday and she's also having a lot of issues with her husband this obviously is weighing  heavy on her as far as your thoughts and concerns today. 01/25/18 on evaluation today patient appears to be doing fairly well in regard to her right lateral malleolus. She has been tolerating the dressing changes without complication. Overall I feel like this is definitely showing signs of improvement as far as how the overall appearance of the wound is there's also evidence of epithelium start to migrate over the granulation tissue. In general I think that she is progressing nicely as far as the wound is concerned. The only concern she really has is whether or not we can switch to every other week visits in order to avoid having as many [name], [name] ([id number]) appointments as her daughters have a difficult time getting her to her appointments as well as the patient's husband to his he is not doing very well at this point. 02/22/18 on evaluation today patient's right lateral malleolus ulcer appears to be doing great. She has been tolerating the dressing changes without complication. Overall you making excellent progress at this time. Patient is having no significant discomfort. 03/15/18 on evaluation today patient appears to be doing much more poorly in regard to her right lateral ankle ulcer at this point. Unfortunately since have last seen her her husband has passed just a few days ago is obviously weighed heavily on her her daughter also had surgery well she is with her today as usual. There does not appear to be any evidence of infection she does seem to have significant contusion/deep tissue injury to the right lateral malleolus which was not noted previous when I saw her last. It's hard to tell of exactly when this injury occurred although during the time she was  spending the night in the hospital this may have been most likely. 03/22/18 on evaluation today patient appears to actually be doing very well in regard to her ulcer. She did unfortunately have a setback which was noted last week however the good news is we seem to be getting back on track and in fact the wound in the core did still have some necrotic tissue which will be addressed at this point today but in general I'm seeing signs that things are on the up and up. She is glad to hear this obviously she's been somewhat concerned that due to the how her wound digressed more recently. 03/29/18 on evaluation today patient appears to be doing fairly well in regard to her right lower extremity lateral malleolus ulcer. She unfortunately does have a new area of pressure injury over the inferior portion where the wound has opened up a little bit larger secondary to the pressure she seems to be getting. She does tell me sometimes when she sleeps at night that it actually hurts and does seem to be pushing on the area little bit more unfortunately. There does not appear to be any evidence of infection which is good news. She has been tolerating the dressing changes without complication. She also did have some bruising in the left second and third toes due to the fact that she may have bump this or injured it although she has neuropathy so she does not feel she did move recently that may have been where this came from. Nonetheless there does not appear to be any evidence of infection at this time. 04/12/18 on evaluation today patient's wound on the right lateral ankle actually appears to be doing a little bit better with a lot of necrotic docking tissue centrally loosening up in clearing away.  However she does have the beginnings of a deep tissue injury on the left lateral malleolus likely due to the fact we've been trying offload the right as much as we have. I think she may benefit from an assistive soft device to  help with offloading and it looks like they're looking at one of the doughnut conditions that wraps around the lower leg to offload which I think will definitely do a good job. With that being said I think we definitely need to address this issue on the left before it becomes a wound. Patient is not having significant pain. 04/19/18 on evaluation today patient appears to be doing excellent in regard to the progress she's made with her right lateral ankle ulcer. The left ankle region which did show evidence of a deep tissue injury seems to be resolving there's little fluid noted underneath and a blister there's nothing open at this point in time overall I feel like this is progressing nicely which is good news. She does not seem to be having significant discomfort at this point which is also good news. 04/25/18-She is here in follow up evaluation for bilateral lateral malleolar ulcers. The right lateral malleolus ulcer with pale subcutaneous tissue exposure, central area of ulcer with tendon/periosteum exposed. The left lateral malleolus ulcer now with central area of nonviable tissue, otherwise deep tissue injury. She is wearing compression wraps to the left lower extremity, she will place the right lower extremity compression wraps on when she gets home. She will be out of town over the weekend and return next week and follow-up appointment. She completed her doxycycline this morning 05/03/18 on evaluation today patient appears to be doing very well in regard to her right lateral ankle ulcer in general. At least she's showing some signs of improvement in this regard. Unfortunately she has some additional injury to the left lateral malleolus region which appears to be new likely even over the past several days. Again this determination is based on the overall appearance. With that being said the patient is obviously frustrated about this currently. 05/10/18-She is here in follow-up evaluation for  bilateral lateral malleolar ulcers. She states she has purchased offloading shoes/boots and they will arrive tomorrow. She was asked to bring them in the office at next week's appointment so her provider is aware of product being utilized. She continues to sleep on right or left side, she has been encouraged to sleep on her back. The right lateral malleolus ulcer is precariously close to peri-osteum; will order xray. The left lateral malleolus ulcer is improved. Will switch back to santyl; she will follow up next week. 05/17/18 on evaluation today patient actually appears to be doing very well in regard to her malleolus her ulcers compared to last time I saw them. She does not seem to have as much in the way of contusion at this point which is great news. With that being said she does continue to have discomfort and I do believe that she is still continuing to benefit from the offloading/pressure reducing boots that were recommended. I think this is the key to trying to get this to heal up completely. 05/24/18 on evaluation today patient actually appears to be doing worse at this point in time unfortunately compared to her last week's evaluation. She is having really no increased pain which is good news unfortunately she does have more maceration in your theme and noted surrounding the right lateral ankle the left lateral ankle is not really is erythematous  I do not see signs of the overt cellulitis on that side. Unfortunately the wounds do not seem to have shown any signs of improvement since the last evaluation. She also has significant swelling especially on the right compared to previous some of this may be due to infection however also think that she may be served better while she has these wounds by compression wrapping versus continuing to use the Juxta-Lite for the time being. Especially with the amount of drainage that she is experiencing at this point. No fevers, chills, nausea, or vomiting  noted at this time. 05/31/18 on evaluation today patient appears to actually be doing better in regard to her right lateral lower extremity ulcer specifically on the malleolus region. She has been tolerating the antibiotic without complication. With that being said she still continues to have issues but a little bit of redness although nothing like she what she was experiencing previous. She still continues to pressure to her ankle area she did get the problem on offloading boots unfortunately she will not wear them she states there too uncomfortable and she can't get in and out of the bed. Nonetheless at this point her wounds seem to be continually getting worse which is not what we want I'm getting somewhat concerned about her progress and how things are going to proceed if we do not intervene in some way shape or form. I therefore had a very lengthy conversation today about offloading yet again and even made a specific suggestion for switching her to a memory foam mattress and even gave the information for a specific one that they could look at getting if it was something that they were interested in considering. She does not want to be considered for a hospital bed air mattress although honestly insurance would not cover it that she does not have any wounds on her trunk. 06/14/18 on evaluation today both wounds over the bilateral lateral malleolus her ulcers appear to be doing better there's no evidence of pressure injury at this point. She did get the foam mattress for her bed and this does seem to have been extremely beneficial for her in my pinion. Her daughter states that she is having difficulty getting out of bed because of how soft it is. The patient also relates this to be. Nonetheless I do feel like she's actually doing better. Unfortunately right after and around the time she was getting the mattress she also sustained a fall when she got up to go pick up the phone and ended up injuring her  right elbow she has 18 sutures in place. We are not caring for this currently although home health is going to be taking the sutures out shortly. Nonetheless this may be something that we need to evaluate going forward. It depends on HIKARI, TRIPP ([id number]) how well it has or has not healed in the end. She also recently saw an orthopedic specialist for an injection in the right shoulder just before her fall unfortunately the fall seems to have worsened her pain. 06/21/18 on evaluation today patient appears to be doing about the same in regard to her lateral malleolus ulcers. Both appear to be just a little bit deeper but again we are clinging away the necrotic and dead tissue which I think is why this is progressing towards a deeper realm as opposed to improving from my measurement standpoint in that regard. Nonetheless she has been tolerating the dressing changes she absolutely hates the memory foam mattress topper that  was obtained for her nonetheless I do believe this is still doing excellent as far as taking care of excess pressure in regard to the lateral malleolus regions. She in fact has no pressure injury that I see whereas in weeks past it was week by week I was constantly seeing new pressure injuries. Overall I think it has been very beneficial for her. 07/03/18; patient arrives in my clinic today. She has deep punched out areas over her bilateral lateral malleoli. The area on the right has some more depth. We spent a lot of time today talking about pressure relief for these areas. This started when her daughter asked for a prescription for a memory foam mattress. I have never written a prescription for a mattress and I don't think insurances would pay for that on an ordinary bed. In any case he came up that she has foam boots that she refuses to wear. I would suggest going to these before any other offloading issues when she is in bed. They say she is meticulous about offloading this  the rest of the day 07/10/18- She is seen in follow-up evaluation for bilateral, lateral malleolus ulcers. There is no improvement in the ulcers. She has purchased and is sleeping on a memory foam mattress/overlay, she has been using the offloading boots nightly over the past week. She has a follow up appointment with vascular medicine at the end of October, in my opinion this follow up should be expedited given her deterioration and suboptimal TBI results. We will order plain film xray of the left ankle as deeper structures are palpable; would consider having MRI, regardless of xray report(s). The ulcers will be treated with iodoflex/iodosorb, she is unable to safely change the dressings daily with santyl. 07/19/18 on evaluation today patient appears to be doing in general visually well in regard to her bilateral lateral malleolus ulcers. She has been tolerating the dressing changes without complication which is good news. With that being said we did have an x-ray performed on 07/12/18 which revealed a slight loosen see in the lateral portion of the distal left fibula which may represent artifact but underline lytic destruction or osteomyelitis could not be excluded. MRI was recommended. With that being said we can see about getting the patient scheduled for an MRI to further evaluate this area. In fact we have that scheduled currently for August 20 19,019. 07/26/18 on evaluation today patient's wound on the right lateral ankle actually appears to be doing fairly well at this point in my pinion. She has made some good progress currently. With that being said unfortunately in regard to the left lateral ankle ulcer this seems to be a little bit more problematic at this time. In fact as I further evaluated the situation she actually had bone exposed which is the first time that's been the case in the bone appear to be necrotic. Currently I did review patient's note from Dr. Bunnie Domino office with [facility]. He stated that ABI was 1.26 on the right and 0.95 on the left with good waveforms. Her perfusion is stable not reduced from previous studies and her digital waveforms were pretty good particularly on the right. His conclusion upon review of the note was that there was not much she could do to improve her perfusion and he felt she was adequate for wound healing. His suggestion was that she continued to see [place] and consider a synthetic skin graft if there was no underlying infection. He plans  to see her back in six months or as needed. 08/01/18 on evaluation today patient appears to be doing better in regard to her right lateral ankle ulcer. Her left lateral ankle ulcer is about the same she still has bone involvement in evidence of necrosis. There does not appear to be evidence of infection at this time On the right lateral lower extremity. I have started her on the Augmentin she picked this up and started this yesterday. This is to get her through until she sees infectious disease which is scheduled for 08/12/18. 08/06/18 on evaluation today patient appears to be doing rather well considering my discussion with patient's daughter at the end of last week. The area which was marked where she had erythema seems to be improved and this is good news. With that being said overall the patient seems to be making good improvement when it comes to the overall appearance of the right lateral ankle ulcer although this has been slow she at least is coming around in this regard. Unfortunately in regard to the left lateral ankle ulcer this is osteomyelitis based on the pathology report as well is bone culture. Nonetheless we are still waiting CT scan. Unfortunately the MRI we originally ordered cannot be performed as the patient is a pacemaker which I had overlooked. Nonetheless we are working on the CT scan approval and scheduling as of now. She did go to the hospital over the weekend and was placed on  IV Cefzo for a couple of days. Fortunately this seems to have improved the erythema quite significantly which is good news. There does not appear to be any evidence of worsening infection at this time. She did have some bleeding after the last debridement therefore I did not perform any sharp debridement in regard to left lateral ankle at this point. Patient has been approved for a snap vac for the right lateral ankle. 08/14/18; the patient with wounds over her bilateral lateral malleoli. The area on the right actually looks quite good. Been using a snap back on this area. Healthy granulation and appears to be filling in. Unfortunately the area on the left is really problematic. 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 Also noted to have cellulitis. She saw Dr. Novella Olive of infectious disease today and was put on linezolid. We are able to verify this with her pharmacy. She is completed the Augmentin that she was already on. We've been using Iodoflex to this area 08/23/18 on evaluation today patient's wounds both actually appear to be doing better compared to my prior evaluations. Fortunately she showing signs of good improvement in regard to the overall wound status especially where were using the snap vac on the right. In regard to left lateral malleolus the wound bed actually appears to be much cleaner than previously noted. I do not feel any phone directly probed during evaluation today and though there is tendon noted this does not appear to be necrotic it's actually fairly good as far as the overall appearance of the tendon is concerned. In general the wound bed actually appears to be doing significantly better than it was previous. Patient is currently in the care of [name] and I did review that note today. He actually has her on two weeks of linezolid and then following the patient will be on 1-2 months of Keflex.  That is the plan currently. She has been on antibiotics therapy as  prescribed by myself initially starting on July 30, 2018 and has been on that continuously up to this point. 08/30/18 on evaluation today patient actually appears to be doing much better in regard to her right lateral malleolus ulcer. She has been tolerating the dressing changes specifically the snap vac without complication although she did have some issues with the seal currently. Apparently there was some trouble with getting it to maintain over the past week past Sunday. Nonetheless overall the wound appears better in regard to the right lateral malleolus region. In regard to left lateral malleolus this actually show some signs of additional granulation although there still tendon noted in the base of the wound this appears to be healthy not necrotic in any way whatsoever. We are considering potentially using a snap vac for the left lateral malleolus as well the product wrap from [employer], was present in the clinic today we're going to see this patient I did have her come in with me after obtaining consent from the patient and her daughter in order to look at the wound and see if there's any recommendation one way or another as to whether or not they felt the snapback could be beneficial for the left lateral malleolus region. But BRISEIS, AGUILERA ([id number]) the conclusion was that it might be but that this is definitely a little bit deeper wound than what traditionally would be utilized for a snap vac. 09/06/18 on evaluation today patient actually appears to be doing excellent in my pinion in regard to both ankle ulcers. She has been tolerating the dressing changes without complication which is great news. Specifically we have been using the snap vac. In regard to the right ankle I'm not even sure that this is going to be necessary for today and following as the wound has filled in quite nicely. In regard to the left ankle I  do believe that we're seeing excellent epithelialization from the edge as well as granulation in the central portion the tendon is still exposed but there's no evidence of necrotic bone and in general I feel like the patient has made excellent progress even compared to last week with just one week of the snap vac. 09/11/18; this is a patient who has wounds on her bilateral lateral malleoli. Initially both of these were deep stage IV wounds in the setting of chronic arterial insufficiency. She has been revascularized. As I understand think she been using snap vacs to both of these wounds however the area on the right became more superficial and currently she is only using it on the left. Using silver collagen on the right and silver collagen under the back on the left I believe 09/19/18 on evaluation today patient actually appears to be doing very well in regard to her lateral malleolus or ulcers bilaterally. She has been tolerating the dressing changes without complication. Fortunately there does not appear to be any evidence of infection at this time. Overall I feel like she is improving in an excellent manner and I'm very pleased with the fact that everything seems to be turning towards the better for her. This has obviously been a long road. 09/27/18 on evaluation today patient actually appears to be doing very well in regard to her bilateral lateral malleolus ulcers. She has been tolerating the dressing changes without complication. Fortunately there does not appear to be any evidence of infection at this time which is also great news. No fevers, chills, nausea, or vomiting noted at this time. Overall  I feel like she is doing excellent with the snap vac on the left malleolus. She had 40 mL of fluid collection over the past week. 10/04/18 on evaluation today patient actually appears to be doing well in regard to her bilateral lateral malleolus ulcers. She continues to tolerate the dressing changes  without complication. One issue that I see is the snap vac on the left lateral malleolus which appears to have sealed off some fluid underlying this area and has not really allowed it to heal to the degree that I would like to see. For that reason I did suggest at this point we may want to pack a small piece of packing strip into this region to allow it to more effectively wick out fluid. 10/11/18 in general the patient today does not feel that she has been doing very well. She's been a little bit lethargic and subsequently is having bodyaches as well according to what she tells me today. With that being said overall she has been concerned with the fact that something may be worsening although to be honest her wounds really have not been appearing poorly. She does have a new ulcer on her left heel unfortunately. This may be pressure related. Nonetheless it seems to me to have potentially started at least as a blister I do not see any evidence of deep tissue injury. In regard to the left ankle the snap vac still seems to be causing the ceiling off of the deeper part of the wound which is in turn trapping fluid. I'm not extremely pleased with the overall appearance as far as progress from last week to this week therefore I'm gonna discontinue the snap vac at this point. 10/18/18 patient unfortunately this point has not been feeling well for the past several days. She was seen by Grayland Ormond her primary care provider who is a Librarian, academic at [facility]. Subsequently she states that she's been very weak and generally feeling malaise. No fevers, chills, nausea, or vomiting noted at this time. With that being said bloodwork was performed at the PCP office on the 11th of this month which showed a white blood cell count of 10.7. This was repeated today and shows a white blood cell count of 12.4. This does show signs of worsening. Coupled with the fact that she is feeling worse and that her left  ankle wound is not really showing signs of improvement I feel like this is an indication that the osteomyelitis is likely exacerbating not improving. Overall I think we may also want to check her C-reactive protein and sedimentation rate. Actually did call [facility] office this afternoon while the patient was in the office here with me. Subsequently based on the findings we discussed treatment possibilities and I think that it is appropriate for [place] to go ahead and initiate treatment with doxycycline which I'm going to do. Subsequently he did agree to see about adding a CRP and sedimentation rate to her orders. If that has not already been drawn to where they can run it they will contact the patient she can come back to have that check. They are in agreement with plan as far as the patient and her daughter are concerned. Nonetheless also think we need to get in touch with Dr. Henreitta Leber office to see about getting the patient scheduled with him as soon as possible. 11/08/18 on evaluation today patient presents for follow-up concerning her bilateral foot and ankle ulcers. I did do an extensive review of  her chart in epic today. Subsequently she was seen by [name] he did initiate Cefepime IV antibiotic therapy. Subsequently she had some issues with her PICC line this had to be removed because it was coiled and then replaced. Fortunately that was now settled. Unfortunately she has continued have issues with her left heel as well as the issues that she is experiencing with her bilateral lateral malleolus regions. I do believe however both areas seem to be doing a little bit better on evaluation today which is good news. No fevers, chills, nausea, or vomiting noted at this time. She actually has an angiogram schedule with Dr. dew on this coming Monday, November 11, 2018. Subsequently the patient states that she is feeling much better especially than what she was roughly 2 weeks ago. She actually had to  cancel an appointment because she was feeling so poorly. No fevers, chills, nausea, or vomiting noted at this time. 11/15/18 on evaluation today patient actually is status post having had her angiogram with Dr. dew Monday, four days ago. 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 He had to go and work on several areas of the vasculature fortunately he was able to obtain no more than a 30% residual stenosis throughout post procedure. I reviewed this note today. I think this will definitely help with healing at this time. Fortunately there does not appear to be any signs of infection and I do feel like ratio already has a better appearance to it. 11/22/18 upon evaluation today patient actually appears to be doing very well in regard to her wounds in general. The right lateral malleolus looks excellent the heel looks better in the left lateral malleolus also appears to be doing a little better. With that being said the right second toe actually appears to be open and training we been watching this is been dry and stable but now is open. 12/03/2018 Seen today for follow-up and management of multiple bilateral lower extremity wounds. New pressure injury of the great toe which is closed at this time. Wound of the right distal second toe appears larger today with deep undermining and a pocket of fluid present within the undermining region. Left and right malleolus is wounds are stable today with no signs and symptoms of infection.Denies any needs or concerns during exam today. 12/13/18 on evaluation today patient appears to be doing somewhat better in regard to her left heel ulcer. She also seems to be completely healed in regard to the right lateral malleolus ulcer. The left malleolus ulcer is smaller what unfortunately the wounds which are new over the first and second toes of the right foot are what are most concerning at this point especially the second. Both areas did require  sharp debridement today. 12/20/18 on evaluation today patient's wound actually appears to be doing better in regard to left lateral ankle and her right lateral ankle continues to remain healed. The hill ulcer on the left is improved. She does have improvement noted as well in regard to both toe ulcers. Overall I'm very pleased in this regard. No fevers, chills, nausea, or vomiting noted at this time. KAMILLA, HANDS ([id number]) 12/23/18 on evaluation today patient is seen after she had her toenails trimmed at the podiatrist office due to issues with her right great toe. There was what appeared to be dark eschar on the surface of the wound which had her in the podiatrist concerned. Nonetheless as I remember that during  the last office visit I had utilize silver nitrate of this area I was much less concerned about the situation. Subsequently I was able to clean off much of this tissue without any complication today. This does not appear to show any signs of infection and actually look somewhat better compared to last time post debridement. Her second toe on the right foot actually had callous over and there did appear still be some fluid underneath this that would require debridement today. 12/27/18 on evaluation today patient actually appears to be showing signs of improvement at all locations. Even the left lateral ankle although this is not quite as great as the other sites. Fortunately there does not appear to be any signs of infection at this time and both of her toes on the right foot seem to be showing signs of improvement which is good news and very pleased in this regard. 01/03/19 on evaluation today patient appears to be doing better for the most part in regard to her wounds in particular. There does not appear to be any evidence of infection at this time which is good news. Fortunately there is no sign of really worsening anywhere except for the right great toe which she does have what  appears to be a bruise/deep tissue injury which is very superficial and already resolving. I'm not sure where this came from I questioned her extensively and she does not recall what may have happened with this. Other than that the patient seems to be doing well even the left lateral ankle ulcer looks good and is getting smaller. 01/10/19 on evaluation today patient appears to be doing well in regard to her left heel wound and both of her toe wounds. Overall I feel like there is definitely improvement here and I'm happy in that regard. With that being said unfortunately she is having issues with the left lateral malleolus ulcer which unfortunately still has a lot of depth to it. This is gonna be a very difficult wound for [place] to be able to truly get to heal. I may want to consider some type of skin substitute to see if this would be of benefit for her. I'll discuss this with her more the next visit most likely. This was something I thought about more at the end of the visit when I was Artie out of the room and the patient had been discharged. 01/17/19 on evaluation today patient appears to be doing very well in regard to her wounds in general. She's been making excellent progress at this time. Fortunately there's no sign of infection at this time either. No fevers, chills, nausea, or vomiting noted at this time. The biggest issue is still her left lateral malleolus where it appears to be doing well and is getting smaller but still shows a small corner where this is deeper and goes down into what appears to be the joint space. Nonetheless this is taking much longer to heal although it still looks better in smaller than previous evaluations. 01/24/19 on evaluation today patient's wounds actually appear to be doing rather well in general overall. She did require some sharp debridement in regard to the right great toe but everything else appears to be doing excellent no debridement was even necessary. No  fevers, chills, nausea, or vomiting noted at this time. 01/31/19 on evaluation today patient actually appears to be doing much better in regard to her left foot wound on the heel as well as the ankle. The right great toe appears  to be a little bit worse today this had callous over and trapped a lot of fluid underneath. Fortunately there's no signs of infection at any site which is great news. 02/07/19 on evaluation today patient actually appears to be doing decently well in regard to all of her ulcers at this point. No sharp debridement was required she is a little bit of hyper granulation in regard to the left lateral ankle as well as the left heel but the hill itself is almost completely healed which is excellent news. Overall been very pleased in this regard. 02/14/19 on evaluation today patient actually appears to be doing very well in regard to her ulcers on the right first toe, left lateral malleolus, and left heel. In fact the heel is almost completely healed at this point. The patient does not show any signs of infection which is good news. Overall very pleased with how things have progressed. 04/18/19 Telehealth Evaluation During the COVID-19 National Emergency: Verbal Consent: Obtained from patient Allergies: reviewed and the active list is current. Medication changes: patient has no current medication changes. COVID-19 Screening: 1. Have you traveled internationally or on a cruise ship in the last 14 dayso No 2. Have you had contact with someone with or under investigation for COVID-19o No 3. Have you had a fever, cough, sore throat, or experiencing shortness of breatho No on evaluation today actually did have a visit with this patient through a telehealth encounter with her home health nurse. Subsequently it was noted that the patient actually appears to be doing okay in regard to her wounds both the right great toe as well as the left lateral malleolus have shown signs of improvement  although this in your theme around the left lateral malleolus there eschar coverings for both locations. The question is whether or not they are actually close and whether or not home health needs to discharge the patient or not. Nonetheless my concern is this point obviously is that without actually seeing her and being able to evaluate this directly I cannot ensure that she is completely healed which is the question that I'm being asked. 04/22/19 on evaluation today patient presents for her first evaluation since last time I saw her which was actually February 14, 2019. I did do a telehealth visit last week in which point it was questionable whether or not she may be healed and had to bring her in today for confirmation. With that being said she does seem to be doing quite well at this point which is good news. There does not appear to be any drainage in the deed I believe her wounds may be healed. Readmission: 09/04/2019 on evaluation today patient appears to be doing unfortunately somewhat more poorly in regard to her left foot ulcer secondary to a wound that began on 08/21/2019 at least when she first noticed this. Fortunately she has not had any evidence of active infection at this time. Systemically. I also do not necessarily see any evidence of infection at the blister/wound site on the first metatarsal head plantar aspect. This almost appears to be something that may have just rubbed inappropriately causing this to breakdown. They did not want a wait too long to come in to be seen as again she had significant issues in the past with wounds that took quite a while to heal in fact it was close to 2 years. Nonetheless this does not appear to be quite that bad but again we do need to remove some of  the necrotic tissue from the surface of the wound to tell exactly the extent. She does not appear to have any significant arterial disease at this point and again her last ABIs and TBI's are recorded above  in the alert section her left ABI was 1.27 with a TBI of 0.72 to the right ABI 1.08 with a TBI of 0.39. Other than this the patient has been doing quite ALMARIE, KURDZIEL ([id number]) well since I last saw her and that was in May 2020. 09/11/2019 on evaluation today patient appeared to be doing very well with regard to her plantar foot ulcer on the left. In fact this appears to be almost completely healed which is awesome. That is after just 1 week of intervention. With that being said there is no signs of active infection at this time. 09/18/2019 on evaluation today patient actually appears to be doing excellent in fact she is completely healed based on what I am seeing at this point. Fortunately there is no signs of active infection at this time and overall patient is very pleased to hear that this area has healed so quickly. Readmission: 05/13/2021 upon evaluation today this patient presents for reevaluation here in the clinic. This is a wound that actually we previously took care of. She had 1 on the right ankle and the left the left turned out to be be harder due to to heal but nonetheless is doing great at this point as the right that has reopened and it was noted first just several weeks ago with a scab over it and came off in just the past few days. Fortunately there does not appear to be any obvious evidence of significant active infection at this time which is great news. No fevers, chills, nausea, vomiting, or diarrhea. 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 There does not appear to be any signs of this interfering in any way with her wound. She does have swelling we previously had compression socks for her ordered but again it does not look like she wears these on a regular basis by any means. 05/26/2021 upon evaluation today patient appears to be doing well with regard to her wound which is actually showing signs of  excellent improvement. There does not appear to be any signs of active infection which is great news and overall very pleased with where things stand today. No fevers, chills, nausea, vomiting, or diarrhea. 06/02/2021 upon evaluation today patient's wound actually showing signs of excellent improvement. Fortunately there does not appear to be any signs of active infection which is great news. I think the patient is making good progress with regard to her wounds in general. 06/09/2021 upon evaluation today patient appears to be doing excellent in regard to her wounds currently. Fortunately there does not appear to be any signs of active infection which is great news. No fevers, chills, nausea, vomiting, or diarrhea. Overall extremely pleased with where things stand today. I think the patient is making excellent progress. 06/16/2021 upon evaluation today patient appears to be doing well in regard to her wound. This is going require little bit of debridement today and that was discussed with the patient. Otherwise she seems to be doing quite well and I am actually very pleased with where things stand at this point. No fevers, chills, nausea, vomiting, or diarrhea. 06/23/2021 upon evaluation today patient appears to be doing well with regard to her wounds. She has been tolerating the  dressing changes without complication. Fortunately there does not appear to be any evidence of infection and she has not had air in her home which she actually lives at an assisted living that got fixed this morning. With that being said because of that her wrap has been extremely hot and bothersome for her over the past week. 06/30/2021 upon evaluation today patient is actually making excellent progress in regard to her ankle ulcer. She has been tolerating the dressing changes without complication and overall extremely pleased with where things stand there does not appear to be any evidence of active infection which is great  news. No fevers, chills, nausea, vomiting, or diarrhea. 07/07/21 upon evaluation today patients and culture on the right actually appears to be doing quite well. There does not appear to be any signs of infection and overall very pleased with where things stand today. No fevers, chills, nausea, or vomiting noted at this time. 07/14/2021 unfortunately the patient today has some evidence of deep tissue injury and pressure getting to the ankle region. Again I am not exactly sure what is going on here but this is very similar to issues that we have had in the past. I explained to the patient that she needs to be very mindful of exactly what is happening I think sleeping in bed is probably the main issue here although there could be other culprits I am not sure what else would potentially lead to this kind of a problem for her. 07/21/2021 upon evaluation today patient's wound actually showing signs of improvement compared to last week. Fortunately there does not appear to be any signs of active infection which is great news and overall very pleased with where things stand in that regard. With that being said I do believe that she is continuing to show signs of overall of getting better although I think this is still basically about what we were 2 weeks ago due to the worsening and now improvement. 07/28/2021 upon evaluation today patient appears to be doing well with regard to her wound. She does have some slough buildup on the surface of the wound which I would have to manage today. Fortunately there is no sign of active infection at this time. No fevers, chills, nausea, vomiting, or diarrhea. 08/04/2021 upon evaluation today patient appears to be doing about the same in regard to her wound. To be perfectly honest I am beginning to be a little bit concerned about the overall appearance of the wound bed. I do think possibly taking a sample right around the margin of the wound could be beneficial for her as far  as identifying anything such as an inflammatory process or to be honest even a skin tag cancer type process that may be of concern here. Fortunately there does not appear to be any evidence of active infection at this time which is great news she is not having any pain also great news. 08/11/2021 upon evaluation today patient appears to be doing well with regard to her wound. The good news is I did review her biopsy results and it showed some inflammatory mixed findings but nothing that appeared to be malignant which is great news. Overall this is more of a chronic venous stasis type issue which again is more what we have been treating. Nonetheless I just wanted to make sure before going forward that there was not anything more untoward going on at this point. 08/18/2021 upon evaluation today patient appears to be doing well with regard to  her ankle ulcer. Fortunately there does not appear to be any signs of active infection at this time which is great overall wound is dramatically improved compared to last week. Since last week I have actually placed her on doxycycline and subsequently this is a good option as far as the findings are concerned at this point. 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 the doxycycline is doing that. There does not appear to be any evidence of active infection systemically which is great news. 08/25/2021 upon evaluation today patient appears to be doing well with regard to her wound. I feel like we are finally get back on track as far as healing is concerned I am much happier with the overall appearance today. I do think that she is tolerating the dressing changes without complication which is great news. We have been using Hydrofera Blue which I think is a good option. The good news is she is also doing great in [place], DANELL VAZQUEZ.  ([id number]) regard to her compression sock on the left which is a zipper compression that seems to be doing a great job keeping her edema under good control. 09/01/2021 upon evaluation today patient appears to be doing well with regard to her wound. Infection seems to be under much better control which is great news and very pleased in that regard. Fortunately there does not appear to be any signs of infection currently. 09/08/2021 upon evaluation today patient actually appears to be making good progress in regard to her wound. She has been tolerating the dressing changes without complication. Fortunately there does not appear to be any evidence of active infection at this time which is great news as well. No fevers, chills, nausea, vomiting, or diarrhea. 09/22/2021 upon evaluation today patient appears to be doing well with regard to her wound although is very slow to heal. We have not looked into Apligraf yet I think that is something that we should see about doing. 09/29/2021 upon evaluation today patient's wound is actually showing signs of doing about the same. I am not seeing any evidence of worsening but also no significant evidence of improvement. We did gain approval for the organogenesis products all except for Apligraf as covered by her insurance. With that being said I do think that we can go ahead and proceed with the NuShield if the patient and her family in agreement of the plan I discussed that with him today she does have a 20% coinsurance which we also discussed. 10/06/2021 upon evaluation today patient appears to unfortunately be doing a little bit worse she appears to be infected based on what I am seeing. Fortunately there does not appear to be any signs of active infection at this time which is great news. Unfortunately it does appear to be some evidence of around the wound edge indicated by way of erythema and warmth as well as redness 10/13/2021 upon evaluation today patient  actually appears to be doing excellent in regard to her ankle ulcer compared to what it was. 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 I think were fine to go ahead and apply the NuShield today. 10/20/2021 upon evaluation today patient  appears to be doing excellent in fact the NuShield seems to have done all some for her thus far. I am actually very pleased with where we stand and overall I think that she is making great progress. There is no evidence of active infection at this time. 11/23; patient presents for follow-up. She has no issues or complaints today. She denies signs of infection. She reports stability in her wound healing. 11/03/2021 upon evaluation today patient appears to be doing well with regard to her wounds. She has been tolerating the dressing changes without complication. Fortunately there is no signs of active infection at this time. 11/10/2021 upon evaluation today patient appears to be doing well with regard to her right ankle which is actually showing signs of improvement with the NuShield I am very pleased. Subsequently in regards to the left ankle this appears to be doing okay with no evidence of issue here either. With that being said she has a significant contusion on the left leg from having fallen 2 days ago. She tells me that she was using her walker going to the closet and then when she got to the closet turned around to get something out at which point she fell according to the story. Nonetheless she does have a hematoma just below her knee on the anterior portion of her shin. My hope is that this will not open until wound although we do need I think Some compression over it also think that we need to have her use an ice as well to help with the swelling and prevent this  from getting worse. The last thing she needs is a wound opened up here. 11/17/2021 upon evaluation patient's right ankle actually showing signs of improvement based on what I see currently there is a lot of new skin growth coming in which is great news. Nonetheless I do feel like that the patient is showing improvement as well in regard to her left leg with the use of the Tubigrip it swollen but not as bruised as what I would have expected after what I was seeing last week. Nonetheless I do think doubling up on the Tubigrip would probably be beneficial to try to keep some of the edema under better control here. 11/24/2021 upon evaluation today patient appears to be doing excellent in regard to her wound. This is actually measuring significantly smaller which is great news. Fortunately I do not see any signs of active infection locally nor systemically at this point. Overall I am very pleased with how the NuShield is doing. 12/30; wound bed looks healthy however not much change in overall wound volume. We applied Nushield again today in the standard fashion 12/08/2021 upon evaluation today patient's wound actually showing signs of good improvement and I am actually very pleased with where we stand today as well. I do not see any evidence of active infection locally nor systemically which is great news. Unfortunately she has been having some issues with stomach upset and diarrhea today. 12/15/2021 upon evaluation today patient appears to be doing excellent in regard to her wound. There is a little bit of dry skin raised up around the edges of the wound but fortunately nothing too significant at this point. Fortunately I do not see any evidence of active infection either which is great news. No fevers, chills, nausea, vomiting, or diarrhea. 12/29/2021 upon evaluation today patient appears to be doing well with regard to her wound. In general I feel like she is getting very close to  complete resolution. I  think that she is making good progress here as well. Overall I do not see any signs of infection and very little of this appears to actually be open. Objective Constitutional Skylene, Deremer [name] ([id number]) Well-nourished and well-hydrated in no acute distress. Vitals Time Taken: 10:09 AM, Height: 62 in, Weight: 150 lbs, BMI: 27.4, Temperature: 98.4 F, Pulse: 92 bpm, Respiratory Rate: 18 breaths/min, Blood Pressure: 166/81 mmHg. Respiratory normal breathing without difficulty. Psychiatric this patient is able to make decisions and demonstrates good insight into disease process. Alert and Oriented x 3. pleasant and cooperative. General Notes: Upon inspection patient's wound bed showed signs of good granulation and epithelization at this point. Fortunately I do not see any evidence of much that is open there is a rim around the edges and there is an indention but there appears to be mostly new skin there is just a couple small spots that are still remaining open otherwise I think this looks excellent. We have already biopsied this previous and there was no evidence of a carcinoma. I think this is just scar tissue from the way the wound has healed. Integumentary (Hair, Skin) Wound #7 status is Open. Original cause of wound was Gradually Appeared. The date acquired was: 05/12/2021. The wound has been in treatment 32 weeks. The wound is located on the Right,Lateral Malleolus. The wound measures 0.5cm length x 0.5cm width x 0.2cm depth; 0.196cm^2 area and 0.039cm^3 volume. There is Fat Layer (Subcutaneous Tissue) exposed. There is no tunneling or undermining noted. There is a medium amount of serosanguineous drainage noted. There is medium (34-66%) red granulation within the wound bed. There is a medium (34-66%) amount of necrotic tissue within the wound bed. Assessment Active Problems ICD-10 Pressure ulcer of right ankle, stage 3 Lymphedema, not elsewhere classified Venous insufficiency  (chronic) (peripheral) Presence of cardiac pacemaker Long term (current) use of anticoagulants Procedures Wound #7 Pre-procedure diagnosis of Wound #7 is a Pressure Ulcer located on the Right,Lateral Malleolus . There was a Excisional Skin/Subcutaneous Tissue Debridement with a total area of 1 sq cm performed by Tommie Sams., PA-C. With the following instrument(s): Curette to remove Viable and Non-Viable tissue/material. Material removed includes Callus and Subcutaneous Tissue and after achieving pain control using Lidocaine 4% Topical Solution. No specimens were taken. A time out was conducted at 10:26, prior to the start of the procedure. A Minimum amount of bleeding was controlled with Pressure. The procedure was tolerated well with a pain level of 0 throughout and a pain level of 0 following the procedure. Post Debridement Measurements: 0.5cm length x 0.5cm width x 0.2cm depth; 0.039cm^3 volume. Post debridement Stage noted as Category/Stage III. Character of Wound/Ulcer Post Debridement is improved. Post procedure Diagnosis Wound #7: Same as Pre-Procedure Plan Follow-up Appointments: Return Appointment in 1 week. Bathing/ Shower/ Hygiene: May shower with wound dressing protected with water repellent cover or cast protector. Edema Control - Lymphedema / Segmental Compressive Device / Other: Optional: One layer of unna paste to top of compression wrap (to act as an anchor). - and at toes Patient to wear own compression stockings. Remove compression stockings every night before going to bed and put on every morning when getting up. - left leg Elevate, Exercise Daily and Avoid Standing for Long Periods of Time. Elevate legs to the level of the heart and pump ankles as often as possible Elevate leg(s) parallel to the floor when sitting. LAILYN, APPELBAUM ([id number]) WOUND #7: - Malleolus Wound Laterality: Right, Lateral  Cleanser: Soap and Water 1 x Per Week/30 Days Discharge  Instructions: Gently cleanse wound with antibacterial soap, rinse and pat dry prior to dressing wounds Primary Dressing: Prisma 4.34 (in) 1 x Per Week/30 Days Discharge Instructions: Moisten w/normal saline or sterile water; Cover wound as directed. Do not remove from wound bed. Secondary Dressing: Gauze 1 x Per Week/30 Days Discharge Instructions: As directed: dry, moistened with saline or moistened with Dakins Solution Compression Wrap: Profore Lite LF 3 Multilayer Compression Bandaging System 1 x Per Week/30 Days Discharge Instructions: Apply 3 multi-layer wrap as prescribed. 1. Would recommend currently that we going to continue with the wound care measures as before and the patient is in agreement with the plan this includes the use of the 3 layer compression wrap which I think is doing a great job. 2. We will switch to a plain alginate dressing just to ensure that there is no drainage that collects in this area causing any trouble otherwise I believe that she is definitely doing quite well based on what I see today I am hopeful she will be healed when I see her in 2 weeks next week will be a nurse visit. We will see patient back for reevaluation in 1 week here in the clinic. If anything worsens or changes patient will contact our office for additional recommendations. Electronic Signature(s) Signed: 12/29/2021 10:42:13 AM By: Worthy Keeler PA-C Entered By: Worthy Keeler on 12/29/2021 10:42:13 Smaldone, [name] ([id number]) -------------------------------------------------------------------------------- SuperBill Details Patient Name: [name] Date of Service: 12/29/2021 Medical Record Number: [id number] Patient Account Number: [id number] Date of Birth/Sex: May 16, 1923 (88 y.o. F) Treating RN: Carlene Coria Primary Care Provider: Adrian Prows Other Clinician: Referring Provider: Adrian Prows Treating Provider/Extender: Skipper Cliche in Treatment: 32 Diagnosis  Coding ICD-10 Codes Code Description [phone] Pressure ulcer of right ankle, stage 3 I89.0 Lymphedema, not elsewhere classified I87.2 Venous insufficiency (chronic) (peripheral) Z95.0 Presence of cardiac pacemaker Z79.01 Long term (current) use of anticoagulants Facility Procedures CPT4 Code: 37290211 Description: 15520 - DEB SUBQ TISSUE 20 SQ CM/< Modifier: Quantity: 1 CPT4 Code: Description: ICD-10 Diagnosis Description 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, stage 3 Modifier: Quantity: Physician Procedures CPT4 Code: [id number] Description: 12244 - WC PHYS SUBQ TISS 20 SQ CM Modifier: Quantity: 1 CPT4 Code: Description: ICD-10 Diagnosis Description 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, stage 3 Modifier: Quantity: Electronic Signature(s) Signed: 12/29/2021 10:42:37 AM By: Worthy Keeler PA-C Entered By: Worthy Keeler on 12/29/2021 10:42:37

## 2021-12-30 NOTE — Progress Notes (Signed)
TARRIN, MENN ([id number]) Visit Report for 12/29/2021 Arrival Information Details Patient Name: STACI, DACK. Date of Service: 12/29/2021 10:15 AM Medical Record Number: [id number] Patient Account Number: [id number] Date of Birth/Sex: 1923-06-07 (88 y.o. F) Treating RN: Carlene Coria Primary Care Erna Brossard: Adrian Prows Other Clinician: Referring Roselyne Stalnaker: Adrian Prows Treating Cassity Christian/Extender: Skipper Cliche in Treatment: 59 Visit Information History Since Last Visit All ordered tests and consults were completed: No Patient Arrived: [name] Added or deleted any medications: No Arrival Time: 10:05 Any new allergies or adverse reactions: No Accompanied By: daughter Had a fall or experienced change in No Transfer Assistance: None activities of daily living that may affect Patient Identification Verified: Yes risk of falls: Secondary Verification Process Completed: Yes Signs or symptoms of abuse/neglect since last visito No Patient Requires Transmission-Based No Hospitalized since last visit: No Precautions: Implantable device outside of the clinic excluding No Patient Has Alerts: Yes cellular tissue based products placed in the center Patient Alerts: Patient on Blood since last visit: Thinner Has Dressing in Place as Prescribed: Yes NOT DIABETIC Has Compression in Place as Prescribed: Yes ***ELIQUIS*** Pain Present Now: No Electronic Signature(s) Signed: 12/30/2021 2:31:10 PM By: Carlene Coria RN Entered By: Carlene Coria on 12/29/2021 10:09:43 Lietzke, [name] ([id number]) -------------------------------------------------------------------------------- Clinic Level of Care Assessment Details Patient Name: [name] Date of Service: 12/29/2021 10:15 AM Medical Record Number: [id number] Patient Account Number: [id number] Date of Birth/Sex: December 13, 1922 (88 y.o. F) Treating RN: Carlene Coria Primary Care Jackelyne Sayer: Adrian Prows Other  Clinician: Referring Adair Lauderback: Adrian Prows Treating Kelse Ploch/Extender: Skipper Cliche in Treatment: 32 Clinic Level of Care Assessment Items TOOL 4 Quantity Score []  - Use when only an EandM is performed on FOLLOW-UP visit 0 ASSESSMENTS - Nursing Assessment / Reassessment []  - Reassessment of Co-morbidities (includes updates in patient status) 0 []  - 0 Reassessment of Adherence to Treatment Plan ASSESSMENTS - Wound and Skin Assessment / Reassessment []  - Simple Wound Assessment / Reassessment - one wound 0 []  - 0 Complex Wound Assessment / Reassessment - multiple wounds []  - 0 Dermatologic / Skin Assessment (not related to wound area) ASSESSMENTS - Focused Assessment []  - Circumferential Edema Measurements - multi extremities 0 []  - 0 Nutritional Assessment / Counseling / Intervention []  - 0 Lower Extremity Assessment (monofilament, tuning fork, pulses) []  - 0 Peripheral Arterial Disease Assessment (using hand held doppler) ASSESSMENTS - Ostomy and/or Continence Assessment and Care []  - Incontinence Assessment and Management 0 []  - 0 Ostomy Care Assessment and Management (repouching, etc.) PROCESS - Coordination of Care []  - Simple Patient / Family Education for ongoing care 0 []  - 0 Complex (extensive) Patient / Family Education for ongoing care []  - 0 Staff obtains Programmer, systems, Records, Test Results / Process Orders []  - 0 Staff telephones HHA, Nursing Homes / Clarify orders / etc []  - 0 Routine Transfer to another Facility (non-emergent condition) []  - 0 Routine Hospital Admission (non-emergent condition) []  - 0 New Admissions / Biomedical engineer / Ordering NPWT, Apligraf, etc. []  - 0 Emergency Hospital Admission (emergent condition) []  - 0 Simple Discharge Coordination []  - 0 Complex (extensive) Discharge Coordination PROCESS - Special Needs []  - Pediatric / Minor Patient Management 0 []  - 0 Isolation Patient Management []  - 0 Hearing / Language  / Visual special needs []  - 0 Assessment of Community assistance (transportation, D/C planning, etc.) []  - 0 Additional assistance / Altered mentation []  - 0 Support Surface(s) Assessment (bed, cushion, seat, etc.) INTERVENTIONS - Wound Cleansing /  Measurement Debria, Broecker [name] ([id number]) []  - 0 Simple Wound Cleansing - one wound []  - 0 Complex Wound Cleansing - multiple wounds []  - 0 Wound Imaging (photographs - any number of wounds) []  - 0 Wound Tracing (instead of photographs) []  - 0 Simple Wound Measurement - one wound []  - 0 Complex Wound Measurement - multiple wounds INTERVENTIONS - Wound Dressings []  - Small Wound Dressing one or multiple wounds 0 []  - 0 Medium Wound Dressing one or multiple wounds []  - 0 Large Wound Dressing one or multiple wounds []  - 0 Application of Medications - topical []  - 0 Application of Medications - injection INTERVENTIONS - Miscellaneous []  - External ear exam 0 []  - 0 Specimen Collection (cultures, biopsies, blood, body fluids, etc.) []  - 0 Specimen(s) / Culture(s) sent or taken to Lab for analysis []  - 0 Patient Transfer (multiple staff / Civil Service fast streamer / Similar devices) []  - 0 Simple Staple / Suture removal (25 or less) []  - 0 Complex Staple / Suture removal (26 or more) []  - 0 Hypo / Hyperglycemic Management (close monitor of Blood Glucose) []  - 0 Ankle / Brachial Index (ABI) - do not check if billed separately []  - 0 Vital Signs Has the patient been seen at the hospital within the last three years: Yes Total Score: 0 Level Of Care: ____ Electronic Signature(s) Signed: 12/30/2021 2:31:10 PM By: Carlene Coria RN Entered By: Carlene Coria on 12/29/2021 10:31:25 Dudding, [name] ([id number]) -------------------------------------------------------------------------------- Encounter Discharge Information Details Patient Name: [name] Date of Service: 12/29/2021 10:15 AM Medical Record Number: [id number] Patient  Account Number: [id number] Date of Birth/Sex: 01/24/1923 (88 y.o. F) Treating RN: Carlene Coria Primary Care Kaori Jumper: Adrian Prows Other Clinician: Referring Deserie Dirks: Adrian Prows Treating Kali Deadwyler/Extender: Skipper Cliche in Treatment: 40 Encounter Discharge Information Items Post Procedure Vitals Discharge Condition: Stable Temperature (F): 98.4 Ambulatory Status: Walker Pulse (bpm): 92 Discharge Destination: Home Respiratory Rate (breaths/min): 18 Transportation: Private Auto Blood Pressure (mmHg): 166/81 Accompanied By: self Schedule Follow-up Appointment: Yes Clinical Summary of Care: Patient Declined Electronic Signature(s) Signed: 12/30/2021 2:31:10 PM By: Carlene Coria RN Entered By: Carlene Coria on 12/29/2021 10:46:10 Crego, [name] ([id number]) -------------------------------------------------------------------------------- Lower Extremity Assessment Details Patient Name: [name] Date of Service: 12/29/2021 10:15 AM Medical Record Number: [id number] Patient Account Number: [id number] Date of Birth/Sex: [birth year] (88 y.o. F) Treating RN: Carlene Coria Primary Care Abdifatah Colquhoun: Adrian Prows Other Clinician: Referring Joeph Szatkowski: Adrian Prows Treating Leory Allinson/Extender: Skipper Cliche in Treatment: 32 Edema Assessment Assessed: [Left: No] [Right: No] Edema: [Left: N] [Right: o] Calf Left: Right: Point of Measurement: 33 cm From Medial Instep 27 cm Ankle Left: Right: Point of Measurement: 10 cm From Medial Instep 18 cm Vascular Assessment Pulses: Dorsalis Pedis Palpable: [Right:Yes] Electronic Signature(s) Signed: 12/30/2021 2:31:10 PM By: Carlene Coria RN Entered By: Carlene Coria on 12/29/2021 10:21:27 Vigil, [name] ([id number]) -------------------------------------------------------------------------------- Multi Wound Chart Details Patient Name: [name] Date of Service: 12/29/2021 10:15 AM Medical Record  Number: [id number] Patient Account Number: [id number] Date of Birth/Sex: Feb 21, 1923 (88 y.o. F) Treating RN: Carlene Coria Primary Care Shyana Kulakowski: Adrian Prows Other Clinician: Referring Edan Serratore: Adrian Prows Treating Eian Vandervelden/Extender: Skipper Cliche in Treatment: 32 Vital Signs Height(in): 34 Pulse(bpm): 26 Weight(lbs): 150 Blood Pressure(mmHg): 166/81 Body Mass Index(BMI): 27.4 Temperature(F): 98.4 Respiratory Rate(breaths/min): 18 Photos: [N/A:N/A] Wound Location: Right, Lateral Malleolus N/A N/A Wounding Event: Gradually Appeared N/A N/A Primary Etiology: Pressure Ulcer N/A N/A Comorbid History: Cataracts, Congestive Heart Failure, N/A N/A Hypertension, Peripheral  Arterial Disease, Osteoarthritis, Neuropathy Date Acquired: 05/12/2021 N/A N/A Weeks of Treatment: 32 N/A N/A Wound Status: Open N/A N/A Wound Recurrence: No N/A N/A Measurements L x W x D (cm) 0.5x0.5x0.2 N/A N/A Area (cm) : 0.196 N/A N/A Volume (cm) : 0.039 N/A N/A % Reduction in Area: 82.50% N/A N/A % Reduction in Volume: 82.70% N/A N/A Classification: Category/Stage III N/A N/A Exudate Amount: Medium N/A N/A Exudate Type: Serosanguineous N/A N/A Exudate Color: red, brown N/A N/A Granulation Amount: Medium (34-66%) N/A N/A Granulation Quality: Red N/A N/A Necrotic Amount: Medium (34-66%) N/A N/A Exposed Structures: Fat Layer (Subcutaneous Tissue): N/A N/A Yes Fascia: No Tendon: No Muscle: No Joint: No Bone: No Epithelialization: None N/A N/A Treatment Notes Electronic Signature(s) Signed: 12/30/2021 2:31:10 PM By: Carlene Coria RN Entered By: Carlene Coria on 12/29/2021 10:24:23 Pilling, [name] ([id number]) -------------------------------------------------------------------------------- Multi-Disciplinary Care Plan Details Patient Name: [name] Date of Service: 12/29/2021 10:15 AM Medical Record Number: [id number] Patient Account Number: [id number] Date of Birth/Sex:  1923-04-18 (88 y.o. F) Treating RN: Carlene Coria Primary Care Greysin Medlen: Adrian Prows Other Clinician: Referring Issa Luster: Adrian Prows Treating Halei Hanover/Extender: Skipper Cliche in Treatment: 32 Active Inactive Electronic Signature(s) Signed: 12/30/2021 2:31:10 PM By: Carlene Coria RN Entered By: Carlene Coria on 12/29/2021 10:24:07 [name] ([id number]) -------------------------------------------------------------------------------- Pain Assessment Details Patient Name: [name] Date of Service: 12/29/2021 10:15 AM Medical Record Number: [id number] Patient Account Number: [id number] Date of Birth/Sex: 1923/05/11 (88 y.o. F) Treating RN: Carlene Coria Primary Care Gila Lauf: Adrian Prows Other Clinician: Referring Bexley Laubach: Adrian Prows Treating Jonahtan Manseau/Extender: Skipper Cliche in Treatment: 32 Active Problems Location of Pain Severity and Description of Pain Patient Has Paino No Site Locations Pain Management and Medication Current Pain Management: Electronic Signature(s) Signed: 12/30/2021 2:31:10 PM By: Carlene Coria RN Entered By: Carlene Coria on 12/29/2021 10:10:26 Nero, [name] ([id number]) -------------------------------------------------------------------------------- Patient/Caregiver Education Details Patient Name: [name] Date of Service: 12/29/2021 10:15 AM Medical Record Number: [id number] Patient Account Number: [id number] Date of Birth/Gender: [birth year] (88 y.o. F) Treating RN: Carlene Coria Primary Care Physician: Adrian Prows Other Clinician: Referring Physician: Adrian Prows Treating Physician/Extender: Skipper Cliche in Treatment: 78 Education Assessment Education Provided To: Patient Education Topics Provided Wound/Skin Impairment: Methods: Explain/Verbal Responses: State content correctly Electronic Signature(s) Signed: 12/30/2021 2:31:10 PM By: Carlene Coria RN Entered By: Carlene Coria on 12/29/2021 10:31:53 Mcbee, [name] ([id number]) -------------------------------------------------------------------------------- Wound Assessment Details Patient Name: [name] Date of Service: 12/29/2021 10:15 AM Medical Record Number: [id number] Patient Account Number: [id number] Date of Birth/Sex: 1923/05/13 (88 y.o. F) Treating RN: Carlene Coria Primary Care Antigone Crowell: Adrian Prows Other Clinician: Referring Oliviah Agostini: Adrian Prows Treating Floetta Brickey/Extender: Skipper Cliche in Treatment: 32 Wound Status Wound Number: 7 Primary Pressure Ulcer Etiology: Wound Location: Right, Lateral Malleolus Wound Open Wounding Event: Gradually Appeared Status: Date Acquired: 05/12/2021 Comorbid Cataracts, Congestive Heart Failure, Hypertension, Weeks Of Treatment: 32 History: Peripheral Arterial Disease, Osteoarthritis, Neuropathy Clustered Wound: No Photos Wound Measurements Length: (cm) 0.5 Width: (cm) 0.5 Depth: (cm) 0.2 Area: (cm) 0.196 Volume: (cm) 0.039 % Reduction in Area: 82.5% % Reduction in Volume: 82.7% Epithelialization: None Tunneling: No Undermining: No Wound Description Classification: Category/Stage III Exudate Amount: Medium Exudate Type: Serosanguineous Exudate Color: red, brown Foul Odor After Cleansing: No Slough/Fibrino Yes Wound Bed Granulation Amount: Medium (34-66%) Exposed Structure Granulation Quality: Red Fascia Exposed: No Necrotic Amount: Medium (34-66%) Fat Layer (Subcutaneous Tissue) Exposed: Yes Tendon Exposed: No Muscle Exposed: No Joint Exposed: No Bone Exposed: No  Treatment Notes Wound #7 (Malleolus) Wound Laterality: Right, Lateral Cleanser Soap and Water Discharge Instruction: Gently cleanse wound with antibacterial soap, rinse and pat dry prior to dressing wounds Peri-Wound Care Bensinger, [name] ([id number]) Topical Primary Dressing Prisma 4.34 (in) Discharge Instruction: Moisten w/normal  saline or sterile water; Cover wound as directed. Do not remove from wound bed. Secondary Dressing Gauze Discharge Instruction: As directed: dry, moistened with saline or moistened with Dakins Solution Secured With Compression Wrap Profore Lite LF 3 Multilayer Compression Bandaging System Discharge Instruction: Apply 3 multi-layer wrap as prescribed. Compression Stockings Add-Ons Electronic Signature(s) Signed: 12/30/2021 2:31:10 PM By: Carlene Coria RN Entered By: Carlene Coria on 12/29/2021 10:20:38 [name] ([id number]) -------------------------------------------------------------------------------- Vitals Details Patient Name: [name] Date of Service: 12/29/2021 10:15 AM Medical Record Number: [id number] Patient Account Number: [id number] Date of Birth/Sex: October 12, 1923 (88 y.o. F) Treating RN: Carlene Coria Primary Care Sajjad Honea: Adrian Prows Other Clinician: Referring Jorge Amparo: Adrian Prows Treating Emie Sommerfeld/Extender: Skipper Cliche in Treatment: 32 Vital Signs Time Taken: 10:09 Temperature (F): 98.4 Height (in): 62 Pulse (bpm): 92 Weight (lbs): 150 Respiratory Rate (breaths/min): 18 Body Mass Index (BMI): 27.4 Blood Pressure (mmHg): 166/81 Reference Range: 80 - 120 mg / dl Electronic Signature(s) Signed: 12/30/2021 2:31:10 PM By: Carlene Coria RN Entered By: Carlene Coria on 12/29/2021 10:10:10

## 2022-01-05 ENCOUNTER — Encounter: Payer: Medicare Other | Attending: Physician Assistant

## 2022-01-05 ENCOUNTER — Other Ambulatory Visit: Payer: Self-pay

## 2022-01-05 DIAGNOSIS — Z85828 Personal history of other malignant neoplasm of skin: Secondary | ICD-10-CM | POA: Insufficient documentation

## 2022-01-05 DIAGNOSIS — Z95820 Peripheral vascular angioplasty status with implants and grafts: Secondary | ICD-10-CM | POA: Insufficient documentation

## 2022-01-05 DIAGNOSIS — Z950 Presence of cardiac pacemaker: Secondary | ICD-10-CM | POA: Insufficient documentation

## 2022-01-05 DIAGNOSIS — I739 Peripheral vascular disease, unspecified: Secondary | ICD-10-CM | POA: Diagnosis not present

## 2022-01-05 DIAGNOSIS — I890 Lymphedema, not elsewhere classified: Secondary | ICD-10-CM | POA: Diagnosis not present

## 2022-01-05 DIAGNOSIS — I872 Venous insufficiency (chronic) (peripheral): Secondary | ICD-10-CM | POA: Insufficient documentation

## 2022-01-05 DIAGNOSIS — L89513 Pressure ulcer of right ankle, stage 3: Secondary | ICD-10-CM | POA: Diagnosis not present

## 2022-01-05 DIAGNOSIS — Z7901 Long term (current) use of anticoagulants: Secondary | ICD-10-CM | POA: Insufficient documentation

## 2022-01-05 NOTE — Progress Notes (Signed)
[name] ([id number]) Visit Report for 01/05/2022 Arrival Information Details Patient Name: [name]. Date of Service: 01/05/2022 10:00 AM Medical Record Number: [id number] Patient Account Number: [id number] Date of Birth/Sex: 01/08/1923 (88 y.o. F) Treating RN: Levora Dredge Primary Care Semaje Kinker: Adrian Prows Other Clinician: Referring Offie Pickron: Adrian Prows Treating Vandora Jaskulski/Extender: Skipper Cliche in Treatment: 52 Visit Information History Since Last Visit Added or deleted any medications: No Patient Arrived: Wheel Chair Any new allergies or adverse reactions: No Arrival Time: 10:02 Had a fall or experienced change in No Accompanied By: family activities of daily living that may affect Transfer Assistance: None risk of falls: Patient Identification Verified: Yes Hospitalized since last visit: No Secondary Verification Process Completed: Yes Has Dressing in Place as Prescribed: Yes Patient Requires Transmission-Based No Pain Present Now: No Precautions: Patient Has Alerts: Yes Patient Alerts: Patient on Blood Thinner NOT DIABETIC ***ELIQUIS*** Electronic Signature(s) Signed: 01/05/2022 11:24:41 AM By: Levora Dredge Entered By: Levora Dredge on 01/05/2022 10:05:23 Winther, [name] ([id number]) -------------------------------------------------------------------------------- Clinic Level of Care Assessment Details Patient Name: [name] Date of Service: 01/05/2022 10:00 AM Medical Record Number: [id number] Patient Account Number: [id number] Date of Birth/Sex: 24-Nov-1923 (88 y.o. F) Treating RN: Levora Dredge Primary Care Anarosa Kubisiak: Adrian Prows Other Clinician: Referring Kelley Knoth: Adrian Prows Treating Mykaylah Ballman/Extender: Skipper Cliche in Treatment: 33 Clinic Level of Care Assessment Items TOOL 1 Quantity Score []  - Use when EandM and Procedure is performed on INITIAL visit 0 ASSESSMENTS - Nursing Assessment /  Reassessment []  - General Physical Exam (combine w/ comprehensive assessment (listed just below) when performed on new 0 pt. evals) []  - 0 Comprehensive Assessment (HX, ROS, Risk Assessments, Wounds Hx, etc.) ASSESSMENTS - Wound and Skin Assessment / Reassessment []  - Dermatologic / Skin Assessment (not related to wound area) 0 ASSESSMENTS - Ostomy and/or Continence Assessment and Care []  - Incontinence Assessment and Management 0 []  - 0 Ostomy Care Assessment and Management (repouching, etc.) PROCESS - Coordination of Care []  - Simple Patient / Family Education for ongoing care 0 []  - 0 Complex (extensive) Patient / Family Education for ongoing care []  - 0 Staff obtains Programmer, systems, Records, Test Results / Process Orders []  - 0 Staff telephones HHA, Nursing Homes / Clarify orders / etc []  - 0 Routine Transfer to another Facility (non-emergent condition) []  - 0 Routine Hospital Admission (non-emergent condition) []  - 0 New Admissions / Biomedical engineer / Ordering NPWT, Apligraf, etc. []  - 0 Emergency Hospital Admission (emergent condition) PROCESS - Special Needs []  - Pediatric / Minor Patient Management 0 []  - 0 Isolation Patient Management []  - 0 Hearing / Language / Visual special needs []  - 0 Assessment of Community assistance (transportation, D/C planning, etc.) []  - 0 Additional assistance / Altered mentation []  - 0 Support Surface(s) Assessment (bed, cushion, seat, etc.) INTERVENTIONS - Miscellaneous []  - External ear exam 0 []  - 0 Patient Transfer (multiple staff / Civil Service fast streamer / Similar devices) []  - 0 Simple Staple / Suture removal (25 or less) []  - 0 Complex Staple / Suture removal (26 or more) []  - 0 Hypo/Hyperglycemic Management (do not check if billed separately) []  - 0 Ankle / Brachial Index (ABI) - do not check if billed separately Has the patient been seen at the hospital within the last three years: Yes Total Score: 0 Level Of Care:  ____ [name] ([id number]) Electronic Signature(s) Signed: 01/05/2022 11:24:41 AM By: Levora Dredge Entered By: Levora Dredge on 01/05/2022 10:35:35 Winthrop, [name] ([id number]) --------------------------------------------------------------------------------  Compression Therapy Details Patient Name: [name]. Date of Service: 01/05/2022 10:00 AM Medical Record Number: [id number] Patient Account Number: [id number] Date of Birth/Sex: [birth year] (88 y.o. F) Treating RN: Levora Dredge Primary Care Michal Callicott: Adrian Prows Other Clinician: Referring Juliauna Stueve: Adrian Prows Treating Tamesha Ellerbrock/Extender: Skipper Cliche in Treatment: 33 Compression Therapy Performed for Wound Assessment: Wound #7 Right,Lateral Malleolus Performed By: Annie Paras, RN Compression Type: Three Layer Notes pt tolerated wrap without issue Electronic Signature(s) Signed: 01/05/2022 11:24:41 AM By: Levora Dredge Entered By: Levora Dredge on 01/05/2022 10:32:54 Vanzile, [name] ([id number]) -------------------------------------------------------------------------------- Encounter Discharge Information Details Patient Name: [name] Date of Service: 01/05/2022 10:00 AM Medical Record Number: [id number] Patient Account Number: [id number] Date of Birth/Sex: 1923/01/17 (88 y.o. F) Treating RN: Levora Dredge Primary Care Liberta Gimpel: Adrian Prows Other Clinician: Referring Svea Pusch: Adrian Prows Treating Mirtie Bastyr/Extender: Skipper Cliche in Treatment: 41 Encounter Discharge Information Items Discharge Condition: Stable Ambulatory Status: Walker Discharge Destination: Home Transportation: Private Auto Accompanied By: family Schedule Follow-up Appointment: Yes Clinical Summary of Care: Electronic Signature(s) Signed: 01/05/2022 11:24:41 AM By: Levora Dredge Entered By: Levora Dredge on 01/05/2022 10:34:40 Boal, [name]  ([id number]) -------------------------------------------------------------------------------- Wound Assessment Details Patient Name: [name] Date of Service: 01/05/2022 10:00 AM Medical Record Number: [id number] Patient Account Number: [id number] Date of Birth/Sex: 20-Jun-1923 (88 y.o. F) Treating RN: Levora Dredge Primary Care Alayjah Boehringer: Adrian Prows Other Clinician: Referring [place] Doster: Adrian Prows Treating Sharne Linders/Extender: Skipper Cliche in Treatment: 33 Wound Status Wound Number: 7 Primary Pressure Ulcer Etiology: Wound Location: Right, Lateral Malleolus Wound Open Wounding Event: Gradually Appeared Status: Date Acquired: 05/12/2021 Comorbid Cataracts, Congestive Heart Failure, Hypertension, Weeks Of Treatment: 33 History: Peripheral Arterial Disease, Osteoarthritis, Neuropathy Clustered Wound: No Photos Wound Measurements Length: (cm) 0.5 % Red Width: (cm) 0.5 % Red Depth: (cm) 0.2 Epith Area: (cm) 0.196 Tunn Volume: (cm) 0.039 Unde uction in Area: 82.5% uction in Volume: 82.7% elialization: None eling: No rmining: No Wound Description Classification: Category/Stage III Foul Exudate Amount: Medium Slou Exudate Type: Serosanguineous Exudate Color: red, brown Odor After Cleansing: No gh/Fibrino Yes Wound Bed Granulation Amount: Medium (34-66%) Exposed Structure Granulation Quality: Red Fascia Exposed: No Necrotic Amount: Medium (34-66%) Fat Layer (Subcutaneous Tissue) Exposed: Yes Necrotic Quality: Adherent Slough Tendon Exposed: No Muscle Exposed: No Joint Exposed: No Bone Exposed: No Assessment Notes wound appears to be filling in Treatment Notes Wound #7 (Malleolus) Wound Laterality: Right, Lateral Cleanser Soap and Water Spiller, [name] ([id number]) Discharge Instruction: Gently cleanse wound with antibacterial soap, rinse and pat dry prior to dressing wounds Peri-Wound Care Topical Primary Dressing Prisma 4.34  (in) Discharge Instruction: Moisten w/normal saline or sterile water; Cover wound as directed. Do not remove from wound bed. Secondary Dressing Gauze Discharge Instruction: As directed: dry, moistened with saline or moistened with Dakins Solution Secured With Compression Wrap Profore Lite LF 3 Multilayer Compression Bandaging System Discharge Instruction: Apply 3 multi-layer wrap as prescribed. Compression Stockings Add-Ons Electronic Signature(s) Signed: 01/05/2022 11:24:41 AM By: Levora Dredge Entered By: Levora Dredge on 01/05/2022 10:31:47

## 2022-01-06 NOTE — Progress Notes (Signed)
DARYA, BIGLER ([id number]) Visit Report for 01/05/2022 Physician Orders Details Patient Name: [name], [name]. Date of Service: 01/05/2022 10:00 AM Medical Record Number: [id number] Patient Account Number: [id number] Date of Birth/Sex: Mar 28, 1923 (88 y.o. F) Treating RN: Levora Dredge Primary Care Provider: Adrian Prows Other Clinician: Referring Provider: Adrian Prows Treating Provider/Extender: Skipper Cliche in Treatment: 60 Verbal / Phone Orders: No Diagnosis Coding Follow-up Appointments o Return Appointment in 1 week. Bathing/ Shower/ Hygiene o May shower with wound dressing protected with water repellent cover or cast protector. Edema Control - Lymphedema / Segmental Compressive Device / Other o Optional: One layer of unna paste to top of compression wrap (to act as an anchor). - and at toes o Patient to wear own compression stockings. Remove compression stockings every night before going to bed and put on every morning when getting up. - left leg o Elevate, Exercise Daily and Avoid Standing for Long Periods of Time. o Elevate legs to the level of the heart and pump ankles as often as possible o Elevate leg(s) parallel to the floor when sitting. Wound Treatment Wound #7 - Malleolus Wound Laterality: Right, Lateral Cleanser: Soap and Water 1 x Per Week/30 Days Discharge Instructions: Gently cleanse wound with antibacterial soap, rinse and pat dry prior to dressing wounds Primary Dressing: Prisma 4.34 (in) 1 x Per Week/30 Days Discharge Instructions: Moisten w/normal saline or sterile water; Cover wound as directed. Do not remove from wound bed. Secondary Dressing: Gauze 1 x Per Week/30 Days Discharge Instructions: As directed: dry, moistened with saline or moistened with Dakins Solution Compression Wrap: Profore Lite LF 3 Multilayer Compression Bandaging System 1 x Per Week/30 Days Discharge Instructions: Apply 3 multi-layer wrap as  prescribed. Electronic Signature(s) Signed: 01/05/2022 11:24:41 AM By: Levora Dredge Signed: 01/06/2022 4:48:07 PM By: Worthy Keeler PA-C Entered By: Levora Dredge on 01/05/2022 10:33:34 Bufford, [name] ([id number]) -------------------------------------------------------------------------------- SuperBill Details Patient Name: [name] Date of Service: 01/05/2022 Medical Record Number: [id number] Patient Account Number: [id number] Date of Birth/Sex: 04/03/1923 (88 y.o. F) Treating RN: Levora Dredge Primary Care Provider: Adrian Prows Other Clinician: Referring Provider: Adrian Prows Treating Provider/Extender: Skipper Cliche in Treatment: 33 Diagnosis Coding ICD-10 Codes Code Description [phone] Pressure ulcer of right ankle, stage 3 I89.0 Lymphedema, not elsewhere classified I87.2 Venous insufficiency (chronic) (peripheral) Z95.0 Presence of cardiac pacemaker Z79.01 Long term (current) use of anticoagulants Facility Procedures CPT4 Code: [id number] Description: (Facility Use Only) [phone] - [place] Modifier: Quantity: 1 Electronic Signature(s) Signed: 01/05/2022 11:24:41 AM By: Levora Dredge Signed: 01/06/2022 4:48:07 PM By: Worthy Keeler PA-C Entered By: Levora Dredge on 01/05/2022 10:36:02

## 2022-01-12 ENCOUNTER — Encounter: Payer: Medicare Other | Admitting: Physician Assistant

## 2022-01-12 ENCOUNTER — Other Ambulatory Visit: Payer: Self-pay

## 2022-01-12 DIAGNOSIS — L89513 Pressure ulcer of right ankle, stage 3: Secondary | ICD-10-CM | POA: Diagnosis not present

## 2022-01-12 NOTE — Progress Notes (Addendum)
[name], [name] ([id number]) Visit Report for 01/12/2022 Arrival Information Details Patient Name: [name], [name]. Date of Service: 01/12/2022 10:15 AM Medical Record Number: [id number] Patient Account Number: [id number] Date of Birth/Sex: May 12, 1923 (88 y.o. F) Treating RN: Carlene Coria Primary Care Laquonda Welby: Adrian Prows Other Clinician: Referring Olivette Beckmann: Adrian Prows Treating Hung Rhinesmith/Extender: Skipper Cliche in Treatment: 6 Visit Information History Since Last Visit All ordered tests and consults were completed: No Patient Arrived: [name] Added or deleted any medications: No Arrival Time: 10:19 Any new allergies or adverse reactions: No Accompanied By: daughter Had a fall or experienced change in No Transfer Assistance: None activities of daily living that may affect Patient Identification Verified: Yes risk of falls: Secondary Verification Process Completed: Yes Signs or symptoms of abuse/neglect since last visito No Patient Requires Transmission-Based No Hospitalized since last visit: No Precautions: Implantable device outside of the clinic excluding No Patient Has Alerts: Yes cellular tissue based products placed in the center Patient Alerts: Patient on Blood since last visit: Thinner Has Dressing in Place as Prescribed: Yes NOT DIABETIC Has Compression in Place as Prescribed: Yes ***ELIQUIS*** Pain Present Now: No Electronic Signature(s) Signed: 01/13/2022 9:21:59 AM By: Carlene Coria RN Entered By: Carlene Coria on 01/12/2022 10:23:53 [name], [name] ([id number]) -------------------------------------------------------------------------------- Clinic Level of Care Assessment Details Patient Name: [name] Date of Service: 01/12/2022 10:15 AM Medical Record Number: [id number] Patient Account Number: [id number] Date of Birth/Sex: 1923/02/21 (88 y.o. F) Treating RN: Carlene Coria Primary Care Natacha Jepsen: Adrian Prows Other  Clinician: Referring Remmy Crass: Adrian Prows Treating Kendan Cornforth/Extender: Skipper Cliche in Treatment: 34 Clinic Level of Care Assessment Items TOOL 1 Quantity Score []  - Use when EandM and Procedure [name] performed on INITIAL visit 0 ASSESSMENTS - Nursing Assessment / Reassessment []  - General Physical Exam (combine w/ comprehensive assessment (listed just below) when performed on new 0 pt. evals) []  - 0 Comprehensive Assessment (HX, ROS, Risk Assessments, Wounds Hx, etc.) ASSESSMENTS - Wound and Skin Assessment / Reassessment []  - Dermatologic / Skin Assessment (not related to wound area) 0 ASSESSMENTS - Ostomy and/or Continence Assessment and Care []  - Incontinence Assessment and Management 0 []  - 0 Ostomy Care Assessment and Management (repouching, etc.) PROCESS - Coordination of Care []  - Simple Patient / Family Education for ongoing care 0 []  - 0 Complex (extensive) Patient / Family Education for ongoing care []  - 0 Staff obtains Programmer, systems, Records, Test Results / Process Orders []  - 0 Staff telephones HHA, Nursing Homes / Clarify orders / etc []  - 0 Routine Transfer to another Facility (non-emergent condition) []  - 0 Routine Hospital Admission (non-emergent condition) []  - 0 New Admissions / Biomedical engineer / Ordering NPWT, Apligraf, etc. []  - 0 Emergency Hospital Admission (emergent condition) PROCESS - Special Needs []  - Pediatric / Minor Patient Management 0 []  - 0 Isolation Patient Management []  - 0 Hearing / Language / Visual special needs []  - 0 Assessment of Community assistance (transportation, D/C planning, etc.) []  - 0 Additional assistance / Altered mentation []  - 0 Support Surface(s) Assessment (bed, cushion, seat, etc.) INTERVENTIONS - Miscellaneous []  - External ear exam 0 []  - 0 Patient Transfer (multiple staff / Civil Service fast streamer / Similar devices) []  - 0 Simple Staple / Suture removal (25 or less) []  - 0 Complex Staple / Suture removal  (26 or more) []  - 0 Hypo/Hyperglycemic Management (do not check if billed separately) []  - 0 Ankle / Brachial Index (ABI) - do not check if billed separately Has the  patient been seen at the hospital within the last three years: Yes Total Score: 0 Level Of Care: ____ [name] ([id number]) Electronic Signature(s) Signed: 01/13/2022 9:21:59 AM By: Carlene Coria RN Entered By: Carlene Coria on 01/12/2022 10:52:04 [name], [name] ([id number]) -------------------------------------------------------------------------------- Encounter Discharge Information Details Patient Name: [name] Date of Service: 01/12/2022 10:15 AM Medical Record Number: [id number] Patient Account Number: [id number] Date of Birth/Sex: [birth year] (88 y.o. F) Treating RN: Carlene Coria Primary Care Linden Tagliaferro: Adrian Prows Other Clinician: Referring Marilene Vath: Adrian Prows Treating Merrisa Skorupski/Extender: Skipper Cliche in Treatment: 58 Encounter Discharge Information Items Post Procedure Vitals Discharge Condition: Stable Temperature (F): 98.5 Ambulatory Status: Walker Pulse (bpm): 83 Discharge Destination: Home Respiratory Rate (breaths/min): 18 Transportation: Private Auto Blood Pressure (mmHg): 137/83 Accompanied By: daughter Schedule Follow-up Appointment: Yes Clinical Summary of Care: Patient Declined Electronic Signature(s) Signed: 01/13/2022 9:21:59 AM By: Carlene Coria RN Entered By: Carlene Coria on 01/12/2022 10:54:47 [name], [name] ([id number]) -------------------------------------------------------------------------------- Lower Extremity Assessment Details Patient Name: [name] Date of Service: 01/12/2022 10:15 AM Medical Record Number: [id number] Patient Account Number: [id number] Date of Birth/Sex: [birth year] (88 y.o. F) Treating RN: Carlene Coria Primary Care Amberlee Garvey: Adrian Prows Other Clinician: Referring Ranferi Clingan: Adrian Prows Treating  Sheina Mcleish/Extender: Skipper Cliche in Treatment: 34 Edema Assessment Assessed: [Left: No] [Right: No] Edema: [Left: N] [Right: o] Calf Left: Right: Point of Measurement: 33 cm From Medial Instep 27 cm Ankle Left: Right: Point of Measurement: 10 cm From Medial Instep 17 cm Vascular Assessment Pulses: Dorsalis Pedis Palpable: [Right:Yes] Electronic Signature(s) Signed: 01/13/2022 9:21:59 AM By: Carlene Coria RN Entered By: Carlene Coria on 01/12/2022 10:38:02 Hunt, [name] ([id number]) -------------------------------------------------------------------------------- Multi Wound Chart Details Patient Name: [name] Date of Service: 01/12/2022 10:15 AM Medical Record Number: [id number] Patient Account Number: [id number] Date of Birth/Sex: July 20, 1923 (88 y.o. F) Treating RN: Carlene Coria Primary Care Jarom Govan: Adrian Prows Other Clinician: Referring Karole Oo: Adrian Prows Treating Sashia Campas/Extender: Skipper Cliche in Treatment: 34 Vital Signs Height(in): 62 Pulse(bpm): 83 Weight(lbs): 150 Blood Pressure(mmHg): 137/83 Body Mass Index(BMI): 27.4 Temperature(F): 98.5 Respiratory Rate(breaths/min): 18 Photos: [N/A:N/A] Wound Location: Right, Lateral Malleolus N/A N/A Wounding Event: Gradually Appeared N/A N/A Primary Etiology: Pressure Ulcer N/A N/A Comorbid History: Cataracts, Congestive Heart Failure, N/A N/A Hypertension, Peripheral Arterial Disease, Osteoarthritis, Neuropathy Date Acquired: 05/12/2021 N/A N/A Weeks of Treatment: 34 N/A N/A Wound Status: Open N/A N/A Wound Recurrence: No N/A N/A Measurements L x W x D (cm) 1x0.9x0.2 N/A N/A Area (cm) : 0.707 N/A N/A Volume (cm) : 0.141 N/A N/A % Reduction in Area: 37.00% N/A N/A % Reduction in Volume: 37.30% N/A N/A Starting Position 1 (o'clock): 12 Ending Position 1 (o'clock): 6 Maximum Distance 1 (cm): 0.2 Undermining: Yes N/A N/A Classification: Category/Stage III N/A N/A Exudate  Amount: Medium N/A N/A Exudate Type: Serosanguineous N/A N/A Exudate Color: red, brown N/A N/A Granulation Amount: Medium (34-66%) N/A N/A Granulation Quality: Red N/A N/A Necrotic Amount: Medium (34-66%) N/A N/A Exposed Structures: Fat Layer (Subcutaneous Tissue): N/A N/A Yes Fascia: No Tendon: No Muscle: No Joint: No Bone: No Epithelialization: None N/A N/A Treatment Notes Electronic Signature(s) Signed: 01/13/2022 9:21:59 AM By: Carlene Coria RN Geisen, [name] ([id number]) Entered By: Carlene Coria on 01/12/2022 10:46:50 Bantz, [name] ([id number]) -------------------------------------------------------------------------------- Multi-Disciplinary Care Plan Details Patient Name: [name] Date of Service: 01/12/2022 10:15 AM Medical Record Number: [id number] Patient Account Number: [id number] Date of Birth/Sex: 08-16-1923 (88 y.o. F) Treating RN: Carlene Coria Primary Care Anajulia Leyendecker: Adrian Prows  Other Clinician: Referring Else Habermann: Adrian Prows Treating Charli Liberatore/Extender: Skipper Cliche in Treatment: 34 Active Inactive Electronic Signature(s) Signed: 01/13/2022 9:21:59 AM By: Carlene Coria RN Entered By: Carlene Coria on 01/12/2022 10:46:35 Sobotta, [name] ([id number]) -------------------------------------------------------------------------------- Pain Assessment Details Patient Name: [name] Date of Service: 01/12/2022 10:15 AM Medical Record Number: [id number] Patient Account Number: [id number] Date of Birth/Sex: Aug 05, 1923 (88 y.o. F) Treating RN: Carlene Coria Primary Care Tamora Huneke: Adrian Prows Other Clinician: Referring Tinzlee Craker: Adrian Prows Treating Carrolyn Hilmes/Extender: Skipper Cliche in Treatment: 53 Active Problems Location of Pain Severity and Description of Pain Patient Has Paino No Site Locations Pain Management and Medication Current Pain Management: Electronic Signature(s) Signed: 01/13/2022 9:21:59 AM  By: Carlene Coria RN Entered By: Carlene Coria on 01/12/2022 10:24:36 Lagrow, [name] ([id number]) -------------------------------------------------------------------------------- Patient/Caregiver Education Details Patient Name: [name] Date of Service: 01/12/2022 10:15 AM Medical Record Number: [id number] Patient Account Number: [id number] Date of Birth/Gender: 09/28/1923 (88 y.o. F) Treating RN: Carlene Coria Primary Care Physician: Adrian Prows Other Clinician: Referring Physician: Adrian Prows Treating Physician/Extender: Skipper Cliche in Treatment: 63 Education Assessment Education Provided To: Patient Education Topics Provided Wound/Skin Impairment: Methods: Explain/Verbal Responses: State content correctly Electronic Signature(s) Signed: 01/13/2022 9:21:59 AM By: Carlene Coria RN Entered By: Carlene Coria on 01/12/2022 10:53:38 Wargo, [name] ([id number]) -------------------------------------------------------------------------------- Wound Assessment Details Patient Name: [name] Date of Service: 01/12/2022 10:15 AM Medical Record Number: [id number] Patient Account Number: [id number] Date of Birth/Sex: [birth year] (88 y.o. F) Treating RN: Carlene Coria Primary Care Seleena Reimers: Adrian Prows Other Clinician: Referring Keylan Costabile: Adrian Prows Treating Deborrah Mabin/Extender: Skipper Cliche in Treatment: 34 Wound Status Wound Number: 7 Primary Pressure Ulcer Etiology: Wound Location: Right, Lateral Malleolus Wound Open Wounding Event: Gradually Appeared Status: Date Acquired: 05/12/2021 Comorbid Cataracts, Congestive Heart Failure, Hypertension, Weeks Of Treatment: 34 History: Peripheral Arterial Disease, Osteoarthritis, Neuropathy Clustered Wound: No Photos Wound Measurements Length: (cm) 1 % Red Width: (cm) 0.9 % Red Depth: (cm) 0.2 Epith Area: (cm) 0.707 Tunn Volume: (cm) 0.141 Un[address] En Ma uction in Area:  37% uction in Volume: 37.3% elialization: None eling: No rmining: Yes arting Position (o'clock): 12 ding Position (o'clock): 6 ximum Distance: (cm) 0.2 Wound Description Classification: Category/Stage III Foul Exudate Amount: Medium Sloug Exudate Type: Serosanguineous Exudate Color: red, brown Odor After Cleansing: No h/Fibrino Yes Wound Bed Granulation Amount: Medium (34-66%) Exposed Structure Granulation Quality: Red Fascia Exposed: No Necrotic Amount: Medium (34-66%) Fat Layer (Subcutaneous Tissue) Exposed: Yes Necrotic Quality: Adherent Slough Tendon Exposed: No Muscle Exposed: No Joint Exposed: No Bone Exposed: No Treatment Notes Wound #7 (Malleolus) Wound Laterality: Right, Lateral Cleanser Soap and Water Olvera, [name] ([id number]) Discharge Instruction: Gently cleanse wound with antibacterial soap, rinse and pat dry prior to dressing wounds Peri-Wound Care Topical Primary Dressing Algicell Calcium Alginate Dressing 2x2 (in/in) Medihoney - Gel, 1.5 (oz), tube Secondary Dressing Gauze Discharge Instruction: As directed: dry, moistened with saline or moistened with Dakins Solution Secured With Compression Wrap Profore Lite LF 3 Multilayer Compression Bandaging System Discharge Instruction: Apply 3 multi-layer wrap as prescribed. Compression Stockings Add-Ons Electronic Signature(s) Signed: 01/13/2022 9:21:59 AM By: Carlene Coria RN Entered By: Carlene Coria on 01/12/2022 10:37:08 Rico, [name] ([id number]) -------------------------------------------------------------------------------- Vitals Details Patient Name: [name] Date of Service: 01/12/2022 10:15 AM Medical Record Number: [id number] Patient Account Number: [id number] Date of Birth/Sex: [birth year] (88 y.o. F) Treating RN: Carlene Coria Primary Care [name] [name]: Adrian Prows Other Clinician: Referring Rasha Ibe: Adrian Prows Treating Kyisha Fowle/Extender: Skipper Cliche in  Treatment: 34 Vital Signs Time Taken: 10:23 Temperature (F): 98.5 Height (in): 62 Pulse (bpm): 83 Weight (lbs): 150 Respiratory Rate (breaths/min): 18 Body Mass Index (BMI): 27.4 Blood Pressure (mmHg): 137/83 Reference Range: 80 - 120 mg / dl Electronic Signature(s) Signed: 01/13/2022 9:21:59 AM By: Carlene Coria RN Entered By: Carlene Coria on 01/12/2022 10:24:22

## 2022-01-12 NOTE — Progress Notes (Addendum)
[name] ([id number]) Visit Report for 01/12/2022 Chief Complaint Document Details Patient Name: [name], [name]. Date of Service: 01/12/2022 10:15 AM Medical Record Number: [id number] Patient Account Number: [id number] Date of Birth/Sex: [birth year] (88 y.o. F) Treating RN: [name] Primary Care Provider: Adrian [name] Other Clinician: Referring Provider: Adrian [name] Treating Provider/Extender: [name] in Treatment: 44 Information Obtained from: Patient Chief Complaint Right foot ulcer Electronic Signature(s) Signed: 01/12/2022 10:16:52 AM By: [name] Keeler PA-C Entered By: [name] on 01/12/2022 10:16:51 [name], [name] ([id number]) -------------------------------------------------------------------------------- Debridement Details Patient Name: [name] Date of Service: 01/12/2022 10:15 AM Medical Record Number: [id number] Patient Account Number: [id number] Date of Birth/Sex: 1923-02-05 (88 y.o. F) Treating RN: [name] Primary Care Provider: Adrian [name] Other Clinician: Referring Provider: Adrian [name] Treating Provider/Extender: [name] in Treatment: 34 Debridement Performed for Wound #7 Right,Lateral Malleolus Assessment: Performed By: Physician [name] Sams., PA-C Debridement Type: Debridement Level of Consciousness (Pre- Awake and Alert procedure): Pre-procedure Verification/Time Out Yes - 10:45 Taken: Start Time: 10:45 Pain Control: Lidocaine Total Area Debrided (L x W): 1 (cm) x 0.9 (cm) = 0.9 (cm) Tissue and other material Viable, Non-Viable, Slough, Subcutaneous, Skin: Dermis , Skin: Epidermis, Slough debrided: Level: Skin/Subcutaneous Tissue Debridement Description: Excisional Instrument: Curette Bleeding: Moderate Hemostasis Achieved: Pressure End Time: 10:48 Procedural Pain: 0 Post Procedural Pain: 0 Response to Treatment: Procedure was tolerated well Level of Consciousness  (Post- Awake and Alert procedure): Post Debridement Measurements of Total Wound Length: (cm) 1 Stage: Category/Stage III Width: (cm) 0.9 Depth: (cm) 0.2 Volume: (cm) 0.141 Character of Wound/Ulcer Post Debridement: Improved Post Procedure Diagnosis Same as Pre-procedure Electronic Signature(s) Signed: 01/12/2022 5:14:54 PM By: [name] Keeler PA-C Signed: 01/13/2022 9:21:59 AM By: [name] Coria RN Entered By: [name] on 01/12/2022 10:48:07 [name], [name] ([id number]) -------------------------------------------------------------------------------- HPI Details Patient Name: [name] Date of Service: 01/12/2022 10:15 AM Medical Record Number: [id number] Patient Account Number: [id number] Date of Birth/Sex: Apr 20, 1923 (88 y.o. 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 Dr. Amalia [name] the podiatrist saw her on 07/20/2017 and sent her to the wound center for possible hyperbaric oxygen therapy. past medical history of peripheral vascular disease, varicose veins, status post appendectomy, basal cell carcinoma excision from the left leg, cholecystectomy, pacemaker placement, right lower extremity angiography done by [name] in March 2017 with placement of a stent. there is also note of a successful ablation of the right small saphenous vein done which was reviewed by ultrasound on 10/24/2016. the patient had a right small saphenous vein ablation done on 10/20/2016. The patient has never been a smoker. She has been seen by Dr. Corene Cornea [name] the vascular surgeon who most recently saw her on 06/15/2017 for  evaluation of ongoing problems with right leg swelling. She had a lower extremity arterial duplex examination done(02/13/17) which showed patent distal right superficial femoral artery stent and above-the-knee popliteal stent without evidence of restenosis. The ABI was more than 1.3 on the right and more than 1.3 on the left. This was consistent with noncompressible arteries due to medial calcification. The right great toe pressure and PPG waveforms are within normal limits and the left great toe pressure  and PPG waveforms are decreased. he recommended she continue to wear her compression stockings and continue with elevation. She is scheduled to have a noninvasive arterial study in the near future 08/16/2017 -- had a lower extremity arterial duplex examination done which showed patent distal right superficial femoral artery stent and above-the-knee popliteal stent without evidence of restenosis. The ABI was more than 1.3 on the right and more than 1.3 on the left. This was consistent with noncompressible arteries due to medial calcification. The right great toe pressure and PPG waveforms are within normal limits and the left great toe pressure and PPG waveforms are decreased. the x-ray of the right ankle has not yet been done 08/24/2017 -- had a right ankle x-ray -- IMPRESSION:1. No fracture, bone lesion or evidence of osteomyelitis. 2. Lateral soft tissue swelling with a soft tissue ulcer. she has not yet seen the vascular surgeon for review 08/31/17 on evaluation today patient's wound appears to be showing signs of improvement. She still with her appointment with vascular in order to review her results of her vascular study and then determine if any intervention would be recommended at that time. No fevers, chills, nausea, or vomiting noted at this time. She has been tolerating the dressing changes without complication. 09/28/17 on evaluation today patient's wound appears to show signs of good  improvement in regard to the granulation tissue which is surfacing. There is still a layer of slough covering the wound and the posterior portion is still significantly deeper than the anterior nonetheless there has been some good sign of things moving towards the better. She is going to go back to [name] for reevaluation to ensure her blood flow is still appropriate. That will be before her next evaluation with [place] next week. No fevers, chills, nausea, or vomiting noted at this time. Patient does have some discomfort rated to be a 3-4/10 depending on activity specifically cleansing the wound makes it worse. 10/05/2017 -- the patient was seen by Dr. Lucky Cowboy last week and noninvasive studies showed a normal right ABI with brisk triphasic waveforms consistent with no arterial insufficiency including normal digital pressures. The duplex showed a patent distal right SFA stent and the proximal SFA was also normal. He was pleased with her test and thought she should have enough of perfusion for normal wound healing. He would see her back in 6 months time. 12/21/17 on evaluation today patient appears to be doing fairly well in regard to her right lateral ankle wound. Unfortunately the main issue that she is expansion at this point is that she is having some issues with what appears to be some cellulitis in the right anterior shin. She has also been noting a little bit of uncomfortable feeling especially last night and her ankle area. I'm afraid that she made the developing a little bit of an infection. With that being said I think it is in the early stages. 12/28/17 on evaluation today patient's ankle appears to be doing excellent. She's making good progress at this point the cellulitis seems to have improved after last week's evaluation. Overall she is having no significant discomfort which is excellent news. She does have an appointment with [name] on March 29, 2018 for reevaluation in regard to the stent he  placed. She seems to have excellent blood flow in the right lower extremity. 01/19/12 on evaluation today patient's wound appears to be doing very well. In fact she does not appear to require debridement at this point, there's no evidence  of infection, and overall from the standpoint of the wound she seems to be doing very well. With that being said I believe that it may be time to switch to different dressing away from the [facility] Dressing she tells me she does have a lot going on her friend actually passed away yesterday and she's also having a lot of issues with her husband this obviously is weighing heavy on her as far as your thoughts and concerns today. 01/25/18 on evaluation today patient appears to be doing fairly well in regard to her right lateral malleolus. She has been tolerating the dressing changes without complication. Overall I feel like this is definitely showing signs of improvement as far as how the overall appearance of the wound is there's also evidence of epithelium start to migrate over the granulation tissue. In general I think that she is progressing nicely as far as the wound is concerned. The only concern she really has is whether or not we can switch to every other week visits in order to avoid having as many appointments as her daughters have a difficult time getting her to her appointments as well as the patient's husband to his he is not doing very well at this point. 02/22/18 on evaluation today patient's right lateral malleolus ulcer appears to be doing great. She has been tolerating the dressing changes without complication. Overall you making excellent progress at this time. Patient is having no significant discomfort. AYAKO, TAPANES ([id number]) 03/15/18 on evaluation today patient appears to be doing much more poorly in regard to her right lateral ankle ulcer at this point. Unfortunately since have last seen her her husband has passed just a few days ago is  obviously weighed heavily on her her daughter also had surgery well she is with her today as usual. There does not appear to be any evidence of infection she does seem to have significant contusion/deep tissue injury to the right lateral malleolus which was not noted previous when I saw her last. It's hard to tell of exactly when this injury occurred although during the time she was spending the night in the hospital this may have been most likely. 03/22/18 on evaluation today patient appears to actually be doing very well in regard to her ulcer. She did unfortunately have a setback which was noted last week however the good news is we seem to be getting back on track and in fact the wound in the core did still have some necrotic tissue which will be addressed at this point today but in general I'm seeing signs that things are on the up and up. She is glad to hear this obviously she's been somewhat concerned that due to the how her wound digressed more recently. 03/29/18 on evaluation today patient appears to be doing fairly well in regard to her right lower extremity lateral malleolus ulcer. She unfortunately does have a new area of pressure injury over the inferior portion where the wound has opened up a little bit larger secondary to the pressure she seems to be getting. She does tell me sometimes when she sleeps at night that it actually hurts and does seem to be pushing on the area little bit more unfortunately. There does not appear to be any evidence of infection which is good news. She has been tolerating the dressing changes without complication. She also did have some bruising in the left second and third toes due to the fact that she may have bump this or  injured it although she has neuropathy so she does not feel she did move recently that may have been where this came from. Nonetheless there does not appear to be any evidence of infection at this time. 04/12/18 on evaluation today patient's  wound on the right lateral ankle actually appears to be doing a little bit better with a lot of necrotic docking tissue centrally loosening up in clearing away. However she does have the beginnings of a deep tissue injury on the left lateral malleolus likely due to the fact we've been trying offload the right as much as we have. I think she may benefit from an assistive soft device to help with offloading and it looks like they're looking at one of the doughnut conditions that wraps around the lower leg to offload which I think will definitely do a good job. With that being said I think we definitely need to address this issue on the left before it becomes a wound. Patient is not having significant pain. 04/19/18 on evaluation today patient appears to be doing excellent in regard to the progress she's made with her right lateral ankle ulcer. The left ankle region which did show evidence of a deep tissue injury seems to be resolving there's little fluid noted underneath and a blister there's nothing open at this point in time overall I feel like this is progressing nicely which is good news. She does not seem to be having significant discomfort at this point which is also good news. 04/25/18-She is here in follow up evaluation for bilateral lateral malleolar ulcers. The right lateral malleolus ulcer with pale subcutaneous tissue exposure, central area of ulcer with tendon/periosteum exposed. The left lateral malleolus ulcer now with central area of nonviable tissue, otherwise deep tissue injury. She is wearing compression wraps to the left lower extremity, she will place the right lower extremity compression wraps on when she gets home. She will be out of town over the weekend and return next week and follow-up appointment. She completed her doxycycline this morning 05/03/18 on evaluation today patient appears to be doing very well in regard to her right lateral ankle ulcer in general. At least she's  showing some signs of improvement in this regard. Unfortunately she has some additional injury to the left lateral malleolus region which appears to be new likely even over the past several days. Again this determination is based on the overall appearance. With that being said the patient is obviously frustrated about this currently. 05/10/18-She is here in follow-up evaluation for bilateral lateral malleolar ulcers. She states she has purchased offloading shoes/boots and they will arrive tomorrow. She was asked to bring them in the office at next week's appointment so her provider is aware of product being utilized. She continues to sleep on right or left side, she has been encouraged to sleep on her back. The right lateral malleolus ulcer is precariously close to peri-osteum; will order xray. The left lateral malleolus ulcer is improved. Will switch back to santyl; she will follow up next week. 05/17/18 on evaluation today patient actually appears to be doing very well in regard to her malleolus her ulcers compared to last time I saw them. She does not seem to have as much in the way of contusion at this point which is great news. With that being said she does continue to have discomfort and I do believe that she is still continuing to benefit from the offloading/pressure reducing boots that were recommended. I think this is  the key to trying to get this to heal up completely. 05/24/18 on evaluation today patient actually appears to be doing worse at this point in time unfortunately compared to her last week's evaluation. She is having really no increased pain which is good news unfortunately she does have more maceration in your theme and noted surrounding the right lateral ankle the left lateral ankle is not really is erythematous I do not see signs of the overt cellulitis on that side. Unfortunately the wounds do not seem to have shown any signs of improvement since the last evaluation. She also has  significant swelling especially on the right compared to previous some of this may be due to infection however also think that she may be served better while she has these wounds by compression wrapping versus continuing to use the Juxta-Lite for the time being. Especially with the amount of drainage that she is experiencing at this point. No fevers, chills, nausea, or vomiting noted at this time. 05/31/18 on evaluation today patient appears to actually be doing better in regard to her right lateral lower extremity ulcer specifically on the malleolus region. She has been tolerating the antibiotic without complication. With that being said she still continues to have issues but a little bit of redness although nothing like she what she was experiencing previous. She still continues to pressure to her ankle area she did get the problem on offloading boots unfortunately she will not wear them she states there too uncomfortable and she can't get in and out of the bed. Nonetheless at this point her wounds seem to be continually getting worse which is not what we want I'm getting somewhat concerned about her progress and how things are going to proceed if we do not intervene in some way shape or form. I therefore had a very lengthy conversation today about offloading yet again and even made a specific suggestion for switching her to a memory foam mattress and even gave the information for a specific one that they could look at getting if it was something that they were interested in considering. She does not want to be considered for a hospital bed air mattress although honestly insurance would not cover it that she does not have any wounds on her trunk. 06/14/18 on evaluation today both wounds over the bilateral lateral malleolus her ulcers appear to be doing better there's no evidence of pressure injury at this point. She did get the foam mattress for her bed and this does seem to have been extremely  beneficial for her in my pinion. Her daughter states that she is having difficulty getting out of bed because of how soft it is. The patient also relates this to be. Nonetheless I do feel like she's actually doing better. Unfortunately right after and around the time she was getting the mattress she also sustained a fall when she got up to go pick up the phone and ended up injuring her right elbow she has 18 sutures in place. We are not caring for this currently although home health is going to be taking the sutures out shortly. Nonetheless this may be something that we need to evaluate going forward. It depends on how well it has or has not healed in the end. She also recently saw an orthopedic specialist for an injection in the right shoulder just before her fall unfortunately the fall seems to have worsened her pain. 06/21/18 on evaluation today patient appears to be doing about the same in  regard to her lateral malleolus ulcers. Both appear to be just a little bit deeper but again we are clinging away the necrotic and dead tissue which I think is why this is progressing towards a deeper realm as opposed IZZABELLA, BESSE. ([id number]) to improving from my measurement standpoint in that regard. Nonetheless she has been tolerating the dressing changes she absolutely hates the memory foam mattress topper that was obtained for her nonetheless I do believe this is still doing excellent as far as taking care of excess pressure in regard to the lateral malleolus regions. She in fact has no pressure injury that I see whereas in weeks past it was week by week I was constantly seeing new pressure injuries. Overall I think it has been very beneficial for her. 07/03/18; patient arrives in my clinic today. She has deep punched out areas over her bilateral lateral malleoli. The area on the right has some more depth. We spent a lot of time today talking about pressure relief for these areas. This started when her  daughter asked for a prescription for a memory foam mattress. I have never written a prescription for a mattress and I don't think insurances would pay for that on an ordinary bed. In any case he came up that she has foam boots that she refuses to wear. I would suggest going to these before any other offloading issues when she is in bed. They say she is meticulous about offloading this the rest of the day 07/10/18- She is seen in follow-up evaluation for bilateral, lateral malleolus ulcers. There is no improvement in the ulcers. She has purchased and is sleeping on a memory foam mattress/overlay, she has been using the offloading boots nightly over the past week. She has a follow up appointment with vascular medicine at the end of October, in my opinion this follow up should be expedited given her deterioration and suboptimal TBI results. We will order plain film xray of the left ankle as deeper structures are palpable; would consider having MRI, regardless of xray report(s). The ulcers will be treated with iodoflex/iodosorb, she is unable to safely change the dressings daily with santyl. 07/19/18 on evaluation today patient appears to be doing in general visually well in regard to her bilateral lateral malleolus ulcers. She has been tolerating the dressing changes without complication which is good news. With that being said we did have an x-ray performed on 07/12/18 which revealed a slight loosen see in the lateral portion of the distal left fibula which may represent artifact but underline lytic destruction or osteomyelitis could not be excluded. MRI was recommended. With that being said we can see about getting the patient scheduled for an MRI to further evaluate this area. In fact we have that scheduled currently for August 20 19,019. 07/26/18 on evaluation today patient's wound on the right lateral ankle actually appears to be doing fairly well at this point in my pinion. She has made some good  progress currently. With that being said unfortunately in regard to the left lateral ankle ulcer this seems to be a little bit more problematic at this time. In fact as I further evaluated the situation she actually had bone exposed which is the first time that's been the case in the bone appear to be necrotic. Currently I did review patient's note from Dr. Bunnie Domino office with [facility]. He stated that ABI was 1.26 on the right and 0.95 on the left with good waveforms. Her perfusion  is stable not reduced from previous studies and her digital waveforms were pretty good particularly on the right. His conclusion upon review of the note was that there was not much she could do to improve her perfusion and he felt she was adequate for wound healing. His suggestion was that she continued to see [place] and consider a synthetic skin graft if there was no underlying infection. He plans to see her back in six months or as needed. 08/01/18 on evaluation today patient appears to be doing better in regard to her right lateral ankle ulcer. Her left lateral ankle ulcer is about the same she still has bone involvement in evidence of necrosis. There does not appear to be evidence of infection at this time On the right lateral lower extremity. I have started her on the Augmentin she picked this up and started this yesterday. This is to get her through until she sees infectious disease which is scheduled for 08/12/18. 08/06/18 on evaluation today patient appears to be doing rather well considering my discussion with patient's daughter at the end of last week. The area which was marked where she had erythema seems to be improved and this is good news. With that being said overall the patient seems to be making good improvement when it comes to the overall appearance of the right lateral ankle ulcer although this has been slow she at least is coming around in this regard. Unfortunately in regard to the left  lateral ankle ulcer this is osteomyelitis based on the pathology report as well is bone culture. Nonetheless we are still waiting CT scan. Unfortunately the MRI we originally ordered cannot be performed as the patient is a pacemaker which I had overlooked. Nonetheless we are working on the CT scan approval and scheduling as of now. She did go to the hospital over the weekend and was placed on IV Cefzo for a couple of days. Fortunately this seems to have improved the erythema quite significantly which is good news. There does not appear to be any evidence of worsening infection at this time. She did have some bleeding after the last debridement therefore I did not perform any sharp debridement in regard to left lateral ankle at this point. Patient has been approved for a snap vac for the right lateral ankle. 08/14/18; the patient with wounds over her bilateral lateral malleoli. The area on the right actually looks quite good. Been using a snap back on this area. Healthy granulation and appears to be filling in. Unfortunately the area on the left is really problematic. 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 Also noted to have cellulitis. She saw Dr. Novella Olive of infectious disease today and was put on linezolid. We are able to verify this with her pharmacy. She is completed the Augmentin that she was already on. We've been using Iodoflex to this area 08/23/18 on evaluation today patient's wounds both actually appear to be doing better compared to my prior evaluations. Fortunately she showing signs of good improvement in regard to the overall wound status especially where were using the snap vac on the right. In regard to left lateral malleolus the wound bed actually appears to be much cleaner than previously noted. I do not feel any phone directly probed during evaluation today and though there is tendon noted this does not  appear to be necrotic it's actually fairly good as far as the overall appearance  of the tendon is concerned. In general the wound bed actually appears to be doing significantly better than it was previous. Patient is currently in the care of [name] and I did review that note today. He actually has her on two weeks of linezolid and then following the patient will be on 1-2 months of Keflex. That is the plan currently. She has been on antibiotics therapy as prescribed by myself initially starting on July 30, 2018 and has been on that continuously up to this point. 08/30/18 on evaluation today patient actually appears to be doing much better in regard to her right lateral malleolus ulcer. She has been tolerating the dressing changes specifically the snap vac without complication although she did have some issues with the seal currently. Apparently there was some trouble with getting it to maintain over the past week past Sunday. Nonetheless overall the wound appears better in regard to the right lateral malleolus region. In regard to left lateral malleolus this actually show some signs of additional granulation although there still tendon noted in the base of the wound this appears to be healthy not necrotic in any way whatsoever. We are considering potentially using a snap vac for the left lateral malleolus as well the product wrap from [employer], [name], was present in the clinic today we're going to see this patient I did have her come in with me after obtaining consent from the patient and her daughter in order to look at the wound and see if there's any recommendation one way or another as to whether or not they felt the snapback could be beneficial for the left lateral malleolus region. But the conclusion was that it might be but that this is definitely a little bit deeper wound than what traditionally would be utilized for a snap vac. 09/06/18 on evaluation today patient actually appears to be doing  excellent in my pinion in regard to both ankle ulcers. She has been tolerating the dressing changes without complication which is great news. Specifically we have been using the snap vac. In regard to the right ankle I'm not even sure that this is going to be necessary for today and following as the wound has filled in quite nicely. In regard to the left ankle I do believe [name], [name] ([id number]) that we're seeing excellent epithelialization from the edge as well as granulation in the central portion the tendon is still exposed but there's no evidence of necrotic bone and in general I feel like the patient has made excellent progress even compared to last week with just one week of the snap vac. 09/11/18; this is a patient who has wounds on her bilateral lateral malleoli. Initially both of these were deep stage IV wounds in the setting of chronic arterial insufficiency. She has been revascularized. As I understand think she been using snap vacs to both of these wounds however the area on the right became more superficial and currently she is only using it on the left. Using silver collagen on the right and silver collagen under the back on the left I believe 09/19/18 on evaluation today patient actually appears to be doing very well in regard to her lateral malleolus or ulcers bilaterally. She has been tolerating the dressing changes without complication. Fortunately there does not appear to be any evidence of infection at this time. Overall I feel like she is improving in an excellent manner and I'm very pleased with the fact that everything seems to be turning towards  the better for her. This has obviously been a long road. 09/27/18 on evaluation today patient actually appears to be doing very well in regard to her bilateral lateral malleolus ulcers. She has been tolerating the dressing changes without complication. Fortunately there does not appear to be any evidence of infection at this  time which is also great news. No fevers, chills, nausea, or vomiting noted at this time. Overall I feel like she is doing excellent with the snap vac on the left malleolus. She had 40 mL of fluid collection over the past week. 10/04/18 on evaluation today patient actually appears to be doing well in regard to her bilateral lateral malleolus ulcers. She continues to tolerate the dressing changes without complication. One issue that I see is the snap vac on the left lateral malleolus which appears to have sealed off some fluid underlying this area and has not really allowed it to heal to the degree that I would like to see. For that reason I did suggest at this point we may want to pack a small piece of packing strip into this region to allow it to more effectively wick out fluid. 10/11/18 in general the patient today does not feel that she has been doing very well. She's been a little bit lethargic and subsequently is having bodyaches as well according to what she tells me today. With that being said overall she has been concerned with the fact that something may be worsening although to be honest her wounds really have not been appearing poorly. She does have a new ulcer on her left heel unfortunately. This may be pressure related. Nonetheless it seems to me to have potentially started at least as a blister I do not see any evidence of deep tissue injury. In regard to the left ankle the snap vac still seems to be causing the ceiling off of the deeper part of the wound which is in turn trapping fluid. I'm not extremely pleased with the overall appearance as far as progress from last week to this week therefore I'm gonna discontinue the snap vac at this point. 10/18/18 patient unfortunately this point has not been feeling well for the past several days. She was seen by Grayland Ormond her primary care provider who is a Librarian, academic at [employer]. Subsequently she states that she's been very  weak and generally feeling malaise. No fevers, chills, nausea, or vomiting noted at this time. With that being said bloodwork was performed at the PCP office on the 11th of this month which showed a white blood cell count of 10.7. This was repeated today and shows a white blood cell count of 12.4. This does show signs of worsening. Coupled with the fact that she is feeling worse and that her left ankle wound is not really showing signs of improvement I feel like this is an indication that the osteomyelitis is likely exacerbating not improving. Overall I think we may also want to check her C-reactive protein and sedimentation rate. Actually did call [facility] office this afternoon while the patient was in the office here with me. Subsequently based on the findings we discussed treatment possibilities and I think that it is appropriate for [place] to go ahead and initiate treatment with doxycycline which I'm going to do. Subsequently he did agree to see about adding a CRP and sedimentation rate to her orders. If that has not already been drawn to where they can run it they will contact the patient she can  come back to have that check. They are in agreement with plan as far as the patient and her daughter are concerned. Nonetheless also think we need to get in touch with Dr. Henreitta Leber office to see about getting the patient scheduled with him as soon as possible. 11/08/18 on evaluation today patient presents for follow-up concerning her bilateral foot and ankle ulcers. I did do an extensive review of her chart in epic today. Subsequently she was seen by [name] he did initiate Cefepime IV antibiotic therapy. Subsequently she had some issues with her PICC line this had to be removed because it was coiled and then replaced. Fortunately that was now settled. Unfortunately she has continued have issues with her left heel as well as the issues that she is experiencing with her bilateral lateral malleolus  regions. I do believe however both areas seem to be doing a little bit better on evaluation today which is good news. No fevers, chills, nausea, or vomiting noted at this time. She actually has an angiogram schedule with [name] on this coming Monday, November 11, 2018. Subsequently the patient states that she is feeling much better especially than what she was roughly 2 weeks ago. She actually had to cancel an appointment because she was feeling so poorly. No fevers, chills, nausea, or vomiting noted at this time. 11/15/18 on evaluation today patient actually is status post having had her angiogram with [name] Monday, four days ago. 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 He had to go and work on several areas of the vasculature fortunately he was able to obtain no more than a 30% residual stenosis throughout post procedure. I reviewed this note today. I think this will definitely help with healing at this time. Fortunately there does not appear to be any signs of infection and I do feel like ratio already has a better appearance to it. 11/22/18 upon evaluation today patient actually appears to be doing very well in regard to her wounds in general. The right lateral malleolus looks excellent the heel looks better in the left lateral malleolus also appears to be doing a little better. With that being said the right second toe actually appears to be open and training we been watching this is been dry and stable but now is open. 12/03/2018 Seen today for follow-up and management of multiple bilateral lower extremity wounds. New pressure injury of the great toe which is closed at this time. Wound of the right distal second toe appears larger today with deep undermining and a pocket of fluid present within the undermining region. Left and right malleolus is wounds are stable today with no signs and symptoms of infection.Denies any needs or concerns during exam today. 12/13/18  on evaluation today patient appears to be doing somewhat better in regard to her left heel ulcer. She also seems to be completely healed in regard to the right lateral malleolus ulcer. The left malleolus ulcer is smaller what unfortunately the wounds which are new over the first and second toes of the right foot are what are most concerning at this point especially the second. Both areas did require sharp debridement today. 12/20/18 on evaluation today patient's wound actually appears to be doing better in regard to left lateral ankle and her right lateral ankle continues to remain healed. The hill ulcer on the left is improved. She does have improvement noted as well in regard to both toe ulcers. Overall I'm very  pleased in this regard. No fevers, chills, nausea, or vomiting noted at this time. 12/23/18 on evaluation today patient is seen after she had her toenails trimmed at the podiatrist office due to issues with her right great toe. There was what appeared to be dark eschar on the surface of the wound which had her in the podiatrist concerned. Nonetheless as I remember that during the last office visit I had utilize silver nitrate of this area I was much less concerned about the situation. Subsequently I was able to clean off much of this tissue without any complication today. This does not appear to show any signs of infection and actually look somewhat better Deroos, [name] ([id number]) compared to last time post debridement. Her second toe on the right foot actually had callous over and there did appear still be some fluid underneath this that would require debridement today. 12/27/18 on evaluation today patient actually appears to be showing signs of improvement at all locations. Even the left lateral ankle although this is not quite as great as the other sites. Fortunately there does not appear to be any signs of infection at this time and both of her toes on the right foot seem to be  showing signs of improvement which is good news and very pleased in this regard. 01/03/19 on evaluation today patient appears to be doing better for the most part in regard to her wounds in particular. There does not appear to be any evidence of infection at this time which is good news. Fortunately there is no sign of really worsening anywhere except for the right great toe which she does have what appears to be a bruise/deep tissue injury which is very superficial and already resolving. I'm not sure where this came from I questioned her extensively and she does not recall what may have happened with this. Other than that the patient seems to be doing well even the left lateral ankle ulcer looks good and is getting smaller. 01/10/19 on evaluation today patient appears to be doing well in regard to her left heel wound and both of her toe wounds. Overall I feel like there is definitely improvement here and I'm happy in that regard. With that being said unfortunately she is having issues with the left lateral malleolus ulcer which unfortunately still has a lot of depth to it. This is gonna be a very difficult wound for [place] to be able to truly get to heal. I may want to consider some type of skin substitute to see if this would be of benefit for her. I'll discuss this with her more the next visit most likely. This was something I thought about more at the end of the visit when I was Artie out of the room and the patient had been discharged. 01/17/19 on evaluation today patient appears to be doing very well in regard to her wounds in general. She's been making excellent progress at this time. Fortunately there's no sign of infection at this time either. No fevers, chills, nausea, or vomiting noted at this time. The biggest issue is still her left lateral malleolus where it appears to be doing well and is getting smaller but still shows a small corner where this is deeper and goes down into what appears to be  the joint space. Nonetheless this is taking much longer to heal although it still looks better in smaller than previous evaluations. 01/24/19 on evaluation today patient's wounds actually appear to be doing rather well in  general overall. She did require some sharp debridement in regard to the right great toe but everything else appears to be doing excellent no debridement was even necessary. No fevers, chills, nausea, or vomiting noted at this time. 01/31/19 on evaluation today patient actually appears to be doing much better in regard to her left foot wound on the heel as well as the ankle. The right great toe appears to be a little bit worse today this had callous over and trapped a lot of fluid underneath. Fortunately there's no signs of infection at any site which is great news. 02/07/19 on evaluation today patient actually appears to be doing decently well in regard to all of her ulcers at this point. No sharp debridement was required she is a little bit of hyper granulation in regard to the left lateral ankle as well as the left heel but the hill itself is almost completely healed which is excellent news. Overall been very pleased in this regard. 02/14/19 on evaluation today patient actually appears to be doing very well in regard to her ulcers on the right first toe, left lateral malleolus, and left heel. In fact the heel is almost completely healed at this point. The patient does not show any signs of infection which is good news. Overall very pleased with how things have progressed. 04/18/19 Telehealth Evaluation During the COVID-19 National Emergency: Verbal Consent: Obtained from patient Allergies: reviewed and the active list is current. Medication changes: patient has no current medication changes. COVID-19 Screening: 1. Have you traveled internationally or on a cruise ship in the last 14 dayso No 2. Have you had contact with someone with or under investigation for COVID-19o No 3. Have  you had a fever, cough, sore throat, or experiencing shortness of breatho No on evaluation today actually did have a visit with this patient through a telehealth encounter with her home health nurse. Subsequently it was noted that the patient actually appears to be doing okay in regard to her wounds both the right great toe as well as the left lateral malleolus have shown signs of improvement although this in your theme around the left lateral malleolus there eschar coverings for both locations. The question is whether or not they are actually close and whether or not home health needs to discharge the patient or not. Nonetheless my concern is this point obviously is that without actually seeing her and being able to evaluate this directly I cannot ensure that she is completely healed which is the question that I'm being asked. 04/22/19 on evaluation today patient presents for her first evaluation since last time I saw her which was actually February 14, 2019. I did do a telehealth visit last week in which point it was questionable whether or not she may be healed and had to bring her in today for confirmation. With that being said she does seem to be doing quite well at this point which is good news. There does not appear to be any drainage in the deed I believe her wounds may be healed. Readmission: 09/04/2019 on evaluation today patient appears to be doing unfortunately somewhat more poorly in regard to her left foot ulcer secondary to a wound that began on 08/21/2019 at least when she first noticed this. Fortunately she has not had any evidence of active infection at this time. Systemically. I also do not necessarily see any evidence of infection at the blister/wound site on the first metatarsal head plantar aspect. This almost appears to be  something that may have just rubbed inappropriately causing this to breakdown. They did not want a wait too long to come in to be seen as again she had significant  issues in the past with wounds that took quite a while to heal in fact it was close to 2 years. Nonetheless this does not appear to be quite that bad but again we do need to remove some of the necrotic tissue from the surface of the wound to tell exactly the extent. She does not appear to have any significant arterial disease at this point and again her last ABIs and TBI's are recorded above in the alert section her left ABI was 1.27 with a TBI of 0.72 to the right ABI 1.08 with a TBI of 0.39. Other than this the patient has been doing quite well since I last saw her and that was in May 2020. 09/11/2019 on evaluation today patient appeared to be doing very well with regard to her plantar foot ulcer on the left. In fact this appears to be almost completely healed which is awesome. That is after just 1 week of intervention. With that being said there is no signs of active infection at this time. [name], [name] ([id number]) 09/18/2019 on evaluation today patient actually appears to be doing excellent in fact she is completely healed based on what I am seeing at this point. Fortunately there is no signs of active infection at this time and overall patient is very pleased to hear that this area has healed so quickly. Readmission: 05/13/2021 upon evaluation today this patient presents for reevaluation here in the clinic. This is a wound that actually we previously took care of. She had 1 on the right ankle and the left the left turned out to be be harder due to to heal but nonetheless is doing great at this point as the right that has reopened and it was noted first just several weeks ago with a scab over it and came off in just the past few days. Fortunately there does not appear to be any obvious evidence of significant active infection at this time which is great news. No fevers, chills, nausea, vomiting, or diarrhea. The patient does have a history of pacemaker along with being on Eliquis currently  as well. There does not appear to be any signs of this interfering in any way with her wound. She does have swelling we previously had compression socks for her ordered but again it does not look like she wears these on a regular basis by any means. 05/26/2021 upon evaluation today patient appears to be doing well with regard to her wound which is actually showing signs of excellent improvement. There does not appear to be any signs of active infection which is great news and overall very pleased with where things stand today. No fevers, chills, nausea, vomiting, or diarrhea. 06/02/2021 upon evaluation today patient's wound actually showing signs of excellent improvement. Fortunately there does not appear to be any signs of active infection which is great news. I think the patient is making good progress with regard to her wounds in general. 06/09/2021 upon evaluation today patient appears to be doing excellent in regard to her wounds currently. Fortunately there does not appear to be any signs of active infection which is great news. No fevers, chills, nausea, vomiting, or diarrhea. Overall extremely pleased with where things stand today. I think the patient is making excellent progress. 06/16/2021 upon evaluation today patient appears to be doing  well in regard to her wound. This is going require little bit of debridement today and that was discussed with the patient. Otherwise she seems to be doing quite well and I am actually very pleased with where things stand at this point. No fevers, chills, nausea, vomiting, or diarrhea. 06/23/2021 upon evaluation today patient appears to be doing well with regard to her wounds. She has been tolerating the dressing changes without complication. Fortunately there does not appear to be any evidence of infection and she has not had air in her home which she actually lives at an assisted living that got fixed this morning. With that being said because of that her wrap  has been extremely hot and bothersome for her over the past week. 06/30/2021 upon evaluation today patient is actually making excellent progress in regard to her ankle ulcer. She has been tolerating the dressing changes without complication and overall extremely pleased with where things stand there does not appear to be any evidence of active infection which is great news. No fevers, chills, nausea, vomiting, or diarrhea. 07/07/21 upon evaluation today patients and culture on the right actually appears to be doing quite well. There does not appear to be any signs of infection and overall very pleased with where things stand today. No fevers, chills, nausea, or vomiting noted at this time. 07/14/2021 unfortunately the patient today has some evidence of deep tissue injury and pressure getting to the ankle region. Again I am not exactly sure what is going on here but this is very similar to issues that we have had in the past. I explained to the patient that she needs to be very mindful of exactly what is happening I think sleeping in bed is probably the main issue here although there could be other culprits I am not sure what else would potentially lead to this kind of a problem for her. 07/21/2021 upon evaluation today patient's wound actually showing signs of improvement compared to last week. Fortunately there does not appear to be any signs of active infection which is great news and overall very pleased with where things stand in that regard. With that being said I do believe that she is continuing to show signs of overall of getting better although I think this is still basically about what we were 2 weeks ago due to the worsening and now improvement. 07/28/2021 upon evaluation today patient appears to be doing well with regard to her wound. She does have some slough buildup on the surface of the wound which I would have to manage today. Fortunately there is no sign of active infection at this time.  No fevers, chills, nausea, vomiting, or diarrhea. 08/04/2021 upon evaluation today patient appears to be doing about the same in regard to her wound. To be perfectly honest I am beginning to be a little bit concerned about the overall appearance of the wound bed. I do think possibly taking a sample right around the margin of the wound could be beneficial for her as far as identifying anything such as an inflammatory process or to be honest even a skin tag cancer type process that may be of concern here. Fortunately there does not appear to be any evidence of active infection at this time which is great news she is not having any pain also great news. 08/11/2021 upon evaluation today patient appears to be doing well with regard to her wound. The good news is I did review her biopsy results and it showed  some inflammatory mixed findings but nothing that appeared to be malignant which is great news. Overall this is more of a chronic venous stasis type issue which again is more what we have been treating. Nonetheless I just wanted to make sure before going forward that there was not anything more untoward going on at this point. 08/18/2021 upon evaluation today patient appears to be doing well with regard to her ankle ulcer. Fortunately there does not appear to be any signs of active infection at this time which is great overall wound is dramatically improved compared to last week. Since last week I have actually placed her on doxycycline and subsequently this is a good option as far as the findings are concerned at this point. 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 the doxycycline is doing that. There does not appear to be any evidence of active infection systemically which is great news. 08/25/2021 upon evaluation today patient appears to be doing well with regard to her wound. I  feel like we are finally get back on track as far as healing is concerned I am much happier with the overall appearance today. I do think that she is tolerating the dressing changes without complication which is great news. We have been using Hydrofera Blue which I think is a good option. The good news is she is also doing great in regard to her compression sock on the left which is a zipper compression that seems to be doing a great job keeping her edema under good control. 09/01/2021 upon evaluation today patient appears to be doing well with regard to her wound. Infection seems to be under much better control which is great news and very pleased in that regard. Fortunately there does not appear to be any signs of infection currently. [name], [name] ([id number]) 09/08/2021 upon evaluation today patient actually appears to be making good progress in regard to her wound. She has been tolerating the dressing changes without complication. Fortunately there does not appear to be any evidence of active infection at this time which is great news as well. No fevers, chills, nausea, vomiting, or diarrhea. 09/22/2021 upon evaluation today patient appears to be doing well with regard to her wound although is very slow to heal. We have not looked into Apligraf yet I think that is something that we should see about doing. 09/29/2021 upon evaluation today patient's wound is actually showing signs of doing about the same. I am not seeing any evidence of worsening but also no significant evidence of improvement. We did gain approval for the organogenesis products all except for Apligraf as covered by her insurance. With that being said I do think that we can go ahead and proceed with the NuShield if the patient and her family in agreement of the plan I discussed that with him today she does have a 20% coinsurance which we also discussed. 10/06/2021 upon evaluation today patient appears to unfortunately be doing  a little bit worse she appears to be infected based on what I am seeing. Fortunately there does not appear to be any signs of active infection at this time which is great news. Unfortunately it does appear to be some evidence of around the wound edge indicated by way of erythema and warmth as well as redness 10/13/2021 upon evaluation today patient actually appears to be doing excellent  in regard to her ankle ulcer compared to what it was. 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 I think were fine to go ahead and apply the NuShield today. 10/20/2021 upon evaluation today patient appears to be doing excellent in fact the NuShield seems to have done all some for her thus far. I am actually very pleased with where we stand and overall I think that she is making great progress. There is no evidence of active infection at this time. 11/23; patient presents for follow-up. She has no issues or complaints today. She denies signs of infection. She reports stability in her wound healing. 11/03/2021 upon evaluation today patient appears to be doing well with regard to her wounds. She has been tolerating the dressing changes without complication. Fortunately there is no signs of active infection at this time. 11/10/2021 upon evaluation today patient appears to be doing well with regard to her right ankle which is actually showing signs of improvement with the NuShield I am very pleased. Subsequently in regards to the left ankle this appears to be doing okay with no evidence of issue here either. With that being said she has a significant contusion on the left leg from having fallen 2 days ago. She tells me that she was using her walker going to the closet and then when she got to the closet turned around to get  something out at which point she fell according to the story. Nonetheless she does have a hematoma just below her knee on the anterior portion of her shin. My hope is that this will not open until wound although we do need I think Some compression over it also think that we need to have her use an ice as well to help with the swelling and prevent this from getting worse. The last thing she needs is a wound opened up here. 11/17/2021 upon evaluation patient's right ankle actually showing signs of improvement based on what I see currently there is a lot of new skin growth coming in which is great news. Nonetheless I do feel like that the patient is showing improvement as well in regard to her left leg with the use of the Tubigrip it swollen but not as bruised as what I would have expected after what I was seeing last week. Nonetheless I do think doubling up on the Tubigrip would probably be beneficial to try to keep some of the edema under better control here. 11/24/2021 upon evaluation today patient appears to be doing excellent in regard to her wound. This is actually measuring significantly smaller which is great news. Fortunately I do not see any signs of active infection locally nor systemically at this point. Overall I am very pleased with how the NuShield is doing. 12/30; wound bed looks healthy however not much change in overall wound volume. We applied Nushield again today in the standard fashion 12/08/2021 upon evaluation today patient's wound actually showing signs of good improvement and I am actually very pleased with where we stand today as well. I do not see any evidence of active infection locally nor systemically which is great news. Unfortunately she has been having some issues with stomach upset and diarrhea today. 12/15/2021 upon evaluation today patient appears to be doing excellent in regard to her wound. There is  a little bit of dry skin raised up around the edges of the wound but  fortunately nothing too significant at this point. Fortunately I do not see any evidence of active infection either which is great news. No fevers, chills, nausea, vomiting, or diarrhea. 12/29/2021 upon evaluation today patient appears to be doing well with regard to her wound. In general I feel like she is getting very close to complete resolution. I think that she is making good progress here as well. Overall I do not see any signs of infection and very little of this appears to actually be open. 01/12/2022 upon evaluation today patient appears to be doing a little bit worse in regard to her wound. It appears that the collagen actually trapped fluid underneath the collagen which got hard and subsequently caused the fluid to back up. This patient has made the wound appear to be larger than what it was previous. With that being said I do not see any signs of obvious infection is not warm to touch and not significantly erythematous either which is good news. Nonetheless I do think that we will need to continue to keep an eye on this. Probably not go put on antibiotic this week but I will be too far from doing so she is actually on a Z-Pak right now I do not want to really double up on antibiotics. Electronic Signature(s) Signed: 01/12/2022 11:27:44 AM By: [name] Keeler PA-C Entered By: [name] on 01/12/2022 11:27:44 Baillie, [name] ([id number]) -------------------------------------------------------------------------------- Physical Exam Details Patient Name: [name] Date of Service: 01/12/2022 10:15 AM Medical Record Number: [id number] Patient Account Number: [id number] Date of Birth/Sex: Aug 19, 1923 (88 y.o. F) Treating RN: [name] Primary Care Provider: Adrian [name] Other Clinician: Referring Provider: Adrian [name] Treating Provider/Extender: [name] in Treatment: 62 Constitutional Well-nourished and well-hydrated in no acute  distress. Respiratory normal breathing without difficulty. Psychiatric this patient is able to make decisions and demonstrates good insight into disease process. Alert and Oriented x 3. pleasant and cooperative. Notes Upon inspection patient's wound bed actually showed signs of good granulation and epithelization at this point in some areas I was able to remove some of the callus which was thickened around the edges of the wound postdebridement this appears to be doing much better which is great news. I do not see any signs of active infection locally or systemically at this point. Electronic Signature(s) Signed: 01/12/2022 11:29:20 AM By: [name] Keeler PA-C Entered By: [name] on 01/12/2022 11:29:20 [name], [name] ([id number]) -------------------------------------------------------------------------------- Physician Orders Details Patient Name: [name] Date of Service: 01/12/2022 10:15 AM Medical Record Number: [id number] Patient Account Number: [id number] Date of Birth/Sex: Nov 08, 1923 (88 y.o. 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 Bathing/ Shower/ Hygiene o May shower with wound dressing protected with water repellent cover or cast protector. Edema Control - Lymphedema / Segmental Compressive Device / Other o Optional: One layer of unna paste to top of compression wrap (to act as an anchor). - and at toes o Patient to wear own compression stockings.  Remove compression stockings every night  before going to bed and put on every morning when getting up. - left leg o Elevate, Exercise Daily and Avoid Standing for Long Periods of Time. o Elevate legs to the level of the heart and pump ankles as often as possible o Elevate leg(s) parallel to the floor when sitting. Wound Treatment Wound #7 - Malleolus Wound Laterality: Right, Lateral Cleanser: Soap and Water 1 x Per Week/30 Days Discharge Instructions: Gently cleanse wound with antibacterial soap, rinse and pat dry prior to dressing wounds Primary Dressing: Algicell Calcium Alginate Dressing 2x2 (in/in) 1 x Per Week/30 Days Primary Dressing: Medihoney - Gel, 1.5 (oz), tube 1 x Per Week/30 Days Secondary Dressing: Gauze 1 x Per Week/30 Days Discharge Instructions: As directed: dry, moistened with saline or moistened with Dakins Solution Compression Wrap: Profore Lite LF 3 Multilayer Compression Bandaging System 1 x Per Week/30 Days Discharge Instructions: Apply 3 multi-layer wrap as prescribed. Electronic Signature(s) Signed: 01/12/2022 5:14:54 PM By: [name] Keeler PA-C Signed: 01/13/2022 9:21:59 AM By: [name] Coria RN Entered By: [name] on 01/12/2022 10:51:46 Mimnaugh, [name] ([id number]) -------------------------------------------------------------------------------- Problem List Details Patient Name: [name] Date of Service: 01/12/2022 10:15 AM Medical Record Number: [id number] Patient Account Number: [id number] Date of Birth/Sex: 11-Oct-1923 (88 y.o. 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, stage 3 05/13/2021 No Yes I89.0 Lymphedema, not elsewhere classified 05/13/2021 No Yes I87.2 Venous insufficiency  (chronic) (peripheral) 05/13/2021 No Yes Z95.0 Presence of cardiac pacemaker 05/13/2021 No Yes Z79.01 Long term (current) use of anticoagulants 05/13/2021 No Yes Inactive Problems ICD-10 Code Description Active Date Inactive Date S80.12XA Contusion of left lower leg, initial encounter 11/10/2021 11/10/2021 Resolved Problems Electronic Signature(s) Signed: 01/12/2022 10:16:45 AM By: [name] Keeler PA-C Entered By: [name] on 01/12/2022 10:16:45 Luber, [name] ([id number]) -------------------------------------------------------------------------------- Progress Note Details Patient Name: [name] Date of Service: 01/12/2022 10:15 AM Medical Record Number: [id number] Patient Account Number: [id number] Date of Birth/Sex: [birth year] (88 y.o. 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 Dr. Amalia [name] the podiatrist saw her on 07/20/2017 and sent her to the wound center for possible hyperbaric oxygen therapy. past medical history of peripheral vascular disease, varicose veins, status post appendectomy, basal cell carcinoma excision from the left leg, cholecystectomy, pacemaker placement, right lower extremity angiography done by [name] in March 2017 with placement of a stent. there is also note of a successful ablation of the right small saphenous vein done which was reviewed by ultrasound on 10/24/2016. the patient had a right small saphenous vein ablation done on 10/20/2016. The patient has never been a smoker. She has been seen by  Dr. Corene Cornea [name] the vascular surgeon who most recently saw her on 06/15/2017 for evaluation of ongoing problems with right leg swelling. She had a lower extremity arterial duplex examination done(02/13/17) which showed patent distal right superficial femoral artery stent and above-the-knee popliteal stent without evidence of restenosis. The ABI was more than 1.3 on the right  and more than 1.3 on the left. This was consistent with noncompressible arteries due to medial calcification. The right great toe pressure and PPG waveforms are within normal limits and the left great toe pressure and PPG waveforms are decreased. he recommended she continue to wear her compression stockings and continue with elevation. She is scheduled to have a noninvasive arterial study in the near future 08/16/2017 -- had a lower extremity arterial duplex examination done which showed patent distal right superficial femoral artery stent and above-the-knee popliteal stent without evidence of restenosis. The ABI was more than 1.3 on the right and more than 1.3 on the left. This was consistent with noncompressible arteries due to medial calcification. The right great toe pressure and PPG waveforms are within normal limits and the left great toe pressure and PPG waveforms are decreased. the x-ray of the right ankle has not yet been done 08/24/2017 -- had a right ankle x-ray -- IMPRESSION:1. No fracture, bone lesion or evidence of osteomyelitis. 2. Lateral soft tissue swelling with a soft tissue ulcer. she has not yet seen the vascular surgeon for review 08/31/17 on evaluation today patient's wound appears to be showing signs of improvement. She still with her appointment with vascular in order to review her results of her vascular study and then determine if any intervention would be recommended at that time. No fevers, chills, nausea, or vomiting noted at this time. She has been tolerating the dressing changes without  complication. 09/28/17 on evaluation today patient's wound appears to show signs of good improvement in regard to the granulation tissue which is surfacing. There is still a layer of slough covering the wound and the posterior portion is still significantly deeper than the anterior nonetheless there has been some good sign of things moving towards the better. She is going to go back to [name] for reevaluation to ensure her blood flow is still appropriate. That will be before her next evaluation with [place] next week. No fevers, chills, nausea, or vomiting noted at this time. Patient does have some discomfort rated to be a 3-4/10 depending on activity specifically cleansing the wound makes it worse. 10/05/2017 -- the patient was seen by Dr. Lucky Cowboy last week and noninvasive studies showed a normal right ABI with brisk triphasic waveforms consistent with no arterial insufficiency including normal digital pressures. The duplex showed a patent distal right SFA stent and the proximal SFA was also normal. He was pleased with her test and thought she should have enough of perfusion for normal wound healing. He would see her back in 6 months time. 12/21/17 on evaluation today patient appears to be doing fairly well in regard to her right lateral ankle wound. Unfortunately the main issue that she is expansion at this point is that she is having some issues with what appears to be some cellulitis in the right anterior shin. She has also been noting a little bit of uncomfortable feeling especially last night and her ankle area. I'm afraid that she made the developing a little bit of an infection. With that being said I think it is in the early stages. 12/28/17 on evaluation today patient's ankle appears to be doing excellent. She's making good progress at this point the cellulitis seems to have improved after last week's evaluation. Overall she is having no significant discomfort which is excellent news. She does have an  appointment with [name] on March 29, 2018 for reevaluation in regard to the stent he placed. She seems to have  excellent blood flow in the right lower extremity. 01/19/12 on evaluation today patient's wound appears to be doing very well. In fact she does not appear to require debridement at this point, there's no evidence of infection, and overall from the standpoint of the wound she seems to be doing very well. With that being said I believe that it may be time to switch to different dressing away from the [facility] Dressing she tells me she does have a lot going on her friend actually passed away yesterday and she's also having a lot of issues with her husband this obviously is weighing heavy on her as far as your thoughts and concerns today. 01/25/18 on evaluation today patient appears to be doing fairly well in regard to her right lateral malleolus. She has been tolerating the dressing changes without complication. Overall I feel like this is definitely showing signs of improvement as far as how the overall appearance of the wound is there's also evidence of epithelium start to migrate over the granulation tissue. In general I think that she is progressing nicely as far as the wound is concerned. The only concern she really has is whether or not we can switch to every other week visits in order to avoid having as many KARISMA, MEISER ([id number]) appointments as her daughters have a difficult time getting her to her appointments as well as the patient's husband to his he is not doing very well at this point. 02/22/18 on evaluation today patient's right lateral malleolus ulcer appears to be doing great. She has been tolerating the dressing changes without complication. Overall you making excellent progress at this time. Patient is having no significant discomfort. 03/15/18 on evaluation today patient appears to be doing much more poorly in regard to her right lateral ankle ulcer at this  point. Unfortunately since have last seen her her husband has passed just a few days ago is obviously weighed heavily on her her daughter also had surgery well she is with her today as usual. There does not appear to be any evidence of infection she does seem to have significant contusion/deep tissue injury to the right lateral malleolus which was not noted previous when I saw her last. It's hard to tell of exactly when this injury occurred although during the time she was spending the night in the hospital this may have been most likely. 03/22/18 on evaluation today patient appears to actually be doing very well in regard to her ulcer. She did unfortunately have a setback which was noted last week however the good news is we seem to be getting back on track and in fact the wound in the core did still have some necrotic tissue which will be addressed at this point today but in general I'm seeing signs that things are on the up and up. She is glad to hear this obviously she's been somewhat concerned that due to the how her wound digressed more recently. 03/29/18 on evaluation today patient appears to be doing fairly well in regard to her right lower extremity lateral malleolus ulcer. She unfortunately does have a new area of pressure injury over the inferior portion where the wound has opened up a little bit larger secondary to the pressure she seems to be getting. She does tell me sometimes when she sleeps at night that it actually hurts and does seem to be pushing on the area little bit more unfortunately. There does not appear to be any evidence of infection which is  good news. She has been tolerating the dressing changes without complication. She also did have some bruising in the left second and third toes due to the fact that she may have bump this or injured it although she has neuropathy so she does not feel she did move recently that may have been where this came from. Nonetheless there does not  appear to be any evidence of infection at this time. 04/12/18 on evaluation today patient's wound on the right lateral ankle actually appears to be doing a little bit better with a lot of necrotic docking tissue centrally loosening up in clearing away. However she does have the beginnings of a deep tissue injury on the left lateral malleolus likely due to the fact we've been trying offload the right as much as we have. I think she may benefit from an assistive soft device to help with offloading and it looks like they're looking at one of the doughnut conditions that wraps around the lower leg to offload which I think will definitely do a good job. With that being said I think we definitely need to address this issue on the left before it becomes a wound. Patient is not having significant pain. 04/19/18 on evaluation today patient appears to be doing excellent in regard to the progress she's made with her right lateral ankle ulcer. The left ankle region which did show evidence of a deep tissue injury seems to be resolving there's little fluid noted underneath and a blister there's nothing open at this point in time overall I feel like this is progressing nicely which is good news. She does not seem to be having significant discomfort at this point which is also good news. 04/25/18-She is here in follow up evaluation for bilateral lateral malleolar ulcers. The right lateral malleolus ulcer with pale subcutaneous tissue exposure, central area of ulcer with tendon/periosteum exposed. The left lateral malleolus ulcer now with central area of nonviable tissue, otherwise deep tissue injury. She is wearing compression wraps to the left lower extremity, she will place the right lower extremity compression wraps on when she gets home. She will be out of town over the weekend and return next week and follow-up appointment. She completed her doxycycline this morning 05/03/18 on evaluation today patient appears to  be doing very well in regard to her right lateral ankle ulcer in general. At least she's showing some signs of improvement in this regard. Unfortunately she has some additional injury to the left lateral malleolus region which appears to be new likely even over the past several days. Again this determination is based on the overall appearance. With that being said the patient is obviously frustrated about this currently. 05/10/18-She is here in follow-up evaluation for bilateral lateral malleolar ulcers. She states she has purchased offloading shoes/boots and they will arrive tomorrow. She was asked to bring them in the office at next week's appointment so her provider is aware of product being utilized. She continues to sleep on right or left side, she has been encouraged to sleep on her back. The right lateral malleolus ulcer is precariously close to peri-osteum; will order xray. The left lateral malleolus ulcer is improved. Will switch back to santyl; she will follow up next week. 05/17/18 on evaluation today patient actually appears to be doing very well in regard to her malleolus her ulcers compared to last time I saw them. She does not seem to have as much in the way of contusion at this point which is  great news. With that being said she does continue to have discomfort and I do believe that she is still continuing to benefit from the offloading/pressure reducing boots that were recommended. I think this is the key to trying to get this to heal up completely. 05/24/18 on evaluation today patient actually appears to be doing worse at this point in time unfortunately compared to her last week's evaluation. She is having really no increased pain which is good news unfortunately she does have more maceration in your theme and noted surrounding the right lateral ankle the left lateral ankle is not really is erythematous I do not see signs of the overt cellulitis on that side. Unfortunately the wounds do  not seem to have shown any signs of improvement since the last evaluation. She also has significant swelling especially on the right compared to previous some of this may be due to infection however also think that she may be served better while she has these wounds by compression wrapping versus continuing to use the Juxta-Lite for the time being. Especially with the amount of drainage that she is experiencing at this point. No fevers, chills, nausea, or vomiting noted at this time. 05/31/18 on evaluation today patient appears to actually be doing better in regard to her right lateral lower extremity ulcer specifically on the malleolus region. She has been tolerating the antibiotic without complication. With that being said she still continues to have issues but a little bit of redness although nothing like she what she was experiencing previous. She still continues to pressure to her ankle area she did get the problem on offloading boots unfortunately she will not wear them she states there too uncomfortable and she can't get in and out of the bed. Nonetheless at this point her wounds seem to be continually getting worse which is not what we want I'm getting somewhat concerned about her progress and how things are going to proceed if we do not intervene in some way shape or form. I therefore had a very lengthy conversation today about offloading yet again and even made a specific suggestion for switching her to a memory foam mattress and even gave the information for a specific one that they could look at getting if it was something that they were interested in considering. She does not want to be considered for a hospital bed air mattress although honestly insurance would not cover it that she does not have any wounds on her trunk. 06/14/18 on evaluation today both wounds over the bilateral lateral malleolus her ulcers appear to be doing better there's no evidence of pressure injury at this point. She  did get the foam mattress for her bed and this does seem to have been extremely beneficial for her in my pinion. Her daughter states that she is having difficulty getting out of bed because of how soft it is. The patient also relates this to be. Nonetheless I do feel like she's actually doing better. Unfortunately right after and around the time she was getting the mattress she also sustained a fall when she got up to go pick up the phone and ended up injuring her right elbow she has 18 sutures in place. We are not caring for this currently although home health is going to be taking the sutures out shortly. Nonetheless this may be something that we need to evaluate going forward. It depends on [name], [name] ([id number]) how well it has or has not healed in the end. She  also recently saw an orthopedic specialist for an injection in the right shoulder just before her fall unfortunately the fall seems to have worsened her pain. 06/21/18 on evaluation today patient appears to be doing about the same in regard to her lateral malleolus ulcers. Both appear to be just a little bit deeper but again we are clinging away the necrotic and dead tissue which I think is why this is progressing towards a deeper realm as opposed to improving from my measurement standpoint in that regard. Nonetheless she has been tolerating the dressing changes she absolutely hates the memory foam mattress topper that was obtained for her nonetheless I do believe this is still doing excellent as far as taking care of excess pressure in regard to the lateral malleolus regions. She in fact has no pressure injury that I see whereas in weeks past it was week by week I was constantly seeing new pressure injuries. Overall I think it has been very beneficial for her. 07/03/18; patient arrives in my clinic today. She has deep punched out areas over her bilateral lateral malleoli. The area on the right has some more depth. We spent a lot of  time today talking about pressure relief for these areas. This started when her daughter asked for a prescription for a memory foam mattress. I have never written a prescription for a mattress and I don't think insurances would pay for that on an ordinary bed. In any case he came up that she has foam boots that she refuses to wear. I would suggest going to these before any other offloading issues when she is in bed. They say she is meticulous about offloading this the rest of the day 07/10/18- She is seen in follow-up evaluation for bilateral, lateral malleolus ulcers. There is no improvement in the ulcers. She has purchased and is sleeping on a memory foam mattress/overlay, she has been using the offloading boots nightly over the past week. She has a follow up appointment with vascular medicine at the end of October, in my opinion this follow up should be expedited given her deterioration and suboptimal TBI results. We will order plain film xray of the left ankle as deeper structures are palpable; would consider having MRI, regardless of xray report(s). The ulcers will be treated with iodoflex/iodosorb, she is unable to safely change the dressings daily with santyl. 07/19/18 on evaluation today patient appears to be doing in general visually well in regard to her bilateral lateral malleolus ulcers. She has been tolerating the dressing changes without complication which is good news. With that being said we did have an x-ray performed on 07/12/18 which revealed a slight loosen see in the lateral portion of the distal left fibula which may represent artifact but underline lytic destruction or osteomyelitis could not be excluded. MRI was recommended. With that being said we can see about getting the patient scheduled for an MRI to further evaluate this area. In fact we have that scheduled currently for August 20 19,019. 07/26/18 on evaluation today patient's wound on the right lateral ankle actually appears to  be doing fairly well at this point in my pinion. She has made some good progress currently. With that being said unfortunately in regard to the left lateral ankle ulcer this seems to be a little bit more problematic at this time. In fact as I further evaluated the situation she actually had bone exposed which is the first time that's been the case in the bone appear to be necrotic.  Currently I did review patient's note from Dr. Bunnie Domino office with [facility]. He stated that ABI was 1.26 on the right and 0.95 on the left with good waveforms. Her perfusion is stable not reduced from previous studies and her digital waveforms were pretty good particularly on the right. His conclusion upon review of the note was that there was not much she could do to improve her perfusion and he felt she was adequate for wound healing. His suggestion was that she continued to see [place] and consider a synthetic skin graft if there was no underlying infection. He plans to see her back in six months or as needed. 08/01/18 on evaluation today patient appears to be doing better in regard to her right lateral ankle ulcer. Her left lateral ankle ulcer is about the same she still has bone involvement in evidence of necrosis. There does not appear to be evidence of infection at this time On the right lateral lower extremity. I have started her on the Augmentin she picked this up and started this yesterday. This is to get her through until she sees infectious disease which is scheduled for 08/12/18. 08/06/18 on evaluation today patient appears to be doing rather well considering my discussion with patient's daughter at the end of last week. The area which was marked where she had erythema seems to be improved and this is good news. With that being said overall the patient seems to be making good improvement when it comes to the overall appearance of the right lateral ankle ulcer although this has been slow she at  least is coming around in this regard. Unfortunately in regard to the left lateral ankle ulcer this is osteomyelitis based on the pathology report as well is bone culture. Nonetheless we are still waiting CT scan. Unfortunately the MRI we originally ordered cannot be performed as the patient is a pacemaker which I had overlooked. Nonetheless we are working on the CT scan approval and scheduling as of now. She did go to the hospital over the weekend and was placed on IV Cefzo for a couple of days. Fortunately this seems to have improved the erythema quite significantly which is good news. There does not appear to be any evidence of worsening infection at this time. She did have some bleeding after the last debridement therefore I did not perform any sharp debridement in regard to left lateral ankle at this point. Patient has been approved for a snap vac for the right lateral ankle. 08/14/18; the patient with wounds over her bilateral lateral malleoli. The area on the right actually looks quite good. Been using a snap back on this area. Healthy granulation and appears to be filling in. Unfortunately the area on the left is really problematic. 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 Also noted to have cellulitis. She saw Dr. Novella Olive of infectious disease today and was put on linezolid. We are able to verify this with her pharmacy. She is completed the Augmentin that she was already on. We've been using Iodoflex to this area 08/23/18 on evaluation today patient's wounds both actually appear to be doing better compared to my prior evaluations. Fortunately she showing signs of good improvement in regard to the overall wound status especially where were using the snap vac on the right. In regard to left lateral malleolus the wound bed actually appears to be much cleaner than previously noted.  I do not feel any phone directly  probed during evaluation today and though there is tendon noted this does not appear to be necrotic it's actually fairly good as far as the overall appearance of the tendon is concerned. In general the wound bed actually appears to be doing significantly better than it was previous. Patient is currently in the care of [name] and I did review that note today. He actually has her on two weeks of linezolid and then following the patient will be on 1-2 months of Keflex. That is the plan currently. She has been on antibiotics therapy as prescribed by myself initially starting on July 30, 2018 and has been on that continuously up to this point. 08/30/18 on evaluation today patient actually appears to be doing much better in regard to her right lateral malleolus ulcer. She has been tolerating the dressing changes specifically the snap vac without complication although she did have some issues with the seal currently. Apparently there was some trouble with getting it to maintain over the past week past Sunday. Nonetheless overall the wound appears better in regard to the right lateral malleolus region. In regard to left lateral malleolus this actually show some signs of additional granulation although there still tendon noted in the base of the wound this appears to be healthy not necrotic in any way whatsoever. We are considering potentially using a snap vac for the left lateral malleolus as well the product wrap from [employer], [place], was present in the clinic today we're going to see this patient I did have her come in with me after obtaining consent from the patient and her daughter in order to look at the wound and see if there's any recommendation one way or another as to whether or not they felt the snapback could be beneficial for the left lateral malleolus region. But [name], [name] ([id number]) the conclusion was that it might be but that this is definitely a little bit deeper wound than what  traditionally would be utilized for a snap vac. 09/06/18 on evaluation today patient actually appears to be doing excellent in my pinion in regard to both ankle ulcers. She has been tolerating the dressing changes without complication which is great news. Specifically we have been using the snap vac. In regard to the right ankle I'm not even sure that this is going to be necessary for today and following as the wound has filled in quite nicely. In regard to the left ankle I do believe that we're seeing excellent epithelialization from the edge as well as granulation in the central portion the tendon is still exposed but there's no evidence of necrotic bone and in general I feel like the patient has made excellent progress even compared to last week with just one week of the snap vac. 09/11/18; this is a patient who has wounds on her bilateral lateral malleoli. Initially both of these were deep stage IV wounds in the setting of chronic arterial insufficiency. She has been revascularized. As I understand think she been using snap vacs to both of these wounds however the area on the right became more superficial and currently she is only using it on the left. Using silver collagen on the right and silver collagen under the back on the left I believe 09/19/18 on evaluation today patient actually appears to be doing very well in regard to her lateral malleolus or ulcers bilaterally. She has been tolerating the dressing changes without complication. Fortunately there does not  appear to be any evidence of infection at this time. Overall I feel like she is improving in an excellent manner and I'm very pleased with the fact that everything seems to be turning towards the better for her. This has obviously been a long road. 09/27/18 on evaluation today patient actually appears to be doing very well in regard to her bilateral lateral malleolus ulcers. She has been tolerating the dressing changes without  complication. Fortunately there does not appear to be any evidence of infection at this time which is also great news. No fevers, chills, nausea, or vomiting noted at this time. Overall I feel like she is doing excellent with the snap vac on the left malleolus. She had 40 mL of fluid collection over the past week. 10/04/18 on evaluation today patient actually appears to be doing well in regard to her bilateral lateral malleolus ulcers. She continues to tolerate the dressing changes without complication. One issue that I see is the snap vac on the left lateral malleolus which appears to have sealed off some fluid underlying this area and has not really allowed it to heal to the degree that I would like to see. For that reason I did suggest at this point we may want to pack a small piece of packing strip into this region to allow it to more effectively wick out fluid. 10/11/18 in general the patient today does not feel that she has been doing very well. She's been a little bit lethargic and subsequently is having bodyaches as well according to what she tells me today. With that being said overall she has been concerned with the fact that something may be worsening although to be honest her wounds really have not been appearing poorly. She does have a new ulcer on her left heel unfortunately. This may be pressure related. Nonetheless it seems to me to have potentially started at least as a blister I do not see any evidence of deep tissue injury. In regard to the left ankle the snap vac still seems to be causing the ceiling off of the deeper part of the wound which is in turn trapping fluid. I'm not extremely pleased with the overall appearance as far as progress from last week to this week therefore I'm gonna discontinue the snap vac at this point. 10/18/18 patient unfortunately this point has not been feeling well for the past several days. She was seen by Grayland Ormond her primary care provider who is a  Librarian, academic at [facility]. Subsequently she states that she's been very weak and generally feeling malaise. No fevers, chills, nausea, or vomiting noted at this time. With that being said bloodwork was performed at the PCP office on the 11th of this month which showed a white blood cell count of 10.7. This was repeated today and shows a white blood cell count of 12.4. This does show signs of worsening. Coupled with the fact that she is feeling worse and that her left ankle wound is not really showing signs of improvement I feel like this is an indication that the osteomyelitis is likely exacerbating not improving. Overall I think we may also want to check her C-reactive protein and sedimentation rate. Actually did call [facility] Fleet office this afternoon while the patient was in the office here with me. Subsequently based on the findings we discussed treatment possibilities and I think that it is appropriate for [place] to go ahead and initiate treatment with doxycycline which I'm going to do. Subsequently  he did agree to see about adding a CRP and sedimentation rate to her orders. If that has not already been drawn to where they can run it they will contact the patient she can come back to have that check. They are in agreement with plan as far as the patient and her daughter are concerned. Nonetheless also think we need to get in touch with Dr. Henreitta Leber office to see about getting the patient scheduled with him as soon as possible. 11/08/18 on evaluation today patient presents for follow-up concerning her bilateral foot and ankle ulcers. I did do an extensive review of her chart in epic today. Subsequently she was seen by [name] he did initiate Cefepime IV antibiotic therapy. Subsequently she had some issues with her PICC line this had to be removed because it was coiled and then replaced. Fortunately that was now settled. Unfortunately she has continued have issues with her left heel as  well as the issues that she is experiencing with her bilateral lateral malleolus regions. I do believe however both areas seem to be doing a little bit better on evaluation today which is good news. No fevers, chills, nausea, or vomiting noted at this time. She actually has an angiogram schedule with [name] on this coming Monday, November 11, 2018. Subsequently the patient states that she is feeling much better especially than what she was roughly 2 weeks ago. She actually had to cancel an appointment because she was feeling so poorly. No fevers, chills, nausea, or vomiting noted at this time. 11/15/18 on evaluation today patient actually is status post having had her angiogram with [name] Monday, four days ago. 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 He had to go and work on several areas of the vasculature fortunately he was able to obtain no more than a 30% residual stenosis throughout post procedure. I reviewed this note today. I think this will definitely help with healing at this time. Fortunately there does not appear to be any signs of infection and I do feel like ratio already has a better appearance to it. 11/22/18 upon evaluation today patient actually appears to be doing very well in regard to her wounds in general. The right lateral malleolus looks excellent the heel looks better in the left lateral malleolus also appears to be doing a little better. With that being said the right second toe actually appears to be open and training we been watching this is been dry and stable but now is open. 12/03/2018 Seen today for follow-up and management of multiple bilateral lower extremity wounds. New pressure injury of the great toe which is closed at this time. Wound of the right distal second toe appears larger today with deep undermining and a pocket of fluid present within the undermining region. Left and right malleolus is wounds are stable today with no signs and  symptoms of infection.Denies any needs or concerns during exam today. 12/13/18 on evaluation today patient appears to be doing somewhat better in regard to her left heel ulcer. She also seems to be completely healed in regard to the right lateral malleolus ulcer. The left malleolus ulcer is smaller what unfortunately the wounds which are new over the first and second toes of the right foot are what are most concerning at this point especially the second. Both areas did require sharp debridement today. 12/20/18 on evaluation today patient's wound actually appears to be doing better in regard to left  lateral ankle and her right lateral ankle continues to remain healed. The hill ulcer on the left is improved. She does have improvement noted as well in regard to both toe ulcers. Overall I'm very pleased in this regard. No fevers, chills, nausea, or vomiting noted at this time. [name], [name] ([id number]) 12/23/18 on evaluation today patient is seen after she had her toenails trimmed at the podiatrist office due to issues with her right great toe. There was what appeared to be dark eschar on the surface of the wound which had her in the podiatrist concerned. Nonetheless as I remember that during the last office visit I had utilize silver nitrate of this area I was much less concerned about the situation. Subsequently I was able to clean off much of this tissue without any complication today. This does not appear to show any signs of infection and actually look somewhat better compared to last time post debridement. Her second toe on the right foot actually had callous over and there did appear still be some fluid underneath this that would require debridement today. 12/27/18 on evaluation today patient actually appears to be showing signs of improvement at all locations. Even the left lateral ankle although this is not quite as great as the other sites. Fortunately there does not appear to be any signs of  infection at this time and both of her toes on the right foot seem to be showing signs of improvement which is good news and very pleased in this regard. 01/03/19 on evaluation today patient appears to be doing better for the most part in regard to her wounds in particular. There does not appear to be any evidence of infection at this time which is good news. Fortunately there is no sign of really worsening anywhere except for the right great toe which she does have what appears to be a bruise/deep tissue injury which is very superficial and already resolving. I'm not sure where this came from I questioned her extensively and she does not recall what may have happened with this. Other than that the patient seems to be doing well even the left lateral ankle ulcer looks good and is getting smaller. 01/10/19 on evaluation today patient appears to be doing well in regard to her left heel wound and both of her toe wounds. Overall I feel like there is definitely improvement here and I'm happy in that regard. With that being said unfortunately she is having issues with the left lateral malleolus ulcer which unfortunately still has a lot of depth to it. This is gonna be a very difficult wound for [place] to be able to truly get to heal. I may want to consider some type of skin substitute to see if this would be of benefit for her. I'll discuss this with her more the next visit most likely. This was something I thought about more at the end of the visit when I was Artie out of the room and the patient had been discharged. 01/17/19 on evaluation today patient appears to be doing very well in regard to her wounds in general. She's been making excellent progress at this time. Fortunately there's no sign of infection at this time either. No fevers, chills, nausea, or vomiting noted at this time. The biggest issue is still her left lateral malleolus where it appears to be doing well and is getting smaller but still shows a  small corner where this is deeper and goes down into what appears to be  the joint space. Nonetheless this is taking much longer to heal although it still looks better in smaller than previous evaluations. 01/24/19 on evaluation today patient's wounds actually appear to be doing rather well in general overall. She did require some sharp debridement in regard to the right great toe but everything else appears to be doing excellent no debridement was even necessary. No fevers, chills, nausea, or vomiting noted at this time. 01/31/19 on evaluation today patient actually appears to be doing much better in regard to her left foot wound on the heel as well as the ankle. The right great toe appears to be a little bit worse today this had callous over and trapped a lot of fluid underneath. Fortunately there's no signs of infection at any site which is great news. 02/07/19 on evaluation today patient actually appears to be doing decently well in regard to all of her ulcers at this point. No sharp debridement was required she is a little bit of hyper granulation in regard to the left lateral ankle as well as the left heel but the hill itself is almost completely healed which is excellent news. Overall been very pleased in this regard. 02/14/19 on evaluation today patient actually appears to be doing very well in regard to her ulcers on the right first toe, left lateral malleolus, and left heel. In fact the heel is almost completely healed at this point. The patient does not show any signs of infection which is good news. Overall very pleased with how things have progressed. 04/18/19 Telehealth Evaluation During the COVID-19 National Emergency: Verbal Consent: Obtained from patient Allergies: reviewed and the active list is current. Medication changes: patient has no current medication changes. COVID-19 Screening: 1. Have you traveled internationally or on a cruise ship in the last 14 dayso No 2. Have you had  contact with someone with or under investigation for COVID-19o No 3. Have you had a fever, cough, sore throat, or experiencing shortness of breatho No on evaluation today actually did have a visit with this patient through a telehealth encounter with her home health nurse. Subsequently it was noted that the patient actually appears to be doing okay in regard to her wounds both the right great toe as well as the left lateral malleolus have shown signs of improvement although this in your theme around the left lateral malleolus there eschar coverings for both locations. The question is whether or not they are actually close and whether or not home health needs to discharge the patient or not. Nonetheless my concern is this point obviously is that without actually seeing her and being able to evaluate this directly I cannot ensure that she is completely healed which is the question that I'm being asked. 04/22/19 on evaluation today patient presents for her first evaluation since last time I saw her which was actually February 14, 2019. I did do a telehealth visit last week in which point it was questionable whether or not she may be healed and had to bring her in today for confirmation. With that being said she does seem to be doing quite well at this point which is good news. There does not appear to be any drainage in the deed I believe her wounds may be healed. Readmission: 09/04/2019 on evaluation today patient appears to be doing unfortunately somewhat more poorly in regard to her left foot ulcer secondary to a wound that began on 08/21/2019 at least when she first noticed this. Fortunately she has not had  any evidence of active infection at this time. Systemically. I also do not necessarily see any evidence of infection at the blister/wound site on the first metatarsal head plantar aspect. This almost appears to be something that may have just rubbed inappropriately causing this to breakdown. They did  not want a wait too long to come in to be seen as again she had significant issues in the past with wounds that took quite a while to heal in fact it was close to 2 years. Nonetheless this does not appear to be quite that bad but again we do need to remove some of the necrotic tissue from the surface of the wound to tell exactly the extent. She does not appear to have any significant arterial disease at this point and again her last ABIs and TBI's are recorded above in the alert section her left ABI was 1.27 with a TBI of 0.72 to the right ABI 1.08 with a TBI of 0.39. Other than this the patient has been doing quite [name], [name] ([id number]) well since I last saw her and that was in May 2020. 09/11/2019 on evaluation today patient appeared to be doing very well with regard to her plantar foot ulcer on the left. In fact this appears to be almost completely healed which is awesome. That is after just 1 week of intervention. With that being said there is no signs of active infection at this time. 09/18/2019 on evaluation today patient actually appears to be doing excellent in fact she is completely healed based on what I am seeing at this point. Fortunately there is no signs of active infection at this time and overall patient is very pleased to hear that this area has healed so quickly. Readmission: 05/13/2021 upon evaluation today this patient presents for reevaluation here in the clinic. This is a wound that actually we previously took care of. She had 1 on the right ankle and the left the left turned out to be be harder due to to heal but nonetheless is doing great at this point as the right that has reopened and it was noted first just several weeks ago with a scab over it and came off in just the past few days. Fortunately there does not appear to be any obvious evidence of significant active infection at this time which is great news. No fevers, chills, nausea, vomiting, or diarrhea. The  patient does have a history of pacemaker along with being on Eliquis currently as well. There does not appear to be any signs of this interfering in any way with her wound. She does have swelling we previously had compression socks for her ordered but again it does not look like she wears these on a regular basis by any means. 05/26/2021 upon evaluation today patient appears to be doing well with regard to her wound which is actually showing signs of excellent improvement. There does not appear to be any signs of active infection which is great news and overall very pleased with where things stand today. No fevers, chills, nausea, vomiting, or diarrhea. 06/02/2021 upon evaluation today patient's wound actually showing signs of excellent improvement. Fortunately there does not appear to be any signs of active infection which is great news. I think the patient is making good progress with regard to her wounds in general. 06/09/2021 upon evaluation today patient appears to be doing excellent in regard to her wounds currently. Fortunately there does not appear to be any signs of active infection which  is great news. No fevers, chills, nausea, vomiting, or diarrhea. Overall extremely pleased with where things stand today. I think the patient is making excellent progress. 06/16/2021 upon evaluation today patient appears to be doing well in regard to her wound. This is going require little bit of debridement today and that was discussed with the patient. Otherwise she seems to be doing quite well and I am actually very pleased with where things stand at this point. No fevers, chills, nausea, vomiting, or diarrhea. 06/23/2021 upon evaluation today patient appears to be doing well with regard to her wounds. She has been tolerating the dressing changes without complication. Fortunately there does not appear to be any evidence of infection and she has not had air in her home which she actually lives at an assisted  living that got fixed this morning. With that being said because of that her wrap has been extremely hot and bothersome for her over the past week. 06/30/2021 upon evaluation today patient is actually making excellent progress in regard to her ankle ulcer. She has been tolerating the dressing changes without complication and overall extremely pleased with where things stand there does not appear to be any evidence of active infection which is great news. No fevers, chills, nausea, vomiting, or diarrhea. 07/07/21 upon evaluation today patients and culture on the right actually appears to be doing quite well. There does not appear to be any signs of infection and overall very pleased with where things stand today. No fevers, chills, nausea, or vomiting noted at this time. 07/14/2021 unfortunately the patient today has some evidence of deep tissue injury and pressure getting to the ankle region. Again I am not exactly sure what is going on here but this is very similar to issues that we have had in the past. I explained to the patient that she needs to be very mindful of exactly what is happening I think sleeping in bed is probably the main issue here although there could be other culprits I am not sure what else would potentially lead to this kind of a problem for her. 07/21/2021 upon evaluation today patient's wound actually showing signs of improvement compared to last week. Fortunately there does not appear to be any signs of active infection which is great news and overall very pleased with where things stand in that regard. With that being said I do believe that she is continuing to show signs of overall of getting better although I think this is still basically about what we were 2 weeks ago due to the worsening and now improvement. 07/28/2021 upon evaluation today patient appears to be doing well with regard to her wound. She does have some slough buildup on the surface of the wound which I would have  to manage today. Fortunately there is no sign of active infection at this time. No fevers, chills, nausea, vomiting, or diarrhea. 08/04/2021 upon evaluation today patient appears to be doing about the same in regard to her wound. To be perfectly honest I am beginning to be a little bit concerned about the overall appearance of the wound bed. I do think possibly taking a sample right around the margin of the wound could be beneficial for her as far as identifying anything such as an inflammatory process or to be honest even a skin tag cancer type process that may be of concern here. Fortunately there does not appear to be any evidence of active infection at this time which is great news she is  not having any pain also great news. 08/11/2021 upon evaluation today patient appears to be doing well with regard to her wound. The good news is I did review her biopsy results and it showed some inflammatory mixed findings but nothing that appeared to be malignant which is great news. Overall this is more of a chronic venous stasis type issue which again is more what we have been treating. Nonetheless I just wanted to make sure before going forward that there was not anything more untoward going on at this point. 08/18/2021 upon evaluation today patient appears to be doing well with regard to her ankle ulcer. Fortunately there does not appear to be any signs of active infection at this time which is great overall wound is dramatically improved compared to last week. Since last week I have actually placed her on doxycycline and subsequently this is a good option as far as the findings are concerned at this point. 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 the doxycycline is doing that. There does not appear to be any evidence of active infection systemically which is great news. 08/25/2021  upon evaluation today patient appears to be doing well with regard to her wound. I feel like we are finally get back on track as far as healing is concerned I am much happier with the overall appearance today. I do think that she is tolerating the dressing changes without complication which is great news. We have been using Hydrofera Blue which I think is a good option. The good news is she is also doing great in Dana, BONNIEJEAN PIANO. ([id number]) regard to her compression sock on the left which is a zipper compression that seems to be doing a great job keeping her edema under good control. 09/01/2021 upon evaluation today patient appears to be doing well with regard to her wound. Infection seems to be under much better control which is great news and very pleased in that regard. Fortunately there does not appear to be any signs of infection currently. 09/08/2021 upon evaluation today patient actually appears to be making good progress in regard to her wound. She has been tolerating the dressing changes without complication. Fortunately there does not appear to be any evidence of active infection at this time which is great news as well. No fevers, chills, nausea, vomiting, or diarrhea. 09/22/2021 upon evaluation today patient appears to be doing well with regard to her wound although is very slow to heal. We have not looked into Apligraf yet I think that is something that we should see about doing. 09/29/2021 upon evaluation today patient's wound is actually showing signs of doing about the same. I am not seeing any evidence of worsening but also no significant evidence of improvement. We did gain approval for the organogenesis products all except for Apligraf as covered by her insurance. With that being said I do think that we can go ahead and proceed with the NuShield if the patient and her family in agreement of the plan I discussed that with him today she does have a 20% coinsurance which we also  discussed. 10/06/2021 upon evaluation today patient appears to unfortunately be doing a little bit worse she appears to be infected based on what I am seeing. Fortunately there does not appear to be any signs of active infection at this time which is great news.  Unfortunately it does appear to be some evidence of around the wound edge indicated by way of erythema and warmth as well as redness 10/13/2021 upon evaluation today patient actually appears to be doing excellent in regard to her ankle ulcer compared to what it was. 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 I think were fine to go ahead and apply the NuShield today. 10/20/2021 upon evaluation today patient appears to be doing excellent in fact the NuShield seems to have done all some for her thus far. I am actually very pleased with where we stand and overall I think that she is making great progress. There is no evidence of active infection at this time. 11/23; patient presents for follow-up. She has no issues or complaints today. She denies signs of infection. She reports stability in her wound healing. 11/03/2021 upon evaluation today patient appears to be doing well with regard to her wounds. She has been tolerating the dressing changes without complication. Fortunately there is no signs of active infection at this time. 11/10/2021 upon evaluation today patient appears to be doing well with regard to her right ankle which is actually showing signs of improvement with the NuShield I am very pleased. Subsequently in regards to the left ankle this appears to be doing okay with no evidence of issue here either. With that being said she has a significant contusion on the left leg from having fallen 2 days ago. She tells me that she was using her walker  going to the closet and then when she got to the closet turned around to get something out at which point she fell according to the story. Nonetheless she does have a hematoma just below her knee on the anterior portion of her shin. My hope is that this will not open until wound although we do need I think Some compression over it also think that we need to have her use an ice as well to help with the swelling and prevent this from getting worse. The last thing she needs is a wound opened up here. 11/17/2021 upon evaluation patient's right ankle actually showing signs of improvement based on what I see currently there is a lot of new skin growth coming in which is great news. Nonetheless I do feel like that the patient is showing improvement as well in regard to her left leg with the use of the Tubigrip it swollen but not as bruised as what I would have expected after what I was seeing last week. Nonetheless I do think doubling up on the Tubigrip would probably be beneficial to try to keep some of the edema under better control here. 11/24/2021 upon evaluation today patient appears to be doing excellent in regard to her wound. This is actually measuring significantly smaller which is great news. Fortunately I do not see any signs of active infection locally nor systemically at this point. Overall I am very pleased with how the NuShield is doing. 12/30; wound bed looks healthy however not much change in overall wound volume. We applied Nushield again today in the standard fashion 12/08/2021 upon evaluation today patient's wound actually showing signs of good improvement and I am actually very pleased with where we stand today as well. I do not see any evidence of active infection locally nor  systemically which is great news. Unfortunately she has been having some issues with stomach upset and diarrhea today. 12/15/2021 upon evaluation today patient appears to be doing excellent in regard to her wound.  There is a little bit of dry skin raised up around the edges of the wound but fortunately nothing too significant at this point. Fortunately I do not see any evidence of active infection either which is great news. No fevers, chills, nausea, vomiting, or diarrhea. 12/29/2021 upon evaluation today patient appears to be doing well with regard to her wound. In general I feel like she is getting very close to complete resolution. I think that she is making good progress here as well. Overall I do not see any signs of infection and very little of this appears to actually be open. 01/12/2022 upon evaluation today patient appears to be doing a little bit worse in regard to her wound. It appears that the collagen actually trapped fluid underneath the collagen which got hard and subsequently caused the fluid to back up. This patient has made the wound appear to be larger than what it was previous. With that being said I do not see any signs of obvious infection is not warm to touch and not significantly erythematous either which is good news. Nonetheless I do think that we will need to continue to keep an eye on this. Probably not go put on antibiotic this week but I will be too far from doing so she is actually on a Z-Pak right now I do not want to really double up on antibiotics. MATASHA, SMIGELSKI ([id number]) Objective Constitutional Well-nourished and well-hydrated in no acute distress. Vitals Time Taken: 10:23 AM, Height: 62 in, Weight: 150 lbs, BMI: 27.4, Temperature: 98.5 F, Pulse: 83 bpm, Respiratory Rate: 18 breaths/min, Blood Pressure: 137/83 mmHg. Respiratory normal breathing without difficulty. Psychiatric this patient is able to make decisions and demonstrates good insight into disease process. Alert and Oriented x 3. pleasant and cooperative. General Notes: Upon inspection patient's wound bed actually showed signs of good granulation and epithelization at this point in some areas I  was able to remove some of the callus which was thickened around the edges of the wound postdebridement this appears to be doing much better which is great news. I do not see any signs of active infection locally or systemically at this point. Integumentary (Hair, Skin) Wound #7 status is Open. Original cause of wound was Gradually Appeared. The date acquired was: 05/12/2021. The wound has been in treatment 34 weeks. The wound is located on the Right,Lateral Malleolus. The wound measures 1cm length x 0.9cm width x 0.2cm depth; 0.707cm^2 area and 0.141cm^3 volume. There is Fat Layer (Subcutaneous Tissue) exposed. There is no tunneling noted, however, there is undermining starting at 12:00 and ending at 6:00 with a maximum distance of 0.2cm. There is a medium amount of serosanguineous drainage noted. There is medium (34-66%) red granulation within the wound bed. There is a medium (34-66%) amount of necrotic tissue within the wound bed including Adherent Slough. Assessment Active Problems ICD-10 Pressure ulcer of right ankle, stage 3 Lymphedema, not elsewhere classified Venous insufficiency (chronic) (peripheral) Presence of cardiac pacemaker Long term (current) use of anticoagulants Procedures Wound #7 Pre-procedure diagnosis of Wound #7 is a Pressure Ulcer located on the Right,Lateral Malleolus . There was a Excisional Skin/Subcutaneous Tissue Debridement with a total area of 0.9 sq cm performed by [name] Sams., PA-C. With the following instrument(s): Curette to remove Viable and Non-Viable  tissue/material. Material removed includes Subcutaneous Tissue, Slough, Skin: Dermis, and Skin: Epidermis after achieving pain control using Lidocaine. No specimens were taken. A time out was conducted at 10:45, prior to the start of the procedure. A Moderate amount of bleeding was controlled with Pressure. The procedure was tolerated well with a pain level of 0 throughout and a pain level of 0 following  the procedure. Post Debridement Measurements: 1cm length x 0.9cm width x 0.2cm depth; 0.141cm^3 volume. Post debridement Stage noted as Category/Stage III. Character of Wound/Ulcer Post Debridement is improved. Post procedure Diagnosis Wound #7: Same as Pre-Procedure Plan Follow-up Appointments: Return Appointment in 1 week. Bathing/ Shower/ Hygiene: May shower with wound dressing protected with water repellent cover or cast protector. Edema Control - Lymphedema / Segmental Compressive Device / Other: Optional: One layer of unna paste to top of compression wrap (to act as an anchor). - and at toes Sanvika, Cuttino [name] ([id number]) Patient to wear own compression stockings. Remove compression stockings every night before going to bed and put on every morning when getting up. - left leg Elevate, Exercise Daily and Avoid Standing for Long Periods of Time. Elevate legs to the level of the heart and pump ankles as often as possible Elevate leg(s) parallel to the floor when sitting. WOUND #7: - Malleolus Wound Laterality: Right, Lateral Cleanser: Soap and Water 1 x Per Week/30 Days Discharge Instructions: Gently cleanse wound with antibacterial soap, rinse and pat dry prior to dressing wounds Primary Dressing: Algicell Calcium Alginate Dressing 2x2 (in/in) 1 x Per Week/30 Days Primary Dressing: Medihoney - Gel, 1.5 (oz), tube 1 x Per Week/30 Days Secondary Dressing: Gauze 1 x Per Week/30 Days Discharge Instructions: As directed: dry, moistened with saline or moistened with Dakins Solution Compression Wrap: Profore Lite LF 3 Multilayer Compression Bandaging System 1 x Per Week/30 Days Discharge Instructions: Apply 3 multi-layer wrap as prescribed. 1. Would recommend at this point that we going to continue with the wound care measures as before and the patient is in agreement with plan. This includes the use of the compression wrap which I do think is doing a good job. 2. Muscle can recommend  that we use Medihoney with an alginate following to help with the wound itself and see how this does. 3. I would also suggest patient continue to elevate her legs if she notices any increased pain she should let me know soon as possible and will subsequently make any adjustments in care as we need to. We will see patient back for reevaluation in 1 week here in the clinic. If anything worsens or changes patient will contact our office for additional recommendations. Electronic Signature(s) Signed: 01/12/2022 11:29:45 AM By: [name] Keeler PA-C Entered By: [name] on 01/12/2022 11:29:44 [place], [name] ([id number]) -------------------------------------------------------------------------------- SuperBill Details Patient Name: [name] Date of Service: 01/12/2022 Medical Record Number: [id number] Patient Account Number: [id number] Date of Birth/Sex: [birth year] (88 y.o. F) Treating RN: [name] Primary Care Provider: Adrian [name] Other Clinician: Referring Provider: Adrian [name] Treating Provider/Extender: [name] in Treatment: 34 Diagnosis Coding ICD-10 Codes Code Description [phone] Pressure ulcer of right ankle, stage 3 I89.0 Lymphedema, not elsewhere classified I87.2 Venous insufficiency (chronic) (peripheral) Z95.0 Presence of cardiac pacemaker Z79.01 Long term (current) use of anticoagulants Facility Procedures CPT4 Code: [id number] Description: 11042 - DEB SUBQ TISSUE 20 SQ CM/< Modifier: Quantity: 1 CPT4 Code: Description: ICD-10 Diagnosis Description 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, stage 3 Modifier: Quantity: Physician Procedures CPT4 Code:  [id number] Description: 11042 - WC PHYS SUBQ TISS 20 SQ CM Modifier: Quantity: 1 CPT4 Code: Description: ICD-10 Diagnosis Description 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, stage 3 Modifier: Quantity: Electronic Signature(s) Signed: 01/12/2022 11:30:01 AM By: [name] Keeler PA-C Entered  By: [name] on 01/12/2022 11:30:01

## 2022-01-19 ENCOUNTER — Other Ambulatory Visit: Payer: Self-pay

## 2022-01-19 DIAGNOSIS — L89513 Pressure ulcer of right ankle, stage 3: Secondary | ICD-10-CM | POA: Diagnosis not present

## 2022-01-19 NOTE — Progress Notes (Signed)
ARYANAH, ENSLOW ([id number]) Visit Report for 01/19/2022 Physician Orders Details Patient Name: [name], [name]. Date of Service: 01/19/2022 10:15 AM Medical Record Number: [id number] Patient Account Number: [id number] Date of Birth/Sex: 06-08-1923 (88 y.o. F) Treating RN: Carlene Coria Primary Care Provider: Adrian Prows Other Clinician: Referring Provider: Adrian Prows Treating Provider/Extender: Skipper Cliche in Treatment: 10 Verbal / Phone Orders: No Diagnosis Coding Follow-up Appointments o Return Appointment in 1 week. Bathing/ Shower/ Hygiene o May shower with wound dressing protected with water repellent cover or cast protector. Edema Control - Lymphedema / Segmental Compressive Device / Other o Optional: One layer of unna paste to top of compression wrap (to act as an anchor). - and at toes o Patient to wear own compression stockings. Remove compression stockings every night before going to bed and put on every morning when getting up. - left leg o Elevate, Exercise Daily and Avoid Standing for Long Periods of Time. o Elevate legs to the level of the heart and pump ankles as often as possible o Elevate leg(s) parallel to the floor when sitting. Wound Treatment Wound #7 - Malleolus Wound Laterality: Right, Lateral Cleanser: Soap and Water 1 x Per Week/30 Days Discharge Instructions: Gently cleanse wound with antibacterial soap, rinse and pat dry prior to dressing wounds Primary Dressing: Algicell Calcium Alginate Dressing 2x2 (in/in) 1 x Per Week/30 Days Primary Dressing: Medihoney - Gel, 1.5 (oz), tube 1 x Per Week/30 Days Secondary Dressing: Gauze 1 x Per Week/30 Days Discharge Instructions: As directed: dry, moistened with saline or moistened with Dakins Solution Compression Wrap: Profore Lite LF 3 Multilayer Compression Bandaging System 1 x Per Week/30 Days Discharge Instructions: Apply 3 multi-layer wrap as prescribed. Electronic  Signature(s) Signed: 01/19/2022 10:49:17 AM By: Carlene Coria RN Signed: 01/19/2022 2:30:26 PM By: Worthy Keeler PA-C Entered By: Carlene Coria on 01/19/2022 10:49:16 [place], [name] ([id number]) -------------------------------------------------------------------------------- SuperBill Details Patient Name: [name] Date of Service: 01/19/2022 Medical Record Number: [id number] Patient Account Number: [id number] Date of Birth/Sex: 1923/04/12 (88 y.o. F) Treating RN: Carlene Coria Primary Care Provider: Adrian Prows Other Clinician: Referring Provider: Adrian Prows Treating Provider/Extender: Skipper Cliche in Treatment: 35 Diagnosis Coding ICD-10 Codes Code Description [phone] Pressure ulcer of right ankle, stage 3 I89.0 Lymphedema, not elsewhere classified I87.2 Venous insufficiency (chronic) (peripheral) Z95.0 Presence of cardiac pacemaker Z79.01 Long term (current) use of anticoagulants Facility Procedures CPT4 Code: [id number] Description: (Facility Use Only) [phone] - [place] Modifier: Quantity: 1 Electronic Signature(s) Signed: 01/19/2022 10:50:34 AM By: Carlene Coria RN Signed: 01/19/2022 2:30:26 PM By: Worthy Keeler PA-C Entered By: Carlene Coria on 01/19/2022 10:50:34

## 2022-01-19 NOTE — Progress Notes (Signed)
[name] ([id number]) Visit Report for 01/19/2022 Arrival Information Details Patient Name: [name] Date of Service: 01/19/2022 10:15 AM Medical Record Number: [id number] Patient Account Number: [id number] Date of Birth/Sex: [birth year] (88 y.o. F) Treating RN: Carlene Coria Primary Care Maxcine Strong: Adrian Prows Other Clinician: Referring Lurae Hornbrook: Adrian Prows Treating Bobie Kistler/Extender: Skipper Cliche in Treatment: 55 Visit Information History Since Last Visit All ordered tests and consults were completed: No Patient Arrived: [name] Added or deleted any medications: No Arrival Time: 10:15 Any new allergies or adverse reactions: No Accompanied By: daughter Had a fall or experienced change in No Transfer Assistance: None activities of daily living that may affect Patient Identification Verified: Yes risk of falls: Secondary Verification Process Completed: Yes Signs or symptoms of abuse/neglect since last visito No Patient Requires Transmission-Based No Hospitalized since last visit: No Precautions: Implantable device outside of the clinic excluding No Patient Has Alerts: Yes cellular tissue based products placed in the center Patient Alerts: Patient on Blood since last visit: Thinner Has Dressing in Place as Prescribed: Yes NOT DIABETIC Has Compression in Place as Prescribed: Yes ***ELIQUIS*** Pain Present Now: No Electronic Signature(s) Signed: 01/19/2022 10:46:34 AM By: Carlene Coria RN Entered By: Carlene Coria on 01/19/2022 10:46:33 [place], [name] ([id number]) -------------------------------------------------------------------------------- Clinic Level of Care Assessment Details Patient Name: [name] Date of Service: 01/19/2022 10:15 AM Medical Record Number: [id number] Patient Account Number: [id number] Date of Birth/Sex: [birth year] (88 y.o. F) Treating RN: Carlene Coria Primary Care Jaleisa Brose: Adrian Prows Other  Clinician: Referring Janah Mcculloh: Adrian Prows Treating Marelly Wehrman/Extender: Skipper Cliche in Treatment: 35 Clinic Level of Care Assessment Items TOOL 1 Quantity Score []  - Use when EandM and Procedure is performed on INITIAL visit 0 ASSESSMENTS - Nursing Assessment / Reassessment []  - General Physical Exam (combine w/ comprehensive assessment (listed just below) when performed on new 0 pt. evals) []  - 0 Comprehensive Assessment (HX, ROS, Risk Assessments, Wounds Hx, etc.) ASSESSMENTS - Wound and Skin Assessment / Reassessment []  - Dermatologic / Skin Assessment (not related to wound area) 0 ASSESSMENTS - Ostomy and/or Continence Assessment and Care []  - Incontinence Assessment and Management 0 []  - 0 Ostomy Care Assessment and Management (repouching, etc.) PROCESS - Coordination of Care []  - Simple Patient / Family Education for ongoing care 0 []  - 0 Complex (extensive) Patient / Family Education for ongoing care []  - 0 Staff obtains Programmer, systems, Records, Test Results / Process Orders []  - 0 Staff telephones HHA, Nursing Homes / Clarify orders / etc []  - 0 Routine Transfer to another Facility (non-emergent condition) []  - 0 Routine Hospital Admission (non-emergent condition) []  - 0 New Admissions / Biomedical engineer / Ordering NPWT, Apligraf, etc. []  - 0 Emergency Hospital Admission (emergent condition) PROCESS - Special Needs []  - Pediatric / Minor Patient Management 0 []  - 0 Isolation Patient Management []  - 0 Hearing / Language / Visual special needs []  - 0 Assessment of Community assistance (transportation, D/C planning, etc.) []  - 0 Additional assistance / Altered mentation []  - 0 Support Surface(s) Assessment (bed, cushion, seat, etc.) INTERVENTIONS - Miscellaneous []  - External ear exam 0 []  - 0 Patient Transfer (multiple staff / Civil Service fast streamer / Similar devices) []  - 0 Simple Staple / Suture removal (25 or less) []  - 0 Complex Staple / Suture removal  (26 or more) []  - 0 Hypo/Hyperglycemic Management (do not check if billed separately) []  - 0 Ankle / Brachial Index (ABI) - do not check if billed separately Has the  patient been seen at the hospital within the last three years: Yes Total Score: 0 Level Of Care: ____ [name] ([id number]) Electronic Signature(s) Unsigned Entered By: Carlene Coria on 01/19/2022 10:50:16 Signature(s): Date(s): Joni Fears, [name] ([id number]) -------------------------------------------------------------------------------- Compression Therapy Details Patient Name: [name] Date of Service: 01/19/2022 10:15 AM Medical Record Number: [id number] Patient Account Number: [id number] Date of Birth/Sex: 08-Oct-1923 (88 y.o. F) Treating RN: Carlene Coria Primary Care Davion Flannery: Adrian Prows Other Clinician: Referring Javonda Suh: Adrian Prows Treating Meris Reede/Extender: Skipper Cliche in Treatment: 35 Compression Therapy Performed for Wound Assessment: Wound #7 Right,Lateral Malleolus Performed By: Jake Church, RN Compression Type: Three Layer Electronic Signature(s) Signed: 01/19/2022 10:48:10 AM By: Carlene Coria RN Entered By: Carlene Coria on 01/19/2022 10:48:10 [name], [name] ([id number]) -------------------------------------------------------------------------------- Encounter Discharge Information Details Patient Name: [name] Date of Service: 01/19/2022 10:15 AM Medical Record Number: [id number] Patient Account Number: [id number] Date of Birth/Sex: 1923/03/15 (88 y.o. F) Treating RN: Carlene Coria Primary Care Carrie Schoonmaker: Adrian Prows Other Clinician: Referring Louan Base: Adrian Prows Treating Altheria Shadoan/Extender: Skipper Cliche in Treatment: 51 Encounter Discharge Information Items Discharge Condition: Stable Ambulatory Status: Ambulatory Discharge Destination: Home Transportation: Private Auto Accompanied By: daughter Schedule  Follow-up Appointment: Yes Clinical Summary of Care: Patient Declined Electronic Signature(s) Signed: 01/19/2022 10:50:02 AM By: Carlene Coria RN Entered By: Carlene Coria on 01/19/2022 10:50:02 [name], [name] ([id number]) -------------------------------------------------------------------------------- Wound Assessment Details Patient Name: [name] Date of Service: 01/19/2022 10:15 AM Medical Record Number: [id number] Patient Account Number: [id number] Date of Birth/Sex: [birth year] (88 y.o. F) Treating RN: Carlene Coria Primary Care Javia Dillow: Adrian Prows Other Clinician: Referring Vada Swift: Adrian Prows Treating Destyne Goodreau/Extender: Skipper Cliche in Treatment: 35 Wound Status Wound Number: 7 Primary Pressure Ulcer Etiology: Wound Location: Right, Lateral Malleolus Wound Open Wounding Event: Gradually Appeared Status: Date Acquired: 05/12/2021 Comorbid Cataracts, Congestive Heart Failure, Hypertension, Weeks Of Treatment: 35 History: Peripheral Arterial Disease, Osteoarthritis, Neuropathy Clustered Wound: No Wound Measurements Length: (cm) 1 Width: (cm) 0.9 Depth: (cm) 0.2 Area: (cm) 0.707 Volume: (cm) 0.141 % Reduction in Area: 37% % Reduction in Volume: 37.3% Epithelialization: None Tunneling: No Undermining: No Wound Description Classification: Category/Stage III Exudate Amount: Medium Exudate Type: Serosanguineous Exudate Color: red, brown Foul Odor After Cleansing: No Slough/Fibrino Yes Wound Bed Granulation Amount: Medium (34-66%) Exposed Structure Granulation Quality: Red Fascia Exposed: No Necrotic Amount: Medium (34-66%) Fat Layer (Subcutaneous Tissue) Exposed: Yes Necrotic Quality: Adherent Slough Tendon Exposed: No Muscle Exposed: No Joint Exposed: No Bone Exposed: No Treatment Notes Wound #7 (Malleolus) Wound Laterality: Right, Lateral Cleanser Soap and Water Discharge Instruction: Gently cleanse wound with antibacterial  soap, rinse and pat dry prior to dressing wounds Peri-Wound Care Topical Primary Dressing Algicell Calcium Alginate Dressing 2x2 (in/in) Medihoney - Gel, 1.5 (oz), tube Secondary Dressing Gauze Discharge Instruction: As directed: dry, moistened with saline or moistened with Dakins Solution Secured With Compression Wrap Profore Lite LF 3 Multilayer Compression Bandaging System Discharge Instruction: Apply 3 multi-layer wrap as prescribed. DIAMONE, WHISTLER ([id number]) Compression Stockings Add-Ons Electronic Signature(s) Signed: 01/19/2022 10:47:18 AM By: Carlene Coria RN Entered By: Carlene Coria on 01/19/2022 10:47:18

## 2022-01-26 ENCOUNTER — Encounter: Payer: Medicare Other | Admitting: Physician Assistant

## 2022-01-26 ENCOUNTER — Other Ambulatory Visit: Payer: Self-pay

## 2022-01-26 DIAGNOSIS — L89513 Pressure ulcer of right ankle, stage 3: Secondary | ICD-10-CM | POA: Diagnosis not present

## 2022-01-26 NOTE — Progress Notes (Addendum)
[name] ([id number]) Visit Report for 01/26/2022 Arrival Information Details Patient Name: [name], [name]. Date of Service: 01/26/2022 10:15 AM Medical Record Number: [id number] Patient Account Number: [id number] Date of Birth/Sex: 22-Dec-1922 (88 y.o. F) Treating RN: Carlene Coria Primary Care Albirda Shiel: Adrian Prows Other Clinician: Referring Nakeia Calvi: Adrian Prows Treating Bryanda Mikel/Extender: Skipper Cliche in Treatment: 72 Visit Information History Since Last Visit All ordered tests and consults were completed: No Patient Arrived: Ambulatory Added or deleted any medications: No Arrival Time: 10:05 Any new allergies or adverse reactions: No Accompanied By: self Had a fall or experienced change in No Transfer Assistance: None activities of daily living that may affect Patient Identification Verified: Yes risk of falls: Secondary Verification Process Completed: Yes Signs or symptoms of abuse/neglect since last visito No Patient Requires Transmission-Based No Hospitalized since last visit: No Precautions: Implantable device outside of the clinic excluding No Patient Has Alerts: Yes cellular tissue based products placed in the center Patient Alerts: Patient on Blood since last visit: Thinner Has Dressing in Place as Prescribed: Yes NOT DIABETIC Pain Present Now: No ***ELIQUIS*** Electronic Signature(s) Signed: 01/30/2022 8:01:43 AM By: Carlene Coria RN Entered By: Carlene Coria on 01/26/2022 10:18:28 [name], [name] ([id number]) -------------------------------------------------------------------------------- Clinic Level of Care Assessment Details Patient Name: [name] Date of Service: 01/26/2022 10:15 AM Medical Record Number: [id number] Patient Account Number: [id number] Date of Birth/Sex: 1923/10/17 (88 y.o. F) Treating RN: Carlene Coria Primary Care Oluwanifemi Susman: Adrian Prows Other Clinician: Referring Crew Goren: Adrian Prows Treating Jaykub Mackins/Extender: Skipper Cliche in Treatment: 36 Clinic Level of Care Assessment Items TOOL 1 Quantity Score []  - Use when EandM and Procedure is performed on INITIAL visit 0 ASSESSMENTS - Nursing Assessment / Reassessment []  - General Physical Exam (combine w/ comprehensive assessment (listed just below) when performed on new 0 pt. evals) []  - 0 Comprehensive Assessment (HX, ROS, Risk Assessments, Wounds Hx, etc.) ASSESSMENTS - Wound and Skin Assessment / Reassessment []  - Dermatologic / Skin Assessment (not related to wound area) 0 ASSESSMENTS - Ostomy and/or Continence Assessment and Care []  - Incontinence Assessment and Management 0 []  - 0 Ostomy Care Assessment and Management (repouching, etc.) PROCESS - Coordination of Care []  - Simple Patient / Family Education for ongoing care 0 []  - 0 Complex (extensive) Patient / Family Education for ongoing care []  - 0 Staff obtains Programmer, systems, Records, Test Results / Process Orders []  - 0 Staff telephones HHA, Nursing Homes / Clarify orders / etc []  - 0 Routine Transfer to another Facility (non-emergent condition) []  - 0 Routine Hospital Admission (non-emergent condition) []  - 0 New Admissions / Biomedical engineer / Ordering NPWT, Apligraf, etc. []  - 0 Emergency Hospital Admission (emergent condition) PROCESS - Special Needs []  - Pediatric / Minor Patient Management 0 []  - 0 Isolation Patient Management []  - 0 Hearing / Language / Visual special needs []  - 0 Assessment of Community assistance (transportation, D/C planning, etc.) []  - 0 Additional assistance / Altered mentation []  - 0 Support Surface(s) Assessment (bed, cushion, seat, etc.) INTERVENTIONS - Miscellaneous []  - External ear exam 0 []  - 0 Patient Transfer (multiple staff / Civil Service fast streamer / Similar devices) []  - 0 Simple Staple / Suture removal (25 or less) []  - 0 Complex Staple / Suture removal (26 or more) []  - 0 Hypo/Hyperglycemic  Management (do not check if billed separately) []  - 0 Ankle / Brachial Index (ABI) - do not check if billed separately Has the patient been seen at the hospital within  the last three years: Yes Total Score: 0 Level Of Care: ____ [name] ([id number]) Electronic Signature(s) Signed: 01/30/2022 8:01:43 AM By: Carlene Coria RN Entered By: Carlene Coria on 01/26/2022 10:47:09 [name] ([id number]) -------------------------------------------------------------------------------- Complex / Palliative Patient Assessment Details Patient Name: [name] Date of Service: 01/26/2022 10:15 AM Medical Record Number: [id number] Patient Account Number: [id number] Date of Birth/Sex: 04-06-1923 (88 y.o. F) Treating RN: Donnamarie Poag Primary Care Jesi Jurgens: Adrian Prows Other Clinician: Referring Brailee Riede: Adrian Prows Treating Favio Moder/Extender: Skipper Cliche in Treatment: 36 Complex Wound Management Criteria Patient has remarkable or complex co-morbidities requiring medications or treatments that extend wound healing times. Examples: o Diabetes mellitus with chronic renal failure or end stage renal disease requiring dialysis o Advanced or poorly controlled rheumatoid arthritis o Diabetes mellitus and end stage chronic obstructive pulmonary disease o Active cancer with current chemo- or radiation therapy CHF, PAD, deconditioning Palliative Wound Management Criteria Care Approach Wound Care Plan: Complex Wound Management Electronic Signature(s) Signed: 02/02/2022 10:57:40 AM By: Donnamarie Poag Signed: 02/07/2022 7:37:22 PM By: Worthy Keeler PA-C Entered By: Donnamarie Poag on 02/02/2022 10:57:39 [name] ([id number]) -------------------------------------------------------------------------------- Encounter Discharge Information Details Patient Name: [name] Date of Service: 01/26/2022 10:15 AM Medical Record Number: [id number] Patient  Account Number: [id number] Date of Birth/Sex: 1923/08/12 (88 y.o. F) Treating RN: Carlene Coria Primary Care Nicey Krah: Adrian Prows Other Clinician: Referring Lavonne Cass: Adrian Prows Treating Tenee Wish/Extender: Skipper Cliche in Treatment: 68 Encounter Discharge Information Items Post Procedure Vitals Discharge Condition: Stable Temperature (F): 98.8 Ambulatory Status: Walker Pulse (bpm): 76 Discharge Destination: Home Respiratory Rate (breaths/min): 18 Transportation: Private Auto Blood Pressure (mmHg): 134/70 Accompanied By: self Schedule Follow-up Appointment: Yes Clinical Summary of Care: Patient Declined Electronic Signature(s) Signed: 01/30/2022 8:01:43 AM By: Carlene Coria RN Entered By: Carlene Coria on 01/26/2022 10:49:03 [place], [name] ([id number]) -------------------------------------------------------------------------------- Lower Extremity Assessment Details Patient Name: [name] Date of Service: 01/26/2022 10:15 AM Medical Record Number: [id number] Patient Account Number: [id number] Date of Birth/Sex: [birth year] (88 y.o. F) Treating RN: Carlene Coria Primary Care Etienne Millward: Adrian Prows Other Clinician: Referring Palmira Stickle: Adrian Prows Treating Myliyah Rebuck/Extender: Skipper Cliche in Treatment: 36 Edema Assessment Assessed: [Left: No] [Right: No] Edema: [Left: Ye] [Right: s] Calf Left: Right: Point of Measurement: 33 cm From Medial Instep 27 cm Ankle Left: Right: Point of Measurement: 10 cm From Medial Instep 17 cm Vascular Assessment Pulses: Dorsalis Pedis Palpable: [Right:Yes] Electronic Signature(s) Signed: 01/30/2022 8:01:43 AM By: Carlene Coria RN Entered By: Carlene Coria on 01/26/2022 10:30:29 Grieshop, [name] ([id number]) -------------------------------------------------------------------------------- Multi Wound Chart Details Patient Name: [name] Date of Service: 01/26/2022 10:15 AM Medical Record  Number: [id number] Patient Account Number: [id number] Date of Birth/Sex: 1922-12-13 (88 y.o. F) Treating RN: Carlene Coria Primary Care Cleotis Sparr: Adrian Prows Other Clinician: Referring Eretria Manternach: Adrian Prows Treating Morghan Kester/Extender: Skipper Cliche in Treatment: 36 Vital Signs Height(in): 61 Pulse(bpm): 16 Weight(lbs): 150 Blood Pressure(mmHg): 134/70 Body Mass Index(BMI): 27.4 Temperature(F): 98.8 Respiratory Rate(breaths/min): 18 Photos: [N/A:N/A] Wound Location: Right, Lateral Malleolus N/A N/A Wounding Event: Gradually Appeared N/A N/A Primary Etiology: Pressure Ulcer N/A N/A Comorbid History: Cataracts, Congestive Heart Failure, N/A N/A Hypertension, Peripheral Arterial Disease, Osteoarthritis, Neuropathy Date Acquired: 05/12/2021 N/A N/A Weeks of Treatment: 36 N/A N/A Wound Status: Open N/A N/A Wound Recurrence: No N/A N/A Measurements L x W x D (cm) 0.8x1.1x0.2 N/A N/A Area (cm) : 0.691 N/A N/A Volume (cm) : 0.138 N/A N/A % Reduction in Area: 38.50% N/A N/A %  Reduction in Volume: 38.70% N/A N/A Classification: Category/Stage III N/A N/A Exudate Amount: Medium N/A N/A Exudate Type: Serosanguineous N/A N/A Exudate Color: red, brown N/A N/A Granulation Amount: Medium (34-66%) N/A N/A Granulation Quality: Red N/A N/A Necrotic Amount: Medium (34-66%) N/A N/A Exposed Structures: Fat Layer (Subcutaneous Tissue): N/A N/A Yes Fascia: No Tendon: No Muscle: No Joint: No Bone: No Epithelialization: None N/A N/A Treatment Notes Electronic Signature(s) Signed: 01/26/2022 10:33:03 AM By: Carlene Coria RN Entered By: Carlene Coria on 01/26/2022 10:33:02 [place], [name] ([id number]) -------------------------------------------------------------------------------- Multi-Disciplinary Care Plan Details Patient Name: [name] Date of Service: 01/26/2022 10:15 AM Medical Record Number: [id number] Patient Account Number: [id number] Date of Birth/Sex:  October 16, 1923 (88 y.o. F) Treating RN: Carlene Coria Primary Care Mackay Hanauer: Adrian Prows Other Clinician: Referring Catalyna Reilly: Adrian Prows Treating Chief Walkup/Extender: Skipper Cliche in Treatment: 53 Active Inactive Electronic Signature(s) Signed: 01/30/2022 8:01:43 AM By: Carlene Coria RN Previous Signature: 01/26/2022 10:32:46 AM Version By: Carlene Coria RN Entered By: Carlene Coria on 01/26/2022 10:42:40 Epple, [name] ([id number]) -------------------------------------------------------------------------------- Pain Assessment Details Patient Name: [name] Date of Service: 01/26/2022 10:15 AM Medical Record Number: [id number] Patient Account Number: [id number] Date of Birth/Sex: 1923-08-03 (88 y.o. F) Treating RN: Carlene Coria Primary Care Lillar Bianca: Adrian Prows Other Clinician: Referring Verdia Bolt: Adrian Prows Treating Lagena Strand/Extender: Skipper Cliche in Treatment: 36 Active Problems Location of Pain Severity and Description of Pain Patient Has Paino No Site Locations Pain Management and Medication Current Pain Management: Electronic Signature(s) Signed: 01/30/2022 8:01:43 AM By: Carlene Coria RN Entered By: Carlene Coria on 01/26/2022 10:19:13 Adamski, [name] ([id number]) -------------------------------------------------------------------------------- Patient/Caregiver Education Details Patient Name: [name] Date of Service: 01/26/2022 10:15 AM Medical Record Number: [id number] Patient Account Number: [id number] Date of Birth/Gender: 26-Oct-1923 (88 y.o. F) Treating RN: Carlene Coria Primary Care Physician: Adrian Prows Other Clinician: Referring Physician: Adrian Prows Treating Physician/Extender: Skipper Cliche in Treatment: 44 Education Assessment Education Provided To: Patient Education Topics Provided Wound/Skin Impairment: Methods: Explain/Verbal Responses: State content correctly Electronic  Signature(s) Signed: 01/30/2022 8:01:43 AM By: Carlene Coria RN Entered By: Carlene Coria on 01/26/2022 10:47:41 Cropley, [name] ([id number]) -------------------------------------------------------------------------------- Wound Assessment Details Patient Name: [name] Date of Service: 01/26/2022 10:15 AM Medical Record Number: [id number] Patient Account Number: [id number] Date of Birth/Sex: 1923/06/14 (88 y.o. F) Treating RN: Carlene Coria Primary Care Ida Uppal: Adrian Prows Other Clinician: Referring Ailanie Ruttan: Adrian Prows Treating Casy Brunetto/Extender: Skipper Cliche in Treatment: 36 Wound Status Wound Number: 7 Primary Pressure Ulcer Etiology: Wound Location: Right, Lateral Malleolus Wound Open Wounding Event: Gradually Appeared Status: Date Acquired: 05/12/2021 Comorbid Cataracts, Congestive Heart Failure, Hypertension, Weeks Of Treatment: 36 History: Peripheral Arterial Disease, Osteoarthritis, Neuropathy Clustered Wound: No Photos Wound Measurements Length: (cm) 0.8 Width: (cm) 1.1 Depth: (cm) 0.2 Area: (cm) 0.691 Volume: (cm) 0.138 % Reduction in Area: 38.5% % Reduction in Volume: 38.7% Epithelialization: None Tunneling: No Undermining: No Wound Description Classification: Category/Stage III Exudate Amount: Medium Exudate Type: Serosanguineous Exudate Color: red, brown Foul Odor After Cleansing: No Slough/Fibrino Yes Wound Bed Granulation Amount: Medium (34-66%) Exposed Structure Granulation Quality: Red Fascia Exposed: No Necrotic Amount: Medium (34-66%) Fat Layer (Subcutaneous Tissue) Exposed: Yes Necrotic Quality: Adherent Slough Tendon Exposed: No Muscle Exposed: No Joint Exposed: No Bone Exposed: No Electronic Signature(s) Signed: 01/30/2022 8:01:43 AM By: Carlene Coria RN Entered By: Carlene Coria on 01/26/2022 10:28:59 Mcclenton, [name]  ([id number]) -------------------------------------------------------------------------------- Vitals Details Patient Name: [name] Date of Service: 01/26/2022 10:15 AM Medical Record Number: [id number] Patient  Account Number: [id number] Date of Birth/Sex: 1923-01-30 (88 y.o. F) Treating RN: Carlene Coria Primary Care Tawfiq Favila: Adrian Prows Other Clinician: Referring Estefanny Moler: Adrian Prows Treating Tram Wrenn/Extender: Skipper Cliche in Treatment: 36 Vital Signs Time Taken: 10:18 Temperature (F): 98.8 Height (in): 62 Pulse (bpm): 76 Weight (lbs): 150 Respiratory Rate (breaths/min): 18 Body Mass Index (BMI): 27.4 Blood Pressure (mmHg): 134/70 Reference Range: 80 - 120 mg / dl Electronic Signature(s) Signed: 01/30/2022 8:01:43 AM By: Carlene Coria RN Entered By: Carlene Coria on 01/26/2022 10:18:51

## 2022-01-26 NOTE — Progress Notes (Addendum)
SIGNE, TACKITT ([id number]) Visit Report for 01/26/2022 Chief Complaint Document Details Patient Name: [name], [name]. Date of Service: 01/26/2022 10:15 AM Medical Record Number: [id number] Patient Account Number: [id number] Date of Birth/Sex: 1923-06-20 (88 y.o. F) Treating RN: Carlene Coria Primary Care Provider: Adrian Prows Other Clinician: Referring Provider: Adrian Prows Treating Provider/Extender: Skipper Cliche in Treatment: 36 Information Obtained from: Patient Chief Complaint Right foot ulcer Electronic Signature([name]) Signed: 01/26/2022 10:18:01 AM By: Worthy Keeler PA-C Entered By: Worthy Keeler on 01/26/2022 10:18:00 [name], [name] -------------------------------------------------------------------------------- Debridement Details Patient Name: [name] Date of Service: 01/26/2022 10:15 AM Medical Record Number: [id number] Patient Account Number: [id number] Date of Birth/Sex: [birth year] (88 y.o. F) Treating RN: Carlene Coria Primary Care Provider: Adrian Prows Other Clinician: Referring Provider: Adrian Prows Treating Provider/Extender: Skipper Cliche in Treatment: 36 Debridement Performed for Wound #7 Right,Lateral Malleolus Assessment: Performed By: Physician Tommie Sams., PA-C Debridement Type: Debridement Level of Consciousness (Pre- Awake and Alert procedure): Pre-procedure Verification/Time Out Yes - 10:40 Taken: Start Time: 10:40 Total Area Debrided (L x W): 0.8 (cm) x 1.1 (cm) = 0.88 (cm) Tissue and other material Viable, Non-Viable, Slough, Subcutaneous, Skin: Dermis , Skin: Epidermis, Slough debrided: Level: Skin/Subcutaneous Tissue Debridement Description: Excisional Instrument: Curette Bleeding: Minimum Hemostasis Achieved: Pressure End Time: 10:47 Procedural Pain: 0 Post Procedural Pain: 0 Response to Treatment: Procedure was tolerated well Level of Consciousness (Post- Awake and  Alert procedure): Post Debridement Measurements of Total Wound Length: (cm) 98.8 Stage: Category/Stage III Width: (cm) 76 Depth: (cm) 18 Volume: (cm) 144315.4 Character of Wound/Ulcer Post Debridement: Improved Post Procedure Diagnosis Same as Pre-procedure Electronic Signature([name]) Signed: 01/27/2022 4:05:24 PM By: Worthy Keeler PA-C Signed: 01/30/2022 8:01:43 AM By: Carlene Coria RN Entered By: Carlene Coria on 01/26/2022 10:46:30 [name], [name] ([id number]) -------------------------------------------------------------------------------- HPI Details Patient Name: [name] Date of Service: 01/26/2022 10:15 AM Medical Record Number: [id number] Patient Account Number: [id number] Date of Birth/Sex: Dec 17, 1922 (88 y.o. 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 Dr. Amalia Hailey the podiatrist saw her on 07/20/2017 and sent her to the wound center for possible hyperbaric oxygen therapy. past medical history of peripheral vascular disease, varicose veins, status post appendectomy, basal cell carcinoma excision from the left leg, cholecystectomy, pacemaker placement, right lower extremity angiography done by Dr. dew in March 2017 with placement of a stent. there is also note of a successful ablation of the right small saphenous vein done which was reviewed by ultrasound on 10/24/2016. the patient had a right small saphenous vein ablation done on 10/20/2016. The patient has never been a smoker. She has been seen by Dr. Corene Cornea dew the vascular surgeon who most recently saw her on 06/15/2017 for evaluation of  ongoing problems with right leg swelling. She had a lower extremity arterial duplex examination done(02/13/17) which showed patent distal right superficial femoral artery stent and above-the-knee popliteal stent without evidence of restenosis. The ABI was more than 1.3 on the right and more than 1.3 on the left. This was consistent with noncompressible arteries due to medial calcification. The right great toe pressure and PPG waveforms are within normal limits and the left great toe pressure and PPG waveforms  are decreased. he recommended she continue to wear her compression stockings and continue with elevation. She is scheduled to have a noninvasive arterial study in the near future 08/16/2017 -- had a lower extremity arterial duplex examination done which showed patent distal right superficial femoral artery stent and above-the-knee popliteal stent without evidence of restenosis. The ABI was more than 1.3 on the right and more than 1.3 on the left. This was consistent with noncompressible arteries due to medial calcification. The right great toe pressure and PPG waveforms are within normal limits and the left great toe pressure and PPG waveforms are decreased. the x-ray of the right ankle has not yet been done 08/24/2017 -- had a right ankle x-ray -- IMPRESSION:1. No fracture, bone lesion or evidence of osteomyelitis. 2. Lateral soft tissue swelling with a soft tissue ulcer. she has not yet seen the vascular surgeon for review 08/31/17 on evaluation today patient'[name] wound appears to be showing signs of improvement. She still with her appointment with vascular in order to review her results of her vascular study and then determine if any intervention would be recommended at that time. No fevers, chills, nausea, or vomiting noted at this time. She has been tolerating the dressing changes without complication. 09/28/17 on evaluation today patient'[name] wound appears to show signs of good improvement in  regard to the granulation tissue which is surfacing. There is still a layer of slough covering the wound and the posterior portion is still significantly deeper than the anterior nonetheless there has been some good sign of things moving towards the better. She is going to go back to Dr. dew for reevaluation to ensure her blood flow is still appropriate. That will be before her next evaluation with [place] next week. No fevers, chills, nausea, or vomiting noted at this time. Patient does have some discomfort rated to be a 3-4/10 depending on activity specifically cleansing the wound makes it worse. 10/05/2017 -- the patient was seen by Dr. Lucky Cowboy last week and noninvasive studies showed a normal right ABI with brisk triphasic waveforms consistent with no arterial insufficiency including normal digital pressures. The duplex showed a patent distal right SFA stent and the proximal SFA was also normal. He was pleased with her test and thought she should have enough of perfusion for normal wound healing. He would see her back in 6 months time. 12/21/17 on evaluation today patient appears to be doing fairly well in regard to her right lateral ankle wound. Unfortunately the main issue that she is expansion at this point is that she is having some issues with what appears to be some cellulitis in the right anterior shin. She has also been noting a little bit of uncomfortable feeling especially last night and her ankle area. I'm afraid that she made the developing a little bit of an infection. With that being said I think it is in the early stages. 12/28/17 on evaluation today patient'[name] ankle appears to be doing excellent. She'[name] making good progress at this point the cellulitis seems to have improved after last week'[name] evaluation. Overall she is having no significant discomfort which is excellent news. She does have an appointment with Dr. dew on March 29, 2018 for reevaluation in regard to the stent he placed. She seems  to have excellent blood flow in the right lower extremity. 01/19/12 on evaluation today patient'[name] wound appears to be doing very well. In fact she does not appear to require debridement at this point, there'[name] no evidence of infection, and  overall from the standpoint of the wound she seems to be doing very well. With that being said I believe that it may be time to switch to different dressing away from the [facility] Dressing she tells me she does have a lot going on her friend actually passed away yesterday and she'[name] also having a lot of issues with her husband this obviously is weighing heavy on her as far as your thoughts and concerns today. 01/25/18 on evaluation today patient appears to be doing fairly well in regard to her right lateral malleolus. She has been tolerating the dressing changes without complication. Overall I feel like this is definitely showing signs of improvement as far as how the overall appearance of the wound is there'[name] also evidence of epithelium start to migrate over the granulation tissue. In general I think that she is progressing nicely as far as the wound is concerned. The only concern she really has is whether or not we can switch to every other week visits in order to avoid having as many appointments as her daughters have a difficult time getting her to her appointments as well as the patient'[name] husband to his he is not doing very well at this point. 02/22/18 on evaluation today patient'[name] right lateral malleolus ulcer appears to be doing great. She has been tolerating the dressing changes without complication. Overall you making excellent progress at this time. Patient is having no significant discomfort. LOTOYA, CASELLA ([id number]) 03/15/18 on evaluation today patient appears to be doing much more poorly in regard to her right lateral ankle ulcer at this point. Unfortunately since have last seen her her husband has passed just a few days ago is obviously weighed  heavily on her her daughter also had surgery well she is with her today as usual. There does not appear to be any evidence of infection she does seem to have significant contusion/deep tissue injury to the right lateral malleolus which was not noted previous when I saw her last. It'[name] hard to tell of exactly when this injury occurred although during the time she was spending the night in the hospital this may have been most likely. 03/22/18 on evaluation today patient appears to actually be doing very well in regard to her ulcer. She did unfortunately have a setback which was noted last week however the good news is we seem to be getting back on track and in fact the wound in the core did still have some necrotic tissue which will be addressed at this point today but in general I'm seeing signs that things are on the up and up. She is glad to hear this obviously she'[name] been somewhat concerned that due to the how her wound digressed more recently. 03/29/18 on evaluation today patient appears to be doing fairly well in regard to her right lower extremity lateral malleolus ulcer. She unfortunately does have a new area of pressure injury over the inferior portion where the wound has opened up a little bit larger secondary to the pressure she seems to be getting. She does tell me sometimes when she sleeps at night that it actually hurts and does seem to be pushing on the area little bit more unfortunately. There does not appear to be any evidence of infection which is good news. She has been tolerating the dressing changes without complication. She also did have some bruising in the left second and third toes due to the fact that she may have bump this or injured it although  she has neuropathy so she does not feel she did move recently that may have been where this came from. Nonetheless there does not appear to be any evidence of infection at this time. 04/12/18 on evaluation today patient'[name] wound on the right  lateral ankle actually appears to be doing a little bit better with a lot of necrotic docking tissue centrally loosening up in clearing away. However she does have the beginnings of a deep tissue injury on the left lateral malleolus likely due to the fact we've been trying offload the right as much as we have. I think she may benefit from an assistive soft device to help with offloading and it looks like they're looking at one of the doughnut conditions that wraps around the lower leg to offload which I think will definitely do a good job. With that being said I think we definitely need to address this issue on the left before it becomes a wound. Patient is not having significant pain. 04/19/18 on evaluation today patient appears to be doing excellent in regard to the progress she'[name] made with her right lateral ankle ulcer. The left ankle region which did show evidence of a deep tissue injury seems to be resolving there'[name] little fluid noted underneath and a blister there'[name] nothing open at this point in time overall I feel like this is progressing nicely which is good news. She does not seem to be having significant discomfort at this point which is also good news. 04/25/18-She is here in follow up evaluation for bilateral lateral malleolar ulcers. The right lateral malleolus ulcer with pale subcutaneous tissue exposure, central area of ulcer with tendon/periosteum exposed. The left lateral malleolus ulcer now with central area of nonviable tissue, otherwise deep tissue injury. She is wearing compression wraps to the left lower extremity, she will place the right lower extremity compression wraps on when she gets home. She will be out of town over the weekend and return next week and follow-up appointment. She completed her doxycycline this morning 05/03/18 on evaluation today patient appears to be doing very well in regard to her right lateral ankle ulcer in general. At least she'[name] showing some signs of  improvement in this regard. Unfortunately she has some additional injury to the left lateral malleolus region which appears to be new likely even over the past several days. Again this determination is based on the overall appearance. With that being said the patient is obviously frustrated about this currently. 05/10/18-She is here in follow-up evaluation for bilateral lateral malleolar ulcers. She states she has purchased offloading shoes/boots and they will arrive tomorrow. She was asked to bring them in the office at next week'[name] appointment so her provider is aware of product being utilized. She continues to sleep on right or left side, she has been encouraged to sleep on her back. The right lateral malleolus ulcer is precariously close to peri-osteum; will order xray. The left lateral malleolus ulcer is improved. Will switch back to santyl; she will follow up next week. 05/17/18 on evaluation today patient actually appears to be doing very well in regard to her malleolus her ulcers compared to last time I saw them. She does not seem to have as much in the way of contusion at this point which is great news. With that being said she does continue to have discomfort and I do believe that she is still continuing to benefit from the offloading/pressure reducing boots that were recommended. I think this is the key to  trying to get this to heal up completely. 05/24/18 on evaluation today patient actually appears to be doing worse at this point in time unfortunately compared to her last week'[name] evaluation. She is having really no increased pain which is good news unfortunately she does have more maceration in your theme and noted surrounding the right lateral ankle the left lateral ankle is not really is erythematous I do not see signs of the overt cellulitis on that side. Unfortunately the wounds do not seem to have shown any signs of improvement since the last evaluation. She also has significant swelling  especially on the right compared to previous some of this may be due to infection however also think that she may be served better while she has these wounds by compression wrapping versus continuing to use the Juxta-Lite for the time being. Especially with the amount of drainage that she is experiencing at this point. No fevers, chills, nausea, or vomiting noted at this time. 05/31/18 on evaluation today patient appears to actually be doing better in regard to her right lateral lower extremity ulcer specifically on the malleolus region. She has been tolerating the antibiotic without complication. With that being said she still continues to have issues but a little bit of redness although nothing like she what she was experiencing previous. She still continues to pressure to her ankle area she did get the problem on offloading boots unfortunately she will not wear them she states there too uncomfortable and she can't get in and out of the bed. Nonetheless at this point her wounds seem to be continually getting worse which is not what we want I'm getting somewhat concerned about her progress and how things are going to proceed if we do not intervene in some way shape or form. I therefore had a very lengthy conversation today about offloading yet again and even made a specific suggestion for switching her to a memory foam mattress and even gave the information for a specific one that they could look at getting if it was something that they were interested in considering. She does not want to be considered for a hospital bed air mattress although honestly insurance would not cover it that she does not have any wounds on her trunk. 06/14/18 on evaluation today both wounds over the bilateral lateral malleolus her ulcers appear to be doing better there'[name] no evidence of pressure injury at this point. She did get the foam mattress for her bed and this does seem to have been extremely beneficial for her in my  pinion. Her daughter states that she is having difficulty getting out of bed because of how soft it is. The patient also relates this to be. Nonetheless I do feel like she'[name] actually doing better. Unfortunately right after and around the time she was getting the mattress she also sustained a fall when she got up to go pick up the phone and ended up injuring her right elbow she has 18 sut[address] place. We are not caring for this currently although home health is going to be taking the sutures out shortly. Nonetheless this may be something that we need to evaluate going forward. It depends on how well it has or has not healed in the end. She also recently saw an orthopedic specialist for an injection in the right shoulder just before her fall unfortunately the fall seems to have worsened her pain. 06/21/18 on evaluation today patient appears to be doing about the same in regard to her  lateral malleolus ulcers. Both appear to be just a little bit deeper but again we are clinging away the necrotic and dead tissue which I think is why this is progressing towards a deeper realm as opposed [name], [name]. ([id number]) to improving from my measurement standpoint in that regard. Nonetheless she has been tolerating the dressing changes she absolutely hates the memory foam mattress topper that was obtained for her nonetheless I do believe this is still doing excellent as far as taking care of excess pressure in regard to the lateral malleolus regions. She in fact has no pressure injury that I see whereas in weeks past it was week by week I was constantly seeing new pressure injuries. Overall I think it has been very beneficial for her. 07/03/18; patient arrives in my clinic today. She has deep punched out areas over her bilateral lateral malleoli. The area on the right has some more depth. We spent a lot of time today talking about pressure relief for these areas. This started when her daughter asked for a  prescription for a memory foam mattress. I have never written a prescription for a mattress and I don't think insurances would pay for that on an ordinary bed. In any case he came up that she has foam boots that she refuses to wear. I would suggest going to these before any other offloading issues when she is in bed. They say she is meticulous about offloading this the rest of the day 07/10/18- She is seen in follow-up evaluation for bilateral, lateral malleolus ulcers. There is no improvement in the ulcers. She has purchased and is sleeping on a memory foam mattress/overlay, she has been using the offloading boots nightly over the past week. She has a follow up appointment with vascular medicine at the end of October, in my opinion this follow up should be expedited given her deterioration and suboptimal TBI results. We will order plain film xray of the left ankle as deeper structures are palpable; would consider having MRI, regardless of xray report([name]). The ulcers will be treated with iodoflex/iodosorb, she is unable to safely change the dressings daily with santyl. 07/19/18 on evaluation today patient appears to be doing in general visually well in regard to her bilateral lateral malleolus ulcers. She has been tolerating the dressing changes without complication which is good news. With that being said we did have an x-ray performed on 07/12/18 which revealed a slight loosen see in the lateral portion of the distal left fibula which may represent artifact but underline lytic destruction or osteomyelitis could not be excluded. MRI was recommended. With that being said we can see about getting the patient scheduled for an MRI to further evaluate this area. In fact we have that scheduled currently for August 20 19,019. 07/26/18 on evaluation today patient'[name] wound on the right lateral ankle actually appears to be doing fairly well at this point in my pinion. She has made some good progress currently. With  that being said unfortunately in regard to the left lateral ankle ulcer this seems to be a little bit more problematic at this time. In fact as I further evaluated the situation she actually had bone exposed which is the first time that'[name] been the case in the bone appear to be necrotic. Currently I did review patient'[name] note from Dr. Bunnie Domino office with [facility]. He stated that ABI was 1.26 on the right and 0.95 on the left with good waveforms. Her perfusion is stable not  reduced from previous studies and her digital waveforms were pretty good particularly on the right. His conclusion upon review of the note was that there was not much she could do to improve her perfusion and he felt she was adequate for wound healing. His suggestion was that she continued to see [place] and consider a synthetic skin graft if there was no underlying infection. He plans to see her back in six months or as needed. 08/01/18 on evaluation today patient appears to be doing better in regard to her right lateral ankle ulcer. Her left lateral ankle ulcer is about the same she still has bone involvement in evidence of necrosis. There does not appear to be evidence of infection at this time On the right lateral lower extremity. I have started her on the Augmentin she picked this up and started this yesterday. This is to get her through until she sees infectious disease which is scheduled for 08/12/18. 08/06/18 on evaluation today patient appears to be doing rather well considering my discussion with patient'[name] daughter at the end of last week. The area which was marked where she had erythema seems to be improved and this is good news. With that being said overall the patient seems to be making good improvement when it comes to the overall appearance of the right lateral ankle ulcer although this has been slow she at least is coming around in this regard. Unfortunately in regard to the left lateral ankle ulcer this  is osteomyelitis based on the pathology report as well is bone culture. Nonetheless we are still waiting CT scan. Unfortunately the MRI we originally ordered cannot be performed as the patient is a pacemaker which I had overlooked. Nonetheless we are working on the CT scan approval and scheduling as of now. She did go to the hospital over the weekend and was placed on IV Cefzo for a couple of days. Fortunately this seems to have improved the erythema quite significantly which is good news. There does not appear to be any evidence of worsening infection at this time. She did have some bleeding after the last debridement therefore I did not perform any sharp debridement in regard to left lateral ankle at this point. Patient has been approved for a snap vac for the right lateral ankle. 08/14/18; the patient with wounds over her bilateral lateral malleoli. The area on the right actually looks quite good. Been using a snap back on this area. Healthy granulation and appears to be filling in. Unfortunately the area on the left is really problematic. 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 Also noted to have cellulitis. She saw Dr. Novella Olive of infectious disease today and was put on linezolid. We are able to verify this with her pharmacy. She is completed the Augmentin that she was already on. We've been using Iodoflex to this area 08/23/18 on evaluation today patient'[name] wounds both actually appear to be doing better compared to my prior evaluations. Fortunately she showing signs of good improvement in regard to the overall wound status especially where were using the snap vac on the right. In regard to left lateral malleolus the wound bed actually appears to be much cleaner than previously noted. I do not feel any phone directly probed during evaluation today and though there is tendon noted this does not appear to be necrotic  it'[name] actually fairly good as far as the overall appearance of the tendon  is concerned. In general the wound bed actually appears to be doing significantly better than it was previous. Patient is currently in the care of [name] and I did review that note today. He actually has her on two weeks of linezolid and then following the patient will be on 1-2 months of Keflex. That is the plan currently. She has been on antibiotics therapy as prescribed by myself initially starting on July 30, 2018 and has been on that continuously up to this point. 08/30/18 on evaluation today patient actually appears to be doing much better in regard to her right lateral malleolus ulcer. She has been tolerating the dressing changes specifically the snap vac without complication although she did have some issues with the seal currently. Apparently there was some trouble with getting it to maintain over the past week past Sunday. Nonetheless overall the wound appears better in regard to the right lateral malleolus region. In regard to left lateral malleolus this actually show some signs of additional granulation although there still tendon noted in the base of the wound this appears to be healthy not necrotic in any way whatsoever. We are considering potentially using a snap vac for the left lateral malleolus as well the product wrap from [employer], [place], was present in the clinic today we're going to see this patient I did have her come in with me after obtaining consent from the patient and her daughter in order to look at the wound and see if there'[name] any recommendation one way or another as to whether or not they felt the snapback could be beneficial for the left lateral malleolus region. But the conclusion was that it might be but that this is definitely a little bit deeper wound than what traditionally would be utilized for a snap vac. 09/06/18 on evaluation today patient actually appears to be doing excellent in my pinion  in regard to both ankle ulcers. She has been tolerating the dressing changes without complication which is great news. Specifically we have been using the snap vac. In regard to the right ankle I'm not even sure that this is going to be necessary for today and following as the wound has filled in quite nicely. In regard to the left ankle I do believe [name], [name] ([id number]) that we're seeing excellent epithelialization from the edge as well as granulation in the central portion the tendon is still exposed but there'[name] no evidence of necrotic bone and in general I feel like the patient has made excellent progress even compared to last week with just one week of the snap vac. 09/11/18; this is a patient who has wounds on her bilateral lateral malleoli. Initially both of these were deep stage IV wounds in the setting of chronic arterial insufficiency. She has been revascularized. As I understand think she been using snap vacs to both of these wounds however the area on the right became more superficial and currently she is only using it on the left. Using silver collagen on the right and silver collagen under the back on the left I believe 09/19/18 on evaluation today patient actually appears to be doing very well in regard to her lateral malleolus or ulcers bilaterally. She has been tolerating the dressing changes without complication. Fortunately there does not appear to be any evidence of infection at this time. Overall I feel like she is improving in an excellent manner and I'm very pleased with the fact that everything seems to be turning towards the better for  her. This has obviously been a long road. 09/27/18 on evaluation today patient actually appears to be doing very well in regard to her bilateral lateral malleolus ulcers. She has been tolerating the dressing changes without complication. Fortunately there does not appear to be any evidence of infection at this time which is also great  news. No fevers, chills, nausea, or vomiting noted at this time. Overall I feel like she is doing excellent with the snap vac on the left malleolus. She had 40 mL of fluid collection over the past week. 10/04/18 on evaluation today patient actually appears to be doing well in regard to her bilateral lateral malleolus ulcers. She continues to tolerate the dressing changes without complication. One issue that I see is the snap vac on the left lateral malleolus which appears to have sealed off some fluid underlying this area and has not really allowed it to heal to the degree that I would like to see. For that reason I did suggest at this point we may want to pack a small piece of packing strip into this region to allow it to more effectively wick out fluid. 10/11/18 in general the patient today does not feel that she has been doing very well. She'[name] been a little bit lethargic and subsequently is having bodyaches as well according to what she tells me today. With that being said overall she has been concerned with the fact that something may be worsening although to be honest her wounds really have not been appearing poorly. She does have a new ulcer on her left heel unfortunately. This may be pressure related. Nonetheless it seems to me to have potentially started at least as a blister I do not see any evidence of deep tissue injury. In regard to the left ankle the snap vac still seems to be causing the ceiling off of the deeper part of the wound which is in turn trapping fluid. I'm not extremely pleased with the overall appearance as far as progress from last week to this week therefore I'm gonna discontinue the snap vac at this point. 10/18/18 patient unfortunately this point has not been feeling well for the past several days. She was seen by Grayland Ormond her primary care provider who is a Librarian, academic at [facility]. Subsequently she states that she'[name] been very weak and generally feeling  malaise. No fevers, chills, nausea, or vomiting noted at this time. With that being said bloodwork was performed at the PCP office on the 11th of this month which showed a white blood cell count of 10.7. This was repeated today and shows a white blood cell count of 12.4. This does show signs of worsening. Coupled with the fact that she is feeling worse and that her left ankle wound is not really showing signs of improvement I feel like this is an indication that the osteomyelitis is likely exacerbating not improving. Overall I think we may also want to check her C-reactive protein and sedimentation rate. Actually did call [facility] office this afternoon while the patient was in the office here with me. Subsequently based on the findings we discussed treatment possibilities and I think that it is appropriate for [place] to go ahead and initiate treatment with doxycycline which I'm going to do. Subsequently he did agree to see about adding a CRP and sedimentation rate to her orders. If that has not already been drawn to where they can run it they will contact the patient she can come back to  have that check. They are in agreement with plan as far as the patient and her daughter are concerned. Nonetheless also think we need to get in touch with Dr. Henreitta Leber office to see about getting the patient scheduled with him as soon as possible. 11/08/18 on evaluation today patient presents for follow-up concerning her bilateral foot and ankle ulcers. I did do an extensive review of her chart in epic today. Subsequently she was seen by [name] he did initiate Cefepime IV antibiotic therapy. Subsequently she had some issues with her PICC line this had to be removed because it was coiled and then replaced. Fortunately that was now settled. Unfortunately she has continued have issues with her left heel as well as the issues that she is experiencing with her bilateral lateral malleolus regions. I do believe however  both areas seem to be doing a little bit better on evaluation today which is good news. No fevers, chills, nausea, or vomiting noted at this time. She actually has an angiogram schedule with Dr. dew on this coming Monday, November 11, 2018. Subsequently the patient states that she is feeling much better especially than what she was roughly 2 weeks ago. She actually had to cancel an appointment because she was feeling so poorly. No fevers, chills, nausea, or vomiting noted at this time. 11/15/18 on evaluation today patient actually is status post having had her angiogram with Dr. dew Monday, four days ago. 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 He had to go and work on several areas of the vasculature fortunately he was able to obtain no more than a 30% residual stenosis throughout post procedure. I reviewed this note today. I think this will definitely help with healing at this time. Fortunately there does not appear to be any signs of infection and I do feel like ratio already has a better appearance to it. 11/22/18 upon evaluation today patient actually appears to be doing very well in regard to her wounds in general. The right lateral malleolus looks excellent the heel looks better in the left lateral malleolus also appears to be doing a little better. With that being said the right second toe actually appears to be open and training we been watching this is been dry and stable but now is open. 12/03/2018 Seen today for follow-up and management of multiple bilateral lower extremity wounds. New pressure injury of the great toe which is closed at this time. Wound of the right distal second toe appears larger today with deep undermining and a pocket of fluid present within the undermining region. Left and right malleolus is wounds are stable today with no signs and symptoms of infection.Denies any needs or concerns during exam today. 12/13/18 on evaluation today patient  appears to be doing somewhat better in regard to her left heel ulcer. She also seems to be completely healed in regard to the right lateral malleolus ulcer. The left malleolus ulcer is smaller what unfortunately the wounds which are new over the first and second toes of the right foot are what are most concerning at this point especially the second. Both areas did require sharp debridement today. 12/20/18 on evaluation today patient'[name] wound actually appears to be doing better in regard to left lateral ankle and her right lateral ankle continues to remain healed. The hill ulcer on the left is improved. She does have improvement noted as well in regard to both toe ulcers. Overall I'm very pleased in this  regard. No fevers, chills, nausea, or vomiting noted at this time. 12/23/18 on evaluation today patient is seen after she had her toenails trimmed at the podiatrist office due to issues with her right great toe. There was what appeared to be dark eschar on the surface of the wound which had her in the podiatrist concerned. Nonetheless as I remember that during the last office visit I had utilize silver nitrate of this area I was much less concerned about the situation. Subsequently I was able to clean off much of this tissue without any complication today. This does not appear to show any signs of infection and actually look somewhat better Deckard, [name] ([id number]) compared to last time post debridement. Her second toe on the right foot actually had callous over and there did appear still be some fluid underneath this that would require debridement today. 12/27/18 on evaluation today patient actually appears to be showing signs of improvement at all locations. Even the left lateral ankle although this is not quite as great as the other sites. Fortunately there does not appear to be any signs of infection at this time and both of her toes on the right foot seem to be showing signs of improvement  which is good news and very pleased in this regard. 01/03/19 on evaluation today patient appears to be doing better for the most part in regard to her wounds in particular. There does not appear to be any evidence of infection at this time which is good news. Fortunately there is no sign of really worsening anywhere except for the right great toe which she does have what appears to be a bruise/deep tissue injury which is very superficial and already resolving. I'm not sure where this came from I questioned her extensively and she does not recall what may have happened with this. Other than that the patient seems to be doing well even the left lateral ankle ulcer looks good and is getting smaller. 01/10/19 on evaluation today patient appears to be doing well in regard to her left heel wound and both of her toe wounds. Overall I feel like there is definitely improvement here and I'm happy in that regard. With that being said unfortunately she is having issues with the left lateral malleolus ulcer which unfortunately still has a lot of depth to it. This is gonna be a very difficult wound for [place] to be able to truly get to heal. I may want to consider some type of skin substitute to see if this would be of benefit for her. I'll discuss this with her more the next visit most likely. This was something I thought about more at the end of the visit when I was Artie out of the room and the patient had been discharged. 01/17/19 on evaluation today patient appears to be doing very well in regard to her wounds in general. She'[name] been making excellent progress at this time. Fortunately there'[name] no sign of infection at this time either. No fevers, chills, nausea, or vomiting noted at this time. The biggest issue is still her left lateral malleolus where it appears to be doing well and is getting smaller but still shows a small corner where this is deeper and goes down into what appears to be the joint space. Nonetheless  this is taking much longer to heal although it still looks better in smaller than previous evaluations. 01/24/19 on evaluation today patient'[name] wounds actually appear to be doing rather well in general overall. She  did require some sharp debridement in regard to the right great toe but everything else appears to be doing excellent no debridement was even necessary. No fevers, chills, nausea, or vomiting noted at this time. 01/31/19 on evaluation today patient actually appears to be doing much better in regard to her left foot wound on the heel as well as the ankle. The right great toe appears to be a little bit worse today this had callous over and trapped a lot of fluid underneath. Fortunately there'[name] no signs of infection at any site which is great news. 02/07/19 on evaluation today patient actually appears to be doing decently well in regard to all of her ulcers at this point. No sharp debridement was required she is a little bit of hyper granulation in regard to the left lateral ankle as well as the left heel but the hill itself is almost completely healed which is excellent news. Overall been very pleased in this regard. 02/14/19 on evaluation today patient actually appears to be doing very well in regard to her ulcers on the right first toe, left lateral malleolus, and left heel. In fact the heel is almost completely healed at this point. The patient does not show any signs of infection which is good news. Overall very pleased with how things have progressed. 04/18/19 Telehealth Evaluation During the COVID-19 National Emergency: Verbal Consent: Obtained from patient Allergies: reviewed and the active list is current. Medication changes: patient has no current medication changes. COVID-19 Screening: 1. Have you traveled internationally or on a cruise ship in the last 14 dayso No 2. Have you had contact with someone with or under investigation for COVID-19o No 3. Have you had a fever, cough, sore  throat, or experiencing shortness of breatho No on evaluation today actually did have a visit with this patient through a telehealth encounter with her home health nurse. Subsequently it was noted that the patient actually appears to be doing okay in regard to her wounds both the right great toe as well as the left lateral malleolus have shown signs of improvement although this in your theme around the left lateral malleolus there eschar coverings for both locations. The question is whether or not they are actually close and whether or not home health needs to discharge the patient or not. Nonetheless my concern is this point obviously is that without actually seeing her and being able to evaluate this directly I cannot ensure that she is completely healed which is the question that I'm being asked. 04/22/19 on evaluation today patient presents for her first evaluation since last time I saw her which was actually February 14, 2019. I did do a telehealth visit last week in which point it was questionable whether or not she may be healed and had to bring her in today for confirmation. With that being said she does seem to be doing quite well at this point which is good news. There does not appear to be any drainage in the deed I believe her wounds may be healed. Readmission: 09/04/2019 on evaluation today patient appears to be doing unfortunately somewhat more poorly in regard to her left foot ulcer secondary to a wound that began on 08/21/2019 at least when she first noticed this. Fortunately she has not had any evidence of active infection at this time. Systemically. I also do not necessarily see any evidence of infection at the blister/wound site on the first metatarsal head plantar aspect. This almost appears to be something that may  have just rubbed inappropriately causing this to breakdown. They did not want a wait too long to come in to be seen as again she had significant issues in the past with  wounds that took quite a while to heal in fact it was close to 2 years. Nonetheless this does not appear to be quite that bad but again we do need to remove some of the necrotic tissue from the surface of the wound to tell exactly the extent. She does not appear to have any significant arterial disease at this point and again her last ABIs and TBI'[name] are recorded above in the alert section her left ABI was 1.27 with a TBI of 0.72 to the right ABI 1.08 with a TBI of 0.39. Other than this the patient has been doing quite well since I last saw her and that was in May 2020. 09/11/2019 on evaluation today patient appeared to be doing very well with regard to her plantar foot ulcer on the left. In fact this appears to be almost completely healed which is awesome. That is after just 1 week of intervention. With that being said there is no signs of active infection at this time. [name], [name] ([id number]) 09/18/2019 on evaluation today patient actually appears to be doing excellent in fact she is completely healed based on what I am seeing at this point. Fortunately there is no signs of active infection at this time and overall patient is very pleased to hear that this area has healed so quickly. Readmission: 05/13/2021 upon evaluation today this patient presents for reevaluation here in the clinic. This is a wound that actually we previously took care of. She had 1 on the right ankle and the left the left turned out to be be harder due to to heal but nonetheless is doing great at this point as the right that has reopened and it was noted first just several weeks ago with a scab over it and came off in just the past few days. Fortunately there does not appear to be any obvious evidence of significant active infection at this time which is great news. No fevers, chills, nausea, vomiting, or diarrhea. 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 There does not  appear to be any signs of this interfering in any way with her wound. She does have swelling we previously had compression socks for her ordered but again it does not look like she wears these on a regular basis by any means. 05/26/2021 upon evaluation today patient appears to be doing well with regard to her wound which is actually showing signs of excellent improvement. There does not appear to be any signs of active infection which is great news and overall very pleased with where things stand today. No fevers, chills, nausea, vomiting, or diarrhea. 06/02/2021 upon evaluation today patient'[name] wound actually showing signs of excellent improvement. Fortunately there does not appear to be any signs of active infection which is great news. I think the patient is making good progress with regard to her wounds in general. 06/09/2021 upon evaluation today patient appears to be doing excellent in regard to her wounds currently. Fortunately there does not appear to be any signs of active infection which is great news. No fevers, chills, nausea, vomiting, or diarrhea. Overall extremely pleased with where things stand today. I think the patient is making excellent progress. 06/16/2021 upon evaluation today patient appears to be doing well in regard  to her wound. This is going require little bit of debridement today and that was discussed with the patient. Otherwise she seems to be doing quite well and I am actually very pleased with where things stand at this point. No fevers, chills, nausea, vomiting, or diarrhea. 06/23/2021 upon evaluation today patient appears to be doing well with regard to her wounds. She has been tolerating the dressing changes without complication. Fortunately there does not appear to be any evidence of infection and she has not had air in her home which she actually lives at an assisted living that got fixed this morning. With that being said because of that her wrap has been extremely hot  and bothersome for her over the past week. 06/30/2021 upon evaluation today patient is actually making excellent progress in regard to her ankle ulcer. She has been tolerating the dressing changes without complication and overall extremely pleased with where things stand there does not appear to be any evidence of active infection which is great news. No fevers, chills, nausea, vomiting, or diarrhea. 07/07/21 upon evaluation today patients and culture on the right actually appears to be doing quite well. There does not appear to be any signs of infection and overall very pleased with where things stand today. No fevers, chills, nausea, or vomiting noted at this time. 07/14/2021 unfortunately the patient today has some evidence of deep tissue injury and pressure getting to the ankle region. Again I am not exactly sure what is going on here but this is very similar to issues that we have had in the past. I explained to the patient that she needs to be very mindful of exactly what is happening I think sleeping in bed is probably the main issue here although there could be other culprits I am not sure what else would potentially lead to this kind of a problem for her. 07/21/2021 upon evaluation today patient'[name] wound actually showing signs of improvement compared to last week. Fortunately there does not appear to be any signs of active infection which is great news and overall very pleased with where things stand in that regard. With that being said I do believe that she is continuing to show signs of overall of getting better although I think this is still basically about what we were 2 weeks ago due to the worsening and now improvement. 07/28/2021 upon evaluation today patient appears to be doing well with regard to her wound. She does have some slough buildup on the surface of the wound which I would have to manage today. Fortunately there is no sign of active infection at this time. No fevers, chills,  nausea, vomiting, or diarrhea. 08/04/2021 upon evaluation today patient appears to be doing about the same in regard to her wound. To be perfectly honest I am beginning to be a little bit concerned about the overall appearance of the wound bed. I do think possibly taking a sample right around the margin of the wound could be beneficial for her as far as identifying anything such as an inflammatory process or to be honest even a skin tag cancer type process that may be of concern here. Fortunately there does not appear to be any evidence of active infection at this time which is great news she is not having any pain also great news. 08/11/2021 upon evaluation today patient appears to be doing well with regard to her wound. The good news is I did review her biopsy results and it showed some inflammatory mixed  findings but nothing that appeared to be malignant which is great news. Overall this is more of a chronic venous stasis type issue which again is more what we have been treating. Nonetheless I just wanted to make sure before going forward that there was not anything more untoward going on at this point. 08/18/2021 upon evaluation today patient appears to be doing well with regard to her ankle ulcer. Fortunately there does not appear to be any signs of active infection at this time which is great overall wound is dramatically improved compared to last week. Since last week I have actually placed her on doxycycline and subsequently this is a good option as far as the findings are concerned at this point. 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 the doxycycline is doing that. There does not appear to be any evidence of active infection systemically which is great news. 08/25/2021 upon evaluation today patient appears to be doing well with regard to her wound. I feel like we are  finally get back on track as far as healing is concerned I am much happier with the overall appearance today. I do think that she is tolerating the dressing changes without complication which is great news. We have been using Hydrofera Blue which I think is a good option. The good news is she is also doing great in regard to her compression sock on the left which is a zipper compression that seems to be doing a great job keeping her edema under good control. 09/01/2021 upon evaluation today patient appears to be doing well with regard to her wound. Infection seems to be under much better control which is great news and very pleased in that regard. Fortunately there does not appear to be any signs of infection currently. [name], [name] ([id number]) 09/08/2021 upon evaluation today patient actually appears to be making good progress in regard to her wound. She has been tolerating the dressing changes without complication. Fortunately there does not appear to be any evidence of active infection at this time which is great news as well. No fevers, chills, nausea, vomiting, or diarrhea. 09/22/2021 upon evaluation today patient appears to be doing well with regard to her wound although is very slow to heal. We have not looked into Apligraf yet I think that is something that we should see about doing. 09/29/2021 upon evaluation today patient'[name] wound is actually showing signs of doing about the same. I am not seeing any evidence of worsening but also no significant evidence of improvement. We did gain approval for the organogenesis products all except for Apligraf as covered by her insurance. With that being said I do think that we can go ahead and proceed with the NuShield if the patient and her family in agreement of the plan I discussed that with him today she does have a 20% coinsurance which we also discussed. 10/06/2021 upon evaluation today patient appears to unfortunately be doing a little bit  worse she appears to be infected based on what I am seeing. Fortunately there does not appear to be any signs of active infection at this time which is great news. Unfortunately it does appear to be some evidence of around the wound edge indicated by way of erythema and warmth as well as redness 10/13/2021 upon evaluation today patient actually appears to be doing excellent in regard to  her ankle ulcer compared to what it was. 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 I think were fine to go ahead and apply the NuShield today. 10/20/2021 upon evaluation today patient appears to be doing excellent in fact the NuShield seems to have done all some for her thus far. I am actually very pleased with where we stand and overall I think that she is making great progress. There is no evidence of active infection at this time. 11/23; patient presents for follow-up. She has no issues or complaints today. She denies signs of infection. She reports stability in her wound healing. 11/03/2021 upon evaluation today patient appears to be doing well with regard to her wounds. She has been tolerating the dressing changes without complication. Fortunately there is no signs of active infection at this time. 11/10/2021 upon evaluation today patient appears to be doing well with regard to her right ankle which is actually showing signs of improvement with the NuShield I am very pleased. Subsequently in regards to the left ankle this appears to be doing okay with no evidence of issue here either. With that being said she has a significant contusion on the left leg from having fallen 2 days ago. She tells me that she was using her walker going to the closet and then when she got to the closet turned around to get something out at  which point she fell according to the story. Nonetheless she does have a hematoma just below her knee on the anterior portion of her shin. My hope is that this will not open until wound although we do need I think Some compression over it also think that we need to have her use an ice as well to help with the swelling and prevent this from getting worse. The last thing she needs is a wound opened up here. 11/17/2021 upon evaluation patient'[name] right ankle actually showing signs of improvement based on what I see currently there is a lot of new skin growth coming in which is great news. Nonetheless I do feel like that the patient is showing improvement as well in regard to her left leg with the use of the Tubigrip it swollen but not as bruised as what I would have expected after what I was seeing last week. Nonetheless I do think doubling up on the Tubigrip would probably be beneficial to try to keep some of the edema under better control here. 11/24/2021 upon evaluation today patient appears to be doing excellent in regard to her wound. This is actually measuring significantly smaller which is great news. Fortunately I do not see any signs of active infection locally nor systemically at this point. Overall I am very pleased with how the NuShield is doing. 12/30; wound bed looks healthy however not much change in overall wound volume. We applied Nushield again today in the standard fashion 12/08/2021 upon evaluation today patient'[name] wound actually showing signs of good improvement and I am actually very pleased with where we stand today as well. I do not see any evidence of active infection locally nor systemically which is great news. Unfortunately she has been having some issues with stomach upset and diarrhea today. 12/15/2021 upon evaluation today patient appears to be doing excellent in regard to her wound. There is a little bit  of dry skin raised up around the edges of the wound but fortunately  nothing too significant at this point. Fortunately I do not see any evidence of active infection either which is great news. No fevers, chills, nausea, vomiting, or diarrhea. 12/29/2021 upon evaluation today patient appears to be doing well with regard to her wound. In general I feel like she is getting very close to complete resolution. I think that she is making good progress here as well. Overall I do not see any signs of infection and very little of this appears to actually be open. 01/12/2022 upon evaluation today patient appears to be doing a little bit worse in regard to her wound. It appears that the collagen actually trapped fluid underneath the collagen which got hard and subsequently caused the fluid to back up. This patient has made the wound appear to be larger than what it was previous. With that being said I do not see any signs of obvious infection is not warm to touch and not significantly erythematous either which is good news. Nonetheless I do think that we will need to continue to keep an eye on this. Probably not go put on antibiotic this week but I will be too far from doing so she is actually on a Z-Pak right now I do not want to really double up on antibiotics. 01/26/2022 upon evaluation today patient'[name] wound is showing signs of improvement although this is slow to turn back around. Fortunately there does not appear to be any evidence of active infection locally or systemically which is great news and overall I am extremely pleased with where we stand today. No fevers, chills, nausea, vomiting, or diarrhea. Electronic Signature([name]) Signed: 01/26/2022 10:53:14 AM By: Worthy Keeler PA-C Entered By: Worthy Keeler on 01/26/2022 10:53:14 Bellard, [name] ([id number]) -------------------------------------------------------------------------------- Physical Exam Details Patient Name: CAMEY, EDELL Date of Service: 01/26/2022 10:15 AM Medical Record Number:  [id number] Patient Account Number: [id number] Date of Birth/Sex: 1923/11/22 (88 y.o. F) Treating RN: Carlene Coria Primary Care Provider: Adrian Prows Other Clinician: Referring Provider: Adrian Prows Treating Provider/Extender: Skipper Cliche in Treatment: 91 Constitutional Well-nourished and well-hydrated in no acute distress. Respiratory normal breathing without difficulty. Psychiatric this patient is able to make decisions and demonstrates good insight into disease process. Alert and Oriented x 3. pleasant and cooperative. Notes Patient'[name] wound bed did require some sharp debridement to clear away some of the slough and biofilm from the surface of the wound. She tolerated this debridement today with minimal discomfort postdebridement this appears to be doing much better in general I feel like we are headed in the right direction. Electronic Signature([name]) Signed: 01/26/2022 10:53:32 AM By: Worthy Keeler PA-C Entered By: Worthy Keeler on 01/26/2022 10:53:31 [name], [name] ([id number]) -------------------------------------------------------------------------------- Physician Orders Details Patient Name: [name] Date of Service: 01/26/2022 10:15 AM Medical Record Number: [id number] Patient Account Number: [id number] Date of Birth/Sex: Aug 02, 1923 (88 y.o. 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 Bathing/ Shower/ Hygiene o May shower with wound dressing  protected with water repellent cover or cast protector. Edema Control -  Lymphedema / Segmental Compressive Device / Other o Optional: One layer of unna paste to top of compression wrap (to act as an anchor). - and at toes o Patient to wear own compression stockings. Remove compression stockings every night before going to bed and put on every morning when getting up. - left leg o Elevate, Exercise Daily and Avoid Standing for Long Periods of Time. o Elevate legs to the level of the heart and pump ankles as often as possible o Elevate leg([name]) parallel to the floor when sitting. Wound Treatment Wound #7 - Malleolus Wound Laterality: Right, Lateral Cleanser: Soap and Water 1 x Per Week/30 Days Discharge Instructions: Gently cleanse wound with antibacterial soap, rinse and pat dry prior to dressing wounds Primary Dressing: Algicell Calcium Alginate Dressing 2x2 (in/in) 1 x Per Week/30 Days Primary Dressing: Medihoney - Gel, 1.5 (oz), tube 1 x Per Week/30 Days Secondary Dressing: Gauze 1 x Per Week/30 Days Discharge Instructions: As directed: dry, moistened with saline or moistened with Dakins Solution Compression Wrap: Profore Lite LF 3 Multilayer Compression Bandaging System 1 x Per Week/30 Days Discharge Instructions: Apply 3 multi-layer wrap as prescribed. Electronic Signature([name]) Signed: 01/27/2022 4:05:24 PM By: Worthy Keeler PA-C Signed: 01/30/2022 8:01:43 AM By: Carlene Coria RN Entered By: Carlene Coria on 01/26/2022 10:44:48 [name], [name] ([id number]) -------------------------------------------------------------------------------- Problem List Details Patient Name: [name] Date of Service: 01/26/2022 10:15 AM Medical Record Number: [id number] Patient Account Number: [id number] Date of Birth/Sex: Nov 13, 1923 (88 y.o. 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, stage 3 05/13/2021 No Yes I89.0 Lymphedema, not elsewhere classified 05/13/2021 No Yes I87.2 Venous insufficiency (chronic) (peripheral) 05/13/2021 No Yes Z95.0 Presence of cardiac pacemaker 05/13/2021 No Yes Z79.01 Long term (current) use of anticoagulants 05/13/2021 No Yes Inactive Problems ICD-10 Code Description Active Date Inactive Date S80.12XA Contusion of left lower leg, initial encounter 11/10/2021 11/10/2021 Resolved Problems Electronic Signature([name]) Signed: 01/26/2022 10:17:56 AM By: Worthy Keeler PA-C Entered By: Worthy Keeler on 01/26/2022 10:17:56 [name], [name] ([id number]) -------------------------------------------------------------------------------- Progress Note Details Patient Name: [name] Date of Service: 01/26/2022 10:15 AM Medical Record Number: [id number] Patient Account Number: [id number] Date of Birth/Sex: Jul 21, 1923 (88 y.o. 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 Dr. Amalia Hailey the podiatrist saw her on 07/20/2017 and sent her to the wound center for possible hyperbaric oxygen therapy. past medical history of peripheral vascular disease, varicose veins, status post appendectomy, basal cell carcinoma excision from the left leg, cholecystectomy, pacemaker placement, right lower extremity angiography done by Dr. dew in March 2017  with placement of a stent. there is also note of a successful ablation of the right small saphenous vein done which was reviewed by ultrasound on 10/24/2016. the patient had a right small saphenous vein ablation done on 10/20/2016. The patient has never been a smoker. She has been seen by Dr. Corene Cornea dew the vascular surgeon who most recently saw her on 06/15/2017 for evaluation of ongoing  problems with right leg swelling. She had a lower extremity arterial duplex examination done(02/13/17) which showed patent distal right superficial femoral artery stent and above-the-knee popliteal stent without evidence of restenosis. The ABI was more than 1.3 on the right and more than 1.3 on the left. This was consistent with noncompressible arteries due to medial calcification. The right great toe pressure and PPG waveforms are within normal limits and the left great toe pressure and PPG waveforms are decreased. he recommended she continue to wear her compression stockings and continue with elevation. She is scheduled to have a noninvasive arterial study in the near future 08/16/2017 -- had a lower extremity arterial duplex examination done which showed patent distal right superficial femoral artery stent and above-the-knee popliteal stent without evidence of restenosis. The ABI was more than 1.3 on the right and more than 1.3 on the left. This was consistent with noncompressible arteries due to medial calcification. The right great toe pressure and PPG waveforms are within normal limits and the left great toe pressure and PPG waveforms are decreased. the x-ray of the right ankle has not yet been done 08/24/2017 -- had a right ankle x-ray -- IMPRESSION:1. No fracture, bone lesion or evidence of osteomyelitis. 2. Lateral soft tissue swelling with a soft tissue ulcer. she has not yet seen the vascular surgeon for review 08/31/17 on evaluation today patient'[name] wound appears to be showing signs of improvement. She  still with her appointment with vascular in order to review her results of her vascular study and then determine if any intervention would be recommended at that time. No fevers, chills, nausea, or vomiting noted at this time. She has been tolerating the dressing changes without complication. 09/28/17 on evaluation today patient'[name] wound appears to show signs of good improvement in regard to the granulation tissue which is surfacing. There is still a layer of slough covering the wound and the posterior portion is still significantly deeper than the anterior nonetheless there has been some good sign of things moving towards the better. She is going to go back to Dr. dew for reevaluation to ensure her blood flow is still appropriate. That will be before her next evaluation with [place] next week. No fevers, chills, nausea, or vomiting noted at this time. Patient does have some discomfort rated to be a 3-4/10 depending on activity specifically cleansing the wound makes it worse. 10/05/2017 -- the patient was seen by Dr. Lucky Cowboy last week and noninvasive studies showed a normal right ABI with brisk triphasic waveforms consistent with no arterial insufficiency including normal digital pressures. The duplex showed a patent distal right SFA stent and the proximal SFA was also normal. He was pleased with her test and thought she should have enough of perfusion for normal wound healing. He would see her back in 6 months time. 12/21/17 on evaluation today patient appears to be doing fairly well in regard to her right lateral ankle wound. Unfortunately the main issue that she is expansion at this point is that she is having some issues with what appears to be some cellulitis in the right anterior shin. She has also been noting a little bit of uncomfortable feeling especially last night and her ankle area. I'm afraid that she made the developing a little bit of an infection. With that being said I think it is in the early  stages. 12/28/17 on evaluation today patient'[name] ankle appears to be doing excellent. She'[name] making good progress at this point the cellulitis seems to have improved  after last week'[name] evaluation. Overall she is having no significant discomfort which is excellent news. She does have an appointment with Dr. dew on March 29, 2018 for reevaluation in regard to the stent he placed. She seems to have excellent blood flow in the right lower extremity. 01/19/12 on evaluation today patient'[name] wound appears to be doing very well. In fact she does not appear to require debridement at this point, there'[name] no evidence of infection, and overall from the standpoint of the wound she seems to be doing very well. With that being said I believe that it may be time to switch to different dressing away from the [facility] Dressing she tells me she does have a lot going on her friend actually passed away yesterday and she'[name] also having a lot of issues with her husband this obviously is weighing heavy on her as far as your thoughts and concerns today. 01/25/18 on evaluation today patient appears to be doing fairly well in regard to her right lateral malleolus. She has been tolerating the dressing changes without complication. Overall I feel like this is definitely showing signs of improvement as far as how the overall appearance of the wound is there'[name] also evidence of epithelium start to migrate over the granulation tissue. In general I think that she is progressing nicely as far as the wound is concerned. The only concern she really has is whether or not we can switch to every other week visits in order to avoid having as many [name], [name] ([id number]) appointments as her daughters have a difficult time getting her to her appointments as well as the patient'[name] husband to his he is not doing very well at this point. 02/22/18 on evaluation today patient'[name] right lateral malleolus ulcer appears to be doing great. She has  been tolerating the dressing changes without complication. Overall you making excellent progress at this time. Patient is having no significant discomfort. 03/15/18 on evaluation today patient appears to be doing much more poorly in regard to her right lateral ankle ulcer at this point. Unfortunately since have last seen her her husband has passed just a few days ago is obviously weighed heavily on her her daughter also had surgery well she is with her today as usual. There does not appear to be any evidence of infection she does seem to have significant contusion/deep tissue injury to the right lateral malleolus which was not noted previous when I saw her last. It'[name] hard to tell of exactly when this injury occurred although during the time she was spending the night in the hospital this may have been most likely. 03/22/18 on evaluation today patient appears to actually be doing very well in regard to her ulcer. She did unfortunately have a setback which was noted last week however the good news is we seem to be getting back on track and in fact the wound in the core did still have some necrotic tissue which will be addressed at this point today but in general I'm seeing signs that things are on the up and up. She is glad to hear this obviously she'[name] been somewhat concerned that due to the how her wound digressed more recently. 03/29/18 on evaluation today patient appears to be doing fairly well in regard to her right lower extremity lateral malleolus ulcer. She unfortunately does have a new area of pressure injury over the inferior portion where the wound has opened up a little bit larger secondary to the pressure she seems to be  getting. She does tell me sometimes when she sleeps at night that it actually hurts and does seem to be pushing on the area little bit more unfortunately. There does not appear to be any evidence of infection which is good news. She has been tolerating the dressing changes  without complication. She also did have some bruising in the left second and third toes due to the fact that she may have bump this or injured it although she has neuropathy so she does not feel she did move recently that may have been where this came from. Nonetheless there does not appear to be any evidence of infection at this time. 04/12/18 on evaluation today patient'[name] wound on the right lateral ankle actually appears to be doing a little bit better with a lot of necrotic docking tissue centrally loosening up in clearing away. However she does have the beginnings of a deep tissue injury on the left lateral malleolus likely due to the fact we've been trying offload the right as much as we have. I think she may benefit from an assistive soft device to help with offloading and it looks like they're looking at one of the doughnut conditions that wraps around the lower leg to offload which I think will definitely do a good job. With that being said I think we definitely need to address this issue on the left before it becomes a wound. Patient is not having significant pain. 04/19/18 on evaluation today patient appears to be doing excellent in regard to the progress she'[name] made with her right lateral ankle ulcer. The left ankle region which did show evidence of a deep tissue injury seems to be resolving there'[name] little fluid noted underneath and a blister there'[name] nothing open at this point in time overall I feel like this is progressing nicely which is good news. She does not seem to be having significant discomfort at this point which is also good news. 04/25/18-She is here in follow up evaluation for bilateral lateral malleolar ulcers. The right lateral malleolus ulcer with pale subcutaneous tissue exposure, central area of ulcer with tendon/periosteum exposed. The left lateral malleolus ulcer now with central area of nonviable tissue, otherwise deep tissue injury. She is wearing compression wraps to the  left lower extremity, she will place the right lower extremity compression wraps on when she gets home. She will be out of town over the weekend and return next week and follow-up appointment. She completed her doxycycline this morning 05/03/18 on evaluation today patient appears to be doing very well in regard to her right lateral ankle ulcer in general. At least she'[name] showing some signs of improvement in this regard. Unfortunately she has some additional injury to the left lateral malleolus region which appears to be new likely even over the past several days. Again this determination is based on the overall appearance. With that being said the patient is obviously frustrated about this currently. 05/10/18-She is here in follow-up evaluation for bilateral lateral malleolar ulcers. She states she has purchased offloading shoes/boots and they will arrive tomorrow. She was asked to bring them in the office at next week'[name] appointment so her provider is aware of product being utilized. She continues to sleep on right or left side, she has been encouraged to sleep on her back. The right lateral malleolus ulcer is precariously close to peri-osteum; will order xray. The left lateral malleolus ulcer is improved. Will switch back to santyl; she will follow up next week. 05/17/18 on evaluation today  patient actually appears to be doing very well in regard to her malleolus her ulcers compared to last time I saw them. She does not seem to have as much in the way of contusion at this point which is great news. With that being said she does continue to have discomfort and I do believe that she is still continuing to benefit from the offloading/pressure reducing boots that were recommended. I think this is the key to trying to get this to heal up completely. 05/24/18 on evaluation today patient actually appears to be doing worse at this point in time unfortunately compared to her last week'[name] evaluation. She is having  really no increased pain which is good news unfortunately she does have more maceration in your theme and noted surrounding the right lateral ankle the left lateral ankle is not really is erythematous I do not see signs of the overt cellulitis on that side. Unfortunately the wounds do not seem to have shown any signs of improvement since the last evaluation. She also has significant swelling especially on the right compared to previous some of this may be due to infection however also think that she may be served better while she has these wounds by compression wrapping versus continuing to use the Juxta-Lite for the time being. Especially with the amount of drainage that she is experiencing at this point. No fevers, chills, nausea, or vomiting noted at this time. 05/31/18 on evaluation today patient appears to actually be doing better in regard to her right lateral lower extremity ulcer specifically on the malleolus region. She has been tolerating the antibiotic without complication. With that being said she still continues to have issues but a little bit of redness although nothing like she what she was experiencing previous. She still continues to pressure to her ankle area she did get the problem on offloading boots unfortunately she will not wear them she states there too uncomfortable and she can't get in and out of the bed. Nonetheless at this point her wounds seem to be continually getting worse which is not what we want I'm getting somewhat concerned about her progress and how things are going to proceed if we do not intervene in some way shape or form. I therefore had a very lengthy conversation today about offloading yet again and even made a specific suggestion for switching her to a memory foam mattress and even gave the information for a specific one that they could look at getting if it was something that they were interested in considering. She does not want to be considered for  a hospital bed air mattress although honestly insurance would not cover it that she does not have any wounds on her trunk. 06/14/18 on evaluation today both wounds over the bilateral lateral malleolus her ulcers appear to be doing better there'[name] no evidence of pressure injury at this point. She did get the foam mattress for her bed and this does seem to have been extremely beneficial for her in my pinion. Her daughter states that she is having difficulty getting out of bed because of how soft it is. The patient also relates this to be. Nonetheless I do feel like she'[name] actually doing better. Unfortunately right after and around the time she was getting the mattress she also sustained a fall when she got up to go pick up the phone and ended up injuring her right elbow she has 18 sutures in place. We are not caring for this currently although home health  is going to be taking the sutures out shortly. Nonetheless this may be something that we need to evaluate going forward. It depends on [name], [name] ([id number]) how well it has or has not healed in the end. She also recently saw an orthopedic specialist for an injection in the right shoulder just before her fall unfortunately the fall seems to have worsened her pain. 06/21/18 on evaluation today patient appears to be doing about the same in regard to her lateral malleolus ulcers. Both appear to be just a little bit deeper but again we are clinging away the necrotic and dead tissue which I think is why this is progressing towards a deeper realm as opposed to improving from my measurement standpoint in that regard. Nonetheless she has been tolerating the dressing changes she absolutely hates the memory foam mattress topper that was obtained for her nonetheless I do believe this is still doing excellent as far as taking care of excess pressure in regard to the lateral malleolus regions. She in fact has no pressure injury that I see whereas in weeks past  it was week by week I was constantly seeing new pressure injuries. Overall I think it has been very beneficial for her. 07/03/18; patient arrives in my clinic today. She has deep punched out areas over her bilateral lateral malleoli. The area on the right has some more depth. We spent a lot of time today talking about pressure relief for these areas. This started when her daughter asked for a prescription for a memory foam mattress. I have never written a prescription for a mattress and I don't think insurances would pay for that on an ordinary bed. In any case he came up that she has foam boots that she refuses to wear. I would suggest going to these before any other offloading issues when she is in bed. They say she is meticulous about offloading this the rest of the day 07/10/18- She is seen in follow-up evaluation for bilateral, lateral malleolus ulcers. There is no improvement in the ulcers. She has purchased and is sleeping on a memory foam mattress/overlay, she has been using the offloading boots nightly over the past week. She has a follow up appointment with vascular medicine at the end of October, in my opinion this follow up should be expedited given her deterioration and suboptimal TBI results. We will order plain film xray of the left ankle as deeper structures are palpable; would consider having MRI, regardless of xray report([name]). The ulcers will be treated with iodoflex/iodosorb, she is unable to safely change the dressings daily with santyl. 07/19/18 on evaluation today patient appears to be doing in general visually well in regard to her bilateral lateral malleolus ulcers. She has been tolerating the dressing changes without complication which is good news. With that being said we did have an x-ray performed on 07/12/18 which revealed a slight loosen see in the lateral portion of the distal left fibula which may represent artifact but underline lytic destruction or osteomyelitis could not  be excluded. MRI was recommended. With that being said we can see about getting the patient scheduled for an MRI to further evaluate this area. In fact we have that scheduled currently for August 20 19,019. 07/26/18 on evaluation today patient'[name] wound on the right lateral ankle actually appears to be doing fairly well at this point in my pinion. She has made some good progress currently. With that being said unfortunately in regard to the left lateral ankle ulcer this  seems to be a little bit more problematic at this time. In fact as I further evaluated the situation she actually had bone exposed which is the first time that'[name] been the case in the bone appear to be necrotic. Currently I did review patient'[name] note from Dr. Bunnie Domino office with [facility]. He stated that ABI was 1.26 on the right and 0.95 on the left with good waveforms. Her perfusion is stable not reduced from previous studies and her digital waveforms were pretty good particularly on the right. His conclusion upon review of the note was that there was not much she could do to improve her perfusion and he felt she was adequate for wound healing. His suggestion was that she continued to see [place] and consider a synthetic skin graft if there was no underlying infection. He plans to see her back in six months or as needed. 08/01/18 on evaluation today patient appears to be doing better in regard to her right lateral ankle ulcer. Her left lateral ankle ulcer is about the same she still has bone involvement in evidence of necrosis. There does not appear to be evidence of infection at this time On the right lateral lower extremity. I have started her on the Augmentin she picked this up and started this yesterday. This is to get her through until she sees infectious disease which is scheduled for 08/12/18. 08/06/18 on evaluation today patient appears to be doing rather well considering my discussion with patient'[name] daughter at the  end of last week. The area which was marked where she had erythema seems to be improved and this is good news. With that being said overall the patient seems to be making good improvement when it comes to the overall appearance of the right lateral ankle ulcer although this has been slow she at least is coming around in this regard. Unfortunately in regard to the left lateral ankle ulcer this is osteomyelitis based on the pathology report as well is bone culture. Nonetheless we are still waiting CT scan. Unfortunately the MRI we originally ordered cannot be performed as the patient is a pacemaker which I had overlooked. Nonetheless we are working on the CT scan approval and scheduling as of now. She did go to the hospital over the weekend and was placed on IV Cefzo for a couple of days. Fortunately this seems to have improved the erythema quite significantly which is good news. There does not appear to be any evidence of worsening infection at this time. She did have some bleeding after the last debridement therefore I did not perform any sharp debridement in regard to left lateral ankle at this point. Patient has been approved for a snap vac for the right lateral ankle. 08/14/18; the patient with wounds over her bilateral lateral malleoli. The area on the right actually looks quite good. Been using a snap back on this area. Healthy granulation and appears to be filling in. Unfortunately the area on the left is really problematic. 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 Also noted to have cellulitis. She saw Dr. Novella Olive of infectious disease today and was put on linezolid. We are able to verify this with her pharmacy. She is completed the Augmentin that she was already on. We've been using Iodoflex to this area 08/23/18 on evaluation today patient'[name] wounds both actually appear to be doing better compared to my prior  evaluations. Fortunately  she showing signs of good improvement in regard to the overall wound status especially where were using the snap vac on the right. In regard to left lateral malleolus the wound bed actually appears to be much cleaner than previously noted. I do not feel any phone directly probed during evaluation today and though there is tendon noted this does not appear to be necrotic it'[name] actually fairly good as far as the overall appearance of the tendon is concerned. In general the wound bed actually appears to be doing significantly better than it was previous. Patient is currently in the care of [name] and I did review that note today. He actually has her on two weeks of linezolid and then following the patient will be on 1-2 months of Keflex. That is the plan currently. She has been on antibiotics therapy as prescribed by myself initially starting on July 30, 2018 and has been on that continuously up to this point. 08/30/18 on evaluation today patient actually appears to be doing much better in regard to her right lateral malleolus ulcer. She has been tolerating the dressing changes specifically the snap vac without complication although she did have some issues with the seal currently. Apparently there was some trouble with getting it to maintain over the past week past Sunday. Nonetheless overall the wound appears better in regard to the right lateral malleolus region. In regard to left lateral malleolus this actually show some signs of additional granulation although there still tendon noted in the base of the wound this appears to be healthy not necrotic in any way whatsoever. We are considering potentially using a snap vac for the left lateral malleolus as well the product wrap from [employer], [place], was present in the clinic today we're going to see this patient I did have her come in with me after obtaining consent from the patient and her daughter in order to look at the wound  and see if there'[name] any recommendation one way or another as to whether or not they felt the snapback could be beneficial for the left lateral malleolus region. But BETH, GOODLIN ([id number]) the conclusion was that it might be but that this is definitely a little bit deeper wound than what traditionally would be utilized for a snap vac. 09/06/18 on evaluation today patient actually appears to be doing excellent in my pinion in regard to both ankle ulcers. She has been tolerating the dressing changes without complication which is great news. Specifically we have been using the snap vac. In regard to the right ankle I'm not even sure that this is going to be necessary for today and following as the wound has filled in quite nicely. In regard to the left ankle I do believe that we're seeing excellent epithelialization from the edge as well as granulation in the central portion the tendon is still exposed but there'[name] no evidence of necrotic bone and in general I feel like the patient has made excellent progress even compared to last week with just one week of the snap vac. 09/11/18; this is a patient who has wounds on her bilateral lateral malleoli. Initially both of these were deep stage IV wounds in the setting of chronic arterial insufficiency. She has been revascularized. As I understand think she been using snap vacs to both of these wounds however the area on the right became more superficial and currently she is only using it on the left. Using silver collagen on the right and silver collagen under the  back on the left I believe 09/19/18 on evaluation today patient actually appears to be doing very well in regard to her lateral malleolus or ulcers bilaterally. She has been tolerating the dressing changes without complication. Fortunately there does not appear to be any evidence of infection at this time. Overall I feel like she is improving in an excellent manner and I'm very pleased with the  fact that everything seems to be turning towards the better for her. This has obviously been a long road. 09/27/18 on evaluation today patient actually appears to be doing very well in regard to her bilateral lateral malleolus ulcers. She has been tolerating the dressing changes without complication. Fortunately there does not appear to be any evidence of infection at this time which is also great news. No fevers, chills, nausea, or vomiting noted at this time. Overall I feel like she is doing excellent with the snap vac on the left malleolus. She had 40 mL of fluid collection over the past week. 10/04/18 on evaluation today patient actually appears to be doing well in regard to her bilateral lateral malleolus ulcers. She continues to tolerate the dressing changes without complication. One issue that I see is the snap vac on the left lateral malleolus which appears to have sealed off some fluid underlying this area and has not really allowed it to heal to the degree that I would like to see. For that reason I did suggest at this point we may want to pack a small piece of packing strip into this region to allow it to more effectively wick out fluid. 10/11/18 in general the patient today does not feel that she has been doing very well. She'[name] been a little bit lethargic and subsequently is having bodyaches as well according to what she tells me today. With that being said overall she has been concerned with the fact that something may be worsening although to be honest her wounds really have not been appearing poorly. She does have a new ulcer on her left heel unfortunately. This may be pressure related. Nonetheless it seems to me to have potentially started at least as a blister I do not see any evidence of deep tissue injury. In regard to the left ankle the snap vac still seems to be causing the ceiling off of the deeper part of the wound which is in turn trapping fluid. I'm not extremely pleased with  the overall appearance as far as progress from last week to this week therefore I'm gonna discontinue the snap vac at this point. 10/18/18 patient unfortunately this point has not been feeling well for the past several days. She was seen by Grayland Ormond her primary care provider who is a Librarian, academic at [facility]. Subsequently she states that she'[name] been very weak and generally feeling malaise. No fevers, chills, nausea, or vomiting noted at this time. With that being said bloodwork was performed at the PCP office on the 11th of this month which showed a white blood cell count of 10.7. This was repeated today and shows a white blood cell count of 12.4. This does show signs of worsening. Coupled with the fact that she is feeling worse and that her left ankle wound is not really showing signs of improvement I feel like this is an indication that the osteomyelitis is likely exacerbating not improving. Overall I think we may also want to check her C-reactive protein and sedimentation rate. Actually did call [facility] office this afternoon while the  patient was in the office here with me. Subsequently based on the findings we discussed treatment possibilities and I think that it is appropriate for [place] to go ahead and initiate treatment with doxycycline which I'm going to do. Subsequently he did agree to see about adding a CRP and sedimentation rate to her orders. If that has not already been drawn to where they can run it they will contact the patient she can come back to have that check. They are in agreement with plan as far as the patient and her daughter are concerned. Nonetheless also think we need to get in touch with Dr. Henreitta Leber office to see about getting the patient scheduled with him as soon as possible. 11/08/18 on evaluation today patient presents for follow-up concerning her bilateral foot and ankle ulcers. I did do an extensive review of her chart in epic today.  Subsequently she was seen by [name] he did initiate Cefepime IV antibiotic therapy. Subsequently she had some issues with her PICC line this had to be removed because it was coiled and then replaced. Fortunately that was now settled. Unfortunately she has continued have issues with her left heel as well as the issues that she is experiencing with her bilateral lateral malleolus regions. I do believe however both areas seem to be doing a little bit better on evaluation today which is good news. No fevers, chills, nausea, or vomiting noted at this time. She actually has an angiogram schedule with Dr. dew on this coming Monday, November 11, 2018. Subsequently the patient states that she is feeling much better especially than what she was roughly 2 weeks ago. She actually had to cancel an appointment because she was feeling so poorly. No fevers, chills, nausea, or vomiting noted at this time. 11/15/18 on evaluation today patient actually is status post having had her angiogram with Dr. dew Monday, four days ago. 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 He had to go and work on several areas of the vasculature fortunately he was able to obtain no more than a 30% residual stenosis throughout post procedure. I reviewed this note today. I think this will definitely help with healing at this time. Fortunately there does not appear to be any signs of infection and I do feel like ratio already has a better appearance to it. 11/22/18 upon evaluation today patient actually appears to be doing very well in regard to her wounds in general. The right lateral malleolus looks excellent the heel looks better in the left lateral malleolus also appears to be doing a little better. With that being said the right second toe actually appears to be open and training we been watching this is been dry and stable but now is open. 12/03/2018 Seen today for follow-up and management of multiple  bilateral lower extremity wounds. New pressure injury of the great toe which is closed at this time. Wound of the right distal second toe appears larger today with deep undermining and a pocket of fluid present within the undermining region. Left and right malleolus is wounds are stable today with no signs and symptoms of infection.Denies any needs or concerns during exam today. 12/13/18 on evaluation today patient appears to be doing somewhat better in regard to her left heel ulcer. She also seems to be completely healed in regard to the right lateral malleolus ulcer. The left malleolus ulcer is smaller what unfortunately the wounds which are new over the first and  second toes of the right foot are what are most concerning at this point especially the second. Both areas did require sharp debridement today. 12/20/18 on evaluation today patient'[name] wound actually appears to be doing better in regard to left lateral ankle and her right lateral ankle continues to remain healed. The hill ulcer on the left is improved. She does have improvement noted as well in regard to both toe ulcers. Overall I'm very pleased in this regard. No fevers, chills, nausea, or vomiting noted at this time. [name], SANTINO ([id number]) 12/23/18 on evaluation today patient is seen after she had her toenails trimmed at the podiatrist office due to issues with her right great toe. There was what appeared to be dark eschar on the surface of the wound which had her in the podiatrist concerned. Nonetheless as I remember that during the last office visit I had utilize silver nitrate of this area I was much less concerned about the situation. Subsequently I was able to clean off much of this tissue without any complication today. This does not appear to show any signs of infection and actually look somewhat better compared to last time post debridement. Her second toe on the right foot actually had callous over and there did appear still  be some fluid underneath this that would require debridement today. 12/27/18 on evaluation today patient actually appears to be showing signs of improvement at all locations. Even the left lateral ankle although this is not quite as great as the other sites. Fortunately there does not appear to be any signs of infection at this time and both of her toes on the right foot seem to be showing signs of improvement which is good news and very pleased in this regard. 01/03/19 on evaluation today patient appears to be doing better for the most part in regard to her wounds in particular. There does not appear to be any evidence of infection at this time which is good news. Fortunately there is no sign of really worsening anywhere except for the right great toe which she does have what appears to be a bruise/deep tissue injury which is very superficial and already resolving. I'm not sure where this came from I questioned her extensively and she does not recall what may have happened with this. Other than that the patient seems to be doing well even the left lateral ankle ulcer looks good and is getting smaller. 01/10/19 on evaluation today patient appears to be doing well in regard to her left heel wound and both of her toe wounds. Overall I feel like there is definitely improvement here and I'm happy in that regard. With that being said unfortunately she is having issues with the left lateral malleolus ulcer which unfortunately still has a lot of depth to it. This is gonna be a very difficult wound for [place] to be able to truly get to heal. I may want to consider some type of skin substitute to see if this would be of benefit for her. I'll discuss this with her more the next visit most likely. This was something I thought about more at the end of the visit when I was Artie out of the room and the patient had been discharged. 01/17/19 on evaluation today patient appears to be doing very well in regard to her wounds in  general. She'[name] been making excellent progress at this time. Fortunately there'[name] no sign of infection at this time either. No fevers, chills, nausea, or vomiting noted at  this time. The biggest issue is still her left lateral malleolus where it appears to be doing well and is getting smaller but still shows a small corner where this is deeper and goes down into what appears to be the joint space. Nonetheless this is taking much longer to heal although it still looks better in smaller than previous evaluations. 01/24/19 on evaluation today patient'[name] wounds actually appear to be doing rather well in general overall. She did require some sharp debridement in regard to the right great toe but everything else appears to be doing excellent no debridement was even necessary. No fevers, chills, nausea, or vomiting noted at this time. 01/31/19 on evaluation today patient actually appears to be doing much better in regard to her left foot wound on the heel as well as the ankle. The right great toe appears to be a little bit worse today this had callous over and trapped a lot of fluid underneath. Fortunately there'[name] no signs of infection at any site which is great news. 02/07/19 on evaluation today patient actually appears to be doing decently well in regard to all of her ulcers at this point. No sharp debridement was required she is a little bit of hyper granulation in regard to the left lateral ankle as well as the left heel but the hill itself is almost completely healed which is excellent news. Overall been very pleased in this regard. 02/14/19 on evaluation today patient actually appears to be doing very well in regard to her ulcers on the right first toe, left lateral malleolus, and left heel. In fact the heel is almost completely healed at this point. The patient does not show any signs of infection which is good news. Overall very pleased with how things have progressed. 04/18/19 Telehealth Evaluation During  the COVID-19 National Emergency: Verbal Consent: Obtained from patient Allergies: reviewed and the active list is current. Medication changes: patient has no current medication changes. COVID-19 Screening: 1. Have you traveled internationally or on a cruise ship in the last 14 dayso No 2. Have you had contact with someone with or under investigation for COVID-19o No 3. Have you had a fever, cough, sore throat, or experiencing shortness of breatho No on evaluation today actually did have a visit with this patient through a telehealth encounter with her home health nurse. Subsequently it was noted that the patient actually appears to be doing okay in regard to her wounds both the right great toe as well as the left lateral malleolus have shown signs of improvement although this in your theme around the left lateral malleolus there eschar coverings for both locations. The question is whether or not they are actually close and whether or not home health needs to discharge the patient or not. Nonetheless my concern is this point obviously is that without actually seeing her and being able to evaluate this directly I cannot ensure that she is completely healed which is the question that I'm being asked. 04/22/19 on evaluation today patient presents for her first evaluation since last time I saw her which was actually February 14, 2019. I did do a telehealth visit last week in which point it was questionable whether or not she may be healed and had to bring her in today for confirmation. With that being said she does seem to be doing quite well at this point which is good news. There does not appear to be any drainage in the deed I believe her wounds may be healed. Readmission:  09/04/2019 on evaluation today patient appears to be doing unfortunately somewhat more poorly in regard to her left foot ulcer secondary to a wound that began on 08/21/2019 at least when she first noticed this. Fortunately she has not  had any evidence of active infection at this time. Systemically. I also do not necessarily see any evidence of infection at the blister/wound site on the first metatarsal head plantar aspect. This almost appears to be something that may have just rubbed inappropriately causing this to breakdown. They did not want a wait too long to come in to be seen as again she had significant issues in the past with wounds that took quite a while to heal in fact it was close to 2 years. Nonetheless this does not appear to be quite that bad but again we do need to remove some of the necrotic tissue from the surface of the wound to tell exactly the extent. She does not appear to have any significant arterial disease at this point and again her last ABIs and TBI'[name] are recorded above in the alert section her left ABI was 1.27 with a TBI of 0.72 to the right ABI 1.08 with a TBI of 0.39. Other than this the patient has been doing quite ZAILYN, THOENNES ([id number]) well since I last saw her and that was in May 2020. 09/11/2019 on evaluation today patient appeared to be doing very well with regard to her plantar foot ulcer on the left. In fact this appears to be almost completely healed which is awesome. That is after just 1 week of intervention. With that being said there is no signs of active infection at this time. 09/18/2019 on evaluation today patient actually appears to be doing excellent in fact she is completely healed based on what I am seeing at this point. Fortunately there is no signs of active infection at this time and overall patient is very pleased to hear that this area has healed so quickly. Readmission: 05/13/2021 upon evaluation today this patient presents for reevaluation here in the clinic. This is a wound that actually we previously took care of. She had 1 on the right ankle and the left the left turned out to be be harder due to to heal but nonetheless is doing great at this point as the  right that has reopened and it was noted first just several weeks ago with a scab over it and came off in just the past few days. Fortunately there does not appear to be any obvious evidence of significant active infection at this time which is great news. No fevers, chills, nausea, vomiting, or diarrhea. 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 There does not appear to be any signs of this interfering in any way with her wound. She does have swelling we previously had compression socks for her ordered but again it does not look like she wears these on a regular basis by any means. 05/26/2021 upon evaluation today patient appears to be doing well with regard to her wound which is actually showing signs of excellent improvement. There does not appear to be any signs of active infection which is great news and overall very pleased with where things stand today. No fevers, chills, nausea, vomiting, or diarrhea. 06/02/2021 upon evaluation today patient'[name] wound actually showing signs of excellent improvement. Fortunately there does not appear to be any signs of active infection which is great news. I think the patient  is making good progress with regard to her wounds in general. 06/09/2021 upon evaluation today patient appears to be doing excellent in regard to her wounds currently. Fortunately there does not appear to be any signs of active infection which is great news. No fevers, chills, nausea, vomiting, or diarrhea. Overall extremely pleased with where things stand today. I think the patient is making excellent progress. 06/16/2021 upon evaluation today patient appears to be doing well in regard to her wound. This is going require little bit of debridement today and that was discussed with the patient. Otherwise she seems to be doing quite well and I am actually very pleased with where things stand at this point. No fevers, chills, nausea, vomiting, or  diarrhea. 06/23/2021 upon evaluation today patient appears to be doing well with regard to her wounds. She has been tolerating the dressing changes without complication. Fortunately there does not appear to be any evidence of infection and she has not had air in her home which she actually lives at an assisted living that got fixed this morning. With that being said because of that her wrap has been extremely hot and bothersome for her over the past week. 06/30/2021 upon evaluation today patient is actually making excellent progress in regard to her ankle ulcer. She has been tolerating the dressing changes without complication and overall extremely pleased with where things stand there does not appear to be any evidence of active infection which is great news. No fevers, chills, nausea, vomiting, or diarrhea. 07/07/21 upon evaluation today patients and culture on the right actually appears to be doing quite well. There does not appear to be any signs of infection and overall very pleased with where things stand today. No fevers, chills, nausea, or vomiting noted at this time. 07/14/2021 unfortunately the patient today has some evidence of deep tissue injury and pressure getting to the ankle region. Again I am not exactly sure what is going on here but this is very similar to issues that we have had in the past. I explained to the patient that she needs to be very mindful of exactly what is happening I think sleeping in bed is probably the main issue here although there could be other culprits I am not sure what else would potentially lead to this kind of a problem for her. 07/21/2021 upon evaluation today patient'[name] wound actually showing signs of improvement compared to last week. Fortunately there does not appear to be any signs of active infection which is great news and overall very pleased with where things stand in that regard. With that being said I do believe that she is continuing to show signs of  overall of getting better although I think this is still basically about what we were 2 weeks ago due to the worsening and now improvement. 07/28/2021 upon evaluation today patient appears to be doing well with regard to her wound. She does have some slough buildup on the surface of the wound which I would have to manage today. Fortunately there is no sign of active infection at this time. No fevers, chills, nausea, vomiting, or diarrhea. 08/04/2021 upon evaluation today patient appears to be doing about the same in regard to her wound. To be perfectly honest I am beginning to be a little bit concerned about the overall appearance of the wound bed. I do think possibly taking a sample right around the margin of the wound could be beneficial for her as far as identifying anything such as an  inflammatory process or to be honest even a skin tag cancer type process that may be of concern here. Fortunately there does not appear to be any evidence of active infection at this time which is great news she is not having any pain also great news. 08/11/2021 upon evaluation today patient appears to be doing well with regard to her wound. The good news is I did review her biopsy results and it showed some inflammatory mixed findings but nothing that appeared to be malignant which is great news. Overall this is more of a chronic venous stasis type issue which again is more what we have been treating. Nonetheless I just wanted to make sure before going forward that there was not anything more untoward going on at this point. 08/18/2021 upon evaluation today patient appears to be doing well with regard to her ankle ulcer. Fortunately there does not appear to be any signs of active infection at this time which is great overall wound is dramatically improved compared to last week. Since last week I have actually placed her on doxycycline and subsequently this is a good option as far as the findings are concerned at this  point. 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 the doxycycline is doing that. There does not appear to be any evidence of active infection systemically which is great news. 08/25/2021 upon evaluation today patient appears to be doing well with regard to her wound. I feel like we are finally get back on track as far as healing is concerned I am much happier with the overall appearance today. I do think that she is tolerating the dressing changes without complication which is great news. We have been using Hydrofera Blue which I think is a good option. The good news is she is also doing great in [place], DONALYN SCHNEEBERGER. ([id number]) regard to her compression sock on the left which is a zipper compression that seems to be doing a great job keeping her edema under good control. 09/01/2021 upon evaluation today patient appears to be doing well with regard to her wound. Infection seems to be under much better control which is great news and very pleased in that regard. Fortunately there does not appear to be any signs of infection currently. 09/08/2021 upon evaluation today patient actually appears to be making good progress in regard to her wound. She has been tolerating the dressing changes without complication. Fortunately there does not appear to be any evidence of active infection at this time which is great news as well. No fevers, chills, nausea, vomiting, or diarrhea. 09/22/2021 upon evaluation today patient appears to be doing well with regard to her wound although is very slow to heal. We have not looked into Apligraf yet I think that is something that we should see about doing. 09/29/2021 upon evaluation today patient'[name] wound is actually showing signs of doing about the same. I am not seeing any evidence of worsening but also no significant evidence of improvement. We did  gain approval for the organogenesis products all except for Apligraf as covered by her insurance. With that being said I do think that we can go ahead and proceed with the NuShield if the patient and her family in agreement of the plan I discussed that with him today she does have a 20% coinsurance which we also discussed. 10/06/2021 upon evaluation today  patient appears to unfortunately be doing a little bit worse she appears to be infected based on what I am seeing. Fortunately there does not appear to be any signs of active infection at this time which is great news. Unfortunately it does appear to be some evidence of around the wound edge indicated by way of erythema and warmth as well as redness 10/13/2021 upon evaluation today patient actually appears to be doing excellent in regard to her ankle ulcer compared to what it was. 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 I think were fine to go ahead and apply the NuShield today. 10/20/2021 upon evaluation today patient appears to be doing excellent in fact the NuShield seems to have done all some for her thus far. I am actually very pleased with where we stand and overall I think that she is making great progress. There is no evidence of active infection at this time. 11/23; patient presents for follow-up. She has no issues or complaints today. She denies signs of infection. She reports stability in her wound healing. 11/03/2021 upon evaluation today patient appears to be doing well with regard to her wounds. She has been tolerating the dressing changes without complication. Fortunately there is no signs of active infection at this time. 11/10/2021 upon evaluation today patient appears to be doing well with regard to her right ankle which is actually  showing signs of improvement with the NuShield I am very pleased. Subsequently in regards to the left ankle this appears to be doing okay with no evidence of issue here either. With that being said she has a significant contusion on the left leg from having fallen 2 days ago. She tells me that she was using her walker going to the closet and then when she got to the closet turned around to get something out at which point she fell according to the story. Nonetheless she does have a hematoma just below her knee on the anterior portion of her shin. My hope is that this will not open until wound although we do need I think Some compression over it also think that we need to have her use an ice as well to help with the swelling and prevent this from getting worse. The last thing she needs is a wound opened up here. 11/17/2021 upon evaluation patient'[name] right ankle actually showing signs of improvement based on what I see currently there is a lot of new skin growth coming in which is great news. Nonetheless I do feel like that the patient is showing improvement as well in regard to her left leg with the use of the Tubigrip it swollen but not as bruised as what I would have expected after what I was seeing last week. Nonetheless I do think doubling up on the Tubigrip would probably be beneficial to try to keep some of the edema under better control here. 11/24/2021 upon evaluation today patient appears to be doing excellent in regard to her wound. This is actually measuring significantly smaller which is great news. Fortunately I do not see any signs of active infection locally nor systemically at this point. Overall I am very pleased with how the NuShield is doing. 12/30; wound bed looks healthy however not much change in overall wound volume. We applied Nushield again today  in the standard fashion 12/08/2021 upon evaluation today patient'[name] wound actually showing signs of good improvement and I am actually  very pleased with where we stand today as well. I do not see any evidence of active infection locally nor systemically which is great news. Unfortunately she has been having some issues with stomach upset and diarrhea today. 12/15/2021 upon evaluation today patient appears to be doing excellent in regard to her wound. There is a little bit of dry skin raised up around the edges of the wound but fortunately nothing too significant at this point. Fortunately I do not see any evidence of active infection either which is great news. No fevers, chills, nausea, vomiting, or diarrhea. 12/29/2021 upon evaluation today patient appears to be doing well with regard to her wound. In general I feel like she is getting very close to complete resolution. I think that she is making good progress here as well. Overall I do not see any signs of infection and very little of this appears to actually be open. 01/12/2022 upon evaluation today patient appears to be doing a little bit worse in regard to her wound. It appears that the collagen actually trapped fluid underneath the collagen which got hard and subsequently caused the fluid to back up. This patient has made the wound appear to be larger than what it was previous. With that being said I do not see any signs of obvious infection is not warm to touch and not significantly erythematous either which is good news. Nonetheless I do think that we will need to continue to keep an eye on this. Probably not go put on antibiotic this week but I will be too far from doing so she is actually on a Z-Pak right now I do not want to really double up on antibiotics. 01/26/2022 upon evaluation today patient'[name] wound is showing signs of improvement although this is slow to turn back around. Fortunately there does not appear to be any evidence of active infection locally or systemically which is great news and overall I am extremely pleased with where we stand today. No fevers, chills,  nausea, vomiting, or diarrhea. CAMEREN, ODWYER ([id number]) Objective Constitutional Well-nourished and well-hydrated in no acute distress. Vitals Time Taken: 10:18 AM, Height: 62 in, Weight: 150 lbs, BMI: 27.4, Temperature: 98.8 F, Pulse: 76 bpm, Respiratory Rate: 18 breaths/min, Blood Pressure: 134/70 mmHg. Respiratory normal breathing without difficulty. Psychiatric this patient is able to make decisions and demonstrates good insight into disease process. Alert and Oriented x 3. pleasant and cooperative. General Notes: Patient'[name] wound bed did require some sharp debridement to clear away some of the slough and biofilm from the surface of the wound. She tolerated this debridement today with minimal discomfort postdebridement this appears to be doing much better in general I feel like we are headed in the right direction. Integumentary (Hair, Skin) Wound #7 status is Open. Original cause of wound was Gradually Appeared. The date acquired was: 05/12/2021. The wound has been in treatment 36 weeks. The wound is located on the Right,Lateral Malleolus. The wound measures 0.8cm length x 1.1cm width x 0.2cm depth; 0.691cm^2 area and 0.138cm^3 volume. There is Fat Layer (Subcutaneous Tissue) exposed. There is no tunneling or undermining noted. There is a medium amount of serosanguineous drainage noted. There is medium (34-66%) red granulation within the wound bed. There is a medium (34-66%) amount of necrotic tissue within the wound bed including Adherent Slough. Assessment Active Problems ICD-10 Pressure ulcer of right ankle, stage  3 Lymphedema, not elsewhere classified Venous insufficiency (chronic) (peripheral) Presence of cardiac pacemaker Long term (current) use of anticoagulants Procedures Wound #7 Pre-procedure diagnosis of Wound #7 is a Pressure Ulcer located on the Right,Lateral Malleolus . There was a Excisional Skin/Subcutaneous Tissue Debridement with a total area of 0.88 sq cm  performed by Tommie Sams., PA-C. With the following instrument([name]): Curette to remove Viable and Non-Viable tissue/material. Material removed includes Subcutaneous Tissue, Slough, Skin: Dermis, and Skin: Epidermis. No specimens were taken. A time out was conducted at 10:40, prior to the start of the procedure. A Minimum amount of bleeding was controlled with Pressure. The procedure was tolerated well with a pain level of 0 throughout and a pain level of 0 following the procedure. Post Debridement Measurements: 98.8cm length x 76cm width x 18cm depth; 106153.2cm^3 volume. Post debridement Stage noted as Category/Stage III. Character of Wound/Ulcer Post Debridement is improved. Post procedure Diagnosis Wound #7: Same as Pre-Procedure Plan Follow-up Appointments: Return Appointment in 1 week. Bathing/ Shower/ Hygiene: ELEINA, JERGENS ([id number]) May shower with wound dressing protected with water repellent cover or cast protector. Edema Control - Lymphedema / Segmental Compressive Device / Other: Optional: One layer of unna paste to top of compression wrap (to act as an anchor). - and at toes Patient to wear own compression stockings. Remove compression stockings every night before going to bed and put on every morning when getting up. - left leg Elevate, Exercise Daily and Avoid Standing for Long Periods of Time. Elevate legs to the level of the heart and pump ankles as often as possible Elevate leg([name]) parallel to the floor when sitting. WOUND #7: - Malleolus Wound Laterality: Right, Lateral Cleanser: Soap and Water 1 x Per Week/30 Days Discharge Instructions: Gently cleanse wound with antibacterial soap, rinse and pat dry prior to dressing wounds Primary Dressing: Algicell Calcium Alginate Dressing 2x2 (in/in) 1 x Per Week/30 Days Primary Dressing: Medihoney - Gel, 1.5 (oz), tube 1 x Per Week/30 Days Secondary Dressing: Gauze 1 x Per Week/30 Days Discharge Instructions: As directed:  dry, moistened with saline or moistened with Dakins Solution Compression Wrap: Profore Lite LF 3 Multilayer Compression Bandaging System 1 x Per Week/30 Days Discharge Instructions: Apply 3 multi-layer wrap as prescribed. 1. I would recommend that we going to continue with the wound care measures as before and the patient is in agreement with plan this includes the use of the alginate dressing along with Medihoney which I think is doing a good job. 2. Also can recommend that we continue with the 3 layer compression wrap which I think is doing well for her. 3. I would also suggest the patient continue to monitor for any signs of worsening or infection if anything changes she should let me know soon as possible. We will see patient back for reevaluation in 1 week here in the clinic. If anything worsens or changes patient will contact our office for additional recommendations. Electronic Signature([name]) Signed: 01/26/2022 10:54:12 AM By: Worthy Keeler PA-C Entered By: Worthy Keeler on 01/26/2022 10:54:11 Alaniz, [name] ([id number]) -------------------------------------------------------------------------------- SuperBill Details Patient Name: [name] Date of Service: 01/26/2022 Medical Record Number: [id number] Patient Account Number: [id number] Date of Birth/Sex: 1923-11-29 (88 y.o. F) Treating RN: Carlene Coria Primary Care Provider: Adrian Prows Other Clinician: Referring Provider: Adrian Prows Treating Provider/Extender: Skipper Cliche in Treatment: 36 Diagnosis Coding ICD-10 Codes Code Description [phone] Pressure ulcer of right ankle, stage 3 I89.0 Lymphedema, not elsewhere classified I87.2 Venous insufficiency (  chronic) (peripheral) Z95.0 Presence of cardiac pacemaker Z79.01 Long term (current) use of anticoagulants Facility Procedures CPT4 Code: 32440102 Description: 72536 - DEB SUBQ TISSUE 20 SQ CM/< Modifier: Quantity: 1 CPT4 Code: Description:  ICD-10 Diagnosis Description 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, stage 3 Modifier: Quantity: Physician Procedures CPT4 Code: 6440347 Description: 42595 - WC PHYS SUBQ TISS 20 SQ CM Modifier: Quantity: 1 CPT4 Code: Description: ICD-10 Diagnosis Description 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, stage 3 Modifier: Quantity: Electronic Signature([name]) Signed: 01/26/2022 10:54:21 AM By: Worthy Keeler PA-C Entered By: Worthy Keeler on 01/26/2022 10:54:21

## 2022-02-02 ENCOUNTER — Other Ambulatory Visit: Payer: Self-pay

## 2022-02-02 ENCOUNTER — Encounter: Payer: Medicare Other | Attending: Physician Assistant | Admitting: Physician Assistant

## 2022-02-02 DIAGNOSIS — I890 Lymphedema, not elsewhere classified: Secondary | ICD-10-CM | POA: Insufficient documentation

## 2022-02-02 DIAGNOSIS — Z950 Presence of cardiac pacemaker: Secondary | ICD-10-CM | POA: Insufficient documentation

## 2022-02-02 DIAGNOSIS — I10 Essential (primary) hypertension: Secondary | ICD-10-CM | POA: Diagnosis not present

## 2022-02-02 DIAGNOSIS — G629 Polyneuropathy, unspecified: Secondary | ICD-10-CM | POA: Insufficient documentation

## 2022-02-02 DIAGNOSIS — M1990 Unspecified osteoarthritis, unspecified site: Secondary | ICD-10-CM | POA: Diagnosis not present

## 2022-02-02 DIAGNOSIS — L89513 Pressure ulcer of right ankle, stage 3: Secondary | ICD-10-CM | POA: Diagnosis present

## 2022-02-02 DIAGNOSIS — Z7901 Long term (current) use of anticoagulants: Secondary | ICD-10-CM | POA: Insufficient documentation

## 2022-02-02 DIAGNOSIS — I739 Peripheral vascular disease, unspecified: Secondary | ICD-10-CM | POA: Diagnosis not present

## 2022-02-02 DIAGNOSIS — Z85828 Personal history of other malignant neoplasm of skin: Secondary | ICD-10-CM | POA: Diagnosis not present

## 2022-02-02 DIAGNOSIS — I872 Venous insufficiency (chronic) (peripheral): Secondary | ICD-10-CM | POA: Insufficient documentation

## 2022-02-02 DIAGNOSIS — Z95820 Peripheral vascular angioplasty status with implants and grafts: Secondary | ICD-10-CM | POA: Diagnosis not present

## 2022-02-02 NOTE — Progress Notes (Addendum)
[name], [name] ([id number]) Visit Report for 02/02/2022 Chief Complaint Document Details Patient Name: [name], [name]. Date of Service: 02/02/2022 10:15 AM Medical Record Number: [id number] Patient Account Number: [id number] Date of Birth/Sex: 1923-03-11 (88 y.o. F) Treating RN: Carlene Coria Primary Care Provider: Adrian Prows Other Clinician: Referring Provider: Adrian Prows Treating Provider/Extender: Skipper Cliche in Treatment: 93 Information Obtained from: Patient Chief Complaint Right foot ulcer Electronic Signature(s) Signed: 02/02/2022 10:39:56 AM By: Worthy Keeler PA-C Entered By: Worthy Keeler on 02/02/2022 10:39:56 Domeier, [name] ([id number]) -------------------------------------------------------------------------------- Debridement Details Patient Name: [name] Date of Service: 02/02/2022 10:15 AM Medical Record Number: [id number] Patient Account Number: [id number] Date of Birth/Sex: 08-24-1923 (88 y.o. F) Treating RN: Carlene Coria Primary Care Provider: Adrian Prows Other Clinician: Referring Provider: Adrian Prows Treating Provider/Extender: Skipper Cliche in Treatment: 37 Debridement Performed for Wound #7 Right,Lateral Malleolus Assessment: Performed By: Physician Tommie Sams., PA-C Debridement Type: Debridement Level of Consciousness (Pre- Awake and Alert procedure): Pre-procedure Verification/Time Out Yes - 10:40 Taken: Start Time: 10:40 Pain Control: Lidocaine 4% Topical Solution Total Area Debrided (L x W): 0.9 (cm) x 1.1 (cm) = 0.99 (cm) Tissue and other material Viable, Non-Viable, Callus, Slough, Subcutaneous, Skin: Dermis , Skin: Epidermis, Biofilm, Slough debrided: Level: Skin/Subcutaneous Tissue Debridement Description: Excisional Instrument: Curette Bleeding: Minimum Hemostasis Achieved: Pressure End Time: 10:46 Procedural Pain: 0 Post Procedural Pain: 0 Response to Treatment: Procedure was  tolerated well Level of Consciousness (Post- Awake and Alert procedure): Post Debridement Measurements of Total Wound Length: (cm) 0.9 Stage: Category/Stage III Width: (cm) 1.1 Depth: (cm) 0.2 Volume: (cm) 0.156 Character of Wound/Ulcer Post Debridement: Improved Post Procedure Diagnosis Same as Pre-procedure Electronic Signature(s) Signed: 02/02/2022 5:22:07 PM By: Worthy Keeler PA-C Signed: 02/03/2022 1:46:31 PM By: Carlene Coria RN Entered By: Carlene Coria on 02/02/2022 10:44:31 Geiman, [name] ([id number]) -------------------------------------------------------------------------------- HPI Details Patient Name: [name] Date of Service: 02/02/2022 10:15 AM Medical Record Number: [id number] Patient Account Number: [id number] Date of Birth/Sex: 06-27-1923 (88 y.o. 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 Dr. Amalia Hailey the podiatrist saw her on 07/20/2017 and sent her to the wound center for possible hyperbaric oxygen therapy. past medical history of peripheral vascular disease, varicose veins, status post appendectomy, basal cell carcinoma excision from the left leg, cholecystectomy, pacemaker placement, right lower extremity angiography done by Dr. dew in March 2017 with placement of a stent. there is also note of a successful ablation of the right small saphenous vein done which was reviewed by ultrasound on 10/24/2016. the patient had a right small saphenous vein ablation done on 10/20/2016. The patient has never been a smoker. She has been seen by Dr. Corene Cornea dew the vascular surgeon who  most recently saw her on 06/15/2017 for evaluation of ongoing problems with right leg swelling. She had a lower extremity arterial duplex examination done(02/13/17) which showed patent distal right superficial femoral artery stent and above-the-knee popliteal stent without evidence of restenosis. The ABI was more than 1.3 on the right and more than 1.3 on the left. This was consistent with noncompressible arteries due to medial calcification. The right great toe pressure and PPG waveforms are within normal limits and  the left great toe pressure and PPG waveforms are decreased. he recommended she continue to wear her compression stockings and continue with elevation. She is scheduled to have a noninvasive arterial study in the near future 08/16/2017 -- had a lower extremity arterial duplex examination done which showed patent distal right superficial femoral artery stent and above-the-knee popliteal stent without evidence of restenosis. The ABI was more than 1.3 on the right and more than 1.3 on the left. This was consistent with noncompressible arteries due to medial calcification. The right great toe pressure and PPG waveforms are within normal limits and the left great toe pressure and PPG waveforms are decreased. the x-ray of the right ankle has not yet been done 08/24/2017 -- had a right ankle x-ray -- IMPRESSION:1. No fracture, bone lesion or evidence of osteomyelitis. 2. Lateral soft tissue swelling with a soft tissue ulcer. she has not yet seen the vascular surgeon for review 08/31/17 on evaluation today patient's wound appears to be showing signs of improvement. She still with her appointment with vascular in order to review her results of her vascular study and then determine if any intervention would be recommended at that time. No fevers, chills, nausea, or vomiting noted at this time. She has been tolerating the dressing changes without complication. 09/28/17 on evaluation today patient's  wound appears to show signs of good improvement in regard to the granulation tissue which is surfacing. There is still a layer of slough covering the wound and the posterior portion is still significantly deeper than the anterior nonetheless there has been some good sign of things moving towards the better. She is going to go back to Dr. dew for reevaluation to ensure her blood flow is still appropriate. That will be before her next evaluation with [place] next week. No fevers, chills, nausea, or vomiting noted at this time. Patient does have some discomfort rated to be a 3-4/10 depending on activity specifically cleansing the wound makes it worse. 10/05/2017 -- the patient was seen by Dr. Lucky Cowboy last week and noninvasive studies showed a normal right ABI with brisk triphasic waveforms consistent with no arterial insufficiency including normal digital pressures. The duplex showed a patent distal right SFA stent and the proximal SFA was also normal. He was pleased with her test and thought she should have enough of perfusion for normal wound healing. He would see her back in 6 months time. 12/21/17 on evaluation today patient appears to be doing fairly well in regard to her right lateral ankle wound. Unfortunately the main issue that she is expansion at this point is that she is having some issues with what appears to be some cellulitis in the right anterior shin. She has also been noting a little bit of uncomfortable feeling especially last night and her ankle area. I'm afraid that she made the developing a little bit of an infection. With that being said I think it is in the early stages. 12/28/17 on evaluation today patient's ankle appears to be doing excellent. She's making good progress at this point the cellulitis seems to have improved after last week's evaluation. Overall she is having no significant discomfort which is excellent news. She does have an appointment with Dr. dew on March 29, 2018 for  reevaluation in regard to the stent he placed. She seems to have excellent blood flow in the right lower extremity. 01/19/12 on evaluation today patient's wound appears to be doing very well. In fact she does not appear to require debridement at  this point, there's no evidence of infection, and overall from the standpoint of the wound she seems to be doing very well. With that being said I believe that it may be time to switch to different dressing away from the [facility] Dressing she tells me she does have a lot going on her friend actually passed away yesterday and she's also having a lot of issues with her husband this obviously is weighing heavy on her as far as your thoughts and concerns today. 01/25/18 on evaluation today patient appears to be doing fairly well in regard to her right lateral malleolus. She has been tolerating the dressing changes without complication. Overall I feel like this is definitely showing signs of improvement as far as how the overall appearance of the wound is there's also evidence of epithelium start to migrate over the granulation tissue. In general I think that she is progressing nicely as far as the wound is concerned. The only concern she really has is whether or not we can switch to every other week visits in order to avoid having as many appointments as her daughters have a difficult time getting her to her appointments as well as the patient's husband to his he is not doing very well at this point. 02/22/18 on evaluation today patient's right lateral malleolus ulcer appears to be doing great. She has been tolerating the dressing changes without complication. Overall you making excellent progress at this time. Patient is having no significant discomfort. VALENE, VILLA ([id number]) 03/15/18 on evaluation today patient appears to be doing much more poorly in regard to her right lateral ankle ulcer at this point. Unfortunately since have last seen her her  husband has passed just a few days ago is obviously weighed heavily on her her daughter also had surgery well she is with her today as usual. There does not appear to be any evidence of infection she does seem to have significant contusion/deep tissue injury to the right lateral malleolus which was not noted previous when I saw her last. It's hard to tell of exactly when this injury occurred although during the time she was spending the night in the hospital this may have been most likely. 03/22/18 on evaluation today patient appears to actually be doing very well in regard to her ulcer. She did unfortunately have a setback which was noted last week however the good news is we seem to be getting back on track and in fact the wound in the core did still have some necrotic tissue which will be addressed at this point today but in general I'm seeing signs that things are on the up and up. She is glad to hear this obviously she's been somewhat concerned that due to the how her wound digressed more recently. 03/29/18 on evaluation today patient appears to be doing fairly well in regard to her right lower extremity lateral malleolus ulcer. She unfortunately does have a new area of pressure injury over the inferior portion where the wound has opened up a little bit larger secondary to the pressure she seems to be getting. She does tell me sometimes when she sleeps at night that it actually hurts and does seem to be pushing on the area little bit more unfortunately. There does not appear to be any evidence of infection which is good news. She has been tolerating the dressing changes without complication. She also did have some bruising in the left second and third toes due to the fact that she  may have bump this or injured it although she has neuropathy so she does not feel she did move recently that may have been where this came from. Nonetheless there does not appear to be any evidence of infection at this  time. 04/12/18 on evaluation today patient's wound on the right lateral ankle actually appears to be doing a little bit better with a lot of necrotic docking tissue centrally loosening up in clearing away. However she does have the beginnings of a deep tissue injury on the left lateral malleolus likely due to the fact we've been trying offload the right as much as we have. I think she may benefit from an assistive soft device to help with offloading and it looks like they're looking at one of the doughnut conditions that wraps around the lower leg to offload which I think will definitely do a good job. With that being said I think we definitely need to address this issue on the left before it becomes a wound. Patient is not having significant pain. 04/19/18 on evaluation today patient appears to be doing excellent in regard to the progress she's made with her right lateral ankle ulcer. The left ankle region which did show evidence of a deep tissue injury seems to be resolving there's little fluid noted underneath and a blister there's nothing open at this point in time overall I feel like this is progressing nicely which is good news. She does not seem to be having significant discomfort at this point which is also good news. 04/25/18-She is here in follow up evaluation for bilateral lateral malleolar ulcers. The right lateral malleolus ulcer with pale subcutaneous tissue exposure, central area of ulcer with tendon/periosteum exposed. The left lateral malleolus ulcer now with central area of nonviable tissue, otherwise deep tissue injury. She is wearing compression wraps to the left lower extremity, she will place the right lower extremity compression wraps on when she gets home. She will be out of town over the weekend and return next week and follow-up appointment. She completed her doxycycline this morning 05/03/18 on evaluation today patient appears to be doing very well in regard to her right  lateral ankle ulcer in general. At least she's showing some signs of improvement in this regard. Unfortunately she has some additional injury to the left lateral malleolus region which appears to be new likely even over the past several days. Again this determination is based on the overall appearance. With that being said the patient is obviously frustrated about this currently. 05/10/18-She is here in follow-up evaluation for bilateral lateral malleolar ulcers. She states she has purchased offloading shoes/boots and they will arrive tomorrow. She was asked to bring them in the office at next week's appointment so her provider is aware of product being utilized. She continues to sleep on right or left side, she has been encouraged to sleep on her back. The right lateral malleolus ulcer is precariously close to peri-osteum; will order xray. The left lateral malleolus ulcer is improved. Will switch back to santyl; she will follow up next week. 05/17/18 on evaluation today patient actually appears to be doing very well in regard to her malleolus her ulcers compared to last time I saw them. She does not seem to have as much in the way of contusion at this point which is great news. With that being said she does continue to have discomfort and I do believe that she is still continuing to benefit from the offloading/pressure reducing boots that were  recommended. I think this is the key to trying to get this to heal up completely. 05/24/18 on evaluation today patient actually appears to be doing worse at this point in time unfortunately compared to her last week's evaluation. She is having really no increased pain which is good news unfortunately she does have more maceration in your theme and noted surrounding the right lateral ankle the left lateral ankle is not really is erythematous I do not see signs of the overt cellulitis on that side. Unfortunately the wounds do not seem to have shown any signs of  improvement since the last evaluation. She also has significant swelling especially on the right compared to previous some of this may be due to infection however also think that she may be served better while she has these wounds by compression wrapping versus continuing to use the Juxta-Lite for the time being. Especially with the amount of drainage that she is experiencing at this point. No fevers, chills, nausea, or vomiting noted at this time. 05/31/18 on evaluation today patient appears to actually be doing better in regard to her right lateral lower extremity ulcer specifically on the malleolus region. She has been tolerating the antibiotic without complication. With that being said she still continues to have issues but a little bit of redness although nothing like she what she was experiencing previous. She still continues to pressure to her ankle area she did get the problem on offloading boots unfortunately she will not wear them she states there too uncomfortable and she can't get in and out of the bed. Nonetheless at this point her wounds seem to be continually getting worse which is not what we want I'm getting somewhat concerned about her progress and how things are going to proceed if we do not intervene in some way shape or form. I therefore had a very lengthy conversation today about offloading yet again and even made a specific suggestion for switching her to a memory foam mattress and even gave the information for a specific one that they could look at getting if it was something that they were interested in considering. She does not want to be considered for a hospital bed air mattress although honestly insurance would not cover it that she does not have any wounds on her trunk. 06/14/18 on evaluation today both wounds over the bilateral lateral malleolus her ulcers appear to be doing better there's no evidence of pressure injury at this point. She did get the foam mattress for her  bed and this does seem to have been extremely beneficial for her in my pinion. Her daughter states that she is having difficulty getting out of bed because of how soft it is. The patient also relates this to be. Nonetheless I do feel like she's actually doing better. Unfortunately right after and around the time she was getting the mattress she also sustained a fall when she got up to go pick up the phone and ended up injuring her right elbow she has 18 sut[address] in place. We are not caring for this currently although home health is going to be taking the sutures out shortly. Nonetheless this may be something that we need to evaluate going forward. It depends on how well it has or has not healed in the end. She also recently saw an orthopedic specialist for an injection in the right shoulder just before her fall unfortunately the fall seems to have worsened her pain. 06/21/18 on evaluation today patient appears to be  doing about the same in regard to her lateral malleolus ulcers. Both appear to be just a little bit deeper but again we are clinging away the necrotic and dead tissue which I think is why this is progressing towards a deeper realm as opposed AFSANA, LIERA. ([id number]) to improving from my measurement standpoint in that regard. Nonetheless she has been tolerating the dressing changes she absolutely hates the memory foam mattress topper that was obtained for her nonetheless I do believe this is still doing excellent as far as taking care of excess pressure in regard to the lateral malleolus regions. She in fact has no pressure injury that I see whereas in weeks past it was week by week I was constantly seeing new pressure injuries. Overall I think it has been very beneficial for her. 07/03/18; patient arrives in my clinic today. She has deep punched out areas over her bilateral lateral malleoli. The area on the right has some more depth. We spent a lot of time today talking about pressure  relief for these areas. This started when her daughter asked for a prescription for a memory foam mattress. I have never written a prescription for a mattress and I don't think insurances would pay for that on an ordinary bed. In any case he came up that she has foam boots that she refuses to wear. I would suggest going to these before any other offloading issues when she is in bed. They say she is meticulous about offloading this the rest of the day 07/10/18- She is seen in follow-up evaluation for bilateral, lateral malleolus ulcers. There is no improvement in the ulcers. She has purchased and is sleeping on a memory foam mattress/overlay, she has been using the offloading boots nightly over the past week. She has a follow up appointment with vascular medicine at the end of October, in my opinion this follow up should be expedited given her deterioration and suboptimal TBI results. We will order plain film xray of the left ankle as deeper structures are palpable; would consider having MRI, regardless of xray report(s). The ulcers will be treated with iodoflex/iodosorb, she is unable to safely change the dressings daily with santyl. 07/19/18 on evaluation today patient appears to be doing in general visually well in regard to her bilateral lateral malleolus ulcers. She has been tolerating the dressing changes without complication which is good news. With that being said we did have an x-ray performed on 07/12/18 which revealed a slight loosen see in the lateral portion of the distal left fibula which may represent artifact but underline lytic destruction or osteomyelitis could not be excluded. MRI was recommended. With that being said we can see about getting the patient scheduled for an MRI to further evaluate this area. In fact we have that scheduled currently for August 20 19,019. 07/26/18 on evaluation today patient's wound on the right lateral ankle actually appears to be doing fairly well at this  point in my pinion. She has made some good progress currently. With that being said unfortunately in regard to the left lateral ankle ulcer this seems to be a little bit more problematic at this time. In fact as I further evaluated the situation she actually had bone exposed which is the first time that's been the case in the bone appear to be necrotic. Currently I did review patient's note from Dr. Bunnie Domino office with  [facility] V[facility] surgery. He stated that ABI was 1.26 on the right and 0.95 on the left  with good waveforms. Her perfusion is stable not reduced from previous studies and her digital waveforms were pretty good particularly on the right. His conclusion upon review of the note was that there was not much she could do to improve her perfusion and he felt she was adequate for wound healing. His suggestion was that she continued to see [place] and consider a synthetic skin graft if there was no underlying infection. He plans to see her back in six months or as needed. 08/01/18 on evaluation today patient appears to be doing better in regard to her right lateral ankle ulcer. Her left lateral ankle ulcer is about the same she still has bone involvement in evidence of necrosis. There does not appear to be evidence of infection at this time On the right lateral lower extremity. I have started her on the Augmentin she picked this up and started this yesterday. This is to get her through until she sees infectious disease which is scheduled for 08/12/18. 08/06/18 on evaluation today patient appears to be doing rather well considering my discussion with patient's daughter at the end of last week. The area which was marked where she had erythema seems to be improved and this is good news. With that being said overall the patient seems to be making good improvement when it comes to the overall appearance of the right lateral ankle ulcer although this has been slow she at least is coming around in this  regard. Unfortunately in regard to the left lateral ankle ulcer this is osteomyelitis based on the pathology report as well is bone culture. Nonetheless we are still waiting CT scan. Unfortunately the MRI we originally ordered cannot be performed as the patient is a pacemaker which I had overlooked. Nonetheless we are working on the CT scan approval and scheduling as of now. She did go to the hospital over the weekend and was placed on IV Cefzo for a couple of days. Fortunately this seems to have improved the erythema quite significantly which is good news. There does not appear to be any evidence of worsening infection at this time. She did have some bleeding after the last debridement therefore I did not perform any sharp debridement in regard to left lateral ankle at this point. Patient has been approved for a snap vac for the right lateral ankle. 08/14/18; the patient with wounds over her bilateral lateral malleoli. The area on the right actually looks quite good. Been using a snap back on this area. Healthy granulation and appears to be filling in. Unfortunately the area on the left is really problematic. 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 Also noted to have cellulitis. She saw Dr. Novella Olive of infectious disease today and was put on linezolid. We are able to verify this with her pharmacy. She is completed the Augmentin that she was already on. We've been using Iodoflex to this area 08/23/18 on evaluation today patient's wounds both actually appear to be doing better compared to my prior evaluations. Fortunately she showing signs of good improvement in regard to the overall wound status especially where were using the snap vac on the right. In regard to left lateral malleolus the wound bed actually appears to be much cleaner than previously noted. I do not feel any phone directly probed during evaluation today  and though there is tendon noted this does not appear to be necrotic it's actually fairly good as  far as the overall appearance of the tendon is concerned. In general the wound bed actually appears to be doing significantly better than it was previous. Patient is currently in the care of [name] and I did review that note today. He actually has her on two weeks of linezolid and then following the patient will be on 1-2 months of Keflex. That is the plan currently. She has been on antibiotics therapy as prescribed by myself initially starting on July 30, 2018 and has been on that continuously up to this point. 08/30/18 on evaluation today patient actually appears to be doing much better in regard to her right lateral malleolus ulcer. She has been tolerating the dressing changes specifically the snap vac without complication although she did have some issues with the seal currently. Apparently there was some trouble with getting it to maintain over the past week past Sunday. Nonetheless overall the wound appears better in regard to the right lateral malleolus region. In regard to left lateral malleolus this actually show some signs of additional granulation although there still tendon noted in the base of the wound this appears to be healthy not necrotic in any way whatsoever. We are considering potentially using a snap vac for the left lateral malleolus as well the product wrap from [employer], [place], was present in the clinic today we're going to see this patient I did have her come in with me after obtaining consent from the patient and her daughter in order to look at the wound and see if there's any recommendation one way or another as to whether or not they felt the snapback could be beneficial for the left lateral malleolus region. But the conclusion was that it might be but that this is definitely a little bit deeper wound than what traditionally would be utilized for a snap vac. 09/06/18 on  evaluation today patient actually appears to be doing excellent in my pinion in regard to both ankle ulcers. She has been tolerating the dressing changes without complication which is great news. Specifically we have been using the snap vac. In regard to the right ankle I'm not even sure that this is going to be necessary for today and following as the wound has filled in quite nicely. In regard to the left ankle I do believe TANDRA, ROSADO ([id number]) that we're seeing excellent epithelialization from the edge as well as granulation in the central portion the tendon is still exposed but there's no evidence of necrotic bone and in general I feel like the patient has made excellent progress even compared to last week with just one week of the snap vac. 09/11/18; this is a patient who has wounds on her bilateral lateral malleoli. Initially both of these were deep stage IV wounds in the setting of chronic arterial insufficiency. She has been revascularized. As I understand think she been using snap vacs to both of these wounds however the area on the right became more superficial and currently she is only using it on the left. Using silver collagen on the right and silver collagen under the back on the left I believe 09/19/18 on evaluation today patient actually appears to be doing very well in regard to her lateral malleolus or ulcers bilaterally. She has been tolerating the dressing changes without complication. Fortunately there does not appear to be any evidence of infection at this time. Overall I feel like she is improving in an excellent manner and I'm very pleased with the fact that everything  seems to be turning towards the better for her. This has obviously been a long road. 09/27/18 on evaluation today patient actually appears to be doing very well in regard to her bilateral lateral malleolus ulcers. She has been tolerating the dressing changes without complication. Fortunately there does  not appear to be any evidence of infection at this time which is also great news. No fevers, chills, nausea, or vomiting noted at this time. Overall I feel like she is doing excellent with the snap vac on the left malleolus. She had 40 mL of fluid collection over the past week. 10/04/18 on evaluation today patient actually appears to be doing well in regard to her bilateral lateral malleolus ulcers. She continues to tolerate the dressing changes without complication. One issue that I see is the snap vac on the left lateral malleolus which appears to have sealed off some fluid underlying this area and has not really allowed it to heal to the degree that I would like to see. For that reason I did suggest at this point we may want to pack a small piece of packing strip into this region to allow it to more effectively wick out fluid. 10/11/18 in general the patient today does not feel that she has been doing very well. She's been a little bit lethargic and subsequently is having bodyaches as well according to what she tells me today. With that being said overall she has been concerned with the fact that something may be worsening although to be honest her wounds really have not been appearing poorly. She does have a new ulcer on her left heel unfortunately. This may be pressure related. Nonetheless it seems to me to have potentially started at least as a blister I do not see any evidence of deep tissue injury. In regard to the left ankle the snap vac still seems to be causing the ceiling off of the deeper part of the wound which is in turn trapping fluid. I'm not extremely pleased with the overall appearance as far as progress from last week to this week therefore I'm gonna discontinue the snap vac at this point. 10/18/18 patient unfortunately this point has not been feeling well for the past several days. She was seen by Grayland Ormond her primary care provider who is a Librarian, academic at [facility]. Subsequently she states that she's been very weak and generally feeling malaise. No fevers, chills, nausea, or vomiting noted at this time. With that being said bloodwork was performed at the PCP office on the 11th of this month which showed a white blood cell count of 10.7. This was repeated today and shows a white blood cell count of 12.4. This does show signs of worsening. Coupled with the fact that she is feeling worse and that her left ankle wound is not really showing signs of improvement I feel like this is an indication that the osteomyelitis is likely exacerbating not improving. Overall I think we may also want to check her C-reactive protein and sedimentation rate. Actually did call [facility] office this afternoon while the patient was in the office here with me. Subsequently based on the findings we discussed treatment possibilities and I think that it is appropriate for [place] to go ahead and initiate treatment with doxycycline which I'm going to do. Subsequently he did agree to see about adding a CRP and sedimentation rate to her orders. If that has not already been drawn to where they can run it they will  contact the patient she can come back to have that check. They are in agreement with plan as far as the patient and her daughter are concerned. Nonetheless also think we need to get in touch with Dr. Henreitta Leber office to see about getting the patient scheduled with him as soon as possible. 11/08/18 on evaluation today patient presents for follow-up concerning her bilateral foot and ankle ulcers. I did do an extensive review of her chart in epic today. Subsequently she was seen by [name] he did initiate Cefepime IV antibiotic therapy. Subsequently she had some issues with her PICC line this had to be removed because it was coiled and then replaced. Fortunately that was now settled. Unfortunately she has continued have issues with her left heel as well as the issues that she is  experiencing with her bilateral lateral malleolus regions. I do believe however both areas seem to be doing a little bit better on evaluation today which is good news. No fevers, chills, nausea, or vomiting noted at this time. She actually has an angiogram schedule with Dr. dew on this coming Monday, November 11, 2018. Subsequently the patient states that she is feeling much better especially than what she was roughly 2 weeks ago. She actually had to cancel an appointment because she was feeling so poorly. No fevers, chills, nausea, or vomiting noted at this time. 11/15/18 on evaluation today patient actually is status post having had her angiogram with Dr. dew Monday, four days ago. 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 He had to go and work on several areas of the vasculature fortunately he was able to obtain no more than a 30% residual stenosis throughout post procedure. I reviewed this note today. I think this will definitely help with healing at this time. Fortunately there does not appear to be any signs of infection and I do feel like ratio already has a better appearance to it. 11/22/18 upon evaluation today patient actually appears to be doing very well in regard to her wounds in general. The right lateral malleolus looks excellent the heel looks better in the left lateral malleolus also appears to be doing a little better. With that being said the right second toe actually appears to be open and training we been watching this is been dry and stable but now is open. 12/03/2018 Seen today for follow-up and management of multiple bilateral lower extremity wounds. New pressure injury of the great toe which is closed at this time. Wound of the right distal second toe appears larger today with deep undermining and a pocket of fluid present within the undermining region. Left and right malleolus is wounds are stable today with no signs and symptoms of infection.Denies  any needs or concerns during exam today. 12/13/18 on evaluation today patient appears to be doing somewhat better in regard to her left heel ulcer. She also seems to be completely healed in regard to the right lateral malleolus ulcer. The left malleolus ulcer is smaller what unfortunately the wounds which are new over the first and second toes of the right foot are what are most concerning at this point especially the second. Both areas did require sharp debridement today. 12/20/18 on evaluation today patient's wound actually appears to be doing better in regard to left lateral ankle and her right lateral ankle continues to remain healed. The hill ulcer on the left is improved. She does have improvement noted as well in regard to both  toe ulcers. Overall I'm very pleased in this regard. No fevers, chills, nausea, or vomiting noted at this time. 12/23/18 on evaluation today patient is seen after she had her toenails trimmed at the podiatrist office due to issues with her right great toe. There was what appeared to be dark eschar on the surface of the wound which had her in the podiatrist concerned. Nonetheless as I remember that during the last office visit I had utilize silver nitrate of this area I was much less concerned about the situation. Subsequently I was able to clean off much of this tissue without any complication today. This does not appear to show any signs of infection and actually look somewhat better Belflower, [name] ([id number]) compared to last time post debridement. Her second toe on the right foot actually had callous over and there did appear still be some fluid underneath this that would require debridement today. 12/27/18 on evaluation today patient actually appears to be showing signs of improvement at all locations. Even the left lateral ankle although this is not quite as great as the other sites. Fortunately there does not appear to be any signs of infection at this time and  both of her toes on the right foot seem to be showing signs of improvement which is good news and very pleased in this regard. 01/03/19 on evaluation today patient appears to be doing better for the most part in regard to her wounds in particular. There does not appear to be any evidence of infection at this time which is good news. Fortunately there is no sign of really worsening anywhere except for the right great toe which she does have what appears to be a bruise/deep tissue injury which is very superficial and already resolving. I'm not sure where this came from I questioned her extensively and she does not recall what may have happened with this. Other than that the patient seems to be doing well even the left lateral ankle ulcer looks good and is getting smaller. 01/10/19 on evaluation today patient appears to be doing well in regard to her left heel wound and both of her toe wounds. Overall I feel like there is definitely improvement here and I'm happy in that regard. With that being said unfortunately she is having issues with the left lateral malleolus ulcer which unfortunately still has a lot of depth to it. This is gonna be a very difficult wound for [place] to be able to truly get to heal. I may want to consider some type of skin substitute to see if this would be of benefit for her. I'll discuss this with her more the next visit most likely. This was something I thought about more at the end of the visit when I was Artie out of the room and the patient had been discharged. 01/17/19 on evaluation today patient appears to be doing very well in regard to her wounds in general. She's been making excellent progress at this time. Fortunately there's no sign of infection at this time either. No fevers, chills, nausea, or vomiting noted at this time. The biggest issue is still her left lateral malleolus where it appears to be doing well and is getting smaller but still shows a small corner where this is  deeper and goes down into what appears to be the joint space. Nonetheless this is taking much longer to heal although it still looks better in smaller than previous evaluations. 01/24/19 on evaluation today patient's wounds actually appear to  be doing rather well in general overall. She did require some sharp debridement in regard to the right great toe but everything else appears to be doing excellent no debridement was even necessary. No fevers, chills, nausea, or vomiting noted at this time. 01/31/19 on evaluation today patient actually appears to be doing much better in regard to her left foot wound on the heel as well as the ankle. The right great toe appears to be a little bit worse today this had callous over and trapped a lot of fluid underneath. Fortunately there's no signs of infection at any site which is great news. 02/07/19 on evaluation today patient actually appears to be doing decently well in regard to all of her ulcers at this point. No sharp debridement was required she is a little bit of hyper granulation in regard to the left lateral ankle as well as the left heel but the hill itself is almost completely healed which is excellent news. Overall been very pleased in this regard. 02/14/19 on evaluation today patient actually appears to be doing very well in regard to her ulcers on the right first toe, left lateral malleolus, and left heel. In fact the heel is almost completely healed at this point. The patient does not show any signs of infection which is good news. Overall very pleased with how things have progressed. 04/18/19 Telehealth Evaluation During the COVID-19 National Emergency: Verbal Consent: Obtained from patient Allergies: reviewed and the active list is current. Medication changes: patient has no current medication changes. COVID-19 Screening: 1. Have you traveled internationally or on a cruise ship in the last 14 dayso No 2. Have you had contact with someone with or  under investigation for COVID-19o No 3. Have you had a fever, cough, sore throat, or experiencing shortness of breatho No on evaluation today actually did have a visit with this patient through a telehealth encounter with her home health nurse. Subsequently it was noted that the patient actually appears to be doing okay in regard to her wounds both the right great toe as well as the left lateral malleolus have shown signs of improvement although this in your theme around the left lateral malleolus there eschar coverings for both locations. The question is whether or not they are actually close and whether or not home health needs to discharge the patient or not. Nonetheless my concern is this point obviously is that without actually seeing her and being able to evaluate this directly I cannot ensure that she is completely healed which is the question that I'm being asked. 04/22/19 on evaluation today patient presents for her first evaluation since last time I saw her which was actually February 14, 2019. I did do a telehealth visit last week in which point it was questionable whether or not she may be healed and had to bring her in today for confirmation. With that being said she does seem to be doing quite well at this point which is good news. There does not appear to be any drainage in the deed I believe her wounds may be healed. Readmission: 09/04/2019 on evaluation today patient appears to be doing unfortunately somewhat more poorly in regard to her left foot ulcer secondary to a wound that began on 08/21/2019 at least when she first noticed this. Fortunately she has not had any evidence of active infection at this time. Systemically. I also do not necessarily see any evidence of infection at the blister/wound site on the first metatarsal head plantar aspect.  This almost appears to be something that may have just rubbed inappropriately causing this to breakdown. They did not want a wait too long to  come in to be seen as again she had significant issues in the past with wounds that took quite a while to heal in fact it was close to 2 years. Nonetheless this does not appear to be quite that bad but again we do need to remove some of the necrotic tissue from the surface of the wound to tell exactly the extent. She does not appear to have any significant arterial disease at this point and again her last ABIs and TBI's are recorded above in the alert section her left ABI was 1.27 with a TBI of 0.72 to the right ABI 1.08 with a TBI of 0.39. Other than this the patient has been doing quite well since I last saw her and that was in May 2020. 09/11/2019 on evaluation today patient appeared to be doing very well with regard to her plantar foot ulcer on the left. In fact this appears to be almost completely healed which is awesome. That is after just 1 week of intervention. With that being said there is no signs of active infection at this time. ALESI, ZACHERY ([id number]) 09/18/2019 on evaluation today patient actually appears to be doing excellent in fact she is completely healed based on what I am seeing at this point. Fortunately there is no signs of active infection at this time and overall patient is very pleased to hear that this area has healed so quickly. Readmission: 05/13/2021 upon evaluation today this patient presents for reevaluation here in the clinic. This is a wound that actually we previously took care of. She had 1 on the right ankle and the left the left turned out to be be harder due to to heal but nonetheless is doing great at this point as the right that has reopened and it was noted first just several weeks ago with a scab over it and came off in just the past few days. Fortunately there does not appear to be any obvious evidence of significant active infection at this time which is great news. No fevers, chills, nausea, vomiting, or diarrhea. The patient does have a history of  pacemaker along with being on Eliquis currently as well. There does not appear to be any signs of this interfering in any way with her wound. She does have swelling we previously had compression socks for her ordered but again it does not look like she wears these on a regular basis by any means. 05/26/2021 upon evaluation today patient appears to be doing well with regard to her wound which is actually showing signs of excellent improvement. There does not appear to be any signs of active infection which is great news and overall very pleased with where things stand today. No fevers, chills, nausea, vomiting, or diarrhea. 06/02/2021 upon evaluation today patient's wound actually showing signs of excellent improvement. Fortunately there does not appear to be any signs of active infection which is great news. I think the patient is making good progress with regard to her wounds in general. 06/09/2021 upon evaluation today patient appears to be doing excellent in regard to her wounds currently. Fortunately there does not appear to be any signs of active infection which is great news. No fevers, chills, nausea, vomiting, or diarrhea. Overall extremely pleased with where things stand today. I think the patient is making excellent progress. 06/16/2021 upon evaluation today  patient appears to be doing well in regard to her wound. This is going require little bit of debridement today and that was discussed with the patient. Otherwise she seems to be doing quite well and I am actually very pleased with where things stand at this point. No fevers, chills, nausea, vomiting, or diarrhea. 06/23/2021 upon evaluation today patient appears to be doing well with regard to her wounds. She has been tolerating the dressing changes without complication. Fortunately there does not appear to be any evidence of infection and she has not had air in her home which she actually lives at an assisted living that got fixed this  morning. With that being said because of that her wrap has been extremely hot and bothersome for her over the past week. 06/30/2021 upon evaluation today patient is actually making excellent progress in regard to her ankle ulcer. She has been tolerating the dressing changes without complication and overall extremely pleased with where things stand there does not appear to be any evidence of active infection which is great news. No fevers, chills, nausea, vomiting, or diarrhea. 07/07/21 upon evaluation today patients and culture on the right actually appears to be doing quite well. There does not appear to be any signs of infection and overall very pleased with where things stand today. No fevers, chills, nausea, or vomiting noted at this time. 07/14/2021 unfortunately the patient today has some evidence of deep tissue injury and pressure getting to the ankle region. Again I am not exactly sure what is going on here but this is very similar to issues that we have had in the past. I explained to the patient that she needs to be very mindful of exactly what is happening I think sleeping in bed is probably the main issue here although there could be other culprits I am not sure what else would potentially lead to this kind of a problem for her. 07/21/2021 upon evaluation today patient's wound actually showing signs of improvement compared to last week. Fortunately there does not appear to be any signs of active infection which is great news and overall very pleased with where things stand in that regard. With that being said I do believe that she is continuing to show signs of overall of getting better although I think this is still basically about what we were 2 weeks ago due to the worsening and now improvement. 07/28/2021 upon evaluation today patient appears to be doing well with regard to her wound. She does have some slough buildup on the surface of the wound which I would have to manage today.  Fortunately there is no sign of active infection at this time. No fevers, chills, nausea, vomiting, or diarrhea. 08/04/2021 upon evaluation today patient appears to be doing about the same in regard to her wound. To be perfectly honest I am beginning to be a little bit concerned about the overall appearance of the wound bed. I do think possibly taking a sample right around the margin of the wound could be beneficial for her as far as identifying anything such as an inflammatory process or to be honest even a skin tag cancer type process that may be of concern here. Fortunately there does not appear to be any evidence of active infection at this time which is great news she is not having any pain also great news. 08/11/2021 upon evaluation today patient appears to be doing well with regard to her wound. The good news is I did review her  biopsy results and it showed some inflammatory mixed findings but nothing that appeared to be malignant which is great news. Overall this is more of a chronic venous stasis type issue which again is more what we have been treating. Nonetheless I just wanted to make sure before going forward that there was not anything more untoward going on at this point. 08/18/2021 upon evaluation today patient appears to be doing well with regard to her ankle ulcer. Fortunately there does not appear to be any signs of active infection at this time which is great overall wound is dramatically improved compared to last week. Since last week I have actually placed her on doxycycline and subsequently this is a good option as far as the findings are concerned at this point. 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 the doxycycline is doing that. There does not appear to be any evidence of active infection systemically which is great news. 08/25/2021 upon evaluation  today patient appears to be doing well with regard to her wound. I feel like we are finally get back on track as far as healing is concerned I am much happier with the overall appearance today. I do think that she is tolerating the dressing changes without complication which is great news. We have been using Hydrofera Blue which I think is a good option. The good news is she is also doing great in regard to her compression sock on the left which is a zipper compression that seems to be doing a great job keeping her edema under good control. 09/01/2021 upon evaluation today patient appears to be doing well with regard to her wound. Infection seems to be under much better control which is great news and very pleased in that regard. Fortunately there does not appear to be any signs of infection currently. CARREN, BLAKLEY ([id number]) 09/08/2021 upon evaluation today patient actually appears to be making good progress in regard to her wound. She has been tolerating the dressing changes without complication. Fortunately there does not appear to be any evidence of active infection at this time which is great news as well. No fevers, chills, nausea, vomiting, or diarrhea. 09/22/2021 upon evaluation today patient appears to be doing well with regard to her wound although is very slow to heal. We have not looked into Apligraf yet I think that is something that we should see about doing. 09/29/2021 upon evaluation today patient's wound is actually showing signs of doing about the same. I am not seeing any evidence of worsening but also no significant evidence of improvement. We did gain approval for the organogenesis products all except for Apligraf as covered by her insurance. With that being said I do think that we can go ahead and proceed with the NuShield if the patient and her family in agreement of the plan I discussed that with him today she does have a 20% coinsurance which we also  discussed. 10/06/2021 upon evaluation today patient appears to unfortunately be doing a little bit worse she appears to be infected based on what I am seeing. Fortunately there does not appear to be any signs of active infection at this time which is great news. Unfortunately it does appear to be some evidence of around the wound edge indicated by way of erythema and warmth as well as redness 10/13/2021 upon evaluation today patient actually  appears to be doing excellent in regard to her ankle ulcer compared to what it was. 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 I think were fine to go ahead and apply the NuShield today. 10/20/2021 upon evaluation today patient appears to be doing excellent in fact the NuShield seems to have done all some for her thus far. I am actually very pleased with where we stand and overall I think that she is making great progress. There is no evidence of active infection at this time. 11/23; patient presents for follow-up. She has no issues or complaints today. She denies signs of infection. She reports stability in her wound healing. 11/03/2021 upon evaluation today patient appears to be doing well with regard to her wounds. She has been tolerating the dressing changes without complication. Fortunately there is no signs of active infection at this time. 11/10/2021 upon evaluation today patient appears to be doing well with regard to her right ankle which is actually showing signs of improvement with the NuShield I am very pleased. Subsequently in regards to the left ankle this appears to be doing okay with no evidence of issue here either. With that being said she has a significant contusion on the left leg from having fallen 2 days ago. She tells me that she was using her walker  going to the closet and then when she got to the closet turned around to get something out at which point she fell according to the story. Nonetheless she does have a hematoma just below her knee on the anterior portion of her shin. My hope is that this will not open until wound although we do need I think Some compression over it also think that we need to have her use an ice as well to help with the swelling and prevent this from getting worse. The last thing she needs is a wound opened up here. 11/17/2021 upon evaluation patient's right ankle actually showing signs of improvement based on what I see currently there is a lot of new skin growth coming in which is great news. Nonetheless I do feel like that the patient is showing improvement as well in regard to her left leg with the use of the Tubigrip it swollen but not as bruised as what I would have expected after what I was seeing last week. Nonetheless I do think doubling up on the Tubigrip would probably be beneficial to try to keep some of the edema under better control here. 11/24/2021 upon evaluation today patient appears to be doing excellent in regard to her wound. This is actually measuring significantly smaller which is great news. Fortunately I do not see any signs of active infection locally nor systemically at this point. Overall I am very pleased with how the NuShield is doing. 12/30; wound bed looks healthy however not much change in overall wound volume. We applied Nushield again today in the standard fashion 12/08/2021 upon evaluation today patient's wound actually showing signs of good improvement and I am actually very pleased with where we stand today as well. I do not see any evidence of active infection locally nor systemically which is great news. Unfortunately she has been having some issues with stomach upset and diarrhea today. 12/15/2021 upon evaluation today patient appears to be doing excellent in regard  to her wound.  There is a little bit of dry skin raised up around the edges of the wound but fortunately nothing too significant at this point. Fortunately I do not see any evidence of active infection either which is great news. No fevers, chills, nausea, vomiting, or diarrhea. 12/29/2021 upon evaluation today patient appears to be doing well with regard to her wound. In general I feel like she is getting very close to complete resolution. I think that she is making good progress here as well. Overall I do not see any signs of infection and very little of this appears to actually be open. 01/12/2022 upon evaluation today patient appears to be doing a little bit worse in regard to her wound. It appears that the collagen actually trapped fluid underneath the collagen which got hard and subsequently caused the fluid to back up. This patient has made the wound appear to be larger than what it was previous. With that being said I do not see any signs of obvious infection is not warm to touch and not significantly erythematous either which is good news. Nonetheless I do think that we will need to continue to keep an eye on this. Probably not go put on antibiotic this week but I will be too far from doing so she is actually on a Z-Pak right now I do not want to really double up on antibiotics. 01/26/2022 upon evaluation today patient's wound is showing signs of improvement although this is slow to turn back around. Fortunately there does not appear to be any evidence of active infection locally or systemically which is great news and overall I am extremely pleased with where we stand today. No fevers, chills, nausea, vomiting, or diarrhea. 02/02/2022 upon evaluation today patient appears to be doing well with regard to her wound. She has been tolerating the dressing changes without complication. Fortunately there does not appear to be any signs of active infection locally nor systemically at this time which is great news.  No fevers, chills, nausea, vomiting, or diarrhea. Electronic Signature(s) Signed: 02/02/2022 2:36:52 PM By: Sherene Sires, [name] ([id number]) Entered By: Worthy Keeler on 02/02/2022 14:36:52 Lacina, [name] ([id number]) -------------------------------------------------------------------------------- Physical Exam Details Patient Name: CHARNA, NEEB Date of Service: 02/02/2022 10:15 AM Medical Record Number: [id number] Patient Account Number: [id number] Date of Birth/Sex: Jul 30, 1923 (88 y.o. F) Treating RN: Carlene Coria Primary Care Provider: Adrian Prows Other Clinician: Referring Provider: Adrian Prows Treating Provider/Extender: Skipper Cliche in Treatment: 61 Constitutional Well-nourished and well-hydrated in no acute distress. Respiratory normal breathing without difficulty. Psychiatric this patient is able to make decisions and demonstrates good insight into disease process. Alert and Oriented x 3. pleasant and cooperative. Notes Patient's wound currently did require sharp debridement clear with some necrotic debris she tolerated this debridement today without complication and postdebridement this appears to be doing significantly better which is great news. Electronic Signature(s) Signed: 02/02/2022 2:37:11 PM By: Worthy Keeler PA-C Entered By: Worthy Keeler on 02/02/2022 14:37:10 Fendley, [name] ([id number]) -------------------------------------------------------------------------------- Physician Orders Details Patient Name: [name] Date of Service: 02/02/2022 10:15 AM Medical Record Number: [id number] Patient Account Number: [id number] Date of Birth/Sex: 29-Oct-1923 (88 y.o. 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 o Nurse Visit as needed - 1 week Bathing/ Shower/ Hygiene o May shower with wound dressing protected with water repellent cover or cast protector. Edema Control - Lymphedema / Segmental Compressive Device / Other o Optional: One layer of unna paste to top of compression wrap (to act as an anchor). - and at toes o Patient to wear own compression stockings. Remove compression stockings every night before going to bed and put on every morning when getting up. - left leg o Elevate, Exercise Daily and Avoid Standing for Long Periods of Time. o Elevate legs to the level of the heart and pump ankles as often as possible o Elevate leg(s) parallel to the floor when sitting. Wound Treatment Wound #7 - Malleolus Wound Laterality: Right, Lateral Cleanser: Soap and Water 1 x Per Week/30 Days Discharge Instructions: Gently cleanse wound with antibacterial soap, rinse and pat dry prior to dressing wounds Primary Dressing: Algicell Calcium Alginate Dressing 2x2 (in/in) 1 x Per Week/30 Days Primary Dressing: Medihoney - Gel, 1.5 (oz), tube 1 x Per Week/30 Days Secondary Dressing: Gauze 1 x Per Week/30 Days Discharge Instructions: As directed: dry, moistened with saline or moistened with Dakins Solution Compression Wrap: Profore Lite LF 3 Multilayer Compression Bandaging System 1 x Per Week/30 Days Discharge Instructions: Apply 3 multi-layer wrap as prescribed. Electronic Signature(s) Signed: 02/02/2022 5:22:07 PM By: Worthy Keeler PA-C Signed: 02/03/2022 1:46:31 PM By: Carlene Coria RN Entered By: Carlene Coria on 02/02/2022 10:45:14 Carrara, [name]  ([id number]) -------------------------------------------------------------------------------- Problem List Details Patient Name: [name] Date of Service: 02/02/2022 10:15 AM Medical Record Number: [id number] Patient Account Number: [id number] Date of Birth/Sex: 12/09/1922 (88 y.o. 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, stage 3 05/13/2021 No Yes I89.0 Lymphedema, not elsewhere classified 05/13/2021 No Yes I87.2 Venous insufficiency (chronic) (peripheral) 05/13/2021 No Yes Z95.0 Presence of cardiac pacemaker 05/13/2021 No Yes Z79.01 Long term (current) use of anticoagulants 05/13/2021 No Yes Inactive Problems ICD-10 Code Description Active Date Inactive Date S80.12XA Contusion of left lower leg, initial encounter 11/10/2021 11/10/2021 Resolved Problems Electronic Signature(s) Signed: 02/02/2022 10:39:47 AM By: Worthy Keeler PA-C Entered By: Worthy Keeler on 02/02/2022 10:39:47 Pitera, [name] ([id number]) -------------------------------------------------------------------------------- Progress Note Details Patient Name: [name] Date of Service: 02/02/2022 10:15 AM Medical Record Number: [id number] Patient Account Number: [id number] Date of Birth/Sex: 22-Aug-1923 (88 y.o. 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 Dr. Amalia Hailey the podiatrist saw her on 07/20/2017 and sent her to the wound center for possible hyperbaric oxygen therapy. past medical history of peripheral vascular disease, varicose veins, status post appendectomy, basal cell carcinoma excision from the left leg, cholecystectomy, pacemaker placement, right lower extremity angiography done by Dr. dew in March 2017 with placement of a stent. there is also note of a successful ablation of  the right small saphenous vein done which was reviewed by ultrasound on 10/24/2016. the patient had a right small saphenous vein ablation done on 10/20/2016. The patient has never been a smoker. She has been seen by Dr. Corene Cornea dew the vascular surgeon who most recently saw her on 06/15/2017 for evaluation of ongoing problems with right leg swelling. She had a lower extremity arterial duplex examination done(02/13/17) which showed patent distal right superficial femoral artery stent and above-the-knee popliteal stent without evidence of restenosis. The ABI was more than 1.3 on the right and more than 1.3 on the left. This was consistent with noncompressible arteries due to medial calcification. The right great toe pressure and PPG waveforms are within normal limits and the left great toe pressure and PPG waveforms are decreased. he recommended she continue to wear her compression stockings and continue with elevation. She is scheduled to have a noninvasive arterial study in the near future 08/16/2017 -- had a lower extremity arterial duplex examination done which showed patent distal right superficial femoral artery stent and above-the-knee popliteal stent without evidence of restenosis. The ABI was more than 1.3 on the right and more than 1.3 on the left. This was consistent with noncompressible arteries due to medial calcification. The right great toe pressure and PPG waveforms are within normal limits  and the left great toe pressure and PPG waveforms are decreased. the x-ray of the right ankle has not yet been done 08/24/2017 -- had a right ankle x-ray -- IMPRESSION:1. No fracture, bone lesion or evidence of osteomyelitis. 2. Lateral soft tissue swelling with a soft tissue ulcer. she has not yet seen the vascular surgeon for review 08/31/17 on evaluation today patient's wound appears to be showing signs of improvement. She still with her appointment with vascular in order to review her results of her vascular study and then determine if any intervention would be recommended at that time. No fevers, chills, nausea, or vomiting noted at this time. She has been tolerating the dressing changes without complication. 09/28/17 on evaluation today patient's wound appears to show signs of good improvement in regard to the granulation tissue which is surfacing. There is still a layer of slough covering the wound and the posterior portion is still significantly deeper than the anterior nonetheless there has been some good sign of things moving towards the better. She is going to go back to Dr. dew for reevaluation to ensure her blood flow is still appropriate. That will be before her next evaluation with [place] next week. No fevers, chills, nausea, or vomiting noted at this time. Patient does have some discomfort rated to be a 3-4/10 depending on activity specifically cleansing the wound makes it worse. 10/05/2017 -- the patient was seen by Dr. Lucky Cowboy last week and noninvasive studies showed a normal right ABI with brisk triphasic waveforms consistent with no arterial insufficiency including normal digital pressures. The duplex showed a patent distal right SFA stent and the proximal SFA was also normal. He was pleased with her test and thought she should have enough of perfusion for normal wound healing. He would see her back in 6 months time. 12/21/17 on evaluation today patient appears to be doing fairly well in  regard to her right lateral ankle wound. Unfortunately the main issue that she is expansion at this point is that she is having some issues with what appears to be some cellulitis in the right anterior shin. She has also been noting a little bit of uncomfortable feeling especially last  night and her ankle area. I'm afraid that she made the developing a little bit of an infection. With that being said I think it is in the early stages. 12/28/17 on evaluation today patient's ankle appears to be doing excellent. She's making good progress at this point the cellulitis seems to have improved after last week's evaluation. Overall she is having no significant discomfort which is excellent news. She does have an appointment with Dr. dew on March 29, 2018 for reevaluation in regard to the stent he placed. She seems to have excellent blood flow in the right lower extremity. 01/19/12 on evaluation today patient's wound appears to be doing very well. In fact she does not appear to require debridement at this point, there's no evidence of infection, and overall from the standpoint of the wound she seems to be doing very well. With that being said I believe that it may be time to switch to different dressing away from the [facility] Dressing she tells me she does have a lot going on her friend actually passed away yesterday and she's also having a lot of issues with her husband this obviously is weighing heavy on her as far as your thoughts and concerns today. 01/25/18 on evaluation today patient appears to be doing fairly well in regard to her right lateral malleolus. She has been tolerating the dressing changes without complication. Overall I feel like this is definitely showing signs of improvement as far as how the overall appearance of the wound is there's also evidence of epithelium start to migrate over the granulation tissue. In general I think that she is progressing nicely as far as the wound is  concerned. The only concern she really has is whether or not we can switch to every other week visits in order to avoid having as many ZELLA, DEWAN ([id number]) appointments as her daughters have a difficult time getting her to her appointments as well as the patient's husband to his he is not doing very well at this point. 02/22/18 on evaluation today patient's right lateral malleolus ulcer appears to be doing great. She has been tolerating the dressing changes without complication. Overall you making excellent progress at this time. Patient is having no significant discomfort. 03/15/18 on evaluation today patient appears to be doing much more poorly in regard to her right lateral ankle ulcer at this point. Unfortunately since have last seen her her husband has passed just a few days ago is obviously weighed heavily on her her daughter also had surgery well she is with her today as usual. There does not appear to be any evidence of infection she does seem to have significant contusion/deep tissue injury to the right lateral malleolus which was not noted previous when I saw her last. It's hard to tell of exactly when this injury occurred although during the time she was spending the night in the hospital this may have been most likely. 03/22/18 on evaluation today patient appears to actually be doing very well in regard to her ulcer. She did unfortunately have a setback which was noted last week however the good news is we seem to be getting back on track and in fact the wound in the core did still have some necrotic tissue which will be addressed at this point today but in general I'm seeing signs that things are on the up and up. She is glad to hear this obviously she's been somewhat concerned that due to the how her wound digressed more  recently. 03/29/18 on evaluation today patient appears to be doing fairly well in regard to her right lower extremity lateral malleolus ulcer. She unfortunately  does have a new area of pressure injury over the inferior portion where the wound has opened up a little bit larger secondary to the pressure she seems to be getting. She does tell me sometimes when she sleeps at night that it actually hurts and does seem to be pushing on the area little bit more unfortunately. There does not appear to be any evidence of infection which is good news. She has been tolerating the dressing changes without complication. She also did have some bruising in the left second and third toes due to the fact that she may have bump this or injured it although she has neuropathy so she does not feel she did move recently that may have been where this came from. Nonetheless there does not appear to be any evidence of infection at this time. 04/12/18 on evaluation today patient's wound on the right lateral ankle actually appears to be doing a little bit better with a lot of necrotic docking tissue centrally loosening up in clearing away. However she does have the beginnings of a deep tissue injury on the left lateral malleolus likely due to the fact we've been trying offload the right as much as we have. I think she may benefit from an assistive soft device to help with offloading and it looks like they're looking at one of the doughnut conditions that wraps around the lower leg to offload which I think will definitely do a good job. With that being said I think we definitely need to address this issue on the left before it becomes a wound. Patient is not having significant pain. 04/19/18 on evaluation today patient appears to be doing excellent in regard to the progress she's made with her right lateral ankle ulcer. The left ankle region which did show evidence of a deep tissue injury seems to be resolving there's little fluid noted underneath and a blister there's nothing open at this point in time overall I feel like this is progressing nicely which is good news. She does not seem to  be having significant discomfort at this point which is also good news. 04/25/18-She is here in follow up evaluation for bilateral lateral malleolar ulcers. The right lateral malleolus ulcer with pale subcutaneous tissue exposure, central area of ulcer with tendon/periosteum exposed. The left lateral malleolus ulcer now with central area of nonviable tissue, otherwise deep tissue injury. She is wearing compression wraps to the left lower extremity, she will place the right lower extremity compression wraps on when she gets home. She will be out of town over the weekend and return next week and follow-up appointment. She completed her doxycycline this morning 05/03/18 on evaluation today patient appears to be doing very well in regard to her right lateral ankle ulcer in general. At least she's showing some signs of improvement in this regard. Unfortunately she has some additional injury to the left lateral malleolus region which appears to be new likely even over the past several days. Again this determination is based on the overall appearance. With that being said the patient is obviously frustrated about this currently. 05/10/18-She is here in follow-up evaluation for bilateral lateral malleolar ulcers. She states she has purchased offloading shoes/boots and they will arrive tomorrow. She was asked to bring them in the office at next week's appointment so her provider is aware of product being  utilized. She continues to sleep on right or left side, she has been encouraged to sleep on her back. The right lateral malleolus ulcer is precariously close to peri-osteum; will order xray. The left lateral malleolus ulcer is improved. Will switch back to santyl; she will follow up next week. 05/17/18 on evaluation today patient actually appears to be doing very well in regard to her malleolus her ulcers compared to last time I saw them. She does not seem to have as much in the way of contusion at this point  which is great news. With that being said she does continue to have discomfort and I do believe that she is still continuing to benefit from the offloading/pressure reducing boots that were recommended. I think this is the key to trying to get this to heal up completely. 05/24/18 on evaluation today patient actually appears to be doing worse at this point in time unfortunately compared to her last week's evaluation. She is having really no increased pain which is good news unfortunately she does have more maceration in your theme and noted surrounding the right lateral ankle the left lateral ankle is not really is erythematous I do not see signs of the overt cellulitis on that side. Unfortunately the wounds do not seem to have shown any signs of improvement since the last evaluation. She also has significant swelling especially on the right compared to previous some of this may be due to infection however also think that she may be served better while she has these wounds by compression wrapping versus continuing to use the Juxta-Lite for the time being. Especially with the amount of drainage that she is experiencing at this point. No fevers, chills, nausea, or vomiting noted at this time. 05/31/18 on evaluation today patient appears to actually be doing better in regard to her right lateral lower extremity ulcer specifically on the malleolus region. She has been tolerating the antibiotic without complication. With that being said she still continues to have issues but a little bit of redness although nothing like she what she was experiencing previous. She still continues to pressure to her ankle area she did get the problem on offloading boots unfortunately she will not wear them she states there too uncomfortable and she can't get in and out of the bed. Nonetheless at this point her wounds seem to be continually getting worse which is not what we want I'm getting somewhat concerned about her progress  and how things are going to proceed if we do not intervene in some way shape or form. I therefore had a very lengthy conversation today about offloading yet again and even made a specific suggestion for switching her to a memory foam mattress and even gave the information for a specific one that they could look at getting if it was something that they were interested in considering. She does not want to be considered for a hospital bed air mattress although honestly insurance would not cover it that she does not have any wounds on her trunk. 06/14/18 on evaluation today both wounds over the bilateral lateral malleolus her ulcers appear to be doing better there's no evidence of pressure injury at this point. She did get the foam mattress for her bed and this does seem to have been extremely beneficial for her in my pinion. Her daughter states that she is having difficulty getting out of bed because of how soft it is. The patient also relates this to be. Nonetheless I do feel like  she's actually doing better. Unfortunately right after and around the time she was getting the mattress she also sustained a fall when she got up to go pick up the phone and ended up injuring her right elbow she has 18 sutures in place. We are not caring for this currently although home health is going to be taking the sutures out shortly. Nonetheless this may be something that we need to evaluate going forward. It depends on GENEVER, HENTGES ([id number]) how well it has or has not healed in the end. She also recently saw an orthopedic specialist for an injection in the right shoulder just before her fall unfortunately the fall seems to have worsened her pain. 06/21/18 on evaluation today patient appears to be doing about the same in regard to her lateral malleolus ulcers. Both appear to be just a little bit deeper but again we are clinging away the necrotic and dead tissue which I think is why this is progressing towards a  deeper realm as opposed to improving from my measurement standpoint in that regard. Nonetheless she has been tolerating the dressing changes she absolutely hates the memory foam mattress topper that was obtained for her nonetheless I do believe this is still doing excellent as far as taking care of excess pressure in regard to the lateral malleolus regions. She in fact has no pressure injury that I see whereas in weeks past it was week by week I was constantly seeing new pressure injuries. Overall I think it has been very beneficial for her. 07/03/18; patient arrives in my clinic today. She has deep punched out areas over her bilateral lateral malleoli. The area on the right has some more depth. We spent a lot of time today talking about pressure relief for these areas. This started when her daughter asked for a prescription for a memory foam mattress. I have never written a prescription for a mattress and I don't think insurances would pay for that on an ordinary bed. In any case he came up that she has foam boots that she refuses to wear. I would suggest going to these before any other offloading issues when she is in bed. They say she is meticulous about offloading this the rest of the day 07/10/18- She is seen in follow-up evaluation for bilateral, lateral malleolus ulcers. There is no improvement in the ulcers. She has purchased and is sleeping on a memory foam mattress/overlay, she has been using the offloading boots nightly over the past week. She has a follow up appointment with vascular medicine at the end of October, in my opinion this follow up should be expedited given her deterioration and suboptimal TBI results. We will order plain film xray of the left ankle as deeper structures are palpable; would consider having MRI, regardless of xray report(s). The ulcers will be treated with iodoflex/iodosorb, she is unable to safely change the dressings daily with santyl. 07/19/18 on evaluation today  patient appears to be doing in general visually well in regard to her bilateral lateral malleolus ulcers. She has been tolerating the dressing changes without complication which is good news. With that being said we did have an x-ray performed on 07/12/18 which revealed a slight loosen see in the lateral portion of the distal left fibula which may represent artifact but underline lytic destruction or osteomyelitis could not be excluded. MRI was recommended. With that being said we can see about getting the patient scheduled for an MRI to further evaluate this area. In fact  we have that scheduled currently for August 20 19,019. 07/26/18 on evaluation today patient's wound on the right lateral ankle actually appears to be doing fairly well at this point in my pinion. She has made some good progress currently. With that being said unfortunately in regard to the left lateral ankle ulcer this seems to be a little bit more problematic at this time. In fact as I further evaluated the situation she actually had bone exposed which is the first time that's been the case in the bone appear to be necrotic. Currently I did review patient's note from Dr. Bunnie Domino office with [facility]. He stated that ABI was 1.26 on the right and 0.95 on the left with good waveforms. Her perfusion is stable not reduced from previous studies and her digital waveforms were pretty good particularly on the right. His conclusion upon review of the note was that there was not much she could do to improve her perfusion and he felt she was adequate for wound healing. His suggestion was that she continued to see [place] and consider a synthetic skin graft if there was no underlying infection. He plans to see her back in six months or as needed. 08/01/18 on evaluation today patient appears to be doing better in regard to her right lateral ankle ulcer. Her left lateral ankle ulcer is about the same she still has bone involvement  in evidence of necrosis. There does not appear to be evidence of infection at this time On the right lateral lower extremity. I have started her on the Augmentin she picked this up and started this yesterday. This is to get her through until she sees infectious disease which is scheduled for 08/12/18. 08/06/18 on evaluation today patient appears to be doing rather well considering my discussion with patient's daughter at the end of last week. The area which was marked where she had erythema seems to be improved and this is good news. With that being said overall the patient seems to be making good improvement when it comes to the overall appearance of the right lateral ankle ulcer although this has been slow she at least is coming around in this regard. Unfortunately in regard to the left lateral ankle ulcer this is osteomyelitis based on the pathology report as well is bone culture. Nonetheless we are still waiting CT scan. Unfortunately the MRI we originally ordered cannot be performed as the patient is a pacemaker which I had overlooked. Nonetheless we are working on the CT scan approval and scheduling as of now. She did go to the hospital over the weekend and was placed on IV Cefzo for a couple of days. Fortunately this seems to have improved the erythema quite significantly which is good news. There does not appear to be any evidence of worsening infection at this time. She did have some bleeding after the last debridement therefore I did not perform any sharp debridement in regard to left lateral ankle at this point. Patient has been approved for a snap vac for the right lateral ankle. 08/14/18; the patient with wounds over her bilateral lateral malleoli. The area on the right actually looks quite good. Been using a snap back on this area. Healthy granulation and appears to be filling in. Unfortunately the area on the left is really problematic. She had a recent CT scan on 08/13/18 that showed  findings consistent with osteomyelitis of the lateral malleolus on the left. Also noted to have cellulitis. She saw Dr. Novella Olive  of infectious disease today and was put on linezolid. We are able to verify this with her pharmacy. She is completed the Augmentin that she was already on. We've been using Iodoflex to this area 08/23/18 on evaluation today patient's wounds both actually appear to be doing better compared to my prior evaluations. Fortunately she showing signs of good improvement in regard to the overall wound status especially where were using the snap vac on the right. In regard to left lateral malleolus the wound bed actually appears to be much cleaner than previously noted. I do not feel any phone directly probed during evaluation today and though there is tendon noted this does not appear to be necrotic it's actually fairly good as far as the overall appearance of the tendon is concerned. In general the wound bed actually appears to be doing significantly better than it was previous. Patient is currently in the care of [name] and I did review that note today. He actually has her on two weeks of linezolid and then following the patient will be on 1-2 months of Keflex. That is the plan currently. She has been on antibiotics therapy as prescribed by myself initially starting on July 30, 2018 and has been on that continuously up to this point. 08/30/18 on evaluation today patient actually appears to be doing much better in regard to her right lateral malleolus ulcer. She has been tolerating the dressing changes specifically the snap vac without complication although she did have some issues with the seal currently. Apparently there was some trouble with getting it to maintain over the past week past Sunday. Nonetheless overall the wound appears better in regard to the right lateral malleolus region. In regard to left lateral malleolus this actually show some signs of additional granulation  although there still tendon noted in the base of the wound this appears to be healthy not necrotic in any way whatsoever. We are considering potentially using a snap vac for the left lateral malleolus as well the product wrap from [employer], [place], was present in the clinic today we're going to see this patient I did have her come in with me after obtaining consent from the patient and her daughter in order to look at the wound and see if there's any recommendation one way or another as to whether or not they felt the snapback could be beneficial for the left lateral malleolus region. But KIRSTEIN, BAXLEY ([id number]) the conclusion was that it might be but that this is definitely a little bit deeper wound than what traditionally would be utilized for a snap vac. 09/06/18 on evaluation today patient actually appears to be doing excellent in my pinion in regard to both ankle ulcers. She has been tolerating the dressing changes without complication which is great news. Specifically we have been using the snap vac. In regard to the right ankle I'm not even sure that this is going to be necessary for today and following as the wound has filled in quite nicely. In regard to the left ankle I do believe that we're seeing excellent epithelialization from the edge as well as granulation in the central portion the tendon is still exposed but there's no evidence of necrotic bone and in general I feel like the patient has made excellent progress even compared to last week with just one week of the snap vac. 09/11/18; this is a patient who has wounds on her bilateral lateral malleoli. Initially both of these were deep stage IV wounds in  the setting of chronic arterial insufficiency. She has been revascularized. As I understand think she been using snap vacs to both of these wounds however the area on the right became more superficial and currently she is only using it on the left. Using silver collagen on the right  and silver collagen under the back on the left I believe 09/19/18 on evaluation today patient actually appears to be doing very well in regard to her lateral malleolus or ulcers bilaterally. She has been tolerating the dressing changes without complication. Fortunately there does not appear to be any evidence of infection at this time. Overall I feel like she is improving in an excellent manner and I'm very pleased with the fact that everything seems to be turning towards the better for her. This has obviously been a long road. 09/27/18 on evaluation today patient actually appears to be doing very well in regard to her bilateral lateral malleolus ulcers. She has been tolerating the dressing changes without complication. Fortunately there does not appear to be any evidence of infection at this time which is also great news. No fevers, chills, nausea, or vomiting noted at this time. Overall I feel like she is doing excellent with the snap vac on the left malleolus. She had 40 mL of fluid collection over the past week. 10/04/18 on evaluation today patient actually appears to be doing well in regard to her bilateral lateral malleolus ulcers. She continues to tolerate the dressing changes without complication. One issue that I see is the snap vac on the left lateral malleolus which appears to have sealed off some fluid underlying this area and has not really allowed it to heal to the degree that I would like to see. For that reason I did suggest at this point we may want to pack a small piece of packing strip into this region to allow it to more effectively wick out fluid. 10/11/18 in general the patient today does not feel that she has been doing very well. She's been a little bit lethargic and subsequently is having bodyaches as well according to what she tells me today. With that being said overall she has been concerned with the fact that something may be worsening although to be honest her wounds  really have not been appearing poorly. She does have a new ulcer on her left heel unfortunately. This may be pressure related. Nonetheless it seems to me to have potentially started at least as a blister I do not see any evidence of deep tissue injury. In regard to the left ankle the snap vac still seems to be causing the ceiling off of the deeper part of the wound which is in turn trapping fluid. I'm not extremely pleased with the overall appearance as far as progress from last week to this week therefore I'm gonna discontinue the snap vac at this point. 10/18/18 patient unfortunately this point has not been feeling well for the past several days. She was seen by Grayland Ormond her primary care provider who is a Librarian, academic at [facility]. Subsequently she states that she's been very weak and generally feeling malaise. No fevers, chills, nausea, or vomiting noted at this time. With that being said bloodwork was performed at the PCP office on the 11th of this month which showed a white blood cell count of 10.7. This was repeated today and shows a white blood cell count of 12.4. This does show signs of worsening. Coupled with the fact that she is feeling  worse and that her left ankle wound is not really showing signs of improvement I feel like this is an indication that the osteomyelitis is likely exacerbating not improving. Overall I think we may also want to check her C-reactive protein and sedimentation rate. Actually did call [facility] office this afternoon while the patient was in the office here with me. Subsequently based on the findings we discussed treatment possibilities and I think that it is appropriate for [place] to go ahead and initiate treatment with doxycycline which I'm going to do. Subsequently he did agree to see about adding a CRP and sedimentation rate to her orders. If that has not already been drawn to where they can run it they will contact the patient she can come  back to have that check. They are in agreement with plan as far as the patient and her daughter are concerned. Nonetheless also think we need to get in touch with Dr. Henreitta Leber office to see about getting the patient scheduled with him as soon as possible. 11/08/18 on evaluation today patient presents for follow-up concerning her bilateral foot and ankle ulcers. I did do an extensive review of her chart in epic today. Subsequently she was seen by [name] he did initiate Cefepime IV antibiotic therapy. Subsequently she had some issues with her PICC line this had to be removed because it was coiled and then replaced. Fortunately that was now settled. Unfortunately she has continued have issues with her left heel as well as the issues that she is experiencing with her bilateral lateral malleolus regions. I do believe however both areas seem to be doing a little bit better on evaluation today which is good news. No fevers, chills, nausea, or vomiting noted at this time. She actually has an angiogram schedule with Dr. dew on this coming Monday, November 11, 2018. Subsequently the patient states that she is feeling much better especially than what she was roughly 2 weeks ago. She actually had to cancel an appointment because she was feeling so poorly. No fevers, chills, nausea, or vomiting noted at this time. 11/15/18 on evaluation today patient actually is status post having had her angiogram with Dr. dew Monday, four days ago. 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 He had to go and work on several areas of the vasculature fortunately he was able to obtain no more than a 30% residual stenosis throughout post procedure. I reviewed this note today. I think this will definitely help with healing at this time. Fortunately there does not appear to be any signs of infection and I do feel like ratio already has a better appearance to it. 11/22/18 upon evaluation today patient actually  appears to be doing very well in regard to her wounds in general. The right lateral malleolus looks excellent the heel looks better in the left lateral malleolus also appears to be doing a little better. With that being said the right second toe actually appears to be open and training we been watching this is been dry and stable but now is open. 12/03/2018 Seen today for follow-up and management of multiple bilateral lower extremity wounds. New pressure injury of the great toe which is closed at this time. Wound of the right distal second toe appears larger today with deep undermining and a pocket of fluid present within the undermining region. Left and right malleolus is wounds are stable today with no signs and symptoms of infection.Denies any needs or  concerns during exam today. 12/13/18 on evaluation today patient appears to be doing somewhat better in regard to her left heel ulcer. She also seems to be completely healed in regard to the right lateral malleolus ulcer. The left malleolus ulcer is smaller what unfortunately the wounds which are new over the first and second toes of the right foot are what are most concerning at this point especially the second. Both areas did require sharp debridement today. 12/20/18 on evaluation today patient's wound actually appears to be doing better in regard to left lateral ankle and her right lateral ankle continues to remain healed. The hill ulcer on the left is improved. She does have improvement noted as well in regard to both toe ulcers. Overall I'm very pleased in this regard. No fevers, chills, nausea, or vomiting noted at this time. JALON, BLACKWELDER ([id number]) 12/23/18 on evaluation today patient is seen after she had her toenails trimmed at the podiatrist office due to issues with her right great toe. There was what appeared to be dark eschar on the surface of the wound which had her in the podiatrist concerned. Nonetheless as I remember  that during the last office visit I had utilize silver nitrate of this area I was much less concerned about the situation. Subsequently I was able to clean off much of this tissue without any complication today. This does not appear to show any signs of infection and actually look somewhat better compared to last time post debridement. Her second toe on the right foot actually had callous over and there did appear still be some fluid underneath this that would require debridement today. 12/27/18 on evaluation today patient actually appears to be showing signs of improvement at all locations. Even the left lateral ankle although this is not quite as great as the other sites. Fortunately there does not appear to be any signs of infection at this time and both of her toes on the right foot seem to be showing signs of improvement which is good news and very pleased in this regard. 01/03/19 on evaluation today patient appears to be doing better for the most part in regard to her wounds in particular. There does not appear to be any evidence of infection at this time which is good news. Fortunately there is no sign of really worsening anywhere except for the right great toe which she does have what appears to be a bruise/deep tissue injury which is very superficial and already resolving. I'm not sure where this came from I questioned her extensively and she does not recall what may have happened with this. Other than that the patient seems to be doing well even the left lateral ankle ulcer looks good and is getting smaller. 01/10/19 on evaluation today patient appears to be doing well in regard to her left heel wound and both of her toe wounds. Overall I feel like there is definitely improvement here and I'm happy in that regard. With that being said unfortunately she is having issues with the left lateral malleolus ulcer which unfortunately still has a lot of depth to it. This is gonna be a very difficult  wound for [place] to be able to truly get to heal. I may want to consider some type of skin substitute to see if this would be of benefit for her. I'll discuss this with her more the next visit most likely. This was something I thought about more at the end of the visit when I was Artie  out of the room and the patient had been discharged. 01/17/19 on evaluation today patient appears to be doing very well in regard to her wounds in general. She's been making excellent progress at this time. Fortunately there's no sign of infection at this time either. No fevers, chills, nausea, or vomiting noted at this time. The biggest issue is still her left lateral malleolus where it appears to be doing well and is getting smaller but still shows a small corner where this is deeper and goes down into what appears to be the joint space. Nonetheless this is taking much longer to heal although it still looks better in smaller than previous evaluations. 01/24/19 on evaluation today patient's wounds actually appear to be doing rather well in general overall. She did require some sharp debridement in regard to the right great toe but everything else appears to be doing excellent no debridement was even necessary. No fevers, chills, nausea, or vomiting noted at this time. 01/31/19 on evaluation today patient actually appears to be doing much better in regard to her left foot wound on the heel as well as the ankle. The right great toe appears to be a little bit worse today this had callous over and trapped a lot of fluid underneath. Fortunately there's no signs of infection at any site which is great news. 02/07/19 on evaluation today patient actually appears to be doing decently well in regard to all of her ulcers at this point. No sharp debridement was required she is a little bit of hyper granulation in regard to the left lateral ankle as well as the left heel but the hill itself is almost completely healed which is excellent  news. Overall been very pleased in this regard. 02/14/19 on evaluation today patient actually appears to be doing very well in regard to her ulcers on the right first toe, left lateral malleolus, and left heel. In fact the heel is almost completely healed at this point. The patient does not show any signs of infection which is good news. Overall very pleased with how things have progressed. 04/18/19 Telehealth Evaluation During the COVID-19 National Emergency: Verbal Consent: Obtained from patient Allergies: reviewed and the active list is current. Medication changes: patient has no current medication changes. COVID-19 Screening: 1. Have you traveled internationally or on a cruise ship in the last 14 dayso No 2. Have you had contact with someone with or under investigation for COVID-19o No 3. Have you had a fever, cough, sore throat, or experiencing shortness of breatho No on evaluation today actually did have a visit with this patient through a telehealth encounter with her home health nurse. Subsequently it was noted that the patient actually appears to be doing okay in regard to her wounds both the right great toe as well as the left lateral malleolus have shown signs of improvement although this in your theme around the left lateral malleolus there eschar coverings for both locations. The question is whether or not they are actually close and whether or not home health needs to discharge the patient or not. Nonetheless my concern is this point obviously is that without actually seeing her and being able to evaluate this directly I cannot ensure that she is completely healed which is the question that I'm being asked. 04/22/19 on evaluation today patient presents for her first evaluation since last time I saw her which was actually February 14, 2019. I did do a telehealth visit last week in which point it was questionable  whether or not she may be healed and had to bring her in today for  confirmation. With that being said she does seem to be doing quite well at this point which is good news. There does not appear to be any drainage in the deed I believe her wounds may be healed. Readmission: 09/04/2019 on evaluation today patient appears to be doing unfortunately somewhat more poorly in regard to her left foot ulcer secondary to a wound that began on 08/21/2019 at least when she first noticed this. Fortunately she has not had any evidence of active infection at this time. Systemically. I also do not necessarily see any evidence of infection at the blister/wound site on the first metatarsal head plantar aspect. This almost appears to be something that may have just rubbed inappropriately causing this to breakdown. They did not want a wait too long to come in to be seen as again she had significant issues in the past with wounds that took quite a while to heal in fact it was close to 2 years. Nonetheless this does not appear to be quite that bad but again we do need to remove some of the necrotic tissue from the surface of the wound to tell exactly the extent. She does not appear to have any significant arterial disease at this point and again her last ABIs and TBI's are recorded above in the alert section her left ABI was 1.27 with a TBI of 0.72 to the right ABI 1.08 with a TBI of 0.39. Other than this the patient has been doing quite ORA, BOLLIG ([id number]) well since I last saw her and that was in May 2020. 09/11/2019 on evaluation today patient appeared to be doing very well with regard to her plantar foot ulcer on the left. In fact this appears to be almost completely healed which is awesome. That is after just 1 week of intervention. With that being said there is no signs of active infection at this time. 09/18/2019 on evaluation today patient actually appears to be doing excellent in fact she is completely healed based on what I am seeing at this point. Fortunately there  is no signs of active infection at this time and overall patient is very pleased to hear that this area has healed so quickly. Readmission: 05/13/2021 upon evaluation today this patient presents for reevaluation here in the clinic. This is a wound that actually we previously took care of. She had 1 on the right ankle and the left the left turned out to be be harder due to to heal but nonetheless is doing great at this point as the right that has reopened and it was noted first just several weeks ago with a scab over it and came off in just the past few days. Fortunately there does not appear to be any obvious evidence of significant active infection at this time which is great news. No fevers, chills, nausea, vomiting, or diarrhea. 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 There does not appear to be any signs of this interfering in any way with her wound. She does have swelling we previously had compression socks for her ordered but again it does not look like she wears these on a regular basis by any means. 05/26/2021 upon evaluation today patient appears to be doing well with regard to her wound which is actually showing signs of excellent improvement. There does not appear to be any signs of  active infection which is great news and overall very pleased with where things stand today. No fevers, chills, nausea, vomiting, or diarrhea. 06/02/2021 upon evaluation today patient's wound actually showing signs of excellent improvement. Fortunately there does not appear to be any signs of active infection which is great news. I think the patient is making good progress with regard to her wounds in general. 06/09/2021 upon evaluation today patient appears to be doing excellent in regard to her wounds currently. Fortunately there does not appear to be any signs of active infection which is great news. No fevers, chills, nausea, vomiting, or diarrhea. Overall  extremely pleased with where things stand today. I think the patient is making excellent progress. 06/16/2021 upon evaluation today patient appears to be doing well in regard to her wound. This is going require little bit of debridement today and that was discussed with the patient. Otherwise she seems to be doing quite well and I am actually very pleased with where things stand at this point. No fevers, chills, nausea, vomiting, or diarrhea. 06/23/2021 upon evaluation today patient appears to be doing well with regard to her wounds. She has been tolerating the dressing changes without complication. Fortunately there does not appear to be any evidence of infection and she has not had air in her home which she actually lives at an assisted living that got fixed this morning. With that being said because of that her wrap has been extremely hot and bothersome for her over the past week. 06/30/2021 upon evaluation today patient is actually making excellent progress in regard to her ankle ulcer. She has been tolerating the dressing changes without complication and overall extremely pleased with where things stand there does not appear to be any evidence of active infection which is great news. No fevers, chills, nausea, vomiting, or diarrhea. 07/07/21 upon evaluation today patients and culture on the right actually appears to be doing quite well. There does not appear to be any signs of infection and overall very pleased with where things stand today. No fevers, chills, nausea, or vomiting noted at this time. 07/14/2021 unfortunately the patient today has some evidence of deep tissue injury and pressure getting to the ankle region. Again I am not exactly sure what is going on here but this is very similar to issues that we have had in the past. I explained to the patient that she needs to be very mindful of exactly what is happening I think sleeping in bed is probably the main issue here although there could be  other culprits I am not sure what else would potentially lead to this kind of a problem for her. 07/21/2021 upon evaluation today patient's wound actually showing signs of improvement compared to last week. Fortunately there does not appear to be any signs of active infection which is great news and overall very pleased with where things stand in that regard. With that being said I do believe that she is continuing to show signs of overall of getting better although I think this is still basically about what we were 2 weeks ago due to the worsening and now improvement. 07/28/2021 upon evaluation today patient appears to be doing well with regard to her wound. She does have some slough buildup on the surface of the wound which I would have to manage today. Fortunately there is no sign of active infection at this time. No fevers, chills, nausea, vomiting, or diarrhea. 08/04/2021 upon evaluation today patient appears to be doing about the  same in regard to her wound. To be perfectly honest I am beginning to be a little bit concerned about the overall appearance of the wound bed. I do think possibly taking a sample right around the margin of the wound could be beneficial for her as far as identifying anything such as an inflammatory process or to be honest even a skin tag cancer type process that may be of concern here. Fortunately there does not appear to be any evidence of active infection at this time which is great news she is not having any pain also great news. 08/11/2021 upon evaluation today patient appears to be doing well with regard to her wound. The good news is I did review her biopsy results and it showed some inflammatory mixed findings but nothing that appeared to be malignant which is great news. Overall this is more of a chronic venous stasis type issue which again is more what we have been treating. Nonetheless I just wanted to make sure before going forward that there was not anything more  untoward going on at this point. 08/18/2021 upon evaluation today patient appears to be doing well with regard to her ankle ulcer. Fortunately there does not appear to be any signs of active infection at this time which is great overall wound is dramatically improved compared to last week. Since last week I have actually placed her on doxycycline and subsequently this is a good option as far as the findings are concerned at this point. 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 the doxycycline is doing that. There does not appear to be any evidence of active infection systemically which is great news. 08/25/2021 upon evaluation today patient appears to be doing well with regard to her wound. I feel like we are finally get back on track as far as healing is concerned I am much happier with the overall appearance today. I do think that she is tolerating the dressing changes without complication which is great news. We have been using Hydrofera Blue which I think is a good option. The good news is she is also doing great in Vonruden, DIONDRA PINES. ([id number]) regard to her compression sock on the left which is a zipper compression that seems to be doing a great job keeping her edema under good control. 09/01/2021 upon evaluation today patient appears to be doing well with regard to her wound. Infection seems to be under much better control which is great news and very pleased in that regard. Fortunately there does not appear to be any signs of infection currently. 09/08/2021 upon evaluation today patient actually appears to be making good progress in regard to her wound. She has been tolerating the dressing changes without complication. Fortunately there does not appear to be any evidence of active infection at this time which is great news as well. No fevers, chills, nausea, vomiting,  or diarrhea. 09/22/2021 upon evaluation today patient appears to be doing well with regard to her wound although is very slow to heal. We have not looked into Apligraf yet I think that is something that we should see about doing. 09/29/2021 upon evaluation today patient's wound is actually showing signs of doing about the same. I am not seeing any evidence of worsening but also no significant evidence of improvement. We did gain approval for the organogenesis products all except for  Apligraf as covered by her insurance. With that being said I do think that we can go ahead and proceed with the NuShield if the patient and her family in agreement of the plan I discussed that with him today she does have a 20% coinsurance which we also discussed. 10/06/2021 upon evaluation today patient appears to unfortunately be doing a little bit worse she appears to be infected based on what I am seeing. Fortunately there does not appear to be any signs of active infection at this time which is great news. Unfortunately it does appear to be some evidence of around the wound edge indicated by way of erythema and warmth as well as redness 10/13/2021 upon evaluation today patient actually appears to be doing excellent in regard to her ankle ulcer compared to what it was. 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 I think were fine to go ahead and apply the NuShield today. 10/20/2021 upon evaluation today patient appears to be doing excellent in fact the NuShield seems to have done all some for her thus far. I am actually very pleased with where we stand and overall I think that she is making great progress. There is no evidence of active infection at this time. 11/23; patient presents for follow-up. She has no issues or  complaints today. She denies signs of infection. She reports stability in her wound healing. 11/03/2021 upon evaluation today patient appears to be doing well with regard to her wounds. She has been tolerating the dressing changes without complication. Fortunately there is no signs of active infection at this time. 11/10/2021 upon evaluation today patient appears to be doing well with regard to her right ankle which is actually showing signs of improvement with the NuShield I am very pleased. Subsequently in regards to the left ankle this appears to be doing okay with no evidence of issue here either. With that being said she has a significant contusion on the left leg from having fallen 2 days ago. She tells me that she was using her walker going to the closet and then when she got to the closet turned around to get something out at which point she fell according to the story. Nonetheless she does have a hematoma just below her knee on the anterior portion of her shin. My hope is that this will not open until wound although we do need I think Some compression over it also think that we need to have her use an ice as well to help with the swelling and prevent this from getting worse. The last thing she needs is a wound opened up here. 11/17/2021 upon evaluation patient's right ankle actually showing signs of improvement based on what I see currently there is a lot of new skin growth coming in which is great news. Nonetheless I do feel like that the patient is showing improvement as well in regard to her left leg with the use of the Tubigrip it swollen but not as bruised as what I would have expected after what I was seeing last week. Nonetheless I do think doubling up on the Tubigrip would probably be beneficial to try to keep some of the edema under better control here. 11/24/2021 upon evaluation today patient appears to be doing excellent in regard to her wound. This  is actually measuring  significantly smaller which is great news. Fortunately I do not see any signs of active infection locally nor systemically at this point. Overall I am very pleased with how the NuShield is doing. 12/30; wound bed looks healthy however not much change in overall wound volume. We applied Nushield again today in the standard fashion 12/08/2021 upon evaluation today patient's wound actually showing signs of good improvement and I am actually very pleased with where we stand today as well. I do not see any evidence of active infection locally nor systemically which is great news. Unfortunately she has been having some issues with stomach upset and diarrhea today. 12/15/2021 upon evaluation today patient appears to be doing excellent in regard to her wound. There is a little bit of dry skin raised up around the edges of the wound but fortunately nothing too significant at this point. Fortunately I do not see any evidence of active infection either which is great news. No fevers, chills, nausea, vomiting, or diarrhea. 12/29/2021 upon evaluation today patient appears to be doing well with regard to her wound. In general I feel like she is getting very close to complete resolution. I think that she is making good progress here as well. Overall I do not see any signs of infection and very little of this appears to actually be open. 01/12/2022 upon evaluation today patient appears to be doing a little bit worse in regard to her wound. It appears that the collagen actually trapped fluid underneath the collagen which got hard and subsequently caused the fluid to back up. This patient has made the wound appear to be larger than what it was previous. With that being said I do not see any signs of obvious infection is not warm to touch and not significantly erythematous either which is good news. Nonetheless I do think that we will need to continue to keep an eye on this. Probably not go put on antibiotic this week but I  will be too far from doing so she is actually on a Z-Pak right now I do not want to really double up on antibiotics. 01/26/2022 upon evaluation today patient's wound is showing signs of improvement although this is slow to turn back around. Fortunately there does not appear to be any evidence of active infection locally or systemically which is great news and overall I am extremely pleased with where we stand today. No fevers, chills, nausea, vomiting, or diarrhea. 02/02/2022 upon evaluation today patient appears to be doing well with regard to her wound. She has been tolerating the dressing changes without complication. Fortunately there does not appear to be any signs of active infection locally nor systemically at this time which is great news. No fevers, chills, nausea, vomiting, or diarrhea. MILKA, WINDHOLZ ([id number]) Objective Constitutional Well-nourished and well-hydrated in no acute distress. Vitals Time Taken: 10:21 AM, Height: 62 in, Weight: 150 lbs, BMI: 27.4, Temperature: 98.3 F, Pulse: 73 bpm, Respiratory Rate: 18 breaths/min, Blood Pressure: 141/76 mmHg. Respiratory normal breathing without difficulty. Psychiatric this patient is able to make decisions and demonstrates good insight into disease process. Alert and Oriented x 3. pleasant and cooperative. General Notes: Patient's wound currently did require sharp debridement clear with some necrotic debris she tolerated this debridement today without complication and postdebridement this appears to be doing significantly better which is great news. Integumentary (Hair, Skin) Wound #7 status is Open. Original cause of wound was Gradually Appeared. The date acquired was: 05/12/2021. The wound has  been in treatment 37 weeks. The wound is located on the Right,Lateral Malleolus. The wound measures 0.9cm length x 1.1cm width x 0.2cm depth; 0.778cm^2 area and 0.156cm^3 volume. There is Fat Layer (Subcutaneous Tissue) exposed. There is no  tunneling or undermining noted. There is a medium amount of serosanguineous drainage noted. There is medium (34-66%) red granulation within the wound bed. There is a medium (34-66%) amount of necrotic tissue within the wound bed including Adherent Slough. Assessment Active Problems ICD-10 Pressure ulcer of right ankle, stage 3 Lymphedema, not elsewhere classified Venous insufficiency (chronic) (peripheral) Presence of cardiac pacemaker Long term (current) use of anticoagulants Procedures Wound #7 Pre-procedure diagnosis of Wound #7 is a Pressure Ulcer located on the Right,Lateral Malleolus . There was a Excisional Skin/Subcutaneous Tissue Debridement with a total area of 0.99 sq cm performed by Tommie Sams., PA-C. With the following instrument(s): Curette to remove Viable and Non-Viable tissue/material. Material removed includes Callus, Subcutaneous Tissue, Slough, Skin: Dermis, Skin: Epidermis, and Biofilm after achieving pain control using Lidocaine 4% Topical Solution. No specimens were taken. A time out was conducted at 10:40, prior to the start of the procedure. A Minimum amount of bleeding was controlled with Pressure. The procedure was tolerated well with a pain level of 0 throughout and a pain level of 0 following the procedure. Post Debridement Measurements: 0.9cm length x 1.1cm width x 0.2cm depth; 0.156cm^3 volume. Post debridement Stage noted as Category/Stage III. Character of Wound/Ulcer Post Debridement is improved. Post procedure Diagnosis Wound #7: Same as Pre-Procedure Plan EVANGELINE, UTLEY ([id number]) Follow-up Appointments: Return Appointment in 2 weeks. Nurse Visit as needed - 1 week Bathing/ Shower/ Hygiene: May shower with wound dressing protected with water repellent cover or cast protector. Edema Control - Lymphedema / Segmental Compressive Device / Other: Optional: One layer of unna paste to top of compression wrap (to act as an anchor). - and at  toes Patient to wear own compression stockings. Remove compression stockings every night before going to bed and put on every morning when getting up. - left leg Elevate, Exercise Daily and Avoid Standing for Long Periods of Time. Elevate legs to the level of the heart and pump ankles as often as possible Elevate leg(s) parallel to the floor when sitting. WOUND #7: - Malleolus Wound Laterality: Right, Lateral Cleanser: Soap and Water 1 x Per Week/30 Days Discharge Instructions: Gently cleanse wound with antibacterial soap, rinse and pat dry prior to dressing wounds Primary Dressing: Algicell Calcium Alginate Dressing 2x2 (in/in) 1 x Per Week/30 Days Primary Dressing: Medihoney - Gel, 1.5 (oz), tube 1 x Per Week/30 Days Secondary Dressing: Gauze 1 x Per Week/30 Days Discharge Instructions: As directed: dry, moistened with saline or moistened with Dakins Solution Compression Wrap: Profore Lite LF 3 Multilayer Compression Bandaging System 1 x Per Week/30 Days Discharge Instructions: Apply 3 multi-layer wrap as prescribed. 1. I would recommend currently based on what we are seeing that we go ahead and continue with the wound care measures as before with regard to the Medihoney and alginate which I think is doing a good job. 2. I am also can recommend that we have the patient continue to monitor for any signs of worsening or infection. Obviously if anything changes she should let me know as soon as possible. That would be things such as increased pain but hopefully that will not be the case. We will see patient back for reevaluation in 2 weeks here in the clinic. If anything worsens or changes  patient will contact our office for additional recommendations. This will be a provider visit with me in 1 week we will see her for a nurse visit. Electronic Signature(s) Signed: 02/02/2022 2:37:39 PM By: Worthy Keeler PA-C Entered By: Worthy Keeler on 02/02/2022 14:37:39 Milbourn, [name]  ([id number]) -------------------------------------------------------------------------------- SuperBill Details Patient Name: [name] Date of Service: 02/02/2022 Medical Record Number: [id number] Patient Account Number: [id number] Date of Birth/Sex: 02-24-1923 (88 y.o. F) Treating RN: Carlene Coria Primary Care Provider: Adrian Prows Other Clinician: Referring Provider: Adrian Prows Treating Provider/Extender: Skipper Cliche in Treatment: 37 Diagnosis Coding ICD-10 Codes Code Description [phone] Pressure ulcer of right ankle, stage 3 I89.0 Lymphedema, not elsewhere classified I87.2 Venous insufficiency (chronic) (peripheral) Z95.0 Presence of cardiac pacemaker Z79.01 Long term (current) use of anticoagulants Facility Procedures CPT4 Code: 21308657 Description: 84696 - DEB SUBQ TISSUE 20 SQ CM/< Modifier: Quantity: 1 CPT4 Code: Description: ICD-10 Diagnosis Description 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, stage 3 Modifier: Quantity: Physician Procedures CPT4 Code: 2952841 Description: 32440 - WC PHYS SUBQ TISS 20 SQ CM Modifier: Quantity: 1 CPT4 Code: Description: ICD-10 Diagnosis Description 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, stage 3 Modifier: Quantity: Electronic Signature(s) Signed: 02/02/2022 2:37:51 PM By: Worthy Keeler PA-C Entered By: Worthy Keeler on 02/02/2022 14:37:51

## 2022-02-03 NOTE — Progress Notes (Signed)
TEENA, MANGUS ([id number]) Visit Report for 02/02/2022 Arrival Information Details Patient Name: [name], [name]. Date of Service: 02/02/2022 10:15 AM Medical Record Number: [id number] Patient Account Number: [id number] Date of Birth/Sex: [birth year] (88 y.o. F) Treating RN: Carlene Coria Primary Care Ixchel Duck: Adrian Prows Other Clinician: Referring Joliana Claflin: Adrian Prows Treating Mikia Delaluz/Extender: Skipper Cliche in Treatment: 65 Visit Information History Since Last Visit All ordered tests and consults were completed: No Patient Arrived: [name] Added or deleted any medications: No Arrival Time: 10:15 Any new allergies or adverse reactions: No Accompanied By: daughter Had a fall or experienced change in No Transfer Assistance: None activities of daily living that may affect Patient Identification Verified: Yes risk of falls: Secondary Verification Process Completed: Yes Signs or symptoms of abuse/neglect since last visito No Patient Requires Transmission-Based No Hospitalized since last visit: No Precautions: Implantable device outside of the clinic excluding No Patient Has Alerts: Yes cellular tissue based products placed in the center Patient Alerts: Patient on Blood since last visit: Thinner Has Dressing in Place as Prescribed: Yes NOT DIABETIC Has Compression in Place as Prescribed: Yes ***ELIQUIS*** Pain Present Now: No Electronic Signature(s) Signed: 02/03/2022 1:46:31 PM By: Carlene Coria RN Entered By: Carlene Coria on 02/02/2022 10:21:01 Blackie, [name] ([id number]) -------------------------------------------------------------------------------- Clinic Level of Care Assessment Details Patient Name: [name] Date of Service: 02/02/2022 10:15 AM Medical Record Number: [id number] Patient Account Number: [id number] Date of Birth/Sex: 19-Aug-1923 (88 y.o. F) Treating RN: Carlene Coria Primary Care Adaijah Endres: Adrian Prows Other  Clinician: Referring Hobie Kohles: Adrian Prows Treating Chalese Peach/Extender: Skipper Cliche in Treatment: 37 Clinic Level of Care Assessment Items TOOL 1 Quantity Score []  - Use when EandM and Procedure is performed on INITIAL visit 0 ASSESSMENTS - Nursing Assessment / Reassessment []  - General Physical Exam (combine w/ comprehensive assessment (listed just below) when performed on new 0 pt. evals) []  - 0 Comprehensive Assessment (HX, ROS, Risk Assessments, Wounds Hx, etc.) ASSESSMENTS - Wound and Skin Assessment / Reassessment []  - Dermatologic / Skin Assessment (not related to wound area) 0 ASSESSMENTS - Ostomy and/or Continence Assessment and Care []  - Incontinence Assessment and Management 0 []  - 0 Ostomy Care Assessment and Management (repouching, etc.) PROCESS - Coordination of Care []  - Simple Patient / Family Education for ongoing care 0 []  - 0 Complex (extensive) Patient / Family Education for ongoing care []  - 0 Staff obtains Programmer, systems, Records, Test Results / Process Orders []  - 0 Staff telephones HHA, Nursing Homes / Clarify orders / etc []  - 0 Routine Transfer to another Facility (non-emergent condition) []  - 0 Routine Hospital Admission (non-emergent condition) []  - 0 New Admissions / Biomedical engineer / Ordering NPWT, Apligraf, etc. []  - 0 Emergency Hospital Admission (emergent condition) PROCESS - Special Needs []  - Pediatric / Minor Patient Management 0 []  - 0 Isolation Patient Management []  - 0 Hearing / Language / Visual special needs []  - 0 Assessment of Community assistance (transportation, D/C planning, etc.) []  - 0 Additional assistance / Altered mentation []  - 0 Support Surface(s) Assessment (bed, cushion, seat, etc.) INTERVENTIONS - Miscellaneous []  - External ear exam 0 []  - 0 Patient Transfer (multiple staff / Civil Service fast streamer / Similar devices) []  - 0 Simple Staple / Suture removal (25 or less) []  - 0 Complex Staple / Suture removal  (26 or more) []  - 0 Hypo/Hyperglycemic Management (do not check if billed separately) []  - 0 Ankle / Brachial Index (ABI) - do not check if billed separately Has the  patient been seen at the hospital within the last three years: Yes Total Score: 0 Level Of Care: ____ [name] ([id number]) Electronic Signature(s) Signed: 02/03/2022 1:46:31 PM By: Carlene Coria RN Entered By: Carlene Coria on 02/02/2022 10:45:22 Lambe, [name] ([id number]) -------------------------------------------------------------------------------- Encounter Discharge Information Details Patient Name: [name] Date of Service: 02/02/2022 10:15 AM Medical Record Number: [id number] Patient Account Number: [id number] Date of Birth/Sex: [birth year] (88 y.o. F) Treating RN: Carlene Coria Primary Care Early Steel: Adrian Prows Other Clinician: Referring Nealy Karapetian: Adrian Prows Treating Branna Cortina/Extender: Skipper Cliche in Treatment: 37 Encounter Discharge Information Items Post Procedure Vitals Discharge Condition: Stable Temperature (F): 98.3 Ambulatory Status: Walker Pulse (bpm): 73 Discharge Destination: Home Respiratory Rate (breaths/min): 18 Transportation: Private Auto Blood Pressure (mmHg): 141/76 Accompanied By: self Schedule Follow-up Appointment: Yes Clinical Summary of Care: Patient Declined Electronic Signature(s) Signed: 02/03/2022 1:46:31 PM By: Carlene Coria RN Entered By: Carlene Coria on 02/02/2022 10:59:23 Marcum, [name] ([id number]) -------------------------------------------------------------------------------- Lower Extremity Assessment Details Patient Name: [name] Date of Service: 02/02/2022 10:15 AM Medical Record Number: [id number] Patient Account Number: [id number] Date of Birth/Sex: [birth year] (88 y.o. F) Treating RN: Carlene Coria Primary Care Julias Mould: Adrian Prows Other Clinician: Referring Gibbs Naugle: Adrian Prows Treating  Cleora Karnik/Extender: Skipper Cliche in Treatment: 37 Edema Assessment Assessed: [Left: No] [Right: No] Edema: [Left: Ye] [Right: s] Calf Left: Right: Point of Measurement: 33 cm From Medial Instep 26 cm Ankle Left: Right: Point of Measurement: 10 cm From Medial Instep 17 cm Vascular Assessment Pulses: Dorsalis Pedis Palpable: [Right:Yes] Electronic Signature(s) Signed: 02/03/2022 1:46:31 PM By: Carlene Coria RN Entered By: Carlene Coria on 02/02/2022 10:31:21 Grammatico, [name] ([id number]) -------------------------------------------------------------------------------- Multi Wound Chart Details Patient Name: [name] Date of Service: 02/02/2022 10:15 AM Medical Record Number: [id number] Patient Account Number: [id number] Date of Birth/Sex: November 24, 1923 (88 y.o. F) Treating RN: Carlene Coria Primary Care Ariz Terrones: Adrian Prows Other Clinician: Referring Keshonna Valvo: Adrian Prows Treating Denay Pleitez/Extender: Skipper Cliche in Treatment: 37 Vital Signs Height(in): 62 Pulse(bpm): 25 Weight(lbs): 150 Blood Pressure(mmHg): 141/76 Body Mass Index(BMI): 27.4 Temperature(F): 98.3 Respiratory Rate(breaths/min): 18 Photos: [N/A:N/A] Wound Location: Right, Lateral Malleolus N/A N/A Wounding Event: Gradually Appeared N/A N/A Primary Etiology: Pressure Ulcer N/A N/A Comorbid History: Cataracts, Congestive Heart Failure, N/A N/A Hypertension, Peripheral Arterial Disease, Osteoarthritis, Neuropathy Date Acquired: 05/12/2021 N/A N/A Weeks of Treatment: 37 N/A N/A Wound Status: Open N/A N/A Wound Recurrence: No N/A N/A Measurements L x W x D (cm) 0.9x1.1x0.2 N/A N/A Area (cm) : 0.778 N/A N/A Volume (cm) : 0.156 N/A N/A % Reduction in Area: 30.70% N/A N/A % Reduction in Volume: 30.70% N/A N/A Classification: Category/Stage III N/A N/A Exudate Amount: Medium N/A N/A Exudate Type: Serosanguineous N/A N/A Exudate Color: red, brown N/A N/A Granulation Amount: Medium  (34-66%) N/A N/A Granulation Quality: Red N/A N/A Necrotic Amount: Medium (34-66%) N/A N/A Exposed Structures: Fat Layer (Subcutaneous Tissue): N/A N/A Yes Fascia: No Tendon: No Muscle: No Joint: No Bone: No Epithelialization: None N/A N/A Treatment Notes Electronic Signature(s) Signed: 02/03/2022 1:46:31 PM By: Carlene Coria RN Entered By: Carlene Coria on 02/02/2022 10:31:43 Schrodt, [name] ([id number]) -------------------------------------------------------------------------------- Multi-Disciplinary Care Plan Details Patient Name: [name] Date of Service: 02/02/2022 10:15 AM Medical Record Number: [id number] Patient Account Number: [id number] Date of Birth/Sex: 03-Feb-1923 (88 y.o. F) Treating RN: Carlene Coria Primary Care Courney Garrod: Adrian Prows Other Clinician: Referring Izayah Miner: Adrian Prows Treating Sharniece Gibbon/Extender: Skipper Cliche in Treatment: 37 Active Inactive Electronic Signature(s) Signed: 02/03/2022 1:46:31 PM By:  Carlene Coria RN Entered By: Carlene Coria on 02/02/2022 10:31:31 Shingledecker, [name] ([id number]) -------------------------------------------------------------------------------- Pain Assessment Details Patient Name: [name], [name] Date of Service: 02/02/2022 10:15 AM Medical Record Number: [id number] Patient Account Number: [id number] Date of Birth/Sex: 1923-03-07 (88 y.o. F) Treating RN: Carlene Coria Primary Care Kemari Narez: Adrian Prows Other Clinician: Referring Quetzaly Ebner: Adrian Prows Treating Tanisia Yokley/Extender: Skipper Cliche in Treatment: 37 Active Problems Location of Pain Severity and Description of Pain Patient Has Paino No Site Locations Pain Management and Medication Current Pain Management: Electronic Signature(s) Signed: 02/03/2022 1:46:31 PM By: Carlene Coria RN Entered By: Carlene Coria on 02/02/2022 10:21:35 Goswami, [name]  ([id number]) -------------------------------------------------------------------------------- Patient/Caregiver Education Details Patient Name: [name] Date of Service: 02/02/2022 10:15 AM Medical Record Number: [id number] Patient Account Number: [id number] Date of Birth/Gender: 1923/08/20 (88 y.o. F) Treating RN: Carlene Coria Primary Care Physician: Adrian Prows Other Clinician: Referring Physician: Adrian Prows Treating Physician/Extender: Skipper Cliche in Treatment: 45 Education Assessment Education Provided To: Patient Education Topics Provided Wound/Skin Impairment: Methods: Explain/Verbal Responses: State content correctly Electronic Signature(s) Signed: 02/03/2022 1:46:31 PM By: Carlene Coria RN Entered By: Carlene Coria on 02/02/2022 10:45:59 Lipkin, [name] ([id number]) -------------------------------------------------------------------------------- Wound Assessment Details Patient Name: [name] Date of Service: 02/02/2022 10:15 AM Medical Record Number: [id number] Patient Account Number: [id number] Date of Birth/Sex: 1923-04-20 (88 y.o. F) Treating RN: Carlene Coria Primary Care Icess Bertoni: Adrian Prows Other Clinician: Referring Carlea Badour: Adrian Prows Treating Marquitta Persichetti/Extender: Skipper Cliche in Treatment: 37 Wound Status Wound Number: 7 Primary Pressure Ulcer Etiology: Wound Location: Right, Lateral Malleolus Wound Open Wounding Event: Gradually Appeared Status: Date Acquired: 05/12/2021 Comorbid Cataracts, Congestive Heart Failure, Hypertension, Weeks Of Treatment: 37 History: Peripheral Arterial Disease, Osteoarthritis, Neuropathy Clustered Wound: No Photos Wound Measurements Length: (cm) 0.9 Width: (cm) 1.1 Depth: (cm) 0.2 Area: (cm) 0.778 Volume: (cm) 0.156 % Reduction in Area: 30.7% % Reduction in Volume: 30.7% Epithelialization: None Tunneling: No Undermining: No Wound Description Classification:  Category/Stage III Exudate Amount: Medium Exudate Type: Serosanguineous Exudate Color: red, brown Foul Odor After Cleansing: No Slough/Fibrino Yes Wound Bed Granulation Amount: Medium (34-66%) Exposed Structure Granulation Quality: Red Fascia Exposed: No Necrotic Amount: Medium (34-66%) Fat Layer (Subcutaneous Tissue) Exposed: Yes Necrotic Quality: Adherent Slough Tendon Exposed: No Muscle Exposed: No Joint Exposed: No Bone Exposed: No Treatment Notes Wound #7 (Malleolus) Wound Laterality: Right, Lateral Cleanser Soap and Water Discharge Instruction: Gently cleanse wound with antibacterial soap, rinse and pat dry prior to dressing wounds Peri-Wound Care [place], [name] ([id number]) Topical Primary Dressing Algicell Calcium Alginate Dressing 2x2 (in/in) Medihoney - Gel, 1.5 (oz), tube Secondary Dressing Gauze Discharge Instruction: As directed: dry, moistened with saline or moistened with Dakins Solution Secured With Compression Wrap Profore Lite LF 3 Multilayer Compression Bandaging System Discharge Instruction: Apply 3 multi-layer wrap as prescribed. Compression Stockings Add-Ons Electronic Signature(s) Signed: 02/03/2022 1:46:31 PM By: Carlene Coria RN Entered By: Carlene Coria on 02/02/2022 10:30:25 Swider, [name] ([id number]) -------------------------------------------------------------------------------- Vitals Details Patient Name: [name] Date of Service: 02/02/2022 10:15 AM Medical Record Number: [id number] Patient Account Number: [id number] Date of Birth/Sex: 1923-03-17 (88 y.o. F) Treating RN: Carlene Coria Primary Care Izzah Pasqua: Adrian Prows Other Clinician: Referring Amahd Morino: Adrian Prows Treating Aeriel Boulay/Extender: Skipper Cliche in Treatment: 37 Vital Signs Time Taken: 10:21 Temperature (F): 98.3 Height (in): 62 Pulse (bpm): 73 Weight (lbs): 150 Respiratory Rate (breaths/min): 18 Body Mass Index (BMI): 27.4 Blood  Pressure (mmHg): 141/76 Reference Range: 80 - 120 mg / dl Electronic Signature(s) Signed:  02/03/2022 1:46:31 PM By: Carlene Coria RN Entered By: Carlene Coria on 02/02/2022 10:21:22

## 2022-02-09 ENCOUNTER — Encounter (HOSPITAL_BASED_OUTPATIENT_CLINIC_OR_DEPARTMENT_OTHER): Payer: Medicare Other | Admitting: Internal Medicine

## 2022-02-09 ENCOUNTER — Other Ambulatory Visit: Payer: Self-pay

## 2022-02-09 DIAGNOSIS — I872 Venous insufficiency (chronic) (peripheral): Secondary | ICD-10-CM

## 2022-02-09 DIAGNOSIS — I890 Lymphedema, not elsewhere classified: Secondary | ICD-10-CM | POA: Diagnosis not present

## 2022-02-09 DIAGNOSIS — L89513 Pressure ulcer of right ankle, stage 3: Secondary | ICD-10-CM | POA: Diagnosis not present

## 2022-02-13 NOTE — Progress Notes (Signed)
COSIMA, PRENTISS ([id number]) Visit Report for 02/09/2022 Arrival Information Details Patient Name: [name], [name] Date of Service: 02/09/2022 10:15 AM Medical Record Number: [id number] Patient Account Number: [id number] Date of Birth/Sex: 1923-10-20 (88 y.o. F) Treating RN: Carlene Coria Primary Care Rheba Diamond: Adrian Prows Other Clinician: Referring Alyjah Lovingood: Adrian Prows Treating Kylan Veach/Extender: Yaakov Guthrie in Treatment: 79 Visit Information History Since Last Visit All ordered tests and consults were completed: No Patient Arrived: [name] Added or deleted any medications: No Arrival Time: 10:14 Any new allergies or adverse reactions: No Accompanied By: daughter Had a fall or experienced change in No Transfer Assistance: None activities of daily living that may affect Patient Identification Verified: Yes risk of falls: Secondary Verification Process Completed: Yes Signs or symptoms of abuse/neglect since last visito No Patient Requires Transmission-Based No Hospitalized since last visit: No Precautions: Implantable device outside of the clinic excluding No Patient Has Alerts: Yes cellular tissue based products placed in the center Patient Alerts: Patient on Blood since last visit: Thinner Has Dressing in Place as Prescribed: Yes NOT DIABETIC Has Compression in Place as Prescribed: Yes ***ELIQUIS*** Pain Present Now: No Electronic Signature(s) Signed: 02/13/2022 4:21:53 PM By: Carlene Coria RN Entered By: Carlene Coria on 02/09/2022 10:21:15 Snowden, [name] ([id number]) -------------------------------------------------------------------------------- Clinic Level of Care Assessment Details Patient Name: [name] Date of Service: 02/09/2022 10:15 AM Medical Record Number: [id number] Patient Account Number: [id number] Date of Birth/Sex: Mar 28, 1923 (88 y.o. F) Treating RN: Carlene Coria Primary Care Andreia Gandolfi: Adrian Prows Other  Clinician: Referring Toi Stelly: Adrian Prows Treating Stacy Deshler/Extender: Yaakov Guthrie in Treatment: 33 Clinic Level of Care Assessment Items TOOL 1 Quantity Score '[]'$  - Use when EandM and Procedure is performed on INITIAL visit 0 ASSESSMENTS - Nursing Assessment / Reassessment '[]'$  - General Physical Exam (combine w/ comprehensive assessment (listed just below) when performed on new 0 pt. evals) '[]'$  - 0 Comprehensive Assessment (HX, ROS, Risk Assessments, Wounds Hx, etc.) ASSESSMENTS - Wound and Skin Assessment / Reassessment '[]'$  - Dermatologic / Skin Assessment (not related to wound area) 0 ASSESSMENTS - Ostomy and/or Continence Assessment and Care '[]'$  - Incontinence Assessment and Management 0 '[]'$  - 0 Ostomy Care Assessment and Management (repouching, etc.) PROCESS - Coordination of Care '[]'$  - Simple Patient / Family Education for ongoing care 0 '[]'$  - 0 Complex (extensive) Patient / Family Education for ongoing care '[]'$  - 0 Staff obtains Programmer, systems, Records, Test Results / Process Orders '[]'$  - 0 Staff telephones HHA, Nursing Homes / Clarify orders / etc '[]'$  - 0 Routine Transfer to another Facility (non-emergent condition) '[]'$  - 0 Routine Hospital Admission (non-emergent condition) '[]'$  - 0 New Admissions / Biomedical engineer / Ordering NPWT, Apligraf, etc. '[]'$  - 0 Emergency Hospital Admission (emergent condition) PROCESS - Special Needs '[]'$  - Pediatric / Minor Patient Management 0 '[]'$  - 0 Isolation Patient Management '[]'$  - 0 Hearing / Language / Visual special needs '[]'$  - 0 Assessment of Community assistance (transportation, D/C planning, etc.) '[]'$  - 0 Additional assistance / Altered mentation '[]'$  - 0 Support Surface(s) Assessment (bed, cushion, seat, etc.) INTERVENTIONS - Miscellaneous '[]'$  - External ear exam 0 '[]'$  - 0 Patient Transfer (multiple staff / Civil Service fast streamer / Similar devices) '[]'$  - 0 Simple Staple / Suture removal (25 or less) '[]'$  - 0 Complex Staple / Suture  removal (26 or more) '[]'$  - 0 Hypo/Hyperglycemic Management (do not check if billed separately) '[]'$  - 0 Ankle / Brachial Index (ABI) - do not check if billed separately Has the  patient been seen at the hospital within the last three years: Yes Total Score: 0 Level Of Care: ____ [name] ([id number]) Electronic Signature(s) Signed: 02/13/2022 4:21:53 PM By: Carlene Coria RN Entered By: Carlene Coria on 02/09/2022 10:50:10 Shreve, [name] ([id number]) -------------------------------------------------------------------------------- Encounter Discharge Information Details Patient Name: [name] Date of Service: 02/09/2022 10:15 AM Medical Record Number: [id number] Patient Account Number: [id number] Date of Birth/Sex: 01-08-1923 (88 y.o. F) Treating RN: Carlene Coria Primary Care Eloy Fehl: Adrian Prows Other Clinician: Referring Parker Sawatzky: Adrian Prows Treating Joleigh Mineau/Extender: Yaakov Guthrie in Treatment: 51 Encounter Discharge Information Items Post Procedure Vitals Discharge Condition: Stable Temperature (F): 98.2 Ambulatory Status: Wheelchair Pulse (bpm): 80 Discharge Destination: Home Respiratory Rate (breaths/min): 18 Transportation: Private Auto Blood Pressure (mmHg): 150/78 Accompanied By: self Schedule Follow-up Appointment: Yes Clinical Summary of Care: Patient Declined Electronic Signature(s) Signed: 02/13/2022 4:21:53 PM By: Carlene Coria RN Entered By: Carlene Coria on 02/09/2022 11:08:14 Macphail, [name] ([id number]) -------------------------------------------------------------------------------- Lower Extremity Assessment Details Patient Name: [name] Date of Service: 02/09/2022 10:15 AM Medical Record Number: [id number] Patient Account Number: [id number] Date of Birth/Sex: [birth year] (88 y.o. F) Treating RN: Carlene Coria Primary Care Shalin Vonbargen: Adrian Prows Other Clinician: Referring Shin Lamour: Adrian Prows Treating Unita Detamore/Extender: Yaakov Guthrie in Treatment: 38 Edema Assessment Assessed: [Left: No] Patrice Paradise: No] Edema: [Left: Ye] [Right: s] Calf Left: Right: Point of Measurement: 33 cm From Medial Instep 26 cm Ankle Left: Right: Point of Measurement: 10 cm From Medial Instep 17 cm Vascular Assessment Pulses: Dorsalis Pedis Palpable: [Right:Yes] Electronic Signature(s) Signed: 02/13/2022 4:21:53 PM By: Carlene Coria RN Entered By: Carlene Coria on 02/09/2022 10:33:40 [place], [name] ([id number]) -------------------------------------------------------------------------------- Multi Wound Chart Details Patient Name: [name] Date of Service: 02/09/2022 10:15 AM Medical Record Number: [id number] Patient Account Number: [id number] Date of Birth/Sex: 15-Jul-1923 (88 y.o. F) Treating RN: Carlene Coria Primary Care Jonluke Cobbins: Adrian Prows Other Clinician: Referring Christean Silvestri: Adrian Prows Treating Ayven Pheasant/Extender: Yaakov Guthrie in Treatment: 28 Vital Signs Height(in): 62 Pulse(bpm): 80 Weight(lbs): 150 Blood Pressure(mmHg): 150/78 Body Mass Index(BMI): 27.4 Temperature(F): 98.2 Respiratory Rate(breaths/min): 18 Photos: [N/A:N/A] Wound Location: Right, Lateral Malleolus N/A N/A Wounding Event: Gradually Appeared N/A N/A Primary Etiology: Pressure Ulcer N/A N/A Comorbid History: Cataracts, Congestive Heart Failure, N/A N/A Hypertension, Peripheral Arterial Disease, Osteoarthritis, Neuropathy Date Acquired: 05/12/2021 N/A N/A Weeks of Treatment: 38 N/A N/A Wound Status: Open N/A N/A Wound Recurrence: No N/A N/A Measurements L x W x D (cm) 1x1.2x0.2 N/A N/A Area (cm) : 0.942 N/A N/A Volume (cm) : 0.188 N/A N/A % Reduction in Area: 16.10% N/A N/A % Reduction in Volume: 16.40% N/A N/A Classification: Category/Stage III N/A N/A Exudate Amount: Medium N/A N/A Exudate Type: Serosanguineous N/A N/A Exudate Color: red, brown N/A  N/A Granulation Amount: Medium (34-66%) N/A N/A Granulation Quality: Red N/A N/A Necrotic Amount: Medium (34-66%) N/A N/A Exposed Structures: Fat Layer (Subcutaneous Tissue): N/A N/A Yes Fascia: No Tendon: No Muscle: No Joint: No Bone: No Epithelialization: None N/A N/A Treatment Notes Electronic Signature(s) Signed: 02/13/2022 4:21:53 PM By: Carlene Coria RN Entered By: Carlene Coria on 02/09/2022 10:34:14 Aylward, [name] ([id number]) -------------------------------------------------------------------------------- Multi-Disciplinary Care Plan Details Patient Name: [name] Date of Service: 02/09/2022 10:15 AM Medical Record Number: [id number] Patient Account Number: [id number] Date of Birth/Sex: 30-Oct-1923 (88 y.o. F) Treating RN: Carlene Coria Primary Care Maelynn Moroney: Adrian Prows Other Clinician: Referring Novis League: Adrian Prows Treating Lequisha Cammack/Extender: Yaakov Guthrie in Treatment: 2 Active Inactive Electronic Signature(s) Signed: 02/13/2022 4:21:53 PM By:  Carlene Coria RN Entered By: Carlene Coria on 02/09/2022 10:33:54 Cabriales, [name] ([id number]) -------------------------------------------------------------------------------- Pain Assessment Details Patient Name: [name], [name] Date of Service: 02/09/2022 10:15 AM Medical Record Number: [id number] Patient Account Number: [id number] Date of Birth/Sex: 04-12-1923 (88 y.o. F) Treating RN: Carlene Coria Primary Care Anyia Gierke: Adrian Prows Other Clinician: Referring Madyn Ivins: Adrian Prows Treating Primitivo Merkey/Extender: Yaakov Guthrie in Treatment: 2 Active Problems Location of Pain Severity and Description of Pain Patient Has Paino No Site Locations Pain Management and Medication Current Pain Management: Electronic Signature(s) Signed: 02/13/2022 4:21:53 PM By: Carlene Coria RN Entered By: Carlene Coria on 02/09/2022 10:22:01 [name]  ([id number]) -------------------------------------------------------------------------------- Patient/Caregiver Education Details Patient Name: [name] Date of Service: 02/09/2022 10:15 AM Medical Record Number: [id number] Patient Account Number: [id number] Date of Birth/Gender: 01/27/1923 (88 y.o. F) Treating RN: Carlene Coria Primary Care Physician: Adrian Prows Other Clinician: Referring Physician: Adrian Prows Treating Physician/Extender: Yaakov Guthrie in Treatment: 70 Education Assessment Education Provided To: Patient Education Topics Provided Wound/Skin Impairment: Methods: Explain/Verbal Responses: State content correctly Electronic Signature(s) Signed: 02/13/2022 4:21:53 PM By: Carlene Coria RN Entered By: Carlene Coria on 02/09/2022 10:50:35 [place], [name] ([id number]) -------------------------------------------------------------------------------- Wound Assessment Details Patient Name: [name] Date of Service: 02/09/2022 10:15 AM Medical Record Number: [id number] Patient Account Number: [id number] Date of Birth/Sex: [birth year] (88 y.o. F) Treating RN: Carlene Coria Primary Care Lorea Kupfer: Adrian Prows Other Clinician: Referring Gordon Carlson: Adrian Prows Treating Lateia Fraser/Extender: Yaakov Guthrie in Treatment: 39 Wound Status Wound Number: 7 Primary Pressure Ulcer Etiology: Wound Location: Right, Lateral Malleolus Wound Open Wounding Event: Gradually Appeared Status: Date Acquired: 05/12/2021 Comorbid Cataracts, Congestive Heart Failure, Hypertension, Weeks Of Treatment: 38 History: Peripheral Arterial Disease, Osteoarthritis, Neuropathy Clustered Wound: No Photos Wound Measurements Length: (cm) 1 Width: (cm) 1.2 Depth: (cm) 0.2 Area: (cm) 0.942 Volume: (cm) 0.188 % Reduction in Area: 16.1% % Reduction in Volume: 16.4% Epithelialization: None Tunneling: No Undermining: No Wound  Description Classification: Category/Stage III Exudate Amount: Medium Exudate Type: Serosanguineous Exudate Color: red, brown Foul Odor After Cleansing: No Slough/Fibrino Yes Wound Bed Granulation Amount: Medium (34-66%) Exposed Structure Granulation Quality: Red Fascia Exposed: No Necrotic Amount: Medium (34-66%) Fat Layer (Subcutaneous Tissue) Exposed: Yes Necrotic Quality: Adherent Slough Tendon Exposed: No Muscle Exposed: No Joint Exposed: No Bone Exposed: No Treatment Notes Wound #7 (Malleolus) Wound Laterality: Right, Lateral Cleanser Soap and Water Discharge Instruction: Gently cleanse wound with antibacterial soap, rinse and pat dry prior to dressing wounds Peri-Wound Care Donath, [name] ([id number]) Topical Primary Dressing Algicell Calcium Alginate Dressing 2x2 (in/in) Medihoney - Gel, 1.5 (oz), tube Secondary Dressing Gauze Discharge Instruction: As directed: dry, moistened with saline or moistened with Dakins Solution Secured With Compression Wrap 3-LAYER WRAP - Profore Lite LF 3 Multilayer Compression Bandaging System Discharge Instruction: Apply 3 multi-layer wrap as prescribed. Compression Stockings Add-Ons Electronic Signature(s) Signed: 02/13/2022 4:21:53 PM By: Carlene Coria RN Entered By: Carlene Coria on 02/09/2022 10:31:10 Niehoff, [name] ([id number]) -------------------------------------------------------------------------------- Vitals Details Patient Name: [name] Date of Service: 02/09/2022 10:15 AM Medical Record Number: [id number] Patient Account Number: [id number] Date of Birth/Sex: March 02, 1923 (88 y.o. F) Treating RN: Carlene Coria Primary Care Diondre Pulis: Adrian Prows Other Clinician: Referring Beecher Furio: Adrian Prows Treating Graziella Connery/Extender: Yaakov Guthrie in Treatment: 32 Vital Signs Time Taken: 10:21 Temperature (F): 98.2 Height (in): 62 Pulse (bpm): 80 Weight (lbs): 150 Respiratory Rate  (breaths/min): 18 Body Mass Index (BMI): 27.4 Blood Pressure (mmHg): 150/78 Reference Range: 80 - 120 mg / dl  Electronic Signature(s) Signed: 02/13/2022 4:21:53 PM By: Carlene Coria RN Entered By: Carlene Coria on 02/09/2022 10:21:47

## 2022-02-13 NOTE — Progress Notes (Signed)
DANICIA, TERHAAR ([id number]) Visit Report for 02/09/2022 Chief Complaint Document Details Patient Name: [name], WICKLIFFE. Date of Service: 02/09/2022 10:15 AM Medical Record Number: [id number] Patient Account Number: [id number] Date of Birth/Sex: 18-Nov-1923 (88 y.o. F) Treating RN: Carlene Coria Primary Care Provider: Adrian Prows Other Clinician: Referring Provider: Adrian Prows Treating Provider/Extender: Yaakov Guthrie in Treatment: 34 Information Obtained from: Patient Chief Complaint Right foot ulcer Electronic Signature(s) Signed: 02/09/2022 10:54:48 AM By: Kalman Shan DO Entered By: Kalman Shan on 02/09/2022 10:50:45 Prazak, [name] ([id number]) -------------------------------------------------------------------------------- Debridement Details Patient Name: [name] Date of Service: 02/09/2022 10:15 AM Medical Record Number: [id number] Patient Account Number: [id number] Date of Birth/Sex: 1923-08-06 (88 y.o. F) Treating RN: Carlene Coria Primary Care Provider: Adrian Prows Other Clinician: Referring Provider: Adrian Prows Treating Provider/Extender: Yaakov Guthrie in Treatment: 38 Debridement Performed for Wound #7 Right,Lateral Malleolus Assessment: Performed By: Physician Kalman Shan, MD Debridement Type: Debridement Level of Consciousness (Pre- Awake and Alert procedure): Pre-procedure Verification/Time Out Yes - 10:45 Taken: Start Time: 10:45 Total Area Debrided (L x W): 1 (cm) x 1.2 (cm) = 1.2 (cm) Tissue and other material Viable, Non-Viable, Slough, Subcutaneous, Slough debrided: Level: Skin/Subcutaneous Tissue Debridement Description: Excisional Instrument: Curette Bleeding: Moderate Hemostasis Achieved: Pressure End Time: 10:49 Procedural Pain: 0 Post Procedural Pain: 0 Response to Treatment: Procedure was tolerated well Level of Consciousness (Post- Awake and Alert procedure): Post  Debridement Measurements of Total Wound Length: (cm) 1 Stage: Category/Stage III Width: (cm) 1.2 Depth: (cm) 0.2 Volume: (cm) 0.188 Character of Wound/Ulcer Post Debridement: Improved Post Procedure Diagnosis Same as Pre-procedure Electronic Signature(s) Signed: 02/09/2022 10:54:48 AM By: Kalman Shan DO Signed: 02/13/2022 4:21:53 PM By: Carlene Coria RN Entered By: Carlene Coria on 02/09/2022 10:49:51 Nestle, [name] ([id number]) -------------------------------------------------------------------------------- HPI Details Patient Name: [name] Date of Service: 02/09/2022 10:15 AM Medical Record Number: [id number] Patient Account Number: [id number] Date of Birth/Sex: Apr 26, 1923 (88 y.o. 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 Dr. Amalia Hailey the podiatrist saw her on 07/20/2017 and sent her to the wound center for possible hyperbaric oxygen therapy. past medical history of peripheral vascular disease, varicose veins, status post appendectomy, basal cell carcinoma excision from the left leg, cholecystectomy, pacemaker placement, right lower extremity angiography done by Dr. dew in March 2017 with placement of a stent. there is also note of a successful ablation of the right small saphenous vein done which was reviewed by ultrasound on 10/24/2016. the patient had a right small saphenous vein ablation done on 10/20/2016. The patient has never been a smoker. She has been seen by Dr. Corene Cornea dew the vascular surgeon who most recently saw her on 06/15/2017 for evaluation of ongoing problems  with right leg swelling. She had a lower extremity arterial duplex examination done(02/13/17) which showed patent distal right superficial femoral artery stent and above-the-knee popliteal stent without evidence of restenosis. The ABI was more than 1.3 on the right and more than 1.3 on the left. This was consistent with noncompressible arteries due to medial calcification. The right great toe pressure and PPG waveforms are within normal limits and the left great toe pressure and PPG waveforms are decreased. he recommended she continue to wear her  compression stockings and continue with elevation. She is scheduled to have a noninvasive arterial study in the near future 08/16/2017 -- had a lower extremity arterial duplex examination done which showed patent distal right superficial femoral artery stent and above-the-knee popliteal stent without evidence of restenosis. The ABI was more than 1.3 on the right and more than 1.3 on the left. This was consistent with noncompressible arteries due to medial calcification. The right great toe pressure and PPG waveforms are within normal limits and the left great toe pressure and PPG waveforms are decreased. the x-ray of the right ankle has not yet been done 08/24/2017 -- had a right ankle x-ray -- IMPRESSION:1. No fracture, bone lesion or evidence of osteomyelitis. 2. Lateral soft tissue swelling with a soft tissue ulcer. she has not yet seen the vascular surgeon for review 08/31/17 on evaluation today patient's wound appears to be showing signs of improvement. She still with her appointment with vascular in order to review her results of her vascular study and then determine if any intervention would be recommended at that time. No fevers, chills, nausea, or vomiting noted at this time. She has been tolerating the dressing changes without complication. 09/28/17 on evaluation today patient's wound appears to show signs of good improvement in regard to the  granulation tissue which is surfacing. There is still a layer of slough covering the wound and the posterior portion is still significantly deeper than the anterior nonetheless there has been some good sign of things moving towards the better. She is going to go back to Dr. dew for reevaluation to ensure her blood flow is still appropriate. That will be before her next evaluation with [place] next week. No fevers, chills, nausea, or vomiting noted at this time. Patient does have some discomfort rated to be a 3-4/10 depending on activity specifically cleansing the wound makes it worse. 10/05/2017 -- the patient was seen by Dr. Lucky Cowboy last week and noninvasive studies showed a normal right ABI with brisk triphasic waveforms consistent with no arterial insufficiency including normal digital pressures. The duplex showed a patent distal right SFA stent and the proximal SFA was also normal. He was pleased with her test and thought she should have enough of perfusion for normal wound healing. He would see her back in 6 months time. 12/21/17 on evaluation today patient appears to be doing fairly well in regard to her right lateral ankle wound. Unfortunately the main issue that she is expansion at this point is that she is having some issues with what appears to be some cellulitis in the right anterior shin. She has also been noting a little bit of uncomfortable feeling especially last night and her ankle area. I'm afraid that she made the developing a little bit of an infection. With that being said I think it is in the early stages. 12/28/17 on evaluation today patient's ankle appears to be doing excellent. She's making good progress at this point the cellulitis seems to have improved after last week's evaluation. Overall she is having no significant discomfort which is excellent news. She does have an appointment with Dr. dew on March 29, 2018 for reevaluation in regard to the stent he placed. She seems to have  excellent blood flow in the right lower extremity. 01/19/12 on evaluation today patient's wound appears to be doing very well. In fact she does not appear to require debridement at this point, there's no evidence of infection, and overall from the standpoint of the wound she seems  to be doing very well. With that being said I believe that it may be time to switch to different dressing away from the [facility] Dressing she tells me she does have a lot going on her friend actually passed away yesterday and she's also having a lot of issues with her husband this obviously is weighing heavy on her as far as your thoughts and concerns today. 01/25/18 on evaluation today patient appears to be doing fairly well in regard to her right lateral malleolus. She has been tolerating the dressing changes without complication. Overall I feel like this is definitely showing signs of improvement as far as how the overall appearance of the wound is there's also evidence of epithelium start to migrate over the granulation tissue. In general I think that she is progressing nicely as far as the wound is concerned. The only concern she really has is whether or not we can switch to every other week visits in order to avoid having as many appointments as her daughters have a difficult time getting her to her appointments as well as the patient's husband to his he is not doing very well at this point. 02/22/18 on evaluation today patient's right lateral malleolus ulcer appears to be doing great. She has been tolerating the dressing changes without complication. Overall you making excellent progress at this time. Patient is having no significant discomfort. AYUSHI, PLA ([id number]) 03/15/18 on evaluation today patient appears to be doing much more poorly in regard to her right lateral ankle ulcer at this point. Unfortunately since have last seen her her husband has passed just a few days ago is obviously weighed heavily  on her her daughter also had surgery well she is with her today as usual. There does not appear to be any evidence of infection she does seem to have significant contusion/deep tissue injury to the right lateral malleolus which was not noted previous when I saw her last. It's hard to tell of exactly when this injury occurred although during the time she was spending the night in the hospital this may have been most likely. 03/22/18 on evaluation today patient appears to actually be doing very well in regard to her ulcer. She did unfortunately have a setback which was noted last week however the good news is we seem to be getting back on track and in fact the wound in the core did still have some necrotic tissue which will be addressed at this point today but in general I'm seeing signs that things are on the up and up. She is glad to hear this obviously she's been somewhat concerned that due to the how her wound digressed more recently. 03/29/18 on evaluation today patient appears to be doing fairly well in regard to her right lower extremity lateral malleolus ulcer. She unfortunately does have a new area of pressure injury over the inferior portion where the wound has opened up a little bit larger secondary to the pressure she seems to be getting. She does tell me sometimes when she sleeps at night that it actually hurts and does seem to be pushing on the area little bit more unfortunately. There does not appear to be any evidence of infection which is good news. She has been tolerating the dressing changes without complication. She also did have some bruising in the left second and third toes due to the fact that she may have bump this or injured it although she has neuropathy so she does not feel she  did move recently that may have been where this came from. Nonetheless there does not appear to be any evidence of infection at this time. 04/12/18 on evaluation today patient's wound on the right lateral  ankle actually appears to be doing a little bit better with a lot of necrotic docking tissue centrally loosening up in clearing away. However she does have the beginnings of a deep tissue injury on the left lateral malleolus likely due to the fact we've been trying offload the right as much as we have. I think she may benefit from an assistive soft device to help with offloading and it looks like they're looking at one of the doughnut conditions that wraps around the lower leg to offload which I think will definitely do a good job. With that being said I think we definitely need to address this issue on the left before it becomes a wound. Patient is not having significant pain. 04/19/18 on evaluation today patient appears to be doing excellent in regard to the progress she's made with her right lateral ankle ulcer. The left ankle region which did show evidence of a deep tissue injury seems to be resolving there's little fluid noted underneath and a blister there's nothing open at this point in time overall I feel like this is progressing nicely which is good news. She does not seem to be having significant discomfort at this point which is also good news. 04/25/18-She is here in follow up evaluation for bilateral lateral malleolar ulcers. The right lateral malleolus ulcer with pale subcutaneous tissue exposure, central area of ulcer with tendon/periosteum exposed. The left lateral malleolus ulcer now with central area of nonviable tissue, otherwise deep tissue injury. She is wearing compression wraps to the left lower extremity, she will place the right lower extremity compression wraps on when she gets home. She will be out of town over the weekend and return next week and follow-up appointment. She completed her doxycycline this morning 05/03/18 on evaluation today patient appears to be doing very well in regard to her right lateral ankle ulcer in general. At least she's showing some signs of  improvement in this regard. Unfortunately she has some additional injury to the left lateral malleolus region which appears to be new likely even over the past several days. Again this determination is based on the overall appearance. With that being said the patient is obviously frustrated about this currently. 05/10/18-She is here in follow-up evaluation for bilateral lateral malleolar ulcers. She states she has purchased offloading shoes/boots and they will arrive tomorrow. She was asked to bring them in the office at next week's appointment so her provider is aware of product being utilized. She continues to sleep on right or left side, she has been encouraged to sleep on her back. The right lateral malleolus ulcer is precariously close to peri-osteum; will order xray. The left lateral malleolus ulcer is improved. Will switch back to santyl; she will follow up next week. 05/17/18 on evaluation today patient actually appears to be doing very well in regard to her malleolus her ulcers compared to last time I saw them. She does not seem to have as much in the way of contusion at this point which is great news. With that being said she does continue to have discomfort and I do believe that she is still continuing to benefit from the offloading/pressure reducing boots that were recommended. I think this is the key to trying to get this to heal up completely. 05/24/18  on evaluation today patient actually appears to be doing worse at this point in time unfortunately compared to her last week's evaluation. She is having really no increased pain which is good news unfortunately she does have more maceration in your theme and noted surrounding the right lateral ankle the left lateral ankle is not really is erythematous I do not see signs of the overt cellulitis on that side. Unfortunately the wounds do not seem to have shown any signs of improvement since the last evaluation. She also has significant swelling  especially on the right compared to previous some of this may be due to infection however also think that she may be served better while she has these wounds by compression wrapping versus continuing to use the Juxta-Lite for the time being. Especially with the amount of drainage that she is experiencing at this point. No fevers, chills, nausea, or vomiting noted at this time. 05/31/18 on evaluation today patient appears to actually be doing better in regard to her right lateral lower extremity ulcer specifically on the malleolus region. She has been tolerating the antibiotic without complication. With that being said she still continues to have issues but a little bit of redness although nothing like she what she was experiencing previous. She still continues to pressure to her ankle area she did get the problem on offloading boots unfortunately she will not wear them she states there too uncomfortable and she can't get in and out of the bed. Nonetheless at this point her wounds seem to be continually getting worse which is not what we want I'm getting somewhat concerned about her progress and how things are going to proceed if we do not intervene in some way shape or form. I therefore had a very lengthy conversation today about offloading yet again and even made a specific suggestion for switching her to a memory foam mattress and even gave the information for a specific one that they could look at getting if it was something that they were interested in considering. She does not want to be considered for a hospital bed air mattress although honestly insurance would not cover it that she does not have any wounds on her trunk. 06/14/18 on evaluation today both wounds over the bilateral lateral malleolus her ulcers appear to be doing better there's no evidence of pressure injury at this point. She did get the foam mattress for her bed and this does seem to have been extremely beneficial for her in my  pinion. Her daughter states that she is having difficulty getting out of bed because of how soft it is. The patient also relates this to be. Nonetheless I do feel like she's actually doing better. Unfortunately right after and around the time she was getting the mattress she also sustained a fall when she got up to go pick up the phone and ended up injuring her right elbow she has 18 sutures in place. We are not caring for this currently although home health is going to be taking the sutures out shortly. Nonetheless this may be something that we need to evaluate going forward. It depends on how well it has or has not healed in the end. She also recently saw an orthopedic specialist for an injection in the right shoulder just before her fall unfortunately the fall seems to have worsened her pain. 06/21/18 on evaluation today patient appears to be doing about the same in regard to her lateral malleolus ulcers. Both appear to be just a  little bit deeper but again we are clinging away the necrotic and dead tissue which I think is why this is progressing towards a deeper realm as opposed PHILANA, YOUNIS. ([id number]) to improving from my measurement standpoint in that regard. Nonetheless she has been tolerating the dressing changes she absolutely hates the memory foam mattress topper that was obtained for her nonetheless I do believe this is still doing excellent as far as taking care of excess pressure in regard to the lateral malleolus regions. She in fact has no pressure injury that I see whereas in weeks past it was week by week I was constantly seeing new pressure injuries. Overall I think it has been very beneficial for her. 07/03/18; patient arrives in my clinic today. She has deep punched out areas over her bilateral lateral malleoli. The area on the right has some more depth. We spent a lot of time today talking about pressure relief for these areas. This started when her daughter asked for a  prescription for a memory foam mattress. I have never written a prescription for a mattress and I don't think insurances would pay for that on an ordinary bed. In any case he came up that she has foam boots that she refuses to wear. I would suggest going to these before any other offloading issues when she is in bed. They say she is meticulous about offloading this the rest of the day 07/10/18- She is seen in follow-up evaluation for bilateral, lateral malleolus ulcers. There is no improvement in the ulcers. She has purchased and is sleeping on a memory foam mattress/overlay, she has been using the offloading boots nightly over the past week. She has a follow up appointment with vascular medicine at the end of October, in my opinion this follow up should be expedited given her deterioration and suboptimal TBI results. We will order plain film xray of the left ankle as deeper structures are palpable; would consider having MRI, regardless of xray report(s). The ulcers will be treated with iodoflex/iodosorb, she is unable to safely change the dressings daily with santyl. 07/19/18 on evaluation today patient appears to be doing in general visually well in regard to her bilateral lateral malleolus ulcers. She has been tolerating the dressing changes without complication which is good news. With that being said we did have an x-ray performed on 07/12/18 which revealed a slight loosen see in the lateral portion of the distal left fibula which may represent artifact but underline lytic destruction or osteomyelitis could not be excluded. MRI was recommended. With that being said we can see about getting the patient scheduled for an MRI to further evaluate this area. In fact we have that scheduled currently for August 20 19,019. 07/26/18 on evaluation today patient's wound on the right lateral ankle actually appears to be doing fairly well at this point in my pinion. She has made some good progress currently. With  that being said unfortunately in regard to the left lateral ankle ulcer this seems to be a little bit more problematic at this time. In fact as I further evaluated the situation she actually had bone exposed which is the first time that's been the case in the bone appear to be necrotic. Currently I did review patient's note from Dr. Bunnie Domino office with [facility]. He stated that ABI was 1.26 on the right and 0.95 on the left with good waveforms. Her perfusion is stable not reduced from previous studies and her digital waveforms were  pretty good particularly on the right. His conclusion upon review of the note was that there was not much she could do to improve her perfusion and he felt she was adequate for wound healing. His suggestion was that she continued to see [place] and consider a synthetic skin graft if there was no underlying infection. He plans to see her back in six months or as needed. 08/01/18 on evaluation today patient appears to be doing better in regard to her right lateral ankle ulcer. Her left lateral ankle ulcer is about the same she still has bone involvement in evidence of necrosis. There does not appear to be evidence of infection at this time On the right lateral lower extremity. I have started her on the Augmentin she picked this up and started this yesterday. This is to get her through until she sees infectious disease which is scheduled for 08/12/18. 08/06/18 on evaluation today patient appears to be doing rather well considering my discussion with patient's daughter at the end of last week. The area which was marked where she had erythema seems to be improved and this is good news. With that being said overall the patient seems to be making good improvement when it comes to the overall appearance of the right lateral ankle ulcer although this has been slow she at least is coming around in this regard. Unfortunately in regard to the left lateral ankle ulcer this  is osteomyelitis based on the pathology report as well is bone culture. Nonetheless we are still waiting CT scan. Unfortunately the MRI we originally ordered cannot be performed as the patient is a pacemaker which I had overlooked. Nonetheless we are working on the CT scan approval and scheduling as of now. She did go to the hospital over the weekend and was placed on IV Cefzo for a couple of days. Fortunately this seems to have improved the erythema quite significantly which is good news. There does not appear to be any evidence of worsening infection at this time. She did have some bleeding after the last debridement therefore I did not perform any sharp debridement in regard to left lateral ankle at this point. Patient has been approved for a snap vac for the right lateral ankle. 08/14/18; the patient with wounds over her bilateral lateral malleoli. The area on the right actually looks quite good. Been using a snap back on this area. Healthy granulation and appears to be filling in. Unfortunately the area on the left is really problematic. 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 Also noted to have cellulitis. She saw Dr. Novella Olive of infectious disease today and was put on linezolid. We are able to verify this with her pharmacy. She is completed the Augmentin that she was already on. We've been using Iodoflex to this area 08/23/18 on evaluation today patient's wounds both actually appear to be doing better compared to my prior evaluations. Fortunately she showing signs of good improvement in regard to the overall wound status especially where were using the snap vac on the right. In regard to left lateral malleolus the wound bed actually appears to be much cleaner than previously noted. I do not feel any phone directly probed during evaluation today and though there is tendon noted this does not appear to be necrotic  it's actually fairly good as far as the overall appearance of the tendon is concerned. In general the wound bed actually appears  to be doing significantly better than it was previous. Patient is currently in the care of [name] and I did review that note today. He actually has her on two weeks of linezolid and then following the patient will be on 1-2 months of Keflex. That is the plan currently. She has been on antibiotics therapy as prescribed by myself initially starting on July 30, 2018 and has been on that continuously up to this point. 08/30/18 on evaluation today patient actually appears to be doing much better in regard to her right lateral malleolus ulcer. She has been tolerating the dressing changes specifically the snap vac without complication although she did have some issues with the seal currently. Apparently there was some trouble with getting it to maintain over the past week past Sunday. Nonetheless overall the wound appears better in regard to the right lateral malleolus region. In regard to left lateral malleolus this actually show some signs of additional granulation although there still tendon noted in the base of the wound this appears to be healthy not necrotic in any way whatsoever. We are considering potentially using a snap vac for the left lateral malleolus as well the product wrap from [employer], [place], was present in the clinic today we're going to see this patient I did have her come in with me after obtaining consent from the patient and her daughter in order to look at the wound and see if there's any recommendation one way or another as to whether or not they felt the snapback could be beneficial for the left lateral malleolus region. But the conclusion was that it might be but that this is definitely a little bit deeper wound than what traditionally would be utilized for a snap vac. 09/06/18 on evaluation today patient actually appears to be doing excellent in my pinion  in regard to both ankle ulcers. She has been tolerating the dressing changes without complication which is great news. Specifically we have been using the snap vac. In regard to the right ankle I'm not even sure that this is going to be necessary for today and following as the wound has filled in quite nicely. In regard to the left ankle I do believe ELLANIE, OPPEDISANO ([id number]) that we're seeing excellent epithelialization from the edge as well as granulation in the central portion the tendon is still exposed but there's no evidence of necrotic bone and in general I feel like the patient has made excellent progress even compared to last week with just one week of the snap vac. 09/11/18; this is a patient who has wounds on her bilateral lateral malleoli. Initially both of these were deep stage IV wounds in the setting of chronic arterial insufficiency. She has been revascularized. As I understand think she been using snap vacs to both of these wounds however the area on the right became more superficial and currently she is only using it on the left. Using silver collagen on the right and silver collagen under the back on the left I believe 09/19/18 on evaluation today patient actually appears to be doing very well in regard to her lateral malleolus or ulcers bilaterally. She has been tolerating the dressing changes without complication. Fortunately there does not appear to be any evidence of infection at this time. Overall I feel like she is improving in an excellent manner and I'm very pleased with the fact that everything seems to be turning towards the better for her. This has obviously been a long road. 09/27/18  on evaluation today patient actually appears to be doing very well in regard to her bilateral lateral malleolus ulcers. She has been tolerating the dressing changes without complication. Fortunately there does not appear to be any evidence of infection at this time which is also great  news. No fevers, chills, nausea, or vomiting noted at this time. Overall I feel like she is doing excellent with the snap vac on the left malleolus. She had 40 mL of fluid collection over the past week. 10/04/18 on evaluation today patient actually appears to be doing well in regard to her bilateral lateral malleolus ulcers. She continues to tolerate the dressing changes without complication. One issue that I see is the snap vac on the left lateral malleolus which appears to have sealed off some fluid underlying this area and has not really allowed it to heal to the degree that I would like to see. For that reason I did suggest at this point we may want to pack a small piece of packing strip into this region to allow it to more effectively wick out fluid. 10/11/18 in general the patient today does not feel that she has been doing very well. She's been a little bit lethargic and subsequently is having bodyaches as well according to what she tells me today. With that being said overall she has been concerned with the fact that something may be worsening although to be honest her wounds really have not been appearing poorly. She does have a new ulcer on her left heel unfortunately. This may be pressure related. Nonetheless it seems to me to have potentially started at least as a blister I do not see any evidence of deep tissue injury. In regard to the left ankle the snap vac still seems to be causing the ceiling off of the deeper part of the wound which is in turn trapping fluid. I'm not extremely pleased with the overall appearance as far as progress from last week to this week therefore I'm gonna discontinue the snap vac at this point. 10/18/18 patient unfortunately this point has not been feeling well for the past several days. She was seen by Grayland Ormond her primary care provider who is a Librarian, academic at [facility]. Subsequently she states that she's been very weak and generally feeling  malaise. No fevers, chills, nausea, or vomiting noted at this time. With that being said bloodwork was performed at the PCP office on the 11th of this month which showed a white blood cell count of 10.7. This was repeated today and shows a white blood cell count of 12.4. This does show signs of worsening. Coupled with the fact that she is feeling worse and that her left ankle wound is not really showing signs of improvement I feel like this is an indication that the osteomyelitis is likely exacerbating not improving. Overall I think we may also want to check her C-reactive protein and sedimentation rate. Actually did call [facility] office this afternoon while the patient was in the office here with me. Subsequently based on the findings we discussed treatment possibilities and I think that it is appropriate for [place] to go ahead and initiate treatment with doxycycline which I'm going to do. Subsequently he did agree to see about adding a CRP and sedimentation rate to her orders. If that has not already been drawn to where they can run it they will contact the patient she can come back to have that check. They are in agreement with plan  as far as the patient and her daughter are concerned. Nonetheless also think we need to get in touch with Dr. Henreitta Leber office to see about getting the patient scheduled with him as soon as possible. 11/08/18 on evaluation today patient presents for follow-up concerning her bilateral foot and ankle ulcers. I did do an extensive review of her chart in epic today. Subsequently she was seen by [name] he did initiate Cefepime IV antibiotic therapy. Subsequently she had some issues with her PICC line this had to be removed because it was coiled and then replaced. Fortunately that was now settled. Unfortunately she has continued have issues with her left heel as well as the issues that she is experiencing with her bilateral lateral malleolus regions. I do believe however  both areas seem to be doing a little bit better on evaluation today which is good news. No fevers, chills, nausea, or vomiting noted at this time. She actually has an angiogram schedule with Dr. dew on this coming Monday, November 11, 2018. Subsequently the patient states that she is feeling much better especially than what she was roughly 2 weeks ago. She actually had to cancel an appointment because she was feeling so poorly. No fevers, chills, nausea, or vomiting noted at this time. 11/15/18 on evaluation today patient actually is status post having had her angiogram with Dr. dew Monday, four days ago. 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 He had to go and work on several areas of the vasculature fortunately he was able to obtain no more than a 30% residual stenosis throughout post procedure. I reviewed this note today. I think this will definitely help with healing at this time. Fortunately there does not appear to be any signs of infection and I do feel like ratio already has a better appearance to it. 11/22/18 upon evaluation today patient actually appears to be doing very well in regard to her wounds in general. The right lateral malleolus looks excellent the heel looks better in the left lateral malleolus also appears to be doing a little better. With that being said the right second toe actually appears to be open and training we been watching this is been dry and stable but now is open. 12/03/2018 Seen today for follow-up and management of multiple bilateral lower extremity wounds. New pressure injury of the great toe which is closed at this time. Wound of the right distal second toe appears larger today with deep undermining and a pocket of fluid present within the undermining region. Left and right malleolus is wounds are stable today with no signs and symptoms of infection.Denies any needs or concerns during exam today. 12/13/18 on evaluation today patient  appears to be doing somewhat better in regard to her left heel ulcer. She also seems to be completely healed in regard to the right lateral malleolus ulcer. The left malleolus ulcer is smaller what unfortunately the wounds which are new over the first and second toes of the right foot are what are most concerning at this point especially the second. Both areas did require sharp debridement today. 12/20/18 on evaluation today patient's wound actually appears to be doing better in regard to left lateral ankle and her right lateral ankle continues to remain healed. The hill ulcer on the left is improved. She does have improvement noted as well in regard to both toe ulcers. Overall I'm very pleased in this regard. No fevers, chills, nausea, or vomiting noted at  this time. 12/23/18 on evaluation today patient is seen after she had her toenails trimmed at the podiatrist office due to issues with her right great toe. There was what appeared to be dark eschar on the surface of the wound which had her in the podiatrist concerned. Nonetheless as I remember that during the last office visit I had utilize silver nitrate of this area I was much less concerned about the situation. Subsequently I was able to clean off much of this tissue without any complication today. This does not appear to show any signs of infection and actually look somewhat better Altemose, [name] ([id number]) compared to last time post debridement. Her second toe on the right foot actually had callous over and there did appear still be some fluid underneath this that would require debridement today. 12/27/18 on evaluation today patient actually appears to be showing signs of improvement at all locations. Even the left lateral ankle although this is not quite as great as the other sites. Fortunately there does not appear to be any signs of infection at this time and both of her toes on the right foot seem to be showing signs of improvement  which is good news and very pleased in this regard. 01/03/19 on evaluation today patient appears to be doing better for the most part in regard to her wounds in particular. There does not appear to be any evidence of infection at this time which is good news. Fortunately there is no sign of really worsening anywhere except for the right great toe which she does have what appears to be a bruise/deep tissue injury which is very superficial and already resolving. I'm not sure where this came from I questioned her extensively and she does not recall what may have happened with this. Other than that the patient seems to be doing well even the left lateral ankle ulcer looks good and is getting smaller. 01/10/19 on evaluation today patient appears to be doing well in regard to her left heel wound and both of her toe wounds. Overall I feel like there is definitely improvement here and I'm happy in that regard. With that being said unfortunately she is having issues with the left lateral malleolus ulcer which unfortunately still has a lot of depth to it. This is gonna be a very difficult wound for [place] to be able to truly get to heal. I may want to consider some type of skin substitute to see if this would be of benefit for her. I'll discuss this with her more the next visit most likely. This was something I thought about more at the end of the visit when I was Artie out of the room and the patient had been discharged. 01/17/19 on evaluation today patient appears to be doing very well in regard to her wounds in general. She's been making excellent progress at this time. Fortunately there's no sign of infection at this time either. No fevers, chills, nausea, or vomiting noted at this time. The biggest issue is still her left lateral malleolus where it appears to be doing well and is getting smaller but still shows a small corner where this is deeper and goes down into what appears to be the joint space. Nonetheless  this is taking much longer to heal although it still looks better in smaller than previous evaluations. 01/24/19 on evaluation today patient's wounds actually appear to be doing rather well in general overall. She did require some sharp debridement in regard to the  right great toe but everything else appears to be doing excellent no debridement was even necessary. No fevers, chills, nausea, or vomiting noted at this time. 01/31/19 on evaluation today patient actually appears to be doing much better in regard to her left foot wound on the heel as well as the ankle. The right great toe appears to be a little bit worse today this had callous over and trapped a lot of fluid underneath. Fortunately there's no signs of infection at any site which is great news. 02/07/19 on evaluation today patient actually appears to be doing decently well in regard to all of her ulcers at this point. No sharp debridement was required she is a little bit of hyper granulation in regard to the left lateral ankle as well as the left heel but the hill itself is almost completely healed which is excellent news. Overall been very pleased in this regard. 02/14/19 on evaluation today patient actually appears to be doing very well in regard to her ulcers on the right first toe, left lateral malleolus, and left heel. In fact the heel is almost completely healed at this point. The patient does not show any signs of infection which is good news. Overall very pleased with how things have progressed. 04/18/19 Telehealth Evaluation During the COVID-19 National Emergency: Verbal Consent: Obtained from patient Allergies: reviewed and the active list is current. Medication changes: patient has no current medication changes. COVID-19 Screening: 1. Have you traveled internationally or on a cruise ship in the last 14 dayso No 2. Have you had contact with someone with or under investigation for COVID-19o No 3. Have you had a fever, cough, sore  throat, or experiencing shortness of breatho No on evaluation today actually did have a visit with this patient through a telehealth encounter with her home health nurse. Subsequently it was noted that the patient actually appears to be doing okay in regard to her wounds both the right great toe as well as the left lateral malleolus have shown signs of improvement although this in your theme around the left lateral malleolus there eschar coverings for both locations. The question is whether or not they are actually close and whether or not home health needs to discharge the patient or not. Nonetheless my concern is this point obviously is that without actually seeing her and being able to evaluate this directly I cannot ensure that she is completely healed which is the question that I'm being asked. 04/22/19 on evaluation today patient presents for her first evaluation since last time I saw her which was actually February 14, 2019. I did do a telehealth visit last week in which point it was questionable whether or not she may be healed and had to bring her in today for confirmation. With that being said she does seem to be doing quite well at this point which is good news. There does not appear to be any drainage in the deed I believe her wounds may be healed. Readmission: 09/04/2019 on evaluation today patient appears to be doing unfortunately somewhat more poorly in regard to her left foot ulcer secondary to a wound that began on 08/21/2019 at least when she first noticed this. Fortunately she has not had any evidence of active infection at this time. Systemically. I also do not necessarily see any evidence of infection at the blister/wound site on the first metatarsal head plantar aspect. This almost appears to be something that may have just rubbed inappropriately causing this to breakdown. They  did not want a wait too long to come in to be seen as again she had significant issues in the past with  wounds that took quite a while to heal in fact it was close to 2 years. Nonetheless this does not appear to be quite that bad but again we do need to remove some of the necrotic tissue from the surface of the wound to tell exactly the extent. She does not appear to have any significant arterial disease at this point and again her last ABIs and TBI's are recorded above in the alert section her left ABI was 1.27 with a TBI of 0.72 to the right ABI 1.08 with a TBI of 0.39. Other than this the patient has been doing quite well since I last saw her and that was in May 2020. 09/11/2019 on evaluation today patient appeared to be doing very well with regard to her plantar foot ulcer on the left. In fact this appears to be almost completely healed which is awesome. That is after just 1 week of intervention. With that being said there is no signs of active infection at this time. JACCI, RUBERG ([id number]) 09/18/2019 on evaluation today patient actually appears to be doing excellent in fact she is completely healed based on what I am seeing at this point. Fortunately there is no signs of active infection at this time and overall patient is very pleased to hear that this area has healed so quickly. Readmission: 05/13/2021 upon evaluation today this patient presents for reevaluation here in the clinic. This is a wound that actually we previously took care of. She had 1 on the right ankle and the left the left turned out to be be harder due to to heal but nonetheless is doing great at this point as the right that has reopened and it was noted first just several weeks ago with a scab over it and came off in just the past few days. Fortunately there does not appear to be any obvious evidence of significant active infection at this time which is great news. No fevers, chills, nausea, vomiting, or diarrhea. 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 There does not  appear to be any signs of this interfering in any way with her wound. She does have swelling we previously had compression socks for her ordered but again it does not look like she wears these on a regular basis by any means. 05/26/2021 upon evaluation today patient appears to be doing well with regard to her wound which is actually showing signs of excellent improvement. There does not appear to be any signs of active infection which is great news and overall very pleased with where things stand today. No fevers, chills, nausea, vomiting, or diarrhea. 06/02/2021 upon evaluation today patient's wound actually showing signs of excellent improvement. Fortunately there does not appear to be any signs of active infection which is great news. I think the patient is making good progress with regard to her wounds in general. 06/09/2021 upon evaluation today patient appears to be doing excellent in regard to her wounds currently. Fortunately there does not appear to be any signs of active infection which is great news. No fevers, chills, nausea, vomiting, or diarrhea. Overall extremely pleased with where things stand today. I think the patient is making excellent progress. 06/16/2021 upon evaluation today patient appears to be doing well in regard to her wound. This is going require little bit  of debridement today and that was discussed with the patient. Otherwise she seems to be doing quite well and I am actually very pleased with where things stand at this point. No fevers, chills, nausea, vomiting, or diarrhea. 06/23/2021 upon evaluation today patient appears to be doing well with regard to her wounds. She has been tolerating the dressing changes without complication. Fortunately there does not appear to be any evidence of infection and she has not had air in her home which she actually lives at an assisted living that got fixed this morning. With that being said because of that her wrap has been extremely hot  and bothersome for her over the past week. 06/30/2021 upon evaluation today patient is actually making excellent progress in regard to her ankle ulcer. She has been tolerating the dressing changes without complication and overall extremely pleased with where things stand there does not appear to be any evidence of active infection which is great news. No fevers, chills, nausea, vomiting, or diarrhea. 07/07/21 upon evaluation today patients and culture on the right actually appears to be doing quite well. There does not appear to be any signs of infection and overall very pleased with where things stand today. No fevers, chills, nausea, or vomiting noted at this time. 07/14/2021 unfortunately the patient today has some evidence of deep tissue injury and pressure getting to the ankle region. Again I am not exactly sure what is going on here but this is very similar to issues that we have had in the past. I explained to the patient that she needs to be very mindful of exactly what is happening I think sleeping in bed is probably the main issue here although there could be other culprits I am not sure what else would potentially lead to this kind of a problem for her. 07/21/2021 upon evaluation today patient's wound actually showing signs of improvement compared to last week. Fortunately there does not appear to be any signs of active infection which is great news and overall very pleased with where things stand in that regard. With that being said I do believe that she is continuing to show signs of overall of getting better although I think this is still basically about what we were 2 weeks ago due to the worsening and now improvement. 07/28/2021 upon evaluation today patient appears to be doing well with regard to her wound. She does have some slough buildup on the surface of the wound which I would have to manage today. Fortunately there is no sign of active infection at this time. No fevers, chills,  nausea, vomiting, or diarrhea. 08/04/2021 upon evaluation today patient appears to be doing about the same in regard to her wound. To be perfectly honest I am beginning to be a little bit concerned about the overall appearance of the wound bed. I do think possibly taking a sample right around the margin of the wound could be beneficial for her as far as identifying anything such as an inflammatory process or to be honest even a skin tag cancer type process that may be of concern here. Fortunately there does not appear to be any evidence of active infection at this time which is great news she is not having any pain also great news. 08/11/2021 upon evaluation today patient appears to be doing well with regard to her wound. The good news is I did review her biopsy results and it showed some inflammatory mixed findings but nothing that appeared to be malignant which  is great news. Overall this is more of a chronic venous stasis type issue which again is more what we have been treating. Nonetheless I just wanted to make sure before going forward that there was not anything more untoward going on at this point. 08/18/2021 upon evaluation today patient appears to be doing well with regard to her ankle ulcer. Fortunately there does not appear to be any signs of active infection at this time which is great overall wound is dramatically improved compared to last week. Since last week I have actually placed her on doxycycline and subsequently this is a good option as far as the findings are concerned at this point. 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 the doxycycline is doing that. There does not appear to be any evidence of active infection systemically which is great news. 08/25/2021 upon evaluation today patient appears to be doing well with regard to her wound. I feel like we are  finally get back on track as far as healing is concerned I am much happier with the overall appearance today. I do think that she is tolerating the dressing changes without complication which is great news. We have been using Hydrofera Blue which I think is a good option. The good news is she is also doing great in regard to her compression sock on the left which is a zipper compression that seems to be doing a great job keeping her edema under good control. 09/01/2021 upon evaluation today patient appears to be doing well with regard to her wound. Infection seems to be under much better control which is great news and very pleased in that regard. Fortunately there does not appear to be any signs of infection currently. JANELYS, GLASSNER ([id number]) 09/08/2021 upon evaluation today patient actually appears to be making good progress in regard to her wound. She has been tolerating the dressing changes without complication. Fortunately there does not appear to be any evidence of active infection at this time which is great news as well. No fevers, chills, nausea, vomiting, or diarrhea. 09/22/2021 upon evaluation today patient appears to be doing well with regard to her wound although is very slow to heal. We have not looked into Apligraf yet I think that is something that we should see about doing. 09/29/2021 upon evaluation today patient's wound is actually showing signs of doing about the same. I am not seeing any evidence of worsening but also no significant evidence of improvement. We did gain approval for the organogenesis products all except for Apligraf as covered by her insurance. With that being said I do think that we can go ahead and proceed with the NuShield if the patient and her family in agreement of the plan I discussed that with him today she does have a 20% coinsurance which we also discussed. 10/06/2021 upon evaluation today patient appears to unfortunately be doing a little bit  worse she appears to be infected based on what I am seeing. Fortunately there does not appear to be any signs of active infection at this time which is great news. Unfortunately it does appear to be some evidence of around the wound edge indicated by way of erythema and warmth as well as redness 10/13/2021 upon evaluation today patient actually appears to be doing excellent in regard to her ankle ulcer compared to what it was. Fortunately  though she does have evidence of infection, MRSA, on the culture which I did review this overall should be managed by the antibiotic that I given her which was the doxycycline and again today this seems to be doing much better. I think were fine to go ahead and apply the NuShield today. 10/20/2021 upon evaluation today patient appears to be doing excellent in fact the NuShield seems to have done all some for her thus far. I am actually very pleased with where we stand and overall I think that she is making great progress. There is no evidence of active infection at this time. 11/23; patient presents for follow-up. She has no issues or complaints today. She denies signs of infection. She reports stability in her wound healing. 11/03/2021 upon evaluation today patient appears to be doing well with regard to her wounds. She has been tolerating the dressing changes without complication. Fortunately there is no signs of active infection at this time. 11/10/2021 upon evaluation today patient appears to be doing well with regard to her right ankle which is actually showing signs of improvement with the NuShield I am very pleased. Subsequently in regards to the left ankle this appears to be doing okay with no evidence of issue here either. With that being said she has a significant contusion on the left leg from having fallen 2 days ago. She tells me that she was using her walker going to the closet and then when she got to the closet turned around to get something out at  which point she fell according to the story. Nonetheless she does have a hematoma just below her knee on the anterior portion of her shin. My hope is that this will not open until wound although we do need I think Some compression over it also think that we need to have her use an ice as well to help with the swelling and prevent this from getting worse. The last thing she needs is a wound opened up here. 11/17/2021 upon evaluation patient's right ankle actually showing signs of improvement based on what I see currently there is a lot of new skin growth coming in which is great news. Nonetheless I do feel like that the patient is showing improvement as well in regard to her left leg with the use of the Tubigrip it swollen but not as bruised as what I would have expected after what I was seeing last week. Nonetheless I do think doubling up on the Tubigrip would probably be beneficial to try to keep some of the edema under better control here. 11/24/2021 upon evaluation today patient appears to be doing excellent in regard to her wound. This is actually measuring significantly smaller which is great news. Fortunately I do not see any signs of active infection locally nor systemically at this point. Overall I am very pleased with how the NuShield is doing. 12/30; wound bed looks healthy however not much change in overall wound volume. We applied Nushield again today in the standard fashion 12/08/2021 upon evaluation today patient's wound actually showing signs of good improvement and I am actually very pleased with where we stand today as well. I do not see any evidence of active infection locally nor systemically which is great news. Unfortunately she has been having some issues with stomach upset and diarrhea today. 12/15/2021 upon evaluation today patient appears to be doing excellent in regard to her wound. There is a little bit of dry skin raised up around the edges of  the wound but fortunately  nothing too significant at this point. Fortunately I do not see any evidence of active infection either which is great news. No fevers, chills, nausea, vomiting, or diarrhea. 12/29/2021 upon evaluation today patient appears to be doing well with regard to her wound. In general I feel like she is getting very close to complete resolution. I think that she is making good progress here as well. Overall I do not see any signs of infection and very little of this appears to actually be open. 01/12/2022 upon evaluation today patient appears to be doing a little bit worse in regard to her wound. It appears that the collagen actually trapped fluid underneath the collagen which got hard and subsequently caused the fluid to back up. This patient has made the wound appear to be larger than what it was previous. With that being said I do not see any signs of obvious infection is not warm to touch and not significantly erythematous either which is good news. Nonetheless I do think that we will need to continue to keep an eye on this. Probably not go put on antibiotic this week but I will be too far from doing so she is actually on a Z-Pak right now I do not want to really double up on antibiotics. 01/26/2022 upon evaluation today patient's wound is showing signs of improvement although this is slow to turn back around. Fortunately there does not appear to be any evidence of active infection locally or systemically which is great news and overall I am extremely pleased with where we stand today. No fevers, chills, nausea, vomiting, or diarrhea. 02/02/2022 upon evaluation today patient appears to be doing well with regard to her wound. She has been tolerating the dressing changes without complication. Fortunately there does not appear to be any signs of active infection locally nor systemically at this time which is great news. No fevers, chills, nausea, vomiting, or diarrhea. 3/9; patient presents for follow-up. She has  no issues or complaints today. She denies signs of infection. Electronic Signature(s) ERICKA, MARCELLUS ([id number]) Signed: 02/09/2022 10:54:48 AM By: Kalman Shan DO Entered By: Kalman Shan on 02/09/2022 10:51:24 [name] ([id number]) -------------------------------------------------------------------------------- Physical Exam Details Patient Name: [name], [name] Date of Service: 02/09/2022 10:15 AM Medical Record Number: [id number] Patient Account Number: [id number] Date of Birth/Sex: 03-Aug-1923 (88 y.o. F) Treating RN: Carlene Coria Primary Care Provider: Adrian Prows Other Clinician: Referring Provider: Adrian Prows Treating Provider/Extender: Yaakov Guthrie in Treatment: 2 Constitutional . Cardiovascular . Psychiatric . Notes Right ankle: To the lateral malleolus there is an open wound with granulation tissue and nonviable tissue present. No surrounding signs of infection. Electronic Signature(s) Signed: 02/09/2022 10:54:48 AM By: Kalman Shan DO Entered By: Kalman Shan on 02/09/2022 10:52:10 Lotito, [name] ([id number]) -------------------------------------------------------------------------------- Physician Orders Details Patient Name: [name] Date of Service: 02/09/2022 10:15 AM Medical Record Number: [id number] Patient Account Number: [id number] Date of Birth/Sex: 14-Apr-1923 (88 y.o. F) Treating RN: Carlene Coria Primary Care Provider: Adrian Prows Other Clinician: Referring Provider: Adrian Prows Treating Provider/Extender: Yaakov Guthrie in Treatment: 25 Verbal / Phone Orders: No Diagnosis Coding Follow-up Appointments o Return Appointment in 1 week. Bathing/ Shower/ Hygiene o May shower with wound dressing protected with water repellent cover or cast protector. Edema Control - Lymphedema / Segmental Compressive Device / Other o Optional: One layer of unna paste to top of  compression wrap (to act as an anchor). - and at toes o Patient to  wear own compression stockings. Remove compression stockings every night before going to bed and put on every morning when getting up. - left leg o Elevate, Exercise Daily and Avoid Standing for Long Periods of Time. o Elevate legs to the level of the heart and pump ankles as often as possible o Elevate leg(s) parallel to the floor when sitting. Wound Treatment Wound #7 - Malleolus Wound Laterality: Right, Lateral Cleanser: Soap and Water 1 x Per Week/30 Days Discharge Instructions: Gently cleanse wound with antibacterial soap, rinse and pat dry prior to dressing wounds Primary Dressing: Algicell Calcium Alginate Dressing 2x2 (in/in) 1 x Per Week/30 Days Primary Dressing: Medihoney - Gel, 1.5 (oz), tube 1 x Per Week/30 Days Secondary Dressing: Gauze 1 x Per Week/30 Days Discharge Instructions: As directed: dry, moistened with saline or moistened with Dakins Solution Compression Wrap: 3-LAYER WRAP - Profore Lite LF 3 Multilayer Compression Bandaging System 1 x Per Week/30 Days Discharge Instructions: Apply 3 multi-layer wrap as prescribed. Electronic Signature(s) Signed: 02/09/2022 10:54:48 AM By: Kalman Shan DO Entered By: Kalman Shan on 02/09/2022 10:54:20 Witczak, [name] ([id number]) -------------------------------------------------------------------------------- Problem List Details Patient Name: JAY, KEMPE Date of Service: 02/09/2022 10:15 AM Medical Record Number: [id number] Patient Account Number: [id number] Date of Birth/Sex: 02-Nov-1923 (88 y.o. 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, stage 3 05/13/2021 No Yes I89.0  Lymphedema, not elsewhere classified 05/13/2021 No Yes I87.2 Venous insufficiency (chronic) (peripheral) 05/13/2021 No Yes Z95.0 Presence of cardiac pacemaker 05/13/2021 No Yes Z79.01 Long term (current) use of anticoagulants 05/13/2021 No Yes Inactive Problems ICD-10 Code Description Active Date Inactive Date S80.12XA Contusion of left lower leg, initial encounter 11/10/2021 11/10/2021 Resolved Problems Electronic Signature(s) Signed: 02/09/2022 10:54:48 AM By: Kalman Shan DO Entered By: Kalman Shan on 02/09/2022 10:50:38 Pisani, [name] ([id number]) -------------------------------------------------------------------------------- Progress Note Details Patient Name: [name] Date of Service: 02/09/2022 10:15 AM Medical Record Number: [id number] Patient Account Number: [id number] Date of Birth/Sex: July 18, 1923 (88 y.o. 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 Dr. Amalia Hailey the podiatrist saw her on 07/20/2017 and sent her to the wound center for possible hyperbaric oxygen therapy. past medical history of peripheral vascular disease, varicose veins, status post appendectomy, basal cell carcinoma excision from the left leg, cholecystectomy, pacemaker placement, right lower extremity angiography done by Dr. dew in March 2017 with placement of a stent. there is also note of a successful ablation of the right small saphenous vein done which was reviewed by ultrasound on 10/24/2016. the patient had a right small saphenous vein  ablation done on 10/20/2016. The patient has never been a smoker. She has been seen by Dr. Corene Cornea dew the vascular surgeon who most recently saw her on 06/15/2017 for evaluation of ongoing problems with right leg swelling. She had a lower extremity arterial duplex examination done(02/13/17) which showed patent distal right superficial femoral artery stent and above-the-knee popliteal stent without evidence of restenosis. The ABI was more than 1.3 on the  right and more than 1.3 on the left. This was consistent with noncompressible arteries due to medial calcification. The right great toe pressure and PPG waveforms are within normal limits and the left great toe pressure and PPG waveforms are decreased. he recommended she continue to wear her compression stockings and continue with elevation. She is scheduled to have a noninvasive arterial study in the near future 08/16/2017 -- had a lower extremity arterial duplex examination done which showed patent distal right superficial femoral artery stent and above-the-knee popliteal stent without evidence of restenosis. The ABI was more than 1.3 on the right and more than 1.3 on the left. This was consistent with noncompressible arteries due to medial calcification. The right great toe pressure and PPG waveforms are within normal limits and the left great toe pressure and PPG waveforms are decreased. the x-ray of the right ankle has not yet been done 08/24/2017 -- had a right ankle x-ray -- IMPRESSION:1. No fracture, bone lesion or evidence of osteomyelitis. 2. Lateral soft tissue swelling with a soft tissue ulcer. she has not yet seen the vascular surgeon for review 08/31/17 on evaluation today patient's wound appears to be showing signs of improvement. She still with her appointment with vascular in order to review her results of her vascular study and then determine if any intervention would be recommended at that time. No fevers, chills, nausea, or vomiting  noted at this time. She has been tolerating the dressing changes without complication. 09/28/17 on evaluation today patient's wound appears to show signs of good improvement in regard to the granulation tissue which is surfacing. There is still a layer of slough covering the wound and the posterior portion is still significantly deeper than the anterior nonetheless there has been some good sign of things moving towards the better. She is going to go back to Dr. dew for reevaluation to ensure her blood flow is still appropriate. That will be before her next evaluation with [place] next week. No fevers, chills, nausea, or vomiting noted at this time. Patient does have some discomfort rated to be a 3-4/10 depending on activity specifically cleansing the wound makes it worse. 10/05/2017 -- the patient was seen by Dr. Lucky Cowboy last week and noninvasive studies showed a normal right ABI with brisk triphasic waveforms consistent with no arterial insufficiency including normal digital pressures. The duplex showed a patent distal right SFA stent and the proximal SFA was also normal. He was pleased with her test and thought she should have enough of perfusion for normal wound healing. He would see her back in 6 months time. 12/21/17 on evaluation today patient appears to be doing fairly well in regard to her right lateral ankle wound. Unfortunately the main issue that she is expansion at this point is that she is having some issues with what appears to be some cellulitis in the right anterior shin. She has also been noting a little bit of uncomfortable feeling especially last night and her ankle area. I'm afraid that she made the developing a little bit of an infection. With that being said I think it is in the early stages. 12/28/17 on evaluation today patient's ankle appears to be doing excellent. She's making good progress at this point the cellulitis seems to have improved after last week's evaluation. Overall she is  having no significant discomfort which is excellent news. She does have an appointment with Dr. dew on March 29, 2018 for reevaluation in regard to the stent he placed. She seems to  have excellent blood flow in the right lower extremity. 01/19/12 on evaluation today patient's wound appears to be doing very well. In fact she does not appear to require debridement at this point, there's no evidence of infection, and overall from the standpoint of the wound she seems to be doing very well. With that being said I believe that it may be time to switch to different dressing away from the [facility] Dressing she tells me she does have a lot going on her friend actually passed away yesterday and she's also having a lot of issues with her husband this obviously is weighing heavy on her as far as your thoughts and concerns today. 01/25/18 on evaluation today patient appears to be doing fairly well in regard to her right lateral malleolus. She has been tolerating the dressing changes without complication. Overall I feel like this is definitely showing signs of improvement as far as how the overall appearance of the wound is there's also evidence of epithelium start to migrate over the granulation tissue. In general I think that she is progressing nicely as far as the wound is concerned. The only concern she really has is whether or not we can switch to every other week visits in order to avoid having as many YARIANNA, VARBLE ([id number]) appointments as her daughters have a difficult time getting her to her appointments as well as the patient's husband to his he is not doing very well at this point. 02/22/18 on evaluation today patient's right lateral malleolus ulcer appears to be doing great. She has been tolerating the dressing changes without complication. Overall you making excellent progress at this time. Patient is having no significant discomfort. 03/15/18 on evaluation today patient appears to be  doing much more poorly in regard to her right lateral ankle ulcer at this point. Unfortunately since have last seen her her husband has passed just a few days ago is obviously weighed heavily on her her daughter also had surgery well she is with her today as usual. There does not appear to be any evidence of infection she does seem to have significant contusion/deep tissue injury to the right lateral malleolus which was not noted previous when I saw her last. It's hard to tell of exactly when this injury occurred although during the time she was spending the night in the hospital this may have been most likely. 03/22/18 on evaluation today patient appears to actually be doing very well in regard to her ulcer. She did unfortunately have a setback which was noted last week however the good news is we seem to be getting back on track and in fact the wound in the core did still have some necrotic tissue which will be addressed at this point today but in general I'm seeing signs that things are on the up and up. She is glad to hear this obviously she's been somewhat concerned that due to the how her wound digressed more recently. 03/29/18 on evaluation today patient appears to be doing fairly well in regard to her right lower extremity lateral malleolus ulcer. She unfortunately does have a new area of pressure injury over the inferior portion where the wound has opened up a little bit larger secondary to the pressure she seems to be getting. She does tell me sometimes when she sleeps at night that it actually hurts and does seem to be pushing on the area little bit more unfortunately. There does not appear to be any evidence of infection which  is good news. She has been tolerating the dressing changes without complication. She also did have some bruising in the left second and third toes due to the fact that she may have bump this or injured it although she has neuropathy so she does not feel she did move  recently that may have been where this came from. Nonetheless there does not appear to be any evidence of infection at this time. 04/12/18 on evaluation today patient's wound on the right lateral ankle actually appears to be doing a little bit better with a lot of necrotic docking tissue centrally loosening up in clearing away. However she does have the beginnings of a deep tissue injury on the left lateral malleolus likely due to the fact we've been trying offload the right as much as we have. I think she may benefit from an assistive soft device to help with offloading and it looks like they're looking at one of the doughnut conditions that wraps around the lower leg to offload which I think will definitely do a good job. With that being said I think we definitely need to address this issue on the left before it becomes a wound. Patient is not having significant pain. 04/19/18 on evaluation today patient appears to be doing excellent in regard to the progress she's made with her right lateral ankle ulcer. The left ankle region which did show evidence of a deep tissue injury seems to be resolving there's little fluid noted underneath and a blister there's nothing open at this point in time overall I feel like this is progressing nicely which is good news. She does not seem to be having significant discomfort at this point which is also good news. 04/25/18-She is here in follow up evaluation for bilateral lateral malleolar ulcers. The right lateral malleolus ulcer with pale subcutaneous tissue exposure, central area of ulcer with tendon/periosteum exposed. The left lateral malleolus ulcer now with central area of nonviable tissue, otherwise deep tissue injury. She is wearing compression wraps to the left lower extremity, she will place the right lower extremity compression wraps on when she gets home. She will be out of town over the weekend and return next week and follow-up appointment. She completed  her doxycycline this morning 05/03/18 on evaluation today patient appears to be doing very well in regard to her right lateral ankle ulcer in general. At least she's showing some signs of improvement in this regard. Unfortunately she has some additional injury to the left lateral malleolus region which appears to be new likely even over the past several days. Again this determination is based on the overall appearance. With that being said the patient is obviously frustrated about this currently. 05/10/18-She is here in follow-up evaluation for bilateral lateral malleolar ulcers. She states she has purchased offloading shoes/boots and they will arrive tomorrow. She was asked to bring them in the office at next week's appointment so her provider is aware of product being utilized. She continues to sleep on right or left side, she has been encouraged to sleep on her back. The right lateral malleolus ulcer is precariously close to peri-osteum; will order xray. The left lateral malleolus ulcer is improved. Will switch back to santyl; she will follow up next week. 05/17/18 on evaluation today patient actually appears to be doing very well in regard to her malleolus her ulcers compared to last time I saw them. She does not seem to have as much in the way of contusion at this point which  is great news. With that being said she does continue to have discomfort and I do believe that she is still continuing to benefit from the offloading/pressure reducing boots that were recommended. I think this is the key to trying to get this to heal up completely. 05/24/18 on evaluation today patient actually appears to be doing worse at this point in time unfortunately compared to her last week's evaluation. She is having really no increased pain which is good news unfortunately she does have more maceration in your theme and noted surrounding the right lateral ankle the left lateral ankle is not really is erythematous I do  not see signs of the overt cellulitis on that side. Unfortunately the wounds do not seem to have shown any signs of improvement since the last evaluation. She also has significant swelling especially on the right compared to previous some of this may be due to infection however also think that she may be served better while she has these wounds by compression wrapping versus continuing to use the Juxta-Lite for the time being. Especially with the amount of drainage that she is experiencing at this point. No fevers, chills, nausea, or vomiting noted at this time. 05/31/18 on evaluation today patient appears to actually be doing better in regard to her right lateral lower extremity ulcer specifically on the malleolus region. She has been tolerating the antibiotic without complication. With that being said she still continues to have issues but a little bit of redness although nothing like she what she was experiencing previous. She still continues to pressure to her ankle area she did get the problem on offloading boots unfortunately she will not wear them she states there too uncomfortable and she can't get in and out of the bed. Nonetheless at this point her wounds seem to be continually getting worse which is not what we want I'm getting somewhat concerned about her progress and how things are going to proceed if we do not intervene in some way shape or form. I therefore had a very lengthy conversation today about offloading yet again and even made a specific suggestion for switching her to a memory foam mattress and even gave the information for a specific one that they could look at getting if it was something that they were interested in considering. She does not want to be considered for a hospital bed air mattress although honestly insurance would not cover it that she does not have any wounds on her trunk. 06/14/18 on evaluation today both wounds over the bilateral lateral malleolus her ulcers  appear to be doing better there's no evidence of pressure injury at this point. She did get the foam mattress for her bed and this does seem to have been extremely beneficial for her in my pinion. Her daughter states that she is having difficulty getting out of bed because of how soft it is. The patient also relates this to be. Nonetheless I do feel like she's actually doing better. Unfortunately right after and around the time she was getting the mattress she also sustained a fall when she got up to go pick up the phone and ended up injuring her right elbow she has 18 sutures in place. We are not caring for this currently although home health is going to be taking the sutures out shortly. Nonetheless this may be something that we need to evaluate going forward. It depends on ROSAISELA, JAMROZ ([id number]) how well it has or has not healed in the end.  She also recently saw an orthopedic specialist for an injection in the right shoulder just before her fall unfortunately the fall seems to have worsened her pain. 06/21/18 on evaluation today patient appears to be doing about the same in regard to her lateral malleolus ulcers. Both appear to be just a little bit deeper but again we are clinging away the necrotic and dead tissue which I think is why this is progressing towards a deeper realm as opposed to improving from my measurement standpoint in that regard. Nonetheless she has been tolerating the dressing changes she absolutely hates the memory foam mattress topper that was obtained for her nonetheless I do believe this is still doing excellent as far as taking care of excess pressure in regard to the lateral malleolus regions. She in fact has no pressure injury that I see whereas in weeks past it was week by week I was constantly seeing new pressure injuries. Overall I think it has been very beneficial for her. 07/03/18; patient arrives in my clinic today. She has deep punched out areas over her  bilateral lateral malleoli. The area on the right has some more depth. We spent a lot of time today talking about pressure relief for these areas. This started when her daughter asked for a prescription for a memory foam mattress. I have never written a prescription for a mattress and I don't think insurances would pay for that on an ordinary bed. In any case he came up that she has foam boots that she refuses to wear. I would suggest going to these before any other offloading issues when she is in bed. They say she is meticulous about offloading this the rest of the day 07/10/18- She is seen in follow-up evaluation for bilateral, lateral malleolus ulcers. There is no improvement in the ulcers. She has purchased and is sleeping on a memory foam mattress/overlay, she has been using the offloading boots nightly over the past week. She has a follow up appointment with vascular medicine at the end of October, in my opinion this follow up should be expedited given her deterioration and suboptimal TBI results. We will order plain film xray of the left ankle as deeper structures are palpable; would consider having MRI, regardless of xray report(s). The ulcers will be treated with iodoflex/iodosorb, she is unable to safely change the dressings daily with santyl. 07/19/18 on evaluation today patient appears to be doing in general visually well in regard to her bilateral lateral malleolus ulcers. She has been tolerating the dressing changes without complication which is good news. With that being said we did have an x-ray performed on 07/12/18 which revealed a slight loosen see in the lateral portion of the distal left fibula which may represent artifact but underline lytic destruction or osteomyelitis could not be excluded. MRI was recommended. With that being said we can see about getting the patient scheduled for an MRI to further evaluate this area. In fact we have that scheduled currently for August 20  19,019. 07/26/18 on evaluation today patient's wound on the right lateral ankle actually appears to be doing fairly well at this point in my pinion. She has made some good progress currently. With that being said unfortunately in regard to the left lateral ankle ulcer this seems to be a little bit more problematic at this time. In fact as I further evaluated the situation she actually had bone exposed which is the first time that's been the case in the bone appear to be  necrotic. Currently I did review patient's note from Dr. Bunnie Domino office with [facility]. He stated that ABI was 1.26 on the right and 0.95 on the left with good waveforms. Her perfusion is stable not reduced from previous studies and her digital waveforms were pretty good particularly on the right. His conclusion upon review of the note was that there was not much she could do to improve her perfusion and he felt she was adequate for wound healing. His suggestion was that she continued to see [place] and consider a synthetic skin graft if there was no underlying infection. He plans to see her back in six months or as needed. 08/01/18 on evaluation today patient appears to be doing better in regard to her right lateral ankle ulcer. Her left lateral ankle ulcer is about the same she still has bone involvement in evidence of necrosis. There does not appear to be evidence of infection at this time On the right lateral lower extremity. I have started her on the Augmentin she picked this up and started this yesterday. This is to get her through until she sees infectious disease which is scheduled for 08/12/18. 08/06/18 on evaluation today patient appears to be doing rather well considering my discussion with patient's daughter at the end of last week. The area which was marked where she had erythema seems to be improved and this is good news. With that being said overall the patient seems to be making good improvement when it  comes to the overall appearance of the right lateral ankle ulcer although this has been slow she at least is coming around in this regard. Unfortunately in regard to the left lateral ankle ulcer this is osteomyelitis based on the pathology report as well is bone culture. Nonetheless we are still waiting CT scan. Unfortunately the MRI we originally ordered cannot be performed as the patient is a pacemaker which I had overlooked. Nonetheless we are working on the CT scan approval and scheduling as of now. She did go to the hospital over the weekend and was placed on IV Cefzo for a couple of days. Fortunately this seems to have improved the erythema quite significantly which is good news. There does not appear to be any evidence of worsening infection at this time. She did have some bleeding after the last debridement therefore I did not perform any sharp debridement in regard to left lateral ankle at this point. Patient has been approved for a snap vac for the right lateral ankle. 08/14/18; the patient with wounds over her bilateral lateral malleoli. The area on the right actually looks quite good. Been using a snap back on this area. Healthy granulation and appears to be filling in. Unfortunately the area on the left is really problematic. 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 Also noted to have cellulitis. She saw Dr. Novella Olive of infectious disease today and was put on linezolid. We are able to verify this with her pharmacy. She is completed the Augmentin that she was already on. We've been using Iodoflex to this area 08/23/18 on evaluation today patient's wounds both actually appear to be doing better compared to my prior evaluations. Fortunately she showing signs of good improvement in regard to the overall wound status especially where were using the snap vac on the right. In regard to left lateral malleolus the  wound bed actually appears to be much cleaner than  previously noted. I do not feel any phone directly probed during evaluation today and though there is tendon noted this does not appear to be necrotic it's actually fairly good as far as the overall appearance of the tendon is concerned. In general the wound bed actually appears to be doing significantly better than it was previous. Patient is currently in the care of [name] and I did review that note today. He actually has her on two weeks of linezolid and then following the patient will be on 1-2 months of Keflex. That is the plan currently. She has been on antibiotics therapy as prescribed by myself initially starting on July 30, 2018 and has been on that continuously up to this point. 08/30/18 on evaluation today patient actually appears to be doing much better in regard to her right lateral malleolus ulcer. She has been tolerating the dressing changes specifically the snap vac without complication although she did have some issues with the seal currently. Apparently there was some trouble with getting it to maintain over the past week past Sunday. Nonetheless overall the wound appears better in regard to the right lateral malleolus region. In regard to left lateral malleolus this actually show some signs of additional granulation although there still tendon noted in the base of the wound this appears to be healthy not necrotic in any way whatsoever. We are considering potentially using a snap vac for the left lateral malleolus as well the product wrap from [employer], [place], was present in the clinic today we're going to see this patient I did have her come in with me after obtaining consent from the patient and her daughter in order to look at the wound and see if there's any recommendation one way or another as to whether or not they felt the snapback could be beneficial for the left lateral malleolus region. But SHASTA, CHINN  ([id number]) the conclusion was that it might be but that this is definitely a little bit deeper wound than what traditionally would be utilized for a snap vac. 09/06/18 on evaluation today patient actually appears to be doing excellent in my pinion in regard to both ankle ulcers. She has been tolerating the dressing changes without complication which is great news. Specifically we have been using the snap vac. In regard to the right ankle I'm not even sure that this is going to be necessary for today and following as the wound has filled in quite nicely. In regard to the left ankle I do believe that we're seeing excellent epithelialization from the edge as well as granulation in the central portion the tendon is still exposed but there's no evidence of necrotic bone and in general I feel like the patient has made excellent progress even compared to last week with just one week of the snap vac. 09/11/18; this is a patient who has wounds on her bilateral lateral malleoli. Initially both of these were deep stage IV wounds in the setting of chronic arterial insufficiency. She has been revascularized. As I understand think she been using snap vacs to both of these wounds however the area on the right became more superficial and currently she is only using it on the left. Using silver collagen on the right and silver collagen under the back on the left I believe 09/19/18 on evaluation today patient actually appears to be doing very well in regard to her lateral malleolus or ulcers bilaterally. She has been tolerating the dressing changes without complication. Fortunately there does  not appear to be any evidence of infection at this time. Overall I feel like she is improving in an excellent manner and I'm very pleased with the fact that everything seems to be turning towards the better for her. This has obviously been a long road. 09/27/18 on evaluation today patient actually appears to be doing very well in  regard to her bilateral lateral malleolus ulcers. She has been tolerating the dressing changes without complication. Fortunately there does not appear to be any evidence of infection at this time which is also great news. No fevers, chills, nausea, or vomiting noted at this time. Overall I feel like she is doing excellent with the snap vac on the left malleolus. She had 40 mL of fluid collection over the past week. 10/04/18 on evaluation today patient actually appears to be doing well in regard to her bilateral lateral malleolus ulcers. She continues to tolerate the dressing changes without complication. One issue that I see is the snap vac on the left lateral malleolus which appears to have sealed off some fluid underlying this area and has not really allowed it to heal to the degree that I would like to see. For that reason I did suggest at this point we may want to pack a small piece of packing strip into this region to allow it to more effectively wick out fluid. 10/11/18 in general the patient today does not feel that she has been doing very well. She's been a little bit lethargic and subsequently is having bodyaches as well according to what she tells me today. With that being said overall she has been concerned with the fact that something may be worsening although to be honest her wounds really have not been appearing poorly. She does have a new ulcer on her left heel unfortunately. This may be pressure related. Nonetheless it seems to me to have potentially started at least as a blister I do not see any evidence of deep tissue injury. In regard to the left ankle the snap vac still seems to be causing the ceiling off of the deeper part of the wound which is in turn trapping fluid. I'm not extremely pleased with the overall appearance as far as progress from last week to this week therefore I'm gonna discontinue the snap vac at this point. 10/18/18 patient unfortunately this point has not been  feeling well for the past several days. She was seen by Grayland Ormond her primary care provider who is a Librarian, academic at [facility]. Subsequently she states that she's been very weak and generally feeling malaise. No fevers, chills, nausea, or vomiting noted at this time. With that being said bloodwork was performed at the PCP office on the 11th of this month which showed a white blood cell count of 10.7. This was repeated today and shows a white blood cell count of 12.4. This does show signs of worsening. Coupled with the fact that she is feeling worse and that her left ankle wound is not really showing signs of improvement I feel like this is an indication that the osteomyelitis is likely exacerbating not improving. Overall I think we may also want to check her C-reactive protein and sedimentation rate. Actually did call [facility] office this afternoon while the patient was in the office here with me. Subsequently based on the findings we discussed treatment possibilities and I think that it is appropriate for [place] to go ahead and initiate treatment with doxycycline which I'm going to do.  Subsequently he did agree to see about adding a CRP and sedimentation rate to her orders. If that has not already been drawn to where they can run it they will contact the patient she can come back to have that check. They are in agreement with plan as far as the patient and her daughter are concerned. Nonetheless also think we need to get in touch with Dr. Henreitta Leber office to see about getting the patient scheduled with him as soon as possible. 11/08/18 on evaluation today patient presents for follow-up concerning her bilateral foot and ankle ulcers. I did do an extensive review of her chart in epic today. Subsequently she was seen by [name] he did initiate Cefepime IV antibiotic therapy. Subsequently she had some issues with her PICC line this had to be removed because it was coiled and then  replaced. Fortunately that was now settled. Unfortunately she has continued have issues with her left heel as well as the issues that she is experiencing with her bilateral lateral malleolus regions. I do believe however both areas seem to be doing a little bit better on evaluation today which is good news. No fevers, chills, nausea, or vomiting noted at this time. She actually has an angiogram schedule with Dr. dew on this coming Monday, November 11, 2018. Subsequently the patient states that she is feeling much better especially than what she was roughly 2 weeks ago. She actually had to cancel an appointment because she was feeling so poorly. No fevers, chills, nausea, or vomiting noted at this time. 11/15/18 on evaluation today patient actually is status post having had her angiogram with Dr. dew Monday, four days ago. 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 He had to go and work on several areas of the vasculature fortunately he was able to obtain no more than a 30% residual stenosis throughout post procedure. I reviewed this note today. I think this will definitely help with healing at this time. Fortunately there does not appear to be any signs of infection and I do feel like ratio already has a better appearance to it. 11/22/18 upon evaluation today patient actually appears to be doing very well in regard to her wounds in general. The right lateral malleolus looks excellent the heel looks better in the left lateral malleolus also appears to be doing a little better. With that being said the right second toe actually appears to be open and training we been watching this is been dry and stable but now is open. 12/03/2018 Seen today for follow-up and management of multiple bilateral lower extremity wounds. New pressure injury of the great toe which is closed at this time. Wound of the right distal second toe appears larger today with deep undermining and a pocket of  fluid present within the undermining region. Left and right malleolus is wounds are stable today with no signs and symptoms of infection.Denies any needs or concerns during exam today. 12/13/18 on evaluation today patient appears to be doing somewhat better in regard to her left heel ulcer. She also seems to be completely healed in regard to the right lateral malleolus ulcer. The left malleolus ulcer is smaller what unfortunately the wounds which are new over the first and second toes of the right foot are what are most concerning at this point especially the second. Both areas did require sharp debridement today. 12/20/18 on evaluation today patient's wound actually appears to be doing better in regard to  left lateral ankle and her right lateral ankle continues to remain healed. The hill ulcer on the left is improved. She does have improvement noted as well in regard to both toe ulcers. Overall I'm very pleased in this regard. No fevers, chills, nausea, or vomiting noted at this time. DEANETTE, TULLIUS ([id number]) 12/23/18 on evaluation today patient is seen after she had her toenails trimmed at the podiatrist office due to issues with her right great toe. There was what appeared to be dark eschar on the surface of the wound which had her in the podiatrist concerned. Nonetheless as I remember that during the last office visit I had utilize silver nitrate of this area I was much less concerned about the situation. Subsequently I was able to clean off much of this tissue without any complication today. This does not appear to show any signs of infection and actually look somewhat better compared to last time post debridement. Her second toe on the right foot actually had callous over and there did appear still be some fluid underneath this that would require debridement today. 12/27/18 on evaluation today patient actually appears to be showing signs of improvement at all locations. Even the left lateral  ankle although this is not quite as great as the other sites. Fortunately there does not appear to be any signs of infection at this time and both of her toes on the right foot seem to be showing signs of improvement which is good news and very pleased in this regard. 01/03/19 on evaluation today patient appears to be doing better for the most part in regard to her wounds in particular. There does not appear to be any evidence of infection at this time which is good news. Fortunately there is no sign of really worsening anywhere except for the right great toe which she does have what appears to be a bruise/deep tissue injury which is very superficial and already resolving. I'm not sure where this came from I questioned her extensively and she does not recall what may have happened with this. Other than that the patient seems to be doing well even the left lateral ankle ulcer looks good and is getting smaller. 01/10/19 on evaluation today patient appears to be doing well in regard to her left heel wound and both of her toe wounds. Overall I feel like there is definitely improvement here and I'm happy in that regard. With that being said unfortunately she is having issues with the left lateral malleolus ulcer which unfortunately still has a lot of depth to it. This is gonna be a very difficult wound for [place] to be able to truly get to heal. I may want to consider some type of skin substitute to see if this would be of benefit for her. I'll discuss this with her more the next visit most likely. This was something I thought about more at the end of the visit when I was Artie out of the room and the patient had been discharged. 01/17/19 on evaluation today patient appears to be doing very well in regard to her wounds in general. She's been making excellent progress at this time. Fortunately there's no sign of infection at this time either. No fevers, chills, nausea, or vomiting noted at this time. The biggest  issue is still her left lateral malleolus where it appears to be doing well and is getting smaller but still shows a small corner where this is deeper and goes down into what appears to  be the joint space. Nonetheless this is taking much longer to heal although it still looks better in smaller than previous evaluations. 01/24/19 on evaluation today patient's wounds actually appear to be doing rather well in general overall. She did require some sharp debridement in regard to the right great toe but everything else appears to be doing excellent no debridement was even necessary. No fevers, chills, nausea, or vomiting noted at this time. 01/31/19 on evaluation today patient actually appears to be doing much better in regard to her left foot wound on the heel as well as the ankle. The right great toe appears to be a little bit worse today this had callous over and trapped a lot of fluid underneath. Fortunately there's no signs of infection at any site which is great news. 02/07/19 on evaluation today patient actually appears to be doing decently well in regard to all of her ulcers at this point. No sharp debridement was required she is a little bit of hyper granulation in regard to the left lateral ankle as well as the left heel but the hill itself is almost completely healed which is excellent news. Overall been very pleased in this regard. 02/14/19 on evaluation today patient actually appears to be doing very well in regard to her ulcers on the right first toe, left lateral malleolus, and left heel. In fact the heel is almost completely healed at this point. The patient does not show any signs of infection which is good news. Overall very pleased with how things have progressed. 04/18/19 Telehealth Evaluation During the COVID-19 National Emergency: Verbal Consent: Obtained from patient Allergies: reviewed and the active list is current. Medication changes: patient has no current medication  changes. COVID-19 Screening: 1. Have you traveled internationally or on a cruise ship in the last 14 dayso No 2. Have you had contact with someone with or under investigation for COVID-19o No 3. Have you had a fever, cough, sore throat, or experiencing shortness of breatho No on evaluation today actually did have a visit with this patient through a telehealth encounter with her home health nurse. Subsequently it was noted that the patient actually appears to be doing okay in regard to her wounds both the right great toe as well as the left lateral malleolus have shown signs of improvement although this in your theme around the left lateral malleolus there eschar coverings for both locations. The question is whether or not they are actually close and whether or not home health needs to discharge the patient or not. Nonetheless my concern is this point obviously is that without actually seeing her and being able to evaluate this directly I cannot ensure that she is completely healed which is the question that I'm being asked. 04/22/19 on evaluation today patient presents for her first evaluation since last time I saw her which was actually February 14, 2019. I did do a telehealth visit last week in which point it was questionable whether or not she may be healed and had to bring her in today for confirmation. With that being said she does seem to be doing quite well at this point which is good news. There does not appear to be any drainage in the deed I believe her wounds may be healed. Readmission: 09/04/2019 on evaluation today patient appears to be doing unfortunately somewhat more poorly in regard to her left foot ulcer secondary to a wound that began on 08/21/2019 at least when she first noticed this. Fortunately she has not  had any evidence of active infection at this time. Systemically. I also do not necessarily see any evidence of infection at the blister/wound site on the first metatarsal head  plantar aspect. This almost appears to be something that may have just rubbed inappropriately causing this to breakdown. They did not want a wait too long to come in to be seen as again she had significant issues in the past with wounds that took quite a while to heal in fact it was close to 2 years. Nonetheless this does not appear to be quite that bad but again we do need to remove some of the necrotic tissue from the surface of the wound to tell exactly the extent. She does not appear to have any significant arterial disease at this point and again her last ABIs and TBI's are recorded above in the alert section her left ABI was 1.27 with a TBI of 0.72 to the right ABI 1.08 with a TBI of 0.39. Other than this the patient has been doing quite KARENNA, ROMANOFF ([id number]) well since I last saw her and that was in May 2020. 09/11/2019 on evaluation today patient appeared to be doing very well with regard to her plantar foot ulcer on the left. In fact this appears to be almost completely healed which is awesome. That is after just 1 week of intervention. With that being said there is no signs of active infection at this time. 09/18/2019 on evaluation today patient actually appears to be doing excellent in fact she is completely healed based on what I am seeing at this point. Fortunately there is no signs of active infection at this time and overall patient is very pleased to hear that this area has healed so quickly. Readmission: 05/13/2021 upon evaluation today this patient presents for reevaluation here in the clinic. This is a wound that actually we previously took care of. She had 1 on the right ankle and the left the left turned out to be be harder due to to heal but nonetheless is doing great at this point as the right that has reopened and it was noted first just several weeks ago with a scab over it and came off in just the past few days. Fortunately there does not appear to be any obvious  evidence of significant active infection at this time which is great news. No fevers, chills, nausea, vomiting, or diarrhea. 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 There does not appear to be any signs of this interfering in any way with her wound. She does have swelling we previously had compression socks for her ordered but again it does not look like she wears these on a regular basis by any means. 05/26/2021 upon evaluation today patient appears to be doing well with regard to her wound which is actually showing signs of excellent improvement. There does not appear to be any signs of active infection which is great news and overall very pleased with where things stand today. No fevers, chills, nausea, vomiting, or diarrhea. 06/02/2021 upon evaluation today patient's wound actually showing signs of excellent improvement. Fortunately there does not appear to be any signs of active infection which is great news. I think the patient is making good progress with regard to her wounds in general. 06/09/2021 upon evaluation today patient appears to be doing excellent in regard to her wounds currently. Fortunately there does not appear to be any signs of active infection  which is great news. No fevers, chills, nausea, vomiting, or diarrhea. Overall extremely pleased with where things stand today. I think the patient is making excellent progress. 06/16/2021 upon evaluation today patient appears to be doing well in regard to her wound. This is going require little bit of debridement today and that was discussed with the patient. Otherwise she seems to be doing quite well and I am actually very pleased with where things stand at this point. No fevers, chills, nausea, vomiting, or diarrhea. 06/23/2021 upon evaluation today patient appears to be doing well with regard to her wounds. She has been tolerating the dressing changes without complication. Fortunately  there does not appear to be any evidence of infection and she has not had air in her home which she actually lives at an assisted living that got fixed this morning. With that being said because of that her wrap has been extremely hot and bothersome for her over the past week. 06/30/2021 upon evaluation today patient is actually making excellent progress in regard to her ankle ulcer. She has been tolerating the dressing changes without complication and overall extremely pleased with where things stand there does not appear to be any evidence of active infection which is great news. No fevers, chills, nausea, vomiting, or diarrhea. 07/07/21 upon evaluation today patients and culture on the right actually appears to be doing quite well. There does not appear to be any signs of infection and overall very pleased with where things stand today. No fevers, chills, nausea, or vomiting noted at this time. 07/14/2021 unfortunately the patient today has some evidence of deep tissue injury and pressure getting to the ankle region. Again I am not exactly sure what is going on here but this is very similar to issues that we have had in the past. I explained to the patient that she needs to be very mindful of exactly what is happening I think sleeping in bed is probably the main issue here although there could be other culprits I am not sure what else would potentially lead to this kind of a problem for her. 07/21/2021 upon evaluation today patient's wound actually showing signs of improvement compared to last week. Fortunately there does not appear to be any signs of active infection which is great news and overall very pleased with where things stand in that regard. With that being said I do believe that she is continuing to show signs of overall of getting better although I think this is still basically about what we were 2 weeks ago due to the worsening and now improvement. 07/28/2021 upon evaluation today patient  appears to be doing well with regard to her wound. She does have some slough buildup on the surface of the wound which I would have to manage today. Fortunately there is no sign of active infection at this time. No fevers, chills, nausea, vomiting, or diarrhea. 08/04/2021 upon evaluation today patient appears to be doing about the same in regard to her wound. To be perfectly honest I am beginning to be a little bit concerned about the overall appearance of the wound bed. I do think possibly taking a sample right around the margin of the wound could be beneficial for her as far as identifying anything such as an inflammatory process or to be honest even a skin tag cancer type process that may be of concern here. Fortunately there does not appear to be any evidence of active infection at this time which is great news she  is not having any pain also great news. 08/11/2021 upon evaluation today patient appears to be doing well with regard to her wound. The good news is I did review her biopsy results and it showed some inflammatory mixed findings but nothing that appeared to be malignant which is great news. Overall this is more of a chronic venous stasis type issue which again is more what we have been treating. Nonetheless I just wanted to make sure before going forward that there was not anything more untoward going on at this point. 08/18/2021 upon evaluation today patient appears to be doing well with regard to her ankle ulcer. Fortunately there does not appear to be any signs of active infection at this time which is great overall wound is dramatically improved compared to last week. Since last week I have actually placed her on doxycycline and subsequently this is a good option as far as the findings are concerned at this point. 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  the doxycycline is doing that. There does not appear to be any evidence of active infection systemically which is great news. 08/25/2021 upon evaluation today patient appears to be doing well with regard to her wound. I feel like we are finally get back on track as far as healing is concerned I am much happier with the overall appearance today. I do think that she is tolerating the dressing changes without complication which is great news. We have been using Hydrofera Blue which I think is a good option. The good news is she is also doing great in Roe, DYANI BABEL. ([id number]) regard to her compression sock on the left which is a zipper compression that seems to be doing a great job keeping her edema under good control. 09/01/2021 upon evaluation today patient appears to be doing well with regard to her wound. Infection seems to be under much better control which is great news and very pleased in that regard. Fortunately there does not appear to be any signs of infection currently. 09/08/2021 upon evaluation today patient actually appears to be making good progress in regard to her wound. She has been tolerating the dressing changes without complication. Fortunately there does not appear to be any evidence of active infection at this time which is great news as well. No fevers, chills, nausea, vomiting, or diarrhea. 09/22/2021 upon evaluation today patient appears to be doing well with regard to her wound although is very slow to heal. We have not looked into Apligraf yet I think that is something that we should see about doing. 09/29/2021 upon evaluation today patient's wound is actually showing signs of doing about the same. I am not seeing any evidence of worsening but also no significant evidence of improvement. We did gain approval for the organogenesis products all except for Apligraf as covered by her insurance. With that being said I do think that we can go ahead and proceed with the NuShield  if the patient and her family in agreement of the plan I discussed that with him today she does have a 20% coinsurance which we also discussed. 10/06/2021 upon evaluation today patient appears to unfortunately be doing a little bit worse she appears to be infected based on what I am seeing. Fortunately there does not appear to be any signs of active infection at this time which is great  news. Unfortunately it does appear to be some evidence of around the wound edge indicated by way of erythema and warmth as well as redness 10/13/2021 upon evaluation today patient actually appears to be doing excellent in regard to her ankle ulcer compared to what it was. 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 I think were fine to go ahead and apply the NuShield today. 10/20/2021 upon evaluation today patient appears to be doing excellent in fact the NuShield seems to have done all some for her thus far. I am actually very pleased with where we stand and overall I think that she is making great progress. There is no evidence of active infection at this time. 11/23; patient presents for follow-up. She has no issues or complaints today. She denies signs of infection. She reports stability in her wound healing. 11/03/2021 upon evaluation today patient appears to be doing well with regard to her wounds. She has been tolerating the dressing changes without complication. Fortunately there is no signs of active infection at this time. 11/10/2021 upon evaluation today patient appears to be doing well with regard to her right ankle which is actually showing signs of improvement with the NuShield I am very pleased. Subsequently in regards to the left ankle this appears to be doing okay with no evidence of issue here either. With  that being said she has a significant contusion on the left leg from having fallen 2 days ago. She tells me that she was using her walker going to the closet and then when she got to the closet turned around to get something out at which point she fell according to the story. Nonetheless she does have a hematoma just below her knee on the anterior portion of her shin. My hope is that this will not open until wound although we do need I think Some compression over it also think that we need to have her use an ice as well to help with the swelling and prevent this from getting worse. The last thing she needs is a wound opened up here. 11/17/2021 upon evaluation patient's right ankle actually showing signs of improvement based on what I see currently there is a lot of new skin growth coming in which is great news. Nonetheless I do feel like that the patient is showing improvement as well in regard to her left leg with the use of the Tubigrip it swollen but not as bruised as what I would have expected after what I was seeing last week. Nonetheless I do think doubling up on the Tubigrip would probably be beneficial to try to keep some of the edema under better control here. 11/24/2021 upon evaluation today patient appears to be doing excellent in regard to her wound. This is actually measuring significantly smaller which is great news. Fortunately I do not see any signs of active infection locally nor systemically at this point. Overall I am very pleased with how the NuShield is doing. 12/30; wound bed looks healthy however not much change in overall wound volume. We applied Nushield again today in the standard fashion 12/08/2021 upon evaluation today patient's wound actually showing signs of good improvement and I am actually very pleased with where we stand today as well. I do not see any evidence of active infection locally  nor systemically which is great news. Unfortunately she has been having  some issues with stomach upset and diarrhea today. 12/15/2021 upon evaluation today patient appears to be doing excellent in regard to her wound. There is a little bit of dry skin raised up around the edges of the wound but fortunately nothing too significant at this point. Fortunately I do not see any evidence of active infection either which is great news. No fevers, chills, nausea, vomiting, or diarrhea. 12/29/2021 upon evaluation today patient appears to be doing well with regard to her wound. In general I feel like she is getting very close to complete resolution. I think that she is making good progress here as well. Overall I do not see any signs of infection and very little of this appears to actually be open. 01/12/2022 upon evaluation today patient appears to be doing a little bit worse in regard to her wound. It appears that the collagen actually trapped fluid underneath the collagen which got hard and subsequently caused the fluid to back up. This patient has made the wound appear to be larger than what it was previous. With that being said I do not see any signs of obvious infection is not warm to touch and not significantly erythematous either which is good news. Nonetheless I do think that we will need to continue to keep an eye on this. Probably not go put on antibiotic this week but I will be too far from doing so she is actually on a Z-Pak right now I do not want to really double up on antibiotics. 01/26/2022 upon evaluation today patient's wound is showing signs of improvement although this is slow to turn back around. Fortunately there does not appear to be any evidence of active infection locally or systemically which is great news and overall I am extremely pleased with where we stand today. No fevers, chills, nausea, vomiting, or diarrhea. 02/02/2022 upon evaluation today patient appears to be doing well with regard to her wound. She has been tolerating the dressing changes  without complication. Fortunately there does not appear to be any signs of active infection locally nor systemically at this time which is great news. No fevers, chills, nausea, vomiting, or diarrhea. CHATTIE, GREESON ([id number]) 3/9; patient presents for follow-up. She has no issues or complaints today. She denies signs of infection. Objective Constitutional Vitals Time Taken: 10:21 AM, Height: 62 in, Weight: 150 lbs, BMI: 27.4, Temperature: 98.2 F, Pulse: 80 bpm, Respiratory Rate: 18 breaths/min, Blood Pressure: 150/78 mmHg. General Notes: Right ankle: To the lateral malleolus there is an open wound with granulation tissue and nonviable tissue present. No surrounding signs of infection. Integumentary (Hair, Skin) Wound #7 status is Open. Original cause of wound was Gradually Appeared. The date acquired was: 05/12/2021. The wound has been in treatment 38 weeks. The wound is located on the Right,Lateral Malleolus. The wound measures 1cm length x 1.2cm width x 0.2cm depth; 0.942cm^2 area and 0.188cm^3 volume. There is Fat Layer (Subcutaneous Tissue) exposed. There is no tunneling or undermining noted. There is a medium amount of serosanguineous drainage noted. There is medium (34-66%) red granulation within the wound bed. There is a medium (34-66%) amount of necrotic tissue within the wound bed including Adherent Slough. Assessment Active Problems ICD-10 Pressure ulcer of right ankle, stage 3 Lymphedema, not elsewhere classified Venous insufficiency (chronic) (peripheral) Presence of cardiac pacemaker Long term (current) use of anticoagulants Patient's wound is stable. No surrounding signs of infection. I debrided nonviable tissue.  I recommended continue with current therapy of Medihoney infused silver alginate under 3 layer compression. Follow-up in 1 week. Procedures Wound #7 Pre-procedure diagnosis of Wound #7 is a Pressure Ulcer located on the Right,Lateral Malleolus . There was a  Excisional Skin/Subcutaneous Tissue Debridement with a total area of 1.2 sq cm performed by Kalman Shan, MD. With the following instrument(s): Curette to remove Viable and Non-Viable tissue/material. Material removed includes Subcutaneous Tissue and Slough and. No specimens were taken. A time out was conducted at 10:45, prior to the start of the procedure. A Moderate amount of bleeding was controlled with Pressure. The procedure was tolerated well with a pain level of 0 throughout and a pain level of 0 following the procedure. Post Debridement Measurements: 1cm length x 1.2cm width x 0.2cm depth; 0.188cm^3 volume. Post debridement Stage noted as Category/Stage III. Character of Wound/Ulcer Post Debridement is improved. Post procedure Diagnosis Wound #7: Same as Pre-Procedure Plan Follow-up Appointments: Return Appointment in 1 week. Bathing/ Shower/ Hygiene: TONNI, MANSOUR ([id number]) May shower with wound dressing protected with water repellent cover or cast protector. Edema Control - Lymphedema / Segmental Compressive Device / Other: Optional: One layer of unna paste to top of compression wrap (to act as an anchor). - and at toes Patient to wear own compression stockings. Remove compression stockings every night before going to bed and put on every morning when getting up. - left leg Elevate, Exercise Daily and Avoid Standing for Long Periods of Time. Elevate legs to the level of the heart and pump ankles as often as possible Elevate leg(s) parallel to the floor when sitting. WOUND #7: - Malleolus Wound Laterality: Right, Lateral Cleanser: Soap and Water 1 x Per Week/30 Days Discharge Instructions: Gently cleanse wound with antibacterial soap, rinse and pat dry prior to dressing wounds Primary Dressing: Algicell Calcium Alginate Dressing 2x2 (in/in) 1 x Per Week/30 Days Primary Dressing: Medihoney - Gel, 1.5 (oz), tube 1 x Per Week/30 Days Secondary Dressing: Gauze 1 x Per  Week/30 Days Discharge Instructions: As directed: dry, moistened with saline or moistened with Dakins Solution Compression Wrap: 3-LAYER WRAP - Profore Lite LF 3 Multilayer Compression Bandaging System 1 x Per Week/30 Days Discharge Instructions: Apply 3 multi-layer wrap as prescribed. 1. In office sharp debridement 2. Medihoney infused silver alginate under 3 layer compression 3. Follow-up in 1 week Electronic Signature(s) Signed: 02/09/2022 10:54:48 AM By: Kalman Shan DO Entered By: Kalman Shan on 02/09/2022 10:53:22 Palazzi, [name] ([id number]) -------------------------------------------------------------------------------- SuperBill Details Patient Name: [name] Date of Service: 02/09/2022 Medical Record Number: [id number] Patient Account Number: [id number] Date of Birth/Sex: 1923-06-23 (88 y.o. F) Treating RN: Carlene Coria Primary Care Provider: Adrian Prows Other Clinician: Referring Provider: Adrian Prows Treating Provider/Extender: Yaakov Guthrie in Treatment: 87 Diagnosis Coding ICD-10 Codes Code Description [phone] Pressure ulcer of right ankle, stage 3 I89.0 Lymphedema, not elsewhere classified I87.2 Venous insufficiency (chronic) (peripheral) Z95.0 Presence of cardiac pacemaker Z79.01 Long term (current) use of anticoagulants Facility Procedures CPT4 Code: [id number] Description: 45809 - DEB SUBQ TISSUE 20 SQ CM/< Modifier: Quantity: 1 CPT4 Code: Description: ICD-10 Diagnosis Description 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, stage 3 I87.2 Venous insufficiency (chronic) (peripheral) I89.0 Lymphedema, not elsewhere classified Modifier: Quantity: Physician Procedures CPT4 Code: [id number] Description: 05397 - WC PHYS SUBQ TISS 20 SQ CM Modifier: Quantity: 1 CPT4 Code: Description: ICD-10 Diagnosis Description 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, stage 3 I87.2 Venous insufficiency (chronic) (peripheral) I89.0 Lymphedema, not  elsewhere classified Modifier: Quantity: Electronic  Signature(s) Signed: 02/09/2022 10:54:48 AM By: Kalman Shan DO Entered By: Kalman Shan on 02/09/2022 10:53:58

## 2022-02-16 ENCOUNTER — Encounter: Payer: Medicare Other | Admitting: Physician Assistant

## 2022-02-16 ENCOUNTER — Other Ambulatory Visit: Payer: Self-pay

## 2022-02-16 DIAGNOSIS — L89513 Pressure ulcer of right ankle, stage 3: Secondary | ICD-10-CM | POA: Diagnosis not present

## 2022-02-16 NOTE — Progress Notes (Addendum)
[name], [name] ([id number]) ?Visit Report for 02/16/2022 ?Chief Complaint Document Details ?Patient Name: [name], [name] ?Date of Service: 02/16/2022 10:15 AM ?Medical Record Number: [id number] ?Patient Account Number: [id number] ?Date of Birth/Sex: 1923-10-25 (88 y.o. F) ?Treating RN: Carlene Coria ?Primary Care Provider: Adrian Prows Other Clinician: ?Referring Provider: Adrian Prows ?Treating Provider/Extender: Jeri Cos ?Weeks in Treatment: 39 ?Information Obtained from: Patient ?Chief Complaint ?Right foot ulcer ?Electronic Signature(s) ?Signed: 02/16/2022 10:34:22 AM By: Worthy Keeler PA-C ?Entered By: Worthy Keeler on 02/16/2022 10:34:22 ?BRENA, WINDSOR ([id number]) ?-------------------------------------------------------------------------------- ?Debridement Details ?Patient Name: [name], [name] ?Date of Service: 02/16/2022 10:15 AM ?Medical Record Number: [id number] ?Patient Account Number: [id number] ?Date of Birth/Sex: [birth year] (88 y.o. F) ?Treating RN: Carlene Coria ?Primary Care Provider: Adrian Prows Other Clinician: ?Referring Provider: Adrian Prows ?Treating Provider/Extender: Jeri Cos ?Weeks in Treatment: 39 ?Debridement Performed for ?Wound #7 Right,Lateral Malleolus ?Assessment: ?Performed By: Physician Tommie Sams., PA-C ?Debridement Type: Debridement ?Level of Consciousness (Pre- ?Awake and Alert ?procedure): ?Pre-procedure Verification/Time Out ?Yes - 10:47 ?Taken: ?Start Time: 10:47 ?Total Area Debrided (L x W): 0.9 (cm) x 1.2 (cm) = 1.08 (cm?) ?Tissue and other material ?Viable, Non-Viable, Subcutaneous, Skin: Dermis , Skin: Epidermis, Biofilm ?debrided: ?Level: Skin/Subcutaneous Tissue ?Debridement Description: Excisional ?Instrument: Curette ?Bleeding: Minimum ?Hemostasis Achieved: Pressure ?End Time: 10:54 ?Procedural Pain: 0 ?Post Procedural Pain: 0 ?Response to Treatment: Procedure was tolerated well ?Level of Consciousness (Post- ?Awake and  Alert ?procedure): ?Post Debridement Measurements of Total Wound ?Length: (cm) 0.9 ?Stage: Category/Stage III ?Width: (cm) 1.2 ?Depth: (cm) 0.2 ?Volume: (cm?) 0.17 ?Character of Wound/Ulcer Post Debridement: Improved ?Post Procedure Diagnosis ?Same as Pre-procedure ?Electronic Signature(s) ?Signed: 02/17/2022 4:20:50 PM By: Carlene Coria RN ?Signed: 02/17/2022 5:45:40 PM By: Worthy Keeler PA-C ?Entered By: Carlene Coria on 02/16/2022 10:53:57 ?TALITA, RECHT ([id number]) ?-------------------------------------------------------------------------------- ?HPI Details ?Patient Name: [name], [name] ?Date of Service: 02/16/2022 10:15 AM ?Medical Record Number: [id number] ?Patient Account Number: [id number] ?Date of Birth/Sex: October 27, 1923 (88 y.o. 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 Dr. Amalia Hailey the podiatrist saw her on 07/20/2017 and sent her to the ?wound center for possible hyperbaric oxygen therapy. ?past medical history of peripheral vascular disease, varicose veins, status post appendectomy, basal cell carcinoma excision from the left leg, ?cholecystectomy, pacemaker placement, right lower extremity angiography done by Dr. dew in March 2017 with placement of a stent. there is ?also note of a successful ablation of the right small saphenous vein done which was reviewed by ultrasound on 10/24/2016. the patient had a ?right small saphenous vein ablation done on 10/20/2016. The patient has never been a smoker. ?She has been seen by Dr. Corene Cornea dew the vascular surgeon who most recently saw her on 06/15/2017 for evaluation of  ongoing problems with ?right leg swelling. She had a lower extremity arterial duplex examination done(02/13/17) which showed patent distal right superficial femoral ?artery stent and above-the-knee popliteal stent without evidence of restenosis. The ABI was more than 1.3 on the right and more than 1.3 on the ?left. This was consistent with noncompressible arteries due to medial calcification. The right great toe pressure and PPG waveforms are within ?normal limits and the left great toe pressure and PPG waveforms are  decreased. ?he recommended she continue to wear her compression stockings and continue with elevation. She is scheduled to have a noninvasive arterial ?study in the near future ?08/16/2017 -- had a lower extremity arterial duplex examination done which showed patent distal right superficial femoral artery stent and ?above-the-knee popliteal stent without evidence of restenosis. The ABI was more than 1.3 on the right and more than 1.3 on the left. This was ?consistent with noncompressible arteries due to medial calcification. The right great toe pressure and PPG waveforms are within normal limits and ?the left great toe pressure and PPG waveforms are decreased. ?the x-ray of the right ankle has not yet been done ?08/24/2017 -- had a right ankle x-ray -- IMPRESSION:1. No fracture, bone lesion or evidence of osteomyelitis. 2. Lateral soft tissue swelling with ?a soft tissue ulcer. ?she has not yet seen the vascular surgeon for review ?08/31/17 on evaluation today patient's wound appears to be showing signs of improvement. She still with her appointment with vascular in order ?to review her results of her vascular study and then determine if any intervention would be recommended at that time. No fevers, chills, nausea, ?or vomiting noted at this time. She has been tolerating the dressing changes without complication. ?09/28/17 on evaluation today patient's wound appears to show signs of good improvement in  regard to the granulation tissue which is surfacing. ?There is still a layer of slough covering the wound and the posterior portion is still significantly deeper than the anterior nonetheless there has ?been some good sign of things moving towards the better. She is going to go back to Dr. dew for reevaluation to ensure her blood flow is still ?appropriate. That will be before her next evaluation with [place] next week. No fevers, chills, nausea, or vomiting noted at this time. Patient does have ?some discomfort rated to be a 3-4/10 depending on activity specifically cleansing the wound makes it worse. ?10/05/2017 -- the patient was seen by Dr. Lucky Cowboy last week and noninvasive studies showed a normal right ABI with brisk triphasic waveforms ?consistent with no arterial insufficiency including normal digital pressures. The duplex showed a patent distal right SFA stent and the proximal SFA ?was also normal. He was pleased with her test and thought she should have enough of perfusion for normal wound healing. He would see her back ?in 6 months time. ?12/21/17 on evaluation today patient appears to be doing fairly well in regard to her right lateral ankle wound. Unfortunately the main issue that ?she is expansion at this point is that she is having some issues with what appears to be some cellulitis in the right anterior shin. She has also been ?noting a little bit of uncomfortable feeling especially last night and her ankle area. I'm afraid that she made the developing a little bit of an ?infection. With that being said I think it is in the early stages. ?12/28/17 on evaluation today patient's ankle appears to be doing excellent. She's making good progress at this point the cellulitis seems to have ?improved after last week's evaluation. Overall she is having no significant discomfort which is excellent news. She does have an appointment with ?Dr. dew on March 29, 2018 for reevaluation in regard to the stent he placed. She seems  to have excellent blood flow in the right lower extremity. ?01/19/12 on evaluation today patient's wound appears to be doing very well. In fact she does not appear to require debridement at this point, ?there's no

## 2022-02-17 NOTE — Progress Notes (Signed)
[name] ([id number]) ?Visit Report for 02/16/2022 ?Arrival Information Details ?Patient Name: [name], [name] ?Date of Service: 02/16/2022 10:15 AM ?Medical Record Number: [id number] ?Patient Account Number: [id number] ?Date of Birth/Sex: 10-06-1923 (88 y.o. F) ?Treating RN: Carlene Coria ?Primary Care Bristyl Mclees: Adrian Prows Other Clinician: ?Referring Nyhla Mountjoy: Adrian Prows ?Treating Baileigh Modisette/Extender: Jeri Cos ?Weeks in Treatment: 39 ?Visit Information History Since Last Visit ?All ordered tests and consults were completed: No ?Patient Arrived: [name] ?Added or deleted any medications: No ?Arrival Time: 10:14 ?Any new allergies or adverse reactions: No ?Accompanied By: daughter ?Had a fall or experienced change in No ?Transfer Assistance: None ?activities of daily living that may affect ?Patient Identification Verified: Yes ?risk of falls: ?Secondary Verification Process Completed: Yes ?Signs or symptoms of abuse/neglect since last visito No ?Patient Requires Transmission-Based No ?Hospitalized since last visit: No ?Precautions: ?Implantable device outside of the clinic excluding No ?Patient Has Alerts: Yes ?cellular tissue based products placed in the center ?Patient Alerts: Patient on Blood ?since last visit: ?Thinner ?Has Dressing in Place as Prescribed: Yes ?NOT DIABETIC ?Has Compression in Place as Prescribed: Yes ?***ELIQUIS*** ?Pain Present Now: No ?Electronic Signature(s) ?Signed: 02/17/2022 4:20:50 PM By: Carlene Coria RN ?Entered By: Carlene Coria on 02/16/2022 10:18:04 ?ANEKA, FAGERSTROM ([id number]) ?-------------------------------------------------------------------------------- ?Clinic Level of Care Assessment Details ?Patient Name: [name] ?Date of Service: 02/16/2022 10:15 AM ?Medical Record Number: [id number] ?Patient Account Number: [id number] ?Date of Birth/Sex: 04/20/1923 (88 y.o. F) ?Treating RN: Carlene Coria ?Primary Care Tyray Proch: Adrian Prows Other  Clinician: ?Referring Pierrette Scheu: Adrian Prows ?Treating Autry Prust/Extender: Jeri Cos ?Weeks in Treatment: 39 ?Clinic Level of Care Assessment Items ?TOOL 1 Quantity Score ?'[]'$  - Use when EandM and Procedure is performed on INITIAL visit 0 ?ASSESSMENTS - Nursing Assessment / Reassessment ?'[]'$  - General Physical Exam (combine w/ comprehensive assessment (listed just below) when performed on new ?0 ?pt. evals) ?'[]'$  - 0 ?Comprehensive Assessment (HX, ROS, Risk Assessments, Wounds Hx, etc.) ?ASSESSMENTS - Wound and Skin Assessment / Reassessment ?'[]'$  - Dermatologic / Skin Assessment (not related to wound area) 0 ?ASSESSMENTS - Ostomy and/or Continence Assessment and Care ?'[]'$  - Incontinence Assessment and Management 0 ?'[]'$  - 0 ?Ostomy Care Assessment and Management (repouching, etc.) ?PROCESS - Coordination of Care ?'[]'$  - Simple Patient / Family Education for ongoing care 0 ?'[]'$  - 0 ?Complex (extensive) Patient / Family Education for ongoing care ?'[]'$  - 0 ?Staff obtains Consents, Records, Test Results / Process Orders ?'[]'$  - 0 ?Staff telephones HHA, Nursing Homes / Clarify orders / etc ?'[]'$  - 0 ?Routine Transfer to another Facility (non-emergent condition) ?'[]'$  - 0 ?Routine Hospital Admission (non-emergent condition) ?'[]'$  - 0 ?New Admissions / Biomedical engineer / Ordering NPWT, Apligraf, etc. ?'[]'$  - 0 ?Emergency Hospital Admission (emergent condition) ?PROCESS - Special Needs ?'[]'$  - Pediatric / Minor Patient Management 0 ?'[]'$  - 0 ?Isolation Patient Management ?'[]'$  - 0 ?Hearing / Language / Visual special needs ?'[]'$  - 0 ?Assessment of Community assistance (transportation, D/C planning, etc.) ?'[]'$  - 0 ?Additional assistance / Altered mentation ?'[]'$  - 0 ?Support Surface(s) Assessment (bed, cushion, seat, etc.) ?INTERVENTIONS - Miscellaneous ?'[]'$  - External ear exam 0 ?'[]'$  - 0 ?Patient Transfer (multiple staff / Civil Service fast streamer / Similar devices) ?'[]'$  - 0 ?Simple Staple / Suture removal (25 or less) ?'[]'$  - 0 ?Complex Staple / Suture removal  (26 or more) ?'[]'$  - 0 ?Hypo/Hyperglycemic Management (do not check if billed separately) ?'[]'$  - 0 ?Ankle / Brachial Index (ABI) - do not check if billed separately ?Has the  patient been seen at the hospital within the last three years: Yes ?Total Score: 0 ?Level Of Care: ____ ?ELNORIA, LIVINGSTON ([id number]) ?Electronic Signature(s) ?Signed: 02/17/2022 4:20:50 PM By: Carlene Coria RN ?Entered By: Carlene Coria on 02/16/2022 10:54:41 ?DESTANE, SPEAS ([id number]) ?-------------------------------------------------------------------------------- ?Encounter Discharge Information Details ?Patient Name: [name], [name] ?Date of Service: 02/16/2022 10:15 AM ?Medical Record Number: [id number] ?Patient Account Number: [id number] ?Date of Birth/Sex: 1923/07/05 (88 y.o. F) ?Treating RN: Carlene Coria ?Primary Care Erek Kowal: Adrian Prows Other Clinician: ?Referring Merranda Bolls: Adrian Prows ?Treating Maquita Sandoval/Extender: Jeri Cos ?Weeks in Treatment: 39 ?Encounter Discharge Information Items Post Procedure Vitals ?Discharge Condition: Stable ?Temperature (?F): 98.3 ?Ambulatory Status: [name] ?Pulse (bpm): 67 ?Discharge Destination: Home ?Respiratory Rate (breaths/min): 18 ?Transportation: Private Auto ?Blood Pressure (mmHg): 153/74 ?Accompanied By: self ?Schedule Follow-up Appointment: Yes ?Clinical Summary of Care: Patient Declined ?Electronic Signature(s) ?Signed: 02/17/2022 4:20:50 PM By: Carlene Coria RN ?Entered By: Carlene Coria on 02/16/2022 10:56:21 ?KEIMANI, LAUFER ([id number]) ?-------------------------------------------------------------------------------- ?Lower Extremity Assessment Details ?Patient Name: [name] ?Date of Service: 02/16/2022 10:15 AM ?Medical Record Number: [id number] ?Patient Account Number: [id number] ?Date of Birth/Sex: [birth year] (88 y.o. F) ?Treating RN: Carlene Coria ?Primary Care Jala Dundon: Adrian Prows Other Clinician: ?Referring Benedict Kue: Adrian Prows ?Treating  Neliah Cuyler/Extender: Jeri Cos ?Weeks in Treatment: 39 ?Edema Assessment ?Assessed: [Left: No] [Right: No] ?Edema: [Left: Ye] [Right: s] ?Calf ?Left: Right: ?Point of Measurement: 33 cm From Medial Instep 26 cm ?Ankle ?Left: Right: ?Point of Measurement: 10 cm From Medial Instep 17 cm ?Vascular Assessment ?Pulses: ?Dorsalis Pedis ?Palpable: [Right:Yes] ?Electronic Signature(s) ?Signed: 02/17/2022 4:20:50 PM By: Carlene Coria RN ?Entered By: Carlene Coria on 02/16/2022 10:33:39 ?RITAMARIE, ARKIN ([id number]) ?-------------------------------------------------------------------------------- ?Multi Wound Chart Details ?Patient Name: LENNETTE, [name] ?Date of Service: 02/16/2022 10:15 AM ?Medical Record Number: [id number] ?Patient Account Number: [id number] ?Date of Birth/Sex: Apr 14, 1923 (88 y.o. F) ?Treating RN: Carlene Coria ?Primary Care Jamerica Snavely: Adrian Prows Other Clinician: ?Referring Nekoda Chock: Adrian Prows ?Treating Kadie Balestrieri/Extender: Jeri Cos ?Weeks in Treatment: 39 ?Vital Signs ?Height(in): 62 ?Pulse(bpm): 67 ?Weight(lbs): 150 ?Blood Pressure(mmHg): 153/74 ?Body Mass Index(BMI): 27.4 ?Temperature(??F): 98.3 ?Respiratory Rate(breaths/min): 18 ?Photos: [N/A:N/A] ?Wound Location: Right, Lateral Malleolus N/A N/A ?Wounding Event: Gradually Appeared N/A N/A ?Primary Etiology: Pressure Ulcer N/A N/A ?Comorbid History: Cataracts, Congestive Heart Failure, N/A N/A ?Hypertension, Peripheral Arterial ?Disease, Osteoarthritis, Neuropathy ?Date Acquired: 05/12/2021 N/A N/A ?Weeks of Treatment: 34 N/A N/A ?Wound Status: Open N/A N/A ?Wound Recurrence: No N/A N/A ?Measurements L x W x D (cm) 9x1.2x0.2 N/A N/A ?Area (cm?) : 8.482 N/A N/A ?Volume (cm?) : 1.696 N/A N/A ?% Reduction in Area: -655.30% N/A N/A ?% Reduction in Volume: -653.80% N/A N/A ?Classification: Category/Stage III N/A N/A ?Exudate Amount: Medium N/A N/A ?Exudate Type: Serosanguineous N/A N/A ?Exudate Color: red, brown N/A N/A ?Granulation Amount:  Medium (34-66%) N/A N/A ?Granulation Quality: Red N/A N/A ?Necrotic Amount: Medium (34-66%) N/A N/A ?Exposed Structures: ?Fat Layer (Subcutaneous Tissue): N/A N/A ?Yes ?Fascia: No ?Tendon: No ?Muscle: No ?Joint: No ?Bone:

## 2022-02-23 ENCOUNTER — Other Ambulatory Visit: Payer: Self-pay

## 2022-02-23 ENCOUNTER — Ambulatory Visit: Payer: Medicare Other | Admitting: Podiatry

## 2022-02-23 ENCOUNTER — Encounter: Payer: Medicare Other | Admitting: Physician Assistant

## 2022-02-23 DIAGNOSIS — L89513 Pressure ulcer of right ankle, stage 3: Secondary | ICD-10-CM | POA: Diagnosis not present

## 2022-02-23 NOTE — Progress Notes (Signed)
[name] ([id number]) ?Visit Report for 02/23/2022 ?Arrival Information Details ?Patient Name: [name], [name] ?Date of Service: 02/23/2022 10:15 AM ?Medical Record Number: [id number] ?Patient Account Number: [id number] ?Date of Birth/Sex: Apr 18, 1923 (88 y.o. F) ?Treating RN: Carlene Coria ?Primary Care Ely Ballen: Adrian Prows Other Clinician: ?Referring Janeil Schexnayder: Adrian Prows ?Treating Cypress Hinkson/Extender: Jeri Cos ?Weeks in Treatment: 40 ?Visit Information History Since Last Visit ?All ordered tests and consults were completed: No ?Patient Arrived: [name] ?Added or deleted any medications: No ?Arrival Time: 10:13 ?Any new allergies or adverse reactions: No ?Accompanied By: son ?Had a fall or experienced change in No ?Transfer Assistance: None ?activities of daily living that may affect ?Patient Identification Verified: Yes ?risk of falls: ?Secondary Verification Process Completed: Yes ?Signs or symptoms of abuse/neglect since last visito No ?Patient Requires Transmission-Based No ?Hospitalized since last visit: No ?Precautions: ?Implantable device outside of the clinic excluding No ?Patient Has Alerts: Yes ?cellular tissue based products placed in the center ?Patient Alerts: Patient on Blood ?since last visit: ?Thinner ?Has Dressing in Place as Prescribed: Yes ?NOT DIABETIC ?Pain Present Now: No ?***ELIQUIS*** ?Electronic Signature(s) ?Signed: 02/23/2022 4:22:30 PM By: Carlene Coria RN ?Entered By: Carlene Coria on 02/23/2022 10:17:49 ?[name], [name] ([id number]) ?-------------------------------------------------------------------------------- ?Clinic Level of Care Assessment Details ?Patient Name: [name], [name] ?Date of Service: 02/23/2022 10:15 AM ?Medical Record Number: [id number] ?Patient Account Number: [id number] ?Date of Birth/Sex: 01-02-1923 (88 y.o. F) ?Treating RN: Carlene Coria ?Primary Care Leianne Callins: Adrian Prows Other Clinician: ?Referring Krislyn Donnan: Adrian Prows ?Treating  Sybrina Laning/Extender: Jeri Cos ?Weeks in Treatment: 40 ?Clinic Level of Care Assessment Items ?TOOL 1 Quantity Score ?'[]'$  - Use when EandM and Procedure is performed on INITIAL visit 0 ?ASSESSMENTS - Nursing Assessment / Reassessment ?'[]'$  - General Physical Exam (combine w/ comprehensive assessment (listed just below) when performed on new ?0 ?pt. evals) ?'[]'$  - 0 ?Comprehensive Assessment (HX, ROS, Risk Assessments, Wounds Hx, etc.) ?ASSESSMENTS - Wound and Skin Assessment / Reassessment ?'[]'$  - Dermatologic / Skin Assessment (not related to wound area) 0 ?ASSESSMENTS - Ostomy and/or Continence Assessment and Care ?'[]'$  - Incontinence Assessment and Management 0 ?'[]'$  - 0 ?Ostomy Care Assessment and Management (repouching, etc.) ?PROCESS - Coordination of Care ?'[]'$  - Simple Patient / Family Education for ongoing care 0 ?'[]'$  - 0 ?Complex (extensive) Patient / Family Education for ongoing care ?'[]'$  - 0 ?Staff obtains Consents, Records, Test Results / Process Orders ?'[]'$  - 0 ?Staff telephones HHA, Nursing Homes / Clarify orders / etc ?'[]'$  - 0 ?Routine Transfer to another Facility (non-emergent condition) ?'[]'$  - 0 ?Routine Hospital Admission (non-emergent condition) ?'[]'$  - 0 ?New Admissions / Biomedical engineer / Ordering NPWT, Apligraf, etc. ?'[]'$  - 0 ?Emergency Hospital Admission (emergent condition) ?PROCESS - Special Needs ?'[]'$  - Pediatric / Minor Patient Management 0 ?'[]'$  - 0 ?Isolation Patient Management ?'[]'$  - 0 ?Hearing / Language / Visual special needs ?'[]'$  - 0 ?Assessment of Community assistance (transportation, D/C planning, etc.) ?'[]'$  - 0 ?Additional assistance / Altered mentation ?'[]'$  - 0 ?Support Surface(s) Assessment (bed, cushion, seat, etc.) ?INTERVENTIONS - Miscellaneous ?'[]'$  - External ear exam 0 ?'[]'$  - 0 ?Patient Transfer (multiple staff / Civil Service fast streamer / Similar devices) ?'[]'$  - 0 ?Simple Staple / Suture removal (25 or less) ?'[]'$  - 0 ?Complex Staple / Suture removal (26 or more) ?'[]'$  - 0 ?Hypo/Hyperglycemic Management (do not  check if billed separately) ?'[]'$  - 0 ?Ankle / Brachial Index (ABI) - do not check if billed separately ?Has the patient been seen at the hospital within  the last three years: Yes ?Total Score: 0 ?Level Of Care: ____ ?[name] ([id number]) ?Electronic Signature(s) ?Signed: 02/23/2022 4:22:30 PM By: Carlene Coria RN ?Entered ByCarlene Coria on 02/23/2022 10:34:38 ?[name], [name] ([id number]) ?-------------------------------------------------------------------------------- ?Encounter Discharge Information Details ?Patient Name: [name], [name] ?Date of Service: 02/23/2022 10:15 AM ?Medical Record Number: [id number] ?Patient Account Number: [id number] ?Date of Birth/Sex: 01/10/1923 (88 y.o. F) ?Treating RN: Carlene Coria ?Primary Care Seanpaul Preece: Adrian Prows Other Clinician: ?Referring Kloey Cazarez: Adrian Prows ?Treating Senovia Gauer/Extender: Jeri Cos ?Weeks in Treatment: 40 ?Encounter Discharge Information Items Post Procedure Vitals ?Discharge Condition: Stable ?Temperature (?F): 98.7 ?Ambulatory Status: [name] ?Pulse (bpm): 76 ?Discharge Destination: Home ?Respiratory Rate (breaths/min): 18 ?Transportation: Private Auto ?Blood Pressure (mmHg): 145/79 ?Accompanied By: son ?Schedule Follow-up Appointment: Yes ?Clinical Summary of Care: Patient Declined ?Electronic Signature(s) ?Signed: 02/23/2022 4:22:30 PM By: Carlene Coria RN ?Entered ByCarlene Coria on 02/23/2022 10:36:34 ?[name], [name] ([id number]) ?-------------------------------------------------------------------------------- ?Lower Extremity Assessment Details ?Patient Name: [name], [name] ?Date of Service: 02/23/2022 10:15 AM ?Medical Record Number: [id number] ?Patient Account Number: [id number] ?Date of Birth/Sex: 11-06-1923 (88 y.o. F) ?Treating RN: Carlene Coria ?Primary Care Kenndra Morris: Adrian Prows Other Clinician: ?Referring Hanish Laraia: Adrian Prows ?Treating Macaulay Reicher/Extender: Jeri Cos ?Weeks in Treatment: 40 ?Edema  Assessment ?Assessed: [Left: No] [Right: No] ?Edema: [Left: Ye] [Right: s] ?Calf ?Left: Right: ?Point of Measurement: 33 cm From Medial Instep 26 cm ?Ankle ?Left: Right: ?Point of Measurement: 10 cm From Medial Instep 17 cm ?Vascular Assessment ?Pulses: ?Dorsalis Pedis ?Palpable: [Right:Yes] ?Electronic Signature(s) ?Signed: 02/23/2022 4:22:30 PM By: Carlene Coria RN ?Entered ByCarlene Coria on 02/23/2022 10:26:28 ?[name], [name] ([id number]) ?-------------------------------------------------------------------------------- ?Multi Wound Chart Details ?Patient Name: [name], [name] ?Date of Service: 02/23/2022 10:15 AM ?Medical Record Number: [id number] ?Patient Account Number: [id number] ?Date of Birth/Sex: 06/04/1923 (88 y.o. F) ?Treating RN: Carlene Coria ?Primary Care [place] Kretz: Adrian Prows Other Clinician: ?Referring Shyanne Mcclary: Adrian Prows ?Treating Kendall Justo/Extender: Jeri Cos ?Weeks in Treatment: 40 ?Vital Signs ?Height(in): 62 ?Pulse(bpm): 76 ?Weight(lbs): 150 ?Blood Pressure(mmHg): 145/79 ?Body Mass Index(BMI): 27.4 ?Temperature(??F): 98.2 ?Respiratory Rate(breaths/min): 18 ?Photos: [N/A:N/A] ?Wound Location: Right, Lateral Malleolus N/A N/A ?Wounding Event: Gradually Appeared N/A N/A ?Primary Etiology: Pressure Ulcer N/A N/A ?Comorbid History: Cataracts, Congestive Heart Failure, N/A N/A ?Hypertension, Peripheral Arterial ?Disease, Osteoarthritis, Neuropathy ?Date Acquired: 05/12/2021 N/A N/A ?Weeks of Treatment: 40 N/A N/A ?Wound Status: Open N/A N/A ?Wound Recurrence: No N/A N/A ?Measurements L x W x D (cm) 0.9x1x0.2 N/A N/A ?Area (cm?) : 0.707 N/A N/A ?Volume (cm?) : 0.141 N/A N/A ?% Reduction in Area: 37.00% N/A N/A ?% Reduction in Volume: 37.30% N/A N/A ?Classification: Category/Stage III N/A N/A ?Exudate Amount: Medium N/A N/A ?Exudate Type: Serosanguineous N/A N/A ?Exudate Color: red, brown N/A N/A ?Granulation Amount: Medium (34-66%) N/A N/A ?Granulation Quality: Red N/A N/A ?Necrotic  Amount: Medium (34-66%) N/A N/A ?Exposed Structures: ?Fat Layer (Subcutaneous Tissue): N/A N/A ?Yes ?Fascia: No ?Tendon: No ?Muscle: No ?Joint: No ?Bone: No ?Epithelialization: None N/A N/A ?Treatment Notes ?

## 2022-02-23 NOTE — Progress Notes (Addendum)
[name], [name] ([id number]) ?Visit Report for 02/23/2022 ?Chief Complaint Document Details ?Patient Name: [name], [name] ?Date of Service: 02/23/2022 10:15 AM ?Medical Record Number: [id number] ?Patient Account Number: [id number] ?Date of Birth/Sex: 1923/01/18 (88 y.o. F) ?Treating RN: Carlene Coria ?Primary Care Provider: Adrian Prows Other Clinician: ?Referring Provider: Adrian Prows ?Treating Provider/Extender: Jeri Cos ?Weeks in Treatment: 40 ?Information Obtained from: Patient ?Chief Complaint ?Right foot ulcer ?Electronic Signature(s) ?Signed: 02/23/2022 10:29:47 AM By: Worthy Keeler PA-C ?Entered By: Worthy Keeler on 02/23/2022 10:29:47 ?[name], [name] ([id number]) ?-------------------------------------------------------------------------------- ?Debridement Details ?Patient Name: [name], [name] ?Date of Service: 02/23/2022 10:15 AM ?Medical Record Number: [id number] ?Patient Account Number: [id number] ?Date of Birth/Sex: 1923/02/23 (88 y.o. F) ?Treating RN: Carlene Coria ?Primary Care Provider: Adrian Prows Other Clinician: ?Referring Provider: Adrian Prows ?Treating Provider/Extender: Jeri Cos ?Weeks in Treatment: 40 ?Debridement Performed for ?Wound #7 Right,Lateral Malleolus ?Assessment: ?Performed By: Physician Tommie Sams., PA-C ?Debridement Type: Debridement ?Level of Consciousness (Pre- ?Awake and Alert ?procedure): ?Pre-procedure Verification/Time Out ?Yes - 10:31 ?Taken: ?Start Time: 10:31 ?Total Area Debrided (L x W): 1 (cm) x 1 (cm) = 1 (cm?) ?Tissue and other material ?Callus, Subcutaneous, Biofilm ?debrided: ?Level: Skin/Subcutaneous Tissue ?Debridement Description: Excisional ?Instrument: Curette ?Bleeding: Minimum ?Hemostasis Achieved: Pressure ?End Time: 10:36 ?Procedural Pain: 0 ?Post Procedural Pain: 0 ?Response to Treatment: Procedure was tolerated well ?Level of Consciousness (Post- ?Awake and Alert ?procedure): ?Post Debridement Measurements of Total  Wound ?Length: (cm) 1 ?Stage: Category/Stage III ?Width: (cm) 1 ?Depth: (cm) 0.2 ?Volume: (cm?) 0.157 ?Character of Wound/Ulcer Post Debridement: Improved ?Post Procedure Diagnosis ?Same as Pre-procedure ?Electronic Signature(s) ?Signed: 02/23/2022 4:22:30 PM By: Carlene Coria RN ?Signed: 02/24/2022 5:58:01 PM By: Worthy Keeler PA-C ?Entered By: Carlene Coria on 02/23/2022 10:33:53 ?[name], [name] ([id number]) ?-------------------------------------------------------------------------------- ?HPI Details ?Patient Name: [name], [name] ?Date of Service: 02/23/2022 10:15 AM ?Medical Record Number: [id number] ?Patient Account Number: [id number] ?Date of Birth/Sex: 04/05/1923 (88 y.o. 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 Dr. Amalia Hailey the podiatrist saw her on 07/20/2017 and sent her to the ?wound center for possible hyperbaric oxygen therapy. ?past medical history of peripheral vascular disease, varicose veins, status post appendectomy, basal cell carcinoma excision from the left leg, ?cholecystectomy, pacemaker placement, right lower extremity angiography done by Dr. dew in March 2017 with placement of a stent. there is ?also note of a successful ablation of the right small saphenous vein done which was reviewed by ultrasound on 10/24/2016. the patient had a ?right small saphenous vein ablation done on 10/20/2016. The patient has never been a smoker. ?She has been seen by Dr. Corene Cornea dew the vascular surgeon who most recently saw her on 06/15/2017 for evaluation of ongoing problems with ?right leg swelling. She had a lower  extremity arterial duplex examination done(02/13/17) which showed patent distal right superficial femoral ?artery stent and above-the-knee popliteal stent without evidence of restenosis. The ABI was more than 1.3 on the right and more than 1.3 on the ?left. This was consistent with noncompressible arteries due to medial calcification. The right great toe pressure and PPG waveforms are within ?normal limits and the left great toe pressure and PPG waveforms are decreased. ?he recommended she continue to  wear her compression stockings and continue with elevation. She is scheduled to have a noninvasive arterial ?study in the near future ?08/16/2017 -- had a lower extremity arterial duplex examination done which showed patent distal right superficial femoral artery stent and ?above-the-knee popliteal stent without evidence of restenosis. The ABI was more than 1.3 on the right and more than 1.3 on the left. This was ?consistent with noncompressible arteries due to medial calcification. The right great toe pressure and PPG waveforms are within normal limits and ?the left great toe pressure and PPG waveforms are decreased. ?the x-ray of the right ankle has not yet been done ?08/24/2017 -- had a right ankle x-ray -- IMPRESSION:1. No fracture, bone lesion or evidence of osteomyelitis. 2. Lateral soft tissue swelling with ?a soft tissue ulcer. ?she has not yet seen the vascular surgeon for review ?08/31/17 on evaluation today patient's wound appears to be showing signs of improvement. She still with her appointment with vascular in order ?to review her results of her vascular study and then determine if any intervention would be recommended at that time. No fevers, chills, nausea, ?or vomiting noted at this time. She has been tolerating the dressing changes without complication. ?09/28/17 on evaluation today patient's wound appears to show signs of good improvement in regard to the granulation tissue which is surfacing. ?There  is still a layer of slough covering the wound and the posterior portion is still significantly deeper than the anterior nonetheless there has ?been some good sign of things moving towards the better. She is going to go back to Dr. dew for reevaluation to ensure her blood flow is still ?appropriate. That will be before her next evaluation with [place] next week. No fevers, chills, nausea, or vomiting noted at this time. Patient does have ?some discomfort rated to be a 3-4/10 depending on activity specifically cleansing the wound makes it worse. ?10/05/2017 -- the patient was seen by Dr. Lucky Cowboy last week and noninvasive studies showed a normal right ABI with brisk triphasic waveforms ?consistent with no arterial insufficiency including normal digital pressures. The duplex showed a patent distal right SFA stent and the proximal SFA ?was also normal. He was pleased with her test and thought she should have enough of perfusion for normal wound healing. He would see her back ?in 6 months time. ?12/21/17 on evaluation today patient appears to be doing fairly well in regard to her right lateral ankle wound. Unfortunately the main issue that ?she is expansion at this point is that she is having some issues with what appears to be some cellulitis in the right anterior shin. She has also been ?noting a little bit of uncomfortable feeling especially last night and her ankle area. I'm afraid that she made the developing a little bit of an ?infection. With that being said I think it is in the early stages. ?12/28/17 on evaluation today patient's ankle appears to be doing excellent. She's making good progress at this point the cellulitis seems to have ?improved after last week's evaluation. Overall she is having no significant discomfort which is excellent news. She does have an appointment with ?Dr. dew on March 29, 2018 for reevaluation in regard to the stent he placed. She seems to have excellent blood flow in the right lower  extremity. ?01/19/12 on evaluation today patient's wound appears to be doing very well. In fact she does not appear to require debridement at this point, ?there's no evidence of infection, and overall from the standpoin

## 2022-03-02 ENCOUNTER — Encounter: Payer: Self-pay | Admitting: Podiatry

## 2022-03-02 ENCOUNTER — Ambulatory Visit: Payer: Medicare Other | Admitting: Podiatry

## 2022-03-02 ENCOUNTER — Encounter: Payer: Medicare Other | Admitting: Physician Assistant

## 2022-03-02 ENCOUNTER — Ambulatory Visit (INDEPENDENT_AMBULATORY_CARE_PROVIDER_SITE_OTHER): Payer: Medicare Other | Admitting: Podiatry

## 2022-03-02 DIAGNOSIS — I739 Peripheral vascular disease, unspecified: Secondary | ICD-10-CM | POA: Diagnosis not present

## 2022-03-02 DIAGNOSIS — I890 Lymphedema, not elsewhere classified: Secondary | ICD-10-CM | POA: Diagnosis not present

## 2022-03-02 DIAGNOSIS — B351 Tinea unguium: Secondary | ICD-10-CM | POA: Diagnosis not present

## 2022-03-02 DIAGNOSIS — M79676 Pain in unspecified toe(s): Secondary | ICD-10-CM

## 2022-03-02 DIAGNOSIS — L89513 Pressure ulcer of right ankle, stage 3: Secondary | ICD-10-CM | POA: Diagnosis not present

## 2022-03-02 NOTE — Progress Notes (Signed)
This patient returns to my office for at risk foot care.  This patient requires this care by a professional since this patient will be at risk due to having PVD and coagulation defect.  Patient is taking eliquis..  This patient is unable to cut nails herself since the patient cannot reach her nails.These nails are painful walking and wearing shoes. Patient presents to the office with her daughter. This patient presents for at risk foot care today.  General Appearance  Alert, conversant and in no acute stress.  Vascular  Dorsalis pedis and posterior tibial  pulses are weakly  palpable  bilaterally.  Capillary return is within normal limits  bilaterally. Cold feet.  Bilaterally. Absent hair noted.  Neurologic  Senn-Weinstein monofilament wire test within normal limits  bilaterally. Muscle power within normal limits bilaterally.  Nails Thick disfigured discolored nails with subungual debris  from hallux to fifth toes bilaterally. No evidence of bacterial infection or drainage bilaterally.  Orthopedic  No limitations of motion  feet .  No crepitus or effusions noted.  No bony pathology.  Medial deviation of 3,4 and 5 right foot.  No pain or swelling noted.  Skin  normotropic skin with no porokeratosis noted bilaterally.  No signs of infections or ulcers noted.     Onychomycosis  Pain in right toes  Pain in left toes  Consent was obtained for treatment procedures.   Mechanical debridement of nails 1-5  bilaterally performed with a nail nipper.  Filed with dremel without incident.    Return office visit    3 months                Told patient to return for periodic foot care and evaluation due to potential at risk complications.   Aasir Daigler DPM  

## 2022-03-03 NOTE — Progress Notes (Signed)
[name], [name] ([id number]) ?Visit Report for 03/02/2022 ?Chief Complaint Document Details ?Patient Name: [name], [name] ?Date of Service: 03/02/2022 11:00 AM ?Medical Record Number: [id number] ?Patient Account Number: [id number] ?Date of Birth/Sex: Jun 14, 1923 (88 y.o. F) ?Treating RN: Carlene Coria ?Primary Care Provider: Adrian Prows Other Clinician: ?Referring Provider: Adrian Prows ?Treating Provider/Extender: Jeri Cos ?Weeks in Treatment: 41 ?Information Obtained from: Patient ?Chief Complaint ?Right foot ulcer ?Electronic Signature(s) ?Signed: 03/02/2022 1:32:44 PM By: Worthy Keeler PA-C ?Entered By: Worthy Keeler on 03/02/2022 13:32:44 ?DAELYNN, BLOWER ([id number]) ?-------------------------------------------------------------------------------- ?Debridement Details ?Patient Name: [name], [name] ?Date of Service: 03/02/2022 11:00 AM ?Medical Record Number: [id number] ?Patient Account Number: [id number] ?Date of Birth/Sex: 1923-01-16 (88 y.o. F) ?Treating RN: Carlene Coria ?Primary Care Provider: Adrian Prows Other Clinician: ?Referring Provider: Adrian Prows ?Treating Provider/Extender: Jeri Cos ?Weeks in Treatment: 41 ?Debridement Performed for ?Wound #7 Right,Lateral Malleolus ?Assessment: ?Performed By: Physician Tommie Sams., PA-C ?Debridement Type: Debridement ?Level of Consciousness (Pre- ?Awake and Alert ?procedure): ?Pre-procedure Verification/Time Out ?Yes - 11:45 ?Taken: ?Start Time: 11:45 ?Pain Control: Lidocaine 4% Topical Solution ?Total Area Debrided (L x W): 1 (cm) x 1 (cm) = 1 (cm?) ?Tissue and other material ?Viable, Non-Viable, Slough, Subcutaneous, Ballou ?debrided: ?Level: Skin/Subcutaneous Tissue ?Debridement Description: Excisional ?Instrument: Curette ?Bleeding: Minimum ?Hemostasis Achieved: Pressure ?End Time: 11:49 ?Procedural Pain: 0 ?Post Procedural Pain: 0 ?Response to Treatment: Procedure was tolerated well ?Level of Consciousness (Post- ?Awake  and Alert ?procedure): ?Post Debridement Measurements of Total Wound ?Length: (cm) 1 ?Stage: Category/Stage III ?Width: (cm) 1 ?Depth: (cm) 0.2 ?Volume: (cm?) 0.157 ?Character of Wound/Ulcer Post Debridement: Improved ?Post Procedure Diagnosis ?Same as Pre-procedure ?Electronic Signature(s) ?Signed: 03/02/2022 4:23:05 PM By: Worthy Keeler PA-C ?Signed: 03/03/2022 4:01:15 PM By: Carlene Coria RN ?Entered ByCarlene Coria on 03/02/2022 12:32:39 ?JADAYA, SOMMERFIELD ([id number]) ?-------------------------------------------------------------------------------- ?HPI Details ?Patient Name: [name], [name] ?Date of Service: 03/02/2022 11:00 AM ?Medical Record Number: [id number] ?Patient Account Number: [id number] ?Date of Birth/Sex: Apr 17, 1923 (88 y.o. 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 Dr. Amalia Hailey the podiatrist saw her on 07/20/2017 and sent her to the ?wound center for possible hyperbaric oxygen therapy. ?past medical history of peripheral vascular disease, varicose veins, status post appendectomy, basal cell carcinoma excision from the left leg, ?cholecystectomy, pacemaker placement, right lower extremity angiography done by Dr. dew in March 2017 with placement of a stent. there is ?also note of a successful ablation of the right small saphenous vein done which was reviewed by ultrasound on 10/24/2016. the patient had a ?right small saphenous vein ablation done on 10/20/2016. The patient has never been a smoker. ?She has been seen by Dr. Corene Cornea dew the vascular surgeon who most recently saw her on 06/15/2017 for evaluation of  ongoing problems with ?right leg swelling. She had a lower extremity arterial duplex examination done(02/13/17) which showed patent distal right superficial femoral ?artery stent and above-the-knee popliteal stent without evidence of restenosis. The ABI was more than 1.3 on the right and more than 1.3 on the ?left. This was consistent with noncompressible arteries due to medial calcification. The right great toe pressure and PPG waveforms are within ?normal limits and the left great toe pressure and PPG  waveforms are decreased. ?he recommended she continue to wear her compression stockings and continue with elevation. She is scheduled to have a noninvasive arterial ?study in the near future ?08/16/2017 -- had a lower extremity arterial duplex examination done which showed patent distal right superficial femoral artery stent and ?above-the-knee popliteal stent without evidence of restenosis. The ABI was more than 1.3 on the right and more than 1.3 on the left. This was ?consistent with noncompressible arteries due to medial calcification. The right great toe pressure and PPG waveforms are within normal limits and ?the left great toe pressure and PPG waveforms are decreased. ?the x-ray of the right ankle has not yet been done ?08/24/2017 -- had a right ankle x-ray -- IMPRESSION:1. No fracture, bone lesion or evidence of osteomyelitis. 2. Lateral soft tissue swelling with ?a soft tissue ulcer. ?she has not yet seen the vascular surgeon for review ?08/31/17 on evaluation today patient's wound appears to be showing signs of improvement. She still with her appointment with vascular in order ?to review her results of her vascular study and then determine if any intervention would be recommended at that time. No fevers, chills, nausea, ?or vomiting noted at this time. She has been tolerating the dressing changes without complication. ?09/28/17 on evaluation today patient's wound appears to show signs of good improvement in  regard to the granulation tissue which is surfacing. ?There is still a layer of slough covering the wound and the posterior portion is still significantly deeper than the anterior nonetheless there has ?been some good sign of things moving towards the better. She is going to go back to Dr. dew for reevaluation to ensure her blood flow is still ?appropriate. That will be before her next evaluation with [place] next week. No fevers, chills, nausea, or vomiting noted at this time. Patient does have ?some discomfort rated to be a 3-4/10 depending on activity specifically cleansing the wound makes it worse. ?10/05/2017 -- the patient was seen by Dr. Lucky Cowboy last week and noninvasive studies showed a normal right ABI with brisk triphasic waveforms ?consistent with no arterial insufficiency including normal digital pressures. The duplex showed a patent distal right SFA stent and the proximal SFA ?was also normal. He was pleased with her test and thought she should have enough of perfusion for normal wound healing. He would see her back ?in 6 months time. ?12/21/17 on evaluation today patient appears to be doing fairly well in regard to her right lateral ankle wound. Unfortunately the main issue that ?she is expansion at this point is that she is having some issues with what appears to be some cellulitis in the right anterior shin. She has also been ?noting a little bit of uncomfortable feeling especially last night and her ankle area. I'm afraid that she made the developing a little bit of an ?infection. With that being said I think it is in the early stages. ?12/28/17 on evaluation today patient's ankle appears to be doing excellent. She's making good progress at this point the cellulitis seems to have ?improved after last week's evaluation. Overall she is having no significant discomfort which is excellent news. She does have an appointment with ?Dr. dew on March 29, 2018 for reevaluation in regard to the stent he placed. She seems  to have excellent blood flow in the right lower extremity. ?01/19/12 on evaluation today patient's wound appears to be doing very well. In fact she does not appear to require debridement at this point, ?t

## 2022-03-03 NOTE — Progress Notes (Signed)
REVECA, DESMARAIS ([id number]) ?Visit Report for 03/02/2022 ?Arrival Information Details ?Patient Name: [name], [name] ?Date of Service: 03/02/2022 11:00 AM ?Medical Record Number: [id number] ?Patient Account Number: [id number] ?Date of Birth/Sex: February 19, 1923 (88 y.o. F) ?Treating RN: Carlene Coria ?Primary Care Jessy Calixte: Adrian Prows Other Clinician: ?Referring Ariele Vidrio: Adrian Prows ?Treating Josy Peaden/Extender: Jeri Cos ?Weeks in Treatment: 41 ?Visit Information History Since Last Visit ?All ordered tests and consults were completed: No ?Patient Arrived: [name] ?Added or deleted any medications: No ?Arrival Time: 11:04 ?Any new allergies or adverse reactions: No ?Accompanied By: daughter ?Had a fall or experienced change in No ?Transfer Assistance: None ?activities of daily living that may affect ?Patient Identification Verified: Yes ?risk of falls: ?Secondary Verification Process Completed: Yes ?Signs or symptoms of abuse/neglect since last visito No ?Patient Requires Transmission-Based No ?Hospitalized since last visit: No ?Precautions: ?Implantable device outside of the clinic excluding No ?Patient Has Alerts: Yes ?cellular tissue based products placed in the center ?Patient Alerts: Patient on Blood ?since last visit: ?Thinner ?Has Dressing in Place as Prescribed: Yes ?NOT DIABETIC ?Has Compression in Place as Prescribed: Yes ?***ELIQUIS*** ?Pain Present Now: No ?Electronic Signature(s) ?Signed: 03/03/2022 4:01:15 PM By: Carlene Coria RN ?Entered By: Carlene Coria on 03/02/2022 11:09:36 ?DALYCE, RENNE ([id number]) ?-------------------------------------------------------------------------------- ?Clinic Level of Care Assessment Details ?Patient Name: VALLERI, HENDRICKSEN ?Date of Service: 03/02/2022 11:00 AM ?Medical Record Number: [id number] ?Patient Account Number: [id number] ?Date of Birth/Sex: 1923/02/10 (88 y.o. F) ?Treating RN: Carlene Coria ?Primary Care Javonni Macke: Adrian Prows Other  Clinician: ?Referring Thomasenia Dowse: Adrian Prows ?Treating Laria Grimmett/Extender: Jeri Cos ?Weeks in Treatment: 41 ?Clinic Level of Care Assessment Items ?TOOL 1 Quantity Score ?'[]'$  - Use when EandM and Procedure is performed on INITIAL visit 0 ?ASSESSMENTS - Nursing Assessment / Reassessment ?'[]'$  - General Physical Exam (combine w/ comprehensive assessment (listed just below) when performed on new ?0 ?pt. evals) ?'[]'$  - 0 ?Comprehensive Assessment (HX, ROS, Risk Assessments, Wounds Hx, etc.) ?ASSESSMENTS - Wound and Skin Assessment / Reassessment ?'[]'$  - Dermatologic / Skin Assessment (not related to wound area) 0 ?ASSESSMENTS - Ostomy and/or Continence Assessment and Care ?'[]'$  - Incontinence Assessment and Management 0 ?'[]'$  - 0 ?Ostomy Care Assessment and Management (repouching, etc.) ?PROCESS - Coordination of Care ?'[]'$  - Simple Patient / Family Education for ongoing care 0 ?'[]'$  - 0 ?Complex (extensive) Patient / Family Education for ongoing care ?'[]'$  - 0 ?Staff obtains Consents, Records, Test Results / Process Orders ?'[]'$  - 0 ?Staff telephones HHA, Nursing Homes / Clarify orders / etc ?'[]'$  - 0 ?Routine Transfer to another Facility (non-emergent condition) ?'[]'$  - 0 ?Routine Hospital Admission (non-emergent condition) ?'[]'$  - 0 ?New Admissions / Biomedical engineer / Ordering NPWT, Apligraf, etc. ?'[]'$  - 0 ?Emergency Hospital Admission (emergent condition) ?PROCESS - Special Needs ?'[]'$  - Pediatric / Minor Patient Management 0 ?'[]'$  - 0 ?Isolation Patient Management ?'[]'$  - 0 ?Hearing / Language / Visual special needs ?'[]'$  - 0 ?Assessment of Community assistance (transportation, D/C planning, etc.) ?'[]'$  - 0 ?Additional assistance / Altered mentation ?'[]'$  - 0 ?Support Surface(s) Assessment (bed, cushion, seat, etc.) ?INTERVENTIONS - Miscellaneous ?'[]'$  - External ear exam 0 ?'[]'$  - 0 ?Patient Transfer (multiple staff / Civil Service fast streamer / Similar devices) ?'[]'$  - 0 ?Simple Staple / Suture removal (25 or less) ?'[]'$  - 0 ?Complex Staple / Suture removal  (26 or more) ?'[]'$  - 0 ?Hypo/Hyperglycemic Management (do not check if billed separately) ?'[]'$  - 0 ?Ankle / Brachial Index (ABI) - do not check if billed separately ?Has the  patient been seen at the hospital within the last three years: Yes ?Total Score: 0 ?Level Of Care: ____ ?MARYETTA, SHAFER ([id number]) ?Electronic Signature(s) ?Signed: 03/03/2022 4:01:15 PM By: Carlene Coria RN ?Entered By: Carlene Coria on 03/02/2022 12:45:38 ?SINDHU, NGUYEN ([id number]) ?-------------------------------------------------------------------------------- ?Encounter Discharge Information Details ?Patient Name: HARU, ANSPAUGH ?Date of Service: 03/02/2022 11:00 AM ?Medical Record Number: [id number] ?Patient Account Number: [id number] ?Date of Birth/Sex: [birth year] (88 y.o. F) ?Treating RN: Carlene Coria ?Primary Care Sabreen Kitchen: Adrian Prows Other Clinician: ?Referring Jalaina Salyers: Adrian Prows ?Treating Orvis Stann/Extender: Jeri Cos ?Weeks in Treatment: 41 ?Encounter Discharge Information Items Post Procedure Vitals ?Discharge Condition: Stable ?Temperature (?F): 97.8 ?Ambulatory Status: Ambulatory ?Pulse (bpm): 76 ?Discharge Destination: Home ?Respiratory Rate (breaths/min): 18 ?Transportation: Private Auto ?Blood Pressure (mmHg): 149/80 ?Accompanied By: self ?Schedule Follow-up Appointment: Yes ?Clinical Summary of Care: Patient Declined ?Electronic Signature(s) ?Signed: 03/02/2022 12:47:09 PM By: Carlene Coria RN ?Entered By: Carlene Coria on 03/02/2022 12:47:08 ?MAKIA, BOSSI ([id number]) ?-------------------------------------------------------------------------------- ?Lower Extremity Assessment Details ?Patient Name: CHEKESHA, BEHLKE ?Date of Service: 03/02/2022 11:00 AM ?Medical Record Number: [id number] ?Patient Account Number: [id number] ?Date of Birth/Sex: Jul 02, 1923 (88 y.o. F) ?Treating RN: Carlene Coria ?Primary Care Lailani Tool: Adrian Prows Other Clinician: ?Referring Amariana Mirando: Adrian Prows ?Treating  Coren Crownover/Extender: Jeri Cos ?Weeks in Treatment: 41 ?Edema Assessment ?Assessed: [Left: No] [Right: No] ?Edema: [Left: Ye] [Right: s] ?Calf ?Left: Right: ?Point of Measurement: 33 cm From Medial Instep 27 cm ?Ankle ?Left: Right: ?Point of Measurement: 10 cm From Medial Instep 17 cm ?Vascular Assessment ?Pulses: ?Dorsalis Pedis ?Palpable: [Right:Yes] ?Electronic Signature(s) ?Signed: 03/03/2022 4:01:15 PM By: Carlene Coria RN ?Entered By: Carlene Coria on 03/02/2022 11:22:05 ?KRITIKA, STUKES ([id number]) ?-------------------------------------------------------------------------------- ?Multi Wound Chart Details ?Patient Name: YOLANDO, GILLUM ?Date of Service: 03/02/2022 11:00 AM ?Medical Record Number: [id number] ?Patient Account Number: [id number] ?Date of Birth/Sex: 14-Dec-1922 (88 y.o. F) ?Treating RN: Carlene Coria ?Primary Care Seri Kimmer: Adrian Prows Other Clinician: ?Referring Roxann Vierra: Adrian Prows ?Treating Majesti Gambrell/Extender: Jeri Cos ?Weeks in Treatment: 41 ?Vital Signs ?Height(in): 62 ?Pulse(bpm): 76 ?Weight(lbs): 150 ?Blood Pressure(mmHg): 149/80 ?Body Mass Index(BMI): 27.4 ?Temperature(??F): 97.8 ?Respiratory Rate(breaths/min): 18 ?Photos: [N/A:N/A] ?Wound Location: Right, Lateral Malleolus N/A N/A ?Wounding Event: Gradually Appeared N/A N/A ?Primary Etiology: Pressure Ulcer N/A N/A ?Comorbid History: Cataracts, Congestive Heart Failure, N/A N/A ?Hypertension, Peripheral Arterial ?Disease, Osteoarthritis, Neuropathy ?Date Acquired: 05/12/2021 N/A N/A ?Weeks of Treatment: 3 N/A N/A ?Wound Status: Open N/A N/A ?Wound Recurrence: No N/A N/A ?Measurements L x W x D (cm) 1x1x0.2 N/A N/A ?Area (cm?) : 0.785 N/A N/A ?Volume (cm?) : 0.157 N/A N/A ?% Reduction in Area: 30.10% N/A N/A ?% Reduction in Volume: 30.20% N/A N/A ?Classification: Category/Stage III N/A N/A ?Exudate Amount: Medium N/A N/A ?Exudate Type: Serosanguineous N/A N/A ?Exudate Color: red, brown N/A N/A ?Granulation Amount: Medium  (34-66%) N/A N/A ?Granulation Quality: Red N/A N/A ?Necrotic Amount: Medium (34-66%) N/A N/A ?Exposed Structures: ?Fat Layer (Subcutaneous Tissue): N/A N/A ?Yes ?Fascia: No ?Tendon: No ?Muscle: No ?Joint: No ?Bone:

## 2022-03-09 ENCOUNTER — Encounter: Payer: Medicare Other | Attending: Physician Assistant | Admitting: Physician Assistant

## 2022-03-09 DIAGNOSIS — Z85828 Personal history of other malignant neoplasm of skin: Secondary | ICD-10-CM | POA: Insufficient documentation

## 2022-03-09 DIAGNOSIS — Z7901 Long term (current) use of anticoagulants: Secondary | ICD-10-CM | POA: Insufficient documentation

## 2022-03-09 DIAGNOSIS — L89513 Pressure ulcer of right ankle, stage 3: Secondary | ICD-10-CM | POA: Diagnosis present

## 2022-03-09 DIAGNOSIS — I890 Lymphedema, not elsewhere classified: Secondary | ICD-10-CM | POA: Insufficient documentation

## 2022-03-09 DIAGNOSIS — M1990 Unspecified osteoarthritis, unspecified site: Secondary | ICD-10-CM | POA: Insufficient documentation

## 2022-03-09 DIAGNOSIS — I872 Venous insufficiency (chronic) (peripheral): Secondary | ICD-10-CM | POA: Insufficient documentation

## 2022-03-09 DIAGNOSIS — I739 Peripheral vascular disease, unspecified: Secondary | ICD-10-CM | POA: Diagnosis not present

## 2022-03-09 DIAGNOSIS — Z95820 Peripheral vascular angioplasty status with implants and grafts: Secondary | ICD-10-CM | POA: Diagnosis not present

## 2022-03-09 DIAGNOSIS — G629 Polyneuropathy, unspecified: Secondary | ICD-10-CM | POA: Insufficient documentation

## 2022-03-09 DIAGNOSIS — I10 Essential (primary) hypertension: Secondary | ICD-10-CM | POA: Diagnosis not present

## 2022-03-09 DIAGNOSIS — Z950 Presence of cardiac pacemaker: Secondary | ICD-10-CM | POA: Insufficient documentation

## 2022-03-09 NOTE — Progress Notes (Addendum)
[name], [name] ([id number]) ?Visit Report for 03/09/2022 ?Arrival Information Details ?Patient Name: [name], [name] ?Date of Service: 03/09/2022 10:15 AM ?Medical Record Number: [id number] ?Patient Account Number: [id number] ?Date of Birth/Sex: January 09, 1923 (88 y.o. F) ?Treating RN: [name] ?Primary Care [name]: [name] Other Clinician: ?Referring [name]: [name] ?Treating [name]/Extender: [name] ?Weeks in Treatment: 42 ?Visit Information History Since Last Visit ?All ordered tests and consults were completed: No ?Patient Arrived: [name] ?Added or deleted any medications: No ?Arrival Time: 10:21 ?Any new allergies or adverse reactions: No ?Accompanied By: daughter ?Had a fall or experienced change in No ?Transfer Assistance: None ?activities of daily living that may affect ?Patient Identification Verified: Yes ?risk of falls: ?Secondary Verification Process Completed: Yes ?Signs or symptoms of abuse/neglect since last visito No ?Patient Requires Transmission-Based No ?Hospitalized since last visit: No ?Precautions: ?Implantable device outside of the clinic excluding No ?Patient Has Alerts: Yes ?cellular tissue based products placed in the center ?Patient Alerts: Patient on Blood ?since last visit: ?Thinner ?Has Dressing in Place as Prescribed: Yes ?NOT DIABETIC ?Pain Present Now: No ?***ELIQUIS*** ?Electronic Signature(s) ?Signed: 03/09/2022 1:18:59 PM By: [name] Coria RN ?Entered By: [name] on 03/09/2022 10:25:08 ?[name], [name] ([id number]) ?-------------------------------------------------------------------------------- ?Clinic Level of Care Assessment Details ?Patient Name: [name], [name] ?Date of Service: 03/09/2022 10:15 AM ?Medical Record Number: [id number] ?Patient Account Number: [id number] ?Date of Birth/Sex: 1923-09-29 (88 y.o. F) ?Treating RN: [name] ?Primary Care [name]: [name] Other Clinician: ?Referring [name]: [name] ?Treating [name]/Extender: [name] ?Weeks in Treatment: 42 ?Clinic Level of Care Assessment Items ?TOOL 4 Quantity Score ?'[]'$  - Use when only an EandM is performed on FOLLOW-UP visit 0 ?ASSESSMENTS - Nursing Assessment / Reassessment ?'[]'$  - Reassessment of Co-morbidities (includes updates in patient status) 0 ?'[]'$  - 0 ?Reassessment of Adherence to Treatment Plan ?ASSESSMENTS - Wound and Skin Assessment / Reassessment ?'[]'$  - Simple Wound Assessment / Reassessment - one wound 0 ?'[]'$  - 0 ?Complex Wound Assessment / Reassessment - multiple wounds ?'[]'$  - 0 ?Dermatologic / Skin Assessment (not related to wound area) ?ASSESSMENTS - Focused Assessment ?'[]'$  - Circumferential Edema Measurements - multi extremities 0 ?'[]'$  - 0 ?Nutritional Assessment / Counseling / Intervention ?'[]'$  - 0 ?Lower Extremity Assessment (monofilament, tuning fork, pulses) ?'[]'$  - 0 ?Peripheral Arterial Disease Assessment (using hand held doppler) ?ASSESSMENTS - Ostomy and/or Continence Assessment and Care ?'[]'$  - Incontinence Assessment and Management 0 ?'[]'$  - 0 ?Ostomy Care Assessment and Management (repouching, etc.) ?PROCESS - Coordination of Care ?'[]'$  - Simple Patient / Family Education for ongoing care 0 ?'[]'$  - 0 ?Complex (extensive) Patient / Family Education for ongoing care ?'[]'$  - 0 ?Staff obtains Consents, Records, Test Results / Process Orders ?'[]'$  - 0 ?Staff telephones HHA, Nursing Homes / Clarify orders / etc ?'[]'$  - 0 ?Routine Transfer to another Facility (non-emergent condition) ?'[]'$  - 0 ?Routine Hospital Admission (non-emergent condition) ?'[]'$  - 0 ?New Admissions / Biomedical engineer / Ordering NPWT, Apligraf, etc. ?'[]'$  - 0 ?Emergency Hospital Admission (emergent condition) ?'[]'$  - 0 ?Simple Discharge Coordination ?'[]'$  - 0 ?Complex (extensive) Discharge Coordination ?PROCESS - Special Needs ?'[]'$  - Pediatric / Minor Patient Management 0 ?'[]'$  - 0 ?Isolation Patient Management ?'[]'$  - 0 ?Hearing / Language / Visual special needs ?'[]'$  - 0 ?Assessment  of Community assistance (transportation, D/C planning, etc.) ?'[]'$  - 0 ?Additional assistance / Altered mentation ?'[]'$  - 0 ?Support Surface(s) Assessment (bed, cushion, seat, etc.) ?INTERVENTIONS - Wound Cleansing / Measurement ?[name], [name] ([id number]) ?'[]'$  -  0 ?Simple Wound Cleansing - one wound ?'[]'$  - 0 ?Complex Wound Cleansing - multiple wounds ?'[]'$  - 0 ?Wound Imaging (photographs - any number of wounds) ?'[]'$  - 0 ?Wound Tracing (instead of photographs) ?'[]'$  - 0 ?Simple Wound Measurement - one wound ?'[]'$  - 0 ?Complex Wound Measurement - multiple wounds ?INTERVENTIONS - Wound Dressings ?'[]'$  - Small Wound Dressing one or multiple wounds 0 ?'[]'$  - 0 ?Medium Wound Dressing one or multiple wounds ?'[]'$  - 0 ?Large Wound Dressing one or multiple wounds ?'[]'$  - 0 ?Application of Medications - topical ?'[]'$  - 0 ?Application of Medications - injection ?INTERVENTIONS - Miscellaneous ?'[]'$  - External ear exam 0 ?'[]'$  - 0 ?Specimen Collection (cultures, biopsies, blood, body fluids, etc.) ?'[]'$  - 0 ?Specimen(s) / Culture(s) sent or taken to Lab for analysis ?'[]'$  - 0 ?Patient Transfer (multiple staff / Civil Service fast streamer / Similar devices) ?'[]'$  - 0 ?Simple Staple / Suture removal (25 or less) ?'[]'$  - 0 ?Complex Staple / Suture removal (26 or more) ?'[]'$  - 0 ?Hypo / Hyperglycemic Management (close monitor of Blood Glucose) ?'[]'$  - 0 ?Ankle / Brachial Index (ABI) - do not check if billed separately ?'[]'$  - 0 ?Vital Signs ?Has the patient been seen at the hospital within the last three years: Yes ?Total Score: 0 ?Level Of Care: ____ ?Electronic Signature(s) ?Signed: 03/09/2022 1:18:59 PM By: [name] Coria RN ?Entered By: [name] on 03/09/2022 10:47:44 ?[name], [name] ([id number]) ?-------------------------------------------------------------------------------- ?Encounter Discharge Information Details ?Patient Name: [name], [name] ?Date of Service: 03/09/2022 10:15 AM ?Medical Record Number: [id number] ?Patient Account Number: [id number] ?Date of Birth/Sex:  1923/12/02 (88 y.o. F) ?Treating RN: [name] ?Primary Care Seairra Otani: [name] Other Clinician: ?Referring Sahej Schrieber: [name] ?Treating Zeke Aker/Extender: [name] ?Weeks in Treatment: 42 ?Encounter Discharge Information Items Post Procedure Vitals ?Discharge Condition: Stable ?Temperature (?F): 98.1 ?Ambulatory Status: Ambulatory ?Pulse (bpm): 80 ?Discharge Destination: Home ?Respiratory Rate (breaths/min): 18 ?Transportation: Private Auto ?Blood Pressure (mmHg): 122/70 ?Accompanied By: self ?Schedule Follow-up Appointment: Yes ?Clinical Summary of Care: Patient Declined ?Electronic Signature(s) ?Signed: 03/09/2022 1:18:59 PM By: [name] Coria RN ?Entered By: [name] on 03/09/2022 11:01:35 ?[name], [name] ([id number]) ?-------------------------------------------------------------------------------- ?Lower Extremity Assessment Details ?Patient Name: [name], [name] ?Date of Service: 03/09/2022 10:15 AM ?Medical Record Number: [id number] ?Patient Account Number: [id number] ?Date of Birth/Sex: Sep 22, 1923 (88 y.o. F) ?Treating RN: [name] ?Primary Care Nattaly Yebra: [name] Other Clinician: ?Referring Hardin Hardenbrook: [name] ?Treating Shamecka Hocutt/Extender: [name] ?Weeks in Treatment: 42 ?Edema Assessment ?Assessed: [Left: No] [Right: No] ?[Left: Edema] [Right: :] ?Calf ?Left: Right: ?Point of Measurement: 33 cm From Medial Instep 27 cm ?Ankle ?Left: Right: ?Point of Measurement: 10 cm From Medial Instep 17 cm ?Vascular Assessment ?Pulses: ?Dorsalis Pedis ?Palpable: [Right:Yes] ?Electronic Signature(s) ?Signed: 03/09/2022 1:18:59 PM By: [name] Coria RN ?Entered ByCarlene [name] on 03/09/2022 10:36:24 ?[name], [name] ([id number]) ?-------------------------------------------------------------------------------- ?Multi Wound Chart Details ?Patient Name: [name], [name] ?Date of Service: 03/09/2022 10:15 AM ?Medical Record Number: [id number] ?Patient Account Number:  [id number] ?Date of Birth/Sex: Nov 28, 1923 (88 y.o. F) ?Treating RN: [name] ?Primary Care Abdurrahman Petersheim: [name] Other Clinician: ?Referring Tameshia Bonneville: [name] ?Treating Arelyn Gauer/Extender: [name] ?Weeks

## 2022-03-09 NOTE — Progress Notes (Addendum)
[name] ([id number]) ?Visit Report for 03/09/2022 ?Chief Complaint Document Details ?Patient Name: [name], [name] ?Date of Service: 03/09/2022 10:15 AM ?Medical Record Number: [id number] ?Patient Account Number: [id number] ?Date of Birth/Sex: 1922/12/06 (88 y.o. F) ?Treating RN: Carlene Coria ?Primary Care Provider: Adrian Prows Other Clinician: ?Referring Provider: Adrian Prows ?Treating Provider/Extender: Jeri Cos ?Weeks in Treatment: 42 ?Information Obtained from: Patient ?Chief Complaint ?Right foot ulcer ?Electronic Signature(s) ?Signed: 03/09/2022 10:23:22 AM By: Worthy Keeler PA-C ?Entered By: Worthy Keeler on 03/09/2022 10:23:21 ?[name], [name] ([id number]) ?-------------------------------------------------------------------------------- ?Debridement Details ?Patient Name: [name] ?Date of Service: 03/09/2022 10:15 AM ?Medical Record Number: [id number] ?Patient Account Number: [id number] ?Date of Birth/Sex: [birth year] (88 y.o. F) ?Treating RN: Carlene Coria ?Primary Care Provider: Adrian Prows Other Clinician: ?Referring Provider: Adrian Prows ?Treating Provider/Extender: Jeri Cos ?Weeks in Treatment: 42 ?Debridement Performed for ?Wound #7 Right,Lateral Malleolus ?Assessment: ?Performed By: Physician Tommie Sams., PA-C ?Debridement Type: Debridement ?Level of Consciousness (Pre- ?Awake and Alert ?procedure): ?Pre-procedure Verification/Time Out ?Yes - 10:44 ?Taken: ?Start Time: 10:44 ?Pain Control: Lidocaine 4% Topical Solution ?Total Area Debrided (L x W): 1 (cm) x 0.8 (cm) = 0.8 (cm?) ?Tissue and other material ?Viable, Non-Viable, Slough, Subcutaneous, [place] ?debrided: ?Level: Skin/Subcutaneous Tissue ?Debridement Description: Excisional ?Instrument: Curette ?Bleeding: Minimum ?Hemostasis Achieved: Pressure ?End Time: 10:47 ?Procedural Pain: 0 ?Post Procedural Pain: 0 ?Response to Treatment: Procedure was tolerated well ?Level of Consciousness  (Post- ?Awake and Alert ?procedure): ?Post Debridement Measurements of Total Wound ?Length: (cm) 1 ?Stage: Category/Stage III ?Width: (cm) 0.8 ?Depth: (cm) 0.2 ?Volume: (cm?) 0.126 ?Character of Wound/Ulcer Post Debridement: Improved ?Post Procedure Diagnosis ?Same as Pre-procedure ?Electronic Signature(s) ?Signed: 03/09/2022 1:18:59 PM By: Carlene Coria RN ?Signed: 03/10/2022 5:15:24 PM By: Worthy Keeler PA-C ?Entered By: Carlene Coria on 03/09/2022 10:47:06 ?[name] ([id number]) ?-------------------------------------------------------------------------------- ?HPI Details ?Patient Name: [name], [name] ?Date of Service: 03/09/2022 10:15 AM ?Medical Record Number: [id number] ?Patient Account Number: [id number] ?Date of Birth/Sex: Feb 21, 1923 (88 y.o. 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 Dr. Amalia Hailey the podiatrist saw her on 07/20/2017 and sent her to the ?wound center for possible hyperbaric oxygen therapy. ?past medical history of peripheral vascular disease, varicose veins, status post appendectomy, basal cell carcinoma excision from the left leg, ?cholecystectomy, pacemaker placement, right lower extremity angiography done by Dr. dew in March 2017 with placement of a stent. there is ?also note of a successful ablation of the right small saphenous vein done which was reviewed by ultrasound on 10/24/2016. the patient had a ?right small saphenous vein ablation done on 10/20/2016. The patient has never been a smoker. ?She has been seen by Dr. Corene Cornea dew the vascular surgeon who most recently saw her on 06/15/2017 for  evaluation of ongoing problems with ?right leg swelling. She had a lower extremity arterial duplex examination done(02/13/17) which showed patent distal right superficial femoral ?artery stent and above-the-knee popliteal stent without evidence of restenosis. The ABI was more than 1.3 on the right and more than 1.3 on the ?left. This was consistent with noncompressible arteries due to medial calcification. The right great toe pressure and PPG waveforms are within ?normal limits and the left great toe pressure and PPG  waveforms are decreased. ?he recommended she continue to wear her compression stockings and continue with elevation. She is scheduled to have a noninvasive arterial ?study in the near future ?08/16/2017 -- had a lower extremity arterial duplex examination done which showed patent distal right superficial femoral artery stent and ?above-the-knee popliteal stent without evidence of restenosis. The ABI was more than 1.3 on the right and more than 1.3 on the left. This was ?consistent with noncompressible arteries due to medial calcification. The right great toe pressure and PPG waveforms are within normal limits and ?the left great toe pressure and PPG waveforms are decreased. ?the x-ray of the right ankle has not yet been done ?08/24/2017 -- had a right ankle x-ray -- IMPRESSION:1. No fracture, bone lesion or evidence of osteomyelitis. 2. Lateral soft tissue swelling with ?a soft tissue ulcer. ?she has not yet seen the vascular surgeon for review ?08/31/17 on evaluation today patient's wound appears to be showing signs of improvement. She still with her appointment with vascular in order ?to review her results of her vascular study and then determine if any intervention would be recommended at that time. No fevers, chills, nausea, ?or vomiting noted at this time. She has been tolerating the dressing changes without complication. ?09/28/17 on evaluation today patient's wound appears to show signs of good  improvement in regard to the granulation tissue which is surfacing. ?There is still a layer of slough covering the wound and the posterior portion is still significantly deeper than the anterior nonetheless there has ?been some good sign of things moving towards the better. She is going to go back to Dr. dew for reevaluation to ensure her blood flow is still ?appropriate. That will be before her next evaluation with [place] next week. No fevers, chills, nausea, or vomiting noted at this time. Patient does have ?some discomfort rated to be a 3-4/10 depending on activity specifically cleansing the wound makes it worse. ?10/05/2017 -- the patient was seen by Dr. Lucky Cowboy last week and noninvasive studies showed a normal right ABI with brisk triphasic waveforms ?consistent with no arterial insufficiency including normal digital pressures. The duplex showed a patent distal right SFA stent and the proximal SFA ?was also normal. He was pleased with her test and thought she should have enough of perfusion for normal wound healing. He would see her back ?in 6 months time. ?12/21/17 on evaluation today patient appears to be doing fairly well in regard to her right lateral ankle wound. Unfortunately the main issue that ?she is expansion at this point is that she is having some issues with what appears to be some cellulitis in the right anterior shin. She has also been ?noting a little bit of uncomfortable feeling especially last night and her ankle area. I'm afraid that she made the developing a little bit of an ?infection. With that being said I think it is in the early stages. ?12/28/17 on evaluation today patient's ankle appears to be doing excellent. She's making good progress at this point the cellulitis seems to have ?improved after last week's evaluation. Overall she is having no significant discomfort which is excellent news. She does have an appointment with ?Dr. dew on March 29, 2018 for reevaluation in regard to the stent he  placed. She seems to have excellent blood flow in the right lower extremity. ?01/19/12 on evaluation today patient's wound appears to be doing very well. In fact she does not appear to require debridement at this point, ?t

## 2022-03-16 ENCOUNTER — Encounter: Payer: Medicare Other | Admitting: Physician Assistant

## 2022-03-16 DIAGNOSIS — L89513 Pressure ulcer of right ankle, stage 3: Secondary | ICD-10-CM | POA: Diagnosis not present

## 2022-03-16 NOTE — Progress Notes (Addendum)
ANAGABRIELA, JOKERST ([id number]) ?Visit Report for 03/16/2022 ?Chief Complaint Document Details ?Patient Name: [name], [name] ?Date of Service: 03/16/2022 10:15 AM ?Medical Record Number: [id number] ?Patient Account Number: [id number] ?Date of Birth/Sex: [birth year] (88 y.o. F) ?Treating RN: Carlene Coria ?Primary Care Provider: Adrian Prows Other Clinician: ?Referring Provider: Adrian Prows ?Treating Provider/Extender: Jeri Cos ?Weeks in Treatment: 75 ?Information Obtained from: Patient ?Chief Complaint ?Right foot ulcer ?Electronic Signature(s) ?Signed: 03/16/2022 10:29:44 AM By: Worthy Keeler PA-C ?Entered By: Worthy Keeler on 03/16/2022 10:29:44 ?IVANNA, KOCAK ([id number]) ?-------------------------------------------------------------------------------- ?Debridement Details ?Patient Name: [name], [name] ?Date of Service: 03/16/2022 10:15 AM ?Medical Record Number: [id number] ?Patient Account Number: [id number] ?Date of Birth/Sex: 05/01/1923 (88 y.o. F) ?Treating RN: Carlene Coria ?Primary Care Provider: Adrian Prows Other Clinician: ?Referring Provider: Adrian Prows ?Treating Provider/Extender: Jeri Cos ?Weeks in Treatment: 63 ?Debridement Performed for ?Wound #7 Right,Lateral Malleolus ?Assessment: ?Performed By: Physician Tommie Sams., PA-C ?Debridement Type: Debridement ?Level of Consciousness (Pre- ?Awake and Alert ?procedure): ?Pre-procedure Verification/Time Out ?Yes - 10:30 ?Taken: ?Start Time: 10:30 ?Total Area Debrided (L x W): 0.9 (cm) x 0.8 (cm) = 0.72 (cm?) ?Tissue and other material ?Viable, Non-Viable, Slough, Subcutaneous, Skin: Dermis , Skin: Epidermis, Slough ?debrided: ?Level: Skin/Subcutaneous Tissue ?Debridement Description: Excisional ?Instrument: Curette ?Bleeding: Minimum ?Hemostasis Achieved: Pressure ?End Time: 10:33 ?Procedural Pain: 0 ?Post Procedural Pain: 0 ?Response to Treatment: Procedure was tolerated well ?Level of Consciousness (Post- ?Awake and  Alert ?procedure): ?Post Debridement Measurements of Total Wound ?Length: (cm) 0.9 ?Stage: Category/Stage III ?Width: (cm) 0.8 ?Depth: (cm) 0.2 ?Volume: (cm?) 0.113 ?Character of Wound/Ulcer Post Debridement: Improved ?Post Procedure Diagnosis ?Same as Pre-procedure ?Electronic Signature(s) ?Signed: 03/16/2022 3:59:26 PM By: Worthy Keeler PA-C ?Signed: 03/21/2022 9:16:22 AM By: Carlene Coria RN ?Entered By: Carlene Coria on 03/16/2022 10:34:23 ?[name], [name] ([id number]) ?-------------------------------------------------------------------------------- ?HPI Details ?Patient Name: [name], [name] ?Date of Service: 03/16/2022 10:15 AM ?Medical Record Number: [id number] ?Patient Account Number: [id number] ?Date of Birth/Sex: 1923/10/17 (88 y.o. 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 Dr. Amalia Hailey the podiatrist saw her on 07/20/2017 and sent her to the ?wound center for possible hyperbaric oxygen therapy. ?past medical history of peripheral vascular disease, varicose veins, status post appendectomy, basal cell carcinoma excision from the left leg, ?cholecystectomy, pacemaker placement, right lower extremity angiography done by Dr. dew in March 2017 with placement of a stent. there is ?also note of a successful ablation of the right small saphenous vein done which was reviewed by ultrasound on 10/24/2016. the patient had a ?right small saphenous vein ablation done on 10/20/2016. The patient has never been a smoker. ?She has been seen by Dr. Corene Cornea dew the vascular surgeon who most recently saw her on 06/15/2017 for evaluation of  ongoing problems with ?right leg swelling. She had a lower extremity arterial duplex examination done(02/13/17) which showed patent distal right superficial femoral ?artery stent and above-the-knee popliteal stent without evidence of restenosis. The ABI was more than 1.3 on the right and more than 1.3 on the ?left. This was consistent with noncompressible arteries due to medial calcification. The right great toe pressure and PPG waveforms are within ?normal limits and the left great toe pressure and PPG waveforms  are decreased. ?he recommended she continue to wear her compression stockings and continue with elevation. She is scheduled to have a noninvasive arterial ?study in the near future ?08/16/2017 -- had a lower extremity arterial duplex examination done which showed patent distal right superficial femoral artery stent and ?above-the-knee popliteal stent without evidence of restenosis. The ABI was more than 1.3 on the right and more than 1.3 on the left. This was ?consistent with noncompressible arteries due to medial calcification. The right great toe pressure and PPG waveforms are within normal limits and ?the left great toe pressure and PPG waveforms are decreased. ?the x-ray of the right ankle has not yet been done ?08/24/2017 -- had a right ankle x-ray -- IMPRESSION:1. No fracture, bone lesion or evidence of osteomyelitis. 2. Lateral soft tissue swelling with ?a soft tissue ulcer. ?she has not yet seen the vascular surgeon for review ?08/31/17 on evaluation today patient's wound appears to be showing signs of improvement. She still with her appointment with vascular in order ?to review her results of her vascular study and then determine if any intervention would be recommended at that time. No fevers, chills, nausea, ?or vomiting noted at this time. She has been tolerating the dressing changes without complication. ?09/28/17 on evaluation today patient's wound appears to show signs of good improvement in  regard to the granulation tissue which is surfacing. ?There is still a layer of slough covering the wound and the posterior portion is still significantly deeper than the anterior nonetheless there has ?been some good sign of things moving towards the better. She is going to go back to Dr. dew for reevaluation to ensure her blood flow is still ?appropriate. That will be before her next evaluation with [place] next week. No fevers, chills, nausea, or vomiting noted at this time. Patient does have ?some discomfort rated to be a 3-4/10 depending on activity specifically cleansing the wound makes it worse. ?10/05/2017 -- the patient was seen by Dr. Lucky Cowboy last week and noninvasive studies showed a normal right ABI with brisk triphasic waveforms ?consistent with no arterial insufficiency including normal digital pressures. The duplex showed a patent distal right SFA stent and the proximal SFA ?was also normal. He was pleased with her test and thought she should have enough of perfusion for normal wound healing. He would see her back ?in 6 months time. ?12/21/17 on evaluation today patient appears to be doing fairly well in regard to her right lateral ankle wound. Unfortunately the main issue that ?she is expansion at this point is that she is having some issues with what appears to be some cellulitis in the right anterior shin. She has also been ?noting a little bit of uncomfortable feeling especially last night and her ankle area. I'm afraid that she made the developing a little bit of an ?infection. With that being said I think it is in the early stages. ?12/28/17 on evaluation today patient's ankle appears to be doing excellent. She's making good progress at this point the cellulitis seems to have ?improved after last week's evaluation. Overall she is having no significant discomfort which is excellent news. She does have an appointment with ?Dr. dew on March 29, 2018 for reevaluation in regard to the stent he placed. She seems  to have excellent blood flow in the right lower extremity. ?01/19/12 on evaluation today patient's wound appears to be doing very well. In fact she does not appear to require debridement at this point, ?th

## 2022-03-21 NOTE — Progress Notes (Signed)
[name], [name] ([id number]) ?Visit Report for 03/16/2022 ?Arrival Information Details ?Patient Name: [name], [name] ?Date of Service: 03/16/2022 10:15 AM ?Medical Record Number: [id number] ?Patient Account Number: [id number] ?Date of Birth/Sex: 1923/11/10 (88 y.o. F) ?Treating RN: Carlene Coria ?Primary Care Kaiyan Luczak: Adrian Prows Other Clinician: ?Referring Darshan Solanki: Adrian Prows ?Treating Randell Teare/Extender: Jeri Cos ?Weeks in Treatment: 61 ?Visit Information History Since Last Visit ?All ordered tests and consults were completed: No ?Patient Arrived: [name] ?Added or deleted any medications: No ?Arrival Time: 10:00 ?Any new allergies or adverse reactions: No ?Accompanied By: son ?Had a fall or experienced change in No ?Transfer Assistance: None ?activities of daily living that may affect ?Patient Identification Verified: Yes ?risk of falls: ?Secondary Verification Process Completed: Yes ?Signs or symptoms of abuse/neglect since last visito No ?Patient Requires Transmission-Based No ?Hospitalized since last visit: No ?Precautions: ?Implantable device outside of the clinic excluding No ?Patient Has Alerts: Yes ?cellular tissue based products placed in the center ?Patient Alerts: Patient on Blood ?since last visit: ?Thinner ?Has Dressing in Place as Prescribed: Yes ?NOT DIABETIC ?Pain Present Now: No ?***ELIQUIS*** ?Electronic Signature(s) ?Signed: 03/21/2022 9:16:22 AM By: Carlene Coria RN ?Entered By: Carlene Coria on 03/16/2022 10:07:57 ?XYLA, LEISNER ([id number]) ?-------------------------------------------------------------------------------- ?Clinic Level of Care Assessment Details ?Patient Name: [name], [name] ?Date of Service: 03/16/2022 10:15 AM ?Medical Record Number: [id number] ?Patient Account Number: [id number] ?Date of Birth/Sex: 29-Nov-1923 (88 y.o. F) ?Treating RN: Carlene Coria ?Primary Care Surah Pelley: Adrian Prows Other Clinician: ?Referring Florencio Hollibaugh: Adrian Prows ?Treating  Omair Dettmer/Extender: Jeri Cos ?Weeks in Treatment: 42 ?Clinic Level of Care Assessment Items ?TOOL 1 Quantity Score ?'[]'$  - Use when EandM and Procedure is performed on INITIAL visit 0 ?ASSESSMENTS - Nursing Assessment / Reassessment ?'[]'$  - General Physical Exam (combine w/ comprehensive assessment (listed just below) when performed on new ?0 ?pt. evals) ?'[]'$  - 0 ?Comprehensive Assessment (HX, ROS, Risk Assessments, Wounds Hx, etc.) ?ASSESSMENTS - Wound and Skin Assessment / Reassessment ?'[]'$  - Dermatologic / Skin Assessment (not related to wound area) 0 ?ASSESSMENTS - Ostomy and/or Continence Assessment and Care ?'[]'$  - Incontinence Assessment and Management 0 ?'[]'$  - 0 ?Ostomy Care Assessment and Management (repouching, etc.) ?PROCESS - Coordination of Care ?'[]'$  - Simple Patient / Family Education for ongoing care 0 ?'[]'$  - 0 ?Complex (extensive) Patient / Family Education for ongoing care ?'[]'$  - 0 ?Staff obtains Consents, Records, Test Results / Process Orders ?'[]'$  - 0 ?Staff telephones HHA, Nursing Homes / Clarify orders / etc ?'[]'$  - 0 ?Routine Transfer to another Facility (non-emergent condition) ?'[]'$  - 0 ?Routine Hospital Admission (non-emergent condition) ?'[]'$  - 0 ?New Admissions / Biomedical engineer / Ordering NPWT, Apligraf, etc. ?'[]'$  - 0 ?Emergency Hospital Admission (emergent condition) ?PROCESS - Special Needs ?'[]'$  - Pediatric / Minor Patient Management 0 ?'[]'$  - 0 ?Isolation Patient Management ?'[]'$  - 0 ?Hearing / Language / Visual special needs ?'[]'$  - 0 ?Assessment of Community assistance (transportation, D/C planning, etc.) ?'[]'$  - 0 ?Additional assistance / Altered mentation ?'[]'$  - 0 ?Support Surface(s) Assessment (bed, cushion, seat, etc.) ?INTERVENTIONS - Miscellaneous ?'[]'$  - External ear exam 0 ?'[]'$  - 0 ?Patient Transfer (multiple staff / Civil Service fast streamer / Similar devices) ?'[]'$  - 0 ?Simple Staple / Suture removal (25 or less) ?'[]'$  - 0 ?Complex Staple / Suture removal (26 or more) ?'[]'$  - 0 ?Hypo/Hyperglycemic Management (do not  check if billed separately) ?'[]'$  - 0 ?Ankle / Brachial Index (ABI) - do not check if billed separately ?Has the patient been seen at the hospital within  the last three years: Yes ?Total Score: 0 ?Level Of Care: ____ ?CHERRILL, SCRIMA ([id number]) ?Electronic Signature(s) ?Signed: 03/21/2022 9:16:22 AM By: Carlene Coria RN ?Entered By: Carlene Coria on 03/16/2022 10:35:30 ?ROBI, MITTER ([id number]) ?-------------------------------------------------------------------------------- ?Encounter Discharge Information Details ?Patient Name: [name], [name] ?Date of Service: 03/16/2022 10:15 AM ?Medical Record Number: [id number] ?Patient Account Number: [id number] ?Date of Birth/Sex: 11/14/1923 (88 y.o. F) ?Treating RN: Carlene Coria ?Primary Care Momin Misko: Adrian Prows Other Clinician: ?Referring Sherilynn Dieu: Adrian Prows ?Treating Illona Bulman/Extender: Jeri Cos ?Weeks in Treatment: 105 ?Encounter Discharge Information Items Post Procedure Vitals ?Discharge Condition: Stable ?Temperature (?F): 98.1 ?Ambulatory Status: Ambulatory ?Pulse (bpm): 77 ?Discharge Destination: Home ?Respiratory Rate (breaths/min): 18 ?Transportation: Private Auto ?Blood Pressure (mmHg): 146/83 ?Accompanied By: son ?Schedule Follow-up Appointment: Yes ?Clinical Summary of Care: Patient Declined ?Electronic Signature(s) ?Signed: 03/21/2022 9:16:22 AM By: Carlene Coria RN ?Entered By: Carlene Coria on 03/16/2022 10:37:14 ?PERCY, COMP ([id number]) ?-------------------------------------------------------------------------------- ?Lower Extremity Assessment Details ?Patient Name: [name], [name] ?Date of Service: 03/16/2022 10:15 AM ?Medical Record Number: [id number] ?Patient Account Number: [id number] ?Date of Birth/Sex: [birth year] (88 y.o. F) ?Treating RN: Carlene Coria ?Primary Care Kelwin Gibler: Adrian Prows Other Clinician: ?Referring Prachi Oftedahl: Adrian Prows ?Treating Harshil Cavallaro/Extender: Jeri Cos ?Weeks in Treatment: 67 ?Edema  Assessment ?Assessed: [Left: No] [Right: No] ?Edema: [Left: Ye] [Right: s] ?Calf ?Left: Right: ?Point of Measurement: 33 cm From Medial Instep 27 cm ?Ankle ?Left: Right: ?Point of Measurement: 10 cm From Medial Instep 17 cm ?Vascular Assessment ?Pulses: ?Dorsalis Pedis ?Palpable: [Right:Yes] ?Electronic Signature(s) ?Signed: 03/21/2022 9:16:22 AM By: Carlene Coria RN ?Entered By: Carlene Coria on 03/16/2022 10:18:11 ?KESLIE, GRITZ ([id number]) ?-------------------------------------------------------------------------------- ?Multi Wound Chart Details ?Patient Name: AZALEE, WEIMER ?Date of Service: 03/16/2022 10:15 AM ?Medical Record Number: [id number] ?Patient Account Number: [id number] ?Date of Birth/Sex: 1923/04/10 (88 y.o. F) ?Treating RN: Carlene Coria ?Primary Care Kaelen Brennan: Adrian Prows Other Clinician: ?Referring Lynell Kussman: Adrian Prows ?Treating Eliah Ozawa/Extender: Jeri Cos ?Weeks in Treatment: 65 ?Vital Signs ?Height(in): 62 ?Pulse(bpm): 77 ?Weight(lbs): 150 ?Blood Pressure(mmHg): 146/83 ?Body Mass Index(BMI): 27.4 ?Temperature(??F): 98.1 ?Respiratory Rate(breaths/min): 18 ?Photos: [N/A:N/A] ?Wound Location: Right, Lateral Malleolus N/A N/A ?Wounding Event: Gradually Appeared N/A N/A ?Primary Etiology: Pressure Ulcer N/A N/A ?Comorbid History: Cataracts, Congestive Heart Failure, N/A N/A ?Hypertension, Peripheral Arterial ?Disease, Osteoarthritis, Neuropathy ?Date Acquired: 05/12/2021 N/A N/A ?Weeks of Treatment: 47 N/A N/A ?Wound Status: Open N/A N/A ?Wound Recurrence: No N/A N/A ?Measurements L x W x D (cm) 0.9x0.8x0.2 N/A N/A ?Area (cm?) : 0.565 N/A N/A ?Volume (cm?) : 0.113 N/A N/A ?% Reduction in Area: 49.70% N/A N/A ?% Reduction in Volume: 49.80% N/A N/A ?Classification: Category/Stage III N/A N/A ?Exudate Amount: Medium N/A N/A ?Exudate Type: Serosanguineous N/A N/A ?Exudate Color: red, brown N/A N/A ?Granulation Amount: Medium (34-66%) N/A N/A ?Granulation Quality: Red N/A N/A ?Necrotic  Amount: Medium (34-66%) N/A N/A ?Exposed Structures: ?Fat Layer (Subcutaneous Tissue): N/A N/A ?Yes ?Fascia: No ?Tendon: No ?Muscle: No ?Joint: No ?Bone: No ?Epithelialization: None N/A N/A ?Treatment N

## 2022-03-23 ENCOUNTER — Encounter: Payer: Medicare Other | Admitting: Physician Assistant

## 2022-03-23 DIAGNOSIS — L89513 Pressure ulcer of right ankle, stage 3: Secondary | ICD-10-CM | POA: Diagnosis not present

## 2022-03-23 NOTE — Progress Notes (Addendum)
LILO, WALLINGTON ([id number]) ?Visit Report for 03/23/2022 ?Chief Complaint Document Details ?Patient Name: GRADY, LUCCI ?Date of Service: 03/23/2022 10:15 AM ?Medical Record Number: [id number] ?Patient Account Number: [id number] ?Date of Birth/Sex: 1923/09/23 (88 y.o. F) ?Treating RN: Carlene Coria ?Primary Care Provider: Adrian Prows Other Clinician: ?Referring Provider: Adrian Prows ?Treating Provider/Extender: Jeri Cos ?Weeks in Treatment: 44 ?Information Obtained from: Patient ?Chief Complaint ?Right foot ulcer ?Electronic Signature(s) ?Signed: 03/23/2022 10:29:03 AM By: Worthy Keeler PA-C ?Entered By: Worthy Keeler on 03/23/2022 10:29:03 ?BAYLYN, SICKLES ([id number]) ?-------------------------------------------------------------------------------- ?Debridement Details ?Patient Name: CRISTALLE, ROHM ?Date of Service: 03/23/2022 10:15 AM ?Medical Record Number: [id number] ?Patient Account Number: [id number] ?Date of Birth/Sex: 05/20/1923 (88 y.o. F) ?Treating RN: Carlene Coria ?Primary Care Provider: Adrian Prows Other Clinician: ?Referring Provider: Adrian Prows ?Treating Provider/Extender: Jeri Cos ?Weeks in Treatment: 44 ?Debridement Performed for ?Wound #7 Right,Lateral Malleolus ?Assessment: ?Performed By: Physician Tommie Sams., PA-C ?Debridement Type: Debridement ?Level of Consciousness (Pre- ?Awake and Alert ?procedure): ?Pre-procedure Verification/Time Out ?Yes - 10:57 ?Taken: ?Start Time: 10:57 ?Total Area Debrided (L x W): 1 (cm) x 1 (cm) = 1 (cm?) ?Tissue and other material ?Viable, Non-Viable, Slough, Subcutaneous, Skin: Dermis , Skin: Epidermis, Slough ?debrided: ?Level: Skin/Subcutaneous Tissue ?Debridement Description: Excisional ?Instrument: Curette ?Bleeding: Minimum ?Hemostasis Achieved: Pressure ?End Time: 11:00 ?Procedural Pain: 0 ?Post Procedural Pain: 0 ?Response to Treatment: Procedure was tolerated well ?Level of Consciousness (Post- ?Awake and  Alert ?procedure): ?Post Debridement Measurements of Total Wound ?Length: (cm) 0.9 ?Stage: Category/Stage III ?Width: (cm) 0.8 ?Depth: (cm) 0.2 ?Volume: (cm?) 0.113 ?Character of Wound/Ulcer Post Debridement: Improved ?Post Procedure Diagnosis ?Same as Pre-procedure ?Electronic Signature(s) ?Signed: 03/24/2022 4:04:50 PM By: Carlene Coria RN ?Signed: 03/24/2022 5:32:35 PM By: Worthy Keeler PA-C ?Entered By: Carlene Coria on 03/23/2022 10:59:56 ?SHYLER, HAMILL ([id number]) ?-------------------------------------------------------------------------------- ?HPI Details ?Patient Name: CHARDAY, CAPETILLO ?Date of Service: 03/23/2022 10:15 AM ?Medical Record Number: [id number] ?Patient Account Number: [id number] ?Date of Birth/Sex: November 05, 1923 (88 y.o. 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 Dr. Amalia Hailey the podiatrist saw her on 07/20/2017 and sent her to the ?wound center for possible hyperbaric oxygen therapy. ?past medical history of peripheral vascular disease, varicose veins, status post appendectomy, basal cell carcinoma excision from the left leg, ?cholecystectomy, pacemaker placement, right lower extremity angiography done by Dr. dew in March 2017 with placement of a stent. there is ?also note of a successful ablation of the right small saphenous vein done which was reviewed by ultrasound on 10/24/2016. the patient had a ?right small saphenous vein ablation done on 10/20/2016. The patient has never been a smoker. ?She has been seen by Dr. Corene Cornea dew the vascular surgeon who most recently saw her on 06/15/2017 for evaluation of  ongoing problems with ?right leg swelling. She had a lower extremity arterial duplex examination done(02/13/17) which showed patent distal right superficial femoral ?artery stent and above-the-knee popliteal stent without evidence of restenosis. The ABI was more than 1.3 on the right and more than 1.3 on the ?left. This was consistent with noncompressible arteries due to medial calcification. The right great toe pressure and PPG waveforms are within ?normal limits and the left great toe pressure and PPG waveforms  are decreased. ?he recommended she continue to wear her compression stockings and continue with elevation. She is scheduled to have a noninvasive arterial ?study in the near future ?08/16/2017 -- had a lower extremity arterial duplex examination done which showed patent distal right superficial femoral artery stent and ?above-the-knee popliteal stent without evidence of restenosis. The ABI was more than 1.3 on the right and more than 1.3 on the left. This was ?consistent with noncompressible arteries due to medial calcification. The right great toe pressure and PPG waveforms are within normal limits and ?the left great toe pressure and PPG waveforms are decreased. ?the x-ray of the right ankle has not yet been done ?08/24/2017 -- had a right ankle x-ray -- IMPRESSION:1. No fracture, bone lesion or evidence of osteomyelitis. 2. Lateral soft tissue swelling with ?a soft tissue ulcer. ?she has not yet seen the vascular surgeon for review ?08/31/17 on evaluation today patient's wound appears to be showing signs of improvement. She still with her appointment with vascular in order ?to review her results of her vascular study and then determine if any intervention would be recommended at that time. No fevers, chills, nausea, ?or vomiting noted at this time. She has been tolerating the dressing changes without complication. ?09/28/17 on evaluation today patient's wound appears to show signs of good improvement in  regard to the granulation tissue which is surfacing. ?There is still a layer of slough covering the wound and the posterior portion is still significantly deeper than the anterior nonetheless there has ?been some good sign of things moving towards the better. She is going to go back to Dr. dew for reevaluation to ensure her blood flow is still ?appropriate. That will be before her next evaluation with [place] next week. No fevers, chills, nausea, or vomiting noted at this time. Patient does have ?some discomfort rated to be a 3-4/10 depending on activity specifically cleansing the wound makes it worse. ?10/05/2017 -- the patient was seen by Dr. Lucky Cowboy last week and noninvasive studies showed a normal right ABI with brisk triphasic waveforms ?consistent with no arterial insufficiency including normal digital pressures. The duplex showed a patent distal right SFA stent and the proximal SFA ?was also normal. He was pleased with her test and thought she should have enough of perfusion for normal wound healing. He would see her back ?in 6 months time. ?12/21/17 on evaluation today patient appears to be doing fairly well in regard to her right lateral ankle wound. Unfortunately the main issue that ?she is expansion at this point is that she is having some issues with what appears to be some cellulitis in the right anterior shin. She has also been ?noting a little bit of uncomfortable feeling especially last night and her ankle area. I'm afraid that she made the developing a little bit of an ?infection. With that being said I think it is in the early stages. ?12/28/17 on evaluation today patient's ankle appears to be doing excellent. She's making good progress at this point the cellulitis seems to have ?improved after last week's evaluation. Overall she is having no significant discomfort which is excellent news. She does have an appointment with ?Dr. dew on March 29, 2018 for reevaluation in regard to the stent he placed. She seems  to have excellent blood flow in the right lower extremity. ?01/19/12 on evaluation today patient's wound appears to be doing very well. In fact she does not appear to require debridement at this point, ?there's n

## 2022-03-24 NOTE — Progress Notes (Signed)
[name] ([id number]) ?Visit Report for 03/23/2022 ?Arrival Information Details ?Patient Name: [name], [name] ?Date of Service: 03/23/2022 10:15 AM ?Medical Record Number: [id number] ?Patient Account Number: [id number] ?Date of Birth/Sex: 1923-07-08 (88 y.o. F) ?Treating RN: Carlene Coria ?Primary Care Doral Digangi: Adrian Prows Other Clinician: ?Referring Emmery Seiler: Adrian Prows ?Treating Mystery Schrupp/Extender: Jeri Cos ?Weeks in Treatment: 44 ?Visit Information History Since Last Visit ?All ordered tests and consults were completed: No ?Patient Arrived: [name] ?Added or deleted any medications: No ?Arrival Time: 10:20 ?Any new allergies or adverse reactions: No ?Accompanied By: self ?Had a fall or experienced change in No ?Transfer Assistance: None ?activities of daily living that may affect ?Patient Identification Verified: Yes ?risk of falls: ?Secondary Verification Process Completed: Yes ?Signs or symptoms of abuse/neglect since last visito No ?Patient Requires Transmission-Based No ?Hospitalized since last visit: No ?Precautions: ?Implantable device outside of the clinic excluding No ?Patient Has Alerts: Yes ?cellular tissue based products placed in the center ?Patient Alerts: Patient on Blood ?since last visit: ?Thinner ?Has Dressing in Place as Prescribed: Yes ?NOT DIABETIC ?Has Compression in Place as Prescribed: Yes ?***ELIQUIS*** ?Pain Present Now: No ?Electronic Signature(s) ?Signed: 03/24/2022 4:04:50 PM By: Carlene Coria RN ?Entered By: Carlene Coria on 03/23/2022 10:26:14 ?KATIRA, DUMAIS ([id number]) ?-------------------------------------------------------------------------------- ?Clinic Level of Care Assessment Details ?Patient Name: [name], [name] ?Date of Service: 03/23/2022 10:15 AM ?Medical Record Number: [id number] ?Patient Account Number: [id number] ?Date of Birth/Sex: [birth year] (88 y.o. F) ?Treating RN: Carlene Coria ?Primary Care Shaima Sardinas: Adrian Prows Other  Clinician: ?Referring Madhavi Hamblen: Adrian Prows ?Treating Kyllian Clingerman/Extender: Jeri Cos ?Weeks in Treatment: 44 ?Clinic Level of Care Assessment Items ?TOOL 1 Quantity Score ?'[]'$  - Use when EandM and Procedure is performed on INITIAL visit 0 ?ASSESSMENTS - Nursing Assessment / Reassessment ?'[]'$  - General Physical Exam (combine w/ comprehensive assessment (listed just below) when performed on new ?0 ?pt. evals) ?'[]'$  - 0 ?Comprehensive Assessment (HX, ROS, Risk Assessments, Wounds Hx, etc.) ?ASSESSMENTS - Wound and Skin Assessment / Reassessment ?'[]'$  - Dermatologic / Skin Assessment (not related to wound area) 0 ?ASSESSMENTS - Ostomy and/or Continence Assessment and Care ?'[]'$  - Incontinence Assessment and Management 0 ?'[]'$  - 0 ?Ostomy Care Assessment and Management (repouching, etc.) ?PROCESS - Coordination of Care ?'[]'$  - Simple Patient / Family Education for ongoing care 0 ?'[]'$  - 0 ?Complex (extensive) Patient / Family Education for ongoing care ?'[]'$  - 0 ?Staff obtains Consents, Records, Test Results / Process Orders ?'[]'$  - 0 ?Staff telephones HHA, Nursing Homes / Clarify orders / etc ?'[]'$  - 0 ?Routine Transfer to another Facility (non-emergent condition) ?'[]'$  - 0 ?Routine Hospital Admission (non-emergent condition) ?'[]'$  - 0 ?New Admissions / Biomedical engineer / Ordering NPWT, Apligraf, etc. ?'[]'$  - 0 ?Emergency Hospital Admission (emergent condition) ?PROCESS - Special Needs ?'[]'$  - Pediatric / Minor Patient Management 0 ?'[]'$  - 0 ?Isolation Patient Management ?'[]'$  - 0 ?Hearing / Language / Visual special needs ?'[]'$  - 0 ?Assessment of Community assistance (transportation, D/C planning, etc.) ?'[]'$  - 0 ?Additional assistance / Altered mentation ?'[]'$  - 0 ?Support Surface(s) Assessment (bed, cushion, seat, etc.) ?INTERVENTIONS - Miscellaneous ?'[]'$  - External ear exam 0 ?'[]'$  - 0 ?Patient Transfer (multiple staff / Civil Service fast streamer / Similar devices) ?'[]'$  - 0 ?Simple Staple / Suture removal (25 or less) ?'[]'$  - 0 ?Complex Staple / Suture removal  (26 or more) ?'[]'$  - 0 ?Hypo/Hyperglycemic Management (do not check if billed separately) ?'[]'$  - 0 ?Ankle / Brachial Index (ABI) - do not check if billed separately ?Has the  patient been seen at the hospital within the last three years: Yes ?Total Score: 0 ?Level Of Care: ____ ?KETTY, BITTON ([id number]) ?Electronic Signature(s) ?Signed: 03/24/2022 4:04:50 PM By: Carlene Coria RN ?Entered By: Carlene Coria on 03/23/2022 11:55:26 ?ANASTAZJA, ISAAC ([id number]) ?-------------------------------------------------------------------------------- ?Encounter Discharge Information Details ?Patient Name: [name] ?Date of Service: 03/23/2022 10:15 AM ?Medical Record Number: [id number] ?Patient Account Number: [id number] ?Date of Birth/Sex: [birth year] (88 y.o. F) ?Treating RN: Carlene Coria ?Primary Care Lutricia Widjaja: Adrian Prows Other Clinician: ?Referring Jennise Both: Adrian Prows ?Treating Necie Wilcoxson/Extender: Jeri Cos ?Weeks in Treatment: 44 ?Encounter Discharge Information Items Post Procedure Vitals ?Discharge Condition: Stable ?Temperature (?F): 98.2 ?Ambulatory Status: [name] ?Pulse (bpm): 73 ?Discharge Destination: Home ?Respiratory Rate (breaths/min): 18 ?Transportation: Private Auto ?Blood Pressure (mmHg): 145/75 ?Accompanied By: self ?Schedule Follow-up Appointment: Yes ?Clinical Summary of Care: Patient Declined ?Electronic Signature(s) ?Signed: 03/23/2022 11:58:04 AM By: Carlene Coria RN ?Entered By: Carlene Coria on 03/23/2022 11:58:03 ?FATE, GALANTI ([id number]) ?-------------------------------------------------------------------------------- ?Lower Extremity Assessment Details ?Patient Name: [name], [name] ?Date of Service: 03/23/2022 10:15 AM ?Medical Record Number: [id number] ?Patient Account Number: [id number] ?Date of Birth/Sex: [birth year] (88 y.o. F) ?Treating RN: Carlene Coria ?Primary Care Deaja Rizo: Adrian Prows Other Clinician: ?Referring Ezma Rehm: Adrian Prows ?Treating  Zakaree Mcclenahan/Extender: Jeri Cos ?Weeks in Treatment: 44 ?Edema Assessment ?Assessed: [Left: No] [Right: No] ?Edema: [Left: N] [Right: o] ?Calf ?Left: Right: ?Point of Measurement: 33 cm From Medial Instep 27 cm ?Ankle ?Left: Right: ?Point of Measurement: 10 cm From Medial Instep 17 cm ?Vascular Assessment ?Pulses: ?Dorsalis Pedis ?Palpable: [Right:Yes] ?Electronic Signature(s) ?Signed: 03/24/2022 4:04:50 PM By: Carlene Coria RN ?Entered By: Carlene Coria on 03/23/2022 10:35:11 ?JAELEAH, SMYSER ([id number]) ?-------------------------------------------------------------------------------- ?Multi Wound Chart Details ?Patient Name: [name], [name] ?Date of Service: 03/23/2022 10:15 AM ?Medical Record Number: [id number] ?Patient Account Number: [id number] ?Date of Birth/Sex: June 06, 1923 (88 y.o. F) ?Treating RN: Carlene Coria ?Primary Care Kierra Jezewski: Adrian Prows Other Clinician: ?Referring Mackinley Cassaday: Adrian Prows ?Treating Kire Ferg/Extender: Jeri Cos ?Weeks in Treatment: 44 ?Vital Signs ?Height(in): 62 ?Pulse(bpm): 73 ?Weight(lbs): 150 ?Blood Pressure(mmHg): 145/75 ?Body Mass Index(BMI): 27.4 ?Temperature(??F): 98.2 ?Respiratory Rate(breaths/min): 18 ?Photos: [N/A:N/A] ?Wound Location: Right, Lateral Malleolus N/A N/A ?Wounding Event: Gradually Appeared N/A N/A ?Primary Etiology: Pressure Ulcer N/A N/A ?Comorbid History: Cataracts, Congestive Heart Failure, N/A N/A ?Hypertension, Peripheral Arterial ?Disease, Osteoarthritis, Neuropathy ?Date Acquired: 05/12/2021 N/A N/A ?Weeks of Treatment: 65 N/A N/A ?Wound Status: Open N/A N/A ?Wound Recurrence: No N/A N/A ?Measurements L x W x D (cm) 0.9x0.8x0.2 N/A N/A ?Area (cm?) : 0.565 N/A N/A ?Volume (cm?) : 0.113 N/A N/A ?% Reduction in Area: 49.70% N/A N/A ?% Reduction in Volume: 49.80% N/A N/A ?Classification: Category/Stage III N/A N/A ?Exudate Amount: Medium N/A N/A ?Exudate Type: Serosanguineous N/A N/A ?Exudate Color: red, brown N/A N/A ?Granulation Amount: Medium  (34-66%) N/A N/A ?Granulation Quality: Red N/A N/A ?Necrotic Amount: Medium (34-66%) N/A N/A ?Exposed Structures: ?Fat Layer (Subcutaneous Tissue): N/A N/A ?Yes ?Fascia: No ?Tendon: No ?Muscle: No ?Joint: No ?Bone: No ?E

## 2022-03-30 ENCOUNTER — Encounter: Payer: Medicare Other | Admitting: Internal Medicine

## 2022-03-30 DIAGNOSIS — L89513 Pressure ulcer of right ankle, stage 3: Secondary | ICD-10-CM | POA: Diagnosis not present

## 2022-03-31 NOTE — Progress Notes (Signed)
DAVID, TOWSON ([id number]) ?Visit Report for 03/30/2022 ?Arrival Information Details ?Patient Name: [name], [name] ?Date of Service: 03/30/2022 10:15 AM ?Medical Record Number: [id number] ?Patient Account Number: [id number] ?Date of Birth/Sex: 01-06-1923 (88 y.o. F) ?Treating RN: Carlene Coria ?Primary Care Tammera Engert: Adrian Prows Other Clinician: ?Referring Daud Cayer: Adrian Prows ?Treating Diar Berkel/Extender: Ricard Dillon ?Weeks in Treatment: 8 ?Visit Information History Since Last Visit ?All ordered tests and consults were completed: No ?Patient Arrived: Ambulatory ?Added or deleted any medications: No ?Arrival Time: 10:12 ?Any new allergies or adverse reactions: No ?Accompanied By: self ?Had a fall or experienced change in No ?Transfer Assistance: None ?activities of daily living that may affect ?Patient Identification Verified: Yes ?risk of falls: ?Secondary Verification Process Completed: Yes ?Signs or symptoms of abuse/neglect since last visito No ?Patient Requires Transmission-Based No ?Hospitalized since last visit: No ?Precautions: ?Implantable device outside of the clinic excluding No ?Patient Has Alerts: Yes ?cellular tissue based products placed in the center ?Patient Alerts: Patient on Blood ?since last visit: ?Thinner ?Has Dressing in Place as Prescribed: Yes ?NOT DIABETIC ?Pain Present Now: No ?***ELIQUIS*** ?Electronic Signature(s) ?Signed: 03/31/2022 1:52:28 PM By: Carlene Coria RN ?Entered By: Carlene Coria on 03/30/2022 10:16:32 ?CHERISA, BRUCKER ([id number]) ?-------------------------------------------------------------------------------- ?Clinic Level of Care Assessment Details ?Patient Name: [name], [name] ?Date of Service: 03/30/2022 10:15 AM ?Medical Record Number: [id number] ?Patient Account Number: [id number] ?Date of Birth/Sex: 18-Feb-1923 (88 y.o. F) ?Treating RN: Carlene Coria ?Primary Care Kawon Willcutt: Adrian Prows Other Clinician: ?Referring Djuan Talton: Adrian Prows ?Treating Autie Vasudevan/Extender: Ricard Dillon ?Weeks in Treatment: 25 ?Clinic Level of Care Assessment Items ?TOOL 1 Quantity Score ?'[]'$  - Use when EandM and Procedure is performed on INITIAL visit 0 ?ASSESSMENTS - Nursing Assessment / Reassessment ?'[]'$  - General Physical Exam (combine w/ comprehensive assessment (listed just below) when performed on new ?0 ?pt. evals) ?'[]'$  - 0 ?Comprehensive Assessment (HX, ROS, Risk Assessments, Wounds Hx, etc.) ?ASSESSMENTS - Wound and Skin Assessment / Reassessment ?'[]'$  - Dermatologic / Skin Assessment (not related to wound area) 0 ?ASSESSMENTS - Ostomy and/or Continence Assessment and Care ?'[]'$  - Incontinence Assessment and Management 0 ?'[]'$  - 0 ?Ostomy Care Assessment and Management (repouching, etc.) ?PROCESS - Coordination of Care ?'[]'$  - Simple Patient / Family Education for ongoing care 0 ?'[]'$  - 0 ?Complex (extensive) Patient / Family Education for ongoing care ?'[]'$  - 0 ?Staff obtains Consents, Records, Test Results / Process Orders ?'[]'$  - 0 ?Staff telephones HHA, Nursing Homes / Clarify orders / etc ?'[]'$  - 0 ?Routine Transfer to another Facility (non-emergent condition) ?'[]'$  - 0 ?Routine Hospital Admission (non-emergent condition) ?'[]'$  - 0 ?New Admissions / Biomedical engineer / Ordering NPWT, Apligraf, etc. ?'[]'$  - 0 ?Emergency Hospital Admission (emergent condition) ?PROCESS - Special Needs ?'[]'$  - Pediatric / Minor Patient Management 0 ?'[]'$  - 0 ?Isolation Patient Management ?'[]'$  - 0 ?Hearing / Language / Visual special needs ?'[]'$  - 0 ?Assessment of Community assistance (transportation, D/C planning, etc.) ?'[]'$  - 0 ?Additional assistance / Altered mentation ?'[]'$  - 0 ?Support Surface(s) Assessment (bed, cushion, seat, etc.) ?INTERVENTIONS - Miscellaneous ?'[]'$  - External ear exam 0 ?'[]'$  - 0 ?Patient Transfer (multiple staff / Civil Service fast streamer / Similar devices) ?'[]'$  - 0 ?Simple Staple / Suture removal (25 or less) ?'[]'$  - 0 ?Complex Staple / Suture removal (26 or more) ?'[]'$  -  0 ?Hypo/Hyperglycemic Management (do not check if billed separately) ?'[]'$  - 0 ?Ankle / Brachial Index (ABI) - do not check if billed separately ?Has the patient been seen at the  hospital within the last three years: Yes ?Total Score: 0 ?Level Of Care: ____ ?ELANIA, CROWL ([id number]) ?Electronic Signature(s) ?Signed: 03/31/2022 1:52:28 PM By: Carlene Coria RN ?Entered By: Carlene Coria on 03/30/2022 10:45:24 ?MARAJADE, LEI ([id number]) ?-------------------------------------------------------------------------------- ?Encounter Discharge Information Details ?Patient Name: [name], [name] ?Date of Service: 03/30/2022 10:15 AM ?Medical Record Number: [id number] ?Patient Account Number: [id number] ?Date of Birth/Sex: [birth year] (88 y.o. F) ?Treating RN: Carlene Coria ?Primary Care March Joos: Adrian Prows Other Clinician: ?Referring Alyla Pietila: Adrian Prows ?Treating Cloyde Oregel/Extender: Ricard Dillon ?Weeks in Treatment: 62 ?Encounter Discharge Information Items Post Procedure Vitals ?Discharge Condition: Stable ?Temperature (?F): 98.2 ?Ambulatory Status: Ambulatory ?Pulse (bpm): 80 ?Discharge Destination: Home ?Respiratory Rate (breaths/min): 18 ?Transportation: Private Auto ?Blood Pressure (mmHg): 132/75 ?Accompanied By: daughter ?Schedule Follow-up Appointment: Yes ?Clinical Summary of Care: Patient Declined ?Electronic Signature(s) ?Signed: 03/31/2022 1:52:28 PM By: Carlene Coria RN ?Entered By: Carlene Coria on 03/30/2022 10:46:31 ?JULINA, ALTMANN ([id number]) ?-------------------------------------------------------------------------------- ?Lower Extremity Assessment Details ?Patient Name: [name], [name] ?Date of Service: 03/30/2022 10:15 AM ?Medical Record Number: [id number] ?Patient Account Number: [id number] ?Date of Birth/Sex: [birth year] (88 y.o. F) ?Treating RN: Carlene Coria ?Primary Care Durelle Zepeda: Adrian Prows Other Clinician: ?Referring Breyonna Nault: Adrian Prows ?Treating  Nishi Neiswonger/Extender: Ricard Dillon ?Weeks in Treatment: 36 ?Edema Assessment ?Assessed: [Left: No] [Right: No] ?Edema: [Left: Ye] [Right: s] ?Calf ?Left: Right: ?Point of Measurement: 33 cm From Medial Instep 27 cm ?Ankle ?Left: Right: ?Point of Measurement: 10 cm From Medial Instep 17 cm ?Vascular Assessment ?Pulses: ?Dorsalis Pedis ?Palpable: [Right:Yes] ?Electronic Signature(s) ?Signed: 03/31/2022 1:52:28 PM By: Carlene Coria RN ?Entered ByCarlene Coria on 03/30/2022 10:28:21 ?ANGELLINA, FERDINAND ([id number]) ?-------------------------------------------------------------------------------- ?Multi Wound Chart Details ?Patient Name: [name], [name] ?Date of Service: 03/30/2022 10:15 AM ?Medical Record Number: [id number] ?Patient Account Number: [id number] ?Date of Birth/Sex: 10-Dec-1922 (88 y.o. F) ?Treating RN: Carlene Coria ?Primary Care Mills Mitton: Adrian Prows Other Clinician: ?Referring Mckenna Boruff: Adrian Prows ?Treating Blyss Lugar/Extender: Ricard Dillon ?Weeks in Treatment: 70 ?Vital Signs ?Height(in): 62 ?Pulse(bpm): 80 ?Weight(lbs): 150 ?Blood Pressure(mmHg): 132/75 ?Body Mass Index(BMI): 27.4 ?Temperature(??F): 98.2 ?Respiratory Rate(breaths/min): 18 ?Photos: [N/A:N/A] ?Wound Location: Right, Lateral Malleolus N/A N/A ?Wounding Event: Gradually Appeared N/A N/A ?Primary Etiology: Pressure Ulcer N/A N/A ?Comorbid History: Cataracts, Congestive Heart Failure, N/A N/A ?Hypertension, Peripheral Arterial ?Disease, Osteoarthritis, Neuropathy ?Date Acquired: 05/12/2021 N/A N/A ?Weeks of Treatment: 45 N/A N/A ?Wound Status: Open N/A N/A ?Wound Recurrence: No N/A N/A ?Measurements L x W x D (cm) 0.9x0.9x0.2 N/A N/A ?Area (cm?) : 0.636 N/A N/A ?Volume (cm?) : 0.127 N/A N/A ?% Reduction in Area: 43.40% N/A N/A ?% Reduction in Volume: 43.60% N/A N/A ?Classification: Category/Stage III N/A N/A ?Exudate Amount: Medium N/A N/A ?Exudate Type: Serosanguineous N/A N/A ?Exudate Color: red, brown N/A N/A ?Granulation  Amount: Medium (34-66%) N/A N/A ?Granulation Quality: Red N/A N/A ?Necrotic Amount: Medium (34-66%) N/A N/A ?Exposed Structures: ?Fat Layer (Subcutaneous Tissue): N/A N/A ?Yes ?Fascia: No ?Tendon: No ?Muscle: No ?Joint: No ?Bone: No ?Epit

## 2022-03-31 NOTE — Progress Notes (Addendum)
[name], [name] ([id number]) ?Visit Report for 03/30/2022 ?Debridement Details ?Patient Name: [name], [name] ?Date of Service: 03/30/2022 10:15 AM ?Medical Record Number: [id number] ?Patient Account Number: [id number] ?Date of Birth/Sex: 10-Nov-1923 (88 y.o. F) ?Treating RN: Carlene Coria ?Primary Care Provider: Adrian Prows Other Clinician: ?Referring Provider: Adrian Prows ?Treating Provider/Extender: Ricard Dillon ?Weeks in Treatment: 61 ?Debridement Performed for ?Wound #7 Right,Lateral Malleolus ?Assessment: ?Performed By: Physician Ricard Dillon, MD ?Debridement Type: Debridement ?Level of Consciousness (Pre- ?Awake and Alert ?procedure): ?Pre-procedure Verification/Time Out ?Yes - 10:40 ?Taken: ?Start Time: 10:40 ?Pain Control: Lidocaine 4% Topical Solution ?Total Area Debrided (L x W): 0.9 (cm) x 0.9 (cm) = 0.81 (cm?) ?Tissue and other material ?Viable, Non-Viable, Callus, Slough, Subcutaneous, Skin: Dermis , Skin: Epidermis, Biofilm, Slough ?debrided: ?Level: Skin/Subcutaneous Tissue ?Debridement Description: Excisional ?Instrument: Curette ?Bleeding: Moderate ?Hemostasis Achieved: Pressure ?End Time: 10:44 ?Procedural Pain: 0 ?Post Procedural Pain: 0 ?Response to Treatment: Procedure was tolerated well ?Level of Consciousness (Post- ?Awake and Alert ?procedure): ?Post Debridement Measurements of Total Wound ?Length: (cm) 0.9 ?Stage: Category/Stage III ?Width: (cm) 0.9 ?Depth: (cm) 0.2 ?Volume: (cm?) 0.127 ?Character of Wound/Ulcer Post Debridement: Improved ?Post Procedure Diagnosis ?Same as Pre-procedure ?Electronic Signature(s) ?Signed: 03/30/2022 4:18:52 PM By: Linton Ham MD ?Signed: 03/31/2022 1:52:28 PM By: Carlene Coria RN ?Entered By: Linton Ham on 03/30/2022 11:13:20 ?JUNI, GLAAB ([id number]) ?-------------------------------------------------------------------------------- ?HPI Details ?Patient Name: [name], [name] ?Date of Service: 03/30/2022 10:15 AM ?Medical  Record Number: [id number] ?Patient Account Number: [id number] ?Date of Birth/Sex: 02/19/1923 (88 y.o. 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 Dr. Amalia Hailey the podiatrist saw her on 07/20/2017 and sent her to the ?wound center for possible hyperbaric oxygen therapy. ?past medical history of peripheral vascular disease, varicose veins, status post appendectomy, basal cell carcinoma excision from the left leg, ?cholecystectomy, pacemaker placement, right lower extremity angiography done by Dr. dew in March 2017 with placement of a stent. there is ?also note of a successful ablation of the right small saphenous vein done which was reviewed by ultrasound on 10/24/2016. the patient had a ?right small saphenous vein ablation done on 10/20/2016. The patient has never been a smoker. ?She has been seen by Dr. Corene Cornea dew the vascular surgeon who most recently saw her on 06/15/2017 for evaluation of ongoing problems with ?right leg swelling. She had a lower extremity arterial duplex examination done(02/13/17) which showed patent distal right superficial femoral ?artery stent and above-the-knee popliteal stent without evidence of restenosis. The ABI was more than 1.3 on the right and more than 1.3 on the ?left. This was consistent with noncompressible arteries due to medial calcification. The right great toe pressure and PPG waveforms are within ?normal limits and the left great toe pressure and PPG waveforms are decreased. ?he recommended she continue to wear her compression stockings and continue with elevation. She is scheduled to have a  noninvasive arterial ?study in the near future ?08/16/2017 -- had a lower extremity arterial duplex examination done which showed patent distal right superficial femoral artery stent and ?above-the-knee popliteal stent without evidence of restenosis. The ABI was more than 1.3 on the right and more than 1.3 on the left. This was ?consistent with noncompressible arteries  due to medial calcification. The right great toe pressure and PPG waveforms are within normal limits and ?the left great toe pressure and PPG waveforms are decreased. ?the x-ray of the right ankle has not yet been done ?08/24/2017 -- had a right ankle x-ray -- IMPRESSION:1. No fracture, bone lesion or evidence of osteomyelitis. 2. Lateral soft tissue swelling with ?a soft tissue ulcer. ?she has not yet seen the vascular surgeon for review ?08/31/17 on evaluation today patient's wound appears to be showing signs of improvement. She still with her appointment with vascular in order ?to review her results of her vascular study and then determine if any intervention would be recommended at that time. No fevers, chills, nausea, ?or vomiting noted at this time. She has been tolerating the dressing changes without complication. ?09/28/17 on evaluation today patient's wound appears to show signs of good improvement in regard to the granulation tissue which is surfacing. ?There is still a layer of slough covering the wound and the posterior portion is still significantly deeper than the anterior nonetheless there has ?been some good sign of things moving towards the better. She is going to go back to Dr. dew for reevaluation to ensure her blood flow is still ?appropriate. That will be before her next evaluation with [place] next week. No fevers, chills, nausea, or vomiting noted at this time. Patient does have ?some discomfort rated to be a 3-4/10 depending on activity specifically cleansing the wound makes it worse. ?10/05/2017 -- the patient was seen by Dr. Lucky Cowboy  last week and noninvasive studies showed a normal right ABI with brisk triphasic waveforms ?consistent with no arterial insufficiency including normal digital pressures. The duplex showed a patent distal right SFA stent and the proximal SFA ?was also normal. He was pleased with her test and thought she should have enough of perfusion for normal wound healing. He would see her back ?in 6 months time. ?12/21/17 on evaluation today patient appears to be doing fairly well in regard to her right lateral ankle wound. Unfortunately the main issue that ?she is expansion at this point is that she is having some issues with what appears to be some cellulitis in the right anterior shin. She has also been ?noting a little bit of uncomfortable feeling especially last night and her ankle area. I'm afraid that she made the developing a little bit of an ?infection. With that being said I think it is in the early stages. ?12/28/17 on evaluation today patient's ankle appears to be doing excellent. She's making good progress at this point the cellulitis seems to have ?improved after last week's evaluation. Overall she is having no significant discomfort which is excellent news. She does have an appointment with ?Dr. dew on March 29, 2018 for reevaluation in regard to the stent he placed. She seems to have excellent blood flow in the right lower extremity. ?01/19/12 on evaluation today patient's wound appears to be doing very well. In fact she does not appear to require debridement at this point, ?there's no evidence of infection, and overall from the standpoint of the wound she seems to be doing very well. With that being said I believe that ?it may be time to switch to different dressing away from the [facility] Dressing she tells me she does have a lot going on her friend actually ?passed away yesterday and she's also having a lot of issues with her husband this obviously is weighing heavy on her as far as your thoughts  and ?concerns  today. ?01/25/18 on evaluation today patient appears to be doing fairly well in regard to her right lateral malleolus. She has been tolerating the dressing ?changes without complication. Overall I fee

## 2022-04-06 ENCOUNTER — Encounter: Payer: Medicare Other | Attending: Physician Assistant | Admitting: Physician Assistant

## 2022-04-06 DIAGNOSIS — I890 Lymphedema, not elsewhere classified: Secondary | ICD-10-CM | POA: Diagnosis not present

## 2022-04-06 DIAGNOSIS — G629 Polyneuropathy, unspecified: Secondary | ICD-10-CM | POA: Diagnosis not present

## 2022-04-06 DIAGNOSIS — I10 Essential (primary) hypertension: Secondary | ICD-10-CM | POA: Insufficient documentation

## 2022-04-06 DIAGNOSIS — M1990 Unspecified osteoarthritis, unspecified site: Secondary | ICD-10-CM | POA: Insufficient documentation

## 2022-04-06 DIAGNOSIS — Z85828 Personal history of other malignant neoplasm of skin: Secondary | ICD-10-CM | POA: Insufficient documentation

## 2022-04-06 DIAGNOSIS — I739 Peripheral vascular disease, unspecified: Secondary | ICD-10-CM | POA: Insufficient documentation

## 2022-04-06 DIAGNOSIS — L89513 Pressure ulcer of right ankle, stage 3: Secondary | ICD-10-CM | POA: Insufficient documentation

## 2022-04-06 DIAGNOSIS — Z950 Presence of cardiac pacemaker: Secondary | ICD-10-CM | POA: Diagnosis not present

## 2022-04-06 DIAGNOSIS — Z7901 Long term (current) use of anticoagulants: Secondary | ICD-10-CM | POA: Diagnosis not present

## 2022-04-06 DIAGNOSIS — I872 Venous insufficiency (chronic) (peripheral): Secondary | ICD-10-CM | POA: Insufficient documentation

## 2022-04-06 DIAGNOSIS — Z95820 Peripheral vascular angioplasty status with implants and grafts: Secondary | ICD-10-CM | POA: Diagnosis not present

## 2022-04-06 NOTE — Progress Notes (Addendum)
[name], [name] ([id number]) ?Visit Report for 04/06/2022 ?Chief Complaint Document Details ?Patient Name: [name], [name] ?Date of Service: 04/06/2022 10:15 AM ?Medical Record Number: [id number] ?Patient Account Number: [id number] ?Date of Birth/Sex: July 12, 1923 (88 y.o. F) ?Treating RN: Cornell Barman ?Primary Care Provider: Adrian Prows Other Clinician: ?Referring Provider: Adrian Prows ?Treating Provider/Extender: Jeri Cos ?Weeks in Treatment: 46 ?Information Obtained from: Patient ?Chief Complaint ?Right foot ulcer ?Electronic Signature(s) ?Signed: 04/06/2022 10:34:41 AM By: Worthy Keeler PA-C ?Entered By: Worthy Keeler on 04/06/2022 10:34:41 ?[name], [name] ([id number]) ?-------------------------------------------------------------------------------- ?Debridement Details ?Patient Name: [name], [name] ?Date of Service: 04/06/2022 10:15 AM ?Medical Record Number: [id number] ?Patient Account Number: [id number] ?Date of Birth/Sex: 05-12-1923 (88 y.o. F) ?Treating RN: Cornell Barman ?Primary Care Provider: Adrian Prows Other Clinician: ?Referring Provider: Adrian Prows ?Treating Provider/Extender: Jeri Cos ?Weeks in Treatment: 46 ?Debridement Performed for ?Wound #7 Right,Lateral Malleolus ?Assessment: ?Performed By: Physician Tommie Sams., PA-C ?Debridement Type: Debridement ?Level of Consciousness (Pre- ?Awake and Alert ?procedure): ?Pre-procedure Verification/Time Out ?Yes - 11:07 ?Taken: ?Total Area Debrided (L x W): 1 (cm) x 1 (cm) = 1 (cm?) ?Tissue and other material ?Viable, Slough, Subcutaneous, Skin: Dermis , Skin: Epidermis, Biofilm, Slough ?debrided: ?Level: Skin/Subcutaneous Tissue ?Debridement Description: Excisional ?Instrument: Curette ?Bleeding: Minimum ?Hemostasis Achieved: Pressure ?Response to Treatment: Procedure was tolerated well ?Level of Consciousness (Post- ?Awake and Alert ?procedure): ?Post Debridement Measurements of Total Wound ?Length: (cm) 1 ?Stage:  Category/Stage III ?Width: (cm) 1 ?Depth: (cm) 0.4 ?Volume: (cm?) 0.314 ?Character of Wound/Ulcer Post Debridement: Requires Further Debridement ?Post Procedure Diagnosis ?Same as Pre-procedure ?Electronic Signature(s) ?Signed: 04/06/2022 5:29:17 PM By: Gretta Cool, BSN, RN, CWS, Kim RN, BSN ?Signed: 04/07/2022 4:41:50 PM By: Worthy Keeler PA-C ?Entered By: Gretta Cool, BSN, RN, CWS, Kim on 04/06/2022 11:08:11 ?[name], [name] ([id number]) ?-------------------------------------------------------------------------------- ?HPI Details ?Patient Name: [name], [name] ?Date of Service: 04/06/2022 10:15 AM ?Medical Record Number: [id number] ?Patient Account Number: [id number] ?Date of Birth/Sex: 19-May-1923 (88 y.o. 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 Dr. Amalia Hailey the podiatrist saw her on 07/20/2017 and sent her to the ?wound center for possible hyperbaric oxygen therapy. ?past medical history of peripheral vascular disease, varicose veins, status post appendectomy, basal cell carcinoma excision from the left leg, ?cholecystectomy, pacemaker placement, right lower extremity angiography done by Dr. dew in March 2017 with placement of a stent. there is ?also note of a successful ablation of the right small saphenous vein done which was reviewed by ultrasound on 10/24/2016. the patient had a ?right small saphenous vein ablation done on 10/20/2016. The patient has never been a smoker. ?She has been seen by Dr. Corene Cornea dew the vascular surgeon who most recently saw her on 06/15/2017 for evaluation of ongoing problems with ?right leg swelling. She had a  lower extremity arterial duplex examination done(02/13/17) which showed patent distal right superficial femoral ?artery stent and above-the-knee popliteal stent without evidence of restenosis. The ABI was more than 1.3 on the right and more than 1.3 on the ?left. This was consistent with noncompressible arteries due to medial calcification. The right great toe pressure and PPG waveforms are within ?normal limits and the left great toe pressure and PPG waveforms are decreased. ?he recommended  she continue to wear her compression stockings and continue with elevation. She is scheduled to have a noninvasive arterial ?study in the near future ?08/16/2017 -- had a lower extremity arterial duplex examination done which showed patent distal right superficial femoral artery stent and ?above-the-knee popliteal stent without evidence of restenosis. The ABI was more than 1.3 on the right and more than 1.3 on the left. This was ?consistent with noncompressible arteries due to medial calcification. The right great toe pressure and PPG waveforms are within normal limits and ?the left great toe pressure and PPG waveforms are decreased. ?the x-ray of the right ankle has not yet been done ?08/24/2017 -- had a right ankle x-ray -- IMPRESSION:1. No fracture, bone lesion or evidence of osteomyelitis. 2. Lateral soft tissue swelling with ?a soft tissue ulcer. ?she has not yet seen the vascular surgeon for review ?08/31/17 on evaluation today patient's wound appears to be showing signs of improvement. She still with her appointment with vascular in order ?to review her results of her vascular study and then determine if any intervention would be recommended at that time. No fevers, chills, nausea, ?or vomiting noted at this time. She has been tolerating the dressing changes without complication. ?09/28/17 on evaluation today patient's wound appears to show signs of good improvement in regard to the granulation tissue which is  surfacing. ?There is still a layer of slough covering the wound and the posterior portion is still significantly deeper than the anterior nonetheless there has ?been some good sign of things moving towards the better. She is going to go back to Dr. dew for reevaluation to ensure her blood flow is still ?appropriate. That will be before her next evaluation with [place] next week. No fevers, chills, nausea, or vomiting noted at this time. Patient does have ?some discomfort rated to be a 3-4/10 depending on activity specifically cleansing the wound makes it worse. ?10/05/2017 -- the patient was seen by Dr. Lucky Cowboy last week and noninvasive studies showed a normal right ABI with brisk triphasic waveforms ?consistent with no arterial insufficiency including normal digital pressures. The duplex showed a patent distal right SFA stent and the proximal SFA ?was also normal. He was pleased with her test and thought she should have enough of perfusion for normal wound healing. He would see her back ?in 6 months time. ?12/21/17 on evaluation today patient appears to be doing fairly well in regard to her right lateral ankle wound. Unfortunately the main issue that ?she is expansion at this point is that she is having some issues with what appears to be some cellulitis in the right anterior shin. She has also been ?noting a little bit of uncomfortable feeling especially last night and her ankle area. I'm afraid that she made the developing a little bit of an ?infection. With that being said I think it is in the early stages. ?12/28/17 on evaluation today patient's ankle appears to be doing excellent. She's making good progress at this point the cellulitis seems to have ?improved after last week's evaluation. Overall she is having no significant discomfort which is excellent news. She does have an appointment with ?Dr. dew on March 29, 2018 for reevaluation in regard to the stent he placed. She seems to have excellent blood flow in the right  lower extremity. ?01/19/12 on evaluation today patient's wound appears to be doing very well. In fact she does not appear to require debridement at this point, ?there's no evidence of infection, and overall from the standp

## 2022-04-06 NOTE — Progress Notes (Signed)
PHILIS, DOKE ([id number]) ?Visit Report for 04/06/2022 ?Arrival Information Details ?Patient Name: [name], [name] ?Date of Service: 04/06/2022 10:15 AM ?Medical Record Number: [id number] ?Patient Account Number: [id number] ?Date of Birth/Sex: June 04, 1923 (88 y.o. F) ?Treating RN: Cornell Barman ?Primary Care Onya Eutsler: Adrian Prows Other Clinician: ?Referring Chery Giusto: Adrian Prows ?Treating Catalena Stanhope/Extender: Jeri Cos ?Weeks in Treatment: 46 ?Visit Information History Since Last Visit ?Added or deleted any medications: No ?Patient Arrived: [name] ?Has Dressing in Place as Prescribed: Yes ?Arrival Time: 10:12 ?Has Compression in Place as Prescribed: Yes ?Accompanied By: daughter ?Pain Present Now: No ?Transfer Assistance: None ?Patient Identification Verified: Yes ?Secondary Verification Process Completed: Yes ?Patient Requires Transmission-Based No ?Precautions: ?Patient Has Alerts: Yes ?Patient Alerts: Patient on Blood ?Thinner ?NOT DIABETIC ?***ELIQUIS*** ?Electronic Signature(s) ?Signed: 04/06/2022 5:29:17 PM By: Gretta Cool, BSN, RN, CWS, Kim RN, BSN ?Entered By: Gretta Cool, BSN, RN, CWS, Kim on 04/06/2022 10:15:05 ?DIERDRE, MCCALIP ([id number]) ?-------------------------------------------------------------------------------- ?Encounter Discharge Information Details ?Patient Name: [name] ?Date of Service: 04/06/2022 10:15 AM ?Medical Record Number: [id number] ?Patient Account Number: [id number] ?Date of Birth/Sex: [birth year] (88 y.o. F) ?Treating RN: Cornell Barman ?Primary Care Imogene Gravelle: Adrian Prows Other Clinician: ?Referring Shirlean Berman: Adrian Prows ?Treating Jordi Kamm/Extender: Jeri Cos ?Weeks in Treatment: 46 ?Encounter Discharge Information Items Post Procedure Vitals ?Discharge Condition: Stable ?Unable to obtain vitals Reason: limited time ?Ambulatory Status: Ambulatory ?Discharge Destination: Home ?Transportation: Private Auto ?Accompanied By: daughter ?Schedule Follow-up Appointment:  Yes ?Clinical Summary of Care: ?Electronic Signature(s) ?Signed: 04/06/2022 1:38:36 PM By: Gretta Cool, BSN, RN, CWS, Kim RN, BSN ?Entered By: Gretta Cool, BSN, RN, CWS, Kim on 04/06/2022 13:38:35 ?PEBBLE, BOTKIN ([id number]) ?-------------------------------------------------------------------------------- ?Lower Extremity Assessment Details ?Patient Name: [name] ?Date of Service: 04/06/2022 10:15 AM ?Medical Record Number: [id number] ?Patient Account Number: [id number] ?Date of Birth/Sex: 1923/04/02 (88 y.o. F) ?Treating RN: Cornell Barman ?Primary Care Dovid Bartko: Adrian Prows Other Clinician: ?Referring Ellieanna Funderburg: Adrian Prows ?Treating Vola Beneke/Extender: Jeri Cos ?Weeks in Treatment: 46 ?Edema Assessment ?Assessed: [Left: No] [Right: No] ?[Left: Edema] [Right: :] ?Calf ?Left: Right: ?Point of Measurement: 33 cm From Medial Instep 29 cm 29 cm ?Ankle ?Left: Right: ?Point of Measurement: 10 cm From Medial Instep 19.5 cm 19 cm ?Knee To Floor ?Left: Right: ?From Medial Instep 39 cm ?Vascular Assessment ?Pulses: ?Dorsalis Pedis ?Palpable: [Left:Yes] [Right:Yes] ?Electronic Signature(s) ?Signed: 04/06/2022 5:29:17 PM By: Gretta Cool, BSN, RN, CWS, Kim RN, BSN ?Entered By: Gretta Cool, BSN, RN, CWS, Kim on 04/06/2022 10:29:19 ?RUTHANNA, MACCHIA ([id number]) ?-------------------------------------------------------------------------------- ?Multi Wound Chart Details ?Patient Name: [name] ?Date of Service: 04/06/2022 10:15 AM ?Medical Record Number: [id number] ?Patient Account Number: [id number] ?Date of Birth/Sex: [birth year] (88 y.o. F) ?Treating RN: Cornell Barman ?Primary Care Emani Morad: Adrian Prows Other Clinician: ?Referring Dinorah Masullo: Adrian Prows ?Treating Sona Nations/Extender: Jeri Cos ?Weeks in Treatment: 46 ?Vital Signs ?Height(in): 62 ?Pulse(bpm): 67 ?Weight(lbs): 150 ?Blood Pressure(mmHg): 145/81 ?Body Mass Index(BMI): 27.4 ?Temperature(??F): 97.9 ?Respiratory Rate(breaths/min): 18 ?Photos: [N/A:N/A] ?Wound  Location: Right, Lateral Malleolus Left, Lateral Ankle N/A ?Wounding Event: Gradually Appeared Gradually Appeared N/A ?Primary Etiology: Pressure Ulcer Arterial Insufficiency Ulcer N/A ?Comorbid History: Cataracts, Congestive Heart Failure, Cataracts, Congestive Heart Failure, N/A ?Hypertension, Peripheral Arterial Hypertension, Peripheral Arterial ?Disease, Osteoarthritis, Neuropathy Disease, Osteoarthritis, Neuropathy ?Date Acquired: 05/12/2021 04/03/2022 N/A ?Weeks of Treatment: 46 0 N/A ?Wound Status: Open Open N/A ?Wound Recurrence: No No N/A ?Measurements L x W x D (cm) 1x1x0.1 0.1x0.1x0.1 N/A ?Area (cm?) : 0.785 0.008 N/A ?Volume (cm?) : 0.079 0.001 N/A ?% Reduction in Area: 30.10% N/A N/A ?% Reduction in Volume: 64.90% N/A N/A ?Classification:  Category/Stage III Unclassifiable N/A ?Exudate Amount: Medium Medium N/A ?Exudate Type: Serosanguineous Serous N/A ?Exudate Color: red, brown amber N/A ?Granulation Amount: Medium (34-66%) None Present (0%) N/A ?Granulation Quality: Red N/A N/A ?Necrotic Amount: Medium (34-66%) None Present (0%) N/A ?Exposed Structures: ?Fat Layer (Subcutaneous Tissue): Fascia: No N/A ?Yes Fat Layer (Subcutaneous Tissue): ?Fascia: No No ?Tendon: No ?Tendon: No ?Muscle: No ?Muscle: No ?Joint: No ?Joint: No ?Bone: No ?Bone: No ?Epithelialization: None None N/A ?Treatment Notes ?Electronic Signature(s) ?Signed: 04/06/2022 5:29:17 PM By: Gretta Cool, BSN, RN, CWS, Kim RN, BSN ?Entered By: Gretta Cool, BSN, RN, CWS, Kim on 04/06/2022 11:06:52 ?LASHYA, PASSE ([id number]) ?-------------------------------------------------------------------------------- ?Multi-Disciplinary Care Plan Details ?Patient Name: [name] ?Date of Service: 04/06/2022 10:15 AM ?Medical Record Number: [id number] ?Patient Account Number: [id number] ?Date of Birth/Sex: July 09, 1923 (88 y.o. F) ?Treating RN: Cornell Barman ?Primary Care Micha Erck: Adrian Prows Other Clinician: ?Referring Martiza Speth: Adrian Prows ?Treating  Afnan Cadiente/Extender: Jeri Cos ?Weeks in Treatment: 46 ?Active Inactive ?Pressure ?Nursing Diagnoses: ?Knowledge deficit related to causes and risk factors for pressure ulcer development ?Knowledge deficit related to management of pressures ulcers ?Potential for impaired tissue integrity related to pressure, friction, moisture, and shear ?Goals: ?Patient will remain free from development of additional pressure ulcers ?Date Initiated: 04/06/2022 ?Target Resolution Date: 04/06/2022 ?Goal Status: Active ?Patient/caregiver will verbalize risk factors for pressure ulcer development ?Date Initiated: 04/06/2022 ?Target Resolution Date: 04/06/2022 ?Goal Status: Active ?Patient/caregiver will verbalize understanding of pressure ulcer management ?Date Initiated: 04/06/2022 ?Target Resolution Date: 04/06/2022 ?Goal Status: Active ?Interventions: ?Assess: immobility, friction, shearing, incontinence upon admission and as needed ?Assess offloading mechanisms upon admission and as needed ?Assess potential for pressure ulcer upon admission and as needed ?Notes: ?Electronic Signature(s) ?Signed: 04/06/2022 5:29:17 PM By: Gretta Cool, BSN, RN, CWS, Kim RN, BSN ?Entered By: Gretta Cool, BSN, RN, CWS, Kim on 04/06/2022 11:06:30 ?TYNLEIGH, BIRT ([id number]) ?-------------------------------------------------------------------------------- ?Pain Assessment Details ?Patient Name: JAANA, BRODT ?Date of Service: 04/06/2022 10:15 AM ?Medical Record Number: [id number] ?Patient Account Number: [id number] ?Date of Birth/Sex: 1923-10-10 (88 y.o. F) ?Treating RN: Cornell Barman ?Primary Care Trennon Torbeck: Adrian Prows Other Clinician: ?Referring Niharika Savino: Adrian Prows ?Treating Melicia Esqueda/Extender: Jeri Cos ?Weeks in Treatment: 46 ?Active Problems ?Location of Pain Severity and Description of Pain ?Patient Has Paino No ?Site Locations ?Pain Management and Medication ?Current Pain Management: ?Electronic Signature(s) ?Signed: 04/06/2022 5:29:17 PM By: Gretta Cool, BSN,  RN, CWS, Kim RN, BSN ?Entered By: Gretta Cool, BSN, RN, CWS, Kim on 04/06/2022 10:15:29 ?SHANTIL, VALLEJO ([id number]) ?-------------------------------------------------------------------------------- ?Patient/Car

## 2022-04-13 ENCOUNTER — Encounter: Payer: Medicare Other | Admitting: Physician Assistant

## 2022-04-13 DIAGNOSIS — L89513 Pressure ulcer of right ankle, stage 3: Secondary | ICD-10-CM | POA: Diagnosis not present

## 2022-04-13 NOTE — Progress Notes (Addendum)
METTA, KORANDA ([id number]) ?Visit Report for 04/13/2022 ?Chief Complaint Document Details ?Patient Name: [name], [name] ?Date of Service: 04/13/2022 10:15 AM ?Medical Record Number: [id number] ?Patient Account Number: [id number] ?Date of Birth/Sex: 27-Aug-1923 (88 y.o. F) ?Treating RN: Carlene Coria ?Primary Care Provider: Adrian Prows Other Clinician: ?Referring Provider: Adrian Prows ?Treating Provider/Extender: Jeri Cos ?Weeks in Treatment: 47 ?Information Obtained from: Patient ?Chief Complaint ?Right foot ulcer ?Electronic Signature(s) ?Signed: 04/13/2022 10:04:31 AM By: Worthy Keeler PA-C ?Entered By: Worthy Keeler on 04/13/2022 10:04:31 ?[name], [name] ([id number]) ?-------------------------------------------------------------------------------- ?HPI Details ?Patient Name: [name], [name] ?Date of Service: 04/13/2022 10:15 AM ?Medical Record Number: [id number] ?Patient Account Number: [id number] ?Date of Birth/Sex: Sep 26, 1923 (88 y.o. 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 Dr. Amalia Hailey the podiatrist saw her on 07/20/2017 and sent her to the ?wound center for possible hyperbaric oxygen therapy. ?past medical history of peripheral vascular disease, varicose veins, status post appendectomy, basal cell carcinoma excision from the left leg, ?cholecystectomy, pacemaker placement, right lower extremity angiography done by Dr. dew in March 2017 with placement of a stent. there is ?also note of a successful ablation of the right small saphenous vein done which was reviewed by ultrasound on 10/24/2016. the  patient had a ?right small saphenous vein ablation done on 10/20/2016. The patient has never been a smoker. ?She has been seen by Dr. Corene Cornea dew the vascular surgeon who most recently saw her on 06/15/2017 for evaluation of ongoing problems with ?right leg swelling. She had a lower extremity arterial duplex examination done(02/13/17) which showed patent distal right superficial femoral ?artery stent and above-the-knee popliteal stent without evidence of restenosis. The ABI was more than 1.3 on the right and more than 1.3 on the ?left. This was consistent with noncompressible arteries due to medial calcification. The right great toe pressure and PPG waveforms are within ?normal limits and the left great toe pressure and PPG waveforms are decreased. ?he recommended she continue to wear her compression stockings and continue with elevation. She is scheduled to have a noninvasive arterial ?study in the near future ?08/16/2017 -- had a lower extremity arterial duplex examination done which showed patent distal right superficial femoral artery stent and ?above-the-knee popliteal stent without evidence of restenosis. The ABI was more than 1.3 on the right and more than 1.3 on the left. This was ?consistent with noncompressible arteries due to medial calcification. The right great toe pressure and PPG waveforms are within normal limits and ?the left great toe pressure and PPG waveforms are decreased. ?the x-ray of the right ankle has not yet been done ?08/24/2017 -- had a right ankle x-ray -- IMPRESSION:1. No fracture, bone lesion or evidence of osteomyelitis. 2. Lateral soft tissue swelling with ?a soft tissue ulcer. ?she has not yet seen the vascular surgeon for review ?08/31/17 on evaluation today patient's wound appears to be showing signs of improvement. She still with her appointment with vascular in order ?to review her results of her vascular study and then determine if any intervention would be recommended at that  time. No fevers, chills, nausea, ?or vomiting noted at this time. She has been tolerating the dressing changes without complication. ?09/28/17 on evaluation today patient's  wound appears to show signs of good improvement in regard to the granulation tissue which is surfacing. ?There is still a layer of slough covering the wound and the posterior portion is still significantly deeper than the anterior nonetheless there has ?been some good sign of things moving towards the better. She is going to go back to Dr. dew for reevaluation to ensure her blood flow is still ?appropriate. That will be before her next evaluation with [place] next week. No fevers, chills, nausea, or vomiting noted at this time. Patient does have ?some discomfort rated to be a 3-4/10 depending on activity specifically cleansing the wound makes it worse. ?10/05/2017 -- the patient was seen by Dr. Lucky Cowboy last week and noninvasive studies showed a normal right ABI with brisk triphasic waveforms ?consistent with no arterial insufficiency including normal digital pressures. The duplex showed a patent distal right SFA stent and the proximal SFA ?was also normal. He was pleased with her test and thought she should have enough of perfusion for normal wound healing. He would see her back ?in 6 months time. ?12/21/17 on evaluation today patient appears to be doing fairly well in regard to her right lateral ankle wound. Unfortunately the main issue that ?she is expansion at this point is that she is having some issues with what appears to be some cellulitis in the right anterior shin. She has also been ?noting a little bit of uncomfortable feeling especially last night and her ankle area. I'm afraid that she made the developing a little bit of an ?infection. With that being said I think it is in the early stages. ?12/28/17 on evaluation today patient's ankle appears to be doing excellent. She's making good progress at this point the cellulitis seems to have ?improved  after last week's evaluation. Overall she is having no significant discomfort which is excellent news. She does have an appointment with ?Dr. dew on March 29, 2018 for reevaluation in regard to the stent he placed. She seems to have excellent blood flow in the right lower extremity. ?01/19/12 on evaluation today patient's wound appears to be doing very well. In fact she does not appear to require debridement at this point, ?there's no evidence of infection, and overall from the standpoint of the wound she seems to be doing very well. With that being said I believe that ?it may be time to switch to different dressing away from the [facility] Dressing she tells me she does have a lot going on her friend actually ?passed away yesterday and she's also having a lot of issues with her husband this obviously is weighing heavy on her as far as your thoughts and ?concerns today. ?01/25/18 on evaluation today patient appears to be doing fairly well in regard to her right lateral malleolus. She has been tolerating the dressing ?changes without complication. Overall I feel like this is definitely showing signs of improvement as far as how the overall appearance of the wound ?is there's also evidence of epithelium start to migrate over the granulation tissue. In general I think that she is progressing nicely as far as the ?wound is concerned. The only concern she really has is whether or not we can switch to every other week visits in order to avoid having as many ?appointments as her daughters have a difficult time getting her to her appointments as well as the patient's husband to his he is not doing very ?well at this point. ?02/22/18 on evaluation today patient's right lateral malleolus ulcer  appears to be doing great. She has been tolerating the dressing changes ?without complication. Overall you making excellent progress at this time. Patient is having no significant discomfort. [name], [name] ([id number]) ?03/15/18  on evaluation today patient appears to be doing much more poorly in regard to her right lateral ankle ulcer at this point. Unfortunately ?since have last seen her her husband has passed just a few days ago

## 2022-04-13 NOTE — Progress Notes (Addendum)
[name], [name] ([id number]) ?Visit Report for 04/13/2022 ?Arrival Information Details ?Patient Name: [name], [name] ?Date of Service: 04/13/2022 10:15 AM ?Medical Record Number: [id number] ?Patient Account Number: [id number] ?Date of Birth/Sex: 11/02/1923 (88 y.o. F) ?Treating RN: Carlene Coria ?Primary Care Alberta Cairns: Adrian Prows Other Clinician: ?Referring Manal Kreutzer: Adrian Prows ?Treating Myka Lukins/Extender: Jeri Cos ?Weeks in Treatment: 47 ?Visit Information History Since Last Visit ?All ordered tests and consults were completed: No ?Patient Arrived: Wheel Chair ?Added or deleted any medications: No ?Arrival Time: 10:14 ?Any new allergies or adverse reactions: No ?Accompanied By: daughter ?Had a fall or experienced change in No ?Transfer Assistance: None ?activities of daily living that may affect ?Patient Identification Verified: Yes ?risk of falls: ?Secondary Verification Process Completed: Yes ?Signs or symptoms of abuse/neglect since last visito No ?Patient Requires Transmission-Based No ?Hospitalized since last visit: No ?Precautions: ?Implantable device outside of the clinic excluding No ?Patient Has Alerts: Yes ?cellular tissue based products placed in the center ?Patient Alerts: Patient on Blood ?since last visit: ?Thinner ?Has Dressing in Place as Prescribed: Yes ?NOT DIABETIC ?Pain Present Now: No ?***ELIQUIS*** ?Electronic Signature(s) ?Signed: 04/14/2022 8:03:27 AM By: Worthy Keeler PA-C ?Entered By: Worthy Keeler on 04/13/2022 10:19:56 ?[name], [name] ([id number]) ?-------------------------------------------------------------------------------- ?Clinic Level of Care Assessment Details ?Patient Name: [name], [name] ?Date of Service: 04/13/2022 10:15 AM ?Medical Record Number: [id number] ?Patient Account Number: [id number] ?Date of Birth/Sex: November 15, 1923 (88 y.o. F) ?Treating RN: Carlene Coria ?Primary Care Desree Leap: Adrian Prows Other Clinician: ?Referring Clarice Zulauf:  Adrian Prows ?Treating Bellami Farrelly/Extender: Jeri Cos ?Weeks in Treatment: 47 ?Clinic Level of Care Assessment Items ?TOOL 1 Quantity Score ?'[]'$  - Use when EandM and Procedure is performed on INITIAL visit 0 ?ASSESSMENTS - Nursing Assessment / Reassessment ?'[]'$  - General Physical Exam (combine w/ comprehensive assessment (listed just below) when performed on new ?0 ?pt. evals) ?'[]'$  - 0 ?Comprehensive Assessment (HX, ROS, Risk Assessments, Wounds Hx, etc.) ?ASSESSMENTS - Wound and Skin Assessment / Reassessment ?'[]'$  - Dermatologic / Skin Assessment (not related to wound area) 0 ?ASSESSMENTS - Ostomy and/or Continence Assessment and Care ?'[]'$  - Incontinence Assessment and Management 0 ?'[]'$  - 0 ?Ostomy Care Assessment and Management (repouching, etc.) ?PROCESS - Coordination of Care ?'[]'$  - Simple Patient / Family Education for ongoing care 0 ?'[]'$  - 0 ?Complex (extensive) Patient / Family Education for ongoing care ?'[]'$  - 0 ?Staff obtains Consents, Records, Test Results / Process Orders ?'[]'$  - 0 ?Staff telephones HHA, Nursing Homes / Clarify orders / etc ?'[]'$  - 0 ?Routine Transfer to another Facility (non-emergent condition) ?'[]'$  - 0 ?Routine Hospital Admission (non-emergent condition) ?'[]'$  - 0 ?New Admissions / Biomedical engineer / Ordering NPWT, Apligraf, etc. ?'[]'$  - 0 ?Emergency Hospital Admission (emergent condition) ?PROCESS - Special Needs ?'[]'$  - Pediatric / Minor Patient Management 0 ?'[]'$  - 0 ?Isolation Patient Management ?'[]'$  - 0 ?Hearing / Language / Visual special needs ?'[]'$  - 0 ?Assessment of Community assistance (transportation, D/C planning, etc.) ?'[]'$  - 0 ?Additional assistance / Altered mentation ?'[]'$  - 0 ?Support Surface(s) Assessment (bed, cushion, seat, etc.) ?INTERVENTIONS - Miscellaneous ?'[]'$  - External ear exam 0 ?'[]'$  - 0 ?Patient Transfer (multiple staff / Civil Service fast streamer / Similar devices) ?'[]'$  - 0 ?Simple Staple / Suture removal (25 or less) ?'[]'$  - 0 ?Complex Staple / Suture removal (26 or more) ?'[]'$  -  0 ?Hypo/Hyperglycemic Management (do not check if billed separately) ?'[]'$  - 0 ?Ankle / Brachial Index (ABI) - do not check if billed separately ?Has the patient been seen at  the hospital within the last three years: Yes ?Total Score: 0 ?Level Of Care: ____ ?[name], [name] ([id number]) ?Electronic Signature(s) ?Signed: 04/14/2022 8:03:27 AM By: Worthy Keeler PA-C ?Entered By: Worthy Keeler on 04/13/2022 10:49:14 ?[name], [name] ([id number]) ?-------------------------------------------------------------------------------- ?Compression Therapy Details ?Patient Name: [name], [name] ?Date of Service: 04/13/2022 10:15 AM ?Medical Record Number: [id number] ?Patient Account Number: [id number] ?Date of Birth/Sex: 09-Jun-1923 (88 y.o. F) ?Treating RN: Carlene Coria ?Primary Care Mariano Doshi: Adrian Prows Other Clinician: ?Referring Mervin Ramires: Adrian Prows ?Treating Jeanclaude Wentworth/Extender: Jeri Cos ?Weeks in Treatment: 47 ?Compression Therapy Performed for Wound Assessment: Wound #7 Right,Lateral Malleolus ?Performed By: Clinician Carlene Coria, RN ?Compression Type: Three Layer ?Post Procedure Diagnosis ?Same as Pre-procedure ?Electronic Signature(s) ?Signed: 04/14/2022 8:03:27 AM By: Worthy Keeler PA-C ?Entered By: Worthy Keeler on 04/13/2022 10:48:48 ?[name], [name] ([id number]) ?-------------------------------------------------------------------------------- ?Encounter Discharge Information Details ?Patient Name: [name], [name] ?Date of Service: 04/13/2022 10:15 AM ?Medical Record Number: [id number] ?Patient Account Number: [id number] ?Date of Birth/Sex: 1923/08/13 (88 y.o. F) ?Treating RN: Carlene Coria ?Primary Care Shakiyla Kook: Adrian Prows Other Clinician: ?Referring Orin Eberwein: Adrian Prows ?Treating Kaisyn Millea/Extender: Jeri Cos ?Weeks in Treatment: 47 ?Encounter Discharge Information Items ?Discharge Condition: Stable ?Ambulatory Status: [name] ?Discharge Destination: Home ?Transportation:  Private Auto ?Accompanied By: daughter ?Schedule Follow-up Appointment: Yes ?Clinical Summary of Care: Patient Declined ?Electronic Signature(s) ?Signed: 04/14/2022 8:03:27 AM By: Worthy Keeler PA-C ?Entered By: Worthy Keeler on 04/13/2022 10:50:22 ?[name], [name] ([id number]) ?-------------------------------------------------------------------------------- ?Lower Extremity Assessment Details ?Patient Name: [name], [name] ?Date of Service: 04/13/2022 10:15 AM ?Medical Record Number: [id number] ?Patient Account Number: [id number] ?Date of Birth/Sex: 07-20-1923 (88 y.o. F) ?Treating RN: Carlene Coria ?Primary Care Johney Perotti: Adrian Prows Other Clinician: ?Referring Ardell Makarewicz: Adrian Prows ?Treating Armanda Forand/Extender: Jeri Cos ?Weeks in Treatment: 47 ?Edema Assessment ?Assessed: [Left: No] [Right: No] ?Edema: [Left: N] [Right: o] ?Calf ?Left: Right: ?Point of Measurement: 33 cm From Medial Instep 28 cm ?Ankle ?Left: Right: ?Point of Measurement: 10 cm From Medial Instep 17 cm ?Knee To Floor ?Left: Right: ?From Medial Instep 39 cm ?Vascular Assessment ?Pulses: ?Dorsalis Pedis ?Palpable: [Right:Yes] ?Electronic Signature(s) ?Signed: 04/14/2022 8:03:27 AM By: Worthy Keeler PA-C ?Signed: 04/18/2022 10:35:39 AM By: Carlene Coria RN ?Entered By: Worthy Keeler on 04/13/2022 10:35:51 ?[name], [name] ([id number]) ?-------------------------------------------------------------------------------- ?Multi Wound Chart Details ?Patient Name: [name], [name] ?Date of Service: 04/13/2022 10:15 AM ?Medical Record Number: [id number] ?Patient Account Number: [id number] ?Date of Birth/Sex: [birth year] (88 y.o. F) ?Treating RN: Carlene Coria ?Primary Care Nicklos Gaxiola: Adrian Prows Other Clinician: ?Referring Dariah Mcsorley: Adrian Prows ?Treating Vimal Derego/Extender: Jeri Cos ?Weeks in Treatment: 47 ?Vital Signs ?Height(in): 62 ?Pulse(bpm): 66 ?Weight(lbs): 150 ?Blood Pressure(mmHg): 140/71 ?Body Mass Index(BMI):  27.4 ?Temperature(??F): 98.2 ?Respiratory Rate(breaths/min): 18 ?Photos: [N/A:N/A] ?Wound Location: Right, Lateral Malleolus Left, Lateral Ankle N/A ?Wounding Event: Gradually Appeared Gradually Appeared N/A ?Primary Etiology:

## 2022-04-15 ENCOUNTER — Emergency Department
Admission: EM | Admit: 2022-04-15 | Discharge: 2022-04-15 | Disposition: A | Discharge status: Home / Self Care | Payer: Medicare Other | Attending: Emergency Medicine | Admitting: Emergency Medicine

## 2022-04-15 ENCOUNTER — Emergency Department: Payer: Medicare Other

## 2022-04-15 ENCOUNTER — Other Ambulatory Visit: Payer: Self-pay

## 2022-04-15 DIAGNOSIS — M25511 Pain in right shoulder: Secondary | ICD-10-CM | POA: Diagnosis not present

## 2022-04-15 DIAGNOSIS — W1839XA Other fall on same level, initial encounter: Secondary | ICD-10-CM | POA: Insufficient documentation

## 2022-04-15 DIAGNOSIS — S2231XA Fracture of one rib, right side, initial encounter for closed fracture: Secondary | ICD-10-CM | POA: Diagnosis not present

## 2022-04-15 DIAGNOSIS — W19XXXA Unspecified fall, initial encounter: Secondary | ICD-10-CM

## 2022-04-15 DIAGNOSIS — S61412A Laceration without foreign body of left hand, initial encounter: Secondary | ICD-10-CM | POA: Diagnosis not present

## 2022-04-15 DIAGNOSIS — S299XXA Unspecified injury of thorax, initial encounter: Secondary | ICD-10-CM | POA: Diagnosis present

## 2022-04-15 DIAGNOSIS — S8001XA Contusion of right knee, initial encounter: Secondary | ICD-10-CM | POA: Insufficient documentation

## 2022-04-15 MED ORDER — PREDNISONE 50 MG PO TABS: 50.0000 mg | ORAL_TABLET | Freq: Every day | ORAL | 0 refills | Status: DC

## 2022-04-15 MED ORDER — HYDROCODONE-ACETAMINOPHEN 5-325 MG PO TABS: 1.0000 | ORAL_TABLET | ORAL | 0 refills | Status: DC | PRN

## 2022-04-15 MED ORDER — PREDNISONE 20 MG PO TABS
60.0000 mg | ORAL_TABLET | Freq: Once | ORAL | Status: AC
  Administered 2022-04-15: 60 mg via ORAL
  Filled 2022-04-15: qty 3

## 2022-04-15 MED ORDER — HYDROCODONE-ACETAMINOPHEN 5-325 MG PO TABS
1.0000 | ORAL_TABLET | Freq: Once | ORAL | Status: AC
  Administered 2022-04-15: 1 via ORAL
  Filled 2022-04-15: qty 1

## 2022-04-15 NOTE — ED Triage Notes (Signed)
Pt from [facility] independent living, pt had mechanical fall from shoes sliding. Pt landed on right shoulder and right ribs. Pt denies LOC or hitting head. Pt alert and oriented, Pt states she also cut right hand, pt with bandaid on right hand.  ?

## 2022-04-15 NOTE — ED Provider Notes (Signed)
? ?[facility] ?Provider Note ? ?Patient Contact: 8:28 PM (approximate) ? ? ?History  ? ?Fall ? ? ?HPI ? ?[name] is a 88 y.o. female who presents the emergency department after mechanical fall.  Patient states that she has to wear special low-cut shoes due to a wound on her right ankle.  This is currently being followed by wound care.  She states that she got up, felt like her heel was not in her shoe which caused her foot to roll causing her to fall.  Patient landed on the right knee and then landed on her right side.  She is complaining of knee pain, ecchymosis about the right knee as well as pain to the right ribs and right shoulder.  She did not hit her head or lose consciousness.  She denies any headache, vision changes, neck pain, substernal chest pain, shortness of breath, abdominal pain.  No hip pain or low back pain.  Patient does not take any medications prior to arrival. ?  ? ? ?Physical Exam  ? ?Triage Vital Signs: ?ED Triage Vitals  ?Enc Vitals Group  ?   BP 04/15/22 1841 137/70  ?   Pulse Rate 04/15/22 1841 70  ?   Resp 04/15/22 1841 18  ?   Temp 04/15/22 1841 98.5 ?F (36.9 ?C)  ?   Temp Source 04/15/22 1841 Oral  ?   SpO2 04/15/22 1841 95 %  ?   Weight 04/15/22 1842 152 lb (68.9 kg)  ?   Height 04/15/22 1842 '5\' 6"'$  (1.676 m)  ?   Head Circumference --   ?   Peak Flow --   ?   Pain Score 04/15/22 1842 5  ?   Pain Loc --   ?   Pain Edu? --   ?   Excl. in [place]? --   ? ? ?Most recent vital signs: ?Vitals:  ? 04/15/22 1841  ?BP: 137/70  ?Pulse: 70  ?Resp: 18  ?Temp: 98.5 ?F (36.9 ?C)  ?SpO2: 95%  ? ? ? ?General: Alert and in no acute distress. ?Eyes:  PERRL. EOMI. ?Head: No acute traumatic findings  ?Neck: No stridor. No cervical spine tenderness to palpation  ?Cardiovascular:  Good peripheral perfusion ?Respiratory: Normal respiratory effort without tachypnea or retractions. Lungs CTAB. Good air entry to the bases with no decreased or absent breath sounds. ?Gastrointestinal:  Bowel sounds ?4 quadrants. Soft and nontender to palpation. No guarding or rigidity. No palpable masses. No distention. ?Musculoskeletal: Full range of motion to all extremities.  Visualization of the right ribs reveals no obvious signs of trauma with open wounds, large areas of ecchymosis, paradoxical chest wall movement.  Patient is tender over ribs 4 through 7 along the right lateral rib cage.  No palpable abnormality or crepitus.  No subcutaneous emphysema.  Good underlying breath sounds bilaterally.  There is no frank tenderness over the right shoulder to include the shoulder joint, superior aspect of the shoulder or scapular region.  No tenderness into the cervical spine.  Examination of the right knee reveals ecchymosis along the anterior aspect without open wounds.  No deformity.  Patient is bearing weight and moving knee appropriately at this time.  She reports some tenderness over the patella itself but no other significant tenderness.  Gentle application of special test was performed with no gross laxity. ?Neurologic:  No gross focal neurologic deficits are appreciated.  Cranial nerves II through XII grossly intact. ?Skin:   No rash noted.  Visualization of the left wrist reveals a small skin tear.  No active bleeding.  No visible foreign body.  This measures approximately 1.5 cm in length. ?Other: ? ? ?ED Results / Procedures / Treatments  ? ?Labs ?(all labs ordered are listed, but only abnormal results are displayed) ?Labs Reviewed - No data to display ? ? ?EKG ? ? ? ? ?RADIOLOGY ? ?I personally viewed, evaluated, and interpreted these images as part of my medical decision making, as well as reviewing the written report by the radiologist. ? ?ED Provider Interpretation: Patient has a solitary minimally displaced sixth rib fracture.  There is no underlying signs of trauma to include pneumothorax or hemothorax.  No evidence of fractures about the right shoulder or right knee. ? ?DG Ribs Unilateral W/Chest  Right ? ?Result Date: 04/15/2022 ?CLINICAL DATA:  Right rib pain following fall, initial encounter EXAM: RIGHT RIBS AND CHEST - 3+ VIEW COMPARISON:  None Available. FINDINGS: There is a minimally displaced right sixth rib fracture laterally. No other fractures are seen. No pneumothorax is noted. Ingested tablets are noted in the upper abdomen. IMPRESSION: Minimally displaced right sixth rib fracture without complicating factors. Electronically Signed   By: Inez Catalina M.D.   On: 04/15/2022 20:08  ? ?DG Shoulder Right ? ?Result Date: 04/15/2022 ?CLINICAL DATA:  Recent fall with right shoulder pain, initial encounter EXAM: RIGHT SHOULDER - 2+ VIEW COMPARISON:  None Available. FINDINGS: Degenerative changes of the acromioclavicular joint are seen. No acute fracture or dislocation is noted. Under lying bony thorax appears within normal limits. No soft tissue abnormality is seen. IMPRESSION: No acute abnormality noted. Electronically Signed   By: Inez Catalina M.D.   On: 04/15/2022 20:06  ? ?DG Knee Complete 4 Views Right ? ?Result Date: 04/15/2022 ?CLINICAL DATA:  Knee pain EXAM: RIGHT KNEE - COMPLETE 4+ VIEW COMPARISON:  None Available. FINDINGS: No acute fracture or dislocation identified. Bones are osteopenic. Joint spaces are preserved. No significant joint effusion visualized. Vascular stent graft noted in the distal thigh. IMPRESSION: No acute osseous abnormality identified. Electronically Signed   By: Ofilia Neas M.D.   On: 04/15/2022 21:21   ? ?PROCEDURES: ? ?Critical Care performed: No ? ?Marland Kitchen.Laceration Repair ? ?Date/Time: 04/15/2022 11:28 PM ?Performed by: Darletta Moll, PA-C ?Authorized by: Darletta Moll, PA-C  ? ?Consent:  ?  Consent obtained:  Verbal ?  Consent given by:  Patient ?  Risks discussed:  Infection, pain and poor wound healing ?Universal protocol:  ?  Procedure explained and questions answered to patient or proxy's satisfaction: yes   ?  Immediately prior to procedure, a time  out was called: yes   ?  Patient identity confirmed:  Verbally with patient ?Anesthesia:  ?  Anesthesia method:  None ?Laceration details:  ?  Location:  Hand ?  Hand location:  L hand, dorsum ?  Length (cm):  1.5 ?Exploration:  ?  Limited defect created (wound extended): no   ?  Hemostasis achieved with:  Direct pressure ?  Imaging outcome: foreign body not noted   ?  Wound exploration: entire depth of wound visualized   ?  Wound extent: no foreign bodies/material noted and no underlying fracture noted   ?  Contaminated: no   ?Treatment:  ?  Area cleansed with:  Shur-Clens ?  Amount of cleaning:  Standard ?Skin repair:  ?  Repair method: Dermaclips. ?Approximation:  ?  Approximation:  Close ?Repair type:  ?  Repair type:  Simple ?Post-procedure  details:  ?  Procedure completion:  Tolerated well, no immediate complications ? ? ?MEDICATIONS ORDERED IN ED: ?Medications  ?HYDROcodone-acetaminophen (NORCO/VICODIN) 5-325 MG per tablet 1 tablet (1 tablet Oral Given 04/15/22 2215)  ?predniSONE (DELTASONE) tablet 60 mg (60 mg Oral Given 04/15/22 2215)  ? ? ? ?IMPRESSION / MDM / ASSESSMENT AND PLAN / ED COURSE  ?I reviewed the triage vital signs and the nursing notes. ?             ?               ? ?Differential diagnosis includes, but is not limited to, fall, shoulder fracture, rib fracture, pneumothorax, hemothorax, hip fracture, knee fracture ? ? ?Patient's diagnosis is consistent with fall, rib fracture, skin tear to the left hand.  Patient presented to the emergency department after mechanical fall.  She did land on her right knee, right ribs and sustained a skin tear to the left hand.  Skin tear was closed with derma clips as skin appeared too fragile to close with sutures.  Wound care instructions discussed with the patient and her daughter.  Patient did sustain a rib fracture to the right side but there is no gross displacement and no underlying signs of trauma.  Discussed ongoing pain relief with the patient and her  daughter as well.  Concerning signs and symptoms are discussed.  Follow-up with primary care..  Patient is given ED precautions to return to the ED for any worsening or new symptoms. ? ? ? ?  ? ? ?FINA

## 2022-04-15 NOTE — ED Notes (Signed)
Pt and daughter give verbal consent for dc. Pt refuses Dc vitals  ?

## 2022-04-16 ENCOUNTER — Telehealth: Payer: Self-pay | Admitting: Emergency Medicine

## 2022-04-16 MED ORDER — HYDROCODONE-ACETAMINOPHEN 5-325 MG PO TABS: 1.0000 | ORAL_TABLET | ORAL | 0 refills | Status: AC | PRN

## 2022-04-16 MED ORDER — PREDNISONE 50 MG PO TABS: 50.0000 mg | ORAL_TABLET | Freq: Every day | ORAL | 0 refills | Status: AC

## 2022-04-16 NOTE — Telephone Encounter (Cosign Needed)
Patient's daughter called to the emergency department and asked that medications be sent to the [facility] from her encounter last night.  Sending and patient's prescriptions at this time. ?

## 2022-04-20 ENCOUNTER — Encounter: Payer: Medicare Other | Admitting: Physician Assistant

## 2022-04-20 DIAGNOSIS — L89513 Pressure ulcer of right ankle, stage 3: Secondary | ICD-10-CM | POA: Diagnosis not present

## 2022-04-20 NOTE — Progress Notes (Addendum)
BRYONNA, SUNDBY ([id number]) Visit Report for 04/20/2022 Chief Complaint Document Details Patient Name: [name], [name]. Date of Service: 04/20/2022 10:15 AM Medical Record Number: [id number] Patient Account Number: [id number] Date of Birth/Sex: 09/16/1923 (88 y.o. F) Treating RN: Carlene Coria Primary Care Provider: Adrian Prows Other Clinician: Referring Provider: Adrian Prows Treating Provider/Extender: Skipper Cliche in Treatment: 31 Information Obtained from: Patient Chief Complaint Right foot ulcer Electronic Signature(s) Signed: 04/20/2022 9:57:24 AM By: Worthy Keeler PA-C Entered By: Worthy Keeler on 04/20/2022 09:57:24 Haley, [name] ([id number]) -------------------------------------------------------------------------------- HPI Details Patient Name: [name] Date of Service: 04/20/2022 10:15 AM Medical Record Number: [id number] Patient Account Number: [id number] Date of Birth/Sex: Jun 15, 1923 (88 y.o. 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 Dr. Amalia Hailey the podiatrist saw her on 07/20/2017 and sent her to the wound center for possible hyperbaric oxygen therapy. past medical history of peripheral vascular disease, varicose veins, status post appendectomy, basal cell carcinoma excision from the left leg, cholecystectomy, pacemaker placement, right lower extremity angiography done by Dr. dew in March 2017 with placement of a stent. there is also note of a successful ablation of the right small saphenous vein done which was reviewed by ultrasound on 10/24/2016. the  patient had a right small saphenous vein ablation done on 10/20/2016. The patient has never been a smoker. She has been seen by Dr. Corene Cornea dew the vascular surgeon who most recently saw her on 06/15/2017 for evaluation of ongoing problems with right leg swelling. She had a lower extremity arterial duplex examination done(02/13/17) which showed patent distal right superficial femoral artery stent and above-the-knee popliteal stent without evidence of restenosis. The ABI was more than 1.3 on the right and more than 1.3 on the left. This was consistent with noncompressible arteries due to medial calcification. The right great toe pressure and PPG waveforms are within normal limits and the left great toe pressure and PPG waveforms are decreased. he recommended she continue to wear her compression stockings and continue with elevation. She is scheduled to have a noninvasive arterial study in the near future 08/16/2017 -- had a lower extremity arterial duplex examination done which showed patent distal right superficial femoral artery stent and above-the-knee popliteal stent without evidence of restenosis. The ABI was more than 1.3 on the right and more than 1.3 on the left. This was consistent with noncompressible arteries due to medial calcification. The right great toe pressure and PPG waveforms are within normal limits and the left great toe pressure and PPG waveforms are decreased. the x-ray of the right ankle has not yet been done 08/24/2017 -- had a right ankle x-ray -- IMPRESSION:1. No fracture, bone lesion or evidence of osteomyelitis. 2. Lateral soft tissue swelling with a soft tissue ulcer. she has not yet seen the vascular surgeon for review 08/31/17 on evaluation today patient's wound appears to be showing signs of improvement. She still with her appointment with vascular in order to review her results of her vascular study and then determine if any intervention would be recommended at that  time. No fevers, chills, nausea, or vomiting noted at this time. She has been tolerating the dressing changes without complication. 09/28/17 on evaluation today patient's  wound appears to show signs of good improvement in regard to the granulation tissue which is surfacing. There is still a layer of slough covering the wound and the posterior portion is still significantly deeper than the anterior nonetheless there has been some good sign of things moving towards the better. She is going to go back to Dr. dew for reevaluation to ensure her blood flow is still appropriate. That will be before her next evaluation with [place] next week. No fevers, chills, nausea, or vomiting noted at this time. Patient does have some discomfort rated to be a 3-4/10 depending on activity specifically cleansing the wound makes it worse. 10/05/2017 -- the patient was seen by Dr. Lucky Cowboy last week and noninvasive studies showed a normal right ABI with brisk triphasic waveforms consistent with no arterial insufficiency including normal digital pressures. The duplex showed a patent distal right SFA stent and the proximal SFA was also normal. He was pleased with her test and thought she should have enough of perfusion for normal wound healing. He would see her back in 6 months time. 12/21/17 on evaluation today patient appears to be doing fairly well in regard to her right lateral ankle wound. Unfortunately the main issue that she is expansion at this point is that she is having some issues with what appears to be some cellulitis in the right anterior shin. She has also been noting a little bit of uncomfortable feeling especially last night and her ankle area. I'm afraid that she made the developing a little bit of an infection. With that being said I think it is in the early stages. 12/28/17 on evaluation today patient's ankle appears to be doing excellent. She's making good progress at this point the cellulitis seems to have improved  after last week's evaluation. Overall she is having no significant discomfort which is excellent news. She does have an appointment with Dr. dew on March 29, 2018 for reevaluation in regard to the stent he placed. She seems to have excellent blood flow in the right lower extremity. 01/19/12 on evaluation today patient's wound appears to be doing very well. In fact she does not appear to require debridement at this point, there's no evidence of infection, and overall from the standpoint of the wound she seems to be doing very well. With that being said I believe that it may be time to switch to different dressing away from the [facility] Dressing she tells me she does have a lot going on her friend actually passed away yesterday and she's also having a lot of issues with her husband this obviously is weighing heavy on her as far as your thoughts and concerns today. 01/25/18 on evaluation today patient appears to be doing fairly well in regard to her right lateral malleolus. She has been tolerating the dressing changes without complication. Overall I feel like this is definitely showing signs of improvement as far as how the overall appearance of the wound is there's also evidence of epithelium start to migrate over the granulation tissue. In general I think that she is progressing nicely as far as the wound is concerned. The only concern she really has is whether or not we can switch to every other week visits in order to avoid having as many appointments as her daughters have a difficult time getting her to her appointments as well as the patient's husband to his he is not doing very well at this point. 02/22/18 on evaluation today patient's right lateral malleolus ulcer  appears to be doing great. She has been tolerating the dressing changes without complication. Overall you making excellent progress at this time. Patient is having no significant discomfort. MATTYE, VERDONE ([id number]) 03/15/18  on evaluation today patient appears to be doing much more poorly in regard to her right lateral ankle ulcer at this point. Unfortunately since have last seen her her husband has passed just a few days ago is obviously weighed heavily on her her daughter also had surgery well she is with her today as usual. There does not appear to be any evidence of infection she does seem to have significant contusion/deep tissue injury to the right lateral malleolus which was not noted previous when I saw her last. It's hard to tell of exactly when this injury occurred although during the time she was spending the night in the hospital this may have been most likely. 03/22/18 on evaluation today patient appears to actually be doing very well in regard to her ulcer. She did unfortunately have a setback which was noted last week however the good news is we seem to be getting back on track and in fact the wound in the core did still have some necrotic tissue which will be addressed at this point today but in general I'm seeing signs that things are on the up and up. She is glad to hear this obviously she's been somewhat concerned that due to the how her wound digressed more recently. 03/29/18 on evaluation today patient appears to be doing fairly well in regard to her right lower extremity lateral malleolus ulcer. She unfortunately does have a new area of pressure injury over the inferior portion where the wound has opened up a little bit larger secondary to the pressure she seems to be getting. She does tell me sometimes when she sleeps at night that it actually hurts and does seem to be pushing on the area little bit more unfortunately. There does not appear to be any evidence of infection which is good news. She has been tolerating the dressing changes without complication. She also did have some bruising in the left second and third toes due to the fact that she may have bump this or injured it although she has  neuropathy so she does not feel she did move recently that may have been where this came from. Nonetheless there does not appear to be any evidence of infection at this time. 04/12/18 on evaluation today patient's wound on the right lateral ankle actually appears to be doing a little bit better with a lot of necrotic docking tissue centrally loosening up in clearing away. However she does have the beginnings of a deep tissue injury on the left lateral malleolus likely due to the fact we've been trying offload the right as much as we have. I think she may benefit from an assistive soft device to help with offloading and it looks like they're looking at one of the doughnut conditions that wraps around the lower leg to offload which I think will definitely do a good job. With that being said I think we definitely need to address this issue on the left before it becomes a wound. Patient is not having significant pain. 04/19/18 on evaluation today patient appears to be doing excellent in regard to the progress she's made with her right lateral ankle ulcer. The left ankle region which did show evidence of a deep tissue injury seems to be resolving there's little fluid noted underneath and a blister there's  nothing open at this point in time overall I feel like this is progressing nicely which is good news. She does not seem to be having significant discomfort at this point which is also good news. 04/25/18-She is here in follow up evaluation for bilateral lateral malleolar ulcers. The right lateral malleolus ulcer with pale subcutaneous tissue exposure, central area of ulcer with tendon/periosteum exposed. The left lateral malleolus ulcer now with central area of nonviable tissue, otherwise deep tissue injury. She is wearing compression wraps to the left lower extremity, she will place the right lower extremity compression wraps on when she gets home. She will be out of town over the weekend and return next  week and follow-up appointment. She completed her doxycycline this morning 05/03/18 on evaluation today patient appears to be doing very well in regard to her right lateral ankle ulcer in general. At least she's showing some signs of improvement in this regard. Unfortunately she has some additional injury to the left lateral malleolus region which appears to be new likely even over the past several days. Again this determination is based on the overall appearance. With that being said the patient is obviously frustrated about this currently. 05/10/18-She is here in follow-up evaluation for bilateral lateral malleolar ulcers. She states she has purchased offloading shoes/boots and they will arrive tomorrow. She was asked to bring them in the office at next week's appointment so her provider is aware of product being utilized. She continues to sleep on right or left side, she has been encouraged to sleep on her back. The right lateral malleolus ulcer is precariously close to peri-osteum; will order xray. The left lateral malleolus ulcer is improved. Will switch back to santyl; she will follow up next week. 05/17/18 on evaluation today patient actually appears to be doing very well in regard to her malleolus her ulcers compared to last time I saw them. She does not seem to have as much in the way of contusion at this point which is great news. With that being said she does continue to have discomfort and I do believe that she is still continuing to benefit from the offloading/pressure reducing boots that were recommended. I think this is the key to trying to get this to heal up completely. 05/24/18 on evaluation today patient actually appears to be doing worse at this point in time unfortunately compared to her last week's evaluation. She is having really no increased pain which is good news unfortunately she does have more maceration in your theme and noted surrounding the right lateral ankle the left  lateral ankle is not really is erythematous I do not see signs of the overt cellulitis on that side. Unfortunately the wounds do not seem to have shown any signs of improvement since the last evaluation. She also has significant swelling especially on the right compared to previous some of this may be due to infection however also think that she may be served better while she has these wounds by compression wrapping versus continuing to use the Juxta-Lite for the time being. Especially with the amount of drainage that she is experiencing at this point. No fevers, chills, nausea, or vomiting noted at this time. 05/31/18 on evaluation today patient appears to actually be doing better in regard to her right lateral lower extremity ulcer specifically on the malleolus region. She has been tolerating the antibiotic without complication. With that being said she still continues to have issues but a little bit of redness although nothing like  she what she was experiencing previous. She still continues to pressure to her ankle area she did get the problem on offloading boots unfortunately she will not wear them she states there too uncomfortable and she can't get in and out of the bed. Nonetheless at this point her wounds seem to be continually getting worse which is not what we want I'm getting somewhat concerned about her progress and how things are going to proceed if we do not intervene in some way shape or form. I therefore had a very lengthy conversation today about offloading yet again and even made a specific suggestion for switching her to a memory foam mattress and even gave the information for a specific one that they could look at getting if it was something that they were interested in considering. She does not want to be considered for a hospital bed air mattress although honestly insurance would not cover it that she does not have any wounds on her trunk. 06/14/18 on evaluation today both wounds  over the bilateral lateral malleolus her ulcers appear to be doing better there's no evidence of pressure injury at this point. She did get the foam mattress for her bed and this does seem to have been extremely beneficial for her in my pinion. Her daughter states that she is having difficulty getting out of bed because of how soft it is. The patient also relates this to be. Nonetheless I do feel like she's actually doing better. Unfortunately right after and around the time she was getting the mattress she also sustained a fall when she got up to go pick up the phone and ended up injuring her right elbow she has [address] sut[address] place. We are not caring for this currently although home health is going to be taking the sutures out shortly. Nonetheless this may be something that we need to evaluate going forward. It depends on how well it has or has not healed in the end. She also recently saw an orthopedic specialist for an injection in the right shoulder just before her fall unfortunately the fall seems to have worsened her pain. 06/21/18 on evaluation today patient appears to be doing about the same in regard to her lateral malleolus ulcers. Both appear to be just a little bit deeper but again we are clinging away the necrotic and dead tissue which I think is why this is progressing towards a deeper realm as opposed MONCHEL, POLLITT. ([id number]) to improving from my measurement standpoint in that regard. Nonetheless she has been tolerating the dressing changes she absolutely hates the memory foam mattress topper that was obtained for her nonetheless I do believe this is still doing excellent as far as taking care of excess pressure in regard to the lateral malleolus regions. She in fact has no pressure injury that I see whereas in weeks past it was week by week I was constantly seeing new pressure injuries. Overall I think it has been very beneficial for her. 07/03/18; patient arrives in my clinic  today. She has deep punched out areas over her bilateral lateral malleoli. The area on the right has some more depth. We spent a lot of time today talking about pressure relief for these areas. This started when her daughter asked for a prescription for a memory foam mattress. I have never written a prescription for a mattress and I don't think insurances would pay for that on an ordinary bed. In any case he came up that she has foam  boots that she refuses to wear. I would suggest going to these before any other offloading issues when she is in bed. They say she is meticulous about offloading this the rest of the day 07/10/18- She is seen in follow-up evaluation for bilateral, lateral malleolus ulcers. There is no improvement in the ulcers. She has purchased and is sleeping on a memory foam mattress/overlay, she has been using the offloading boots nightly over the past week. She has a follow up appointment with vascular medicine at the end of October, in my opinion this follow up should be expedited given her deterioration and suboptimal TBI results. We will order plain film xray of the left ankle as deeper structures are palpable; would consider having MRI, regardless of xray report(s). The ulcers will be treated with iodoflex/iodosorb, she is unable to safely change the dressings daily with santyl. 07/19/18 on evaluation today patient appears to be doing in general visually well in regard to her bilateral lateral malleolus ulcers. She has been tolerating the dressing changes without complication which is good news. With that being said we did have an x-ray performed on 07/12/18 which revealed a slight loosen see in the lateral portion of the distal left fibula which may represent artifact but underline lytic destruction or osteomyelitis could not be excluded. MRI was recommended. With that being said we can see about getting the patient scheduled for an MRI to further evaluate this area. In fact we have  that scheduled currently for August 20 19,019. 07/26/18 on evaluation today patient's wound on the right lateral ankle actually appears to be doing fairly well at this point in my pinion. She has made some good progress currently. With that being said unfortunately in regard to the left lateral ankle ulcer this seems to be a little bit more problematic at this time. In fact as I further evaluated the situation she actually had bone exposed which is the first time that's been the case in the bone appear to be necrotic. Currently I did review patient's note from Dr. Bunnie Domino office with [facility]. He stated that ABI was 1.26 on the right and 0.95 on the left with good waveforms. Her perfusion is stable not reduced from previous studies and her digital waveforms were pretty good particularly on the right. His conclusion upon review of the note was that there was not much she could do to improve her perfusion and he felt she was adequate for wound healing. His suggestion was that she continued to see [place] and consider a synthetic skin graft if there was no underlying infection. He plans to see her back in six months or as needed. 08/01/18 on evaluation today patient appears to be doing better in regard to her right lateral ankle ulcer. Her left lateral ankle ulcer is about the same she still has bone involvement in evidence of necrosis. There does not appear to be evidence of infection at this time On the right lateral lower extremity. I have started her on the Augmentin she picked this up and started this yesterday. This is to get her through until she sees infectious disease which is scheduled for 08/12/18. 08/06/18 on evaluation today patient appears to be doing rather well considering my discussion with patient's daughter at the end of last week. The area which was marked where she had erythema seems to be improved and this is good news. With that being said overall the patient seems  to be making good improvement when it  comes to the overall appearance of the right lateral ankle ulcer although this has been slow she at least is coming around in this regard. Unfortunately in regard to the left lateral ankle ulcer this is osteomyelitis based on the pathology report as well is bone culture. Nonetheless we are still waiting CT scan. Unfortunately the MRI we originally ordered cannot be performed as the patient is a pacemaker which I had overlooked. Nonetheless we are working on the CT scan approval and scheduling as of now. She did go to the hospital over the weekend and was placed on IV Cefzo for a couple of days. Fortunately this seems to have improved the erythema quite significantly which is good news. There does not appear to be any evidence of worsening infection at this time. She did have some bleeding after the last debridement therefore I did not perform any sharp debridement in regard to left lateral ankle at this point. Patient has been approved for a snap vac for the right lateral ankle. 08/14/18; the patient with wounds over her bilateral lateral malleoli. The area on the right actually looks quite good. Been using a snap back on this area. Healthy granulation and appears to be filling in. Unfortunately the area on the left is really problematic. 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 Also noted to have cellulitis. She saw Dr. Novella Olive of infectious disease today and was put on linezolid. We are able to verify this with her pharmacy. She is completed the Augmentin that she was already on. We've been using Iodoflex to this area 08/23/18 on evaluation today patient's wounds both actually appear to be doing better compared to my prior evaluations. Fortunately she showing signs of good improvement in regard to the overall wound status especially where were using the snap vac on the right. In  regard to left lateral malleolus the wound bed actually appears to be much cleaner than previously noted. I do not feel any phone directly probed during evaluation today and though there is tendon noted this does not appear to be necrotic it's actually fairly good as far as the overall appearance of the tendon is concerned. In general the wound bed actually appears to be doing significantly better than it was previous. Patient is currently in the care of [name] and I did review that note today. He actually has her on two weeks of linezolid and then following the patient will be on 1-2 months of Keflex. That is the plan currently. She has been on antibiotics therapy as prescribed by myself initially starting on July 30, 2018 and has been on that continuously up to this point. 08/30/18 on evaluation today patient actually appears to be doing much better in regard to her right lateral malleolus ulcer. She has been tolerating the dressing changes specifically the snap vac without complication although she did have some issues with the seal currently. Apparently there was some trouble with getting it to maintain over the past week past Sunday. Nonetheless overall the wound appears better in regard to the right lateral malleolus region. In regard to left lateral malleolus this actually show some signs of additional granulation although there still tendon noted in the base of the wound this appears to be healthy not necrotic in any way whatsoever. We are considering potentially using a snap vac for the left lateral malleolus as well the product wrap from [employer], [place], was present in  the clinic today we're going to see this patient I did have her come in with me after obtaining consent from the patient and her daughter in order to look at the wound and see if there's any recommendation one way or another as to whether or not they felt the snapback could be beneficial for the left lateral malleolus region.  But the conclusion was that it might be but that this is definitely a little bit deeper wound than what traditionally would be utilized for a snap vac. 09/06/18 on evaluation today patient actually appears to be doing excellent in my pinion in regard to both ankle ulcers. She has been tolerating the dressing changes without complication which is great news. Specifically we have been using the snap vac. In regard to the right ankle I'm not even sure that this is going to be necessary for today and following as the wound has filled in quite nicely. In regard to the left ankle I do believe SEASON, ASTACIO ([id number]) that we're seeing excellent epithelialization from the edge as well as granulation in the central portion the tendon is still exposed but there's no evidence of necrotic bone and in general I feel like the patient has made excellent progress even compared to last week with just one week of the snap vac. 09/11/18; this is a patient who has wounds on her bilateral lateral malleoli. Initially both of these were deep stage IV wounds in the setting of chronic arterial insufficiency. She has been revascularized. As I understand think she been using snap vacs to both of these wounds however the area on the right became more superficial and currently she is only using it on the left. Using silver collagen on the right and silver collagen under the back on the left I believe 09/19/18 on evaluation today patient actually appears to be doing very well in regard to her lateral malleolus or ulcers bilaterally. She has been tolerating the dressing changes without complication. Fortunately there does not appear to be any evidence of infection at this time. Overall I feel like she is improving in an excellent manner and I'm very pleased with the fact that everything seems to be turning towards the better for her. This has obviously been a long road. 09/27/18 on evaluation today patient actually appears  to be doing very well in regard to her bilateral lateral malleolus ulcers. She has been tolerating the dressing changes without complication. Fortunately there does not appear to be any evidence of infection at this time which is also great news. No fevers, chills, nausea, or vomiting noted at this time. Overall I feel like she is doing excellent with the snap vac on the left malleolus. She had 40 mL of fluid collection over the past week. 10/04/18 on evaluation today patient actually appears to be doing well in regard to her bilateral lateral malleolus ulcers. She continues to tolerate the dressing changes without complication. One issue that I see is the snap vac on the left lateral malleolus which appears to have sealed off some fluid underlying this area and has not really allowed it to heal to the degree that I would like to see. For that reason I did suggest at this point we may want to pack a small piece of packing strip into this region to allow it to more effectively wick out fluid. 10/11/18 in general the patient today does not feel that she has been doing very well. She's been a little bit lethargic and  subsequently is having bodyaches as well according to what she tells me today. With that being said overall she has been concerned with the fact that something may be worsening although to be honest her wounds really have not been appearing poorly. She does have a new ulcer on her left heel unfortunately. This may be pressure related. Nonetheless it seems to me to have potentially started at least as a blister I do not see any evidence of deep tissue injury. In regard to the left ankle the snap vac still seems to be causing the ceiling off of the deeper part of the wound which is in turn trapping fluid. I'm not extremely pleased with the overall appearance as far as progress from last week to this week therefore I'm gonna discontinue the snap vac at this point. 10/18/18 patient unfortunately  this point has not been feeling well for the past several days. She was seen by Grayland Ormond her primary care provider who is a Librarian, academic at [facility]. Subsequently she states that she's been very weak and generally feeling malaise. No fevers, chills, nausea, or vomiting noted at this time. With that being said bloodwork was performed at the PCP office on the 11th of this month which showed a white blood cell count of 10.7. This was repeated today and shows a white blood cell count of 12.4. This does show signs of worsening. Coupled with the fact that she is feeling worse and that her left ankle wound is not really showing signs of improvement I feel like this is an indication that the osteomyelitis is likely exacerbating not improving. Overall I think we may also want to check her C-reactive protein and sedimentation rate. Actually did call [facility] office this afternoon while the patient was in the office here with me. Subsequently based on the findings we discussed treatment possibilities and I think that it is appropriate for [place] to go ahead and initiate treatment with doxycycline which I'm going to do. Subsequently he did agree to see about adding a CRP and sedimentation rate to her orders. If that has not already been drawn to where they can run it they will contact the patient she can come back to have that check. They are in agreement with plan as far as the patient and her daughter are concerned. Nonetheless also think we need to get in touch with Dr. Henreitta Leber office to see about getting the patient scheduled with him as soon as possible. 11/08/18 on evaluation today patient presents for follow-up concerning her bilateral foot and ankle ulcers. I did do an extensive review of her chart in epic today. Subsequently she was seen by [name] he did initiate Cefepime IV antibiotic therapy. Subsequently she had some issues with her PICC line this had to be removed because it  was coiled and then replaced. Fortunately that was now settled. Unfortunately she has continued have issues with her left heel as well as the issues that she is experiencing with her bilateral lateral malleolus regions. I do believe however both areas seem to be doing a little bit better on evaluation today which is good news. No fevers, chills, nausea, or vomiting noted at this time. She actually has an angiogram schedule with Dr. dew on this coming Monday, November 11, 2018. Subsequently the patient states that she is feeling much better especially than what she was roughly 2 weeks ago. She actually had to cancel an appointment because she was feeling so poorly. No fevers,  chills, nausea, or vomiting noted at this time. 11/15/18 on evaluation today patient actually is status post having had her angiogram with Dr. dew Monday, four days ago. 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 He had to go and work on several areas of the vasculature fortunately he was able to obtain no more than a 30% residual stenosis throughout post procedure. I reviewed this note today. I think this will definitely help with healing at this time. Fortunately there does not appear to be any signs of infection and I do feel like ratio already has a better appearance to it. 11/22/18 upon evaluation today patient actually appears to be doing very well in regard to her wounds in general. The right lateral malleolus looks excellent the heel looks better in the left lateral malleolus also appears to be doing a little better. With that being said the right second toe actually appears to be open and training we been watching this is been dry and stable but now is open. 12/03/2018 Seen today for follow-up and management of multiple bilateral lower extremity wounds. New pressure injury of the great toe which is closed at this time. Wound of the right distal second toe appears larger today with deep undermining  and a pocket of fluid present within the undermining region. Left and right malleolus is wounds are stable today with no signs and symptoms of infection.Denies any needs or concerns during exam today. 12/13/18 on evaluation today patient appears to be doing somewhat better in regard to her left heel ulcer. She also seems to be completely healed in regard to the right lateral malleolus ulcer. The left malleolus ulcer is smaller what unfortunately the wounds which are new over the first and second toes of the right foot are what are most concerning at this point especially the second. Both areas did require sharp debridement today. 12/20/18 on evaluation today patient's wound actually appears to be doing better in regard to left lateral ankle and her right lateral ankle continues to remain healed. The hill ulcer on the left is improved. She does have improvement noted as well in regard to both toe ulcers. Overall I'm very pleased in this regard. No fevers, chills, nausea, or vomiting noted at this time. 12/23/18 on evaluation today patient is seen after she had her toenails trimmed at the podiatrist office due to issues with her right great toe. There was what appeared to be dark eschar on the surface of the wound which had her in the podiatrist concerned. Nonetheless as I remember that during the last office visit I had utilize silver nitrate of this area I was much less concerned about the situation. Subsequently I was able to clean off much of this tissue without any complication today. This does not appear to show any signs of infection and actually look somewhat better Somera, [name] ([id number]) compared to last time post debridement. Her second toe on the right foot actually had callous over and there did appear still be some fluid underneath this that would require debridement today. 12/27/18 on evaluation today patient actually appears to be showing signs of improvement at all locations. Even  the left lateral ankle although this is not quite as great as the other sites. Fortunately there does not appear to be any signs of infection at this time and both of her toes on the right foot seem to be showing signs of improvement which is good news and very pleased  in this regard. 01/03/19 on evaluation today patient appears to be doing better for the most part in regard to her wounds in particular. There does not appear to be any evidence of infection at this time which is good news. Fortunately there is no sign of really worsening anywhere except for the right great toe which she does have what appears to be a bruise/deep tissue injury which is very superficial and already resolving. I'm not sure where this came from I questioned her extensively and she does not recall what may have happened with this. Other than that the patient seems to be doing well even the left lateral ankle ulcer looks good and is getting smaller. 01/10/19 on evaluation today patient appears to be doing well in regard to her left heel wound and both of her toe wounds. Overall I feel like there is definitely improvement here and I'm happy in that regard. With that being said unfortunately she is having issues with the left lateral malleolus ulcer which unfortunately still has a lot of depth to it. This is gonna be a very difficult wound for [place] to be able to truly get to heal. I may want to consider some type of skin substitute to see if this would be of benefit for her. I'll discuss this with her more the next visit most likely. This was something I thought about more at the end of the visit when I was Artie out of the room and the patient had been discharged. 01/17/19 on evaluation today patient appears to be doing very well in regard to her wounds in general. She's been making excellent progress at this time. Fortunately there's no sign of infection at this time either. No fevers, chills, nausea, or vomiting noted at this  time. The biggest issue is still her left lateral malleolus where it appears to be doing well and is getting smaller but still shows a small corner where this is deeper and goes down into what appears to be the joint space. Nonetheless this is taking much longer to heal although it still looks better in smaller than previous evaluations. 01/24/19 on evaluation today patient's wounds actually appear to be doing rather well in general overall. She did require some sharp debridement in regard to the right great toe but everything else appears to be doing excellent no debridement was even necessary. No fevers, chills, nausea, or vomiting noted at this time. 01/31/19 on evaluation today patient actually appears to be doing much better in regard to her left foot wound on the heel as well as the ankle. The right great toe appears to be a little bit worse today this had callous over and trapped a lot of fluid underneath. Fortunately there's no signs of infection at any site which is great news. 02/07/19 on evaluation today patient actually appears to be doing decently well in regard to all of her ulcers at this point. No sharp debridement was required she is a little bit of hyper granulation in regard to the left lateral ankle as well as the left heel but the hill itself is almost completely healed which is excellent news. Overall been very pleased in this regard. 02/14/19 on evaluation today patient actually appears to be doing very well in regard to her ulcers on the right first toe, left lateral malleolus, and left heel. In fact the heel is almost completely healed at this point. The patient does not show any signs of infection which is good news. Overall very  pleased with how things have progressed. 04/18/19 Telehealth Evaluation During the COVID-19 National Emergency: Verbal Consent: Obtained from patient Allergies: reviewed and the active list is current. Medication changes: patient has no current  medication changes. COVID-19 Screening: 1. Have you traveled internationally or on a cruise ship in the last 14 dayso No 2. Have you had contact with someone with or under investigation for COVID-19o No 3. Have you had a fever, cough, sore throat, or experiencing shortness of breatho No on evaluation today actually did have a visit with this patient through a telehealth encounter with her home health nurse. Subsequently it was noted that the patient actually appears to be doing okay in regard to her wounds both the right great toe as well as the left lateral malleolus have shown signs of improvement although this in your theme around the left lateral malleolus there eschar coverings for both locations. The question is whether or not they are actually close and whether or not home health needs to discharge the patient or not. Nonetheless my concern is this point obviously is that without actually seeing her and being able to evaluate this directly I cannot ensure that she is completely healed which is the question that I'm being asked. 04/22/19 on evaluation today patient presents for her first evaluation since last time I saw her which was actually February 14, 2019. I did do a telehealth visit last week in which point it was questionable whether or not she may be healed and had to bring her in today for confirmation. With that being said she does seem to be doing quite well at this point which is good news. There does not appear to be any drainage in the deed I believe her wounds may be healed. Readmission: 09/04/2019 on evaluation today patient appears to be doing unfortunately somewhat more poorly in regard to her left foot ulcer secondary to a wound that began on 08/21/2019 at least when she first noticed this. Fortunately she has not had any evidence of active infection at this time. Systemically. I also do not necessarily see any evidence of infection at the blister/wound site on the first  metatarsal head plantar aspect. This almost appears to be something that may have just rubbed inappropriately causing this to breakdown. They did not want a wait too long to come in to be seen as again she had significant issues in the past with wounds that took quite a while to heal in fact it was close to 2 years. Nonetheless this does not appear to be quite that bad but again we do need to remove some of the necrotic tissue from the surface of the wound to tell exactly the extent. She does not appear to have any significant arterial disease at this point and again her last ABIs and TBI's are recorded above in the alert section her left ABI was 1.27 with a TBI of 0.72 to the right ABI 1.08 with a TBI of 0.39. Other than this the patient has been doing quite well since I last saw her and that was in May 2020. 09/11/2019 on evaluation today patient appeared to be doing very well with regard to her plantar foot ulcer on the left. In fact this appears to be almost completely healed which is awesome. That is after just 1 week of intervention. With that being said there is no signs of active infection at this time. AMMY, LIENHARD ([id number]) 09/18/2019 on evaluation today patient actually appears to be doing  excellent in fact she is completely healed based on what I am seeing at this point. Fortunately there is no signs of active infection at this time and overall patient is very pleased to hear that this area has healed so quickly. Readmission: 05/13/2021 upon evaluation today this patient presents for reevaluation here in the clinic. This is a wound that actually we previously took care of. She had 1 on the right ankle and the left the left turned out to be be harder due to to heal but nonetheless is doing great at this point as the right that has reopened and it was noted first just several weeks ago with a scab over it and came off in just the past few days. Fortunately there does not appear to  be any obvious evidence of significant active infection at this time which is great news. No fevers, chills, nausea, vomiting, or diarrhea. 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 There does not appear to be any signs of this interfering in any way with her wound. She does have swelling we previously had compression socks for her ordered but again it does not look like she wears these on a regular basis by any means. 05/26/2021 upon evaluation today patient appears to be doing well with regard to her wound which is actually showing signs of excellent improvement. There does not appear to be any signs of active infection which is great news and overall very pleased with where things stand today. No fevers, chills, nausea, vomiting, or diarrhea. 06/02/2021 upon evaluation today patient's wound actually showing signs of excellent improvement. Fortunately there does not appear to be any signs of active infection which is great news. I think the patient is making good progress with regard to her wounds in general. 06/09/2021 upon evaluation today patient appears to be doing excellent in regard to her wounds currently. Fortunately there does not appear to be any signs of active infection which is great news. No fevers, chills, nausea, vomiting, or diarrhea. Overall extremely pleased with where things stand today. I think the patient is making excellent progress. 06/16/2021 upon evaluation today patient appears to be doing well in regard to her wound. This is going require little bit of debridement today and that was discussed with the patient. Otherwise she seems to be doing quite well and I am actually very pleased with where things stand at this point. No fevers, chills, nausea, vomiting, or diarrhea. 06/23/2021 upon evaluation today patient appears to be doing well with regard to her wounds. She has been tolerating the dressing changes without complication.  Fortunately there does not appear to be any evidence of infection and she has not had air in her home which she actually lives at an assisted living that got fixed this morning. With that being said because of that her wrap has been extremely hot and bothersome for her over the past week. 06/30/2021 upon evaluation today patient is actually making excellent progress in regard to her ankle ulcer. She has been tolerating the dressing changes without complication and overall extremely pleased with where things stand there does not appear to be any evidence of active infection which is great news. No fevers, chills, nausea, vomiting, or diarrhea. 07/07/21 upon evaluation today patients and culture on the right actually appears to be doing quite well. There does not appear to be any signs of infection and overall very pleased with where things stand today. No fevers,  chills, nausea, or vomiting noted at this time. 07/14/2021 unfortunately the patient today has some evidence of deep tissue injury and pressure getting to the ankle region. Again I am not exactly sure what is going on here but this is very similar to issues that we have had in the past. I explained to the patient that she needs to be very mindful of exactly what is happening I think sleeping in bed is probably the main issue here although there could be other culprits I am not sure what else would potentially lead to this kind of a problem for her. 07/21/2021 upon evaluation today patient's wound actually showing signs of improvement compared to last week. Fortunately there does not appear to be any signs of active infection which is great news and overall very pleased with where things stand in that regard. With that being said I do believe that she is continuing to show signs of overall of getting better although I think this is still basically about what we were 2 weeks ago due to the worsening and now improvement. 07/28/2021 upon evaluation  today patient appears to be doing well with regard to her wound. She does have some slough buildup on the surface of the wound which I would have to manage today. Fortunately there is no sign of active infection at this time. No fevers, chills, nausea, vomiting, or diarrhea. 08/04/2021 upon evaluation today patient appears to be doing about the same in regard to her wound. To be perfectly honest I am beginning to be a little bit concerned about the overall appearance of the wound bed. I do think possibly taking a sample right around the margin of the wound could be beneficial for her as far as identifying anything such as an inflammatory process or to be honest even a skin tag cancer type process that may be of concern here. Fortunately there does not appear to be any evidence of active infection at this time which is great news she is not having any pain also great news. 08/11/2021 upon evaluation today patient appears to be doing well with regard to her wound. The good news is I did review her biopsy results and it showed some inflammatory mixed findings but nothing that appeared to be malignant which is great news. Overall this is more of a chronic venous stasis type issue which again is more what we have been treating. Nonetheless I just wanted to make sure before going forward that there was not anything more untoward going on at this point. 08/18/2021 upon evaluation today patient appears to be doing well with regard to her ankle ulcer. Fortunately there does not appear to be any signs of active infection at this time which is great overall wound is dramatically improved compared to last week. Since last week I have actually placed her on doxycycline and subsequently this is a good option as far as the findings are concerned at this point. I do believe that the positive result of MRSA is definitely something that needed to be addressed and the good news is The doxycycline is doing a good job of doing  this. the doxycycline is doing that. There does not appear to be any evidence of active infection systemically which is great news. 08/25/2021 upon evaluation today patient appears to be doing well with regard to her wound. I feel like we are finally get back on track as far as healing is concerned I am much happier with the overall appearance today. I  do think that she is tolerating the dressing changes without complication which is great news. We have been using Hydrofera Blue which I think is a good option. The good news is she is also doing great in regard to her compression sock on the left which is a zipper compression that seems to be doing a great job keeping her edema under good control. 09/01/2021 upon evaluation today patient appears to be doing well with regard to her wound. Infection seems to be under much better control which is great news and very pleased in that regard. Fortunately there does not appear to be any signs of infection currently. ELLEAN, FIRMAN ([id number]) 09/08/2021 upon evaluation today patient actually appears to be making good progress in regard to her wound. She has been tolerating the dressing changes without complication. Fortunately there does not appear to be any evidence of active infection at this time which is great news as well. No fevers, chills, nausea, vomiting, or diarrhea. 09/22/2021 upon evaluation today patient appears to be doing well with regard to her wound although is very slow to heal. We have not looked into Apligraf yet I think that is something that we should see about doing. 09/29/2021 upon evaluation today patient's wound is actually showing signs of doing about the same. I am not seeing any evidence of worsening but also no significant evidence of improvement. We did gain approval for the organogenesis products all except for Apligraf as covered by her insurance. With that being said I do think that we can go ahead and proceed with the  NuShield if the patient and her family in agreement of the plan I discussed that with him today she does have a 20% coinsurance which we also discussed. 10/06/2021 upon evaluation today patient appears to unfortunately be doing a little bit worse she appears to be infected based on what I am seeing. Fortunately there does not appear to be any signs of active infection at this time which is great news. Unfortunately it does appear to be some evidence of around the wound edge indicated by way of erythema and warmth as well as redness 10/13/2021 upon evaluation today patient actually appears to be doing excellent in regard to her ankle ulcer compared to what it was. 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 I think were fine to go ahead and apply the NuShield today. 10/20/2021 upon evaluation today patient appears to be doing excellent in fact the NuShield seems to have done all some for her thus far. I am actually very pleased with where we stand and overall I think that she is making great progress. There is no evidence of active infection at this time. 11/23; patient presents for follow-up. She has no issues or complaints today. She denies signs of infection. She reports stability in her wound healing. 11/03/2021 upon evaluation today patient appears to be doing well with regard to her wounds. She has been tolerating the dressing changes without complication. Fortunately there is no signs of active infection at this time. 11/10/2021 upon evaluation today patient appears to be doing well with regard to her right ankle which is actually showing signs of improvement with the NuShield I am very pleased. Subsequently in regards to the left ankle this appears to be doing okay with no  evidence of issue here  either. With that being said she has a significant contusion on the left leg from having fallen 2 days ago. She tells me that she was using her walker going to the closet and then when she got to the closet turned around to get something out at which point she fell according to the story. Nonetheless she does have a hematoma just below her knee on the anterior portion of her shin. My hope is that this will not open until wound although we do need I think Some compression over it also think that we need to have her use an ice as well to help with the swelling and prevent this from getting worse. The last thing she needs is a wound opened up here. 11/17/2021 upon evaluation patient's right ankle actually showing signs of improvement based on what I see currently there is a lot of new skin growth coming in which is great news. Nonetheless I do feel like that the patient is showing improvement as well in regard to her left leg with the use of the Tubigrip it swollen but not as bruised as what I would have expected after what I was seeing last week. Nonetheless I do think doubling up on the Tubigrip would probably be beneficial to try to keep some of the edema under better control here. 11/24/2021 upon evaluation today patient appears to be doing excellent in regard to her wound. This is actually measuring significantly smaller which is great news. Fortunately I do not see any signs of active infection locally nor systemically at this point. Overall I am very pleased with how the NuShield is doing. 12/30; wound bed looks healthy however not much change in overall wound volume. We applied Nushield again today in the standard fashion 12/08/2021 upon evaluation today patient's wound actually showing signs of good improvement and I am actually very pleased with where we stand today as well. I do not see any evidence of active infection locally nor systemically which is great news. Unfortunately she has been  having some issues with stomach upset and diarrhea today. 12/15/2021 upon evaluation today patient appears to be doing excellent in regard to her wound. There is a little bit of dry skin raised up around the edges of the wound but fortunately nothing too significant at this point. Fortunately I do not see any evidence of active infection either which is great news. No fevers, chills, nausea, vomiting, or diarrhea. 12/29/2021 upon evaluation today patient appears to be doing well with regard to her wound. In general I feel like she is getting very close to complete resolution. I think that she is making good progress here as well. Overall I do not see any signs of infection and very little of this appears to actually be open. 01/12/2022 upon evaluation today patient appears to be doing a little bit worse in regard to her wound. It appears that the collagen actually trapped fluid underneath the collagen which got hard and subsequently caused the fluid to back up. This patient has made the wound appear to be larger than what it was previous. With that being said I do not see any signs of obvious infection is not warm to touch and not significantly erythematous either which is good news. Nonetheless I do think that we will need to continue to keep an eye on this. Probably not go put on antibiotic this week but I will be too far from doing so she is actually on  a Z-Pak right now I do not want to really double up on antibiotics. 01/26/2022 upon evaluation today patient's wound is showing signs of improvement although this is slow to turn back around. Fortunately there does not appear to be any evidence of active infection locally or systemically which is great news and overall I am extremely pleased with where we stand today. No fevers, chills, nausea, vomiting, or diarrhea. 02/02/2022 upon evaluation today patient appears to be doing well with regard to her wound. She has been tolerating the dressing changes  without complication. Fortunately there does not appear to be any signs of active infection locally nor systemically at this time which is great news. No fevers, chills, nausea, vomiting, or diarrhea. 3/9; patient presents for follow-up. She has no issues or complaints today. She denies signs of infection. 02/16/2022 upon evaluation today patient appears to be doing well with regard to her wound. She has been tolerating the dressing changes without complication. Fortunately I do not see any evidence of active infection locally nor systemically at this point which is great news. Overall I PAYGE, EPPES ([id number]) think that the patient is making excellent progress in general. The wound is measuring smaller and though there does appear to be some need for sharp debridement today this is minimal compared to what we have noted previous. 02/23/2022 upon evaluation today patient appears to be doing well with regard to her wound she is definitely showing signs of improvement which is great news. 03/02/2022 upon evaluation today patient appears to be doing about the same in regard to her wound. Unfortunately were not seeing significant improvement. With that being said is also not significantly worse which is great news. No fevers, chills, nausea, vomiting, or diarrhea. 03-09-2022 upon evaluation today patient appears to be doing well with regard to her wound. She has been tolerating the dressing changes without complication. Fortunately there does not appear to be any signs of active infection locally or systemically which is great news. 03-16-2022 upon evaluation today patient appears to be doing well with regard to her wound this is measuring smaller and looking much better. I am actually very pleased with where we stand and I think that the patient is making great progress. I do not see any evidence of active infection locally or systemically which is great news. No fevers, chills, nausea, vomiting, or  diarrhea. 03-23-2022 upon evaluation today patient appears to be doing better in regard to her wounds. In fact the wound area actually is showing signs of excellent improvement and actually very pleased with where we stand today. I do not see any evidence of active infection locally nor systemically which is great news. 4/27; comes in today with again thick callus around the wound circumference which I removed with a curette. Some debris on the surface. Overall I do not think quite as good as it was last week by description. Using endoform 04-06-2022 upon evaluation today patient appears to be doing pretty well in regard to her wound. Fortunately I do not see any evidence of active infection locally or systemically which is great news and overall I am pleased in that regard. I do feel like this is measuring smaller postdebridement compared to where we were previous. Overall she is headed in the right direction. She does have an area that is threatening to open and looks a little irritated on the left ankle we did measure this just for the discolored area for now its not draining too much but we  want to monitor and make sure nothing worsens here. 04-13-2022 upon evaluation today patient appears to be doing better in regard to her wound although this is still very slow to heal. We have reapplied for a skin substitute but have not heard anything back as of yet. Fortunately I do not see any evidence of active infection locally or systemically at this time which is great news. 04-20-2022 upon evaluation today patient's wound is actually showing signs of significant improvement which is great news. We did get approval for skin substitutes which I think is an option here for [place] but at the same time I am not even certain that this is going to be necessary especially considering how well things seem to be doing at this point. If she continues healing as we see her right now but I think were probably going to  be able to avoid any need for additional skin substitute. Patient and her daughter are both in agreement with that plan. With that being said she did have a fall she has several areas of contusion fortunately nothing that is can require intervention at this point but nonetheless she did also fracture her rib which is extremely uncomfortable obviously. Electronic Signature(s) Signed: 04/20/2022 2:13:47 PM By: Worthy Keeler PA-C Entered By: Worthy Keeler on 04/20/2022 14:13:47 Ebbert, [name] ([id number]) -------------------------------------------------------------------------------- Physical Exam Details Patient Name: [name] Date of Service: 04/20/2022 10:15 AM Medical Record Number: [id number] Patient Account Number: [id number] Date of Birth/Sex: [birth year] (88 y.o. F) Treating RN: Carlene Coria Primary Care Provider: Adrian Prows Other Clinician: Referring Provider: Adrian Prows Treating Provider/Extender: Skipper Cliche in Treatment: 42 Constitutional Well-nourished and well-hydrated in no acute distress. Respiratory normal breathing without difficulty. Psychiatric this patient is able to make decisions and demonstrates good insight into disease process. Alert and Oriented x 3. pleasant and cooperative. Notes Upon inspection patient's wound bed actually showed signs of good granulation nebulization at this point. Fortunately there does not appear to be any evidence of infection locally or systemically which is great news and overall I am very pleased I do feel like the wound is very close to complete closure on the right. Electronic Signature(s) Signed: 04/20/2022 2:14:05 PM By: Worthy Keeler PA-C Entered By: Worthy Keeler on 04/20/2022 14:14:05 Flinchum, [name] ([id number]) -------------------------------------------------------------------------------- Physician Orders Details Patient Name: [name] Date of Service: 04/20/2022 10:15  AM Medical Record Number: [id number] Patient Account Number: [id number] Date of Birth/Sex: September 17, 1923 (88 y.o. 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 Bathing/ Shower/ Hygiene o May shower with wound dressing protected with water repellent cover or cast protector. Edema Control - Lymphedema / Segmental Compressive Device / Other o Optional: One layer of unna paste to top of compression wrap (to act as an anchor). - and at toes o Patient to wear own compression stockings. Remove compression stockings every night before going to bed and put on every morning when getting up. - left leg o Elevate, Exercise Daily and Avoid Standing for Long Periods of Time. o Elevate legs to the level of the heart and pump ankles as  often as possible o Elevate leg(s) parallel to the floor when sitting. Wound Treatment Wound #7 - Malleolus Wound Laterality: Right, Lateral Cleanser: Soap and Water 1 x Per Week/30 Days Discharge Instructions: Gently cleanse wound with antibacterial soap, rinse and pat dry prior to dressing wounds Primary Dressing: Endoform 2x2 (in/in) 1 x Per Week/30 Days Discharge Instructions: Apply Endoform as directed Secondary Dressing: Gauze 1 x Per Week/30 Days Discharge Instructions: As directed: dry, moistened with saline or moistened with Dakins Solution Secondary Dressing: mepitil one 1 x Per Week/30 Days Compression Wrap: 3-LAYER WRAP - Profore Lite LF 3 Multilayer  Compression Bandaging System 1 x Per Week/30 Days Discharge Instructions: Apply 3 multi-layer wrap as prescribed. Wound #8 - Ankle Wound Laterality: Left, Lateral Primary Dressing: (BORDER) Zetuvit Plus Silicone Border Dressing 4x4 (in/in) Electronic Signature(s) Signed: 04/20/2022 4:24:09 PM By: Carlene Coria RN Signed: 04/20/2022 5:51:16 PM By: Worthy Keeler PA-C Entered By: Carlene Coria on 04/20/2022 10:46:07 Whan, [name] ([id number]) -------------------------------------------------------------------------------- Problem List Details Patient Name: [name] Date of Service: 04/20/2022 10:15 AM Medical Record Number: [id number] Patient Account Number: [id number] Date of Birth/Sex: 1923-07-30 (88 y.o. 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, stage 3 05/13/2021 No Yes I89.0 Lymphedema, not elsewhere classified 05/13/2021 No Yes I87.2 Venous insufficiency (chronic) (peripheral) 05/13/2021 No Yes Z95.0 Presence of cardiac pacemaker 05/13/2021 No Yes Z79.01 Long term (current) use of anticoagulants 05/13/2021 No Yes Inactive Problems ICD-10 Code Description Active Date Inactive Date S80.12XA Contusion of left lower leg, initial encounter 11/10/2021 11/10/2021 Resolved Problems Electronic Signature(s) Signed: 04/20/2022 9:57:20 AM By: Worthy Keeler PA-C Entered By: Worthy Keeler on 04/20/2022 09:57:20 Forker, [name] ([id number]) -------------------------------------------------------------------------------- Progress Note Details Patient Name: [name] Date of Service: 04/20/2022 10:15 AM Medical Record Number: [id number] Patient Account Number: [id number] Date of Birth/Sex: October 29, 1923 (88 y.o.  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 Dr. Amalia Hailey the podiatrist saw her on 07/20/2017 and sent her to the wound center for possible hyperbaric oxygen therapy. past medical history of peripheral vascular disease, varicose veins, status post appendectomy, basal cell carcinoma excision from the left leg, cholecystectomy, pacemaker placement, right lower extremity angiography done by Dr. dew in March 2017 with placement of a stent. there is also note of a successful ablation of the right small saphenous vein done which was reviewed by ultrasound on 10/24/2016. the patient had a right small saphenous vein ablation done on 10/20/2016. The patient has never been a smoker. She has been seen by Dr. Corene Cornea dew the vascular surgeon who most recently saw her on 06/15/2017 for evaluation of ongoing problems with right leg swelling. She had a lower extremity arterial duplex examination done(02/13/17) which showed patent distal right superficial femoral artery stent and above-the-knee popliteal stent without evidence of restenosis. The ABI was more than 1.3 on the right and more than 1.3 on the left. This was consistent with noncompressible arteries due to medial calcification. The right great  toe pressure and PPG waveforms are within normal limits and the left great toe pressure and PPG waveforms are decreased. he recommended she continue to wear her compression stockings and continue with elevation. She is scheduled to have a noninvasive arterial study in the  near future 08/16/2017 -- had a lower extremity arterial duplex examination done which showed patent distal right superficial femoral artery stent and above-the-knee popliteal stent without evidence of restenosis. The ABI was more than 1.3 on the right and more than 1.3 on the left. This was consistent with noncompressible arteries due to medial calcification. The right great toe pressure and PPG waveforms are within normal limits and the left great toe pressure and PPG waveforms are decreased. the x-ray of the right ankle has not yet been done 08/24/2017 -- had a right ankle x-ray -- IMPRESSION:1. No fracture, bone lesion or evidence of osteomyelitis. 2. Lateral soft tissue swelling with a soft tissue ulcer. she has not yet seen the vascular surgeon for review 08/31/17 on evaluation today patient's wound appears to be showing signs of improvement. She still with her appointment with vascular in order to review her results of her vascular study and then determine if any intervention would be recommended at that time. No fevers, chills, nausea, or vomiting noted at this time. She has been tolerating the dressing changes without complication. 09/28/17 on evaluation today patient's wound appears to show signs of good improvement in regard to the granulation tissue which is surfacing. There is still a layer of slough covering the wound and the posterior portion is still significantly deeper than the anterior nonetheless there has been some good sign of things moving towards the better. She is going to go back to Dr. dew for reevaluation to ensure her blood flow is still appropriate. That will be before her next evaluation with [place] next week. No fevers, chills, nausea, or vomiting noted at this time. Patient does have some discomfort rated to be a 3-4/10 depending on activity specifically cleansing the wound makes it worse. 10/05/2017 -- the patient was seen by Dr. Lucky Cowboy last week and noninvasive studies  showed a normal right ABI with brisk triphasic waveforms consistent with no arterial insufficiency including normal digital pressures. The duplex showed a patent distal right SFA stent and the proximal SFA was also normal. He was pleased with her test and thought she should have enough of perfusion for normal wound healing. He would see her back in 6 months time. 12/21/17 on evaluation today patient appears to be doing fairly well in regard to her right lateral ankle wound. Unfortunately the main issue that she is expansion at this point is that she is having some issues with what appears to be some cellulitis in the right anterior shin. She has also been noting a little bit of uncomfortable feeling especially last night and her ankle area. I'm afraid that she made the developing a little bit of an infection. With that being said I think it is in the early stages. 12/28/17 on evaluation today patient's ankle appears to be doing excellent. She's making good progress at this point the cellulitis seems to have improved after last week's evaluation. Overall she is having no significant discomfort which is excellent news. She does have an appointment with Dr. dew on March 29, 2018 for reevaluation in regard to the stent he placed. She seems to have excellent blood flow in the right lower extremity. 01/19/12 on evaluation today patient's wound appears to be doing very well.  In fact she does not appear to require debridement at this point, there's no evidence of infection, and overall from the standpoint of the wound she seems to be doing very well. With that being said I believe that it may be time to switch to different dressing away from the [facility] Dressing she tells me she does have a lot going on her friend actually passed away yesterday and she's also having a lot of issues with her husband this obviously is weighing heavy on her as far as your thoughts and concerns today. 01/25/18 on evaluation  today patient appears to be doing fairly well in regard to her right lateral malleolus. She has been tolerating the dressing changes without complication. Overall I feel like this is definitely showing signs of improvement as far as how the overall appearance of the wound is there's also evidence of epithelium start to migrate over the granulation tissue. In general I think that she is progressing nicely as far as the wound is concerned. The only concern she really has is whether or not we can switch to every other week visits in order to avoid having as many MARVINA, DANNER ([id number]) appointments as her daughters have a difficult time getting her to her appointments as well as the patient's husband to his he is not doing very well at this point. 02/22/18 on evaluation today patient's right lateral malleolus ulcer appears to be doing great. She has been tolerating the dressing changes without complication. Overall you making excellent progress at this time. Patient is having no significant discomfort. 03/15/18 on evaluation today patient appears to be doing much more poorly in regard to her right lateral ankle ulcer at this point. Unfortunately since have last seen her her husband has passed just a few days ago is obviously weighed heavily on her her daughter also had surgery well she is with her today as usual. There does not appear to be any evidence of infection she does seem to have significant contusion/deep tissue injury to the right lateral malleolus which was not noted previous when I saw her last. It's hard to tell of exactly when this injury occurred although during the time she was spending the night in the hospital this may have been most likely. 03/22/18 on evaluation today patient appears to actually be doing very well in regard to her ulcer. She did unfortunately have a setback which was noted last week however the good news is we seem to be getting back on track and in fact the  wound in the core did still have some necrotic tissue which will be addressed at this point today but in general I'm seeing signs that things are on the up and up. She is glad to hear this obviously she's been somewhat concerned that due to the how her wound digressed more recently. 03/29/18 on evaluation today patient appears to be doing fairly well in regard to her right lower extremity lateral malleolus ulcer. She unfortunately does have a new area of pressure injury over the inferior portion where the wound has opened up a little bit larger secondary to the pressure she seems to be getting. She does tell me sometimes when she sleeps at night that it actually hurts and does seem to be pushing on the area little bit more unfortunately. There does not appear to be any evidence of infection which is good news. She has been tolerating the dressing changes without complication. She also did have some bruising in the  left second and third toes due to the fact that she may have bump this or injured it although she has neuropathy so she does not feel she did move recently that may have been where this came from. Nonetheless there does not appear to be any evidence of infection at this time. 04/12/18 on evaluation today patient's wound on the right lateral ankle actually appears to be doing a little bit better with a lot of necrotic docking tissue centrally loosening up in clearing away. However she does have the beginnings of a deep tissue injury on the left lateral malleolus likely due to the fact we've been trying offload the right as much as we have. I think she may benefit from an assistive soft device to help with offloading and it looks like they're looking at one of the doughnut conditions that wraps around the lower leg to offload which I think will definitely do a good job. With that being said I think we definitely need to address this issue on the left before it becomes a wound. Patient is not  having significant pain. 04/19/18 on evaluation today patient appears to be doing excellent in regard to the progress she's made with her right lateral ankle ulcer. The left ankle region which did show evidence of a deep tissue injury seems to be resolving there's little fluid noted underneath and a blister there's nothing open at this point in time overall I feel like this is progressing nicely which is good news. She does not seem to be having significant discomfort at this point which is also good news. 04/25/18-She is here in follow up evaluation for bilateral lateral malleolar ulcers. The right lateral malleolus ulcer with pale subcutaneous tissue exposure, central area of ulcer with tendon/periosteum exposed. The left lateral malleolus ulcer now with central area of nonviable tissue, otherwise deep tissue injury. She is wearing compression wraps to the left lower extremity, she will place the right lower extremity compression wraps on when she gets home. She will be out of town over the weekend and return next week and follow-up appointment. She completed her doxycycline this morning 05/03/18 on evaluation today patient appears to be doing very well in regard to her right lateral ankle ulcer in general. At least she's showing some signs of improvement in this regard. Unfortunately she has some additional injury to the left lateral malleolus region which appears to be new likely even over the past several days. Again this determination is based on the overall appearance. With that being said the patient is obviously frustrated about this currently. 05/10/18-She is here in follow-up evaluation for bilateral lateral malleolar ulcers. She states she has purchased offloading shoes/boots and they will arrive tomorrow. She was asked to bring them in the office at next week's appointment so her provider is aware of product being utilized. She continues to sleep on right or left side, she has been  encouraged to sleep on her back. The right lateral malleolus ulcer is precariously close to peri-osteum; will order xray. The left lateral malleolus ulcer is improved. Will switch back to santyl; she will follow up next week. 05/17/18 on evaluation today patient actually appears to be doing very well in regard to her malleolus her ulcers compared to last time I saw them. She does not seem to have as much in the way of contusion at this point which is great news. With that being said she does continue to have discomfort and I do believe that she is  still continuing to benefit from the offloading/pressure reducing boots that were recommended. I think this is the key to trying to get this to heal up completely. 05/24/18 on evaluation today patient actually appears to be doing worse at this point in time unfortunately compared to her last week's evaluation. She is having really no increased pain which is good news unfortunately she does have more maceration in your theme and noted surrounding the right lateral ankle the left lateral ankle is not really is erythematous I do not see signs of the overt cellulitis on that side. Unfortunately the wounds do not seem to have shown any signs of improvement since the last evaluation. She also has significant swelling especially on the right compared to previous some of this may be due to infection however also think that she may be served better while she has these wounds by compression wrapping versus continuing to use the Juxta-Lite for the time being. Especially with the amount of drainage that she is experiencing at this point. No fevers, chills, nausea, or vomiting noted at this time. 05/31/18 on evaluation today patient appears to actually be doing better in regard to her right lateral lower extremity ulcer specifically on the malleolus region. She has been tolerating the antibiotic without complication. With that being said she still continues to have issues  but a little bit of redness although nothing like she what she was experiencing previous. She still continues to pressure to her ankle area she did get the problem on offloading boots unfortunately she will not wear them she states there too uncomfortable and she can't get in and out of the bed. Nonetheless at this point her wounds seem to be continually getting worse which is not what we want I'm getting somewhat concerned about her progress and how things are going to proceed if we do not intervene in some way shape or form. I therefore had a very lengthy conversation today about offloading yet again and even made a specific suggestion for switching her to a memory foam mattress and even gave the information for a specific one that they could look at getting if it was something that they were interested in considering. She does not want to be considered for a hospital bed air mattress although honestly insurance would not cover it that she does not have any wounds on her trunk. 06/14/18 on evaluation today both wounds over the bilateral lateral malleolus her ulcers appear to be doing better there's no evidence of pressure injury at this point. She did get the foam mattress for her bed and this does seem to have been extremely beneficial for her in my pinion. Her daughter states that she is having difficulty getting out of bed because of how soft it is. The patient also relates this to be. Nonetheless I do feel like she's actually doing better. Unfortunately right after and around the time she was getting the mattress she also sustained a fall when she got up to go pick up the phone and ended up injuring her right elbow she has 18 sutures in place. We are not caring for this currently although home health is going to be taking the sutures out shortly. Nonetheless this may be something that we need to evaluate going forward. It depends on [name], [name] ([id number]) how well it has or has not  healed in the end. She also recently saw an orthopedic specialist for an injection in the right shoulder just before her fall unfortunately the  fall seems to have worsened her pain. 06/21/18 on evaluation today patient appears to be doing about the same in regard to her lateral malleolus ulcers. Both appear to be just a little bit deeper but again we are clinging away the necrotic and dead tissue which I think is why this is progressing towards a deeper realm as opposed to improving from my measurement standpoint in that regard. Nonetheless she has been tolerating the dressing changes she absolutely hates the memory foam mattress topper that was obtained for her nonetheless I do believe this is still doing excellent as far as taking care of excess pressure in regard to the lateral malleolus regions. She in fact has no pressure injury that I see whereas in weeks past it was week by week I was constantly seeing new pressure injuries. Overall I think it has been very beneficial for her. 07/03/18; patient arrives in my clinic today. She has deep punched out areas over her bilateral lateral malleoli. The area on the right has some more depth. We spent a lot of time today talking about pressure relief for these areas. This started when her daughter asked for a prescription for a memory foam mattress. I have never written a prescription for a mattress and I don't think insurances would pay for that on an ordinary bed. In any case he came up that she has foam boots that she refuses to wear. I would suggest going to these before any other offloading issues when she is in bed. They say she is meticulous about offloading this the rest of the day 07/10/18- She is seen in follow-up evaluation for bilateral, lateral malleolus ulcers. There is no improvement in the ulcers. She has purchased and is sleeping on a memory foam mattress/overlay, she has been using the offloading boots nightly over the past week. She has a  follow up appointment with vascular medicine at the end of October, in my opinion this follow up should be expedited given her deterioration and suboptimal TBI results. We will order plain film xray of the left ankle as deeper structures are palpable; would consider having MRI, regardless of xray report(s). The ulcers will be treated with iodoflex/iodosorb, she is unable to safely change the dressings daily with santyl. 07/19/18 on evaluation today patient appears to be doing in general visually well in regard to her bilateral lateral malleolus ulcers. She has been tolerating the dressing changes without complication which is good news. With that being said we did have an x-ray performed on 07/12/18 which revealed a slight loosen see in the lateral portion of the distal left fibula which may represent artifact but underline lytic destruction or osteomyelitis could not be excluded. MRI was recommended. With that being said we can see about getting the patient scheduled for an MRI to further evaluate this area. In fact we have that scheduled currently for August 20 19,019. 07/26/18 on evaluation today patient's wound on the right lateral ankle actually appears to be doing fairly well at this point in my pinion. She has made some good progress currently. With that being said unfortunately in regard to the left lateral ankle ulcer this seems to be a little bit more problematic at this time. In fact as I further evaluated the situation she actually had bone exposed which is the first time that's been the case in the bone appear to be necrotic. Currently I did review patient's note from Dr. Bunnie Domino office with [facility]. He stated that ABI  was 1.26 on the right and 0.95 on the left with good waveforms. Her perfusion is stable not reduced from previous studies and her digital waveforms were pretty good particularly on the right. His conclusion upon review of the note was that there was not  much she could do to improve her perfusion and he felt she was adequate for wound healing. His suggestion was that she continued to see [place] and consider a synthetic skin graft if there was no underlying infection. He plans to see her back in six months or as needed. 08/01/18 on evaluation today patient appears to be doing better in regard to her right lateral ankle ulcer. Her left lateral ankle ulcer is about the same she still has bone involvement in evidence of necrosis. There does not appear to be evidence of infection at this time On the right lateral lower extremity. I have started her on the Augmentin she picked this up and started this yesterday. This is to get her through until she sees infectious disease which is scheduled for 08/12/18. 08/06/18 on evaluation today patient appears to be doing rather well considering my discussion with patient's daughter at the end of last week. The area which was marked where she had erythema seems to be improved and this is good news. With that being said overall the patient seems to be making good improvement when it comes to the overall appearance of the right lateral ankle ulcer although this has been slow she at least is coming around in this regard. Unfortunately in regard to the left lateral ankle ulcer this is osteomyelitis based on the pathology report as well is bone culture. Nonetheless we are still waiting CT scan. Unfortunately the MRI we originally ordered cannot be performed as the patient is a pacemaker which I had overlooked. Nonetheless we are working on the CT scan approval and scheduling as of now. She did go to the hospital over the weekend and was placed on IV Cefzo for a couple of days. Fortunately this seems to have improved the erythema quite significantly which is good news. There does not appear to be any evidence of worsening infection at this time. She did have some bleeding after the last debridement therefore I did not perform any  sharp debridement in regard to left lateral ankle at this point. Patient has been approved for a snap vac for the right lateral ankle. 08/14/18; the patient with wounds over her bilateral lateral malleoli. The area on the right actually looks quite good. Been using a snap back on this area. Healthy granulation and appears to be filling in. Unfortunately the area on the left is really problematic. 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 Also noted to have cellulitis. She saw Dr. Novella Olive of infectious disease today and was put on linezolid. We are able to verify this with her pharmacy. She is completed the Augmentin that she was already on. We've been using Iodoflex to this area 08/23/18 on evaluation today patient's wounds both actually appear to be doing better compared to my prior evaluations. Fortunately she showing signs of good improvement in regard to the overall wound status especially where were using the snap vac on the right. In regard to left lateral malleolus the wound bed actually appears to be much cleaner than previously noted. I do not feel any phone directly probed during evaluation today and though there is tendon noted this does  not appear to be necrotic it's actually fairly good as far as the overall appearance of the tendon is concerned. In general the wound bed actually appears to be doing significantly better than it was previous. Patient is currently in the care of [name] and I did review that note today. He actually has her on two weeks of linezolid and then following the patient will be on 1-2 months of Keflex. That is the plan currently. She has been on antibiotics therapy as prescribed by myself initially starting on July 30, 2018 and has been on that continuously up to this point. 08/30/18 on evaluation today patient actually appears to be doing much better in regard to her right lateral  malleolus ulcer. She has been tolerating the dressing changes specifically the snap vac without complication although she did have some issues with the seal currently. Apparently there was some trouble with getting it to maintain over the past week past Sunday. Nonetheless overall the wound appears better in regard to the right lateral malleolus region. In regard to left lateral malleolus this actually show some signs of additional granulation although there still tendon noted in the base of the wound this appears to be healthy not necrotic in any way whatsoever. We are considering potentially using a snap vac for the left lateral malleolus as well the product wrap from [employer], [place], was present in the clinic today we're going to see this patient I did have her come in with me after obtaining consent from the patient and her daughter in order to look at the wound and see if there's any recommendation one way or another as to whether or not they felt the snapback could be beneficial for the left lateral malleolus region. But CORONA, POPOVICH ([id number]) the conclusion was that it might be but that this is definitely a little bit deeper wound than what traditionally would be utilized for a snap vac. 09/06/18 on evaluation today patient actually appears to be doing excellent in my pinion in regard to both ankle ulcers. She has been tolerating the dressing changes without complication which is great news. Specifically we have been using the snap vac. In regard to the right ankle I'm not even sure that this is going to be necessary for today and following as the wound has filled in quite nicely. In regard to the left ankle I do believe that we're seeing excellent epithelialization from the edge as well as granulation in the central portion the tendon is still exposed but there's no evidence of necrotic bone and in general I feel like the patient has made excellent progress even compared to last week with  just one week of the snap vac. 09/11/18; this is a patient who has wounds on her bilateral lateral malleoli. Initially both of these were deep stage IV wounds in the setting of chronic arterial insufficiency. She has been revascularized. As I understand think she been using snap vacs to both of these wounds however the area on the right became more superficial and currently she is only using it on the left. Using silver collagen on the right and silver collagen under the back on the left I believe 09/19/18 on evaluation today patient actually appears to be doing very well in regard to her lateral malleolus or ulcers bilaterally. She has been tolerating the dressing changes without complication. Fortunately there does not appear to be any evidence of infection at this time. Overall I feel like she is improving in an  excellent manner and I'm very pleased with the fact that everything seems to be turning towards the better for her. This has obviously been a long road. 09/27/18 on evaluation today patient actually appears to be doing very well in regard to her bilateral lateral malleolus ulcers. She has been tolerating the dressing changes without complication. Fortunately there does not appear to be any evidence of infection at this time which is also great news. No fevers, chills, nausea, or vomiting noted at this time. Overall I feel like she is doing excellent with the snap vac on the left malleolus. She had 40 mL of fluid collection over the past week. 10/04/18 on evaluation today patient actually appears to be doing well in regard to her bilateral lateral malleolus ulcers. She continues to tolerate the dressing changes without complication. One issue that I see is the snap vac on the left lateral malleolus which appears to have sealed off some fluid underlying this area and has not really allowed it to heal to the degree that I would like to see. For that reason I did suggest at this point we may  want to pack a small piece of packing strip into this region to allow it to more effectively wick out fluid. 10/11/18 in general the patient today does not feel that she has been doing very well. She's been a little bit lethargic and subsequently is having bodyaches as well according to what she tells me today. With that being said overall she has been concerned with the fact that something may be worsening although to be honest her wounds really have not been appearing poorly. She does have a new ulcer on her left heel unfortunately. This may be pressure related. Nonetheless it seems to me to have potentially started at least as a blister I do not see any evidence of deep tissue injury. In regard to the left ankle the snap vac still seems to be causing the ceiling off of the deeper part of the wound which is in turn trapping fluid. I'm not extremely pleased with the overall appearance as far as progress from last week to this week therefore I'm gonna discontinue the snap vac at this point. 10/18/18 patient unfortunately this point has not been feeling well for the past several days. She was seen by Grayland Ormond her primary care provider who is a Librarian, academic at [facility]. Subsequently she states that she's been very weak and generally feeling malaise. No fevers, chills, nausea, or vomiting noted at this time. With that being said bloodwork was performed at the PCP office on the 11th of this month which showed a white blood cell count of 10.7. This was repeated today and shows a white blood cell count of 12.4. This does show signs of worsening. Coupled with the fact that she is feeling worse and that her left ankle wound is not really showing signs of improvement I feel like this is an indication that the osteomyelitis is likely exacerbating not improving. Overall I think we may also want to check her C-reactive protein and sedimentation rate. Actually did call [facility] office  this afternoon while the patient was in the office here with me. Subsequently based on the findings we discussed treatment possibilities and I think that it is appropriate for [place] to go ahead and initiate treatment with doxycycline which I'm going to do. Subsequently he did agree to see about adding a CRP and sedimentation rate to her orders. If that has not  already been drawn to where they can run it they will contact the patient she can come back to have that check. They are in agreement with plan as far as the patient and her daughter are concerned. Nonetheless also think we need to get in touch with Dr. Henreitta Leber office to see about getting the patient scheduled with him as soon as possible. 11/08/18 on evaluation today patient presents for follow-up concerning her bilateral foot and ankle ulcers. I did do an extensive review of her chart in epic today. Subsequently she was seen by [name] he did initiate Cefepime IV antibiotic therapy. Subsequently she had some issues with her PICC line this had to be removed because it was coiled and then replaced. Fortunately that was now settled. Unfortunately she has continued have issues with her left heel as well as the issues that she is experiencing with her bilateral lateral malleolus regions. I do believe however both areas seem to be doing a little bit better on evaluation today which is good news. No fevers, chills, nausea, or vomiting noted at this time. She actually has an angiogram schedule with Dr. dew on this coming Monday, November 11, 2018. Subsequently the patient states that she is feeling much better especially than what she was roughly 2 weeks ago. She actually had to cancel an appointment because she was feeling so poorly. No fevers, chills, nausea, or vomiting noted at this time. 11/15/18 on evaluation today patient actually is status post having had her angiogram with Dr. dew Monday, four days ago. 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 He had to go and work on several areas of the vasculature fortunately he was able to obtain no more than a 30% residual stenosis throughout post procedure. I reviewed this note today. I think this will definitely help with healing at this time. Fortunately there does not appear to be any signs of infection and I do feel like ratio already has a better appearance to it. 11/22/18 upon evaluation today patient actually appears to be doing very well in regard to her wounds in general. The right lateral malleolus looks excellent the heel looks better in the left lateral malleolus also appears to be doing a little better. With that being said the right second toe actually appears to be open and training we been watching this is been dry and stable but now is open. 12/03/2018 Seen today for follow-up and management of multiple bilateral lower extremity wounds. New pressure injury of the great toe which is closed at this time. Wound of the right distal second toe appears larger today with deep undermining and a pocket of fluid present within the undermining region. Left and right malleolus is wounds are stable today with no signs and symptoms of infection.Denies any needs or concerns during exam today. 12/13/18 on evaluation today patient appears to be doing somewhat better in regard to her left heel ulcer. She also seems to be completely healed in regard to the right lateral malleolus ulcer. The left malleolus ulcer is smaller what unfortunately the wounds which are new over the first and second toes of the right foot are what are most concerning at this point especially the second. Both areas did require sharp debridement today. 12/20/18 on evaluation today patient's wound actually appears to be doing better in regard to left lateral ankle and her right lateral ankle continues to remain healed. The hill ulcer on the left is improved. She  does have improvement noted as well in  regard to both toe ulcers. Overall I'm very pleased in this regard. No fevers, chills, nausea, or vomiting noted at this time. [name], [name] ([id number]) 12/23/18 on evaluation today patient is seen after she had her toenails trimmed at the podiatrist office due to issues with her right great toe. There was what appeared to be dark eschar on the surface of the wound which had her in the podiatrist concerned. Nonetheless as I remember that during the last office visit I had utilize silver nitrate of this area I was much less concerned about the situation. Subsequently I was able to clean off much of this tissue without any complication today. This does not appear to show any signs of infection and actually look somewhat better compared to last time post debridement. Her second toe on the right foot actually had callous over and there did appear still be some fluid underneath this that would require debridement today. 12/27/18 on evaluation today patient actually appears to be showing signs of improvement at all locations. Even the left lateral ankle although this is not quite as great as the other sites. Fortunately there does not appear to be any signs of infection at this time and both of her toes on the right foot seem to be showing signs of improvement which is good news and very pleased in this regard. 01/03/19 on evaluation today patient appears to be doing better for the most part in regard to her wounds in particular. There does not appear to be any evidence of infection at this time which is good news. Fortunately there is no sign of really worsening anywhere except for the right great toe which she does have what appears to be a bruise/deep tissue injury which is very superficial and already resolving. I'm not sure where this came from I questioned her extensively and she does not recall what may have happened with this. Other than that the patient seems to be doing well even the left  lateral ankle ulcer looks good and is getting smaller. 01/10/19 on evaluation today patient appears to be doing well in regard to her left heel wound and both of her toe wounds. Overall I feel like there is definitely improvement here and I'm happy in that regard. With that being said unfortunately she is having issues with the left lateral malleolus ulcer which unfortunately still has a lot of depth to it. This is gonna be a very difficult wound for [place] to be able to truly get to heal. I may want to consider some type of skin substitute to see if this would be of benefit for her. I'll discuss this with her more the next visit most likely. This was something I thought about more at the end of the visit when I was Artie out of the room and the patient had been discharged. 01/17/19 on evaluation today patient appears to be doing very well in regard to her wounds in general. She's been making excellent progress at this time. Fortunately there's no sign of infection at this time either. No fevers, chills, nausea, or vomiting noted at this time. The biggest issue is still her left lateral malleolus where it appears to be doing well and is getting smaller but still shows a small corner where this is deeper and goes down into what appears to be the joint space. Nonetheless this is taking much longer to heal although it still looks better in smaller than previous  evaluations. 01/24/19 on evaluation today patient's wounds actually appear to be doing rather well in general overall. She did require some sharp debridement in regard to the right great toe but everything else appears to be doing excellent no debridement was even necessary. No fevers, chills, nausea, or vomiting noted at this time. 01/31/19 on evaluation today patient actually appears to be doing much better in regard to her left foot wound on the heel as well as the ankle. The right great toe appears to be a little bit worse today this had callous over  and trapped a lot of fluid underneath. Fortunately there's no signs of infection at any site which is great news. 02/07/19 on evaluation today patient actually appears to be doing decently well in regard to all of her ulcers at this point. No sharp debridement was required she is a little bit of hyper granulation in regard to the left lateral ankle as well as the left heel but the hill itself is almost completely healed which is excellent news. Overall been very pleased in this regard. 02/14/19 on evaluation today patient actually appears to be doing very well in regard to her ulcers on the right first toe, left lateral malleolus, and left heel. In fact the heel is almost completely healed at this point. The patient does not show any signs of infection which is good news. Overall very pleased with how things have progressed. 04/18/19 Telehealth Evaluation During the COVID-19 National Emergency: Verbal Consent: Obtained from patient Allergies: reviewed and the active list is current. Medication changes: patient has no current medication changes. COVID-19 Screening: 1. Have you traveled internationally or on a cruise ship in the last 14 dayso No 2. Have you had contact with someone with or under investigation for COVID-19o No 3. Have you had a fever, cough, sore throat, or experiencing shortness of breatho No on evaluation today actually did have a visit with this patient through a telehealth encounter with her home health nurse. Subsequently it was noted that the patient actually appears to be doing okay in regard to her wounds both the right great toe as well as the left lateral malleolus have shown signs of improvement although this in your theme around the left lateral malleolus there eschar coverings for both locations. The question is whether or not they are actually close and whether or not home health needs to discharge the patient or not. Nonetheless my concern is this point obviously is that  without actually seeing her and being able to evaluate this directly I cannot ensure that she is completely healed which is the question that I'm being asked. 04/22/19 on evaluation today patient presents for her first evaluation since last time I saw her which was actually February 14, 2019. I did do a telehealth visit last week in which point it was questionable whether or not she may be healed and had to bring her in today for confirmation. With that being said she does seem to be doing quite well at this point which is good news. There does not appear to be any drainage in the deed I believe her wounds may be healed. Readmission: 09/04/2019 on evaluation today patient appears to be doing unfortunately somewhat more poorly in regard to her left foot ulcer secondary to a wound that began on 08/21/2019 at least when she first noticed this. Fortunately she has not had any evidence of active infection at this time. Systemically. I also do not necessarily see any evidence of infection  at the blister/wound site on the first metatarsal head plantar aspect. This almost appears to be something that may have just rubbed inappropriately causing this to breakdown. They did not want a wait too long to come in to be seen as again she had significant issues in the past with wounds that took quite a while to heal in fact it was close to 2 years. Nonetheless this does not appear to be quite that bad but again we do need to remove some of the necrotic tissue from the surface of the wound to tell exactly the extent. She does not appear to have any significant arterial disease at this point and again her last ABIs and TBI's are recorded above in the alert section her left ABI was 1.27 with a TBI of 0.72 to the right ABI 1.08 with a TBI of 0.39. Other than this the patient has been doing quite FERN, CANOVA ([id number]) well since I last saw her and that was in May 2020. 09/11/2019 on evaluation today patient appeared  to be doing very well with regard to her plantar foot ulcer on the left. In fact this appears to be almost completely healed which is awesome. That is after just 1 week of intervention. With that being said there is no signs of active infection at this time. 09/18/2019 on evaluation today patient actually appears to be doing excellent in fact she is completely healed based on what I am seeing at this point. Fortunately there is no signs of active infection at this time and overall patient is very pleased to hear that this area has healed so quickly. Readmission: 05/13/2021 upon evaluation today this patient presents for reevaluation here in the clinic. This is a wound that actually we previously took care of. She had 1 on the right ankle and the left the left turned out to be be harder due to to heal but nonetheless is doing great at this point as the right that has reopened and it was noted first just several weeks ago with a scab over it and came off in just the past few days. Fortunately there does not appear to be any obvious evidence of significant active infection at this time which is great news. No fevers, chills, nausea, vomiting, or diarrhea. 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 There does not appear to be any signs of this interfering in any way with her wound. She does have swelling we previously had compression socks for her ordered but again it does not look like she wears these on a regular basis by any means. 05/26/2021 upon evaluation today patient appears to be doing well with regard to her wound which is actually showing signs of excellent improvement. There does not appear to be any signs of active infection which is great news and overall very pleased with where things stand today. No fevers, chills, nausea, vomiting, or diarrhea. 06/02/2021 upon evaluation today patient's wound actually showing signs of excellent improvement.  Fortunately there does not appear to be any signs of active infection which is great news. I think the patient is making good progress with regard to her wounds in general. 06/09/2021 upon evaluation today patient appears to be doing excellent in regard to her wounds currently. Fortunately there does not appear to be any signs of active infection which is great news. No fevers, chills, nausea, vomiting, or diarrhea. Overall extremely pleased with where things stand today. I  think the patient is making excellent progress. 06/16/2021 upon evaluation today patient appears to be doing well in regard to her wound. This is going require little bit of debridement today and that was discussed with the patient. Otherwise she seems to be doing quite well and I am actually very pleased with where things stand at this point. No fevers, chills, nausea, vomiting, or diarrhea. 06/23/2021 upon evaluation today patient appears to be doing well with regard to her wounds. She has been tolerating the dressing changes without complication. Fortunately there does not appear to be any evidence of infection and she has not had air in her home which she actually lives at an assisted living that got fixed this morning. With that being said because of that her wrap has been extremely hot and bothersome for her over the past week. 06/30/2021 upon evaluation today patient is actually making excellent progress in regard to her ankle ulcer. She has been tolerating the dressing changes without complication and overall extremely pleased with where things stand there does not appear to be any evidence of active infection which is great news. No fevers, chills, nausea, vomiting, or diarrhea. 07/07/21 upon evaluation today patients and culture on the right actually appears to be doing quite well. There does not appear to be any signs of infection and overall very pleased with where things stand today. No fevers, chills, nausea, or vomiting  noted at this time. 07/14/2021 unfortunately the patient today has some evidence of deep tissue injury and pressure getting to the ankle region. Again I am not exactly sure what is going on here but this is very similar to issues that we have had in the past. I explained to the patient that she needs to be very mindful of exactly what is happening I think sleeping in bed is probably the main issue here although there could be other culprits I am not sure what else would potentially lead to this kind of a problem for her. 07/21/2021 upon evaluation today patient's wound actually showing signs of improvement compared to last week. Fortunately there does not appear to be any signs of active infection which is great news and overall very pleased with where things stand in that regard. With that being said I do believe that she is continuing to show signs of overall of getting better although I think this is still basically about what we were 2 weeks ago due to the worsening and now improvement. 07/28/2021 upon evaluation today patient appears to be doing well with regard to her wound. She does have some slough buildup on the surface of the wound which I would have to manage today. Fortunately there is no sign of active infection at this time. No fevers, chills, nausea, vomiting, or diarrhea. 08/04/2021 upon evaluation today patient appears to be doing about the same in regard to her wound. To be perfectly honest I am beginning to be a little bit concerned about the overall appearance of the wound bed. I do think possibly taking a sample right around the margin of the wound could be beneficial for her as far as identifying anything such as an inflammatory process or to be honest even a skin tag cancer type process that may be of concern here. Fortunately there does not appear to be any evidence of active infection at this time which is great news she is not having any pain also great news. 08/11/2021 upon  evaluation today patient appears to be doing well with regard  to her wound. The good news is I did review her biopsy results and it showed some inflammatory mixed findings but nothing that appeared to be malignant which is great news. Overall this is more of a chronic venous stasis type issue which again is more what we have been treating. Nonetheless I just wanted to make sure before going forward that there was not anything more untoward going on at this point. 08/18/2021 upon evaluation today patient appears to be doing well with regard to her ankle ulcer. Fortunately there does not appear to be any signs of active infection at this time which is great overall wound is dramatically improved compared to last week. Since last week I have actually placed her on doxycycline and subsequently this is a good option as far as the findings are concerned at this point. 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 the doxycycline is doing that. There does not appear to be any evidence of active infection systemically which is great news. 08/25/2021 upon evaluation today patient appears to be doing well with regard to her wound. I feel like we are finally get back on track as far as healing is concerned I am much happier with the overall appearance today. I do think that she is tolerating the dressing changes without complication which is great news. We have been using Hydrofera Blue which I think is a good option. The good news is she is also doing great in [name], LEDORA DELKER. ([id number]) regard to her compression sock on the left which is a zipper compression that seems to be doing a great job keeping her edema under good control. 09/01/2021 upon evaluation today patient appears to be doing well with regard to her wound. Infection seems to be under much better control which is great news  and very pleased in that regard. Fortunately there does not appear to be any signs of infection currently. 09/08/2021 upon evaluation today patient actually appears to be making good progress in regard to her wound. She has been tolerating the dressing changes without complication. Fortunately there does not appear to be any evidence of active infection at this time which is great news as well. No fevers, chills, nausea, vomiting, or diarrhea. 09/22/2021 upon evaluation today patient appears to be doing well with regard to her wound although is very slow to heal. We have not looked into Apligraf yet I think that is something that we should see about doing. 09/29/2021 upon evaluation today patient's wound is actually showing signs of doing about the same. I am not seeing any evidence of worsening but also no significant evidence of improvement. We did gain approval for the organogenesis products all except for Apligraf as covered by her insurance. With that being said I do think that we can go ahead and proceed with the NuShield if the patient and her family in agreement of the plan I discussed that with him today she does have a 20% coinsurance which we also discussed. 10/06/2021 upon evaluation today patient appears to unfortunately be doing a little bit worse she appears to be infected based on what I am seeing. Fortunately there does not appear to be any signs of active infection at this time which is great news. Unfortunately it does appear to be some evidence of around the wound edge indicated by way of erythema and warmth  as well as redness 10/13/2021 upon evaluation today patient actually appears to be doing excellent in regard to her ankle ulcer compared to what it was. Fortunately though she does have evidence of infection, MRSA, on the culture which I did review this overall should be managed by the antibiotic that I given her which was the doxycycline and again today this seems to be doing  much better. I think were fine to go ahead and apply the NuShield today. 10/20/2021 upon evaluation today patient appears to be doing excellent in fact the NuShield seems to have done all some for her thus far. I am actually very pleased with where we stand and overall I think that she is making great progress. There is no evidence of active infection at this time. 11/23; patient presents for follow-up. She has no issues or complaints today. She denies signs of infection. She reports stability in her wound healing. 11/03/2021 upon evaluation today patient appears to be doing well with regard to her wounds. She has been tolerating the dressing changes without complication. Fortunately there is no signs of active infection at this time. 11/10/2021 upon evaluation today patient appears to be doing well with regard to her right ankle which is actually showing signs of improvement with the NuShield I am very pleased. Subsequently in regards to the left ankle this appears to be doing okay with no evidence of issue here either. With that being said she has a significant contusion on the left leg from having fallen 2 days ago. She tells me that she was using her walker going to the closet and then when she got to the closet turned around to get something out at which point she fell according to the story. Nonetheless she does have a hematoma just below her knee on the anterior portion of her shin. My hope is that this will not open until wound although we do need I think Some compression over it also think that we need to have her use an ice as well to help with the swelling and prevent this from getting worse. The last thing she needs is a wound opened up here. 11/17/2021 upon evaluation patient's right ankle actually showing signs of improvement based on what I see currently there is a lot of new skin growth coming in which is great news. Nonetheless I do feel like that the patient is showing improvement as  well in regard to her left leg with the use of the Tubigrip it swollen but not as bruised as what I would have expected after what I was seeing last week. Nonetheless I do think doubling up on the Tubigrip would probably be beneficial to try to keep some of the edema under better control here. 11/24/2021 upon evaluation today patient appears to be doing excellent in regard to her wound. This is actually measuring significantly smaller which is great news. Fortunately I do not see any signs of active infection locally nor systemically at this point. Overall I am very pleased with how the NuShield is doing. 12/30; wound bed looks healthy however not much change in overall wound volume. We applied Nushield again today in the standard fashion 12/08/2021 upon evaluation today patient's wound actually showing signs of good improvement and I am actually very pleased with where we stand today as well. I do not see any evidence of active infection locally nor systemically which is great news. Unfortunately she has been having some issues with stomach upset and diarrhea today. 12/15/2021  upon evaluation today patient appears to be doing excellent in regard to her wound. There is a little bit of dry skin raised up around the edges of the wound but fortunately nothing too significant at this point. Fortunately I do not see any evidence of active infection either which is great news. No fevers, chills, nausea, vomiting, or diarrhea. 12/29/2021 upon evaluation today patient appears to be doing well with regard to her wound. In general I feel like she is getting very close to complete resolution. I think that she is making good progress here as well. Overall I do not see any signs of infection and very little of this appears to actually be open. 01/12/2022 upon evaluation today patient appears to be doing a little bit worse in regard to her wound. It appears that the collagen actually trapped fluid underneath the  collagen which got hard and subsequently caused the fluid to back up. This patient has made the wound appear to be larger than what it was previous. With that being said I do not see any signs of obvious infection is not warm to touch and not significantly erythematous either which is good news. Nonetheless I do think that we will need to continue to keep an eye on this. Probably not go put on antibiotic this week but I will be too far from doing so she is actually on a Z-Pak right now I do not want to really double up on antibiotics. 01/26/2022 upon evaluation today patient's wound is showing signs of improvement although this is slow to turn back around. Fortunately there does not appear to be any evidence of active infection locally or systemically which is great news and overall I am extremely pleased with where we stand today. No fevers, chills, nausea, vomiting, or diarrhea. 02/02/2022 upon evaluation today patient appears to be doing well with regard to her wound. She has been tolerating the dressing changes without complication. Fortunately there does not appear to be any signs of active infection locally nor systemically at this time which is great news. No fevers, chills, nausea, vomiting, or diarrhea. [name], [name] ([id number]) 3/9; patient presents for follow-up. She has no issues or complaints today. She denies signs of infection. 02/16/2022 upon evaluation today patient appears to be doing well with regard to her wound. She has been tolerating the dressing changes without complication. Fortunately I do not see any evidence of active infection locally nor systemically at this point which is great news. Overall I think that the patient is making excellent progress in general. The wound is measuring smaller and though there does appear to be some need for sharp debridement today this is minimal compared to what we have noted previous. 02/23/2022 upon evaluation today patient appears to be  doing well with regard to her wound she is definitely showing signs of improvement which is great news. 03/02/2022 upon evaluation today patient appears to be doing about the same in regard to her wound. Unfortunately were not seeing significant improvement. With that being said is also not significantly worse which is great news. No fevers, chills, nausea, vomiting, or diarrhea. 03-09-2022 upon evaluation today patient appears to be doing well with regard to her wound. She has been tolerating the dressing changes without complication. Fortunately there does not appear to be any signs of active infection locally or systemically which is great news. 03-16-2022 upon evaluation today patient appears to be doing well with regard to her wound this is measuring smaller and looking  much better. I am actually very pleased with where we stand and I think that the patient is making great progress. I do not see any evidence of active infection locally or systemically which is great news. No fevers, chills, nausea, vomiting, or diarrhea. 03-23-2022 upon evaluation today patient appears to be doing better in regard to her wounds. In fact the wound area actually is showing signs of excellent improvement and actually very pleased with where we stand today. I do not see any evidence of active infection locally nor systemically which is great news. 4/27; comes in today with again thick callus around the wound circumference which I removed with a curette. Some debris on the surface. Overall I do not think quite as good as it was last week by description. Using endoform 04-06-2022 upon evaluation today patient appears to be doing pretty well in regard to her wound. Fortunately I do not see any evidence of active infection locally or systemically which is great news and overall I am pleased in that regard. I do feel like this is measuring smaller postdebridement compared to where we were previous. Overall she is headed in the  right direction. She does have an area that is threatening to open and looks a little irritated on the left ankle we did measure this just for the discolored area for now its not draining too much but we want to monitor and make sure nothing worsens here. 04-13-2022 upon evaluation today patient appears to be doing better in regard to her wound although this is still very slow to heal. We have reapplied for a skin substitute but have not heard anything back as of yet. Fortunately I do not see any evidence of active infection locally or systemically at this time which is great news. 04-20-2022 upon evaluation today patient's wound is actually showing signs of significant improvement which is great news. We did get approval for skin substitutes which I think is an option here for [place] but at the same time I am not even certain that this is going to be necessary especially considering how well things seem to be doing at this point. If she continues healing as we see her right now but I think were probably going to be able to avoid any need for additional skin substitute. Patient and her daughter are both in agreement with that plan. With that being said she did have a fall she has several areas of contusion fortunately nothing that is can require intervention at this point but nonetheless she did also fracture her rib which is extremely uncomfortable obviously. Objective Constitutional Well-nourished and well-hydrated in no acute distress. Vitals Time Taken: 10:27 AM, Height: 62 in, Weight: 150 lbs, BMI: 27.4, Temperature: 98.1 F, Pulse: 66 bpm, Respiratory Rate: 18 breaths/min, Blood Pressure: 160/84 mmHg. Respiratory normal breathing without difficulty. Psychiatric this patient is able to make decisions and demonstrates good insight into disease process. Alert and Oriented x 3. pleasant and cooperative. General Notes: Upon inspection patient's wound bed actually showed signs of good granulation  nebulization at this point. Fortunately there does not appear to be any evidence of infection locally or systemically which is great news and overall I am very pleased I do feel like the wound is very close to complete closure on the right. Integumentary (Hair, Skin) Wound #7 status is Open. Original cause of wound was Gradually Appeared. The date acquired was: 05/12/2021. The wound has been in treatment 48 weeks. The wound is located on the Right,Lateral  Malleolus. The wound measures 0.5cm length x 0.5cm width x 0.1cm depth; 0.196cm^2 area and 0.02cm^3 volume. There is Fat Layer (Subcutaneous Tissue) exposed. There is no tunneling or undermining noted. There is a medium amount of serosanguineous drainage noted. There is medium (34-66%) red granulation within the wound bed. There is a medium (34-66%) Patriarca, [name] ([id number]) amount of necrotic tissue within the wound bed. Wound #8 status is Open. Original cause of wound was Gradually Appeared. The date acquired was: 04/03/2022. The wound has been in treatment 2 weeks. The wound is located on the Left,Lateral Ankle. The wound measures 0cm length x 0cm width x 0cm depth; 0cm^2 area and 0cm^3 volume. There is no tunneling or undermining noted. There is a none present amount of drainage noted. There is no granulation within the wound bed. There is no necrotic tissue within the wound bed. 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 There was a Three Layer Compression Therapy Procedure by Carlene Coria, RN. Post procedure Diagnosis Wound #8: Same as Pre-Procedure Plan Follow-up Appointments: Return Appointment in 1 week. Bathing/ Shower/  Hygiene: May shower with wound dressing protected with water repellent cover or cast protector. Edema Control - Lymphedema / Segmental Compressive Device / Other: Optional: One layer of unna paste to top of compression wrap (to act as an anchor). - and at toes Patient to wear own compression stockings. Remove compression stockings every night before going to bed and put on every morning when getting up. - left leg Elevate, Exercise Daily and Avoid Standing for Long Periods of Time. Elevate legs to the level of the heart and pump ankles as often as possible Elevate leg(s) parallel to the floor when sitting. WOUND #7: - Malleolus Wound Laterality: Right, Lateral Cleanser: Soap and Water 1 x Per Week/30 Days Discharge Instructions: Gently cleanse wound with antibacterial soap, rinse and pat dry prior to dressing wounds Primary Dressing: Endoform 2x2 (in/in) 1 x Per Week/30 Days Discharge Instructions: Apply Endoform as directed Secondary Dressing: Gauze 1 x Per Week/30 Days Discharge Instructions: As directed: dry, moistened with saline or moistened with Dakins Solution Secondary Dressing: mepitil one 1 x Per Week/30 Days Compression Wrap: 3-LAYER WRAP - Profore Lite LF 3 Multilayer Compression Bandaging System 1 x Per Week/30 Days Discharge Instructions: Apply 3 multi-layer wrap as prescribed. WOUND #8: - Ankle Wound Laterality: Left, Lateral Primary Dressing: (BORDER) Zetuvit Plus Silicone Border Dressing 4x4 (in/in) 1. I am good recommend that we going continue with the wound care measures as before and the patient is in agreement with that plan. This includes the use of the endoform which I do believe is doing well. 2. We will continue with the Mepitel over top of this to keep it from drying out. 3. I am also can recommend a 3 layer compression wrap be continued which I do feel like he is doing well. We will see patient back for reevaluation in 1 week here in the clinic. If anything  worsens or changes patient will contact our office for additional recommendations. [name], [name] ([id number]) Electronic Signature(s) Signed: 04/20/2022 2:14:39 PM By: Worthy Keeler PA-C Entered By: Worthy Keeler on 04/20/2022 14:14:38 Carlton, [name] ([id number]) -------------------------------------------------------------------------------- SuperBill Details Patient Name: [name] Date of Service: 04/20/2022  Medical Record Number: [id number] Patient Account Number: [id number] Date of Birth/Sex: 08/17/1923 (88 y.o. F) Treating RN: Carlene Coria Primary Care Provider: Adrian Prows Other Clinician: Referring Provider: Adrian Prows Treating Provider/Extender: Skipper Cliche in Treatment: 48 Diagnosis Coding ICD-10 Codes Code Description [phone] Pressure ulcer of right ankle, stage 3 I89.0 Lymphedema, not elsewhere classified I87.2 Venous insufficiency (chronic) (peripheral) Z95.0 Presence of cardiac pacemaker Z79.01 Long term (current) use of anticoagulants Facility Procedures CPT4 Code: [id number] Description: (Facility Use Only) [phone] - [place] L[facility] RT LEG Modifier: Quantity: 1 Physician Procedures CPT4 Code: [id number] Description: 58592 - WC PHYS LEVEL 3 - EST PT Modifier: Quantity: 1 CPT4 Code: Description: ICD-10 Diagnosis Description 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, stage 3 I89.0 Lymphedema, not elsewhere classified I87.2 Venous insufficiency (chronic) (peripheral) Z95.0 Presence of cardiac pacemaker Modifier: Quantity: Electronic Signature(s) Signed: 04/20/2022 2:23:58 PM By: Worthy Keeler PA-C Entered By: Worthy Keeler on 04/20/2022 14:23:58

## 2022-04-20 NOTE — Progress Notes (Addendum)
[name], [name] ([id number]) Visit Report for 04/20/2022 Arrival Information Details Patient Name: [name], [name] Date of Service: 04/20/2022 10:15 AM Medical Record Number: [id number] Patient Account Number: [id number] Date of Birth/Sex: 1923-11-30 (88 y.o. F) Treating RN: Carlene Coria Primary Care Jehieli Brassell: Adrian Prows Other Clinician: Referring Carrisa Keller: Adrian Prows Treating Tranesha Lessner/Extender: Skipper Cliche in Treatment: 94 Visit Information History Since Last Visit All ordered tests and consults were completed: No Patient Arrived: Ambulatory Added or deleted any medications: No Arrival Time: 10:15 Any new allergies or adverse reactions: No Accompanied By: daughter Had a fall or experienced change in No Transfer Assistance: None activities of daily living that may affect Patient Identification Verified: Yes risk of falls: Secondary Verification Process Completed: Yes Signs or symptoms of abuse/neglect since last visito No Patient Requires Transmission-Based No Hospitalized since last visit: No Precautions: Implantable device outside of the clinic excluding No Patient Has Alerts: Yes cellular tissue based products placed in the center Patient Alerts: Patient on Blood since last visit: Thinner Has Dressing in Place as Prescribed: Yes NOT DIABETIC Pain Present Now: No ***ELIQUIS*** Electronic Signature(s) Signed: 04/20/2022 4:24:09 PM By: Carlene Coria RN Entered By: Carlene Coria on 04/20/2022 10:27:19 Diclemente, [name] ([id number]) -------------------------------------------------------------------------------- Clinic Level of Care Assessment Details Patient Name: [name] Date of Service: 04/20/2022 10:15 AM Medical Record Number: [id number] Patient Account Number: [id number] Date of Birth/Sex: 02/24/1923 (88 y.o. F) Treating RN: Carlene Coria Primary Care Tito Ausmus: Adrian Prows Other Clinician: Referring Verdene Creson: Adrian Prows Treating Cade Dashner/Extender: Skipper Cliche in Treatment: 48 Clinic Level of Care Assessment Items TOOL 1 Quantity Score '[]'  - Use when EandM and Procedure is performed on INITIAL visit 0 ASSESSMENTS - Nursing Assessment / Reassessment '[]'  - General Physical Exam (combine w/ comprehensive assessment (listed just below) when performed on new 0 pt. evals) '[]'  - 0 Comprehensive Assessment (HX, ROS, Risk Assessments, Wounds Hx, etc.) ASSESSMENTS - Wound and Skin Assessment / Reassessment '[]'  - Dermatologic / Skin Assessment (not related to wound area) 0 ASSESSMENTS - Ostomy and/or Continence Assessment and Care '[]'  - Incontinence Assessment and Management 0 '[]'  - 0 Ostomy Care Assessment and Management (repouching, etc.) PROCESS - Coordination of Care '[]'  - Simple Patient / Family Education for ongoing care 0 '[]'  - 0 Complex (extensive) Patient / Family Education for ongoing care '[]'  - 0 Staff obtains Programmer, systems, Records, Test Results / Process Orders '[]'  - 0 Staff telephones HHA, Nursing Homes / Clarify orders / etc '[]'  - 0 Routine Transfer to another Facility (non-emergent condition) '[]'  - 0 Routine Hospital Admission (non-emergent condition) '[]'  - 0 New Admissions / Biomedical engineer / Ordering NPWT, Apligraf, etc. '[]'  - 0 Emergency Hospital Admission (emergent condition) PROCESS - Special Needs '[]'  - Pediatric / Minor Patient Management 0 '[]'  - 0 Isolation Patient Management '[]'  - 0 Hearing / Language / Visual special needs '[]'  - 0 Assessment of Community assistance (transportation, D/C planning, etc.) '[]'  - 0 Additional assistance / Altered mentation '[]'  - 0 Support Surface(s) Assessment (bed, cushion, seat, etc.) INTERVENTIONS - Miscellaneous '[]'  - External ear exam 0 '[]'  - 0 Patient Transfer (multiple staff / Civil Service fast streamer / Similar devices) '[]'  - 0 Simple Staple / Suture removal (25 or less) '[]'  - 0 Complex Staple / Suture removal (26 or more) '[]'  - 0 Hypo/Hyperglycemic  Management (do not check if billed separately) '[]'  - 0 Ankle / Brachial Index (ABI) - do not check if billed separately Has the patient been seen at the hospital within  the last three years: Yes Total Score: 0 Level Of Care: ____ [name] ([id number]) Electronic Signature(s) Signed: 04/20/2022 4:24:09 PM By: Carlene Coria RN Entered By: Carlene Coria on 04/20/2022 10:46:15 Brennen, [name] ([id number]) -------------------------------------------------------------------------------- Compression Therapy Details Patient Name: [name] Date of Service: 04/20/2022 10:15 AM Medical Record Number: [id number] Patient Account Number: [id number] Date of Birth/Sex: Jul 14, 1923 (88 y.o. F) Treating RN: Carlene Coria Primary Care Salwa Bai: Adrian Prows Other Clinician: Referring Yarel Kilcrease: Adrian Prows Treating Jadae Steinke/Extender: Skipper Cliche in Treatment: 48 Compression Therapy Performed for Wound Assessment: Wound #8 Left,Lateral Ankle Performed By: Clinician Carlene Coria, RN Compression Type: Three Layer Post Procedure Diagnosis Same as Pre-procedure Electronic Signature(s) Signed: 04/20/2022 4:24:09 PM By: Carlene Coria RN Entered By: Carlene Coria on 04/20/2022 10:45:46 Strothman, [name] ([id number]) -------------------------------------------------------------------------------- Encounter Discharge Information Details Patient Name: [name] Date of Service: 04/20/2022 10:15 AM Medical Record Number: [id number] Patient Account Number: [id number] Date of Birth/Sex: 09-09-1923 (88 y.o. F) Treating RN: Carlene Coria Primary Care Joenathan Sakuma: Adrian Prows Other Clinician: Referring Jadie Comas: Adrian Prows Treating Janace Decker/Extender: Skipper Cliche in Treatment: 2 Encounter Discharge Information Items Discharge Condition: Stable Ambulatory Status: Walker Discharge Destination: Home Transportation: Private Auto Accompanied By: self Schedule  Follow-up Appointment: Yes Clinical Summary of Care: Patient Declined Electronic Signature(s) Signed: 04/20/2022 4:24:09 PM By: Carlene Coria RN Entered By: Carlene Coria on 04/20/2022 10:47:56 Deboard, [name] ([id number]) -------------------------------------------------------------------------------- Lower Extremity Assessment Details Patient Name: [name] Date of Service: 04/20/2022 10:15 AM Medical Record Number: [id number] Patient Account Number: [id number] Date of Birth/Sex: 06/27/1923 (88 y.o. F) Treating RN: Carlene Coria Primary Care Javohn Basey: Adrian Prows Other Clinician: Referring Ellyssa Zagal: Adrian Prows Treating Maziah Smola/Extender: Skipper Cliche in Treatment: 48 Edema Assessment Assessed: [Left: No] [Right: No] Edema: [Left: Ye] [Right: s] Calf Left: Right: Point of Measurement: 33 cm From Medial Instep 28 cm Ankle Left: Right: Point of Measurement: 10 cm From Medial Instep 17 cm Vascular Assessment Pulses: Dorsalis Pedis Palpable: [Right:Yes] Electronic Signature(s) Signed: 04/20/2022 4:24:09 PM By: Carlene Coria RN Entered By: Carlene Coria on 04/20/2022 10:39:18 Hoshino, [name] ([id number]) -------------------------------------------------------------------------------- Multi Wound Chart Details Patient Name: [name] Date of Service: 04/20/2022 10:15 AM Medical Record Number: [id number] Patient Account Number: [id number] Date of Birth/Sex: 11-23-1923 (88 y.o. 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/A Comorbid History: Cataracts, Congestive Heart Failure, Cataracts, Congestive Heart Failure, N/A Hypertension, Peripheral Arterial Hypertension, Peripheral Arterial Disease, Osteoarthritis, Neuropathy Disease, Osteoarthritis, Neuropathy Date Acquired: 05/12/2021 04/03/2022 N/A Weeks of Treatment: 48 2 N/A Wound Status: Open Open N/A Wound Recurrence: No No N/A Measurements L x W x D (cm) 0.5x0.5x0.1 0x0x0 N/A Area (cm) : 0.196 0 N/A Volume (cm) : 0.02 0 N/A % Reduction in Area: 82.50% 100.00% N/A % Reduction in Volume: 91.10% 100.00% N/A Classification: Category/Stage III Unclassifiable N/A Exudate Amount: Medium None Present N/A Exudate Type: Serosanguineous N/A N/A Exudate Color: red, brown N/A N/A Granulation Amount: Medium (34-66%) None Present (0%) N/A Granulation Quality: Red N/A N/A Necrotic Amount: Medium (34-66%) None Present (0%) N/A Exposed Structures: Fat Layer (Subcutaneous Tissue): Fascia: No N/A Yes Fat Layer (  Subcutaneous Tissue): Fascia: No No Tendon: No Tendon: No Muscle: No Muscle: No Joint: No Joint: No Bone: No Bone: No Epithelialization: None None N/A Treatment Notes Electronic Signature(s) Signed: 04/20/2022 4:24:09 PM By: Carlene Coria RN Entered By: Carlene Coria on 04/20/2022 10:39:47 Mattia, [name] ([id number]) -------------------------------------------------------------------------------- [place] Details Patient Name: [name] Date of Service: 04/20/2022 10:15 AM Medical Record Number: [id number] Patient Account Number: [id number] Date of Birth/Sex: 1923/03/29 (88 y.o. F) Treating RN: Carlene Coria Primary Care Quinne Pires: Adrian Prows Other Clinician: Referring Lukasz Rogus: Adrian Prows Treating Kenton Fortin/Extender: Skipper Cliche in Treatment: 48 Active Inactive Pressure Nursing  Diagnoses: Knowledge deficit related to causes and risk factors for pressure ulcer development Knowledge deficit related to management of pressures ulcers Potential for impaired tissue integrity related to pressure, friction, moisture, and shear Goals: Patient will remain free from development of additional pressure ulcers Date Initiated: 04/06/2022 Date Inactivated: 04/13/2022 Target Resolution Date: 04/06/2022 Goal Status: Met Patient/caregiver will verbalize risk factors for pressure ulcer development Date Initiated: 04/06/2022 Target Resolution Date: 04/06/2022 Goal Status: Active Patient/caregiver will verbalize understanding of pressure ulcer management Date Initiated: 04/06/2022 Target Resolution Date: 04/06/2022 Goal Status: Active Interventions: Assess: immobility, friction, shearing, incontinence upon admission and as needed Assess offloading mechanisms upon admission and as needed Assess potential for pressure ulcer upon admission and as needed Notes: Electronic Signature(s) Signed: 04/20/2022 4:24:09 PM By: Carlene Coria RN Entered By: Carlene Coria on 04/20/2022 10:39:35 Jost, [name] ([id number]) -------------------------------------------------------------------------------- Pain Assessment Details Patient Name: [name] Date of Service: 04/20/2022 10:15 AM Medical Record Number: [id number] Patient Account Number: [id number] Date of Birth/Sex: [birth year] (88 y.o. F) Treating RN: Carlene Coria Primary Care Layn Kye: Adrian Prows Other Clinician: Referring Charo Philipp: Adrian Prows Treating Georgine Wiltse/Extender: Skipper Cliche in Treatment: 48 Active Problems Location of Pain Severity and Description of Pain Patient Has Paino No Site Locations Pain Management and Medication Current Pain Management: Electronic Signature(s) Signed: 04/20/2022 4:24:09 PM By: Carlene Coria RN Entered By: Carlene Coria on 04/20/2022 10:27:50 Yearwood, [name]  ([id number]) -------------------------------------------------------------------------------- Patient/Caregiver Education Details Patient Name: [name] Date of Service: 04/20/2022 10:15 AM Medical Record Number: [id number] Patient Account Number: [id number] Date of Birth/Gender: 1922-12-16 (88 y.o. F) Treating RN: Carlene Coria Primary Care Physician: Adrian Prows Other Clinician: Referring Physician: Adrian Prows Treating Physician/Extender: Skipper Cliche in Treatment: 23 Education Assessment Education Provided To: Patient Education Topics Provided Wound/Skin Impairment: Methods: Explain/Verbal Responses: State content correctly Electronic Signature(s) Signed: 04/20/2022 4:24:09 PM By: Carlene Coria RN Entered By: Carlene Coria on 04/20/2022 10:46:46 Mohon, [name] ([id number]) -------------------------------------------------------------------------------- Wound Assessment Details Patient Name: [name] Date of Service: 04/20/2022 10:15 AM Medical Record Number: [id number] Patient Account Number: [id number] Date of Birth/Sex: 1923-07-11 (88 y.o. F) Treating RN: Carlene Coria Primary Care Samanth Mirkin: Adrian Prows Other Clinician: Referring Lakechia Nay: Adrian Prows Treating Anaisa Radi/Extender: Skipper Cliche in Treatment: 48 Wound Status Wound Number: 7 Primary Pressure Ulcer Etiology: Wound Location: Right, Lateral Malleolus Wound Open Wounding Event: Gradually Appeared Status: Date Acquired: 05/12/2021 Comorbid Cataracts, Congestive Heart Failure, Hypertension, Weeks Of Treatment: 48 History: Peripheral Arterial Disease, Osteoarthritis, Neuropathy Clustered Wound: No Photos Wound Measurements Length: (cm) 0.5 Width: (cm) 0.5 Depth: (cm) 0.1 Area: (cm) 0.196 Volume: (cm) 0.02 % Reduction in Area: 82.5% % Reduction in Volume: 91.1% Epithelialization: None Tunneling: No Undermining: No Wound Description Classification:  Category/Stage III Exudate Amount: Medium Exudate Type: Serosanguineous Exudate Color: red, brown Foul Odor After Cleansing: No Slough/Fibrino Yes Wound Bed Granulation Amount: Medium (34-66%)  Exposed Structure Granulation Quality: Red Fascia Exposed: No Necrotic Amount: Medium (34-66%) Fat Layer (Subcutaneous Tissue) Exposed: Yes Tendon Exposed: No Muscle Exposed: No Joint Exposed: No Bone Exposed: No Treatment Notes Wound #7 (Malleolus) Wound Laterality: Right, Lateral Cleanser Soap and Water Discharge Instruction: Gently cleanse wound with antibacterial soap, rinse and pat dry prior to dressing wounds Peri-Wound Care Arenz, [name] ([id number]) Topical Primary Dressing Endoform 2x2 (in/in) Discharge Instruction: Apply Endoform as directed Secondary Dressing Gauze Discharge Instruction: As directed: dry, moistened with saline or moistened with Dakins Solution mepitil one Secured With Compression Wrap 3-LAYER WRAP - Profore Lite LF 3 Multilayer Compression Nolan Discharge Instruction: Apply 3 multi-layer wrap as prescribed. Compression Stockings Add-Ons Electronic Signature(s) Signed: 04/20/2022 4:24:09 PM By: Carlene Coria RN Entered By: Carlene Coria on 04/20/2022 10:38:14 Dimino, [name] ([id number]) -------------------------------------------------------------------------------- Wound Assessment Details Patient Name: [name] Date of Service: 04/20/2022 10:15 AM Medical Record Number: [id number] Patient Account Number: [id number] Date of Birth/Sex: [birth year] (88 y.o. F) Treating RN: Carlene Coria Primary Care Zavia Pullen: Adrian Prows Other Clinician: Referring Tyce Delcid: Adrian Prows Treating Ruie Sendejo/Extender: Skipper Cliche in Treatment: 63 Wound Status Wound Number: 8 Primary Arterial Insufficiency Ulcer Etiology: Wound Location: Left, Lateral Ankle Wound Open Wounding Event: Gradually Appeared Status: Date Acquired:  04/03/2022 Comorbid Cataracts, Congestive Heart Failure, Hypertension, Weeks Of Treatment: 2 History: Peripheral Arterial Disease, Osteoarthritis, Neuropathy Clustered Wound: No Wound Measurements Length: (cm) 0 Width: (cm) 0 Depth: (cm) 0 Area: (cm) 0 Volume: (cm) 0 % Reduction in Area: 100% % Reduction in Volume: 100% Epithelialization: None Tunneling: No Undermining: No Wound Description Classification: Unclassifiable Exudate Amount: None Present Foul Odor After Cleansing: No Slough/Fibrino No Wound Bed Granulation Amount: None Present (0%) Exposed Structure Necrotic Amount: None Present (0%) Fascia Exposed: No Fat Layer (Subcutaneous Tissue) Exposed: No Tendon Exposed: No Muscle Exposed: No Joint Exposed: No Bone Exposed: No Treatment Notes Wound #8 (Ankle) Wound Laterality: Left, Lateral Cleanser Peri-Wound Care Topical Primary Dressing (BORDER) Zetuvit Plus Silicone Border Dressing 4x4 (in/in) Secondary Dressing Secured With Compression Wrap Compression Stockings Add-Ons Electronic Signature(s) Signed: 04/20/2022 4:24:09 PM By: Carlene Coria RN Entered By: Carlene Coria on 04/20/2022 10:38:58 Karman, [name] ([id number]) -------------------------------------------------------------------------------- Vitals Details Patient Name: [name] Date of Service: 04/20/2022 10:15 AM Medical Record Number: [id number] Patient Account Number: [id number] Date of Birth/Sex: [birth year] (88 y.o. F) Treating RN: Carlene Coria Primary Care Kayonna Lawniczak: Adrian Prows Other Clinician: Referring Chia Rock: Adrian Prows Treating Nikhita Mentzel/Extender: Skipper Cliche in Treatment: 48 Vital Signs Time Taken: 10:27 Temperature (F): 98.1 Height (in): 62 Pulse (bpm): 66 Weight (lbs): 150 Respiratory Rate (breaths/min): 18 Body Mass Index (BMI): 27.4 Blood Pressure (mmHg): 160/84 Reference Range: 80 - 120 mg / dl Electronic Signature(s) Signed: 04/20/2022  4:24:09 PM By: Carlene Coria RN Entered By: Carlene Coria on 04/20/2022 10:27:41

## 2022-04-27 ENCOUNTER — Encounter: Payer: Medicare Other | Admitting: Physician Assistant

## 2022-04-27 DIAGNOSIS — L89513 Pressure ulcer of right ankle, stage 3: Secondary | ICD-10-CM | POA: Diagnosis not present

## 2022-04-27 NOTE — Progress Notes (Addendum)
[name], [name] ([id number]) Visit Report for 04/27/2022 Chief Complaint Document Details Patient Name: [name], [name]. Date of Service: 04/27/2022 10:15 AM Medical Record Number: [id number] Patient Account Number: [id number] Date of Birth/Sex: 09/11/1923 (88 y.o. F) Treating RN: Carlene Coria Primary Care Provider: Adrian Prows Other Clinician: Referring Provider: Adrian Prows Treating Provider/Extender: Skipper Cliche in Treatment: 37 Information Obtained from: Patient Chief Complaint Right foot ulcer Electronic Signature(s) Signed: 04/27/2022 10:11:02 AM By: Worthy Keeler PA-C Entered By: Worthy Keeler on 04/27/2022 10:11:02 Bigford, [name] ([id number]) -------------------------------------------------------------------------------- HPI Details Patient Name: [name] Date of Service: 04/27/2022 10:15 AM Medical Record Number: [id number] Patient Account Number: [id number] Date of Birth/Sex: [birth year] (88 y.o. 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 Dr. Amalia Hailey the podiatrist saw her on 07/20/2017 and sent her to the wound center for possible hyperbaric oxygen therapy. past medical history of peripheral vascular disease, varicose veins, status post appendectomy, basal cell carcinoma excision from the left leg, cholecystectomy, pacemaker placement, right lower extremity angiography done by Dr. dew in March 2017 with placement of a stent. there is also note of a successful ablation of the right small saphenous vein done which was reviewed by ultrasound on 10/24/2016. the  patient had a right small saphenous vein ablation done on 10/20/2016. The patient has never been a smoker. She has been seen by Dr. Corene Cornea dew the vascular surgeon who most recently saw her on 06/15/2017 for evaluation of ongoing problems with right leg swelling. She had a lower extremity arterial duplex examination done(02/13/17) which showed patent distal right superficial femoral artery stent and above-the-knee popliteal stent without evidence of restenosis. The ABI was more than 1.3 on the right and more than 1.3 on the left. This was consistent with noncompressible arteries due to medial calcification. The right great toe pressure and PPG waveforms are within normal limits and the left great toe pressure and PPG waveforms are decreased. he recommended she continue to wear her compression stockings and continue with elevation. She is scheduled to have a noninvasive arterial study in the near future 08/16/2017 -- had a lower extremity arterial duplex examination done which showed patent distal right superficial femoral artery stent and above-the-knee popliteal stent without evidence of restenosis. The ABI was more than 1.3 on the right and more than 1.3 on the left. This was consistent with noncompressible arteries due to medial calcification. The right great toe pressure and PPG waveforms are within normal limits and the left great toe pressure and PPG waveforms are decreased. the x-ray of the right ankle has not yet been done 08/24/2017 -- had a right ankle x-ray -- IMPRESSION:1. No fracture, bone lesion or evidence of osteomyelitis. 2. Lateral soft tissue swelling with a soft tissue ulcer. she has not yet seen the vascular surgeon for review 08/31/17 on evaluation today patient's wound appears to be showing signs of improvement. She still with her appointment with vascular in order to review her results of her vascular study and then determine if any intervention would be recommended at that  time. No fevers, chills, nausea, or vomiting noted at this time. She has been tolerating the dressing changes without complication. 09/28/17 on evaluation today patient's  wound appears to show signs of good improvement in regard to the granulation tissue which is surfacing. There is still a layer of slough covering the wound and the posterior portion is still significantly deeper than the anterior nonetheless there has been some good sign of things moving towards the better. She is going to go back to Dr. dew for reevaluation to ensure her blood flow is still appropriate. That will be before her next evaluation with [place] next week. No fevers, chills, nausea, or vomiting noted at this time. Patient does have some discomfort rated to be a 3-4/10 depending on activity specifically cleansing the wound makes it worse. 10/05/2017 -- the patient was seen by Dr. Lucky Cowboy last week and noninvasive studies showed a normal right ABI with brisk triphasic waveforms consistent with no arterial insufficiency including normal digital pressures. The duplex showed a patent distal right SFA stent and the proximal SFA was also normal. He was pleased with her test and thought she should have enough of perfusion for normal wound healing. He would see her back in 6 months time. 12/21/17 on evaluation today patient appears to be doing fairly well in regard to her right lateral ankle wound. Unfortunately the main issue that she is expansion at this point is that she is having some issues with what appears to be some cellulitis in the right anterior shin. She has also been noting a little bit of uncomfortable feeling especially last night and her ankle area. I'm afraid that she made the developing a little bit of an infection. With that being said I think it is in the early stages. 12/28/17 on evaluation today patient's ankle appears to be doing excellent. She's making good progress at this point the cellulitis seems to have improved  after last week's evaluation. Overall she is having no significant discomfort which is excellent news. She does have an appointment with Dr. dew on March 29, 2018 for reevaluation in regard to the stent he placed. She seems to have excellent blood flow in the right lower extremity. 01/19/12 on evaluation today patient's wound appears to be doing very well. In fact she does not appear to require debridement at this point, there's no evidence of infection, and overall from the standpoint of the wound she seems to be doing very well. With that being said I believe that it may be time to switch to different dressing away from the [facility] Dressing she tells me she does have a lot going on her friend actually passed away yesterday and she's also having a lot of issues with her husband this obviously is weighing heavy on her as far as your thoughts and concerns today. 01/25/18 on evaluation today patient appears to be doing fairly well in regard to her right lateral malleolus. She has been tolerating the dressing changes without complication. Overall I feel like this is definitely showing signs of improvement as far as how the overall appearance of the wound is there's also evidence of epithelium start to migrate over the granulation tissue. In general I think that she is progressing nicely as far as the wound is concerned. The only concern she really has is whether or not we can switch to every other week visits in order to avoid having as many appointments as her daughters have a difficult time getting her to her appointments as well as the patient's husband to his he is not doing very well at this point. 02/22/18 on evaluation today patient's right lateral malleolus ulcer  appears to be doing great. She has been tolerating the dressing changes without complication. Overall you making excellent progress at this time. Patient is having no significant discomfort. MATTYE, VERDONE ([id number]) 03/15/18  on evaluation today patient appears to be doing much more poorly in regard to her right lateral ankle ulcer at this point. Unfortunately since have last seen her her husband has passed just a few days ago is obviously weighed heavily on her her daughter also had surgery well she is with her today as usual. There does not appear to be any evidence of infection she does seem to have significant contusion/deep tissue injury to the right lateral malleolus which was not noted previous when I saw her last. It's hard to tell of exactly when this injury occurred although during the time she was spending the night in the hospital this may have been most likely. 03/22/18 on evaluation today patient appears to actually be doing very well in regard to her ulcer. She did unfortunately have a setback which was noted last week however the good news is we seem to be getting back on track and in fact the wound in the core did still have some necrotic tissue which will be addressed at this point today but in general I'm seeing signs that things are on the up and up. She is glad to hear this obviously she's been somewhat concerned that due to the how her wound digressed more recently. 03/29/18 on evaluation today patient appears to be doing fairly well in regard to her right lower extremity lateral malleolus ulcer. She unfortunately does have a new area of pressure injury over the inferior portion where the wound has opened up a little bit larger secondary to the pressure she seems to be getting. She does tell me sometimes when she sleeps at night that it actually hurts and does seem to be pushing on the area little bit more unfortunately. There does not appear to be any evidence of infection which is good news. She has been tolerating the dressing changes without complication. She also did have some bruising in the left second and third toes due to the fact that she may have bump this or injured it although she has  neuropathy so she does not feel she did move recently that may have been where this came from. Nonetheless there does not appear to be any evidence of infection at this time. 04/12/18 on evaluation today patient's wound on the right lateral ankle actually appears to be doing a little bit better with a lot of necrotic docking tissue centrally loosening up in clearing away. However she does have the beginnings of a deep tissue injury on the left lateral malleolus likely due to the fact we've been trying offload the right as much as we have. I think she may benefit from an assistive soft device to help with offloading and it looks like they're looking at one of the doughnut conditions that wraps around the lower leg to offload which I think will definitely do a good job. With that being said I think we definitely need to address this issue on the left before it becomes a wound. Patient is not having significant pain. 04/19/18 on evaluation today patient appears to be doing excellent in regard to the progress she's made with her right lateral ankle ulcer. The left ankle region which did show evidence of a deep tissue injury seems to be resolving there's little fluid noted underneath and a blister there's  nothing open at this point in time overall I feel like this is progressing nicely which is good news. She does not seem to be having significant discomfort at this point which is also good news. 04/25/18-She is here in follow up evaluation for bilateral lateral malleolar ulcers. The right lateral malleolus ulcer with pale subcutaneous tissue exposure, central area of ulcer with tendon/periosteum exposed. The left lateral malleolus ulcer now with central area of nonviable tissue, otherwise deep tissue injury. She is wearing compression wraps to the left lower extremity, she will place the right lower extremity compression wraps on when she gets home. She will be out of town over the weekend and return next  week and follow-up appointment. She completed her doxycycline this morning 05/03/18 on evaluation today patient appears to be doing very well in regard to her right lateral ankle ulcer in general. At least she's showing some signs of improvement in this regard. Unfortunately she has some additional injury to the left lateral malleolus region which appears to be new likely even over the past several days. Again this determination is based on the overall appearance. With that being said the patient is obviously frustrated about this currently. 05/10/18-She is here in follow-up evaluation for bilateral lateral malleolar ulcers. She states she has purchased offloading shoes/boots and they will arrive tomorrow. She was asked to bring them in the office at next week's appointment so her provider is aware of product being utilized. She continues to sleep on right or left side, she has been encouraged to sleep on her back. The right lateral malleolus ulcer is precariously close to peri-osteum; will order xray. The left lateral malleolus ulcer is improved. Will switch back to santyl; she will follow up next week. 05/17/18 on evaluation today patient actually appears to be doing very well in regard to her malleolus her ulcers compared to last time I saw them. She does not seem to have as much in the way of contusion at this point which is great news. With that being said she does continue to have discomfort and I do believe that she is still continuing to benefit from the offloading/pressure reducing boots that were recommended. I think this is the key to trying to get this to heal up completely. 05/24/18 on evaluation today patient actually appears to be doing worse at this point in time unfortunately compared to her last week's evaluation. She is having really no increased pain which is good news unfortunately she does have more maceration in your theme and noted surrounding the right lateral ankle the left  lateral ankle is not really is erythematous I do not see signs of the overt cellulitis on that side. Unfortunately the wounds do not seem to have shown any signs of improvement since the last evaluation. She also has significant swelling especially on the right compared to previous some of this may be due to infection however also think that she may be served better while she has these wounds by compression wrapping versus continuing to use the Juxta-Lite for the time being. Especially with the amount of drainage that she is experiencing at this point. No fevers, chills, nausea, or vomiting noted at this time. 05/31/18 on evaluation today patient appears to actually be doing better in regard to her right lateral lower extremity ulcer specifically on the malleolus region. She has been tolerating the antibiotic without complication. With that being said she still continues to have issues but a little bit of redness although nothing like  she what she was experiencing previous. She still continues to pressure to her ankle area she did get the problem on offloading boots unfortunately she will not wear them she states there too uncomfortable and she can't get in and out of the bed. Nonetheless at this point her wounds seem to be continually getting worse which is not what we want I'm getting somewhat concerned about her progress and how things are going to proceed if we do not intervene in some way shape or form. I therefore had a very lengthy conversation today about offloading yet again and even made a specific suggestion for switching her to a memory foam mattress and even gave the information for a specific one that they could look at getting if it was something that they were interested in considering. She does not want to be considered for a hospital bed air mattress although honestly insurance would not cover it that she does not have any wounds on her trunk. 06/14/18 on evaluation today both wounds  over the bilateral lateral malleolus her ulcers appear to be doing better there's no evidence of pressure injury at this point. She did get the foam mattress for her bed and this does seem to have been extremely beneficial for her in my pinion. Her daughter states that she is having difficulty getting out of bed because of how soft it is. The patient also relates this to be. Nonetheless I do feel like she's actually doing better. Unfortunately right after and around the time she was getting the mattress she also sustained a fall when she got up to go pick up the phone and ended up injuring her right elbow she has [address] sut[address] place. We are not caring for this currently although home health is going to be taking the sutures out shortly. Nonetheless this may be something that we need to evaluate going forward. It depends on how well it has or has not healed in the end. She also recently saw an orthopedic specialist for an injection in the right shoulder just before her fall unfortunately the fall seems to have worsened her pain. 06/21/18 on evaluation today patient appears to be doing about the same in regard to her lateral malleolus ulcers. Both appear to be just a little bit deeper but again we are clinging away the necrotic and dead tissue which I think is why this is progressing towards a deeper realm as opposed MONCHEL, POLLITT. ([id number]) to improving from my measurement standpoint in that regard. Nonetheless she has been tolerating the dressing changes she absolutely hates the memory foam mattress topper that was obtained for her nonetheless I do believe this is still doing excellent as far as taking care of excess pressure in regard to the lateral malleolus regions. She in fact has no pressure injury that I see whereas in weeks past it was week by week I was constantly seeing new pressure injuries. Overall I think it has been very beneficial for her. 07/03/18; patient arrives in my clinic  today. She has deep punched out areas over her bilateral lateral malleoli. The area on the right has some more depth. We spent a lot of time today talking about pressure relief for these areas. This started when her daughter asked for a prescription for a memory foam mattress. I have never written a prescription for a mattress and I don't think insurances would pay for that on an ordinary bed. In any case he came up that she has foam  boots that she refuses to wear. I would suggest going to these before any other offloading issues when she is in bed. They say she is meticulous about offloading this the rest of the day 07/10/18- She is seen in follow-up evaluation for bilateral, lateral malleolus ulcers. There is no improvement in the ulcers. She has purchased and is sleeping on a memory foam mattress/overlay, she has been using the offloading boots nightly over the past week. She has a follow up appointment with vascular medicine at the end of October, in my opinion this follow up should be expedited given her deterioration and suboptimal TBI results. We will order plain film xray of the left ankle as deeper structures are palpable; would consider having MRI, regardless of xray report(s). The ulcers will be treated with iodoflex/iodosorb, she is unable to safely change the dressings daily with santyl. 07/19/18 on evaluation today patient appears to be doing in general visually well in regard to her bilateral lateral malleolus ulcers. She has been tolerating the dressing changes without complication which is good news. With that being said we did have an x-ray performed on 07/12/18 which revealed a slight loosen see in the lateral portion of the distal left fibula which may represent artifact but underline lytic destruction or osteomyelitis could not be excluded. MRI was recommended. With that being said we can see about getting the patient scheduled for an MRI to further evaluate this area. In fact we have  that scheduled currently for August 20 19,019. 07/26/18 on evaluation today patient's wound on the right lateral ankle actually appears to be doing fairly well at this point in my pinion. She has made some good progress currently. With that being said unfortunately in regard to the left lateral ankle ulcer this seems to be a little bit more problematic at this time. In fact as I further evaluated the situation she actually had bone exposed which is the first time that's been the case in the bone appear to be necrotic. Currently I did review patient's note from Dr. Bunnie Domino office with [facility]. He stated that ABI was 1.26 on the right and 0.95 on the left with good waveforms. Her perfusion is stable not reduced from previous studies and her digital waveforms were pretty good particularly on the right. His conclusion upon review of the note was that there was not much she could do to improve her perfusion and he felt she was adequate for wound healing. His suggestion was that she continued to see [place] and consider a synthetic skin graft if there was no underlying infection. He plans to see her back in six months or as needed. 08/01/18 on evaluation today patient appears to be doing better in regard to her right lateral ankle ulcer. Her left lateral ankle ulcer is about the same she still has bone involvement in evidence of necrosis. There does not appear to be evidence of infection at this time On the right lateral lower extremity. I have started her on the Augmentin she picked this up and started this yesterday. This is to get her through until she sees infectious disease which is scheduled for 08/12/18. 08/06/18 on evaluation today patient appears to be doing rather well considering my discussion with patient's daughter at the end of last week. The area which was marked where she had erythema seems to be improved and this is good news. With that being said overall the patient seems  to be making good improvement when it  comes to the overall appearance of the right lateral ankle ulcer although this has been slow she at least is coming around in this regard. Unfortunately in regard to the left lateral ankle ulcer this is osteomyelitis based on the pathology report as well is bone culture. Nonetheless we are still waiting CT scan. Unfortunately the MRI we originally ordered cannot be performed as the patient is a pacemaker which I had overlooked. Nonetheless we are working on the CT scan approval and scheduling as of now. She did go to the hospital over the weekend and was placed on IV Cefzo for a couple of days. Fortunately this seems to have improved the erythema quite significantly which is good news. There does not appear to be any evidence of worsening infection at this time. She did have some bleeding after the last debridement therefore I did not perform any sharp debridement in regard to left lateral ankle at this point. Patient has been approved for a snap vac for the right lateral ankle. 08/14/18; the patient with wounds over her bilateral lateral malleoli. The area on the right actually looks quite good. Been using a snap back on this area. Healthy granulation and appears to be filling in. Unfortunately the area on the left is really problematic. 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 Also noted to have cellulitis. She saw Dr. Novella Olive of infectious disease today and was put on linezolid. We are able to verify this with her pharmacy. She is completed the Augmentin that she was already on. We've been using Iodoflex to this area 08/23/18 on evaluation today patient's wounds both actually appear to be doing better compared to my prior evaluations. Fortunately she showing signs of good improvement in regard to the overall wound status especially where were using the snap vac on the right. In  regard to left lateral malleolus the wound bed actually appears to be much cleaner than previously noted. I do not feel any phone directly probed during evaluation today and though there is tendon noted this does not appear to be necrotic it's actually fairly good as far as the overall appearance of the tendon is concerned. In general the wound bed actually appears to be doing significantly better than it was previous. Patient is currently in the care of [name] and I did review that note today. He actually has her on two weeks of linezolid and then following the patient will be on 1-2 months of Keflex. That is the plan currently. She has been on antibiotics therapy as prescribed by myself initially starting on July 30, 2018 and has been on that continuously up to this point. 08/30/18 on evaluation today patient actually appears to be doing much better in regard to her right lateral malleolus ulcer. She has been tolerating the dressing changes specifically the snap vac without complication although she did have some issues with the seal currently. Apparently there was some trouble with getting it to maintain over the past week past Sunday. Nonetheless overall the wound appears better in regard to the right lateral malleolus region. In regard to left lateral malleolus this actually show some signs of additional granulation although there still tendon noted in the base of the wound this appears to be healthy not necrotic in any way whatsoever. We are considering potentially using a snap vac for the left lateral malleolus as well the product wrap from [employer], [place], was present in  the clinic today we're going to see this patient I did have her come in with me after obtaining consent from the patient and her daughter in order to look at the wound and see if there's any recommendation one way or another as to whether or not they felt the snapback could be beneficial for the left lateral malleolus region.  But the conclusion was that it might be but that this is definitely a little bit deeper wound than what traditionally would be utilized for a snap vac. 09/06/18 on evaluation today patient actually appears to be doing excellent in my pinion in regard to both ankle ulcers. She has been tolerating the dressing changes without complication which is great news. Specifically we have been using the snap vac. In regard to the right ankle I'm not even sure that this is going to be necessary for today and following as the wound has filled in quite nicely. In regard to the left ankle I do believe SEASON, ASTACIO ([id number]) that we're seeing excellent epithelialization from the edge as well as granulation in the central portion the tendon is still exposed but there's no evidence of necrotic bone and in general I feel like the patient has made excellent progress even compared to last week with just one week of the snap vac. 09/11/18; this is a patient who has wounds on her bilateral lateral malleoli. Initially both of these were deep stage IV wounds in the setting of chronic arterial insufficiency. She has been revascularized. As I understand think she been using snap vacs to both of these wounds however the area on the right became more superficial and currently she is only using it on the left. Using silver collagen on the right and silver collagen under the back on the left I believe 09/19/18 on evaluation today patient actually appears to be doing very well in regard to her lateral malleolus or ulcers bilaterally. She has been tolerating the dressing changes without complication. Fortunately there does not appear to be any evidence of infection at this time. Overall I feel like she is improving in an excellent manner and I'm very pleased with the fact that everything seems to be turning towards the better for her. This has obviously been a long road. 09/27/18 on evaluation today patient actually appears  to be doing very well in regard to her bilateral lateral malleolus ulcers. She has been tolerating the dressing changes without complication. Fortunately there does not appear to be any evidence of infection at this time which is also great news. No fevers, chills, nausea, or vomiting noted at this time. Overall I feel like she is doing excellent with the snap vac on the left malleolus. She had 40 mL of fluid collection over the past week. 10/04/18 on evaluation today patient actually appears to be doing well in regard to her bilateral lateral malleolus ulcers. She continues to tolerate the dressing changes without complication. One issue that I see is the snap vac on the left lateral malleolus which appears to have sealed off some fluid underlying this area and has not really allowed it to heal to the degree that I would like to see. For that reason I did suggest at this point we may want to pack a small piece of packing strip into this region to allow it to more effectively wick out fluid. 10/11/18 in general the patient today does not feel that she has been doing very well. She's been a little bit lethargic and  subsequently is having bodyaches as well according to what she tells me today. With that being said overall she has been concerned with the fact that something may be worsening although to be honest her wounds really have not been appearing poorly. She does have a new ulcer on her left heel unfortunately. This may be pressure related. Nonetheless it seems to me to have potentially started at least as a blister I do not see any evidence of deep tissue injury. In regard to the left ankle the snap vac still seems to be causing the ceiling off of the deeper part of the wound which is in turn trapping fluid. I'm not extremely pleased with the overall appearance as far as progress from last week to this week therefore I'm gonna discontinue the snap vac at this point. 10/18/18 patient unfortunately  this point has not been feeling well for the past several days. She was seen by Grayland Ormond her primary care provider who is a Librarian, academic at [facility]. Subsequently she states that she's been very weak and generally feeling malaise. No fevers, chills, nausea, or vomiting noted at this time. With that being said bloodwork was performed at the PCP office on the 11th of this month which showed a white blood cell count of 10.7. This was repeated today and shows a white blood cell count of 12.4. This does show signs of worsening. Coupled with the fact that she is feeling worse and that her left ankle wound is not really showing signs of improvement I feel like this is an indication that the osteomyelitis is likely exacerbating not improving. Overall I think we may also want to check her C-reactive protein and sedimentation rate. Actually did call [facility] office this afternoon while the patient was in the office here with me. Subsequently based on the findings we discussed treatment possibilities and I think that it is appropriate for [place] to go ahead and initiate treatment with doxycycline which I'm going to do. Subsequently he did agree to see about adding a CRP and sedimentation rate to her orders. If that has not already been drawn to where they can run it they will contact the patient she can come back to have that check. They are in agreement with plan as far as the patient and her daughter are concerned. Nonetheless also think we need to get in touch with Dr. Henreitta Leber office to see about getting the patient scheduled with him as soon as possible. 11/08/18 on evaluation today patient presents for follow-up concerning her bilateral foot and ankle ulcers. I did do an extensive review of her chart in epic today. Subsequently she was seen by [name] he did initiate Cefepime IV antibiotic therapy. Subsequently she had some issues with her PICC line this had to be removed because it  was coiled and then replaced. Fortunately that was now settled. Unfortunately she has continued have issues with her left heel as well as the issues that she is experiencing with her bilateral lateral malleolus regions. I do believe however both areas seem to be doing a little bit better on evaluation today which is good news. No fevers, chills, nausea, or vomiting noted at this time. She actually has an angiogram schedule with Dr. dew on this coming Monday, November 11, 2018. Subsequently the patient states that she is feeling much better especially than what she was roughly 2 weeks ago. She actually had to cancel an appointment because she was feeling so poorly. No fevers,  chills, nausea, or vomiting noted at this time. 11/15/18 on evaluation today patient actually is status post having had her angiogram with Dr. dew Monday, four days ago. 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 He had to go and work on several areas of the vasculature fortunately he was able to obtain no more than a 30% residual stenosis throughout post procedure. I reviewed this note today. I think this will definitely help with healing at this time. Fortunately there does not appear to be any signs of infection and I do feel like ratio already has a better appearance to it. 11/22/18 upon evaluation today patient actually appears to be doing very well in regard to her wounds in general. The right lateral malleolus looks excellent the heel looks better in the left lateral malleolus also appears to be doing a little better. With that being said the right second toe actually appears to be open and training we been watching this is been dry and stable but now is open. 12/03/2018 Seen today for follow-up and management of multiple bilateral lower extremity wounds. New pressure injury of the great toe which is closed at this time. Wound of the right distal second toe appears larger today with deep undermining  and a pocket of fluid present within the undermining region. Left and right malleolus is wounds are stable today with no signs and symptoms of infection.Denies any needs or concerns during exam today. 12/13/18 on evaluation today patient appears to be doing somewhat better in regard to her left heel ulcer. She also seems to be completely healed in regard to the right lateral malleolus ulcer. The left malleolus ulcer is smaller what unfortunately the wounds which are new over the first and second toes of the right foot are what are most concerning at this point especially the second. Both areas did require sharp debridement today. 12/20/18 on evaluation today patient's wound actually appears to be doing better in regard to left lateral ankle and her right lateral ankle continues to remain healed. The hill ulcer on the left is improved. She does have improvement noted as well in regard to both toe ulcers. Overall I'm very pleased in this regard. No fevers, chills, nausea, or vomiting noted at this time. 12/23/18 on evaluation today patient is seen after she had her toenails trimmed at the podiatrist office due to issues with her right great toe. There was what appeared to be dark eschar on the surface of the wound which had her in the podiatrist concerned. Nonetheless as I remember that during the last office visit I had utilize silver nitrate of this area I was much less concerned about the situation. Subsequently I was able to clean off much of this tissue without any complication today. This does not appear to show any signs of infection and actually look somewhat better Somera, [name] ([id number]) compared to last time post debridement. Her second toe on the right foot actually had callous over and there did appear still be some fluid underneath this that would require debridement today. 12/27/18 on evaluation today patient actually appears to be showing signs of improvement at all locations. Even  the left lateral ankle although this is not quite as great as the other sites. Fortunately there does not appear to be any signs of infection at this time and both of her toes on the right foot seem to be showing signs of improvement which is good news and very pleased  in this regard. 01/03/19 on evaluation today patient appears to be doing better for the most part in regard to her wounds in particular. There does not appear to be any evidence of infection at this time which is good news. Fortunately there is no sign of really worsening anywhere except for the right great toe which she does have what appears to be a bruise/deep tissue injury which is very superficial and already resolving. I'm not sure where this came from I questioned her extensively and she does not recall what may have happened with this. Other than that the patient seems to be doing well even the left lateral ankle ulcer looks good and is getting smaller. 01/10/19 on evaluation today patient appears to be doing well in regard to her left heel wound and both of her toe wounds. Overall I feel like there is definitely improvement here and I'm happy in that regard. With that being said unfortunately she is having issues with the left lateral malleolus ulcer which unfortunately still has a lot of depth to it. This is gonna be a very difficult wound for [place] to be able to truly get to heal. I may want to consider some type of skin substitute to see if this would be of benefit for her. I'll discuss this with her more the next visit most likely. This was something I thought about more at the end of the visit when I was Artie out of the room and the patient had been discharged. 01/17/19 on evaluation today patient appears to be doing very well in regard to her wounds in general. She's been making excellent progress at this time. Fortunately there's no sign of infection at this time either. No fevers, chills, nausea, or vomiting noted at this  time. The biggest issue is still her left lateral malleolus where it appears to be doing well and is getting smaller but still shows a small corner where this is deeper and goes down into what appears to be the joint space. Nonetheless this is taking much longer to heal although it still looks better in smaller than previous evaluations. 01/24/19 on evaluation today patient's wounds actually appear to be doing rather well in general overall. She did require some sharp debridement in regard to the right great toe but everything else appears to be doing excellent no debridement was even necessary. No fevers, chills, nausea, or vomiting noted at this time. 01/31/19 on evaluation today patient actually appears to be doing much better in regard to her left foot wound on the heel as well as the ankle. The right great toe appears to be a little bit worse today this had callous over and trapped a lot of fluid underneath. Fortunately there's no signs of infection at any site which is great news. 02/07/19 on evaluation today patient actually appears to be doing decently well in regard to all of her ulcers at this point. No sharp debridement was required she is a little bit of hyper granulation in regard to the left lateral ankle as well as the left heel but the hill itself is almost completely healed which is excellent news. Overall been very pleased in this regard. 02/14/19 on evaluation today patient actually appears to be doing very well in regard to her ulcers on the right first toe, left lateral malleolus, and left heel. In fact the heel is almost completely healed at this point. The patient does not show any signs of infection which is good news. Overall very  pleased with how things have progressed. 04/18/19 Telehealth Evaluation During the COVID-19 National Emergency: Verbal Consent: Obtained from patient Allergies: reviewed and the active list is current. Medication changes: patient has no current  medication changes. COVID-19 Screening: 1. Have you traveled internationally or on a cruise ship in the last 14 dayso No 2. Have you had contact with someone with or under investigation for COVID-19o No 3. Have you had a fever, cough, sore throat, or experiencing shortness of breatho No on evaluation today actually did have a visit with this patient through a telehealth encounter with her home health nurse. Subsequently it was noted that the patient actually appears to be doing okay in regard to her wounds both the right great toe as well as the left lateral malleolus have shown signs of improvement although this in your theme around the left lateral malleolus there eschar coverings for both locations. The question is whether or not they are actually close and whether or not home health needs to discharge the patient or not. Nonetheless my concern is this point obviously is that without actually seeing her and being able to evaluate this directly I cannot ensure that she is completely healed which is the question that I'm being asked. 04/22/19 on evaluation today patient presents for her first evaluation since last time I saw her which was actually February 14, 2019. I did do a telehealth visit last week in which point it was questionable whether or not she may be healed and had to bring her in today for confirmation. With that being said she does seem to be doing quite well at this point which is good news. There does not appear to be any drainage in the deed I believe her wounds may be healed. Readmission: 09/04/2019 on evaluation today patient appears to be doing unfortunately somewhat more poorly in regard to her left foot ulcer secondary to a wound that began on 08/21/2019 at least when she first noticed this. Fortunately she has not had any evidence of active infection at this time. Systemically. I also do not necessarily see any evidence of infection at the blister/wound site on the first  metatarsal head plantar aspect. This almost appears to be something that may have just rubbed inappropriately causing this to breakdown. They did not want a wait too long to come in to be seen as again she had significant issues in the past with wounds that took quite a while to heal in fact it was close to 2 years. Nonetheless this does not appear to be quite that bad but again we do need to remove some of the necrotic tissue from the surface of the wound to tell exactly the extent. She does not appear to have any significant arterial disease at this point and again her last ABIs and TBI's are recorded above in the alert section her left ABI was 1.27 with a TBI of 0.72 to the right ABI 1.08 with a TBI of 0.39. Other than this the patient has been doing quite well since I last saw her and that was in May 2020. 09/11/2019 on evaluation today patient appeared to be doing very well with regard to her plantar foot ulcer on the left. In fact this appears to be almost completely healed which is awesome. That is after just 1 week of intervention. With that being said there is no signs of active infection at this time. AMMY, LIENHARD ([id number]) 09/18/2019 on evaluation today patient actually appears to be doing  excellent in fact she is completely healed based on what I am seeing at this point. Fortunately there is no signs of active infection at this time and overall patient is very pleased to hear that this area has healed so quickly. Readmission: 05/13/2021 upon evaluation today this patient presents for reevaluation here in the clinic. This is a wound that actually we previously took care of. She had 1 on the right ankle and the left the left turned out to be be harder due to to heal but nonetheless is doing great at this point as the right that has reopened and it was noted first just several weeks ago with a scab over it and came off in just the past few days. Fortunately there does not appear to  be any obvious evidence of significant active infection at this time which is great news. No fevers, chills, nausea, vomiting, or diarrhea. 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 There does not appear to be any signs of this interfering in any way with her wound. She does have swelling we previously had compression socks for her ordered but again it does not look like she wears these on a regular basis by any means. 05/26/2021 upon evaluation today patient appears to be doing well with regard to her wound which is actually showing signs of excellent improvement. There does not appear to be any signs of active infection which is great news and overall very pleased with where things stand today. No fevers, chills, nausea, vomiting, or diarrhea. 06/02/2021 upon evaluation today patient's wound actually showing signs of excellent improvement. Fortunately there does not appear to be any signs of active infection which is great news. I think the patient is making good progress with regard to her wounds in general. 06/09/2021 upon evaluation today patient appears to be doing excellent in regard to her wounds currently. Fortunately there does not appear to be any signs of active infection which is great news. No fevers, chills, nausea, vomiting, or diarrhea. Overall extremely pleased with where things stand today. I think the patient is making excellent progress. 06/16/2021 upon evaluation today patient appears to be doing well in regard to her wound. This is going require little bit of debridement today and that was discussed with the patient. Otherwise she seems to be doing quite well and I am actually very pleased with where things stand at this point. No fevers, chills, nausea, vomiting, or diarrhea. 06/23/2021 upon evaluation today patient appears to be doing well with regard to her wounds. She has been tolerating the dressing changes without complication.  Fortunately there does not appear to be any evidence of infection and she has not had air in her home which she actually lives at an assisted living that got fixed this morning. With that being said because of that her wrap has been extremely hot and bothersome for her over the past week. 06/30/2021 upon evaluation today patient is actually making excellent progress in regard to her ankle ulcer. She has been tolerating the dressing changes without complication and overall extremely pleased with where things stand there does not appear to be any evidence of active infection which is great news. No fevers, chills, nausea, vomiting, or diarrhea. 07/07/21 upon evaluation today patients and culture on the right actually appears to be doing quite well. There does not appear to be any signs of infection and overall very pleased with where things stand today. No fevers,  chills, nausea, or vomiting noted at this time. 07/14/2021 unfortunately the patient today has some evidence of deep tissue injury and pressure getting to the ankle region. Again I am not exactly sure what is going on here but this is very similar to issues that we have had in the past. I explained to the patient that she needs to be very mindful of exactly what is happening I think sleeping in bed is probably the main issue here although there could be other culprits I am not sure what else would potentially lead to this kind of a problem for her. 07/21/2021 upon evaluation today patient's wound actually showing signs of improvement compared to last week. Fortunately there does not appear to be any signs of active infection which is great news and overall very pleased with where things stand in that regard. With that being said I do believe that she is continuing to show signs of overall of getting better although I think this is still basically about what we were 2 weeks ago due to the worsening and now improvement. 07/28/2021 upon evaluation  today patient appears to be doing well with regard to her wound. She does have some slough buildup on the surface of the wound which I would have to manage today. Fortunately there is no sign of active infection at this time. No fevers, chills, nausea, vomiting, or diarrhea. 08/04/2021 upon evaluation today patient appears to be doing about the same in regard to her wound. To be perfectly honest I am beginning to be a little bit concerned about the overall appearance of the wound bed. I do think possibly taking a sample right around the margin of the wound could be beneficial for her as far as identifying anything such as an inflammatory process or to be honest even a skin tag cancer type process that may be of concern here. Fortunately there does not appear to be any evidence of active infection at this time which is great news she is not having any pain also great news. 08/11/2021 upon evaluation today patient appears to be doing well with regard to her wound. The good news is I did review her biopsy results and it showed some inflammatory mixed findings but nothing that appeared to be malignant which is great news. Overall this is more of a chronic venous stasis type issue which again is more what we have been treating. Nonetheless I just wanted to make sure before going forward that there was not anything more untoward going on at this point. 08/18/2021 upon evaluation today patient appears to be doing well with regard to her ankle ulcer. Fortunately there does not appear to be any signs of active infection at this time which is great overall wound is dramatically improved compared to last week. Since last week I have actually placed her on doxycycline and subsequently this is a good option as far as the findings are concerned at this point. I do believe that the positive result of MRSA is definitely something that needed to be addressed and the good news is The doxycycline is doing a good job of doing  this. the doxycycline is doing that. There does not appear to be any evidence of active infection systemically which is great news. 08/25/2021 upon evaluation today patient appears to be doing well with regard to her wound. I feel like we are finally get back on track as far as healing is concerned I am much happier with the overall appearance today. I  do think that she is tolerating the dressing changes without complication which is great news. We have been using Hydrofera Blue which I think is a good option. The good news is she is also doing great in regard to her compression sock on the left which is a zipper compression that seems to be doing a great job keeping her edema under good control. 09/01/2021 upon evaluation today patient appears to be doing well with regard to her wound. Infection seems to be under much better control which is great news and very pleased in that regard. Fortunately there does not appear to be any signs of infection currently. ELLEAN, FIRMAN ([id number]) 09/08/2021 upon evaluation today patient actually appears to be making good progress in regard to her wound. She has been tolerating the dressing changes without complication. Fortunately there does not appear to be any evidence of active infection at this time which is great news as well. No fevers, chills, nausea, vomiting, or diarrhea. 09/22/2021 upon evaluation today patient appears to be doing well with regard to her wound although is very slow to heal. We have not looked into Apligraf yet I think that is something that we should see about doing. 09/29/2021 upon evaluation today patient's wound is actually showing signs of doing about the same. I am not seeing any evidence of worsening but also no significant evidence of improvement. We did gain approval for the organogenesis products all except for Apligraf as covered by her insurance. With that being said I do think that we can go ahead and proceed with the  NuShield if the patient and her family in agreement of the plan I discussed that with him today she does have a 20% coinsurance which we also discussed. 10/06/2021 upon evaluation today patient appears to unfortunately be doing a little bit worse she appears to be infected based on what I am seeing. Fortunately there does not appear to be any signs of active infection at this time which is great news. Unfortunately it does appear to be some evidence of around the wound edge indicated by way of erythema and warmth as well as redness 10/13/2021 upon evaluation today patient actually appears to be doing excellent in regard to her ankle ulcer compared to what it was. 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 I think were fine to go ahead and apply the NuShield today. 10/20/2021 upon evaluation today patient appears to be doing excellent in fact the NuShield seems to have done all some for her thus far. I am actually very pleased with where we stand and overall I think that she is making great progress. There is no evidence of active infection at this time. 11/23; patient presents for follow-up. She has no issues or complaints today. She denies signs of infection. She reports stability in her wound healing. 11/03/2021 upon evaluation today patient appears to be doing well with regard to her wounds. She has been tolerating the dressing changes without complication. Fortunately there is no signs of active infection at this time. 11/10/2021 upon evaluation today patient appears to be doing well with regard to her right ankle which is actually showing signs of improvement with the NuShield I am very pleased. Subsequently in regards to the left ankle this appears to be doing okay with no  evidence of issue here  either. With that being said she has a significant contusion on the left leg from having fallen 2 days ago. She tells me that she was using her walker going to the closet and then when she got to the closet turned around to get something out at which point she fell according to the story. Nonetheless she does have a hematoma just below her knee on the anterior portion of her shin. My hope is that this will not open until wound although we do need I think Some compression over it also think that we need to have her use an ice as well to help with the swelling and prevent this from getting worse. The last thing she needs is a wound opened up here. 11/17/2021 upon evaluation patient's right ankle actually showing signs of improvement based on what I see currently there is a lot of new skin growth coming in which is great news. Nonetheless I do feel like that the patient is showing improvement as well in regard to her left leg with the use of the Tubigrip it swollen but not as bruised as what I would have expected after what I was seeing last week. Nonetheless I do think doubling up on the Tubigrip would probably be beneficial to try to keep some of the edema under better control here. 11/24/2021 upon evaluation today patient appears to be doing excellent in regard to her wound. This is actually measuring significantly smaller which is great news. Fortunately I do not see any signs of active infection locally nor systemically at this point. Overall I am very pleased with how the NuShield is doing. 12/30; wound bed looks healthy however not much change in overall wound volume. We applied Nushield again today in the standard fashion 12/08/2021 upon evaluation today patient's wound actually showing signs of good improvement and I am actually very pleased with where we stand today as well. I do not see any evidence of active infection locally nor systemically which is great news. Unfortunately she has been  having some issues with stomach upset and diarrhea today. 12/15/2021 upon evaluation today patient appears to be doing excellent in regard to her wound. There is a little bit of dry skin raised up around the edges of the wound but fortunately nothing too significant at this point. Fortunately I do not see any evidence of active infection either which is great news. No fevers, chills, nausea, vomiting, or diarrhea. 12/29/2021 upon evaluation today patient appears to be doing well with regard to her wound. In general I feel like she is getting very close to complete resolution. I think that she is making good progress here as well. Overall I do not see any signs of infection and very little of this appears to actually be open. 01/12/2022 upon evaluation today patient appears to be doing a little bit worse in regard to her wound. It appears that the collagen actually trapped fluid underneath the collagen which got hard and subsequently caused the fluid to back up. This patient has made the wound appear to be larger than what it was previous. With that being said I do not see any signs of obvious infection is not warm to touch and not significantly erythematous either which is good news. Nonetheless I do think that we will need to continue to keep an eye on this. Probably not go put on antibiotic this week but I will be too far from doing so she is actually on  a Z-Pak right now I do not want to really double up on antibiotics. 01/26/2022 upon evaluation today patient's wound is showing signs of improvement although this is slow to turn back around. Fortunately there does not appear to be any evidence of active infection locally or systemically which is great news and overall I am extremely pleased with where we stand today. No fevers, chills, nausea, vomiting, or diarrhea. 02/02/2022 upon evaluation today patient appears to be doing well with regard to her wound. She has been tolerating the dressing changes  without complication. Fortunately there does not appear to be any signs of active infection locally nor systemically at this time which is great news. No fevers, chills, nausea, vomiting, or diarrhea. 3/9; patient presents for follow-up. She has no issues or complaints today. She denies signs of infection. 02/16/2022 upon evaluation today patient appears to be doing well with regard to her wound. She has been tolerating the dressing changes without complication. Fortunately I do not see any evidence of active infection locally nor systemically at this point which is great news. Overall I PAYGE, EPPES ([id number]) think that the patient is making excellent progress in general. The wound is measuring smaller and though there does appear to be some need for sharp debridement today this is minimal compared to what we have noted previous. 02/23/2022 upon evaluation today patient appears to be doing well with regard to her wound she is definitely showing signs of improvement which is great news. 03/02/2022 upon evaluation today patient appears to be doing about the same in regard to her wound. Unfortunately were not seeing significant improvement. With that being said is also not significantly worse which is great news. No fevers, chills, nausea, vomiting, or diarrhea. 03-09-2022 upon evaluation today patient appears to be doing well with regard to her wound. She has been tolerating the dressing changes without complication. Fortunately there does not appear to be any signs of active infection locally or systemically which is great news. 03-16-2022 upon evaluation today patient appears to be doing well with regard to her wound this is measuring smaller and looking much better. I am actually very pleased with where we stand and I think that the patient is making great progress. I do not see any evidence of active infection locally or systemically which is great news. No fevers, chills, nausea, vomiting, or  diarrhea. 03-23-2022 upon evaluation today patient appears to be doing better in regard to her wounds. In fact the wound area actually is showing signs of excellent improvement and actually very pleased with where we stand today. I do not see any evidence of active infection locally nor systemically which is great news. 4/27; comes in today with again thick callus around the wound circumference which I removed with a curette. Some debris on the surface. Overall I do not think quite as good as it was last week by description. Using endoform 04-06-2022 upon evaluation today patient appears to be doing pretty well in regard to her wound. Fortunately I do not see any evidence of active infection locally or systemically which is great news and overall I am pleased in that regard. I do feel like this is measuring smaller postdebridement compared to where we were previous. Overall she is headed in the right direction. She does have an area that is threatening to open and looks a little irritated on the left ankle we did measure this just for the discolored area for now its not draining too much but we  want to monitor and make sure nothing worsens here. 04-13-2022 upon evaluation today patient appears to be doing better in regard to her wound although this is still very slow to heal. We have reapplied for a skin substitute but have not heard anything back as of yet. Fortunately I do not see any evidence of active infection locally or systemically at this time which is great news. 04-20-2022 upon evaluation today patient's wound is actually showing signs of significant improvement which is great news. We did get approval for skin substitutes which I think is an option here for [place] but at the same time I am not even certain that this is going to be necessary especially considering how well things seem to be doing at this point. If she continues healing as we see her right now but I think were probably going to  be able to avoid any need for additional skin substitute. Patient and her daughter are both in agreement with that plan. With that being said she did have a fall she has several areas of contusion fortunately nothing that is can require intervention at this point but nonetheless she did also fracture her rib which is extremely uncomfortable obviously. 04-27-2022 upon evaluation today patient appears to be doing well currently in regard to her wound on the right lateral ankle. Overall I feel like she is actually doing significantly better even compared to last week and very pleased in this regard. I think we are on the right track here. Electronic Signature(s) Signed: 04/27/2022 5:31:51 PM By: Worthy Keeler PA-C Entered By: Worthy Keeler on 04/27/2022 17:31:50 Eaddy, [name] ([id number]) -------------------------------------------------------------------------------- Physical Exam Details Patient Name: [name] Date of Service: 04/27/2022 10:15 AM Medical Record Number: [id number] Patient Account Number: [id number] Date of Birth/Sex: March 05, 1923 (88 y.o. F) Treating RN: Carlene Coria Primary Care Provider: Adrian Prows Other Clinician: Referring Provider: Adrian Prows Treating Provider/Extender: Skipper Cliche in Treatment: 39 Constitutional Well-nourished and well-hydrated in no acute distress. Respiratory normal breathing without difficulty. Psychiatric this patient is able to make decisions and demonstrates good insight into disease process. Alert and Oriented x 3. pleasant and cooperative. Notes Upon inspection patient's wound bed showed signs of good granulation epithelization at this point. Fortunately there does not appear to be any evidence of active infection locally or systemically which is great news and overall very pleased with where things stand today. Electronic Signature(s) Signed: 04/27/2022 5:32:09 PM By: Worthy Keeler PA-C Entered By: Worthy Keeler on 04/27/2022 17:32:08 Daughety, [name] ([id number]) -------------------------------------------------------------------------------- Physician Orders Details Patient Name: [name] Date of Service: 04/27/2022 10:15 AM Medical Record Number: [id number] Patient Account Number: [id number] Date of Birth/Sex: 1923/06/25 (88 y.o. 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 Bathing/ Shower/ Hygiene o May shower with wound dressing protected with water repellent cover or cast protector. Edema Control - Lymphedema / Segmental Compressive Device / Other o Optional: One layer of unna paste to top of compression wrap (to act as an anchor). - and at toes o Patient to wear own compression stockings. Remove compression stockings every night before  going to bed and put on every morning when getting up. - left leg o Elevate, Exercise Daily and Avoid Standing for Long Periods of Time. o Elevate legs to the level of the heart and pump ankles as often as possible o Elevate leg(s) parallel to the floor when sitting. Wound Treatment Wound #7 - Malleolus Wound Laterality: Right, Lateral Cleanser: Soap and Water 1 x Per Week/30 Days Discharge Instructions: Gently cleanse wound with antibacterial soap, rinse and pat dry prior to dressing wounds Primary Dressing: Endoform 2x2 (in/in) 1 x Per Week/30 Days Discharge Instructions: Apply Endoform as directed Secondary Dressing: Gauze  1 x Per Week/30 Days Discharge Instructions: As directed: dry, moistened with saline or moistened with Dakins Solution Secondary Dressing: mepitil one 1 x Per Week/30 Days Compression Wrap: 3-LAYER WRAP - Profore Lite LF 3 Multilayer Compression Bandaging System 1 x Per Week/30 Days Discharge Instructions: Apply 3 multi-layer wrap as prescribed. Electronic Signature(s) Signed: 04/27/2022 2:19:12 PM By: Carlene Coria RN Signed: 04/28/2022 4:54:58 PM By: Worthy Keeler PA-C Entered By: Carlene Coria on 04/27/2022 10:47:03 Heckard, [name] ([id number]) -------------------------------------------------------------------------------- Problem List Details Patient Name: [name] Date of Service: 04/27/2022 10:15 AM Medical Record Number: [id number] Patient Account Number: [id number] Date of Birth/Sex: December 17, 1922 (88 y.o. 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, stage 3 05/13/2021 No Yes I89.0 Lymphedema, not elsewhere classified 05/13/2021 No Yes I87.2 Venous insufficiency (chronic) (peripheral) 05/13/2021 No Yes Z95.0 Presence of cardiac pacemaker 05/13/2021 No Yes Z79.01 Long term (current) use of anticoagulants 05/13/2021 No Yes Inactive Problems ICD-10 Code Description Active Date Inactive Date S80.12XA Contusion of left lower leg, initial encounter 11/10/2021 11/10/2021 Resolved Problems Electronic Signature(s) Signed: 04/27/2022 10:10:59 AM By: Worthy Keeler PA-C Entered By: Worthy Keeler on 04/27/2022 10:10:59 Balistreri, [name] ([id number]) -------------------------------------------------------------------------------- Progress Note Details Patient Name: [name] Date of Service: 04/27/2022 10:15  AM Medical Record Number: [id number] Patient Account Number: [id number] Date of Birth/Sex: 1923/08/15 (88 y.o. 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 Dr. Amalia Hailey the podiatrist saw her on 07/20/2017 and sent her to the wound center for possible hyperbaric oxygen therapy. past medical history of peripheral vascular disease, varicose veins, status post appendectomy, basal cell carcinoma excision from the left leg, cholecystectomy, pacemaker placement, right lower extremity angiography done by Dr. dew in March 2017 with placement of a stent. there is also note of a successful ablation of the right small saphenous vein done which was reviewed by ultrasound on 10/24/2016. the patient had a right small saphenous vein ablation done on 10/20/2016. The patient has never been a smoker. She has been seen by Dr. Corene Cornea dew the vascular surgeon who most recently saw her on 06/15/2017 for evaluation of ongoing problems with right leg swelling. She had a lower extremity arterial duplex examination done(02/13/17) which showed patent distal right superficial femoral artery stent and above-the-knee popliteal stent without evidence of restenosis. The ABI was more than 1.3 on the  right and more than 1.3 on the left. This was consistent with noncompressible arteries due to medial calcification. The right great toe pressure and PPG waveforms are within normal limits and the left great toe pressure and PPG waveforms are decreased. he recommended she continue to wear her  compression stockings and continue with elevation. She is scheduled to have a noninvasive arterial study in the near future 08/16/2017 -- had a lower extremity arterial duplex examination done which showed patent distal right superficial femoral artery stent and above-the-knee popliteal stent without evidence of restenosis. The ABI was more than 1.3 on the right and more than 1.3 on the left. This was consistent with noncompressible arteries due to medial calcification. The right great toe pressure and PPG waveforms are within normal limits and the left great toe pressure and PPG waveforms are decreased. the x-ray of the right ankle has not yet been done 08/24/2017 -- had a right ankle x-ray -- IMPRESSION:1. No fracture, bone lesion or evidence of osteomyelitis. 2. Lateral soft tissue swelling with a soft tissue ulcer. she has not yet seen the vascular surgeon for review 08/31/17 on evaluation today patient's wound appears to be showing signs of improvement. She still with her appointment with vascular in order to review her results of her vascular study and then determine if any intervention would be recommended at that time. No fevers, chills, nausea, or vomiting noted at this time. She has been tolerating the dressing changes without complication. 09/28/17 on evaluation today patient's wound appears to show signs of good improvement in regard to the granulation tissue which is surfacing. There is still a layer of slough covering the wound and the posterior portion is still significantly deeper than the anterior nonetheless there has been some good sign of things moving towards the better. She is going to go back to Dr. dew for reevaluation to ensure her blood flow is still appropriate. That will be before her next evaluation with [place] next week. No fevers, chills, nausea, or vomiting noted at this time. Patient does have some discomfort rated to be a 3-4/10 depending on activity specifically  cleansing the wound makes it worse. 10/05/2017 -- the patient was seen by Dr. Lucky Cowboy last week and noninvasive studies showed a normal right ABI with brisk triphasic waveforms consistent with no arterial insufficiency including normal digital pressures. The duplex showed a patent distal right SFA stent and the proximal SFA was also normal. He was pleased with her test and thought she should have enough of perfusion for normal wound healing. He would see her back in 6 months time. 12/21/17 on evaluation today patient appears to be doing fairly well in regard to her right lateral ankle wound. Unfortunately the main issue that she is expansion at this point is that she is having some issues with what appears to be some cellulitis in the right anterior shin. She has also been noting a little bit of uncomfortable feeling especially last night and her ankle area. I'm afraid that she made the developing a little bit of an infection. With that being said I think it is in the early stages. 12/28/17 on evaluation today patient's ankle appears to be doing excellent. She's making good progress at this point the cellulitis seems to have improved after last week's evaluation. Overall she is having no significant discomfort which is excellent news. She does have an appointment with Dr. dew on March 29, 2018 for reevaluation in regard to the stent he placed. She seems to  have excellent blood flow in the right lower extremity. 01/19/12 on evaluation today patient's wound appears to be doing very well. In fact she does not appear to require debridement at this point, there's no evidence of infection, and overall from the standpoint of the wound she seems to be doing very well. With that being said I believe that it may be time to switch to different dressing away from the [facility] Dressing she tells me she does have a lot going on her friend actually passed away yesterday and she's also having a lot of issues with her  husband this obviously is weighing heavy on her as far as your thoughts and concerns today. 01/25/18 on evaluation today patient appears to be doing fairly well in regard to her right lateral malleolus. She has been tolerating the dressing changes without complication. Overall I feel like this is definitely showing signs of improvement as far as how the overall appearance of the wound is there's also evidence of epithelium start to migrate over the granulation tissue. In general I think that she is progressing nicely as far as the wound is concerned. The only concern she really has is whether or not we can switch to every other week visits in order to avoid having as many DERRY, ARBOGAST ([id number]) appointments as her daughters have a difficult time getting her to her appointments as well as the patient's husband to his he is not doing very well at this point. 02/22/18 on evaluation today patient's right lateral malleolus ulcer appears to be doing great. She has been tolerating the dressing changes without complication. Overall you making excellent progress at this time. Patient is having no significant discomfort. 03/15/18 on evaluation today patient appears to be doing much more poorly in regard to her right lateral ankle ulcer at this point. Unfortunately since have last seen her her husband has passed just a few days ago is obviously weighed heavily on her her daughter also had surgery well she is with her today as usual. There does not appear to be any evidence of infection she does seem to have significant contusion/deep tissue injury to the right lateral malleolus which was not noted previous when I saw her last. It's hard to tell of exactly when this injury occurred although during the time she was spending the night in the hospital this may have been most likely. 03/22/18 on evaluation today patient appears to actually be doing very well in regard to her ulcer. She did unfortunately have a  setback which was noted last week however the good news is we seem to be getting back on track and in fact the wound in the core did still have some necrotic tissue which will be addressed at this point today but in general I'm seeing signs that things are on the up and up. She is glad to hear this obviously she's been somewhat concerned that due to the how her wound digressed more recently. 03/29/18 on evaluation today patient appears to be doing fairly well in regard to her right lower extremity lateral malleolus ulcer. She unfortunately does have a new area of pressure injury over the inferior portion where the wound has opened up a little bit larger secondary to the pressure she seems to be getting. She does tell me sometimes when she sleeps at night that it actually hurts and does seem to be pushing on the area little bit more unfortunately. There does not appear to be any evidence of infection  which is good news. She has been tolerating the dressing changes without complication. She also did have some bruising in the left second and third toes due to the fact that she may have bump this or injured it although she has neuropathy so she does not feel she did move recently that may have been where this came from. Nonetheless there does not appear to be any evidence of infection at this time. 04/12/18 on evaluation today patient's wound on the right lateral ankle actually appears to be doing a little bit better with a lot of necrotic docking tissue centrally loosening up in clearing away. However she does have the beginnings of a deep tissue injury on the left lateral malleolus likely due to the fact we've been trying offload the right as much as we have. I think she may benefit from an assistive soft device to help with offloading and it looks like they're looking at one of the doughnut conditions that wraps around the lower leg to offload which I think will definitely do a good job. With that being  said I think we definitely need to address this issue on the left before it becomes a wound. Patient is not having significant pain. 04/19/18 on evaluation today patient appears to be doing excellent in regard to the progress she's made with her right lateral ankle ulcer. The left ankle region which did show evidence of a deep tissue injury seems to be resolving there's little fluid noted underneath and a blister there's nothing open at this point in time overall I feel like this is progressing nicely which is good news. She does not seem to be having significant discomfort at this point which is also good news. 04/25/18-She is here in follow up evaluation for bilateral lateral malleolar ulcers. The right lateral malleolus ulcer with pale subcutaneous tissue exposure, central area of ulcer with tendon/periosteum exposed. The left lateral malleolus ulcer now with central area of nonviable tissue, otherwise deep tissue injury. She is wearing compression wraps to the left lower extremity, she will place the right lower extremity compression wraps on when she gets home. She will be out of town over the weekend and return next week and follow-up appointment. She completed her doxycycline this morning 05/03/18 on evaluation today patient appears to be doing very well in regard to her right lateral ankle ulcer in general. At least she's showing some signs of improvement in this regard. Unfortunately she has some additional injury to the left lateral malleolus region which appears to be new likely even over the past several days. Again this determination is based on the overall appearance. With that being said the patient is obviously frustrated about this currently. 05/10/18-She is here in follow-up evaluation for bilateral lateral malleolar ulcers. She states she has purchased offloading shoes/boots and they will arrive tomorrow. She was asked to bring them in the office at next week's appointment so her  provider is aware of product being utilized. She continues to sleep on right or left side, she has been encouraged to sleep on her back. The right lateral malleolus ulcer is precariously close to peri-osteum; will order xray. The left lateral malleolus ulcer is improved. Will switch back to santyl; she will follow up next week. 05/17/18 on evaluation today patient actually appears to be doing very well in regard to her malleolus her ulcers compared to last time I saw them. She does not seem to have as much in the way of contusion at this point  which is great news. With that being said she does continue to have discomfort and I do believe that she is still continuing to benefit from the offloading/pressure reducing boots that were recommended. I think this is the key to trying to get this to heal up completely. 05/24/18 on evaluation today patient actually appears to be doing worse at this point in time unfortunately compared to her last week's evaluation. She is having really no increased pain which is good news unfortunately she does have more maceration in your theme and noted surrounding the right lateral ankle the left lateral ankle is not really is erythematous I do not see signs of the overt cellulitis on that side. Unfortunately the wounds do not seem to have shown any signs of improvement since the last evaluation. She also has significant swelling especially on the right compared to previous some of this may be due to infection however also think that she may be served better while she has these wounds by compression wrapping versus continuing to use the Juxta-Lite for the time being. Especially with the amount of drainage that she is experiencing at this point. No fevers, chills, nausea, or vomiting noted at this time. 05/31/18 on evaluation today patient appears to actually be doing better in regard to her right lateral lower extremity ulcer specifically on the malleolus region. She has been  tolerating the antibiotic without complication. With that being said she still continues to have issues but a little bit of redness although nothing like she what she was experiencing previous. She still continues to pressure to her ankle area she did get the problem on offloading boots unfortunately she will not wear them she states there too uncomfortable and she can't get in and out of the bed. Nonetheless at this point her wounds seem to be continually getting worse which is not what we want I'm getting somewhat concerned about her progress and how things are going to proceed if we do not intervene in some way shape or form. I therefore had a very lengthy conversation today about offloading yet again and even made a specific suggestion for switching her to a memory foam mattress and even gave the information for a specific one that they could look at getting if it was something that they were interested in considering. She does not want to be considered for a hospital bed air mattress although honestly insurance would not cover it that she does not have any wounds on her trunk. 06/14/18 on evaluation today both wounds over the bilateral lateral malleolus her ulcers appear to be doing better there's no evidence of pressure injury at this point. She did get the foam mattress for her bed and this does seem to have been extremely beneficial for her in my pinion. Her daughter states that she is having difficulty getting out of bed because of how soft it is. The patient also relates this to be. Nonetheless I do feel like she's actually doing better. Unfortunately right after and around the time she was getting the mattress she also sustained a fall when she got up to go pick up the phone and ended up injuring her right elbow she has 18 sutures in place. We are not caring for this currently although home health is going to be taking the sutures out shortly. Nonetheless this may be something that we need to  evaluate going forward. It depends on KHALANI, NOVOA ([id number]) how well it has or has not healed in the  end. She also recently saw an orthopedic specialist for an injection in the right shoulder just before her fall unfortunately the fall seems to have worsened her pain. 06/21/18 on evaluation today patient appears to be doing about the same in regard to her lateral malleolus ulcers. Both appear to be just a little bit deeper but again we are clinging away the necrotic and dead tissue which I think is why this is progressing towards a deeper realm as opposed to improving from my measurement standpoint in that regard. Nonetheless she has been tolerating the dressing changes she absolutely hates the memory foam mattress topper that was obtained for her nonetheless I do believe this is still doing excellent as far as taking care of excess pressure in regard to the lateral malleolus regions. She in fact has no pressure injury that I see whereas in weeks past it was week by week I was constantly seeing new pressure injuries. Overall I think it has been very beneficial for her. 07/03/18; patient arrives in my clinic today. She has deep punched out areas over her bilateral lateral malleoli. The area on the right has some more depth. We spent a lot of time today talking about pressure relief for these areas. This started when her daughter asked for a prescription for a memory foam mattress. I have never written a prescription for a mattress and I don't think insurances would pay for that on an ordinary bed. In any case he came up that she has foam boots that she refuses to wear. I would suggest going to these before any other offloading issues when she is in bed. They say she is meticulous about offloading this the rest of the day 07/10/18- She is seen in follow-up evaluation for bilateral, lateral malleolus ulcers. There is no improvement in the ulcers. She has purchased and is sleeping on a memory foam  mattress/overlay, she has been using the offloading boots nightly over the past week. She has a follow up appointment with vascular medicine at the end of October, in my opinion this follow up should be expedited given her deterioration and suboptimal TBI results. We will order plain film xray of the left ankle as deeper structures are palpable; would consider having MRI, regardless of xray report(s). The ulcers will be treated with iodoflex/iodosorb, she is unable to safely change the dressings daily with santyl. 07/19/18 on evaluation today patient appears to be doing in general visually well in regard to her bilateral lateral malleolus ulcers. She has been tolerating the dressing changes without complication which is good news. With that being said we did have an x-ray performed on 07/12/18 which revealed a slight loosen see in the lateral portion of the distal left fibula which may represent artifact but underline lytic destruction or osteomyelitis could not be excluded. MRI was recommended. With that being said we can see about getting the patient scheduled for an MRI to further evaluate this area. In fact we have that scheduled currently for August 20 19,019. 07/26/18 on evaluation today patient's wound on the right lateral ankle actually appears to be doing fairly well at this point in my pinion. She has made some good progress currently. With that being said unfortunately in regard to the left lateral ankle ulcer this seems to be a little bit more problematic at this time. In fact as I further evaluated the situation she actually had bone exposed which is the first time that's been the case in the bone appear to be  necrotic. Currently I did review patient's note from Dr. Bunnie Domino office with [facility]. He stated that ABI was 1.26 on the right and 0.95 on the left with good waveforms. Her perfusion is stable not reduced from previous studies and her digital waveforms were  pretty good particularly on the right. His conclusion upon review of the note was that there was not much she could do to improve her perfusion and he felt she was adequate for wound healing. His suggestion was that she continued to see [place] and consider a synthetic skin graft if there was no underlying infection. He plans to see her back in six months or as needed. 08/01/18 on evaluation today patient appears to be doing better in regard to her right lateral ankle ulcer. Her left lateral ankle ulcer is about the same she still has bone involvement in evidence of necrosis. There does not appear to be evidence of infection at this time On the right lateral lower extremity. I have started her on the Augmentin she picked this up and started this yesterday. This is to get her through until she sees infectious disease which is scheduled for 08/12/18. 08/06/18 on evaluation today patient appears to be doing rather well considering my discussion with patient's daughter at the end of last week. The area which was marked where she had erythema seems to be improved and this is good news. With that being said overall the patient seems to be making good improvement when it comes to the overall appearance of the right lateral ankle ulcer although this has been slow she at least is coming around in this regard. Unfortunately in regard to the left lateral ankle ulcer this is osteomyelitis based on the pathology report as well is bone culture. Nonetheless we are still waiting CT scan. Unfortunately the MRI we originally ordered cannot be performed as the patient is a pacemaker which I had overlooked. Nonetheless we are working on the CT scan approval and scheduling as of now. She did go to the hospital over the weekend and was placed on IV Cefzo for a couple of days. Fortunately this seems to have improved the erythema quite significantly which is good news. There does not appear to be any evidence of worsening infection at  this time. She did have some bleeding after the last debridement therefore I did not perform any sharp debridement in regard to left lateral ankle at this point. Patient has been approved for a snap vac for the right lateral ankle. 08/14/18; the patient with wounds over her bilateral lateral malleoli. The area on the right actually looks quite good. Been using a snap back on this area. Healthy granulation and appears to be filling in. Unfortunately the area on the left is really problematic. 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 Also noted to have cellulitis. She saw Dr. Novella Olive of infectious disease today and was put on linezolid. We are able to verify this with her pharmacy. She is completed the Augmentin that she was already on. We've been using Iodoflex to this area 08/23/18 on evaluation today patient's wounds both actually appear to be doing better compared to my prior evaluations. Fortunately she showing signs of good improvement in regard to the overall wound status especially where were using the snap vac on the right. In regard to left lateral malleolus the wound bed actually appears to be much cleaner than  previously noted. I do not feel any phone directly probed during evaluation today and though there is tendon noted this does not appear to be necrotic it's actually fairly good as far as the overall appearance of the tendon is concerned. In general the wound bed actually appears to be doing significantly better than it was previous. Patient is currently in the care of [name] and I did review that note today. He actually has her on two weeks of linezolid and then following the patient will be on 1-2 months of Keflex. That is the plan currently. She has been on antibiotics therapy as prescribed by myself initially starting on July 30, 2018 and has been on that continuously up to this point. 08/30/18 on  evaluation today patient actually appears to be doing much better in regard to her right lateral malleolus ulcer. She has been tolerating the dressing changes specifically the snap vac without complication although she did have some issues with the seal currently. Apparently there was some trouble with getting it to maintain over the past week past Sunday. Nonetheless overall the wound appears better in regard to the right lateral malleolus region. In regard to left lateral malleolus this actually show some signs of additional granulation although there still tendon noted in the base of the wound this appears to be healthy not necrotic in any way whatsoever. We are considering potentially using a snap vac for the left lateral malleolus as well the product wrap from [employer], [employer], was present in the clinic today we're going to see this patient I did have her come in with me after obtaining consent from the patient and her daughter in order to look at the wound and see if there's any recommendation one way or another as to whether or not they felt the snapback could be beneficial for the left lateral malleolus region. But AMALIYA, WHITELAW ([id number]) the conclusion was that it might be but that this is definitely a little bit deeper wound than what traditionally would be utilized for a snap vac. 09/06/18 on evaluation today patient actually appears to be doing excellent in my pinion in regard to both ankle ulcers. She has been tolerating the dressing changes without complication which is great news. Specifically we have been using the snap vac. In regard to the right ankle I'm not even sure that this is going to be necessary for today and following as the wound has filled in quite nicely. In regard to the left ankle I do believe that we're seeing excellent epithelialization from the edge as well as granulation in the central portion the tendon is still exposed but there's no evidence of necrotic bone  and in general I feel like the patient has made excellent progress even compared to last week with just one week of the snap vac. 09/11/18; this is a patient who has wounds on her bilateral lateral malleoli. Initially both of these were deep stage IV wounds in the setting of chronic arterial insufficiency. She has been revascularized. As I understand think she been using snap vacs to both of these wounds however the area on the right became more superficial and currently she is only using it on the left. Using silver collagen on the right and silver collagen under the back on the left I believe 09/19/18 on evaluation today patient actually appears to be doing very well in regard to her lateral malleolus or ulcers bilaterally. She has been tolerating the dressing changes without complication. Fortunately there  does not appear to be any evidence of infection at this time. Overall I feel like she is improving in an excellent manner and I'm very pleased with the fact that everything seems to be turning towards the better for her. This has obviously been a long road. 09/27/18 on evaluation today patient actually appears to be doing very well in regard to her bilateral lateral malleolus ulcers. She has been tolerating the dressing changes without complication. Fortunately there does not appear to be any evidence of infection at this time which is also great news. No fevers, chills, nausea, or vomiting noted at this time. Overall I feel like she is doing excellent with the snap vac on the left malleolus. She had 40 mL of fluid collection over the past week. 10/04/18 on evaluation today patient actually appears to be doing well in regard to her bilateral lateral malleolus ulcers. She continues to tolerate the dressing changes without complication. One issue that I see is the snap vac on the left lateral malleolus which appears to have sealed off some fluid underlying this area and has not really allowed it to  heal to the degree that I would like to see. For that reason I did suggest at this point we may want to pack a small piece of packing strip into this region to allow it to more effectively wick out fluid. 10/11/18 in general the patient today does not feel that she has been doing very well. She's been a little bit lethargic and subsequently is having bodyaches as well according to what she tells me today. With that being said overall she has been concerned with the fact that something may be worsening although to be honest her wounds really have not been appearing poorly. She does have a new ulcer on her left heel unfortunately. This may be pressure related. Nonetheless it seems to me to have potentially started at least as a blister I do not see any evidence of deep tissue injury. In regard to the left ankle the snap vac still seems to be causing the ceiling off of the deeper part of the wound which is in turn trapping fluid. I'm not extremely pleased with the overall appearance as far as progress from last week to this week therefore I'm gonna discontinue the snap vac at this point. 10/18/18 patient unfortunately this point has not been feeling well for the past several days. She was seen by Grayland Ormond her primary care provider who is a Librarian, academic at [facility]. Subsequently she states that she's been very weak and generally feeling malaise. No fevers, chills, nausea, or vomiting noted at this time. With that being said bloodwork was performed at the PCP office on the 11th of this month which showed a white blood cell count of 10.7. This was repeated today and shows a white blood cell count of 12.4. This does show signs of worsening. Coupled with the fact that she is feeling worse and that her left ankle wound is not really showing signs of improvement I feel like this is an indication that the osteomyelitis is likely exacerbating not improving. Overall I think we may also want to  check her C-reactive protein and sedimentation rate. Actually did call [facility] office this afternoon while the patient was in the office here with me. Subsequently based on the findings we discussed treatment possibilities and I think that it is appropriate for [place] to go ahead and initiate treatment with doxycycline which I'm going to  do. Subsequently he did agree to see about adding a CRP and sedimentation rate to her orders. If that has not already been drawn to where they can run it they will contact the patient she can come back to have that check. They are in agreement with plan as far as the patient and her daughter are concerned. Nonetheless also think we need to get in touch with Dr. Henreitta Leber office to see about getting the patient scheduled with him as soon as possible. 11/08/18 on evaluation today patient presents for follow-up concerning her bilateral foot and ankle ulcers. I did do an extensive review of her chart in epic today. Subsequently she was seen by [name] he did initiate Cefepime IV antibiotic therapy. Subsequently she had some issues with her PICC line this had to be removed because it was coiled and then replaced. Fortunately that was now settled. Unfortunately she has continued have issues with her left heel as well as the issues that she is experiencing with her bilateral lateral malleolus regions. I do believe however both areas seem to be doing a little bit better on evaluation today which is good news. No fevers, chills, nausea, or vomiting noted at this time. She actually has an angiogram schedule with Dr. dew on this coming Monday, November 11, 2018. Subsequently the patient states that she is feeling much better especially than what she was roughly 2 weeks ago. She actually had to cancel an appointment because she was feeling so poorly. No fevers, chills, nausea, or vomiting noted at this time. 11/15/18 on evaluation today patient actually is status post having  had her angiogram with Dr. dew Monday, four days ago. 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 He had to go and work on several areas of the vasculature fortunately he was able to obtain no more than a 30% residual stenosis throughout post procedure. I reviewed this note today. I think this will definitely help with healing at this time. Fortunately there does not appear to be any signs of infection and I do feel like ratio already has a better appearance to it. 11/22/18 upon evaluation today patient actually appears to be doing very well in regard to her wounds in general. The right lateral malleolus looks excellent the heel looks better in the left lateral malleolus also appears to be doing a little better. With that being said the right second toe actually appears to be open and training we been watching this is been dry and stable but now is open. 12/03/2018 Seen today for follow-up and management of multiple bilateral lower extremity wounds. New pressure injury of the great toe which is closed at this time. Wound of the right distal second toe appears larger today with deep undermining and a pocket of fluid present within the undermining region. Left and right malleolus is wounds are stable today with no signs and symptoms of infection.Denies any needs or concerns during exam today. 12/13/18 on evaluation today patient appears to be doing somewhat better in regard to her left heel ulcer. She also seems to be completely healed in regard to the right lateral malleolus ulcer. The left malleolus ulcer is smaller what unfortunately the wounds which are new over the first and second toes of the right foot are what are most concerning at this point especially the second. Both areas did require sharp debridement today. 12/20/18 on evaluation today patient's wound actually appears to be doing better in regard to  left lateral ankle and her right lateral ankle continues to remain  healed. The hill ulcer on the left is improved. She does have improvement noted as well in regard to both toe ulcers. Overall I'm very pleased in this regard. No fevers, chills, nausea, or vomiting noted at this time. JOHNATHAN, TORTORELLI ([id number]) 12/23/18 on evaluation today patient is seen after she had her toenails trimmed at the podiatrist office due to issues with her right great toe. There was what appeared to be dark eschar on the surface of the wound which had her in the podiatrist concerned. Nonetheless as I remember that during the last office visit I had utilize silver nitrate of this area I was much less concerned about the situation. Subsequently I was able to clean off much of this tissue without any complication today. This does not appear to show any signs of infection and actually look somewhat better compared to last time post debridement. Her second toe on the right foot actually had callous over and there did appear still be some fluid underneath this that would require debridement today. 12/27/18 on evaluation today patient actually appears to be showing signs of improvement at all locations. Even the left lateral ankle although this is not quite as great as the other sites. Fortunately there does not appear to be any signs of infection at this time and both of her toes on the right foot seem to be showing signs of improvement which is good news and very pleased in this regard. 01/03/19 on evaluation today patient appears to be doing better for the most part in regard to her wounds in particular. There does not appear to be any evidence of infection at this time which is good news. Fortunately there is no sign of really worsening anywhere except for the right great toe which she does have what appears to be a bruise/deep tissue injury which is very superficial and already resolving. I'm not sure where this came from I questioned her extensively and she does not recall what may have  happened with this. Other than that the patient seems to be doing well even the left lateral ankle ulcer looks good and is getting smaller. 01/10/19 on evaluation today patient appears to be doing well in regard to her left heel wound and both of her toe wounds. Overall I feel like there is definitely improvement here and I'm happy in that regard. With that being said unfortunately she is having issues with the left lateral malleolus ulcer which unfortunately still has a lot of depth to it. This is gonna be a very difficult wound for [place] to be able to truly get to heal. I may want to consider some type of skin substitute to see if this would be of benefit for her. I'll discuss this with her more the next visit most likely. This was something I thought about more at the end of the visit when I was Artie out of the room and the patient had been discharged. 01/17/19 on evaluation today patient appears to be doing very well in regard to her wounds in general. She's been making excellent progress at this time. Fortunately there's no sign of infection at this time either. No fevers, chills, nausea, or vomiting noted at this time. The biggest issue is still her left lateral malleolus where it appears to be doing well and is getting smaller but still shows a small corner where this is deeper and goes down into what appears to  be the joint space. Nonetheless this is taking much longer to heal although it still looks better in smaller than previous evaluations. 01/24/19 on evaluation today patient's wounds actually appear to be doing rather well in general overall. She did require some sharp debridement in regard to the right great toe but everything else appears to be doing excellent no debridement was even necessary. No fevers, chills, nausea, or vomiting noted at this time. 01/31/19 on evaluation today patient actually appears to be doing much better in regard to her left foot wound on the heel as well as the  ankle. The right great toe appears to be a little bit worse today this had callous over and trapped a lot of fluid underneath. Fortunately there's no signs of infection at any site which is great news. 02/07/19 on evaluation today patient actually appears to be doing decently well in regard to all of her ulcers at this point. No sharp debridement was required she is a little bit of hyper granulation in regard to the left lateral ankle as well as the left heel but the hill itself is almost completely healed which is excellent news. Overall been very pleased in this regard. 02/14/19 on evaluation today patient actually appears to be doing very well in regard to her ulcers on the right first toe, left lateral malleolus, and left heel. In fact the heel is almost completely healed at this point. The patient does not show any signs of infection which is good news. Overall very pleased with how things have progressed. 04/18/19 Telehealth Evaluation During the COVID-19 National Emergency: Verbal Consent: Obtained from patient Allergies: reviewed and the active list is current. Medication changes: patient has no current medication changes. COVID-19 Screening: 1. Have you traveled internationally or on a cruise ship in the last 14 dayso No 2. Have you had contact with someone with or under investigation for COVID-19o No 3. Have you had a fever, cough, sore throat, or experiencing shortness of breatho No on evaluation today actually did have a visit with this patient through a telehealth encounter with her home health nurse. Subsequently it was noted that the patient actually appears to be doing okay in regard to her wounds both the right great toe as well as the left lateral malleolus have shown signs of improvement although this in your theme around the left lateral malleolus there eschar coverings for both locations. The question is whether or not they are actually close and whether or not home health needs  to discharge the patient or not. Nonetheless my concern is this point obviously is that without actually seeing her and being able to evaluate this directly I cannot ensure that she is completely healed which is the question that I'm being asked. 04/22/19 on evaluation today patient presents for her first evaluation since last time I saw her which was actually February 14, 2019. I did do a telehealth visit last week in which point it was questionable whether or not she may be healed and had to bring her in today for confirmation. With that being said she does seem to be doing quite well at this point which is good news. There does not appear to be any drainage in the deed I believe her wounds may be healed. Readmission: 09/04/2019 on evaluation today patient appears to be doing unfortunately somewhat more poorly in regard to her left foot ulcer secondary to a wound that began on 08/21/2019 at least when she first noticed this. Fortunately she has  not had any evidence of active infection at this time. Systemically. I also do not necessarily see any evidence of infection at the blister/wound site on the first metatarsal head plantar aspect. This almost appears to be something that may have just rubbed inappropriately causing this to breakdown. They did not want a wait too long to come in to be seen as again she had significant issues in the past with wounds that took quite a while to heal in fact it was close to 2 years. Nonetheless this does not appear to be quite that bad but again we do need to remove some of the necrotic tissue from the surface of the wound to tell exactly the extent. She does not appear to have any significant arterial disease at this point and again her last ABIs and TBI's are recorded above in the alert section her left ABI was 1.27 with a TBI of 0.72 to the right ABI 1.08 with a TBI of 0.39. Other than this the patient has been doing quite LOANA, SALVAGGIO ([id number]) well since  I last saw her and that was in May 2020. 09/11/2019 on evaluation today patient appeared to be doing very well with regard to her plantar foot ulcer on the left. In fact this appears to be almost completely healed which is awesome. That is after just 1 week of intervention. With that being said there is no signs of active infection at this time. 09/18/2019 on evaluation today patient actually appears to be doing excellent in fact she is completely healed based on what I am seeing at this point. Fortunately there is no signs of active infection at this time and overall patient is very pleased to hear that this area has healed so quickly. Readmission: 05/13/2021 upon evaluation today this patient presents for reevaluation here in the clinic. This is a wound that actually we previously took care of. She had 1 on the right ankle and the left the left turned out to be be harder due to to heal but nonetheless is doing great at this point as the right that has reopened and it was noted first just several weeks ago with a scab over it and came off in just the past few days. Fortunately there does not appear to be any obvious evidence of significant active infection at this time which is great news. No fevers, chills, nausea, vomiting, or diarrhea. 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 There does not appear to be any signs of this interfering in any way with her wound. She does have swelling we previously had compression socks for her ordered but again it does not look like she wears these on a regular basis by any means. 05/26/2021 upon evaluation today patient appears to be doing well with regard to her wound which is actually showing signs of excellent improvement. There does not appear to be any signs of active infection which is great news and overall very pleased with where things stand today. No fevers, chills, nausea, vomiting, or diarrhea. 06/02/2021  upon evaluation today patient's wound actually showing signs of excellent improvement. Fortunately there does not appear to be any signs of active infection which is great news. I think the patient is making good progress with regard to her wounds in general. 06/09/2021 upon evaluation today patient appears to be doing excellent in regard to her wounds currently. Fortunately there does not appear to be any signs of active  infection which is great news. No fevers, chills, nausea, vomiting, or diarrhea. Overall extremely pleased with where things stand today. I think the patient is making excellent progress. 06/16/2021 upon evaluation today patient appears to be doing well in regard to her wound. This is going require little bit of debridement today and that was discussed with the patient. Otherwise she seems to be doing quite well and I am actually very pleased with where things stand at this point. No fevers, chills, nausea, vomiting, or diarrhea. 06/23/2021 upon evaluation today patient appears to be doing well with regard to her wounds. She has been tolerating the dressing changes without complication. Fortunately there does not appear to be any evidence of infection and she has not had air in her home which she actually lives at an assisted living that got fixed this morning. With that being said because of that her wrap has been extremely hot and bothersome for her over the past week. 06/30/2021 upon evaluation today patient is actually making excellent progress in regard to her ankle ulcer. She has been tolerating the dressing changes without complication and overall extremely pleased with where things stand there does not appear to be any evidence of active infection which is great news. No fevers, chills, nausea, vomiting, or diarrhea. 07/07/21 upon evaluation today patients and culture on the right actually appears to be doing quite well. There does not appear to be any signs of infection and  overall very pleased with where things stand today. No fevers, chills, nausea, or vomiting noted at this time. 07/14/2021 unfortunately the patient today has some evidence of deep tissue injury and pressure getting to the ankle region. Again I am not exactly sure what is going on here but this is very similar to issues that we have had in the past. I explained to the patient that she needs to be very mindful of exactly what is happening I think sleeping in bed is probably the main issue here although there could be other culprits I am not sure what else would potentially lead to this kind of a problem for her. 07/21/2021 upon evaluation today patient's wound actually showing signs of improvement compared to last week. Fortunately there does not appear to be any signs of active infection which is great news and overall very pleased with where things stand in that regard. With that being said I do believe that she is continuing to show signs of overall of getting better although I think this is still basically about what we were 2 weeks ago due to the worsening and now improvement. 07/28/2021 upon evaluation today patient appears to be doing well with regard to her wound. She does have some slough buildup on the surface of the wound which I would have to manage today. Fortunately there is no sign of active infection at this time. No fevers, chills, nausea, vomiting, or diarrhea. 08/04/2021 upon evaluation today patient appears to be doing about the same in regard to her wound. To be perfectly honest I am beginning to be a little bit concerned about the overall appearance of the wound bed. I do think possibly taking a sample right around the margin of the wound could be beneficial for her as far as identifying anything such as an inflammatory process or to be honest even a skin tag cancer type process that may be of concern here. Fortunately there does not appear to be any evidence of active infection at this  time which is great news  she is not having any pain also great news. 08/11/2021 upon evaluation today patient appears to be doing well with regard to her wound. The good news is I did review her biopsy results and it showed some inflammatory mixed findings but nothing that appeared to be malignant which is great news. Overall this is more of a chronic venous stasis type issue which again is more what we have been treating. Nonetheless I just wanted to make sure before going forward that there was not anything more untoward going on at this point. 08/18/2021 upon evaluation today patient appears to be doing well with regard to her ankle ulcer. Fortunately there does not appear to be any signs of active infection at this time which is great overall wound is dramatically improved compared to last week. Since last week I have actually placed her on doxycycline and subsequently this is a good option as far as the findings are concerned at this point. 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 the doxycycline is doing that. There does not appear to be any evidence of active infection systemically which is great news. 08/25/2021 upon evaluation today patient appears to be doing well with regard to her wound. I feel like we are finally get back on track as far as healing is concerned I am much happier with the overall appearance today. I do think that she is tolerating the dressing changes without complication which is great news. We have been using Hydrofera Blue which I think is a good option. The good news is she is also doing great in Dendron, QUANITA BARONA. ([id number]) regard to her compression sock on the left which is a zipper compression that seems to be doing a great job keeping her edema under good control. 09/01/2021 upon evaluation today patient appears to be doing well with  regard to her wound. Infection seems to be under much better control which is great news and very pleased in that regard. Fortunately there does not appear to be any signs of infection currently. 09/08/2021 upon evaluation today patient actually appears to be making good progress in regard to her wound. She has been tolerating the dressing changes without complication. Fortunately there does not appear to be any evidence of active infection at this time which is great news as well. No fevers, chills, nausea, vomiting, or diarrhea. 09/22/2021 upon evaluation today patient appears to be doing well with regard to her wound although is very slow to heal. We have not looked into Apligraf yet I think that is something that we should see about doing. 09/29/2021 upon evaluation today patient's wound is actually showing signs of doing about the same. I am not seeing any evidence of worsening but also no significant evidence of improvement. We did gain approval for the organogenesis products all except for Apligraf as covered by her insurance. With that being said I do think that we can go ahead and proceed with the NuShield if the patient and her family in agreement of the plan I discussed that with him today she does have a 20% coinsurance which we also discussed. 10/06/2021 upon evaluation today patient appears to unfortunately be doing a little bit worse she appears to be infected based on what I am seeing. Fortunately there does not appear to be any signs of active infection at this time which is great  news. Unfortunately it does appear to be some evidence of around the wound edge indicated by way of erythema and warmth as well as redness 10/13/2021 upon evaluation today patient actually appears to be doing excellent in regard to her ankle ulcer compared to what it was. Fortunately though she does have evidence of infection, MRSA, on the culture which I did review this overall should be managed by the  antibiotic that I given her which was the doxycycline and again today this seems to be doing much better. I think were fine to go ahead and apply the NuShield today. 10/20/2021 upon evaluation today patient appears to be doing excellent in fact the NuShield seems to have done all some for her thus far. I am actually very pleased with where we stand and overall I think that she is making great progress. There is no evidence of active infection at this time. 11/23; patient presents for follow-up. She has no issues or complaints today. She denies signs of infection. She reports stability in her wound healing. 11/03/2021 upon evaluation today patient appears to be doing well with regard to her wounds. She has been tolerating the dressing changes without complication. Fortunately there is no signs of active infection at this time. 11/10/2021 upon evaluation today patient appears to be doing well with regard to her right ankle which is actually showing signs of improvement with the NuShield I am very pleased. Subsequently in regards to the left ankle this appears to be doing okay with no evidence of issue here either. With that being said she has a significant contusion on the left leg from having fallen 2 days ago. She tells me that she was using her walker going to the closet and then when she got to the closet turned around to get something out at which point she fell according to the story. Nonetheless she does have a hematoma just below her knee on the anterior portion of her shin. My hope is that this will not open until wound although we do need I think Some compression over it also think that we need to have her use an ice as well to help with the swelling and prevent this from getting worse. The last thing she needs is a wound opened up here. 11/17/2021 upon evaluation patient's right ankle actually showing signs of improvement based on what I see currently there is a lot of new skin growth coming  in which is great news. Nonetheless I do feel like that the patient is showing improvement as well in regard to her left leg with the use of the Tubigrip it swollen but not as bruised as what I would have expected after what I was seeing last week. Nonetheless I do think doubling up on the Tubigrip would probably be beneficial to try to keep some of the edema under better control here. 11/24/2021 upon evaluation today patient appears to be doing excellent in regard to her wound. This is actually measuring significantly smaller which is great news. Fortunately I do not see any signs of active infection locally nor systemically at this point. Overall I am very pleased with how the NuShield is doing. 12/30; wound bed looks healthy however not much change in overall wound volume. We applied Nushield again today in the standard fashion 12/08/2021 upon evaluation today patient's wound actually showing signs of good improvement and I am actually very pleased with where we stand today as well. I do not see any evidence of active infection  locally nor systemically which is great news. Unfortunately she has been having some issues with stomach upset and diarrhea today. 12/15/2021 upon evaluation today patient appears to be doing excellent in regard to her wound. There is a little bit of dry skin raised up around the edges of the wound but fortunately nothing too significant at this point. Fortunately I do not see any evidence of active infection either which is great news. No fevers, chills, nausea, vomiting, or diarrhea. 12/29/2021 upon evaluation today patient appears to be doing well with regard to her wound. In general I feel like she is getting very close to complete resolution. I think that she is making good progress here as well. Overall I do not see any signs of infection and very little of this appears to actually be open. 01/12/2022 upon evaluation today patient appears to be doing a little bit worse in  regard to her wound. It appears that the collagen actually trapped fluid underneath the collagen which got hard and subsequently caused the fluid to back up. This patient has made the wound appear to be larger than what it was previous. With that being said I do not see any signs of obvious infection is not warm to touch and not significantly erythematous either which is good news. Nonetheless I do think that we will need to continue to keep an eye on this. Probably not go put on antibiotic this week but I will be too far from doing so she is actually on a Z-Pak right now I do not want to really double up on antibiotics. 01/26/2022 upon evaluation today patient's wound is showing signs of improvement although this is slow to turn back around. Fortunately there does not appear to be any evidence of active infection locally or systemically which is great news and overall I am extremely pleased with where we stand today. No fevers, chills, nausea, vomiting, or diarrhea. 02/02/2022 upon evaluation today patient appears to be doing well with regard to her wound. She has been tolerating the dressing changes without complication. Fortunately there does not appear to be any signs of active infection locally nor systemically at this time which is great news. No fevers, chills, nausea, vomiting, or diarrhea. EDDY, TERMINE ([id number]) 3/9; patient presents for follow-up. She has no issues or complaints today. She denies signs of infection. 02/16/2022 upon evaluation today patient appears to be doing well with regard to her wound. She has been tolerating the dressing changes without complication. Fortunately I do not see any evidence of active infection locally nor systemically at this point which is great news. Overall I think that the patient is making excellent progress in general. The wound is measuring smaller and though there does appear to be some need for sharp debridement today this is minimal  compared to what we have noted previous. 02/23/2022 upon evaluation today patient appears to be doing well with regard to her wound she is definitely showing signs of improvement which is great news. 03/02/2022 upon evaluation today patient appears to be doing about the same in regard to her wound. Unfortunately were not seeing significant improvement. With that being said is also not significantly worse which is great news. No fevers, chills, nausea, vomiting, or diarrhea. 03-09-2022 upon evaluation today patient appears to be doing well with regard to her wound. She has been tolerating the dressing changes without complication. Fortunately there does not appear to be any signs of active infection locally or systemically which is great news.  03-16-2022 upon evaluation today patient appears to be doing well with regard to her wound this is measuring smaller and looking much better. I am actually very pleased with where we stand and I think that the patient is making great progress. I do not see any evidence of active infection locally or systemically which is great news. No fevers, chills, nausea, vomiting, or diarrhea. 03-23-2022 upon evaluation today patient appears to be doing better in regard to her wounds. In fact the wound area actually is showing signs of excellent improvement and actually very pleased with where we stand today. I do not see any evidence of active infection locally nor systemically which is great news. 4/27; comes in today with again thick callus around the wound circumference which I removed with a curette. Some debris on the surface. Overall I do not think quite as good as it was last week by description. Using endoform 04-06-2022 upon evaluation today patient appears to be doing pretty well in regard to her wound. Fortunately I do not see any evidence of active infection locally or systemically which is great news and overall I am pleased in that regard. I do feel like this is  measuring smaller postdebridement compared to where we were previous. Overall she is headed in the right direction. She does have an area that is threatening to open and looks a little irritated on the left ankle we did measure this just for the discolored area for now its not draining too much but we want to monitor and make sure nothing worsens here. 04-13-2022 upon evaluation today patient appears to be doing better in regard to her wound although this is still very slow to heal. We have reapplied for a skin substitute but have not heard anything back as of yet. Fortunately I do not see any evidence of active infection locally or systemically at this time which is great news. 04-20-2022 upon evaluation today patient's wound is actually showing signs of significant improvement which is great news. We did get approval for skin substitutes which I think is an option here for [place] but at the same time I am not even certain that this is going to be necessary especially considering how well things seem to be doing at this point. If she continues healing as we see her right now but I think were probably going to be able to avoid any need for additional skin substitute. Patient and her daughter are both in agreement with that plan. With that being said she did have a fall she has several areas of contusion fortunately nothing that is can require intervention at this point but nonetheless she did also fracture her rib which is extremely uncomfortable obviously. 04-27-2022 upon evaluation today patient appears to be doing well currently in regard to her wound on the right lateral ankle. Overall I feel like she is actually doing significantly better even compared to last week and very pleased in this regard. I think we are on the right track here. Objective Constitutional Well-nourished and well-hydrated in no acute distress. Vitals Time Taken: 10:28 AM, Height: 62 in, Weight: 150 lbs, BMI: 27.4, Temperature:  97.9 F, Pulse: 65 bpm, Respiratory Rate: 18 breaths/min, Blood Pressure: 112/67 mmHg. Respiratory normal breathing without difficulty. Psychiatric this patient is able to make decisions and demonstrates good insight into disease process. Alert and Oriented x 3. pleasant and cooperative. General Notes: Upon inspection patient's wound bed showed signs of good granulation epithelization at this point. Fortunately there does  not appear to be any evidence of active infection locally or systemically which is great news and overall very pleased with where things stand today. Integumentary (Hair, Skin) Wound #7 status is Open. Original cause of wound was Gradually Appeared. The date acquired was: 05/12/2021. The wound has been in treatment 49 weeks. The wound is located on the Right,Lateral Malleolus. The wound measures 0.4cm length x 0.5cm width x 0.2cm depth; 0.157cm^2 Huron, [name] ([id number]) area and 0.031cm^3 volume. There is Fat Layer (Subcutaneous Tissue) exposed. There is no tunneling or undermining noted. There is a medium amount of serosanguineous drainage noted. There is medium (34-66%) red granulation within the wound bed. There is a medium (34-66%) amount of necrotic tissue within the wound bed. Wound #8 status is Healed - Epithelialized. Original cause of wound was Gradually Appeared. The date acquired was: 04/03/2022. The wound has been in treatment 3 weeks. The wound is located on the Left,Lateral Ankle. The wound measures 0cm length x 0cm width x 0cm depth; 0cm^2 area and 0cm^3 volume. There is no tunneling or undermining noted. There is a none present amount of drainage noted. There is no granulation within the wound bed. There is no necrotic tissue within the wound bed. Assessment Active Problems ICD-10 Pressure ulcer of right ankle, stage 3 Lymphedema, not elsewhere classified Venous insufficiency (chronic) (peripheral) Presence of cardiac pacemaker Long term (current) use  of anticoagulants Procedures Wound #7 Pre-procedure diagnosis of Wound #7 is a Pressure Ulcer located on the Right,Lateral Malleolus . There was a Three Layer Compression Therapy Procedure by Carlene Coria, RN. Post procedure Diagnosis Wound #7: Same as Pre-Procedure Plan Follow-up Appointments: Return Appointment in 1 week. Bathing/ Shower/ Hygiene: May shower with wound dressing protected with water repellent cover or cast protector. Edema Control - Lymphedema / Segmental Compressive Device / Other: Optional: One layer of unna paste to top of compression wrap (to act as an anchor). - and at toes Patient to wear own compression stockings. Remove compression stockings every night before going to bed and put on every morning when getting up. - left leg Elevate, Exercise Daily and Avoid Standing for Long Periods of Time. Elevate legs to the level of the heart and pump ankles as often as possible Elevate leg(s) parallel to the floor when sitting. WOUND #7: - Malleolus Wound Laterality: Right, Lateral Cleanser: Soap and Water 1 x Per Week/30 Days Discharge Instructions: Gently cleanse wound with antibacterial soap, rinse and pat dry prior to dressing wounds Primary Dressing: Endoform 2x2 (in/in) 1 x Per Week/30 Days Discharge Instructions: Apply Endoform as directed Secondary Dressing: Gauze 1 x Per Week/30 Days Discharge Instructions: As directed: dry, moistened with saline or moistened with Dakins Solution Secondary Dressing: mepitil one 1 x Per Week/30 Days Compression Wrap: 3-LAYER WRAP - Profore Lite LF 3 Multilayer Compression Bandaging System 1 x Per Week/30 Days Discharge Instructions: Apply 3 multi-layer wrap as prescribed. 1. I Minna recommend that we going to continue with the wound care measures as before and the patient is in agreement with plan. This includes the use of the endoform which I think is doing great. We are using the Mepitel over top. 2. I am also going to  recommend a 3 layer compression wrap be continued which is doing awesome job for her as well. We will see patient back for reevaluation in 1 week here in the clinic. If anything worsens or changes patient will contact our office for additional recommendations. ADALYNN, CORNE ([id number]) We will see  patient back for reevaluation in 1 week here in the clinic. If anything worsens or changes patient will contact our office for additional recommendations. Electronic Signature(s) Signed: 04/27/2022 5:32:50 PM By: Worthy Keeler PA-C Entered By: Worthy Keeler on 04/27/2022 17:32:50 Wolfgang, [name] ([id number]) -------------------------------------------------------------------------------- SuperBill Details Patient Name: [name] Date of Service: 04/27/2022 Medical Record Number: [id number] Patient Account Number: [id number] Date of Birth/Sex: [birth year] (88 y.o. F) Treating RN: Carlene Coria Primary Care Provider: Adrian Prows Other Clinician: Referring Provider: Adrian Prows Treating Provider/Extender: Skipper Cliche in Treatment: 49 Diagnosis Coding ICD-10 Codes Code Description [phone] Pressure ulcer of right ankle, stage 3 I89.0 Lymphedema, not elsewhere classified I87.2 Venous insufficiency (chronic) (peripheral) Z95.0 Presence of cardiac pacemaker Z79.01 Long term (current) use of anticoagulants Facility Procedures CPT4 Code: [id number] Description: (Facility Use Only) [phone] - [place] COMPRS LWR RT LEG Modifier: Quantity: 1 Physician Procedures CPT4 Code: [id number] Description: 86761 - WC PHYS LEVEL 3 - EST PT Modifier: Quantity: 1 CPT4 Code: Description: ICD-10 Diagnosis Description 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, stage 3 I89.0 Lymphedema, not elsewhere classified I87.2 Venous insufficiency (chronic) (peripheral) Z95.0 Presence of cardiac pacemaker Modifier: Quantity: Electronic Signature(s) Signed: 04/27/2022 5:34:10 PM By: Worthy Keeler PA-C Previous Signature: 04/27/2022 11:09:41 AM Version By: Carlene Coria RN Entered By: Worthy Keeler on 04/27/2022 17:34:10

## 2022-04-27 NOTE — Progress Notes (Addendum)
[name], [name] ([id number]) Visit Report for 04/27/2022 Arrival Information Details Patient Name: [name], [name]. Date of Service: 04/27/2022 10:15 AM Medical Record Number: [id number] Patient Account Number: [id number] Date of Birth/Sex: [birth year] (88 y.o. F) Treating RN: Carlene Coria Primary Care Erina Hamme: Adrian Prows Other Clinician: Referring Preciosa Bundrick: Adrian Prows Treating Konica Stankowski/Extender: Skipper Cliche in Treatment: 23 Visit Information History Since Last Visit All ordered tests and consults were completed: No Patient Arrived: [name] Added or deleted any medications: No Arrival Time: 10:24 Any new allergies or adverse reactions: No Accompanied By: self Had a fall or experienced change in No Transfer Assistance: None activities of daily living that may affect Patient Identification Verified: Yes risk of falls: Secondary Verification Process Completed: Yes Signs or symptoms of abuse/neglect since last visito No Patient Requires Transmission-Based No Hospitalized since last visit: No Precautions: Implantable device outside of the clinic excluding No Patient Has Alerts: Yes cellular tissue based products placed in the center Patient Alerts: Patient on Blood since last visit: Thinner Has Dressing in Place as Prescribed: Yes NOT DIABETIC Pain Present Now: No ***ELIQUIS*** Electronic Signature(s) Signed: 04/27/2022 2:19:12 PM By: Carlene Coria RN Entered By: Carlene Coria on 04/27/2022 10:28:32 Fife, [name] ([id number]) -------------------------------------------------------------------------------- Clinic Level of Care Assessment Details Patient Name: [name] Date of Service: 04/27/2022 10:15 AM Medical Record Number: [id number] Patient Account Number: [id number] Date of Birth/Sex: February 07, 1923 (88 y.o. F) Treating RN: Carlene Coria Primary Care Raeford Brandenburg: Adrian Prows Other Clinician: Referring Teshawn Moan: Adrian Prows Treating Ples Trudel/Extender: Skipper Cliche in Treatment: 26 Clinic Level of Care Assessment Items TOOL 1 Quantity Score [] - Use when EandM and Procedure is performed on INITIAL visit 0 ASSESSMENTS - Nursing Assessment / Reassessment [] - General Physical Exam (combine w/ comprehensive assessment (listed just below) when performed on new 0 pt. evals) [] - 0 Comprehensive Assessment (HX, ROS, Risk Assessments, Wounds Hx, etc.) ASSESSMENTS - Wound and Skin Assessment / Reassessment [] - Dermatologic / Skin Assessment (not related to wound area) 0 ASSESSMENTS - Ostomy and/or Continence Assessment and Care [] - Incontinence Assessment and Management 0 [] - 0 Ostomy Care Assessment and Management (repouching, etc.) PROCESS - Coordination of Care [] - Simple Patient / Family Education for ongoing care 0 [] - 0 Complex (extensive) Patient / Family Education for ongoing care [] - 0 Staff obtains Programmer, systems, Records, Test Results / Process Orders [] - 0 Staff telephones HHA, Nursing Homes / Clarify orders / etc [] - 0 Routine Transfer to another Facility (non-emergent condition) [] - 0 Routine Hospital Admission (non-emergent condition) [] - 0 New Admissions / Biomedical engineer / Ordering NPWT, Apligraf, etc. [] - 0 Emergency Hospital Admission (emergent condition) PROCESS - Special Needs [] - Pediatric / Minor Patient Management 0 [] - 0 Isolation Patient Management [] - 0 Hearing / Language / Visual special needs [] - 0 Assessment of Community assistance (transportation, D/C planning, etc.) [] - 0 Additional assistance / Altered mentation [] - 0 Support Surface(s) Assessment (bed, cushion, seat, etc.) INTERVENTIONS - Miscellaneous [] - External ear exam 0 [] - 0 Patient Transfer (multiple staff / Civil Service fast streamer / Similar devices) [] - 0 Simple Staple / Suture removal (25 or less) [] - 0 Complex Staple / Suture removal (26 or more) [] - 0 Hypo/Hyperglycemic  Management (do not check if billed separately) [] - 0 Ankle / Brachial Index (ABI) - do not check if billed separately Has the patient been seen at the hospital within  the last three years: Yes Total Score: 0 Level Of Care: ____ [name] ([id number]) Electronic Signature(s) Signed: 04/27/2022 2:19:12 PM By: Carlene Coria RN Entered By: Carlene Coria on 04/27/2022 11:09:27 Bendickson, [name] ([id number]) -------------------------------------------------------------------------------- Compression Therapy Details Patient Name: [name] Date of Service: 04/27/2022 10:15 AM Medical Record Number: [id number] Patient Account Number: [id number] Date of Birth/Sex: 1923-05-18 (88 y.o. F) Treating RN: Carlene Coria Primary Care Provider: Adrian Prows Other Clinician: Referring Provider: Adrian Prows Treating Provider/Extender: Skipper Cliche in Treatment: 38 Compression Therapy Performed for Wound Assessment: Wound #7 Right,Lateral Malleolus Performed By: Jake Church, RN Compression Type: Three Layer Post Procedure Diagnosis Same as Pre-procedure Electronic Signature(s) Signed: 04/27/2022 11:09:09 AM By: Carlene Coria RN Entered By: Carlene Coria on 04/27/2022 11:09:09 Haser, [name] ([id number]) -------------------------------------------------------------------------------- Encounter Discharge Information Details Patient Name: [name] Date of Service: 04/27/2022 10:15 AM Medical Record Number: [id number] Patient Account Number: [id number] Date of Birth/Sex: 06/11/1923 (88 y.o. F) Treating RN: Carlene Coria Primary Care Provider: Adrian Prows Other Clinician: Referring Provider: Adrian Prows Treating Provider/Extender: Skipper Cliche in Treatment: 35 Encounter Discharge Information Items Discharge Condition: Stable Ambulatory Status: Walker Discharge Destination: Home Transportation: Private Auto Accompanied By:  daughter Schedule Follow-up Appointment: Yes Clinical Summary of Care: Patient Declined Electronic Signature(s) Signed: 04/27/2022 2:19:12 PM By: Carlene Coria RN Entered By: Carlene Coria on 04/27/2022 10:48:52 Raborn, [name] ([id number]) -------------------------------------------------------------------------------- Lower Extremity Assessment Details Patient Name: [name] Date of Service: 04/27/2022 10:15 AM Medical Record Number: [id number] Patient Account Number: [id number] Date of Birth/Sex: Jun 02, 1923 (88 y.o. F) Treating RN: Carlene Coria Primary Care Provider: Adrian Prows Other Clinician: Referring Provider: Adrian Prows Treating Provider/Extender: Skipper Cliche in Treatment: 49 Edema Assessment Assessed: [Left: No] [Right: No] Edema: [Left: Ye] [Right: s] Calf Left: Right: Point of Measurement: 33 cm From Medial Instep 28 cm Ankle Left: Right: Point of Measurement: 10 cm From Medial Instep 17 cm Vascular Assessment Pulses: Dorsalis Pedis Palpable: [Right:Yes] Electronic Signature(s) Signed: 04/27/2022 2:19:12 PM By: Carlene Coria RN Entered By: Carlene Coria on 04/27/2022 10:43:06 Minassian, [name] ([id number]) -------------------------------------------------------------------------------- Multi Wound Chart Details Patient Name: [name] Date of Service: 04/27/2022 10:15 AM Medical Record Number: [id number] Patient Account Number: [id number] Date of Birth/Sex: July 13, 1923 (88 y.o. F) Treating RN: Carlene Coria Primary Care Provider: Adrian Prows Other Clinician: Referring Provider: Adrian Prows Treating Provider/Extender: Skipper Cliche in Treatment: 68 Vital Signs Height(in): 85 Pulse(bpm): 45 Weight(lbs): 150 Blood Pressure(mmHg): 112/67 Body Mass Index(BMI): 27.4 Temperature(F): 97.9 Respiratory Rate(breaths/min): 18 Photos: [8:No Photos] [N/A:N/A] Wound Location: Right, Lateral Malleolus Left, Lateral  Ankle N/A Wounding Event: Gradually Appeared Gradually Appeared N/A Primary Etiology: Pressure Ulcer Arterial Insufficiency Ulcer N/A Comorbid History: Cataracts, Congestive Heart Failure, Cataracts, Congestive Heart Failure, N/A Hypertension, Peripheral Arterial Hypertension, Peripheral Arterial Disease, Osteoarthritis, Neuropathy Disease, Osteoarthritis, Neuropathy Date Acquired: 05/12/2021 04/03/2022 N/A Weeks of Treatment: 49 3 N/A Wound Status: Open Healed - Epithelialized N/A Wound Recurrence: No No N/A Measurements L x W x D (cm) 0.4x0.5x0.2 0x0x0 N/A Area (cm) : 0.157 0 N/A Volume (cm) : 0.031 0 N/A % Reduction in Area: 86.00% 100.00% N/A % Reduction in Volume: 86.20% 100.00% N/A Classification: Category/Stage III Unclassifiable N/A Exudate Amount: Medium None Present N/A Exudate Type: Serosanguineous N/A N/A Exudate Color: red, brown N/A N/A Granulation Amount: Medium (34-66%) None Present (0%) N/A Granulation Quality: Red N/A N/A Necrotic Amount: Medium (34-66%) None Present (0%) N/A Exposed Structures: Fat Layer (Subcutaneous Tissue): Fascia: No N/A Yes  Fat Layer (Subcutaneous Tissue): Fascia: No No Tendon: No Tendon: No Muscle: No Muscle: No Joint: No Joint: No Bone: No Bone: No Epithelialization: None Large (67-100%) N/A Treatment Notes Electronic Signature(s) Signed: 04/27/2022 2:19:12 PM By: Carlene Coria RN Entered By: Carlene Coria on 04/27/2022 10:43:26 Oakey, [name] ([id number]) -------------------------------------------------------------------------------- [place] Details Patient Name: [name] Date of Service: 04/27/2022 10:15 AM Medical Record Number: [id number] Patient Account Number: [id number] Date of Birth/Sex: May 11, 1923 (88 y.o. F) Treating RN: Carlene Coria Primary Care Taven Strite: Adrian Prows Other Clinician: Referring Reneka Nebergall: Adrian Prows Treating Mianna Iezzi/Extender: Skipper Cliche in Treatment:  17 Active Inactive Pressure Nursing Diagnoses: Knowledge deficit related to causes and risk factors for pressure ulcer development Knowledge deficit related to management of pressures ulcers Potential for impaired tissue integrity related to pressure, friction, moisture, and shear Goals: Patient will remain free from development of additional pressure ulcers Date Initiated: 04/06/2022 Date Inactivated: 04/13/2022 Target Resolution Date: 04/06/2022 Goal Status: Met Patient/caregiver will verbalize risk factors for pressure ulcer development Date Initiated: 04/06/2022 Target Resolution Date: 04/06/2022 Goal Status: Active Patient/caregiver will verbalize understanding of pressure ulcer management Date Initiated: 04/06/2022 Target Resolution Date: 04/06/2022 Goal Status: Active Interventions: Assess: immobility, friction, shearing, incontinence upon admission and as needed Assess offloading mechanisms upon admission and as needed Assess potential for pressure ulcer upon admission and as needed Notes: Electronic Signature(s) Signed: 04/27/2022 2:19:12 PM By: Carlene Coria RN Entered By: Carlene Coria on 04/27/2022 10:43:14 Shoemaker, [name] ([id number]) -------------------------------------------------------------------------------- Pain Assessment Details Patient Name: [name] Date of Service: 04/27/2022 10:15 AM Medical Record Number: [id number] Patient Account Number: [id number] Date of Birth/Sex: Jan 30, 1923 (88 y.o. F) Treating RN: Carlene Coria Primary Care Jurrell Royster: Adrian Prows Other Clinician: Referring Ballard Budney: Adrian Prows Treating Shunda Rabadi/Extender: Skipper Cliche in Treatment: 37 Active Problems Location of Pain Severity and Description of Pain Patient Has Paino No Site Locations Pain Management and Medication Current Pain Management: Electronic Signature(s) Signed: 04/27/2022 2:19:12 PM By: Carlene Coria RN Entered By: Carlene Coria on 04/27/2022  10:29:07 [name] ([id number]) -------------------------------------------------------------------------------- Patient/Caregiver Education Details Patient Name: [name] Date of Service: 04/27/2022 10:15 AM Medical Record Number: [id number] Patient Account Number: [id number] Date of Birth/Gender: 1923-07-16 (88 y.o. F) Treating RN: Carlene Coria Primary Care Physician: Adrian Prows Other Clinician: Referring Physician: Adrian Prows Treating Physician/Extender: Skipper Cliche in Treatment: 19 Education Assessment Education Provided To: Patient Education Topics Provided Wound/Skin Impairment: Methods: Explain/Verbal Responses: State content correctly Electronic Signature(s) Signed: 04/27/2022 2:19:12 PM By: Carlene Coria RN Entered By: Carlene Coria on 04/27/2022 10:48:18 Grosshans, [name] ([id number]) -------------------------------------------------------------------------------- Wound Assessment Details Patient Name: [name] Date of Service: 04/27/2022 10:15 AM Medical Record Number: [id number] Patient Account Number: [id number] Date of Birth/Sex: 10/21/1923 (88 y.o. F) Treating RN: Carlene Coria Primary Care Dellamae Rosamilia: Adrian Prows Other Clinician: Referring Stephinie Battisti: Adrian Prows Treating Maliea Grandmaison/Extender: Skipper Cliche in Treatment: 49 Wound Status Wound Number: 7 Primary Pressure Ulcer Etiology: Wound Location: Right, Lateral Malleolus Wound Open Wounding Event: Gradually Appeared Status: Date Acquired: 05/12/2021 Comorbid Cataracts, Congestive Heart Failure, Hypertension, Weeks Of Treatment: 49 History: Peripheral Arterial Disease, Osteoarthritis, Neuropathy Clustered Wound: No Photos Wound Measurements Length: (cm) 0.4 Width: (cm) 0.5 Depth: (cm) 0.2 Area: (cm) 0.157 Volume: (cm) 0.031 % Reduction in Area: 86% % Reduction in Volume: 86.2% Epithelialization: None Tunneling: No Undermining: No Wound  Description Classification: Category/Stage III Exudate Amount: Medium Exudate Type: Serosanguineous Exudate Color: red, brown Foul Odor After Cleansing: No Slough/Fibrino Yes Wound Bed Granulation  Amount: Medium (34-66%) Exposed Structure Granulation Quality: Red Fascia Exposed: No Necrotic Amount: Medium (34-66%) Fat Layer (Subcutaneous Tissue) Exposed: Yes Tendon Exposed: No Muscle Exposed: No Joint Exposed: No Bone Exposed: No Treatment Notes Wound #7 (Malleolus) Wound Laterality: Right, Lateral Cleanser Soap and Water Discharge Instruction: Gently cleanse wound with antibacterial soap, rinse and pat dry prior to dressing wounds Peri-Wound Care Firmin, [name] ([id number]) Topical Primary Dressing Endoform 2x2 (in/in) Discharge Instruction: Apply Endoform as directed Secondary Dressing Gauze Discharge Instruction: As directed: dry, moistened with saline or moistened with Dakins Solution mepitil one Secured With Compression Wrap 3-LAYER WRAP - Profore Lite LF 3 Multilayer Compression [place] Discharge Instruction: Apply 3 multi-layer wrap as prescribed. Compression Stockings Add-Ons Electronic Signature(s) Signed: 04/27/2022 2:19:12 PM By: Carlene Coria RN Entered By: Carlene Coria on 04/27/2022 10:42:07 Berquist, [name] ([id number]) -------------------------------------------------------------------------------- Wound Assessment Details Patient Name: [name] Date of Service: 04/27/2022 10:15 AM Medical Record Number: [id number] Patient Account Number: [id number] Date of Birth/Sex: 02-18-1923 (88 y.o. F) Treating RN: Carlene Coria Primary Care Allenmichael Mcpartlin: Adrian Prows Other Clinician: Referring Zekiah Caruth: Adrian Prows Treating Symiah Nowotny/Extender: Skipper Cliche in Treatment: 11 Wound Status Wound Number: 8 Primary Arterial Insufficiency Ulcer Etiology: Wound Location: Left, Lateral Ankle Wound Healed - Epithelialized Wounding  Event: Gradually Appeared Status: Date Acquired: 04/03/2022 Comorbid Cataracts, Congestive Heart Failure, Hypertension, Weeks Of Treatment: 3 History: Peripheral Arterial Disease, Osteoarthritis, Neuropathy Clustered Wound: No Wound Measurements Length: (cm) 0 Width: (cm) 0 Depth: (cm) 0 Area: (cm) 0 Volume: (cm) 0 % Reduction in Area: 100% % Reduction in Volume: 100% Epithelialization: Large (67-100%) Tunneling: No Undermining: No Wound Description Classification: Unclassifiable Exudate Amount: None Present Foul Odor After Cleansing: No Slough/Fibrino No Wound Bed Granulation Amount: None Present (0%) Exposed Structure Necrotic Amount: None Present (0%) Fascia Exposed: No Fat Layer (Subcutaneous Tissue) Exposed: No Tendon Exposed: No Muscle Exposed: No Joint Exposed: No Bone Exposed: No Treatment Notes Wound #8 (Ankle) Wound Laterality: Left, Lateral Cleanser Peri-Wound Care Topical Primary Dressing Secondary Dressing Secured With Compression Wrap Compression Stockings Add-Ons Electronic Signature(s) Signed: 04/27/2022 2:19:12 PM By: Carlene Coria RN Entered By: Carlene Coria on 04/27/2022 10:41:34 Starks, [name] ([id number]) -------------------------------------------------------------------------------- Vitals Details Patient Name: [name] Date of Service: 04/27/2022 10:15 AM Medical Record Number: [id number] Patient Account Number: [id number] Date of Birth/Sex: 1923-02-19 (88 y.o. F) Treating RN: Carlene Coria Primary Care Atasha Colebank: Adrian Prows Other Clinician: Referring Raymir Frommelt: Adrian Prows Treating Adana Marik/Extender: Skipper Cliche in Treatment: 16 Vital Signs Time Taken: 10:28 Temperature (F): 97.9 Height (in): 62 Pulse (bpm): 65 Weight (lbs): 150 Respiratory Rate (breaths/min): 18 Body Mass Index (BMI): 27.4 Blood Pressure (mmHg): 112/67 Reference Range: 80 - 120 mg / dl Electronic Signature(s) Signed: 04/27/2022  2:19:12 PM By: Carlene Coria RN Entered By: Carlene Coria on 04/27/2022 10:28:52

## 2022-05-04 ENCOUNTER — Encounter: Payer: Medicare Other | Attending: Physician Assistant | Admitting: Physician Assistant

## 2022-05-04 DIAGNOSIS — I890 Lymphedema, not elsewhere classified: Secondary | ICD-10-CM | POA: Insufficient documentation

## 2022-05-04 DIAGNOSIS — Z7901 Long term (current) use of anticoagulants: Secondary | ICD-10-CM | POA: Insufficient documentation

## 2022-05-04 DIAGNOSIS — Z950 Presence of cardiac pacemaker: Secondary | ICD-10-CM | POA: Diagnosis not present

## 2022-05-04 DIAGNOSIS — I739 Peripheral vascular disease, unspecified: Secondary | ICD-10-CM | POA: Insufficient documentation

## 2022-05-04 DIAGNOSIS — Z85828 Personal history of other malignant neoplasm of skin: Secondary | ICD-10-CM | POA: Diagnosis not present

## 2022-05-04 DIAGNOSIS — I10 Essential (primary) hypertension: Secondary | ICD-10-CM | POA: Diagnosis not present

## 2022-05-04 DIAGNOSIS — L89513 Pressure ulcer of right ankle, stage 3: Secondary | ICD-10-CM | POA: Diagnosis present

## 2022-05-04 DIAGNOSIS — G629 Polyneuropathy, unspecified: Secondary | ICD-10-CM | POA: Diagnosis not present

## 2022-05-04 DIAGNOSIS — Z95820 Peripheral vascular angioplasty status with implants and grafts: Secondary | ICD-10-CM | POA: Insufficient documentation

## 2022-05-04 DIAGNOSIS — M1990 Unspecified osteoarthritis, unspecified site: Secondary | ICD-10-CM | POA: Insufficient documentation

## 2022-05-04 DIAGNOSIS — I872 Venous insufficiency (chronic) (peripheral): Secondary | ICD-10-CM | POA: Diagnosis not present

## 2022-05-04 NOTE — Progress Notes (Signed)
[name], WISDOM ([id number]) Visit Report for 05/04/2022 Chief Complaint Document Details Patient Name: [name], [name]. Date of Service: 05/04/2022 10:15 AM Medical Record Number: [id number] Patient Account Number: [id number] Date of Birth/Sex: 1923-02-03 (88 y.o. F) Treating RN: Carlene Coria Primary Care Provider: Adrian Prows Other Clinician: Referring Provider: Adrian Prows Treating Provider/Extender: Skipper Cliche in Treatment: 31 Information Obtained from: Patient Chief Complaint Right foot ulcer Electronic Signature(s) Signed: 05/04/2022 10:28:55 AM By: Worthy Keeler PA-C Entered By: Worthy Keeler on 05/04/2022 10:28:54 [name] ([id number]) -------------------------------------------------------------------------------- Problem List Details Patient Name: [name] Date of Service: 05/04/2022 10:15 AM Medical Record Number: [id number] Patient Account Number: [id number] Date of Birth/Sex: December 27, 1922 (88 y.o. 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

## 2022-05-05 NOTE — Progress Notes (Signed)
[name], [name] ([id number]) Visit Report for 05/04/2022 Arrival Information Details Patient Name: [name], [name] Date of Service: 05/04/2022 10:15 AM Medical Record Number: [id number] Patient Account Number: [id number] Date of Birth/Sex: [birth year] (88 y.o. F) Treating RN: Carlene Coria Primary Care Aimie Wagman: Adrian Prows Other Clinician: Referring Carlisle Torgeson: Adrian Prows Treating Rosangelica Pevehouse/Extender: Skipper Cliche in Treatment: 76 Visit Information History Since Last Visit All ordered tests and consults were completed: No Patient Arrived: [name] Added or deleted any medications: No Arrival Time: 10:07 Any new allergies or adverse reactions: No Accompanied By: daughter Had a fall or experienced change in No Transfer Assistance: None activities of daily living that may affect Patient Identification Verified: Yes risk of falls: Secondary Verification Process Completed: Yes Signs or symptoms of abuse/neglect since last visito No Patient Requires Transmission-Based No Hospitalized since last visit: No Precautions: Implantable device outside of the clinic excluding No Patient Has Alerts: Yes cellular tissue based products placed in the center Patient Alerts: Patient on Blood since last visit: Thinner Has Dressing in Place as Prescribed: Yes NOT DIABETIC Has Compression in Place as Prescribed: Yes ***ELIQUIS*** Pain Present Now: No Electronic Signature(s) Signed: 05/05/2022 4:26:56 PM By: Carlene Coria RN Entered By: Carlene Coria on 05/04/2022 10:12:22 [name] ([id number]) -------------------------------------------------------------------------------- Clinic Level of Care Assessment Details Patient Name: [name] Date of Service: 05/04/2022 10:15 AM Medical Record Number: [id number] Patient Account Number: [id number] Date of Birth/Sex: [birth year] (88 y.o. F) Treating RN: Carlene Coria Primary Care Abrielle Finck: Adrian Prows Other  Clinician: Referring Drina Jobst: Adrian Prows Treating Kell Ferris/Extender: Skipper Cliche in Treatment: 15 Clinic Level of Care Assessment Items TOOL 1 Quantity Score _0  - Use when EandM and Procedure is performed on INITIAL visit 0 ASSESSMENTS - Nursing Assessment / Reassessment _1  - General Physical Exam (combine w/ comprehensive assessment (listed just below) when performed on new 0 pt. evals) _2  - 0 Comprehensive Assessment (HX, ROS, Risk Assessments, Wounds Hx, etc.) ASSESSMENTS - Wound and Skin Assessment / Reassessment _3  - Dermatologic / Skin Assessment (not related to wound area) 0 ASSESSMENTS - Ostomy and/or Continence Assessment and Care _4  - Incontinence Assessment and Management 0 _5  - 0 Ostomy Care Assessment and Management (repouching, etc.) PROCESS - Coordination of Care _6  - Simple Patient / Family Education for ongoing care 0 _7  - 0 Complex (extensive) Patient / Family Education for ongoing care _8  - 0 Staff obtains Programmer, systems, Records, Test Results / Process Orders _9  - 0 Staff telephones HHA, Nursing Homes / Clarify orders / etc _10  - 0 Routine Transfer to another Facility (non-emergent condition) _11  - 0 Routine Hospital Admission (non-emergent condition) _12  - 0 New Admissions / Biomedical engineer / Ordering NPWT, Apligraf, etc. _13  - 0 Emergency Hospital Admission (emergent condition) PROCESS - Special Needs _14  - Pediatric / Minor Patient Management 0 _15  - 0 Isolation Patient Management _16  - 0 Hearing / Language / Visual special needs _17  - 0 Assessment of Community assistance (transportation, D/C planning, etc.) _18  - 0 Additional assistance / Altered mentation _19  - 0 Support Surface(s) Assessment (bed, cushion, seat, etc.) INTERVENTIONS - Miscellaneous _20  - External ear exam 0 _21  - 0 Patient Transfer (multiple staff / Civil Service fast streamer / Similar devices) _22  - 0 Simple Staple / Suture removal (25 or less) _23  - 0 Complex Staple / Suture removal  (26 or more) _24  - 0 Hypo/Hyperglycemic Management (do not check if billed separately) _25  - 0 Ankle / Brachial Index (ABI) - do not check if billed separately Has the  patient been seen at the hospital within the last three years: Yes Total Score: 0 Level Of Care: ____ [name] ([id number]) Electronic Signature(s) Signed: 05/05/2022 4:26:56 PM By: Carlene Coria RN Entered By: Carlene Coria on 05/04/2022 10:40:50 Gau, [name] ([id number]) -------------------------------------------------------------------------------- Encounter Discharge Information Details Patient Name: [name] Date of Service: 05/04/2022 10:15 AM Medical Record Number: [id number] Patient Account Number: [id number] Date of Birth/Sex: Jan 15, 1923 (88 y.o. F) Treating RN: Carlene Coria Primary Care Kaymon Denomme: Adrian Prows Other Clinician: Referring Koji Niehoff: Adrian Prows Treating Eulogia Dismore/Extender: Skipper Cliche in Treatment: 48 Encounter Discharge Information Items Post Procedure Vitals Discharge Condition: Stable Temperature (F): 97.9 Ambulatory Status: Walker Pulse (bpm): 66 Discharge Destination: Home Respiratory Rate (breaths/min): 18 Transportation: Private Auto Blood Pressure (mmHg): 135/81 Accompanied By: daughter Schedule Follow-up Appointment: Yes Clinical Summary of Care: Electronic Signature(s) Signed: 05/05/2022 4:26:56 PM By: Carlene Coria RN Entered By: Carlene Coria on 05/04/2022 10:41:57 Salay, [name] ([id number]) -------------------------------------------------------------------------------- Lower Extremity Assessment Details Patient Name: [name] Date of Service: 05/04/2022 10:15 AM Medical Record Number: [id number] Patient Account Number: [id number] Date of Birth/Sex: 06-27-1923 (88 y.o. F) Treating RN: Carlene Coria Primary Care Sheryl Towell: Adrian Prows Other Clinician: Referring Rigel Filsinger: Adrian Prows Treating Kinzee Happel/Extender: Skipper Cliche in Treatment: 50 Edema Assessment Assessed: [Left: No] [Right: No] Edema: [Left: Ye] [Right: s] Calf Left: Right: Point of Measurement: 33 cm From Medial Instep 28 cm Ankle Left: Right: Point of Measurement: 10 cm From Medial Instep 17 cm Vascular Assessment Pulses: Dorsalis Pedis Palpable: [Right:Yes] Electronic Signature(s) Signed: 05/05/2022 4:26:56 PM By: Carlene Coria RN Entered By: Carlene Coria on 05/04/2022 10:20:23 [name] ([id number]) -------------------------------------------------------------------------------- Multi Wound Chart Details Patient Name: [name] Date of Service: 05/04/2022 10:15 AM Medical Record Number: [id number] Patient Account Number: [id number] Date of Birth/Sex: Apr 15, 1923 (88 y.o. F) Treating RN: Carlene Coria Primary Care Jaze Rodino: Adrian Prows Other Clinician: Referring Supreme Rybarczyk: Adrian Prows Treating Shenique Childers/Extender: Skipper Cliche in Treatment: 50 Vital Signs Height(in): 62 Pulse(bpm): 55 Weight(lbs): 150 Blood Pressure(mmHg): 135/81 Body Mass Index(BMI): 27.4 Temperature(F): 97.9 Respiratory Rate(breaths/min): 18 Photos: [N/A:N/A] Wound Location: Right, Lateral Malleolus N/A N/A Wounding Event: Gradually Appeared N/A N/A Primary Etiology: Pressure Ulcer N/A N/A Comorbid History: Cataracts, Congestive Heart Failure, N/A N/A Hypertension, Peripheral Arterial Disease, Osteoarthritis, Neuropathy Date Acquired: 05/12/2021 N/A N/A Weeks of Treatment: 50 N/A N/A Wound Status: Open N/A N/A Wound Recurrence: No N/A N/A Measurements L x W x D (cm) 0.4x0.5x0.2 N/A N/A Area (cm) : 0.157 N/A N/A Volume (cm) : 0.031 N/A N/A % Reduction in Area: 86.00% N/A N/A % Reduction in Volume: 86.20% N/A N/A Classification: Category/Stage III N/A N/A Exudate Amount: Medium N/A N/A Exudate Type: Serosanguineous N/A N/A Exudate Color: red, brown N/A N/A Granulation Amount: Medium (34-66%) N/A  N/A Granulation Quality: Red N/A N/A Necrotic Amount: Medium (34-66%) N/A N/A Exposed Structures: Fat Layer (Subcutaneous Tissue): N/A N/A Yes Fascia: No Tendon: No Muscle: No Joint: No Bone: No Epithelialization: None N/A N/A Treatment Notes Electronic Signature(s) Signed: 05/05/2022 4:26:56 PM By: Carlene Coria RN Entered By: Carlene Coria on 05/04/2022 10:20:59 Boord, [name] ([id number]) -------------------------------------------------------------------------------- [place] Details Patient Name: [name] Date of Service: 05/04/2022 10:15 AM Medical Record Number: [id number] Patient Account Number: [id number] Date of Birth/Sex: [birth year] (88 y.o. F) Treating RN: Carlene Coria Primary Care Weslynn Ke: Adrian Prows Other Clinician: Referring Oaklan Persons: Adrian Prows Treating Kadi Hession/Extender: Skipper Cliche in Treatment: 50 Active Inactive Pressure Nursing Diagnoses: Knowledge deficit related to causes and  risk factors for pressure ulcer development Knowledge deficit related to management of pressures ulcers Potential for impaired tissue integrity related to pressure, friction, moisture, and shear Goals: Patient will remain free from development of additional pressure ulcers Date Initiated: 04/06/2022 Date Inactivated: 04/13/2022 Target Resolution Date: 04/06/2022 Goal Status: Met Patient/caregiver will verbalize risk factors for pressure ulcer development Date Initiated: 04/06/2022 Target Resolution Date: 04/06/2022 Goal Status: Active Patient/caregiver will verbalize understanding of pressure ulcer management Date Initiated: 04/06/2022 Target Resolution Date: 04/06/2022 Goal Status: Active Interventions: Assess: immobility, friction, shearing, incontinence upon admission and as needed Assess offloading mechanisms upon admission and as needed Assess potential for pressure ulcer upon admission and as needed Notes: Electronic  Signature(s) Signed: 05/05/2022 4:26:56 PM By: Carlene Coria RN Entered By: Carlene Coria on 05/04/2022 10:20:33 [name] ([id number]) -------------------------------------------------------------------------------- Pain Assessment Details Patient Name: [name] Date of Service: 05/04/2022 10:15 AM Medical Record Number: [id number] Patient Account Number: [id number] Date of Birth/Sex: 02/05/1923 (88 y.o. F) Treating RN: Carlene Coria Primary Care Sherron Mummert: Adrian Prows Other Clinician: Referring Olubunmi Rothenberger: Adrian Prows Treating Rohith Fauth/Extender: Skipper Cliche in Treatment: 50 Active Problems Location of Pain Severity and Description of Pain Patient Has Paino No Site Locations Pain Management and Medication Current Pain Management: Electronic Signature(s) Signed: 05/05/2022 4:26:56 PM By: Carlene Coria RN Entered By: Carlene Coria on 05/04/2022 10:12:50 Banks, [name] ([id number]) -------------------------------------------------------------------------------- Patient/Caregiver Education Details Patient Name: [name] Date of Service: 05/04/2022 10:15 AM Medical Record Number: [id number] Patient Account Number: [id number] Date of Birth/Gender: 08/10/1923 (88 y.o. F) Treating RN: Carlene Coria Primary Care Physician: Adrian Prows Other Clinician: Referring Physician: Adrian Prows Treating Physician/Extender: Skipper Cliche in Treatment: 63 Education Assessment Education Provided To: Patient Education Topics Provided Wound/Skin Impairment: Methods: Explain/Verbal Responses: State content correctly Electronic Signature(s) Signed: 05/05/2022 4:26:56 PM By: Carlene Coria RN Entered By: Carlene Coria on 05/04/2022 10:41:08 Sianez, [name] ([id number]) -------------------------------------------------------------------------------- Wound Assessment Details Patient Name: [name] Date of Service: 05/04/2022 10:15  AM Medical Record Number: [id number] Patient Account Number: [id number] Date of Birth/Sex: May 09, 1923 (88 y.o. F) Treating RN: Carlene Coria Primary Care Vela Render: Adrian Prows Other Clinician: Referring Trip Cavanagh: Adrian Prows Treating Zienna Ahlin/Extender: Skipper Cliche in Treatment: 50 Wound Status Wound Number: 7 Primary Pressure Ulcer Etiology: Wound Location: Right, Lateral Malleolus Wound Open Wounding Event: Gradually Appeared Status: Date Acquired: 05/12/2021 Comorbid Cataracts, Congestive Heart Failure, Hypertension, Weeks Of Treatment: 50 History: Peripheral Arterial Disease, Osteoarthritis, Neuropathy Clustered Wound: No Photos Wound Measurements Length: (cm) 0.4 Width: (cm) 0.5 Depth: (cm) 0.2 Area: (cm) 0.157 Volume: (cm) 0.031 % Reduction in Area: 86% % Reduction in Volume: 86.2% Epithelialization: None Tunneling: No Undermining: No Wound Description Classification: Category/Stage III Exudate Amount: Medium Exudate Type: Serosanguineous Exudate Color: red, brown Foul Odor After Cleansing: No Slough/Fibrino Yes Wound Bed Granulation Amount: Medium (34-66%) Exposed Structure Granulation Quality: Red Fascia Exposed: No Necrotic Amount: Medium (34-66%) Fat Layer (Subcutaneous Tissue) Exposed: Yes Tendon Exposed: No Muscle Exposed: No Joint Exposed: No Bone Exposed: No Treatment Notes Wound #7 (Malleolus) Wound Laterality: Right, Lateral Cleanser Soap and Water Discharge Instruction: Gently cleanse wound with antibacterial soap, rinse and pat dry prior to dressing wounds Peri-Wound Care Glanz, [name] ([id number]) Topical Primary Dressing Endoform 2x2 (in/in) Discharge Instruction: Apply Endoform as directed Secondary Dressing Gauze Discharge Instruction: As directed: dry, moistened with saline or moistened with Dakins Solution mepitil one Secured With Compression Wrap 3-LAYER WRAP - Profore Lite LF 3 Multilayer Compression  Bandaging System Discharge Instruction: Apply  3 multi-layer wrap as prescribed. Compression Stockings Add-Ons Electronic Signature(s) Signed: 05/05/2022 4:26:56 PM By: Carlene Coria RN Entered By: Carlene Coria on 05/04/2022 10:19:42 Regina, [name] ([id number]) -------------------------------------------------------------------------------- Vitals Details Patient Name: [name] Date of Service: 05/04/2022 10:15 AM Medical Record Number: [id number] Patient Account Number: [id number] Date of Birth/Sex: 11/03/1923 (88 y.o. F) Treating RN: Carlene Coria Primary Care Sashay Felling: Adrian Prows Other Clinician: Referring Sonnie Pawloski: Adrian Prows Treating Jennifer Holland/Extender: Skipper Cliche in Treatment: 50 Vital Signs Time Taken: 10:12 Temperature (F): 97.9 Height (in): 62 Pulse (bpm): 66 Weight (lbs): 150 Respiratory Rate (breaths/min): 18 Body Mass Index (BMI): 27.4 Blood Pressure (mmHg): 135/81 Reference Range: 80 - 120 mg / dl Electronic Signature(s) Signed: 05/05/2022 4:26:56 PM By: Carlene Coria RN Entered By: Carlene Coria on 05/04/2022 10:12:44

## 2022-05-08 ENCOUNTER — Other Ambulatory Visit: Payer: Self-pay | Admitting: Physician Assistant

## 2022-05-08 ENCOUNTER — Ambulatory Visit
Admission: RE | Admit: 2022-05-08 | Discharge: 2022-05-08 | Disposition: A | Discharge status: Home / Self Care | Payer: Medicare Other | Source: Ambulatory Visit | Attending: Physician Assistant | Admitting: Physician Assistant

## 2022-05-08 DIAGNOSIS — S22080A Wedge compression fracture of T11-T12 vertebra, initial encounter for closed fracture: Secondary | ICD-10-CM

## 2022-05-08 DIAGNOSIS — S299XXA Unspecified injury of thorax, initial encounter: Secondary | ICD-10-CM | POA: Insufficient documentation

## 2022-05-08 DIAGNOSIS — Z8781 Personal history of (healed) traumatic fracture: Secondary | ICD-10-CM

## 2022-05-10 ENCOUNTER — Ambulatory Visit: Admission: RE | Admit: 2022-05-10 | Payer: Medicare Other | Source: Ambulatory Visit

## 2022-05-11 ENCOUNTER — Encounter: Payer: Medicare Other | Admitting: Physician Assistant

## 2022-05-11 DIAGNOSIS — L89513 Pressure ulcer of right ankle, stage 3: Secondary | ICD-10-CM | POA: Diagnosis not present

## 2022-05-11 NOTE — Progress Notes (Addendum)
DALAYAH, DEAHL ([id number]) Visit Report for 05/11/2022 Chief Complaint Document Details Patient Name: [name], [name]. Date of Service: 05/11/2022 10:15 AM Medical Record Number: [id number] Patient Account Number: [id number] Date of Birth/Sex: 11/20/1923 (88 y.o. F) Treating RN: Carlene Coria Primary Care Provider: Adrian Prows Other Clinician: Referring Provider: Adrian Prows Treating Provider/Extender: Skipper Cliche in Treatment: 30 Information Obtained from: Patient Chief Complaint Right foot ulcer Electronic Signature(s) Signed: 05/11/2022 10:37:14 AM By: Worthy Keeler PA-C Entered By: Worthy Keeler on 05/11/2022 10:37:14 Gramling, [name] ([id number]) -------------------------------------------------------------------------------- Debridement Details Patient Name: [name] Date of Service: 05/11/2022 10:15 AM Medical Record Number: [id number] Patient Account Number: [id number] Date of Birth/Sex: Apr 20, 1923 (88 y.o. F) Treating RN: Carlene Coria Primary Care Provider: Adrian Prows Other Clinician: Referring Provider: Adrian Prows Treating Provider/Extender: Skipper Cliche in Treatment: 51 Debridement Performed for Wound #7 Right,Lateral Malleolus Assessment: Performed By: Physician Tommie Sams., PA-C Debridement Type: Debridement Level of Consciousness (Pre- Awake and Alert procedure): Pre-procedure Verification/Time Out Yes - 10:40 Taken: Start Time: 10:40 Total Area Debrided (L x W): 0.5 (cm) x 0.5 (cm) = 0.25 (cm) Tissue and other material Viable, Non-Viable, Subcutaneous, Skin: Dermis , Skin: Epidermis, Biofilm debrided: Level: Skin/Subcutaneous Tissue Debridement Description: Excisional Instrument: Curette Bleeding: Minimum Hemostasis Achieved: Pressure End Time: 10:43 Procedural Pain: 0 Post Procedural Pain: 0 Response to Treatment: Procedure was tolerated well Level of Consciousness (Post- Awake and  Alert procedure): Post Debridement Measurements of Total Wound Length: (cm) 0.5 Stage: Category/Stage III Width: (cm) 0.5 Depth: (cm) 0.2 Volume: (cm) 0.039 Character of Wound/Ulcer Post Debridement: Improved Post Procedure Diagnosis Same as Pre-procedure Electronic Signature(s) Signed: 05/11/2022 4:11:43 PM By: Worthy Keeler PA-C Signed: 05/12/2022 9:45:44 AM By: Carlene Coria RN Entered By: Carlene Coria on 05/11/2022 10:44:17 Reininger, [name] ([id number]) -------------------------------------------------------------------------------- HPI Details Patient Name: [name] Date of Service: 05/11/2022 10:15 AM Medical Record Number: [id number] Patient Account Number: [id number] Date of Birth/Sex: December 01, 1923 (88 y.o. 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 Dr. Amalia Hailey the podiatrist saw her on 07/20/2017 and sent her to the wound center for possible hyperbaric oxygen therapy. past medical history of peripheral vascular disease, varicose veins, status post appendectomy, basal cell carcinoma excision from the left leg, cholecystectomy, pacemaker placement, right lower extremity angiography done by Dr. dew in March 2017 with placement of a stent. there is also note of a successful ablation of the right small saphenous vein done which was reviewed by ultrasound on 10/24/2016. the patient had a right small saphenous vein ablation done on 10/20/2016. The patient has never been a smoker. She has been seen by Dr. Corene Cornea dew the vascular surgeon who most recently saw her on 06/15/2017 for evaluation of  ongoing problems with right leg swelling. She had a lower extremity arterial duplex examination done(02/13/17) which showed patent distal right superficial femoral artery stent and above-the-knee popliteal stent without evidence of restenosis. The ABI was more than 1.3 on the right and more than 1.3 on the left. This was consistent with noncompressible arteries due to medial calcification. The right great toe pressure and PPG waveforms are within normal limits and the left great toe pressure and PPG waveforms are  decreased. he recommended she continue to wear her compression stockings and continue with elevation. She is scheduled to have a noninvasive arterial study in the near future 08/16/2017 -- had a lower extremity arterial duplex examination done which showed patent distal right superficial femoral artery stent and above-the-knee popliteal stent without evidence of restenosis. The ABI was more than 1.3 on the right and more than 1.3 on the left. This was consistent with noncompressible arteries due to medial calcification. The right great toe pressure and PPG waveforms are within normal limits and the left great toe pressure and PPG waveforms are decreased. the x-ray of the right ankle has not yet been done 08/24/2017 -- had a right ankle x-ray -- IMPRESSION:1. No fracture, bone lesion or evidence of osteomyelitis. 2. Lateral soft tissue swelling with a soft tissue ulcer. she has not yet seen the vascular surgeon for review 08/31/17 on evaluation today patient's wound appears to be showing signs of improvement. She still with her appointment with vascular in order to review her results of her vascular study and then determine if any intervention would be recommended at that time. No fevers, chills, nausea, or vomiting noted at this time. She has been tolerating the dressing changes without complication. 09/28/17 on evaluation today patient's wound appears to show signs of good improvement in  regard to the granulation tissue which is surfacing. There is still a layer of slough covering the wound and the posterior portion is still significantly deeper than the anterior nonetheless there has been some good sign of things moving towards the better. She is going to go back to Dr. dew for reevaluation to ensure her blood flow is still appropriate. That will be before her next evaluation with [place] next week. No fevers, chills, nausea, or vomiting noted at this time. Patient does have some discomfort rated to be a 3-4/10 depending on activity specifically cleansing the wound makes it worse. 10/05/2017 -- the patient was seen by Dr. Lucky Cowboy last week and noninvasive studies showed a normal right ABI with brisk triphasic waveforms consistent with no arterial insufficiency including normal digital pressures. The duplex showed a patent distal right SFA stent and the proximal SFA was also normal. He was pleased with her test and thought she should have enough of perfusion for normal wound healing. He would see her back in 6 months time. 12/21/17 on evaluation today patient appears to be doing fairly well in regard to her right lateral ankle wound. Unfortunately the main issue that she is expansion at this point is that she is having some issues with what appears to be some cellulitis in the right anterior shin. She has also been noting a little bit of uncomfortable feeling especially last night and her ankle area. I'm afraid that she made the developing a little bit of an infection. With that being said I think it is in the early stages. 12/28/17 on evaluation today patient's ankle appears to be doing excellent. She's making good progress at this point the cellulitis seems to have improved after last week's evaluation. Overall she is having no significant discomfort which is excellent news. She does have an appointment with Dr. dew on March 29, 2018 for reevaluation in regard to the stent he placed. She seems  to have excellent blood flow in the right lower extremity. 01/19/12 on evaluation today patient's wound appears to be doing very well. In fact she does not appear to require debridement at this point, there's no evidence of infection, and overall  from the standpoint of the wound she seems to be doing very well. With that being said I believe that it may be time to switch to different dressing away from the [facility] Dressing she tells me she does have a lot going on her friend actually passed away yesterday and she's also having a lot of issues with her husband this obviously is weighing heavy on her as far as your thoughts and concerns today. 01/25/18 on evaluation today patient appears to be doing fairly well in regard to her right lateral malleolus. She has been tolerating the dressing changes without complication. Overall I feel like this is definitely showing signs of improvement as far as how the overall appearance of the wound is there's also evidence of epithelium start to migrate over the granulation tissue. In general I think that she is progressing nicely as far as the wound is concerned. The only concern she really has is whether or not we can switch to every other week visits in order to avoid having as many appointments as her daughters have a difficult time getting her to her appointments as well as the patient's husband to his he is not doing very well at this point. 02/22/18 on evaluation today patient's right lateral malleolus ulcer appears to be doing great. She has been tolerating the dressing changes without complication. Overall you making excellent progress at this time. Patient is having no significant discomfort. YAEKO, FAZEKAS ([id number]) 03/15/18 on evaluation today patient appears to be doing much more poorly in regard to her right lateral ankle ulcer at this point. Unfortunately since have last seen her her husband has passed just a few days ago is obviously weighed  heavily on her her daughter also had surgery well she is with her today as usual. There does not appear to be any evidence of infection she does seem to have significant contusion/deep tissue injury to the right lateral malleolus which was not noted previous when I saw her last. It's hard to tell of exactly when this injury occurred although during the time she was spending the night in the hospital this may have been most likely. 03/22/18 on evaluation today patient appears to actually be doing very well in regard to her ulcer. She did unfortunately have a setback which was noted last week however the good news is we seem to be getting back on track and in fact the wound in the core did still have some necrotic tissue which will be addressed at this point today but in general I'm seeing signs that things are on the up and up. She is glad to hear this obviously she's been somewhat concerned that due to the how her wound digressed more recently. 03/29/18 on evaluation today patient appears to be doing fairly well in regard to her right lower extremity lateral malleolus ulcer. She unfortunately does have a new area of pressure injury over the inferior portion where the wound has opened up a little bit larger secondary to the pressure she seems to be getting. She does tell me sometimes when she sleeps at night that it actually hurts and does seem to be pushing on the area little bit more unfortunately. There does not appear to be any evidence of infection which is good news. She has been tolerating the dressing changes without complication. She also did have some bruising in the left second and third toes due to the fact that she may have bump this or injured it although she  has neuropathy so she does not feel she did move recently that may have been where this came from. Nonetheless there does not appear to be any evidence of infection at this time. 04/12/18 on evaluation today patient's wound on the right  lateral ankle actually appears to be doing a little bit better with a lot of necrotic docking tissue centrally loosening up in clearing away. However she does have the beginnings of a deep tissue injury on the left lateral malleolus likely due to the fact we've been trying offload the right as much as we have. I think she may benefit from an assistive soft device to help with offloading and it looks like they're looking at one of the doughnut conditions that wraps around the lower leg to offload which I think will definitely do a good job. With that being said I think we definitely need to address this issue on the left before it becomes a wound. Patient is not having significant pain. 04/19/18 on evaluation today patient appears to be doing excellent in regard to the progress she's made with her right lateral ankle ulcer. The left ankle region which did show evidence of a deep tissue injury seems to be resolving there's little fluid noted underneath and a blister there's nothing open at this point in time overall I feel like this is progressing nicely which is good news. She does not seem to be having significant discomfort at this point which is also good news. 04/25/18-She is here in follow up evaluation for bilateral lateral malleolar ulcers. The right lateral malleolus ulcer with pale subcutaneous tissue exposure, central area of ulcer with tendon/periosteum exposed. The left lateral malleolus ulcer now with central area of nonviable tissue, otherwise deep tissue injury. She is wearing compression wraps to the left lower extremity, she will place the right lower extremity compression wraps on when she gets home. She will be out of town over the weekend and return next week and follow-up appointment. She completed her doxycycline this morning 05/03/18 on evaluation today patient appears to be doing very well in regard to her right lateral ankle ulcer in general. At least she's showing some signs of  improvement in this regard. Unfortunately she has some additional injury to the left lateral malleolus region which appears to be new likely even over the past several days. Again this determination is based on the overall appearance. With that being said the patient is obviously frustrated about this currently. 05/10/18-She is here in follow-up evaluation for bilateral lateral malleolar ulcers. She states she has purchased offloading shoes/boots and they will arrive tomorrow. She was asked to bring them in the office at next week's appointment so her provider is aware of product being utilized. She continues to sleep on right or left side, she has been encouraged to sleep on her back. The right lateral malleolus ulcer is precariously close to peri-osteum; will order xray. The left lateral malleolus ulcer is improved. Will switch back to santyl; she will follow up next week. 05/17/18 on evaluation today patient actually appears to be doing very well in regard to her malleolus her ulcers compared to last time I saw them. She does not seem to have as much in the way of contusion at this point which is great news. With that being said she does continue to have discomfort and I do believe that she is still continuing to benefit from the offloading/pressure reducing boots that were recommended. I think this is the key to trying  to get this to heal up completely. 05/24/18 on evaluation today patient actually appears to be doing worse at this point in time unfortunately compared to her last week's evaluation. She is having really no increased pain which is good news unfortunately she does have more maceration in your theme and noted surrounding the right lateral ankle the left lateral ankle is not really is erythematous I do not see signs of the overt cellulitis on that side. Unfortunately the wounds do not seem to have shown any signs of improvement since the last evaluation. She also has significant swelling  especially on the right compared to previous some of this may be due to infection however also think that she may be served better while she has these wounds by compression wrapping versus continuing to use the Juxta-Lite for the time being. Especially with the amount of drainage that she is experiencing at this point. No fevers, chills, nausea, or vomiting noted at this time. 05/31/18 on evaluation today patient appears to actually be doing better in regard to her right lateral lower extremity ulcer specifically on the malleolus region. She has been tolerating the antibiotic without complication. With that being said she still continues to have issues but a little bit of redness although nothing like she what she was experiencing previous. She still continues to pressure to her ankle area she did get the problem on offloading boots unfortunately she will not wear them she states there too uncomfortable and she can't get in and out of the bed. Nonetheless at this point her wounds seem to be continually getting worse which is not what we want I'm getting somewhat concerned about her progress and how things are going to proceed if we do not intervene in some way shape or form. I therefore had a very lengthy conversation today about offloading yet again and even made a specific suggestion for switching her to a memory foam mattress and even gave the information for a specific one that they could look at getting if it was something that they were interested in considering. She does not want to be considered for a hospital bed air mattress although honestly insurance would not cover it that she does not have any wounds on her trunk. 06/14/18 on evaluation today both wounds over the bilateral lateral malleolus her ulcers appear to be doing better there's no evidence of pressure injury at this point. She did get the foam mattress for her bed and this does seem to have been extremely beneficial for her in my  pinion. Her daughter states that she is having difficulty getting out of bed because of how soft it is. The patient also relates this to be. Nonetheless I do feel like she's actually doing better. Unfortunately right after and around the time she was getting the mattress she also sustained a fall when she got up to go pick up the phone and ended up injuring her right elbow she has 18 sutures [address] place. We are not caring for this currently although home health is going to be taking the sutures out shortly. Nonetheless this may be something that we need to evaluate going forward. It depends on how well it has or has not healed in the end. She also recently saw an orthopedic specialist for an injection in the right shoulder just before her fall unfortunately the fall seems to have worsened her pain. 06/21/18 on evaluation today patient appears to be doing about the same in regard to her lateral  malleolus ulcers. Both appear to be just a little bit deeper but again we are clinging away the necrotic and dead tissue which I think is why this is progressing towards a deeper realm as opposed TATEM, HOLSONBACK. ([id number]) to improving from my measurement standpoint in that regard. Nonetheless she has been tolerating the dressing changes she absolutely hates the memory foam mattress topper that was obtained for her nonetheless I do believe this is still doing excellent as far as taking care of excess pressure in regard to the lateral malleolus regions. She in fact has no pressure injury that I see whereas in weeks past it was week by week I was constantly seeing new pressure injuries. Overall I think it has been very beneficial for her. 07/03/18; patient arrives in my clinic today. She has deep punched out areas over her bilateral lateral malleoli. The area on the right has some more depth. We spent a lot of time today talking about pressure relief for these areas. This started when her daughter asked for a  prescription for a memory foam mattress. I have never written a prescription for a mattress and I don't think insurances would pay for that on an ordinary bed. In any case he came up that she has foam boots that she refuses to wear. I would suggest going to these before any other offloading issues when she is in bed. They say she is meticulous about offloading this the rest of the day 07/10/18- She is seen in follow-up evaluation for bilateral, lateral malleolus ulcers. There is no improvement in the ulcers. She has purchased and is sleeping on a memory foam mattress/overlay, she has been using the offloading boots nightly over the past week. She has a follow up appointment with vascular medicine at the end of October, in my opinion this follow up should be expedited given her deterioration and suboptimal TBI results. We will order plain film xray of the left ankle as deeper structures are palpable; would consider having MRI, regardless of xray report(s). The ulcers will be treated with iodoflex/iodosorb, she is unable to safely change the dressings daily with santyl. 07/19/18 on evaluation today patient appears to be doing in general visually well in regard to her bilateral lateral malleolus ulcers. She has been tolerating the dressing changes without complication which is good news. With that being said we did have an x-ray performed on 07/12/18 which revealed a slight loosen see in the lateral portion of the distal left fibula which may represent artifact but underline lytic destruction or osteomyelitis could not be excluded. MRI was recommended. With that being said we can see about getting the patient scheduled for an MRI to further evaluate this area. In fact we have that scheduled currently for August 20 19,019. 07/26/18 on evaluation today patient's wound on the right lateral ankle actually appears to be doing fairly well at this point in my pinion. She has made some good progress currently. With  that being said unfortunately in regard to the left lateral ankle ulcer this seems to be a little bit more problematic at this time. In fact as I further evaluated the situation she actually had bone exposed which is the first time that's been the case in the bone appear to be necrotic. Currently I did review patient's note from Dr. Bunnie Domino office with [facility]. He stated that ABI was 1.26 on the right and 0.95 on the left with good waveforms. Her perfusion is stable not reduced  from previous studies and her digital waveforms were pretty good particularly on the right. His conclusion upon review of the note was that there was not much she could do to improve her perfusion and he felt she was adequate for wound healing. His suggestion was that she continued to see [place] and consider a synthetic skin graft if there was no underlying infection. He plans to see her back in six months or as needed. 08/01/18 on evaluation today patient appears to be doing better in regard to her right lateral ankle ulcer. Her left lateral ankle ulcer is about the same she still has bone involvement in evidence of necrosis. There does not appear to be evidence of infection at this time On the right lateral lower extremity. I have started her on the Augmentin she picked this up and started this yesterday. This is to get her through until she sees infectious disease which is scheduled for 08/12/18. 08/06/18 on evaluation today patient appears to be doing rather well considering my discussion with patient's daughter at the end of last week. The area which was marked where she had erythema seems to be improved and this is good news. With that being said overall the patient seems to be making good improvement when it comes to the overall appearance of the right lateral ankle ulcer although this has been slow she at least is coming around in this regard. Unfortunately in regard to the left lateral ankle ulcer this  is osteomyelitis based on the pathology report as well is bone culture. Nonetheless we are still waiting CT scan. Unfortunately the MRI we originally ordered cannot be performed as the patient is a pacemaker which I had overlooked. Nonetheless we are working on the CT scan approval and scheduling as of now. She did go to the hospital over the weekend and was placed on IV Cefzo for a couple of days. Fortunately this seems to have improved the erythema quite significantly which is good news. There does not appear to be any evidence of worsening infection at this time. She did have some bleeding after the last debridement therefore I did not perform any sharp debridement in regard to left lateral ankle at this point. Patient has been approved for a snap vac for the right lateral ankle. 08/14/18; the patient with wounds over her bilateral lateral malleoli. The area on the right actually looks quite good. Been using a snap back on this area. Healthy granulation and appears to be filling in. Unfortunately the area on the left is really problematic. 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 Also noted to have cellulitis. She saw Dr. Novella Olive of infectious disease today and was put on linezolid. We are able to verify this with her pharmacy. She is completed the Augmentin that she was already on. We've been using Iodoflex to this area 08/23/18 on evaluation today patient's wounds both actually appear to be doing better compared to my prior evaluations. Fortunately she showing signs of good improvement in regard to the overall wound status especially where were using the snap vac on the right. In regard to left lateral malleolus the wound bed actually appears to be much cleaner than previously noted. I do not feel any phone directly probed during evaluation today and though there is tendon noted this does not appear to be necrotic  it's actually fairly good as far as the overall appearance of the tendon is  concerned. In general the wound bed actually appears to be doing significantly better than it was previous. Patient is currently in the care of [name] and I did review that note today. He actually has her on two weeks of linezolid and then following the patient will be on 1-2 months of Keflex. That is the plan currently. She has been on antibiotics therapy as prescribed by myself initially starting on July 30, 2018 and has been on that continuously up to this point. 08/30/18 on evaluation today patient actually appears to be doing much better in regard to her right lateral malleolus ulcer. She has been tolerating the dressing changes specifically the snap vac without complication although she did have some issues with the seal currently. Apparently there was some trouble with getting it to maintain over the past week past Sunday. Nonetheless overall the wound appears better in regard to the right lateral malleolus region. In regard to left lateral malleolus this actually show some signs of additional granulation although there still tendon noted in the base of the wound this appears to be healthy not necrotic in any way whatsoever. We are considering potentially using a snap vac for the left lateral malleolus as well the product wrap from [employer], [employer], was present in the clinic today we're going to see this patient I did have her come in with me after obtaining consent from the patient and her daughter in order to look at the wound and see if there's any recommendation one way or another as to whether or not they felt the snapback could be beneficial for the left lateral malleolus region. But the conclusion was that it might be but that this is definitely a little bit deeper wound than what traditionally would be utilized for a snap vac. 09/06/18 on evaluation today patient actually appears to be doing excellent in my pinion  in regard to both ankle ulcers. She has been tolerating the dressing changes without complication which is great news. Specifically we have been using the snap vac. In regard to the right ankle I'm not even sure that this is going to be necessary for today and following as the wound has filled in quite nicely. In regard to the left ankle I do believe TAMMI, BOULIER ([id number]) that we're seeing excellent epithelialization from the edge as well as granulation in the central portion the tendon is still exposed but there's no evidence of necrotic bone and in general I feel like the patient has made excellent progress even compared to last week with just one week of the snap vac. 09/11/18; this is a patient who has wounds on her bilateral lateral malleoli. Initially both of these were deep stage IV wounds in the setting of chronic arterial insufficiency. She has been revascularized. As I understand think she been using snap vacs to both of these wounds however the area on the right became more superficial and currently she is only using it on the left. Using silver collagen on the right and silver collagen under the back on the left I believe 09/19/18 on evaluation today patient actually appears to be doing very well in regard to her lateral malleolus or ulcers bilaterally. She has been tolerating the dressing changes without complication. Fortunately there does not appear to be any evidence of infection at this time. Overall I feel like she is improving in an excellent manner and I'm very pleased with the fact that everything seems to be turning towards the better for her.  This has obviously been a long road. 09/27/18 on evaluation today patient actually appears to be doing very well in regard to her bilateral lateral malleolus ulcers. She has been tolerating the dressing changes without complication. Fortunately there does not appear to be any evidence of infection at this time which is also great  news. No fevers, chills, nausea, or vomiting noted at this time. Overall I feel like she is doing excellent with the snap vac on the left malleolus. She had 40 mL of fluid collection over the past week. 10/04/18 on evaluation today patient actually appears to be doing well in regard to her bilateral lateral malleolus ulcers. She continues to tolerate the dressing changes without complication. One issue that I see is the snap vac on the left lateral malleolus which appears to have sealed off some fluid underlying this area and has not really allowed it to heal to the degree that I would like to see. For that reason I did suggest at this point we may want to pack a small piece of packing strip into this region to allow it to more effectively wick out fluid. 10/11/18 in general the patient today does not feel that she has been doing very well. She's been a little bit lethargic and subsequently is having bodyaches as well according to what she tells me today. With that being said overall she has been concerned with the fact that something may be worsening although to be honest her wounds really have not been appearing poorly. She does have a new ulcer on her left heel unfortunately. This may be pressure related. Nonetheless it seems to me to have potentially started at least as a blister I do not see any evidence of deep tissue injury. In regard to the left ankle the snap vac still seems to be causing the ceiling off of the deeper part of the wound which is in turn trapping fluid. I'm not extremely pleased with the overall appearance as far as progress from last week to this week therefore I'm gonna discontinue the snap vac at this point. 10/18/18 patient unfortunately this point has not been feeling well for the past several days. She was seen by Grayland Ormond her primary care provider who is a Librarian, academic at [facility]. Subsequently she states that she's been very weak and generally feeling  malaise. No fevers, chills, nausea, or vomiting noted at this time. With that being said bloodwork was performed at the PCP office on the 11th of this month which showed a white blood cell count of 10.7. This was repeated today and shows a white blood cell count of 12.4. This does show signs of worsening. Coupled with the fact that she is feeling worse and that her left ankle wound is not really showing signs of improvement I feel like this is an indication that the osteomyelitis is likely exacerbating not improving. Overall I think we may also want to check her C-reactive protein and sedimentation rate. Actually did call [facility] office this afternoon while the patient was in the office here with me. Subsequently based on the findings we discussed treatment possibilities and I think that it is appropriate for [place] to go ahead and initiate treatment with doxycycline which I'm going to do. Subsequently he did agree to see about adding a CRP and sedimentation rate to her orders. If that has not already been drawn to where they can run it they will contact the patient she can come back to have  that check. They are in agreement with plan as far as the patient and her daughter are concerned. Nonetheless also think we need to get in touch with Dr. Henreitta Leber office to see about getting the patient scheduled with him as soon as possible. 11/08/18 on evaluation today patient presents for follow-up concerning her bilateral foot and ankle ulcers. I did do an extensive review of her chart in epic today. Subsequently she was seen by [name] he did initiate Cefepime IV antibiotic therapy. Subsequently she had some issues with her PICC line this had to be removed because it was coiled and then replaced. Fortunately that was now settled. Unfortunately she has continued have issues with her left heel as well as the issues that she is experiencing with her bilateral lateral malleolus regions. I do believe however  both areas seem to be doing a little bit better on evaluation today which is good news. No fevers, chills, nausea, or vomiting noted at this time. She actually has an angiogram schedule with Dr. dew on this coming Monday, November 11, 2018. Subsequently the patient states that she is feeling much better especially than what she was roughly 2 weeks ago. She actually had to cancel an appointment because she was feeling so poorly. No fevers, chills, nausea, or vomiting noted at this time. 11/15/18 on evaluation today patient actually is status post having had her angiogram with Dr. dew Monday, four days ago. 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 He had to go and work on several areas of the vasculature fortunately he was able to obtain no more than a 30% residual stenosis throughout post procedure. I reviewed this note today. I think this will definitely help with healing at this time. Fortunately there does not appear to be any signs of infection and I do feel like ratio already has a better appearance to it. 11/22/18 upon evaluation today patient actually appears to be doing very well in regard to her wounds in general. The right lateral malleolus looks excellent the heel looks better in the left lateral malleolus also appears to be doing a little better. With that being said the right second toe actually appears to be open and training we been watching this is been dry and stable but now is open. 12/03/2018 Seen today for follow-up and management of multiple bilateral lower extremity wounds. New pressure injury of the great toe which is closed at this time. Wound of the right distal second toe appears larger today with deep undermining and a pocket of fluid present within the undermining region. Left and right malleolus is wounds are stable today with no signs and symptoms of infection.Denies any needs or concerns during exam today. 12/13/18 on evaluation today patient  appears to be doing somewhat better in regard to her left heel ulcer. She also seems to be completely healed in regard to the right lateral malleolus ulcer. The left malleolus ulcer is smaller what unfortunately the wounds which are new over the first and second toes of the right foot are what are most concerning at this point especially the second. Both areas did require sharp debridement today. 12/20/18 on evaluation today patient's wound actually appears to be doing better in regard to left lateral ankle and her right lateral ankle continues to remain healed. The hill ulcer on the left is improved. She does have improvement noted as well in regard to both toe ulcers. Overall I'm very pleased in this regard.  No fevers, chills, nausea, or vomiting noted at this time. 12/23/18 on evaluation today patient is seen after she had her toenails trimmed at the podiatrist office due to issues with her right great toe. There was what appeared to be dark eschar on the surface of the wound which had her in the podiatrist concerned. Nonetheless as I remember that during the last office visit I had utilize silver nitrate of this area I was much less concerned about the situation. Subsequently I was able to clean off much of this tissue without any complication today. This does not appear to show any signs of infection and actually look somewhat better Slater, [name] ([id number]) compared to last time post debridement. Her second toe on the right foot actually had callous over and there did appear still be some fluid underneath this that would require debridement today. 12/27/18 on evaluation today patient actually appears to be showing signs of improvement at all locations. Even the left lateral ankle although this is not quite as great as the other sites. Fortunately there does not appear to be any signs of infection at this time and both of her toes on the right foot seem to be showing signs of improvement  which is good news and very pleased in this regard. 01/03/19 on evaluation today patient appears to be doing better for the most part in regard to her wounds in particular. There does not appear to be any evidence of infection at this time which is good news. Fortunately there is no sign of really worsening anywhere except for the right great toe which she does have what appears to be a bruise/deep tissue injury which is very superficial and already resolving. I'm not sure where this came from I questioned her extensively and she does not recall what may have happened with this. Other than that the patient seems to be doing well even the left lateral ankle ulcer looks good and is getting smaller. 01/10/19 on evaluation today patient appears to be doing well in regard to her left heel wound and both of her toe wounds. Overall I feel like there is definitely improvement here and I'm happy in that regard. With that being said unfortunately she is having issues with the left lateral malleolus ulcer which unfortunately still has a lot of depth to it. This is gonna be a very difficult wound for [place] to be able to truly get to heal. I may want to consider some type of skin substitute to see if this would be of benefit for her. I'll discuss this with her more the next visit most likely. This was something I thought about more at the end of the visit when I was Artie out of the room and the patient had been discharged. 01/17/19 on evaluation today patient appears to be doing very well in regard to her wounds in general. She's been making excellent progress at this time. Fortunately there's no sign of infection at this time either. No fevers, chills, nausea, or vomiting noted at this time. The biggest issue is still her left lateral malleolus where it appears to be doing well and is getting smaller but still shows a small corner where this is deeper and goes down into what appears to be the joint space. Nonetheless  this is taking much longer to heal although it still looks better in smaller than previous evaluations. 01/24/19 on evaluation today patient's wounds actually appear to be doing rather well in general overall. She did  require some sharp debridement in regard to the right great toe but everything else appears to be doing excellent no debridement was even necessary. No fevers, chills, nausea, or vomiting noted at this time. 01/31/19 on evaluation today patient actually appears to be doing much better in regard to her left foot wound on the heel as well as the ankle. The right great toe appears to be a little bit worse today this had callous over and trapped a lot of fluid underneath. Fortunately there's no signs of infection at any site which is great news. 02/07/19 on evaluation today patient actually appears to be doing decently well in regard to all of her ulcers at this point. No sharp debridement was required she is a little bit of hyper granulation in regard to the left lateral ankle as well as the left heel but the hill itself is almost completely healed which is excellent news. Overall been very pleased in this regard. 02/14/19 on evaluation today patient actually appears to be doing very well in regard to her ulcers on the right first toe, left lateral malleolus, and left heel. In fact the heel is almost completely healed at this point. The patient does not show any signs of infection which is good news. Overall very pleased with how things have progressed. 04/18/19 Telehealth Evaluation During the COVID-19 National Emergency: Verbal Consent: Obtained from patient Allergies: reviewed and the active list is current. Medication changes: patient has no current medication changes. COVID-19 Screening: 1. Have you traveled internationally or on a cruise ship in the last 14 dayso No 2. Have you had contact with someone with or under investigation for COVID-19o No 3. Have you had a fever, cough, sore  throat, or experiencing shortness of breatho No on evaluation today actually did have a visit with this patient through a telehealth encounter with her home health nurse. Subsequently it was noted that the patient actually appears to be doing okay in regard to her wounds both the right great toe as well as the left lateral malleolus have shown signs of improvement although this in your theme around the left lateral malleolus there eschar coverings for both locations. The question is whether or not they are actually close and whether or not home health needs to discharge the patient or not. Nonetheless my concern is this point obviously is that without actually seeing her and being able to evaluate this directly I cannot ensure that she is completely healed which is the question that I'm being asked. 04/22/19 on evaluation today patient presents for her first evaluation since last time I saw her which was actually February 14, 2019. I did do a telehealth visit last week in which point it was questionable whether or not she may be healed and had to bring her in today for confirmation. With that being said she does seem to be doing quite well at this point which is good news. There does not appear to be any drainage in the deed I believe her wounds may be healed. Readmission: 09/04/2019 on evaluation today patient appears to be doing unfortunately somewhat more poorly in regard to her left foot ulcer secondary to a wound that began on 08/21/2019 at least when she first noticed this. Fortunately she has not had any evidence of active infection at this time. Systemically. I also do not necessarily see any evidence of infection at the blister/wound site on the first metatarsal head plantar aspect. This almost appears to be something that may have  just rubbed inappropriately causing this to breakdown. They did not want a wait too long to come in to be seen as again she had significant issues in the past with  wounds that took quite a while to heal in fact it was close to 2 years. Nonetheless this does not appear to be quite that bad but again we do need to remove some of the necrotic tissue from the surface of the wound to tell exactly the extent. She does not appear to have any significant arterial disease at this point and again her last ABIs and TBI's are recorded above in the alert section her left ABI was 1.27 with a TBI of 0.72 to the right ABI 1.08 with a TBI of 0.39. Other than this the patient has been doing quite well since I last saw her and that was in May 2020. 09/11/2019 on evaluation today patient appeared to be doing very well with regard to her plantar foot ulcer on the left. In fact this appears to be almost completely healed which is awesome. That is after just 1 week of intervention. With that being said there is no signs of active infection at this time. REESA, GOTSCHALL ([id number]) 09/18/2019 on evaluation today patient actually appears to be doing excellent in fact she is completely healed based on what I am seeing at this point. Fortunately there is no signs of active infection at this time and overall patient is very pleased to hear that this area has healed so quickly. Readmission: 05/13/2021 upon evaluation today this patient presents for reevaluation here in the clinic. This is a wound that actually we previously took care of. She had 1 on the right ankle and the left the left turned out to be be harder due to to heal but nonetheless is doing great at this point as the right that has reopened and it was noted first just several weeks ago with a scab over it and came off in just the past few days. Fortunately there does not appear to be any obvious evidence of significant active infection at this time which is great news. No fevers, chills, nausea, vomiting, or diarrhea. 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 There does not  appear to be any signs of this interfering in any way with her wound. She does have swelling we previously had compression socks for her ordered but again it does not look like she wears these on a regular basis by any means. 05/26/2021 upon evaluation today patient appears to be doing well with regard to her wound which is actually showing signs of excellent improvement. There does not appear to be any signs of active infection which is great news and overall very pleased with where things stand today. No fevers, chills, nausea, vomiting, or diarrhea. 06/02/2021 upon evaluation today patient's wound actually showing signs of excellent improvement. Fortunately there does not appear to be any signs of active infection which is great news. I think the patient is making good progress with regard to her wounds in general. 06/09/2021 upon evaluation today patient appears to be doing excellent in regard to her wounds currently. Fortunately there does not appear to be any signs of active infection which is great news. No fevers, chills, nausea, vomiting, or diarrhea. Overall extremely pleased with where things stand today. I think the patient is making excellent progress. 06/16/2021 upon evaluation today patient appears to be doing well in regard to  her wound. This is going require little bit of debridement today and that was discussed with the patient. Otherwise she seems to be doing quite well and I am actually very pleased with where things stand at this point. No fevers, chills, nausea, vomiting, or diarrhea. 06/23/2021 upon evaluation today patient appears to be doing well with regard to her wounds. She has been tolerating the dressing changes without complication. Fortunately there does not appear to be any evidence of infection and she has not had air in her home which she actually lives at an assisted living that got fixed this morning. With that being said because of that her wrap has been extremely hot  and bothersome for her over the past week. 06/30/2021 upon evaluation today patient is actually making excellent progress in regard to her ankle ulcer. She has been tolerating the dressing changes without complication and overall extremely pleased with where things stand there does not appear to be any evidence of active infection which is great news. No fevers, chills, nausea, vomiting, or diarrhea. 07/07/21 upon evaluation today patients and culture on the right actually appears to be doing quite well. There does not appear to be any signs of infection and overall very pleased with where things stand today. No fevers, chills, nausea, or vomiting noted at this time. 07/14/2021 unfortunately the patient today has some evidence of deep tissue injury and pressure getting to the ankle region. Again I am not exactly sure what is going on here but this is very similar to issues that we have had in the past. I explained to the patient that she needs to be very mindful of exactly what is happening I think sleeping in bed is probably the main issue here although there could be other culprits I am not sure what else would potentially lead to this kind of a problem for her. 07/21/2021 upon evaluation today patient's wound actually showing signs of improvement compared to last week. Fortunately there does not appear to be any signs of active infection which is great news and overall very pleased with where things stand in that regard. With that being said I do believe that she is continuing to show signs of overall of getting better although I think this is still basically about what we were 2 weeks ago due to the worsening and now improvement. 07/28/2021 upon evaluation today patient appears to be doing well with regard to her wound. She does have some slough buildup on the surface of the wound which I would have to manage today. Fortunately there is no sign of active infection at this time. No fevers, chills,  nausea, vomiting, or diarrhea. 08/04/2021 upon evaluation today patient appears to be doing about the same in regard to her wound. To be perfectly honest I am beginning to be a little bit concerned about the overall appearance of the wound bed. I do think possibly taking a sample right around the margin of the wound could be beneficial for her as far as identifying anything such as an inflammatory process or to be honest even a skin tag cancer type process that may be of concern here. Fortunately there does not appear to be any evidence of active infection at this time which is great news she is not having any pain also great news. 08/11/2021 upon evaluation today patient appears to be doing well with regard to her wound. The good news is I did review her biopsy results and it showed some inflammatory mixed findings  but nothing that appeared to be malignant which is great news. Overall this is more of a chronic venous stasis type issue which again is more what we have been treating. Nonetheless I just wanted to make sure before going forward that there was not anything more untoward going on at this point. 08/18/2021 upon evaluation today patient appears to be doing well with regard to her ankle ulcer. Fortunately there does not appear to be any signs of active infection at this time which is great overall wound is dramatically improved compared to last week. Since last week I have actually placed her on doxycycline and subsequently this is a good option as far as the findings are concerned at this point. 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 the doxycycline is doing that. There does not appear to be any evidence of active infection systemically which is great news. 08/25/2021 upon evaluation today patient appears to be doing well with regard to her wound. I feel like we are  finally get back on track as far as healing is concerned I am much happier with the overall appearance today. I do think that she is tolerating the dressing changes without complication which is great news. We have been using Hydrofera Blue which I think is a good option. The good news is she is also doing great in regard to her compression sock on the left which is a zipper compression that seems to be doing a great job keeping her edema under good control. 09/01/2021 upon evaluation today patient appears to be doing well with regard to her wound. Infection seems to be under much better control which is great news and very pleased in that regard. Fortunately there does not appear to be any signs of infection currently. CHARMA, MOCARSKI ([id number]) 09/08/2021 upon evaluation today patient actually appears to be making good progress in regard to her wound. She has been tolerating the dressing changes without complication. Fortunately there does not appear to be any evidence of active infection at this time which is great news as well. No fevers, chills, nausea, vomiting, or diarrhea. 09/22/2021 upon evaluation today patient appears to be doing well with regard to her wound although is very slow to heal. We have not looked into Apligraf yet I think that is something that we should see about doing. 09/29/2021 upon evaluation today patient's wound is actually showing signs of doing about the same. I am not seeing any evidence of worsening but also no significant evidence of improvement. We did gain approval for the organogenesis products all except for Apligraf as covered by her insurance. With that being said I do think that we can go ahead and proceed with the NuShield if the patient and her family in agreement of the plan I discussed that with him today she does have a 20% coinsurance which we also discussed. 10/06/2021 upon evaluation today patient appears to unfortunately be doing a little bit  worse she appears to be infected based on what I am seeing. Fortunately there does not appear to be any signs of active infection at this time which is great news. Unfortunately it does appear to be some evidence of around the wound edge indicated by way of erythema and warmth as well as redness 10/13/2021 upon evaluation today patient actually appears to be doing excellent in regard to her  ankle ulcer compared to what it was. 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 I think were fine to go ahead and apply the NuShield today. 10/20/2021 upon evaluation today patient appears to be doing excellent in fact the NuShield seems to have done all some for her thus far. I am actually very pleased with where we stand and overall I think that she is making great progress. There is no evidence of active infection at this time. 11/23; patient presents for follow-up. She has no issues or complaints today. She denies signs of infection. She reports stability in her wound healing. 11/03/2021 upon evaluation today patient appears to be doing well with regard to her wounds. She has been tolerating the dressing changes without complication. Fortunately there is no signs of active infection at this time. 11/10/2021 upon evaluation today patient appears to be doing well with regard to her right ankle which is actually showing signs of improvement with the NuShield I am very pleased. Subsequently in regards to the left ankle this appears to be doing okay with no evidence of issue here either. With that being said she has a significant contusion on the left leg from having fallen 2 days ago. She tells me that she was using her walker going to the closet and then when she got to the closet turned around to get something out at  which point she fell according to the story. Nonetheless she does have a hematoma just below her knee on the anterior portion of her shin. My hope is that this will not open until wound although we do need I think Some compression over it also think that we need to have her use an ice as well to help with the swelling and prevent this from getting worse. The last thing she needs is a wound opened up here. 11/17/2021 upon evaluation patient's right ankle actually showing signs of improvement based on what I see currently there is a lot of new skin growth coming in which is great news. Nonetheless I do feel like that the patient is showing improvement as well in regard to her left leg with the use of the Tubigrip it swollen but not as bruised as what I would have expected after what I was seeing last week. Nonetheless I do think doubling up on the Tubigrip would probably be beneficial to try to keep some of the edema under better control here. 11/24/2021 upon evaluation today patient appears to be doing excellent in regard to her wound. This is actually measuring significantly smaller which is great news. Fortunately I do not see any signs of active infection locally nor systemically at this point. Overall I am very pleased with how the NuShield is doing. 12/30; wound bed looks healthy however not much change in overall wound volume. We applied Nushield again today in the standard fashion 12/08/2021 upon evaluation today patient's wound actually showing signs of good improvement and I am actually very pleased with where we stand today as well. I do not see any evidence of active infection locally nor systemically which is great news. Unfortunately she has been having some issues with stomach upset and diarrhea today. 12/15/2021 upon evaluation today patient appears to be doing excellent in regard to her wound. There is a little bit of  dry skin raised up around the edges of the wound but fortunately  nothing too significant at this point. Fortunately I do not see any evidence of active infection either which is great news. No fevers, chills, nausea, vomiting, or diarrhea. 12/29/2021 upon evaluation today patient appears to be doing well with regard to her wound. In general I feel like she is getting very close to complete resolution. I think that she is making good progress here as well. Overall I do not see any signs of infection and very little of this appears to actually be open. 01/12/2022 upon evaluation today patient appears to be doing a little bit worse in regard to her wound. It appears that the collagen actually trapped fluid underneath the collagen which got hard and subsequently caused the fluid to back up. This patient has made the wound appear to be larger than what it was previous. With that being said I do not see any signs of obvious infection is not warm to touch and not significantly erythematous either which is good news. Nonetheless I do think that we will need to continue to keep an eye on this. Probably not go put on antibiotic this week but I will be too far from doing so she is actually on a Z-Pak right now I do not want to really double up on antibiotics. 01/26/2022 upon evaluation today patient's wound is showing signs of improvement although this is slow to turn back around. Fortunately there does not appear to be any evidence of active infection locally or systemically which is great news and overall I am extremely pleased with where we stand today. No fevers, chills, nausea, vomiting, or diarrhea. 02/02/2022 upon evaluation today patient appears to be doing well with regard to her wound. She has been tolerating the dressing changes without complication. Fortunately there does not appear to be any signs of active infection locally nor systemically at this time which is great news. No fevers, chills, nausea, vomiting, or diarrhea. 3/9; patient presents for follow-up. She has  no issues or complaints today. She denies signs of infection. 02/16/2022 upon evaluation today patient appears to be doing well with regard to her wound. She has been tolerating the dressing changes without complication. Fortunately I do not see any evidence of active infection locally nor systemically at this point which is great news. Overall I SHENITA, TREGO ([id number]) think that the patient is making excellent progress in general. The wound is measuring smaller and though there does appear to be some need for sharp debridement today this is minimal compared to what we have noted previous. 02/23/2022 upon evaluation today patient appears to be doing well with regard to her wound she is definitely showing signs of improvement which is great news. 03/02/2022 upon evaluation today patient appears to be doing about the same in regard to her wound. Unfortunately were not seeing significant improvement. With that being said is also not significantly worse which is great news. No fevers, chills, nausea, vomiting, or diarrhea. 03-09-2022 upon evaluation today patient appears to be doing well with regard to her wound. She has been tolerating the dressing changes without complication. Fortunately there does not appear to be any signs of active infection locally or systemically which is great news. 03-16-2022 upon evaluation today patient appears to be doing well with regard to her wound this is measuring smaller and looking much better. I am actually very pleased with where we stand and I think that the patient is making great  progress. I do not see any evidence of active infection locally or systemically which is great news. No fevers, chills, nausea, vomiting, or diarrhea. 03-23-2022 upon evaluation today patient appears to be doing better in regard to her wounds. In fact the wound area actually is showing signs of excellent improvement and actually very pleased with where we stand today. I do not see any  evidence of active infection locally nor systemically which is great news. 4/27; comes in today with again thick callus around the wound circumference which I removed with a curette. Some debris on the surface. Overall I do not think quite as good as it was last week by description. Using endoform 04-06-2022 upon evaluation today patient appears to be doing pretty well in regard to her wound. Fortunately I do not see any evidence of active infection locally or systemically which is great news and overall I am pleased in that regard. I do feel like this is measuring smaller postdebridement compared to where we were previous. Overall she is headed in the right direction. She does have an area that is threatening to open and looks a little irritated on the left ankle we did measure this just for the discolored area for now its not draining too much but we want to monitor and make sure nothing worsens here. 04-13-2022 upon evaluation today patient appears to be doing better in regard to her wound although this is still very slow to heal. We have reapplied for a skin substitute but have not heard anything back as of yet. Fortunately I do not see any evidence of active infection locally or systemically at this time which is great news. 04-20-2022 upon evaluation today patient's wound is actually showing signs of significant improvement which is great news. We did get approval for skin substitutes which I think is an option here for [place] but at the same time I am not even certain that this is going to be necessary especially considering how well things seem to be doing at this point. If she continues healing as we see her right now but I think were probably going to be able to avoid any need for additional skin substitute. Patient and her daughter are both in agreement with that plan. With that being said she did have a fall she has several areas of contusion fortunately nothing that is can require intervention  at this point but nonetheless she did also fracture her rib which is extremely uncomfortable obviously. 04-27-2022 upon evaluation today patient appears to be doing well currently in regard to her wound on the right lateral ankle. Overall I feel like she is actually doing significantly better even compared to last week and very pleased in this regard. I think we are on the right track here. 05-04-2022 upon evaluation today patient appears to be doing well with regard to her wound. She is going require some sharp debridement today to clear away some of the necrotic debris. Fortunately I was able to do this quite easily without significant issue or breakdown here. There is a very small area of opening centrally but really this is pretty close to getting close. 05-11-2022 upon evaluation today patient's wound is actually showing signs of excellent improvement. I do not see any evidence of infection locally or systemically at this time. Overall I think she is very close to complete resolution with just a little bit of debridement to clear away some of the dry crusty area around the edges of the wound currently.  Electronic Signature(s) Signed: 05/11/2022 2:50:05 PM By: Worthy Keeler PA-C Entered By: Worthy Keeler on 05/11/2022 14:50:04 Hannold, [name] ([id number]) -------------------------------------------------------------------------------- Physical Exam Details Patient Name: [name] Date of Service: 05/11/2022 10:15 AM Medical Record Number: [id number] Patient Account Number: [id number] Date of Birth/Sex: 09-May-1923 (88 y.o. F) Treating RN: Carlene Coria Primary Care Provider: Adrian Prows Other Clinician: Referring Provider: Adrian Prows Treating Provider/Extender: Skipper Cliche in Treatment: 23 Constitutional Well-nourished and well-hydrated in no acute distress. Respiratory normal breathing without difficulty. Psychiatric this patient is able to make decisions and  demonstrates good insight into disease process. Alert and Oriented x 3. pleasant and cooperative. Notes Patient's wound bed showed signs of good granulation and epithelization at this point. In fact there is just a very tiny opening centrally I think were very close to complete resolution. Electronic Signature(s) Signed: 05/11/2022 2:50:33 PM By: Worthy Keeler PA-C Entered By: Worthy Keeler on 05/11/2022 14:50:33 Placeres, [name] ([id number]) -------------------------------------------------------------------------------- Physician Orders Details Patient Name: [name] Date of Service: 05/11/2022 10:15 AM Medical Record Number: [id number] Patient Account Number: [id number] Date of Birth/Sex: 10/30/1923 (88 y.o. 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 Bathing/ Shower/ Hygiene o May shower with wound dressing protected with water repellent cover or cast protector. Edema Control - Lymphedema / Segmental Compressive Device / Other o Optional: One layer of unna paste to top of compression wrap (to act as an anchor). - and at toes o Patient to wear own compression stockings. Remove compression stockings every night before going to bed and put on every morning when getting up. - left leg o Elevate, Exercise Daily and Avoid Standing for Long Periods of Time. o Elevate legs to the level of the heart and pump ankles as often as possible o Elevate leg(s) parallel to the  floor when sitting. Wound Treatment Wound #7 - Malleolus Wound Laterality: Right, Lateral Cleanser: Soap and Water 1 x Per Week/30 Days Discharge Instructions: Gently cleanse wound with antibacterial soap, rinse and pat dry prior to dressing wounds Primary Dressing: Endoform 2x2 (in/in) 1 x Per Week/30 Days Discharge Instructions: Apply Endoform as directed Secondary Dressing: Gauze 1 x Per Week/30 Days Discharge Instructions: As directed: dry, moistened with saline or moistened with Dakins Solution Secondary Dressing: mepitil one 1 x Per Week/30 Days Compression Wrap: 3-LAYER WRAP - Profore Lite LF 3 Multilayer Compression Bandaging System 1 x Per Week/30 Days Discharge Instructions: Apply 3 multi-layer wrap as prescribed. Electronic Signature(s) Signed: 05/11/2022 4:11:43 PM By: Worthy Keeler PA-C Signed: 05/12/2022 9:45:44 AM By: Carlene Coria RN Entered By: Carlene Coria on 05/11/2022 10:44:34 Marcon, [name] ([id number]) -------------------------------------------------------------------------------- Problem List Details Patient Name: [name] Date of Service: 05/11/2022 10:15 AM Medical Record Number: [id number] Patient Account Number: [id number] Date of Birth/Sex: [birth year] (88 y.o. 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, stage 3 05/13/2021 No Yes I89.0 Lymphedema, not elsewhere classified 05/13/2021  No Yes I87.2 Venous insufficiency (chronic) (peripheral) 05/13/2021 No Yes Z95.0 Presence of cardiac pacemaker 05/13/2021 No Yes Z79.01 Long term (current) use of anticoagulants 05/13/2021 No Yes Inactive Problems ICD-10 Code Description Active Date Inactive Date S80.12XA Contusion of left lower leg, initial  encounter 11/10/2021 11/10/2021 Resolved Problems Electronic Signature(s) Signed: 05/11/2022 10:36:51 AM By: Worthy Keeler PA-C Entered By: Worthy Keeler on 05/11/2022 10:36:50 Minchew, [name] ([id number]) -------------------------------------------------------------------------------- Progress Note Details Patient Name: [name] Date of Service: 05/11/2022 10:15 AM Medical Record Number: [id number] Patient Account Number: [id number] Date of Birth/Sex: 05-Mar-1923 (88 y.o. 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 Dr. Amalia Hailey the podiatrist saw her on 07/20/2017 and sent her to the wound center for possible hyperbaric oxygen therapy. past medical history of peripheral vascular disease, varicose veins, status post appendectomy, basal cell carcinoma excision from the left leg, cholecystectomy, pacemaker placement, right lower extremity angiography done by Dr. dew in March 2017 with placement of a stent. there is also note of a successful ablation of the right small saphenous vein done which was reviewed by ultrasound on 10/24/2016. the patient had a right small saphenous vein ablation done on 10/20/2016. The patient has never been a smoker. She has been seen by Dr. Corene Cornea dew the vascular surgeon who most recently saw her on 06/15/2017 for evaluation of ongoing problems with right leg swelling. She had a lower extremity arterial duplex examination done(02/13/17) which showed patent distal right superficial femoral artery stent and  above-the-knee popliteal stent without evidence of restenosis. The ABI was more than 1.3 on the right and more than 1.3 on the left. This was consistent with noncompressible arteries due to medial calcification. The right great toe pressure and PPG waveforms are within normal limits and the left great toe pressure and PPG waveforms are decreased. he recommended she continue to wear her compression stockings and continue with elevation. She is scheduled to have a noninvasive arterial study in the near future 08/16/2017 -- had a lower extremity arterial duplex examination done which showed patent distal right superficial femoral artery stent and above-the-knee popliteal stent without evidence of restenosis. The ABI was more than 1.3 on the right and more than 1.3 on the left. This was consistent with noncompressible arteries due to medial calcification. The right great toe pressure and PPG waveforms are within normal limits and the left great toe pressure and PPG waveforms are decreased. the x-ray of the right ankle has not yet been done 08/24/2017 -- had a right ankle x-ray -- IMPRESSION:1. No fracture, bone lesion or evidence of osteomyelitis. 2. Lateral soft tissue swelling with a soft tissue ulcer. she has not yet seen the vascular surgeon for review 08/31/17 on evaluation today patient's wound appears to be showing signs of improvement. She still with her appointment with vascular in order to review her results of her vascular study and then determine if any intervention would be recommended at that time. No fevers, chills, nausea, or vomiting noted at this time. She has been tolerating the dressing changes without complication. 09/28/17 on evaluation today patient's wound appears to show signs of good improvement in regard to  the granulation tissue which is surfacing. There is still a layer of slough covering the wound and the posterior portion is still significantly deeper than the anterior  nonetheless there has been some good sign of things moving towards the better. She is going to go back to Dr. dew for reevaluation to ensure her blood flow is still appropriate. That will be before her next evaluation with [place] next week. No fevers, chills, nausea, or vomiting noted at this time. Patient does have some discomfort rated to be a 3-4/10 depending on activity specifically cleansing the wound makes it worse. 10/05/2017 -- the patient was seen by Dr. Lucky Cowboy last week and noninvasive studies showed a normal right ABI with brisk triphasic waveforms consistent with no arterial insufficiency including normal digital pressures. The duplex showed a patent distal right SFA stent and the proximal SFA was also normal. He was pleased with her test and thought she should have enough of perfusion for normal wound healing. He would see her back in 6 months time. 12/21/17 on evaluation today patient appears to be doing fairly well in regard to her right lateral ankle wound. Unfortunately the main issue that she is expansion at this point is that she is having some issues with what appears to be some cellulitis in the right anterior shin. She has also been noting a little bit of uncomfortable feeling especially last night and her ankle area. I'm afraid that she made the developing a little bit of an infection. With that being said I think it is in the early stages. 12/28/17 on evaluation today patient's ankle appears to be doing excellent. She's making good progress at this point the cellulitis seems to have improved after last week's evaluation. Overall she is having no significant discomfort which is excellent news. She does have an appointment with Dr. dew on March 29, 2018 for reevaluation in regard to the stent he placed. She seems to have excellent blood flow in the right lower extremity. 01/19/12 on evaluation today patient's wound appears to be doing very well. In fact she does not appear to require  debridement at this point, there's no evidence of infection, and overall from the standpoint of the wound she seems to be doing very well. With that being said I believe that it may be time to switch to different dressing away from the [name] Dressing she tells me she does have a lot going on her friend actually passed away yesterday and she's also having a lot of issues with her husband this obviously is weighing heavy on her as far as your thoughts and concerns today. 01/25/18 on evaluation today patient appears to be doing fairly well in regard to her right lateral malleolus. She has been tolerating the dressing changes without complication. Overall I feel like this is definitely showing signs of improvement as far as how the overall appearance of the wound is there's also evidence of epithelium start to migrate over the granulation tissue. In general I think that she is progressing nicely as far as the wound is concerned. The only concern she really has is whether or not we can switch to every other week visits in order to avoid having as many LALITHA, ILYAS ([id number]) appointments as her daughters have a difficult time getting her to her appointments as well as the patient's husband to his he is not doing very well at this point. 02/22/18 on evaluation today patient's right lateral malleolus ulcer appears to be doing great. She  has been tolerating the dressing changes without complication. Overall you making excellent progress at this time. Patient is having no significant discomfort. 03/15/18 on evaluation today patient appears to be doing much more poorly in regard to her right lateral ankle ulcer at this point. Unfortunately since have last seen her her husband has passed just a few days ago is obviously weighed heavily on her her daughter also had surgery well she is with her today as usual. There does not appear to be any evidence of infection she does seem to have significant  contusion/deep tissue injury to the right lateral malleolus which was not noted previous when I saw her last. It's hard to tell of exactly when this injury occurred although during the time she was spending the night in the hospital this may have been most likely. 03/22/18 on evaluation today patient appears to actually be doing very well in regard to her ulcer. She did unfortunately have a setback which was noted last week however the good news is we seem to be getting back on track and in fact the wound in the core did still have some necrotic tissue which will be addressed at this point today but in general I'm seeing signs that things are on the up and up. She is glad to hear this obviously she's been somewhat concerned that due to the how her wound digressed more recently. 03/29/18 on evaluation today patient appears to be doing fairly well in regard to her right lower extremity lateral malleolus ulcer. She unfortunately does have a new area of pressure injury over the inferior portion where the wound has opened up a little bit larger secondary to the pressure she seems to be getting. She does tell me sometimes when she sleeps at night that it actually hurts and does seem to be pushing on the area little bit more unfortunately. There does not appear to be any evidence of infection which is good news. She has been tolerating the dressing changes without complication. She also did have some bruising in the left second and third toes due to the fact that she may have bump this or injured it although she has neuropathy so she does not feel she did move recently that may have been where this came from. Nonetheless there does not appear to be any evidence of infection at this time. 04/12/18 on evaluation today patient's wound on the right lateral ankle actually appears to be doing a little bit better with a lot of necrotic docking tissue centrally loosening up in clearing away. However she does have the  beginnings of a deep tissue injury on the left lateral malleolus likely due to the fact we've been trying offload the right as much as we have. I think she may benefit from an assistive soft device to help with offloading and it looks like they're looking at one of the doughnut conditions that wraps around the lower leg to offload which I think will definitely do a good job. With that being said I think we definitely need to address this issue on the left before it becomes a wound. Patient is not having significant pain. 04/19/18 on evaluation today patient appears to be doing excellent in regard to the progress she's made with her right lateral ankle ulcer. The left ankle region which did show evidence of a deep tissue injury seems to be resolving there's little fluid noted underneath and a blister there's nothing open at this point in time overall I feel  like this is progressing nicely which is good news. She does not seem to be having significant discomfort at this point which is also good news. 04/25/18-She is here in follow up evaluation for bilateral lateral malleolar ulcers. The right lateral malleolus ulcer with pale subcutaneous tissue exposure, central area of ulcer with tendon/periosteum exposed. The left lateral malleolus ulcer now with central area of nonviable tissue, otherwise deep tissue injury. She is wearing compression wraps to the left lower extremity, she will place the right lower extremity compression wraps on when she gets home. She will be out of town over the weekend and return next week and follow-up appointment. She completed her doxycycline this morning 05/03/18 on evaluation today patient appears to be doing very well in regard to her right lateral ankle ulcer in general. At least she's showing some signs of improvement in this regard. Unfortunately she has some additional injury to the left lateral malleolus region which appears to be new likely even over the past several  days. Again this determination is based on the overall appearance. With that being said the patient is obviously frustrated about this currently. 05/10/18-She is here in follow-up evaluation for bilateral lateral malleolar ulcers. She states she has purchased offloading shoes/boots and they will arrive tomorrow. She was asked to bring them in the office at next week's appointment so her provider is aware of product being utilized. She continues to sleep on right or left side, she has been encouraged to sleep on her back. The right lateral malleolus ulcer is precariously close to peri-osteum; will order xray. The left lateral malleolus ulcer is improved. Will switch back to santyl; she will follow up next week. 05/17/18 on evaluation today patient actually appears to be doing very well in regard to her malleolus her ulcers compared to last time I saw them. She does not seem to have as much in the way of contusion at this point which is great news. With that being said she does continue to have discomfort and I do believe that she is still continuing to benefit from the offloading/pressure reducing boots that were recommended. I think this is the key to trying to get this to heal up completely. 05/24/18 on evaluation today patient actually appears to be doing worse at this point in time unfortunately compared to her last week's evaluation. She is having really no increased pain which is good news unfortunately she does have more maceration in your theme and noted surrounding the right lateral ankle the left lateral ankle is not really is erythematous I do not see signs of the overt cellulitis on that side. Unfortunately the wounds do not seem to have shown any signs of improvement since the last evaluation. She also has significant swelling especially on the right compared to previous some of this may be due to infection however also think that she may be served better while she has these wounds by  compression wrapping versus continuing to use the Juxta-Lite for the time being. Especially with the amount of drainage that she is experiencing at this point. No fevers, chills, nausea, or vomiting noted at this time. 05/31/18 on evaluation today patient appears to actually be doing better in regard to her right lateral lower extremity ulcer specifically on the malleolus region. She has been tolerating the antibiotic without complication. With that being said she still continues to have issues but a little bit of redness although nothing like she what she was experiencing previous. She still continues to  pressure to her ankle area she did get the problem on offloading boots unfortunately she will not wear them she states there too uncomfortable and she can't get in and out of the bed. Nonetheless at this point her wounds seem to be continually getting worse which is not what we want I'm getting somewhat concerned about her progress and how things are going to proceed if we do not intervene in some way shape or form. I therefore had a very lengthy conversation today about offloading yet again and even made a specific suggestion for switching her to a memory foam mattress and even gave the information for a specific one that they could look at getting if it was something that they were interested in considering. She does not want to be considered for a hospital bed air mattress although honestly insurance would not cover it that she does not have any wounds on her trunk. 06/14/18 on evaluation today both wounds over the bilateral lateral malleolus her ulcers appear to be doing better there's no evidence of pressure injury at this point. She did get the foam mattress for her bed and this does seem to have been extremely beneficial for her in my pinion. Her daughter states that she is having difficulty getting out of bed because of how soft it is. The patient also relates this to be. Nonetheless I do  feel like she's actually doing better. Unfortunately right after and around the time she was getting the mattress she also sustained a fall when she got up to go pick up the phone and ended up injuring her right elbow she has 18 sutures in place. We are not caring for this currently although home health is going to be taking the sutures out shortly. Nonetheless this may be something that we need to evaluate going forward. It depends on QUANESHIA, WAREING ([id number]) how well it has or has not healed in the end. She also recently saw an orthopedic specialist for an injection in the right shoulder just before her fall unfortunately the fall seems to have worsened her pain. 06/21/18 on evaluation today patient appears to be doing about the same in regard to her lateral malleolus ulcers. Both appear to be just a little bit deeper but again we are clinging away the necrotic and dead tissue which I think is why this is progressing towards a deeper realm as opposed to improving from my measurement standpoint in that regard. Nonetheless she has been tolerating the dressing changes she absolutely hates the memory foam mattress topper that was obtained for her nonetheless I do believe this is still doing excellent as far as taking care of excess pressure in regard to the lateral malleolus regions. She in fact has no pressure injury that I see whereas in weeks past it was week by week I was constantly seeing new pressure injuries. Overall I think it has been very beneficial for her. 07/03/18; patient arrives in my clinic today. She has deep punched out areas over her bilateral lateral malleoli. The area on the right has some more depth. We spent a lot of time today talking about pressure relief for these areas. This started when her daughter asked for a prescription for a memory foam mattress. I have never written a prescription for a mattress and I don't think insurances would pay for that on an ordinary bed.  In any case he came up that she has foam boots that she refuses to wear. I would suggest going  to these before any other offloading issues when she is in bed. They say she is meticulous about offloading this the rest of the day 07/10/18- She is seen in follow-up evaluation for bilateral, lateral malleolus ulcers. There is no improvement in the ulcers. She has purchased and is sleeping on a memory foam mattress/overlay, she has been using the offloading boots nightly over the past week. She has a follow up appointment with vascular medicine at the end of October, in my opinion this follow up should be expedited given her deterioration and suboptimal TBI results. We will order plain film xray of the left ankle as deeper structures are palpable; would consider having MRI, regardless of xray report(s). The ulcers will be treated with iodoflex/iodosorb, she is unable to safely change the dressings daily with santyl. 07/19/18 on evaluation today patient appears to be doing in general visually well in regard to her bilateral lateral malleolus ulcers. She has been tolerating the dressing changes without complication which is good news. With that being said we did have an x-ray performed on 07/12/18 which revealed a slight loosen see in the lateral portion of the distal left fibula which may represent artifact but underline lytic destruction or osteomyelitis could not be excluded. MRI was recommended. With that being said we can see about getting the patient scheduled for an MRI to further evaluate this area. In fact we have that scheduled currently for August 20 19,019. 07/26/18 on evaluation today patient's wound on the right lateral ankle actually appears to be doing fairly well at this point in my pinion. She has made some good progress currently. With that being said unfortunately in regard to the left lateral ankle ulcer this seems to be a little bit more problematic at this time. In fact as I further  evaluated the situation she actually had bone exposed which is the first time that's been the case in the bone appear to be necrotic. Currently I did review patient's note from Dr. Bunnie Domino office with [facility]. He stated that ABI was 1.26 on the right and 0.95 on the left with good waveforms. Her perfusion is stable not reduced from previous studies and her digital waveforms were pretty good particularly on the right. His conclusion upon review of the note was that there was not much she could do to improve her perfusion and he felt she was adequate for wound healing. His suggestion was that she continued to see [place] and consider a synthetic skin graft if there was no underlying infection. He plans to see her back in six months or as needed. 08/01/18 on evaluation today patient appears to be doing better in regard to her right lateral ankle ulcer. Her left lateral ankle ulcer is about the same she still has bone involvement in evidence of necrosis. There does not appear to be evidence of infection at this time On the right lateral lower extremity. I have started her on the Augmentin she picked this up and started this yesterday. This is to get her through until she sees infectious disease which is scheduled for 08/12/18. 08/06/18 on evaluation today patient appears to be doing rather well considering my discussion with patient's daughter at the end of last week. The area which was marked where she had erythema seems to be improved and this is good news. With that being said overall the patient seems to be making good improvement when it comes to the overall appearance of the right lateral ankle ulcer  although this has been slow she at least is coming around in this regard. Unfortunately in regard to the left lateral ankle ulcer this is osteomyelitis based on the pathology report as well is bone culture. Nonetheless we are still waiting CT scan. Unfortunately the MRI we originally  ordered cannot be performed as the patient is a pacemaker which I had overlooked. Nonetheless we are working on the CT scan approval and scheduling as of now. She did go to the hospital over the weekend and was placed on IV Cefzo for a couple of days. Fortunately this seems to have improved the erythema quite significantly which is good news. There does not appear to be any evidence of worsening infection at this time. She did have some bleeding after the last debridement therefore I did not perform any sharp debridement in regard to left lateral ankle at this point. Patient has been approved for a snap vac for the right lateral ankle. 08/14/18; the patient with wounds over her bilateral lateral malleoli. The area on the right actually looks quite good. Been using a snap back on this area. Healthy granulation and appears to be filling in. Unfortunately the area on the left is really problematic. 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 Also noted to have cellulitis. She saw Dr. Novella Olive of infectious disease today and was put on linezolid. We are able to verify this with her pharmacy. She is completed the Augmentin that she was already on. We've been using Iodoflex to this area 08/23/18 on evaluation today patient's wounds both actually appear to be doing better compared to my prior evaluations. Fortunately she showing signs of good improvement in regard to the overall wound status especially where were using the snap vac on the right. In regard to left lateral malleolus the wound bed actually appears to be much cleaner than previously noted. I do not feel any phone directly probed during evaluation today and though there is tendon noted this does not appear to be necrotic it's actually fairly good as far as the overall appearance of the tendon is concerned. In general the wound bed actually appears to be doing  significantly better than it was previous. Patient is currently in the care of [name] and I did review that note today. He actually has her on two weeks of linezolid and then following the patient will be on 1-2 months of Keflex. That is the plan currently. She has been on antibiotics therapy as prescribed by myself initially starting on July 30, 2018 and has been on that continuously up to this point. 08/30/18 on evaluation today patient actually appears to be doing much better in regard to her right lateral malleolus ulcer. She has been tolerating the dressing changes specifically the snap vac without complication although she did have some issues with the seal currently. Apparently there was some trouble with getting it to maintain over the past week past Sunday. Nonetheless overall the wound appears better in regard to the right lateral malleolus region. In regard to left lateral malleolus this actually show some signs of additional granulation although there still tendon noted in the base of the wound this appears to be healthy not necrotic in any way whatsoever. We are considering potentially using a snap vac for the left lateral malleolus as well the product wrap from [employer], [place], was present in the clinic today we're going to see this patient I  did have her come in with me after obtaining consent from the patient and her daughter in order to look at the wound and see if there's any recommendation one way or another as to whether or not they felt the snapback could be beneficial for the left lateral malleolus region. But DARIANNA, AMY ([id number]) the conclusion was that it might be but that this is definitely a little bit deeper wound than what traditionally would be utilized for a snap vac. 09/06/18 on evaluation today patient actually appears to be doing excellent in my pinion in regard to both ankle ulcers. She has been tolerating the dressing changes without complication which is  great news. Specifically we have been using the snap vac. In regard to the right ankle I'm not even sure that this is going to be necessary for today and following as the wound has filled in quite nicely. In regard to the left ankle I do believe that we're seeing excellent epithelialization from the edge as well as granulation in the central portion the tendon is still exposed but there's no evidence of necrotic bone and in general I feel like the patient has made excellent progress even compared to last week with just one week of the snap vac. 09/11/18; this is a patient who has wounds on her bilateral lateral malleoli. Initially both of these were deep stage IV wounds in the setting of chronic arterial insufficiency. She has been revascularized. As I understand think she been using snap vacs to both of these wounds however the area on the right became more superficial and currently she is only using it on the left. Using silver collagen on the right and silver collagen under the back on the left I believe 09/19/18 on evaluation today patient actually appears to be doing very well in regard to her lateral malleolus or ulcers bilaterally. She has been tolerating the dressing changes without complication. Fortunately there does not appear to be any evidence of infection at this time. Overall I feel like she is improving in an excellent manner and I'm very pleased with the fact that everything seems to be turning towards the better for her. This has obviously been a long road. 09/27/18 on evaluation today patient actually appears to be doing very well in regard to her bilateral lateral malleolus ulcers. She has been tolerating the dressing changes without complication. Fortunately there does not appear to be any evidence of infection at this time which is also great news. No fevers, chills, nausea, or vomiting noted at this time. Overall I feel like she is doing excellent with the snap vac on the  left malleolus. She had 40 mL of fluid collection over the past week. 10/04/18 on evaluation today patient actually appears to be doing well in regard to her bilateral lateral malleolus ulcers. She continues to tolerate the dressing changes without complication. One issue that I see is the snap vac on the left lateral malleolus which appears to have sealed off some fluid underlying this area and has not really allowed it to heal to the degree that I would like to see. For that reason I did suggest at this point we may want to pack a small piece of packing strip into this region to allow it to more effectively wick out fluid. 10/11/18 in general the patient today does not feel that she has been doing very well. She's been a little bit lethargic and subsequently is having bodyaches as well according to what she  tells me today. With that being said overall she has been concerned with the fact that something may be worsening although to be honest her wounds really have not been appearing poorly. She does have a new ulcer on her left heel unfortunately. This may be pressure related. Nonetheless it seems to me to have potentially started at least as a blister I do not see any evidence of deep tissue injury. In regard to the left ankle the snap vac still seems to be causing the ceiling off of the deeper part of the wound which is in turn trapping fluid. I'm not extremely pleased with the overall appearance as far as progress from last week to this week therefore I'm gonna discontinue the snap vac at this point. 10/18/18 patient unfortunately this point has not been feeling well for the past several days. She was seen by Grayland Ormond her primary care provider who is a Librarian, academic at [facility]. Subsequently she states that she's been very weak and generally feeling malaise. No fevers, chills, nausea, or vomiting noted at this time. With that being said bloodwork was performed at the PCP office on  the 11th of this month which showed a white blood cell count of 10.7. This was repeated today and shows a white blood cell count of 12.4. This does show signs of worsening. Coupled with the fact that she is feeling worse and that her left ankle wound is not really showing signs of improvement I feel like this is an indication that the osteomyelitis is likely exacerbating not improving. Overall I think we may also want to check her C-reactive protein and sedimentation rate. Actually did call [facility] office this afternoon while the patient was in the office here with me. Subsequently based on the findings we discussed treatment possibilities and I think that it is appropriate for [place] to go ahead and initiate treatment with doxycycline which I'm going to do. Subsequently he did agree to see about adding a CRP and sedimentation rate to her orders. If that has not already been drawn to where they can run it they will contact the patient she can come back to have that check. They are in agreement with plan as far as the patient and her daughter are concerned. Nonetheless also think we need to get in touch with Dr. Henreitta Leber office to see about getting the patient scheduled with him as soon as possible. 11/08/18 on evaluation today patient presents for follow-up concerning her bilateral foot and ankle ulcers. I did do an extensive review of her chart in epic today. Subsequently she was seen by [name] he did initiate Cefepime IV antibiotic therapy. Subsequently she had some issues with her PICC line this had to be removed because it was coiled and then replaced. Fortunately that was now settled. Unfortunately she has continued have issues with her left heel as well as the issues that she is experiencing with her bilateral lateral malleolus regions. I do believe however both areas seem to be doing a little bit better on evaluation today which is good news. No fevers, chills, nausea, or vomiting noted at  this time. She actually has an angiogram schedule with Dr. dew on this coming Monday, November 11, 2018. Subsequently the patient states that she is feeling much better especially than what she was roughly 2 weeks ago. She actually had to cancel an appointment because she was feeling so poorly. No fevers, chills, nausea, or vomiting noted at this time. 11/15/18 on  evaluation today patient actually is status post having had her angiogram with Dr. dew Monday, four days ago. 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 He had to go and work on several areas of the vasculature fortunately he was able to obtain no more than a 30% residual stenosis throughout post procedure. I reviewed this note today. I think this will definitely help with healing at this time. Fortunately there does not appear to be any signs of infection and I do feel like ratio already has a better appearance to it. 11/22/18 upon evaluation today patient actually appears to be doing very well in regard to her wounds in general. The right lateral malleolus looks excellent the heel looks better in the left lateral malleolus also appears to be doing a little better. With that being said the right second toe actually appears to be open and training we been watching this is been dry and stable but now is open. 12/03/2018 Seen today for follow-up and management of multiple bilateral lower extremity wounds. New pressure injury of the great toe which is closed at this time. Wound of the right distal second toe appears larger today with deep undermining and a pocket of fluid present within the undermining region. Left and right malleolus is wounds are stable today with no signs and symptoms of infection.Denies any needs or concerns during exam today. 12/13/18 on evaluation today patient appears to be doing somewhat better in regard to her left heel ulcer. She also seems to be completely healed in regard to the right  lateral malleolus ulcer. The left malleolus ulcer is smaller what unfortunately the wounds which are new over the first and second toes of the right foot are what are most concerning at this point especially the second. Both areas did require sharp debridement today. 12/20/18 on evaluation today patient's wound actually appears to be doing better in regard to left lateral ankle and her right lateral ankle continues to remain healed. The hill ulcer on the left is improved. She does have improvement noted as well in regard to both toe ulcers. Overall I'm very pleased in this regard. No fevers, chills, nausea, or vomiting noted at this time. ALAYCIA, EARDLEY ([id number]) 12/23/18 on evaluation today patient is seen after she had her toenails trimmed at the podiatrist office due to issues with her right great toe. There was what appeared to be dark eschar on the surface of the wound which had her in the podiatrist concerned. Nonetheless as I remember that during the last office visit I had utilize silver nitrate of this area I was much less concerned about the situation. Subsequently I was able to clean off much of this tissue without any complication today. This does not appear to show any signs of infection and actually look somewhat better compared to last time post debridement. Her second toe on the right foot actually had callous over and there did appear still be some fluid underneath this that would require debridement today. 12/27/18 on evaluation today patient actually appears to be showing signs of improvement at all locations. Even the left lateral ankle although this is not quite as great as the other sites. Fortunately there does not appear to be any signs of infection at this time and both of her toes on the right foot seem to be showing signs of improvement which is good news and very pleased in this regard. 01/03/19 on evaluation today patient appears to be  doing better for the most part in  regard to her wounds in particular. There does not appear to be any evidence of infection at this time which is good news. Fortunately there is no sign of really worsening anywhere except for the right great toe which she does have what appears to be a bruise/deep tissue injury which is very superficial and already resolving. I'm not sure where this came from I questioned her extensively and she does not recall what may have happened with this. Other than that the patient seems to be doing well even the left lateral ankle ulcer looks good and is getting smaller. 01/10/19 on evaluation today patient appears to be doing well in regard to her left heel wound and both of her toe wounds. Overall I feel like there is definitely improvement here and I'm happy in that regard. With that being said unfortunately she is having issues with the left lateral malleolus ulcer which unfortunately still has a lot of depth to it. This is gonna be a very difficult wound for [place] to be able to truly get to heal. I may want to consider some type of skin substitute to see if this would be of benefit for her. I'll discuss this with her more the next visit most likely. This was something I thought about more at the end of the visit when I was Artie out of the room and the patient had been discharged. 01/17/19 on evaluation today patient appears to be doing very well in regard to her wounds in general. She's been making excellent progress at this time. Fortunately there's no sign of infection at this time either. No fevers, chills, nausea, or vomiting noted at this time. The biggest issue is still her left lateral malleolus where it appears to be doing well and is getting smaller but still shows a small corner where this is deeper and goes down into what appears to be the joint space. Nonetheless this is taking much longer to heal although it still looks better in smaller than previous evaluations. 01/24/19 on evaluation today  patient's wounds actually appear to be doing rather well in general overall. She did require some sharp debridement in regard to the right great toe but everything else appears to be doing excellent no debridement was even necessary. No fevers, chills, nausea, or vomiting noted at this time. 01/31/19 on evaluation today patient actually appears to be doing much better in regard to her left foot wound on the heel as well as the ankle. The right great toe appears to be a little bit worse today this had callous over and trapped a lot of fluid underneath. Fortunately there's no signs of infection at any site which is great news. 02/07/19 on evaluation today patient actually appears to be doing decently well in regard to all of her ulcers at this point. No sharp debridement was required she is a little bit of hyper granulation in regard to the left lateral ankle as well as the left heel but the hill itself is almost completely healed which is excellent news. Overall been very pleased in this regard. 02/14/19 on evaluation today patient actually appears to be doing very well in regard to her ulcers on the right first toe, left lateral malleolus, and left heel. In fact the heel is almost completely healed at this point. The patient does not show any signs of infection which is good news. Overall very pleased with how things have progressed. 04/18/19 Telehealth Evaluation During  the COVID-19 National Emergency: Verbal Consent: Obtained from patient Allergies: reviewed and the active list is current. Medication changes: patient has no current medication changes. COVID-19 Screening: 1. Have you traveled internationally or on a cruise ship in the last 14 dayso No 2. Have you had contact with someone with or under investigation for COVID-19o No 3. Have you had a fever, cough, sore throat, or experiencing shortness of breatho No on evaluation today actually did have a visit with this patient through a telehealth  encounter with her home health nurse. Subsequently it was noted that the patient actually appears to be doing okay in regard to her wounds both the right great toe as well as the left lateral malleolus have shown signs of improvement although this in your theme around the left lateral malleolus there eschar coverings for both locations. The question is whether or not they are actually close and whether or not home health needs to discharge the patient or not. Nonetheless my concern is this point obviously is that without actually seeing her and being able to evaluate this directly I cannot ensure that she is completely healed which is the question that I'm being asked. 04/22/19 on evaluation today patient presents for her first evaluation since last time I saw her which was actually February 14, 2019. I did do a telehealth visit last week in which point it was questionable whether or not she may be healed and had to bring her in today for confirmation. With that being said she does seem to be doing quite well at this point which is good news. There does not appear to be any drainage in the deed I believe her wounds may be healed. Readmission: 09/04/2019 on evaluation today patient appears to be doing unfortunately somewhat more poorly in regard to her left foot ulcer secondary to a wound that began on 08/21/2019 at least when she first noticed this. Fortunately she has not had any evidence of active infection at this time. Systemically. I also do not necessarily see any evidence of infection at the blister/wound site on the first metatarsal head plantar aspect. This almost appears to be something that may have just rubbed inappropriately causing this to breakdown. They did not want a wait too long to come in to be seen as again she had significant issues in the past with wounds that took quite a while to heal in fact it was close to 2 years. Nonetheless this does not appear to be quite that bad but again  we do need to remove some of the necrotic tissue from the surface of the wound to tell exactly the extent. She does not appear to have any significant arterial disease at this point and again her last ABIs and TBI's are recorded above in the alert section her left ABI was 1.27 with a TBI of 0.72 to the right ABI 1.08 with a TBI of 0.39. Other than this the patient has been doing quite DELLIA, DONNELLY ([id number]) well since I last saw her and that was in May 2020. 09/11/2019 on evaluation today patient appeared to be doing very well with regard to her plantar foot ulcer on the left. In fact this appears to be almost completely healed which is awesome. That is after just 1 week of intervention. With that being said there is no signs of active infection at this time. 09/18/2019 on evaluation today patient actually appears to be doing excellent in fact she is completely healed based on what  I am seeing at this point. Fortunately there is no signs of active infection at this time and overall patient is very pleased to hear that this area has healed so quickly. Readmission: 05/13/2021 upon evaluation today this patient presents for reevaluation here in the clinic. This is a wound that actually we previously took care of. She had 1 on the right ankle and the left the left turned out to be be harder due to to heal but nonetheless is doing great at this point as the right that has reopened and it was noted first just several weeks ago with a scab over it and came off in just the past few days. Fortunately there does not appear to be any obvious evidence of significant active infection at this time which is great news. No fevers, chills, nausea, vomiting, or diarrhea. 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 There does not appear to be any signs of this interfering in any way with her wound. She does have swelling we previously had compression socks for her  ordered but again it does not look like she wears these on a regular basis by any means. 05/26/2021 upon evaluation today patient appears to be doing well with regard to her wound which is actually showing signs of excellent improvement. There does not appear to be any signs of active infection which is great news and overall very pleased with where things stand today. No fevers, chills, nausea, vomiting, or diarrhea. 06/02/2021 upon evaluation today patient's wound actually showing signs of excellent improvement. Fortunately there does not appear to be any signs of active infection which is great news. I think the patient is making good progress with regard to her wounds in general. 06/09/2021 upon evaluation today patient appears to be doing excellent in regard to her wounds currently. Fortunately there does not appear to be any signs of active infection which is great news. No fevers, chills, nausea, vomiting, or diarrhea. Overall extremely pleased with where things stand today. I think the patient is making excellent progress. 06/16/2021 upon evaluation today patient appears to be doing well in regard to her wound. This is going require little bit of debridement today and that was discussed with the patient. Otherwise she seems to be doing quite well and I am actually very pleased with where things stand at this point. No fevers, chills, nausea, vomiting, or diarrhea. 06/23/2021 upon evaluation today patient appears to be doing well with regard to her wounds. She has been tolerating the dressing changes without complication. Fortunately there does not appear to be any evidence of infection and she has not had air in her home which she actually lives at an assisted living that got fixed this morning. With that being said because of that her wrap has been extremely hot and bothersome for her over the past week. 06/30/2021 upon evaluation today patient is actually making excellent progress in regard to  her ankle ulcer. She has been tolerating the dressing changes without complication and overall extremely pleased with where things stand there does not appear to be any evidence of active infection which is great news. No fevers, chills, nausea, vomiting, or diarrhea. 07/07/21 upon evaluation today patients and culture on the right actually appears to be doing quite well. There does not appear to be any signs of infection and overall very pleased with where things stand today. No fevers, chills, nausea, or vomiting noted at this time. 07/14/2021 unfortunately  the patient today has some evidence of deep tissue injury and pressure getting to the ankle region. Again I am not exactly sure what is going on here but this is very similar to issues that we have had in the past. I explained to the patient that she needs to be very mindful of exactly what is happening I think sleeping in bed is probably the main issue here although there could be other culprits I am not sure what else would potentially lead to this kind of a problem for her. 07/21/2021 upon evaluation today patient's wound actually showing signs of improvement compared to last week. Fortunately there does not appear to be any signs of active infection which is great news and overall very pleased with where things stand in that regard. With that being said I do believe that she is continuing to show signs of overall of getting better although I think this is still basically about what we were 2 weeks ago due to the worsening and now improvement. 07/28/2021 upon evaluation today patient appears to be doing well with regard to her wound. She does have some slough buildup on the surface of the wound which I would have to manage today. Fortunately there is no sign of active infection at this time. No fevers, chills, nausea, vomiting, or diarrhea. 08/04/2021 upon evaluation today patient appears to be doing about the same in regard to her wound. To be  perfectly honest I am beginning to be a little bit concerned about the overall appearance of the wound bed. I do think possibly taking a sample right around the margin of the wound could be beneficial for her as far as identifying anything such as an inflammatory process or to be honest even a skin tag cancer type process that may be of concern here. Fortunately there does not appear to be any evidence of active infection at this time which is great news she is not having any pain also great news. 08/11/2021 upon evaluation today patient appears to be doing well with regard to her wound. The good news is I did review her biopsy results and it showed some inflammatory mixed findings but nothing that appeared to be malignant which is great news. Overall this is more of a chronic venous stasis type issue which again is more what we have been treating. Nonetheless I just wanted to make sure before going forward that there was not anything more untoward going on at this point. 08/18/2021 upon evaluation today patient appears to be doing well with regard to her ankle ulcer. Fortunately there does not appear to be any signs of active infection at this time which is great overall wound is dramatically improved compared to last week. Since last week I have actually placed her on doxycycline and subsequently this is a good option as far as the findings are concerned at this point. 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 the doxycycline is doing that. There does not appear to be any evidence of active infection systemically which is great news. 08/25/2021 upon evaluation today patient appears to be doing well with regard to her wound. I feel like we are finally get back on track as far as healing is concerned I am much happier with the overall appearance today. I do think that she is  tolerating the dressing changes without  complication which is great news. We have been using Hydrofera Blue which I think is a good option. The good news is she is also doing great in [name], MALESHA SULIMAN. ([id number]) regard to her compression sock on the left which is a zipper compression that seems to be doing a great job keeping her edema under good control. 09/01/2021 upon evaluation today patient appears to be doing well with regard to her wound. Infection seems to be under much better control which is great news and very pleased in that regard. Fortunately there does not appear to be any signs of infection currently. 09/08/2021 upon evaluation today patient actually appears to be making good progress in regard to her wound. She has been tolerating the dressing changes without complication. Fortunately there does not appear to be any evidence of active infection at this time which is great news as well. No fevers, chills, nausea, vomiting, or diarrhea. 09/22/2021 upon evaluation today patient appears to be doing well with regard to her wound although is very slow to heal. We have not looked into Apligraf yet I think that is something that we should see about doing. 09/29/2021 upon evaluation today patient's wound is actually showing signs of doing about the same. I am not seeing any evidence of worsening but also no significant evidence of improvement. We did gain approval for the organogenesis products all except for Apligraf as covered by her insurance. With that being said I do think that we can go ahead and proceed with the NuShield if the patient and her family in agreement of the plan I discussed that with him today she does have a 20% coinsurance which we also discussed. 10/06/2021 upon evaluation today patient appears to unfortunately be doing a little bit worse she appears to be infected based on what I am seeing. Fortunately there does not appear to be any signs of active infection at this  time which is great news. Unfortunately it does appear to be some evidence of around the wound edge indicated by way of erythema and warmth as well as redness 10/13/2021 upon evaluation today patient actually appears to be doing excellent in regard to her ankle ulcer compared to what it was. 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 I think were fine to go ahead and apply the NuShield today. 10/20/2021 upon evaluation today patient appears to be doing excellent in fact the NuShield seems to have done all some for her thus far. I am actually very pleased with where we stand and overall I think that she is making great progress. There is no evidence of active infection at this time. 11/23; patient presents for follow-up. She has no issues or complaints today. She denies signs of infection. She reports stability in her wound healing. 11/03/2021 upon evaluation today patient appears to be doing well with regard to her wounds. She has been tolerating the dressing changes without complication. Fortunately there is no signs of active infection at this time. 11/10/2021 upon evaluation today patient appears to be doing well with regard to her right ankle which is actually showing signs of improvement with the NuShield I am very pleased. Subsequently in regards to the left ankle this appears to be doing okay with no evidence of issue here either. With that being said she  has a significant contusion on the left leg from having fallen 2 days ago. She tells me that she was using her walker going to the closet and then when she got to the closet turned around to get something out at which point she fell according to the story. Nonetheless she does have a hematoma just below her knee on the anterior portion of her shin. My  hope is that this will not open until wound although we do need I think Some compression over it also think that we need to have her use an ice as well to help with the swelling and prevent this from getting worse. The last thing she needs is a wound opened up here. 11/17/2021 upon evaluation patient's right ankle actually showing signs of improvement based on what I see currently there is a lot of new skin growth coming in which is great news. Nonetheless I do feel like that the patient is showing improvement as well in regard to her left leg with the use of the Tubigrip it swollen but not as bruised as what I would have expected after what I was seeing last week. Nonetheless I do think doubling up on the Tubigrip would probably be beneficial to try to keep some of the edema under better control here. 11/24/2021 upon evaluation today patient appears to be doing excellent in regard to her wound. This is actually measuring significantly smaller which is great news. Fortunately I do not see any signs of active infection locally nor systemically at this point. Overall I am very pleased with how the NuShield is doing. 12/30; wound bed looks healthy however not much change in overall wound volume. We applied Nushield again today in the standard fashion 12/08/2021 upon evaluation today patient's wound actually showing signs of good improvement and I am actually very pleased with where we stand today as well. I do not see any evidence of active infection locally nor systemically which is great news. Unfortunately she has been having some issues with stomach upset and diarrhea today. 12/15/2021 upon evaluation today patient appears to be doing excellent in regard to her wound. There is a little bit of dry skin raised up around the edges of the wound but fortunately nothing too significant at this point. Fortunately I do not see any evidence of active infection either which is great news. No fevers, chills,  nausea, vomiting, or diarrhea. 12/29/2021 upon evaluation today patient appears to be doing well with regard to her wound. In general I feel like she is getting very close to complete resolution. I think that she is making good progress here as well. Overall I do not see any signs of infection and very little of this appears to actually be open. 01/12/2022 upon evaluation today patient appears to be doing a little bit worse in regard to her wound. It appears that the collagen actually trapped fluid underneath the collagen which got hard and subsequently caused the fluid to back up. This patient has made the wound appear to be larger than what it was previous. With that being said I do not see any signs of obvious infection is not warm to touch and not significantly erythematous either which is good news. Nonetheless I do think that we will need to continue to keep an eye on this. Probably not go put on antibiotic this week but I will be too far from doing so she is actually on a Z-Pak right now I do not want to really  double up on antibiotics. 01/26/2022 upon evaluation today patient's wound is showing signs of improvement although this is slow to turn back around. Fortunately there does not appear to be any evidence of active infection locally or systemically which is great news and overall I am extremely pleased with where we stand today. No fevers, chills, nausea, vomiting, or diarrhea. 02/02/2022 upon evaluation today patient appears to be doing well with regard to her wound. She has been tolerating the dressing changes without complication. Fortunately there does not appear to be any signs of active infection locally nor systemically at this time which is great news. No fevers, chills, nausea, vomiting, or diarrhea. DELORSE, SHANE ([id number]) 3/9; patient presents for follow-up. She has no issues or complaints today. She denies signs of infection. 02/16/2022 upon evaluation today patient appears  to be doing well with regard to her wound. She has been tolerating the dressing changes without complication. Fortunately I do not see any evidence of active infection locally nor systemically at this point which is great news. Overall I think that the patient is making excellent progress in general. The wound is measuring smaller and though there does appear to be some need for sharp debridement today this is minimal compared to what we have noted previous. 02/23/2022 upon evaluation today patient appears to be doing well with regard to her wound she is definitely showing signs of improvement which is great news. 03/02/2022 upon evaluation today patient appears to be doing about the same in regard to her wound. Unfortunately were not seeing significant improvement. With that being said is also not significantly worse which is great news. No fevers, chills, nausea, vomiting, or diarrhea. 03-09-2022 upon evaluation today patient appears to be doing well with regard to her wound. She has been tolerating the dressing changes without complication. Fortunately there does not appear to be any signs of active infection locally or systemically which is great news. 03-16-2022 upon evaluation today patient appears to be doing well with regard to her wound this is measuring smaller and looking much better. I am actually very pleased with where we stand and I think that the patient is making great progress. I do not see any evidence of active infection locally or systemically which is great news. No fevers, chills, nausea, vomiting, or diarrhea. 03-23-2022 upon evaluation today patient appears to be doing better in regard to her wounds. In fact the wound area actually is showing signs of excellent improvement and actually very pleased with where we stand today. I do not see any evidence of active infection locally nor systemically which is great news. 4/27; comes in today with again thick callus around the wound  circumference which I removed with a curette. Some debris on the surface. Overall I do not think quite as good as it was last week by description. Using endoform 04-06-2022 upon evaluation today patient appears to be doing pretty well in regard to her wound. Fortunately I do not see any evidence of active infection locally or systemically which is great news and overall I am pleased in that regard. I do feel like this is measuring smaller postdebridement compared to where we were previous. Overall she is headed in the right direction. She does have an area that is threatening to open and looks a little irritated on the left ankle we did measure this just for the discolored area for now its not draining too much but we want to monitor and make sure nothing worsens here. 04-13-2022  upon evaluation today patient appears to be doing better in regard to her wound although this is still very slow to heal. We have reapplied for a skin substitute but have not heard anything back as of yet. Fortunately I do not see any evidence of active infection locally or systemically at this time which is great news. 04-20-2022 upon evaluation today patient's wound is actually showing signs of significant improvement which is great news. We did get approval for skin substitutes which I think is an option here for [place] but at the same time I am not even certain that this is going to be necessary especially considering how well things seem to be doing at this point. If she continues healing as we see her right now but I think were probably going to be able to avoid any need for additional skin substitute. Patient and her daughter are both in agreement with that plan. With that being said she did have a fall she has several areas of contusion fortunately nothing that is can require intervention at this point but nonetheless she did also fracture her rib which is extremely uncomfortable obviously. 04-27-2022 upon evaluation today  patient appears to be doing well currently in regard to her wound on the right lateral ankle. Overall I feel like she is actually doing significantly better even compared to last week and very pleased in this regard. I think we are on the right track here. 05-04-2022 upon evaluation today patient appears to be doing well with regard to her wound. She is going require some sharp debridement today to clear away some of the necrotic debris. Fortunately I was able to do this quite easily without significant issue or breakdown here. There is a very small area of opening centrally but really this is pretty close to getting close. 05-11-2022 upon evaluation today patient's wound is actually showing signs of excellent improvement. I do not see any evidence of infection locally or systemically at this time. Overall I think she is very close to complete resolution with just a little bit of debridement to clear away some of the dry crusty area around the edges of the wound currently. Objective Constitutional Well-nourished and well-hydrated in no acute distress. Vitals Time Taken: 10:23 AM, Height: 62 in, Weight: 150 lbs, BMI: 27.4, Temperature: 98.2 F, Pulse: 69 bpm, Respiratory Rate: 18 breaths/min, Blood Pressure: 119/68 mmHg. Respiratory normal breathing without difficulty. Psychiatric this patient is able to make decisions and demonstrates good insight into disease process. Alert and Oriented x 3. pleasant and cooperative. BARBARA, AHART ([id number]) General Notes: Patient's wound bed showed signs of good granulation and epithelization at this point. In fact there is just a very tiny opening centrally I think were very close to complete resolution. Integumentary (Hair, Skin) Wound #7 status is Open. Original cause of wound was Gradually Appeared. The date acquired was: 05/12/2021. The wound has been in treatment 51 weeks. The wound is located on the Right,Lateral Malleolus. The wound measures 0.5cm  length x 0.5cm width x 0.2cm depth; 0.196cm^2 area and 0.039cm^3 volume. There is Fat Layer (Subcutaneous Tissue) exposed. There is no tunneling or undermining noted. There is a medium amount of serosanguineous drainage noted. There is medium (34-66%) red granulation within the wound bed. There is a medium (34-66%) amount of necrotic tissue within the wound bed. Assessment Active Problems ICD-10 Pressure ulcer of right ankle, stage 3 Lymphedema, not elsewhere classified Venous insufficiency (chronic) (peripheral) Presence of cardiac pacemaker Long term (current) use of  anticoagulants Procedures Wound #7 Pre-procedure diagnosis of Wound #7 is a Pressure Ulcer located on the Right,Lateral Malleolus . There was a Excisional Skin/Subcutaneous Tissue Debridement with a total area of 0.25 sq cm performed by Tommie Sams., PA-C. With the following instrument(s): Curette to remove Viable and Non-Viable tissue/material. Material removed includes Subcutaneous Tissue, Skin: Dermis, Skin: Epidermis, and Biofilm. No specimens were taken. A time out was conducted at 10:40, prior to the start of the procedure. A Minimum amount of bleeding was controlled with Pressure. The procedure was tolerated well with a pain level of 0 throughout and a pain level of 0 following the procedure. Post Debridement Measurements: 0.5cm length x 0.5cm width x 0.2cm depth; 0.039cm^3 volume. Post debridement Stage noted as Category/Stage III. Character of Wound/Ulcer Post Debridement is improved. Post procedure Diagnosis Wound #7: Same as Pre-Procedure Plan Follow-up Appointments: Return Appointment in 1 week. Bathing/ Shower/ Hygiene: May shower with wound dressing protected with water repellent cover or cast protector. Edema Control - Lymphedema / Segmental Compressive Device / Other: Optional: One layer of unna paste to top of compression wrap (to act as an anchor). - and at toes Patient to wear own compression  stockings. Remove compression stockings every night before going to bed and put on every morning when getting up. - left leg Elevate, Exercise Daily and Avoid Standing for Long Periods of Time. Elevate legs to the level of the heart and pump ankles as often as possible Elevate leg(s) parallel to the floor when sitting. WOUND #7: - Malleolus Wound Laterality: Right, Lateral Cleanser: Soap and Water 1 x Per Week/30 Days Discharge Instructions: Gently cleanse wound with antibacterial soap, rinse and pat dry prior to dressing wounds Primary Dressing: Endoform 2x2 (in/in) 1 x Per Week/30 Days Discharge Instructions: Apply Endoform as directed Secondary Dressing: Gauze 1 x Per Week/30 Days Discharge Instructions: As directed: dry, moistened with saline or moistened with Dakins Solution Secondary Dressing: mepitil one 1 x Per Week/30 Days Compression Wrap: 3-LAYER WRAP - Profore Lite LF 3 Multilayer Compression Bandaging System 1 x Per Week/30 Days Discharge Instructions: Apply 3 multi-layer wrap as prescribed. EREN, PUEBLA ([id number]) 1. I would recommend that we go ahead and continue with the wound care measures as before this includes the use of the endoform hopefully by next week 2 at the most will be completely closed. 2. Most I can recommend that we continue with the compression wrap. Again we have been using a 3 layer compression wrap which I do believe is doing quite well. We will see patient back for reevaluation in 1 week here in the clinic. If anything worsens or changes patient will contact our office for additional recommendations. Electronic Signature(s) Signed: 05/11/2022 2:50:57 PM By: Worthy Keeler PA-C Entered By: Worthy Keeler on 05/11/2022 14:50:57 Grzywacz, [name] ([id number]) -------------------------------------------------------------------------------- SuperBill Details Patient Name: [name] Date of Service: 05/11/2022 Medical Record Number:  [id number] Patient Account Number: [id number] Date of Birth/Sex: [birth year] (88 y.o. F) Treating RN: Carlene Coria Primary Care Provider: Adrian Prows Other Clinician: Referring Provider: Adrian Prows Treating Provider/Extender: Skipper Cliche in Treatment: 51 Diagnosis Coding ICD-10 Codes Code Description [phone] Pressure ulcer of right ankle, stage 3 I89.0 Lymphedema, not elsewhere classified I87.2 Venous insufficiency (chronic) (peripheral) Z95.0 Presence of cardiac pacemaker Z79.01 Long term (current) use of anticoagulants Facility Procedures CPT4 Code: [id number] Description: 94076 - DEB SUBQ TISSUE 20 SQ CM/< Modifier: Quantity: 1 CPT4 Code: Description: ICD-10 Diagnosis Description L89.513 Pressure ulcer of right  ankle, stage 3 Modifier: Quantity: Physician Procedures CPT4 Code: [id number] Description: 90931 - WC PHYS SUBQ TISS 20 SQ CM Modifier: Quantity: 1 CPT4 Code: Description: ICD-10 Diagnosis Description 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, stage 3 Modifier: Quantity: Electronic Signature(s) Signed: 05/11/2022 2:51:28 PM By: Worthy Keeler PA-C Entered By: Worthy Keeler on 05/11/2022 14:51:28

## 2022-05-12 NOTE — Progress Notes (Signed)
[name], [name]. ([id number]) Visit Report for 05/11/2022 Arrival Information Details Patient Name: [name], [name]. Date of Service: 05/11/2022 10:15 AM Medical Record Number: [id number] Patient Account Number: [id number] Date of Birth/Sex: 09/25/1923 (88 y.o. F) Treating RN: Epps, Carrie Primary Care Provider: FItzgerald, David Other Clinician: Referring Provider: FItzgerald, David Treating Provider/Extender: Stone, Hoyt Weeks in Treatment: 51 Visit Information History Since Last Visit All ordered tests and consults were completed: No Patient Arrived: Walker Added or deleted any medications: No Arrival Time: 10:19 Any new allergies or adverse reactions: No Accompanied By: daughter Had a fall or experienced change in No Transfer Assistance: None activities of daily living that may affect Patient Identification Verified: Yes risk of falls: Secondary Verification Process Completed: Yes Signs or symptoms of abuse/neglect since last visito No Patient Requires Transmission-Based No Hospitalized since last visit: No Precautions: Implantable device outside of the clinic excluding No Patient Has Alerts: Yes cellular tissue based products placed in the center Patient Alerts: Patient on Blood since last visit: Thinner Has Dressing in Place as Prescribed: Yes NOT DIABETIC Pain Present Now: No ***ELIQUIS*** Electronic Signature(s) Signed: 05/12/2022 9:45:44 AM By: Epps, Carrie RN Entered By: Epps, Carrie on 05/11/2022 10:23:24 Pinnock, Sindy [name]. ([id number]) -------------------------------------------------------------------------------- Clinic Level of Care Assessment Details Patient Name: [name]. Date of Service: 05/11/2022 10:15 AM Medical Record Number: [id number] Patient Account Number: [id number] Date of Birth/Sex: 06/30/1923 (88 y.o. F) Treating RN: Epps, Carrie Primary Care Provider: FItzgerald, David Other Clinician: Referring Provider: FItzgerald,  David Treating Provider/Extender: Stone, Hoyt Weeks in Treatment: 51 Clinic Level of Care Assessment Items TOOL 1 Quantity Score [] - Use when EandM and Procedure is performed on INITIAL visit 0 ASSESSMENTS - Nursing Assessment / Reassessment [] - General Physical Exam (combine w/ comprehensive assessment (listed just below) when performed on new 0 pt. evals) [] - 0 Comprehensive Assessment (HX, ROS, Risk Assessments, Wounds Hx, etc.) ASSESSMENTS - Wound and Skin Assessment / Reassessment [] - Dermatologic / Skin Assessment (not related to wound area) 0 ASSESSMENTS - Ostomy and/or Continence Assessment and Care [] - Incontinence Assessment and Management 0 [] - 0 Ostomy Care Assessment and Management (repouching, etc.) PROCESS - Coordination of Care [] - Simple Patient / Family Education for ongoing care 0 [] - 0 Complex (extensive) Patient / Family Education for ongoing care [] - 0 Staff obtains Consents, Records, Test Results / Process Orders [] - 0 Staff telephones HHA, Nursing Homes / Clarify orders / etc [] - 0 Routine Transfer to another Facility (non-emergent condition) [] - 0 Routine Hospital Admission (non-emergent condition) [] - 0 New Admissions / Insurance Authorizations / Ordering NPWT, Apligraf, etc. [] - 0 Emergency Hospital Admission (emergent condition) PROCESS - Special Needs [] - Pediatric / Minor Patient Management 0 [] - 0 Isolation Patient Management [] - 0 Hearing / Language / Visual special needs [] - 0 Assessment of Community assistance (transportation, D/C planning, etc.) [] - 0 Additional assistance / Altered mentation [] - 0 Support Surface(s) Assessment (bed, cushion, seat, etc.) INTERVENTIONS - Miscellaneous [] - External ear exam 0 [] - 0 Patient Transfer (multiple staff / Hoyer Lift / Similar devices) [] - 0 Simple Staple / Suture removal (25 or less) [] - 0 Complex Staple / Suture removal (26 or more) [] - 0 Hypo/Hyperglycemic  Management (do not check if billed separately) [] - 0 Ankle / Brachial Index (ABI) - do not check if billed separately Has the patient been seen at the hospital within   the last three years: Yes Total Score: 0 Level Of Care: ____ [name] ([id number]) Electronic Signature(s) Signed: 05/12/2022 9:45:44 AM By: Carlene Coria RN Entered By: Carlene Coria on 05/11/2022 10:44:41 Levee, [name] ([id number]) -------------------------------------------------------------------------------- Encounter Discharge Information Details Patient Name: [name] Date of Service: 05/11/2022 10:15 AM Medical Record Number: [id number] Patient Account Number: [id number] Date of Birth/Sex: [birth year] (88 y.o. F) Treating RN: Carlene Coria Primary Care Orie Cuttino: Adrian Prows Other Clinician: Referring Kalei Meda: Adrian Prows Treating Jamoni Hewes/Extender: Skipper Cliche in Treatment: 24 Encounter Discharge Information Items Post Procedure Vitals Discharge Condition: Stable Temperature (F): 98.2 Ambulatory Status: Ambulatory Pulse (bpm): 69 Discharge Destination: Home Respiratory Rate (breaths/min): 18 Transportation: Private Auto Blood Pressure (mmHg): 119/68 Accompanied By: daughter Schedule Follow-up Appointment: Yes Clinical Summary of Care: Electronic Signature(s) Signed: 05/12/2022 9:45:44 AM By: Carlene Coria RN Entered By: Carlene Coria on 05/11/2022 10:45:47 Dempster, [name] ([id number]) -------------------------------------------------------------------------------- Lower Extremity Assessment Details Patient Name: [name] Date of Service: 05/11/2022 10:15 AM Medical Record Number: [id number] Patient Account Number: [id number] Date of Birth/Sex: 1923/04/07 (88 y.o. F) Treating RN: Carlene Coria Primary Care Amaani Guilbault: Adrian Prows Other Clinician: Referring Pharrell Ledford: Adrian Prows Treating Caytlin Better/Extender: Skipper Cliche in Treatment: 51 Edema  Assessment Assessed: [Left: No] [Right: No] [Left: Edema] [Right: :] Calf Left: Right: Point of Measurement: 33 cm From Medial Instep 28 cm Ankle Left: Right: Point of Measurement: 10 cm From Medial Instep 17 cm Vascular Assessment Pulses: Dorsalis Pedis Palpable: [Right:Yes] Electronic Signature(s) Signed: 05/12/2022 9:45:44 AM By: Carlene Coria RN Entered By: Carlene Coria on 05/11/2022 10:36:06 Argote, [name] ([id number]) -------------------------------------------------------------------------------- Multi Wound Chart Details Patient Name: [name] Date of Service: 05/11/2022 10:15 AM Medical Record Number: [id number] Patient Account Number: [id number] Date of Birth/Sex: 27-May-1923 (88 y.o. F) Treating RN: Carlene Coria Primary Care Deiondra Denley: Adrian Prows Other Clinician: Referring Savien Mamula: Adrian Prows Treating Asher Babilonia/Extender: Skipper Cliche in Treatment: 71 Vital Signs Height(in): 62 Pulse(bpm): 16 Weight(lbs): 150 Blood Pressure(mmHg): 119/68 Body Mass Index(BMI): 27.4 Temperature(F): 98.2 Respiratory Rate(breaths/min): 18 Photos: [N/A:N/A] Wound Location: Right, Lateral Malleolus N/A N/A Wounding Event: Gradually Appeared N/A N/A Primary Etiology: Pressure Ulcer N/A N/A Comorbid History: Cataracts, Congestive Heart Failure, N/A N/A Hypertension, Peripheral Arterial Disease, Osteoarthritis, Neuropathy Date Acquired: 05/12/2021 N/A N/A Weeks of Treatment: 51 N/A N/A Wound Status: Open N/A N/A Wound Recurrence: No N/A N/A Measurements L x W x D (cm) 0.5x0.5x0.2 N/A N/A Area (cm) : 0.196 N/A N/A Volume (cm) : 0.039 N/A N/A % Reduction in Area: 82.50% N/A N/A % Reduction in Volume: 82.70% N/A N/A Classification: Category/Stage III N/A N/A Exudate Amount: Medium N/A N/A Exudate Type: Serosanguineous N/A N/A Exudate Color: red, brown N/A N/A Granulation Amount: Medium (34-66%) N/A N/A Granulation Quality: Red N/A N/A Necrotic Amount:  Medium (34-66%) N/A N/A Exposed Structures: Fat Layer (Subcutaneous Tissue): N/A N/A Yes Fascia: No Tendon: No Muscle: No Joint: No Bone: No Epithelialization: None N/A N/A Treatment Notes Electronic Signature(s) Signed: 05/12/2022 9:45:44 AM By: Carlene Coria RN Entered By: Carlene Coria on 05/11/2022 10:43:17 Draheim, [name] ([id number]) -------------------------------------------------------------------------------- Multi-Disciplinary Care Plan Details Patient Name: [name] Date of Service: 05/11/2022 10:15 AM Medical Record Number: [id number] Patient Account Number: [id number] Date of Birth/Sex: 08-25-1923 (88 y.o. F) Treating RN: Carlene Coria Primary Care Elison Worrel: Adrian Prows Other Clinician: Referring Jad Johansson: Adrian Prows Treating Shatika Grinnell/Extender: Skipper Cliche in Treatment: 75 Active Inactive Pressure Nursing Diagnoses: Knowledge deficit related to causes and risk factors for pressure ulcer development Knowledge deficit  related to management of pressures ulcers Potential for impaired tissue integrity related to pressure, friction, moisture, and shear Goals: Patient will remain free from development of additional pressure ulcers Date Initiated: 04/06/2022 Date Inactivated: 04/13/2022 Target Resolution Date: 04/06/2022 Goal Status: Met Patient/caregiver will verbalize risk factors for pressure ulcer development Date Initiated: 04/06/2022 Target Resolution Date: 04/06/2022 Goal Status: Active Patient/caregiver will verbalize understanding of pressure ulcer management Date Initiated: 04/06/2022 Target Resolution Date: 04/06/2022 Goal Status: Active Interventions: Assess: immobility, friction, shearing, incontinence upon admission and as needed Assess offloading mechanisms upon admission and as needed Assess potential for pressure ulcer upon admission and as needed Notes: Electronic Signature(s) Signed: 05/12/2022 9:45:44 AM By: Epps, Carrie  RN Entered By: Epps, Carrie on 05/11/2022 10:43:09 Rexroad, Henna [name]. ([id number]) -------------------------------------------------------------------------------- Pain Assessment Details Patient Name: Early, Eva [name]. Date of Service: 05/11/2022 10:15 AM Medical Record Number: [id number] Patient Account Number: [id number] Date of Birth/Sex: 09/14/1923 (88 y.o. F) Treating RN: Epps, Carrie Primary Care Provider: FItzgerald, David Other Clinician: Referring Provider: FItzgerald, David Treating Provider/Extender: Stone, Hoyt Weeks in Treatment: 51 Active Problems Location of Pain Severity and Description of Pain Patient Has Paino No Site Locations Pain Management and Medication Current Pain Management: Electronic Signature(s) Signed: 05/12/2022 9:45:44 AM By: Epps, Carrie RN Entered By: Epps, Carrie on 05/11/2022 10:24:13 Strubel, Demitria [name]. ([id number]) -------------------------------------------------------------------------------- Patient/Caregiver Education Details Patient Name: Trammel, Flora [name]. Date of Service: 05/11/2022 10:15 AM Medical Record Number: [id number] Patient Account Number: [id number] Date of Birth/Gender: 12/05/1922 (88 y.o. F) Treating RN: Epps, Carrie Primary Care Physician: FItzgerald, David Other Clinician: Referring Physician: FItzgerald, David Treating Physician/Extender: Stone, Hoyt Weeks in Treatment: 51 Education Assessment Education Provided To: Patient Education Topics Provided Wound/Skin Impairment: Methods: Explain/Verbal Responses: State content correctly Electronic Signature(s) Signed: 05/12/2022 9:45:44 AM By: Epps, Carrie RN Entered By: Epps, Carrie on 05/11/2022 10:44:57 Volner, Mailyn [name]. ([id number]) -------------------------------------------------------------------------------- Wound Assessment Details Patient Name: Privott, Sophina [name]. Date of Service: 05/11/2022 10:15 AM Medical Record Number: [id number] Patient Account Number:  [id number] Date of Birth/Sex: 11/16/1923 (88 y.o. F) Treating RN: Epps, Carrie Primary Care Provider: FItzgerald, David Other Clinician: Referring Provider: FItzgerald, David Treating Provider/Extender: Stone, Hoyt Weeks in Treatment: 51 Wound Status Wound Number: 7 Primary Pressure Ulcer Etiology: Wound Location: Right, Lateral Malleolus Wound Open Wounding Event: Gradually Appeared Status: Date Acquired: 05/12/2021 Comorbid Cataracts, Congestive Heart Failure, Hypertension, Weeks Of Treatment: 51 History: Peripheral Arterial Disease, Osteoarthritis, Neuropathy Clustered Wound: No Photos Wound Measurements Length: (cm) 0.5 Width: (cm) 0.5 Depth: (cm) 0.2 Area: (cm) 0.196 Volume: (cm) 0.039 % Reduction in Area: 82.5% % Reduction in Volume: 82.7% Epithelialization: None Tunneling: No Undermining: No Wound Description Classification: Category/Stage III Exudate Amount: Medium Exudate Type: Serosanguineous Exudate Color: red, brown Foul Odor After Cleansing: No Slough/Fibrino Yes Wound Bed Granulation Amount: Medium (34-66%) Exposed Structure Granulation Quality: Red Fascia Exposed: No Necrotic Amount: Medium (34-66%) Fat Layer (Subcutaneous Tissue) Exposed: Yes Tendon Exposed: No Muscle Exposed: No Joint Exposed: No Bone Exposed: No Treatment Notes Wound #7 (Malleolus) Wound Laterality: Right, Lateral Cleanser Soap and Water Discharge Instruction: Gently cleanse wound with antibacterial soap, rinse and pat dry prior to dressing wounds Peri-Wound Care Porta, Malijah [name]. ([id number]) Topical Primary Dressing Endoform 2x2 (in/in) Discharge Instruction: Apply Endoform as directed Secondary Dressing Gauze Discharge Instruction: As directed: dry, moistened with saline or moistened with Dakins Solution mepitil one Secured With Compression Wrap 3-LAYER WRAP - Profore Lite LF 3 Multilayer Compression Bandaging System Discharge Instruction: Apply 3 multi-layer wrap  as prescribed. Compression Stockings Add-Ons   Electronic Signature(s) Signed: 05/12/2022 9:45:44 AM By: Carlene Coria RN Entered By: Carlene Coria on 05/11/2022 10:35:04 Plasencia, [name] ([id number]) -------------------------------------------------------------------------------- Vitals Details Patient Name: [name] Date of Service: 05/11/2022 10:15 AM Medical Record Number: [id number] Patient Account Number: [id number] Date of Birth/Sex: 1922-12-05 (88 y.o. F) Treating RN: Carlene Coria Primary Care Carlita Whitcomb: Adrian Prows Other Clinician: Referring Kiearra Oyervides: Adrian Prows Treating Osei Anger/Extender: Skipper Cliche in Treatment: 22 Vital Signs Time Taken: 10:23 Temperature (F): 98.2 Height (in): 62 Pulse (bpm): 69 Weight (lbs): 150 Respiratory Rate (breaths/min): 18 Body Mass Index (BMI): 27.4 Blood Pressure (mmHg): 119/68 Reference Range: 80 - 120 mg / dl Electronic Signature(s) Signed: 05/12/2022 9:45:44 AM By: Carlene Coria RN Entered By: Carlene Coria on 05/11/2022 10:23:41

## 2022-05-18 ENCOUNTER — Encounter: Payer: Medicare Other | Admitting: Physician Assistant

## 2022-05-18 DIAGNOSIS — L89513 Pressure ulcer of right ankle, stage 3: Secondary | ICD-10-CM | POA: Diagnosis not present

## 2022-05-18 NOTE — Progress Notes (Signed)
GERALDA, BAUMGARDNER ([id number]) Visit Report for 05/18/2022 Chief Complaint Document Details Patient Name: [name], [name]. Date of Service: 05/18/2022 11:00 AM Medical Record Number: [id number] Patient Account Number: [id number] Date of Birth/Sex: 02/12/1923 (88 y.o. F) Treating RN: Carlene Coria Primary Care Provider: Adrian Prows Other Clinician: Referring Provider: Adrian Prows Treating Provider/Extender: Skipper Cliche in Treatment: 14 Information Obtained from: Patient Chief Complaint Right foot ulcer Electronic Signature(s) Signed: 05/18/2022 11:11:05 AM By: Worthy Keeler PA-C Entered By: Worthy Keeler on 05/18/2022 11:11:05 Petrucci, [name] ([id number]) -------------------------------------------------------------------------------- Problem List Details Patient Name: [name] Date of Service: 05/18/2022 11:00 AM Medical Record Number: [id number] Patient Account Number: [id number] Date of Birth/Sex: 10/06/1923 (88 y.o. 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

## 2022-05-18 NOTE — Progress Notes (Addendum)
[name], [name] ([id number]) Visit Report for 05/18/2022 Arrival Information Details Patient Name: [name], [name] Date of Service: 05/18/2022 11:00 AM Medical Record Number: [id number] Patient Account Number: [id number] Date of Birth/Sex: 1923/08/29 (88 y.o. F) Treating RN: Carlene Coria Primary Care Cayli Escajeda: Adrian Prows Other Clinician: Referring Kameisha Malicki: Adrian Prows Treating Bernadett Milian/Extender: Skipper Cliche in Treatment: 46 Visit Information History Since Last Visit All ordered tests and consults were completed: No Patient Arrived: [name] Added or deleted any medications: No Arrival Time: 11:00 Any new allergies or adverse reactions: No Accompanied By: self Had a fall or experienced change in No Transfer Assistance: None activities of daily living that may affect Patient Identification Verified: Yes risk of falls: Secondary Verification Process Completed: Yes Signs or symptoms of abuse/neglect since last visito No Patient Requires Transmission-Based No Hospitalized since last visit: No Precautions: Implantable device outside of the clinic excluding No Patient Has Alerts: Yes cellular tissue based products placed in the center Patient Alerts: Patient on Blood since last visit: Thinner Has Dressing in Place as Prescribed: Yes NOT DIABETIC Pain Present Now: No ***ELIQUIS*** Electronic Signature(s) Signed: 05/24/2022 2:40:31 PM By: Carlene Coria RN Entered By: Carlene Coria on 05/18/2022 11:26:39 [name], [name] ([id number]) -------------------------------------------------------------------------------- Clinic Level of Care Assessment Details Patient Name: [name] Date of Service: 05/18/2022 11:00 AM Medical Record Number: [id number] Patient Account Number: [id number] Date of Birth/Sex: [birth year] (88 y.o. F) Treating RN: Carlene Coria Primary Care Michah Minton: Adrian Prows Other Clinician: Referring Cobie Marcoux: Adrian Prows Treating Marji Kuehnel/Extender: Skipper Cliche in Treatment: 63 Clinic Level of Care Assessment Items TOOL 1 Quantity Score _0  - Use when EandM and Procedure is performed on INITIAL visit 0 ASSESSMENTS - Nursing Assessment / Reassessment _1  - General Physical Exam (combine w/ comprehensive assessment (listed just below) when performed on new 0 pt. evals) _2  - 0 Comprehensive Assessment (HX, ROS, Risk Assessments, Wounds Hx, etc.) ASSESSMENTS - Wound and Skin Assessment / Reassessment _3  - Dermatologic / Skin Assessment (not related to wound area) 0 ASSESSMENTS - Ostomy and/or Continence Assessment and Care _4  - Incontinence Assessment and Management 0 _5  - 0 Ostomy Care Assessment and Management (repouching, etc.) PROCESS - Coordination of Care _6  - Simple Patient / Family Education for ongoing care 0 _7  - 0 Complex (extensive) Patient / Family Education for ongoing care _8  - 0 Staff obtains Programmer, systems, Records, Test Results / Process Orders _9  - 0 Staff telephones HHA, Nursing Homes / Clarify orders / etc _10  - 0 Routine Transfer to another Facility (non-emergent condition) _11  - 0 Routine Hospital Admission (non-emergent condition) _12  - 0 New Admissions / Biomedical engineer / Ordering NPWT, Apligraf, etc. _13  - 0 Emergency Hospital Admission (emergent condition) PROCESS - Special Needs _14  - Pediatric / Minor Patient Management 0 _15  - 0 Isolation Patient Management _16  - 0 Hearing / Language / Visual special needs _17  - 0 Assessment of Community assistance (transportation, D/C planning, etc.) _18  - 0 Additional assistance / Altered mentation _19  - 0 Support Surface(s) Assessment (bed, cushion, seat, etc.) INTERVENTIONS - Miscellaneous _20  - External ear exam 0 _21  - 0 Patient Transfer (multiple staff / Civil Service fast streamer / Similar devices) _22  - 0 Simple Staple / Suture removal (25 or less) _23  - 0 Complex Staple / Suture removal (26 or more) _24  - 0 Hypo/Hyperglycemic  Management (do not check if billed separately) _25  - 0 Ankle / Brachial Index (ABI) - do not check if billed separately Has the patient been seen at the hospital within  the last three years: Yes Total Score: 0 Level Of Care: ____ [name] ([id number]) Electronic Signature(s) Signed: 05/24/2022 2:40:31 PM By: Carlene Coria RN Entered By: Carlene Coria on 05/18/2022 11:35:13 [name], [name] ([id number]) -------------------------------------------------------------------------------- Compression Therapy Details Patient Name: [name] Date of Service: 05/18/2022 11:00 AM Medical Record Number: [id number] Patient Account Number: [id number] Date of Birth/Sex: [birth year] (88 y.o. F) Treating RN: Carlene Coria Primary Care Leshaun Biebel: Adrian Prows Other Clinician: Referring Kianni Lheureux: Adrian Prows Treating Farhan Jean/Extender: Skipper Cliche in Treatment: 28 Compression Therapy Performed for Wound Assessment: Wound #7 Right,Lateral Malleolus Performed By: Jake Church, RN Compression Type: Three Layer Post Procedure Diagnosis Same as Pre-procedure Electronic Signature(s) Signed: 05/24/2022 2:40:31 PM By: Carlene Coria RN Entered By: Carlene Coria on 05/18/2022 11:34:34 [name], [name] ([id number]) -------------------------------------------------------------------------------- Encounter Discharge Information Details Patient Name: [name] Date of Service: 05/18/2022 11:00 AM Medical Record Number: [id number] Patient Account Number: [id number] Date of Birth/Sex: 16-Dec-1922 (88 y.o. F) Treating RN: Carlene Coria Primary Care Nyoka Alcoser: Adrian Prows Other Clinician: Referring Shavana Calder: Adrian Prows Treating Yanuel Tagg/Extender: Skipper Cliche in Treatment: 66 Encounter Discharge Information Items Discharge Condition: Stable Ambulatory Status: Walker Discharge Destination: Home Transportation: Private Auto Accompanied By:  daughter Schedule Follow-up Appointment: Yes Clinical Summary of Care: Patient Declined Electronic Signature(s) Signed: 05/24/2022 2:40:31 PM By: Carlene Coria RN Entered By: Carlene Coria on 05/18/2022 11:36:23 [name], [name] ([id number]) -------------------------------------------------------------------------------- Lower Extremity Assessment Details Patient Name: [name] Date of Service: 05/18/2022 11:00 AM Medical Record Number: [id number] Patient Account Number: [id number] Date of Birth/Sex: 1923/02/04 (88 y.o. F) Treating RN: Carlene Coria Primary Care Esha Fincher: Adrian Prows Other Clinician: Referring Janaiah Vetrano: Adrian Prows Treating Shelise Maron/Extender: Skipper Cliche in Treatment: 52 Edema Assessment Assessed: [Left: No] [Right: No] [Left: Edema] [Right: :] Calf Left: Right: Point of Measurement: 33 cm From Medial Instep 28 cm Ankle Left: Right: Point of Measurement: 10 cm From Medial Instep 17 cm Vascular Assessment Pulses: Dorsalis Pedis Palpable: [Right:Yes] Electronic Signature(s) Signed: 05/24/2022 2:40:31 PM By: Carlene Coria RN Entered By: Carlene Coria on 05/18/2022 11:33:56 [name], [name] ([id number]) -------------------------------------------------------------------------------- Multi Wound Chart Details Patient Name: [name] Date of Service: 05/18/2022 11:00 AM Medical Record Number: [id number] Patient Account Number: [id number] Date of Birth/Sex: August 23, 1923 (88 y.o. F) Treating RN: Carlene Coria Primary Care Caron Ode: Adrian Prows Other Clinician: Referring Kiev Labrosse: Adrian Prows Treating Deneshia Zucker/Extender: Skipper Cliche in Treatment: 66 Vital Signs Height(in): 62 Pulse(bpm): 72 Weight(lbs): 150 Blood Pressure(mmHg): 109/69 Body Mass Index(BMI): 27.4 Temperature(F): 97.9 Respiratory Rate(breaths/min): 18 Photos: [N/A:N/A] Wound Location: Right, Lateral Malleolus N/A N/A Wounding Event: Gradually  Appeared N/A N/A Primary Etiology: Pressure Ulcer N/A N/A Comorbid History: Cataracts, Congestive Heart Failure, N/A N/A Hypertension, Peripheral Arterial Disease, Osteoarthritis, Neuropathy Date Acquired: 05/12/2021 N/A N/A Weeks of Treatment: 52 N/A N/A Wound Status: Open N/A N/A Wound Recurrence: No N/A N/A Measurements L x W x D (cm) 0.1x0.1x0.1 N/A N/A Area (cm) : 0.008 N/A N/A Volume (cm) : 0.001 N/A N/A % Reduction in Area: 99.30% N/A N/A % Reduction in Volume: 99.60% N/A N/A Classification: Category/Stage III N/A N/A Exudate Amount: None Present N/A N/A Granulation Amount: Large (67-100%) N/A N/A Necrotic Amount: None Present (0%) N/A N/A Exposed Structures: Fascia: No N/A N/A Fat Layer (Subcutaneous Tissue): No Tendon: No Muscle: No Joint: No Bone: No Epithelialization: Large (67-100%) N/A N/A Treatment Notes Electronic Signature(s) Signed: 05/24/2022 2:40:31 PM By: Carlene Coria RN Entered By: Carlene Coria on 05/18/2022 11:34:13 [name], [name] ([id number]) --------------------------------------------------------------------------------  Multi-Disciplinary Care Plan Details Patient Name: [name], [name]. Date of Service: 05/18/2022 11:00 AM Medical Record Number: [id number] Patient Account Number: [id number] Date of Birth/Sex: 1922-12-11 (88 y.o. F) Treating RN: Carlene Coria Primary Care Jakaya Jacobowitz: Adrian Prows Other Clinician: Referring Cray Monnin: Adrian Prows Treating Shantanu Strauch/Extender: Skipper Cliche in Treatment: 45 Active Inactive Pressure Nursing Diagnoses: Knowledge deficit related to causes and risk factors for pressure ulcer development Knowledge deficit related to management of pressures ulcers Potential for impaired tissue integrity related to pressure, friction, moisture, and shear Goals: Patient will remain free from development of additional pressure ulcers Date Initiated: 04/06/2022 Date Inactivated: 04/13/2022 Target Resolution  Date: 04/06/2022 Goal Status: Met Patient/caregiver will verbalize risk factors for pressure ulcer development Date Initiated: 04/06/2022 Target Resolution Date: 04/06/2022 Goal Status: Active Patient/caregiver will verbalize understanding of pressure ulcer management Date Initiated: 04/06/2022 Target Resolution Date: 04/06/2022 Goal Status: Active Interventions: Assess: immobility, friction, shearing, incontinence upon admission and as needed Assess offloading mechanisms upon admission and as needed Assess potential for pressure ulcer upon admission and as needed Notes: Electronic Signature(s) Signed: 05/24/2022 2:40:31 PM By: Carlene Coria RN Entered By: Carlene Coria on 05/18/2022 11:34:05 [name], [name] ([id number]) -------------------------------------------------------------------------------- Pain Assessment Details Patient Name: [name] Date of Service: 05/18/2022 11:00 AM Medical Record Number: [id number] Patient Account Number: [id number] Date of Birth/Sex: Sep 27, 1923 (88 y.o. F) Treating RN: Carlene Coria Primary Care Dreshon Proffit: Adrian Prows Other Clinician: Referring Shandria Clinch: Adrian Prows Treating Ndrew Creason/Extender: Skipper Cliche in Treatment: 54 Active Problems Location of Pain Severity and Description of Pain Patient Has Paino No Site Locations Pain Management and Medication Current Pain Management: Electronic Signature(s) Signed: 05/24/2022 2:40:31 PM By: Carlene Coria RN Entered By: Carlene Coria on 05/18/2022 11:27:28 [name] ([id number]) -------------------------------------------------------------------------------- Patient/Caregiver Education Details Patient Name: [name] Date of Service: 05/18/2022 11:00 AM Medical Record Number: [id number] Patient Account Number: [id number] Date of Birth/Gender: Mar 16, 1923 (88 y.o. F) Treating RN: Carlene Coria Primary Care Physician: Adrian Prows Other Clinician: Referring  Physician: Adrian Prows Treating Physician/Extender: Skipper Cliche in Treatment: 20 Education Assessment Education Provided To: Patient Education Topics Provided Wound/Skin Impairment: Methods: Explain/Verbal Responses: State content correctly Electronic Signature(s) Signed: 05/24/2022 2:40:31 PM By: Carlene Coria RN Entered By: Carlene Coria on 05/18/2022 11:35:38 [name], [name] ([id number]) -------------------------------------------------------------------------------- Wound Assessment Details Patient Name: [name] Date of Service: 05/18/2022 11:00 AM Medical Record Number: [id number] Patient Account Number: [id number] Date of Birth/Sex: Mar 04, 1923 (88 y.o. F) Treating RN: Carlene Coria Primary Care Afsana Liera: Adrian Prows Other Clinician: Referring Nadean Montanaro: Adrian Prows Treating Moroni Nester/Extender: Skipper Cliche in Treatment: 69 Wound Status Wound Number: 7 Primary Pressure Ulcer Etiology: Wound Location: Right, Lateral Malleolus Wound Open Wounding Event: Gradually Appeared Status: Date Acquired: 05/12/2021 Comorbid Cataracts, Congestive Heart Failure, Hypertension, Weeks Of Treatment: 52 History: Peripheral Arterial Disease, Osteoarthritis, Neuropathy Clustered Wound: No Photos Wound Measurements Length: (cm) 0.1 Width: (cm) 0.1 Depth: (cm) 0.1 Area: (cm) 0.008 Volume: (cm) 0.001 % Reduction in Area: 99.3% % Reduction in Volume: 99.6% Epithelialization: Large (67-100%) Tunneling: No Undermining: No Wound Description Classification: Category/Stage III Exudate Amount: None Present Foul Odor After Cleansing: No Slough/Fibrino No Wound Bed Granulation Amount: Large (67-100%) Exposed Structure Necrotic Amount: None Present (0%) Fascia Exposed: No Fat Layer (Subcutaneous Tissue) Exposed: No Tendon Exposed: No Muscle Exposed: No Joint Exposed: No Bone Exposed: No Treatment Notes Wound #7 (Malleolus) Wound Laterality: Right,  Lateral Cleanser Soap and Water Discharge Instruction: Gently cleanse wound with antibacterial soap, rinse and pat dry prior  to dressing wounds Peri-Wound Care Topical [name], [name] ([id number]) Primary Dressing Secondary Dressing Secured With Compression Wrap 3-LAYER WRAP - Profore Lite LF 3 Multilayer Compression Bandaging System Discharge Instruction: Apply 3 multi-layer wrap as prescribed. Compression Stockings Add-Ons Electronic Signature(s) Signed: 05/24/2022 2:40:31 PM By: Carlene Coria RN Entered By: Carlene Coria on 05/18/2022 11:33:18 [name], [name] ([id number]) -------------------------------------------------------------------------------- Vitals Details Patient Name: [name] Date of Service: 05/18/2022 11:00 AM Medical Record Number: [id number] Patient Account Number: [id number] Date of Birth/Sex: 12-22-1922 (88 y.o. F) Treating RN: Carlene Coria Primary Care Melida Northington: Adrian Prows Other Clinician: Referring Orra Nolde: Adrian Prows Treating [name]/Extender: Skipper Cliche in Treatment: 31 Vital Signs Time Taken: 11:26 Temperature (F): 97.9 Height (in): 62 Pulse (bpm): 69 Weight (lbs): 150 Respiratory Rate (breaths/min): 18 Body Mass Index (BMI): 27.4 Blood Pressure (mmHg): 109/69 Reference Range: 80 - 120 mg / dl Electronic Signature(s) Signed: 05/24/2022 2:40:31 PM By: Carlene Coria RN Entered By: Carlene Coria on 05/18/2022 11:27:11

## 2022-05-25 ENCOUNTER — Encounter: Payer: Medicare Other | Admitting: Physician Assistant

## 2022-05-25 DIAGNOSIS — L89513 Pressure ulcer of right ankle, stage 3: Secondary | ICD-10-CM | POA: Diagnosis not present

## 2022-05-25 NOTE — Progress Notes (Addendum)
CHAYAH, MCKEE ([id number]) Visit Report for 05/25/2022 Chief Complaint Document Details Patient Name: [name], [name]. Date of Service: 05/25/2022 10:15 AM Medical Record Number: [id number] Patient Account Number: [id number] Date of Birth/Sex: 11/10/1923 (88 y.o. F) Treating RN: Alycia Rossetti Primary Care Provider: Adrian Prows Other Clinician: Referring Provider: Adrian Prows Treating Provider/Extender: Skipper Cliche in Treatment: 44 Information Obtained from: Patient Chief Complaint Right foot ulcer Electronic Signature(s) Signed: 05/25/2022 10:10:43 AM By: Worthy Keeler PA-C Entered By: Worthy Keeler on 05/25/2022 10:10:43 Harries, [name] ([id number]) -------------------------------------------------------------------------------- HPI Details Patient Name: [name] Date of Service: 05/25/2022 10:15 AM Medical Record Number: [id number] Patient Account Number: [id number] Date of Birth/Sex: Jan 09, 1923 (88 y.o. 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 Dr. Amalia Hailey the podiatrist saw her on 07/20/2017 and sent her to the wound center for possible hyperbaric oxygen therapy. past medical history of peripheral vascular disease, varicose veins, status post appendectomy, basal cell carcinoma excision from the left leg, cholecystectomy, pacemaker placement, right lower extremity angiography done by Dr. dew in March 2017 with placement of a stent. there is also note of a successful ablation of the right small saphenous vein done which was reviewed by ultrasound on 10/24/2016. the  patient had a right small saphenous vein ablation done on 10/20/2016. The patient has never been a smoker. She has been seen by Dr. Corene Cornea dew the vascular surgeon who most recently saw her on 06/15/2017 for evaluation of ongoing problems with right leg swelling. She had a lower extremity arterial duplex examination done(02/13/17) which showed patent distal right superficial femoral artery stent and above-the-knee popliteal stent without evidence of restenosis. The ABI was more than 1.3 on the right and more than 1.3 on the left. This was consistent with noncompressible arteries due to medial calcification. The right great toe pressure and PPG waveforms are within normal limits and the left great toe pressure and PPG waveforms are decreased. he recommended she continue to wear her compression stockings and continue with elevation. She is scheduled to have a noninvasive arterial study in the near future 08/16/2017 -- had a lower extremity arterial duplex examination done which showed patent distal right superficial femoral artery stent and above-the-knee popliteal stent without evidence of restenosis. The ABI was more than 1.3 on the right and more than 1.3 on the left. This was consistent with noncompressible arteries due to medial calcification. The right great toe pressure and PPG waveforms are within normal limits and the left great toe pressure and PPG waveforms are decreased. the x-ray of the right ankle has not yet been done 08/24/2017 -- had a right ankle x-ray -- IMPRESSION:1. No fracture, bone lesion or evidence of osteomyelitis. 2. Lateral soft tissue swelling with a soft tissue ulcer. she has not yet seen the vascular surgeon for review 08/31/17 on evaluation today patient's wound appears to be showing signs of improvement. She still with her appointment with vascular in order to review her results of her vascular study and then determine if any intervention would be recommended at that  time. No fevers, chills, nausea, or vomiting noted at this time. She has been tolerating the dressing changes without complication. 09/28/17 on evaluation today patient's  wound appears to show signs of good improvement in regard to the granulation tissue which is surfacing. There is still a layer of slough covering the wound and the posterior portion is still significantly deeper than the anterior nonetheless there has been some good sign of things moving towards the better. She is going to go back to Dr. dew for reevaluation to ensure her blood flow is still appropriate. That will be before her next evaluation with [place] next week. No fevers, chills, nausea, or vomiting noted at this time. Patient does have some discomfort rated to be a 3-4/10 depending on activity specifically cleansing the wound makes it worse. 10/05/2017 -- the patient was seen by Dr. Lucky Cowboy last week and noninvasive studies showed a normal right ABI with brisk triphasic waveforms consistent with no arterial insufficiency including normal digital pressures. The duplex showed a patent distal right SFA stent and the proximal SFA was also normal. He was pleased with her test and thought she should have enough of perfusion for normal wound healing. He would see her back in 6 months time. 12/21/17 on evaluation today patient appears to be doing fairly well in regard to her right lateral ankle wound. Unfortunately the main issue that she is expansion at this point is that she is having some issues with what appears to be some cellulitis in the right anterior shin. She has also been noting a little bit of uncomfortable feeling especially last night and her ankle area. I'm afraid that she made the developing a little bit of an infection. With that being said I think it is in the early stages. 12/28/17 on evaluation today patient's ankle appears to be doing excellent. She's making good progress at this point the cellulitis seems to have improved  after last week's evaluation. Overall she is having no significant discomfort which is excellent news. She does have an appointment with Dr. dew on March 29, 2018 for reevaluation in regard to the stent he placed. She seems to have excellent blood flow in the right lower extremity. 01/19/12 on evaluation today patient's wound appears to be doing very well. In fact she does not appear to require debridement at this point, there's no evidence of infection, and overall from the standpoint of the wound she seems to be doing very well. With that being said I believe that it may be time to switch to different dressing away from the [facility] Dressing she tells me she does have a lot going on her friend actually passed away yesterday and she's also having a lot of issues with her husband this obviously is weighing heavy on her as far as your thoughts and concerns today. 01/25/18 on evaluation today patient appears to be doing fairly well in regard to her right lateral malleolus. She has been tolerating the dressing changes without complication. Overall I feel like this is definitely showing signs of improvement as far as how the overall appearance of the wound is there's also evidence of epithelium start to migrate over the granulation tissue. In general I think that she is progressing nicely as far as the wound is concerned. The only concern she really has is whether or not we can switch to every other week visits in order to avoid having as many appointments as her daughters have a difficult time getting her to her appointments as well as the patient's husband to his he is not doing very well at this point. 02/22/18 on evaluation today patient's right lateral malleolus ulcer  appears to be doing great. She has been tolerating the dressing changes without complication. Overall you making excellent progress at this time. Patient is having no significant discomfort. MATTYE, VERDONE ([id number]) 03/15/18  on evaluation today patient appears to be doing much more poorly in regard to her right lateral ankle ulcer at this point. Unfortunately since have last seen her her husband has passed just a few days ago is obviously weighed heavily on her her daughter also had surgery well she is with her today as usual. There does not appear to be any evidence of infection she does seem to have significant contusion/deep tissue injury to the right lateral malleolus which was not noted previous when I saw her last. It's hard to tell of exactly when this injury occurred although during the time she was spending the night in the hospital this may have been most likely. 03/22/18 on evaluation today patient appears to actually be doing very well in regard to her ulcer. She did unfortunately have a setback which was noted last week however the good news is we seem to be getting back on track and in fact the wound in the core did still have some necrotic tissue which will be addressed at this point today but in general I'm seeing signs that things are on the up and up. She is glad to hear this obviously she's been somewhat concerned that due to the how her wound digressed more recently. 03/29/18 on evaluation today patient appears to be doing fairly well in regard to her right lower extremity lateral malleolus ulcer. She unfortunately does have a new area of pressure injury over the inferior portion where the wound has opened up a little bit larger secondary to the pressure she seems to be getting. She does tell me sometimes when she sleeps at night that it actually hurts and does seem to be pushing on the area little bit more unfortunately. There does not appear to be any evidence of infection which is good news. She has been tolerating the dressing changes without complication. She also did have some bruising in the left second and third toes due to the fact that she may have bump this or injured it although she has  neuropathy so she does not feel she did move recently that may have been where this came from. Nonetheless there does not appear to be any evidence of infection at this time. 04/12/18 on evaluation today patient's wound on the right lateral ankle actually appears to be doing a little bit better with a lot of necrotic docking tissue centrally loosening up in clearing away. However she does have the beginnings of a deep tissue injury on the left lateral malleolus likely due to the fact we've been trying offload the right as much as we have. I think she may benefit from an assistive soft device to help with offloading and it looks like they're looking at one of the doughnut conditions that wraps around the lower leg to offload which I think will definitely do a good job. With that being said I think we definitely need to address this issue on the left before it becomes a wound. Patient is not having significant pain. 04/19/18 on evaluation today patient appears to be doing excellent in regard to the progress she's made with her right lateral ankle ulcer. The left ankle region which did show evidence of a deep tissue injury seems to be resolving there's little fluid noted underneath and a blister there's  nothing open at this point in time overall I feel like this is progressing nicely which is good news. She does not seem to be having significant discomfort at this point which is also good news. 04/25/18-She is here in follow up evaluation for bilateral lateral malleolar ulcers. The right lateral malleolus ulcer with pale subcutaneous tissue exposure, central area of ulcer with tendon/periosteum exposed. The left lateral malleolus ulcer now with central area of nonviable tissue, otherwise deep tissue injury. She is wearing compression wraps to the left lower extremity, she will place the right lower extremity compression wraps on when she gets home. She will be out of town over the weekend and return next  week and follow-up appointment. She completed her doxycycline this morning 05/03/18 on evaluation today patient appears to be doing very well in regard to her right lateral ankle ulcer in general. At least she's showing some signs of improvement in this regard. Unfortunately she has some additional injury to the left lateral malleolus region which appears to be new likely even over the past several days. Again this determination is based on the overall appearance. With that being said the patient is obviously frustrated about this currently. 05/10/18-She is here in follow-up evaluation for bilateral lateral malleolar ulcers. She states she has purchased offloading shoes/boots and they will arrive tomorrow. She was asked to bring them in the office at next week's appointment so her provider is aware of product being utilized. She continues to sleep on right or left side, she has been encouraged to sleep on her back. The right lateral malleolus ulcer is precariously close to peri-osteum; will order xray. The left lateral malleolus ulcer is improved. Will switch back to santyl; she will follow up next week. 05/17/18 on evaluation today patient actually appears to be doing very well in regard to her malleolus her ulcers compared to last time I saw them. She does not seem to have as much in the way of contusion at this point which is great news. With that being said she does continue to have discomfort and I do believe that she is still continuing to benefit from the offloading/pressure reducing boots that were recommended. I think this is the key to trying to get this to heal up completely. 05/24/18 on evaluation today patient actually appears to be doing worse at this point in time unfortunately compared to her last week's evaluation. She is having really no increased pain which is good news unfortunately she does have more maceration in your theme and noted surrounding the right lateral ankle the left  lateral ankle is not really is erythematous I do not see signs of the overt cellulitis on that side. Unfortunately the wounds do not seem to have shown any signs of improvement since the last evaluation. She also has significant swelling especially on the right compared to previous some of this may be due to infection however also think that she may be served better while she has these wounds by compression wrapping versus continuing to use the Juxta-Lite for the time being. Especially with the amount of drainage that she is experiencing at this point. No fevers, chills, nausea, or vomiting noted at this time. 05/31/18 on evaluation today patient appears to actually be doing better in regard to her right lateral lower extremity ulcer specifically on the malleolus region. She has been tolerating the antibiotic without complication. With that being said she still continues to have issues but a little bit of redness although nothing like  she what she was experiencing previous. She still continues to pressure to her ankle area she did get the problem on offloading boots unfortunately she will not wear them she states there too uncomfortable and she can't get in and out of the bed. Nonetheless at this point her wounds seem to be continually getting worse which is not what we want I'm getting somewhat concerned about her progress and how things are going to proceed if we do not intervene in some way shape or form. I therefore had a very lengthy conversation today about offloading yet again and even made a specific suggestion for switching her to a memory foam mattress and even gave the information for a specific one that they could look at getting if it was something that they were interested in considering. She does not want to be considered for a hospital bed air mattress although honestly insurance would not cover it that she does not have any wounds on her trunk. 06/14/18 on evaluation today both wounds  over the bilateral lateral malleolus her ulcers appear to be doing better there's no evidence of pressure injury at this point. She did get the foam mattress for her bed and this does seem to have been extremely beneficial for her in my pinion. Her daughter states that she is having difficulty getting out of bed because of how soft it is. The patient also relates this to be. Nonetheless I do feel like she's actually doing better. Unfortunately right after and around the time she was getting the mattress she also sustained a fall when she got up to go pick up the phone and ended up injuring her right elbow she has [address] sut[address] place. We are not caring for this currently although home health is going to be taking the sutures out shortly. Nonetheless this may be something that we need to evaluate going forward. It depends on how well it has or has not healed in the end. She also recently saw an orthopedic specialist for an injection in the right shoulder just before her fall unfortunately the fall seems to have worsened her pain. 06/21/18 on evaluation today patient appears to be doing about the same in regard to her lateral malleolus ulcers. Both appear to be just a little bit deeper but again we are clinging away the necrotic and dead tissue which I think is why this is progressing towards a deeper realm as opposed MONCHEL, POLLITT. ([id number]) to improving from my measurement standpoint in that regard. Nonetheless she has been tolerating the dressing changes she absolutely hates the memory foam mattress topper that was obtained for her nonetheless I do believe this is still doing excellent as far as taking care of excess pressure in regard to the lateral malleolus regions. She in fact has no pressure injury that I see whereas in weeks past it was week by week I was constantly seeing new pressure injuries. Overall I think it has been very beneficial for her. 07/03/18; patient arrives in my clinic  today. She has deep punched out areas over her bilateral lateral malleoli. The area on the right has some more depth. We spent a lot of time today talking about pressure relief for these areas. This started when her daughter asked for a prescription for a memory foam mattress. I have never written a prescription for a mattress and I don't think insurances would pay for that on an ordinary bed. In any case he came up that she has foam  boots that she refuses to wear. I would suggest going to these before any other offloading issues when she is in bed. They say she is meticulous about offloading this the rest of the day 07/10/18- She is seen in follow-up evaluation for bilateral, lateral malleolus ulcers. There is no improvement in the ulcers. She has purchased and is sleeping on a memory foam mattress/overlay, she has been using the offloading boots nightly over the past week. She has a follow up appointment with vascular medicine at the end of October, in my opinion this follow up should be expedited given her deterioration and suboptimal TBI results. We will order plain film xray of the left ankle as deeper structures are palpable; would consider having MRI, regardless of xray report(s). The ulcers will be treated with iodoflex/iodosorb, she is unable to safely change the dressings daily with santyl. 07/19/18 on evaluation today patient appears to be doing in general visually well in regard to her bilateral lateral malleolus ulcers. She has been tolerating the dressing changes without complication which is good news. With that being said we did have an x-ray performed on 07/12/18 which revealed a slight loosen see in the lateral portion of the distal left fibula which may represent artifact but underline lytic destruction or osteomyelitis could not be excluded. MRI was recommended. With that being said we can see about getting the patient scheduled for an MRI to further evaluate this area. In fact we have  that scheduled currently for August 20 19,019. 07/26/18 on evaluation today patient's wound on the right lateral ankle actually appears to be doing fairly well at this point in my pinion. She has made some good progress currently. With that being said unfortunately in regard to the left lateral ankle ulcer this seems to be a little bit more problematic at this time. In fact as I further evaluated the situation she actually had bone exposed which is the first time that's been the case in the bone appear to be necrotic. Currently I did review patient's note from Dr. Bunnie Domino office with [facility]. He stated that ABI was 1.26 on the right and 0.95 on the left with good waveforms. Her perfusion is stable not reduced from previous studies and her digital waveforms were pretty good particularly on the right. His conclusion upon review of the note was that there was not much she could do to improve her perfusion and he felt she was adequate for wound healing. His suggestion was that she continued to see [place] and consider a synthetic skin graft if there was no underlying infection. He plans to see her back in six months or as needed. 08/01/18 on evaluation today patient appears to be doing better in regard to her right lateral ankle ulcer. Her left lateral ankle ulcer is about the same she still has bone involvement in evidence of necrosis. There does not appear to be evidence of infection at this time On the right lateral lower extremity. I have started her on the Augmentin she picked this up and started this yesterday. This is to get her through until she sees infectious disease which is scheduled for 08/12/18. 08/06/18 on evaluation today patient appears to be doing rather well considering my discussion with patient's daughter at the end of last week. The area which was marked where she had erythema seems to be improved and this is good news. With that being said overall the patient seems  to be making good improvement when it  comes to the overall appearance of the right lateral ankle ulcer although this has been slow she at least is coming around in this regard. Unfortunately in regard to the left lateral ankle ulcer this is osteomyelitis based on the pathology report as well is bone culture. Nonetheless we are still waiting CT scan. Unfortunately the MRI we originally ordered cannot be performed as the patient is a pacemaker which I had overlooked. Nonetheless we are working on the CT scan approval and scheduling as of now. She did go to the hospital over the weekend and was placed on IV Cefzo for a couple of days. Fortunately this seems to have improved the erythema quite significantly which is good news. There does not appear to be any evidence of worsening infection at this time. She did have some bleeding after the last debridement therefore I did not perform any sharp debridement in regard to left lateral ankle at this point. Patient has been approved for a snap vac for the right lateral ankle. 08/14/18; the patient with wounds over her bilateral lateral malleoli. The area on the right actually looks quite good. Been using a snap back on this area. Healthy granulation and appears to be filling in. Unfortunately the area on the left is really problematic. 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 Also noted to have cellulitis. She saw Dr. Novella Olive of infectious disease today and was put on linezolid. We are able to verify this with her pharmacy. She is completed the Augmentin that she was already on. We've been using Iodoflex to this area 08/23/18 on evaluation today patient's wounds both actually appear to be doing better compared to my prior evaluations. Fortunately she showing signs of good improvement in regard to the overall wound status especially where were using the snap vac on the right. In  regard to left lateral malleolus the wound bed actually appears to be much cleaner than previously noted. I do not feel any phone directly probed during evaluation today and though there is tendon noted this does not appear to be necrotic it's actually fairly good as far as the overall appearance of the tendon is concerned. In general the wound bed actually appears to be doing significantly better than it was previous. Patient is currently in the care of [name] and I did review that note today. He actually has her on two weeks of linezolid and then following the patient will be on 1-2 months of Keflex. That is the plan currently. She has been on antibiotics therapy as prescribed by myself initially starting on July 30, 2018 and has been on that continuously up to this point. 08/30/18 on evaluation today patient actually appears to be doing much better in regard to her right lateral malleolus ulcer. She has been tolerating the dressing changes specifically the snap vac without complication although she did have some issues with the seal currently. Apparently there was some trouble with getting it to maintain over the past week past Sunday. Nonetheless overall the wound appears better in regard to the right lateral malleolus region. In regard to left lateral malleolus this actually show some signs of additional granulation although there still tendon noted in the base of the wound this appears to be healthy not necrotic in any way whatsoever. We are considering potentially using a snap vac for the left lateral malleolus as well the product wrap from [employer], [place], was present in  the clinic today we're going to see this patient I did have her come in with me after obtaining consent from the patient and her daughter in order to look at the wound and see if there's any recommendation one way or another as to whether or not they felt the snapback could be beneficial for the left lateral malleolus region.  But the conclusion was that it might be but that this is definitely a little bit deeper wound than what traditionally would be utilized for a snap vac. 09/06/18 on evaluation today patient actually appears to be doing excellent in my pinion in regard to both ankle ulcers. She has been tolerating the dressing changes without complication which is great news. Specifically we have been using the snap vac. In regard to the right ankle I'm not even sure that this is going to be necessary for today and following as the wound has filled in quite nicely. In regard to the left ankle I do believe SEASON, ASTACIO ([id number]) that we're seeing excellent epithelialization from the edge as well as granulation in the central portion the tendon is still exposed but there's no evidence of necrotic bone and in general I feel like the patient has made excellent progress even compared to last week with just one week of the snap vac. 09/11/18; this is a patient who has wounds on her bilateral lateral malleoli. Initially both of these were deep stage IV wounds in the setting of chronic arterial insufficiency. She has been revascularized. As I understand think she been using snap vacs to both of these wounds however the area on the right became more superficial and currently she is only using it on the left. Using silver collagen on the right and silver collagen under the back on the left I believe 09/19/18 on evaluation today patient actually appears to be doing very well in regard to her lateral malleolus or ulcers bilaterally. She has been tolerating the dressing changes without complication. Fortunately there does not appear to be any evidence of infection at this time. Overall I feel like she is improving in an excellent manner and I'm very pleased with the fact that everything seems to be turning towards the better for her. This has obviously been a long road. 09/27/18 on evaluation today patient actually appears  to be doing very well in regard to her bilateral lateral malleolus ulcers. She has been tolerating the dressing changes without complication. Fortunately there does not appear to be any evidence of infection at this time which is also great news. No fevers, chills, nausea, or vomiting noted at this time. Overall I feel like she is doing excellent with the snap vac on the left malleolus. She had 40 mL of fluid collection over the past week. 10/04/18 on evaluation today patient actually appears to be doing well in regard to her bilateral lateral malleolus ulcers. She continues to tolerate the dressing changes without complication. One issue that I see is the snap vac on the left lateral malleolus which appears to have sealed off some fluid underlying this area and has not really allowed it to heal to the degree that I would like to see. For that reason I did suggest at this point we may want to pack a small piece of packing strip into this region to allow it to more effectively wick out fluid. 10/11/18 in general the patient today does not feel that she has been doing very well. She's been a little bit lethargic and  subsequently is having bodyaches as well according to what she tells me today. With that being said overall she has been concerned with the fact that something may be worsening although to be honest her wounds really have not been appearing poorly. She does have a new ulcer on her left heel unfortunately. This may be pressure related. Nonetheless it seems to me to have potentially started at least as a blister I do not see any evidence of deep tissue injury. In regard to the left ankle the snap vac still seems to be causing the ceiling off of the deeper part of the wound which is in turn trapping fluid. I'm not extremely pleased with the overall appearance as far as progress from last week to this week therefore I'm gonna discontinue the snap vac at this point. 10/18/18 patient unfortunately  this point has not been feeling well for the past several days. She was seen by Grayland Ormond her primary care provider who is a Librarian, academic at [facility]. Subsequently she states that she's been very weak and generally feeling malaise. No fevers, chills, nausea, or vomiting noted at this time. With that being said bloodwork was performed at the PCP office on the 11th of this month which showed a white blood cell count of 10.7. This was repeated today and shows a white blood cell count of 12.4. This does show signs of worsening. Coupled with the fact that she is feeling worse and that her left ankle wound is not really showing signs of improvement I feel like this is an indication that the osteomyelitis is likely exacerbating not improving. Overall I think we may also want to check her C-reactive protein and sedimentation rate. Actually did call [facility] office this afternoon while the patient was in the office here with me. Subsequently based on the findings we discussed treatment possibilities and I think that it is appropriate for [place] to go ahead and initiate treatment with doxycycline which I'm going to do. Subsequently he did agree to see about adding a CRP and sedimentation rate to her orders. If that has not already been drawn to where they can run it they will contact the patient she can come back to have that check. They are in agreement with plan as far as the patient and her daughter are concerned. Nonetheless also think we need to get in touch with Dr. Henreitta Leber office to see about getting the patient scheduled with him as soon as possible. 11/08/18 on evaluation today patient presents for follow-up concerning her bilateral foot and ankle ulcers. I did do an extensive review of her chart in epic today. Subsequently she was seen by [name] he did initiate Cefepime IV antibiotic therapy. Subsequently she had some issues with her PICC line this had to be removed because it  was coiled and then replaced. Fortunately that was now settled. Unfortunately she has continued have issues with her left heel as well as the issues that she is experiencing with her bilateral lateral malleolus regions. I do believe however both areas seem to be doing a little bit better on evaluation today which is good news. No fevers, chills, nausea, or vomiting noted at this time. She actually has an angiogram schedule with Dr. dew on this coming Monday, November 11, 2018. Subsequently the patient states that she is feeling much better especially than what she was roughly 2 weeks ago. She actually had to cancel an appointment because she was feeling so poorly. No fevers,  chills, nausea, or vomiting noted at this time. 11/15/18 on evaluation today patient actually is status post having had her angiogram with Dr. dew Monday, four days ago. 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 He had to go and work on several areas of the vasculature fortunately he was able to obtain no more than a 30% residual stenosis throughout post procedure. I reviewed this note today. I think this will definitely help with healing at this time. Fortunately there does not appear to be any signs of infection and I do feel like ratio already has a better appearance to it. 11/22/18 upon evaluation today patient actually appears to be doing very well in regard to her wounds in general. The right lateral malleolus looks excellent the heel looks better in the left lateral malleolus also appears to be doing a little better. With that being said the right second toe actually appears to be open and training we been watching this is been dry and stable but now is open. 12/03/2018 Seen today for follow-up and management of multiple bilateral lower extremity wounds. New pressure injury of the great toe which is closed at this time. Wound of the right distal second toe appears larger today with deep undermining  and a pocket of fluid present within the undermining region. Left and right malleolus is wounds are stable today with no signs and symptoms of infection.Denies any needs or concerns during exam today. 12/13/18 on evaluation today patient appears to be doing somewhat better in regard to her left heel ulcer. She also seems to be completely healed in regard to the right lateral malleolus ulcer. The left malleolus ulcer is smaller what unfortunately the wounds which are new over the first and second toes of the right foot are what are most concerning at this point especially the second. Both areas did require sharp debridement today. 12/20/18 on evaluation today patient's wound actually appears to be doing better in regard to left lateral ankle and her right lateral ankle continues to remain healed. The hill ulcer on the left is improved. She does have improvement noted as well in regard to both toe ulcers. Overall I'm very pleased in this regard. No fevers, chills, nausea, or vomiting noted at this time. 12/23/18 on evaluation today patient is seen after she had her toenails trimmed at the podiatrist office due to issues with her right great toe. There was what appeared to be dark eschar on the surface of the wound which had her in the podiatrist concerned. Nonetheless as I remember that during the last office visit I had utilize silver nitrate of this area I was much less concerned about the situation. Subsequently I was able to clean off much of this tissue without any complication today. This does not appear to show any signs of infection and actually look somewhat better Somera, [name] ([id number]) compared to last time post debridement. Her second toe on the right foot actually had callous over and there did appear still be some fluid underneath this that would require debridement today. 12/27/18 on evaluation today patient actually appears to be showing signs of improvement at all locations. Even  the left lateral ankle although this is not quite as great as the other sites. Fortunately there does not appear to be any signs of infection at this time and both of her toes on the right foot seem to be showing signs of improvement which is good news and very pleased  in this regard. 01/03/19 on evaluation today patient appears to be doing better for the most part in regard to her wounds in particular. There does not appear to be any evidence of infection at this time which is good news. Fortunately there is no sign of really worsening anywhere except for the right great toe which she does have what appears to be a bruise/deep tissue injury which is very superficial and already resolving. I'm not sure where this came from I questioned her extensively and she does not recall what may have happened with this. Other than that the patient seems to be doing well even the left lateral ankle ulcer looks good and is getting smaller. 01/10/19 on evaluation today patient appears to be doing well in regard to her left heel wound and both of her toe wounds. Overall I feel like there is definitely improvement here and I'm happy in that regard. With that being said unfortunately she is having issues with the left lateral malleolus ulcer which unfortunately still has a lot of depth to it. This is gonna be a very difficult wound for [place] to be able to truly get to heal. I may want to consider some type of skin substitute to see if this would be of benefit for her. I'll discuss this with her more the next visit most likely. This was something I thought about more at the end of the visit when I was Artie out of the room and the patient had been discharged. 01/17/19 on evaluation today patient appears to be doing very well in regard to her wounds in general. She's been making excellent progress at this time. Fortunately there's no sign of infection at this time either. No fevers, chills, nausea, or vomiting noted at this  time. The biggest issue is still her left lateral malleolus where it appears to be doing well and is getting smaller but still shows a small corner where this is deeper and goes down into what appears to be the joint space. Nonetheless this is taking much longer to heal although it still looks better in smaller than previous evaluations. 01/24/19 on evaluation today patient's wounds actually appear to be doing rather well in general overall. She did require some sharp debridement in regard to the right great toe but everything else appears to be doing excellent no debridement was even necessary. No fevers, chills, nausea, or vomiting noted at this time. 01/31/19 on evaluation today patient actually appears to be doing much better in regard to her left foot wound on the heel as well as the ankle. The right great toe appears to be a little bit worse today this had callous over and trapped a lot of fluid underneath. Fortunately there's no signs of infection at any site which is great news. 02/07/19 on evaluation today patient actually appears to be doing decently well in regard to all of her ulcers at this point. No sharp debridement was required she is a little bit of hyper granulation in regard to the left lateral ankle as well as the left heel but the hill itself is almost completely healed which is excellent news. Overall been very pleased in this regard. 02/14/19 on evaluation today patient actually appears to be doing very well in regard to her ulcers on the right first toe, left lateral malleolus, and left heel. In fact the heel is almost completely healed at this point. The patient does not show any signs of infection which is good news. Overall very  pleased with how things have progressed. 04/18/19 Telehealth Evaluation During the COVID-19 National Emergency: Verbal Consent: Obtained from patient Allergies: reviewed and the active list is current. Medication changes: patient has no current  medication changes. COVID-19 Screening: 1. Have you traveled internationally or on a cruise ship in the last 14 dayso No 2. Have you had contact with someone with or under investigation for COVID-19o No 3. Have you had a fever, cough, sore throat, or experiencing shortness of breatho No on evaluation today actually did have a visit with this patient through a telehealth encounter with her home health nurse. Subsequently it was noted that the patient actually appears to be doing okay in regard to her wounds both the right great toe as well as the left lateral malleolus have shown signs of improvement although this in your theme around the left lateral malleolus there eschar coverings for both locations. The question is whether or not they are actually close and whether or not home health needs to discharge the patient or not. Nonetheless my concern is this point obviously is that without actually seeing her and being able to evaluate this directly I cannot ensure that she is completely healed which is the question that I'm being asked. 04/22/19 on evaluation today patient presents for her first evaluation since last time I saw her which was actually February 14, 2019. I did do a telehealth visit last week in which point it was questionable whether or not she may be healed and had to bring her in today for confirmation. With that being said she does seem to be doing quite well at this point which is good news. There does not appear to be any drainage in the deed I believe her wounds may be healed. Readmission: 09/04/2019 on evaluation today patient appears to be doing unfortunately somewhat more poorly in regard to her left foot ulcer secondary to a wound that began on 08/21/2019 at least when she first noticed this. Fortunately she has not had any evidence of active infection at this time. Systemically. I also do not necessarily see any evidence of infection at the blister/wound site on the first  metatarsal head plantar aspect. This almost appears to be something that may have just rubbed inappropriately causing this to breakdown. They did not want a wait too long to come in to be seen as again she had significant issues in the past with wounds that took quite a while to heal in fact it was close to 2 years. Nonetheless this does not appear to be quite that bad but again we do need to remove some of the necrotic tissue from the surface of the wound to tell exactly the extent. She does not appear to have any significant arterial disease at this point and again her last ABIs and TBI's are recorded above in the alert section her left ABI was 1.27 with a TBI of 0.72 to the right ABI 1.08 with a TBI of 0.39. Other than this the patient has been doing quite well since I last saw her and that was in May 2020. 09/11/2019 on evaluation today patient appeared to be doing very well with regard to her plantar foot ulcer on the left. In fact this appears to be almost completely healed which is awesome. That is after just 1 week of intervention. With that being said there is no signs of active infection at this time. AMMY, LIENHARD ([id number]) 09/18/2019 on evaluation today patient actually appears to be doing  excellent in fact she is completely healed based on what I am seeing at this point. Fortunately there is no signs of active infection at this time and overall patient is very pleased to hear that this area has healed so quickly. Readmission: 05/13/2021 upon evaluation today this patient presents for reevaluation here in the clinic. This is a wound that actually we previously took care of. She had 1 on the right ankle and the left the left turned out to be be harder due to to heal but nonetheless is doing great at this point as the right that has reopened and it was noted first just several weeks ago with a scab over it and came off in just the past few days. Fortunately there does not appear to  be any obvious evidence of significant active infection at this time which is great news. No fevers, chills, nausea, vomiting, or diarrhea. 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 There does not appear to be any signs of this interfering in any way with her wound. She does have swelling we previously had compression socks for her ordered but again it does not look like she wears these on a regular basis by any means. 05/26/2021 upon evaluation today patient appears to be doing well with regard to her wound which is actually showing signs of excellent improvement. There does not appear to be any signs of active infection which is great news and overall very pleased with where things stand today. No fevers, chills, nausea, vomiting, or diarrhea. 06/02/2021 upon evaluation today patient's wound actually showing signs of excellent improvement. Fortunately there does not appear to be any signs of active infection which is great news. I think the patient is making good progress with regard to her wounds in general. 06/09/2021 upon evaluation today patient appears to be doing excellent in regard to her wounds currently. Fortunately there does not appear to be any signs of active infection which is great news. No fevers, chills, nausea, vomiting, or diarrhea. Overall extremely pleased with where things stand today. I think the patient is making excellent progress. 06/16/2021 upon evaluation today patient appears to be doing well in regard to her wound. This is going require little bit of debridement today and that was discussed with the patient. Otherwise she seems to be doing quite well and I am actually very pleased with where things stand at this point. No fevers, chills, nausea, vomiting, or diarrhea. 06/23/2021 upon evaluation today patient appears to be doing well with regard to her wounds. She has been tolerating the dressing changes without complication.  Fortunately there does not appear to be any evidence of infection and she has not had air in her home which she actually lives at an assisted living that got fixed this morning. With that being said because of that her wrap has been extremely hot and bothersome for her over the past week. 06/30/2021 upon evaluation today patient is actually making excellent progress in regard to her ankle ulcer. She has been tolerating the dressing changes without complication and overall extremely pleased with where things stand there does not appear to be any evidence of active infection which is great news. No fevers, chills, nausea, vomiting, or diarrhea. 07/07/21 upon evaluation today patients and culture on the right actually appears to be doing quite well. There does not appear to be any signs of infection and overall very pleased with where things stand today. No fevers,  chills, nausea, or vomiting noted at this time. 07/14/2021 unfortunately the patient today has some evidence of deep tissue injury and pressure getting to the ankle region. Again I am not exactly sure what is going on here but this is very similar to issues that we have had in the past. I explained to the patient that she needs to be very mindful of exactly what is happening I think sleeping in bed is probably the main issue here although there could be other culprits I am not sure what else would potentially lead to this kind of a problem for her. 07/21/2021 upon evaluation today patient's wound actually showing signs of improvement compared to last week. Fortunately there does not appear to be any signs of active infection which is great news and overall very pleased with where things stand in that regard. With that being said I do believe that she is continuing to show signs of overall of getting better although I think this is still basically about what we were 2 weeks ago due to the worsening and now improvement. 07/28/2021 upon evaluation  today patient appears to be doing well with regard to her wound. She does have some slough buildup on the surface of the wound which I would have to manage today. Fortunately there is no sign of active infection at this time. No fevers, chills, nausea, vomiting, or diarrhea. 08/04/2021 upon evaluation today patient appears to be doing about the same in regard to her wound. To be perfectly honest I am beginning to be a little bit concerned about the overall appearance of the wound bed. I do think possibly taking a sample right around the margin of the wound could be beneficial for her as far as identifying anything such as an inflammatory process or to be honest even a skin tag cancer type process that may be of concern here. Fortunately there does not appear to be any evidence of active infection at this time which is great news she is not having any pain also great news. 08/11/2021 upon evaluation today patient appears to be doing well with regard to her wound. The good news is I did review her biopsy results and it showed some inflammatory mixed findings but nothing that appeared to be malignant which is great news. Overall this is more of a chronic venous stasis type issue which again is more what we have been treating. Nonetheless I just wanted to make sure before going forward that there was not anything more untoward going on at this point. 08/18/2021 upon evaluation today patient appears to be doing well with regard to her ankle ulcer. Fortunately there does not appear to be any signs of active infection at this time which is great overall wound is dramatically improved compared to last week. Since last week I have actually placed her on doxycycline and subsequently this is a good option as far as the findings are concerned at this point. I do believe that the positive result of MRSA is definitely something that needed to be addressed and the good news is The doxycycline is doing a good job of doing  this. the doxycycline is doing that. There does not appear to be any evidence of active infection systemically which is great news. 08/25/2021 upon evaluation today patient appears to be doing well with regard to her wound. I feel like we are finally get back on track as far as healing is concerned I am much happier with the overall appearance today. I  do think that she is tolerating the dressing changes without complication which is great news. We have been using Hydrofera Blue which I think is a good option. The good news is she is also doing great in regard to her compression sock on the left which is a zipper compression that seems to be doing a great job keeping her edema under good control. 09/01/2021 upon evaluation today patient appears to be doing well with regard to her wound. Infection seems to be under much better control which is great news and very pleased in that regard. Fortunately there does not appear to be any signs of infection currently. ELLEAN, FIRMAN ([id number]) 09/08/2021 upon evaluation today patient actually appears to be making good progress in regard to her wound. She has been tolerating the dressing changes without complication. Fortunately there does not appear to be any evidence of active infection at this time which is great news as well. No fevers, chills, nausea, vomiting, or diarrhea. 09/22/2021 upon evaluation today patient appears to be doing well with regard to her wound although is very slow to heal. We have not looked into Apligraf yet I think that is something that we should see about doing. 09/29/2021 upon evaluation today patient's wound is actually showing signs of doing about the same. I am not seeing any evidence of worsening but also no significant evidence of improvement. We did gain approval for the organogenesis products all except for Apligraf as covered by her insurance. With that being said I do think that we can go ahead and proceed with the  NuShield if the patient and her family in agreement of the plan I discussed that with him today she does have a 20% coinsurance which we also discussed. 10/06/2021 upon evaluation today patient appears to unfortunately be doing a little bit worse she appears to be infected based on what I am seeing. Fortunately there does not appear to be any signs of active infection at this time which is great news. Unfortunately it does appear to be some evidence of around the wound edge indicated by way of erythema and warmth as well as redness 10/13/2021 upon evaluation today patient actually appears to be doing excellent in regard to her ankle ulcer compared to what it was. 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 I think were fine to go ahead and apply the NuShield today. 10/20/2021 upon evaluation today patient appears to be doing excellent in fact the NuShield seems to have done all some for her thus far. I am actually very pleased with where we stand and overall I think that she is making great progress. There is no evidence of active infection at this time. 11/23; patient presents for follow-up. She has no issues or complaints today. She denies signs of infection. She reports stability in her wound healing. 11/03/2021 upon evaluation today patient appears to be doing well with regard to her wounds. She has been tolerating the dressing changes without complication. Fortunately there is no signs of active infection at this time. 11/10/2021 upon evaluation today patient appears to be doing well with regard to her right ankle which is actually showing signs of improvement with the NuShield I am very pleased. Subsequently in regards to the left ankle this appears to be doing okay with no  evidence of issue here  either. With that being said she has a significant contusion on the left leg from having fallen 2 days ago. She tells me that she was using her walker going to the closet and then when she got to the closet turned around to get something out at which point she fell according to the story. Nonetheless she does have a hematoma just below her knee on the anterior portion of her shin. My hope is that this will not open until wound although we do need I think Some compression over it also think that we need to have her use an ice as well to help with the swelling and prevent this from getting worse. The last thing she needs is a wound opened up here. 11/17/2021 upon evaluation patient's right ankle actually showing signs of improvement based on what I see currently there is a lot of new skin growth coming in which is great news. Nonetheless I do feel like that the patient is showing improvement as well in regard to her left leg with the use of the Tubigrip it swollen but not as bruised as what I would have expected after what I was seeing last week. Nonetheless I do think doubling up on the Tubigrip would probably be beneficial to try to keep some of the edema under better control here. 11/24/2021 upon evaluation today patient appears to be doing excellent in regard to her wound. This is actually measuring significantly smaller which is great news. Fortunately I do not see any signs of active infection locally nor systemically at this point. Overall I am very pleased with how the NuShield is doing. 12/30; wound bed looks healthy however not much change in overall wound volume. We applied Nushield again today in the standard fashion 12/08/2021 upon evaluation today patient's wound actually showing signs of good improvement and I am actually very pleased with where we stand today as well. I do not see any evidence of active infection locally nor systemically which is great news. Unfortunately she has been  having some issues with stomach upset and diarrhea today. 12/15/2021 upon evaluation today patient appears to be doing excellent in regard to her wound. There is a little bit of dry skin raised up around the edges of the wound but fortunately nothing too significant at this point. Fortunately I do not see any evidence of active infection either which is great news. No fevers, chills, nausea, vomiting, or diarrhea. 12/29/2021 upon evaluation today patient appears to be doing well with regard to her wound. In general I feel like she is getting very close to complete resolution. I think that she is making good progress here as well. Overall I do not see any signs of infection and very little of this appears to actually be open. 01/12/2022 upon evaluation today patient appears to be doing a little bit worse in regard to her wound. It appears that the collagen actually trapped fluid underneath the collagen which got hard and subsequently caused the fluid to back up. This patient has made the wound appear to be larger than what it was previous. With that being said I do not see any signs of obvious infection is not warm to touch and not significantly erythematous either which is good news. Nonetheless I do think that we will need to continue to keep an eye on this. Probably not go put on antibiotic this week but I will be too far from doing so she is actually on  a Z-Pak right now I do not want to really double up on antibiotics. 01/26/2022 upon evaluation today patient's wound is showing signs of improvement although this is slow to turn back around. Fortunately there does not appear to be any evidence of active infection locally or systemically which is great news and overall I am extremely pleased with where we stand today. No fevers, chills, nausea, vomiting, or diarrhea. 02/02/2022 upon evaluation today patient appears to be doing well with regard to her wound. She has been tolerating the dressing changes  without complication. Fortunately there does not appear to be any signs of active infection locally nor systemically at this time which is great news. No fevers, chills, nausea, vomiting, or diarrhea. 3/9; patient presents for follow-up. She has no issues or complaints today. She denies signs of infection. 02/16/2022 upon evaluation today patient appears to be doing well with regard to her wound. She has been tolerating the dressing changes without complication. Fortunately I do not see any evidence of active infection locally nor systemically at this point which is great news. Overall I PAYGE, EPPES ([id number]) think that the patient is making excellent progress in general. The wound is measuring smaller and though there does appear to be some need for sharp debridement today this is minimal compared to what we have noted previous. 02/23/2022 upon evaluation today patient appears to be doing well with regard to her wound she is definitely showing signs of improvement which is great news. 03/02/2022 upon evaluation today patient appears to be doing about the same in regard to her wound. Unfortunately were not seeing significant improvement. With that being said is also not significantly worse which is great news. No fevers, chills, nausea, vomiting, or diarrhea. 03-09-2022 upon evaluation today patient appears to be doing well with regard to her wound. She has been tolerating the dressing changes without complication. Fortunately there does not appear to be any signs of active infection locally or systemically which is great news. 03-16-2022 upon evaluation today patient appears to be doing well with regard to her wound this is measuring smaller and looking much better. I am actually very pleased with where we stand and I think that the patient is making great progress. I do not see any evidence of active infection locally or systemically which is great news. No fevers, chills, nausea, vomiting, or  diarrhea. 03-23-2022 upon evaluation today patient appears to be doing better in regard to her wounds. In fact the wound area actually is showing signs of excellent improvement and actually very pleased with where we stand today. I do not see any evidence of active infection locally nor systemically which is great news. 4/27; comes in today with again thick callus around the wound circumference which I removed with a curette. Some debris on the surface. Overall I do not think quite as good as it was last week by description. Using endoform 04-06-2022 upon evaluation today patient appears to be doing pretty well in regard to her wound. Fortunately I do not see any evidence of active infection locally or systemically which is great news and overall I am pleased in that regard. I do feel like this is measuring smaller postdebridement compared to where we were previous. Overall she is headed in the right direction. She does have an area that is threatening to open and looks a little irritated on the left ankle we did measure this just for the discolored area for now its not draining too much but we  want to monitor and make sure nothing worsens here. 04-13-2022 upon evaluation today patient appears to be doing better in regard to her wound although this is still very slow to heal. We have reapplied for a skin substitute but have not heard anything back as of yet. Fortunately I do not see any evidence of active infection locally or systemically at this time which is great news. 04-20-2022 upon evaluation today patient's wound is actually showing signs of significant improvement which is great news. We did get approval for skin substitutes which I think is an option here for [place] but at the same time I am not even certain that this is going to be necessary especially considering how well things seem to be doing at this point. If she continues healing as we see her right now but I think were probably going to  be able to avoid any need for additional skin substitute. Patient and her daughter are both in agreement with that plan. With that being said she did have a fall she has several areas of contusion fortunately nothing that is can require intervention at this point but nonetheless she did also fracture her rib which is extremely uncomfortable obviously. 04-27-2022 upon evaluation today patient appears to be doing well currently in regard to her wound on the right lateral ankle. Overall I feel like she is actually doing significantly better even compared to last week and very pleased in this regard. I think we are on the right track here. 05-04-2022 upon evaluation today patient appears to be doing well with regard to her wound. She is going require some sharp debridement today to clear away some of the necrotic debris. Fortunately I was able to do this quite easily without significant issue or breakdown here. There is a very small area of opening centrally but really this is pretty close to getting close. 05-11-2022 upon evaluation today patient's wound is actually showing signs of excellent improvement. I do not see any evidence of infection locally or systemically at this time. Overall I think she is very close to complete resolution with just a little bit of debridement to clear away some of the dry crusty area around the edges of the wound currently. 05-18-2022 upon evaluation patient's wound bed actually appears to be likely healed although I cannot be 100% sure I am extremely pleased however with where things stand today. I do not see any evidence of infection locally or systemically which is great news and overall I think you are on the right track here. 05-25-2022 upon evaluation today patient appears to be doing well with regard to her leg ulcer. Fortunately there does not appear to be any signs of active infection locally or systemically at this time which is great news. No fevers, chills, nausea,  vomiting, or diarrhea. With that being said she does appear to be healed in regard to her right lateral ankle which is great news. In regard to her leg unfortunately during the removal of the compression wrap there was a small skin tear that occurred with the scissors. Subsequently the patient does have a minimal amount of bleeding this is going require a couple Steri-Strips in order to seal this up. I do think it should likely heal quite well. Obviously this was unintentional. Nonetheless we do have to definitely monitor make sure that this completely heals up as well I am hopeful that we will be the case by next week to be honest. Electronic Signature(s) Signed: 05/25/2022 11:02:32 AM  By: Worthy Keeler PA-C Entered By: Worthy Keeler on 05/25/2022 11:02:32 Vilar, [name] ([id number]) -------------------------------------------------------------------------------- Physical Exam Details Patient Name: AUTUMM, HATTERY Date of Service: 05/25/2022 10:15 AM Medical Record Number: [id number] Patient Account Number: [id number] Date of Birth/Sex: 1923-03-06 (88 y.o. F) Treating RN: Alycia Rossetti Primary Care Provider: Adrian Prows Other Clinician: Referring Provider: Adrian Prows Treating Provider/Extender: Skipper Cliche in Treatment: 65 Constitutional Well-nourished and well-hydrated in no acute distress. Respiratory normal breathing without difficulty. Psychiatric this patient is able to make decisions and demonstrates good insight into disease process. Alert and Oriented x 3. pleasant and cooperative. Notes Upon inspection patient's wound bed actually showed signs of good granulation and epithelization at this point. Fortunately there does not appear to be any evidence of infection or anything open in regard to the ankle and in regard to the skin tear I was able to put a couple small Steri-Strips over this and I think it will actually do quite well for her as far as  reattachment and healing is concerned. This seems to be doing excellent at this point. Electronic Signature(s) Signed: 05/25/2022 11:02:58 AM By: Worthy Keeler PA-C Entered By: Worthy Keeler on 05/25/2022 11:02:58 Delamater, [name] ([id number]) -------------------------------------------------------------------------------- Problem List Details Patient Name: [name], [name] Date of Service: 05/25/2022 10:15 AM Medical Record Number: [id number] Patient Account Number: [id number] Date of Birth/Sex: 1923/05/20 (88 y.o. 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, stage 3 05/13/2021 No Yes I89.0 Lymphedema, not elsewhere classified 05/13/2021 No Yes S81.811A Laceration without foreign body, right lower leg, initial encounter 05/25/2022 No Yes I87.2 Venous insufficiency (chronic) (peripheral) 05/13/2021 No Yes Z95.0 Presence of cardiac pacemaker 05/13/2021 No Yes Z79.01 Long term (current) use of anticoagulants 05/13/2021 No Yes Inactive Problems ICD-10 Code Description Active Date Inactive Date S80.12XA Contusion of left lower leg, initial encounter 11/10/2021 11/10/2021 Resolved Problems Electronic Signature(s) Signed: 05/25/2022 11:06:08 AM By: Worthy Keeler PA-C Previous Signature: 05/25/2022 10:10:40 AM Version By: Worthy Keeler PA-C Entered By: Worthy Keeler on 05/25/2022 11:06:08 Nudelman, [name] ([id number]) -------------------------------------------------------------------------------- Progress Note Details Patient Name: [name] Date of Service: 05/25/2022 10:15 AM Medical Record Number: [id number] Patient Account Number: [id number] Date of Birth/Sex: 1922-12-09 (88 y.o. 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 Dr. Amalia Hailey the podiatrist saw her on 07/20/2017 and sent her to the wound center for possible hyperbaric oxygen therapy. past medical history of peripheral vascular disease, varicose veins, status post appendectomy, basal cell carcinoma excision from the left leg, cholecystectomy, pacemaker placement, right lower extremity angiography done by Dr. dew in March 2017 with placement of a stent. there is also note of a successful ablation of the right small saphenous vein done which was reviewed by ultrasound on 10/24/2016. the patient had a right small saphenous vein ablation done on 10/20/2016. The patient has never been a smoker. She has been seen by Dr. Corene Cornea dew the vascular surgeon who most recently  saw her on 06/15/2017 for evaluation of ongoing problems with right leg swelling. She had a lower extremity arterial duplex examination done(02/13/17) which showed patent distal right superficial femoral artery stent and above-the-knee popliteal stent without evidence of restenosis. The ABI was more than 1.3 on the right and more than 1.3 on the left. This was consistent with noncompressible arteries due to medial calcification. The right great toe pressure and PPG waveforms are within normal limits and the left great toe pressure and PPG waveforms are decreased. he recommended she continue to wear her compression stockings and continue with elevation. She is scheduled to have a noninvasive arterial study in the near future 08/16/2017  -- had a lower extremity arterial duplex examination done which showed patent distal right superficial femoral artery stent and above-the-knee popliteal stent without evidence of restenosis. The ABI was more than 1.3 on the right and more than 1.3 on the left. This was consistent with noncompressible arteries due to medial calcification. The right great toe pressure and PPG waveforms are within normal limits and the left great toe pressure and PPG waveforms are decreased. the x-ray of the right ankle has not yet been done 08/24/2017 -- had a right ankle x-ray -- IMPRESSION:1. No fracture, bone lesion or evidence of osteomyelitis. 2. Lateral soft tissue swelling with a soft tissue ulcer. she has not yet seen the vascular surgeon for review 08/31/17 on evaluation today patient's wound appears to be showing signs of improvement. She still with her appointment with vascular in order to review her results of her vascular study and then determine if any intervention would be recommended at that time. No fevers, chills, nausea, or vomiting noted at this time. She has been tolerating the dressing changes without complication. 09/28/17 on evaluation today patient's wound appears to show signs of good improvement in regard to the granulation tissue which is surfacing. There is still a layer of slough covering the wound and the posterior portion is still significantly deeper than the anterior nonetheless there has been some good sign of things moving towards the better. She is going to go back to Dr. dew for reevaluation to ensure her blood flow is still appropriate. That will be before her next evaluation with [place] next week. No fevers, chills, nausea, or vomiting noted at this time. Patient does have some discomfort rated to be a 3-4/10 depending on activity specifically cleansing the wound makes it worse. 10/05/2017 -- the patient was seen by Dr. Lucky Cowboy last week and noninvasive studies showed a normal right ABI  with brisk triphasic waveforms consistent with no arterial insufficiency including normal digital pressures. The duplex showed a patent distal right SFA stent and the proximal SFA was also normal. He was pleased with her test and thought she should have enough of perfusion for normal wound healing. He would see her back in 6 months time. 12/21/17 on evaluation today patient appears to be doing fairly well in regard to her right lateral ankle wound. Unfortunately the main issue that she is expansion at this point is that she is having some issues with what appears to be some cellulitis in the right anterior shin. She has also been noting a little bit of uncomfortable feeling especially last night and her ankle area. I'm afraid that she made the developing a little bit of an infection. With that being said I think it is in the early stages. 12/28/17 on evaluation today patient's ankle appears to be doing excellent. She's making good progress at  this point the cellulitis seems to have improved after last week's evaluation. Overall she is having no significant discomfort which is excellent news. She does have an appointment with Dr. dew on March 29, 2018 for reevaluation in regard to the stent he placed. She seems to have excellent blood flow in the right lower extremity. 01/19/12 on evaluation today patient's wound appears to be doing very well. In fact she does not appear to require debridement at this point, there's no evidence of infection, and overall from the standpoint of the wound she seems to be doing very well. With that being said I believe that it may be time to switch to different dressing away from the [facility] Dressing she tells me she does have a lot going on her friend actually passed away yesterday and she's also having a lot of issues with her husband this obviously is weighing heavy on her as far as your thoughts and concerns today. 01/25/18 on evaluation today patient appears to be  doing fairly well in regard to her right lateral malleolus. She has been tolerating the dressing changes without complication. Overall I feel like this is definitely showing signs of improvement as far as how the overall appearance of the wound is there's also evidence of epithelium start to migrate over the granulation tissue. In general I think that she is progressing nicely as far as the wound is concerned. The only concern she really has is whether or not we can switch to every other week visits in order to avoid having as many LUCINA, BETTY ([id number]) appointments as her daughters have a difficult time getting her to her appointments as well as the patient's husband to his he is not doing very well at this point. 02/22/18 on evaluation today patient's right lateral malleolus ulcer appears to be doing great. She has been tolerating the dressing changes without complication. Overall you making excellent progress at this time. Patient is having no significant discomfort. 03/15/18 on evaluation today patient appears to be doing much more poorly in regard to her right lateral ankle ulcer at this point. Unfortunately since have last seen her her husband has passed just a few days ago is obviously weighed heavily on her her daughter also had surgery well she is with her today as usual. There does not appear to be any evidence of infection she does seem to have significant contusion/deep tissue injury to the right lateral malleolus which was not noted previous when I saw her last. It's hard to tell of exactly when this injury occurred although during the time she was spending the night in the hospital this may have been most likely. 03/22/18 on evaluation today patient appears to actually be doing very well in regard to her ulcer. She did unfortunately have a setback which was noted last week however the good news is we seem to be getting back on track and in fact the wound in the core did still have  some necrotic tissue which will be addressed at this point today but in general I'm seeing signs that things are on the up and up. She is glad to hear this obviously she's been somewhat concerned that due to the how her wound digressed more recently. 03/29/18 on evaluation today patient appears to be doing fairly well in regard to her right lower extremity lateral malleolus ulcer. She unfortunately does have a new area of pressure injury over the inferior portion where the wound has opened up a little bit  larger secondary to the pressure she seems to be getting. She does tell me sometimes when she sleeps at night that it actually hurts and does seem to be pushing on the area little bit more unfortunately. There does not appear to be any evidence of infection which is good news. She has been tolerating the dressing changes without complication. She also did have some bruising in the left second and third toes due to the fact that she may have bump this or injured it although she has neuropathy so she does not feel she did move recently that may have been where this came from. Nonetheless there does not appear to be any evidence of infection at this time. 04/12/18 on evaluation today patient's wound on the right lateral ankle actually appears to be doing a little bit better with a lot of necrotic docking tissue centrally loosening up in clearing away. However she does have the beginnings of a deep tissue injury on the left lateral malleolus likely due to the fact we've been trying offload the right as much as we have. I think she may benefit from an assistive soft device to help with offloading and it looks like they're looking at one of the doughnut conditions that wraps around the lower leg to offload which I think will definitely do a good job. With that being said I think we definitely need to address this issue on the left before it becomes a wound. Patient is not having significant pain. 04/19/18 on  evaluation today patient appears to be doing excellent in regard to the progress she's made with her right lateral ankle ulcer. The left ankle region which did show evidence of a deep tissue injury seems to be resolving there's little fluid noted underneath and a blister there's nothing open at this point in time overall I feel like this is progressing nicely which is good news. She does not seem to be having significant discomfort at this point which is also good news. 04/25/18-She is here in follow up evaluation for bilateral lateral malleolar ulcers. The right lateral malleolus ulcer with pale subcutaneous tissue exposure, central area of ulcer with tendon/periosteum exposed. The left lateral malleolus ulcer now with central area of nonviable tissue, otherwise deep tissue injury. She is wearing compression wraps to the left lower extremity, she will place the right lower extremity compression wraps on when she gets home. She will be out of town over the weekend and return next week and follow-up appointment. She completed her doxycycline this morning 05/03/18 on evaluation today patient appears to be doing very well in regard to her right lateral ankle ulcer in general. At least she's showing some signs of improvement in this regard. Unfortunately she has some additional injury to the left lateral malleolus region which appears to be new likely even over the past several days. Again this determination is based on the overall appearance. With that being said the patient is obviously frustrated about this currently. 05/10/18-She is here in follow-up evaluation for bilateral lateral malleolar ulcers. She states she has purchased offloading shoes/boots and they will arrive tomorrow. She was asked to bring them in the office at next week's appointment so her provider is aware of product being utilized. She continues to sleep on right or left side, she has been encouraged to sleep on her back. The right  lateral malleolus ulcer is precariously close to peri-osteum; will order xray. The left lateral malleolus ulcer is improved. Will switch back to santyl; she  will follow up next week. 05/17/18 on evaluation today patient actually appears to be doing very well in regard to her malleolus her ulcers compared to last time I saw them. She does not seem to have as much in the way of contusion at this point which is great news. With that being said she does continue to have discomfort and I do believe that she is still continuing to benefit from the offloading/pressure reducing boots that were recommended. I think this is the key to trying to get this to heal up completely. 05/24/18 on evaluation today patient actually appears to be doing worse at this point in time unfortunately compared to her last week's evaluation. She is having really no increased pain which is good news unfortunately she does have more maceration in your theme and noted surrounding the right lateral ankle the left lateral ankle is not really is erythematous I do not see signs of the overt cellulitis on that side. Unfortunately the wounds do not seem to have shown any signs of improvement since the last evaluation. She also has significant swelling especially on the right compared to previous some of this may be due to infection however also think that she may be served better while she has these wounds by compression wrapping versus continuing to use the Juxta-Lite for the time being. Especially with the amount of drainage that she is experiencing at this point. No fevers, chills, nausea, or vomiting noted at this time. 05/31/18 on evaluation today patient appears to actually be doing better in regard to her right lateral lower extremity ulcer specifically on the malleolus region. She has been tolerating the antibiotic without complication. With that being said she still continues to have issues but a little bit of redness although nothing  like she what she was experiencing previous. She still continues to pressure to her ankle area she did get the problem on offloading boots unfortunately she will not wear them she states there too uncomfortable and she can't get in and out of the bed. Nonetheless at this point her wounds seem to be continually getting worse which is not what we want I'm getting somewhat concerned about her progress and how things are going to proceed if we do not intervene in some way shape or form. I therefore had a very lengthy conversation today about offloading yet again and even made a specific suggestion for switching her to a memory foam mattress and even gave the information for a specific one that they could look at getting if it was something that they were interested in considering. She does not want to be considered for a hospital bed air mattress although honestly insurance would not cover it that she does not have any wounds on her trunk. 06/14/18 on evaluation today both wounds over the bilateral lateral malleolus her ulcers appear to be doing better there's no evidence of pressure injury at this point. She did get the foam mattress for her bed and this does seem to have been extremely beneficial for her in my pinion. Her daughter states that she is having difficulty getting out of bed because of how soft it is. The patient also relates this to be. Nonetheless I do feel like she's actually doing better. Unfortunately right after and around the time she was getting the mattress she also sustained a fall when she got up to go pick up the phone and ended up injuring her right elbow she has 18 sutures in place. We are  not caring for this currently although home health is going to be taking the sutures out shortly. Nonetheless this may be something that we need to evaluate going forward. It depends on LUMEN, BRINLEE ([id number]) how well it has or has not healed in the end. She also recently saw an  orthopedic specialist for an injection in the right shoulder just before her fall unfortunately the fall seems to have worsened her pain. 06/21/18 on evaluation today patient appears to be doing about the same in regard to her lateral malleolus ulcers. Both appear to be just a little bit deeper but again we are clinging away the necrotic and dead tissue which I think is why this is progressing towards a deeper realm as opposed to improving from my measurement standpoint in that regard. Nonetheless she has been tolerating the dressing changes she absolutely hates the memory foam mattress topper that was obtained for her nonetheless I do believe this is still doing excellent as far as taking care of excess pressure in regard to the lateral malleolus regions. She in fact has no pressure injury that I see whereas in weeks past it was week by week I was constantly seeing new pressure injuries. Overall I think it has been very beneficial for her. 07/03/18; patient arrives in my clinic today. She has deep punched out areas over her bilateral lateral malleoli. The area on the right has some more depth. We spent a lot of time today talking about pressure relief for these areas. This started when her daughter asked for a prescription for a memory foam mattress. I have never written a prescription for a mattress and I don't think insurances would pay for that on an ordinary bed. In any case he came up that she has foam boots that she refuses to wear. I would suggest going to these before any other offloading issues when she is in bed. They say she is meticulous about offloading this the rest of the day 07/10/18- She is seen in follow-up evaluation for bilateral, lateral malleolus ulcers. There is no improvement in the ulcers. She has purchased and is sleeping on a memory foam mattress/overlay, she has been using the offloading boots nightly over the past week. She has a follow up appointment with vascular medicine  at the end of October, in my opinion this follow up should be expedited given her deterioration and suboptimal TBI results. We will order plain film xray of the left ankle as deeper structures are palpable; would consider having MRI, regardless of xray report(s). The ulcers will be treated with iodoflex/iodosorb, she is unable to safely change the dressings daily with santyl. 07/19/18 on evaluation today patient appears to be doing in general visually well in regard to her bilateral lateral malleolus ulcers. She has been tolerating the dressing changes without complication which is good news. With that being said we did have an x-ray performed on 07/12/18 which revealed a slight loosen see in the lateral portion of the distal left fibula which may represent artifact but underline lytic destruction or osteomyelitis could not be excluded. MRI was recommended. With that being said we can see about getting the patient scheduled for an MRI to further evaluate this area. In fact we have that scheduled currently for August 20 19,019. 07/26/18 on evaluation today patient's wound on the right lateral ankle actually appears to be doing fairly well at this point in my pinion. She has made some good progress currently. With that being said unfortunately in  regard to the left lateral ankle ulcer this seems to be a little bit more problematic at this time. In fact as I further evaluated the situation she actually had bone exposed which is the first time that's been the case in the bone appear to be necrotic. Currently I did review patient's note from Dr. Bunnie Domino office with [facility]. He stated that ABI was 1.26 on the right and 0.95 on the left with good waveforms. Her perfusion is stable not reduced from previous studies and her digital waveforms were pretty good particularly on the right. His conclusion upon review of the note was that there was not much she could do to improve her perfusion  and he felt she was adequate for wound healing. His suggestion was that she continued to see [place] and consider a synthetic skin graft if there was no underlying infection. He plans to see her back in six months or as needed. 08/01/18 on evaluation today patient appears to be doing better in regard to her right lateral ankle ulcer. Her left lateral ankle ulcer is about the same she still has bone involvement in evidence of necrosis. There does not appear to be evidence of infection at this time On the right lateral lower extremity. I have started her on the Augmentin she picked this up and started this yesterday. This is to get her through until she sees infectious disease which is scheduled for 08/12/18. 08/06/18 on evaluation today patient appears to be doing rather well considering my discussion with patient's daughter at the end of last week. The area which was marked where she had erythema seems to be improved and this is good news. With that being said overall the patient seems to be making good improvement when it comes to the overall appearance of the right lateral ankle ulcer although this has been slow she at least is coming around in this regard. Unfortunately in regard to the left lateral ankle ulcer this is osteomyelitis based on the pathology report as well is bone culture. Nonetheless we are still waiting CT scan. Unfortunately the MRI we originally ordered cannot be performed as the patient is a pacemaker which I had overlooked. Nonetheless we are working on the CT scan approval and scheduling as of now. She did go to the hospital over the weekend and was placed on IV Cefzo for a couple of days. Fortunately this seems to have improved the erythema quite significantly which is good news. There does not appear to be any evidence of worsening infection at this time. She did have some bleeding after the last debridement therefore I did not perform any sharp debridement in regard to left lateral  ankle at this point. Patient has been approved for a snap vac for the right lateral ankle. 08/14/18; the patient with wounds over her bilateral lateral malleoli. The area on the right actually looks quite good. Been using a snap back on this area. Healthy granulation and appears to be filling in. Unfortunately the area on the left is really problematic. 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 Also noted to have cellulitis. She saw Dr. Novella Olive of infectious disease today and was put on linezolid. We are able to verify this with her pharmacy. She is completed the Augmentin that she was already on. We've been using Iodoflex to this area 08/23/18 on evaluation today patient's wounds both actually appear to be  doing better compared to my prior evaluations. Fortunately she showing signs of good improvement in regard to the overall wound status especially where were using the snap vac on the right. In regard to left lateral malleolus the wound bed actually appears to be much cleaner than previously noted. I do not feel any phone directly probed during evaluation today and though there is tendon noted this does not appear to be necrotic it's actually fairly good as far as the overall appearance of the tendon is concerned. In general the wound bed actually appears to be doing significantly better than it was previous. Patient is currently in the care of [name] and I did review that note today. He actually has her on two weeks of linezolid and then following the patient will be on 1-2 months of Keflex. That is the plan currently. She has been on antibiotics therapy as prescribed by myself initially starting on July 30, 2018 and has been on that continuously up to this point. 08/30/18 on evaluation today patient actually appears to be doing much better in regard to her right lateral malleolus ulcer. She has been tolerating the  dressing changes specifically the snap vac without complication although she did have some issues with the seal currently. Apparently there was some trouble with getting it to maintain over the past week past Sunday. Nonetheless overall the wound appears better in regard to the right lateral malleolus region. In regard to left lateral malleolus this actually show some signs of additional granulation although there still tendon noted in the base of the wound this appears to be healthy not necrotic in any way whatsoever. We are considering potentially using a snap vac for the left lateral malleolus as well the product wrap from [employer], [place], was present in the clinic today we're going to see this patient I did have her come in with me after obtaining consent from the patient and her daughter in order to look at the wound and see if there's any recommendation one way or another as to whether or not they felt the snapback could be beneficial for the left lateral malleolus region. But JAHNYA, TRINDADE ([id number]) the conclusion was that it might be but that this is definitely a little bit deeper wound than what traditionally would be utilized for a snap vac. 09/06/18 on evaluation today patient actually appears to be doing excellent in my pinion in regard to both ankle ulcers. She has been tolerating the dressing changes without complication which is great news. Specifically we have been using the snap vac. In regard to the right ankle I'm not even sure that this is going to be necessary for today and following as the wound has filled in quite nicely. In regard to the left ankle I do believe that we're seeing excellent epithelialization from the edge as well as granulation in the central portion the tendon is still exposed but there's no evidence of necrotic bone and in general I feel like the patient has made excellent progress even compared to last week with just one week of the snap vac. 09/11/18; this  is a patient who has wounds on her bilateral lateral malleoli. Initially both of these were deep stage IV wounds in the setting of chronic arterial insufficiency. She has been revascularized. As I understand think she been using snap vacs to both of these wounds however the area on the right became more superficial and currently she is only using it on the left. Using silver  collagen on the right and silver collagen under the back on the left I believe 09/19/18 on evaluation today patient actually appears to be doing very well in regard to her lateral malleolus or ulcers bilaterally. She has been tolerating the dressing changes without complication. Fortunately there does not appear to be any evidence of infection at this time. Overall I feel like she is improving in an excellent manner and I'm very pleased with the fact that everything seems to be turning towards the better for her. This has obviously been a long road. 09/27/18 on evaluation today patient actually appears to be doing very well in regard to her bilateral lateral malleolus ulcers. She has been tolerating the dressing changes without complication. Fortunately there does not appear to be any evidence of infection at this time which is also great news. No fevers, chills, nausea, or vomiting noted at this time. Overall I feel like she is doing excellent with the snap vac on the left malleolus. She had 40 mL of fluid collection over the past week. 10/04/18 on evaluation today patient actually appears to be doing well in regard to her bilateral lateral malleolus ulcers. She continues to tolerate the dressing changes without complication. One issue that I see is the snap vac on the left lateral malleolus which appears to have sealed off some fluid underlying this area and has not really allowed it to heal to the degree that I would like to see. For that reason I did suggest at this point we may want to pack a small piece of packing strip into  this region to allow it to more effectively wick out fluid. 10/11/18 in general the patient today does not feel that she has been doing very well. She's been a little bit lethargic and subsequently is having bodyaches as well according to what she tells me today. With that being said overall she has been concerned with the fact that something may be worsening although to be honest her wounds really have not been appearing poorly. She does have a new ulcer on her left heel unfortunately. This may be pressure related. Nonetheless it seems to me to have potentially started at least as a blister I do not see any evidence of deep tissue injury. In regard to the left ankle the snap vac still seems to be causing the ceiling off of the deeper part of the wound which is in turn trapping fluid. I'm not extremely pleased with the overall appearance as far as progress from last week to this week therefore I'm gonna discontinue the snap vac at this point. 10/18/18 patient unfortunately this point has not been feeling well for the past several days. She was seen by Grayland Ormond her primary care provider who is a Librarian, academic at [facility]. Subsequently she states that she's been very weak and generally feeling malaise. No fevers, chills, nausea, or vomiting noted at this time. With that being said bloodwork was performed at the PCP office on the 11th of this month which showed a white blood cell count of 10.7. This was repeated today and shows a white blood cell count of 12.4. This does show signs of worsening. Coupled with the fact that she is feeling worse and that her left ankle wound is not really showing signs of improvement I feel like this is an indication that the osteomyelitis is likely exacerbating not improving. Overall I think we may also want to check her C-reactive protein and sedimentation rate. Actually did  call [facility] office this afternoon while the patient was in the office  here with me. Subsequently based on the findings we discussed treatment possibilities and I think that it is appropriate for [place] to go ahead and initiate treatment with doxycycline which I'm going to do. Subsequently he did agree to see about adding a CRP and sedimentation rate to her orders. If that has not already been drawn to where they can run it they will contact the patient she can come back to have that check. They are in agreement with plan as far as the patient and her daughter are concerned. Nonetheless also think we need to get in touch with Dr. Henreitta Leber office to see about getting the patient scheduled with him as soon as possible. 11/08/18 on evaluation today patient presents for follow-up concerning her bilateral foot and ankle ulcers. I did do an extensive review of her chart in epic today. Subsequently she was seen by [name] he did initiate Cefepime IV antibiotic therapy. Subsequently she had some issues with her PICC line this had to be removed because it was coiled and then replaced. Fortunately that was now settled. Unfortunately she has continued have issues with her left heel as well as the issues that she is experiencing with her bilateral lateral malleolus regions. I do believe however both areas seem to be doing a little bit better on evaluation today which is good news. No fevers, chills, nausea, or vomiting noted at this time. She actually has an angiogram schedule with Dr. dew on this coming Monday, November 11, 2018. Subsequently the patient states that she is feeling much better especially than what she was roughly 2 weeks ago. She actually had to cancel an appointment because she was feeling so poorly. No fevers, chills, nausea, or vomiting noted at this time. 11/15/18 on evaluation today patient actually is status post having had her angiogram with Dr. dew Monday, four days ago. 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 He had to go and work on  several areas of the vasculature fortunately he was able to obtain no more than a 30% residual stenosis throughout post procedure. I reviewed this note today. I think this will definitely help with healing at this time. Fortunately there does not appear to be any signs of infection and I do feel like ratio already has a better appearance to it. 11/22/18 upon evaluation today patient actually appears to be doing very well in regard to her wounds in general. The right lateral malleolus looks excellent the heel looks better in the left lateral malleolus also appears to be doing a little better. With that being said the right second toe actually appears to be open and training we been watching this is been dry and stable but now is open. 12/03/2018 Seen today for follow-up and management of multiple bilateral lower extremity wounds. New pressure injury of the great toe which is closed at this time. Wound of the right distal second toe appears larger today with deep undermining and a pocket of fluid present within the undermining region. Left and right malleolus is wounds are stable today with no signs and symptoms of infection.Denies any needs or concerns during exam today. 12/13/18 on evaluation today patient appears to be doing somewhat better in regard to her left heel ulcer. She also seems to be completely healed in regard to the right lateral malleolus ulcer. The left malleolus ulcer is smaller what unfortunately the  wounds which are new over the first and second toes of the right foot are what are most concerning at this point especially the second. Both areas did require sharp debridement today. 12/20/18 on evaluation today patient's wound actually appears to be doing better in regard to left lateral ankle and her right lateral ankle continues to remain healed. The hill ulcer on the left is improved. She does have improvement noted as well in regard to both toe ulcers. Overall I'm very pleased in  this regard. No fevers, chills, nausea, or vomiting noted at this time. MARLEAN, MORTELL ([id number]) 12/23/18 on evaluation today patient is seen after she had her toenails trimmed at the podiatrist office due to issues with her right great toe. There was what appeared to be dark eschar on the surface of the wound which had her in the podiatrist concerned. Nonetheless as I remember that during the last office visit I had utilize silver nitrate of this area I was much less concerned about the situation. Subsequently I was able to clean off much of this tissue without any complication today. This does not appear to show any signs of infection and actually look somewhat better compared to last time post debridement. Her second toe on the right foot actually had callous over and there did appear still be some fluid underneath this that would require debridement today. 12/27/18 on evaluation today patient actually appears to be showing signs of improvement at all locations. Even the left lateral ankle although this is not quite as great as the other sites. Fortunately there does not appear to be any signs of infection at this time and both of her toes on the right foot seem to be showing signs of improvement which is good news and very pleased in this regard. 01/03/19 on evaluation today patient appears to be doing better for the most part in regard to her wounds in particular. There does not appear to be any evidence of infection at this time which is good news. Fortunately there is no sign of really worsening anywhere except for the right great toe which she does have what appears to be a bruise/deep tissue injury which is very superficial and already resolving. I'm not sure where this came from I questioned her extensively and she does not recall what may have happened with this. Other than that the patient seems to be doing well even the left lateral ankle ulcer looks good and is getting  smaller. 01/10/19 on evaluation today patient appears to be doing well in regard to her left heel wound and both of her toe wounds. Overall I feel like there is definitely improvement here and I'm happy in that regard. With that being said unfortunately she is having issues with the left lateral malleolus ulcer which unfortunately still has a lot of depth to it. This is gonna be a very difficult wound for [place] to be able to truly get to heal. I may want to consider some type of skin substitute to see if this would be of benefit for her. I'll discuss this with her more the next visit most likely. This was something I thought about more at the end of the visit when I was Artie out of the room and the patient had been discharged. 01/17/19 on evaluation today patient appears to be doing very well in regard to her wounds in general. She's been making excellent progress at this time. Fortunately there's no sign of infection at this time either.  No fevers, chills, nausea, or vomiting noted at this time. The biggest issue is still her left lateral malleolus where it appears to be doing well and is getting smaller but still shows a small corner where this is deeper and goes down into what appears to be the joint space. Nonetheless this is taking much longer to heal although it still looks better in smaller than previous evaluations. 01/24/19 on evaluation today patient's wounds actually appear to be doing rather well in general overall. She did require some sharp debridement in regard to the right great toe but everything else appears to be doing excellent no debridement was even necessary. No fevers, chills, nausea, or vomiting noted at this time. 01/31/19 on evaluation today patient actually appears to be doing much better in regard to her left foot wound on the heel as well as the ankle. The right great toe appears to be a little bit worse today this had callous over and trapped a lot of fluid underneath.  Fortunately there's no signs of infection at any site which is great news. 02/07/19 on evaluation today patient actually appears to be doing decently well in regard to all of her ulcers at this point. No sharp debridement was required she is a little bit of hyper granulation in regard to the left lateral ankle as well as the left heel but the hill itself is almost completely healed which is excellent news. Overall been very pleased in this regard. 02/14/19 on evaluation today patient actually appears to be doing very well in regard to her ulcers on the right first toe, left lateral malleolus, and left heel. In fact the heel is almost completely healed at this point. The patient does not show any signs of infection which is good news. Overall very pleased with how things have progressed. 04/18/19 Telehealth Evaluation During the COVID-19 National Emergency: Verbal Consent: Obtained from patient Allergies: reviewed and the active list is current. Medication changes: patient has no current medication changes. COVID-19 Screening: 1. Have you traveled internationally or on a cruise ship in the last 14 dayso No 2. Have you had contact with someone with or under investigation for COVID-19o No 3. Have you had a fever, cough, sore throat, or experiencing shortness of breatho No on evaluation today actually did have a visit with this patient through a telehealth encounter with her home health nurse. Subsequently it was noted that the patient actually appears to be doing okay in regard to her wounds both the right great toe as well as the left lateral malleolus have shown signs of improvement although this in your theme around the left lateral malleolus there eschar coverings for both locations. The question is whether or not they are actually close and whether or not home health needs to discharge the patient or not. Nonetheless my concern is this point obviously is that without actually seeing her and being  able to evaluate this directly I cannot ensure that she is completely healed which is the question that I'm being asked. 04/22/19 on evaluation today patient presents for her first evaluation since last time I saw her which was actually February 14, 2019. I did do a telehealth visit last week in which point it was questionable whether or not she may be healed and had to bring her in today for confirmation. With that being said she does seem to be doing quite well at this point which is good news. There does not appear to be any drainage in the  deed I believe her wounds may be healed. Readmission: 09/04/2019 on evaluation today patient appears to be doing unfortunately somewhat more poorly in regard to her left foot ulcer secondary to a wound that began on 08/21/2019 at least when she first noticed this. Fortunately she has not had any evidence of active infection at this time. Systemically. I also do not necessarily see any evidence of infection at the blister/wound site on the first metatarsal head plantar aspect. This almost appears to be something that may have just rubbed inappropriately causing this to breakdown. They did not want a wait too long to come in to be seen as again she had significant issues in the past with wounds that took quite a while to heal in fact it was close to 2 years. Nonetheless this does not appear to be quite that bad but again we do need to remove some of the necrotic tissue from the surface of the wound to tell exactly the extent. She does not appear to have any significant arterial disease at this point and again her last ABIs and TBI's are recorded above in the alert section her left ABI was 1.27 with a TBI of 0.72 to the right ABI 1.08 with a TBI of 0.39. Other than this the patient has been doing quite HAILLE, PARDI ([id number]) well since I last saw her and that was in May 2020. 09/11/2019 on evaluation today patient appeared to be doing very well with regard to  her plantar foot ulcer on the left. In fact this appears to be almost completely healed which is awesome. That is after just 1 week of intervention. With that being said there is no signs of active infection at this time. 09/18/2019 on evaluation today patient actually appears to be doing excellent in fact she is completely healed based on what I am seeing at this point. Fortunately there is no signs of active infection at this time and overall patient is very pleased to hear that this area has healed so quickly. Readmission: 05/13/2021 upon evaluation today this patient presents for reevaluation here in the clinic. This is a wound that actually we previously took care of. She had 1 on the right ankle and the left the left turned out to be be harder due to to heal but nonetheless is doing great at this point as the right that has reopened and it was noted first just several weeks ago with a scab over it and came off in just the past few days. Fortunately there does not appear to be any obvious evidence of significant active infection at this time which is great news. No fevers, chills, nausea, vomiting, or diarrhea. 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 There does not appear to be any signs of this interfering in any way with her wound. She does have swelling we previously had compression socks for her ordered but again it does not look like she wears these on a regular basis by any means. 05/26/2021 upon evaluation today patient appears to be doing well with regard to her wound which is actually showing signs of excellent improvement. There does not appear to be any signs of active infection which is great news and overall very pleased with where things stand today. No fevers, chills, nausea, vomiting, or diarrhea. 06/02/2021 upon evaluation today patient's wound actually showing signs of excellent improvement. Fortunately there does not appear to  be any signs of active  infection which is great news. I think the patient is making good progress with regard to her wounds in general. 06/09/2021 upon evaluation today patient appears to be doing excellent in regard to her wounds currently. Fortunately there does not appear to be any signs of active infection which is great news. No fevers, chills, nausea, vomiting, or diarrhea. Overall extremely pleased with where things stand today. I think the patient is making excellent progress. 06/16/2021 upon evaluation today patient appears to be doing well in regard to her wound. This is going require little bit of debridement today and that was discussed with the patient. Otherwise she seems to be doing quite well and I am actually very pleased with where things stand at this point. No fevers, chills, nausea, vomiting, or diarrhea. 06/23/2021 upon evaluation today patient appears to be doing well with regard to her wounds. She has been tolerating the dressing changes without complication. Fortunately there does not appear to be any evidence of infection and she has not had air in her home which she actually lives at an assisted living that got fixed this morning. With that being said because of that her wrap has been extremely hot and bothersome for her over the past week. 06/30/2021 upon evaluation today patient is actually making excellent progress in regard to her ankle ulcer. She has been tolerating the dressing changes without complication and overall extremely pleased with where things stand there does not appear to be any evidence of active infection which is great news. No fevers, chills, nausea, vomiting, or diarrhea. 07/07/21 upon evaluation today patients and culture on the right actually appears to be doing quite well. There does not appear to be any signs of infection and overall very pleased with where things stand today. No fevers, chills, nausea, or vomiting noted at this time. 07/14/2021  unfortunately the patient today has some evidence of deep tissue injury and pressure getting to the ankle region. Again I am not exactly sure what is going on here but this is very similar to issues that we have had in the past. I explained to the patient that she needs to be very mindful of exactly what is happening I think sleeping in bed is probably the main issue here although there could be other culprits I am not sure what else would potentially lead to this kind of a problem for her. 07/21/2021 upon evaluation today patient's wound actually showing signs of improvement compared to last week. Fortunately there does not appear to be any signs of active infection which is great news and overall very pleased with where things stand in that regard. With that being said I do believe that she is continuing to show signs of overall of getting better although I think this is still basically about what we were 2 weeks ago due to the worsening and now improvement. 07/28/2021 upon evaluation today patient appears to be doing well with regard to her wound. She does have some slough buildup on the surface of the wound which I would have to manage today. Fortunately there is no sign of active infection at this time. No fevers, chills, nausea, vomiting, or diarrhea. 08/04/2021 upon evaluation today patient appears to be doing about the same in regard to her wound. To be perfectly honest I am beginning to be a little bit concerned about the overall appearance of the wound bed. I do think possibly taking a sample right around the margin of the wound could be beneficial for her  as far as identifying anything such as an inflammatory process or to be honest even a skin tag cancer type process that may be of concern here. Fortunately there does not appear to be any evidence of active infection at this time which is great news she is not having any pain also great news. 08/11/2021 upon evaluation today patient appears to  be doing well with regard to her wound. The good news is I did review her biopsy results and it showed some inflammatory mixed findings but nothing that appeared to be malignant which is great news. Overall this is more of a chronic venous stasis type issue which again is more what we have been treating. Nonetheless I just wanted to make sure before going forward that there was not anything more untoward going on at this point. 08/18/2021 upon evaluation today patient appears to be doing well with regard to her ankle ulcer. Fortunately there does not appear to be any signs of active infection at this time which is great overall wound is dramatically improved compared to last week. Since last week I have actually placed her on doxycycline and subsequently this is a good option as far as the findings are concerned at this point. 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 the doxycycline is doing that. There does not appear to be any evidence of active infection systemically which is great news. 08/25/2021 upon evaluation today patient appears to be doing well with regard to her wound. I feel like we are finally get back on track as far as healing is concerned I am much happier with the overall appearance today. I do think that she is tolerating the dressing changes without complication which is great news. We have been using Hydrofera Blue which I think is a good option. The good news is she is also doing great in [place], [place]. ([id number]) regard to her compression sock on the left which is a zipper compression that seems to be doing a great job keeping her edema under good control. 09/01/2021 upon evaluation today patient appears to be doing well with regard to her wound. Infection seems to be under much better control which is great news and very pleased in that regard.  Fortunately there does not appear to be any signs of infection currently. 09/08/2021 upon evaluation today patient actually appears to be making good progress in regard to her wound. She has been tolerating the dressing changes without complication. Fortunately there does not appear to be any evidence of active infection at this time which is great news as well. No fevers, chills, nausea, vomiting, or diarrhea. 09/22/2021 upon evaluation today patient appears to be doing well with regard to her wound although is very slow to heal. We have not looked into Apligraf yet I think that is something that we should see about doing. 09/29/2021 upon evaluation today patient's wound is actually showing signs of doing about the same. I am not seeing any evidence of worsening but also no significant evidence of improvement. We did gain approval for the organogenesis products all except for Apligraf as covered by her insurance. With that being said I do think that we can go ahead and proceed with the NuShield if the patient and her family in agreement of the plan I discussed that with him today she does have a 20% coinsurance  which we also discussed. 10/06/2021 upon evaluation today patient appears to unfortunately be doing a little bit worse she appears to be infected based on what I am seeing. Fortunately there does not appear to be any signs of active infection at this time which is great news. Unfortunately it does appear to be some evidence of around the wound edge indicated by way of erythema and warmth as well as redness 10/13/2021 upon evaluation today patient actually appears to be doing excellent in regard to her ankle ulcer compared to what it was. 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 I think were fine to  go ahead and apply the NuShield today. 10/20/2021 upon evaluation today patient appears to be doing excellent in fact the NuShield seems to have done all some for her thus far. I am actually very pleased with where we stand and overall I think that she is making great progress. There is no evidence of active infection at this time. 11/23; patient presents for follow-up. She has no issues or complaints today. She denies signs of infection. She reports stability in her wound healing. 11/03/2021 upon evaluation today patient appears to be doing well with regard to her wounds. She has been tolerating the dressing changes without complication. Fortunately there is no signs of active infection at this time. 11/10/2021 upon evaluation today patient appears to be doing well with regard to her right ankle which is actually showing signs of improvement with the NuShield I am very pleased. Subsequently in regards to the left ankle this appears to be doing okay with no evidence of issue here either. With that being said she has a significant contusion on the left leg from having fallen 2 days ago. She tells me that she was using her walker going to the closet and then when she got to the closet turned around to get something out at which point she fell according to the story. Nonetheless she does have a hematoma just below her knee on the anterior portion of her shin. My hope is that this will not open until wound although we do need I think Some compression over it also think that we need to have her use an ice as well to help with the swelling and prevent this from getting worse. The last thing she needs is a wound opened up here. 11/17/2021 upon evaluation patient's right ankle actually showing signs of improvement based on what I see currently there is a lot of new skin growth coming in which is great news. Nonetheless I do feel like that the patient is showing improvement as well in regard to her left leg  with the use of the Tubigrip it swollen but not as bruised as what I would have expected after what I was seeing last week. Nonetheless I do think doubling up on the Tubigrip would probably be beneficial to try to keep some of the edema under better control here. 11/24/2021 upon evaluation today patient appears to be doing excellent in regard to her wound. This is actually measuring significantly smaller which is great news. Fortunately I do not see any signs of active infection locally nor systemically at this point. Overall I am very pleased with how the NuShield is doing. 12/30; wound bed looks healthy however not much change  in overall wound volume. We applied Nushield again today in the standard fashion 12/08/2021 upon evaluation today patient's wound actually showing signs of good improvement and I am actually very pleased with where we stand today as well. I do not see any evidence of active infection locally nor systemically which is great news. Unfortunately she has been having some issues with stomach upset and diarrhea today. 12/15/2021 upon evaluation today patient appears to be doing excellent in regard to her wound. There is a little bit of dry skin raised up around the edges of the wound but fortunately nothing too significant at this point. Fortunately I do not see any evidence of active infection either which is great news. No fevers, chills, nausea, vomiting, or diarrhea. 12/29/2021 upon evaluation today patient appears to be doing well with regard to her wound. In general I feel like she is getting very close to complete resolution. I think that she is making good progress here as well. Overall I do not see any signs of infection and very little of this appears to actually be open. 01/12/2022 upon evaluation today patient appears to be doing a little bit worse in regard to her wound. It appears that the collagen actually trapped fluid underneath the collagen which got hard and  subsequently caused the fluid to back up. This patient has made the wound appear to be larger than what it was previous. With that being said I do not see any signs of obvious infection is not warm to touch and not significantly erythematous either which is good news. Nonetheless I do think that we will need to continue to keep an eye on this. Probably not go put on antibiotic this week but I will be too far from doing so she is actually on a Z-Pak right now I do not want to really double up on antibiotics. 01/26/2022 upon evaluation today patient's wound is showing signs of improvement although this is slow to turn back around. Fortunately there does not appear to be any evidence of active infection locally or systemically which is great news and overall I am extremely pleased with where we stand today. No fevers, chills, nausea, vomiting, or diarrhea. 02/02/2022 upon evaluation today patient appears to be doing well with regard to her wound. She has been tolerating the dressing changes without complication. Fortunately there does not appear to be any signs of active infection locally nor systemically at this time which is great news. No fevers, chills, nausea, vomiting, or diarrhea. FAVIOLA, KLARE ([id number]) 3/9; patient presents for follow-up. She has no issues or complaints today. She denies signs of infection. 02/16/2022 upon evaluation today patient appears to be doing well with regard to her wound. She has been tolerating the dressing changes without complication. Fortunately I do not see any evidence of active infection locally nor systemically at this point which is great news. Overall I think that the patient is making excellent progress in general. The wound is measuring smaller and though there does appear to be some need for sharp debridement today this is minimal compared to what we have noted previous. 02/23/2022 upon evaluation today patient appears to be doing well with regard to  her wound she is definitely showing signs of improvement which is great news. 03/02/2022 upon evaluation today patient appears to be doing about the same in regard to her wound. Unfortunately were not seeing significant improvement. With that being said is also not significantly worse which is great news. No fevers, chills,  nausea, vomiting, or diarrhea. 03-09-2022 upon evaluation today patient appears to be doing well with regard to her wound. She has been tolerating the dressing changes without complication. Fortunately there does not appear to be any signs of active infection locally or systemically which is great news. 03-16-2022 upon evaluation today patient appears to be doing well with regard to her wound this is measuring smaller and looking much better. I am actually very pleased with where we stand and I think that the patient is making great progress. I do not see any evidence of active infection locally or systemically which is great news. No fevers, chills, nausea, vomiting, or diarrhea. 03-23-2022 upon evaluation today patient appears to be doing better in regard to her wounds. In fact the wound area actually is showing signs of excellent improvement and actually very pleased with where we stand today. I do not see any evidence of active infection locally nor systemically which is great news. 4/27; comes in today with again thick callus around the wound circumference which I removed with a curette. Some debris on the surface. Overall I do not think quite as good as it was last week by description. Using endoform 04-06-2022 upon evaluation today patient appears to be doing pretty well in regard to her wound. Fortunately I do not see any evidence of active infection locally or systemically which is great news and overall I am pleased in that regard. I do feel like this is measuring smaller postdebridement compared to where we were previous. Overall she is headed in the right direction. She does  have an area that is threatening to open and looks a little irritated on the left ankle we did measure this just for the discolored area for now its not draining too much but we want to monitor and make sure nothing worsens here. 04-13-2022 upon evaluation today patient appears to be doing better in regard to her wound although this is still very slow to heal. We have reapplied for a skin substitute but have not heard anything back as of yet. Fortunately I do not see any evidence of active infection locally or systemically at this time which is great news. 04-20-2022 upon evaluation today patient's wound is actually showing signs of significant improvement which is great news. We did get approval for skin substitutes which I think is an option here for [place] but at the same time I am not even certain that this is going to be necessary especially considering how well things seem to be doing at this point. If she continues healing as we see her right now but I think were probably going to be able to avoid any need for additional skin substitute. Patient and her daughter are both in agreement with that plan. With that being said she did have a fall she has several areas of contusion fortunately nothing that is can require intervention at this point but nonetheless she did also fracture her rib which is extremely uncomfortable obviously. 04-27-2022 upon evaluation today patient appears to be doing well currently in regard to her wound on the right lateral ankle. Overall I feel like she is actually doing significantly better even compared to last week and very pleased in this regard. I think we are on the right track here. 05-04-2022 upon evaluation today patient appears to be doing well with regard to her wound. She is going require some sharp debridement today to clear away some of the necrotic debris. Fortunately I was able to do  this quite easily without significant issue or breakdown here. There is a very  small area of opening centrally but really this is pretty close to getting close. 05-11-2022 upon evaluation today patient's wound is actually showing signs of excellent improvement. I do not see any evidence of infection locally or systemically at this time. Overall I think she is very close to complete resolution with just a little bit of debridement to clear away some of the dry crusty area around the edges of the wound currently. 05-18-2022 upon evaluation patient's wound bed actually appears to be likely healed although I cannot be 100% sure I am extremely pleased however with where things stand today. I do not see any evidence of infection locally or systemically which is great news and overall I think you are on the right track here. 05-25-2022 upon evaluation today patient appears to be doing well with regard to her leg ulcer. Fortunately there does not appear to be any signs of active infection locally or systemically at this time which is great news. No fevers, chills, nausea, vomiting, or diarrhea. With that being said she does appear to be healed in regard to her right lateral ankle which is great news. In regard to her leg unfortunately during the removal of the compression wrap there was a small skin tear that occurred with the scissors. Subsequently the patient does have a minimal amount of bleeding this is going require a couple Steri-Strips in order to seal this up. I do think it should likely heal quite well. Obviously this was unintentional. Nonetheless we do have to definitely monitor make sure that this completely heals up as well I am hopeful that we will be the case by next week to be honest. Objective Constitutional Pendley, [name] ([id number]) Well-nourished and well-hydrated in no acute distress. Vitals Time Taken: 10:23 AM, Height: 62 in, Weight: 150 lbs, BMI: 27.4, Temperature: 97.7 F, Pulse: 68 bpm, Respiratory Rate: 16 breaths/min, Blood Pressure: 121/80  mmHg. Respiratory normal breathing without difficulty. Psychiatric this patient is able to make decisions and demonstrates good insight into disease process. Alert and Oriented x 3. pleasant and cooperative. General Notes: Upon inspection patient's wound bed actually showed signs of good granulation and epithelization at this point. Fortunately there does not appear to be any evidence of infection or anything open in regard to the ankle and in regard to the skin tear I was able to put a couple small Steri-Strips over this and I think it will actually do quite well for her as far as reattachment and healing is concerned. This seems to be doing excellent at this point. Integumentary (Hair, Skin) Wound #7 status is Healed - Epithelialized. Original cause of wound was Gradually Appeared. The date acquired was: 05/12/2021. The wound has been in treatment 53 weeks. The wound is located on the Right,Lateral Malleolus. The wound measures 0cm length x 0cm width x 0cm depth; 0cm^2 area and 0cm^3 volume. There is a none present amount of drainage noted. There is large (67-100%) granulation within the wound bed. There is no necrotic tissue within the wound bed. Wound #9 status is Open. Original cause of wound was Skin Tear/Laceration. The date acquired was: 05/25/2022. The wound is located on the Right,Lateral Lower Leg. The wound measures 1cm length x 0.9cm width x 0.1cm depth; 0.707cm^2 area and 0.071cm^3 volume. There is Fat Layer (Subcutaneous Tissue) exposed. There is a none present amount of drainage noted. There is large (67-100%) red granulation within the wound bed.  Assessment Active Problems ICD-10 Pressure ulcer of right ankle, stage 3 Lymphedema, not elsewhere classified Laceration without foreign body, right lower leg, initial encounter Venous insufficiency (chronic) (peripheral) Presence of cardiac pacemaker Long term (current) use of anticoagulants Plan 1. I am going to suggest that we go  ahead and continue with the wound care measures as before and the patient is in agreement with plan this includes the use of the ABD pads to cover I do have Steri-Strips over the new skin tear which is on the upper portion of the leg again this was caused by getting the compression wrap off of her today. With that being said I do think that she is really doing well otherwise and I am very pleased in that regard. 2. In regard to her original wound this still appears to be healed which is great and overall I think we will get a continue with the ABD pad cover with a 3 layer compression wrap on which is doing an awesome job. We will see patient back for reevaluation in 1 week here in the clinic. If anything worsens or changes patient will contact our office for additional recommendations. Electronic Signature(s) Signed: 05/25/2022 11:06:24 AM By: Worthy Keeler PA-C Previous Signature: 05/25/2022 11:03:56 AM Version By: Worthy Keeler PA-C Entered By: Worthy Keeler on 05/25/2022 11:06:24 Ambrosio, [name] ([id number]) -------------------------------------------------------------------------------- SuperBill Details Patient Name: [name] Date of Service: 05/25/2022 Medical Record Number: [id number] Patient Account Number: [id number] Date of Birth/Sex: Mar 22, 1923 (88 y.o. F) Treating RN: Alycia Rossetti Primary Care Provider: Adrian Prows Other Clinician: Referring Provider: Adrian Prows Treating Provider/Extender: Skipper Cliche in Treatment: 89 Diagnosis Coding ICD-10 Codes Code Description [phone] Pressure ulcer of right ankle, stage 3 I89.0 Lymphedema, not elsewhere classified S81.811A Laceration without foreign body, right lower leg, initial encounter I87.2 Venous insufficiency (chronic) (peripheral) Z95.0 Presence of cardiac pacemaker Z79.01 Long term (current) use of anticoagulants Physician Procedures CPT4 Code: [id number] Description: 99213 - WC PHYS LEVEL 3  - EST PT Modifier: Quantity: 1 CPT4 Code: Description: ICD-10 Diagnosis Description 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, stage 3 I89.0 Lymphedema, not elsewhere classified S81.811A Laceration without foreign body, right lower leg, initial encounter I87.2 Venous insufficiency (chronic)  (peripheral) Modifier: Quantity: Electronic Signature(s) Signed: 05/25/2022 11:06:40 AM By: Worthy Keeler PA-C Entered By: Worthy Keeler on 05/25/2022 11:06:40

## 2022-06-01 ENCOUNTER — Encounter: Payer: Medicare Other | Admitting: Physician Assistant

## 2022-06-01 ENCOUNTER — Ambulatory Visit (INDEPENDENT_AMBULATORY_CARE_PROVIDER_SITE_OTHER): Payer: Medicare Other | Admitting: Podiatry

## 2022-06-01 ENCOUNTER — Encounter: Payer: Self-pay | Admitting: Podiatry

## 2022-06-01 DIAGNOSIS — I739 Peripheral vascular disease, unspecified: Secondary | ICD-10-CM

## 2022-06-01 DIAGNOSIS — M79676 Pain in unspecified toe(s): Secondary | ICD-10-CM | POA: Diagnosis not present

## 2022-06-01 DIAGNOSIS — B351 Tinea unguium: Secondary | ICD-10-CM

## 2022-06-01 DIAGNOSIS — L89513 Pressure ulcer of right ankle, stage 3: Secondary | ICD-10-CM | POA: Diagnosis not present

## 2022-06-01 NOTE — Progress Notes (Signed)
This patient returns to my office for at risk foot care.  This patient requires this care by a professional since this patient will be at risk due to having PVD and coagulation defect.  Patient is taking eliquis..  This patient is unable to cut nails herself since the patient cannot reach her nails.These nails are painful walking and wearing shoes. Patient presents to the office with her daughter. This patient presents for at risk foot care today.  General Appearance  Alert, conversant and in no acute stress.  Vascular  Dorsalis pedis and posterior tibial  pulses are weakly  palpable  bilaterally.  Capillary return is within normal limits  bilaterally. Cold feet.  Bilaterally. Absent hair noted.  Neurologic  Senn-Weinstein monofilament wire test within normal limits  bilaterally. Muscle power within normal limits bilaterally.  Nails Thick disfigured discolored nails with subungual debris  from hallux to fifth toes bilaterally. No evidence of bacterial infection or drainage bilaterally.  Orthopedic  No limitations of motion  feet .  No crepitus or effusions noted.  No bony pathology.  Medial deviation of 3,4 and 5 right foot.  No pain or swelling noted.  Skin  normotropic skin with no porokeratosis noted bilaterally.  No signs of infections or ulcers noted.     Onychomycosis  Pain in right toes  Pain in left toes  Consent was obtained for treatment procedures.   Mechanical debridement of nails 1-5  bilaterally performed with a nail nipper.  Filed with dremel without incident.    Return office visit    3 months                Told patient to return for periodic foot care and evaluation due to potential at risk complications.   Gardiner Barefoot DPM

## 2022-06-01 NOTE — Progress Notes (Addendum)
KATALYN, MATIN ([id number]) Visit Report for 06/01/2022 Chief Complaint Document Details Patient Name: [name], [name]. Date of Service: 06/01/2022 2:45 PM Medical Record Number: [id number] Patient Account Number: [id number] Date of Birth/Sex: 1923/04/01 (88 y.o. F) Treating RN: Carlene Coria Primary Care Provider: Adrian Prows Other Clinician: Referring Provider: Adrian Prows Treating Provider/Extender: Skipper Cliche in Treatment: 61 Information Obtained from: Patient Chief Complaint Right foot ulcer Electronic Signature(s) Signed: 06/01/2022 3:19:16 PM By: Worthy Keeler PA-C Entered By: Worthy Keeler on 06/01/2022 15:19:15 Bilton, [name] ([id number]) -------------------------------------------------------------------------------- HPI Details Patient Name: [name] Date of Service: 06/01/2022 2:45 PM Medical Record Number: [id number] Patient Account Number: [id number] Date of Birth/Sex: 07-21-1923 (88 y.o. 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 Dr. Amalia Hailey the podiatrist saw her on 07/20/2017 and sent her to the wound center for possible hyperbaric oxygen therapy. past medical history of peripheral vascular disease, varicose veins, status post appendectomy, basal cell carcinoma excision from the left leg, cholecystectomy, pacemaker placement, right lower extremity angiography done by Dr. dew in March 2017 with placement of a stent. there is also note of a successful ablation of the right small saphenous vein done which was reviewed by ultrasound on 10/24/2016. the  patient had a right small saphenous vein ablation done on 10/20/2016. The patient has never been a smoker. She has been seen by Dr. Corene Cornea dew the vascular surgeon who most recently saw her on 06/15/2017 for evaluation of ongoing problems with right leg swelling. She had a lower extremity arterial duplex examination done(02/13/17) which showed patent distal right superficial femoral artery stent and above-the-knee popliteal stent without evidence of restenosis. The ABI was more than 1.3 on the right and more than 1.3 on the left. This was consistent with noncompressible arteries due to medial calcification. The right great toe pressure and PPG waveforms are within normal limits and the left great toe pressure and PPG waveforms are decreased. he recommended she continue to wear her compression stockings and continue with elevation. She is scheduled to have a noninvasive arterial study in the near future 08/16/2017 -- had a lower extremity arterial duplex examination done which showed patent distal right superficial femoral artery stent and above-the-knee popliteal stent without evidence of restenosis. The ABI was more than 1.3 on the right and more than 1.3 on the left. This was consistent with noncompressible arteries due to medial calcification. The right great toe pressure and PPG waveforms are within normal limits and the left great toe pressure and PPG waveforms are decreased. the x-ray of the right ankle has not yet been done 08/24/2017 -- had a right ankle x-ray -- IMPRESSION:1. No fracture, bone lesion or evidence of osteomyelitis. 2. Lateral soft tissue swelling with a soft tissue ulcer. she has not yet seen the vascular surgeon for review 08/31/17 on evaluation today patient's wound appears to be showing signs of improvement. She still with her appointment with vascular in order to review her results of her vascular study and then determine if any intervention would be recommended at that  time. No fevers, chills, nausea, or vomiting noted at this time. She has been tolerating the dressing changes without complication. 09/28/17 on evaluation today patient's  wound appears to show signs of good improvement in regard to the granulation tissue which is surfacing. There is still a layer of slough covering the wound and the posterior portion is still significantly deeper than the anterior nonetheless there has been some good sign of things moving towards the better. She is going to go back to Dr. dew for reevaluation to ensure her blood flow is still appropriate. That will be before her next evaluation with [place] next week. No fevers, chills, nausea, or vomiting noted at this time. Patient does have some discomfort rated to be a 3-4/10 depending on activity specifically cleansing the wound makes it worse. 10/05/2017 -- the patient was seen by Dr. Lucky Cowboy last week and noninvasive studies showed a normal right ABI with brisk triphasic waveforms consistent with no arterial insufficiency including normal digital pressures. The duplex showed a patent distal right SFA stent and the proximal SFA was also normal. He was pleased with her test and thought she should have enough of perfusion for normal wound healing. He would see her back in 6 months time. 12/21/17 on evaluation today patient appears to be doing fairly well in regard to her right lateral ankle wound. Unfortunately the main issue that she is expansion at this point is that she is having some issues with what appears to be some cellulitis in the right anterior shin. She has also been noting a little bit of uncomfortable feeling especially last night and her ankle area. I'm afraid that she made the developing a little bit of an infection. With that being said I think it is in the early stages. 12/28/17 on evaluation today patient's ankle appears to be doing excellent. She's making good progress at this point the cellulitis seems to have improved  after last week's evaluation. Overall she is having no significant discomfort which is excellent news. She does have an appointment with Dr. dew on March 29, 2018 for reevaluation in regard to the stent he placed. She seems to have excellent blood flow in the right lower extremity. 01/19/12 on evaluation today patient's wound appears to be doing very well. In fact she does not appear to require debridement at this point, there's no evidence of infection, and overall from the standpoint of the wound she seems to be doing very well. With that being said I believe that it may be time to switch to different dressing away from the [facility] Dressing she tells me she does have a lot going on her friend actually passed away yesterday and she's also having a lot of issues with her husband this obviously is weighing heavy on her as far as your thoughts and concerns today. 01/25/18 on evaluation today patient appears to be doing fairly well in regard to her right lateral malleolus. She has been tolerating the dressing changes without complication. Overall I feel like this is definitely showing signs of improvement as far as how the overall appearance of the wound is there's also evidence of epithelium start to migrate over the granulation tissue. In general I think that she is progressing nicely as far as the wound is concerned. The only concern she really has is whether or not we can switch to every other week visits in order to avoid having as many appointments as her daughters have a difficult time getting her to her appointments as well as the patient's husband to his he is not doing very well at this point. 02/22/18 on evaluation today patient's right lateral malleolus ulcer  appears to be doing great. She has been tolerating the dressing changes without complication. Overall you making excellent progress at this time. Patient is having no significant discomfort. [name], [name] ([id number]) 03/15/18  on evaluation today patient appears to be doing much more poorly in regard to her right lateral ankle ulcer at this point. Unfortunately since have last seen her her husband has passed just a few days ago is obviously weighed heavily on her her daughter also had surgery well she is with her today as usual. There does not appear to be any evidence of infection she does seem to have significant contusion/deep tissue injury to the right lateral malleolus which was not noted previous when I saw her last. It's hard to tell of exactly when this injury occurred although during the time she was spending the night in the hospital this may have been most likely. 03/22/18 on evaluation today patient appears to actually be doing very well in regard to her ulcer. She did unfortunately have a setback which was noted last week however the good news is we seem to be getting back on track and in fact the wound in the core did still have some necrotic tissue which will be addressed at this point today but in general I'm seeing signs that things are on the up and up. She is glad to hear this obviously she's been somewhat concerned that due to the how her wound digressed more recently. 03/29/18 on evaluation today patient appears to be doing fairly well in regard to her right lower extremity lateral malleolus ulcer. She unfortunately does have a new area of pressure injury over the inferior portion where the wound has opened up a little bit larger secondary to the pressure she seems to be getting. She does tell me sometimes when she sleeps at night that it actually hurts and does seem to be pushing on the area little bit more unfortunately. There does not appear to be any evidence of infection which is good news. She has been tolerating the dressing changes without complication. She also did have some bruising in the left second and third toes due to the fact that she may have bump this or injured it although she has  neuropathy so she does not feel she did move recently that may have been where this came from. Nonetheless there does not appear to be any evidence of infection at this time. 04/12/18 on evaluation today patient's wound on the right lateral ankle actually appears to be doing a little bit better with a lot of necrotic docking tissue centrally loosening up in clearing away. However she does have the beginnings of a deep tissue injury on the left lateral malleolus likely due to the fact we've been trying offload the right as much as we have. I think she may benefit from an assistive soft device to help with offloading and it looks like they're looking at one of the doughnut conditions that wraps around the lower leg to offload which I think will definitely do a good job. With that being said I think we definitely need to address this issue on the left before it becomes a wound. Patient is not having significant pain. 04/19/18 on evaluation today patient appears to be doing excellent in regard to the progress she's made with her right lateral ankle ulcer. The left ankle region which did show evidence of a deep tissue injury seems to be resolving there's little fluid noted underneath and a blister there's  nothing open at this point in time overall I feel like this is progressing nicely which is good news. She does not seem to be having significant discomfort at this point which is also good news. 04/25/18-She is here in follow up evaluation for bilateral lateral malleolar ulcers. The right lateral malleolus ulcer with pale subcutaneous tissue exposure, central area of ulcer with tendon/periosteum exposed. The left lateral malleolus ulcer now with central area of nonviable tissue, otherwise deep tissue injury. She is wearing compression wraps to the left lower extremity, she will place the right lower extremity compression wraps on when she gets home. She will be out of town over the weekend and return next  week and follow-up appointment. She completed her doxycycline this morning 05/03/18 on evaluation today patient appears to be doing very well in regard to her right lateral ankle ulcer in general. At least she's showing some signs of improvement in this regard. Unfortunately she has some additional injury to the left lateral malleolus region which appears to be new likely even over the past several days. Again this determination is based on the overall appearance. With that being said the patient is obviously frustrated about this currently. 05/10/18-She is here in follow-up evaluation for bilateral lateral malleolar ulcers. She states she has purchased offloading shoes/boots and they will arrive tomorrow. She was asked to bring them in the office at next week's appointment so her provider is aware of product being utilized. She continues to sleep on right or left side, she has been encouraged to sleep on her back. The right lateral malleolus ulcer is precariously close to peri-osteum; will order xray. The left lateral malleolus ulcer is improved. Will switch back to santyl; she will follow up next week. 05/17/18 on evaluation today patient actually appears to be doing very well in regard to her malleolus her ulcers compared to last time I saw them. She does not seem to have as much in the way of contusion at this point which is great news. With that being said she does continue to have discomfort and I do believe that she is still continuing to benefit from the offloading/pressure reducing boots that were recommended. I think this is the key to trying to get this to heal up completely. 05/24/18 on evaluation today patient actually appears to be doing worse at this point in time unfortunately compared to her last week's evaluation. She is having really no increased pain which is good news unfortunately she does have more maceration in your theme and noted surrounding the right lateral ankle the left  lateral ankle is not really is erythematous I do not see signs of the overt cellulitis on that side. Unfortunately the wounds do not seem to have shown any signs of improvement since the last evaluation. She also has significant swelling especially on the right compared to previous some of this may be due to infection however also think that she may be served better while she has these wounds by compression wrapping versus continuing to use the Juxta-Lite for the time being. Especially with the amount of drainage that she is experiencing at this point. No fevers, chills, nausea, or vomiting noted at this time. 05/31/18 on evaluation today patient appears to actually be doing better in regard to her right lateral lower extremity ulcer specifically on the malleolus region. She has been tolerating the antibiotic without complication. With that being said she still continues to have issues but a little bit of redness although nothing like  she what she was experiencing previous. She still continues to pressure to her ankle area she did get the problem on offloading boots unfortunately she will not wear them she states there too uncomfortable and she can't get in and out of the bed. Nonetheless at this point her wounds seem to be continually getting worse which is not what we want I'm getting somewhat concerned about her progress and how things are going to proceed if we do not intervene in some way shape or form. I therefore had a very lengthy conversation today about offloading yet again and even made a specific suggestion for switching her to a memory foam mattress and even gave the information for a specific one that they could look at getting if it was something that they were interested in considering. She does not want to be considered for a hospital bed air mattress although honestly insurance would not cover it that she does not have any wounds on her trunk. 06/14/18 on evaluation today both wounds  over the bilateral lateral malleolus her ulcers appear to be doing better there's no evidence of pressure injury at this point. She did get the foam mattress for her bed and this does seem to have been extremely beneficial for her in my pinion. Her daughter states that she is having difficulty getting out of bed because of how soft it is. The patient also relates this to be. Nonetheless I do feel like she's actually doing better. Unfortunately right after and around the time she was getting the mattress she also sustained a fall when she got up to go pick up the phone and ended up injuring her right elbow she has [address] sut[address] place. We are not caring for this currently although home health is going to be taking the sutures out shortly. Nonetheless this may be something that we need to evaluate going forward. It depends on how well it has or has not healed in the end. She also recently saw an orthopedic specialist for an injection in the right shoulder just before her fall unfortunately the fall seems to have worsened her pain. 06/21/18 on evaluation today patient appears to be doing about the same in regard to her lateral malleolus ulcers. Both appear to be just a little bit deeper but again we are clinging away the necrotic and dead tissue which I think is why this is progressing towards a deeper realm as opposed [name], [name]. ([id number]) to improving from my measurement standpoint in that regard. Nonetheless she has been tolerating the dressing changes she absolutely hates the memory foam mattress topper that was obtained for her nonetheless I do believe this is still doing excellent as far as taking care of excess pressure in regard to the lateral malleolus regions. She in fact has no pressure injury that I see whereas in weeks past it was week by week I was constantly seeing new pressure injuries. Overall I think it has been very beneficial for her. 07/03/18; patient arrives in my clinic  today. She has deep punched out areas over her bilateral lateral malleoli. The area on the right has some more depth. We spent a lot of time today talking about pressure relief for these areas. This started when her daughter asked for a prescription for a memory foam mattress. I have never written a prescription for a mattress and I don't think insurances would pay for that on an ordinary bed. In any case he came up that she has foam  boots that she refuses to wear. I would suggest going to these before any other offloading issues when she is in bed. They say she is meticulous about offloading this the rest of the day 07/10/18- She is seen in follow-up evaluation for bilateral, lateral malleolus ulcers. There is no improvement in the ulcers. She has purchased and is sleeping on a memory foam mattress/overlay, she has been using the offloading boots nightly over the past week. She has a follow up appointment with vascular medicine at the end of October, in my opinion this follow up should be expedited given her deterioration and suboptimal TBI results. We will order plain film xray of the left ankle as deeper structures are palpable; would consider having MRI, regardless of xray report(s). The ulcers will be treated with iodoflex/iodosorb, she is unable to safely change the dressings daily with santyl. 07/19/18 on evaluation today patient appears to be doing in general visually well in regard to her bilateral lateral malleolus ulcers. She has been tolerating the dressing changes without complication which is good news. With that being said we did have an x-ray performed on 07/12/18 which revealed a slight loosen see in the lateral portion of the distal left fibula which may represent artifact but underline lytic destruction or osteomyelitis could not be excluded. MRI was recommended. With that being said we can see about getting the patient scheduled for an MRI to further evaluate this area. In fact we have  that scheduled currently for August 20 19,019. 07/26/18 on evaluation today patient's wound on the right lateral ankle actually appears to be doing fairly well at this point in my pinion. She has made some good progress currently. With that being said unfortunately in regard to the left lateral ankle ulcer this seems to be a little bit more problematic at this time. In fact as I further evaluated the situation she actually had bone exposed which is the first time that's been the case in the bone appear to be necrotic. Currently I did review patient's note from Dr. Bunnie Domino office with [facility]. He stated that ABI was 1.26 on the right and 0.95 on the left with good waveforms. Her perfusion is stable not reduced from previous studies and her digital waveforms were pretty good particularly on the right. His conclusion upon review of the note was that there was not much she could do to improve her perfusion and he felt she was adequate for wound healing. His suggestion was that she continued to see [place] and consider a synthetic skin graft if there was no underlying infection. He plans to see her back in six months or as needed. 08/01/18 on evaluation today patient appears to be doing better in regard to her right lateral ankle ulcer. Her left lateral ankle ulcer is about the same she still has bone involvement in evidence of necrosis. There does not appear to be evidence of infection at this time On the right lateral lower extremity. I have started her on the Augmentin she picked this up and started this yesterday. This is to get her through until she sees infectious disease which is scheduled for 08/12/18. 08/06/18 on evaluation today patient appears to be doing rather well considering my discussion with patient's daughter at the end of last week. The area which was marked where she had erythema seems to be improved and this is good news. With that being said overall the patient seems  to be making good improvement when it  comes to the overall appearance of the right lateral ankle ulcer although this has been slow she at least is coming around in this regard. Unfortunately in regard to the left lateral ankle ulcer this is osteomyelitis based on the pathology report as well is bone culture. Nonetheless we are still waiting CT scan. Unfortunately the MRI we originally ordered cannot be performed as the patient is a pacemaker which I had overlooked. Nonetheless we are working on the CT scan approval and scheduling as of now. She did go to the hospital over the weekend and was placed on IV Cefzo for a couple of days. Fortunately this seems to have improved the erythema quite significantly which is good news. There does not appear to be any evidence of worsening infection at this time. She did have some bleeding after the last debridement therefore I did not perform any sharp debridement in regard to left lateral ankle at this point. Patient has been approved for a snap vac for the right lateral ankle. 08/14/18; the patient with wounds over her bilateral lateral malleoli. The area on the right actually looks quite good. Been using a snap back on this area. Healthy granulation and appears to be filling in. Unfortunately the area on the left is really problematic. 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 Also noted to have cellulitis. She saw Dr. Novella Olive of infectious disease today and was put on linezolid. We are able to verify this with her pharmacy. She is completed the Augmentin that she was already on. We've been using Iodoflex to this area 08/23/18 on evaluation today patient's wounds both actually appear to be doing better compared to my prior evaluations. Fortunately she showing signs of good improvement in regard to the overall wound status especially where were using the snap vac on the right. In  regard to left lateral malleolus the wound bed actually appears to be much cleaner than previously noted. I do not feel any phone directly probed during evaluation today and though there is tendon noted this does not appear to be necrotic it's actually fairly good as far as the overall appearance of the tendon is concerned. In general the wound bed actually appears to be doing significantly better than it was previous. Patient is currently in the care of [name] and I did review that note today. He actually has her on two weeks of linezolid and then following the patient will be on 1-2 months of Keflex. That is the plan currently. She has been on antibiotics therapy as prescribed by myself initially starting on July 30, 2018 and has been on that continuously up to this point. 08/30/18 on evaluation today patient actually appears to be doing much better in regard to her right lateral malleolus ulcer. She has been tolerating the dressing changes specifically the snap vac without complication although she did have some issues with the seal currently. Apparently there was some trouble with getting it to maintain over the past week past Sunday. Nonetheless overall the wound appears better in regard to the right lateral malleolus region. In regard to left lateral malleolus this actually show some signs of additional granulation although there still tendon noted in the base of the wound this appears to be healthy not necrotic in any way whatsoever. We are considering potentially using a snap vac for the left lateral malleolus as well the product wrap from [employer], [place], was present in  the clinic today we're going to see this patient I did have her come in with me after obtaining consent from the patient and her daughter in order to look at the wound and see if there's any recommendation one way or another as to whether or not they felt the snapback could be beneficial for the left lateral malleolus region.  But the conclusion was that it might be but that this is definitely a little bit deeper wound than what traditionally would be utilized for a snap vac. 09/06/18 on evaluation today patient actually appears to be doing excellent in my pinion in regard to both ankle ulcers. She has been tolerating the dressing changes without complication which is great news. Specifically we have been using the snap vac. In regard to the right ankle I'm not even sure that this is going to be necessary for today and following as the wound has filled in quite nicely. In regard to the left ankle I do believe SEASON, ASTACIO ([id number]) that we're seeing excellent epithelialization from the edge as well as granulation in the central portion the tendon is still exposed but there's no evidence of necrotic bone and in general I feel like the patient has made excellent progress even compared to last week with just one week of the snap vac. 09/11/18; this is a patient who has wounds on her bilateral lateral malleoli. Initially both of these were deep stage IV wounds in the setting of chronic arterial insufficiency. She has been revascularized. As I understand think she been using snap vacs to both of these wounds however the area on the right became more superficial and currently she is only using it on the left. Using silver collagen on the right and silver collagen under the back on the left I believe 09/19/18 on evaluation today patient actually appears to be doing very well in regard to her lateral malleolus or ulcers bilaterally. She has been tolerating the dressing changes without complication. Fortunately there does not appear to be any evidence of infection at this time. Overall I feel like she is improving in an excellent manner and I'm very pleased with the fact that everything seems to be turning towards the better for her. This has obviously been a long road. 09/27/18 on evaluation today patient actually appears  to be doing very well in regard to her bilateral lateral malleolus ulcers. She has been tolerating the dressing changes without complication. Fortunately there does not appear to be any evidence of infection at this time which is also great news. No fevers, chills, nausea, or vomiting noted at this time. Overall I feel like she is doing excellent with the snap vac on the left malleolus. She had 40 mL of fluid collection over the past week. 10/04/18 on evaluation today patient actually appears to be doing well in regard to her bilateral lateral malleolus ulcers. She continues to tolerate the dressing changes without complication. One issue that I see is the snap vac on the left lateral malleolus which appears to have sealed off some fluid underlying this area and has not really allowed it to heal to the degree that I would like to see. For that reason I did suggest at this point we may want to pack a small piece of packing strip into this region to allow it to more effectively wick out fluid. 10/11/18 in general the patient today does not feel that she has been doing very well. She's been a little bit lethargic and  subsequently is having bodyaches as well according to what she tells me today. With that being said overall she has been concerned with the fact that something may be worsening although to be honest her wounds really have not been appearing poorly. She does have a new ulcer on her left heel unfortunately. This may be pressure related. Nonetheless it seems to me to have potentially started at least as a blister I do not see any evidence of deep tissue injury. In regard to the left ankle the snap vac still seems to be causing the ceiling off of the deeper part of the wound which is in turn trapping fluid. I'm not extremely pleased with the overall appearance as far as progress from last week to this week therefore I'm gonna discontinue the snap vac at this point. 10/18/18 patient unfortunately  this point has not been feeling well for the past several days. She was seen by Grayland Ormond her primary care provider who is a Librarian, academic at [facility]. Subsequently she states that she's been very weak and generally feeling malaise. No fevers, chills, nausea, or vomiting noted at this time. With that being said bloodwork was performed at the PCP office on the 11th of this month which showed a white blood cell count of 10.7. This was repeated today and shows a white blood cell count of 12.4. This does show signs of worsening. Coupled with the fact that she is feeling worse and that her left ankle wound is not really showing signs of improvement I feel like this is an indication that the osteomyelitis is likely exacerbating not improving. Overall I think we may also want to check her C-reactive protein and sedimentation rate. Actually did call [facility] office this afternoon while the patient was in the office here with me. Subsequently based on the findings we discussed treatment possibilities and I think that it is appropriate for [place] to go ahead and initiate treatment with doxycycline which I'm going to do. Subsequently he did agree to see about adding a CRP and sedimentation rate to her orders. If that has not already been drawn to where they can run it they will contact the patient she can come back to have that check. They are in agreement with plan as far as the patient and her daughter are concerned. Nonetheless also think we need to get in touch with Dr. Henreitta Leber office to see about getting the patient scheduled with him as soon as possible. 11/08/18 on evaluation today patient presents for follow-up concerning her bilateral foot and ankle ulcers. I did do an extensive review of her chart in epic today. Subsequently she was seen by [name] he did initiate Cefepime IV antibiotic therapy. Subsequently she had some issues with her PICC line this had to be removed because it  was coiled and then replaced. Fortunately that was now settled. Unfortunately she has continued have issues with her left heel as well as the issues that she is experiencing with her bilateral lateral malleolus regions. I do believe however both areas seem to be doing a little bit better on evaluation today which is good news. No fevers, chills, nausea, or vomiting noted at this time. She actually has an angiogram schedule with Dr. dew on this coming Monday, November 11, 2018. Subsequently the patient states that she is feeling much better especially than what she was roughly 2 weeks ago. She actually had to cancel an appointment because she was feeling so poorly. No fevers,  chills, nausea, or vomiting noted at this time. 11/15/18 on evaluation today patient actually is status post having had her angiogram with Dr. dew Monday, four days ago. 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 He had to go and work on several areas of the vasculature fortunately he was able to obtain no more than a 30% residual stenosis throughout post procedure. I reviewed this note today. I think this will definitely help with healing at this time. Fortunately there does not appear to be any signs of infection and I do feel like ratio already has a better appearance to it. 11/22/18 upon evaluation today patient actually appears to be doing very well in regard to her wounds in general. The right lateral malleolus looks excellent the heel looks better in the left lateral malleolus also appears to be doing a little better. With that being said the right second toe actually appears to be open and training we been watching this is been dry and stable but now is open. 12/03/2018 Seen today for follow-up and management of multiple bilateral lower extremity wounds. New pressure injury of the great toe which is closed at this time. Wound of the right distal second toe appears larger today with deep undermining  and a pocket of fluid present within the undermining region. Left and right malleolus is wounds are stable today with no signs and symptoms of infection.Denies any needs or concerns during exam today. 12/13/18 on evaluation today patient appears to be doing somewhat better in regard to her left heel ulcer. She also seems to be completely healed in regard to the right lateral malleolus ulcer. The left malleolus ulcer is smaller what unfortunately the wounds which are new over the first and second toes of the right foot are what are most concerning at this point especially the second. Both areas did require sharp debridement today. 12/20/18 on evaluation today patient's wound actually appears to be doing better in regard to left lateral ankle and her right lateral ankle continues to remain healed. The hill ulcer on the left is improved. She does have improvement noted as well in regard to both toe ulcers. Overall I'm very pleased in this regard. No fevers, chills, nausea, or vomiting noted at this time. 12/23/18 on evaluation today patient is seen after she had her toenails trimmed at the podiatrist office due to issues with her right great toe. There was what appeared to be dark eschar on the surface of the wound which had her in the podiatrist concerned. Nonetheless as I remember that during the last office visit I had utilize silver nitrate of this area I was much less concerned about the situation. Subsequently I was able to clean off much of this tissue without any complication today. This does not appear to show any signs of infection and actually look somewhat better Somera, [name] ([id number]) compared to last time post debridement. Her second toe on the right foot actually had callous over and there did appear still be some fluid underneath this that would require debridement today. 12/27/18 on evaluation today patient actually appears to be showing signs of improvement at all locations. Even  the left lateral ankle although this is not quite as great as the other sites. Fortunately there does not appear to be any signs of infection at this time and both of her toes on the right foot seem to be showing signs of improvement which is good news and very pleased  in this regard. 01/03/19 on evaluation today patient appears to be doing better for the most part in regard to her wounds in particular. There does not appear to be any evidence of infection at this time which is good news. Fortunately there is no sign of really worsening anywhere except for the right great toe which she does have what appears to be a bruise/deep tissue injury which is very superficial and already resolving. I'm not sure where this came from I questioned her extensively and she does not recall what may have happened with this. Other than that the patient seems to be doing well even the left lateral ankle ulcer looks good and is getting smaller. 01/10/19 on evaluation today patient appears to be doing well in regard to her left heel wound and both of her toe wounds. Overall I feel like there is definitely improvement here and I'm happy in that regard. With that being said unfortunately she is having issues with the left lateral malleolus ulcer which unfortunately still has a lot of depth to it. This is gonna be a very difficult wound for [place] to be able to truly get to heal. I may want to consider some type of skin substitute to see if this would be of benefit for her. I'll discuss this with her more the next visit most likely. This was something I thought about more at the end of the visit when I was Artie out of the room and the patient had been discharged. 01/17/19 on evaluation today patient appears to be doing very well in regard to her wounds in general. She's been making excellent progress at this time. Fortunately there's no sign of infection at this time either. No fevers, chills, nausea, or vomiting noted at this  time. The biggest issue is still her left lateral malleolus where it appears to be doing well and is getting smaller but still shows a small corner where this is deeper and goes down into what appears to be the joint space. Nonetheless this is taking much longer to heal although it still looks better in smaller than previous evaluations. 01/24/19 on evaluation today patient's wounds actually appear to be doing rather well in general overall. She did require some sharp debridement in regard to the right great toe but everything else appears to be doing excellent no debridement was even necessary. No fevers, chills, nausea, or vomiting noted at this time. 01/31/19 on evaluation today patient actually appears to be doing much better in regard to her left foot wound on the heel as well as the ankle. The right great toe appears to be a little bit worse today this had callous over and trapped a lot of fluid underneath. Fortunately there's no signs of infection at any site which is great news. 02/07/19 on evaluation today patient actually appears to be doing decently well in regard to all of her ulcers at this point. No sharp debridement was required she is a little bit of hyper granulation in regard to the left lateral ankle as well as the left heel but the hill itself is almost completely healed which is excellent news. Overall been very pleased in this regard. 02/14/19 on evaluation today patient actually appears to be doing very well in regard to her ulcers on the right first toe, left lateral malleolus, and left heel. In fact the heel is almost completely healed at this point. The patient does not show any signs of infection which is good news. Overall very  pleased with how things have progressed. 04/18/19 Telehealth Evaluation During the COVID-19 National Emergency: Verbal Consent: Obtained from patient Allergies: reviewed and the active list is current. Medication changes: patient has no current  medication changes. COVID-19 Screening: 1. Have you traveled internationally or on a cruise ship in the last 14 dayso No 2. Have you had contact with someone with or under investigation for COVID-19o No 3. Have you had a fever, cough, sore throat, or experiencing shortness of breatho No on evaluation today actually did have a visit with this patient through a telehealth encounter with her home health nurse. Subsequently it was noted that the patient actually appears to be doing okay in regard to her wounds both the right great toe as well as the left lateral malleolus have shown signs of improvement although this in your theme around the left lateral malleolus there eschar coverings for both locations. The question is whether or not they are actually close and whether or not home health needs to discharge the patient or not. Nonetheless my concern is this point obviously is that without actually seeing her and being able to evaluate this directly I cannot ensure that she is completely healed which is the question that I'm being asked. 04/22/19 on evaluation today patient presents for her first evaluation since last time I saw her which was actually February 14, 2019. I did do a telehealth visit last week in which point it was questionable whether or not she may be healed and had to bring her in today for confirmation. With that being said she does seem to be doing quite well at this point which is good news. There does not appear to be any drainage in the deed I believe her wounds may be healed. Readmission: 09/04/2019 on evaluation today patient appears to be doing unfortunately somewhat more poorly in regard to her left foot ulcer secondary to a wound that began on 08/21/2019 at least when she first noticed this. Fortunately she has not had any evidence of active infection at this time. Systemically. I also do not necessarily see any evidence of infection at the blister/wound site on the first  metatarsal head plantar aspect. This almost appears to be something that may have just rubbed inappropriately causing this to breakdown. They did not want a wait too long to come in to be seen as again she had significant issues in the past with wounds that took quite a while to heal in fact it was close to 2 years. Nonetheless this does not appear to be quite that bad but again we do need to remove some of the necrotic tissue from the surface of the wound to tell exactly the extent. She does not appear to have any significant arterial disease at this point and again her last ABIs and TBI's are recorded above in the alert section her left ABI was 1.27 with a TBI of 0.72 to the right ABI 1.08 with a TBI of 0.39. Other than this the patient has been doing quite well since I last saw her and that was in May 2020. 09/11/2019 on evaluation today patient appeared to be doing very well with regard to her plantar foot ulcer on the left. In fact this appears to be almost completely healed which is awesome. That is after just 1 week of intervention. With that being said there is no signs of active infection at this time. [name], [name] ([id number]) 09/18/2019 on evaluation today patient actually appears to be doing  excellent in fact she is completely healed based on what I am seeing at this point. Fortunately there is no signs of active infection at this time and overall patient is very pleased to hear that this area has healed so quickly. Readmission: 05/13/2021 upon evaluation today this patient presents for reevaluation here in the clinic. This is a wound that actually we previously took care of. She had 1 on the right ankle and the left the left turned out to be be harder due to to heal but nonetheless is doing great at this point as the right that has reopened and it was noted first just several weeks ago with a scab over it and came off in just the past few days. Fortunately there does not appear to  be any obvious evidence of significant active infection at this time which is great news. No fevers, chills, nausea, vomiting, or diarrhea. 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 There does not appear to be any signs of this interfering in any way with her wound. She does have swelling we previously had compression socks for her ordered but again it does not look like she wears these on a regular basis by any means. 05/26/2021 upon evaluation today patient appears to be doing well with regard to her wound which is actually showing signs of excellent improvement. There does not appear to be any signs of active infection which is great news and overall very pleased with where things stand today. No fevers, chills, nausea, vomiting, or diarrhea. 06/02/2021 upon evaluation today patient's wound actually showing signs of excellent improvement. Fortunately there does not appear to be any signs of active infection which is great news. I think the patient is making good progress with regard to her wounds in general. 06/09/2021 upon evaluation today patient appears to be doing excellent in regard to her wounds currently. Fortunately there does not appear to be any signs of active infection which is great news. No fevers, chills, nausea, vomiting, or diarrhea. Overall extremely pleased with where things stand today. I think the patient is making excellent progress. 06/16/2021 upon evaluation today patient appears to be doing well in regard to her wound. This is going require little bit of debridement today and that was discussed with the patient. Otherwise she seems to be doing quite well and I am actually very pleased with where things stand at this point. No fevers, chills, nausea, vomiting, or diarrhea. 06/23/2021 upon evaluation today patient appears to be doing well with regard to her wounds. She has been tolerating the dressing changes without complication.  Fortunately there does not appear to be any evidence of infection and she has not had air in her home which she actually lives at an assisted living that got fixed this morning. With that being said because of that her wrap has been extremely hot and bothersome for her over the past week. 06/30/2021 upon evaluation today patient is actually making excellent progress in regard to her ankle ulcer. She has been tolerating the dressing changes without complication and overall extremely pleased with where things stand there does not appear to be any evidence of active infection which is great news. No fevers, chills, nausea, vomiting, or diarrhea. 07/07/21 upon evaluation today patients and culture on the right actually appears to be doing quite well. There does not appear to be any signs of infection and overall very pleased with where things stand today. No fevers,  chills, nausea, or vomiting noted at this time. 07/14/2021 unfortunately the patient today has some evidence of deep tissue injury and pressure getting to the ankle region. Again I am not exactly sure what is going on here but this is very similar to issues that we have had in the past. I explained to the patient that she needs to be very mindful of exactly what is happening I think sleeping in bed is probably the main issue here although there could be other culprits I am not sure what else would potentially lead to this kind of a problem for her. 07/21/2021 upon evaluation today patient's wound actually showing signs of improvement compared to last week. Fortunately there does not appear to be any signs of active infection which is great news and overall very pleased with where things stand in that regard. With that being said I do believe that she is continuing to show signs of overall of getting better although I think this is still basically about what we were 2 weeks ago due to the worsening and now improvement. 07/28/2021 upon evaluation  today patient appears to be doing well with regard to her wound. She does have some slough buildup on the surface of the wound which I would have to manage today. Fortunately there is no sign of active infection at this time. No fevers, chills, nausea, vomiting, or diarrhea. 08/04/2021 upon evaluation today patient appears to be doing about the same in regard to her wound. To be perfectly honest I am beginning to be a little bit concerned about the overall appearance of the wound bed. I do think possibly taking a sample right around the margin of the wound could be beneficial for her as far as identifying anything such as an inflammatory process or to be honest even a skin tag cancer type process that may be of concern here. Fortunately there does not appear to be any evidence of active infection at this time which is great news she is not having any pain also great news. 08/11/2021 upon evaluation today patient appears to be doing well with regard to her wound. The good news is I did review her biopsy results and it showed some inflammatory mixed findings but nothing that appeared to be malignant which is great news. Overall this is more of a chronic venous stasis type issue which again is more what we have been treating. Nonetheless I just wanted to make sure before going forward that there was not anything more untoward going on at this point. 08/18/2021 upon evaluation today patient appears to be doing well with regard to her ankle ulcer. Fortunately there does not appear to be any signs of active infection at this time which is great overall wound is dramatically improved compared to last week. Since last week I have actually placed her on doxycycline and subsequently this is a good option as far as the findings are concerned at this point. I do believe that the positive result of MRSA is definitely something that needed to be addressed and the good news is The doxycycline is doing a good job of doing  this. the doxycycline is doing that. There does not appear to be any evidence of active infection systemically which is great news. 08/25/2021 upon evaluation today patient appears to be doing well with regard to her wound. I feel like we are finally get back on track as far as healing is concerned I am much happier with the overall appearance today. I  do think that she is tolerating the dressing changes without complication which is great news. We have been using Hydrofera Blue which I think is a good option. The good news is she is also doing great in regard to her compression sock on the left which is a zipper compression that seems to be doing a great job keeping her edema under good control. 09/01/2021 upon evaluation today patient appears to be doing well with regard to her wound. Infection seems to be under much better control which is great news and very pleased in that regard. Fortunately there does not appear to be any signs of infection currently. [name], [name] ([id number]) 09/08/2021 upon evaluation today patient actually appears to be making good progress in regard to her wound. She has been tolerating the dressing changes without complication. Fortunately there does not appear to be any evidence of active infection at this time which is great news as well. No fevers, chills, nausea, vomiting, or diarrhea. 09/22/2021 upon evaluation today patient appears to be doing well with regard to her wound although is very slow to heal. We have not looked into Apligraf yet I think that is something that we should see about doing. 09/29/2021 upon evaluation today patient's wound is actually showing signs of doing about the same. I am not seeing any evidence of worsening but also no significant evidence of improvement. We did gain approval for the organogenesis products all except for Apligraf as covered by her insurance. With that being said I do think that we can go ahead and proceed with the  NuShield if the patient and her family in agreement of the plan I discussed that with him today she does have a 20% coinsurance which we also discussed. 10/06/2021 upon evaluation today patient appears to unfortunately be doing a little bit worse she appears to be infected based on what I am seeing. Fortunately there does not appear to be any signs of active infection at this time which is great news. Unfortunately it does appear to be some evidence of around the wound edge indicated by way of erythema and warmth as well as redness 10/13/2021 upon evaluation today patient actually appears to be doing excellent in regard to her ankle ulcer compared to what it was. 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 I think were fine to go ahead and apply the NuShield today. 10/20/2021 upon evaluation today patient appears to be doing excellent in fact the NuShield seems to have done all some for her thus far. I am actually very pleased with where we stand and overall I think that she is making great progress. There is no evidence of active infection at this time. 11/23; patient presents for follow-up. She has no issues or complaints today. She denies signs of infection. She reports stability in her wound healing. 11/03/2021 upon evaluation today patient appears to be doing well with regard to her wounds. She has been tolerating the dressing changes without complication. Fortunately there is no signs of active infection at this time. 11/10/2021 upon evaluation today patient appears to be doing well with regard to her right ankle which is actually showing signs of improvement with the NuShield I am very pleased. Subsequently in regards to the left ankle this appears to be doing okay with no  evidence of issue here  either. With that being said she has a significant contusion on the left leg from having fallen 2 days ago. She tells me that she was using her walker going to the closet and then when she got to the closet turned around to get something out at which point she fell according to the story. Nonetheless she does have a hematoma just below her knee on the anterior portion of her shin. My hope is that this will not open until wound although we do need I think Some compression over it also think that we need to have her use an ice as well to help with the swelling and prevent this from getting worse. The last thing she needs is a wound opened up here. 11/17/2021 upon evaluation patient's right ankle actually showing signs of improvement based on what I see currently there is a lot of new skin growth coming in which is great news. Nonetheless I do feel like that the patient is showing improvement as well in regard to her left leg with the use of the Tubigrip it swollen but not as bruised as what I would have expected after what I was seeing last week. Nonetheless I do think doubling up on the Tubigrip would probably be beneficial to try to keep some of the edema under better control here. 11/24/2021 upon evaluation today patient appears to be doing excellent in regard to her wound. This is actually measuring significantly smaller which is great news. Fortunately I do not see any signs of active infection locally nor systemically at this point. Overall I am very pleased with how the NuShield is doing. 12/30; wound bed looks healthy however not much change in overall wound volume. We applied Nushield again today in the standard fashion 12/08/2021 upon evaluation today patient's wound actually showing signs of good improvement and I am actually very pleased with where we stand today as well. I do not see any evidence of active infection locally nor systemically which is great news. Unfortunately she has been  having some issues with stomach upset and diarrhea today. 12/15/2021 upon evaluation today patient appears to be doing excellent in regard to her wound. There is a little bit of dry skin raised up around the edges of the wound but fortunately nothing too significant at this point. Fortunately I do not see any evidence of active infection either which is great news. No fevers, chills, nausea, vomiting, or diarrhea. 12/29/2021 upon evaluation today patient appears to be doing well with regard to her wound. In general I feel like she is getting very close to complete resolution. I think that she is making good progress here as well. Overall I do not see any signs of infection and very little of this appears to actually be open. 01/12/2022 upon evaluation today patient appears to be doing a little bit worse in regard to her wound. It appears that the collagen actually trapped fluid underneath the collagen which got hard and subsequently caused the fluid to back up. This patient has made the wound appear to be larger than what it was previous. With that being said I do not see any signs of obvious infection is not warm to touch and not significantly erythematous either which is good news. Nonetheless I do think that we will need to continue to keep an eye on this. Probably not go put on antibiotic this week but I will be too far from doing so she is actually on  a Z-Pak right now I do not want to really double up on antibiotics. 01/26/2022 upon evaluation today patient's wound is showing signs of improvement although this is slow to turn back around. Fortunately there does not appear to be any evidence of active infection locally or systemically which is great news and overall I am extremely pleased with where we stand today. No fevers, chills, nausea, vomiting, or diarrhea. 02/02/2022 upon evaluation today patient appears to be doing well with regard to her wound. She has been tolerating the dressing changes  without complication. Fortunately there does not appear to be any signs of active infection locally nor systemically at this time which is great news. No fevers, chills, nausea, vomiting, or diarrhea. 3/9; patient presents for follow-up. She has no issues or complaints today. She denies signs of infection. 02/16/2022 upon evaluation today patient appears to be doing well with regard to her wound. She has been tolerating the dressing changes without complication. Fortunately I do not see any evidence of active infection locally nor systemically at this point which is great news. Overall I [name], [name] ([id number]) think that the patient is making excellent progress in general. The wound is measuring smaller and though there does appear to be some need for sharp debridement today this is minimal compared to what we have noted previous. 02/23/2022 upon evaluation today patient appears to be doing well with regard to her wound she is definitely showing signs of improvement which is great news. 03/02/2022 upon evaluation today patient appears to be doing about the same in regard to her wound. Unfortunately were not seeing significant improvement. With that being said is also not significantly worse which is great news. No fevers, chills, nausea, vomiting, or diarrhea. 03-09-2022 upon evaluation today patient appears to be doing well with regard to her wound. She has been tolerating the dressing changes without complication. Fortunately there does not appear to be any signs of active infection locally or systemically which is great news. 03-16-2022 upon evaluation today patient appears to be doing well with regard to her wound this is measuring smaller and looking much better. I am actually very pleased with where we stand and I think that the patient is making great progress. I do not see any evidence of active infection locally or systemically which is great news. No fevers, chills, nausea, vomiting, or  diarrhea. 03-23-2022 upon evaluation today patient appears to be doing better in regard to her wounds. In fact the wound area actually is showing signs of excellent improvement and actually very pleased with where we stand today. I do not see any evidence of active infection locally nor systemically which is great news. 4/27; comes in today with again thick callus around the wound circumference which I removed with a curette. Some debris on the surface. Overall I do not think quite as good as it was last week by description. Using endoform 04-06-2022 upon evaluation today patient appears to be doing pretty well in regard to her wound. Fortunately I do not see any evidence of active infection locally or systemically which is great news and overall I am pleased in that regard. I do feel like this is measuring smaller postdebridement compared to where we were previous. Overall she is headed in the right direction. She does have an area that is threatening to open and looks a little irritated on the left ankle we did measure this just for the discolored area for now its not draining too much but we  want to monitor and make sure nothing worsens here. 04-13-2022 upon evaluation today patient appears to be doing better in regard to her wound although this is still very slow to heal. We have reapplied for a skin substitute but have not heard anything back as of yet. Fortunately I do not see any evidence of active infection locally or systemically at this time which is great news. 04-20-2022 upon evaluation today patient's wound is actually showing signs of significant improvement which is great news. We did get approval for skin substitutes which I think is an option here for [place] but at the same time I am not even certain that this is going to be necessary especially considering how well things seem to be doing at this point. If she continues healing as we see her right now but I think were probably going to  be able to avoid any need for additional skin substitute. Patient and her daughter are both in agreement with that plan. With that being said she did have a fall she has several areas of contusion fortunately nothing that is can require intervention at this point but nonetheless she did also fracture her rib which is extremely uncomfortable obviously. 04-27-2022 upon evaluation today patient appears to be doing well currently in regard to her wound on the right lateral ankle. Overall I feel like she is actually doing significantly better even compared to last week and very pleased in this regard. I think we are on the right track here. 05-04-2022 upon evaluation today patient appears to be doing well with regard to her wound. She is going require some sharp debridement today to clear away some of the necrotic debris. Fortunately I was able to do this quite easily without significant issue or breakdown here. There is a very small area of opening centrally but really this is pretty close to getting close. 05-11-2022 upon evaluation today patient's wound is actually showing signs of excellent improvement. I do not see any evidence of infection locally or systemically at this time. Overall I think she is very close to complete resolution with just a little bit of debridement to clear away some of the dry crusty area around the edges of the wound currently. 05-18-2022 upon evaluation patient's wound bed actually appears to be likely healed although I cannot be 100% sure I am extremely pleased however with where things stand today. I do not see any evidence of infection locally or systemically which is great news and overall I think you are on the right track here. 05-25-2022 upon evaluation today patient appears to be doing well with regard to her leg ulcer. Fortunately there does not appear to be any signs of active infection locally or systemically at this time which is great news. No fevers, chills, nausea,  vomiting, or diarrhea. With that being said she does appear to be healed in regard to her right lateral ankle which is great news. In regard to her leg unfortunately during the removal of the compression wrap there was a small skin tear that occurred with the scissors. Subsequently the patient does have a minimal amount of bleeding this is going require a couple Steri-Strips in order to seal this up. I do think it should likely heal quite well. Obviously this was unintentional. Nonetheless we do have to definitely monitor make sure that this completely heals up as well I am hopeful that we will be the case by next week to be honest. 06-01-2022 upon evaluation today patient appears  to be doing excellent in regard to her wounds. Both areas are completely healed which is great news. Fortunately I do not see any signs of active infection locally or systemically at this time which is great and overall I am extremely pleased in that regard. No fevers, chills, nausea, vomiting, or diarrhea. Electronic Signature(s) Signed: 06/02/2022 7:53:58 AM By: Worthy Keeler PA-C Entered By: Worthy Keeler on 06/02/2022 07:53:57 Pettigrew, [name] ([id number]) -------------------------------------------------------------------------------- Physical Exam Details Patient Name: [name] Date of Service: 06/01/2022 2:45 PM Medical Record Number: [id number] Patient Account Number: [id number] Date of Birth/Sex: [birth year] (88 y.o. F) Treating RN: Carlene Coria Primary Care Provider: Adrian Prows Other Clinician: Referring Provider: Adrian Prows Treating Provider/Extender: Skipper Cliche in Treatment: 16 Constitutional Well-nourished and well-hydrated in no acute distress. Respiratory normal breathing without difficulty. Psychiatric this patient is able to make decisions and demonstrates good insight into disease process. Alert and Oriented x 3. pleasant and cooperative. Notes Upon inspection  patient appears to be completely healed which is great news in regard to both wounds I see no evidence of active infection locally or systemically at this time. Electronic Signature(s) Signed: 06/02/2022 7:54:08 AM By: Worthy Keeler PA-C Entered By: Worthy Keeler on 06/02/2022 07:54:07 Fuller, [name] ([id number]) -------------------------------------------------------------------------------- Physician Orders Details Patient Name: [name] Date of Service: 06/01/2022 2:45 PM Medical Record Number: [id number] Patient Account Number: [id number] Date of Birth/Sex: 02-Jul-1923 (88 y.o. 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 Put garments on first thing when you wake up and remove them before bed. Wound Treatment Electronic Signature(s) Signed: 06/02/2022 10:10:35 AM By: Carlene Coria RN Signed: 06/02/2022 4:59:17 PM By: Worthy Keeler PA-C Entered By: Carlene Coria on 06/01/2022 15:31:23 Aul, [name] ([id number]) -------------------------------------------------------------------------------- Problem List Details Patient Name: [name] Date of Service: 06/01/2022 2:45 PM Medical Record Number: [id number] Patient Account Number: [id number] Date of Birth/Sex: 12-19-1922 (88 y.o. 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, stage 3 05/13/2021 No Yes I89.0 Lymphedema, not elsewhere classified 05/13/2021 No Yes S81.811A Laceration without foreign body, right lower leg, initial encounter 05/25/2022 No Yes I87.2 Venous insufficiency (chronic) (peripheral) 05/13/2021 No Yes Z95.0 Presence of cardiac pacemaker 05/13/2021 No Yes Z79.01 Long term (current) use of anticoagulants 05/13/2021 No Yes Inactive Problems ICD-10 Code Description Active Date Inactive Date S80.12XA Contusion of left lower leg, initial encounter 11/10/2021 11/10/2021 Resolved Problems Electronic Signature(s) Signed: 06/01/2022 3:19:02 PM By: Worthy Keeler PA-C Entered By: Worthy Keeler on 06/01/2022 15:19:02 Rosevear, [name] ([id number]) -------------------------------------------------------------------------------- Progress Note Details Patient Name: [name] Date of Service: 06/01/2022 2:45 PM Medical Record Number: [id number] Patient Account Number: [id number] Date of Birth/Sex: Aug 06, 1923 (88 y.o. 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 Dr. Amalia Hailey the podiatrist saw her on 07/20/2017 and sent her to the wound center for possible hyperbaric oxygen therapy. past medical history of peripheral vascular disease, varicose  veins, status post appendectomy, basal cell carcinoma excision from the left leg, cholecystectomy, pacemaker placement, right lower extremity angiography done by Dr. dew in March 2017 with placement of a stent. there is also note of a successful ablation of the right small saphenous vein done which was reviewed by ultrasound on 10/24/2016. the patient had a right small saphenous vein ablation done on 10/20/2016. The patient has never been a smoker. She has been seen by Dr. Corene Cornea dew the vascular surgeon who most recently saw her on 06/15/2017 for evaluation of ongoing problems with right leg swelling. She had a lower extremity arterial duplex examination done(02/13/17) which showed patent distal right superficial femoral artery stent and above-the-knee popliteal stent without evidence of restenosis. The ABI was more than 1.3 on the right and more than 1.3 on the left. This was consistent with noncompressible arteries due to medial calcification. The right great toe pressure and PPG waveforms are within normal limits and the left great toe pressure and PPG waveforms are decreased. he recommended she continue to wear her compression stockings and continue with elevation. She is scheduled to have a noninvasive arterial study in the near future 08/16/2017 -- had a lower extremity arterial duplex examination done which showed patent distal right superficial femoral artery stent and above-the-knee popliteal stent without evidence of restenosis. The ABI was more than 1.3 on the right and more than 1.3 on the left. This was consistent with noncompressible arteries due to medial calcification. The right great toe pressure and PPG waveforms are within normal limits and the left great toe pressure and PPG waveforms are decreased. the x-ray of the right ankle has not yet been done 08/24/2017 -- had a right ankle x-ray -- IMPRESSION:1. No fracture, bone lesion or evidence of osteomyelitis. 2. Lateral soft tissue  swelling with a soft tissue ulcer. she has not yet seen the vascular surgeon for review 08/31/17 on evaluation today patient's wound appears to be showing signs of improvement. She still with her appointment with vascular in order to review her results of her vascular study and then determine if any intervention would be recommended at that time. No fevers, chills, nausea, or vomiting noted at this time. She has been tolerating the dressing changes without complication. 09/28/17 on evaluation today patient's wound appears to show signs of good improvement in regard to the granulation tissue which is surfacing. There is still a layer of slough covering the wound and the posterior portion is still significantly deeper than the anterior nonetheless there has been some good sign of things moving towards the better. She is going to go back to Dr. dew for reevaluation to ensure her blood flow is still appropriate. That will be before her next evaluation with [place] next week. No fevers, chills, nausea, or vomiting noted at this time. Patient does have some discomfort rated to be a 3-4/10 depending on activity specifically cleansing the wound makes it worse. 10/05/2017 -- the patient was seen by Dr. Lucky Cowboy last week and noninvasive studies showed a normal right ABI  with brisk triphasic waveforms consistent with no arterial insufficiency including normal digital pressures. The duplex showed a patent distal right SFA stent and the proximal SFA was also normal. He was pleased with her test and thought she should have enough of perfusion for normal wound healing. He would see her back in 6 months time. 12/21/17 on evaluation today patient appears to be doing fairly well in regard to her right lateral ankle wound. Unfortunately the main issue that she is expansion at this point is that she is having some issues with what appears to be some cellulitis in the right anterior shin. She has also been noting a little bit of  uncomfortable feeling especially last night and her ankle area. I'm afraid that she made the developing a little bit of an infection. With that being said I think it is in the early stages. 12/28/17 on evaluation today patient's ankle appears to be doing excellent. She's making good progress at this point the cellulitis seems to have improved after last week's evaluation. Overall she is having no significant discomfort which is excellent news. She does have an appointment with Dr. dew on March 29, 2018 for reevaluation in regard to the stent he placed. She seems to have excellent blood flow in the right lower extremity. 01/19/12 on evaluation today patient's wound appears to be doing very well. In fact she does not appear to require debridement at this point, there's no evidence of infection, and overall from the standpoint of the wound she seems to be doing very well. With that being said I believe that it may be time to switch to different dressing away from the [facility] Dressing she tells me she does have a lot going on her friend actually passed away yesterday and she's also having a lot of issues with her husband this obviously is weighing heavy on her as far as your thoughts and concerns today. 01/25/18 on evaluation today patient appears to be doing fairly well in regard to her right lateral malleolus. She has been tolerating the dressing changes without complication. Overall I feel like this is definitely showing signs of improvement as far as how the overall appearance of the wound is there's also evidence of epithelium start to migrate over the granulation tissue. In general I think that she is progressing nicely as far as the wound is concerned. The only concern she really has is whether or not we can switch to every other week visits in order to avoid having as many ROSINA, CRESSLER ([id number]) appointments as her daughters have a difficult time getting her to her appointments as  well as the patient's husband to his he is not doing very well at this point. 02/22/18 on evaluation today patient's right lateral malleolus ulcer appears to be doing great. She has been tolerating the dressing changes without complication. Overall you making excellent progress at this time. Patient is having no significant discomfort. 03/15/18 on evaluation today patient appears to be doing much more poorly in regard to her right lateral ankle ulcer at this point. Unfortunately since have last seen her her husband has passed just a few days ago is obviously weighed heavily on her her daughter also had surgery well she is with her today as usual. There does not appear to be any evidence of infection she does seem to have significant contusion/deep tissue injury to the right lateral malleolus which was not noted previous when I saw her last. It's hard to tell of exactly  when this injury occurred although during the time she was spending the night in the hospital this may have been most likely. 03/22/18 on evaluation today patient appears to actually be doing very well in regard to her ulcer. She did unfortunately have a setback which was noted last week however the good news is we seem to be getting back on track and in fact the wound in the core did still have some necrotic tissue which will be addressed at this point today but in general I'm seeing signs that things are on the up and up. She is glad to hear this obviously she's been somewhat concerned that due to the how her wound digressed more recently. 03/29/18 on evaluation today patient appears to be doing fairly well in regard to her right lower extremity lateral malleolus ulcer. She unfortunately does have a new area of pressure injury over the inferior portion where the wound has opened up a little bit larger secondary to the pressure she seems to be getting. She does tell me sometimes when she sleeps at night that it actually hurts and does seem  to be pushing on the area little bit more unfortunately. There does not appear to be any evidence of infection which is good news. She has been tolerating the dressing changes without complication. She also did have some bruising in the left second and third toes due to the fact that she may have bump this or injured it although she has neuropathy so she does not feel she did move recently that may have been where this came from. Nonetheless there does not appear to be any evidence of infection at this time. 04/12/18 on evaluation today patient's wound on the right lateral ankle actually appears to be doing a little bit better with a lot of necrotic docking tissue centrally loosening up in clearing away. However she does have the beginnings of a deep tissue injury on the left lateral malleolus likely due to the fact we've been trying offload the right as much as we have. I think she may benefit from an assistive soft device to help with offloading and it looks like they're looking at one of the doughnut conditions that wraps around the lower leg to offload which I think will definitely do a good job. With that being said I think we definitely need to address this issue on the left before it becomes a wound. Patient is not having significant pain. 04/19/18 on evaluation today patient appears to be doing excellent in regard to the progress she's made with her right lateral ankle ulcer. The left ankle region which did show evidence of a deep tissue injury seems to be resolving there's little fluid noted underneath and a blister there's nothing open at this point in time overall I feel like this is progressing nicely which is good news. She does not seem to be having significant discomfort at this point which is also good news. 04/25/18-She is here in follow up evaluation for bilateral lateral malleolar ulcers. The right lateral malleolus ulcer with pale subcutaneous tissue exposure, central area of ulcer  with tendon/periosteum exposed. The left lateral malleolus ulcer now with central area of nonviable tissue, otherwise deep tissue injury. She is wearing compression wraps to the left lower extremity, she will place the right lower extremity compression wraps on when she gets home. She will be out of town over the weekend and return next week and follow-up appointment. She completed her doxycycline this morning 05/03/18 on  evaluation today patient appears to be doing very well in regard to her right lateral ankle ulcer in general. At least she's showing some signs of improvement in this regard. Unfortunately she has some additional injury to the left lateral malleolus region which appears to be new likely even over the past several days. Again this determination is based on the overall appearance. With that being said the patient is obviously frustrated about this currently. 05/10/18-She is here in follow-up evaluation for bilateral lateral malleolar ulcers. She states she has purchased offloading shoes/boots and they will arrive tomorrow. She was asked to bring them in the office at next week's appointment so her provider is aware of product being utilized. She continues to sleep on right or left side, she has been encouraged to sleep on her back. The right lateral malleolus ulcer is precariously close to peri-osteum; will order xray. The left lateral malleolus ulcer is improved. Will switch back to santyl; she will follow up next week. 05/17/18 on evaluation today patient actually appears to be doing very well in regard to her malleolus her ulcers compared to last time I saw them. She does not seem to have as much in the way of contusion at this point which is great news. With that being said she does continue to have discomfort and I do believe that she is still continuing to benefit from the offloading/pressure reducing boots that were recommended. I think this is the key to trying to get this to heal  up completely. 05/24/18 on evaluation today patient actually appears to be doing worse at this point in time unfortunately compared to her last week's evaluation. She is having really no increased pain which is good news unfortunately she does have more maceration in your theme and noted surrounding the right lateral ankle the left lateral ankle is not really is erythematous I do not see signs of the overt cellulitis on that side. Unfortunately the wounds do not seem to have shown any signs of improvement since the last evaluation. She also has significant swelling especially on the right compared to previous some of this may be due to infection however also think that she may be served better while she has these wounds by compression wrapping versus continuing to use the Juxta-Lite for the time being. Especially with the amount of drainage that she is experiencing at this point. No fevers, chills, nausea, or vomiting noted at this time. 05/31/18 on evaluation today patient appears to actually be doing better in regard to her right lateral lower extremity ulcer specifically on the malleolus region. She has been tolerating the antibiotic without complication. With that being said she still continues to have issues but a little bit of redness although nothing like she what she was experiencing previous. She still continues to pressure to her ankle area she did get the problem on offloading boots unfortunately she will not wear them she states there too uncomfortable and she can't get in and out of the bed. Nonetheless at this point her wounds seem to be continually getting worse which is not what we want I'm getting somewhat concerned about her progress and how things are going to proceed if we do not intervene in some way shape or form. I therefore had a very lengthy conversation today about offloading yet again and even made a specific suggestion for switching her to a memory foam mattress and even gave  the information for a specific one that they could look at getting if  it was something that they were interested in considering. She does not want to be considered for a hospital bed air mattress although honestly insurance would not cover it that she does not have any wounds on her trunk. 06/14/18 on evaluation today both wounds over the bilateral lateral malleolus her ulcers appear to be doing better there's no evidence of pressure injury at this point. She did get the foam mattress for her bed and this does seem to have been extremely beneficial for her in my pinion. Her daughter states that she is having difficulty getting out of bed because of how soft it is. The patient also relates this to be. Nonetheless I do feel like she's actually doing better. Unfortunately right after and around the time she was getting the mattress she also sustained a fall when she got up to go pick up the phone and ended up injuring her right elbow she has 18 sutures in place. We are not caring for this currently although home health is going to be taking the sutures out shortly. Nonetheless this may be something that we need to evaluate going forward. It depends on [name], [name] ([id number]) how well it has or has not healed in the end. She also recently saw an orthopedic specialist for an injection in the right shoulder just before her fall unfortunately the fall seems to have worsened her pain. 06/21/18 on evaluation today patient appears to be doing about the same in regard to her lateral malleolus ulcers. Both appear to be just a little bit deeper but again we are clinging away the necrotic and dead tissue which I think is why this is progressing towards a deeper realm as opposed to improving from my measurement standpoint in that regard. Nonetheless she has been tolerating the dressing changes she absolutely hates the memory foam mattress topper that was obtained for her nonetheless I do believe this is still  doing excellent as far as taking care of excess pressure in regard to the lateral malleolus regions. She in fact has no pressure injury that I see whereas in weeks past it was week by week I was constantly seeing new pressure injuries. Overall I think it has been very beneficial for her. 07/03/18; patient arrives in my clinic today. She has deep punched out areas over her bilateral lateral malleoli. The area on the right has some more depth. We spent a lot of time today talking about pressure relief for these areas. This started when her daughter asked for a prescription for a memory foam mattress. I have never written a prescription for a mattress and I don't think insurances would pay for that on an ordinary bed. In any case he came up that she has foam boots that she refuses to wear. I would suggest going to these before any other offloading issues when she is in bed. They say she is meticulous about offloading this the rest of the day 07/10/18- She is seen in follow-up evaluation for bilateral, lateral malleolus ulcers. There is no improvement in the ulcers. She has purchased and is sleeping on a memory foam mattress/overlay, she has been using the offloading boots nightly over the past week. She has a follow up appointment with vascular medicine at the end of October, in my opinion this follow up should be expedited given her deterioration and suboptimal TBI results. We will order plain film xray of the left ankle as deeper structures are palpable; would consider having MRI, regardless of xray report(s).  The ulcers will be treated with iodoflex/iodosorb, she is unable to safely change the dressings daily with santyl. 07/19/18 on evaluation today patient appears to be doing in general visually well in regard to her bilateral lateral malleolus ulcers. She has been tolerating the dressing changes without complication which is good news. With that being said we did have an x-ray performed on 07/12/18  which revealed a slight loosen see in the lateral portion of the distal left fibula which may represent artifact but underline lytic destruction or osteomyelitis could not be excluded. MRI was recommended. With that being said we can see about getting the patient scheduled for an MRI to further evaluate this area. In fact we have that scheduled currently for August 20 19,019. 07/26/18 on evaluation today patient's wound on the right lateral ankle actually appears to be doing fairly well at this point in my pinion. She has made some good progress currently. With that being said unfortunately in regard to the left lateral ankle ulcer this seems to be a little bit more problematic at this time. In fact as I further evaluated the situation she actually had bone exposed which is the first time that's been the case in the bone appear to be necrotic. Currently I did review patient's note from Dr. Bunnie Domino office with [facility]. He stated that ABI was 1.26 on the right and 0.95 on the left with good waveforms. Her perfusion is stable not reduced from previous studies and her digital waveforms were pretty good particularly on the right. His conclusion upon review of the note was that there was not much she could do to improve her perfusion and he felt she was adequate for wound healing. His suggestion was that she continued to see [place] and consider a synthetic skin graft if there was no underlying infection. He plans to see her back in six months or as needed. 08/01/18 on evaluation today patient appears to be doing better in regard to her right lateral ankle ulcer. Her left lateral ankle ulcer is about the same she still has bone involvement in evidence of necrosis. There does not appear to be evidence of infection at this time On the right lateral lower extremity. I have started her on the Augmentin she picked this up and started this yesterday. This is to get her through until she  sees infectious disease which is scheduled for 08/12/18. 08/06/18 on evaluation today patient appears to be doing rather well considering my discussion with patient's daughter at the end of last week. The area which was marked where she had erythema seems to be improved and this is good news. With that being said overall the patient seems to be making good improvement when it comes to the overall appearance of the right lateral ankle ulcer although this has been slow she at least is coming around in this regard. Unfortunately in regard to the left lateral ankle ulcer this is osteomyelitis based on the pathology report as well is bone culture. Nonetheless we are still waiting CT scan. Unfortunately the MRI we originally ordered cannot be performed as the patient is a pacemaker which I had overlooked. Nonetheless we are working on the CT scan approval and scheduling as of now. She did go to the hospital over the weekend and was placed on IV Cefzo for a couple of days. Fortunately this seems to have improved the erythema quite significantly which is good news. There does not appear to be any evidence of  worsening infection at this time. She did have some bleeding after the last debridement therefore I did not perform any sharp debridement in regard to left lateral ankle at this point. Patient has been approved for a snap vac for the right lateral ankle. 08/14/18; the patient with wounds over her bilateral lateral malleoli. The area on the right actually looks quite good. Been using a snap back on this area. Healthy granulation and appears to be filling in. Unfortunately the area on the left is really problematic. 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 Also noted to have cellulitis. She saw Dr. Novella Olive of infectious disease today and was put on linezolid. We are able to verify this with her pharmacy. She is completed the  Augmentin that she was already on. We've been using Iodoflex to this area 08/23/18 on evaluation today patient's wounds both actually appear to be doing better compared to my prior evaluations. Fortunately she showing signs of good improvement in regard to the overall wound status especially where were using the snap vac on the right. In regard to left lateral malleolus the wound bed actually appears to be much cleaner than previously noted. I do not feel any phone directly probed during evaluation today and though there is tendon noted this does not appear to be necrotic it's actually fairly good as far as the overall appearance of the tendon is concerned. In general the wound bed actually appears to be doing significantly better than it was previous. Patient is currently in the care of [name] and I did review that note today. He actually has her on two weeks of linezolid and then following the patient will be on 1-2 months of Keflex. That is the plan currently. She has been on antibiotics therapy as prescribed by myself initially starting on July 30, 2018 and has been on that continuously up to this point. 08/30/18 on evaluation today patient actually appears to be doing much better in regard to her right lateral malleolus ulcer. She has been tolerating the dressing changes specifically the snap vac without complication although she did have some issues with the seal currently. Apparently there was some trouble with getting it to maintain over the past week past Sunday. Nonetheless overall the wound appears better in regard to the right lateral malleolus region. In regard to left lateral malleolus this actually show some signs of additional granulation although there still tendon noted in the base of the wound this appears to be healthy not necrotic in any way whatsoever. We are considering potentially using a snap vac for the left lateral malleolus as well the product wrap from [employer], [place], was  present in the clinic today we're going to see this patient I did have her come in with me after obtaining consent from the patient and her daughter in order to look at the wound and see if there's any recommendation one way or another as to whether or not they felt the snapback could be beneficial for the left lateral malleolus region. But [name], [name] ([id number]) the conclusion was that it might be but that this is definitely a little bit deeper wound than what traditionally would be utilized for a snap vac. 09/06/18 on evaluation today patient actually appears to be doing excellent in my pinion in regard to both ankle ulcers. She has been tolerating the dressing changes without complication which is great news. Specifically we have  been using the snap vac. In regard to the right ankle I'm not even sure that this is going to be necessary for today and following as the wound has filled in quite nicely. In regard to the left ankle I do believe that we're seeing excellent epithelialization from the edge as well as granulation in the central portion the tendon is still exposed but there's no evidence of necrotic bone and in general I feel like the patient has made excellent progress even compared to last week with just one week of the snap vac. 09/11/18; this is a patient who has wounds on her bilateral lateral malleoli. Initially both of these were deep stage IV wounds in the setting of chronic arterial insufficiency. She has been revascularized. As I understand think she been using snap vacs to both of these wounds however the area on the right became more superficial and currently she is only using it on the left. Using silver collagen on the right and silver collagen under the back on the left I believe 09/19/18 on evaluation today patient actually appears to be doing very well in regard to her lateral malleolus or ulcers bilaterally. She has been tolerating the dressing changes without  complication. Fortunately there does not appear to be any evidence of infection at this time. Overall I feel like she is improving in an excellent manner and I'm very pleased with the fact that everything seems to be turning towards the better for her. This has obviously been a long road. 09/27/18 on evaluation today patient actually appears to be doing very well in regard to her bilateral lateral malleolus ulcers. She has been tolerating the dressing changes without complication. Fortunately there does not appear to be any evidence of infection at this time which is also great news. No fevers, chills, nausea, or vomiting noted at this time. Overall I feel like she is doing excellent with the snap vac on the left malleolus. She had 40 mL of fluid collection over the past week. 10/04/18 on evaluation today patient actually appears to be doing well in regard to her bilateral lateral malleolus ulcers. She continues to tolerate the dressing changes without complication. One issue that I see is the snap vac on the left lateral malleolus which appears to have sealed off some fluid underlying this area and has not really allowed it to heal to the degree that I would like to see. For that reason I did suggest at this point we may want to pack a small piece of packing strip into this region to allow it to more effectively wick out fluid. 10/11/18 in general the patient today does not feel that she has been doing very well. She's been a little bit lethargic and subsequently is having bodyaches as well according to what she tells me today. With that being said overall she has been concerned with the fact that something may be worsening although to be honest her wounds really have not been appearing poorly. She does have a new ulcer on her left heel unfortunately. This may be pressure related. Nonetheless it seems to me to have potentially started at least as a blister I do not see any evidence of deep  tissue injury. In regard to the left ankle the snap vac still seems to be causing the ceiling off of the deeper part of the wound which is in turn trapping fluid. I'm not extremely pleased with the overall appearance as far as progress from last week to this  week therefore I'm gonna discontinue the snap vac at this point. 10/18/18 patient unfortunately this point has not been feeling well for the past several days. She was seen by Grayland Ormond her primary care provider who is a Librarian, academic at [facility]. Subsequently she states that she's been very weak and generally feeling malaise. No fevers, chills, nausea, or vomiting noted at this time. With that being said bloodwork was performed at the PCP office on the 11th of this month which showed a white blood cell count of 10.7. This was repeated today and shows a white blood cell count of 12.4. This does show signs of worsening. Coupled with the fact that she is feeling worse and that her left ankle wound is not really showing signs of improvement I feel like this is an indication that the osteomyelitis is likely exacerbating not improving. Overall I think we may also want to check her C-reactive protein and sedimentation rate. Actually did call [facility] office this afternoon while the patient was in the office here with me. Subsequently based on the findings we discussed treatment possibilities and I think that it is appropriate for [place] to go ahead and initiate treatment with doxycycline which I'm going to do. Subsequently he did agree to see about adding a CRP and sedimentation rate to her orders. If that has not already been drawn to where they can run it they will contact the patient she can come back to have that check. They are in agreement with plan as far as the patient and her daughter are concerned. Nonetheless also think we need to get in touch with Dr. Henreitta Leber office to see about getting the patient scheduled with him as  soon as possible. 11/08/18 on evaluation today patient presents for follow-up concerning her bilateral foot and ankle ulcers. I did do an extensive review of her chart in epic today. Subsequently she was seen by [name] he did initiate Cefepime IV antibiotic therapy. Subsequently she had some issues with her PICC line this had to be removed because it was coiled and then replaced. Fortunately that was now settled. Unfortunately she has continued have issues with her left heel as well as the issues that she is experiencing with her bilateral lateral malleolus regions. I do believe however both areas seem to be doing a little bit better on evaluation today which is good news. No fevers, chills, nausea, or vomiting noted at this time. She actually has an angiogram schedule with Dr. dew on this coming Monday, November 11, 2018. Subsequently the patient states that she is feeling much better especially than what she was roughly 2 weeks ago. She actually had to cancel an appointment because she was feeling so poorly. No fevers, chills, nausea, or vomiting noted at this time. 11/15/18 on evaluation today patient actually is status post having had her angiogram with Dr. dew Monday, four days ago. 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 He had to go and work on several areas of the vasculature fortunately he was able to obtain no more than a 30% residual stenosis throughout post procedure. I reviewed this note today. I think this will definitely help with healing at this time. Fortunately there does not appear to be any signs of infection and I do feel like ratio already has a better appearance to it. 11/22/18 upon evaluation today patient actually appears to be doing very well in regard to her wounds in general. The  right lateral malleolus looks excellent the heel looks better in the left lateral malleolus also appears to be doing a little better. With that being said the right  second toe actually appears to be open and training we been watching this is been dry and stable but now is open. 12/03/2018 Seen today for follow-up and management of multiple bilateral lower extremity wounds. New pressure injury of the great toe which is closed at this time. Wound of the right distal second toe appears larger today with deep undermining and a pocket of fluid present within the undermining region. Left and right malleolus is wounds are stable today with no signs and symptoms of infection.Denies any needs or concerns during exam today. 12/13/18 on evaluation today patient appears to be doing somewhat better in regard to her left heel ulcer. She also seems to be completely healed in regard to the right lateral malleolus ulcer. The left malleolus ulcer is smaller what unfortunately the wounds which are new over the first and second toes of the right foot are what are most concerning at this point especially the second. Both areas did require sharp debridement today. 12/20/18 on evaluation today patient's wound actually appears to be doing better in regard to left lateral ankle and her right lateral ankle continues to remain healed. The hill ulcer on the left is improved. She does have improvement noted as well in regard to both toe ulcers. Overall I'm very pleased in this regard. No fevers, chills, nausea, or vomiting noted at this time. [name], [name] ([id number]) 12/23/18 on evaluation today patient is seen after she had her toenails trimmed at the podiatrist office due to issues with her right great toe. There was what appeared to be dark eschar on the surface of the wound which had her in the podiatrist concerned. Nonetheless as I remember that during the last office visit I had utilize silver nitrate of this area I was much less concerned about the situation. Subsequently I was able to clean off much of this tissue without any complication today. This does not appear to show any  signs of infection and actually look somewhat better compared to last time post debridement. Her second toe on the right foot actually had callous over and there did appear still be some fluid underneath this that would require debridement today. 12/27/18 on evaluation today patient actually appears to be showing signs of improvement at all locations. Even the left lateral ankle although this is not quite as great as the other sites. Fortunately there does not appear to be any signs of infection at this time and both of her toes on the right foot seem to be showing signs of improvement which is good news and very pleased in this regard. 01/03/19 on evaluation today patient appears to be doing better for the most part in regard to her wounds in particular. There does not appear to be any evidence of infection at this time which is good news. Fortunately there is no sign of really worsening anywhere except for the right great toe which she does have what appears to be a bruise/deep tissue injury which is very superficial and already resolving. I'm not sure where this came from I questioned her extensively and she does not recall what may have happened with this. Other than that the patient seems to be doing well even the left lateral ankle ulcer looks good and is getting smaller. 01/10/19 on evaluation today patient appears to be doing well in  regard to her left heel wound and both of her toe wounds. Overall I feel like there is definitely improvement here and I'm happy in that regard. With that being said unfortunately she is having issues with the left lateral malleolus ulcer which unfortunately still has a lot of depth to it. This is gonna be a very difficult wound for [place] to be able to truly get to heal. I may want to consider some type of skin substitute to see if this would be of benefit for her. I'll discuss this with her more the next visit most likely. This was something I thought about more at the  end of the visit when I was Artie out of the room and the patient had been discharged. 01/17/19 on evaluation today patient appears to be doing very well in regard to her wounds in general. She's been making excellent progress at this time. Fortunately there's no sign of infection at this time either. No fevers, chills, nausea, or vomiting noted at this time. The biggest issue is still her left lateral malleolus where it appears to be doing well and is getting smaller but still shows a small corner where this is deeper and goes down into what appears to be the joint space. Nonetheless this is taking much longer to heal although it still looks better in smaller than previous evaluations. 01/24/19 on evaluation today patient's wounds actually appear to be doing rather well in general overall. She did require some sharp debridement in regard to the right great toe but everything else appears to be doing excellent no debridement was even necessary. No fevers, chills, nausea, or vomiting noted at this time. 01/31/19 on evaluation today patient actually appears to be doing much better in regard to her left foot wound on the heel as well as the ankle. The right great toe appears to be a little bit worse today this had callous over and trapped a lot of fluid underneath. Fortunately there's no signs of infection at any site which is great news. 02/07/19 on evaluation today patient actually appears to be doing decently well in regard to all of her ulcers at this point. No sharp debridement was required she is a little bit of hyper granulation in regard to the left lateral ankle as well as the left heel but the hill itself is almost completely healed which is excellent news. Overall been very pleased in this regard. 02/14/19 on evaluation today patient actually appears to be doing very well in regard to her ulcers on the right first toe, left lateral malleolus, and left heel. In fact the heel is almost completely  healed at this point. The patient does not show any signs of infection which is good news. Overall very pleased with how things have progressed. 04/18/19 Telehealth Evaluation During the COVID-19 National Emergency: Verbal Consent: Obtained from patient Allergies: reviewed and the active list is current. Medication changes: patient has no current medication changes. COVID-19 Screening: 1. Have you traveled internationally or on a cruise ship in the last 14 dayso No 2. Have you had contact with someone with or under investigation for COVID-19o No 3. Have you had a fever, cough, sore throat, or experiencing shortness of breatho No on evaluation today actually did have a visit with this patient through a telehealth encounter with her home health nurse. Subsequently it was noted that the patient actually appears to be doing okay in regard to her wounds both the right great toe as well as the  left lateral malleolus have shown signs of improvement although this in your theme around the left lateral malleolus there eschar coverings for both locations. The question is whether or not they are actually close and whether or not home health needs to discharge the patient or not. Nonetheless my concern is this point obviously is that without actually seeing her and being able to evaluate this directly I cannot ensure that she is completely healed which is the question that I'm being asked. 04/22/19 on evaluation today patient presents for her first evaluation since last time I saw her which was actually February 14, 2019. I did do a telehealth visit last week in which point it was questionable whether or not she may be healed and had to bring her in today for confirmation. With that being said she does seem to be doing quite well at this point which is good news. There does not appear to be any drainage in the deed I believe her wounds may be healed. Readmission: 09/04/2019 on evaluation today patient appears to  be doing unfortunately somewhat more poorly in regard to her left foot ulcer secondary to a wound that began on 08/21/2019 at least when she first noticed this. Fortunately she has not had any evidence of active infection at this time. Systemically. I also do not necessarily see any evidence of infection at the blister/wound site on the first metatarsal head plantar aspect. This almost appears to be something that may have just rubbed inappropriately causing this to breakdown. They did not want a wait too long to come in to be seen as again she had significant issues in the past with wounds that took quite a while to heal in fact it was close to 2 years. Nonetheless this does not appear to be quite that bad but again we do need to remove some of the necrotic tissue from the surface of the wound to tell exactly the extent. She does not appear to have any significant arterial disease at this point and again her last ABIs and TBI's are recorded above in the alert section her left ABI was 1.27 with a TBI of 0.72 to the right ABI 1.08 with a TBI of 0.39. Other than this the patient has been doing quite HAEVEN, NICKLE ([id number]) well since I last saw her and that was in May 2020. 09/11/2019 on evaluation today patient appeared to be doing very well with regard to her plantar foot ulcer on the left. In fact this appears to be almost completely healed which is awesome. That is after just 1 week of intervention. With that being said there is no signs of active infection at this time. 09/18/2019 on evaluation today patient actually appears to be doing excellent in fact she is completely healed based on what I am seeing at this point. Fortunately there is no signs of active infection at this time and overall patient is very pleased to hear that this area has healed so quickly. Readmission: 05/13/2021 upon evaluation today this patient presents for reevaluation here in the clinic. This is a wound that  actually we previously took care of. She had 1 on the right ankle and the left the left turned out to be be harder due to to heal but nonetheless is doing great at this point as the right that has reopened and it was noted first just several weeks ago with a scab over it and came off in just the past few days. Fortunately there does  not appear to be any obvious evidence of significant active infection at this time which is great news. No fevers, chills, nausea, vomiting, or diarrhea. 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 There does not appear to be any signs of this interfering in any way with her wound. She does have swelling we previously had compression socks for her ordered but again it does not look like she wears these on a regular basis by any means. 05/26/2021 upon evaluation today patient appears to be doing well with regard to her wound which is actually showing signs of excellent improvement. There does not appear to be any signs of active infection which is great news and overall very pleased with where things stand today. No fevers, chills, nausea, vomiting, or diarrhea. 06/02/2021 upon evaluation today patient's wound actually showing signs of excellent improvement. Fortunately there does not appear to be any signs of active infection which is great news. I think the patient is making good progress with regard to her wounds in general. 06/09/2021 upon evaluation today patient appears to be doing excellent in regard to her wounds currently. Fortunately there does not appear to be any signs of active infection which is great news. No fevers, chills, nausea, vomiting, or diarrhea. Overall extremely pleased with where things stand today. I think the patient is making excellent progress. 06/16/2021 upon evaluation today patient appears to be doing well in regard to her wound. This is going require little bit of debridement today and that was  discussed with the patient. Otherwise she seems to be doing quite well and I am actually very pleased with where things stand at this point. No fevers, chills, nausea, vomiting, or diarrhea. 06/23/2021 upon evaluation today patient appears to be doing well with regard to her wounds. She has been tolerating the dressing changes without complication. Fortunately there does not appear to be any evidence of infection and she has not had air in her home which she actually lives at an assisted living that got fixed this morning. With that being said because of that her wrap has been extremely hot and bothersome for her over the past week. 06/30/2021 upon evaluation today patient is actually making excellent progress in regard to her ankle ulcer. She has been tolerating the dressing changes without complication and overall extremely pleased with where things stand there does not appear to be any evidence of active infection which is great news. No fevers, chills, nausea, vomiting, or diarrhea. 07/07/21 upon evaluation today patients and culture on the right actually appears to be doing quite well. There does not appear to be any signs of infection and overall very pleased with where things stand today. No fevers, chills, nausea, or vomiting noted at this time. 07/14/2021 unfortunately the patient today has some evidence of deep tissue injury and pressure getting to the ankle region. Again I am not exactly sure what is going on here but this is very similar to issues that we have had in the past. I explained to the patient that she needs to be very mindful of exactly what is happening I think sleeping in bed is probably the main issue here although there could be other culprits I am not sure what else would potentially lead to this kind of a problem for her. 07/21/2021 upon evaluation today patient's wound actually showing signs of improvement compared to last week. Fortunately there does not appear to be any  signs of active  infection which is great news and overall very pleased with where things stand in that regard. With that being said I do believe that she is continuing to show signs of overall of getting better although I think this is still basically about what we were 2 weeks ago due to the worsening and now improvement. 07/28/2021 upon evaluation today patient appears to be doing well with regard to her wound. She does have some slough buildup on the surface of the wound which I would have to manage today. Fortunately there is no sign of active infection at this time. No fevers, chills, nausea, vomiting, or diarrhea. 08/04/2021 upon evaluation today patient appears to be doing about the same in regard to her wound. To be perfectly honest I am beginning to be a little bit concerned about the overall appearance of the wound bed. I do think possibly taking a sample right around the margin of the wound could be beneficial for her as far as identifying anything such as an inflammatory process or to be honest even a skin tag cancer type process that may be of concern here. Fortunately there does not appear to be any evidence of active infection at this time which is great news she is not having any pain also great news. 08/11/2021 upon evaluation today patient appears to be doing well with regard to her wound. The good news is I did review her biopsy results and it showed some inflammatory mixed findings but nothing that appeared to be malignant which is great news. Overall this is more of a chronic venous stasis type issue which again is more what we have been treating. Nonetheless I just wanted to make sure before going forward that there was not anything more untoward going on at this point. 08/18/2021 upon evaluation today patient appears to be doing well with regard to her ankle ulcer. Fortunately there does not appear to be any signs of active infection at this time which is great overall wound is  dramatically improved compared to last week. Since last week I have actually placed her on doxycycline and subsequently this is a good option as far as the findings are concerned at this point. 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 the doxycycline is doing that. There does not appear to be any evidence of active infection systemically which is great news. 08/25/2021 upon evaluation today patient appears to be doing well with regard to her wound. I feel like we are finally get back on track as far as healing is concerned I am much happier with the overall appearance today. I do think that she is tolerating the dressing changes without complication which is great news. We have been using Hydrofera Blue which I think is a good option. The good news is she is also doing great in [place], [employer]. ([id number]) regard to her compression sock on the left which is a zipper compression that seems to be doing a great job keeping her edema under good control. 09/01/2021 upon evaluation today patient appears to be doing well with regard to her wound. Infection seems to be under much better control which is great news and very pleased in that regard. Fortunately there does not appear to be any signs of infection currently. 09/08/2021 upon evaluation today patient actually appears to be making good progress in regard to her  wound. She has been tolerating the dressing changes without complication. Fortunately there does not appear to be any evidence of active infection at this time which is great news as well. No fevers, chills, nausea, vomiting, or diarrhea. 09/22/2021 upon evaluation today patient appears to be doing well with regard to her wound although is very slow to heal. We have not looked into Apligraf yet I think that is something that we should see about doing. 09/29/2021  upon evaluation today patient's wound is actually showing signs of doing about the same. I am not seeing any evidence of worsening but also no significant evidence of improvement. We did gain approval for the organogenesis products all except for Apligraf as covered by her insurance. With that being said I do think that we can go ahead and proceed with the NuShield if the patient and her family in agreement of the plan I discussed that with him today she does have a 20% coinsurance which we also discussed. 10/06/2021 upon evaluation today patient appears to unfortunately be doing a little bit worse she appears to be infected based on what I am seeing. Fortunately there does not appear to be any signs of active infection at this time which is great news. Unfortunately it does appear to be some evidence of around the wound edge indicated by way of erythema and warmth as well as redness 10/13/2021 upon evaluation today patient actually appears to be doing excellent in regard to her ankle ulcer compared to what it was. 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 I think were fine to go ahead and apply the NuShield today. 10/20/2021 upon evaluation today patient appears to be doing excellent in fact the NuShield seems to have done all some for her thus far. I am actually very pleased with where we stand and overall I think that she is making great progress. There is no evidence of active infection at this time. 11/23; patient presents for follow-up. She has no issues or complaints today. She denies signs of infection. She reports stability in her wound healing. 11/03/2021 upon evaluation today patient appears to be doing well with regard to her wounds. She has been tolerating the dressing changes without  complication. Fortunately there is no signs of active infection at this time. 11/10/2021 upon evaluation today patient appears to be doing well with regard to her right ankle which is actually showing signs of improvement with the NuShield I am very pleased. Subsequently in regards to the left ankle this appears to be doing okay with no evidence of issue here either. With that being said she has a significant contusion on the left leg from having fallen 2 days ago. She tells me that she was using her walker going to the closet and then when she got to the closet turned around to get something out at which point she fell according to the story. Nonetheless she does have a hematoma just below her knee on the anterior portion of her shin. My hope is that this will not open until wound although we do need I think Some compression over it also think that we need to have her use an ice as well to help with the swelling and prevent this from getting worse. The last thing she needs is a wound  opened up here. 11/17/2021 upon evaluation patient's right ankle actually showing signs of improvement based on what I see currently there is a lot of new skin growth coming in which is great news. Nonetheless I do feel like that the patient is showing improvement as well in regard to her left leg with the use of the Tubigrip it swollen but not as bruised as what I would have expected after what I was seeing last week. Nonetheless I do think doubling up on the Tubigrip would probably be beneficial to try to keep some of the edema under better control here. 11/24/2021 upon evaluation today patient appears to be doing excellent in regard to her wound. This is actually measuring significantly smaller which is great news. Fortunately I do not see any signs of active infection locally nor systemically at this point. Overall I am very pleased with how the NuShield is doing. 12/30; wound bed looks healthy however not much change  in overall wound volume. We applied N[place]ield again today in the standard fashion 12/08/2021 upon evaluation today patient's wound actually showing signs of good improvement and I am actually very pleased with where we stand today as well. I do not see any evidence of active infection locally nor systemically which is great news. Unfortunately she has been having some issues with stomach upset and diarrhea today. 12/15/2021 upon evaluation today patient appears to be doing excellent in regard to her wound. There is a little bit of dry skin raised up around the edges of the wound but fortunately nothing too significant at this point. Fortunately I do not see any evidence of active infection either which is great news. No fevers, chills, nausea, vomiting, or diarrhea. 12/29/2021 upon evaluation today patient appears to be doing well with regard to her wound. In general I feel like she is getting very close to complete resolution. I think that she is making good progress here as well. Overall I do not see any signs of infection and very little of this appears to actually be open. 01/12/2022 upon evaluation today patient appears to be doing a little bit worse in regard to her wound. It appears that the collagen actually trapped fluid underneath the collagen which got hard and subsequently caused the fluid to back up. This patient has made the wound appear to be larger than what it was previous. With that being said I do not see any signs of obvious infection is not warm to touch and not significantly erythematous either which is good news. Nonetheless I do think that we will need to continue to keep an eye on this. Probably not go put on antibiotic this week but I will be too far from doing so she is actually on a Z-Pak right now I do not want to really double up on antibiotics. 01/26/2022 upon evaluation today patient's wound is showing signs of improvement although this is slow to turn back around. Fortunately  there does not appear to be any evidence of active infection locally or systemically which is great news and overall I am extremely pleased with where we stand today. No fevers, chills, nausea, vomiting, or diarrhea. 02/02/2022 upon evaluation today patient appears to be doing well with regard to her wound. She has been tolerating the dressing changes without complication. Fortunately there does not appear to be any signs of active infection locally nor systemically at this time which is great news. No fevers, chills, nausea, vomiting, or diarrhea. [name], [name] ([id number]) 3/9; patient  presents for follow-up. She has no issues or complaints today. She denies signs of infection. 02/16/2022 upon evaluation today patient appears to be doing well with regard to her wound. She has been tolerating the dressing changes without complication. Fortunately I do not see any evidence of active infection locally nor systemically at this point which is great news. Overall I think that the patient is making excellent progress in general. The wound is measuring smaller and though there does appear to be some need for sharp debridement today this is minimal compared to what we have noted previous. 02/23/2022 upon evaluation today patient appears to be doing well with regard to her wound she is definitely showing signs of improvement which is great news. 03/02/2022 upon evaluation today patient appears to be doing about the same in regard to her wound. Unfortunately were not seeing significant improvement. With that being said is also not significantly worse which is great news. No fevers, chills, nausea, vomiting, or diarrhea. 03-09-2022 upon evaluation today patient appears to be doing well with regard to her wound. She has been tolerating the dressing changes without complication. Fortunately there does not appear to be any signs of active infection locally or systemically which is great news. 03-16-2022 upon  evaluation today patient appears to be doing well with regard to her wound this is measuring smaller and looking much better. I am actually very pleased with where we stand and I think that the patient is making great progress. I do not see any evidence of active infection locally or systemically which is great news. No fevers, chills, nausea, vomiting, or diarrhea. 03-23-2022 upon evaluation today patient appears to be doing better in regard to her wounds. In fact the wound area actually is showing signs of excellent improvement and actually very pleased with where we stand today. I do not see any evidence of active infection locally nor systemically which is great news. 4/27; comes in today with again thick callus around the wound circumference which I removed with a curette. Some debris on the surface. Overall I do not think quite as good as it was last week by description. Using endoform 04-06-2022 upon evaluation today patient appears to be doing pretty well in regard to her wound. Fortunately I do not see any evidence of active infection locally or systemically which is great news and overall I am pleased in that regard. I do feel like this is measuring smaller postdebridement compared to where we were previous. Overall she is headed in the right direction. She does have an area that is threatening to open and looks a little irritated on the left ankle we did measure this just for the discolored area for now its not draining too much but we want to monitor and make sure nothing worsens here. 04-13-2022 upon evaluation today patient appears to be doing better in regard to her wound although this is still very slow to heal. We have reapplied for a skin substitute but have not heard anything back as of yet. Fortunately I do not see any evidence of active infection locally or systemically at this time which is great news. 04-20-2022 upon evaluation today patient's wound is actually showing signs of  significant improvement which is great news. We did get approval for skin substitutes which I think is an option here for [place] but at the same time I am not even certain that this is going to be necessary especially considering how well things seem to be doing at this  point. If she continues healing as we see her right now but I think were probably going to be able to avoid any need for additional skin substitute. Patient and her daughter are both in agreement with that plan. With that being said she did have a fall she has several areas of contusion fortunately nothing that is can require intervention at this point but nonetheless she did also fracture her rib which is extremely uncomfortable obviously. 04-27-2022 upon evaluation today patient appears to be doing well currently in regard to her wound on the right lateral ankle. Overall I feel like she is actually doing significantly better even compared to last week and very pleased in this regard. I think we are on the right track here. 05-04-2022 upon evaluation today patient appears to be doing well with regard to her wound. She is going require some sharp debridement today to clear away some of the necrotic debris. Fortunately I was able to do this quite easily without significant issue or breakdown here. There is a very small area of opening centrally but really this is pretty close to getting close. 05-11-2022 upon evaluation today patient's wound is actually showing signs of excellent improvement. I do not see any evidence of infection locally or systemically at this time. Overall I think she is very close to complete resolution with just a little bit of debridement to clear away some of the dry crusty area around the edges of the wound currently. 05-18-2022 upon evaluation patient's wound bed actually appears to be likely healed although I cannot be 100% sure I am extremely pleased however with where things stand today. I do not see any evidence of  infection locally or systemically which is great news and overall I think you are on the right track here. 05-25-2022 upon evaluation today patient appears to be doing well with regard to her leg ulcer. Fortunately there does not appear to be any signs of active infection locally or systemically at this time which is great news. No fevers, chills, nausea, vomiting, or diarrhea. With that being said she does appear to be healed in regard to her right lateral ankle which is great news. In regard to her leg unfortunately during the removal of the compression wrap there was a small skin tear that occurred with the scissors. Subsequently the patient does have a minimal amount of bleeding this is going require a couple Steri-Strips in order to seal this up. I do think it should likely heal quite well. Obviously this was unintentional. Nonetheless we do have to definitely monitor make sure that this completely heals up as well I am hopeful that we will be the case by next week to be honest. 06-01-2022 upon evaluation today patient appears to be doing excellent in regard to her wounds. Both areas are completely healed which is great news. Fortunately I do not see any signs of active infection locally or systemically at this time which is great and overall I am extremely pleased in that regard. No fevers, chills, nausea, vomiting, or diarrhea. [name], [name] ([id number]) Objective Constitutional Well-nourished and well-hydrated in no acute distress. Vitals Time Taken: 3:01 PM, Height: 62 in, Weight: 150 lbs, BMI: 27.4, Temperature: 98 F, Pulse: 64 bpm, Respiratory Rate: 16 breaths/min, Blood Pressure: 125/64 mmHg. Respiratory normal breathing without difficulty. Psychiatric this patient is able to make decisions and demonstrates good insight into disease process. Alert and Oriented x 3. pleasant and cooperative. General Notes: Upon inspection patient appears to be completely  healed which is great  news in regard to both wounds I see no evidence of active infection locally or systemically at this time. Integumentary (Hair, Skin) Wound #9 status is Open. Original cause of wound was Skin Tear/Laceration. The date acquired was: 05/25/2022. The wound has been in treatment 1 weeks. The wound is located on the Right,Lateral Lower Leg. The wound measures 0cm length x 0cm width x 0cm depth; 0cm^2 area and 0cm^3 volume. There is no tunneling or undermining noted. There is a none present amount of drainage noted. There is no granulation within the wound bed. There is no necrotic tissue within the wound bed. Assessment Active Problems ICD-10 Pressure ulcer of right ankle, stage 3 Lymphedema, not elsewhere classified Laceration without foreign body, right lower leg, initial encounter Venous insufficiency (chronic) (peripheral) Presence of cardiac pacemaker Long term (current) use of anticoagulants Plan Discharge From [name]: Discharge from [facility] Complete Wear compression garments daily. Put garments on first thing when you wake up and remove them before bed. 1. I am good recommend that we go ahead and continue with the wound care measures as before and the patient is in agreement with the plan. This includes the use of a compression sock but other than that she is not getting need any actual dressings covering her wounds at this point. We will see patient back for reevaluation in 1 week here in the clinic. If anything worsens or changes patient will contact our office for additional recommendations. Electronic Signature(s) Signed: 06/02/2022 7:54:28 AM By: Worthy Keeler PA-C Entered By: Worthy Keeler on 06/02/2022 07:54:28 Rena, [name] ([id number]) -------------------------------------------------------------------------------- SuperBill Details Patient Name: [name] Date of Service: 06/01/2022 Medical Record Number: [id number] Patient Account  Number: [id number] Date of Birth/Sex: 1923-06-17 (88 y.o. F) Treating RN: Carlene Coria Primary Care Provider: Adrian Prows Other Clinician: Referring Provider: Adrian Prows Treating Provider/Extender: Skipper Cliche in Treatment: 54 Diagnosis Coding ICD-10 Codes Code Description [phone] Pressure ulcer of right ankle, stage 3 I89.0 Lymphedema, not elsewhere classified S81.811A Laceration without foreign body, right lower leg, initial encounter I87.2 Venous insufficiency (chronic) (peripheral) Z95.0 Presence of cardiac pacemaker Z79.01 Long term (current) use of anticoagulants Facility Procedures CPT4 Code: [id number] Description: [phone] - WOUND CARE VISIT-LEV 2 EST PT Modifier: Quantity: 1 Physician Procedures CPT4 Code: [id number] Description: 06269 - WC PHYS LEVEL 3 - EST PT Modifier: Quantity: 1 CPT4 Code: Description: ICD-10 Diagnosis Description 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, stage 3 I89.0 Lymphedema, not elsewhere classified S81.811A Laceration without foreign body, right lower leg, initial encounter I87.2 Venous insufficiency (chronic)  (peripheral) Modifier: Quantity: Electronic Signature(s) Signed: 06/02/2022 7:55:36 AM By: Worthy Keeler PA-C Entered By: Worthy Keeler on 06/02/2022 07:55:36

## 2022-06-02 NOTE — Progress Notes (Signed)
[name], [name] ([id number]) Visit Report for 06/01/2022 Arrival Information Details Patient Name: [name], [name]. Date of Service: 06/01/2022 2:45 PM Medical Record Number: [id number] Patient Account Number: [id number] Date of Birth/Sex: 26-Sep-1923 (88 y.o. F) Treating RN: Carlene Coria Primary Care Marvene Strohm: Adrian Prows Other Clinician: Referring Chrsitopher Wik: Adrian Prows Treating Khaden Gater/Extender: Skipper Cliche in Treatment: 70 Visit Information History Since Last Visit All ordered tests and consults were completed: No Patient Arrived: [name] Added or deleted any medications: No Arrival Time: 14:57 Any new allergies or adverse reactions: No Accompanied By: daughter Had a fall or experienced change in No Transfer Assistance: None activities of daily living that may affect Patient Identification Verified: Yes risk of falls: Secondary Verification Process Completed: Yes Signs or symptoms of abuse/neglect since last visito No Patient Requires Transmission-Based No Hospitalized since last visit: No Precautions: Implantable device outside of the clinic excluding No Patient Has Alerts: Yes cellular tissue based products placed in the center Patient Alerts: Patient on Blood since last visit: Thinner Has Dressing in Place as Prescribed: Yes NOT DIABETIC Has Compression in Place as Prescribed: Yes ***ELIQUIS*** Pain Present Now: No Electronic Signature(s) Signed: 06/02/2022 10:10:35 AM By: Carlene Coria RN Entered By: Carlene Coria on 06/01/2022 15:01:42 Schillo, [name] ([id number]) -------------------------------------------------------------------------------- Clinic Level of Care Assessment Details Patient Name: [name] Date of Service: 06/01/2022 2:45 PM Medical Record Number: [id number] Patient Account Number: [id number] Date of Birth/Sex: 04-24-1923 (88 y.o. F) Treating RN: Carlene Coria Primary Care Janasia Coverdale: Adrian Prows Other  Clinician: Referring Mareo Portilla: Adrian Prows Treating Sharmane Dame/Extender: Skipper Cliche in Treatment: 63 Clinic Level of Care Assessment Items TOOL 4 Quantity Score X - Use when only an EandM is performed on FOLLOW-UP visit 1 0 ASSESSMENTS - Nursing Assessment / Reassessment X - Reassessment of Co-morbidities (includes updates in patient status) 1 10 X- 1 5 Reassessment of Adherence to Treatment Plan ASSESSMENTS - Wound and Skin Assessment / Reassessment X - Simple Wound Assessment / Reassessment - one wound 1 5 '[]'$  - 0 Complex Wound Assessment / Reassessment - multiple wounds '[]'$  - 0 Dermatologic / Skin Assessment (not related to wound area) ASSESSMENTS - Focused Assessment '[]'$  - Circumferential Edema Measurements - multi extremities 0 '[]'$  - 0 Nutritional Assessment / Counseling / Intervention '[]'$  - 0 Lower Extremity Assessment (monofilament, tuning fork, pulses) '[]'$  - 0 Peripheral Arterial Disease Assessment (using hand held doppler) ASSESSMENTS - Ostomy and/or Continence Assessment and Care '[]'$  - Incontinence Assessment and Management 0 '[]'$  - 0 Ostomy Care Assessment and Management (repouching, etc.) PROCESS - Coordination of Care X - Simple Patient / Family Education for ongoing care 1 15 '[]'$  - 0 Complex (extensive) Patient / Family Education for ongoing care '[]'$  - 0 Staff obtains Programmer, systems, Records, Test Results / Process Orders '[]'$  - 0 Staff telephones HHA, Nursing Homes / Clarify orders / etc '[]'$  - 0 Routine Transfer to another Facility (non-emergent condition) '[]'$  - 0 Routine Hospital Admission (non-emergent condition) '[]'$  - 0 New Admissions / Biomedical engineer / Ordering NPWT, Apligraf, etc. '[]'$  - 0 Emergency Hospital Admission (emergent condition) X- 1 10 Simple Discharge Coordination '[]'$  - 0 Complex (extensive) Discharge Coordination PROCESS - Special Needs '[]'$  - Pediatric / Minor Patient Management 0 '[]'$  - 0 Isolation Patient Management '[]'$  - 0 Hearing /  Language / Visual special needs '[]'$  - 0 Assessment of Community assistance (transportation, D/C planning, etc.) '[]'$  - 0 Additional assistance / Altered mentation '[]'$  - 0 Support Surface(s) Assessment (bed, cushion, seat, etc.) INTERVENTIONS -  Wound Cleansing / Measurement CHARLITA, BRIAN ([id number]) X- 1 5 Simple Wound Cleansing - one wound '[]'$  - 0 Complex Wound Cleansing - multiple wounds X- 1 5 Wound Imaging (photographs - any number of wounds) '[]'$  - 0 Wound Tracing (instead of photographs) X- 1 5 Simple Wound Measurement - one wound '[]'$  - 0 Complex Wound Measurement - multiple wounds INTERVENTIONS - Wound Dressings '[]'$  - Small Wound Dressing one or multiple wounds 0 '[]'$  - 0 Medium Wound Dressing one or multiple wounds '[]'$  - 0 Large Wound Dressing one or multiple wounds '[]'$  - 0 Application of Medications - topical '[]'$  - 0 Application of Medications - injection INTERVENTIONS - Miscellaneous '[]'$  - External ear exam 0 '[]'$  - 0 Specimen Collection (cultures, biopsies, blood, body fluids, etc.) '[]'$  - 0 Specimen(s) / Culture(s) sent or taken to Lab for analysis '[]'$  - 0 Patient Transfer (multiple staff / Civil Service fast streamer / Similar devices) '[]'$  - 0 Simple Staple / Suture removal (25 or less) '[]'$  - 0 Complex Staple / Suture removal (26 or more) '[]'$  - 0 Hypo / Hyperglycemic Management (close monitor of Blood Glucose) '[]'$  - 0 Ankle / Brachial Index (ABI) - do not check if billed separately X- 1 5 Vital Signs Has the patient been seen at the hospital within the last three years: Yes Total Score: 65 Level Of Care: New/Established - Level 2 Electronic Signature(s) Signed: 06/02/2022 10:10:35 AM By: Carlene Coria RN Entered By: Carlene Coria on 06/01/2022 15:31:44 Dargan, [name] ([id number]) -------------------------------------------------------------------------------- Encounter Discharge Information Details Patient Name: [name] Date of Service: 06/01/2022 2:45 PM Medical  Record Number: [id number] Patient Account Number: [id number] Date of Birth/Sex: 08-06-1923 (88 y.o. F) Treating RN: Carlene Coria Primary Care Isay Perleberg: Adrian Prows Other Clinician: Referring Kani Chauvin: Adrian Prows Treating Aarion Metzgar/Extender: Skipper Cliche in Treatment: 79 Encounter Discharge Information Items Discharge Condition: Stable Ambulatory Status: Walker Discharge Destination: Home Transportation: Private Auto Accompanied By: daughter Schedule Follow-up Appointment: Yes Clinical Summary of Care: Electronic Signature(s) Signed: 06/02/2022 10:10:35 AM By: Carlene Coria RN Entered By: Carlene Coria on 06/01/2022 15:32:49 Hodgkin, [name] ([id number]) -------------------------------------------------------------------------------- Lower Extremity Assessment Details Patient Name: [name] Date of Service: 06/01/2022 2:45 PM Medical Record Number: [id number] Patient Account Number: [id number] Date of Birth/Sex: 29-Jun-1923 (88 y.o. F) Treating RN: Carlene Coria Primary Care Galaxy Borden: Adrian Prows Other Clinician: Referring Travonta Gill: Adrian Prows Treating Maksymilian Mabey/Extender: Skipper Cliche in Treatment: 23 Electronic Signature(s) Signed: 06/02/2022 10:10:35 AM By: Carlene Coria RN Entered By: Carlene Coria on 06/01/2022 15:11:45 Ralphs, [name] ([id number]) -------------------------------------------------------------------------------- Multi Wound Chart Details Patient Name: [name] Date of Service: 06/01/2022 2:45 PM Medical Record Number: [id number] Patient Account Number: [id number] Date of Birth/Sex: 06-22-1923 (88 y.o. F) Treating RN: Carlene Coria Primary Care Rodderick Holtzer: Adrian Prows Other Clinician: Referring Omkar Stratmann: Adrian Prows Treating Klint Lezcano/Extender: Skipper Cliche in Treatment: 33 Vital Signs Height(in): 62 Pulse(bpm): 20 Weight(lbs): 150 Blood Pressure(mmHg): 125/64 Body Mass Index(BMI):  27.4 Temperature(F): 98 Respiratory Rate(breaths/min): 16 Photos: [9:No Photos] [N/A:N/A] Wound Location: [9:Right, Lateral Lower Leg] [N/A:N/A] Wounding Event: [9:Skin Tear/Laceration] [N/A:N/A] Primary Etiology: [9:Trauma, Other] [N/A:N/A] Comorbid History: [9:Cataracts, Congestive Heart Failure, N/A Hypertension, Peripheral Arterial Disease, Osteoarthritis, Neuropathy] Date Acquired: [9:05/25/2022] [N/A:N/A] Weeks of Treatment: [9:1] [N/A:N/A] Wound Status: [9:Open] [N/A:N/A] Wound Recurrence: [9:No] [N/A:N/A] Measurements L x W x D (cm) [9:0x0x0] [N/A:N/A] Area (cm) : [9:0] [N/A:N/A] Volume (cm) : [9:0] [N/A:N/A] % Reduction in Area: [9:100.00%] [N/A:N/A] % Reduction in Volume: [9:100.00%] [N/A:N/A] Classification: [9:Full Thickness Without Exposed Support Structures] [  N/A:N/A] Exudate Amount: [9:None Present] [N/A:N/A] Granulation Amount: [9:None Present (0%)] [N/A:N/A] Necrotic Amount: [9:None Present (0%)] [N/A:N/A] Exposed Structures: [9:Fascia: No Fat Layer (Subcutaneous Tissue): No Tendon: No Muscle: No Large (67-100%)] [N/A:N/A N/A] Treatment Notes Electronic Signature(s) Signed: 06/02/2022 10:10:35 AM By: Carlene Coria RN Entered By: Carlene Coria on 06/01/2022 15:22:20 Oliff, [name] ([id number]) -------------------------------------------------------------------------------- [place] Details Patient Name: [name] Date of Service: 06/01/2022 2:45 PM Medical Record Number: [id number] Patient Account Number: [id number] Date of Birth/Sex: 1923/09/21 (88 y.o. F) Treating RN: Carlene Coria Primary Care Keianna Signer: Adrian Prows Other Clinician: Referring Oni Dietzman: Adrian Prows Treating Janmarie Smoot/Extender: Skipper Cliche in Treatment: 41 Active Inactive Electronic Signature(s) Signed: 06/02/2022 10:10:35 AM By: Carlene Coria RN Entered By: Carlene Coria on 06/01/2022 15:22:07 [name]  ([id number]) -------------------------------------------------------------------------------- Pain Assessment Details Patient Name: [name] Date of Service: 06/01/2022 2:45 PM Medical Record Number: [id number] Patient Account Number: [id number] Date of Birth/Sex: 1923-05-22 (88 y.o. F) Treating RN: Carlene Coria Primary Care Swetha Rayle: Adrian Prows Other Clinician: Referring Finlee Concepcion: Adrian Prows Treating Spencer Peterkin/Extender: Skipper Cliche in Treatment: 88 Active Problems Location of Pain Severity and Description of Pain Patient Has Paino No Site Locations Pain Management and Medication Current Pain Management: Electronic Signature(s) Signed: 06/02/2022 10:10:35 AM By: Carlene Coria RN Entered By: Carlene Coria on 06/01/2022 15:02:37 Knipple, [name] ([id number]) -------------------------------------------------------------------------------- Patient/Caregiver Education Details Patient Name: [name] Date of Service: 06/01/2022 2:45 PM Medical Record Number: [id number] Patient Account Number: [id number] Date of Birth/Gender: February 15, 1923 (88 y.o. F) Treating RN: Carlene Coria Primary Care Physician: Adrian Prows Other Clinician: Referring Physician: Adrian Prows Treating Physician/Extender: Skipper Cliche in Treatment: 64 Education Assessment Education Provided To: Patient Education Topics Provided Wound/Skin Impairment: Methods: Explain/Verbal Responses: State content correctly Electronic Signature(s) Signed: 06/02/2022 10:10:35 AM By: Carlene Coria RN Entered By: Carlene Coria on 06/01/2022 15:32:01 Pacitti, [name] ([id number]) -------------------------------------------------------------------------------- Wound Assessment Details Patient Name: [name] Date of Service: 06/01/2022 2:45 PM Medical Record Number: [id number] Patient Account Number: [id number] Date of Birth/Sex: 07-08-1923 (88 y.o. F) Treating RN: Carlene Coria Primary Care Ledger Heindl: Adrian Prows Other Clinician: Referring Jaidon Sponsel: Adrian Prows Treating Jalynn Waddell/Extender: Skipper Cliche in Treatment: 40 Wound Status Wound Number: 9 Primary Trauma, Other Etiology: Wound Location: Right, Lateral Lower Leg Wound Open Wounding Event: Skin Tear/Laceration Status: Date Acquired: 05/25/2022 Comorbid Cataracts, Congestive Heart Failure, Hypertension, Weeks Of Treatment: 1 History: Peripheral Arterial Disease, Osteoarthritis, Neuropathy Clustered Wound: No Wound Measurements Length: (cm) 0 Width: (cm) 0 Depth: (cm) 0 Area: (cm) 0 Volume: (cm) 0 % Reduction in Area: 100% % Reduction in Volume: 100% Epithelialization: Large (67-100%) Tunneling: No Undermining: No Wound Description Classification: Full Thickness Without Exposed Support Structures Exudate Amount: None Present Foul Odor After Cleansing: No Slough/Fibrino No Wound Bed Granulation Amount: None Present (0%) Exposed Structure Necrotic Amount: None Present (0%) Fascia Exposed: No Fat Layer (Subcutaneous Tissue) Exposed: No Tendon Exposed: No Muscle Exposed: No Electronic Signature(s) Signed: 06/02/2022 10:10:35 AM By: Carlene Coria RN Entered By: Carlene Coria on 06/01/2022 15:11:32 Meckler, [name] ([id number]) -------------------------------------------------------------------------------- [place] Details Patient Name: [name] Date of Service: 06/01/2022 2:45 PM Medical Record Number: [id number] Patient Account Number: [id number] Date of Birth/Sex: 02-09-1923 (88 y.o. F) Treating RN: Carlene Coria Primary Care Farrell Pantaleo: Adrian Prows Other Clinician: Referring Jebediah Macrae: Adrian Prows Treating Rasheen Bells/Extender: Skipper Cliche in Treatment: 55 Vital Signs Time Taken: 15:01 Temperature (F): 98 Height (in): 62 Pulse (bpm): 64 Weight (lbs): 150 Respiratory Rate (breaths/min): 16 Body  Mass Index (BMI): 27.4 Blood Pressure  (mmHg): 125/64 Reference Range: 80 - 120 mg / dl Electronic Signature(s) Signed: 06/02/2022 10:10:35 AM By: Carlene Coria RN Entered By: Carlene Coria on 06/01/2022 15:02:31

## 2022-06-08 ENCOUNTER — Ambulatory Visit: Payer: Medicare Other | Admitting: Physician Assistant

## 2022-06-09 ENCOUNTER — Telehealth: Payer: Self-pay

## 2022-06-09 NOTE — Telephone Encounter (Signed)
Delaney from [facility] called. She is wondering if they can try a Spinomed brace which is similar to a TLSO brace but without the sternal bar. Another option may be a Jewett to reduce her ability to lean forward.  [phone]

## 2022-06-12 ENCOUNTER — Telehealth: Payer: Self-pay

## 2022-06-12 NOTE — Telephone Encounter (Signed)
Please notify [facility]

## 2022-06-12 NOTE — Telephone Encounter (Signed)
We have received a refill request from [facility] for Celebrex '100mg'$  by mouth bid, however in your last note it says you wanted to put her on this for short term. Please send in if  appropriate.

## 2022-06-12 NOTE — Telephone Encounter (Signed)
[name] has been notified.

## 2022-06-13 ENCOUNTER — Other Ambulatory Visit: Payer: Self-pay

## 2022-06-13 DIAGNOSIS — S22080D Wedge compression fracture of T11-T12 vertebra, subsequent encounter for fracture with routine healing: Secondary | ICD-10-CM

## 2022-06-15 ENCOUNTER — Encounter: Payer: Self-pay | Admitting: Neurosurgery

## 2022-06-15 ENCOUNTER — Ambulatory Visit (INDEPENDENT_AMBULATORY_CARE_PROVIDER_SITE_OTHER): Payer: Medicare Other | Admitting: Neurosurgery

## 2022-06-15 ENCOUNTER — Ambulatory Visit
Admission: RE | Admit: 2022-06-15 | Discharge: 2022-06-15 | Disposition: A | Discharge status: Home / Self Care | Payer: Medicare Other | Attending: Neurosurgery | Admitting: Neurosurgery

## 2022-06-15 ENCOUNTER — Ambulatory Visit: Payer: Medicare Other | Admitting: Physician Assistant

## 2022-06-15 ENCOUNTER — Ambulatory Visit
Admission: RE | Admit: 2022-06-15 | Discharge: 2022-06-15 | Disposition: A | Discharge status: Home / Self Care | Payer: Medicare Other | Source: Ambulatory Visit | Attending: Neurosurgery | Admitting: Neurosurgery

## 2022-06-15 VITALS — BP 112/72 | Ht 66.0 in | Wt 141.2 lb

## 2022-06-15 DIAGNOSIS — W19XXXD Unspecified fall, subsequent encounter: Secondary | ICD-10-CM

## 2022-06-15 DIAGNOSIS — S22080D Wedge compression fracture of T11-T12 vertebra, subsequent encounter for fracture with routine healing: Secondary | ICD-10-CM | POA: Diagnosis present

## 2022-06-15 DIAGNOSIS — M8448XS Pathological fracture, other site, sequela: Secondary | ICD-10-CM

## 2022-06-15 NOTE — Progress Notes (Signed)
Follow-up note: Referring Physician:  Leonel Ramsay, MD [place],  [place] [id number]  Primary Physician:  [name] Ramsay, MD  Chief Complaint:  4 week follow up of fracture  History of Present Illness: [name] is a 88 y.o. female who presents for 4 week fracture follow up.  While she does continue to have some back pain particular on the left-hand side, this is improved since her last visit.  She does continue to take tramadol 3 times a day as well as Celebrex about once a day.  She did have an episode of confusion a week or so ago but this is since resolved.  She went to get fitted for a brace however she did not like it and therefore did not pick it up.  She denies any new symptoms.  LOV 05/18/2022 Ms. [name] is a 88 y.o who is here today for ER follow up of T11 fracture after a chemical fall on 04/15/2022. She continues to have significant localized back pain despite tramadol and Tylenol. She has not been wearing a brace.   Review of Systems:  A 10 point review of systems is negative, and the pertinent positives and negatives detailed in the HPI.  Past Medical History: Past Medical History:  Diagnosis Date   Anemia    Atrial fibrillation [facility])    Dysrhythmia 2010   has pacemaker   GERD (gastroesophageal reflux disease)    HOH (hard of hearing)    can not hear at all in right ear. does not wear hearing aides   Hypothyroidism    Neuropathy    feet   Peripheral vascular disease ([name])    Pneumonia 2010   had collapsed lung   Presence of permanent cardiac pacemaker    2010   Skin cancer, basal cell    skin cancer    Stroke ([place]) 2000   mini, per daughter. had blood clot in right ear   Varicose veins    Vertigo     Past Surgical History: Past Surgical History:  Procedure Laterality Date   ANGIOPLASTY Right 2015   unsure if stent was placed. dr dew performed procedure   APPENDECTOMY     BASAL CELL CARCINOMA EXCISION Left     BREAST CYST EXCISION Left    CATARACT EXTRACTION Left    CHOLECYSTECTOMY     ECTROPION REPAIR Left 02/20/2020   Procedure: ECTROPION REPAIR, TARSAL WEDGE  ECTROPION REPAIR, EXTENSIVE;  Surgeon: Karle Starch, MD;  Location: [facility];  Service: Ophthalmology;  Laterality: Left;   EYE SURGERY Bilateral 2011, 2014   cataract surgery   LOWER EXTREMITY ANGIOGRAPHY Left 11/11/2018   Procedure: LOWER EXTREMITY ANGIOGRAPHY;  Surgeon: Algernon Huxley, MD;  Location: [facility];  Service: Cardiovascular;  Laterality: Left;   PACEMAKER PLACEMENT     PERIPHERAL VASCULAR CATHETERIZATION Right 10/09/2016   Procedure: Lower Extremity Angiography;  Surgeon: Algernon Huxley, MD;  Location: [facility];  Service: Cardiovascular;  Laterality: Right;   PERIPHERAL VASCULAR CATHETERIZATION  10/09/2016   Procedure: Lower Extremity Intervention;  Surgeon: Algernon Huxley, MD;  Location: [facility];  Service: Cardiovascular;;   TONSILLECTOMY      Allergies: Allergies as of 06/15/2022 - Review Complete 06/01/2022  Allergen Reaction Noted   Chlorpheniramine  04/28/2014   Fluconazole Rash 01/30/2017   Gabapentin Rash 11/08/2018   Metronidazole Nausea And Vomiting, Other (See Comments), and Rash 09/24/2015   Monistat  [  tioconazole] Rash and Other (See Comments) 09/24/2015   Sulfa antibiotics Rash and Nausea And Vomiting 04/28/2014    Medications: Outpatient Encounter Medications as of 06/15/2022  Medication Sig   acetaminophen (TYLENOL) 650 MG CR tablet Take 650 mg by mouth every 8 (eight) hours as needed for pain.   apixaban (ELIQUIS) 2.5 MG TABS tablet Take 2.5 mg by mouth 2 (two) times daily.   Carboxymethylcellulose Sodium (ARTIFICIAL TEARS OP) Apply to eye as needed.   diltiazem (TIAZAC) 300 MG 24 hr capsule Take 300 mg by mouth daily.   furosemide (LASIX) 20 MG tablet Take 20 mg by mouth daily.    HYDROcodone-acetaminophen (NORCO/VICODIN) 5-325 MG tablet Take 1 tablet by  mouth every 4 (four) hours as needed for moderate pain.   levothyroxine (SYNTHROID) 25 MCG tablet Take 25 mcg by mouth daily before breakfast. Administer on  Tuesday and thursdays only   levothyroxine (SYNTHROID, LEVOTHROID) 50 MCG tablet Take 50 mcg by mouth daily before breakfast. Everyday except Tuesday and thursday   losartan (COZAAR) 50 MG tablet Take 50 mg by mouth daily.    metoprolol (LOPRESSOR) 50 MG tablet Take 50 mg by mouth 2 (two) times daily.   mupirocin ointment (BACTROBAN) 2 % Place 1 application into the nose as needed.   NONFORMULARY OR COMPOUNDED ITEM See pharmacy note   nystatin-triamcinolone (MYCOLOG II) cream Apply 1 application topically 2 (two) times daily as needed.   omeprazole (PRILOSEC) 40 MG capsule Take 40 mg by mouth daily.    potassium chloride SA (K-DUR,KLOR-CON) 20 MEQ tablet Take 20 mEq by mouth daily.   predniSONE (DELTASONE) 50 MG tablet Take 1 tablet (50 mg total) by mouth daily with breakfast.   tolterodine (DETROL) 2 MG tablet Take 2 mg by mouth daily as needed (bladder control).    No facility-administered encounter medications on file as of 06/15/2022.    Social History: Social History   Tobacco Use   Smoking status: Never   Smokeless tobacco: Never  Vaping Use   Vaping Use: Never used  Substance Use Topics   Alcohol use: No   Drug use: No    Family Medical History: Family History  Problem Relation Age of Onset   Stroke Mother    Cancer Father     Exam:  There were no vitals filed for this visit.  General: A&O ROM of spine: limited due to scoliosis and patient's body habitus.  Palpation of spine: diffuse mild tenderness.  Good and equal strength throughout. Reflexes are 2+ and symmetric at the patella and achilles.   Bilateral lower extremity sensation is intact to light touch.  Ambulates with a flexed gait with the assistance of rolling walker  Imaging:  06/15/22 thoracic xrays  fracture is again able to be visualized however there  does not appear to be any malalignment.  05/18/22 thoracic xrays T11 fracture is not well visualized but there does not appear to be any significant malalignment.  CT chest 05/08/22 IMPRESSION:  Subacute right fourth through seventh rib fractures. The posterior  fifth rib fracture is mildly displaced. No pneumothorax or large  effusion. Scarring and atelectasis in the right middle lobe,  lingula, and lower lobes, with mucous plugging in the middle lobe.   Acute-subacute T11 compression fracture with 15% height loss, new  since March 2022.   Electronically Signed  By: Maurine Simmering M.D.  On: 05/08/2022 12:17    I have personally reviewed the images and agree with the above interpretation.  Assessment and Plan:  Ms. Bassford is a pleasant 88 y.o. female with T11 fracture.  She continues to have some pain but it is improved since her last visit.  She continues to take tramadol.  I did warn her against the side effects of Celebrex and would like to stop this after her current prescription.  I encouraged her to try topical medications or consider Lidoderm patches.  Given her improvement, I will see her going forward on an as-needed basis.  She expressed understanding was in agreement with this plan.  I spent a total of 26 minutes in both face-to-face and non-face-to-face activities for this visit on the date of this encounter.  This note was generated in part with voice recognition software and I apologize for any typographical errors that were not detected and corrected.  Cooper Render PA-C  Neurosurgery

## 2022-06-27 ENCOUNTER — Encounter: Payer: Medicare Other | Attending: Internal Medicine | Admitting: Internal Medicine

## 2022-06-27 DIAGNOSIS — I739 Peripheral vascular disease, unspecified: Secondary | ICD-10-CM | POA: Diagnosis not present

## 2022-06-27 DIAGNOSIS — Z7901 Long term (current) use of anticoagulants: Secondary | ICD-10-CM | POA: Insufficient documentation

## 2022-06-27 DIAGNOSIS — I872 Venous insufficiency (chronic) (peripheral): Secondary | ICD-10-CM | POA: Insufficient documentation

## 2022-06-27 DIAGNOSIS — I890 Lymphedema, not elsewhere classified: Secondary | ICD-10-CM | POA: Diagnosis not present

## 2022-06-27 DIAGNOSIS — Z950 Presence of cardiac pacemaker: Secondary | ICD-10-CM | POA: Diagnosis not present

## 2022-06-27 DIAGNOSIS — Z85828 Personal history of other malignant neoplasm of skin: Secondary | ICD-10-CM | POA: Diagnosis not present

## 2022-06-27 DIAGNOSIS — S8012XA Contusion of left lower leg, initial encounter: Secondary | ICD-10-CM | POA: Insufficient documentation

## 2022-06-27 DIAGNOSIS — L97828 Non-pressure chronic ulcer of other part of left lower leg with other specified severity: Secondary | ICD-10-CM | POA: Diagnosis not present

## 2022-06-27 DIAGNOSIS — Z9049 Acquired absence of other specified parts of digestive tract: Secondary | ICD-10-CM | POA: Insufficient documentation

## 2022-06-27 DIAGNOSIS — X58XXXA Exposure to other specified factors, initial encounter: Secondary | ICD-10-CM | POA: Diagnosis not present

## 2022-06-28 ENCOUNTER — Telehealth: Payer: Self-pay

## 2022-06-28 NOTE — Telephone Encounter (Signed)
-----   Message from Peggyann Shoals sent at 06/28/2022 11:32 AM EDT ----- Regarding: med refill Celebrex '100mg'$  1 capsule twice daily--90 day supply  [facility]

## 2022-06-28 NOTE — Progress Notes (Signed)
[name], [name] ([id number]) Visit Report for 06/27/2022 Arrival Information Details Patient Name: [name], [name]. Date of Service: 06/27/2022 10:30 AM Medical Record Number: [id number] Patient Account Number: [id number] Date of Birth/Sex: 12-13-1922 (88 y.o. F) Treating RN: Cornell Barman Primary Care Dannetta Lekas: Adrian Prows Other Clinician: Massie Kluver Referring Halsey Hammen: Adrian Prows Treating Yue Flanigan/Extender: Tito Dine in Treatment: 23 Visit Information History Since Last Visit All ordered tests and consults were completed: No Patient Arrived: [name] Added or deleted any medications: No Arrival Time: 10:34 Any new allergies or adverse reactions: No Transfer Assistance: None Had a fall or experienced change in No Patient Requires Transmission-Based No activities of daily living that may affect Precautions: risk of falls: Patient Has Alerts: Yes Hospitalized since last visit: No Patient Alerts: Patient on Blood Pain Present Now: No Thinner NOT DIABETIC ***ELIQUIS*** Electronic Signature(s) Signed: 06/28/2022 8:24:32 AM By: Massie Kluver Entered By: Massie Kluver on 06/27/2022 10:44:05 [name], [name] ([id number]) -------------------------------------------------------------------------------- Clinic Level of Care Assessment Details Patient Name: [name] Date of Service: 06/27/2022 10:30 AM Medical Record Number: [id number] Patient Account Number: [id number] Date of Birth/Sex: [birth year] (88 y.o. F) Treating RN: Cornell Barman Primary Care Starlit Raburn: Adrian Prows Other Clinician: Massie Kluver Referring Laterica Matarazzo: Adrian Prows Treating Kaleya Douse/Extender: Tito Dine in Treatment: 28 Clinic Level of Care Assessment Items TOOL 1 Quantity Score '[]'$  - Use when EandM and Procedure is performed on INITIAL visit 0 ASSESSMENTS - Nursing Assessment / Reassessment '[]'$  - General Physical Exam (combine w/ comprehensive  assessment (listed just below) when performed on new 0 pt. evals) '[]'$  - 0 Comprehensive Assessment (HX, ROS, Risk Assessments, Wounds Hx, etc.) ASSESSMENTS - Wound and Skin Assessment / Reassessment '[]'$  - Dermatologic / Skin Assessment (not related to wound area) 0 ASSESSMENTS - Ostomy and/or Continence Assessment and Care '[]'$  - Incontinence Assessment and Management 0 '[]'$  - 0 Ostomy Care Assessment and Management (repouching, etc.) PROCESS - Coordination of Care '[]'$  - Simple Patient / Family Education for ongoing care 0 '[]'$  - 0 Complex (extensive) Patient / Family Education for ongoing care '[]'$  - 0 Staff obtains Programmer, systems, Records, Test Results / Process Orders '[]'$  - 0 Staff telephones HHA, Nursing Homes / Clarify orders / etc '[]'$  - 0 Routine Transfer to another Facility (non-emergent condition) '[]'$  - 0 Routine Hospital Admission (non-emergent condition) '[]'$  - 0 New Admissions / Biomedical engineer / Ordering NPWT, Apligraf, etc. '[]'$  - 0 Emergency Hospital Admission (emergent condition) PROCESS - Special Needs '[]'$  - Pediatric / Minor Patient Management 0 '[]'$  - 0 Isolation Patient Management '[]'$  - 0 Hearing / Language / Visual special needs '[]'$  - 0 Assessment of Community assistance (transportation, D/C planning, etc.) '[]'$  - 0 Additional assistance / Altered mentation '[]'$  - 0 Support Surface(s) Assessment (bed, cushion, seat, etc.) INTERVENTIONS - Miscellaneous '[]'$  - External ear exam 0 '[]'$  - 0 Patient Transfer (multiple staff / Civil Service fast streamer / Similar devices) '[]'$  - 0 Simple Staple / Suture removal (25 or less) '[]'$  - 0 Complex Staple / Suture removal (26 or more) '[]'$  - 0 Hypo/Hyperglycemic Management (do not check if billed separately) '[]'$  - 0 Ankle / Brachial Index (ABI) - do not check if billed separately Has the patient been seen at the hospital within the last three years: Yes Total Score: 0 Level Of Care: ____ [name] ([id number]) Electronic Signature(s) Signed:  06/28/2022 8:24:32 AM By: Massie Kluver Entered By: Massie Kluver on 06/27/2022 11:53:39 [name], [name] ([id number]) -------------------------------------------------------------------------------- Compression Therapy Details Patient Name:  [name], [name] Date of Service: 06/27/2022 10:30 AM Medical Record Number: [id number] Patient Account Number: [id number] Date of Birth/Sex: 07/25/1923 (88 y.o. F) Treating RN: Cornell Barman Primary Care Adylynn Hertenstein: Adrian Prows Other Clinician: Massie Kluver Referring Samanatha Brammer: Adrian Prows Treating Daltyn Degroat/Extender: Tito Dine in Treatment: 38 Compression Therapy Performed for Wound Assessment: Wound #10 Left,Lateral Lower Leg Performed By: Clinician Cornell Barman, RN Compression Type: Three Layer Post Procedure Diagnosis Same as Pre-procedure Notes patient tolerates wrap well Electronic Signature(s) Signed: 06/28/2022 8:24:32 AM By: Massie Kluver Entered By: Massie Kluver on 06/27/2022 11:39:30 [name], [name] ([id number]) -------------------------------------------------------------------------------- Lower Extremity Assessment Details Patient Name: [name] Date of Service: 06/27/2022 10:30 AM Medical Record Number: [id number] Patient Account Number: [id number] Date of Birth/Sex: 1923/02/11 (88 y.o. F) Treating RN: Cornell Barman Primary Care Wilian Kwong: Adrian Prows Other Clinician: Massie Kluver Referring Harriet S[place]: Adrian Prows Treating Emmylou Bieker/Extender: Tito Dine in Treatment: 65 Electronic Signature(s) Signed: 06/28/2022 8:24:32 AM By: Massie Kluver Signed: 06/28/2022 4:29:23 PM By: Gretta Cool BSN, RN, CWS, Kim RN, BSN Entered By: Massie Kluver on 06/27/2022 11:38:21 [name], [name] ([id number]) -------------------------------------------------------------------------------- Multi Wound Chart Details Patient Name: [name] Date of Service: 06/27/2022 10:30  AM Medical Record Number: [id number] Patient Account Number: [id number] Date of Birth/Sex: 1923-03-12 (88 y.o. F) Treating RN: Cornell Barman Primary Care Livana Yerian: Adrian Prows Other Clinician: Massie Kluver Referring [name] Conry: Adrian Prows Treating Charlea Nardo/Extender: Tito Dine in Treatment: 39 Vital Signs Height(in): 62 Pulse(bpm): 91 Weight(lbs): 150 Blood Pressure(mmHg): 157/91 Body Mass Index(BMI): 27.4 Temperature(F): 98.3 Respiratory Rate(breaths/min): 18 Photos: [N/A:N/A] Wound Location: Left, Lateral Lower Leg N/A N/A Wounding Event: Trauma N/A N/A Primary Etiology: Trauma, Other N/A N/A Comorbid History: Cataracts, Congestive Heart Failure, N/A N/A Hypertension, Peripheral Arterial Disease, Osteoarthritis, Neuropathy Date Acquired: 06/24/2022 N/A N/A Weeks of Treatment: 0 N/A N/A Wound Status: Open N/A N/A Wound Recurrence: No N/A N/A Measurements L x W x D (cm) 1x1.5x0.2 N/A N/A Area (cm) : 1.178 N/A N/A Volume (cm) : 0.236 N/A N/A Classification: Full Thickness Without Exposed N/A N/A Support Structures Exudate Amount: Large N/A N/A Exudate Type: Serosanguineous N/A N/A Exudate Color: red, brown N/A N/A Wound Margin: Flat and Intact N/A N/A Granulation Amount: None Present (0%) N/A N/A Necrotic Amount: None Present (0%) N/A N/A Exposed Structures: Fat Layer (Subcutaneous Tissue): N/A N/A Yes Fascia: No Tendon: No Muscle: No Joint: No Bone: No Epithelialization: None N/A N/A Treatment Notes [name], [name] ([id number]) Electronic Signature(s) Signed: 06/28/2022 8:24:32 AM By: Massie Kluver Entered By: Massie Kluver on 06/27/2022 11:38:57 Blansett, [name] ([id number]) -------------------------------------------------------------------------------- Multi-Disciplinary Care Plan Details Patient Name: [name] Date of Service: 06/27/2022 10:30 AM Medical Record Number: [id number] Patient Account Number:  [id number] Date of Birth/Sex: 09-Nov-1923 (88 y.o. F) Treating RN: Cornell Barman Primary Care Darrol Brandenburg: Adrian Prows Other Clinician: Massie Kluver Referring Jerika Wales: Adrian Prows Treating Eri Platten/Extender: Tito Dine in Treatment: 31 Active Inactive Electronic Signature(s) Signed: 06/28/2022 8:24:32 AM By: Massie Kluver Signed: 06/28/2022 4:29:23 PM By: Gretta Cool BSN, RN, CWS, Kim RN, BSN Entered By: Massie Kluver on 06/27/2022 11:38:46 Dessert, [name] ([id number]) -------------------------------------------------------------------------------- Pain Assessment Details Patient Name: [name] Date of Service: 06/27/2022 10:30 AM Medical Record Number: [id number] Patient Account Number: [id number] Date of Birth/Sex: 01-19-1923 (88 y.o. F) Treating RN: Cornell Barman Primary Care Keisa Blow: Adrian Prows Other Clinician: Massie Kluver Referring Mareta Chesnut: Adrian Prows Treating Samarrah Tranchina/Extender: Tito Dine in Treatment: 70 Active Problems Location of Pain Severity and Description of Pain  Patient Has Paino No Site Locations Pain Management and Medication Current Pain Management: Electronic Signature(s) Signed: 06/28/2022 8:24:32 AM By: Massie Kluver Signed: 06/28/2022 4:29:23 PM By: Gretta Cool, BSN, RN, CWS, Kim RN, BSN Entered By: Massie Kluver on 06/27/2022 10:46:27 Goller, [name] ([id number]) -------------------------------------------------------------------------------- Wound Assessment Details Patient Name: [name] Date of Service: 06/27/2022 10:30 AM Medical Record Number: [id number] Patient Account Number: [id number] Date of Birth/Sex: [birth year] (88 y.o. F) Treating RN: Cornell Barman Primary Care Jax Abdelrahman: Adrian Prows Other Clinician: Massie Kluver Referring Tommie Dejoseph: Adrian Prows Treating Muhanad Torosyan/Extender: Tito Dine in Treatment: 28 Wound Status Wound Number: 10 Primary Trauma,  Other Etiology: Wound Location: Left, Lateral Lower Leg Wound Open Wounding Event: Trauma Status: Date Acquired: 06/24/2022 Comorbid Cataracts, Congestive Heart Failure, Hypertension, Weeks Of Treatment: 0 History: Peripheral Arterial Disease, Osteoarthritis, Neuropathy Clustered Wound: No Photos Wound Measurements Length: (cm) 1 Width: (cm) 1.5 Depth: (cm) 0.2 Area: (cm) 1.178 Volume: (cm) 0.236 % Reduction in Area: % Reduction in Volume: Epithelialization: None Tunneling: No Undermining: No Wound Description Classification: Full Thickness Without Exposed Support Structures Wound Margin: Flat and Intact Exudate Amount: Large Exudate Type: Serosanguineous Exudate Color: red, brown Foul Odor After Cleansing: No Slough/Fibrino No Wound Bed Granulation Amount: None Present (0%) Exposed Structure Necrotic Amount: None Present (0%) Fascia Exposed: No Fat Layer (Subcutaneous Tissue) Exposed: Yes Tendon Exposed: No Muscle Exposed: No Joint Exposed: No Bone Exposed: No Electronic Signature(s) Signed: 06/28/2022 8:24:32 AM By: Massie Kluver Signed: 06/28/2022 4:29:23 PM By: Gretta Cool, BSN, RN, CWS, Kim RN, BSN Entered By: Massie Kluver on 06/27/2022 11:21:55 Jindra, [name] ([id number]) -------------------------------------------------------------------------------- [place] Details Patient Name: [name] Date of Service: 06/27/2022 10:30 AM Medical Record Number: [id number] Patient Account Number: [id number] Date of Birth/Sex: Oct 22, 1923 (88 y.o. F) Treating RN: Cornell Barman Primary Care Livio Ledwith: Adrian Prows Other Clinician: Massie Kluver Referring Christasia Angeletti: Adrian Prows Treating Genea Rheaume/Extender: Tito Dine in Treatment: 93 Vital Signs Time Taken: 10:46 Temperature (F): 98.3 Height (in): 62 Pulse (bpm): 91 Weight (lbs): 150 Respiratory Rate (breaths/min): 18 Body Mass Index (BMI): 27.4 Blood Pressure (mmHg): 157/91 Reference  Range: 80 - 120 mg / dl Electronic Signature(s) Signed: 06/28/2022 8:24:32 AM By: Massie Kluver Entered By: Massie Kluver on 06/27/2022 10:46:23

## 2022-06-28 NOTE — Telephone Encounter (Signed)
I spoke with the patient daughter and notified her of this. She is aware of the prior discussion and stated that she hasn't been requesting this. She will talk with the PCP if she feels like [name] is struggling without the celebrex.

## 2022-06-29 NOTE — Progress Notes (Signed)
XITLALIC, MASLIN ([id number]) Visit Report for 06/27/2022 Debridement Details Patient Name: [name], [name]. Date of Service: 06/27/2022 10:30 AM Medical Record Number: [id number] Patient Account Number: [id number] Date of Birth/Sex: August 22, 1923 (88 y.o. F) Treating RN: Cornell Barman Primary Care Provider: Adrian Prows Other Clinician: Massie Kluver Referring Provider: Adrian Prows Treating Provider/Extender: Tito Dine in Treatment: 27 Debridement Performed for Wound #10 Left,Lateral Lower Leg Assessment: Performed By: Physician Ricard Dillon, MD Debridement Type: Debridement Level of Consciousness (Pre- Awake and Alert procedure): Pre-procedure Verification/Time Out Yes - 11:15 Taken: Start Time: 11:15 Total Area Debrided (L x W): 1 (cm) x 1.5 (cm) = 1.5 (cm) Tissue and other material Viable, Non-Viable, Eschar, Subcutaneous debrided: Level: Skin/Subcutaneous Tissue Debridement Description: Excisional Instrument: Curette Bleeding: Minimum Hemostasis Achieved: Pressure End Time: 11:20 Response to Treatment: Procedure was tolerated well Level of Consciousness (Post- Awake and Alert procedure): Post Debridement Measurements of Total Wound Length: (cm) 1 Width: (cm) 1.5 Depth: (cm) 0.2 Volume: (cm) 0.236 Character of Wound/Ulcer Post Debridement: Stable Post Procedure Diagnosis Same as Pre-procedure Electronic Signature(s) Signed: 06/27/2022 4:13:45 PM By: Linton Ham MD Signed: 06/28/2022 8:24:32 AM By: Massie Kluver Signed: 06/28/2022 4:29:23 PM By: Gretta Cool, BSN, RN, CWS, Kim RN, BSN Entered By: Massie Kluver on 06/27/2022 11:42:35 Kashani, [name] ([id number]) -------------------------------------------------------------------------------- HPI Details Patient Name: [name] Date of Service: 06/27/2022 10:30 AM Medical Record Number: [id number] Patient Account Number: [id number] Date of Birth/Sex: 30-Aug-1923 (88 y.o.  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 Dr. Amalia Hailey the podiatrist saw her on 07/20/2017 and sent her to the wound center for possible hyperbaric oxygen therapy. past medical history of peripheral vascular disease, varicose veins, status post appendectomy, basal cell carcinoma excision from the left leg, cholecystectomy, pacemaker placement, right lower extremity angiography done by Dr. dew in March 2017 with placement of a stent. there is also note of a successful ablation of the right small saphenous vein done which was reviewed by ultrasound on 10/24/2016. the patient had a right small saphenous vein ablation done on 10/20/2016. The patient has never been a smoker. She has been seen by Dr. Corene Cornea dew the vascular surgeon who most recently saw her on 06/15/2017 for evaluation of ongoing problems with right leg swelling. She had a lower extremity arterial duplex examination done(02/13/17) which showed patent distal right superficial femoral artery stent and above-the-knee popliteal stent without evidence of restenosis. The ABI was more than 1.3 on the right and more than 1.3 on the left. This was consistent with noncompressible arteries due to medial calcification. The right great toe pressure and PPG waveforms are within normal limits and the left great toe pressure and PPG waveforms are decreased. he recommended she continue to wear her compression stockings and continue with elevation. She is scheduled to have a noninvasive arterial study in the near future 08/16/2017 -- had a lower extremity  arterial duplex examination done which showed patent distal right superficial femoral artery stent and above-the-knee popliteal stent without evidence of restenosis. The ABI was more than 1.3 on the right and more than 1.3 on the left. This was consistent with noncompressible arteries due to medial calcification. The right great toe  pressure and PPG waveforms are within normal limits and the left great toe pressure and PPG waveforms are decreased. the x-ray of the right ankle has not yet been done 08/24/2017 -- had a right ankle x-ray -- IMPRESSION:1. No fracture, bone lesion or evidence of osteomyelitis. 2. Lateral soft tissue swelling with a soft tissue ulcer. she has not yet seen the vascular surgeon for review 08/31/17 on evaluation today patient's wound appears to be showing signs of improvement. She still with her appointment with vascular in order to review her results of her vascular study and then determine if any intervention would be recommended at that time. No fevers, chills, nausea, or vomiting noted at this time. She has been tolerating the dressing changes without complication. 09/28/17 on evaluation today patient's wound appears to show signs of good improvement in regard to the granulation tissue which is surfacing. There is still a layer of slough covering the wound and the posterior portion is still significantly deeper than the anterior nonetheless there has been some good sign of things moving towards the better. She is going to go back to Dr. dew for reevaluation to ensure her blood flow is still appropriate. That will be before her next evaluation with [place] next week. No fevers, chills, nausea, or vomiting noted at this time. Patient does have some discomfort rated to be a 3-4/10 depending on activity specifically cleansing the wound makes it worse. 10/05/2017 -- the patient was seen by Dr. Lucky Cowboy last week and noninvasive studies showed a normal right ABI with brisk triphasic  waveforms consistent with no arterial insufficiency including normal digital pressures. The duplex showed a patent distal right SFA stent and the proximal SFA was also normal. He was pleased with her test and thought she should have enough of perfusion for normal wound healing. He would see her back in 6 months time. 12/21/17 on evaluation today patient appears to be doing fairly well in regard to her right lateral ankle wound. Unfortunately the main issue that she is expansion at this point is that she is having some issues with what appears to be some cellulitis in the right anterior shin. She has also been noting a little bit of uncomfortable feeling especially last night and her ankle area. I'm afraid that she made the developing a little bit of an infection. With that being said I think it is in the early stages. 12/28/17 on evaluation today patient's ankle appears to be doing excellent. She's making good progress at this point the cellulitis seems to have improved after last week's evaluation. Overall she is having no significant discomfort which is excellent news. She does have an appointment with Dr. dew on March 29, 2018 for reevaluation in regard to the stent he placed. She seems to have excellent blood flow in the right lower extremity. 01/19/12 on evaluation today patient's wound appears to be doing very well. In fact she does not appear to require debridement at this point, there's no evidence of infection, and overall from the standpoint of the wound she seems to be doing very well. With that being said I believe that it may be time to switch to different dressing away from the [facility] Dressing she tells me she does have a lot going on her friend actually passed away yesterday and she's also having a lot of issues with her husband this obviously is weighing heavy on her as far as your thoughts and concerns today. 01/25/18 on evaluation today patient appears to be  doing fairly well in  regard to her right lateral malleolus. She has been tolerating the dressing changes without complication. Overall I feel like this is definitely showing signs of improvement as far as how the overall appearance of the wound is there's also evidence of epithelium start to migrate over the granulation tissue. In general I think that she is progressing nicely as far as the wound is concerned. The only concern she really has is whether or not we can switch to every other week visits in order to avoid having as many appointments as her daughters have a difficult time getting her to her appointments as well as the patient's husband to his he is not doing very well at this point. 02/22/18 on evaluation today patient's right lateral malleolus ulcer appears to be doing great. She has been tolerating the dressing changes without complication. Overall you making excellent progress at this time. Patient is having no significant discomfort. [name], [name] ([id number]) 03/15/18 on evaluation today patient appears to be doing much more poorly in regard to her right lateral ankle ulcer at this point. Unfortunately since have last seen her her husband has passed just a few days ago is obviously weighed heavily on her her daughter also had surgery well she is with her today as usual. There does not appear to be any evidence of infection she does seem to have significant contusion/deep tissue injury to the right lateral malleolus which was not noted previous when I saw her last. It's hard to tell of exactly when this injury occurred although during the time she was spending the night in the hospital this may have been most likely. 03/22/18 on evaluation today patient appears to actually be doing very well in regard to her ulcer. She did unfortunately have a setback which was noted last week however the good news is we seem to be getting back on track and in fact the wound in the core did still have some  necrotic tissue which will be addressed at this point today but in general I'm seeing signs that things are on the up and up. She is glad to hear this obviously she's been somewhat concerned that due to the how her wound digressed more recently. 03/29/18 on evaluation today patient appears to be doing fairly well in regard to her right lower extremity lateral malleolus ulcer. She unfortunately does have a new area of pressure injury over the inferior portion where the wound has opened up a little bit larger secondary to the pressure she seems to be getting. She does tell me sometimes when she sleeps at night that it actually hurts and does seem to be pushing on the area little bit more unfortunately. There does not appear to be any evidence of infection which is good news. She has been tolerating the dressing changes without complication. She also did have some bruising in the left second and third toes due to the fact that she may have bump this or injured it although she has neuropathy so she does not feel she did move recently that may have been where this came from. Nonetheless there does not appear to be any evidence of infection at this time. 04/12/18 on evaluation today patient's wound on the right lateral ankle actually appears to be doing a little bit better with a lot of necrotic docking tissue centrally loosening up in clearing away. However she does have the beginnings of a deep tissue injury on the left lateral malleolus likely due  to the fact we've been trying offload the right as much as we have. I think she may benefit from an assistive soft device to help with offloading and it looks like they're looking at one of the doughnut conditions that wraps around the lower leg to offload which I think will definitely do a good job. With that being said I think we definitely need to address this issue on the left before it becomes a wound. Patient is not having significant pain. 04/19/18 on  evaluation today patient appears to be doing excellent in regard to the progress she's made with her right lateral ankle ulcer. The left ankle region which did show evidence of a deep tissue injury seems to be resolving there's little fluid noted underneath and a blister there's nothing open at this point in time overall I feel like this is progressing nicely which is good news. She does not seem to be having significant discomfort at this point which is also good news. 04/25/18-She is here in follow up evaluation for bilateral lateral malleolar ulcers. The right lateral malleolus ulcer with pale subcutaneous tissue exposure, central area of ulcer with tendon/periosteum exposed. The left lateral malleolus ulcer now with central area of nonviable tissue, otherwise deep tissue injury. She is wearing compression wraps to the left lower extremity, she will place the right lower extremity compression wraps on when she gets home. She will be out of town over the weekend and return next week and follow-up appointment. She completed her doxycycline this morning 05/03/18 on evaluation today patient appears to be doing very well in regard to her right lateral ankle ulcer in general. At least she's showing some signs of improvement in this regard. Unfortunately she has some additional injury to the left lateral malleolus region which appears to be new likely even over the past several days. Again this determination is based on the overall appearance. With that being said the patient is obviously frustrated about this currently. 05/10/18-She is here in follow-up evaluation for bilateral lateral malleolar ulcers. She states she has purchased offloading shoes/boots and they will arrive tomorrow. She was asked to bring them in the office at next week's appointment so her provider is aware of product being utilized. She continues to sleep on right or left side, she has been encouraged to sleep on her back. The right  lateral malleolus ulcer is precariously close to peri-osteum; will order xray. The left lateral malleolus ulcer is improved. Will switch back to santyl; she will follow up next week. 05/17/18 on evaluation today patient actually appears to be doing very well in regard to her malleolus her ulcers compared to last time I saw them. She does not seem to have as much in the way of contusion at this point which is great news. With that being said she does continue to have discomfort and I do believe that she is still continuing to benefit from the offloading/pressure reducing boots that were recommended. I think this is the key to trying to get this to heal up completely. 05/24/18 on evaluation today patient actually appears to be doing worse at this point in time unfortunately compared to her last week's evaluation. She is having really no increased pain which is good news unfortunately she does have more maceration in your theme and noted surrounding the right lateral ankle the left lateral ankle is not really is erythematous I do not see signs of the overt cellulitis on that side. Unfortunately the wounds do not seem  to have shown any signs of improvement since the last evaluation. She also has significant swelling especially on the right compared to previous some of this may be due to infection however also think that she may be served better while she has these wounds by compression wrapping versus continuing to use the Juxta-Lite for the time being. Especially with the amount of drainage that she is experiencing at this point. No fevers, chills, nausea, or vomiting noted at this time. 05/31/18 on evaluation today patient appears to actually be doing better in regard to her right lateral lower extremity ulcer specifically on the malleolus region. She has been tolerating the antibiotic without complication. With that being said she still continues to have issues but a little bit of redness although nothing  like she what she was experiencing previous. She still continues to pressure to her ankle area she did get the problem on offloading boots unfortunately she will not wear them she states there too uncomfortable and she can't get in and out of the bed. Nonetheless at this point her wounds seem to be continually getting worse which is not what we want I'm getting somewhat concerned about her progress and how things are going to proceed if we do not intervene in some way shape or form. I therefore had a very lengthy conversation today about offloading yet again and even made a specific suggestion for switching her to a memory foam mattress and even gave the information for a specific one that they could look at getting if it was something that they were interested in considering. She does not want to be considered for a hospital bed air mattress although honestly insurance would not cover it that she does not have any wounds on her trunk. 06/14/18 on evaluation today both wounds over the bilateral lateral malleolus her ulcers appear to be doing better there's no evidence of pressure injury at this point. She did get the foam mattress for her bed and this does seem to have been extremely beneficial for her in my pinion. Her daughter states that she is having difficulty getting out of bed because of how soft it is. The patient also relates this to be. Nonetheless I do feel like she's actually doing better. Unfortunately right after and around the time she was getting the mattress she also sustained a fall when she got up to go pick up the phone and ended up injuring her right elbow she has [address] sut[address] place. We are not caring for this currently although home health is going to be taking the sutures out shortly. Nonetheless this may be something that we need to evaluate going forward. It depends on how well it has or has not healed in the end. She also recently saw an orthopedic specialist for an injection  in the right shoulder just before her fall unfortunately the fall seems to have worsened her pain. 06/21/18 on evaluation today patient appears to be doing about the same in regard to her lateral malleolus ulcers. Both appear to be just a little bit deeper but again we are clinging away the necrotic and dead tissue which I think is why this is progressing towards a deeper realm as opposed [name], MEDAL. ([id number]) to improving from my measurement standpoint in that regard. Nonetheless she has been tolerating the dressing changes she absolutely hates the memory foam mattress topper that was obtained for her nonetheless I do believe this is still doing excellent as far as taking care  of excess pressure in regard to the lateral malleolus regions. She in fact has no pressure injury that I see whereas in weeks past it was week by week I was constantly seeing new pressure injuries. Overall I think it has been very beneficial for her. 07/03/18; patient arrives in my clinic today. She has deep punched out areas over her bilateral lateral malleoli. The area on the right has some more depth. We spent a lot of time today talking about pressure relief for these areas. This started when her daughter asked for a prescription for a memory foam mattress. I have never written a prescription for a mattress and I don't think insurances would pay for that on an ordinary bed. In any case he came up that she has foam boots that she refuses to wear. I would suggest going to these before any other offloading issues when she is in bed. They say she is meticulous about offloading this the rest of the day 07/10/18- She is seen in follow-up evaluation for bilateral, lateral malleolus ulcers. There is no improvement in the ulcers. She has purchased and is sleeping on a memory foam mattress/overlay, she has been using the offloading boots nightly over the past week. She has a follow up appointment with vascular medicine at the  end of October, in my opinion this follow up should be expedited given her deterioration and suboptimal TBI results. We will order plain film xray of the left ankle as deeper structures are palpable; would consider having MRI, regardless of xray report(s). The ulcers will be treated with iodoflex/iodosorb, she is unable to safely change the dressings daily with santyl. 07/19/18 on evaluation today patient appears to be doing in general visually well in regard to her bilateral lateral malleolus ulcers. She has been tolerating the dressing changes without complication which is good news. With that being said we did have an x-ray performed on 07/12/18 which revealed a slight loosen see in the lateral portion of the distal left fibula which may represent artifact but underline lytic destruction or osteomyelitis could not be excluded. MRI was recommended. With that being said we can see about getting the patient scheduled for an MRI to further evaluate this area. In fact we have that scheduled currently for August 20 19,019. 07/26/18 on evaluation today patient's wound on the right lateral ankle actually appears to be doing fairly well at this point in my pinion. She has made some good progress currently. With that being said unfortunately in regard to the left lateral ankle ulcer this seems to be a little bit more problematic at this time. In fact as I further evaluated the situation she actually had bone exposed which is the first time that's been the case in the bone appear to be necrotic. Currently I did review patient's note from Dr. Bunnie Domino office with [facility]. He stated that ABI was 1.26 on the right and 0.95 on the left with good waveforms. Her perfusion is stable not reduced from previous studies and her digital waveforms were pretty good particularly on the right. His conclusion upon review of the note was that there was not much she could do to improve her perfusion and he  felt she was adequate for wound healing. His suggestion was that she continued to see [place] and consider a synthetic skin graft if there was no underlying infection. He plans to see her back in six months or as needed. 08/01/18 on evaluation today patient appears to be  doing better in regard to her right lateral ankle ulcer. Her left lateral ankle ulcer is about the same she still has bone involvement in evidence of necrosis. There does not appear to be evidence of infection at this time On the right lateral lower extremity. I have started her on the Augmentin she picked this up and started this yesterday. This is to get her through until she sees infectious disease which is scheduled for 08/12/18. 08/06/18 on evaluation today patient appears to be doing rather well considering my discussion with patient's daughter at the end of last week. The area which was marked where she had erythema seems to be improved and this is good news. With that being said overall the patient seems to be making good improvement when it comes to the overall appearance of the right lateral ankle ulcer although this has been slow she at least is coming around in this regard. Unfortunately in regard to the left lateral ankle ulcer this is osteomyelitis based on the pathology report as well is bone culture. Nonetheless we are still waiting CT scan. Unfortunately the MRI we originally ordered cannot be performed as the patient is a pacemaker which I had overlooked. Nonetheless we are working on the CT scan approval and scheduling as of now. She did go to the hospital over the weekend and was placed on IV Cefzo for a couple of days. Fortunately this seems to have improved the erythema quite significantly which is good news. There does not appear to be any evidence of worsening infection at this time. She did have some bleeding after the last debridement therefore I did not perform any sharp debridement in regard to left lateral ankle at  this point. Patient has been approved for a snap vac for the right lateral ankle. 08/14/18; the patient with wounds over her bilateral lateral malleoli. The area on the right actually looks quite good. Been using a snap back on this area. Healthy granulation and appears to be filling in. Unfortunately the area on the left is really problematic. 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 Also noted to have cellulitis. She saw Dr. Novella Olive of infectious disease today and was put on linezolid. We are able to verify this with her pharmacy. She is completed the Augmentin that she was already on. We've been using Iodoflex to this area 08/23/18 on evaluation today patient's wounds both actually appear to be doing better compared to my prior evaluations. Fortunately she showing signs of good improvement in regard to the overall wound status especially where were using the snap vac on the right. In regard to left lateral malleolus the wound bed actually appears to be much cleaner than previously noted. I do not feel any phone directly probed during evaluation today and though there is tendon noted this does not appear to be necrotic it's actually fairly good as far as the overall appearance of the tendon is concerned. In general the wound bed actually appears to be doing significantly better than it was previous. Patient is currently in the care of [name] and I did review that note today. He actually has her on two weeks of linezolid and then following the patient will be on 1-2 months of Keflex. That is the plan currently. She has been on antibiotics therapy as prescribed by myself initially starting on July 30, 2018 and has been on that continuously up to this point.  08/30/18 on evaluation today patient actually appears to be doing much better in regard to her right lateral malleolus ulcer. She has been tolerating the dressing  changes specifically the snap vac without complication although she did have some issues with the seal currently. Apparently there was some trouble with getting it to maintain over the past week past Sunday. Nonetheless overall the wound appears better in regard to the right lateral malleolus region. In regard to left lateral malleolus this actually show some signs of additional granulation although there still tendon noted in the base of the wound this appears to be healthy not necrotic in any way whatsoever. We are considering potentially using a snap vac for the left lateral malleolus as well the product wrap from [employer], [place], was present in the clinic today we're going to see this patient I did have her come in with me after obtaining consent from the patient and her daughter in order to look at the wound and see if there's any recommendation one way or another as to whether or not they felt the snapback could be beneficial for the left lateral malleolus region. But the conclusion was that it might be but that this is definitely a little bit deeper wound than what traditionally would be utilized for a snap vac. 09/06/18 on evaluation today patient actually appears to be doing excellent in my pinion in regard to both ankle ulcers. She has been tolerating the dressing changes without complication which is great news. Specifically we have been using the snap vac. In regard to the right ankle I'm not even sure that this is going to be necessary for today and following as the wound has filled in quite nicely. In regard to the left ankle I do believe [name], [name] ([id number]) that we're seeing excellent epithelialization from the edge as well as granulation in the central portion the tendon is still exposed but there's no evidence of necrotic bone and in general I feel like the patient has made excellent progress even compared to last week with just one week of the snap vac. 09/11/18; this is a  patient who has wounds on her bilateral lateral malleoli. Initially both of these were deep stage IV wounds in the setting of chronic arterial insufficiency. She has been revascularized. As I understand think she been using snap vacs to both of these wounds however the area on the right became more superficial and currently she is only using it on the left. Using silver collagen on the right and silver collagen under the back on the left I believe 09/19/18 on evaluation today patient actually appears to be doing very well in regard to her lateral malleolus or ulcers bilaterally. She has been tolerating the dressing changes without complication. Fortunately there does not appear to be any evidence of infection at this time. Overall I feel like she is improving in an excellent manner and I'm very pleased with the fact that everything seems to be turning towards the better for her. This has obviously been a long road. 09/27/18 on evaluation today patient actually appears to be doing very well in regard to her bilateral lateral malleolus ulcers. She has been tolerating the dressing changes without complication. Fortunately there does not appear to be any evidence of infection at this time which is also great news. No fevers, chills, nausea, or vomiting noted at this time. Overall I feel like she is doing excellent with the snap vac on the left malleolus. She had 40  mL of fluid collection over the past week. 10/04/18 on evaluation today patient actually appears to be doing well in regard to her bilateral lateral malleolus ulcers. She continues to tolerate the dressing changes without complication. One issue that I see is the snap vac on the left lateral malleolus which appears to have sealed off some fluid underlying this area and has not really allowed it to heal to the degree that I would like to see. For that reason I did suggest at this point we may want to pack a small piece of packing strip into this  region to allow it to more effectively wick out fluid. 10/11/18 in general the patient today does not feel that she has been doing very well. She's been a little bit lethargic and subsequently is having bodyaches as well according to what she tells me today. With that being said overall she has been concerned with the fact that something may be worsening although to be honest her wounds really have not been appearing poorly. She does have a new ulcer on her left heel unfortunately. This may be pressure related. Nonetheless it seems to me to have potentially started at least as a blister I do not see any evidence of deep tissue injury. In regard to the left ankle the snap vac still seems to be causing the ceiling off of the deeper part of the wound which is in turn trapping fluid. I'm not extremely pleased with the overall appearance as far as progress from last week to this week therefore I'm gonna discontinue the snap vac at this point. 10/18/18 patient unfortunately this point has not been feeling well for the past several days. She was seen by Grayland Ormond her primary care provider who is a Librarian, academic at [facility]. Subsequently she states that she's been very weak and generally feeling malaise. No fevers, chills, nausea, or vomiting noted at this time. With that being said bloodwork was performed at the PCP office on the 11th of this month which showed a white blood cell count of 10.7. This was repeated today and shows a white blood cell count of 12.4. This does show signs of worsening. Coupled with the fact that she is feeling worse and that her left ankle wound is not really showing signs of improvement I feel like this is an indication that the osteomyelitis is likely exacerbating not improving. Overall I think we may also want to check her C-reactive protein and sedimentation rate. Actually did call [facility] office this afternoon while the patient was in the office here  with me. Subsequently based on the findings we discussed treatment possibilities and I think that it is appropriate for [place] to go ahead and initiate treatment with doxycycline which I'm going to do. Subsequently he did agree to see about adding a CRP and sedimentation rate to her orders. If that has not already been drawn to where they can run it they will contact the patient she can come back to have that check. They are in agreement with plan as far as the patient and her daughter are concerned. Nonetheless also think we need to get in touch with Dr. Henreitta Leber office to see about getting the patient scheduled with him as soon as possible. 11/08/18 on evaluation today patient presents for follow-up concerning her bilateral foot and ankle ulcers. I did do an extensive review of her chart in epic today. Subsequently she was seen by [name] he did initiate Cefepime IV antibiotic  therapy. Subsequently she had some issues with her PICC line this had to be removed because it was coiled and then replaced. Fortunately that was now settled. Unfortunately she has continued have issues with her left heel as well as the issues that she is experiencing with her bilateral lateral malleolus regions. I do believe however both areas seem to be doing a little bit better on evaluation today which is good news. No fevers, chills, nausea, or vomiting noted at this time. She actually has an angiogram schedule with Dr. dew on this coming Monday, November 11, 2018. Subsequently the patient states that she is feeling much better especially than what she was roughly 2 weeks ago. She actually had to cancel an appointment because she was feeling so poorly. No fevers, chills, nausea, or vomiting noted at this time. 11/15/18 on evaluation today patient actually is status post having had her angiogram with Dr. dew Monday, four days ago. 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 He had to go and work on  several areas of the vasculature fortunately he was able to obtain no more than a 30% residual stenosis throughout post procedure. I reviewed this note today. I think this will definitely help with healing at this time. Fortunately there does not appear to be any signs of infection and I do feel like ratio already has a better appearance to it. 11/22/18 upon evaluation today patient actually appears to be doing very well in regard to her wounds in general. The right lateral malleolus looks excellent the heel looks better in the left lateral malleolus also appears to be doing a little better. With that being said the right second toe actually appears to be open and training we been watching this is been dry and stable but now is open. 12/03/2018 Seen today for follow-up and management of multiple bilateral lower extremity wounds. New pressure injury of the great toe which is closed at this time. Wound of the right distal second toe appears larger today with deep undermining and a pocket of fluid present within the undermining region. Left and right malleolus is wounds are stable today with no signs and symptoms of infection.Denies any needs or concerns during exam today. 12/13/18 on evaluation today patient appears to be doing somewhat better in regard to her left heel ulcer. She also seems to be completely healed in regard to the right lateral malleolus ulcer. The left malleolus ulcer is smaller what unfortunately the wounds which are new over the first and second toes of the right foot are what are most concerning at this point especially the second. Both areas did require sharp debridement today. 12/20/18 on evaluation today patient's wound actually appears to be doing better in regard to left lateral ankle and her right lateral ankle continues to remain healed. The hill ulcer on the left is improved. She does have improvement noted as well in regard to both toe ulcers. Overall I'm very pleased in  this regard. No fevers, chills, nausea, or vomiting noted at this time. 12/23/18 on evaluation today patient is seen after she had her toenails trimmed at the podiatrist office due to issues with her right great toe. There was what appeared to be dark eschar on the surface of the wound which had her in the podiatrist concerned. Nonetheless as I remember that during the last office visit I had utilize silver nitrate of this area I was much less concerned about the situation. Subsequently I  was able to clean off much of this tissue without any complication today. This does not appear to show any signs of infection and actually look somewhat better Etsitty, [name] ([id number]) compared to last time post debridement. Her second toe on the right foot actually had callous over and there did appear still be some fluid underneath this that would require debridement today. 12/27/18 on evaluation today patient actually appears to be showing signs of improvement at all locations. Even the left lateral ankle although this is not quite as great as the other sites. Fortunately there does not appear to be any signs of infection at this time and both of her toes on the right foot seem to be showing signs of improvement which is good news and very pleased in this regard. 01/03/19 on evaluation today patient appears to be doing better for the most part in regard to her wounds in particular. There does not appear to be any evidence of infection at this time which is good news. Fortunately there is no sign of really worsening anywhere except for the right great toe which she does have what appears to be a bruise/deep tissue injury which is very superficial and already resolving. I'm not sure where this came from I questioned her extensively and she does not recall what may have happened with this. Other than that the patient seems to be doing well even the left lateral ankle ulcer looks good and is getting  smaller. 01/10/19 on evaluation today patient appears to be doing well in regard to her left heel wound and both of her toe wounds. Overall I feel like there is definitely improvement here and I'm happy in that regard. With that being said unfortunately she is having issues with the left lateral malleolus ulcer which unfortunately still has a lot of depth to it. This is gonna be a very difficult wound for [place] to be able to truly get to heal. I may want to consider some type of skin substitute to see if this would be of benefit for her. I'll discuss this with her more the next visit most likely. This was something I thought about more at the end of the visit when I was Artie out of the room and the patient had been discharged. 01/17/19 on evaluation today patient appears to be doing very well in regard to her wounds in general. She's been making excellent progress at this time. Fortunately there's no sign of infection at this time either. No fevers, chills, nausea, or vomiting noted at this time. The biggest issue is still her left lateral malleolus where it appears to be doing well and is getting smaller but still shows a small corner where this is deeper and goes down into what appears to be the joint space. Nonetheless this is taking much longer to heal although it still looks better in smaller than previous evaluations. 01/24/19 on evaluation today patient's wounds actually appear to be doing rather well in general overall. She did require some sharp debridement in regard to the right great toe but everything else appears to be doing excellent no debridement was even necessary. No fevers, chills, nausea, or vomiting noted at this time. 01/31/19 on evaluation today patient actually appears to be doing much better in regard to her left foot wound on the heel as well as the ankle. The right great toe appears to be a little bit worse today this had callous over and trapped a lot of fluid underneath.  Fortunately there's no signs of infection at any site which is great news. 02/07/19 on evaluation today patient actually appears to be doing decently well in regard to all of her ulcers at this point. No sharp debridement was required she is a little bit of hyper granulation in regard to the left lateral ankle as well as the left heel but the hill itself is almost completely healed which is excellent news. Overall been very pleased in this regard. 02/14/19 on evaluation today patient actually appears to be doing very well in regard to her ulcers on the right first toe, left lateral malleolus, and left heel. In fact the heel is almost completely healed at this point. The patient does not show any signs of infection which is good news. Overall very pleased with how things have progressed. 04/18/19 Telehealth Evaluation During the COVID-19 National Emergency: Verbal Consent: Obtained from patient Allergies: reviewed and the active list is current. Medication changes: patient has no current medication changes. COVID-19 Screening: 1. Have you traveled internationally or on a cruise ship in the last 14 dayso No 2. Have you had contact with someone with or under investigation for COVID-19o No 3. Have you had a fever, cough, sore throat, or experiencing shortness of breatho No on evaluation today actually did have a visit with this patient through a telehealth encounter with her home health nurse. Subsequently it was noted that the patient actually appears to be doing okay in regard to her wounds both the right great toe as well as the left lateral malleolus have shown signs of improvement although this in your theme around the left lateral malleolus there eschar coverings for both locations. The question is whether or not they are actually close and whether or not home health needs to discharge the patient or not. Nonetheless my concern is this point obviously is that without actually seeing her and being  able to evaluate this directly I cannot ensure that she is completely healed which is the question that I'm being asked. 04/22/19 on evaluation today patient presents for her first evaluation since last time I saw her which was actually February 14, 2019. I did do a telehealth visit last week in which point it was questionable whether or not she may be healed and had to bring her in today for confirmation. With that being said she does seem to be doing quite well at this point which is good news. There does not appear to be any drainage in the deed I believe her wounds may be healed. Readmission: 09/04/2019 on evaluation today patient appears to be doing unfortunately somewhat more poorly in regard to her left foot ulcer secondary to a wound that began on 08/21/2019 at least when she first noticed this. Fortunately she has not had any evidence of active infection at this time. Systemically. I also do not necessarily see any evidence of infection at the blister/wound site on the first metatarsal head plantar aspect. This almost appears to be something that may have just rubbed inappropriately causing this to breakdown. They did not want a wait too long to come in to be seen as again she had significant issues in the past with wounds that took quite a while to heal in fact it was close to 2 years. Nonetheless this does not appear to be quite that bad but again we do need to remove some of the necrotic tissue from the surface of the wound to tell exactly the extent. She does not appear  to have any significant arterial disease at this point and again her last ABIs and TBI's are recorded above in the alert section her left ABI was 1.27 with a TBI of 0.72 to the right ABI 1.08 with a TBI of 0.39. Other than this the patient has been doing quite well since I last saw her and that was in May 2020. 09/11/2019 on evaluation today patient appeared to be doing very well with regard to her plantar foot ulcer on the  left. In fact this appears to be almost completely healed which is awesome. That is after just 1 week of intervention. With that being said there is no signs of active infection at this time. [name], [name] ([id number]) 09/18/2019 on evaluation today patient actually appears to be doing excellent in fact she is completely healed based on what I am seeing at this point. Fortunately there is no signs of active infection at this time and overall patient is very pleased to hear that this area has healed so quickly. Readmission: 05/13/2021 upon evaluation today this patient presents for reevaluation here in the clinic. This is a wound that actually we previously took care of. She had 1 on the right ankle and the left the left turned out to be be harder due to to heal but nonetheless is doing great at this point as the right that has reopened and it was noted first just several weeks ago with a scab over it and came off in just the past few days. Fortunately there does not appear to be any obvious evidence of significant active infection at this time which is great news. No fevers, chills, nausea, vomiting, or diarrhea. 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 There does not appear to be any signs of this interfering in any way with her wound. She does have swelling we previously had compression socks for her ordered but again it does not look like she wears these on a regular basis by any means. 05/26/2021 upon evaluation today patient appears to be doing well with regard to her wound which is actually showing signs of excellent improvement. There does not appear to be any signs of active infection which is great news and overall very pleased with where things stand today. No fevers, chills, nausea, vomiting, or diarrhea. 06/02/2021 upon evaluation today patient's wound actually showing signs of excellent improvement. Fortunately there does not appear  to be any signs of active infection which is great news. I think the patient is making good progress with regard to her wounds in general. 06/09/2021 upon evaluation today patient appears to be doing excellent in regard to her wounds currently. Fortunately there does not appear to be any signs of active infection which is great news. No fevers, chills, nausea, vomiting, or diarrhea. Overall extremely pleased with where things stand today. I think the patient is making excellent progress. 06/16/2021 upon evaluation today patient appears to be doing well in regard to her wound. This is going require little bit of debridement today and that was discussed with the patient. Otherwise she seems to be doing quite well and I am actually very pleased with where things stand at this point. No fevers, chills, nausea, vomiting, or diarrhea. 06/23/2021 upon evaluation today patient appears to be doing well with regard to her wounds. She has been tolerating the dressing changes without complication. Fortunately there does not appear to be any evidence of infection and she has  not had air in her home which she actually lives at an assisted living that got fixed this morning. With that being said because of that her wrap has been extremely hot and bothersome for her over the past week. 06/30/2021 upon evaluation today patient is actually making excellent progress in regard to her ankle ulcer. She has been tolerating the dressing changes without complication and overall extremely pleased with where things stand there does not appear to be any evidence of active infection which is great news. No fevers, chills, nausea, vomiting, or diarrhea. 07/07/21 upon evaluation today patients and culture on the right actually appears to be doing quite well. There does not appear to be any signs of infection and overall very pleased with where things stand today. No fevers, chills, nausea, or vomiting noted at this time. 07/14/2021  unfortunately the patient today has some evidence of deep tissue injury and pressure getting to the ankle region. Again I am not exactly sure what is going on here but this is very similar to issues that we have had in the past. I explained to the patient that she needs to be very mindful of exactly what is happening I think sleeping in bed is probably the main issue here although there could be other culprits I am not sure what else would potentially lead to this kind of a problem for her. 07/21/2021 upon evaluation today patient's wound actually showing signs of improvement compared to last week. Fortunately there does not appear to be any signs of active infection which is great news and overall very pleased with where things stand in that regard. With that being said I do believe that she is continuing to show signs of overall of getting better although I think this is still basically about what we were 2 weeks ago due to the worsening and now improvement. 07/28/2021 upon evaluation today patient appears to be doing well with regard to her wound. She does have some slough buildup on the surface of the wound which I would have to manage today. Fortunately there is no sign of active infection at this time. No fevers, chills, nausea, vomiting, or diarrhea. 08/04/2021 upon evaluation today patient appears to be doing about the same in regard to her wound. To be perfectly honest I am beginning to be a little bit concerned about the overall appearance of the wound bed. I do think possibly taking a sample right around the margin of the wound could be beneficial for her as far as identifying anything such as an inflammatory process or to be honest even a skin tag cancer type process that may be of concern here. Fortunately there does not appear to be any evidence of active infection at this time which is great news she is not having any pain also great news. 08/11/2021 upon evaluation today patient appears to  be doing well with regard to her wound. The good news is I did review her biopsy results and it showed some inflammatory mixed findings but nothing that appeared to be malignant which is great news. Overall this is more of a chronic venous stasis type issue which again is more what we have been treating. Nonetheless I just wanted to make sure before going forward that there was not anything more untoward going on at this point. 08/18/2021 upon evaluation today patient appears to be doing well with regard to her ankle ulcer. Fortunately there does not appear to be any signs of active infection at this time  which is great overall wound is dramatically improved compared to last week. Since last week I have actually placed her on doxycycline and subsequently this is a good option as far as the findings are concerned at this point. 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 the doxycycline is doing that. There does not appear to be any evidence of active infection systemically which is great news. 08/25/2021 upon evaluation today patient appears to be doing well with regard to her wound. I feel like we are finally get back on track as far as healing is concerned I am much happier with the overall appearance today. I do think that she is tolerating the dressing changes without complication which is great news. We have been using Hydrofera Blue which I think is a good option. The good news is she is also doing great in regard to her compression sock on the left which is a zipper compression that seems to be doing a great job keeping her edema under good control. 09/01/2021 upon evaluation today patient appears to be doing well with regard to her wound. Infection seems to be under much better control which is great news and very pleased in that regard. Fortunately there does not appear  to be any signs of infection currently. [name], [name] ([id number]) 09/08/2021 upon evaluation today patient actually appears to be making good progress in regard to her wound. She has been tolerating the dressing changes without complication. Fortunately there does not appear to be any evidence of active infection at this time which is great news as well. No fevers, chills, nausea, vomiting, or diarrhea. 09/22/2021 upon evaluation today patient appears to be doing well with regard to her wound although is very slow to heal. We have not looked into Apligraf yet I think that is something that we should see about doing. 09/29/2021 upon evaluation today patient's wound is actually showing signs of doing about the same. I am not seeing any evidence of worsening but also no significant evidence of improvement. We did gain approval for the organogenesis products all except for Apligraf as covered by her insurance. With that being said I do think that we can go ahead and proceed with the NuShield if the patient and her family in agreement of the plan I discussed that with him today she does have a 20% coinsurance which we also discussed. 10/06/2021 upon evaluation today patient appears to unfortunately be doing a little bit worse she appears to be infected based on what I am seeing. Fortunately there does not appear to be any signs of active infection at this time which is great news. Unfortunately it does appear to be some evidence of around the wound edge indicated by way of erythema and warmth as well as redness 10/13/2021 upon evaluation today patient actually appears to be doing excellent in regard to her ankle ulcer compared to what it was. 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 I think were fine to go  ahead and apply the NuShield today. 10/20/2021 upon evaluation today patient appears to be doing excellent in fact the NuShield seems to have done all some for her thus  far. I am actually very pleased with where we stand and overall I think that she is making great progress. There is no evidence of active infection at this time. 11/23; patient presents for follow-up. She has no issues or complaints today. She denies signs of infection. She reports stability in her wound healing. 11/03/2021 upon evaluation today patient appears to be doing well with regard to her wounds. She has been tolerating the dressing changes without complication. Fortunately there is no signs of active infection at this time. 11/10/2021 upon evaluation today patient appears to be doing well with regard to her right ankle which is actually showing signs of improvement with the NuShield I am very pleased. Subsequently in regards to the left ankle this appears to be doing okay with no evidence of issue here either. With that being said she has a significant contusion on the left leg from having fallen 2 days ago. She tells me that she was using her walker going to the closet and then when she got to the closet turned around to get something out at which point she fell according to the story. Nonetheless she does have a hematoma just below her knee on the anterior portion of her shin. My hope is that this will not open until wound although we do need I think Some compression over it also think that we need to have her use an ice as well to help with the swelling and prevent this from getting worse. The last thing she needs is a wound opened up here. 11/17/2021 upon evaluation patient's right ankle actually showing signs of improvement based on what I see currently there is a lot of new skin growth coming in which is great news. Nonetheless I do feel like that the patient is showing improvement as well in regard to her left leg with  the use of the Tubigrip it swollen but not as bruised as what I would have expected after what I was seeing last week. Nonetheless I do think doubling up on the Tubigrip would probably be beneficial to try to keep some of the edema under better control here. 11/24/2021 upon evaluation today patient appears to be doing excellent in regard to her wound. This is actually measuring significantly smaller which is great news. Fortunately I do not see any signs of active infection locally nor systemically at this point. Overall I am very pleased with how the NuShield is doing. 12/30; wound bed looks healthy however not much change in overall wound volume. We applied Nushield again today in the standard fashion 12/08/2021 upon evaluation today patient's wound actually showing signs of good improvement and I am actually very pleased with where we stand today as well. I do not see any evidence of active infection locally nor systemically which is great news. Unfortunately she has been having some issues with stomach upset and diarrhea today. 12/15/2021 upon evaluation today patient appears to be doing excellent in regard to her wound. There is a little bit of dry skin raised up around the edges of the wound but fortunately nothing too significant at this point. Fortunately I do not see any evidence of active infection either which is great news. No fevers, chills, nausea, vomiting, or diarrhea. 12/29/2021 upon evaluation today patient appears to be doing well with regard to her wound. In general I feel like she is getting very close to complete resolution. I think that she is making good progress here as well. Overall I do not see  any signs of infection and very little of this appears to actually be open. 01/12/2022 upon evaluation today patient appears to be doing a little bit worse in regard to her wound. It appears that the collagen actually trapped fluid underneath the collagen which got hard and subsequently  caused the fluid to back up. This patient has made the wound appear to be larger than what it was previous. With that being said I do not see any signs of obvious infection is not warm to touch and not significantly erythematous either which is good news. Nonetheless I do think that we will need to continue to keep an eye on this. Probably not go put on antibiotic this week but I will be too far from doing so she is actually on a Z-Pak right now I do not want to really double up on antibiotics. 01/26/2022 upon evaluation today patient's wound is showing signs of improvement although this is slow to turn back around. Fortunately there does not appear to be any evidence of active infection locally or systemically which is great news and overall I am extremely pleased with where we stand today. No fevers, chills, nausea, vomiting, or diarrhea. 02/02/2022 upon evaluation today patient appears to be doing well with regard to her wound. She has been tolerating the dressing changes without complication. Fortunately there does not appear to be any signs of active infection locally nor systemically at this time which is great news. No fevers, chills, nausea, vomiting, or diarrhea. 3/9; patient presents for follow-up. She has no issues or complaints today. She denies signs of infection. 02/16/2022 upon evaluation today patient appears to be doing well with regard to her wound. She has been tolerating the dressing changes without complication. Fortunately I do not see any evidence of active infection locally nor systemically at this point which is great news. Overall I [name], [name] ([id number]) think that the patient is making excellent progress in general. The wound is measuring smaller and though there does appear to be some need for sharp debridement today this is minimal compared to what we have noted previous. 02/23/2022 upon evaluation today patient appears to be doing well with regard to her wound she  is definitely showing signs of improvement which is great news. 03/02/2022 upon evaluation today patient appears to be doing about the same in regard to her wound. Unfortunately were not seeing significant improvement. With that being said is also not significantly worse which is great news. No fevers, chills, nausea, vomiting, or diarrhea. 03-09-2022 upon evaluation today patient appears to be doing well with regard to her wound. She has been tolerating the dressing changes without complication. Fortunately there does not appear to be any signs of active infection locally or systemically which is great news. 03-16-2022 upon evaluation today patient appears to be doing well with regard to her wound this is measuring smaller and looking much better. I am actually very pleased with where we stand and I think that the patient is making great progress. I do not see any evidence of active infection locally or systemically which is great news. No fevers, chills, nausea, vomiting, or diarrhea. 03-23-2022 upon evaluation today patient appears to be doing better in regard to her wounds. In fact the wound area actually is showing signs of excellent improvement and actually very pleased with where we stand today. I do not see any evidence of active infection locally nor systemically which is great news. 4/27; comes in today with again thick callus  around the wound circumference which I removed with a curette. Some debris on the surface. Overall I do not think quite as good as it was last week by description. Using endoform 04-06-2022 upon evaluation today patient appears to be doing pretty well in regard to her wound. Fortunately I do not see any evidence of active infection locally or systemically which is great news and overall I am pleased in that regard. I do feel like this is measuring smaller postdebridement compared to where we were previous. Overall she is headed in the right direction. She does have an area  that is threatening to open and looks a little irritated on the left ankle we did measure this just for the discolored area for now its not draining too much but we want to monitor and make sure nothing worsens here. 04-13-2022 upon evaluation today patient appears to be doing better in regard to her wound although this is still very slow to heal. We have reapplied for a skin substitute but have not heard anything back as of yet. Fortunately I do not see any evidence of active infection locally or systemically at this time which is great news. 04-20-2022 upon evaluation today patient's wound is actually showing signs of significant improvement which is great news. We did get approval for skin substitutes which I think is an option here for [place] but at the same time I am not even certain that this is going to be necessary especially considering how well things seem to be doing at this point. If she continues healing as we see her right now but I think were probably going to be able to avoid any need for additional skin substitute. Patient and her daughter are both in agreement with that plan. With that being said she did have a fall she has several areas of contusion fortunately nothing that is can require intervention at this point but nonetheless she did also fracture her rib which is extremely uncomfortable obviously. 04-27-2022 upon evaluation today patient appears to be doing well currently in regard to her wound on the right lateral ankle. Overall I feel like she is actually doing significantly better even compared to last week and very pleased in this regard. I think we are on the right track here. 05-04-2022 upon evaluation today patient appears to be doing well with regard to her wound. She is going require some sharp debridement today to clear away some of the necrotic debris. Fortunately I was able to do this quite easily without significant issue or breakdown here. There is a very small area of  opening centrally but really this is pretty close to getting close. 05-11-2022 upon evaluation today patient's wound is actually showing signs of excellent improvement. I do not see any evidence of infection locally or systemically at this time. Overall I think she is very close to complete resolution with just a little bit of debridement to clear away some of the dry crusty area around the edges of the wound currently. 05-18-2022 upon evaluation patient's wound bed actually appears to be likely healed although I cannot be 100% sure I am extremely pleased however with where things stand today. I do not see any evidence of infection locally or systemically which is great news and overall I think you are on the right track here. 05-25-2022 upon evaluation today patient appears to be doing well with regard to her leg ulcer. Fortunately there does not appear to be any signs of active infection locally or  systemically at this time which is great news. No fevers, chills, nausea, vomiting, or diarrhea. With that being said she does appear to be healed in regard to her right lateral ankle which is great news. In regard to her leg unfortunately during the removal of the compression wrap there was a small skin tear that occurred with the scissors. Subsequently the patient does have a minimal amount of bleeding this is going require a couple Steri-Strips in order to seal this up. I do think it should likely heal quite well. Obviously this was unintentional. Nonetheless we do have to definitely monitor make sure that this completely heals up as well I am hopeful that we will be the case by next week to be honest. 06-01-2022 upon evaluation today patient appears to be doing excellent in regard to her wounds. Both areas are completely healed which is great news. Fortunately I do not see any signs of active infection locally or systemically at this time which is great and overall I am extremely pleased in that regard.  No fevers, chills, nausea, vomiting, or diarrhea. 7/25; patient returns to clinic today with a story that 2 to 3 days ago she was sitting in her chair and she noticed bleeding from her left lower leg lateral aspect. She denies any trauma. She used pressure and a Band-Aid to get it to stop and she arrives in clinic today in follow-up. She was discharged on 6/29 apparently to stockings although she did not have any on and I do not think she was actually wearing them. She has significant chronic venous insufficiency Electronic Signature(s) Signed: 06/27/2022 4:13:45 PM By: Linton Ham MD Entered By: Linton Ham on 06/27/2022 11:34:08 Solis, [name] ([id number]) -------------------------------------------------------------------------------- Physical Exam Details Patient Name: [name] Date of Service: 06/27/2022 10:30 AM Medical Record Number: [id number] Patient Account Number: [id number] Date of Birth/Sex: 03/27/1923 (88 y.o. F) Treating RN: Cornell Barman Primary Care Provider: Adrian Prows Other Clinician: Massie Kluver Referring Provider: Adrian Prows Treating Provider/Extender: Tito Dine in Treatment: 68 Constitutional Patient is hypertensive.. Pulse regular and within target range for patient.Marland Kitchen Respirations regular, non-labored and within target range.. Temperature is normal and within the target range for the patient.Marland Kitchen appears in no distress. Cardiovascular Pedal pulses are palpable on the left. She has severe chronic venous insufficiency hemosiderin deposition but not much in the way of edema. Notes Wound exam; left lateral mid calf. Thick black eschar. Just above this was the raised swelling that was somewhat tender but no open area. I used a #5 curette to remove the eschar there was a punched out wound underneath this base of this was clean Electronic Signature(s) Signed: 06/27/2022 4:13:45 PM By: Linton Ham MD Entered By: Linton Ham on 06/27/2022 11:35:37 Mesenbrink, [name] ([id number]) -------------------------------------------------------------------------------- Physician Orders Details Patient Name: [name] Date of Service: 06/27/2022 10:30 AM Medical Record Number: [id number] Patient Account Number: [id number] Date of Birth/Sex: 1923/10/25 (88 y.o. F) Treating RN: Cornell Barman Primary Care Provider: Adrian Prows Other Clinician: Massie Kluver Referring Provider: Adrian Prows Treating Provider/Extender: Tito Dine in Treatment: 90 Verbal / Phone Orders: No Diagnosis Coding ICD-10 Coding Code Description [id number] Non-pressure chronic ulcer of other part of left lower leg with other specified severity S80.12XD Contusion of left lower leg, subsequent encounter I89.0 Lymphedema, not elsewhere classified I87.2 Venous insufficiency (chronic) (peripheral) Z95.0 Presence of cardiac pacemaker Z79.01 Long term (current) use of anticoagulants Discharge From [facility] o Wear compression garments daily. Put garments  on first thing when you wake up and remove them before bed. - right leg Edema Control - Lymphedema / Segmental Compressive Device / Other o 3 Layer Compression System for Lymphedema. - left lower leg o Elevate legs to the level of the heart and pump ankles as often as possible Wound Treatment Wound #10 - Lower Leg Wound Laterality: Left, Lateral Cleanser: Byram Ancillary Kit - 15 Day Supply Discharge Instructions: Use supplies as instructed; Kit contains: (15) Saline Bullets; (15) 3x3 Gauze; 15 pr Gloves Cleanser: Soap and Water Discharge Instructions: Gently cleanse wound with antibacterial soap, rinse and pat dry prior to dressing wounds Primary Dressing: Prisma 4.34 (in) Discharge Instructions: Moisten w/normal saline or sterile water; Cover wound as directed. Do not remove from wound bed. Primary Dressing: Secondary Dressing: ABD Pad 5x9  (in/in) Discharge Instructions: Cover with ABD pad Secondary Dressing: hydrogel Compression Wrap: 3-LAYER WRAP - Profore Lite LF 3 Multilayer Compression Bandaging System Discharge Instructions: Apply 3 multi-layer wrap as prescribed. Electronic Signature(s) Signed: 06/27/2022 4:13:45 PM By: Linton Ham MD Signed: 06/28/2022 8:24:32 AM By: Massie Kluver Entered By: Massie Kluver on 06/27/2022 11:53:32 Leite, [name] ([id number]) -------------------------------------------------------------------------------- Problem List Details Patient Name: EVE, REY Date of Service: 06/27/2022 10:30 AM Medical Record Number: [id number] Patient Account Number: [id number] Date of Birth/Sex: [birth year] (88 y.o. F) Treating RN: Cornell Barman Primary Care Provider: Adrian Prows Other Clinician: Massie Kluver Referring Provider: Adrian Prows Treating Provider/Extender: Tito Dine in Treatment: 85 Active Problems ICD-10 Encounter Code Description Active Date MDM Diagnosis L97.828 Non-pressure chronic ulcer of other part of left lower leg with other 06/27/2022 No Yes specified severity S80.12XD Contusion of left lower leg, subsequent encounter 06/27/2022 No Yes I89.0 Lymphedema, not elsewhere classified 05/13/2021 No Yes I87.2 Venous insufficiency (chronic) (peripheral) 05/13/2021 No Yes Z95.0 Presence of cardiac pacemaker 05/13/2021 No Yes Z79.01 Long term (current) use of anticoagulants 05/13/2021 No Yes Inactive Problems ICD-10 Code Description Active Date Inactive Date S80.12XA Contusion of left lower leg, initial encounter 11/10/2021 11/10/2021 S81.811A Laceration without foreign body, right lower leg, initial encounter 05/25/2022 05/25/2022 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 Dr. Amalia Hailey the podiatrist saw her on 07/20/2017 and sent her to the wound center for possible hyperbaric oxygen therapy. past medical history of peripheral vascular disease, varicose veins, status post appendectomy, basal cell carcinoma excision from the left leg, cholecystectomy, pacemaker placement, right lower extremity angiography done by Dr. dew in March 2017 with placement of a stent. there is also note of a successful ablation of the right small saphenous vein done which was reviewed by ultrasound on 10/24/2016. the patient had a right small saphenous vein ablation done on 10/20/2016. The patient has never been a smoker. She has been seen by Dr. Corene Cornea dew the vascular surgeon who most recently saw her on  06/15/2017 for evaluation of ongoing problems with right leg swelling. She had a lower extremity arterial duplex examination done(02/13/17) which showed patent distal right superficial femoral artery stent and above-the-knee popliteal stent without evidence of restenosis. The ABI was more than 1.3 on the right and more than 1.3 on the left. This was consistent with noncompressible arteries due to medial calcification. The right great toe pressure and  PPG waveforms are within normal limits and the left great toe pressure and PPG waveforms are decreased. he recommended she continue to wear her compression stockings and continue with elevation. She is scheduled to have a noninvasive arterial study in the near future 08/16/2017 -- had a lower extremity arterial duplex examination done which showed patent distal right superficial femoral artery stent and above-the-knee popliteal stent without evidence of restenosis. The ABI was more than 1.3 on the right and more than 1.3 on the left. This was consistent with noncompressible arteries due to medial calcification. The right great toe pressure and PPG waveforms are within normal limits and the left great toe pressure and PPG waveforms are decreased. the x-ray of the right ankle has not yet been done 08/24/2017 -- had a right ankle x-ray -- IMPRESSION:1. No fracture, bone lesion or evidence of osteomyelitis. 2. Lateral soft tissue swelling with a soft tissue ulcer. she has not yet seen the vascular surgeon for review 08/31/17 on evaluation today patient's wound appears to be showing signs of improvement. She still with her appointment with vascular in order to review her results of her vascular study and then determine if any intervention would be recommended at that time. No fevers, chills, nausea, or vomiting noted at this time. She has been tolerating the dressing changes without complication. 09/28/17 on evaluation today patient's wound appears to show signs of good improvement in regard to the granulation tissue which is surfacing. There is still a layer of slough covering the wound and the posterior portion is still significantly deeper than the anterior nonetheless there has been some good sign of things moving towards the better. She is going to go back to Dr. dew for reevaluation to ensure her blood flow is still appropriate. That will be before her next evaluation with [place] next week. No fevers,  chills, nausea, or vomiting noted at this time. Patient does have some discomfort rated to be a 3-4/10 depending on activity specifically cleansing the wound makes it worse. 10/05/2017 -- the patient was seen by Dr. Lucky Cowboy last week and noninvasive studies showed a normal right ABI with brisk triphasic waveforms consistent with no arterial insufficiency including normal digital pressures. The duplex showed a patent distal right SFA stent and the proximal SFA was also normal. He was pleased with her test and thought she should have enough of perfusion for normal wound healing. He would see her back in 6 months time. 12/21/17 on evaluation today patient appears to be doing fairly well in regard to her right lateral ankle wound. Unfortunately the main issue that she is expansion at this point is that she is having some issues with what appears to be some cellulitis in the right anterior shin. She has also been noting a little bit of uncomfortable feeling especially last night and her ankle area. I'm afraid that she made the developing a little bit of an infection. With that being said I think it is in the early stages. 12/28/17 on evaluation today patient's ankle appears to be doing excellent. She's making good progress at this point the  cellulitis seems to have improved after last week's evaluation. Overall she is having no significant discomfort which is excellent news. She does have an appointment with Dr. dew on March 29, 2018 for reevaluation in regard to the stent he placed. She seems to have excellent blood flow in the right lower extremity. 01/19/12 on evaluation today patient's wound appears to be doing very well. In fact she does not appear to require debridement at this point, there's no evidence of infection, and overall from the standpoint of the wound she seems to be doing very well. With that being said I believe that it may be time to switch to different dressing away from the [facility]  Dressing she tells me she does have a lot going on her friend actually passed away yesterday and she's also having a lot of issues with her husband this obviously is weighing heavy on her as far as your thoughts and concerns today. 01/25/18 on evaluation today patient appears to be doing fairly well in regard to her right lateral malleolus. She has been tolerating the dressing changes without complication. Overall I feel like this is definitely showing signs of improvement as far as how the overall appearance of the wound is there's also evidence of epithelium start to migrate over the granulation tissue. In general I think that she is progressing nicely as far as the wound is concerned. The only concern she really has is whether or not we can switch to every other week visits in order to avoid having as many appointments as her daughters have a difficult time getting her to her appointments as well as the patient's husband to his he is not doing very well at this point. 02/22/18 on evaluation today patient's right lateral malleolus ulcer appears to be doing great. She has been tolerating the dressing changes without complication. Overall you making excellent progress at this time. Patient is having no significant discomfort. JANN, MILKOVICH ([id number]) 03/15/18 on evaluation today patient appears to be doing much more poorly in regard to her right lateral ankle ulcer at this point. Unfortunately since have last seen her her husband has passed just a few days ago is obviously weighed heavily on her her daughter also had surgery well she is with her today as usual. There does not appear to be any evidence of infection she does seem to have significant contusion/deep tissue injury to the right lateral malleolus which was not noted previous when I saw her last. It's hard to tell of exactly when this injury occurred although during the time she was spending the night in the hospital this may have been  most likely. 03/22/18 on evaluation today patient appears to actually be doing very well in regard to her ulcer. She did unfortunately have a setback which was noted last week however the good news is we seem to be getting back on track and in fact the wound in the core did still have some necrotic tissue which will be addressed at this point today but in general I'm seeing signs that things are on the up and up. She is glad to hear this obviously she's been somewhat concerned that due to the how her wound digressed more recently. 03/29/18 on evaluation today patient appears to be doing fairly well in regard to her right lower extremity lateral malleolus ulcer. She unfortunately does have a new area of pressure injury over the inferior portion where the wound has opened up a little bit larger secondary to  the pressure she seems to be getting. She does tell me sometimes when she sleeps at night that it actually hurts and does seem to be pushing on the area little bit more unfortunately. There does not appear to be any evidence of infection which is good news. She has been tolerating the dressing changes without complication. She also did have some bruising in the left second and third toes due to the fact that she may have bump this or injured it although she has neuropathy so she does not feel she did move recently that may have been where this came from. Nonetheless there does not appear to be any evidence of infection at this time. 04/12/18 on evaluation today patient's wound on the right lateral ankle actually appears to be doing a little bit better with a lot of necrotic docking tissue centrally loosening up in clearing away. However she does have the beginnings of a deep tissue injury on the left lateral malleolus likely due to the fact we've been trying offload the right as much as we have. I think she may benefit from an assistive soft device to help with offloading and it looks like they're  looking at one of the doughnut conditions that wraps around the lower leg to offload which I think will definitely do a good job. With that being said I think we definitely need to address this issue on the left before it becomes a wound. Patient is not having significant pain. 04/19/18 on evaluation today patient appears to be doing excellent in regard to the progress she's made with her right lateral ankle ulcer. The left ankle region which did show evidence of a deep tissue injury seems to be resolving there's little fluid noted underneath and a blister there's nothing open at this point in time overall I feel like this is progressing nicely which is good news. She does not seem to be having significant discomfort at this point which is also good news. 04/25/18-She is here in follow up evaluation for bilateral lateral malleolar ulcers. The right lateral malleolus ulcer with pale subcutaneous tissue exposure, central area of ulcer with tendon/periosteum exposed. The left lateral malleolus ulcer now with central area of nonviable tissue, otherwise deep tissue injury. She is wearing compression wraps to the left lower extremity, she will place the right lower extremity compression wraps on when she gets home. She will be out of town over the weekend and return next week and follow-up appointment. She completed her doxycycline this morning 05/03/18 on evaluation today patient appears to be doing very well in regard to her right lateral ankle ulcer in general. At least she's showing some signs of improvement in this regard. Unfortunately she has some additional injury to the left lateral malleolus region which appears to be new likely even over the past several days. Again this determination is based on the overall appearance. With that being said the patient is obviously frustrated about this currently. 05/10/18-She is here in follow-up evaluation for bilateral lateral malleolar ulcers. She states she has  purchased offloading shoes/boots and they will arrive tomorrow. She was asked to bring them in the office at next week's appointment so her provider is aware of product being utilized. She continues to sleep on right or left side, she has been encouraged to sleep on her back. The right lateral malleolus ulcer is precariously close to peri-osteum; will order xray. The left lateral malleolus ulcer is improved. Will switch back to santyl; she will follow up  next week. 05/17/18 on evaluation today patient actually appears to be doing very well in regard to her malleolus her ulcers compared to last time I saw them. She does not seem to have as much in the way of contusion at this point which is great news. With that being said she does continue to have discomfort and I do believe that she is still continuing to benefit from the offloading/pressure reducing boots that were recommended. I think this is the key to trying to get this to heal up completely. 05/24/18 on evaluation today patient actually appears to be doing worse at this point in time unfortunately compared to her last week's evaluation. She is having really no increased pain which is good news unfortunately she does have more maceration in your theme and noted surrounding the right lateral ankle the left lateral ankle is not really is erythematous I do not see signs of the overt cellulitis on that side. Unfortunately the wounds do not seem to have shown any signs of improvement since the last evaluation. She also has significant swelling especially on the right compared to previous some of this may be due to infection however also think that she may be served better while she has these wounds by compression wrapping versus continuing to use the Juxta-Lite for the time being. Especially with the amount of drainage that she is experiencing at this point. No fevers, chills, nausea, or vomiting noted at this time. 05/31/18 on evaluation today patient  appears to actually be doing better in regard to her right lateral lower extremity ulcer specifically on the malleolus region. She has been tolerating the antibiotic without complication. With that being said she still continues to have issues but a little bit of redness although nothing like she what she was experiencing previous. She still continues to pressure to her ankle area she did get the problem on offloading boots unfortunately she will not wear them she states there too uncomfortable and she can't get in and out of the bed. Nonetheless at this point her wounds seem to be continually getting worse which is not what we want I'm getting somewhat concerned about her progress and how things are going to proceed if we do not intervene in some way shape or form. I therefore had a very lengthy conversation today about offloading yet again and even made a specific suggestion for switching her to a memory foam mattress and even gave the information for a specific one that they could look at getting if it was something that they were interested in considering. She does not want to be considered for a hospital bed air mattress although honestly insurance would not cover it that she does not have any wounds on her trunk. 06/14/18 on evaluation today both wounds over the bilateral lateral malleolus her ulcers appear to be doing better there's no evidence of pressure injury at this point. She did get the foam mattress for her bed and this does seem to have been extremely beneficial for her in my pinion. Her daughter states that she is having difficulty getting out of bed because of how soft it is. The patient also relates this to be. Nonetheless I do feel like she's actually doing better. Unfortunately right after and around the time she was getting the mattress she also sustained a fall when she got up to go pick up the phone and ended up injuring her right elbow she has 18 sutures in place. We are not  caring  for this currently although home health is going to be taking the sutures out shortly. Nonetheless this may be something that we need to evaluate going forward. It depends on how well it has or has not healed in the end. She also recently saw an orthopedic specialist for an injection in the right shoulder just before her fall unfortunately the fall seems to have worsened her pain. 06/21/18 on evaluation today patient appears to be doing about the same in regard to her lateral malleolus ulcers. Both appear to be just a little bit deeper but again we are clinging away the necrotic and dead tissue which I think is why this is progressing towards a deeper realm as opposed [name], [name]. ([id number]) to improving from my measurement standpoint in that regard. Nonetheless she has been tolerating the dressing changes she absolutely hates the memory foam mattress topper that was obtained for her nonetheless I do believe this is still doing excellent as far as taking care of excess pressure in regard to the lateral malleolus regions. She in fact has no pressure injury that I see whereas in weeks past it was week by week I was constantly seeing new pressure injuries. Overall I think it has been very beneficial for her. 07/03/18; patient arrives in my clinic today. She has deep punched out areas over her bilateral lateral malleoli. The area on the right has some more depth. We spent a lot of time today talking about pressure relief for these areas. This started when her daughter asked for a prescription for a memory foam mattress. I have never written a prescription for a mattress and I don't think insurances would pay for that on an ordinary bed. In any case he came up that she has foam boots that she refuses to wear. I would suggest going to these before any other offloading issues when she is in bed. They say she is meticulous about offloading this the rest of the day 07/10/18- She is seen in  follow-up evaluation for bilateral, lateral malleolus ulcers. There is no improvement in the ulcers. She has purchased and is sleeping on a memory foam mattress/overlay, she has been using the offloading boots nightly over the past week. She has a follow up appointment with vascular medicine at the end of October, in my opinion this follow up should be expedited given her deterioration and suboptimal TBI results. We will order plain film xray of the left ankle as deeper structures are palpable; would consider having MRI, regardless of xray report(s). The ulcers will be treated with iodoflex/iodosorb, she is unable to safely change the dressings daily with santyl. 07/19/18 on evaluation today patient appears to be doing in general visually well in regard to her bilateral lateral malleolus ulcers. She has been tolerating the dressing changes without complication which is good news. With that being said we did have an x-ray performed on 07/12/18 which revealed a slight loosen see in the lateral portion of the distal left fibula which may represent artifact but underline lytic destruction or osteomyelitis could not be excluded. MRI was recommended. With that being said we can see about getting the patient scheduled for an MRI to further evaluate this area. In fact we have that scheduled currently for August 20 19,019. 07/26/18 on evaluation today patient's wound on the right lateral ankle actually appears to be doing fairly well at this point in my pinion. She has made some good progress currently. With that being said unfortunately in regard to the  left lateral ankle ulcer this seems to be a little bit more problematic at this time. In fact as I further evaluated the situation she actually had bone exposed which is the first time that's been the case in the bone appear to be necrotic. Currently I did review patient's note from Dr. Bunnie Domino office with [facility]. He stated that ABI was  1.26 on the right and 0.95 on the left with good waveforms. Her perfusion is stable not reduced from previous studies and her digital waveforms were pretty good particularly on the right. His conclusion upon review of the note was that there was not much she could do to improve her perfusion and he felt she was adequate for wound healing. His suggestion was that she continued to see [place] and consider a synthetic skin graft if there was no underlying infection. He plans to see her back in six months or as needed. 08/01/18 on evaluation today patient appears to be doing better in regard to her right lateral ankle ulcer. Her left lateral ankle ulcer is about the same she still has bone involvement in evidence of necrosis. There does not appear to be evidence of infection at this time On the right lateral lower extremity. I have started her on the Augmentin she picked this up and started this yesterday. This is to get her through until she sees infectious disease which is scheduled for 08/12/18. 08/06/18 on evaluation today patient appears to be doing rather well considering my discussion with patient's daughter at the end of last week. The area which was marked where she had erythema seems to be improved and this is good news. With that being said overall the patient seems to be making good improvement when it comes to the overall appearance of the right lateral ankle ulcer although this has been slow she at least is coming around in this regard. Unfortunately in regard to the left lateral ankle ulcer this is osteomyelitis based on the pathology report as well is bone culture. Nonetheless we are still waiting CT scan. Unfortunately the MRI we originally ordered cannot be performed as the patient is a pacemaker which I had overlooked. Nonetheless we are working on the CT scan approval and scheduling as of now. She did go to the hospital over the weekend and was placed on IV Cefzo for a couple of days.  Fortunately this seems to have improved the erythema quite significantly which is good news. There does not appear to be any evidence of worsening infection at this time. She did have some bleeding after the last debridement therefore I did not perform any sharp debridement in regard to left lateral ankle at this point. Patient has been approved for a snap vac for the right lateral ankle. 08/14/18; the patient with wounds over her bilateral lateral malleoli. The area on the right actually looks quite good. Been using a snap back on this area. Healthy granulation and appears to be filling in. Unfortunately the area on the left is really problematic. 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 Also noted to have cellulitis. She saw Dr. Novella Olive of infectious disease today and was put on linezolid. We are able to verify this with her pharmacy. She is completed the Augmentin that she was already on. We've been using Iodoflex to this area 08/23/18 on evaluation today patient's wounds both actually appear to be doing better compared  to my prior evaluations. Fortunately she showing signs of good improvement in regard to the overall wound status especially where were using the snap vac on the right. In regard to left lateral malleolus the wound bed actually appears to be much cleaner than previously noted. I do not feel any phone directly probed during evaluation today and though there is tendon noted this does not appear to be necrotic it's actually fairly good as far as the overall appearance of the tendon is concerned. In general the wound bed actually appears to be doing significantly better than it was previous. Patient is currently in the care of [name] and I did review that note today. He actually has her on two weeks of linezolid and then following the patient will be on 1-2 months of Keflex. That is the plan currently.  She has been on antibiotics therapy as prescribed by myself initially starting on July 30, 2018 and has been on that continuously up to this point. 08/30/18 on evaluation today patient actually appears to be doing much better in regard to her right lateral malleolus ulcer. She has been tolerating the dressing changes specifically the snap vac without complication although she did have some issues with the seal currently. Apparently there was some trouble with getting it to maintain over the past week past Sunday. Nonetheless overall the wound appears better in regard to the right lateral malleolus region. In regard to left lateral malleolus this actually show some signs of additional granulation although there still tendon noted in the base of the wound this appears to be healthy not necrotic in any way whatsoever. We are considering potentially using a snap vac for the left lateral malleolus as well the product wrap from [employer], [place], was present in the clinic today we're going to see this patient I did have her come in with me after obtaining consent from the patient and her daughter in order to look at the wound and see if there's any recommendation one way or another as to whether or not they felt the snapback could be beneficial for the left lateral malleolus region. But the conclusion was that it might be but that this is definitely a little bit deeper wound than what traditionally would be utilized for a snap vac. 09/06/18 on evaluation today patient actually appears to be doing excellent in my pinion in regard to both ankle ulcers. She has been tolerating the dressing changes without complication which is great news. Specifically we have been using the snap vac. In regard to the right ankle I'm not even sure that this is going to be necessary for today and following as the wound has filled in quite nicely. In regard to the left ankle I do believe [name], [name] ([id number]) that we're  seeing excellent epithelialization from the edge as well as granulation in the central portion the tendon is still exposed but there's no evidence of necrotic bone and in general I feel like the patient has made excellent progress even compared to last week with just one week of the snap vac. 09/11/18; this is a patient who has wounds on her bilateral lateral malleoli. Initially both of these were deep stage IV wounds in the setting of chronic arterial insufficiency. She has been revascularized. As I understand think she been using snap vacs to both of these wounds however the area on the right became more superficial and currently she is only using it on the left. Using silver collagen on the  right and silver collagen under the back on the left I believe 09/19/18 on evaluation today patient actually appears to be doing very well in regard to her lateral malleolus or ulcers bilaterally. She has been tolerating the dressing changes without complication. Fortunately there does not appear to be any evidence of infection at this time. Overall I feel like she is improving in an excellent manner and I'm very pleased with the fact that everything seems to be turning towards the better for her. This has obviously been a long road. 09/27/18 on evaluation today patient actually appears to be doing very well in regard to her bilateral lateral malleolus ulcers. She has been tolerating the dressing changes without complication. Fortunately there does not appear to be any evidence of infection at this time which is also great news. No fevers, chills, nausea, or vomiting noted at this time. Overall I feel like she is doing excellent with the snap vac on the left malleolus. She had 40 mL of fluid collection over the past week. 10/04/18 on evaluation today patient actually appears to be doing well in regard to her bilateral lateral malleolus ulcers. She continues to tolerate the dressing changes without complication.  One issue that I see is the snap vac on the left lateral malleolus which appears to have sealed off some fluid underlying this area and has not really allowed it to heal to the degree that I would like to see. For that reason I did suggest at this point we may want to pack a small piece of packing strip into this region to allow it to more effectively wick out fluid. 10/11/18 in general the patient today does not feel that she has been doing very well. She's been a little bit lethargic and subsequently is having bodyaches as well according to what she tells me today. With that being said overall she has been concerned with the fact that something may be worsening although to be honest her wounds really have not been appearing poorly. She does have a new ulcer on her left heel unfortunately. This may be pressure related. Nonetheless it seems to me to have potentially started at least as a blister I do not see any evidence of deep tissue injury. In regard to the left ankle the snap vac still seems to be causing the ceiling off of the deeper part of the wound which is in turn trapping fluid. I'm not extremely pleased with the overall appearance as far as progress from last week to this week therefore I'm gonna discontinue the snap vac at this point. 10/18/18 patient unfortunately this point has not been feeling well for the past several days. She was seen by Grayland Ormond her primary care provider who is a Librarian, academic at [facility]. Subsequently she states that she's been very weak and generally feeling malaise. No fevers, chills, nausea, or vomiting noted at this time. With that being said bloodwork was performed at the PCP office on the 11th of this month which showed a white blood cell count of 10.7. This was repeated today and shows a white blood cell count of 12.4. This does show signs of worsening. Coupled with the fact that she is feeling worse and that her left ankle wound is not  really showing signs of improvement I feel like this is an indication that the osteomyelitis is likely exacerbating not improving. Overall I think we may also want to check her C-reactive protein and sedimentation rate. Actually did call Corene Cornea  Whitaker's office this afternoon while the patient was in the office here with me. Subsequently based on the findings we discussed treatment possibilities and I think that it is appropriate for [place] to go ahead and initiate treatment with doxycycline which I'm going to do. Subsequently he did agree to see about adding a CRP and sedimentation rate to her orders. If that has not already been drawn to where they can run it they will contact the patient she can come back to have that check. They are in agreement with plan as far as the patient and her daughter are concerned. Nonetheless also think we need to get in touch with Dr. Henreitta Leber office to see about getting the patient scheduled with him as soon as possible. 11/08/18 on evaluation today patient presents for follow-up concerning her bilateral foot and ankle ulcers. I did do an extensive review of her chart in epic today. Subsequently she was seen by [name] he did initiate Cefepime IV antibiotic therapy. Subsequently she had some issues with her PICC line this had to be removed because it was coiled and then replaced. Fortunately that was now settled. Unfortunately she has continued have issues with her left heel as well as the issues that she is experiencing with her bilateral lateral malleolus regions. I do believe however both areas seem to be doing a little bit better on evaluation today which is good news. No fevers, chills, nausea, or vomiting noted at this time. She actually has an angiogram schedule with Dr. dew on this coming Monday, November 11, 2018. Subsequently the patient states that she is feeling much better especially than what she was roughly 2 weeks ago. She actually had to cancel an  appointment because she was feeling so poorly. No fevers, chills, nausea, or vomiting noted at this time. 11/15/18 on evaluation today patient actually is status post having had her angiogram with Dr. dew Monday, four days ago. 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 He had to go and work on several areas of the vasculature fortunately he was able to obtain no more than a 30% residual stenosis throughout post procedure. I reviewed this note today. I think this will definitely help with healing at this time. Fortunately there does not appear to be any signs of infection and I do feel like ratio already has a better appearance to it. 11/22/18 upon evaluation today patient actually appears to be doing very well in regard to her wounds in general. The right lateral malleolus looks excellent the heel looks better in the left lateral malleolus also appears to be doing a little better. With that being said the right second toe actually appears to be open and training we been watching this is been dry and stable but now is open. 12/03/2018 Seen today for follow-up and management of multiple bilateral lower extremity wounds. New pressure injury of the great toe which is closed at this time. Wound of the right distal second toe appears larger today with deep undermining and a pocket of fluid present within the undermining region. Left and right malleolus is wounds are stable today with no signs and symptoms of infection.Denies any needs or concerns during exam today. 12/13/18 on evaluation today patient appears to be doing somewhat better in regard to her left heel ulcer. She also seems to be completely healed in regard to the right lateral malleolus ulcer. The left malleolus ulcer is smaller what unfortunately the wounds which are  new over the first and second toes of the right foot are what are most concerning at this point especially the second. Both areas did require sharp  debridement today. 12/20/18 on evaluation today patient's wound actually appears to be doing better in regard to left lateral ankle and her right lateral ankle continues to remain healed. The hill ulcer on the left is improved. She does have improvement noted as well in regard to both toe ulcers. Overall I'm very pleased in this regard. No fevers, chills, nausea, or vomiting noted at this time. 12/23/18 on evaluation today patient is seen after she had her toenails trimmed at the podiatrist office due to issues with her right great toe. There was what appeared to be dark eschar on the surface of the wound which had her in the podiatrist concerned. Nonetheless as I remember that during the last office visit I had utilize silver nitrate of this area I was much less concerned about the situation. Subsequently I was able to clean off much of this tissue without any complication today. This does not appear to show any signs of infection and actually look somewhat better Reichel, [name] ([id number]) compared to last time post debridement. Her second toe on the right foot actually had callous over and there did appear still be some fluid underneath this that would require debridement today. 12/27/18 on evaluation today patient actually appears to be showing signs of improvement at all locations. Even the left lateral ankle although this is not quite as great as the other sites. Fortunately there does not appear to be any signs of infection at this time and both of her toes on the right foot seem to be showing signs of improvement which is good news and very pleased in this regard. 01/03/19 on evaluation today patient appears to be doing better for the most part in regard to her wounds in particular. There does not appear to be any evidence of infection at this time which is good news. Fortunately there is no sign of really worsening anywhere except for the right great toe which she does have what appears to  be a bruise/deep tissue injury which is very superficial and already resolving. I'm not sure where this came from I questioned her extensively and she does not recall what may have happened with this. Other than that the patient seems to be doing well even the left lateral ankle ulcer looks good and is getting smaller. 01/10/19 on evaluation today patient appears to be doing well in regard to her left heel wound and both of her toe wounds. Overall I feel like there is definitely improvement here and I'm happy in that regard. With that being said unfortunately she is having issues with the left lateral malleolus ulcer which unfortunately still has a lot of depth to it. This is gonna be a very difficult wound for [place] to be able to truly get to heal. I may want to consider some type of skin substitute to see if this would be of benefit for her. I'll discuss this with her more the next visit most likely. This was something I thought about more at the end of the visit when I was Artie out of the room and the patient had been discharged. 01/17/19 on evaluation today patient appears to be doing very well in regard to her wounds in general. She's been making excellent progress at this time. Fortunately there's no sign of infection at this time either. No fevers, chills,  nausea, or vomiting noted at this time. The biggest issue is still her left lateral malleolus where it appears to be doing well and is getting smaller but still shows a small corner where this is deeper and goes down into what appears to be the joint space. Nonetheless this is taking much longer to heal although it still looks better in smaller than previous evaluations. 01/24/19 on evaluation today patient's wounds actually appear to be doing rather well in general overall. She did require some sharp debridement in regard to the right great toe but everything else appears to be doing excellent no debridement was even necessary. No fevers, chills,  nausea, or vomiting noted at this time. 01/31/19 on evaluation today patient actually appears to be doing much better in regard to her left foot wound on the heel as well as the ankle. The right great toe appears to be a little bit worse today this had callous over and trapped a lot of fluid underneath. Fortunately there's no signs of infection at any site which is great news. 02/07/19 on evaluation today patient actually appears to be doing decently well in regard to all of her ulcers at this point. No sharp debridement was required she is a little bit of hyper granulation in regard to the left lateral ankle as well as the left heel but the hill itself is almost completely healed which is excellent news. Overall been very pleased in this regard. 02/14/19 on evaluation today patient actually appears to be doing very well in regard to her ulcers on the right first toe, left lateral malleolus, and left heel. In fact the heel is almost completely healed at this point. The patient does not show any signs of infection which is good news. Overall very pleased with how things have progressed. 04/18/19 Telehealth Evaluation During the COVID-19 National Emergency: Verbal Consent: Obtained from patient Allergies: reviewed and the active list is current. Medication changes: patient has no current medication changes. COVID-19 Screening: 1. Have you traveled internationally or on a cruise ship in the last 14 dayso No 2. Have you had contact with someone with or under investigation for COVID-19o No 3. Have you had a fever, cough, sore throat, or experiencing shortness of breatho No on evaluation today actually did have a visit with this patient through a telehealth encounter with her home health nurse. Subsequently it was noted that the patient actually appears to be doing okay in regard to her wounds both the right great toe as well as the left lateral malleolus have shown signs of improvement although this in  your theme around the left lateral malleolus there eschar coverings for both locations. The question is whether or not they are actually close and whether or not home health needs to discharge the patient or not. Nonetheless my concern is this point obviously is that without actually seeing her and being able to evaluate this directly I cannot ensure that she is completely healed which is the question that I'm being asked. 04/22/19 on evaluation today patient presents for her first evaluation since last time I saw her which was actually February 14, 2019. I did do a telehealth visit last week in which point it was questionable whether or not she may be healed and had to bring her in today for confirmation. With that being said she does seem to be doing quite well at this point which is good news. There does not appear to be any drainage in the deed I believe  her wounds may be healed. Readmission: 09/04/2019 on evaluation today patient appears to be doing unfortunately somewhat more poorly in regard to her left foot ulcer secondary to a wound that began on 08/21/2019 at least when she first noticed this. Fortunately she has not had any evidence of active infection at this time. Systemically. I also do not necessarily see any evidence of infection at the blister/wound site on the first metatarsal head plantar aspect. This almost appears to be something that may have just rubbed inappropriately causing this to breakdown. They did not want a wait too long to come in to be seen as again she had significant issues in the past with wounds that took quite a while to heal in fact it was close to 2 years. Nonetheless this does not appear to be quite that bad but again we do need to remove some of the necrotic tissue from the surface of the wound to tell exactly the extent. She does not appear to have any significant arterial disease at this point and again her last ABIs and TBI's are recorded above in the alert  section her left ABI was 1.27 with a TBI of 0.72 to the right ABI 1.08 with a TBI of 0.39. Other than this the patient has been doing quite well since I last saw her and that was in May 2020. 09/11/2019 on evaluation today patient appeared to be doing very well with regard to her plantar foot ulcer on the left. In fact this appears to be almost completely healed which is awesome. That is after just 1 week of intervention. With that being said there is no signs of active infection at this time. [name], [name] ([id number]) 09/18/2019 on evaluation today patient actually appears to be doing excellent in fact she is completely healed based on what I am seeing at this point. Fortunately there is no signs of active infection at this time and overall patient is very pleased to hear that this area has healed so quickly. Readmission: 05/13/2021 upon evaluation today this patient presents for reevaluation here in the clinic. This is a wound that actually we previously took care of. She had 1 on the right ankle and the left the left turned out to be be harder due to to heal but nonetheless is doing great at this point as the right that has reopened and it was noted first just several weeks ago with a scab over it and came off in just the past few days. Fortunately there does not appear to be any obvious evidence of significant active infection at this time which is great news. No fevers, chills, nausea, vomiting, or diarrhea. 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 There does not appear to be any signs of this interfering in any way with her wound. She does have swelling we previously had compression socks for her ordered but again it does not look like she wears these on a regular basis by any means. 05/26/2021 upon evaluation today patient appears to be doing well with regard to her wound which is actually showing signs of excellent improvement. There does  not appear to be any signs of active infection which is great news and overall very pleased with where things stand today. No fevers, chills, nausea, vomiting, or diarrhea. 06/02/2021 upon evaluation today patient's wound actually showing signs of excellent improvement. Fortunately there does not appear to be any signs of active infection which is  great news. I think the patient is making good progress with regard to her wounds in general. 06/09/2021 upon evaluation today patient appears to be doing excellent in regard to her wounds currently. Fortunately there does not appear to be any signs of active infection which is great news. No fevers, chills, nausea, vomiting, or diarrhea. Overall extremely pleased with where things stand today. I think the patient is making excellent progress. 06/16/2021 upon evaluation today patient appears to be doing well in regard to her wound. This is going require little bit of debridement today and that was discussed with the patient. Otherwise she seems to be doing quite well and I am actually very pleased with where things stand at this point. No fevers, chills, nausea, vomiting, or diarrhea. 06/23/2021 upon evaluation today patient appears to be doing well with regard to her wounds. She has been tolerating the dressing changes without complication. Fortunately there does not appear to be any evidence of infection and she has not had air in her home which she actually lives at an assisted living that got fixed this morning. With that being said because of that her wrap has been extremely hot and bothersome for her over the past week. 06/30/2021 upon evaluation today patient is actually making excellent progress in regard to her ankle ulcer. She has been tolerating the dressing changes without complication and overall extremely pleased with where things stand there does not appear to be any evidence of active infection which is great news. No fevers, chills, nausea,  vomiting, or diarrhea. 07/07/21 upon evaluation today patients and culture on the right actually appears to be doing quite well. There does not appear to be any signs of infection and overall very pleased with where things stand today. No fevers, chills, nausea, or vomiting noted at this time. 07/14/2021 unfortunately the patient today has some evidence of deep tissue injury and pressure getting to the ankle region. Again I am not exactly sure what is going on here but this is very similar to issues that we have had in the past. I explained to the patient that she needs to be very mindful of exactly what is happening I think sleeping in bed is probably the main issue here although there could be other culprits I am not sure what else would potentially lead to this kind of a problem for her. 07/21/2021 upon evaluation today patient's wound actually showing signs of improvement compared to last week. Fortunately there does not appear to be any signs of active infection which is great news and overall very pleased with where things stand in that regard. With that being said I do believe that she is continuing to show signs of overall of getting better although I think this is still basically about what we were 2 weeks ago due to the worsening and now improvement. 07/28/2021 upon evaluation today patient appears to be doing well with regard to her wound. She does have some slough buildup on the surface of the wound which I would have to manage today. Fortunately there is no sign of active infection at this time. No fevers, chills, nausea, vomiting, or diarrhea. 08/04/2021 upon evaluation today patient appears to be doing about the same in regard to her wound. To be perfectly honest I am beginning to be a little bit concerned about the overall appearance of the wound bed. I do think possibly taking a sample right around the margin of the wound could be beneficial for her as far as  identifying anything such as an  inflammatory process or to be honest even a skin tag cancer type process that may be of concern here. Fortunately there does not appear to be any evidence of active infection at this time which is great news she is not having any pain also great news. 08/11/2021 upon evaluation today patient appears to be doing well with regard to her wound. The good news is I did review her biopsy results and it showed some inflammatory mixed findings but nothing that appeared to be malignant which is great news. Overall this is more of a chronic venous stasis type issue which again is more what we have been treating. Nonetheless I just wanted to make sure before going forward that there was not anything more untoward going on at this point. 08/18/2021 upon evaluation today patient appears to be doing well with regard to her ankle ulcer. Fortunately there does not appear to be any signs of active infection at this time which is great overall wound is dramatically improved compared to last week. Since last week I have actually placed her on doxycycline and subsequently this is a good option as far as the findings are concerned at this point. 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 the doxycycline is doing that. There does not appear to be any evidence of active infection systemically which is great news. 08/25/2021 upon evaluation today patient appears to be doing well with regard to her wound. I feel like we are finally get back on track as far as healing is concerned I am much happier with the overall appearance today. I do think that she is tolerating the dressing changes without complication which is great news. We have been using Hydrofera Blue which I think is a good option. The good news is she is also doing great in regard to her compression sock on the left which is a zipper  compression that seems to be doing a great job keeping her edema under good control. 09/01/2021 upon evaluation today patient appears to be doing well with regard to her wound. Infection seems to be under much better control which is great news and very pleased in that regard. Fortunately there does not appear to be any signs of infection currently. [name], [name] ([id number]) 09/08/2021 upon evaluation today patient actually appears to be making good progress in regard to her wound. She has been tolerating the dressing changes without complication. Fortunately there does not appear to be any evidence of active infection at this time which is great news as well. No fevers, chills, nausea, vomiting, or diarrhea. 09/22/2021 upon evaluation today patient appears to be doing well with regard to her wound although is very slow to heal. We have not looked into Apligraf yet I think that is something that we should see about doing. 09/29/2021 upon evaluation today patient's wound is actually showing signs of doing about the same. I am not seeing any evidence of worsening but also no significant evidence of improvement. We did gain approval for the organogenesis products all except for Apligraf as covered by her insurance. With that being said I do think that we can go ahead and proceed with the NuShield if the patient and her family in agreement of the plan I discussed that with him today she does have a 20% coinsurance which we also  discussed. 10/06/2021 upon evaluation today patient appears to unfortunately be doing a little bit worse she appears to be infected based on what I am seeing. Fortunately there does not appear to be any signs of active infection at this time which is great news. Unfortunately it does appear to be some evidence of around the wound edge indicated by way of erythema and warmth as well as redness 10/13/2021 upon evaluation today patient actually appears to be doing excellent in  regard to her ankle ulcer compared to what it was. 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 I think were fine to go ahead and apply the NuShield today. 10/20/2021 upon evaluation today patient appears to be doing excellent in fact the NuShield seems to have done all some for her thus far. I am actually very pleased with where we stand and overall I think that she is making great progress. There is no evidence of active infection at this time. 11/23; patient presents for follow-up. She has no issues or complaints today. She denies signs of infection. She reports stability in her wound healing. 11/03/2021 upon evaluation today patient appears to be doing well with regard to her wounds. She has been tolerating the dressing changes without complication. Fortunately there is no signs of active infection at this time. 11/10/2021 upon evaluation today patient appears to be doing well with regard to her right ankle which is actually showing signs of improvement with the NuShield I am very pleased. Subsequently in regards to the left ankle this appears to be doing okay with no evidence of issue here either. With that being said she has a significant contusion on the left leg from having fallen 2 days ago. She tells me that she was using her walker going to the closet and then when she got to the closet turned around to get something out at which point she fell according to the story. Nonetheless she does have a hematoma just below her knee on the anterior portion of her shin. My hope is that this will not open until wound although we do need I think Some compression over it also think that we need to have her use an ice as well to help with the swelling and prevent this from getting worse. The last thing she needs  is a wound opened up here. 11/17/2021 upon evaluation patient's right ankle actually showing signs of improvement based on what I see currently there is a lot of new skin growth coming in which is great news. Nonetheless I do feel like that the patient is showing improvement as well in regard to her left leg with the use of the Tubigrip it swollen but not as bruised as what I would have expected after what I was seeing last week. Nonetheless I do think doubling up on the Tubigrip would probably be beneficial to try to keep some of the edema under better control here. 11/24/2021 upon evaluation today patient appears to be doing excellent in regard to her wound. This is actually measuring significantly smaller which is great news. Fortunately I do not see any signs of active infection locally nor systemically at this point. Overall I am very pleased with how the NuShield is doing. 12/30; wound bed looks healthy however not much change in overall wound  volume. We applied Nushield again today in the standard fashion 12/08/2021 upon evaluation today patient's wound actually showing signs of good improvement and I am actually very pleased with where we stand today as well. I do not see any evidence of active infection locally nor systemically which is great news. Unfortunately she has been having some issues with stomach upset and diarrhea today. 12/15/2021 upon evaluation today patient appears to be doing excellent in regard to her wound. There is a little bit of dry skin raised up around the edges of the wound but fortunately nothing too significant at this point. Fortunately I do not see any evidence of active infection either which is great news. No fevers, chills, nausea, vomiting, or diarrhea. 12/29/2021 upon evaluation today patient appears to be doing well with regard to her wound. In general I feel like she is getting very close to complete resolution. I think that she is making good progress here as  well. Overall I do not see any signs of infection and very little of this appears to actually be open. 01/12/2022 upon evaluation today patient appears to be doing a little bit worse in regard to her wound. It appears that the collagen actually trapped fluid underneath the collagen which got hard and subsequently caused the fluid to back up. This patient has made the wound appear to be larger than what it was previous. With that being said I do not see any signs of obvious infection is not warm to touch and not significantly erythematous either which is good news. Nonetheless I do think that we will need to continue to keep an eye on this. Probably not go put on antibiotic this week but I will be too far from doing so she is actually on a Z-Pak right now I do not want to really double up on antibiotics. 01/26/2022 upon evaluation today patient's wound is showing signs of improvement although this is slow to turn back around. Fortunately there does not appear to be any evidence of active infection locally or systemically which is great news and overall I am extremely pleased with where we stand today. No fevers, chills, nausea, vomiting, or diarrhea. 02/02/2022 upon evaluation today patient appears to be doing well with regard to her wound. She has been tolerating the dressing changes without complication. Fortunately there does not appear to be any signs of active infection locally nor systemically at this time which is great news. No fevers, chills, nausea, vomiting, or diarrhea. 3/9; patient presents for follow-up. She has no issues or complaints today. She denies signs of infection. 02/16/2022 upon evaluation today patient appears to be doing well with regard to her wound. She has been tolerating the dressing changes without complication. Fortunately I do not see any evidence of active infection locally nor systemically at this point which is great news. Overall I [name], [name] ([id number]) think  that the patient is making excellent progress in general. The wound is measuring smaller and though there does appear to be some need for sharp debridement today this is minimal compared to what we have noted previous. 02/23/2022 upon evaluation today patient appears to be doing well with regard to her wound she is definitely showing signs of improvement which is great news. 03/02/2022 upon evaluation today patient appears to be doing about the same in regard to her wound. Unfortunately were not seeing significant improvement. With that being said is also not significantly worse which is great news. No fevers, chills, nausea, vomiting, or  diarrhea. 03-09-2022 upon evaluation today patient appears to be doing well with regard to her wound. She has been tolerating the dressing changes without complication. Fortunately there does not appear to be any signs of active infection locally or systemically which is great news. 03-16-2022 upon evaluation today patient appears to be doing well with regard to her wound this is measuring smaller and looking much better. I am actually very pleased with where we stand and I think that the patient is making great progress. I do not see any evidence of active infection locally or systemically which is great news. No fevers, chills, nausea, vomiting, or diarrhea. 03-23-2022 upon evaluation today patient appears to be doing better in regard to her wounds. In fact the wound area actually is showing signs of excellent improvement and actually very pleased with where we stand today. I do not see any evidence of active infection locally nor systemically which is great news. 4/27; comes in today with again thick callus around the wound circumference which I removed with a curette. Some debris on the surface. Overall I do not think quite as good as it was last week by description. Using endoform 04-06-2022 upon evaluation today patient appears to be doing pretty well in regard to her  wound. Fortunately I do not see any evidence of active infection locally or systemically which is great news and overall I am pleased in that regard. I do feel like this is measuring smaller postdebridement compared to where we were previous. Overall she is headed in the right direction. She does have an area that is threatening to open and looks a little irritated on the left ankle we did measure this just for the discolored area for now its not draining too much but we want to monitor and make sure nothing worsens here. 04-13-2022 upon evaluation today patient appears to be doing better in regard to her wound although this is still very slow to heal. We have reapplied for a skin substitute but have not heard anything back as of yet. Fortunately I do not see any evidence of active infection locally or systemically at this time which is great news. 04-20-2022 upon evaluation today patient's wound is actually showing signs of significant improvement which is great news. We did get approval for skin substitutes which I think is an option here for [place] but at the same time I am not even certain that this is going to be necessary especially considering how well things seem to be doing at this point. If she continues healing as we see her right now but I think were probably going to be able to avoid any need for additional skin substitute. Patient and her daughter are both in agreement with that plan. With that being said she did have a fall she has several areas of contusion fortunately nothing that is can require intervention at this point but nonetheless she did also fracture her rib which is extremely uncomfortable obviously. 04-27-2022 upon evaluation today patient appears to be doing well currently in regard to her wound on the right lateral ankle. Overall I feel like she is actually doing significantly better even compared to last week and very pleased in this regard. I think we are on the right track  here. 05-04-2022 upon evaluation today patient appears to be doing well with regard to her wound. She is going require some sharp debridement today to clear away some of the necrotic debris. Fortunately I was able to do this quite  easily without significant issue or breakdown here. There is a very small area of opening centrally but really this is pretty close to getting close. 05-11-2022 upon evaluation today patient's wound is actually showing signs of excellent improvement. I do not see any evidence of infection locally or systemically at this time. Overall I think she is very close to complete resolution with just a little bit of debridement to clear away some of the dry crusty area around the edges of the wound currently. 05-18-2022 upon evaluation patient's wound bed actually appears to be likely healed although I cannot be 100% sure I am extremely pleased however with where things stand today. I do not see any evidence of infection locally or systemically which is great news and overall I think you are on the right track here. 05-25-2022 upon evaluation today patient appears to be doing well with regard to her leg ulcer. Fortunately there does not appear to be any signs of active infection locally or systemically at this time which is great news. No fevers, chills, nausea, vomiting, or diarrhea. With that being said she does appear to be healed in regard to her right lateral ankle which is great news. In regard to her leg unfortunately during the removal of the compression wrap there was a small skin tear that occurred with the scissors. Subsequently the patient does have a minimal amount of bleeding this is going require a couple Steri-Strips in order to seal this up. I do think it should likely heal quite well. Obviously this was unintentional. Nonetheless we do have to definitely monitor make sure that this completely heals up as well I am hopeful that we will be the case by next week to be  honest. 06-01-2022 upon evaluation today patient appears to be doing excellent in regard to her wounds. Both areas are completely healed which is great news. Fortunately I do not see any signs of active infection locally or systemically at this time which is great and overall I am extremely pleased in that regard. No fevers, chills, nausea, vomiting, or diarrhea. 7/25; patient returns to clinic today with a story that 2 to 3 days ago she was sitting in her chair and she noticed bleeding from her left lower leg lateral aspect. She denies any trauma. She used pressure and a Band-Aid to get it to stop and she arrives in clinic today in follow-up. She was discharged on 6/29 apparently to stockings although she did not have any on and I do not think she was actually wearing them. She has significant chronic venous insufficiency Mccalip, SAYDIE GERDTS ([id number]) Objective Constitutional Patient is hypertensive.. Pulse regular and within target range for patient.Marland Kitchen Respirations regular, non-labored and within target range.. Temperature is normal and within the target range for the patient.Marland Kitchen appears in no distress. Vitals Time Taken: 10:46 AM, Height: 62 in, Weight: 150 lbs, BMI: 27.4, Temperature: 98.3 F, Pulse: 91 bpm, Respiratory Rate: 18 breaths/min, Blood Pressure: 157/91 mmHg. Cardiovascular Pedal pulses are palpable on the left. She has severe chronic venous insufficiency hemosiderin deposition but not much in the way of edema. General Notes: Wound exam; left lateral mid calf. Thick black eschar. Just above this was the raised swelling that was somewhat tender but no open area. I used a #5 curette to remove the eschar there was a punched out wound underneath this base of this was clean Integumentary (Hair, Skin) Wound #10 status is Open. Original cause of wound was Trauma. The date acquired was: 06/24/2022. The  wound is located on the Left,Lateral Lower Leg. The wound measures 1cm length x 1.5cm  width x 0.2cm depth; 1.178cm^2 area and 0.236cm^3 volume. There is Fat Layer (Subcutaneous Tissue) exposed. There is no tunneling or undermining noted. There is a large amount of serosanguineous drainage noted. The wound margin is flat and intact. There is no granulation within the wound bed. There is no necrotic tissue within the wound bed. Assessment Active Problems ICD-10 Non-pressure chronic ulcer of other part of left lower leg with other specified severity Contusion of left lower leg, subsequent encounter Lymphedema, not elsewhere classified Venous insufficiency (chronic) (peripheral) Presence of cardiac pacemaker Long term (current) use of anticoagulants Procedures Wound #10 Pre-procedure diagnosis of Wound #10 is a Trauma, Other located on the Left,Lateral Lower Leg . There was a Excisional Skin/Subcutaneous Tissue Debridement with a total area of 1.5 sq cm performed by Ricard Dillon, MD. With the following instrument(s): Curette to remove Viable and Non-Viable tissue/material. Material removed includes Eschar and Subcutaneous Tissue and. A time out was conducted at 11:15, prior to the start of the procedure. A Minimum amount of bleeding was controlled with Pressure. The procedure was tolerated well. Post Debridement Measurements: 1cm length x 1.5cm width x 0.2cm depth; 0.236cm^3 volume. Character of Wound/Ulcer Post Debridement is stable. Post procedure Diagnosis Wound #10: Same as Pre-Procedure Pre-procedure diagnosis of Wound #10 is a Trauma, Other located on the Left,Lateral Lower Leg . There was a Three Layer Compression Therapy Procedure by Cornell Barman, RN. Post procedure Diagnosis Wound #10: Same as Pre-Procedure Notes: patient tolerates wrap well. Plan Discharge From [facility] Services: Wear compression garments daily. Put garments on first thing when you wake up and remove them before bed. - right leg Edema Control - Lymphedema / Segmental Compressive Device / Other: 3  Layer Compression System for Lymphedema. - left lower leg Elevate legs to the level of the heart and pump ankles as often as possible WOUND #10: - Lower Leg Wound Laterality: Left, Lateral Cleanser: Byram Ancillary Kit - 15 Day Supply Discharge Instructions: Use supplies as instructed; Kit contains: (15) Saline Bullets; (15) 3x3 Gauze; 15 pr Gloves Leitz, [name] ([id number]) Cleanser: Soap and Water Discharge Instructions: Gently cleanse wound with antibacterial soap, rinse and pat dry prior to dressing wounds Primary Dressing: Prisma 4.34 (in) Discharge Instructions: Moisten w/normal saline or sterile water; Cover wound as directed. Do not remove from wound bed. Primary Dressing: Secondary Dressing: ABD Pad 5x9 (in/in) Discharge Instructions: Cover with ABD pad Secondary Dressing: hydrogel Compression Wrap: 3-LAYER WRAP - Profore Lite LF 3 Multilayer Compression Bandaging System Discharge Instructions: Apply 3 multi-layer wrap as prescribed. 1. I am not certain of the cause of this but I suspect this was a small hematoma or abrasion. What may be more confident about this hypothesis was the swelling just above this which I suspect was also related to the same trauma. In any case she has a quarter sized wound with roughly 2 mm of depth clean granulated base. There was no evidence of infection 2. If she was instructed to wear stocking she did not. She tells me she does have stockings but clearly was not wearing them 3. We dressed this with Prisma hydrogel ABDs and put her in 3 layer compression. Her pedal pulses were palpable on the left. She had an ABI done in 2020 which was quite normal. Electronic Signature(s) Signed: 06/28/2022 1:13:50 PM By: Gretta Cool, BSN, RN, CWS, Kim RN, BSN Signed: 06/29/2022 4:27:18 PM By: Linton Ham MD  Previous Signature: 06/27/2022 4:13:45 PM Version By: Linton Ham MD Entered By: Gretta Cool, BSN, RN, CWS, Kim on 06/28/2022 13:13:50 Montminy, [name]  ([id number]) -------------------------------------------------------------------------------- SuperBill Details Patient Name: [name] Date of Service: 06/27/2022 Medical Record Number: [id number] Patient Account Number: [id number] Date of Birth/Sex: [birth year] (88 y.o. F) Treating RN: Cornell Barman Primary Care Provider: Adrian Prows Other Clinician: Massie Kluver Referring Provider: Adrian Prows Treating Provider/Extender: Tito Dine in Treatment: 36 Diagnosis Coding ICD-10 Codes Code Description [phone] Non-pressure chronic ulcer of other part of left lower leg with other specified severity S80.12XD Contusion of left lower leg, subsequent encounter I89.0 Lymphedema, not elsewhere classified I87.2 Venous insufficiency (chronic) (peripheral) Z95.0 Presence of cardiac pacemaker Z79.01 Long term (current) use of anticoagulants Facility Procedures CPT4 Code: [id number] Description: 11042 - DEB SUBQ TISSUE 20 SQ CM/< Modifier: Quantity: 1 CPT4 Code: Description: ICD-10 Diagnosis Description L97.828 Non-pressure chronic ulcer of other part of left lower leg with other spec S80.12XD Contusion of left lower leg, subsequent encounter Modifier: ified severity Quantity: Physician Procedures CPT4 Code: [id number] Description: 42353 - WC PHYS LEVEL 4 - EST PT Modifier: 25 Quantity: 1 CPT4 Code: Description: ICD-10 Diagnosis Description I87.2 Venous insufficiency (chronic) (peripheral) L97.828 Non-pressure chronic ulcer of other part of left lower leg with other spec S80.12XD Contusion of left lower leg, subsequent encounter Modifier: ified severity Quantity: CPT4 Code: [id number] Description: 11042 - WC PHYS SUBQ TISS 20 SQ CM Modifier: Quantity: 1 CPT4 Code: Description: ICD-10 Diagnosis Description L97.828 Non-pressure chronic ulcer of other part of left lower leg with other spec S80.12XD Contusion of left lower leg, subsequent encounter Modifier: ified  severity Quantity: Electronic Signature(s) Signed: 06/27/2022 4:13:45 PM By: Linton Ham MD Entered By: Linton Ham on 06/27/2022 11:59:20

## 2022-07-04 ENCOUNTER — Encounter: Payer: Medicare Other | Attending: Physician Assistant | Admitting: Physician Assistant

## 2022-07-04 DIAGNOSIS — I872 Venous insufficiency (chronic) (peripheral): Secondary | ICD-10-CM | POA: Insufficient documentation

## 2022-07-04 DIAGNOSIS — Z7901 Long term (current) use of anticoagulants: Secondary | ICD-10-CM | POA: Insufficient documentation

## 2022-07-04 DIAGNOSIS — L97828 Non-pressure chronic ulcer of other part of left lower leg with other specified severity: Secondary | ICD-10-CM | POA: Diagnosis not present

## 2022-07-04 DIAGNOSIS — Z950 Presence of cardiac pacemaker: Secondary | ICD-10-CM | POA: Diagnosis not present

## 2022-07-04 DIAGNOSIS — L89623 Pressure ulcer of left heel, stage 3: Secondary | ICD-10-CM | POA: Diagnosis present

## 2022-07-04 DIAGNOSIS — S8012XD Contusion of left lower leg, subsequent encounter: Secondary | ICD-10-CM | POA: Insufficient documentation

## 2022-07-04 DIAGNOSIS — G629 Polyneuropathy, unspecified: Secondary | ICD-10-CM | POA: Diagnosis not present

## 2022-07-04 DIAGNOSIS — X58XXXD Exposure to other specified factors, subsequent encounter: Secondary | ICD-10-CM | POA: Insufficient documentation

## 2022-07-04 DIAGNOSIS — I890 Lymphedema, not elsewhere classified: Secondary | ICD-10-CM | POA: Insufficient documentation

## 2022-07-04 NOTE — Progress Notes (Signed)
BRIELYN, BOSAK ([id number]) Visit Report for 07/04/2022 Arrival Information Details Patient Name: [name], [name] Date of Service: 07/04/2022 10:45 AM Medical Record Number: [id number] Patient Account Number: [id number] Date of Birth/Sex: 1923/06/23 (88 y.o. F) Treating RN: Levora Dredge Primary Care Timesha Cervantez: Adrian Prows Other Clinician: Referring Jerrick Farve: Adrian Prows Treating Charliee Krenz/Extender: Skipper Cliche in Treatment: 39 Visit Information History Since Last Visit Added or deleted any medications: No Patient Arrived: [name] Any new allergies or adverse reactions: No Arrival Time: 10:54 Had a fall or experienced change in No Accompanied By: son activities of daily living that may affect Transfer Assistance: None risk of falls: Patient Identification Verified: Yes Hospitalized since last visit: No Secondary Verification Process Completed: Yes Has Dressing in Place as Prescribed: Yes Patient Requires Transmission-Based No Has Compression in Place as Prescribed: Yes Precautions: Pain Present Now: No Patient Has Alerts: Yes Patient Alerts: Patient on Blood Thinner NOT DIABETIC ***ELIQUIS*** Electronic Signature(s) Signed: 07/04/2022 4:30:35 PM By: Levora Dredge Entered By: Levora Dredge on 07/04/2022 10:59:17 [name], [name] ([id number]) -------------------------------------------------------------------------------- Clinic Level of Care Assessment Details Patient Name: [name] Date of Service: 07/04/2022 10:45 AM Medical Record Number: [id number] Patient Account Number: [id number] Date of Birth/Sex: 1923/05/09 (88 y.o. F) Treating RN: Levora Dredge Primary Care Torianne Laflam: Adrian Prows Other Clinician: Referring Mirra Basilio: Adrian Prows Treating Brilee Port/Extender: Skipper Cliche in Treatment: 38 Clinic Level of Care Assessment Items TOOL 1 Quantity Score _0  - Use when EandM and Procedure is performed on INITIAL visit  0 ASSESSMENTS - Nursing Assessment / Reassessment _1  - General Physical Exam (combine w/ comprehensive assessment (listed just below) when performed on new 0 pt. evals) _2  - 0 Comprehensive Assessment (HX, ROS, Risk Assessments, Wounds Hx, etc.) ASSESSMENTS - Wound and Skin Assessment / Reassessment _3  - Dermatologic / Skin Assessment (not related to wound area) 0 ASSESSMENTS - Ostomy and/or Continence Assessment and Care _4  - Incontinence Assessment and Management 0 _5  - 0 Ostomy Care Assessment and Management (repouching, etc.) PROCESS - Coordination of Care _6  - Simple Patient / Family Education for ongoing care 0 _7  - 0 Complex (extensive) Patient / Family Education for ongoing care _8  - 0 Staff obtains Programmer, systems, Records, Test Results / Process Orders _9  - 0 Staff telephones HHA, Nursing Homes / Clarify orders / etc _10  - 0 Routine Transfer to another Facility (non-emergent condition) _11  - 0 Routine Hospital Admission (non-emergent condition) _12  - 0 New Admissions / Biomedical engineer / Ordering NPWT, Apligraf, etc. _13  - 0 Emergency Hospital Admission (emergent condition) PROCESS - Special Needs _14  - Pediatric / Minor Patient Management 0 _15  - 0 Isolation Patient Management _16  - 0 Hearing / Language / Visual special needs _17  - 0 Assessment of Community assistance (transportation, D/C planning, etc.) _18  - 0 Additional assistance / Altered mentation _19  - 0 Support Surface(s) Assessment (bed, cushion, seat, etc.) INTERVENTIONS - Miscellaneous _20  - External ear exam 0 _21  - 0 Patient Transfer (multiple staff / Civil Service fast streamer / Similar devices) _22  - 0 Simple Staple / Suture removal (25 or less) _23  - 0 Complex Staple / Suture removal (26 or more) _24  - 0 Hypo/Hyperglycemic Management (do not check if billed separately) _25  - 0 Ankle / Brachial Index (ABI) - do not check if billed separately Has the patient been seen at the hospital within the last three years:  Yes Total Score: 0 Level Of Care: ____ [name] ([id number]) Electronic Signature(s) Signed: 07/04/2022 4:30:35 PM By: Levora Dredge Entered By: Levora Dredge on  07/04/2022 13:26:43 [name], [name] ([id number]) -------------------------------------------------------------------------------- Compression Therapy Details Patient Name: [name], [name] Date of Service: 07/04/2022 10:45 AM Medical Record Number: [id number] Patient Account Number: [id number] Date of Birth/Sex: [birth year] (88 y.o. F) Treating RN: Levora Dredge Primary Care Wende Longstreth: Adrian Prows Other Clinician: Referring [place] Lapaglia: Adrian Prows Treating Ezriel Boffa/Extender: Skipper Cliche in Treatment: 6 Compression Therapy Performed for Wound Assessment: Wound #10 Left,Lateral Lower Leg Performed By: Clinician Levora Dredge, RN Compression Type: Three Layer Post Procedure Diagnosis Same as Pre-procedure Notes pt tolerated wrap without issue Electronic Signature(s) Signed: 07/04/2022 1:23:59 PM By: Levora Dredge Entered By: Levora Dredge on 07/04/2022 13:23:59 Witt, [name] ([id number]) -------------------------------------------------------------------------------- Encounter Discharge Information Details Patient Name: [name] Date of Service: 07/04/2022 10:45 AM Medical Record Number: [id number] Patient Account Number: [id number] Date of Birth/Sex: 1923-05-10 (88 y.o. F) Treating RN: Levora Dredge Primary Care Khristen Cheyney: Adrian Prows Other Clinician: Referring Zyan Mirkin: Adrian Prows Treating Jaelon Gatley/Extender: Skipper Cliche in Treatment: 45 Encounter Discharge Information Items Discharge Condition: Stable Ambulatory Status: Walker Discharge Destination: Home Transportation: Private Auto Accompanied By: son Schedule Follow-up Appointment: Yes Clinical Summary of Care: Electronic Signature(s) Signed: 07/04/2022 1:28:03 PM By: Levora Dredge Entered  By: Levora Dredge on 07/04/2022 13:28:03 Ha[place], [name] ([id number]) -------------------------------------------------------------------------------- Lower Extremity Assessment Details Patient Name: [name] Date of Service: 07/04/2022 10:45 AM Medical Record Number: [id number] Patient Account Number: [id number] Date of Birth/Sex: 06-26-1923 (88 y.o. F) Treating RN: Levora Dredge Primary Care Matasha Smigelski: Adrian Prows Other Clinician: Referring Temprence Rhines: Adrian Prows Treating Telisha Zawadzki/Extender: Skipper Cliche in Treatment: 59 Edema Assessment Assessed: [Left: No] [Right: No] Edema: [Left: N] [Right: o] Calf Left: Right: Point of Measurement: 33 cm From Medial Instep 28.1 cm Ankle Left: Right: Point of Measurement: 12 cm From Medial Instep 17.3 cm Vascular Assessment Pulses: Dorsalis Pedis Palpable: [Left:Yes] Electronic Signature(s) Signed: 07/04/2022 4:30:35 PM By: Levora Dredge Entered By: Levora Dredge on 07/04/2022 11:14:19 Nedrow, [name] ([id number]) -------------------------------------------------------------------------------- Multi Wound Chart Details Patient Name: [name] Date of Service: 07/04/2022 10:45 AM Medical Record Number: [id number] Patient Account Number: [id number] Date of Birth/Sex: 1923/06/16 (88 y.o. F) Treating RN: Levora Dredge Primary Care Adisa Litt: Adrian Prows Other Clinician: Referring Vernor Monnig: Adrian Prows Treating Oskar Cretella/Extender: Skipper Cliche in Treatment: 65 Vital Signs Height(in): 62 Pulse(bpm): 23 Weight(lbs): 150 Blood Pressure(mmHg): 110/63 Body Mass Index(BMI): 27.4 Temperature(F): 98 Respiratory Rate(breaths/min): 18 Photos: [N/A:N/A] Wound Location: Left, Lateral Lower Leg N/A N/A Wounding Event: Trauma N/A N/A Primary Etiology: Trauma, Other N/A N/A Comorbid History: Cataracts, Congestive Heart Failure, N/A N/A Hypertension, Peripheral Arterial Disease,  Osteoarthritis, Neuropathy Date Acquired: 06/24/2022 N/A N/A Weeks of Treatment: 1 N/A N/A Wound Status: Open N/A N/A Wound Recurrence: No N/A N/A Measurements L x W x D (cm) 1.2x1.2x0.1 N/A N/A Area (cm) : 1.131 N/A N/A Volume (cm) : 0.113 N/A N/A % Reduction in Area: 4.00% N/A N/A % Reduction in Volume: 52.10% N/A N/A Classification: Full Thickness Without Exposed N/A N/A Support Structures Exudate Amount: Large N/A N/A Exudate Type: Serosanguineous N/A N/A Exudate Color: red, brown N/A N/A Wound Margin: Flat and Intact N/A N/A Granulation Amount: Small (1-33%) N/A N/A Granulation Quality: Red N/A N/A Necrotic Amount: Large (67-100%) N/A N/A Exposed Structures: Fat Layer (Subcutaneous Tissue): N/A N/A Yes Fascia: No Tendon: No Muscle: No Joint: No Bone: No Epithelialization: None N/A N/A Treatment Notes Electronic Signature(s) Signed: 07/04/2022 4:30:35 PM By: Levora Dredge Entered By: Levora Dredge on 07/04/2022 11:33:19 Hashemi, [name] ([id number]) Hy, [name] ([id number]) -------------------------------------------------------------------------------- Multi-Disciplinary  Care Plan Details Patient Name: [name], STANIS. Date of Service: 07/04/2022 10:45 AM Medical Record Number: [id number] Patient Account Number: [id number] Date of Birth/Sex: 01-23-1923 (88 y.o. F) Treating RN: Levora Dredge Primary Care Alexi Dorminey: Adrian Prows Other Clinician: Referring Kisha Messman: Adrian Prows Treating Stillman Buenger/Extender: Skipper Cliche in Treatment: 42 Active Inactive Electronic Signature(s) Signed: 07/04/2022 4:30:35 PM By: Levora Dredge Entered By: Levora Dredge on 07/04/2022 11:33:10 Dewing, [name] ([id number]) -------------------------------------------------------------------------------- Pain Assessment Details Patient Name: [name] Date of Service: 07/04/2022 10:45 AM Medical Record Number: [id number] Patient Account Number:  [id number] Date of Birth/Sex: Feb 24, 1923 (88 y.o. F) Treating RN: Levora Dredge Primary Care [name] [name]: Adrian Prows Other Clinician: Referring Armilda Vanderlinden: Adrian Prows Treating Yoandri Congrove/Extender: Skipper Cliche in Treatment: 53 Active Problems Location of Pain Severity and Description of Pain Patient Has Paino No Site Locations Rate the pain. Current Pain Level: 0 Pain Management and Medication Current Pain Management: Electronic Signature(s) Signed: 07/04/2022 4:30:35 PM By: Levora Dredge Entered By: Levora Dredge on 07/04/2022 11:01:14 C[place], [name] ([id number]) -------------------------------------------------------------------------------- Patient/Caregiver Education Details Patient Name: [name] Date of Service: 07/04/2022 10:45 AM Medical Record Number: [id number] Patient Account Number: [id number] Date of Birth/Gender: [birth year] (88 y.o. F) Treating RN: Levora Dredge Primary Care Physician: Adrian Prows Other Clinician: Referring Physician: Adrian Prows Treating Physician/Extender: Skipper Cliche in Treatment: 44 Education Assessment Education Provided To: Patient Education Topics Provided Wound/Skin Impairment: Handouts: Caring for Your Ulcer Methods: Explain/Verbal Responses: State content correctly Electronic Signature(s) Signed: 07/04/2022 4:30:35 PM By: Levora Dredge Entered By: Levora Dredge on 07/04/2022 13:27:08 Bagot, [name] ([id number]) -------------------------------------------------------------------------------- Wound Assessment Details Patient Name: [name] Date of Service: 07/04/2022 10:45 AM Medical Record Number: [id number] Patient Account Number: [id number] Date of Birth/Sex: 1923/03/29 (88 y.o. F) Treating RN: Levora Dredge Primary Care Mathieu Schloemer: Adrian Prows Other Clinician: Referring Berthe Oley: Adrian Prows Treating Daquana Paddock/Extender: Skipper Cliche in Treatment:  59 Wound Status Wound Number: 10 Primary Trauma, Other Etiology: Wound Location: Left, Lateral Lower Leg Wound Open Wounding Event: Trauma Status: Date Acquired: 06/24/2022 Comorbid Cataracts, Congestive Heart Failure, Hypertension, Weeks Of Treatment: 1 History: Peripheral Arterial Disease, Osteoarthritis, Neuropathy Clustered Wound: No Photos Wound Measurements Length: (cm) 1.2 Width: (cm) 1.2 Depth: (cm) 0.1 Area: (cm) 1.131 Volume: (cm) 0.113 % Reduction in Area: 4% % Reduction in Volume: 52.1% Epithelialization: None Tunneling: No Undermining: No Wound Description Classification: Full Thickness Without Exposed Support Structures Wound Margin: Flat and Intact Exudate Amount: Large Exudate Type: Serosanguineous Exudate Color: red, brown Foul Odor After Cleansing: No Slough/Fibrino No Wound Bed Granulation Amount: Small (1-33%) Exposed Structure Granulation Quality: Red Fascia Exposed: No Necrotic Amount: Large (67-100%) Fat Layer (Subcutaneous Tissue) Exposed: Yes Necrotic Quality: Adherent Slough Tendon Exposed: No Muscle Exposed: No Joint Exposed: No Bone Exposed: No Treatment Notes Wound #10 (Lower Leg) Wound Laterality: Left, Lateral Cleanser [place] Ancillary Kit - 15 Day Supply Discharge Instruction: Use supplies as instructed; Kit contains: (15) Saline Bullets; (15) 3x3 Gauze; 15 pr Gloves Soap and Water [place], [name] ([id number]) Discharge Instruction: Gently cleanse wound with antibacterial soap, rinse and pat dry prior to dressing wounds Peri-Wound Care Topical Primary Dressing Prisma 4.34 (in) Discharge Instruction: Moisten w/normal saline or sterile water; Cover wound as directed. Do not remove from wound bed. Secondary Dressing ABD Pad 5x9 (in/in) Discharge Instruction: Cover with ABD pad hydrogel Secured With Compression Wrap 3-LAYER WRAP - Profore Lite LF 3 Multilayer Compression Bandaging System Discharge Instruction: Apply 3  multi-layer wrap as prescribed. Compression Stockings Add-Ons Electronic  Signature(s) Signed: 07/04/2022 4:30:35 PM By: Levora Dredge Entered By: Levora Dredge on 07/04/2022 11:13:35 Manso, [name] ([id number]) -------------------------------------------------------------------------------- Vitals Details Patient Name: [name] Date of Service: 07/04/2022 10:45 AM Medical Record Number: [id number] Patient Account Number: [id number] Date of Birth/Sex: Jan 15, 1923 (88 y.o. F) Treating RN: Levora Dredge Primary Care Azelea Seguin: Adrian Prows Other Clinician: Referring Kearra Calkin: Adrian Prows Treating Margarine Grosshans/Extender: Skipper Cliche in Treatment: 59 Vital Signs Time Taken: 10:59 Temperature (F): 98 Height (in): 62 Pulse (bpm): 64 Weight (lbs): 150 Respiratory Rate (breaths/min): 18 Body Mass Index (BMI): 27.4 Blood Pressure (mmHg): 110/63 Reference Range: 80 - 120 mg / dl Electronic Signature(s) Signed: 07/04/2022 4:30:35 PM By: Levora Dredge Entered By: Levora Dredge on 07/04/2022 11:01:05

## 2022-07-07 NOTE — Progress Notes (Signed)
HISAE, DECOURSEY ([id number]) Visit Report for 07/04/2022 Chief Complaint Document Details Patient Name: [name], [name]. Date of Service: 07/04/2022 10:45 AM Medical Record Number: [id number] Patient Account Number: [id number] Date of Birth/Sex: 1923/10/03 (88 y.o. F) Treating RN: Levora Dredge Primary Care Provider: Adrian Prows Other Clinician: Referring Provider: Adrian Prows Treating Provider/Extender: Skipper Cliche in Treatment: 18 Information Obtained from: Patient Chief Complaint Right foot ulcer Electronic Signature(s) Signed: 07/06/2022 6:08:04 PM By: Worthy Keeler PA-C Entered By: Worthy Keeler on 07/06/2022 18:08:04 Cockrum, [name] ([id number]) -------------------------------------------------------------------------------- HPI Details Patient Name: [name] Date of Service: 07/04/2022 10:45 AM Medical Record Number: [id number] Patient Account Number: [id number] Date of Birth/Sex: 1923-02-10 (88 y.o. 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 Dr. Amalia Hailey the podiatrist saw her on 07/20/2017 and sent her to the wound center for possible hyperbaric oxygen therapy. past medical history of peripheral vascular disease, varicose veins, status post appendectomy, basal cell carcinoma excision from the left leg, cholecystectomy, pacemaker placement, right lower extremity angiography done by Dr. dew in March 2017 with placement of a stent. there is also note of a successful ablation of the right small saphenous vein done which was reviewed by ultrasound on 10/24/2016. the  patient had a right small saphenous vein ablation done on 10/20/2016. The patient has never been a smoker. She has been seen by Dr. Corene Cornea dew the vascular surgeon who most recently saw her on 06/15/2017 for evaluation of ongoing problems with right leg swelling. She had a lower extremity arterial duplex examination done(02/13/17) which showed patent distal right superficial femoral artery stent and above-the-knee popliteal stent without evidence of restenosis. The ABI was more than 1.3 on the right and more than 1.3 on the left. This was consistent with noncompressible arteries due to medial calcification. The right great toe pressure and PPG waveforms are within normal limits and the left great toe pressure and PPG waveforms are decreased. he recommended she continue to wear her compression stockings and continue with elevation. She is scheduled to have a noninvasive arterial study in the near future 08/16/2017 -- had a lower extremity arterial duplex examination done which showed patent distal right superficial femoral artery stent and above-the-knee popliteal stent without evidence of restenosis. The ABI was more than 1.3 on the right and more than 1.3 on the left. This was consistent with noncompressible arteries due to medial calcification. The right great toe pressure and PPG waveforms are within normal limits and the left great toe pressure and PPG waveforms are decreased. the x-ray of the right ankle has not yet been done 08/24/2017 -- had a right ankle x-ray -- IMPRESSION:1. No fracture, bone lesion or evidence of osteomyelitis. 2. Lateral soft tissue swelling with a soft tissue ulcer. she has not yet seen the vascular surgeon for review 08/31/17 on evaluation today patient's wound appears to be showing signs of improvement. She still with her appointment with vascular in order to review her results of her vascular study and then determine if any intervention would be recommended at that  time. No fevers, chills, nausea, or vomiting noted at this time. She has been tolerating the dressing changes without complication. 09/28/17 on evaluation today patient's  wound appears to show signs of good improvement in regard to the granulation tissue which is surfacing. There is still a layer of slough covering the wound and the posterior portion is still significantly deeper than the anterior nonetheless there has been some good sign of things moving towards the better. She is going to go back to Dr. dew for reevaluation to ensure her blood flow is still appropriate. That will be before her next evaluation with [place] next week. No fevers, chills, nausea, or vomiting noted at this time. Patient does have some discomfort rated to be a 3-4/10 depending on activity specifically cleansing the wound makes it worse. 10/05/2017 -- the patient was seen by Dr. Lucky Cowboy last week and noninvasive studies showed a normal right ABI with brisk triphasic waveforms consistent with no arterial insufficiency including normal digital pressures. The duplex showed a patent distal right SFA stent and the proximal SFA was also normal. He was pleased with her test and thought she should have enough of perfusion for normal wound healing. He would see her back in 6 months time. 12/21/17 on evaluation today patient appears to be doing fairly well in regard to her right lateral ankle wound. Unfortunately the main issue that she is expansion at this point is that she is having some issues with what appears to be some cellulitis in the right anterior shin. She has also been noting a little bit of uncomfortable feeling especially last night and her ankle area. I'm afraid that she made the developing a little bit of an infection. With that being said I think it is in the early stages. 12/28/17 on evaluation today patient's ankle appears to be doing excellent. She's making good progress at this point the cellulitis seems to have improved  after last week's evaluation. Overall she is having no significant discomfort which is excellent news. She does have an appointment with Dr. dew on March 29, 2018 for reevaluation in regard to the stent he placed. She seems to have excellent blood flow in the right lower extremity. 01/19/12 on evaluation today patient's wound appears to be doing very well. In fact she does not appear to require debridement at this point, there's no evidence of infection, and overall from the standpoint of the wound she seems to be doing very well. With that being said I believe that it may be time to switch to different dressing away from the [facility] Dressing she tells me she does have a lot going on her friend actually passed away yesterday and she's also having a lot of issues with her husband this obviously is weighing heavy on her as far as your thoughts and concerns today. 01/25/18 on evaluation today patient appears to be doing fairly well in regard to her right lateral malleolus. She has been tolerating the dressing changes without complication. Overall I feel like this is definitely showing signs of improvement as far as how the overall appearance of the wound is there's also evidence of epithelium start to migrate over the granulation tissue. In general I think that she is progressing nicely as far as the wound is concerned. The only concern she really has is whether or not we can switch to every other week visits in order to avoid having as many appointments as her daughters have a difficult time getting her to her appointments as well as the patient's husband to his he is not doing very well at this point. 02/22/18 on evaluation today patient's right lateral malleolus ulcer  appears to be doing great. She has been tolerating the dressing changes without complication. Overall you making excellent progress at this time. Patient is having no significant discomfort. MATTYE, VERDONE ([id number]) 03/15/18  on evaluation today patient appears to be doing much more poorly in regard to her right lateral ankle ulcer at this point. Unfortunately since have last seen her her husband has passed just a few days ago is obviously weighed heavily on her her daughter also had surgery well she is with her today as usual. There does not appear to be any evidence of infection she does seem to have significant contusion/deep tissue injury to the right lateral malleolus which was not noted previous when I saw her last. It's hard to tell of exactly when this injury occurred although during the time she was spending the night in the hospital this may have been most likely. 03/22/18 on evaluation today patient appears to actually be doing very well in regard to her ulcer. She did unfortunately have a setback which was noted last week however the good news is we seem to be getting back on track and in fact the wound in the core did still have some necrotic tissue which will be addressed at this point today but in general I'm seeing signs that things are on the up and up. She is glad to hear this obviously she's been somewhat concerned that due to the how her wound digressed more recently. 03/29/18 on evaluation today patient appears to be doing fairly well in regard to her right lower extremity lateral malleolus ulcer. She unfortunately does have a new area of pressure injury over the inferior portion where the wound has opened up a little bit larger secondary to the pressure she seems to be getting. She does tell me sometimes when she sleeps at night that it actually hurts and does seem to be pushing on the area little bit more unfortunately. There does not appear to be any evidence of infection which is good news. She has been tolerating the dressing changes without complication. She also did have some bruising in the left second and third toes due to the fact that she may have bump this or injured it although she has  neuropathy so she does not feel she did move recently that may have been where this came from. Nonetheless there does not appear to be any evidence of infection at this time. 04/12/18 on evaluation today patient's wound on the right lateral ankle actually appears to be doing a little bit better with a lot of necrotic docking tissue centrally loosening up in clearing away. However she does have the beginnings of a deep tissue injury on the left lateral malleolus likely due to the fact we've been trying offload the right as much as we have. I think she may benefit from an assistive soft device to help with offloading and it looks like they're looking at one of the doughnut conditions that wraps around the lower leg to offload which I think will definitely do a good job. With that being said I think we definitely need to address this issue on the left before it becomes a wound. Patient is not having significant pain. 04/19/18 on evaluation today patient appears to be doing excellent in regard to the progress she's made with her right lateral ankle ulcer. The left ankle region which did show evidence of a deep tissue injury seems to be resolving there's little fluid noted underneath and a blister there's  nothing open at this point in time overall I feel like this is progressing nicely which is good news. She does not seem to be having significant discomfort at this point which is also good news. 04/25/18-She is here in follow up evaluation for bilateral lateral malleolar ulcers. The right lateral malleolus ulcer with pale subcutaneous tissue exposure, central area of ulcer with tendon/periosteum exposed. The left lateral malleolus ulcer now with central area of nonviable tissue, otherwise deep tissue injury. She is wearing compression wraps to the left lower extremity, she will place the right lower extremity compression wraps on when she gets home. She will be out of town over the weekend and return next  week and follow-up appointment. She completed her doxycycline this morning 05/03/18 on evaluation today patient appears to be doing very well in regard to her right lateral ankle ulcer in general. At least she's showing some signs of improvement in this regard. Unfortunately she has some additional injury to the left lateral malleolus region which appears to be new likely even over the past several days. Again this determination is based on the overall appearance. With that being said the patient is obviously frustrated about this currently. 05/10/18-She is here in follow-up evaluation for bilateral lateral malleolar ulcers. She states she has purchased offloading shoes/boots and they will arrive tomorrow. She was asked to bring them in the office at next week's appointment so her provider is aware of product being utilized. She continues to sleep on right or left side, she has been encouraged to sleep on her back. The right lateral malleolus ulcer is precariously close to peri-osteum; will order xray. The left lateral malleolus ulcer is improved. Will switch back to santyl; she will follow up next week. 05/17/18 on evaluation today patient actually appears to be doing very well in regard to her malleolus her ulcers compared to last time I saw them. She does not seem to have as much in the way of contusion at this point which is great news. With that being said she does continue to have discomfort and I do believe that she is still continuing to benefit from the offloading/pressure reducing boots that were recommended. I think this is the key to trying to get this to heal up completely. 05/24/18 on evaluation today patient actually appears to be doing worse at this point in time unfortunately compared to her last week's evaluation. She is having really no increased pain which is good news unfortunately she does have more maceration in your theme and noted surrounding the right lateral ankle the left  lateral ankle is not really is erythematous I do not see signs of the overt cellulitis on that side. Unfortunately the wounds do not seem to have shown any signs of improvement since the last evaluation. She also has significant swelling especially on the right compared to previous some of this may be due to infection however also think that she may be served better while she has these wounds by compression wrapping versus continuing to use the Juxta-Lite for the time being. Especially with the amount of drainage that she is experiencing at this point. No fevers, chills, nausea, or vomiting noted at this time. 05/31/18 on evaluation today patient appears to actually be doing better in regard to her right lateral lower extremity ulcer specifically on the malleolus region. She has been tolerating the antibiotic without complication. With that being said she still continues to have issues but a little bit of redness although nothing like  she what she was experiencing previous. She still continues to pressure to her ankle area she did get the problem on offloading boots unfortunately she will not wear them she states there too uncomfortable and she can't get in and out of the bed. Nonetheless at this point her wounds seem to be continually getting worse which is not what we want I'm getting somewhat concerned about her progress and how things are going to proceed if we do not intervene in some way shape or form. I therefore had a very lengthy conversation today about offloading yet again and even made a specific suggestion for switching her to a memory foam mattress and even gave the information for a specific one that they could look at getting if it was something that they were interested in considering. She does not want to be considered for a hospital bed air mattress although honestly insurance would not cover it that she does not have any wounds on her trunk. 06/14/18 on evaluation today both wounds  over the bilateral lateral malleolus her ulcers appear to be doing better there's no evidence of pressure injury at this point. She did get the foam mattress for her bed and this does seem to have been extremely beneficial for her in my pinion. Her daughter states that she is having difficulty getting out of bed because of how soft it is. The patient also relates this to be. Nonetheless I do feel like she's actually doing better. Unfortunately right after and around the time she was getting the mattress she also sustained a fall when she got up to go pick up the phone and ended up injuring her right elbow she has [address] sut[address] place. We are not caring for this currently although home health is going to be taking the sutures out shortly. Nonetheless this may be something that we need to evaluate going forward. It depends on how well it has or has not healed in the end. She also recently saw an orthopedic specialist for an injection in the right shoulder just before her fall unfortunately the fall seems to have worsened her pain. 06/21/18 on evaluation today patient appears to be doing about the same in regard to her lateral malleolus ulcers. Both appear to be just a little bit deeper but again we are clinging away the necrotic and dead tissue which I think is why this is progressing towards a deeper realm as opposed MONCHEL, POLLITT. ([id number]) to improving from my measurement standpoint in that regard. Nonetheless she has been tolerating the dressing changes she absolutely hates the memory foam mattress topper that was obtained for her nonetheless I do believe this is still doing excellent as far as taking care of excess pressure in regard to the lateral malleolus regions. She in fact has no pressure injury that I see whereas in weeks past it was week by week I was constantly seeing new pressure injuries. Overall I think it has been very beneficial for her. 07/03/18; patient arrives in my clinic  today. She has deep punched out areas over her bilateral lateral malleoli. The area on the right has some more depth. We spent a lot of time today talking about pressure relief for these areas. This started when her daughter asked for a prescription for a memory foam mattress. I have never written a prescription for a mattress and I don't think insurances would pay for that on an ordinary bed. In any case he came up that she has foam  boots that she refuses to wear. I would suggest going to these before any other offloading issues when she is in bed. They say she is meticulous about offloading this the rest of the day 07/10/18- She is seen in follow-up evaluation for bilateral, lateral malleolus ulcers. There is no improvement in the ulcers. She has purchased and is sleeping on a memory foam mattress/overlay, she has been using the offloading boots nightly over the past week. She has a follow up appointment with vascular medicine at the end of October, in my opinion this follow up should be expedited given her deterioration and suboptimal TBI results. We will order plain film xray of the left ankle as deeper structures are palpable; would consider having MRI, regardless of xray report(s). The ulcers will be treated with iodoflex/iodosorb, she is unable to safely change the dressings daily with santyl. 07/19/18 on evaluation today patient appears to be doing in general visually well in regard to her bilateral lateral malleolus ulcers. She has been tolerating the dressing changes without complication which is good news. With that being said we did have an x-ray performed on 07/12/18 which revealed a slight loosen see in the lateral portion of the distal left fibula which may represent artifact but underline lytic destruction or osteomyelitis could not be excluded. MRI was recommended. With that being said we can see about getting the patient scheduled for an MRI to further evaluate this area. In fact we have  that scheduled currently for August 20 19,019. 07/26/18 on evaluation today patient's wound on the right lateral ankle actually appears to be doing fairly well at this point in my pinion. She has made some good progress currently. With that being said unfortunately in regard to the left lateral ankle ulcer this seems to be a little bit more problematic at this time. In fact as I further evaluated the situation she actually had bone exposed which is the first time that's been the case in the bone appear to be necrotic. Currently I did review patient's note from Dr. Bunnie Domino office with [facility]. He stated that ABI was 1.26 on the right and 0.95 on the left with good waveforms. Her perfusion is stable not reduced from previous studies and her digital waveforms were pretty good particularly on the right. His conclusion upon review of the note was that there was not much she could do to improve her perfusion and he felt she was adequate for wound healing. His suggestion was that she continued to see [place] and consider a synthetic skin graft if there was no underlying infection. He plans to see her back in six months or as needed. 08/01/18 on evaluation today patient appears to be doing better in regard to her right lateral ankle ulcer. Her left lateral ankle ulcer is about the same she still has bone involvement in evidence of necrosis. There does not appear to be evidence of infection at this time On the right lateral lower extremity. I have started her on the Augmentin she picked this up and started this yesterday. This is to get her through until she sees infectious disease which is scheduled for 08/12/18. 08/06/18 on evaluation today patient appears to be doing rather well considering my discussion with patient's daughter at the end of last week. The area which was marked where she had erythema seems to be improved and this is good news. With that being said overall the patient seems  to be making good improvement when it  comes to the overall appearance of the right lateral ankle ulcer although this has been slow she at least is coming around in this regard. Unfortunately in regard to the left lateral ankle ulcer this is osteomyelitis based on the pathology report as well is bone culture. Nonetheless we are still waiting CT scan. Unfortunately the MRI we originally ordered cannot be performed as the patient is a pacemaker which I had overlooked. Nonetheless we are working on the CT scan approval and scheduling as of now. She did go to the hospital over the weekend and was placed on IV Cefzo for a couple of days. Fortunately this seems to have improved the erythema quite significantly which is good news. There does not appear to be any evidence of worsening infection at this time. She did have some bleeding after the last debridement therefore I did not perform any sharp debridement in regard to left lateral ankle at this point. Patient has been approved for a snap vac for the right lateral ankle. 08/14/18; the patient with wounds over her bilateral lateral malleoli. The area on the right actually looks quite good. Been using a snap back on this area. Healthy granulation and appears to be filling in. Unfortunately the area on the left is really problematic. 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 Also noted to have cellulitis. She saw Dr. Novella Olive of infectious disease today and was put on linezolid. We are able to verify this with her pharmacy. She is completed the Augmentin that she was already on. We've been using Iodoflex to this area 08/23/18 on evaluation today patient's wounds both actually appear to be doing better compared to my prior evaluations. Fortunately she showing signs of good improvement in regard to the overall wound status especially where were using the snap vac on the right. In  regard to left lateral malleolus the wound bed actually appears to be much cleaner than previously noted. I do not feel any phone directly probed during evaluation today and though there is tendon noted this does not appear to be necrotic it's actually fairly good as far as the overall appearance of the tendon is concerned. In general the wound bed actually appears to be doing significantly better than it was previous. Patient is currently in the care of [name] and I did review that note today. He actually has her on two weeks of linezolid and then following the patient will be on 1-2 months of Keflex. That is the plan currently. She has been on antibiotics therapy as prescribed by myself initially starting on July 30, 2018 and has been on that continuously up to this point. 08/30/18 on evaluation today patient actually appears to be doing much better in regard to her right lateral malleolus ulcer. She has been tolerating the dressing changes specifically the snap vac without complication although she did have some issues with the seal currently. Apparently there was some trouble with getting it to maintain over the past week past Sunday. Nonetheless overall the wound appears better in regard to the right lateral malleolus region. In regard to left lateral malleolus this actually show some signs of additional granulation although there still tendon noted in the base of the wound this appears to be healthy not necrotic in any way whatsoever. We are considering potentially using a snap vac for the left lateral malleolus as well the product wrap from [employer], [place], was present in  the clinic today we're going to see this patient I did have her come in with me after obtaining consent from the patient and her daughter in order to look at the wound and see if there's any recommendation one way or another as to whether or not they felt the snapback could be beneficial for the left lateral malleolus region.  But the conclusion was that it might be but that this is definitely a little bit deeper wound than what traditionally would be utilized for a snap vac. 09/06/18 on evaluation today patient actually appears to be doing excellent in my pinion in regard to both ankle ulcers. She has been tolerating the dressing changes without complication which is great news. Specifically we have been using the snap vac. In regard to the right ankle I'm not even sure that this is going to be necessary for today and following as the wound has filled in quite nicely. In regard to the left ankle I do believe SEASON, ASTACIO ([id number]) that we're seeing excellent epithelialization from the edge as well as granulation in the central portion the tendon is still exposed but there's no evidence of necrotic bone and in general I feel like the patient has made excellent progress even compared to last week with just one week of the snap vac. 09/11/18; this is a patient who has wounds on her bilateral lateral malleoli. Initially both of these were deep stage IV wounds in the setting of chronic arterial insufficiency. She has been revascularized. As I understand think she been using snap vacs to both of these wounds however the area on the right became more superficial and currently she is only using it on the left. Using silver collagen on the right and silver collagen under the back on the left I believe 09/19/18 on evaluation today patient actually appears to be doing very well in regard to her lateral malleolus or ulcers bilaterally. She has been tolerating the dressing changes without complication. Fortunately there does not appear to be any evidence of infection at this time. Overall I feel like she is improving in an excellent manner and I'm very pleased with the fact that everything seems to be turning towards the better for her. This has obviously been a long road. 09/27/18 on evaluation today patient actually appears  to be doing very well in regard to her bilateral lateral malleolus ulcers. She has been tolerating the dressing changes without complication. Fortunately there does not appear to be any evidence of infection at this time which is also great news. No fevers, chills, nausea, or vomiting noted at this time. Overall I feel like she is doing excellent with the snap vac on the left malleolus. She had 40 mL of fluid collection over the past week. 10/04/18 on evaluation today patient actually appears to be doing well in regard to her bilateral lateral malleolus ulcers. She continues to tolerate the dressing changes without complication. One issue that I see is the snap vac on the left lateral malleolus which appears to have sealed off some fluid underlying this area and has not really allowed it to heal to the degree that I would like to see. For that reason I did suggest at this point we may want to pack a small piece of packing strip into this region to allow it to more effectively wick out fluid. 10/11/18 in general the patient today does not feel that she has been doing very well. She's been a little bit lethargic and  subsequently is having bodyaches as well according to what she tells me today. With that being said overall she has been concerned with the fact that something may be worsening although to be honest her wounds really have not been appearing poorly. She does have a new ulcer on her left heel unfortunately. This may be pressure related. Nonetheless it seems to me to have potentially started at least as a blister I do not see any evidence of deep tissue injury. In regard to the left ankle the snap vac still seems to be causing the ceiling off of the deeper part of the wound which is in turn trapping fluid. I'm not extremely pleased with the overall appearance as far as progress from last week to this week therefore I'm gonna discontinue the snap vac at this point. 10/18/18 patient unfortunately  this point has not been feeling well for the past several days. She was seen by Grayland Ormond her primary care provider who is a Librarian, academic at [facility]. Subsequently she states that she's been very weak and generally feeling malaise. No fevers, chills, nausea, or vomiting noted at this time. With that being said bloodwork was performed at the PCP office on the 11th of this month which showed a white blood cell count of 10.7. This was repeated today and shows a white blood cell count of 12.4. This does show signs of worsening. Coupled with the fact that she is feeling worse and that her left ankle wound is not really showing signs of improvement I feel like this is an indication that the osteomyelitis is likely exacerbating not improving. Overall I think we may also want to check her C-reactive protein and sedimentation rate. Actually did call [facility] office this afternoon while the patient was in the office here with me. Subsequently based on the findings we discussed treatment possibilities and I think that it is appropriate for [place] to go ahead and initiate treatment with doxycycline which I'm going to do. Subsequently he did agree to see about adding a CRP and sedimentation rate to her orders. If that has not already been drawn to where they can run it they will contact the patient she can come back to have that check. They are in agreement with plan as far as the patient and her daughter are concerned. Nonetheless also think we need to get in touch with Dr. Henreitta Leber office to see about getting the patient scheduled with him as soon as possible. 11/08/18 on evaluation today patient presents for follow-up concerning her bilateral foot and ankle ulcers. I did do an extensive review of her chart in epic today. Subsequently she was seen by [name] he did initiate Cefepime IV antibiotic therapy. Subsequently she had some issues with her PICC line this had to be removed because it  was coiled and then replaced. Fortunately that was now settled. Unfortunately she has continued have issues with her left heel as well as the issues that she is experiencing with her bilateral lateral malleolus regions. I do believe however both areas seem to be doing a little bit better on evaluation today which is good news. No fevers, chills, nausea, or vomiting noted at this time. She actually has an angiogram schedule with Dr. dew on this coming Monday, November 11, 2018. Subsequently the patient states that she is feeling much better especially than what she was roughly 2 weeks ago. She actually had to cancel an appointment because she was feeling so poorly. No fevers,  chills, nausea, or vomiting noted at this time. 11/15/18 on evaluation today patient actually is status post having had her angiogram with Dr. dew Monday, four days ago. 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 He had to go and work on several areas of the vasculature fortunately he was able to obtain no more than a 30% residual stenosis throughout post procedure. I reviewed this note today. I think this will definitely help with healing at this time. Fortunately there does not appear to be any signs of infection and I do feel like ratio already has a better appearance to it. 11/22/18 upon evaluation today patient actually appears to be doing very well in regard to her wounds in general. The right lateral malleolus looks excellent the heel looks better in the left lateral malleolus also appears to be doing a little better. With that being said the right second toe actually appears to be open and training we been watching this is been dry and stable but now is open. 12/03/2018 Seen today for follow-up and management of multiple bilateral lower extremity wounds. New pressure injury of the great toe which is closed at this time. Wound of the right distal second toe appears larger today with deep undermining  and a pocket of fluid present within the undermining region. Left and right malleolus is wounds are stable today with no signs and symptoms of infection.Denies any needs or concerns during exam today. 12/13/18 on evaluation today patient appears to be doing somewhat better in regard to her left heel ulcer. She also seems to be completely healed in regard to the right lateral malleolus ulcer. The left malleolus ulcer is smaller what unfortunately the wounds which are new over the first and second toes of the right foot are what are most concerning at this point especially the second. Both areas did require sharp debridement today. 12/20/18 on evaluation today patient's wound actually appears to be doing better in regard to left lateral ankle and her right lateral ankle continues to remain healed. The hill ulcer on the left is improved. She does have improvement noted as well in regard to both toe ulcers. Overall I'm very pleased in this regard. No fevers, chills, nausea, or vomiting noted at this time. 12/23/18 on evaluation today patient is seen after she had her toenails trimmed at the podiatrist office due to issues with her right great toe. There was what appeared to be dark eschar on the surface of the wound which had her in the podiatrist concerned. Nonetheless as I remember that during the last office visit I had utilize silver nitrate of this area I was much less concerned about the situation. Subsequently I was able to clean off much of this tissue without any complication today. This does not appear to show any signs of infection and actually look somewhat better Somera, [name] ([id number]) compared to last time post debridement. Her second toe on the right foot actually had callous over and there did appear still be some fluid underneath this that would require debridement today. 12/27/18 on evaluation today patient actually appears to be showing signs of improvement at all locations. Even  the left lateral ankle although this is not quite as great as the other sites. Fortunately there does not appear to be any signs of infection at this time and both of her toes on the right foot seem to be showing signs of improvement which is good news and very pleased  in this regard. 01/03/19 on evaluation today patient appears to be doing better for the most part in regard to her wounds in particular. There does not appear to be any evidence of infection at this time which is good news. Fortunately there is no sign of really worsening anywhere except for the right great toe which she does have what appears to be a bruise/deep tissue injury which is very superficial and already resolving. I'm not sure where this came from I questioned her extensively and she does not recall what may have happened with this. Other than that the patient seems to be doing well even the left lateral ankle ulcer looks good and is getting smaller. 01/10/19 on evaluation today patient appears to be doing well in regard to her left heel wound and both of her toe wounds. Overall I feel like there is definitely improvement here and I'm happy in that regard. With that being said unfortunately she is having issues with the left lateral malleolus ulcer which unfortunately still has a lot of depth to it. This is gonna be a very difficult wound for [place] to be able to truly get to heal. I may want to consider some type of skin substitute to see if this would be of benefit for her. I'll discuss this with her more the next visit most likely. This was something I thought about more at the end of the visit when I was Artie out of the room and the patient had been discharged. 01/17/19 on evaluation today patient appears to be doing very well in regard to her wounds in general. She's been making excellent progress at this time. Fortunately there's no sign of infection at this time either. No fevers, chills, nausea, or vomiting noted at this  time. The biggest issue is still her left lateral malleolus where it appears to be doing well and is getting smaller but still shows a small corner where this is deeper and goes down into what appears to be the joint space. Nonetheless this is taking much longer to heal although it still looks better in smaller than previous evaluations. 01/24/19 on evaluation today patient's wounds actually appear to be doing rather well in general overall. She did require some sharp debridement in regard to the right great toe but everything else appears to be doing excellent no debridement was even necessary. No fevers, chills, nausea, or vomiting noted at this time. 01/31/19 on evaluation today patient actually appears to be doing much better in regard to her left foot wound on the heel as well as the ankle. The right great toe appears to be a little bit worse today this had callous over and trapped a lot of fluid underneath. Fortunately there's no signs of infection at any site which is great news. 02/07/19 on evaluation today patient actually appears to be doing decently well in regard to all of her ulcers at this point. No sharp debridement was required she is a little bit of hyper granulation in regard to the left lateral ankle as well as the left heel but the hill itself is almost completely healed which is excellent news. Overall been very pleased in this regard. 02/14/19 on evaluation today patient actually appears to be doing very well in regard to her ulcers on the right first toe, left lateral malleolus, and left heel. In fact the heel is almost completely healed at this point. The patient does not show any signs of infection which is good news. Overall very  pleased with how things have progressed. 04/18/19 Telehealth Evaluation During the COVID-19 National Emergency: Verbal Consent: Obtained from patient Allergies: reviewed and the active list is current. Medication changes: patient has no current  medication changes. COVID-19 Screening: 1. Have you traveled internationally or on a cruise ship in the last 14 dayso No 2. Have you had contact with someone with or under investigation for COVID-19o No 3. Have you had a fever, cough, sore throat, or experiencing shortness of breatho No on evaluation today actually did have a visit with this patient through a telehealth encounter with her home health nurse. Subsequently it was noted that the patient actually appears to be doing okay in regard to her wounds both the right great toe as well as the left lateral malleolus have shown signs of improvement although this in your theme around the left lateral malleolus there eschar coverings for both locations. The question is whether or not they are actually close and whether or not home health needs to discharge the patient or not. Nonetheless my concern is this point obviously is that without actually seeing her and being able to evaluate this directly I cannot ensure that she is completely healed which is the question that I'm being asked. 04/22/19 on evaluation today patient presents for her first evaluation since last time I saw her which was actually February 14, 2019. I did do a telehealth visit last week in which point it was questionable whether or not she may be healed and had to bring her in today for confirmation. With that being said she does seem to be doing quite well at this point which is good news. There does not appear to be any drainage in the deed I believe her wounds may be healed. Readmission: 09/04/2019 on evaluation today patient appears to be doing unfortunately somewhat more poorly in regard to her left foot ulcer secondary to a wound that began on 08/21/2019 at least when she first noticed this. Fortunately she has not had any evidence of active infection at this time. Systemically. I also do not necessarily see any evidence of infection at the blister/wound site on the first  metatarsal head plantar aspect. This almost appears to be something that may have just rubbed inappropriately causing this to breakdown. They did not want a wait too long to come in to be seen as again she had significant issues in the past with wounds that took quite a while to heal in fact it was close to 2 years. Nonetheless this does not appear to be quite that bad but again we do need to remove some of the necrotic tissue from the surface of the wound to tell exactly the extent. She does not appear to have any significant arterial disease at this point and again her last ABIs and TBI's are recorded above in the alert section her left ABI was 1.27 with a TBI of 0.72 to the right ABI 1.08 with a TBI of 0.39. Other than this the patient has been doing quite well since I last saw her and that was in May 2020. 09/11/2019 on evaluation today patient appeared to be doing very well with regard to her plantar foot ulcer on the left. In fact this appears to be almost completely healed which is awesome. That is after just 1 week of intervention. With that being said there is no signs of active infection at this time. AMMY, LIENHARD ([id number]) 09/18/2019 on evaluation today patient actually appears to be doing  excellent in fact she is completely healed based on what I am seeing at this point. Fortunately there is no signs of active infection at this time and overall patient is very pleased to hear that this area has healed so quickly. Readmission: 05/13/2021 upon evaluation today this patient presents for reevaluation here in the clinic. This is a wound that actually we previously took care of. She had 1 on the right ankle and the left the left turned out to be be harder due to to heal but nonetheless is doing great at this point as the right that has reopened and it was noted first just several weeks ago with a scab over it and came off in just the past few days. Fortunately there does not appear to  be any obvious evidence of significant active infection at this time which is great news. No fevers, chills, nausea, vomiting, or diarrhea. 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 There does not appear to be any signs of this interfering in any way with her wound. She does have swelling we previously had compression socks for her ordered but again it does not look like she wears these on a regular basis by any means. 05/26/2021 upon evaluation today patient appears to be doing well with regard to her wound which is actually showing signs of excellent improvement. There does not appear to be any signs of active infection which is great news and overall very pleased with where things stand today. No fevers, chills, nausea, vomiting, or diarrhea. 06/02/2021 upon evaluation today patient's wound actually showing signs of excellent improvement. Fortunately there does not appear to be any signs of active infection which is great news. I think the patient is making good progress with regard to her wounds in general. 06/09/2021 upon evaluation today patient appears to be doing excellent in regard to her wounds currently. Fortunately there does not appear to be any signs of active infection which is great news. No fevers, chills, nausea, vomiting, or diarrhea. Overall extremely pleased with where things stand today. I think the patient is making excellent progress. 06/16/2021 upon evaluation today patient appears to be doing well in regard to her wound. This is going require little bit of debridement today and that was discussed with the patient. Otherwise she seems to be doing quite well and I am actually very pleased with where things stand at this point. No fevers, chills, nausea, vomiting, or diarrhea. 06/23/2021 upon evaluation today patient appears to be doing well with regard to her wounds. She has been tolerating the dressing changes without complication.  Fortunately there does not appear to be any evidence of infection and she has not had air in her home which she actually lives at an assisted living that got fixed this morning. With that being said because of that her wrap has been extremely hot and bothersome for her over the past week. 06/30/2021 upon evaluation today patient is actually making excellent progress in regard to her ankle ulcer. She has been tolerating the dressing changes without complication and overall extremely pleased with where things stand there does not appear to be any evidence of active infection which is great news. No fevers, chills, nausea, vomiting, or diarrhea. 07/07/21 upon evaluation today patients and culture on the right actually appears to be doing quite well. There does not appear to be any signs of infection and overall very pleased with where things stand today. No fevers,  chills, nausea, or vomiting noted at this time. 07/14/2021 unfortunately the patient today has some evidence of deep tissue injury and pressure getting to the ankle region. Again I am not exactly sure what is going on here but this is very similar to issues that we have had in the past. I explained to the patient that she needs to be very mindful of exactly what is happening I think sleeping in bed is probably the main issue here although there could be other culprits I am not sure what else would potentially lead to this kind of a problem for her. 07/21/2021 upon evaluation today patient's wound actually showing signs of improvement compared to last week. Fortunately there does not appear to be any signs of active infection which is great news and overall very pleased with where things stand in that regard. With that being said I do believe that she is continuing to show signs of overall of getting better although I think this is still basically about what we were 2 weeks ago due to the worsening and now improvement. 07/28/2021 upon evaluation  today patient appears to be doing well with regard to her wound. She does have some slough buildup on the surface of the wound which I would have to manage today. Fortunately there is no sign of active infection at this time. No fevers, chills, nausea, vomiting, or diarrhea. 08/04/2021 upon evaluation today patient appears to be doing about the same in regard to her wound. To be perfectly honest I am beginning to be a little bit concerned about the overall appearance of the wound bed. I do think possibly taking a sample right around the margin of the wound could be beneficial for her as far as identifying anything such as an inflammatory process or to be honest even a skin tag cancer type process that may be of concern here. Fortunately there does not appear to be any evidence of active infection at this time which is great news she is not having any pain also great news. 08/11/2021 upon evaluation today patient appears to be doing well with regard to her wound. The good news is I did review her biopsy results and it showed some inflammatory mixed findings but nothing that appeared to be malignant which is great news. Overall this is more of a chronic venous stasis type issue which again is more what we have been treating. Nonetheless I just wanted to make sure before going forward that there was not anything more untoward going on at this point. 08/18/2021 upon evaluation today patient appears to be doing well with regard to her ankle ulcer. Fortunately there does not appear to be any signs of active infection at this time which is great overall wound is dramatically improved compared to last week. Since last week I have actually placed her on doxycycline and subsequently this is a good option as far as the findings are concerned at this point. I do believe that the positive result of MRSA is definitely something that needed to be addressed and the good news is The doxycycline is doing a good job of doing  this. the doxycycline is doing that. There does not appear to be any evidence of active infection systemically which is great news. 08/25/2021 upon evaluation today patient appears to be doing well with regard to her wound. I feel like we are finally get back on track as far as healing is concerned I am much happier with the overall appearance today. I  do think that she is tolerating the dressing changes without complication which is great news. We have been using Hydrofera Blue which I think is a good option. The good news is she is also doing great in regard to her compression sock on the left which is a zipper compression that seems to be doing a great job keeping her edema under good control. 09/01/2021 upon evaluation today patient appears to be doing well with regard to her wound. Infection seems to be under much better control which is great news and very pleased in that regard. Fortunately there does not appear to be any signs of infection currently. ELLEAN, FIRMAN ([id number]) 09/08/2021 upon evaluation today patient actually appears to be making good progress in regard to her wound. She has been tolerating the dressing changes without complication. Fortunately there does not appear to be any evidence of active infection at this time which is great news as well. No fevers, chills, nausea, vomiting, or diarrhea. 09/22/2021 upon evaluation today patient appears to be doing well with regard to her wound although is very slow to heal. We have not looked into Apligraf yet I think that is something that we should see about doing. 09/29/2021 upon evaluation today patient's wound is actually showing signs of doing about the same. I am not seeing any evidence of worsening but also no significant evidence of improvement. We did gain approval for the organogenesis products all except for Apligraf as covered by her insurance. With that being said I do think that we can go ahead and proceed with the  NuShield if the patient and her family in agreement of the plan I discussed that with him today she does have a 20% coinsurance which we also discussed. 10/06/2021 upon evaluation today patient appears to unfortunately be doing a little bit worse she appears to be infected based on what I am seeing. Fortunately there does not appear to be any signs of active infection at this time which is great news. Unfortunately it does appear to be some evidence of around the wound edge indicated by way of erythema and warmth as well as redness 10/13/2021 upon evaluation today patient actually appears to be doing excellent in regard to her ankle ulcer compared to what it was. 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 I think were fine to go ahead and apply the NuShield today. 10/20/2021 upon evaluation today patient appears to be doing excellent in fact the NuShield seems to have done all some for her thus far. I am actually very pleased with where we stand and overall I think that she is making great progress. There is no evidence of active infection at this time. 11/23; patient presents for follow-up. She has no issues or complaints today. She denies signs of infection. She reports stability in her wound healing. 11/03/2021 upon evaluation today patient appears to be doing well with regard to her wounds. She has been tolerating the dressing changes without complication. Fortunately there is no signs of active infection at this time. 11/10/2021 upon evaluation today patient appears to be doing well with regard to her right ankle which is actually showing signs of improvement with the NuShield I am very pleased. Subsequently in regards to the left ankle this appears to be doing okay with no  evidence of issue here  either. With that being said she has a significant contusion on the left leg from having fallen 2 days ago. She tells me that she was using her walker going to the closet and then when she got to the closet turned around to get something out at which point she fell according to the story. Nonetheless she does have a hematoma just below her knee on the anterior portion of her shin. My hope is that this will not open until wound although we do need I think Some compression over it also think that we need to have her use an ice as well to help with the swelling and prevent this from getting worse. The last thing she needs is a wound opened up here. 11/17/2021 upon evaluation patient's right ankle actually showing signs of improvement based on what I see currently there is a lot of new skin growth coming in which is great news. Nonetheless I do feel like that the patient is showing improvement as well in regard to her left leg with the use of the Tubigrip it swollen but not as bruised as what I would have expected after what I was seeing last week. Nonetheless I do think doubling up on the Tubigrip would probably be beneficial to try to keep some of the edema under better control here. 11/24/2021 upon evaluation today patient appears to be doing excellent in regard to her wound. This is actually measuring significantly smaller which is great news. Fortunately I do not see any signs of active infection locally nor systemically at this point. Overall I am very pleased with how the NuShield is doing. 12/30; wound bed looks healthy however not much change in overall wound volume. We applied Nushield again today in the standard fashion 12/08/2021 upon evaluation today patient's wound actually showing signs of good improvement and I am actually very pleased with where we stand today as well. I do not see any evidence of active infection locally nor systemically which is great news. Unfortunately she has been  having some issues with stomach upset and diarrhea today. 12/15/2021 upon evaluation today patient appears to be doing excellent in regard to her wound. There is a little bit of dry skin raised up around the edges of the wound but fortunately nothing too significant at this point. Fortunately I do not see any evidence of active infection either which is great news. No fevers, chills, nausea, vomiting, or diarrhea. 12/29/2021 upon evaluation today patient appears to be doing well with regard to her wound. In general I feel like she is getting very close to complete resolution. I think that she is making good progress here as well. Overall I do not see any signs of infection and very little of this appears to actually be open. 01/12/2022 upon evaluation today patient appears to be doing a little bit worse in regard to her wound. It appears that the collagen actually trapped fluid underneath the collagen which got hard and subsequently caused the fluid to back up. This patient has made the wound appear to be larger than what it was previous. With that being said I do not see any signs of obvious infection is not warm to touch and not significantly erythematous either which is good news. Nonetheless I do think that we will need to continue to keep an eye on this. Probably not go put on antibiotic this week but I will be too far from doing so she is actually on  a Z-Pak right now I do not want to really double up on antibiotics. 01/26/2022 upon evaluation today patient's wound is showing signs of improvement although this is slow to turn back around. Fortunately there does not appear to be any evidence of active infection locally or systemically which is great news and overall I am extremely pleased with where we stand today. No fevers, chills, nausea, vomiting, or diarrhea. 02/02/2022 upon evaluation today patient appears to be doing well with regard to her wound. She has been tolerating the dressing changes  without complication. Fortunately there does not appear to be any signs of active infection locally nor systemically at this time which is great news. No fevers, chills, nausea, vomiting, or diarrhea. 3/9; patient presents for follow-up. She has no issues or complaints today. She denies signs of infection. 02/16/2022 upon evaluation today patient appears to be doing well with regard to her wound. She has been tolerating the dressing changes without complication. Fortunately I do not see any evidence of active infection locally nor systemically at this point which is great news. Overall I PAYGE, EPPES ([id number]) think that the patient is making excellent progress in general. The wound is measuring smaller and though there does appear to be some need for sharp debridement today this is minimal compared to what we have noted previous. 02/23/2022 upon evaluation today patient appears to be doing well with regard to her wound she is definitely showing signs of improvement which is great news. 03/02/2022 upon evaluation today patient appears to be doing about the same in regard to her wound. Unfortunately were not seeing significant improvement. With that being said is also not significantly worse which is great news. No fevers, chills, nausea, vomiting, or diarrhea. 03-09-2022 upon evaluation today patient appears to be doing well with regard to her wound. She has been tolerating the dressing changes without complication. Fortunately there does not appear to be any signs of active infection locally or systemically which is great news. 03-16-2022 upon evaluation today patient appears to be doing well with regard to her wound this is measuring smaller and looking much better. I am actually very pleased with where we stand and I think that the patient is making great progress. I do not see any evidence of active infection locally or systemically which is great news. No fevers, chills, nausea, vomiting, or  diarrhea. 03-23-2022 upon evaluation today patient appears to be doing better in regard to her wounds. In fact the wound area actually is showing signs of excellent improvement and actually very pleased with where we stand today. I do not see any evidence of active infection locally nor systemically which is great news. 4/27; comes in today with again thick callus around the wound circumference which I removed with a curette. Some debris on the surface. Overall I do not think quite as good as it was last week by description. Using endoform 04-06-2022 upon evaluation today patient appears to be doing pretty well in regard to her wound. Fortunately I do not see any evidence of active infection locally or systemically which is great news and overall I am pleased in that regard. I do feel like this is measuring smaller postdebridement compared to where we were previous. Overall she is headed in the right direction. She does have an area that is threatening to open and looks a little irritated on the left ankle we did measure this just for the discolored area for now its not draining too much but we  want to monitor and make sure nothing worsens here. 04-13-2022 upon evaluation today patient appears to be doing better in regard to her wound although this is still very slow to heal. We have reapplied for a skin substitute but have not heard anything back as of yet. Fortunately I do not see any evidence of active infection locally or systemically at this time which is great news. 04-20-2022 upon evaluation today patient's wound is actually showing signs of significant improvement which is great news. We did get approval for skin substitutes which I think is an option here for [place] but at the same time I am not even certain that this is going to be necessary especially considering how well things seem to be doing at this point. If she continues healing as we see her right now but I think were probably going to  be able to avoid any need for additional skin substitute. Patient and her daughter are both in agreement with that plan. With that being said she did have a fall she has several areas of contusion fortunately nothing that is can require intervention at this point but nonetheless she did also fracture her rib which is extremely uncomfortable obviously. 04-27-2022 upon evaluation today patient appears to be doing well currently in regard to her wound on the right lateral ankle. Overall I feel like she is actually doing significantly better even compared to last week and very pleased in this regard. I think we are on the right track here. 05-04-2022 upon evaluation today patient appears to be doing well with regard to her wound. She is going require some sharp debridement today to clear away some of the necrotic debris. Fortunately I was able to do this quite easily without significant issue or breakdown here. There is a very small area of opening centrally but really this is pretty close to getting close. 05-11-2022 upon evaluation today patient's wound is actually showing signs of excellent improvement. I do not see any evidence of infection locally or systemically at this time. Overall I think she is very close to complete resolution with just a little bit of debridement to clear away some of the dry crusty area around the edges of the wound currently. 05-18-2022 upon evaluation patient's wound bed actually appears to be likely healed although I cannot be 100% sure I am extremely pleased however with where things stand today. I do not see any evidence of infection locally or systemically which is great news and overall I think you are on the right track here. 05-25-2022 upon evaluation today patient appears to be doing well with regard to her leg ulcer. Fortunately there does not appear to be any signs of active infection locally or systemically at this time which is great news. No fevers, chills, nausea,  vomiting, or diarrhea. With that being said she does appear to be healed in regard to her right lateral ankle which is great news. In regard to her leg unfortunately during the removal of the compression wrap there was a small skin tear that occurred with the scissors. Subsequently the patient does have a minimal amount of bleeding this is going require a couple Steri-Strips in order to seal this up. I do think it should likely heal quite well. Obviously this was unintentional. Nonetheless we do have to definitely monitor make sure that this completely heals up as well I am hopeful that we will be the case by next week to be honest. 06-01-2022 upon evaluation today patient appears  to be doing excellent in regard to her wounds. Both areas are completely healed which is great news. Fortunately I do not see any signs of active infection locally or systemically at this time which is great and overall I am extremely pleased in that regard. No fevers, chills, nausea, vomiting, or diarrhea. 7/25; patient returns to clinic today with a story that 2 to 3 days ago she was sitting in her chair and she noticed bleeding from her left lower leg lateral aspect. She denies any trauma. She used pressure and a Band-Aid to get it to stop and she arrives in clinic today in follow-up. She was discharged on 6/29 apparently to stockings although she did not have any on and I do not think she was actually wearing them. She has significant chronic venous insufficiency 07-04-2022 upon evaluation today patient presents for follow-up. In the clinic she actually was seen last week when I was on vacation due to an area on her leg which reopened unfortunately. With that being said she does seem to be doing a little bit better but unfortunately this is still giving her some trouble here. All of her other wounds are still closed which I was previously seen her for and that is great news. Electronic Signature(s) TISHINA, LOWN  ([id number]) Signed: 07/06/2022 6:09:19 PM By: Worthy Keeler PA-C Entered By: Worthy Keeler on 07/06/2022 18:09:19 Kotch, [name] ([id number]) -------------------------------------------------------------------------------- Physical Exam Details Patient Name: RENAD, JENNIGES Date of Service: 07/04/2022 10:45 AM Medical Record Number: [id number] Patient Account Number: [id number] Date of Birth/Sex: [birth year] (88 y.o. F) Treating RN: Levora Dredge Primary Care Provider: Adrian Prows Other Clinician: Referring Provider: Adrian Prows Treating Provider/Extender: Skipper Cliche in Treatment: 56 Constitutional Well-nourished and well-hydrated in no acute distress. Respiratory normal breathing without difficulty. Psychiatric this patient is able to make decisions and demonstrates good insight into disease process. Alert and Oriented x 3. pleasant and cooperative. Notes Upon inspection patient's wound bed actually showed signs of good granulation and epithelization at this point. Fortunately I do not see any evidence of active infection locally or systemically which is great news and overall I am extremely pleased with where things stand currently. Electronic Signature(s) Signed: 07/06/2022 6:09:35 PM By: Worthy Keeler PA-C Entered By: Worthy Keeler on 07/06/2022 18:09:34 Suchecki, [name] ([id number]) -------------------------------------------------------------------------------- Physician Orders Details Patient Name: [name] Date of Service: 07/04/2022 10:45 AM Medical Record Number: [id number] Patient Account Number: [id number] Date of Birth/Sex: 1923-03-07 (88 y.o. F) Treating RN: Levora Dredge Primary Care Provider: Adrian Prows Other Clinician: Referring Provider: Adrian Prows Treating Provider/Extender: Skipper Cliche in Treatment: 64 Verbal / Phone Orders: No Diagnosis Coding Discharge From [place] compression  garments daily. Put garments on first thing when you wake up and remove them before bed. - right leg Edema Control - Lymphedema / Segmental Compressive Device / Other o 3 Layer Compression System for Lymphedema. - left lower leg o Elevate legs to the level of the heart and pump ankles as often as possible Wound Treatment Wound #10 - Lower Leg Wound Laterality: Left, Lateral Cleanser: [place] Ancillary Kit - 15 Day Supply Discharge Instructions: Use supplies as instructed; Kit contains: (15) Saline Bullets; (15) 3x3 Gauze; 15 pr Gloves Cleanser: Soap and Water Discharge Instructions: Gently cleanse wound with antibacterial soap, rinse and pat dry prior to dressing wounds Primary Dressing: Prisma 4.34 (in) Discharge Instructions: Moisten w/normal saline or sterile water; Cover wound as directed. Do not remove  from wound bed. Primary Dressing: Secondary Dressing: ABD Pad 5x9 (in/in) Discharge Instructions: Cover with ABD pad Secondary Dressing: hydrogel Compression Wrap: 3-LAYER WRAP - Profore Lite LF 3 Multilayer Compression Bandaging System Discharge Instructions: Apply 3 multi-layer wrap as prescribed. Electronic Signature(s) Signed: 07/04/2022 1:26:29 PM By: Levora Dredge Signed: 07/06/2022 6:41:29 PM By: Worthy Keeler PA-C Entered By: Levora Dredge on 07/04/2022 13:26:29 Kneeland, [name] ([id number]) -------------------------------------------------------------------------------- Problem List Details Patient Name: TRANIECE, BOFFA Date of Service: 07/04/2022 10:45 AM Medical Record Number: [id number] Patient Account Number: [id number] Date of Birth/Sex: 1923-01-07 (88 y.o. 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 06/27/2022 No Yes specified severity S80.12XD Contusion of left lower leg, subsequent encounter 06/27/2022 No Yes I89.0 Lymphedema, not elsewhere classified 05/13/2021 No Yes I87.2 Venous insufficiency (chronic) (peripheral) 05/13/2021 No Yes Z95.0 Presence of cardiac pacemaker 05/13/2021 No Yes Z79.01 Long term (current) use of anticoagulants 05/13/2021 No Yes Inactive Problems ICD-10 Code Description Active Date Inactive Date 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, stage 3 05/13/2021 05/13/2021 S81.811A Laceration without foreign body, right lower leg, initial encounter 05/25/2022 05/25/2022 S80.12XA Contusion of left lower leg, initial encounter 11/10/2021 11/10/2021 Resolved Problems Electronic Signature(s) Signed: 07/06/2022 6:07:54 PM By: Worthy Keeler PA-C Entered By: Worthy Keeler on 07/06/2022 18:07:54 Munshi, [name] ([id number]) -------------------------------------------------------------------------------- Progress Note Details Patient Name: [name] Date of Service: 07/04/2022 10:45 AM Medical Record Number: [id number] Patient Account Number: [id number] Date of Birth/Sex: [birth year] (88 y.o. 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 Dr. Amalia Hailey the podiatrist saw her on 07/20/2017 and sent her to the wound center for possible hyperbaric oxygen therapy. past medical history of peripheral vascular disease, varicose veins, status post appendectomy, basal cell carcinoma excision  from the left leg, cholecystectomy, pacemaker placement, right lower extremity angiography done by Dr. dew in March 2017 with placement of a stent. there is also note of a successful ablation of the right small saphenous vein done which was reviewed by ultrasound on 10/24/2016. the patient had a right small saphenous vein ablation done on 10/20/2016. The patient has never been a smoker. She has been seen by Dr. Corene Cornea dew the vascular surgeon who most recently saw her on 06/15/2017 for evaluation of ongoing problems with right leg swelling. She had a lower extremity arterial duplex examination done(02/13/17) which showed patent distal right superficial femoral artery stent and above-the-knee popliteal stent without evidence of restenosis. The ABI was more than 1.3 on the right and more than 1.3 on the left. This was consistent with noncompressible arteries due to medial calcification. The right great toe pressure and PPG waveforms are within normal limits and the left great toe pressure and PPG waveforms are decreased. he recommended she continue to wear her compression stockings and continue with elevation. She is scheduled to have a noninvasive arterial study in the near future 08/16/2017 -- had a lower extremity arterial duplex examination done which  showed patent distal right superficial femoral artery stent and above-the-knee popliteal stent without evidence of restenosis. The ABI was more than 1.3 on the right and more than 1.3 on the left. This was consistent with noncompressible arteries due to medial calcification. The right great toe pressure and PPG waveforms are within normal limits and the left great toe pressure and PPG waveforms are decreased. the x-ray of the right ankle has not yet been done 08/24/2017 -- had a right ankle x-ray -- IMPRESSION:1. No fracture, bone lesion or evidence of osteomyelitis. 2. Lateral soft tissue swelling with a soft tissue ulcer. she has not yet seen the  vascular surgeon for review 08/31/17 on evaluation today patient's wound appears to be showing signs of improvement. She still with her appointment with vascular in order to review her results of her vascular study and then determine if any intervention would be recommended at that time. No fevers, chills, nausea, or vomiting noted at this time. She has been tolerating the dressing changes without complication. 09/28/17 on evaluation today patient's wound appears to show signs of good improvement in regard to the granulation tissue which is surfacing. There is still a layer of slough covering the wound and the posterior portion is still significantly deeper than the anterior nonetheless there has been some good sign of things moving towards the better. She is going to go back to Dr. dew for reevaluation to ensure her blood flow is still appropriate. That will be before her next evaluation with [place] next week. No fevers, chills, nausea, or vomiting noted at this time. Patient does have some discomfort rated to be a 3-4/10 depending on activity specifically cleansing the wound makes it worse. 10/05/2017 -- the patient was seen by Dr. Lucky Cowboy last week and noninvasive studies showed a normal right ABI with brisk triphasic waveforms consistent with no arterial insufficiency including normal digital pressures. The duplex showed a patent distal right SFA stent and the proximal SFA was also normal. He was pleased with her test and thought she should have enough of perfusion for normal wound healing. He would see her back in 6 months time. 12/21/17 on evaluation today patient appears to be doing fairly well in regard to her right lateral ankle wound. Unfortunately the main issue that she is expansion at this point is that she is having some issues with what appears to be some cellulitis in the right anterior shin. She has also been noting a little bit of uncomfortable feeling especially last night and her ankle area.  I'm afraid that she made the developing a little bit of an infection. With that being said I think it is in the early stages. 12/28/17 on evaluation today patient's ankle appears to be doing excellent. She's making good progress at this point the cellulitis seems to have improved after last week's evaluation. Overall she is having no significant discomfort which is excellent news. She does have an appointment with Dr. dew on March 29, 2018 for reevaluation in regard to the stent he placed. She seems to have excellent blood flow in the right lower extremity. 01/19/12 on evaluation today patient's wound appears to be doing very well. In fact she does not appear to require debridement at this point, there's no evidence of infection, and overall from the standpoint of the wound she seems to be doing very well. With that being said I believe that it may be time to switch to different dressing away from the [facility] Dressing she tells me  she does have a lot going on her friend actually passed away yesterday and she's also having a lot of issues with her husband this obviously is weighing heavy on her as far as your thoughts and concerns today. 01/25/18 on evaluation today patient appears to be doing fairly well in regard to her right lateral malleolus. She has been tolerating the dressing changes without complication. Overall I feel like this is definitely showing signs of improvement as far as how the overall appearance of the wound is there's also evidence of epithelium start to migrate over the granulation tissue. In general I think that she is progressing nicely as far as the wound is concerned. The only concern she really has is whether or not we can switch to every other week visits in order to avoid having as many ANTIA, RAHAL ([id number]) appointments as her daughters have a difficult time getting her to her appointments as well as the patient's husband to his he is not doing very well at  this point. 02/22/18 on evaluation today patient's right lateral malleolus ulcer appears to be doing great. She has been tolerating the dressing changes without complication. Overall you making excellent progress at this time. Patient is having no significant discomfort. 03/15/18 on evaluation today patient appears to be doing much more poorly in regard to her right lateral ankle ulcer at this point. Unfortunately since have last seen her her husband has passed just a few days ago is obviously weighed heavily on her her daughter also had surgery well she is with her today as usual. There does not appear to be any evidence of infection she does seem to have significant contusion/deep tissue injury to the right lateral malleolus which was not noted previous when I saw her last. It's hard to tell of exactly when this injury occurred although during the time she was spending the night in the hospital this may have been most likely. 03/22/18 on evaluation today patient appears to actually be doing very well in regard to her ulcer. She did unfortunately have a setback which was noted last week however the good news is we seem to be getting back on track and in fact the wound in the core did still have some necrotic tissue which will be addressed at this point today but in general I'm seeing signs that things are on the up and up. She is glad to hear this obviously she's been somewhat concerned that due to the how her wound digressed more recently. 03/29/18 on evaluation today patient appears to be doing fairly well in regard to her right lower extremity lateral malleolus ulcer. She unfortunately does have a new area of pressure injury over the inferior portion where the wound has opened up a little bit larger secondary to the pressure she seems to be getting. She does tell me sometimes when she sleeps at night that it actually hurts and does seem to be pushing on the area little bit more unfortunately. There  does not appear to be any evidence of infection which is good news. She has been tolerating the dressing changes without complication. She also did have some bruising in the left second and third toes due to the fact that she may have bump this or injured it although she has neuropathy so she does not feel she did move recently that may have been where this came from. Nonetheless there does not appear to be any evidence of infection at this time. 04/12/18 on evaluation today  patient's wound on the right lateral ankle actually appears to be doing a little bit better with a lot of necrotic docking tissue centrally loosening up in clearing away. However she does have the beginnings of a deep tissue injury on the left lateral malleolus likely due to the fact we've been trying offload the right as much as we have. I think she may benefit from an assistive soft device to help with offloading and it looks like they're looking at one of the doughnut conditions that wraps around the lower leg to offload which I think will definitely do a good job. With that being said I think we definitely need to address this issue on the left before it becomes a wound. Patient is not having significant pain. 04/19/18 on evaluation today patient appears to be doing excellent in regard to the progress she's made with her right lateral ankle ulcer. The left ankle region which did show evidence of a deep tissue injury seems to be resolving there's little fluid noted underneath and a blister there's nothing open at this point in time overall I feel like this is progressing nicely which is good news. She does not seem to be having significant discomfort at this point which is also good news. 04/25/18-She is here in follow up evaluation for bilateral lateral malleolar ulcers. The right lateral malleolus ulcer with pale subcutaneous tissue exposure, central area of ulcer with tendon/periosteum exposed. The left lateral malleolus ulcer  now with central area of nonviable tissue, otherwise deep tissue injury. She is wearing compression wraps to the left lower extremity, she will place the right lower extremity compression wraps on when she gets home. She will be out of town over the weekend and return next week and follow-up appointment. She completed her doxycycline this morning 05/03/18 on evaluation today patient appears to be doing very well in regard to her right lateral ankle ulcer in general. At least she's showing some signs of improvement in this regard. Unfortunately she has some additional injury to the left lateral malleolus region which appears to be new likely even over the past several days. Again this determination is based on the overall appearance. With that being said the patient is obviously frustrated about this currently. 05/10/18-She is here in follow-up evaluation for bilateral lateral malleolar ulcers. She states she has purchased offloading shoes/boots and they will arrive tomorrow. She was asked to bring them in the office at next week's appointment so her provider is aware of product being utilized. She continues to sleep on right or left side, she has been encouraged to sleep on her back. The right lateral malleolus ulcer is precariously close to peri-osteum; will order xray. The left lateral malleolus ulcer is improved. Will switch back to santyl; she will follow up next week. 05/17/18 on evaluation today patient actually appears to be doing very well in regard to her malleolus her ulcers compared to last time I saw them. She does not seem to have as much in the way of contusion at this point which is great news. With that being said she does continue to have discomfort and I do believe that she is still continuing to benefit from the offloading/pressure reducing boots that were recommended. I think this is the key to trying to get this to heal up completely. 05/24/18 on evaluation today patient actually  appears to be doing worse at this point in time unfortunately compared to her last week's evaluation. She is having really no increased pain  which is good news unfortunately she does have more maceration in your theme and noted surrounding the right lateral ankle the left lateral ankle is not really is erythematous I do not see signs of the overt cellulitis on that side. Unfortunately the wounds do not seem to have shown any signs of improvement since the last evaluation. She also has significant swelling especially on the right compared to previous some of this may be due to infection however also think that she may be served better while she has these wounds by compression wrapping versus continuing to use the Juxta-Lite for the time being. Especially with the amount of drainage that she is experiencing at this point. No fevers, chills, nausea, or vomiting noted at this time. 05/31/18 on evaluation today patient appears to actually be doing better in regard to her right lateral lower extremity ulcer specifically on the malleolus region. She has been tolerating the antibiotic without complication. With that being said she still continues to have issues but a little bit of redness although nothing like she what she was experiencing previous. She still continues to pressure to her ankle area she did get the problem on offloading boots unfortunately she will not wear them she states there too uncomfortable and she can't get in and out of the bed. Nonetheless at this point her wounds seem to be continually getting worse which is not what we want I'm getting somewhat concerned about her progress and how things are going to proceed if we do not intervene in some way shape or form. I therefore had a very lengthy conversation today about offloading yet again and even made a specific suggestion for switching her to a memory foam mattress and even gave the information for a specific one that they could look at  getting if it was something that they were interested in considering. She does not want to be considered for a hospital bed air mattress although honestly insurance would not cover it that she does not have any wounds on her trunk. 06/14/18 on evaluation today both wounds over the bilateral lateral malleolus her ulcers appear to be doing better there's no evidence of pressure injury at this point. She did get the foam mattress for her bed and this does seem to have been extremely beneficial for her in my pinion. Her daughter states that she is having difficulty getting out of bed because of how soft it is. The patient also relates this to be. Nonetheless I do feel like she's actually doing better. Unfortunately right after and around the time she was getting the mattress she also sustained a fall when she got up to go pick up the phone and ended up injuring her right elbow she has 18 sutures in place. We are not caring for this currently although home health is going to be taking the sutures out shortly. Nonetheless this may be something that we need to evaluate going forward. It depends on AMABEL, STMARIE ([id number]) how well it has or has not healed in the end. She also recently saw an orthopedic specialist for an injection in the right shoulder just before her fall unfortunately the fall seems to have worsened her pain. 06/21/18 on evaluation today patient appears to be doing about the same in regard to her lateral malleolus ulcers. Both appear to be just a little bit deeper but again we are clinging away the necrotic and dead tissue which I think is why this is progressing towards a deeper realm  as opposed to improving from my measurement standpoint in that regard. Nonetheless she has been tolerating the dressing changes she absolutely hates the memory foam mattress topper that was obtained for her nonetheless I do believe this is still doing excellent as far as taking care of excess pressure in  regard to the lateral malleolus regions. She in fact has no pressure injury that I see whereas in weeks past it was week by week I was constantly seeing new pressure injuries. Overall I think it has been very beneficial for her. 07/03/18; patient arrives in my clinic today. She has deep punched out areas over her bilateral lateral malleoli. The area on the right has some more depth. We spent a lot of time today talking about pressure relief for these areas. This started when her daughter asked for a prescription for a memory foam mattress. I have never written a prescription for a mattress and I don't think insurances would pay for that on an ordinary bed. In any case he came up that she has foam boots that she refuses to wear. I would suggest going to these before any other offloading issues when she is in bed. They say she is meticulous about offloading this the rest of the day 07/10/18- She is seen in follow-up evaluation for bilateral, lateral malleolus ulcers. There is no improvement in the ulcers. She has purchased and is sleeping on a memory foam mattress/overlay, she has been using the offloading boots nightly over the past week. She has a follow up appointment with vascular medicine at the end of October, in my opinion this follow up should be expedited given her deterioration and suboptimal TBI results. We will order plain film xray of the left ankle as deeper structures are palpable; would consider having MRI, regardless of xray report(s). The ulcers will be treated with iodoflex/iodosorb, she is unable to safely change the dressings daily with santyl. 07/19/18 on evaluation today patient appears to be doing in general visually well in regard to her bilateral lateral malleolus ulcers. She has been tolerating the dressing changes without complication which is good news. With that being said we did have an x-ray performed on 07/12/18 which revealed a slight loosen see in the lateral portion of the  distal left fibula which may represent artifact but underline lytic destruction or osteomyelitis could not be excluded. MRI was recommended. With that being said we can see about getting the patient scheduled for an MRI to further evaluate this area. In fact we have that scheduled currently for August 20 19,019. 07/26/18 on evaluation today patient's wound on the right lateral ankle actually appears to be doing fairly well at this point in my pinion. She has made some good progress currently. With that being said unfortunately in regard to the left lateral ankle ulcer this seems to be a little bit more problematic at this time. In fact as I further evaluated the situation she actually had bone exposed which is the first time that's been the case in the bone appear to be necrotic. Currently I did review patient's note from Dr. Bunnie Domino office with [facility]. He stated that ABI was 1.26 on the right and 0.95 on the left with good waveforms. Her perfusion is stable not reduced from previous studies and her digital waveforms were pretty good particularly on the right. His conclusion upon review of the note was that there was not much she could do to improve her perfusion and he felt she  was adequate for wound healing. His suggestion was that she continued to see [place] and consider a synthetic skin graft if there was no underlying infection. He plans to see her back in six months or as needed. 08/01/18 on evaluation today patient appears to be doing better in regard to her right lateral ankle ulcer. Her left lateral ankle ulcer is about the same she still has bone involvement in evidence of necrosis. There does not appear to be evidence of infection at this time On the right lateral lower extremity. I have started her on the Augmentin she picked this up and started this yesterday. This is to get her through until she sees infectious disease which is scheduled for 08/12/18. 08/06/18 on  evaluation today patient appears to be doing rather well considering my discussion with patient's daughter at the end of last week. The area which was marked where she had erythema seems to be improved and this is good news. With that being said overall the patient seems to be making good improvement when it comes to the overall appearance of the right lateral ankle ulcer although this has been slow she at least is coming around in this regard. Unfortunately in regard to the left lateral ankle ulcer this is osteomyelitis based on the pathology report as well is bone culture. Nonetheless we are still waiting CT scan. Unfortunately the MRI we originally ordered cannot be performed as the patient is a pacemaker which I had overlooked. Nonetheless we are working on the CT scan approval and scheduling as of now. She did go to the hospital over the weekend and was placed on IV Cefzo for a couple of days. Fortunately this seems to have improved the erythema quite significantly which is good news. There does not appear to be any evidence of worsening infection at this time. She did have some bleeding after the last debridement therefore I did not perform any sharp debridement in regard to left lateral ankle at this point. Patient has been approved for a snap vac for the right lateral ankle. 08/14/18; the patient with wounds over her bilateral lateral malleoli. The area on the right actually looks quite good. Been using a snap back on this area. Healthy granulation and appears to be filling in. Unfortunately the area on the left is really problematic. 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 Also noted to have cellulitis. She saw Dr. Novella Olive of infectious disease today and was put on linezolid. We are able to verify this with her pharmacy. She is completed the Augmentin that she was already on. We've been using Iodoflex to this  area 08/23/18 on evaluation today patient's wounds both actually appear to be doing better compared to my prior evaluations. Fortunately she showing signs of good improvement in regard to the overall wound status especially where were using the snap vac on the right. In regard to left lateral malleolus the wound bed actually appears to be much cleaner than previously noted. I do not feel any phone directly probed during evaluation today and though there is tendon noted this does not appear to be necrotic it's actually fairly good as far as the overall appearance of the tendon is concerned. In general the wound bed actually appears to be doing significantly better than it was previous. Patient is currently in the care of [name] and I did review that note today. He actually has her on  two weeks of linezolid and then following the patient will be on 1-2 months of Keflex. That is the plan currently. She has been on antibiotics therapy as prescribed by myself initially starting on July 30, 2018 and has been on that continuously up to this point. 08/30/18 on evaluation today patient actually appears to be doing much better in regard to her right lateral malleolus ulcer. She has been tolerating the dressing changes specifically the snap vac without complication although she did have some issues with the seal currently. Apparently there was some trouble with getting it to maintain over the past week past Sunday. Nonetheless overall the wound appears better in regard to the right lateral malleolus region. In regard to left lateral malleolus this actually show some signs of additional granulation although there still tendon noted in the base of the wound this appears to be healthy not necrotic in any way whatsoever. We are considering potentially using a snap vac for the left lateral malleolus as well the product wrap from [employer], [employer], was present in the clinic today we're going to see this patient I did  have her come in with me after obtaining consent from the patient and her daughter in order to look at the wound and see if there's any recommendation one way or another as to whether or not they felt the snapback could be beneficial for the left lateral malleolus region. But LEWIS, GRIVAS ([id number]) the conclusion was that it might be but that this is definitely a little bit deeper wound than what traditionally would be utilized for a snap vac. 09/06/18 on evaluation today patient actually appears to be doing excellent in my pinion in regard to both ankle ulcers. She has been tolerating the dressing changes without complication which is great news. Specifically we have been using the snap vac. In regard to the right ankle I'm not even sure that this is going to be necessary for today and following as the wound has filled in quite nicely. In regard to the left ankle I do believe that we're seeing excellent epithelialization from the edge as well as granulation in the central portion the tendon is still exposed but there's no evidence of necrotic bone and in general I feel like the patient has made excellent progress even compared to last week with just one week of the snap vac. 09/11/18; this is a patient who has wounds on her bilateral lateral malleoli. Initially both of these were deep stage IV wounds in the setting of chronic arterial insufficiency. She has been revascularized. As I understand think she been using snap vacs to both of these wounds however the area on the right became more superficial and currently she is only using it on the left. Using silver collagen on the right and silver collagen under the back on the left I believe 09/19/18 on evaluation today patient actually appears to be doing very well in regard to her lateral malleolus or ulcers bilaterally. She has been tolerating the dressing changes without complication. Fortunately there does not appear to be any evidence of  infection at this time. Overall I feel like she is improving in an excellent manner and I'm very pleased with the fact that everything seems to be turning towards the better for her. This has obviously been a long road. 09/27/18 on evaluation today patient actually appears to be doing very well in regard to her bilateral lateral malleolus ulcers. She has been tolerating the dressing changes without complication. Fortunately  there does not appear to be any evidence of infection at this time which is also great news. No fevers, chills, nausea, or vomiting noted at this time. Overall I feel like she is doing excellent with the snap vac on the left malleolus. She had 40 mL of fluid collection over the past week. 10/04/18 on evaluation today patient actually appears to be doing well in regard to her bilateral lateral malleolus ulcers. She continues to tolerate the dressing changes without complication. One issue that I see is the snap vac on the left lateral malleolus which appears to have sealed off some fluid underlying this area and has not really allowed it to heal to the degree that I would like to see. For that reason I did suggest at this point we may want to pack a small piece of packing strip into this region to allow it to more effectively wick out fluid. 10/11/18 in general the patient today does not feel that she has been doing very well. She's been a little bit lethargic and subsequently is having bodyaches as well according to what she tells me today. With that being said overall she has been concerned with the fact that something may be worsening although to be honest her wounds really have not been appearing poorly. She does have a new ulcer on her left heel unfortunately. This may be pressure related. Nonetheless it seems to me to have potentially started at least as a blister I do not see any evidence of deep tissue injury. In regard to the left ankle the snap vac still seems to be causing  the ceiling off of the deeper part of the wound which is in turn trapping fluid. I'm not extremely pleased with the overall appearance as far as progress from last week to this week therefore I'm gonna discontinue the snap vac at this point. 10/18/18 patient unfortunately this point has not been feeling well for the past several days. She was seen by Grayland Ormond her primary care provider who is a Librarian, academic at [employer]. Subsequently she states that she's been very weak and generally feeling malaise. No fevers, chills, nausea, or vomiting noted at this time. With that being said bloodwork was performed at the PCP office on the 11th of this month which showed a white blood cell count of 10.7. This was repeated today and shows a white blood cell count of 12.4. This does show signs of worsening. Coupled with the fact that she is feeling worse and that her left ankle wound is not really showing signs of improvement I feel like this is an indication that the osteomyelitis is likely exacerbating not improving. Overall I think we may also want to check her C-reactive protein and sedimentation rate. Actually did call [facility] office this afternoon while the patient was in the office here with me. Subsequently based on the findings we discussed treatment possibilities and I think that it is appropriate for [place] to go ahead and initiate treatment with doxycycline which I'm going to do. Subsequently he did agree to see about adding a CRP and sedimentation rate to her orders. If that has not already been drawn to where they can run it they will contact the patient she can come back to have that check. They are in agreement with plan as far as the patient and her daughter are concerned. Nonetheless also think we need to get in touch with Dr. Henreitta Leber office to see about getting the patient scheduled  with him as soon as possible. 11/08/18 on evaluation today patient presents for follow-up  concerning her bilateral foot and ankle ulcers. I did do an extensive review of her chart in epic today. Subsequently she was seen by [name] he did initiate Cefepime IV antibiotic therapy. Subsequently she had some issues with her PICC line this had to be removed because it was coiled and then replaced. Fortunately that was now settled. Unfortunately she has continued have issues with her left heel as well as the issues that she is experiencing with her bilateral lateral malleolus regions. I do believe however both areas seem to be doing a little bit better on evaluation today which is good news. No fevers, chills, nausea, or vomiting noted at this time. She actually has an angiogram schedule with Dr. dew on this coming Monday, November 11, 2018. Subsequently the patient states that she is feeling much better especially than what she was roughly 2 weeks ago. She actually had to cancel an appointment because she was feeling so poorly. No fevers, chills, nausea, or vomiting noted at this time. 11/15/18 on evaluation today patient actually is status post having had her angiogram with Dr. dew Monday, four days ago. 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 He had to go and work on several areas of the vasculature fortunately he was able to obtain no more than a 30% residual stenosis throughout post procedure. I reviewed this note today. I think this will definitely help with healing at this time. Fortunately there does not appear to be any signs of infection and I do feel like ratio already has a better appearance to it. 11/22/18 upon evaluation today patient actually appears to be doing very well in regard to her wounds in general. The right lateral malleolus looks excellent the heel looks better in the left lateral malleolus also appears to be doing a little better. With that being said the right second toe actually appears to be open and training we been watching this is  been dry and stable but now is open. 12/03/2018 Seen today for follow-up and management of multiple bilateral lower extremity wounds. New pressure injury of the great toe which is closed at this time. Wound of the right distal second toe appears larger today with deep undermining and a pocket of fluid present within the undermining region. Left and right malleolus is wounds are stable today with no signs and symptoms of infection.Denies any needs or concerns during exam today. 12/13/18 on evaluation today patient appears to be doing somewhat better in regard to her left heel ulcer. She also seems to be completely healed in regard to the right lateral malleolus ulcer. The left malleolus ulcer is smaller what unfortunately the wounds which are new over the first and second toes of the right foot are what are most concerning at this point especially the second. Both areas did require sharp debridement today. 12/20/18 on evaluation today patient's wound actually appears to be doing better in regard to left lateral ankle and her right lateral ankle continues to remain healed. The hill ulcer on the left is improved. She does have improvement noted as well in regard to both toe ulcers. Overall I'm very pleased in this regard. No fevers, chills, nausea, or vomiting noted at this time. KENNIYA, WESTRICH ([id number]) 12/23/18 on evaluation today patient is seen after she had her toenails trimmed at the podiatrist office due to issues with her right great  toe. There was what appeared to be dark eschar on the surface of the wound which had her in the podiatrist concerned. Nonetheless as I remember that during the last office visit I had utilize silver nitrate of this area I was much less concerned about the situation. Subsequently I was able to clean off much of this tissue without any complication today. This does not appear to show any signs of infection and actually look somewhat better compared to last time  post debridement. Her second toe on the right foot actually had callous over and there did appear still be some fluid underneath this that would require debridement today. 12/27/18 on evaluation today patient actually appears to be showing signs of improvement at all locations. Even the left lateral ankle although this is not quite as great as the other sites. Fortunately there does not appear to be any signs of infection at this time and both of her toes on the right foot seem to be showing signs of improvement which is good news and very pleased in this regard. 01/03/19 on evaluation today patient appears to be doing better for the most part in regard to her wounds in particular. There does not appear to be any evidence of infection at this time which is good news. Fortunately there is no sign of really worsening anywhere except for the right great toe which she does have what appears to be a bruise/deep tissue injury which is very superficial and already resolving. I'm not sure where this came from I questioned her extensively and she does not recall what may have happened with this. Other than that the patient seems to be doing well even the left lateral ankle ulcer looks good and is getting smaller. 01/10/19 on evaluation today patient appears to be doing well in regard to her left heel wound and both of her toe wounds. Overall I feel like there is definitely improvement here and I'm happy in that regard. With that being said unfortunately she is having issues with the left lateral malleolus ulcer which unfortunately still has a lot of depth to it. This is gonna be a very difficult wound for [place] to be able to truly get to heal. I may want to consider some type of skin substitute to see if this would be of benefit for her. I'll discuss this with her more the next visit most likely. This was something I thought about more at the end of the visit when I was Artie out of the room and the patient had been  discharged. 01/17/19 on evaluation today patient appears to be doing very well in regard to her wounds in general. She's been making excellent progress at this time. Fortunately there's no sign of infection at this time either. No fevers, chills, nausea, or vomiting noted at this time. The biggest issue is still her left lateral malleolus where it appears to be doing well and is getting smaller but still shows a small corner where this is deeper and goes down into what appears to be the joint space. Nonetheless this is taking much longer to heal although it still looks better in smaller than previous evaluations. 01/24/19 on evaluation today patient's wounds actually appear to be doing rather well in general overall. She did require some sharp debridement in regard to the right great toe but everything else appears to be doing excellent no debridement was even necessary. No fevers, chills, nausea, or vomiting noted at this time. 01/31/19 on evaluation today  patient actually appears to be doing much better in regard to her left foot wound on the heel as well as the ankle. The right great toe appears to be a little bit worse today this had callous over and trapped a lot of fluid underneath. Fortunately there's no signs of infection at any site which is great news. 02/07/19 on evaluation today patient actually appears to be doing decently well in regard to all of her ulcers at this point. No sharp debridement was required she is a little bit of hyper granulation in regard to the left lateral ankle as well as the left heel but the hill itself is almost completely healed which is excellent news. Overall been very pleased in this regard. 02/14/19 on evaluation today patient actually appears to be doing very well in regard to her ulcers on the right first toe, left lateral malleolus, and left heel. In fact the heel is almost completely healed at this point. The patient does not show any signs of infection which  is good news. Overall very pleased with how things have progressed. 04/18/19 Telehealth Evaluation During the COVID-19 National Emergency: Verbal Consent: Obtained from patient Allergies: reviewed and the active list is current. Medication changes: patient has no current medication changes. COVID-19 Screening: 1. Have you traveled internationally or on a cruise ship in the last 14 dayso No 2. Have you had contact with someone with or under investigation for COVID-19o No 3. Have you had a fever, cough, sore throat, or experiencing shortness of breatho No on evaluation today actually did have a visit with this patient through a telehealth encounter with her home health nurse. Subsequently it was noted that the patient actually appears to be doing okay in regard to her wounds both the right great toe as well as the left lateral malleolus have shown signs of improvement although this in your theme around the left lateral malleolus there eschar coverings for both locations. The question is whether or not they are actually close and whether or not home health needs to discharge the patient or not. Nonetheless my concern is this point obviously is that without actually seeing her and being able to evaluate this directly I cannot ensure that she is completely healed which is the question that I'm being asked. 04/22/19 on evaluation today patient presents for her first evaluation since last time I saw her which was actually February 14, 2019. I did do a telehealth visit last week in which point it was questionable whether or not she may be healed and had to bring her in today for confirmation. With that being said she does seem to be doing quite well at this point which is good news. There does not appear to be any drainage in the deed I believe her wounds may be healed. Readmission: 09/04/2019 on evaluation today patient appears to be doing unfortunately somewhat more poorly in regard to her left foot ulcer  secondary to a wound that began on 08/21/2019 at least when she first noticed this. Fortunately she has not had any evidence of active infection at this time. Systemically. I also do not necessarily see any evidence of infection at the blister/wound site on the first metatarsal head plantar aspect. This almost appears to be something that may have just rubbed inappropriately causing this to breakdown. They did not want a wait too long to come in to be seen as again she had significant issues in the past with wounds that took quite a while  to heal in fact it was close to 2 years. Nonetheless this does not appear to be quite that bad but again we do need to remove some of the necrotic tissue from the surface of the wound to tell exactly the extent. She does not appear to have any significant arterial disease at this point and again her last ABIs and TBI's are recorded above in the alert section her left ABI was 1.27 with a TBI of 0.72 to the right ABI 1.08 with a TBI of 0.39. Other than this the patient has been doing quite KAIREE, KOZMA ([id number]) well since I last saw her and that was in May 2020. 09/11/2019 on evaluation today patient appeared to be doing very well with regard to her plantar foot ulcer on the left. In fact this appears to be almost completely healed which is awesome. That is after just 1 week of intervention. With that being said there is no signs of active infection at this time. 09/18/2019 on evaluation today patient actually appears to be doing excellent in fact she is completely healed based on what I am seeing at this point. Fortunately there is no signs of active infection at this time and overall patient is very pleased to hear that this area has healed so quickly. Readmission: 05/13/2021 upon evaluation today this patient presents for reevaluation here in the clinic. This is a wound that actually we previously took care of. She had 1 on the right ankle and the left the  left turned out to be be harder due to to heal but nonetheless is doing great at this point as the right that has reopened and it was noted first just several weeks ago with a scab over it and came off in just the past few days. Fortunately there does not appear to be any obvious evidence of significant active infection at this time which is great news. No fevers, chills, nausea, vomiting, or diarrhea. 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 There does not appear to be any signs of this interfering in any way with her wound. She does have swelling we previously had compression socks for her ordered but again it does not look like she wears these on a regular basis by any means. 05/26/2021 upon evaluation today patient appears to be doing well with regard to her wound which is actually showing signs of excellent improvement. There does not appear to be any signs of active infection which is great news and overall very pleased with where things stand today. No fevers, chills, nausea, vomiting, or diarrhea. 06/02/2021 upon evaluation today patient's wound actually showing signs of excellent improvement. Fortunately there does not appear to be any signs of active infection which is great news. I think the patient is making good progress with regard to her wounds in general. 06/09/2021 upon evaluation today patient appears to be doing excellent in regard to her wounds currently. Fortunately there does not appear to be any signs of active infection which is great news. No fevers, chills, nausea, vomiting, or diarrhea. Overall extremely pleased with where things stand today. I think the patient is making excellent progress. 06/16/2021 upon evaluation today patient appears to be doing well in regard to her wound. This is going require little bit of debridement today and that was discussed with the patient. Otherwise she seems to be doing quite well and I am  actually very pleased with where things stand at  this point. No fevers, chills, nausea, vomiting, or diarrhea. 06/23/2021 upon evaluation today patient appears to be doing well with regard to her wounds. She has been tolerating the dressing changes without complication. Fortunately there does not appear to be any evidence of infection and she has not had air in her home which she actually lives at an assisted living that got fixed this morning. With that being said because of that her wrap has been extremely hot and bothersome for her over the past week. 06/30/2021 upon evaluation today patient is actually making excellent progress in regard to her ankle ulcer. She has been tolerating the dressing changes without complication and overall extremely pleased with where things stand there does not appear to be any evidence of active infection which is great news. No fevers, chills, nausea, vomiting, or diarrhea. 07/07/21 upon evaluation today patients and culture on the right actually appears to be doing quite well. There does not appear to be any signs of infection and overall very pleased with where things stand today. No fevers, chills, nausea, or vomiting noted at this time. 07/14/2021 unfortunately the patient today has some evidence of deep tissue injury and pressure getting to the ankle region. Again I am not exactly sure what is going on here but this is very similar to issues that we have had in the past. I explained to the patient that she needs to be very mindful of exactly what is happening I think sleeping in bed is probably the main issue here although there could be other culprits I am not sure what else would potentially lead to this kind of a problem for her. 07/21/2021 upon evaluation today patient's wound actually showing signs of improvement compared to last week. Fortunately there does not appear to be any signs of active infection which is great news and overall very pleased with where  things stand in that regard. With that being said I do believe that she is continuing to show signs of overall of getting better although I think this is still basically about what we were 2 weeks ago due to the worsening and now improvement. 07/28/2021 upon evaluation today patient appears to be doing well with regard to her wound. She does have some slough buildup on the surface of the wound which I would have to manage today. Fortunately there is no sign of active infection at this time. No fevers, chills, nausea, vomiting, or diarrhea. 08/04/2021 upon evaluation today patient appears to be doing about the same in regard to her wound. To be perfectly honest I am beginning to be a little bit concerned about the overall appearance of the wound bed. I do think possibly taking a sample right around the margin of the wound could be beneficial for her as far as identifying anything such as an inflammatory process or to be honest even a skin tag cancer type process that may be of concern here. Fortunately there does not appear to be any evidence of active infection at this time which is great news she is not having any pain also great news. 08/11/2021 upon evaluation today patient appears to be doing well with regard to her wound. The good news is I did review her biopsy results and it showed some inflammatory mixed findings but nothing that appeared to be malignant which is great news. Overall this is more of a chronic venous stasis type issue which again is more what we have been treating. Nonetheless I just wanted to make sure  before going forward that there was not anything more untoward going on at this point. 08/18/2021 upon evaluation today patient appears to be doing well with regard to her ankle ulcer. Fortunately there does not appear to be any signs of active infection at this time which is great overall wound is dramatically improved compared to last week. Since last week I have actually placed  her on doxycycline and subsequently this is a good option as far as the findings are concerned at this point. 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 the doxycycline is doing that. There does not appear to be any evidence of active infection systemically which is great news. 08/25/2021 upon evaluation today patient appears to be doing well with regard to her wound. I feel like we are finally get back on track as far as healing is concerned I am much happier with the overall appearance today. I do think that she is tolerating the dressing changes without complication which is great news. We have been using Hydrofera Blue which I think is a good option. The good news is she is also doing great in Zimmerman, SAREN CORKERN. ([id number]) regard to her compression sock on the left which is a zipper compression that seems to be doing a great job keeping her edema under good control. 09/01/2021 upon evaluation today patient appears to be doing well with regard to her wound. Infection seems to be under much better control which is great news and very pleased in that regard. Fortunately there does not appear to be any signs of infection currently. 09/08/2021 upon evaluation today patient actually appears to be making good progress in regard to her wound. She has been tolerating the dressing changes without complication. Fortunately there does not appear to be any evidence of active infection at this time which is great news as well. No fevers, chills, nausea, vomiting, or diarrhea. 09/22/2021 upon evaluation today patient appears to be doing well with regard to her wound although is very slow to heal. We have not looked into Apligraf yet I think that is something that we should see about doing. 09/29/2021 upon evaluation today patient's wound is actually showing signs of doing about the same.  I am not seeing any evidence of worsening but also no significant evidence of improvement. We did gain approval for the organogenesis products all except for Apligraf as covered by her insurance. With that being said I do think that we can go ahead and proceed with the NuShield if the patient and her family in agreement of the plan I discussed that with him today she does have a 20% coinsurance which we also discussed. 10/06/2021 upon evaluation today patient appears to unfortunately be doing a little bit worse she appears to be infected based on what I am seeing. Fortunately there does not appear to be any signs of active infection at this time which is great news. Unfortunately it does appear to be some evidence of around the wound edge indicated by way of erythema and warmth as well as redness 10/13/2021 upon evaluation today patient actually appears to be doing excellent in regard to her ankle ulcer compared to what it was. Fortunately though she does have evidence of infection, MRSA, on the culture which I did review this overall should be managed by the antibiotic that I given her which was the  doxycycline and again today this seems to be doing much better. I think were fine to go ahead and apply the NuShield today. 10/20/2021 upon evaluation today patient appears to be doing excellent in fact the NuShield seems to have done all some for her thus far. I am actually very pleased with where we stand and overall I think that she is making great progress. There is no evidence of active infection at this time. 11/23; patient presents for follow-up. She has no issues or complaints today. She denies signs of infection. She reports stability in her wound healing. 11/03/2021 upon evaluation today patient appears to be doing well with regard to her wounds. She has been tolerating the dressing changes without complication. Fortunately there is no signs of active infection at this time. 11/10/2021 upon  evaluation today patient appears to be doing well with regard to her right ankle which is actually showing signs of improvement with the NuShield I am very pleased. Subsequently in regards to the left ankle this appears to be doing okay with no evidence of issue here either. With that being said she has a significant contusion on the left leg from having fallen 2 days ago. She tells me that she was using her walker going to the closet and then when she got to the closet turned around to get something out at which point she fell according to the story. Nonetheless she does have a hematoma just below her knee on the anterior portion of her shin. My hope is that this will not open until wound although we do need I think Some compression over it also think that we need to have her use an ice as well to help with the swelling and prevent this from getting worse. The last thing she needs is a wound opened up here. 11/17/2021 upon evaluation patient's right ankle actually showing signs of improvement based on what I see currently there is a lot of new skin growth coming in which is great news. Nonetheless I do feel like that the patient is showing improvement as well in regard to her left leg with the use of the Tubigrip it swollen but not as bruised as what I would have expected after what I was seeing last week. Nonetheless I do think doubling up on the Tubigrip would probably be beneficial to try to keep some of the edema under better control here. 11/24/2021 upon evaluation today patient appears to be doing excellent in regard to her wound. This is actually measuring significantly smaller which is great news. Fortunately I do not see any signs of active infection locally nor systemically at this point. Overall I am very pleased with how the NuShield is doing. 12/30; wound bed looks healthy however not much change in overall wound volume. We applied Nushield again today in the standard fashion 12/08/2021  upon evaluation today patient's wound actually showing signs of good improvement and I am actually very pleased with where we stand today as well. I do not see any evidence of active infection locally nor systemically which is great news. Unfortunately she has been having some issues with stomach upset and diarrhea today. 12/15/2021 upon evaluation today patient appears to be doing excellent in regard to her wound. There is a little bit of dry skin raised up around the edges of the wound but fortunately nothing too significant at this point. Fortunately I do not see any evidence of active infection either which is great news. No fevers, chills, nausea,  vomiting, or diarrhea. 12/29/2021 upon evaluation today patient appears to be doing well with regard to her wound. In general I feel like she is getting very close to complete resolution. I think that she is making good progress here as well. Overall I do not see any signs of infection and very little of this appears to actually be open. 01/12/2022 upon evaluation today patient appears to be doing a little bit worse in regard to her wound. It appears that the collagen actually trapped fluid underneath the collagen which got hard and subsequently caused the fluid to back up. This patient has made the wound appear to be larger than what it was previous. With that being said I do not see any signs of obvious infection is not warm to touch and not significantly erythematous either which is good news. Nonetheless I do think that we will need to continue to keep an eye on this. Probably not go put on antibiotic this week but I will be too far from doing so she is actually on a Z-Pak right now I do not want to really double up on antibiotics. 01/26/2022 upon evaluation today patient's wound is showing signs of improvement although this is slow to turn back around. Fortunately there does not appear to be any evidence of active infection locally or systemically which  is great news and overall I am extremely pleased with where we stand today. No fevers, chills, nausea, vomiting, or diarrhea. 02/02/2022 upon evaluation today patient appears to be doing well with regard to her wound. She has been tolerating the dressing changes without complication. Fortunately there does not appear to be any signs of active infection locally nor systemically at this time which is great news. No fevers, chills, nausea, vomiting, or diarrhea. LORYN, HAACKE ([id number]) 3/9; patient presents for follow-up. She has no issues or complaints today. She denies signs of infection. 02/16/2022 upon evaluation today patient appears to be doing well with regard to her wound. She has been tolerating the dressing changes without complication. Fortunately I do not see any evidence of active infection locally nor systemically at this point which is great news. Overall I think that the patient is making excellent progress in general. The wound is measuring smaller and though there does appear to be some need for sharp debridement today this is minimal compared to what we have noted previous. 02/23/2022 upon evaluation today patient appears to be doing well with regard to her wound she is definitely showing signs of improvement which is great news. 03/02/2022 upon evaluation today patient appears to be doing about the same in regard to her wound. Unfortunately were not seeing significant improvement. With that being said is also not significantly worse which is great news. No fevers, chills, nausea, vomiting, or diarrhea. 03-09-2022 upon evaluation today patient appears to be doing well with regard to her wound. She has been tolerating the dressing changes without complication. Fortunately there does not appear to be any signs of active infection locally or systemically which is great news. 03-16-2022 upon evaluation today patient appears to be doing well with regard to her wound this is measuring  smaller and looking much better. I am actually very pleased with where we stand and I think that the patient is making great progress. I do not see any evidence of active infection locally or systemically which is great news. No fevers, chills, nausea, vomiting, or diarrhea. 03-23-2022 upon evaluation today patient appears to be doing better in regard to  her wounds. In fact the wound area actually is showing signs of excellent improvement and actually very pleased with where we stand today. I do not see any evidence of active infection locally nor systemically which is great news. 4/27; comes in today with again thick callus around the wound circumference which I removed with a curette. Some debris on the surface. Overall I do not think quite as good as it was last week by description. Using endoform 04-06-2022 upon evaluation today patient appears to be doing pretty well in regard to her wound. Fortunately I do not see any evidence of active infection locally or systemically which is great news and overall I am pleased in that regard. I do feel like this is measuring smaller postdebridement compared to where we were previous. Overall she is headed in the right direction. She does have an area that is threatening to open and looks a little irritated on the left ankle we did measure this just for the discolored area for now its not draining too much but we want to monitor and make sure nothing worsens here. 04-13-2022 upon evaluation today patient appears to be doing better in regard to her wound although this is still very slow to heal. We have reapplied for a skin substitute but have not heard anything back as of yet. Fortunately I do not see any evidence of active infection locally or systemically at this time which is great news. 04-20-2022 upon evaluation today patient's wound is actually showing signs of significant improvement which is great news. We did get approval for skin substitutes which I  think is an option here for [place] but at the same time I am not even certain that this is going to be necessary especially considering how well things seem to be doing at this point. If she continues healing as we see her right now but I think were probably going to be able to avoid any need for additional skin substitute. Patient and her daughter are both in agreement with that plan. With that being said she did have a fall she has several areas of contusion fortunately nothing that is can require intervention at this point but nonetheless she did also fracture her rib which is extremely uncomfortable obviously. 04-27-2022 upon evaluation today patient appears to be doing well currently in regard to her wound on the right lateral ankle. Overall I feel like she is actually doing significantly better even compared to last week and very pleased in this regard. I think we are on the right track here. 05-04-2022 upon evaluation today patient appears to be doing well with regard to her wound. She is going require some sharp debridement today to clear away some of the necrotic debris. Fortunately I was able to do this quite easily without significant issue or breakdown here. There is a very small area of opening centrally but really this is pretty close to getting close. 05-11-2022 upon evaluation today patient's wound is actually showing signs of excellent improvement. I do not see any evidence of infection locally or systemically at this time. Overall I think she is very close to complete resolution with just a little bit of debridement to clear away some of the dry crusty area around the edges of the wound currently. 05-18-2022 upon evaluation patient's wound bed actually appears to be likely healed although I cannot be 100% sure I am extremely pleased however with where things stand today. I do not see any evidence of infection locally or  systemically which is great news and overall I think you are on the right  track here. 05-25-2022 upon evaluation today patient appears to be doing well with regard to her leg ulcer. Fortunately there does not appear to be any signs of active infection locally or systemically at this time which is great news. No fevers, chills, nausea, vomiting, or diarrhea. With that being said she does appear to be healed in regard to her right lateral ankle which is great news. In regard to her leg unfortunately during the removal of the compression wrap there was a small skin tear that occurred with the scissors. Subsequently the patient does have a minimal amount of bleeding this is going require a couple Steri-Strips in order to seal this up. I do think it should likely heal quite well. Obviously this was unintentional. Nonetheless we do have to definitely monitor make sure that this completely heals up as well I am hopeful that we will be the case by next week to be honest. 06-01-2022 upon evaluation today patient appears to be doing excellent in regard to her wounds. Both areas are completely healed which is great news. Fortunately I do not see any signs of active infection locally or systemically at this time which is great and overall I am extremely pleased in that regard. No fevers, chills, nausea, vomiting, or diarrhea. 7/25; patient returns to clinic today with a story that 2 to 3 days ago she was sitting in her chair and she noticed bleeding from her left lower leg lateral aspect. She denies any trauma. She used pressure and a Band-Aid to get it to stop and she arrives in clinic today in follow-up. She was discharged on 6/29 apparently to stockings although she did not have any on and I do not think she was actually wearing them. She has significant chronic venous insufficiency 07-04-2022 upon evaluation today patient presents for follow-up. In the clinic she actually was seen last week when I was on vacation due to an area on her leg which reopened unfortunately. With that being  said she does seem to be doing a little bit better but unfortunately this is still Alves, [name] ([id number]) giving her some trouble here. All of her other wounds are still closed which I was previously seen her for and that is great news. Objective Constitutional Well-nourished and well-hydrated in no acute distress. Vitals Time Taken: 10:59 AM, Height: 62 in, Weight: 150 lbs, BMI: 27.4, Temperature: 98 F, Pulse: 64 bpm, Respiratory Rate: 18 breaths/min, Blood Pressure: 110/63 mmHg. Respiratory normal breathing without difficulty. Psychiatric this patient is able to make decisions and demonstrates good insight into disease process. Alert and Oriented x 3. pleasant and cooperative. General Notes: Upon inspection patient's wound bed actually showed signs of good granulation and epithelization at this point. Fortunately I do not see any evidence of active infection locally or systemically which is great news and overall I am extremely pleased with where things stand currently. Integumentary (Hair, Skin) Wound #10 status is Open. Original cause of wound was Trauma. The date acquired was: 06/24/2022. The wound has been in treatment 1 weeks. The wound is located on the Left,Lateral Lower Leg. The wound measures 1.2cm length x 1.2cm width x 0.1cm depth; 1.131cm^2 area and 0.113cm^3 volume. There is Fat Layer (Subcutaneous Tissue) exposed. There is no tunneling or undermining noted. There is a large amount of serosanguineous drainage noted. The wound margin is flat and intact. There is small (1-33%) red granulation within the  wound bed. There is a large (67-100%) amount of necrotic tissue within the wound bed including Adherent Slough. Assessment Active Problems ICD-10 Non-pressure chronic ulcer of other part of left lower leg with other specified severity Contusion of left lower leg, subsequent encounter Lymphedema, not elsewhere classified Venous insufficiency (chronic)  (peripheral) Presence of cardiac pacemaker Long term (current) use of anticoagulants Procedures Wound #10 Pre-procedure diagnosis of Wound #10 is a Trauma, Other located on the Left,Lateral Lower Leg . There was a Three Layer Compression Therapy Procedure by Levora Dredge, RN. Post procedure Diagnosis Wound #10: Same as Pre-Procedure Notes: pt tolerated wrap without issue. Plan Discharge From [facility] Services: Wear compression garments daily. Put garments on first thing when you wake up and remove them before bed. - right leg Edema Control - Lymphedema / Segmental Compressive Device / Other: DURA, MCCORMACK ([id number]) 3 Layer Compression System for Lymphedema. - left lower leg Elevate legs to the level of the heart and pump ankles as often as possible WOUND #10: - Lower Leg Wound Laterality: Left, Lateral Cleanser: Byram Ancillary Kit - 15 Day Supply Discharge Instructions: Use supplies as instructed; Kit contains: (15) Saline Bullets; (15) 3x3 Gauze; 15 pr Gloves Cleanser: Soap and Water Discharge Instructions: Gently cleanse wound with antibacterial soap, rinse and pat dry prior to dressing wounds Primary Dressing: Prisma 4.34 (in) Discharge Instructions: Moisten w/normal saline or sterile water; Cover wound as directed. Do not remove from wound bed. Primary Dressing: Secondary Dressing: ABD Pad 5x9 (in/in) Discharge Instructions: Cover with ABD pad Secondary Dressing: hydrogel Compression Wrap: 3-LAYER WRAP - Profore Lite LF 3 Multilayer Compression Bandaging System Discharge Instructions: Apply 3 multi-layer wrap as prescribed. 1. I am going to recommend that we going continue with the wound care measures as before and the patient is in agreement with the plan. This includes the use of the silver collagen followed by an ABD pad. 2 also can continue with the 3 layer compression wrap which is helping to keep edema under control. 3. I am also going to recommend patient should  continue to monitor for any signs of worsening or infection. Obviously if anything changes she should let me know as soon as possible. We will see patient back for reevaluation in 1 week here in the clinic. If anything worsens or changes patient will contact our office for additional recommendations. Electronic Signature(s) Signed: 07/06/2022 6:10:20 PM By: Worthy Keeler PA-C Entered By: Worthy Keeler on 07/06/2022 18:10:20 Ransome, [name] ([id number]) -------------------------------------------------------------------------------- SuperBill Details Patient Name: [name] Date of Service: 07/04/2022 Medical Record Number: [id number] Patient Account Number: [id number] Date of Birth/Sex: 1923/04/09 (88 y.o. F) Treating RN: Levora Dredge Primary Care Provider: Adrian Prows Other Clinician: Referring Provider: Adrian Prows Treating Provider/Extender: Skipper Cliche in Treatment: 59 Diagnosis Coding ICD-10 Codes Code Description [phone] Non-pressure chronic ulcer of other part of left lower leg with other specified severity S80.12XD Contusion of left lower leg, subsequent encounter I89.0 Lymphedema, not elsewhere classified I87.2 Venous insufficiency (chronic) (peripheral) Z95.0 Presence of cardiac pacemaker Z79.01 Long term (current) use of anticoagulants Facility Procedures CPT4 Code: 62831517 Description: (Facility Use Only) 29581LT - APPLY MULTLAY COMPRS LWR LT LEG Modifier: Quantity: 1 Physician Procedures CPT4 Code: 6160737 Description: 99213 - WC PHYS LEVEL 3 - EST PT Modifier: Quantity: 1 CPT4 Code: Description: ICD-10 Diagnosis Description L97.828 Non-pressure chronic ulcer of other part of left lower leg with other spe S80.12XD Contusion of left lower leg, subsequent encounter I89.0 Lymphedema, not elsewhere classified I87.2  Venous insufficiency  (chronic) (peripheral) Modifier: cified severity Quantity: Electronic Signature(s) Signed:  07/06/2022 6:10:40 PM By: Worthy Keeler PA-C Previous Signature: 07/04/2022 1:26:57 PM Version By: Levora Dredge Entered By: Worthy Keeler on 07/06/2022 18:10:40

## 2022-07-11 ENCOUNTER — Other Ambulatory Visit: Payer: Self-pay | Admitting: Neurosurgery

## 2022-07-13 ENCOUNTER — Ambulatory Visit: Payer: Medicare Other | Admitting: Physician Assistant

## 2022-07-13 ENCOUNTER — Encounter: Payer: Medicare Other | Admitting: Physician Assistant

## 2022-07-13 DIAGNOSIS — L97828 Non-pressure chronic ulcer of other part of left lower leg with other specified severity: Secondary | ICD-10-CM | POA: Diagnosis not present

## 2022-07-13 NOTE — Progress Notes (Addendum)
[name] [name] ([id number]) Visit Report for 07/13/2022 Chief Complaint Document Details Patient Name: [name], [name]. Date of Service: 07/13/2022 1:15 PM Medical Record Number: [id number] Patient Account Number: [id number] Date of Birth/Sex: [birth year] (88 y.o. F) Treating RN: Carlene Coria Primary Care Provider: Adrian Prows Other Clinician: Referring Provider: Adrian Prows Treating Provider/Extender: Skipper Cliche in Treatment: 49 Information Obtained from: Patient Chief Complaint Left leg ulcer and left heel blister/deep tissue injury Electronic Signature(s) Signed: 07/13/2022 2:17:42 PM By: Worthy Keeler PA-C Previous Signature: 07/13/2022 1:47:49 PM Version By: Worthy Keeler PA-C Entered By: Worthy Keeler on 07/13/2022 14:17:42 [name], [name] [name] ([id number]) -------------------------------------------------------------------------------- HPI Details Patient Name: [name] [name] Date of Service: 07/13/2022 1:15 PM Medical Record Number: [id number] Patient Account Number: [id number] Date of Birth/Sex: 1923/03/24 (88 y.o. 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 Dr. Amalia Hailey the podiatrist saw her on 07/20/2017 and sent her to the wound center for possible hyperbaric oxygen therapy. past medical history of peripheral vascular disease, varicose veins, status post appendectomy, basal cell carcinoma excision from the left leg, cholecystectomy, pacemaker placement, right lower extremity angiography done by Dr. dew in March 2017 with placement of a stent. there is also note of a  successful ablation of the right small saphenous vein done which was reviewed by ultrasound on 10/24/2016. the patient had a right small saphenous vein ablation done on 10/20/2016. The patient has never been a smoker. She has been seen by Dr. Corene Cornea dew the vascular surgeon who most recently saw her on 06/15/2017 for evaluation of ongoing problems with right leg swelling. She had a lower extremity arterial duplex examination done(02/13/17) which showed patent distal right superficial femoral artery stent and above-the-knee popliteal stent without evidence of restenosis. The ABI was more than 1.3 on the right and more than 1.3 on the left. This was consistent with noncompressible arteries due to medial calcification. The right great toe pressure and PPG waveforms are within normal limits and the left great toe pressure and PPG waveforms are decreased. he recommended she continue to wear her compression stockings and continue with elevation. She is scheduled to have a noninvasive arterial study in the near future 08/16/2017 -- had a lower extremity arterial duplex examination done which showed patent distal right superficial femoral artery stent and above-the-knee popliteal stent without evidence of restenosis. The ABI was more than 1.3 on the right and more than 1.3 on the left. This was consistent with noncompressible arteries due to medial calcification. The right great toe pressure and PPG waveforms are within normal limits and the left great toe pressure and PPG waveforms are decreased. the x-ray of the right ankle has not yet been done 08/24/2017 -- had a right ankle x-ray -- IMPRESSION:1. No fracture, bone lesion or evidence of osteomyelitis. 2. Lateral soft tissue swelling with a soft tissue ulcer. she has not yet seen the vascular surgeon for review 08/31/17 on evaluation today patient's wound appears to be showing signs of improvement. She still with her appointment with vascular in order to  review her results of her vascular study and then determine if any intervention would be recommended at that time. No fevers, chills, nausea, or vomiting noted  at this time. She has been tolerating the dressing changes without complication. 09/28/17 on evaluation today patient's wound appears to show signs of good improvement in regard to the granulation tissue which is surfacing. There is still a layer of slough covering the wound and the posterior portion is still significantly deeper than the anterior nonetheless there has been some good sign of things moving towards the better. She is going to go back to Dr. dew for reevaluation to ensure her blood flow is still appropriate. That will be before her next evaluation with [place] next week. No fevers, chills, nausea, or vomiting noted at this time. Patient does have some discomfort rated to be a 3-4/10 depending on activity specifically cleansing the wound makes it worse. 10/05/2017 -- the patient was seen by Dr. Lucky Cowboy last week and noninvasive studies showed a normal right ABI with brisk triphasic waveforms consistent with no arterial insufficiency including normal digital pressures. The duplex showed a patent distal right SFA stent and the proximal SFA was also normal. He was pleased with her test and thought she should have enough of perfusion for normal wound healing. He would see her back in 6 months time. 12/21/17 on evaluation today patient appears to be doing fairly well in regard to her right lateral ankle wound. Unfortunately the main issue that she is expansion at this point is that she is having some issues with what appears to be some cellulitis in the right anterior shin. She has also been noting a little bit of uncomfortable feeling especially last night and her ankle area. I'm afraid that she made the developing a little bit of an infection. With that being said I think it is in the early stages. 12/28/17 on evaluation today patient's ankle  appears to be doing excellent. She's making good progress at this point the cellulitis seems to have improved after last week's evaluation. Overall she is having no significant discomfort which is excellent news. She does have an appointment with Dr. dew on March 29, 2018 for reevaluation in regard to the stent he placed. She seems to have excellent blood flow in the right lower extremity. 01/19/12 on evaluation today patient's wound appears to be doing very well. In fact she does not appear to require debridement at this point, there's no evidence of infection, and overall from the standpoint of the wound she seems to be doing very well. With that being said I believe that it may be time to switch to different dressing away from the [facility] Dressing she tells me she does have a lot going on her friend actually passed away yesterday and she's also having a lot of issues with her husband this obviously is weighing heavy on her as far as your thoughts and concerns today. 01/25/18 on evaluation today patient appears to be doing fairly well in regard to her right lateral malleolus. She has been tolerating the dressing changes without complication. Overall I feel like this is definitely showing signs of improvement as far as how the overall appearance of the wound is there's also evidence of epithelium start to migrate over the granulation tissue. In general I think that she is progressing nicely as far as the wound is concerned. The only concern she really has is whether or not we can switch to every other week visits in order to avoid having as many appointments as her daughters have a difficult time getting her to her appointments as well as the patient's husband to his he  is not doing very well at this point. 02/22/18 on evaluation today patient's right lateral malleolus ulcer appears to be doing great. She has been tolerating the dressing changes without complication. Overall you making excellent  progress at this time. Patient is having no significant discomfort. [name], [name] ([id number]) 03/15/18 on evaluation today patient appears to be doing much more poorly in regard to her right lateral ankle ulcer at this point. Unfortunately since have last seen her her husband has passed just a few days ago is obviously weighed heavily on her her daughter also had surgery well she is with her today as usual. There does not appear to be any evidence of infection she does seem to have significant contusion/deep tissue injury to the right lateral malleolus which was not noted previous when I saw her last. It's hard to tell of exactly when this injury occurred although during the time she was spending the night in the hospital this may have been most likely. 03/22/18 on evaluation today patient appears to actually be doing very well in regard to her ulcer. She did unfortunately have a setback which was noted last week however the good news is we seem to be getting back on track and in fact the wound in the core did still have some necrotic tissue which will be addressed at this point today but in general I'm seeing signs that things are on the up and up. She is glad to hear this obviously she's been somewhat concerned that due to the how her wound digressed more recently. 03/29/18 on evaluation today patient appears to be doing fairly well in regard to her right lower extremity lateral malleolus ulcer. She unfortunately does have a new area of pressure injury over the inferior portion where the wound has opened up a little bit larger secondary to the pressure she seems to be getting. She does tell me sometimes when she sleeps at night that it actually hurts and does seem to be pushing on the area little bit more unfortunately. There does not appear to be any evidence of infection which is good news. She has been tolerating the dressing changes without complication. She also did have some bruising in the  left second and third toes due to the fact that she may have bump this or injured it although she has neuropathy so she does not feel she did move recently that may have been where this came from. Nonetheless there does not appear to be any evidence of infection at this time. 04/12/18 on evaluation today patient's wound on the right lateral ankle actually appears to be doing a little bit better with a lot of necrotic docking tissue centrally loosening up in clearing away. However she does have the beginnings of a deep tissue injury on the left lateral malleolus likely due to the fact we've been trying offload the right as much as we have. I think she may benefit from an assistive soft device to help with offloading and it looks like they're looking at one of the doughnut conditions that wraps around the lower leg to offload which I think will definitely do a good job. With that being said I think we definitely need to address this issue on the left before it becomes a wound. Patient is not having significant pain. 04/19/18 on evaluation today patient appears to be doing excellent in regard to the progress she's made with her right lateral ankle ulcer. The left ankle region which did show evidence of  a deep tissue injury seems to be resolving there's little fluid noted underneath and a blister there's nothing open at this point in time overall I feel like this is progressing nicely which is good news. She does not seem to be having significant discomfort at this point which is also good news. 04/25/18-She is here in follow up evaluation for bilateral lateral malleolar ulcers. The right lateral malleolus ulcer with pale subcutaneous tissue exposure, central area of ulcer with tendon/periosteum exposed. The left lateral malleolus ulcer now with central area of nonviable tissue, otherwise deep tissue injury. She is wearing compression wraps to the left lower extremity, she will place the right lower  extremity compression wraps on when she gets home. She will be out of town over the weekend and return next week and follow-up appointment. She completed her doxycycline this morning 05/03/18 on evaluation today patient appears to be doing very well in regard to her right lateral ankle ulcer in general. At least she's showing some signs of improvement in this regard. Unfortunately she has some additional injury to the left lateral malleolus region which appears to be new likely even over the past several days. Again this determination is based on the overall appearance. With that being said the patient is obviously frustrated about this currently. 05/10/18-She is here in follow-up evaluation for bilateral lateral malleolar ulcers. She states she has purchased offloading shoes/boots and they will arrive tomorrow. She was asked to bring them in the office at next week's appointment so her provider is aware of product being utilized. She continues to sleep on right or left side, she has been encouraged to sleep on her back. The right lateral malleolus ulcer is precariously close to peri-osteum; will order xray. The left lateral malleolus ulcer is improved. Will switch back to santyl; she will follow up next week. 05/17/18 on evaluation today patient actually appears to be doing very well in regard to her malleolus her ulcers compared to last time I saw them. She does not seem to have as much in the way of contusion at this point which is great news. With that being said she does continue to have discomfort and I do believe that she is still continuing to benefit from the offloading/pressure reducing boots that were recommended. I think this is the key to trying to get this to heal up completely. 05/24/18 on evaluation today patient actually appears to be doing worse at this point in time unfortunately compared to her last week's evaluation. She is having really no increased pain which is good news  unfortunately she does have more maceration in your theme and noted surrounding the right lateral ankle the left lateral ankle is not really is erythematous I do not see signs of the overt cellulitis on that side. Unfortunately the wounds do not seem to have shown any signs of improvement since the last evaluation. She also has significant swelling especially on the right compared to previous some of this may be due to infection however also think that she may be served better while she has these wounds by compression wrapping versus continuing to use the Juxta-Lite for the time being. Especially with the amount of drainage that she is experiencing at this point. No fevers, chills, nausea, or vomiting noted at this time. 05/31/18 on evaluation today patient appears to actually be doing better in regard to her right lateral lower extremity ulcer specifically on the malleolus region. She has been tolerating the antibiotic without complication. With that  being said she still continues to have issues but a little bit of redness although nothing like she what she was experiencing previous. She still continues to pressure to her ankle area she did get the problem on offloading boots unfortunately she will not wear them she states there too uncomfortable and she can't get in and out of the bed. Nonetheless at this point her wounds seem to be continually getting worse which is not what we want I'm getting somewhat concerned about her progress and how things are going to proceed if we do not intervene in some way shape or form. I therefore had a very lengthy conversation today about offloading yet again and even made a specific suggestion for switching her to a memory foam mattress and even gave the information for a specific one that they could look at getting if it was something that they were interested in considering. She does not want to be considered for a hospital bed air mattress although honestly  insurance would not cover it that she does not have any wounds on her trunk. 06/14/18 on evaluation today both wounds over the bilateral lateral malleolus her ulcers appear to be doing better there's no evidence of pressure injury at this point. She did get the foam mattress for her bed and this does seem to have been extremely beneficial for her in my pinion. Her daughter states that she is having difficulty getting out of bed because of how soft it is. The patient also relates this to be. Nonetheless I do feel like she's actually doing better. Unfortunately right after and around the time she was getting the mattress she also sustained a fall when she got up to go pick up the phone and ended up injuring her right elbow she has 18 sutures in place. We are not caring for this currently although home health is going to be taking the sutures out shortly. Nonetheless this may be something that we need to evaluate going forward. It depends on how well it has or has not healed in the end. She also recently saw an orthopedic specialist for an injection in the right shoulder just before her fall unfortunately the fall seems to have worsened her pain. 06/21/18 on evaluation today patient appears to be doing about the same in regard to her lateral malleolus ulcers. Both appear to be just a little bit deeper but again we are clinging away the necrotic and dead tissue which I think is why this is progressing towards a deeper realm as opposed [name], [name]. ([id number]) to improving from my measurement standpoint in that regard. Nonetheless she has been tolerating the dressing changes she absolutely hates the memory foam mattress topper that was obtained for her nonetheless I do believe this is still doing excellent as far as taking care of excess pressure in regard to the lateral malleolus regions. She in fact has no pressure injury that I see whereas in weeks past it was week by week I was constantly seeing  new pressure injuries. Overall I think it has been very beneficial for her. 07/03/18; patient arrives in my clinic today. She has deep punched out areas over her bilateral lateral malleoli. The area on the right has some more depth. We spent a lot of time today talking about pressure relief for these areas. This started when her daughter asked for a prescription for a memory foam mattress. I have never written a prescription for a mattress and I don't think insurances would  pay for that on an ordinary bed. In any case he came up that she has foam boots that she refuses to wear. I would suggest going to these before any other offloading issues when she is in bed. They say she is meticulous about offloading this the rest of the day 07/10/18- She is seen in follow-up evaluation for bilateral, lateral malleolus ulcers. There is no improvement in the ulcers. She has purchased and is sleeping on a memory foam mattress/overlay, she has been using the offloading boots nightly over the past week. She has a follow up appointment with vascular medicine at the end of October, in my opinion this follow up should be expedited given her deterioration and suboptimal TBI results. We will order plain film xray of the left ankle as deeper structures are palpable; would consider having MRI, regardless of xray report(s). The ulcers will be treated with iodoflex/iodosorb, she is unable to safely change the dressings daily with santyl. 07/19/18 on evaluation today patient appears to be doing in general visually well in regard to her bilateral lateral malleolus ulcers. She has been tolerating the dressing changes without complication which is good news. With that being said we did have an x-ray performed on 07/12/18 which revealed a slight loosen see in the lateral portion of the distal left fibula which may represent artifact but underline lytic destruction or osteomyelitis could not be excluded. MRI was recommended. With that  being said we can see about getting the patient scheduled for an MRI to further evaluate this area. In fact we have that scheduled currently for August 20 19,019. 07/26/18 on evaluation today patient's wound on the right lateral ankle actually appears to be doing fairly well at this point in my pinion. She has made some good progress currently. With that being said unfortunately in regard to the left lateral ankle ulcer this seems to be a little bit more problematic at this time. In fact as I further evaluated the situation she actually had bone exposed which is the first time that's been the case in the bone appear to be necrotic. Currently I did review patient's note from Dr. Bunnie Domino office with [facility]. He stated that ABI was 1.26 on the right and 0.95 on the left with good waveforms. Her perfusion is stable not reduced from previous studies and her digital waveforms were pretty good particularly on the right. His conclusion upon review of the note was that there was not much she could do to improve her perfusion and he felt she was adequate for wound healing. His suggestion was that she continued to see [place] and consider a synthetic skin graft if there was no underlying infection. He plans to see her back in six months or as needed. 08/01/18 on evaluation today patient appears to be doing better in regard to her right lateral ankle ulcer. Her left lateral ankle ulcer is about the same she still has bone involvement in evidence of necrosis. There does not appear to be evidence of infection at this time On the right lateral lower extremity. I have started her on the Augmentin she picked this up and started this yesterday. This is to get her through until she sees infectious disease which is scheduled for 08/12/18. 08/06/18 on evaluation today patient appears to be doing rather well considering my discussion with patient's daughter at the end of last week. The area which was marked  where she had erythema seems to be improved and this is  good news. With that being said overall the patient seems to be making good improvement when it comes to the overall appearance of the right lateral ankle ulcer although this has been slow she at least is coming around in this regard. Unfortunately in regard to the left lateral ankle ulcer this is osteomyelitis based on the pathology report as well is bone culture. Nonetheless we are still waiting CT scan. Unfortunately the MRI we originally ordered cannot be performed as the patient is a pacemaker which I had overlooked. Nonetheless we are working on the CT scan approval and scheduling as of now. She did go to the hospital over the weekend and was placed on IV Cefzo for a couple of days. Fortunately this seems to have improved the erythema quite significantly which is good news. There does not appear to be any evidence of worsening infection at this time. She did have some bleeding after the last debridement therefore I did not perform any sharp debridement in regard to left lateral ankle at this point. Patient has been approved for a snap vac for the right lateral ankle. 08/14/18; the patient with wounds over her bilateral lateral malleoli. The area on the right actually looks quite good. Been using a snap back on this area. Healthy granulation and appears to be filling in. Unfortunately the area on the left is really problematic. 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 Also noted to have cellulitis. She saw Dr. Novella Olive of infectious disease today and was put on linezolid. We are able to verify this with her pharmacy. She is completed the Augmentin that she was already on. We've been using Iodoflex to this area 08/23/18 on evaluation today patient's wounds both actually appear to be doing better compared to my prior evaluations. Fortunately she showing signs of  good improvement in regard to the overall wound status especially where were using the snap vac on the right. In regard to left lateral malleolus the wound bed actually appears to be much cleaner than previously noted. I do not feel any phone directly probed during evaluation today and though there is tendon noted this does not appear to be necrotic it's actually fairly good as far as the overall appearance of the tendon is concerned. In general the wound bed actually appears to be doing significantly better than it was previous. Patient is currently in the care of [name] and I did review that note today. He actually has her on two weeks of linezolid and then following the patient will be on 1-2 months of Keflex. That is the plan currently. She has been on antibiotics therapy as prescribed by myself initially starting on July 30, 2018 and has been on that continuously up to this point. 08/30/18 on evaluation today patient actually appears to be doing much better in regard to her right lateral malleolus ulcer. She has been tolerating the dressing changes specifically the snap vac without complication although she did have some issues with the seal currently. Apparently there was some trouble with getting it to maintain over the past week past Sunday. Nonetheless overall the wound appears better in regard to the right lateral malleolus region. In regard to left lateral malleolus this actually show some signs of additional granulation although there still tendon noted in the base of the wound this appears to be healthy not necrotic in any way whatsoever. We are considering potentially using a snap  vac for the left lateral malleolus as well the product wrap from [employer], was present in the clinic today we're going to see this patient I did have her come in with me after obtaining consent from the patient and her daughter in order to look at the wound and see if there's any recommendation one way  or another as to whether or not they felt the snapback could be beneficial for the left lateral malleolus region. But the conclusion was that it might be but that this is definitely a little bit deeper wound than what traditionally would be utilized for a snap vac. 09/06/18 on evaluation today patient actually appears to be doing excellent in my pinion in regard to both ankle ulcers. She has been tolerating the dressing changes without complication which is great news. Specifically we have been using the snap vac. In regard to the right ankle I'm not even sure that this is going to be necessary for today and following as the wound has filled in quite nicely. In regard to the left ankle I do believe [name], [name] ([id number]) that we're seeing excellent epithelialization from the edge as well as granulation in the central portion the tendon is still exposed but there's no evidence of necrotic bone and in general I feel like the patient has made excellent progress even compared to last week with just one week of the snap vac. 09/11/18; this is a patient who has wounds on her bilateral lateral malleoli. Initially both of these were deep stage IV wounds in the setting of chronic arterial insufficiency. She has been revascularized. As I understand think she been using snap vacs to both of these wounds however the area on the right became more superficial and currently she is only using it on the left. Using silver collagen on the right and silver collagen under the back on the left I believe 09/19/18 on evaluation today patient actually appears to be doing very well in regard to her lateral malleolus or ulcers bilaterally. She has been tolerating the dressing changes without complication. Fortunately there does not appear to be any evidence of infection at this time. Overall I feel like she is improving in an excellent manner and I'm very pleased with the fact that everything seems to be turning towards  the better for her. This has obviously been a long road. 09/27/18 on evaluation today patient actually appears to be doing very well in regard to her bilateral lateral malleolus ulcers. She has been tolerating the dressing changes without complication. Fortunately there does not appear to be any evidence of infection at this time which is also great news. No fevers, chills, nausea, or vomiting noted at this time. Overall I feel like she is doing excellent with the snap vac on the left malleolus. She had 40 mL of fluid collection over the past week. 10/04/18 on evaluation today patient actually appears to be doing well in regard to her bilateral lateral malleolus ulcers. She continues to tolerate the dressing changes without complication. One issue that I see is the snap vac on the left lateral malleolus which appears to have sealed off some fluid underlying this area and has not really allowed it to heal to the degree that I would like to see. For that reason I did suggest at this point we may want to pack a small piece of packing strip into this region to allow it to more effectively wick out fluid. 10/11/18 in general the patient today  does not feel that she has been doing very well. She's been a little bit lethargic and subsequently is having bodyaches as well according to what she tells me today. With that being said overall she has been concerned with the fact that something may be worsening although to be honest her wounds really have not been appearing poorly. She does have a new ulcer on her left heel unfortunately. This may be pressure related. Nonetheless it seems to me to have potentially started at least as a blister I do not see any evidence of deep tissue injury. In regard to the left ankle the snap vac still seems to be causing the ceiling off of the deeper part of the wound which is in turn trapping fluid. I'm not extremely pleased with the overall appearance as far as progress from last  week to this week therefore I'm gonna discontinue the snap vac at this point. 10/18/18 patient unfortunately this point has not been feeling well for the past several days. She was seen by Grayland Ormond her primary care provider who is a Librarian, academic at [facility]. Subsequently she states that she's been very weak and generally feeling malaise. No fevers, chills, nausea, or vomiting noted at this time. With that being said bloodwork was performed at the PCP office on the 11th of this month which showed a white blood cell count of 10.7. This was repeated today and shows a white blood cell count of 12.4. This does show signs of worsening. Coupled with the fact that she is feeling worse and that her left ankle wound is not really showing signs of improvement I feel like this is an indication that the osteomyelitis is likely exacerbating not improving. Overall I think we may also want to check her C-reactive protein and sedimentation rate. Actually did call [facility] office this afternoon while the patient was in the office here with me. Subsequently based on the findings we discussed treatment possibilities and I think that it is appropriate for [place] to go ahead and initiate treatment with doxycycline which I'm going to do. Subsequently he did agree to see about adding a CRP and sedimentation rate to her orders. If that has not already been drawn to where they can run it they will contact the patient she can come back to have that check. They are in agreement with plan as far as the patient and her daughter are concerned. Nonetheless also think we need to get in touch with Dr. Henreitta Leber office to see about getting the patient scheduled with him as soon as possible. 11/08/18 on evaluation today patient presents for follow-up concerning her bilateral foot and ankle ulcers. I did do an extensive review of her chart in epic today. Subsequently she was seen by [name] he did initiate Cefepime  IV antibiotic therapy. Subsequently she had some issues with her PICC line this had to be removed because it was coiled and then replaced. Fortunately that was now settled. Unfortunately she has continued have issues with her left heel as well as the issues that she is experiencing with her bilateral lateral malleolus regions. I do believe however both areas seem to be doing a little bit better on evaluation today which is good news. No fevers, chills, nausea, or vomiting noted at this time. She actually has an angiogram schedule with Dr. dew on this coming Monday, November 11, 2018. Subsequently the patient states that she is feeling much better especially than what she was roughly 2  weeks ago. She actually had to cancel an appointment because she was feeling so poorly. No fevers, chills, nausea, or vomiting noted at this time. 11/15/18 on evaluation today patient actually is status post having had her angiogram with Dr. dew Monday, four days ago. 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 He had to go and work on several areas of the vasculature fortunately he was able to obtain no more than a 30% residual stenosis throughout post procedure. I reviewed this note today. I think this will definitely help with healing at this time. Fortunately there does not appear to be any signs of infection and I do feel like ratio already has a better appearance to it. 11/22/18 upon evaluation today patient actually appears to be doing very well in regard to her wounds in general. The right lateral malleolus looks excellent the heel looks better in the left lateral malleolus also appears to be doing a little better. With that being said the right second toe actually appears to be open and training we been watching this is been dry and stable but now is open. 12/03/2018 Seen today for follow-up and management of multiple bilateral lower extremity wounds. New pressure injury of the great toe  which is closed at this time. Wound of the right distal second toe appears larger today with deep undermining and a pocket of fluid present within the undermining region. Left and right malleolus is wounds are stable today with no signs and symptoms of infection.Denies any needs or concerns during exam today. 12/13/18 on evaluation today patient appears to be doing somewhat better in regard to her left heel ulcer. She also seems to be completely healed in regard to the right lateral malleolus ulcer. The left malleolus ulcer is smaller what unfortunately the wounds which are new over the first and second toes of the right foot are what are most concerning at this point especially the second. Both areas did require sharp debridement today. 12/20/18 on evaluation today patient's wound actually appears to be doing better in regard to left lateral ankle and her right lateral ankle continues to remain healed. The hill ulcer on the left is improved. She does have improvement noted as well in regard to both toe ulcers. Overall I'm very pleased in this regard. No fevers, chills, nausea, or vomiting noted at this time. 12/23/18 on evaluation today patient is seen after she had her toenails trimmed at the podiatrist office due to issues with her right great toe. There was what appeared to be dark eschar on the surface of the wound which had her in the podiatrist concerned. Nonetheless as I remember that during the last office visit I had utilize silver nitrate of this area I was much less concerned about the situation. Subsequently I was able to clean off much of this tissue without any complication today. This does not appear to show any signs of infection and actually look somewhat better [name], [name] [name] ([id number]) compared to last time post debridement. Her second toe on the right foot actually had callous over and there did appear still be some fluid underneath this that would require debridement  today. 12/27/18 on evaluation today patient actually appears to be showing signs of improvement at all locations. Even the left lateral ankle although this is not quite as great as the other sites. Fortunately there does not appear to be any signs of infection at this time and both of her toes on  the right foot seem to be showing signs of improvement which is good news and very pleased in this regard. 01/03/19 on evaluation today patient appears to be doing better for the most part in regard to her wounds in particular. There does not appear to be any evidence of infection at this time which is good news. Fortunately there is no sign of really worsening anywhere except for the right great toe which she does have what appears to be a bruise/deep tissue injury which is very superficial and already resolving. I'm not sure where this came from I questioned her extensively and she does not recall what may have happened with this. Other than that the patient seems to be doing well even the left lateral ankle ulcer looks good and is getting smaller. 01/10/19 on evaluation today patient appears to be doing well in regard to her left heel wound and both of her toe wounds. Overall I feel like there is definitely improvement here and I'm happy in that regard. With that being said unfortunately she is having issues with the left lateral malleolus ulcer which unfortunately still has a lot of depth to it. This is gonna be a very difficult wound for [place] to be able to truly get to heal. I may want to consider some type of skin substitute to see if this would be of benefit for her. I'll discuss this with her more the next visit most likely. This was something I thought about more at the end of the visit when I was Artie out of the room and the patient had been discharged. 01/17/19 on evaluation today patient appears to be doing very well in regard to her wounds in general. She's been making excellent progress at this time.  Fortunately there's no sign of infection at this time either. No fevers, chills, nausea, or vomiting noted at this time. The biggest issue is still her left lateral malleolus where it appears to be doing well and is getting smaller but still shows a small corner where this is deeper and goes down into what appears to be the joint space. Nonetheless this is taking much longer to heal although it still looks better in smaller than previous evaluations. 01/24/19 on evaluation today patient's wounds actually appear to be doing rather well in general overall. She did require some sharp debridement in regard to the right great toe but everything else appears to be doing excellent no debridement was even necessary. No fevers, chills, nausea, or vomiting noted at this time. 01/31/19 on evaluation today patient actually appears to be doing much better in regard to her left foot wound on the heel as well as the ankle. The right great toe appears to be a little bit worse today this had callous over and trapped a lot of fluid underneath. Fortunately there's no signs of infection at any site which is great news. 02/07/19 on evaluation today patient actually appears to be doing decently well in regard to all of her ulcers at this point. No sharp debridement was required she is a little bit of hyper granulation in regard to the left lateral ankle as well as the left heel but the hill itself is almost completely healed which is excellent news. Overall been very pleased in this regard. 02/14/19 on evaluation today patient actually appears to be doing very well in regard to her ulcers on the right first toe, left lateral malleolus, and left heel. In fact the heel is almost completely healed at  this point. The patient does not show any signs of infection which is good news. Overall very pleased with how things have progressed. 04/18/19 Telehealth Evaluation During the COVID-19 National Emergency: Verbal Consent: Obtained  from patient Allergies: reviewed and the active list is current. Medication changes: patient has no current medication changes. COVID-19 Screening: 1. Have you traveled internationally or on a cruise ship in the last 14 dayso No 2. Have you had contact with someone with or under investigation for COVID-19o No 3. Have you had a fever, cough, sore throat, or experiencing shortness of breatho No on evaluation today actually did have a visit with this patient through a telehealth encounter with her home health nurse. Subsequently it was noted that the patient actually appears to be doing okay in regard to her wounds both the right great toe as well as the left lateral malleolus have shown signs of improvement although this in your theme around the left lateral malleolus there eschar coverings for both locations. The question is whether or not they are actually close and whether or not home health needs to discharge the patient or not. Nonetheless my concern is this point obviously is that without actually seeing her and being able to evaluate this directly I cannot ensure that she is completely healed which is the question that I'm being asked. 04/22/19 on evaluation today patient presents for her first evaluation since last time I saw her which was actually February 14, 2019. I did do a telehealth visit last week in which point it was questionable whether or not she may be healed and had to bring her in today for confirmation. With that being said she does seem to be doing quite well at this point which is good news. There does not appear to be any drainage in the deed I believe her wounds may be healed. Readmission: 09/04/2019 on evaluation today patient appears to be doing unfortunately somewhat more poorly in regard to her left foot ulcer secondary to a wound that began on 08/21/2019 at least when she first noticed this. Fortunately she has not had any evidence of active infection at this  time. Systemically. I also do not necessarily see any evidence of infection at the blister/wound site on the first metatarsal head plantar aspect. This almost appears to be something that may have just rubbed inappropriately causing this to breakdown. They did not want a wait too long to come in to be seen as again she had significant issues in the past with wounds that took quite a while to heal in fact it was close to 2 years. Nonetheless this does not appear to be quite that bad but again we do need to remove some of the necrotic tissue from the surface of the wound to tell exactly the extent. She does not appear to have any significant arterial disease at this point and again her last ABIs and TBI's are recorded above in the alert section her left ABI was 1.27 with a TBI of 0.72 to the right ABI 1.08 with a TBI of 0.39. Other than this the patient has been doing quite well since I last saw her and that was in May 2020. 09/11/2019 on evaluation today patient appeared to be doing very well with regard to her plantar foot ulcer on the left. In fact this appears to be almost completely healed which is awesome. That is after just 1 week of intervention. With that being said there is no signs of active infection  at this time. TAHANI, POTIER ([id number]) 09/18/2019 on evaluation today patient actually appears to be doing excellent in fact she is completely healed based on what I am seeing at this point. Fortunately there is no signs of active infection at this time and overall patient is very pleased to hear that this area has healed so quickly. Readmission: 05/13/2021 upon evaluation today this patient presents for reevaluation here in the clinic. This is a wound that actually we previously took care of. She had 1 on the right ankle and the left the left turned out to be be harder due to to heal but nonetheless is doing great at this point as the right that has reopened and it was noted first just  several weeks ago with a scab over it and came off in just the past few days. Fortunately there does not appear to be any obvious evidence of significant active infection at this time which is great news. No fevers, chills, nausea, vomiting, or diarrhea. 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 There does not appear to be any signs of this interfering in any way with her wound. She does have swelling we previously had compression socks for her ordered but again it does not look like she wears these on a regular basis by any means. 05/26/2021 upon evaluation today patient appears to be doing well with regard to her wound which is actually showing signs of excellent improvement. There does not appear to be any signs of active infection which is great news and overall very pleased with where things stand today. No fevers, chills, nausea, vomiting, or diarrhea. 06/02/2021 upon evaluation today patient's wound actually showing signs of excellent improvement. Fortunately there does not appear to be any signs of active infection which is great news. I think the patient is making good progress with regard to her wounds in general. 06/09/2021 upon evaluation today patient appears to be doing excellent in regard to her wounds currently. Fortunately there does not appear to be any signs of active infection which is great news. No fevers, chills, nausea, vomiting, or diarrhea. Overall extremely pleased with where things stand today. I think the patient is making excellent progress. 06/16/2021 upon evaluation today patient appears to be doing well in regard to her wound. This is going require little bit of debridement today and that was discussed with the patient. Otherwise she seems to be doing quite well and I am actually very pleased with where things stand at this point. No fevers, chills, nausea, vomiting, or diarrhea. 06/23/2021 upon evaluation today patient  appears to be doing well with regard to her wounds. She has been tolerating the dressing changes without complication. Fortunately there does not appear to be any evidence of infection and she has not had air in her home which she actually lives at an assisted living that got fixed this morning. With that being said because of that her wrap has been extremely hot and bothersome for her over the past week. 06/30/2021 upon evaluation today patient is actually making excellent progress in regard to her ankle ulcer. She has been tolerating the dressing changes without complication and overall extremely pleased with where things stand there does not appear to be any evidence of active infection which is great news. No fevers, chills, nausea, vomiting, or diarrhea. 07/07/21 upon evaluation today patients and culture on the right actually appears to be doing quite well. There does not appear  to be any signs of infection and overall very pleased with where things stand today. No fevers, chills, nausea, or vomiting noted at this time. 07/14/2021 unfortunately the patient today has some evidence of deep tissue injury and pressure getting to the ankle region. Again I am not exactly sure what is going on here but this is very similar to issues that we have had in the past. I explained to the patient that she needs to be very mindful of exactly what is happening I think sleeping in bed is probably the main issue here although there could be other culprits I am not sure what else would potentially lead to this kind of a problem for her. 07/21/2021 upon evaluation today patient's wound actually showing signs of improvement compared to last week. Fortunately there does not appear to be any signs of active infection which is great news and overall very pleased with where things stand in that regard. With that being said I do believe that she is continuing to show signs of overall of getting better although I think this is  still basically about what we were 2 weeks ago due to the worsening and now improvement. 07/28/2021 upon evaluation today patient appears to be doing well with regard to her wound. She does have some slough buildup on the surface of the wound which I would have to manage today. Fortunately there is no sign of active infection at this time. No fevers, chills, nausea, vomiting, or diarrhea. 08/04/2021 upon evaluation today patient appears to be doing about the same in regard to her wound. To be perfectly honest I am beginning to be a little bit concerned about the overall appearance of the wound bed. I do think possibly taking a sample right around the margin of the wound could be beneficial for her as far as identifying anything such as an inflammatory process or to be honest even a skin tag cancer type process that may be of concern here. Fortunately there does not appear to be any evidence of active infection at this time which is great news she is not having any pain also great news. 08/11/2021 upon evaluation today patient appears to be doing well with regard to her wound. The good news is I did review her biopsy results and it showed some inflammatory mixed findings but nothing that appeared to be malignant which is great news. Overall this is more of a chronic venous stasis type issue which again is more what we have been treating. Nonetheless I just wanted to make sure before going forward that there was not anything more untoward going on at this point. 08/18/2021 upon evaluation today patient appears to be doing well with regard to her ankle ulcer. Fortunately there does not appear to be any signs of active infection at this time which is great overall wound is dramatically improved compared to last week. Since last week I have actually placed her on doxycycline and subsequently this is a good option as far as the findings are concerned at this point. I do believe that the positive result of MRSA  is definitely something that needed to be addressed and the good news is The doxycycline is doing a good job of doing this. the doxycycline is doing that. There does not appear to be any evidence of active infection systemically which is great news. 08/25/2021 upon evaluation today patient appears to be doing well with regard to her wound. I feel like we are finally get back on  track as far as healing is concerned I am much happier with the overall appearance today. I do think that she is tolerating the dressing changes without complication which is great news. We have been using Hydrofera Blue which I think is a good option. The good news is she is also doing great in regard to her compression sock on the left which is a zipper compression that seems to be doing a great job keeping her edema under good control. 09/01/2021 upon evaluation today patient appears to be doing well with regard to her wound. Infection seems to be under much better control which is great news and very pleased in that regard. Fortunately there does not appear to be any signs of infection currently. [name], [name] ([id number]) 09/08/2021 upon evaluation today patient actually appears to be making good progress in regard to her wound. She has been tolerating the dressing changes without complication. Fortunately there does not appear to be any evidence of active infection at this time which is great news as well. No fevers, chills, nausea, vomiting, or diarrhea. 09/22/2021 upon evaluation today patient appears to be doing well with regard to her wound although is very slow to heal. We have not looked into Apligraf yet I think that is something that we should see about doing. 09/29/2021 upon evaluation today patient's wound is actually showing signs of doing about the same. I am not seeing any evidence of worsening but also no significant evidence of improvement. We did gain approval for the organogenesis products all except  for Apligraf as covered by her insurance. With that being said I do think that we can go ahead and proceed with the NuShield if the patient and her family in agreement of the plan I discussed that with him today she does have a 20% coinsurance which we also discussed. 10/06/2021 upon evaluation today patient appears to unfortunately be doing a little bit worse she appears to be infected based on what I am seeing. Fortunately there does not appear to be any signs of active infection at this time which is great news. Unfortunately it does appear to be some evidence of around the wound edge indicated by way of erythema and warmth as well as redness 10/13/2021 upon evaluation today patient actually appears to be doing excellent in regard to her ankle ulcer compared to what it was. 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 I think were fine to go ahead and apply the NuShield today. 10/20/2021 upon evaluation today patient appears to be doing excellent in fact the NuShield seems to have done all some for her thus far. I am actually very pleased with where we stand and overall I think that she is making great progress. There is no evidence of active infection at this time. 11/23; patient presents for follow-up. She has no issues or complaints today. She denies signs of infection. She reports stability in her wound healing. 11/03/2021 upon evaluation today patient appears to be doing well with regard to her wounds. She has been tolerating the dressing changes without complication. Fortunately there is no signs of active infection at this time. 11/10/2021 upon evaluation today patient appears to be doing well with regard to her right ankle which is actually showing signs of improvement with the NuShield I am  very  pleased. Subsequently in regards to the left ankle this appears to be doing okay with no evidence of issue here either. With that being said she has a significant contusion on the left leg from having fallen 2 days ago. She tells me that she was using her walker going to the closet and then when she got to the closet turned around to get something out at which point she fell according to the story. Nonetheless she does have a hematoma just below her knee on the anterior portion of her shin. My hope is that this will not open until wound although we do need I think Some compression over it also think that we need to have her use an ice as well to help with the swelling and prevent this from getting worse. The last thing she needs is a wound opened up here. 11/17/2021 upon evaluation patient's right ankle actually showing signs of improvement based on what I see currently there is a lot of new skin growth coming in which is great news. Nonetheless I do feel like that the patient is showing improvement as well in regard to her left leg with the use of the Tubigrip it swollen but not as bruised as what I would have expected after what I was seeing last week. Nonetheless I do think doubling up on the Tubigrip would probably be beneficial to try to keep some of the edema under better control here. 11/24/2021 upon evaluation today patient appears to be doing excellent in regard to her wound. This is actually measuring significantly smaller which is great news. Fortunately I do not see any signs of active infection locally nor systemically at this point. Overall I am very pleased with how the NuShield is doing. 12/30; wound bed looks healthy however not much change in overall wound volume. We applied Nushield again today in the standard fashion 12/08/2021 upon evaluation today patient's wound actually showing signs of good improvement and I am actually very pleased with where we stand today as well. I do not  see any evidence of active infection locally nor systemically which is great news. Unfortunately she has been having some issues with stomach upset and diarrhea today. 12/15/2021 upon evaluation today patient appears to be doing excellent in regard to her wound. There is a little bit of dry skin raised up around the edges of the wound but fortunately nothing too significant at this point. Fortunately I do not see any evidence of active infection either which is great news. No fevers, chills, nausea, vomiting, or diarrhea. 12/29/2021 upon evaluation today patient appears to be doing well with regard to her wound. In general I feel like she is getting very close to complete resolution. I think that she is making good progress here as well. Overall I do not see any signs of infection and very little of this appears to actually be open. 01/12/2022 upon evaluation today patient appears to be doing a little bit worse in regard to her wound. It appears that the collagen actually trapped fluid underneath the collagen which got hard and subsequently caused the fluid to back up. This patient has made the wound appear to be larger than what it was previous. With that being said I do not see any signs of obvious infection is not warm to touch and not significantly erythematous either which is good news. Nonetheless I do think that we will need to continue to keep an eye on this. Probably not go put  on antibiotic this week but I will be too far from doing so she is actually on a Z-Pak right now I do not want to really double up on antibiotics. 01/26/2022 upon evaluation today patient's wound is showing signs of improvement although this is slow to turn back around. Fortunately there does not appear to be any evidence of active infection locally or systemically which is great news and overall I am extremely pleased with where we stand today. No fevers, chills, nausea, vomiting, or diarrhea. 02/02/2022 upon evaluation  today patient appears to be doing well with regard to her wound. She has been tolerating the dressing changes without complication. Fortunately there does not appear to be any signs of active infection locally nor systemically at this time which is great news. No fevers, chills, nausea, vomiting, or diarrhea. 3/9; patient presents for follow-up. She has no issues or complaints today. She denies signs of infection. 02/16/2022 upon evaluation today patient appears to be doing well with regard to her wound. She has been tolerating the dressing changes without complication. Fortunately I do not see any evidence of active infection locally nor systemically at this point which is great news. Overall I RHYLEI, MCQUAIG ([id number]) think that the patient is making excellent progress in general. The wound is measuring smaller and though there does appear to be some need for sharp debridement today this is minimal compared to what we have noted previous. 02/23/2022 upon evaluation today patient appears to be doing well with regard to her wound she is definitely showing signs of improvement which is great news. 03/02/2022 upon evaluation today patient appears to be doing about the same in regard to her wound. Unfortunately were not seeing significant improvement. With that being said is also not significantly worse which is great news. No fevers, chills, nausea, vomiting, or diarrhea. 03-09-2022 upon evaluation today patient appears to be doing well with regard to her wound. She has been tolerating the dressing changes without complication. Fortunately there does not appear to be any signs of active infection locally or systemically which is great news. 03-16-2022 upon evaluation today patient appears to be doing well with regard to her wound this is measuring smaller and looking much better. I am actually very pleased with where we stand and I think that the patient is making great progress. I do not see any  evidence of active infection locally or systemically which is great news. No fevers, chills, nausea, vomiting, or diarrhea. 03-23-2022 upon evaluation today patient appears to be doing better in regard to her wounds. In fact the wound area actually is showing signs of excellent improvement and actually very pleased with where we stand today. I do not see any evidence of active infection locally nor systemically which is great news. 4/27; comes in today with again thick callus around the wound circumference which I removed with a curette. Some debris on the surface. Overall I do not think quite as good as it was last week by description. Using endoform 04-06-2022 upon evaluation today patient appears to be doing pretty well in regard to her wound. Fortunately I do not see any evidence of active infection locally or systemically which is great news and overall I am pleased in that regard. I do feel like this is measuring smaller postdebridement compared to where we were previous. Overall she is headed in the right direction. She does have an area that is threatening to open and looks a little irritated on the left ankle we  did measure this just for the discolored area for now its not draining too much but we want to monitor and make sure nothing worsens here. 04-13-2022 upon evaluation today patient appears to be doing better in regard to her wound although this is still very slow to heal. We have reapplied for a skin substitute but have not heard anything back as of yet. Fortunately I do not see any evidence of active infection locally or systemically at this time which is great news. 04-20-2022 upon evaluation today patient's wound is actually showing signs of significant improvement which is great news. We did get approval for skin substitutes which I think is an option here for [place] but at the same time I am not even certain that this is going to be necessary especially considering how well things seem  to be doing at this point. If she continues healing as we see her right now but I think were probably going to be able to avoid any need for additional skin substitute. Patient and her daughter are both in agreement with that plan. With that being said she did have a fall she has several areas of contusion fortunately nothing that is can require intervention at this point but nonetheless she did also fracture her rib which is extremely uncomfortable obviously. 04-27-2022 upon evaluation today patient appears to be doing well currently in regard to her wound on the right lateral ankle. Overall I feel like she is actually doing significantly better even compared to last week and very pleased in this regard. I think we are on the right track here. 05-04-2022 upon evaluation today patient appears to be doing well with regard to her wound. She is going require some sharp debridement today to clear away some of the necrotic debris. Fortunately I was able to do this quite easily without significant issue or breakdown here. There is a very small area of opening centrally but really this is pretty close to getting close. 05-11-2022 upon evaluation today patient's wound is actually showing signs of excellent improvement. I do not see any evidence of infection locally or systemically at this time. Overall I think she is very close to complete resolution with just a little bit of debridement to clear away some of the dry crusty area around the edges of the wound currently. 05-18-2022 upon evaluation patient's wound bed actually appears to be likely healed although I cannot be 100% sure I am extremely pleased however with where things stand today. I do not see any evidence of infection locally or systemically which is great news and overall I think you are on the right track here. 05-25-2022 upon evaluation today patient appears to be doing well with regard to her leg ulcer. Fortunately there does not appear to be any  signs of active infection locally or systemically at this time which is great news. No fevers, chills, nausea, vomiting, or diarrhea. With that being said she does appear to be healed in regard to her right lateral ankle which is great news. In regard to her leg unfortunately during the removal of the compression wrap there was a small skin tear that occurred with the scissors. Subsequently the patient does have a minimal amount of bleeding this is going require a couple Steri-Strips in order to seal this up. I do think it should likely heal quite well. Obviously this was unintentional. Nonetheless we do have to definitely monitor make sure that this completely heals up as well I am hopeful that  we will be the case by next week to be honest. 06-01-2022 upon evaluation today patient appears to be doing excellent in regard to her wounds. Both areas are completely healed which is great news. Fortunately I do not see any signs of active infection locally or systemically at this time which is great and overall I am extremely pleased in that regard. No fevers, chills, nausea, vomiting, or diarrhea. 7/25; patient returns to clinic today with a story that 2 to 3 days ago she was sitting in her chair and she noticed bleeding from her left lower leg lateral aspect. She denies any trauma. She used pressure and a Band-Aid to get it to stop and she arrives in clinic today in follow-up. She was discharged on 6/29 apparently to stockings although she did not have any on and I do not think she was actually wearing them. She has significant chronic venous insufficiency 07-04-2022 upon evaluation today patient presents for follow-up. In the clinic she actually was seen last week when I was on vacation due to an area on her leg which reopened unfortunately. With that being said she does seem to be doing a little bit better but unfortunately this is still giving her some trouble here. All of her other wounds are still  closed which I was previously seen her for and that is great news. 07-13-2022 upon evaluation today patient appears to be doing well with regard to her wound everything appears to be healed unfortunately she has a completely new area on her left heel which is a blister. Fortunately I do not see any signs of infection but unfortunately I do think this is Carsey, [name] [name] ([id number]) going to be ongoing issue at this time. Question has been made whether or not we can get this to reabsorb or not. Again right now I am going to treat this more as a deep tissue injury currently. Electronic Signature(s) Signed: 07/13/2022 2:18:01 PM By: Worthy Keeler PA-C Entered By: Worthy Keeler on 07/13/2022 14:18:01 Renfro, [name] [name] ([id number]) -------------------------------------------------------------------------------- Physical Exam Details Patient Name: [name] [name] Date of Service: 07/13/2022 1:15 PM Medical Record Number: [id number] Patient Account Number: [id number] Date of Birth/Sex: 07-04-1923 (88 y.o. F) Treating RN: Carlene Coria Primary Care Provider: Adrian Prows Other Clinician: Referring Provider: Adrian Prows Treating Provider/Extender: Skipper Cliche in Treatment: 13 Constitutional Well-nourished and well-hydrated in no acute distress. Respiratory normal breathing without difficulty. Psychiatric this patient is able to make decisions and demonstrates good insight into disease process. Alert and Oriented x 3. pleasant and cooperative. Notes Unfortunately based on what I am seeing currently I do think that there is a blister on the left heel that we will need to monitor. I would probably wrap her to try to get this to reabsorb initially. Depending on how things go if this does not reabsorb we may have to remove it and start from there. Electronic Signature(s) Signed: 07/13/2022 2:18:32 PM By: Worthy Keeler PA-C Entered By: Worthy Keeler on 07/13/2022  14:18:32 Putz, [name] [name] ([id number]) -------------------------------------------------------------------------------- Physician Orders Details Patient Name: [name] [name] Date of Service: 07/13/2022 1:15 PM Medical Record Number: [id number] Patient Account Number: [id number] Date of Birth/Sex: [birth year] (88 y.o. F) Treating RN: Carlene Coria Primary Care Provider: Adrian Prows Other Clinician: Referring Provider: Adrian Prows Treating Provider/Extender: Skipper Cliche in Treatment: 33 Verbal / Phone Orders: No Diagnosis Coding ICD-10 Coding Code Description [phone] Non-pressure chronic ulcer of other part of left lower leg with other  specified severity S80.12XD Contusion of left lower leg, subsequent encounter L89.626 Pressure-induced deep tissue damage of left heel I89.0 Lymphedema, not elsewhere classified I87.2 Venous insufficiency (chronic) (peripheral) Z95.0 Presence of cardiac pacemaker Z79.01 Long term (current) use of anticoagulants Follow-up Appointments o Return Appointment in 1 week. Bathing/ Shower/ Hygiene o May shower with wound dressing protected with water repellent cover or cast protector. o No tub bath. Anesthetic (Use 'Patient Medications' Section for Anesthetic Order Entry) o Lidocaine applied to wound bed Edema Control - Lymphedema / Segmental Compressive Device / Other o Elevate, Exercise Daily and Avoid Standing for Long Periods of Time. o Elevate legs to the level of the heart and pump ankles as often as possible o Elevate leg(s) parallel to the floor when sitting. Non-Wound Condition Left Lower Extremity o Cleanse affected area with antibacterial soap and water, o Apply appropriate compression. - 3 layer o Additional non-wound orders/instructions: - apply skin prep moistened gauze to blister, cover with heel cup shaped ABD pad, and wrap with 3 layers compression Electronic Signature(s) Signed: 07/13/2022 2:46:41  PM By: Carlene Coria RN Signed: 07/14/2022 5:04:29 PM By: Worthy Keeler PA-C Entered By: Carlene Coria on 07/13/2022 14:46:41 Espericueta, [name] [name] ([id number]) -------------------------------------------------------------------------------- Problem List Details Patient Name: [name] [name] Date of Service: 07/13/2022 1:15 PM Medical Record Number: [id number] Patient Account Number: [id number] Date of Birth/Sex: Jan 29, 1923 (88 y.o. F) Treating RN: Carlene Coria Primary Care Provider: Adrian Prows Other Clinician: Referring Provider: Adrian Prows Treating Provider/Extender: Skipper Cliche in Treatment: 58 Active Problems ICD-10 Encounter Code Description Active Date MDM Diagnosis L97.828 Non-pressure chronic ulcer of other part of left lower leg with other 06/27/2022 No Yes specified severity S80.12XD Contusion of left lower leg, subsequent encounter 06/27/2022 No Yes L89.626 Pressure-induced deep tissue damage of left heel 07/13/2022 No Yes I89.0 Lymphedema, not elsewhere classified 05/13/2021 No Yes I87.2 Venous insufficiency (chronic) (peripheral) 05/13/2021 No Yes Z95.0 Presence of cardiac pacemaker 05/13/2021 No Yes Z79.01 Long term (current) use of anticoagulants 05/13/2021 No Yes Inactive Problems ICD-10 Code Description Active Date Inactive Date 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, stage 3 05/13/2021 05/13/2021 S81.811A Laceration without foreign body, right lower leg, initial encounter 05/25/2022 05/25/2022 S80.12XA Contusion of left lower leg, initial encounter 11/10/2021 11/10/2021 Resolved Problems Electronic Signature(s) Signed: 07/13/2022 2:17:16 PM By: Worthy Keeler PA-C Previous Signature: 07/13/2022 1:47:41 PM Version By: Worthy Keeler PA-C Entered By: Worthy Keeler on 07/13/2022 14:17:16 Cremeans, [name] [name] ([id number]) Fawcett, [name] [name] ([id number]) -------------------------------------------------------------------------------- Progress Note  Details Patient Name: [name] [name] Date of Service: 07/13/2022 1:15 PM Medical Record Number: [id number] Patient Account Number: [id number] Date of Birth/Sex: [birth year] (88 y.o. 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 Dr. Amalia Hailey the podiatrist saw her on 07/20/2017 and sent her to the wound center for possible hyperbaric oxygen therapy. past medical history of peripheral vascular disease, varicose veins, status post appendectomy, basal cell carcinoma excision from the left leg, cholecystectomy, pacemaker placement, right lower extremity angiography done by Dr. dew in March 2017 with placement of a stent. there is also note of a successful ablation of the right  small saphenous vein done which was reviewed by ultrasound on 10/24/2016. the patient had a right small saphenous vein ablation done on 10/20/2016. The patient has never been a smoker. She has been seen by Dr. Corene Cornea dew the vascular surgeon who most recently saw her on 06/15/2017 for evaluation of ongoing problems with right leg swelling. She had a lower extremity arterial duplex examination done(02/13/17) which showed patent distal right superficial femoral artery stent and above-the-knee popliteal stent without evidence of restenosis. The ABI was more than 1.3 on the right and more than 1.3 on the left. This was consistent with noncompressible arteries due to medial calcification. The right great toe pressure and PPG waveforms are within normal  limits and the left great toe pressure and PPG waveforms are decreased. he recommended she continue to wear her compression stockings and continue with elevation. She is scheduled to have a noninvasive arterial study in the near future 08/16/2017 -- had a lower extremity arterial duplex examination done which showed patent distal right superficial femoral artery stent and above-the-knee popliteal stent without evidence of restenosis. The ABI was more than 1.3 on the right and more than 1.3 on the left. This was consistent with noncompressible arteries due to medial calcification. The right great toe pressure and PPG waveforms are within normal limits and the left great toe pressure and PPG waveforms are decreased. the x-ray of the right ankle has not yet been done 08/24/2017 -- had a right ankle x-ray -- IMPRESSION:1. No fracture, bone lesion or evidence of osteomyelitis. 2. Lateral soft tissue swelling with a soft tissue ulcer. she has not yet seen the vascular surgeon for review 08/31/17 on evaluation today patient's wound appears to be showing signs of improvement. She still with her appointment with vascular in order to review her results of her vascular study and then determine if any intervention would be recommended at that time. No fevers, chills, nausea, or vomiting noted at this time. She has been tolerating the dressing changes without complication. 09/28/17 on evaluation today patient's wound appears to show signs of good improvement in regard to the granulation tissue which is surfacing. There is still a layer of slough covering the wound and the posterior portion is still significantly deeper than the anterior nonetheless there has been some good sign of things moving towards the better. She is going to go back to Dr. dew for reevaluation to ensure her blood flow is still appropriate. That will be before her next evaluation with [place] next week. No fevers, chills, nausea, or vomiting noted  at this time. Patient does have some discomfort rated to be a 3-4/10 depending on activity specifically cleansing the wound makes it worse. 10/05/2017 -- the patient was seen by Dr. Lucky Cowboy last week and noninvasive studies showed a normal right ABI with brisk triphasic waveforms consistent with no arterial insufficiency including normal digital pressures. The duplex showed a patent distal right SFA stent and the proximal SFA was also normal. He was pleased with her test and thought she should have enough of perfusion for normal wound healing. He would see her back in 6 months time. 12/21/17 on evaluation today patient appears to be doing fairly well in regard to her right lateral ankle wound. Unfortunately the main issue that she is expansion at this point is that she is having some issues with what appears to be some cellulitis in the right anterior shin. She has also been noting a little bit of uncomfortable feeling especially last night and  her ankle area. I'm afraid that she made the developing a little bit of an infection. With that being said I think it is in the early stages. 12/28/17 on evaluation today patient's ankle appears to be doing excellent. She's making good progress at this point the cellulitis seems to have improved after last week's evaluation. Overall she is having no significant discomfort which is excellent news. She does have an appointment with Dr. dew on March 29, 2018 for reevaluation in regard to the stent he placed. She seems to have excellent blood flow in the right lower extremity. 01/19/12 on evaluation today patient's wound appears to be doing very well. In fact she does not appear to require debridement at this point, there's no evidence of infection, and overall from the standpoint of the wound she seems to be doing very well. With that being said I believe that it may be time to switch to different dressing away from the [facility] Dressing she tells me she does have  a lot going on her friend actually passed away yesterday and she's also having a lot of issues with her husband this obviously is weighing heavy on her as far as your thoughts and concerns today. 01/25/18 on evaluation today patient appears to be doing fairly well in regard to her right lateral malleolus. She has been tolerating the dressing changes without complication. Overall I feel like this is definitely showing signs of improvement as far as how the overall appearance of the wound is there's also evidence of epithelium start to migrate over the granulation tissue. In general I think that she is progressing nicely as far as the wound is concerned. The only concern she really has is whether or not we can switch to every other week visits in order to avoid having as many MONETTA, LICK ([id number]) appointments as her daughters have a difficult time getting her to her appointments as well as the patient's husband to his he is not doing very well at this point. 02/22/18 on evaluation today patient's right lateral malleolus ulcer appears to be doing great. She has been tolerating the dressing changes without complication. Overall you making excellent progress at this time. Patient is having no significant discomfort. 03/15/18 on evaluation today patient appears to be doing much more poorly in regard to her right lateral ankle ulcer at this point. Unfortunately since have last seen her her husband has passed just a few days ago is obviously weighed heavily on her her daughter also had surgery well she is with her today as usual. There does not appear to be any evidence of infection she does seem to have significant contusion/deep tissue injury to the right lateral malleolus which was not noted previous when I saw her last. It's hard to tell of exactly when this injury occurred although during the time she was spending the night in the hospital this may have been most likely. 03/22/18 on evaluation  today patient appears to actually be doing very well in regard to her ulcer. She did unfortunately have a setback which was noted last week however the good news is we seem to be getting back on track and in fact the wound in the core did still have some necrotic tissue which will be addressed at this point today but in general I'm seeing signs that things are on the up and up. She is glad to hear this obviously she's been somewhat concerned that due to the how her wound digressed more recently.  03/29/18 on evaluation today patient appears to be doing fairly well in regard to her right lower extremity lateral malleolus ulcer. She unfortunately does have a new area of pressure injury over the inferior portion where the wound has opened up a little bit larger secondary to the pressure she seems to be getting. She does tell me sometimes when she sleeps at night that it actually hurts and does seem to be pushing on the area little bit more unfortunately. There does not appear to be any evidence of infection which is good news. She has been tolerating the dressing changes without complication. She also did have some bruising in the left second and third toes due to the fact that she may have bump this or injured it although she has neuropathy so she does not feel she did move recently that may have been where this came from. Nonetheless there does not appear to be any evidence of infection at this time. 04/12/18 on evaluation today patient's wound on the right lateral ankle actually appears to be doing a little bit better with a lot of necrotic docking tissue centrally loosening up in clearing away. However she does have the beginnings of a deep tissue injury on the left lateral malleolus likely due to the fact we've been trying offload the right as much as we have. I think she may benefit from an assistive soft device to help with offloading and it looks like they're looking at one of the doughnut conditions  that wraps around the lower leg to offload which I think will definitely do a good job. With that being said I think we definitely need to address this issue on the left before it becomes a wound. Patient is not having significant pain. 04/19/18 on evaluation today patient appears to be doing excellent in regard to the progress she's made with her right lateral ankle ulcer. The left ankle region which did show evidence of a deep tissue injury seems to be resolving there's little fluid noted underneath and a blister there's nothing open at this point in time overall I feel like this is progressing nicely which is good news. She does not seem to be having significant discomfort at this point which is also good news. 04/25/18-She is here in follow up evaluation for bilateral lateral malleolar ulcers. The right lateral malleolus ulcer with pale subcutaneous tissue exposure, central area of ulcer with tendon/periosteum exposed. The left lateral malleolus ulcer now with central area of nonviable tissue, otherwise deep tissue injury. She is wearing compression wraps to the left lower extremity, she will place the right lower extremity compression wraps on when she gets home. She will be out of town over the weekend and return next week and follow-up appointment. She completed her doxycycline this morning 05/03/18 on evaluation today patient appears to be doing very well in regard to her right lateral ankle ulcer in general. At least she's showing some signs of improvement in this regard. Unfortunately she has some additional injury to the left lateral malleolus region which appears to be new likely even over the past several days. Again this determination is based on the overall appearance. With that being said the patient is obviously frustrated about this currently. 05/10/18-She is here in follow-up evaluation for bilateral lateral malleolar ulcers. She states she has purchased offloading shoes/boots and they  will arrive tomorrow. She was asked to bring them in the office at next week's appointment so her provider is aware of product being utilized.  She continues to sleep on right or left side, she has been encouraged to sleep on her back. The right lateral malleolus ulcer is precariously close to peri-osteum; will order xray. The left lateral malleolus ulcer is improved. Will switch back to santyl; she will follow up next week. 05/17/18 on evaluation today patient actually appears to be doing very well in regard to her malleolus her ulcers compared to last time I saw them. She does not seem to have as much in the way of contusion at this point which is great news. With that being said she does continue to have discomfort and I do believe that she is still continuing to benefit from the offloading/pressure reducing boots that were recommended. I think this is the key to trying to get this to heal up completely. 05/24/18 on evaluation today patient actually appears to be doing worse at this point in time unfortunately compared to her last week's evaluation. She is having really no increased pain which is good news unfortunately she does have more maceration in your theme and noted surrounding the right lateral ankle the left lateral ankle is not really is erythematous I do not see signs of the overt cellulitis on that side. Unfortunately the wounds do not seem to have shown any signs of improvement since the last evaluation. She also has significant swelling especially on the right compared to previous some of this may be due to infection however also think that she may be served better while she has these wounds by compression wrapping versus continuing to use the Juxta-Lite for the time being. Especially with the amount of drainage that she is experiencing at this point. No fevers, chills, nausea, or vomiting noted at this time. 05/31/18 on evaluation today patient appears to actually be doing better in  regard to her right lateral lower extremity ulcer specifically on the malleolus region. She has been tolerating the antibiotic without complication. With that being said she still continues to have issues but a little bit of redness although nothing like she what she was experiencing previous. She still continues to pressure to her ankle area she did get the problem on offloading boots unfortunately she will not wear them she states there too uncomfortable and she can't get in and out of the bed. Nonetheless at this point her wounds seem to be continually getting worse which is not what we want I'm getting somewhat concerned about her progress and how things are going to proceed if we do not intervene in some way shape or form. I therefore had a very lengthy conversation today about offloading yet again and even made a specific suggestion for switching her to a memory foam mattress and even gave the information for a specific one that they could look at getting if it was something that they were interested in considering. She does not want to be considered for a hospital bed air mattress although honestly insurance would not cover it that she does not have any wounds on her trunk. 06/14/18 on evaluation today both wounds over the bilateral lateral malleolus her ulcers appear to be doing better there's no evidence of pressure injury at this point. She did get the foam mattress for her bed and this does seem to have been extremely beneficial for her in my pinion. Her daughter states that she is having difficulty getting out of bed because of how soft it is. The patient also relates this to be. Nonetheless I do feel like she's actually  doing better. Unfortunately right after and around the time she was getting the mattress she also sustained a fall when she got up to go pick up the phone and ended up injuring her right elbow she has 18 sutures in place. We are not caring for this currently although  home health is going to be taking the sutures out shortly. Nonetheless this may be something that we need to evaluate going forward. It depends on [name], [name] ([id number]) how well it has or has not healed in the end. She also recently saw an orthopedic specialist for an injection in the right shoulder just before her fall unfortunately the fall seems to have worsened her pain. 06/21/18 on evaluation today patient appears to be doing about the same in regard to her lateral malleolus ulcers. Both appear to be just a little bit deeper but again we are clinging away the necrotic and dead tissue which I think is why this is progressing towards a deeper realm as opposed to improving from my measurement standpoint in that regard. Nonetheless she has been tolerating the dressing changes she absolutely hates the memory foam mattress topper that was obtained for her nonetheless I do believe this is still doing excellent as far as taking care of excess pressure in regard to the lateral malleolus regions. She in fact has no pressure injury that I see whereas in weeks past it was week by week I was constantly seeing new pressure injuries. Overall I think it has been very beneficial for her. 07/03/18; patient arrives in my clinic today. She has deep punched out areas over her bilateral lateral malleoli. The area on the right has some more depth. We spent a lot of time today talking about pressure relief for these areas. This started when her daughter asked for a prescription for a memory foam mattress. I have never written a prescription for a mattress and I don't think insurances would pay for that on an ordinary bed. In any case he came up that she has foam boots that she refuses to wear. I would suggest going to these before any other offloading issues when she is in bed. They say she is meticulous about offloading this the rest of the day 07/10/18- She is seen in follow-up evaluation for bilateral,  lateral malleolus ulcers. There is no improvement in the ulcers. She has purchased and is sleeping on a memory foam mattress/overlay, she has been using the offloading boots nightly over the past week. She has a follow up appointment with vascular medicine at the end of October, in my opinion this follow up should be expedited given her deterioration and suboptimal TBI results. We will order plain film xray of the left ankle as deeper structures are palpable; would consider having MRI, regardless of xray report(s). The ulcers will be treated with iodoflex/iodosorb, she is unable to safely change the dressings daily with santyl. 07/19/18 on evaluation today patient appears to be doing in general visually well in regard to her bilateral lateral malleolus ulcers. She has been tolerating the dressing changes without complication which is good news. With that being said we did have an x-ray performed on 07/12/18 which revealed a slight loosen see in the lateral portion of the distal left fibula which may represent artifact but underline lytic destruction or osteomyelitis could not be excluded. MRI was recommended. With that being said we can see about getting the patient scheduled for an MRI to further evaluate this area. In fact we have  that scheduled currently for August 20 19,019. 07/26/18 on evaluation today patient's wound on the right lateral ankle actually appears to be doing fairly well at this point in my pinion. She has made some good progress currently. With that being said unfortunately in regard to the left lateral ankle ulcer this seems to be a little bit more problematic at this time. In fact as I further evaluated the situation she actually had bone exposed which is the first time that's been the case in the bone appear to be necrotic. Currently I did review patient's note from Dr. Bunnie Domino office with [facility]. He stated that ABI was 1.26 on the right and 0.95 on the  left with good waveforms. Her perfusion is stable not reduced from previous studies and her digital waveforms were pretty good particularly on the right. His conclusion upon review of the note was that there was not much she could do to improve her perfusion and he felt she was adequate for wound healing. His suggestion was that she continued to see [place] and consider a synthetic skin graft if there was no underlying infection. He plans to see her back in six months or as needed. 08/01/18 on evaluation today patient appears to be doing better in regard to her right lateral ankle ulcer. Her left lateral ankle ulcer is about the same she still has bone involvement in evidence of necrosis. There does not appear to be evidence of infection at this time On the right lateral lower extremity. I have started her on the Augmentin she picked this up and started this yesterday. This is to get her through until she sees infectious disease which is scheduled for 08/12/18. 08/06/18 on evaluation today patient appears to be doing rather well considering my discussion with patient's daughter at the end of last week. The area which was marked where she had erythema seems to be improved and this is good news. With that being said overall the patient seems to be making good improvement when it comes to the overall appearance of the right lateral ankle ulcer although this has been slow she at least is coming around in this regard. Unfortunately in regard to the left lateral ankle ulcer this is osteomyelitis based on the pathology report as well is bone culture. Nonetheless we are still waiting CT scan. Unfortunately the MRI we originally ordered cannot be performed as the patient is a pacemaker which I had overlooked. Nonetheless we are working on the CT scan approval and scheduling as of now. She did go to the hospital over the weekend and was placed on IV Cefzo for a couple of days. Fortunately this seems to have improved the  erythema quite significantly which is good news. There does not appear to be any evidence of worsening infection at this time. She did have some bleeding after the last debridement therefore I did not perform any sharp debridement in regard to left lateral ankle at this point. Patient has been approved for a snap vac for the right lateral ankle. 08/14/18; the patient with wounds over her bilateral lateral malleoli. The area on the right actually looks quite good. Been using a snap back on this area. Healthy granulation and appears to be filling in. Unfortunately the area on the left is really problematic. 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 Also noted to have cellulitis. She saw Dr. Novella Olive of infectious  disease today and was put on linezolid. We are able to verify this with her pharmacy. She is completed the Augmentin that she was already on. We've been using Iodoflex to this area 08/23/18 on evaluation today patient's wounds both actually appear to be doing better compared to my prior evaluations. Fortunately she showing signs of good improvement in regard to the overall wound status especially where were using the snap vac on the right. In regard to left lateral malleolus the wound bed actually appears to be much cleaner than previously noted. I do not feel any phone directly probed during evaluation today and though there is tendon noted this does not appear to be necrotic it's actually fairly good as far as the overall appearance of the tendon is concerned. In general the wound bed actually appears to be doing significantly better than it was previous. Patient is currently in the care of [name] and I did review that note today. He actually has her on two weeks of linezolid and then following the patient will be on 1-2 months of Keflex. That is the plan currently. She has been on antibiotics therapy as  prescribed by myself initially starting on July 30, 2018 and has been on that continuously up to this point. 08/30/18 on evaluation today patient actually appears to be doing much better in regard to her right lateral malleolus ulcer. She has been tolerating the dressing changes specifically the snap vac without complication although she did have some issues with the seal currently. Apparently there was some trouble with getting it to maintain over the past week past Sunday. Nonetheless overall the wound appears better in regard to the right lateral malleolus region. In regard to left lateral malleolus this actually show some signs of additional granulation although there still tendon noted in the base of the wound this appears to be healthy not necrotic in any way whatsoever. We are considering potentially using a snap vac for the left lateral malleolus as well the product wrap from [employer], [place], was present in the clinic today we're going to see this patient I did have her come in with me after obtaining consent from the patient and her daughter in order to look at the wound and see if there's any recommendation one way or another as to whether or not they felt the snapback could be beneficial for the left lateral malleolus region. But [name], [name] ([id number]) the conclusion was that it might be but that this is definitely a little bit deeper wound than what traditionally would be utilized for a snap vac. 09/06/18 on evaluation today patient actually appears to be doing excellent in my pinion in regard to both ankle ulcers. She has been tolerating the dressing changes without complication which is great news. Specifically we have been using the snap vac. In regard to the right ankle I'm not even sure that this is going to be necessary for today and following as the wound has filled in quite nicely. In regard to the left ankle I do believe that we're seeing excellent epithelialization from the  edge as well as granulation in the central portion the tendon is still exposed but there's no evidence of necrotic bone and in general I feel like the patient has made excellent progress even compared to last week with just one week of the snap vac. 09/11/18; this is a patient who has wounds on her bilateral lateral malleoli. Initially both of these were deep stage IV wounds in the  setting of chronic arterial insufficiency. She has been revascularized. As I understand think she been using snap vacs to both of these wounds however the area on the right became more superficial and currently she is only using it on the left. Using silver collagen on the right and silver collagen under the back on the left I believe 09/19/18 on evaluation today patient actually appears to be doing very well in regard to her lateral malleolus or ulcers bilaterally. She has been tolerating the dressing changes without complication. Fortunately there does not appear to be any evidence of infection at this time. Overall I feel like she is improving in an excellent manner and I'm very pleased with the fact that everything seems to be turning towards the better for her. This has obviously been a long road. 09/27/18 on evaluation today patient actually appears to be doing very well in regard to her bilateral lateral malleolus ulcers. She has been tolerating the dressing changes without complication. Fortunately there does not appear to be any evidence of infection at this time which is also great news. No fevers, chills, nausea, or vomiting noted at this time. Overall I feel like she is doing excellent with the snap vac on the left malleolus. She had 40 mL of fluid collection over the past week. 10/04/18 on evaluation today patient actually appears to be doing well in regard to her bilateral lateral malleolus ulcers. She continues to tolerate the dressing changes without complication. One issue that I see is the snap vac on the  left lateral malleolus which appears to have sealed off some fluid underlying this area and has not really allowed it to heal to the degree that I would like to see. For that reason I did suggest at this point we may want to pack a small piece of packing strip into this region to allow it to more effectively wick out fluid. 10/11/18 in general the patient today does not feel that she has been doing very well. She's been a little bit lethargic and subsequently is having bodyaches as well according to what she tells me today. With that being said overall she has been concerned with the fact that something may be worsening although to be honest her wounds really have not been appearing poorly. She does have a new ulcer on her left heel unfortunately. This may be pressure related. Nonetheless it seems to me to have potentially started at least as a blister I do not see any evidence of deep tissue injury. In regard to the left ankle the snap vac still seems to be causing the ceiling off of the deeper part of the wound which is in turn trapping fluid. I'm not extremely pleased with the overall appearance as far as progress from last week to this week therefore I'm gonna discontinue the snap vac at this point. 10/18/18 patient unfortunately this point has not been feeling well for the past several days. She was seen by Grayland Ormond her primary care provider who is a Librarian, academic at [facility]. Subsequently she states that she's been very weak and generally feeling malaise. No fevers, chills, nausea, or vomiting noted at this time. With that being said bloodwork was performed at the PCP office on the 11th of this month which showed a white blood cell count of 10.7. This was repeated today and shows a white blood cell count of 12.4. This does show signs of worsening. Coupled with the fact that she is feeling worse and  that her left ankle wound is not really showing signs of improvement I feel like  this is an indication that the osteomyelitis is likely exacerbating not improving. Overall I think we may also want to check her C-reactive protein and sedimentation rate. Actually did call [facility] office this afternoon while the patient was in the office here with me. Subsequently based on the findings we discussed treatment possibilities and I think that it is appropriate for [place] to go ahead and initiate treatment with doxycycline which I'm going to do. Subsequently he did agree to see about adding a CRP and sedimentation rate to her orders. If that has not already been drawn to where they can run it they will contact the patient she can come back to have that check. They are in agreement with plan as far as the patient and her daughter are concerned. Nonetheless also think we need to get in touch with Dr. Henreitta Leber office to see about getting the patient scheduled with him as soon as possible. 11/08/18 on evaluation today patient presents for follow-up concerning her bilateral foot and ankle ulcers. I did do an extensive review of her chart in epic today. Subsequently she was seen by [name] he did initiate Cefepime IV antibiotic therapy. Subsequently she had some issues with her PICC line this had to be removed because it was coiled and then replaced. Fortunately that was now settled. Unfortunately she has continued have issues with her left heel as well as the issues that she is experiencing with her bilateral lateral malleolus regions. I do believe however both areas seem to be doing a little bit better on evaluation today which is good news. No fevers, chills, nausea, or vomiting noted at this time. She actually has an angiogram schedule with Dr. dew on this coming Monday, November 11, 2018. Subsequently the patient states that she is feeling much better especially than what she was roughly 2 weeks ago. She actually had to cancel an appointment because she was feeling so poorly.  No fevers, chills, nausea, or vomiting noted at this time. 11/15/18 on evaluation today patient actually is status post having had her angiogram with Dr. dew Monday, four days ago. 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 He had to go and work on several areas of the vasculature fortunately he was able to obtain no more than a 30% residual stenosis throughout post procedure. I reviewed this note today. I think this will definitely help with healing at this time. Fortunately there does not appear to be any signs of infection and I do feel like ratio already has a better appearance to it. 11/22/18 upon evaluation today patient actually appears to be doing very well in regard to her wounds in general. The right lateral malleolus looks excellent the heel looks better in the left lateral malleolus also appears to be doing a little better. With that being said the right second toe actually appears to be open and training we been watching this is been dry and stable but now is open. 12/03/2018 Seen today for follow-up and management of multiple bilateral lower extremity wounds. New pressure injury of the great toe which is closed at this time. Wound of the right distal second toe appears larger today with deep undermining and a pocket of fluid present within the undermining region. Left and right malleolus is wounds are stable today with no signs and symptoms of infection.Denies any needs or concerns during  exam today. 12/13/18 on evaluation today patient appears to be doing somewhat better in regard to her left heel ulcer. She also seems to be completely healed in regard to the right lateral malleolus ulcer. The left malleolus ulcer is smaller what unfortunately the wounds which are new over the first and second toes of the right foot are what are most concerning at this point especially the second. Both areas did require sharp debridement today. 12/20/18 on evaluation today  patient's wound actually appears to be doing better in regard to left lateral ankle and her right lateral ankle continues to remain healed. The hill ulcer on the left is improved. She does have improvement noted as well in regard to both toe ulcers. Overall I'm very pleased in this regard. No fevers, chills, nausea, or vomiting noted at this time. [name], [name] ([id number]) 12/23/18 on evaluation today patient is seen after she had her toenails trimmed at the podiatrist office due to issues with her right great toe. There was what appeared to be dark eschar on the surface of the wound which had her in the podiatrist concerned. Nonetheless as I remember that during the last office visit I had utilize silver nitrate of this area I was much less concerned about the situation. Subsequently I was able to clean off much of this tissue without any complication today. This does not appear to show any signs of infection and actually look somewhat better compared to last time post debridement. Her second toe on the right foot actually had callous over and there did appear still be some fluid underneath this that would require debridement today. 12/27/18 on evaluation today patient actually appears to be showing signs of improvement at all locations. Even the left lateral ankle although this is not quite as great as the other sites. Fortunately there does not appear to be any signs of infection at this time and both of her toes on the right foot seem to be showing signs of improvement which is good news and very pleased in this regard. 01/03/19 on evaluation today patient appears to be doing better for the most part in regard to her wounds in particular. There does not appear to be any evidence of infection at this time which is good news. Fortunately there is no sign of really worsening anywhere except for the right great toe which she does have what appears to be a bruise/deep tissue injury which is very  superficial and already resolving. I'm not sure where this came from I questioned her extensively and she does not recall what may have happened with this. Other than that the patient seems to be doing well even the left lateral ankle ulcer looks good and is getting smaller. 01/10/19 on evaluation today patient appears to be doing well in regard to her left heel wound and both of her toe wounds. Overall I feel like there is definitely improvement here and I'm happy in that regard. With that being said unfortunately she is having issues with the left lateral malleolus ulcer which unfortunately still has a lot of depth to it. This is gonna be a very difficult wound for [place] to be able to truly get to heal. I may want to consider some type of skin substitute to see if this would be of benefit for her. I'll discuss this with her more the next visit most likely. This was something I thought about more at the end of the visit when I was Artie out of  the room and the patient had been discharged. 01/17/19 on evaluation today patient appears to be doing very well in regard to her wounds in general. She's been making excellent progress at this time. Fortunately there's no sign of infection at this time either. No fevers, chills, nausea, or vomiting noted at this time. The biggest issue is still her left lateral malleolus where it appears to be doing well and is getting smaller but still shows a small corner where this is deeper and goes down into what appears to be the joint space. Nonetheless this is taking much longer to heal although it still looks better in smaller than previous evaluations. 01/24/19 on evaluation today patient's wounds actually appear to be doing rather well in general overall. She did require some sharp debridement in regard to the right great toe but everything else appears to be doing excellent no debridement was even necessary. No fevers, chills, nausea, or vomiting noted at this  time. 01/31/19 on evaluation today patient actually appears to be doing much better in regard to her left foot wound on the heel as well as the ankle. The right great toe appears to be a little bit worse today this had callous over and trapped a lot of fluid underneath. Fortunately there's no signs of infection at any site which is great news. 02/07/19 on evaluation today patient actually appears to be doing decently well in regard to all of her ulcers at this point. No sharp debridement was required she is a little bit of hyper granulation in regard to the left lateral ankle as well as the left heel but the hill itself is almost completely healed which is excellent news. Overall been very pleased in this regard. 02/14/19 on evaluation today patient actually appears to be doing very well in regard to her ulcers on the right first toe, left lateral malleolus, and left heel. In fact the heel is almost completely healed at this point. The patient does not show any signs of infection which is good news. Overall very pleased with how things have progressed. 04/18/19 Telehealth Evaluation During the COVID-19 National Emergency: Verbal Consent: Obtained from patient Allergies: reviewed and the active list is current. Medication changes: patient has no current medication changes. COVID-19 Screening: 1. Have you traveled internationally or on a cruise ship in the last 14 dayso No 2. Have you had contact with someone with or under investigation for COVID-19o No 3. Have you had a fever, cough, sore throat, or experiencing shortness of breatho No on evaluation today actually did have a visit with this patient through a telehealth encounter with her home health nurse. Subsequently it was noted that the patient actually appears to be doing okay in regard to her wounds both the right great toe as well as the left lateral malleolus have shown signs of improvement although this in your theme around the left lateral  malleolus there eschar coverings for both locations. The question is whether or not they are actually close and whether or not home health needs to discharge the patient or not. Nonetheless my concern is this point obviously is that without actually seeing her and being able to evaluate this directly I cannot ensure that she is completely healed which is the question that I'm being asked. 04/22/19 on evaluation today patient presents for her first evaluation since last time I saw her which was actually February 14, 2019. I did do a telehealth visit last week in which point it was questionable whether  or not she may be healed and had to bring her in today for confirmation. With that being said she does seem to be doing quite well at this point which is good news. There does not appear to be any drainage in the deed I believe her wounds may be healed. Readmission: 09/04/2019 on evaluation today patient appears to be doing unfortunately somewhat more poorly in regard to her left foot ulcer secondary to a wound that began on 08/21/2019 at least when she first noticed this. Fortunately she has not had any evidence of active infection at this time. Systemically. I also do not necessarily see any evidence of infection at the blister/wound site on the first metatarsal head plantar aspect. This almost appears to be something that may have just rubbed inappropriately causing this to breakdown. They did not want a wait too long to come in to be seen as again she had significant issues in the past with wounds that took quite a while to heal in fact it was close to 2 years. Nonetheless this does not appear to be quite that bad but again we do need to remove some of the necrotic tissue from the surface of the wound to tell exactly the extent. She does not appear to have any significant arterial disease at this point and again her last ABIs and TBI's are recorded above in the alert section her left ABI was 1.27 with a  TBI of 0.72 to the right ABI 1.08 with a TBI of 0.39. Other than this the patient has been doing quite [name], [name] ([id number]) well since I last saw her and that was in May 2020. 09/11/2019 on evaluation today patient appeared to be doing very well with regard to her plantar foot ulcer on the left. In fact this appears to be almost completely healed which is awesome. That is after just 1 week of intervention. With that being said there is no signs of active infection at this time. 09/18/2019 on evaluation today patient actually appears to be doing excellent in fact she is completely healed based on what I am seeing at this point. Fortunately there is no signs of active infection at this time and overall patient is very pleased to hear that this area has healed so quickly. Readmission: 05/13/2021 upon evaluation today this patient presents for reevaluation here in the clinic. This is a wound that actually we previously took care of. She had 1 on the right ankle and the left the left turned out to be be harder due to to heal but nonetheless is doing great at this point as the right that has reopened and it was noted first just several weeks ago with a scab over it and came off in just the past few days. Fortunately there does not appear to be any obvious evidence of significant active infection at this time which is great news. No fevers, chills, nausea, vomiting, or diarrhea. 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 There does not appear to be any signs of this interfering in any way with her wound. She does have swelling we previously had compression socks for her ordered but again it does not look like she wears these on a regular basis by any means. 05/26/2021 upon evaluation today patient appears to be doing well with regard to her wound which is actually showing signs of excellent improvement. There does not appear to be any signs of active  infection which is great news and overall very pleased with where things stand today. No fevers, chills, nausea, vomiting, or diarrhea. 06/02/2021 upon evaluation today patient's wound actually showing signs of excellent improvement. Fortunately there does not appear to be any signs of active infection which is great news. I think the patient is making good progress with regard to her wounds in general. 06/09/2021 upon evaluation today patient appears to be doing excellent in regard to her wounds currently. Fortunately there does not appear to be any signs of active infection which is great news. No fevers, chills, nausea, vomiting, or diarrhea. Overall extremely pleased with where things stand today. I think the patient is making excellent progress. 06/16/2021 upon evaluation today patient appears to be doing well in regard to her wound. This is going require little bit of debridement today and that was discussed with the patient. Otherwise she seems to be doing quite well and I am actually very pleased with where things stand at this point. No fevers, chills, nausea, vomiting, or diarrhea. 06/23/2021 upon evaluation today patient appears to be doing well with regard to her wounds. She has been tolerating the dressing changes without complication. Fortunately there does not appear to be any evidence of infection and she has not had air in her home which she actually lives at an assisted living that got fixed this morning. With that being said because of that her wrap has been extremely hot and bothersome for her over the past week. 06/30/2021 upon evaluation today patient is actually making excellent progress in regard to her ankle ulcer. She has been tolerating the dressing changes without complication and overall extremely pleased with where things stand there does not appear to be any evidence of active infection which is great news. No fevers, chills, nausea, vomiting, or diarrhea. 07/07/21 upon  evaluation today patients and culture on the right actually appears to be doing quite well. There does not appear to be any signs of infection and overall very pleased with where things stand today. No fevers, chills, nausea, or vomiting noted at this time. 07/14/2021 unfortunately the patient today has some evidence of deep tissue injury and pressure getting to the ankle region. Again I am not exactly sure what is going on here but this is very similar to issues that we have had in the past. I explained to the patient that she needs to be very mindful of exactly what is happening I think sleeping in bed is probably the main issue here although there could be other culprits I am not sure what else would potentially lead to this kind of a problem for her. 07/21/2021 upon evaluation today patient's wound actually showing signs of improvement compared to last week. Fortunately there does not appear to be any signs of active infection which is great news and overall very pleased with where things stand in that regard. With that being said I do believe that she is continuing to show signs of overall of getting better although I think this is still basically about what we were 2 weeks ago due to the worsening and now improvement. 07/28/2021 upon evaluation today patient appears to be doing well with regard to her wound. She does have some slough buildup on the surface of the wound which I would have to manage today. Fortunately there is no sign of active infection at this time. No fevers, chills, nausea, vomiting, or diarrhea. 08/04/2021 upon evaluation today patient appears to be doing about the same in  regard to her wound. To be perfectly honest I am beginning to be a little bit concerned about the overall appearance of the wound bed. I do think possibly taking a sample right around the margin of the wound could be beneficial for her as far as identifying anything such as an inflammatory process or to be  honest even a skin tag cancer type process that may be of concern here. Fortunately there does not appear to be any evidence of active infection at this time which is great news she is not having any pain also great news. 08/11/2021 upon evaluation today patient appears to be doing well with regard to her wound. The good news is I did review her biopsy results and it showed some inflammatory mixed findings but nothing that appeared to be malignant which is great news. Overall this is more of a chronic venous stasis type issue which again is more what we have been treating. Nonetheless I just wanted to make sure before going forward that there was not anything more untoward going on at this point. 08/18/2021 upon evaluation today patient appears to be doing well with regard to her ankle ulcer. Fortunately there does not appear to be any signs of active infection at this time which is great overall wound is dramatically improved compared to last week. Since last week I have actually placed her on doxycycline and subsequently this is a good option as far as the findings are concerned at this point. 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 the doxycycline is doing that. There does not appear to be any evidence of active infection systemically which is great news. 08/25/2021 upon evaluation today patient appears to be doing well with regard to her wound. I feel like we are finally get back on track as far as healing is concerned I am much happier with the overall appearance today. I do think that she is tolerating the dressing changes without complication which is great news. We have been using Hydrofera Blue which I think is a good option. The good news is she is also doing great in [place], [name] [name]. ([id number]) regard to her compression sock on the left which is a zipper  compression that seems to be doing a great job keeping her edema under good control. 09/01/2021 upon evaluation today patient appears to be doing well with regard to her wound. Infection seems to be under much better control which is great news and very pleased in that regard. Fortunately there does not appear to be any signs of infection currently. 09/08/2021 upon evaluation today patient actually appears to be making good progress in regard to her wound. She has been tolerating the dressing changes without complication. Fortunately there does not appear to be any evidence of active infection at this time which is great news as well. No fevers, chills, nausea, vomiting, or diarrhea. 09/22/2021 upon evaluation today patient appears to be doing well with regard to her wound although is very slow to heal. We have not looked into Apligraf yet I think that is something that we should see about doing. 09/29/2021 upon evaluation today patient's wound is actually showing signs of doing about the same. I am not seeing any evidence of worsening but also no significant evidence of improvement. We did gain approval for the organogenesis products all except for Apligraf as  covered by her insurance. With that being said I do think that we can go ahead and proceed with the NuShield if the patient and her family in agreement of the plan I discussed that with him today she does have a 20% coinsurance which we also discussed. 10/06/2021 upon evaluation today patient appears to unfortunately be doing a little bit worse she appears to be infected based on what I am seeing. Fortunately there does not appear to be any signs of active infection at this time which is great news. Unfortunately it does appear to be some evidence of around the wound edge indicated by way of erythema and warmth as well as redness 10/13/2021 upon evaluation today patient actually appears to be doing excellent in regard to her ankle ulcer compared  to what it was. 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 I think were fine to go ahead and apply the NuShield today. 10/20/2021 upon evaluation today patient appears to be doing excellent in fact the NuShield seems to have done all some for her thus far. I am actually very pleased with where we stand and overall I think that she is making great progress. There is no evidence of active infection at this time. 11/23; patient presents for follow-up. She has no issues or complaints today. She denies signs of infection. She reports stability in her wound healing. 11/03/2021 upon evaluation today patient appears to be doing well with regard to her wounds. She has been tolerating the dressing changes without complication. Fortunately there is no signs of active infection at this time. 11/10/2021 upon evaluation today patient appears to be doing well with regard to her right ankle which is actually showing signs of improvement with the NuShield I am very pleased. Subsequently in regards to the left ankle this appears to be doing okay with no evidence of issue here either. With that being said she has a significant contusion on the left leg from having fallen 2 days ago. She tells me that she was using her walker going to the closet and then when she got to the closet turned around to get something out at which point she fell according to the story. Nonetheless she does have a hematoma just below her knee on the anterior portion of her shin. My hope is that this will not open until wound although we do need I think Some compression over it also think that we need to have her use an ice as well to help with the swelling and prevent this from getting worse. The last thing she needs is a wound opened up  here. 11/17/2021 upon evaluation patient's right ankle actually showing signs of improvement based on what I see currently there is a lot of new skin growth coming in which is great news. Nonetheless I do feel like that the patient is showing improvement as well in regard to her left leg with the use of the Tubigrip it swollen but not as bruised as what I would have expected after what I was seeing last week. Nonetheless I do think doubling up on the Tubigrip would probably be beneficial to try to keep some of the edema under better control here. 11/24/2021 upon evaluation today patient appears to be doing excellent in regard to her wound. This is  actually measuring significantly smaller which is great news. Fortunately I do not see any signs of active infection locally nor systemically at this point. Overall I am very pleased with how the NuShield is doing. 12/30; wound bed looks healthy however not much change in overall wound volume. We applied Nushield again today in the standard fashion 12/08/2021 upon evaluation today patient's wound actually showing signs of good improvement and I am actually very pleased with where we stand today as well. I do not see any evidence of active infection locally nor systemically which is great news. Unfortunately she has been having some issues with stomach upset and diarrhea today. 12/15/2021 upon evaluation today patient appears to be doing excellent in regard to her wound. There is a little bit of dry skin raised up around the edges of the wound but fortunately nothing too significant at this point. Fortunately I do not see any evidence of active infection either which is great news. No fevers, chills, nausea, vomiting, or diarrhea. 12/29/2021 upon evaluation today patient appears to be doing well with regard to her wound. In general I feel like she is getting very close to complete resolution. I think that she is making good progress here as well. Overall I do  not see any signs of infection and very little of this appears to actually be open. 01/12/2022 upon evaluation today patient appears to be doing a little bit worse in regard to her wound. It appears that the collagen actually trapped fluid underneath the collagen which got hard and subsequently caused the fluid to back up. This patient has made the wound appear to be larger than what it was previous. With that being said I do not see any signs of obvious infection is not warm to touch and not significantly erythematous either which is good news. Nonetheless I do think that we will need to continue to keep an eye on this. Probably not go put on antibiotic this week but I will be too far from doing so she is actually on a Z-Pak right now I do not want to really double up on antibiotics. 01/26/2022 upon evaluation today patient's wound is showing signs of improvement although this is slow to turn back around. Fortunately there does not appear to be any evidence of active infection locally or systemically which is great news and overall I am extremely pleased with where we stand today. No fevers, chills, nausea, vomiting, or diarrhea. 02/02/2022 upon evaluation today patient appears to be doing well with regard to her wound. She has been tolerating the dressing changes without complication. Fortunately there does not appear to be any signs of active infection locally nor systemically at this time which is great news. No fevers, chills, nausea, vomiting, or diarrhea. [name], [name] ([id number]) 3/9; patient presents for follow-up. She has no issues or complaints today. She denies signs of infection. 02/16/2022 upon evaluation today patient appears to be doing well with regard to her wound. She has been tolerating the dressing changes without complication. Fortunately I do not see any evidence of active infection locally nor systemically at this point which is great news. Overall I think that the patient is  making excellent progress in general. The wound is measuring smaller and though there does appear to be some need for sharp debridement today this is minimal compared to what we have noted previous. 02/23/2022 upon evaluation today patient appears to be doing well with regard to her wound she is definitely showing signs of  improvement which is great news. 03/02/2022 upon evaluation today patient appears to be doing about the same in regard to her wound. Unfortunately were not seeing significant improvement. With that being said is also not significantly worse which is great news. No fevers, chills, nausea, vomiting, or diarrhea. 03-09-2022 upon evaluation today patient appears to be doing well with regard to her wound. She has been tolerating the dressing changes without complication. Fortunately there does not appear to be any signs of active infection locally or systemically which is great news. 03-16-2022 upon evaluation today patient appears to be doing well with regard to her wound this is measuring smaller and looking much better. I am actually very pleased with where we stand and I think that the patient is making great progress. I do not see any evidence of active infection locally or systemically which is great news. No fevers, chills, nausea, vomiting, or diarrhea. 03-23-2022 upon evaluation today patient appears to be doing better in regard to her wounds. In fact the wound area actually is showing signs of excellent improvement and actually very pleased with where we stand today. I do not see any evidence of active infection locally nor systemically which is great news. 4/27; comes in today with again thick callus around the wound circumference which I removed with a curette. Some debris on the surface. Overall I do not think quite as good as it was last week by description. Using endoform 04-06-2022 upon evaluation today patient appears to be doing pretty well in regard to her wound. Fortunately  I do not see any evidence of active infection locally or systemically which is great news and overall I am pleased in that regard. I do feel like this is measuring smaller postdebridement compared to where we were previous. Overall she is headed in the right direction. She does have an area that is threatening to open and looks a little irritated on the left ankle we did measure this just for the discolored area for now its not draining too much but we want to monitor and make sure nothing worsens here. 04-13-2022 upon evaluation today patient appears to be doing better in regard to her wound although this is still very slow to heal. We have reapplied for a skin substitute but have not heard anything back as of yet. Fortunately I do not see any evidence of active infection locally or systemically at this time which is great news. 04-20-2022 upon evaluation today patient's wound is actually showing signs of significant improvement which is great news. We did get approval for skin substitutes which I think is an option here for [place] but at the same time I am not even certain that this is going to be necessary especially considering how well things seem to be doing at this point. If she continues healing as we see her right now but I think were probably going to be able to avoid any need for additional skin substitute. Patient and her daughter are both in agreement with that plan. With that being said she did have a fall she has several areas of contusion fortunately nothing that is can require intervention at this point but nonetheless she did also fracture her rib which is extremely uncomfortable obviously. 04-27-2022 upon evaluation today patient appears to be doing well currently in regard to her wound on the right lateral ankle. Overall I feel like she is actually doing significantly better even compared to last week and very pleased in this regard. I think  we are on the right track here. 05-04-2022 upon  evaluation today patient appears to be doing well with regard to her wound. She is going require some sharp debridement today to clear away some of the necrotic debris. Fortunately I was able to do this quite easily without significant issue or breakdown here. There is a very small area of opening centrally but really this is pretty close to getting close. 05-11-2022 upon evaluation today patient's wound is actually showing signs of excellent improvement. I do not see any evidence of infection locally or systemically at this time. Overall I think she is very close to complete resolution with just a little bit of debridement to clear away some of the dry crusty area around the edges of the wound currently. 05-18-2022 upon evaluation patient's wound bed actually appears to be likely healed although I cannot be 100% sure I am extremely pleased however with where things stand today. I do not see any evidence of infection locally or systemically which is great news and overall I think you are on the right track here. 05-25-2022 upon evaluation today patient appears to be doing well with regard to her leg ulcer. Fortunately there does not appear to be any signs of active infection locally or systemically at this time which is great news. No fevers, chills, nausea, vomiting, or diarrhea. With that being said she does appear to be healed in regard to her right lateral ankle which is great news. In regard to her leg unfortunately during the removal of the compression wrap there was a small skin tear that occurred with the scissors. Subsequently the patient does have a minimal amount of bleeding this is going require a couple Steri-Strips in order to seal this up. I do think it should likely heal quite well. Obviously this was unintentional. Nonetheless we do have to definitely monitor make sure that this completely heals up as well I am hopeful that we will be the case by next week to be honest. 06-01-2022 upon  evaluation today patient appears to be doing excellent in regard to her wounds. Both areas are completely healed which is great news. Fortunately I do not see any signs of active infection locally or systemically at this time which is great and overall I am extremely pleased in that regard. No fevers, chills, nausea, vomiting, or diarrhea. 7/25; patient returns to clinic today with a story that 2 to 3 days ago she was sitting in her chair and she noticed bleeding from her left lower leg lateral aspect. She denies any trauma. She used pressure and a Band-Aid to get it to stop and she arrives in clinic today in follow-up. She was discharged on 6/29 apparently to stockings although she did not have any on and I do not think she was actually wearing them. She has significant chronic venous insufficiency 07-04-2022 upon evaluation today patient presents for follow-up. In the clinic she actually was seen last week when I was on vacation due to an area on her leg which reopened unfortunately. With that being said she does seem to be doing a little bit better but unfortunately this is still [place], [name] [name] ([id number]) giving her some trouble here. All of her other wounds are still closed which I was previously seen her for and that is great news. 07-13-2022 upon evaluation today patient appears to be doing well with regard to her wound everything appears to be healed unfortunately she has a completely new area on her left heel  which is a blister. Fortunately I do not see any signs of infection but unfortunately I do think this is going to be ongoing issue at this time. Question has been made whether or not we can get this to reabsorb or not. Again right now I am going to treat this more as a deep tissue injury currently. Objective Constitutional Well-nourished and well-hydrated in no acute distress. Vitals Time Taken: 1:29 PM, Height: 62 in, Weight: 150 lbs, BMI: 27.4, Temperature: 98.2 F, Pulse: 68  bpm, Respiratory Rate: 18 breaths/min, Blood Pressure: 135/76 mmHg. Respiratory normal breathing without difficulty. Psychiatric this patient is able to make decisions and demonstrates good insight into disease process. Alert and Oriented x 3. pleasant and cooperative. General Notes: Unfortunately based on what I am seeing currently I do think that there is a blister on the left heel that we will need to monitor. I would probably wrap her to try to get this to reabsorb initially. Depending on how things go if this does not reabsorb we may have to remove it and start from there. Integumentary (Hair, Skin) Wound #10 status is Healed - Epithelialized. Original cause of wound was Trauma. The date acquired was: 06/24/2022. The wound has been in treatment 2 weeks. The wound is located on the Left,Lateral Lower Leg. The wound measures 0cm length x 0cm width x 0cm depth; 0cm^2 area and 0cm^3 volume. There is no tunneling or undermining noted. There is a large amount of serosanguineous drainage noted. There is no granulation within the wound bed. There is no necrotic tissue within the wound bed. Assessment Active Problems ICD-10 Non-pressure chronic ulcer of other part of left lower leg with other specified severity Contusion of left lower leg, subsequent encounter Pressure-induced deep tissue damage of left heel Lymphedema, not elsewhere classified Venous insufficiency (chronic) (peripheral) Presence of cardiac pacemaker Long term (current) use of anticoagulants Plan 1. I would recommend currently that we going continue with the wound care measures as before and the patient is in agreement with plan this includes the use of the 3 layer compression wrap which is new but nonetheless will help to keep the swelling down in this heel area I am hoping that we can get this to reabsorb. 2. I would recommend as well that we have the patient continue to utilize the skin prep with foam pads over top in order  to try to keep this from rupturing I am hoping it will reabsorb but we will see how things look come next week. We will see patient back for reevaluation in 1 week here in the clinic. If anything worsens or changes patient will contact our office for additional recommendations. ZINA, PITZER ([id number]) Electronic Signature(s) Signed: 07/13/2022 2:19:26 PM By: Worthy Keeler PA-C Entered By: Worthy Keeler on 07/13/2022 14:19:26 Joffe, [name] [name] ([id number]) -------------------------------------------------------------------------------- SuperBill Details Patient Name: [name] [name] Date of Service: 07/13/2022 Medical Record Number: [id number] Patient Account Number: [id number] Date of Birth/Sex: 19-Nov-1923 (88 y.o. F) Treating RN: Carlene Coria Primary Care Provider: Adrian Prows Other Clinician: Referring Provider: Adrian Prows Treating Provider/Extender: Skipper Cliche in Treatment: 60 Diagnosis Coding ICD-10 Codes Code Description [id number] Non-pressure chronic ulcer of other part of left lower leg with other specified severity S80.12XD Contusion of left lower leg, subsequent encounter L89.626 Pressure-induced deep tissue damage of left heel I89.0 Lymphedema, not elsewhere classified I87.2 Venous insufficiency (chronic) (peripheral) Z95.0 Presence of cardiac pacemaker Z79.01 Long term (current) use of anticoagulants Facility Procedures CPT4 Code: 02409735 Description: (  Facility Use Only) 29581LT - [facility] LWR LT LEG Modifier: Quantity: 1 Physician Procedures CPT4 Code: 8110315 Description: 94585 - WC PHYS LEVEL 4 - EST PT Modifier: Quantity: 1 CPT4 Code: Description: ICD-10 Diagnosis Description L97.828 Non-pressure chronic ulcer of other part of left lower leg with other spe S80.12XD Contusion of left lower leg, subsequent encounter L89.626 Pressure-induced deep tissue damage of left heel I89.0  Lymphedema, not elsewhere  classified Modifier: cified severity Quantity: Electronic Signature(s) Signed: 07/13/2022 2:47:33 PM By: Carlene Coria RN Signed: 07/14/2022 5:04:29 PM By: Worthy Keeler PA-C Previous Signature: 07/13/2022 2:23:34 PM Version By: Worthy Keeler PA-C Entered By: Carlene Coria on 07/13/2022 14:47:32

## 2022-07-14 NOTE — Progress Notes (Signed)
[name], HORIUCHI ([id number]) Visit Report for 07/13/2022 Arrival Information Details Patient Name: [name], [name]. Date of Service: 07/13/2022 1:15 PM Medical Record Number: [id number] Patient Account Number: [id number] Date of Birth/Sex: 07-Oct-1923 (88 y.o. F) Treating RN: Carlene Coria Primary Care Teaghan Melrose: Adrian Prows Other Clinician: Referring Slyvia Lartigue: Adrian Prows Treating Gunnar Hereford/Extender: Skipper Cliche in Treatment: 22 Visit Information History Since Last Visit All ordered tests and consults were completed: No Patient Arrived: [name] Added or deleted any medications: No Arrival Time: 13:28 Any new allergies or adverse reactions: No Accompanied By: daughter Had a fall or experienced change in No Transfer Assistance: None activities of daily living that may affect Patient Identification Verified: Yes risk of falls: Secondary Verification Process Completed: Yes Signs or symptoms of abuse/neglect since last visito No Patient Requires Transmission-Based No Hospitalized since last visit: No Precautions: Implantable device outside of the clinic excluding No Patient Has Alerts: Yes cellular tissue based products placed in the center Patient Alerts: Patient on Blood since last visit: Thinner Has Dressing in Place as Prescribed: Yes NOT DIABETIC Has Compression in Place as Prescribed: Yes ***ELIQUIS*** Pain Present Now: No Electronic Signature(s) Signed: 07/14/2022 9:24:45 AM By: Carlene Coria RN Entered By: Carlene Coria on 07/13/2022 13:29:08 Haberle, [name] ([id number]) -------------------------------------------------------------------------------- Clinic Level of Care Assessment Details Patient Name: [name] Date of Service: 07/13/2022 1:15 PM Medical Record Number: [id number] Patient Account Number: [id number] Date of Birth/Sex: May 05, 1923 (88 y.o. F) Treating RN: Carlene Coria Primary Care Jaselle Pryer: Adrian Prows Other  Clinician: Referring Carra Brindley: Adrian Prows Treating Michah Minton/Extender: Skipper Cliche in Treatment: 60 Clinic Level of Care Assessment Items TOOL 1 Quantity Score '[]'$  - Use when EandM and Procedure is performed on INITIAL visit 0 ASSESSMENTS - Nursing Assessment / Reassessment '[]'$  - General Physical Exam (combine w/ comprehensive assessment (listed just below) when performed on new 0 pt. evals) '[]'$  - 0 Comprehensive Assessment (HX, ROS, Risk Assessments, Wounds Hx, etc.) ASSESSMENTS - Wound and Skin Assessment / Reassessment '[]'$  - Dermatologic / Skin Assessment (not related to wound area) 0 ASSESSMENTS - Ostomy and/or Continence Assessment and Care '[]'$  - Incontinence Assessment and Management 0 '[]'$  - 0 Ostomy Care Assessment and Management (repouching, etc.) PROCESS - Coordination of Care '[]'$  - Simple Patient / Family Education for ongoing care 0 '[]'$  - 0 Complex (extensive) Patient / Family Education for ongoing care '[]'$  - 0 Staff obtains Programmer, systems, Records, Test Results / Process Orders '[]'$  - 0 Staff telephones HHA, Nursing Homes / Clarify orders / etc '[]'$  - 0 Routine Transfer to another Facility (non-emergent condition) '[]'$  - 0 Routine Hospital Admission (non-emergent condition) '[]'$  - 0 New Admissions / Biomedical engineer / Ordering NPWT, Apligraf, etc. '[]'$  - 0 Emergency Hospital Admission (emergent condition) PROCESS - Special Needs '[]'$  - Pediatric / Minor Patient Management 0 '[]'$  - 0 Isolation Patient Management '[]'$  - 0 Hearing / Language / Visual special needs '[]'$  - 0 Assessment of Community assistance (transportation, D/C planning, etc.) '[]'$  - 0 Additional assistance / Altered mentation '[]'$  - 0 Support Surface(s) Assessment (bed, cushion, seat, etc.) INTERVENTIONS - Miscellaneous '[]'$  - External ear exam 0 '[]'$  - 0 Patient Transfer (multiple staff / Civil Service fast streamer / Similar devices) '[]'$  - 0 Simple Staple / Suture removal (25 or less) '[]'$  - 0 Complex Staple / Suture removal  (26 or more) '[]'$  - 0 Hypo/Hyperglycemic Management (do not check if billed separately) '[]'$  - 0 Ankle / Brachial Index (ABI) - do not check if billed separately Has the  patient been seen at the hospital within the last three years: Yes Total Score: 0 Level Of Care: ____ [name] ([id number]) Electronic Signature(s) Signed: 07/14/2022 9:24:45 AM By: Carlene Coria RN Entered By: Carlene Coria on 07/13/2022 14:47:19 Ingber, [name] ([id number]) -------------------------------------------------------------------------------- Compression Therapy Details Patient Name: [name] Date of Service: 07/13/2022 1:15 PM Medical Record Number: [id number] Patient Account Number: [id number] Date of Birth/Sex: 13-May-1923 (88 y.o. F) Treating RN: Carlene Coria Primary Care Indiah Heyden: Adrian Prows Other Clinician: Referring Jaelle Campanile: Adrian Prows Treating Avika Carbine/Extender: Skipper Cliche in Treatment: 60 Compression Therapy Performed for Wound Assessment: NonWound Condition Suspected Deep Tissue Injury - Left Foot Performed By: Clinician Carlene Coria, RN Compression Type: Three Layer Post Procedure Diagnosis Same as Pre-procedure Electronic Signature(s) Signed: 07/13/2022 2:47:09 PM By: Carlene Coria RN Entered By: Carlene Coria on 07/13/2022 14:47:09 Ephraim, [name] ([id number]) -------------------------------------------------------------------------------- Encounter Discharge Information Details Patient Name: [name] Date of Service: 07/13/2022 1:15 PM Medical Record Number: [id number] Patient Account Number: [id number] Date of Birth/Sex: [birth year] (88 y.o. F) Treating RN: Carlene Coria Primary Care Yaritzi Craun: Adrian Prows Other Clinician: Referring Suheyb Raucci: Adrian Prows Treating Leonilda Cozby/Extender: Skipper Cliche in Treatment: 78 Encounter Discharge Information Items Discharge Condition: Stable Ambulatory Status: Walker Discharge  Destination: Home Transportation: Private Auto Accompanied By: daughter Schedule Follow-up Appointment: Yes Clinical Summary of Care: Electronic Signature(s) Signed: 07/13/2022 2:48:33 PM By: Carlene Coria RN Entered By: Carlene Coria on 07/13/2022 14:48:33 Husted, [name] ([id number]) -------------------------------------------------------------------------------- Lower Extremity Assessment Details Patient Name: [name] Date of Service: 07/13/2022 1:15 PM Medical Record Number: [id number] Patient Account Number: [id number] Date of Birth/Sex: [birth year] (88 y.o. F) Treating RN: Carlene Coria Primary Care Mckinzie Saksa: Adrian Prows Other Clinician: Referring Minha Fulco: Adrian Prows Treating Damani Rando/Extender: Skipper Cliche in Treatment: 60 Edema Assessment Assessed: [Left: No] [Right: No] Edema: [Left: Ye] [Right: s] Calf Left: Right: Point of Measurement: 33 cm From Medial Instep 28 cm Ankle Left: Right: Point of Measurement: 12 cm From Medial Instep 17 cm Vascular Assessment Pulses: Dorsalis Pedis Palpable: [Left:Yes] Electronic Signature(s) Signed: 07/14/2022 9:24:45 AM By: Carlene Coria RN Entered By: Carlene Coria on 07/13/2022 13:51:30 [name], [name] ([id number]) -------------------------------------------------------------------------------- Multi Wound Chart Details Patient Name: [name] Date of Service: 07/13/2022 1:15 PM Medical Record Number: [id number] Patient Account Number: [id number] Date of Birth/Sex: 01-29-1923 (88 y.o. F) Treating RN: Carlene Coria Primary Care Edmundo Tedesco: Adrian Prows Other Clinician: Referring Xzavien Harada: Adrian Prows Treating Lenell Mcconnell/Extender: Skipper Cliche in Treatment: 60 Vital Signs Height(in): 62 Pulse(bpm): 71 Weight(lbs): 150 Blood Pressure(mmHg): 135/76 Body Mass Index(BMI): 27.4 Temperature(F): 98.2 Respiratory Rate(breaths/min): 18 Photos: [10:No Photos] [N/A:N/A] Wound  Location: [10:Left, Lateral Lower Leg] [N/A:N/A] Wounding Event: [10:Trauma] [N/A:N/A] Primary Etiology: [10:Trauma, Other] [N/A:N/A] Comorbid History: [10:Cataracts, Congestive Heart Failure, N/A Hypertension, Peripheral Arterial Disease, Osteoarthritis, Neuropathy] Date Acquired: [10:06/24/2022] [N/A:N/A] Weeks of Treatment: [10:2] [N/A:N/A] Wound Status: [10:Healed - Epithelialized] [N/A:N/A] Wound Recurrence: [10:No] [N/A:N/A] Measurements L x W x D (cm) [10:0x0x0] [N/A:N/A] Area (cm) : [10:0] [N/A:N/A] Volume (cm) : [10:0] [N/A:N/A] % Reduction in Area: [10:100.00%] [N/A:N/A] % Reduction in Volume: [10:100.00%] [N/A:N/A] Classification: [10:Full Thickness Without Exposed Support Structures] [N/A:N/A] Exudate Amount: [10:Large] [N/A:N/A] Exudate Type: [10:Serosanguineous] [N/A:N/A] Exudate Color: [10:red, brown] [N/A:N/A] Granulation Amount: [10:None Present (0%)] [N/A:N/A] Necrotic Amount: [10:None Present (0%)] [N/A:N/A] Exposed Structures: [10:Fascia: No Fat Layer (Subcutaneous Tissue): No Tendon: No Muscle: No Joint: No Bone: No] [N/A:N/A] Treatment Notes Electronic Signature(s) Signed: 07/14/2022 9:24:45 AM By: Carlene Coria RN Entered By: Carlene Coria on 07/13/2022 13:51:55  LYNDSIE, WALLMAN ([id number]) -------------------------------------------------------------------------------- Multi-Disciplinary Care Plan Details Patient Name: AYMEE, FOMBY. Date of Service: 07/13/2022 1:15 PM Medical Record Number: [id number] Patient Account Number: [id number] Date of Birth/Sex: 1923/05/01 (88 y.o. F) Treating RN: Carlene Coria Primary Care Saarah Dewing: Adrian Prows Other Clinician: Referring Yonis Carreon: Adrian Prows Treating Dorita Rowlands/Extender: Skipper Cliche in Treatment: 60 Active Inactive Electronic Signature(s) Signed: 07/14/2022 9:24:45 AM By: Carlene Coria RN Entered By: Carlene Coria on 07/13/2022 13:51:37 Barkow, [name]  ([id number]) -------------------------------------------------------------------------------- Pain Assessment Details Patient Name: [name] Date of Service: 07/13/2022 1:15 PM Medical Record Number: [id number] Patient Account Number: [id number] Date of Birth/Sex: 03-14-1923 (88 y.o. F) Treating RN: Carlene Coria Primary Care Annalee Meyerhoff: Adrian Prows Other Clinician: Referring Atilla Zollner: Adrian Prows Treating [place]/Extender: Skipper Cliche in Treatment: 18 Active Problems Location of Pain Severity and Description of Pain Patient Has Paino No Site Locations Pain Management and Medication Current Pain Management: Electronic Signature(s) Signed: 07/14/2022 9:24:45 AM By: Carlene Coria RN Entered By: Carlene Coria on 07/13/2022 13:29:45 Gellis, [name] ([id number]) -------------------------------------------------------------------------------- Patient/Caregiver Education Details Patient Name: [name] Date of Service: 07/13/2022 1:15 PM Medical Record Number: [id number] Patient Account Number: [id number] Date of Birth/Gender: 04/22/1923 (88 y.o. F) Treating RN: Carlene Coria Primary Care Physician: Adrian Prows Other Clinician: Referring Physician: Adrian Prows Treating Physician/Extender: Skipper Cliche in Treatment: 35 Education Assessment Education Provided To: Patient Education Topics Provided Wound/Skin Impairment: Methods: Explain/Verbal Responses: State content correctly Electronic Signature(s) Signed: 07/14/2022 9:24:45 AM By: Carlene Coria RN Entered By: Carlene Coria on 07/13/2022 14:47:43 Sharp, [name] ([id number]) -------------------------------------------------------------------------------- Wound Assessment Details Patient Name: [name] Date of Service: 07/13/2022 1:15 PM Medical Record Number: [id number] Patient Account Number: [id number] Date of Birth/Sex: 06/20/1923 (88 y.o. F) Treating RN: Carlene Coria Primary Care Skarlet Lyons: Adrian Prows Other Clinician: Referring Saima Monterroso: Adrian Prows Treating Lazarius Rivkin/Extender: Skipper Cliche in Treatment: 60 Wound Status Wound Number: 10 Primary Trauma, Other Etiology: Wound Location: Left, Lateral Lower Leg Wound Healed - Epithelialized Wounding Event: Trauma Status: Date Acquired: 06/24/2022 Comorbid Cataracts, Congestive Heart Failure, Hypertension, Weeks Of Treatment: 2 History: Peripheral Arterial Disease, Osteoarthritis, Neuropathy Clustered Wound: No Wound Measurements Length: (cm) Width: (cm) Depth: (cm) Area: (cm) Volume: (cm) 0 % Reduction in Area: 100% 0 % Reduction in Volume: 100% 0 Tunneling: No 0 Undermining: No 0 Wound Description Classification: Full Thickness Without Exposed Support Structu Exudate Amount: Large Exudate Type: Serosanguineous Exudate Color: red, brown res Wound Bed Granulation Amount: None Present (0%) Exposed Structure Necrotic Amount: None Present (0%) Fascia Exposed: No Fat Layer (Subcutaneous Tissue) Exposed: No Tendon Exposed: No Muscle Exposed: No Joint Exposed: No Bone Exposed: No Treatment Notes Wound #10 (Lower Leg) Wound Laterality: Left, Lateral Cleanser Peri-Wound Care Topical Primary Dressing Secondary Dressing Secured With Compression Wrap Compression Stockings Add-Ons Electronic Signature(s) Signed: 07/14/2022 9:24:45 AM By: Carlene Coria RN Entered By: Carlene Coria on 07/13/2022 13:50:27 Cumbo, [name] ([id number]) -------------------------------------------------------------------------------- Vitals Details Patient Name: [name] Date of Service: 07/13/2022 1:15 PM Medical Record Number: [id number] Patient Account Number: [id number] Date of Birth/Sex: 08-May-1923 (88 y.o. F) Treating RN: Carlene Coria Primary Care Tallon Gertz: Adrian Prows Other Clinician: Referring Nadeem Romanoski: Adrian Prows Treating Judeen Geralds/Extender: Skipper Cliche in Treatment: 60 Vital Signs Time Taken: 13:29 Temperature (F): 98.2 Height (in): 62 Pulse (bpm): 68 Weight (lbs): 150 Respiratory Rate (breaths/min): 18 Body Mass Index (BMI): 27.4 Blood Pressure (mmHg): 135/76 Reference Range: 80 - 120 mg / dl Electronic Signature(s) Signed: 07/14/2022 9:24:45 AM By: Dolores Lory,  Carrie RN Entered By: Carlene Coria on 07/13/2022 13:29:38

## 2022-07-20 ENCOUNTER — Other Ambulatory Visit
Admission: RE | Admit: 2022-07-20 | Discharge: 2022-07-20 | Disposition: A | Discharge status: Home / Self Care | Payer: Medicare Other | Source: Ambulatory Visit | Attending: Physician Assistant | Admitting: Physician Assistant

## 2022-07-20 ENCOUNTER — Encounter: Payer: Medicare Other | Admitting: Physician Assistant

## 2022-07-20 DIAGNOSIS — B9562 Methicillin resistant Staphylococcus aureus infection as the cause of diseases classified elsewhere: Secondary | ICD-10-CM | POA: Diagnosis not present

## 2022-07-20 DIAGNOSIS — S91302A Unspecified open wound, left foot, initial encounter: Secondary | ICD-10-CM | POA: Insufficient documentation

## 2022-07-20 DIAGNOSIS — X58XXXA Exposure to other specified factors, initial encounter: Secondary | ICD-10-CM | POA: Insufficient documentation

## 2022-07-20 DIAGNOSIS — L97828 Non-pressure chronic ulcer of other part of left lower leg with other specified severity: Secondary | ICD-10-CM | POA: Diagnosis not present

## 2022-07-21 NOTE — Progress Notes (Signed)
[name] ([id number]) Visit Report for 07/20/2022 Chief Complaint Document Details Patient Name: [name], [name]. Date of Service: 07/20/2022 9:45 AM Medical Record Number: [id number] Patient Account Number: [id number] Date of Birth/Sex: 08/05/1923 (88 y.o. F) Treating RN: [name] Primary Care Provider: Adrian [name] Other Clinician: Massie [name] Referring Provider: Adrian [name] Treating Provider/Extender: [name] in Treatment: 8 Information Obtained from: Patient Chief Complaint Left leg ulcer and left heel blister/deep tissue injury Electronic Signature(s) Signed: 07/20/2022 10:01:51 AM By: [name] Keeler PA-C Entered By: [name] on 07/20/2022 10:01:50 [name], [name] ([id number]) -------------------------------------------------------------------------------- Debridement Details Patient Name: [name] Date of Service: 07/20/2022 9:45 AM Medical Record Number: [id number] Patient Account Number: [id number] Date of Birth/Sex: Apr 25, 1923 (88 y.o. F) Treating RN: [name] Primary Care Provider: Adrian [name] Other Clinician: Massie [name] Referring Provider: Adrian [name] Treating Provider/Extender: [name] in Treatment: 61 Debridement Performed for Wound #11 Left Calcaneus Assessment: Performed By: Physician [name] Sams., PA-C Debridement Type: Debridement Level of Consciousness (Pre- Awake and Alert procedure): Pre-procedure Verification/Time Out Yes - 10:36 Taken: Start Time: 10:36 Total Area Debrided (L x W): 8 (cm) x 5 (cm) = 40 (cm) Tissue and other material Viable, Non-Viable, Slough, Subcutaneous, Slough debrided: Level: Skin/Subcutaneous Tissue Debridement Description: Excisional Instrument: Curette Bleeding: Minimum Hemostasis Achieved: Pressure End Time: 10:39 Response to Treatment: Procedure was tolerated well Level of Consciousness (Post- Awake and Alert procedure): Post  Debridement Measurements of Total Wound Length: (cm) 8 Stage: Category/Stage III Width: (cm) 5 Depth: (cm) 0.1 Volume: (cm) 3.142 Character of Wound/Ulcer Post Debridement: Stable Post Procedure Diagnosis Same as Pre-procedure Electronic Signature(s) Signed: 07/20/2022 3:11:41 PM By: [name] Signed: 07/20/2022 5:20:01 PM By: Gretta Cool, BSN, RN, CWS, Kim RN, BSN Signed: 07/20/2022 5:21:19 PM By: [name] Keeler PA-C Entered By: [name] on 07/20/2022 10:39:39 [name], [name] ([id number]) -------------------------------------------------------------------------------- HPI Details Patient Name: [name] Date of Service: 07/20/2022 9:45 AM Medical Record Number: [id number] Patient Account Number: [id number] Date of Birth/Sex: 1923-06-30 (88 y.o. 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 Dr. Amalia Hailey the podiatrist saw her on 07/20/2017 and sent her to the wound center for possible hyperbaric oxygen therapy. past medical history of peripheral vascular disease, varicose veins, status post appendectomy, basal cell carcinoma excision from the left leg, cholecystectomy, pacemaker placement, right lower extremity angiography done by Dr. dew in March 2017 with placement of a stent. there is also note of a successful ablation of the right small saphenous vein done which was reviewed by ultrasound on 10/24/2016. the patient had a right small saphenous vein ablation done on 10/20/2016. The patient has never been a smoker. She has been seen by Dr. Corene Cornea dew the vascular surgeon who most  recently saw her on 06/15/2017 for evaluation of ongoing problems with right leg swelling. She had a lower extremity arterial duplex examination done(02/13/17) which showed patent distal right superficial femoral artery stent and above-the-knee popliteal stent without evidence of restenosis. The ABI was more than 1.3 on the right and more than 1.3 on the left. This was consistent with noncompressible arteries due to medial calcification. The right great toe pressure and PPG waveforms are within  normal limits and the left great toe pressure and PPG waveforms are decreased. he recommended she continue to wear her compression stockings and continue with elevation. She is scheduled to have a noninvasive arterial study in the near future 08/16/2017 -- had a lower extremity arterial duplex examination done which showed patent distal right superficial femoral artery stent and above-the-knee popliteal stent without evidence of restenosis. The ABI was more than 1.3 on the right and more than 1.3 on the left. This was consistent with noncompressible arteries due to medial calcification. The right great toe pressure and PPG waveforms are within normal limits and the left great toe pressure and PPG waveforms are decreased. the x-ray of the right ankle has not yet been done 08/24/2017 -- had a right ankle x-ray -- IMPRESSION:1. No fracture, bone lesion or evidence of osteomyelitis. 2. Lateral soft tissue swelling with a soft tissue ulcer. she has not yet seen the vascular surgeon for review 08/31/17 on evaluation today patient's wound appears to be showing signs of improvement. She still with her appointment with vascular in order to review her results of her vascular study and then determine if any intervention would be recommended at that time. No fevers, chills, nausea, or vomiting noted at this time. She has been tolerating the dressing changes without complication. 09/28/17 on evaluation today patient's  wound appears to show signs of good improvement in regard to the granulation tissue which is surfacing. There is still a layer of slough covering the wound and the posterior portion is still significantly deeper than the anterior nonetheless there has been some good sign of things moving towards the better. She is going to go back to Dr. dew for reevaluation to ensure her blood flow is still appropriate. That will be before her next evaluation with [place] next week. No fevers, chills, nausea, or vomiting noted at this time. Patient does have some discomfort rated to be a 3-4/10 depending on activity specifically cleansing the wound makes it worse. 10/05/2017 -- the patient was seen by Dr. Lucky Cowboy last week and noninvasive studies showed a normal right ABI with brisk triphasic waveforms consistent with no arterial insufficiency including normal digital pressures. The duplex showed a patent distal right SFA stent and the proximal SFA was also normal. He was pleased with her test and thought she should have enough of perfusion for normal wound healing. He would see her back in 6 months time. 12/21/17 on evaluation today patient appears to be doing fairly well in regard to her right lateral ankle wound. Unfortunately the main issue that she is expansion at this point is that she is having some issues with what appears to be some cellulitis in the right anterior shin. She has also been noting a little bit of uncomfortable feeling especially last night and her ankle area. I'm afraid that she made the developing a little bit of an infection. With that being said I think it is in the early stages. 12/28/17 on evaluation today patient's ankle appears to be doing excellent. She's making good progress at this point the cellulitis seems to have improved after last week's evaluation. Overall she is having no significant discomfort which is excellent news. She does have an appointment with Dr. dew on March 29, 2018 for  reevaluation in regard to the stent he placed. She seems to have excellent blood flow in the right lower extremity. 01/19/12 on evaluation today patient's wound appears to be doing very well. In fact she does not appear to  require debridement at this point, there's no evidence of infection, and overall from the standpoint of the wound she seems to be doing very well. With that being said I believe that it may be time to switch to different dressing away from the [employer] Dressing she tells me she does have a lot going on her friend actually passed away yesterday and she's also having a lot of issues with her husband this obviously is weighing heavy on her as far as your thoughts and concerns today. 01/25/18 on evaluation today patient appears to be doing fairly well in regard to her right lateral malleolus. She has been tolerating the dressing changes without complication. Overall I feel like this is definitely showing signs of improvement as far as how the overall appearance of the wound is there's also evidence of epithelium start to migrate over the granulation tissue. In general I think that she is progressing nicely as far as the wound is concerned. The only concern she really has is whether or not we can switch to every other week visits in order to avoid having as many appointments as her daughters have a difficult time getting her to her appointments as well as the patient's husband to his he is not doing very well at this point. 02/22/18 on evaluation today patient's right lateral malleolus ulcer appears to be doing great. She has been tolerating the dressing changes without complication. Overall you making excellent progress at this time. Patient is having no significant discomfort. [name], [name] ([id number]) 03/15/18 on evaluation today patient appears to be doing much more poorly in regard to her right lateral ankle ulcer at this point. Unfortunately since have last seen her her  husband has passed just a few days ago is obviously weighed heavily on her her daughter also had surgery well she is with her today as usual. There does not appear to be any evidence of infection she does seem to have significant contusion/deep tissue injury to the right lateral malleolus which was not noted previous when I saw her last. It's hard to tell of exactly when this injury occurred although during the time she was spending the night in the hospital this may have been most likely. 03/22/18 on evaluation today patient appears to actually be doing very well in regard to her ulcer. She did unfortunately have a setback which was noted last week however the good news is we seem to be getting back on track and in fact the wound in the core did still have some necrotic tissue which will be addressed at this point today but in general I'm seeing signs that things are on the up and up. She is glad to hear this obviously she's been somewhat concerned that due to the how her wound digressed more recently. 03/29/18 on evaluation today patient appears to be doing fairly well in regard to her right lower extremity lateral malleolus ulcer. She unfortunately does have a new area of pressure injury over the inferior portion where the wound has opened up a little bit larger secondary to the pressure she seems to be getting. She does tell me sometimes when she sleeps at night that it actually hurts and does seem to be pushing on the area little bit more unfortunately. There does not appear to be any evidence of infection which is good news. She has been tolerating the dressing changes without complication. She also did have some bruising in the left second and third toes due to the  fact that she may have bump this or injured it although she has neuropathy so she does not feel she did move recently that may have been where this came from. Nonetheless there does not appear to be any evidence of infection at this  time. 04/12/18 on evaluation today patient's wound on the right lateral ankle actually appears to be doing a little bit better with a lot of necrotic docking tissue centrally loosening up in clearing away. However she does have the beginnings of a deep tissue injury on the left lateral malleolus likely due to the fact we've been trying offload the right as much as we have. I think she may benefit from an assistive soft device to help with offloading and it looks like they're looking at one of the doughnut conditions that wraps around the lower leg to offload which I think will definitely do a good job. With that being said I think we definitely need to address this issue on the left before it becomes a wound. Patient is not having significant pain. 04/19/18 on evaluation today patient appears to be doing excellent in regard to the progress she's made with her right lateral ankle ulcer. The left ankle region which did show evidence of a deep tissue injury seems to be resolving there's little fluid noted underneath and a blister there's nothing open at this point in time overall I feel like this is progressing nicely which is good news. She does not seem to be having significant discomfort at this point which is also good news. 04/25/18-She is here in follow up evaluation for bilateral lateral malleolar ulcers. The right lateral malleolus ulcer with pale subcutaneous tissue exposure, central area of ulcer with tendon/periosteum exposed. The left lateral malleolus ulcer now with central area of nonviable tissue, otherwise deep tissue injury. She is wearing compression wraps to the left lower extremity, she will place the right lower extremity compression wraps on when she gets home. She will be out of town over the weekend and return next week and follow-up appointment. She completed her doxycycline this morning 05/03/18 on evaluation today patient appears to be doing very well in regard to her right  lateral ankle ulcer in general. At least she's showing some signs of improvement in this regard. Unfortunately she has some additional injury to the left lateral malleolus region which appears to be new likely even over the past several days. Again this determination is based on the overall appearance. With that being said the patient is obviously frustrated about this currently. 05/10/18-She is here in follow-up evaluation for bilateral lateral malleolar ulcers. She states she has purchased offloading shoes/boots and they will arrive tomorrow. She was asked to bring them in the office at next week's appointment so her provider is aware of product being utilized. She continues to sleep on right or left side, she has been encouraged to sleep on her back. The right lateral malleolus ulcer is precariously close to peri-osteum; will order xray. The left lateral malleolus ulcer is improved. Will switch back to santyl; she will follow up next week. 05/17/18 on evaluation today patient actually appears to be doing very well in regard to her malleolus her ulcers compared to last time I saw them. She does not seem to have as much in the way of contusion at this point which is great news. With that being said she does continue to have discomfort and I do believe that she is still continuing to benefit from the offloading/pressure reducing  boots that were recommended. I think this is the key to trying to get this to heal up completely. 05/24/18 on evaluation today patient actually appears to be doing worse at this point in time unfortunately compared to her last week's evaluation. She is having really no increased pain which is good news unfortunately she does have more maceration in your theme and noted surrounding the right lateral ankle the left lateral ankle is not really is erythematous I do not see signs of the overt cellulitis on that side. Unfortunately the wounds do not seem to have shown any signs of  improvement since the last evaluation. She also has significant swelling especially on the right compared to previous some of this may be due to infection however also think that she may be served better while she has these wounds by compression wrapping versus continuing to use the Juxta-Lite for the time being. Especially with the amount of drainage that she is experiencing at this point. No fevers, chills, nausea, or vomiting noted at this time. 05/31/18 on evaluation today patient appears to actually be doing better in regard to her right lateral lower extremity ulcer specifically on the malleolus region. She has been tolerating the antibiotic without complication. With that being said she still continues to have issues but a little bit of redness although nothing like she what she was experiencing previous. She still continues to pressure to her ankle area she did get the problem on offloading boots unfortunately she will not wear them she states there too uncomfortable and she can't get in and out of the bed. Nonetheless at this point her wounds seem to be continually getting worse which is not what we want I'm getting somewhat concerned about her progress and how things are going to proceed if we do not intervene in some way shape or form. I therefore had a very lengthy conversation today about offloading yet again and even made a specific suggestion for switching her to a memory foam mattress and even gave the information for a specific one that they could look at getting if it was something that they were interested in considering. She does not want to be considered for a hospital bed air mattress although honestly insurance would not cover it that she does not have any wounds on her trunk. 06/14/18 on evaluation today both wounds over the bilateral lateral malleolus her ulcers appear to be doing better there's no evidence of pressure injury at this point. She did get the foam mattress for her  bed and this does seem to have been extremely beneficial for her in my pinion. Her daughter states that she is having difficulty getting out of bed because of how soft it is. The patient also relates this to be. Nonetheless I do feel like she's actually doing better. Unfortunately right after and around the time she was getting the mattress she also sustained a fall when she got up to go pick up the phone and ended up injuring her right elbow she has 18 sut[address] in place. We are not caring for this currently although home health is going to be taking the sutures out shortly. Nonetheless this may be something that we need to evaluate going forward. It depends on how well it has or has not healed in the end. She also recently saw an orthopedic specialist for an injection in the right shoulder just before her fall unfortunately the fall seems to have worsened her pain. 06/21/18 on evaluation today patient  appears to be doing about the same in regard to her lateral malleolus ulcers. Both appear to be just a little bit deeper but again we are clinging away the necrotic and dead tissue which I think is why this is progressing towards a deeper realm as opposed [name], [name]. ([id number]) to improving from my measurement standpoint in that regard. Nonetheless she has been tolerating the dressing changes she absolutely hates the memory foam mattress topper that was obtained for her nonetheless I do believe this is still doing excellent as far as taking care of excess pressure in regard to the lateral malleolus regions. She in fact has no pressure injury that I see whereas in weeks past it was week by week I was constantly seeing new pressure injuries. Overall I think it has been very beneficial for her. 07/03/18; patient arrives in my clinic today. She has deep punched out areas over her bilateral lateral malleoli. The area on the right has some more depth. We spent a lot of time today talking about pressure  relief for these areas. This started when her daughter asked for a prescription for a memory foam mattress. I have never written a prescription for a mattress and I don't think insurances would pay for that on an ordinary bed. In any case he came up that she has foam boots that she refuses to wear. I would suggest going to these before any other offloading issues when she is in bed. They say she is meticulous about offloading this the rest of the day 07/10/18- She is seen in follow-up evaluation for bilateral, lateral malleolus ulcers. There is no improvement in the ulcers. She has purchased and is sleeping on a memory foam mattress/overlay, she has been using the offloading boots nightly over the past week. She has a follow up appointment with vascular medicine at the end of October, in my opinion this follow up should be expedited given her deterioration and suboptimal TBI results. We will order plain film xray of the left ankle as deeper structures are palpable; would consider having MRI, regardless of xray report(s). The ulcers will be treated with iodoflex/iodosorb, she is unable to safely change the dressings daily with santyl. 07/19/18 on evaluation today patient appears to be doing in general visually well in regard to her bilateral lateral malleolus ulcers. She has been tolerating the dressing changes without complication which is good news. With that being said we did have an x-ray performed on 07/12/18 which revealed a slight loosen see in the lateral portion of the distal left fibula which may represent artifact but underline lytic destruction or osteomyelitis could not be excluded. MRI was recommended. With that being said we can see about getting the patient scheduled for an MRI to further evaluate this area. In fact we have that scheduled currently for August 20 19,019. 07/26/18 on evaluation today patient's wound on the right lateral ankle actually appears to be doing fairly well at this  point in my pinion. She has made some good progress currently. With that being said unfortunately in regard to the left lateral ankle ulcer this seems to be a little bit more problematic at this time. In fact as I further evaluated the situation she actually had bone exposed which is the first time that's been the case in the bone appear to be necrotic. Currently I did review patient's note from Dr. Bunnie Domino office with [facility]. He stated that ABI was 1.26 on the right and 0.95  on the left with good waveforms. Her perfusion is stable not reduced from previous studies and her digital waveforms were pretty good particularly on the right. His conclusion upon review of the note was that there was not much she could do to improve her perfusion and he felt she was adequate for wound healing. His suggestion was that she continued to see [place] and consider a synthetic skin graft if there was no underlying infection. He plans to see her back in six months or as needed. 08/01/18 on evaluation today patient appears to be doing better in regard to her right lateral ankle ulcer. Her left lateral ankle ulcer is about the same she still has bone involvement in evidence of necrosis. There does not appear to be evidence of infection at this time On the right lateral lower extremity. I have started her on the Augmentin she picked this up and started this yesterday. This is to get her through until she sees infectious disease which is scheduled for 08/12/18. 08/06/18 on evaluation today patient appears to be doing rather well considering my discussion with patient's daughter at the end of last week. The area which was marked where she had erythema seems to be improved and this is good news. With that being said overall the patient seems to be making good improvement when it comes to the overall appearance of the right lateral ankle ulcer although this has been slow she at least is coming around in this  regard. Unfortunately in regard to the left lateral ankle ulcer this is osteomyelitis based on the pathology report as well is bone culture. Nonetheless we are still waiting CT scan. Unfortunately the MRI we originally ordered cannot be performed as the patient is a pacemaker which I had overlooked. Nonetheless we are working on the CT scan approval and scheduling as of now. She did go to the hospital over the weekend and was placed on IV Cefzo for a couple of days. Fortunately this seems to have improved the erythema quite significantly which is good news. There does not appear to be any evidence of worsening infection at this time. She did have some bleeding after the last debridement therefore I did not perform any sharp debridement in regard to left lateral ankle at this point. Patient has been approved for a snap vac for the right lateral ankle. 08/14/18; the patient with wounds over her bilateral lateral malleoli. The area on the right actually looks quite good. Been using a snap back on this area. Healthy granulation and appears to be filling in. Unfortunately the area on the left is really problematic. 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 Also noted to have cellulitis. She saw Dr. Novella Olive of infectious disease today and was put on linezolid. We are able to verify this with her pharmacy. She is completed the Augmentin that she was already on. We've been using Iodoflex to this area 08/23/18 on evaluation today patient's wounds both actually appear to be doing better compared to my prior evaluations. Fortunately she showing signs of good improvement in regard to the overall wound status especially where were using the snap vac on the right. In regard to left lateral malleolus the wound bed actually appears to be much cleaner than previously noted. I do not feel any phone directly probed during evaluation today  and though there is tendon noted this does not appear to be necrotic it's actually  fairly good as far as the overall appearance of the tendon is concerned. In general the wound bed actually appears to be doing significantly better than it was previous. Patient is currently in the care of [name] and I did review that note today. He actually has her on two weeks of linezolid and then following the patient will be on 1-2 months of Keflex. That is the plan currently. She has been on antibiotics therapy as prescribed by myself initially starting on July 30, 2018 and has been on that continuously up to this point. 08/30/18 on evaluation today patient actually appears to be doing much better in regard to her right lateral malleolus ulcer. She has been tolerating the dressing changes specifically the snap vac without complication although she did have some issues with the seal currently. Apparently there was some trouble with getting it to maintain over the past week past Sunday. Nonetheless overall the wound appears better in regard to the right lateral malleolus region. In regard to left lateral malleolus this actually show some signs of additional granulation although there still tendon noted in the base of the wound this appears to be healthy not necrotic in any way whatsoever. We are considering potentially using a snap vac for the left lateral malleolus as well the product wrap from [employer], [place], was present in the clinic today we're going to see this patient I did have her come in with me after obtaining consent from the patient and her daughter in order to look at the wound and see if there's any recommendation one way or another as to whether or not they felt the snapback could be beneficial for the left lateral malleolus region. But the conclusion was that it might be but that this is definitely a little bit deeper wound than what traditionally would be utilized for a snap vac. 09/06/18 on  evaluation today patient actually appears to be doing excellent in my pinion in regard to both ankle ulcers. She has been tolerating the dressing changes without complication which is great news. Specifically we have been using the snap vac. In regard to the right ankle I'm not even sure that this is going to be necessary for today and following as the wound has filled in quite nicely. In regard to the left ankle I do believe [name], [name] ([id number]) that we're seeing excellent epithelialization from the edge as well as granulation in the central portion the tendon is still exposed but there's no evidence of necrotic bone and in general I feel like the patient has made excellent progress even compared to last week with just one week of the snap vac. 09/11/18; this is a patient who has wounds on her bilateral lateral malleoli. Initially both of these were deep stage IV wounds in the setting of chronic arterial insufficiency. She has been revascularized. As I understand think she been using snap vacs to both of these wounds however the area on the right became more superficial and currently she is only using it on the left. Using silver collagen on the right and silver collagen under the back on the left I believe 09/19/18 on evaluation today patient actually appears to be doing very well in regard to her lateral malleolus or ulcers bilaterally. She has been tolerating the dressing changes without complication. Fortunately there does not appear to be any evidence of infection at this time. Overall I feel like she is improving in an excellent manner and I'm very pleased with the  fact that everything seems to be turning towards the better for her. This has obviously been a long road. 09/27/18 on evaluation today patient actually appears to be doing very well in regard to her bilateral lateral malleolus ulcers. She has been tolerating the dressing changes without complication. Fortunately there does  not appear to be any evidence of infection at this time which is also great news. No fevers, chills, nausea, or vomiting noted at this time. Overall I feel like she is doing excellent with the snap vac on the left malleolus. She had 40 mL of fluid collection over the past week. 10/04/18 on evaluation today patient actually appears to be doing well in regard to her bilateral lateral malleolus ulcers. She continues to tolerate the dressing changes without complication. One issue that I see is the snap vac on the left lateral malleolus which appears to have sealed off some fluid underlying this area and has not really allowed it to heal to the degree that I would like to see. For that reason I did suggest at this point we may want to pack a small piece of packing strip into this region to allow it to more effectively wick out fluid. 10/11/18 in general the patient today does not feel that she has been doing very well. She's been a little bit lethargic and subsequently is having bodyaches as well according to what she tells me today. With that being said overall she has been concerned with the fact that something may be worsening although to be honest her wounds really have not been appearing poorly. She does have a new ulcer on her left heel unfortunately. This may be pressure related. Nonetheless it seems to me to have potentially started at least as a blister I do not see any evidence of deep tissue injury. In regard to the left ankle the snap vac still seems to be causing the ceiling off of the deeper part of the wound which is in turn trapping fluid. I'm not extremely pleased with the overall appearance as far as progress from last week to this week therefore I'm gonna discontinue the snap vac at this point. 10/18/18 patient unfortunately this point has not been feeling well for the past several days. She was seen by Grayland Ormond her primary care provider who is a Librarian, academic at [facility]. Subsequently she states that she's been very weak and generally feeling malaise. No fevers, chills, nausea, or vomiting noted at this time. With that being said bloodwork was performed at the PCP office on the 11th of this month which showed a white blood cell count of 10.7. This was repeated today and shows a white blood cell count of 12.4. This does show signs of worsening. Coupled with the fact that she is feeling worse and that her left ankle wound is not really showing signs of improvement I feel like this is an indication that the osteomyelitis is likely exacerbating not improving. Overall I think we may also want to check her C-reactive protein and sedimentation rate. Actually did call [facility] office this afternoon while the patient was in the office here with me. Subsequently based on the findings we discussed treatment possibilities and I think that it is appropriate for [place] to go ahead and initiate treatment with doxycycline which I'm going to do. Subsequently he did agree to see about adding a CRP and sedimentation rate to her orders. If that has not already been drawn to where they can run  it they will contact the patient she can come back to have that check. They are in agreement with plan as far as the patient and her daughter are concerned. Nonetheless also think we need to get in touch with Dr. Henreitta Leber office to see about getting the patient scheduled with him as soon as possible. 11/08/18 on evaluation today patient presents for follow-up concerning her bilateral foot and ankle ulcers. I did do an extensive review of her chart in epic today. Subsequently she was seen by [name] he did initiate Cefepime IV antibiotic therapy. Subsequently she had some issues with her PICC line this had to be removed because it was coiled and then replaced. Fortunately that was now settled. Unfortunately she has continued have issues with her left heel as well as the issues that she is  experiencing with her bilateral lateral malleolus regions. I do believe however both areas seem to be doing a little bit better on evaluation today which is good news. No fevers, chills, nausea, or vomiting noted at this time. She actually has an angiogram schedule with Dr. dew on this coming Monday, November 11, 2018. Subsequently the patient states that she is feeling much better especially than what she was roughly 2 weeks ago. She actually had to cancel an appointment because she was feeling so poorly. No fevers, chills, nausea, or vomiting noted at this time. 11/15/18 on evaluation today patient actually is status post having had her angiogram with Dr. dew Monday, four days ago. 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 He had to go and work on several areas of the vasculature fortunately he was able to obtain no more than a 30% residual stenosis throughout post procedure. I reviewed this note today. I think this will definitely help with healing at this time. Fortunately there does not appear to be any signs of infection and I do feel like ratio already has a better appearance to it. 11/22/18 upon evaluation today patient actually appears to be doing very well in regard to her wounds in general. The right lateral malleolus looks excellent the heel looks better in the left lateral malleolus also appears to be doing a little better. With that being said the right second toe actually appears to be open and training we been watching this is been dry and stable but now is open. 12/03/2018 Seen today for follow-up and management of multiple bilateral lower extremity wounds. New pressure injury of the great toe which is closed at this time. Wound of the right distal second toe appears larger today with deep undermining and a pocket of fluid present within the undermining region. Left and right malleolus is wounds are stable today with no signs and symptoms of infection.Denies  any needs or concerns during exam today. 12/13/18 on evaluation today patient appears to be doing somewhat better in regard to her left heel ulcer. She also seems to be completely healed in regard to the right lateral malleolus ulcer. The left malleolus ulcer is smaller what unfortunately the wounds which are new over the first and second toes of the right foot are what are most concerning at this point especially the second. Both areas did require sharp debridement today. 12/20/18 on evaluation today patient's wound actually appears to be doing better in regard to left lateral ankle and her right lateral ankle continues to remain healed. The hill ulcer on the left is improved. She does have improvement noted as well in  regard to both toe ulcers. Overall I'm very pleased in this regard. No fevers, chills, nausea, or vomiting noted at this time. 12/23/18 on evaluation today patient is seen after she had her toenails trimmed at the podiatrist office due to issues with her right great toe. There was what appeared to be dark eschar on the surface of the wound which had her in the podiatrist concerned. Nonetheless as I remember that during the last office visit I had utilize silver nitrate of this area I was much less concerned about the situation. Subsequently I was able to clean off much of this tissue without any complication today. This does not appear to show any signs of infection and actually look somewhat better [name], [name] ([id number]) compared to last time post debridement. Her second toe on the right foot actually had callous over and there did appear still be some fluid underneath this that would require debridement today. 12/27/18 on evaluation today patient actually appears to be showing signs of improvement at all locations. Even the left lateral ankle although this is not quite as great as the other sites. Fortunately there does not appear to be any signs of infection at this time and  both of her toes on the right foot seem to be showing signs of improvement which is good news and very pleased in this regard. 01/03/19 on evaluation today patient appears to be doing better for the most part in regard to her wounds in particular. There does not appear to be any evidence of infection at this time which is good news. Fortunately there is no sign of really worsening anywhere except for the right great toe which she does have what appears to be a bruise/deep tissue injury which is very superficial and already resolving. I'm not sure where this came from I questioned her extensively and she does not recall what may have happened with this. Other than that the patient seems to be doing well even the left lateral ankle ulcer looks good and is getting smaller. 01/10/19 on evaluation today patient appears to be doing well in regard to her left heel wound and both of her toe wounds. Overall I feel like there is definitely improvement here and I'm happy in that regard. With that being said unfortunately she is having issues with the left lateral malleolus ulcer which unfortunately still has a lot of depth to it. This is gonna be a very difficult wound for [place] to be able to truly get to heal. I may want to consider some type of skin substitute to see if this would be of benefit for her. I'll discuss this with her more the next visit most likely. This was something I thought about more at the end of the visit when I was Artie out of the room and the patient had been discharged. 01/17/19 on evaluation today patient appears to be doing very well in regard to her wounds in general. She's been making excellent progress at this time. Fortunately there's no sign of infection at this time either. No fevers, chills, nausea, or vomiting noted at this time. The biggest issue is still her left lateral malleolus where it appears to be doing well and is getting smaller but still shows a small corner where this is  deeper and goes down into what appears to be the joint space. Nonetheless this is taking much longer to heal although it still looks better in smaller than previous evaluations. 01/24/19 on evaluation today patient's wounds  actually appear to be doing rather well in general overall. She did require some sharp debridement in regard to the right great toe but everything else appears to be doing excellent no debridement was even necessary. No fevers, chills, nausea, or vomiting noted at this time. 01/31/19 on evaluation today patient actually appears to be doing much better in regard to her left foot wound on the heel as well as the ankle. The right great toe appears to be a little bit worse today this had callous over and trapped a lot of fluid underneath. Fortunately there's no signs of infection at any site which is great news. 02/07/19 on evaluation today patient actually appears to be doing decently well in regard to all of her ulcers at this point. No sharp debridement was required she is a little bit of hyper granulation in regard to the left lateral ankle as well as the left heel but the hill itself is almost completely healed which is excellent news. Overall been very pleased in this regard. 02/14/19 on evaluation today patient actually appears to be doing very well in regard to her ulcers on the right first toe, left lateral malleolus, and left heel. In fact the heel is almost completely healed at this point. The patient does not show any signs of infection which is good news. Overall very pleased with how things have progressed. 04/18/19 Telehealth Evaluation During the COVID-19 National Emergency: Verbal Consent: Obtained from patient Allergies: reviewed and the active list is current. Medication changes: patient has no current medication changes. COVID-19 Screening: 1. Have you traveled internationally or on a cruise ship in the last 14 dayso No 2. Have you had contact with someone with or  under investigation for COVID-19o No 3. Have you had a fever, cough, sore throat, or experiencing shortness of breatho No on evaluation today actually did have a visit with this patient through a telehealth encounter with her home health nurse. Subsequently it was noted that the patient actually appears to be doing okay in regard to her wounds both the right great toe as well as the left lateral malleolus have shown signs of improvement although this in your theme around the left lateral malleolus there eschar coverings for both locations. The question is whether or not they are actually close and whether or not home health needs to discharge the patient or not. Nonetheless my concern is this point obviously is that without actually seeing her and being able to evaluate this directly I cannot ensure that she is completely healed which is the question that I'm being asked. 04/22/19 on evaluation today patient presents for her first evaluation since last time I saw her which was actually February 14, 2019. I did do a telehealth visit last week in which point it was questionable whether or not she may be healed and had to bring her in today for confirmation. With that being said she does seem to be doing quite well at this point which is good news. There does not appear to be any drainage in the deed I believe her wounds may be healed. Readmission: 09/04/2019 on evaluation today patient appears to be doing unfortunately somewhat more poorly in regard to her left foot ulcer secondary to a wound that began on 08/21/2019 at least when she first noticed this. Fortunately she has not had any evidence of active infection at this time. Systemically. I also do not necessarily see any evidence of infection at the blister/wound site on the first metatarsal  head plantar aspect. This almost appears to be something that may have just rubbed inappropriately causing this to breakdown. They did not want a wait too long to  come in to be seen as again she had significant issues in the past with wounds that took quite a while to heal in fact it was close to 2 years. Nonetheless this does not appear to be quite that bad but again we do need to remove some of the necrotic tissue from the surface of the wound to tell exactly the extent. She does not appear to have any significant arterial disease at this point and again her last ABIs and TBI's are recorded above in the alert section her left ABI was 1.27 with a TBI of 0.72 to the right ABI 1.08 with a TBI of 0.39. Other than this the patient has been doing quite well since I last saw her and that was in May 2020. 09/11/2019 on evaluation today patient appeared to be doing very well with regard to her plantar foot ulcer on the left. In fact this appears to be almost completely healed which is awesome. That is after just 1 week of intervention. With that being said there is no signs of active infection at this time. ULYSSA, WALTHOUR ([id number]) 09/18/2019 on evaluation today patient actually appears to be doing excellent in fact she is completely healed based on what I am seeing at this point. Fortunately there is no signs of active infection at this time and overall patient is very pleased to hear that this area has healed so quickly. Readmission: 05/13/2021 upon evaluation today this patient presents for reevaluation here in the clinic. This is a wound that actually we previously took care of. She had 1 on the right ankle and the left the left turned out to be be harder due to to heal but nonetheless is doing great at this point as the right that has reopened and it was noted first just several weeks ago with a scab over it and came off in just the past few days. Fortunately there does not appear to be any obvious evidence of significant active infection at this time which is great news. No fevers, chills, nausea, vomiting, or diarrhea. The patient does have a history of  pacemaker along with being on Eliquis currently as well. There does not appear to be any signs of this interfering in any way with her wound. She does have swelling we previously had compression socks for her ordered but again it does not look like she wears these on a regular basis by any means. 05/26/2021 upon evaluation today patient appears to be doing well with regard to her wound which is actually showing signs of excellent improvement. There does not appear to be any signs of active infection which is great news and overall very pleased with where things stand today. No fevers, chills, nausea, vomiting, or diarrhea. 06/02/2021 upon evaluation today patient's wound actually showing signs of excellent improvement. Fortunately there does not appear to be any signs of active infection which is great news. I think the patient is making good progress with regard to her wounds in general. 06/09/2021 upon evaluation today patient appears to be doing excellent in regard to her wounds currently. Fortunately there does not appear to be any signs of active infection which is great news. No fevers, chills, nausea, vomiting, or diarrhea. Overall extremely pleased with where things stand today. I think the patient is making excellent progress. 06/16/2021  upon evaluation today patient appears to be doing well in regard to her wound. This is going require little bit of debridement today and that was discussed with the patient. Otherwise she seems to be doing quite well and I am actually very pleased with where things stand at this point. No fevers, chills, nausea, vomiting, or diarrhea. 06/23/2021 upon evaluation today patient appears to be doing well with regard to her wounds. She has been tolerating the dressing changes without complication. Fortunately there does not appear to be any evidence of infection and she has not had air in her home which she actually lives at an assisted living that got fixed this  morning. With that being said because of that her wrap has been extremely hot and bothersome for her over the past week. 06/30/2021 upon evaluation today patient is actually making excellent progress in regard to her ankle ulcer. She has been tolerating the dressing changes without complication and overall extremely pleased with where things stand there does not appear to be any evidence of active infection which is great news. No fevers, chills, nausea, vomiting, or diarrhea. 07/07/21 upon evaluation today patients and culture on the right actually appears to be doing quite well. There does not appear to be any signs of infection and overall very pleased with where things stand today. No fevers, chills, nausea, or vomiting noted at this time. 07/14/2021 unfortunately the patient today has some evidence of deep tissue injury and pressure getting to the ankle region. Again I am not exactly sure what is going on here but this is very similar to issues that we have had in the past. I explained to the patient that she needs to be very mindful of exactly what is happening I think sleeping in bed is probably the main issue here although there could be other culprits I am not sure what else would potentially lead to this kind of a problem for her. 07/21/2021 upon evaluation today patient's wound actually showing signs of improvement compared to last week. Fortunately there does not appear to be any signs of active infection which is great news and overall very pleased with where things stand in that regard. With that being said I do believe that she is continuing to show signs of overall of getting better although I think this is still basically about what we were 2 weeks ago due to the worsening and now improvement. 07/28/2021 upon evaluation today patient appears to be doing well with regard to her wound. She does have some slough buildup on the surface of the wound which I would have to manage today.  Fortunately there is no sign of active infection at this time. No fevers, chills, nausea, vomiting, or diarrhea. 08/04/2021 upon evaluation today patient appears to be doing about the same in regard to her wound. To be perfectly honest I am beginning to be a little bit concerned about the overall appearance of the wound bed. I do think possibly taking a sample right around the margin of the wound could be beneficial for her as far as identifying anything such as an inflammatory process or to be honest even a skin tag cancer type process that may be of concern here. Fortunately there does not appear to be any evidence of active infection at this time which is great news she is not having any pain also great news. 08/11/2021 upon evaluation today patient appears to be doing well with regard to her wound. The good news is I  did review her biopsy results and it showed some inflammatory mixed findings but nothing that appeared to be malignant which is great news. Overall this is more of a chronic venous stasis type issue which again is more what we have been treating. Nonetheless I just wanted to make sure before going forward that there was not anything more untoward going on at this point. 08/18/2021 upon evaluation today patient appears to be doing well with regard to her ankle ulcer. Fortunately there does not appear to be any signs of active infection at this time which is great overall wound is dramatically improved compared to last week. Since last week I have actually placed her on doxycycline and subsequently this is a good option as far as the findings are concerned at this point. 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 the doxycycline is doing that. There does not appear to be any evidence of active infection systemically which is great news. 08/25/2021 upon evaluation  today patient appears to be doing well with regard to her wound. I feel like we are finally get back on track as far as healing is concerned I am much happier with the overall appearance today. I do think that she is tolerating the dressing changes without complication which is great news. We have been using Hydrofera Blue which I think is a good option. The good news is she is also doing great in regard to her compression sock on the left which is a zipper compression that seems to be doing a great job keeping her edema under good control. 09/01/2021 upon evaluation today patient appears to be doing well with regard to her wound. Infection seems to be under much better control which is great news and very pleased in that regard. Fortunately there does not appear to be any signs of infection currently. LILYAHNA, SIRMON ([id number]) 09/08/2021 upon evaluation today patient actually appears to be making good progress in regard to her wound. She has been tolerating the dressing changes without complication. Fortunately there does not appear to be any evidence of active infection at this time which is great news as well. No fevers, chills, nausea, vomiting, or diarrhea. 09/22/2021 upon evaluation today patient appears to be doing well with regard to her wound although is very slow to heal. We have not looked into Apligraf yet I think that is something that we should see about doing. 09/29/2021 upon evaluation today patient's wound is actually showing signs of doing about the same. I am not seeing any evidence of worsening but also no significant evidence of improvement. We did gain approval for the organogenesis products all except for Apligraf as covered by her insurance. With that being said I do think that we can go ahead and proceed with the NuShield if the patient and her family in agreement of the plan I discussed that with him today she does have a 20% coinsurance which we also  discussed. 10/06/2021 upon evaluation today patient appears to unfortunately be doing a little bit worse she appears to be infected based on what I am seeing. Fortunately there does not appear to be any signs of active infection at this time which is great news. Unfortunately it does appear to be some evidence of around the wound edge indicated by way of erythema and warmth as well as redness 10/13/2021 upon evaluation  today patient actually appears to be doing excellent in regard to her ankle ulcer compared to what it was. 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 I think were fine to go ahead and apply the NuShield today. 10/20/2021 upon evaluation today patient appears to be doing excellent in fact the NuShield seems to have done all some for her thus far. I am actually very pleased with where we stand and overall I think that she is making great progress. There is no evidence of active infection at this time. 11/23; patient presents for follow-up. She has no issues or complaints today. She denies signs of infection. She reports stability in her wound healing. 11/03/2021 upon evaluation today patient appears to be doing well with regard to her wounds. She has been tolerating the dressing changes without complication. Fortunately there is no signs of active infection at this time. 11/10/2021 upon evaluation today patient appears to be doing well with regard to her right ankle which is actually showing signs of improvement with the NuShield I am very pleased. Subsequently in regards to the left ankle this appears to be doing okay with no evidence of issue here either. With that being said she has a significant contusion on the left leg from having fallen 2 days ago. She tells me that she was using her walker  going to the closet and then when she got to the closet turned around to get something out at which point she fell according to the story. Nonetheless she does have a hematoma just below her knee on the anterior portion of her shin. My hope is that this will not open until wound although we do need I think Some compression over it also think that we need to have her use an ice as well to help with the swelling and prevent this from getting worse. The last thing she needs is a wound opened up here. 11/17/2021 upon evaluation patient's right ankle actually showing signs of improvement based on what I see currently there is a lot of new skin growth coming in which is great news. Nonetheless I do feel like that the patient is showing improvement as well in regard to her left leg with the use of the Tubigrip it swollen but not as bruised as what I would have expected after what I was seeing last week. Nonetheless I do think doubling up on the Tubigrip would probably be beneficial to try to keep some of the edema under better control here. 11/24/2021 upon evaluation today patient appears to be doing excellent in regard to her wound. This is actually measuring significantly smaller which is great news. Fortunately I do not see any signs of active infection locally nor systemically at this point. Overall I am very pleased with how the NuShield is doing. 12/30; wound bed looks healthy however not much change in overall wound volume. We applied Nushield again today in the standard fashion 12/08/2021 upon evaluation today patient's wound actually showing signs of good improvement and I am actually very pleased with where we stand today as well. I do not see any evidence of active infection locally nor systemically which is great news. Unfortunately she has been having some issues with stomach upset and diarrhea today. 12/15/2021 upon evaluation today patient appears to be doing  excellent in regard to her wound.  There is a little bit of dry skin raised up around the edges of the wound but fortunately nothing too significant at this point. Fortunately I do not see any evidence of active infection either which is great news. No fevers, chills, nausea, vomiting, or diarrhea. 12/29/2021 upon evaluation today patient appears to be doing well with regard to her wound. In general I feel like she is getting very close to complete resolution. I think that she is making good progress here as well. Overall I do not see any signs of infection and very little of this appears to actually be open. 01/12/2022 upon evaluation today patient appears to be doing a little bit worse in regard to her wound. It appears that the collagen actually trapped fluid underneath the collagen which got hard and subsequently caused the fluid to back up. This patient has made the wound appear to be larger than what it was previous. With that being said I do not see any signs of obvious infection is not warm to touch and not significantly erythematous either which is good news. Nonetheless I do think that we will need to continue to keep an eye on this. Probably not go put on antibiotic this week but I will be too far from doing so she is actually on a Z-Pak right now I do not want to really double up on antibiotics. 01/26/2022 upon evaluation today patient's wound is showing signs of improvement although this is slow to turn back around. Fortunately there does not appear to be any evidence of active infection locally or systemically which is great news and overall I am extremely pleased with where we stand today. No fevers, chills, nausea, vomiting, or diarrhea. 02/02/2022 upon evaluation today patient appears to be doing well with regard to her wound. She has been tolerating the dressing changes without complication. Fortunately there does not appear to be any signs of active infection locally nor systemically at this time which is great news.  No fevers, chills, nausea, vomiting, or diarrhea. 3/9; patient presents for follow-up. She has no issues or complaints today. She denies signs of infection. 02/16/2022 upon evaluation today patient appears to be doing well with regard to her wound. She has been tolerating the dressing changes without complication. Fortunately I do not see any evidence of active infection locally nor systemically at this point which is great news. Overall I BERYL, HORNBERGER ([id number]) think that the patient is making excellent progress in general. The wound is measuring smaller and though there does appear to be some need for sharp debridement today this is minimal compared to what we have noted previous. 02/23/2022 upon evaluation today patient appears to be doing well with regard to her wound she is definitely showing signs of improvement which is great news. 03/02/2022 upon evaluation today patient appears to be doing about the same in regard to her wound. Unfortunately were not seeing significant improvement. With that being said is also not significantly worse which is great news. No fevers, chills, nausea, vomiting, or diarrhea. 03-09-2022 upon evaluation today patient appears to be doing well with regard to her wound. She has been tolerating the dressing changes without complication. Fortunately there does not appear to be any signs of active infection locally or systemically which is great news. 03-16-2022 upon evaluation today patient appears to be doing well with regard to her wound this is measuring smaller and looking much better. I am actually very pleased with  where we stand and I think that the patient is making great progress. I do not see any evidence of active infection locally or systemically which is great news. No fevers, chills, nausea, vomiting, or diarrhea. 03-23-2022 upon evaluation today patient appears to be doing better in regard to her wounds. In fact the wound area actually is showing signs  of excellent improvement and actually very pleased with where we stand today. I do not see any evidence of active infection locally nor systemically which is great news. 4/27; comes in today with again thick callus around the wound circumference which I removed with a curette. Some debris on the surface. Overall I do not think quite as good as it was last week by description. Using endoform 04-06-2022 upon evaluation today patient appears to be doing pretty well in regard to her wound. Fortunately I do not see any evidence of active infection locally or systemically which is great news and overall I am pleased in that regard. I do feel like this is measuring smaller postdebridement compared to where we were previous. Overall she is headed in the right direction. She does have an area that is threatening to open and looks a little irritated on the left ankle we did measure this just for the discolored area for now its not draining too much but we want to monitor and make sure nothing worsens here. 04-13-2022 upon evaluation today patient appears to be doing better in regard to her wound although this is still very slow to heal. We have reapplied for a skin substitute but have not heard anything back as of yet. Fortunately I do not see any evidence of active infection locally or systemically at this time which is great news. 04-20-2022 upon evaluation today patient's wound is actually showing signs of significant improvement which is great news. We did get approval for skin substitutes which I think is an option here for [place] but at the same time I am not even certain that this is going to be necessary especially considering how well things seem to be doing at this point. If she continues healing as we see her right now but I think were probably going to be able to avoid any need for additional skin substitute. Patient and her daughter are both in agreement with that plan. With that being said she did have  a fall she has several areas of contusion fortunately nothing that is can require intervention at this point but nonetheless she did also fracture her rib which is extremely uncomfortable obviously. 04-27-2022 upon evaluation today patient appears to be doing well currently in regard to her wound on the right lateral ankle. Overall I feel like she is actually doing significantly better even compared to last week and very pleased in this regard. I think we are on the right track here. 05-04-2022 upon evaluation today patient appears to be doing well with regard to her wound. She is going require some sharp debridement today to clear away some of the necrotic debris. Fortunately I was able to do this quite easily without significant issue or breakdown here. There is a very small area of opening centrally but really this is pretty close to getting close. 05-11-2022 upon evaluation today patient's wound is actually showing signs of excellent improvement. I do not see any evidence of infection locally or systemically at this time. Overall I think she is very close to complete resolution with just a little bit of debridement to clear away some  of the dry crusty area around the edges of the wound currently. 05-18-2022 upon evaluation patient's wound bed actually appears to be likely healed although I cannot be 100% sure I am extremely pleased however with where things stand today. I do not see any evidence of infection locally or systemically which is great news and overall I think you are on the right track here. 05-25-2022 upon evaluation today patient appears to be doing well with regard to her leg ulcer. Fortunately there does not appear to be any signs of active infection locally or systemically at this time which is great news. No fevers, chills, nausea, vomiting, or diarrhea. With that being said she does appear to be healed in regard to her right lateral ankle which is great news. In regard to her leg  unfortunately during the removal of the compression wrap there was a small skin tear that occurred with the scissors. Subsequently the patient does have a minimal amount of bleeding this is going require a couple Steri-Strips in order to seal this up. I do think it should likely heal quite well. Obviously this was unintentional. Nonetheless we do have to definitely monitor make sure that this completely heals up as well I am hopeful that we will be the case by next week to be honest. 06-01-2022 upon evaluation today patient appears to be doing excellent in regard to her wounds. Both areas are completely healed which is great news. Fortunately I do not see any signs of active infection locally or systemically at this time which is great and overall I am extremely pleased in that regard. No fevers, chills, nausea, vomiting, or diarrhea. 7/25; patient returns to clinic today with a story that 2 to 3 days ago she was sitting in her chair and she noticed bleeding from her left lower leg lateral aspect. She denies any trauma. She used pressure and a Band-Aid to get it to stop and she arrives in clinic today in follow-up. She was discharged on 6/29 apparently to stockings although she did not have any on and I do not think she was actually wearing them. She has significant chronic venous insufficiency 07-04-2022 upon evaluation today patient presents for follow-up. In the clinic she actually was seen last week when I was on vacation due to an area on her leg which reopened unfortunately. With that being said she does seem to be doing a little bit better but unfortunately this is still giving her some trouble here. All of her other wounds are still closed which I was previously seen her for and that is great news. 07-13-2022 upon evaluation today patient appears to be doing well with regard to her wound everything appears to be healed unfortunately she has a completely new area on her left heel which is a  blister. Fortunately I do not see any signs of infection but unfortunately I do think this is going to be ongoing issue at this time. Question has been made whether or not we can get this to reabsorb or not. Again right now I am going to [name], [name]. ([id number]) treat this more as a deep tissue injury currently. 07-20-2022 upon evaluation today patient's wound is actually showing signs of being somewhat worse I definitely think she is infected at this point. We will get a need to subsequently have her started on antibiotic as soon as possible. She voiced understanding and her daughter is in agreement with this plan. We were hoping that we would get the blister  reabsorb that just did not happen. Electronic Signature(s) Signed: 07/20/2022 5:09:25 PM By: [name] Keeler PA-C Entered By: [name] on 07/20/2022 17:09:25 [name], [name] ([id number]) -------------------------------------------------------------------------------- Physical Exam Details Patient Name: [name] Date of Service: 07/20/2022 9:45 AM Medical Record Number: [id number] Patient Account Number: [id number] Date of Birth/Sex: 07-04-1923 (88 y.o. F) Treating RN: [name] Primary Care Provider: Adrian [name] Other Clinician: Massie [name] Referring Provider: Adrian [name] Treating Provider/Extender: [name] in Treatment: 22 Constitutional Well-nourished and well-hydrated in no acute distress. Respiratory normal breathing without difficulty. Psychiatric this patient is able to make decisions and demonstrates good insight into disease process. Alert and Oriented x 3. pleasant and cooperative. Notes Upon inspection patient's wound bed actually showed signs of good granulation and epithelization for the most part there was some slough and biofilm noted however there was a tremendous amount of drainage and this definitely appears to be infected based on what we are seeing.  My suggestion at this point is good to be that we go ahead and initiate treatment with the doxycycline which has done well for her in the past. I think this is a good option. Electronic Signature(s) Signed: 07/20/2022 5:09:52 PM By: [name] Keeler PA-C Entered By: [name] on 07/20/2022 17:09:51 [name], [name] ([id number]) -------------------------------------------------------------------------------- Physician Orders Details Patient Name: [name] Date of Service: 07/20/2022 9:45 AM Medical Record Number: [id number] Patient Account Number: [id number] Date of Birth/Sex: 11-07-1923 (88 y.o. F) Treating RN: [name] Primary Care Provider: Adrian [name] Other Clinician: Massie [name] Referring Provider: Adrian [name] Treating Provider/Extender: [name] in Treatment: 21 Verbal / Phone Orders: No Diagnosis Coding ICD-10 Coding Code Description [phone] Non-pressure chronic ulcer of other part of left lower leg with other specified severity S80.12XD Contusion of left lower leg, subsequent encounter L89.626 Pressure-induced deep tissue damage of left heel I89.0 Lymphedema, not elsewhere classified I87.2 Venous insufficiency (chronic) (peripheral) Z95.0 Presence of cardiac pacemaker Z79.01 Long term (current) use of anticoagulants Follow-up Appointments o Return Appointment in 1 week. Bathing/ Shower/ Hygiene o May shower with wound dressing protected with water repellent cover or cast protector. o No tub bath. Anesthetic (Use 'Patient Medications' Section for Anesthetic Order Entry) o Lidocaine applied to wound bed Edema Control - Lymphedema / Segmental Compressive Device / Other o Tubigrip single layer applied. o Elevate, Exercise Daily and Avoid Standing for Long Periods of Time. o Elevate legs to the level of the heart and pump ankles as often as possible o Elevate leg(s) parallel to the floor when sitting. Non-Wound  Condition Left Lower Extremity o Cleanse affected area with antibacterial soap and water, Wound Treatment Wound #11 - Calcaneus Wound Laterality: Left Cleanser: Byram Ancillary Kit - 15 Day Supply (DME) (Generic) 3 x Per Week/30 Days Discharge Instructions: Use supplies as instructed; Kit contains: (15) Saline Bullets; (15) 3x3 Gauze; 15 pr Gloves Primary Dressing: Silvercel 4 1/4x 4 1/4 (in/in) (DME) (Generic) 3 x Per Week/30 Days Discharge Instructions: Apply Silvercel 4 1/4x 4 1/4 (in/in) as instructed Secondary Dressing: Zetuvit Plus 4x4 (in/in) (DME) (Generic) 3 x Per Week/30 Days Secured With: Conform 4'' - Conforming Stretch Gauze Bandage 4x75 (in/in) 3 x Per Week/30 Days Discharge Instructions: Apply as directed Secured With: Tubigrip Size C, 2.75x10 (in/yd) 3 x Per Week/30 Days Discharge Instructions: Apply 3 Tubigrip C 3-finger-widths below knee to base of toes to secure dressing and/or for swelling. Wound #12 - Ankle Wound Laterality: Left, Lateral Cleanser: Byram Ancillary Kit -  15 Day Supply (DME) (Generic) 3 x Per Week/30 Days Discharge Instructions: Use supplies as instructed; Kit contains: (15) Saline Bullets; (15) 3x3 Gauze; 15 pr Gloves Primary Dressing: Silvercel 4 1/4x 4 1/4 (in/in) (DME) (Generic) 3 x Per Week/30 Days Discharge Instructions: Apply Silvercel 4 1/4x 4 1/4 (in/in) as instructed Toda, [name] ([id number]) Secondary Dressing: Zetuvit Plus 4x4 (in/in) (DME) (Generic) 3 x Per Week/30 Days Secured With: Conform 4'' - Conforming Stretch Gauze Bandage 4x75 (in/in) 3 x Per Week/30 Days Discharge Instructions: Apply as directed Secured With: Tubigrip Size C, 2.75x10 (in/yd) 3 x Per Week/30 Days Discharge Instructions: Apply 3 Tubigrip C 3-finger-widths below knee to base of toes to secure dressing and/or for swelling. Laboratory o Bacteria identified in Wound by Culture (MICRO) - wound culture obtained today oooo LOINC Code: 7106-2 oooo Convenience Name:  Wound culture routine Patient Medications Allergies: Sulfa (Sulfonamide Antibiotics), metronidazole, monistat Notifications Medication Indication Start End doxycycline hyclate 07/20/2022 DOSE 1 - oral 100 mg capsule - 1 capsule oral taken 2 times per day for 14 days Electronic Signature(s) Signed: 07/20/2022 11:14:57 AM By: [name] Keeler PA-C Entered By: [name] on 07/20/2022 11:14:56 Maler, [name] ([id number]) -------------------------------------------------------------------------------- Problem List Details Patient Name: [name] Date of Service: 07/20/2022 9:45 AM Medical Record Number: [id number] Patient Account Number: [id number] Date of Birth/Sex: [birth year] (88 y.o. F) Treating RN: [name] Primary Care Provider: Adrian [name] Other Clinician: Massie [name] Referring Provider: Adrian [name] Treating Provider/Extender: [name] in Treatment: 47 Active Problems ICD-10 Encounter Code Description Active Date MDM Diagnosis L97.828 Non-pressure chronic ulcer of other part of left lower leg with other 06/27/2022 No Yes specified severity S80.12XD Contusion of left lower leg, subsequent encounter 06/27/2022 No Yes L89.623 Pressure ulcer of left heel, stage 3 07/13/2022 No Yes I89.0 Lymphedema, not elsewhere classified 05/13/2021 No Yes I87.2 Venous insufficiency (chronic) (peripheral) 05/13/2021 No Yes Z95.0 Presence of cardiac pacemaker 05/13/2021 No Yes Z79.01 Long term (current) use of anticoagulants 05/13/2021 No Yes Inactive Problems ICD-10 Code Description Active Date Inactive Date 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, stage 3 05/13/2021 05/13/2021 S81.811A Laceration without foreign body, right lower leg, initial encounter 05/25/2022 05/25/2022 S80.12XA Contusion of left lower leg, initial encounter 11/10/2021 11/10/2021 Resolved Problems Electronic Signature(s) Signed: 07/20/2022 5:17:00 PM By: [name] Keeler PA-C Previous Signature:  07/20/2022 10:01:45 AM Version By: [name] Keeler PA-C Entered By: [name] on 07/20/2022 17:17:00 [name], [name] ([id number]) [name], [name] ([id number]) -------------------------------------------------------------------------------- Progress Note Details Patient Name: [name] Date of Service: 07/20/2022 9:45 AM Medical Record Number: [id number] Patient Account Number: [id number] Date of Birth/Sex: Apr 29, 1923 (88 y.o. 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 Dr. Amalia Hailey the podiatrist saw her on 07/20/2017 and sent her to the wound center for possible hyperbaric oxygen therapy. past medical history of peripheral vascular disease, varicose veins, status post appendectomy, basal cell carcinoma excision from the left leg, cholecystectomy, pacemaker placement, right lower extremity angiography done by Dr. dew in March 2017 with placement of a stent. there is also note of a successful ablation of the  right small saphenous vein done which was reviewed by ultrasound on 10/24/2016. the patient had a right small saphenous vein ablation done on 10/20/2016. The patient has never been a smoker. She has been seen by Dr. Corene Cornea dew the vascular surgeon who most recently saw her on 06/15/2017 for evaluation of ongoing problems with right leg swelling. She had a lower extremity arterial duplex examination done(02/13/17) which showed patent distal right superficial femoral artery  stent and above-the-knee popliteal stent without evidence of restenosis. The ABI was more than 1.3 on the right and more than 1.3 on the left. This was consistent with noncompressible arteries due to medial calcification. The right great toe pressure and PPG waveforms are within normal limits and the left great toe pressure and PPG waveforms are decreased. he recommended she continue to wear her compression stockings and continue with elevation. She is scheduled to have a noninvasive arterial study in the near future 08/16/2017 -- had a lower extremity arterial duplex examination done which showed patent distal right superficial femoral artery stent and above-the-knee popliteal stent without evidence of restenosis. The ABI was more than 1.3 on the right and more than 1.3 on the left. This was consistent with noncompressible arteries due to medial calcification. The right great toe pressure and PPG waveforms are within normal limits and the left great toe pressure and PPG waveforms are decreased. the x-ray of the right ankle has not yet been done 08/24/2017 -- had a right ankle x-ray -- IMPRESSION:1. No fracture, bone lesion or evidence of osteomyelitis. 2. Lateral soft tissue swelling with a soft tissue ulcer. she has not yet seen the vascular surgeon for review 08/31/17 on evaluation today patient's wound appears to be showing signs of improvement. She still with her appointment with vascular in order to review her results of her vascular study and then determine if any intervention would be recommended at that time. No fevers, chills, nausea, or vomiting noted at this time. She has been tolerating the dressing changes without complication. 09/28/17 on evaluation today patient's wound appears to show signs of good improvement in regard to the granulation tissue which is surfacing. There is still a layer of slough covering the wound and the posterior portion is still significantly deeper than the  anterior nonetheless there has been some good sign of things moving towards the better. She is going to go back to Dr. dew for reevaluation to ensure her blood flow is still appropriate. That will be before her next evaluation with [place] next week. No fevers, chills, nausea, or vomiting noted at this time. Patient does have some discomfort rated to be a 3-4/10 depending on activity specifically cleansing the wound makes it worse. 10/05/2017 -- the patient was seen by Dr. Lucky Cowboy last week and noninvasive studies showed a normal right ABI with brisk triphasic waveforms consistent with no arterial insufficiency including normal digital pressures. The duplex showed a patent distal right SFA stent and the proximal SFA was also normal. He was pleased with her test and thought she should have enough of perfusion for normal wound healing. He would see her back in 6 months time. 12/21/17 on evaluation today patient appears to be doing fairly well in regard to her right lateral ankle wound. Unfortunately the main issue that she is expansion at this point is that she is having some issues with what appears to be some cellulitis in the right anterior shin. She has also been noting a little bit of uncomfortable feeling especially last night  and her ankle area. I'm afraid that she made the developing a little bit of an infection. With that being said I think it is in the early stages. 12/28/17 on evaluation today patient's ankle appears to be doing excellent. She's making good progress at this point the cellulitis seems to have improved after last week's evaluation. Overall she is having no significant discomfort which is excellent news. She does have an appointment with Dr. dew on March 29, 2018 for reevaluation in regard to the stent he placed. She seems to have excellent blood flow in the right lower extremity. 01/19/12 on evaluation today patient's wound appears to be doing very well. In fact she does not appear to  require debridement at this point, there's no evidence of infection, and overall from the standpoint of the wound she seems to be doing very well. With that being said I believe that it may be time to switch to different dressing away from the [facility] Dressing she tells me she does have a lot going on her friend actually passed away yesterday and she's also having a lot of issues with her husband this obviously is weighing heavy on her as far as your thoughts and concerns today. 01/25/18 on evaluation today patient appears to be doing fairly well in regard to her right lateral malleolus. She has been tolerating the dressing changes without complication. Overall I feel like this is definitely showing signs of improvement as far as how the overall appearance of the wound is there's also evidence of epithelium start to migrate over the granulation tissue. In general I think that she is progressing nicely as far as the wound is concerned. The only concern she really has is whether or not we can switch to every other week visits in order to avoid having as many KHRISTA, BRAUN ([id number]) appointments as her daughters have a difficult time getting her to her appointments as well as the patient's husband to his he is not doing very well at this point. 02/22/18 on evaluation today patient's right lateral malleolus ulcer appears to be doing great. She has been tolerating the dressing changes without complication. Overall you making excellent progress at this time. Patient is having no significant discomfort. 03/15/18 on evaluation today patient appears to be doing much more poorly in regard to her right lateral ankle ulcer at this point. Unfortunately since have last seen her her husband has passed just a few days ago is obviously weighed heavily on her her daughter also had surgery well she is with her today as usual. There does not appear to be any evidence of infection she does seem to have  significant contusion/deep tissue injury to the right lateral malleolus which was not noted previous when I saw her last. It's hard to tell of exactly when this injury occurred although during the time she was spending the night in the hospital this may have been most likely. 03/22/18 on evaluation today patient appears to actually be doing very well in regard to her ulcer. She did unfortunately have a setback which was noted last week however the good news is we seem to be getting back on track and in fact the wound in the core did still have some necrotic tissue which will be addressed at this point today but in general I'm seeing signs that things are on the up and up. She is glad to hear this obviously she's been somewhat concerned that due to the how her wound digressed more  recently. 03/29/18 on evaluation today patient appears to be doing fairly well in regard to her right lower extremity lateral malleolus ulcer. She unfortunately does have a new area of pressure injury over the inferior portion where the wound has opened up a little bit larger secondary to the pressure she seems to be getting. She does tell me sometimes when she sleeps at night that it actually hurts and does seem to be pushing on the area little bit more unfortunately. There does not appear to be any evidence of infection which is good news. She has been tolerating the dressing changes without complication. She also did have some bruising in the left second and third toes due to the fact that she may have bump this or injured it although she has neuropathy so she does not feel she did move recently that may have been where this came from. Nonetheless there does not appear to be any evidence of infection at this time. 04/12/18 on evaluation today patient's wound on the right lateral ankle actually appears to be doing a little bit better with a lot of necrotic docking tissue centrally loosening up in clearing away. However she  does have the beginnings of a deep tissue injury on the left lateral malleolus likely due to the fact we've been trying offload the right as much as we have. I think she may benefit from an assistive soft device to help with offloading and it looks like they're looking at one of the doughnut conditions that wraps around the lower leg to offload which I think will definitely do a good job. With that being said I think we definitely need to address this issue on the left before it becomes a wound. Patient is not having significant pain. 04/19/18 on evaluation today patient appears to be doing excellent in regard to the progress she's made with her right lateral ankle ulcer. The left ankle region which did show evidence of a deep tissue injury seems to be resolving there's little fluid noted underneath and a blister there's nothing open at this point in time overall I feel like this is progressing nicely which is good news. She does not seem to be having significant discomfort at this point which is also good news. 04/25/18-She is here in follow up evaluation for bilateral lateral malleolar ulcers. The right lateral malleolus ulcer with pale subcutaneous tissue exposure, central area of ulcer with tendon/periosteum exposed. The left lateral malleolus ulcer now with central area of nonviable tissue, otherwise deep tissue injury. She is wearing compression wraps to the left lower extremity, she will place the right lower extremity compression wraps on when she gets home. She will be out of town over the weekend and return next week and follow-up appointment. She completed her doxycycline this morning 05/03/18 on evaluation today patient appears to be doing very well in regard to her right lateral ankle ulcer in general. At least she's showing some signs of improvement in this regard. Unfortunately she has some additional injury to the left lateral malleolus region which appears to be new likely even over the  past several days. Again this determination is based on the overall appearance. With that being said the patient is obviously frustrated about this currently. 05/10/18-She is here in follow-up evaluation for bilateral lateral malleolar ulcers. She states she has purchased offloading shoes/boots and they will arrive tomorrow. She was asked to bring them in the office at next week's appointment so her provider is aware of product being  utilized. She continues to sleep on right or left side, she has been encouraged to sleep on her back. The right lateral malleolus ulcer is precariously close to peri-osteum; will order xray. The left lateral malleolus ulcer is improved. Will switch back to santyl; she will follow up next week. 05/17/18 on evaluation today patient actually appears to be doing very well in regard to her malleolus her ulcers compared to last time I saw them. She does not seem to have as much in the way of contusion at this point which is great news. With that being said she does continue to have discomfort and I do believe that she is still continuing to benefit from the offloading/pressure reducing boots that were recommended. I think this is the key to trying to get this to heal up completely. 05/24/18 on evaluation today patient actually appears to be doing worse at this point in time unfortunately compared to her last week's evaluation. She is having really no increased pain which is good news unfortunately she does have more maceration in your theme and noted surrounding the right lateral ankle the left lateral ankle is not really is erythematous I do not see signs of the overt cellulitis on that side. Unfortunately the wounds do not seem to have shown any signs of improvement since the last evaluation. She also has significant swelling especially on the right compared to previous some of this may be due to infection however also think that she may be served better while she has these wounds  by compression wrapping versus continuing to use the Juxta-Lite for the time being. Especially with the amount of drainage that she is experiencing at this point. No fevers, chills, nausea, or vomiting noted at this time. 05/31/18 on evaluation today patient appears to actually be doing better in regard to her right lateral lower extremity ulcer specifically on the malleolus region. She has been tolerating the antibiotic without complication. With that being said she still continues to have issues but a little bit of redness although nothing like she what she was experiencing previous. She still continues to pressure to her ankle area she did get the problem on offloading boots unfortunately she will not wear them she states there too uncomfortable and she can't get in and out of the bed. Nonetheless at this point her wounds seem to be continually getting worse which is not what we want I'm getting somewhat concerned about her progress and how things are going to proceed if we do not intervene in some way shape or form. I therefore had a very lengthy conversation today about offloading yet again and even made a specific suggestion for switching her to a memory foam mattress and even gave the information for a specific one that they could look at getting if it was something that they were interested in considering. She does not want to be considered for a hospital bed air mattress although honestly insurance would not cover it that she does not have any wounds on her trunk. 06/14/18 on evaluation today both wounds over the bilateral lateral malleolus her ulcers appear to be doing better there's no evidence of pressure injury at this point. She did get the foam mattress for her bed and this does seem to have been extremely beneficial for her in my pinion. Her daughter states that she is having difficulty getting out of bed because of how soft it is. The patient also relates this to be. Nonetheless I do  feel like  she's actually doing better. Unfortunately right after and around the time she was getting the mattress she also sustained a fall when she got up to go pick up the phone and ended up injuring her right elbow she has 18 sutures in place. We are not caring for this currently although home health is going to be taking the sutures out shortly. Nonetheless this may be something that we need to evaluate going forward. It depends on BIONCA, MCKEY ([id number]) how well it has or has not healed in the end. She also recently saw an orthopedic specialist for an injection in the right shoulder just before her fall unfortunately the fall seems to have worsened her pain. 06/21/18 on evaluation today patient appears to be doing about the same in regard to her lateral malleolus ulcers. Both appear to be just a little bit deeper but again we are clinging away the necrotic and dead tissue which I think is why this is progressing towards a deeper realm as opposed to improving from my measurement standpoint in that regard. Nonetheless she has been tolerating the dressing changes she absolutely hates the memory foam mattress topper that was obtained for her nonetheless I do believe this is still doing excellent as far as taking care of excess pressure in regard to the lateral malleolus regions. She in fact has no pressure injury that I see whereas in weeks past it was week by week I was constantly seeing new pressure injuries. Overall I think it has been very beneficial for her. 07/03/18; patient arrives in my clinic today. She has deep punched out areas over her bilateral lateral malleoli. The area on the right has some more depth. We spent a lot of time today talking about pressure relief for these areas. This started when her daughter asked for a prescription for a memory foam mattress. I have never written a prescription for a mattress and I don't think insurances would pay for that on an ordinary bed.  In any case he came up that she has foam boots that she refuses to wear. I would suggest going to these before any other offloading issues when she is in bed. They say she is meticulous about offloading this the rest of the day 07/10/18- She is seen in follow-up evaluation for bilateral, lateral malleolus ulcers. There is no improvement in the ulcers. She has purchased and is sleeping on a memory foam mattress/overlay, she has been using the offloading boots nightly over the past week. She has a follow up appointment with vascular medicine at the end of October, in my opinion this follow up should be expedited given her deterioration and suboptimal TBI results. We will order plain film xray of the left ankle as deeper structures are palpable; would consider having MRI, regardless of xray report(s). The ulcers will be treated with iodoflex/iodosorb, she is unable to safely change the dressings daily with santyl. 07/19/18 on evaluation today patient appears to be doing in general visually well in regard to her bilateral lateral malleolus ulcers. She has been tolerating the dressing changes without complication which is good news. With that being said we did have an x-ray performed on 07/12/18 which revealed a slight loosen see in the lateral portion of the distal left fibula which may represent artifact but underline lytic destruction or osteomyelitis could not be excluded. MRI was recommended. With that being said we can see about getting the patient scheduled for an MRI to further evaluate this area. In fact we  have that scheduled currently for August 20 19,019. 07/26/18 on evaluation today patient's wound on the right lateral ankle actually appears to be doing fairly well at this point in my pinion. She has made some good progress currently. With that being said unfortunately in regard to the left lateral ankle ulcer this seems to be a little bit more problematic at this time. In fact as I further  evaluated the situation she actually had bone exposed which is the first time that's been the case in the bone appear to be necrotic. Currently I did review patient's note from Dr. Bunnie Domino office with [facility]. He stated that ABI was 1.26 on the right and 0.95 on the left with good waveforms. Her perfusion is stable not reduced from previous studies and her digital waveforms were pretty good particularly on the right. His conclusion upon review of the note was that there was not much she could do to improve her perfusion and he felt she was adequate for wound healing. His suggestion was that she continued to see [place] and consider a synthetic skin graft if there was no underlying infection. He plans to see her back in six months or as needed. 08/01/18 on evaluation today patient appears to be doing better in regard to her right lateral ankle ulcer. Her left lateral ankle ulcer is about the same she still has bone involvement in evidence of necrosis. There does not appear to be evidence of infection at this time On the right lateral lower extremity. I have started her on the Augmentin she picked this up and started this yesterday. This is to get her through until she sees infectious disease which is scheduled for 08/12/18. 08/06/18 on evaluation today patient appears to be doing rather well considering my discussion with patient's daughter at the end of last week. The area which was marked where she had erythema seems to be improved and this is good news. With that being said overall the patient seems to be making good improvement when it comes to the overall appearance of the right lateral ankle ulcer although this has been slow she at least is coming around in this regard. Unfortunately in regard to the left lateral ankle ulcer this is osteomyelitis based on the pathology report as well is bone culture. Nonetheless we are still waiting CT scan. Unfortunately the MRI we originally  ordered cannot be performed as the patient is a pacemaker which I had overlooked. Nonetheless we are working on the CT scan approval and scheduling as of now. She did go to the hospital over the weekend and was placed on IV Cefzo for a couple of days. Fortunately this seems to have improved the erythema quite significantly which is good news. There does not appear to be any evidence of worsening infection at this time. She did have some bleeding after the last debridement therefore I did not perform any sharp debridement in regard to left lateral ankle at this point. Patient has been approved for a snap vac for the right lateral ankle. 08/14/18; the patient with wounds over her bilateral lateral malleoli. The area on the right actually looks quite good. Been using a snap back on this area. Healthy granulation and appears to be filling in. Unfortunately the area on the left is really problematic. 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 Also noted to have cellulitis. She saw Dr. Novella Olive of  infectious disease today and was put on linezolid. We are able to verify this with her pharmacy. She is completed the Augmentin that she was already on. We've been using Iodoflex to this area 08/23/18 on evaluation today patient's wounds both actually appear to be doing better compared to my prior evaluations. Fortunately she showing signs of good improvement in regard to the overall wound status especially where were using the snap vac on the right. In regard to left lateral malleolus the wound bed actually appears to be much cleaner than previously noted. I do not feel any phone directly probed during evaluation today and though there is tendon noted this does not appear to be necrotic it's actually fairly good as far as the overall appearance of the tendon is concerned. In general the wound bed actually appears to be doing  significantly better than it was previous. Patient is currently in the care of [name] and I did review that note today. He actually has her on two weeks of linezolid and then following the patient will be on 1-2 months of Keflex. That is the plan currently. She has been on antibiotics therapy as prescribed by myself initially starting on July 30, 2018 and has been on that continuously up to this point. 08/30/18 on evaluation today patient actually appears to be doing much better in regard to her right lateral malleolus ulcer. She has been tolerating the dressing changes specifically the snap vac without complication although she did have some issues with the seal currently. Apparently there was some trouble with getting it to maintain over the past week past Sunday. Nonetheless overall the wound appears better in regard to the right lateral malleolus region. In regard to left lateral malleolus this actually show some signs of additional granulation although there still tendon noted in the base of the wound this appears to be healthy not necrotic in any way whatsoever. We are considering potentially using a snap vac for the left lateral malleolus as well the product wrap from [employer], [place], was present in the clinic today we're going to see this patient I did have her come in with me after obtaining consent from the patient and her daughter in order to look at the wound and see if there's any recommendation one way or another as to whether or not they felt the snapback could be beneficial for the left lateral malleolus region. But MELISSE, CAETANO ([id number]) the conclusion was that it might be but that this is definitely a little bit deeper wound than what traditionally would be utilized for a snap vac. 09/06/18 on evaluation today patient actually appears to be doing excellent in my pinion in regard to both ankle ulcers. She has been tolerating the dressing changes without complication which is  great news. Specifically we have been using the snap vac. In regard to the right ankle I'm not even sure that this is going to be necessary for today and following as the wound has filled in quite nicely. In regard to the left ankle I do believe that we're seeing excellent epithelialization from the edge as well as granulation in the central portion the tendon is still exposed but there's no evidence of necrotic bone and in general I feel like the patient has made excellent progress even compared to last week with just one week of the snap vac. 09/11/18; this is a patient who has wounds on her bilateral lateral malleoli. Initially both of these were deep stage IV wounds in  the setting of chronic arterial insufficiency. She has been revascularized. As I understand think she been using snap vacs to both of these wounds however the area on the right became more superficial and currently she is only using it on the left. Using silver collagen on the right and silver collagen under the back on the left I believe 09/19/18 on evaluation today patient actually appears to be doing very well in regard to her lateral malleolus or ulcers bilaterally. She has been tolerating the dressing changes without complication. Fortunately there does not appear to be any evidence of infection at this time. Overall I feel like she is improving in an excellent manner and I'm very pleased with the fact that everything seems to be turning towards the better for her. This has obviously been a long road. 09/27/18 on evaluation today patient actually appears to be doing very well in regard to her bilateral lateral malleolus ulcers. She has been tolerating the dressing changes without complication. Fortunately there does not appear to be any evidence of infection at this time which is also great news. No fevers, chills, nausea, or vomiting noted at this time. Overall I feel like she is doing excellent with the snap vac on the  left malleolus. She had 40 mL of fluid collection over the past week. 10/04/18 on evaluation today patient actually appears to be doing well in regard to her bilateral lateral malleolus ulcers. She continues to tolerate the dressing changes without complication. One issue that I see is the snap vac on the left lateral malleolus which appears to have sealed off some fluid underlying this area and has not really allowed it to heal to the degree that I would like to see. For that reason I did suggest at this point we may want to pack a small piece of packing strip into this region to allow it to more effectively wick out fluid. 10/11/18 in general the patient today does not feel that she has been doing very well. She's been a little bit lethargic and subsequently is having bodyaches as well according to what she tells me today. With that being said overall she has been concerned with the fact that something may be worsening although to be honest her wounds really have not been appearing poorly. She does have a new ulcer on her left heel unfortunately. This may be pressure related. Nonetheless it seems to me to have potentially started at least as a blister I do not see any evidence of deep tissue injury. In regard to the left ankle the snap vac still seems to be causing the ceiling off of the deeper part of the wound which is in turn trapping fluid. I'm not extremely pleased with the overall appearance as far as progress from last week to this week therefore I'm gonna discontinue the snap vac at this point. 10/18/18 patient unfortunately this point has not been feeling well for the past several days. She was seen by Grayland Ormond her primary care provider who is a Librarian, academic at [facility]. Subsequently she states that she's been very weak and generally feeling malaise. No fevers, chills, nausea, or vomiting noted at this time. With that being said bloodwork was performed at the PCP office on  the 11th of this month which showed a white blood cell count of 10.7. This was repeated today and shows a white blood cell count of 12.4. This does show signs of worsening. Coupled with the fact that she is feeling  worse and that her left ankle wound is not really showing signs of improvement I feel like this is an indication that the osteomyelitis is likely exacerbating not improving. Overall I think we may also want to check her C-reactive protein and sedimentation rate. Actually did call [facility] office this afternoon while the patient was in the office here with me. Subsequently based on the findings we discussed treatment possibilities and I think that it is appropriate for [place] to go ahead and initiate treatment with doxycycline which I'm going to do. Subsequently he did agree to see about adding a CRP and sedimentation rate to her orders. If that has not already been drawn to where they can run it they will contact the patient she can come back to have that check. They are in agreement with plan as far as the patient and her daughter are concerned. Nonetheless also think we need to get in touch with Dr. Henreitta Leber office to see about getting the patient scheduled with him as soon as possible. 11/08/18 on evaluation today patient presents for follow-up concerning her bilateral foot and ankle ulcers. I did do an extensive review of her chart in epic today. Subsequently she was seen by [name] he did initiate Cefepime IV antibiotic therapy. Subsequently she had some issues with her PICC line this had to be removed because it was coiled and then replaced. Fortunately that was now settled. Unfortunately she has continued have issues with her left heel as well as the issues that she is experiencing with her bilateral lateral malleolus regions. I do believe however both areas seem to be doing a little bit better on evaluation today which is good news. No fevers, chills, nausea, or vomiting noted at  this time. She actually has an angiogram schedule with Dr. dew on this coming Monday, November 11, 2018. Subsequently the patient states that she is feeling much better especially than what she was roughly 2 weeks ago. She actually had to cancel an appointment because she was feeling so poorly. No fevers, chills, nausea, or vomiting noted at this time. 11/15/18 on evaluation today patient actually is status post having had her angiogram with Dr. dew Monday, four days ago. 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 He had to go and work on several areas of the vasculature fortunately he was able to obtain no more than a 30% residual stenosis throughout post procedure. I reviewed this note today. I think this will definitely help with healing at this time. Fortunately there does not appear to be any signs of infection and I do feel like ratio already has a better appearance to it. 11/22/18 upon evaluation today patient actually appears to be doing very well in regard to her wounds in general. The right lateral malleolus looks excellent the heel looks better in the left lateral malleolus also appears to be doing a little better. With that being said the right second toe actually appears to be open and training we been watching this is been dry and stable but now is open. 12/03/2018 Seen today for follow-up and management of multiple bilateral lower extremity wounds. New pressure injury of the great toe which is closed at this time. Wound of the right distal second toe appears larger today with deep undermining and a pocket of fluid present within the undermining region. Left and right malleolus is wounds are stable today with no signs and symptoms of infection.Denies any needs or concerns  during exam today. 12/13/18 on evaluation today patient appears to be doing somewhat better in regard to her left heel ulcer. She also seems to be completely healed in regard to the right  lateral malleolus ulcer. The left malleolus ulcer is smaller what unfortunately the wounds which are new over the first and second toes of the right foot are what are most concerning at this point especially the second. Both areas did require sharp debridement today. 12/20/18 on evaluation today patient's wound actually appears to be doing better in regard to left lateral ankle and her right lateral ankle continues to remain healed. The hill ulcer on the left is improved. She does have improvement noted as well in regard to both toe ulcers. Overall I'm very pleased in this regard. No fevers, chills, nausea, or vomiting noted at this time. [name], [name] ([id number]) 12/23/18 on evaluation today patient is seen after she had her toenails trimmed at the podiatrist office due to issues with her right great toe. There was what appeared to be dark eschar on the surface of the wound which had her in the podiatrist concerned. Nonetheless as I remember that during the last office visit I had utilize silver nitrate of this area I was much less concerned about the situation. Subsequently I was able to clean off much of this tissue without any complication today. This does not appear to show any signs of infection and actually look somewhat better compared to last time post debridement. Her second toe on the right foot actually had callous over and there did appear still be some fluid underneath this that would require debridement today. 12/27/18 on evaluation today patient actually appears to be showing signs of improvement at all locations. Even the left lateral ankle although this is not quite as great as the other sites. Fortunately there does not appear to be any signs of infection at this time and both of her toes on the right foot seem to be showing signs of improvement which is good news and very pleased in this regard. 01/03/19 on evaluation today patient appears to be doing better for the most part in  regard to her wounds in particular. There does not appear to be any evidence of infection at this time which is good news. Fortunately there is no sign of really worsening anywhere except for the right great toe which she does have what appears to be a bruise/deep tissue injury which is very superficial and already resolving. I'm not sure where this came from I questioned her extensively and she does not recall what may have happened with this. Other than that the patient seems to be doing well even the left lateral ankle ulcer looks good and is getting smaller. 01/10/19 on evaluation today patient appears to be doing well in regard to her left heel wound and both of her toe wounds. Overall I feel like there is definitely improvement here and I'm happy in that regard. With that being said unfortunately she is having issues with the left lateral malleolus ulcer which unfortunately still has a lot of depth to it. This is gonna be a very difficult wound for [place] to be able to truly get to heal. I may want to consider some type of skin substitute to see if this would be of benefit for her. I'll discuss this with her more the next visit most likely. This was something I thought about more at the end of the visit when I was Artie out  of the room and the patient had been discharged. 01/17/19 on evaluation today patient appears to be doing very well in regard to her wounds in general. She's been making excellent progress at this time. Fortunately there's no sign of infection at this time either. No fevers, chills, nausea, or vomiting noted at this time. The biggest issue is still her left lateral malleolus where it appears to be doing well and is getting smaller but still shows a small corner where this is deeper and goes down into what appears to be the joint space. Nonetheless this is taking much longer to heal although it still looks better in smaller than previous evaluations. 01/24/19 on evaluation today  patient's wounds actually appear to be doing rather well in general overall. She did require some sharp debridement in regard to the right great toe but everything else appears to be doing excellent no debridement was even necessary. No fevers, chills, nausea, or vomiting noted at this time. 01/31/19 on evaluation today patient actually appears to be doing much better in regard to her left foot wound on the heel as well as the ankle. The right great toe appears to be a little bit worse today this had callous over and trapped a lot of fluid underneath. Fortunately there's no signs of infection at any site which is great news. 02/07/19 on evaluation today patient actually appears to be doing decently well in regard to all of her ulcers at this point. No sharp debridement was required she is a little bit of hyper granulation in regard to the left lateral ankle as well as the left heel but the hill itself is almost completely healed which is excellent news. Overall been very pleased in this regard. 02/14/19 on evaluation today patient actually appears to be doing very well in regard to her ulcers on the right first toe, left lateral malleolus, and left heel. In fact the heel is almost completely healed at this point. The patient does not show any signs of infection which is good news. Overall very pleased with how things have progressed. 04/18/19 Telehealth Evaluation During the COVID-19 National Emergency: Verbal Consent: Obtained from patient Allergies: reviewed and the active list is current. Medication changes: patient has no current medication changes. COVID-19 Screening: 1. Have you traveled internationally or on a cruise ship in the last 14 dayso No 2. Have you had contact with someone with or under investigation for COVID-19o No 3. Have you had a fever, cough, sore throat, or experiencing shortness of breatho No on evaluation today actually did have a visit with this patient through a telehealth  encounter with her home health nurse. Subsequently it was noted that the patient actually appears to be doing okay in regard to her wounds both the right great toe as well as the left lateral malleolus have shown signs of improvement although this in your theme around the left lateral malleolus there eschar coverings for both locations. The question is whether or not they are actually close and whether or not home health needs to discharge the patient or not. Nonetheless my concern is this point obviously is that without actually seeing her and being able to evaluate this directly I cannot ensure that she is completely healed which is the question that I'm being asked. 04/22/19 on evaluation today patient presents for her first evaluation since last time I saw her which was actually February 14, 2019. I did do a telehealth visit last week in which point it was questionable  whether or not she may be healed and had to bring her in today for confirmation. With that being said she does seem to be doing quite well at this point which is good news. There does not appear to be any drainage in the deed I believe her wounds may be healed. Readmission: 09/04/2019 on evaluation today patient appears to be doing unfortunately somewhat more poorly in regard to her left foot ulcer secondary to a wound that began on 08/21/2019 at least when she first noticed this. Fortunately she has not had any evidence of active infection at this time. Systemically. I also do not necessarily see any evidence of infection at the blister/wound site on the first metatarsal head plantar aspect. This almost appears to be something that may have just rubbed inappropriately causing this to breakdown. They did not want a wait too long to come in to be seen as again she had significant issues in the past with wounds that took quite a while to heal in fact it was close to 2 years. Nonetheless this does not appear to be quite that bad but again  we do need to remove some of the necrotic tissue from the surface of the wound to tell exactly the extent. She does not appear to have any significant arterial disease at this point and again her last ABIs and TBI's are recorded above in the alert section her left ABI was 1.27 with a TBI of 0.72 to the right ABI 1.08 with a TBI of 0.39. Other than this the patient has been doing quite DULCY, SIDA ([id number]) well since I last saw her and that was in May 2020. 09/11/2019 on evaluation today patient appeared to be doing very well with regard to her plantar foot ulcer on the left. In fact this appears to be almost completely healed which is awesome. That is after just 1 week of intervention. With that being said there is no signs of active infection at this time. 09/18/2019 on evaluation today patient actually appears to be doing excellent in fact she is completely healed based on what I am seeing at this point. Fortunately there is no signs of active infection at this time and overall patient is very pleased to hear that this area has healed so quickly. Readmission: 05/13/2021 upon evaluation today this patient presents for reevaluation here in the clinic. This is a wound that actually we previously took care of. She had 1 on the right ankle and the left the left turned out to be be harder due to to heal but nonetheless is doing great at this point as the right that has reopened and it was noted first just several weeks ago with a scab over it and came off in just the past few days. Fortunately there does not appear to be any obvious evidence of significant active infection at this time which is great news. No fevers, chills, nausea, vomiting, or diarrhea. 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 There does not appear to be any signs of this interfering in any way with her wound. She does have swelling we previously had compression socks for her  ordered but again it does not look like she wears these on a regular basis by any means. 05/26/2021 upon evaluation today patient appears to be doing well with regard to her wound which is actually showing signs of excellent improvement. There does not appear to be any signs of  active infection which is great news and overall very pleased with where things stand today. No fevers, chills, nausea, vomiting, or diarrhea. 06/02/2021 upon evaluation today patient's wound actually showing signs of excellent improvement. Fortunately there does not appear to be any signs of active infection which is great news. I think the patient is making good progress with regard to her wounds in general. 06/09/2021 upon evaluation today patient appears to be doing excellent in regard to her wounds currently. Fortunately there does not appear to be any signs of active infection which is great news. No fevers, chills, nausea, vomiting, or diarrhea. Overall extremely pleased with where things stand today. I think the patient is making excellent progress. 06/16/2021 upon evaluation today patient appears to be doing well in regard to her wound. This is going require little bit of debridement today and that was discussed with the patient. Otherwise she seems to be doing quite well and I am actually very pleased with where things stand at this point. No fevers, chills, nausea, vomiting, or diarrhea. 06/23/2021 upon evaluation today patient appears to be doing well with regard to her wounds. She has been tolerating the dressing changes without complication. Fortunately there does not appear to be any evidence of infection and she has not had air in her home which she actually lives at an assisted living that got fixed this morning. With that being said because of that her wrap has been extremely hot and bothersome for her over the past week. 06/30/2021 upon evaluation today patient is actually making excellent progress in regard to  her ankle ulcer. She has been tolerating the dressing changes without complication and overall extremely pleased with where things stand there does not appear to be any evidence of active infection which is great news. No fevers, chills, nausea, vomiting, or diarrhea. 07/07/21 upon evaluation today patients and culture on the right actually appears to be doing quite well. There does not appear to be any signs of infection and overall very pleased with where things stand today. No fevers, chills, nausea, or vomiting noted at this time. 07/14/2021 unfortunately the patient today has some evidence of deep tissue injury and pressure getting to the ankle region. Again I am not exactly sure what is going on here but this is very similar to issues that we have had in the past. I explained to the patient that she needs to be very mindful of exactly what is happening I think sleeping in bed is probably the main issue here although there could be other culprits I am not sure what else would potentially lead to this kind of a problem for her. 07/21/2021 upon evaluation today patient's wound actually showing signs of improvement compared to last week. Fortunately there does not appear to be any signs of active infection which is great news and overall very pleased with where things stand in that regard. With that being said I do believe that she is continuing to show signs of overall of getting better although I think this is still basically about what we were 2 weeks ago due to the worsening and now improvement. 07/28/2021 upon evaluation today patient appears to be doing well with regard to her wound. She does have some slough buildup on the surface of the wound which I would have to manage today. Fortunately there is no sign of active infection at this time. No fevers, chills, nausea, vomiting, or diarrhea. 08/04/2021 upon evaluation today patient appears to be doing about the same  in regard to her wound. To be  perfectly honest I am beginning to be a little bit concerned about the overall appearance of the wound bed. I do think possibly taking a sample right around the margin of the wound could be beneficial for her as far as identifying anything such as an inflammatory process or to be honest even a skin tag cancer type process that may be of concern here. Fortunately there does not appear to be any evidence of active infection at this time which is great news she is not having any pain also great news. 08/11/2021 upon evaluation today patient appears to be doing well with regard to her wound. The good news is I did review her biopsy results and it showed some inflammatory mixed findings but nothing that appeared to be malignant which is great news. Overall this is more of a chronic venous stasis type issue which again is more what we have been treating. Nonetheless I just wanted to make sure before going forward that there was not anything more untoward going on at this point. 08/18/2021 upon evaluation today patient appears to be doing well with regard to her ankle ulcer. Fortunately there does not appear to be any signs of active infection at this time which is great overall wound is dramatically improved compared to last week. Since last week I have actually placed her on doxycycline and subsequently this is a good option as far as the findings are concerned at this point. 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 the doxycycline is doing that. There does not appear to be any evidence of active infection systemically which is great news. 08/25/2021 upon evaluation today patient appears to be doing well with regard to her wound. I feel like we are finally get back on track as far as healing is concerned I am much happier with the overall appearance today. I do think that she is  tolerating the dressing changes without complication which is great news. We have been using Hydrofera Blue which I think is a good option. The good news is she is also doing great in Spooner, KELBY LOTSPEICH. ([id number]) regard to her compression sock on the left which is a zipper compression that seems to be doing a great job keeping her edema under good control. 09/01/2021 upon evaluation today patient appears to be doing well with regard to her wound. Infection seems to be under much better control which is great news and very pleased in that regard. Fortunately there does not appear to be any signs of infection currently. 09/08/2021 upon evaluation today patient actually appears to be making good progress in regard to her wound. She has been tolerating the dressing changes without complication. Fortunately there does not appear to be any evidence of active infection at this time which is great news as well. No fevers, chills, nausea, vomiting, or diarrhea. 09/22/2021 upon evaluation today patient appears to be doing well with regard to her wound although is very slow to heal. We have not looked into Apligraf yet I think that is something that we should see about doing. 09/29/2021 upon evaluation today patient's wound is actually showing signs of doing about the same. I am not seeing any evidence of worsening but also no significant evidence of improvement. We did gain approval for the organogenesis products all except for Apligraf  as covered by her insurance. With that being said I do think that we can go ahead and proceed with the NuShield if the patient and her family in agreement of the plan I discussed that with him today she does have a 20% coinsurance which we also discussed. 10/06/2021 upon evaluation today patient appears to unfortunately be doing a little bit worse she appears to be infected based on what I am seeing. Fortunately there does not appear to be any signs of active infection at this  time which is great news. Unfortunately it does appear to be some evidence of around the wound edge indicated by way of erythema and warmth as well as redness 10/13/2021 upon evaluation today patient actually appears to be doing excellent in regard to her ankle ulcer compared to what it was. 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 I think were fine to go ahead and apply the NuShield today. 10/20/2021 upon evaluation today patient appears to be doing excellent in fact the NuShield seems to have done all some for her thus far. I am actually very pleased with where we stand and overall I think that she is making great progress. There is no evidence of active infection at this time. 11/23; patient presents for follow-up. She has no issues or complaints today. She denies signs of infection. She reports stability in her wound healing. 11/03/2021 upon evaluation today patient appears to be doing well with regard to her wounds. She has been tolerating the dressing changes without complication. Fortunately there is no signs of active infection at this time. 11/10/2021 upon evaluation today patient appears to be doing well with regard to her right ankle which is actually showing signs of improvement with the NuShield I am very pleased. Subsequently in regards to the left ankle this appears to be doing okay with no evidence of issue here either. With that being said she has a significant contusion on the left leg from having fallen 2 days ago. She tells me that she was using her walker going to the closet and then when she got to the closet turned around to get something out at which point she fell according to the story. Nonetheless she does have a hematoma just below her knee on the anterior portion of her shin. My  hope is that this will not open until wound although we do need I think Some compression over it also think that we need to have her use an ice as well to help with the swelling and prevent this from getting worse. The last thing she needs is a wound opened up here. 11/17/2021 upon evaluation patient's right ankle actually showing signs of improvement based on what I see currently there is a lot of new skin growth coming in which is great news. Nonetheless I do feel like that the patient is showing improvement as well in regard to her left leg with the use of the Tubigrip it swollen but not as bruised as what I would have expected after what I was seeing last week. Nonetheless I do think doubling up on the Tubigrip would probably be beneficial to try to keep some of the edema under better control here. 11/24/2021 upon evaluation today patient appears to be doing excellent in regard to her wound. This  is actually measuring significantly smaller which is great news. Fortunately I do not see any signs of active infection locally nor systemically at this point. Overall I am very pleased with how the NuShield is doing. 12/30; wound bed looks healthy however not much change in overall wound volume. We applied Nushield again today in the standard fashion 12/08/2021 upon evaluation today patient's wound actually showing signs of good improvement and I am actually very pleased with where we stand today as well. I do not see any evidence of active infection locally nor systemically which is great news. Unfortunately she has been having some issues with stomach upset and diarrhea today. 12/15/2021 upon evaluation today patient appears to be doing excellent in regard to her wound. There is a little bit of dry skin raised up around the edges of the wound but fortunately nothing too significant at this point. Fortunately I do not see any evidence of active infection either which is great news. No fevers, chills,  nausea, vomiting, or diarrhea. 12/29/2021 upon evaluation today patient appears to be doing well with regard to her wound. In general I feel like she is getting very close to complete resolution. I think that she is making good progress here as well. Overall I do not see any signs of infection and very little of this appears to actually be open. 01/12/2022 upon evaluation today patient appears to be doing a little bit worse in regard to her wound. It appears that the collagen actually trapped fluid underneath the collagen which got hard and subsequently caused the fluid to back up. This patient has made the wound appear to be larger than what it was previous. With that being said I do not see any signs of obvious infection is not warm to touch and not significantly erythematous either which is good news. Nonetheless I do think that we will need to continue to keep an eye on this. Probably not go put on antibiotic this week but I will be too far from doing so she is actually on a Z-Pak right now I do not want to really double up on antibiotics. 01/26/2022 upon evaluation today patient's wound is showing signs of improvement although this is slow to turn back around. Fortunately there does not appear to be any evidence of active infection locally or systemically which is great news and overall I am extremely pleased with where we stand today. No fevers, chills, nausea, vomiting, or diarrhea. 02/02/2022 upon evaluation today patient appears to be doing well with regard to her wound. She has been tolerating the dressing changes without complication. Fortunately there does not appear to be any signs of active infection locally nor systemically at this time which is great news. No fevers, chills, nausea, vomiting, or diarrhea. NIESHIA, LARMON ([id number]) 3/9; patient presents for follow-up. She has no issues or complaints today. She denies signs of infection. 02/16/2022 upon evaluation today patient appears  to be doing well with regard to her wound. She has been tolerating the dressing changes without complication. Fortunately I do not see any evidence of active infection locally nor systemically at this point which is great news. Overall I think that the patient is making excellent progress in general. The wound is measuring smaller and though there does appear to be some need for sharp debridement today this is minimal compared to what we have noted previous. 02/23/2022 upon evaluation today patient appears to be doing well with regard to her wound she is definitely showing signs  of improvement which is great news. 03/02/2022 upon evaluation today patient appears to be doing about the same in regard to her wound. Unfortunately were not seeing significant improvement. With that being said is also not significantly worse which is great news. No fevers, chills, nausea, vomiting, or diarrhea. 03-09-2022 upon evaluation today patient appears to be doing well with regard to her wound. She has been tolerating the dressing changes without complication. Fortunately there does not appear to be any signs of active infection locally or systemically which is great news. 03-16-2022 upon evaluation today patient appears to be doing well with regard to her wound this is measuring smaller and looking much better. I am actually very pleased with where we stand and I think that the patient is making great progress. I do not see any evidence of active infection locally or systemically which is great news. No fevers, chills, nausea, vomiting, or diarrhea. 03-23-2022 upon evaluation today patient appears to be doing better in regard to her wounds. In fact the wound area actually is showing signs of excellent improvement and actually very pleased with where we stand today. I do not see any evidence of active infection locally nor systemically which is great news. 4/27; comes in today with again thick callus around the wound  circumference which I removed with a curette. Some debris on the surface. Overall I do not think quite as good as it was last week by description. Using endoform 04-06-2022 upon evaluation today patient appears to be doing pretty well in regard to her wound. Fortunately I do not see any evidence of active infection locally or systemically which is great news and overall I am pleased in that regard. I do feel like this is measuring smaller postdebridement compared to where we were previous. Overall she is headed in the right direction. She does have an area that is threatening to open and looks a little irritated on the left ankle we did measure this just for the discolored area for now its not draining too much but we want to monitor and make sure nothing worsens here. 04-13-2022 upon evaluation today patient appears to be doing better in regard to her wound although this is still very slow to heal. We have reapplied for a skin substitute but have not heard anything back as of yet. Fortunately I do not see any evidence of active infection locally or systemically at this time which is great news. 04-20-2022 upon evaluation today patient's wound is actually showing signs of significant improvement which is great news. We did get approval for skin substitutes which I think is an option here for [place] but at the same time I am not even certain that this is going to be necessary especially considering how well things seem to be doing at this point. If she continues healing as we see her right now but I think were probably going to be able to avoid any need for additional skin substitute. Patient and her daughter are both in agreement with that plan. With that being said she did have a fall she has several areas of contusion fortunately nothing that is can require intervention at this point but nonetheless she did also fracture her rib which is extremely uncomfortable obviously. 04-27-2022 upon evaluation today  patient appears to be doing well currently in regard to her wound on the right lateral ankle. Overall I feel like she is actually doing significantly better even compared to last week and very pleased in this regard.  I think we are on the right track here. 05-04-2022 upon evaluation today patient appears to be doing well with regard to her wound. She is going require some sharp debridement today to clear away some of the necrotic debris. Fortunately I was able to do this quite easily without significant issue or breakdown here. There is a very small area of opening centrally but really this is pretty close to getting close. 05-11-2022 upon evaluation today patient's wound is actually showing signs of excellent improvement. I do not see any evidence of infection locally or systemically at this time. Overall I think she is very close to complete resolution with just a little bit of debridement to clear away some of the dry crusty area around the edges of the wound currently. 05-18-2022 upon evaluation patient's wound bed actually appears to be likely healed although I cannot be 100% sure I am extremely pleased however with where things stand today. I do not see any evidence of infection locally or systemically which is great news and overall I think you are on the right track here. 05-25-2022 upon evaluation today patient appears to be doing well with regard to her leg ulcer. Fortunately there does not appear to be any signs of active infection locally or systemically at this time which is great news. No fevers, chills, nausea, vomiting, or diarrhea. With that being said she does appear to be healed in regard to her right lateral ankle which is great news. In regard to her leg unfortunately during the removal of the compression wrap there was a small skin tear that occurred with the scissors. Subsequently the patient does have a minimal amount of bleeding this is going require a couple Steri-Strips in order  to seal this up. I do think it should likely heal quite well. Obviously this was unintentional. Nonetheless we do have to definitely monitor make sure that this completely heals up as well I am hopeful that we will be the case by next week to be honest. 06-01-2022 upon evaluation today patient appears to be doing excellent in regard to her wounds. Both areas are completely healed which is great news. Fortunately I do not see any signs of active infection locally or systemically at this time which is great and overall I am extremely pleased in that regard. No fevers, chills, nausea, vomiting, or diarrhea. 7/25; patient returns to clinic today with a story that 2 to 3 days ago she was sitting in her chair and she noticed bleeding from her left lower leg lateral aspect. She denies any trauma. She used pressure and a Band-Aid to get it to stop and she arrives in clinic today in follow-up. She was discharged on 6/29 apparently to stockings although she did not have any on and I do not think she was actually wearing them. She has significant chronic venous insufficiency 07-04-2022 upon evaluation today patient presents for follow-up. In the clinic she actually was seen last week when I was on vacation due to an area on her leg which reopened unfortunately. With that being said she does seem to be doing a little bit better but unfortunately this is still Standley, [name] ([id number]) giving her some trouble here. All of her other wounds are still closed which I was previously seen her for and that is great news. 07-13-2022 upon evaluation today patient appears to be doing well with regard to her wound everything appears to be healed unfortunately she has a completely new area on her left  heel which is a blister. Fortunately I do not see any signs of infection but unfortunately I do think this is going to be ongoing issue at this time. Question has been made whether or not we can get this to reabsorb or not.  Again right now I am going to treat this more as a deep tissue injury currently. 07-20-2022 upon evaluation today patient's wound is actually showing signs of being somewhat worse I definitely think she is infected at this point. We will get a need to subsequently have her started on antibiotic as soon as possible. She voiced understanding and her daughter is in agreement with this plan. We were hoping that we would get the blister reabsorb that just did not happen. Objective Constitutional Well-nourished and well-hydrated in no acute distress. Vitals Time Taken: 9:52 AM, Height: 62 in, Weight: 150 lbs, BMI: 27.4, Temperature: 98.1 F, Pulse: 72 bpm, Respiratory Rate: 18 breaths/min, Blood Pressure: 109/65 mmHg. Respiratory normal breathing without difficulty. Psychiatric this patient is able to make decisions and demonstrates good insight into disease process. Alert and Oriented x 3. pleasant and cooperative. General Notes: Upon inspection patient's wound bed actually showed signs of good granulation and epithelization for the most part there was some slough and biofilm noted however there was a tremendous amount of drainage and this definitely appears to be infected based on what we are seeing. My suggestion at this point is good to be that we go ahead and initiate treatment with the doxycycline which has done well for her in the past. I think this is a good option. Integumentary (Hair, Skin) Wound #11 status is Open. Original cause of wound was Pressure Injury. The date acquired was: 07/20/2022. The wound is located on the Left Calcaneus. The wound measures 8cm length x 5cm width x 0.1cm depth; 31.416cm^2 area and 3.142cm^3 volume. There is Fat Layer (Subcutaneous Tissue) exposed. There is no tunneling or undermining noted. There is a medium amount of serosanguineous drainage noted. There is no granulation within the wound bed. There is a large (67-100%) amount of necrotic tissue within the  wound bed including Eschar and Adherent Slough. Wound #12 status is Open. Original cause of wound was Gradually Appeared. The date acquired was: 07/17/2022. The wound is located on the Left,Lateral Ankle. The wound measures 0.5cm length x 0.4cm width x 0.1cm depth; 0.157cm^2 area and 0.016cm^3 volume. There is Fat Layer (Subcutaneous Tissue) exposed. There is no tunneling or undermining noted. There is a medium amount of serosanguineous drainage noted. There is medium (34-66%) red granulation within the wound bed. There is a medium (34-66%) amount of necrotic tissue within the wound bed including Adherent Slough. Assessment Active Problems ICD-10 Non-pressure chronic ulcer of other part of left lower leg with other specified severity Contusion of left lower leg, subsequent encounter Pressure ulcer of left heel, stage 3 Lymphedema, not elsewhere classified Venous insufficiency (chronic) (peripheral) Presence of cardiac pacemaker Long term (current) use of anticoagulants Procedures Scobey, [name] ([id number]) Wound #11 Pre-procedure diagnosis of Wound #11 is a Pressure Ulcer located on the Left Calcaneus . There was a Excisional Skin/Subcutaneous Tissue Debridement with a total area of 40 sq cm performed by [name] Sams., PA-C. With the following instrument(s): Curette to remove Viable and Non-Viable tissue/material. Material removed includes Subcutaneous Tissue and Slough and. A time out was conducted at 10:36, prior to the start of the procedure. A Minimum amount of bleeding was controlled with Pressure. The procedure was tolerated well. Post Debridement Measurements:  8cm length x 5cm width x 0.1cm depth; 3.142cm^3 volume. Post debridement Stage noted as Category/Stage III. Character of Wound/Ulcer Post Debridement is stable. Post procedure Diagnosis Wound #11: Same as Pre-Procedure Plan Follow-up Appointments: Return Appointment in 1 week. Bathing/ Shower/ Hygiene: May shower with  wound dressing protected with water repellent cover or cast protector. No tub bath. Anesthetic (Use 'Patient Medications' Section for Anesthetic Order Entry): Lidocaine applied to wound bed Edema Control - Lymphedema / Segmental Compressive Device / Other: Tubigrip single layer applied. Elevate, Exercise Daily and Avoid Standing for Long Periods of Time. Elevate legs to the level of the heart and pump ankles as often as possible Elevate leg(s) parallel to the floor when sitting. Non-Wound Condition: Cleanse affected area with antibacterial soap and water, Laboratory ordered were: Wound culture routine - wound culture obtained today The following medication(s) was prescribed: doxycycline hyclate oral 100 mg capsule 1 1 capsule oral taken 2 times per day for 14 days starting 07/20/2022 WOUND #11: - Calcaneus Wound Laterality: Left Cleanser: Byram Ancillary Kit - 15 Day Supply (DME) (Generic) 3 x Per Week/30 Days Discharge Instructions: Use supplies as instructed; Kit contains: (15) Saline Bullets; (15) 3x3 Gauze; 15 pr Gloves Primary Dressing: Silvercel 4 1/4x 4 1/4 (in/in) (DME) (Generic) 3 x Per Week/30 Days Discharge Instructions: Apply Silvercel 4 1/4x 4 1/4 (in/in) as instructed Secondary Dressing: Zetuvit Plus 4x4 (in/in) (DME) (Generic) 3 x Per Week/30 Days Secured With: Conform 4'' - Conforming Stretch Gauze Bandage 4x75 (in/in) 3 x Per Week/30 Days Discharge Instructions: Apply as directed Secured With: Tubigrip Size C, 2.75x10 (in/yd) 3 x Per Week/30 Days Discharge Instructions: Apply 3 Tubigrip C 3-finger-widths below knee to base of toes to secure dressing and/or for swelling. WOUND #12: - Ankle Wound Laterality: Left, Lateral Cleanser: Byram Ancillary Kit - 15 Day Supply (DME) (Generic) 3 x Per Week/30 Days Discharge Instructions: Use supplies as instructed; Kit contains: (15) Saline Bullets; (15) 3x3 Gauze; 15 pr Gloves Primary Dressing: Silvercel 4 1/4x 4 1/4 (in/in) (DME)  (Generic) 3 x Per Week/30 Days Discharge Instructions: Apply Silvercel 4 1/4x 4 1/4 (in/in) as instructed Secondary Dressing: Zetuvit Plus 4x4 (in/in) (DME) (Generic) 3 x Per Week/30 Days Secured With: Conform 4'' - Conforming Stretch Gauze Bandage 4x75 (in/in) 3 x Per Week/30 Days Discharge Instructions: Apply as directed Secured With: Tubigrip Size C, 2.75x10 (in/yd) 3 x Per Week/30 Days Discharge Instructions: Apply 3 Tubigrip C 3-finger-widths below knee to base of toes to secure dressing and/or for swelling. 1. I am going to recommend that we go ahead and continue with the wound care measures as before and the patient is in agreement with plan. This includes the use of the doxycycline which I think is a good antibiotic for her we will get a do a culture as well depending on the results of the culture will make any adjustments in care as necessary going forward. 2. I am also can recommend that we have the patient continue with the Zetuvit to cover which does also seem to be doing well. 3. I am also can recommend the Tubigrip size C which I think is doing significantly better. Working to do this in place of the compression wrap. We will see patient back for reevaluation in 1 week here in the clinic. If anything worsens or changes patient will contact our office for additional recommendations. Electronic Signature(s) Signed: 07/20/2022 5:17:41 PM By: [name] Keeler PA-C Previous Signature: 07/20/2022 5:10:33 PM Version By: [name] Keeler PA-C  Entered By: [name] on 07/20/2022 17:17:41 Bart, [name] ([id number]) -------------------------------------------------------------------------------- SuperBill Details Patient Name: [name] Date of Service: 07/20/2022 Medical Record Number: [id number] Patient Account Number: [id number] Date of Birth/Sex: February 02, 1923 (88 y.o. F) Treating RN: [name] Primary Care Provider: Adrian [name] Other Clinician: Massie [name] Referring Provider: Adrian [name] Treating Provider/Extender: [name] in Treatment: 61 Diagnosis Coding ICD-10 Codes Code Description [phone] Non-pressure chronic ulcer of other part of left lower leg with other specified severity S80.12XD Contusion of left lower leg, subsequent encounter L89.623 Pressure ulcer of left heel, stage 3 I89.0 Lymphedema, not elsewhere classified I87.2 Venous insufficiency (chronic) (peripheral) Z95.0 Presence of cardiac pacemaker Z79.01 Long term (current) use of anticoagulants Facility Procedures CPT4 Code: [id number] Description: 11042 - DEB SUBQ TISSUE 20 SQ CM/< Modifier: Quantity: 1 CPT4 Code: Description: ICD-10 Diagnosis Description L89.623 Pressure ulcer of left heel, stage 3 Modifier: Quantity: CPT4 Code: [id number] Description: 74935 - DEB SUBQ TISS EA ADDL 20CM Modifier: Quantity: 1 CPT4 Code: Description: ICD-10 Diagnosis Description L89.623 Pressure ulcer of left heel, stage 3 Modifier: Quantity: Physician Procedures CPT4 Code: 5217471 Description: 11042 - WC PHYS SUBQ TISS 20 SQ CM Modifier: Quantity: 1 CPT4 Code: Description: ICD-10 Diagnosis Description L89.623 Pressure ulcer of left heel, stage 3 Modifier: Quantity: CPT4 Code: 5953967 Description: 11045 - WC PHYS SUBQ TISS EA ADDL 20 CM Modifier: Quantity: 1 CPT4 Code: Description: ICD-10 Diagnosis Description L89.623 Pressure ulcer of left heel, stage 3 Modifier: Quantity: Electronic Signature(s) Signed: 07/20/2022 5:17:54 PM By: [name] Keeler PA-C Entered By: [name] on 07/20/2022 17:17:53

## 2022-07-22 NOTE — Progress Notes (Signed)
KENNIYAH, [name] ([id number]) Visit Report for 07/20/2022 Arrival Information Details Patient Name: [name], [name] Date of Service: 07/20/2022 9:45 AM Medical Record Number: [id number] Patient Account Number: [id number] Date of Birth/Sex: 1923/10/16 (88 y.o. F) Treating RN: Cornell Barman Primary Care Quanda Pavlicek: Adrian Prows Other Clinician: Massie Kluver Referring Yvette Roark: Adrian Prows Treating Detavious Rinn/Extender: Skipper Cliche in Treatment: 19 Visit Information History Since Last Visit All ordered tests and consults were completed: No Patient Arrived: [name] Added or deleted any medications: No Arrival Time: 09:49 Any new allergies or adverse reactions: No Transfer Assistance: None Had a fall or experienced change in Yes Patient Requires Transmission-Based No activities of daily living that may affect Precautions: risk of falls: Patient Has Alerts: Yes Signs or symptoms of abuse/neglect since last visito No Patient Alerts: Patient on Blood Hospitalized since last visit: No Thinner NOT DIABETIC Pain Present Now: Yes ***ELIQUIS*** Electronic Signature(s) Signed: 07/20/2022 3:11:41 PM By: Massie Kluver Entered By: Massie Kluver on 07/20/2022 09:50:06 Fitterer, [name] ([id number]) -------------------------------------------------------------------------------- Clinic Level of Care Assessment Details Patient Name: [name] Date of Service: 07/20/2022 9:45 AM Medical Record Number: [id number] Patient Account Number: [id number] Date of Birth/Sex: 06-10-1923 (88 y.o. F) Treating RN: Cornell Barman Primary Care Xzaria Teo: Adrian Prows Other Clinician: Massie Kluver Referring Kristiana Jacko: Adrian Prows Treating Rayvon Brandvold/Extender: Skipper Cliche in Treatment: 37 Clinic Level of Care Assessment Items TOOL 1 Quantity Score '[]'  - Use when EandM and Procedure is performed on INITIAL visit 0 ASSESSMENTS - Nursing Assessment / Reassessment '[]'  - General  Physical Exam (combine w/ comprehensive assessment (listed just below) when performed on new 0 pt. evals) '[]'  - 0 Comprehensive Assessment (HX, ROS, Risk Assessments, Wounds Hx, etc.) ASSESSMENTS - Wound and Skin Assessment / Reassessment '[]'  - Dermatologic / Skin Assessment (not related to wound area) 0 ASSESSMENTS - Ostomy and/or Continence Assessment and Care '[]'  - Incontinence Assessment and Management 0 '[]'  - 0 Ostomy Care Assessment and Management (repouching, etc.) PROCESS - Coordination of Care '[]'  - Simple Patient / Family Education for ongoing care 0 '[]'  - 0 Complex (extensive) Patient / Family Education for ongoing care '[]'  - 0 Staff obtains Programmer, systems, Records, Test Results / Process Orders '[]'  - 0 Staff telephones HHA, Nursing Homes / Clarify orders / etc '[]'  - 0 Routine Transfer to another Facility (non-emergent condition) '[]'  - 0 Routine Hospital Admission (non-emergent condition) '[]'  - 0 New Admissions / Biomedical engineer / Ordering NPWT, Apligraf, etc. '[]'  - 0 Emergency Hospital Admission (emergent condition) PROCESS - Special Needs '[]'  - Pediatric / Minor Patient Management 0 '[]'  - 0 Isolation Patient Management '[]'  - 0 Hearing / Language / Visual special needs '[]'  - 0 Assessment of Community assistance (transportation, D/C planning, etc.) '[]'  - 0 Additional assistance / Altered mentation '[]'  - 0 Support Surface(s) Assessment (bed, cushion, seat, etc.) INTERVENTIONS - Miscellaneous '[]'  - External ear exam 0 '[]'  - 0 Patient Transfer (multiple staff / Civil Service fast streamer / Similar devices) '[]'  - 0 Simple Staple / Suture removal (25 or less) '[]'  - 0 Complex Staple / Suture removal (26 or more) '[]'  - 0 Hypo/Hyperglycemic Management (do not check if billed separately) '[]'  - 0 Ankle / Brachial Index (ABI) - do not check if billed separately Has the patient been seen at the hospital within the last three years: Yes Total Score: 0 Level Of Care: ____ [name]  ([id number]) Electronic Signature(s) Signed: 07/20/2022 3:11:41 PM By: Massie Kluver Entered By: Massie Kluver on 07/20/2022 10:58:45 Ciolino, [name]. (  [id number]) -------------------------------------------------------------------------------- Encounter Discharge Information Details Patient Name: [name], [name]. Date of Service: 07/20/2022 9:45 AM Medical Record Number: [id number] Patient Account Number: [id number] Date of Birth/Sex: [birth year] (88 y.o. F) Treating RN: Cornell Barman Primary Care Ravin Bendall: Adrian Prows Other Clinician: Massie Kluver Referring Chrystine Frogge: Adrian Prows Treating Atharva Mirsky/Extender: Skipper Cliche in Treatment: 56 Encounter Discharge Information Items Post Procedure Vitals Discharge Condition: Stable Temperature (F): 98.1 Ambulatory Status: Walker Pulse (bpm): 72 Discharge Destination: Home Respiratory Rate (breaths/min): 18 Transportation: Private Auto Blood Pressure (mmHg): 109/65 Accompanied By: daughter Schedule Follow-up Appointment: Yes Clinical Summary of Care: Electronic Signature(s) Signed: 07/20/2022 3:11:41 PM By: Massie Kluver Entered By: Massie Kluver on 07/20/2022 11:14:51 Schreckengost, [name] ([id number]) -------------------------------------------------------------------------------- Lower Extremity Assessment Details Patient Name: [name] Date of Service: 07/20/2022 9:45 AM Medical Record Number: [id number] Patient Account Number: [id number] Date of Birth/Sex: 10/20/1923 (88 y.o. F) Treating RN: Cornell Barman Primary Care Larrisa Cravey: Adrian Prows Other Clinician: Massie Kluver Referring Shameka Aggarwal: Adrian Prows Treating Aniah Pauli/Extender: Skipper Cliche in Treatment: 61 Edema Assessment Assessed: [Left: Yes] [Right: No] Edema: [Left: Ye] [Right: s] Calf Left: Right: Point of Measurement: 33 cm From Medial Instep 26.5 cm Ankle Left: Right: Point of Measurement: 12 cm From Medial Instep 17.5  cm Vascular Assessment Pulses: Dorsalis Pedis Palpable: [Left:Yes] Posterior Tibial Palpable: [Left:Yes] Electronic Signature(s) Signed: 07/20/2022 3:11:41 PM By: Massie Kluver Signed: 07/20/2022 5:20:01 PM By: Gretta Cool, BSN, RN, CWS, Kim RN, BSN Entered By: Massie Kluver on 07/20/2022 10:17:30 Dearmas, [name] ([id number]) -------------------------------------------------------------------------------- Multi Wound Chart Details Patient Name: [name] Date of Service: 07/20/2022 9:45 AM Medical Record Number: [id number] Patient Account Number: [id number] Date of Birth/Sex: 1923/11/21 (88 y.o. F) Treating RN: Cornell Barman Primary Care Jaymee Tilson: Adrian Prows Other Clinician: Massie Kluver Referring Lynden Carrithers: Adrian Prows Treating Henning Ehle/Extender: Skipper Cliche in Treatment: 102 Vital Signs Height(in): 102 Pulse(bpm): 40 Weight(lbs): 150 Blood Pressure(mmHg): 109/65 Body Mass Index(BMI): 27.4 Temperature(F): 98.1 Respiratory Rate(breaths/min): 18 Photos: [N/A:N/A] Wound Location: Left Calcaneus Left, Lateral Ankle N/A Wounding Event: Pressure Injury Gradually Appeared N/A Primary Etiology: Pressure Ulcer Venous Leg Ulcer N/A Comorbid History: Cataracts, Congestive Heart Failure, Cataracts, Congestive Heart Failure, N/A Hypertension, Peripheral Arterial Hypertension, Peripheral Arterial Disease, Osteoarthritis, Neuropathy Disease, Osteoarthritis, Neuropathy Date Acquired: 07/20/2022 07/17/2022 N/A Weeks of Treatment: 0 0 N/A Wound Status: Open Open N/A Wound Recurrence: No No N/A Measurements L x W x D (cm) 8x5x0.1 0.5x0.4x0.1 N/A Area (cm) : 31.416 0.157 N/A Volume (cm) : 3.142 0.016 N/A % Reduction in Area: 0.00% 0.00% N/A % Reduction in Volume: 0.00% 0.00% N/A Classification: Unstageable/Unclassified Full Thickness Without Exposed N/A Support Structures Exudate Amount: Medium Medium N/A Exudate Type: Serosanguineous Serosanguineous N/A Exudate  Color: red, brown red, brown N/A Granulation Amount: None Present (0%) Medium (34-66%) N/A Granulation Quality: N/A Red N/A Necrotic Amount: Large (67-100%) Medium (34-66%) N/A Necrotic Tissue: Eschar, Adherent Stokes N/A Exposed Structures: Fat Layer (Subcutaneous Tissue): Fat Layer (Subcutaneous Tissue): N/A Yes Yes Fascia: No Fascia: No Tendon: No Tendon: No Muscle: No Muscle: No Joint: No Joint: No Bone: No Bone: No Epithelialization: None None N/A Treatment Notes Electronic Signature(s) Signed: 07/20/2022 3:11:41 PM By: Massie Kluver Entered By: Massie Kluver on 07/20/2022 10:17:47 Krock, [name] ([id number]) Joni Fears, [name] ([id number]) -------------------------------------------------------------------------------- [place] Details Patient Name: [name] Date of Service: 07/20/2022 9:45 AM Medical Record Number: [id number] Patient Account Number: [id number] Date of Birth/Sex: 14-May-1923 (88 y.o. F) Treating RN: Cornell Barman Primary Care Kinzey Sheriff: Adrian Prows Other Clinician:  Massie Kluver Referring Kandie Keiper: Adrian Prows Treating Aymen Widrig/Extender: Skipper Cliche in Treatment: 46 Active Inactive Electronic Signature(s) Signed: 07/20/2022 3:11:41 PM By: Massie Kluver Signed: 07/20/2022 5:20:01 PM By: Gretta Cool, BSN, RN, CWS, Kim RN, BSN Entered By: Massie Kluver on 07/20/2022 10:17:38 Teed, [name] ([id number]) -------------------------------------------------------------------------------- Pain Assessment Details Patient Name: [name] Date of Service: 07/20/2022 9:45 AM Medical Record Number: [id number] Patient Account Number: [id number] Date of Birth/Sex: 1923/11/27 (88 y.o. F) Treating RN: Cornell Barman Primary Care Marylin Lathon: Adrian Prows Other Clinician: Massie Kluver Referring Jasmin Winberry: Adrian Prows Treating Madolin Twaddle/Extender: Skipper Cliche in Treatment: 83 Active  Problems Location of Pain Severity and Description of Pain Patient Has Paino Yes Site Locations Pain Location: Generalized Pain With Dressing Change: No Duration of the Pain. Constant / Intermittento Intermittent Rate the pain. Current Pain Level: 8 Character of Pain Describe the Pain: Aching, Other: "its sore" Pain Management and Medication Current Pain Management: Medication: Yes Rest: Yes Notes Patient fell yesterday, She states her left gluteus and low back is sore more when walking now Electronic Signature(s) Signed: 07/20/2022 3:11:41 PM By: Massie Kluver Signed: 07/20/2022 5:20:01 PM By: Gretta Cool, BSN, RN, CWS, Kim RN, BSN Entered By: Massie Kluver on 07/20/2022 09:54:33 Keasling, [name] ([id number]) -------------------------------------------------------------------------------- Patient/Caregiver Education Details Patient Name: [name] Date of Service: 07/20/2022 9:45 AM Medical Record Number: [id number] Patient Account Number: [id number] Date of Birth/Gender: 01/25/1923 (88 y.o. F) Treating RN: Cornell Barman Primary Care Physician: Adrian Prows Other Clinician: Massie Kluver Referring Physician: Adrian Prows Treating Physician/Extender: Skipper Cliche in Treatment: 50 Education Assessment Education Provided To: Patient Education Topics Provided Wound/Skin Impairment: Handouts: Other: continue wound care as directed Electronic Signature(s) Signed: 07/20/2022 3:11:41 PM By: Massie Kluver Entered By: Massie Kluver on 07/20/2022 11:13:53 Felix, [name] ([id number]) -------------------------------------------------------------------------------- Wound Assessment Details Patient Name: [name] Date of Service: 07/20/2022 9:45 AM Medical Record Number: [id number] Patient Account Number: [id number] Date of Birth/Sex: 03-04-1923 (88 y.o. F) Treating RN: Cornell Barman Primary Care Mildred Tuccillo: Adrian Prows Other Clinician: Massie Kluver Referring Sherhonda Gaspar: Adrian Prows Treating Amayiah Gosnell/Extender: Skipper Cliche in Treatment: 61 Wound Status Wound Number: 11 Primary Pressure Ulcer Etiology: Wound Location: Left Calcaneus Wound Open Wounding Event: Pressure Injury Status: Date Acquired: 07/20/2022 Comorbid Cataracts, Congestive Heart Failure, Hypertension, Weeks Of Treatment: 0 History: Peripheral Arterial Disease, Osteoarthritis, Neuropathy Clustered Wound: No Photos Wound Measurements Length: (cm) 8 Width: (cm) 5 Depth: (cm) 0.1 Area: (cm) 31.416 Volume: (cm) 3.142 % Reduction in Area: 0% % Reduction in Volume: 0% Epithelialization: None Tunneling: No Undermining: No Wound Description Classification: Category/Stage III Exudate Amount: Medium Exudate Type: Serosanguineous Exudate Color: red, brown Foul Odor After Cleansing: No Slough/Fibrino Yes Wound Bed Granulation Amount: None Present (0%) Exposed Structure Necrotic Amount: Large (67-100%) Fascia Exposed: No Necrotic Quality: Eschar, Adherent Slough Fat Layer (Subcutaneous Tissue) Exposed: Yes Tendon Exposed: No Muscle Exposed: No Joint Exposed: No Bone Exposed: No Treatment Notes Wound #11 (Calcaneus) Wound Laterality: Left Cleanser Byram Ancillary Kit - 15 Day Supply Discharge Instruction: Use supplies as instructed; Kit contains: (15) Saline Bullets; (15) 3x3 Gauze; 15 pr Gloves Peri-Wound Care Swing, [name] ([id number]) Topical Primary Dressing Silvercel 4 1/4x 4 1/4 (in/in) Discharge Instruction: Apply Silvercel 4 1/4x 4 1/4 (in/in) as instructed Secondary Dressing Zetuvit Plus 4x4 (in/in) Secured With Conform 4'' - Conforming Stretch Gauze Bandage 4x75 (in/in) Discharge Instruction: Apply as directed Tubigrip Size C, 2.75x10 (in/yd) Discharge Instruction: Apply 3 Tubigrip C 3-finger-widths below knee to base of toes to  secure dressing and/or for swelling. Compression Wrap Compression  Stockings Add-Ons Electronic Signature(s) Signed: 07/20/2022 3:11:41 PM By: Massie Kluver Signed: 07/20/2022 5:20:01 PM By: Gretta Cool, BSN, RN, CWS, Kim RN, BSN Entered By: Massie Kluver on 07/20/2022 10:54:11 Arya, [name] ([id number]) -------------------------------------------------------------------------------- Wound Assessment Details Patient Name: [name] Date of Service: 07/20/2022 9:45 AM Medical Record Number: [id number] Patient Account Number: [id number] Date of Birth/Sex: [birth year] (88 y.o. F) Treating RN: Cornell Barman Primary Care Wm Sahagun: Adrian Prows Other Clinician: Massie Kluver Referring Reesha Debes: Adrian Prows Treating Ary Rudnick/Extender: Skipper Cliche in Treatment: 61 Wound Status Wound Number: 12 Primary Venous Leg Ulcer Etiology: Wound Location: Left, Lateral Ankle Wound Open Wounding Event: Gradually Appeared Status: Date Acquired: 07/17/2022 Comorbid Cataracts, Congestive Heart Failure, Hypertension, Weeks Of Treatment: 0 History: Peripheral Arterial Disease, Osteoarthritis, Neuropathy Clustered Wound: No Photos Wound Measurements Length: (cm) 0.5 Width: (cm) 0.4 Depth: (cm) 0.1 Area: (cm) 0.157 Volume: (cm) 0.016 % Reduction in Area: 0% % Reduction in Volume: 0% Epithelialization: None Tunneling: No Undermining: No Wound Description Classification: Full Thickness Without Exposed Support Structures Exudate Amount: Medium Exudate Type: Serosanguineous Exudate Color: red, brown Foul Odor After Cleansing: No Slough/Fibrino Yes Wound Bed Granulation Amount: Medium (34-66%) Exposed Structure Granulation Quality: Red Fascia Exposed: No Necrotic Amount: Medium (34-66%) Fat Layer (Subcutaneous Tissue) Exposed: Yes Necrotic Quality: Adherent Slough Tendon Exposed: No Muscle Exposed: No Joint Exposed: No Bone Exposed: No Treatment Notes Wound #12 (Ankle) Wound Laterality: Left, Lateral Cleanser Byram Ancillary Kit -  15 Day Supply Discharge Instruction: Use supplies as instructed; Kit contains: (15) Saline Bullets; (15) 3x3 Gauze; 15 pr Gloves Peri-Wound Care Labo, [name] ([id number]) Topical Primary Dressing Silvercel 4 1/4x 4 1/4 (in/in) Discharge Instruction: Apply Silvercel 4 1/4x 4 1/4 (in/in) as instructed Secondary Dressing Zetuvit Plus 4x4 (in/in) Secured With Conform 4'' - Conforming Stretch Gauze Bandage 4x75 (in/in) Discharge Instruction: Apply as directed Tubigrip Size C, 2.75x10 (in/yd) Discharge Instruction: Apply 3 Tubigrip C 3-finger-widths below knee to base of toes to secure dressing and/or for swelling. Compression Wrap Compression Stockings Add-Ons Electronic Signature(s) Signed: 07/20/2022 3:11:41 PM By: Massie Kluver Signed: 07/20/2022 5:20:01 PM By: Gretta Cool, BSN, RN, CWS, Kim RN, BSN Entered By: Massie Kluver on 07/20/2022 10:12:46 Bulman, [name] ([id number]) -------------------------------------------------------------------------------- Vitals Details Patient Name: [name] Date of Service: 07/20/2022 9:45 AM Medical Record Number: [id number] Patient Account Number: [id number] Date of Birth/Sex: 1923-03-23 (88 y.o. F) Treating RN: Cornell Barman Primary Care Edith Groleau: Adrian Prows Other Clinician: Massie Kluver Referring Lutie Pickler: Adrian Prows Treating Christinia Lambeth/Extender: Skipper Cliche in Treatment: 51 Vital Signs Time Taken: 09:52 Temperature (F): 98.1 Height (in): 62 Pulse (bpm): 72 Weight (lbs): 150 Respiratory Rate (breaths/min): 18 Body Mass Index (BMI): 27.4 Blood Pressure (mmHg): 109/65 Reference Range: 80 - 120 mg / dl Electronic Signature(s) Signed: 07/20/2022 3:11:41 PM By: Massie Kluver Entered By: Massie Kluver on 07/20/2022 09:54:27

## 2022-07-23 LAB — AEROBIC CULTURE W GRAM STAIN (SUPERFICIAL SPECIMEN): Gram Stain: NONE SEEN

## 2022-07-27 ENCOUNTER — Encounter: Payer: Medicare Other | Admitting: Physician Assistant

## 2022-07-27 DIAGNOSIS — L97828 Non-pressure chronic ulcer of other part of left lower leg with other specified severity: Secondary | ICD-10-CM | POA: Diagnosis not present

## 2022-07-27 NOTE — Progress Notes (Addendum)
AMRAN, MALTER ([id number]) Visit Report for 07/27/2022 Chief Complaint Document Details Patient Name: [name], [name]. Date of Service: 07/27/2022 1:30 PM Medical Record Number: [id number] Patient Account Number: [id number] Date of Birth/Sex: 04/04/1923 (88 y.[name]. F) Treating RN: Carlene Coria Primary Care Provider: Adrian Prows Other Clinician: Massie Kluver Referring Provider: Adrian Prows Treating Provider/Extender: Skipper Cliche in Treatment: 88 Information Obtained from: Patient Chief Complaint Left leg ulcer and left heel blister/deep tissue injury Electronic Signature(s) Signed: 07/27/2022 2:14:46 PM By: Worthy Keeler PA-C Entered By: Worthy Keeler on 07/27/2022 14:14:46 [name], [name] ([id number]) -------------------------------------------------------------------------------- Debridement Details Patient Name: [name] Date of Service: 07/27/2022 1:30 PM Medical Record Number: [id number] Patient Account Number: [id number] Date of Birth/Sex: 12-Jul-1923 (88 y.[name]. F) Treating RN: Carlene Coria Primary Care Provider: Adrian Prows Other Clinician: Massie Kluver Referring Provider: Adrian Prows Treating Provider/Extender: Skipper Cliche in Treatment: 62 Debridement Performed for Wound #12 Left,Lateral Ankle Assessment: Performed By: Physician Tommie Sams., PA-C Debridement Type: Debridement Severity of Tissue Pre Debridement: Fat layer exposed Level of Consciousness (Pre- Awake and Alert procedure): Pre-procedure Verification/Time Out Yes - 14:20 Taken: Start Time: 14:20 Total Area Debrided (L x W): 5.5 (cm) x 2 (cm) = 11 (cm) Tissue and other material Viable, Non-Viable, Eschar, Slough, Subcutaneous, Slough debrided: Level: Skin/Subcutaneous Tissue Debridement Description: Excisional Instrument: Curette Bleeding: Minimum Hemostasis Achieved: Pressure Response to Treatment: Procedure was tolerated well Level of  Consciousness (Post- Awake and Alert procedure): Post Debridement Measurements of Total Wound Length: (cm) 6.2 Width: (cm) 2 Depth: (cm) 0.1 Volume: (cm) 0.974 Character of Wound/Ulcer Post Debridement: Stable Severity of Tissue Post Debridement: Fat layer exposed Post Procedure Diagnosis Same as Pre-procedure Electronic Signature(s) Signed: 07/27/2022 4:19:23 PM By: Carlene Coria RN Signed: 07/27/2022 4:37:38 PM By: Massie Kluver Signed: 07/27/2022 5:20:28 PM By: Worthy Keeler PA-C Entered By: Massie Kluver on 07/27/2022 14:23:39 [name], [name] ([id number]) -------------------------------------------------------------------------------- Debridement Details Patient Name: [name] Date of Service: 07/27/2022 1:30 PM Medical Record Number: [id number] Patient Account Number: [id number] Date of Birth/Sex: 09/24/1923 (88 y.[name]. F) Treating RN: Carlene Coria Primary Care Provider: Adrian Prows Other Clinician: Massie Kluver Referring Provider: Adrian Prows Treating Provider/Extender: Skipper Cliche in Treatment: 62 Debridement Performed for Wound #11 Left Calcaneus Assessment: Performed By: Physician Tommie Sams., PA-C Debridement Type: Debridement Level of Consciousness (Pre- Awake and Alert procedure): Pre-procedure Verification/Time Out Yes - 14:23 Taken: Start Time: 14:23 Total Area Debrided (L x W): 5.5 (cm) x 2.5 (cm) = 13.75 (cm) Tissue and other material Viable, Non-Viable, Slough, Subcutaneous, Slough debrided: Level: Skin/Subcutaneous Tissue Debridement Description: Excisional Instrument: Curette Bleeding: Minimum Hemostasis Achieved: Pressure Response to Treatment: Procedure was tolerated well Level of Consciousness (Post- Awake and Alert procedure): Post Debridement Measurements of Total Wound Length: (cm) 5.5 Stage: Category/Stage III Width: (cm) 2.5 Depth: (cm) 0.1 Volume: (cm) 1.08 Character of Wound/Ulcer Post  Debridement: Stable Post Procedure Diagnosis Same as Pre-procedure Electronic Signature(s) Signed: 07/27/2022 4:19:23 PM By: Carlene Coria RN Signed: 07/27/2022 4:37:38 PM By: Massie Kluver Signed: 07/27/2022 5:20:28 PM By: Worthy Keeler PA-C Entered By: Massie Kluver on 07/27/2022 14:35:11 [name], [name] ([id number]) -------------------------------------------------------------------------------- HPI Details Patient Name: [name] Date of Service: 07/27/2022 1:30 PM Medical Record Number: [id number] Patient Account Number: [id number] Date of Birth/Sex: 12-May-1923 (88 y.[name]. 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 Dr. Amalia Hailey the podiatrist saw her on 07/20/2017 and sent her to the wound center for possible hyperbaric oxygen therapy. past medical history of peripheral vascular disease, varicose veins, status post appendectomy, basal cell carcinoma excision from the left leg, cholecystectomy, pacemaker placement, right lower extremity angiography done by Dr. dew in March 2017 with placement of a stent. there is also note of a successful ablation of the right small saphenous vein done which was reviewed by ultrasound on 10/24/2016. the patient had a right small saphenous vein ablation done on 10/20/2016. The patient has never been a smoker. She has been seen by Dr. Corene Cornea dew the vascular surgeon who most recently saw her on 06/15/2017 for evaluation of ongoing problems with right leg swelling. She had a lower extremity arterial duplex examination done(02/13/17) which showed patent  distal right superficial femoral artery stent and above-the-knee popliteal stent without evidence of restenosis. The ABI was more than 1.3 on the right and more than 1.3 on the left. This was consistent with noncompressible arteries due to medial calcification. The right great toe pressure and PPG waveforms are within normal limits and the left great toe pressure and PPG waveforms are decreased. he recommended she continue to wear her compression stockings and continue with elevation. She is scheduled to have a noninvasive arterial study in the near future 08/16/2017 -- had a lower extremity arterial duplex examination done which showed patent distal right superficial femoral artery stent and above-the-knee popliteal stent without evidence of restenosis. The ABI was more than 1.3 on the right and more than 1.3 on the left. This was consistent with noncompressible arteries due to medial calcification. The right great toe pressure and PPG waveforms are within normal limits and the left great toe pressure and PPG waveforms are decreased. the x-ray of the right ankle has not yet been done 08/24/2017 -- had a right ankle x-ray -- IMPRESSION:1. No fracture, bone lesion or evidence of osteomyelitis. 2. Lateral soft tissue swelling with a soft tissue ulcer. she has not yet seen the vascular surgeon for review 08/31/17 on evaluation today patient's wound appears to be showing signs of improvement. She still with her appointment with vascular in order to review her results of her vascular study and then determine if any intervention would be recommended at that time. No fevers, chills, nausea, or vomiting noted at this time. She has been tolerating the dressing changes without complication. 09/28/17 on evaluation today patient's wound appears to show signs of good improvement in regard to the granulation tissue which is surfacing. There is still a layer of slough covering the wound and the posterior portion  is still significantly deeper than the anterior nonetheless there has been some good sign of things moving towards the better. She is going to go back to Dr. dew for reevaluation to ensure her blood flow is still appropriate. That will be before her next evaluation with [place] next week. No fevers, chills, nausea, or vomiting noted at this time. Patient does have some discomfort rated to be a 3-4/10 depending on activity specifically cleansing the wound makes it worse. 10/05/2017 -- the patient was seen by Dr. Lucky Cowboy last week and noninvasive studies showed a normal right ABI with brisk triphasic waveforms consistent with no arterial insufficiency including normal digital pressures. The duplex showed a patent distal right  SFA stent and the proximal SFA was also normal. He was pleased with her test and thought she should have enough of perfusion for normal wound healing. He would see her back in 6 months time. 12/21/17 on evaluation today patient appears to be doing fairly well in regard to her right lateral ankle wound. Unfortunately the main issue that she is expansion at this point is that she is having some issues with what appears to be some cellulitis in the right anterior shin. She has also been noting a little bit of uncomfortable feeling especially last night and her ankle area. I'm afraid that she made the developing a little bit of an infection. With that being said I think it is in the early stages. 12/28/17 on evaluation today patient's ankle appears to be doing excellent. She's making good progress at this point the cellulitis seems to have improved after last week's evaluation. Overall she is having no significant discomfort which is excellent news. She does have an appointment with Dr. dew on March 29, 2018 for reevaluation in regard to the stent he placed. She seems to have excellent blood flow in the right lower extremity. 01/19/12 on evaluation today patient's wound appears to be doing very  well. In fact she does not appear to require debridement at this point, there's no evidence of infection, and overall from the standpoint of the wound she seems to be doing very well. With that being said I believe that it may be time to switch to different dressing away from the [facility] Dressing she tells me she does have a lot going on her friend actually passed away yesterday and she's also having a lot of issues with her husband this obviously is weighing heavy on her as far as your thoughts and concerns today. 01/25/18 on evaluation today patient appears to be doing fairly well in regard to her right lateral malleolus. She has been tolerating the dressing changes without complication. Overall I feel like this is definitely showing signs of improvement as far as how the overall appearance of the wound is there's also evidence of epithelium start to migrate over the granulation tissue. In general I think that she is progressing nicely as far as the wound is concerned. The only concern she really has is whether or not we can switch to every other week visits in order to avoid having as many appointments as her daughters have a difficult time getting her to her appointments as well as the patient's husband to his he is not doing very well at this point. 02/22/18 on evaluation today patient's right lateral malleolus ulcer appears to be doing great. She has been tolerating the dressing changes without complication. Overall you making excellent progress at this time. Patient is having no significant discomfort. EMONI, WHITWORTH ([id number]) 03/15/18 on evaluation today patient appears to be doing much more poorly in regard to her right lateral ankle ulcer at this point. Unfortunately since have last seen her her husband has passed just a few days ago is obviously weighed heavily on her her daughter also had surgery well she is with her today as usual. There does not appear to be any evidence of  infection she does seem to have significant contusion/deep tissue injury to the right lateral malleolus which was not noted previous when I saw her last. It's hard to tell of exactly when this injury occurred although during the time she was spending the night in the hospital this may have been  most likely. 03/22/18 on evaluation today patient appears to actually be doing very well in regard to her ulcer. She did unfortunately have a setback which was noted last week however the good news is we seem to be getting back on track and in fact the wound in the core did still have some necrotic tissue which will be addressed at this point today but in general I'm seeing signs that things are on the up and up. She is glad to hear this obviously she's been somewhat concerned that due to the how her wound digressed more recently. 03/29/18 on evaluation today patient appears to be doing fairly well in regard to her right lower extremity lateral malleolus ulcer. She unfortunately does have a new area of pressure injury over the inferior portion where the wound has opened up a little bit larger secondary to the pressure she seems to be getting. She does tell me sometimes when she sleeps at night that it actually hurts and does seem to be pushing on the area little bit more unfortunately. There does not appear to be any evidence of infection which is good news. She has been tolerating the dressing changes without complication. She also did have some bruising in the left second and third toes due to the fact that she may have bump this or injured it although she has neuropathy so she does not feel she did move recently that may have been where this came from. Nonetheless there does not appear to be any evidence of infection at this time. 04/12/18 on evaluation today patient's wound on the right lateral ankle actually appears to be doing a little bit better with a lot of necrotic docking tissue centrally loosening up  in clearing away. However she does have the beginnings of a deep tissue injury on the left lateral malleolus likely due to the fact we've been trying offload the right as much as we have. I think she may benefit from an assistive soft device to help with offloading and it looks like they're looking at one of the doughnut conditions that wraps around the lower leg to offload which I think will definitely do a good job. With that being said I think we definitely need to address this issue on the left before it becomes a wound. Patient is not having significant pain. 04/19/18 on evaluation today patient appears to be doing excellent in regard to the progress she's made with her right lateral ankle ulcer. The left ankle region which did show evidence of a deep tissue injury seems to be resolving there's little fluid noted underneath and a blister there's nothing open at this point in time overall I feel like this is progressing nicely which is good news. She does not seem to be having significant discomfort at this point which is also good news. 04/25/18-She is here in follow up evaluation for bilateral lateral malleolar ulcers. The right lateral malleolus ulcer with pale subcutaneous tissue exposure, central area of ulcer with tendon/periosteum exposed. The left lateral malleolus ulcer now with central area of nonviable tissue, otherwise deep tissue injury. She is wearing compression wraps to the left lower extremity, she will place the right lower extremity compression wraps on when she gets home. She will be out of town over the weekend and return next week and follow-up appointment. She completed her doxycycline this morning 05/03/18 on evaluation today patient appears to be doing very well in regard to her right lateral ankle ulcer in general. At least  she's showing some signs of improvement in this regard. Unfortunately she has some additional injury to the left lateral malleolus region which appears  to be new likely even over the past several days. Again this determination is based on the overall appearance. With that being said the patient is obviously frustrated about this currently. 05/10/18-She is here in follow-up evaluation for bilateral lateral malleolar ulcers. She states she has purchased offloading shoes/boots and they will arrive tomorrow. She was asked to bring them in the office at next week's appointment so her provider is aware of product being utilized. She continues to sleep on right or left side, she has been encouraged to sleep on her back. The right lateral malleolus ulcer is precariously close to peri-osteum; will order xray. The left lateral malleolus ulcer is improved. Will switch back to santyl; she will follow up next week. 05/17/18 on evaluation today patient actually appears to be doing very well in regard to her malleolus her ulcers compared to last time I saw them. She does not seem to have as much in the way of contusion at this point which is great news. With that being said she does continue to have discomfort and I do believe that she is still continuing to benefit from the offloading/pressure reducing boots that were recommended. I think this is the key to trying to get this to heal up completely. 05/24/18 on evaluation today patient actually appears to be doing worse at this point in time unfortunately compared to her last week's evaluation. She is having really no increased pain which is good news unfortunately she does have more maceration in your theme and noted surrounding the right lateral ankle the left lateral ankle is not really is erythematous I do not see signs of the overt cellulitis on that side. Unfortunately the wounds do not seem to have shown any signs of improvement since the last evaluation. She also has significant swelling especially on the right compared to previous some of this may be due to infection however also think that she may be served  better while she has these wounds by compression wrapping versus continuing to use the Juxta-Lite for the time being. Especially with the amount of drainage that she is experiencing at this point. No fevers, chills, nausea, or vomiting noted at this time. 05/31/18 on evaluation today patient appears to actually be doing better in regard to her right lateral lower extremity ulcer specifically on the malleolus region. She has been tolerating the antibiotic without complication. With that being said she still continues to have issues but a little bit of redness although nothing like she what she was experiencing previous. She still continues to pressure to her ankle area she did get the problem on offloading boots unfortunately she will not wear them she states there too uncomfortable and she can't get in and out of the bed. Nonetheless at this point her wounds seem to be continually getting worse which is not what we want I'm getting somewhat concerned about her progress and how things are going to proceed if we do not intervene in some way shape or form. I therefore had a very lengthy conversation today about offloading yet again and even made a specific suggestion for switching her to a memory foam mattress and even gave the information for a specific one that they could look at getting if it was something that they were interested in considering. She does not want to be considered for a hospital bed air  mattress although honestly insurance would not cover it that she does not have any wounds on her trunk. 06/14/18 on evaluation today both wounds over the bilateral lateral malleolus her ulcers appear to be doing better there's no evidence of pressure injury at this point. She did get the foam mattress for her bed and this does seem to have been extremely beneficial for her in my pinion. Her daughter states that she is having difficulty getting out of bed because of how soft it is. The patient also  relates this to be. Nonetheless I do feel like she's actually doing better. Unfortunately right after and around the time she was getting the mattress she also sustained a fall when she got up to go pick up the phone and ended up injuring her right elbow she has [address] sut[address] place. We are not caring for this currently although home health is going to be taking the sutures out shortly. Nonetheless this may be something that we need to evaluate going forward. It depends on how well it has or has not healed in the end. She also recently saw an orthopedic specialist for an injection in the right shoulder just before her fall unfortunately the fall seems to have worsened her pain. 06/21/18 on evaluation today patient appears to be doing about the same in regard to her lateral malleolus ulcers. Both appear to be just a little bit deeper but again we are clinging away the necrotic and dead tissue which I think is why this is progressing towards a deeper realm as opposed TERIYAH, PURINGTON. ([id number]) to improving from my measurement standpoint in that regard. Nonetheless she has been tolerating the dressing changes she absolutely hates the memory foam mattress topper that was obtained for her nonetheless I do believe this is still doing excellent as far as taking care of excess pressure in regard to the lateral malleolus regions. She in fact has no pressure injury that I see whereas in weeks past it was week by week I was constantly seeing new pressure injuries. Overall I think it has been very beneficial for her. 07/03/18; patient arrives in my clinic today. She has deep punched out areas over her bilateral lateral malleoli. The area on the right has some more depth. We spent a lot of time today talking about pressure relief for these areas. This started when her daughter asked for a prescription for a memory foam mattress. I have never written a prescription for a mattress and I don't think insurances  would pay for that on an ordinary bed. In any case he came up that she has foam boots that she refuses to wear. I would suggest going to these before any other offloading issues when she is in bed. They say she is meticulous about offloading this the rest of the day 07/10/18- She is seen in follow-up evaluation for bilateral, lateral malleolus ulcers. There is no improvement in the ulcers. She has purchased and is sleeping on a memory foam mattress/overlay, she has been using the offloading boots nightly over the past week. She has a follow up appointment with vascular medicine at the end of October, in my opinion this follow up should be expedited given her deterioration and suboptimal TBI results. We will order plain film xray of the left ankle as deeper structures are palpable; would consider having MRI, regardless of xray report(s). The ulcers will be treated with iodoflex/iodosorb, she is unable to safely change the dressings daily with santyl. 07/19/18 on  evaluation today patient appears to be doing in general visually well in regard to her bilateral lateral malleolus ulcers. She has been tolerating the dressing changes without complication which is good news. With that being said we did have an x-ray performed on 07/12/18 which revealed a slight loosen see in the lateral portion of the distal left fibula which may represent artifact but underline lytic destruction or osteomyelitis could not be excluded. MRI was recommended. With that being said we can see about getting the patient scheduled for an MRI to further evaluate this area. In fact we have that scheduled currently for August 20 19,019. 07/26/18 on evaluation today patient's wound on the right lateral ankle actually appears to be doing fairly well at this point in my pinion. She has made some good progress currently. With that being said unfortunately in regard to the left lateral ankle ulcer this seems to be a little bit more problematic at  this time. In fact as I further evaluated the situation she actually had bone exposed which is the first time that's been the case in the bone appear to be necrotic. Currently I did review patient's note from Dr. Bunnie Domino office with [facility]. He stated that ABI was 1.26 on the right and 0.95 on the left with good waveforms. Her perfusion is stable not reduced from previous studies and her digital waveforms were pretty good particularly on the right. His conclusion upon review of the note was that there was not much she could do to improve her perfusion and he felt she was adequate for wound healing. His suggestion was that she continued to see [place] and consider a synthetic skin graft if there was no underlying infection. He plans to see her back in six months or as needed. 08/01/18 on evaluation today patient appears to be doing better in regard to her right lateral ankle ulcer. Her left lateral ankle ulcer is about the same she still has bone involvement in evidence of necrosis. There does not appear to be evidence of infection at this time On the right lateral lower extremity. I have started her on the Augmentin she picked this up and started this yesterday. This is to get her through until she sees infectious disease which is scheduled for 08/12/18. 08/06/18 on evaluation today patient appears to be doing rather well considering my discussion with patient's daughter at the end of last week. The area which was marked where she had erythema seems to be improved and this is good news. With that being said overall the patient seems to be making good improvement when it comes to the overall appearance of the right lateral ankle ulcer although this has been slow she at least is coming around in this regard. Unfortunately in regard to the left lateral ankle ulcer this is osteomyelitis based on the pathology report as well is bone culture. Nonetheless we are still waiting CT scan.  Unfortunately the MRI we originally ordered cannot be performed as the patient is a pacemaker which I had overlooked. Nonetheless we are working on the CT scan approval and scheduling as of now. She did go to the hospital over the weekend and was placed on IV Cefzo for a couple of days. Fortunately this seems to have improved the erythema quite significantly which is good news. There does not appear to be any evidence of worsening infection at this time. She did have some bleeding after the last debridement therefore I did not perform any  sharp debridement in regard to left lateral ankle at this point. Patient has been approved for a snap vac for the right lateral ankle. 08/14/18; the patient with wounds over her bilateral lateral malleoli. The area on the right actually looks quite good. Been using a snap back on this area. Healthy granulation and appears to be filling in. Unfortunately the area on the left is really problematic. 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 Also noted to have cellulitis. She saw Dr. Novella Olive of infectious disease today and was put on linezolid. We are able to verify this with her pharmacy. She is completed the Augmentin that she was already on. We've been using Iodoflex to this area 08/23/18 on evaluation today patient's wounds both actually appear to be doing better compared to my prior evaluations. Fortunately she showing signs of good improvement in regard to the overall wound status especially where were using the snap vac on the right. In regard to left lateral malleolus the wound bed actually appears to be much cleaner than previously noted. I do not feel any phone directly probed during evaluation today and though there is tendon noted this does not appear to be necrotic it's actually fairly good as far as the overall appearance of the tendon is concerned. In general the wound bed  actually appears to be doing significantly better than it was previous. Patient is currently in the care of [name] and I did review that note today. He actually has her on two weeks of linezolid and then following the patient will be on 1-2 months of Keflex. That is the plan currently. She has been on antibiotics therapy as prescribed by myself initially starting on July 30, 2018 and has been on that continuously up to this point. 08/30/18 on evaluation today patient actually appears to be doing much better in regard to her right lateral malleolus ulcer. She has been tolerating the dressing changes specifically the snap vac without complication although she did have some issues with the seal currently. Apparently there was some trouble with getting it to maintain over the past week past Sunday. Nonetheless overall the wound appears better in regard to the right lateral malleolus region. In regard to left lateral malleolus this actually show some signs of additional granulation although there still tendon noted in the base of the wound this appears to be healthy not necrotic in any way whatsoever. We are considering potentially using a snap vac for the left lateral malleolus as well the product wrap from [employer], [place], was present in the clinic today we're going to see this patient I did have her come in with me after obtaining consent from the patient and her daughter in order to look at the wound and see if there's any recommendation one way or another as to whether or not they felt the snapback could be beneficial for the left lateral malleolus region. But the conclusion was that it might be but that this is definitely a little bit deeper wound than what traditionally would be utilized for a snap vac. 09/06/18 on evaluation today patient actually appears to be doing excellent in my pinion in regard to both ankle ulcers. She has been tolerating the dressing changes without complication which is  great news. Specifically we have been using the snap vac. In regard to the right ankle I'm not even sure that this is going to be necessary for today  and following as the wound has filled in quite nicely. In regard to the left ankle I do believe MEIA, EMLEY ([id number]) that we're seeing excellent epithelialization from the edge as well as granulation in the central portion the tendon is still exposed but there's no evidence of necrotic bone and in general I feel like the patient has made excellent progress even compared to last week with just one week of the snap vac. 09/11/18; this is a patient who has wounds on her bilateral lateral malleoli. Initially both of these were deep stage IV wounds in the setting of chronic arterial insufficiency. She has been revascularized. As I understand think she been using snap vacs to both of these wounds however the area on the right became more superficial and currently she is only using it on the left. Using silver collagen on the right and silver collagen under the back on the left I believe 09/19/18 on evaluation today patient actually appears to be doing very well in regard to her lateral malleolus or ulcers bilaterally. She has been tolerating the dressing changes without complication. Fortunately there does not appear to be any evidence of infection at this time. Overall I feel like she is improving in an excellent manner and I'm very pleased with the fact that everything seems to be turning towards the better for her. This has obviously been a long road. 09/27/18 on evaluation today patient actually appears to be doing very well in regard to her bilateral lateral malleolus ulcers. She has been tolerating the dressing changes without complication. Fortunately there does not appear to be any evidence of infection at this time which is also great news. No fevers, chills, nausea, or vomiting noted at this time. Overall I feel like she is doing excellent  with the snap vac on the left malleolus. She had 40 mL of fluid collection over the past week. 10/04/18 on evaluation today patient actually appears to be doing well in regard to her bilateral lateral malleolus ulcers. She continues to tolerate the dressing changes without complication. One issue that I see is the snap vac on the left lateral malleolus which appears to have sealed off some fluid underlying this area and has not really allowed it to heal to the degree that I would like to see. For that reason I did suggest at this point we may want to pack a small piece of packing strip into this region to allow it to more effectively wick out fluid. 10/11/18 in general the patient today does not feel that she has been doing very well. She's been a little bit lethargic and subsequently is having bodyaches as well according to what she tells me today. With that being said overall she has been concerned with the fact that something may be worsening although to be honest her wounds really have not been appearing poorly. She does have a new ulcer on her left heel unfortunately. This may be pressure related. Nonetheless it seems to me to have potentially started at least as a blister I do not see any evidence of deep tissue injury. In regard to the left ankle the snap vac still seems to be causing the ceiling off of the deeper part of the wound which is in turn trapping fluid. I'm not extremely pleased with the overall appearance as far as progress from last week to this week therefore I'm gonna discontinue the snap vac at this point. 10/18/18 patient unfortunately this point has not been feeling well  for the past several days. She was seen by Grayland Ormond her primary care provider who is a Librarian, academic at [facility]. Subsequently she states that she's been very weak and generally feeling malaise. No fevers, chills, nausea, or vomiting noted at this time. With that being said bloodwork was  performed at the PCP office on the 11th of this month which showed a white blood cell count of 10.7. This was repeated today and shows a white blood cell count of 12.4. This does show signs of worsening. Coupled with the fact that she is feeling worse and that her left ankle wound is not really showing signs of improvement I feel like this is an indication that the osteomyelitis is likely exacerbating not improving. Overall I think we may also want to check her C-reactive protein and sedimentation rate. Actually did call [facility] office this afternoon while the patient was in the office here with me. Subsequently based on the findings we discussed treatment possibilities and I think that it is appropriate for [place] to go ahead and initiate treatment with doxycycline which I'm going to do. Subsequently he did agree to see about adding a CRP and sedimentation rate to her orders. If that has not already been drawn to where they can run it they will contact the patient she can come back to have that check. They are in agreement with plan as far as the patient and her daughter are concerned. Nonetheless also think we need to get in touch with Dr. Henreitta Leber office to see about getting the patient scheduled with him as soon as possible. 11/08/18 on evaluation today patient presents for follow-up concerning her bilateral foot and ankle ulcers. I did do an extensive review of her chart in epic today. Subsequently she was seen by [name] he did initiate Cefepime IV antibiotic therapy. Subsequently she had some issues with her PICC line this had to be removed because it was coiled and then replaced. Fortunately that was now settled. Unfortunately she has continued have issues with her left heel as well as the issues that she is experiencing with her bilateral lateral malleolus regions. I do believe however both areas seem to be doing a little bit better on evaluation today which is good news. No fevers,  chills, nausea, or vomiting noted at this time. She actually has an angiogram schedule with Dr. dew on this coming Monday, November 11, 2018. Subsequently the patient states that she is feeling much better especially than what she was roughly 2 weeks ago. She actually had to cancel an appointment because she was feeling so poorly. No fevers, chills, nausea, or vomiting noted at this time. 11/15/18 on evaluation today patient actually is status post having had her angiogram with Dr. dew Monday, four days ago. 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 He had to go and work on several areas of the vasculature fortunately he was able to obtain no more than a 30% residual stenosis throughout post procedure. I reviewed this note today. I think this will definitely help with healing at this time. Fortunately there does not appear to be any signs of infection and I do feel like ratio already has a better appearance to it. 11/22/18 upon evaluation today patient actually appears to be doing very well in regard to her wounds in general. The right lateral malleolus looks excellent the heel looks better in the left lateral malleolus also appears to be doing a  little better. With that being said the right second toe actually appears to be open and training we been watching this is been dry and stable but now is open. 12/03/2018 Seen today for follow-up and management of multiple bilateral lower extremity wounds. New pressure injury of the great toe which is closed at this time. Wound of the right distal second toe appears larger today with deep undermining and a pocket of fluid present within the undermining region. Left and right malleolus is wounds are stable today with no signs and symptoms of infection.Denies any needs or concerns during exam today. 12/13/18 on evaluation today patient appears to be doing somewhat better in regard to her left heel ulcer. She also seems to be completely  healed in regard to the right lateral malleolus ulcer. The left malleolus ulcer is smaller what unfortunately the wounds which are new over the first and second toes of the right foot are what are most concerning at this point especially the second. Both areas did require sharp debridement today. 12/20/18 on evaluation today patient's wound actually appears to be doing better in regard to left lateral ankle and her right lateral ankle continues to remain healed. The hill ulcer on the left is improved. She does have improvement noted as well in regard to both toe ulcers. Overall I'm very pleased in this regard. No fevers, chills, nausea, or vomiting noted at this time. 12/23/18 on evaluation today patient is seen after she had her toenails trimmed at the podiatrist office due to issues with her right great toe. There was what appeared to be dark eschar on the surface of the wound which had her in the podiatrist concerned. Nonetheless as I remember that during the last office visit I had utilize silver nitrate of this area I was much less concerned about the situation. Subsequently I was able to clean off much of this tissue without any complication today. This does not appear to show any signs of infection and actually look somewhat better Barcellos, [name] ([id number]) compared to last time post debridement. Her second toe on the right foot actually had callous over and there did appear still be some fluid underneath this that would require debridement today. 12/27/18 on evaluation today patient actually appears to be showing signs of improvement at all locations. Even the left lateral ankle although this is not quite as great as the other sites. Fortunately there does not appear to be any signs of infection at this time and both of her toes on the right foot seem to be showing signs of improvement which is good news and very pleased in this regard. 01/03/19 on evaluation today patient appears to be  doing better for the most part in regard to her wounds in particular. There does not appear to be any evidence of infection at this time which is good news. Fortunately there is no sign of really worsening anywhere except for the right great toe which she does have what appears to be a bruise/deep tissue injury which is very superficial and already resolving. I'm not sure where this came from I questioned her extensively and she does not recall what may have happened with this. Other than that the patient seems to be doing well even the left lateral ankle ulcer looks good and is getting smaller. 01/10/19 on evaluation today patient appears to be doing well in regard to her left heel wound and both of her toe wounds. Overall I feel like there is definitely improvement  here and I'm happy in that regard. With that being said unfortunately she is having issues with the left lateral malleolus ulcer which unfortunately still has a lot of depth to it. This is gonna be a very difficult wound for [place] to be able to truly get to heal. I may want to consider some type of skin substitute to see if this would be of benefit for her. I'll discuss this with her more the next visit most likely. This was something I thought about more at the end of the visit when I was Artie out of the room and the patient had been discharged. 01/17/19 on evaluation today patient appears to be doing very well in regard to her wounds in general. She's been making excellent progress at this time. Fortunately there's no sign of infection at this time either. No fevers, chills, nausea, or vomiting noted at this time. The biggest issue is still her left lateral malleolus where it appears to be doing well and is getting smaller but still shows a small corner where this is deeper and goes down into what appears to be the joint space. Nonetheless this is taking much longer to heal although it still looks better in smaller than  previous evaluations. 01/24/19 on evaluation today patient's wounds actually appear to be doing rather well in general overall. She did require some sharp debridement in regard to the right great toe but everything else appears to be doing excellent no debridement was even necessary. No fevers, chills, nausea, or vomiting noted at this time. 01/31/19 on evaluation today patient actually appears to be doing much better in regard to her left foot wound on the heel as well as the ankle. The right great toe appears to be a little bit worse today this had callous over and trapped a lot of fluid underneath. Fortunately there's no signs of infection at any site which is great news. 02/07/19 on evaluation today patient actually appears to be doing decently well in regard to all of her ulcers at this point. No sharp debridement was required she is a little bit of hyper granulation in regard to the left lateral ankle as well as the left heel but the hill itself is almost completely healed which is excellent news. Overall been very pleased in this regard. 02/14/19 on evaluation today patient actually appears to be doing very well in regard to her ulcers on the right first toe, left lateral malleolus, and left heel. In fact the heel is almost completely healed at this point. The patient does not show any signs of infection which is good news. Overall very pleased with how things have progressed. 04/18/19 Telehealth Evaluation During the COVID-19 National Emergency: Verbal Consent: Obtained from patient Allergies: reviewed and the active list is current. Medication changes: patient has no current medication changes. COVID-19 Screening: 1. Have you traveled internationally or on a cruise ship in the last 14 dayso No 2. Have you had contact with someone with or under investigation for COVID-19o No 3. Have you had a fever, cough, sore throat, or experiencing shortness of breatho No on evaluation today actually did  have a visit with this patient through a telehealth encounter with her home health nurse. Subsequently it was noted that the patient actually appears to be doing okay in regard to her wounds both the right great toe as well as the left lateral malleolus have shown signs of improvement although this in your theme around the left lateral malleolus there eschar  coverings for both locations. The question is whether or not they are actually close and whether or not home health needs to discharge the patient or not. Nonetheless my concern is this point obviously is that without actually seeing her and being able to evaluate this directly I cannot ensure that she is completely healed which is the question that I'm being asked. 04/22/19 on evaluation today patient presents for her first evaluation since last time I saw her which was actually February 14, 2019. I did do a telehealth visit last week in which point it was questionable whether or not she may be healed and had to bring her in today for confirmation. With that being said she does seem to be doing quite well at this point which is good news. There does not appear to be any drainage in the deed I believe her wounds may be healed. Readmission: 09/04/2019 on evaluation today patient appears to be doing unfortunately somewhat more poorly in regard to her left foot ulcer secondary to a wound that began on 08/21/2019 at least when she first noticed this. Fortunately she has not had any evidence of active infection at this time. Systemically. I also do not necessarily see any evidence of infection at the blister/wound site on the first metatarsal head plantar aspect. This almost appears to be something that may have just rubbed inappropriately causing this to breakdown. They did not want a wait too long to come in to be seen as again she had significant issues in the past with wounds that took quite a while to heal in fact it was close to 2 years.  Nonetheless this does not appear to be quite that bad but again we do need to remove some of the necrotic tissue from the surface of the wound to tell exactly the extent. She does not appear to have any significant arterial disease at this point and again her last ABIs and TBI's are recorded above in the alert section her left ABI was 1.27 with a TBI of 0.72 to the right ABI 1.08 with a TBI of 0.39. Other than this the patient has been doing quite well since I last saw her and that was in May 2020. 09/11/2019 on evaluation today patient appeared to be doing very well with regard to her plantar foot ulcer on the left. In fact this appears to be almost completely healed which is awesome. That is after just 1 week of intervention. With that being said there is no signs of active infection at this time. LEODA, SMITHHART ([id number]) 09/18/2019 on evaluation today patient actually appears to be doing excellent in fact she is completely healed based on what I am seeing at this point. Fortunately there is no signs of active infection at this time and overall patient is very pleased to hear that this area has healed so quickly. Readmission: 05/13/2021 upon evaluation today this patient presents for reevaluation here in the clinic. This is a wound that actually we previously took care of. She had 1 on the right ankle and the left the left turned out to be be harder due to to heal but nonetheless is doing great at this point as the right that has reopened and it was noted first just several weeks ago with a scab over it and came off in just the past few days. Fortunately there does not appear to be any obvious evidence of significant active infection at this time which is great news. No fevers, chills,  nausea, vomiting, or diarrhea. 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 There does not appear to be any signs of this interfering in any way with her wound.  She does have swelling we previously had compression socks for her ordered but again it does not look like she wears these on a regular basis by any means. 05/26/2021 upon evaluation today patient appears to be doing well with regard to her wound which is actually showing signs of excellent improvement. There does not appear to be any signs of active infection which is great news and overall very pleased with where things stand today. No fevers, chills, nausea, vomiting, or diarrhea. 06/02/2021 upon evaluation today patient's wound actually showing signs of excellent improvement. Fortunately there does not appear to be any signs of active infection which is great news. I think the patient is making good progress with regard to her wounds in general. 06/09/2021 upon evaluation today patient appears to be doing excellent in regard to her wounds currently. Fortunately there does not appear to be any signs of active infection which is great news. No fevers, chills, nausea, vomiting, or diarrhea. Overall extremely pleased with where things stand today. I think the patient is making excellent progress. 06/16/2021 upon evaluation today patient appears to be doing well in regard to her wound. This is going require little bit of debridement today and that was discussed with the patient. Otherwise she seems to be doing quite well and I am actually very pleased with where things stand at this point. No fevers, chills, nausea, vomiting, or diarrhea. 06/23/2021 upon evaluation today patient appears to be doing well with regard to her wounds. She has been tolerating the dressing changes without complication. Fortunately there does not appear to be any evidence of infection and she has not had air in her home which she actually lives at an assisted living that got fixed this morning. With that being said because of that her wrap has been extremely hot and bothersome for her over the past week. 06/30/2021 upon evaluation  today patient is actually making excellent progress in regard to her ankle ulcer. She has been tolerating the dressing changes without complication and overall extremely pleased with where things stand there does not appear to be any evidence of active infection which is great news. No fevers, chills, nausea, vomiting, or diarrhea. 07/07/21 upon evaluation today patients and culture on the right actually appears to be doing quite well. There does not appear to be any signs of infection and overall very pleased with where things stand today. No fevers, chills, nausea, or vomiting noted at this time. 07/14/2021 unfortunately the patient today has some evidence of deep tissue injury and pressure getting to the ankle region. Again I am not exactly sure what is going on here but this is very similar to issues that we have had in the past. I explained to the patient that she needs to be very mindful of exactly what is happening I think sleeping in bed is probably the main issue here although there could be other culprits I am not sure what else would potentially lead to this kind of a problem for her. 07/21/2021 upon evaluation today patient's wound actually showing signs of improvement compared to last week. Fortunately there does not appear to be any signs of active infection which is great news and overall very pleased with where things stand in that regard. With that being said I  do believe that she is continuing to show signs of overall of getting better although I think this is still basically about what we were 2 weeks ago due to the worsening and now improvement. 07/28/2021 upon evaluation today patient appears to be doing well with regard to her wound. She does have some slough buildup on the surface of the wound which I would have to manage today. Fortunately there is no sign of active infection at this time. No fevers, chills, nausea, vomiting, or diarrhea. 08/04/2021 upon evaluation today patient  appears to be doing about the same in regard to her wound. To be perfectly honest I am beginning to be a little bit concerned about the overall appearance of the wound bed. I do think possibly taking a sample right around the margin of the wound could be beneficial for her as far as identifying anything such as an inflammatory process or to be honest even a skin tag cancer type process that may be of concern here. Fortunately there does not appear to be any evidence of active infection at this time which is great news she is not having any pain also great news. 08/11/2021 upon evaluation today patient appears to be doing well with regard to her wound. The good news is I did review her biopsy results and it showed some inflammatory mixed findings but nothing that appeared to be malignant which is great news. Overall this is more of a chronic venous stasis type issue which again is more what we have been treating. Nonetheless I just wanted to make sure before going forward that there was not anything more untoward going on at this point. 08/18/2021 upon evaluation today patient appears to be doing well with regard to her ankle ulcer. Fortunately there does not appear to be any signs of active infection at this time which is great overall wound is dramatically improved compared to last week. Since last week I have actually placed her on doxycycline and subsequently this is a good option as far as the findings are concerned at this point. 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 the doxycycline is doing that. There does not appear to be any evidence of active infection systemically which is great news. 08/25/2021 upon evaluation today patient appears to be doing well with regard to her wound. I feel like we are finally get back on track as far as healing is concerned I am much  happier with the overall appearance today. I do think that she is tolerating the dressing changes without complication which is great news. We have been using Hydrofera Blue which I think is a good option. The good news is she is also doing great in regard to her compression sock on the left which is a zipper compression that seems to be doing a great job keeping her edema under good control. 09/01/2021 upon evaluation today patient appears to be doing well with regard to her wound. Infection seems to be under much better control which is great news and very pleased in that regard. Fortunately there does not appear to be any signs of infection currently. SAMIE, BARCLIFT ([id number]) 09/08/2021 upon evaluation today patient actually appears to be making good progress in regard to her wound. She has been tolerating the dressing changes without complication. Fortunately there does not appear to be any evidence of  active infection at this time which is great news as well. No fevers, chills, nausea, vomiting, or diarrhea. 09/22/2021 upon evaluation today patient appears to be doing well with regard to her wound although is very slow to heal. We have not looked into Apligraf yet I think that is something that we should see about doing. 09/29/2021 upon evaluation today patient's wound is actually showing signs of doing about the same. I am not seeing any evidence of worsening but also no significant evidence of improvement. We did gain approval for the organogenesis products all except for Apligraf as covered by her insurance. With that being said I do think that we can go ahead and proceed with the NuShield if the patient and her family in agreement of the plan I discussed that with him today she does have a 20% coinsurance which we also discussed. 10/06/2021 upon evaluation today patient appears to unfortunately be doing a little bit worse she appears to be infected based on what I am seeing. Fortunately  there does not appear to be any signs of active infection at this time which is great news. Unfortunately it does appear to be some evidence of around the wound edge indicated by way of erythema and warmth as well as redness 10/13/2021 upon evaluation today patient actually appears to be doing excellent in regard to her ankle ulcer compared to what it was. 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 I think were fine to go ahead and apply the NuShield today. 10/20/2021 upon evaluation today patient appears to be doing excellent in fact the NuShield seems to have done all some for her thus far. I am actually very pleased with where we stand and overall I think that she is making great progress. There is no evidence of active infection at this time. 11/23; patient presents for follow-up. She has no issues or complaints today. She denies signs of infection. She reports stability in her wound healing. 11/03/2021 upon evaluation today patient appears to be doing well with regard to her wounds. She has been tolerating the dressing changes without complication. Fortunately there is no signs of active infection at this time. 11/10/2021 upon evaluation today patient appears to be doing well with regard to her right ankle which is actually showing signs of improvement with the NuShield I am very pleased. Subsequently in regards to the left ankle this appears to be doing okay with no evidence of issue here either. With that being said she has a significant contusion on the left leg from having fallen 2 days ago. She tells me that she was using her walker going to the closet and then when she got to the closet turned around to get something out at which point she fell according to the story. Nonetheless she does have a  hematoma just below her knee on the anterior portion of her shin. My hope is that this will not open until wound although we do need I think Some compression over it also think that we need to have her use an ice as well to help with the swelling and prevent this from getting worse. The last thing she needs is a wound opened up here. 11/17/2021 upon evaluation patient's right ankle actually showing signs of improvement based on what I see currently  there is a lot of new skin growth coming in which is great news. Nonetheless I do feel like that the patient is showing improvement as well in regard to her left leg with the use of the Tubigrip it swollen but not as bruised as what I would have expected after what I was seeing last week. Nonetheless I do think doubling up on the Tubigrip would probably be beneficial to try to keep some of the edema under better control here. 11/24/2021 upon evaluation today patient appears to be doing excellent in regard to her wound. This is actually measuring significantly smaller which is great news. Fortunately I do not see any signs of active infection locally nor systemically at this point. Overall I am very pleased with how the NuShield is doing. 12/30; wound bed looks healthy however not much change in overall wound volume. We applied Nushield again today in the standard fashion 12/08/2021 upon evaluation today patient's wound actually showing signs of good improvement and I am actually very pleased with where we stand today as well. I do not see any evidence of active infection locally nor systemically which is great news. Unfortunately she has been having some issues with stomach upset and diarrhea today. 12/15/2021 upon evaluation today patient appears to be doing excellent in regard to her wound. There is a little bit of dry skin raised up around the edges of the wound but fortunately nothing too significant at this point. Fortunately I do not see any evidence of  active infection either which is great news. No fevers, chills, nausea, vomiting, or diarrhea. 12/29/2021 upon evaluation today patient appears to be doing well with regard to her wound. In general I feel like she is getting very close to complete resolution. I think that she is making good progress here as well. Overall I do not see any signs of infection and very little of this appears to actually be open. 01/12/2022 upon evaluation today patient appears to be doing a little bit worse in regard to her wound. It appears that the collagen actually trapped fluid underneath the collagen which got hard and subsequently caused the fluid to back up. This patient has made the wound appear to be larger than what it was previous. With that being said I do not see any signs of obvious infection is not warm to touch and not significantly erythematous either which is good news. Nonetheless I do think that we will need to continue to keep an eye on this. Probably not go put on antibiotic this week but I will be too far from doing so she is actually on a Z-Pak right now I do not want to really double up on antibiotics. 01/26/2022 upon evaluation today patient's wound is showing signs of improvement although this is slow to turn back around. Fortunately there does not appear to be any evidence of active infection locally or systemically which is great news and overall I am extremely pleased with where we stand today. No fevers, chills, nausea, vomiting, or diarrhea. 02/02/2022 upon evaluation today patient appears to be doing well with regard to her wound. She has been tolerating the dressing changes without complication. Fortunately there does not appear to be any signs of active infection locally nor systemically at this time which is great news. No fevers, chills, nausea, vomiting, or diarrhea. 3/9; patient presents for follow-up. She has no issues or complaints today. She denies signs of infection. 02/16/2022 upon  evaluation today patient appears to be doing  well with regard to her wound. She has been tolerating the dressing changes without complication. Fortunately I do not see any evidence of active infection locally nor systemically at this point which is great news. Overall I ABBYE, LAO ([id number]) think that the patient is making excellent progress in general. The wound is measuring smaller and though there does appear to be some need for sharp debridement today this is minimal compared to what we have noted previous. 02/23/2022 upon evaluation today patient appears to be doing well with regard to her wound she is definitely showing signs of improvement which is great news. 03/02/2022 upon evaluation today patient appears to be doing about the same in regard to her wound. Unfortunately were not seeing significant improvement. With that being said is also not significantly worse which is great news. No fevers, chills, nausea, vomiting, or diarrhea. 03-09-2022 upon evaluation today patient appears to be doing well with regard to her wound. She has been tolerating the dressing changes without complication. Fortunately there does not appear to be any signs of active infection locally or systemically which is great news. 03-16-2022 upon evaluation today patient appears to be doing well with regard to her wound this is measuring smaller and looking much better. I am actually very pleased with where we stand and I think that the patient is making great progress. I do not see any evidence of active infection locally or systemically which is great news. No fevers, chills, nausea, vomiting, or diarrhea. 03-23-2022 upon evaluation today patient appears to be doing better in regard to her wounds. In fact the wound area actually is showing signs of excellent improvement and actually very pleased with where we stand today. I do not see any evidence of active infection locally nor systemically which is great  news. 4/27; comes in today with again thick callus around the wound circumference which I removed with a curette. Some debris on the surface. Overall I do not think quite as good as it was last week by description. Using endoform 04-06-2022 upon evaluation today patient appears to be doing pretty well in regard to her wound. Fortunately I do not see any evidence of active infection locally or systemically which is great news and overall I am pleased in that regard. I do feel like this is measuring smaller postdebridement compared to where we were previous. Overall she is headed in the right direction. She does have an area that is threatening to open and looks a little irritated on the left ankle we did measure this just for the discolored area for now its not draining too much but we want to monitor and make sure nothing worsens here. 04-13-2022 upon evaluation today patient appears to be doing better in regard to her wound although this is still very slow to heal. We have reapplied for a skin substitute but have not heard anything back as of yet. Fortunately I do not see any evidence of active infection locally or systemically at this time which is great news. 04-20-2022 upon evaluation today patient's wound is actually showing signs of significant improvement which is great news. We did get approval for skin substitutes which I think is an option here for [place] but at the same time I am not even certain that this is going to be necessary especially considering how well things seem to be doing at this point. If she continues healing as we see her right now but I think were probably going to be able to  avoid any need for additional skin substitute. Patient and her daughter are both in agreement with that plan. With that being said she did have a fall she has several areas of contusion fortunately nothing that is can require intervention at this point but nonetheless she did also fracture her rib which is  extremely uncomfortable obviously. 04-27-2022 upon evaluation today patient appears to be doing well currently in regard to her wound on the right lateral ankle. Overall I feel like she is actually doing significantly better even compared to last week and very pleased in this regard. I think we are on the right track here. 05-04-2022 upon evaluation today patient appears to be doing well with regard to her wound. She is going require some sharp debridement today to clear away some of the necrotic debris. Fortunately I was able to do this quite easily without significant issue or breakdown here. There is a very small area of opening centrally but really this is pretty close to getting close. 05-11-2022 upon evaluation today patient's wound is actually showing signs of excellent improvement. I do not see any evidence of infection locally or systemically at this time. Overall I think she is very close to complete resolution with just a little bit of debridement to clear away some of the dry crusty area around the edges of the wound currently. 05-18-2022 upon evaluation patient's wound bed actually appears to be likely healed although I cannot be 100% sure I am extremely pleased however with where things stand today. I do not see any evidence of infection locally or systemically which is great news and overall I think you are on the right track here. 05-25-2022 upon evaluation today patient appears to be doing well with regard to her leg ulcer. Fortunately there does not appear to be any signs of active infection locally or systemically at this time which is great news. No fevers, chills, nausea, vomiting, or diarrhea. With that being said she does appear to be healed in regard to her right lateral ankle which is great news. In regard to her leg unfortunately during the removal of the compression wrap there was a small skin tear that occurred with the scissors. Subsequently the patient does have a minimal amount  of bleeding this is going require a couple Steri-Strips in order to seal this up. I do think it should likely heal quite well. Obviously this was unintentional. Nonetheless we do have to definitely monitor make sure that this completely heals up as well I am hopeful that we will be the case by next week to be honest. 06-01-2022 upon evaluation today patient appears to be doing excellent in regard to her wounds. Both areas are completely healed which is great news. Fortunately I do not see any signs of active infection locally or systemically at this time which is great and overall I am extremely pleased in that regard. No fevers, chills, nausea, vomiting, or diarrhea. 7/25; patient returns to clinic today with a story that 2 to 3 days ago she was sitting in her chair and she noticed bleeding from her left lower leg lateral aspect. She denies any trauma. She used pressure and a Band-Aid to get it to stop and she arrives in clinic today in follow-up. She was discharged on 6/29 apparently to stockings although she did not have any on and I do not think she was actually wearing them. She has significant chronic venous insufficiency 07-04-2022 upon evaluation today patient presents for follow-up. In the clinic  she actually was seen last week when I was on vacation due to an area on her leg which reopened unfortunately. With that being said she does seem to be doing a little bit better but unfortunately this is still giving her some trouble here. All of her other wounds are still closed which I was previously seen her for and that is great news. 07-13-2022 upon evaluation today patient appears to be doing well with regard to her wound everything appears to be healed unfortunately she has a completely new area on her left heel which is a blister. Fortunately I do not see any signs of infection but unfortunately I do think this is going to be ongoing issue at this time. Question has been made whether or not we  can get this to reabsorb or not. Again right now I am going to CHANTA, BAUERS. ([id number]) treat this more as a deep tissue injury currently. 07-20-2022 upon evaluation today patient's wound is actually showing signs of being somewhat worse I definitely think she is infected at this point. We will get a need to subsequently have her started on antibiotic as soon as possible. She voiced understanding and her daughter is in agreement with this plan. We were hoping that we would get the blister reabsorb that just did not happen. 07-27-2022 upon evaluation today patient's ankle ulcer unfortunately is not doing nearly as well as well would like to see. Fortunately there does not appear to be any signs of infection locally or systemically at this time which is great news and overall I am extremely pleased. With that being said unfortunately the patient continues to have issues with her left lateral ankle which is now open and subsequently the heel which again is of concern here. Electronic Signature(s) Signed: 07/27/2022 3:02:02 PM By: Worthy Keeler PA-C Entered By: Worthy Keeler on 07/27/2022 15:02:02 Lopez, [name] ([id number]) -------------------------------------------------------------------------------- Physical Exam Details Patient Name: [name] Date of Service: 07/27/2022 1:30 PM Medical Record Number: [id number] Patient Account Number: [id number] Date of Birth/Sex: Apr 26, 1923 (88 y.[name]. F) Treating RN: Carlene Coria Primary Care Provider: Adrian Prows Other Clinician: Massie Kluver Referring Provider: Adrian Prows Treating Provider/Extender: Skipper Cliche in Treatment: 1 Constitutional Well-nourished and well-hydrated in no acute distress. Respiratory normal breathing without difficulty. Psychiatric this patient is able to make decisions and demonstrates good insight into disease process. Alert and Oriented x 3. pleasant and  cooperative. Notes Patient's wounds did require sharp debridement clearway necrotic debris she tolerated debridement today without complication and postdebridement wound bed appears to be doing much better which is great news. Electronic Signature(s) Signed: 07/27/2022 3:02:15 PM By: Worthy Keeler PA-C Entered By: Worthy Keeler on 07/27/2022 15:02:14 Lebo, [name] -------------------------------------------------------------------------------- Physician Orders Details Patient Name: [name] Date of Service: 07/27/2022 1:30 PM Medical Record Number: [id number] Patient Account Number: [id number] Date of Birth/Sex: 1923/10/29 (88 y.[name]. F) Treating RN: Carlene Coria Primary Care Provider: Adrian Prows Other Clinician: Massie Kluver Referring Provider: Adrian Prows Treating Provider/Extender: Skipper Cliche in Treatment: 22 Verbal / Phone Orders: No Diagnosis Coding ICD-10 Coding Code Description [phone] Non-pressure chronic ulcer of other part of left lower leg with other specified severity S80.12XD Contusion of left lower leg, subsequent encounter L89.623 Pressure ulcer of left heel, stage 3 I89.0 Lymphedema, not elsewhere classified I87.2 Venous insufficiency (chronic) (peripheral) Z95.0 Presence of cardiac pacemaker Z79.01 Long term (current) use of anticoagulants Follow-up Appointments [name] Return Appointment in 1 week. Bathing/ Shower/  Hygiene [name] May shower with wound dressing protected with water repellent cover or cast protector. [name] No tub bath. Anesthetic (Use 'Patient Medications' Section for Anesthetic Order Entry) [name] Lidocaine applied to wound bed Edema Control - Lymphedema / Segmental Compressive Device / Other [name] Tubigrip single layer applied. - Tubi D single layer left lower leg [name] Elevate, Exercise Daily and Avoid Standing for Long Periods of Time. [name] Elevate legs to the level of the heart and pump ankles as often as  possible [name] Elevate leg(s) parallel to the floor when sitting. Non-Wound Condition Left Lower Extremity [name] Cleanse affected area with antibacterial soap and water, Wound Treatment Wound #11 - Calcaneus Wound Laterality: Left Cleanser: [employer]ram Ancillary Kit - 15 Day Supply (Generic) 3 x Per Week/30 Days Discharge Instructions: Use supplies as instructed; Kit contains: (15) Saline Bullets; (15) 3x3 Gauze; 15 pr Gloves Primary Dressing: Xeroform 4x4-HBD (in/in) 3 x Per Week/30 Days Discharge Instructions: Apply Xeroform 4x4-HBD (in/in) as directed Secondary Dressing: ABD Pad 5x9 (in/in) 3 x Per Week/30 Days Discharge Instructions: Cover with ABD pad Secondary Dressing: Gauze 3 x Per Week/30 Days Discharge Instructions: As directed: dry, moistened with saline or moistened with Dakins Solution Secured With: Conform 4'' - Conforming Stretch Gauze Bandage 4x75 (in/in) 3 x Per Week/30 Days Discharge Instructions: Apply as directed Secured With: Tubigrip Size D, 3x10 (in/yd) 3 x Per Week/30 Days Discharge Instructions: single layer Wound #12 - Ankle Wound Laterality: Left, Lateral Cleanser: [place] Ancillary Kit - 15 Day Supply (Generic) 3 x Per Week/30 Days [name], [name] ([id number]) Discharge Instructions: Use supplies as instructed; Kit contains: (15) Saline Bullets; (15) 3x3 Gauze; 15 pr Gloves Primary Dressing: Xeroform 4x4-HBD (in/in) (DME) (Dispense As Written) 3 x Per Week/30 Days Discharge Instructions: Apply Xeroform 4x4-HBD (in/in) as directed Secondary Dressing: ABD Pad 5x9 (in/in) (DME) (Generic) 3 x Per Week/30 Days Discharge Instructions: Cover with ABD pad Secondary Dressing: Gauze 3 x Per Week/30 Days Discharge Instructions: As directed: dry, moistened with saline or moistened with Dakins Solution Secured With: Medipore Tape - 90M Medipore H Soft Cloth Surgical Tape, 2x2 (in/yd) (DME) (Generic) 3 x Per Week/30 Days Secured With: Conform 4'' - Conforming Stretch Gauze Bandage  4x75 (in/in) (DME) (Generic) 3 x Per Week/30 Days Discharge Instructions: Apply as directed Secured With: Tubigrip Size D, 3x10 (in/yd) 3 x Per Week/30 Days Discharge Instructions: single layer Electronic Signature(s) Signed: 07/27/2022 4:37:38 PM By: Massie Kluver Signed: 07/27/2022 5:20:28 PM By: Worthy Keeler PA-C Entered By: Massie Kluver on 07/27/2022 14:40:56 [name], [name] ([id number]) -------------------------------------------------------------------------------- Problem List Details Patient Name: [name] Date of Service: 07/27/2022 1:30 PM Medical Record Number: [id number] Patient Account Number: [id number] Date of Birth/Sex: 09/07/1923 (88 y.[name]. F) Treating RN: Carlene Coria Primary Care Provider: Adrian Prows Other Clinician: Massie Kluver Referring Provider: Adrian Prows Treating Provider/Extender: Skipper Cliche in Treatment: 79 Active Problems ICD-10 Encounter Code Description Active Date MDM Diagnosis L97.828 Non-pressure chronic ulcer of other part of left lower leg with other 06/27/2022 No Yes specified severity S80.12XD Contusion of left lower leg, subsequent encounter 06/27/2022 No Yes L89.623 Pressure ulcer of left heel, stage 3 07/13/2022 No Yes I89.0 Lymphedema, not elsewhere classified 05/13/2021 No Yes I87.2 Venous insufficiency (chronic) (peripheral) 05/13/2021 No Yes Z95.0 Presence of cardiac pacemaker 05/13/2021 No Yes Z79.01 Long term (current) use of anticoagulants 05/13/2021 No Yes Inactive Problems ICD-10 Code Description Active Date Inactive Date 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, stage 3 05/13/2021 05/13/2021 S81.811A Laceration without foreign body,  right lower leg, initial encounter 05/25/2022 05/25/2022 S80.12XA Contusion of left lower leg, initial encounter 11/10/2021 11/10/2021 Resolved Problems Electronic Signature(s) Signed: 07/27/2022 2:14:42 PM By: Worthy Keeler PA-C Entered By: Worthy Keeler on 07/27/2022  14:14:42 [name], [name] ([id number]) [name], [name] ([id number]) -------------------------------------------------------------------------------- Progress Note Details Patient Name: [name] Date of Service: 07/27/2022 1:30 PM Medical Record Number: [id number] Patient Account Number: [id number] Date of Birth/Sex: Oct 13, 1923 (88 y.[name]. 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 Dr. Amalia Hailey the podiatrist saw her on 07/20/2017 and sent her to the wound center for possible hyperbaric oxygen therapy. past medical history of peripheral vascular disease, varicose veins, status post appendectomy, basal cell carcinoma excision from the left leg, cholecystectomy, pacemaker placement, right lower extremity angiography done by Dr. dew in March 2017 with placement of a stent. there is also note of a successful ablation of the right small saphenous vein done which was reviewed by ultrasound on 10/24/2016. the patient had a right small saphenous vein ablation done on 10/20/2016. The patient has never been a smoker. She has been seen by Dr. Corene Cornea dew the vascular surgeon who most recently saw her on 06/15/2017 for evaluation of ongoing problems with right leg swelling. She had a lower extremity arterial duplex examination done(02/13/17) which showed patent distal right superficial femoral artery stent and above-the-knee popliteal stent without evidence of restenosis. The ABI was more than 1.3  on the right and more than 1.3 on the left. This was consistent with noncompressible arteries due to medial calcification. The right great toe pressure and PPG waveforms are within normal limits and the left great toe pressure and PPG waveforms are decreased. he recommended she continue to wear her compression stockings and continue with elevation. She is scheduled to have a noninvasive arterial study in the near future 08/16/2017 -- had a lower extremity arterial duplex examination done which showed patent distal right superficial femoral artery stent and above-the-knee popliteal stent without evidence of restenosis. The ABI was more than 1.3 on the right and more than 1.3 on the left. This was consistent with noncompressible arteries due to medial calcification. The right great toe pressure and PPG waveforms are within normal limits and the left great toe pressure and PPG waveforms are decreased. the x-ray of the right ankle has not yet been done 08/24/2017 -- had a right ankle x-ray -- IMPRESSION:1. No fracture, bone lesion or evidence of osteomyelitis. 2. Lateral soft tissue swelling with a soft tissue ulcer. she has not yet seen the vascular surgeon for review 08/31/17 on evaluation today patient's wound appears to be showing signs of improvement. She still with her appointment with vascular in order to review her results of her vascular study and then determine if any intervention would be recommended at that time. No fevers, chills, nausea, or vomiting noted at this time. She has been tolerating the dressing changes without complication. 09/28/17 on evaluation today patient's wound appears to show signs of good improvement in regard to the granulation tissue which is surfacing. There is still a layer of slough covering the wound and the  posterior portion is still significantly deeper than the anterior nonetheless there has been some good sign of things moving towards the better. She is going  to go back to Dr. dew for reevaluation to ensure her blood flow is still appropriate. That will be before her next evaluation with [place] next week. No fevers, chills, nausea, or vomiting noted at this time. Patient does have some discomfort rated to be a 3-4/10 depending on activity specifically cleansing the wound makes it worse. 10/05/2017 -- the patient was seen by Dr. Lucky Cowboy last week and noninvasive studies showed a normal right ABI with brisk triphasic waveforms consistent with no arterial insufficiency including normal digital pressures. The duplex showed a patent distal right SFA stent and the proximal SFA was also normal. He was pleased with her test and thought she should have enough of perfusion for normal wound healing. He would see her back in 6 months time. 12/21/17 on evaluation today patient appears to be doing fairly well in regard to her right lateral ankle wound. Unfortunately the main issue that she is expansion at this point is that she is having some issues with what appears to be some cellulitis in the right anterior shin. She has also been noting a little bit of uncomfortable feeling especially last night and her ankle area. I'm afraid that she made the developing a little bit of an infection. With that being said I think it is in the early stages. 12/28/17 on evaluation today patient's ankle appears to be doing excellent. She's making good progress at this point the cellulitis seems to have improved after last week's evaluation. Overall she is having no significant discomfort which is excellent news. She does have an appointment with Dr. dew on March 29, 2018 for reevaluation in regard to the stent he placed. She seems to have excellent blood flow in the right lower extremity. 01/19/12 on evaluation today patient's wound appears to be doing very well. In fact she does not appear to require debridement at this point, there's no evidence of infection, and overall from the standpoint of  the wound she seems to be doing very well. With that being said I believe that it may be time to switch to different dressing away from the [facility] Dressing she tells me she does have a lot going on her friend actually passed away yesterday and she's also having a lot of issues with her husband this obviously is weighing heavy on her as far as your thoughts and concerns today. 01/25/18 on evaluation today patient appears to be doing fairly well in regard to her right lateral malleolus. She has been tolerating the dressing changes without complication. Overall I feel like this is definitely showing signs of improvement as far as how the overall appearance of the wound is there's also evidence of epithelium start to migrate over the granulation tissue. In general I think that she is progressing nicely as far as the wound is concerned. The only concern she really has is whether or not we can switch to every other week visits in order to avoid having as many JEWELIANA, DUDGEON ([id number]) appointments as her daughters have a difficult time getting her to her appointments as well as the patient's husband to his he is not doing very well at this point. 02/22/18 on evaluation today patient's right lateral malleolus ulcer appears to be doing great. She has been tolerating the dressing changes without complication. Overall you making excellent progress at this time. Patient is  having no significant discomfort. 03/15/18 on evaluation today patient appears to be doing much more poorly in regard to her right lateral ankle ulcer at this point. Unfortunately since have last seen her her husband has passed just a few days ago is obviously weighed heavily on her her daughter also had surgery well she is with her today as usual. There does not appear to be any evidence of infection she does seem to have significant contusion/deep tissue injury to the right lateral malleolus which was not noted previous when I  saw her last. It's hard to tell of exactly when this injury occurred although during the time she was spending the night in the hospital this may have been most likely. 03/22/18 on evaluation today patient appears to actually be doing very well in regard to her ulcer. She did unfortunately have a setback which was noted last week however the good news is we seem to be getting back on track and in fact the wound in the core did still have some necrotic tissue which will be addressed at this point today but in general I'm seeing signs that things are on the up and up. She is glad to hear this obviously she's been somewhat concerned that due to the how her wound digressed more recently. 03/29/18 on evaluation today patient appears to be doing fairly well in regard to her right lower extremity lateral malleolus ulcer. She unfortunately does have a new area of pressure injury over the inferior portion where the wound has opened up a little bit larger secondary to the pressure she seems to be getting. She does tell me sometimes when she sleeps at night that it actually hurts and does seem to be pushing on the area little bit more unfortunately. There does not appear to be any evidence of infection which is good news. She has been tolerating the dressing changes without complication. She also did have some bruising in the left second and third toes due to the fact that she may have bump this or injured it although she has neuropathy so she does not feel she did move recently that may have been where this came from. Nonetheless there does not appear to be any evidence of infection at this time. 04/12/18 on evaluation today patient's wound on the right lateral ankle actually appears to be doing a little bit better with a lot of necrotic docking tissue centrally loosening up in clearing away. However she does have the beginnings of a deep tissue injury on the left lateral malleolus likely due to the fact we've  been trying offload the right as much as we have. I think she may benefit from an assistive soft device to help with offloading and it looks like they're looking at one of the doughnut conditions that wraps around the lower leg to offload which I think will definitely do a good job. With that being said I think we definitely need to address this issue on the left before it becomes a wound. Patient is not having significant pain. 04/19/18 on evaluation today patient appears to be doing excellent in regard to the progress she's made with her right lateral ankle ulcer. The left ankle region which did show evidence of a deep tissue injury seems to be resolving there's little fluid noted underneath and a blister there's nothing open at this point in time overall I feel like this is progressing nicely which is good news. She does not seem to be having significant discomfort  at this point which is also good news. 04/25/18-She is here in follow up evaluation for bilateral lateral malleolar ulcers. The right lateral malleolus ulcer with pale subcutaneous tissue exposure, central area of ulcer with tendon/periosteum exposed. The left lateral malleolus ulcer now with central area of nonviable tissue, otherwise deep tissue injury. She is wearing compression wraps to the left lower extremity, she will place the right lower extremity compression wraps on when she gets home. She will be out of town over the weekend and return next week and follow-up appointment. She completed her doxycycline this morning 05/03/18 on evaluation today patient appears to be doing very well in regard to her right lateral ankle ulcer in general. At least she's showing some signs of improvement in this regard. Unfortunately she has some additional injury to the left lateral malleolus region which appears to be new likely even over the past several days. Again this determination is based on the overall appearance. With that being said the  patient is obviously frustrated about this currently. 05/10/18-She is here in follow-up evaluation for bilateral lateral malleolar ulcers. She states she has purchased offloading shoes/boots and they will arrive tomorrow. She was asked to bring them in the office at next week's appointment so her provider is aware of product being utilized. She continues to sleep on right or left side, she has been encouraged to sleep on her back. The right lateral malleolus ulcer is precariously close to peri-osteum; will order xray. The left lateral malleolus ulcer is improved. Will switch back to santyl; she will follow up next week. 05/17/18 on evaluation today patient actually appears to be doing very well in regard to her malleolus her ulcers compared to last time I saw them. She does not seem to have as much in the way of contusion at this point which is great news. With that being said she does continue to have discomfort and I do believe that she is still continuing to benefit from the offloading/pressure reducing boots that were recommended. I think this is the key to trying to get this to heal up completely. 05/24/18 on evaluation today patient actually appears to be doing worse at this point in time unfortunately compared to her last week's evaluation. She is having really no increased pain which is good news unfortunately she does have more maceration in your theme and noted surrounding the right lateral ankle the left lateral ankle is not really is erythematous I do not see signs of the overt cellulitis on that side. Unfortunately the wounds do not seem to have shown any signs of improvement since the last evaluation. She also has significant swelling especially on the right compared to previous some of this may be due to infection however also think that she may be served better while she has these wounds by compression wrapping versus continuing to use the Juxta-Lite for the time being. Especially with the  amount of drainage that she is experiencing at this point. No fevers, chills, nausea, or vomiting noted at this time. 05/31/18 on evaluation today patient appears to actually be doing better in regard to her right lateral lower extremity ulcer specifically on the malleolus region. She has been tolerating the antibiotic without complication. With that being said she still continues to have issues but a little bit of redness although nothing like she what she was experiencing previous. She still continues to pressure to her ankle area she did get the problem on offloading boots unfortunately she will not wear  them she states there too uncomfortable and she can't get in and out of the bed. Nonetheless at this point her wounds seem to be continually getting worse which is not what we want I'm getting somewhat concerned about her progress and how things are going to proceed if we do not intervene in some way shape or form. I therefore had a very lengthy conversation today about offloading yet again and even made a specific suggestion for switching her to a memory foam mattress and even gave the information for a specific one that they could look at getting if it was something that they were interested in considering. She does not want to be considered for a hospital bed air mattress although honestly insurance would not cover it that she does not have any wounds on her trunk. 06/14/18 on evaluation today both wounds over the bilateral lateral malleolus her ulcers appear to be doing better there's no evidence of pressure injury at this point. She did get the foam mattress for her bed and this does seem to have been extremely beneficial for her in my pinion. Her daughter states that she is having difficulty getting out of bed because of how soft it is. The patient also relates this to be. Nonetheless I do feel like she's actually doing better. Unfortunately right after and around the time she was getting the  mattress she also sustained a fall when she got up to go pick up the phone and ended up injuring her right elbow she has 18 sut[address] place. We are not caring for this currently although home health is going to be taking the sutures out shortly. Nonetheless this may be something that we need to evaluate going forward. It depends on MARIDEL, PIXLER ([id number]) how well it has or has not healed in the end. She also recently saw an orthopedic specialist for an injection in the right shoulder just before her fall unfortunately the fall seems to have worsened her pain. 06/21/18 on evaluation today patient appears to be doing about the same in regard to her lateral malleolus ulcers. Both appear to be just a little bit deeper but again we are clinging away the necrotic and dead tissue which I think is why this is progressing towards a deeper realm as opposed to improving from my measurement standpoint in that regard. Nonetheless she has been tolerating the dressing changes she absolutely hates the memory foam mattress topper that was obtained for her nonetheless I do believe this is still doing excellent as far as taking care of excess pressure in regard to the lateral malleolus regions. She in fact has no pressure injury that I see whereas in weeks past it was week by week I was constantly seeing new pressure injuries. Overall I think it has been very beneficial for her. 07/03/18; patient arrives in my clinic today. She has deep punched out areas over her bilateral lateral malleoli. The area on the right has some more depth. We spent a lot of time today talking about pressure relief for these areas. This started when her daughter asked for a prescription for a memory foam mattress. I have never written a prescription for a mattress and I don't think insurances would pay for that on an ordinary bed. In any case he came up that she has foam boots that she refuses to wear. I would suggest going to these  before any other offloading issues when she is in bed. They say she is meticulous about  offloading this the rest of the day 07/10/18- She is seen in follow-up evaluation for bilateral, lateral malleolus ulcers. There is no improvement in the ulcers. She has purchased and is sleeping on a memory foam mattress/overlay, she has been using the offloading boots nightly over the past week. She has a follow up appointment with vascular medicine at the end of October, in my opinion this follow up should be expedited given her deterioration and suboptimal TBI results. We will order plain film xray of the left ankle as deeper structures are palpable; would consider having MRI, regardless of xray report(s). The ulcers will be treated with iodoflex/iodosorb, she is unable to safely change the dressings daily with santyl. 07/19/18 on evaluation today patient appears to be doing in general visually well in regard to her bilateral lateral malleolus ulcers. She has been tolerating the dressing changes without complication which is good news. With that being said we did have an x-ray performed on 07/12/18 which revealed a slight loosen see in the lateral portion of the distal left fibula which may represent artifact but underline lytic destruction or osteomyelitis could not be excluded. MRI was recommended. With that being said we can see about getting the patient scheduled for an MRI to further evaluate this area. In fact we have that scheduled currently for August 20 19,019. 07/26/18 on evaluation today patient's wound on the right lateral ankle actually appears to be doing fairly well at this point in my pinion. She has made some good progress currently. With that being said unfortunately in regard to the left lateral ankle ulcer this seems to be a little bit more problematic at this time. In fact as I further evaluated the situation she actually had bone exposed which is the first time that's been the case in the bone  appear to be necrotic. Currently I did review patient's note from Dr. Bunnie Domino office with [facility]. He stated that ABI was 1.26 on the right and 0.95 on the left with good waveforms. Her perfusion is stable not reduced from previous studies and her digital waveforms were pretty good particularly on the right. His conclusion upon review of the note was that there was not much she could do to improve her perfusion and he felt she was adequate for wound healing. His suggestion was that she continued to see [place] and consider a synthetic skin graft if there was no underlying infection. He plans to see her back in six months or as needed. 08/01/18 on evaluation today patient appears to be doing better in regard to her right lateral ankle ulcer. Her left lateral ankle ulcer is about the same she still has bone involvement in evidence of necrosis. There does not appear to be evidence of infection at this time On the right lateral lower extremity. I have started her on the Augmentin she picked this up and started this yesterday. This is to get her through until she sees infectious disease which is scheduled for 08/12/18. 08/06/18 on evaluation today patient appears to be doing rather well considering my discussion with patient's daughter at the end of last week. The area which was marked where she had erythema seems to be improved and this is good news. With that being said overall the patient seems to be making good improvement when it comes to the overall appearance of the right lateral ankle ulcer although this has been slow she at least is coming around in this regard. Unfortunately in regard to  the left lateral ankle ulcer this is osteomyelitis based on the pathology report as well is bone culture. Nonetheless we are still waiting CT scan. Unfortunately the MRI we originally ordered cannot be performed as the patient is a pacemaker which I had overlooked. Nonetheless we are working on the  CT scan approval and scheduling as of now. She did go to the hospital over the weekend and was placed on IV Cefzo for a couple of days. Fortunately this seems to have improved the erythema quite significantly which is good news. There does not appear to be any evidence of worsening infection at this time. She did have some bleeding after the last debridement therefore I did not perform any sharp debridement in regard to left lateral ankle at this point. Patient has been approved for a snap vac for the right lateral ankle. 08/14/18; the patient with wounds over her bilateral lateral malleoli. The area on the right actually looks quite good. Been using a snap back on this area. Healthy granulation and appears to be filling in. Unfortunately the area on the left is really problematic. 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 Also noted to have cellulitis. She saw Dr. Novella Olive of infectious disease today and was put on linezolid. We are able to verify this with her pharmacy. She is completed the Augmentin that she was already on. We've been using Iodoflex to this area 08/23/18 on evaluation today patient's wounds both actually appear to be doing better compared to my prior evaluations. Fortunately she showing signs of good improvement in regard to the overall wound status especially where were using the snap vac on the right. In regard to left lateral malleolus the wound bed actually appears to be much cleaner than previously noted. I do not feel any phone directly probed during evaluation today and though there is tendon noted this does not appear to be necrotic it's actually fairly good as far as the overall appearance of the tendon is concerned. In general the wound bed actually appears to be doing significantly better than it was previous. Patient is currently in the care of [name] and I did review that note today. He  actually has her on two weeks of linezolid and then following the patient will be on 1-2 months of Keflex. That is the plan currently. She has been on antibiotics therapy as prescribed by myself initially starting on July 30, 2018 and has been on that continuously up to this point. 08/30/18 on evaluation today patient actually appears to be doing much better in regard to her right lateral malleolus ulcer. She has been tolerating the dressing changes specifically the snap vac without complication although she did have some issues with the seal currently. Apparently there was some trouble with getting it to maintain over the past week past Sunday. Nonetheless overall the wound appears better in regard to the right lateral malleolus region. In regard to left lateral malleolus this actually show some signs of additional granulation although there still tendon noted in the base of the wound this appears to be healthy not necrotic in any way whatsoever. We are considering potentially using a snap vac for the left lateral malleolus as well the product wrap from [employer], [place], was present in the clinic today we're going to see this patient I did have her come in with me after obtaining consent from the patient and her daughter in order  to look at the wound and see if there's any recommendation one way or another as to whether or not they felt the snapback could be beneficial for the left lateral malleolus region. But DAVY, FAUGHT ([id number]) the conclusion was that it might be but that this is definitely a little bit deeper wound than what traditionally would be utilized for a snap vac. 09/06/18 on evaluation today patient actually appears to be doing excellent in my pinion in regard to both ankle ulcers. She has been tolerating the dressing changes without complication which is great news. Specifically we have been using the snap vac. In regard to the right ankle I'm not even sure that this is going to  be necessary for today and following as the wound has filled in quite nicely. In regard to the left ankle I do believe that we're seeing excellent epithelialization from the edge as well as granulation in the central portion the tendon is still exposed but there's no evidence of necrotic bone and in general I feel like the patient has made excellent progress even compared to last week with just one week of the snap vac. 09/11/18; this is a patient who has wounds on her bilateral lateral malleoli. Initially both of these were deep stage IV wounds in the setting of chronic arterial insufficiency. She has been revascularized. As I understand think she been using snap vacs to both of these wounds however the area on the right became more superficial and currently she is only using it on the left. Using silver collagen on the right and silver collagen under the back on the left I believe 09/19/18 on evaluation today patient actually appears to be doing very well in regard to her lateral malleolus or ulcers bilaterally. She has been tolerating the dressing changes without complication. Fortunately there does not appear to be any evidence of infection at this time. Overall I feel like she is improving in an excellent manner and I'm very pleased with the fact that everything seems to be turning towards the better for her. This has obviously been a long road. 09/27/18 on evaluation today patient actually appears to be doing very well in regard to her bilateral lateral malleolus ulcers. She has been tolerating the dressing changes without complication. Fortunately there does not appear to be any evidence of infection at this time which is also great news. No fevers, chills, nausea, or vomiting noted at this time. Overall I feel like she is doing excellent with the snap vac on the left malleolus. She had 40 mL of fluid collection over the past week. 10/04/18 on evaluation today patient actually appears to be  doing well in regard to her bilateral lateral malleolus ulcers. She continues to tolerate the dressing changes without complication. One issue that I see is the snap vac on the left lateral malleolus which appears to have sealed off some fluid underlying this area and has not really allowed it to heal to the degree that I would like to see. For that reason I did suggest at this point we may want to pack a small piece of packing strip into this region to allow it to more effectively wick out fluid. 10/11/18 in general the patient today does not feel that she has been doing very well. She's been a little bit lethargic and subsequently is having bodyaches as well according to what she tells me today. With that being said overall she has been concerned with the fact that something may  be worsening although to be honest her wounds really have not been appearing poorly. She does have a new ulcer on her left heel unfortunately. This may be pressure related. Nonetheless it seems to me to have potentially started at least as a blister I do not see any evidence of deep tissue injury. In regard to the left ankle the snap vac still seems to be causing the ceiling off of the deeper part of the wound which is in turn trapping fluid. I'm not extremely pleased with the overall appearance as far as progress from last week to this week therefore I'm gonna discontinue the snap vac at this point. 10/18/18 patient unfortunately this point has not been feeling well for the past several days. She was seen by Grayland Ormond her primary care provider who is a Librarian, academic at [facility]. Subsequently she states that she's been very weak and generally feeling malaise. No fevers, chills, nausea, or vomiting noted at this time. With that being said bloodwork was performed at the PCP office on the 11th of this month which showed a white blood cell count of 10.7. This was repeated today and shows a white blood cell count  of 12.4. This does show signs of worsening. Coupled with the fact that she is feeling worse and that her left ankle wound is not really showing signs of improvement I feel like this is an indication that the osteomyelitis is likely exacerbating not improving. Overall I think we may also want to check her C-reactive protein and sedimentation rate. Actually did call [facility] office this afternoon while the patient was in the office here with me. Subsequently based on the findings we discussed treatment possibilities and I think that it is appropriate for [place] to go ahead and initiate treatment with doxycycline which I'm going to do. Subsequently he did agree to see about adding a CRP and sedimentation rate to her orders. If that has not already been drawn to where they can run it they will contact the patient she can come back to have that check. They are in agreement with plan as far as the patient and her daughter are concerned. Nonetheless also think we need to get in touch with Dr. Henreitta Leber office to see about getting the patient scheduled with him as soon as possible. 11/08/18 on evaluation today patient presents for follow-up concerning her bilateral foot and ankle ulcers. I did do an extensive review of her chart in epic today. Subsequently she was seen by [name] he did initiate Cefepime IV antibiotic therapy. Subsequently she had some issues with her PICC line this had to be removed because it was coiled and then replaced. Fortunately that was now settled. Unfortunately she has continued have issues with her left heel as well as the issues that she is experiencing with her bilateral lateral malleolus regions. I do believe however both areas seem to be doing a little bit better on evaluation today which is good news. No fevers, chills, nausea, or vomiting noted at this time. She actually has an angiogram schedule with Dr. dew on this coming Monday, November 11, 2018. Subsequently the patient  states that she is feeling much better especially than what she was roughly 2 weeks ago. She actually had to cancel an appointment because she was feeling so poorly. No fevers, chills, nausea, or vomiting noted at this time. 11/15/18 on evaluation today patient actually is status post having had her angiogram with Dr. dew Monday, four days ago.  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 He had to go and work on several areas of the vasculature fortunately he was able to obtain no more than a 30% residual stenosis throughout post procedure. I reviewed this note today. I think this will definitely help with healing at this time. Fortunately there does not appear to be any signs of infection and I do feel like ratio already has a better appearance to it. 11/22/18 upon evaluation today patient actually appears to be doing very well in regard to her wounds in general. The right lateral malleolus looks excellent the heel looks better in the left lateral malleolus also appears to be doing a little better. With that being said the right second toe actually appears to be open and training we been watching this is been dry and stable but now is open. 12/03/2018 Seen today for follow-up and management of multiple bilateral lower extremity wounds. New pressure injury of the great toe which is closed at this time. Wound of the right distal second toe appears larger today with deep undermining and a pocket of fluid present within the undermining region. Left and right malleolus is wounds are stable today with no signs and symptoms of infection.Denies any needs or concerns during exam today. 12/13/18 on evaluation today patient appears to be doing somewhat better in regard to her left heel ulcer. She also seems to be completely healed in regard to the right lateral malleolus ulcer. The left malleolus ulcer is smaller what unfortunately the wounds which are new over the first and second toes  of the right foot are what are most concerning at this point especially the second. Both areas did require sharp debridement today. 12/20/18 on evaluation today patient's wound actually appears to be doing better in regard to left lateral ankle and her right lateral ankle continues to remain healed. The hill ulcer on the left is improved. She does have improvement noted as well in regard to both toe ulcers. Overall I'm very pleased in this regard. No fevers, chills, nausea, or vomiting noted at this time. KRISTEE, ANGUS ([id number]) 12/23/18 on evaluation today patient is seen after she had her toenails trimmed at the podiatrist office due to issues with her right great toe. There was what appeared to be dark eschar on the surface of the wound which had her in the podiatrist concerned. Nonetheless as I remember that during the last office visit I had utilize silver nitrate of this area I was much less concerned about the situation. Subsequently I was able to clean off much of this tissue without any complication today. This does not appear to show any signs of infection and actually look somewhat better compared to last time post debridement. Her second toe on the right foot actually had callous over and there did appear still be some fluid underneath this that would require debridement today. 12/27/18 on evaluation today patient actually appears to be showing signs of improvement at all locations. Even the left lateral ankle although this is not quite as great as the other sites. Fortunately there does not appear to be any signs of infection at this time and both of her toes on the right foot seem to be showing signs of improvement which is good news and very pleased in this regard. 01/03/19 on evaluation today patient appears to be doing better for the most part in regard to her wounds in particular. There does not appear to  be any evidence of infection at this time which is good news. Fortunately  there is no sign of really worsening anywhere except for the right great toe which she does have what appears to be a bruise/deep tissue injury which is very superficial and already resolving. I'm not sure where this came from I questioned her extensively and she does not recall what may have happened with this. Other than that the patient seems to be doing well even the left lateral ankle ulcer looks good and is getting smaller. 01/10/19 on evaluation today patient appears to be doing well in regard to her left heel wound and both of her toe wounds. Overall I feel like there is definitely improvement here and I'm happy in that regard. With that being said unfortunately she is having issues with the left lateral malleolus ulcer which unfortunately still has a lot of depth to it. This is gonna be a very difficult wound for [place] to be able to truly get to heal. I may want to consider some type of skin substitute to see if this would be of benefit for her. I'll discuss this with her more the next visit most likely. This was something I thought about more at the end of the visit when I was Artie out of the room and the patient had been discharged. 01/17/19 on evaluation today patient appears to be doing very well in regard to her wounds in general. She's been making excellent progress at this time. Fortunately there's no sign of infection at this time either. No fevers, chills, nausea, or vomiting noted at this time. The biggest issue is still her left lateral malleolus where it appears to be doing well and is getting smaller but still shows a small corner where this is deeper and goes down into what appears to be the joint space. Nonetheless this is taking much longer to heal although it still looks better in smaller than previous evaluations. 01/24/19 on evaluation today patient's wounds actually appear to be doing rather well in general overall. She did require some sharp debridement in regard to the right  great toe but everything else appears to be doing excellent no debridement was even necessary. No fevers, chills, nausea, or vomiting noted at this time. 01/31/19 on evaluation today patient actually appears to be doing much better in regard to her left foot wound on the heel as well as the ankle. The right great toe appears to be a little bit worse today this had callous over and trapped a lot of fluid underneath. Fortunately there's no signs of infection at any site which is great news. 02/07/19 on evaluation today patient actually appears to be doing decently well in regard to all of her ulcers at this point. No sharp debridement was required she is a little bit of hyper granulation in regard to the left lateral ankle as well as the left heel but the hill itself is almost completely healed which is excellent news. Overall been very pleased in this regard. 02/14/19 on evaluation today patient actually appears to be doing very well in regard to her ulcers on the right first toe, left lateral malleolus, and left heel. In fact the heel is almost completely healed at this point. The patient does not show any signs of infection which is good news. Overall very pleased with how things have progressed. 04/18/19 Telehealth Evaluation During the COVID-19 National Emergency: Verbal Consent: Obtained from patient Allergies: reviewed and the active list is current. Medication  changes: patient has no current medication changes. COVID-19 Screening: 1. Have you traveled internationally or on a cruise ship in the last 14 dayso No 2. Have you had contact with someone with or under investigation for COVID-19o No 3. Have you had a fever, cough, sore throat, or experiencing shortness of breatho No on evaluation today actually did have a visit with this patient through a telehealth encounter with her home health nurse. Subsequently it was noted that the patient actually appears to be doing okay in regard to her wounds  both the right great toe as well as the left lateral malleolus have shown signs of improvement although this in your theme around the left lateral malleolus there eschar coverings for both locations. The question is whether or not they are actually close and whether or not home health needs to discharge the patient or not. Nonetheless my concern is this point obviously is that without actually seeing her and being able to evaluate this directly I cannot ensure that she is completely healed which is the question that I'm being asked. 04/22/19 on evaluation today patient presents for her first evaluation since last time I saw her which was actually February 14, 2019. I did do a telehealth visit last week in which point it was questionable whether or not she may be healed and had to bring her in today for confirmation. With that being said she does seem to be doing quite well at this point which is good news. There does not appear to be any drainage in the deed I believe her wounds may be healed. Readmission: 09/04/2019 on evaluation today patient appears to be doing unfortunately somewhat more poorly in regard to her left foot ulcer secondary to a wound that began on 08/21/2019 at least when she first noticed this. Fortunately she has not had any evidence of active infection at this time. Systemically. I also do not necessarily see any evidence of infection at the blister/wound site on the first metatarsal head plantar aspect. This almost appears to be something that may have just rubbed inappropriately causing this to breakdown. They did not want a wait too long to come in to be seen as again she had significant issues in the past with wounds that took quite a while to heal in fact it was close to 2 years. Nonetheless this does not appear to be quite that bad but again we do need to remove some of the necrotic tissue from the surface of the wound to tell exactly the extent. She does not appear to have any  significant arterial disease at this point and again her last ABIs and TBI's are recorded above in the alert section her left ABI was 1.27 with a TBI of 0.72 to the right ABI 1.08 with a TBI of 0.39. Other than this the patient has been doing quite CAROLIE, MCILRATH ([id number]) well since I last saw her and that was in May 2020. 09/11/2019 on evaluation today patient appeared to be doing very well with regard to her plantar foot ulcer on the left. In fact this appears to be almost completely healed which is awesome. That is after just 1 week of intervention. With that being said there is no signs of active infection at this time. 09/18/2019 on evaluation today patient actually appears to be doing excellent in fact she is completely healed based on what I am seeing at this point. Fortunately there is no signs of active infection at this time and  overall patient is very pleased to hear that this area has healed so quickly. Readmission: 05/13/2021 upon evaluation today this patient presents for reevaluation here in the clinic. This is a wound that actually we previously took care of. She had 1 on the right ankle and the left the left turned out to be be harder due to to heal but nonetheless is doing great at this point as the right that has reopened and it was noted first just several weeks ago with a scab over it and came off in just the past few days. Fortunately there does not appear to be any obvious evidence of significant active infection at this time which is great news. No fevers, chills, nausea, vomiting, or diarrhea. 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 There does not appear to be any signs of this interfering in any way with her wound. She does have swelling we previously had compression socks for her ordered but again it does not look like she wears these on a regular basis by any means. 05/26/2021 upon evaluation today patient appears  to be doing well with regard to her wound which is actually showing signs of excellent improvement. There does not appear to be any signs of active infection which is great news and overall very pleased with where things stand today. No fevers, chills, nausea, vomiting, or diarrhea. 06/02/2021 upon evaluation today patient's wound actually showing signs of excellent improvement. Fortunately there does not appear to be any signs of active infection which is great news. I think the patient is making good progress with regard to her wounds in general. 06/09/2021 upon evaluation today patient appears to be doing excellent in regard to her wounds currently. Fortunately there does not appear to be any signs of active infection which is great news. No fevers, chills, nausea, vomiting, or diarrhea. Overall extremely pleased with where things stand today. I think the patient is making excellent progress. 06/16/2021 upon evaluation today patient appears to be doing well in regard to her wound. This is going require little bit of debridement today and that was discussed with the patient. Otherwise she seems to be doing quite well and I am actually very pleased with where things stand at this point. No fevers, chills, nausea, vomiting, or diarrhea. 06/23/2021 upon evaluation today patient appears to be doing well with regard to her wounds. She has been tolerating the dressing changes without complication. Fortunately there does not appear to be any evidence of infection and she has not had air in her home which she actually lives at an assisted living that got fixed this morning. With that being said because of that her wrap has been extremely hot and bothersome for her over the past week. 06/30/2021 upon evaluation today patient is actually making excellent progress in regard to her ankle ulcer. She has been tolerating the dressing changes without complication and overall extremely pleased with where things stand  there does not appear to be any evidence of active infection which is great news. No fevers, chills, nausea, vomiting, or diarrhea. 07/07/21 upon evaluation today patients and culture on the right actually appears to be doing quite well. There does not appear to be any signs of infection and overall very pleased with where things stand today. No fevers, chills, nausea, or vomiting noted at this time. 07/14/2021 unfortunately the patient today has some evidence of deep tissue injury and pressure getting to the ankle region. Again  I am not exactly sure what is going on here but this is very similar to issues that we have had in the past. I explained to the patient that she needs to be very mindful of exactly what is happening I think sleeping in bed is probably the main issue here although there could be other culprits I am not sure what else would potentially lead to this kind of a problem for her. 07/21/2021 upon evaluation today patient's wound actually showing signs of improvement compared to last week. Fortunately there does not appear to be any signs of active infection which is great news and overall very pleased with where things stand in that regard. With that being said I do believe that she is continuing to show signs of overall of getting better although I think this is still basically about what we were 2 weeks ago due to the worsening and now improvement. 07/28/2021 upon evaluation today patient appears to be doing well with regard to her wound. She does have some slough buildup on the surface of the wound which I would have to manage today. Fortunately there is no sign of active infection at this time. No fevers, chills, nausea, vomiting, or diarrhea. 08/04/2021 upon evaluation today patient appears to be doing about the same in regard to her wound. To be perfectly honest I am beginning to be a little bit concerned about the overall appearance of the wound bed. I do think possibly taking a  sample right around the margin of the wound could be beneficial for her as far as identifying anything such as an inflammatory process or to be honest even a skin tag cancer type process that may be of concern here. Fortunately there does not appear to be any evidence of active infection at this time which is great news she is not having any pain also great news. 08/11/2021 upon evaluation today patient appears to be doing well with regard to her wound. The good news is I did review her biopsy results and it showed some inflammatory mixed findings but nothing that appeared to be malignant which is great news. Overall this is more of a chronic venous stasis type issue which again is more what we have been treating. Nonetheless I just wanted to make sure before going forward that there was not anything more untoward going on at this point. 08/18/2021 upon evaluation today patient appears to be doing well with regard to her ankle ulcer. Fortunately there does not appear to be any signs of active infection at this time which is great overall wound is dramatically improved compared to last week. Since last week I have actually placed her on doxycycline and subsequently this is a good option as far as the findings are concerned at this point. 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 the doxycycline is doing that. There does not appear to be any evidence of active infection systemically which is great news. 08/25/2021 upon evaluation today patient appears to be doing well with regard to her wound. I feel like we are finally get back on track as far as healing is concerned I am much happier with the overall appearance today. I do think that she is tolerating the dressing changes without complication which is great news. We have been using Hydrofera Blue which I think is a good option.  The good news is she is also doing great in [place], BLIMI GODBY. ([id number]) regard to her compression sock on the left which is a zipper compression that seems to be doing a great job keeping her edema under good control. 09/01/2021 upon evaluation today patient appears to be doing well with regard to her wound. Infection seems to be under much better control which is great news and very pleased in that regard. Fortunately there does not appear to be any signs of infection currently. 09/08/2021 upon evaluation today patient actually appears to be making good progress in regard to her wound. She has been tolerating the dressing changes without complication. Fortunately there does not appear to be any evidence of active infection at this time which is great news as well. No fevers, chills, nausea, vomiting, or diarrhea. 09/22/2021 upon evaluation today patient appears to be doing well with regard to her wound although is very slow to heal. We have not looked into Apligraf yet I think that is something that we should see about doing. 09/29/2021 upon evaluation today patient's wound is actually showing signs of doing about the same. I am not seeing any evidence of worsening but also no significant evidence of improvement. We did gain approval for the organogenesis products all except for Apligraf as covered by her insurance. With that being said I do think that we can go ahead and proceed with the NuShield if the patient and her family in agreement of the plan I discussed that with him today she does have a 20% coinsurance which we also discussed. 10/06/2021 upon evaluation today patient appears to unfortunately be doing a little bit worse she appears to be infected based on what I am seeing. Fortunately there does not appear to be any signs of active infection at this time which is great news. Unfortunately it does appear to be some evidence of around the wound edge indicated by way of erythema and warmth  as well as redness 10/13/2021 upon evaluation today patient actually appears to be doing excellent in regard to her ankle ulcer compared to what it was. 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 I think were fine to go ahead and apply the NuShield today. 10/20/2021 upon evaluation today patient appears to be doing excellent in fact the NuShield seems to have done all some for her thus far. I am actually very pleased with where we stand and overall I think that she is making great progress. There is no evidence of active infection at this time. 11/23; patient presents for follow-up. She has no issues or complaints today. She denies signs of infection. She reports stability in her wound healing. 11/03/2021 upon evaluation today patient appears to be doing well with regard to her wounds. She has been tolerating the dressing changes without complication. Fortunately there is no signs of active infection at this time. 11/10/2021 upon evaluation today patient appears to be doing well with regard to her right ankle which is actually showing signs of improvement with the NuShield I am very pleased. Subsequently in regards to the left ankle this appears to be doing okay with no evidence of issue here either. With that being said she has a significant contusion on the left leg from having fallen 2 days ago. She tells me that  she was using her walker going to the closet and then when she got to the closet turned around to get something out at which point she fell according to the story. Nonetheless she does have a hematoma just below her knee on the anterior portion of her shin. My hope is that this will not open until wound although we do need I think Some compression over it also think that we need to have her use an  ice as well to help with the swelling and prevent this from getting worse. The last thing she needs is a wound opened up here. 11/17/2021 upon evaluation patient's right ankle actually showing signs of improvement based on what I see currently there is a lot of new skin growth coming in which is great news. Nonetheless I do feel like that the patient is showing improvement as well in regard to her left leg with the use of the Tubigrip it swollen but not as bruised as what I would have expected after what I was seeing last week. Nonetheless I do think doubling up on the Tubigrip would probably be beneficial to try to keep some of the edema under better control here. 11/24/2021 upon evaluation today patient appears to be doing excellent in regard to her wound. This is actually measuring significantly smaller which is great news. Fortunately I do not see any signs of active infection locally nor systemically at this point. Overall I am very pleased with how the NuShield is doing. 12/30; wound bed looks healthy however not much change in overall wound volume. We applied Nushield again today in the standard fashion 12/08/2021 upon evaluation today patient's wound actually showing signs of good improvement and I am actually very pleased with where we stand today as well. I do not see any evidence of active infection locally nor systemically which is great news. Unfortunately she has been having some issues with stomach upset and diarrhea today. 12/15/2021 upon evaluation today patient appears to be doing excellent in regard to her wound. There is a little bit of dry skin raised up around the edges of the wound but fortunately nothing too significant at this point. Fortunately I do not see any evidence of active infection either which is great news. No fevers, chills, nausea, vomiting, or diarrhea. 12/29/2021 upon evaluation today patient appears to be doing well with regard to her wound. In general I feel like  she is getting very close to complete resolution. I think that she is making good progress here as well. Overall I do not see any signs of infection and very little of this appears to actually be open. 01/12/2022 upon evaluation today patient appears to be doing a little bit worse in regard to her wound. It appears that the collagen actually trapped fluid underneath the collagen which got hard and subsequently caused the fluid to back up. This patient has made the wound appear to be larger than what it was previous. With that being said I do not see any signs of obvious infection is not warm to touch and not significantly erythematous either which is good news. Nonetheless I do think that we will need to continue to keep an eye on this. Probably not go put on antibiotic this week but I will be too far from doing so she is actually on a Z-Pak right now I do not want to really double up on antibiotics. 01/26/2022 upon evaluation today patient's wound is showing signs of improvement although this is  slow to turn back around. Fortunately there does not appear to be any evidence of active infection locally or systemically which is great news and overall I am extremely pleased with where we stand today. No fevers, chills, nausea, vomiting, or diarrhea. 02/02/2022 upon evaluation today patient appears to be doing well with regard to her wound. She has been tolerating the dressing changes without complication. Fortunately there does not appear to be any signs of active infection locally nor systemically at this time which is great news. No fevers, chills, nausea, vomiting, or diarrhea. CORTLYN, CANNELL ([id number]) 3/9; patient presents for follow-up. She has no issues or complaints today. She denies signs of infection. 02/16/2022 upon evaluation today patient appears to be doing well with regard to her wound. She has been tolerating the dressing changes without complication. Fortunately I do not see any  evidence of active infection locally nor systemically at this point which is great news. Overall I think that the patient is making excellent progress in general. The wound is measuring smaller and though there does appear to be some need for sharp debridement today this is minimal compared to what we have noted previous. 02/23/2022 upon evaluation today patient appears to be doing well with regard to her wound she is definitely showing signs of improvement which is great news. 03/02/2022 upon evaluation today patient appears to be doing about the same in regard to her wound. Unfortunately were not seeing significant improvement. With that being said is also not significantly worse which is great news. No fevers, chills, nausea, vomiting, or diarrhea. 03-09-2022 upon evaluation today patient appears to be doing well with regard to her wound. She has been tolerating the dressing changes without complication. Fortunately there does not appear to be any signs of active infection locally or systemically which is great news. 03-16-2022 upon evaluation today patient appears to be doing well with regard to her wound this is measuring smaller and looking much better. I am actually very pleased with where we stand and I think that the patient is making great progress. I do not see any evidence of active infection locally or systemically which is great news. No fevers, chills, nausea, vomiting, or diarrhea. 03-23-2022 upon evaluation today patient appears to be doing better in regard to her wounds. In fact the wound area actually is showing signs of excellent improvement and actually very pleased with where we stand today. I do not see any evidence of active infection locally nor systemically which is great news. 4/27; comes in today with again thick callus around the wound circumference which I removed with a curette. Some debris on the surface. Overall I do not think quite as good as it was last week by  description. Using endoform 04-06-2022 upon evaluation today patient appears to be doing pretty well in regard to her wound. Fortunately I do not see any evidence of active infection locally or systemically which is great news and overall I am pleased in that regard. I do feel like this is measuring smaller postdebridement compared to where we were previous. Overall she is headed in the right direction. She does have an area that is threatening to open and looks a little irritated on the left ankle we did measure this just for the discolored area for now its not draining too much but we want to monitor and make sure nothing worsens here. 04-13-2022 upon evaluation today patient appears to be doing better in regard to her wound although this is still  very slow to heal. We have reapplied for a skin substitute but have not heard anything back as of yet. Fortunately I do not see any evidence of active infection locally or systemically at this time which is great news. 04-20-2022 upon evaluation today patient's wound is actually showing signs of significant improvement which is great news. We did get approval for skin substitutes which I think is an option here for [place] but at the same time I am not even certain that this is going to be necessary especially considering how well things seem to be doing at this point. If she continues healing as we see her right now but I think were probably going to be able to avoid any need for additional skin substitute. Patient and her daughter are both in agreement with that plan. With that being said she did have a fall she has several areas of contusion fortunately nothing that is can require intervention at this point but nonetheless she did also fracture her rib which is extremely uncomfortable obviously. 04-27-2022 upon evaluation today patient appears to be doing well currently in regard to her wound on the right lateral ankle. Overall I feel like she is actually doing  significantly better even compared to last week and very pleased in this regard. I think we are on the right track here. 05-04-2022 upon evaluation today patient appears to be doing well with regard to her wound. She is going require some sharp debridement today to clear away some of the necrotic debris. Fortunately I was able to do this quite easily without significant issue or breakdown here. There is a very small area of opening centrally but really this is pretty close to getting close. 05-11-2022 upon evaluation today patient's wound is actually showing signs of excellent improvement. I do not see any evidence of infection locally or systemically at this time. Overall I think she is very close to complete resolution with just a little bit of debridement to clear away some of the dry crusty area around the edges of the wound currently. 05-18-2022 upon evaluation patient's wound bed actually appears to be likely healed although I cannot be 100% sure I am extremely pleased however with where things stand today. I do not see any evidence of infection locally or systemically which is great news and overall I think you are on the right track here. 05-25-2022 upon evaluation today patient appears to be doing well with regard to her leg ulcer. Fortunately there does not appear to be any signs of active infection locally or systemically at this time which is great news. No fevers, chills, nausea, vomiting, or diarrhea. With that being said she does appear to be healed in regard to her right lateral ankle which is great news. In regard to her leg unfortunately during the removal of the compression wrap there was a small skin tear that occurred with the scissors. Subsequently the patient does have a minimal amount of bleeding this is going require a couple Steri-Strips in order to seal this up. I do think it should likely heal quite well. Obviously this was unintentional. Nonetheless we do have to definitely  monitor make sure that this completely heals up as well I am hopeful that we will be the case by next week to be honest. 06-01-2022 upon evaluation today patient appears to be doing excellent in regard to her wounds. Both areas are completely healed which is great news. Fortunately I do not see any signs of active infection  locally or systemically at this time which is great and overall I am extremely pleased in that regard. No fevers, chills, nausea, vomiting, or diarrhea. 7/25; patient returns to clinic today with a story that 2 to 3 days ago she was sitting in her chair and she noticed bleeding from her left lower leg lateral aspect. She denies any trauma. She used pressure and a Band-Aid to get it to stop and she arrives in clinic today in follow-up. She was discharged on 6/29 apparently to stockings although she did not have any on and I do not think she was actually wearing them. She has significant chronic venous insufficiency 07-04-2022 upon evaluation today patient presents for follow-up. In the clinic she actually was seen last week when I was on vacation due to an area on her leg which reopened unfortunately. With that being said she does seem to be doing a little bit better but unfortunately this is still [place], [name] ([id number]) giving her some trouble here. All of her other wounds are still closed which I was previously seen her for and that is great news. 07-13-2022 upon evaluation today patient appears to be doing well with regard to her wound everything appears to be healed unfortunately she has a completely new area on her left heel which is a blister. Fortunately I do not see any signs of infection but unfortunately I do think this is going to be ongoing issue at this time. Question has been made whether or not we can get this to reabsorb or not. Again right now I am going to treat this more as a deep tissue injury currently. 07-20-2022 upon evaluation today patient's wound is  actually showing signs of being somewhat worse I definitely think she is infected at this point. We will get a need to subsequently have her started on antibiotic as soon as possible. She voiced understanding and her daughter is in agreement with this plan. We were hoping that we would get the blister reabsorb that just did not happen. 07-27-2022 upon evaluation today patient's ankle ulcer unfortunately is not doing nearly as well as well would like to see. Fortunately there does not appear to be any signs of infection locally or systemically at this time which is great news and overall I am extremely pleased. With that being said unfortunately the patient continues to have issues with her left lateral ankle which is now open and subsequently the heel which again is of concern here. Objective Constitutional Well-nourished and well-hydrated in no acute distress. Vitals Time Taken: 1:56 PM, Height: 62 in, Weight: 150 lbs, BMI: 27.4, Temperature: 98.2 F, Pulse: 74 bpm, Respiratory Rate: 18 breaths/min, Blood Pressure: 145/75 mmHg. Respiratory normal breathing without difficulty. Psychiatric this patient is able to make decisions and demonstrates good insight into disease process. Alert and Oriented x 3. pleasant and cooperative. General Notes: Patient's wounds did require sharp debridement clearway necrotic debris she tolerated debridement today without complication and postdebridement wound bed appears to be doing much better which is great news. Integumentary (Hair, Skin) Wound #11 status is Open. Original cause of wound was Pressure Injury. The date acquired was: 07/20/2022. The wound has been in treatment 1 weeks. The wound is located on the Left Calcaneus. The wound measures 5.5cm length x 2.5cm width x 0.2cm depth; 10.799cm^2 area and 2.16cm^3 volume. There is Fat Layer (Subcutaneous Tissue) exposed. There is a small amount of serosanguineous drainage noted. There is no granulation within  the wound bed. There is a  large (67-100%) amount of necrotic tissue within the wound bed including Eschar and Adherent Slough. Wound #12 status is Open. Original cause of wound was Gradually Appeared. The date acquired was: 07/17/2022. The wound has been in treatment 1 weeks. The wound is located on the Left,Lateral Ankle. The wound measures 6.2cm length x 2cm width x 0.1cm depth; 9.739cm^2 area and 0.974cm^3 volume. There is Fat Layer (Subcutaneous Tissue) exposed. There is a medium amount of serosanguineous drainage noted. There is medium (34-66%) red granulation within the wound bed. There is a medium (34-66%) amount of necrotic tissue within the wound bed including Adherent Slough. Assessment Active Problems ICD-10 Non-pressure chronic ulcer of other part of left lower leg with other specified severity Contusion of left lower leg, subsequent encounter Pressure ulcer of left heel, stage 3 Lymphedema, not elsewhere classified Venous insufficiency (chronic) (peripheral) Presence of cardiac pacemaker Long term (current) use of anticoagulants [name], [name] ([id number]) Procedures Wound #11 Pre-procedure diagnosis of Wound #11 is a Pressure Ulcer located on the Left Calcaneus . There was a Excisional Skin/Subcutaneous Tissue Debridement with a total area of 13.75 sq cm performed by Tommie Sams., PA-C. With the following instrument(s): Curette to remove Viable and Non-Viable tissue/material. Material removed includes Subcutaneous Tissue and Slough and. A time out was conducted at 14:23, prior to the start of the procedure. A Minimum amount of bleeding was controlled with Pressure. The procedure was tolerated well. Post Debridement Measurements: 5.5cm length x 2.5cm width x 0.1cm depth; 1.08cm^3 volume. Post debridement Stage noted as Category/Stage III. Character of Wound/Ulcer Post Debridement is stable. Post procedure Diagnosis Wound #11: Same as Pre-Procedure Wound #12 Pre-procedure  diagnosis of Wound #12 is a Venous Leg Ulcer located on the Left,Lateral Ankle .Severity of Tissue Pre Debridement is: Fat layer exposed. There was a Excisional Skin/Subcutaneous Tissue Debridement with a total area of 11 sq cm performed by Tommie Sams., PA-C. With the following instrument(s): Curette to remove Viable and Non-Viable tissue/material. Material removed includes Eschar, Subcutaneous Tissue, and Slough. A time out was conducted at 14:20, prior to the start of the procedure. A Minimum amount of bleeding was controlled with Pressure. The procedure was tolerated well. Post Debridement Measurements: 6.2cm length x 2cm width x 0.1cm depth; 0.974cm^3 volume. Character of Wound/Ulcer Post Debridement is stable. Severity of Tissue Post Debridement is: Fat layer exposed. Post procedure Diagnosis Wound #12: Same as Pre-Procedure Plan Follow-up Appointments: Return Appointment in 1 week. Bathing/ Shower/ Hygiene: May shower with wound dressing protected with water repellent cover or cast protector. No tub bath. Anesthetic (Use 'Patient Medications' Section for Anesthetic Order Entry): Lidocaine applied to wound bed Edema Control - Lymphedema / Segmental Compressive Device / Other: Tubigrip single layer applied. - Tubi D single layer left lower leg Elevate, Exercise Daily and Avoid Standing for Long Periods of Time. Elevate legs to the level of the heart and pump ankles as often as possible Elevate leg(s) parallel to the floor when sitting. Non-Wound Condition: Cleanse affected area with antibacterial soap and water, WOUND #11: - Calcaneus Wound Laterality: Left Cleanser: Byram Ancillary Kit - 15 Day Supply (Generic) 3 x Per Week/30 Days Discharge Instructions: Use supplies as instructed; Kit contains: (15) Saline Bullets; (15) 3x3 Gauze; 15 pr Gloves Primary Dressing: Xeroform 4x4-HBD (in/in) 3 x Per Week/30 Days Discharge Instructions: Apply Xeroform 4x4-HBD (in/in) as  directed Secondary Dressing: ABD Pad 5x9 (in/in) 3 x Per Week/30 Days Discharge Instructions: Cover with ABD pad Secondary Dressing: Gauze 3 x Per  Week/30 Days Discharge Instructions: As directed: dry, moistened with saline or moistened with Dakins Solution Secured With: Conform 4'' - Conforming Stretch Gauze Bandage 4x75 (in/in) 3 x Per Week/30 Days Discharge Instructions: Apply as directed Secured With: Tubigrip Size D, 3x10 (in/yd) 3 x Per Week/30 Days Discharge Instructions: single layer WOUND #12: - Ankle Wound Laterality: Left, Lateral Cleanser: Byram Ancillary Kit - 15 Day Supply (Generic) 3 x Per Week/30 Days Discharge Instructions: Use supplies as instructed; Kit contains: (15) Saline Bullets; (15) 3x3 Gauze; 15 pr Gloves Primary Dressing: Xeroform 4x4-HBD (in/in) (DME) (Dispense As Written) 3 x Per Week/30 Days Discharge Instructions: Apply Xeroform 4x4-HBD (in/in) as directed Secondary Dressing: ABD Pad 5x9 (in/in) (DME) (Generic) 3 x Per Week/30 Days Discharge Instructions: Cover with ABD pad Secondary Dressing: Gauze 3 x Per Week/30 Days Discharge Instructions: As directed: dry, moistened with saline or moistened with Dakins Solution Secured With: Medipore Tape - 42M Medipore H Soft Cloth Surgical Tape, 2x2 (in/yd) (DME) (Generic) 3 x Per Week/30 Days Secured With: Conform 4'' - Conforming Stretch Gauze Bandage 4x75 (in/in) (DME) (Generic) 3 x Per Week/30 Days Discharge Instructions: Apply as directed Secured With: Tubigrip Size D, 3x10 (in/yd) 3 x Per Week/30 Days Discharge Instructions: single layer 1. I would recommend currently that we go ahead and continue with the wound care measures as before and the patient is in agreement with the plan. This includes the use of the ABD pads or Zetuvit to cover over top of the primary dressing although right now the primary dressing I would recommend is actually that we utilize Xeroform gauze as the alginate just seems to be  sticking. 2. I am also can recommend that we have the patient continue with Tubigrip although I think size D is probably can be more appropriate based on JEANNIFER, DRAKEFORD ([id number]) what I am seeing currently. 3. I am also can recommend the patient should continue to monitor for any signs of worsening or infection. Obviously if anything changes she should let me know right now I do believe that the antibiotics have been of benefit. We will see patient back for reevaluation in 1 week here in the clinic. If anything worsens or changes patient will contact our office for additional recommendations. Electronic Signature(s) Signed: 07/27/2022 3:03:16 PM By: Worthy Keeler PA-C Entered By: Worthy Keeler on 07/27/2022 15:03:15 [name], [name] ([id number]) -------------------------------------------------------------------------------- SuperBill Details Patient Name: [name] Date of Service: 07/27/2022 Medical Record Number: [id number] Patient Account Number: [id number] Date of Birth/Sex: 06-Nov-1923 (88 y.[name]. F) Treating RN: Carlene Coria Primary Care Provider: Adrian Prows Other Clinician: Massie Kluver Referring Provider: Adrian Prows Treating Provider/Extender: Skipper Cliche in Treatment: 62 Diagnosis Coding ICD-10 Codes Code Description [phone] Non-pressure chronic ulcer of other part of left lower leg with other specified severity S80.12XD Contusion of left lower leg, subsequent encounter L89.623 Pressure ulcer of left heel, stage 3 I89.0 Lymphedema, not elsewhere classified I87.2 Venous insufficiency (chronic) (peripheral) Z95.0 Presence of cardiac pacemaker Z79.01 Long term (current) use of anticoagulants Facility Procedures CPT4 Code: [id number] Description: 62703 - DEB SUBQ TISSUE 20 SQ CM/< Modifier: Quantity: 1 CPT4 Code: Description: ICD-10 Diagnosis Description L97.828 Non-pressure chronic ulcer of other part of left lower leg with other spec  L89.623 Pressure ulcer of left heel, stage 3 Modifier: ified severity Quantity: CPT4 Code: [id number] Description: 29937 - DEB SUBQ TISS EA ADDL 20CM Modifier: Quantity: 1 CPT4 Code: Description: ICD-10 Diagnosis Description L97.828 Non-pressure chronic ulcer of other part of left  lower leg with other spec L89.623 Pressure ulcer of left heel, stage 3 Modifier: ified severity Quantity: Physician Procedures CPT4 Code: 5686168 Description: 37290 - WC PHYS SUBQ TISS 20 SQ CM Modifier: Quantity: 1 CPT4 Code: Description: ICD-10 Diagnosis Description L97.828 Non-pressure chronic ulcer of other part of left lower leg with other speci L89.623 Pressure ulcer of left heel, stage 3 Modifier: fied severity Quantity: CPT4 Code: 2111552 Description: 08022 - WC PHYS SUBQ TISS EA ADDL 20 CM Modifier: Quantity: 1 CPT4 Code: Description: ICD-10 Diagnosis Description L97.828 Non-pressure chronic ulcer of other part of left lower leg with other speci L89.623 Pressure ulcer of left heel, stage 3 Modifier: fied severity Quantity: Electronic Signature(s) Signed: 07/27/2022 3:04:01 PM By: Worthy Keeler PA-C Entered By: Worthy Keeler on 07/27/2022 15:04:00

## 2022-07-27 NOTE — Progress Notes (Signed)
REHANA, UNCAPHER ([id number]) Visit Report for 07/27/2022 Arrival Information Details Patient Name: [name], [name]. Date of Service: 07/27/2022 1:30 PM Medical Record Number: [id number] Patient Account Number: [id number] Date of Birth/Sex: [birth year] (88 y.o. F) Treating RN: Carlene Coria Primary Care Canary Fister: Adrian Prows Other Clinician: Massie Kluver Referring Emilynn Srinivasan: Adrian Prows Treating Murphy Duzan/Extender: Skipper Cliche in Treatment: 93 Visit Information History Since Last Visit All ordered tests and consults were completed: No Patient Arrived: [name] Added or deleted any medications: No Arrival Time: 13:53 Any new allergies or adverse reactions: No Transfer Assistance: None Had a fall or experienced change in No Patient Requires Transmission-Based No activities of daily living that may affect Precautions: risk of falls: Patient Has Alerts: Yes Hospitalized since last visit: No Patient Alerts: Patient on Blood Pain Present Now: No Thinner NOT DIABETIC ***ELIQUIS*** Electronic Signature(s) Signed: 07/27/2022 4:37:38 PM By: Massie Kluver Entered By: Massie Kluver on 07/27/2022 13:54:09 Gayler, [name] ([id number]) -------------------------------------------------------------------------------- Clinic Level of Care Assessment Details Patient Name: [name] Date of Service: 07/27/2022 1:30 PM Medical Record Number: [id number] Patient Account Number: [id number] Date of Birth/Sex: May 04, 1923 (88 y.o. F) Treating RN: Carlene Coria Primary Care Leila Schuff: Adrian Prows Other Clinician: Massie Kluver Referring Leray Garverick: Adrian Prows Treating Brent Taillon/Extender: Skipper Cliche in Treatment: 14 Clinic Level of Care Assessment Items TOOL 1 Quantity Score '[]'  - Use when EandM and Procedure is performed on INITIAL visit 0 ASSESSMENTS - Nursing Assessment / Reassessment '[]'  - General Physical Exam (combine w/ comprehensive assessment  (listed just below) when performed on new 0 pt. evals) '[]'  - 0 Comprehensive Assessment (HX, ROS, Risk Assessments, Wounds Hx, etc.) ASSESSMENTS - Wound and Skin Assessment / Reassessment '[]'  - Dermatologic / Skin Assessment (not related to wound area) 0 ASSESSMENTS - Ostomy and/or Continence Assessment and Care '[]'  - Incontinence Assessment and Management 0 '[]'  - 0 Ostomy Care Assessment and Management (repouching, etc.) PROCESS - Coordination of Care '[]'  - Simple Patient / Family Education for ongoing care 0 '[]'  - 0 Complex (extensive) Patient / Family Education for ongoing care '[]'  - 0 Staff obtains Programmer, systems, Records, Test Results / Process Orders '[]'  - 0 Staff telephones HHA, Nursing Homes / Clarify orders / etc '[]'  - 0 Routine Transfer to another Facility (non-emergent condition) '[]'  - 0 Routine Hospital Admission (non-emergent condition) '[]'  - 0 New Admissions / Biomedical engineer / Ordering NPWT, Apligraf, etc. '[]'  - 0 Emergency Hospital Admission (emergent condition) PROCESS - Special Needs '[]'  - Pediatric / Minor Patient Management 0 '[]'  - 0 Isolation Patient Management '[]'  - 0 Hearing / Language / Visual special needs '[]'  - 0 Assessment of Community assistance (transportation, D/C planning, etc.) '[]'  - 0 Additional assistance / Altered mentation '[]'  - 0 Support Surface(s) Assessment (bed, cushion, seat, etc.) INTERVENTIONS - Miscellaneous '[]'  - External ear exam 0 '[]'  - 0 Patient Transfer (multiple staff / Civil Service fast streamer / Similar devices) '[]'  - 0 Simple Staple / Suture removal (25 or less) '[]'  - 0 Complex Staple / Suture removal (26 or more) '[]'  - 0 Hypo/Hyperglycemic Management (do not check if billed separately) '[]'  - 0 Ankle / Brachial Index (ABI) - do not check if billed separately Has the patient been seen at the hospital within the last three years: Yes Total Score: 0 Level Of Care: ____ [name] ([id number]) Electronic Signature(s) Signed: 07/27/2022  4:37:38 PM By: Massie Kluver Entered By: Massie Kluver on 07/27/2022 14:51:40 Gauss, [name] ([id number]) -------------------------------------------------------------------------------- Encounter Discharge Information Details Patient Name: ZSWFUXNA,  [name]. Date of Service: 07/27/2022 1:30 PM Medical Record Number: [id number] Patient Account Number: [id number] Date of Birth/Sex: November 03, 1923 (88 y.o. F) Treating RN: Carlene Coria Primary Care Susan Arana: Adrian Prows Other Clinician: Massie Kluver Referring Cathey Fredenburg: Adrian Prows Treating Khyla Mccumbers/Extender: Skipper Cliche in Treatment: 51 Encounter Discharge Information Items Post Procedure Vitals Discharge Condition: Stable Temperature (F): 98.2 Ambulatory Status: Walker Pulse (bpm): 74 Discharge Destination: Home Respiratory Rate (breaths/min): 16 Transportation: Private Auto Blood Pressure (mmHg): 145/75 Accompanied By: daughter Schedule Follow-up Appointment: No Clinical Summary of Care: Electronic Signature(s) Signed: 07/27/2022 4:37:38 PM By: Massie Kluver Entered By: Massie Kluver on 07/27/2022 14:53:07 Yankee, [name] ([id number]) -------------------------------------------------------------------------------- Lower Extremity Assessment Details Patient Name: [name] Date of Service: 07/27/2022 1:30 PM Medical Record Number: [id number] Patient Account Number: [id number] Date of Birth/Sex: [birth year] (88 y.o. F) Treating RN: Carlene Coria Primary Care Prudencio Velazco: Adrian Prows Other Clinician: Massie Kluver Referring Joyann Spidle: Adrian Prows Treating Jiovany Scheffel/Extender: Skipper Cliche in Treatment: 62 Edema Assessment Assessed: [Left: Yes] [Right: No] Edema: [Left: Ye] [Right: s] Calf Left: Right: Point of Measurement: 33 cm From Medial Instep 28 cm Ankle Left: Right: Point of Measurement: 12 cm From Medial Instep 19.5 cm Vascular Assessment Pulses: Dorsalis  Pedis Palpable: [Left:Yes] Posterior Tibial Palpable: [Left:Yes] Electronic Signature(s) Signed: 07/27/2022 4:19:23 PM By: Carlene Coria RN Signed: 07/27/2022 4:37:38 PM By: Massie Kluver Entered By: Massie Kluver on 07/27/2022 14:09:30 [place], [name] ([id number]) -------------------------------------------------------------------------------- Multi Wound Chart Details Patient Name: [name] Date of Service: 07/27/2022 1:30 PM Medical Record Number: [id number] Patient Account Number: [id number] Date of Birth/Sex: Apr 07, 1923 (88 y.o. F) Treating RN: Carlene Coria Primary Care Sameeha Rockefeller: Adrian Prows Other Clinician: Massie Kluver Referring Yama Nielson: Adrian Prows Treating Ahnyla Mendel/Extender: Skipper Cliche in Treatment: 76 Vital Signs Height(in): 28 Pulse(bpm): 69 Weight(lbs): 150 Blood Pressure(mmHg): 145/75 Body Mass Index(BMI): 27.4 Temperature(F): 98.2 Respiratory Rate(breaths/min): 18 Photos: [N/A:N/A] Wound Location: Left Calcaneus Left, Lateral Ankle N/A Wounding Event: Pressure Injury Gradually Appeared N/A Primary Etiology: Pressure Ulcer Venous Leg Ulcer N/A Comorbid History: Cataracts, Congestive Heart Failure, Cataracts, Congestive Heart Failure, N/A Hypertension, Peripheral Arterial Hypertension, Peripheral Arterial Disease, Osteoarthritis, Neuropathy Disease, Osteoarthritis, Neuropathy Date Acquired: 07/20/2022 07/17/2022 N/A Weeks of Treatment: 1 1 N/A Wound Status: Open Open N/A Wound Recurrence: No No N/A Measurements L x W x D (cm) 5.5x2.5x0.2 6.2x2x0.1 N/A Area (cm) : 10.799 9.739 N/A Volume (cm) : 2.16 0.974 N/A % Reduction in Area: 65.60% -6103.20% N/A % Reduction in Volume: 31.30% -5987.50% N/A Classification: Category/Stage III Full Thickness Without Exposed N/A Support Structures Exudate Amount: Medium Medium N/A Exudate Type: Serosanguineous Serosanguineous N/A Exudate Color: red, brown red, brown N/A Granulation Amount:  None Present (0%) Medium (34-66%) N/A Granulation Quality: N/A Red N/A Necrotic Amount: Large (67-100%) Medium (34-66%) N/A Necrotic Tissue: Eschar, Adherent [place] N/A Exposed Structures: Fat Layer (Subcutaneous Tissue): Fat Layer (Subcutaneous Tissue): N/A Yes Yes Fascia: No Fascia: No Tendon: No Tendon: No Muscle: No Muscle: No Joint: No Joint: No Bone: No Bone: No Epithelialization: None None N/A Treatment Notes Electronic Signature(s) Signed: 07/27/2022 4:37:38 PM By: Massie Kluver Entered By: Massie Kluver on 07/27/2022 14:10:38 Mattie, [name] ([id number]) Joni Fears, [name] ([id number]) -------------------------------------------------------------------------------- Multi-Disciplinary Care Plan Details Patient Name: [name] Date of Service: 07/27/2022 1:30 PM Medical Record Number: [id number] Patient Account Number: [id number] Date of Birth/Sex: 1923/10/15 (88 y.o. F) Treating RN: Carlene Coria Primary Care Twanisha Foulk: Adrian Prows Other Clinician: Massie Kluver Referring Sharvi Mooneyhan: Adrian Prows Treating Jayleah Garbers/Extender: Skipper Cliche in  Treatment: 62 Active Inactive Electronic Signature(s) Signed: 07/27/2022 4:19:23 PM By: Carlene Coria RN Signed: 07/27/2022 4:37:38 PM By: Massie Kluver Entered By: Massie Kluver on 07/27/2022 14:09:38 Patrie, [name] ([id number]) -------------------------------------------------------------------------------- Pain Assessment Details Patient Name: [name] Date of Service: 07/27/2022 1:30 PM Medical Record Number: [id number] Patient Account Number: [id number] Date of Birth/Sex: 16-Dec-1922 (88 y.o. F) Treating RN: Carlene Coria Primary Care Electa Sterry: Adrian Prows Other Clinician: Massie Kluver Referring Dorraine Ellender: Adrian Prows Treating Uno Esau/Extender: Skipper Cliche in Treatment: 54 Active Problems Location of Pain Severity and Description of Pain Patient  Has Paino No Site Locations Pain Management and Medication Current Pain Management: Electronic Signature(s) Signed: 07/27/2022 4:19:23 PM By: Carlene Coria RN Signed: 07/27/2022 4:37:38 PM By: Massie Kluver Entered By: Massie Kluver on 07/27/2022 13:57:10 Bruneau, [name] ([id number]) -------------------------------------------------------------------------------- Patient/Caregiver Education Details Patient Name: [name] Date of Service: 07/27/2022 1:30 PM Medical Record Number: [id number] Patient Account Number: [id number] Date of Birth/Gender: 13-Dec-1922 (88 y.o. F) Treating RN: Carlene Coria Primary Care Physician: Adrian Prows Other Clinician: Massie Kluver Referring Physician: Adrian Prows Treating Physician/Extender: Skipper Cliche in Treatment: 54 Education Assessment Education Provided To: Patient Education Topics Provided Wound/Skin Impairment: Handouts: Other: continue wound care as directed Methods: Explain/Verbal Responses: State content correctly Electronic Signature(s) Signed: 07/27/2022 4:37:38 PM By: Massie Kluver Entered By: Massie Kluver on 07/27/2022 14:52:10 Perko, [name] ([id number]) -------------------------------------------------------------------------------- Wound Assessment Details Patient Name: [name] Date of Service: 07/27/2022 1:30 PM Medical Record Number: [id number] Patient Account Number: [id number] Date of Birth/Sex: 05-02-1923 (88 y.o. F) Treating RN: Carlene Coria Primary Care Jamiel Goncalves: Adrian Prows Other Clinician: Massie Kluver Referring Nikesha Kwasny: Adrian Prows Treating Aurel Nguyen/Extender: Skipper Cliche in Treatment: 62 Wound Status Wound Number: 11 Primary Pressure Ulcer Etiology: Wound Location: Left Calcaneus Wound Open Wounding Event: Pressure Injury Status: Date Acquired: 07/20/2022 Comorbid Cataracts, Congestive Heart Failure, Hypertension, Weeks Of Treatment:  1 History: Peripheral Arterial Disease, Osteoarthritis, Neuropathy Clustered Wound: No Photos Wound Measurements Length: (cm) 5.5 Width: (cm) 2.5 Depth: (cm) 0.2 Area: (cm) 10.799 Volume: (cm) 2.16 % Reduction in Area: 65.6% % Reduction in Volume: 31.3% Epithelialization: None Wound Description Classification: Category/Stage III Exudate Amount: Small Exudate Type: Serosanguineous Exudate Color: red, brown Foul Odor After Cleansing: No Slough/Fibrino Yes Wound Bed Granulation Amount: None Present (0%) Exposed Structure Necrotic Amount: Large (67-100%) Fascia Exposed: No Necrotic Quality: Eschar, Adherent Slough Fat Layer (Subcutaneous Tissue) Exposed: Yes Tendon Exposed: No Muscle Exposed: No Joint Exposed: No Bone Exposed: No Treatment Notes Wound #11 (Calcaneus) Wound Laterality: Left Cleanser Byram Ancillary Kit - 15 Day Supply Discharge Instruction: Use supplies as instructed; Kit contains: (15) Saline Bullets; (15) 3x3 Gauze; 15 pr Gloves Peri-Wound Care [place], [name] ([id number]) Topical Primary Dressing Xeroform 4x4-HBD (in/in) Discharge Instruction: Apply Xeroform 4x4-HBD (in/in) as directed Secondary Dressing ABD Pad 5x9 (in/in) Discharge Instruction: Cover with ABD pad Gauze Discharge Instruction: As directed: dry, moistened with saline or moistened with Dakins Solution Secured With Conform 4'' - Conforming Stretch Gauze Bandage 4x75 (in/in) Discharge Instruction: Apply as directed Tubigrip Size D, 3x10 (in/yd) Discharge Instruction: single layer Compression Wrap Compression Stockings Add-Ons Electronic Signature(s) Signed: 07/27/2022 4:19:23 PM By: Carlene Coria RN Signed: 07/27/2022 4:37:38 PM By: Massie Kluver Entered By: Massie Kluver on 07/27/2022 14:35:33 [place]co[place], [name] ([id number]) -------------------------------------------------------------------------------- Wound Assessment Details Patient Name: [name] Date of  Service: 07/27/2022 1:30 PM Medical Record Number: [id number] Patient Account Number: [id number] Date of Birth/Sex: [birth year] (88 y.o. F) Treating RN: Carlene Coria Primary  Care Kordae Buonocore: Adrian Prows Other Clinician: Massie Kluver Referring Legrande Hao: Adrian Prows Treating Tenzin Edelman/Extender: Skipper Cliche in Treatment: 62 Wound Status Wound Number: 12 Primary Venous Leg Ulcer Etiology: Wound Location: Left, Lateral Ankle Wound Open Wounding Event: Gradually Appeared Status: Date Acquired: 07/17/2022 Comorbid Cataracts, Congestive Heart Failure, Hypertension, Weeks Of Treatment: 1 History: Peripheral Arterial Disease, Osteoarthritis, Neuropathy Clustered Wound: No Photos Wound Measurements Length: (cm) 6.2 Width: (cm) 2 Depth: (cm) 0.1 Area: (cm) 9.739 Volume: (cm) 0.974 % Reduction in Area: -6103.2% % Reduction in Volume: -5987.5% Epithelialization: None Wound Description Classification: Full Thickness Without Exposed Support Structures Exudate Amount: Medium Exudate Type: Serosanguineous Exudate Color: red, brown Foul Odor After Cleansing: No Slough/Fibrino Yes Wound Bed Granulation Amount: Medium (34-66%) Exposed Structure Granulation Quality: Red Fascia Exposed: No Necrotic Amount: Medium (34-66%) Fat Layer (Subcutaneous Tissue) Exposed: Yes Necrotic Quality: Adherent Slough Tendon Exposed: No Muscle Exposed: No Joint Exposed: No Bone Exposed: No Treatment Notes Wound #12 (Ankle) Wound Laterality: Left, Lateral Cleanser Byram Ancillary Kit - 15 Day Supply Discharge Instruction: Use supplies as instructed; Kit contains: (15) Saline Bullets; (15) 3x3 Gauze; 15 pr Gloves Peri-Wound Care Menard, [name] ([id number]) Topical Primary Dressing Xeroform 4x4-HBD (in/in) Discharge Instruction: Apply Xeroform 4x4-HBD (in/in) as directed Secondary Dressing ABD Pad 5x9 (in/in) Discharge Instruction: Cover with ABD pad Gauze Discharge Instruction:  As directed: dry, moistened with saline or moistened with Dakins Solution Secured With Medipore Tape - 46M Medipore H Soft Cloth Surgical Tape, 2x2 (in/yd) Conform 4'' - Conforming Stretch Gauze Bandage 4x75 (in/in) Discharge Instruction: Apply as directed Tubigrip Size D, 3x10 (in/yd) Discharge Instruction: single layer Compression Wrap Compression Stockings Add-Ons Electronic Signature(s) Signed: 07/27/2022 4:19:23 PM By: Carlene Coria RN Signed: 07/27/2022 4:37:38 PM By: Massie Kluver Entered By: Massie Kluver on 07/27/2022 14:08:31 Plotkin, [name] ([id number]) -------------------------------------------------------------------------------- Vitals Details Patient Name: [name] Date of Service: 07/27/2022 1:30 PM Medical Record Number: [id number] Patient Account Number: [id number] Date of Birth/Sex: Jul 07, 1923 (88 y.o. F) Treating RN: Carlene Coria Primary Care Burman Bruington: Adrian Prows Other Clinician: Massie Kluver Referring Lorren Splawn: Adrian Prows Treating Aleanna Menge/Extender: Skipper Cliche in Treatment: 50 Vital Signs Time Taken: 13:56 Temperature (F): 98.2 Height (in): 62 Pulse (bpm): 74 Weight (lbs): 150 Respiratory Rate (breaths/min): 18 Body Mass Index (BMI): 27.4 Blood Pressure (mmHg): 145/75 Reference Range: 80 - 120 mg / dl Electronic Signature(s) Signed: 07/27/2022 4:37:38 PM By: Massie Kluver Entered By: Massie Kluver on 07/27/2022 13:56:45

## 2022-08-03 ENCOUNTER — Encounter: Payer: Medicare Other | Admitting: Physician Assistant

## 2022-08-03 DIAGNOSIS — L97828 Non-pressure chronic ulcer of other part of left lower leg with other specified severity: Secondary | ICD-10-CM | POA: Diagnosis not present

## 2022-08-03 NOTE — Progress Notes (Addendum)
ADELYNE, MARCHESE ([id number]) Visit Report for 08/03/2022 Chief Complaint Document Details Patient Name: [name], [name]. Date of Service: 08/03/2022 9:45 AM Medical Record Number: [id number] Patient Account Number: [id number] Date of Birth/Sex: December 26, 1922 (88 y.o. F) Treating RN: Cornell Barman Primary Care Provider: Adrian Prows Other Clinician: Massie Kluver Referring Provider: Adrian Prows Treating Provider/Extender: Skipper Cliche in Treatment: 42 Information Obtained from: Patient Chief Complaint Left leg ulcer and left heel blister/deep tissue injury Electronic Signature(s) Signed: 08/03/2022 10:26:02 AM By: Worthy Keeler PA-C Entered By: Worthy Keeler on 08/03/2022 10:26:01 [name], [name] ([id number]) -------------------------------------------------------------------------------- Debridement Details Patient Name: [name] Date of Service: 08/03/2022 9:45 AM Medical Record Number: [id number] Patient Account Number: [id number] Date of Birth/Sex: [birth year] (88 y.o. F) Treating RN: Cornell Barman Primary Care Provider: Adrian Prows Other Clinician: Massie Kluver Referring Provider: Adrian Prows Treating Provider/Extender: Skipper Cliche in Treatment: 63 Debridement Performed for Wound #11 Left Calcaneus Assessment: Performed By: Physician Tommie Sams., PA-C Debridement Type: Debridement Level of Consciousness (Pre- Awake and Alert procedure): Pre-procedure Verification/Time Out Yes - 10:29 Taken: Start Time: 10:29 Total Area Debrided (L x W): 4 (cm) x 2.4 (cm) = 9.6 (cm) Tissue and other material Viable, Non-Viable, Slough, Subcutaneous, Slough debrided: Level: Skin/Subcutaneous Tissue Debridement Description: Excisional Instrument: Curette Bleeding: Minimum Hemostasis Achieved: Pressure End Time: 10:30 Response to Treatment: Procedure was tolerated well Level of Consciousness (Post- Awake and  Alert procedure): Post Debridement Measurements of Total Wound Length: (cm) 4 Stage: Category/Stage III Width: (cm) 2.4 Depth: (cm) 0.1 Volume: (cm) 0.754 Character of Wound/Ulcer Post Debridement: Stable Post Procedure Diagnosis Same as Pre-procedure Electronic Signature(s) Signed: 08/03/2022 4:44:47 PM By: Massie Kluver Signed: 08/03/2022 5:40:29 PM By: Worthy Keeler PA-C Signed: 08/03/2022 6:20:35 PM By: Gretta Cool, BSN, RN, CWS, Kim RN, BSN Entered By: Massie Kluver on 08/03/2022 10:31:05 [name], [name] ([id number]) -------------------------------------------------------------------------------- Debridement Details Patient Name: [name] Date of Service: 08/03/2022 9:45 AM Medical Record Number: [id number] Patient Account Number: [id number] Date of Birth/Sex: [birth year] (88 y.o. F) Treating RN: Cornell Barman Primary Care Provider: Adrian Prows Other Clinician: Massie Kluver Referring Provider: Adrian Prows Treating Provider/Extender: Skipper Cliche in Treatment: 63 Debridement Performed for Wound #12 Left,Lateral Ankle Assessment: Performed By: Physician Tommie Sams., PA-C Debridement Type: Debridement Severity of Tissue Pre Debridement: Fat layer exposed Level of Consciousness (Pre- Awake and Alert procedure): Pre-procedure Verification/Time Out Yes - 10:31 Taken: Start Time: 10:31 Total Area Debrided (L x W): 5.5 (cm) x 4.5 (cm) = 24.75 (cm) Tissue and other material Viable, Non-Viable, Callus, Slough, Subcutaneous, Slough debrided: Level: Skin/Subcutaneous Tissue Debridement Description: Excisional Instrument: Curette Bleeding: Minimum Hemostasis Achieved: Pressure End Time: 10:34 Response to Treatment: Procedure was tolerated well Level of Consciousness (Post- Awake and Alert procedure): Post Debridement Measurements of Total Wound Length: (cm) 4.6 Width: (cm) 3.8 Depth: (cm) 0.2 Volume: (cm) 2.746 Character of Wound/Ulcer  Post Debridement: Stable Severity of Tissue Post Debridement: Fat layer exposed Post Procedure Diagnosis Same as Pre-procedure Electronic Signature(s) Signed: 08/03/2022 4:44:47 PM By: Massie Kluver Signed: 08/03/2022 5:40:29 PM By: Worthy Keeler PA-C Signed: 08/03/2022 6:20:35 PM By: Gretta Cool, BSN, RN, CWS, Kim RN, BSN Entered By: Massie Kluver on 08/03/2022 10:34:54 [name], [name] ([id number]) -------------------------------------------------------------------------------- HPI Details Patient Name: [name] Date of Service: 08/03/2022 9:45 AM Medical Record Number: [id number] Patient Account Number: [id number] Date of Birth/Sex: August 02, 1923 (88 y.o. 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 Dr. Amalia Hailey the podiatrist saw her on 07/20/2017 and sent her to the wound center for possible hyperbaric oxygen therapy. past medical history of peripheral vascular disease, varicose veins, status post appendectomy, basal cell carcinoma excision from the left leg, cholecystectomy, pacemaker placement, right lower extremity angiography done by Dr. dew in March 2017 with placement of a stent. there is also note of a successful ablation of the right small saphenous vein done which was reviewed by ultrasound on 10/24/2016. the patient had a right small saphenous vein ablation done on 10/20/2016. The patient has never been a smoker. She has been seen by Dr. Corene Cornea dew the vascular surgeon who most recently saw her on 06/15/2017 for evaluation of ongoing problems with right leg swelling. She had a lower  extremity arterial duplex examination done(02/13/17) which showed patent distal right superficial femoral artery stent and above-the-knee popliteal stent without evidence of restenosis. The ABI was more than 1.3 on the right and more than 1.3 on the left. This was consistent with noncompressible arteries due to medial calcification. The right great toe pressure and PPG waveforms are within normal limits and the left great toe pressure and PPG waveforms are decreased. he recommended she continue to wear her compression stockings and continue with elevation. She is scheduled to have a noninvasive arterial study in the near future 08/16/2017 -- had a lower extremity arterial duplex examination done which showed patent distal right superficial femoral artery stent and above-the-knee popliteal stent without evidence of restenosis. The ABI was more than 1.3 on the right and more than 1.3 on the left. This was consistent with noncompressible arteries due to medial calcification. The right great toe pressure and PPG waveforms are within normal limits and the left great toe pressure and PPG waveforms are decreased. the x-ray of the right ankle has not yet been done 08/24/2017 -- had a right ankle x-ray -- IMPRESSION:1. No fracture, bone lesion or evidence of osteomyelitis. 2. Lateral soft tissue swelling with a soft tissue ulcer. she has not yet seen the vascular surgeon for review 08/31/17 on evaluation today patient's wound appears to be showing signs of improvement. She still with her appointment with vascular in order to review her results of her vascular study and then determine if any intervention would be recommended at that time. No fevers, chills, nausea, or vomiting noted at this time. She has been tolerating the dressing changes without complication. 09/28/17 on evaluation today patient's wound appears to show signs of good improvement in regard to the granulation tissue which is surfacing. There  is still a layer of slough covering the wound and the posterior portion is still significantly deeper than the anterior nonetheless there has been some good sign of things moving towards the better. She is going to go back to Dr. dew for reevaluation to ensure her blood flow is still appropriate. That will be before her next evaluation with [place] next week. No fevers, chills, nausea, or vomiting noted at this time. Patient does have some discomfort rated to be a 3-4/10 depending on activity specifically cleansing the wound makes it worse. 10/05/2017 -- the patient was seen by Dr. Lucky Cowboy last week and noninvasive studies showed a normal right ABI with brisk triphasic waveforms consistent with  no arterial insufficiency including normal digital pressures. The duplex showed a patent distal right SFA stent and the proximal SFA was also normal. He was pleased with her test and thought she should have enough of perfusion for normal wound healing. He would see her back in 6 months time. 12/21/17 on evaluation today patient appears to be doing fairly well in regard to her right lateral ankle wound. Unfortunately the main issue that she is expansion at this point is that she is having some issues with what appears to be some cellulitis in the right anterior shin. She has also been noting a little bit of uncomfortable feeling especially last night and her ankle area. I'm afraid that she made the developing a little bit of an infection. With that being said I think it is in the early stages. 12/28/17 on evaluation today patient's ankle appears to be doing excellent. She's making good progress at this point the cellulitis seems to have improved after last week's evaluation. Overall she is having no significant discomfort which is excellent news. She does have an appointment with Dr. dew on March 29, 2018 for reevaluation in regard to the stent he placed. She seems to have excellent blood flow in the right lower  extremity. 01/19/12 on evaluation today patient's wound appears to be doing very well. In fact she does not appear to require debridement at this point, there's no evidence of infection, and overall from the standpoint of the wound she seems to be doing very well. With that being said I believe that it may be time to switch to different dressing away from the [facility] Dressing she tells me she does have a lot going on her friend actually passed away yesterday and she's also having a lot of issues with her husband this obviously is weighing heavy on her as far as your thoughts and concerns today. 01/25/18 on evaluation today patient appears to be doing fairly well in regard to her right lateral malleolus. She has been tolerating the dressing changes without complication. Overall I feel like this is definitely showing signs of improvement as far as how the overall appearance of the wound is there's also evidence of epithelium start to migrate over the granulation tissue. In general I think that she is progressing nicely as far as the wound is concerned. The only concern she really has is whether or not we can switch to every other week visits in order to avoid having as many appointments as her daughters have a difficult time getting her to her appointments as well as the patient's husband to his he is not doing very well at this point. 02/22/18 on evaluation today patient's right lateral malleolus ulcer appears to be doing great. She has been tolerating the dressing changes without complication. Overall you making excellent progress at this time. Patient is having no significant discomfort. [name], [name] ([id number]) 03/15/18 on evaluation today patient appears to be doing much more poorly in regard to her right lateral ankle ulcer at this point. Unfortunately since have last seen her her husband has passed just a few days ago is obviously weighed heavily on her her daughter also had surgery  well she is with her today as usual. There does not appear to be any evidence of infection she does seem to have significant contusion/deep tissue injury to the right lateral malleolus which was not noted previous when I saw her last. It's hard to tell of exactly when this injury occurred although during  the time she was spending the night in the hospital this may have been most likely. 03/22/18 on evaluation today patient appears to actually be doing very well in regard to her ulcer. She did unfortunately have a setback which was noted last week however the good news is we seem to be getting back on track and in fact the wound in the core did still have some necrotic tissue which will be addressed at this point today but in general I'm seeing signs that things are on the up and up. She is glad to hear this obviously she's been somewhat concerned that due to the how her wound digressed more recently. 03/29/18 on evaluation today patient appears to be doing fairly well in regard to her right lower extremity lateral malleolus ulcer. She unfortunately does have a new area of pressure injury over the inferior portion where the wound has opened up a little bit larger secondary to the pressure she seems to be getting. She does tell me sometimes when she sleeps at night that it actually hurts and does seem to be pushing on the area little bit more unfortunately. There does not appear to be any evidence of infection which is good news. She has been tolerating the dressing changes without complication. She also did have some bruising in the left second and third toes due to the fact that she may have bump this or injured it although she has neuropathy so she does not feel she did move recently that may have been where this came from. Nonetheless there does not appear to be any evidence of infection at this time. 04/12/18 on evaluation today patient's wound on the right lateral ankle actually appears to be doing a  little bit better with a lot of necrotic docking tissue centrally loosening up in clearing away. However she does have the beginnings of a deep tissue injury on the left lateral malleolus likely due to the fact we've been trying offload the right as much as we have. I think she may benefit from an assistive soft device to help with offloading and it looks like they're looking at one of the doughnut conditions that wraps around the lower leg to offload which I think will definitely do a good job. With that being said I think we definitely need to address this issue on the left before it becomes a wound. Patient is not having significant pain. 04/19/18 on evaluation today patient appears to be doing excellent in regard to the progress she's made with her right lateral ankle ulcer. The left ankle region which did show evidence of a deep tissue injury seems to be resolving there's little fluid noted underneath and a blister there's nothing open at this point in time overall I feel like this is progressing nicely which is good news. She does not seem to be having significant discomfort at this point which is also good news. 04/25/18-She is here in follow up evaluation for bilateral lateral malleolar ulcers. The right lateral malleolus ulcer with pale subcutaneous tissue exposure, central area of ulcer with tendon/periosteum exposed. The left lateral malleolus ulcer now with central area of nonviable tissue, otherwise deep tissue injury. She is wearing compression wraps to the left lower extremity, she will place the right lower extremity compression wraps on when she gets home. She will be out of town over the weekend and return next week and follow-up appointment. She completed her doxycycline this morning 05/03/18 on evaluation today patient appears to be doing  very well in regard to her right lateral ankle ulcer in general. At least she's showing some signs of improvement in this regard. Unfortunately she  has some additional injury to the left lateral malleolus region which appears to be new likely even over the past several days. Again this determination is based on the overall appearance. With that being said the patient is obviously frustrated about this currently. 05/10/18-She is here in follow-up evaluation for bilateral lateral malleolar ulcers. She states she has purchased offloading shoes/boots and they will arrive tomorrow. She was asked to bring them in the office at next week's appointment so her provider is aware of product being utilized. She continues to sleep on right or left side, she has been encouraged to sleep on her back. The right lateral malleolus ulcer is precariously close to peri-osteum; will order xray. The left lateral malleolus ulcer is improved. Will switch back to santyl; she will follow up next week. 05/17/18 on evaluation today patient actually appears to be doing very well in regard to her malleolus her ulcers compared to last time I saw them. She does not seem to have as much in the way of contusion at this point which is great news. With that being said she does continue to have discomfort and I do believe that she is still continuing to benefit from the offloading/pressure reducing boots that were recommended. I think this is the key to trying to get this to heal up completely. 05/24/18 on evaluation today patient actually appears to be doing worse at this point in time unfortunately compared to her last week's evaluation. She is having really no increased pain which is good news unfortunately she does have more maceration in your theme and noted surrounding the right lateral ankle the left lateral ankle is not really is erythematous I do not see signs of the overt cellulitis on that side. Unfortunately the wounds do not seem to have shown any signs of improvement since the last evaluation. She also has significant swelling especially on the right compared to previous  some of this may be due to infection however also think that she may be served better while she has these wounds by compression wrapping versus continuing to use the Juxta-Lite for the time being. Especially with the amount of drainage that she is experiencing at this point. No fevers, chills, nausea, or vomiting noted at this time. 05/31/18 on evaluation today patient appears to actually be doing better in regard to her right lateral lower extremity ulcer specifically on the malleolus region. She has been tolerating the antibiotic without complication. With that being said she still continues to have issues but a little bit of redness although nothing like she what she was experiencing previous. She still continues to pressure to her ankle area she did get the problem on offloading boots unfortunately she will not wear them she states there too uncomfortable and she can't get in and out of the bed. Nonetheless at this point her wounds seem to be continually getting worse which is not what we want I'm getting somewhat concerned about her progress and how things are going to proceed if we do not intervene in some way shape or form. I therefore had a very lengthy conversation today about offloading yet again and even made a specific suggestion for switching her to a memory foam mattress and even gave the information for a specific one that they could look at getting if it was something that they were interested  in considering. She does not want to be considered for a hospital bed air mattress although honestly insurance would not cover it that she does not have any wounds on her trunk. 06/14/18 on evaluation today both wounds over the bilateral lateral malleolus her ulcers appear to be doing better there's no evidence of pressure injury at this point. She did get the foam mattress for her bed and this does seem to have been extremely beneficial for her in my pinion. Her daughter states that she is having  difficulty getting out of bed because of how soft it is. The patient also relates this to be. Nonetheless I do feel like she's actually doing better. Unfortunately right after and around the time she was getting the mattress she also sustained a fall when she got up to go pick up the phone and ended up injuring her right elbow she has [address] sut[address]. We are not caring for this currently although home health is going to be taking the sutures out shortly. Nonetheless this may be something that we need to evaluate going forward. It depends on how well it has or has not healed in the end. She also recently saw an orthopedic specialist for an injection in the right shoulder just before her fall unfortunately the fall seems to have worsened her pain. 06/21/18 on evaluation today patient appears to be doing about the same in regard to her lateral malleolus ulcers. Both appear to be just a little bit deeper but again we are clinging away the necrotic and dead tissue which I think is why this is progressing towards a deeper realm as opposed [name], [name]. ([id number]) to improving from my measurement standpoint in that regard. Nonetheless she has been tolerating the dressing changes she absolutely hates the memory foam mattress topper that was obtained for her nonetheless I do believe this is still doing excellent as far as taking care of excess pressure in regard to the lateral malleolus regions. She in fact has no pressure injury that I see whereas in weeks past it was week by week I was constantly seeing new pressure injuries. Overall I think it has been very beneficial for her. 07/03/18; patient arrives in my clinic today. She has deep punched out areas over her bilateral lateral malleoli. The area on the right has some more depth. We spent a lot of time today talking about pressure relief for these areas. This started when her daughter asked for a prescription for a memory foam mattress. I have  never written a prescription for a mattress and I don't think insurances would pay for that on an ordinary bed. In any case he came up that she has foam boots that she refuses to wear. I would suggest going to these before any other offloading issues when she is in bed. They say she is meticulous about offloading this the rest of the day 07/10/18- She is seen in follow-up evaluation for bilateral, lateral malleolus ulcers. There is no improvement in the ulcers. She has purchased and is sleeping on a memory foam mattress/overlay, she has been using the offloading boots nightly over the past week. She has a follow up appointment with vascular medicine at the end of October, in my opinion this follow up should be expedited given her deterioration and suboptimal TBI results. We will order plain film xray of the left ankle as deeper structures are palpable; would consider having MRI, regardless of xray report(s). The ulcers will be treated with  iodoflex/iodosorb, she is unable to safely change the dressings daily with santyl. 07/19/18 on evaluation today patient appears to be doing in general visually well in regard to her bilateral lateral malleolus ulcers. She has been tolerating the dressing changes without complication which is good news. With that being said we did have an x-ray performed on 07/12/18 which revealed a slight loosen see in the lateral portion of the distal left fibula which may represent artifact but underline lytic destruction or osteomyelitis could not be excluded. MRI was recommended. With that being said we can see about getting the patient scheduled for an MRI to further evaluate this area. In fact we have that scheduled currently for August 20 19,019. 07/26/18 on evaluation today patient's wound on the right lateral ankle actually appears to be doing fairly well at this point in my pinion. She has made some good progress currently. With that being said unfortunately in regard to the left  lateral ankle ulcer this seems to be a little bit more problematic at this time. In fact as I further evaluated the situation she actually had bone exposed which is the first time that's been the case in the bone appear to be necrotic. Currently I did review patient's note from Dr. Bunnie Domino office with [facility]. He stated that ABI was 1.26 on the right and 0.95 on the left with good waveforms. Her perfusion is stable not reduced from previous studies and her digital waveforms were pretty good particularly on the right. His conclusion upon review of the note was that there was not much she could do to improve her perfusion and he felt she was adequate for wound healing. His suggestion was that she continued to see [place] and consider a synthetic skin graft if there was no underlying infection. He plans to see her back in six months or as needed. 08/01/18 on evaluation today patient appears to be doing better in regard to her right lateral ankle ulcer. Her left lateral ankle ulcer is about the same she still has bone involvement in evidence of necrosis. There does not appear to be evidence of infection at this time On the right lateral lower extremity. I have started her on the Augmentin she picked this up and started this yesterday. This is to get her through until she sees infectious disease which is scheduled for 08/12/18. 08/06/18 on evaluation today patient appears to be doing rather well considering my discussion with patient's daughter at the end of last week. The area which was marked where she had erythema seems to be improved and this is good news. With that being said overall the patient seems to be making good improvement when it comes to the overall appearance of the right lateral ankle ulcer although this has been slow she at least is coming around in this regard. Unfortunately in regard to the left lateral ankle ulcer this is osteomyelitis based on the pathology report as  well is bone culture. Nonetheless we are still waiting CT scan. Unfortunately the MRI we originally ordered cannot be performed as the patient is a pacemaker which I had overlooked. Nonetheless we are working on the CT scan approval and scheduling as of now. She did go to the hospital over the weekend and was placed on IV Cefzo for a couple of days. Fortunately this seems to have improved the erythema quite significantly which is good news. There does not appear to be any evidence of worsening infection at this time. She  did have some bleeding after the last debridement therefore I did not perform any sharp debridement in regard to left lateral ankle at this point. Patient has been approved for a snap vac for the right lateral ankle. 08/14/18; the patient with wounds over her bilateral lateral malleoli. The area on the right actually looks quite good. Been using a snap back on this area. Healthy granulation and appears to be filling in. Unfortunately the area on the left is really problematic. 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 Also noted to have cellulitis. She saw Dr. Novella Olive of infectious disease today and was put on linezolid. We are able to verify this with her pharmacy. She is completed the Augmentin that she was already on. We've been using Iodoflex to this area 08/23/18 on evaluation today patient's wounds both actually appear to be doing better compared to my prior evaluations. Fortunately she showing signs of good improvement in regard to the overall wound status especially where were using the snap vac on the right. In regard to left lateral malleolus the wound bed actually appears to be much cleaner than previously noted. I do not feel any phone directly probed during evaluation today and though there is tendon noted this does not appear to be necrotic it's actually fairly good as far as the overall  appearance of the tendon is concerned. In general the wound bed actually appears to be doing significantly better than it was previous. Patient is currently in the care of [name] and I did review that note today. He actually has her on two weeks of linezolid and then following the patient will be on 1-2 months of Keflex. That is the plan currently. She has been on antibiotics therapy as prescribed by myself initially starting on July 30, 2018 and has been on that continuously up to this point. 08/30/18 on evaluation today patient actually appears to be doing much better in regard to her right lateral malleolus ulcer. She has been tolerating the dressing changes specifically the snap vac without complication although she did have some issues with the seal currently. Apparently there was some trouble with getting it to maintain over the past week past Sunday. Nonetheless overall the wound appears better in regard to the right lateral malleolus region. In regard to left lateral malleolus this actually show some signs of additional granulation although there still tendon noted in the base of the wound this appears to be healthy not necrotic in any way whatsoever. We are considering potentially using a snap vac for the left lateral malleolus as well the product wrap from [employer], was present in the clinic today we're going to see this patient I did have her come in with me after obtaining consent from the patient and her daughter in order to look at the wound and see if there's any recommendation one way or another as to whether or not they felt the snapback could be beneficial for the left lateral malleolus region. But the conclusion was that it might be but that this is definitely a little bit deeper wound than what traditionally would be utilized for a snap vac. 09/06/18 on evaluation today patient actually appears to be doing excellent in my pinion in regard to both ankle ulcers. She has been  tolerating the dressing changes without complication which is great news. Specifically we have been using the snap vac. In regard to the right  ankle I'm not even sure that this is going to be necessary for today and following as the wound has filled in quite nicely. In regard to the left ankle I do believe [name], [name] ([id number]) that we're seeing excellent epithelialization from the edge as well as granulation in the central portion the tendon is still exposed but there's no evidence of necrotic bone and in general I feel like the patient has made excellent progress even compared to last week with just one week of the snap vac. 09/11/18; this is a patient who has wounds on her bilateral lateral malleoli. Initially both of these were deep stage IV wounds in the setting of chronic arterial insufficiency. She has been revascularized. As I understand think she been using snap vacs to both of these wounds however the area on the right became more superficial and currently she is only using it on the left. Using silver collagen on the right and silver collagen under the back on the left I believe 09/19/18 on evaluation today patient actually appears to be doing very well in regard to her lateral malleolus or ulcers bilaterally. She has been tolerating the dressing changes without complication. Fortunately there does not appear to be any evidence of infection at this time. Overall I feel like she is improving in an excellent manner and I'm very pleased with the fact that everything seems to be turning towards the better for her. This has obviously been a long road. 09/27/18 on evaluation today patient actually appears to be doing very well in regard to her bilateral lateral malleolus ulcers. She has been tolerating the dressing changes without complication. Fortunately there does not appear to be any evidence of infection at this time which is also great news. No fevers, chills, nausea, or vomiting  noted at this time. Overall I feel like she is doing excellent with the snap vac on the left malleolus. She had 40 mL of fluid collection over the past week. 10/04/18 on evaluation today patient actually appears to be doing well in regard to her bilateral lateral malleolus ulcers. She continues to tolerate the dressing changes without complication. One issue that I see is the snap vac on the left lateral malleolus which appears to have sealed off some fluid underlying this area and has not really allowed it to heal to the degree that I would like to see. For that reason I did suggest at this point we may want to pack a small piece of packing strip into this region to allow it to more effectively wick out fluid. 10/11/18 in general the patient today does not feel that she has been doing very well. She's been a little bit lethargic and subsequently is having bodyaches as well according to what she tells me today. With that being said overall she has been concerned with the fact that something may be worsening although to be honest her wounds really have not been appearing poorly. She does have a new ulcer on her left heel unfortunately. This may be pressure related. Nonetheless it seems to me to have potentially started at least as a blister I do not see any evidence of deep tissue injury. In regard to the left ankle the snap vac still seems to be causing the ceiling off of the deeper part of the wound which is in turn trapping fluid. I'm not extremely pleased with the overall appearance as far as progress from last week to this week therefore I'm gonna discontinue the snap  vac at this point. 10/18/18 patient unfortunately this point has not been feeling well for the past several days. She was seen by Grayland Ormond her primary care provider who is a Librarian, academic at [facility]. Subsequently she states that she's been very weak and generally feeling malaise. No fevers, chills, nausea, or  vomiting noted at this time. With that being said bloodwork was performed at the PCP office on the 11th of this month which showed a white blood cell count of 10.7. This was repeated today and shows a white blood cell count of 12.4. This does show signs of worsening. Coupled with the fact that she is feeling worse and that her left ankle wound is not really showing signs of improvement I feel like this is an indication that the osteomyelitis is likely exacerbating not improving. Overall I think we may also want to check her C-reactive protein and sedimentation rate. Actually did call [facility] office this afternoon while the patient was in the office here with me. Subsequently based on the findings we discussed treatment possibilities and I think that it is appropriate for [place] to go ahead and initiate treatment with doxycycline which I'm going to do. Subsequently he did agree to see about adding a CRP and sedimentation rate to her orders. If that has not already been drawn to where they can run it they will contact the patient she can come back to have that check. They are in agreement with plan as far as the patient and her daughter are concerned. Nonetheless also think we need to get in touch with Dr. Henreitta Leber office to see about getting the patient scheduled with him as soon as possible. 11/08/18 on evaluation today patient presents for follow-up concerning her bilateral foot and ankle ulcers. I did do an extensive review of her chart in epic today. Subsequently she was seen by [name] he did initiate Cefepime IV antibiotic therapy. Subsequently she had some issues with her PICC line this had to be removed because it was coiled and then replaced. Fortunately that was now settled. Unfortunately she has continued have issues with her left heel as well as the issues that she is experiencing with her bilateral lateral malleolus regions. I do believe however both areas seem to be doing a little bit  better on evaluation today which is good news. No fevers, chills, nausea, or vomiting noted at this time. She actually has an angiogram schedule with Dr. dew on this coming Monday, November 11, 2018. Subsequently the patient states that she is feeling much better especially than what she was roughly 2 weeks ago. She actually had to cancel an appointment because she was feeling so poorly. No fevers, chills, nausea, or vomiting noted at this time. 11/15/18 on evaluation today patient actually is status post having had her angiogram with Dr. dew Monday, four days ago. 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 He had to go and work on several areas of the vasculature fortunately he was able to obtain no more than a 30% residual stenosis throughout post procedure. I reviewed this note today. I think this will definitely help with healing at this time. Fortunately there does not appear to be any signs of infection and I do feel like ratio already has a better appearance to it. 11/22/18 upon evaluation today patient actually appears to be doing very well in regard to her wounds in general. The right lateral malleolus looks excellent the  heel looks better in the left lateral malleolus also appears to be doing a little better. With that being said the right second toe actually appears to be open and training we been watching this is been dry and stable but now is open. 12/03/2018 Seen today for follow-up and management of multiple bilateral lower extremity wounds. New pressure injury of the great toe which is closed at this time. Wound of the right distal second toe appears larger today with deep undermining and a pocket of fluid present within the undermining region. Left and right malleolus is wounds are stable today with no signs and symptoms of infection.Denies any needs or concerns during exam today. 12/13/18 on evaluation today patient appears to be doing somewhat better in  regard to her left heel ulcer. She also seems to be completely healed in regard to the right lateral malleolus ulcer. The left malleolus ulcer is smaller what unfortunately the wounds which are new over the first and second toes of the right foot are what are most concerning at this point especially the second. Both areas did require sharp debridement today. 12/20/18 on evaluation today patient's wound actually appears to be doing better in regard to left lateral ankle and her right lateral ankle continues to remain healed. The hill ulcer on the left is improved. She does have improvement noted as well in regard to both toe ulcers. Overall I'm very pleased in this regard. No fevers, chills, nausea, or vomiting noted at this time. 12/23/18 on evaluation today patient is seen after she had her toenails trimmed at the podiatrist office due to issues with her right great toe. There was what appeared to be dark eschar on the surface of the wound which had her in the podiatrist concerned. Nonetheless as I remember that during the last office visit I had utilize silver nitrate of this area I was much less concerned about the situation. Subsequently I was able to clean off much of this tissue without any complication today. This does not appear to show any signs of infection and actually look somewhat better [name], [name] ([id number]) compared to last time post debridement. Her second toe on the right foot actually had callous over and there did appear still be some fluid underneath this that would require debridement today. 12/27/18 on evaluation today patient actually appears to be showing signs of improvement at all locations. Even the left lateral ankle although this is not quite as great as the other sites. Fortunately there does not appear to be any signs of infection at this time and both of her toes on the right foot seem to be showing signs of improvement which is good news and very pleased in this  regard. 01/03/19 on evaluation today patient appears to be doing better for the most part in regard to her wounds in particular. There does not appear to be any evidence of infection at this time which is good news. Fortunately there is no sign of really worsening anywhere except for the right great toe which she does have what appears to be a bruise/deep tissue injury which is very superficial and already resolving. I'm not sure where this came from I questioned her extensively and she does not recall what may have happened with this. Other than that the patient seems to be doing well even the left lateral ankle ulcer looks good and is getting smaller. 01/10/19 on evaluation today patient appears to be doing well in regard to her left heel wound  and both of her toe wounds. Overall I feel like there is definitely improvement here and I'm happy in that regard. With that being said unfortunately she is having issues with the left lateral malleolus ulcer which unfortunately still has a lot of depth to it. This is gonna be a very difficult wound for [place] to be able to truly get to heal. I may want to consider some type of skin substitute to see if this would be of benefit for her. I'll discuss this with her more the next visit most likely. This was something I thought about more at the end of the visit when I was Artie out of the room and the patient had been discharged. 01/17/19 on evaluation today patient appears to be doing very well in regard to her wounds in general. She's been making excellent progress at this time. Fortunately there's no sign of infection at this time either. No fevers, chills, nausea, or vomiting noted at this time. The biggest issue is still her left lateral malleolus where it appears to be doing well and is getting smaller but still shows a small corner where this is deeper and goes down into what appears to be the joint space. Nonetheless this is taking much longer to heal although it  still looks better in smaller than previous evaluations. 01/24/19 on evaluation today patient's wounds actually appear to be doing rather well in general overall. She did require some sharp debridement in regard to the right great toe but everything else appears to be doing excellent no debridement was even necessary. No fevers, chills, nausea, or vomiting noted at this time. 01/31/19 on evaluation today patient actually appears to be doing much better in regard to her left foot wound on the heel as well as the ankle. The right great toe appears to be a little bit worse today this had callous over and trapped a lot of fluid underneath. Fortunately there's no signs of infection at any site which is great news. 02/07/19 on evaluation today patient actually appears to be doing decently well in regard to all of her ulcers at this point. No sharp debridement was required she is a little bit of hyper granulation in regard to the left lateral ankle as well as the left heel but the hill itself is almost completely healed which is excellent news. Overall been very pleased in this regard. 02/14/19 on evaluation today patient actually appears to be doing very well in regard to her ulcers on the right first toe, left lateral malleolus, and left heel. In fact the heel is almost completely healed at this point. The patient does not show any signs of infection which is good news. Overall very pleased with how things have progressed. 04/18/19 Telehealth Evaluation During the COVID-19 National Emergency: Verbal Consent: Obtained from patient Allergies: reviewed and the active list is current. Medication changes: patient has no current medication changes. COVID-19 Screening: 1. Have you traveled internationally or on a cruise ship in the last 14 dayso No 2. Have you had contact with someone with or under investigation for COVID-19o No 3. Have you had a fever, cough, sore throat, or experiencing shortness of breatho  No on evaluation today actually did have a visit with this patient through a telehealth encounter with her home health nurse. Subsequently it was noted that the patient actually appears to be doing okay in regard to her wounds both the right great toe as well as the left lateral malleolus have shown signs  of improvement although this in your theme around the left lateral malleolus there eschar coverings for both locations. The question is whether or not they are actually close and whether or not home health needs to discharge the patient or not. Nonetheless my concern is this point obviously is that without actually seeing her and being able to evaluate this directly I cannot ensure that she is completely healed which is the question that I'm being asked. 04/22/19 on evaluation today patient presents for her first evaluation since last time I saw her which was actually February 14, 2019. I did do a telehealth visit last week in which point it was questionable whether or not she may be healed and had to bring her in today for confirmation. With that being said she does seem to be doing quite well at this point which is good news. There does not appear to be any drainage in the deed I believe her wounds may be healed. Readmission: 09/04/2019 on evaluation today patient appears to be doing unfortunately somewhat more poorly in regard to her left foot ulcer secondary to a wound that began on 08/21/2019 at least when she first noticed this. Fortunately she has not had any evidence of active infection at this time. Systemically. I also do not necessarily see any evidence of infection at the blister/wound site on the first metatarsal head plantar aspect. This almost appears to be something that may have just rubbed inappropriately causing this to breakdown. They did not want a wait too long to come in to be seen as again she had significant issues in the past with wounds that took quite a while to heal in fact it  was close to 2 years. Nonetheless this does not appear to be quite that bad but again we do need to remove some of the necrotic tissue from the surface of the wound to tell exactly the extent. She does not appear to have any significant arterial disease at this point and again her last ABIs and TBI's are recorded above in the alert section her left ABI was 1.27 with a TBI of 0.72 to the right ABI 1.08 with a TBI of 0.39. Other than this the patient has been doing quite well since I last saw her and that was in May 2020. 09/11/2019 on evaluation today patient appeared to be doing very well with regard to her plantar foot ulcer on the left. In fact this appears to be almost completely healed which is awesome. That is after just 1 week of intervention. With that being said there is no signs of active infection at this time. [name], [name] ([id number]) 09/18/2019 on evaluation today patient actually appears to be doing excellent in fact she is completely healed based on what I am seeing at this point. Fortunately there is no signs of active infection at this time and overall patient is very pleased to hear that this area has healed so quickly. Readmission: 05/13/2021 upon evaluation today this patient presents for reevaluation here in the clinic. This is a wound that actually we previously took care of. She had 1 on the right ankle and the left the left turned out to be be harder due to to heal but nonetheless is doing great at this point as the right that has reopened and it was noted first just several weeks ago with a scab over it and came off in just the past few days. Fortunately there does not appear to be any obvious evidence  of significant active infection at this time which is great news. No fevers, chills, nausea, vomiting, or diarrhea. 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 There does not appear to be any signs of this interfering in any  way with her wound. She does have swelling we previously had compression socks for her ordered but again it does not look like she wears these on a regular basis by any means. 05/26/2021 upon evaluation today patient appears to be doing well with regard to her wound which is actually showing signs of excellent improvement. There does not appear to be any signs of active infection which is great news and overall very pleased with where things stand today. No fevers, chills, nausea, vomiting, or diarrhea. 06/02/2021 upon evaluation today patient's wound actually showing signs of excellent improvement. Fortunately there does not appear to be any signs of active infection which is great news. I think the patient is making good progress with regard to her wounds in general. 06/09/2021 upon evaluation today patient appears to be doing excellent in regard to her wounds currently. Fortunately there does not appear to be any signs of active infection which is great news. No fevers, chills, nausea, vomiting, or diarrhea. Overall extremely pleased with where things stand today. I think the patient is making excellent progress. 06/16/2021 upon evaluation today patient appears to be doing well in regard to her wound. This is going require little bit of debridement today and that was discussed with the patient. Otherwise she seems to be doing quite well and I am actually very pleased with where things stand at this point. No fevers, chills, nausea, vomiting, or diarrhea. 06/23/2021 upon evaluation today patient appears to be doing well with regard to her wounds. She has been tolerating the dressing changes without complication. Fortunately there does not appear to be any evidence of infection and she has not had air in her home which she actually lives at an assisted living that got fixed this morning. With that being said because of that her wrap has been extremely hot and bothersome for her over the past  week. 06/30/2021 upon evaluation today patient is actually making excellent progress in regard to her ankle ulcer. She has been tolerating the dressing changes without complication and overall extremely pleased with where things stand there does not appear to be any evidence of active infection which is great news. No fevers, chills, nausea, vomiting, or diarrhea. 07/07/21 upon evaluation today patients and culture on the right actually appears to be doing quite well. There does not appear to be any signs of infection and overall very pleased with where things stand today. No fevers, chills, nausea, or vomiting noted at this time. 07/14/2021 unfortunately the patient today has some evidence of deep tissue injury and pressure getting to the ankle region. Again I am not exactly sure what is going on here but this is very similar to issues that we have had in the past. I explained to the patient that she needs to be very mindful of exactly what is happening I think sleeping in bed is probably the main issue here although there could be other culprits I am not sure what else would potentially lead to this kind of a problem for her. 07/21/2021 upon evaluation today patient's wound actually showing signs of improvement compared to last week. Fortunately there does not appear to be any signs of active infection which is great news and overall  very pleased with where things stand in that regard. With that being said I do believe that she is continuing to show signs of overall of getting better although I think this is still basically about what we were 2 weeks ago due to the worsening and now improvement. 07/28/2021 upon evaluation today patient appears to be doing well with regard to her wound. She does have some slough buildup on the surface of the wound which I would have to manage today. Fortunately there is no sign of active infection at this time. No fevers, chills, nausea, vomiting, or diarrhea. 08/04/2021  upon evaluation today patient appears to be doing about the same in regard to her wound. To be perfectly honest I am beginning to be a little bit concerned about the overall appearance of the wound bed. I do think possibly taking a sample right around the margin of the wound could be beneficial for her as far as identifying anything such as an inflammatory process or to be honest even a skin tag cancer type process that may be of concern here. Fortunately there does not appear to be any evidence of active infection at this time which is great news she is not having any pain also great news. 08/11/2021 upon evaluation today patient appears to be doing well with regard to her wound. The good news is I did review her biopsy results and it showed some inflammatory mixed findings but nothing that appeared to be malignant which is great news. Overall this is more of a chronic venous stasis type issue which again is more what we have been treating. Nonetheless I just wanted to make sure before going forward that there was not anything more untoward going on at this point. 08/18/2021 upon evaluation today patient appears to be doing well with regard to her ankle ulcer. Fortunately there does not appear to be any signs of active infection at this time which is great overall wound is dramatically improved compared to last week. Since last week I have actually placed her on doxycycline and subsequently this is a good option as far as the findings are concerned at this point. 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 the doxycycline is doing that. There does not appear to be any evidence of active infection systemically which is great news. 08/25/2021 upon evaluation today patient appears to be doing well with regard to her wound. I feel like we are finally get back on track as far  as healing is concerned I am much happier with the overall appearance today. I do think that she is tolerating the dressing changes without complication which is great news. We have been using Hydrofera Blue which I think is a good option. The good news is she is also doing great in regard to her compression sock on the left which is a zipper compression that seems to be doing a great job keeping her edema under good control. 09/01/2021 upon evaluation today patient appears to be doing well with regard to her wound. Infection seems to be under much better control which is great news and very pleased in that regard. Fortunately there does not appear to be any signs of infection currently. AIBHLINN, KALMAR ([id number]) 09/08/2021 upon evaluation today patient actually appears to be making good progress in regard to her wound. She has been tolerating the  dressing changes without complication. Fortunately there does not appear to be any evidence of active infection at this time which is great news as well. No fevers, chills, nausea, vomiting, or diarrhea. 09/22/2021 upon evaluation today patient appears to be doing well with regard to her wound although is very slow to heal. We have not looked into Apligraf yet I think that is something that we should see about doing. 09/29/2021 upon evaluation today patient's wound is actually showing signs of doing about the same. I am not seeing any evidence of worsening but also no significant evidence of improvement. We did gain approval for the organogenesis products all except for Apligraf as covered by her insurance. With that being said I do think that we can go ahead and proceed with the NuShield if the patient and her family in agreement of the plan I discussed that with him today she does have a 20% coinsurance which we also discussed. 10/06/2021 upon evaluation today patient appears to unfortunately be doing a little bit worse she appears to be infected  based on what I am seeing. Fortunately there does not appear to be any signs of active infection at this time which is great news. Unfortunately it does appear to be some evidence of around the wound edge indicated by way of erythema and warmth as well as redness 10/13/2021 upon evaluation today patient actually appears to be doing excellent in regard to her ankle ulcer compared to what it was. 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 I think were fine to go ahead and apply the NuShield today. 10/20/2021 upon evaluation today patient appears to be doing excellent in fact the NuShield seems to have done all some for her thus far. I am actually very pleased with where we stand and overall I think that she is making great progress. There is no evidence of active infection at this time. 11/23; patient presents for follow-up. She has no issues or complaints today. She denies signs of infection. She reports stability in her wound healing. 11/03/2021 upon evaluation today patient appears to be doing well with regard to her wounds. She has been tolerating the dressing changes without complication. Fortunately there is no signs of active infection at this time. 11/10/2021 upon evaluation today patient appears to be doing well with regard to her right ankle which is actually showing signs of improvement with the NuShield I am very pleased. Subsequently in regards to the left ankle this appears to be doing okay with no evidence of issue here either. With that being said she has a significant contusion on the left leg from having fallen 2 days ago. She tells me that she was using her walker going to the closet and then when she got to the closet turned around to get something out at which point she fell according to  the story. Nonetheless she does have a hematoma just below her knee on the anterior portion of her shin. My hope is that this will not open until wound although we do need I think Some compression over it also think that we need to have her use an ice as well to help with the swelling and prevent this from getting worse. The last thing she needs is a wound opened up here. 11/17/2021 upon evaluation  patient's right ankle actually showing signs of improvement based on what I see currently there is a lot of new skin growth coming in which is great news. Nonetheless I do feel like that the patient is showing improvement as well in regard to her left leg with the use of the Tubigrip it swollen but not as bruised as what I would have expected after what I was seeing last week. Nonetheless I do think doubling up on the Tubigrip would probably be beneficial to try to keep some of the edema under better control here. 11/24/2021 upon evaluation today patient appears to be doing excellent in regard to her wound. This is actually measuring significantly smaller which is great news. Fortunately I do not see any signs of active infection locally nor systemically at this point. Overall I am very pleased with how the NuShield is doing. 12/30; wound bed looks healthy however not much change in overall wound volume. We applied Nushield again today in the standard fashion 12/08/2021 upon evaluation today patient's wound actually showing signs of good improvement and I am actually very pleased with where we stand today as well. I do not see any evidence of active infection locally nor systemically which is great news. Unfortunately she has been having some issues with stomach upset and diarrhea today. 12/15/2021 upon evaluation today patient appears to be doing excellent in regard to her wound. There is a little bit of dry skin raised up around the edges of the wound but fortunately nothing too significant at this point.  Fortunately I do not see any evidence of active infection either which is great news. No fevers, chills, nausea, vomiting, or diarrhea. 12/29/2021 upon evaluation today patient appears to be doing well with regard to her wound. In general I feel like she is getting very close to complete resolution. I think that she is making good progress here as well. Overall I do not see any signs of infection and very little of this appears to actually be open. 01/12/2022 upon evaluation today patient appears to be doing a little bit worse in regard to her wound. It appears that the collagen actually trapped fluid underneath the collagen which got hard and subsequently caused the fluid to back up. This patient has made the wound appear to be larger than what it was previous. With that being said I do not see any signs of obvious infection is not warm to touch and not significantly erythematous either which is good news. Nonetheless I do think that we will need to continue to keep an eye on this. Probably not go put on antibiotic this week but I will be too far from doing so she is actually on a Z-Pak right now I do not want to really double up on antibiotics. 01/26/2022 upon evaluation today patient's wound is showing signs of improvement although this is slow to turn back around. Fortunately there does not appear to be any evidence of active infection locally or systemically which is great news and overall I am extremely pleased with where we stand today. No fevers, chills, nausea, vomiting, or diarrhea. 02/02/2022 upon evaluation today patient appears to be doing well with regard to her wound. She has been tolerating the dressing changes without complication. Fortunately there does not appear to be any signs of active infection locally nor systemically at this time which is great news. No fevers, chills, nausea, vomiting, or diarrhea. 3/9; patient presents for follow-up. She has no issues or complaints today. She  denies signs of infection. 02/16/2022 upon evaluation today patient appears to be doing well with regard to her wound. She has been tolerating the dressing changes without complication. Fortunately I do not see any evidence of active infection locally nor systemically at this point which is great news. Overall I YANA, SCHORR ([id number]) think that the patient is making excellent progress in general. The wound is measuring smaller and though there does appear to be some need for sharp debridement today this is minimal compared to what we have noted previous. 02/23/2022 upon evaluation today patient appears to be doing well with regard to her wound she is definitely showing signs of improvement which is great news. 03/02/2022 upon evaluation today patient appears to be doing about the same in regard to her wound. Unfortunately were not seeing significant improvement. With that being said is also not significantly worse which is great news. No fevers, chills, nausea, vomiting, or diarrhea. 03-09-2022 upon evaluation today patient appears to be doing well with regard to her wound. She has been tolerating the dressing changes without complication. Fortunately there does not appear to be any signs of active infection locally or systemically which is great news. 03-16-2022 upon evaluation today patient appears to be doing well with regard to her wound this is measuring smaller and looking much better. I am actually very pleased with where we stand and I think that the patient is making great progress. I do not see any evidence of active infection locally or systemically which is great news. No fevers, chills, nausea, vomiting, or diarrhea. 03-23-2022 upon evaluation today patient appears to be doing better in regard to her wounds. In fact the wound area actually is showing signs of excellent improvement and actually very pleased with where we stand today. I do not see any evidence of active infection  locally nor systemically which is great news. 4/27; comes in today with again thick callus around the wound circumference which I removed with a curette. Some debris on the surface. Overall I do not think quite as good as it was last week by description. Using endoform 04-06-2022 upon evaluation today patient appears to be doing pretty well in regard to her wound. Fortunately I do not see any evidence of active infection locally or systemically which is great news and overall I am pleased in that regard. I do feel like this is measuring smaller postdebridement compared to where we were previous. Overall she is headed in the right direction. She does have an area that is threatening to open and looks a little irritated on the left ankle we did measure this just for the discolored area for now its not draining too much but we want to monitor and make sure nothing worsens here. 04-13-2022 upon evaluation today patient appears to be doing better in regard to her wound although this is still very slow to heal. We have reapplied for a skin substitute but have not heard anything back as of yet. Fortunately I do not see any evidence of active infection locally or systemically at this time which is great news. 04-20-2022 upon evaluation today patient's wound is actually showing signs of significant improvement which is great news. We did get approval for skin substitutes which I think is an option here for [place] but at the same time I am not even certain that this is going to be necessary especially considering how well things seem to be doing at this point. If she continues healing as we see  her right now but I think were probably going to be able to avoid any need for additional skin substitute. Patient and her daughter are both in agreement with that plan. With that being said she did have a fall she has several areas of contusion fortunately nothing that is can require intervention at this point but nonetheless  she did also fracture her rib which is extremely uncomfortable obviously. 04-27-2022 upon evaluation today patient appears to be doing well currently in regard to her wound on the right lateral ankle. Overall I feel like she is actually doing significantly better even compared to last week and very pleased in this regard. I think we are on the right track here. 05-04-2022 upon evaluation today patient appears to be doing well with regard to her wound. She is going require some sharp debridement today to clear away some of the necrotic debris. Fortunately I was able to do this quite easily without significant issue or breakdown here. There is a very small area of opening centrally but really this is pretty close to getting close. 05-11-2022 upon evaluation today patient's wound is actually showing signs of excellent improvement. I do not see any evidence of infection locally or systemically at this time. Overall I think she is very close to complete resolution with just a little bit of debridement to clear away some of the dry crusty area around the edges of the wound currently. 05-18-2022 upon evaluation patient's wound bed actually appears to be likely healed although I cannot be 100% sure I am extremely pleased however with where things stand today. I do not see any evidence of infection locally or systemically which is great news and overall I think you are on the right track here. 05-25-2022 upon evaluation today patient appears to be doing well with regard to her leg ulcer. Fortunately there does not appear to be any signs of active infection locally or systemically at this time which is great news. No fevers, chills, nausea, vomiting, or diarrhea. With that being said she does appear to be healed in regard to her right lateral ankle which is great news. In regard to her leg unfortunately during the removal of the compression wrap there was a small skin tear that occurred with the scissors.  Subsequently the patient does have a minimal amount of bleeding this is going require a couple Steri-Strips in order to seal this up. I do think it should likely heal quite well. Obviously this was unintentional. Nonetheless we do have to definitely monitor make sure that this completely heals up as well I am hopeful that we will be the case by next week to be honest. 06-01-2022 upon evaluation today patient appears to be doing excellent in regard to her wounds. Both areas are completely healed which is great news. Fortunately I do not see any signs of active infection locally or systemically at this time which is great and overall I am extremely pleased in that regard. No fevers, chills, nausea, vomiting, or diarrhea. 7/25; patient returns to clinic today with a story that 2 to 3 days ago she was sitting in her chair and she noticed bleeding from her left lower leg lateral aspect. She denies any trauma. She used pressure and a Band-Aid to get it to stop and she arrives in clinic today in follow-up. She was discharged on 6/29 apparently to stockings although she did not have any on and I do not think she was actually wearing them. She has significant chronic  venous insufficiency 07-04-2022 upon evaluation today patient presents for follow-up. In the clinic she actually was seen last week when I was on vacation due to an area on her leg which reopened unfortunately. With that being said she does seem to be doing a little bit better but unfortunately this is still giving her some trouble here. All of her other wounds are still closed which I was previously seen her for and that is great news. 07-13-2022 upon evaluation today patient appears to be doing well with regard to her wound everything appears to be healed unfortunately she has a completely new area on her left heel which is a blister. Fortunately I do not see any signs of infection but unfortunately I do think this is going to be ongoing issue at  this time. Question has been made whether or not we can get this to reabsorb or not. Again right now I am going to SOPHIE, TAMEZ. ([id number]) treat this more as a deep tissue injury currently. 07-20-2022 upon evaluation today patient's wound is actually showing signs of being somewhat worse I definitely think she is infected at this point. We will get a need to subsequently have her started on antibiotic as soon as possible. She voiced understanding and her daughter is in agreement with this plan. We were hoping that we would get the blister reabsorb that just did not happen. 07-27-2022 upon evaluation today patient's ankle ulcer unfortunately is not doing nearly as well as well would like to see. Fortunately there does not appear to be any signs of infection locally or systemically at this time which is great news and overall I am extremely pleased. With that being said unfortunately the patient continues to have issues with her left lateral ankle which is now open and subsequently the heel which again is of concern here. 08-03-2022 upon evaluation today patient appears to be doing better in regard to the heel though she is have some issues in regard to this left lateral ankle region. Unfortunately I think that this is just going to take some time I think we probably need to add Santyl to the regimen. Electronic Signature(s) Signed: 08/03/2022 11:11:32 AM By: Worthy Keeler PA-C Entered By: Worthy Keeler on 08/03/2022 11:11:31 Reiling, [name] ([id number]) -------------------------------------------------------------------------------- Physical Exam Details Patient Name: [name] Date of Service: 08/03/2022 9:45 AM Medical Record Number: [id number] Patient Account Number: [id number] Date of Birth/Sex: Apr 29, 1923 (88 y.o. F) Treating RN: Cornell Barman Primary Care Provider: Adrian Prows Other Clinician: Massie Kluver Referring Provider: Adrian Prows Treating  Provider/Extender: Skipper Cliche in Treatment: 70 Constitutional Well-nourished and well-hydrated in no acute distress. Respiratory normal breathing without difficulty. Psychiatric this patient is able to make decisions and demonstrates good insight into disease process. Alert and Oriented x 3. pleasant and cooperative. Notes Upon inspection patient's wounds did require sharp debridement not perform debridement at all locations today. Postdebridement wound bed appears to be doing significantly better which is great news. Electronic Signature(s) Signed: 08/03/2022 11:11:50 AM By: Worthy Keeler PA-C Entered By: Worthy Keeler on 08/03/2022 11:11:50 [name], [name] ([id number]) -------------------------------------------------------------------------------- Physician Orders Details Patient Name: [name] Date of Service: 08/03/2022 9:45 AM Medical Record Number: [id number] Patient Account Number: [id number] Date of Birth/Sex: [birth year] (88 y.o. 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 Bathing/ Shower/ Hygiene o May shower with wound dressing protected with water repellent cover or cast protector. o No tub bath. Anesthetic (Use 'Patient Medications' Section for Anesthetic Order Entry) o Lidocaine  applied to wound bed Edema Control - Lymphedema / Segmental Compressive Device / Other o Tubigrip single layer applied. - Tubi D single layer left lower leg o Elevate, Exercise Daily and Avoid Standing for Long Periods of Time. o Elevate legs to the level of the heart and pump ankles as often as possible o Elevate leg(s) parallel to the floor when sitting. Non-Wound Condition Left Lower Extremity o Cleanse affected area with antibacterial soap and water, Medications-Please add to medication list. o P.O. Antibiotics - continue Doxycycline Wound Treatment Wound #11 - Calcaneus Wound Laterality: Left Cleanser: Byram Ancillary Kit - 15 Day Supply (Generic) 3 x Per Week/30 Days Discharge Instructions: Use supplies as instructed; Kit contains: (15) Saline Bullets; (15) 3x3 Gauze; 15 pr Gloves Primary Dressing: Xeroform 4x4-HBD (in/in) 3 x Per Week/30 Days Discharge Instructions: Apply Xeroform 4x4-HBD (in/in) as directed Secondary Dressing: ABD Pad 5x9 (in/in) 3 x Per Week/30 Days Discharge Instructions: Cover with ABD pad Secondary Dressing: Gauze 3 x Per Week/30 Days Discharge Instructions: As directed: dry, moistened with saline or moistened with Dakins Solution Secured With: Conform 4'' - Conforming Stretch Gauze Bandage 4x75 (in/in) 3 x Per Week/30 Days Discharge Instructions: Apply as directed Secured With: Tubigrip Size D, 3x10 (in/yd) 3 x Per Week/30 Days Discharge Instructions: single layer [name], [name] ([id number]) Wound #12 - Ankle Wound Laterality: Left, Lateral Cleanser: Byram Ancillary Kit - 15 Day Supply (Generic) 3 x Per Week/30 Days Discharge Instructions: Use supplies as instructed; Kit contains: (15) Saline Bullets; (15) 3x3 Gauze; 15 pr Gloves Topical: Santyl Collagenase Ointment, 30 (gm), tube 3 x Per Week/30 Days Discharge Instructions: apply nickel thick to wound bed only Primary Dressing: Xeroform 4x4-HBD (in/in) (Dispense As Written) 3 x Per Week/30  Days Discharge Instructions: Apply Xeroform 4x4-HBD (in/in) as directed Secondary Dressing: ABD Pad 5x9 (in/in) (Generic) 3 x Per Week/30 Days Discharge Instructions: Cover with ABD pad Secondary Dressing: Gauze 3 x Per Week/30 Days Discharge Instructions: As directed: dry, moistened with saline or moistened with Dakins Solution Secured With: Medipore Tape - 66M Medipore H Soft Cloth Surgical Tape, 2x2 (in/yd) (Generic) 3 x Per Week/30 Days Secured With: Conform 4'' - Conforming Stretch Gauze Bandage 4x75 (in/in) (Generic) 3 x Per Week/30 Days Discharge Instructions: Apply as directed Secured With: Tubigrip Size D, 3x10 (in/yd) 3 x Per Week/30 Days Discharge Instructions: single layer Patient Medications Allergies: Sulfa (Sulfonamide Antibiotics), metronidazole, monistat Notifications Medication Indication Start End Santyl 08/03/2022 DOSE topical 250 unit/gram ointment - ointment topical Apply nickel thick daily to the wound bed and then cover with a dressing as directed in clinic x 30 days doxycycline hyclate 08/03/2022 DOSE 1 - oral 100 mg capsule - 1 capsule oral taken 2 times per day for 14 days Electronic Signature(s) Signed: 08/03/2022 10:42:39 AM By: Worthy Keeler PA-C Entered By: Worthy Keeler on 08/03/2022 10:42:39 [place], [name] ([id number]) -------------------------------------------------------------------------------- Problem List Details Patient Name: [name] Date of  Service: 08/03/2022 9:45 AM Medical Record Number: [id number] Patient Account Number: [id number] Date of Birth/Sex: 1923-01-21 (88 y.o. F) Treating RN: Cornell Barman Primary Care Provider: Adrian Prows Other Clinician: Massie Kluver Referring Provider: Adrian Prows Treating Provider/Extender: Skipper Cliche in Treatment: 69 Active Problems ICD-10 Encounter Code Description Active Date MDM Diagnosis L97.828 Non-pressure chronic ulcer of other part of left lower leg with other  06/27/2022 No Yes specified severity S80.12XD Contusion of left lower leg, subsequent encounter 06/27/2022 No Yes L89.623 Pressure ulcer of left heel, stage 3 07/13/2022 No Yes I89.0 Lymphedema, not elsewhere classified 05/13/2021 No Yes I87.2 Venous insufficiency (chronic) (peripheral) 05/13/2021 No Yes Z95.0 Presence of cardiac pacemaker 05/13/2021 No Yes Z79.01 Long term (current) use of anticoagulants 05/13/2021 No Yes Inactive Problems ICD-10 Code Description Active Date Inactive Date 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, stage 3 05/13/2021 05/13/2021 S81.811A Laceration without foreign body, right lower leg, initial encounter 05/25/2022 05/25/2022 S80.12XA Contusion of left lower leg, initial encounter 11/10/2021 11/10/2021 Resolved Problems Electronic Signature(s) Signed: 08/03/2022 10:25:52 AM By: Worthy Keeler PA-C Entered By: Worthy Keeler on 08/03/2022 10:25:52 [name], [name] ([id number]) Joni Fears, [name] ([id number]) -------------------------------------------------------------------------------- Progress Note Details Patient Name: [name] Date of Service: 08/03/2022 9:45 AM Medical Record Number: [id number] Patient Account Number: [id number] Date of Birth/Sex: 30-Jun-1923 (88 y.o. 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 Dr. Amalia Hailey the podiatrist saw her on 07/20/2017 and sent her to the wound center for possible hyperbaric oxygen therapy. past medical history of  peripheral vascular disease, varicose veins, status post appendectomy, basal cell carcinoma excision from the left leg, cholecystectomy, pacemaker placement, right lower extremity angiography done by Dr. dew in March 2017 with placement of a stent. there is also note of a successful ablation of the right small saphenous vein done which was reviewed by ultrasound on 10/24/2016. the patient had a right small saphenous vein ablation done on 10/20/2016. The patient has never been a smoker. She has been seen by Dr. Corene Cornea dew the vascular surgeon who most recently saw her on 06/15/2017 for evaluation of ongoing problems with right leg swelling. She had a lower extremity arterial duplex examination done(02/13/17) which showed patent distal right superficial femoral artery stent and above-the-knee popliteal stent without evidence of restenosis. The ABI was more than 1.3 on the right and more than 1.3 on the left. This was consistent with noncompressible arteries due to medial calcification. The right great toe pressure and PPG waveforms are within normal limits and the left great toe pressure and PPG waveforms are decreased. he recommended she continue to wear her compression stockings and continue with elevation. She is scheduled to have a noninvasive arterial study in the near future 08/16/2017 -- had a lower extremity arterial duplex examination done which showed patent distal right superficial femoral artery stent and above-the-knee popliteal stent without evidence of restenosis. The ABI was more than 1.3 on the right and more than 1.3 on the left. This was consistent with noncompressible arteries due to medial calcification. The right great toe pressure and PPG waveforms are within normal limits and the  left great toe pressure and PPG waveforms are decreased. the x-ray of the right ankle has not yet been done 08/24/2017 -- had a right ankle x-ray -- IMPRESSION:1. No fracture, bone lesion or evidence of  osteomyelitis. 2. Lateral soft tissue swelling with a soft tissue ulcer. she has not yet seen the vascular surgeon for review 08/31/17 on evaluation today patient's wound appears to be showing signs of improvement. She still with her appointment with vascular in order to review her results of her vascular study and then determine if any intervention would be recommended at that time. No fevers, chills, nausea, or vomiting noted at this time. She has been tolerating the dressing changes without complication. 09/28/17 on evaluation today patient's wound appears to show signs of good improvement in regard to the granulation tissue which is surfacing. There is still a layer of slough covering the wound and the posterior portion is still significantly deeper than the anterior nonetheless there has been some good sign of things moving towards the better. She is going to go back to Dr. dew for reevaluation to ensure her blood flow is still appropriate. That will be before her next evaluation with [place] next week. No fevers, chills, nausea, or vomiting noted at this time. Patient does have some discomfort rated to be a 3-4/10 depending on activity specifically cleansing the wound makes it worse. 10/05/2017 -- the patient was seen by Dr. Lucky Cowboy last week and noninvasive studies showed a normal right ABI with brisk triphasic waveforms consistent with no arterial insufficiency including normal digital pressures. The duplex showed a patent distal right SFA stent and the proximal SFA was also normal. He was pleased with her test and thought she should have enough of perfusion for normal wound healing. He would see her back in 6 months time. 12/21/17 on evaluation today patient appears to be doing fairly well in regard to her right lateral ankle wound. Unfortunately the main issue that she is expansion at this point is that she is having some issues with what appears to be some cellulitis in the right anterior shin. She  has also been noting a little bit of uncomfortable feeling especially last night and her ankle area. I'm afraid that she made the developing a little bit of an infection. With that being said I think it is in the early stages. 12/28/17 on evaluation today patient's ankle appears to be doing excellent. She's making good progress at this point the cellulitis seems to have improved after last week's evaluation. Overall she is having no significant discomfort which is excellent news. She does have an appointment with Dr. dew on March 29, 2018 for reevaluation in regard to the stent he placed. She seems to have excellent blood flow in the right lower extremity. 01/19/12 on evaluation today patient's wound appears to be doing very well. In fact she does not appear to require debridement at this point, there's no evidence of infection, and overall from the standpoint of the wound she seems to be doing very well. With that being said I believe that it may be time to switch to different dressing away from the [facility] Dressing she tells me she does have a lot going on her friend actually passed away yesterday and she's also having a lot of issues with her husband this obviously is weighing heavy on her as far as your thoughts and concerns today. 01/25/18 on evaluation today patient appears to be doing fairly well in regard to her right lateral  malleolus. She has been tolerating the dressing changes without complication. Overall I feel like this is definitely showing signs of improvement as far as how the overall appearance of the wound is there's also evidence of epithelium start to migrate over the granulation tissue. In general I think that she is progressing nicely as far as the wound is concerned. The only concern she really has is whether or not we can switch to every other week visits in order to avoid having as many NIKIE, CID ([id number]) appointments as her daughters have a difficult time  getting her to her appointments as well as the patient's husband to his he is not doing very well at this point. 02/22/18 on evaluation today patient's right lateral malleolus ulcer appears to be doing great. She has been tolerating the dressing changes without complication. Overall you making excellent progress at this time. Patient is having no significant discomfort. 03/15/18 on evaluation today patient appears to be doing much more poorly in regard to her right lateral ankle ulcer at this point. Unfortunately since have last seen her her husband has passed just a few days ago is obviously weighed heavily on her her daughter also had surgery well she is with her today as usual. There does not appear to be any evidence of infection she does seem to have significant contusion/deep tissue injury to the right lateral malleolus which was not noted previous when I saw her last. It's hard to tell of exactly when this injury occurred although during the time she was spending the night in the hospital this may have been most likely. 03/22/18 on evaluation today patient appears to actually be doing very well in regard to her ulcer. She did unfortunately have a setback which was noted last week however the good news is we seem to be getting back on track and in fact the wound in the core did still have some necrotic tissue which will be addressed at this point today but in general I'm seeing signs that things are on the up and up. She is glad to hear this obviously she's been somewhat concerned that due to the how her wound digressed more recently. 03/29/18 on evaluation today patient appears to be doing fairly well in regard to her right lower extremity lateral malleolus ulcer. She unfortunately does have a new area of pressure injury over the inferior portion where the wound has opened up a little bit larger secondary to the pressure she seems to be getting. She does tell me sometimes when she sleeps at night  that it actually hurts and does seem to be pushing on the area little bit more unfortunately. There does not appear to be any evidence of infection which is good news. She has been tolerating the dressing changes without complication. She also did have some bruising in the left second and third toes due to the fact that she may have bump this or injured it although she has neuropathy so she does not feel she did move recently that may have been where this came from. Nonetheless there does not appear to be any evidence of infection at this time. 04/12/18 on evaluation today patient's wound on the right lateral ankle actually appears to be doing a little bit better with a lot of necrotic docking tissue centrally loosening up in clearing away. However she does have the beginnings of a deep tissue injury on the left lateral malleolus likely due to the fact we've been trying offload the right  as much as we have. I think she may benefit from an assistive soft device to help with offloading and it looks like they're looking at one of the doughnut conditions that wraps around the lower leg to offload which I think will definitely do a good job. With that being said I think we definitely need to address this issue on the left before it becomes a wound. Patient is not having significant pain. 04/19/18 on evaluation today patient appears to be doing excellent in regard to the progress she's made with her right lateral ankle ulcer. The left ankle region which did show evidence of a deep tissue injury seems to be resolving there's little fluid noted underneath and a blister there's nothing open at this point in time overall I feel like this is progressing nicely which is good news. She does not seem to be having significant discomfort at this point which is also good news. 04/25/18-She is here in follow up evaluation for bilateral lateral malleolar ulcers. The right lateral malleolus ulcer with pale subcutaneous  tissue exposure, central area of ulcer with tendon/periosteum exposed. The left lateral malleolus ulcer now with central area of nonviable tissue, otherwise deep tissue injury. She is wearing compression wraps to the left lower extremity, she will place the right lower extremity compression wraps on when she gets home. She will be out of town over the weekend and return next week and follow-up appointment. She completed her doxycycline this morning 05/03/18 on evaluation today patient appears to be doing very well in regard to her right lateral ankle ulcer in general. At least she's showing some signs of improvement in this regard. Unfortunately she has some additional injury to the left lateral malleolus region which appears to be new likely even over the past several days. Again this determination is based on the overall appearance. With that being said the patient is obviously frustrated about this currently. 05/10/18-She is here in follow-up evaluation for bilateral lateral malleolar ulcers. She states she has purchased offloading shoes/boots and they will arrive tomorrow. She was asked to bring them in the office at next week's appointment so her provider is aware of product being utilized. She continues to sleep on right or left side, she has been encouraged to sleep on her back. The right lateral malleolus ulcer is precariously close to peri-osteum; will order xray. The left lateral malleolus ulcer is improved. Will switch back to santyl; she will follow up next week. 05/17/18 on evaluation today patient actually appears to be doing very well in regard to her malleolus her ulcers compared to last time I saw them. She does not seem to have as much in the way of contusion at this point which is great news. With that being said she does continue to have discomfort and I do believe that she is still continuing to benefit from the offloading/pressure reducing boots that were recommended. I think this  is the key to trying to get this to heal up completely. 05/24/18 on evaluation today patient actually appears to be doing worse at this point in time unfortunately compared to her last week's evaluation. She is having really no increased pain which is good news unfortunately she does have more maceration in your theme and noted surrounding the right lateral ankle the left lateral ankle is not really is erythematous I do not see signs of the overt cellulitis on that side. Unfortunately the wounds do not seem to have shown any signs of improvement since the  last evaluation. She also has significant swelling especially on the right compared to previous some of this may be due to infection however also think that she may be served better while she has these wounds by compression wrapping versus continuing to use the Juxta-Lite for the time being. Especially with the amount of drainage that she is experiencing at this point. No fevers, chills, nausea, or vomiting noted at this time. 05/31/18 on evaluation today patient appears to actually be doing better in regard to her right lateral lower extremity ulcer specifically on the malleolus region. She has been tolerating the antibiotic without complication. With that being said she still continues to have issues but a little bit of redness although nothing like she what she was experiencing previous. She still continues to pressure to her ankle area she did get the problem on offloading boots unfortunately she will not wear them she states there too uncomfortable and she can't get in and out of the bed. Nonetheless at this point her wounds seem to be continually getting worse which is not what we want I'm getting somewhat concerned about her progress and how things are going to proceed if we do not intervene in some way shape or form. I therefore had a very lengthy conversation today about offloading yet again and even made a specific suggestion for switching her  to a memory foam mattress and even gave the information for a specific one that they could look at getting if it was something that they were interested in considering. She does not want to be considered for a hospital bed air mattress although honestly insurance would not cover it that she does not have any wounds on her trunk. 06/14/18 on evaluation today both wounds over the bilateral lateral malleolus her ulcers appear to be doing better there's no evidence of pressure injury at this point. She did get the foam mattress for her bed and this does seem to have been extremely beneficial for her in my pinion. Her daughter states that she is having difficulty getting out of bed because of how soft it is. The patient also relates this to be. Nonetheless I do feel like she's actually doing better. Unfortunately right after and around the time she was getting the mattress she also sustained a fall when she got up to go pick up the phone and ended up injuring her right elbow she has [address] sut[address] place. We are not caring for this currently although home health is going to be taking the sutures out shortly. Nonetheless this may be something that we need to evaluate going forward. It depends on [name], [name] ([id number]) how well it has or has not healed in the end. She also recently saw an orthopedic specialist for an injection in the right shoulder just before her fall unfortunately the fall seems to have worsened her pain. 06/21/18 on evaluation today patient appears to be doing about the same in regard to her lateral malleolus ulcers. Both appear to be just a little bit deeper but again we are clinging away the necrotic and dead tissue which I think is why this is progressing towards a deeper realm as opposed to improving from my measurement standpoint in that regard. Nonetheless she has been tolerating the dressing changes she absolutely hates the memory foam mattress topper that was obtained for her  nonetheless I do believe this is still doing excellent as far as taking care of excess pressure in regard to the lateral malleolus  regions. She in fact has no pressure injury that I see whereas in weeks past it was week by week I was constantly seeing new pressure injuries. Overall I think it has been very beneficial for her. 07/03/18; patient arrives in my clinic today. She has deep punched out areas over her bilateral lateral malleoli. The area on the right has some more depth. We spent a lot of time today talking about pressure relief for these areas. This started when her daughter asked for a prescription for a memory foam mattress. I have never written a prescription for a mattress and I don't think insurances would pay for that on an ordinary bed. In any case he came up that she has foam boots that she refuses to wear. I would suggest going to these before any other offloading issues when she is in bed. They say she is meticulous about offloading this the rest of the day 07/10/18- She is seen in follow-up evaluation for bilateral, lateral malleolus ulcers. There is no improvement in the ulcers. She has purchased and is sleeping on a memory foam mattress/overlay, she has been using the offloading boots nightly over the past week. She has a follow up appointment with vascular medicine at the end of October, in my opinion this follow up should be expedited given her deterioration and suboptimal TBI results. We will order plain film xray of the left ankle as deeper structures are palpable; would consider having MRI, regardless of xray report(s). The ulcers will be treated with iodoflex/iodosorb, she is unable to safely change the dressings daily with santyl. 07/19/18 on evaluation today patient appears to be doing in general visually well in regard to her bilateral lateral malleolus ulcers. She has been tolerating the dressing changes without complication which is good news. With that being said we did  have an x-ray performed on 07/12/18 which revealed a slight loosen see in the lateral portion of the distal left fibula which may represent artifact but underline lytic destruction or osteomyelitis could not be excluded. MRI was recommended. With that being said we can see about getting the patient scheduled for an MRI to further evaluate this area. In fact we have that scheduled currently for August 20 19,019. 07/26/18 on evaluation today patient's wound on the right lateral ankle actually appears to be doing fairly well at this point in my pinion. She has made some good progress currently. With that being said unfortunately in regard to the left lateral ankle ulcer this seems to be a little bit more problematic at this time. In fact as I further evaluated the situation she actually had bone exposed which is the first time that's been the case in the bone appear to be necrotic. Currently I did review patient's note from Dr. Bunnie Domino office with [facility]. He stated that ABI was 1.26 on the right and 0.95 on the left with good waveforms. Her perfusion is stable not reduced from previous studies and her digital waveforms were pretty good particularly on the right. His conclusion upon review of the note was that there was not much she could do to improve her perfusion and he felt she was adequate for wound healing. His suggestion was that she continued to see [place] and consider a synthetic skin graft if there was no underlying infection. He plans to see her back in six months or as needed. 08/01/18 on evaluation today patient appears to be doing better in regard to her right lateral ankle ulcer.  Her left lateral ankle ulcer is about the same she still has bone involvement in evidence of necrosis. There does not appear to be evidence of infection at this time On the right lateral lower extremity. I have started her on the Augmentin she picked this up and started this yesterday. This is to  get her through until she sees infectious disease which is scheduled for 08/12/18. 08/06/18 on evaluation today patient appears to be doing rather well considering my discussion with patient's daughter at the end of last week. The area which was marked where she had erythema seems to be improved and this is good news. With that being said overall the patient seems to be making good improvement when it comes to the overall appearance of the right lateral ankle ulcer although this has been slow she at least is coming around in this regard. Unfortunately in regard to the left lateral ankle ulcer this is osteomyelitis based on the pathology report as well is bone culture. Nonetheless we are still waiting CT scan. Unfortunately the MRI we originally ordered cannot be performed as the patient is a pacemaker which I had overlooked. Nonetheless we are working on the CT scan approval and scheduling as of now. She did go to the hospital over the weekend and was placed on IV Cefzo for a couple of days. Fortunately this seems to have improved the erythema quite significantly which is good news. There does not appear to be any evidence of worsening infection at this time. She did have some bleeding after the last debridement therefore I did not perform any sharp debridement in regard to left lateral ankle at this point. Patient has been approved for a snap vac for the right lateral ankle. 08/14/18; the patient with wounds over her bilateral lateral malleoli. The area on the right actually looks quite good. Been using a snap back on this area. Healthy granulation and appears to be filling in. Unfortunately the area on the left is really problematic. 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 Also noted to have cellulitis. She saw Dr. Novella Olive of infectious disease today and was put on linezolid. We are able to verify this with her pharmacy.  She is completed the Augmentin that she was already on. We've been using Iodoflex to this area 08/23/18 on evaluation today patient's wounds both actually appear to be doing better compared to my prior evaluations. Fortunately she showing signs of good improvement in regard to the overall wound status especially where were using the snap vac on the right. In regard to left lateral malleolus the wound bed actually appears to be much cleaner than previously noted. I do not feel any phone directly probed during evaluation today and though there is tendon noted this does not appear to be necrotic it's actually fairly good as far as the overall appearance of the tendon is concerned. In general the wound bed actually appears to be doing significantly better than it was previous. Patient is currently in the care of [name] and I did review that note today. He actually has her on two weeks of linezolid and then following the patient will be on 1-2 months of Keflex. That is the plan currently. She has been on antibiotics therapy as prescribed by myself initially starting on July 30, 2018 and has been on that continuously up to this point. 08/30/18 on evaluation today patient actually appears to be  doing much better in regard to her right lateral malleolus ulcer. She has been tolerating the dressing changes specifically the snap vac without complication although she did have some issues with the seal currently. Apparently there was some trouble with getting it to maintain over the past week past Sunday. Nonetheless overall the wound appears better in regard to the right lateral malleolus region. In regard to left lateral malleolus this actually show some signs of additional granulation although there still tendon noted in the base of the wound this appears to be healthy not necrotic in any way whatsoever. We are considering potentially using a snap vac for the left lateral malleolus as well the product wrap  from [employer], [place], was present in the clinic today we're going to see this patient I did have her come in with me after obtaining consent from the patient and her daughter in order to look at the wound and see if there's any recommendation one way or another as to whether or not they felt the snapback could be beneficial for the left lateral malleolus region. But FAYTHE, HEITZENRATER ([id number]) the conclusion was that it might be but that this is definitely a little bit deeper wound than what traditionally would be utilized for a snap vac. 09/06/18 on evaluation today patient actually appears to be doing excellent in my pinion in regard to both ankle ulcers. She has been tolerating the dressing changes without complication which is great news. Specifically we have been using the snap vac. In regard to the right ankle I'm not even sure that this is going to be necessary for today and following as the wound has filled in quite nicely. In regard to the left ankle I do believe that we're seeing excellent epithelialization from the edge as well as granulation in the central portion the tendon is still exposed but there's no evidence of necrotic bone and in general I feel like the patient has made excellent progress even compared to last week with just one week of the snap vac. 09/11/18; this is a patient who has wounds on her bilateral lateral malleoli. Initially both of these were deep stage IV wounds in the setting of chronic arterial insufficiency. She has been revascularized. As I understand think she been using snap vacs to both of these wounds however the area on the right became more superficial and currently she is only using it on the left. Using silver collagen on the right and silver collagen under the back on the left I believe 09/19/18 on evaluation today patient actually appears to be doing very well in regard to her lateral malleolus or ulcers bilaterally. She has been tolerating the dressing  changes without complication. Fortunately there does not appear to be any evidence of infection at this time. Overall I feel like she is improving in an excellent manner and I'm very pleased with the fact that everything seems to be turning towards the better for her. This has obviously been a long road. 09/27/18 on evaluation today patient actually appears to be doing very well in regard to her bilateral lateral malleolus ulcers. She has been tolerating the dressing changes without complication. Fortunately there does not appear to be any evidence of infection at this time which is also great news. No fevers, chills, nausea, or vomiting noted at this time. Overall I feel like she is doing excellent with the snap vac on the left malleolus. She had 40 mL of fluid collection over the past week. 10/04/18  on evaluation today patient actually appears to be doing well in regard to her bilateral lateral malleolus ulcers. She continues to tolerate the dressing changes without complication. One issue that I see is the snap vac on the left lateral malleolus which appears to have sealed off some fluid underlying this area and has not really allowed it to heal to the degree that I would like to see. For that reason I did suggest at this point we may want to pack a small piece of packing strip into this region to allow it to more effectively wick out fluid. 10/11/18 in general the patient today does not feel that she has been doing very well. She's been a little bit lethargic and subsequently is having bodyaches as well according to what she tells me today. With that being said overall she has been concerned with the fact that something may be worsening although to be honest her wounds really have not been appearing poorly. She does have a new ulcer on her left heel unfortunately. This may be pressure related. Nonetheless it seems to me to have potentially started at least as a blister I do not see any evidence of  deep tissue injury. In regard to the left ankle the snap vac still seems to be causing the ceiling off of the deeper part of the wound which is in turn trapping fluid. I'm not extremely pleased with the overall appearance as far as progress from last week to this week therefore I'm gonna discontinue the snap vac at this point. 10/18/18 patient unfortunately this point has not been feeling well for the past several days. She was seen by Grayland Ormond her primary care provider who is a Librarian, academic at [facility]. Subsequently she states that she's been very weak and generally feeling malaise. No fevers, chills, nausea, or vomiting noted at this time. With that being said bloodwork was performed at the PCP office on the 11th of this month which showed a white blood cell count of 10.7. This was repeated today and shows a white blood cell count of 12.4. This does show signs of worsening. Coupled with the fact that she is feeling worse and that her left ankle wound is not really showing signs of improvement I feel like this is an indication that the osteomyelitis is likely exacerbating not improving. Overall I think we may also want to check her C-reactive protein and sedimentation rate. Actually did call [facility] office this afternoon while the patient was in the office here with me. Subsequently based on the findings we discussed treatment possibilities and I think that it is appropriate for [place] to go ahead and initiate treatment with doxycycline which I'm going to do. Subsequently he did agree to see about adding a CRP and sedimentation rate to her orders. If that has not already been drawn to where they can run it they will contact the patient she can come back to have that check. They are in agreement with plan as far as the patient and her daughter are concerned. Nonetheless also think we need to get in touch with Dr. Henreitta Leber office to see about getting the patient scheduled with  him as soon as possible. 11/08/18 on evaluation today patient presents for follow-up concerning her bilateral foot and ankle ulcers. I did do an extensive review of her chart in epic today. Subsequently she was seen by [name] he did initiate Cefepime IV antibiotic therapy. Subsequently she had some issues with her PICC  line this had to be removed because it was coiled and then replaced. Fortunately that was now settled. Unfortunately she has continued have issues with her left heel as well as the issues that she is experiencing with her bilateral lateral malleolus regions. I do believe however both areas seem to be doing a little bit better on evaluation today which is good news. No fevers, chills, nausea, or vomiting noted at this time. She actually has an angiogram schedule with Dr. dew on this coming Monday, November 11, 2018. Subsequently the patient states that she is feeling much better especially than what she was roughly 2 weeks ago. She actually had to cancel an appointment because she was feeling so poorly. No fevers, chills, nausea, or vomiting noted at this time. 11/15/18 on evaluation today patient actually is status post having had her angiogram with Dr. dew Monday, four days ago. 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 He had to go and work on several areas of the vasculature fortunately he was able to obtain no more than a 30% residual stenosis throughout post procedure. I reviewed this note today. I think this will definitely help with healing at this time. Fortunately there does not appear to be any signs of infection and I do feel like ratio already has a better appearance to it. 11/22/18 upon evaluation today patient actually appears to be doing very well in regard to her wounds in general. The right lateral malleolus looks excellent the heel looks better in the left lateral malleolus also appears to be doing a little better. With that being said the  right second toe actually appears to be open and training we been watching this is been dry and stable but now is open. 12/03/2018 Seen today for follow-up and management of multiple bilateral lower extremity wounds. New pressure injury of the great toe which is closed at this time. Wound of the right distal second toe appears larger today with deep undermining and a pocket of fluid present within the undermining region. Left and right malleolus is wounds are stable today with no signs and symptoms of infection.Denies any needs or concerns during exam today. 12/13/18 on evaluation today patient appears to be doing somewhat better in regard to her left heel ulcer. She also seems to be completely healed in regard to the right lateral malleolus ulcer. The left malleolus ulcer is smaller what unfortunately the wounds which are new over the first and second toes of the right foot are what are most concerning at this point especially the second. Both areas did require sharp debridement today. 12/20/18 on evaluation today patient's wound actually appears to be doing better in regard to left lateral ankle and her right lateral ankle continues to remain healed. The hill ulcer on the left is improved. She does have improvement noted as well in regard to both toe ulcers. Overall I'm very pleased in this regard. No fevers, chills, nausea, or vomiting noted at this time. LAYNE, LEBON ([id number]) 12/23/18 on evaluation today patient is seen after she had her toenails trimmed at the podiatrist office due to issues with her right great toe. There was what appeared to be dark eschar on the surface of the wound which had her in the podiatrist concerned. Nonetheless as I remember that during the last office visit I had utilize silver nitrate of this area I was much less concerned about the situation. Subsequently I was able to clean off much  of this tissue without any complication today. This does not appear to  show any signs of infection and actually look somewhat better compared to last time post debridement. Her second toe on the right foot actually had callous over and there did appear still be some fluid underneath this that would require debridement today. 12/27/18 on evaluation today patient actually appears to be showing signs of improvement at all locations. Even the left lateral ankle although this is not quite as great as the other sites. Fortunately there does not appear to be any signs of infection at this time and both of her toes on the right foot seem to be showing signs of improvement which is good news and very pleased in this regard. 01/03/19 on evaluation today patient appears to be doing better for the most part in regard to her wounds in particular. There does not appear to be any evidence of infection at this time which is good news. Fortunately there is no sign of really worsening anywhere except for the right great toe which she does have what appears to be a bruise/deep tissue injury which is very superficial and already resolving. I'm not sure where this came from I questioned her extensively and she does not recall what may have happened with this. Other than that the patient seems to be doing well even the left lateral ankle ulcer looks good and is getting smaller. 01/10/19 on evaluation today patient appears to be doing well in regard to her left heel wound and both of her toe wounds. Overall I feel like there is definitely improvement here and I'm happy in that regard. With that being said unfortunately she is having issues with the left lateral malleolus ulcer which unfortunately still has a lot of depth to it. This is gonna be a very difficult wound for [place] to be able to truly get to heal. I may want to consider some type of skin substitute to see if this would be of benefit for her. I'll discuss this with her more the next visit most likely. This was something I thought about  more at the end of the visit when I was Artie out of the room and the patient had been discharged. 01/17/19 on evaluation today patient appears to be doing very well in regard to her wounds in general. She's been making excellent progress at this time. Fortunately there's no sign of infection at this time either. No fevers, chills, nausea, or vomiting noted at this time. The biggest issue is still her left lateral malleolus where it appears to be doing well and is getting smaller but still shows a small corner where this is deeper and goes down into what appears to be the joint space. Nonetheless this is taking much longer to heal although it still looks better in smaller than previous evaluations. 01/24/19 on evaluation today patient's wounds actually appear to be doing rather well in general overall. She did require some sharp debridement in regard to the right great toe but everything else appears to be doing excellent no debridement was even necessary. No fevers, chills, nausea, or vomiting noted at this time. 01/31/19 on evaluation today patient actually appears to be doing much better in regard to her left foot wound on the heel as well as the ankle. The right great toe appears to be a little bit worse today this had callous over and trapped a lot of fluid underneath. Fortunately there's no signs of infection at any site which  is great news. 02/07/19 on evaluation today patient actually appears to be doing decently well in regard to all of her ulcers at this point. No sharp debridement was required she is a little bit of hyper granulation in regard to the left lateral ankle as well as the left heel but the hill itself is almost completely healed which is excellent news. Overall been very pleased in this regard. 02/14/19 on evaluation today patient actually appears to be doing very well in regard to her ulcers on the right first toe, left lateral malleolus, and left heel. In fact the heel is almost  completely healed at this point. The patient does not show any signs of infection which is good news. Overall very pleased with how things have progressed. 04/18/19 Telehealth Evaluation During the COVID-19 National Emergency: Verbal Consent: Obtained from patient Allergies: reviewed and the active list is current. Medication changes: patient has no current medication changes. COVID-19 Screening: 1. Have you traveled internationally or on a cruise ship in the last 14 dayso No 2. Have you had contact with someone with or under investigation for COVID-19o No 3. Have you had a fever, cough, sore throat, or experiencing shortness of breatho No on evaluation today actually did have a visit with this patient through a telehealth encounter with her home health nurse. Subsequently it was noted that the patient actually appears to be doing okay in regard to her wounds both the right great toe as well as the left lateral malleolus have shown signs of improvement although this in your theme around the left lateral malleolus there eschar coverings for both locations. The question is whether or not they are actually close and whether or not home health needs to discharge the patient or not. Nonetheless my concern is this point obviously is that without actually seeing her and being able to evaluate this directly I cannot ensure that she is completely healed which is the question that I'm being asked. 04/22/19 on evaluation today patient presents for her first evaluation since last time I saw her which was actually February 14, 2019. I did do a telehealth visit last week in which point it was questionable whether or not she may be healed and had to bring her in today for confirmation. With that being said she does seem to be doing quite well at this point which is good news. There does not appear to be any drainage in the deed I believe her wounds may be healed. Readmission: 09/04/2019 on evaluation today patient  appears to be doing unfortunately somewhat more poorly in regard to her left foot ulcer secondary to a wound that began on 08/21/2019 at least when she first noticed this. Fortunately she has not had any evidence of active infection at this time. Systemically. I also do not necessarily see any evidence of infection at the blister/wound site on the first metatarsal head plantar aspect. This almost appears to be something that may have just rubbed inappropriately causing this to breakdown. They did not want a wait too long to come in to be seen as again she had significant issues in the past with wounds that took quite a while to heal in fact it was close to 2 years. Nonetheless this does not appear to be quite that bad but again we do need to remove some of the necrotic tissue from the surface of the wound to tell exactly the extent. She does not appear to have any significant arterial disease at this point  and again her last ABIs and TBI's are recorded above in the alert section her left ABI was 1.27 with a TBI of 0.72 to the right ABI 1.08 with a TBI of 0.39. Other than this the patient has been doing quite ZYLIAH, SCHIER ([id number]) well since I last saw her and that was in May 2020. 09/11/2019 on evaluation today patient appeared to be doing very well with regard to her plantar foot ulcer on the left. In fact this appears to be almost completely healed which is awesome. That is after just 1 week of intervention. With that being said there is no signs of active infection at this time. 09/18/2019 on evaluation today patient actually appears to be doing excellent in fact she is completely healed based on what I am seeing at this point. Fortunately there is no signs of active infection at this time and overall patient is very pleased to hear that this area has healed so quickly. Readmission: 05/13/2021 upon evaluation today this patient presents for reevaluation here in the clinic. This is a wound  that actually we previously took care of. She had 1 on the right ankle and the left the left turned out to be be harder due to to heal but nonetheless is doing great at this point as the right that has reopened and it was noted first just several weeks ago with a scab over it and came off in just the past few days. Fortunately there does not appear to be any obvious evidence of significant active infection at this time which is great news. No fevers, chills, nausea, vomiting, or diarrhea. 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 There does not appear to be any signs of this interfering in any way with her wound. She does have swelling we previously had compression socks for her ordered but again it does not look like she wears these on a regular basis by any means. 05/26/2021 upon evaluation today patient appears to be doing well with regard to her wound which is actually showing signs of excellent improvement. There does not appear to be any signs of active infection which is great news and overall very pleased with where things stand today. No fevers, chills, nausea, vomiting, or diarrhea. 06/02/2021 upon evaluation today patient's wound actually showing signs of excellent improvement. Fortunately there does not appear to be any signs of active infection which is great news. I think the patient is making good progress with regard to her wounds in general. 06/09/2021 upon evaluation today patient appears to be doing excellent in regard to her wounds currently. Fortunately there does not appear to be any signs of active infection which is great news. No fevers, chills, nausea, vomiting, or diarrhea. Overall extremely pleased with where things stand today. I think the patient is making excellent progress. 06/16/2021 upon evaluation today patient appears to be doing well in regard to her wound. This is going require little bit of debridement today and that  was discussed with the patient. Otherwise she seems to be doing quite well and I am actually very pleased with where things stand at this point. No fevers, chills, nausea, vomiting, or diarrhea. 06/23/2021 upon evaluation today patient appears to be doing well with regard to her wounds. She has been tolerating the dressing changes without complication. Fortunately there does not appear to be any evidence of infection and she has not had air in her home which she actually  lives at an assisted living that got fixed this morning. With that being said because of that her wrap has been extremely hot and bothersome for her over the past week. 06/30/2021 upon evaluation today patient is actually making excellent progress in regard to her ankle ulcer. She has been tolerating the dressing changes without complication and overall extremely pleased with where things stand there does not appear to be any evidence of active infection which is great news. No fevers, chills, nausea, vomiting, or diarrhea. 07/07/21 upon evaluation today patients and culture on the right actually appears to be doing quite well. There does not appear to be any signs of infection and overall very pleased with where things stand today. No fevers, chills, nausea, or vomiting noted at this time. 07/14/2021 unfortunately the patient today has some evidence of deep tissue injury and pressure getting to the ankle region. Again I am not exactly sure what is going on here but this is very similar to issues that we have had in the past. I explained to the patient that she needs to be very mindful of exactly what is happening I think sleeping in bed is probably the main issue here although there could be other culprits I am not sure what else would potentially lead to this kind of a problem for her. 07/21/2021 upon evaluation today patient's wound actually showing signs of improvement compared to last week. Fortunately there does not appear to be any  signs of active infection which is great news and overall very pleased with where things stand in that regard. With that being said I do believe that she is continuing to show signs of overall of getting better although I think this is still basically about what we were 2 weeks ago due to the worsening and now improvement. 07/28/2021 upon evaluation today patient appears to be doing well with regard to her wound. She does have some slough buildup on the surface of the wound which I would have to manage today. Fortunately there is no sign of active infection at this time. No fevers, chills, nausea, vomiting, or diarrhea. 08/04/2021 upon evaluation today patient appears to be doing about the same in regard to her wound. To be perfectly honest I am beginning to be a little bit concerned about the overall appearance of the wound bed. I do think possibly taking a sample right around the margin of the wound could be beneficial for her as far as identifying anything such as an inflammatory process or to be honest even a skin tag cancer type process that may be of concern here. Fortunately there does not appear to be any evidence of active infection at this time which is great news she is not having any pain also great news. 08/11/2021 upon evaluation today patient appears to be doing well with regard to her wound. The good news is I did review her biopsy results and it showed some inflammatory mixed findings but nothing that appeared to be malignant which is great news. Overall this is more of a chronic venous stasis type issue which again is more what we have been treating. Nonetheless I just wanted to make sure before going forward that there was not anything more untoward going on at this point. 08/18/2021 upon evaluation today patient appears to be doing well with regard to her ankle ulcer. Fortunately there does not appear to be any signs of active infection at this time which is great overall wound is  dramatically improved compared  to last week. Since last week I have actually placed her on doxycycline and subsequently this is a good option as far as the findings are concerned at this point. 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 the doxycycline is doing that. There does not appear to be any evidence of active infection systemically which is great news. 08/25/2021 upon evaluation today patient appears to be doing well with regard to her wound. I feel like we are finally get back on track as far as healing is concerned I am much happier with the overall appearance today. I do think that she is tolerating the dressing changes without complication which is great news. We have been using Hydrofera Blue which I think is a good option. The good news is she is also doing great in [place], MANASA SPEASE. ([id number]) regard to her compression sock on the left which is a zipper compression that seems to be doing a great job keeping her edema under good control. 09/01/2021 upon evaluation today patient appears to be doing well with regard to her wound. Infection seems to be under much better control which is great news and very pleased in that regard. Fortunately there does not appear to be any signs of infection currently. 09/08/2021 upon evaluation today patient actually appears to be making good progress in regard to her wound. She has been tolerating the dressing changes without complication. Fortunately there does not appear to be any evidence of active infection at this time which is great news as well. No fevers, chills, nausea, vomiting, or diarrhea. 09/22/2021 upon evaluation today patient appears to be doing well with regard to her wound although is very slow to heal. We have not looked into Apligraf yet I think that is something that we should see about doing. 09/29/2021  upon evaluation today patient's wound is actually showing signs of doing about the same. I am not seeing any evidence of worsening but also no significant evidence of improvement. We did gain approval for the organogenesis products all except for Apligraf as covered by her insurance. With that being said I do think that we can go ahead and proceed with the NuShield if the patient and her family in agreement of the plan I discussed that with him today she does have a 20% coinsurance which we also discussed. 10/06/2021 upon evaluation today patient appears to unfortunately be doing a little bit worse she appears to be infected based on what I am seeing. Fortunately there does not appear to be any signs of active infection at this time which is great news. Unfortunately it does appear to be some evidence of around the wound edge indicated by way of erythema and warmth as well as redness 10/13/2021 upon evaluation today patient actually appears to be doing excellent in regard to her ankle ulcer compared to what it was. 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 I think were fine to go ahead and apply the NuShield today. 10/20/2021 upon evaluation today patient appears to be doing excellent in fact the NuShield seems to have done all some for her thus far. I am actually very pleased with where we stand  and overall I think that she is making great progress. There is no evidence of active infection at this time. 11/23; patient presents for follow-up. She has no issues or complaints today. She denies signs of infection. She reports stability in her wound healing. 11/03/2021 upon evaluation today patient appears to be doing well with regard to her wounds. She has been tolerating the dressing changes without  complication. Fortunately there is no signs of active infection at this time. 11/10/2021 upon evaluation today patient appears to be doing well with regard to her right ankle which is actually showing signs of improvement with the NuShield I am very pleased. Subsequently in regards to the left ankle this appears to be doing okay with no evidence of issue here either. With that being said she has a significant contusion on the left leg from having fallen 2 days ago. She tells me that she was using her walker going to the closet and then when she got to the closet turned around to get something out at which point she fell according to the story. Nonetheless she does have a hematoma just below her knee on the anterior portion of her shin. My hope is that this will not open until wound although we do need I think Some compression over it also think that we need to have her use an ice as well to help with the swelling and prevent this from getting worse. The last thing she needs is a wound opened up here. 11/17/2021 upon evaluation patient's right ankle actually showing signs of improvement based on what I see currently there is a lot of new skin growth coming in which is great news. Nonetheless I do feel like that the patient is showing improvement as well in regard to her left leg with the use of the Tubigrip it swollen but not as bruised as what I would have expected after what I was seeing last week. Nonetheless I do think doubling up on the Tubigrip would probably be beneficial to try to keep some of the edema under better control here. 11/24/2021 upon evaluation today patient appears to be doing excellent in regard to her wound. This is actually measuring significantly smaller which is great news. Fortunately I do not see any signs of active infection locally nor systemically at this point. Overall I am very pleased with how the NuShield is doing. 12/30; wound bed looks healthy however not much change  in overall wound volume. We applied Nushield again today in the standard fashion 12/08/2021 upon evaluation today patient's wound actually showing signs of good improvement and I am actually very pleased with where we stand today as well. I do not see any evidence of active infection locally nor systemically which is great news. Unfortunately she has been having some issues with stomach upset and diarrhea today. 12/15/2021 upon evaluation today patient appears to be doing excellent in regard to her wound. There is a little bit of dry skin raised up around the edges of the wound but fortunately nothing too significant at this point. Fortunately I do not see any evidence of active infection either which is great news. No fevers, chills, nausea, vomiting, or diarrhea. 12/29/2021 upon evaluation today patient appears to be doing well with regard to her wound. In general I feel like she is getting very close to complete resolution. I think that she is making good progress here as well. Overall I do not see any signs of infection and very little of this  appears to actually be open. 01/12/2022 upon evaluation today patient appears to be doing a little bit worse in regard to her wound. It appears that the collagen actually trapped fluid underneath the collagen which got hard and subsequently caused the fluid to back up. This patient has made the wound appear to be larger than what it was previous. With that being said I do not see any signs of obvious infection is not warm to touch and not significantly erythematous either which is good news. Nonetheless I do think that we will need to continue to keep an eye on this. Probably not go put on antibiotic this week but I will be too far from doing so she is actually on a Z-Pak right now I do not want to really double up on antibiotics. 01/26/2022 upon evaluation today patient's wound is showing signs of improvement although this is slow to turn back around. Fortunately  there does not appear to be any evidence of active infection locally or systemically which is great news and overall I am extremely pleased with where we stand today. No fevers, chills, nausea, vomiting, or diarrhea. 02/02/2022 upon evaluation today patient appears to be doing well with regard to her wound. She has been tolerating the dressing changes without complication. Fortunately there does not appear to be any signs of active infection locally nor systemically at this time which is great news. No fevers, chills, nausea, vomiting, or diarrhea. FRANCIA, VERRY ([id number]) 3/9; patient presents for follow-up. She has no issues or complaints today. She denies signs of infection. 02/16/2022 upon evaluation today patient appears to be doing well with regard to her wound. She has been tolerating the dressing changes without complication. Fortunately I do not see any evidence of active infection locally nor systemically at this point which is great news. Overall I think that the patient is making excellent progress in general. The wound is measuring smaller and though there does appear to be some need for sharp debridement today this is minimal compared to what we have noted previous. 02/23/2022 upon evaluation today patient appears to be doing well with regard to her wound she is definitely showing signs of improvement which is great news. 03/02/2022 upon evaluation today patient appears to be doing about the same in regard to her wound. Unfortunately were not seeing significant improvement. With that being said is also not significantly worse which is great news. No fevers, chills, nausea, vomiting, or diarrhea. 03-09-2022 upon evaluation today patient appears to be doing well with regard to her wound. She has been tolerating the dressing changes without complication. Fortunately there does not appear to be any signs of active infection locally or systemically which is great news. 03-16-2022 upon  evaluation today patient appears to be doing well with regard to her wound this is measuring smaller and looking much better. I am actually very pleased with where we stand and I think that the patient is making great progress. I do not see any evidence of active infection locally or systemically which is great news. No fevers, chills, nausea, vomiting, or diarrhea. 03-23-2022 upon evaluation today patient appears to be doing better in regard to her wounds. In fact the wound area actually is showing signs of excellent improvement and actually very pleased with where we stand today. I do not see any evidence of active infection locally nor systemically which is great news. 4/27; comes in today with again thick callus around the wound circumference which I removed with a  curette. Some debris on the surface. Overall I do not think quite as good as it was last week by description. Using endoform 04-06-2022 upon evaluation today patient appears to be doing pretty well in regard to her wound. Fortunately I do not see any evidence of active infection locally or systemically which is great news and overall I am pleased in that regard. I do feel like this is measuring smaller postdebridement compared to where we were previous. Overall she is headed in the right direction. She does have an area that is threatening to open and looks a little irritated on the left ankle we did measure this just for the discolored area for now its not draining too much but we want to monitor and make sure nothing worsens here. 04-13-2022 upon evaluation today patient appears to be doing better in regard to her wound although this is still very slow to heal. We have reapplied for a skin substitute but have not heard anything back as of yet. Fortunately I do not see any evidence of active infection locally or systemically at this time which is great news. 04-20-2022 upon evaluation today patient's wound is actually showing signs of  significant improvement which is great news. We did get approval for skin substitutes which I think is an option here for [place] but at the same time I am not even certain that this is going to be necessary especially considering how well things seem to be doing at this point. If she continues healing as we see her right now but I think were probably going to be able to avoid any need for additional skin substitute. Patient and her daughter are both in agreement with that plan. With that being said she did have a fall she has several areas of contusion fortunately nothing that is can require intervention at this point but nonetheless she did also fracture her rib which is extremely uncomfortable obviously. 04-27-2022 upon evaluation today patient appears to be doing well currently in regard to her wound on the right lateral ankle. Overall I feel like she is actually doing significantly better even compared to last week and very pleased in this regard. I think we are on the right track here. 05-04-2022 upon evaluation today patient appears to be doing well with regard to her wound. She is going require some sharp debridement today to clear away some of the necrotic debris. Fortunately I was able to do this quite easily without significant issue or breakdown here. There is a very small area of opening centrally but really this is pretty close to getting close. 05-11-2022 upon evaluation today patient's wound is actually showing signs of excellent improvement. I do not see any evidence of infection locally or systemically at this time. Overall I think she is very close to complete resolution with just a little bit of debridement to clear away some of the dry crusty area around the edges of the wound currently. 05-18-2022 upon evaluation patient's wound bed actually appears to be likely healed although I cannot be 100% sure I am extremely pleased however with where things stand today. I do not see any evidence of  infection locally or systemically which is great news and overall I think you are on the right track here. 05-25-2022 upon evaluation today patient appears to be doing well with regard to her leg ulcer. Fortunately there does not appear to be any signs of active infection locally or systemically at this time which is great news. No  fevers, chills, nausea, vomiting, or diarrhea. With that being said she does appear to be healed in regard to her right lateral ankle which is great news. In regard to her leg unfortunately during the removal of the compression wrap there was a small skin tear that occurred with the scissors. Subsequently the patient does have a minimal amount of bleeding this is going require a couple Steri-Strips in order to seal this up. I do think it should likely heal quite well. Obviously this was unintentional. Nonetheless we do have to definitely monitor make sure that this completely heals up as well I am hopeful that we will be the case by next week to be honest. 06-01-2022 upon evaluation today patient appears to be doing excellent in regard to her wounds. Both areas are completely healed which is great news. Fortunately I do not see any signs of active infection locally or systemically at this time which is great and overall I am extremely pleased in that regard. No fevers, chills, nausea, vomiting, or diarrhea. 7/25; patient returns to clinic today with a story that 2 to 3 days ago she was sitting in her chair and she noticed bleeding from her left lower leg lateral aspect. She denies any trauma. She used pressure and a Band-Aid to get it to stop and she arrives in clinic today in follow-up. She was discharged on 6/29 apparently to stockings although she did not have any on and I do not think she was actually wearing them. She has significant chronic venous insufficiency 07-04-2022 upon evaluation today patient presents for follow-up. In the clinic she actually was seen last week  when I was on vacation due to an area on her leg which reopened unfortunately. With that being said she does seem to be doing a little bit better but unfortunately this is still [place], [name] ([id number]) giving her some trouble here. All of her other wounds are still closed which I was previously seen her for and that is great news. 07-13-2022 upon evaluation today patient appears to be doing well with regard to her wound everything appears to be healed unfortunately she has a completely new area on her left heel which is a blister. Fortunately I do not see any signs of infection but unfortunately I do think this is going to be ongoing issue at this time. Question has been made whether or not we can get this to reabsorb or not. Again right now I am going to treat this more as a deep tissue injury currently. 07-20-2022 upon evaluation today patient's wound is actually showing signs of being somewhat worse I definitely think she is infected at this point. We will get a need to subsequently have her started on antibiotic as soon as possible. She voiced understanding and her daughter is in agreement with this plan. We were hoping that we would get the blister reabsorb that just did not happen. 07-27-2022 upon evaluation today patient's ankle ulcer unfortunately is not doing nearly as well as well would like to see. Fortunately there does not appear to be any signs of infection locally or systemically at this time which is great news and overall I am extremely pleased. With that being said unfortunately the patient continues to have issues with her left lateral ankle which is now open and subsequently the heel which again is of concern here. 08-03-2022 upon evaluation today patient appears to be doing better in regard to the heel though she is have some issues in regard  to this left lateral ankle region. Unfortunately I think that this is just going to take some time I think we probably need to add  Santyl to the regimen. Objective Constitutional Well-nourished and well-hydrated in no acute distress. Vitals Time Taken: 10:07 AM, Height: 62 in, Weight: 150 lbs, BMI: 27.4, Temperature: 98.1 F, Pulse: 60 bpm, Respiratory Rate: 18 breaths/min, Blood Pressure: 128/67 mmHg. Respiratory normal breathing without difficulty. Psychiatric this patient is able to make decisions and demonstrates good insight into disease process. Alert and Oriented x 3. pleasant and cooperative. General Notes: Upon inspection patient's wounds did require sharp debridement not perform debridement at all locations today. Postdebridement wound bed appears to be doing significantly better which is great news. Integumentary (Hair, Skin) Wound #11 status is Open. Original cause of wound was Pressure Injury. The date acquired was: 07/20/2022. The wound has been in treatment 2 weeks. The wound is located on the Left Calcaneus. The wound measures 4cm length x 2.4cm width x 0.1cm depth; 7.54cm^2 area and 0.754cm^3 volume. There is Fat Layer (Subcutaneous Tissue) exposed. There is no tunneling or undermining noted. There is a small amount of serosanguineous drainage noted. There is small (1-33%) pink granulation within the wound bed. There is a large (67-100%) amount of necrotic tissue within the wound bed including Eschar and Adherent Slough. Wound #12 status is Open. Original cause of wound was Gradually Appeared. The date acquired was: 07/17/2022. The wound has been in treatment 2 weeks. The wound is located on the Left,Lateral Ankle. The wound measures 4.6cm length x 3.8cm width x 0.2cm depth; 13.729cm^2 area and 2.746cm^3 volume. There is Fat Layer (Subcutaneous Tissue) exposed. There is a medium amount of serosanguineous drainage noted. There is medium (34-66%) red granulation within the wound bed. There is a medium (34-66%) amount of necrotic tissue within the wound bed including Adherent Slough. Assessment Active  Problems ICD-10 Non-pressure chronic ulcer of other part of left lower leg with other specified severity Contusion of left lower leg, subsequent encounter Pressure ulcer of left heel, stage 3 Lymphedema, not elsewhere classified Venous insufficiency (chronic) (peripheral) Presence of cardiac pacemaker Long term (current) use of anticoagulants Melody, [name] ([id number]) Procedures Wound #11 Pre-procedure diagnosis of Wound #11 is a Pressure Ulcer located on the Left Calcaneus . There was a Excisional Skin/Subcutaneous Tissue Debridement with a total area of 9.6 sq cm performed by Tommie Sams., PA-C. With the following instrument(s): Curette to remove Viable and Non-Viable tissue/material. Material removed includes Subcutaneous Tissue and Slough and. A time out was conducted at 10:29, prior to the start of the procedure. A Minimum amount of bleeding was controlled with Pressure. The procedure was tolerated well. Post Debridement Measurements: 4cm length x 2.4cm width x 0.1cm depth; 0.754cm^3 volume. Post debridement Stage noted as Category/Stage III. Character of Wound/Ulcer Post Debridement is stable. 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 There was a Excisional Skin/Subcutaneous Tissue Debridement with a total area of 24.75 sq cm performed by Tommie Sams., PA-C. With the following instrument(s): Curette to remove Viable and Non-Viable tissue/material. Material removed includes Callus, Subcutaneous Tissue, and Slough. A time out was conducted at 10:31, prior to the start of the procedure. A Minimum amount of bleeding was controlled with Pressure. The procedure was tolerated well. Post Debridement Measurements: 4.6cm length x 3.8cm width x 0.2cm depth; 2.746cm^3  volume. Character of Wound/Ulcer Post  Debridement is stable. Severity of Tissue Post Debridement is: Fat layer exposed. Post procedure Diagnosis Wound #12: Same as Pre-Procedure Plan Follow-up Appointments: Return Appointment in 1 week. Bathing/ Shower/ Hygiene: May shower with wound dressing protected with water repellent cover or cast protector. No tub bath. Anesthetic (Use 'Patient Medications' Section for Anesthetic Order Entry): Lidocaine applied to wound bed Edema Control - Lymphedema / Segmental Compressive Device / Other: Tubigrip single layer applied. - Tubi D single layer left lower leg Elevate, Exercise Daily and Avoid Standing for Long Periods of Time. Elevate legs to the level of the heart and pump ankles as often as possible Elevate leg(s) parallel to the floor when sitting. Non-Wound Condition: Cleanse affected area with antibacterial soap and water, Medications-Please add to medication list.: P.O. Antibiotics - continue Doxycycline The following medication(s) was prescribed: Santyl topical 250 unit/gram ointment ointment topical Apply nickel thick daily to the wound bed and then cover with a dressing as directed in clinic x 30 days starting 08/03/2022 doxycycline hyclate oral 100 mg capsule 1 1 capsule oral taken 2 times per day for 14 days starting 08/03/2022 WOUND #11: - Calcaneus Wound Laterality: Left Cleanser: Byram Ancillary Kit - 15 Day Supply (Generic) 3 x Per Week/30 Days Discharge Instructions: Use supplies as instructed; Kit contains: (15) Saline Bullets; (15) 3x3 Gauze; 15 pr Gloves Primary Dressing: Xeroform 4x4-HBD (in/in) 3 x Per Week/30 Days Discharge Instructions: Apply Xeroform 4x4-HBD (in/in) as directed Secondary Dressing: ABD Pad 5x9 (in/in) 3 x Per Week/30 Days Discharge Instructions: Cover with ABD pad Secondary Dressing: Gauze 3 x Per Week/30 Days Discharge Instructions: As directed: dry, moistened with saline or moistened with Dakins Solution Secured With: Conform 4'' - Conforming  Stretch Gauze Bandage 4x75 (in/in) 3 x Per Week/30 Days Discharge Instructions: Apply as directed Secured With: Tubigrip Size D, 3x10 (in/yd) 3 x Per Week/30 Days Discharge Instructions: single layer WOUND #12: - Ankle Wound Laterality: Left, Lateral Cleanser: Byram Ancillary Kit - 15 Day Supply (Generic) 3 x Per Week/30 Days Discharge Instructions: Use supplies as instructed; Kit contains: (15) Saline Bullets; (15) 3x3 Gauze; 15 pr Gloves Topical: Santyl Collagenase Ointment, 30 (gm), tube 3 x Per Week/30 Days Discharge Instructions: apply nickel thick to wound bed only Primary Dressing: Xeroform 4x4-HBD (in/in) (Dispense As Written) 3 x Per Week/30 Days Discharge Instructions: Apply Xeroform 4x4-HBD (in/in) as directed Secondary Dressing: ABD Pad 5x9 (in/in) (Generic) 3 x Per Week/30 Days Discharge Instructions: Cover with ABD pad Secondary Dressing: Gauze 3 x Per Week/30 Days Discharge Instructions: As directed: dry, moistened with saline or moistened with Dakins Solution Secured With: Medipore Tape - 50M Medipore H Soft Cloth Surgical Tape, 2x2 (in/yd) (Generic) 3 x Per Week/30 Days Secured With: Conform 4'' - Conforming Stretch Gauze Bandage 4x75 (in/in) (Generic) 3 x Per Week/30 Days HANIA, CERONE ([id number]) Discharge Instructions: Apply as directed Secured With: Tubigrip Size D, 3x10 (in/yd) 3 x Per Week/30 Days Discharge Instructions: single layer 1. I am good recommend currently that we go ahead and continue with the Xeroform gauze in general I think this is doing quite well though I am going to add the Santyl to the wound bed on the ankle region. 2. I am also can recommend that we have the patient continue with the ABD pad and roll gauze to secure place. 3. Also can recommend the Tubigrip size D be continued as well which I think is helping with edema control. We will see patient back  for reevaluation in 1 week here in the clinic. If anything worsens or changes patient will  contact our office for additional recommendations. Electronic Signature(s) Signed: 08/03/2022 11:12:10 AM By: Worthy Keeler PA-C Entered By: Worthy Keeler on 08/03/2022 11:12:10 Dedman, [name] ([id number]) -------------------------------------------------------------------------------- SuperBill Details Patient Name: [name] Date of Service: 08/03/2022 Medical Record Number: [id number] Patient Account Number: [id number] Date of Birth/Sex: [birth year] (88 y.o. F) Treating RN: Cornell Barman Primary Care Provider: Adrian Prows Other Clinician: Massie Kluver Referring Provider: Adrian Prows Treating Provider/Extender: Skipper Cliche in Treatment: 63 Diagnosis Coding ICD-10 Codes Code Description [phone] Non-pressure chronic ulcer of other part of left lower leg with other specified severity S80.12XD Contusion of left lower leg, subsequent encounter L89.623 Pressure ulcer of left heel, stage 3 I89.0 Lymphedema, not elsewhere classified I87.2 Venous insufficiency (chronic) (peripheral) Z95.0 Presence of cardiac pacemaker Z79.01 Long term (current) use of anticoagulants Facility Procedures CPT4 Code: [id number] Description: 11042 - DEB SUBQ TISSUE 20 SQ CM/< Modifier: Quantity: 1 CPT4 Code: Description: ICD-10 Diagnosis Description L89.623 Pressure ulcer of left heel, stage 3 L97.828 Non-pressure chronic ulcer of other part of left lower leg with other spec Modifier: ified severity Quantity: CPT4 Code: [id number] Description: 59163 - DEB SUBQ TISS EA ADDL 20CM Modifier: Quantity: 1 CPT4 Code: Description: ICD-10 Diagnosis Description L97.828 Non-pressure chronic ulcer of other part of left lower leg with other spec L89.623 Pressure ulcer of left heel, stage 3 Modifier: ified severity Quantity: Physician Procedures CPT4 Code: [id number] Description: 11042 - WC PHYS SUBQ TISS 20 SQ CM Modifier: Quantity: 1 CPT4 Code: Description: ICD-10 Diagnosis  Description L89.623 Pressure ulcer of left heel, stage 3 L97.828 Non-pressure chronic ulcer of other part of left lower leg with other speci Modifier: fied severity Quantity: CPT4 Code: [id number] Description: 11045 - WC PHYS SUBQ TISS EA ADDL 20 CM Modifier: Quantity: 1 CPT4 Code: Description: ICD-10 Diagnosis Description L97.828 Non-pressure chronic ulcer of other part of left lower leg with other speci L89.623 Pressure ulcer of left heel, stage 3 Modifier: fied severity Quantity: Electronic Signature(s) Signed: 08/03/2022 11:12:39 AM By: Worthy Keeler PA-C Entered By: Worthy Keeler on 08/03/2022 11:12:39

## 2022-08-04 NOTE — Progress Notes (Signed)
MILLA, WAHLBERG ([id number]) Visit Report for 08/03/2022 Arrival Information Details Patient Name: KASYN, STOUFFER. Date of Service: 08/03/2022 9:45 AM Medical Record Number: [id number] Patient Account Number: [id number] Date of Birth/Sex: Sep 30, 1923 (88 y.o. F) Treating RN: Cornell Barman Primary Care Tyronda Vizcarrondo: Adrian Prows Other Clinician: Massie Kluver Referring Dolores Mcgovern: Adrian Prows Treating Mellany Dinsmore/Extender: Skipper Cliche in Treatment: 59 Visit Information History Since Last Visit All ordered tests and consults were completed: No Patient Arrived: [name] Added or deleted any medications: No Arrival Time: 10:07 Any new allergies or adverse reactions: No Transfer Assistance: None Had a fall or experienced change in No Patient Requires Transmission-Based No activities of daily living that may affect Precautions: risk of falls: Patient Has Alerts: Yes Hospitalized since last visit: No Patient Alerts: Patient on Blood Pain Present Now: No Thinner NOT DIABETIC ***ELIQUIS*** Electronic Signature(s) Signed: 08/03/2022 4:44:47 PM By: Massie Kluver Entered By: Massie Kluver on 08/03/2022 10:07:20 Waldrip, [name] ([id number]) -------------------------------------------------------------------------------- Clinic Level of Care Assessment Details Patient Name: [name] Date of Service: 08/03/2022 9:45 AM Medical Record Number: [id number] Patient Account Number: [id number] Date of Birth/Sex: June 01, 1923 (88 y.o. F) Treating RN: Cornell Barman Primary Care Neeko Pharo: Adrian Prows Other Clinician: Massie Kluver Referring Diar Berkel: Adrian Prows Treating Hollace Michelli/Extender: Skipper Cliche in Treatment: 79 Clinic Level of Care Assessment Items TOOL 1 Quantity Score '[]'  - Use when EandM and Procedure is performed on INITIAL visit 0 ASSESSMENTS - Nursing Assessment / Reassessment '[]'  - General Physical Exam (combine w/ comprehensive assessment  (listed just below) when performed on new 0 pt. evals) '[]'  - 0 Comprehensive Assessment (HX, ROS, Risk Assessments, Wounds Hx, etc.) ASSESSMENTS - Wound and Skin Assessment / Reassessment '[]'  - Dermatologic / Skin Assessment (not related to wound area) 0 ASSESSMENTS - Ostomy and/or Continence Assessment and Care '[]'  - Incontinence Assessment and Management 0 '[]'  - 0 Ostomy Care Assessment and Management (repouching, etc.) PROCESS - Coordination of Care '[]'  - Simple Patient / Family Education for ongoing care 0 '[]'  - 0 Complex (extensive) Patient / Family Education for ongoing care '[]'  - 0 Staff obtains Programmer, systems, Records, Test Results / Process Orders '[]'  - 0 Staff telephones HHA, Nursing Homes / Clarify orders / etc '[]'  - 0 Routine Transfer to another Facility (non-emergent condition) '[]'  - 0 Routine Hospital Admission (non-emergent condition) '[]'  - 0 New Admissions / Biomedical engineer / Ordering NPWT, Apligraf, etc. '[]'  - 0 Emergency Hospital Admission (emergent condition) PROCESS - Special Needs '[]'  - Pediatric / Minor Patient Management 0 '[]'  - 0 Isolation Patient Management '[]'  - 0 Hearing / Language / Visual special needs '[]'  - 0 Assessment of Community assistance (transportation, D/C planning, etc.) '[]'  - 0 Additional assistance / Altered mentation '[]'  - 0 Support Surface(s) Assessment (bed, cushion, seat, etc.) INTERVENTIONS - Miscellaneous '[]'  - External ear exam 0 '[]'  - 0 Patient Transfer (multiple staff / Civil Service fast streamer / Similar devices) '[]'  - 0 Simple Staple / Suture removal (25 or less) '[]'  - 0 Complex Staple / Suture removal (26 or more) '[]'  - 0 Hypo/Hyperglycemic Management (do not check if billed separately) '[]'  - 0 Ankle / Brachial Index (ABI) - do not check if billed separately Has the patient been seen at the hospital within the last three years: Yes Total Score: 0 Level Of Care: ____ [name] ([id number]) Electronic Signature(s) Signed: 08/03/2022  4:44:47 PM By: Massie Kluver Entered By: Massie Kluver on 08/03/2022 10:37:01 Bunn, [name] ([id number]) -------------------------------------------------------------------------------- Encounter Discharge Information Details Patient Name: [id number],  [name]. Date of Service: 08/03/2022 9:45 AM Medical Record Number: [id number] Patient Account Number: [id number] Date of Birth/Sex: [birth year] (88 y.o. F) Treating RN: Cornell Barman Primary Care Jorrell Kuster: Adrian Prows Other Clinician: Massie Kluver Referring Maxwell Martorano: Adrian Prows Treating Latitia Housewright/Extender: Skipper Cliche in Treatment: 23 Encounter Discharge Information Items Post Procedure Vitals Discharge Condition: Stable Temperature (F): 98.1 Ambulatory Status: Walker Pulse (bpm): 60 Discharge Destination: Home Respiratory Rate (breaths/min): 18 Transportation: Private Auto Blood Pressure (mmHg): 128/67 Accompanied By: daughter Schedule Follow-up Appointment: Yes Clinical Summary of Care: Electronic Signature(s) Signed: 08/03/2022 4:44:47 PM By: Massie Kluver Entered By: Massie Kluver on 08/03/2022 12:00:56 Goudeau, [name] ([id number]) -------------------------------------------------------------------------------- Lower Extremity Assessment Details Patient Name: [name] Date of Service: 08/03/2022 9:45 AM Medical Record Number: [id number] Patient Account Number: [id number] Date of Birth/Sex: 07-04-1923 (88 y.o. F) Treating RN: Cornell Barman Primary Care Nalanie Winiecki: Adrian Prows Other Clinician: Massie Kluver Referring Elnora Quizon: Adrian Prows Treating Beanca Kiester/Extender: Skipper Cliche in Treatment: 63 Edema Assessment Assessed: [Left: Yes] [Right: No] Edema: [Left: Ye] [Right: s] Calf Left: Right: Point of Measurement: 33 cm From Medial Instep 25 cm Ankle Left: Right: Point of Measurement: 12 cm From Medial Instep 17.3 cm Vascular Assessment Pulses: Dorsalis Pedis Palpable:  [Left:Yes] Electronic Signature(s) Signed: 08/03/2022 4:44:47 PM By: Massie Kluver Signed: 08/03/2022 6:20:35 PM By: Gretta Cool, BSN, RN, CWS, Kim RN, BSN Entered By: Massie Kluver on 08/03/2022 10:23:04 Marian, [name] ([id number]) -------------------------------------------------------------------------------- Multi Wound Chart Details Patient Name: [name] Date of Service: 08/03/2022 9:45 AM Medical Record Number: [id number] Patient Account Number: [id number] Date of Birth/Sex: May 17, 1923 (88 y.o. F) Treating RN: Cornell Barman Primary Care Kally Cadden: Adrian Prows Other Clinician: Massie Kluver Referring Seanmichael Salmons: Adrian Prows Treating Youcef Klas/Extender: Skipper Cliche in Treatment: 46 Vital Signs Height(in): 62 Pulse(bpm): 60 Weight(lbs): 150 Blood Pressure(mmHg): 128/67 Body Mass Index(BMI): 27.4 Temperature(F): 98.1 Respiratory Rate(breaths/min): 18 Photos: [11:No Photos] [12:No Photos] [N/A:N/A] Wound Location: [11:Left Calcaneus] [12:Left, Lateral Ankle] [N/A:N/A] Wounding Event: [11:Pressure Injury] [12:Gradually Appeared] [N/A:N/A] Primary Etiology: [11:Pressure Ulcer] [12:Venous Leg Ulcer] [N/A:N/A] Comorbid History: [11:Cataracts, Congestive Heart Failure, Cataracts, Congestive Heart Failure, N/A Hypertension, Peripheral Arterial Disease, Osteoarthritis, Neuropathy Disease, Osteoarthritis, Neuropathy] [12:Hypertension, Peripheral Arterial] Date Acquired: [11:07/20/2022] [12:07/17/2022] [N/A:N/A] Weeks of Treatment: [11:2] [12:2] [N/A:N/A] Wound Status: [11:Open] [12:Open] [N/A:N/A] Wound Recurrence: [11:No] [12:No] [N/A:N/A] Measurements L x W x D (cm) [11:4x2.4x0.1] [12:4.6x3.8x0.2] [N/A:N/A] Area (cm) : [11:7.54] [12:13.729] [N/A:N/A] Volume (cm) : [11:0.754] [12:2.746] [N/A:N/A] % Reduction in Area: [11:76.00%] [12:-8644.60%] [N/A:N/A] % Reduction in Volume: [11:76.00%] [12:-17062.50%] [N/A:N/A] Classification: [11:Category/Stage III] [12:Full  Thickness Without Exposed Support Structures] [N/A:N/A] Exudate Amount: [11:Small] [12:Medium] [N/A:N/A] Exudate Type: [11:Serosanguineous] [12:Serosanguineous] [N/A:N/A] Exudate Color: [11:red, brown] [12:red, brown] [N/A:N/A] Granulation Amount: [11:None Present (0%)] [12:Medium (34-66%)] [N/A:N/A] Granulation Quality: [11:N/A] [12:Red] [N/A:N/A] Necrotic Amount: [11:Large (67-100%)] [12:Medium (34-66%)] [N/A:N/A] Necrotic Tissue: [11:Eschar, Adherent Slough] [12:Adherent Slough] [N/A:N/A] Exposed Structures: [11:Fat Layer (Subcutaneous Tissue): Fat Layer (Subcutaneous Tissue): N/A Yes Fascia: No Tendon: No Muscle: No Joint: No Bone: No None] [12:Yes Fascia: No Tendon: No Muscle: No Joint: No Bone: No None] [N/A:N/A] Treatment Notes Electronic Signature(s) Signed: 08/03/2022 4:44:47 PM By: Massie Kluver Entered By: Massie Kluver on 08/03/2022 10:23:20 Dibari, [name] ([id number]) -------------------------------------------------------------------------------- [place] Details Patient Name: [name] Date of Service: 08/03/2022 9:45 AM Medical Record Number: [id number] Patient Account Number: [id number] Date of Birth/Sex: 28-Jul-1923 (88 y.o. F) Treating RN: Cornell Barman Primary Care Nefertari Rebman: Adrian Prows Other Clinician: Massie Kluver Referring Suni Jarnagin: Adrian Prows Treating Annasophia Crocker/Extender: Skipper Cliche in Treatment:  63 Active Inactive Electronic Signature(s) Signed: 08/03/2022 4:44:47 PM By: Massie Kluver Signed: 08/03/2022 6:20:35 PM By: Gretta Cool, BSN, RN, CWS, Kim RN, BSN Entered By: Massie Kluver on 08/03/2022 10:23:12 Peggye, Poon [name] ([id number]) -------------------------------------------------------------------------------- Pain Assessment Details Patient Name: [name] Date of Service: 08/03/2022 9:45 AM Medical Record Number: [id number] Patient Account Number: [id number] Date of Birth/Sex: October 17, 1923 (88 y.o.  F) Treating RN: Cornell Barman Primary Care Sheri Prows: Adrian Prows Other Clinician: Massie Kluver Referring Deklynn Charlet: Adrian Prows Treating Lasheka Kempner/Extender: Skipper Cliche in Treatment: 67 Active Problems Location of Pain Severity and Description of Pain Patient Has Paino No Site Locations Pain Management and Medication Current Pain Management: Electronic Signature(s) Signed: 08/03/2022 4:44:47 PM By: Massie Kluver Signed: 08/03/2022 6:20:35 PM By: Gretta Cool, BSN, RN, CWS, Kim RN, BSN Entered By: Massie Kluver on 08/03/2022 10:11:37 Gusler, [name] ([id number]) -------------------------------------------------------------------------------- Patient/Caregiver Education Details Patient Name: [name] Date of Service: 08/03/2022 9:45 AM Medical Record Number: [id number] Patient Account Number: [id number] Date of Birth/Gender: [birth year] (88 y.o. F) Treating RN: Cornell Barman Primary Care Physician: Adrian Prows Other Clinician: Massie Kluver Referring Physician: Adrian Prows Treating Physician/Extender: Skipper Cliche in Treatment: 74 Education Assessment Education Provided To: Patient Education Topics Provided Wound/Skin Impairment: Handouts: Other: continue wound care as directed Methods: Explain/Verbal Responses: State content correctly Electronic Signature(s) Signed: 08/03/2022 4:44:47 PM By: Massie Kluver Entered By: Massie Kluver on 08/03/2022 10:50:56 Mcintire, [name] ([id number]) -------------------------------------------------------------------------------- Wound Assessment Details Patient Name: [name] Date of Service: 08/03/2022 9:45 AM Medical Record Number: [id number] Patient Account Number: [id number] Date of Birth/Sex: [birth year] (88 y.o. F) Treating RN: Cornell Barman Primary Care Dyron Kawano: Adrian Prows Other Clinician: Massie Kluver Referring Quillan Whitter: Adrian Prows Treating Modell Fendrick/Extender: Skipper Cliche in Treatment: 63 Wound Status Wound Number: 11 Primary Pressure Ulcer Etiology: Wound Location: Left Calcaneus Wound Open Wounding Event: Pressure Injury Status: Date Acquired: 07/20/2022 Comorbid Cataracts, Congestive Heart Failure, Hypertension, Weeks Of Treatment: 2 History: Peripheral Arterial Disease, Osteoarthritis, Neuropathy Clustered Wound: No Photos Wound Measurements Length: (cm) 4 Width: (cm) 2.4 Depth: (cm) 0.1 Area: (cm) 7.54 Volume: (cm) 0.754 % Reduction in Area: 76% % Reduction in Volume: 76% Epithelialization: Small (1-33%) Tunneling: No Undermining: No Wound Description Classification: Category/Stage III Exudate Amount: Small Exudate Type: Serosanguineous Exudate Color: red, brown Foul Odor After Cleansing: No Slough/Fibrino Yes Wound Bed Granulation Amount: Small (1-33%) Exposed Structure Granulation Quality: Pink Fascia Exposed: No Necrotic Amount: Large (67-100%) Fat Layer (Subcutaneous Tissue) Exposed: Yes Necrotic Quality: Eschar, Adherent Slough Tendon Exposed: No Muscle Exposed: No Joint Exposed: No Bone Exposed: No Treatment Notes Wound #11 (Calcaneus) Wound Laterality: Left Cleanser Byram Ancillary Kit - 15 Day Supply Discharge Instruction: Use supplies as instructed; Kit contains: (15) Saline Bullets; (15) 3x3 Gauze; 15 pr Gloves Peri-Wound Care Raptis, [name] ([id number]) Topical Primary Dressing Xeroform 4x4-HBD (in/in) Discharge Instruction: Apply Xeroform 4x4-HBD (in/in) as directed Secondary Dressing ABD Pad 5x9 (in/in) Discharge Instruction: Cover with ABD pad Gauze Discharge Instruction: As directed: dry, moistened with saline or moistened with Dakins Solution Secured With Conform 4'' - Conforming Stretch Gauze Bandage 4x75 (in/in) Discharge Instruction: Apply as directed Tubigrip Size D, 3x10 (in/yd) Discharge Instruction: single layer Compression Wrap Compression Stockings Add-Ons Electronic  Signature(s) Signed: 08/03/2022 4:44:47 PM By: Massie Kluver Signed: 08/03/2022 6:20:35 PM By: Gretta Cool, BSN, RN, CWS, Kim RN, BSN Entered By: Massie Kluver on 08/03/2022 10:25:32 Crehan, [name] ([id number]) -------------------------------------------------------------------------------- Wound Assessment Details Patient Name: [name] Date of Service: 08/03/2022 9:45 AM Medical Record  Number: [id number] Patient Account Number: [id number] Date of Birth/Sex: 1923/04/06 (88 y.o. F) Treating RN: Cornell Barman Primary Care Onis Markoff: Adrian Prows Other Clinician: Massie Kluver Referring Jaleal Schliep: Adrian Prows Treating Edgerrin Correia/Extender: Skipper Cliche in Treatment: 63 Wound Status Wound Number: 12 Primary Venous Leg Ulcer Etiology: Wound Location: Left, Lateral Ankle Wound Open Wounding Event: Gradually Appeared Status: Date Acquired: 07/17/2022 Comorbid Cataracts, Congestive Heart Failure, Hypertension, Weeks Of Treatment: 2 History: Peripheral Arterial Disease, Osteoarthritis, Neuropathy Clustered Wound: No Photos Wound Measurements Length: (cm) 4.6 Width: (cm) 3.8 Depth: (cm) 0.2 Area: (cm) 13.729 Volume: (cm) 2.746 % Reduction in Area: -8644.6% % Reduction in Volume: -17062.5% Epithelialization: None Wound Description Classification: Full Thickness Without Exposed Support Structures Exudate Amount: Medium Exudate Type: Serosanguineous Exudate Color: red, brown Foul Odor After Cleansing: No Slough/Fibrino Yes Wound Bed Granulation Amount: Medium (34-66%) Exposed Structure Granulation Quality: Red Fascia Exposed: No Necrotic Amount: Medium (34-66%) Fat Layer (Subcutaneous Tissue) Exposed: Yes Necrotic Quality: Adherent Slough Tendon Exposed: No Muscle Exposed: No Joint Exposed: No Bone Exposed: No Treatment Notes Wound #12 (Ankle) Wound Laterality: Left, Lateral Cleanser Byram Ancillary Kit - 15 Day Supply Discharge Instruction: Use  supplies as instructed; Kit contains: (15) Saline Bullets; (15) 3x3 Gauze; 15 pr Gloves Peri-Wound Care Plumb, [name] ([id number]) Topical Santyl Collagenase Ointment, 30 (gm), tube Discharge Instruction: apply nickel thick to wound bed only Primary Dressing Xeroform 4x4-HBD (in/in) Discharge Instruction: Apply Xeroform 4x4-HBD (in/in) as directed Secondary Dressing ABD Pad 5x9 (in/in) Discharge Instruction: Cover with ABD pad Gauze Discharge Instruction: As directed: dry, moistened with saline or moistened with Dakins Solution Secured With Medipore Tape - 68M Medipore H Soft Cloth Surgical Tape, 2x2 (in/yd) Conform 4'' - Conforming Stretch Gauze Bandage 4x75 (in/in) Discharge Instruction: Apply as directed Tubigrip Size D, 3x10 (in/yd) Discharge Instruction: single layer Compression Wrap Compression Stockings Add-Ons Electronic Signature(s) Signed: 08/03/2022 4:44:47 PM By: Massie Kluver Signed: 08/03/2022 6:20:35 PM By: Gretta Cool, BSN, RN, CWS, Kim RN, BSN Entered By: Massie Kluver on 08/03/2022 10:26:01 Devincentis, [name] ([id number]) -------------------------------------------------------------------------------- [place] Details Patient Name: [name] Date of Service: 08/03/2022 9:45 AM Medical Record Number: [id number] Patient Account Number: [id number] Date of Birth/Sex: [birth year] (88 y.o. F) Treating RN: Cornell Barman Primary Care Rohnan Bartleson: Adrian Prows Other Clinician: Massie Kluver Referring Iokepa Geffre: Adrian Prows Treating Wynonia Medero/Extender: Skipper Cliche in Treatment: 42 Vital Signs Time Taken: 10:07 Temperature (F): 98.1 Height (in): 62 Pulse (bpm): 60 Weight (lbs): 150 Respiratory Rate (breaths/min): 18 Body Mass Index (BMI): 27.4 Blood Pressure (mmHg): 128/67 Reference Range: 80 - 120 mg / dl Electronic Signature(s) Signed: 08/03/2022 4:44:47 PM By: Massie Kluver Entered By: Massie Kluver on 08/03/2022 10:11:16

## 2022-08-10 ENCOUNTER — Ambulatory Visit
Admission: RE | Admit: 2022-08-10 | Discharge: 2022-08-10 | Disposition: A | Discharge status: Home / Self Care | Payer: Medicare Other | Attending: Physician Assistant | Admitting: Physician Assistant

## 2022-08-10 ENCOUNTER — Ambulatory Visit
Admission: RE | Admit: 2022-08-10 | Discharge: 2022-08-10 | Disposition: A | Discharge status: Home / Self Care | Payer: Medicare Other | Source: Ambulatory Visit | Attending: Physician Assistant | Admitting: Physician Assistant

## 2022-08-10 ENCOUNTER — Other Ambulatory Visit: Payer: Self-pay | Admitting: Physician Assistant

## 2022-08-10 ENCOUNTER — Encounter: Payer: Medicare Other | Attending: Physician Assistant | Admitting: Physician Assistant

## 2022-08-10 DIAGNOSIS — M869 Osteomyelitis, unspecified: Secondary | ICD-10-CM | POA: Diagnosis present

## 2022-08-10 DIAGNOSIS — W19XXXA Unspecified fall, initial encounter: Secondary | ICD-10-CM | POA: Diagnosis not present

## 2022-08-10 DIAGNOSIS — S59901A Unspecified injury of right elbow, initial encounter: Secondary | ICD-10-CM | POA: Insufficient documentation

## 2022-08-10 DIAGNOSIS — L89623 Pressure ulcer of left heel, stage 3: Secondary | ICD-10-CM | POA: Insufficient documentation

## 2022-08-10 DIAGNOSIS — I739 Peripheral vascular disease, unspecified: Secondary | ICD-10-CM | POA: Insufficient documentation

## 2022-08-10 DIAGNOSIS — L97828 Non-pressure chronic ulcer of other part of left lower leg with other specified severity: Secondary | ICD-10-CM | POA: Insufficient documentation

## 2022-08-10 DIAGNOSIS — I110 Hypertensive heart disease with heart failure: Secondary | ICD-10-CM | POA: Diagnosis not present

## 2022-08-10 DIAGNOSIS — I872 Venous insufficiency (chronic) (peripheral): Secondary | ICD-10-CM | POA: Diagnosis not present

## 2022-08-10 DIAGNOSIS — I509 Heart failure, unspecified: Secondary | ICD-10-CM | POA: Diagnosis not present

## 2022-08-10 DIAGNOSIS — S8012XA Contusion of left lower leg, initial encounter: Secondary | ICD-10-CM | POA: Diagnosis not present

## 2022-08-10 DIAGNOSIS — G629 Polyneuropathy, unspecified: Secondary | ICD-10-CM | POA: Insufficient documentation

## 2022-08-10 DIAGNOSIS — I890 Lymphedema, not elsewhere classified: Secondary | ICD-10-CM | POA: Diagnosis not present

## 2022-08-10 NOTE — Progress Notes (Addendum)
DOTTIE, VAQUERANO ([id number]) Visit Report for 08/10/2022 Arrival Information Details Patient Name: ILANNA, DEIHL Date of Service: 08/10/2022 9:45 AM Medical Record Number: [id number] Patient Account Number: [id number] Date of Birth/Sex: 08-07-1923 (88 y.o. F) Treating RN: Cornell Barman Primary Care Jefferey Lippmann: Adrian Prows Other Clinician: Massie Kluver Referring Marquel Pottenger: Adrian Prows Treating Cosby Proby/Extender: Skipper Cliche in Treatment: 85 Visit Information History Since Last Visit All ordered tests and consults were completed: No Patient Arrived: [name] Added or deleted any medications: No Arrival Time: 10:32 Any new allergies or adverse reactions: No Transfer Assistance: None Had a fall or experienced change in No Patient Requires Transmission-Based No activities of daily living that may affect Precautions: risk of falls: Patient Has Alerts: Yes Hospitalized since last visit: No Patient Alerts: Patient on Blood Pain Present Now: No Thinner NOT DIABETIC ***ELIQUIS*** Electronic Signature(s) Signed: 08/10/2022 5:14:26 PM By: Massie Kluver Entered By: Massie Kluver on 08/10/2022 10:35:48 Wolken, [name] ([id number]) -------------------------------------------------------------------------------- Clinic Level of Care Assessment Details Patient Name: [name] Date of Service: 08/10/2022 9:45 AM Medical Record Number: [id number] Patient Account Number: [id number] Date of Birth/Sex: 06-01-1923 (88 y.o. F) Treating RN: Cornell Barman Primary Care Brittanny Levenhagen: Adrian Prows Other Clinician: Massie Kluver Referring Dalaney Needle: Adrian Prows Treating Kingson Lohmeyer/Extender: Skipper Cliche in Treatment: 5 Clinic Level of Care Assessment Items TOOL 4 Quantity Score _0  - Use when only an EandM is performed on FOLLOW-UP visit 0 ASSESSMENTS - Nursing Assessment / Reassessment X - Reassessment of Co-morbidities (includes updates in patient status) 1  10 X- 1 5 Reassessment of Adherence to Treatment Plan ASSESSMENTS - Wound and Skin Assessment / Reassessment _1  - Simple Wound Assessment / Reassessment - one wound 0 X- 2 5 Complex Wound Assessment / Reassessment - multiple wounds _2  - 0 Dermatologic / Skin Assessment (not related to wound area) ASSESSMENTS - Focused Assessment _3  - Circumferential Edema Measurements - multi extremities 0 _4  - 0 Nutritional Assessment / Counseling / Intervention _5  - 0 Lower Extremity Assessment (monofilament, tuning fork, pulses) _6  - 0 Peripheral Arterial Disease Assessment (using hand held doppler) ASSESSMENTS - Ostomy and/or Continence Assessment and Care _7  - Incontinence Assessment and Management 0 _8  - 0 Ostomy Care Assessment and Management (repouching, etc.) PROCESS - Coordination of Care X - Simple Patient / Family Education for ongoing care 1 15 _9  - 0 Complex (extensive) Patient / Family Education for ongoing care X- 1 10 Staff obtains Consents, Records, Test Results / Process Orders _10  - 0 Staff telephones HHA, Nursing Homes / Clarify orders / etc _11  - 0 Routine Transfer to another Facility (non-emergent condition) _12  - 0 Routine Hospital Admission (non-emergent condition) _13  - 0 New Admissions / Biomedical engineer / Ordering NPWT, Apligraf, etc. _14  - 0 Emergency Hospital Admission (emergent condition) X- 1 10 Simple Discharge Coordination _15  - 0 Complex (extensive) Discharge Coordination PROCESS - Special Needs _16  - Pediatric / Minor Patient Management 0 _17  - 0 Isolation Patient Management _18  - 0 Hearing / Language / Visual special needs _19  - 0 Assessment of Community assistance (transportation, D/C planning, etc.) _20  - 0 Additional assistance / Altered mentation _21  - 0 Support Surface(s) Assessment (bed, cushion, seat, etc.) INTERVENTIONS - Wound Cleansing / Measurement Cespedes, [name] ([id number]) _22  - 0 Simple Wound Cleansing - one wound X- 2  5 Complex Wound Cleansing - multiple wounds X- 1 5 Wound Imaging (photographs - any number of wounds) _23  - 0 Wound Tracing (instead of photographs) _24  - 0 Simple Wound Measurement -  one wound X- 2 5 Complex Wound Measurement - multiple wounds INTERVENTIONS - Wound Dressings _0  - Small Wound Dressing one or multiple wounds 0 X- 2 15 Medium Wound Dressing one or multiple wounds _1  - 0 Large Wound Dressing one or multiple wounds X- 1 5 Application of Medications - topical <[id number]>_3<\/UKGURKYHCWCBJSEG>_3  - 0 Application of Medications - injection INTERVENTIONS - Miscellaneous _3  - External ear exam 0 _4  - 0 Specimen Collection (cultures, biopsies, blood, body fluids, etc.) _5  - 0 Specimen(s) / Culture(s) sent or taken to Lab for analysis _6  - 0 Patient Transfer (multiple staff / Civil Service fast streamer / Similar devices) _7  - 0 Simple Staple / Suture removal (25 or less) _8  - 0 Complex Staple / Suture removal (26 or more) _9  - 0 Hypo / Hyperglycemic Management (close monitor of Blood Glucose) _10  - 0 Ankle / Brachial Index (ABI) - do not check if billed separately X- 1 5 Vital Signs Has the patient been seen at the hospital within the last three years: Yes Total Score: 125 Level Of Care: New/Established - Level 4 Electronic Signature(s) Signed: 08/10/2022 5:14:26 PM By: Massie Kluver Entered By: Massie Kluver on 08/10/2022 11:20:57 Terral, [name] ([id number]) -------------------------------------------------------------------------------- Encounter Discharge Information Details Patient Name: [name] Date of Service: 08/10/2022 9:45 AM Medical Record Number: [id number] Patient Account Number: [id number] Date of Birth/Sex: [birth year] (88 y.o. F) Treating RN: Cornell Barman Primary Care Carey Lafon: Adrian Prows Other Clinician: Massie Kluver Referring Akira Adelsberger: Adrian Prows Treating Rawleigh Rode/Extender: Skipper Cliche in Treatment: 40 Encounter Discharge Information Items Discharge  Condition: Stable Ambulatory Status: Walker Discharge Destination: Home Transportation: Private Auto Accompanied By: daughter Schedule Follow-up Appointment: Yes Clinical Summary of Care: Electronic Signature(s) Signed: 08/10/2022 5:14:26 PM By: Massie Kluver Entered By: Massie Kluver on 08/10/2022 14:54:31 [place], [name] ([id number]) -------------------------------------------------------------------------------- Lower Extremity Assessment Details Patient Name: [name] Date of Service: 08/10/2022 9:45 AM Medical Record Number: [id number] Patient Account Number: [id number] Date of Birth/Sex: 01-09-1923 (88 y.o. F) Treating RN: Cornell Barman Primary Care Trudee Chirino: Adrian Prows Other Clinician: Massie Kluver Referring Radford Pease: Adrian Prows Treating Austine Wiedeman/Extender: Skipper Cliche in Treatment: 64 Edema Assessment Assessed: [Left: Yes] [Right: No] Edema: [Left: Ye] [Right: s] Calf Left: Right: Point of Measurement: 33 cm From Medial Instep 26.5 cm Ankle Left: Right: Point of Measurement: 12 cm From Medial Instep 18.2 cm Vascular Assessment Pulses: Dorsalis Pedis Palpable: [Left:Yes] Electronic Signature(s) Signed: 08/10/2022 4:58:49 PM By: Gretta Cool, BSN, RN, CWS, Kim RN, BSN Signed: 08/10/2022 5:14:26 PM By: Massie Kluver Entered By: Massie Kluver on 08/10/2022 10:53:33 Reever, [name] ([id number]) -------------------------------------------------------------------------------- Multi Wound Chart Details Patient Name: [name] Date of Service: 08/10/2022 9:45 AM Medical Record Number: [id number] Patient Account Number: [id number] Date of Birth/Sex: 26-Mar-1923 (88 y.o. F) Treating RN: Cornell Barman Primary Care Mirabel Ahlgren: Adrian Prows Other Clinician: Massie Kluver Referring Ralston Venus: Adrian Prows Treating Amoreena Neubert/Extender: Skipper Cliche in Treatment: 45 Vital Signs Height(in): 61 Pulse(bpm): 60 Weight(lbs): 150 Blood  Pressure(mmHg): 130/72 Body Mass Index(BMI): 27.4 Temperature(F): 98.0 Respiratory Rate(breaths/min): 18 Photos: [N/A:N/A] Wound Location: Left Calcaneus Left, Lateral Ankle N/A Wounding Event: Pressure Injury Gradually Appeared N/A Primary Etiology: Pressure Ulcer Venous Leg Ulcer N/A Comorbid History: Cataracts, Congestive Heart Failure, Cataracts, Congestive Heart Failure, N/A Hypertension, Peripheral Arterial Hypertension, Peripheral Arterial Disease, Osteoarthritis, Neuropathy Disease, Osteoarthritis, Neuropathy Date Acquired: 07/20/2022 07/17/2022 N/A Weeks of Treatment: 3 3 N/A Wound Status: Open Open N/A Wound Recurrence: No No N/A Measurements L x W x D (cm) 3.9x2x0.1 4.6x2x0.2 N/A Area (cm) :  6.126 7.226 N/A Volume (cm) : 0.613 1.445 N/A % Reduction in Area: 80.50% -4502.50% N/A % Reduction in Volume: 80.50% -8931.20% N/A Classification: Category/Stage III Full Thickness Without Exposed N/A Support Structures Exudate Amount: Small Medium N/A Exudate Type: Serosanguineous Serosanguineous N/A Exudate Color: red, brown red, brown N/A Granulation Amount: Small (1-33%) Medium (34-66%) N/A Granulation Quality: Pink Red N/A Necrotic Amount: Large (67-100%) Medium (34-66%) N/A Necrotic Tissue: Eschar N/A N/A Exposed Structures: Fat Layer (Subcutaneous Tissue): Fat Layer (Subcutaneous Tissue): N/A Yes Yes Fascia: No Fascia: No Tendon: No Tendon: No Muscle: No Muscle: No Joint: No Joint: No Bone: No Bone: No Epithelialization: Small (1-33%) None N/A Treatment Notes Electronic Signature(s) Signed: 08/10/2022 5:14:26 PM By: Massie Kluver Entered By: Massie Kluver on 08/10/2022 10:53:46 [place], [name] ([id number]) Joni Fears, [name] ([id number]) -------------------------------------------------------------------------------- [place] Details Patient Name: [name] Date of Service: 08/10/2022 9:45 AM Medical Record Number:  [id number] Patient Account Number: [id number] Date of Birth/Sex: 1923-02-06 (88 y.o. F) Treating RN: Cornell Barman Primary Care Valita Righter: Adrian Prows Other Clinician: Massie Kluver Referring Owenn Rothermel: Adrian Prows Treating Tjay Velazquez/Extender: Skipper Cliche in Treatment: 53 Active Inactive Electronic Signature(s) Signed: 08/10/2022 4:58:49 PM By: Gretta Cool BSN, RN, CWS, Kim RN, BSN Signed: 08/10/2022 5:14:26 PM By: Massie Kluver Entered By: Massie Kluver on 08/10/2022 10:53:39 [name], [name] ([id number]) -------------------------------------------------------------------------------- Pain Assessment Details Patient Name: [name] Date of Service: 08/10/2022 9:45 AM Medical Record Number: [id number] Patient Account Number: [id number] Date of Birth/Sex: 1923/07/04 (88 y.o. F) Treating RN: Cornell Barman Primary Care Khoury Siemon: Adrian Prows Other Clinician: Massie Kluver Referring Shirlene Andaya: Adrian Prows Treating Jaxxen Voong/Extender: Skipper Cliche in Treatment: 54 Active Problems Location of Pain Severity and Description of Pain Patient Has Paino No Site Locations Pain Management and Medication Current Pain Management: Electronic Signature(s) Signed: 08/10/2022 4:58:49 PM By: Gretta Cool, BSN, RN, CWS, Kim RN, BSN Signed: 08/10/2022 5:14:26 PM By: Massie Kluver Entered By: Massie Kluver on 08/10/2022 10:38:20 [place], [name] ([id number]) -------------------------------------------------------------------------------- Patient/Caregiver Education Details Patient Name: [name] Date of Service: 08/10/2022 9:45 AM Medical Record Number: [id number] Patient Account Number: [id number] Date of Birth/Gender: 03-07-1923 (88 y.o. F) Treating RN: Cornell Barman Primary Care Physician: Adrian Prows Other Clinician: Massie Kluver Referring Physician: Adrian Prows Treating Physician/Extender: Skipper Cliche in Treatment: 1 Education  Assessment Education Provided To: Patient and Caregiver daughter Education Topics Provided Infection: Handouts: Other: discussed starting new antibiotic Methods: Explain/Verbal Responses: State content correctly Wound/Skin Impairment: Handouts: Other: continue wound care as directed Electronic Signature(s) Signed: 08/10/2022 5:14:26 PM By: Massie Kluver Entered By: Massie Kluver on 08/10/2022 14:51:47 Archila, [name] ([id number]) -------------------------------------------------------------------------------- Wound Assessment Details Patient Name: [name] Date of Service: 08/10/2022 9:45 AM Medical Record Number: [id number] Patient Account Number: [id number] Date of Birth/Sex: 03-26-1923 (88 y.o. F) Treating RN: Cornell Barman Primary Care Brandilee Pies: Adrian Prows Other Clinician: Massie Kluver Referring Jeannene Tschetter: Adrian Prows Treating Laina Guerrieri/Extender: Skipper Cliche in Treatment: 64 Wound Status Wound Number: 11 Primary Pressure Ulcer Etiology: Wound Location: Left Calcaneus Wound Open Wounding Event: Pressure Injury Status: Date Acquired: 07/20/2022 Comorbid Cataracts, Congestive Heart Failure, Hypertension, Weeks Of Treatment: 3 History: Peripheral Arterial Disease, Osteoarthritis, Neuropathy Clustered Wound: No Photos Wound Measurements Length: (cm) 3.9 Width: (cm) 2 Depth: (cm) 0.1 Area: (cm) 6.126 Volume: (cm) 0.613 % Reduction in Area: 80.5% % Reduction in Volume: 80.5% Epithelialization: Small (1-33%) Wound Description Classification: Category/Stage III Exudate Amount: Small Exudate Type: Serosanguineous Exudate Color: red, brown Foul Odor After Cleansing: No Slough/Fibrino Yes Wound  Bed Granulation Amount: Small (1-33%) Exposed Structure Granulation Quality: Pink Fascia Exposed: No Necrotic Amount: Large (67-100%) Fat Layer (Subcutaneous Tissue) Exposed: Yes Necrotic Quality: Eschar Tendon Exposed: No Muscle Exposed:  No Joint Exposed: No Bone Exposed: No Treatment Notes Wound #11 (Calcaneus) Wound Laterality: Left Cleanser Byram Ancillary Kit - 15 Day Supply Discharge Instruction: Use supplies as instructed; Kit contains: (15) Saline Bullets; (15) 3x3 Gauze; 15 pr Gloves Peri-Wound Care Mawson, [name] ([id number]) Topical Santyl Collagenase Ointment, 30 (gm), tube Discharge Instruction: apply nickel thick to wound bed only Primary Dressing Xeroform 4x4-HBD (in/in) Discharge Instruction: Apply Xeroform 4x4-HBD (in/in) as directed Secondary Dressing ABD Pad 5x9 (in/in) Discharge Instruction: Cover with ABD pad Gauze Discharge Instruction: As directed: dry, moistened with saline or moistened with Dakins Solution Secured With Conform 4'' - Conforming Stretch Gauze Bandage 4x75 (in/in) Discharge Instruction: Apply as directed Tubigrip Size D, 3x10 (in/yd) Discharge Instruction: single layer Compression Wrap Compression Stockings Add-Ons Electronic Signature(s) Signed: 08/10/2022 4:58:49 PM By: Gretta Cool, BSN, RN, CWS, Kim RN, BSN Signed: 08/10/2022 5:14:26 PM By: Massie Kluver Entered By: Massie Kluver on 08/10/2022 10:52:26 [place], [name] ([id number]) -------------------------------------------------------------------------------- Wound Assessment Details Patient Name: [name] Date of Service: 08/10/2022 9:45 AM Medical Record Number: [id number] Patient Account Number: [id number] Date of Birth/Sex: Apr 16, 1923 (88 y.o. F) Treating RN: Cornell Barman Primary Care Batya Citron: Adrian Prows Other Clinician: Massie Kluver Referring Maisha Bogen: Adrian Prows Treating Galina Haddox/Extender: Skipper Cliche in Treatment: 64 Wound Status Wound Number: 12 Primary Venous Leg Ulcer Etiology: Wound Location: Left, Lateral Ankle Wound Open Wounding Event: Gradually Appeared Status: Date Acquired: 07/17/2022 Comorbid Cataracts, Congestive Heart Failure, Hypertension, Weeks Of  Treatment: 3 History: Peripheral Arterial Disease, Osteoarthritis, Neuropathy Clustered Wound: No Photos Wound Measurements Length: (cm) 4.6 Width: (cm) 2 Depth: (cm) 0.2 Area: (cm) 7.226 Volume: (cm) 1.445 % Reduction in Area: -4502.5% % Reduction in Volume: -8931.2% Epithelialization: None Wound Description Classification: Full Thickness Without Exposed Support Structures Exudate Amount: Medium Exudate Type: Serosanguineous Exudate Color: red, brown Foul Odor After Cleansing: No Slough/Fibrino Yes Wound Bed Granulation Amount: Medium (34-66%) Exposed Structure Granulation Quality: Red Fascia Exposed: No Necrotic Amount: Medium (34-66%) Fat Layer (Subcutaneous Tissue) Exposed: Yes Tendon Exposed: No Muscle Exposed: No Joint Exposed: No Bone Exposed: No Treatment Notes Wound #12 (Ankle) Wound Laterality: Left, Lateral Cleanser Byram Ancillary Kit - 15 Day Supply Discharge Instruction: Use supplies as instructed; Kit contains: (15) Saline Bullets; (15) 3x3 Gauze; 15 pr Gloves Peri-Wound Care RANDE, DARIO ([id number]) Topical Santyl Collagenase Ointment, 30 (gm), tube Discharge Instruction: apply nickel thick to wound bed only Primary Dressing Xeroform 4x4-HBD (in/in) Discharge Instruction: Apply Xeroform 4x4-HBD (in/in) as directed Secondary Dressing ABD Pad 5x9 (in/in) Discharge Instruction: Cover with ABD pad Gauze Discharge Instruction: As directed: dry, moistened with saline or moistened with Dakins Solution Secured With Medipore Tape - 77M Medipore H Soft Cloth Surgical Tape, 2x2 (in/yd) Conform 4'' - Conforming Stretch Gauze Bandage 4x75 (in/in) Discharge Instruction: Apply as directed Tubigrip Size D, 3x10 (in/yd) Discharge Instruction: single layer Compression Wrap Compression Stockings Add-Ons Electronic Signature(s) Signed: 08/10/2022 4:58:49 PM By: Gretta Cool, BSN, RN, CWS, Kim RN, BSN Signed: 08/10/2022 5:14:26 PM By: Massie Kluver Entered By:  Massie Kluver on 08/10/2022 10:52:27 [place], [name] ([id number]) -------------------------------------------------------------------------------- [place] Details Patient Name: [name] Date of Service: 08/10/2022 9:45 AM Medical Record Number: [id number] Patient Account Number: [id number] Date of Birth/Sex: 1923/10/20 (88 y.o. F) Treating RN: Cornell Barman Primary Care Bethel Sirois: Adrian Prows Other Clinician: Massie Kluver Referring  Violet Seabury: Adrian Prows Treating Estefan Pattison/Extender: Skipper Cliche in Treatment: 64 Vital Signs Time Taken: 10:37 Temperature (F): 98.0 Height (in): 62 Pulse (bpm): 60 Weight (lbs): 150 Respiratory Rate (breaths/min): 18 Body Mass Index (BMI): 27.4 Blood Pressure (mmHg): 130/72 Reference Range: 80 - 120 mg / dl Electronic Signature(s) Signed: 08/10/2022 5:14:26 PM By: Massie Kluver Entered By: Massie Kluver on 08/10/2022 10:38:13

## 2022-08-10 NOTE — Progress Notes (Signed)
[name] ([id number]) Visit Report for 08/10/2022 Chief Complaint Document Details Patient Name: [name], [name]. Date of Service: 08/10/2022 9:45 AM Medical Record Number: [id number] Patient Account Number: [id number] Date of Birth/Sex: 15-Dec-1922 (88 y.o. F) Treating RN: Cornell Barman Primary Care Provider: Adrian Prows Other Clinician: Massie Kluver Referring Provider: Adrian Prows Treating Provider/Extender: Skipper Cliche in Treatment: 67 Information Obtained from: Patient Chief Complaint Left leg ulcer and left heel blister/deep tissue injury Electronic Signature(s) Signed: 08/10/2022 10:08:53 AM By: Worthy Keeler PA-C Entered By: Worthy Keeler on 08/10/2022 10:08:52 [name], [name] ([id number]) -------------------------------------------------------------------------------- HPI Details Patient Name: [name] Date of Service: 08/10/2022 9:45 AM Medical Record Number: [id number] Patient Account Number: [id number] Date of Birth/Sex: June 17, 1923 (88 y.o. 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 Dr. Amalia Hailey the podiatrist saw her on 07/20/2017 and sent her to the wound center for possible hyperbaric oxygen therapy. past medical history of peripheral vascular disease, varicose veins, status post appendectomy, basal cell carcinoma excision from the left leg, cholecystectomy, pacemaker placement, right lower extremity angiography done by Dr. dew in March 2017 with placement of a stent. there is also note of a successful ablation of the right small saphenous  vein done which was reviewed by ultrasound on 10/24/2016. the patient had a right small saphenous vein ablation done on 10/20/2016. The patient has never been a smoker. She has been seen by Dr. Corene Cornea dew the vascular surgeon who most recently saw her on 06/15/2017 for evaluation of ongoing problems with right leg swelling. She had a lower extremity arterial duplex examination done(02/13/17) which showed patent distal right superficial femoral artery stent and above-the-knee popliteal stent without evidence of restenosis. The ABI was more than 1.3 on the right and more than 1.3 on the left. This was consistent with noncompressible arteries due to medial calcification. The right great toe pressure and PPG waveforms are within normal limits and the left great toe pressure and PPG waveforms are decreased. he recommended she continue to wear her compression stockings and continue with elevation. She is scheduled to have a noninvasive arterial study in the near future 08/16/2017 -- had a lower extremity arterial duplex examination done which showed patent distal right superficial femoral artery stent and above-the-knee popliteal stent without evidence of restenosis. The ABI was more than 1.3 on the right and more than 1.3 on the left. This was consistent with noncompressible arteries due to medial calcification. The right great toe pressure and PPG waveforms are within normal limits and the left great toe pressure and PPG waveforms are decreased. the x-ray of the right ankle has not yet been done 08/24/2017 -- had a right ankle x-ray -- IMPRESSION:1. No fracture, bone lesion or evidence of osteomyelitis. 2. Lateral soft tissue swelling with a soft tissue ulcer. she has not yet seen the vascular surgeon for review 08/31/17 on evaluation today patient's wound appears to be showing signs of improvement. She still with her appointment with vascular in order to review her results of her vascular study and then  determine if any intervention would be recommended at that time. No fevers, chills, nausea, or vomiting noted at this time. She has been tolerating  the dressing changes without complication. 09/28/17 on evaluation today patient's wound appears to show signs of good improvement in regard to the granulation tissue which is surfacing. There is still a layer of slough covering the wound and the posterior portion is still significantly deeper than the anterior nonetheless there has been some good sign of things moving towards the better. She is going to go back to Dr. dew for reevaluation to ensure her blood flow is still appropriate. That will be before her next evaluation with [place] next week. No fevers, chills, nausea, or vomiting noted at this time. Patient does have some discomfort rated to be a 3-4/10 depending on activity specifically cleansing the wound makes it worse. 10/05/2017 -- the patient was seen by Dr. Lucky Cowboy last week and noninvasive studies showed a normal right ABI with brisk triphasic waveforms consistent with no arterial insufficiency including normal digital pressures. The duplex showed a patent distal right SFA stent and the proximal SFA was also normal. He was pleased with her test and thought she should have enough of perfusion for normal wound healing. He would see her back in 6 months time. 12/21/17 on evaluation today patient appears to be doing fairly well in regard to her right lateral ankle wound. Unfortunately the main issue that she is expansion at this point is that she is having some issues with what appears to be some cellulitis in the right anterior shin. She has also been noting a little bit of uncomfortable feeling especially last night and her ankle area. I'm afraid that she made the developing a little bit of an infection. With that being said I think it is in the early stages. 12/28/17 on evaluation today patient's ankle appears to be doing excellent. She's making good  progress at this point the cellulitis seems to have improved after last week's evaluation. Overall she is having no significant discomfort which is excellent news. She does have an appointment with Dr. dew on March 29, 2018 for reevaluation in regard to the stent he placed. She seems to have excellent blood flow in the right lower extremity. 01/19/12 on evaluation today patient's wound appears to be doing very well. In fact she does not appear to require debridement at this point, there's no evidence of infection, and overall from the standpoint of the wound she seems to be doing very well. With that being said I believe that it may be time to switch to different dressing away from the [facility] Dressing she tells me she does have a lot going on her friend actually passed away yesterday and she's also having a lot of issues with her husband this obviously is weighing heavy on her as far as your thoughts and concerns today. 01/25/18 on evaluation today patient appears to be doing fairly well in regard to her right lateral malleolus. She has been tolerating the dressing changes without complication. Overall I feel like this is definitely showing signs of improvement as far as how the overall appearance of the wound is there's also evidence of epithelium start to migrate over the granulation tissue. In general I think that she is progressing nicely as far as the wound is concerned. The only concern she really has is whether or not we can switch to every other week visits in order to avoid having as many appointments as her daughters have a difficult time getting her to her appointments as well as the patient's husband to his he is not doing very well at this  point. 02/22/18 on evaluation today patient's right lateral malleolus ulcer appears to be doing great. She has been tolerating the dressing changes without complication. Overall you making excellent progress at this time. Patient is having no  significant discomfort. [name], [name] ([id number]) 03/15/18 on evaluation today patient appears to be doing much more poorly in regard to her right lateral ankle ulcer at this point. Unfortunately since have last seen her her husband has passed just a few days ago is obviously weighed heavily on her her daughter also had surgery well she is with her today as usual. There does not appear to be any evidence of infection she does seem to have significant contusion/deep tissue injury to the right lateral malleolus which was not noted previous when I saw her last. It's hard to tell of exactly when this injury occurred although during the time she was spending the night in the hospital this may have been most likely. 03/22/18 on evaluation today patient appears to actually be doing very well in regard to her ulcer. She did unfortunately have a setback which was noted last week however the good news is we seem to be getting back on track and in fact the wound in the core did still have some necrotic tissue which will be addressed at this point today but in general I'm seeing signs that things are on the up and up. She is glad to hear this obviously she's been somewhat concerned that due to the how her wound digressed more recently. 03/29/18 on evaluation today patient appears to be doing fairly well in regard to her right lower extremity lateral malleolus ulcer. She unfortunately does have a new area of pressure injury over the inferior portion where the wound has opened up a little bit larger secondary to the pressure she seems to be getting. She does tell me sometimes when she sleeps at night that it actually hurts and does seem to be pushing on the area little bit more unfortunately. There does not appear to be any evidence of infection which is good news. She has been tolerating the dressing changes without complication. She also did have some bruising in the left second and third toes due to the fact  that she may have bump this or injured it although she has neuropathy so she does not feel she did move recently that may have been where this came from. Nonetheless there does not appear to be any evidence of infection at this time. 04/12/18 on evaluation today patient's wound on the right lateral ankle actually appears to be doing a little bit better with a lot of necrotic docking tissue centrally loosening up in clearing away. However she does have the beginnings of a deep tissue injury on the left lateral malleolus likely due to the fact we've been trying offload the right as much as we have. I think she may benefit from an assistive soft device to help with offloading and it looks like they're looking at one of the doughnut conditions that wraps around the lower leg to offload which I think will definitely do a good job. With that being said I think we definitely need to address this issue on the left before it becomes a wound. Patient is not having significant pain. 04/19/18 on evaluation today patient appears to be doing excellent in regard to the progress she's made with her right lateral ankle ulcer. The left ankle region which did show evidence of a deep tissue injury seems to be  resolving there's little fluid noted underneath and a blister there's nothing open at this point in time overall I feel like this is progressing nicely which is good news. She does not seem to be having significant discomfort at this point which is also good news. 04/25/18-She is here in follow up evaluation for bilateral lateral malleolar ulcers. The right lateral malleolus ulcer with pale subcutaneous tissue exposure, central area of ulcer with tendon/periosteum exposed. The left lateral malleolus ulcer now with central area of nonviable tissue, otherwise deep tissue injury. She is wearing compression wraps to the left lower extremity, she will place the right lower extremity compression wraps on when she gets home.  She will be out of town over the weekend and return next week and follow-up appointment. She completed her doxycycline this morning 05/03/18 on evaluation today patient appears to be doing very well in regard to her right lateral ankle ulcer in general. At least she's showing some signs of improvement in this regard. Unfortunately she has some additional injury to the left lateral malleolus region which appears to be new likely even over the past several days. Again this determination is based on the overall appearance. With that being said the patient is obviously frustrated about this currently. 05/10/18-She is here in follow-up evaluation for bilateral lateral malleolar ulcers. She states she has purchased offloading shoes/boots and they will arrive tomorrow. She was asked to bring them in the office at next week's appointment so her provider is aware of product being utilized. She continues to sleep on right or left side, she has been encouraged to sleep on her back. The right lateral malleolus ulcer is precariously close to peri-osteum; will order xray. The left lateral malleolus ulcer is improved. Will switch back to santyl; she will follow up next week. 05/17/18 on evaluation today patient actually appears to be doing very well in regard to her malleolus her ulcers compared to last time I saw them. She does not seem to have as much in the way of contusion at this point which is great news. With that being said she does continue to have discomfort and I do believe that she is still continuing to benefit from the offloading/pressure reducing boots that were recommended. I think this is the key to trying to get this to heal up completely. 05/24/18 on evaluation today patient actually appears to be doing worse at this point in time unfortunately compared to her last week's evaluation. She is having really no increased pain which is good news unfortunately she does have more maceration in your theme and  noted surrounding the right lateral ankle the left lateral ankle is not really is erythematous I do not see signs of the overt cellulitis on that side. Unfortunately the wounds do not seem to have shown any signs of improvement since the last evaluation. She also has significant swelling especially on the right compared to previous some of this may be due to infection however also think that she may be served better while she has these wounds by compression wrapping versus continuing to use the Juxta-Lite for the time being. Especially with the amount of drainage that she is experiencing at this point. No fevers, chills, nausea, or vomiting noted at this time. 05/31/18 on evaluation today patient appears to actually be doing better in regard to her right lateral lower extremity ulcer specifically on the malleolus region. She has been tolerating the antibiotic without complication. With that being said she still continues to have  issues but a little bit of redness although nothing like she what she was experiencing previous. She still continues to pressure to her ankle area she did get the problem on offloading boots unfortunately she will not wear them she states there too uncomfortable and she can't get in and out of the bed. Nonetheless at this point her wounds seem to be continually getting worse which is not what we want I'm getting somewhat concerned about her progress and how things are going to proceed if we do not intervene in some way shape or form. I therefore had a very lengthy conversation today about offloading yet again and even made a specific suggestion for switching her to a memory foam mattress and even gave the information for a specific one that they could look at getting if it was something that they were interested in considering. She does not want to be considered for a hospital bed air mattress although honestly insurance would not cover it that she does not have any wounds on  her trunk. 06/14/18 on evaluation today both wounds over the bilateral lateral malleolus her ulcers appear to be doing better there's no evidence of pressure injury at this point. She did get the foam mattress for her bed and this does seem to have been extremely beneficial for her in my pinion. Her daughter states that she is having difficulty getting out of bed because of how soft it is. The patient also relates this to be. Nonetheless I do feel like she's actually doing better. Unfortunately right after and around the time she was getting the mattress she also sustained a fall when she got up to go pick up the phone and ended up injuring her right elbow she has 18 sutures in place. We are not caring for this currently although home health is going to be taking the sutures out shortly. Nonetheless this may be something that we need to evaluate going forward. It depends on how well it has or has not healed in the end. She also recently saw an orthopedic specialist for an injection in the right shoulder just before her fall unfortunately the fall seems to have worsened her pain. 06/21/18 on evaluation today patient appears to be doing about the same in regard to her lateral malleolus ulcers. Both appear to be just a little bit deeper but again we are clinging away the necrotic and dead tissue which I think is why this is progressing towards a deeper realm as opposed [name], [name]. ([id number]) to improving from my measurement standpoint in that regard. Nonetheless she has been tolerating the dressing changes she absolutely hates the memory foam mattress topper that was obtained for her nonetheless I do believe this is still doing excellent as far as taking care of excess pressure in regard to the lateral malleolus regions. She in fact has no pressure injury that I see whereas in weeks past it was week by week I was constantly seeing new pressure injuries. Overall I think it has been very beneficial  for her. 07/03/18; patient arrives in my clinic today. She has deep punched out areas over her bilateral lateral malleoli. The area on the right has some more depth. We spent a lot of time today talking about pressure relief for these areas. This started when her daughter asked for a prescription for a memory foam mattress. I have never written a prescription for a mattress and I don't think insurances would pay for that on an ordinary bed.  In any case he came up that she has foam boots that she refuses to wear. I would suggest going to these before any other offloading issues when she is in bed. They say she is meticulous about offloading this the rest of the day 07/10/18- She is seen in follow-up evaluation for bilateral, lateral malleolus ulcers. There is no improvement in the ulcers. She has purchased and is sleeping on a memory foam mattress/overlay, she has been using the offloading boots nightly over the past week. She has a follow up appointment with vascular medicine at the end of October, in my opinion this follow up should be expedited given her deterioration and suboptimal TBI results. We will order plain film xray of the left ankle as deeper structures are palpable; would consider having MRI, regardless of xray report(s). The ulcers will be treated with iodoflex/iodosorb, she is unable to safely change the dressings daily with santyl. 07/19/18 on evaluation today patient appears to be doing in general visually well in regard to her bilateral lateral malleolus ulcers. She has been tolerating the dressing changes without complication which is good news. With that being said we did have an x-ray performed on 07/12/18 which revealed a slight loosen see in the lateral portion of the distal left fibula which may represent artifact but underline lytic destruction or osteomyelitis could not be excluded. MRI was recommended. With that being said we can see about getting the patient scheduled for an MRI  to further evaluate this area. In fact we have that scheduled currently for August 20 19,019. 07/26/18 on evaluation today patient's wound on the right lateral ankle actually appears to be doing fairly well at this point in my pinion. She has made some good progress currently. With that being said unfortunately in regard to the left lateral ankle ulcer this seems to be a little bit more problematic at this time. In fact as I further evaluated the situation she actually had bone exposed which is the first time that's been the case in the bone appear to be necrotic. Currently I did review patient's note from Dr. Bunnie Domino office with [facility]. He stated that ABI was 1.26 on the right and 0.95 on the left with good waveforms. Her perfusion is stable not reduced from previous studies and her digital waveforms were pretty good particularly on the right. His conclusion upon review of the note was that there was not much she could do to improve her perfusion and he felt she was adequate for wound healing. His suggestion was that she continued to see [place] and consider a synthetic skin graft if there was no underlying infection. He plans to see her back in six months or as needed. 08/01/18 on evaluation today patient appears to be doing better in regard to her right lateral ankle ulcer. Her left lateral ankle ulcer is about the same she still has bone involvement in evidence of necrosis. There does not appear to be evidence of infection at this time On the right lateral lower extremity. I have started her on the Augmentin she picked this up and started this yesterday. This is to get her through until she sees infectious disease which is scheduled for 08/12/18. 08/06/18 on evaluation today patient appears to be doing rather well considering my discussion with patient's daughter at the end of last week. The area which was marked where she had erythema seems to be improved and this is good news.  With that being said overall  the patient seems to be making good improvement when it comes to the overall appearance of the right lateral ankle ulcer although this has been slow she at least is coming around in this regard. Unfortunately in regard to the left lateral ankle ulcer this is osteomyelitis based on the pathology report as well is bone culture. Nonetheless we are still waiting CT scan. Unfortunately the MRI we originally ordered cannot be performed as the patient is a pacemaker which I had overlooked. Nonetheless we are working on the CT scan approval and scheduling as of now. She did go to the hospital over the weekend and was placed on IV Cefzo for a couple of days. Fortunately this seems to have improved the erythema quite significantly which is good news. There does not appear to be any evidence of worsening infection at this time. She did have some bleeding after the last debridement therefore I did not perform any sharp debridement in regard to left lateral ankle at this point. Patient has been approved for a snap vac for the right lateral ankle. 08/14/18; the patient with wounds over her bilateral lateral malleoli. The area on the right actually looks quite good. Been using a snap back on this area. Healthy granulation and appears to be filling in. Unfortunately the area on the left is really problematic. 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 Also noted to have cellulitis. She saw Dr. Novella Olive of infectious disease today and was put on linezolid. We are able to verify this with her pharmacy. She is completed the Augmentin that she was already on. We've been using Iodoflex to this area 08/23/18 on evaluation today patient's wounds both actually appear to be doing better compared to my prior evaluations. Fortunately she showing signs of good improvement in regard to the overall wound status especially  where were using the snap vac on the right. In regard to left lateral malleolus the wound bed actually appears to be much cleaner than previously noted. I do not feel any phone directly probed during evaluation today and though there is tendon noted this does not appear to be necrotic it's actually fairly good as far as the overall appearance of the tendon is concerned. In general the wound bed actually appears to be doing significantly better than it was previous. Patient is currently in the care of [name] and I did review that note today. He actually has her on two weeks of linezolid and then following the patient will be on 1-2 months of Keflex. That is the plan currently. She has been on antibiotics therapy as prescribed by myself initially starting on July 30, 2018 and has been on that continuously up to this point. 08/30/18 on evaluation today patient actually appears to be doing much better in regard to her right lateral malleolus ulcer. She has been tolerating the dressing changes specifically the snap vac without complication although she did have some issues with the seal currently. Apparently there was some trouble with getting it to maintain over the past week past Sunday. Nonetheless overall the wound appears better in regard to the right lateral malleolus region. In regard to left lateral malleolus this actually show some signs of additional granulation although there still tendon noted in the base of the wound this appears to be healthy not necrotic in any way whatsoever. We are considering potentially using a snap vac for the left lateral malleolus as  well the product wrap from [employer], [place], was present in the clinic today we're going to see this patient I did have her come in with me after obtaining consent from the patient and her daughter in order to look at the wound and see if there's any recommendation one way or another as to whether or not they felt the snapback could be  beneficial for the left lateral malleolus region. But the conclusion was that it might be but that this is definitely a little bit deeper wound than what traditionally would be utilized for a snap vac. 09/06/18 on evaluation today patient actually appears to be doing excellent in my pinion in regard to both ankle ulcers. She has been tolerating the dressing changes without complication which is great news. Specifically we have been using the snap vac. In regard to the right ankle I'm not even sure that this is going to be necessary for today and following as the wound has filled in quite nicely. In regard to the left ankle I do believe [name], [name] ([id number]) that we're seeing excellent epithelialization from the edge as well as granulation in the central portion the tendon is still exposed but there's no evidence of necrotic bone and in general I feel like the patient has made excellent progress even compared to last week with just one week of the snap vac. 09/11/18; this is a patient who has wounds on her bilateral lateral malleoli. Initially both of these were deep stage IV wounds in the setting of chronic arterial insufficiency. She has been revascularized. As I understand think she been using snap vacs to both of these wounds however the area on the right became more superficial and currently she is only using it on the left. Using silver collagen on the right and silver collagen under the back on the left I believe 09/19/18 on evaluation today patient actually appears to be doing very well in regard to her lateral malleolus or ulcers bilaterally. She has been tolerating the dressing changes without complication. Fortunately there does not appear to be any evidence of infection at this time. Overall I feel like she is improving in an excellent manner and I'm very pleased with the fact that everything seems to be turning towards the better for her. This has obviously been a long  road. 09/27/18 on evaluation today patient actually appears to be doing very well in regard to her bilateral lateral malleolus ulcers. She has been tolerating the dressing changes without complication. Fortunately there does not appear to be any evidence of infection at this time which is also great news. No fevers, chills, nausea, or vomiting noted at this time. Overall I feel like she is doing excellent with the snap vac on the left malleolus. She had 40 mL of fluid collection over the past week. 10/04/18 on evaluation today patient actually appears to be doing well in regard to her bilateral lateral malleolus ulcers. She continues to tolerate the dressing changes without complication. One issue that I see is the snap vac on the left lateral malleolus which appears to have sealed off some fluid underlying this area and has not really allowed it to heal to the degree that I would like to see. For that reason I did suggest at this point we may want to pack a small piece of packing strip into this region to allow it to more effectively wick out fluid. 10/11/18 in general the patient today does not feel that she has been  doing very well. She's been a little bit lethargic and subsequently is having bodyaches as well according to what she tells me today. With that being said overall she has been concerned with the fact that something may be worsening although to be honest her wounds really have not been appearing poorly. She does have a new ulcer on her left heel unfortunately. This may be pressure related. Nonetheless it seems to me to have potentially started at least as a blister I do not see any evidence of deep tissue injury. In regard to the left ankle the snap vac still seems to be causing the ceiling off of the deeper part of the wound which is in turn trapping fluid. I'm not extremely pleased with the overall appearance as far as progress from last week to this week therefore I'm gonna discontinue  the snap vac at this point. 10/18/18 patient unfortunately this point has not been feeling well for the past several days. She was seen by Grayland Ormond her primary care provider who is a Librarian, academic at [facility]. Subsequently she states that she's been very weak and generally feeling malaise. No fevers, chills, nausea, or vomiting noted at this time. With that being said bloodwork was performed at the PCP office on the 11th of this month which showed a white blood cell count of 10.7. This was repeated today and shows a white blood cell count of 12.4. This does show signs of worsening. Coupled with the fact that she is feeling worse and that her left ankle wound is not really showing signs of improvement I feel like this is an indication that the osteomyelitis is likely exacerbating not improving. Overall I think we may also want to check her C-reactive protein and sedimentation rate. Actually did call [facility] office this afternoon while the patient was in the office here with me. Subsequently based on the findings we discussed treatment possibilities and I think that it is appropriate for [place] to go ahead and initiate treatment with doxycycline which I'm going to do. Subsequently he did agree to see about adding a CRP and sedimentation rate to her orders. If that has not already been drawn to where they can run it they will contact the patient she can come back to have that check. They are in agreement with plan as far as the patient and her daughter are concerned. Nonetheless also think we need to get in touch with Dr. Henreitta Leber office to see about getting the patient scheduled with him as soon as possible. 11/08/18 on evaluation today patient presents for follow-up concerning her bilateral foot and ankle ulcers. I did do an extensive review of her chart in epic today. Subsequently she was seen by [name] he did initiate Cefepime IV antibiotic therapy. Subsequently she had some  issues with her PICC line this had to be removed because it was coiled and then replaced. Fortunately that was now settled. Unfortunately she has continued have issues with her left heel as well as the issues that she is experiencing with her bilateral lateral malleolus regions. I do believe however both areas seem to be doing a little bit better on evaluation today which is good news. No fevers, chills, nausea, or vomiting noted at this time. She actually has an angiogram schedule with Dr. dew on this coming Monday, November 11, 2018. Subsequently the patient states that she is feeling much better especially than what she was roughly 2 weeks ago. She actually had to cancel  an appointment because she was feeling so poorly. No fevers, chills, nausea, or vomiting noted at this time. 11/15/18 on evaluation today patient actually is status post having had her angiogram with Dr. dew Monday, four days ago. 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 He had to go and work on several areas of the vasculature fortunately he was able to obtain no more than a 30% residual stenosis throughout post procedure. I reviewed this note today. I think this will definitely help with healing at this time. Fortunately there does not appear to be any signs of infection and I do feel like ratio already has a better appearance to it. 11/22/18 upon evaluation today patient actually appears to be doing very well in regard to her wounds in general. The right lateral malleolus looks excellent the heel looks better in the left lateral malleolus also appears to be doing a little better. With that being said the right second toe actually appears to be open and training we been watching this is been dry and stable but now is open. 12/03/2018 Seen today for follow-up and management of multiple bilateral lower extremity wounds. New pressure injury of the great toe which is closed at this time. Wound of the right  distal second toe appears larger today with deep undermining and a pocket of fluid present within the undermining region. Left and right malleolus is wounds are stable today with no signs and symptoms of infection.Denies any needs or concerns during exam today. 12/13/18 on evaluation today patient appears to be doing somewhat better in regard to her left heel ulcer. She also seems to be completely healed in regard to the right lateral malleolus ulcer. The left malleolus ulcer is smaller what unfortunately the wounds which are new over the first and second toes of the right foot are what are most concerning at this point especially the second. Both areas did require sharp debridement today. 12/20/18 on evaluation today patient's wound actually appears to be doing better in regard to left lateral ankle and her right lateral ankle continues to remain healed. The hill ulcer on the left is improved. She does have improvement noted as well in regard to both toe ulcers. Overall I'm very pleased in this regard. No fevers, chills, nausea, or vomiting noted at this time. 12/23/18 on evaluation today patient is seen after she had her toenails trimmed at the podiatrist office due to issues with her right great toe. There was what appeared to be dark eschar on the surface of the wound which had her in the podiatrist concerned. Nonetheless as I remember that during the last office visit I had utilize silver nitrate of this area I was much less concerned about the situation. Subsequently I was able to clean off much of this tissue without any complication today. This does not appear to show any signs of infection and actually look somewhat better [name], [name] ([id number]) compared to last time post debridement. Her second toe on the right foot actually had callous over and there did appear still be some fluid underneath this that would require debridement today. 12/27/18 on evaluation today patient actually  appears to be showing signs of improvement at all locations. Even the left lateral ankle although this is not quite as great as the other sites. Fortunately there does not appear to be any signs of infection at this time and both of her toes on the right foot seem to be showing  signs of improvement which is good news and very pleased in this regard. 01/03/19 on evaluation today patient appears to be doing better for the most part in regard to her wounds in particular. There does not appear to be any evidence of infection at this time which is good news. Fortunately there is no sign of really worsening anywhere except for the right great toe which she does have what appears to be a bruise/deep tissue injury which is very superficial and already resolving. I'm not sure where this came from I questioned her extensively and she does not recall what may have happened with this. Other than that the patient seems to be doing well even the left lateral ankle ulcer looks good and is getting smaller. 01/10/19 on evaluation today patient appears to be doing well in regard to her left heel wound and both of her toe wounds. Overall I feel like there is definitely improvement here and I'm happy in that regard. With that being said unfortunately she is having issues with the left lateral malleolus ulcer which unfortunately still has a lot of depth to it. This is gonna be a very difficult wound for [place] to be able to truly get to heal. I may want to consider some type of skin substitute to see if this would be of benefit for her. I'll discuss this with her more the next visit most likely. This was something I thought about more at the end of the visit when I was Artie out of the room and the patient had been discharged. 01/17/19 on evaluation today patient appears to be doing very well in regard to her wounds in general. She's been making excellent progress at this time. Fortunately there's no sign of infection at this time  either. No fevers, chills, nausea, or vomiting noted at this time. The biggest issue is still her left lateral malleolus where it appears to be doing well and is getting smaller but still shows a small corner where this is deeper and goes down into what appears to be the joint space. Nonetheless this is taking much longer to heal although it still looks better in smaller than previous evaluations. 01/24/19 on evaluation today patient's wounds actually appear to be doing rather well in general overall. She did require some sharp debridement in regard to the right great toe but everything else appears to be doing excellent no debridement was even necessary. No fevers, chills, nausea, or vomiting noted at this time. 01/31/19 on evaluation today patient actually appears to be doing much better in regard to her left foot wound on the heel as well as the ankle. The right great toe appears to be a little bit worse today this had callous over and trapped a lot of fluid underneath. Fortunately there's no signs of infection at any site which is great news. 02/07/19 on evaluation today patient actually appears to be doing decently well in regard to all of her ulcers at this point. No sharp debridement was required she is a little bit of hyper granulation in regard to the left lateral ankle as well as the left heel but the hill itself is almost completely healed which is excellent news. Overall been very pleased in this regard. 02/14/19 on evaluation today patient actually appears to be doing very well in regard to her ulcers on the right first toe, left lateral malleolus, and left heel. In fact the heel is almost completely healed at this point. The patient does not show  any signs of infection which is good news. Overall very pleased with how things have progressed. 04/18/19 Telehealth Evaluation During the COVID-19 National Emergency: Verbal Consent: Obtained from patient Allergies: reviewed and the active list is  current. Medication changes: patient has no current medication changes. COVID-19 Screening: 1. Have you traveled internationally or on a cruise ship in the last 14 dayso No 2. Have you had contact with someone with or under investigation for COVID-19o No 3. Have you had a fever, cough, sore throat, or experiencing shortness of breatho No on evaluation today actually did have a visit with this patient through a telehealth encounter with her home health nurse. Subsequently it was noted that the patient actually appears to be doing okay in regard to her wounds both the right great toe as well as the left lateral malleolus have shown signs of improvement although this in your theme around the left lateral malleolus there eschar coverings for both locations. The question is whether or not they are actually close and whether or not home health needs to discharge the patient or not. Nonetheless my concern is this point obviously is that without actually seeing her and being able to evaluate this directly I cannot ensure that she is completely healed which is the question that I'm being asked. 04/22/19 on evaluation today patient presents for her first evaluation since last time I saw her which was actually February 14, 2019. I did do a telehealth visit last week in which point it was questionable whether or not she may be healed and had to bring her in today for confirmation. With that being said she does seem to be doing quite well at this point which is good news. There does not appear to be any drainage in the deed I believe her wounds may be healed. Readmission: 09/04/2019 on evaluation today patient appears to be doing unfortunately somewhat more poorly in regard to her left foot ulcer secondary to a wound that began on 08/21/2019 at least when she first noticed this. Fortunately she has not had any evidence of active infection at this time. Systemically. I also do not necessarily see any evidence of  infection at the blister/wound site on the first metatarsal head plantar aspect. This almost appears to be something that may have just rubbed inappropriately causing this to breakdown. They did not want a wait too long to come in to be seen as again she had significant issues in the past with wounds that took quite a while to heal in fact it was close to 2 years. Nonetheless this does not appear to be quite that bad but again we do need to remove some of the necrotic tissue from the surface of the wound to tell exactly the extent. She does not appear to have any significant arterial disease at this point and again her last ABIs and TBI's are recorded above in the alert section her left ABI was 1.27 with a TBI of 0.72 to the right ABI 1.08 with a TBI of 0.39. Other than this the patient has been doing quite well since I last saw her and that was in May 2020. 09/11/2019 on evaluation today patient appeared to be doing very well with regard to her plantar foot ulcer on the left. In fact this appears to be almost completely healed which is awesome. That is after just 1 week of intervention. With that being said there is no signs of active infection at this time. LINDEY, [name] ([id number])  09/18/2019 on evaluation today patient actually appears to be doing excellent in fact she is completely healed based on what I am seeing at this point. Fortunately there is no signs of active infection at this time and overall patient is very pleased to hear that this area has healed so quickly. Readmission: 05/13/2021 upon evaluation today this patient presents for reevaluation here in the clinic. This is a wound that actually we previously took care of. She had 1 on the right ankle and the left the left turned out to be be harder due to to heal but nonetheless is doing great at this point as the right that has reopened and it was noted first just several weeks ago with a scab over it and came off in just the past  few days. Fortunately there does not appear to be any obvious evidence of significant active infection at this time which is great news. No fevers, chills, nausea, vomiting, or diarrhea. 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 There does not appear to be any signs of this interfering in any way with her wound. She does have swelling we previously had compression socks for her ordered but again it does not look like she wears these on a regular basis by any means. 05/26/2021 upon evaluation today patient appears to be doing well with regard to her wound which is actually showing signs of excellent improvement. There does not appear to be any signs of active infection which is great news and overall very pleased with where things stand today. No fevers, chills, nausea, vomiting, or diarrhea. 06/02/2021 upon evaluation today patient's wound actually showing signs of excellent improvement. Fortunately there does not appear to be any signs of active infection which is great news. I think the patient is making good progress with regard to her wounds in general. 06/09/2021 upon evaluation today patient appears to be doing excellent in regard to her wounds currently. Fortunately there does not appear to be any signs of active infection which is great news. No fevers, chills, nausea, vomiting, or diarrhea. Overall extremely pleased with where things stand today. I think the patient is making excellent progress. 06/16/2021 upon evaluation today patient appears to be doing well in regard to her wound. This is going require little bit of debridement today and that was discussed with the patient. Otherwise she seems to be doing quite well and I am actually very pleased with where things stand at this point. No fevers, chills, nausea, vomiting, or diarrhea. 06/23/2021 upon evaluation today patient appears to be doing well with regard to her wounds. She has been  tolerating the dressing changes without complication. Fortunately there does not appear to be any evidence of infection and she has not had air in her home which she actually lives at an assisted living that got fixed this morning. With that being said because of that her wrap has been extremely hot and bothersome for her over the past week. 06/30/2021 upon evaluation today patient is actually making excellent progress in regard to her ankle ulcer. She has been tolerating the dressing changes without complication and overall extremely pleased with where things stand there does not appear to be any evidence of active infection which is great news. No fevers, chills, nausea, vomiting, or diarrhea. 07/07/21 upon evaluation today patients and culture on the right actually appears to be doing quite well. There does not appear to be any signs of infection and  overall very pleased with where things stand today. No fevers, chills, nausea, or vomiting noted at this time. 07/14/2021 unfortunately the patient today has some evidence of deep tissue injury and pressure getting to the ankle region. Again I am not exactly sure what is going on here but this is very similar to issues that we have had in the past. I explained to the patient that she needs to be very mindful of exactly what is happening I think sleeping in bed is probably the main issue here although there could be other culprits I am not sure what else would potentially lead to this kind of a problem for her. 07/21/2021 upon evaluation today patient's wound actually showing signs of improvement compared to last week. Fortunately there does not appear to be any signs of active infection which is great news and overall very pleased with where things stand in that regard. With that being said I do believe that she is continuing to show signs of overall of getting better although I think this is still basically about what we were 2 weeks ago due to the  worsening and now improvement. 07/28/2021 upon evaluation today patient appears to be doing well with regard to her wound. She does have some slough buildup on the surface of the wound which I would have to manage today. Fortunately there is no sign of active infection at this time. No fevers, chills, nausea, vomiting, or diarrhea. 08/04/2021 upon evaluation today patient appears to be doing about the same in regard to her wound. To be perfectly honest I am beginning to be a little bit concerned about the overall appearance of the wound bed. I do think possibly taking a sample right around the margin of the wound could be beneficial for her as far as identifying anything such as an inflammatory process or to be honest even a skin tag cancer type process that may be of concern here. Fortunately there does not appear to be any evidence of active infection at this time which is great news she is not having any pain also great news. 08/11/2021 upon evaluation today patient appears to be doing well with regard to her wound. The good news is I did review her biopsy results and it showed some inflammatory mixed findings but nothing that appeared to be malignant which is great news. Overall this is more of a chronic venous stasis type issue which again is more what we have been treating. Nonetheless I just wanted to make sure before going forward that there was not anything more untoward going on at this point. 08/18/2021 upon evaluation today patient appears to be doing well with regard to her ankle ulcer. Fortunately there does not appear to be any signs of active infection at this time which is great overall wound is dramatically improved compared to last week. Since last week I have actually placed her on doxycycline and subsequently this is a good option as far as the findings are concerned at this point. 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 the doxycycline is doing that. There does not appear to be any evidence of active infection systemically which is great news. 08/25/2021 upon evaluation today patient appears to be doing well with regard to her wound. I feel like we are finally get back on track as far as healing is concerned  I am much happier with the overall appearance today. I do think that she is tolerating the dressing changes without complication which is great news. We have been using Hydrofera Blue which I think is a good option. The good news is she is also doing great in regard to her compression sock on the left which is a zipper compression that seems to be doing a great job keeping her edema under good control. 09/01/2021 upon evaluation today patient appears to be doing well with regard to her wound. Infection seems to be under much better control which is great news and very pleased in that regard. Fortunately there does not appear to be any signs of infection currently. [name], [name] ([id number]) 09/08/2021 upon evaluation today patient actually appears to be making good progress in regard to her wound. She has been tolerating the dressing changes without complication. Fortunately there does not appear to be any evidence of active infection at this time which is great news as well. No fevers, chills, nausea, vomiting, or diarrhea. 09/22/2021 upon evaluation today patient appears to be doing well with regard to her wound although is very slow to heal. We have not looked into Apligraf yet I think that is something that we should see about doing. 09/29/2021 upon evaluation today patient's wound is actually showing signs of doing about the same. I am not seeing any evidence of worsening but also no significant evidence of improvement. We did gain approval for the organogenesis products all except for Apligraf as covered by her insurance. With that being  said I do think that we can go ahead and proceed with the NuShield if the patient and her family in agreement of the plan I discussed that with him today she does have a 20% coinsurance which we also discussed. 10/06/2021 upon evaluation today patient appears to unfortunately be doing a little bit worse she appears to be infected based on what I am seeing. Fortunately there does not appear to be any signs of active infection at this time which is great news. Unfortunately it does appear to be some evidence of around the wound edge indicated by way of erythema and warmth as well as redness 10/13/2021 upon evaluation today patient actually appears to be doing excellent in regard to her ankle ulcer compared to what it was. 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 I think were fine to go ahead and apply the NuShield today. 10/20/2021 upon evaluation today patient appears to be doing excellent in fact the NuShield seems to have done all some for her thus far. I am actually very pleased with where we stand and overall I think that she is making great progress. There is no evidence of active infection at this time. 11/23; patient presents for follow-up. She has no issues or complaints today. She denies signs of infection. She reports stability in her wound healing. 11/03/2021 upon evaluation today patient appears to be doing well with regard to her wounds. She has been tolerating the dressing changes without complication. Fortunately there is no signs of active infection at this time. 11/10/2021 upon evaluation today patient appears to be doing well with regard to her right ankle which is actually showing signs of improvement with the NuShield I am very pleased. Subsequently in regards to the  left ankle this  appears to be doing okay with no evidence of issue here either. With that being said she has a significant contusion on the left leg from having fallen 2 days ago. She tells me that she was using her walker going to the closet and then when she got to the closet turned around to get something out at which point she fell according to the story. Nonetheless she does have a hematoma just below her knee on the anterior portion of her shin. My hope is that this will not open until wound although we do need I think Some compression over it also think that we need to have her use an ice as well to help with the swelling and prevent this from getting worse. The last thing she needs is a wound opened up here. 11/17/2021 upon evaluation patient's right ankle actually showing signs of improvement based on what I see currently there is a lot of new skin growth coming in which is great news. Nonetheless I do feel like that the patient is showing improvement as well in regard to her left leg with the use of the Tubigrip it swollen but not as bruised as what I would have expected after what I was seeing last week. Nonetheless I do think doubling up on the Tubigrip would probably be beneficial to try to keep some of the edema under better control here. 11/24/2021 upon evaluation today patient appears to be doing excellent in regard to her wound. This is actually measuring significantly smaller which is great news. Fortunately I do not see any signs of active infection locally nor systemically at this point. Overall I am very pleased with how the NuShield is doing. 12/30; wound bed looks healthy however not much change in overall wound volume. We applied Nushield again today in the standard fashion 12/08/2021 upon evaluation today patient's wound actually showing signs of good improvement and I am actually very pleased with where we stand today as well. I do not see any evidence of active infection locally nor  systemically which is great news. Unfortunately she has been having some issues with stomach upset and diarrhea today. 12/15/2021 upon evaluation today patient appears to be doing excellent in regard to her wound. There is a little bit of dry skin raised up around the edges of the wound but fortunately nothing too significant at this point. Fortunately I do not see any evidence of active infection either which is great news. No fevers, chills, nausea, vomiting, or diarrhea. 12/29/2021 upon evaluation today patient appears to be doing well with regard to her wound. In general I feel like she is getting very close to complete resolution. I think that she is making good progress here as well. Overall I do not see any signs of infection and very little of this appears to actually be open. 01/12/2022 upon evaluation today patient appears to be doing a little bit worse in regard to her wound. It appears that the collagen actually trapped fluid underneath the collagen which got hard and subsequently caused the fluid to back up. This patient has made the wound appear to be larger than what it was previous. With that being said I do not see any signs of obvious infection is not warm to touch and not significantly erythematous either which is good news. Nonetheless I do think that we will need to continue to keep an eye on this. Probably not go put on antibiotic this week but I will  be too far from doing so she is actually on a Z-Pak right now I do not want to really double up on antibiotics. 01/26/2022 upon evaluation today patient's wound is showing signs of improvement although this is slow to turn back around. Fortunately there does not appear to be any evidence of active infection locally or systemically which is great news and overall I am extremely pleased with where we stand today. No fevers, chills, nausea, vomiting, or diarrhea. 02/02/2022 upon evaluation today patient appears to be doing well with regard  to her wound. She has been tolerating the dressing changes without complication. Fortunately there does not appear to be any signs of active infection locally nor systemically at this time which is great news. No fevers, chills, nausea, vomiting, or diarrhea. 3/9; patient presents for follow-up. She has no issues or complaints today. She denies signs of infection. 02/16/2022 upon evaluation today patient appears to be doing well with regard to her wound. She has been tolerating the dressing changes without complication. Fortunately I do not see any evidence of active infection locally nor systemically at this point which is great news. Overall I [name], [name] ([id number]) think that the patient is making excellent progress in general. The wound is measuring smaller and though there does appear to be some need for sharp debridement today this is minimal compared to what we have noted previous. 02/23/2022 upon evaluation today patient appears to be doing well with regard to her wound she is definitely showing signs of improvement which is great news. 03/02/2022 upon evaluation today patient appears to be doing about the same in regard to her wound. Unfortunately were not seeing significant improvement. With that being said is also not significantly worse which is great news. No fevers, chills, nausea, vomiting, or diarrhea. 03-09-2022 upon evaluation today patient appears to be doing well with regard to her wound. She has been tolerating the dressing changes without complication. Fortunately there does not appear to be any signs of active infection locally or systemically which is great news. 03-16-2022 upon evaluation today patient appears to be doing well with regard to her wound this is measuring smaller and looking much better. I am actually very pleased with where we stand and I think that the patient is making great progress. I do not see any evidence of active infection locally or systemically  which is great news. No fevers, chills, nausea, vomiting, or diarrhea. 03-23-2022 upon evaluation today patient appears to be doing better in regard to her wounds. In fact the wound area actually is showing signs of excellent improvement and actually very pleased with where we stand today. I do not see any evidence of active infection locally nor systemically which is great news. 4/27; comes in today with again thick callus around the wound circumference which I removed with a curette. Some debris on the surface. Overall I do not think quite as good as it was last week by description. Using endoform 04-06-2022 upon evaluation today patient appears to be doing pretty well in regard to her wound. Fortunately I do not see any evidence of active infection locally or systemically which is great news and overall I am pleased in that regard. I do feel like this is measuring smaller postdebridement compared to where we were previous. Overall she is headed in the right direction. She does have an area that is threatening to open and looks a little irritated on the left ankle we did measure this just for the discolored  area for now its not draining too much but we want to monitor and make sure nothing worsens here. 04-13-2022 upon evaluation today patient appears to be doing better in regard to her wound although this is still very slow to heal. We have reapplied for a skin substitute but have not heard anything back as of yet. Fortunately I do not see any evidence of active infection locally or systemically at this time which is great news. 04-20-2022 upon evaluation today patient's wound is actually showing signs of significant improvement which is great news. We did get approval for skin substitutes which I think is an option here for [place] but at the same time I am not even certain that this is going to be necessary especially considering how well things seem to be doing at this point. If she continues healing as  we see her right now but I think were probably going to be able to avoid any need for additional skin substitute. Patient and her daughter are both in agreement with that plan. With that being said she did have a fall she has several areas of contusion fortunately nothing that is can require intervention at this point but nonetheless she did also fracture her rib which is extremely uncomfortable obviously. 04-27-2022 upon evaluation today patient appears to be doing well currently in regard to her wound on the right lateral ankle. Overall I feel like she is actually doing significantly better even compared to last week and very pleased in this regard. I think we are on the right track here. 05-04-2022 upon evaluation today patient appears to be doing well with regard to her wound. She is going require some sharp debridement today to clear away some of the necrotic debris. Fortunately I was able to do this quite easily without significant issue or breakdown here. There is a very small area of opening centrally but really this is pretty close to getting close. 05-11-2022 upon evaluation today patient's wound is actually showing signs of excellent improvement. I do not see any evidence of infection locally or systemically at this time. Overall I think she is very close to complete resolution with just a little bit of debridement to clear away some of the dry crusty area around the edges of the wound currently. 05-18-2022 upon evaluation patient's wound bed actually appears to be likely healed although I cannot be 100% sure I am extremely pleased however with where things stand today. I do not see any evidence of infection locally or systemically which is great news and overall I think you are on the right track here. 05-25-2022 upon evaluation today patient appears to be doing well with regard to her leg ulcer. Fortunately there does not appear to be any signs of active infection locally or systemically at  this time which is great news. No fevers, chills, nausea, vomiting, or diarrhea. With that being said she does appear to be healed in regard to her right lateral ankle which is great news. In regard to her leg unfortunately during the removal of the compression wrap there was a small skin tear that occurred with the scissors. Subsequently the patient does have a minimal amount of bleeding this is going require a couple Steri-Strips in order to seal this up. I do think it should likely heal quite well. Obviously this was unintentional. Nonetheless we do have to definitely monitor make sure that this completely heals up as well I am hopeful that we will be the case by next  week to be honest. 06-01-2022 upon evaluation today patient appears to be doing excellent in regard to her wounds. Both areas are completely healed which is great news. Fortunately I do not see any signs of active infection locally or systemically at this time which is great and overall I am extremely pleased in that regard. No fevers, chills, nausea, vomiting, or diarrhea. 7/25; patient returns to clinic today with a story that 2 to 3 days ago she was sitting in her chair and she noticed bleeding from her left lower leg lateral aspect. She denies any trauma. She used pressure and a Band-Aid to get it to stop and she arrives in clinic today in follow-up. She was discharged on 6/29 apparently to stockings although she did not have any on and I do not think she was actually wearing them. She has significant chronic venous insufficiency 07-04-2022 upon evaluation today patient presents for follow-up. In the clinic she actually was seen last week when I was on vacation due to an area on her leg which reopened unfortunately. With that being said she does seem to be doing a little bit better but unfortunately this is still giving her some trouble here. All of her other wounds are still closed which I was previously seen her for and that is  great news. 07-13-2022 upon evaluation today patient appears to be doing well with regard to her wound everything appears to be healed unfortunately she has a completely new area on her left heel which is a blister. Fortunately I do not see any signs of infection but unfortunately I do think this is going to be ongoing issue at this time. Question has been made whether or not we can get this to reabsorb or not. Again right now I am going to OLIVIANA, MCGAHEE. ([id number]) treat this more as a deep tissue injury currently. 07-20-2022 upon evaluation today patient's wound is actually showing signs of being somewhat worse I definitely think she is infected at this point. We will get a need to subsequently have her started on antibiotic as soon as possible. She voiced understanding and her daughter is in agreement with this plan. We were hoping that we would get the blister reabsorb that just did not happen. 07-27-2022 upon evaluation today patient's ankle ulcer unfortunately is not doing nearly as well as well would like to see. Fortunately there does not appear to be any signs of infection locally or systemically at this time which is great news and overall I am extremely pleased. With that being said unfortunately the patient continues to have issues with her left lateral ankle which is now open and subsequently the heel which again is of concern here. 08-03-2022 upon evaluation today patient appears to be doing better in regard to the heel though she is have some issues in regard to this left lateral ankle region. Unfortunately I think that this is just going to take some time I think we probably need to add Santyl to the regimen. 08-10-2022 upon evaluation today patient unfortunately appears to be doing much worse in regard to her ankle. The heel is looking okay and I think were doing decently well in that regard. The ankle on the other hand is completely different story. I am definitely not nearly as  pleased with this. Electronic Signature(s) Signed: 08/10/2022 2:07:16 PM By: Worthy Keeler PA-C Entered By: Worthy Keeler on 08/10/2022 14:07:16 Zynda, [name] ([id number]) -------------------------------------------------------------------------------- Physical Exam Details Patient Name: [name] Date  of Service: 08/10/2022 9:45 AM Medical Record Number: [id number] Patient Account Number: [id number] Date of Birth/Sex: [birth year] (88 y.o. F) Treating RN: Cornell Barman Primary Care Provider: Adrian Prows Other Clinician: Massie Kluver Referring Provider: Adrian Prows Treating Provider/Extender: Skipper Cliche in Treatment: 20 Constitutional Well-nourished and well-hydrated in no acute distress. Respiratory normal breathing without difficulty. Psychiatric this patient is able to make decisions and demonstrates good insight into disease process. Alert and Oriented x 3. pleasant and cooperative. Notes Again patient's heel ulcer is actually showing signs of significant improvement which is great news and very pleased in that regard. With regard to the ankle this is not doing nearly as well. I really think we probably need to see about getting an x-ray of the ankle in order to further evaluate the situation. Patient voiced understanding and her daughter [name] was present during the evaluation as well. Electronic Signature(s) Signed: 08/10/2022 2:07:55 PM By: Worthy Keeler PA-C Entered By: Worthy Keeler on 08/10/2022 14:07:55 Cozad, [name] ([id number]) -------------------------------------------------------------------------------- Physician Orders Details Patient Name: [name] Date of Service: 08/10/2022 9:45 AM Medical Record Number: [id number] Patient Account Number: [id number] Date of Birth/Sex: [birth year] (88 y.o. F) Treating RN: Cornell Barman Primary Care Provider: Adrian Prows Other Clinician: Massie Kluver Referring Provider:  Adrian Prows Treating Provider/Extender: Skipper Cliche in Treatment: 74 Verbal / Phone Orders: No Diagnosis Coding ICD-10 Coding Code Description [id number] Non-pressure chronic ulcer of other part of left lower leg with other specified severity S80.12XD Contusion of left lower leg, subsequent encounter L89.623 Pressure ulcer of left heel, stage 3 I89.0 Lymphedema, not elsewhere classified I87.2 Venous insufficiency (chronic) (peripheral) Z95.0 Presence of cardiac pacemaker Z79.01 Long term (current) use of anticoagulants Follow-up Appointments o Return Appointment in 1 week. Home Health Wound #11 [place] o ADMIT to Home Health for wound care. May utilize formulary equivalent dressing for wound treatment orders unless otherwise specified. Home Health Nurse may visit [facility] to address patientos wound care needs. o [place] for wound care. May utilize formulary equivalent dressing for wound treatment orders unless otherwise specified. Home Health Nurse may visit [facility] to address patientos wound care needs. o **Please direct any NON-WOUND related issues/requests for orders to patient's Primary Care Physician. **If current dressing causes regression in wound condition, may D/C ordered dressing product/s and apply Normal Saline Moist Dressing daily until next [place] or Other MD appointment. **Notify Wound Healing Center of regression in wound condition at [phone]. Wound #12 [place] o ADMIT to [facility] for wound care. May utilize formulary equivalent dressing for wound treatment orders unless otherwise specified. Home Health Nurse may visit [facility] to address patientos wound care needs. o [place] for wound care. May utilize formulary equivalent dressing for wound treatment orders unless otherwise specified. Home Health Nurse may visit [facility] to  address patientos wound care needs. o **Please direct any NON-WOUND related issues/requests for orders to patient's Primary Care Physician. **If current dressing causes regression in wound condition, may D/C ordered dressing product/s and apply Normal Saline Moist Dressing daily until next [place] or Other MD appointment. **Notify Wound Healing Center of regression in wound condition at [phone]. Bathing/ Shower/ Hygiene o May shower with wound dressing protected with water repellent cover or cast protector. o No tub bath. Anesthetic (Use 'Patient Medications' Section for Anesthetic Order Entry) o Lidocaine applied to wound bed Edema Control - Lymphedema /  Segmental Compressive Device / Other o Tubigrip single layer applied. - Tubi D single layer left lower leg o Elevate, Exercise Daily and Avoid Standing for Long Periods of Time. o Elevate legs to the level of the heart and pump ankles as often as possible o Elevate leg(s) parallel to the floor when sitting. Non-Wound Condition Left Lower Extremity o Cleanse affected area with antibacterial soap and water, Medications-Please add to medication list. o P.O. Antibiotics - continue Doxycycline Larita, Deremer [name] ([id number]) Wound Treatment Wound #11 - Calcaneus Wound Laterality: Left Cleanser: Byram Ancillary Kit - 15 Day Supply (Generic) 1 x Per Day/30 Days Discharge Instructions: Use supplies as instructed; Kit contains: (15) Saline Bullets; (15) 3x3 Gauze; 15 pr Gloves Topical: Santyl Collagenase Ointment, 30 (gm), tube 1 x Per Day/30 Days Discharge Instructions: apply nickel thick to wound bed only Primary Dressing: Xeroform 4x4-HBD (in/in) (Generic) 1 x Per Day/30 Days Discharge Instructions: Apply Xeroform 4x4-HBD (in/in) as directed Secondary Dressing: ABD Pad 5x9 (in/in) (Generic) 1 x Per Day/30 Days Discharge Instructions: Cover with ABD pad Secondary Dressing: Gauze 1 x Per Day/30  Days Discharge Instructions: As directed: dry, moistened with saline or moistened with Dakins Solution Secured With: Conform 4'' - Conforming Stretch Gauze Bandage 4x75 (in/in) 1 x Per Day/30 Days Discharge Instructions: Apply as directed Secured With: Tubigrip Size D, 3x10 (in/yd) 1 x Per Day/30 Days Discharge Instructions: single layer Wound #12 - Ankle Wound Laterality: Left, Lateral Cleanser: Byram Ancillary Kit - 15 Day Supply (Generic) 1 x Per Day/30 Days Discharge Instructions: Use supplies as instructed; Kit contains: (15) Saline Bullets; (15) 3x3 Gauze; 15 pr Gloves Topical: Santyl Collagenase Ointment, 30 (gm), tube 1 x Per Day/30 Days Discharge Instructions: apply nickel thick to wound bed only Primary Dressing: Xeroform 4x4-HBD (in/in) (DME) (Dispense As Written) 1 x Per Day/30 Days Discharge Instructions: Apply Xeroform 4x4-HBD (in/in) as directed Secondary Dressing: ABD Pad 5x9 (in/in) (DME) (Generic) 1 x Per Day/30 Days Discharge Instructions: Cover with ABD pad Secondary Dressing: Gauze 1 x Per Day/30 Days Discharge Instructions: As directed: dry, moistened with saline or moistened with Dakins Solution Secured With: Medipore Tape - 73M Medipore H Soft Cloth Surgical Tape, 2x2 (in/yd) (Generic) 1 x Per Day/30 Days Secured With: Conform 4'' - Conforming Stretch Gauze Bandage 4x75 (in/in) (Generic) 1 x Per Day/30 Days Discharge Instructions: Apply as directed Secured With: Tubigrip Size D, 3x10 (in/yd) 1 x Per Day/30 Days Discharge Instructions: single layer Radiology o X-ray, ankle - left ankle Patient Medications Allergies: Sulfa (Sulfonamide Antibiotics), metronidazole, monistat Notifications Medication Indication Start End linezolid 08/10/2022 DOSE 1 - oral 600 mg tablet - 1 tablet oral taken 2 times per day for 30 days Electronic Signature(s) Signed: 08/10/2022 11:42:18 AM By: Worthy Keeler PA-C Entered By: Worthy Keeler on 08/10/2022 11:42:17 Mcmurtry, [name]  ([id number]) Harnden, [name] ([id number]) -------------------------------------------------------------------------------- Problem List Details Patient Name: [name] Date of Service: 08/10/2022 9:45 AM Medical Record Number: [id number] Patient Account Number: [id number] Date of Birth/Sex: 1923/10/31 (88 y.o. F) Treating RN: Cornell Barman Primary Care Provider: Adrian Prows Other Clinician: Massie Kluver Referring Provider: Adrian Prows Treating Provider/Extender: Skipper Cliche in Treatment: 74 Active Problems ICD-10 Encounter Code Description Active Date MDM Diagnosis L97.828 Non-pressure chronic ulcer of other part of left lower leg with other 06/27/2022 No Yes specified severity S80.12XD Contusion of left lower leg, subsequent encounter 06/27/2022 No Yes L89.623 Pressure ulcer of left heel, stage 3 07/13/2022 No Yes I89.0 Lymphedema, not elsewhere classified  05/13/2021 No Yes I87.2 Venous insufficiency (chronic) (peripheral) 05/13/2021 No Yes Z95.0 Presence of cardiac pacemaker 05/13/2021 No Yes Z79.01 Long term (current) use of anticoagulants 05/13/2021 No Yes Inactive Problems ICD-10 Code Description Active Date Inactive Date 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, stage 3 05/13/2021 05/13/2021 S81.811A Laceration without foreign body, right lower leg, initial encounter 05/25/2022 05/25/2022 S80.12XA Contusion of left lower leg, initial encounter 11/10/2021 11/10/2021 Resolved Problems Electronic Signature(s) Signed: 08/10/2022 10:08:49 AM By: Worthy Keeler PA-C Entered By: Worthy Keeler on 08/10/2022 10:08:49 [name], [name] ([id number]) [name], [name] ([id number]) -------------------------------------------------------------------------------- Progress Note Details Patient Name: [name] Date of Service: 08/10/2022 9:45 AM Medical Record Number: [id number] Patient Account Number: [id number] Date of Birth/Sex: Feb 25, 1923 (88 y.o. 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 Dr. Amalia Hailey the podiatrist saw her on 07/20/2017 and sent her to the wound center for possible hyperbaric oxygen therapy. past medical history of peripheral vascular disease, varicose veins, status post appendectomy, basal cell carcinoma excision from the left leg, cholecystectomy, pacemaker placement, right lower extremity angiography done by Dr. dew in March 2017 with placement of a stent. there is also note of a successful ablation of the right small saphenous vein done which was reviewed by ultrasound on 10/24/2016. the patient had a right small saphenous vein ablation done on 10/20/2016. The patient has never been a smoker. She has been seen by Dr. Corene Cornea dew the vascular surgeon who most recently saw her on 06/15/2017 for evaluation of ongoing problems with right leg swelling. She had a lower extremity arterial duplex examination done(02/13/17) which showed patent distal right superficial femoral artery stent and above-the-knee popliteal stent without evidence of restenosis. The ABI was more than 1.3 on the right and more than 1.3 on the left. This was consistent with noncompressible arteries due to medial calcification. The right great toe pressure and PPG waveforms are within normal limits and the left great toe pressure and PPG waveforms are decreased. he recommended she continue to wear her compression stockings and continue with elevation. She is scheduled to  have a noninvasive arterial study in the near future 08/16/2017 -- had a lower extremity arterial duplex examination done which showed patent distal right superficial femoral artery stent and above-the-knee popliteal stent without evidence of restenosis. The ABI was more than 1.3 on the right and more than 1.3 on the left. This was consistent with noncompressible arteries due to medial calcification. The right great toe pressure and PPG waveforms are within normal limits and the left great toe pressure and PPG waveforms are decreased. the x-ray of the right ankle has not yet been done 08/24/2017 -- had a right ankle x-ray -- IMPRESSION:1. No fracture, bone lesion or evidence of osteomyelitis. 2. Lateral soft tissue swelling with a soft tissue ulcer. she has not yet seen the vascular surgeon for review 08/31/17 on evaluation today patient's wound appears to be showing signs of improvement. She still with her appointment with vascular in order to review her results of her vascular study and then determine if any intervention would be recommended at that time.  No fevers, chills, nausea, or vomiting noted at this time. She has been tolerating the dressing changes without complication. 09/28/17 on evaluation today patient's wound appears to show signs of good improvement in regard to the granulation tissue which is surfacing. There is still a layer of slough covering the wound and the posterior portion is still significantly deeper than the anterior nonetheless there has been some good sign of things moving towards the better. She is going to go back to Dr. dew for reevaluation to ensure her blood flow is still appropriate. That will be before her next evaluation with [place] next week. No fevers, chills, nausea, or vomiting noted at this time. Patient does have some discomfort rated to be a 3-4/10 depending on activity specifically cleansing the wound makes it worse. 10/05/2017 -- the patient was seen by Dr.  Lucky Cowboy last week and noninvasive studies showed a normal right ABI with brisk triphasic waveforms consistent with no arterial insufficiency including normal digital pressures. The duplex showed a patent distal right SFA stent and the proximal SFA was also normal. He was pleased with her test and thought she should have enough of perfusion for normal wound healing. He would see her back in 6 months time. 12/21/17 on evaluation today patient appears to be doing fairly well in regard to her right lateral ankle wound. Unfortunately the main issue that she is expansion at this point is that she is having some issues with what appears to be some cellulitis in the right anterior shin. She has also been noting a little bit of uncomfortable feeling especially last night and her ankle area. I'm afraid that she made the developing a little bit of an infection. With that being said I think it is in the early stages. 12/28/17 on evaluation today patient's ankle appears to be doing excellent. She's making good progress at this point the cellulitis seems to have improved after last week's evaluation. Overall she is having no significant discomfort which is excellent news. She does have an appointment with Dr. dew on March 29, 2018 for reevaluation in regard to the stent he placed. She seems to have excellent blood flow in the right lower extremity. 01/19/12 on evaluation today patient's wound appears to be doing very well. In fact she does not appear to require debridement at this point, there's no evidence of infection, and overall from the standpoint of the wound she seems to be doing very well. With that being said I believe that it may be time to switch to different dressing away from the [facility] Dressing she tells me she does have a lot going on her friend actually passed away yesterday and she's also having a lot of issues with her husband this obviously is weighing heavy on her as far as your thoughts  and concerns today. 01/25/18 on evaluation today patient appears to be doing fairly well in regard to her right lateral malleolus. She has been tolerating the dressing changes without complication. Overall I feel like this is definitely showing signs of improvement as far as how the overall appearance of the wound is there's also evidence of epithelium start to migrate over the granulation tissue. In general I think that she is progressing nicely as far as the wound is concerned. The only concern she really has is whether or not we can switch to every other week visits in order to avoid having as many JORITA, BOHANON ([id number]) appointments as her daughters have a difficult time getting her  to her appointments as well as the patient's husband to his he is not doing very well at this point. 02/22/18 on evaluation today patient's right lateral malleolus ulcer appears to be doing great. She has been tolerating the dressing changes without complication. Overall you making excellent progress at this time. Patient is having no significant discomfort. 03/15/18 on evaluation today patient appears to be doing much more poorly in regard to her right lateral ankle ulcer at this point. Unfortunately since have last seen her her husband has passed just a few days ago is obviously weighed heavily on her her daughter also had surgery well she is with her today as usual. There does not appear to be any evidence of infection she does seem to have significant contusion/deep tissue injury to the right lateral malleolus which was not noted previous when I saw her last. It's hard to tell of exactly when this injury occurred although during the time she was spending the night in the hospital this may have been most likely. 03/22/18 on evaluation today patient appears to actually be doing very well in regard to her ulcer. She did unfortunately have a setback which was noted last week however the good news is we seem to  be getting back on track and in fact the wound in the core did still have some necrotic tissue which will be addressed at this point today but in general I'm seeing signs that things are on the up and up. She is glad to hear this obviously she's been somewhat concerned that due to the how her wound digressed more recently. 03/29/18 on evaluation today patient appears to be doing fairly well in regard to her right lower extremity lateral malleolus ulcer. She unfortunately does have a new area of pressure injury over the inferior portion where the wound has opened up a little bit larger secondary to the pressure she seems to be getting. She does tell me sometimes when she sleeps at night that it actually hurts and does seem to be pushing on the area little bit more unfortunately. There does not appear to be any evidence of infection which is good news. She has been tolerating the dressing changes without complication. She also did have some bruising in the left second and third toes due to the fact that she may have bump this or injured it although she has neuropathy so she does not feel she did move recently that may have been where this came from. Nonetheless there does not appear to be any evidence of infection at this time. 04/12/18 on evaluation today patient's wound on the right lateral ankle actually appears to be doing a little bit better with a lot of necrotic docking tissue centrally loosening up in clearing away. However she does have the beginnings of a deep tissue injury on the left lateral malleolus likely due to the fact we've been trying offload the right as much as we have. I think she may benefit from an assistive soft device to help with offloading and it looks like they're looking at one of the doughnut conditions that wraps around the lower leg to offload which I think will definitely do a good job. With that being said I think we definitely need to address this issue on the left  before it becomes a wound. Patient is not having significant pain. 04/19/18 on evaluation today patient appears to be doing excellent in regard to the progress she's made with her right lateral ankle ulcer. The  left ankle region which did show evidence of a deep tissue injury seems to be resolving there's little fluid noted underneath and a blister there's nothing open at this point in time overall I feel like this is progressing nicely which is good news. She does not seem to be having significant discomfort at this point which is also good news. 04/25/18-She is here in follow up evaluation for bilateral lateral malleolar ulcers. The right lateral malleolus ulcer with pale subcutaneous tissue exposure, central area of ulcer with tendon/periosteum exposed. The left lateral malleolus ulcer now with central area of nonviable tissue, otherwise deep tissue injury. She is wearing compression wraps to the left lower extremity, she will place the right lower extremity compression wraps on when she gets home. She will be out of town over the weekend and return next week and follow-up appointment. She completed her doxycycline this morning 05/03/18 on evaluation today patient appears to be doing very well in regard to her right lateral ankle ulcer in general. At least she's showing some signs of improvement in this regard. Unfortunately she has some additional injury to the left lateral malleolus region which appears to be new likely even over the past several days. Again this determination is based on the overall appearance. With that being said the patient is obviously frustrated about this currently. 05/10/18-She is here in follow-up evaluation for bilateral lateral malleolar ulcers. She states she has purchased offloading shoes/boots and they will arrive tomorrow. She was asked to bring them in the office at next week's appointment so her provider is aware of product being utilized. She continues to sleep on  right or left side, she has been encouraged to sleep on her back. The right lateral malleolus ulcer is precariously close to peri-osteum; will order xray. The left lateral malleolus ulcer is improved. Will switch back to santyl; she will follow up next week. 05/17/18 on evaluation today patient actually appears to be doing very well in regard to her malleolus her ulcers compared to last time I saw them. She does not seem to have as much in the way of contusion at this point which is great news. With that being said she does continue to have discomfort and I do believe that she is still continuing to benefit from the offloading/pressure reducing boots that were recommended. I think this is the key to trying to get this to heal up completely. 05/24/18 on evaluation today patient actually appears to be doing worse at this point in time unfortunately compared to her last week's evaluation. She is having really no increased pain which is good news unfortunately she does have more maceration in your theme and noted surrounding the right lateral ankle the left lateral ankle is not really is erythematous I do not see signs of the overt cellulitis on that side. Unfortunately the wounds do not seem to have shown any signs of improvement since the last evaluation. She also has significant swelling especially on the right compared to previous some of this may be due to infection however also think that she may be served better while she has these wounds by compression wrapping versus continuing to use the Juxta-Lite for the time being. Especially with the amount of drainage that she is experiencing at this point. No fevers, chills, nausea, or vomiting noted at this time. 05/31/18 on evaluation today patient appears to actually be doing better in regard to her right lateral lower extremity ulcer specifically on the malleolus region. She has been  tolerating the antibiotic without complication. With that being said she  still continues to have issues but a little bit of redness although nothing like she what she was experiencing previous. She still continues to pressure to her ankle area she did get the problem on offloading boots unfortunately she will not wear them she states there too uncomfortable and she can't get in and out of the bed. Nonetheless at this point her wounds seem to be continually getting worse which is not what we want I'm getting somewhat concerned about her progress and how things are going to proceed if we do not intervene in some way shape or form. I therefore had a very lengthy conversation today about offloading yet again and even made a specific suggestion for switching her to a memory foam mattress and even gave the information for a specific one that they could look at getting if it was something that they were interested in considering. She does not want to be considered for a hospital bed air mattress although honestly insurance would not cover it that she does not have any wounds on her trunk. 06/14/18 on evaluation today both wounds over the bilateral lateral malleolus her ulcers appear to be doing better there's no evidence of pressure injury at this point. She did get the foam mattress for her bed and this does seem to have been extremely beneficial for her in my pinion. Her daughter states that she is having difficulty getting out of bed because of how soft it is. The patient also relates this to be. Nonetheless I do feel like she's actually doing better. Unfortunately right after and around the time she was getting the mattress she also sustained a fall when she got up to go pick up the phone and ended up injuring her right elbow she has [address] sut[address]. We are not caring for this currently although home health is going to be taking the sutures out shortly. Nonetheless this may be something that we need to evaluate going forward. It depends on TYTIANNA, GREENLEY  ([id number]) how well it has or has not healed in the end. She also recently saw an orthopedic specialist for an injection in the right shoulder just before her fall unfortunately the fall seems to have worsened her pain. 06/21/18 on evaluation today patient appears to be doing about the same in regard to her lateral malleolus ulcers. Both appear to be just a little bit deeper but again we are clinging away the necrotic and dead tissue which I think is why this is progressing towards a deeper realm as opposed to improving from my measurement standpoint in that regard. Nonetheless she has been tolerating the dressing changes she absolutely hates the memory foam mattress topper that was obtained for her nonetheless I do believe this is still doing excellent as far as taking care of excess pressure in regard to the lateral malleolus regions. She in fact has no pressure injury that I see whereas in weeks past it was week by week I was constantly seeing new pressure injuries. Overall I think it has been very beneficial for her. 07/03/18; patient arrives in my clinic today. She has deep punched out areas over her bilateral lateral malleoli. The area on the right has some more depth. We spent a lot of time today talking about pressure relief for these areas. This started when her daughter asked for a prescription for a memory foam mattress. I have never written a prescription for a  mattress and I don't think insurances would pay for that on an ordinary bed. In any case he came up that she has foam boots that she refuses to wear. I would suggest going to these before any other offloading issues when she is in bed. They say she is meticulous about offloading this the rest of the day 07/10/18- She is seen in follow-up evaluation for bilateral, lateral malleolus ulcers. There is no improvement in the ulcers. She has purchased and is sleeping on a memory foam mattress/overlay, she has been using the offloading boots  nightly over the past week. She has a follow up appointment with vascular medicine at the end of October, in my opinion this follow up should be expedited given her deterioration and suboptimal TBI results. We will order plain film xray of the left ankle as deeper structures are palpable; would consider having MRI, regardless of xray report(s). The ulcers will be treated with iodoflex/iodosorb, she is unable to safely change the dressings daily with santyl. 07/19/18 on evaluation today patient appears to be doing in general visually well in regard to her bilateral lateral malleolus ulcers. She has been tolerating the dressing changes without complication which is good news. With that being said we did have an x-ray performed on 07/12/18 which revealed a slight loosen see in the lateral portion of the distal left fibula which may represent artifact but underline lytic destruction or osteomyelitis could not be excluded. MRI was recommended. With that being said we can see about getting the patient scheduled for an MRI to further evaluate this area. In fact we have that scheduled currently for August 20 19,019. 07/26/18 on evaluation today patient's wound on the right lateral ankle actually appears to be doing fairly well at this point in my pinion. She has made some good progress currently. With that being said unfortunately in regard to the left lateral ankle ulcer this seems to be a little bit more problematic at this time. In fact as I further evaluated the situation she actually had bone exposed which is the first time that's been the case in the bone appear to be necrotic. Currently I did review patient's note from Dr. Bunnie Domino office with [facility]. He stated that ABI was 1.26 on the right and 0.95 on the left with good waveforms. Her perfusion is stable not reduced from previous studies and her digital waveforms were pretty good particularly on the right. His conclusion upon  review of the note was that there was not much she could do to improve her perfusion and he felt she was adequate for wound healing. His suggestion was that she continued to see [place] and consider a synthetic skin graft if there was no underlying infection. He plans to see her back in six months or as needed. 08/01/18 on evaluation today patient appears to be doing better in regard to her right lateral ankle ulcer. Her left lateral ankle ulcer is about the same she still has bone involvement in evidence of necrosis. There does not appear to be evidence of infection at this time On the right lateral lower extremity. I have started her on the Augmentin she picked this up and started this yesterday. This is to get her through until she sees infectious disease which is scheduled for 08/12/18. 08/06/18 on evaluation today patient appears to be doing rather well considering my discussion with patient's daughter at the end of last week. The area which was marked where she had erythema  seems to be improved and this is good news. With that being said overall the patient seems to be making good improvement when it comes to the overall appearance of the right lateral ankle ulcer although this has been slow she at least is coming around in this regard. Unfortunately in regard to the left lateral ankle ulcer this is osteomyelitis based on the pathology report as well is bone culture. Nonetheless we are still waiting CT scan. Unfortunately the MRI we originally ordered cannot be performed as the patient is a pacemaker which I had overlooked. Nonetheless we are working on the CT scan approval and scheduling as of now. She did go to the hospital over the weekend and was placed on IV Cefzo for a couple of days. Fortunately this seems to have improved the erythema quite significantly which is good news. There does not appear to be any evidence of worsening infection at this time. She did have some bleeding after the  last debridement therefore I did not perform any sharp debridement in regard to left lateral ankle at this point. Patient has been approved for a snap vac for the right lateral ankle. 08/14/18; the patient with wounds over her bilateral lateral malleoli. The area on the right actually looks quite good. Been using a snap back on this area. Healthy granulation and appears to be filling in. Unfortunately the area on the left is really problematic. 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 Also noted to have cellulitis. She saw Dr. Novella Olive of infectious disease today and was put on linezolid. We are able to verify this with her pharmacy. She is completed the Augmentin that she was already on. We've been using Iodoflex to this area 08/23/18 on evaluation today patient's wounds both actually appear to be doing better compared to my prior evaluations. Fortunately she showing signs of good improvement in regard to the overall wound status especially where were using the snap vac on the right. In regard to left lateral malleolus the wound bed actually appears to be much cleaner than previously noted. I do not feel any phone directly probed during evaluation today and though there is tendon noted this does not appear to be necrotic it's actually fairly good as far as the overall appearance of the tendon is concerned. In general the wound bed actually appears to be doing significantly better than it was previous. Patient is currently in the care of [name] and I did review that note today. He actually has her on two weeks of linezolid and then following the patient will be on 1-2 months of Keflex. That is the plan currently. She has been on antibiotics therapy as prescribed by myself initially starting on July 30, 2018 and has been on that continuously up to this point. 08/30/18 on evaluation today patient actually appears to be  doing much better in regard to her right lateral malleolus ulcer. She has been tolerating the dressing changes specifically the snap vac without complication although she did have some issues with the seal currently. Apparently there was some trouble with getting it to maintain over the past week past Sunday. Nonetheless overall the wound appears better in regard to the right lateral malleolus region. In regard to left lateral malleolus this actually show some signs of additional granulation although there still tendon noted in the base of the wound this appears to be healthy not necrotic in any way  whatsoever. We are considering potentially using a snap vac for the left lateral malleolus as well the product wrap from [employer], [place], was present in the clinic today we're going to see this patient I did have her come in with me after obtaining consent from the patient and her daughter in order to look at the wound and see if there's any recommendation one way or another as to whether or not they felt the snapback could be beneficial for the left lateral malleolus region. But TANARA, TURVEY ([id number]) the conclusion was that it might be but that this is definitely a little bit deeper wound than what traditionally would be utilized for a snap vac. 09/06/18 on evaluation today patient actually appears to be doing excellent in my pinion in regard to both ankle ulcers. She has been tolerating the dressing changes without complication which is great news. Specifically we have been using the snap vac. In regard to the right ankle I'm not even sure that this is going to be necessary for today and following as the wound has filled in quite nicely. In regard to the left ankle I do believe that we're seeing excellent epithelialization from the edge as well as granulation in the central portion the tendon is still exposed but there's no evidence of necrotic bone and in general I feel like the patient has made  excellent progress even compared to last week with just one week of the snap vac. 09/11/18; this is a patient who has wounds on her bilateral lateral malleoli. Initially both of these were deep stage IV wounds in the setting of chronic arterial insufficiency. She has been revascularized. As I understand think she been using snap vacs to both of these wounds however the area on the right became more superficial and currently she is only using it on the left. Using silver collagen on the right and silver collagen under the back on the left I believe 09/19/18 on evaluation today patient actually appears to be doing very well in regard to her lateral malleolus or ulcers bilaterally. She has been tolerating the dressing changes without complication. Fortunately there does not appear to be any evidence of infection at this time. Overall I feel like she is improving in an excellent manner and I'm very pleased with the fact that everything seems to be turning towards the better for her. This has obviously been a long road. 09/27/18 on evaluation today patient actually appears to be doing very well in regard to her bilateral lateral malleolus ulcers. She has been tolerating the dressing changes without complication. Fortunately there does not appear to be any evidence of infection at this time which is also great news. No fevers, chills, nausea, or vomiting noted at this time. Overall I feel like she is doing excellent with the snap vac on the left malleolus. She had 40 mL of fluid collection over the past week. 10/04/18 on evaluation today patient actually appears to be doing well in regard to her bilateral lateral malleolus ulcers. She continues to tolerate the dressing changes without complication. One issue that I see is the snap vac on the left lateral malleolus which appears to have sealed off some fluid underlying this area and has not really allowed it to heal to the degree that I would like to see. For  that reason I did suggest at this point we may want to pack a small piece of packing strip into this region to allow it to more effectively wick  out fluid. 10/11/18 in general the patient today does not feel that she has been doing very well. She's been a little bit lethargic and subsequently is having bodyaches as well according to what she tells me today. With that being said overall she has been concerned with the fact that something may be worsening although to be honest her wounds really have not been appearing poorly. She does have a new ulcer on her left heel unfortunately. This may be pressure related. Nonetheless it seems to me to have potentially started at least as a blister I do not see any evidence of deep tissue injury. In regard to the left ankle the snap vac still seems to be causing the ceiling off of the deeper part of the wound which is in turn trapping fluid. I'm not extremely pleased with the overall appearance as far as progress from last week to this week therefore I'm gonna discontinue the snap vac at this point. 10/18/18 patient unfortunately this point has not been feeling well for the past several days. She was seen by Grayland Ormond her primary care provider who is a Librarian, academic at [facility] [employer] Subsequently she states that she's been very weak and generally feeling malaise. No fevers, chills, nausea, or vomiting noted at this time. With that being said bloodwork was performed at the PCP office on the 11th of this month which showed a white blood cell count of 10.7. This was repeated today and shows a white blood cell count of 12.4. This does show signs of worsening. Coupled with the fact that she is feeling worse and that her left ankle wound is not really showing signs of improvement I feel like this is an indication that the osteomyelitis is likely exacerbating not improving. Overall I think we may also want to check her C-reactive protein and sedimentation  rate. Actually did call [facility] office this afternoon while the patient was in the office here with me. Subsequently based on the findings we discussed treatment possibilities and I think that it is appropriate for [place] to go ahead and initiate treatment with doxycycline which I'm going to do. Subsequently he did agree to see about adding a CRP and sedimentation rate to her orders. If that has not already been drawn to where they can run it they will contact the patient she can come back to have that check. They are in agreement with plan as far as the patient and her daughter are concerned. Nonetheless also think we need to get in touch with Dr. Henreitta Leber office to see about getting the patient scheduled with him as soon as possible. 11/08/18 on evaluation today patient presents for follow-up concerning her bilateral foot and ankle ulcers. I did do an extensive review of her chart in epic today. Subsequently she was seen by [name] he did initiate Cefepime IV antibiotic therapy. Subsequently she had some issues with her PICC line this had to be removed because it was coiled and then replaced. Fortunately that was now settled. Unfortunately she has continued have issues with her left heel as well as the issues that she is experiencing with her bilateral lateral malleolus regions. I do believe however both areas seem to be doing a little bit better on evaluation today which is good news. No fevers, chills, nausea, or vomiting noted at this time. She actually has an angiogram schedule with Dr. dew on this coming Monday, November 11, 2018. Subsequently the patient states that she is feeling much better  especially than what she was roughly 2 weeks ago. She actually had to cancel an appointment because she was feeling so poorly. No fevers, chills, nausea, or vomiting noted at this time. 11/15/18 on evaluation today patient actually is status post having had her angiogram with Dr. dew Monday, four days  ago. 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 He had to go and work on several areas of the vasculature fortunately he was able to obtain no more than a 30% residual stenosis throughout post procedure. I reviewed this note today. I think this will definitely help with healing at this time. Fortunately there does not appear to be any signs of infection and I do feel like ratio already has a better appearance to it. 11/22/18 upon evaluation today patient actually appears to be doing very well in regard to her wounds in general. The right lateral malleolus looks excellent the heel looks better in the left lateral malleolus also appears to be doing a little better. With that being said the right second toe actually appears to be open and training we been watching this is been dry and stable but now is open. 12/03/2018 Seen today for follow-up and management of multiple bilateral lower extremity wounds. New pressure injury of the great toe which is closed at this time. Wound of the right distal second toe appears larger today with deep undermining and a pocket of fluid present within the undermining region. Left and right malleolus is wounds are stable today with no signs and symptoms of infection.Denies any needs or concerns during exam today. 12/13/18 on evaluation today patient appears to be doing somewhat better in regard to her left heel ulcer. She also seems to be completely healed in regard to the right lateral malleolus ulcer. The left malleolus ulcer is smaller what unfortunately the wounds which are new over the first and second toes of the right foot are what are most concerning at this point especially the second. Both areas did require sharp debridement today. 12/20/18 on evaluation today patient's wound actually appears to be doing better in regard to left lateral ankle and her right lateral ankle continues to remain healed. The hill ulcer on the left is improved.  She does have improvement noted as well in regard to both toe ulcers. Overall I'm very pleased in this regard. No fevers, chills, nausea, or vomiting noted at this time. PERRIE, RAGIN ([id number]) 12/23/18 on evaluation today patient is seen after she had her toenails trimmed at the podiatrist office due to issues with her right great toe. There was what appeared to be dark eschar on the surface of the wound which had her in the podiatrist concerned. Nonetheless as I remember that during the last office visit I had utilize silver nitrate of this area I was much less concerned about the situation. Subsequently I was able to clean off much of this tissue without any complication today. This does not appear to show any signs of infection and actually look somewhat better compared to last time post debridement. Her second toe on the right foot actually had callous over and there did appear still be some fluid underneath this that would require debridement today. 12/27/18 on evaluation today patient actually appears to be showing signs of improvement at all locations. Even the left lateral ankle although this is not quite as great as the other sites. Fortunately there does not appear to be any signs of infection at this  time and both of her toes on the right foot seem to be showing signs of improvement which is good news and very pleased in this regard. 01/03/19 on evaluation today patient appears to be doing better for the most part in regard to her wounds in particular. There does not appear to be any evidence of infection at this time which is good news. Fortunately there is no sign of really worsening anywhere except for the right great toe which she does have what appears to be a bruise/deep tissue injury which is very superficial and already resolving. I'm not sure where this came from I questioned her extensively and she does not recall what may have happened with this. Other than that the patient  seems to be doing well even the left lateral ankle ulcer looks good and is getting smaller. 01/10/19 on evaluation today patient appears to be doing well in regard to her left heel wound and both of her toe wounds. Overall I feel like there is definitely improvement here and I'm happy in that regard. With that being said unfortunately she is having issues with the left lateral malleolus ulcer which unfortunately still has a lot of depth to it. This is gonna be a very difficult wound for [place] to be able to truly get to heal. I may want to consider some type of skin substitute to see if this would be of benefit for her. I'll discuss this with her more the next visit most likely. This was something I thought about more at the end of the visit when I was Artie out of the room and the patient had been discharged. 01/17/19 on evaluation today patient appears to be doing very well in regard to her wounds in general. She's been making excellent progress at this time. Fortunately there's no sign of infection at this time either. No fevers, chills, nausea, or vomiting noted at this time. The biggest issue is still her left lateral malleolus where it appears to be doing well and is getting smaller but still shows a small corner where this is deeper and goes down into what appears to be the joint space. Nonetheless this is taking much longer to heal although it still looks better in smaller than previous evaluations. 01/24/19 on evaluation today patient's wounds actually appear to be doing rather well in general overall. She did require some sharp debridement in regard to the right great toe but everything else appears to be doing excellent no debridement was even necessary. No fevers, chills, nausea, or vomiting noted at this time. 01/31/19 on evaluation today patient actually appears to be doing much better in regard to her left foot wound on the heel as well as the ankle. The right great toe appears to be a little  bit worse today this had callous over and trapped a lot of fluid underneath. Fortunately there's no signs of infection at any site which is great news. 02/07/19 on evaluation today patient actually appears to be doing decently well in regard to all of her ulcers at this point. No sharp debridement was required she is a little bit of hyper granulation in regard to the left lateral ankle as well as the left heel but the hill itself is almost completely healed which is excellent news. Overall been very pleased in this regard. 02/14/19 on evaluation today patient actually appears to be doing very well in regard to her ulcers on the right first toe, left lateral malleolus, and left heel. In  fact the heel is almost completely healed at this point. The patient does not show any signs of infection which is good news. Overall very pleased with how things have progressed. 04/18/19 Telehealth Evaluation During the COVID-19 National Emergency: Verbal Consent: Obtained from patient Allergies: reviewed and the active list is current. Medication changes: patient has no current medication changes. COVID-19 Screening: 1. Have you traveled internationally or on a cruise ship in the last 14 dayso No 2. Have you had contact with someone with or under investigation for COVID-19o No 3. Have you had a fever, cough, sore throat, or experiencing shortness of breatho No on evaluation today actually did have a visit with this patient through a telehealth encounter with her home health nurse. Subsequently it was noted that the patient actually appears to be doing okay in regard to her wounds both the right great toe as well as the left lateral malleolus have shown signs of improvement although this in your theme around the left lateral malleolus there eschar coverings for both locations. The question is whether or not they are actually close and whether or not home health needs to discharge the patient or not. Nonetheless my  concern is this point obviously is that without actually seeing her and being able to evaluate this directly I cannot ensure that she is completely healed which is the question that I'm being asked. 04/22/19 on evaluation today patient presents for her first evaluation since last time I saw her which was actually February 14, 2019. I did do a telehealth visit last week in which point it was questionable whether or not she may be healed and had to bring her in today for confirmation. With that being said she does seem to be doing quite well at this point which is good news. There does not appear to be any drainage in the deed I believe her wounds may be healed. Readmission: 09/04/2019 on evaluation today patient appears to be doing unfortunately somewhat more poorly in regard to her left foot ulcer secondary to a wound that began on 08/21/2019 at least when she first noticed this. Fortunately she has not had any evidence of active infection at this time. Systemically. I also do not necessarily see any evidence of infection at the blister/wound site on the first metatarsal head plantar aspect. This almost appears to be something that may have just rubbed inappropriately causing this to breakdown. They did not want a wait too long to come in to be seen as again she had significant issues in the past with wounds that took quite a while to heal in fact it was close to 2 years. Nonetheless this does not appear to be quite that bad but again we do need to remove some of the necrotic tissue from the surface of the wound to tell exactly the extent. She does not appear to have any significant arterial disease at this point and again her last ABIs and TBI's are recorded above in the alert section her left ABI was 1.27 with a TBI of 0.72 to the right ABI 1.08 with a TBI of 0.39. Other than this the patient has been doing quite MIKAIA, JANVIER ([id number]) well since I last saw her and that was in May  2020. 09/11/2019 on evaluation today patient appeared to be doing very well with regard to her plantar foot ulcer on the left. In fact this appears to be almost completely healed which is awesome. That is after just 1 week of  intervention. With that being said there is no signs of active infection at this time. 09/18/2019 on evaluation today patient actually appears to be doing excellent in fact she is completely healed based on what I am seeing at this point. Fortunately there is no signs of active infection at this time and overall patient is very pleased to hear that this area has healed so quickly. Readmission: 05/13/2021 upon evaluation today this patient presents for reevaluation here in the clinic. This is a wound that actually we previously took care of. She had 1 on the right ankle and the left the left turned out to be be harder due to to heal but nonetheless is doing great at this point as the right that has reopened and it was noted first just several weeks ago with a scab over it and came off in just the past few days. Fortunately there does not appear to be any obvious evidence of significant active infection at this time which is great news. No fevers, chills, nausea, vomiting, or diarrhea. 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 There does not appear to be any signs of this interfering in any way with her wound. She does have swelling we previously had compression socks for her ordered but again it does not look like she wears these on a regular basis by any means. 05/26/2021 upon evaluation today patient appears to be doing well with regard to her wound which is actually showing signs of excellent improvement. There does not appear to be any signs of active infection which is great news and overall very pleased with where things stand today. No fevers, chills, nausea, vomiting, or diarrhea. 06/02/2021 upon evaluation today patient's  wound actually showing signs of excellent improvement. Fortunately there does not appear to be any signs of active infection which is great news. I think the patient is making good progress with regard to her wounds in general. 06/09/2021 upon evaluation today patient appears to be doing excellent in regard to her wounds currently. Fortunately there does not appear to be any signs of active infection which is great news. No fevers, chills, nausea, vomiting, or diarrhea. Overall extremely pleased with where things stand today. I think the patient is making excellent progress. 06/16/2021 upon evaluation today patient appears to be doing well in regard to her wound. This is going require little bit of debridement today and that was discussed with the patient. Otherwise she seems to be doing quite well and I am actually very pleased with where things stand at this point. No fevers, chills, nausea, vomiting, or diarrhea. 06/23/2021 upon evaluation today patient appears to be doing well with regard to her wounds. She has been tolerating the dressing changes without complication. Fortunately there does not appear to be any evidence of infection and she has not had air in her home which she actually lives at an assisted living that got fixed this morning. With that being said because of that her wrap has been extremely hot and bothersome for her over the past week. 06/30/2021 upon evaluation today patient is actually making excellent progress in regard to her ankle ulcer. She has been tolerating the dressing changes without complication and overall extremely pleased with where things stand there does not appear to be any evidence of active infection which is great news. No fevers, chills, nausea, vomiting, or diarrhea. 07/07/21 upon evaluation today patients and culture on the right actually appears to be  doing quite well. There does not appear to be any signs of infection and overall very pleased with where things  stand today. No fevers, chills, nausea, or vomiting noted at this time. 07/14/2021 unfortunately the patient today has some evidence of deep tissue injury and pressure getting to the ankle region. Again I am not exactly sure what is going on here but this is very similar to issues that we have had in the past. I explained to the patient that she needs to be very mindful of exactly what is happening I think sleeping in bed is probably the main issue here although there could be other culprits I am not sure what else would potentially lead to this kind of a problem for her. 07/21/2021 upon evaluation today patient's wound actually showing signs of improvement compared to last week. Fortunately there does not appear to be any signs of active infection which is great news and overall very pleased with where things stand in that regard. With that being said I do believe that she is continuing to show signs of overall of getting better although I think this is still basically about what we were 2 weeks ago due to the worsening and now improvement. 07/28/2021 upon evaluation today patient appears to be doing well with regard to her wound. She does have some slough buildup on the surface of the wound which I would have to manage today. Fortunately there is no sign of active infection at this time. No fevers, chills, nausea, vomiting, or diarrhea. 08/04/2021 upon evaluation today patient appears to be doing about the same in regard to her wound. To be perfectly honest I am beginning to be a little bit concerned about the overall appearance of the wound bed. I do think possibly taking a sample right around the margin of the wound could be beneficial for her as far as identifying anything such as an inflammatory process or to be honest even a skin tag cancer type process that may be of concern here. Fortunately there does not appear to be any evidence of active infection at this time which is great news she is  not having any pain also great news. 08/11/2021 upon evaluation today patient appears to be doing well with regard to her wound. The good news is I did review her biopsy results and it showed some inflammatory mixed findings but nothing that appeared to be malignant which is great news. Overall this is more of a chronic venous stasis type issue which again is more what we have been treating. Nonetheless I just wanted to make sure before going forward that there was not anything more untoward going on at this point. 08/18/2021 upon evaluation today patient appears to be doing well with regard to her ankle ulcer. Fortunately there does not appear to be any signs of active infection at this time which is great overall wound is dramatically improved compared to last week. Since last week I have actually placed her on doxycycline and subsequently this is a good option as far as the findings are concerned at this point. 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 the doxycycline is doing that. There does not appear to be any evidence of active infection systemically which is great news. 08/25/2021 upon evaluation today patient appears to be doing well with regard to her wound. I feel  like we are finally get back on track as far as healing is concerned I am much happier with the overall appearance today. I do think that she is tolerating the dressing changes without complication which is great news. We have been using Hydrofera Blue which I think is a good option. The good news is she is also doing great in [place], JORDANE HISLE. ([id number]) regard to her compression sock on the left which is a zipper compression that seems to be doing a great job keeping her edema under good control. 09/01/2021 upon evaluation today patient appears to be doing well with regard to her wound. Infection seems  to be under much better control which is great news and very pleased in that regard. Fortunately there does not appear to be any signs of infection currently. 09/08/2021 upon evaluation today patient actually appears to be making good progress in regard to her wound. She has been tolerating the dressing changes without complication. Fortunately there does not appear to be any evidence of active infection at this time which is great news as well. No fevers, chills, nausea, vomiting, or diarrhea. 09/22/2021 upon evaluation today patient appears to be doing well with regard to her wound although is very slow to heal. We have not looked into Apligraf yet I think that is something that we should see about doing. 09/29/2021 upon evaluation today patient's wound is actually showing signs of doing about the same. I am not seeing any evidence of worsening but also no significant evidence of improvement. We did gain approval for the organogenesis products all except for Apligraf as covered by her insurance. With that being said I do think that we can go ahead and proceed with the NuShield if the patient and her family in agreement of the plan I discussed that with him today she does have a 20% coinsurance which we also discussed. 10/06/2021 upon evaluation today patient appears to unfortunately be doing a little bit worse she appears to be infected based on what I am seeing. Fortunately there does not appear to be any signs of active infection at this time which is great news. Unfortunately it does appear to be some evidence of around the wound edge indicated by way of erythema and warmth as well as redness 10/13/2021 upon evaluation today patient actually appears to be doing excellent in regard to her ankle ulcer compared to what it was. Fortunately though she does have evidence of infection, MRSA, on the culture which I did review this overall should be managed by the antibiotic that I given her which was the  doxycycline and again today this seems to be doing much better. I think were fine to go ahead and apply the NuShield today. 10/20/2021 upon evaluation today patient appears to be doing excellent in fact the NuShield seems to have done all some for her thus far. I am actually very pleased with where we stand and overall I think that she is making great progress. There is no evidence of active infection at this time. 11/23; patient presents for follow-up. She has no issues or complaints today. She denies signs of infection. She reports stability in her wound healing. 11/03/2021 upon evaluation today patient appears to be doing well with regard to her wounds. She has been tolerating the dressing changes without complication. Fortunately there is no signs of active infection at this time. 11/10/2021 upon evaluation today patient appears to be doing well with regard to her right ankle which is actually showing  signs of improvement with the NuShield I am very pleased. Subsequently in regards to the left ankle this appears to be doing okay with no evidence of issue here either. With that being said she has a significant contusion on the left leg from having fallen 2 days ago. She tells me that she was using her walker going to the closet and then when she got to the closet turned around to get something out at which point she fell according to the story. Nonetheless she does have a hematoma just below her knee on the anterior portion of her shin. My hope is that this will not open until wound although we do need I think Some compression over it also think that we need to have her use an ice as well to help with the swelling and prevent this from getting worse. The last thing she needs is a wound opened up here. 11/17/2021 upon evaluation patient's right ankle actually showing signs of improvement based on what I see currently there is a lot of new skin growth coming in which is great news. Nonetheless I do  feel like that the patient is showing improvement as well in regard to her left leg with the use of the Tubigrip it swollen but not as bruised as what I would have expected after what I was seeing last week. Nonetheless I do think doubling up on the Tubigrip would probably be beneficial to try to keep some of the edema under better control here. 11/24/2021 upon evaluation today patient appears to be doing excellent in regard to her wound. This is actually measuring significantly smaller which is great news. Fortunately I do not see any signs of active infection locally nor systemically at this point. Overall I am very pleased with how the NuShield is doing. 12/30; wound bed looks healthy however not much change in overall wound volume. We applied Nushield again today in the standard fashion 12/08/2021 upon evaluation today patient's wound actually showing signs of good improvement and I am actually very pleased with where we stand today as well. I do not see any evidence of active infection locally nor systemically which is great news. Unfortunately she has been having some issues with stomach upset and diarrhea today. 12/15/2021 upon evaluation today patient appears to be doing excellent in regard to her wound. There is a little bit of dry skin raised up around the edges of the wound but fortunately nothing too significant at this point. Fortunately I do not see any evidence of active infection either which is great news. No fevers, chills, nausea, vomiting, or diarrhea. 12/29/2021 upon evaluation today patient appears to be doing well with regard to her wound. In general I feel like she is getting very close to complete resolution. I think that she is making good progress here as well. Overall I do not see any signs of infection and very little of this appears to actually be open. 01/12/2022 upon evaluation today patient appears to be doing a little bit worse in regard to her wound. It appears that the  collagen actually trapped fluid underneath the collagen which got hard and subsequently caused the fluid to back up. This patient has made the wound appear to be larger than what it was previous. With that being said I do not see any signs of obvious infection is not warm to touch and not significantly erythematous either which is good news. Nonetheless I do think that we will need to continue to keep  an eye on this. Probably not go put on antibiotic this week but I will be too far from doing so she is actually on a Z-Pak right now I do not want to really double up on antibiotics. 01/26/2022 upon evaluation today patient's wound is showing signs of improvement although this is slow to turn back around. Fortunately there does not appear to be any evidence of active infection locally or systemically which is great news and overall I am extremely pleased with where we stand today. No fevers, chills, nausea, vomiting, or diarrhea. 02/02/2022 upon evaluation today patient appears to be doing well with regard to her wound. She has been tolerating the dressing changes without complication. Fortunately there does not appear to be any signs of active infection locally nor systemically at this time which is great news. No fevers, chills, nausea, vomiting, or diarrhea. LEOMA, FOLDS ([id number]) 3/9; patient presents for follow-up. She has no issues or complaints today. She denies signs of infection. 02/16/2022 upon evaluation today patient appears to be doing well with regard to her wound. She has been tolerating the dressing changes without complication. Fortunately I do not see any evidence of active infection locally nor systemically at this point which is great news. Overall I think that the patient is making excellent progress in general. The wound is measuring smaller and though there does appear to be some need for sharp debridement today this is minimal compared to what we have noted  previous. 02/23/2022 upon evaluation today patient appears to be doing well with regard to her wound she is definitely showing signs of improvement which is great news. 03/02/2022 upon evaluation today patient appears to be doing about the same in regard to her wound. Unfortunately were not seeing significant improvement. With that being said is also not significantly worse which is great news. No fevers, chills, nausea, vomiting, or diarrhea. 03-09-2022 upon evaluation today patient appears to be doing well with regard to her wound. She has been tolerating the dressing changes without complication. Fortunately there does not appear to be any signs of active infection locally or systemically which is great news. 03-16-2022 upon evaluation today patient appears to be doing well with regard to her wound this is measuring smaller and looking much better. I am actually very pleased with where we stand and I think that the patient is making great progress. I do not see any evidence of active infection locally or systemically which is great news. No fevers, chills, nausea, vomiting, or diarrhea. 03-23-2022 upon evaluation today patient appears to be doing better in regard to her wounds. In fact the wound area actually is showing signs of excellent improvement and actually very pleased with where we stand today. I do not see any evidence of active infection locally nor systemically which is great news. 4/27; comes in today with again thick callus around the wound circumference which I removed with a curette. Some debris on the surface. Overall I do not think quite as good as it was last week by description. Using endoform 04-06-2022 upon evaluation today patient appears to be doing pretty well in regard to her wound. Fortunately I do not see any evidence of active infection locally or systemically which is great news and overall I am pleased in that regard. I do feel like this is measuring smaller postdebridement  compared to where we were previous. Overall she is headed in the right direction. She does have an area that is threatening to open and looks  a little irritated on the left ankle we did measure this just for the discolored area for now its not draining too much but we want to monitor and make sure nothing worsens here. 04-13-2022 upon evaluation today patient appears to be doing better in regard to her wound although this is still very slow to heal. We have reapplied for a skin substitute but have not heard anything back as of yet. Fortunately I do not see any evidence of active infection locally or systemically at this time which is great news. 04-20-2022 upon evaluation today patient's wound is actually showing signs of significant improvement which is great news. We did get approval for skin substitutes which I think is an option here for [place] but at the same time I am not even certain that this is going to be necessary especially considering how well things seem to be doing at this point. If she continues healing as we see her right now but I think were probably going to be able to avoid any need for additional skin substitute. Patient and her daughter are both in agreement with that plan. With that being said she did have a fall she has several areas of contusion fortunately nothing that is can require intervention at this point but nonetheless she did also fracture her rib which is extremely uncomfortable obviously. 04-27-2022 upon evaluation today patient appears to be doing well currently in regard to her wound on the right lateral ankle. Overall I feel like she is actually doing significantly better even compared to last week and very pleased in this regard. I think we are on the right track here. 05-04-2022 upon evaluation today patient appears to be doing well with regard to her wound. She is going require some sharp debridement today to clear away some of the necrotic debris. Fortunately I was  able to do this quite easily without significant issue or breakdown here. There is a very small area of opening centrally but really this is pretty close to getting close. 05-11-2022 upon evaluation today patient's wound is actually showing signs of excellent improvement. I do not see any evidence of infection locally or systemically at this time. Overall I think she is very close to complete resolution with just a little bit of debridement to clear away some of the dry crusty area around the edges of the wound currently. 05-18-2022 upon evaluation patient's wound bed actually appears to be likely healed although I cannot be 100% sure I am extremely pleased however with where things stand today. I do not see any evidence of infection locally or systemically which is great news and overall I think you are on the right track here. 05-25-2022 upon evaluation today patient appears to be doing well with regard to her leg ulcer. Fortunately there does not appear to be any signs of active infection locally or systemically at this time which is great news. No fevers, chills, nausea, vomiting, or diarrhea. With that being said she does appear to be healed in regard to her right lateral ankle which is great news. In regard to her leg unfortunately during the removal of the compression wrap there was a small skin tear that occurred with the scissors. Subsequently the patient does have a minimal amount of bleeding this is going require a couple Steri-Strips in order to seal this up. I do think it should likely heal quite well. Obviously this was unintentional. Nonetheless we do have to definitely monitor make sure that this completely heals  up as well I am hopeful that we will be the case by next week to be honest. 06-01-2022 upon evaluation today patient appears to be doing excellent in regard to her wounds. Both areas are completely healed which is great news. Fortunately I do not see any signs of active infection  locally or systemically at this time which is great and overall I am extremely pleased in that regard. No fevers, chills, nausea, vomiting, or diarrhea. 7/25; patient returns to clinic today with a story that 2 to 3 days ago she was sitting in her chair and she noticed bleeding from her left lower leg lateral aspect. She denies any trauma. She used pressure and a Band-Aid to get it to stop and she arrives in clinic today in follow-up. She was discharged on 6/29 apparently to stockings although she did not have any on and I do not think she was actually wearing them. She has significant chronic venous insufficiency 07-04-2022 upon evaluation today patient presents for follow-up. In the clinic she actually was seen last week when I was on vacation due to an area on her leg which reopened unfortunately. With that being said she does seem to be doing a little bit better but unfortunately this is still [place], [name] ([id number]) giving her some trouble here. All of her other wounds are still closed which I was previously seen her for and that is great news. 07-13-2022 upon evaluation today patient appears to be doing well with regard to her wound everything appears to be healed unfortunately she has a completely new area on her left heel which is a blister. Fortunately I do not see any signs of infection but unfortunately I do think this is going to be ongoing issue at this time. Question has been made whether or not we can get this to reabsorb or not. Again right now I am going to treat this more as a deep tissue injury currently. 07-20-2022 upon evaluation today patient's wound is actually showing signs of being somewhat worse I definitely think she is infected at this point. We will get a need to subsequently have her started on antibiotic as soon as possible. She voiced understanding and her daughter is in agreement with this plan. We were hoping that we would get the blister reabsorb that just did  not happen. 07-27-2022 upon evaluation today patient's ankle ulcer unfortunately is not doing nearly as well as well would like to see. Fortunately there does not appear to be any signs of infection locally or systemically at this time which is great news and overall I am extremely pleased. With that being said unfortunately the patient continues to have issues with her left lateral ankle which is now open and subsequently the heel which again is of concern here. 08-03-2022 upon evaluation today patient appears to be doing better in regard to the heel though she is have some issues in regard to this left lateral ankle region. Unfortunately I think that this is just going to take some time I think we probably need to add Santyl to the regimen. 08-10-2022 upon evaluation today patient unfortunately appears to be doing much worse in regard to her ankle. The heel is looking okay and I think were doing decently well in that regard. The ankle on the other hand is completely different story. I am definitely not nearly as pleased with this. Objective Constitutional Well-nourished and well-hydrated in no acute distress. Vitals Time Taken: 10:37 AM, Height: 62 in, Weight: 150  lbs, BMI: 27.4, Temperature: 98.0 F, Pulse: 60 bpm, Respiratory Rate: 18 breaths/min, Blood Pressure: 130/72 mmHg. Respiratory normal breathing without difficulty. Psychiatric this patient is able to make decisions and demonstrates good insight into disease process. Alert and Oriented x 3. pleasant and cooperative. General Notes: Again patient's heel ulcer is actually showing signs of significant improvement which is great news and very pleased in that regard. With regard to the ankle this is not doing nearly as well. I really think we probably need to see about getting an x-ray of the ankle in order to further evaluate the situation. Patient voiced understanding and her daughter [name] was present during the evaluation as  well. Integumentary (Hair, Skin) Wound #11 status is Open. Original cause of wound was Pressure Injury. The date acquired was: 07/20/2022. The wound has been in treatment 3 weeks. The wound is located on the Left Calcaneus. The wound measures 3.9cm length x 2cm width x 0.1cm depth; 6.126cm^2 area and 0.613cm^3 volume. There is Fat Layer (Subcutaneous Tissue) exposed. There is a small amount of serosanguineous drainage noted. There is small (1-33%) pink granulation within the wound bed. There is a large (67-100%) amount of necrotic tissue within the wound bed including Eschar. Wound #12 status is Open. Original cause of wound was Gradually Appeared. The date acquired was: 07/17/2022. The wound has been in treatment 3 weeks. The wound is located on the Left,Lateral Ankle. The wound measures 4.6cm length x 2cm width x 0.2cm depth; 7.226cm^2 area and 1.445cm^3 volume. There is Fat Layer (Subcutaneous Tissue) exposed. There is a medium amount of serosanguineous drainage noted. There is medium (34-66%) red granulation within the wound bed. There is a medium (34-66%) amount of necrotic tissue within the wound bed. Assessment Active Problems ICD-10 Non-pressure chronic ulcer of other part of left lower leg with other specified severity Contusion of left lower leg, subsequent encounter Pressure ulcer of left heel, stage 3 Lymphedema, not elsewhere classified Venous insufficiency (chronic) (peripheral) Presence of cardiac pacemaker Long term (current) use of anticoagulants [place], [name] ([id number]) Plan Follow-up Appointments: Return Appointment in 1 week. Home Health: Wound #11 Left Calcaneus: [place] ADMIT to [facility] for wound care. May utilize formulary equivalent dressing for wound treatment orders unless otherwise specified. Home Health Nurse may visit [facility] to address patient s wound care needs. [facility] for wound care. May utilize formulary  equivalent dressing for wound treatment orders unless otherwise specified. Home Health Nurse may visit [facility] to address patient s wound care needs. **Please direct any NON-WOUND related issues/requests for orders to patient's Primary Care Physician. **If current dressing causes regression in wound condition, may D/C ordered dressing product/s and apply Normal Saline Moist Dressing daily until next [place] or Other MD appointment. **Notify [facility] of regression in wound condition at [phone]. Wound #12 Left,Lateral Ankle: [place] ADMIT to [facility] for wound care. May utilize formulary equivalent dressing for wound treatment orders unless otherwise specified. Home Health Nurse may visit [facility] to address patient s wound care needs. [facility] for wound care. May utilize formulary equivalent dressing for wound treatment orders unless otherwise specified. Home Health Nurse may visit [facility] to address patient s wound care needs. **Please direct any NON-WOUND related issues/requests for orders to patient's Primary Care Physician. **If current dressing causes regression in wound condition, may D/C ordered dressing product/s and apply Normal Saline Moist Dressing daily until next [place] or Other  MD appointment. **Notify [facility] of regression in wound condition at [phone]. Bathing/ Shower/ Hygiene: May shower with wound dressing protected with water repellent cover or cast protector. No tub bath. Anesthetic (Use 'Patient Medications' Section for Anesthetic Order Entry): Lidocaine applied to wound bed Edema Control - Lymphedema / Segmental Compressive Device / Other: Tubigrip single layer applied. - Tubi D single layer left lower leg Elevate, Exercise Daily and Avoid Standing for Long Periods of Time. Elevate legs to the level of the heart and pump ankles as often as possible Elevate leg(s) parallel to the floor  when sitting. Non-Wound Condition: Cleanse affected area with antibacterial soap and water, Medications-Please add to medication list.: P.O. Antibiotics - continue Doxycycline Radiology ordered were: X-ray, ankle - left ankle The following medication(s) was prescribed: linezolid oral 600 mg tablet 1 1 tablet oral taken 2 times per day for 30 days starting 08/10/2022 WOUND #11: - Calcaneus Wound Laterality: Left Cleanser: Byram Ancillary Kit - 15 Day Supply (Generic) 1 x Per Day/30 Days Discharge Instructions: Use supplies as instructed; Kit contains: (15) Saline Bullets; (15) 3x3 Gauze; 15 pr Gloves Topical: Santyl Collagenase Ointment, 30 (gm), tube 1 x Per Day/30 Days Discharge Instructions: apply nickel thick to wound bed only Primary Dressing: Xeroform 4x4-HBD (in/in) (Generic) 1 x Per Day/30 Days Discharge Instructions: Apply Xeroform 4x4-HBD (in/in) as directed Secondary Dressing: ABD Pad 5x9 (in/in) (Generic) 1 x Per Day/30 Days Discharge Instructions: Cover with ABD pad Secondary Dressing: Gauze 1 x Per Day/30 Days Discharge Instructions: As directed: dry, moistened with saline or moistened with Dakins Solution Secured With: Conform 4'' - Conforming Stretch Gauze Bandage 4x75 (in/in) 1 x Per Day/30 Days Discharge Instructions: Apply as directed Secured With: Tubigrip Size D, 3x10 (in/yd) 1 x Per Day/30 Days Discharge Instructions: single layer WOUND #12: - Ankle Wound Laterality: Left, Lateral Cleanser: Byram Ancillary Kit - 15 Day Supply (Generic) 1 x Per Day/30 Days Discharge Instructions: Use supplies as instructed; Kit contains: (15) Saline Bullets; (15) 3x3 Gauze; 15 pr Gloves Topical: Santyl Collagenase Ointment, 30 (gm), tube 1 x Per Day/30 Days Discharge Instructions: apply nickel thick to wound bed only Primary Dressing: Xeroform 4x4-HBD (in/in) (DME) (Dispense As Written) 1 x Per Day/30 Days Discharge Instructions: Apply Xeroform 4x4-HBD (in/in) as directed Secondary  Dressing: ABD Pad 5x9 (in/in) (DME) (Generic) 1 x Per Day/30 Days Discharge Instructions: Cover with ABD pad Secondary Dressing: Gauze 1 x Per Day/30 Days Discharge Instructions: As directed: dry, moistened with saline or moistened with Dakins Solution Secured With: Medipore Tape - 43M Medipore H Soft Cloth Surgical Tape, 2x2 (in/yd) (Generic) 1 x Per Day/30 Days Secured With: Conform 4'' - Conforming Stretch Gauze Bandage 4x75 (in/in) (Generic) 1 x Per Day/30 Days Discharge Instructions: Apply as directed Secured With: Tubigrip Size D, 3x10 (in/yd) 1 x Per Day/30 Days Discharge Instructions: single layer Sarinana, [name] ([id number]) 1. Based on what I am seeing currently I am good recommend that we have the patient go ahead and initiate treatment with a continuation of the Santyl followed by the Xeroform gauze I am recommending currently that the patient should also go for an x-ray of her ankle. I am concerned about the possibility of osteomyelitis. 2. I am also can recommend at this point that we have the patient go ahead and continue with the ABD pad to cover over top of the Santyl and Xeroform which I do believe is something that will help catch any of the excess drainage and provide some  cushion as well. 3. I am also going to suggest the patient should monitor for any signs of worsening or infection. Obviously if anything changes she should let me know soon as possible. We will see patient back for reevaluation in 1 week here in the clinic. If anything worsens or changes patient will contact our office for additional recommendations. The patient unfortunately did have some interaction with the linezolid and the new medication that she was prescribed for appetite stimulation by Dr. Ola Spurr this was the mirtazapine. I actually called back to speak with [name] in order to discuss this with her and the medication was being used just for appetite stimulation for the time being she feels like  it would best to go ahead and get her on the stronger antibiotic which I agree with. For that reason I did contact Arbie Cookey [facility] who is the pharmacist and let her know she is going to go ahead and fill the linezolid and we will hold off on the mirtazapine for the time being. In the meantime until they get the new medication patient should still continue with the doxycycline as before. Electronic Signature(s) Signed: 08/10/2022 2:31:44 PM By: Worthy Keeler PA-C Entered By: Worthy Keeler on 08/10/2022 14:31:43 Nowakowski, [name] ([id number]) -------------------------------------------------------------------------------- SuperBill Details Patient Name: [name] Date of Service: 08/10/2022 Medical Record Number: [id number] Patient Account Number: [id number] Date of Birth/Sex: Aug 02, 1923 (88 y.o. F) Treating RN: Cornell Barman Primary Care Provider: Adrian Prows Other Clinician: Massie Kluver Referring Provider: Adrian Prows Treating Provider/Extender: Skipper Cliche in Treatment: 64 Diagnosis Coding ICD-10 Codes Code Description [id number] Non-pressure chronic ulcer of other part of left lower leg with other specified severity S80.12XD Contusion of left lower leg, subsequent encounter L89.623 Pressure ulcer of left heel, stage 3 I89.0 Lymphedema, not elsewhere classified I87.2 Venous insufficiency (chronic) (peripheral) Z95.0 Presence of cardiac pacemaker Z79.01 Long term (current) use of anticoagulants Facility Procedures CPT4 Code: [id number] Description: 99214 - WOUND CARE VISIT-LEV 4 EST PT Modifier: Quantity: 1 Physician Procedures CPT4 Code: 9022840 Description: 99214 - WC PHYS LEVEL 4 - EST PT Modifier: Quantity: 1 CPT4 Code: Description: ICD-10 Diagnosis Description L97.828 Non-pressure chronic ulcer of other part of left lower leg with other spe S80.12XD Contusion of left lower leg, subsequent encounter L89.623 Pressure ulcer of left  heel, stage 3 I89.0 Lymphedema, not  elsewhere classified Modifier: cified severity Quantity: Electronic Signature(s) Signed: 08/10/2022 2:31:57 PM By: Worthy Keeler PA-C Entered By: Worthy Keeler on 08/10/2022 14:31:57

## 2022-08-16 ENCOUNTER — Emergency Department
Admission: EM | Admit: 2022-08-16 | Discharge: 2022-08-16 | Disposition: A | Discharge status: Home / Self Care | Payer: Medicare Other | Attending: Emergency Medicine | Admitting: Emergency Medicine

## 2022-08-16 ENCOUNTER — Emergency Department: Payer: Medicare Other

## 2022-08-16 ENCOUNTER — Other Ambulatory Visit: Payer: Self-pay

## 2022-08-16 DIAGNOSIS — E860 Dehydration: Secondary | ICD-10-CM | POA: Insufficient documentation

## 2022-08-16 DIAGNOSIS — Z20822 Contact with and (suspected) exposure to covid-19: Secondary | ICD-10-CM | POA: Diagnosis not present

## 2022-08-16 DIAGNOSIS — M6281 Muscle weakness (generalized): Secondary | ICD-10-CM | POA: Diagnosis present

## 2022-08-16 DIAGNOSIS — R531 Weakness: Secondary | ICD-10-CM

## 2022-08-16 LAB — CBC WITH DIFFERENTIAL/PLATELET
Abs Immature Granulocytes: 0.04 10*3/uL (ref 0.00–0.07)
Basophils Absolute: 0 10*3/uL (ref 0.0–0.1)
Basophils Relative: 0 %
Eosinophils Absolute: 0.1 10*3/uL (ref 0.0–0.5)
Eosinophils Relative: 1 %
HCT: 40.8 % (ref 36.0–46.0)
Hemoglobin: 12.8 g/dL (ref 12.0–15.0)
Immature Granulocytes: 0 %
Lymphocytes Relative: 19 %
Lymphs Abs: 1.8 10*3/uL (ref 0.7–4.0)
MCH: 30.5 pg (ref 26.0–34.0)
MCHC: 31.4 g/dL (ref 30.0–36.0)
MCV: 97.1 fL (ref 80.0–100.0)
Monocytes Absolute: 1 10*3/uL (ref 0.1–1.0)
Monocytes Relative: 10 %
Neutro Abs: 6.5 10*3/uL (ref 1.7–7.7)
Neutrophils Relative %: 70 %
Platelets: 266 10*3/uL (ref 150–400)
RBC: 4.2 MIL/uL (ref 3.87–5.11)
RDW: 14.3 % (ref 11.5–15.5)
WBC: 9.4 10*3/uL (ref 4.0–10.5)
nRBC: 0 % (ref 0.0–0.2)

## 2022-08-16 LAB — LACTIC ACID, PLASMA: Lactic Acid, Venous: 1.8 mmol/L (ref 0.5–1.9)

## 2022-08-16 LAB — COMPREHENSIVE METABOLIC PANEL
ALT: 14 U/L (ref 0–44)
AST: 22 U/L (ref 15–41)
Albumin: 3 g/dL — ABNORMAL LOW (ref 3.5–5.0)
Alkaline Phosphatase: 115 U/L (ref 38–126)
Anion gap: 7 (ref 5–15)
BUN: 20 mg/dL (ref 8–23)
CO2: 23 mmol/L (ref 22–32)
Calcium: 9.1 mg/dL (ref 8.9–10.3)
Chloride: 105 mmol/L (ref 98–111)
Creatinine, Ser: 1.2 mg/dL — ABNORMAL HIGH (ref 0.44–1.00)
GFR, Estimated: 41 mL/min — ABNORMAL LOW (ref >60)
Glucose, Bld: 103 mg/dL — ABNORMAL HIGH (ref 70–99)
Potassium: 4.1 mmol/L (ref 3.5–5.1)
Sodium: 135 mmol/L (ref 135–145)
Total Bilirubin: 0.5 mg/dL (ref 0.3–1.2)
Total Protein: 7.2 g/dL (ref 6.5–8.1)

## 2022-08-16 LAB — RESP PANEL BY RT-PCR (FLU A&B, COVID) ARPGX2
Influenza A by PCR: NEGATIVE
Influenza B by PCR: NEGATIVE
SARS Coronavirus 2 by RT PCR: NEGATIVE

## 2022-08-16 LAB — PROTIME-INR
INR: 1.3 — ABNORMAL HIGH (ref 0.8–1.2)
Prothrombin Time: 16.4 seconds — ABNORMAL HIGH (ref 11.4–15.2)

## 2022-08-16 LAB — APTT: aPTT: 32 seconds (ref 24–36)

## 2022-08-16 MED ORDER — ONDANSETRON 4 MG PO TBDP: 4.0000 mg | ORAL_TABLET | Freq: Three times a day (TID) | ORAL | 0 refills | Status: AC | PRN

## 2022-08-16 MED ORDER — SODIUM CHLORIDE 0.9 % IV SOLN
2.0000 g | INTRAVENOUS | Status: DC
  Administered 2022-08-16: 2 g via INTRAVENOUS
  Filled 2022-08-16: qty 20

## 2022-08-16 MED ORDER — SODIUM CHLORIDE 0.9 % IV BOLUS (SEPSIS)
1000.0000 mL | Freq: Once | INTRAVENOUS | Status: AC
  Administered 2022-08-16: 1000 mL via INTRAVENOUS

## 2022-08-16 MED ORDER — LACTATED RINGERS IV BOLUS
1000.0000 mL | Freq: Once | INTRAVENOUS | Status: AC
  Administered 2022-08-16: 1000 mL via INTRAVENOUS

## 2022-08-16 NOTE — ED Triage Notes (Signed)
Pt presents to the ED via EMS due increasing weakness. Pt had a fall on Monday, denies LOC. Pt denies N/V/D. A&O x4.

## 2022-08-16 NOTE — Progress Notes (Signed)
CODE SEPSIS - PHARMACY COMMUNICATION  **Broad Spectrum Antibiotics should be administered within 1 hour of Sepsis diagnosis**  Time Code Sepsis Called/Page Received: 0920  Antibiotics Ordered: Ceftriaxone  Time of 1st antibiotic administration: 0910  Additional action taken by pharmacy: N/A  Gretel Acre, PharmD PGY1 Pharmacy Resident 08/16/2022 9:32 AM

## 2022-08-16 NOTE — Sepsis Progress Note (Signed)
Secure chat with bedside RN, who stated that blood cultures were drawn prior to giving the antibiotic.

## 2022-08-16 NOTE — ED Provider Notes (Signed)
[facility] Provider Note   None    (approximate) History  Weakness and Fall  HPI [name] is a 88 y.o. female with a past medical history of paroxysmal atrial fibrillation, hyperlipidemia, and left lateral ankle chronic ulcer who presents via EMS from her independent living facility with complaints of generalized weakness.  Patient had a fall 2 days prior to arrival and states that she has been extremely weak since that time.  Patient also states that she has been taking antibiotics for her left lower extremity ulcer and feels that her weakness has been getting worse over that time as well.  Patient denies any exacerbating or relieving factors. ROS: Patient currently denies any vision changes, tinnitus, difficulty speaking, facial droop, sore throat, chest pain, shortness of breath, abdominal pain, nausea/vomiting/diarrhea, dysuria, or numbness/paresthesias in any extremity   Physical Exam  Triage Vital Signs: ED Triage Vitals [08/16/22 0915]  Enc Vitals Group     BP      Pulse      Resp      Temp      Temp src      SpO2 95 %     Weight      Height      Head Circumference      Peak Flow      Pain Score      Pain Loc      Pain Edu?      Excl. in [place]?    Most recent vital signs: Vitals:   08/16/22 1100 08/16/22 1130  BP: 136/64 135/67  Pulse: 63 63  Resp: 10 10  Temp:    SpO2: 97% 97%   General: Awake, oriented x4. CV:  Good peripheral perfusion.  Resp:  Normal effort.  Abd:  No distention.  Other:  Elderly Caucasian female laying in bed in no acute distress.  There is a 3 cm diameter ulceration over the lateral malleolus of the left ankle with dressing in place ED Results / Procedures / Treatments  Labs (all labs ordered are listed, but only abnormal results are displayed) Labs Reviewed  COMPREHENSIVE METABOLIC PANEL - Abnormal; Notable for the following components:      Result Value   Glucose, Bld 103 (*)    Creatinine, Ser 1.20  (*)    Albumin 3.0 (*)    GFR, Estimated 41 (*)    All other components within normal limits  PROTIME-INR - Abnormal; Notable for the following components:   Prothrombin Time 16.4 (*)    INR 1.3 (*)    All other components within normal limits  RESP PANEL BY RT-PCR (FLU A&B, COVID) ARPGX2  CULTURE, BLOOD (ROUTINE X 2)  CULTURE, BLOOD (ROUTINE X 2)  URINE CULTURE  LACTIC ACID, PLASMA  CBC WITH DIFFERENTIAL/PLATELET  APTT  LACTIC ACID, PLASMA  URINALYSIS, COMPLETE (UACMP) WITH MICROSCOPIC   EKG ED ECG REPORT I, Naaman Plummer, the attending physician, personally viewed and interpreted this ECG. Date: 08/16/2022 EKG Time: 0921 Rate: 69 Rhythm: normal sinus rhythm QRS Axis: normal Intervals: normal ST/T Wave abnormalities: normal Narrative Interpretation: no evidence of acute ischemia RADIOLOGY ED MD interpretation: X-ray of the left ankle interpreted by me shows improved soft tissue swelling as well as unchanged soft tissue irregularity superficial to the lateral malleolus without any underlying bony erosion -Agree with radiology assessment Official radiology report(s): DG Ankle Complete Left  Result Date: 08/16/2022 CLINICAL DATA:  Chronic wound.  Sepsis alert. EXAM: LEFT ANKLE COMPLETE -  3+ VIEW COMPARISON:  08/10/2022 FINDINGS: Mild soft tissue swelling. This appears improved when compared with 08/10/2022. Soft tissue irregularity superficial to the lateral malleolus with overlying bandage material may reflect wound. No definite underlying bone erosion. No fracture or dislocation. IMPRESSION: 1. Improved soft tissue swelling. 2. Soft tissue irregularity superficial to the lateral malleolus, which may reflect underlying wound or ulcer. No underlying bone erosion. Electronically Signed   By: Kerby Moors M.D.   On: 08/16/2022 09:56   PROCEDURES: Critical Care performed: No .1-3 Lead EKG Interpretation  Performed by: Naaman Plummer, MD Authorized by: Naaman Plummer, MD      Interpretation: normal     ECG rate:  63   ECG rate assessment: normal     Rhythm: sinus rhythm     Ectopy: none     Conduction: normal    MEDICATIONS ORDERED IN ED: Medications  cefTRIAXone (ROCEPHIN) 2 g in sodium chloride 0.9 % 100 mL IVPB (0 g Intravenous Stopped 08/16/22 1002)  sodium chloride 0.9 % bolus 1,000 mL (0 mLs Intravenous Stopped 08/16/22 1138)   IMPRESSION / MDM / ASSESSMENT AND PLAN / ED COURSE  I reviewed the triage vital signs and the nursing notes.                             The patient is on the cardiac monitor to evaluate for evidence of arrhythmia and/or significant heart rate changes. Patient's presentation is most consistent with acute presentation with potential threat to life or bodily function. This patient presents with generalized weakness and fatigue likely secondary to dehydration. Suspect acute kidney injury of prerenal origin. Doubt intrinsic renal dysfunction or obstructive nephropathy. Considered alternate etiologies of the patients symptoms including infectious processes, severe metabolic derangements or electrolyte abnormalities, ischemia/ACS, heart failure, and intracranial/central processes but think these are unlikely given the history and physical exam.  Plan: labs, 2L fluid resuscitation, pain/nausea control, reassessment   FINAL CLINICAL IMPRESSION(S) / ED DIAGNOSES   Final diagnoses:  Generalized weakness  Dehydration   Rx / DC Orders   ED Discharge Orders          Ordered    ondansetron (ZOFRAN-ODT) 4 MG disintegrating tablet  Every 8 hours PRN        08/16/22 1147           Note:  This document was prepared using Dragon voice recognition software and may include unintentional dictation errors.   Naaman Plummer, MD 08/16/22 1150

## 2022-08-16 NOTE — Discharge Instructions (Addendum)
Please use this antinausea medicine at least 30 minutes prior to any antibiotic administration.

## 2022-08-16 NOTE — Sepsis Progress Note (Signed)
eLink is following this Code Sepsis. °

## 2022-08-17 ENCOUNTER — Encounter: Payer: Medicare Other | Admitting: Physician Assistant

## 2022-08-17 DIAGNOSIS — L97828 Non-pressure chronic ulcer of other part of left lower leg with other specified severity: Secondary | ICD-10-CM | POA: Diagnosis not present

## 2022-08-17 NOTE — Progress Notes (Signed)
[name], [name] ([id number]) Visit Report for 08/17/2022 Chief Complaint Document Details Patient Name: [name], [name]. Date of Service: 08/17/2022 10:15 AM Medical Record Number: [id number] Patient Account Number: [id number] Date of Birth/Sex: 09-15-1923 (88 y.o. F) Treating RN: Carlene Coria Primary Care Provider: Adrian Prows Other Clinician: Referring Provider: Adrian Prows Treating Provider/Extender: Skipper Cliche in Treatment: 75 Information Obtained from: Patient Chief Complaint Left leg ulcer and left heel blister/deep tissue injury Electronic Signature(s) Signed: 08/17/2022 10:36:44 AM By: Worthy Keeler PA-C Entered By: Worthy Keeler on 08/17/2022 10:36:44 [name] ([id number]) -------------------------------------------------------------------------------- Physician Orders Details Patient Name: [name] Date of Service: 08/17/2022 10:15 AM Medical Record Number: [id number] Patient Account Number: [id number] Date of Birth/Sex: 02-15-1923 (88 y.o. F) Treating RN: Carlene Coria Primary Care Provider: Adrian Prows Other Clinician: Referring Provider: Adrian Prows Treating Provider/Extender: Skipper Cliche in Treatment: 97 Verbal / Phone Orders: No Diagnosis Coding Follow-up Appointments o Return Appointment in 1 week. [place]: - [place] for wound care. May utilize formulary equivalent dressing for wound treatment orders unless otherwise specified. Home Health Nurse may visit [facility] to address patientos wound care needs. o **Please direct any NON-WOUND related issues/requests for orders to patient's Primary Care Physician. **If current dressing causes regression in wound condition, may D/C ordered dressing product/s and apply Normal Saline Moist Dressing daily until next [place] or Other MD appointment. **Notify [facility] of regression  in wound condition at [phone]. o ADMIT to Home Health for wound care. May utilize formulary equivalent dressing for wound treatment orders unless otherwise specified. Home Health Nurse may visit [facility] to address patientos wound care needs. Bathing/ Shower/ Hygiene o May shower with wound dressing protected with water repellent cover or cast protector. o No tub bath. Anesthetic (Use 'Patient Medications' Section for Anesthetic Order Entry) o Lidocaine applied to wound bed Edema Control - Lymphedema / Segmental Compressive Device / Other o Tubigrip single layer applied. - Tubi D single layer left lower leg o Elevate, Exercise Daily and Avoid Standing for Long Periods of Time. o Elevate legs to the level of the heart and pump ankles as often as possible o Elevate leg(s) parallel to the floor when sitting. Wound Treatment Wound #11 - Calcaneus Wound Laterality: Left Cleanser: Byram Ancillary Kit - 15 Day Supply (Generic) 1 x Per Day/30 Days Discharge Instructions: Use supplies as instructed; Kit contains: (15) Saline Bullets; (15) 3x3 Gauze; 15 pr Gloves Topical: Santyl Collagenase Ointment, 30 (gm), tube 1 x Per Day/30 Days Discharge Instructions: apply nickel thick to wound bed only Primary Dressing: Xeroform 4x4-HBD (in/in) (Generic) 1 x Per Day/30 Days Secondary Dressing: ABD Pad 5x9 (in/in) (Generic) 1 x Per Day/30 Days Discharge Instructions: Cover with ABD pad/heel cup Secondary Dressing: Gauze 1 x Per Day/30 Days Secured With: Kerlix Roll Sterile or Non-Sterile 6-ply 4.5x4 (yd/yd) 1 x Per Day/30 Days Discharge Instructions: Apply Kerlix as directed Secured With: Tubigrip Size D, 3x10 (in/yd) 1 x Per Day/30 Days Discharge Instructions: single layer Wound #12 - Ankle Wound Laterality: Left, Lateral Cleanser: Byram Ancillary Kit - 15 Day Supply (Generic) 1 x Per Day/30 Days Discharge Instructions: Use supplies as instructed; Kit contains: (15) Saline Bullets; (15)  3x3 Gauze; 15 pr Gloves Topical: Santyl Collagenase Ointment, 30 (gm), tube 1 x Per Day/30 Days Discharge Instructions: apply nickel thick to wound bed only Primary Dressing: Xeroform 4x4-HBD (in/in) (Dispense As Written) 1 x Per Day/30 Days [name] ([id number]) Secondary Dressing: ABD Pad 5x9 (in/in) (Generic) 1 x Per Day/30 Days Discharge Instructions: Cover with ABD pad Secondary Dressing: Gauze 1 x Per Day/30 Days Secured With: Medipore Tape - 12M Medipore H Soft Cloth Surgical Tape, 2x2 (in/yd) (Generic) 1 x Per Day/30 Days Secured With: Kerlix Roll Sterile or Non-Sterile 6-ply 4.5x4 (yd/yd) 1 x Per Day/30 Days Discharge Instructions: Apply Kerlix as directed Secured With: Tubigrip Size D, 3x10 (in/yd) 1 x Per Day/30 Days Discharge Instructions: single layer Electronic Signature(s) Unsigned Entered ByCarlene Coria on 08/17/2022 10:48:11 Signature(s): Date(s): [name] ([id number]) -------------------------------------------------------------------------------- Problem List Details Patient Name: [name] Date of Service: 08/17/2022 10:15 AM Medical Record Number: [id number] Patient Account Number: [id number] Date of Birth/Sex: 1923-02-25 (88 y.o. F) Treating RN: Carlene Coria Primary Care Provider: Adrian Prows Other Clinician: Referring Provider: Adrian Prows Treating Provider/Extender: Skipper Cliche in Treatment: 40 Active Problems ICD-10 Encounter Code Description Active Date MDM Diagnosis L97.828 Non-pressure chronic ulcer of other part of left lower leg with other 06/27/2022 No Yes specified severity S80.12XD Contusion of left lower leg, subsequent encounter 06/27/2022 No Yes L89.623 Pressure ulcer of left heel, stage 3 07/13/2022 No Yes I89.0 Lymphedema, not elsewhere classified 05/13/2021 No Yes I87.2 Venous insufficiency (chronic) (peripheral) 05/13/2021 No Yes Z95.0 Presence of cardiac pacemaker 05/13/2021 No Yes Z79.01 Long  term (current) use of anticoagulants 05/13/2021 No Yes Inactive Problems ICD-10 Code Description Active Date Inactive Date 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, stage 3 05/13/2021 05/13/2021 S81.811A Laceration without foreign body, right lower leg, initial encounter 05/25/2022 05/25/2022 S80.12XA Contusion of left lower leg, initial encounter 11/10/2021 11/10/2021 Resolved Problems Electronic Signature(s) Signed: 08/17/2022 10:36:39 AM By: Worthy Keeler PA-C Entered By: Worthy Keeler on 08/17/2022 10:36:39 [place], [name] ([id number]) [name] Fears, [name] ([id number]) -------------------------------------------------------------------------------- SuperBill Details Patient Name: [name] Date of Service: 08/17/2022 Medical Record Number: [id number] Patient Account Number: [id number] Date of Birth/Sex: 11-Jan-1923 (88 y.o. F) Treating RN: Carlene Coria Primary Care Provider: Adrian Prows Other Clinician: Referring Provider: Adrian Prows Treating Provider/Extender: Skipper Cliche in Treatment: 10 Diagnosis Coding ICD-10 Codes Code Description [phone] Non-pressure chronic ulcer of other part of left lower leg with other specified severity S80.12XD Contusion of left lower leg, subsequent encounter L89.623 Pressure ulcer of left heel, stage 3 I89.0 Lymphedema, not elsewhere classified I87.2 Venous insufficiency (chronic) (peripheral) Z95.0 Presence of cardiac pacemaker Z79.01 Long term (current) use of anticoagulants Facility Procedures CPT4 Code: [id number] Description: 99213 - WOUND CARE VISIT-LEV 3 EST PT Modifier: Quantity: 1 Electronic Signature(s) Unsigned Entered ByCarlene Coria on 08/17/2022 10:49:24 Signature(s): Date(s):

## 2022-08-18 NOTE — Progress Notes (Signed)
[name], [name] ([id number]) Visit Report for 08/17/2022 Arrival Information Details Patient Name: [name], [name] Date of Service: 08/17/2022 10:15 AM Medical Record Number: [id number] Patient Account Number: [id number] Date of Birth/Sex: [birth year] (88 y.o. F) Treating RN: Carlene Coria Primary Care Zailee Vallely: Adrian Prows Other Clinician: Referring Jennice Renegar: Adrian Prows Treating Terena Bohan/Extender: Skipper Cliche in Treatment: 85 Visit Information History Since Last Visit All ordered tests and consults were completed: No Patient Arrived: Wheel Chair Added or deleted any medications: No Arrival Time: 10:13 Any new allergies or adverse reactions: No Accompanied By: daughter Had a fall or experienced change in No Transfer Assistance: None activities of daily living that may affect Patient Identification Verified: Yes risk of falls: Secondary Verification Process Completed: Yes Signs or symptoms of abuse/neglect since last visito No Patient Requires Transmission-Based No Hospitalized since last visit: No Precautions: Implantable device outside of the clinic excluding No Patient Has Alerts: Yes cellular tissue based products placed in the center Patient Alerts: Patient on Blood since last visit: Thinner Has Dressing in Place as Prescribed: Yes NOT DIABETIC Has Compression in Place as Prescribed: Yes ***ELIQUIS*** Pain Present Now: No Electronic Signature(s) Signed: 08/18/2022 12:07:54 PM By: Carlene Coria RN Entered By: Carlene Coria on 08/17/2022 10:23:29 [name] ([id number]) -------------------------------------------------------------------------------- Clinic Level of Care Assessment Details Patient Name: [name] Date of Service: 08/17/2022 10:15 AM Medical Record Number: [id number] Patient Account Number: [id number] Date of Birth/Sex: 1923/11/07 (88 y.o. F) Treating RN: Carlene Coria Primary Care Floraine Buechler: Adrian Prows Other  Clinician: Referring Kristell Wooding: Adrian Prows Treating Reco Shonk/Extender: Skipper Cliche in Treatment: 31 Clinic Level of Care Assessment Items TOOL 4 Quantity Score X - Use when only an EandM is performed on FOLLOW-UP visit 1 0 ASSESSMENTS - Nursing Assessment / Reassessment X - Reassessment of Co-morbidities (includes updates in patient status) 1 10 X- 1 5 Reassessment of Adherence to Treatment Plan ASSESSMENTS - Wound and Skin Assessment / Reassessment _0  - Simple Wound Assessment / Reassessment - one wound 0 X- 2 5 Complex Wound Assessment / Reassessment - multiple wounds _1  - 0 Dermatologic / Skin Assessment (not related to wound area) ASSESSMENTS - Focused Assessment _2  - Circumferential Edema Measurements - multi extremities 0 _3  - 0 Nutritional Assessment / Counseling / Intervention _4  - 0 Lower Extremity Assessment (monofilament, tuning fork, pulses) _5  - 0 Peripheral Arterial Disease Assessment (using hand held doppler) ASSESSMENTS - Ostomy and/or Continence Assessment and Care _6  - Incontinence Assessment and Management 0 _7  - 0 Ostomy Care Assessment and Management (repouching, etc.) PROCESS - Coordination of Care X - Simple Patient / Family Education for ongoing care 1 15 _8  - 0 Complex (extensive) Patient / Family Education for ongoing care X- 1 10 Staff obtains Programmer, systems, Records, Test Results / Process Orders _9  - 0 Staff telephones HHA, Nursing Homes / Clarify orders / etc _10  - 0 Routine Transfer to another Facility (non-emergent condition) _11  - 0 Routine Hospital Admission (non-emergent condition) _12  - 0 New Admissions / Biomedical engineer / Ordering NPWT, Apligraf, etc. _13  - 0 Emergency Hospital Admission (emergent condition) X- 1 10 Simple Discharge Coordination _14  - 0 Complex (extensive) Discharge Coordination PROCESS - Special Needs _15  - Pediatric / Minor Patient Management 0 _16  - 0 Isolation Patient Management _17  - 0 Hearing /  Language / Visual special needs _18  - 0 Assessment of Community assistance (transportation, D/C planning, etc.) _19  - 0 Additional assistance / Altered mentation _20  - 0 Support Surface(s) Assessment (bed, cushion, seat, etc.) INTERVENTIONS -  Wound Cleansing / Measurement MAKAELYN, [name] ([id number]) _0  - 0 Simple Wound Cleansing - one wound X- 2 5 Complex Wound Cleansing - multiple wounds X- 1 5 Wound Imaging (photographs - any number of wounds) _1  - 0 Wound Tracing (instead of photographs) _2  - 0 Simple Wound Measurement - one wound X- 2 5 Complex Wound Measurement - multiple wounds INTERVENTIONS - Wound Dressings X - Small Wound Dressing one or multiple wounds 2 10 _3  - 0 Medium Wound Dressing one or multiple wounds _4  - 0 Large Wound Dressing one or multiple wounds X- 1 5 Application of Medications - topical <WUJWJXBJYNWGNFAO>_1<\/HYQMVHQIONGEXBMW>_4  - 0 Application of Medications - injection INTERVENTIONS - Miscellaneous _6  - External ear exam 0 _7  - 0 Specimen Collection (cultures, biopsies, blood, body fluids, etc.) _8  - 0 Specimen(s) / Culture(s) sent or taken to Lab for analysis _9  - 0 Patient Transfer (multiple staff / Civil Service fast streamer / Similar devices) _10  - 0 Simple Staple / Suture removal (25 or less) _11  - 0 Complex Staple / Suture removal (26 or more) _12  - 0 Hypo / Hyperglycemic Management (close monitor of Blood Glucose) _13  - 0 Ankle / Brachial Index (ABI) - do not check if billed separately X- 1 5 Vital Signs Has the patient been seen at the hospital within the last three years: Yes Total Score: 115 Level Of Care: New/Established - Level 3 Electronic Signature(s) Signed: 08/18/2022 12:07:54 PM By: Carlene Coria RN Entered By: Carlene Coria on 08/17/2022 10:49:01 Breithaupt, [name] ([id number]) -------------------------------------------------------------------------------- Encounter Discharge Information Details Patient Name: [name] Date of Service: 08/17/2022 10:15 AM Medical  Record Number: [id number] Patient Account Number: [id number] Date of Birth/Sex: 04-Oct-1923 (88 y.o. F) Treating RN: Carlene Coria Primary Care Ravin Denardo: Adrian Prows Other Clinician: Referring Mikhayla Phillis: Adrian Prows Treating Sears Oran/Extender: Skipper Cliche in Treatment: 78 Encounter Discharge Information Items Discharge Condition: Stable Ambulatory Status: Wheelchair Discharge Destination: Home Transportation: Private Auto Accompanied By: daughter Schedule Follow-up Appointment: Yes Clinical Summary of Care: Electronic Signature(s) Signed: 08/18/2022 12:07:54 PM By: Carlene Coria RN Entered By: Carlene Coria on 08/17/2022 11:13:39 Brinson, [name] ([id number]) -------------------------------------------------------------------------------- Lower Extremity Assessment Details Patient Name: [name] Date of Service: 08/17/2022 10:15 AM Medical Record Number: [id number] Patient Account Number: [id number] Date of Birth/Sex: 1923-08-15 (88 y.o. F) Treating RN: Carlene Coria Primary Care Calina Patrie: Adrian Prows Other Clinician: Referring Rutger Salton: Adrian Prows Treating Traevion Poehler/Extender: Skipper Cliche in Treatment: 65 Edema Assessment Assessed: [Left: No] [Right: No] Edema: [Left: N] [Right: o] Calf Left: Right: Point of Measurement: 33 cm From Medial Instep 26 cm Ankle Left: Right: Point of Measurement: 12 cm From Medial Instep 18 cm Vascular Assessment Pulses: Dorsalis Pedis Palpable: [Left:Yes] Electronic Signature(s) Signed: 08/18/2022 12:07:54 PM By: Carlene Coria RN Entered By: Carlene Coria on 08/17/2022 10:32:47 Raucci, [name] ([id number]) -------------------------------------------------------------------------------- Multi Wound Chart Details Patient Name: [name] Date of Service: 08/17/2022 10:15 AM Medical Record Number: [id number] Patient Account Number: [id number] Date of Birth/Sex: 06/18/1923 (88 y.o. F) Treating  RN: Carlene Coria Primary Care Cheria Sadiq: Adrian Prows Other Clinician: Referring Lilyauna Miedema: Adrian Prows Treating Seanne Chirico/Extender: Skipper Cliche in Treatment: 48 Vital Signs Height(in): 20 Pulse(bpm): 65 Weight(lbs): 150 Blood Pressure(mmHg): 125/71 Body Mass Index(BMI): 27.4 Temperature(F): 97.6 Respiratory Rate(breaths/min): 18 Photos: [N/A:N/A] Wound Location: Left Calcaneus Left, Lateral Ankle N/A Wounding Event: Pressure Injury Gradually Appeared N/A Primary Etiology: Pressure Ulcer Venous Leg Ulcer N/A Comorbid History: Cataracts, Congestive Heart Failure, Cataracts, Congestive Heart Failure, N/A Hypertension, Peripheral Arterial Hypertension, Peripheral Arterial Disease,  Osteoarthritis, Neuropathy Disease, Osteoarthritis, Neuropathy Date Acquired: 07/20/2022 07/17/2022 N/A Weeks of Treatment: 4 4 N/A Wound Status: Open Open N/A Wound Recurrence: No No N/A Measurements L x W x D (cm) 5x1.5x0.1 8x2.9x0.2 N/A Area (cm) : 5.89 18.221 N/A Volume (cm) : 0.589 3.644 N/A % Reduction in Area: 81.30% -11505.70% N/A % Reduction in Volume: 81.30% -22675.00% N/A Classification: Category/Stage III Full Thickness Without Exposed N/A Support Structures Exudate Amount: Small Medium N/A Exudate Type: Serosanguineous Serosanguineous N/A Exudate Color: red, brown red, brown N/A Granulation Amount: Small (1-33%) Medium (34-66%) N/A Granulation Quality: Pink Red N/A Necrotic Amount: Large (67-100%) Medium (34-66%) N/A Necrotic Tissue: Eschar N/A N/A Exposed Structures: Fat Layer (Subcutaneous Tissue): Fat Layer (Subcutaneous Tissue): N/A Yes Yes Fascia: No Fascia: No Tendon: No Tendon: No Muscle: No Muscle: No Joint: No Joint: No Bone: No Bone: No Epithelialization: Small (1-33%) None N/A Treatment Notes Electronic Signature(s) Signed: 08/18/2022 12:07:54 PM By: Carlene Coria RN Entered By: Carlene Coria on 08/17/2022 10:33:04 Stroble, [name]  ([id number]) [place]inas, [name] ([id number]) -------------------------------------------------------------------------------- [place] Details Patient Name: [name] Date of Service: 08/17/2022 10:15 AM Medical Record Number: [id number] Patient Account Number: [id number] Date of Birth/Sex: Jan 10, 1923 (88 y.o. F) Treating RN: Carlene Coria Primary Care Kacelyn Rowzee: Adrian Prows Other Clinician: Referring Antonio Creswell: Adrian Prows Treating Braelynn Benning/Extender: Skipper Cliche in Treatment: 63 Active Inactive Electronic Signature(s) Signed: 08/18/2022 12:07:54 PM By: Carlene Coria RN Entered By: Carlene Coria on 08/17/2022 10:32:53 Biggar, [name] ([id number]) -------------------------------------------------------------------------------- Pain Assessment Details Patient Name: [name] Date of Service: 08/17/2022 10:15 AM Medical Record Number: [id number] Patient Account Number: [id number] Date of Birth/Sex: August 24, 1923 (88 y.o. F) Treating RN: Carlene Coria Primary Care Lanijah Warzecha: Adrian Prows Other Clinician: Referring Irwin Toran: Adrian Prows Treating Nickalous Stingley/Extender: Skipper Cliche in Treatment: 73 Active Problems Location of Pain Severity and Description of Pain Patient Has Paino No Site Locations Pain Management and Medication Current Pain Management: Electronic Signature(s) Signed: 08/18/2022 12:07:54 PM By: Carlene Coria RN Entered By: Carlene Coria on 08/17/2022 10:23:54 Guadamuz, [name] ([id number]) -------------------------------------------------------------------------------- Patient/Caregiver Education Details Patient Name: [name] Date of Service: 08/17/2022 10:15 AM Medical Record Number: [id number] Patient Account Number: [id number] Date of Birth/Gender: October 20, 1923 (88 y.o. F) Treating RN: Carlene Coria Primary Care Physician: Adrian Prows Other Clinician: Referring Physician: Adrian Prows Treating Physician/Extender: Skipper Cliche in Treatment: 75 Education Assessment Education Provided To: Patient Education Topics Provided Wound/Skin Impairment: Methods: Explain/Verbal Responses: State content correctly Electronic Signature(s) Signed: 08/18/2022 12:07:54 PM By: Carlene Coria RN Entered By: Carlene Coria on 08/17/2022 10:49:35 Arambula, [name] ([id number]) -------------------------------------------------------------------------------- Wound Assessment Details Patient Name: [name] Date of Service: 08/17/2022 10:15 AM Medical Record Number: [id number] Patient Account Number: [id number] Date of Birth/Sex: 11/08/1923 (88 y.o. F) Treating RN: Carlene Coria Primary Care Katie Moch: Adrian Prows Other Clinician: Referring Karina Nofsinger: Adrian Prows Treating Antoin Dargis/Extender: Skipper Cliche in Treatment: 65 Wound Status Wound Number: 11 Primary Pressure Ulcer Etiology: Wound Location: Left Calcaneus Wound Open Wounding Event: Pressure Injury Status: Date Acquired: 07/20/2022 Comorbid Cataracts, Congestive Heart Failure, Hypertension, Weeks Of Treatment: 4 History: Peripheral Arterial Disease, Osteoarthritis, Neuropathy Clustered Wound: No Photos Wound Measurements Length: (cm) 5 Width: (cm) 1.5 Depth: (cm) 0.1 Area: (cm) 5.89 Volume: (cm) 0.589 % Reduction in Area: 81.3% % Reduction in Volume: 81.3% Epithelialization: Small (1-33%) Tunneling: No Undermining: No Wound Description Classification: Category/Stage III Exudate Amount: Small Exudate Type: Serosanguineous Exudate Color: red, brown Foul Odor After Cleansing: No Slough/Fibrino Yes Wound Bed Granulation  Amount: Small (1-33%) Exposed Structure Granulation Quality: Pink Fascia Exposed: No Necrotic Amount: Large (67-100%) Fat Layer (Subcutaneous Tissue) Exposed: Yes Necrotic Quality: Eschar Tendon Exposed: No Muscle Exposed: No Joint Exposed: No Bone Exposed:  No Treatment Notes Wound #11 (Calcaneus) Wound Laterality: Left Cleanser [place]ram Ancillary Kit - 15 Day Supply Discharge Instruction: Use supplies as instructed; Kit contains: (15) Saline Bullets; (15) 3x3 Gauze; 15 pr Gloves Peri-Wound Care [place], [name] ([id number]) Topical Santyl Collagenase Ointment, 30 (gm), tube Discharge Instruction: apply nickel thick to wound bed only Primary Dressing Xeroform 4x4-HBD (in/in) Secondary Dressing ABD Pad 5x9 (in/in) Discharge Instruction: Cover with ABD pad/heel cup Gauze Secured With Kerlix Roll Sterile or Non-Sterile 6-ply 4.5x4 (yd/yd) Discharge Instruction: Apply Kerlix as directed Tubigrip Size D, 3x10 (in/yd) Discharge Instruction: single layer Compression Wrap Compression Stockings Add-Ons Electronic Signature(s) Signed: 08/18/2022 12:07:54 PM By: Carlene Coria RN Entered By: Carlene Coria on 08/17/2022 10:31:21 Merrick, [name] ([id number]) -------------------------------------------------------------------------------- Wound Assessment Details Patient Name: [name] Date of Service: 08/17/2022 10:15 AM Medical Record Number: [id number] Patient Account Number: [id number] Date of Birth/Sex: [birth year] (88 y.o. F) Treating RN: Carlene Coria Primary Care Cicely Ortner: Adrian Prows Other Clinician: Referring Geramy Lamorte: Adrian Prows Treating Arjan Strohm/Extender: Skipper Cliche in Treatment: 65 Wound Status Wound Number: 12 Primary Venous Leg Ulcer Etiology: Wound Location: Left, Lateral Ankle Wound Open Wounding Event: Gradually Appeared Status: Date Acquired: 07/17/2022 Comorbid Cataracts, Congestive Heart Failure, Hypertension, Weeks Of Treatment: 4 History: Peripheral Arterial Disease, Osteoarthritis, Neuropathy Clustered Wound: No Photos Wound Measurements Length: (cm) 8 Width: (cm) 2.9 Depth: (cm) 0.2 Area: (cm) 18.221 Volume: (cm) 3.644 % Reduction in Area: -11505.7% % Reduction in Volume:  -22675% Epithelialization: None Tunneling: No Undermining: No Wound Description Classification: Full Thickness Without Exposed Support Structures Exudate Amount: Medium Exudate Type: Serosanguineous Exudate Color: red, brown Foul Odor After Cleansing: No Slough/Fibrino Yes Wound Bed Granulation Amount: Medium (34-66%) Exposed Structure Granulation Quality: Red Fascia Exposed: No Necrotic Amount: Medium (34-66%) Fat Layer (Subcutaneous Tissue) Exposed: Yes Tendon Exposed: No Muscle Exposed: No Joint Exposed: No Bone Exposed: No Treatment Notes Wound #12 (Ankle) Wound Laterality: Left, Lateral Cleanser Byram Ancillary Kit - 15 Day Supply Discharge Instruction: Use supplies as instructed; Kit contains: (15) Saline Bullets; (15) 3x3 Gauze; 15 pr Gloves Peri-Wound Care Gotts, [name] ([id number]) Topical Santyl Collagenase Ointment, 30 (gm), tube Discharge Instruction: apply nickel thick to wound bed only Primary Dressing Xeroform 4x4-HBD (in/in) Secondary Dressing ABD Pad 5x9 (in/in) Discharge Instruction: Cover with ABD pad Gauze Secured With Medipore Tape - 63M Medipore H Soft Cloth Surgical Tape, 2x2 (in/yd) Kerlix Roll Sterile or Non-Sterile 6-ply 4.5x4 (yd/yd) Discharge Instruction: Apply Kerlix as directed Tubigrip Size D, 3x10 (in/yd) Discharge Instruction: single layer Compression Wrap Compression Stockings Add-Ons Electronic Signature(s) Signed: 08/18/2022 12:07:54 PM By: Carlene Coria RN Entered By: Carlene Coria on 08/17/2022 10:31:48 Asencio, [name] ([id number]) -------------------------------------------------------------------------------- Vitals Details Patient Name: [name] Date of Service: 08/17/2022 10:15 AM Medical Record Number: [id number] Patient Account Number: [id number] Date of Birth/Sex: 07/13/1923 (88 y.o. F) Treating RN: Carlene Coria Primary Care Mylia Pondexter: Adrian Prows Other Clinician: Referring Nicola Quesnell: Adrian Prows Treating Ruby Dilone/Extender: Skipper Cliche in Treatment: 32 Vital Signs Time Taken: 10:20 Temperature (F): 97.6 Height (in): 62 Pulse (bpm): 58 Weight (lbs): 150 Respiratory Rate (breaths/min): 18 Body Mass Index (BMI): 27.4 Blood Pressure (mmHg): 125/71 Reference Range: 80 - 120 mg / dl Electronic Signature(s) Signed: 08/18/2022 12:07:54 PM By: Carlene Coria RN Entered By: Carlene Coria on 08/17/2022  10:23:47

## 2022-08-21 LAB — CULTURE, BLOOD (ROUTINE X 2)
Culture: NO GROWTH
Culture: NO GROWTH
Special Requests: ADEQUATE

## 2022-08-24 ENCOUNTER — Ambulatory Visit: Payer: Medicare Other | Admitting: Physician Assistant

## 2022-08-31 ENCOUNTER — Ambulatory Visit: Payer: Medicare Other | Admitting: Physician Assistant

## 2022-09-07 ENCOUNTER — Ambulatory Visit: Payer: Medicare Other | Admitting: Physician Assistant

## 2022-09-07 ENCOUNTER — Ambulatory Visit: Payer: Medicare Other | Admitting: Podiatry

## 2022-09-14 ENCOUNTER — Encounter: Payer: Medicare Other | Admitting: Physician Assistant

## 2022-09-21 ENCOUNTER — Encounter: Payer: Medicare Other | Admitting: Physician Assistant

## 2022-09-28 ENCOUNTER — Encounter: Payer: Medicare Other | Admitting: Physician Assistant

## 2022-10-04 DEATH — deceased

## 2022-11-06 IMAGING — CT CT ABD-PELV W/ CM
1 of 3 series · 11 of 32 positions shown, 16 images · IV contrast (omnipaque)
Comparison: 05/21/2020

CLINICAL DATA: Left abdominal pain for 3 days.

EXAM:
CT ABDOMEN AND PELVIS WITH CONTRAST
TECHNIQUE: Multidetector CT imaging of the abdomen and pelvis was performed
using the standard protocol following bolus administration of
intravenous contrast.
CONTRAST:  80mL OMNIPAQUE IOHEXOL 300 MG/ML  SOLN

[Series 2: axial st · axial · 0.76mm/px · z∈[-924,-554]mm · 11 of 84 slices shown, 16 images]
[im 5/84  soft-tissue]
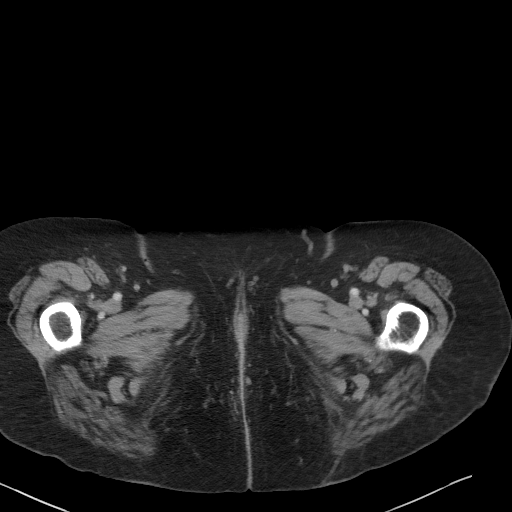
[im 5/84  bone]
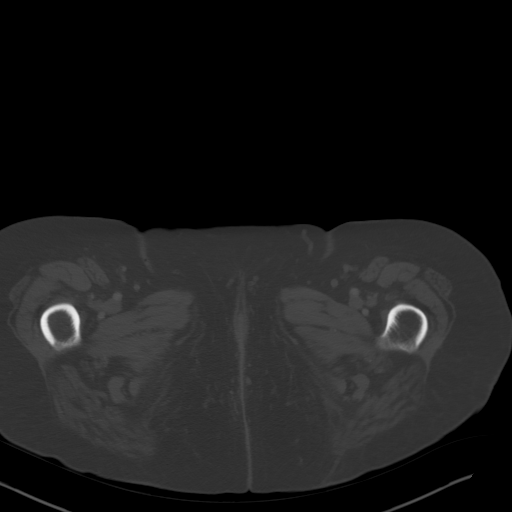
[im 15/84  soft-tissue]
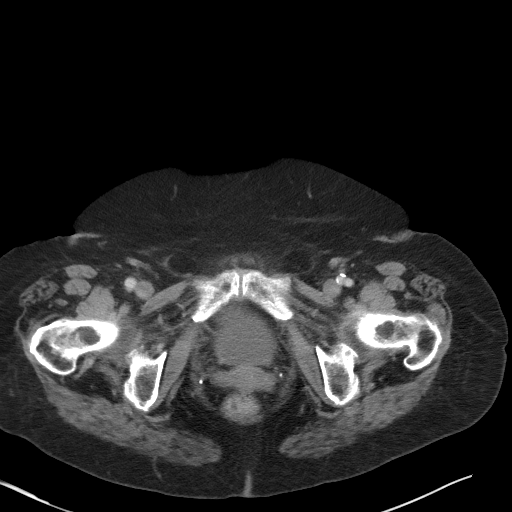
[im 25/84  soft-tissue]
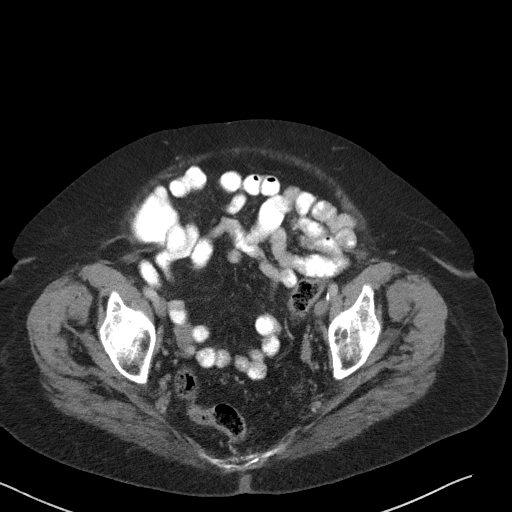
[im 30/84  soft-tissue]
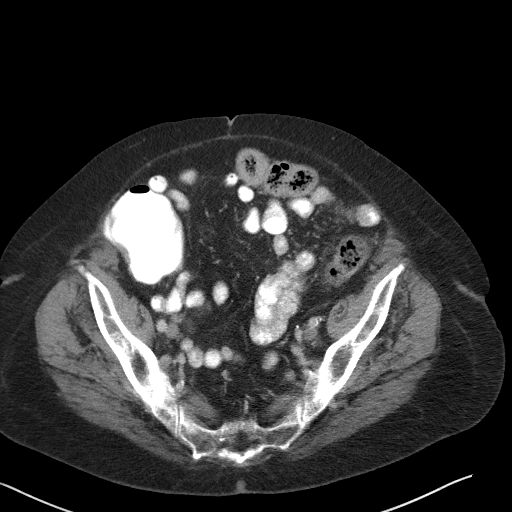
[im 40/84  soft-tissue]
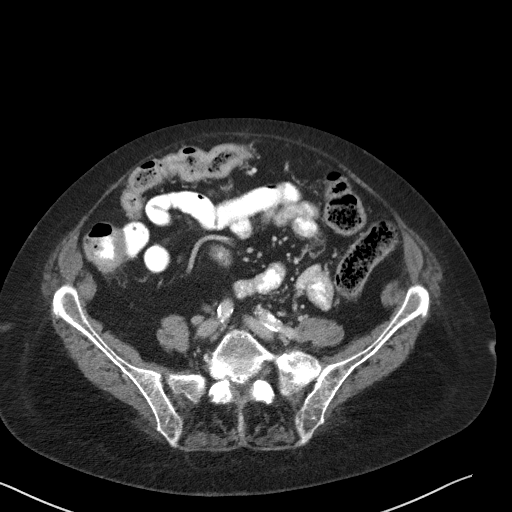
[im 44/84  soft-tissue]
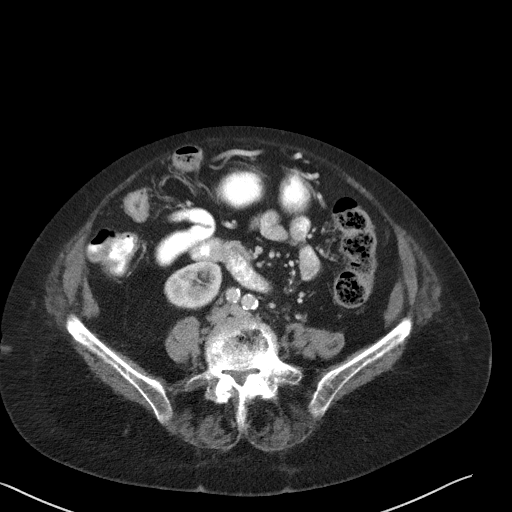
[im 54/84  soft-tissue]
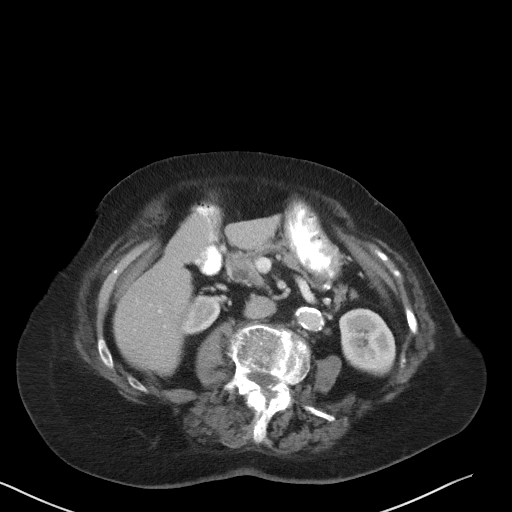
[im 64/84  soft-tissue]
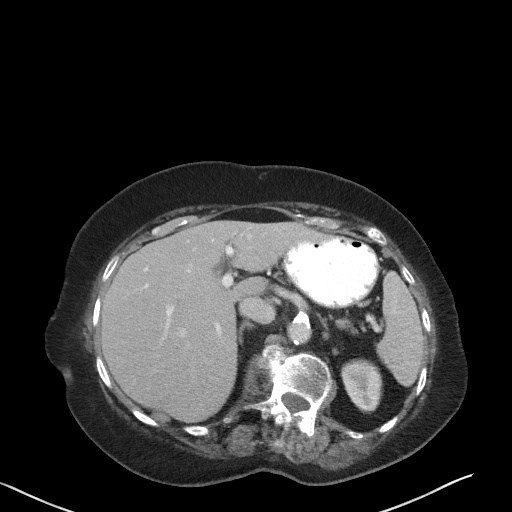
[im 64/84  lung]
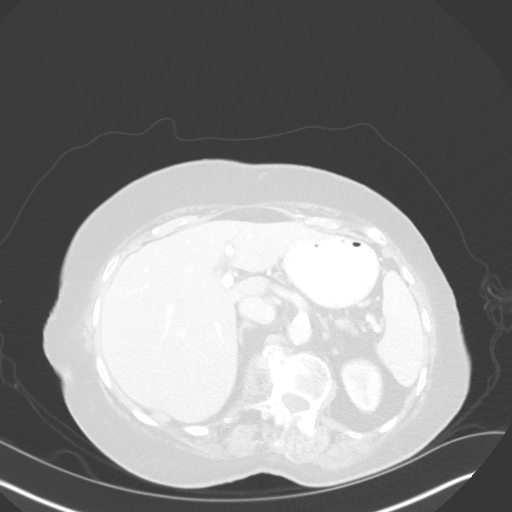
[im 69/84  soft-tissue]
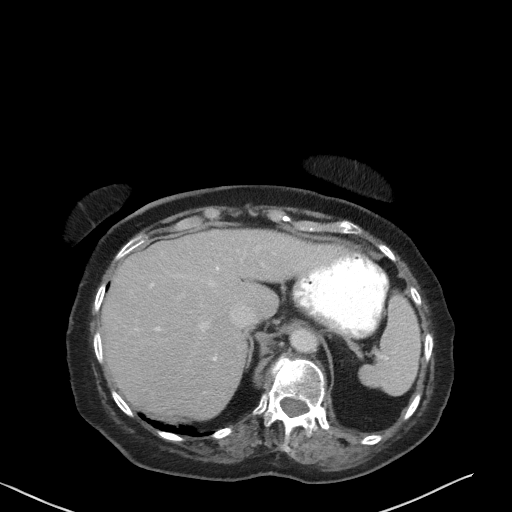
[im 69/84  lung]
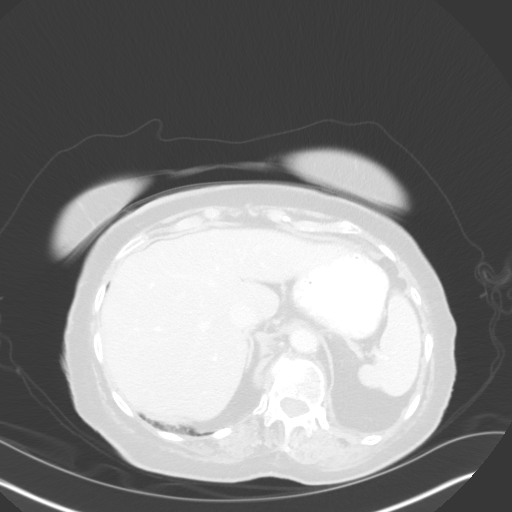
[im 69/84  bone]
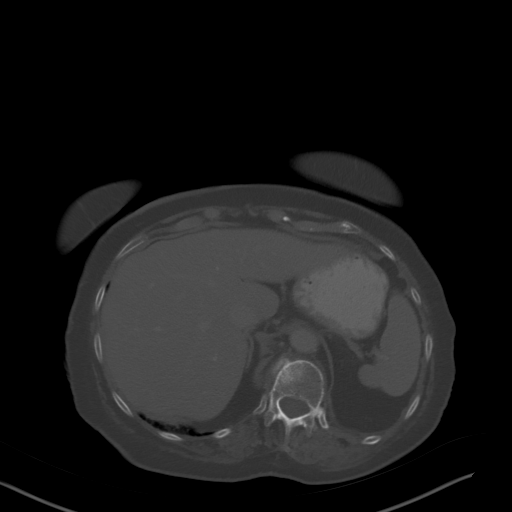
[im 74/84  lung]
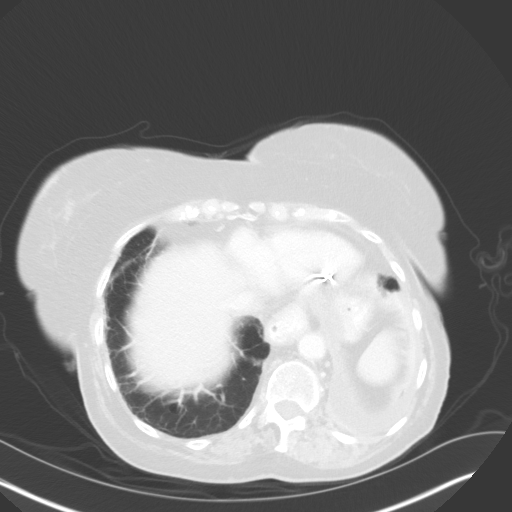
[im 79/84  soft-tissue]
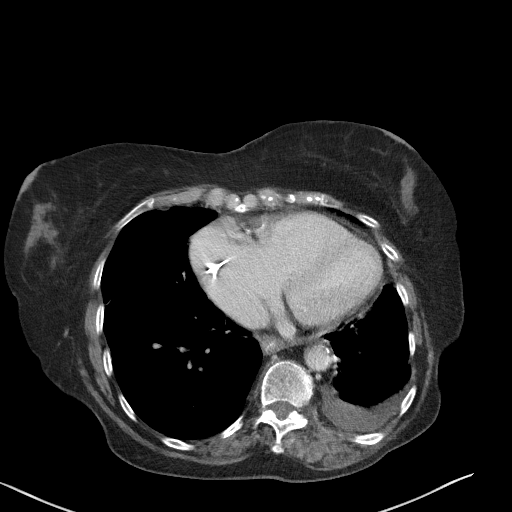
[im 79/84  lung]
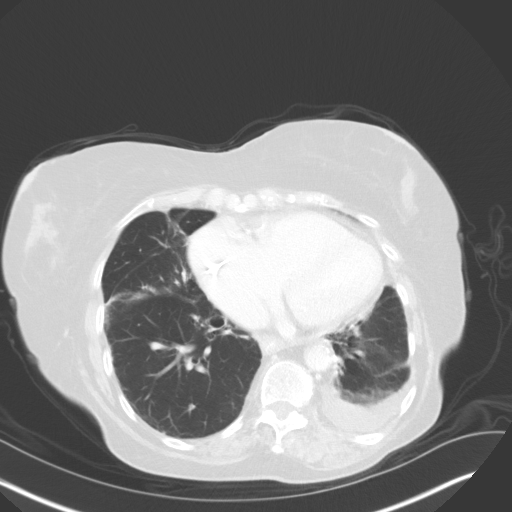

[11 of 32 positions shown; findings below may reference images not displayed]

FINDINGS: Lower chest: Increased calcification and increased soft tissue
lobularity inferiorly in the right breast compared to the CT chest
from 07/13/2016, significance uncertain but possibly warranting
mammographic follow up.

Mild cardiomegaly. Contrast medium in the distal esophagus
suggesting reflux or dysmotility. Small type 1 hiatal hernia. Pacer
leads noted. Descending thoracic aortic atherosclerotic
calcification. Small left pleural effusion is new compared to
05/21/2020, with possible enhancement along the parietal pleural
margin on image 12 series 2, cannot exclude exudative effusion.
Increased atelectasis along the lingula. Stable scarring and volume
loss in the right middle lobe and along the right hemidiaphragm.

Hepatobiliary: Punctate calcifications in the liver compatible with
old granulomatous disease.

0.7 cm hypodense lesion along the inferior right hepatic lobe edge
on image 39 series 4. This is not changed from 05/21/2020 and is
most likely benign but technically nonspecific.

Gallbladder surgically absent.

Pancreas: 1.0 cm low conspicuity fluid density lesion along the
upper margin of the pancreas on image 45 series 4. Not appreciable
on 10/12/2010, retrospectively stable from 05/21/2020.

Spleen: Hypodense splenic lesions are unchanged and likely benign
although technically nonspecific.

Adrenals/Urinary Tract: The urinary bladder extends 0.8 cm below the
pubococcygeal line compatible with cystocele. Stable 1.1 by 1.0 cm
hypodense lesion in the left mid kidney on image 19 of series 7,
likely a cyst but technically nonspecific. Similar 0.5 cm lesion in
the right mid kidney posteriorly. Adrenal glands unremarkable.

Stomach/Bowel: Small type 1 hiatal hernia.

Indirect right inguinal hernia contains a loop of small bowel
without strangulation or obstruction.

Borderline appearance for wall thickening in the transverse colon
probably mostly secondary to nondistention. There are few scattered
colonic diverticula.

Vascular/Lymphatic: Aortoiliac atherosclerotic vascular disease.
Marked atherosclerotic calcification of the proximal SMA, likely
with occluded or near occluded proximal segment but with distal
reconstitution. No pathologic adenopathy identified.

Reproductive: Unremarkable

Other: No supplemental non-categorized findings.

Musculoskeletal: In addition to the small indirect right inguinal
hernia, there is adipose tissue in the left groin region raising the
possibility of a small left groin hernia containing omental adipose
tissue.

Levoconvex lower thoracic and upper lumbar scoliosis with
substantial rotary component. Lumbar spondylosis and degenerative
disc disease.
IMPRESSION: 1. Small left pleural effusion is new compared to 05/21/2020, with
possible enhancement along the parietal pleural margin.
2. Indirect right inguinal hernia contains a loop of small bowel
without strangulation or obstruction.
3. Small left groin hernia containing omental adipose tissue.
4. Increased calcification and increased soft tissue lobularity
inferiorly in the right breast compared to the CT chest from
07/13/2016, significance uncertain but potentially warranting
mammographic follow up to exclude the possibility of breast cancer.
5. 1.0 cm cystic lesion of the pancreatic tail, stable from
05/21/2020. Possibilities include indolent intraductal papillary
mucinous neoplasm or a postinflammatory cystic lesion. Based on
current guidelines, consider follow up pancreatic protocol CT or MRI
in 2 years time. This recommendation follows ACR consensus
guidelines: Management of Incidental Pancreatic Cysts: A White Paper
of the ACR Incidental Findings Committee. [HOSPITAL]
5795;[DATE].
6. Contrast medium in the distal esophagus suggesting reflux or
dysmotility.
7. Mild cardiomegaly.
8. Small type 1 hiatal hernia.
9. Marked atherosclerotic calcification of the proximal SMA, likely
with chronic occluded or near occluded proximal segment but with
distal reconstitution.
10. Levoconvex lower thoracic and upper lumbar scoliosis with
substantial rotary component. Lumbar spondylosis and degenerative
disc disease.
11. Aortic atherosclerosis.

Aortic Atherosclerosis ([id number]-JL2.2).
